# Patient Record
Sex: Female | Born: 1960 | Race: Black or African American | Hispanic: No | Smoking: Former smoker
Health system: Southern US, Community
[De-identification: ages and names within clinical notes are randomized; demographics above are authoritative.]

## PROBLEM LIST (undated history)

## (undated) ENCOUNTER — Emergency Department (HOSPITAL_BASED_OUTPATIENT_CLINIC_OR_DEPARTMENT_OTHER): Admission: EM

## (undated) ENCOUNTER — Inpatient Hospital Stay: Admission: EM | Payer: Self-pay | Source: Home / Self Care

## (undated) DIAGNOSIS — C419 Malignant neoplasm of bone and articular cartilage, unspecified: Secondary | ICD-10-CM

## (undated) DIAGNOSIS — J9859 Other diseases of mediastinum, not elsewhere classified: Secondary | ICD-10-CM

## (undated) DIAGNOSIS — K589 Irritable bowel syndrome without diarrhea: Secondary | ICD-10-CM

## (undated) DIAGNOSIS — R3915 Urgency of urination: Secondary | ICD-10-CM

## (undated) DIAGNOSIS — R002 Palpitations: Secondary | ICD-10-CM

## (undated) DIAGNOSIS — M2550 Pain in unspecified joint: Secondary | ICD-10-CM

## (undated) DIAGNOSIS — G459 Transient cerebral ischemic attack, unspecified: Secondary | ICD-10-CM

## (undated) DIAGNOSIS — I209 Angina pectoris, unspecified: Secondary | ICD-10-CM

## (undated) DIAGNOSIS — R079 Chest pain, unspecified: Secondary | ICD-10-CM

## (undated) DIAGNOSIS — G709 Myoneural disorder, unspecified: Secondary | ICD-10-CM

## (undated) DIAGNOSIS — E079 Disorder of thyroid, unspecified: Secondary | ICD-10-CM

## (undated) DIAGNOSIS — K219 Gastro-esophageal reflux disease without esophagitis: Secondary | ICD-10-CM

## (undated) DIAGNOSIS — J189 Pneumonia, unspecified organism: Secondary | ICD-10-CM

## (undated) DIAGNOSIS — I2699 Other pulmonary embolism without acute cor pulmonale: Secondary | ICD-10-CM

## (undated) DIAGNOSIS — C801 Malignant (primary) neoplasm, unspecified: Secondary | ICD-10-CM

## (undated) DIAGNOSIS — I10 Essential (primary) hypertension: Secondary | ICD-10-CM

## (undated) DIAGNOSIS — D689 Coagulation defect, unspecified: Secondary | ICD-10-CM

## (undated) DIAGNOSIS — M1990 Unspecified osteoarthritis, unspecified site: Secondary | ICD-10-CM

## (undated) DIAGNOSIS — E669 Obesity, unspecified: Secondary | ICD-10-CM

## (undated) DIAGNOSIS — R0602 Shortness of breath: Secondary | ICD-10-CM

## (undated) DIAGNOSIS — R350 Frequency of micturition: Secondary | ICD-10-CM

## (undated) DIAGNOSIS — M069 Rheumatoid arthritis, unspecified: Secondary | ICD-10-CM

## (undated) DIAGNOSIS — H409 Unspecified glaucoma: Secondary | ICD-10-CM

## (undated) DIAGNOSIS — K5900 Constipation, unspecified: Secondary | ICD-10-CM

## (undated) DIAGNOSIS — R600 Localized edema: Secondary | ICD-10-CM

## (undated) DIAGNOSIS — R0600 Dyspnea, unspecified: Secondary | ICD-10-CM

## (undated) DIAGNOSIS — T7840XA Allergy, unspecified, initial encounter: Secondary | ICD-10-CM

## (undated) DIAGNOSIS — F419 Anxiety disorder, unspecified: Secondary | ICD-10-CM

## (undated) DIAGNOSIS — M549 Dorsalgia, unspecified: Secondary | ICD-10-CM

## (undated) DIAGNOSIS — I2510 Atherosclerotic heart disease of native coronary artery without angina pectoris: Secondary | ICD-10-CM

## (undated) DIAGNOSIS — E049 Nontoxic goiter, unspecified: Secondary | ICD-10-CM

## (undated) DIAGNOSIS — E739 Lactose intolerance, unspecified: Secondary | ICD-10-CM

## (undated) DIAGNOSIS — E559 Vitamin D deficiency, unspecified: Secondary | ICD-10-CM

## (undated) DIAGNOSIS — F329 Major depressive disorder, single episode, unspecified: Secondary | ICD-10-CM

## (undated) DIAGNOSIS — I509 Heart failure, unspecified: Secondary | ICD-10-CM

## (undated) DIAGNOSIS — F32A Depression, unspecified: Secondary | ICD-10-CM

## (undated) DIAGNOSIS — I499 Cardiac arrhythmia, unspecified: Secondary | ICD-10-CM

## (undated) DIAGNOSIS — G4730 Sleep apnea, unspecified: Secondary | ICD-10-CM

## (undated) DIAGNOSIS — Z8711 Personal history of peptic ulcer disease: Secondary | ICD-10-CM

## (undated) DIAGNOSIS — E785 Hyperlipidemia, unspecified: Secondary | ICD-10-CM

## (undated) DIAGNOSIS — F1990 Other psychoactive substance use, unspecified, uncomplicated: Secondary | ICD-10-CM

## (undated) DIAGNOSIS — J811 Chronic pulmonary edema: Secondary | ICD-10-CM

## (undated) HISTORY — DX: Localized edema: R60.0

## (undated) HISTORY — DX: Transient cerebral ischemic attack, unspecified: G45.9

## (undated) HISTORY — DX: Shortness of breath: R06.02

## (undated) HISTORY — DX: Constipation, unspecified: K59.00

## (undated) HISTORY — DX: Allergy, unspecified, initial encounter: T78.40XA

## (undated) HISTORY — DX: Dorsalgia, unspecified: M54.9

## (undated) HISTORY — PX: TONSILLECTOMY: SUR1361

## (undated) HISTORY — DX: Chest pain, unspecified: R07.9

## (undated) HISTORY — DX: Palpitations: R00.2

## (undated) HISTORY — DX: Lactose intolerance, unspecified: E73.9

## (undated) HISTORY — DX: Personal history of peptic ulcer disease: Z87.11

## (undated) HISTORY — DX: Myoneural disorder, unspecified: G70.9

## (undated) HISTORY — PX: LEG SURGERY: SHX1003

## (undated) HISTORY — DX: Rheumatoid arthritis, unspecified: M06.9

## (undated) HISTORY — DX: Disorder of thyroid, unspecified: E07.9

## (undated) HISTORY — DX: Hyperlipidemia, unspecified: E78.5

## (undated) HISTORY — DX: Coagulation defect, unspecified: D68.9

## (undated) HISTORY — PX: APPENDECTOMY: SHX54

## (undated) HISTORY — DX: Vitamin D deficiency, unspecified: E55.9

## (undated) HISTORY — DX: Unspecified glaucoma: H40.9

## (undated) HISTORY — DX: Pain in unspecified joint: M25.50

## (undated) HISTORY — DX: Pneumonia, unspecified organism: J18.9

## (undated) HISTORY — DX: Sleep apnea, unspecified: G47.30

## (undated) HISTORY — DX: Irritable bowel syndrome, unspecified: K58.9

## (undated) HISTORY — DX: Other psychoactive substance use, unspecified, uncomplicated: F19.90

## (undated) HISTORY — PX: CARDIAC CATHETERIZATION: SHX172

---

## 1987-04-25 HISTORY — PX: TUBAL LIGATION: SHX77

## 1997-09-25 ENCOUNTER — Ambulatory Visit (HOSPITAL_COMMUNITY): Admission: RE | Admit: 1997-09-25 | Discharge: 1997-09-25 | Payer: Self-pay | Admitting: Family Medicine

## 1997-12-23 ENCOUNTER — Emergency Department (HOSPITAL_COMMUNITY): Admission: EM | Admit: 1997-12-23 | Discharge: 1997-12-23 | Payer: Self-pay | Admitting: Emergency Medicine

## 1998-04-15 ENCOUNTER — Encounter: Admission: RE | Admit: 1998-04-15 | Discharge: 1998-05-03 | Payer: Self-pay | Admitting: Internal Medicine

## 1998-04-28 ENCOUNTER — Ambulatory Visit (HOSPITAL_COMMUNITY): Admission: RE | Admit: 1998-04-28 | Discharge: 1998-04-28 | Payer: Self-pay | Admitting: Internal Medicine

## 1998-04-28 ENCOUNTER — Encounter: Payer: Self-pay | Admitting: Internal Medicine

## 1999-11-04 ENCOUNTER — Ambulatory Visit (HOSPITAL_COMMUNITY): Admission: RE | Admit: 1999-11-04 | Discharge: 1999-11-04 | Payer: Self-pay | Admitting: Internal Medicine

## 1999-11-04 ENCOUNTER — Encounter: Payer: Self-pay | Admitting: Internal Medicine

## 1999-12-25 ENCOUNTER — Emergency Department (HOSPITAL_COMMUNITY): Admission: EM | Admit: 1999-12-25 | Discharge: 1999-12-25 | Payer: Self-pay | Admitting: Emergency Medicine

## 1999-12-30 ENCOUNTER — Other Ambulatory Visit: Admission: RE | Admit: 1999-12-30 | Discharge: 1999-12-30 | Payer: Self-pay | Admitting: Family Medicine

## 2000-06-22 ENCOUNTER — Encounter: Payer: Self-pay | Admitting: Family Medicine

## 2000-06-22 ENCOUNTER — Ambulatory Visit (HOSPITAL_COMMUNITY): Admission: RE | Admit: 2000-06-22 | Discharge: 2000-06-22 | Payer: Self-pay | Admitting: Family Medicine

## 2000-06-22 ENCOUNTER — Ambulatory Visit (HOSPITAL_COMMUNITY): Admission: RE | Admit: 2000-06-22 | Discharge: 2000-06-22 | Payer: Self-pay | Admitting: *Deleted

## 2000-08-16 ENCOUNTER — Ambulatory Visit (HOSPITAL_COMMUNITY): Admission: RE | Admit: 2000-08-16 | Discharge: 2000-08-16 | Payer: Self-pay | Admitting: Family Medicine

## 2000-10-06 ENCOUNTER — Encounter: Payer: Self-pay | Admitting: Emergency Medicine

## 2000-10-06 ENCOUNTER — Inpatient Hospital Stay (HOSPITAL_COMMUNITY): Admission: EM | Admit: 2000-10-06 | Discharge: 2000-10-07 | Payer: Self-pay | Admitting: Emergency Medicine

## 2000-10-07 ENCOUNTER — Encounter: Payer: Self-pay | Admitting: Internal Medicine

## 2000-10-16 ENCOUNTER — Encounter: Admission: RE | Admit: 2000-10-16 | Discharge: 2000-10-16 | Payer: Self-pay | Admitting: Internal Medicine

## 2001-01-29 ENCOUNTER — Encounter: Payer: Self-pay | Admitting: Thoracic Surgery

## 2001-01-29 ENCOUNTER — Encounter: Admission: RE | Admit: 2001-01-29 | Discharge: 2001-01-29 | Payer: Self-pay | Admitting: Thoracic Surgery

## 2001-04-06 ENCOUNTER — Emergency Department (HOSPITAL_COMMUNITY): Admission: EM | Admit: 2001-04-06 | Discharge: 2001-04-07 | Payer: Self-pay | Admitting: Emergency Medicine

## 2001-04-06 ENCOUNTER — Encounter: Payer: Self-pay | Admitting: Emergency Medicine

## 2001-04-11 ENCOUNTER — Ambulatory Visit (HOSPITAL_COMMUNITY): Admission: RE | Admit: 2001-04-11 | Discharge: 2001-04-11 | Payer: Self-pay | Admitting: Family Medicine

## 2001-04-11 ENCOUNTER — Encounter: Payer: Self-pay | Admitting: Family Medicine

## 2001-07-23 ENCOUNTER — Ambulatory Visit (HOSPITAL_BASED_OUTPATIENT_CLINIC_OR_DEPARTMENT_OTHER): Admission: RE | Admit: 2001-07-23 | Discharge: 2001-07-23 | Payer: Self-pay | Admitting: Family Medicine

## 2001-07-31 ENCOUNTER — Encounter: Admission: RE | Admit: 2001-07-31 | Discharge: 2001-07-31 | Payer: Self-pay | Admitting: Thoracic Surgery

## 2001-07-31 ENCOUNTER — Encounter: Payer: Self-pay | Admitting: Thoracic Surgery

## 2001-09-25 ENCOUNTER — Encounter: Payer: Self-pay | Admitting: Obstetrics and Gynecology

## 2001-09-25 ENCOUNTER — Inpatient Hospital Stay (HOSPITAL_COMMUNITY): Admission: AD | Admit: 2001-09-25 | Discharge: 2001-09-25 | Payer: Self-pay | Admitting: Obstetrics and Gynecology

## 2001-10-01 ENCOUNTER — Encounter: Admission: RE | Admit: 2001-10-01 | Discharge: 2001-10-01 | Payer: Self-pay | Admitting: *Deleted

## 2001-11-18 ENCOUNTER — Inpatient Hospital Stay (HOSPITAL_COMMUNITY): Admission: RE | Admit: 2001-11-18 | Discharge: 2001-11-20 | Payer: Self-pay | Admitting: Obstetrics and Gynecology

## 2001-11-18 ENCOUNTER — Encounter (INDEPENDENT_AMBULATORY_CARE_PROVIDER_SITE_OTHER): Payer: Self-pay | Admitting: Specialist

## 2001-12-05 ENCOUNTER — Encounter: Admission: RE | Admit: 2001-12-05 | Discharge: 2001-12-05 | Payer: Self-pay | Admitting: Obstetrics and Gynecology

## 2001-12-24 ENCOUNTER — Encounter: Admission: RE | Admit: 2001-12-24 | Discharge: 2001-12-24 | Payer: Self-pay | Admitting: *Deleted

## 2002-01-29 ENCOUNTER — Encounter: Payer: Self-pay | Admitting: Thoracic Surgery

## 2002-01-29 ENCOUNTER — Encounter: Admission: RE | Admit: 2002-01-29 | Discharge: 2002-01-29 | Payer: Self-pay | Admitting: Thoracic Surgery

## 2002-04-27 ENCOUNTER — Emergency Department (HOSPITAL_COMMUNITY): Admission: EM | Admit: 2002-04-27 | Discharge: 2002-04-27 | Payer: Self-pay | Admitting: Emergency Medicine

## 2002-04-28 ENCOUNTER — Encounter: Payer: Self-pay | Admitting: Emergency Medicine

## 2002-06-05 ENCOUNTER — Encounter: Admission: RE | Admit: 2002-06-05 | Discharge: 2002-06-05 | Payer: Self-pay | Admitting: Obstetrics and Gynecology

## 2002-06-22 ENCOUNTER — Emergency Department (HOSPITAL_COMMUNITY): Admission: EM | Admit: 2002-06-22 | Discharge: 2002-06-22 | Payer: Self-pay | Admitting: Emergency Medicine

## 2002-07-22 ENCOUNTER — Encounter: Payer: Self-pay | Admitting: Family Medicine

## 2002-07-22 ENCOUNTER — Ambulatory Visit (HOSPITAL_COMMUNITY): Admission: RE | Admit: 2002-07-22 | Discharge: 2002-07-22 | Payer: Self-pay | Admitting: Family Medicine

## 2002-08-07 ENCOUNTER — Encounter: Admission: RE | Admit: 2002-08-07 | Discharge: 2002-08-07 | Payer: Self-pay | Admitting: Obstetrics and Gynecology

## 2002-08-12 ENCOUNTER — Encounter: Payer: Self-pay | Admitting: Thoracic Surgery

## 2002-08-12 ENCOUNTER — Encounter: Admission: RE | Admit: 2002-08-12 | Discharge: 2002-08-12 | Payer: Self-pay | Admitting: Thoracic Surgery

## 2002-12-25 ENCOUNTER — Emergency Department (HOSPITAL_COMMUNITY): Admission: EM | Admit: 2002-12-25 | Discharge: 2002-12-25 | Payer: Self-pay | Admitting: *Deleted

## 2003-02-13 ENCOUNTER — Encounter: Payer: Self-pay | Admitting: Internal Medicine

## 2003-02-13 ENCOUNTER — Ambulatory Visit (HOSPITAL_COMMUNITY): Admission: RE | Admit: 2003-02-13 | Discharge: 2003-02-13 | Payer: Self-pay | Admitting: Internal Medicine

## 2003-02-19 ENCOUNTER — Encounter: Admission: RE | Admit: 2003-02-19 | Discharge: 2003-02-19 | Payer: Self-pay | Admitting: Thoracic Surgery

## 2003-04-16 ENCOUNTER — Ambulatory Visit (HOSPITAL_COMMUNITY): Admission: RE | Admit: 2003-04-16 | Discharge: 2003-04-16 | Payer: Self-pay | Admitting: Nurse Practitioner

## 2003-04-25 HISTORY — PX: ABDOMINAL HYSTERECTOMY: SHX81

## 2003-05-25 ENCOUNTER — Ambulatory Visit (HOSPITAL_COMMUNITY): Admission: RE | Admit: 2003-05-25 | Discharge: 2003-05-25 | Payer: Self-pay | Admitting: Family Medicine

## 2003-07-15 ENCOUNTER — Emergency Department (HOSPITAL_COMMUNITY): Admission: EM | Admit: 2003-07-15 | Discharge: 2003-07-15 | Payer: Self-pay | Admitting: Emergency Medicine

## 2003-07-17 ENCOUNTER — Emergency Department (HOSPITAL_COMMUNITY): Admission: EM | Admit: 2003-07-17 | Discharge: 2003-07-17 | Payer: Self-pay | Admitting: Emergency Medicine

## 2003-07-23 ENCOUNTER — Emergency Department (HOSPITAL_COMMUNITY): Admission: EM | Admit: 2003-07-23 | Discharge: 2003-07-23 | Payer: Self-pay | Admitting: Family Medicine

## 2003-08-13 ENCOUNTER — Encounter: Admission: RE | Admit: 2003-08-13 | Discharge: 2003-10-09 | Payer: Self-pay | Admitting: Orthopedic Surgery

## 2003-08-20 ENCOUNTER — Encounter: Admission: RE | Admit: 2003-08-20 | Discharge: 2003-08-20 | Payer: Self-pay | Admitting: Thoracic Surgery

## 2003-08-25 ENCOUNTER — Encounter: Admission: RE | Admit: 2003-08-25 | Discharge: 2003-08-25 | Payer: Self-pay | Admitting: Orthopedic Surgery

## 2003-09-09 ENCOUNTER — Encounter: Admission: RE | Admit: 2003-09-09 | Discharge: 2003-09-09 | Payer: Self-pay | Admitting: Orthopedic Surgery

## 2003-09-14 ENCOUNTER — Ambulatory Visit (HOSPITAL_COMMUNITY): Admission: RE | Admit: 2003-09-14 | Discharge: 2003-09-14 | Payer: Self-pay | Admitting: Family Medicine

## 2003-10-09 ENCOUNTER — Encounter: Admission: RE | Admit: 2003-10-09 | Discharge: 2004-01-07 | Payer: Self-pay | Admitting: Orthopedic Surgery

## 2004-02-01 ENCOUNTER — Ambulatory Visit: Payer: Self-pay | Admitting: Family Medicine

## 2004-03-03 ENCOUNTER — Encounter: Admission: RE | Admit: 2004-03-03 | Discharge: 2004-03-03 | Payer: Self-pay | Admitting: Thoracic Surgery

## 2004-04-01 ENCOUNTER — Ambulatory Visit: Payer: Self-pay | Admitting: Family Medicine

## 2004-04-01 ENCOUNTER — Other Ambulatory Visit: Admission: RE | Admit: 2004-04-01 | Discharge: 2004-04-01 | Payer: Self-pay | Admitting: Family Medicine

## 2004-05-31 ENCOUNTER — Ambulatory Visit: Payer: Self-pay | Admitting: Family Medicine

## 2004-06-21 ENCOUNTER — Ambulatory Visit: Payer: Self-pay | Admitting: Family Medicine

## 2004-07-14 ENCOUNTER — Ambulatory Visit: Payer: Self-pay | Admitting: Obstetrics and Gynecology

## 2004-07-17 ENCOUNTER — Emergency Department (HOSPITAL_COMMUNITY): Admission: EM | Admit: 2004-07-17 | Discharge: 2004-07-17 | Payer: Self-pay | Admitting: Family Medicine

## 2004-08-10 ENCOUNTER — Ambulatory Visit: Payer: Self-pay | Admitting: Family Medicine

## 2004-08-12 ENCOUNTER — Ambulatory Visit: Payer: Self-pay | Admitting: Family Medicine

## 2004-08-15 ENCOUNTER — Ambulatory Visit (HOSPITAL_COMMUNITY): Admission: RE | Admit: 2004-08-15 | Discharge: 2004-08-15 | Payer: Self-pay | Admitting: Family Medicine

## 2004-08-15 ENCOUNTER — Ambulatory Visit: Payer: Self-pay | Admitting: Family Medicine

## 2004-08-25 ENCOUNTER — Ambulatory Visit: Payer: Self-pay | Admitting: Family Medicine

## 2004-08-31 ENCOUNTER — Emergency Department (HOSPITAL_COMMUNITY): Admission: EM | Admit: 2004-08-31 | Discharge: 2004-08-31 | Payer: Self-pay | Admitting: Family Medicine

## 2004-10-06 ENCOUNTER — Ambulatory Visit: Payer: Self-pay | Admitting: Family Medicine

## 2004-10-18 ENCOUNTER — Ambulatory Visit: Payer: Self-pay | Admitting: Family Medicine

## 2004-10-24 ENCOUNTER — Ambulatory Visit: Payer: Self-pay | Admitting: Family Medicine

## 2004-10-28 ENCOUNTER — Ambulatory Visit: Payer: Self-pay | Admitting: Family Medicine

## 2004-11-04 ENCOUNTER — Ambulatory Visit: Payer: Self-pay | Admitting: Internal Medicine

## 2004-12-16 ENCOUNTER — Emergency Department (HOSPITAL_COMMUNITY): Admission: EM | Admit: 2004-12-16 | Discharge: 2004-12-16 | Payer: Self-pay | Admitting: Emergency Medicine

## 2004-12-24 IMAGING — CT CT CHEST W/ CM
1 of 2 series · 14 of 29 positions shown, 18 images · IV contrast (omnipaque)
Comparison: none

CLINICAL DATA: Follow up of anterior mediastinal mass.  Con v14.8, 493.9. 
 CT CHEST W/CONTRAST
 Multidetector helical scans through the chest were performed after IV contrast media was given.  222cc of Omnipaque 300 were given as the contrast media to this patient with a history of asthma.  This scan is compared to the prior CT from [REDACTED] dated 08/12/02.  
 The rounded anterior superior mediastinal soft tissue mass is stable measuring 20 x 18mm.  No mediastinal or hilar adenopathy is seen.  No axillary adenopathy is noted.  The pulmonary arteries and thoracic aorta opacify normally.  There is suggestion of mild fatty infiltration of the liver.  On lung window images no lung parenchymal lesion is seen.  No effusion is noted. 
 IMPRESSION
 Stable anterior superior mediastinal soft tissue mass when compared to prior study.  No adenopathy.

[Series 2: — · axial · 0.62mm/px · z∈[-294,-44]mm · 14 of 59 slices shown, 18 images]
[im 5/59  mediastinal]
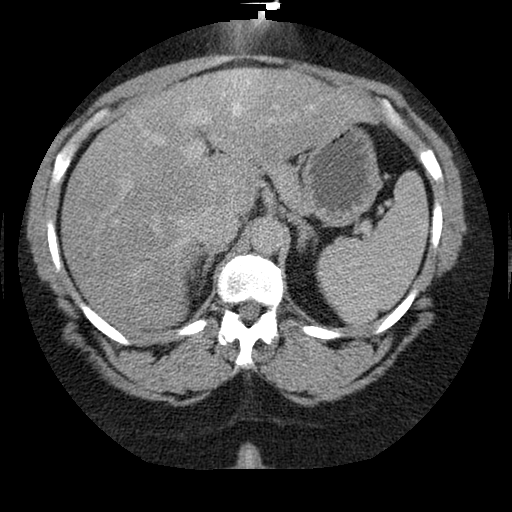
[im 5/59  lung]
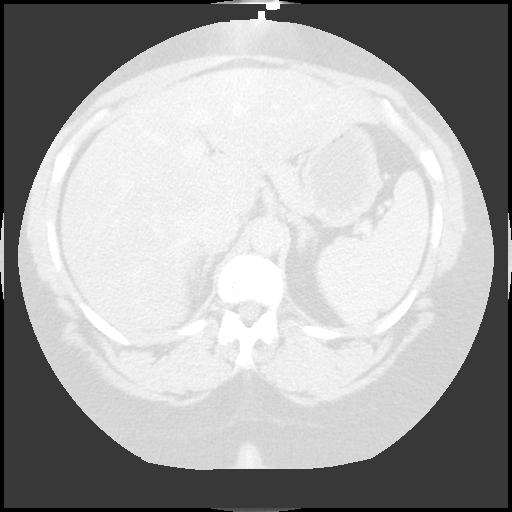
[im 9/59  lung]
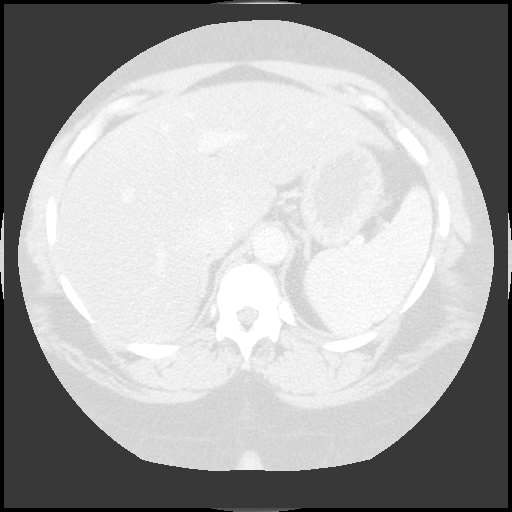
[im 13/59  lung]
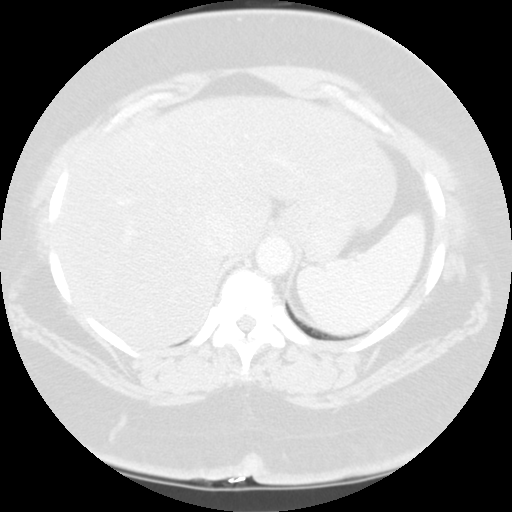
[im 17/59  lung]
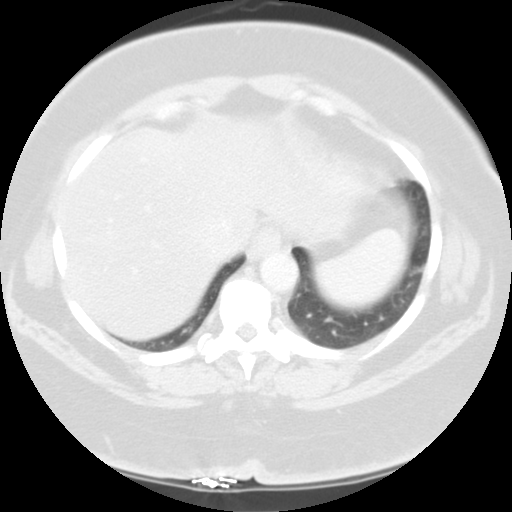
[im 21/59  mediastinal]
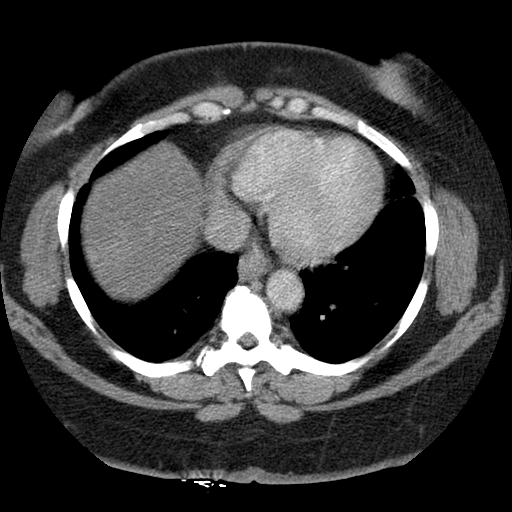
[im 21/59  lung]
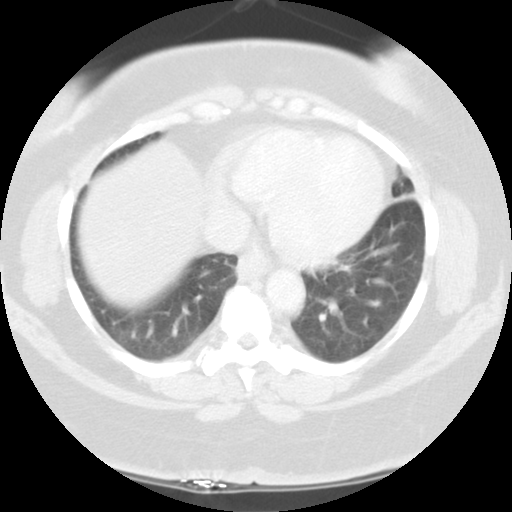
[im 25/59  lung]
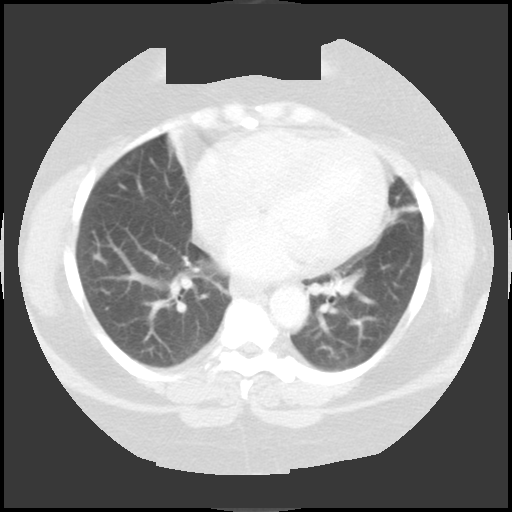
[im 29/59  lung]
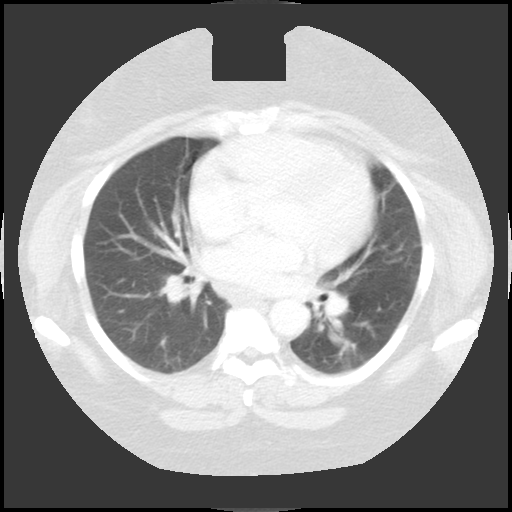
[im 30/59  lung]
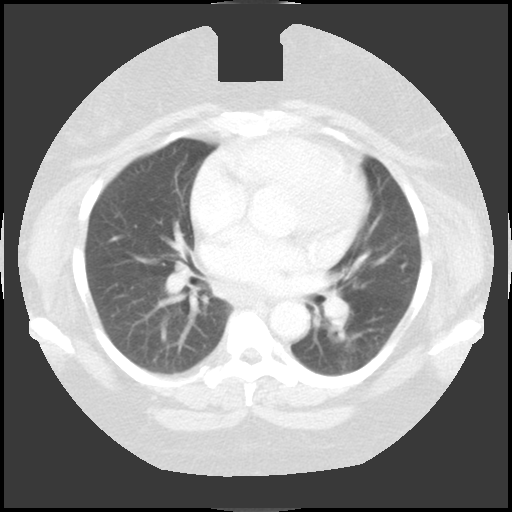
[im 34/59  mediastinal]
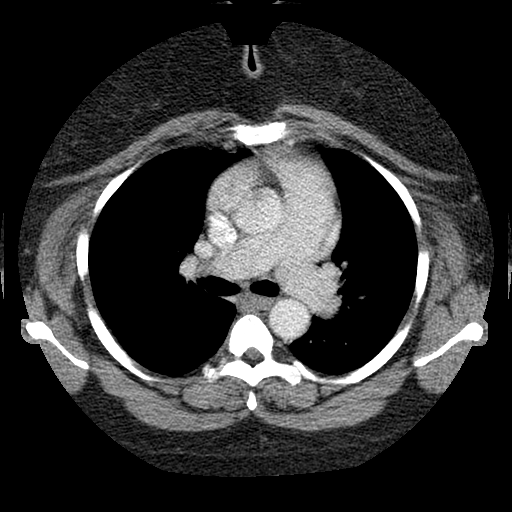
[im 34/59  lung]
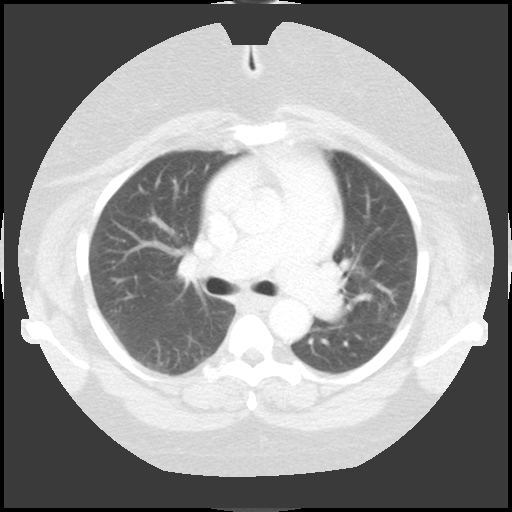
[im 38/59  lung]
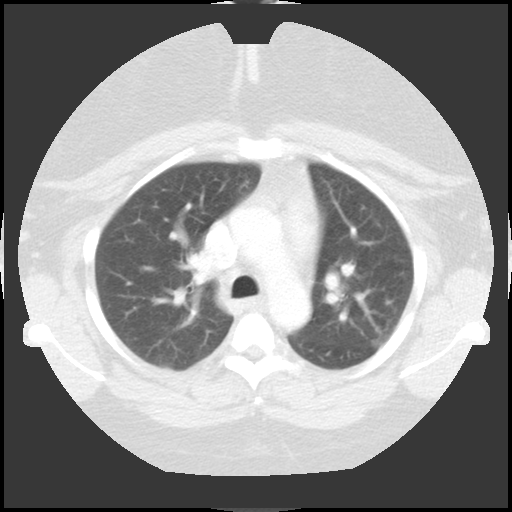
[im 42/59  lung]
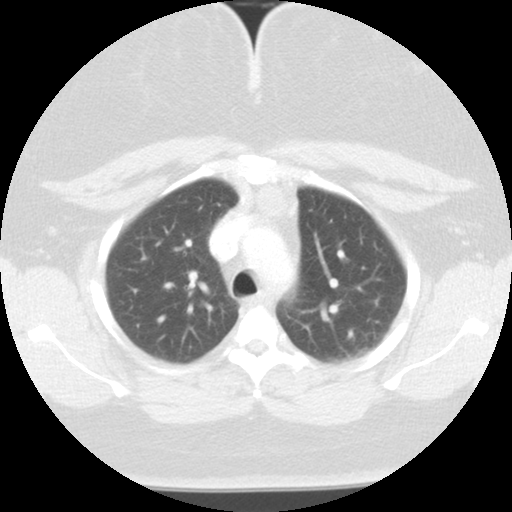
[im 46/59  lung]
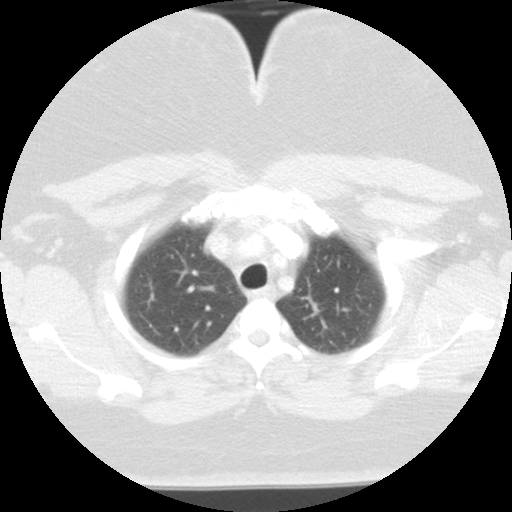
[im 50/59  mediastinal]
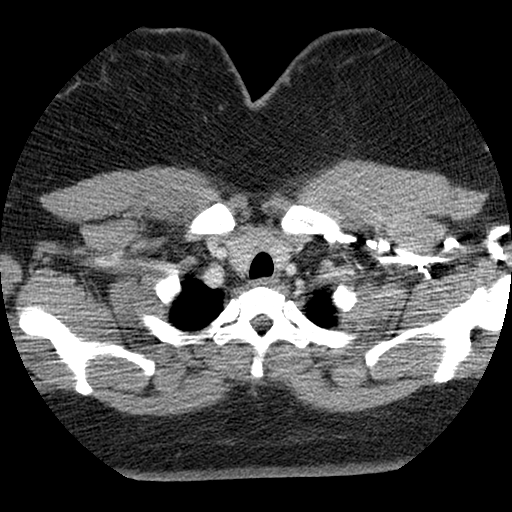
[im 50/59  lung]
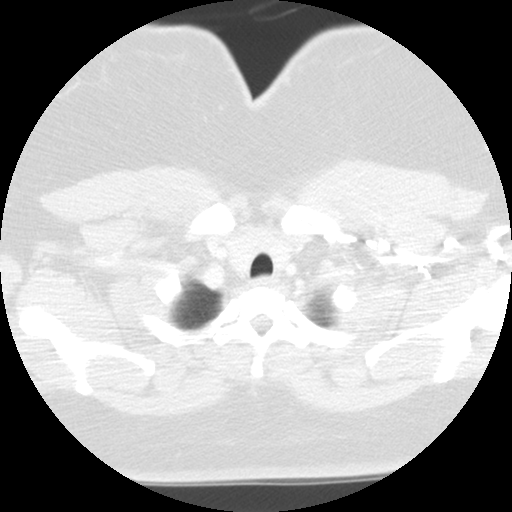
[im 54/59  lung]
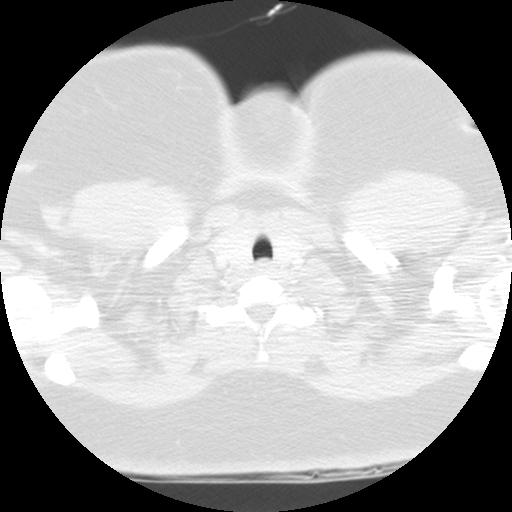

[14 of 29 positions shown; findings below may reference images not displayed]

## 2005-01-27 ENCOUNTER — Emergency Department (HOSPITAL_COMMUNITY): Admission: EM | Admit: 2005-01-27 | Discharge: 2005-01-27 | Payer: Self-pay | Admitting: Emergency Medicine

## 2005-02-12 ENCOUNTER — Emergency Department (HOSPITAL_COMMUNITY): Admission: EM | Admit: 2005-02-12 | Discharge: 2005-02-12 | Payer: Self-pay | Admitting: Emergency Medicine

## 2005-02-13 ENCOUNTER — Ambulatory Visit: Payer: Self-pay | Admitting: Family Medicine

## 2005-02-22 ENCOUNTER — Encounter: Admission: RE | Admit: 2005-02-22 | Discharge: 2005-02-22 | Payer: Self-pay | Admitting: Thoracic Surgery

## 2005-03-22 ENCOUNTER — Ambulatory Visit: Payer: Self-pay | Admitting: Family Medicine

## 2005-03-29 IMAGING — CR DG LUMBAR SPINE COMPLETE 4+V
5 series · 5 of 5 positions shown · non-contrast
Comparison: none

CLINICAL DATA: Low back pain with radicular symptoms.
 PLAIN FILM EXAMINATION OF THE LUMBAR SPINE INCLUDING BILATERAL OBLIQUE VIEWS (FOUR FILMS, FIVE VIEWS)  
 No comparisons.
 Normal alignment of the lumbar spine with slight L4-5 disc space narrowing.  Very mild levoscoliosis lower lumbar spine.  No pars defect.  Mild bilateral facet joint degenerative changes at L4-5 and L5-S1.  Question left renal calculus incompletely evaluated on present exam.
 IMPRESSION
 Mild levoscoliosis lower lumbar spine. 
 Slight disc space narrowing L4-5 level.
 Mild bilateral L4-5 and L5-S1 facet joint degenerative changes.
 Question left renal calculus incompletely evaluated on present exam.  It is possible this represents overlapping ribs. 
 SACROILIAC JOINTS
 Very mild sclerosis surrounds the sacroiliac joints, most notable along the ilium.  The joint spaces remain preserved without significant erosion.  
 Mild sclerosis surrounds sacroiliac joints which otherwise appear intact.

[view not recorded (1 of 5)]
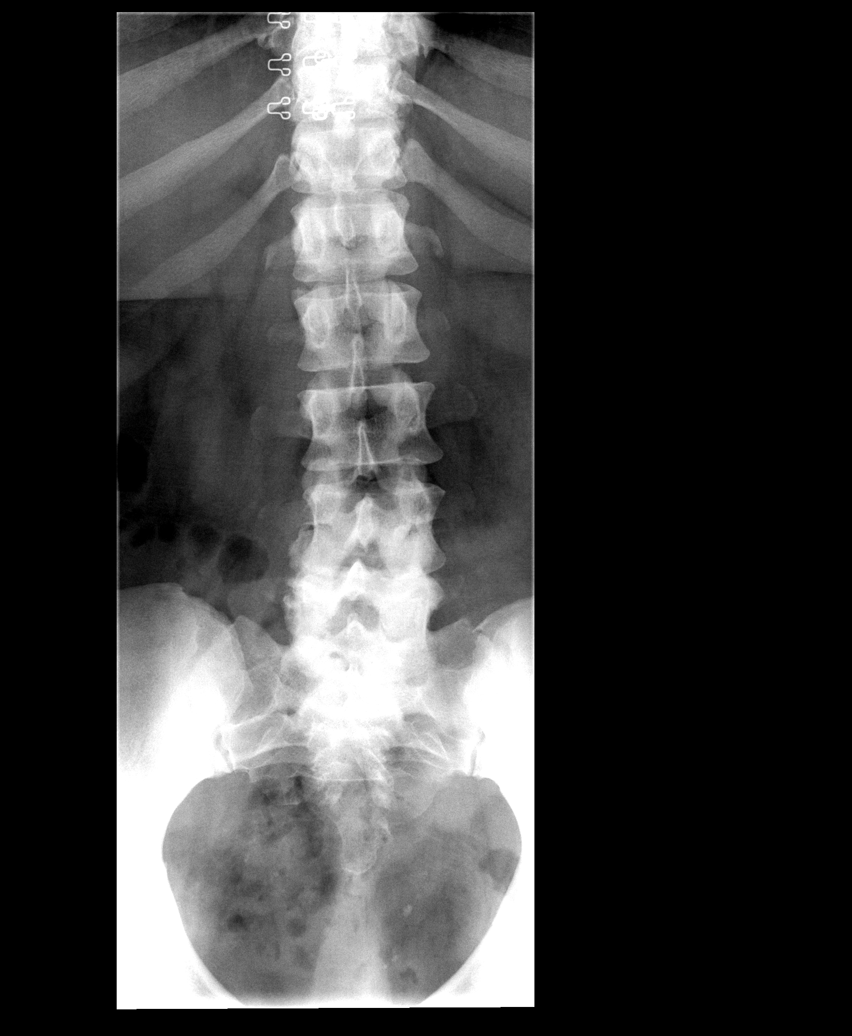

[view not recorded (2 of 5)]
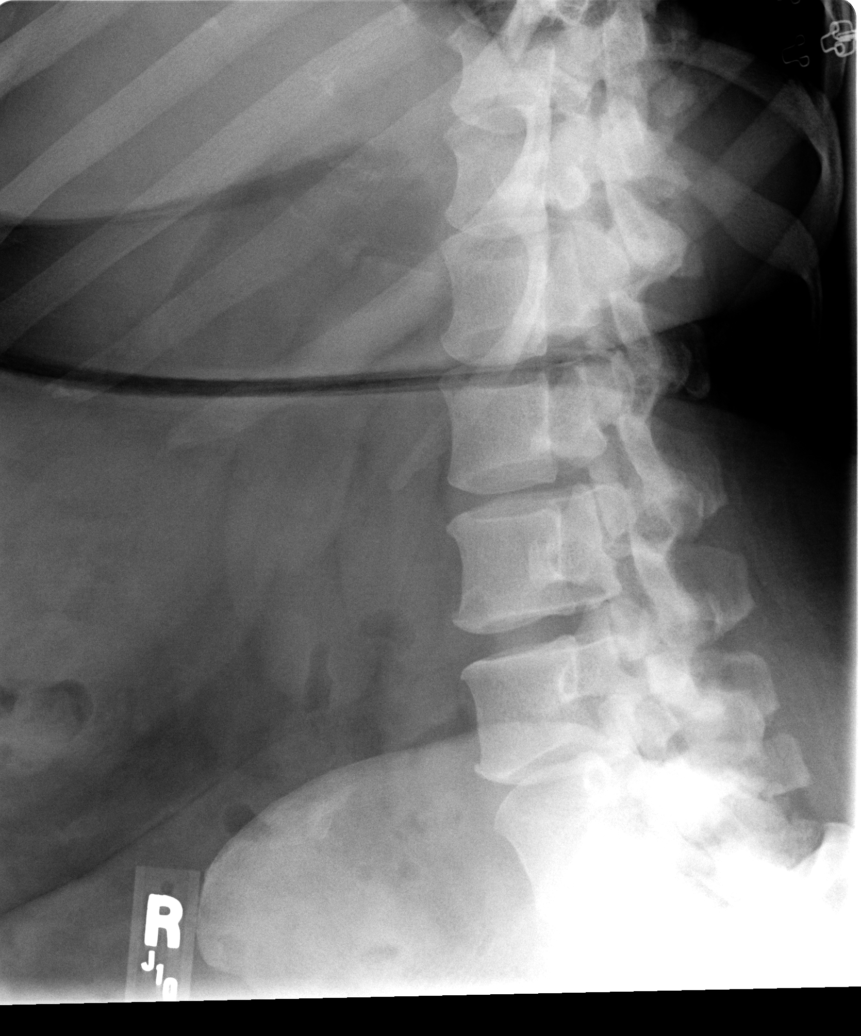

[view not recorded (3 of 5)]
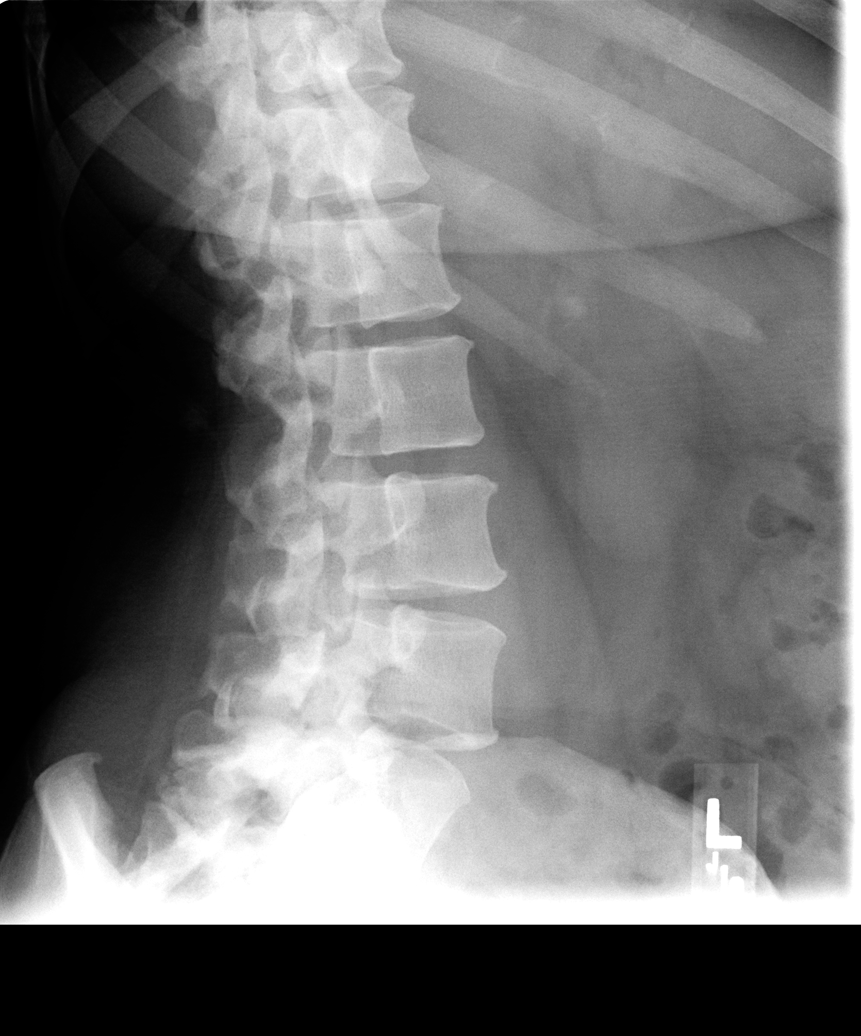

[view not recorded (4 of 5)]
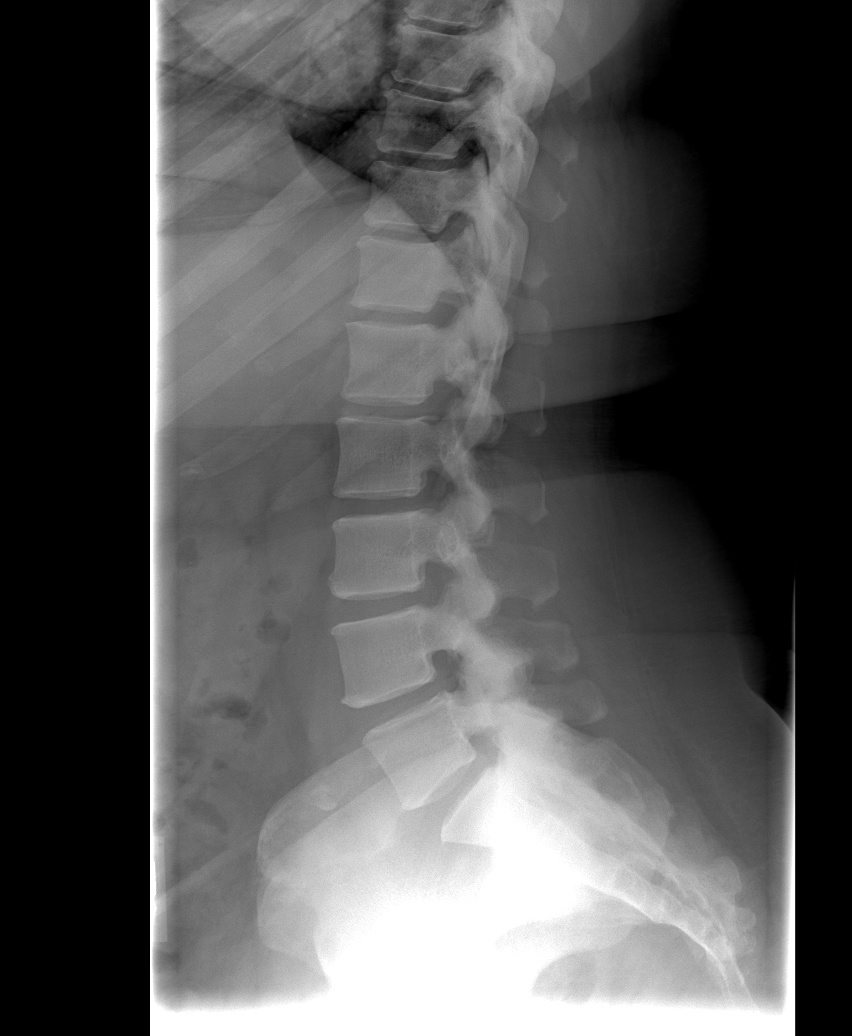

[view not recorded (5 of 5)]
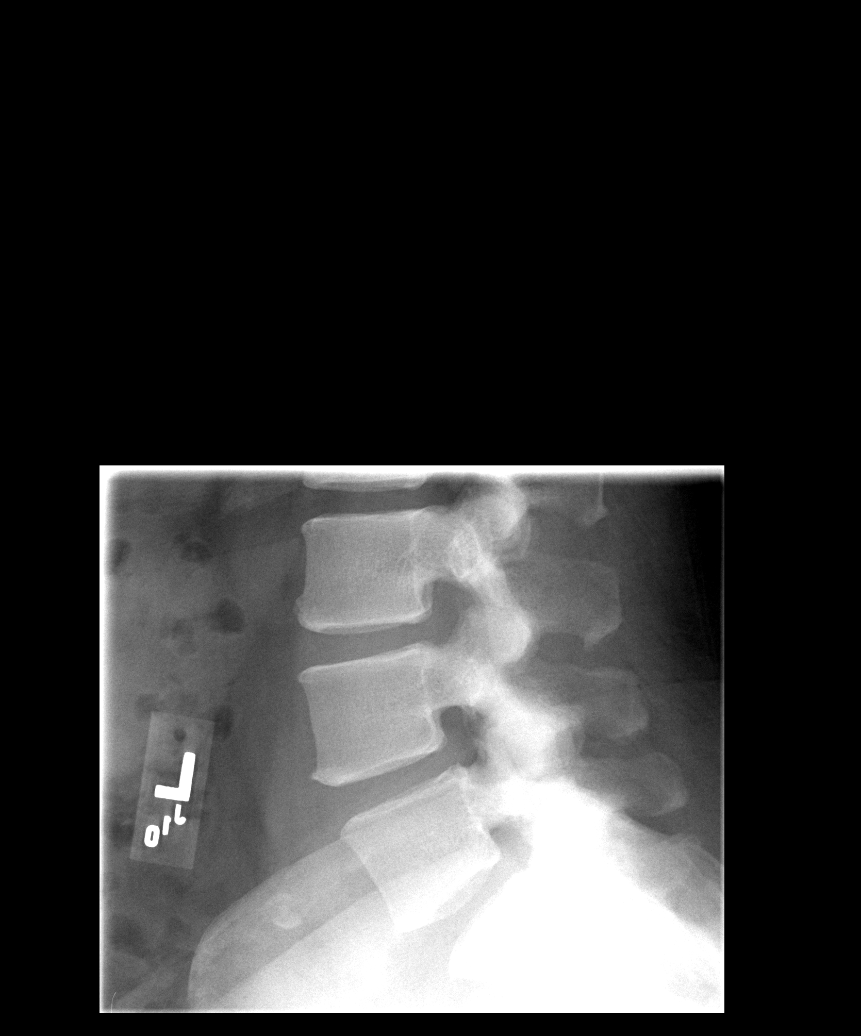

[5 of 5 positions shown; findings below may reference images not displayed]

## 2005-03-29 IMAGING — CR DG SI JOINTS 3+V
3 series · 3 of 3 positions shown · non-contrast
Comparison: none

CLINICAL DATA: Low back pain with radicular symptoms.
 PLAIN FILM EXAMINATION OF THE LUMBAR SPINE INCLUDING BILATERAL OBLIQUE VIEWS (FOUR FILMS, FIVE VIEWS)  
 No comparisons.
 Normal alignment of the lumbar spine with slight L4-5 disc space narrowing.  Very mild levoscoliosis lower lumbar spine.  No pars defect.  Mild bilateral facet joint degenerative changes at L4-5 and L5-S1.  Question left renal calculus incompletely evaluated on present exam.
 IMPRESSION
 Mild levoscoliosis lower lumbar spine. 
 Slight disc space narrowing L4-5 level.
 Mild bilateral L4-5 and L5-S1 facet joint degenerative changes.
 Question left renal calculus incompletely evaluated on present exam.  It is possible this represents overlapping ribs. 
 SACROILIAC JOINTS
 Very mild sclerosis surrounds the sacroiliac joints, most notable along the ilium.  The joint spaces remain preserved without significant erosion.  
 Mild sclerosis surrounds sacroiliac joints which otherwise appear intact.

[view not recorded (1 of 3)]
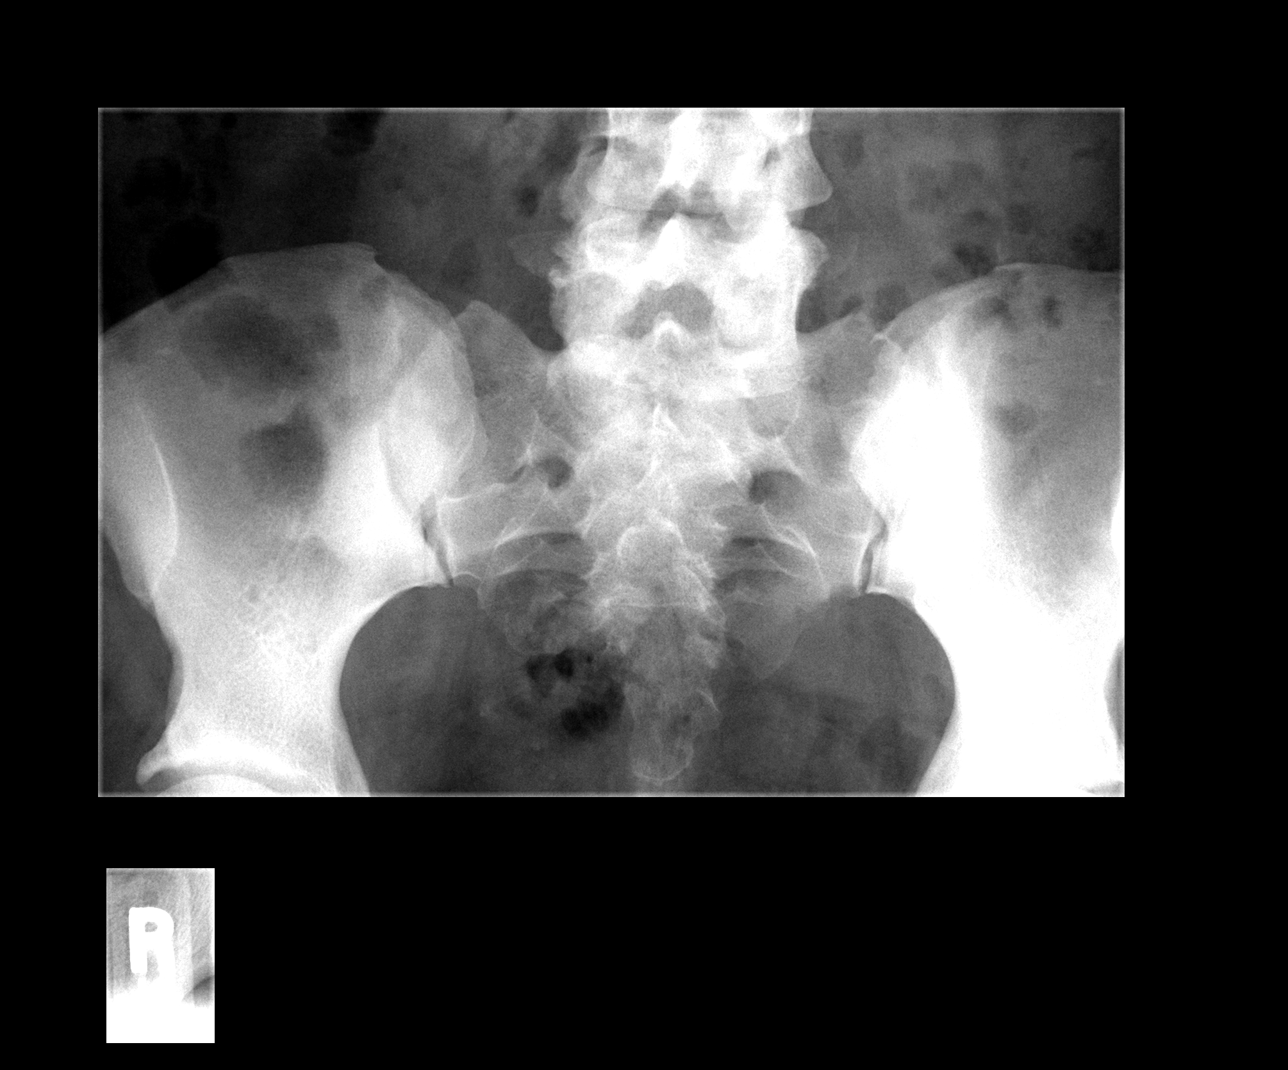

[view not recorded (2 of 3)]
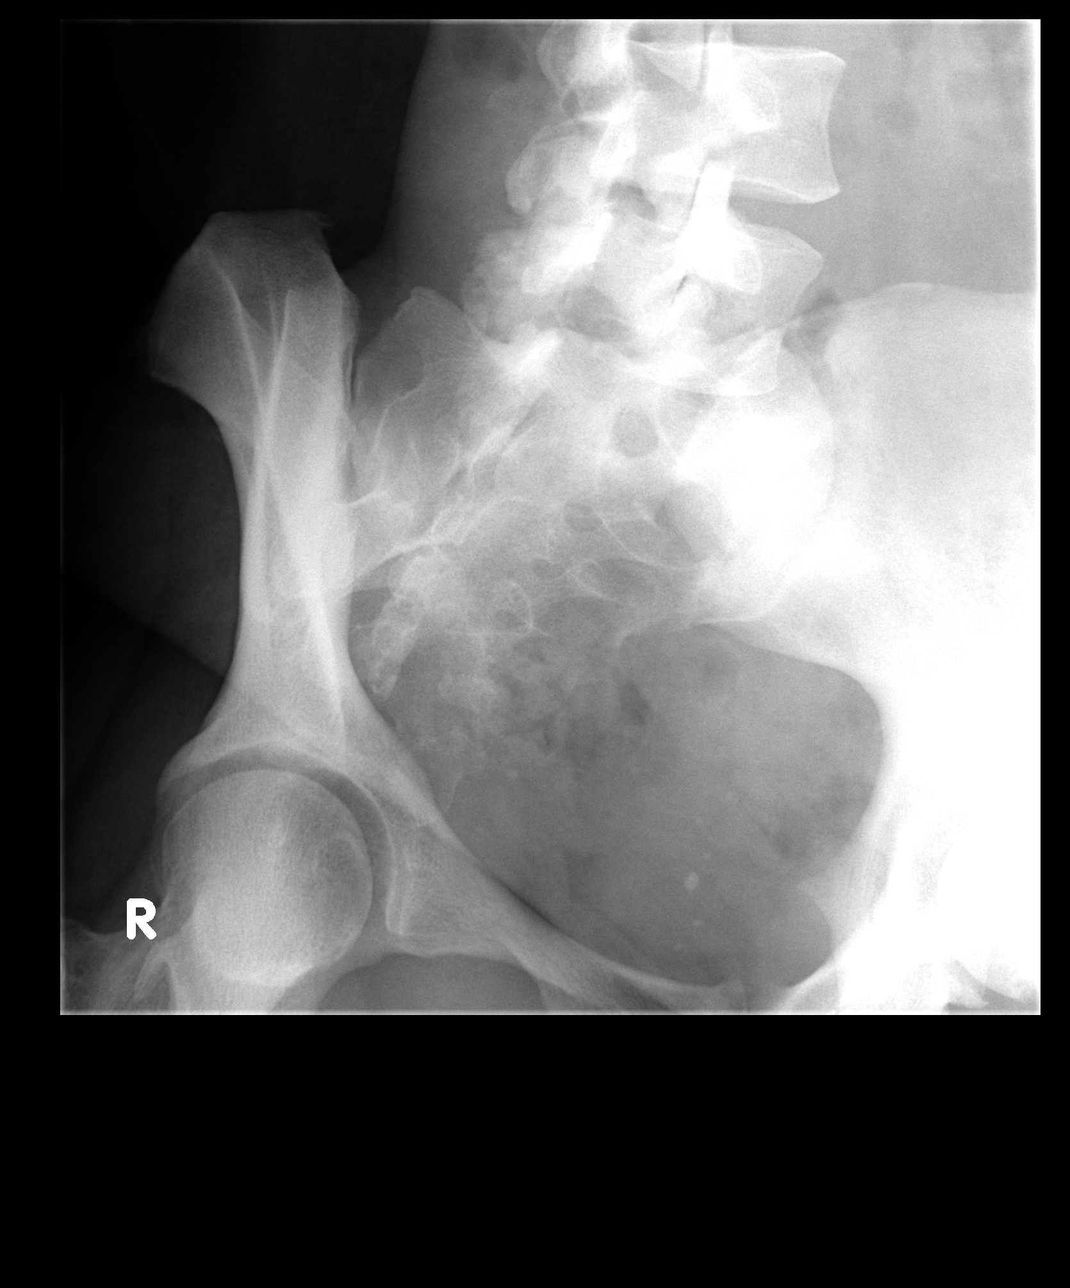

[view not recorded (3 of 3)]
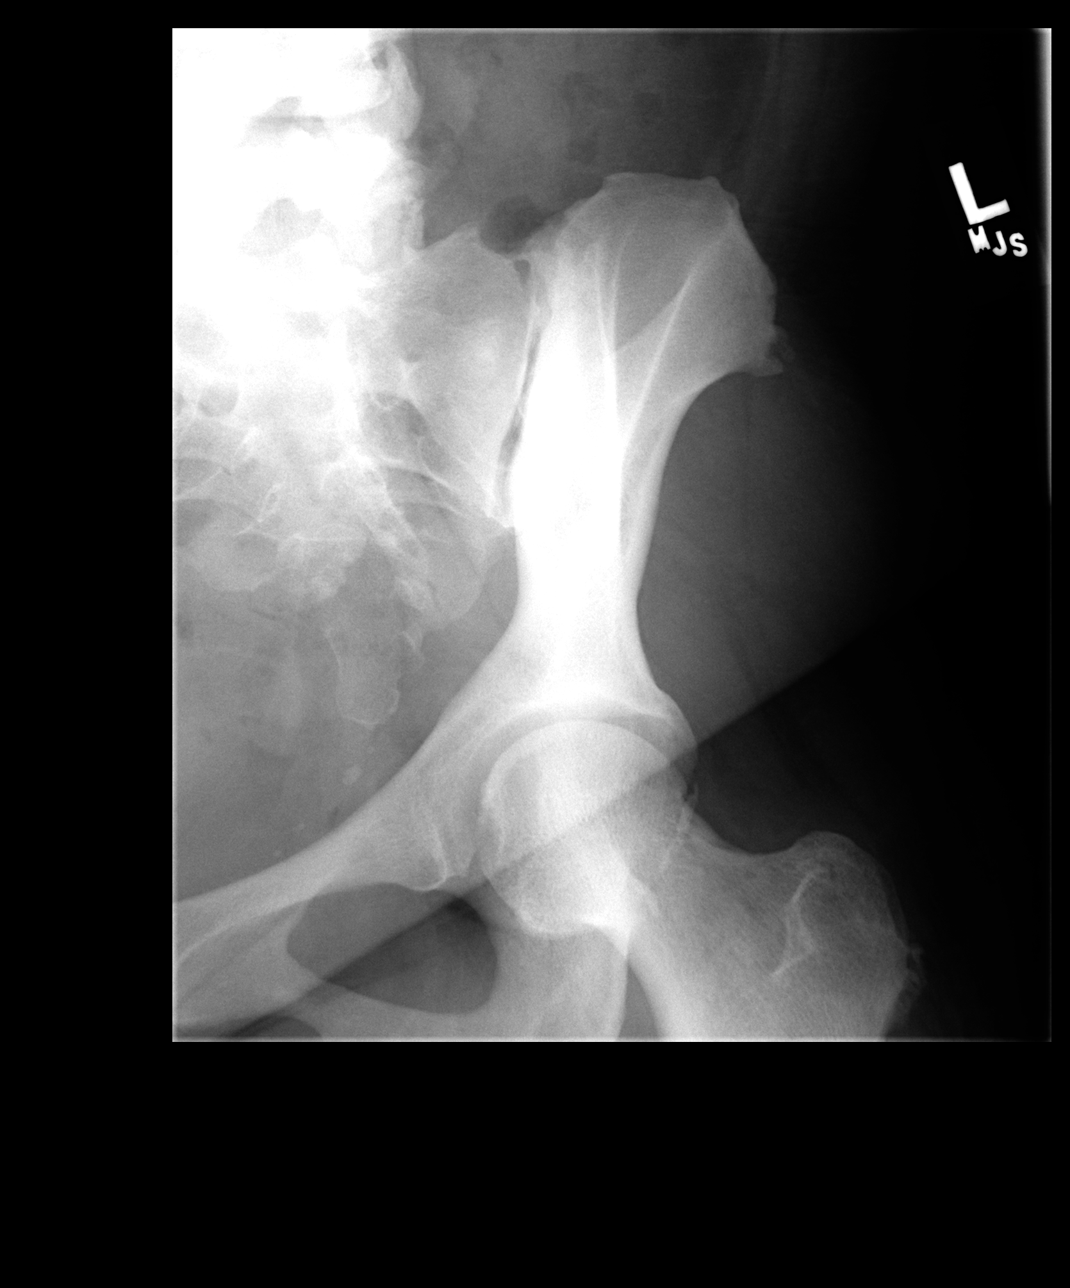

[3 of 3 positions shown; findings below may reference images not displayed]

## 2005-04-25 ENCOUNTER — Ambulatory Visit: Payer: Self-pay | Admitting: Family Medicine

## 2005-04-26 ENCOUNTER — Ambulatory Visit (HOSPITAL_COMMUNITY): Admission: RE | Admit: 2005-04-26 | Discharge: 2005-04-26 | Payer: Self-pay | Admitting: Family Medicine

## 2005-05-08 ENCOUNTER — Ambulatory Visit: Payer: Self-pay | Admitting: Family Medicine

## 2005-05-09 ENCOUNTER — Ambulatory Visit (HOSPITAL_COMMUNITY): Admission: RE | Admit: 2005-05-09 | Discharge: 2005-05-09 | Payer: Self-pay | Admitting: Internal Medicine

## 2005-05-19 IMAGING — MR MR LUMBAR SPINE W/O CM
4 of 7 series · 19 of 48 positions shown · non-contrast
Comparison: none

CLINICAL DATA: Back and right leg pain.  Heavily sedated.  Claustrophobic.  
 MR LUMBAR SPINE 
 Multiplanar T1- and T2-weighted images were obtained without contrast.  There is a mild scoliosis within the lumbar spine convexed left in the mid to lower lumbar region.  There is good disc height and hydration throughout.  Vertebral body alignment is satisfactory.  Marrow signal is homogenous throughout.  Conus medullaris is within normal limits.  Individual disc spaces are examined as follows:
 L1-2:  Mild facet arthropathy.  No stenosis or disc protrusion.
 L2-3:  Moderate facet arthropathy.  No stenosis or disc protrusion.
 L3-4:  Moderate facet arthropathy.  Mild asymmetric foraminal narrowing on the right but no L3 or L4 nerve root encroachment.
 L4-5:  Advanced facet arthropathy right worse than left.  Asymmetric foraminal narrowing on the right related to annular bulging and bony overgrowth.  No definite L4 or L5 nerve root encroachment however. 
 L5-S1:  Asymmetric facet arthropathy right worse than left.  No stenosis or disc protrusion.  No L5 or S1 nerve root encroachment. 
 IMPRESSION
 Advanced lower lumbar facet arthropathy worst at L4-5 and L5-S1.  
 Moderate scoliotic change with asymmetric foraminal narrowing and disc space narrowing worst at L3-4 and L4-5 on the right.
 No evidence for significant lateral recess encroachment, focal disc protrusion or foraminal narrowing which might significantly compress the exiting right-sided nerve roots.

[Series 3: T1 · sagittal · 4.0mm · 0.51mm/px · 4 of 12 slices shown]
[im 1/12]
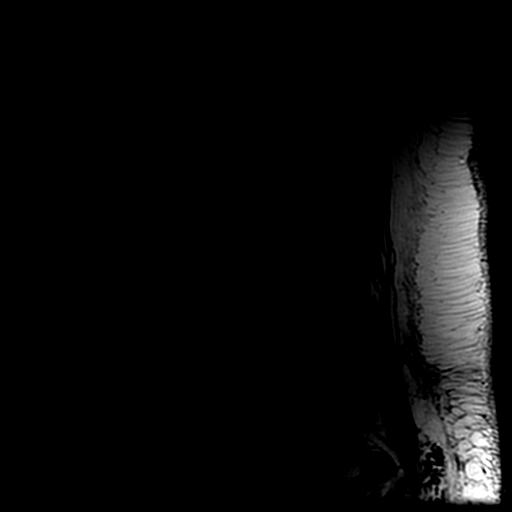
[im 3/12]
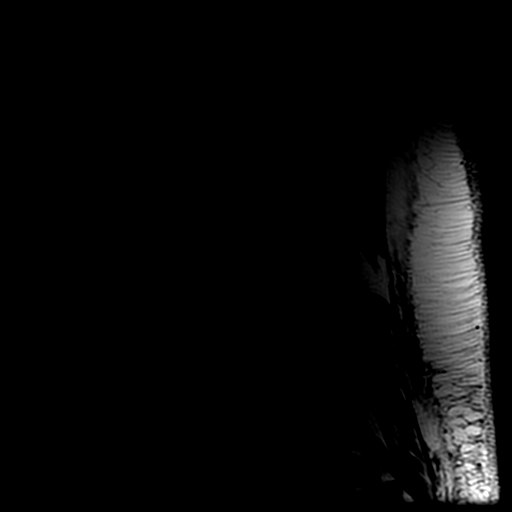
[im 6/12]
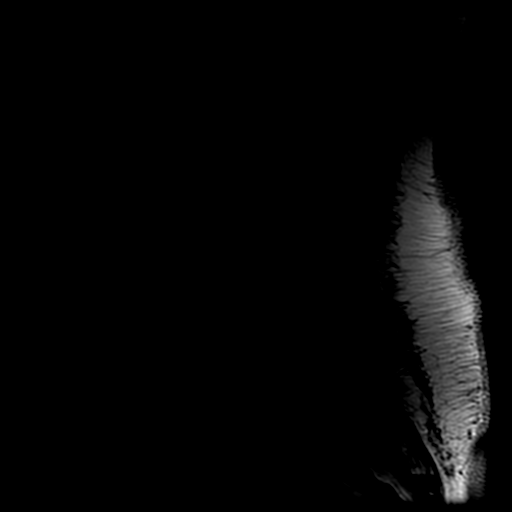
[im 12/12]
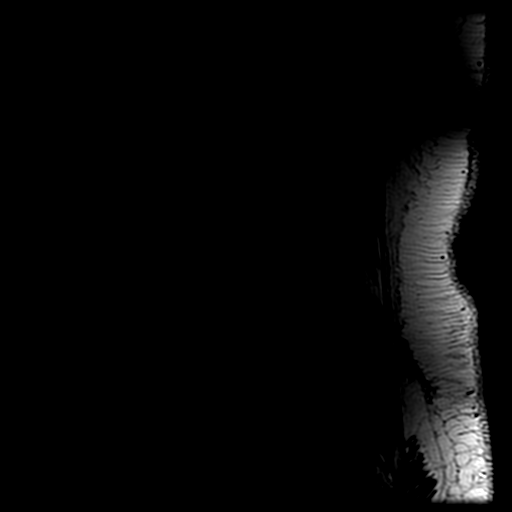

[Series 4: T2 · sagittal · 4.0mm · 0.51mm/px · 5 of 12 slices shown (1 of 2)]
[im 1/12]
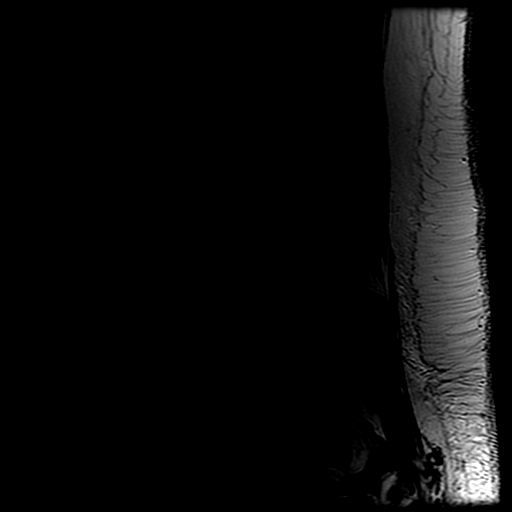
[im 3/12]
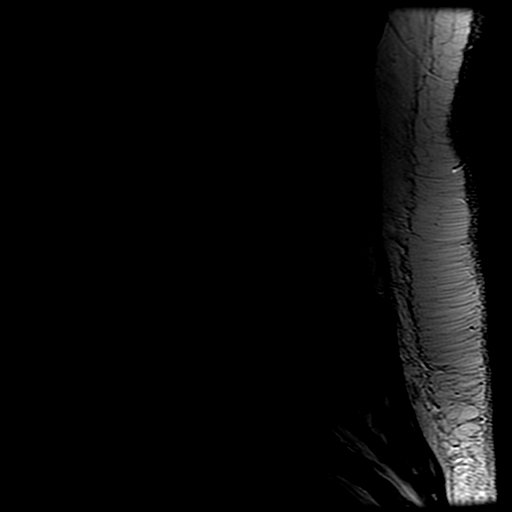
[im 6/12]
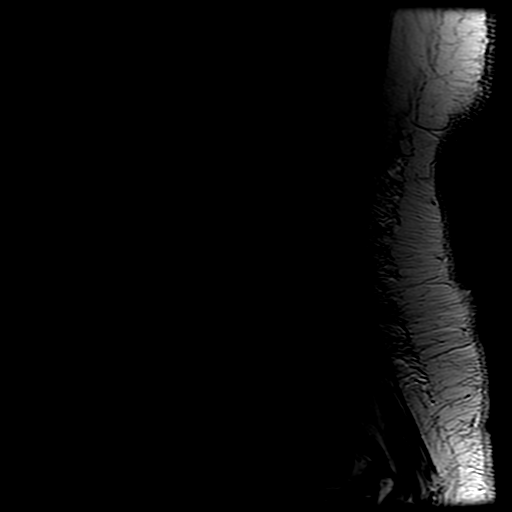
[im 9/12]
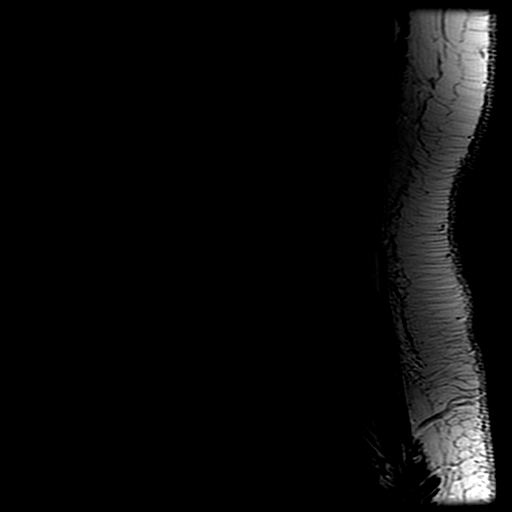
[im 12/12]
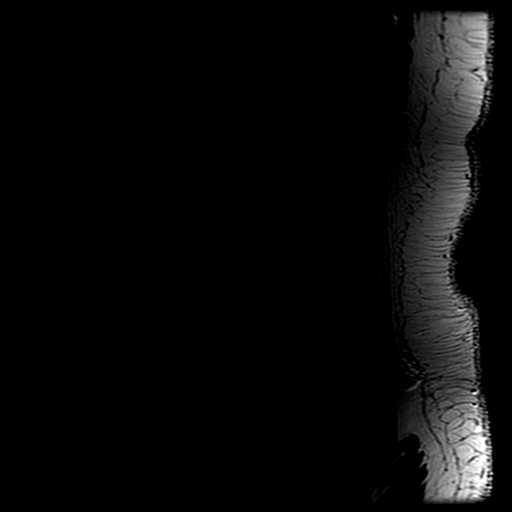

[Series 5: STIR · sagittal · 4.0mm · 0.51mm/px · 3 of 12 slices shown]
[im 1/12]
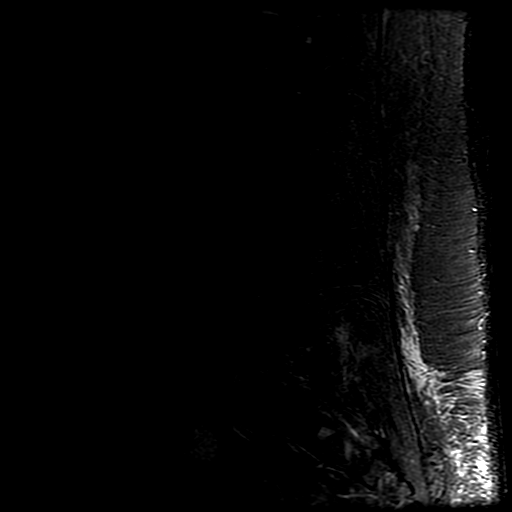
[im 6/12]
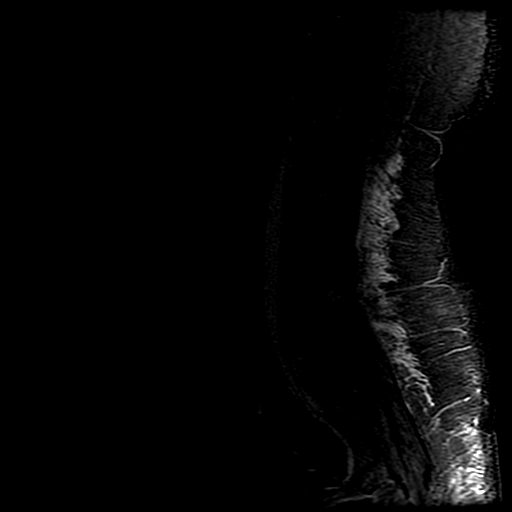
[im 12/12]
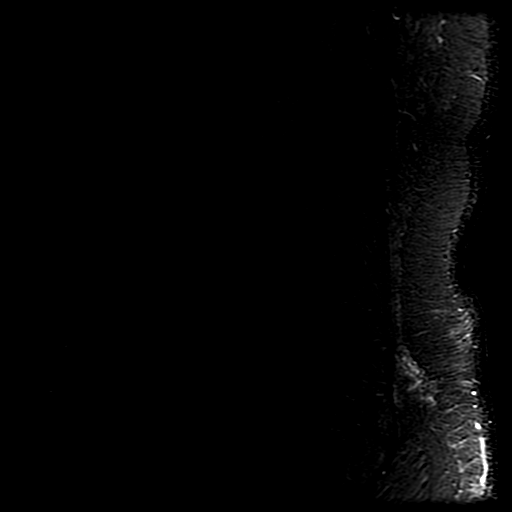

[Series 7: T2 · axial · 4.0mm · 0.39mm/px · z∈[-107,+119]mm · 7 of 16 slices shown (2 of 2)]
[im 1/16]
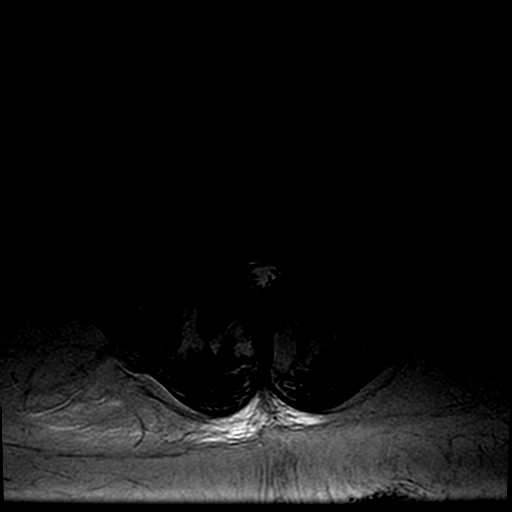
[im 3/16]
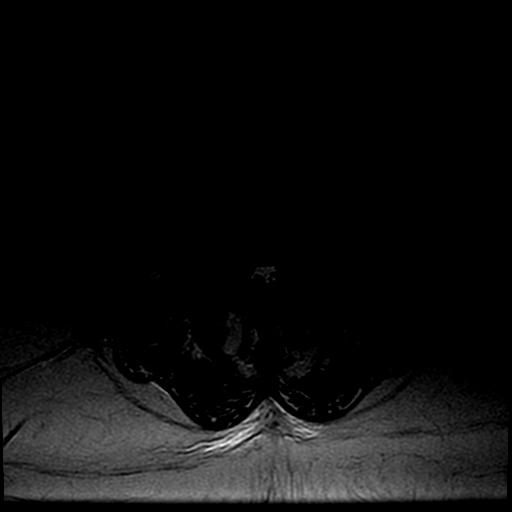
[im 6/16]
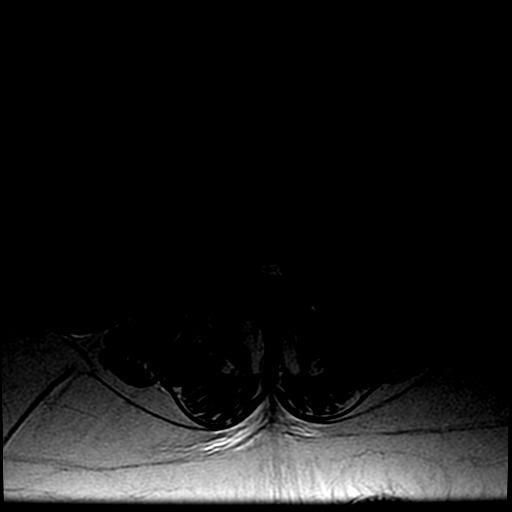
[im 8/16]
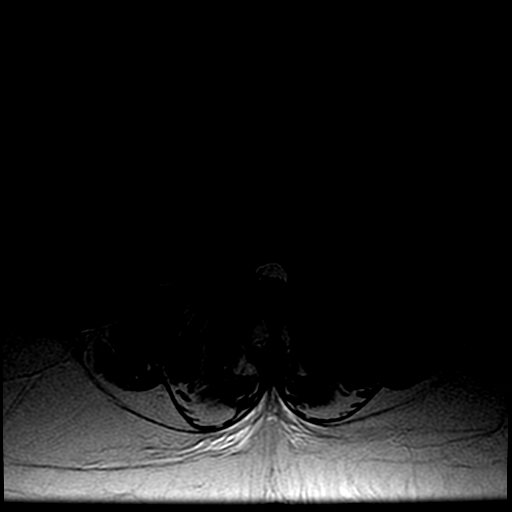
[im 11/16]
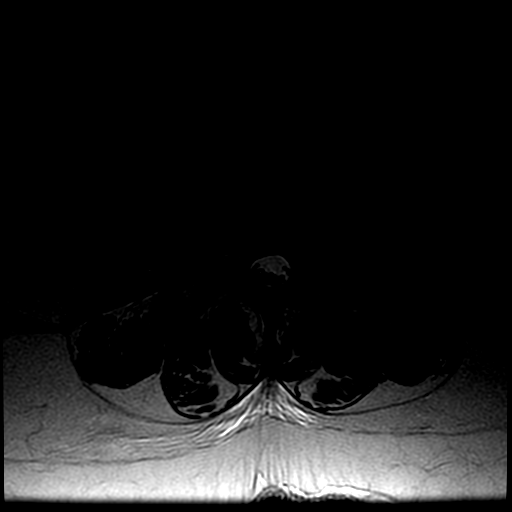
[im 13/16]
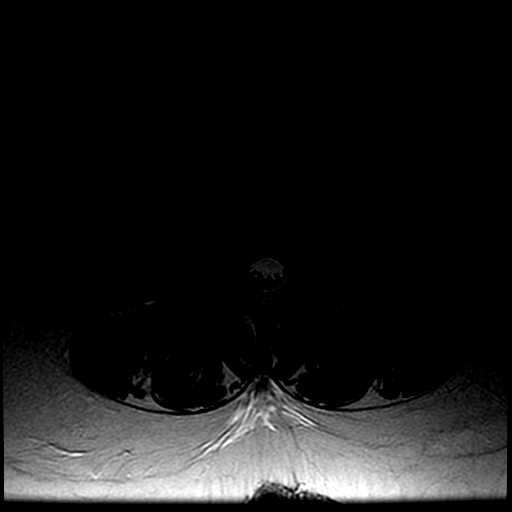
[im 16/16]
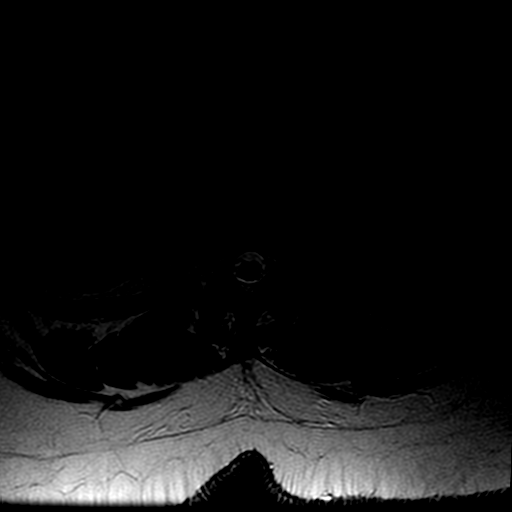

[19 of 48 positions shown; findings below may reference images not displayed]

## 2005-05-30 ENCOUNTER — Ambulatory Visit: Payer: Self-pay | Admitting: Family Medicine

## 2005-06-13 ENCOUNTER — Ambulatory Visit: Payer: Self-pay | Admitting: Family Medicine

## 2005-06-21 ENCOUNTER — Ambulatory Visit: Payer: Self-pay | Admitting: Family Medicine

## 2005-06-24 IMAGING — CT CT CHEST W/ CM
1 series · 16 of 32 positions shown, 20 images · IV contrast (75 ML OMNI 300)
Comparison: none

CLINICAL DATA: Follow up lung cancer.
 CHEST CT WITH CONTRAST ? 08/20/03 
 Multidetector spiral axial images were obtained through the thorax with IV injection of 75 cc of Omnipaque 300 and comparison made with previous [REDACTED] two view chest x-ray of 04/16/03 and [REDACTED] chest CT of 02/19/03.  Since that study, the well marginated ovoid anterior superior mediastinal mass again measures 2.5 cm long by 1.8 cm AP by 2.2 cm wide (image 18).  No progressive mediastinal, hilar, nor axillary mass/adenopathy is seen.  Lungs remain clear.  Heart size is stable.  Slight diffuse fatty infiltration of the liver is again noted with the upper abdominal organs otherwise unremarkable.  No osseous abnormality is seen.  The central Hounsfield units of the thymic focus measures 27 plus or minus 23 Hounsfield units.
 IMPRESSION 
 Since 02/19/03 chest CT, no interval change: 
 1.  Stable anterior superior mediastinal soft tissue favoring benign etiology such as thymic cyst/thymoma.
 2.  Otherwise negative.

[Series 2: — · axial · 0.70mm/px · z∈[-297,-2]mm · 16 of 65 slices shown, 20 images]
[im 3/65  mediastinal]
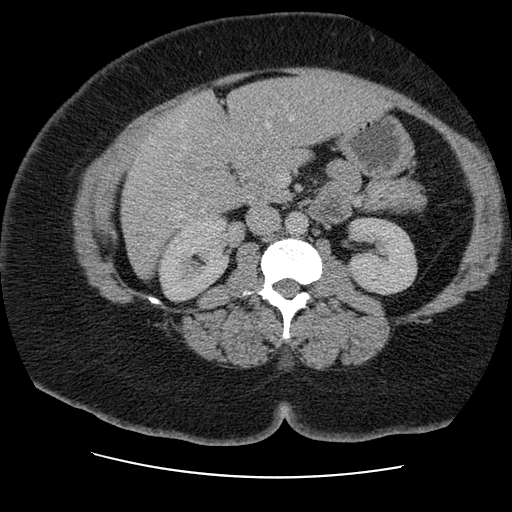
[im 3/65  lung]
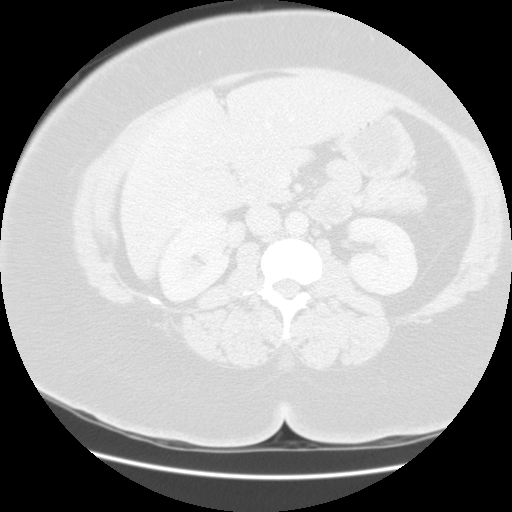
[im 8/65  lung]
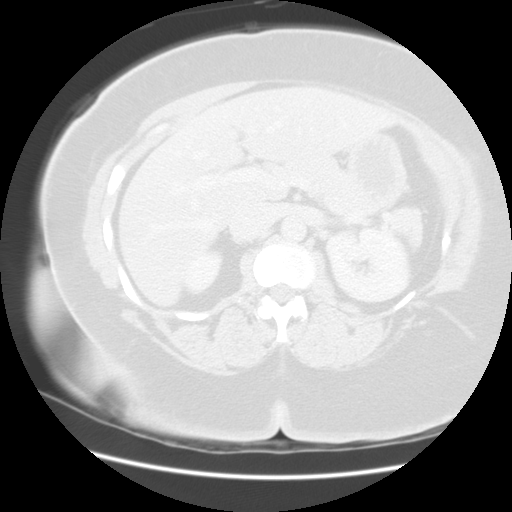
[im 12/65  lung]
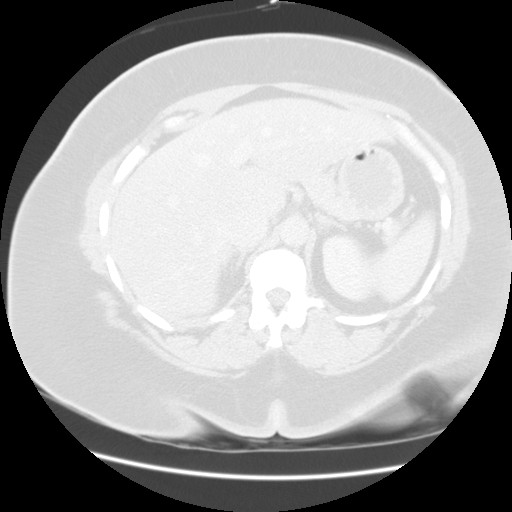
[im 15/65  lung]
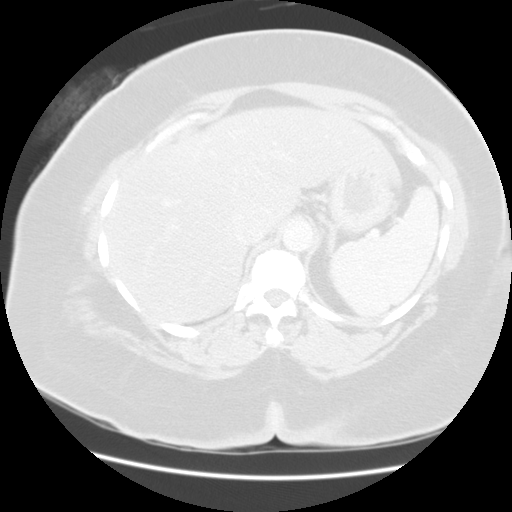
[im 19/65  mediastinal]
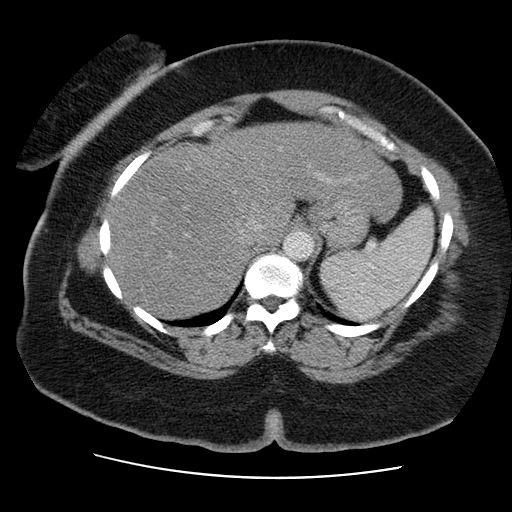
[im 19/65  lung]
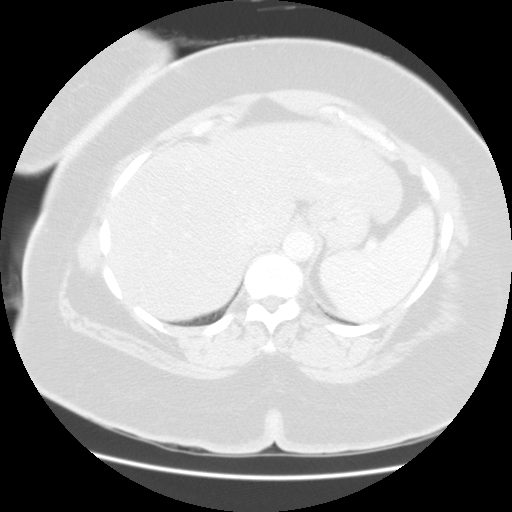
[im 24/65  lung]
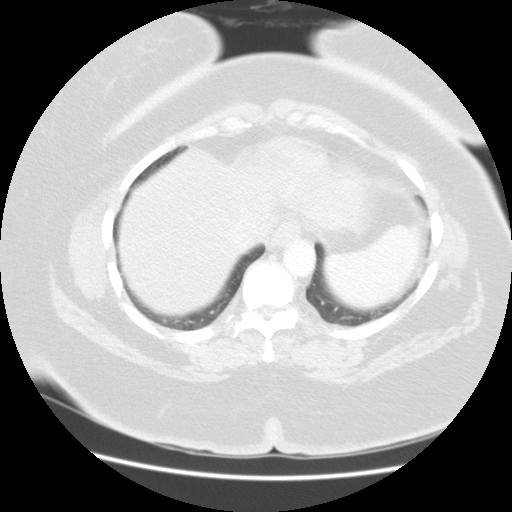
[im 27/65  lung]
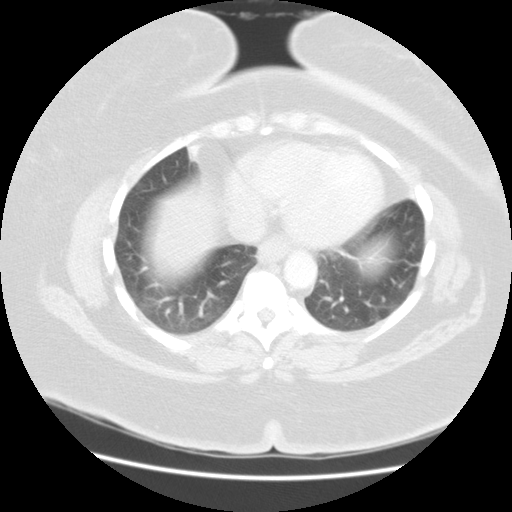
[im 31/65  lung]
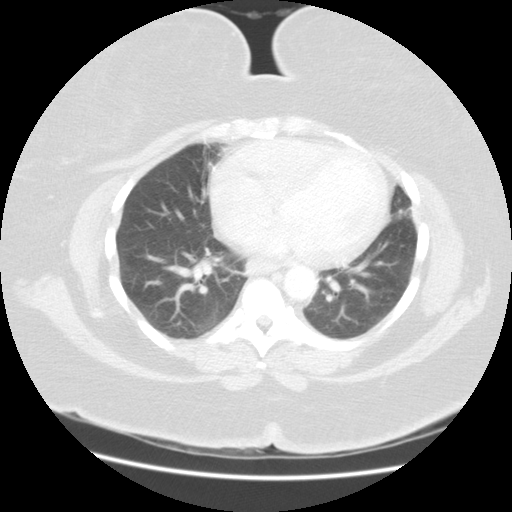
[im 34/65  mediastinal]
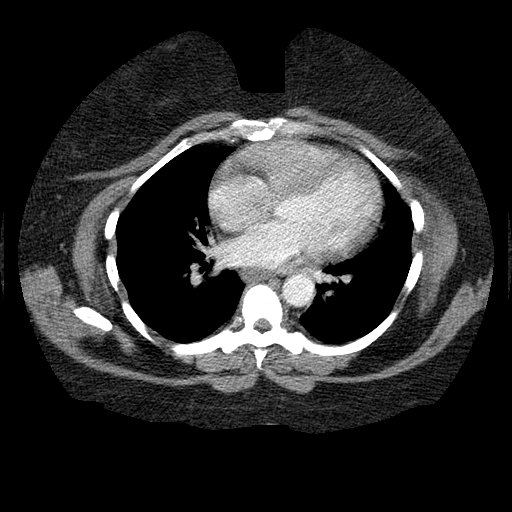
[im 34/65  lung]
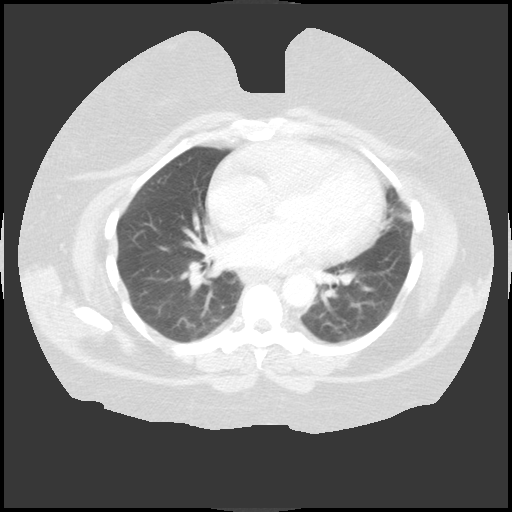
[im 38/65  lung]
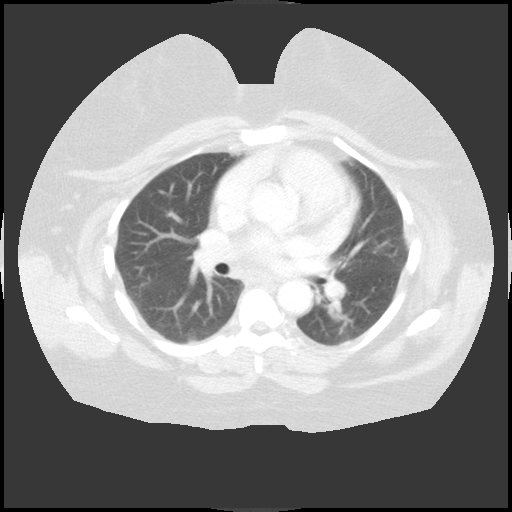
[im 41/65  lung]
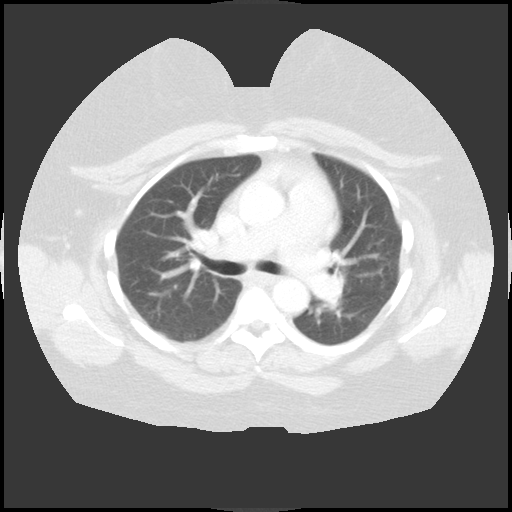
[im 46/65  lung]
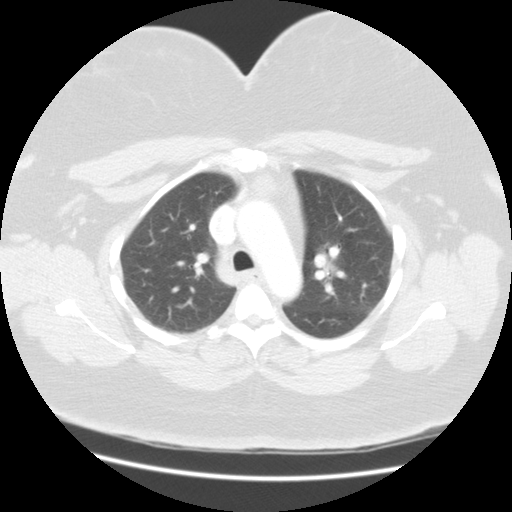
[im 50/65  mediastinal]
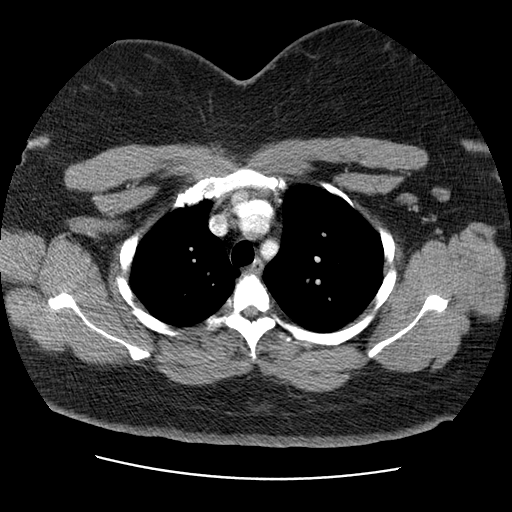
[im 50/65  lung]
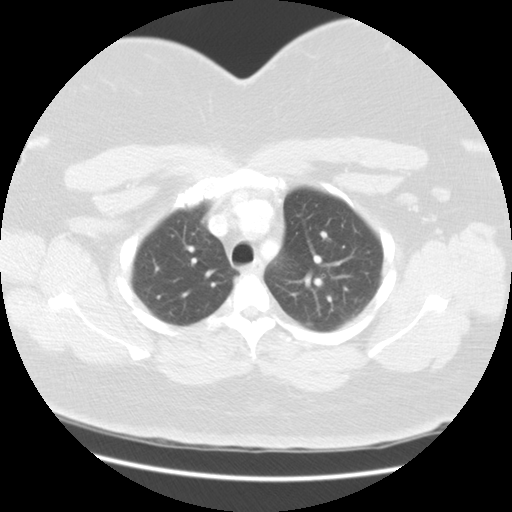
[im 53/65  lung]
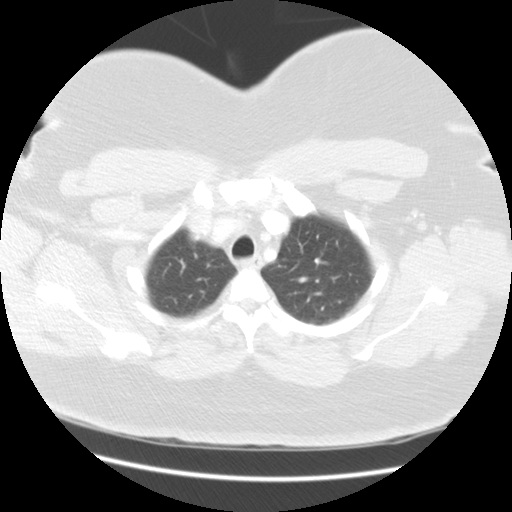
[im 57/65  lung]
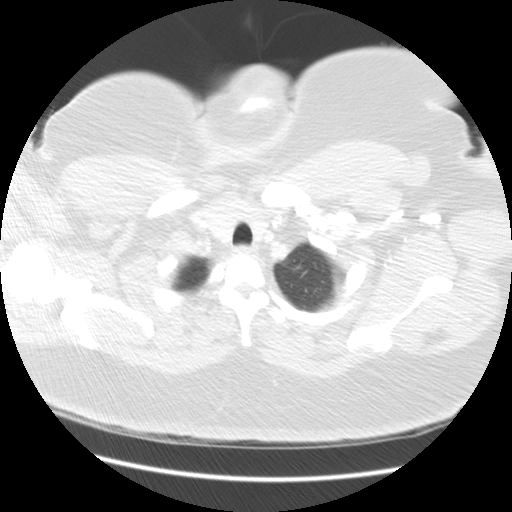
[im 62/65  lung]
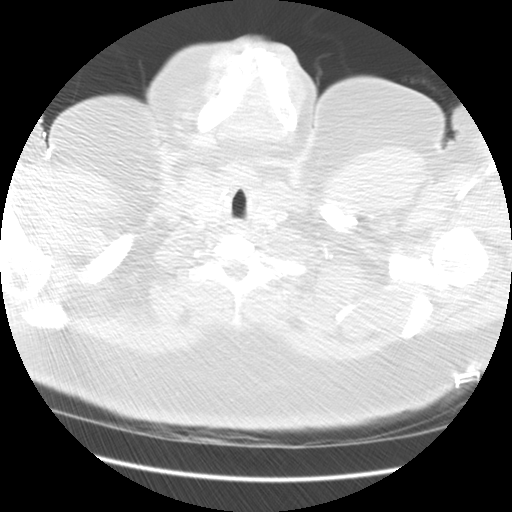

[16 of 32 positions shown; findings below may reference images not displayed]

## 2005-06-27 ENCOUNTER — Emergency Department (HOSPITAL_COMMUNITY): Admission: EM | Admit: 2005-06-27 | Discharge: 2005-06-27 | Payer: Self-pay | Admitting: Emergency Medicine

## 2005-06-28 ENCOUNTER — Emergency Department (HOSPITAL_COMMUNITY): Admission: EM | Admit: 2005-06-28 | Discharge: 2005-06-29 | Payer: Self-pay | Admitting: Emergency Medicine

## 2005-07-01 ENCOUNTER — Emergency Department (HOSPITAL_COMMUNITY): Admission: EM | Admit: 2005-07-01 | Discharge: 2005-07-01 | Payer: Self-pay | Admitting: Emergency Medicine

## 2005-07-09 ENCOUNTER — Encounter: Admission: RE | Admit: 2005-07-09 | Discharge: 2005-07-09 | Payer: Self-pay | Admitting: Orthopedic Surgery

## 2005-09-15 ENCOUNTER — Ambulatory Visit: Payer: Self-pay | Admitting: Family Medicine

## 2005-09-19 ENCOUNTER — Ambulatory Visit (HOSPITAL_COMMUNITY): Admission: RE | Admit: 2005-09-19 | Discharge: 2005-09-19 | Payer: Self-pay | Admitting: Family Medicine

## 2005-10-30 ENCOUNTER — Ambulatory Visit: Payer: Self-pay | Admitting: Family Medicine

## 2005-11-01 ENCOUNTER — Ambulatory Visit: Payer: Self-pay | Admitting: Family Medicine

## 2006-01-02 ENCOUNTER — Ambulatory Visit: Payer: Self-pay | Admitting: Family Medicine

## 2006-01-03 ENCOUNTER — Ambulatory Visit: Payer: Self-pay | Admitting: Family Medicine

## 2006-01-04 ENCOUNTER — Ambulatory Visit (HOSPITAL_COMMUNITY): Admission: RE | Admit: 2006-01-04 | Discharge: 2006-01-04 | Payer: Self-pay | Admitting: Family Medicine

## 2006-01-06 IMAGING — CT CT CHEST W/ CM
1 of 2 series · 13 of 29 positions shown, 17 images · IV contrast (omnipaque)
Comparison: none

CLINICAL DATA: Follow up lung carcinoma.
CT CHEST WITH CONTRAST:
Multidetector helical scans through the chest were performed after contrast media was given.  75 cc of Omnipaque 300 were given as the contrast media.  This exam is compared to the prior CT of the chest of 08/20/03.
There has been no change in the soft tissue mass within the anterior mediastinum, as previously described.  This mass measures 17 x 22 mm on image #20 compared to 18 x 22 mm at the same level on the prior scan.  No mediastinal or hilar adenopathy is seen.  The pulmonary arteries and thoracic aorta are grossly normal.  There is mild low attenuation throughout the liver consistent with fatty infiltration.  
On lung window images, no lung parenchymal lesion is seen.  No effusion is noted.  No bony abnormality is seen.

[Series 2: — · axial · 0.62mm/px · z∈[-251,+24]mm · 13 of 64 slices shown, 17 images]
[im 5/64  mediastinal]
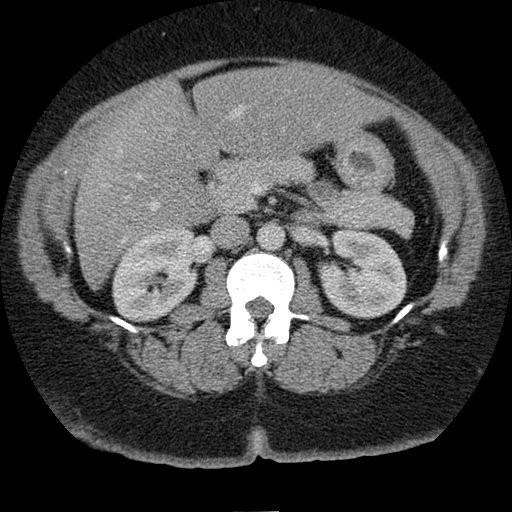
[im 5/64  lung]
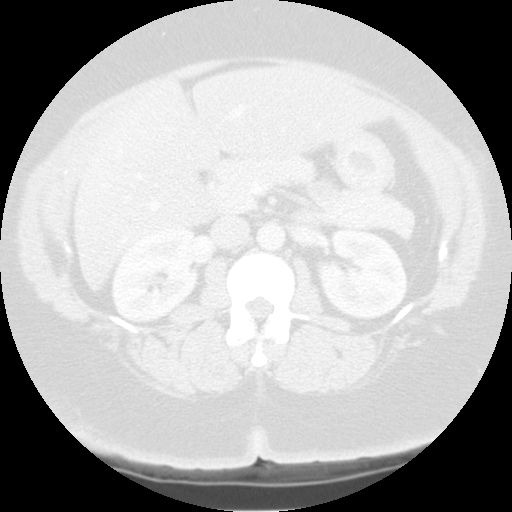
[im 10/64  lung]
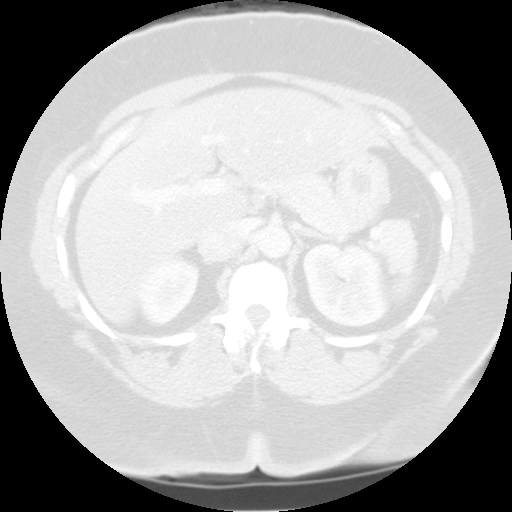
[im 14/64  lung]
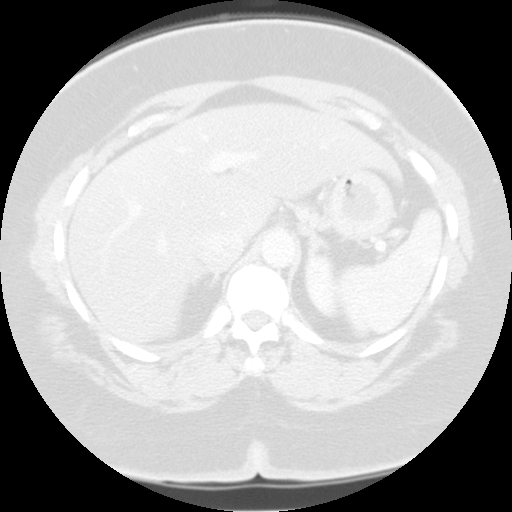
[im 19/64  lung]
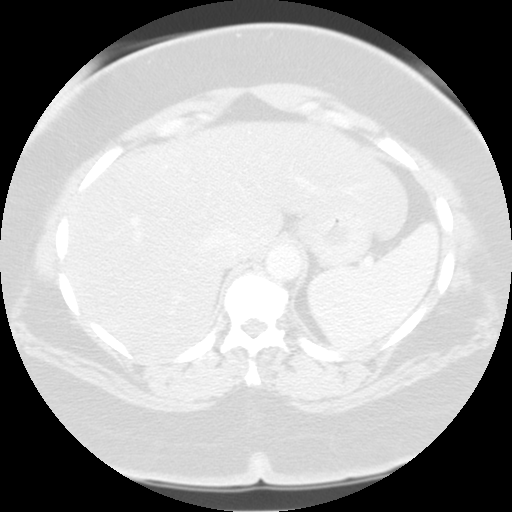
[im 23/64  mediastinal]
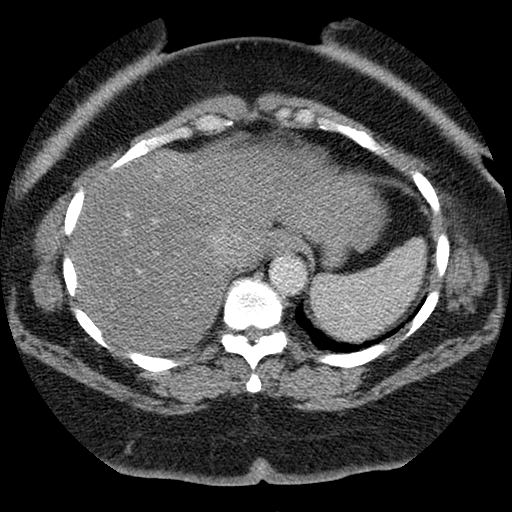
[im 23/64  lung]
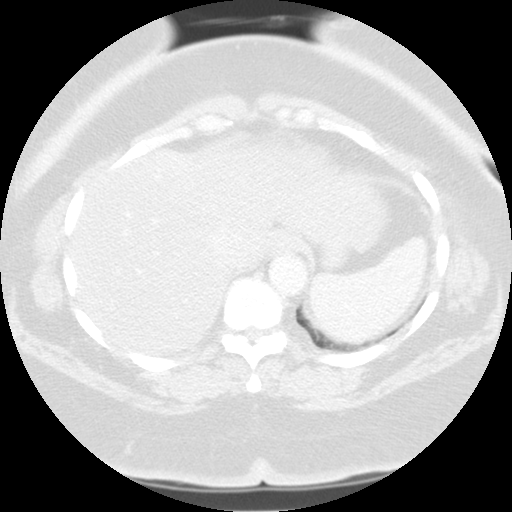
[im 28/64  lung]
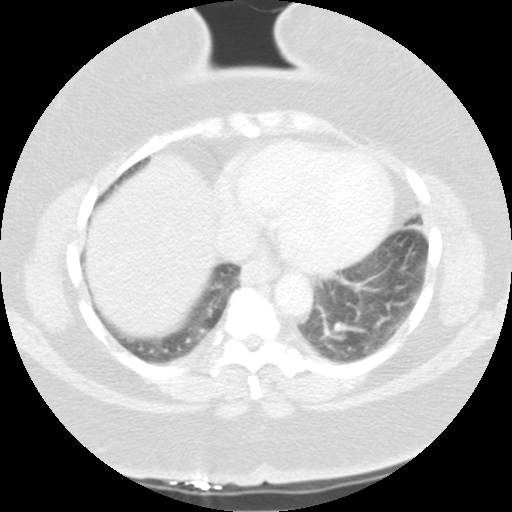
[im 32/64  lung]
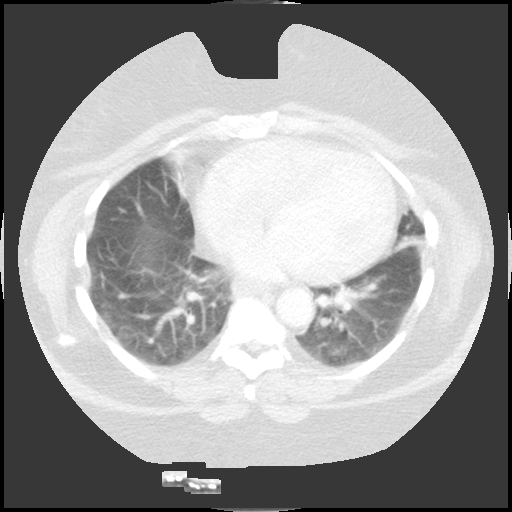
[im 37/64  lung]
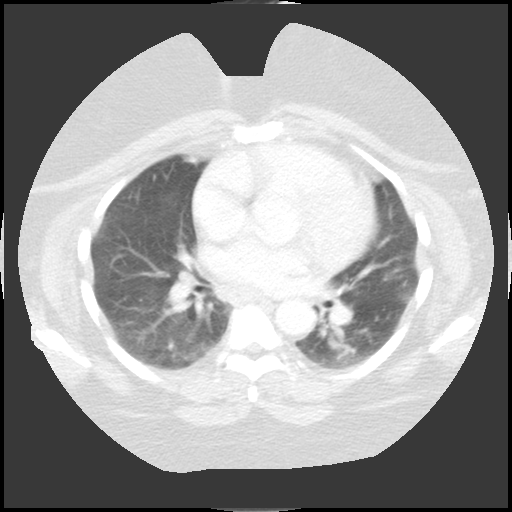
[im 41/64  mediastinal]
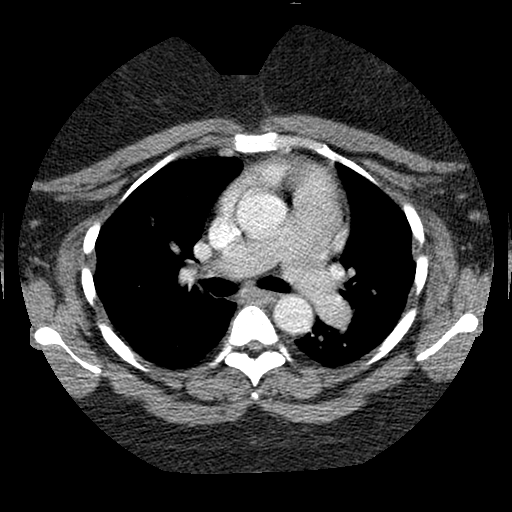
[im 41/64  lung]
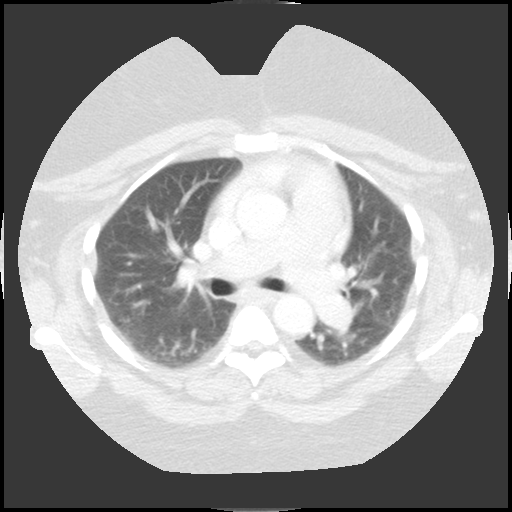
[im 46/64  lung]
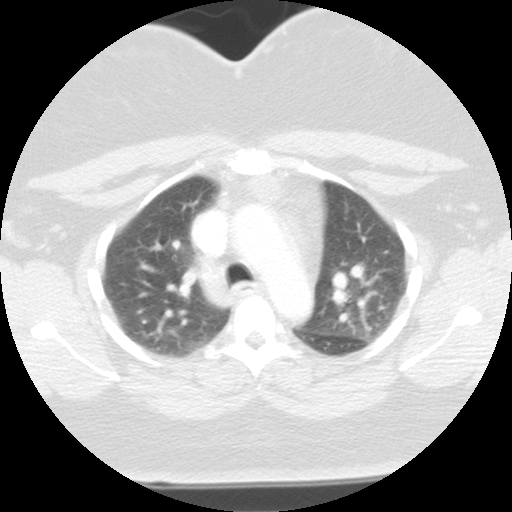
[im 50/64  lung]
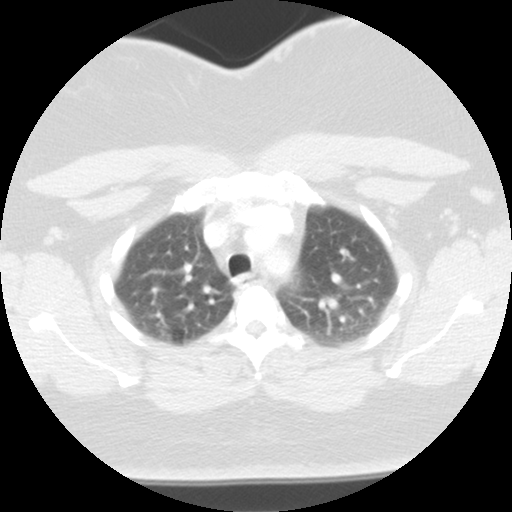
[im 55/64  lung]
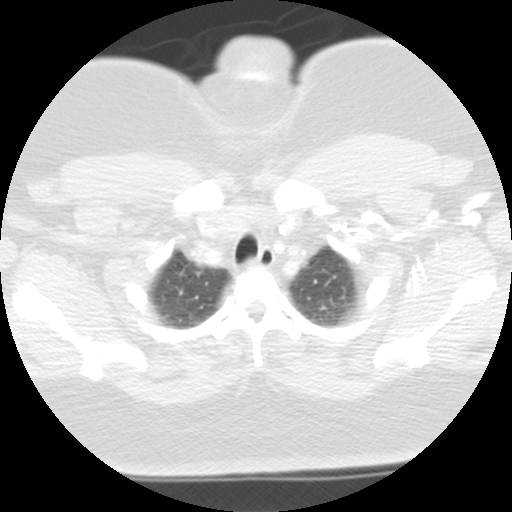
[im 59/64  mediastinal]
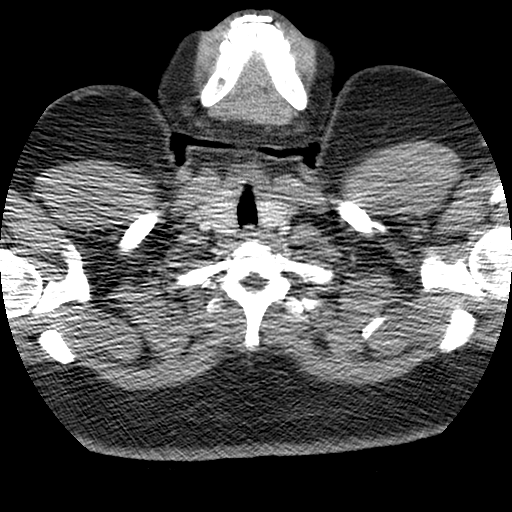
[im 59/64  lung]
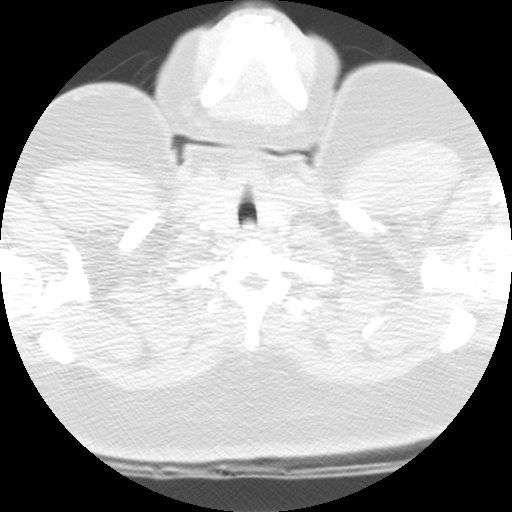

[13 of 29 positions shown; findings below may reference images not displayed]

IMPRESSION: Stable anterior mediastinal soft tissue mass when compared to CT of 08/20/03.  No mediastinal or hilar adenopathy is seen.

## 2006-03-08 ENCOUNTER — Ambulatory Visit: Payer: Self-pay | Admitting: Family Medicine

## 2006-03-09 ENCOUNTER — Ambulatory Visit: Payer: Self-pay | Admitting: *Deleted

## 2006-03-28 ENCOUNTER — Ambulatory Visit: Payer: Self-pay | Admitting: Family Medicine

## 2006-04-24 HISTORY — PX: OTHER SURGICAL HISTORY: SHX169

## 2006-05-03 ENCOUNTER — Ambulatory Visit: Payer: Self-pay | Admitting: Internal Medicine

## 2006-05-03 ENCOUNTER — Inpatient Hospital Stay (HOSPITAL_COMMUNITY): Admission: EM | Admit: 2006-05-03 | Discharge: 2006-05-07 | Payer: Self-pay | Admitting: Emergency Medicine

## 2006-05-04 ENCOUNTER — Encounter: Payer: Self-pay | Admitting: Internal Medicine

## 2006-05-04 ENCOUNTER — Encounter: Payer: Self-pay | Admitting: Vascular Surgery

## 2006-05-08 ENCOUNTER — Ambulatory Visit: Payer: Self-pay | Admitting: Family Medicine

## 2006-05-11 ENCOUNTER — Ambulatory Visit: Payer: Self-pay | Admitting: Family Medicine

## 2006-05-16 ENCOUNTER — Ambulatory Visit: Payer: Self-pay | Admitting: Family Medicine

## 2006-05-24 ENCOUNTER — Emergency Department (HOSPITAL_COMMUNITY): Admission: EM | Admit: 2006-05-24 | Discharge: 2006-05-24 | Payer: Self-pay | Admitting: Emergency Medicine

## 2006-05-25 ENCOUNTER — Emergency Department (HOSPITAL_COMMUNITY): Admission: EM | Admit: 2006-05-25 | Discharge: 2006-05-25 | Payer: Self-pay | Admitting: Emergency Medicine

## 2006-05-29 ENCOUNTER — Ambulatory Visit: Payer: Self-pay | Admitting: Family Medicine

## 2006-06-08 ENCOUNTER — Ambulatory Visit: Payer: Self-pay | Admitting: Family Medicine

## 2006-06-20 IMAGING — CR DG KNEE COMPLETE 4+V*R*
4 series · 4 of 4 positions shown · non-contrast
Comparison: None.

CLINICAL DATA: Right knee pain - no known injury. 
 RIGHT KNEE - FOUR VIEWS:

[view not recorded (1 of 4)]
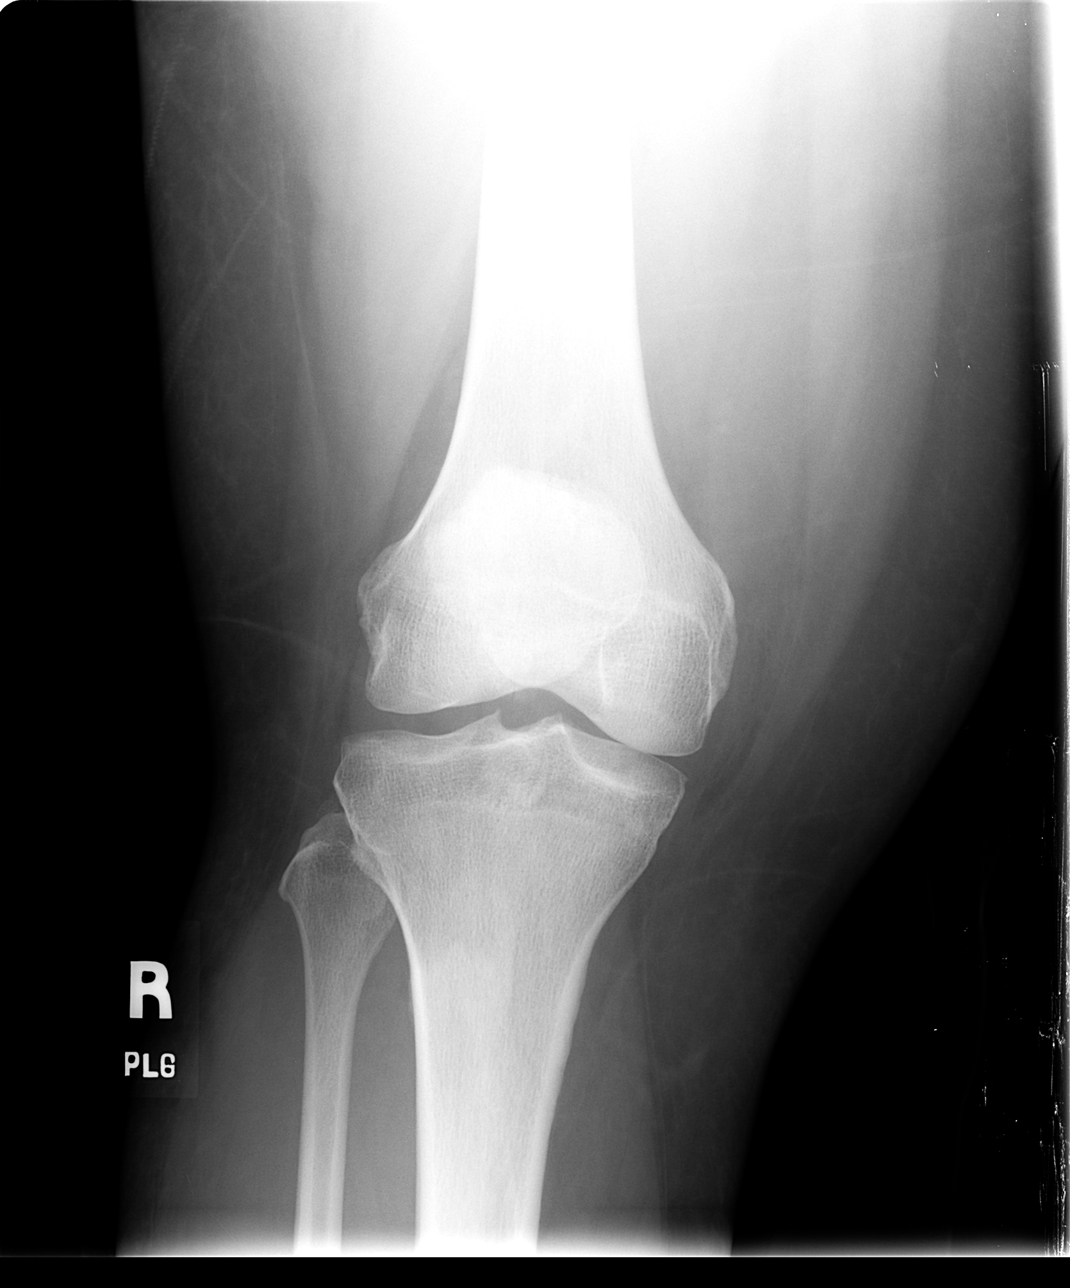

[view not recorded (2 of 4)]
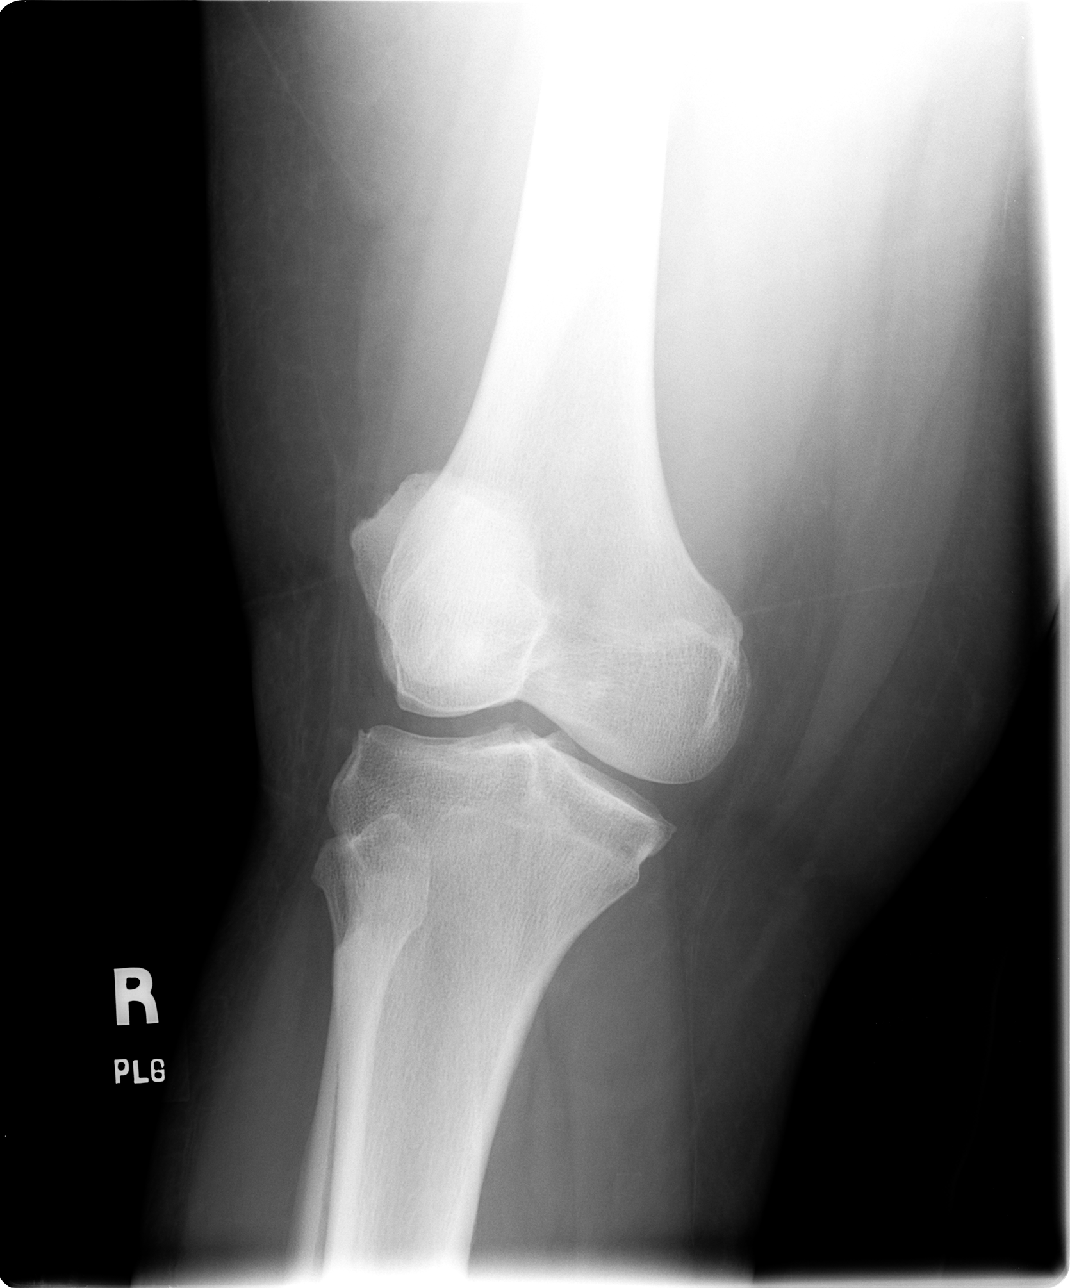

[view not recorded (3 of 4)]
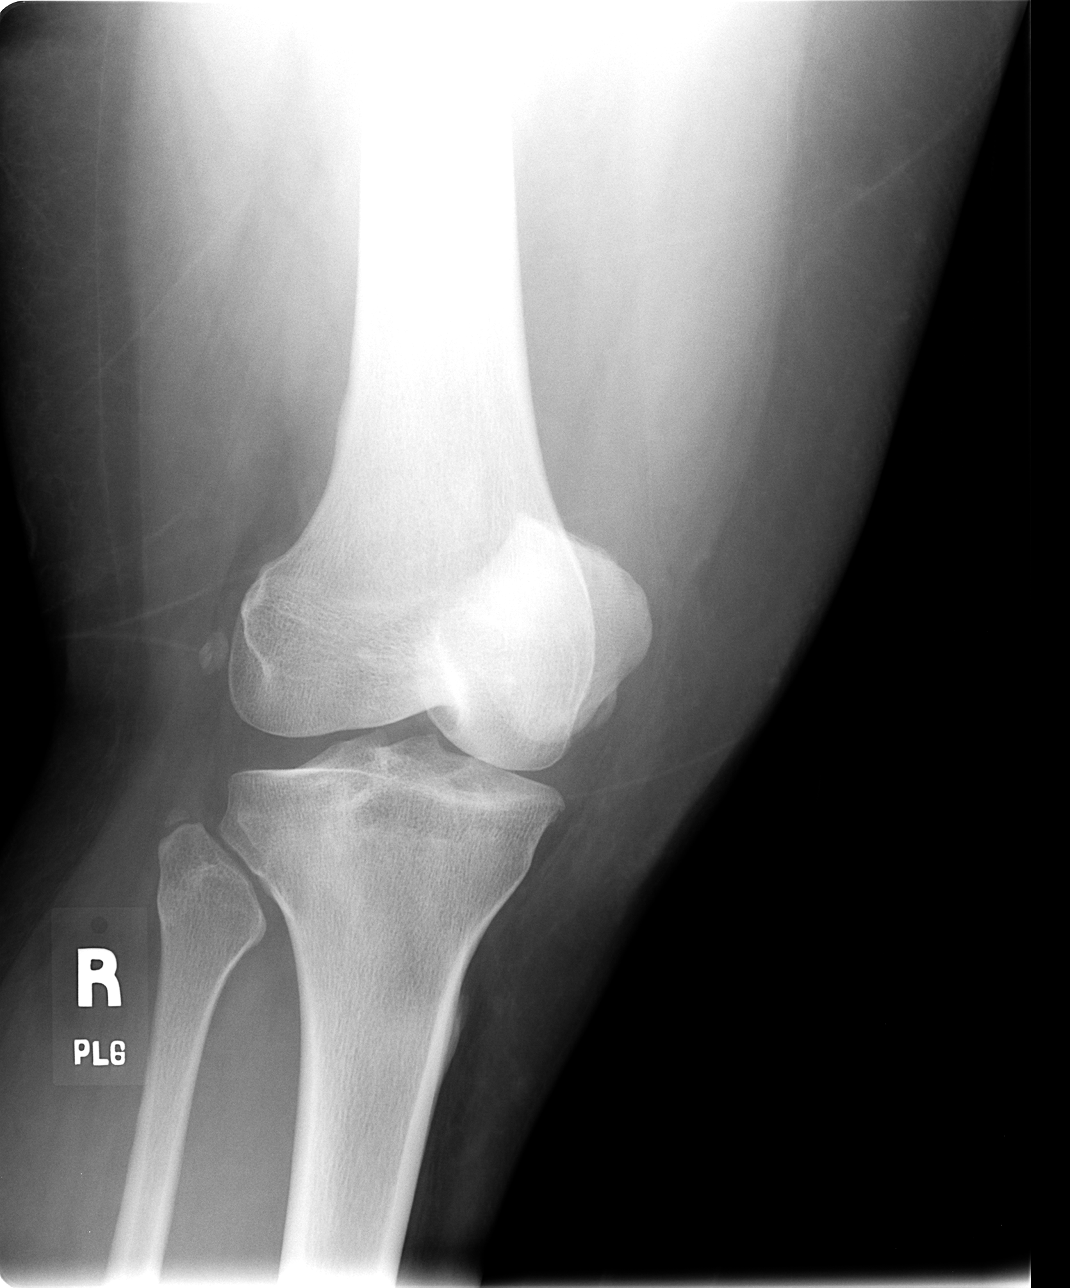

[view not recorded (4 of 4)]
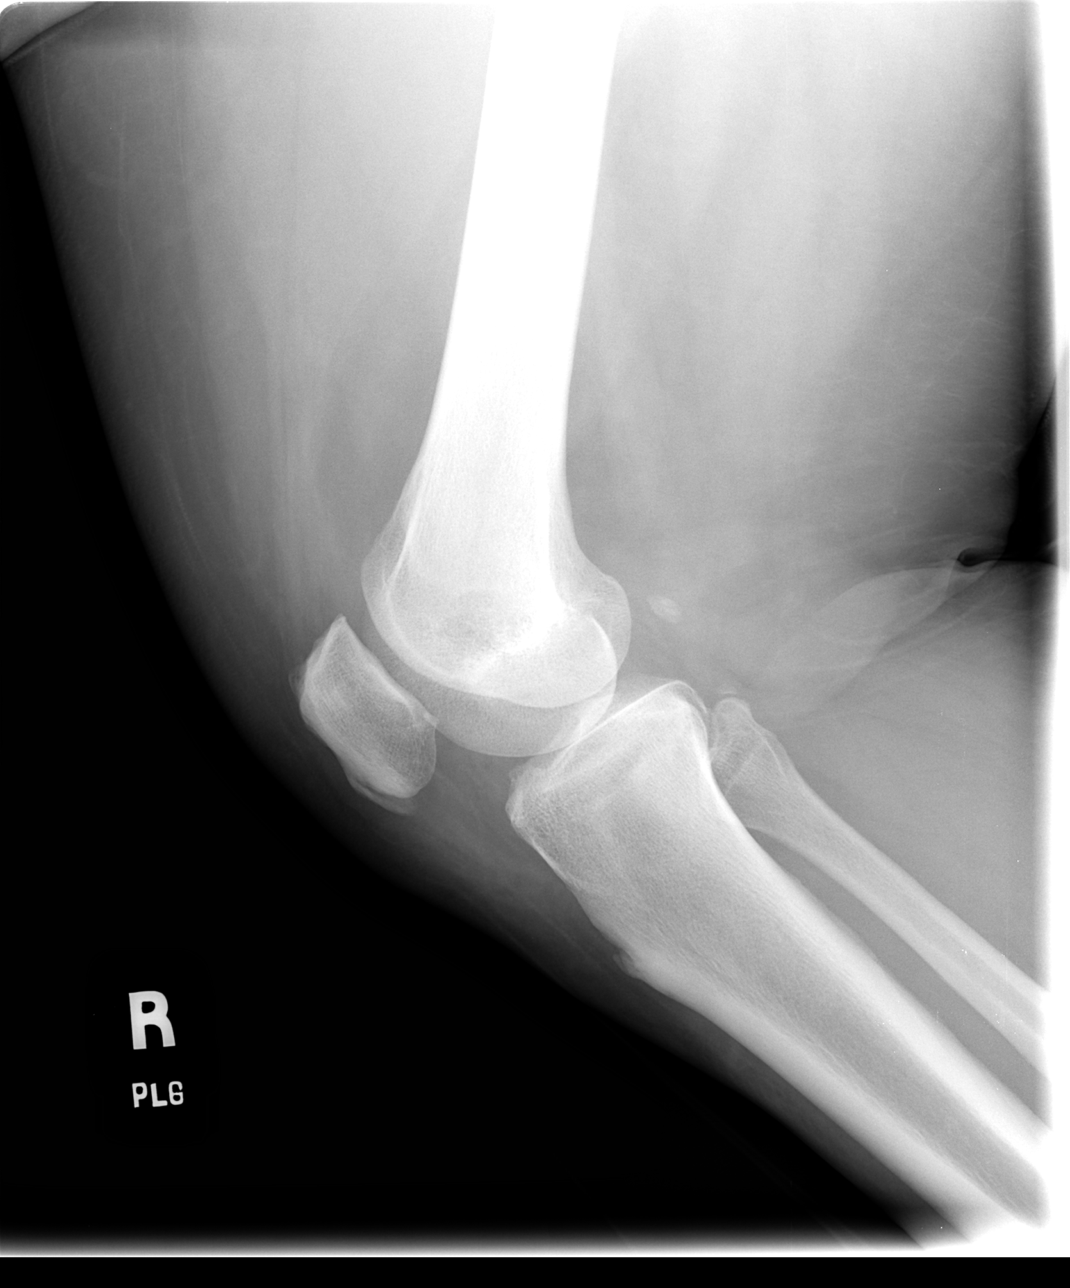

[4 of 4 positions shown; findings below may reference images not displayed]

FINDINGS: There is mild tricompartmental degenerative change.  No definite joint effusion or fracture.
IMPRESSION: Degenerative changes - negative acute.

## 2006-06-26 ENCOUNTER — Ambulatory Visit: Payer: Self-pay | Admitting: Family Medicine

## 2006-06-27 ENCOUNTER — Ambulatory Visit: Payer: Self-pay | Admitting: Thoracic Surgery

## 2006-07-06 IMAGING — CR DG CHEST 2V
2 series · 2 of 2 positions shown · non-contrast
Comparison: CT chest 03/03/04.

CLINICAL DATA: 43-year-old female ? chest pain.  Shortness of breath.   Asthma.  
 CHEST - 2 VIEWS:

[view not recorded (1 of 2)]
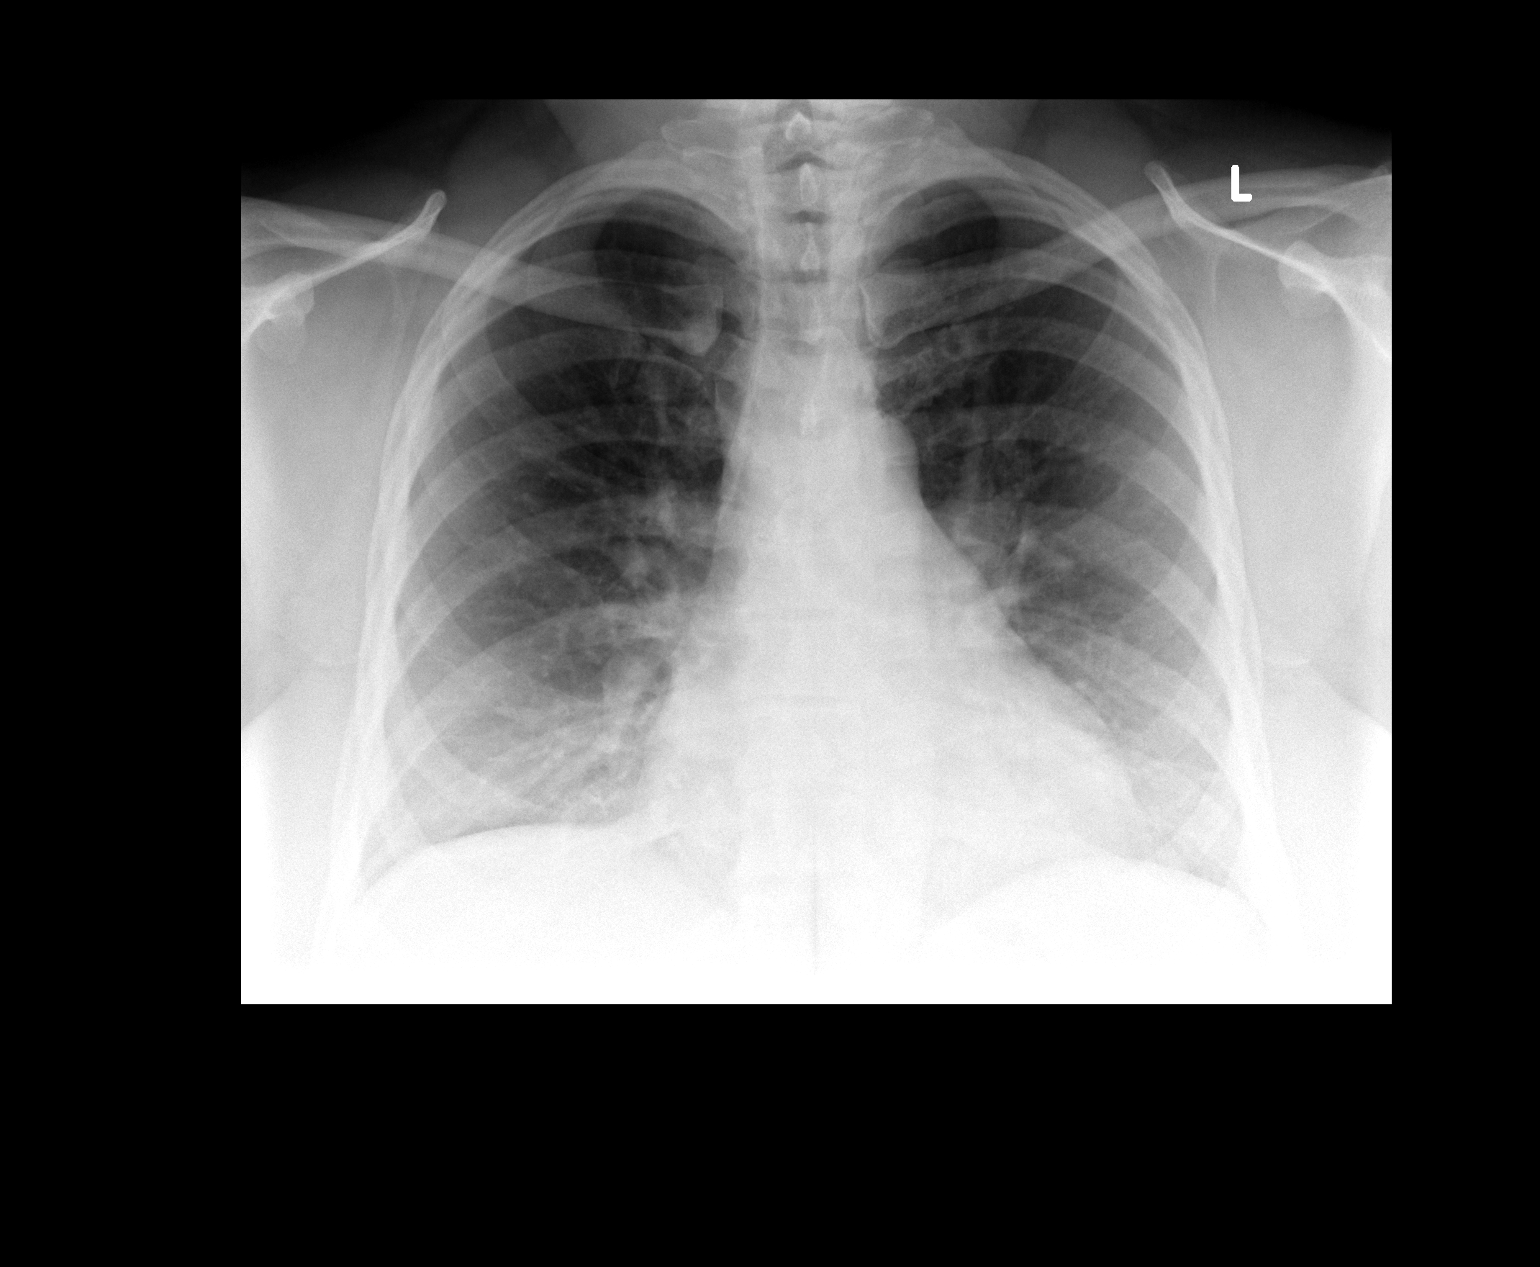

[view not recorded (2 of 2)]
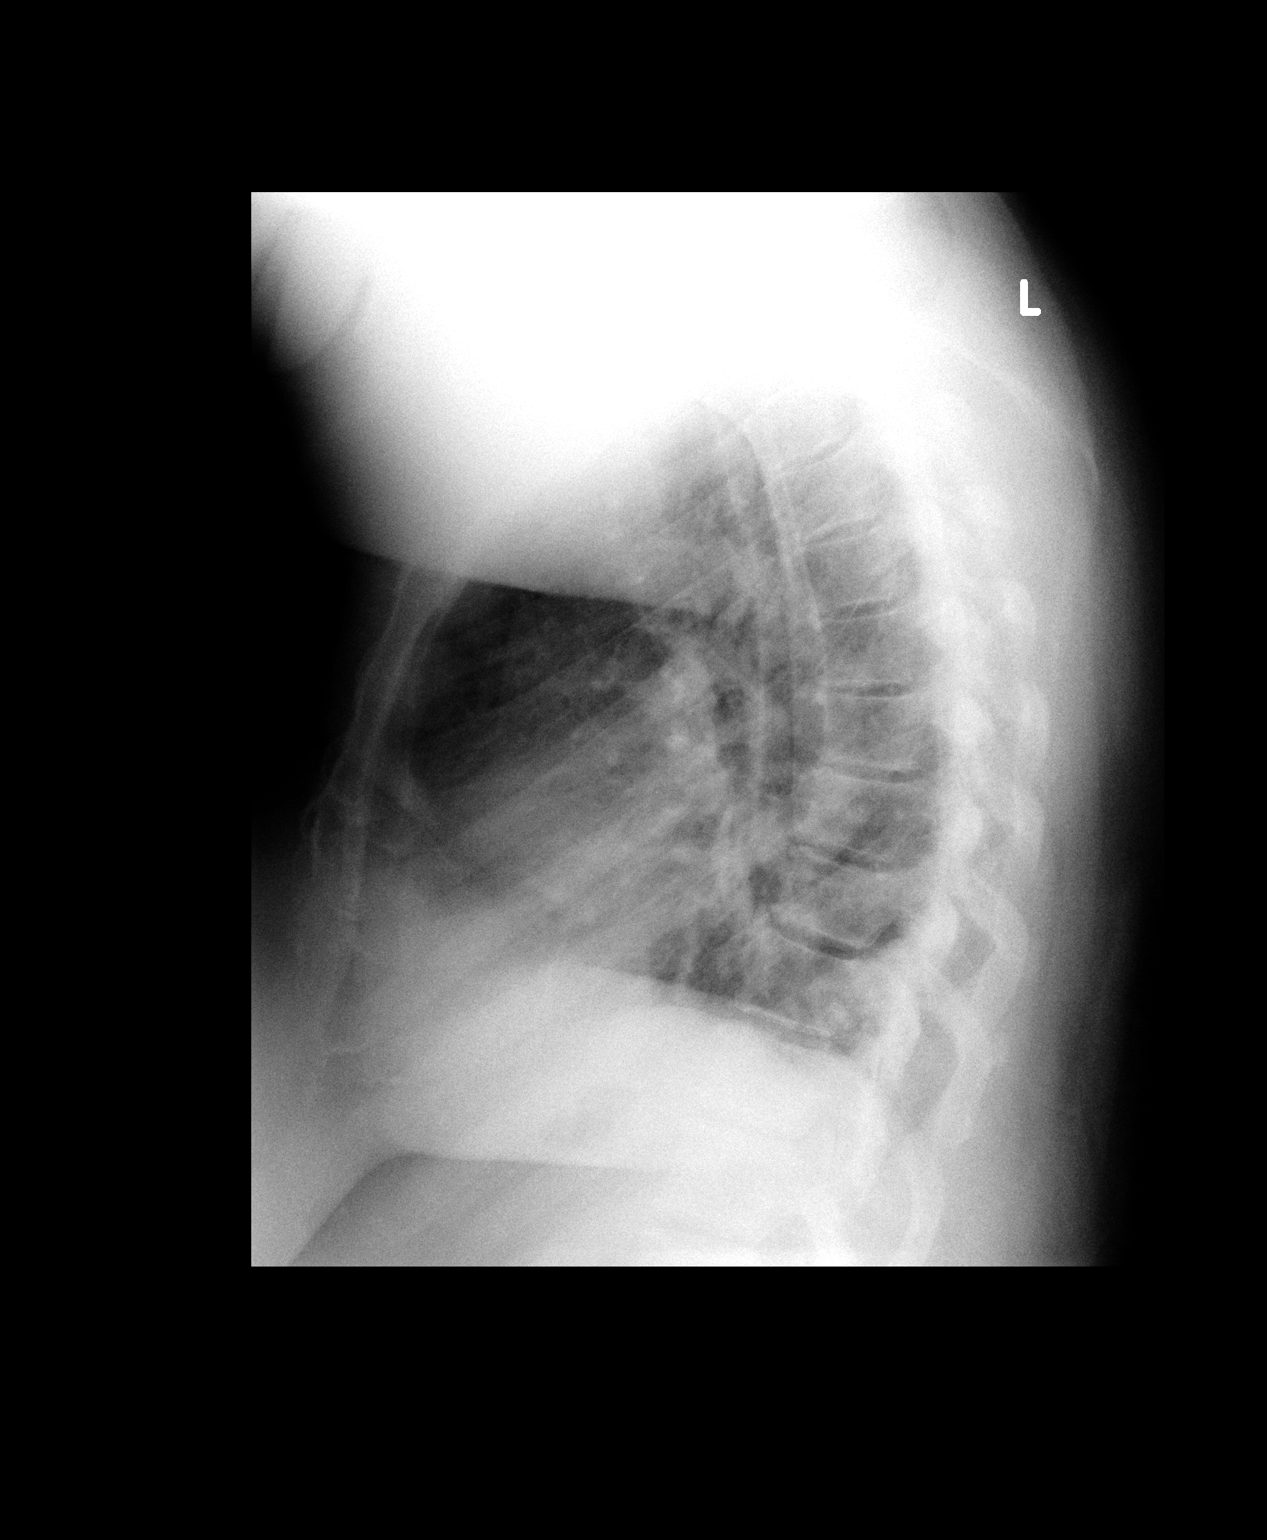

[2 of 2 positions shown; findings below may reference images not displayed]

FINDINGS: Mild cardiomegaly and vascular congestion.  Minimal basilar atelectasis.  No acute consolidation, pneumonia, effusion or pneumothorax.
IMPRESSION: Low volume exam with basilar atelectasis.

## 2006-07-13 ENCOUNTER — Emergency Department (HOSPITAL_COMMUNITY): Admission: EM | Admit: 2006-07-13 | Discharge: 2006-07-13 | Payer: Self-pay | Admitting: Emergency Medicine

## 2006-07-20 ENCOUNTER — Ambulatory Visit: Payer: Self-pay | Admitting: Thoracic Surgery

## 2006-07-20 ENCOUNTER — Ambulatory Visit: Payer: Self-pay | Admitting: Family Medicine

## 2006-08-06 ENCOUNTER — Ambulatory Visit: Payer: Self-pay | Admitting: Family Medicine

## 2006-08-20 ENCOUNTER — Emergency Department (HOSPITAL_COMMUNITY): Admission: EM | Admit: 2006-08-20 | Discharge: 2006-08-20 | Payer: Self-pay | Admitting: *Deleted

## 2006-08-23 ENCOUNTER — Ambulatory Visit: Payer: Self-pay | Admitting: Family Medicine

## 2006-09-03 ENCOUNTER — Ambulatory Visit: Payer: Self-pay | Admitting: Family Medicine

## 2006-09-12 ENCOUNTER — Ambulatory Visit: Payer: Self-pay | Admitting: Family Medicine

## 2006-09-13 DIAGNOSIS — K589 Irritable bowel syndrome without diarrhea: Secondary | ICD-10-CM | POA: Insufficient documentation

## 2006-09-13 DIAGNOSIS — Z86718 Personal history of other venous thrombosis and embolism: Secondary | ICD-10-CM | POA: Insufficient documentation

## 2006-09-13 DIAGNOSIS — G5600 Carpal tunnel syndrome, unspecified upper limb: Secondary | ICD-10-CM | POA: Insufficient documentation

## 2006-09-13 DIAGNOSIS — M5137 Other intervertebral disc degeneration, lumbosacral region: Secondary | ICD-10-CM | POA: Insufficient documentation

## 2006-09-13 DIAGNOSIS — F411 Generalized anxiety disorder: Secondary | ICD-10-CM | POA: Insufficient documentation

## 2006-09-13 DIAGNOSIS — I10 Essential (primary) hypertension: Secondary | ICD-10-CM | POA: Insufficient documentation

## 2006-09-13 DIAGNOSIS — R222 Localized swelling, mass and lump, trunk: Secondary | ICD-10-CM | POA: Insufficient documentation

## 2006-09-13 DIAGNOSIS — F329 Major depressive disorder, single episode, unspecified: Secondary | ICD-10-CM | POA: Insufficient documentation

## 2006-09-13 DIAGNOSIS — A530 Latent syphilis, unspecified as early or late: Secondary | ICD-10-CM | POA: Insufficient documentation

## 2006-09-19 ENCOUNTER — Ambulatory Visit: Payer: Self-pay | Admitting: Family Medicine

## 2006-10-14 ENCOUNTER — Inpatient Hospital Stay (HOSPITAL_COMMUNITY): Admission: EM | Admit: 2006-10-14 | Discharge: 2006-10-16 | Payer: Self-pay | Admitting: Emergency Medicine

## 2006-10-17 ENCOUNTER — Ambulatory Visit: Payer: Self-pay | Admitting: *Deleted

## 2006-10-25 ENCOUNTER — Ambulatory Visit: Payer: Self-pay | Admitting: Internal Medicine

## 2006-10-27 ENCOUNTER — Emergency Department (HOSPITAL_COMMUNITY): Admission: EM | Admit: 2006-10-27 | Discharge: 2006-10-27 | Payer: Self-pay | Admitting: Emergency Medicine

## 2006-10-30 ENCOUNTER — Ambulatory Visit: Payer: Self-pay | Admitting: Family Medicine

## 2006-11-01 ENCOUNTER — Ambulatory Visit (HOSPITAL_COMMUNITY): Admission: RE | Admit: 2006-11-01 | Discharge: 2006-11-01 | Payer: Self-pay | Admitting: Thoracic Surgery

## 2006-11-07 ENCOUNTER — Ambulatory Visit: Payer: Self-pay | Admitting: Thoracic Surgery

## 2006-11-08 ENCOUNTER — Ambulatory Visit: Payer: Self-pay | Admitting: Internal Medicine

## 2006-11-16 ENCOUNTER — Ambulatory Visit: Payer: Self-pay | Admitting: Family Medicine

## 2006-11-20 ENCOUNTER — Ambulatory Visit (HOSPITAL_COMMUNITY): Admission: RE | Admit: 2006-11-20 | Discharge: 2006-11-20 | Payer: Self-pay | Admitting: Family Medicine

## 2006-12-04 ENCOUNTER — Ambulatory Visit: Payer: Self-pay | Admitting: Family Medicine

## 2006-12-11 ENCOUNTER — Ambulatory Visit: Payer: Self-pay | Admitting: Internal Medicine

## 2006-12-18 IMAGING — CR DG CHEST 2V
2 series · 2 of 2 positions shown · non-contrast
Comparison: 08/31/2004

CLINICAL DATA: Chest pain, shortness of breath

CHEST - 2 VIEW:

[w chest pa]
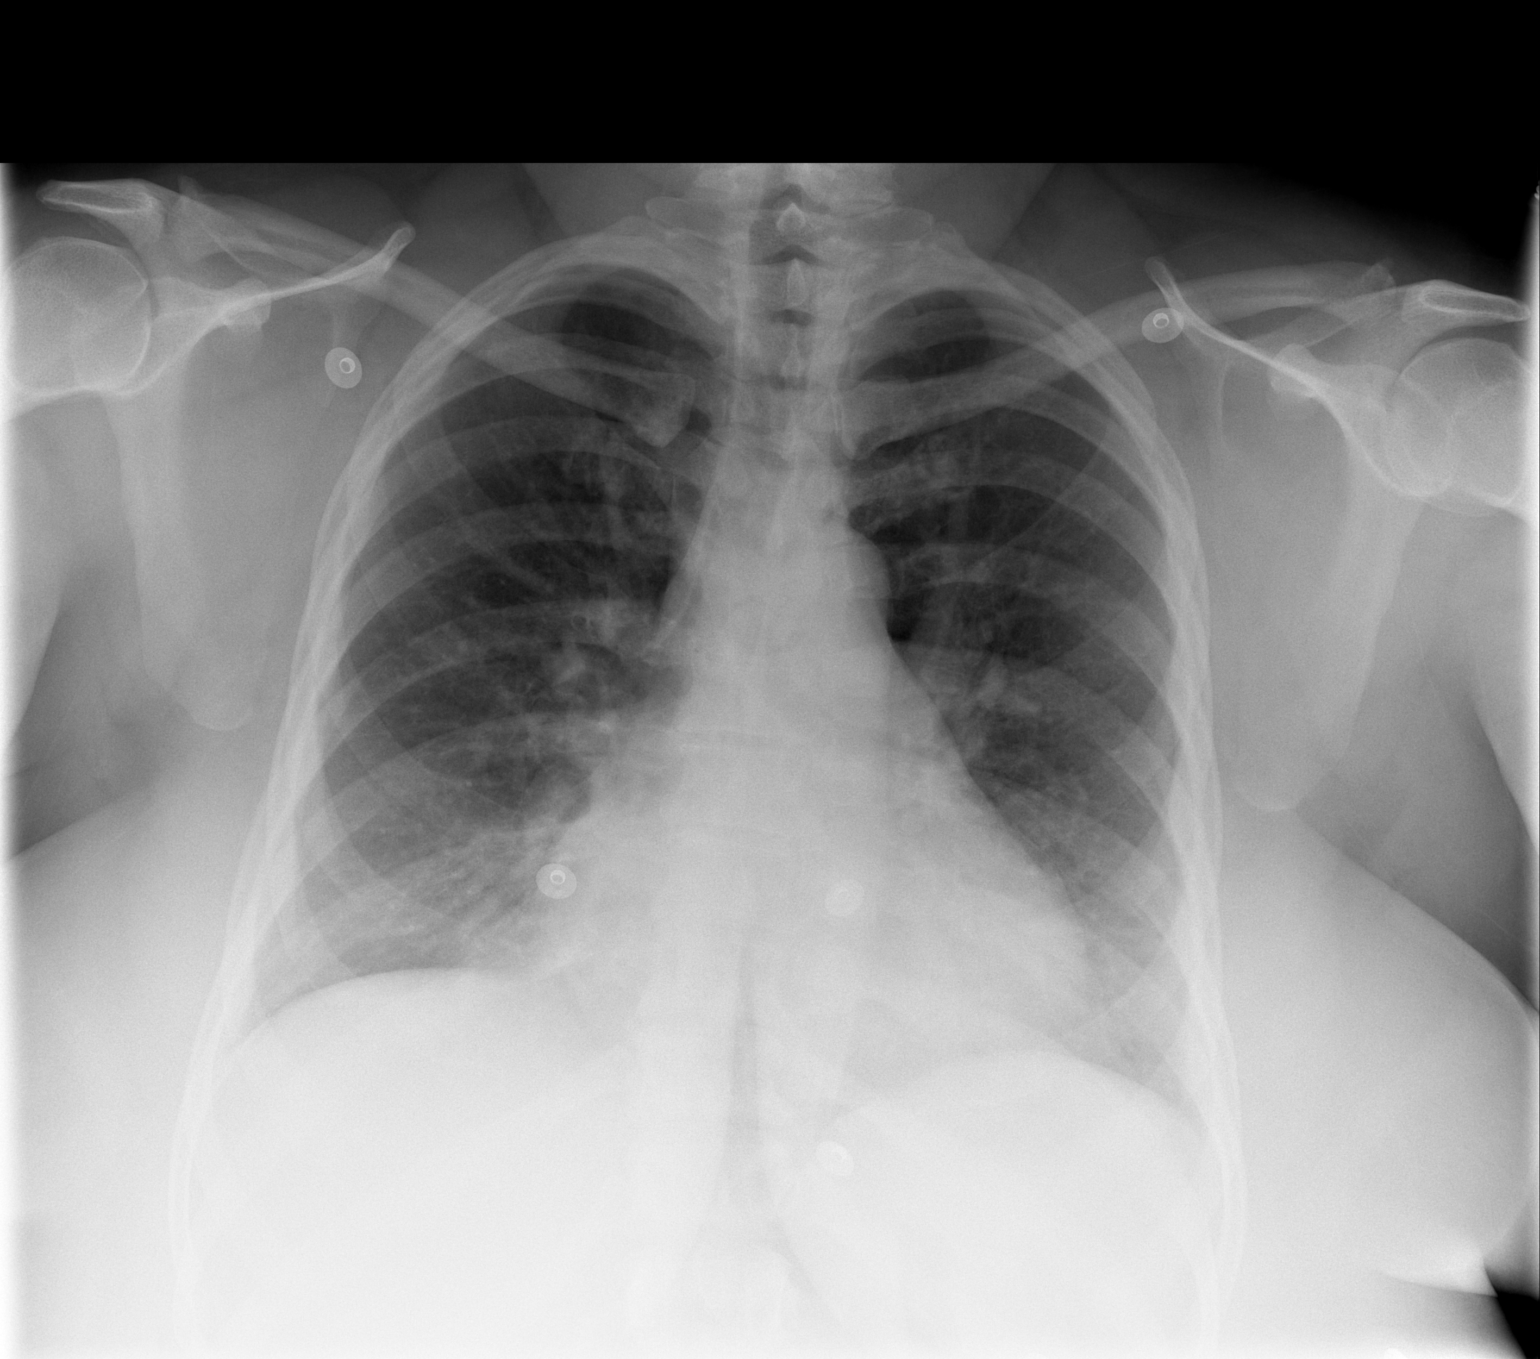

[w chest lat]
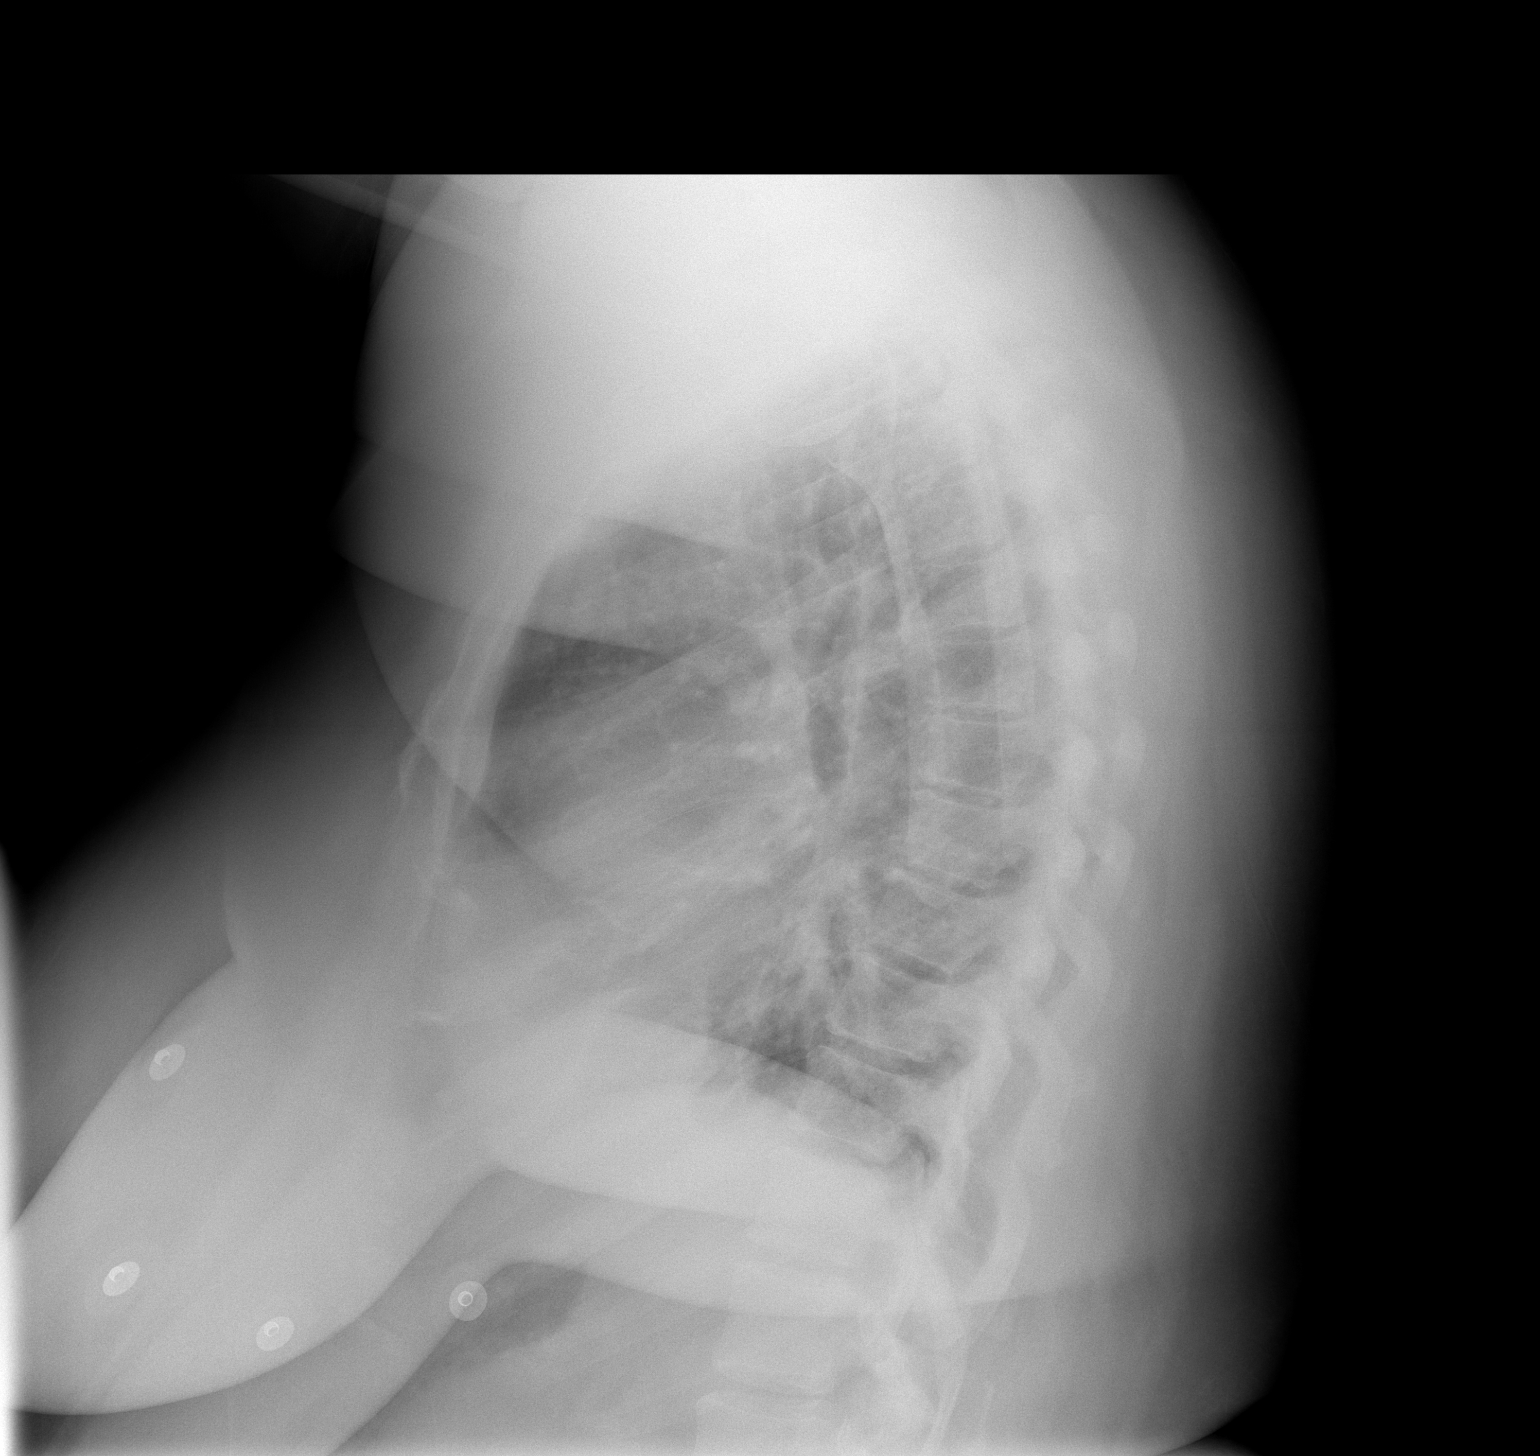

[2 of 2 positions shown; findings below may reference images not displayed]

FINDINGS: Mild peribronchial thickening. Right base atelectasis. Heart is
borderline in size. No effusions. No change since prior study.
IMPRESSION: Mild bronchitic changes. Borderline heart size. No change.

## 2006-12-28 IMAGING — CT CT CHEST W/ CM
1 series · 16 of 32 positions shown, 20 images · IV contrast (OMNIPAQUE 75CC)
Comparison: 02/12/05

CLINICAL DATA: Anterosuperior mediastinal mass.                                                                                                                       CT CHEST WITH CONTRAST:
TECHNIQUE: Multidetector CT imaging of the chest was performed following the standard protocol during bolus administration of intravenous contrast.
Contrast:  75 cc Omnipaque 300

[Series 2: — · axial · 0.70mm/px · z∈[-287,+3]mm · 16 of 63 slices shown, 20 images]
[im 3/63  mediastinal]
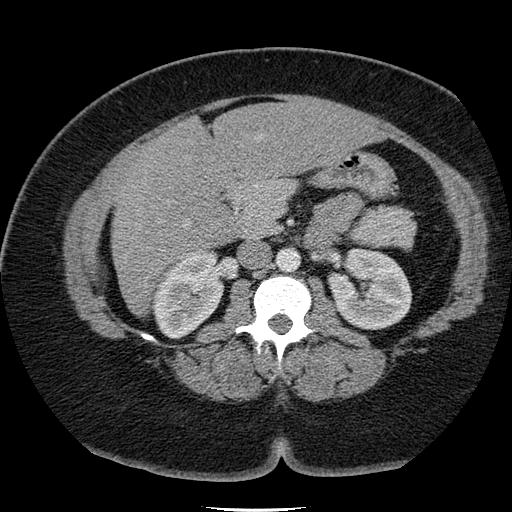
[im 3/63  lung]
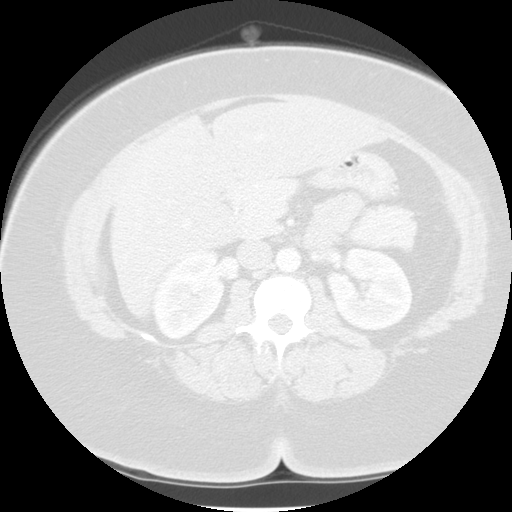
[im 7/63  lung]
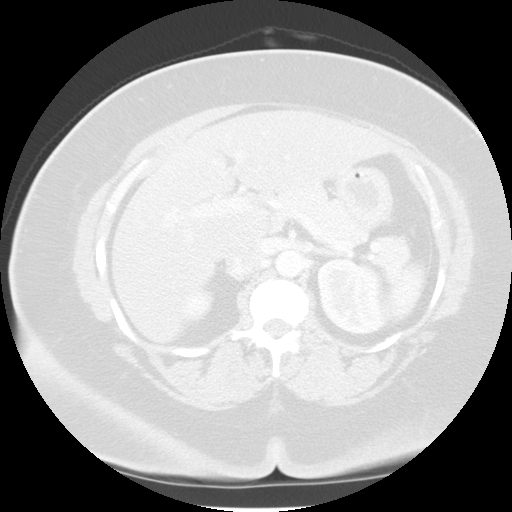
[im 12/63  lung]
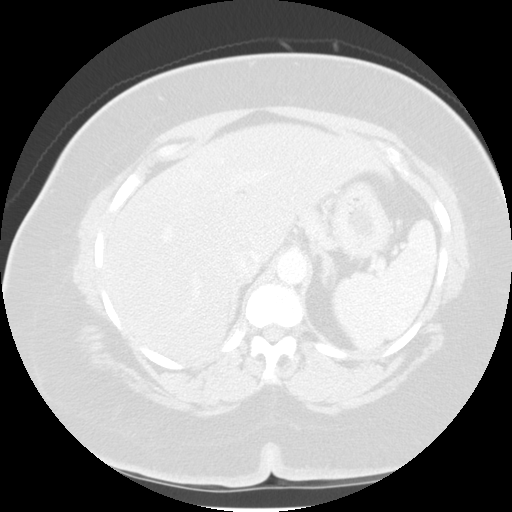
[im 14/63  lung]
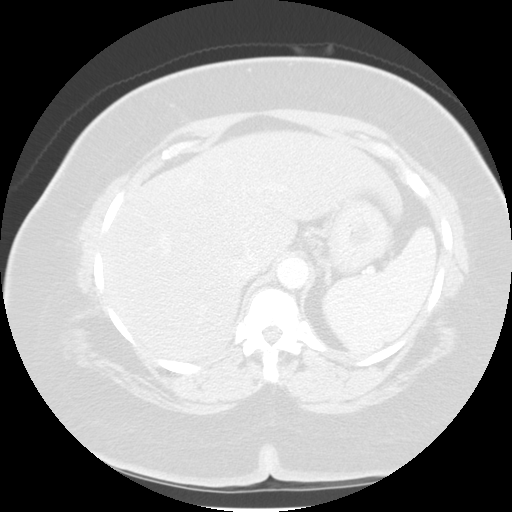
[im 19/63  mediastinal]
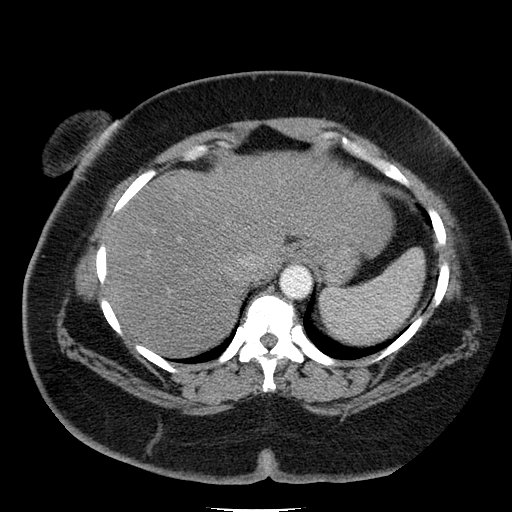
[im 19/63  lung]
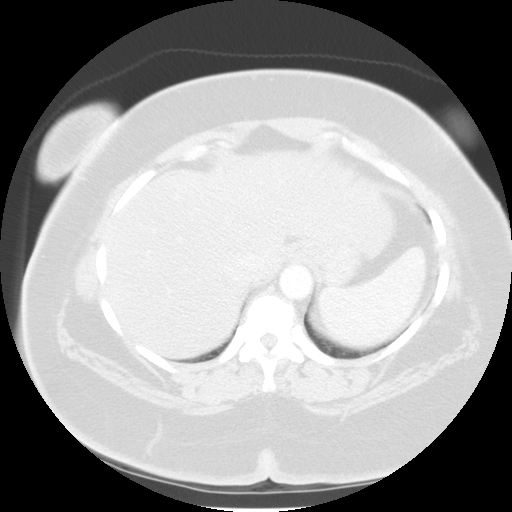
[im 23/63  lung]
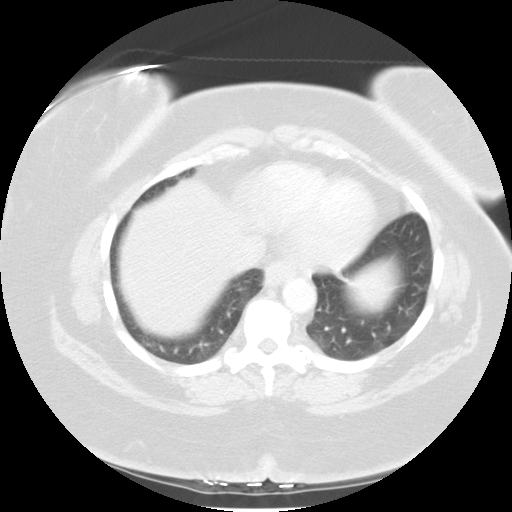
[im 26/63  lung]
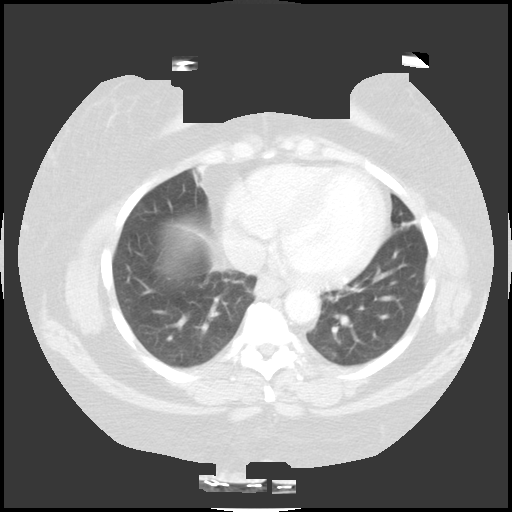
[im 30/63  lung]
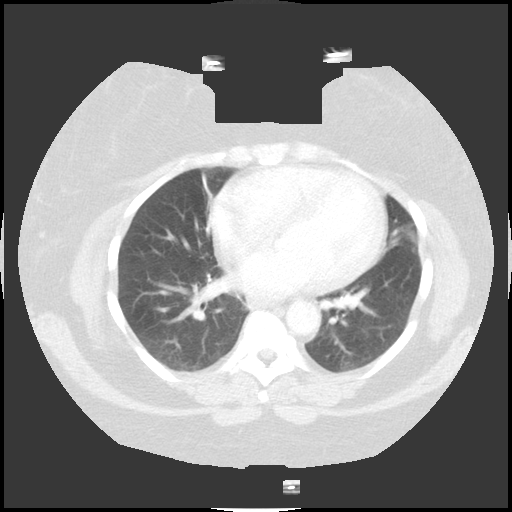
[im 33/63  mediastinal]
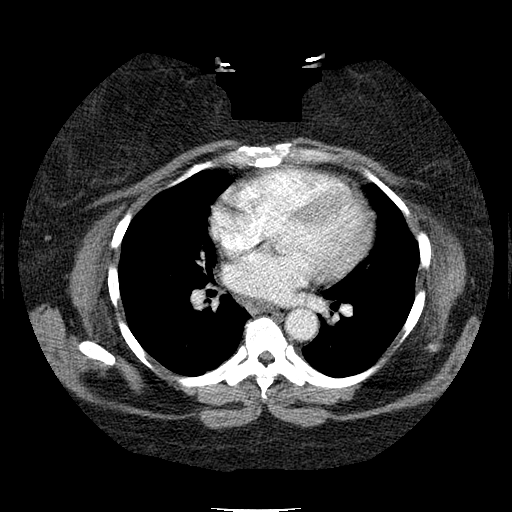
[im 33/63  lung]
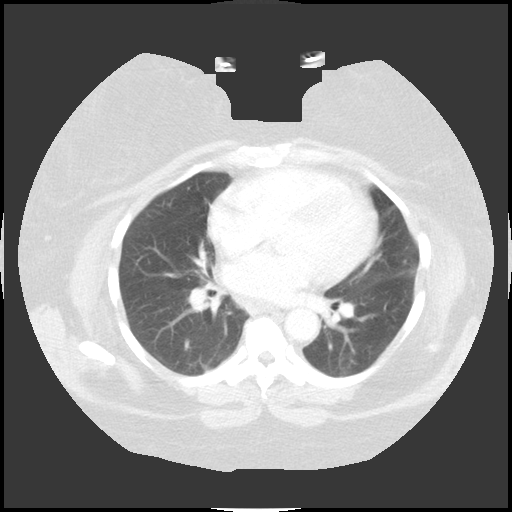
[im 37/63  lung]
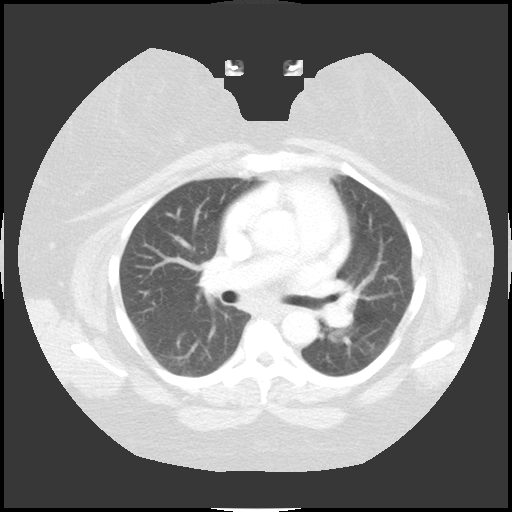
[im 40/63  lung]
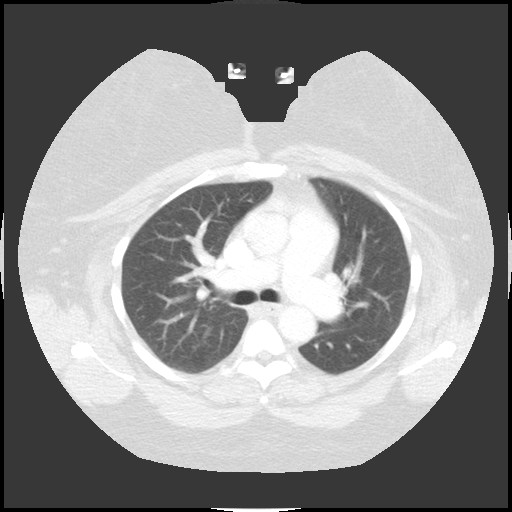
[im 44/63  lung]
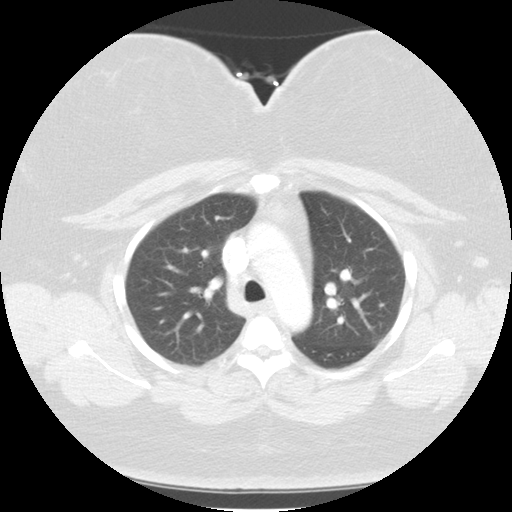
[im 49/63  mediastinal]
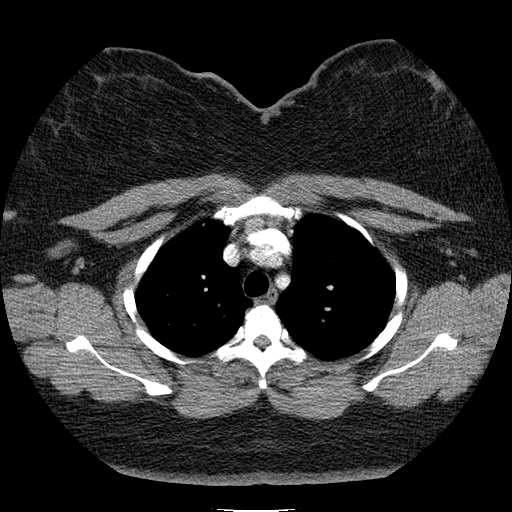
[im 49/63  lung]
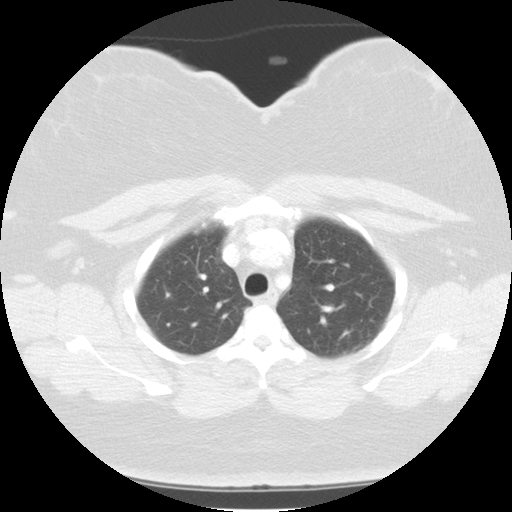
[im 51/63  lung]
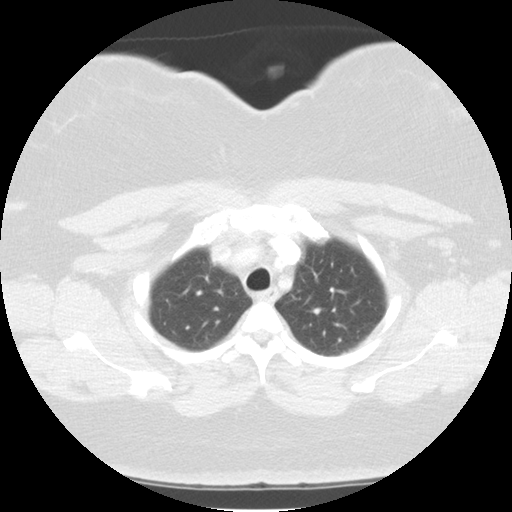
[im 56/63  lung]
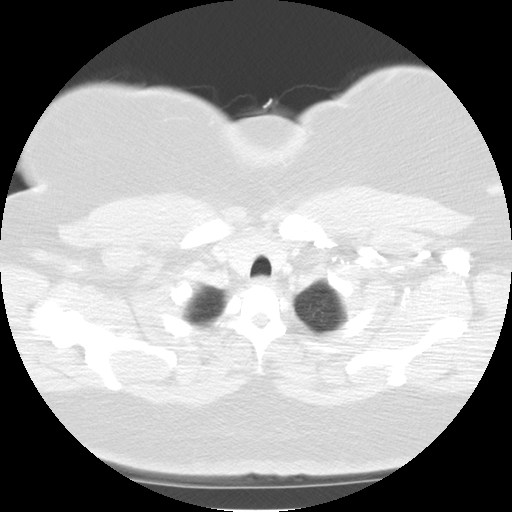
[im 60/63  lung]
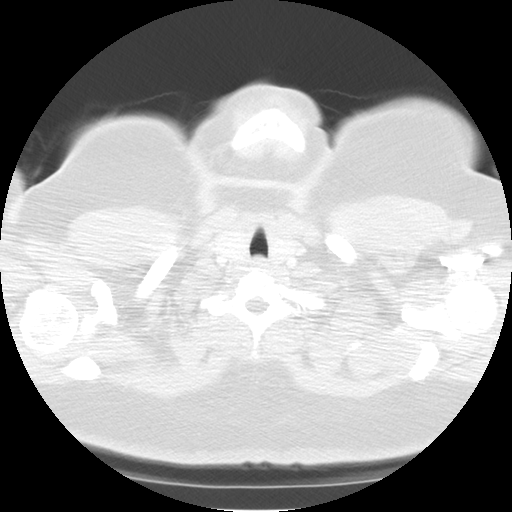

[16 of 32 positions shown; findings below may reference images not displayed]

FINDINGS: Again noted is a well-defined low density soft tissue nodule measuring 2.3 x 1.9 cm in size, essentially unchanged since multiple prior exams dating back to 02/19/03.  Overall heart size is within normal limits.  The lungs are clear.  There is no hilar or mediastinal adenopathy.  There are a few small benign appearing lymph nodes in both axillary regions.  The thyroid gland is normal.
IMPRESSION: Stable anterosuperior soft tissue mass since 02/19/03.  This mass was also reported on a prior study dated 10/07/00 with essentially the same measurements.

## 2007-01-07 ENCOUNTER — Emergency Department (HOSPITAL_COMMUNITY): Admission: EM | Admit: 2007-01-07 | Discharge: 2007-01-07 | Payer: Self-pay | Admitting: Emergency Medicine

## 2007-01-09 ENCOUNTER — Encounter (INDEPENDENT_AMBULATORY_CARE_PROVIDER_SITE_OTHER): Payer: Self-pay | Admitting: *Deleted

## 2007-01-16 ENCOUNTER — Encounter (INDEPENDENT_AMBULATORY_CARE_PROVIDER_SITE_OTHER): Payer: Self-pay | Admitting: Nurse Practitioner

## 2007-01-16 ENCOUNTER — Ambulatory Visit: Payer: Self-pay | Admitting: Family Medicine

## 2007-01-16 LAB — CONVERTED CEMR LAB
ALT: 45 units/L — ABNORMAL HIGH (ref 0–35)
AST: 25 units/L (ref 0–37)
Albumin: 4.2 g/dL (ref 3.5–5.2)
Alkaline Phosphatase: 57 units/L (ref 39–117)
BUN: 11 mg/dL (ref 6–23)
Basophils Absolute: 0.1 10*3/uL (ref 0.0–0.1)
Basophils Relative: 1 % (ref 0–1)
CO2: 27 meq/L (ref 19–32)
Calcium: 9.3 mg/dL (ref 8.4–10.5)
Chloride: 104 meq/L (ref 96–112)
Cholesterol: 156 mg/dL (ref 0–200)
Creatinine, Ser: 0.82 mg/dL (ref 0.40–1.20)
Eosinophils Absolute: 0.5 10*3/uL (ref 0.0–0.7)
Eosinophils Relative: 7 % — ABNORMAL HIGH (ref 0–5)
Glucose, Bld: 91 mg/dL (ref 70–99)
HCT: 41.7 % (ref 36.0–46.0)
HDL: 49 mg/dL (ref >39)
Hemoglobin: 14.1 g/dL (ref 12.0–15.0)
LDL Cholesterol: 57 mg/dL (ref 0–99)
Lymphocytes Relative: 44 % (ref 12–46)
Lymphs Abs: 3.5 10*3/uL — ABNORMAL HIGH (ref 0.7–3.3)
MCHC: 33.8 g/dL (ref 30.0–36.0)
MCV: 87.8 fL (ref 78.0–100.0)
Monocytes Absolute: 0.5 10*3/uL (ref 0.2–0.7)
Monocytes Relative: 6 % (ref 3–11)
Neutro Abs: 3.4 10*3/uL (ref 1.7–7.7)
Neutrophils Relative %: 42 % — ABNORMAL LOW (ref 43–77)
Platelets: 279 10*3/uL (ref 150–400)
Potassium: 3.7 meq/L (ref 3.5–5.3)
RBC: 4.75 M/uL (ref 3.87–5.11)
RDW: 12.8 % (ref 11.5–14.0)
Sodium: 141 meq/L (ref 135–145)
TSH: 0.449 microintl units/mL (ref 0.350–5.50)
Total Bilirubin: 0.5 mg/dL (ref 0.3–1.2)
Total CHOL/HDL Ratio: 3.2
Total Protein: 7.4 g/dL (ref 6.0–8.3)
Triglycerides: 251 mg/dL — ABNORMAL HIGH (ref <150)
VLDL: 50 mg/dL — ABNORMAL HIGH (ref 0–40)
WBC: 7.9 10*3/uL (ref 4.0–10.5)

## 2007-01-17 ENCOUNTER — Ambulatory Visit: Payer: Self-pay | Admitting: *Deleted

## 2007-03-11 ENCOUNTER — Ambulatory Visit: Payer: Self-pay | Admitting: Family Medicine

## 2007-03-14 IMAGING — MR MR [PERSON_NAME] LOW JT W/O CM*L*
4 of 5 series · 19 of 40 positions shown · IV contrast (agent unspecified)
Comparison: none

CLINICAL DATA: Left knee pain and swelling three to four months.  Joint effusion.  Limited range of motion. 
 MRI LEFT KNEE WITHOUT CONTRAST:
TECHNIQUE: Multiplanar, multisequence MR imaging of the knee was performed following the standard protocol.  No intravenous contrast was administered.

[Series 2: PD fat-sat · axial · 5.0mm · 0.33mm/px · z∈[-90,+17]mm · 8 of 19 slices shown (1 of 3)]
[im 1/19]
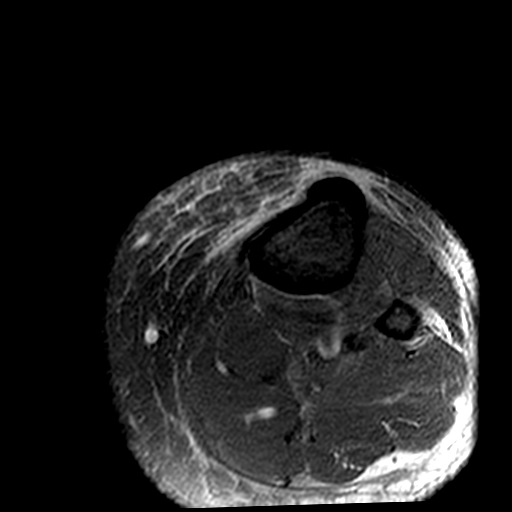
[im 3/19]
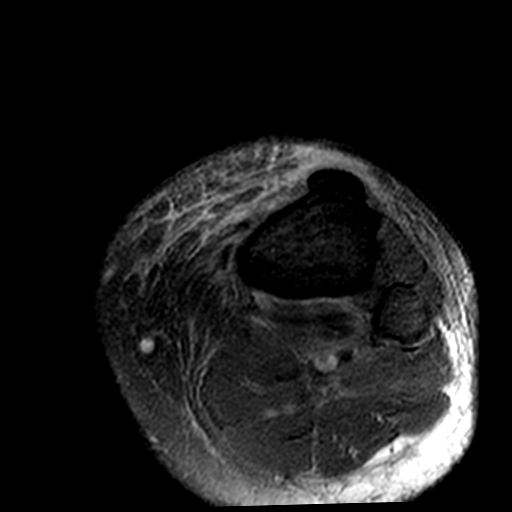
[im 6/19]
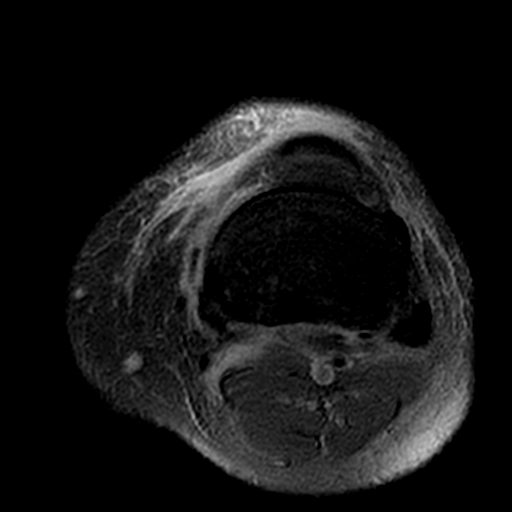
[im 8/19]
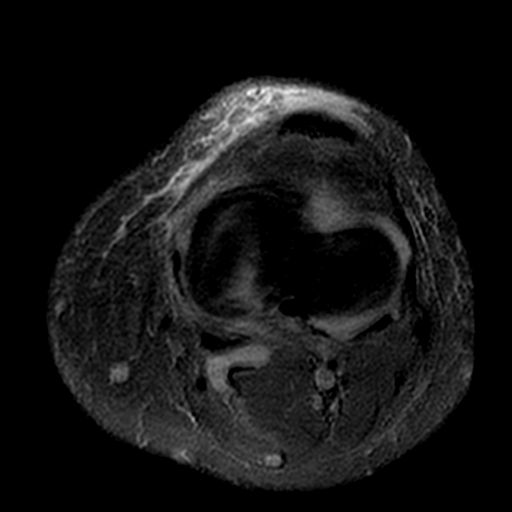
[im 11/19]
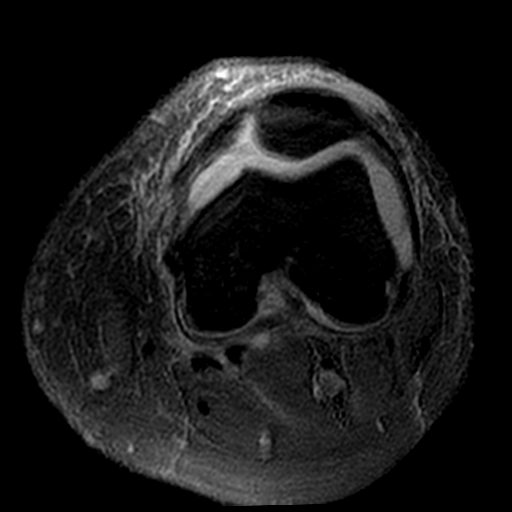
[im 13/19]
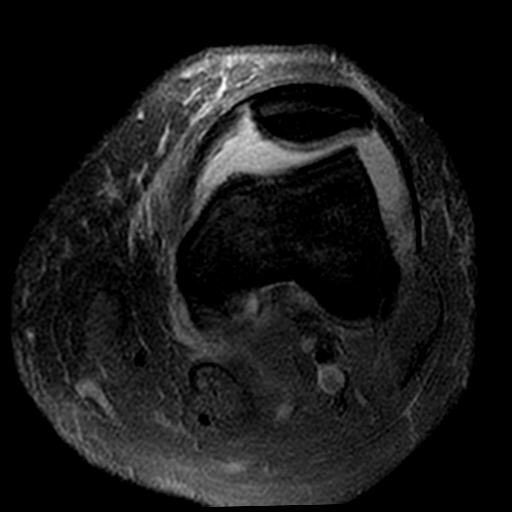
[im 16/19]
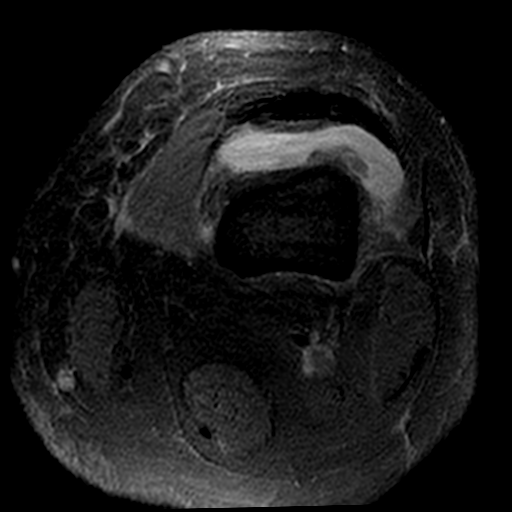
[im 19/19]
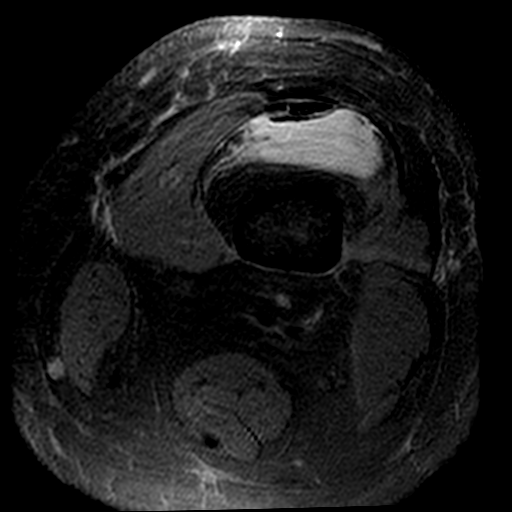

[Series 3: PD fat-sat · sagittal · 5.0mm · 0.33mm/px · 5 of 19 slices shown (2 of 3)]
[im 1/19]
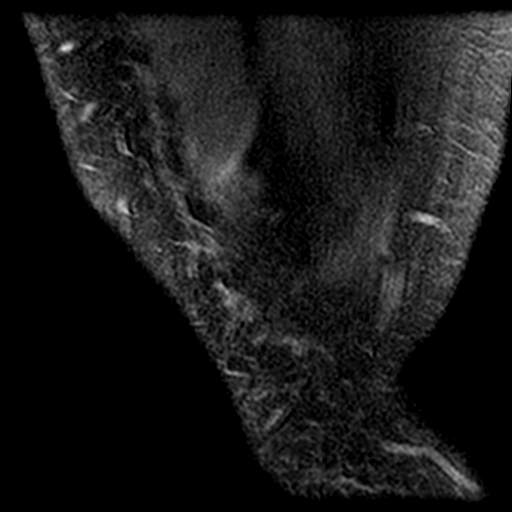
[im 3/19]
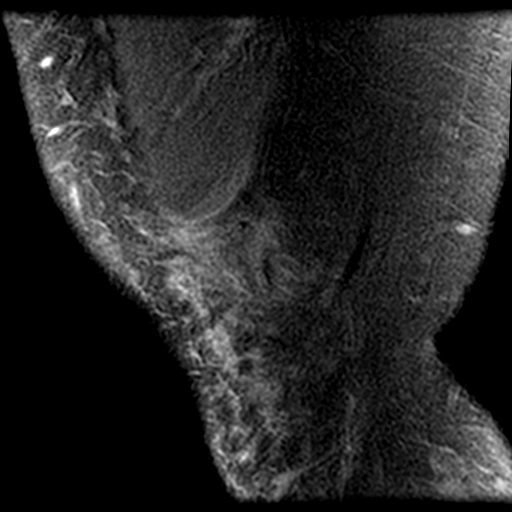
[im 6/19]
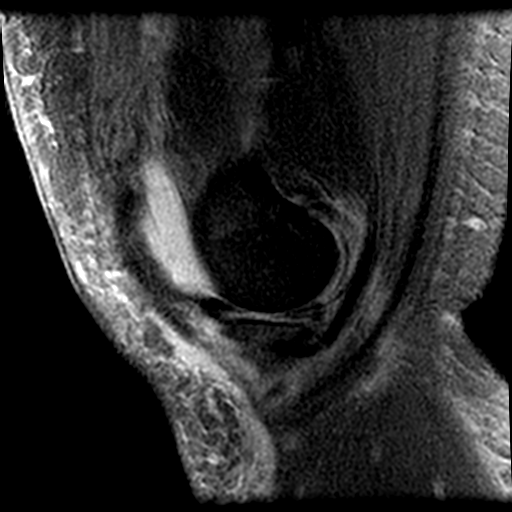
[im 11/19]
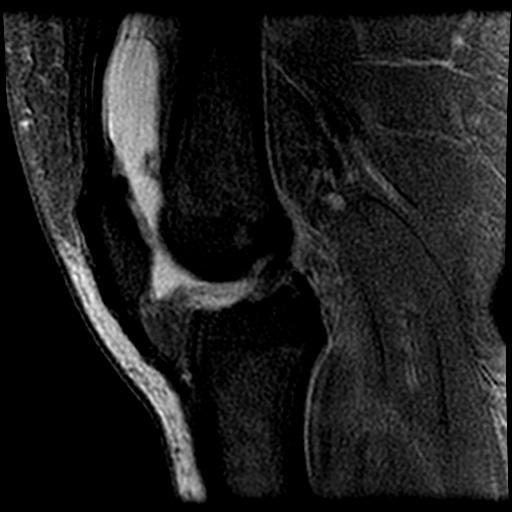
[im 16/19]
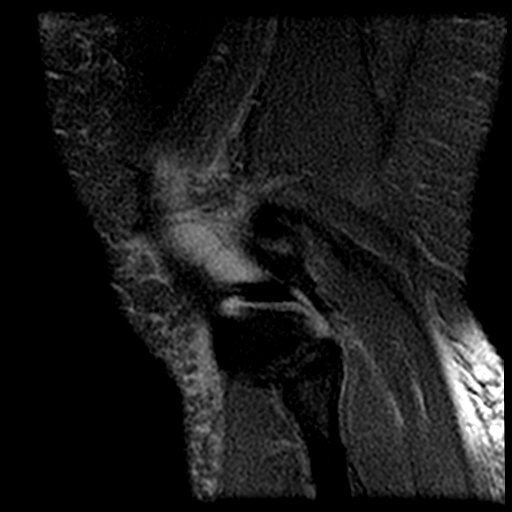

[Series 4: T1 · coronal · 4.0mm · 0.33mm/px · 3 of 22 slices shown]
[im 4/22]
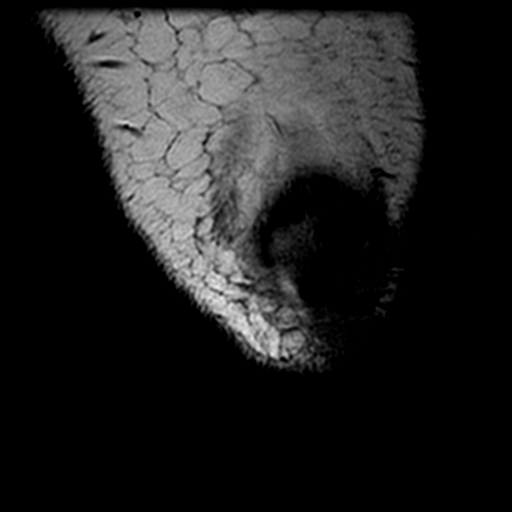
[im 13/22]
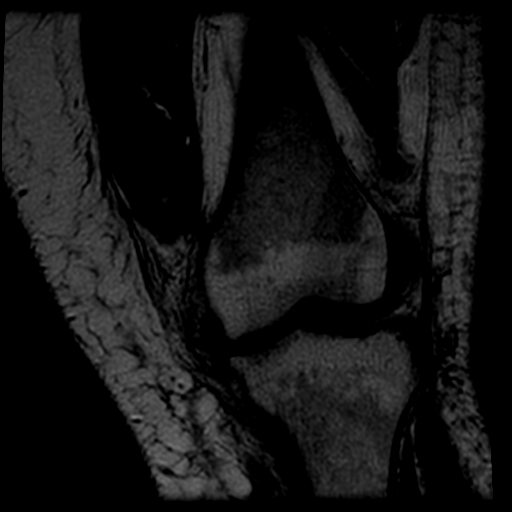
[im 19/22]
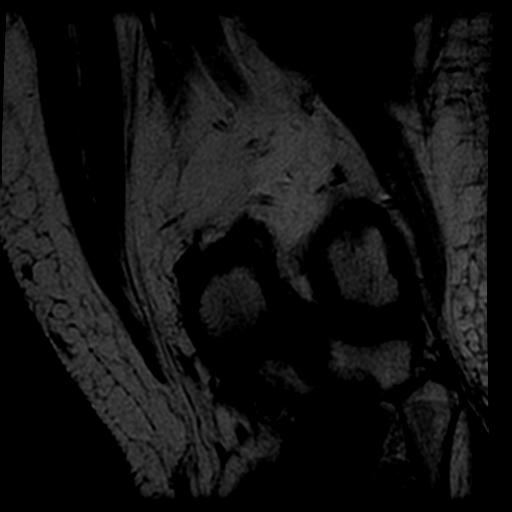

[Series 5: PD fat-sat · coronal · 4.0mm · 0.33mm/px · 3 of 22 slices shown (3 of 3)]
[im 4/22]
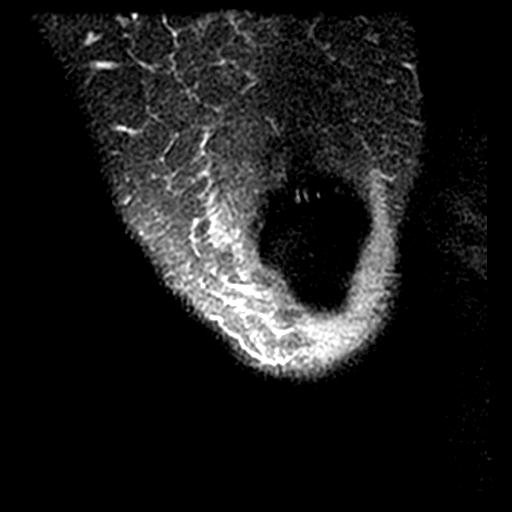
[im 13/22]
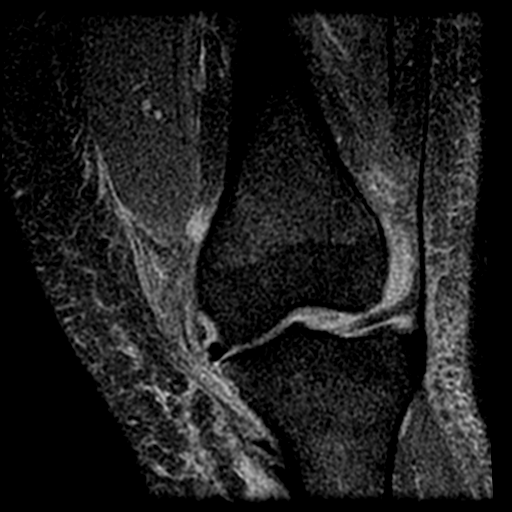
[im 19/22]
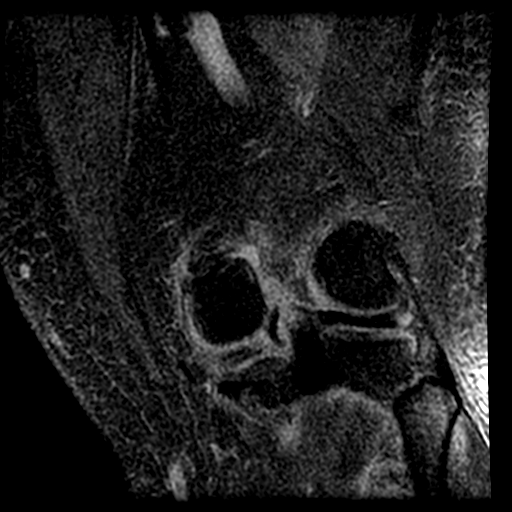

[19 of 40 positions shown; findings below may reference images not displayed]

FINDINGS: There is a radial tear at the root of the posterior horn of the medial meniscus with a displaced meniscal fragment lying adjacent to the posterior cruciate ligament adjacent to the site of the tear.  The lateral meniscus is intact.  Cruciate and collateral ligaments are intact.  There is a moderate joint effusion as well as moderate edema in the soft tissues anterior to the patellar tendon and anterior tibial tubercle.  There is some slight marginal spur formation on the medial femoral condyle and medial tibial plateau.  
 The study is somewhat degraded by motion artifact.
IMPRESSION: Complex tear of the posterior horn of the medial meniscus with a displaced meniscal fragment.  I suspect that there is a complete radial tear at the meniscal root in addition to the displaced meniscal fragment.

## 2007-03-15 ENCOUNTER — Ambulatory Visit (HOSPITAL_COMMUNITY): Admission: RE | Admit: 2007-03-15 | Discharge: 2007-03-15 | Payer: Self-pay | Admitting: Family Medicine

## 2007-04-04 ENCOUNTER — Ambulatory Visit: Payer: Self-pay | Admitting: Internal Medicine

## 2007-04-05 ENCOUNTER — Inpatient Hospital Stay (HOSPITAL_COMMUNITY): Admission: EM | Admit: 2007-04-05 | Discharge: 2007-04-09 | Payer: Self-pay | Admitting: Emergency Medicine

## 2007-04-05 ENCOUNTER — Encounter (INDEPENDENT_AMBULATORY_CARE_PROVIDER_SITE_OTHER): Payer: Self-pay | Admitting: Internal Medicine

## 2007-05-03 IMAGING — CR DG LUMBAR SPINE COMPLETE 4+V
5 series · 5 of 5 positions shown · non-contrast
Comparison: none

CLINICAL DATA: Low back pain

Lumbar spine five-view:
Comparison 05/25/2003. There is a very mild levoscoliosis of the lumbar spine
without evidence of underlying vertebral anomaly. Advanced facet degenerative
hypertrophy L4-L5 and L5-S1. Vertebral body and intervertebral disc heights well
maintained throughout.

[t l-spine a.p.]
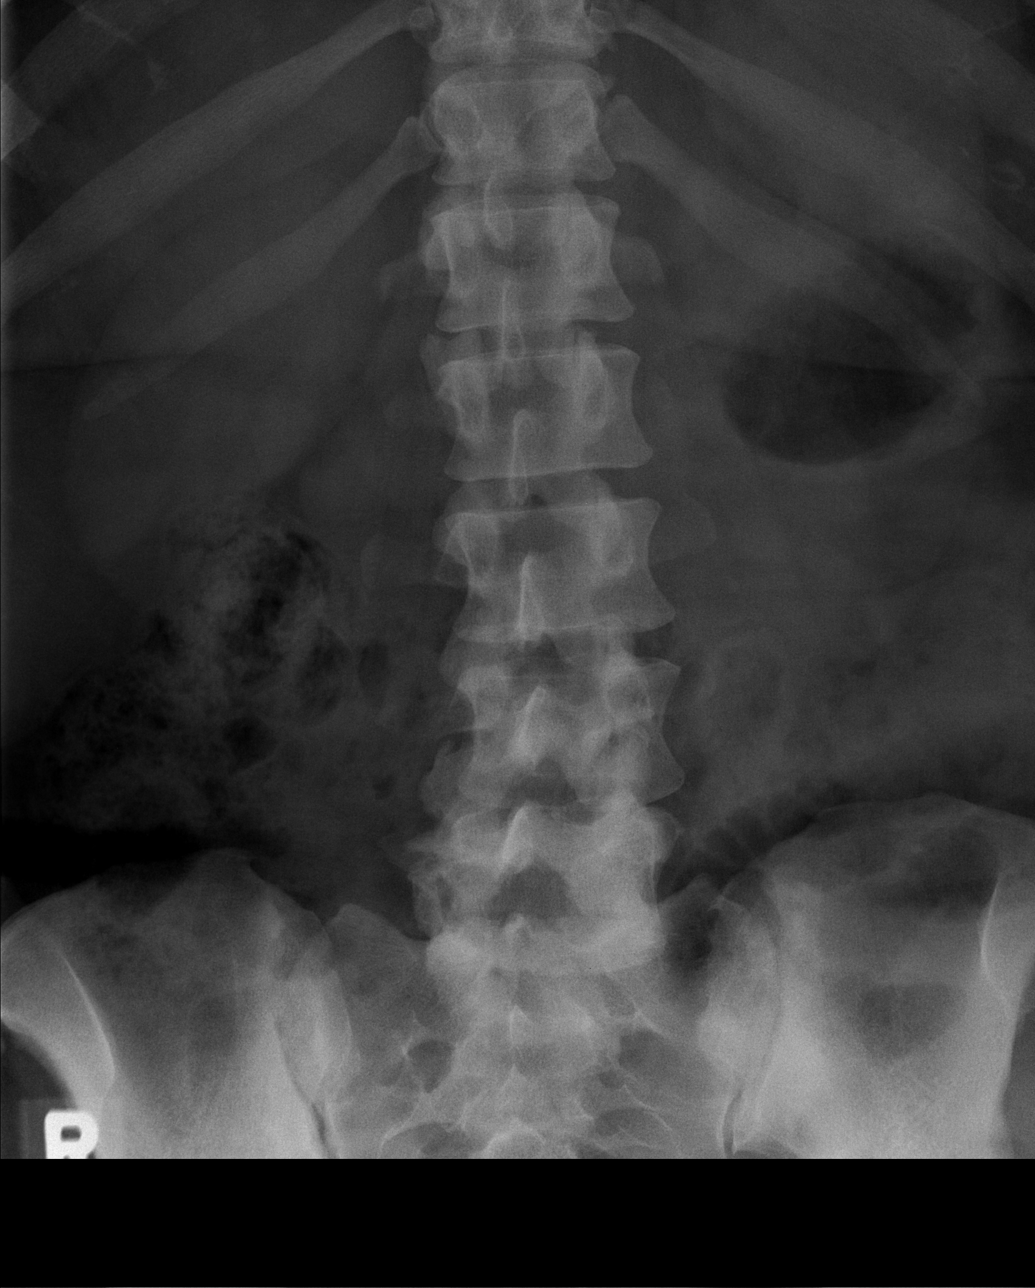

[t l-spine oblique exposure (1 of 2)]
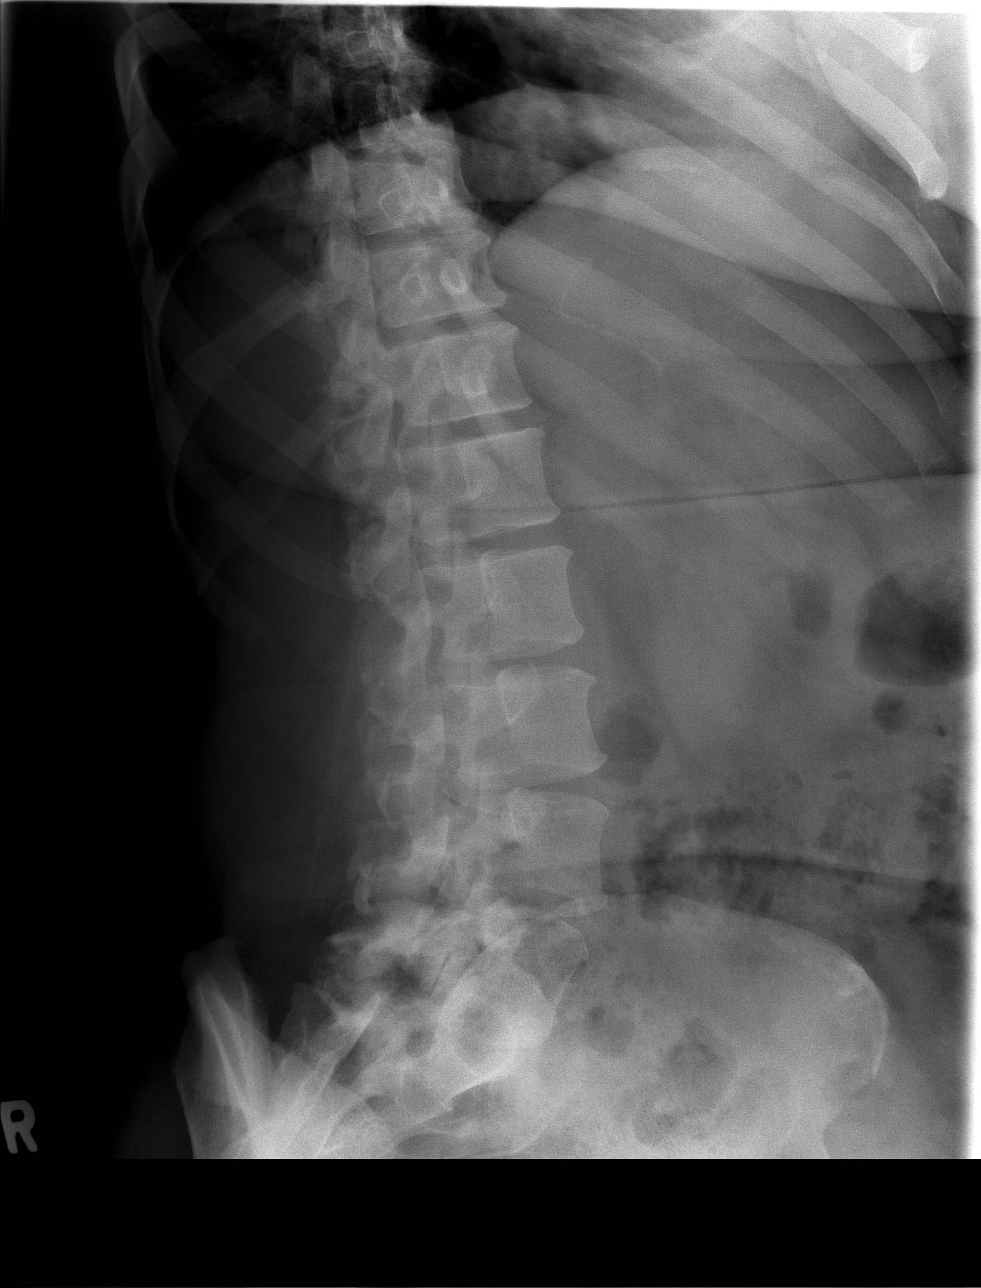

[t l-spine oblique exposure (2 of 2)]
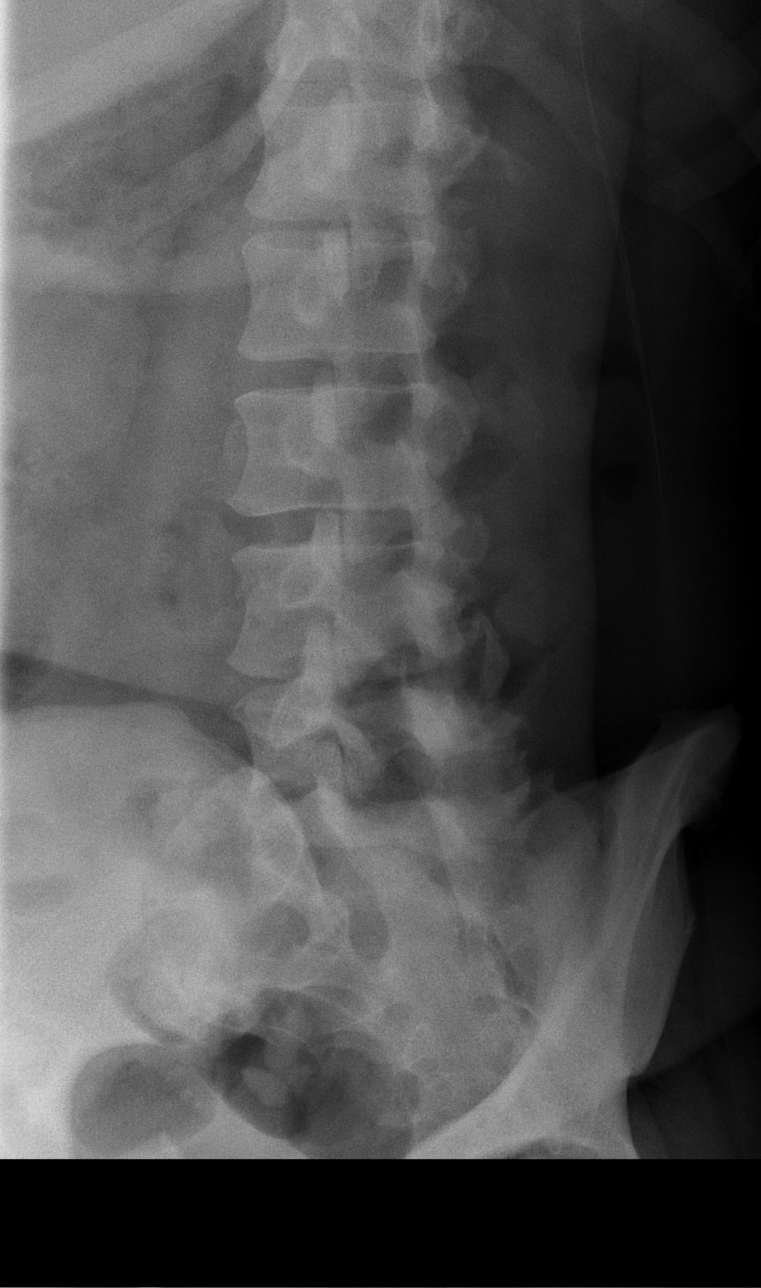

[t l-spine lat]
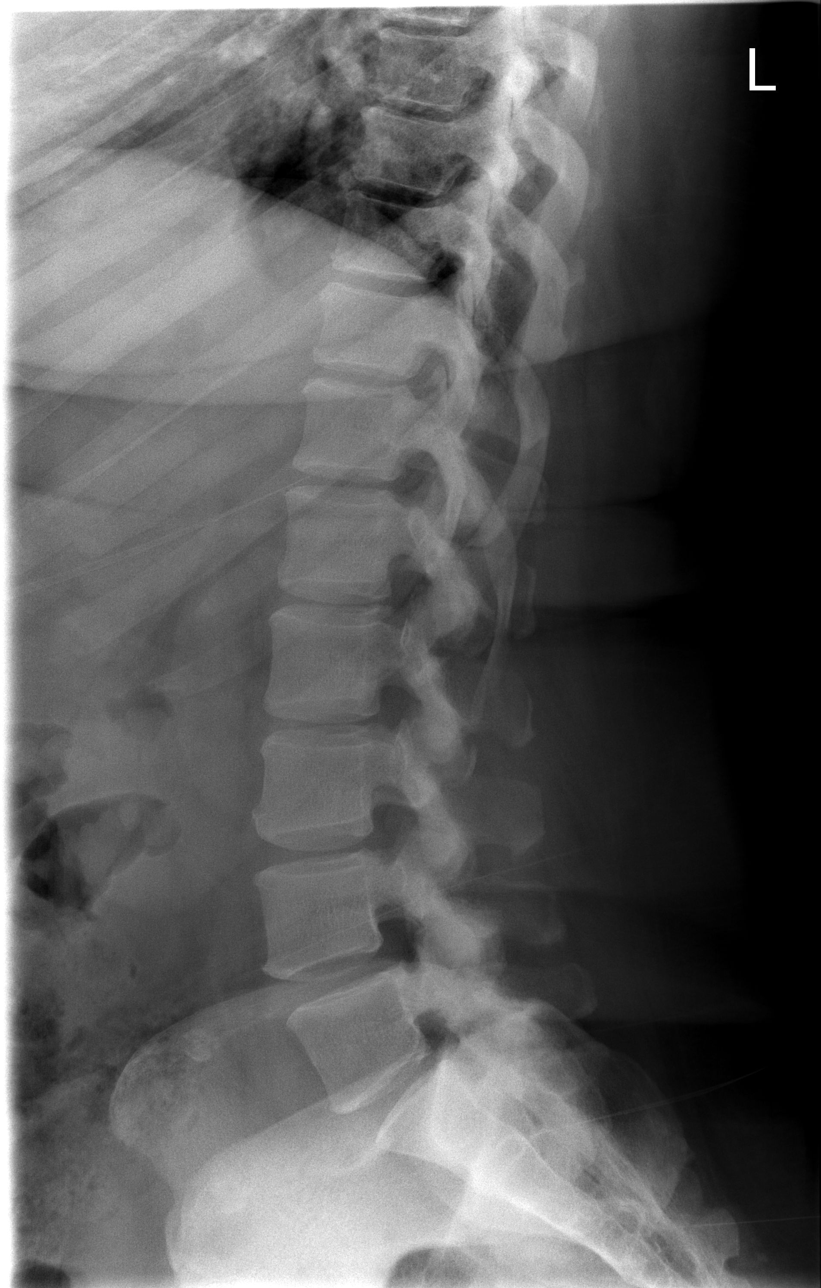

[t l-spine l5-s1 spot *]
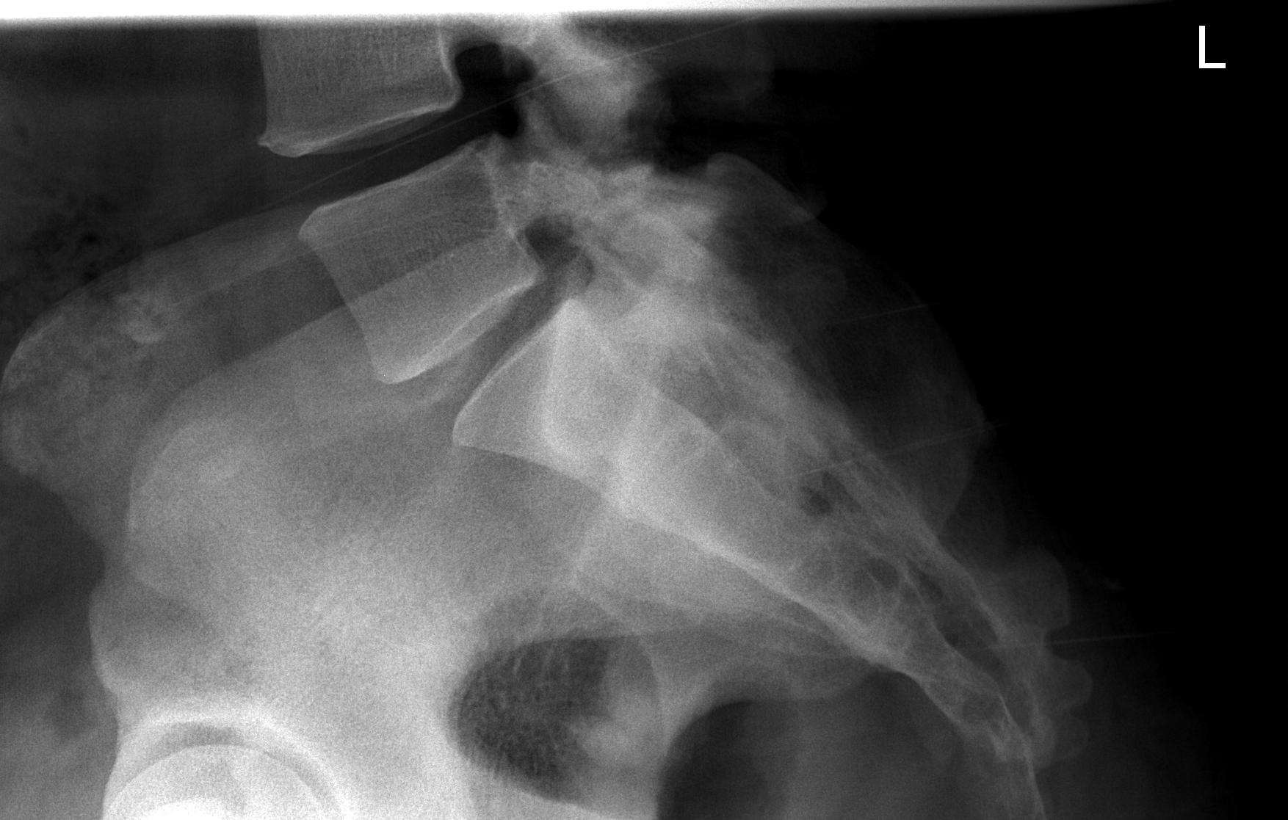

[5 of 5 positions shown; findings below may reference images not displayed]

IMPRESSION: 1. Negative for acute bone abnormality
2. Stable degenerative changes as above

## 2007-05-14 IMAGING — MR MR LUMBAR SPINE W/O CM
4 of 6 series · 15 of 48 positions shown · IV contrast (agent unspecified)
Comparison: none

CLINICAL DATA: Pain and spasm low back and left leg for two weeks.  Plain films unremarkable. 
MRI LUMBAR SPINE WITHOUT CONTRAST:
TECHNIQUE: Multiplanar and multiecho pulse sequences of the lumbar spine, to include the lower thoracic and upper sacral regions, were obtained according to standard protocol without IV contrast.

[Series 4: T2 · sagittal · 4.0mm · 0.44mm/px · 6 of 11 slices shown (1 of 2)]
[im 1/11]
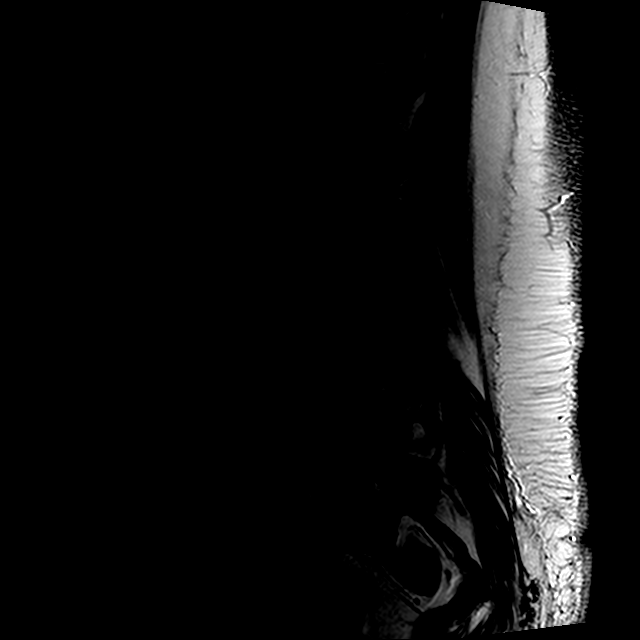
[im 3/11]
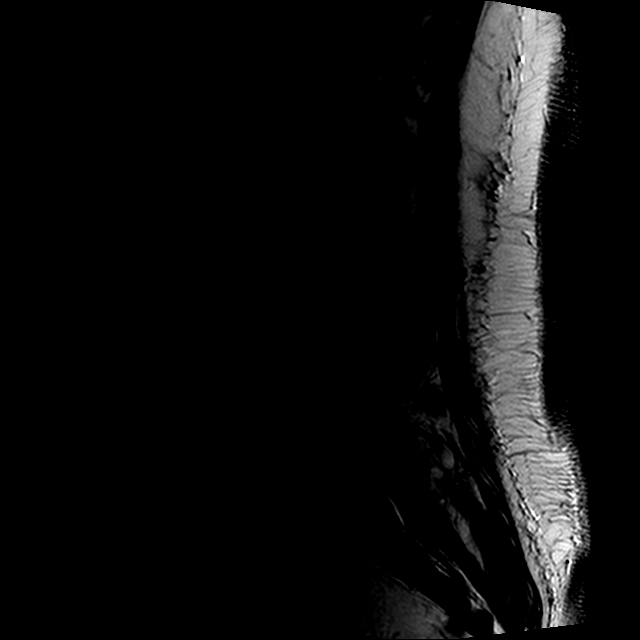
[im 5/11]
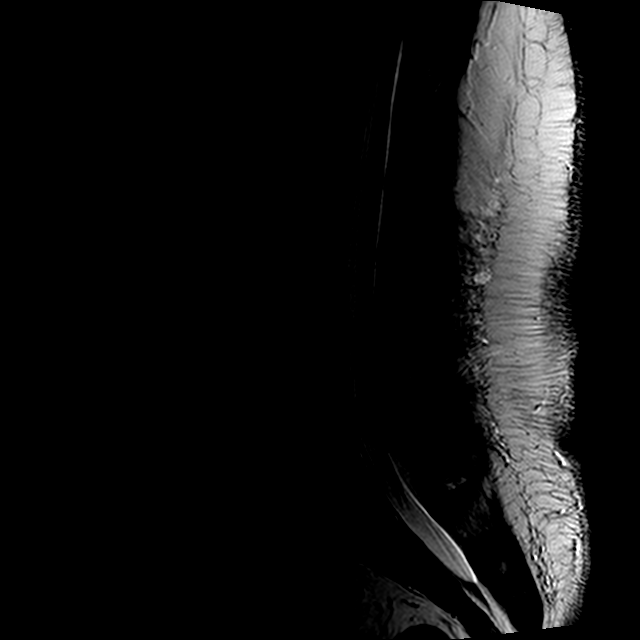
[im 7/11]
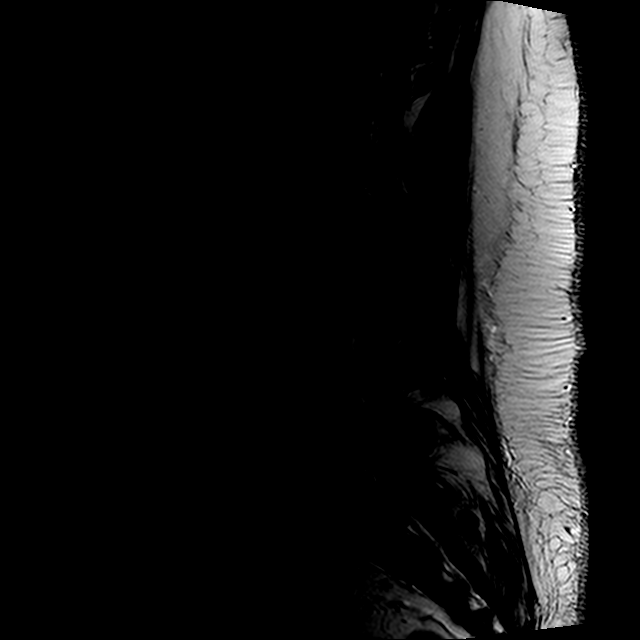
[im 9/11]
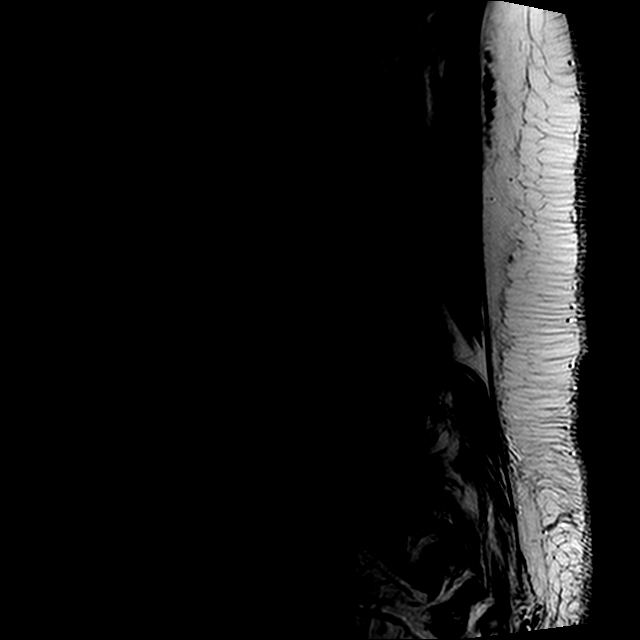
[im 11/11]
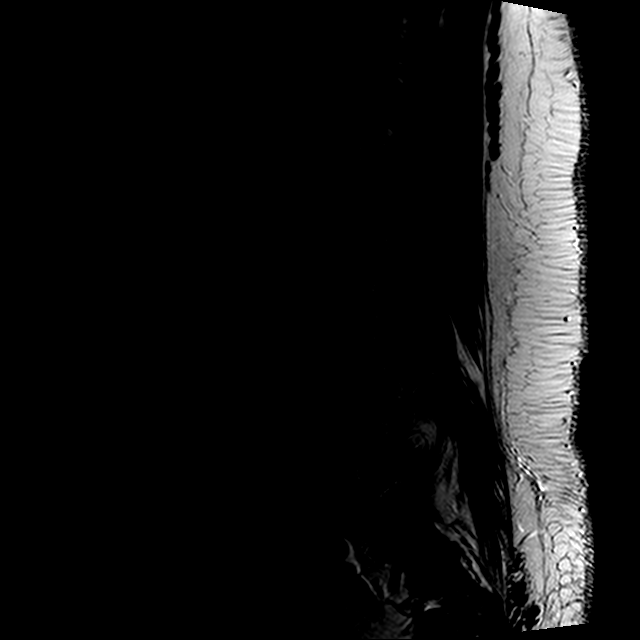

[Series 6: T1 · sagittal · 4.0mm · 0.44mm/px · 3 of 11 slices shown (1 of 2)]
[im 3/11]
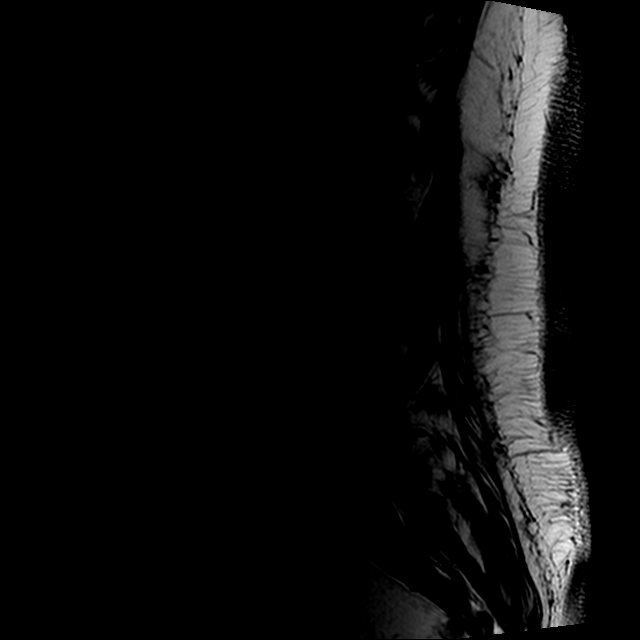
[im 7/11]
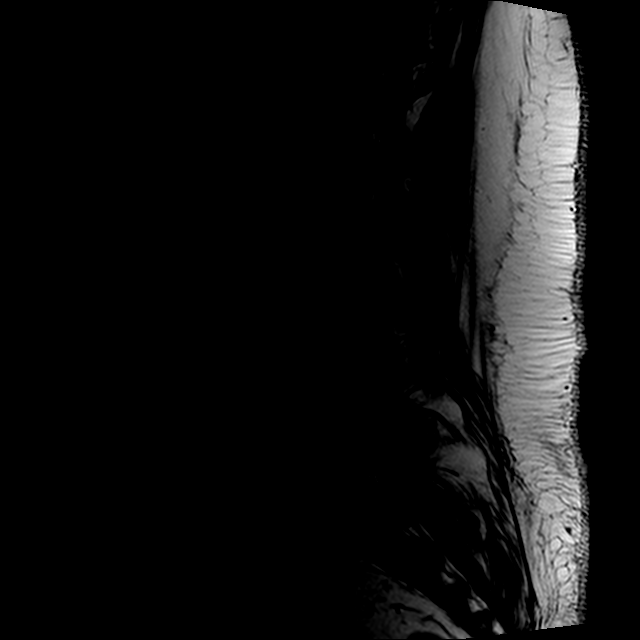
[im 11/11]
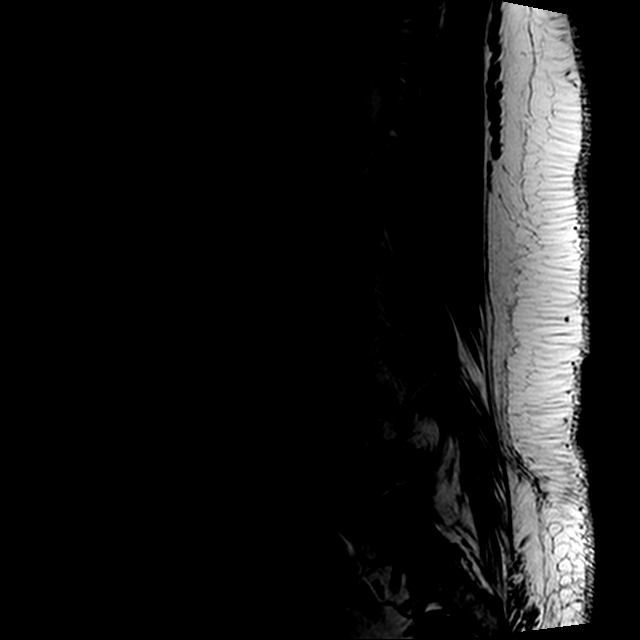

[Series 9: T1 · axial · 4.0mm · 0.43mm/px · z∈[-112,+63]mm · 3 of 23 slices shown (2 of 2)]
[im 5/23]
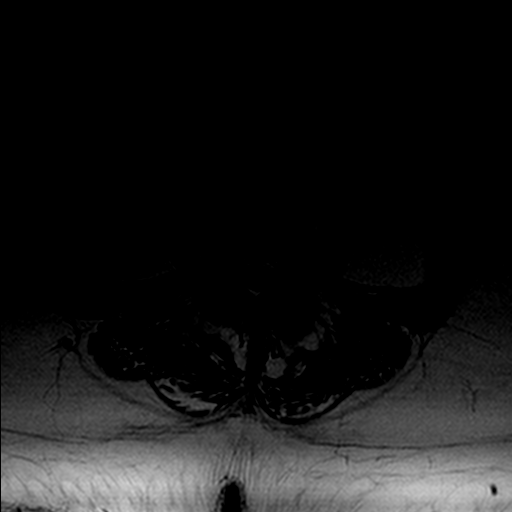
[im 13/23]
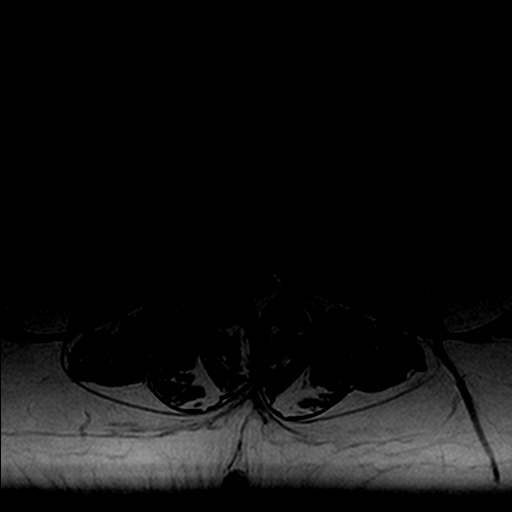
[im 21/23]
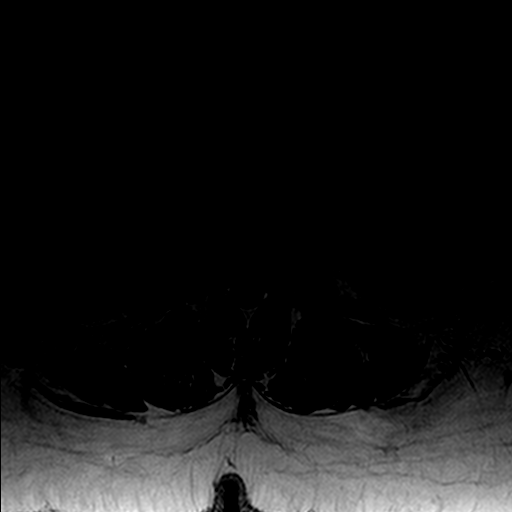

[Series 10: T2 · axial · 4.0mm · 0.43mm/px · z∈[-112,+63]mm · 3 of 23 slices shown (2 of 2)]
[im 5/23]
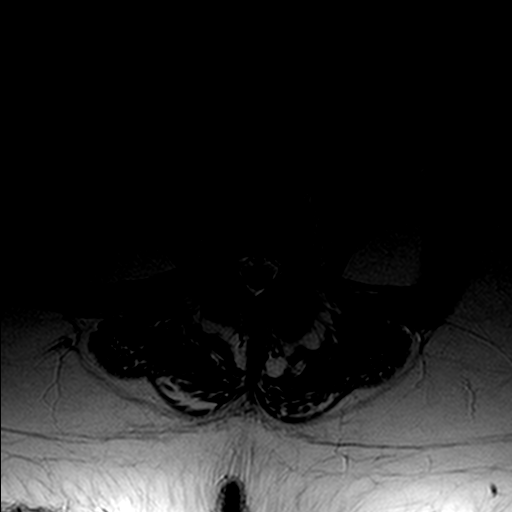
[im 13/23]
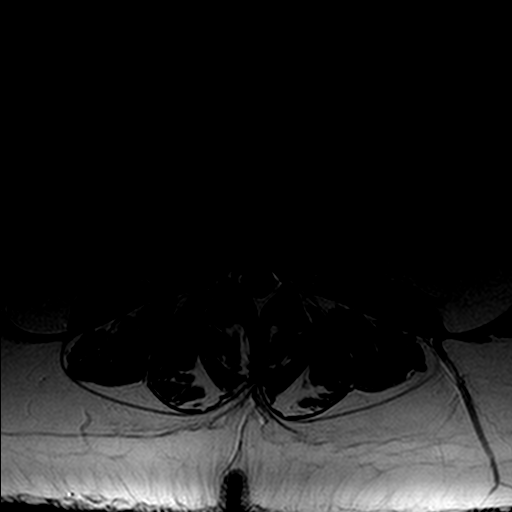
[im 21/23]
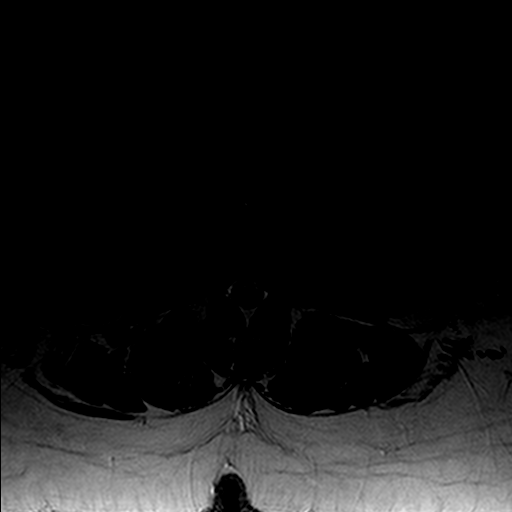

[15 of 48 positions shown; findings below may reference images not displayed]

FINDINGS: Normal alignment.  Marrow signal mildly heterogeneous but no focal areas of marrow replacement.  No pre- or paravertebral masses.  Very mild disc desiccation L3-4 and L4-5.  Conus normal. 
L1-2:  Normal interspace.
L2-3:  Normal interspace.
L3-4:  Moderate facet arthropathy.  Mild disc desiccation.  No stenosis or disc protrusion. 
L4-5:  Advanced facet arthropathy.  Mild annular bulging.  No stenosis or disc protrusion. 
L5-S1:  Advanced facet arthropathy.  No stenosis or disc protrusion.
The patient?s facet arthropathy appears worse at both L4-5 and L5-S1 on the left.  If there are signs and symptoms referable to facet disease, a trial of facet injections using steroid and Sensorcaine may be helpful as a diagnostic and potentially therapeutic maneuver.
IMPRESSION: 1.  Advanced lower lumbar facet arthropathy, worse on the left at L4-5 and L5-S1. 
2.  No significant disc protrusion, spinal stenosis, or nerve root encroachment.

## 2007-05-24 ENCOUNTER — Ambulatory Visit: Payer: Self-pay | Admitting: Internal Medicine

## 2007-06-04 ENCOUNTER — Ambulatory Visit (HOSPITAL_COMMUNITY): Admission: RE | Admit: 2007-06-04 | Discharge: 2007-06-04 | Payer: Self-pay | Admitting: Family Medicine

## 2007-07-25 IMAGING — MG MM DIGITAL SCREENING BILAT
5 series · 5 of 5 positions shown · non-contrast
Comparison: none

DG SCREEN MAMMOGRAM BILATERAL
Bilateral CC and MLO view(s) were taken.

SCREENING MAMMOGRAM:
There is a fibrofatty pattern.  No masses or malignant type calcifications are identified.  
Compared with prior studies.

[R CC]
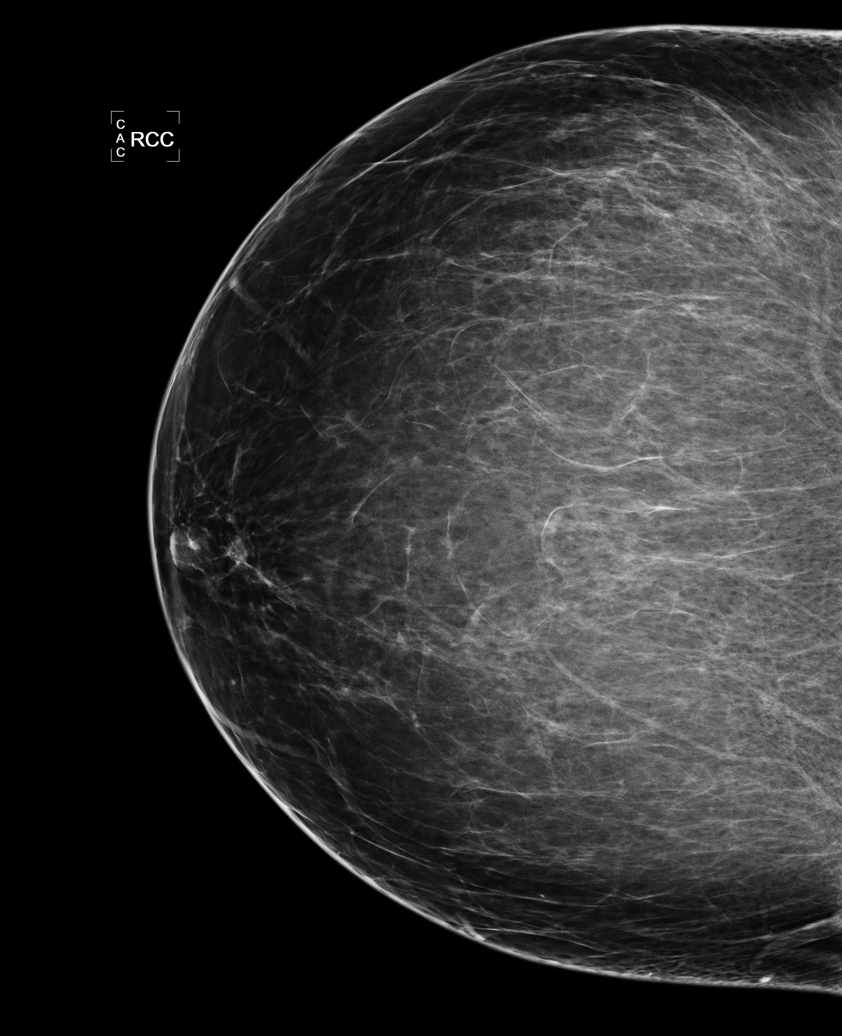

[R MLO]
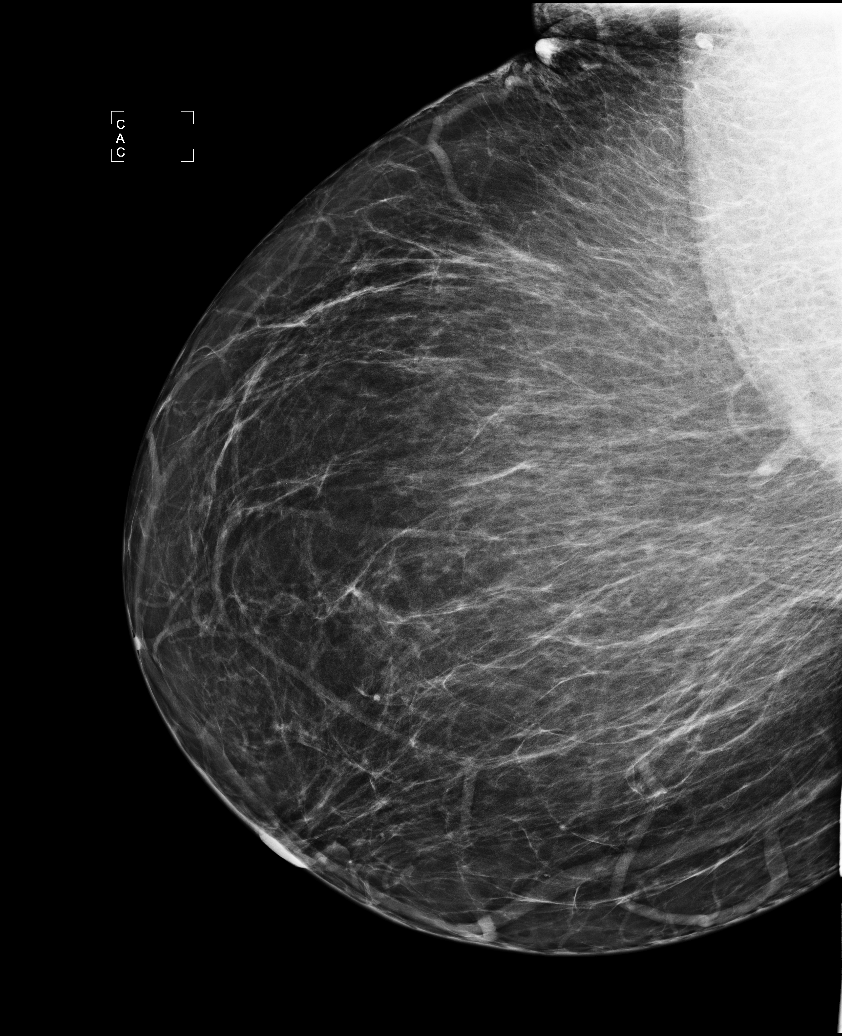

[L CC (1 of 2)]
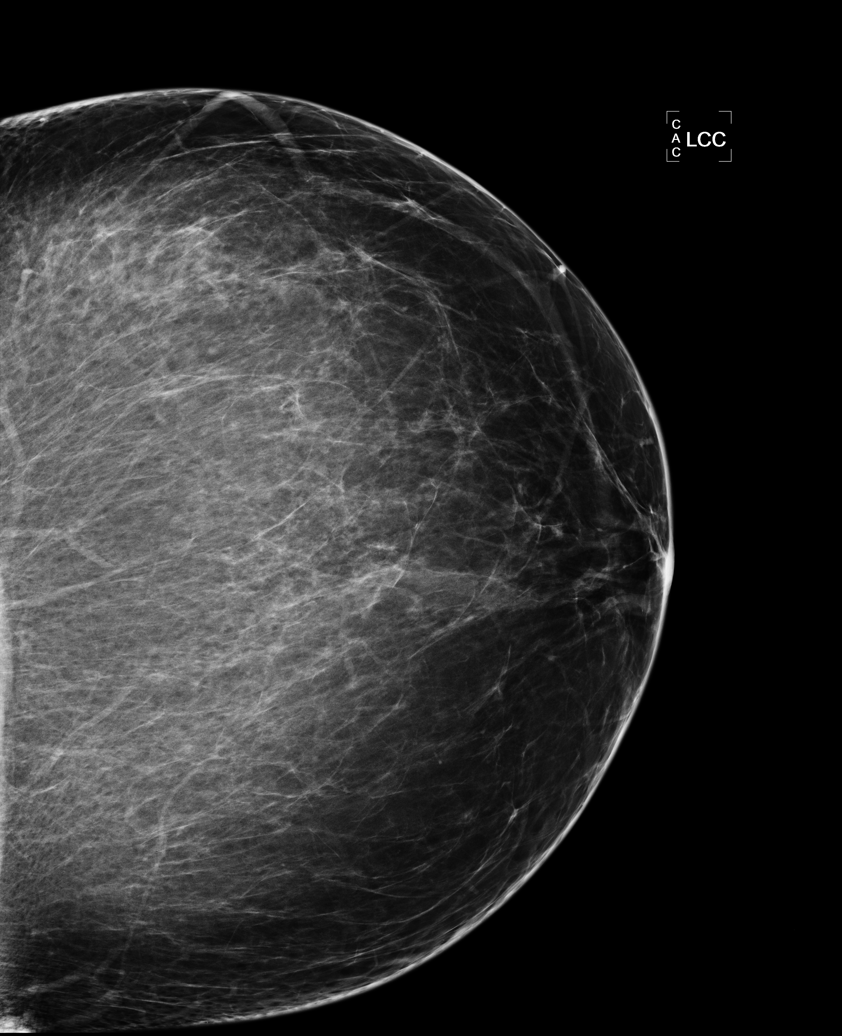

[L MLO]
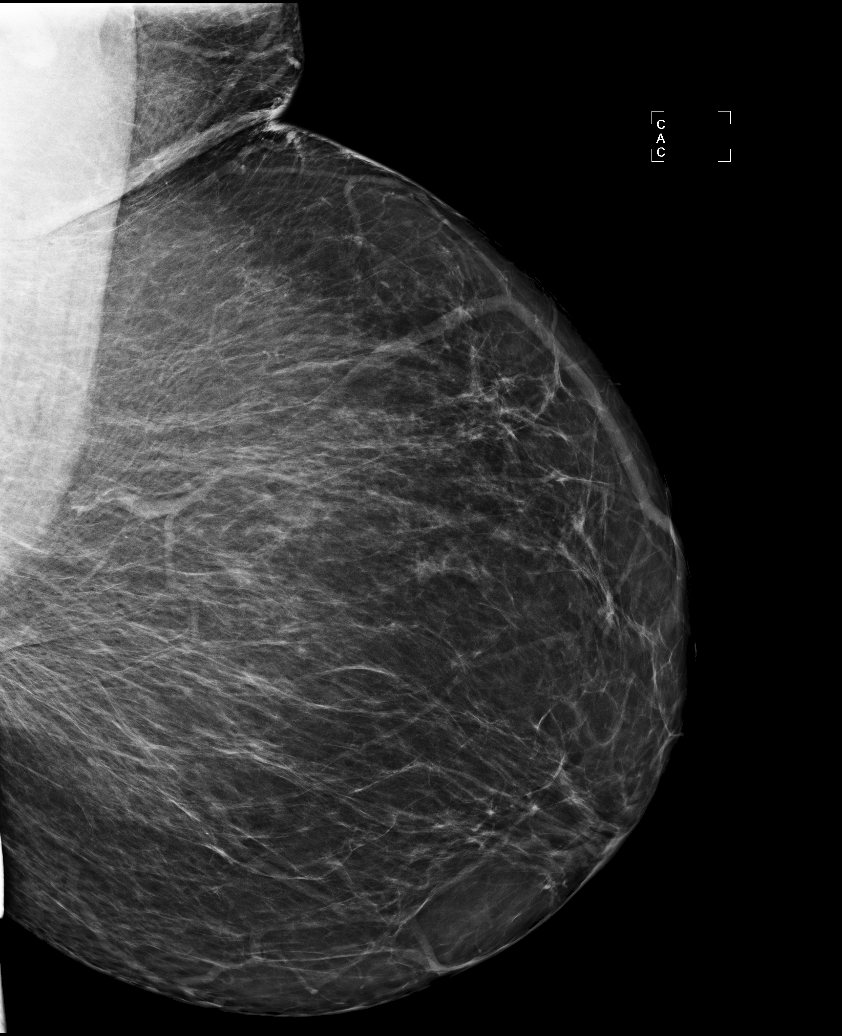

[L CC (2 of 2)]
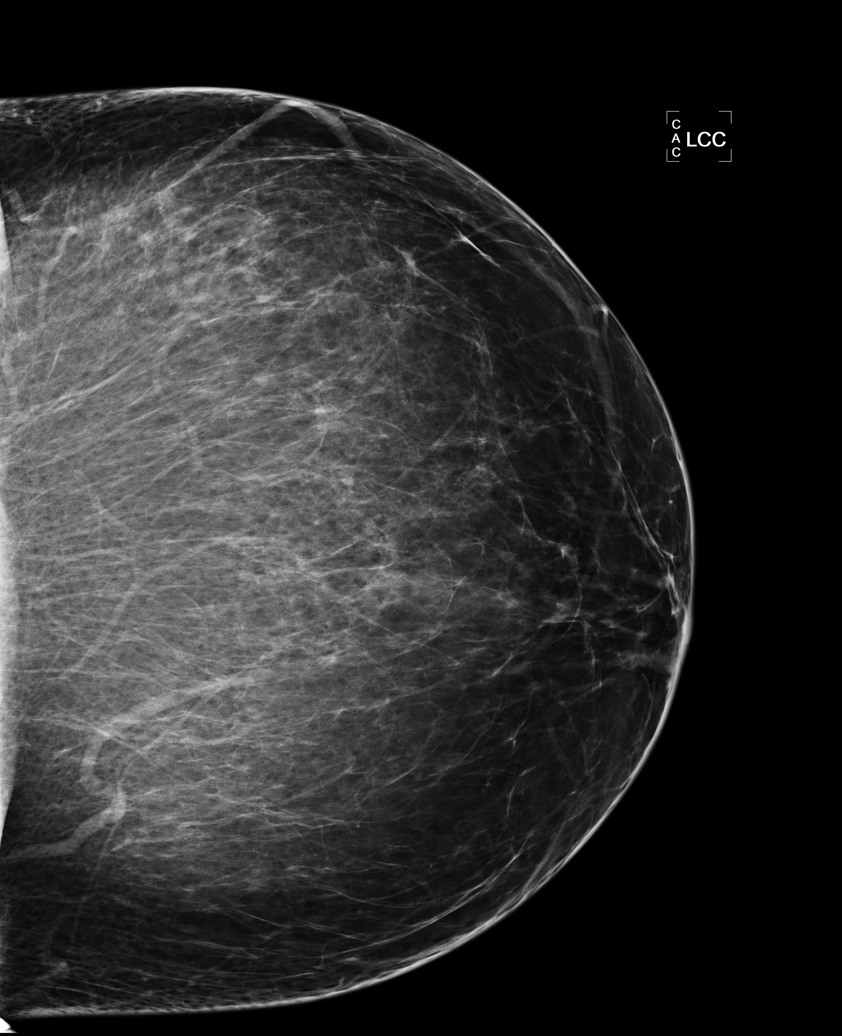

[5 of 5 positions shown; findings below may reference images not displayed]

IMPRESSION: No specific mammographic evidence of malignancy.  Next screening mammogram is recommended in one 
year.

ASSESSMENT: Negative - BI-RADS 1

Screening mammogram in 1 year.
ANALYZED BY COMPUTER AIDED DETECTION. , THIS PROCEDURE WAS A DIGITAL MAMMOGRAM.

## 2007-08-06 ENCOUNTER — Encounter (INDEPENDENT_AMBULATORY_CARE_PROVIDER_SITE_OTHER): Payer: Self-pay | Admitting: Family Medicine

## 2007-08-06 ENCOUNTER — Ambulatory Visit: Payer: Self-pay | Admitting: Internal Medicine

## 2007-08-06 LAB — CONVERTED CEMR LAB
Anti Nuclear Antibody(ANA): NEGATIVE
BUN: 12 mg/dL (ref 6–23)
Basophils Absolute: 0.1 10*3/uL (ref 0.0–0.1)
Basophils Relative: 1 % (ref 0–1)
CO2: 29 meq/L (ref 19–32)
Calcium: 9.8 mg/dL (ref 8.4–10.5)
Chloride: 100 meq/L (ref 96–112)
Creatinine, Ser: 0.9 mg/dL (ref 0.40–1.20)
Eosinophils Absolute: 0.6 10*3/uL (ref 0.0–0.7)
Eosinophils Relative: 9 % — ABNORMAL HIGH (ref 0–5)
Free T4: 1.32 ng/dL (ref 0.89–1.80)
Glucose, Bld: 106 mg/dL — ABNORMAL HIGH (ref 70–99)
HCT: 43.1 % (ref 36.0–46.0)
Hemoglobin: 14.3 g/dL (ref 12.0–15.0)
Lymphocytes Relative: 47 % — ABNORMAL HIGH (ref 12–46)
Lymphs Abs: 3.1 10*3/uL (ref 0.7–4.0)
MCHC: 33.2 g/dL (ref 30.0–36.0)
MCV: 89 fL (ref 78.0–100.0)
Magnesium: 1.9 mg/dL (ref 1.5–2.5)
Monocytes Absolute: 0.4 10*3/uL (ref 0.1–1.0)
Monocytes Relative: 6 % (ref 3–12)
Neutro Abs: 2.5 10*3/uL (ref 1.7–7.7)
Neutrophils Relative %: 37 % — ABNORMAL LOW (ref 43–77)
Platelets: 258 10*3/uL (ref 150–400)
Potassium: 3.5 meq/L (ref 3.5–5.3)
RBC: 4.84 M/uL (ref 3.87–5.11)
RDW: 13.1 % (ref 11.5–15.5)
Sodium: 143 meq/L (ref 135–145)
TSH: 0.681 microintl units/mL (ref 0.350–5.50)
WBC: 6.7 10*3/uL (ref 4.0–10.5)

## 2007-08-13 ENCOUNTER — Emergency Department (HOSPITAL_COMMUNITY): Admission: EM | Admit: 2007-08-13 | Discharge: 2007-08-13 | Payer: Self-pay | Admitting: Emergency Medicine

## 2007-08-20 ENCOUNTER — Emergency Department (HOSPITAL_COMMUNITY): Admission: EM | Admit: 2007-08-20 | Discharge: 2007-08-20 | Payer: Self-pay | Admitting: Emergency Medicine

## 2007-09-03 ENCOUNTER — Ambulatory Visit: Payer: Self-pay | Admitting: Family Medicine

## 2007-09-24 ENCOUNTER — Ambulatory Visit: Payer: Self-pay | Admitting: Internal Medicine

## 2007-09-24 ENCOUNTER — Encounter (INDEPENDENT_AMBULATORY_CARE_PROVIDER_SITE_OTHER): Payer: Self-pay | Admitting: Family Medicine

## 2007-09-24 LAB — CONVERTED CEMR LAB
BUN: 13 mg/dL (ref 6–23)
CO2: 26 meq/L (ref 19–32)
Calcium: 9.5 mg/dL (ref 8.4–10.5)
Chloride: 102 meq/L (ref 96–112)
Creatinine, Ser: 0.86 mg/dL (ref 0.40–1.20)
Glucose, Bld: 110 mg/dL — ABNORMAL HIGH (ref 70–99)
Potassium: 3.5 meq/L (ref 3.5–5.3)
Sodium: 140 meq/L (ref 135–145)
Vit D, 1,25-Dihydroxy: 14 — ABNORMAL LOW (ref 30–89)

## 2007-10-15 ENCOUNTER — Ambulatory Visit: Payer: Self-pay | Admitting: Internal Medicine

## 2007-10-16 ENCOUNTER — Emergency Department (HOSPITAL_COMMUNITY): Admission: EM | Admit: 2007-10-16 | Discharge: 2007-10-16 | Payer: Self-pay | Admitting: Emergency Medicine

## 2007-11-07 ENCOUNTER — Ambulatory Visit: Payer: Self-pay | Admitting: Internal Medicine

## 2007-11-09 IMAGING — CR DG SI JOINTS 3+V
3 series · 3 of 3 positions shown · non-contrast
Comparison: none

CLINICAL DATA: Pain.
 SACROILIAC JOINTS ? 3 VIEWS:

[view not recorded (1 of 3)]
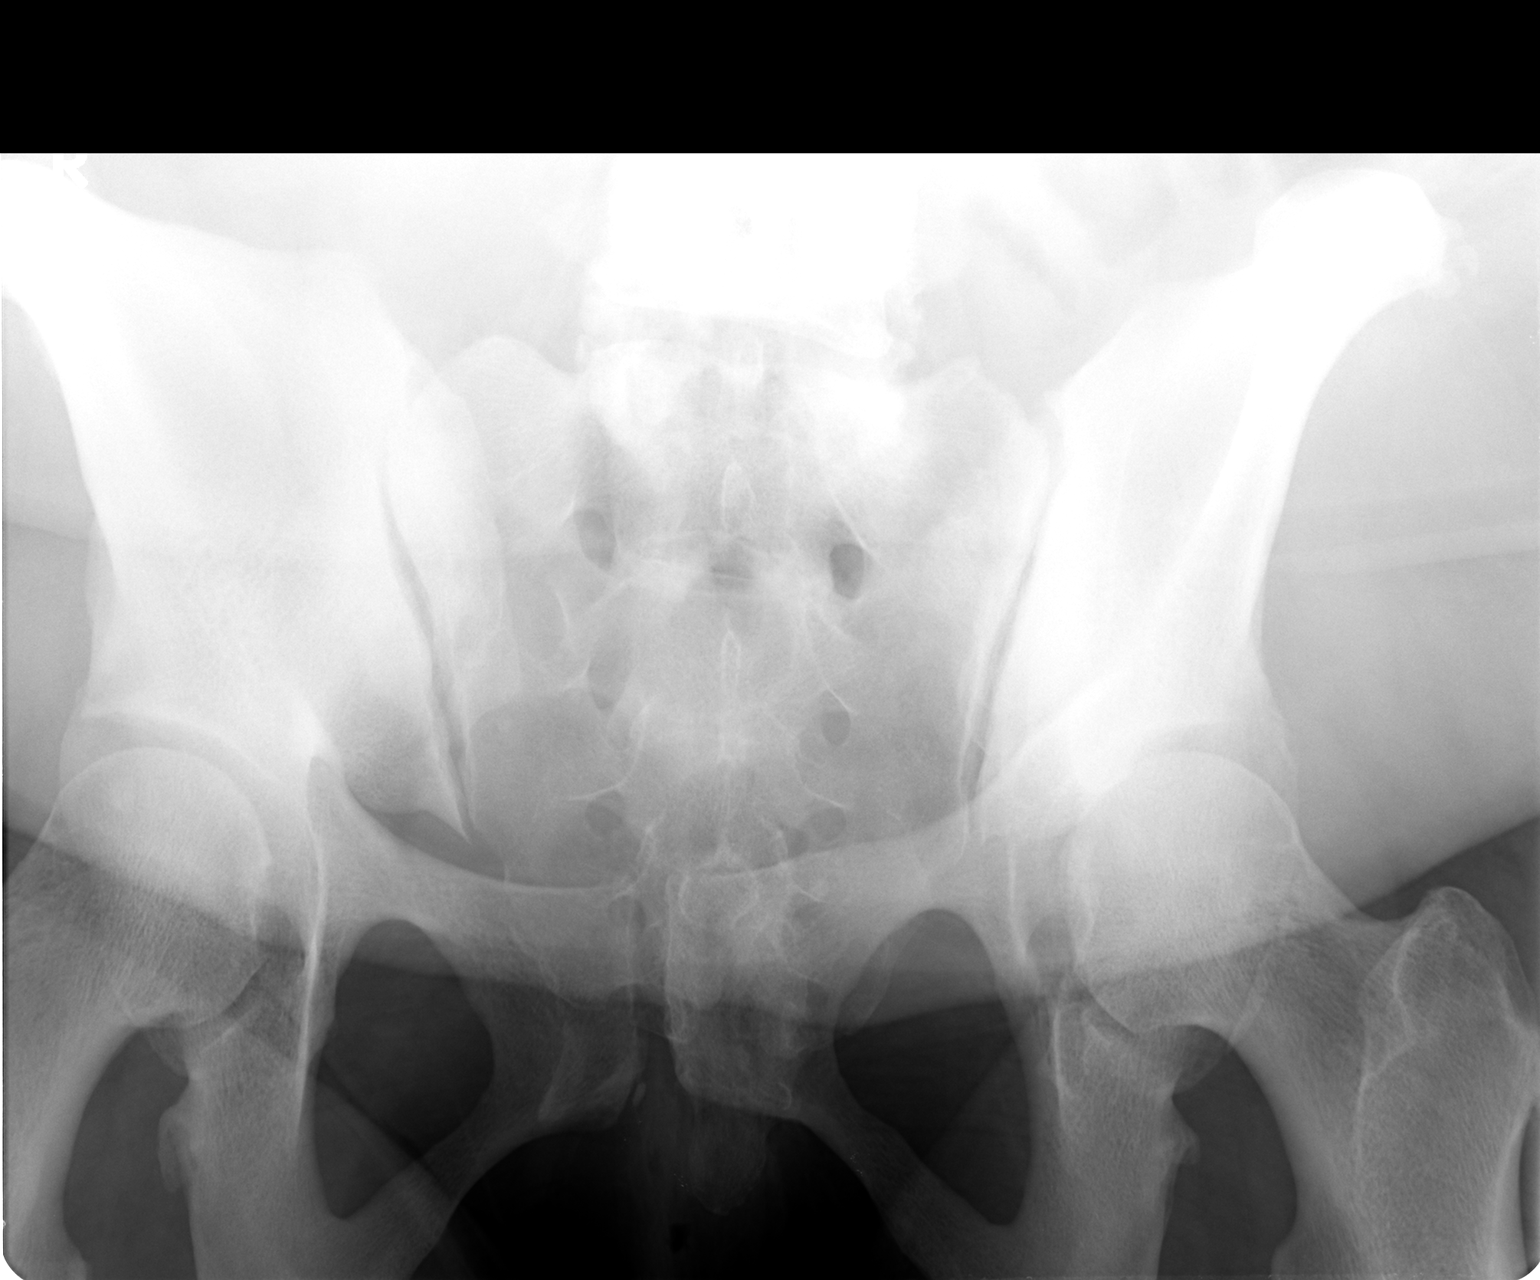

[view not recorded (2 of 3)]
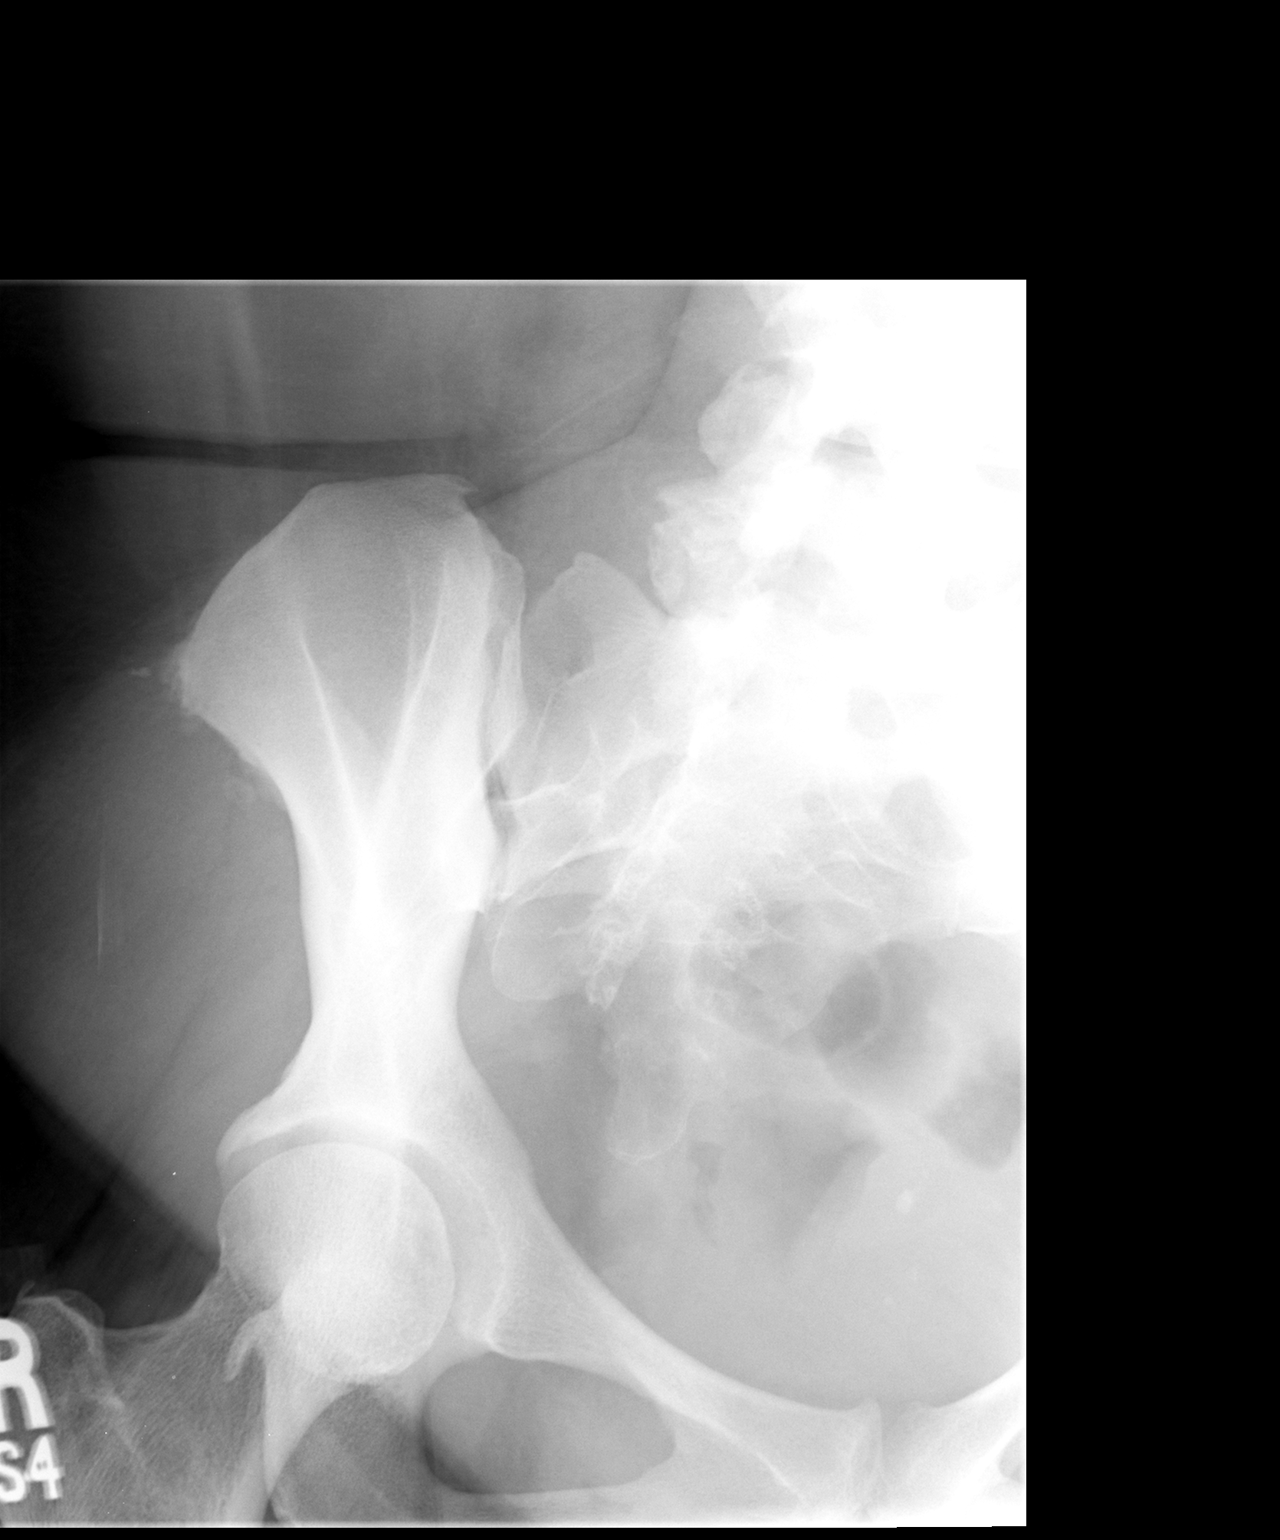

[view not recorded (3 of 3)]
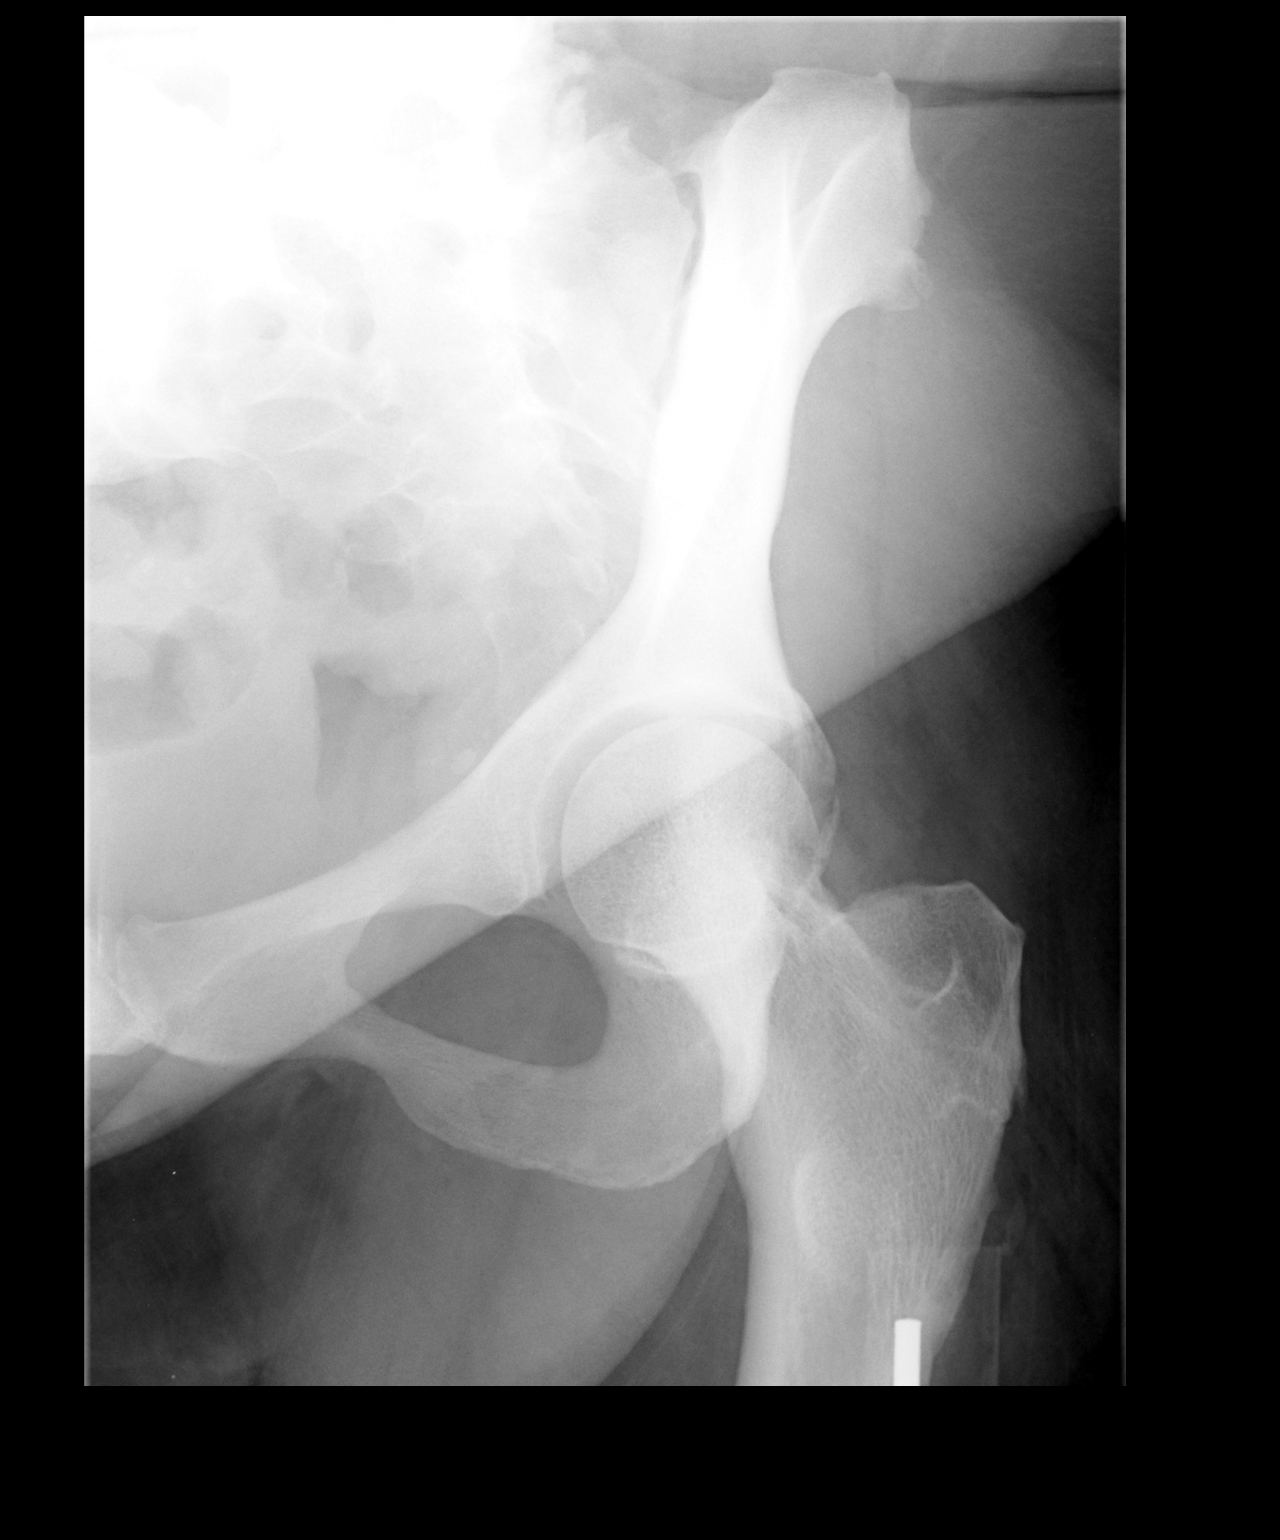

[3 of 3 positions shown; findings below may reference images not displayed]

FINDINGS: The sacroiliac joints appear normal bilaterally without evidence of inflammatory or degenerative disease. The pelvis is also unremarkable.
IMPRESSION: Negative plain films of the sacroiliac joints.

## 2007-11-09 IMAGING — CR DG LUMBAR SPINE COMPLETE 4+V
5 series · 5 of 5 positions shown · non-contrast
Comparison: 06/28/05.

CLINICAL DATA: Back pain.
 LUMBAR SPINE ? 4 VIEW:

[view not recorded (1 of 5)]
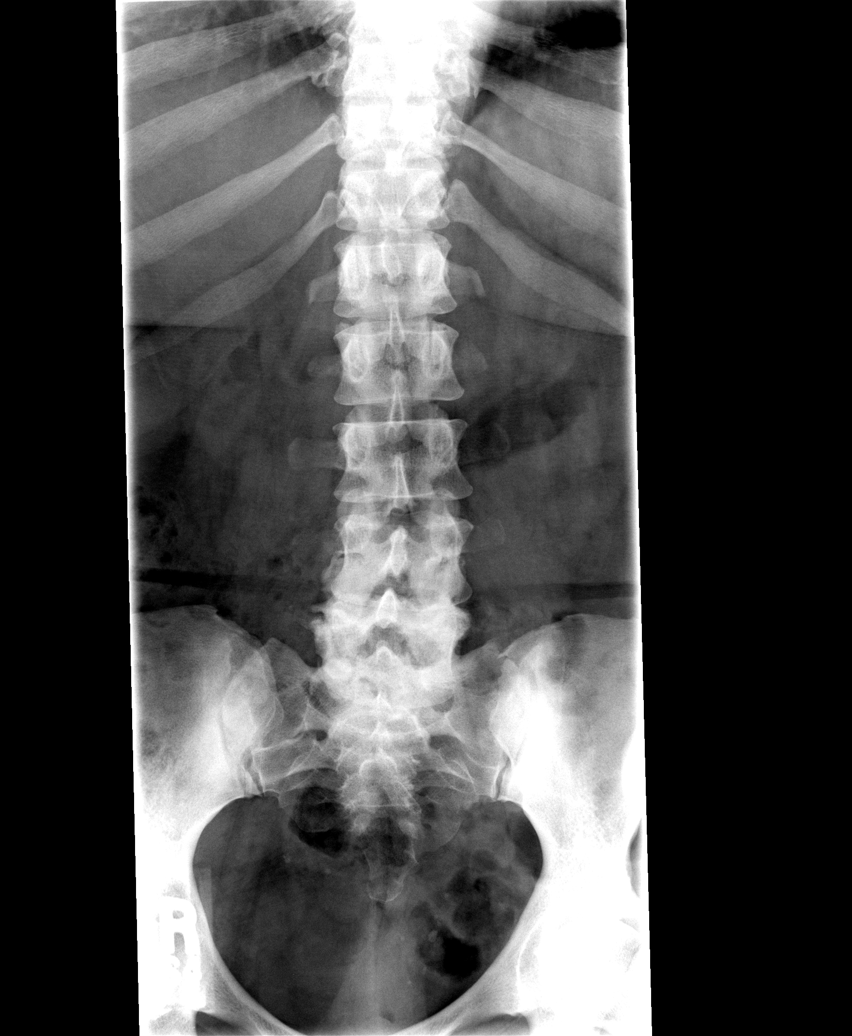

[view not recorded (2 of 5)]
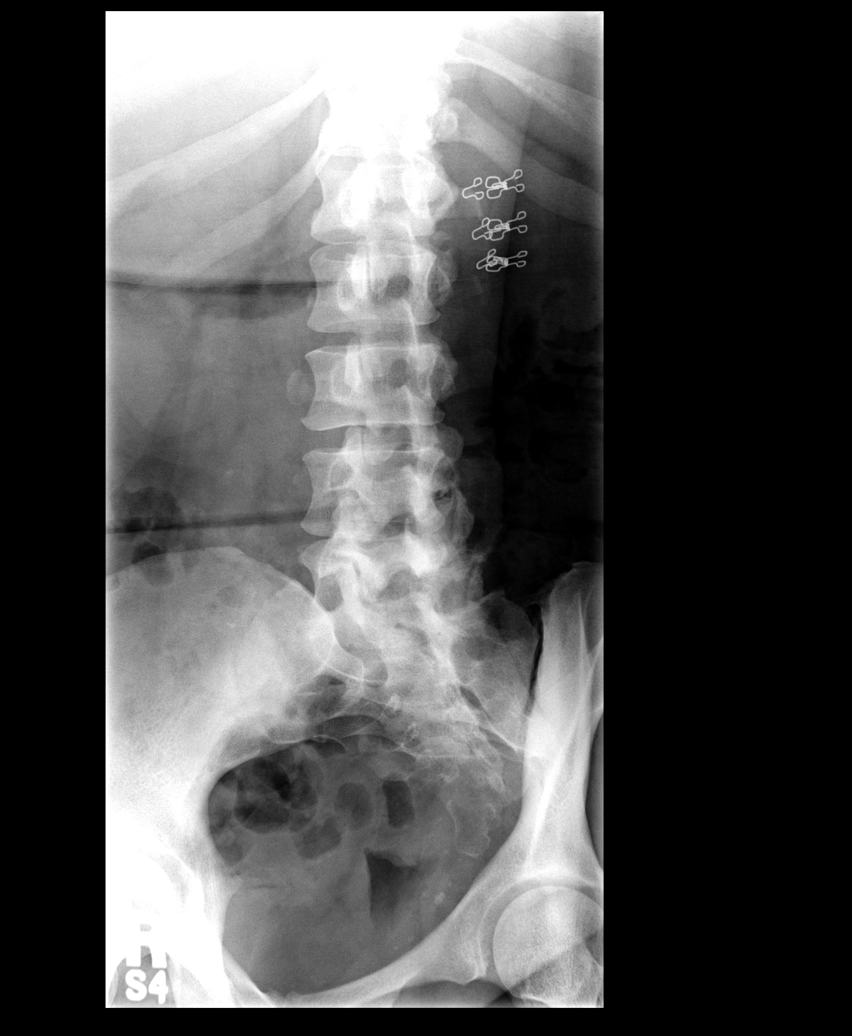

[view not recorded (3 of 5)]
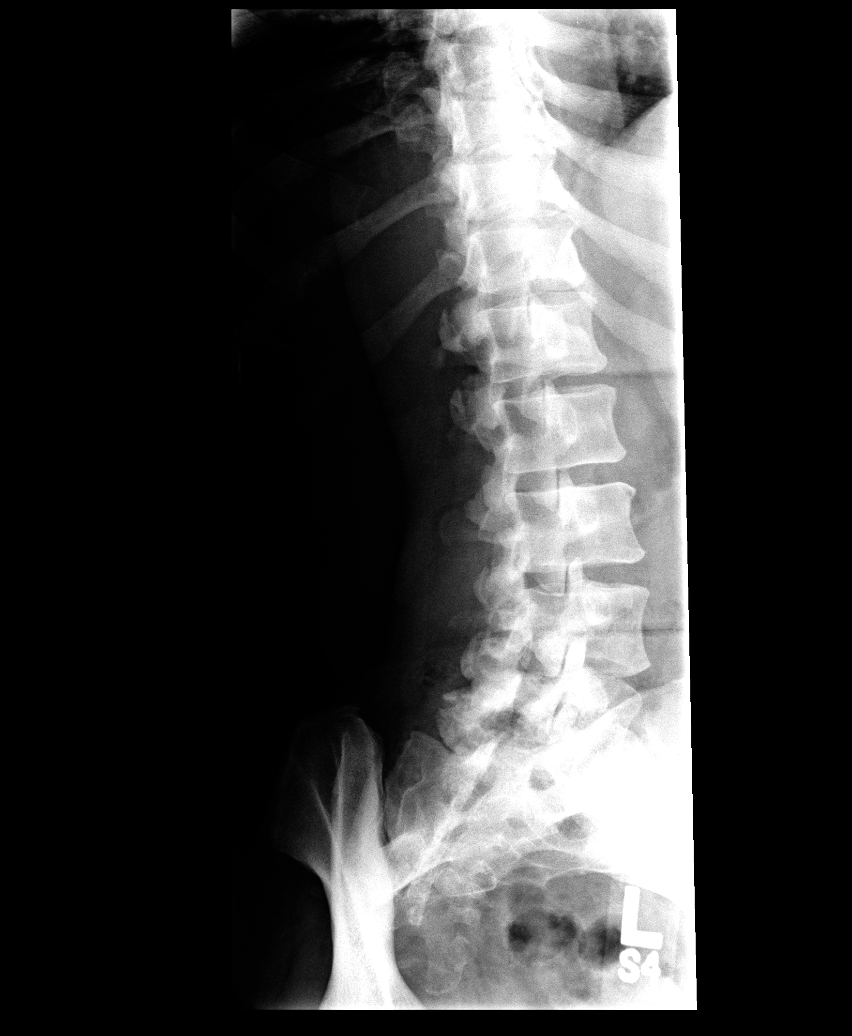

[view not recorded (4 of 5)]
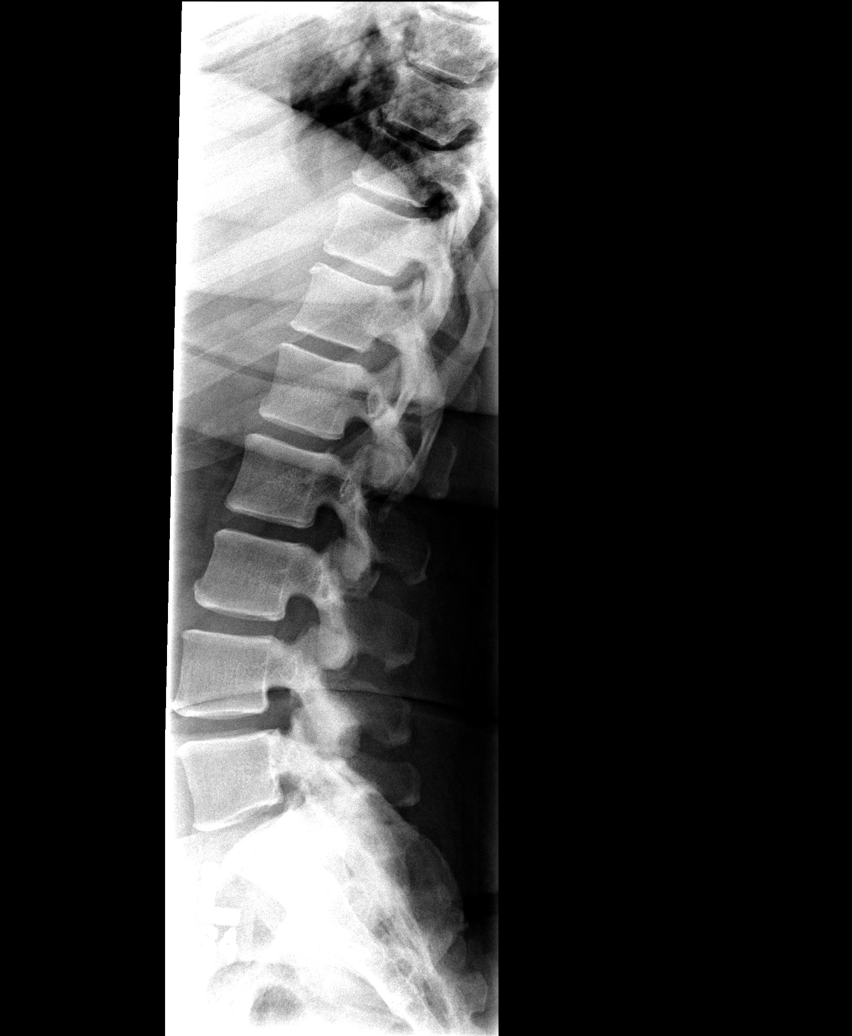

[view not recorded (5 of 5)]
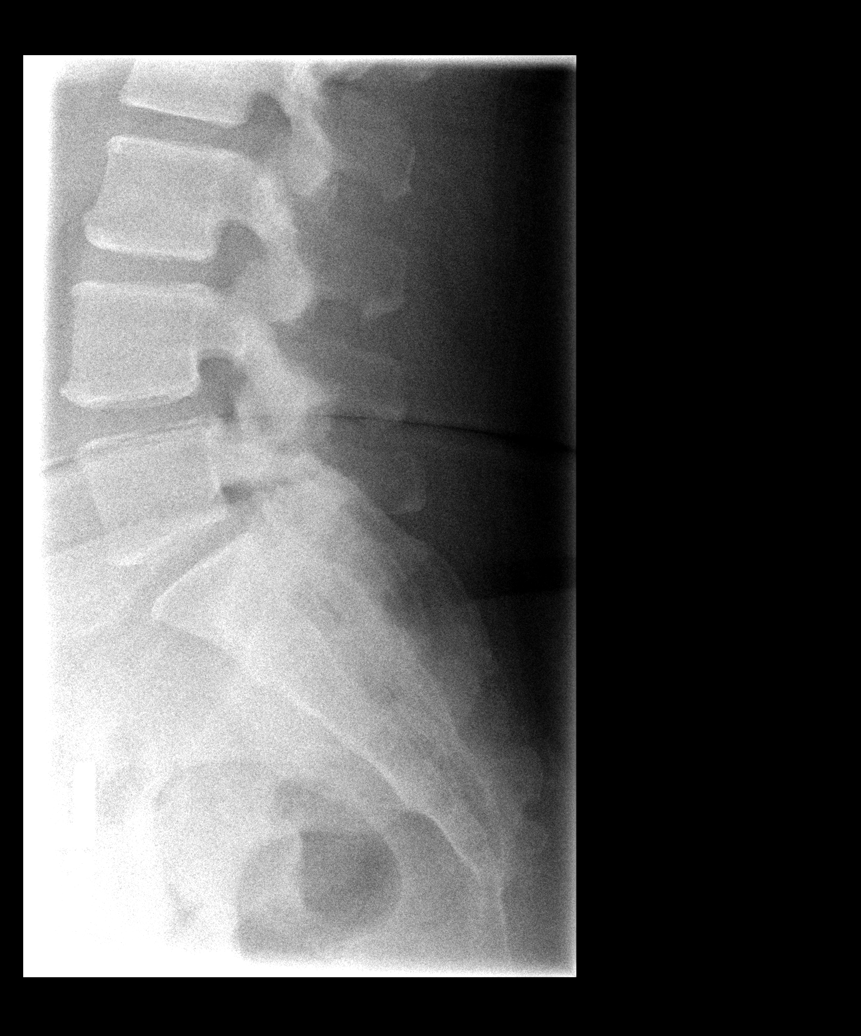

[5 of 5 positions shown; findings below may reference images not displayed]

FINDINGS: Vertebral body height and alignment are maintained.  Facet arthropathy is worst at L4-5 and L5-S1 again noted.  Disc space height appears maintained.
IMPRESSION: No interval change with facet degenerative disease at L4-5 and L5-S1 again seen.

## 2007-11-13 ENCOUNTER — Ambulatory Visit: Payer: Self-pay | Admitting: Thoracic Surgery

## 2007-11-13 ENCOUNTER — Ambulatory Visit (HOSPITAL_COMMUNITY): Admission: RE | Admit: 2007-11-13 | Discharge: 2007-11-13 | Payer: Self-pay | Admitting: Thoracic Surgery

## 2007-11-14 ENCOUNTER — Ambulatory Visit: Payer: Self-pay | Admitting: Internal Medicine

## 2007-12-04 ENCOUNTER — Encounter (INDEPENDENT_AMBULATORY_CARE_PROVIDER_SITE_OTHER): Payer: Self-pay | Admitting: Family Medicine

## 2007-12-04 ENCOUNTER — Ambulatory Visit: Payer: Self-pay | Admitting: Internal Medicine

## 2007-12-04 LAB — CONVERTED CEMR LAB: Vit D, 1,25-Dihydroxy: 26 — ABNORMAL LOW (ref 30–89)

## 2007-12-18 ENCOUNTER — Observation Stay (HOSPITAL_COMMUNITY): Admission: EM | Admit: 2007-12-18 | Discharge: 2007-12-18 | Payer: Self-pay | Admitting: *Deleted

## 2007-12-23 ENCOUNTER — Ambulatory Visit: Payer: Self-pay | Admitting: Internal Medicine

## 2008-01-03 ENCOUNTER — Ambulatory Visit: Payer: Self-pay | Admitting: Family Medicine

## 2008-01-30 ENCOUNTER — Ambulatory Visit: Payer: Self-pay | Admitting: Internal Medicine

## 2008-02-07 ENCOUNTER — Ambulatory Visit: Payer: Self-pay | Admitting: Internal Medicine

## 2008-03-06 IMAGING — CR DG CHEST 1V PORT
1 series · 1 of 1 positions shown · non-contrast
Comparison: 02/12/2005

CLINICAL DATA: Chest pain and shortness of breath.

[view not recorded]
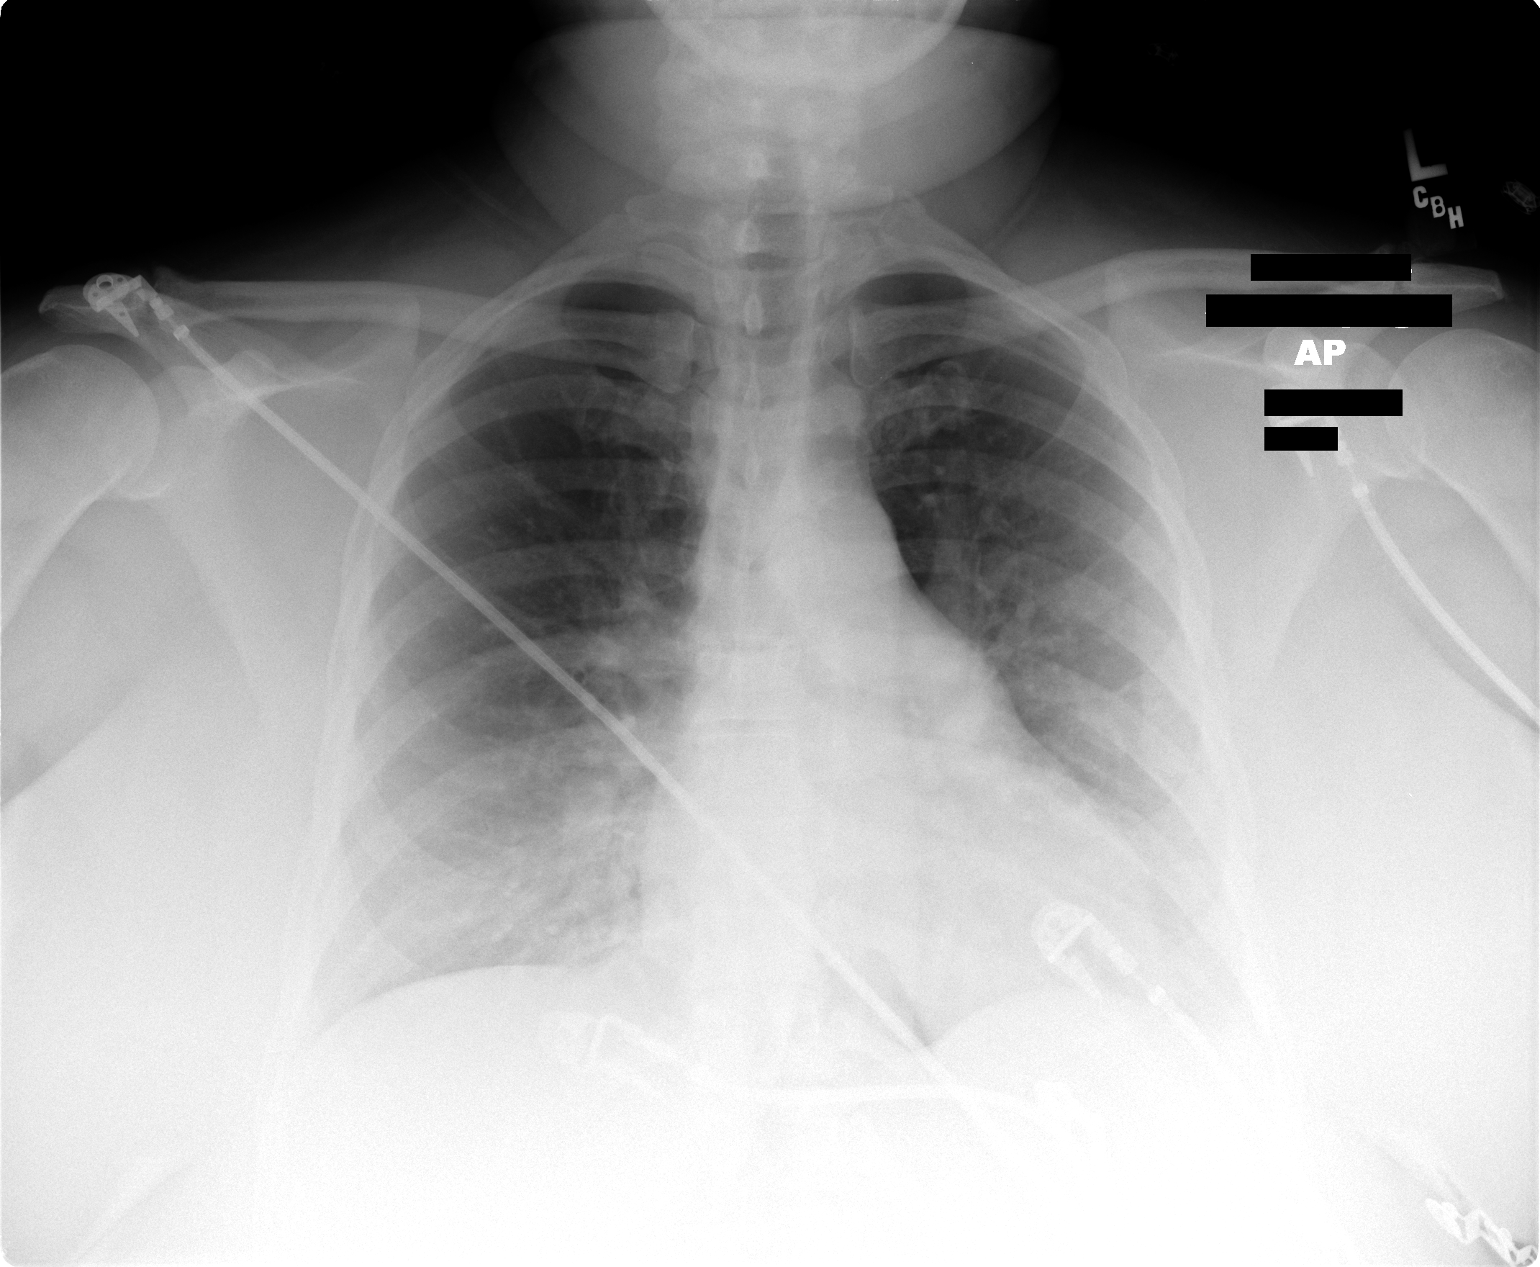

[1 of 1 positions shown; findings below may reference images not displayed]

PORTABLE CHEST - 1 VIEW:

Cardiopericardial silhouette is at upper limits of normal for size. There is
chronic interstitial coarsening. Hazy opacity of the lung bases compatible with
superimposed soft tissue. The anterior mediastinal mass seen on a previous CT
chest from 02/22/2005 is not apparent on this portable chest x-ray. Prominence of
the hila is unchanged. Telemetry leads overlie the chest.
IMPRESSION: Stable exam. There is chronic interstitial coarsening. No new or acute findings.

## 2008-03-07 IMAGING — CT CT ANGIO CHEST
3 of 4 series · 19 of 31 positions shown · IV contrast ([ID] OMNI 350)
Comparison: 02/22/2005

CLINICAL DATA: Chest pain and shortness of breath.
TECHNIQUE: Multidetector CT imaging of the chest was performed during bolus
injection of intravenous contrast.  Multiplanar CT angiographic image
reconstructions were generated to evaluate the vascular anatomy.

[Series 2: pe · axial · 0.70mm/px · z∈[-314,-106]mm · 10 of 214 slices shown]
[im 24/214  lung]
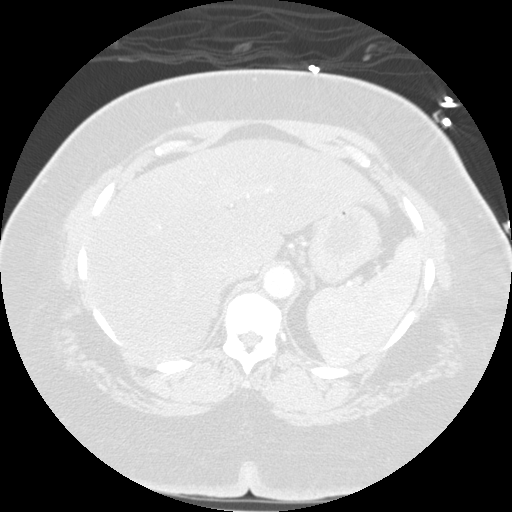
[im 48/214  mediastinal]
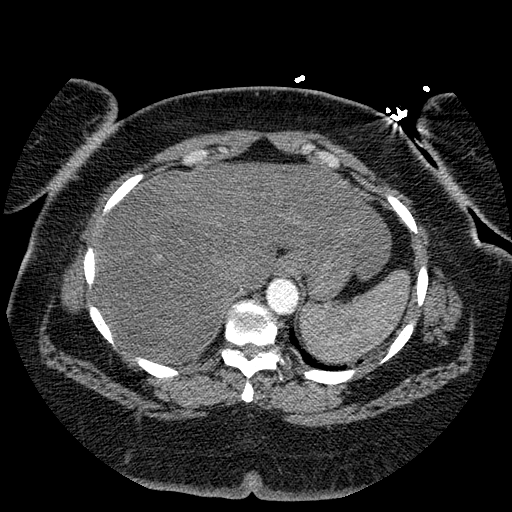
[im 72/214  lung]
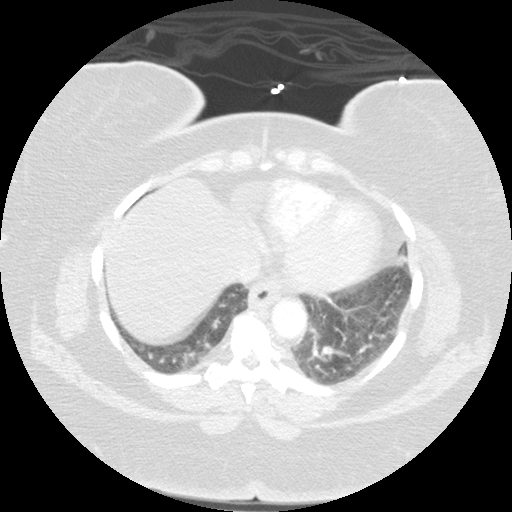
[im 95/214  mediastinal]
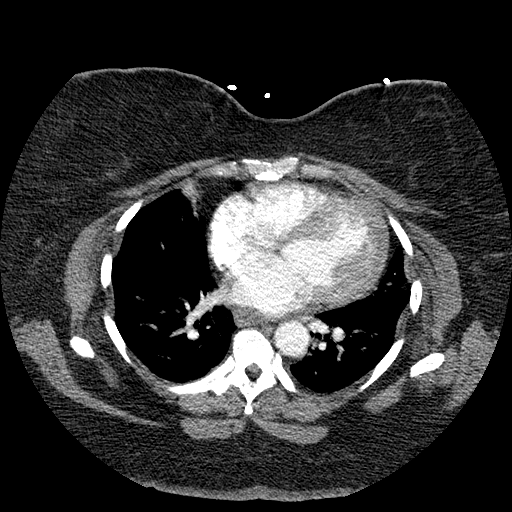
[im 107/214  lung]
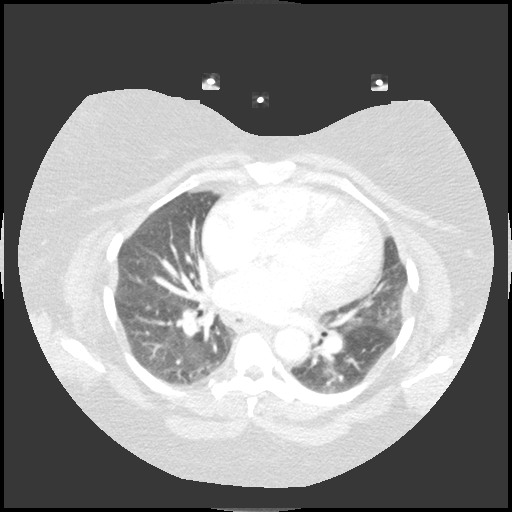
[im 118/214  mediastinal]
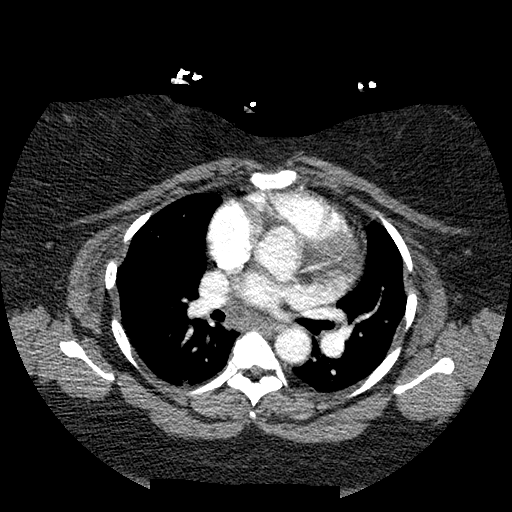
[im 119/214  lung]
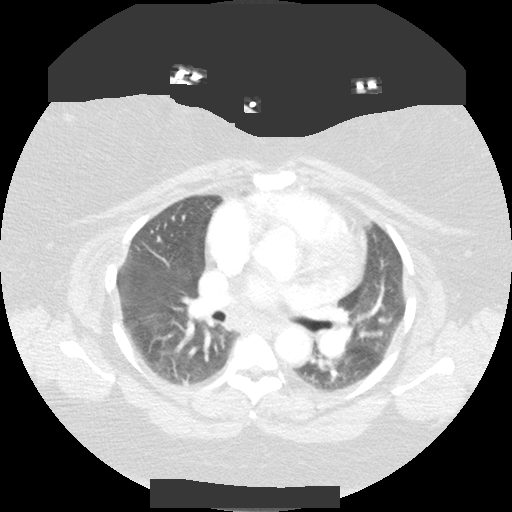
[im 143/214  mediastinal]
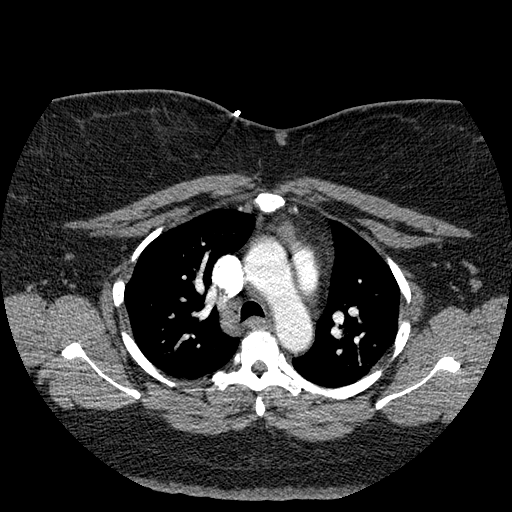
[im 166/214  lung]
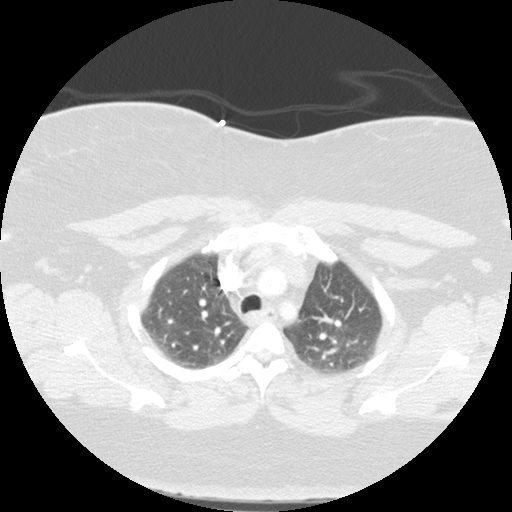
[im 190/214  mediastinal]
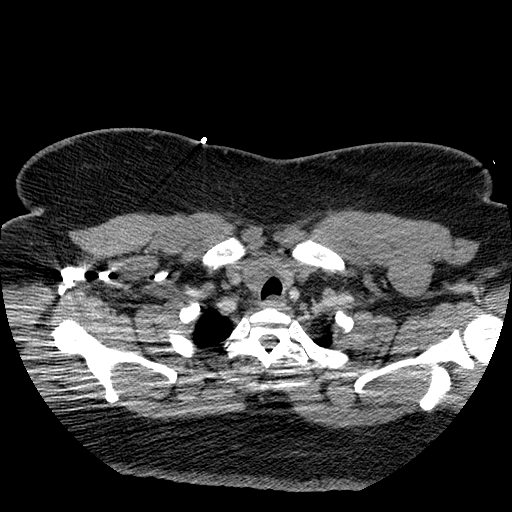

[Series 3: recon 2: pe · axial · 0.70mm/px · z∈[-276,-144]mm · 4 of 107 slices shown]
[im 27/107  lung]
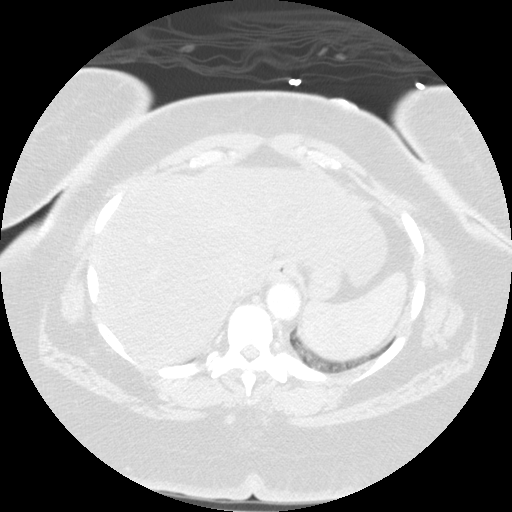
[im 54/107  lung]
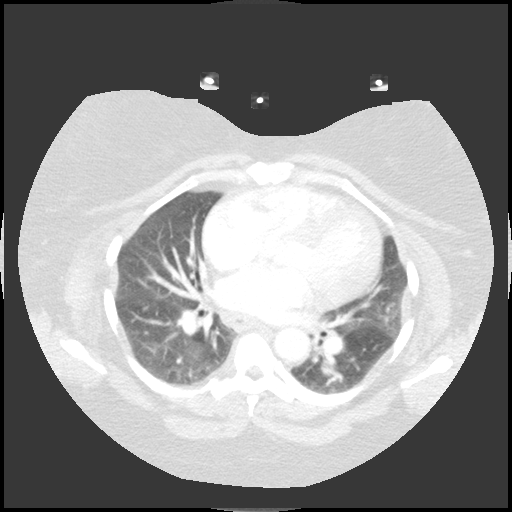
[im 59/107  lung]
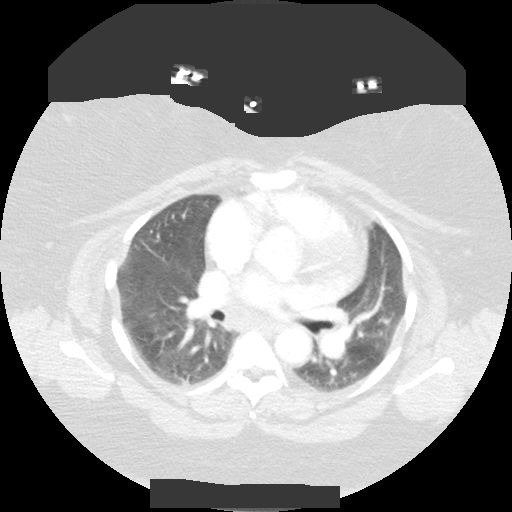
[im 80/107  lung]
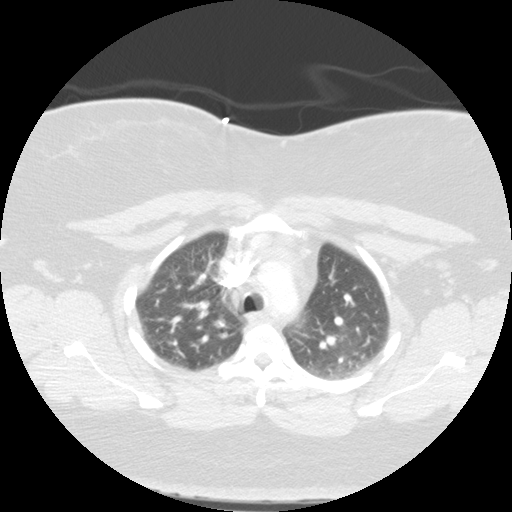

[Series 201: reformatted · sagittal · 0.70mm/px · 5 of 140 slices shown]
[im 24/140  lung]
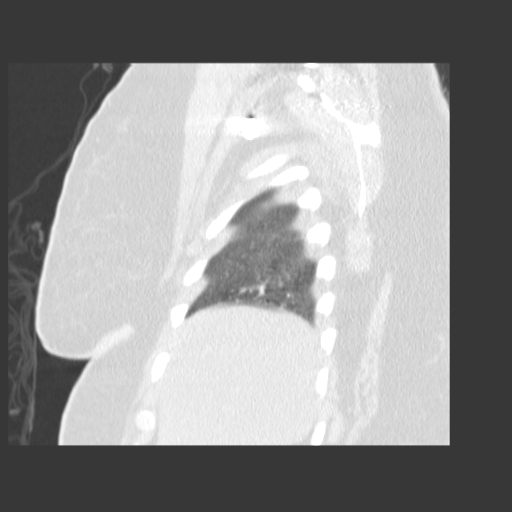
[im 47/140  lung]
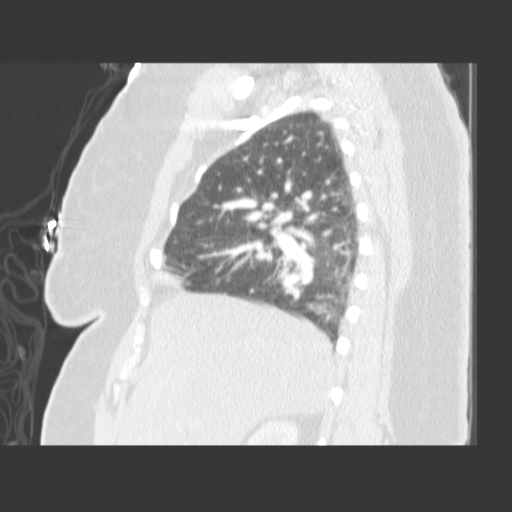
[im 70/140  lung]
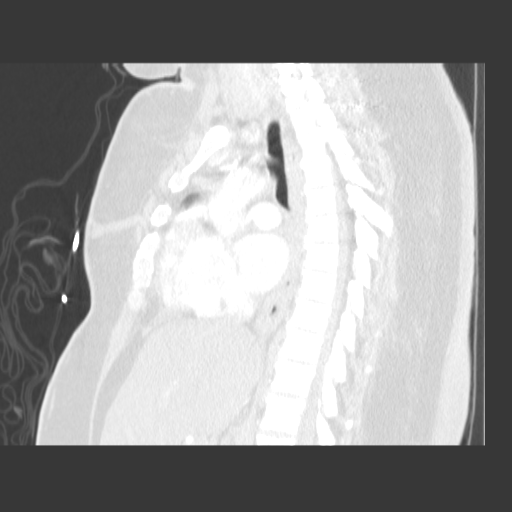
[im 93/140  lung]
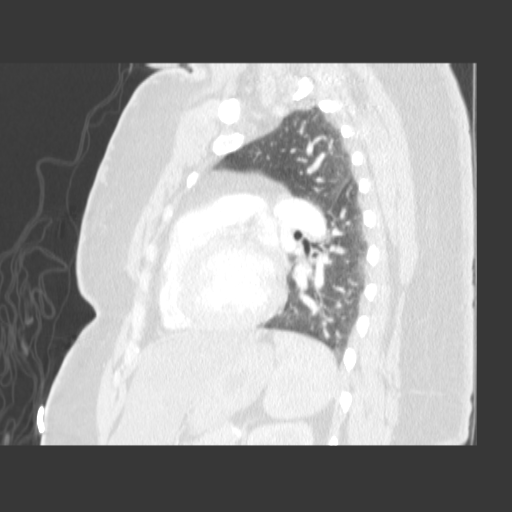
[im 116/140  lung]
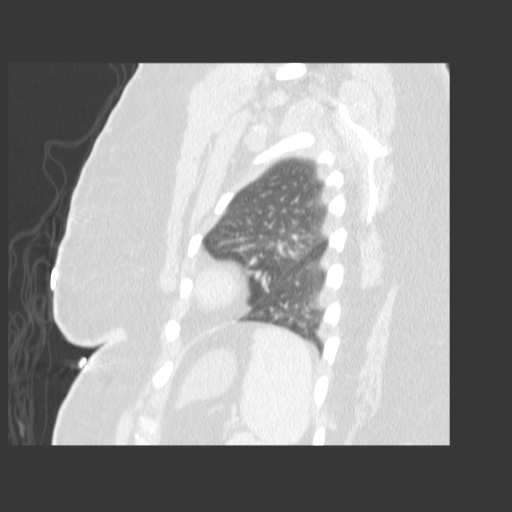

[19 of 31 positions shown; findings below may reference images not displayed]

Contrast:  100 cc Omnipaque 300

CT ANGIOGRAPHY OF CHEST:

Image quality is degraded by the patient's very large body habitus and her
inability to reproducibly hold her breath during image acquisition. This results
in vascular motion and crowding. There is no large central pulmonary embolus. No
evidence for embolic disease within the lobar pulmonary arteries. There is
motion artifact in the upper lobes and apparent tiny filling defects in
subsegmental pulmonary arteries to the right upper lobe (see images 57 and 59).

No evidence for axillary adenopathy. Anterior mediastinal mass is stable at
cm. Heart is enlarged. There is no pericardial or pleural effusion.

Evaluation of lung parenchyma is degraded by respiratory motion. There is no
dense airspace consolidation. compressive atelectasis is noted bilaterally.
IMPRESSION: No large central pulmonary embolus. Tiny filling defects are suggested in
subsegmental pulmonary arteries to the right upper lobe raising the question of
tiny pulmonary emboli,  but these may be artifactual, and are not a definite
finding.  Clinical correlation will be required.

Stable anterior mediastinal mass.

## 2008-03-12 ENCOUNTER — Ambulatory Visit: Payer: Self-pay | Admitting: Internal Medicine

## 2008-03-28 IMAGING — CR DG WRIST COMPLETE 3+V*L*
4 series · 4 of 4 positions shown · non-contrast
Comparison: None.

CLINICAL DATA: Chronic wrist and thumb pain.  No trauma.
 LEFT WRIST ? 4 VIEWS:

[x wrist pa left]
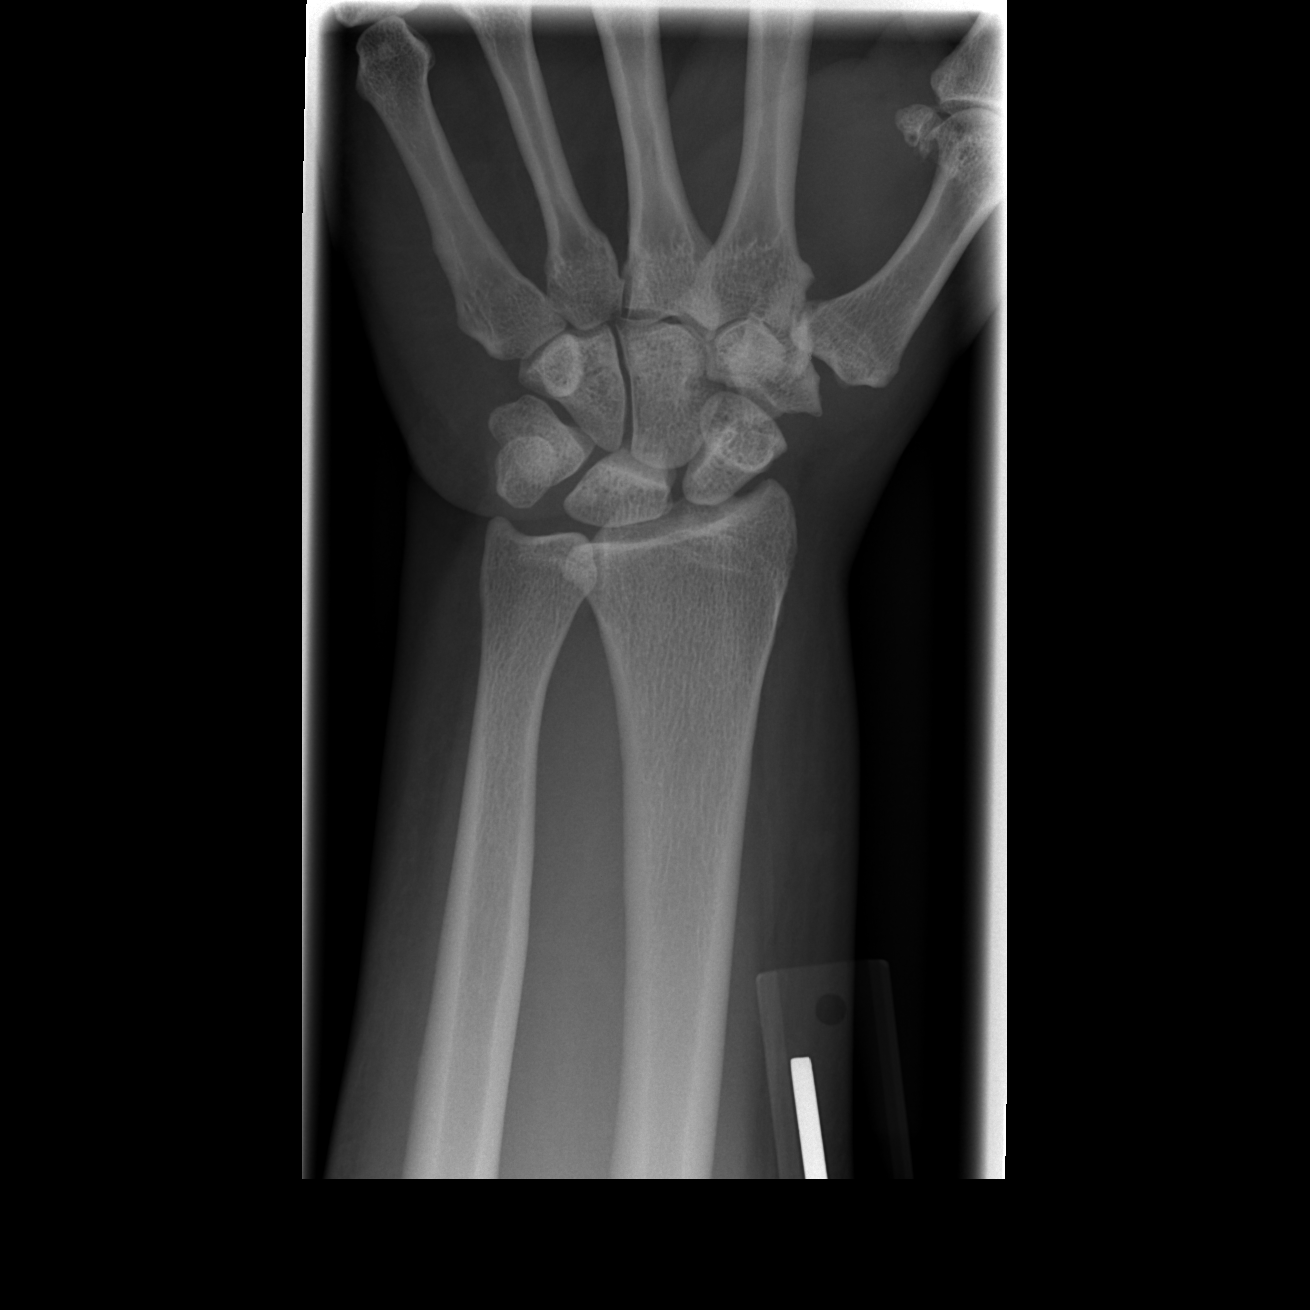

[x wrist obl left]
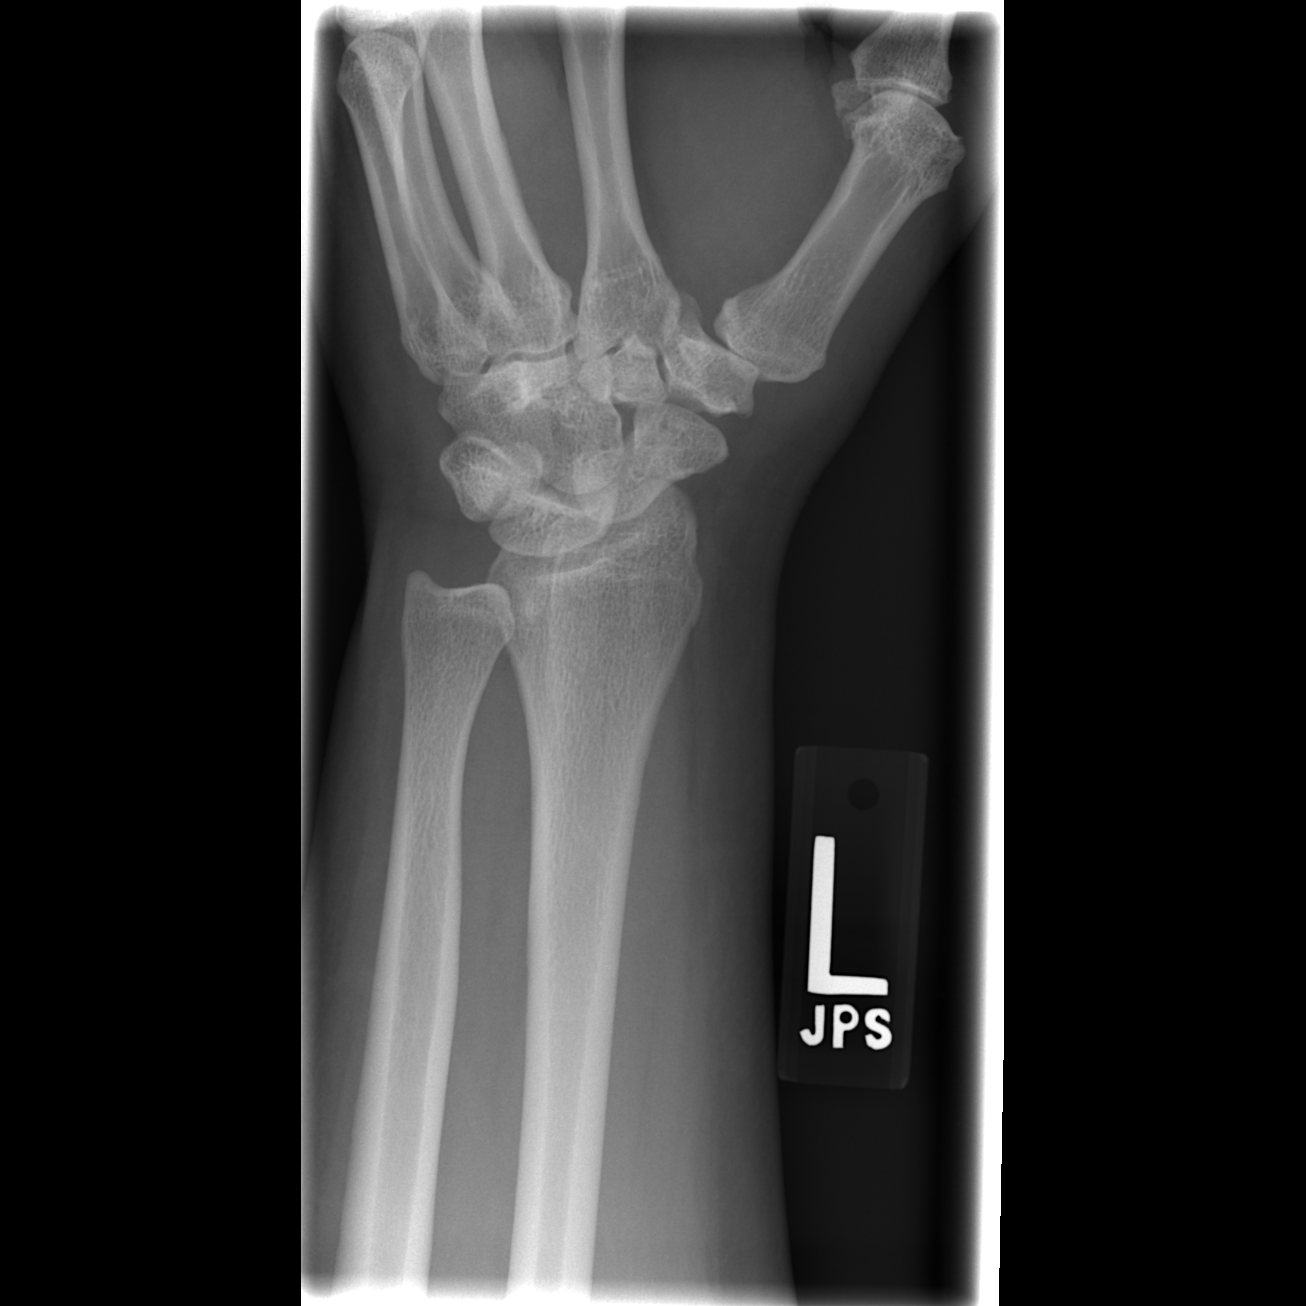

[x wrist lat left]
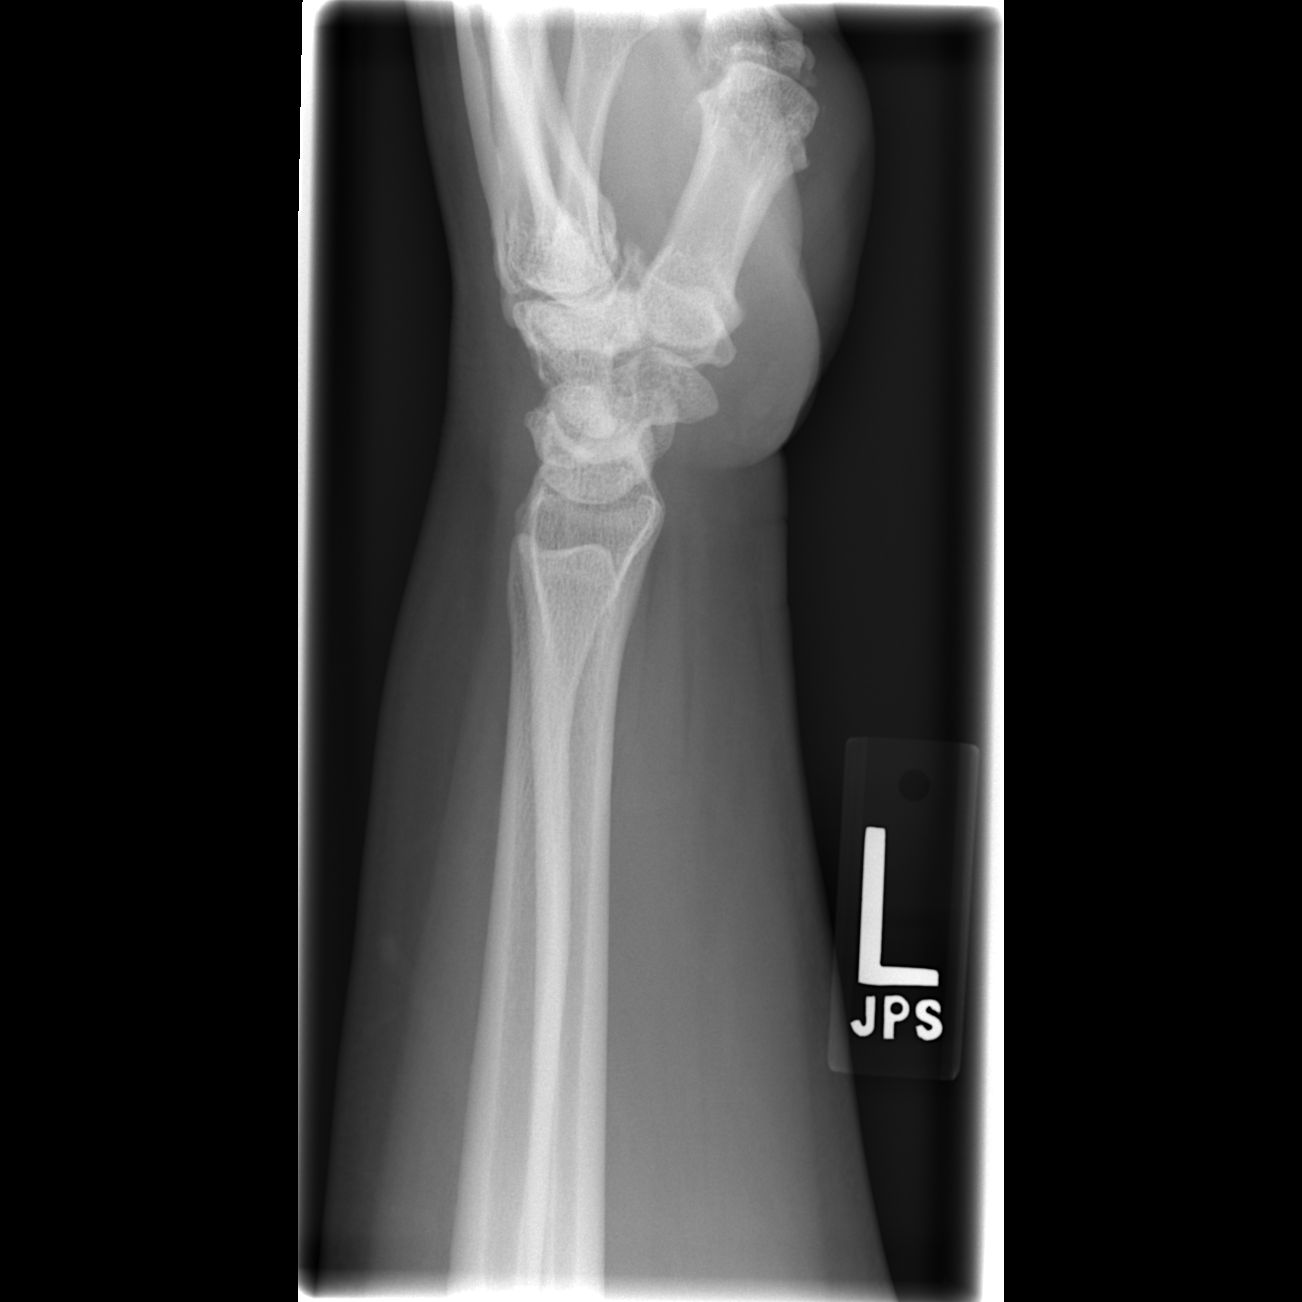

[x navicular]
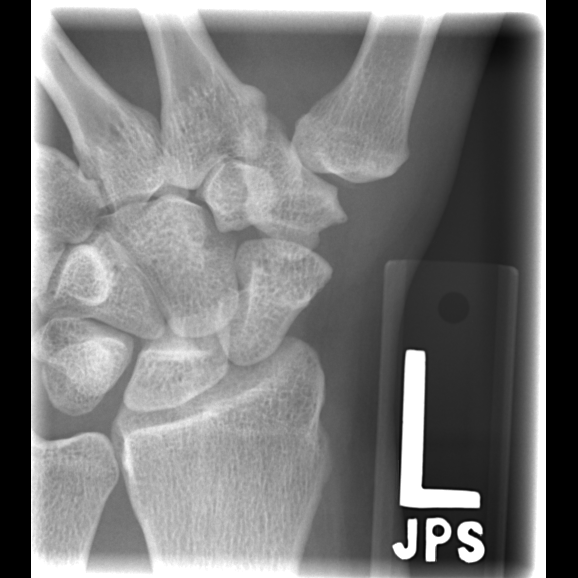

[4 of 4 positions shown; findings below may reference images not displayed]

FINDINGS: No fracture or dislocation.  No significant soft tissue swelling.
IMPRESSION: No acute finding.

## 2008-03-29 IMAGING — CT CT ANGIO CHEST
3 of 4 series · 19 of 36 positions shown · IV contrast (APPLIED)
Comparison: none

CLINICAL DATA: Shortness of breath, chest pain.
CT ANGIOGRAPHY OF CHEST:
TECHNIQUE: Multidetector CT imaging of the chest was performed during bolus injection of intravenous contrast.  Multiplanar CT angiographic image reconstructions were generated to evaluate the vascular anatomy.
Contrast:  100 cc Omnipaque 300.

[Series 8: pulm embolism 2.0 b31f st · axial · 0.54mm/px · z∈[-323,-101]mm · 13 of 131 slices shown]
[im 10/131  lung]
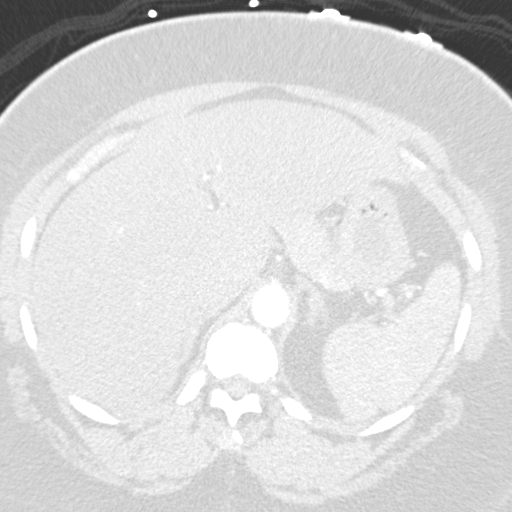
[im 19/131  mediastinal]
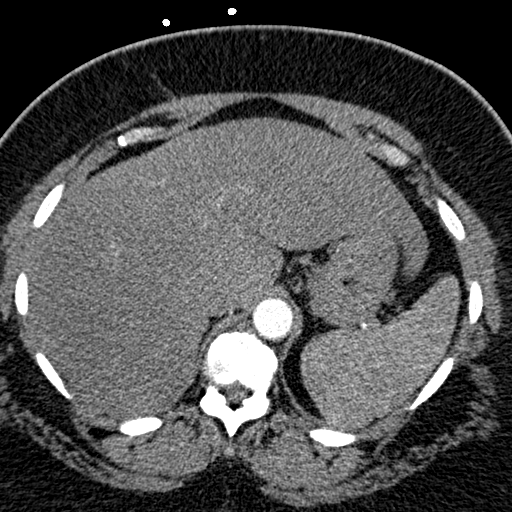
[im 28/131  lung]
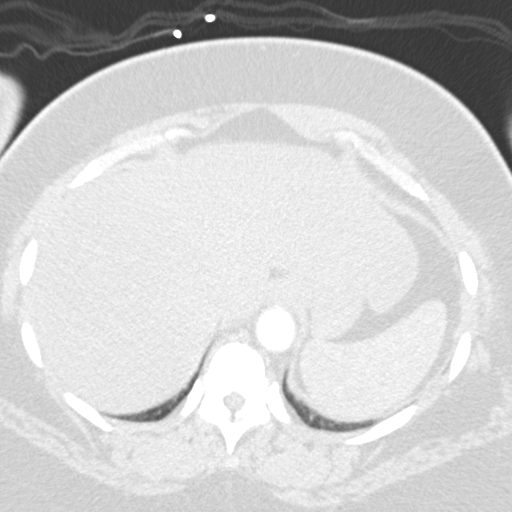
[im 38/131  mediastinal]
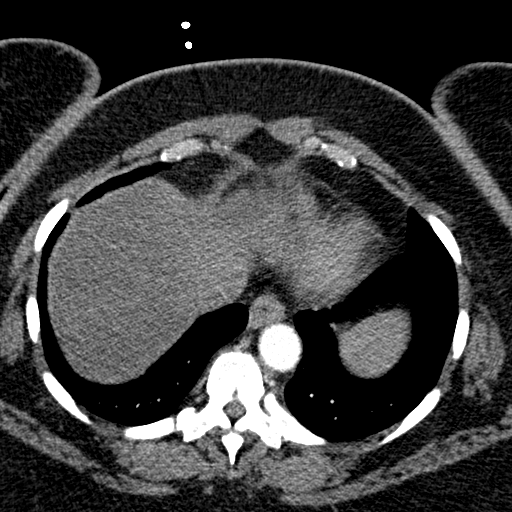
[im 47/131  lung]
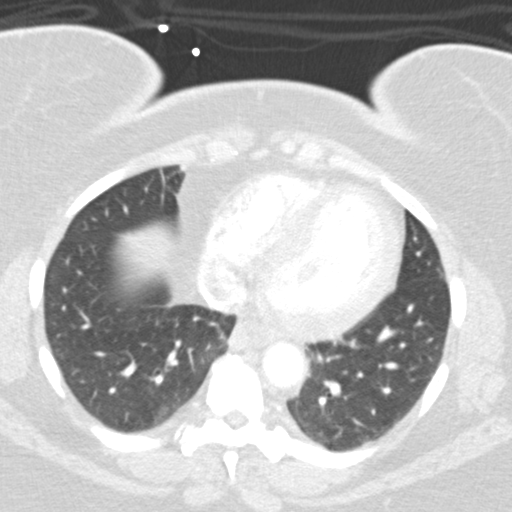
[im 56/131  mediastinal]
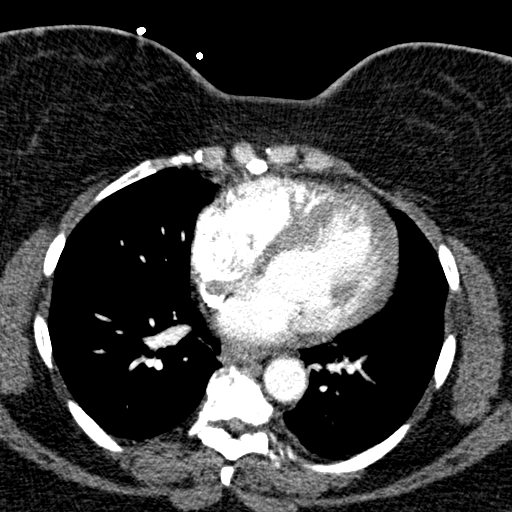
[im 66/131  lung]
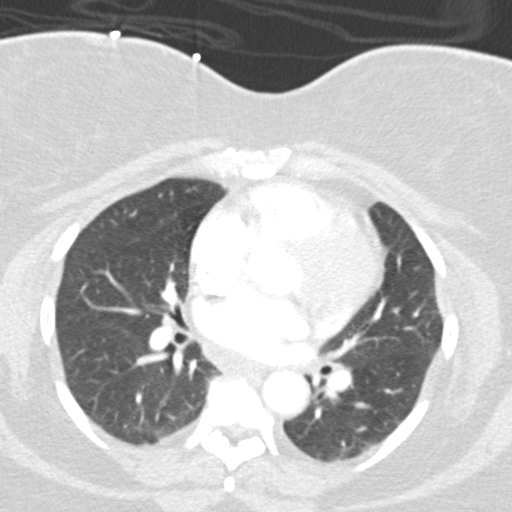
[im 75/131  mediastinal]
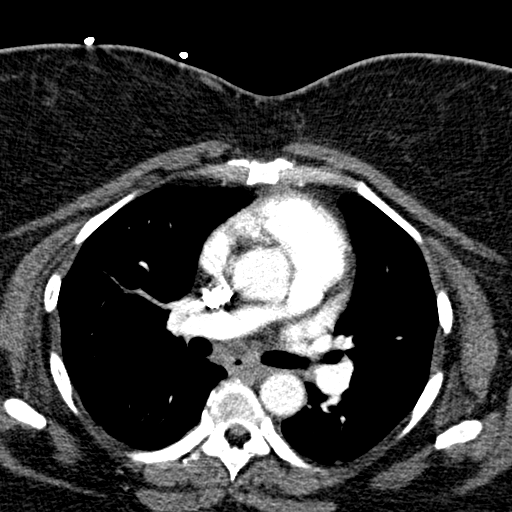
[im 84/131  lung]
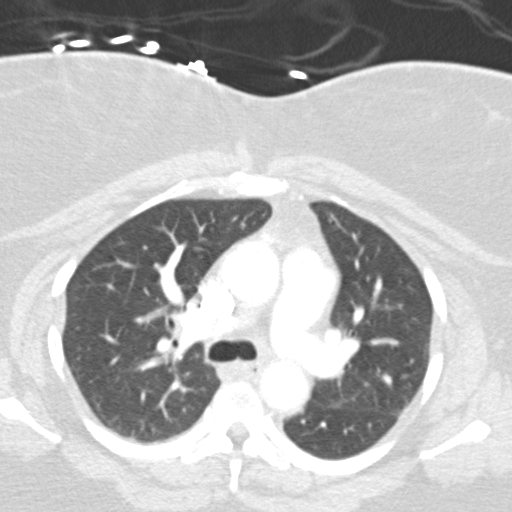
[im 93/131  mediastinal]
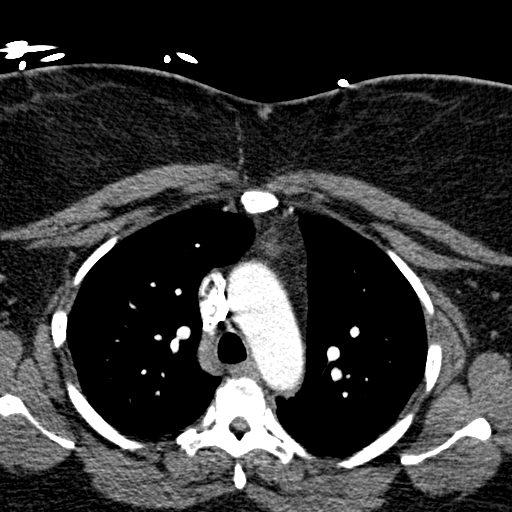
[im 103/131  lung]
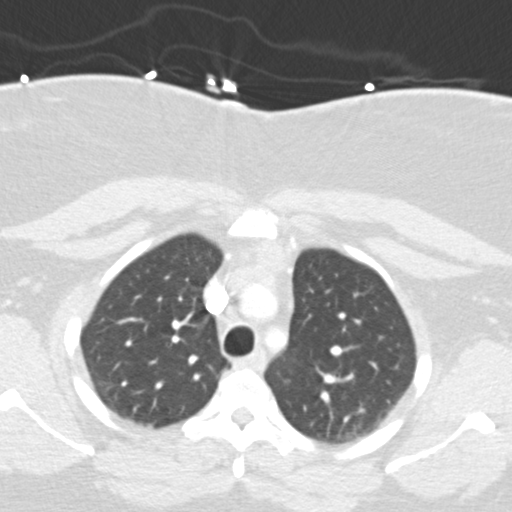
[im 112/131  mediastinal]
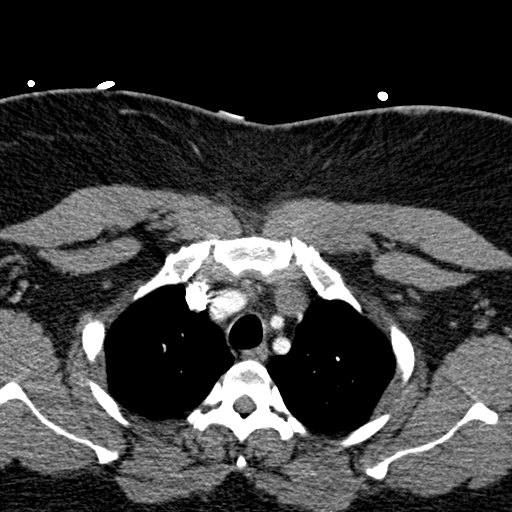
[im 121/131  lung]
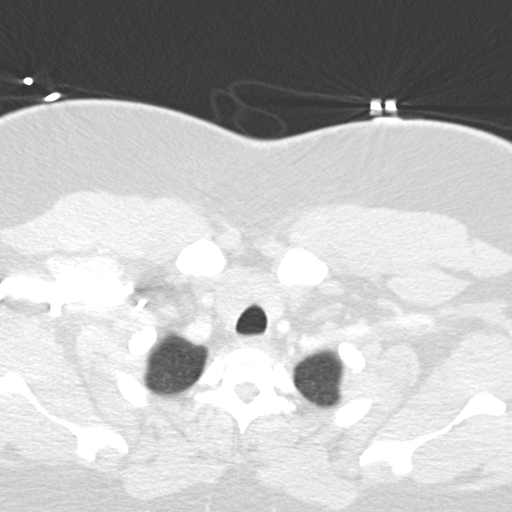

[Series 9: pulm embolism 2.0 b60f lung · axial · 0.54mm/px · z∈[-293,-235]mm · 3 of 116 slices shown]
[im 10/116  mediastinal]
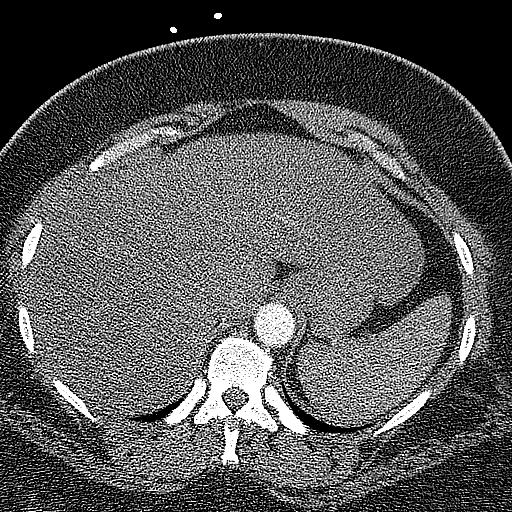
[im 29/116  mediastinal]
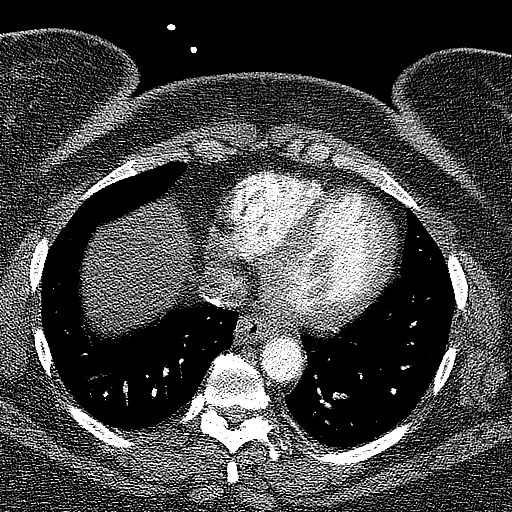
[im 39/116  mediastinal]
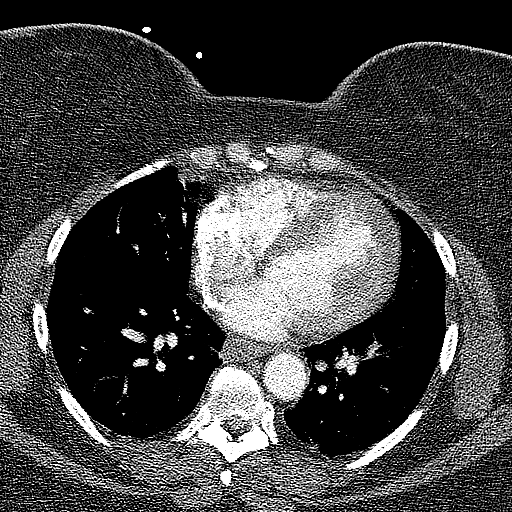

[Series 603: cor · coronal · 0.54mm/px · 3 of 107 slices shown]
[im 22/107  mediastinal]
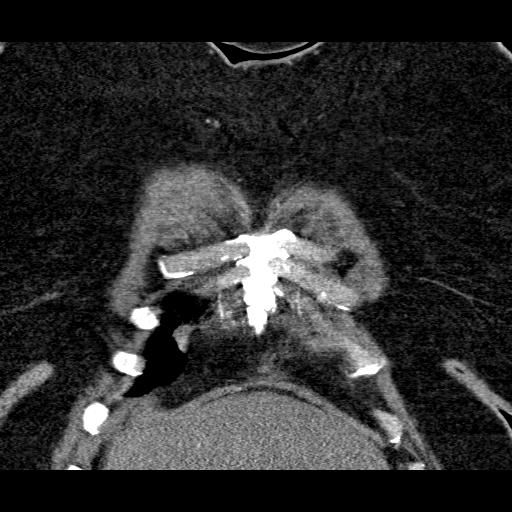
[im 43/107  mediastinal]
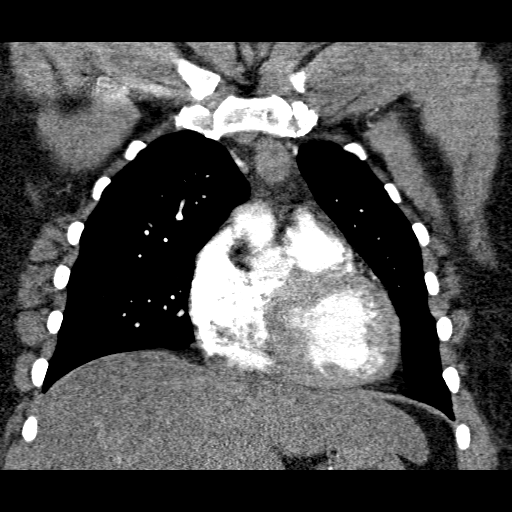
[im 64/107  mediastinal]
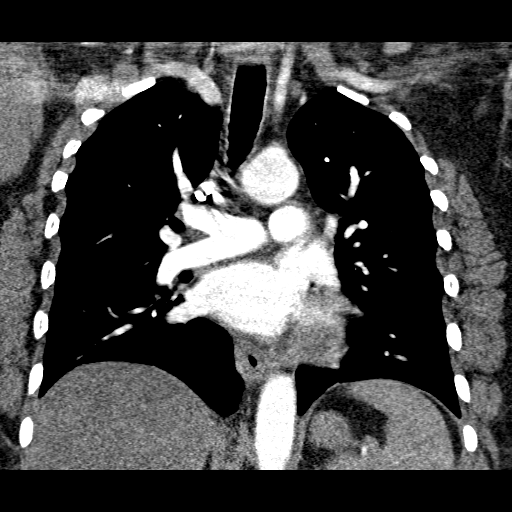

[19 of 36 positions shown; findings below may reference images not displayed]

FINDINGS: Negative for PE. Mild soft tissue fulness in the azygoesophageal recess may reflect mild adenopathy. The main pulmonary artery segment has a transverse dimension of 3.1 cm.  Thoracic aorta unremarkable.   Minimal atelectatic changes in the posterior aspects of the mid to lower lung zones and in the lingular segment plus the medial aspect of the right middle lobe. There is a soft tissue mass in the anterior-superior mediastinum, which measures approximately 2.4 x 2.2 x 2.9 cm.  The mass is noted just ventral to the ascending aorta. Major differential considerations are lymphoma, thymoma, and teratoma.
IMPRESSION: Negative for PE.  Prominent caliber of main and central pulmonary arteries raises the question of pulmonary arterial hypertension. Mild subcarinal/azygoesophageal recess adenopathy.  Mass in the superior aspect of the anterior mediastinum.  Major differential considerations are lymphoma, thymoma, and teratoma.

## 2008-03-29 IMAGING — CR DG CHEST 2V
2 series · 2 of 2 positions shown · non-contrast
Comparison: 05/02/06.

CLINICAL DATA: Short of breath.  History of pulmonary embolism.
 CHEST - 2 VIEW:

[view not recorded (1 of 2)]
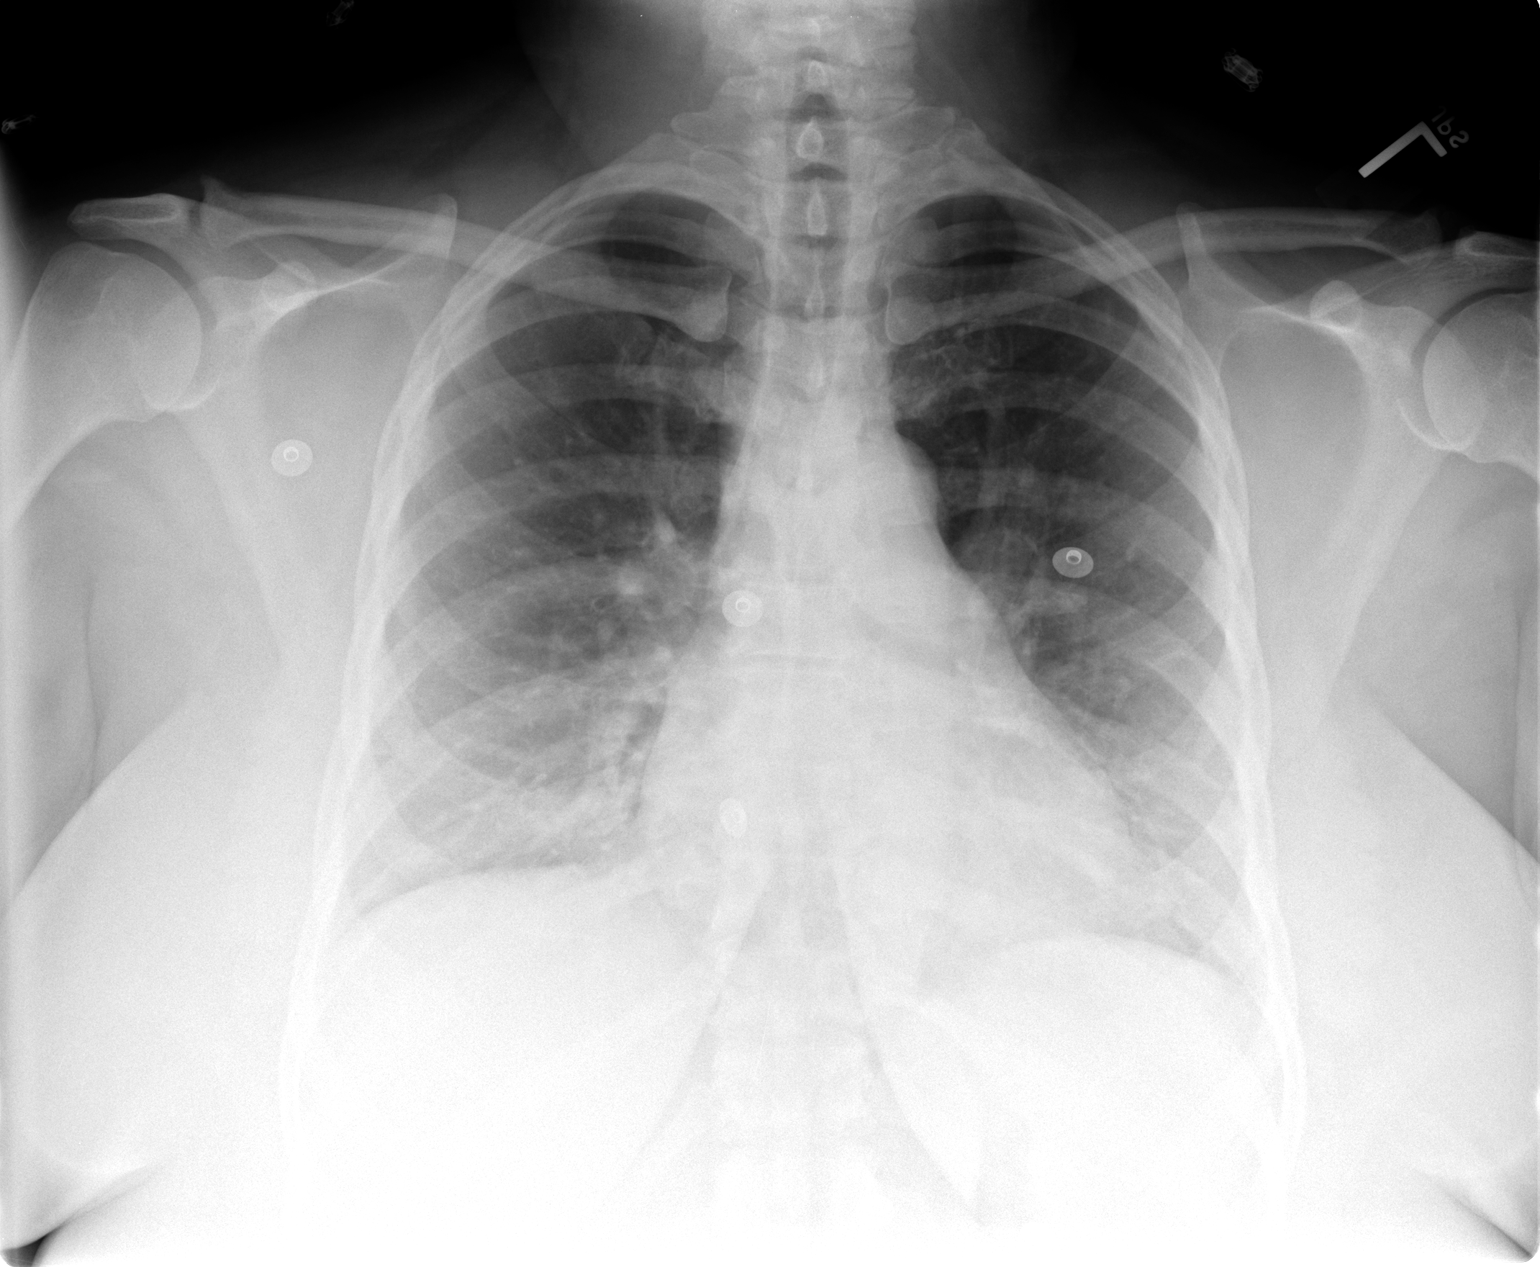

[view not recorded (2 of 2)]
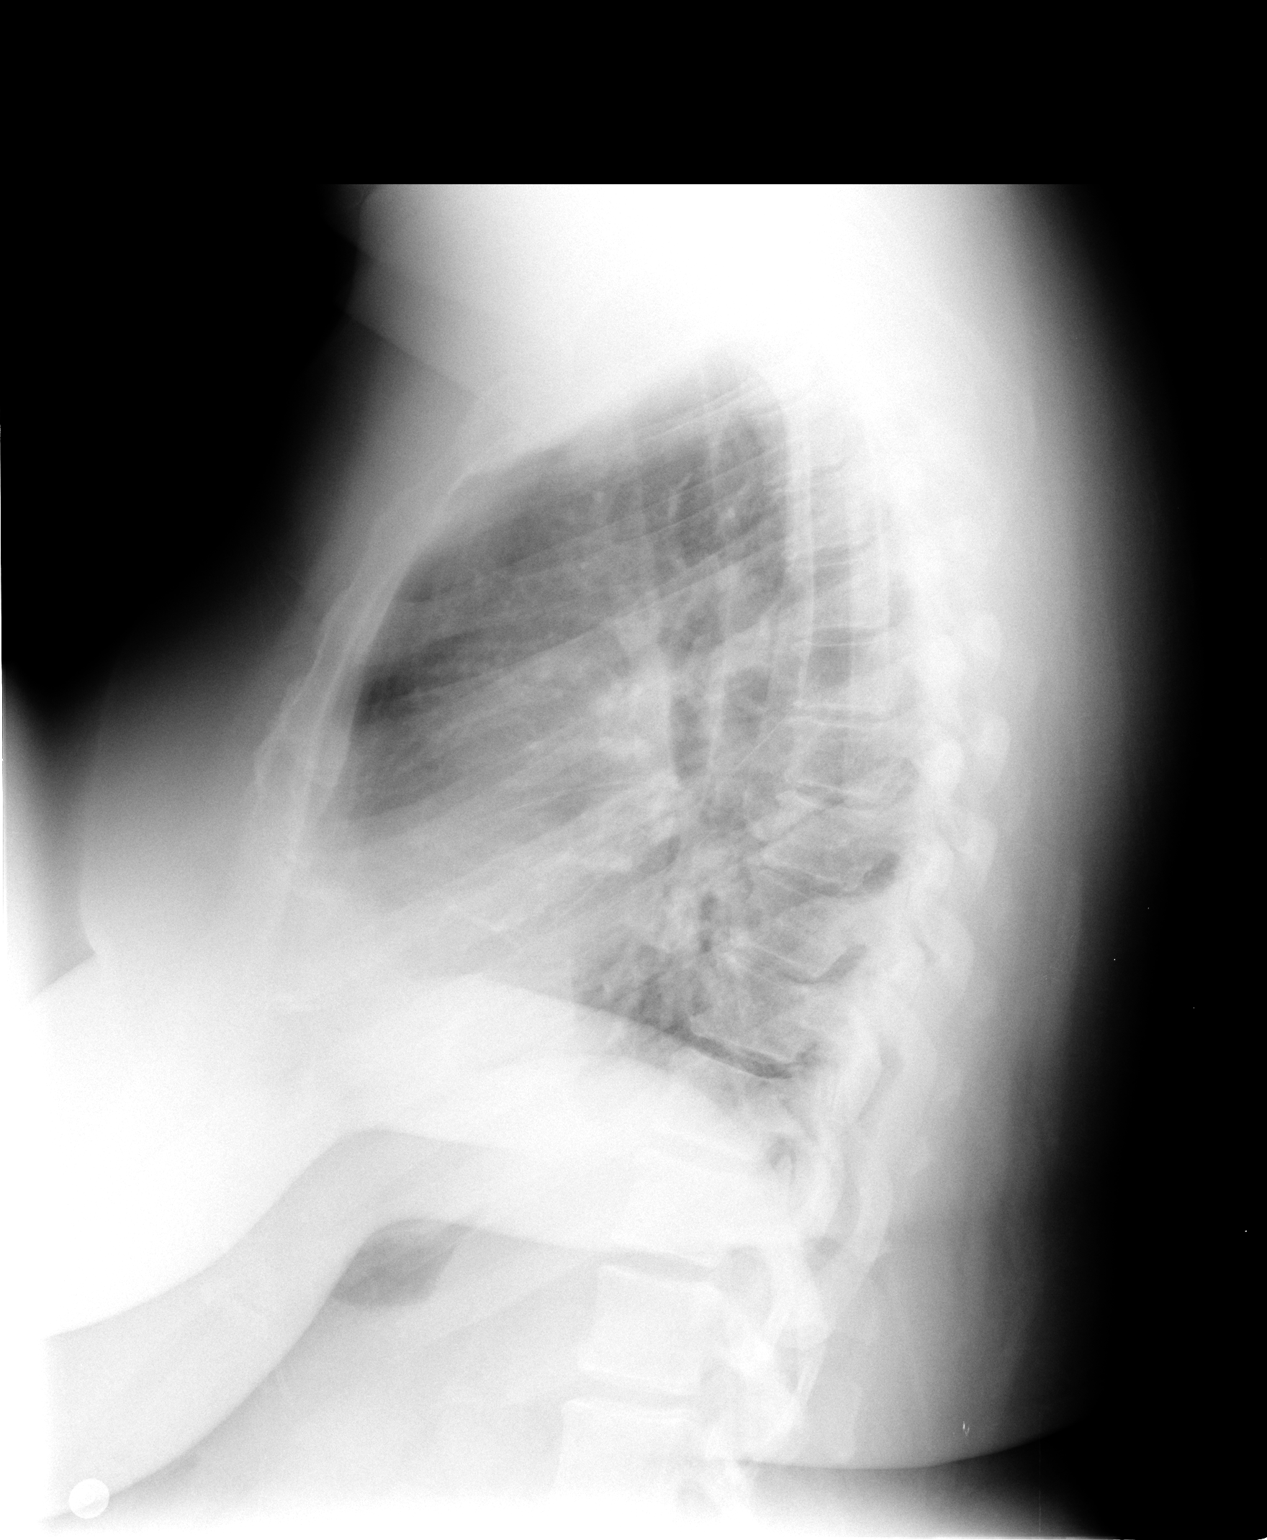

[2 of 2 positions shown; findings below may reference images not displayed]

FINDINGS: Heart is upper normal.  The bronchovascular markings are prominent, but unchanged from the prior study compatible with chronic lung disease.  No infiltrate, edema, or effusion.  Question pulmonary artery hypertension.
IMPRESSION: Chronic lung disease.  No acute abnormality and no interval change.

## 2008-05-17 IMAGING — CR DG CHEST 1V PORT
1 series · 1 of 1 positions shown · non-contrast
Comparison: 05/25/06.

CLINICAL DATA: Back pain and chest pain.
 PORTABLE CHEST - 1 VIEW - 07/13/06:

[view not recorded]
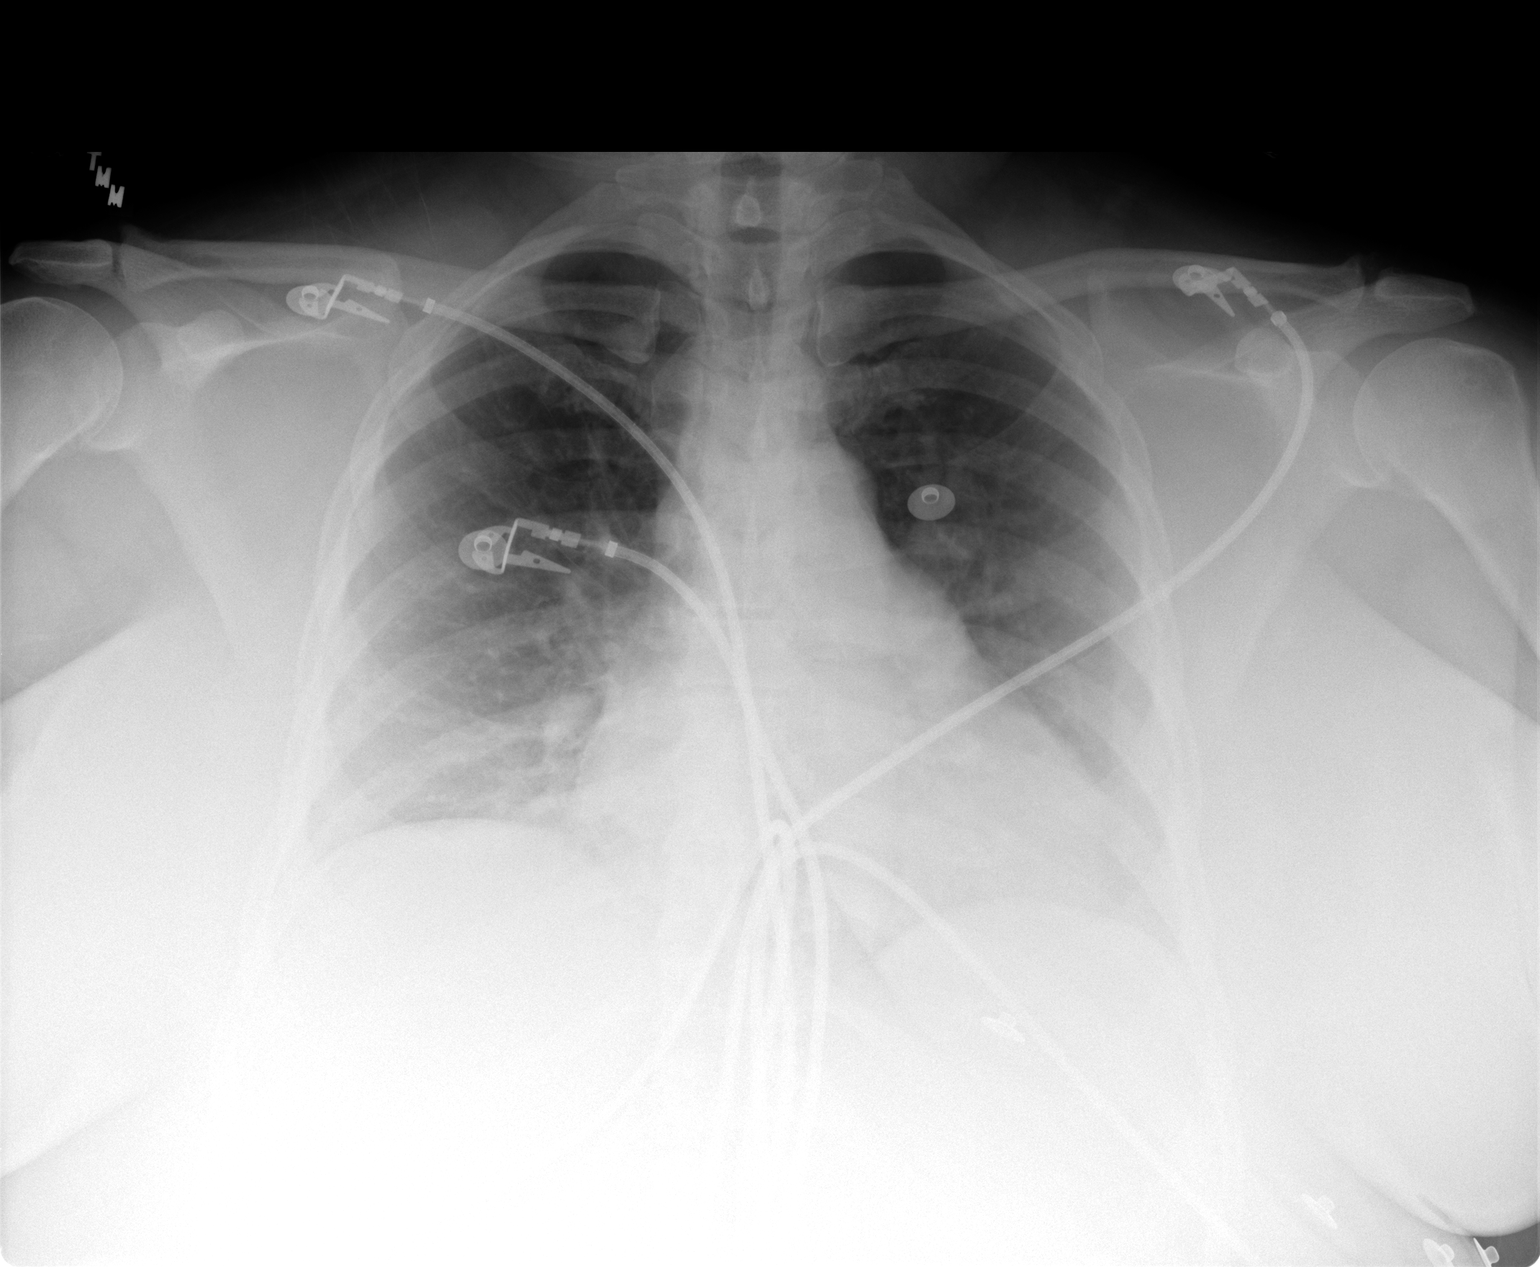

[1 of 1 positions shown; findings below may reference images not displayed]

FINDINGS: The heart size is mildly enlarged. There are no effusions or edema. There is mild bibasilar atelectasis.
IMPRESSION: 1.  Cardiac enlargement without failure.
 2.  Mild bibasilar atelectasis.

## 2008-05-28 ENCOUNTER — Ambulatory Visit: Payer: Self-pay | Admitting: Family Medicine

## 2008-06-24 IMAGING — CT CT ANGIO CHEST
2 of 4 series · 19 of 36 positions shown · IV contrast (APPLIED)
Comparison: 05/25/06.

CLINICAL DATA: 45-year-old, chest pain, back pain.
 CT ANGIOGRAPHY OF CHEST:
TECHNIQUE: Multidetector CT imaging of the chest was performed during bolus injection of intravenous contrast.  Multiplanar CT angiographic image reconstructions were generated to evaluate the vascular anatomy.
 Contrast:  100 cc Omnipaque 300

[Series 5: pulm embolism 2.0 b31f st · axial · 0.59mm/px · z∈[-342,-60]mm · 16 of 153 slices shown]
[im 6/153  lung]
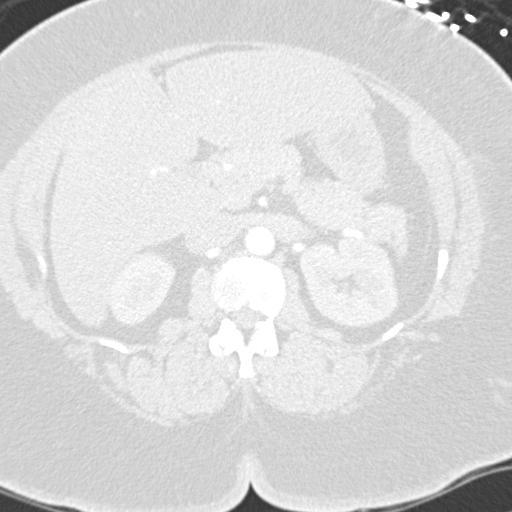
[im 18/153  mediastinal]
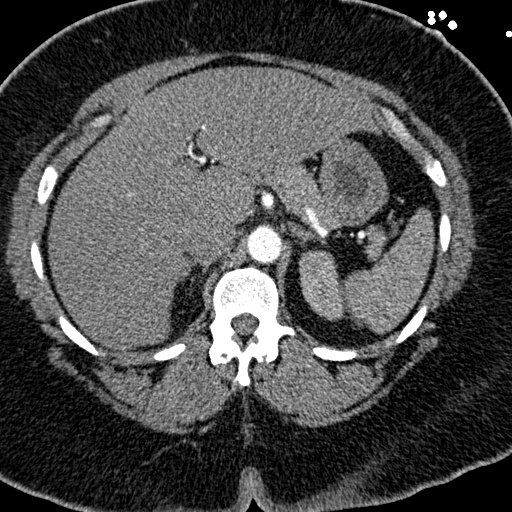
[im 24/153  lung]
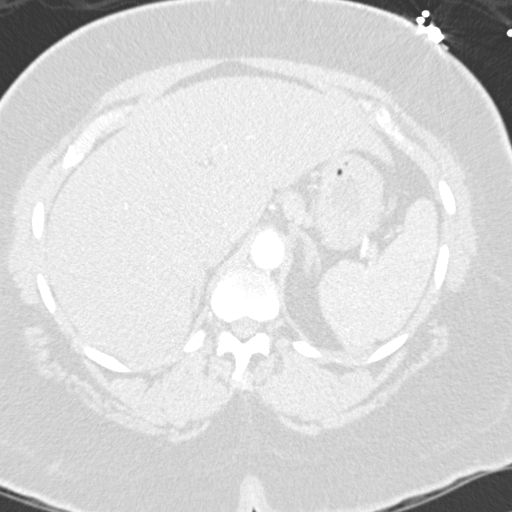
[im 36/153  mediastinal]
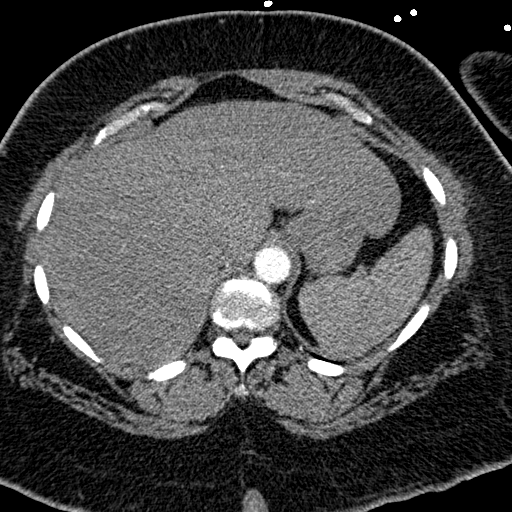
[im 47/153  lung]
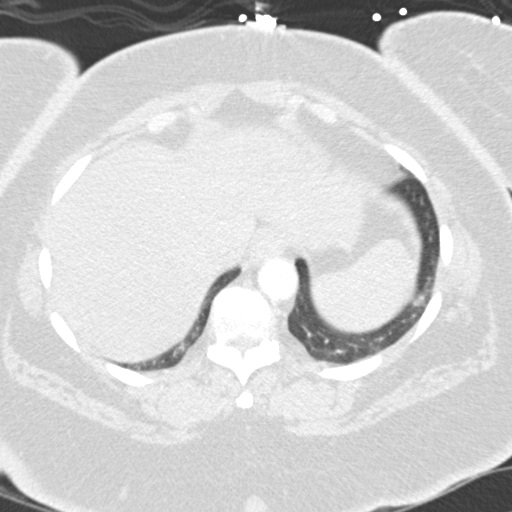
[im 53/153  mediastinal]
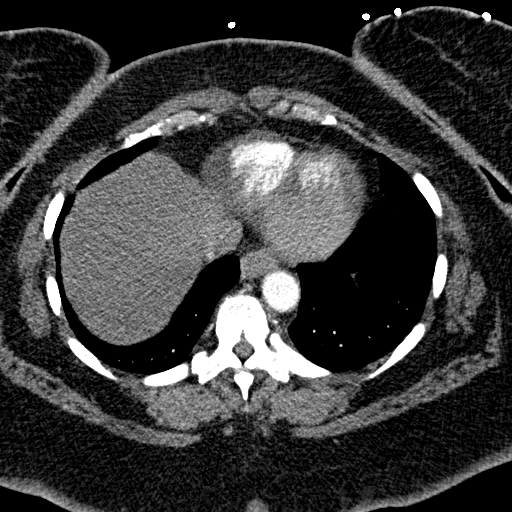
[im 65/153  lung]
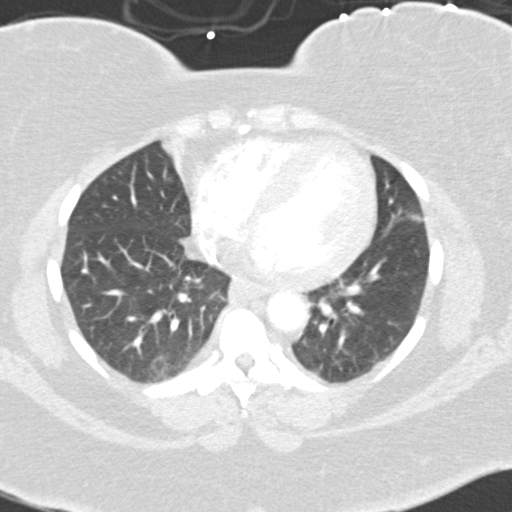
[im 71/153  mediastinal]
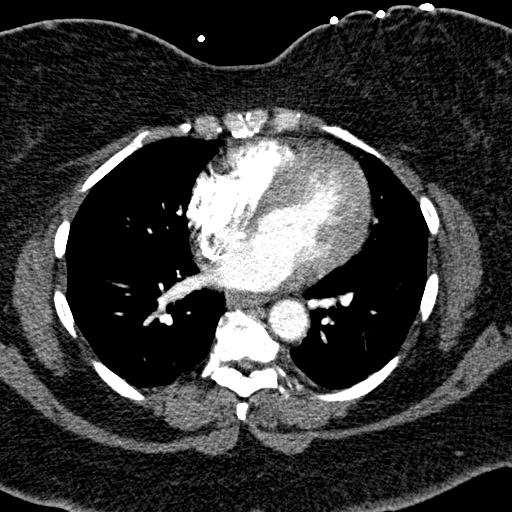
[im 82/153  lung]
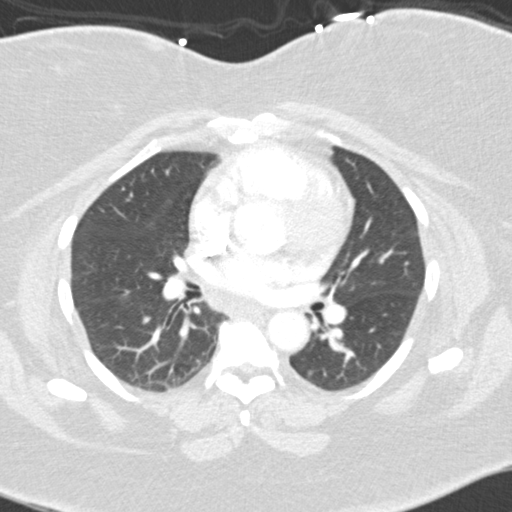
[im 88/153  mediastinal]
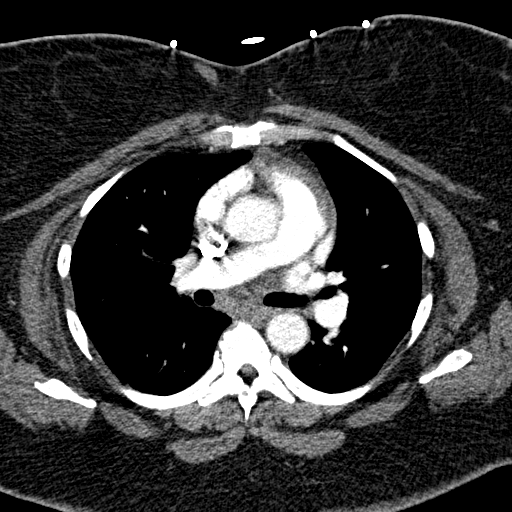
[im 100/153  lung]
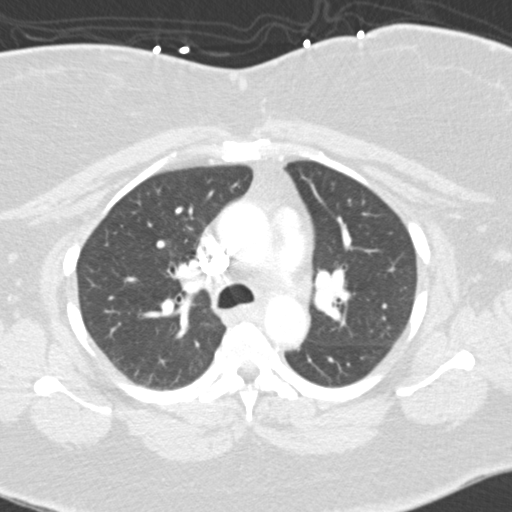
[im 106/153  mediastinal]
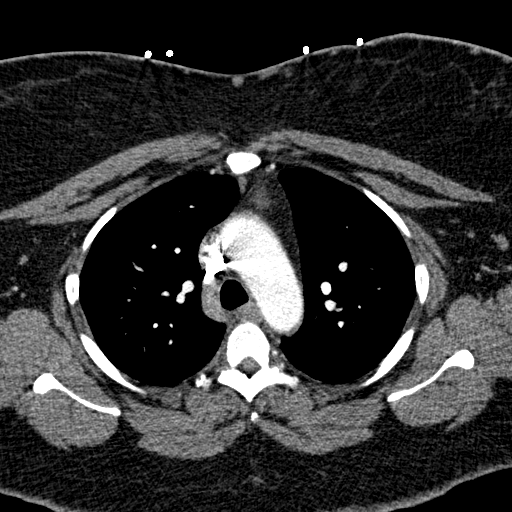
[im 117/153  lung]
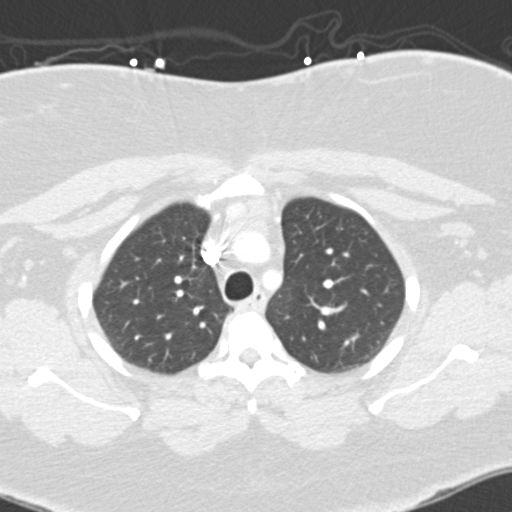
[im 129/153  mediastinal]
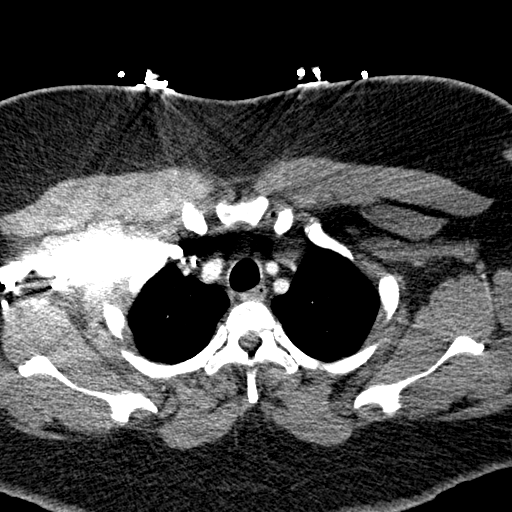
[im 135/153  lung]
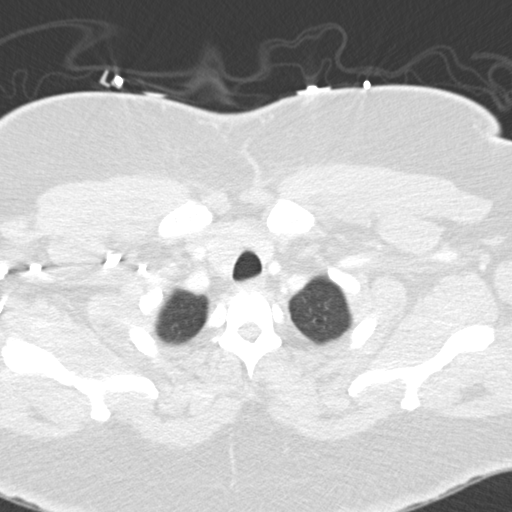
[im 147/153  mediastinal]
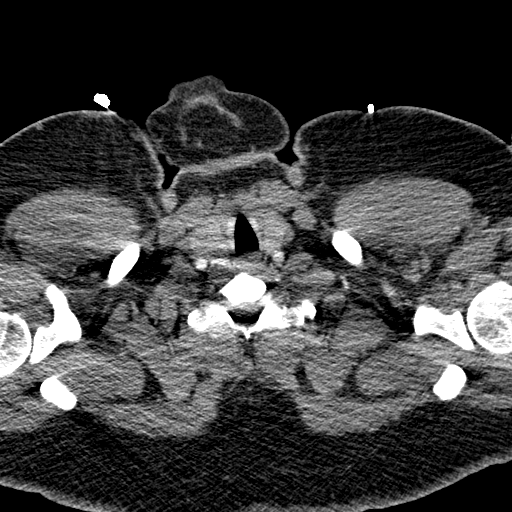

[Series 602: coronal · coronal · 0.60mm/px · 3 of 108 slices shown]
[im 22/108  mediastinal]
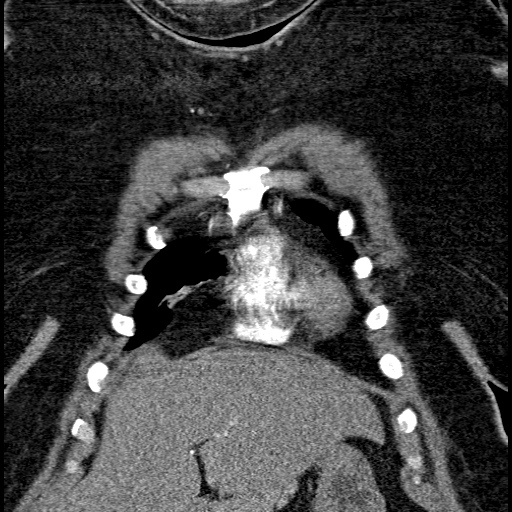
[im 43/108  mediastinal]
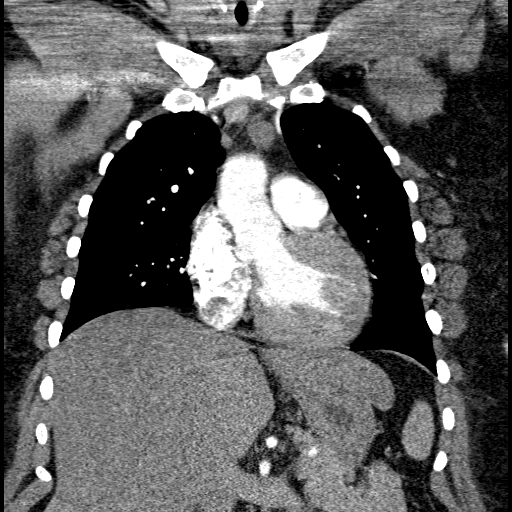
[im 65/108  mediastinal]
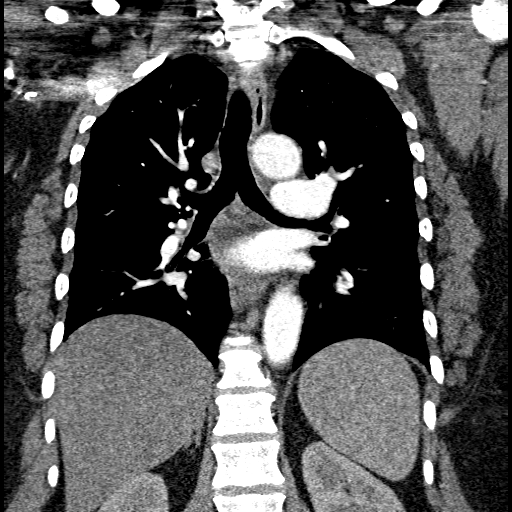

[19 of 36 positions shown; findings below may reference images not displayed]

FINDINGS: Chest wall, soft tissues, and bony structures are unremarkable and stable.  
 Heart size is within normal limits.  No pericardial effusion.  There is a stable rounded soft tissue mass in the anterior mediastinum.  No enlarged mediastinal or hilar lymph nodes. 
 The pulmonary arterial tree is fairly well opacified.  No filling defects are seen to suggest pulmonary emboli.  
 The esophagus is grossly normal.  The aorta is normal in caliber.  No dissection.  No significant pulmonary findings.  There are some streaky areas of dependent atelectasis.  No pleural effusions.  Tracheobronchial tree is grossly normal.
 The upper abdomen demonstrates no significant findings.
IMPRESSION: 1.  No CT evidence for pulmonary emboli. 
 2.  Normal thoracic aorta. 
 3.  Stable anterior mediastinal mass measuring 2.2 x 2.2 cm.  
 4.  Streaky areas of atelectasis but no acute pulmonary findings.  
 5.  Unremarkable upper abdomen.

## 2008-07-01 ENCOUNTER — Ambulatory Visit: Payer: Self-pay | Admitting: Family Medicine

## 2008-07-01 LAB — CONVERTED CEMR LAB
ALT: 26 units/L (ref 0–35)
AST: 19 units/L (ref 0–37)
Albumin: 4.4 g/dL (ref 3.5–5.2)
Alkaline Phosphatase: 63 units/L (ref 39–117)
BUN: 12 mg/dL (ref 6–23)
CO2: 25 meq/L (ref 19–32)
Calcium: 9.5 mg/dL (ref 8.4–10.5)
Chloride: 101 meq/L (ref 96–112)
Cholesterol: 138 mg/dL (ref 0–200)
Creatinine, Ser: 0.85 mg/dL (ref 0.40–1.20)
Glucose, Bld: 116 mg/dL — ABNORMAL HIGH (ref 70–99)
HDL: 55 mg/dL (ref >39)
LDL Cholesterol: 56 mg/dL (ref 0–99)
Potassium: 3.7 meq/L (ref 3.5–5.3)
Sodium: 140 meq/L (ref 135–145)
Total Bilirubin: 0.5 mg/dL (ref 0.3–1.2)
Total CHOL/HDL Ratio: 2.5
Total Protein: 7.5 g/dL (ref 6.0–8.3)
Triglycerides: 137 mg/dL (ref <150)
VLDL: 27 mg/dL (ref 0–40)

## 2008-07-09 ENCOUNTER — Ambulatory Visit: Payer: Self-pay | Admitting: Family Medicine

## 2008-07-16 ENCOUNTER — Emergency Department (HOSPITAL_COMMUNITY): Admission: EM | Admit: 2008-07-16 | Discharge: 2008-07-16 | Payer: Self-pay | Admitting: Emergency Medicine

## 2008-07-27 ENCOUNTER — Ambulatory Visit: Payer: Self-pay | Admitting: Internal Medicine

## 2008-08-18 IMAGING — CR DG CHEST 2V
2 series · 2 of 2 positions shown · non-contrast
Comparison: 07/13/06.

CLINICAL DATA: 45-year-old female with chest pain.
CHEST - 2 VIEW:

[w chest pa *]
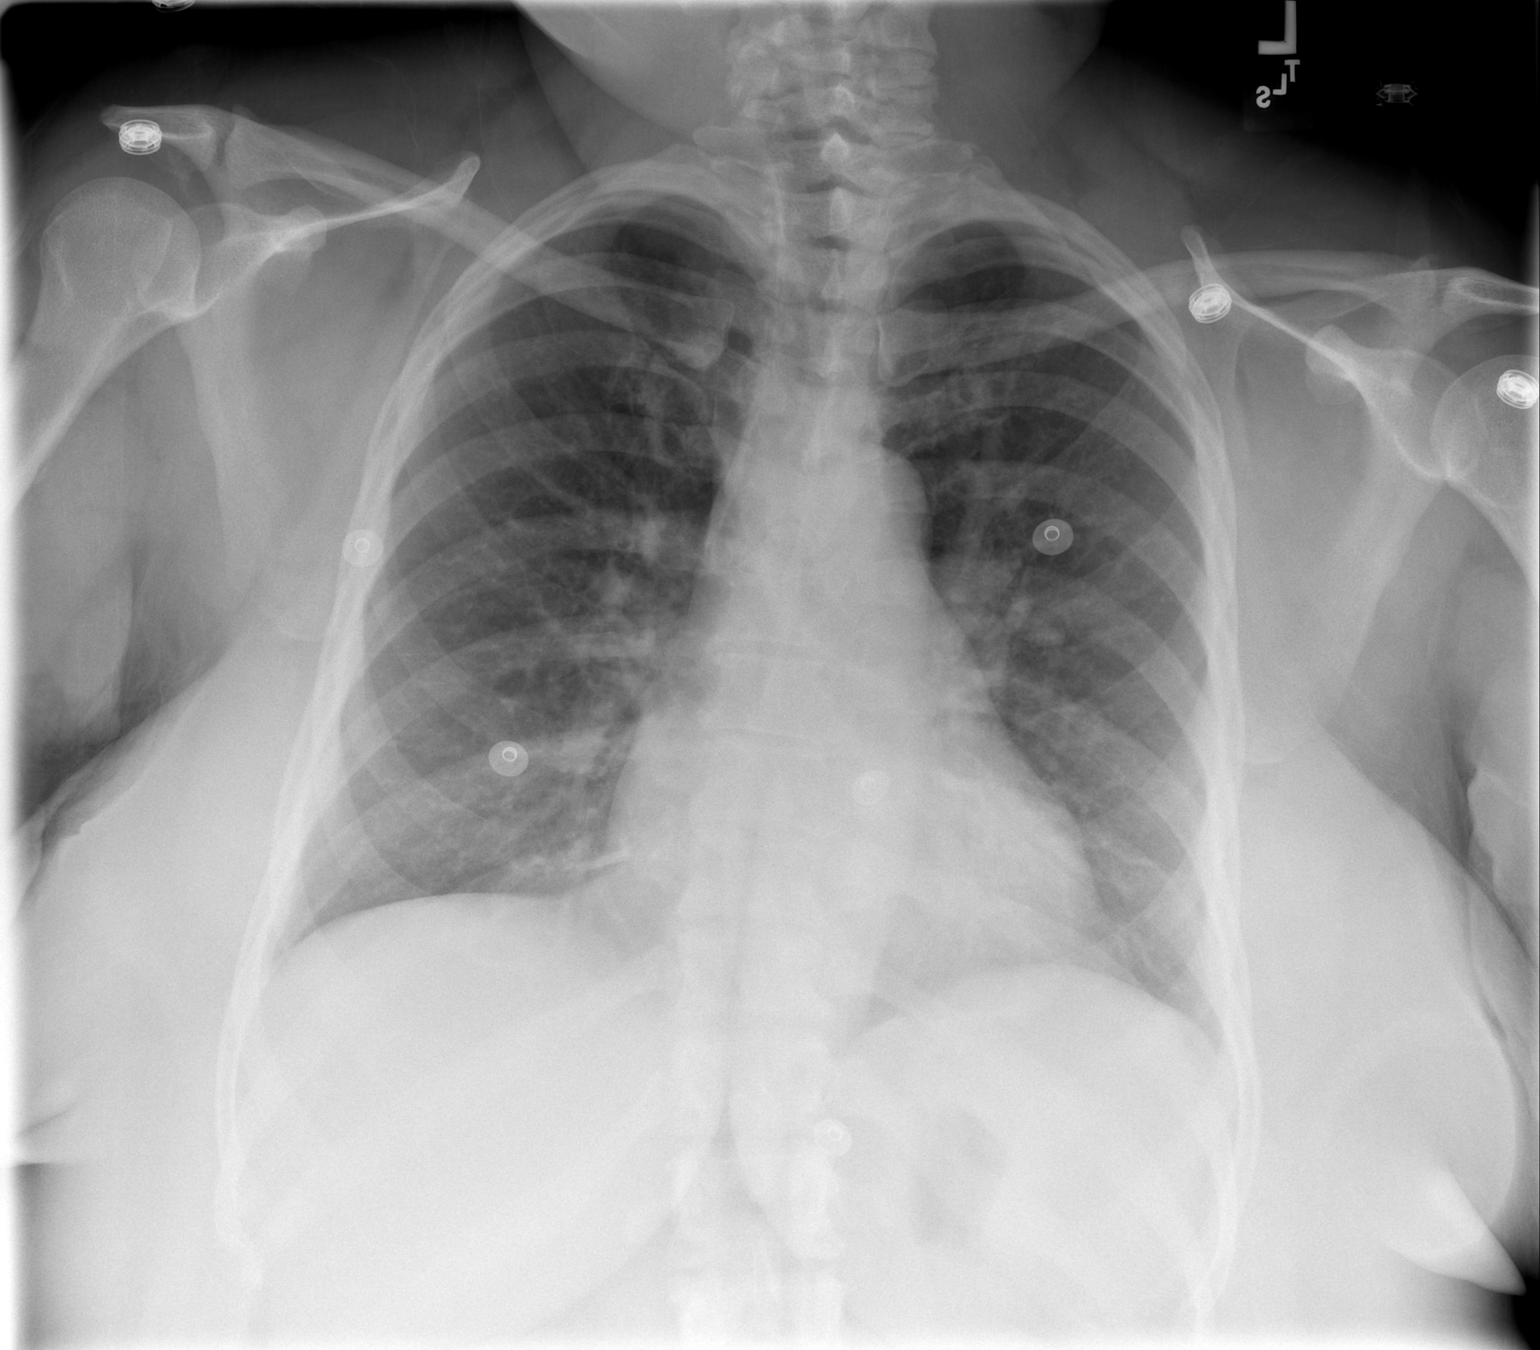

[w chest lat *]
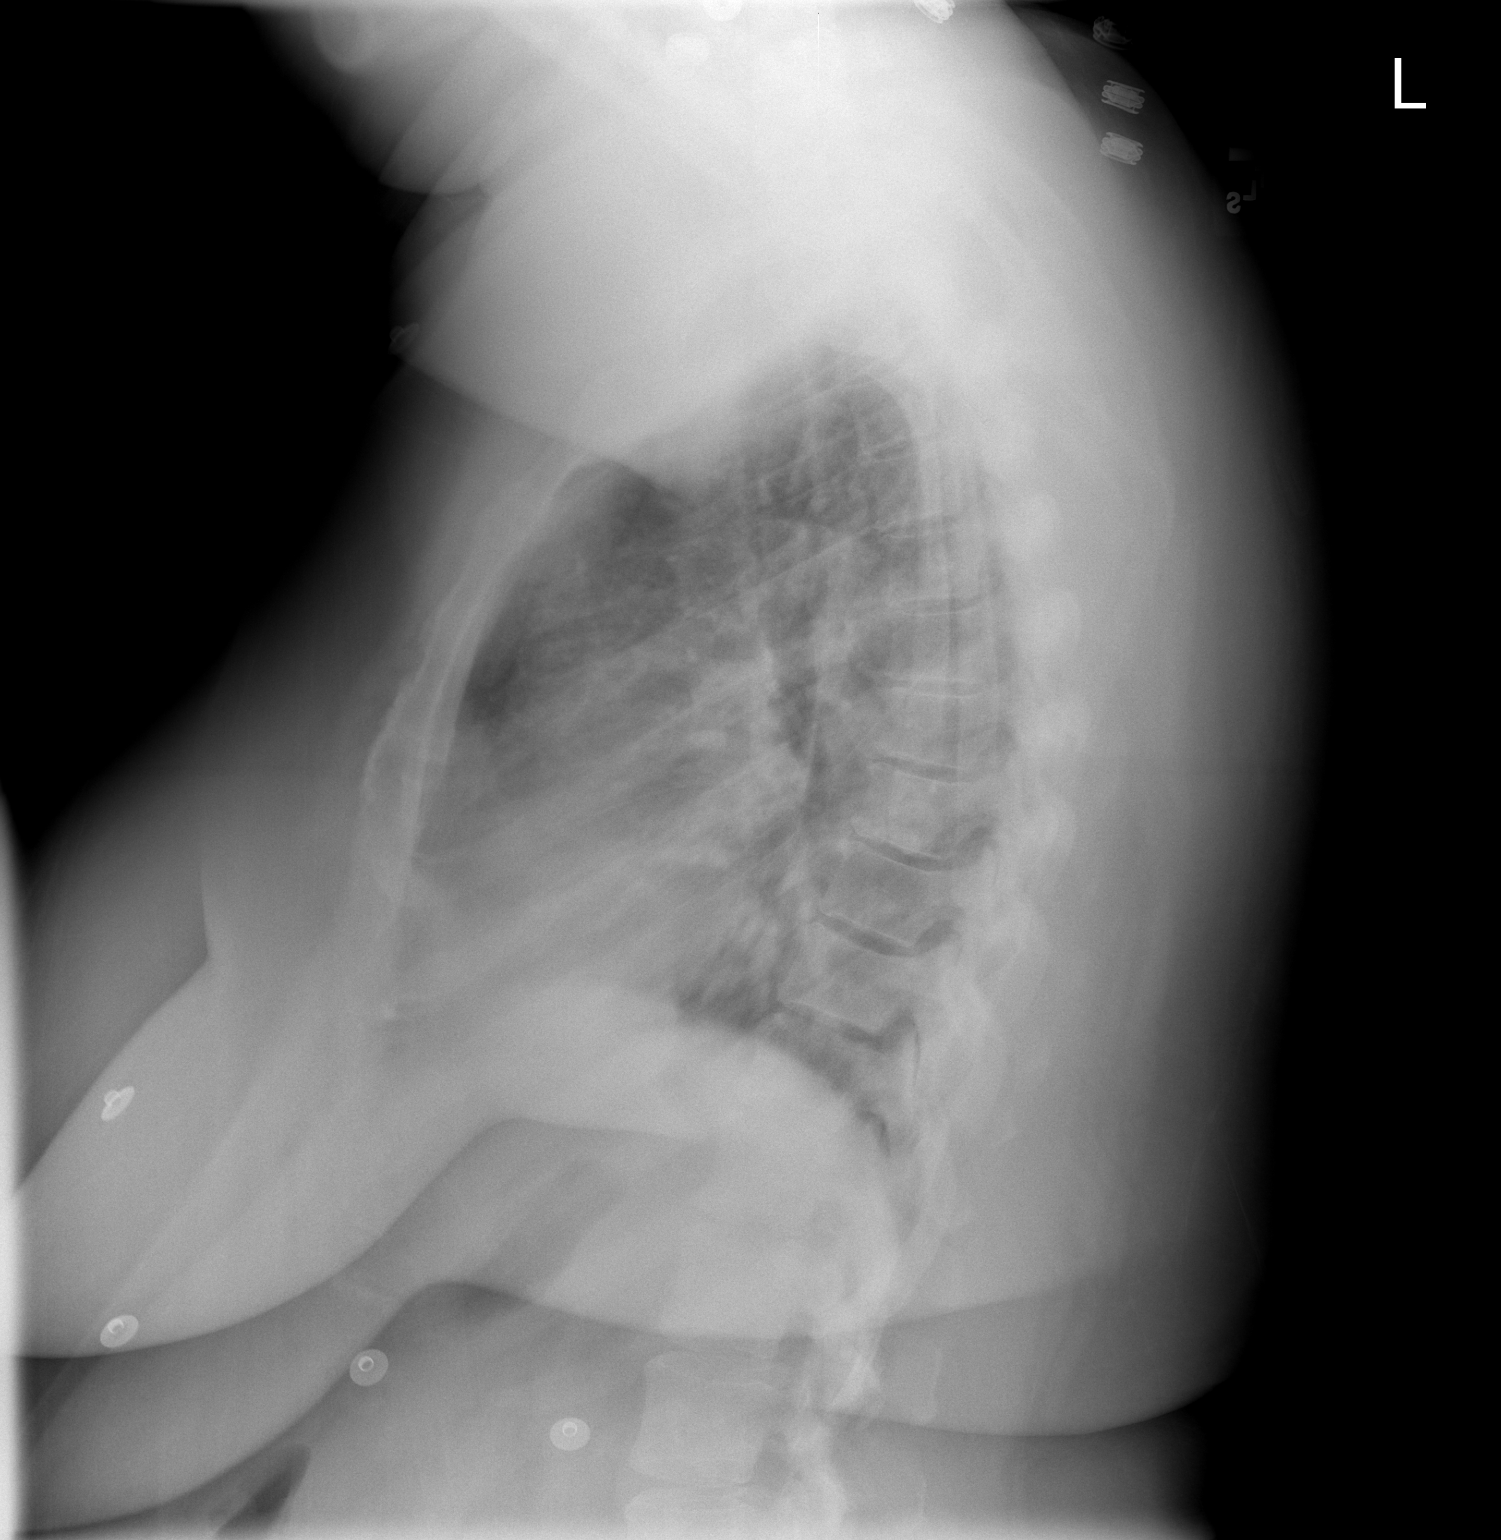

[2 of 2 positions shown; findings below may reference images not displayed]

FINDINGS: Better depth of inspiration on this exam.  The lateral view is obscured by respiratory motion artifact.  Multiple EKG leads overlie the chest.  No pneumothorax.  Cardiac size and mediastinal contour are within normal limits.  There is unchanged linear increased opacity at the right lung base which may reflect subsegmental atelectasis.  here is increased retrocardiac opacity with possible air bronchograms.  No pleural effusion.  No acute osseous abnormality.
IMPRESSION: New retrocardiac opacity suspicious for left lower lobe consolidation such as due to pneumonia.

## 2008-08-19 IMAGING — CT CT CHEST W/O CM
2 of 3 series · 16 of 36 positions shown, 20 images · IV contrast (agent unspecified)
Comparison: Chest CT study 08/20/06.

CLINICAL DATA: Evaluate chest mass.  
 CHEST CT WITHOUT CONTRAST:
TECHNIQUE: Multidetector CT imaging of the chest was performed following the standard protocol without IV contrast.

[Series 2: routine chest · axial · 0.77mm/px · z∈[-324,-69]mm · 13 of 59 slices shown, 17 images]
[im 5/59  mediastinal]
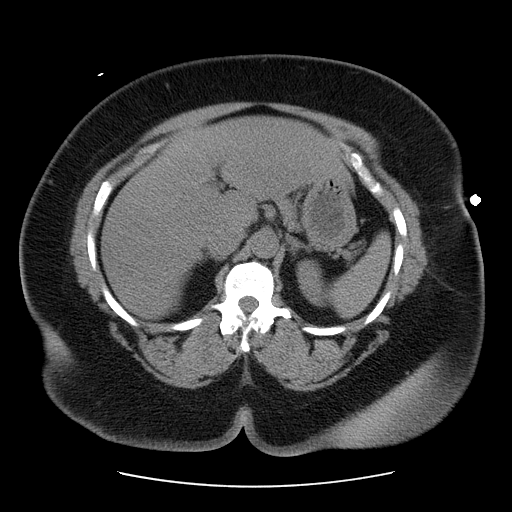
[im 5/59  lung]
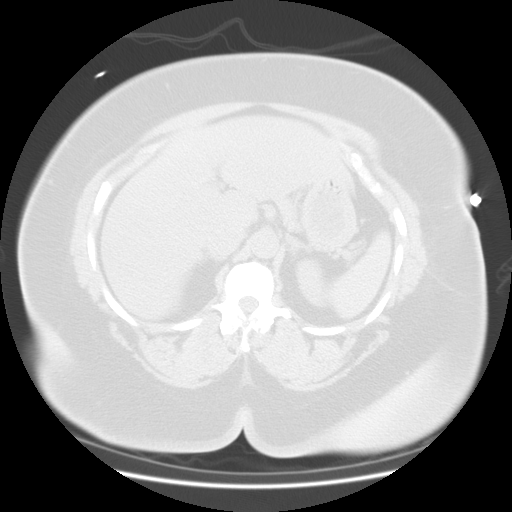
[im 9/59  lung]
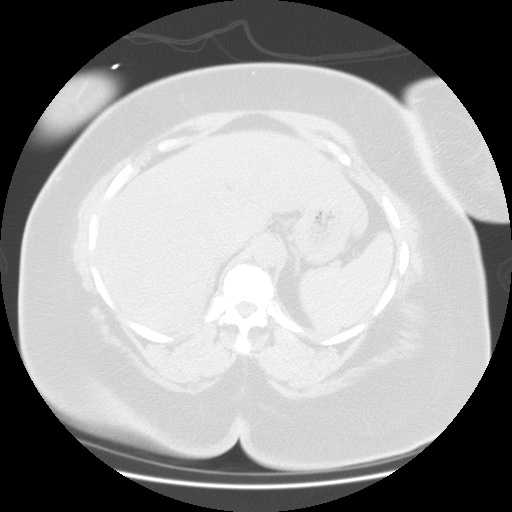
[im 13/59  lung]
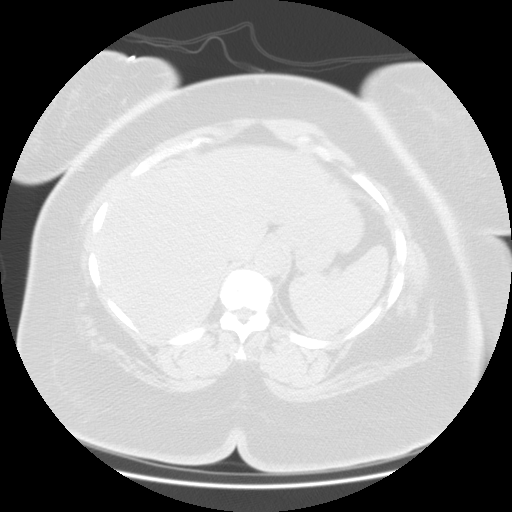
[im 18/59  lung]
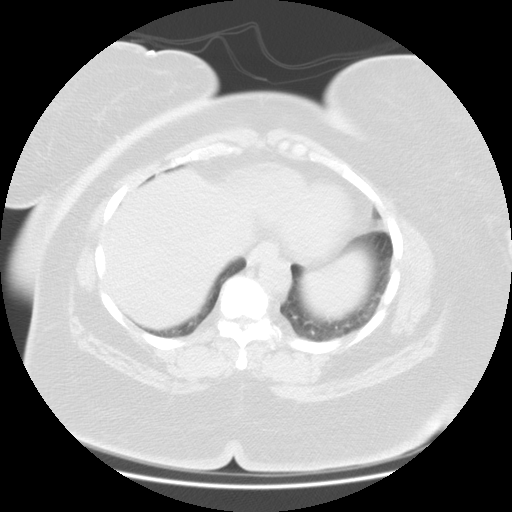
[im 22/59  mediastinal]
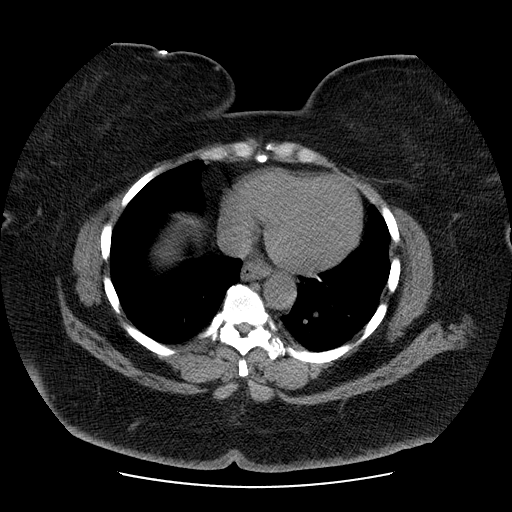
[im 22/59  lung]
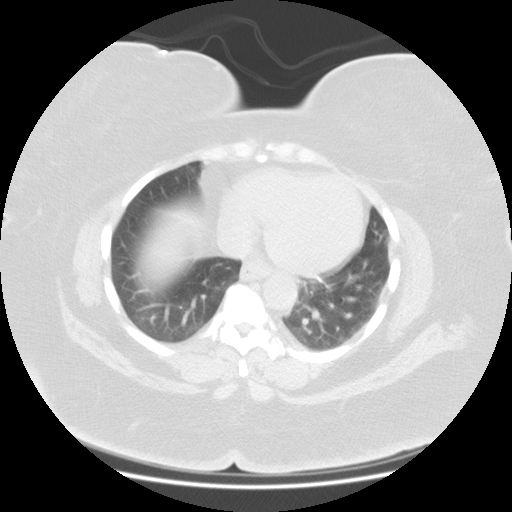
[im 26/59  lung]
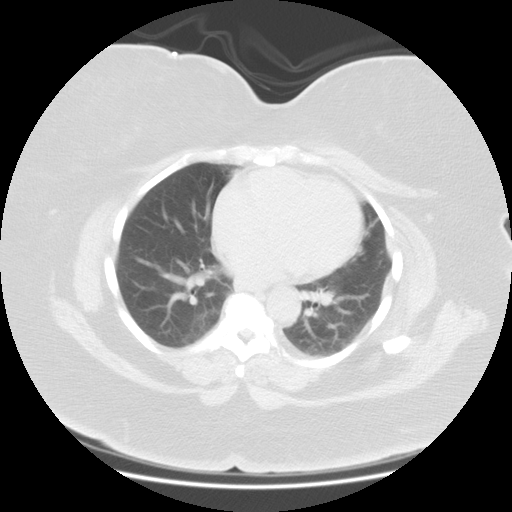
[im 31/59  lung]
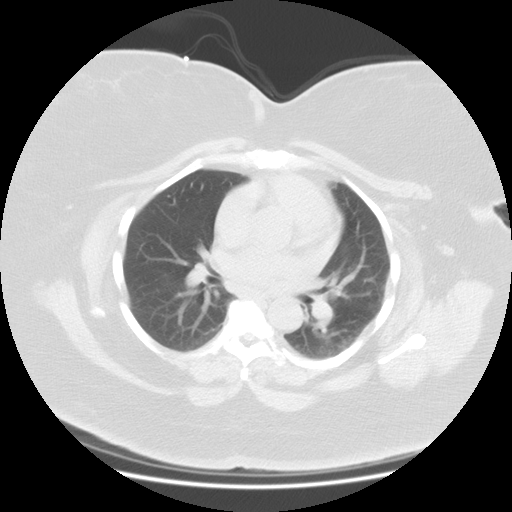
[im 35/59  lung]
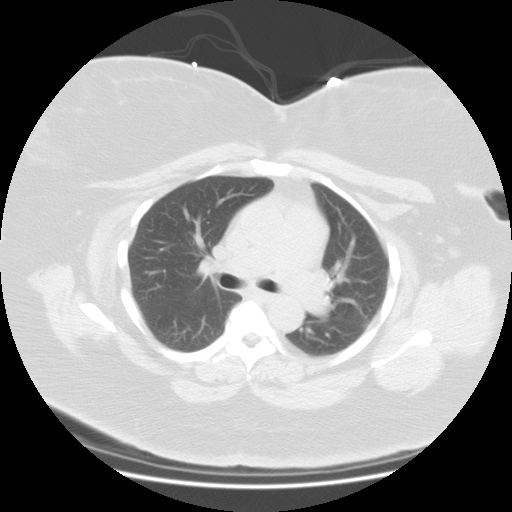
[im 39/59  mediastinal]
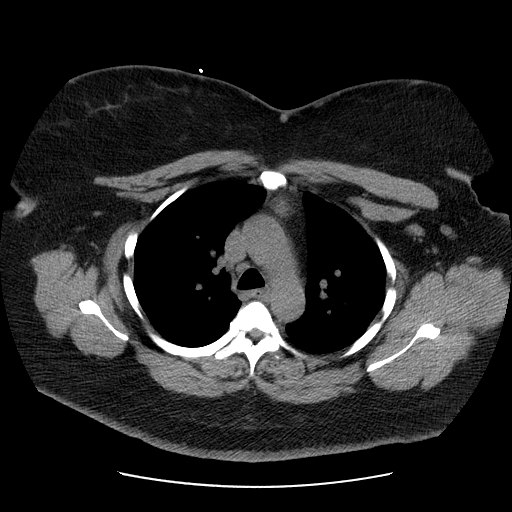
[im 39/59  lung]
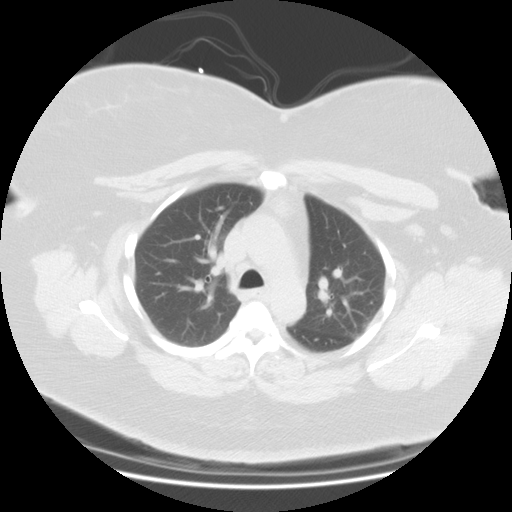
[im 43/59  lung]
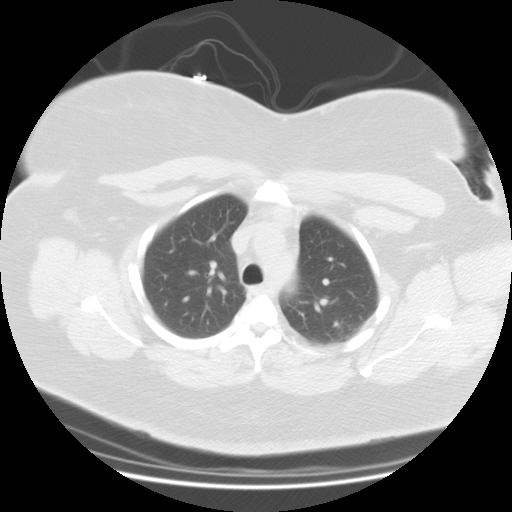
[im 48/59  lung]
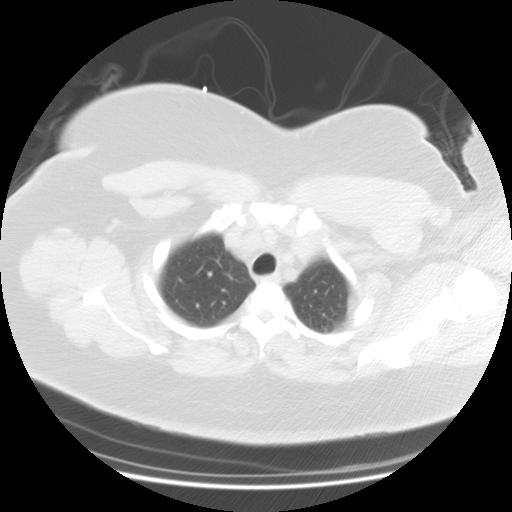
[im 52/59  lung]
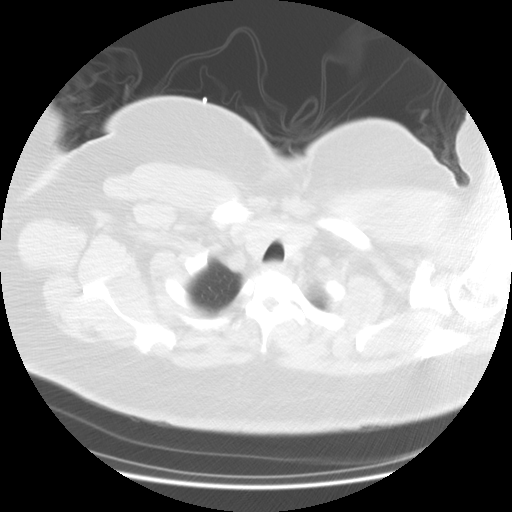
[im 56/59  mediastinal]
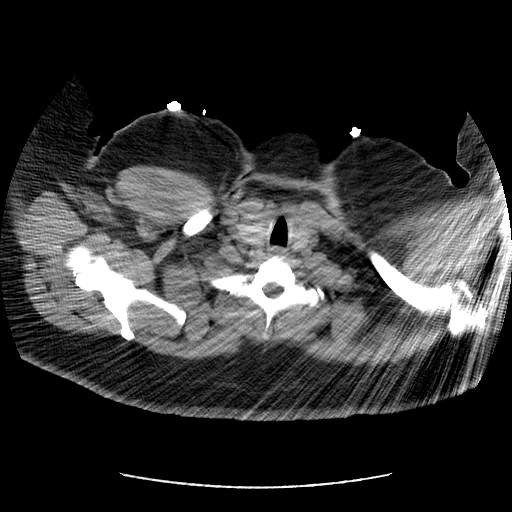
[im 56/59  lung]
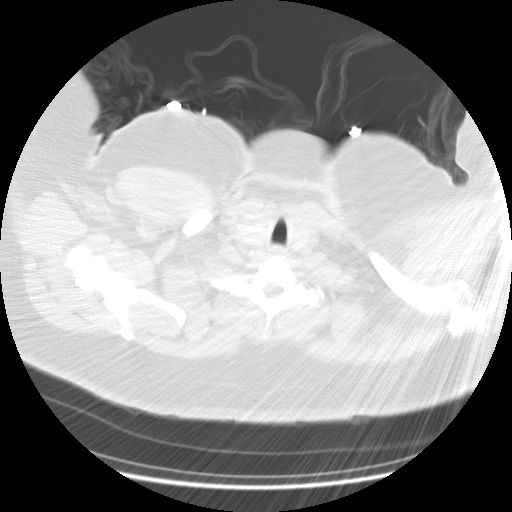

[Series 401: cor chest · coronal · 0.76mm/px · 3 of 138 slices shown]
[im 28/138  lung]
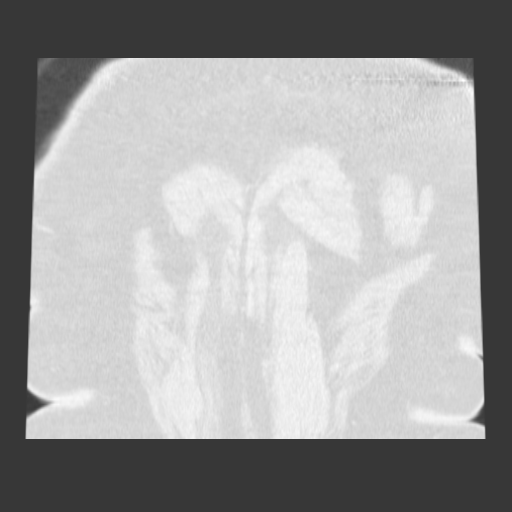
[im 55/138  lung]
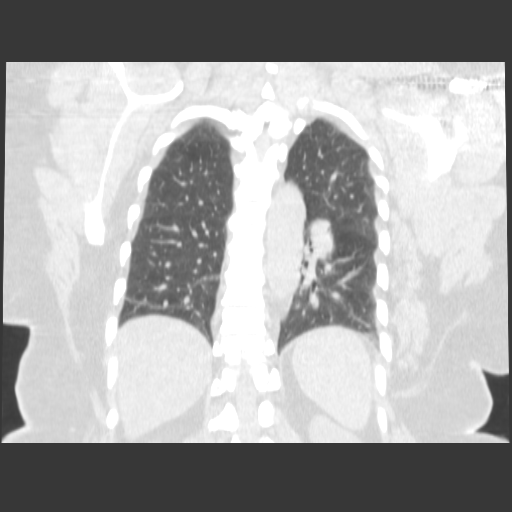
[im 83/138  lung]
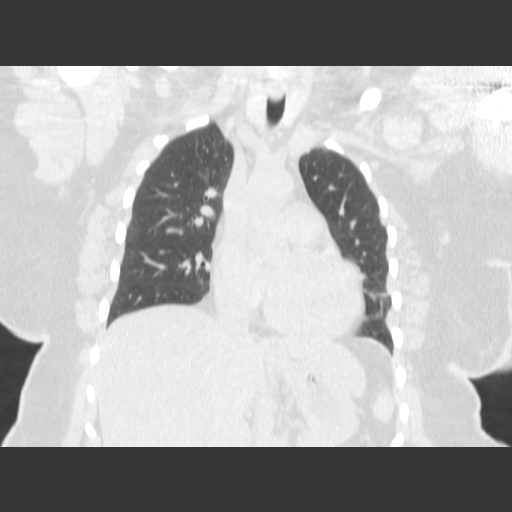

[16 of 36 positions shown; findings below may reference images not displayed]

FINDINGS: Anterior mediastinal mass re-demonstrated.  Previously, this measured 2.2 X 2.2 cm.  Presently, this measures 2.9 cm superior to inferior measured on the sagittal reformat images, 2.0 cm right to right, 2.3 cm anterior to posterior.  No abnormal adenopathy.  The lungs are clear.  There are no pulmonary infiltrates.  There is no mediastinal adenopathy.  The heart is normal.
IMPRESSION: 1.  Anterior mediastinal mass re-demonstrated.  No interval change.  
 2.  Negative for pneumonia or acute infiltrates.

## 2008-08-21 ENCOUNTER — Ambulatory Visit: Payer: Self-pay | Admitting: Family Medicine

## 2008-08-31 IMAGING — CR DG CHEST 2V
2 series · 2 of 2 positions shown · non-contrast
Comparison: Two-view chest x-ray 10/14/2006.

CLINICAL DATA: Chest pain. Shortness of breath. Hemoptysis.

CHEST - 2 VIEW  10/27/2006:

[w chest pa]
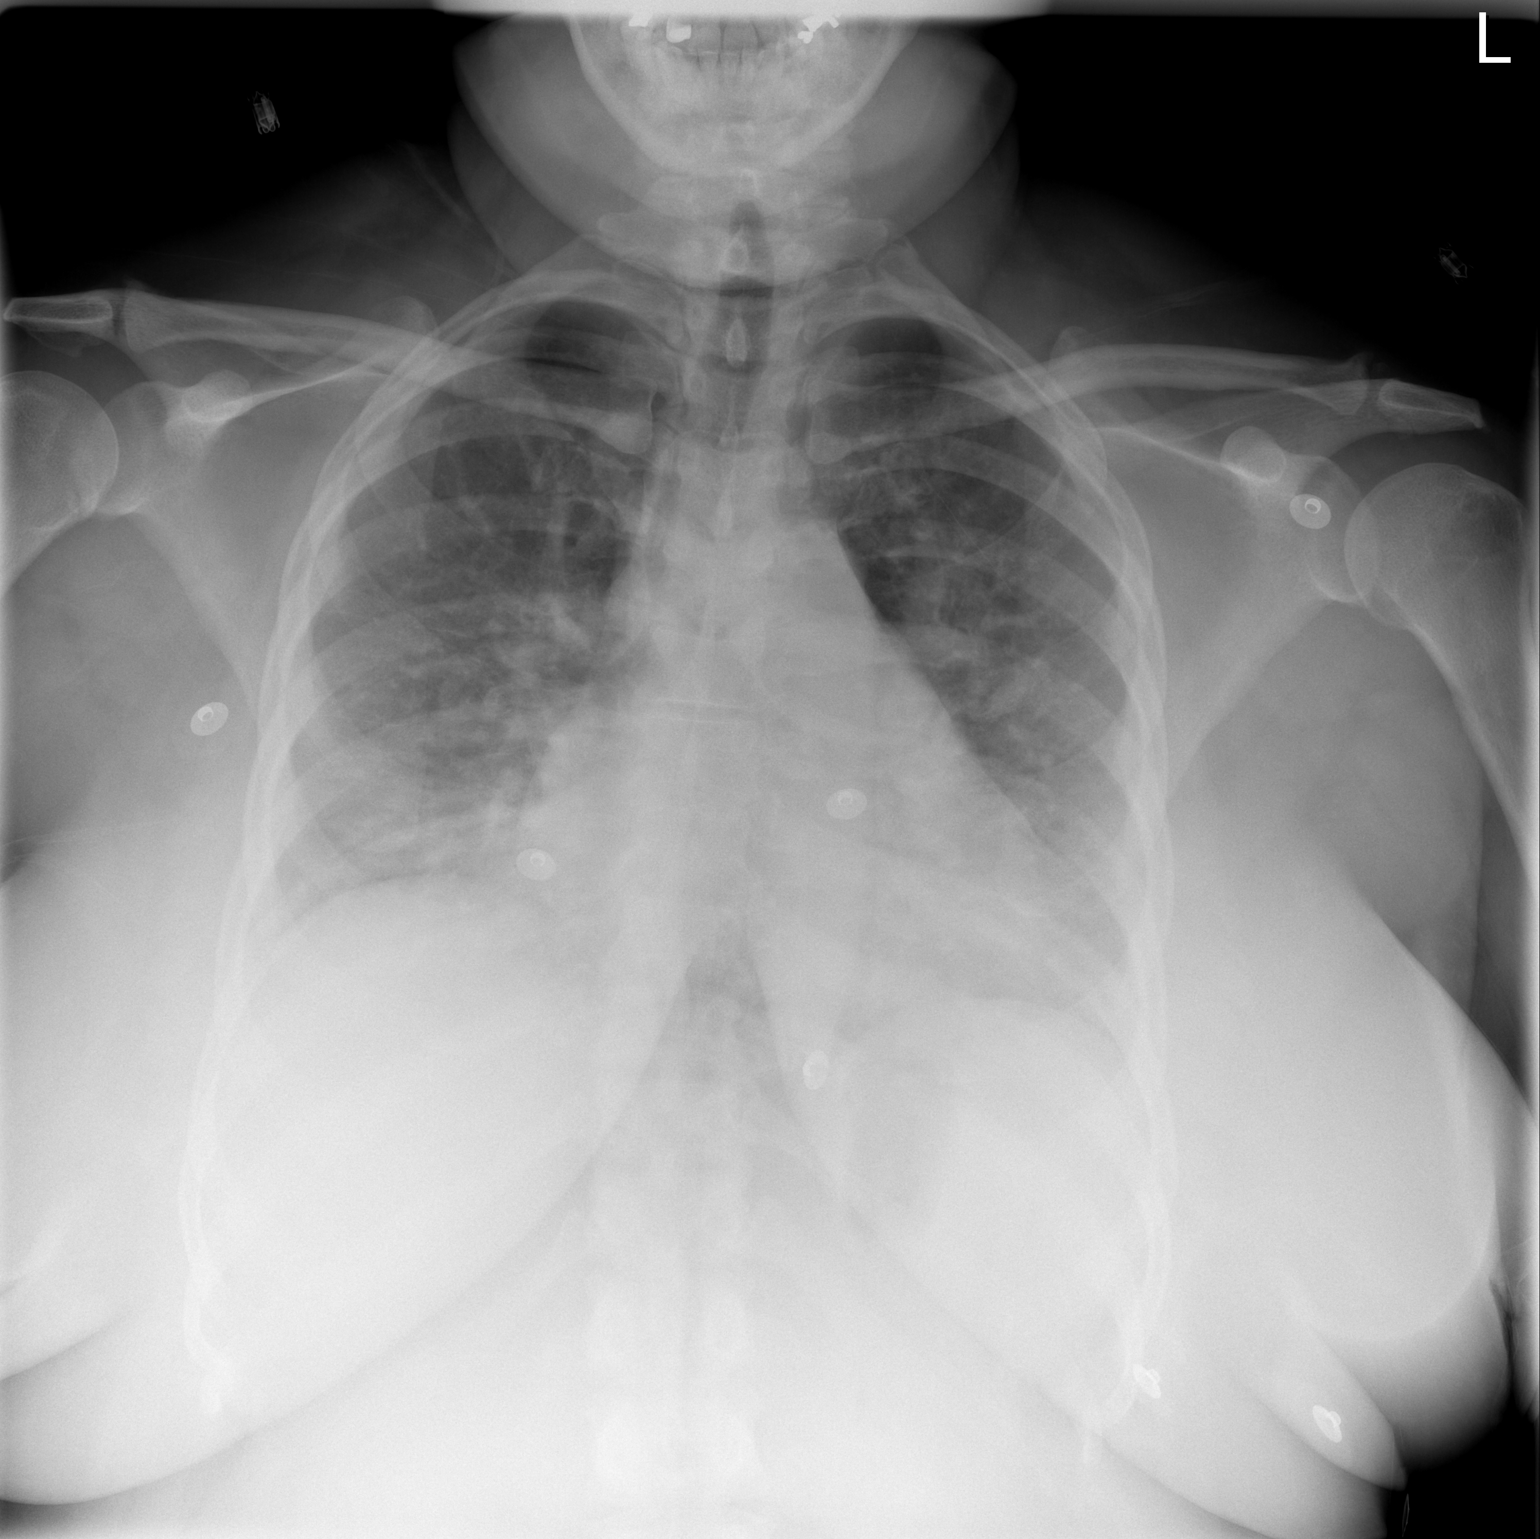

[w chest lat]
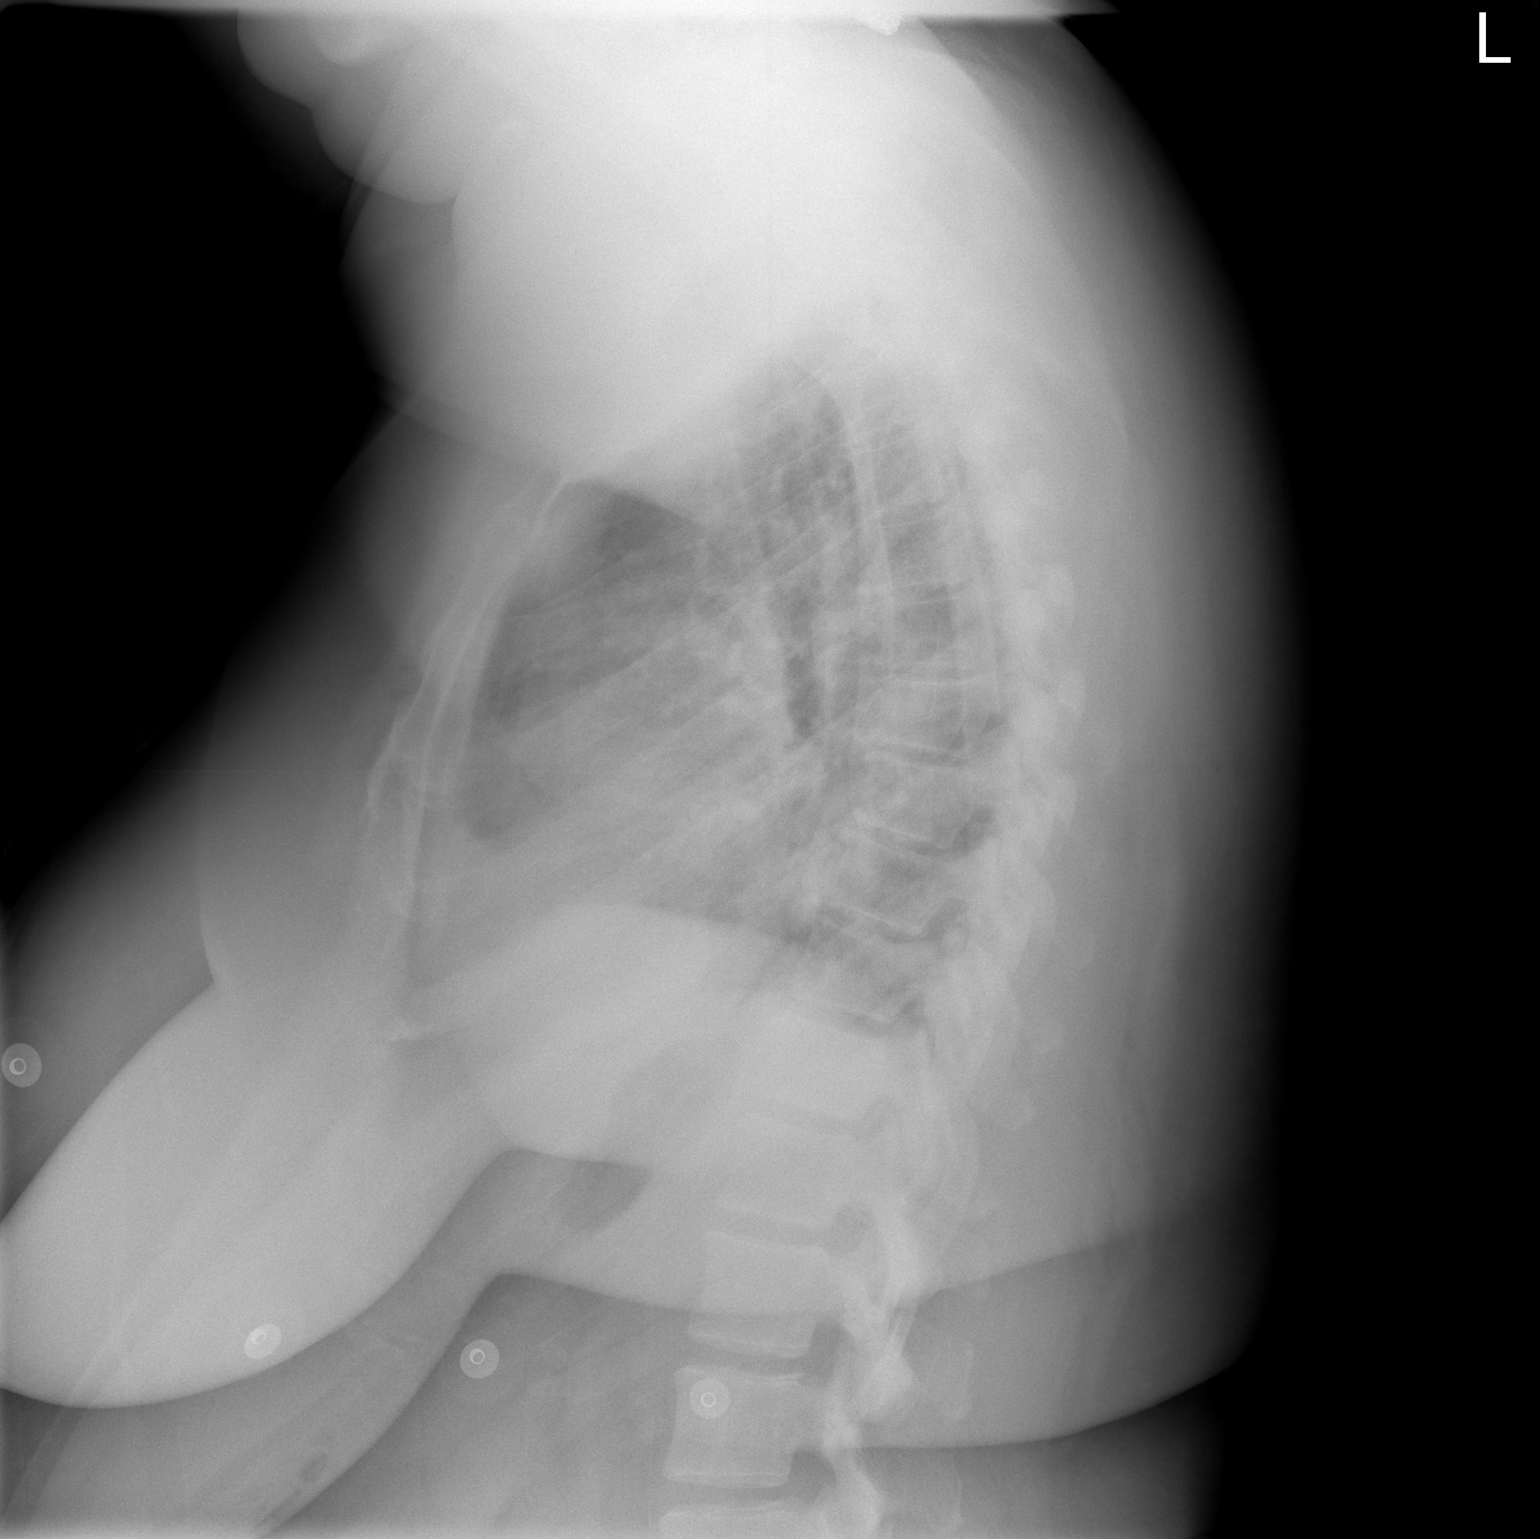

[2 of 2 positions shown; findings below may reference images not displayed]

FINDINGS: Inspiration markedly suboptimal due to body habitus with resulting
atelectasis in the lower lobes. Heart size upper normal and stable. Pulmonary
venous hypertension without overt edema. No pleural effusions. Mild degenerative
changes again noted throughout the thoracic spine.
IMPRESSION: Suboptimal inspiration accounting for atelectasis in the lower lobes. Pulmonary
venous hypertension without overt edema.

## 2008-09-05 IMAGING — CT CT CHEST W/O CM
3 series · 17 of 29 positions shown, 19 images · IV contrast (agent unspecified)
Comparison: Comparison is made with prior examination 10/15/06 and 02/19/03.

CLINICAL DATA: Follow-up anterior mediastinal mass. Hemoptysis for one week. 
CHEST CT WITHOUT CONTRAST:
TECHNIQUE: Multidetector CT imaging of the chest was performed following the standard protocol without IV contrast.

[Series 2: routine chest · axial · 0.70mm/px · z∈[-302,-102]mm · 7 of 56 slices shown, 9 images]
[im 8/56  mediastinal]
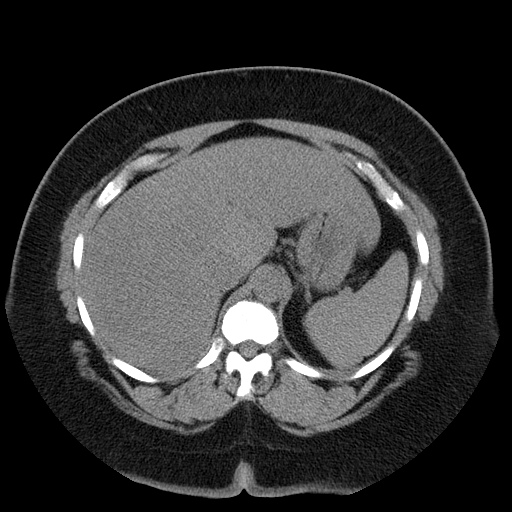
[im 8/56  lung]
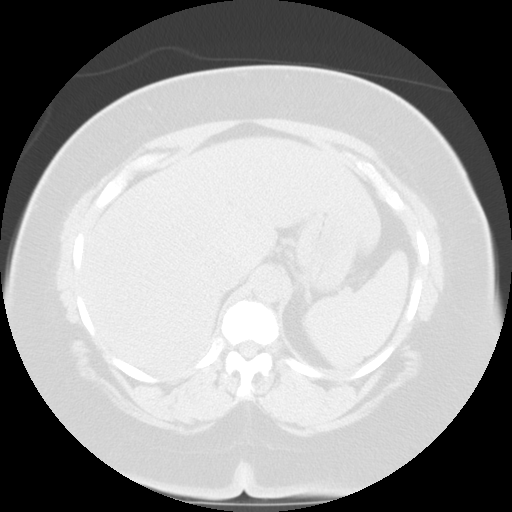
[im 16/56  lung]
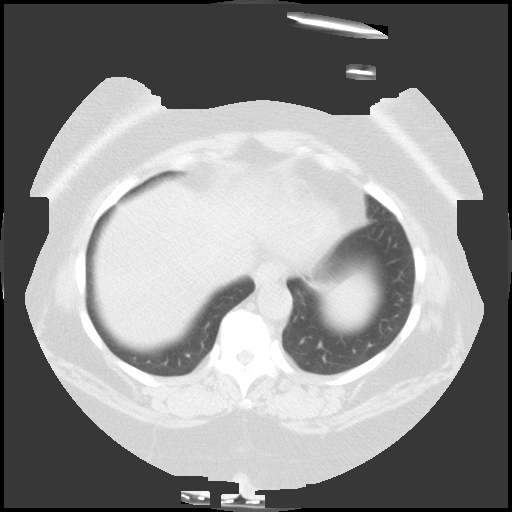
[im 24/56  lung]
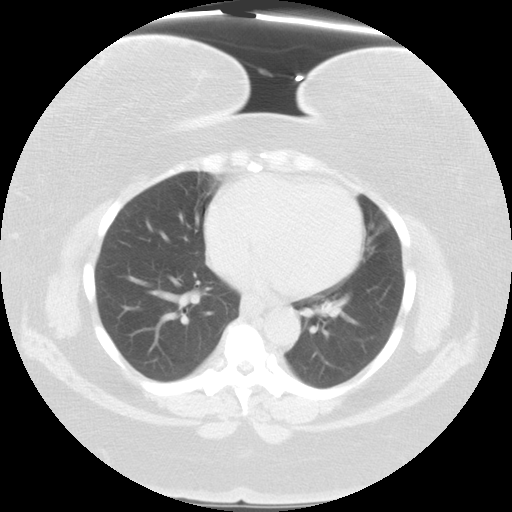
[im 31/56  lung]
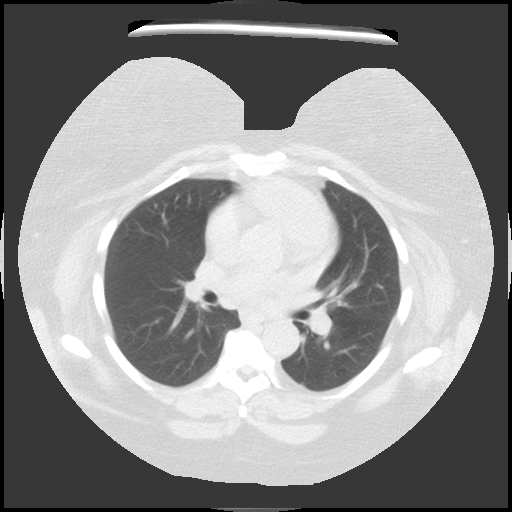
[im 32/56  mediastinal]
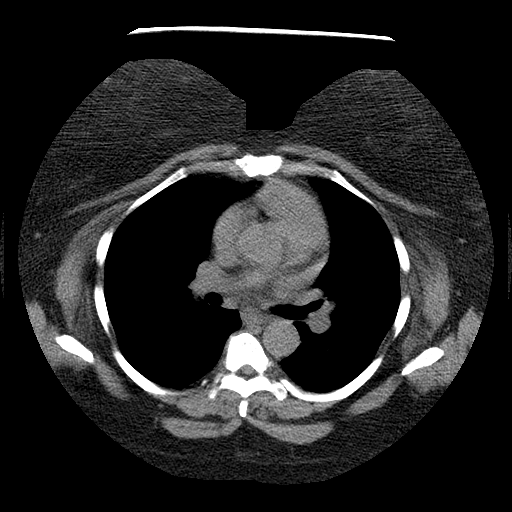
[im 32/56  lung]
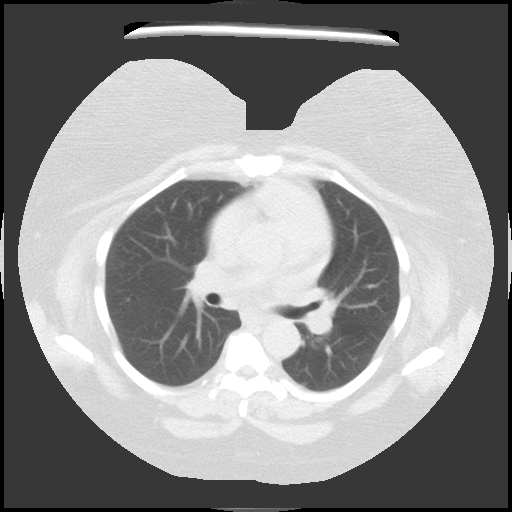
[im 40/56  lung]
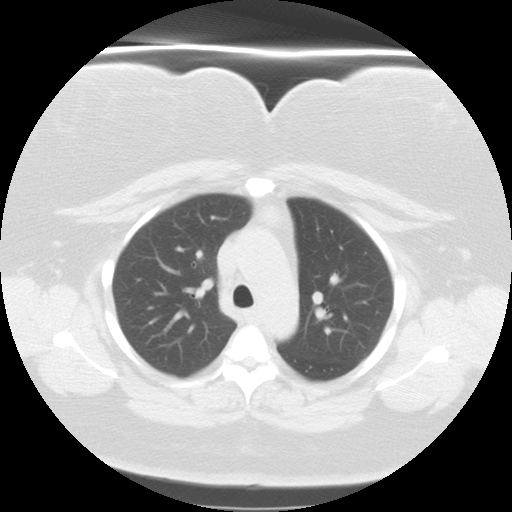
[im 48/56  lung]
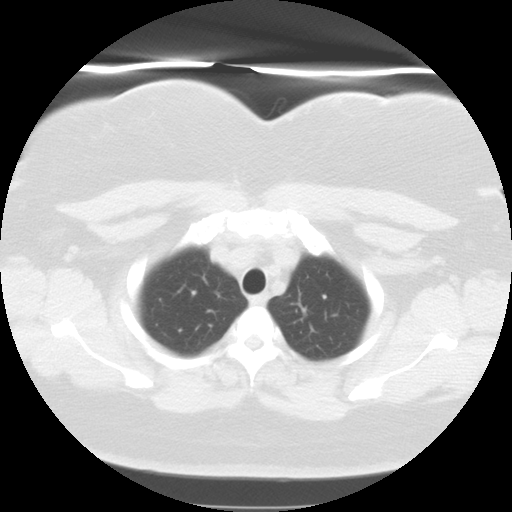

[Series 400: sag chest · sagittal · 0.70mm/px · 8 of 86 slices shown]
[im 9/86  lung]
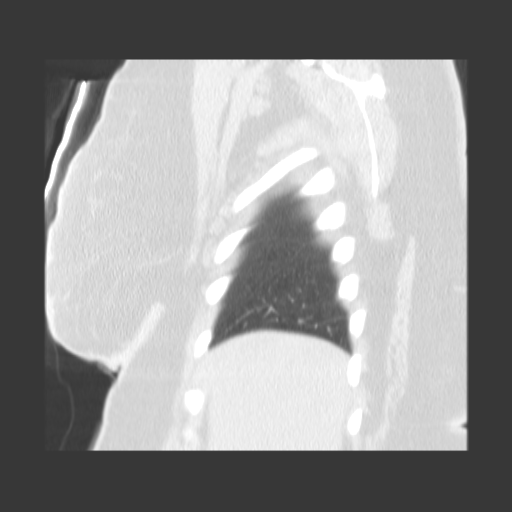
[im 18/86  lung]
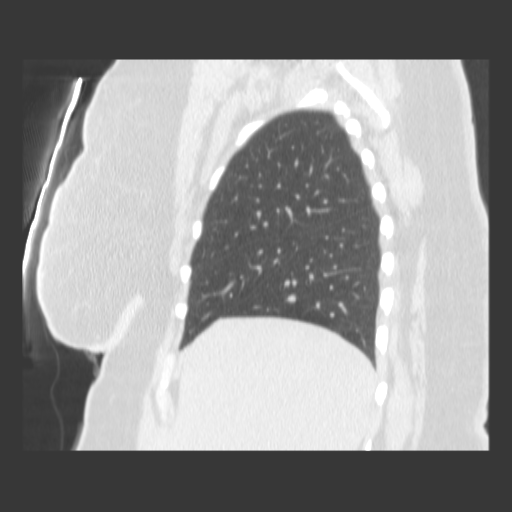
[im 26/86  lung]
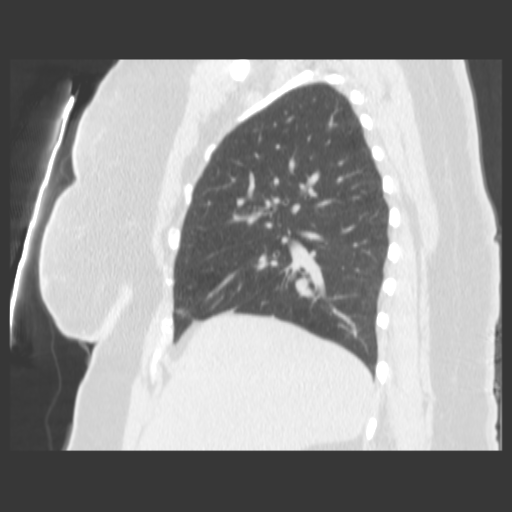
[im 35/86  lung]
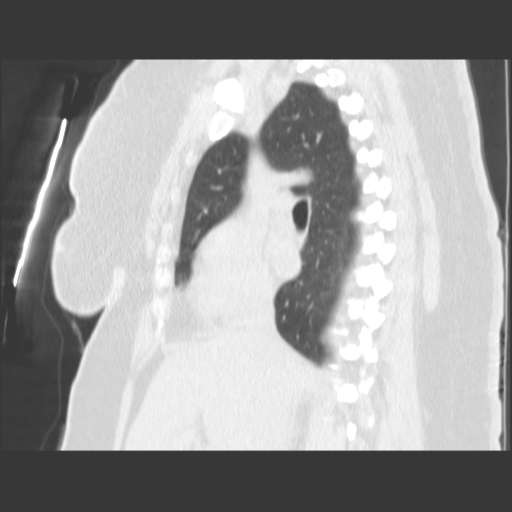
[im 52/86  lung]
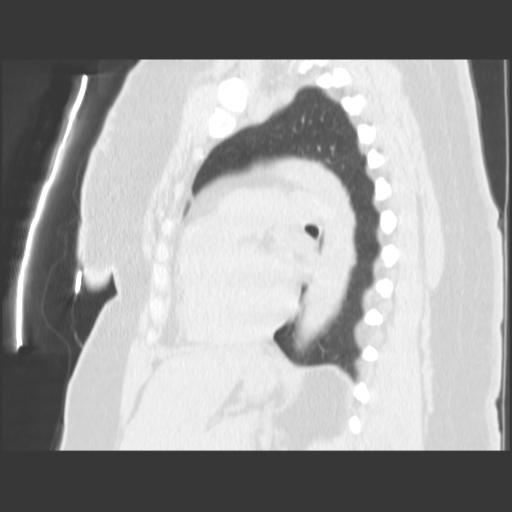
[im 60/86  lung]
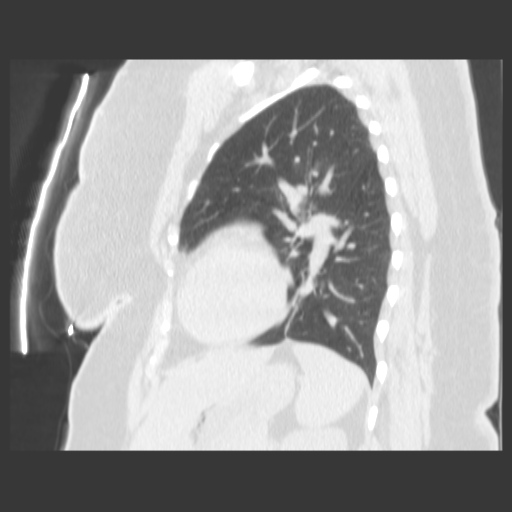
[im 69/86  lung]
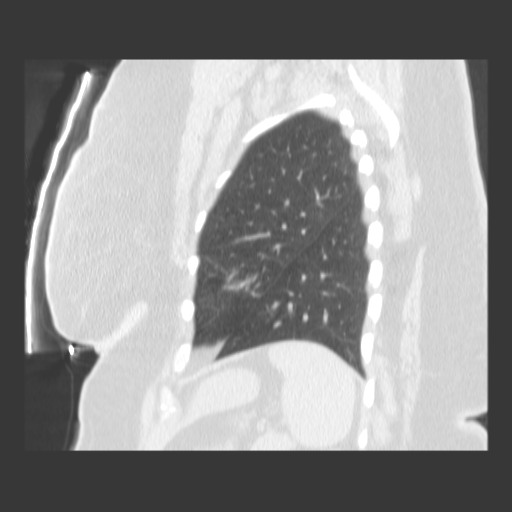
[im 77/86  lung]
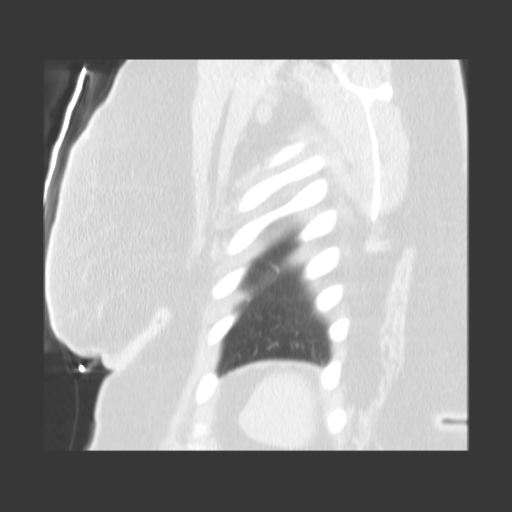

[Series 401: coro chest · coronal · 0.70mm/px · 2 of 69 slices shown]
[im 9/69  lung]
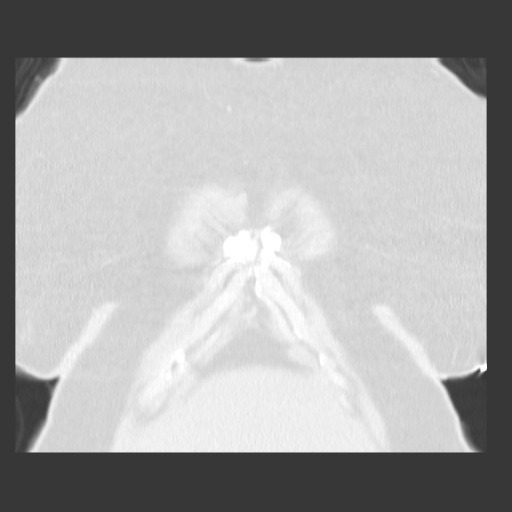
[im 18/69  lung]
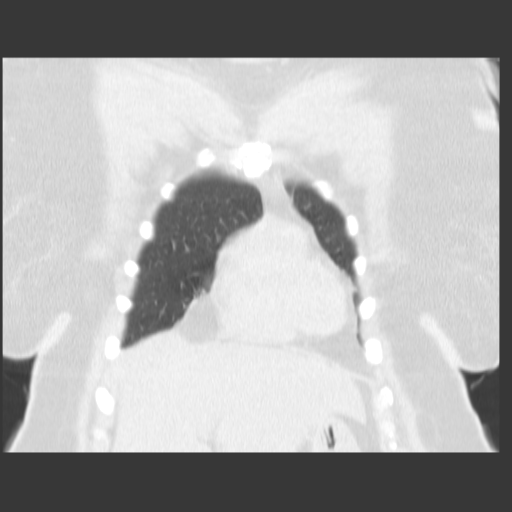

[17 of 29 positions shown; findings below may reference images not displayed]

FINDINGS: There is a stable well-circumscribed soft tissue nodule in the prevascular space measuring 2.3 x 1.9 cm transverse on image 15. This has an attenuation of 37 Hounsfield units. There is a second nodule more superiorly behind the manubrium on the right measuring up to 1.3 cm in diameter on image 11.  Neither of these appears changed from the prior studies. Mild asymmetric enlargement of the right lobe of the thyroid also appears stable. The mediastinum otherwise appears unremarkable as imaged in the unenhanced state. There is no pleural or pericardial effusion. 
The lungs are clear. No endobronchial lesions are identified. The imaged upper abdomen has a stable appearance.
IMPRESSION: 1.  No change in retrosternal anterior mediastinal soft tissue nodules dating back to 2554. The lack of change favors benign thymic lesions, possibly cysts.
2.  No acute findings.   No explanation for hemoptysis.

## 2008-09-20 ENCOUNTER — Emergency Department (HOSPITAL_COMMUNITY): Admission: EM | Admit: 2008-09-20 | Discharge: 2008-09-20 | Payer: Self-pay | Admitting: Emergency Medicine

## 2008-09-22 ENCOUNTER — Ambulatory Visit: Payer: Self-pay | Admitting: Family Medicine

## 2008-09-24 IMAGING — CR DG CERVICAL SPINE COMPLETE 4+V
6 series · 6 of 6 positions shown · non-contrast
Comparison: None.

CLINICAL DATA: Neck and shoulder pain/hoarseness.  
 CERVICAL SPINE ? 4 VIEW:

[w c-spine lat]
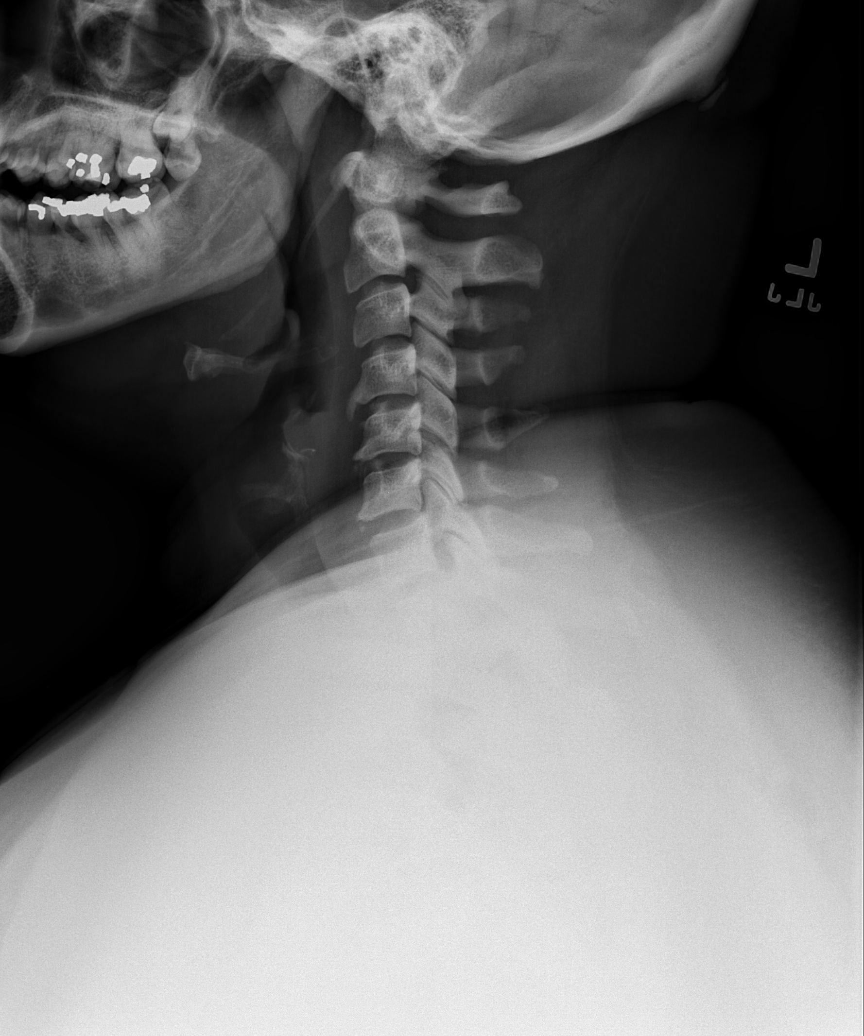

[w c-spine oblique (1 of 2)]
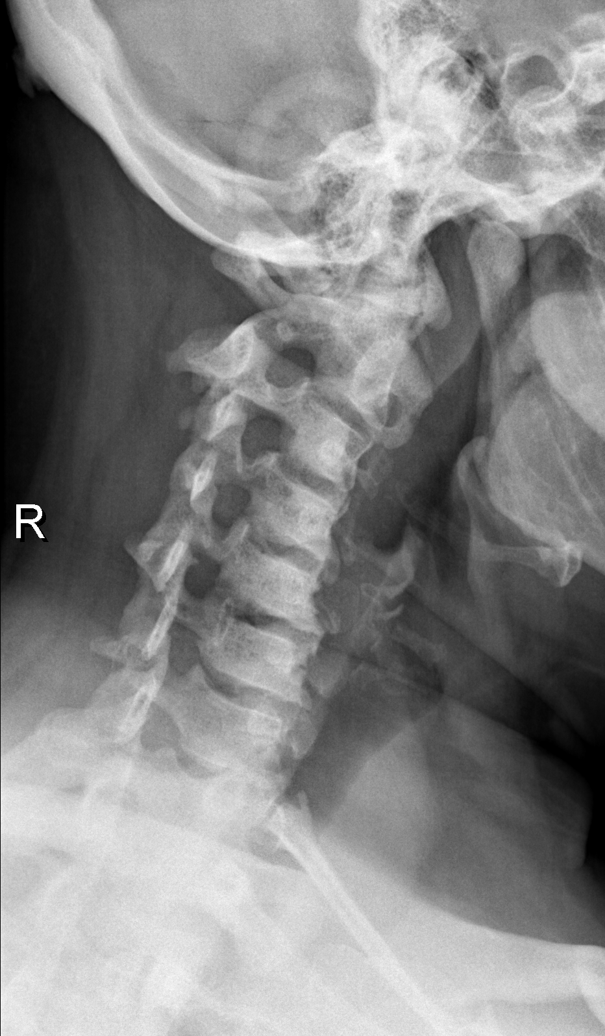

[w c-spine oblique (2 of 2)]
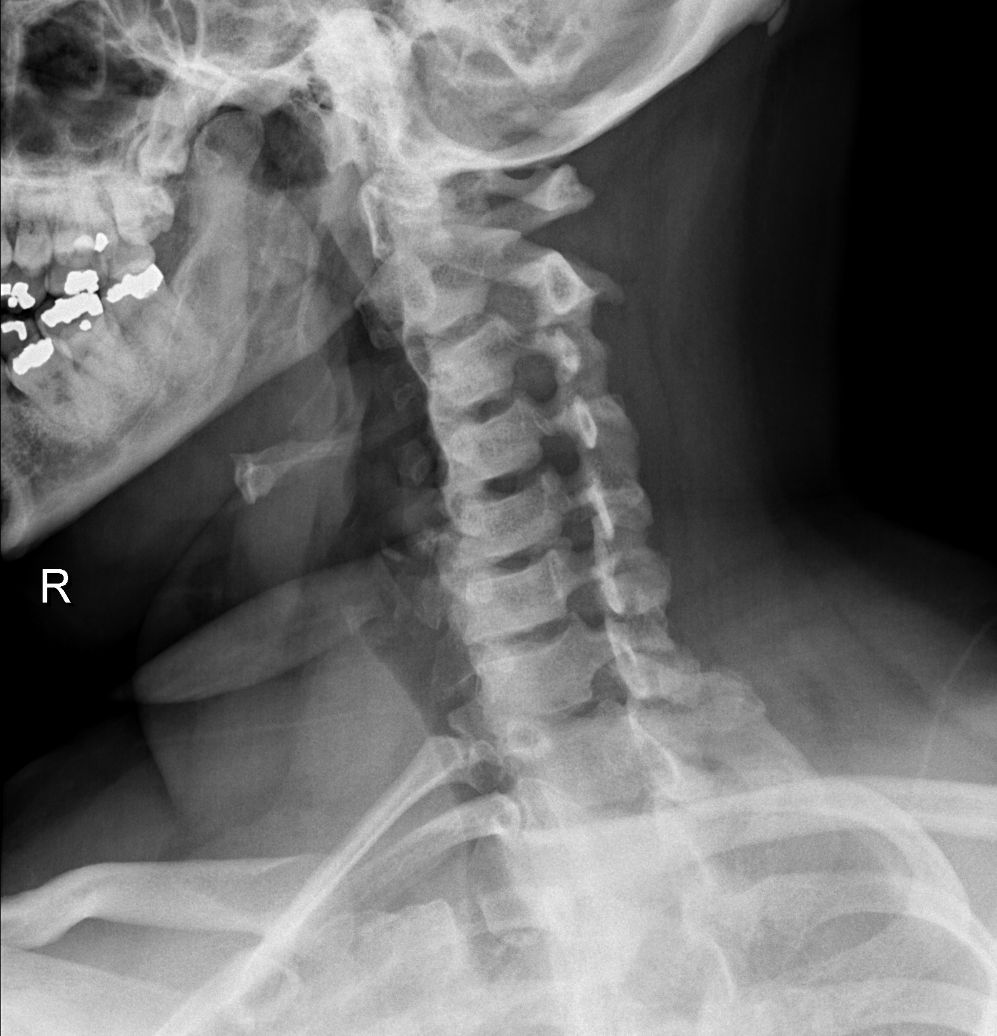

[w c-spine a.p.]
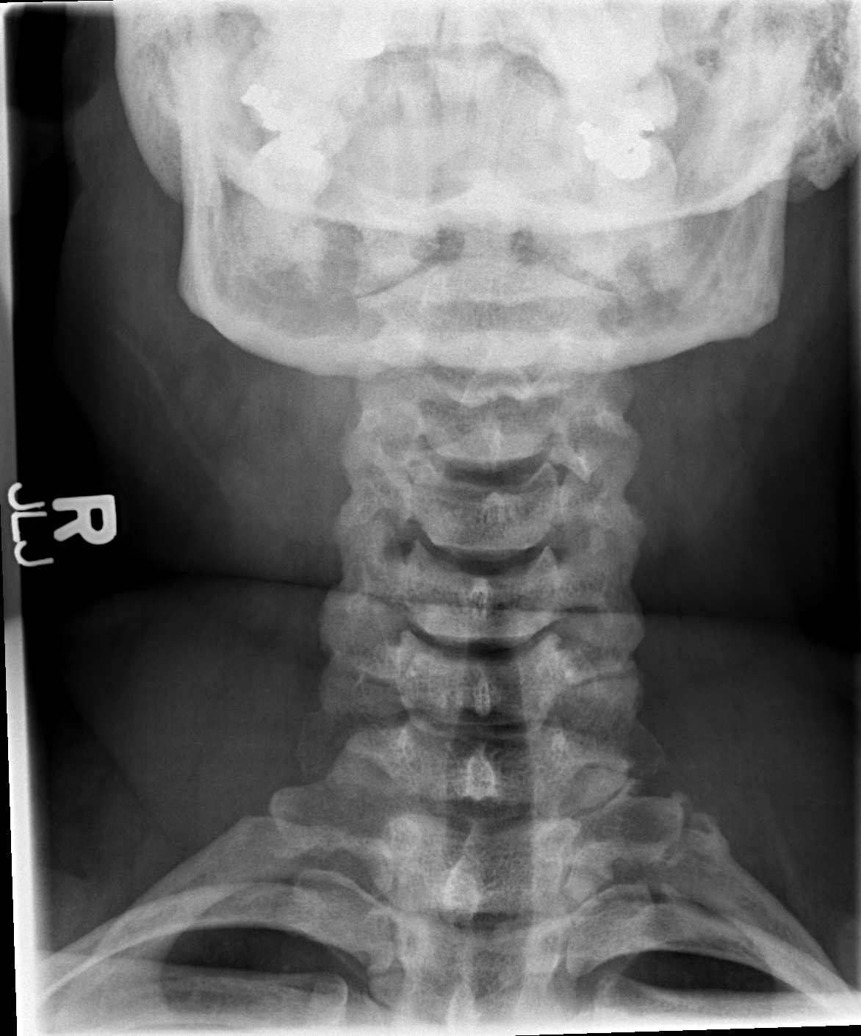

[w c-spine odontoid (1 of 2)]
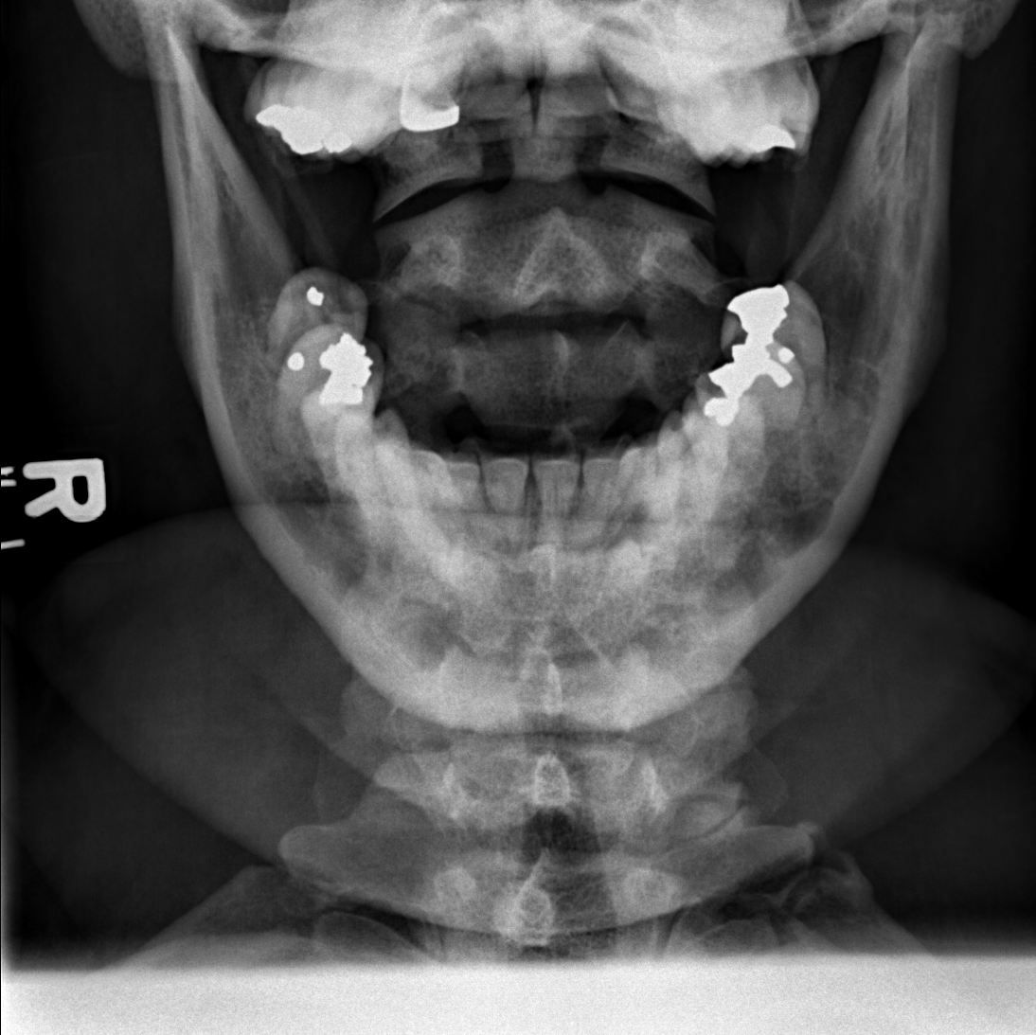

[w c-spine odontoid (2 of 2)]
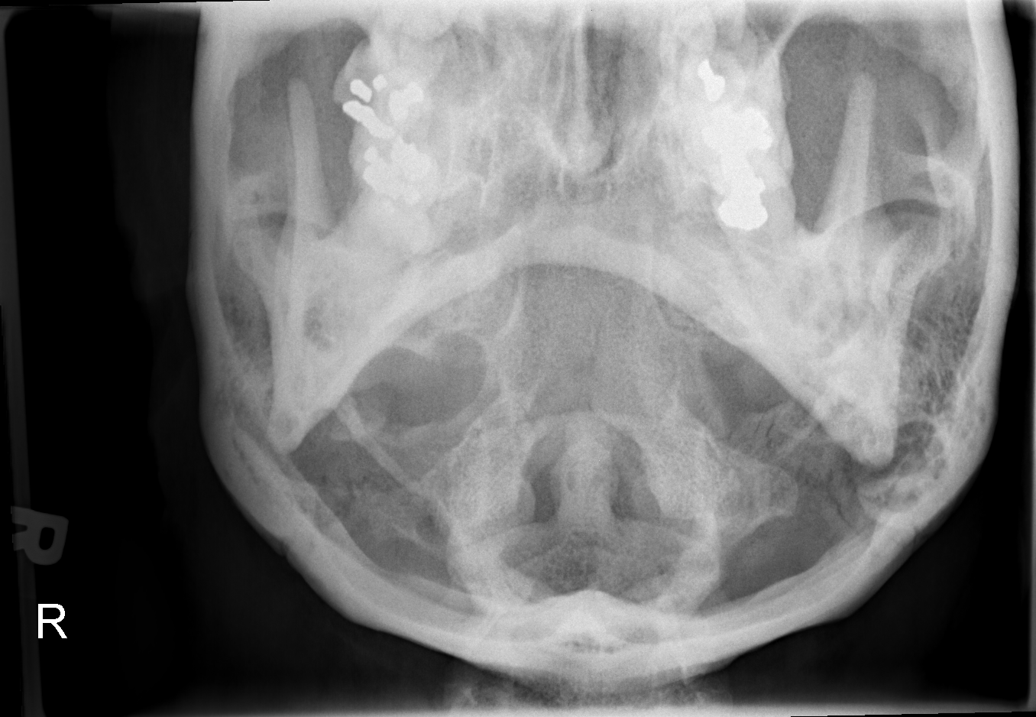

[6 of 6 positions shown; findings below may reference images not displayed]

FINDINGS: There is reversal of the normal cervical lordosis.  Disc height is well maintained.  No significant foraminal narrowing.  No fractures or subluxation.  Prevertebral soft tissues unremarkable.  There is calcification of the anterior longitudinal ligament at several levels.  There also may be a bone spur emanating off of C-4.
IMPRESSION: Reversal of the normal cervical lordosis, and mild degenerative changes ? no acute findings.

## 2008-10-16 ENCOUNTER — Ambulatory Visit: Payer: Self-pay | Admitting: Family Medicine

## 2008-10-16 LAB — CONVERTED CEMR LAB
BUN: 16 mg/dL (ref 6–23)
CO2: 28 meq/L (ref 19–32)
Calcium: 10.3 mg/dL (ref 8.4–10.5)
Chloride: 101 meq/L (ref 96–112)
Creatinine, Ser: 0.86 mg/dL (ref 0.40–1.20)
Glucose, Bld: 110 mg/dL — ABNORMAL HIGH (ref 70–99)
Potassium: 3.8 meq/L (ref 3.5–5.3)
Sodium: 142 meq/L (ref 135–145)
Vit D, 25-Hydroxy: 28 ng/mL — ABNORMAL LOW (ref 30–89)

## 2008-10-30 ENCOUNTER — Ambulatory Visit (HOSPITAL_COMMUNITY): Admission: RE | Admit: 2008-10-30 | Discharge: 2008-10-30 | Payer: Self-pay | Admitting: Family Medicine

## 2008-11-11 IMAGING — CR DG CHEST 2V
2 series · 2 of 2 positions shown · non-contrast
Comparison: 10/27/2006.

CLINICAL DATA: Ex-smoker with chest pain and shortness of breath.

CHEST - 2 VIEW

[w chest pa]
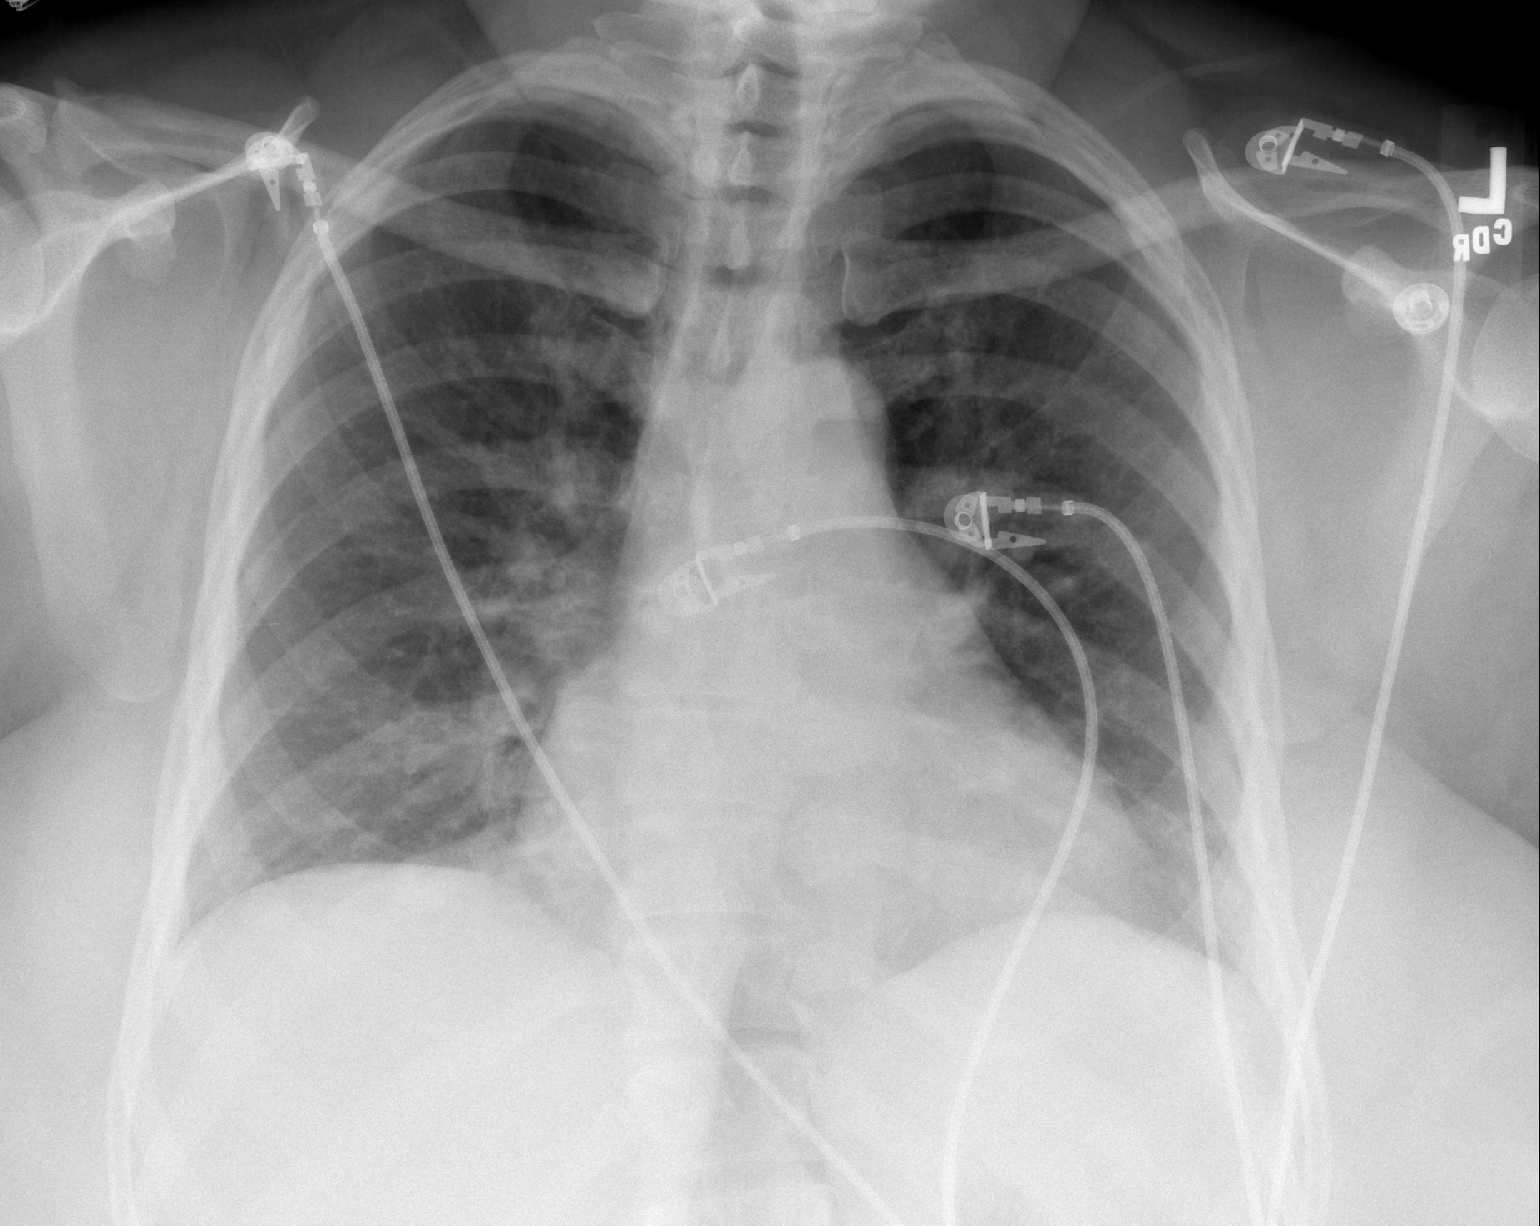

[w chest lat]
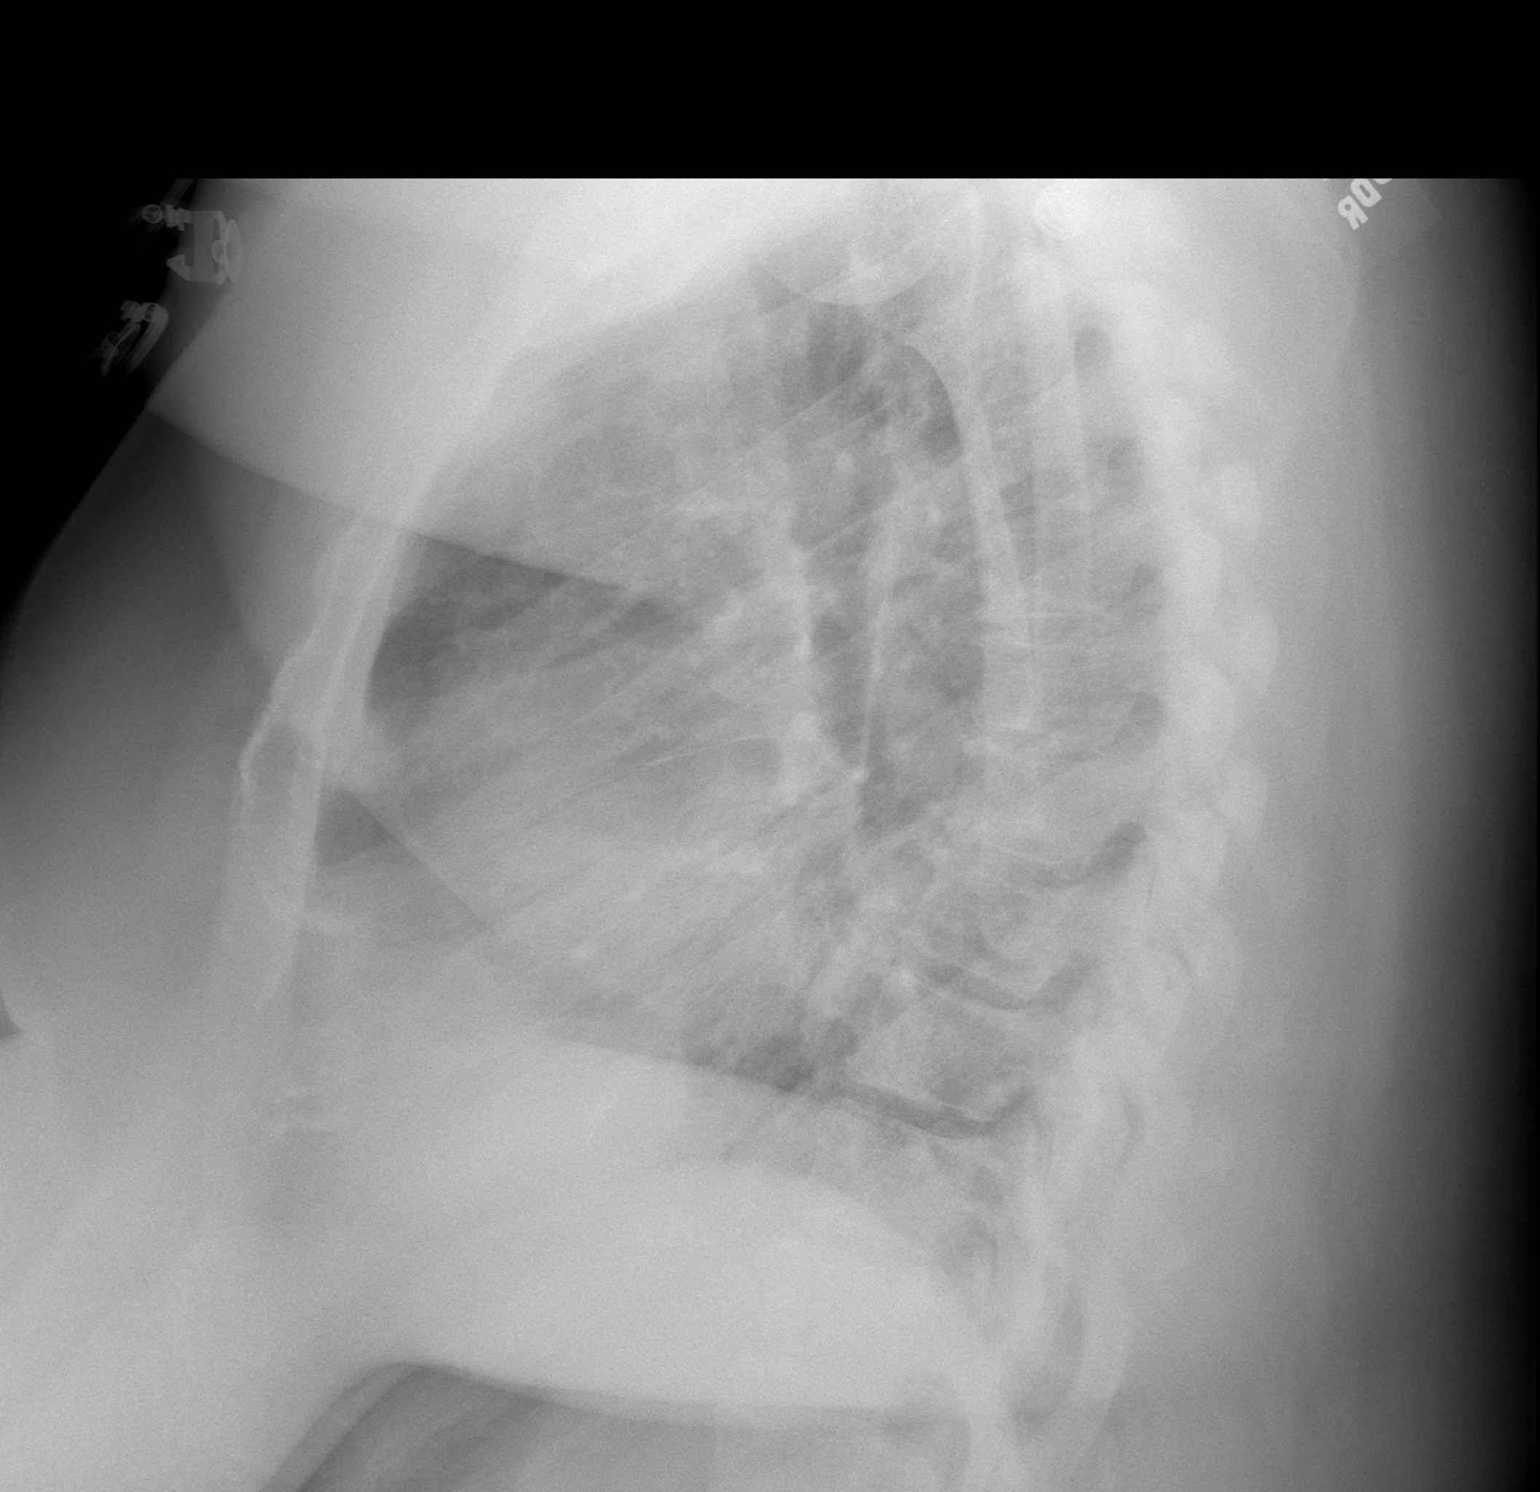

[2 of 2 positions shown; findings below may reference images not displayed]

FINDINGS: Stable mildly enlarged cardiac silhouette. Prominent pulmonary
vasculature, improved. Mild thoracic spine degenerative changes and mild
scoliosis.

IMPRESSION

1. Stable mild cardiomegaly.
2. Pulmonary vascular congestion, improved.

## 2008-11-11 IMAGING — CT CT ANGIO CHEST
2 of 6 series · 19 of 36 positions shown · IV contrast (APPLIED)
Comparison: Routine CT of the chest 11/01/06, and CT angio of the chest 05/03/06.

CLINICAL DATA: Chest pain with shortness of breath/history of pulmonary emboli in the past.   
CT ANGIOGRAPHY OF CHEST:
TECHNIQUE: Multidetector CT imaging of the chest was performed during bolus injection of intravenous contrast.  Multiplanar CT angiographic image reconstructions were generated to evaluate the vascular anatomy.
Contrast:  80 cc Omnipaque 300.

[Series 8: pulm embolism 1.0 b25f thins · axial · 0.60mm/px · z∈[+1159,+1351]mm · 17 of 217 slices shown]
[im 13/217  lung]
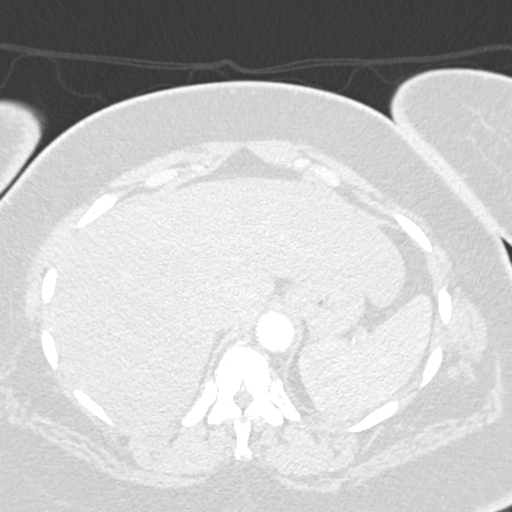
[im 25/217  mediastinal]
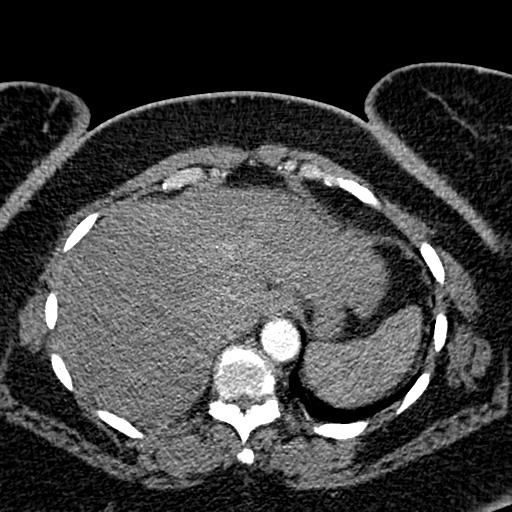
[im 37/217  lung]
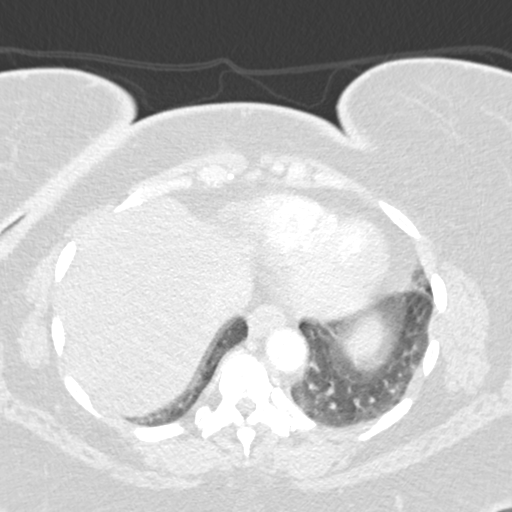
[im 49/217  mediastinal]
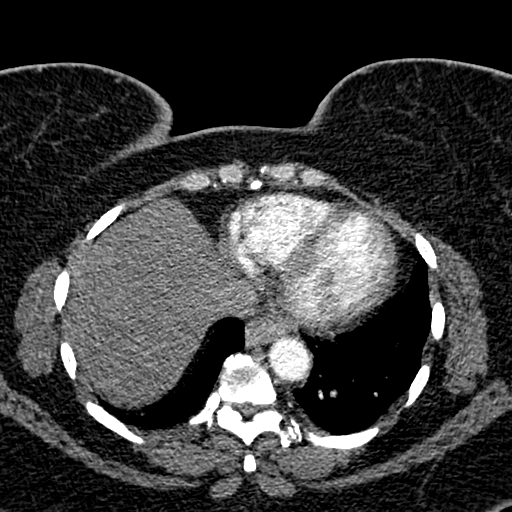
[im 61/217  lung]
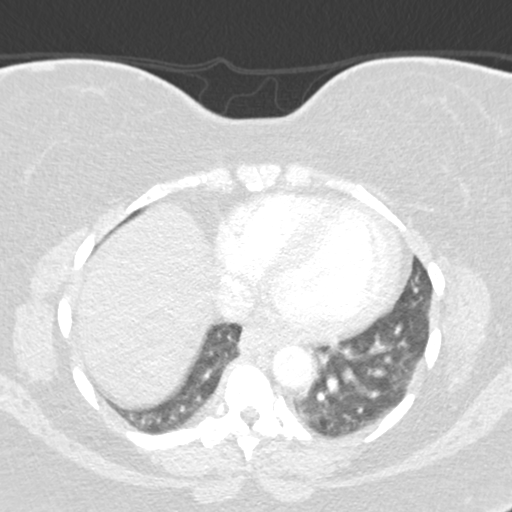
[im 73/217  mediastinal]
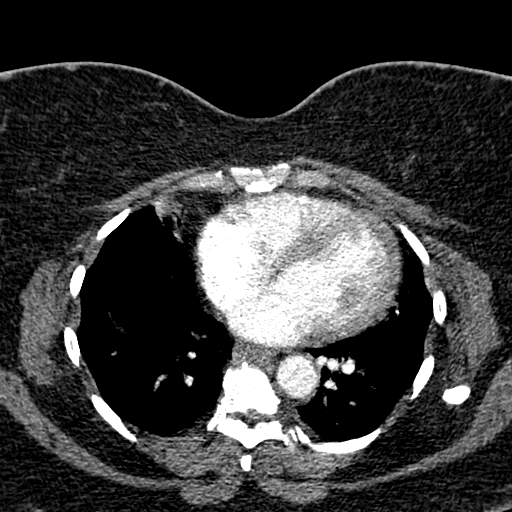
[im 85/217  lung]
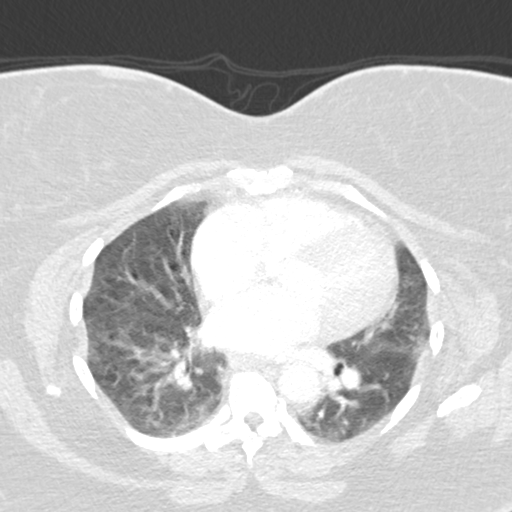
[im 97/217  mediastinal]
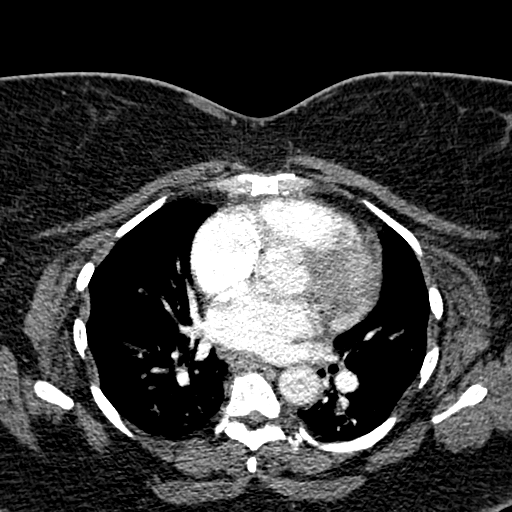
[im 109/217  lung]
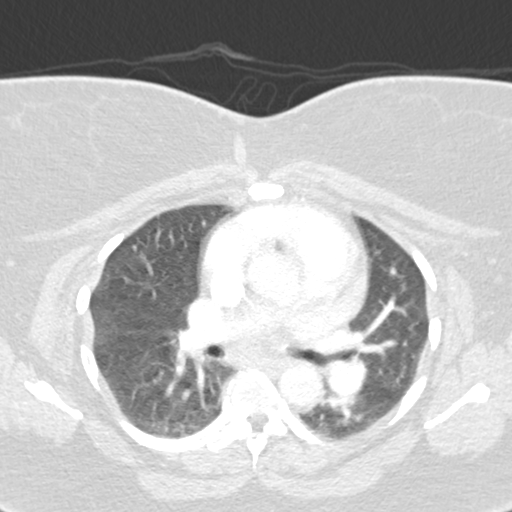
[im 121/217  mediastinal]
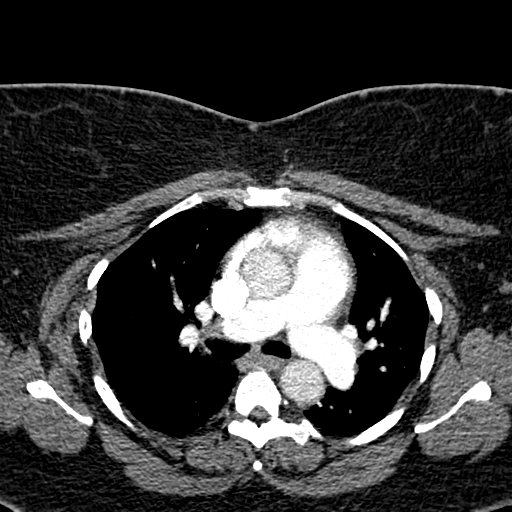
[im 133/217  lung]
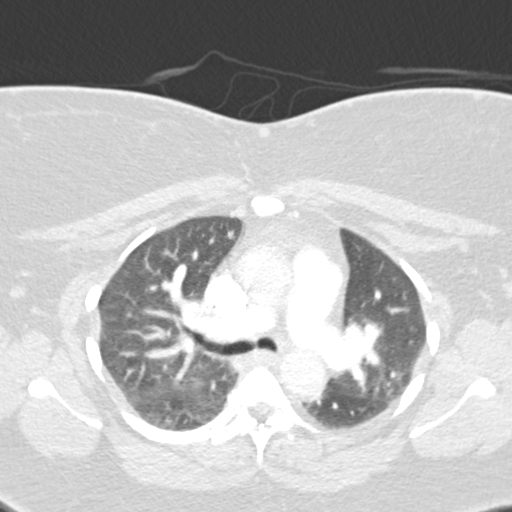
[im 145/217  mediastinal]
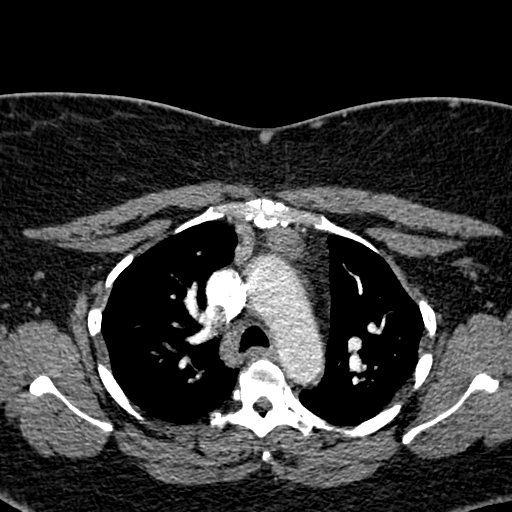
[im 157/217  lung]
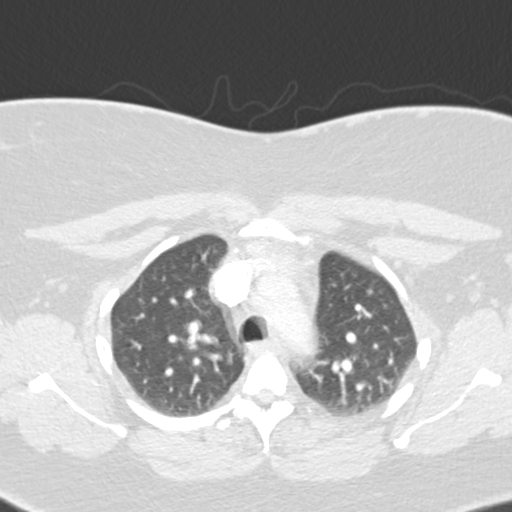
[im 169/217  mediastinal]
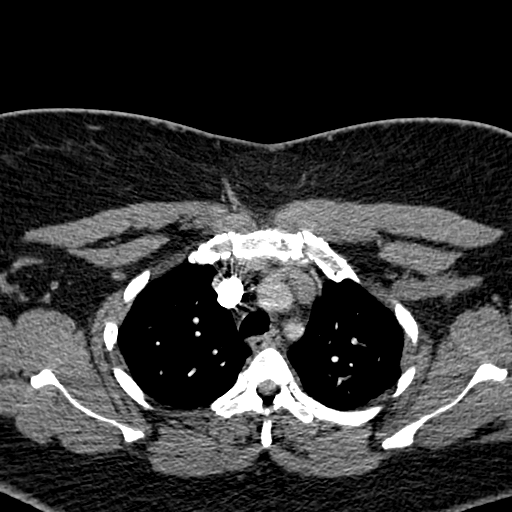
[im 181/217  lung]
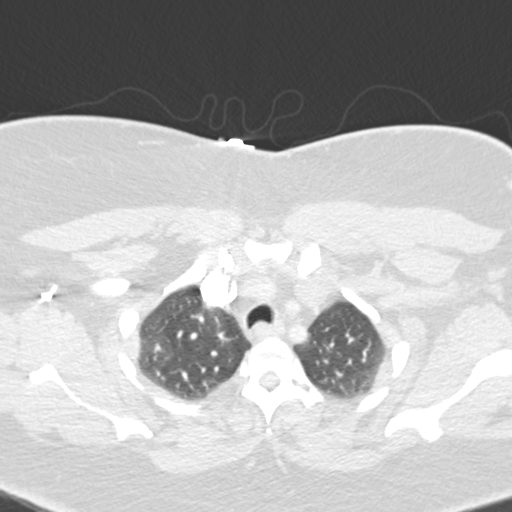
[im 193/217  mediastinal]
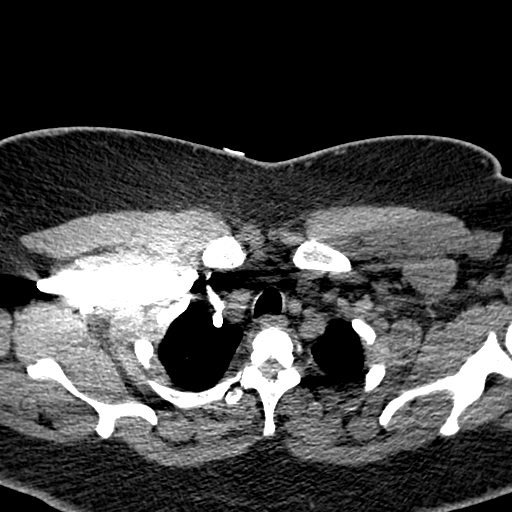
[im 205/217  lung]
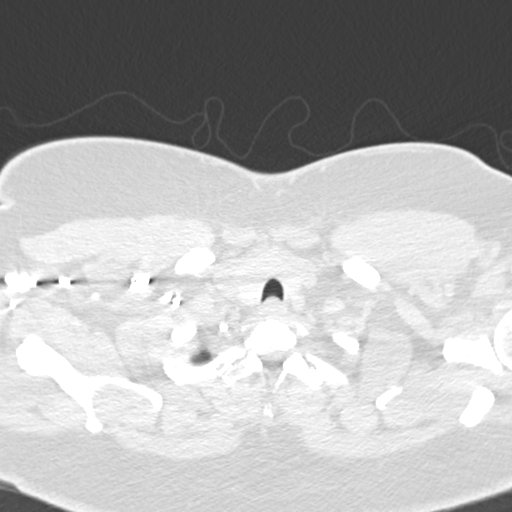

[Series 603: cor chest · coronal · 0.60mm/px · 2 of 119 slices shown]
[im 40/119  mediastinal]
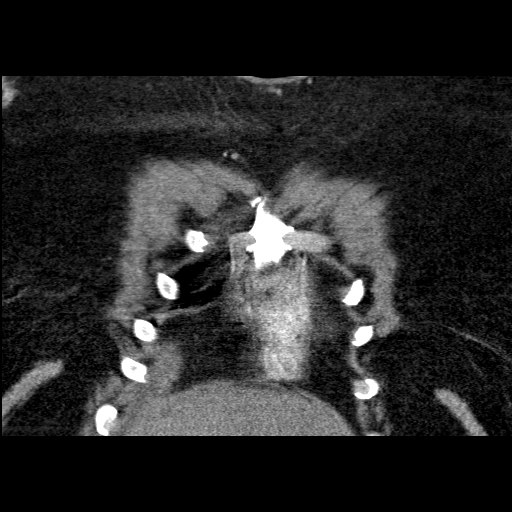
[im 79/119  mediastinal]
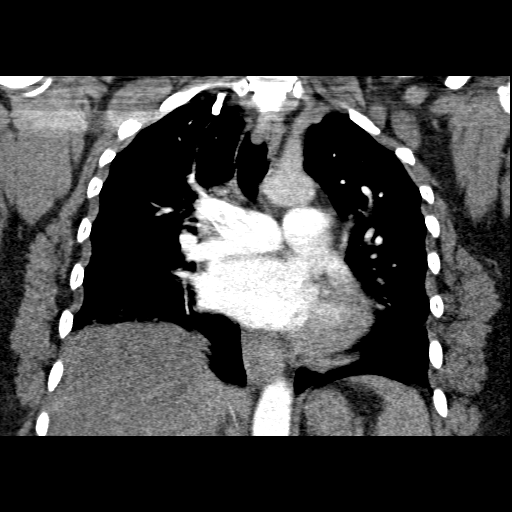

[19 of 36 positions shown; findings below may reference images not displayed]

FINDINGS: There are no definite acute pulmonary emboli.   There is some breathing motion artifact stored in the lower lobe vessels, especially on the right.  In addition, the patient is of large body habitus.  However, I see no findings that would suggest significant pulmonary emboli.  No active appearing lung infiltrates or masses.  Anterior mediastinal mass stable at about 23 x 17 mm.  There may be a second mediastinal nodule more superiorly behind the manubrium but it is not as well visualized today because of streak artifact emanating off the SVC.  Other adenopathy.  No chest wall lesions or masses.   No new findings.
IMPRESSION: 1.  The study is limited technically by breathing motion artifact and the patient?s body habitus.   However, there are no  pulmonary emboli.
2.  Lungs clear. 
3.  Stable anterior mediastinal mass. 
4.  No new findings.

## 2008-12-03 ENCOUNTER — Ambulatory Visit: Payer: Self-pay | Admitting: Family Medicine

## 2008-12-03 LAB — CONVERTED CEMR LAB: Microalb, Ur: 10.03 mg/dL — ABNORMAL HIGH (ref 0.00–1.89)

## 2009-01-11 ENCOUNTER — Ambulatory Visit: Payer: Self-pay | Admitting: Family Medicine

## 2009-01-11 ENCOUNTER — Ambulatory Visit (HOSPITAL_COMMUNITY): Admission: RE | Admit: 2009-01-11 | Discharge: 2009-01-11 | Payer: Self-pay | Admitting: Internal Medicine

## 2009-01-17 IMAGING — RF DG UGI W/ HIGH DENSITY W/KUB
15 of 24 series · 15 of 24 positions shown · IV contrast (agent unspecified)
Comparison: none

CLINICAL DATA: KUB UPPER GI WITH HIGH DENSITY:
KUB:
A preliminary film of the abdomen shows a nonspecific bowel gas pattern.  No opaque calculi are noted.  
UPPER GI DOUBLE CONTRAST:
Double contrast upper GI was performed.  The mucosa of the esophagus appears normal.  A single contrast study shows the swallowing mechanism to be normal.  The stomach is normal in contour and peristalsis.  The duodenal bulb fills well with no ulceration and the duodenal loop is in normal position.  Minimal reflux is noted with the water siphon maneuver.

[Series 1: t abdomen supine · 1 of 1 slices shown]
[im 1/1]
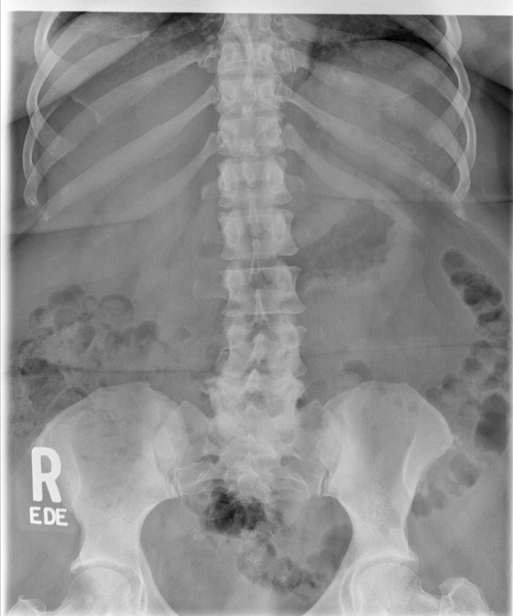

[Series 2: run · 1 of 1 slices shown (1 of 14)]
[im 1/1]
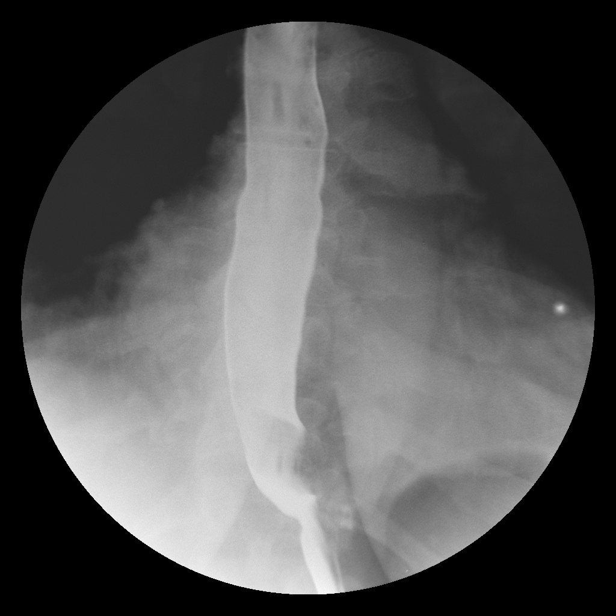

[Series 4: run · 1 of 1 slices shown (2 of 14)]
[im 1/1]
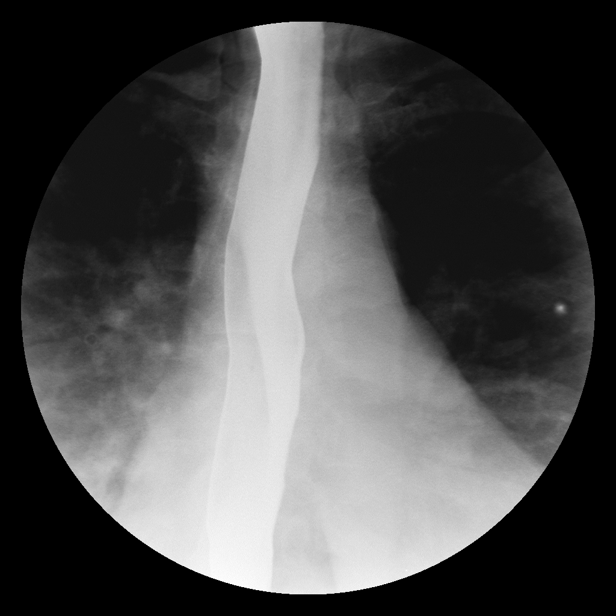

[Series 5: run · 1 of 1 slices shown (3 of 14)]
[im 1/1]
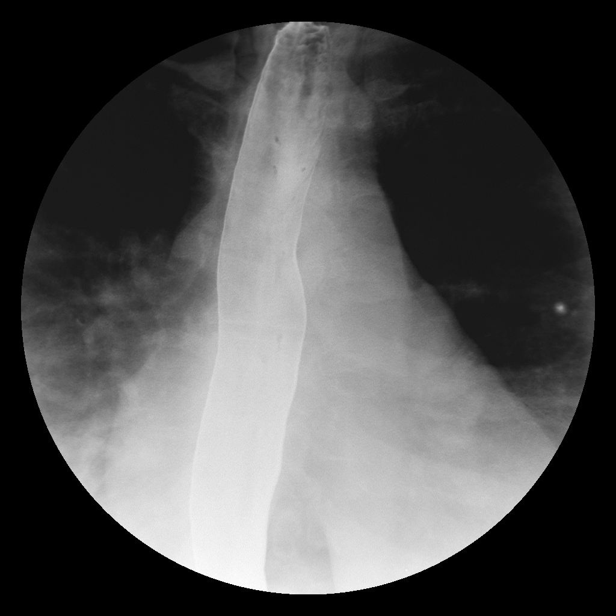

[Series 7: run · 1 of 1 slices shown (4 of 14)]
[im 1/1]
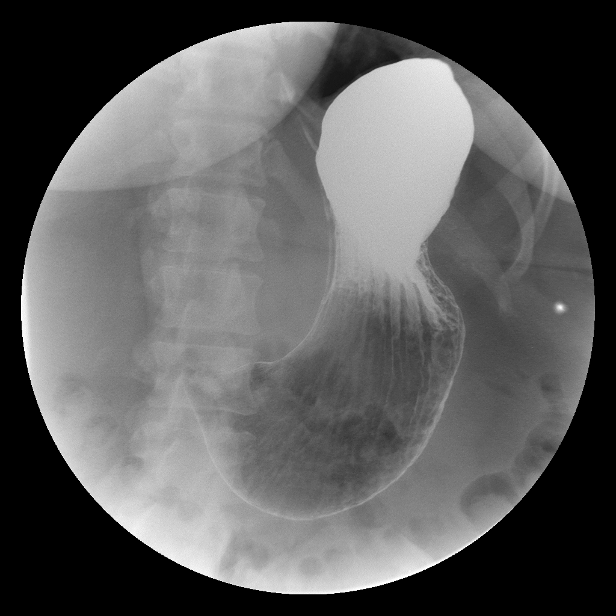

[Series 8: run · 1 of 1 slices shown (5 of 14)]
[im 1/1]
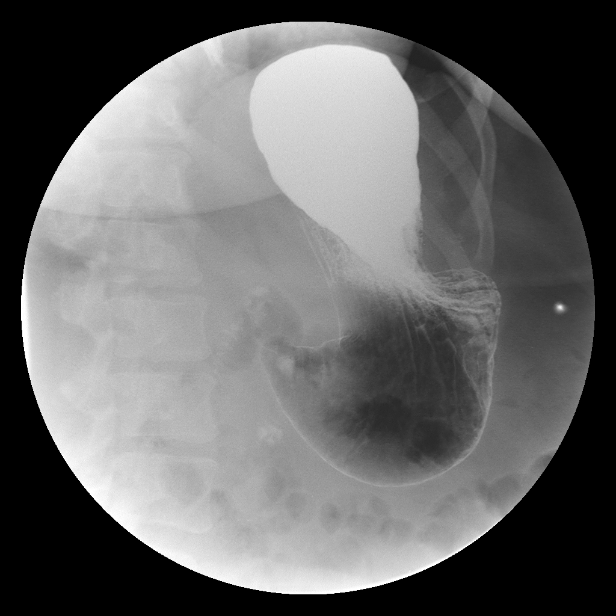

[Series 10: run · 1 of 1 slices shown (6 of 14)]
[im 1/1]
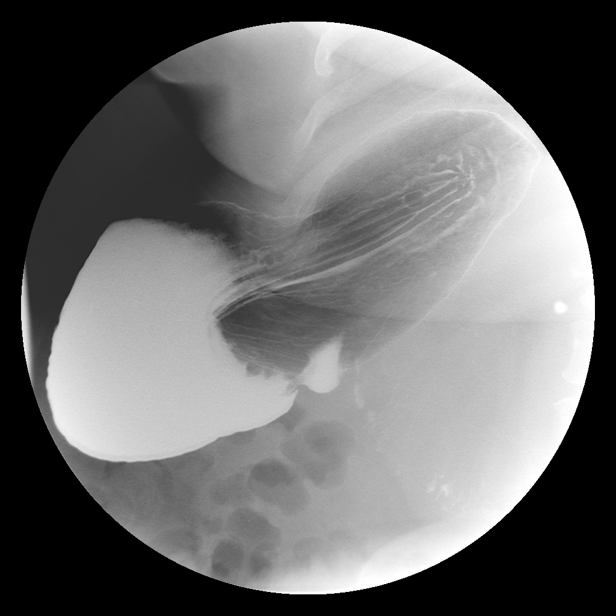

[Series 12: run · 1 of 1 slices shown (7 of 14)]
[im 1/1]
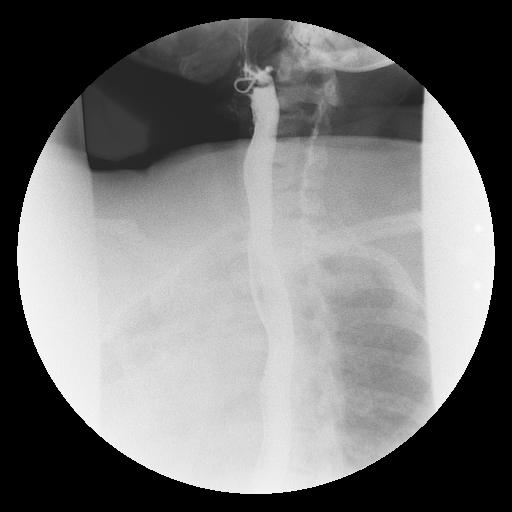

[Series 13: run · 1 of 1 slices shown (8 of 14)]
[im 1/1]
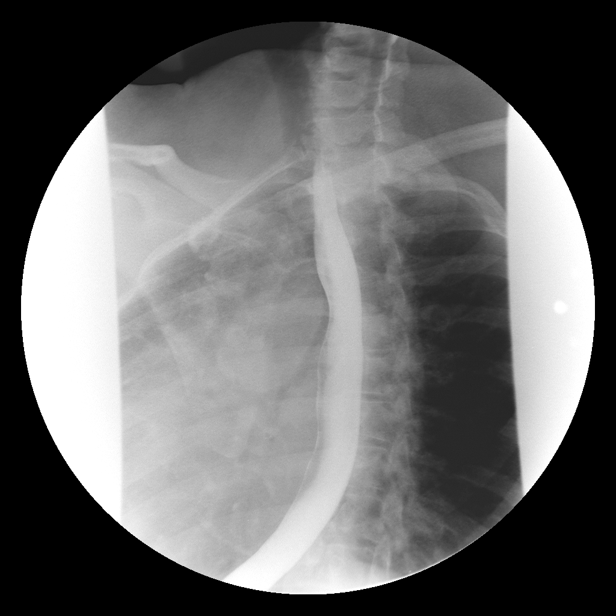

[Series 15: run · 1 of 1 slices shown (9 of 14)]
[im 1/1]
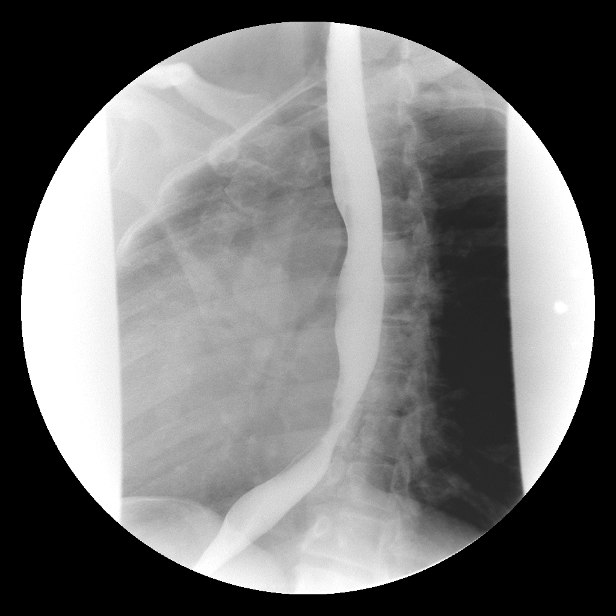

[Series 16: run · 1 of 1 slices shown (10 of 14)]
[im 1/1]
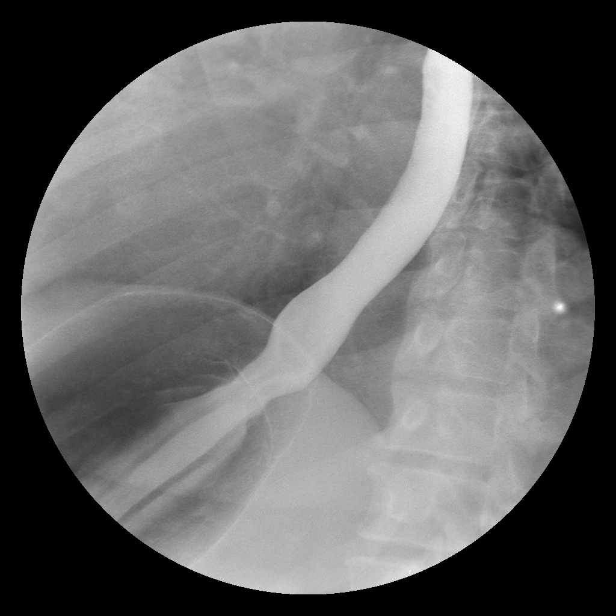

[Series 18: run · 1 of 1 slices shown (11 of 14)]
[im 1/1]
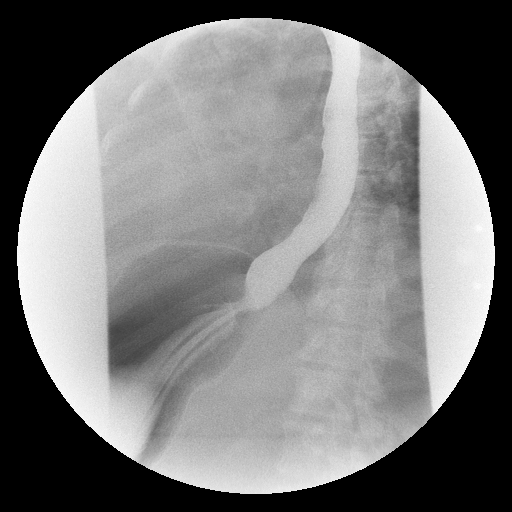

[Series 20: run · 1 of 1 slices shown (12 of 14)]
[im 1/1]
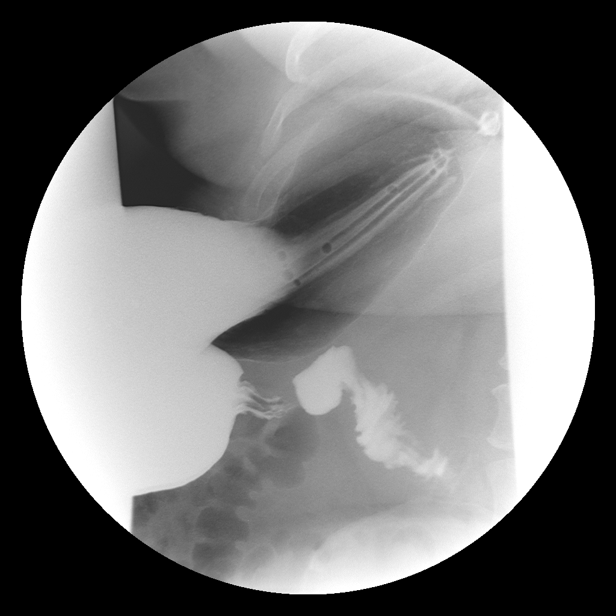

[Series 21: run · 1 of 1 slices shown (13 of 14)]
[im 1/1]
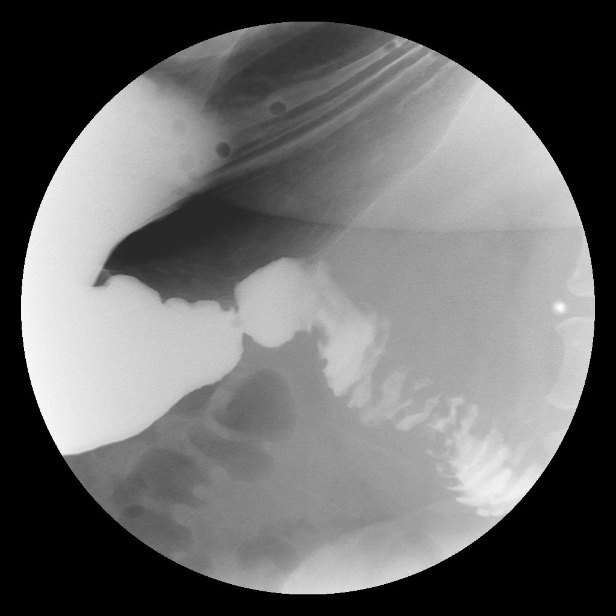

[Series 23: run · 1 of 1 slices shown (14 of 14)]
[im 1/1]
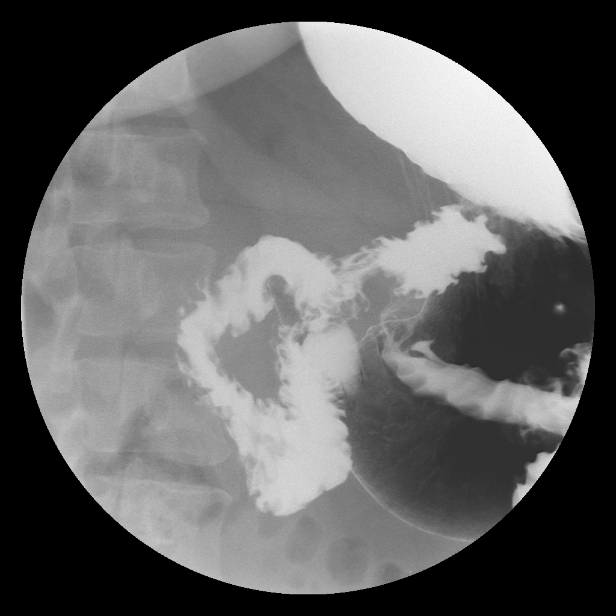

[15 of 24 positions shown; findings below may reference images not displayed]

IMPRESSION: Negative upper GI.  Only minimal reflux is noted with the water siphon maneuver.

## 2009-02-06 IMAGING — CR DG CHEST 1V PORT
1 series · 1 of 1 positions shown · non-contrast
Comparison: 01/07/07.

CLINICAL DATA: Chest pain, shortness of breath. 
 PORTABLE CHEST - 1 VIEW:

[AP]
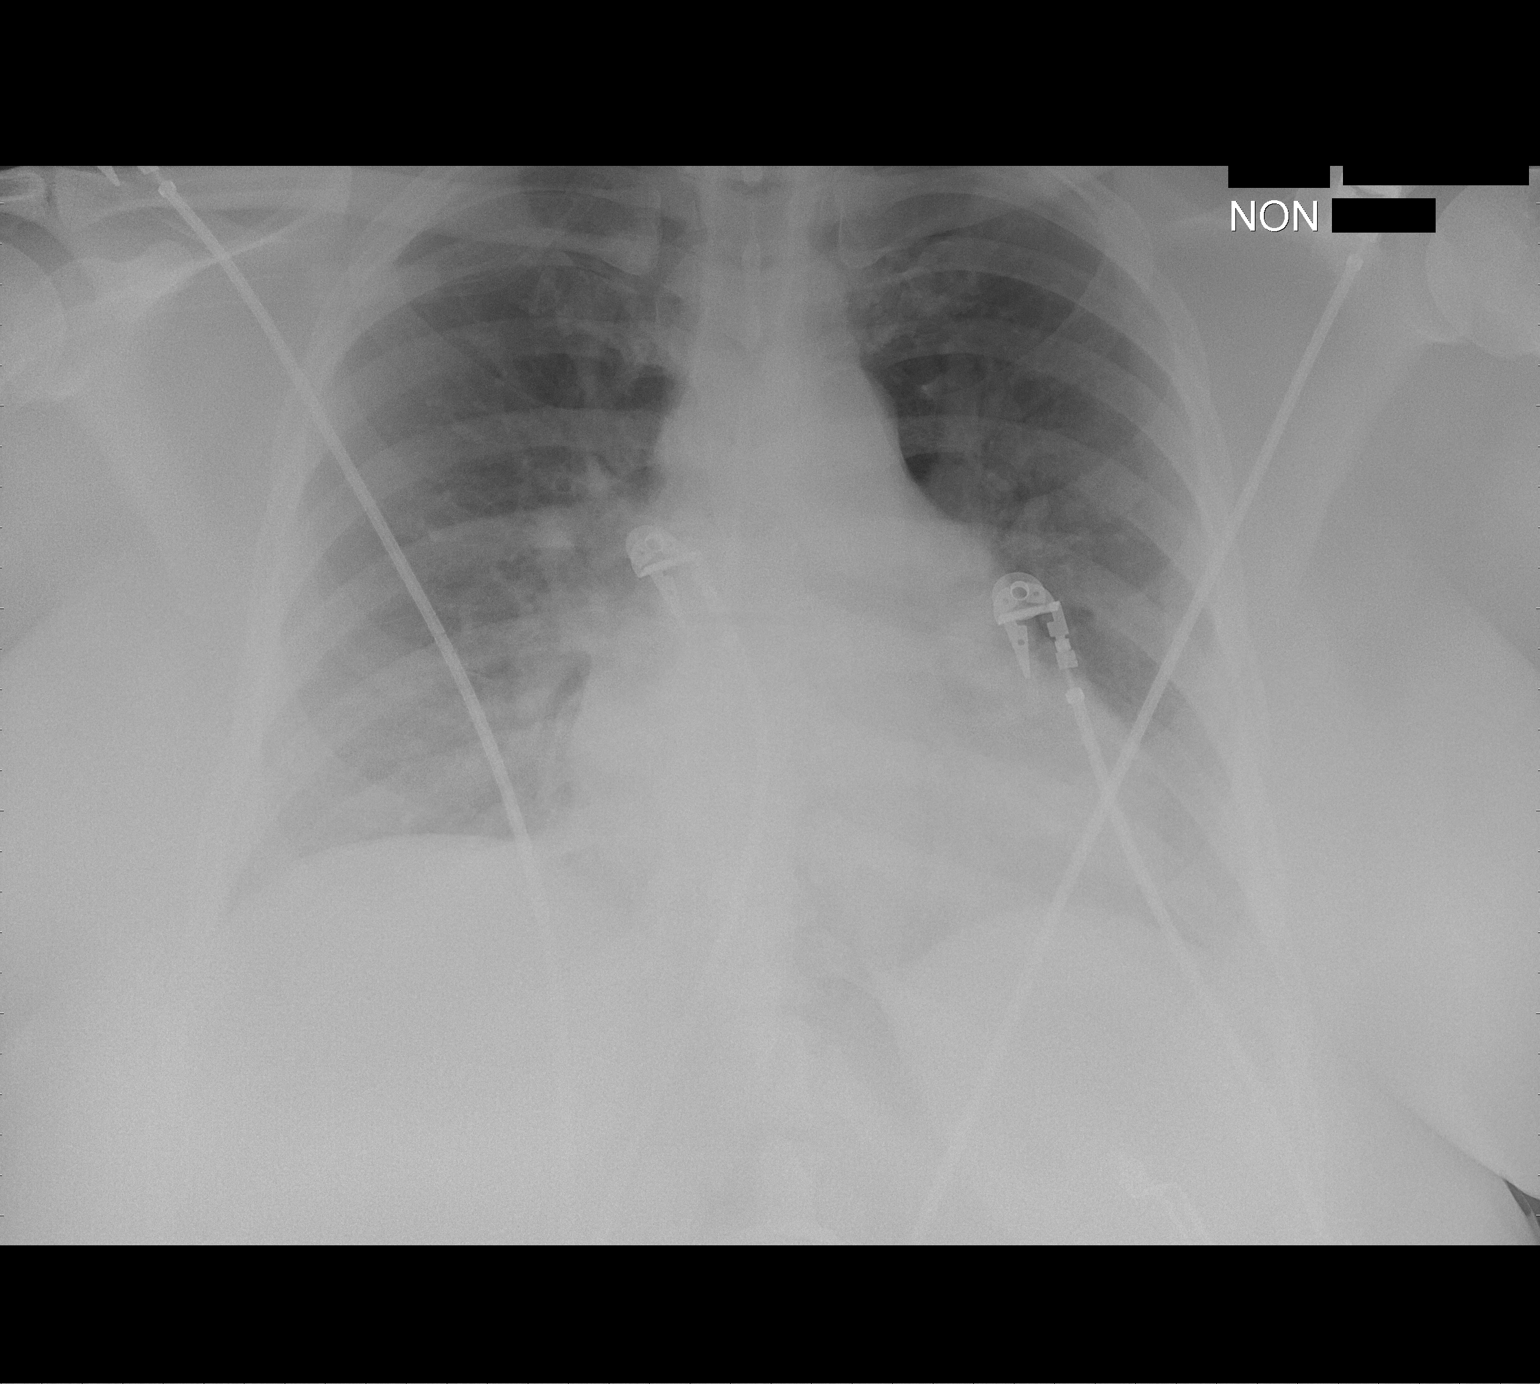

[1 of 1 positions shown; findings below may reference images not displayed]

FINDINGS: Lungs are clear.  Heart size is upper normal.  No effusion or focal bony abnormality.  The patient's known anterior mediastinal lesion is not visible on this study.
IMPRESSION: No acute disease.

## 2009-02-06 IMAGING — CT CT ANGIO CHEST
2 of 5 series · 19 of 36 positions shown · IV contrast (APPLIED)
Comparison: 01/07/07 and 11/01/06 chest CTs.

CLINICAL DATA: Chest pain and shortness of breath. History of pulmonary embolus.
CT ANGIOGRAPHY OF CHEST:
TECHNIQUE: Multidetector CT imaging of the chest was performed during bolus injection of intravenous contrast.  Multiplanar CT angiographic image reconstructions were generated to evaluate the vascular anatomy.
Contrast:  85 cc of Omnipaque 350.

[Series 8: pulm embolism 1.0 b25f thins · axial · 0.54mm/px · z∈[-236,-12]mm · 16 of 249 slices shown]
[im 13/249  lung]
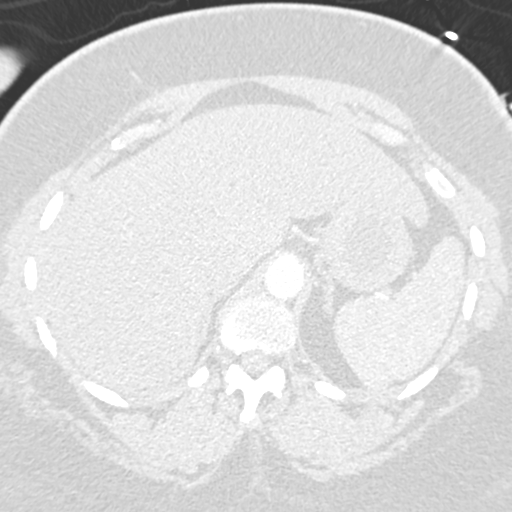
[im 25/249  mediastinal]
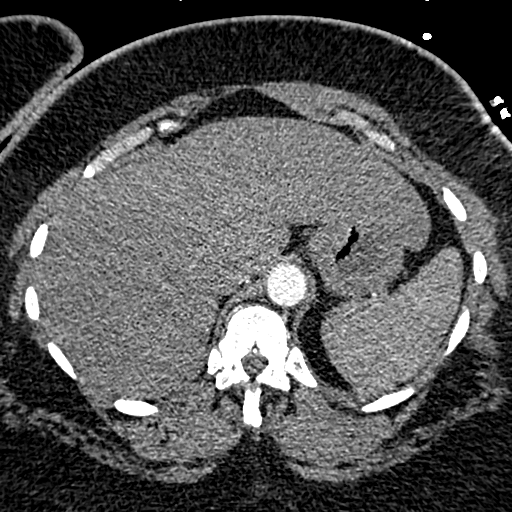
[im 38/249  lung]
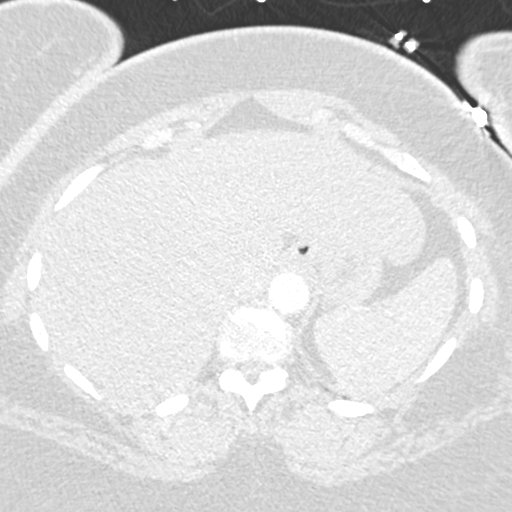
[im 63/249  mediastinal]
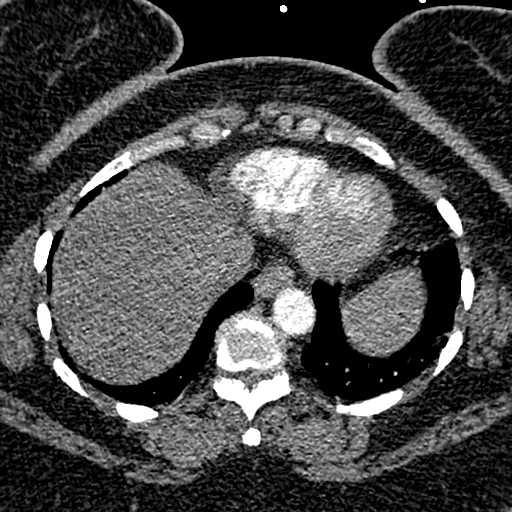
[im 75/249  lung]
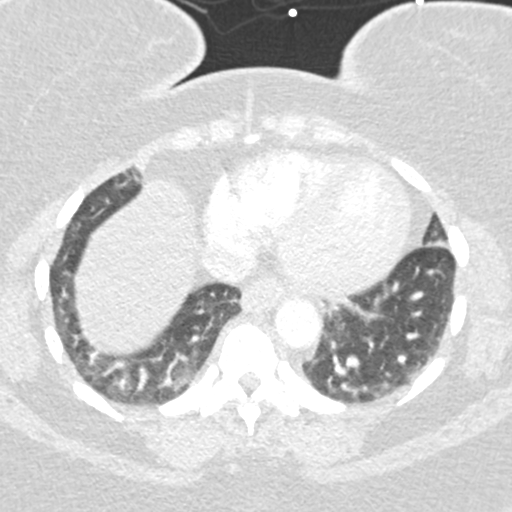
[im 87/249  mediastinal]
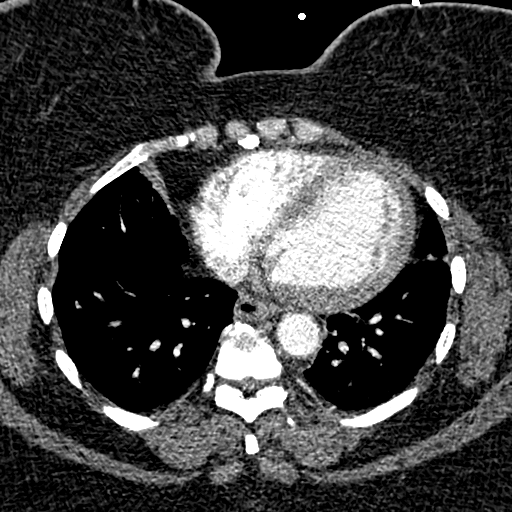
[im 100/249  lung]
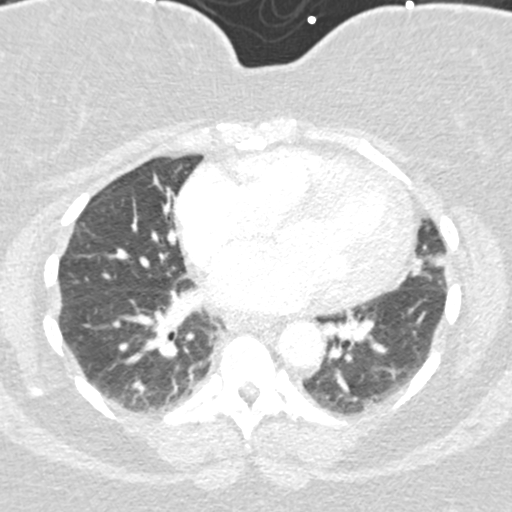
[im 112/249  mediastinal]
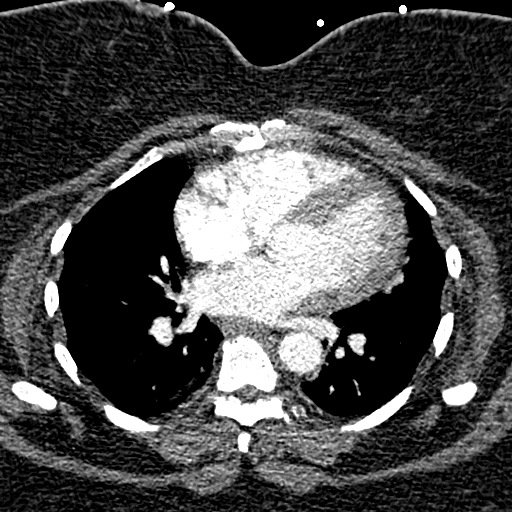
[im 137/249  lung]
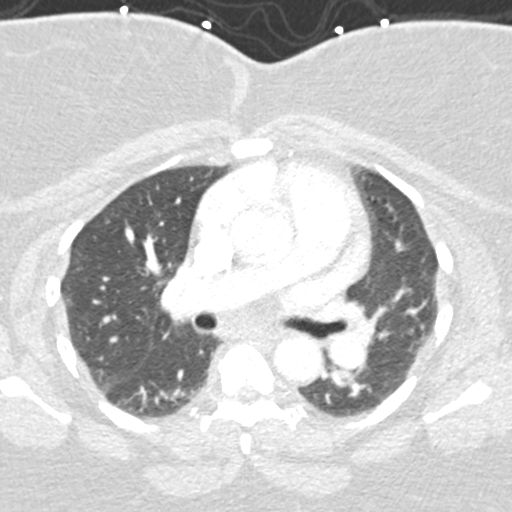
[im 149/249  mediastinal]
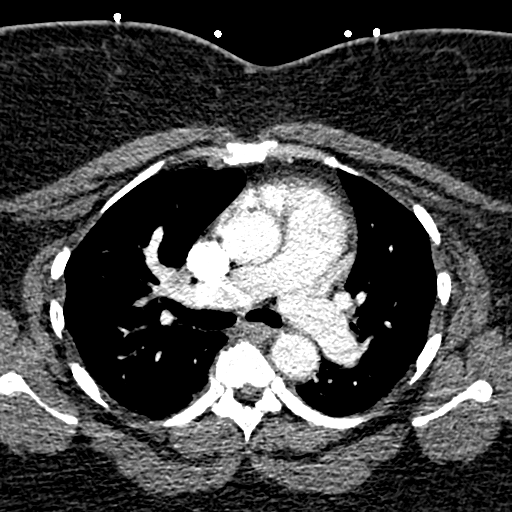
[im 162/249  lung]
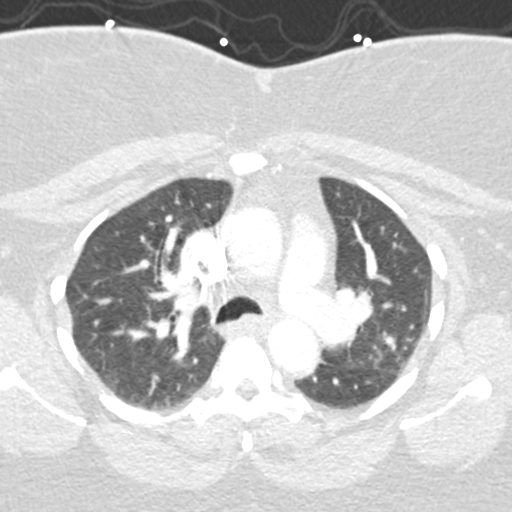
[im 174/249  mediastinal]
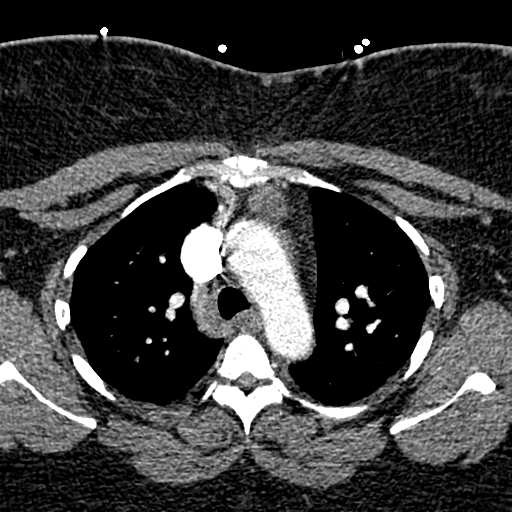
[im 187/249  lung]
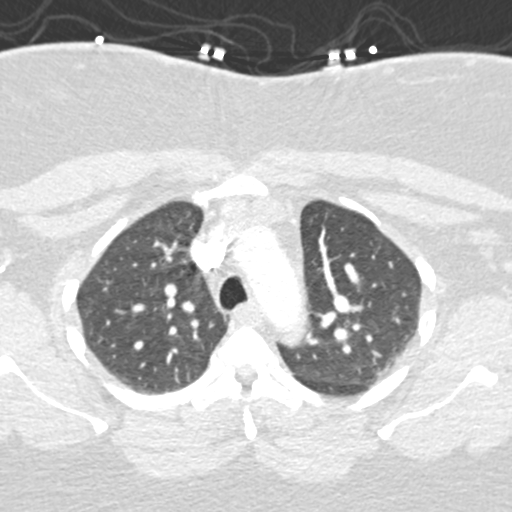
[im 211/249  mediastinal]
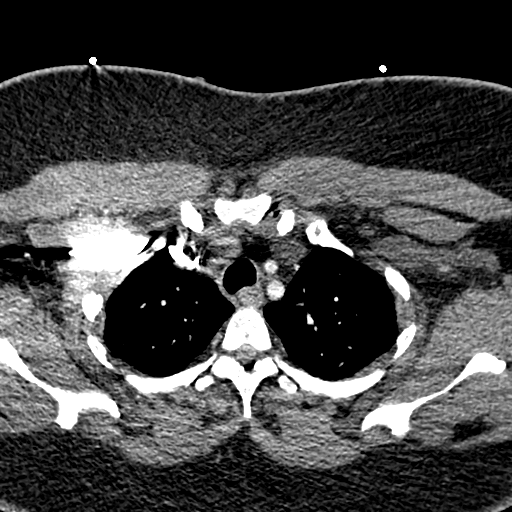
[im 224/249  lung]
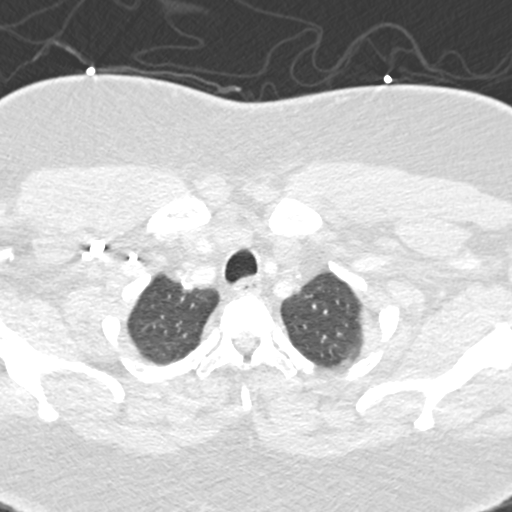
[im 236/249  mediastinal]
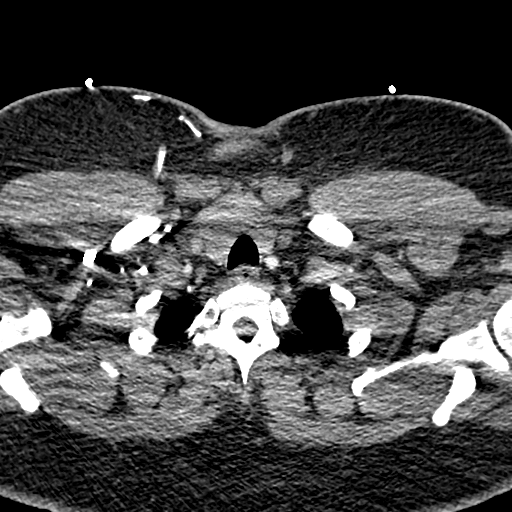

[Series 603: cor · coronal · 0.54mm/px · 3 of 113 slices shown]
[im 23/113  mediastinal]
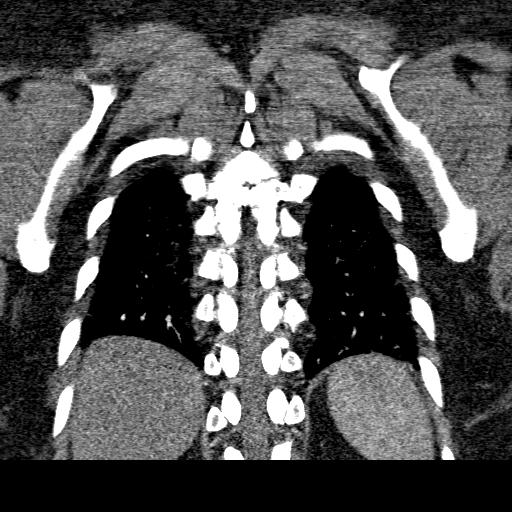
[im 45/113  mediastinal]
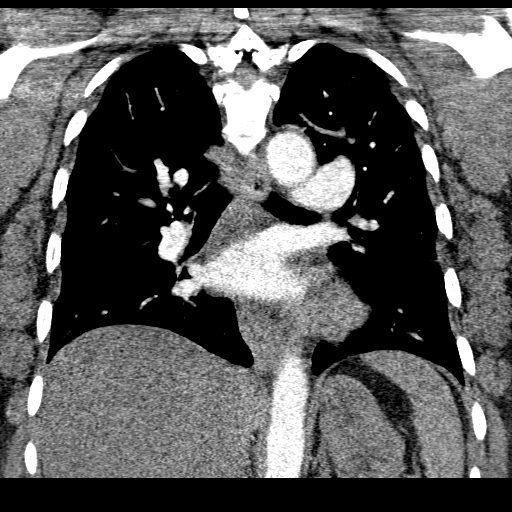
[im 68/113  mediastinal]
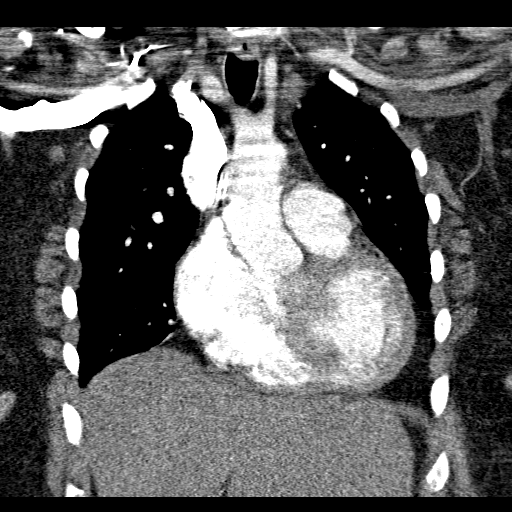

[19 of 36 positions shown; findings below may reference images not displayed]

FINDINGS: There is no CT evidence of pulmonary embolus. No pleural or pericardial effusion. Mediastinal mass is again seen and unchanged measuring approximately 2.6 x 1.8 cm.  No hilar or axillary lymphadenopathy is identified. The heart is enlarged. The lungs demonstrate some dependent atelectatic change, but are otherwise unremarkable. The airway appears normal. Incidentally upper abdomen shows fatty infiltration of the liver, but is otherwise unremarkable. No focal bony abnormality.
IMPRESSION: 1.  Negative for pulmonary embolus.
2.  Unchanged anterior mediastinal mass.

## 2009-03-05 ENCOUNTER — Encounter: Admission: RE | Admit: 2009-03-05 | Discharge: 2009-03-05 | Payer: Self-pay | Admitting: Internal Medicine

## 2009-03-21 ENCOUNTER — Emergency Department (HOSPITAL_COMMUNITY): Admission: EM | Admit: 2009-03-21 | Discharge: 2009-03-21 | Payer: Self-pay | Admitting: Emergency Medicine

## 2009-05-08 ENCOUNTER — Emergency Department (HOSPITAL_COMMUNITY): Admission: EM | Admit: 2009-05-08 | Discharge: 2009-05-08 | Payer: Self-pay | Admitting: Emergency Medicine

## 2009-05-11 ENCOUNTER — Ambulatory Visit: Payer: Self-pay | Admitting: Family Medicine

## 2009-05-11 LAB — CONVERTED CEMR LAB
ALT: 19 units/L (ref 0–35)
AST: 13 units/L (ref 0–37)
Albumin: 4.2 g/dL (ref 3.5–5.2)
Alkaline Phosphatase: 55 units/L (ref 39–117)
BUN: 11 mg/dL (ref 6–23)
CO2: 29 meq/L (ref 19–32)
Calcium: 9.1 mg/dL (ref 8.4–10.5)
Chloride: 100 meq/L (ref 96–112)
Cholesterol: 156 mg/dL (ref 0–200)
Creatinine, Ser: 0.84 mg/dL (ref 0.40–1.20)
Glucose, Bld: 142 mg/dL — ABNORMAL HIGH (ref 70–99)
HDL: 47 mg/dL (ref >39)
LDL Cholesterol: 64 mg/dL (ref 0–99)
Potassium: 4.2 meq/L (ref 3.5–5.3)
Sodium: 137 meq/L (ref 135–145)
Total Bilirubin: 0.4 mg/dL (ref 0.3–1.2)
Total CHOL/HDL Ratio: 3.3
Total Protein: 7.3 g/dL (ref 6.0–8.3)
Triglycerides: 224 mg/dL — ABNORMAL HIGH (ref <150)
VLDL: 45 mg/dL — ABNORMAL HIGH (ref 0–40)
Vit D, 25-Hydroxy: 29 ng/mL — ABNORMAL LOW (ref 30–89)

## 2009-05-26 ENCOUNTER — Ambulatory Visit: Payer: Self-pay | Admitting: Family Medicine

## 2009-05-26 LAB — CONVERTED CEMR LAB: Hgb A1c MFr Bld: 7 % — ABNORMAL HIGH (ref 4.6–6.1)

## 2009-05-27 ENCOUNTER — Ambulatory Visit: Payer: Self-pay | Admitting: Family Medicine

## 2009-06-17 IMAGING — CR DG CHEST 2V
2 series · 2 of 2 positions shown · non-contrast
Comparison: 04/04/2007

CLINICAL DATA: Chest and back pain with shortness of breath

CHEST - 2 VIEW

[w chest pa]
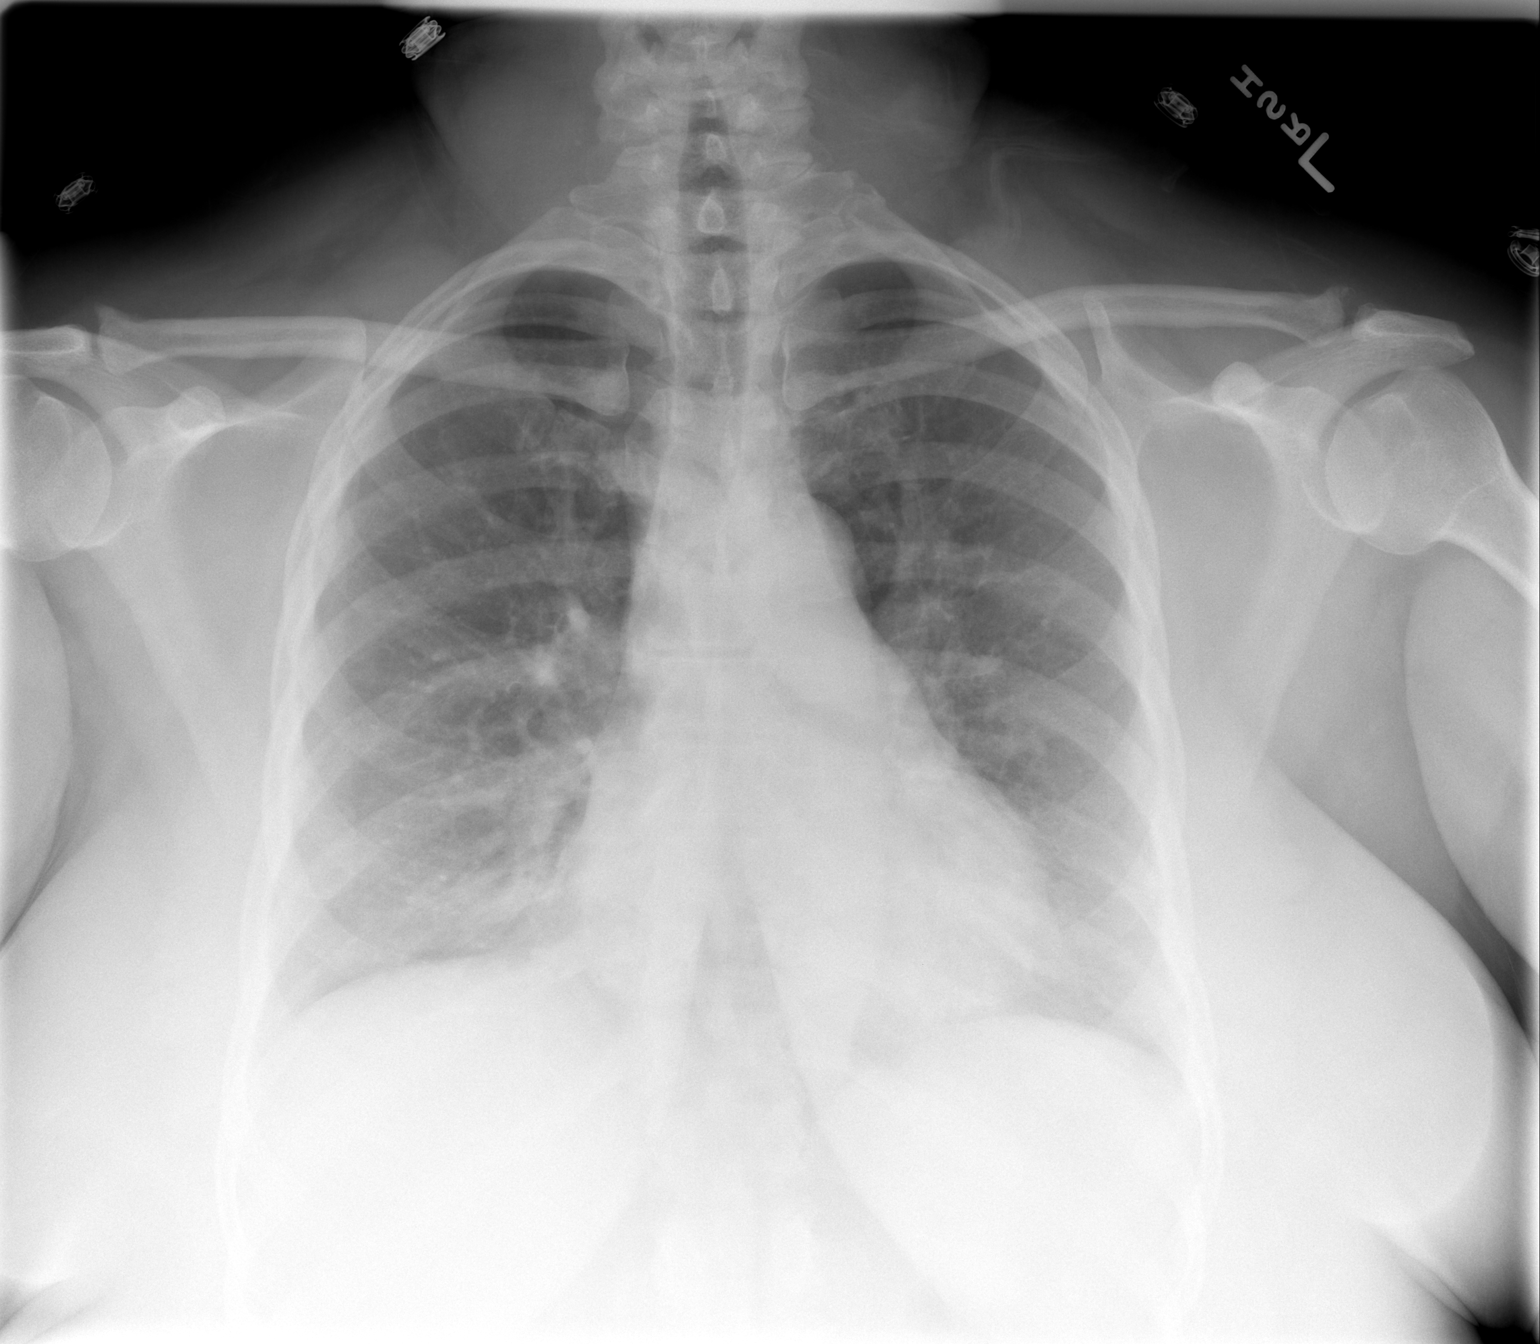

[w chest lat]
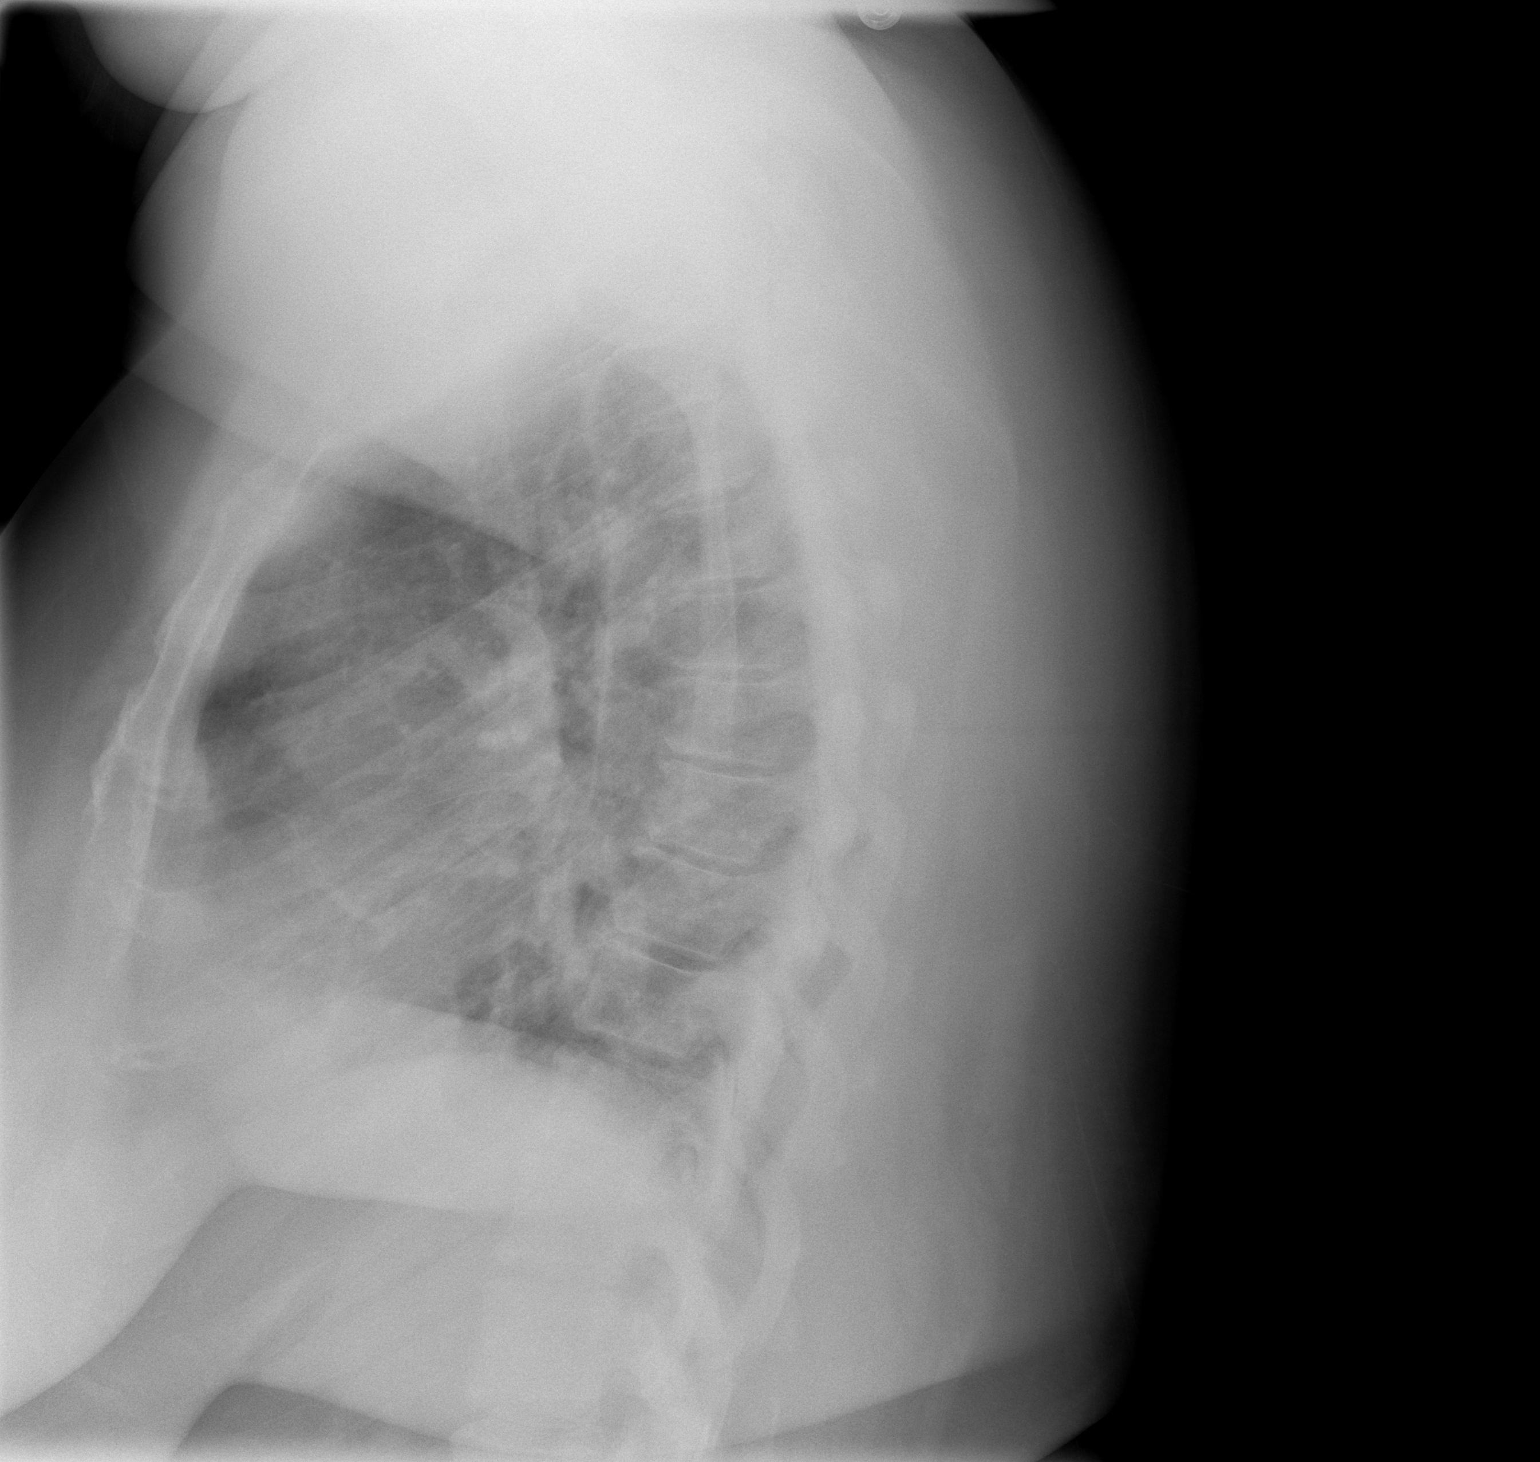

[2 of 2 positions shown; findings below may reference images not displayed]

FINDINGS: Heart size within normal limits.  Mild peribronchial
thickening without active airspace disease.  Osseous structures
intact.
IMPRESSION: 1.  Peribronchial thickening.
2.  No definite acute process.

## 2009-06-24 IMAGING — CR DG CHEST 2V
2 series · 2 of 2 positions shown · non-contrast
Comparison: 08/13/2007, chest CT and chest radiograph 04/04/2007.

CLINICAL DATA: Chest pain

CHEST - 2 VIEW

[view not recorded (1 of 2)]
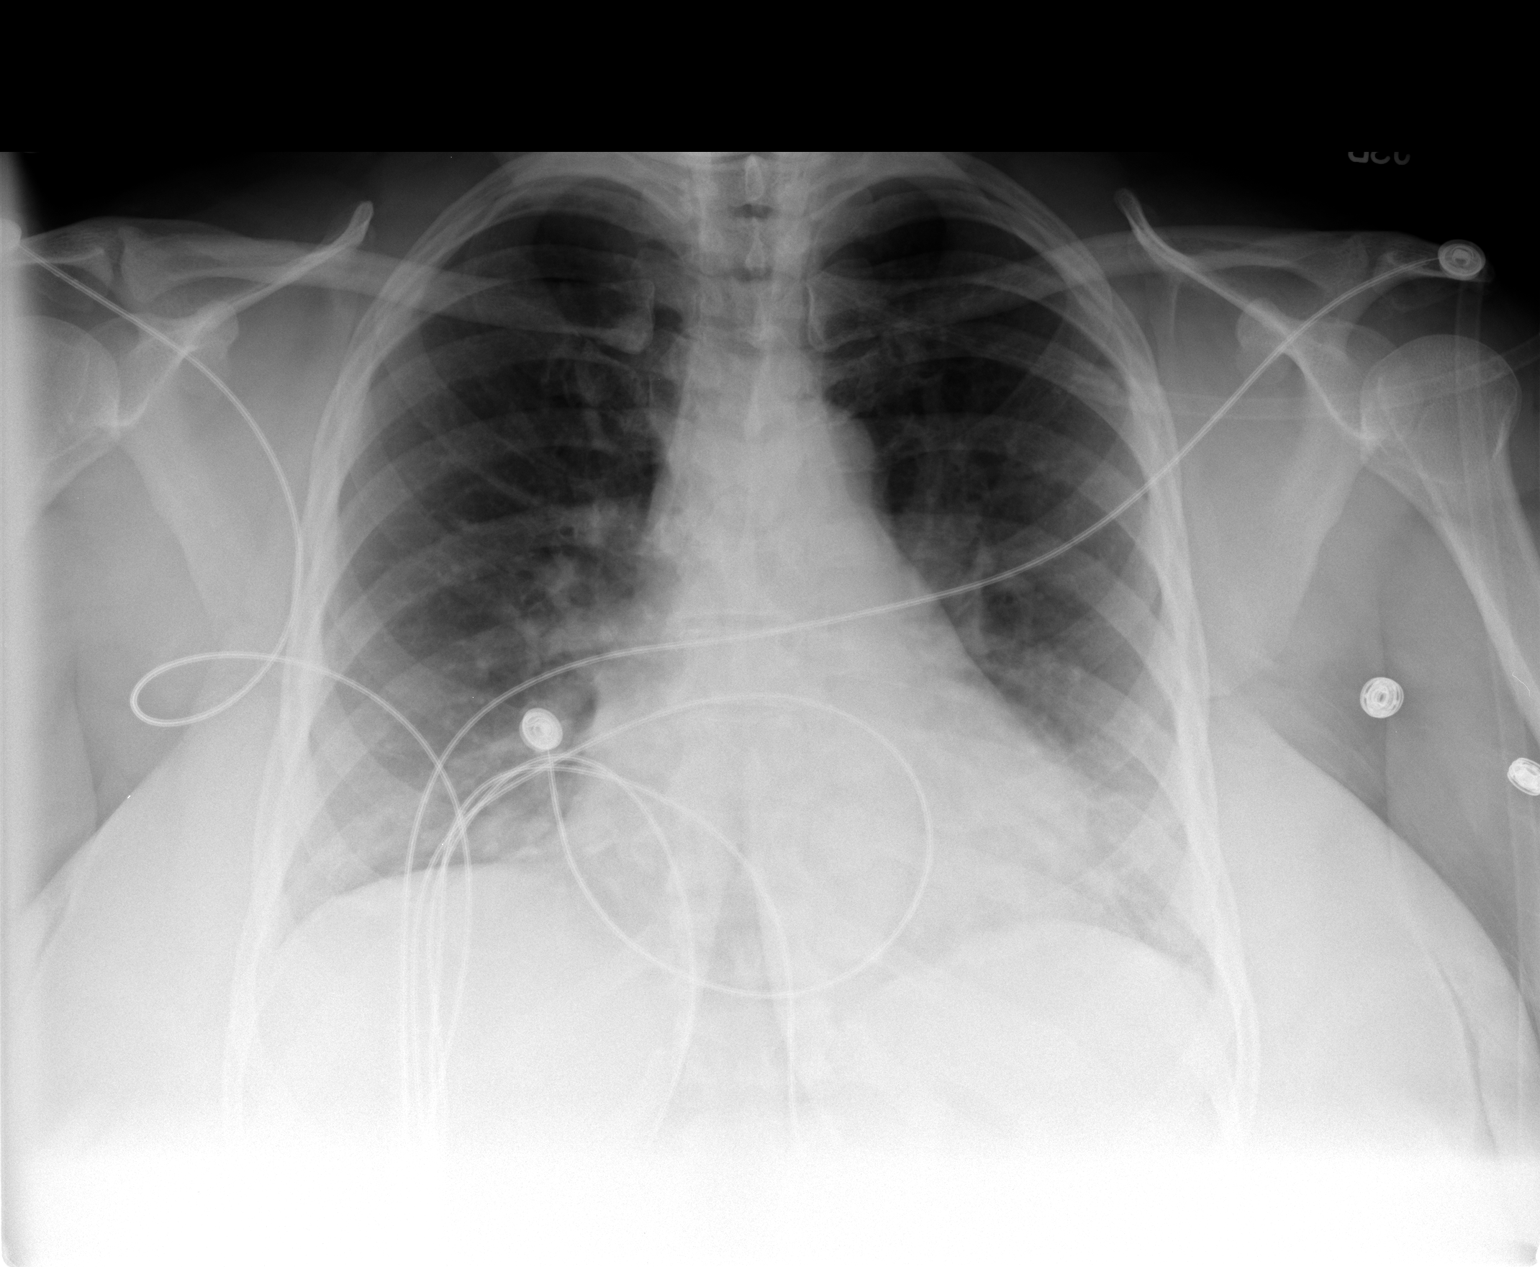

[view not recorded (2 of 2)]
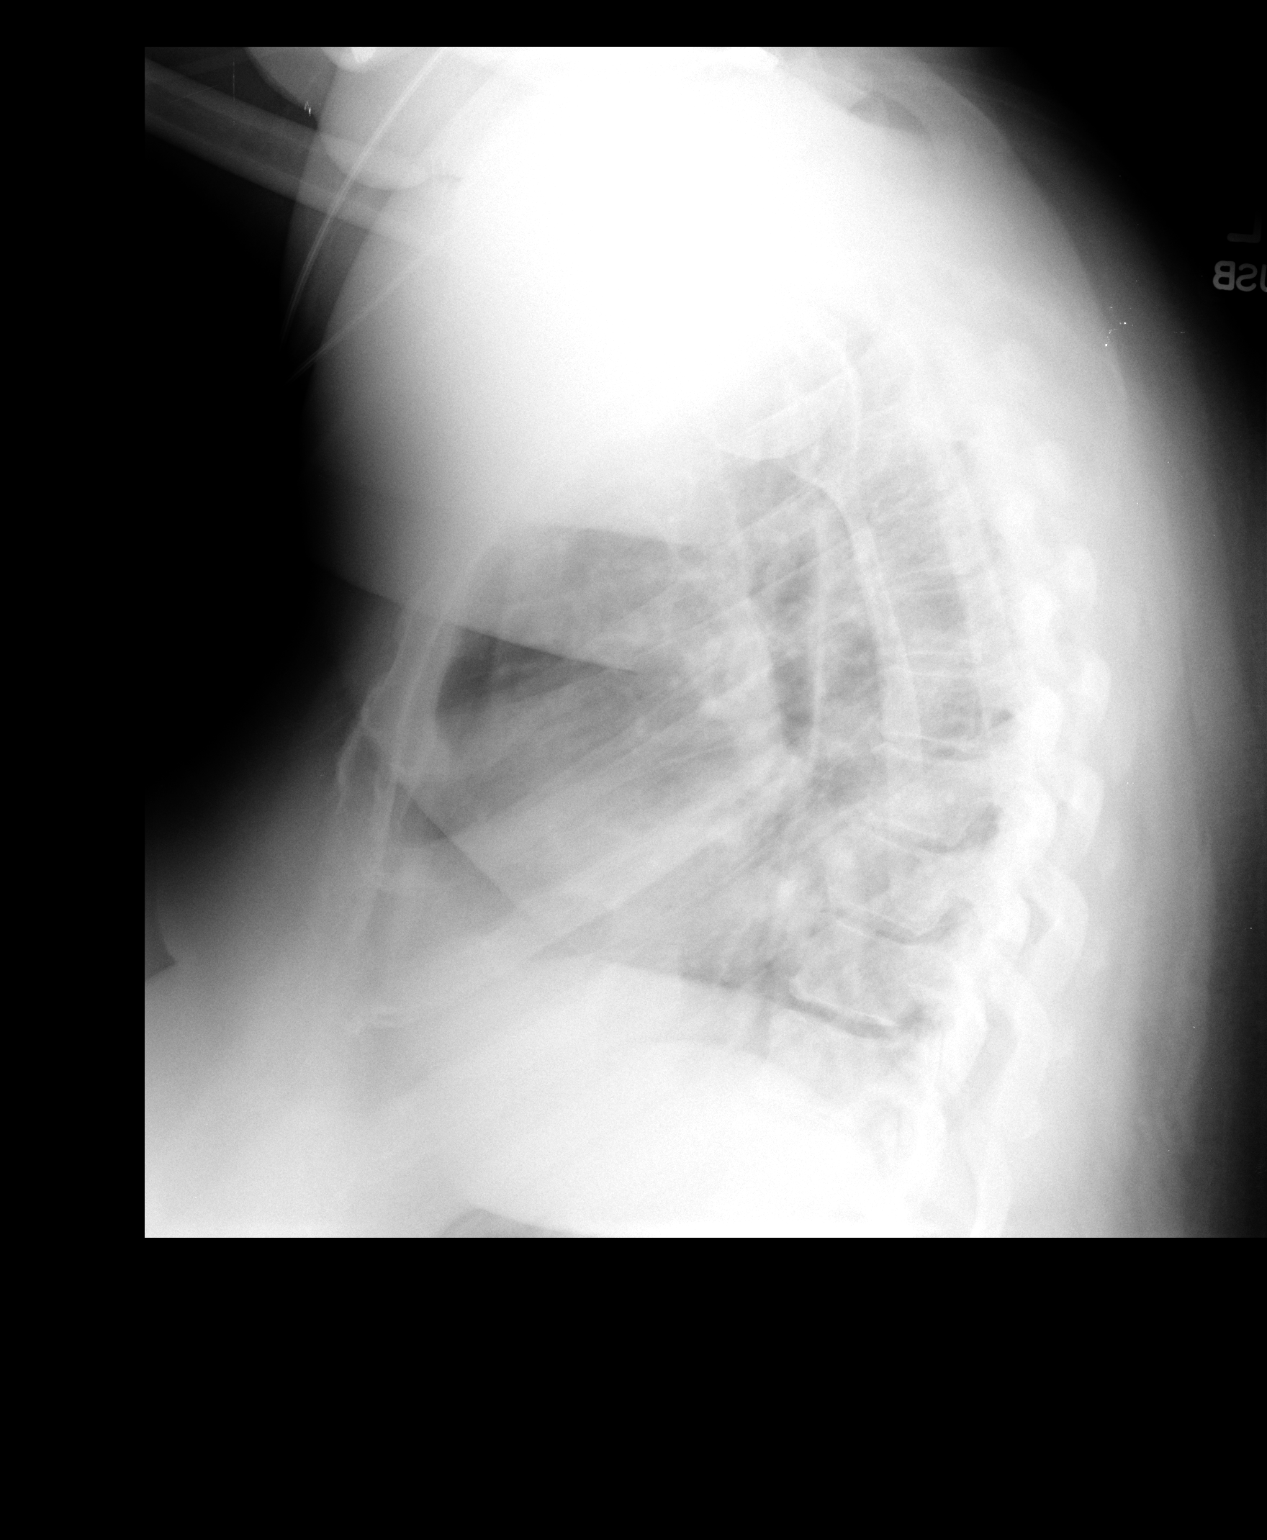

[2 of 2 positions shown; findings below may reference images not displayed]

FINDINGS: Exam is limited by patient's body habitus.
Cardiopericardial silhouette and mediastinal contours are within
normal limits, unchanged from prior exam.  Bibasilar atelectasis
and bilateral lower lobe peribronchial thickening is present,
unchanged from priors.
IMPRESSION: No active cardiopulmonary disease.

## 2009-07-20 ENCOUNTER — Ambulatory Visit (HOSPITAL_BASED_OUTPATIENT_CLINIC_OR_DEPARTMENT_OTHER): Admission: RE | Admit: 2009-07-20 | Discharge: 2009-07-20 | Payer: Self-pay | Admitting: Family Medicine

## 2009-07-24 ENCOUNTER — Ambulatory Visit: Payer: Self-pay | Admitting: Internal Medicine

## 2009-08-06 ENCOUNTER — Ambulatory Visit: Payer: Self-pay | Admitting: Family Medicine

## 2009-08-15 ENCOUNTER — Emergency Department (HOSPITAL_COMMUNITY): Admission: EM | Admit: 2009-08-15 | Discharge: 2009-08-15 | Payer: Self-pay | Admitting: Emergency Medicine

## 2009-08-20 IMAGING — CR DG CHEST 2V
2 series · 2 of 2 positions shown · non-contrast
Comparison: 08/20/2007

CLINICAL DATA: Chest pain

CHEST - 2 VIEW

[w chest pa]
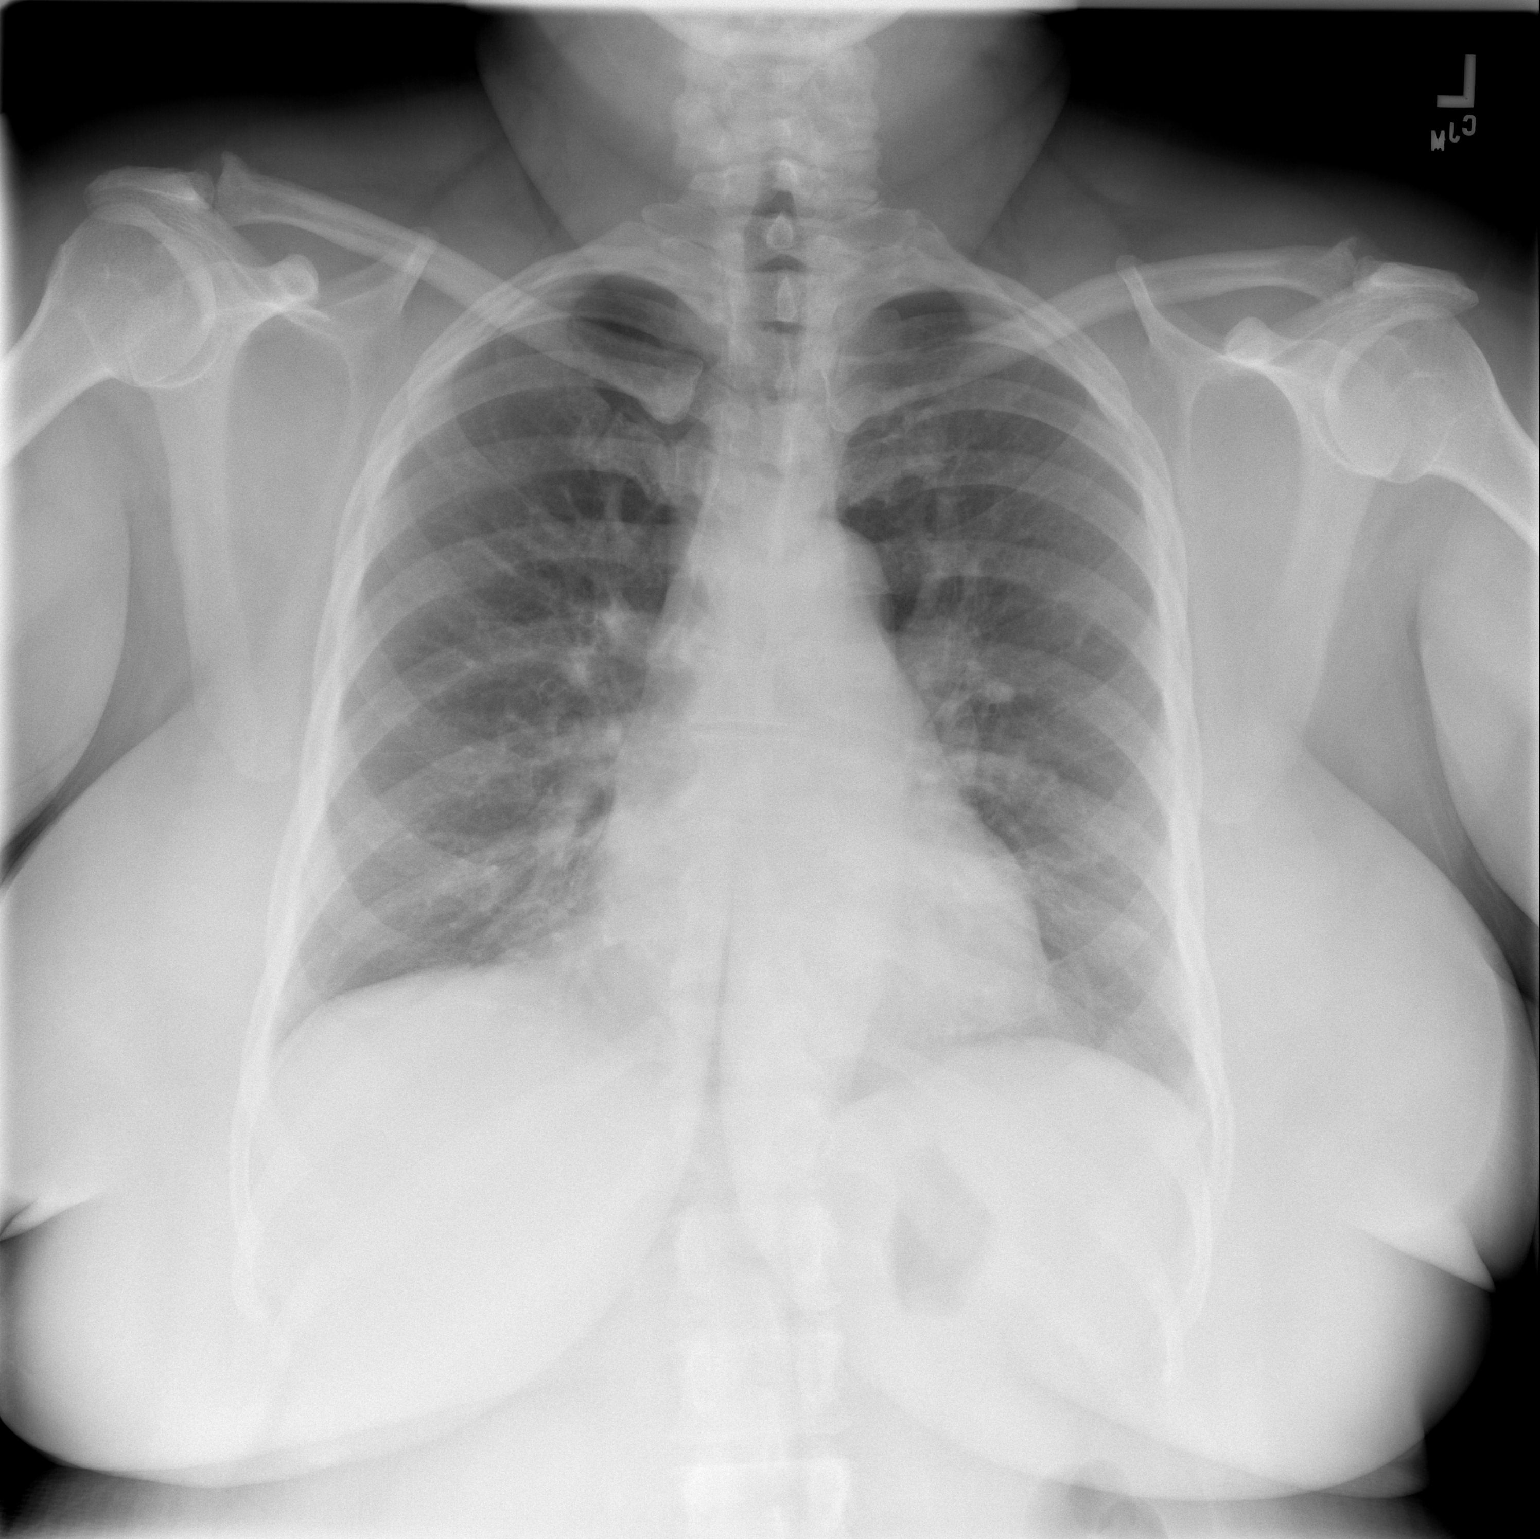

[w chest lat]
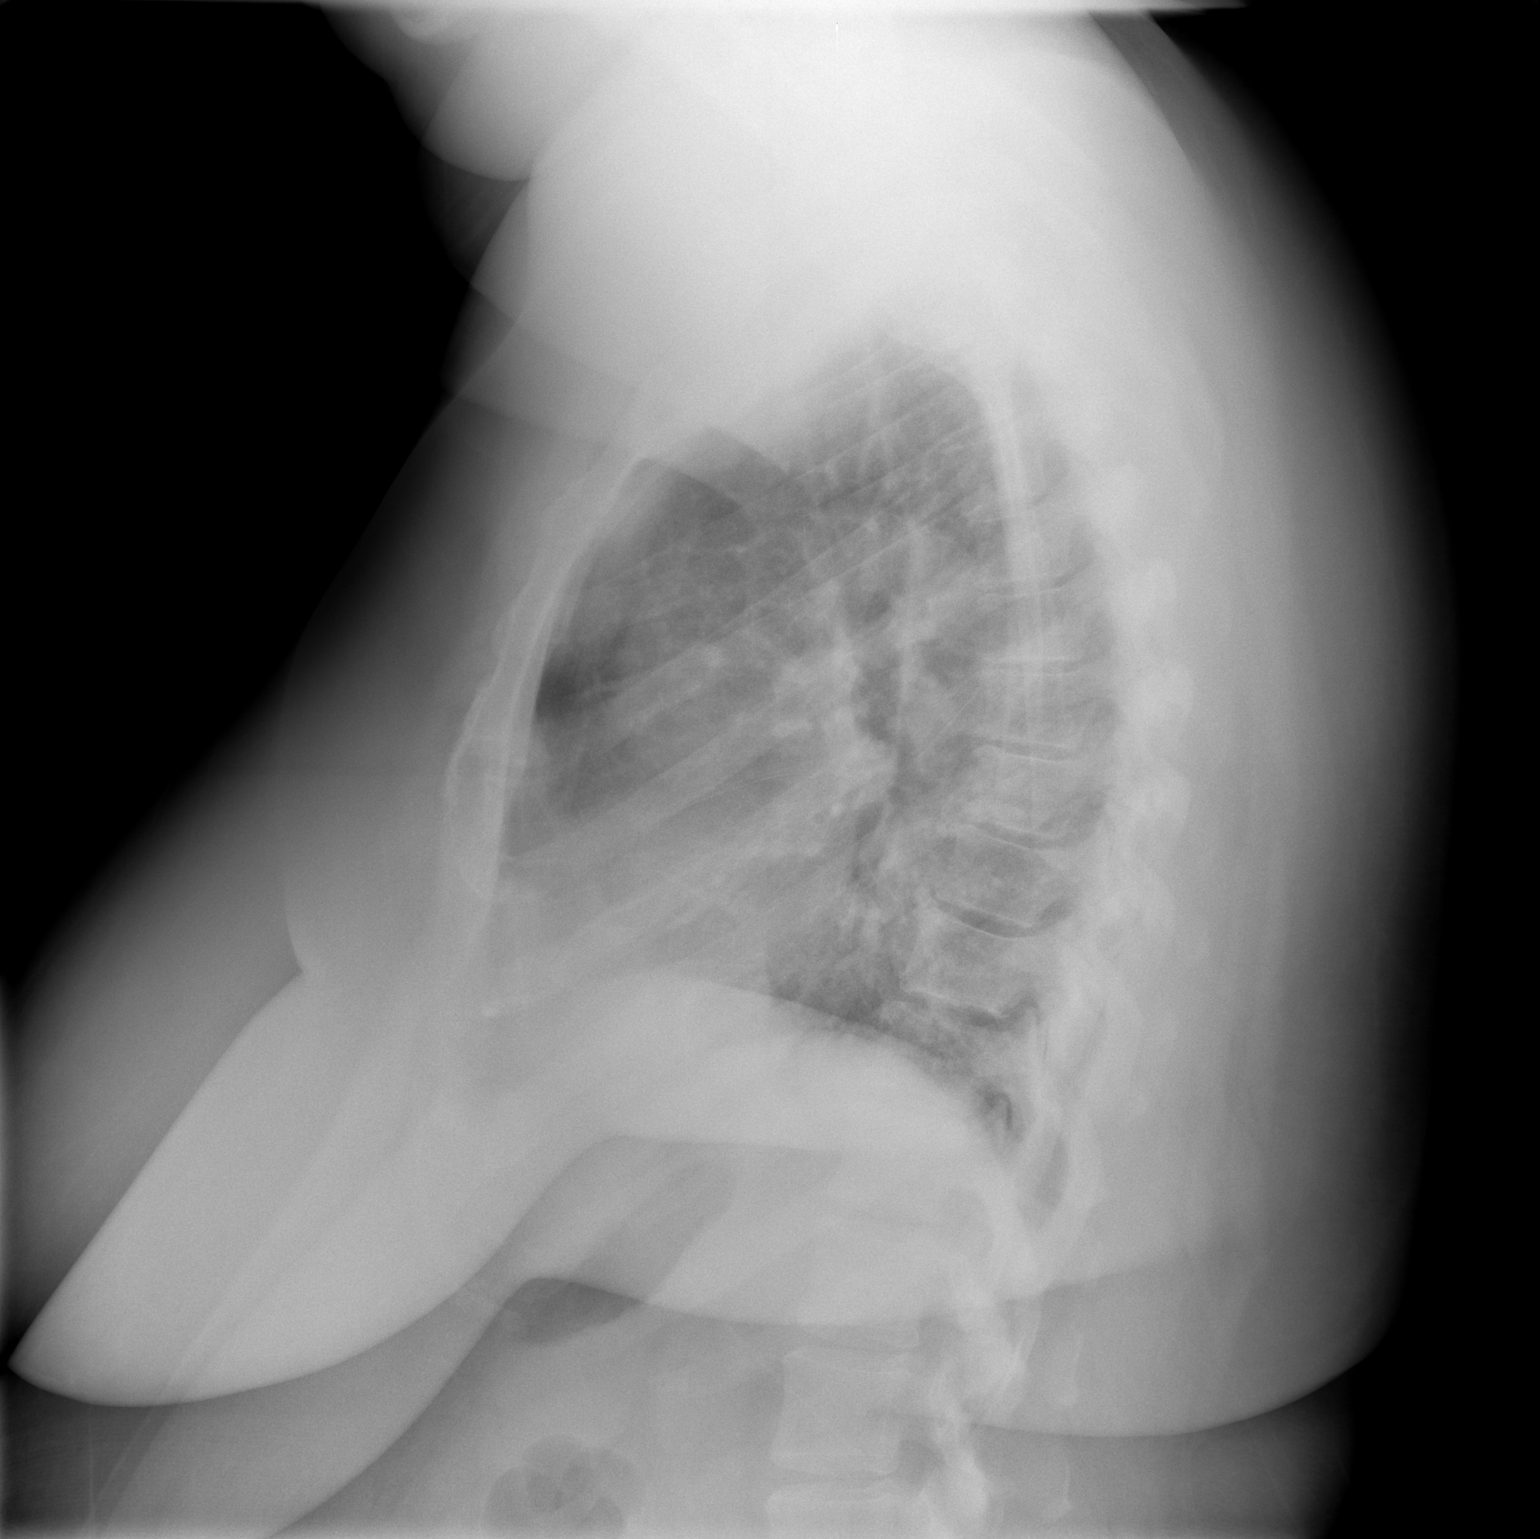

[2 of 2 positions shown; findings below may reference images not displayed]

FINDINGS: The heart size and mediastinal contours are within normal
limits.  Both lungs are clear.  The visualized skeletal structures
are unremarkable.
IMPRESSION: No active disease.

## 2009-09-17 ENCOUNTER — Ambulatory Visit: Payer: Self-pay | Admitting: Family Medicine

## 2009-10-07 ENCOUNTER — Ambulatory Visit: Payer: Self-pay | Admitting: Family Medicine

## 2009-10-14 ENCOUNTER — Ambulatory Visit: Payer: Self-pay | Admitting: Internal Medicine

## 2009-10-22 IMAGING — CT CT ANGIO CHEST
1 of 3 series · 19 of 32 positions shown · IV contrast (APPLIED)
Comparison: 11/19/2007 and 04/04/2007.

CLINICAL DATA: Chest pain.  Possible pulmonary embolus.

CT ANGIOGRAPHY CHEST
TECHNIQUE: Multidetector CT imaging of the chest using the
standard protocol during bolus administration of intravenous
contrast. Multiplanar reconstructed images obtained and reviewed to
evaluate the vascular anatomy.
Contrast: 100 ml of Gmnipaque-TLL injected at 4 ml per second.

[Series 10: pulm embolism 1.0 b25f thins · axial · 0.56mm/px · z∈[-248,-74]mm · 19 of 194 slices shown]
[im 10/194  lung]
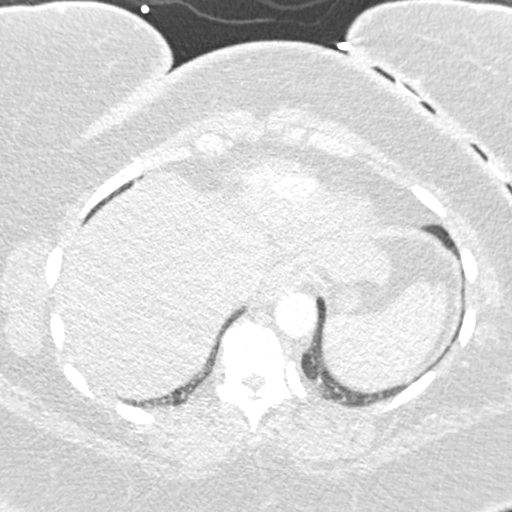
[im 20/194  mediastinal]
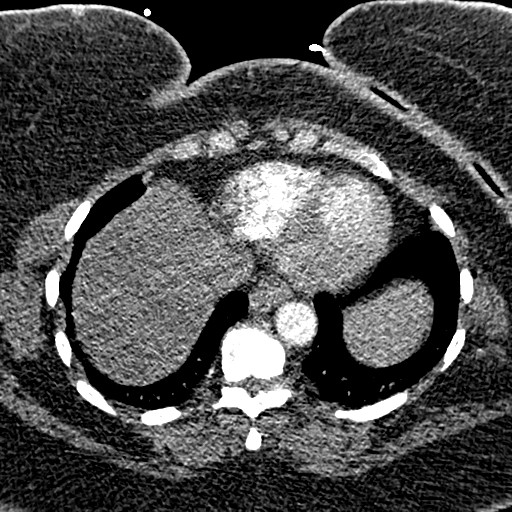
[im 29/194  lung]
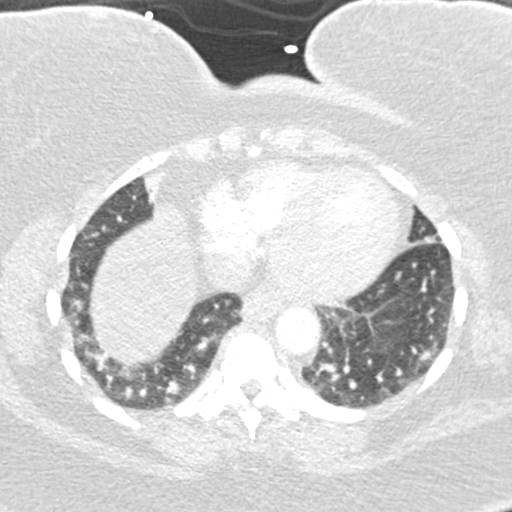
[im 49/194  mediastinal]
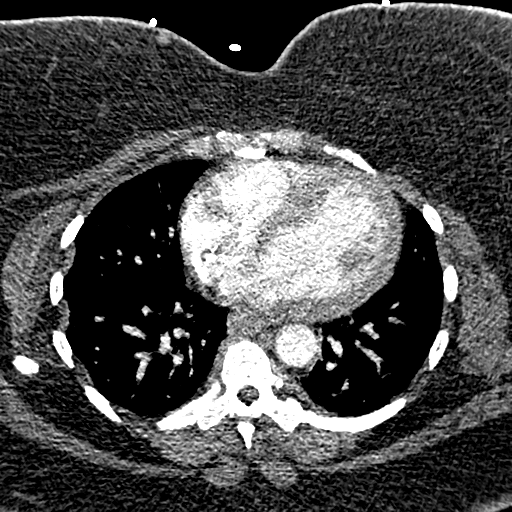
[im 58/194  lung]
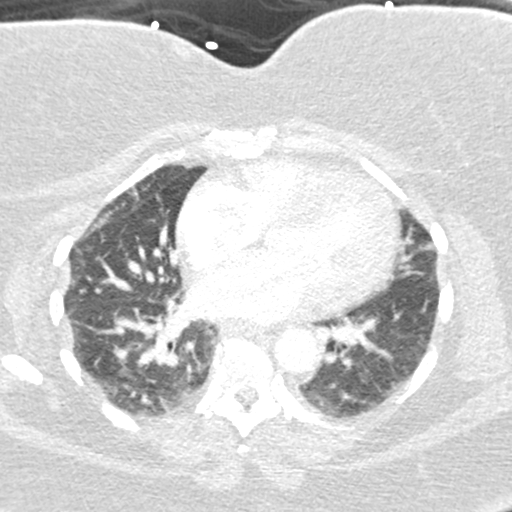
[im 65/194  mediastinal]
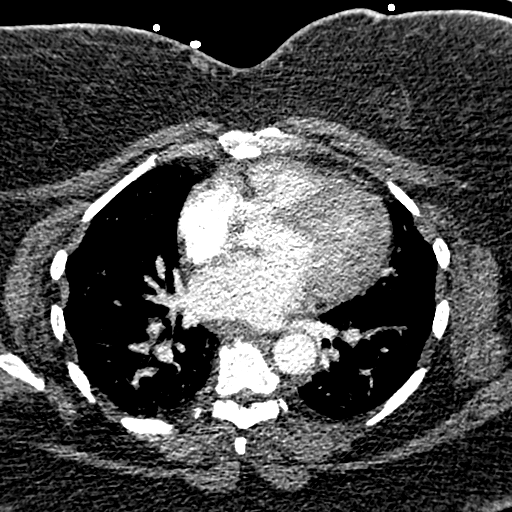
[im 68/194  lung]
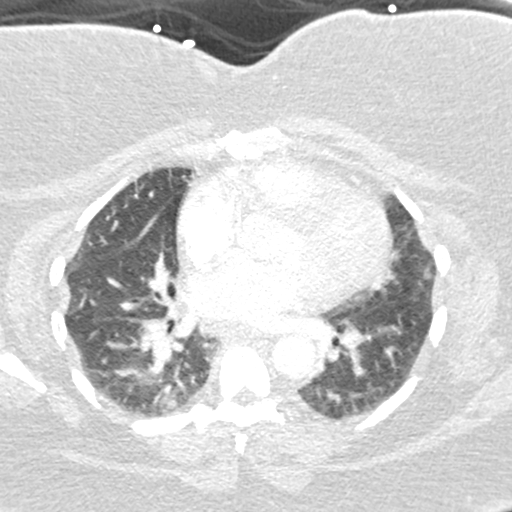
[im 78/194  mediastinal]
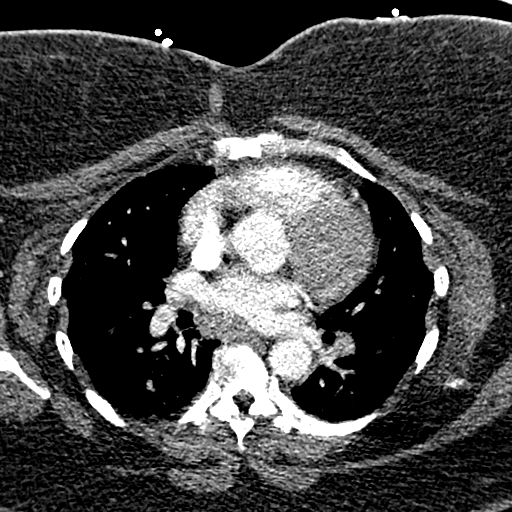
[im 87/194  lung]
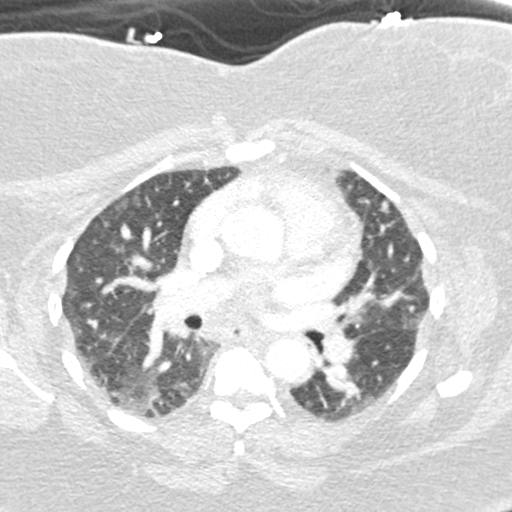
[im 97/194  mediastinal]
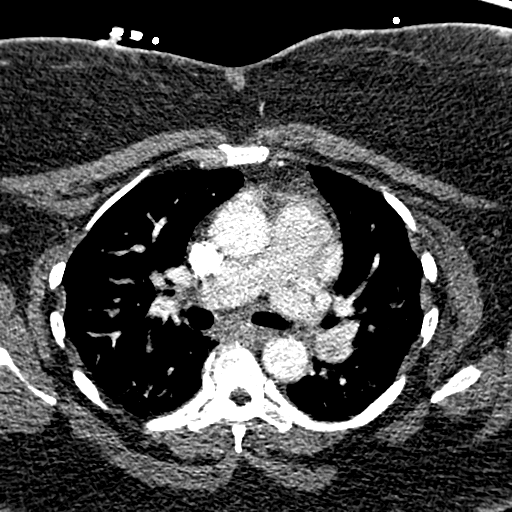
[im 107/194  lung]
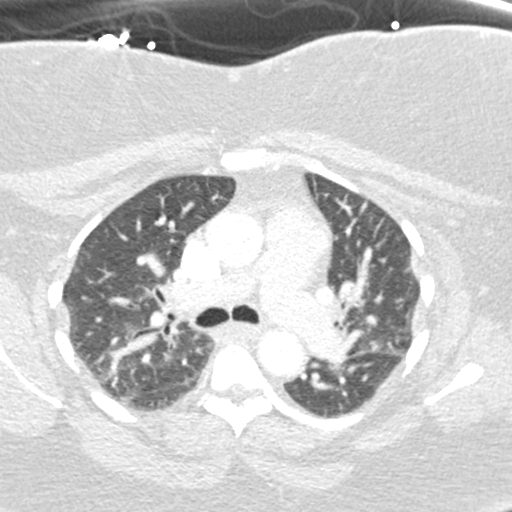
[im 116/194  mediastinal]
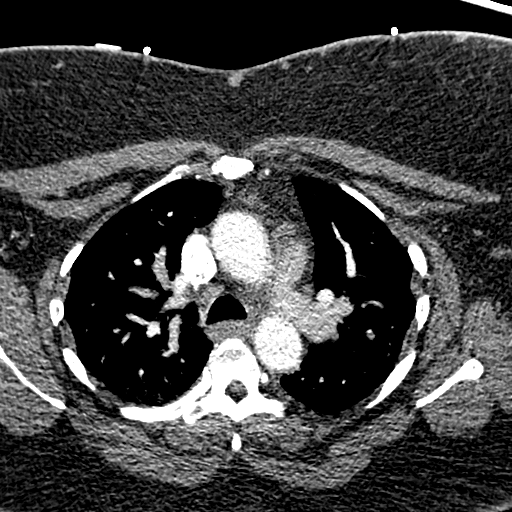
[im 126/194  lung]
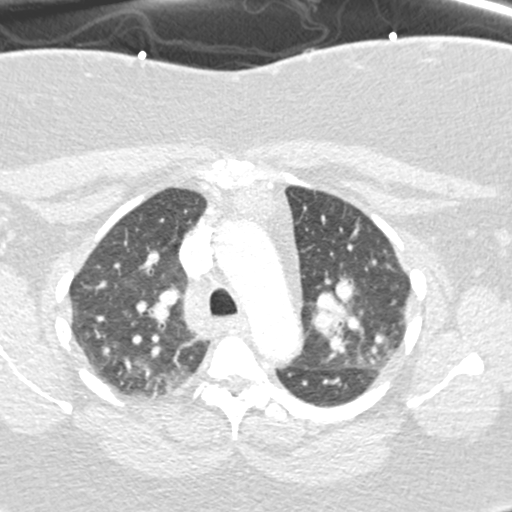
[im 129/194  mediastinal]
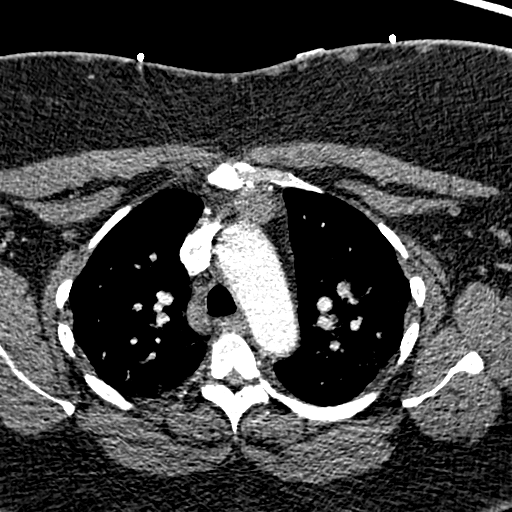
[im 136/194  lung]
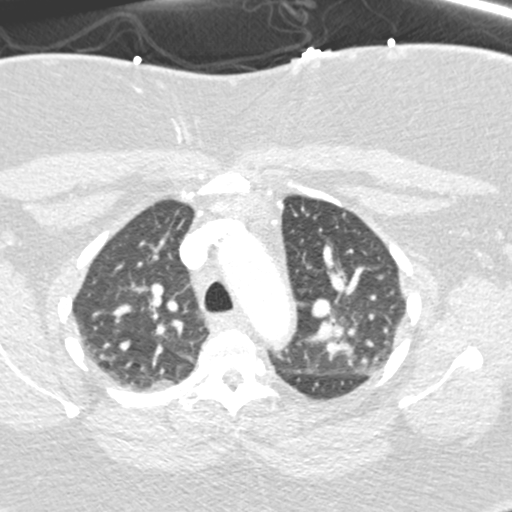
[im 145/194  mediastinal]
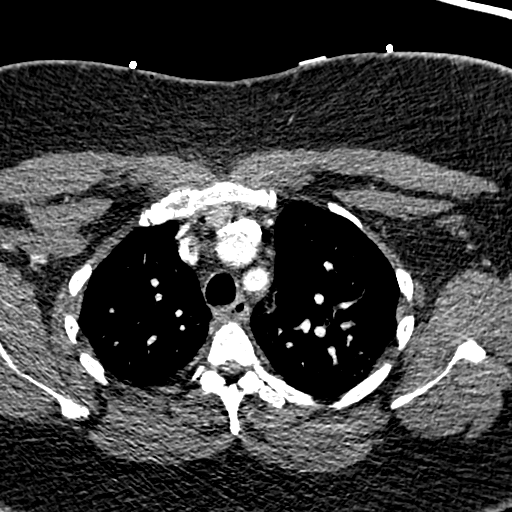
[im 165/194  lung]
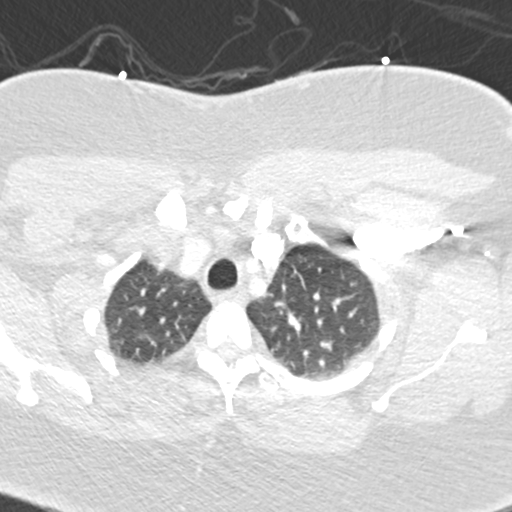
[im 174/194  mediastinal]
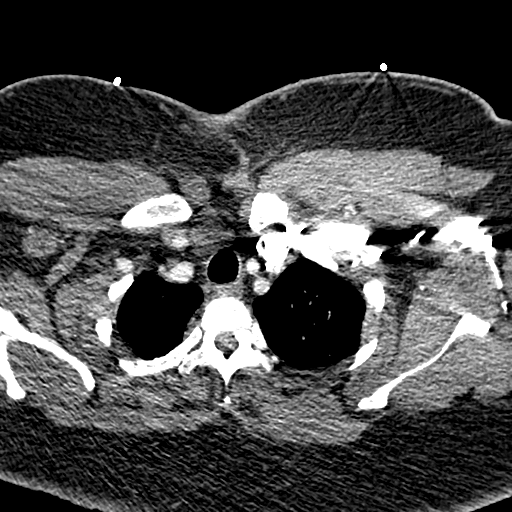
[im 184/194  lung]
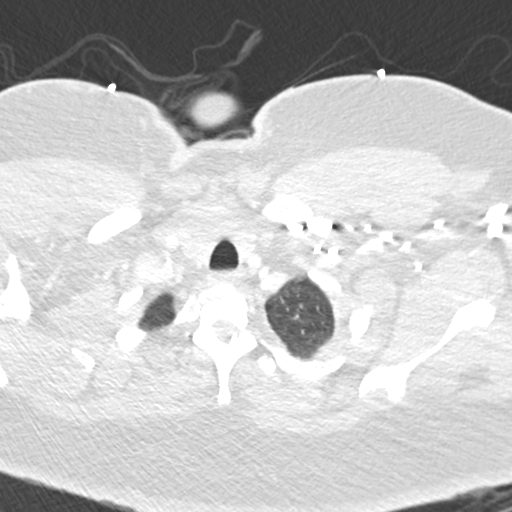

[19 of 32 positions shown; findings below may reference images not displayed]

FINDINGS: There is relatively faint enhancement of the pulmonary
arteries despite adequate bolus contrast administration.  However,
there is no CT scan evidence for pulmonary emboli.  Peripheral
pulmonary emboli cannot be excluded on the basis of this study.
There are no pathologically enlarged mediastinal or hilar lymph
nodes.  There is mild patchy perihilar air space disease present
likely due to mild perihilar edema.  The central airways are
patent.  Again seen are two anterior mediastinal masses.  The
larger  anterior mediastinal mass  is unchanged compared with prior
studies measuring 2.6 x 1.8 cm in size on today's study (image 23
series 9).  Also the smaller anterior mediastinal mass posterior to
the manubrium is unchanged measuring 1.4 x 1.0 cm in size on image
17 series 9.  There is no pericardial effusion.  Diffuse fatty
changes again noted within the liver.  No worrisome pulmonary
nodules.  There is stable cardiomegaly.
IMPRESSION: 1.  No CT scan evidence for pulmonary emboli.  There is somewhat
suboptimal enhancement of the pulmonary arterial system despite
good bolus contrast administration. This is likely due to the
patient's body habitus and cardiomegaly).
2.  Stable anterior mediastinal soft tissue masses.
3.  Mild perihilar air space disease - probable mild perihilar
edema.
4.  Stable cardiomegaly.

## 2009-10-22 IMAGING — CR DG CHEST 2V
2 series · 2 of 2 positions shown · non-contrast
Comparison: 10/16/2007 and chest CT dated 11/13/2007.

CLINICAL DATA: Mid chest pain and shortness of breath.
Hypertension.

CHEST - 2 VIEW

[w chest pa]
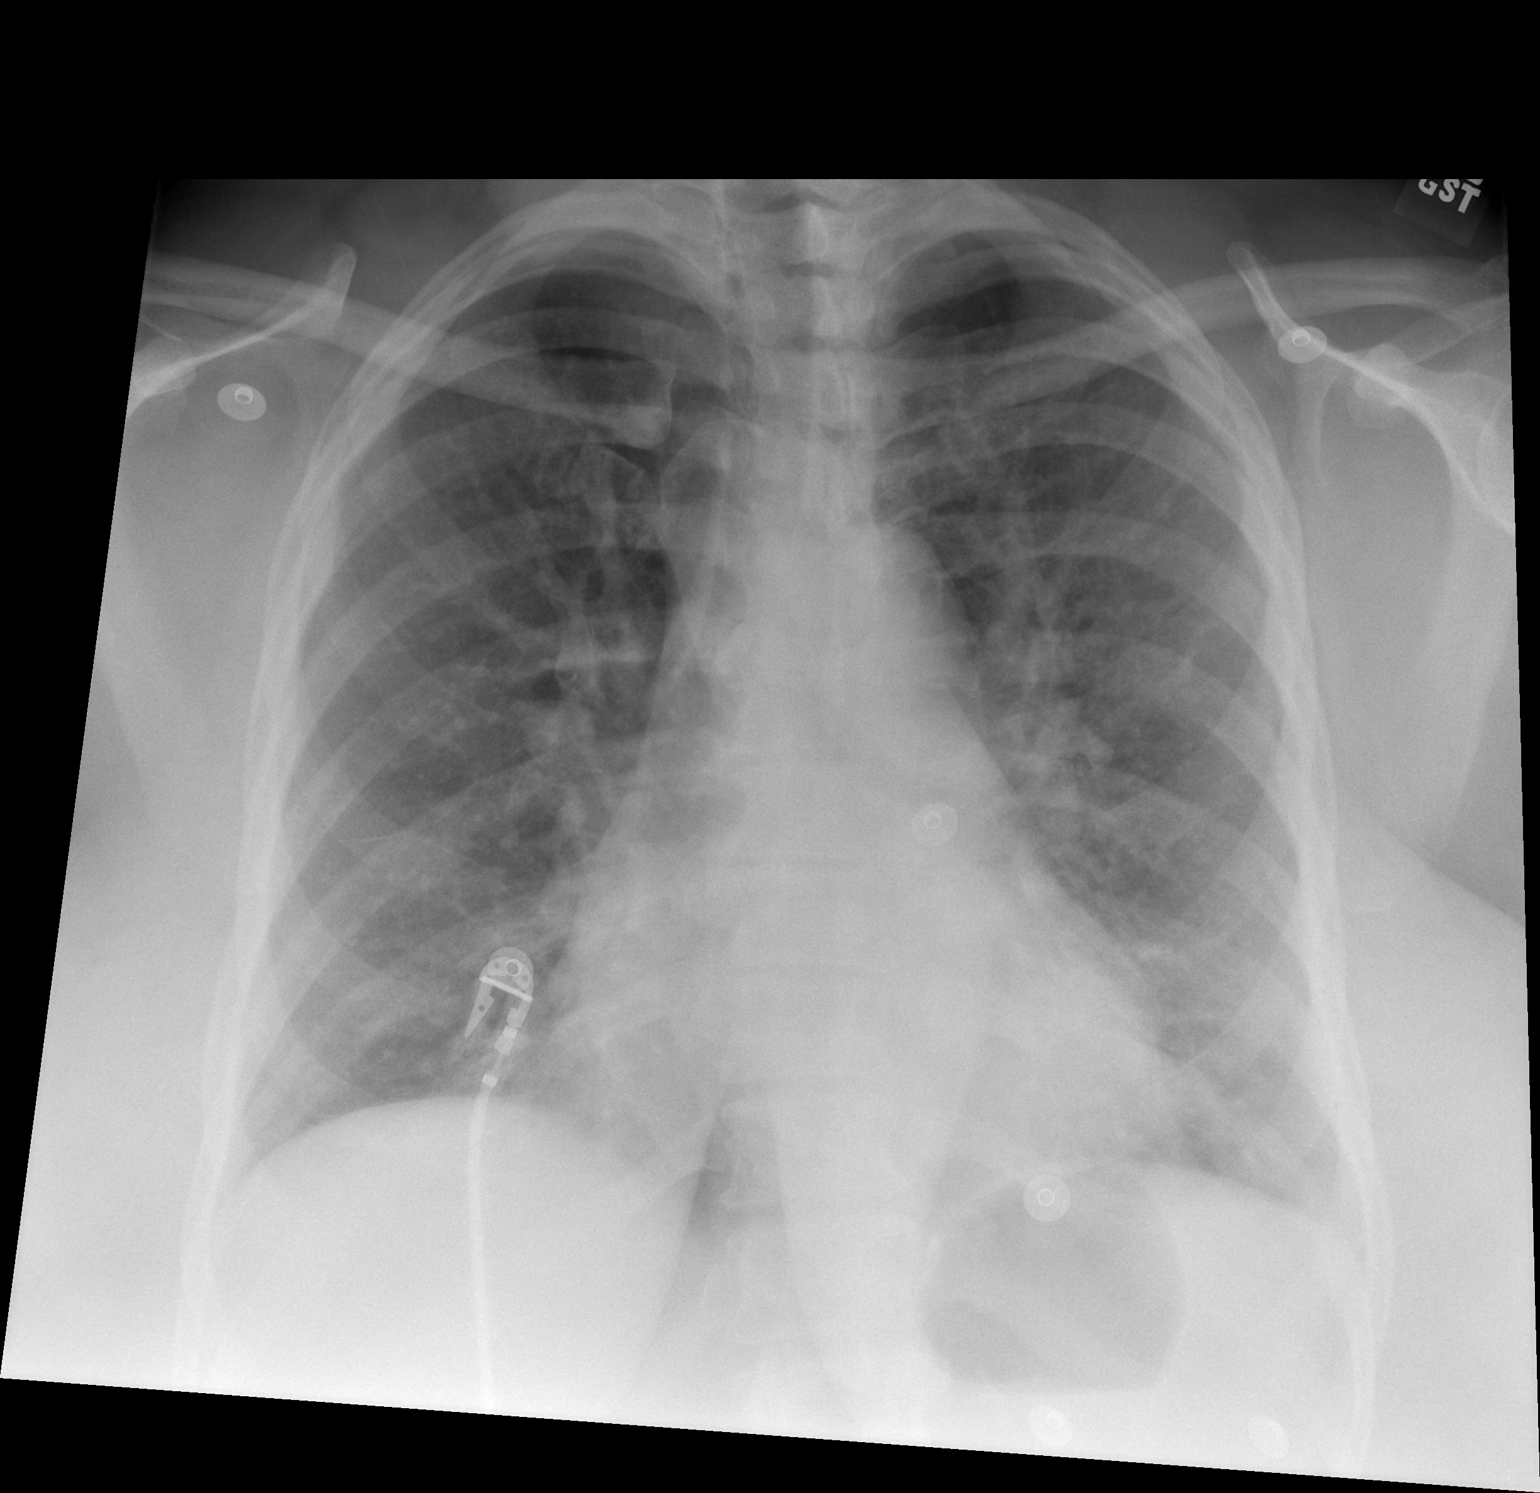

[w chest lat]
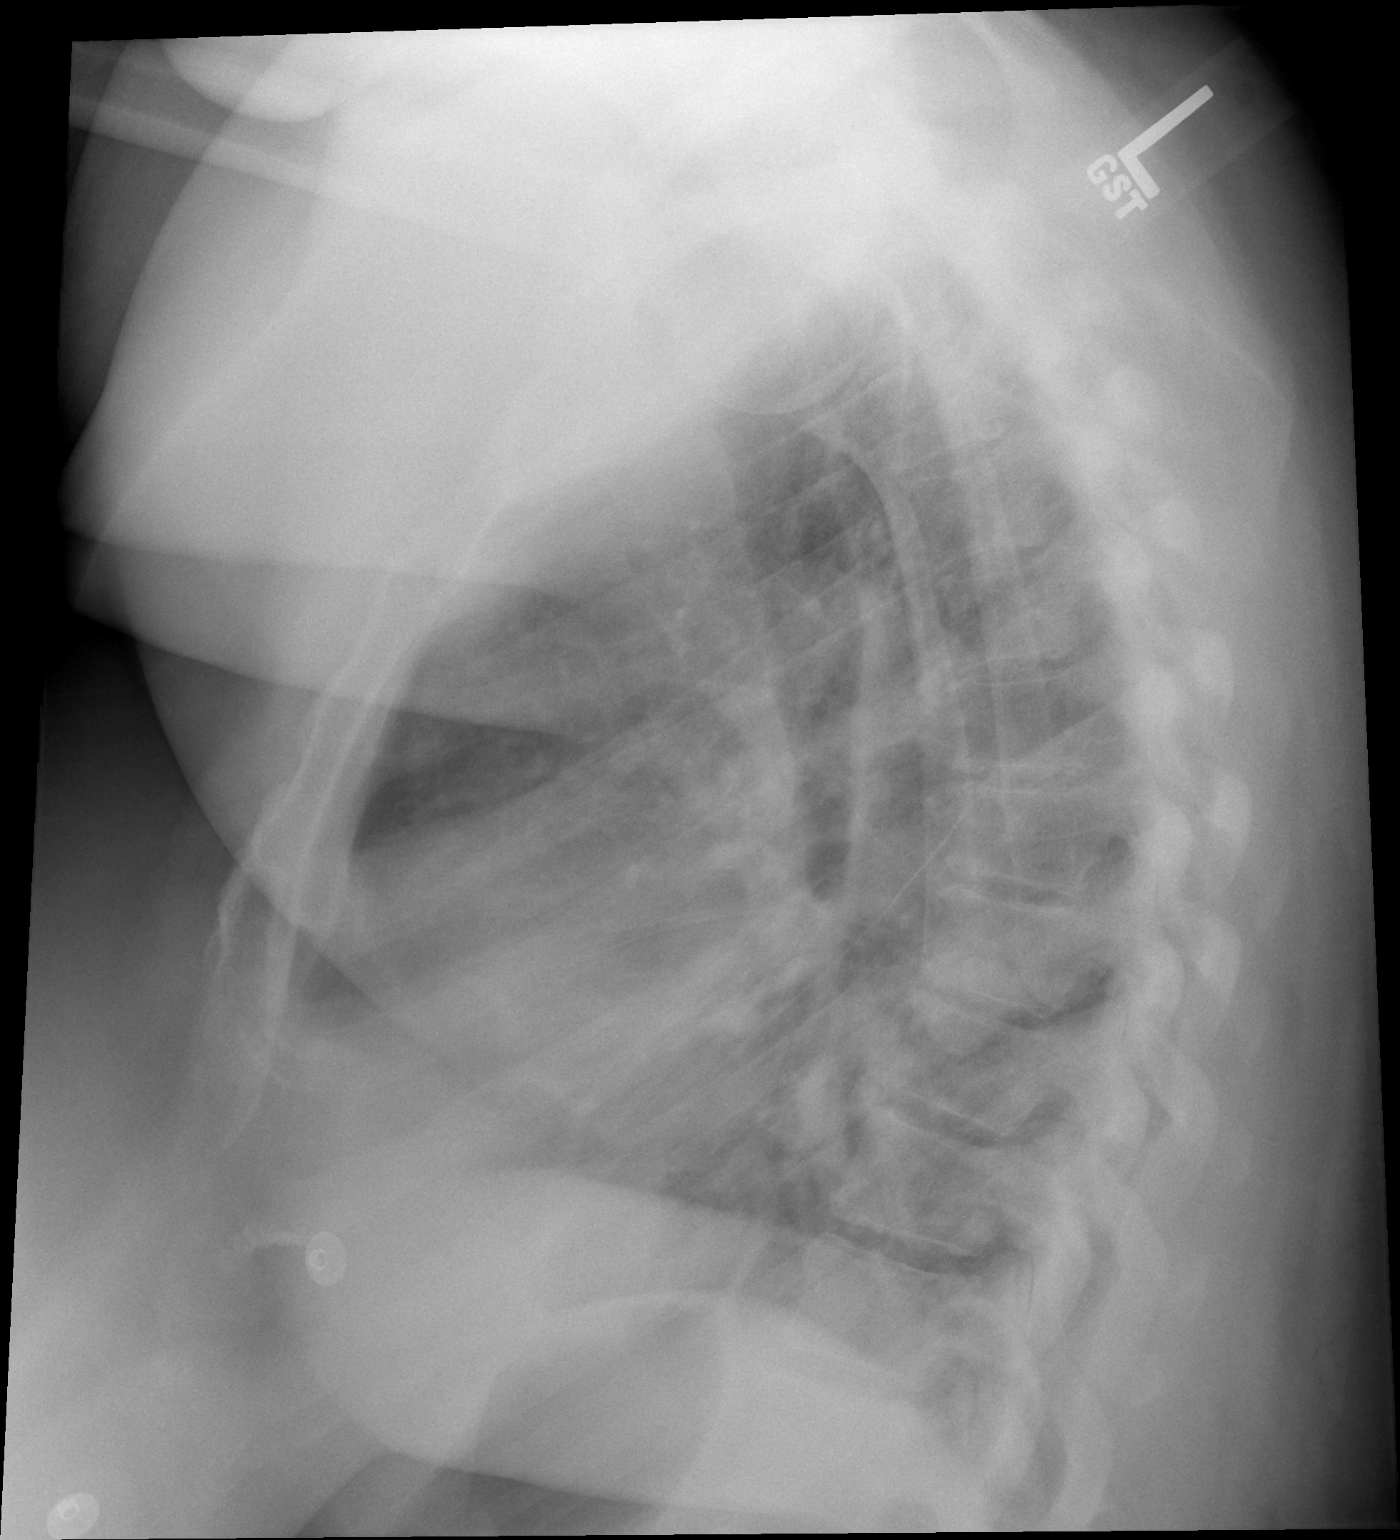

[2 of 2 positions shown; findings below may reference images not displayed]

FINDINGS: Mildly enlarged cardiac silhouette with an interval
increase in size.  Increased prominence of the pulmonary
vasculature.  There is also a suggestion of mild interstitial
prominence which was not previously present.  This is difficult to
assess due to breathing motion blurring.  Minimal scoliosis.
IMPRESSION: Interval mild cardiomegaly and mild changes of congestive heart
failure.

## 2009-11-06 ENCOUNTER — Emergency Department (HOSPITAL_COMMUNITY): Admission: EM | Admit: 2009-11-06 | Discharge: 2009-11-06 | Payer: Self-pay | Admitting: Emergency Medicine

## 2009-11-09 ENCOUNTER — Encounter: Payer: Self-pay | Admitting: Cardiovascular Disease

## 2009-12-17 ENCOUNTER — Ambulatory Visit: Payer: Self-pay | Admitting: Family Medicine

## 2009-12-17 ENCOUNTER — Encounter: Payer: Self-pay | Admitting: Cardiovascular Disease

## 2009-12-22 ENCOUNTER — Encounter: Payer: Self-pay | Admitting: Cardiovascular Disease

## 2009-12-23 ENCOUNTER — Encounter: Payer: Self-pay | Admitting: Cardiovascular Disease

## 2009-12-23 DIAGNOSIS — H409 Unspecified glaucoma: Secondary | ICD-10-CM | POA: Insufficient documentation

## 2009-12-23 DIAGNOSIS — R079 Chest pain, unspecified: Secondary | ICD-10-CM | POA: Insufficient documentation

## 2009-12-23 DIAGNOSIS — J449 Chronic obstructive pulmonary disease, unspecified: Secondary | ICD-10-CM | POA: Insufficient documentation

## 2009-12-23 DIAGNOSIS — M129 Arthropathy, unspecified: Secondary | ICD-10-CM | POA: Insufficient documentation

## 2009-12-23 DIAGNOSIS — J45909 Unspecified asthma, uncomplicated: Secondary | ICD-10-CM | POA: Insufficient documentation

## 2009-12-23 DIAGNOSIS — J4489 Other specified chronic obstructive pulmonary disease: Secondary | ICD-10-CM | POA: Insufficient documentation

## 2009-12-23 DIAGNOSIS — E785 Hyperlipidemia, unspecified: Secondary | ICD-10-CM | POA: Insufficient documentation

## 2010-01-23 ENCOUNTER — Emergency Department (HOSPITAL_COMMUNITY): Admission: EM | Admit: 2010-01-23 | Discharge: 2010-01-24 | Payer: Self-pay | Admitting: Emergency Medicine

## 2010-01-31 ENCOUNTER — Ambulatory Visit: Payer: Self-pay | Admitting: Internal Medicine

## 2010-02-14 ENCOUNTER — Emergency Department (HOSPITAL_COMMUNITY): Admission: EM | Admit: 2010-02-14 | Discharge: 2010-02-14 | Payer: Self-pay | Admitting: Emergency Medicine

## 2010-03-01 ENCOUNTER — Encounter (INDEPENDENT_AMBULATORY_CARE_PROVIDER_SITE_OTHER): Payer: Self-pay | Admitting: Family Medicine

## 2010-03-01 LAB — CONVERTED CEMR LAB: Microalb, Ur: 0.51 mg/dL (ref 0.00–1.89)

## 2010-03-15 ENCOUNTER — Ambulatory Visit: Payer: Self-pay | Admitting: Family Medicine

## 2010-05-14 ENCOUNTER — Encounter: Payer: Self-pay | Admitting: Thoracic Surgery

## 2010-05-15 ENCOUNTER — Encounter: Payer: Self-pay | Admitting: Thoracic Surgery

## 2010-05-21 IMAGING — CR DG CHEST 2V
2 series · 2 of 2 positions shown · non-contrast
Comparison: 12/18/2007

CLINICAL DATA: Cough, chest pain, fever, history blood clots

CHEST - 2 VIEW

[w chest pa]
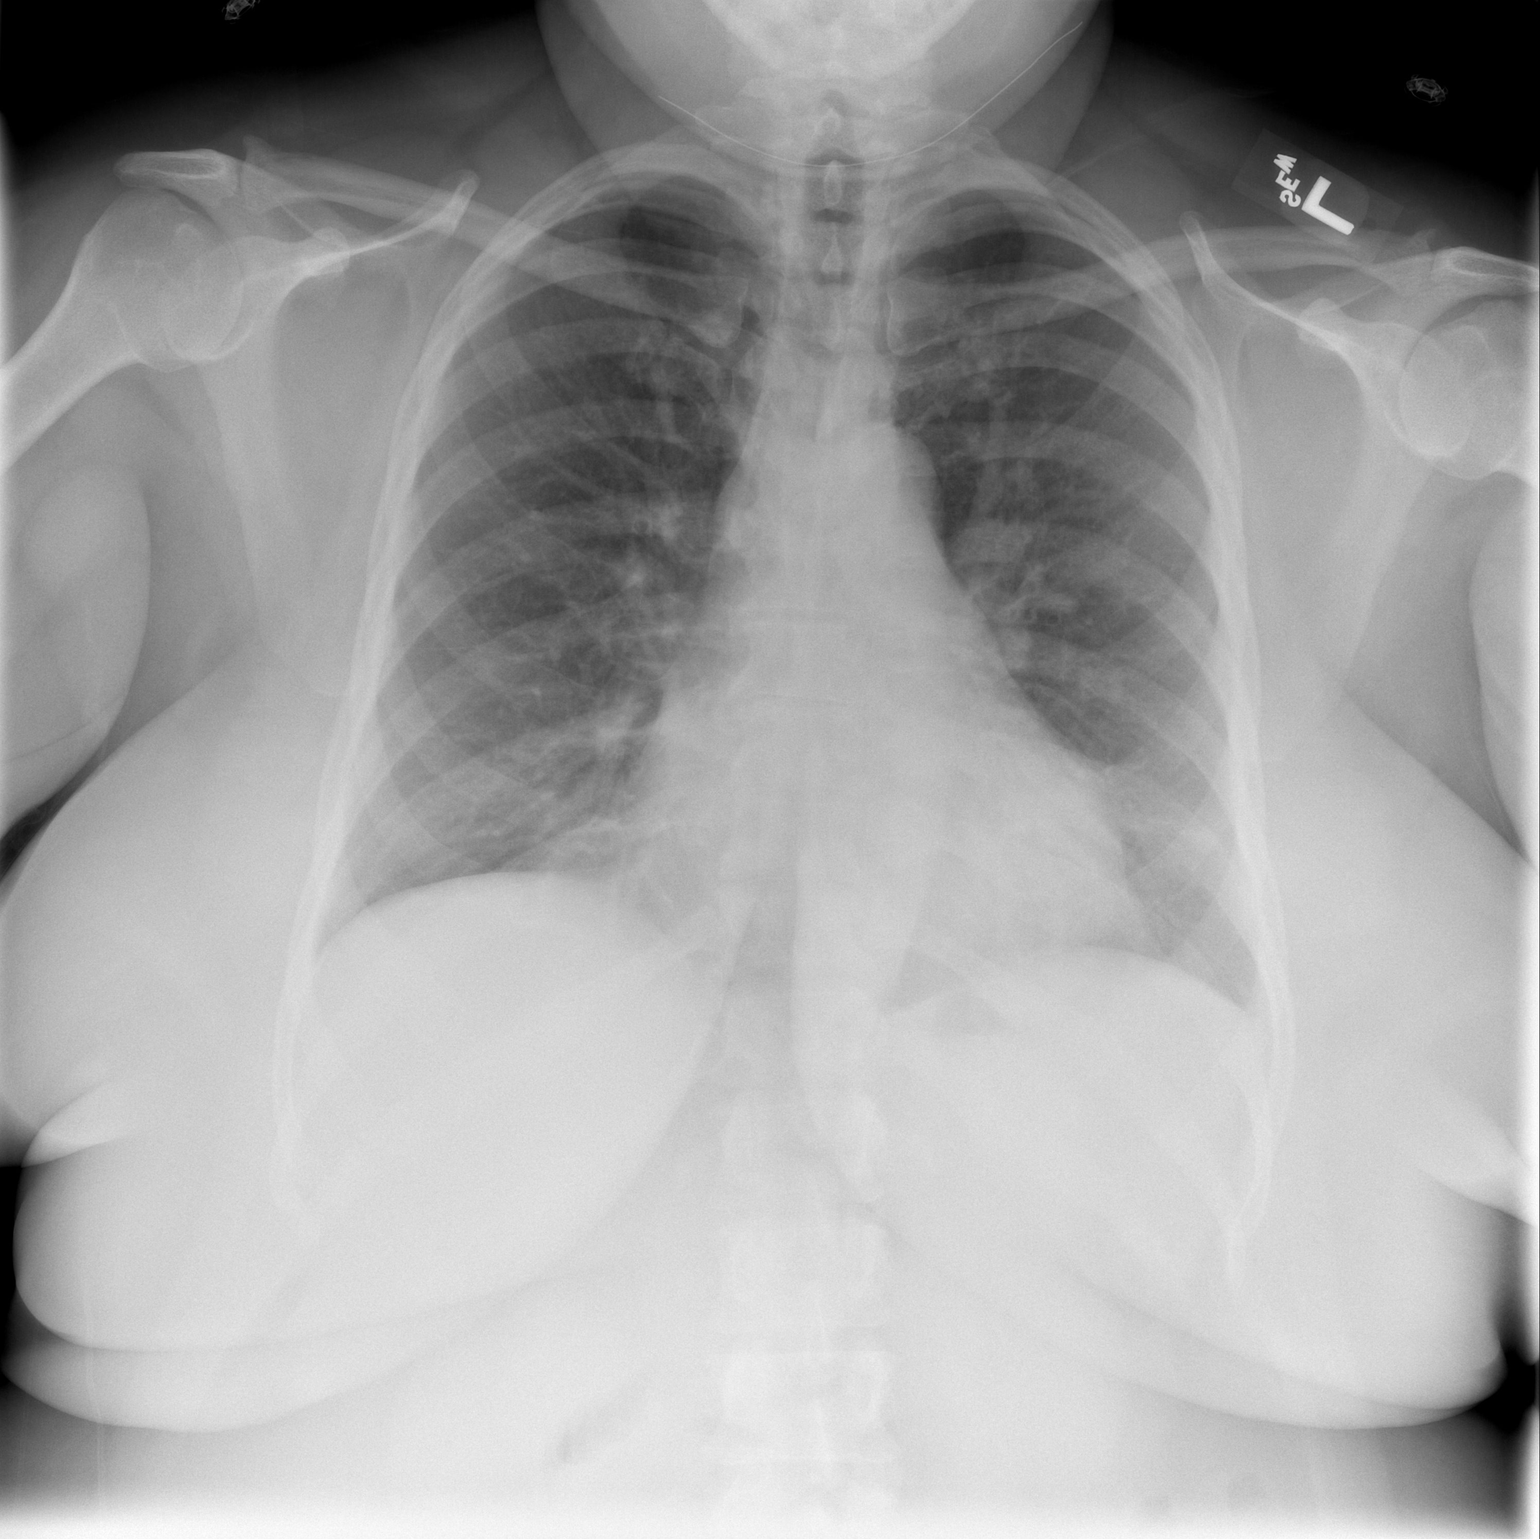

[w chest lat]
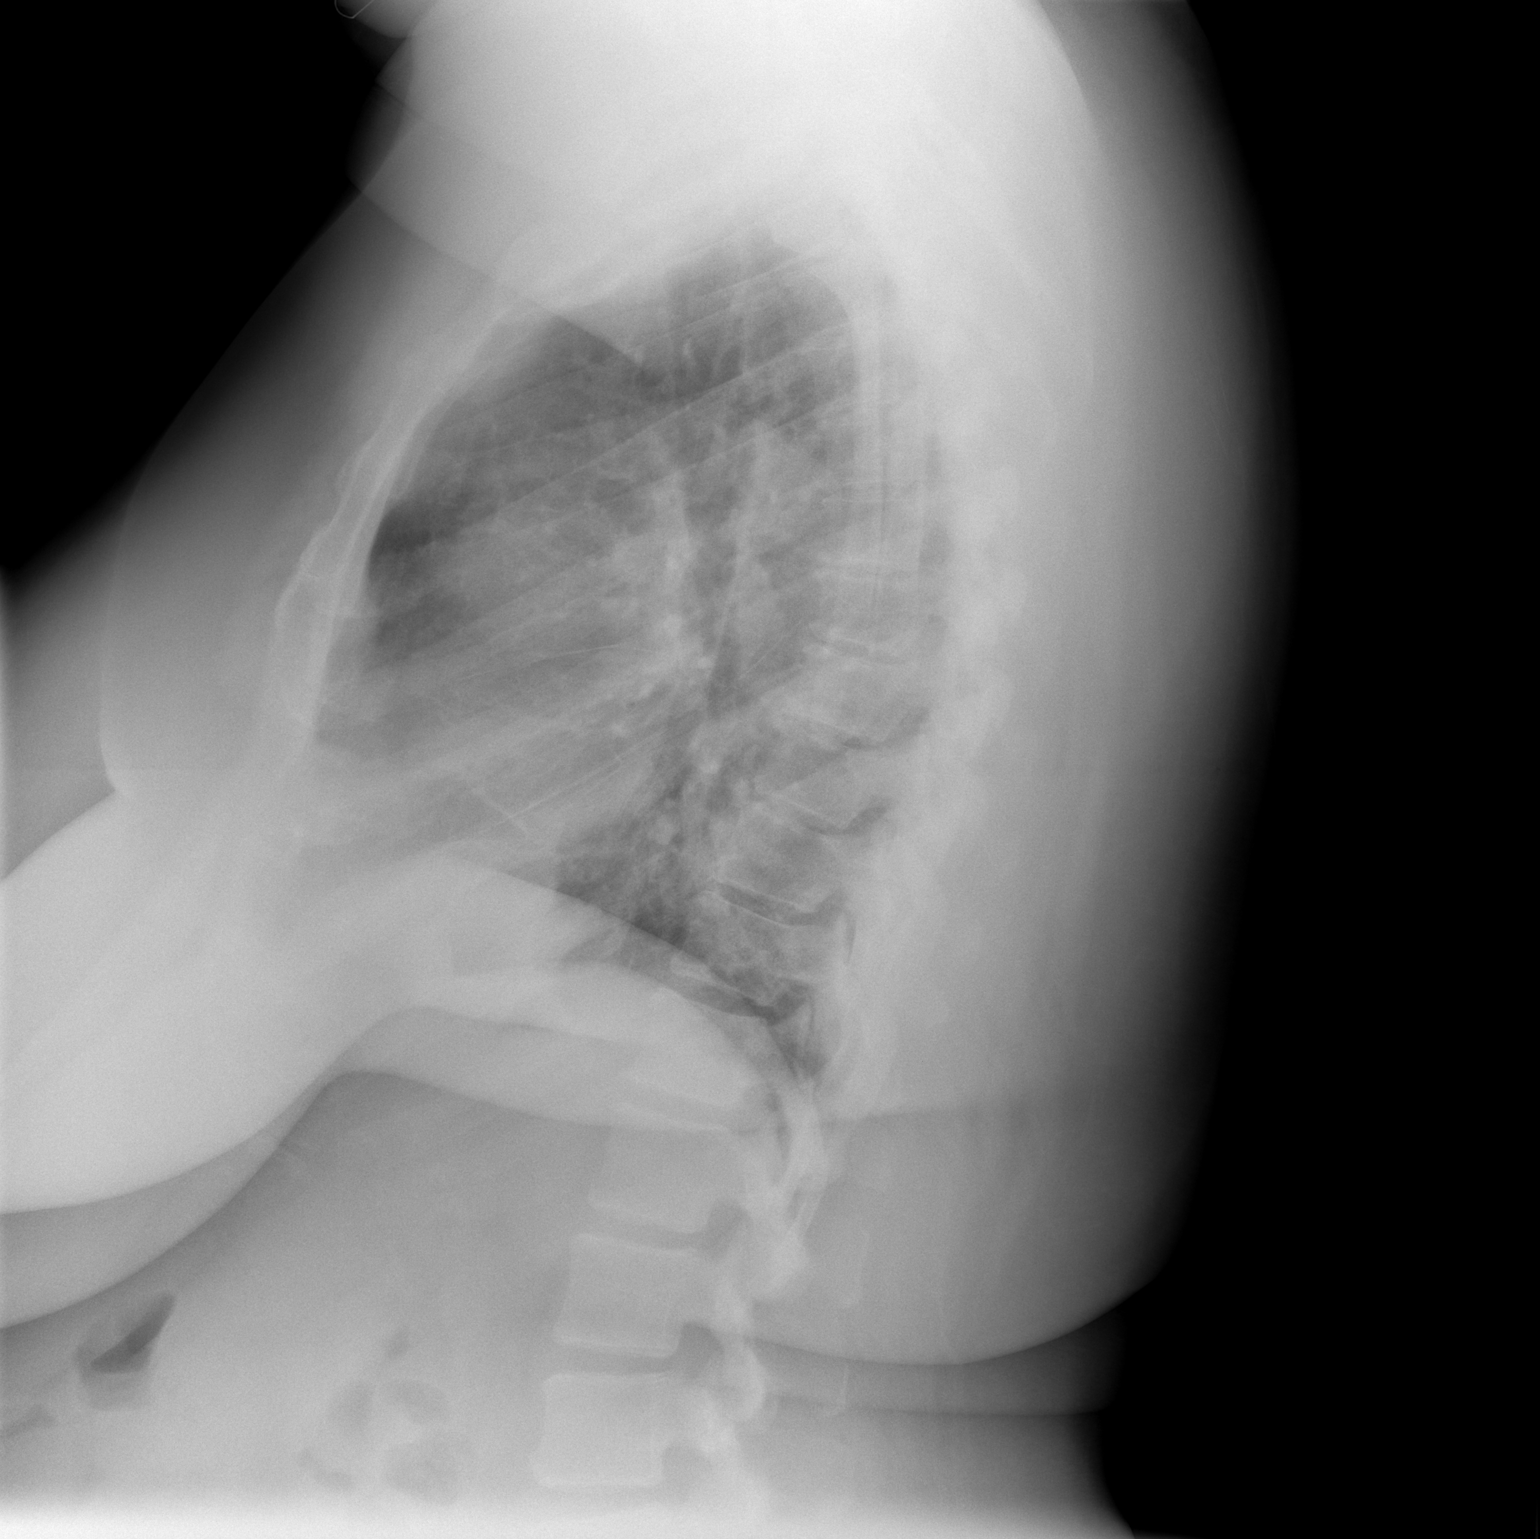

[2 of 2 positions shown; findings below may reference images not displayed]

FINDINGS: Mild cardiac enlargement.
Tortuous aorta.
Slight pulmonary vascular congestion.
Bronchitic changes with minimal bibasilar atelectasis or scarring.
No gross infiltrate or effusion.
Bones unremarkable.
IMPRESSION: Cardiomegaly.
Bronchitic changes with minimal bibasilar atelectasis versus
scarring.

## 2010-05-24 NOTE — Letter (Signed)
Summary: [facility]: Referral Form  [facility]: Referral Form   Imported By: Earl Many 12/23/2009 12:14:32  _____________________________________________________________________  External Attachment:    Type:   Image     Comment:   External Document

## 2010-05-24 NOTE — Progress Notes (Signed)
Summary: CV[facility]: [facility]  CV[facility]: [facility]   Imported By: Earl Many 12/23/2009 12:19:02  _____________________________________________________________________  External Attachment:    Type:   Image     Comment:   External Document

## 2010-05-24 NOTE — Letter (Signed)
Summary: Physician Documentation Sheet  Physician Documentation Sheet   Imported By: Earl Many 12/23/2009 12:05:50  _____________________________________________________________________  External Attachment:    Type:   Image     Comment:   External Document

## 2010-05-24 NOTE — Consult Note (Signed)
[facility]   Imported By: Earl Many 12/23/2009 12:17:06  _____________________________________________________________________  External Attachment:    Type:   Image     Comment:   External Document

## 2010-05-24 NOTE — Miscellaneous (Signed)
Summary: VIP  Patient: [name] Note: All result statuses are Final unless otherwise noted.  Tests: (1) VIP (Medications)   LLIMPORTMEDS              "Result Below..."       RESULT: ZITHROMAX TABS 250 MG*TAKE 2 TABLETS TODAY, THEN TAKE 1 TABLET  DAILY UNTIL GONE.*01/08/2007*Last Refill: Unknown*[id number]*******   LLIMPORTMEDS              "Result Below..."       RESULT: VENTOLIN HFA AERS 108 (90 Base) MCG/ACT*2 PUFFS EVERY 4-6 HOURS AS NEEDED*02/19/2007*Last Refill: [id number]*[id number]*******   LLIMPORTMEDS              "Result Below..."       RESULT: TRIXAICIN CREA 0.025 %*APPLY TO PAINFUL JOINTS AS DIRECTED*07/20/2006*Last Refill: 08/23/2006*[id number]*******   LLIMPORTMEDS              "Result Below..."       RESULT: TRIAMCINOLONE ACETONIDE CREA 0.1 %*APPLY TWICE DAILY TO AFFECTED  AREA*10/25/2006*Last Refill: Unknown*[id number]*******   LLIMPORTMEDS              "Result Below..."       RESULT: TRAMADOL HCL TABS 50 MG*TAKE ONE TABLET EVERY 6 HOURS AS NEEDED FOR  PAIN*11/16/2006*Last Refill: [id number]*******   LLIMPORTMEDS              "Result Below..."       RESULT: PROTONIX TBEC 40 MG*TAKE ONE (1) TABLET EACH DAY*02/19/2007*Last Refill: [id number]*******   LLIMPORTMEDS              "Result Below..."       RESULT: PREDNISONE TABS 5 MG*6 TABLETS ON DAY 1, 5 TABLETS ON DAY 2 4 TABS ON DAY 3,  3 TABS ON DAY 4, 2 TABLETS ON DAY 5, 1 TABLET ON DAY 6*01/16/2007*Last Refill: YQIHKVQ*[id number]*******   LLIMPORTMEDS              "Result Below..."       RESULT: PAXIL TABS 40 MG*TAKE ONE-HALF TABLET DAILY*11/16/2006*Last Refill: 01/08/2007*[id number]*******   LLIMPORTMEDS              "Result Below..."       RESULT: NASACORT AQ AERS 55 MCG/ACT*USE TWO SPRAYS IN EACH NOSTRIL EVERY NIGHT AT BEDTIME*06/26/2006*Last Refill: [id number]*[id number]*******   LLIMPORTMEDS              "Result Below..."       RESULT: NABUMETONE TABS 500 MG*TAKE ONE TABLET BY MOUTH TWICE DAILY AFTER MEALS FOR BACK/JOINT PAIN*06/26/2006*Last Refill:  12/11/2006*[id number]*******   LLIMPORTMEDS              "Result Below..."       RESULT: MUCINEX DM TB12 30-600 MG*TAKE ONE (1) TABLET BY MOUTH TWO (2) TIMES DAILY*01/16/2007*Last Refill: OACZYSA*[id number]*******   LLIMPORTMEDS              "Result Below..."       RESULT: LORATADINE TABS 10 MG*TAKE ONE (1) TABLET BY MOUTH EVERY DAY*10/25/2006*Last Refill: Unknown*[id number]*******   LLIMPORTMEDS              "Result Below..."       RESULT: LIPITOR TABS 20 MG*TAKE ONE (1) TABLET EACH DAY   GENE HAS ICP LIPITOR 20MG  0808*12/11/2006*Last Refill: 01/08/2007*[id number]*******   LLIMPORTMEDS              "Result Below..."       RESULT: ISOSORBIDE DINITRATE TABS 20 MG*TAKE ONE-HALF TABLET BY MOUTH TWICE DAILY*11/16/2006*Last Refill: 01/08/2007*[id number]*******   LLIMPORTMEDS              "  Result Below..."       RESULT: HYDROXYZINE HCL TABS 10 MG*TAKE ONE (1) TABLET THREE TIMES DAILY AS NEEDED FOR ITCHING*01/16/2007*Last Refill: ZOXWRUE*[id number]*******   LLIMPORTMEDS              "Result Below..."       RESULT: FLUOCINONIDE CREA 0.05 %*APPLY TO AFFECTED AREA ONCE A DAY. AFTER BATHING*12/11/2006*Last Refill: [id number]*******   LLIMPORTMEDS              "Result Below..."       RESULT: DIOVAN HCT TABS 160-12.5 MG*TAKE ONE (1) TABLET EACH DAY*11/16/2006*Last Refill: 01/08/2007*[id number]*******   LLIMPORTMEDS              "Result Below..."       RESULT: CYCLOBENZAPRINE HCL TABS 10 MG*TAKE ONE TABLET TWICE DAILY AS NEEDED FOR SPASMS AND PAIN  Generic for FLEXERIL 10MG  TAB*11/16/2006*Last Refill: AOZHYQM*[id number]*******   LLIMPORTMEDS              "Result Below..."       RESULT: BETAMETHASONE DIPROPIONATE CREA 0.05 %*APPLY TO AFFECTED AREAS THREE TIMES DAILY.*01/16/2007*Last Refill: [email or web address]*******   LLIMPORTMEDS              "Result Below..."       RESULT: AVELOX TABS 400 MG*TAKE 1 TABLET BY MOUTH EVERY DAY FOR 7 DAYS*01/16/2007*Last Refill: Unknown*[id number]*******   LLIMPORTALLS              "Result Below..."       RESULT:  PENICILLINS*[id number]**   LLIMPORTALLS              "Result Below..."       RESULT: SULFONAMIDES*[id number]**  Note: An exclamation mark (!) indicates a result that was not dispersed into the flowsheet. Document Creation Date: 02/21/2007 2:58 PM _______________________________________________________________________  (1) Order result status: Final Collection or observation date-time: 01/09/2007 Requested date-time: 01/09/2007 Receipt date-time:  Reported date-time: 01/09/2007 Referring Physician:   Ordering Physician:   Specimen Source:  Source: [place] Order Number:  Lab site:

## 2010-05-26 ENCOUNTER — Emergency Department (HOSPITAL_COMMUNITY): Payer: Medicare Other

## 2010-05-26 ENCOUNTER — Emergency Department (HOSPITAL_COMMUNITY)
Admission: EM | Admit: 2010-05-26 | Discharge: 2010-05-26 | Disposition: A | Discharge status: Home / Self Care | Payer: Medicare Other | Attending: Emergency Medicine | Admitting: Emergency Medicine

## 2010-05-26 DIAGNOSIS — Z79899 Other long term (current) drug therapy: Secondary | ICD-10-CM | POA: Insufficient documentation

## 2010-05-26 DIAGNOSIS — K219 Gastro-esophageal reflux disease without esophagitis: Secondary | ICD-10-CM | POA: Insufficient documentation

## 2010-05-26 DIAGNOSIS — F329 Major depressive disorder, single episode, unspecified: Secondary | ICD-10-CM | POA: Insufficient documentation

## 2010-05-26 DIAGNOSIS — J45909 Unspecified asthma, uncomplicated: Secondary | ICD-10-CM | POA: Insufficient documentation

## 2010-05-26 DIAGNOSIS — K089 Disorder of teeth and supporting structures, unspecified: Secondary | ICD-10-CM | POA: Insufficient documentation

## 2010-05-26 DIAGNOSIS — R42 Dizziness and giddiness: Secondary | ICD-10-CM | POA: Insufficient documentation

## 2010-05-26 DIAGNOSIS — R5381 Other malaise: Secondary | ICD-10-CM | POA: Insufficient documentation

## 2010-05-26 DIAGNOSIS — R079 Chest pain, unspecified: Secondary | ICD-10-CM | POA: Insufficient documentation

## 2010-05-26 DIAGNOSIS — F3289 Other specified depressive episodes: Secondary | ICD-10-CM | POA: Insufficient documentation

## 2010-05-26 DIAGNOSIS — Z86711 Personal history of pulmonary embolism: Secondary | ICD-10-CM | POA: Insufficient documentation

## 2010-05-26 DIAGNOSIS — R110 Nausea: Secondary | ICD-10-CM | POA: Insufficient documentation

## 2010-05-26 DIAGNOSIS — I10 Essential (primary) hypertension: Secondary | ICD-10-CM | POA: Insufficient documentation

## 2010-05-26 DIAGNOSIS — E7800 Pure hypercholesterolemia, unspecified: Secondary | ICD-10-CM | POA: Insufficient documentation

## 2010-05-26 DIAGNOSIS — E119 Type 2 diabetes mellitus without complications: Secondary | ICD-10-CM | POA: Insufficient documentation

## 2010-05-26 LAB — POCT CARDIAC MARKERS
CKMB, poc: 1.3 ng/mL (ref 1.0–8.0)
CKMB, poc: <1 ng/mL — ABNORMAL LOW (ref 1.0–8.0)
Myoglobin, poc: 66.8 ng/mL (ref 12–200)
Myoglobin, poc: 70.1 ng/mL (ref 12–200)
Troponin i, poc: <0.05 ng/mL (ref 0.00–0.09)
Troponin i, poc: <0.05 ng/mL (ref 0.00–0.09)

## 2010-05-26 LAB — URINALYSIS, ROUTINE W REFLEX MICROSCOPIC
Bilirubin Urine: NEGATIVE
Hgb urine dipstick: NEGATIVE
Ketones, ur: NEGATIVE mg/dL
Nitrite: NEGATIVE
Protein, ur: NEGATIVE mg/dL
Specific Gravity, Urine: 1.024 (ref 1.005–1.030)
Urine Glucose, Fasting: NEGATIVE mg/dL
Urobilinogen, UA: 0.2 mg/dL (ref 0.0–1.0)
pH: 5.5 (ref 5.0–8.0)

## 2010-05-26 LAB — CBC
HCT: 39.3 % (ref 36.0–46.0)
Hemoglobin: 13.3 g/dL (ref 12.0–15.0)
MCH: 28.9 pg (ref 26.0–34.0)
MCHC: 33.8 g/dL (ref 30.0–36.0)
MCV: 85.4 fL (ref 78.0–100.0)
Platelets: 254 10*3/uL (ref 150–400)
RBC: 4.6 MIL/uL (ref 3.87–5.11)
RDW: 13 % (ref 11.5–15.5)
WBC: 11.7 10*3/uL — ABNORMAL HIGH (ref 4.0–10.5)

## 2010-05-26 LAB — BASIC METABOLIC PANEL
BUN: 10 mg/dL (ref 6–23)
CO2: 27 mEq/L (ref 19–32)
Calcium: 9.2 mg/dL (ref 8.4–10.5)
Chloride: 100 mEq/L (ref 96–112)
Creatinine, Ser: 0.83 mg/dL (ref 0.4–1.2)
GFR calc Af Amer: >60 mL/min (ref >60)
GFR calc non Af Amer: >60 mL/min (ref >60)
Glucose, Bld: 153 mg/dL — ABNORMAL HIGH (ref 70–99)
Potassium: 4 mEq/L (ref 3.5–5.1)
Sodium: 136 mEq/L (ref 135–145)

## 2010-05-26 LAB — DIFFERENTIAL
Basophils Absolute: 0 10*3/uL (ref 0.0–0.1)
Basophils Relative: 0 % (ref 0–1)
Eosinophils Absolute: 0 10*3/uL (ref 0.0–0.7)
Eosinophils Relative: 0 % (ref 0–5)
Lymphocytes Relative: 14 % (ref 12–46)
Lymphs Abs: 1.6 10*3/uL (ref 0.7–4.0)
Monocytes Absolute: 0.5 10*3/uL (ref 0.1–1.0)
Monocytes Relative: 4 % (ref 3–12)
Neutro Abs: 9.6 10*3/uL — ABNORMAL HIGH (ref 1.7–7.7)
Neutrophils Relative %: 82 % — ABNORMAL HIGH (ref 43–77)

## 2010-05-26 LAB — GLUCOSE, CAPILLARY: Glucose-Capillary: 155 mg/dL — ABNORMAL HIGH (ref 70–99)

## 2010-05-27 ENCOUNTER — Emergency Department (HOSPITAL_COMMUNITY)
Admission: EM | Admit: 2010-05-27 | Discharge: 2010-05-28 | Disposition: A | Discharge status: Home / Self Care | Payer: Medicare Other | Attending: Emergency Medicine | Admitting: Emergency Medicine

## 2010-05-27 DIAGNOSIS — E119 Type 2 diabetes mellitus without complications: Secondary | ICD-10-CM | POA: Insufficient documentation

## 2010-05-27 DIAGNOSIS — R0789 Other chest pain: Secondary | ICD-10-CM | POA: Insufficient documentation

## 2010-05-28 ENCOUNTER — Encounter (HOSPITAL_COMMUNITY): Payer: Self-pay | Admitting: Radiology

## 2010-05-28 ENCOUNTER — Emergency Department (HOSPITAL_COMMUNITY): Payer: Medicare Other

## 2010-05-28 ENCOUNTER — Inpatient Hospital Stay (HOSPITAL_COMMUNITY)
Admission: EM | Admit: 2010-05-28 | Discharge: 2010-05-31 | DRG: 313 | Disposition: A | Discharge status: Home / Self Care | Payer: Medicare Other | Attending: Internal Medicine | Admitting: Internal Medicine

## 2010-05-28 DIAGNOSIS — Z7982 Long term (current) use of aspirin: Secondary | ICD-10-CM

## 2010-05-28 DIAGNOSIS — I10 Essential (primary) hypertension: Secondary | ICD-10-CM | POA: Diagnosis present

## 2010-05-28 DIAGNOSIS — R0789 Other chest pain: Principal | ICD-10-CM | POA: Diagnosis present

## 2010-05-28 DIAGNOSIS — E669 Obesity, unspecified: Secondary | ICD-10-CM | POA: Diagnosis present

## 2010-05-28 DIAGNOSIS — K047 Periapical abscess without sinus: Secondary | ICD-10-CM | POA: Diagnosis present

## 2010-05-28 DIAGNOSIS — E119 Type 2 diabetes mellitus without complications: Secondary | ICD-10-CM | POA: Diagnosis present

## 2010-05-28 DIAGNOSIS — E785 Hyperlipidemia, unspecified: Secondary | ICD-10-CM | POA: Diagnosis present

## 2010-05-28 DIAGNOSIS — G4733 Obstructive sleep apnea (adult) (pediatric): Secondary | ICD-10-CM | POA: Diagnosis present

## 2010-05-28 HISTORY — DX: Essential (primary) hypertension: I10

## 2010-05-28 LAB — POCT I-STAT, CHEM 8
BUN: 6 mg/dL (ref 6–23)
Calcium, Ion: 1.11 mmol/L — ABNORMAL LOW (ref 1.12–1.32)
Chloride: 99 mEq/L (ref 96–112)
Creatinine, Ser: 0.9 mg/dL (ref 0.4–1.2)
Glucose, Bld: 139 mg/dL — ABNORMAL HIGH (ref 70–99)
HCT: 39 % (ref 36.0–46.0)
Hemoglobin: 13.3 g/dL (ref 12.0–15.0)
Potassium: 3.7 mEq/L (ref 3.5–5.1)
Sodium: 137 mEq/L (ref 135–145)
TCO2: 29 mmol/L (ref 0–100)

## 2010-05-28 LAB — CBC
HCT: 39.1 % (ref 36.0–46.0)
Hemoglobin: 12.7 g/dL (ref 12.0–15.0)
MCH: 28.6 pg (ref 26.0–34.0)
MCHC: 32.5 g/dL (ref 30.0–36.0)
MCV: 88.1 fL (ref 78.0–100.0)
Platelets: 225 10*3/uL (ref 150–400)
RBC: 4.44 MIL/uL (ref 3.87–5.11)
RDW: 13.2 % (ref 11.5–15.5)
WBC: 11.9 10*3/uL — ABNORMAL HIGH (ref 4.0–10.5)

## 2010-05-28 LAB — DIFFERENTIAL
Basophils Absolute: 0 10*3/uL (ref 0.0–0.1)
Basophils Relative: 0 % (ref 0–1)
Eosinophils Absolute: 0 10*3/uL (ref 0.0–0.7)
Eosinophils Relative: 0 % (ref 0–5)
Lymphocytes Relative: 21 % (ref 12–46)
Lymphs Abs: 2.5 10*3/uL (ref 0.7–4.0)
Monocytes Absolute: 1 10*3/uL (ref 0.1–1.0)
Monocytes Relative: 9 % (ref 3–12)
Neutro Abs: 8.3 10*3/uL — ABNORMAL HIGH (ref 1.7–7.7)
Neutrophils Relative %: 70 % (ref 43–77)

## 2010-05-28 LAB — TROPONIN I: Troponin I: 0.09 ng/mL — ABNORMAL HIGH (ref 0.00–0.06)

## 2010-05-28 LAB — D-DIMER, QUANTITATIVE: D-Dimer, Quant: 0.33 ug/mL-FEU (ref 0.00–0.48)

## 2010-05-28 LAB — CK TOTAL AND CKMB (NOT AT ARMC)
CK, MB: 0.9 ng/mL (ref 0.3–4.0)
Relative Index: INVALID (ref 0.0–2.5)
Total CK: 74 U/L (ref 7–177)

## 2010-05-28 LAB — POCT CARDIAC MARKERS
CKMB, poc: <1 ng/mL — ABNORMAL LOW (ref 1.0–8.0)
Myoglobin, poc: 82.9 ng/mL (ref 12–200)
Troponin i, poc: <0.05 ng/mL (ref 0.00–0.09)

## 2010-05-28 LAB — GLUCOSE, CAPILLARY: Glucose-Capillary: 130 mg/dL — ABNORMAL HIGH (ref 70–99)

## 2010-05-28 MED ORDER — IOHEXOL 300 MG/ML  SOLN
80.0000 mL | Freq: Once | INTRAMUSCULAR | Status: AC | PRN
  Administered 2010-05-28: 80 mL via INTRAVENOUS

## 2010-05-29 DIAGNOSIS — R072 Precordial pain: Secondary | ICD-10-CM

## 2010-05-29 DIAGNOSIS — R079 Chest pain, unspecified: Secondary | ICD-10-CM

## 2010-05-29 LAB — COMPREHENSIVE METABOLIC PANEL
ALT: 24 U/L (ref 0–35)
AST: 18 U/L (ref 0–37)
Albumin: 3.2 g/dL — ABNORMAL LOW (ref 3.5–5.2)
Alkaline Phosphatase: 56 U/L (ref 39–117)
BUN: 3 mg/dL — ABNORMAL LOW (ref 6–23)
CO2: 31 mEq/L (ref 19–32)
Calcium: 8.9 mg/dL (ref 8.4–10.5)
Chloride: 96 mEq/L (ref 96–112)
Creatinine, Ser: 0.81 mg/dL (ref 0.4–1.2)
GFR calc Af Amer: >60 mL/min (ref >60)
GFR calc non Af Amer: >60 mL/min (ref >60)
Glucose, Bld: 130 mg/dL — ABNORMAL HIGH (ref 70–99)
Potassium: 3.6 mEq/L (ref 3.5–5.1)
Sodium: 137 mEq/L (ref 135–145)
Total Bilirubin: 0.8 mg/dL (ref 0.3–1.2)
Total Protein: 6.8 g/dL (ref 6.0–8.3)

## 2010-05-29 LAB — CBC
HCT: 38.2 % (ref 36.0–46.0)
Hemoglobin: 12.2 g/dL (ref 12.0–15.0)
MCH: 28 pg (ref 26.0–34.0)
MCHC: 31.9 g/dL (ref 30.0–36.0)
MCV: 87.8 fL (ref 78.0–100.0)
Platelets: 226 10*3/uL (ref 150–400)
RBC: 4.35 MIL/uL (ref 3.87–5.11)
RDW: 13.2 % (ref 11.5–15.5)
WBC: 10.3 10*3/uL (ref 4.0–10.5)

## 2010-05-29 LAB — GLUCOSE, CAPILLARY
Glucose-Capillary: 122 mg/dL — ABNORMAL HIGH (ref 70–99)
Glucose-Capillary: 130 mg/dL — ABNORMAL HIGH (ref 70–99)
Glucose-Capillary: 112 mg/dL — ABNORMAL HIGH (ref 70–99)

## 2010-05-29 LAB — CK TOTAL AND CKMB (NOT AT ARMC)
CK, MB: 0.9 ng/mL (ref 0.3–4.0)
CK, MB: 1.1 ng/mL (ref 0.3–4.0)
Relative Index: INVALID (ref 0.0–2.5)
Relative Index: INVALID (ref 0.0–2.5)
Total CK: 70 U/L (ref 7–177)
Total CK: 81 U/L (ref 7–177)

## 2010-05-29 LAB — TROPONIN I
Troponin I: 0.09 ng/mL — ABNORMAL HIGH (ref 0.00–0.06)
Troponin I: 0.09 ng/mL — ABNORMAL HIGH (ref 0.00–0.06)

## 2010-05-29 LAB — MAGNESIUM: Magnesium: 2.1 mg/dL (ref 1.5–2.5)

## 2010-05-30 ENCOUNTER — Inpatient Hospital Stay (HOSPITAL_COMMUNITY): Payer: Medicare Other

## 2010-05-30 LAB — GLUCOSE, CAPILLARY
Glucose-Capillary: 111 mg/dL — ABNORMAL HIGH (ref 70–99)
Glucose-Capillary: 119 mg/dL — ABNORMAL HIGH (ref 70–99)
Glucose-Capillary: 114 mg/dL — ABNORMAL HIGH (ref 70–99)
Glucose-Capillary: 147 mg/dL — ABNORMAL HIGH (ref 70–99)

## 2010-05-31 LAB — HEMOGLOBIN A1C
Hgb A1c MFr Bld: 6.6 % — ABNORMAL HIGH (ref <5.7)
Mean Plasma Glucose: 143 mg/dL — ABNORMAL HIGH (ref <117)

## 2010-05-31 LAB — COMPREHENSIVE METABOLIC PANEL
ALT: 65 U/L — ABNORMAL HIGH (ref 0–35)
AST: 45 U/L — ABNORMAL HIGH (ref 0–37)
Albumin: 3 g/dL — ABNORMAL LOW (ref 3.5–5.2)
Alkaline Phosphatase: 69 U/L (ref 39–117)
BUN: 6 mg/dL (ref 6–23)
CO2: 30 mEq/L (ref 19–32)
Calcium: 9.3 mg/dL (ref 8.4–10.5)
Chloride: 99 mEq/L (ref 96–112)
Creatinine, Ser: 0.86 mg/dL (ref 0.4–1.2)
GFR calc Af Amer: >60 mL/min (ref >60)
GFR calc non Af Amer: >60 mL/min (ref >60)
Glucose, Bld: 125 mg/dL — ABNORMAL HIGH (ref 70–99)
Potassium: 4.1 mEq/L (ref 3.5–5.1)
Sodium: 139 mEq/L (ref 135–145)
Total Bilirubin: 0.5 mg/dL (ref 0.3–1.2)
Total Protein: 6.5 g/dL (ref 6.0–8.3)

## 2010-05-31 LAB — CARDIAC PANEL(CRET KIN+CKTOT+MB+TROPI)
CK, MB: 1.1 ng/mL (ref 0.3–4.0)
Relative Index: INVALID (ref 0.0–2.5)
Total CK: 58 U/L (ref 7–177)
Troponin I: 0.07 ng/mL — ABNORMAL HIGH (ref 0.00–0.06)

## 2010-05-31 LAB — CBC
HCT: 37 % (ref 36.0–46.0)
Hemoglobin: 12 g/dL (ref 12.0–15.0)
MCH: 28 pg (ref 26.0–34.0)
MCHC: 32.4 g/dL (ref 30.0–36.0)
MCV: 86.2 fL (ref 78.0–100.0)
Platelets: 266 10*3/uL (ref 150–400)
RBC: 4.29 MIL/uL (ref 3.87–5.11)
RDW: 13 % (ref 11.5–15.5)
WBC: 6.9 10*3/uL (ref 4.0–10.5)

## 2010-05-31 LAB — GLUCOSE, CAPILLARY
Glucose-Capillary: 114 mg/dL — ABNORMAL HIGH (ref 70–99)
Glucose-Capillary: 100 mg/dL — ABNORMAL HIGH (ref 70–99)

## 2010-06-04 LAB — CULTURE, BLOOD (ROUTINE X 2)
Culture  Setup Time: 201202050047
Culture  Setup Time: 201202050047
Culture: NO GROWTH
Culture: NO GROWTH

## 2010-06-04 NOTE — Consult Note (Signed)
[name], Barrera NO.:  [id number]  MEDICAL RECORD NO.:  [id number]           PATIENT TYPE:  I  LOCATION:  2031                         FACILITY:  [facility]  PHYSICIAN:  Madolyn Frieze. Jens Som, MD, FACCDATE OF BIRTH:  1960-06-07  DATE OF CONSULTATION:  05/29/2010 DATE OF DISCHARGE:                                CONSULTATION   The patient is a 50 year old female with past medical history of diet- controlled diabetes mellitus, hypertension, obstructive sleep apnea who I am asked to evaluate for chest pain.  The patient did undergo cardiac catheterization in December 2008.  At that time, she was found to have no coronary artery disease and normal LV function.  She apparently has had intermittent chest pain for years.  She was treated in January 2008 with Coumadin for 6 months for a question of pulmonary embolus on CT. She did have her last CTA in October 2011, which showed no pulmonary embolus.  She was admitted on May 28, 2010, with complaints of left facial pain and swelling following tooth extraction as well as chest pain and shortness of breath.  Because of her chest pain, Cardiology is asked to further evaluate.  She again has had chest pain intermittently for years.  The pain is in the substernal area and under her left breast.  It does not radiate.  There is associated shortness of breath, nausea, and diaphoresis intermittently, but this is not 100% of the time.  It lasts approximately 20 minutes and resolves spontaneously. The pain can increase with inspiration, lying flat, and movements.  It can occur with either exertion or at rest.  Note, she did have tooth extraction 3 days prior to admission and developed swelling of her left face and also difficulty swallowing and some shortness of breath.  That was the predominant reason that she presented to the emergency room. Her medications at present include subcutaneous heparin, potassium 10 mEq p.o. daily,  amlodipine 10 mg daily, Imdur 20 mg p.o. daily, Lipitor 20 mg p.o. daily, aspirin 80 mg p.o. daily, and clindamycin.  She has allergies to PENICILLIN and SULFA.  SOCIAL HISTORY:  She has remote history of tobacco use but has not smoked in 20 years.  She does not consume alcohol.  Family history is positive for coronary artery disease in her father.  PAST MEDICAL HISTORY:  Significant for diabetes.  This is diet controlled.  She also has hypertension and hyperlipidemia.  She has a history of obstructive sleep apnea.  She also has a history of depression.  She has had prior tonsillectomy, exploratory laparotomy as well as hysterectomy.  She has had a previous question of pulmonary embolus in January 2008 that was treated with Coumadin.  She also has arthritis.  REVIEW OF SYSTEMS:  She denies any headaches.  She states she did have a fever on admission.  She had chills.  There is no productive cough or hemoptysis.  There has been dysphagia related to her facial swelling. There is no melena or hematochezia.  There is no dysuria or hematuria. There is no rash or seizure activity.  She has had some difficulty breathing related to  her facial swelling.  Remaining systems are negative.  PHYSICAL EXAMINATION:  VITAL SIGNS:  Today, blood pressure of 142/86, pulse of 86, temperature is 99. GENERAL:  She is well developed and obese.  She is in no acute distress. SKIN:  Warm and dry.  She does not appear to be depressed.  There is no peripheral clubbing. BACK:  Normal. HEENT:  Significant for marked edema and swelling as well as tenderness to palpation over the left facial area.  There is also tenderness in the submandibular area. NECK:  Supple with no bruits.  There is no jugular venous distention. No thyromegaly. CHEST:  Clear to auscultation.  Normal expansion. CARDIOVASCULAR:  Regular rhythm with a normal S1 and S2.  I cannot appreciate murmurs, rubs, or gallops. ABDOMEN:  Nontender and  nondistended.  Positive bowel sounds.  No hepatosplenomegaly.  No mass is appreciated.  There is no abdominal bruit.  She has 2+ femoral pulses bilaterally.  No bruits. EXTREMITIES:  No edema.  I can palpate no cords.  She has 2+ dorsalis pedis pulses bilaterally. NEUROLOGIC:  Grossly intact.  LABORATORY DATA:  Troponin I of 0.09, 0.09, and 0.09.  Her magnesium is 2.1.  BUN and creatinine are 3 and 0.81.  Liver functions are normal. White blood cell count is 10.3 with a hemoglobin count of 12.2, hematocrit 38.23, platelet count is 226.  A D-dimer is 0.33.  Chest x- ray shows borderline cardiomegaly and bibasilar subsegmental atelectasis.  CT of her face shows recent extraction of tooth #16. There is fluid in air/gas within the extractions extraction socket. There is inflammation of the adjacent muscles and nonspecific inflammation.  There is no drainable abscess.  Her electrocardiogram shows a sinus rhythm with no ST changes.  On admission, there were mild inferolateral T-wave changes.  DIAGNOSES: 1. Chest pain - the patient's symptoms are extremely atypical.  Also     note that her troponin is nondiagnostic as there is no clear     elevation.  Her D-dimer is normal at 0.33.  I do not recommend     further workup at this point.  I would treat her facial     inflammation/infection.  When she has improved, then we could     discharge, and she can have an outpatient Myoview for risk     stratification given her multiple risk factors. 2. Diet-controlled diabetes mellitus - continue diet and follow CBGs. 3. Hypertension - she will continue on her present blood pressure     medications. 4. Hyperlipidemia - she will continue statin. 5. Obstructive sleep apnea.     Madolyn Frieze Jens Som, MD, [facility]     BSC/MEDQ  D:  05/29/2010  T:  05/30/2010  Job:  [id number]  Electronically Signed by Olga Millers MD [facility] on 05/30/2010 08:11:12 AM

## 2010-06-05 NOTE — H&P (Signed)
[name], [name]           ACCOUNT NO.:  [id number]  MEDICAL RECORD NO.:  [id number]           PATIENT TYPE:  E  LOCATION:  [facility]                         FACILITY:  [facility]  PHYSICIAN:  Daanish Copes I Caly Pellum, MD      DATE OF BIRTH:  [birth year]  DATE OF ADMISSION:  05/28/2010 DATE OF DISCHARGE:                             HISTORY & PHYSICAL   PRIMARY CARE PHYSICIAN:  Maurice March, MD  CHIEF COMPLAINT:  Chest pain for 3 days and left facial pain and swelling with neck pain since Thursday.  HISTORY OF PRESENT ILLNESS:  This is a 50 year old female with history of diabetes mellitus on diet control, obesity, and hypertension.  She was recently diagnosed with tooth abscess status post 3 teeth extraction on her left side and 1 on her right side.  This teeth extraction was done on Thursday of this week.  Since then, the patient continued to complain of left facial side weakness associated with neck pain and swelling on her left cheek.  She presented twice to the emergency room complaining of left side chest pain and this is dental and facial pain. She was diagnosed as dental abscess and the patient was prescribed clindamycin p.o. in addition to oxycodone and Ultracet.  Since then, the patient's symptoms continued to get worse, the swelling on her face progressively worsening.  The patient unable to eat regular food.  Her chest pain started after the dental extraction.  Pain on her left retrosternal and radiate to her back associated with shortness of breath and pain progressively worse when taking a deep breath.  Also with any neck movement, the patient complained of chest pain.  The patient admitted there is no relieving factors.  The patient apparently has chest pain previously and has cardiac cath which was done on 2008. Apparently, it did show normal coronary artery.  No significant atherosclerotic coronary artery disease with normal left ventricular function.  Here, first set  of cardiac enzyme on the emergency room was negative.  She also have a cardiac marker done on May 26, 2010, which was also negative.  Currently, the patient complaining of very severe pain on her head, neck, and face in addition to chest pain.  The patient denies any sweating, denies any palpitation.  On the emergency room, the patient have a blood pressure of 161/86 and pulse rate 90, respiratory rate of 27, and 96% on room air.  PAST MEDICAL HISTORY: 1. Significant for diabetes mellitus, currently on diet control and     exercise.  She is not on any oral hypoglycemic or insulin. 2. Hypertension and hyperlipidemia. 3. Obstructive sleep apnea. 4. Depression.  PAST SURGICAL HISTORY:  Tonsillectomy.  MEDICATIONS:  Medications which will be reconciled by pharmacy.  The patient on, 1. Imdur 20 mg p.o. daily. 2. Aspirin 81 mg daily. 3. Lipitor 20 mg. 4. Oxycodone/aspirin. 5. Tramadol/acetaminophen. 6. Clindamycin p.o. 7. Norvasc 5 mg p.o. daily. 8. Mirtazapine 15 mg. 9. Potassium chloride 10 mEq p.o. daily.  ALLERGIES: 1. HYDROCODONE. 2. PENICILLIN. 3. SULFA.  FAMILY HISTORY:  History of heart attack in father around age 53.  SOCIAL HISTORY:  The  patient denies any smoking.  Denies any alcohol. Denies any illicit drug abuse and she lives with her daughter.  REVIEW OF SYSTEMS:  The patient denies any fever, complain of headache and blurring of vision.  Admitted her left facial swelling progressively getting worse and now up to her neck and her eyes.  There is some smelly phlegm coming from her mouth with distinct smelling and distinct taste. Denies any coughing.  Denies any palpitation, but complained of left chest pain which radiate to her back and some shortness of breath. GASTROINTESTINAL:  Denies any nausea, vomiting, or change in her bowel habit.  UROLOGY:  Denies any dysuria or burning micturation.  LOWER EXTREMITIES:  Denies any lower extremity edema.  PHYSICAL  EXAMINATION:  GENERAL:  The patient look in pain. VITAL SIGNS:  Temperature 97.8, blood pressure 161/86, pulse is 90, respiratory 20, and sats 96% on room air. HEENT:  As I mentioned, the patient in pain, but not in respiratory distress.  No shortness of breath.  She has some pre-orbital erythema at the left eye.  There is no nystagmus and pupils are equal and reactive to light and accommodation.  There is obvious left facial swelling and tried to examine the mouth, there is limitation on mouth opening.  I do not see any obvious discharge from the buccal cavity. NECK:  Tender to palpation, mainly on the left, but there is no evidence of cold.  No thyromegaly.  No masses.  No obvious abscess on the patient's neck. HEART:  S1, S2 with no added sound. LUNGS:  Normal vesicular breathing with equal air entry. ABDOMEN:  Soft and nontender.  Bowel sounds positive. EXTREMITIES:  Without lower extremity edema.  Peripheral pulses intact. Hoffmann sign was negative.  LABORATORY DATA:  Blood work did show cardiac markers x1 negative.  CBC: WBC 11.9, hemoglobin 12.7, hematocrit 39.1, and platelets 225. Chemistry; the patient's sodium 137, potassium 3.7, chloride 99, glucose 139, BUN 16, and creatinine 0.9.  IMAGING DATA:  EKG, sinus rhythm with some right axis deviation and there is some T-wave inversion on lead aVF and lead III and II, which is also on V6, which is same different from the EKG in October 2011. Unfortunately, we are not able to maintain the EKG done on the last 2 days.  ASSESSMENT/PLAN: 1. Left facial swelling status post 3 teeth extraction most probably     secondary to formation of abscess. 2. The patient admitted worsening of the patient's symptoms with oral     clindamycin.  I will proceed with CT maxillofacial and CT orbit.     We will place the patient on Unasyn.  We will also get set of blood     culture.  The patient did not seem septic currently. 3. Chest pain, which  look atypical to me, but the EKG did show     significant T-wave inversion on lead III and aVF and V6.  I will     repeat the patient's EKG.  We will place the patient on aspirin,     beta-blocker and we will cover the patient with morphine IV.  We     will also get the patient d-dimer.  As mentioned, the patient has     recent cardiac cath in 2008, which was normal coronaries.  We will     consider consulting Cardiology if repeat EKG did show the same     changes.  We will continue cyclic cardiac enzymes and admit the  patient to telemetry.  We will continue with Imdur, aspirin, and     beta-blocker. 4. Hypertension.  We will start the patient on Norvasc and the     patient's home medications. 5. Diabetes.  The patient is not on any oral hypoglycemics, we will     get hemoglobin A1c and place the patient on insulin sliding scale. 6. DVT and GI prophylaxis. 7. Further recommendations as the hospital course progress.     Chrystle Murillo Bosie Helper, MD     HIE/MEDQ  D:  05/28/2010  T:  05/28/2010  Job:  [id number]  cc:   Maurice March, M.D.  Electronically Signed by Ebony Cargo MD on 06/03/2010 06:56:47 PM

## 2010-06-14 ENCOUNTER — Inpatient Hospital Stay (INDEPENDENT_AMBULATORY_CARE_PROVIDER_SITE_OTHER)
Admission: RE | Admit: 2010-06-14 | Discharge: 2010-06-14 | Disposition: A | Discharge status: Home / Self Care | Payer: Medicare Other | Source: Ambulatory Visit | Attending: Family Medicine | Admitting: Family Medicine

## 2010-06-14 DIAGNOSIS — K1370 Unspecified lesions of oral mucosa: Secondary | ICD-10-CM

## 2010-06-14 DIAGNOSIS — R51 Headache: Secondary | ICD-10-CM

## 2010-07-06 LAB — POCT CARDIAC MARKERS
CKMB, poc: <1 ng/mL — ABNORMAL LOW (ref 1.0–8.0)
Myoglobin, poc: 38.1 ng/mL (ref 12–200)
Troponin i, poc: <0.05 ng/mL (ref 0.00–0.09)

## 2010-07-06 LAB — DIFFERENTIAL
Basophils Absolute: 0 10*3/uL (ref 0.0–0.1)
Basophils Absolute: 0 10*3/uL (ref 0.0–0.1)
Basophils Relative: 0 % (ref 0–1)
Basophils Relative: 1 % (ref 0–1)
Eosinophils Absolute: 0.2 10*3/uL (ref 0.0–0.7)
Eosinophils Absolute: 0.2 10*3/uL (ref 0.0–0.7)
Eosinophils Relative: 2 % (ref 0–5)
Eosinophils Relative: 3 % (ref 0–5)
Lymphocytes Relative: 42 % (ref 12–46)
Lymphocytes Relative: 35 % (ref 12–46)
Lymphs Abs: 3.6 10*3/uL (ref 0.7–4.0)
Lymphs Abs: 2 10*3/uL (ref 0.7–4.0)
Monocytes Absolute: 0.5 10*3/uL (ref 0.1–1.0)
Monocytes Absolute: 0.5 10*3/uL (ref 0.1–1.0)
Monocytes Relative: 5 % (ref 3–12)
Monocytes Relative: 9 % (ref 3–12)
Neutro Abs: 4.3 10*3/uL (ref 1.7–7.7)
Neutro Abs: 3.1 10*3/uL (ref 1.7–7.7)
Neutrophils Relative %: 50 % (ref 43–77)
Neutrophils Relative %: 53 % (ref 43–77)

## 2010-07-06 LAB — CBC
HCT: 40 % (ref 36.0–46.0)
HCT: 38.3 % (ref 36.0–46.0)
Hemoglobin: 13.1 g/dL (ref 12.0–15.0)
Hemoglobin: 12.7 g/dL (ref 12.0–15.0)
MCH: 28.9 pg (ref 26.0–34.0)
MCH: 29.2 pg (ref 26.0–34.0)
MCHC: 32.8 g/dL (ref 30.0–36.0)
MCHC: 33.2 g/dL (ref 30.0–36.0)
MCV: 88.1 fL (ref 78.0–100.0)
MCV: 88 fL (ref 78.0–100.0)
Platelets: 234 10*3/uL (ref 150–400)
Platelets: 211 10*3/uL (ref 150–400)
RBC: 4.54 MIL/uL (ref 3.87–5.11)
RBC: 4.35 MIL/uL (ref 3.87–5.11)
RDW: 13.4 % (ref 11.5–15.5)
RDW: 13 % (ref 11.5–15.5)
WBC: 8.6 10*3/uL (ref 4.0–10.5)
WBC: 5.9 10*3/uL (ref 4.0–10.5)

## 2010-07-06 LAB — BASIC METABOLIC PANEL
BUN: 5 mg/dL — ABNORMAL LOW (ref 6–23)
CO2: 28 mEq/L (ref 19–32)
Calcium: 9.2 mg/dL (ref 8.4–10.5)
Chloride: 105 mEq/L (ref 96–112)
Creatinine, Ser: 0.79 mg/dL (ref 0.4–1.2)
GFR calc Af Amer: >60 mL/min (ref >60)
GFR calc non Af Amer: >60 mL/min (ref >60)
Glucose, Bld: 106 mg/dL — ABNORMAL HIGH (ref 70–99)
Potassium: 3.3 mEq/L — ABNORMAL LOW (ref 3.5–5.1)
Sodium: 140 mEq/L (ref 135–145)

## 2010-07-09 LAB — GLUCOSE, CAPILLARY: Glucose-Capillary: 90 mg/dL (ref 70–99)

## 2010-07-09 LAB — BASIC METABOLIC PANEL
BUN: 6 mg/dL (ref 6–23)
CO2: 28 mEq/L (ref 19–32)
Calcium: 9.4 mg/dL (ref 8.4–10.5)
Chloride: 98 mEq/L (ref 96–112)
Creatinine, Ser: 0.74 mg/dL (ref 0.4–1.2)
GFR calc Af Amer: >60 mL/min (ref >60)
GFR calc non Af Amer: >60 mL/min (ref >60)
Glucose, Bld: 121 mg/dL — ABNORMAL HIGH (ref 70–99)
Potassium: 3.8 mEq/L (ref 3.5–5.1)
Sodium: 133 mEq/L — ABNORMAL LOW (ref 135–145)

## 2010-07-09 LAB — DIFFERENTIAL
Basophils Absolute: 0.1 10*3/uL (ref 0.0–0.1)
Basophils Absolute: 0.1 10*3/uL (ref 0.0–0.1)
Basophils Relative: 1 % (ref 0–1)
Basophils Relative: 1 % (ref 0–1)
Eosinophils Absolute: 0.2 10*3/uL (ref 0.0–0.7)
Eosinophils Absolute: 0.2 10*3/uL (ref 0.0–0.7)
Eosinophils Relative: 2 % (ref 0–5)
Eosinophils Relative: 2 % (ref 0–5)
Lymphocytes Relative: 43 % (ref 12–46)
Lymphocytes Relative: 38 % (ref 12–46)
Lymphs Abs: 3.6 10*3/uL (ref 0.7–4.0)
Lymphs Abs: 3.7 10*3/uL (ref 0.7–4.0)
Monocytes Absolute: 0.7 10*3/uL (ref 0.1–1.0)
Monocytes Absolute: 0.7 10*3/uL (ref 0.1–1.0)
Monocytes Relative: 8 % (ref 3–12)
Monocytes Relative: 8 % (ref 3–12)
Neutro Abs: 3.8 10*3/uL (ref 1.7–7.7)
Neutro Abs: 4.9 10*3/uL (ref 1.7–7.7)
Neutrophils Relative %: 45 % (ref 43–77)
Neutrophils Relative %: 51 % (ref 43–77)

## 2010-07-09 LAB — COMPREHENSIVE METABOLIC PANEL
ALT: 34 U/L (ref 0–35)
AST: 22 U/L (ref 0–37)
Albumin: 3.8 g/dL (ref 3.5–5.2)
Alkaline Phosphatase: 44 U/L (ref 39–117)
BUN: 6 mg/dL (ref 6–23)
CO2: 28 mEq/L (ref 19–32)
Calcium: 9.5 mg/dL (ref 8.4–10.5)
Chloride: 104 mEq/L (ref 96–112)
Creatinine, Ser: 0.87 mg/dL (ref 0.4–1.2)
GFR calc Af Amer: >60 mL/min (ref >60)
GFR calc non Af Amer: >60 mL/min (ref >60)
Glucose, Bld: 93 mg/dL (ref 70–99)
Potassium: 3.7 mEq/L (ref 3.5–5.1)
Sodium: 140 mEq/L (ref 135–145)
Total Bilirubin: 0.8 mg/dL (ref 0.3–1.2)
Total Protein: 7.3 g/dL (ref 6.0–8.3)

## 2010-07-09 LAB — POCT CARDIAC MARKERS
CKMB, poc: <1 ng/mL — ABNORMAL LOW (ref 1.0–8.0)
CKMB, poc: <1 ng/mL — ABNORMAL LOW (ref 1.0–8.0)
CKMB, poc: 1.1 ng/mL (ref 1.0–8.0)
Myoglobin, poc: 72.2 ng/mL (ref 12–200)
Myoglobin, poc: 83.8 ng/mL (ref 12–200)
Myoglobin, poc: 76.1 ng/mL (ref 12–200)
Troponin i, poc: <0.05 ng/mL (ref 0.00–0.09)
Troponin i, poc: <0.05 ng/mL (ref 0.00–0.09)
Troponin i, poc: <0.05 ng/mL (ref 0.00–0.09)

## 2010-07-09 LAB — CBC
HCT: 38.1 % (ref 36.0–46.0)
HCT: 41.6 % (ref 36.0–46.0)
Hemoglobin: 13 g/dL (ref 12.0–15.0)
Hemoglobin: 14.3 g/dL (ref 12.0–15.0)
MCH: 30.4 pg (ref 26.0–34.0)
MCHC: 34.1 g/dL (ref 30.0–36.0)
MCHC: 34.3 g/dL (ref 30.0–36.0)
MCV: 89.1 fL (ref 78.0–100.0)
MCV: 88.5 fL (ref 78.0–100.0)
Platelets: 208 10*3/uL (ref 150–400)
Platelets: 214 10*3/uL (ref 150–400)
RBC: 4.27 MIL/uL (ref 3.87–5.11)
RBC: 4.7 MIL/uL (ref 3.87–5.11)
RDW: 13.2 % (ref 11.5–15.5)
RDW: 13.4 % (ref 11.5–15.5)
WBC: 8.3 10*3/uL (ref 4.0–10.5)
WBC: 9.6 10*3/uL (ref 4.0–10.5)

## 2010-07-09 LAB — D-DIMER, QUANTITATIVE: D-Dimer, Quant: 0.76 ug/mL-FEU — ABNORMAL HIGH (ref 0.00–0.48)

## 2010-07-12 LAB — COMPREHENSIVE METABOLIC PANEL
ALT: 37 U/L — ABNORMAL HIGH (ref 0–35)
AST: 27 U/L (ref 0–37)
Albumin: 3.7 g/dL (ref 3.5–5.2)
Alkaline Phosphatase: 57 U/L (ref 39–117)
BUN: 10 mg/dL (ref 6–23)
CO2: 26 mEq/L (ref 19–32)
Calcium: 8.8 mg/dL (ref 8.4–10.5)
Chloride: 105 mEq/L (ref 96–112)
Creatinine, Ser: 0.73 mg/dL (ref 0.4–1.2)
GFR calc Af Amer: >60 mL/min (ref >60)
GFR calc non Af Amer: >60 mL/min (ref >60)
Glucose, Bld: 143 mg/dL — ABNORMAL HIGH (ref 70–99)
Potassium: 3.5 mEq/L (ref 3.5–5.1)
Sodium: 136 mEq/L (ref 135–145)
Total Bilirubin: 0.7 mg/dL (ref 0.3–1.2)
Total Protein: 7.2 g/dL (ref 6.0–8.3)

## 2010-07-12 LAB — URINALYSIS, ROUTINE W REFLEX MICROSCOPIC
Bilirubin Urine: NEGATIVE
Glucose, UA: NEGATIVE mg/dL
Hgb urine dipstick: NEGATIVE
Ketones, ur: NEGATIVE mg/dL
Nitrite: NEGATIVE
Protein, ur: NEGATIVE mg/dL
Specific Gravity, Urine: 1.031 — ABNORMAL HIGH (ref 1.005–1.030)
Urobilinogen, UA: 0.2 mg/dL (ref 0.0–1.0)
pH: 6 (ref 5.0–8.0)

## 2010-07-12 LAB — CBC
HCT: 38.5 % (ref 36.0–46.0)
Hemoglobin: 13.2 g/dL (ref 12.0–15.0)
MCHC: 34.3 g/dL (ref 30.0–36.0)
MCV: 88.3 fL (ref 78.0–100.0)
Platelets: 205 10*3/uL (ref 150–400)
RBC: 4.35 MIL/uL (ref 3.87–5.11)
RDW: 13.5 % (ref 11.5–15.5)
WBC: 7.6 10*3/uL (ref 4.0–10.5)

## 2010-07-12 LAB — POCT CARDIAC MARKERS
CKMB, poc: 1.1 ng/mL (ref 1.0–8.0)
Myoglobin, poc: 65.5 ng/mL (ref 12–200)
Troponin i, poc: <0.05 ng/mL (ref 0.00–0.09)

## 2010-07-12 LAB — D-DIMER, QUANTITATIVE (NOT AT ARMC): D-Dimer, Quant: 0.4 ug/mL-FEU (ref 0.00–0.48)

## 2010-07-12 LAB — DIFFERENTIAL
Basophils Absolute: 0 K/uL (ref 0.0–0.1)
Basophils Relative: 1 % (ref 0–1)
Eosinophils Absolute: 0.1 K/uL (ref 0.0–0.7)
Eosinophils Relative: 1 % (ref 0–5)
Lymphocytes Relative: 37 % (ref 12–46)
Lymphs Abs: 2.8 K/uL (ref 0.7–4.0)
Monocytes Absolute: 0.4 K/uL (ref 0.1–1.0)
Monocytes Relative: 6 % (ref 3–12)
Neutro Abs: 4.3 K/uL (ref 1.7–7.7)
Neutrophils Relative %: 56 % (ref 43–77)

## 2010-07-12 LAB — LIPASE, BLOOD: Lipase: 27 U/L (ref 11–59)

## 2010-07-26 IMAGING — CT CT ANGIO CHEST
1 of 3 series · 18 of 31 positions shown · IV contrast (agent unspecified)
Comparison: Chest radiographs today.  Chest CT 12/18/2007.

CLINICAL DATA: Pain, congestion and back pain.  Question pulmonary
embolism.

CT ANGIOGRAPHY CHEST WITH CONTRAST
TECHNIQUE: Multidetector CT imaging of the chest was performed
using the standard protocol during bolus administration of
intravenous contrast. Multiplanar CT image reconstructions
including MIPs were obtained to evaluate the vascular anatomy.
Contrast: 100 ml Cmnipaque-FKK intravenously.

[Series 3: pe · axial · 0.86mm/px · z∈[-256,-21]mm · 18 of 214 slices shown]
[im 13/214  lung]
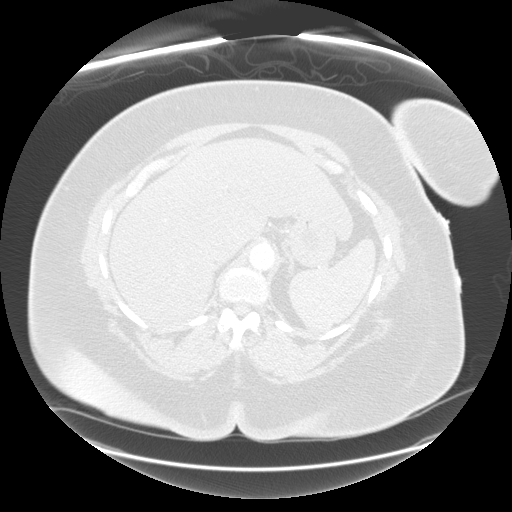
[im 26/214  mediastinal]
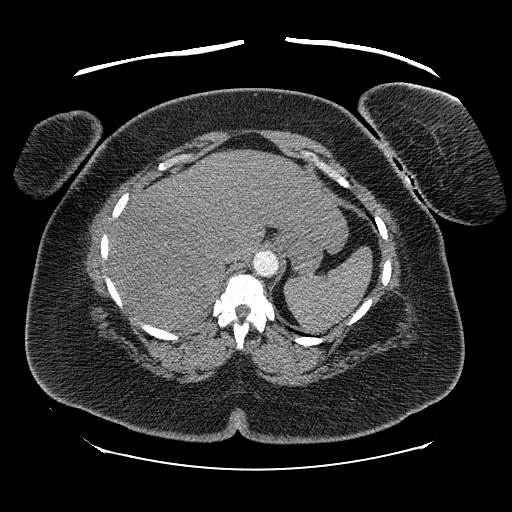
[im 38/214  lung]
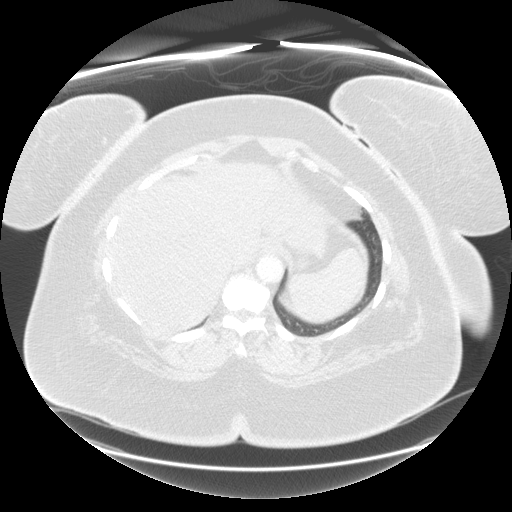
[im 51/214  mediastinal]
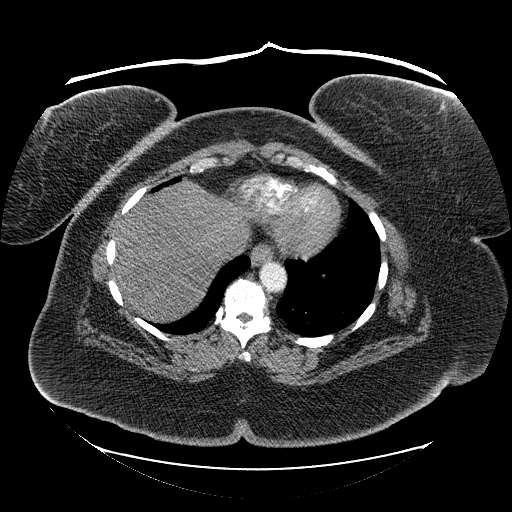
[im 63/214  lung]
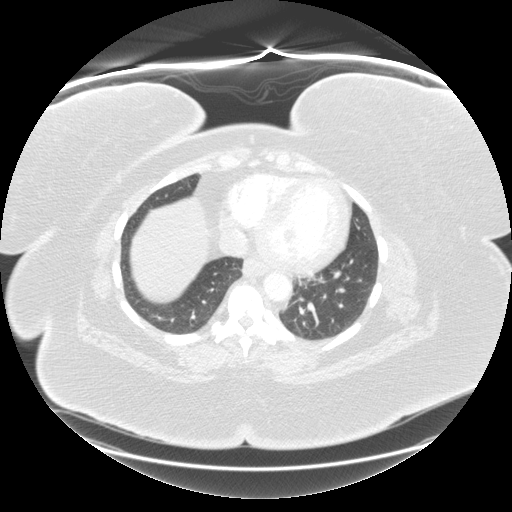
[im 76/214  mediastinal]
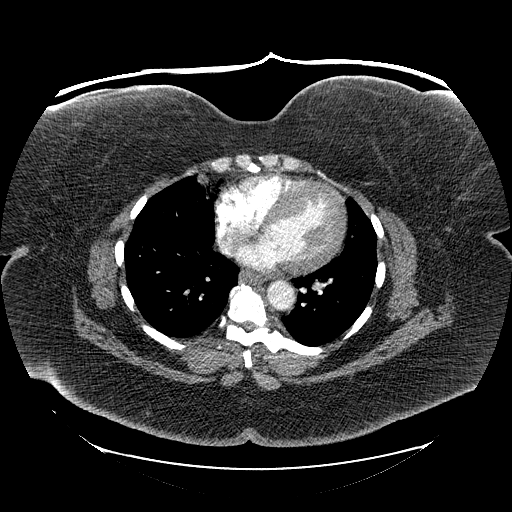
[im 88/214  lung]
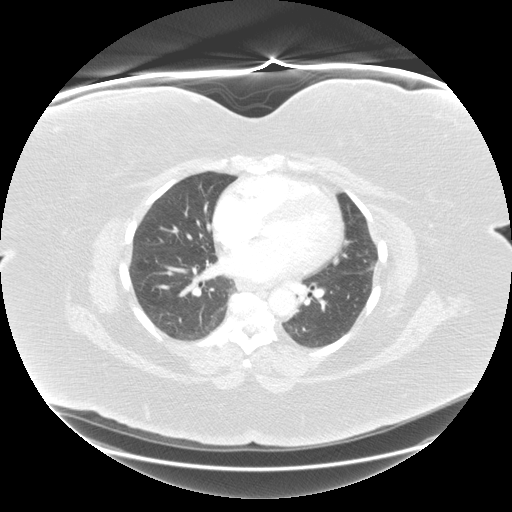
[im 100/214  mediastinal]
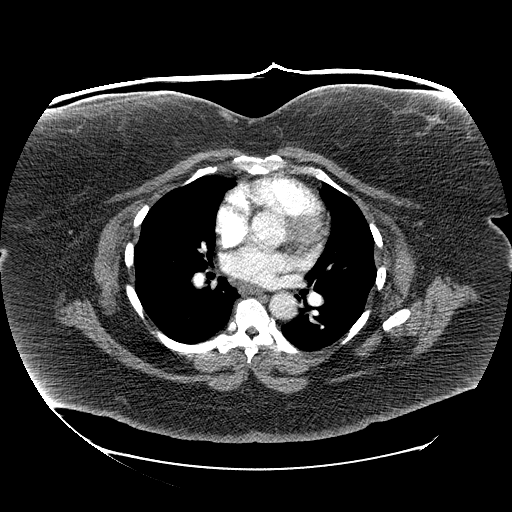
[im 101/214  lung]
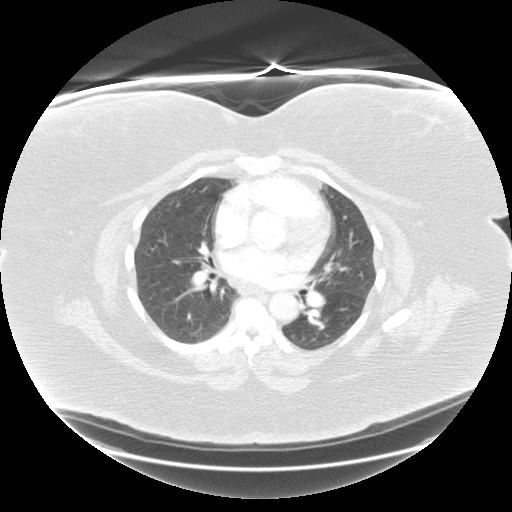
[im 107/214  mediastinal]
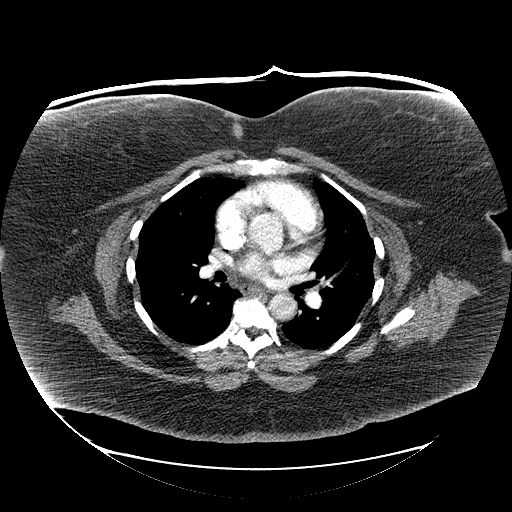
[im 113/214  lung]
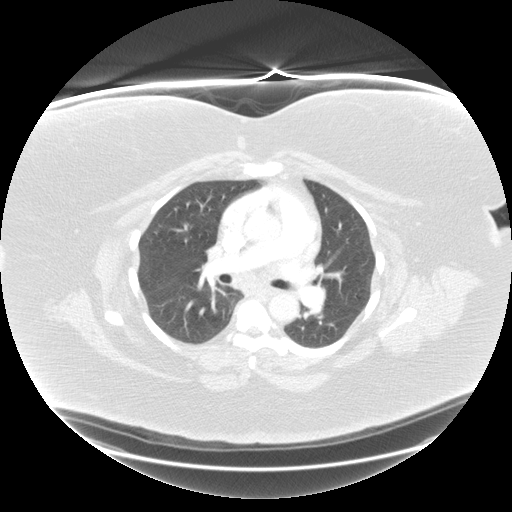
[im 126/214  mediastinal]
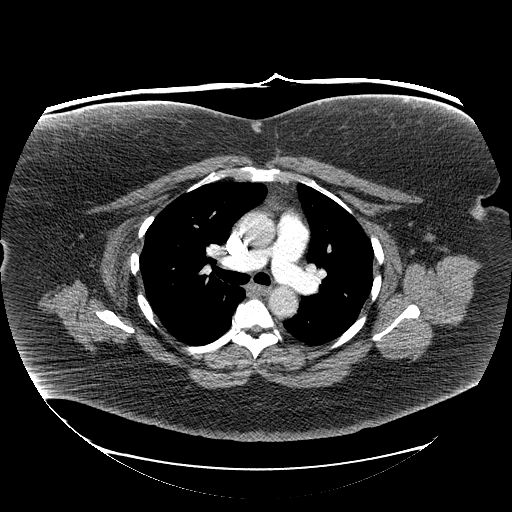
[im 138/214  lung]
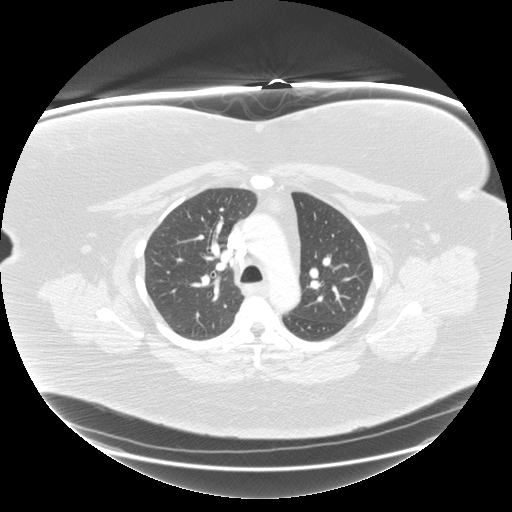
[im 151/214  mediastinal]
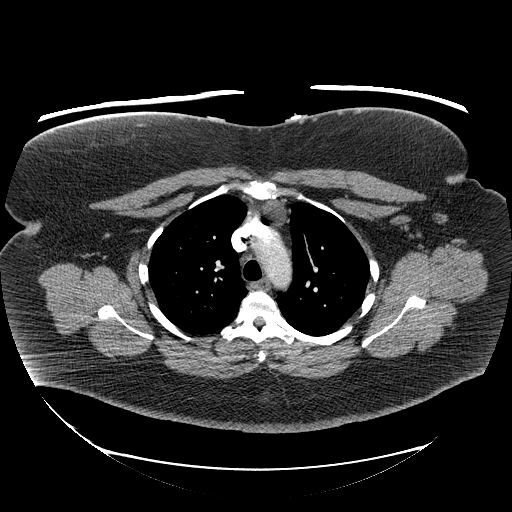
[im 163/214  lung]
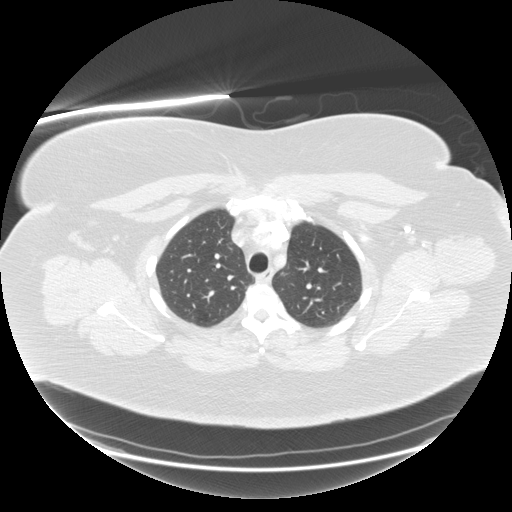
[im 176/214  mediastinal]
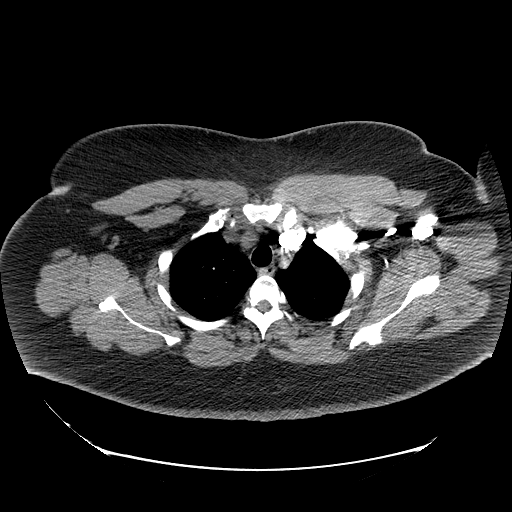
[im 188/214  lung]
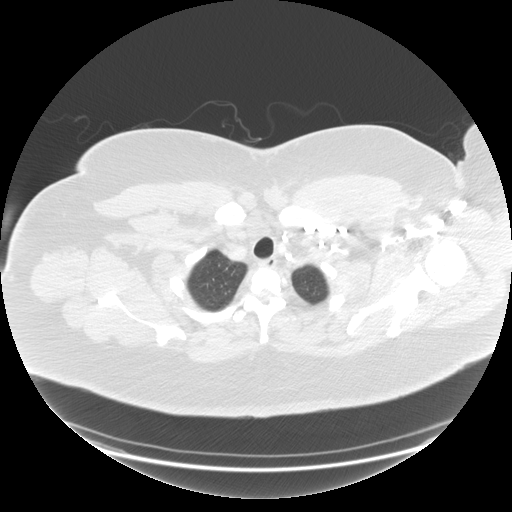
[im 201/214  mediastinal]
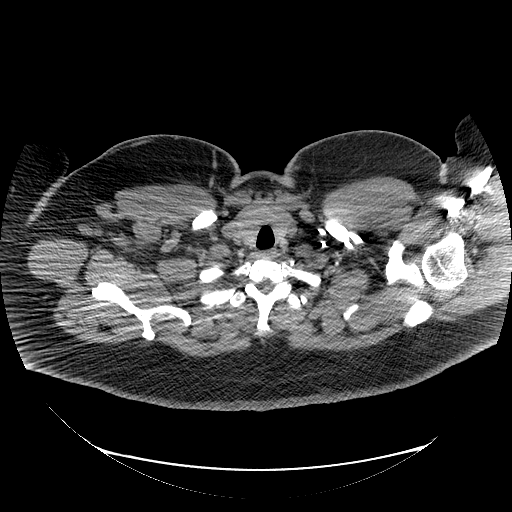

[18 of 31 positions shown; findings below may reference images not displayed]

FINDINGS: The pulmonary arteries are well opacified with contrast.
There is no evidence of acute pulmonary embolism.  The thoracic
aorta appears normal.  There is no pleural or pericardial effusion.

Two retrosternal nodules are again noted.  One on the right
measures 11 x 17 mm on image 28, and one more inferiorly on the
left measures 2.0 x 2.4 cm on image 36.  These both appear stable.
There is probable additional pre tracheal nodularity in the
suprasternal region measuring 12 x 19 mm on image 15, also
unchanged.  There are no enlarged mediastinal lymph nodes.

The lungs are clear aside from chronic right middle lobe
atelectasis, unchanged.  No acute findings are demonstrated in the
upper abdomen.  There is probable hepatic steatosis.

Review of the MIP images confirms the above findings.
IMPRESSION: 1.  No evidence of acute pulmonary embolism or other acute chest
process.
2.  Stable soft tissue nodules in the prevascular space.  These
have been present on multiple prior studies and are compatible with
a benign finding, probably benign thymic lesions.
3.  Stable chronic right middle lobe atelectasis.

## 2010-07-26 IMAGING — CR DG ABDOMEN 2V
2 series · 2 of 2 positions shown · non-contrast
Comparison: 03/15/2007.

CLINICAL DATA: Abdominal pain nausea vomiting and diarrhea.

ABDOMEN - 2 VIEW

[w abdomen upright *]
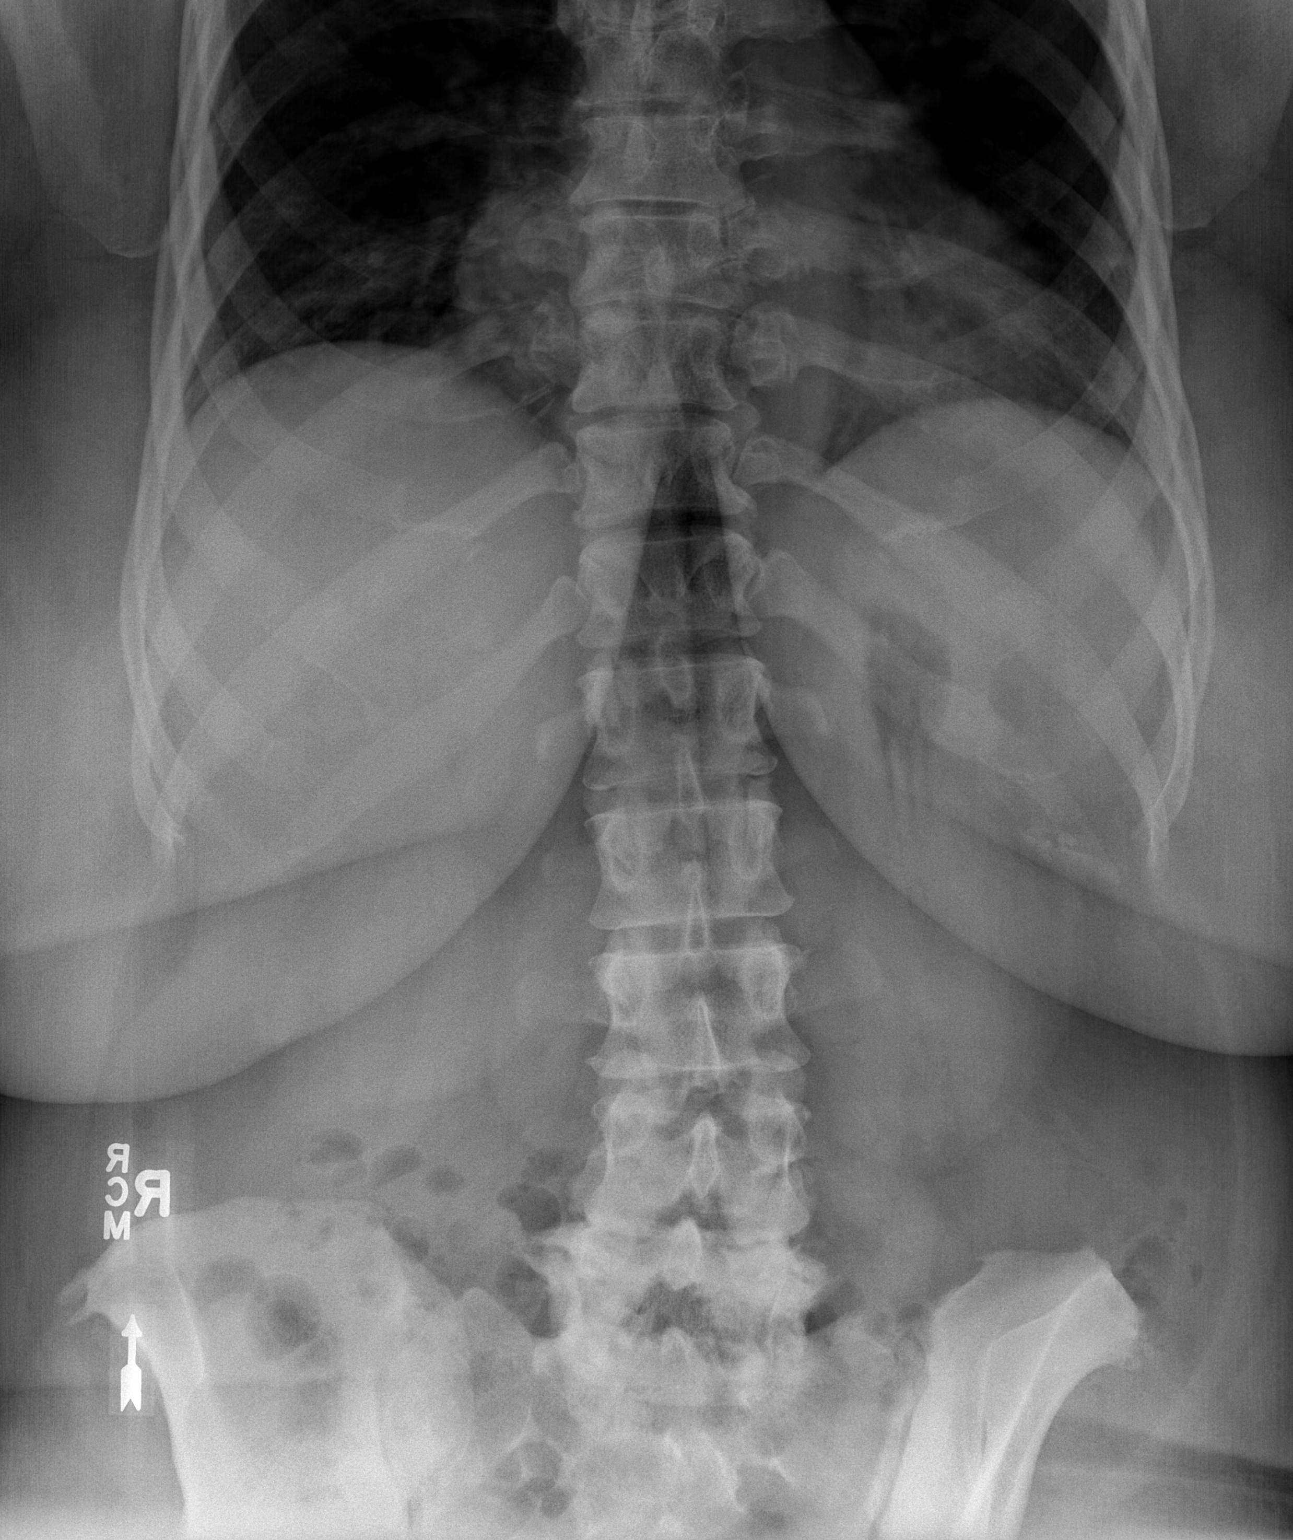

[t abdomen supine]
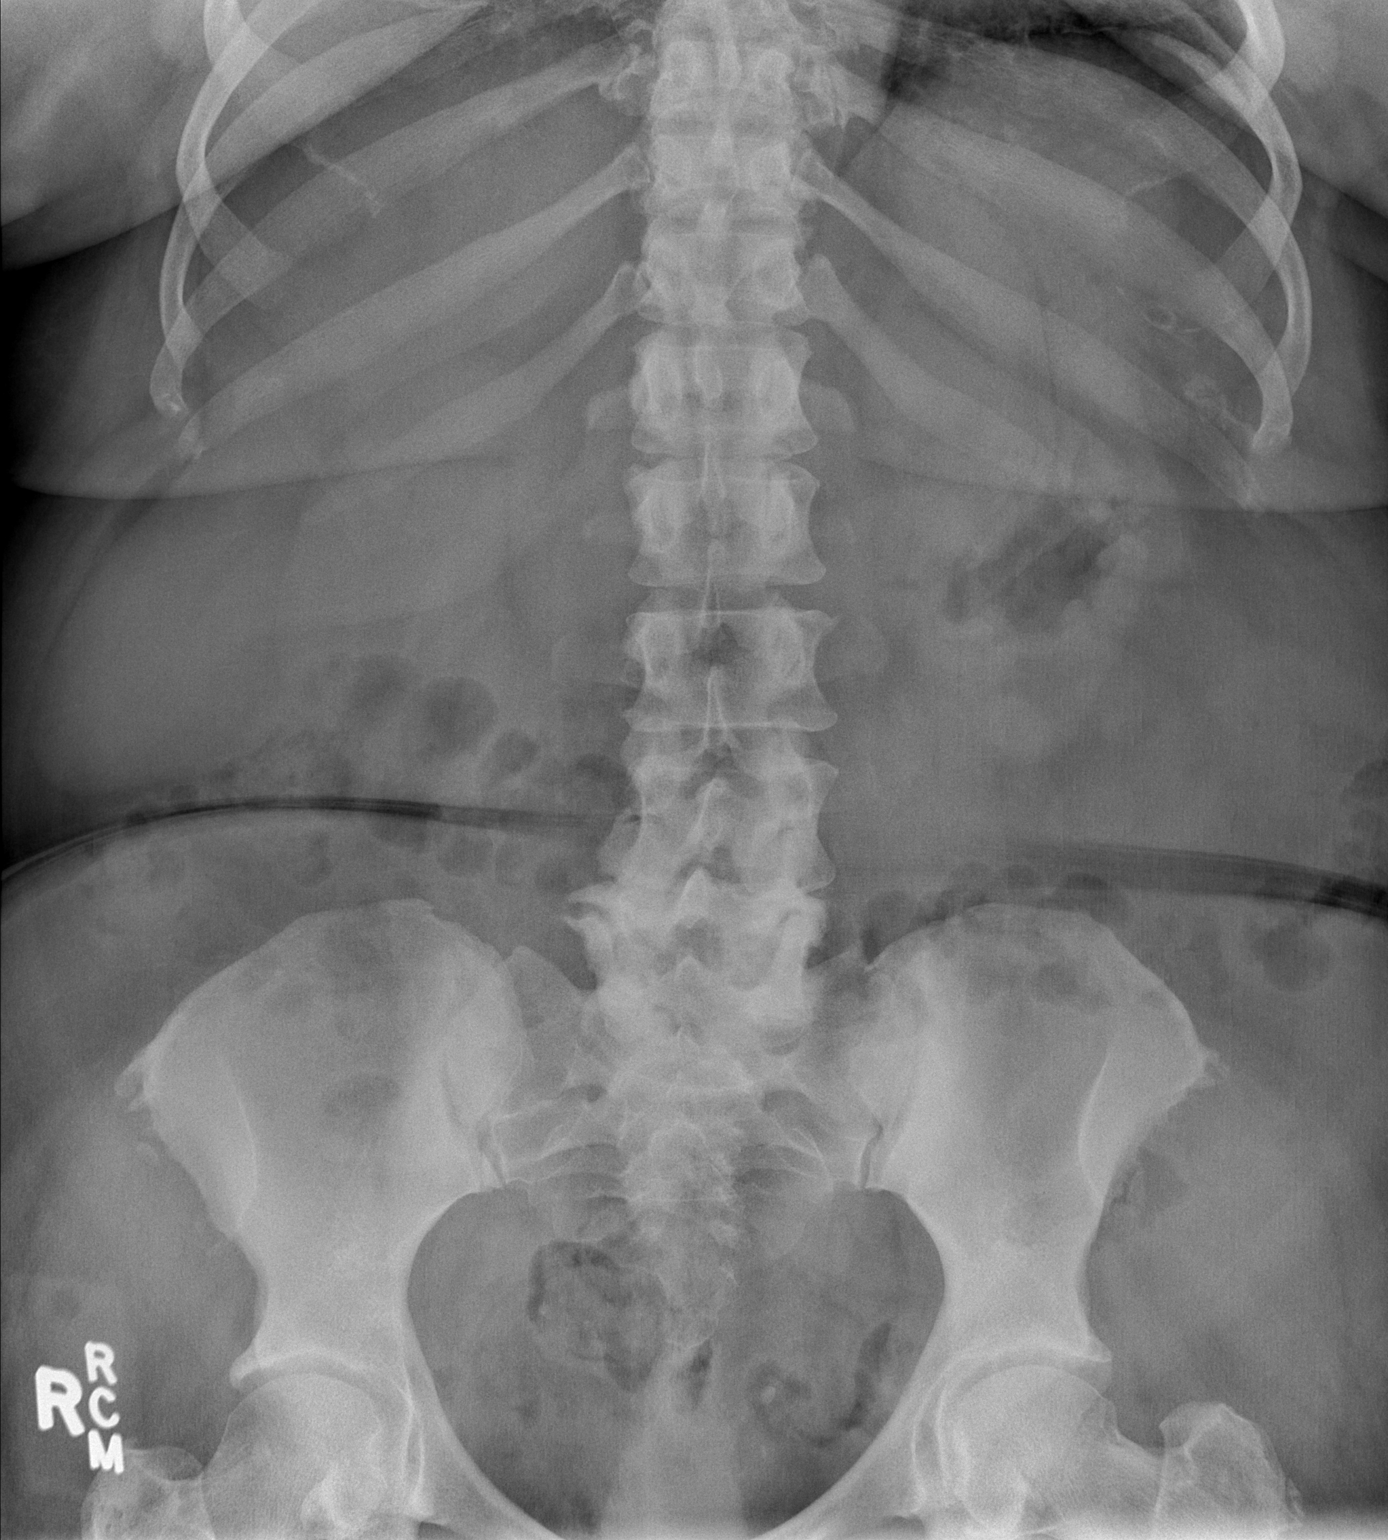

[2 of 2 positions shown; findings below may reference images not displayed]

FINDINGS: Normal bowel gas pattern.  There is no bowel obstruction
or free air.  No renal calculi are identified.  Lumbar disc
degeneration is present at L4-5.
IMPRESSION: No acute abnormality.

## 2010-07-26 IMAGING — CR DG CHEST 2V
2 series · 2 of 2 positions shown · non-contrast
Comparison: 07/16/2008.

CLINICAL DATA: Cough and chest pain.

CHEST - 2 VIEW

[w chest pa]
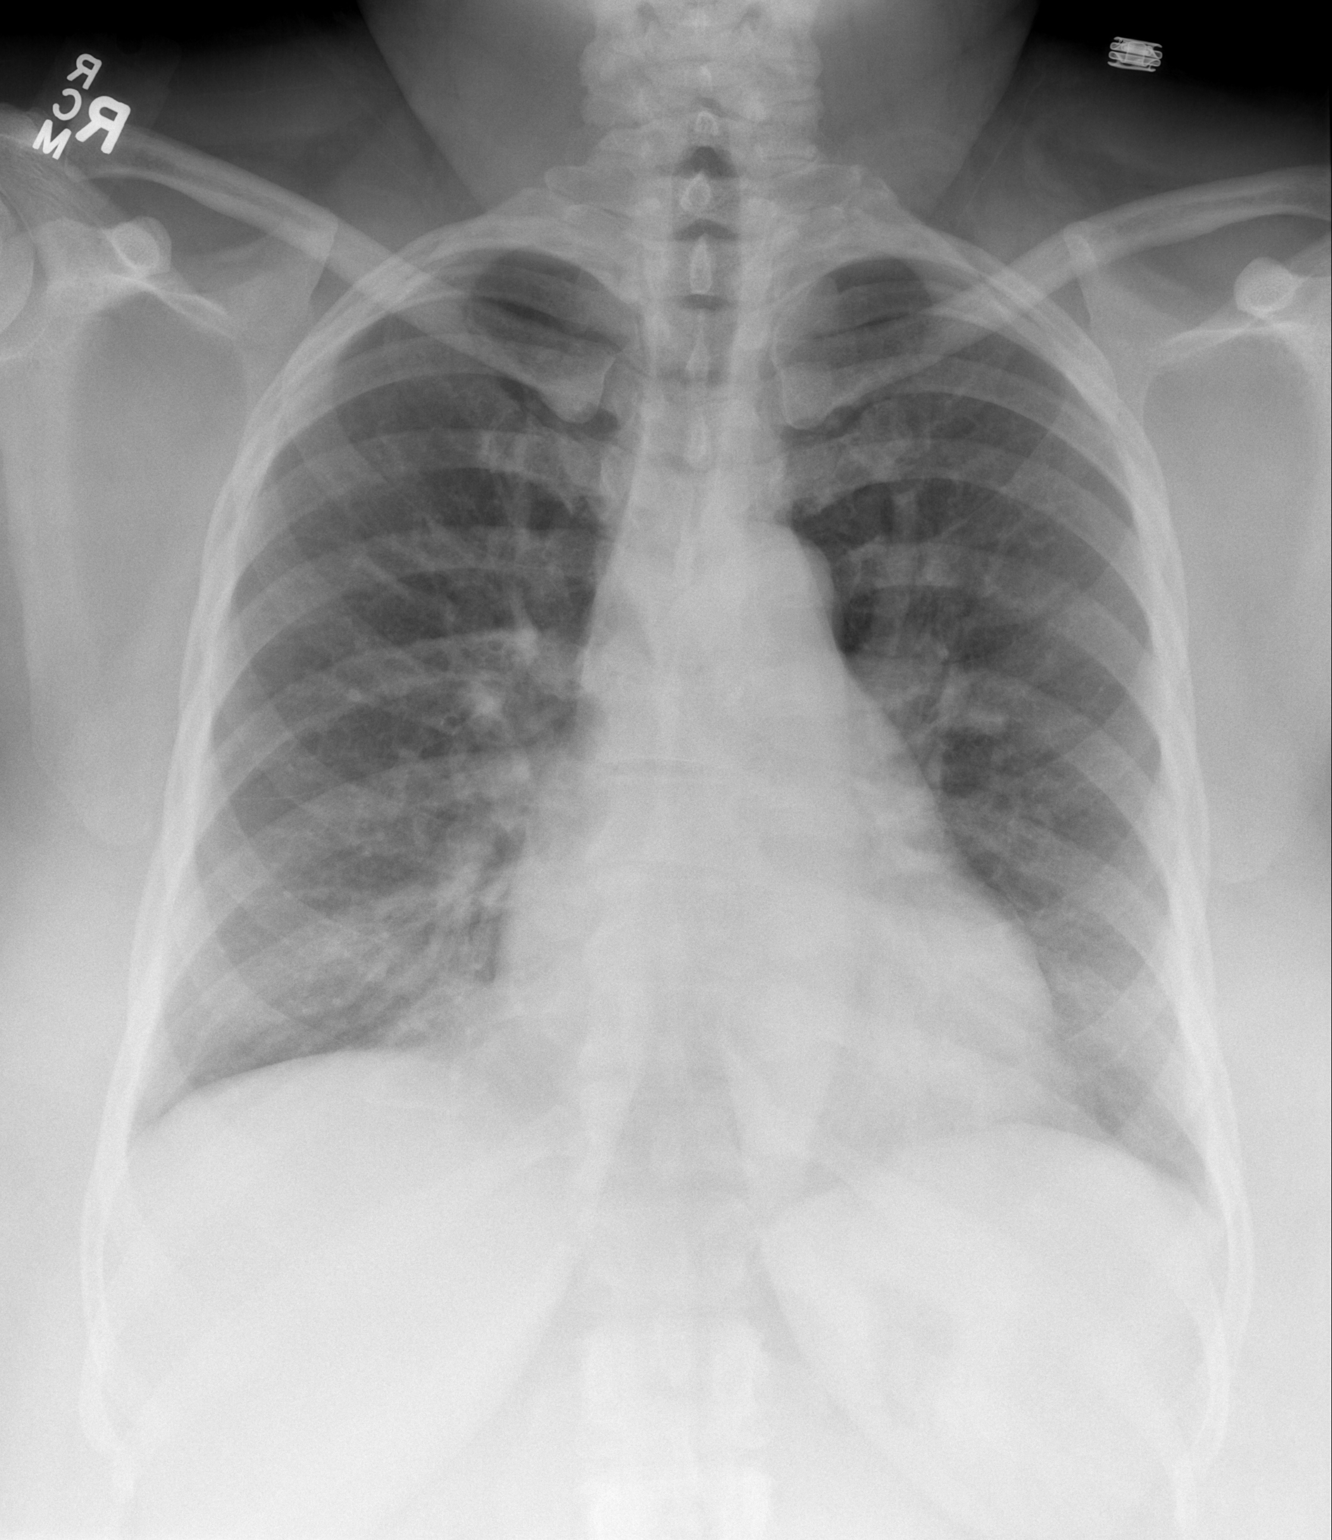

[w chest lat]
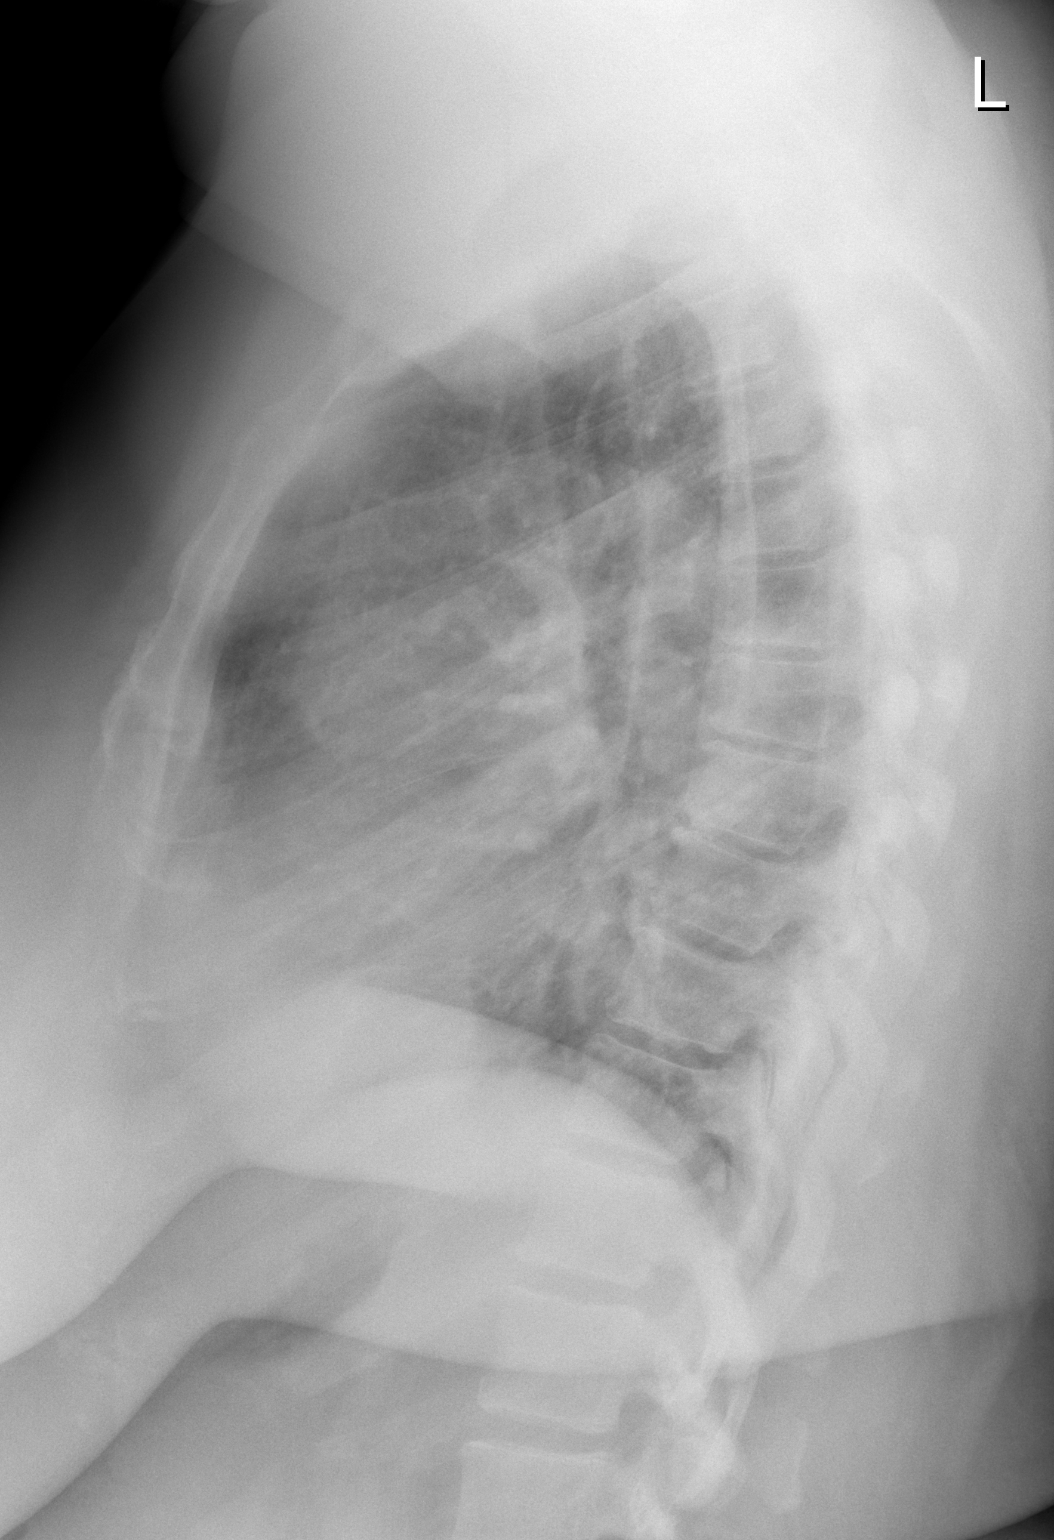

[2 of 2 positions shown; findings below may reference images not displayed]

FINDINGS: Mild patchy airspace density in both lung bases, similar
to prior studies.  This could be due to scarring or atelectasis.
Lung volume is normal and there is no pleural effusion.  Heart size
is upper normal.
IMPRESSION: Mild patchy bibasilar atelectasis or scarring is stable.  No new
findings.

## 2010-07-27 LAB — DIFFERENTIAL
Basophils Absolute: 0 10*3/uL (ref 0.0–0.1)
Basophils Relative: 1 % (ref 0–1)
Eosinophils Absolute: 0.2 10*3/uL (ref 0.0–0.7)
Eosinophils Relative: 3 % (ref 0–5)
Lymphocytes Relative: 33 % (ref 12–46)
Lymphs Abs: 2.1 10*3/uL (ref 0.7–4.0)
Monocytes Absolute: 0.4 10*3/uL (ref 0.1–1.0)
Monocytes Relative: 7 % (ref 3–12)
Neutro Abs: 3.5 10*3/uL (ref 1.7–7.7)
Neutrophils Relative %: 56 % (ref 43–77)

## 2010-07-27 LAB — CBC
HCT: 40.7 % (ref 36.0–46.0)
Hemoglobin: 14.1 g/dL (ref 12.0–15.0)
MCHC: 34.6 g/dL (ref 30.0–36.0)
MCV: 88.5 fL (ref 78.0–100.0)
Platelets: 233 10*3/uL (ref 150–400)
RBC: 4.6 MIL/uL (ref 3.87–5.11)
RDW: 13.4 % (ref 11.5–15.5)
WBC: 6.2 10*3/uL (ref 4.0–10.5)

## 2010-07-27 LAB — BASIC METABOLIC PANEL
BUN: 7 mg/dL (ref 6–23)
CO2: 32 mEq/L (ref 19–32)
Calcium: 9.5 mg/dL (ref 8.4–10.5)
Chloride: 100 mEq/L (ref 96–112)
Creatinine, Ser: 0.73 mg/dL (ref 0.4–1.2)
GFR calc Af Amer: >60 mL/min (ref >60)
GFR calc non Af Amer: >60 mL/min (ref >60)
Glucose, Bld: 142 mg/dL — ABNORMAL HIGH (ref 70–99)
Potassium: 3.6 mEq/L (ref 3.5–5.1)
Sodium: 138 mEq/L (ref 135–145)

## 2010-08-02 LAB — CBC
HCT: 39.7 % (ref 36.0–46.0)
Hemoglobin: 13.7 g/dL (ref 12.0–15.0)
MCHC: 34.5 g/dL (ref 30.0–36.0)
MCV: 87 fL (ref 78.0–100.0)
Platelets: 218 10*3/uL (ref 150–400)
RBC: 4.56 MIL/uL (ref 3.87–5.11)
RDW: 13.4 % (ref 11.5–15.5)
WBC: 7.3 10*3/uL (ref 4.0–10.5)

## 2010-08-02 LAB — COMPREHENSIVE METABOLIC PANEL
ALT: 23 U/L (ref 0–35)
AST: 22 U/L (ref 0–37)
Albumin: 3.7 g/dL (ref 3.5–5.2)
Alkaline Phosphatase: 50 U/L (ref 39–117)
BUN: 9 mg/dL (ref 6–23)
CO2: 27 mEq/L (ref 19–32)
Calcium: 9.1 mg/dL (ref 8.4–10.5)
Chloride: 103 mEq/L (ref 96–112)
Creatinine, Ser: 0.81 mg/dL (ref 0.4–1.2)
GFR calc Af Amer: >60 mL/min (ref >60)
GFR calc non Af Amer: >60 mL/min (ref >60)
Glucose, Bld: 92 mg/dL (ref 70–99)
Potassium: 3.6 mEq/L (ref 3.5–5.1)
Sodium: 138 mEq/L (ref 135–145)
Total Bilirubin: 0.6 mg/dL (ref 0.3–1.2)
Total Protein: 7.2 g/dL (ref 6.0–8.3)

## 2010-08-02 LAB — URINALYSIS, ROUTINE W REFLEX MICROSCOPIC
Bilirubin Urine: NEGATIVE
Glucose, UA: NEGATIVE mg/dL
Hgb urine dipstick: NEGATIVE
Ketones, ur: NEGATIVE mg/dL
Nitrite: NEGATIVE
Protein, ur: NEGATIVE mg/dL
Specific Gravity, Urine: 1.03 (ref 1.005–1.030)
Urobilinogen, UA: 0.2 mg/dL (ref 0.0–1.0)
pH: 6 (ref 5.0–8.0)

## 2010-08-02 LAB — POCT CARDIAC MARKERS
CKMB, poc: 1.5 ng/mL (ref 1.0–8.0)
Myoglobin, poc: 71.1 ng/mL (ref 12–200)
Troponin i, poc: 0.05 ng/mL (ref 0.00–0.09)

## 2010-08-02 LAB — DIFFERENTIAL
Basophils Absolute: 0.1 10*3/uL (ref 0.0–0.1)
Basophils Relative: 1 % (ref 0–1)
Eosinophils Absolute: 0.2 10*3/uL (ref 0.0–0.7)
Eosinophils Relative: 3 % (ref 0–5)
Lymphocytes Relative: 42 % (ref 12–46)
Lymphs Abs: 3 10*3/uL (ref 0.7–4.0)
Monocytes Absolute: 0.4 10*3/uL (ref 0.1–1.0)
Monocytes Relative: 6 % (ref 3–12)
Neutro Abs: 3.5 10*3/uL (ref 1.7–7.7)
Neutrophils Relative %: 49 % (ref 43–77)

## 2010-08-02 LAB — POCT PREGNANCY, URINE: Preg Test, Ur: NEGATIVE

## 2010-08-02 LAB — D-DIMER, QUANTITATIVE (NOT AT ARMC): D-Dimer, Quant: 0.75 ug/mL-FEU — ABNORMAL HIGH (ref 0.00–0.48)

## 2010-08-02 LAB — BRAIN NATRIURETIC PEPTIDE: Pro B Natriuretic peptide (BNP): 89 pg/mL (ref 0.0–100.0)

## 2010-08-02 LAB — LIPASE, BLOOD: Lipase: 28 U/L (ref 11–59)

## 2010-08-04 LAB — POCT CARDIAC MARKERS
CKMB, poc: 1.3 ng/mL (ref 1.0–8.0)
Myoglobin, poc: 79.4 ng/mL (ref 12–200)
Troponin i, poc: <0.05 ng/mL (ref 0.00–0.09)

## 2010-08-04 LAB — DIFFERENTIAL
Basophils Absolute: 0 10*3/uL (ref 0.0–0.1)
Basophils Relative: 0 % (ref 0–1)
Eosinophils Absolute: 0.2 10*3/uL (ref 0.0–0.7)
Eosinophils Relative: 3 % (ref 0–5)
Lymphocytes Relative: 34 % (ref 12–46)
Lymphs Abs: 2.6 10*3/uL (ref 0.7–4.0)
Monocytes Absolute: 0.4 10*3/uL (ref 0.1–1.0)
Monocytes Relative: 5 % (ref 3–12)
Neutro Abs: 4.4 10*3/uL (ref 1.7–7.7)
Neutrophils Relative %: 58 % (ref 43–77)

## 2010-08-04 LAB — CBC
HCT: 38.9 % (ref 36.0–46.0)
Hemoglobin: 13.4 g/dL (ref 12.0–15.0)
MCHC: 34.3 g/dL (ref 30.0–36.0)
MCV: 87.8 fL (ref 78.0–100.0)
Platelets: 226 10*3/uL (ref 150–400)
RBC: 4.43 MIL/uL (ref 3.87–5.11)
RDW: 13.6 % (ref 11.5–15.5)
WBC: 7.6 10*3/uL (ref 4.0–10.5)

## 2010-08-04 LAB — POCT I-STAT, CHEM 8
BUN: 11 mg/dL (ref 6–23)
Calcium, Ion: 1.09 mmol/L — ABNORMAL LOW (ref 1.12–1.32)
Chloride: 103 mEq/L (ref 96–112)
Creatinine, Ser: 1 mg/dL (ref 0.4–1.2)
Glucose, Bld: 172 mg/dL — ABNORMAL HIGH (ref 70–99)
HCT: 41 % (ref 36.0–46.0)
Hemoglobin: 13.9 g/dL (ref 12.0–15.0)
Potassium: 3.3 mEq/L — ABNORMAL LOW (ref 3.5–5.1)
Sodium: 139 mEq/L (ref 135–145)
TCO2: 27 mmol/L (ref 0–100)

## 2010-08-04 LAB — D-DIMER, QUANTITATIVE: D-Dimer, Quant: 0.37 ug/mL-FEU (ref 0.00–0.48)

## 2010-08-18 NOTE — Discharge Summary (Signed)
[name], [name]           ACCOUNT NO.:  [id number]  MEDICAL RECORD NO.:  [id number]           PATIENT TYPE:  I  LOCATION:  2031                         FACILITY:  [facility]  PHYSICIAN:  Jaidence Geisler I Chanya Chrisley, MD      DATE OF BIRTH:  Jan 14, 1961  DATE OF ADMISSION:  05/28/2010 DATE OF DISCHARGE:  05/31/2010                              DISCHARGE SUMMARY   PRIMARY CARE PHYSICIAN:  Maurice March, MD  DISCHARGE DIAGNOSES: 1. Atypical chest pain, felt to be secondary to the facial swelling     after tooth extraction. 2. Status post dental extraction with local inflammation of the     adjacent muscles of the masticator space and nonspecific     inflammation of the subcutaneous fatty tissue of the left face     without any drainable abscess. 3. Hypertension, which seemed uncontrolled. 4. Diabetes mellitus. 5. Obesity.  PROCEDURE: 1. Chest x-ray, bibasilar sub-segmental atelectasis or early     infiltrate. 2. CT maxillofacial, recent extraction of tooth #16.  Fluid and air     within the extraction socket.  Cortical breakthrough laterally.     Inflammation of the adjacent muscle of the masticator space and     nonspecific inflammation of subcutaneous tissue.  No identified     atelectasis. 3. A 2-D echo EF of 55% wall motion within normal.  There is no     regional wall abnormality.  Aortic valve mobility was not     restricted.  HISTORY OF PRESENT ILLNESS:  This is a 50 year old female with history of diabetes mellitus on diet control, obesity, and hypertension.  She was recently diagnosed with tooth abscess, status post 3 teeth extraction on her left side and one on her right side.  Since then, the patient continued to complain of left side facial weakness and swelling associated with neck pain and swelling on her left cheek.  She presented twice to the emergency room and was discharged on clindamycin p.o. in addition to oxycodone and Ultracet.  The patient's symptom  continued to get worse, the swelling on her face progressively worsening and the patient unable to eat regular food and then the patient started to have chest pain, retrosternal, radiating to her back and associated with shortness of breath and progressively worse when taking deep breath. The patient has a recent CT of the chest, which did not show any evidence of pulmonary embolism.  CT angio was done on October 2011.  The patient admitted to the hospital. 1. Status post 4 teeth extraction with associated local inflammation.     The patient started on clindamycin and oral hygiene.  On     presentation, the patient had white blood cells of 11.9.  Case     discussed with dental on call, recommended continue antibiotic     also.  Also discussed with oral surgeon on call and recommended     outpatient followup if the swelling progressively worsened, and CT     scan of the maxillary facial discussed with him.  As long as there     is no abscess, there is no need for any  further intervention.  The     patient's swelling gradually resolved. 2. Chest pain.  Cardiac enzyme was cycled and troponin was mildly     elevated at 0.09.  Cardiology consulted and felt there is no     further cardiac workup needed and he did not recommend further     workup at this point.  As per recommendation, treat facial     inflammation infection.  When she improves, then we will discharge     and she have an outpatient Myoview for risk stratification, given     her multiple risk factors.  A D-dimer was negative and as we     mentioned CT angio on October was negative. 3. Leukocytosis, completely resolved.  Mild LFT elevation, need to be     followed and addressed by her primary care physician as an     outpatient.  The patient is completely asymptomatic. 4. Hypertension, adjustment of her medication done during hospital     stay and further recommendation by her MD.  DISCHARGE MEDICATIONS: 1. Chlorhexidine every  6 hours. 2. Vicodin 1 tablet q.6 hours as needed. 3. Clindamycin 300 mg p.o. q.6 hours for 7 days. 4. Norvasc 10 mg p.o. daily. 5. Lisinopril/hydrochlorothiazide 20/25 mg p.o. daily. 6. Imdur 20 mg daily. 7. K-Dur 10 mEq p.o. twice daily. 8. Lipitor 20 mEq p.o. daily. 9. Mirtazapine 50 mg p.o. daily. 10.ProAir 1 puff q.4 hour as needed. 11.Sertraline 50 mg p.o. daily.  At this time, we felt the patient is stable for discharge.     Taher Vannote Bosie Helper, MD     HIE/MEDQ  D:  08/16/2010  T:  08/17/2010  Job:  [id number]  cc:   Maurice March, M.D.  Electronically Signed by Ebony Cargo MD on 08/18/2010 09:03:12 AM

## 2010-09-06 NOTE — H&P (Signed)
[name], [name]                 ACCOUNT NO.:  [id number]   MEDICAL RECORD NO.:  [id number]          PATIENT TYPE:  INP   LOCATION:  3733                         FACILITY:  [facility]   PHYSICIAN:  Della Goo, M.D. DATE OF BIRTH:  [birth year]   DATE OF ADMISSION:  10/14/2006  DATE OF DISCHARGE:                              HISTORY & PHYSICAL   PRIMARY CARE PHYSICIAN:  [facility], Dr. Audria Nine.   CHIEF COMPLAINT:  Chest pain.   HISTORY OF PRESENT ILLNESS:  This is a 50 year old female presenting to  the emergency department secondary to complaints of substernal chest  pain radiating into the back.  She reports having pain for approximately  1 week off and on; however, chest pain began again today and was  radiating into the right arm.  She denies having any shortness of  breath, does report having nausea and diarrhea associated with her  symptoms.  She rated the pain as being an 8/10.  She denies having any  fevers, chills.   PAST MEDICAL HISTORY:  1. Hypertension.  2. Previous pulmonary embolism.  3. Depression.  4. Arthritis.  5. Hyperlipidemia.   MEDICATIONS:  1. Diovan HCTZ 160/12.5 one p.o. daily.  2. Relafen 750 mg one p.o. b.i.d.  3. Flexeril 10 mg one p.o. t.i.d. p.r.n.  4. Paxil 20 mg one p.o. daily.  5. Lipitor 20 mg one p.o. daily.  (The patient reports that her Coumadin therapy was discontinued 3 days  ago.)   ALLERGIES:  1. PENICILLIN.  2. SULFA.   PAST SURGICAL HISTORY:  1. Total abdominal hysterectomy.  2. Left knee arthroscopy.  3. Tonsillectomy.  4. Exploratory laparotomy.   SOCIAL HISTORY:  The patient is currently a nonsmoker, nondrinker.  She  has a remote history of smoking.  No history of drug usage.   FAMILY HISTORY:  Father had coronary artery disease and a CVA.   REVIEW OF SYSTEMS:  Pertinents are mentioned above.   PHYSICAL EXAMINATION FINDINGS:  VITAL SIGNS:  Obese 50 year old female  in no acute distress currently.  VITAL  SIGNS:  Temperature 98.1, blood pressure 146/94, heart rate 86,  respirations 16, O2 saturations 94-97%.  HEENT:  Normocephalic, atraumatic.  Pupils equally round, reactive to  light.  Extraocular muscles are intact.  Funduscopic benign.  Oropharynx  is clear.  NECK:  Supple, full range of motion.  No thyromegaly, adenopathy or  jugular venous distension.  CARDIOVASCULAR:  Regular rate and rhythm.  No murmurs, gallops or rubs.  LUNGS:  Clear to auscultation bilaterally.  ABDOMEN:  Positive bowel sounds, soft, nontender, nondistended.  No  hepatosplenomegaly.  EXTREMITIES:  Without cyanosis, clubbing or edema.  NEUROLOGIC:  Alert and oriented x3.  There are no focal deficits.   LABORATORY STUDIES:  White blood cell count 7.1, hemoglobin 13.5,  hematocrit 40.0, platelets 251, neutrophils 50%, lymphocytes 39%.  Pro  time 12.7, INR 0.9.  Sodium 134, potassium 3.5, chloride 99, CO2 of 26,  BUN 9, creatinine 0.74, glucose 87.  Cardiac enzymes - total CK 140, CK-  MB 1.9, relative index 1.4, troponin 0.10.  Of note,  the patient has had  multiple CT angiograms of the chest, the last one being August 20, 2006,  results of which were negative.  However, in January, the CT angiogram  revealed a questionable pulmonary embolus versus artifact.  Also, the  patient had a previous nuclear medicine myocardial imaging study  revealing left ventricular ejection fraction of 64%, and this was  performed in January of 2008.   ASSESSMENT:  34. A 50 year old female being admitted with chest pain.  2. Acute gastroenteritis.  3. Hypertension.  4. Hyperlipidemia.  5. Obesity.  6. Arthritis.   PLAN:  The patient will be admitted to a telemetry area for cardiac  monitoring.  She will be placed on nitrites, supplemental oxygen and  aspirin therapy.  She will continue on her regular medications which  need to be verified.  DVT and GI prophylaxis have been ordered.      Della Goo, M.D.   Electronically Signed     HJ/MEDQ  D:  10/14/2006  T:  10/15/2006  Job:  [id number]

## 2010-09-06 NOTE — Consult Note (Signed)
NAMEJOLYN, [name] NO.:  [id number]   MEDICAL RECORD NO.:  [id number]          PATIENT TYPE:  INP   LOCATION:  2921                         FACILITY:  [facility]   PHYSICIAN:  Peter M. Swaziland, M.D.  DATE OF BIRTH:  [birth year]   DATE OF CONSULTATION:  04/05/2007  DATE OF DISCHARGE:                                 CONSULTATION   REASON FOR CONSULTATION:  Chest pain   HISTORY OF PRESENT ILLNESS:  The patient is a 50 year old African-  American female with a history of hypertension, hyperlipidemia, morbid  obesity, COPD, and asthma, and repeat hospital visit for chest pain with  a past treatment for possible PE seen on CT angiogram in January 2008.  The patient is here again with chest pain and shortness of breath.  Yesterday in the morning, she developed chest pain, shortness of breath  when walking to the bus stop.  This improved with rest, then later on in  the evening while sleeping in her chair she has awakened with a painful  tingling in her chest substernally.  Pain radiated around to the center  of her chest and through to her back.  She reports diaphoresis and  chills around this time.  Denies any nausea or vomiting.  The patient  typically has chest pain every few days.  It comes on with exertion but  pain seemed more intense yesterday in the evening which is why she  wanted to go into the hospital.  The pain she is feeling is very similar  to when she was diagnosed with the PE back in January but current CT  angiogram of her chest done yesterday was negative for PE.  The patient  has had 7 chest CTs since beginning of the year for chest pain,  shortness of breath.  A nuclear stress test in January 2008 showed no  ischemia.  She currently denies any trouble with reflux or pain  occurring after eating or any relationship with her chest pain to  eating.   PAST MEDICAL HISTORY:  1. Hypertension.  2. Obesity.  3. Repeated episodes of chest pain requiring  multiple evaluations.  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  5. Question of obstructive sleep apnea.  6. Treatment with Coumadin for PE found in January of 2008, treatment      lasted from January to April.  7. Arthritis.  8. Hyperlipidemia.  9. COPD/asthma.  10.Depression/anxiety.   PAST SURGICAL HISTORY:  Past surgical history includes hysterectomy,  appendectomy, tonsillectomy, bilateral tubal ligation.   ALLERGIES:  Include PENICILLIN, SULFA and OXYCONTIN.   HOSPITAL MEDICATIONS:  Include:  1. Albuterol and Atrovent neb treatments.  2. Aspirin 81 mg  3. Heparin drip.  4. Nitroglycerin drip.  5. HCTZ 12.5 mg.  6. Avapro 300 mg daily.  7. Protonix.  8. Paxil.  9. Zocor.   SOCIAL HISTORY:  The patient has a history of smoking half a pack per  day for 14 years.  This will give her a 7-pack-year history.  She  stopped  approximately 20 years ago.  Denies alcohol.  Denies drug use  currently.   FAMILY HISTORY:  Father died of an MI along with a CVA at age 47.  Mom  has hypertension.  There is also some cancer in the family.   REVIEW OF SYSTEMS:  Please see HPI.   PHYSICAL EXAMINATION:  VITALS:  Temperature 97.9, heart rate 70s to 80s,  blood pressure 120s-140s/60s-70s, respiratory rate 70 to 20, patient  sating 99% on room air.  GENERAL:  The patient is alert, in no acute distress.  She is hoarse  sounding.  CARDIOVASCULAR:  No JVD noted.  Heart regular rate and rhythm.  No  murmurs, rubs, or gallops.  S1, S2 present.  Pedal pulses are 2+, radial  pulses are +1 bilaterally.  PULMONARY:  Lungs are clear to auscultation bilaterally.  No wheezes or  rales or rhonchi.  Breath sounds were diminished bilaterally likely due  to size.  ABDOMEN:  Soft, nondistended, mildly tender in epigastric region.  EXTREMITIES:  Trace edema bilaterally in her lower extremities/   LABORATORY DATA:  Labs include cardiac enzymes that were cycled.  CK,  CK-  MB, and relative index were all normal.  However, troponin is still  evaluated at to 0.09 and are currently 0.08.  INR normal 1.1.  BNP  normal at 331.  CBC:  White blood cell count 7.3, hemoglobin 12.2,  platelets 228,000.  Sodium 141, potassium 2.5, chloride 105, bicarb 28,  BUN 7, creatinine 0.75, glucose 104.  Lipid panel, total cholesterol  105, triglycerides 69, HDL 36, LDL 55.  Studies include CT angiogram  showing no PE and an unchanged mediastinal mass.  Chest x-ray, no acute  findings.  Echo showed left ventricular ejection fraction of 70%, left  ventricular wall thickness mildly increased, mild mitral regurgitation.  EKG shows Q-wave in aVL, otherwise no acute changes.   ASSESSMENT AND PLAN:  The patient is a 50 year old African-American  female with hypertension, hyperlipidemia, COPD, and asthma with chest  pain concerning due to duration and multiple previous workups chest  pain.  1. Chest pain.  Due to the patient's history of hypertension,      hyperlipidemia, her smoking history, her family history for MI as      well as her slight elevation in troponins and her EKG change, we      recommend catheterization to completely rule out a cardiac etiology      for the chest pain.  Agree with a nitroglycerin drip and heparin      drip.  We will increase aspirin to 325 mg, plan for catheterization      on Monday.  This was explained to the patient today by Dr. [place].  2. Hypertension, well controlled.  Continue Avapro and HCTZ.  3. Hyperlipidemia.  Continue Zocor.  Cholesterol, and fasting lipid      panel excellent except for HDL.  We will      encourage exercise and diet.  4. Asthma/COPD.  This might also be an etiology of her chest pain.  We      will continue albuterol and Atrovent treatment.      Alanda Amass, M.D.  Electronically Signed     ______________________________  Peter M. [place], M.D.    JH/MEDQ  D:  04/05/2007  T:  04/07/2007  Job:  [id number]

## 2010-09-06 NOTE — H&P (Signed)
Barrera, [name]                 ACCOUNT NO.:  [id number]   MEDICAL RECORD NO.:  [id number]          PATIENT TYPE:  INP   LOCATION:  4741                         FACILITY:  [facility]   PHYSICIAN:  Eduard Clos, MDDATE OF BIRTH:  June 17, 1960   DATE OF ADMISSION:  12/18/2007  DATE OF DISCHARGE:                              HISTORY & PHYSICAL   CHIEF COMPLAINT:  Chest pain.   HISTORY OF PRESENT ILLNESS:  A 50 year old female with a history of  obstructive sleep apnea on CPAP, hypertension, hyperlipidemia, history  of thymic cyst, who presented to the ER complaining of chest pain.  The  patient has been having chest pain for the last 2 days, which is  retrosternal, radiating to her back and shoulders.  The patient stated  that it started off as shoulder pain, which, over the last 2 days, is  more retrosternal.  The patient's chest pain is pressure like, present  at rest, not related to exertion.  Denies any associated cough,  palpitations, dizziness, any nausea, diaphoresis, abdominal pain, fever,  chills, headache or any dysuria or diarrhea.  In the emergency room, the  patient initially was found to have a blood pressure of 207/107, which  presently is more controlled.   PAST MEDICAL HISTORY:  1. Hypertension.  2. Hyperlipidemia.  3. OSA.   PAST SURGICAL HISTORY:  Tonsillectomy.   MEDICATIONS PRIOR TO ADMISSION:  1. Cyclobenzaprine 10 mg p.o. daily as needed.  2. Diovan HCT 160/12.5 daily.  3. Paxil 20 mg p.o. daily.  4. Alphagan.  5. Lipitor 20 mg daily.  6. Aspirin 81 mg daily.  7. Hydroxyzine 10 mg p.o. daily as needed.  8. Imdur .   ALLERGIES:  1. HYDROCODONE.  2. PENICILLIN.  3. SULFA.   FAMILY HISTORY:  Noncontributory.   SOCIAL HISTORY:  The patient denies smoking cigarettes, drinking alcohol  or using illegal drugs.   REVIEW OF SYSTEMS:  As in the history of present illness; otherwise  insignificant.   PHYSICAL EXAMINATION:  GENERAL:  Not in acute  distress.  VITAL SIGNS:  Blood pressure is 149/70, pulse 84 per minute, temperature  98.1, respirations 18 per minute, O2 saturation 99%.  HEENT:  Anicteric, no pallor.  CHEST:  Bilateral air entry present.  No rhonchi or crepitation.  HEART:  Heart sounds 1 and 2 heard.  ABDOMEN:  Soft, nontender.  Bowel sounds heard.  No guarding or rigidity  seen.  CNS: alert awake and oirented x 3.   LABORATORY DATA:  CBC - WBC of 7.6, hemoglobin 12.8, hematocrit 38,  platelets 211, neutrophils 44%.  Basic metabolic panel - sodium of 141,  potassium 3, chloride 105, carbon dioxide 89, glucose 158, BUN 7,  creatinine 0.86, calcium 9.  CK-MB of 2.1, troponin I less than 0.05.   ELECTROCARDIOGRAM:  Normal sinus rhythm with no acute ST-T changes.   CHEST X-RAY:  cardiomegaly and mild changes of congestive heart failure.   ASSESSMENT:  1. Chest pain, rule out ACS.  2. Uncontrolled hypertension.  3. Hyperlipidemia.  4. OSA.   PLAN:  Will place the  patient on telemetry.  Will check cardiac markers.  Will get a CT angiogram of the chest to rule out any dissection.  Resume  home medication and place the patient on a cardiac healthy diet.  Further recommendations as the patient's condition allows.      Eduard Clos, MD  Electronically Signed     ANK/MEDQ  D:  12/18/2007  T:  12/18/2007  Job:  [id number]

## 2010-09-06 NOTE — Letter (Signed)
November 13, 2007   Maurice March, MD  [address] [place], [place] [address]   Re:  JO, BOOZE                 DOB:  January 25, 1961   Dear Dr. Audria Nine:   I saw the patient back for followup today of her mediastinal mass and  the CT scan showed no change.  We have been following this for almost 5  years with no change.  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  I will be happy to see her again, if she develops any  changes in her mediastinal mass or other pulmonary problems.  Her blood  pressure is 165/100, pulse 80, respirations 18, and sats were 95%.  Lungs were clear to auscultation and percussion.   Ines Bloomer, M.D.  Electronically Signed   DPB/MEDQ  D:  11/13/2007  T:  11/14/2007  Job:  [id number]

## 2010-09-06 NOTE — Discharge Summary (Signed)
[name], [name]                 ACCOUNT NO.:  [id number]   MEDICAL RECORD NO.:  [id number]          PATIENT TYPE:  OBV   LOCATION:  4741                         FACILITY:  [facility]   PHYSICIAN:  Renee Ramus, MD       DATE OF BIRTH:  29-Jan-1961   DATE OF ADMISSION:  12/17/2007  DATE OF DISCHARGE:  12/18/2007                               DISCHARGE SUMMARY   PRIMARY DIAGNOSIS:  Chest pain.   SECONDARY DIAGNOSES:  1. Hypertension.  2. Hyperlipidemia.  3. Depression.  4. Chronic back pain.  5. Morbid obesity.  6. Questionable obstructive sleep apnea.   HOSPITAL COURSE BY PROBLEM:  1. Chest pain.  The patient is a 50 year old female admitted secondary      to prolonged chest pain.  The patient has had these symptoms      previously.  She did have a cardiac cath in 2008 showing no      evidence of arteriosclerosis.  The patient currently has had      negative enzymes.  She has negative EKG for acute ischemia or      infarction and her chest pain is now resolved.  The patient thought      not to be suffering from a cardiac cause for her chest pain instead      the more likely diagnosis would be either costochondritis versus      gastroesophageal reflux disease.  The patient is now being      discharged with instructions to follow up with her primary care      physician within 1 week to further monitor her symptoms.  The      patient also underwent a nuclear perfusion imaging which showed      only mild impaired left ventricular systolic function more than      likely secondary to previous infarct.  The patient is well aware      that heart disease cannot be completely excluded but understands      the low likelihood of it being the etiology of her pain given her      extensive testing with negative results.  2. Hypertension.  It has been relatively well controlled while in      house and she will be discharged on a prehospital medications.  3. Hyperlipidemia.  Again, she will be  continued on statin therapy.  4. Depression, this is stable.  She will be continued on SSRI.  5. Chronic back pain.  She will be continued on her anti-      inflammatories, her muscle relaxers, as well as an addition of      Vicodin.  6. Obesity.  The patient has been counseled with respect to weight      loss.  7. Obstructive sleep apnea.  This is stable.   LABORATORIES OF NOTE:  1. Mild increase in blood glucose at 122.  2. Mild increase in creatinine kinase at 178 with negative troponin.  3. Negative drug screen.   STUDIES:  1. CT angiogram of the chest showing no evidence of pulmonary emboli  but evidence of cardiomegaly.  2. Plain view of the chest showing mild cardiomegaly and mild evidence      of congestive heart failure.   MEDICATIONS ON DISCHARGE:  1. Cyclobenzaprine 10 mg 1 p.o. daily p.r.n. back pain.  2. Diovan/hydrochlorothiazide 160/12.5 one p.o. daily.  3. Paxil 20 mg p.o. daily.  4. Alphagan eye drops as needed.  5. Lipitor 20 mg p.o. daily.  6. Aspirin 81 mg p.o. daily.  7. Hydroxyzine 10 mg p.o. daily p.r.n.  8. Imdur 30 mg p.o. daily.  9. Nabumetone 500 mg p.o. b.i.d. p.r.n. pain.  10.Vicodin 5/325 one to two p.o. q.4 h. p.r.n. pain.   There are no labs or studies pending at the time of discharge.  The  patient is in stable condition and anxious for discharge.   TIME SPENT:  35 minutes.      Renee Ramus, MD  Electronically Signed     JF/MEDQ  D:  12/18/2007  T:  12/19/2007  Job:  [id number]   cc:   Dala Dock

## 2010-09-06 NOTE — Discharge Summary (Signed)
[name], [name]                 ACCOUNT NO.:  [id number]   MEDICAL RECORD NO.:  [id number]          PATIENT TYPE:  INP   LOCATION:  2041                         FACILITY:  [facility]   PHYSICIAN:  Lucita Ferrara, MD         DATE OF BIRTH:  1960/07/28   DATE OF ADMISSION:  04/04/2007  DATE OF DISCHARGE:  04/09/2007                               DISCHARGE SUMMARY   PRIMARY CARE PHYSICIAN:  Maurice March, M.D. at [facility].   CARDIOLOGIST:  [facility], Dr. Edwyna Shell.   ADDITIONAL DIAGNOSES:  1. Recurrent chest pain suspicious of unstable angina.  2. Hypertension.  3. Hyperlipidemia.  4. Morbid obesity.  5. Hypokalemia.  6. Obstructive sleep apnea on CPAP.   DISCHARGE DIAGNOSES:  1. Chest pain, investigated via catheterization found to be negative      for ischemia.  2. Hypertension, chronic, controlled.  3. Dyslipidemia, chronic, controlled.  4. Morbid obesity.  5. Hypokalemia, resolved.  6. Obstructive sleep apnea, ongoing.   PROCEDURES:  April 04, 2007, the patient had a CT angiography which  was negative for pulmonary embolism.  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  Chest x-ray showed no acute disease on April 04, 2007.  The patient had a catheterization done on April 08, 2007,  which showed left dominant circulation, LAD within normal limits,  intermediate normal.  Left circumflex normal.  RCA okay.  LV function  with a normal ejection fraction 68%, no significant coronary artery  disease.  Normal left ventricular function was the impression.  Based on  Cardiology recommendations, there was no active ischemic disease.  Echocardiogram done April 05, 2007, showed overall left ventricular  systolic function was vigorous.  Left ventricular ejection fraction to  be about 70%.   BRIEF HISTORY OF PRESENT ILLNESS:  [name] is a 50 year old African  American obese female presented to [facility] on April 04, 2007, with complaints of  exertional shortness of breath and chest  pain underneath the left breast on exertion, and she also complained of  orthopnea and leg swelling.  Apparently, this was an ongoing problem  with her presenting to the emergency room and multiple recurrent  episodes.  On May 06, 2006, she had a stress test which showed mild  hypokinesis involving the anterior wall and mildly impaired left  ventricular function.  Given her multiple risk factors and a semi-  positive stress test, we decided to admit her for possible  catheterization and investigation for coronary artery disease.  Cardiac  enzymes were done cycling.  We ordered a repeat echocardiogram.   HOSPITAL COURSE:  #1 - PLEURITIC CHEST PAIN AND ANGINA.  The patient  underwent catheterization as above.  The patient's catheterization was  within normal limits.  The patient, initially, was started on IV  nitroglycerin and heparin drip.  Echocardiogram was ordered.  Risk  factor modification with Zocor and hypertension, controlled with her  preadmission medications were continued.  AS above, her catheterization  was within normal limits.   #2- DURING THE HOSPITAL COURSE, THE PATIENT DEVELOPED NUMBNESS  IN THE  RIGHT LATERAL THIGH THAT WAS FOCAL.  She did not develop any focal  neurological deficits, numbness, tingling or speech deficiency.  This  was thought to be secondary to pressure in the cutaneous nerve.  The  patient would like to follow up as outpatient in regards to this issue.   #3 - THE PATIENT ALSO DEVELOPED SOME BLOOD TINGED SPUTUM WHICH RESOLVED  AFTER STOPPING THE HEPARIN DRIP.  Note that given the patient had a  history of prior pulmonary emboli, a CT angio was ordered and was  negative for pulmonary embolism.   #4 - HYPERTENSION.  Well controlled.  The patient was continued on her  medications including Avapro 300 mg daily, hydrochlorothiazide 12.5 mg  p.o. daily.   #5 - HISTORY OF CHRONIC OBSTRUCTIVE PULMONARY DISEASE.   Stable during  this admission.  There was minimal wheezing on lung examination.  The  patient was continued on Albuterol/Atrovent nebulizer treatments.   DISCHARGE MEDICATIONS:  The patient is to resume preadmission  medications including:  1. Cyclobenzaprine 10 mg p.o. daily.  2. Diovan/Hydrochlorothiazide 12.5/160 one tablet p.o. daily.  3. Nabumetone 500 mg b.i.d. per preadmission dose as long as kidney      function is evaluated periodically by primary care physician.  4. Paxil 20 mg daily.  5. Lipitor 20 mg daily.  6. Allegra 180 mg p.o. daily.  7. Aspirin 81 mg p.o. daily.  8. Hydroxyzine 10 mg p.r.n.  9. Imdur 20 mg one and one half tablet p.o. b.i.d.  10.Tramadol 550 mg as needed .  11.Alphagan ophthalmic as needed.   CONDITION ON DISCHARGE:  On day of discharge, the patient is  hemodynamically stable.  Chest pain has resolved and thought to be chest  wall pain versus costochondritis.  The patient advised she can take non-  steroidal anti-inflammatory medication in lower doses combined with  Tylenol as long as kidney function was monitored, and also she was given  instructions on risk factors for gastric ulcerations if she uses a  higher dose.  The patient is hemodynamically stable upon discharge.  She  is able to ambulate without difficulty or shortness of breath, nausea,  falls.  She is able to tolerate a regular diet without any problems.      Lucita Ferrara, MD  Electronically Signed     RR/MEDQ  D:  04/09/2007  T:  04/09/2007  Job:  [id number]

## 2010-09-06 NOTE — Discharge Summary (Signed)
[name], [name]                 ACCOUNT NO.:  [id number]   MEDICAL RECORD NO.:  [id number]          PATIENT TYPE:  INP   LOCATION:  3733                         FACILITY:  [facility]   PHYSICIAN:  Lonia Blood, M.D.       DATE OF BIRTH:  1961-01-22   DATE OF ADMISSION:  10/14/2006  DATE OF DISCHARGE:  10/16/2006                               DISCHARGE SUMMARY   PRIMARY CARE PHYSICIAN:  [facility].   DISCHARGE DIAGNOSES:  1. Chest pain - felt to be noncardiac in nature.  2. Mediastinal mass.  Followup scheduled with Dr. Edwyna Shell for possible      removal.  3. Obesity.  4. Hypertension.  5. Chronic anemia.  6. Obstructive sleep apnea.   DISCHARGE MEDICATIONS:  1. Aspirin 81 mg daily.  2. Isosorbide dinitrate 10 mg twice a day.  3. Prilosec OTC 20 mg daily.  4. Tramadol 50 mg every 6 hours as needed for pain.  5. Diovan/HCTZ 160/12.5 mg daily.  6. Lipitor 20 mg daily.  7. Paxil 20 mg daily.  8. Tylenol 500 mg 3 times a day.   CONDITION ON DISCHARGE:  The patient was discharged in good condition.   PROCEDURES ON THIS ADMISSION:  [name] underwent a computed tomography  scan of the chest, findings of slight increase in the anterior  mediastinal mass.   CONSULTATIONS:  No consultations were obtained.   HISTORY AND PHYSICAL:  For admission history and physical, refer to  dictated H&P done by Dr. Della Goo.   HOSPITAL COURSE:  Chest pain.  [name] presented to the emergency room  with complaints of chest discomfort.  The patient was kept on telemetry  for 48 hours without any arrhythmias.  Her cardiac enzymes were  essentially within normal limits.  We felt that the patient's chest  discomfort was probably related to her anterior mediastinal mass that  has grown slightly.  The patient was provided with oral analgesics and  she was instructed to follow up with Dr. Edwyna Shell as previously scheduled.      Lonia Blood, M.D.  Electronically Signed     SL/MEDQ  D:   10/24/2006  T:  10/25/2006  Job:  [id number]   cc:   Kennith Gain, M.D.

## 2010-09-06 NOTE — Letter (Signed)
November 07, 2006   Maurice March, M.D.  [address]  [place], [place] [address]   Re:  [name], [name]                 DOB:  05/27/1960   Dear Dr. Audria Nine:   I saw this patient in the office today for followup as far as her  mediastinal mass, and there is no evidence of recurrence.  There is no  change in this.  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  I will check it again in 1 month.  She  gives a history of having some questionable hemoptysis for which she  went to the emergency room.  It sounds more like hematemesis than  hemoptysis to me.  She also complains of generalized chest pain.  She  will apparently be seeing you about this tomorrow.  She might benefit  from an upper endoscopy.  There is nothing on her CT scan chest that  would show that she has had any hemoptysis, and I again think this is  probably hematemesis.  Blood pressure is 150/99, pulse 96, respirations  20, sats 95.   Ines Bloomer, M.D.  Electronically Signed   DPB/MEDQ  D:  11/07/2006  T:  11/08/2006  Job:  [id number]

## 2010-09-06 NOTE — Cardiovascular Report (Signed)
Tara, Barrera NO.:  [id number]   MEDICAL RECORD NO.:  [id number]          PATIENT TYPE:  INP   LOCATION:  2041                         FACILITY:  [facility]   PHYSICIAN:  Peter M. Swazil[place], M.D.  DATE OF BIRTH:  08/05/1960   DATE OF PROCEDURE:  04/08/2007  DATE OF DISCHARGE:                            CARDIAC CATHETERIZATION   INDICATIONS FOR PROCEDURE:  The patient is a 50 year old black female  with history of hypertension, hyperlipidemia and family history of  coronary disease who presents with symptoms of refractory chest pain,  multiple other evaluations have been unremarkable.  She had a Cardiolite  study earlier this year which showed no evidence of ischemia.   ACCESS:  Via the right femoral artery using the standard Seldinger  technique.   EQUIPMENT:  The 6-French 4 cm right and left Judkins catheter, 6-French  pigtail catheter, 6-French arterial sheath.   MEDICATIONS:  Local anesthesia 1% Xylocaine, Versed 2 mg IV, fentanyl 50  mcg IV.   CONTRAST:  95 mL of Omnipaque.   HEMODYNAMIC DATA:  Aortic pressure was 143/88 with a mean of 113.  Left  ventricle pressure was 146 with EDP of 15 mmHg.   ANGIOGRAPHIC DATA:  The left coronary artery arises and distributes in a  dominant fashion.  The left main coronary is normal.   The left anterior descending artery and a moderate-sized diagonal branch  appear normal.   There is a large intermediate vessel which appears normal.   The left circumflex coronary extends through the AV groove into the  lateral wall and supplies three small posterolateral branches.  These  vessels are also normal.   The right coronary arises anteriorly.  It is a very small nondominant  vessel.  It tapers to termination fairly quickly after the takeoff of  the conus branch.   Left ventricular angiography performed in RAO view demonstrates normal  left ventricular size and contractility with normal systolic function.  Ejection  fraction is estimated at 60%.  There is no segmental wall  motion abnormality.   FINAL INTERPRETATION:  1. No significant atherosclerotic coronary artery disease.  2. Normal left ventricular function.   PLAN:  Based on these findings, I feel that her pain is noncardiac.  Would recommend conservative management.           ______________________________  Peter M. Swaziland, M.D.     PMJ/MEDQ  D:  04/08/2007  T:  04/09/2007  Job:  [id number]   cc:   Maurice March, M.D.  Marcellus Scott, MD

## 2010-09-06 NOTE — H&P (Signed)
NAMEALLORA, BAINS                 ACCOUNT NO.:  [id number]   MEDICAL RECORD NO.:  [id number]          PATIENT TYPE:  INP   LOCATION:  2921                         FACILITY:  [facility]   PHYSICIAN:  Marcellus Scott, MD     DATE OF BIRTH:  1960-09-14   DATE OF ADMISSION:  04/04/2007  DATE OF DISCHARGE:                              HISTORY & PHYSICAL   PRIMARY CARE PHYSICIAN:  Maurice March, M.D. at [facility].   CVTS:  Ines Bloomer, M.D.   CHIEF COMPLAINT:  Chest pain and dyspnea.   HISTORY OF PRESENT ILLNESS:  [name] is a pleasant 50 year old African  American female patient with extensive past medical history including  hypertension, morbid obesity, questionable obstructive sleep apnea,  previous pulmonary embolism who has completed anticoagulation,  dyslipidemia, COPD/asthma, who has had repeated visits to the ER with  history of chest pain.  She had a stress test done on May 06, 2006,  which had shown scar in the mid and apical segments of the anterior wall  as well as the anterior apex with mildly impaired left ventricular  function with mild hypokinesis involving the anterior wall.  In any  case, the patient says at baseline she has exertional dyspnea and  precordial and pain underneath the left breast.  This happens especially  when she is walking up hill, even small inclines.  She also gives  history of orthopnea, and leg swellings.  In any case, today she had a  busy day where she had to see her psychologist or psychiatrist and was  in job training.  Subsequently, at night, she was sitting on a chair and  was napping when sudden severe retrosternal and pain underneath the left  breast which was pressing/sharp in nature with radiation to the back  woke her up.  This was associated with worsening dyspnea.  She thought  that these symptoms were similar to the one she had when she had the  pulmonary embolism.  In any case, the symptoms did not subside  spontaneously, following which her brother brought her to the emergency  room.  The patient is status post 6 mg of morphine IV, and the pain has  subsided to an 8/10.  The patient indicates that when the pain initially  started, it was pleuritic in nature.  However, since being in the  emergency room, she says that sometimes it is worse with deep  inspiration.  However, at other times she still has persisting pain.  The patient denies any history of nausea, vomiting, dizziness or  diaphoresis.  There is no history of cough, fever or chills.   PAST MEDICAL HISTORY:  1. Repeated episodes of chest pain.  2. History of mediastinal mass suspicious for thymic cyst, that is      followed by CVTS.  3. Morbid obesity.  4. Hypertension.  5. Chronic anemia.  6. Question obstructive sleep apnea syndrome, but not on CPAP.  7. Previous pulmonary embolism in January 2008.  8. Arthritis.  9. Hyperlipidemia.  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 inhaler  30      minutes prior to exertion.   PAST SURGICAL HISTORY:  1. Left knee surgery.  2. Tubal ligation.  3. Hysterectomy.  4. Tonsillectomy.  5. Exploratory laparotomy.   PAST PSYCHIATRIC HISTORY:  1. Depression.  2. Anxiety.   ALLERGIES:  PENICILLIN, SULFA, OXYCONTIN WHICH GIVES HER NIGHTMARES.   CURRENT MEDICATIONS:  1. Cyclobenzaprine 10 mg p.o. b.i.d. p.r.n.  2. Diovan HCT 160/12.5 one p.o. daily.  3. Nabumetone 500 mg p.o. twice daily.  4. Paxil 20 mg p.o. daily.  5. Alphagan drops.  6. Lipitor 20 mg p.o. daily.  7. Aspirin 81 mg p.o. daily.  8. Hydroxyzine hydrochloride 10 mg p.o. t.i.d. p.r.n. for pruritus.  9. Imdur.  10.Isosorbide dinitrate 10 mg p.o. b.i.d.  11.Tramadol 50 mg p.o. q.6 h p.r.n.   FAMILY HISTORY:  Father died of an MI and history of CVA at age 66.   SOCIAL HISTORY:  The patient is divorced.  She is unemployed.  However,  goes for job training. She quit smoking 20 years ago.  There is no   history of alcohol abuse.  Substance abuse:  Marijuana 8 years ago.   REVIEW OF SYSTEMS:  A 14-system reviewed and apart from history of  present illness is noncontributory.   PHYSICAL EXAMINATION:  GENERAL:  [name] is a moderately built and  morbidly obese female in no obvious distress.  VITAL SIGNS:  Temperature 98, blood pressure 143/80, pulse 83,  respirations 18, saturating 98% on room air.  HEENT:  Normocephalic, atraumatic.  Pupils equal, round and reactive to  light and accommodation.  Oral cavity without oropharyngeal erythema or  thrush.  NECK:  Thick without JVD, carotid bruit, lymphadenopathy or goiter.  Supple.  LYMPHATICS:  No lymphadenopathy.  BREASTS: exam deferred.  RESPIRATORY:  Clear to auscultation bilaterally.  No chest wall  tenderness.  CARDIOVASCULAR:  First and second heart sounds are heard.  Soft, No  third and fourth heart sounds, no murmur.  ABDOMEN:  Obese.  Nontender.  No organomegaly or mass appreciated.  Bowel sounds are normally heard.  CENTRAL NERVOUS SYSTEM:  The patient is awake, alert, oriented x3 with  no focal neurological deficits.  EXTREMITIES:  Trace bipedal edema with no cyanosis or clubbing.  Peripheral pulses are symmetrically felt.  SKIN:  Without any rashes.   LABORATORY DATA:  Point of care cardiac markers are negative.  H&H on I-  stat is 13.6 and 40.  Electrolytes on I-stat is sodium 140, potassium  3.3, chloride 105, glucose 91, BUN 10, creatinine 0.9.  CT of the chest  has been reported as negative for pulmonary embolism.  No acute  findings.  Anterior and mediastinal mass is unchanged.  Stable compared  to prior exam.   Chest x-ray is no active cardiopulmonary disease.   EKG with normal sinus rhythm at 71 beats per minute, normal axis.  Some  intraventricular conduction defects.   Flattening of the T waves in the lateral leads.   ASSESSMENT/PLAN:  1. Chest pain suspicious for unstable angina.  Other differential       diagnoses includes musculoskeletal pain versus pleuritic chest pain      versus gastrointestinal/GERD.  The patient has had recurrent ER      visits with chest pain.  Considering that the patient has      significant risk factors of obesity, hyperlipidemia, obstructive      sleep apnea and strong family history of coronary artery disease,  consider further evaluation with cardiac catheterization.  Will      admit to step down.  Will cycle cardiac enzymes.  Will repeat an      echocardiogram.  Will place the patient on IV nitroglycerin and      unfractionated heparin drip.  Will continue the patient's statins      and ARBs. For cardiology consult.  2. Hypertension.  To continue Diovan HCT.  3. Dyslipidemia.  To check fasting lipids and continue Lipitor.  4. Morbid obesity.  Should be for diet and exercise and weight      reduction once her cardiac status has been addressed.  5. Hypokalemia.  To replete and follow up basic metabolic panel and      magnesium.  6. Obstructive sleep apnea: for CPAP by respiratory therapy.      Marcellus Scott, MD  Electronically Signed     AH/MEDQ  D:  04/05/2007  T:  04/05/2007  Job:  [id number]   cc:   Maurice March, M.D.  Ines Bloomer, M.D.

## 2010-09-09 NOTE — H&P (Signed)
NAMESHERRYN, POLLINO                 ACCOUNT NO.:  [id number]   MEDICAL RECORD NO.:  [id number]          PATIENT TYPE:  INP   LOCATION:  1828                         FACILITY:  [facility]   PHYSICIAN:  Mobolaji B. Bakare, M.D.DATE OF BIRTH:  1961/01/20   DATE OF ADMISSION:  05/02/2006  DATE OF DISCHARGE:                              HISTORY & PHYSICAL   PRIMARY CARE PHYSICIAN:  Unassigned; the patient attends [facility].   CHIEF COMPLAINT:  Chest pain, which started yesterday.   HISTORY OF THE PRESENTING COMPLAINT:  [name] is a 50 year old African  American female who was in her usual state of health until yesterday  morning while at work.  She developed retrosternal chest pain which was  radiating to her back and her shoulder.  This pain was sharp and  intermittent.  It was associated with shortness of breath.  The patient  left work and went home, but felt weak and tired.  She woke up from  sleep about 8 p.m. and started coughing.  She realized that she was  coughing up bloody sputum.  There is no associated fever.  She had  nausea earlier in the day and had 1 bowel movement, which was watery.  No accompanying abdominal pain.   The patient came to the emergency room and her O2 SATs were 98% on room  air.  She had a D-dimer which was elevated at 0.76; this was followed up  with a CT scan of the chest which reported tiny filling defects  suggested in the subsegmental pulmonary arteries to the right upper lobe  with then question of tiny pulmonary emboli.  Overall, this study was  degraded by the patient's very large body habitus and inability to  reproducibly hold her breath during the examination.   She does not smoke cigarettes.  There has not been any recent long-  distance travel.  The patient lives a sedentary life and she is obese.   PAST MEDICAL HISTORY:  1. Hypertension.  2. Depression.   PAST SURGICAL HISTORY:  1. Hysterectomy.  2. Surgery for torn cartilage of left  knee in June 2007.  3. Tonsillectomy.  4. Laparoscopic evaluation of pelvis.   CURRENT MEDICATIONS:  1. Cyclobenzaprine 10 mg t.i.d. p.r.n.  2. Diovan.  3. Hydrochlorothiazide 160/12.5 mg daily.  4. Nabumetone 500 mg b.i.d.  5. Paxil 20 mg daily.   ALLERGIES:  PENICILLIN causes swelling, and SULFONAMIDE.   FAMILY HISTORY:  Her daughter had a blood clot in her left forearm after  surgery some years back.   The patient's mom is alive; she has no health issues.  Her dad passed  away from complications of MI and stroke.   SOCIAL HISTORY:  She does not smoke cigarettes nor drink alcohol.  The  patient is unemployed, but she is in a job training setup.   REVIEW OF SYSTEMS:  She complained of numbness and tingling in her left  hand.  The patient is learning to use the keyboard.  There is no  orthopnea or PND.  No pedal swelling.  There is no abdominal pain.  She  had an episode of loose stool this morning accompanied with nausea.  No  dysuria, urgency or increased frequency of micturition.   PHYSICAL EXAMINATION:  INITIAL VITALS:  Temperature 97.9, blood pressure  124/73, pulse of 79, respiratory rate of 26 and O2 SATs of 98%.  GENERAL:  The patient is morbidly obese, not in respiratory distress.  HEENT:  Normocephalic, atraumatic head.  Mucous membranes moist.  NECK:  She has a short neck.  No discernable JVD.  No cervical  lymphadenopathy.  LUNGS:  Clear clinically to auscultation.  CV:  S1 and S2, regular.  No murmur, no gallop.  ABDOMEN:  Not distended, soft and nontender.  Bowel sounds present.  EXTREMITIES:  No pedal edema or calf tenderness.  Dorsalis pedis pulses  are palpable bilaterally.  CNS:  No focal neurologic deficit.   INITIAL LABORATORY DATA:  D-dimer 0.76.  Sodium 140, potassium 3.4,  chloride 102, bicarb 31, glucose 108, BUN 7, creatinine 0.79, calcium  9.0.  Hemoglobin 13.9, hematocrit 41, bicarb 29.8.   RADIOLOGIC FINDINGS:  Chest x-ray shows chronic  interstitial coarsening,  no acute findings.   CT scan of the chest as reported in the HPI.   ACCESSORY CLINICAL DATA:  EKG shows normal sinus rhythm with a heart  rate of 90 beats per minute, nonspecific ST change.   ASSESSMENT AND PLAN:  [name] is a 50 year old African American female  presenting with sudden onset of pleuritic chest pain, blood-tinged  sputum and shortness of breath.  She had an elevated D-dimer and a CT  angiogram of the chest was suspicious for tiny filling defects in  subsegmental arteries, right upper lobe.  The patient does have moderate  clinical probability for pulmonary embolism, although the CT scan  angiogram of the chest is not conclusive.  Overall, she does have  multiple risk factors including sedentary lifestyle and morbid obesity.  She has a family history of deep venous thrombosis in her daughter.  I  will anticoagulate her for likely pulmonary embolism.   ADMISSION DIAGNOSES:  1. Likely pulmonary embolism.  2. Morbid obesity.  3. Hypertension.  4. Hypokalemia.   PLAN:  Admit to telemetry floor, Lovenox 1 mg /kg subcu 12 hours,  Coumadin per pharmacy.  We will resume Diovan/hydrochlorothiazide  160/12.5 mg p.o. daily, Vicodin one to two p.o. q.4 h. p.r.n. and we  will replete potassium.      Mobolaji B. Corky Downs, M.D.  Electronically Signed     MBB/MEDQ  D:  05/03/2006  T:  05/03/2006  Job:  [id number]   cc:   Maurice March, M.D.

## 2010-09-09 NOTE — Discharge Summary (Signed)
   NAMESHONDREA, STEINERT                           ACCOUNT NO.:  [id number]   MEDICAL RECORD NO.:  [id number]                   PATIENT TYPE:  INP   LOCATION:  9105                                 [facility]ILITY:  [facility]   PHYSICIAN:  Phil D. Okey Dupre, M.D.                  DATE OF BIRTH:  30-Nov-1960   DATE OF ADMISSION:  11/18/2001  DATE OF DISCHARGE:  11/20/2001                                 DISCHARGE SUMMARY   HISTORY:  The patient is a 50 year old black female, who on the day of  admission underwent a total vaginal hysterectomy for pelvic pain, fibroids,  menorrhagia.   HOSPITAL COURSE:  She has had a remarkably uneventful postoperative course,  afebrile; started out with a hemoglobin of 13.1, hematocrit 38.2.  At  discharge her hemoglobin is stable at 11.4 and hematocrit of 33.0.  She is  passing gas, voiding well.  No significant genital bleeding.   DISCHARGE INSTRUCTIONS:  She has been given discharge instructions and is to  follow up activity and diet.  She will be seen in the Clinic in two weeks.  The pathology report has not yet returned at this point of discharge.   DISCHARGE DIAGNOSIS:  Status post total vaginal hysterectomy for fibroids,  symptomatic.                                               Phil D. Okey Dupre, M.D.    PDR/MEDQ  D:  11/20/2001  T:  11/25/2001  Job:  [id number]

## 2010-09-09 NOTE — Group Therapy Note (Signed)
NAMEKAMBRE, MESSNER NO.:  [id number]   MEDICAL RECORD NO.:  [id number]          PATIENT TYPE:  WOC   LOCATION:  [facility]                   FACILITY:  [facility]   PHYSICIAN:  Argentina Donovan, MD        DATE OF BIRTH:  1960-09-18   DATE OF SERVICE:  07/14/2004                                    CLINIC NOTE   REASON FOR VISIT:  This patient is a 50 year old black female gravida 4 para  3-0-1-3 who has had problems for 21 years since the birth of her last baby  with frequent vaginal infections, she feels from contamination from the  rectum as she not infrequently has very loose stools. On examination  because, she says, there was no repair after the birth of her last baby, the  patient has no significant perineum body, just a strip of tissue between the  vagina and anal introitus, although on digital exam the anal sphincter seems  to be fairly adequate. I talked to her about repair. We could, I think,  build up the perineal body but I am not sure this would exactly help her  symptoms at all, and it certainly would tighten up her vaginal introitus. I  think before we would undertake anything I would like her to see a general  surgeon and have him give his opinion of what reconstruction a proctologist  or a general surgeon might think about undertaking to correct this  situation.   IMPRESSION:  Complete lack of perineal body, thus secondary contamination  frequently from rectum to vagina.      PR/MEDQ  D:  07/14/2004  T:  07/14/2004  Job:  [id number]

## 2010-09-09 NOTE — Discharge Summary (Signed)
[name], [name]                 ACCOUNT NO.:  [id number]   MEDICAL RECORD NO.:  [id number]          PATIENT TYPE:  INP   LOCATION:  4729                         FACILITY:  [facility]   PHYSICIAN:  Hillery Aldo, M.D.   DATE OF BIRTH:  1961-01-14   DATE OF ADMISSION:  05/02/2006  DATE OF DISCHARGE:  05/07/2006                               DISCHARGE SUMMARY   PRIMARY CARE PHYSICIAN:  Dr. Audria Nine at [facility].   DISCHARGE DIAGNOSES:  1. Chest pain.  2. Pulmonary embolism.  3. Hypertension.  4. Morbid obesity.  5. Hypokalemia, resolved.  6. Depression.  7. Hyperlipidemia.  8. Anterior mediastinal mass.   DISCHARGE MEDICATIONS:  1. Lovenox 100 mg subcutaneously q.12 h.  2. Coumadin 12.5 mg daily or as directed by her primary care      physician.  3. Diovan/HCTZ 160/ 12.5 mg daily.  4. Paxil 20 mg daily.  5. Zocor 20 mg daily.   CONSULTATIONS:  Dr. Swaziland of cardiology.   BRIEF HISTORY OF PRESENT ILLNESS:  The patient is a morbidly obese 50-  year-old female who presented to the emergency department secondary to  retrosternal chest pain radiating to her back and shoulder.  It had  atypical features such as being sharp in nature and intermittent.  There  was some association with shortness of breath.  She presented to the  emergency department for evaluation when she discovered that she had  some hemoptysis.  For the full details of the HPI, please see the  dictated report done by Dr. Corky Downs.   PROCEDURES AND DIAGNOSTIC STUDIES:  1. Chest X-ray:  On May 02, 2006 showed stable exam with chronic      interstitial coarsening and no new or acute findings from previous      films.  2. CT angiogram on May 03, 2006 showed no large central pulmonary      embolus.  There were tiny filling defects suggested in the      subsegmental pulmonary arteries to the right upper lobe raising the      question of tiny pulmonary emboli, but, these could be artifactual      and are not  a definite finding.  Stable anterior mediastinal mass.  3. Adenosine Cardiolite on May 06, 2006 showed some ST and T-wave      depression inferolaterally on EKG.  The Cardiolite images were      reviewed by Dr. Swaziland.  There was a moderate fixed anterior defect      consistent with significant breast attenuation artifact.  No      ischemia was noted.  Ejection fraction was normal at 64%.      Recommendations were made by Dr. Swaziland for ongoing medical      treatment of pulmonary embolism and no further cardiac workup.  4. A 2-D echocardiogram on May 04, 2006 showed normal left      ventricular systolic function with an ejection fraction of 60-65%.      Left ventricular wall thickness was mildly increased.  There was      mild mitral valvular regurgitation  and mild to moderate pulmonic      regurgitation.   DISCHARGE LABORATORY DATA:  PT/INR was 21.9/1.8. White blood cell count  was 6.7, hemoglobin 14.3, hematocrit 41.5, platelets 255,000.  Sodium  was 136, potassium 4.1, chloride 101, bicarb 29, BUN 11, creatinine 0.8,  glucose 102.   HOSPITAL COURSE:  PROBLEM #1:  CHEST PAIN:  The patient was admitted with chest pain and a  diagnostic workup was undertaken which showed some evidence for  pulmonary embolism though this wasn't entirely convincing.  Cardiac  enzymes were cycled q.8h. x3 sets and she did have elevated troponin's  but no elevation of other cardiac markers.  She was put on therapeutic  dose anticoagulation with Lovenox and Coumadin.  Due to her elevated  troponin's, a cardiology consult was requested and kindly provided by  Dr. Swaziland.  A 2-D echocardiogram was obtained with findings as noted  above.  Because of her risk factors for coronary disease and her ongoing  chest pain in the setting of elevated troponin's, an adenosine  Cardiolite was done with findings as noted above.  At this point, her  chest pain is attributed to pulmonary embolism and no further  cardiac  workup has been recommended.  She should remain on full dose Lovenox  until her INR is therapeutic for 2 days at which time the Lovenox can be  discontinued.  She should have her PT/INR checked at [facility] with  adjustment of her Coumadin dose done by her primary care physician, Dr.  Audria Nine.   PROBLEM #2:  HYPERTENSION:  The patient's blood pressure remained well-  controlled during the course of her hospitalization.   PROBLEM #3:  HYPERLIPIDEMIA:  Because of her other risk factors and  chest pain, a fasting lipid panel was obtained with findings of a total  cholesterol equal to 225, triglycerides 213, HDL 41, and LDL 141.  Given  these elevations, she was started on Zocor at a dose of 20 mg daily.  She does have some very mild elevation of her transaminases and we  recommend following up with a check of her liver function studies again  in 6 weeks to ensure that treatment with Statin isn't aggravating this.   PROBLEM #4:  MILD TRANSAMINITIS:  Again, the patient's hepatic function  panel was checked and she was found to have an AST level of 58 and an  ALT level of 66.  This is less than two times the normal value.  She  likely has fatty infiltration of the liver, given her morbid obesity to  account for this finding, but further diagnostic workup can be  considered by her primary care physician as an outpatient.   PROBLEM #5:  MORBID OBESITY:  The patient was seen by the dietician in  consultation for weight loss recommendations.  The patient was given  handouts on weight loss tips and was recommended a 1600  kilocalorie  dietary plan.  She is encouraged to follow this at discharge for further  weight loss efforts.   PROBLEM #6:  HYPOKALEMIA:  The patient was appropriately repleted.   PROBLEM #7:  DEPRESSION:  The patient's depression remained stable on  her usual dose of Paxil.   DISPOSITION:  The patient is stable for discharge home.  She will be taught how to  self administer Lovenox prior to discharge.  She should  follow up with Dr. Audria Nine on May 08, 2006 for a PT/INR check and  adjustment of her Coumadin dose.  Hillery Aldo, M.D.  Electronically Signed     CR/MEDQ  D:  05/07/2006  T:  05/07/2006  Job:  [id number]   cc:   Maurice March, M.D.

## 2010-09-09 NOTE — Op Note (Signed)
NAMEFRADEL, BALDONADO                           ACCOUNT NO.:  [id number]   MEDICAL RECORD NO.:  [id number]                   PATIENT TYPE:  INP   LOCATION:  9105                                 [facility]ACILITY[facility]   PHYSICIAN:  Phil D. Okey Dupre, M.D.                  DATE OF BIRTH:  [birth year]   DATE OF PROCEDURE:  DATE OF DISCHARGE:  11/20/2001                                 OPERATIVE REPORT   PROCEDURE:  Total vaginal hysterectomy.   PREOPERATIVE DIAGNOSIS:  Symptomatic fibroids with pelvic pressure and heavy  bleeding.   POSTOPERATIVE DIAGNOSIS:  Symptomatic fibroids with pelvic pressure and  heavy bleeding.   SURGEON:  Javier Glazier. Okey Dupre, M.D.   ASSISTANTS:  Burnadette Peter, M.D. and Georgina Peer, M.D.   DESCRIPTION OF PROCEDURE:  Under satisfactory general anesthesia with the  patient in the dorsal lithotomy position, the perineum, vagina and abdomen  were prepped and draped in the usual sterile fashion manner.  Bimanual  pelvic examination revealed a uterus that could not be well-outlined because  of the habitus of the patient nor could the adnexa be palpated.  A weighted  speculum was placed in the posterior fourchette of the vagina filling the  introitus.  There was a first-degree cystocele and a second-degree  rectocele.  The interior/posterior lips of the cervix were grasped with  Lahey clamps and circumferential incision made around the entire  circumference of the cervix 1 cm from the distal end and using blunt  dissection, the tissue pushed away from the distal end by blunt dissection.  The cul-de-sac was then entered by sharp dissection and a figure-of-eight  suture for hemostasis was run from the vaginal mucosa to the perineum.  Then  serially, the pedicles were clamped, divided and ligated with 1 chromic  catgut suture ligature using Heaney clamps.  The first was uterosacrals, the  second cardinal ligaments and third, the packet containing the uterine  vessels.  At  this point, the peritoneal cavity was entered beneath the  bladder by sharp dissection.  Because of the fibroids that were now  discovered and that it enlarged the uterus to probably twice its normal size  with multiple intramural fibroids, several more lateral pedicles were  clamped, divided and ligated.  At this point, the fundus was brought out and  the fundus and the cervix was dissected away from the remaining mass of the  area that was still attached by pedicles.  At this point, the lateral  pedicles were then tied with free ties, clamped with Heaney clamps and the  tissue median excised, thus removing the remainder of the uterus.  The  lateral pedicles were ligated several times because of a slippage of the  first tie on both sides and the areas were observed and they were free of  bleeding at the end of this procedure.  The vaginal cuff  was then run with a  continuous running, locked 1 chromic and an atraumatic needle and the  uterosacral ligaments tied in the midline.  There was a bleeder in the area  at 8 o'clock on the cuff and a figure-of-eight suture was used to control  that.  All ties that were not tied at this point were relaxed in order to  see if there was any bleeding from inside of the peritoneal cavity and none  appeared.  The rest of the ties were cut short and the figure-of-eight  closed the vagina in the midline.  The vagina was then  tacked with Iodoform gauze two inches using five yards.  The Foley catheter  in the urinary bladder was draining clear urine.  The patient tolerated the  procedure well with a 400 cc blood loss and was transferred to the recovery  room in satisfactory condition.                                                 Phil D. Okey Dupre, M.D.    PDR/MEDQ  D:  11/18/2001  T:  11/22/2001  Job:  [id number]

## 2010-09-09 NOTE — Discharge Summary (Signed)
[facility]  Patient:    [name], [name]                        MRN: [id number] Adm. Date:  [id number] Disc. Date: [id number] Attending:  Phifer, Harriett Sine [place] Dictator:   Marisue Brooklyn, M.D. CC:         Marcos Eke. Hal Hope, M.D.   Discharge Summary  DATE OF BIRTH:  07/17/1960  HOME ADDRESS:  [address] [place], [place] [address]  HOME PHONE:  [phone]  DISCHARGE DIAGNOSES: 1. Chest wall pain, non-cardiac etiology. 2. Essential hypertension.  DISCHARGE MEDICATIONS: 1. Hydrochlorothiazide 25 mg a day. 2. Vioxx 25 mg one p.o. q.d.  FOLLOW-UP:  To be arranged with Marisue Brooklyn, M.D. in one week at the [facility]. She will be called and informed of the appointment. At the time of follow-up, her chest wall pain needs to be assessed if she still continues to have soreness upon palpation of her chest. A pulse oximetry is to be checked; and also, she needs an appointment with CVTS to follow up for her mediastinal, 2 cm soft tissue lesion. She needs to be informed about that, and an appointment with CVTS needs to be fixed at that time. Henceforth, she will follow up at [facility].  BRIEF HISTORY OF PRESENT ILLNESS:  She is a 50 year old, obese, African-American female with no prior cardiac history who presented with chest pain, presure-like, 10/10, radiating to the neck, back, and arm. Mostly precordial associated with shortness of breath. No orthopnea, PND. No pedal edema.  PAST MEDICAL HISTORY:  History of hypertension for 13 years, previous history of bronchitis and pneumonia, status post bilateral tubal ligation 13 years ago, status post exploratory laparotomy for abdominal pain in 1993 that was negative, history of gestational diabetes, and a questionable history of depression.  HOME MEDICATIONS:  Hydrochlorothiazide 25 mg a day.  SOCIAL HISTORY/HABITS:  History of tobacco abuse, half pack a day for  15 years. Quit 10 years ago. History of marijuana for 30 years. Quit three years ago. Moderate alcohol use. It is mostly social. She is a single mother to be remarried soon. She has three kids. Her next of kin is Mom. She has Medicaid. She works as a Medical sales representative.  FAMILY HISTORY:  Her mom is alive at 85, healthy. Dad is deceased at 50 of stroke and MI.  ALLERGIES:  PENICILLIN causes her to swell up.  PHYSICAL EXAMINATION:  VITAL SIGNS:  Temperature 98.3, heart rate 80, blood pressure 126/70, respiratory rate 17, oxygen saturation 98% on room air.  CARDIOVASCULAR:  Rate with a regular holosystolic murmur over the left sternal border. Is obese and with ______  breasts.  ADMISSION LABORATORY DATA:  White count 7.8, hemoglobin 12.4, hematocrit 35, MCV 84, platelets 254. Sodium 137, potassium 3.4, chloride 104, bicarb 29, BUN 13, creatinine 0.9, glucose 90, and calcium 9.3, AST 21, ALT 24, alkaline phosphatase 44, total bilirubin 0.5, total protein 6.7, albumin 3.5. TSH was normal. Lipid panel was pending at the time of discharge. Three consecutive CK-MBs and troponin Is were normal. TSH was 1.221.  EKG showed a normal sinus rhythm at 84 beats per minute with downsloping STs in 2, 3, aVF, V5, V6.  Chest x-ray was normal.  PROCEDURES:  She had a CT scan, spiral CT of the chest that was some to rule out pulmonary embolism. It was negative for PE. However, it showed a 2  cm soft tissue mass in the anterosuperior mediastinum. Differential considerations include adenopathy, thymoma, or less likely germ cell tumor. Mild lingular atelectasis versus scarring. This was done on October 07, 2000. The patient needs a follow-up on this, and she probably needs CVTS evaluation for this.  HOSPITAL COURSE:  #1 - CHEST PAIN:  This seemed likely to be chest wall pain. She was ruled out for an MI with negative cardiac enzymes, negative EKG changes after admission. She did have some cardiac risk  factors, such as obesity, family history of MI and stroke, hypertension, and tobacco abuse. Presently, did not appear to have any acute acute coronary syndrome. She did not feel any different with the use of antacids, too. Considering her age and body habitus, it was decided that this is non-cardiac chest wall pain. She is being discharged on aspirin and Vioxx for her chest wall pain. We also ruled it out for PE as she had slightly elevated D-dimer at 0.83.  #2 - ESSENTIAL HYPERTENSION:  It was stable. She was advised to continue hydrochlorothiazide at home dose. DD:  10/08/00 TD:  10/08/00 Job: [id number]

## 2010-09-09 NOTE — Consult Note (Signed)
NAMEBRYANNE, Barrera NO.:  [id number]   MEDICAL RECORD NO.:  [id number]          PATIENT TYPE:  INP   LOCATION:  4729                         FACILITY:  [facility]   PHYSICIAN:  Peter M. Swaziland, M.D.  DATE OF BIRTH:  23-Jun-1960   DATE OF CONSULTATION:  05/04/2006  DATE OF DISCHARGE:                                 CONSULTATION   HISTORY OF PRESENT ILLNESS:  [name] is a 50 year old black female with  history of hypertension and morbid obesity, who presents with a 2-week  history of worsening shortness of breath.  She also describes  intermittent symptoms of chest pain described as a retrosternal pressure  and pain beneath her left breast.  There is some associated nausea and  radiation to her back.  Her pain is worse with deep breath and may be  worse with cough.  She has no prior history of coronary disease.  On  admission, her D-dimer was 0.76.  CT angiogram of the chest showed was  small subsegmental emboli in the right upper lobe, although this was not  certain.  Lower extremity Dopplers were negative for DVT.  She does have  mildly elevated cardiac troponins, but normal CPK-MBs.   PAST MEDICAL HISTORY:  1. Hypertension.  2. Morbid obesity.  3. Depression.  4. Status post total abdominal hysterectomy.  5. Status post knee surgery.  6. Status post T&A.  7. History of anterior mediastinal mass, 2.4 cm, that has been stable      by CT since 2003.   ALLERGIES:  She is allergic to PENICILLIN and SULFONAMIDES.   MEDICATIONS:  1. Lovenox 100 mg subcu q.12 h.  2. Coumadin per pharmacy.  3. Potassium 40 mEq daily.  4. Avapro 300 mg per day.  5. Hydrochlorothiazide 12.5 mg per day.  6. Paxil 20 mg per day.  7. Flexeril p.r.n.   FAMILY HISTORY:  Her father died of myocardial infarction and stroke.  One daughter had a blood clot in her left arm.   SOCIAL HISTORY:  She denies tobacco or alcohol use.  She is unemployed.   PHYSICAL EXAMINATION:  GENERAL:  The  patient is an obese black female in  no distress.  VITAL SIGNS:  Blood pressure is 118/74.  Pulse is 81 and regular.  She  is afebrile.  SATs are 99% on room air.  HEENT:  She is normocephalic, atraumatic.  Pupils are equal, round and  reactive to light and accommodation.  Extraocular movements were full.  Oropharynx is clear.  NECK:  Supple without JVD, adenopathy or bruits.  LUNGS:  Clear.  CARDIAC:  Exam reveals regular rate and rhythm without gallop, murmur,  rub or click.  ABDOMEN:  Morbidly obese, soft and nontender.  EXTREMITIES:  She has no lower extremity edema.   LABORATORY DATA:  ECG shows normal sinus rhythm, nonspecific ST-T wave  changes; this is unchanged from October 2006.   Chest x-ray shows some coarse interstitial changes with no active  disease.   CT of the chest shows tiny filling defects in the subsegmental pulmonary  arteries of  right upper lobe that were suggestive, but not completely  diagnostic for pulmonary emboli.  She has a stable anterior mediastinal  mass.   White count is 6400, hemoglobin 13.2, hematocrit 38.2, platelets  243,000.  Pro time is 13.3; INR is 1.0.  Sodium is 138, potassium 3.2,  chloride 104, CO2 31, BUN of 7, creatinine 0.9, glucose of 104.  CK is  137 with 1.6 MB and 152 with 1.5 MB, then 164 with 1.6 MB.  Troponins  were 0.08, 0.10 and 0.08.  TSH was 0.47.   IMPRESSION:  1. Chest pain.  Her clinical history is suggestive of pulmonary      embolus.  However, given the lack of certainty with her CT scan, we      also need to consider possibility of coronary ischemia.  2. Hypertension.  3. Morbid obesity.  4. History of anterior mediastinal mass.  5. Depression.   PLAN:  We will follow up on her echocardiogram today.  I think, given  the uncertainty of her diagnosis, it would be reasonable to consider an  adenosine Cardiolite study to assess her coronary risks.  I expect this  study to be limited due to her obesity and I am not  sure whether the  history of her anterior mediastinal mass will create any artifact on her  images, but I think it will be valuable to rule out significant  ischemia.  We will try to schedule her for a 2-day adenosine Cardiolite  study within the next 2-3 days or prior to her discharge.   Thank you.           ______________________________  Peter M. Swaziland, M.D.     PMJ/MEDQ  D:  05/04/2006  T:  05/05/2006  Job:  [id number]   cc:   Hillery Aldo, M.D.  [facility]

## 2010-11-16 IMAGING — CR DG KNEE COMPLETE 4+V*L*
4 series · 4 of 4 positions shown · non-contrast
Comparison: MRI April 2005

CLINICAL DATA: Pain

LEFT KNEE - COMPLETE 4+ VIEW

[t knee ap left]
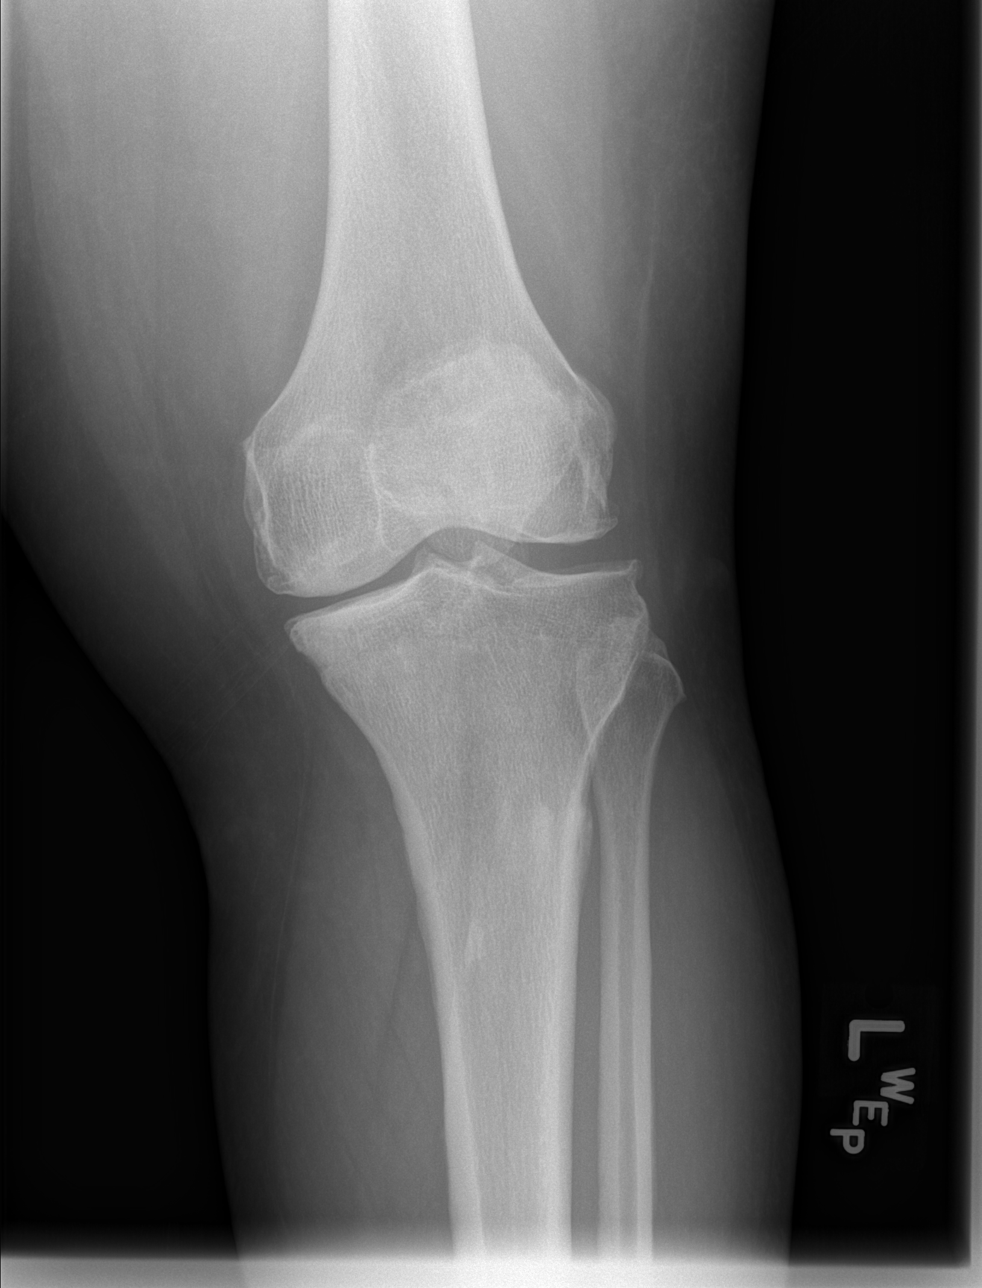

[t knee oblique left (1 of 2)]
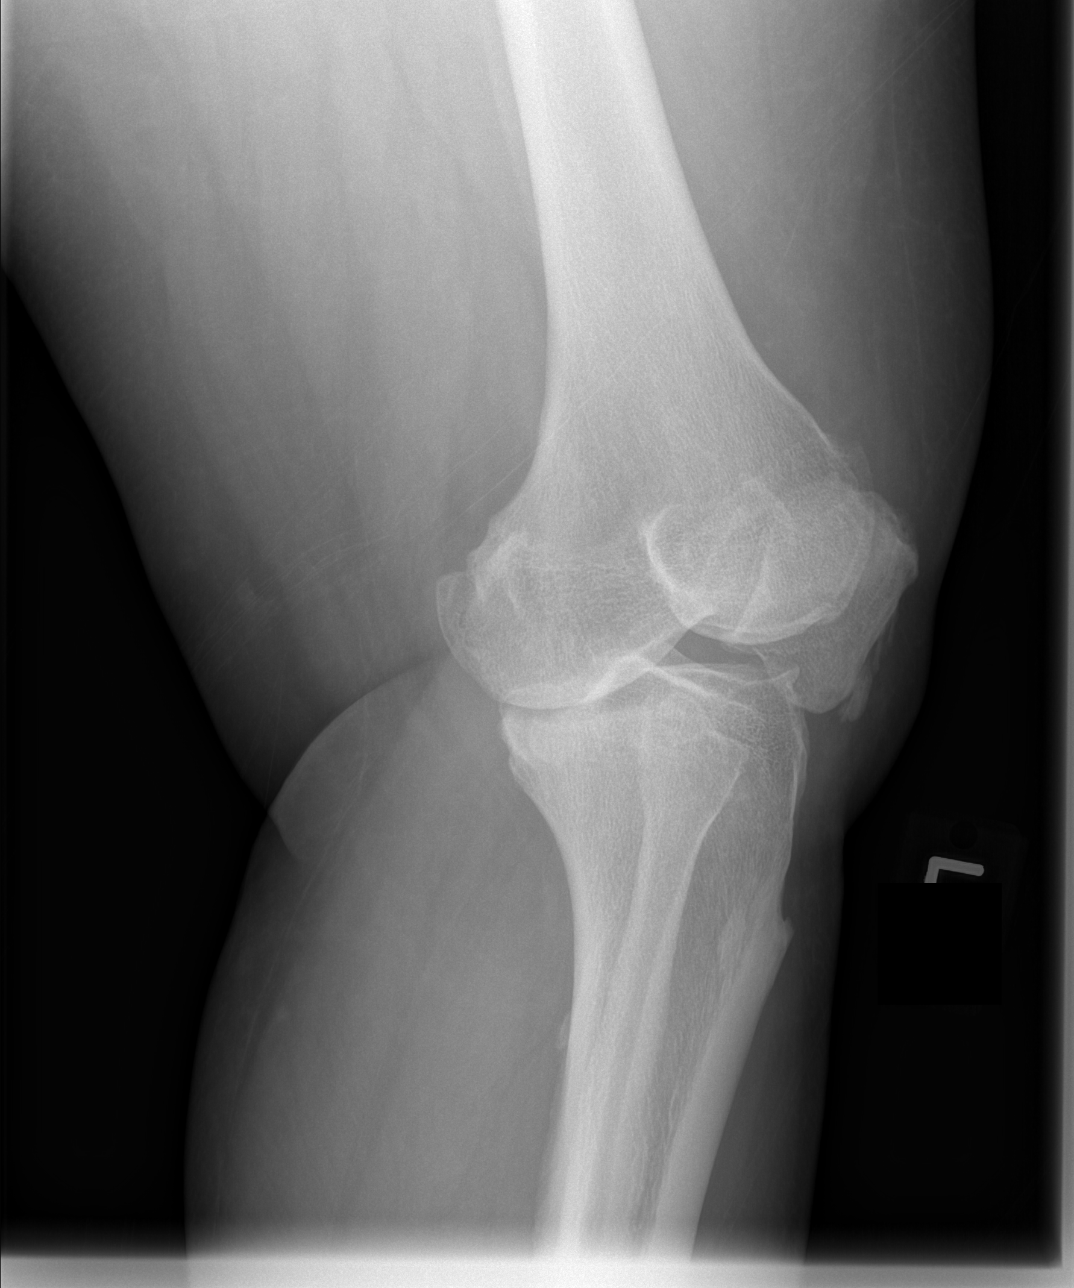

[t knee oblique left (2 of 2)]
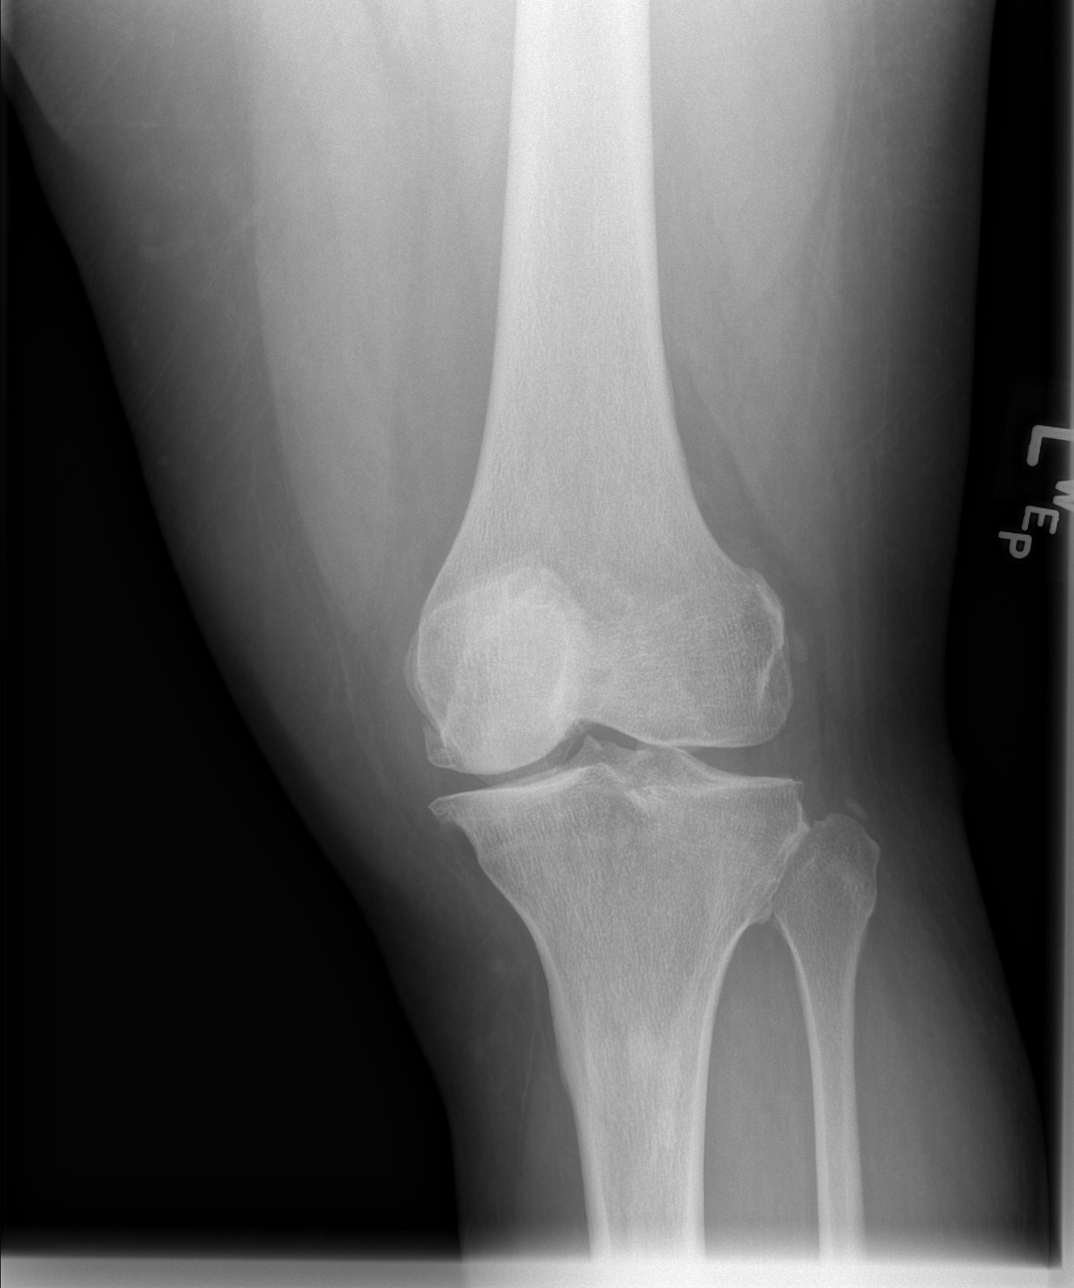

[t knee lat left]
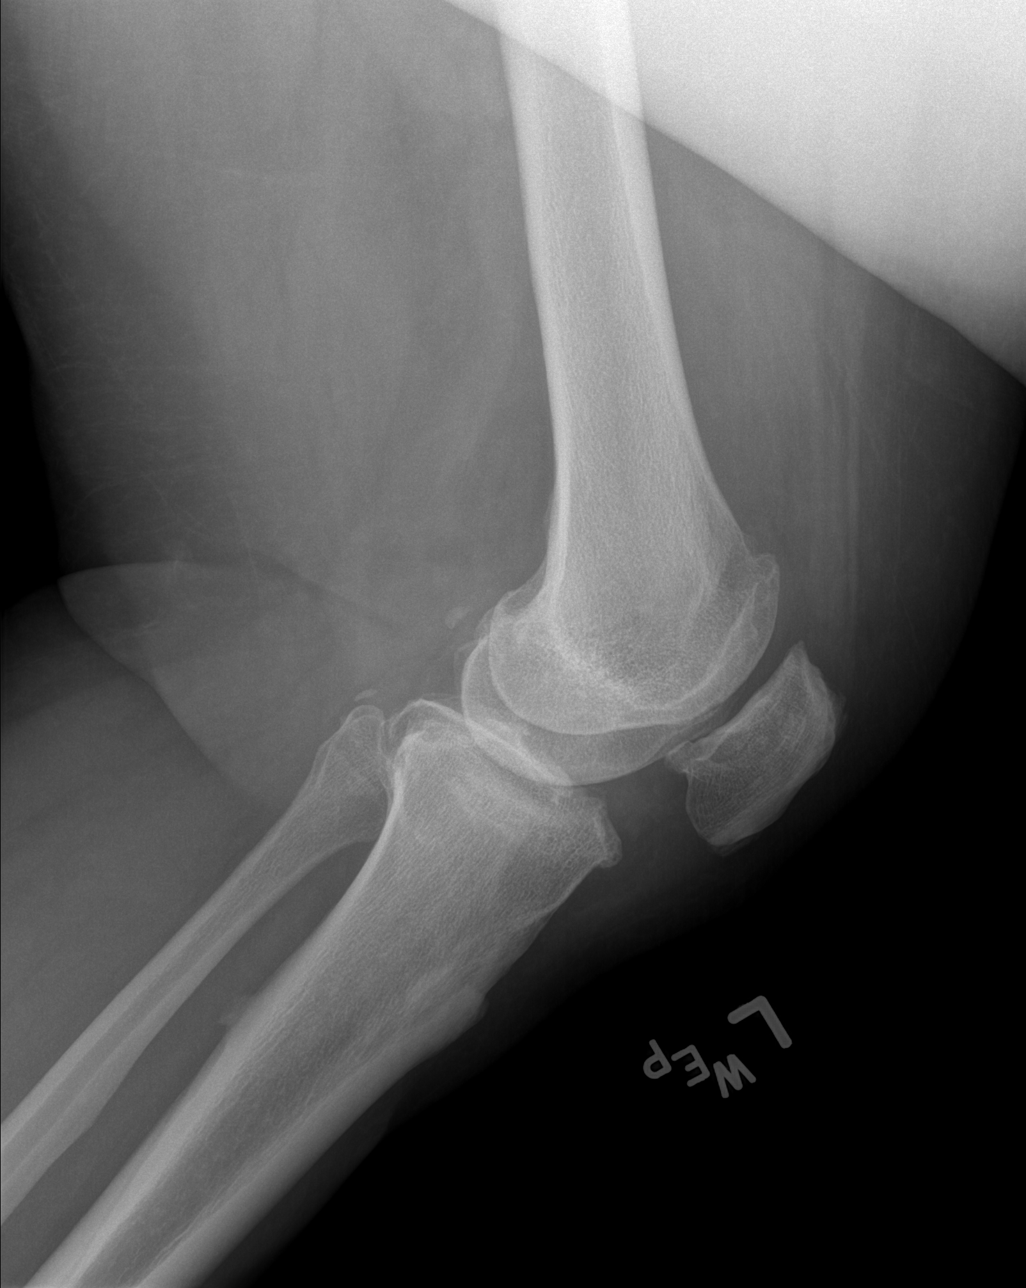

[4 of 4 positions shown; findings below may reference images not displayed]

FINDINGS: There is a small joint effusion.  There is
tricompartmental osteoarthritis with joint space narrowing and
marginal osteophytes.  This is most pronounced in the medial
weightbearing compartment patellofemoral joint.  No evidence of
fracture or other focal lesion.
IMPRESSION: Osteoarthritis and joint effusion.

## 2010-12-11 ENCOUNTER — Emergency Department (HOSPITAL_COMMUNITY)
Admission: EM | Admit: 2010-12-11 | Discharge: 2010-12-11 | Disposition: A | Discharge status: Home / Self Care | Payer: Medicare Other | Attending: Emergency Medicine | Admitting: Emergency Medicine

## 2010-12-11 DIAGNOSIS — E119 Type 2 diabetes mellitus without complications: Secondary | ICD-10-CM | POA: Insufficient documentation

## 2010-12-11 DIAGNOSIS — M549 Dorsalgia, unspecified: Secondary | ICD-10-CM | POA: Insufficient documentation

## 2010-12-11 DIAGNOSIS — M79609 Pain in unspecified limb: Secondary | ICD-10-CM | POA: Insufficient documentation

## 2010-12-11 DIAGNOSIS — I10 Essential (primary) hypertension: Secondary | ICD-10-CM | POA: Insufficient documentation

## 2010-12-11 DIAGNOSIS — R110 Nausea: Secondary | ICD-10-CM | POA: Insufficient documentation

## 2010-12-11 DIAGNOSIS — IMO0002 Reserved for concepts with insufficient information to code with codable children: Secondary | ICD-10-CM | POA: Insufficient documentation

## 2010-12-11 DIAGNOSIS — M129 Arthropathy, unspecified: Secondary | ICD-10-CM | POA: Insufficient documentation

## 2010-12-11 DIAGNOSIS — Z79899 Other long term (current) drug therapy: Secondary | ICD-10-CM | POA: Insufficient documentation

## 2011-01-13 ENCOUNTER — Emergency Department (HOSPITAL_COMMUNITY): Payer: Medicare Other

## 2011-01-13 ENCOUNTER — Emergency Department (HOSPITAL_COMMUNITY)
Admission: EM | Admit: 2011-01-13 | Discharge: 2011-01-14 | Disposition: A | Discharge status: Home / Self Care | Payer: Medicare Other | Attending: Emergency Medicine | Admitting: Emergency Medicine

## 2011-01-13 DIAGNOSIS — Z79899 Other long term (current) drug therapy: Secondary | ICD-10-CM | POA: Insufficient documentation

## 2011-01-13 DIAGNOSIS — I10 Essential (primary) hypertension: Secondary | ICD-10-CM | POA: Insufficient documentation

## 2011-01-13 DIAGNOSIS — R0602 Shortness of breath: Secondary | ICD-10-CM | POA: Insufficient documentation

## 2011-01-13 DIAGNOSIS — E119 Type 2 diabetes mellitus without complications: Secondary | ICD-10-CM | POA: Insufficient documentation

## 2011-01-13 DIAGNOSIS — M129 Arthropathy, unspecified: Secondary | ICD-10-CM | POA: Insufficient documentation

## 2011-01-13 DIAGNOSIS — M79609 Pain in unspecified limb: Secondary | ICD-10-CM | POA: Insufficient documentation

## 2011-01-13 DIAGNOSIS — E669 Obesity, unspecified: Secondary | ICD-10-CM | POA: Insufficient documentation

## 2011-01-13 DIAGNOSIS — R079 Chest pain, unspecified: Secondary | ICD-10-CM | POA: Insufficient documentation

## 2011-01-13 DIAGNOSIS — Z86711 Personal history of pulmonary embolism: Secondary | ICD-10-CM | POA: Insufficient documentation

## 2011-01-13 LAB — DIFFERENTIAL
Basophils Absolute: 0 10*3/uL (ref 0.0–0.1)
Basophils Relative: 0 % (ref 0–1)
Eosinophils Absolute: 0.3 10*3/uL (ref 0.0–0.7)
Eosinophils Relative: 3 % (ref 0–5)
Lymphocytes Relative: 40 % (ref 12–46)
Lymphs Abs: 4 10*3/uL (ref 0.7–4.0)
Monocytes Absolute: 0.7 10*3/uL (ref 0.1–1.0)
Monocytes Relative: 8 % (ref 3–12)
Neutro Abs: 4.8 10*3/uL (ref 1.7–7.7)
Neutrophils Relative %: 49 % (ref 43–77)

## 2011-01-13 LAB — POCT I-STAT, CHEM 8
BUN: 9 mg/dL (ref 6–23)
Calcium, Ion: 1.22 mmol/L (ref 1.12–1.32)
Chloride: 99 mEq/L (ref 96–112)
Creatinine, Ser: 0.7 mg/dL (ref 0.50–1.10)
Glucose, Bld: 143 mg/dL — ABNORMAL HIGH (ref 70–99)
HCT: 41 % (ref 36.0–46.0)
Hemoglobin: 13.9 g/dL (ref 12.0–15.0)
Potassium: 3.6 mEq/L (ref 3.5–5.1)
Sodium: 139 mEq/L (ref 135–145)
TCO2: 30 mmol/L (ref 0–100)

## 2011-01-13 LAB — CBC
HCT: 38.6 % (ref 36.0–46.0)
Hemoglobin: 13 g/dL (ref 12.0–15.0)
MCH: 29.1 pg (ref 26.0–34.0)
MCHC: 33.7 g/dL (ref 30.0–36.0)
MCV: 86.5 fL (ref 78.0–100.0)
Platelets: 251 10*3/uL (ref 150–400)
RBC: 4.46 MIL/uL (ref 3.87–5.11)
RDW: 13.3 % (ref 11.5–15.5)
WBC: 9.8 10*3/uL (ref 4.0–10.5)

## 2011-01-13 LAB — POCT I-STAT TROPONIN I: Troponin i, poc: 0.02 ng/mL (ref 0.00–0.08)

## 2011-01-14 LAB — COMPREHENSIVE METABOLIC PANEL
ALT: 37 U/L — ABNORMAL HIGH (ref 0–35)
AST: 23 U/L (ref 0–37)
Albumin: 3.6 g/dL (ref 3.5–5.2)
Alkaline Phosphatase: 66 U/L (ref 39–117)
BUN: 9 mg/dL (ref 6–23)
CO2: 31 mEq/L (ref 19–32)
Calcium: 9.7 mg/dL (ref 8.4–10.5)
Chloride: 100 mEq/L (ref 96–112)
Creatinine, Ser: 0.78 mg/dL (ref 0.50–1.10)
GFR calc Af Amer: >60 mL/min (ref >60)
GFR calc non Af Amer: >60 mL/min (ref >60)
Glucose, Bld: 142 mg/dL — ABNORMAL HIGH (ref 70–99)
Potassium: 3.4 mEq/L — ABNORMAL LOW (ref 3.5–5.1)
Sodium: 140 mEq/L (ref 135–145)
Total Bilirubin: 0.2 mg/dL — ABNORMAL LOW (ref 0.3–1.2)
Total Protein: 6.9 g/dL (ref 6.0–8.3)

## 2011-01-14 LAB — POCT I-STAT TROPONIN I: Troponin i, poc: 0.02 ng/mL (ref 0.00–0.08)

## 2011-01-14 LAB — D-DIMER, QUANTITATIVE (NOT AT ARMC): D-Dimer, Quant: 0.36 ug/mL-FEU (ref 0.00–0.48)

## 2011-01-14 MED ORDER — IOHEXOL 300 MG/ML  SOLN
100.0000 mL | Freq: Once | INTRAMUSCULAR | Status: AC | PRN
  Administered 2011-01-14: 100 mL via INTRAVENOUS

## 2011-01-17 LAB — CBC
HCT: 37.9
HCT: 39.8
Hemoglobin: 13.4
Hemoglobin: 13.9
MCHC: 34.8
MCHC: 35.2
MCV: 85.5
MCV: 86
Platelets: 241
Platelets: 287
RBC: 4.44
RBC: 4.63
RDW: 12.8
RDW: 13.2
WBC: 8.2
WBC: 8.7

## 2011-01-17 LAB — DIFFERENTIAL
Basophils Absolute: 0
Basophils Absolute: 0.1
Basophils Relative: 1
Basophils Relative: 1
Eosinophils Absolute: 0.6
Eosinophils Absolute: 0.7
Eosinophils Relative: 8 — ABNORMAL HIGH
Eosinophils Relative: 8 — ABNORMAL HIGH
Lymphocytes Relative: 40
Lymphocytes Relative: 42
Lymphs Abs: 3.3
Lymphs Abs: 3.7
Monocytes Absolute: 0.6
Monocytes Absolute: 0.6
Monocytes Relative: 7
Monocytes Relative: 7
Neutro Abs: 3.6
Neutro Abs: 3.7
Neutrophils Relative %: 42 — ABNORMAL LOW
Neutrophils Relative %: 45

## 2011-01-17 LAB — POCT I-STAT, CHEM 8
BUN: 7
BUN: 7
Calcium, Ion: 1.16
Calcium, Ion: 1.21
Chloride: 100
Chloride: 99
Creatinine, Ser: 0.9
Creatinine, Ser: 1.1
Glucose, Bld: 90
Glucose, Bld: 98
HCT: 41
HCT: 42
Hemoglobin: 13.9
Hemoglobin: 14.3
Potassium: 3.3 — ABNORMAL LOW
Potassium: 3.3 — ABNORMAL LOW
Sodium: 139
Sodium: 140
TCO2: 32
TCO2: 32

## 2011-01-17 LAB — D-DIMER, QUANTITATIVE: D-Dimer, Quant: 0.32

## 2011-01-17 LAB — POCT CARDIAC MARKERS
CKMB, poc: 1.2
CKMB, poc: 1.4
Myoglobin, poc: 61.9
Myoglobin, poc: 94.8
Operator id: 196461
Operator id: 234501
Troponin i, poc: <0.05
Troponin i, poc: <0.05

## 2011-01-17 LAB — D-DIMER, QUANTITATIVE (NOT AT ARMC): D-Dimer, Quant: 0.44

## 2011-01-19 LAB — URINALYSIS, ROUTINE W REFLEX MICROSCOPIC
Bilirubin Urine: NEGATIVE
Glucose, UA: NEGATIVE
Hgb urine dipstick: NEGATIVE
Ketones, ur: NEGATIVE
Nitrite: NEGATIVE
Protein, ur: NEGATIVE
Specific Gravity, Urine: 1.03
Urobilinogen, UA: 0.2
pH: 6

## 2011-01-19 LAB — DIFFERENTIAL
Basophils Absolute: 0.1
Basophils Relative: 1
Eosinophils Absolute: 0.4
Eosinophils Relative: 6 — ABNORMAL HIGH
Lymphocytes Relative: 46
Lymphs Abs: 3.1
Monocytes Absolute: 0.5
Monocytes Relative: 8
Neutro Abs: 2.7
Neutrophils Relative %: 41 — ABNORMAL LOW

## 2011-01-19 LAB — CBC
HCT: 39.3
Hemoglobin: 13.4
MCHC: 34.1
MCV: 86.3
Platelets: 259
RBC: 4.55
RDW: 13.7
WBC: 6.7

## 2011-01-19 LAB — D-DIMER, QUANTITATIVE (NOT AT ARMC): D-Dimer, Quant: 0.39

## 2011-01-19 LAB — COMPREHENSIVE METABOLIC PANEL
ALT: 34
AST: 26
Albumin: 3.7
Alkaline Phosphatase: 55
BUN: 9
CO2: 30
Calcium: 9.1
Chloride: 103
Creatinine, Ser: 0.9
GFR calc Af Amer: >60
GFR calc non Af Amer: >60
Glucose, Bld: 93
Potassium: 3 — ABNORMAL LOW
Sodium: 141
Total Bilirubin: 0.8
Total Protein: 6.9

## 2011-01-19 LAB — POCT CARDIAC MARKERS
CKMB, poc: 2
Myoglobin, poc: 94.3
Operator id: 234501
Troponin i, poc: <0.05

## 2011-01-19 LAB — LIPASE, BLOOD: Lipase: 27

## 2011-01-24 IMAGING — CT CT ANGIO CHEST
2 of 6 series · 19 of 36 positions shown · IV contrast (APPLIED)
Comparison: 09/20/2008

CLINICAL DATA: Short of breath.  Hemoptysis.  Cough and chest
pain.  Previous history pulmonary embolism.

CT ANGIOGRAPHY CHEST WITH CONTRAST
TECHNIQUE: Multidetector CT imaging of the chest was performed
using the standard protocol during bolus administration of
intravenous contrast. Multiplanar CT image reconstructions
including MIPs were obtained to evaluate the vascular anatomy.
Contrast: 100 ml Omnipaque 350

[Series 8: pulm embolism 1.0 b25f thins · axial · 0.56mm/px · z∈[-227,-31]mm · 18 of 218 slices shown]
[im 11/218  lung]
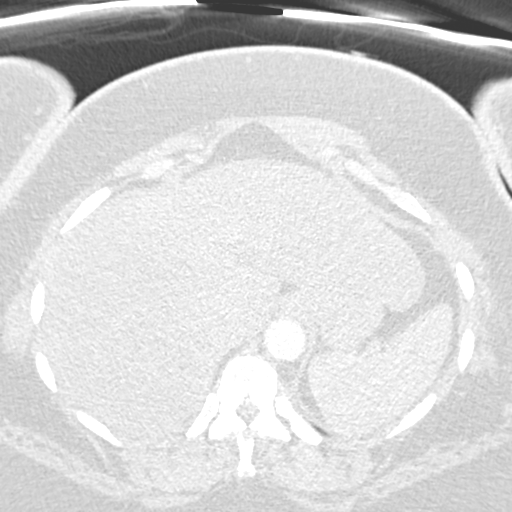
[im 22/218  mediastinal]
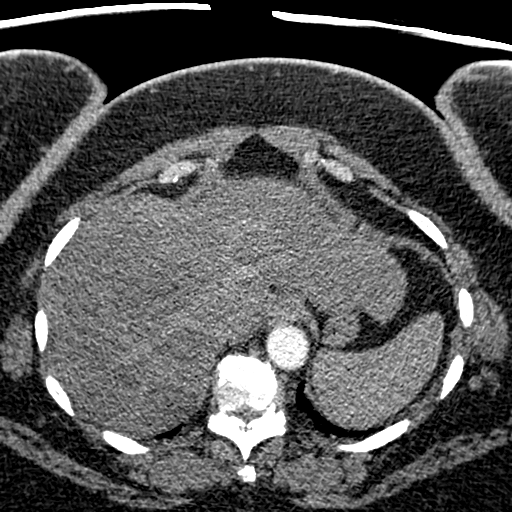
[im 33/218  lung]
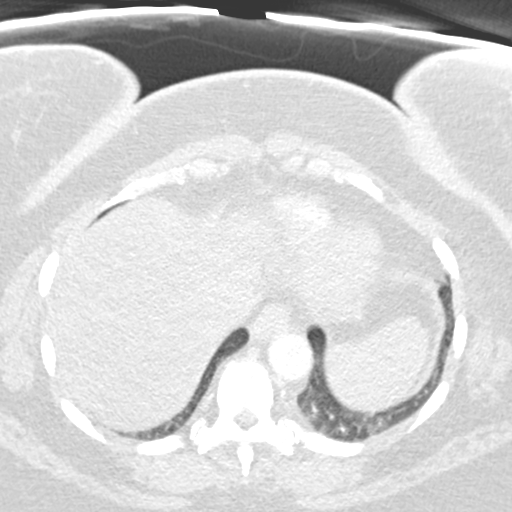
[im 44/218  mediastinal]
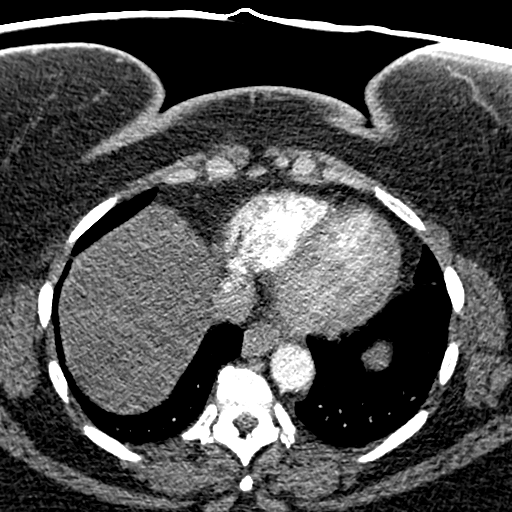
[im 55/218  lung]
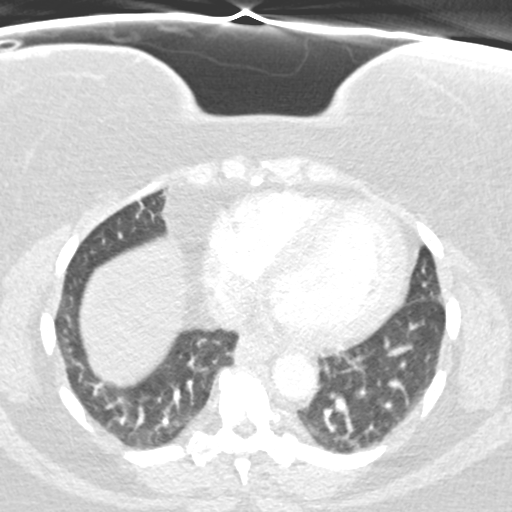
[im 66/218  mediastinal]
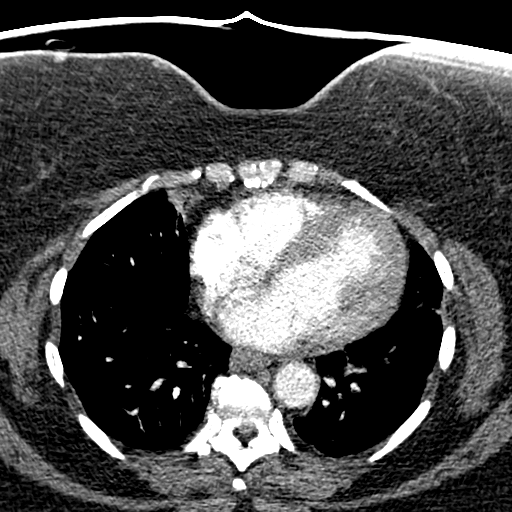
[im 76/218  lung]
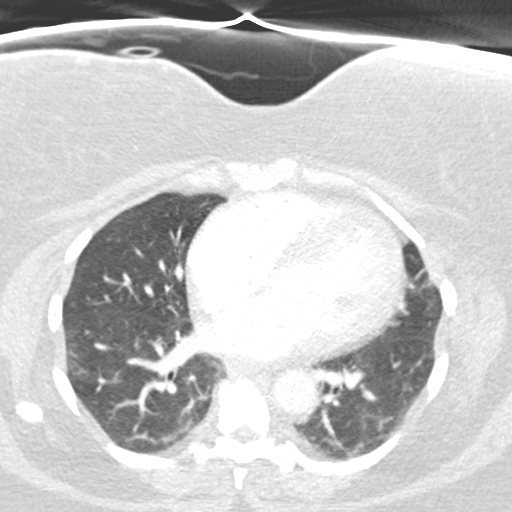
[im 87/218  mediastinal]
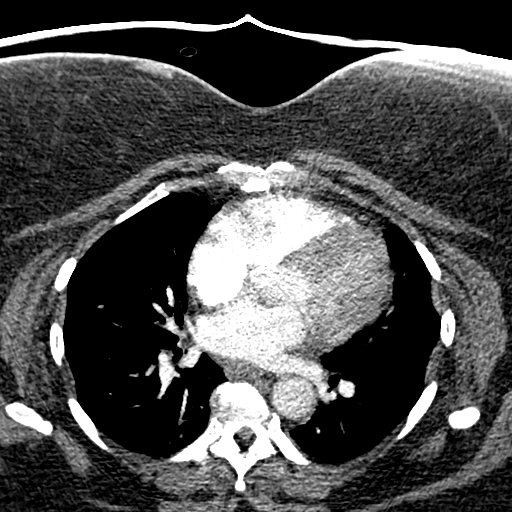
[im 98/218  lung]
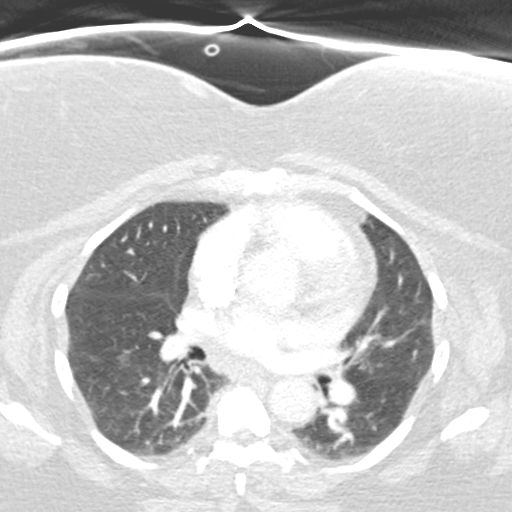
[im 120/218  mediastinal]
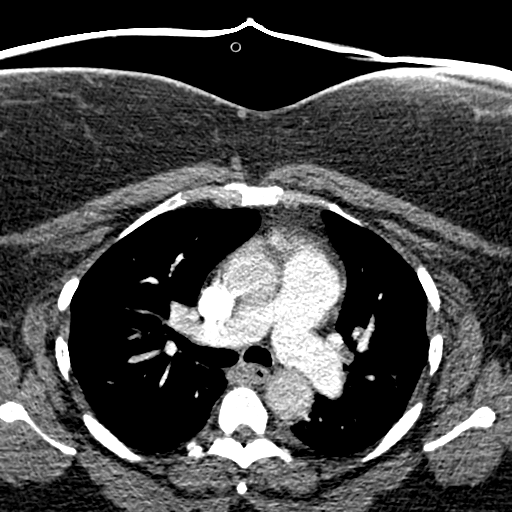
[im 131/218  lung]
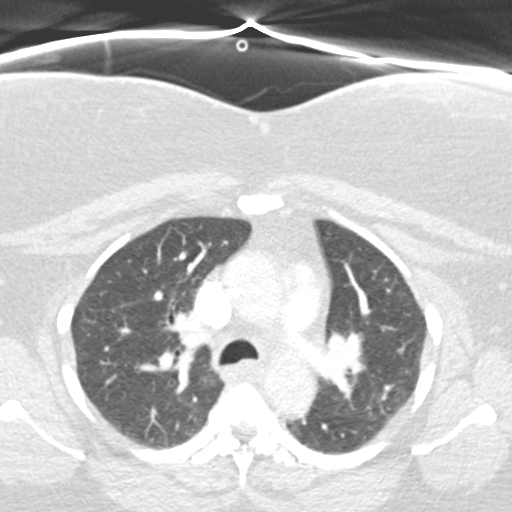
[im 142/218  mediastinal]
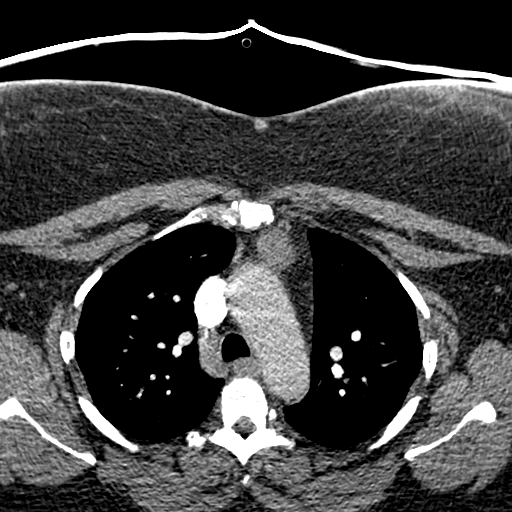
[im 152/218  lung]
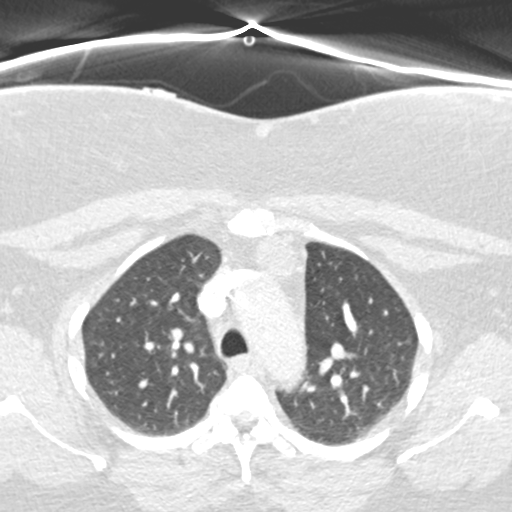
[im 163/218  mediastinal]
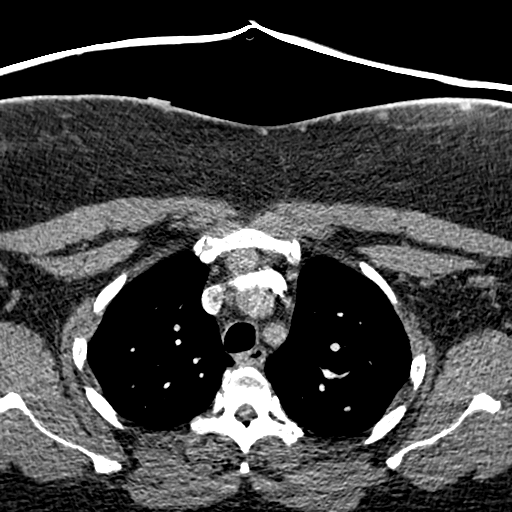
[im 174/218  lung]
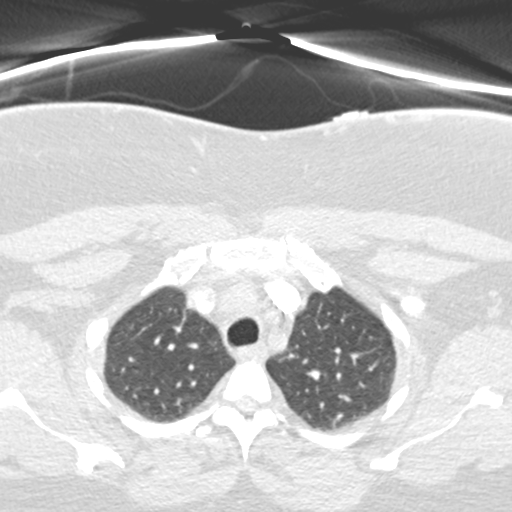
[im 185/218  mediastinal]
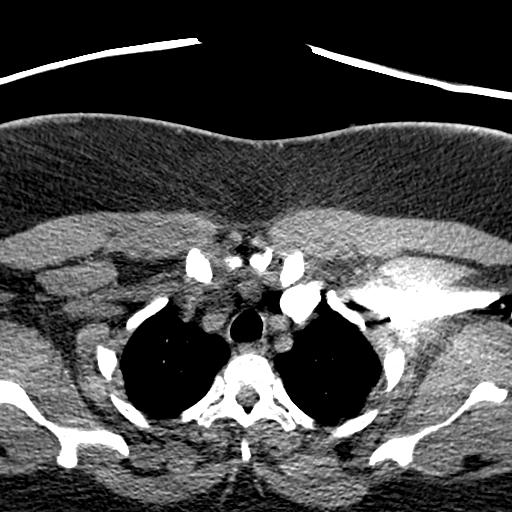
[im 196/218  lung]
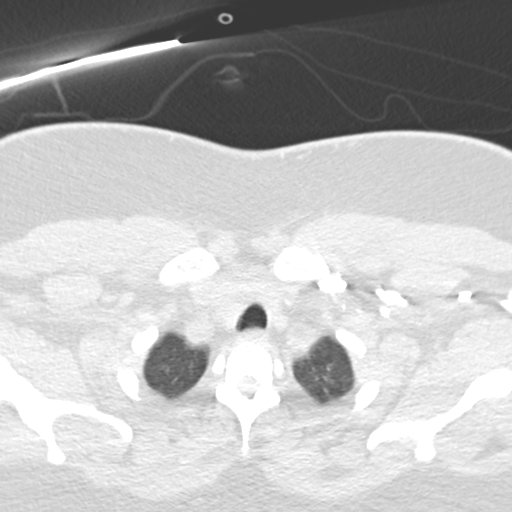
[im 207/218  mediastinal]
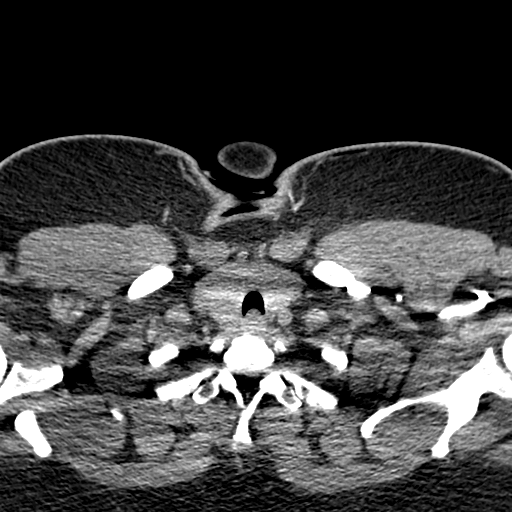

[Series 602: cor angio chest · coronal · 0.56mm/px · 1 of 86 slices shown]
[im 43/86  mediastinal]
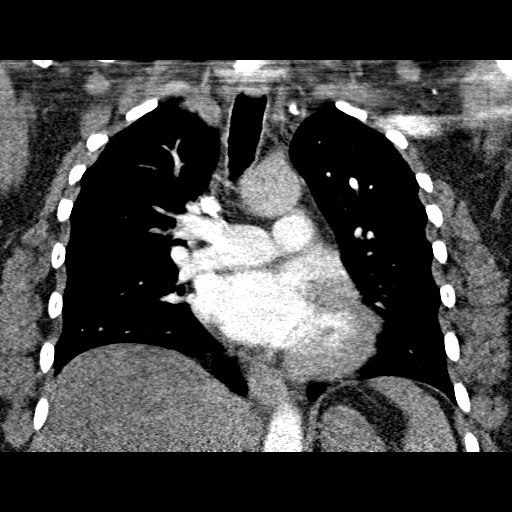

[19 of 36 positions shown; findings below may reference images not displayed]

FINDINGS: Satisfactory opacification of pulmonary arteries is
seen, and there is no evidence of acute pulmonary emboli.  No
evidence of thoracic aortic aneurysm.  Soft tissue nodules are
again seen within the anterior mediastinum in the prevascular space
measuring 1.6 cm to 0.4 cm, which are stable.  No other masses or
areas of adenopathy are seen within the thorax.

There is no evidence of pleural or pericardial effusion.  There is
no evidence of pulmonary infiltrate or mass.

Review of the MIP images confirms the above findings.
IMPRESSION: 1.  No evidence of pulmonary embolism or other acute findings.
2.  Stable soft tissue nodules in the prevascular anterior
mediastinum.

## 2011-01-24 IMAGING — CR DG CHEST 2V
2 series · 2 of 2 positions shown · non-contrast
Comparison: [DATE]

CLINICAL DATA: Chest pain, cough, congestion

CHEST - 2 VIEW

[w chest pa]
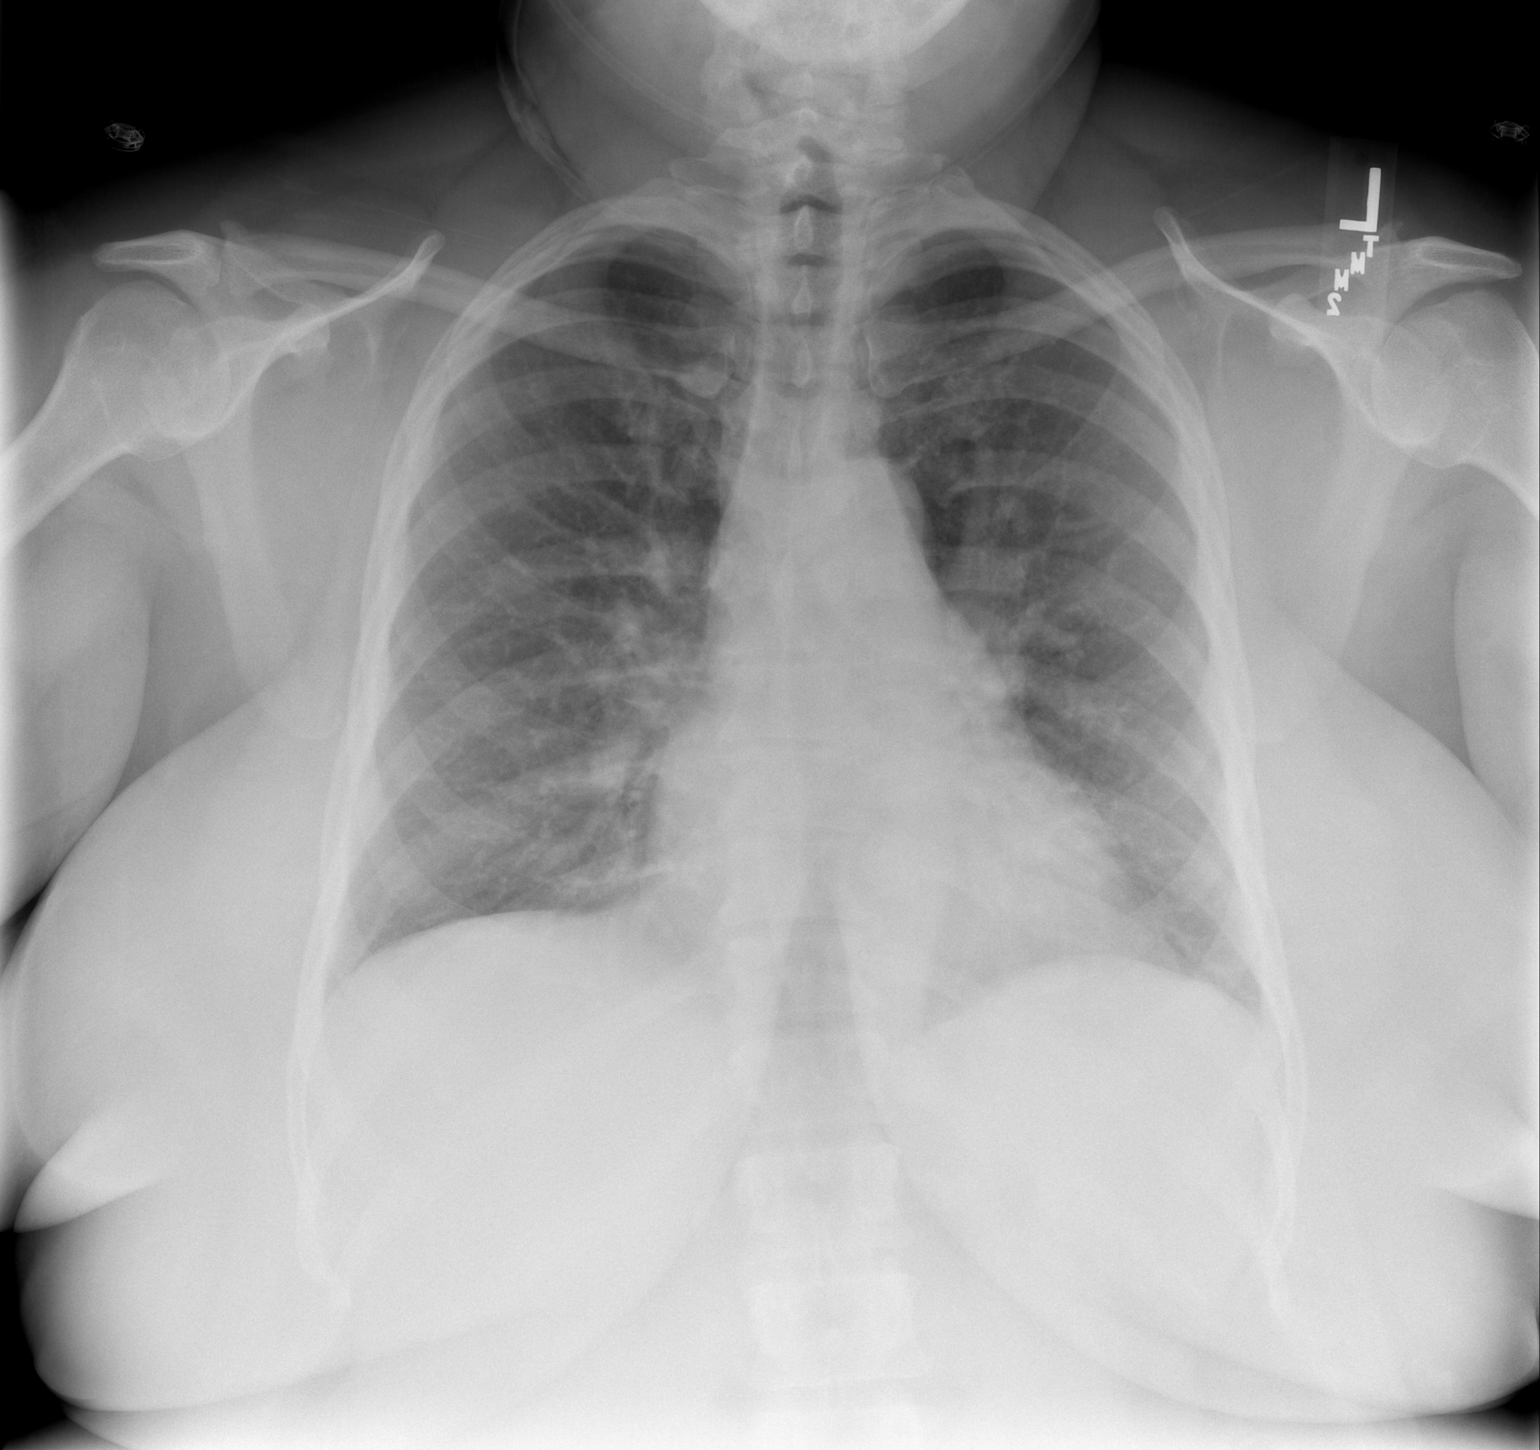

[w chest lat]
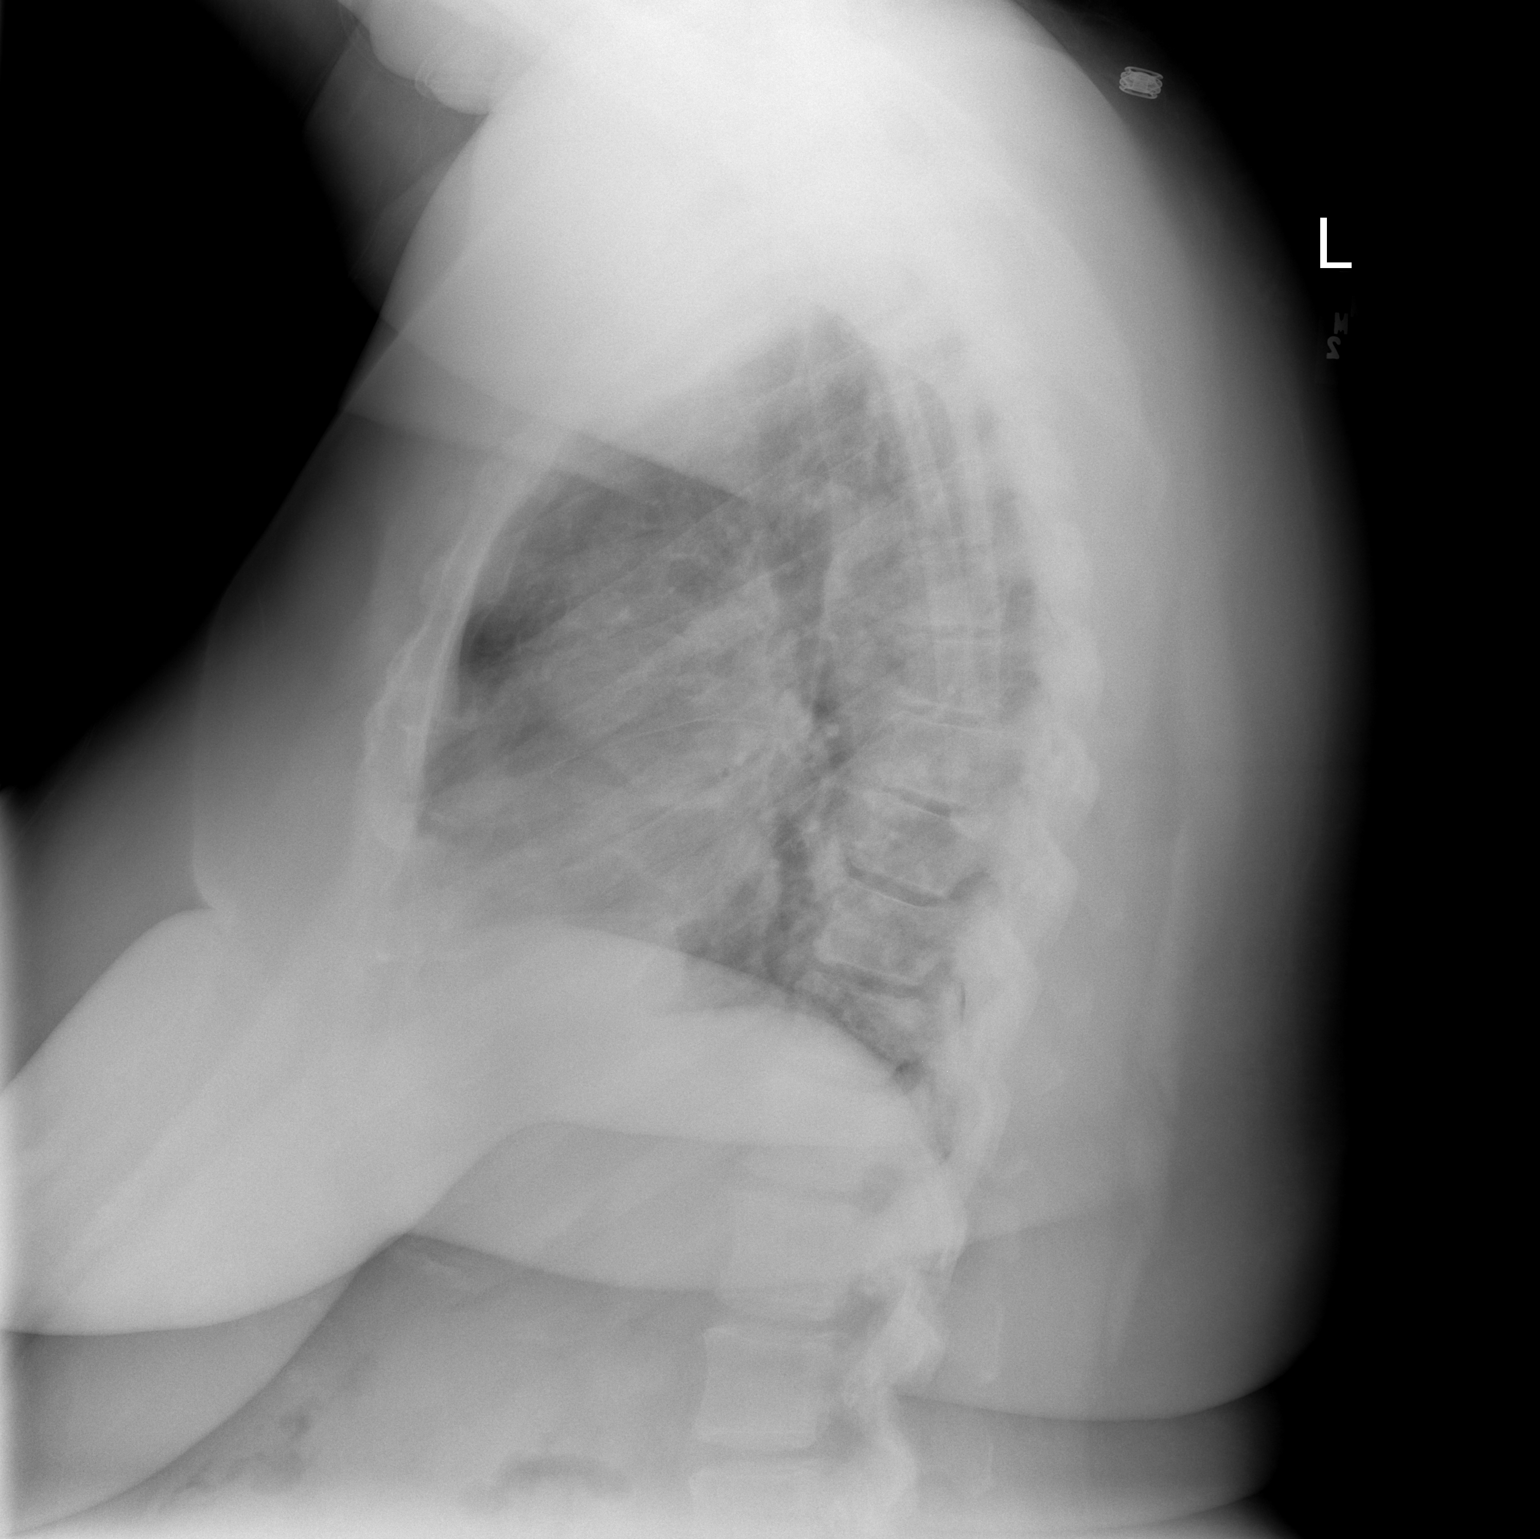

[2 of 2 positions shown; findings below may reference images not displayed]

FINDINGS: Limited exam because of body habitus.  Mild cardiac
enlargement and vascular congestion.  Chronic basilar and right
middle lobe atelectasis.  No new superimposed definite pneumonia,
edema, effusion or pneumothorax.  Overall stable exam.
IMPRESSION: Stable chest exam without acute interval change

## 2011-01-27 LAB — BASIC METABOLIC PANEL
BUN: 8
CO2: 27
Calcium: 8.9
Chloride: 102
Creatinine, Ser: 0.82
GFR calc Af Amer: >60
GFR calc non Af Amer: >60
Glucose, Bld: 104 — ABNORMAL HIGH
Potassium: 3.4 — ABNORMAL LOW
Sodium: 139

## 2011-01-27 LAB — COMPREHENSIVE METABOLIC PANEL
ALT: 52 — ABNORMAL HIGH
AST: 42 — ABNORMAL HIGH
Albumin: 3.2 — ABNORMAL LOW
Alkaline Phosphatase: 52
BUN: 7
CO2: 28
Calcium: 8.9
Chloride: 104
Creatinine, Ser: 0.82
GFR calc Af Amer: >60
GFR calc non Af Amer: >60
Glucose, Bld: 119 — ABNORMAL HIGH
Potassium: 3.9
Sodium: 140
Total Bilirubin: 0.5
Total Protein: 6.2

## 2011-01-27 LAB — CBC
HCT: 36.7
Hemoglobin: 12.4
MCHC: 33.9
MCV: 87.2
Platelets: 246
RBC: 4.21
RDW: 13.1
WBC: 7.2

## 2011-01-27 LAB — MAGNESIUM: Magnesium: 2

## 2011-01-27 LAB — HEPARIN LEVEL (UNFRACTIONATED): Heparin Unfractionated: 0.44

## 2011-01-27 LAB — PHOSPHORUS: Phosphorus: 3.7

## 2011-01-28 ENCOUNTER — Emergency Department (INDEPENDENT_AMBULATORY_CARE_PROVIDER_SITE_OTHER): Payer: Medicare Other

## 2011-01-28 ENCOUNTER — Emergency Department (HOSPITAL_BASED_OUTPATIENT_CLINIC_OR_DEPARTMENT_OTHER)
Admission: EM | Admit: 2011-01-28 | Discharge: 2011-01-28 | Disposition: A | Discharge status: Home / Self Care | Payer: Medicare Other | Attending: Emergency Medicine | Admitting: Emergency Medicine

## 2011-01-28 ENCOUNTER — Encounter (HOSPITAL_BASED_OUTPATIENT_CLINIC_OR_DEPARTMENT_OTHER): Payer: Self-pay | Admitting: *Deleted

## 2011-01-28 DIAGNOSIS — Z79899 Other long term (current) drug therapy: Secondary | ICD-10-CM | POA: Insufficient documentation

## 2011-01-28 DIAGNOSIS — F341 Dysthymic disorder: Secondary | ICD-10-CM | POA: Insufficient documentation

## 2011-01-28 DIAGNOSIS — R0602 Shortness of breath: Secondary | ICD-10-CM | POA: Insufficient documentation

## 2011-01-28 DIAGNOSIS — J45901 Unspecified asthma with (acute) exacerbation: Secondary | ICD-10-CM | POA: Insufficient documentation

## 2011-01-28 DIAGNOSIS — I10 Essential (primary) hypertension: Secondary | ICD-10-CM | POA: Insufficient documentation

## 2011-01-28 DIAGNOSIS — R079 Chest pain, unspecified: Secondary | ICD-10-CM

## 2011-01-28 DIAGNOSIS — E119 Type 2 diabetes mellitus without complications: Secondary | ICD-10-CM | POA: Insufficient documentation

## 2011-01-28 HISTORY — DX: Major depressive disorder, single episode, unspecified: F32.9

## 2011-01-28 HISTORY — DX: Anxiety disorder, unspecified: F41.9

## 2011-01-28 HISTORY — DX: Other pulmonary embolism without acute cor pulmonale: I26.99

## 2011-01-28 HISTORY — DX: Depression, unspecified: F32.A

## 2011-01-28 HISTORY — DX: Angina pectoris, unspecified: I20.9

## 2011-01-28 LAB — COMPREHENSIVE METABOLIC PANEL
ALT: 33 U/L (ref 0–35)
AST: 23 U/L (ref 0–37)
Albumin: 3.7 g/dL (ref 3.5–5.2)
Alkaline Phosphatase: 61 U/L (ref 39–117)
BUN: 11 mg/dL (ref 6–23)
CO2: 27 mEq/L (ref 19–32)
Calcium: 9.7 mg/dL (ref 8.4–10.5)
Chloride: 103 mEq/L (ref 96–112)
Creatinine, Ser: 0.7 mg/dL (ref 0.50–1.10)
GFR calc Af Amer: >90 mL/min (ref >90)
GFR calc non Af Amer: >90 mL/min (ref >90)
Glucose, Bld: 154 mg/dL — ABNORMAL HIGH (ref 70–99)
Potassium: 3 mEq/L — ABNORMAL LOW (ref 3.5–5.1)
Sodium: 139 mEq/L (ref 135–145)
Total Bilirubin: 0.4 mg/dL (ref 0.3–1.2)
Total Protein: 7.1 g/dL (ref 6.0–8.3)

## 2011-01-28 LAB — DIFFERENTIAL
Basophils Absolute: 0 10*3/uL (ref 0.0–0.1)
Basophils Relative: 0 % (ref 0–1)
Eosinophils Absolute: 0.2 10*3/uL (ref 0.0–0.7)
Eosinophils Relative: 2 % (ref 0–5)
Lymphocytes Relative: 27 % (ref 12–46)
Lymphs Abs: 2 10*3/uL (ref 0.7–4.0)
Monocytes Absolute: 0.4 10*3/uL (ref 0.1–1.0)
Monocytes Relative: 6 % (ref 3–12)
Neutro Abs: 4.7 10*3/uL (ref 1.7–7.7)
Neutrophils Relative %: 65 % (ref 43–77)

## 2011-01-28 LAB — CK TOTAL AND CKMB (NOT AT ARMC)
CK, MB: 3.4 ng/mL (ref 0.3–4.0)
Relative Index: 0.8 (ref 0.0–2.5)
Total CK: 426 U/L — ABNORMAL HIGH (ref 7–177)

## 2011-01-28 LAB — CBC
HCT: 35.5 % — ABNORMAL LOW (ref 36.0–46.0)
Hemoglobin: 12 g/dL (ref 12.0–15.0)
MCH: 29 pg (ref 26.0–34.0)
MCHC: 33.8 g/dL (ref 30.0–36.0)
MCV: 85.7 fL (ref 78.0–100.0)
Platelets: 225 10*3/uL (ref 150–400)
RBC: 4.14 MIL/uL (ref 3.87–5.11)
RDW: 13.4 % (ref 11.5–15.5)
WBC: 7.2 10*3/uL (ref 4.0–10.5)

## 2011-01-28 LAB — TROPONIN I: Troponin I: <0.3 ng/mL (ref <0.30)

## 2011-01-28 MED ORDER — PREDNISONE 10 MG PO TABS: 20.0000 mg | ORAL_TABLET | Freq: Two times a day (BID) | ORAL | Status: AC

## 2011-01-28 MED ORDER — ALBUTEROL SULFATE (5 MG/ML) 0.5% IN NEBU
2.5000 mg | INHALATION_SOLUTION | Freq: Once | RESPIRATORY_TRACT | Status: AC
  Administered 2011-01-28: 2.5 mg via RESPIRATORY_TRACT
  Filled 2011-01-28: qty 0.5

## 2011-01-28 MED ORDER — METHYLPREDNISOLONE SODIUM SUCC 125 MG IJ SOLR: 125.0000 mg | Freq: Once | INTRAMUSCULAR | Status: DC

## 2011-01-28 MED ORDER — METHYLPREDNISOLONE SODIUM SUCC 125 MG IJ SOLR
INTRAMUSCULAR | Status: AC
  Administered 2011-01-28: 18:00:00
  Filled 2011-01-28: qty 2

## 2011-01-28 MED ORDER — IPRATROPIUM BROMIDE 0.02 % IN SOLN
0.5000 mg | Freq: Once | RESPIRATORY_TRACT | Status: AC
  Administered 2011-01-28: 0.5 mg via RESPIRATORY_TRACT
  Filled 2011-01-28: qty 2.5

## 2011-01-28 NOTE — ED Notes (Signed)
EMS PIV removed at time of discharge

## 2011-01-28 NOTE — ED Notes (Signed)
MD at bedside. EDP Delo in to assess pt at time of arrival.

## 2011-01-28 NOTE — ED Provider Notes (Signed)
History     CSN: [id number] Arrival date & time: 01/28/2011  5:26 PM  Chief Complaint  Patient presents with  . Shortness of Breath    (Consider location/radiation/quality/duration/timing/severity/associated sxs/prior treatment) HPI Comments: Was at wedding, became suddenly short of breath, coughing.  Has history of asthma.  Did have some relief with neb treatment in ambulance.    Patient is a 50 y.o. female presenting with shortness of breath. The history is provided by the patient.  Shortness of Breath  The current episode started today. The problem occurs occasionally. The problem has been unchanged. The problem is moderate. The symptoms are relieved by nothing. The symptoms are aggravated by nothing. Associated symptoms include cough and shortness of breath. Pertinent negatives include no chest pain and no fever.    Past Medical History  Diagnosis Date  . Hypertension   . Diabetes mellitus   . Asthma   . Anginal pain   . Depression   . Anxiety   . Pulmonary embolism     Past Surgical History  Procedure Date  . Abdominal hysterectomy   . Leg surgery     No family history on file.  History  Substance Use Topics  . Smoking status: Former Games developer  . Smokeless tobacco: Not on file  . Alcohol Use: No    OB History    Grav Para Term Preterm Abortions TAB SAB Ect Mult Living                  Review of Systems  Constitutional: Negative for fever and chills.  Respiratory: Positive for cough and shortness of breath.   Cardiovascular: Negative for chest pain.  All other systems reviewed and are negative.    Allergies  Penicillins and Sulfonamide derivatives  Home Medications   Current Outpatient Rx  Name Route Sig Dispense Refill  . AMLODIPINE BESYLATE 10 MG PO TABS Oral Take 10 mg by mouth daily.      . ASPIRIN 81 MG PO TABS Oral Take 81 mg by mouth daily.      . ATORVASTATIN CALCIUM 20 MG PO TABS Oral Take 20 mg by mouth daily.      . CYCLOBENZAPRINE HCL  10 MG PO TABS Oral Take 10 mg by mouth daily.      [name] DIAZEPAM 5 MG PO TABS Oral Take 5 mg by mouth at bedtime as needed. For anxiety     . ISOSORBIDE DINITRATE 20 MG PO TABS Oral Take 10 mg by mouth 2 (two) times daily.      [name] LISINOPRIL-HYDROCHLOROTHIAZIDE 20-12.5 MG PO TABS Oral Take 1 tablet by mouth daily.      [name] METFORMIN HCL 500 MG PO TABS Oral Take 500 mg by mouth 2 (two) times daily.      . MULTI-VITAMIN/MINERALS PO TABS Oral Take 1 tablet by mouth daily.      [name] NABUMETONE 500 MG PO TABS Oral Take 500 mg by mouth 2 (two) times daily.      . SERTRALINE HCL 100 MG PO TABS Oral Take 100 mg by mouth daily.      . TRAMADOL HCL 50 MG PO TABS Oral Take 50 mg by mouth every 4 (four) hours as needed. For pain     . ALBUTEROL 90 MCG/ACT IN AERS Inhalation Inhale 2 puffs into the lungs 3 (three) times daily as needed. For shortness of breath       BP 129/66  Pulse 91  Temp(Src) 98 F (36.7 C) (Oral)  Resp 20  Ht 4\' 11"  (1.499 m)  Wt 230 lb (104.327 kg)  BMI 46.45 kg/m2  SpO2 100%  Physical Exam  Nursing note and vitals reviewed. Constitutional: She is oriented to person, place, and time. She appears well-developed and well-nourished. No distress.  HENT:  Head: Normocephalic and atraumatic.  Mouth/Throat: Oropharynx is clear and moist.  Neck: Normal range of motion.  Cardiovascular: Normal rate and regular rhythm.  Exam reveals no gallop and no friction rub.   No murmur heard. Pulmonary/Chest: Effort normal. She has wheezes. She has no rales.  Abdominal: Soft. She exhibits no distension. There is no tenderness.  Musculoskeletal: Normal range of motion.  Neurological: She is alert and oriented to person, place, and time.  Skin: Skin is warm and dry. She is not diaphoretic.  Psychiatric: She has a normal mood and affect.    ED Course  Procedures (including critical care time)  Labs Reviewed  CBC - Abnormal; Notable for the following:    HCT 35.5 (*)    All other components  within normal limits  COMPREHENSIVE METABOLIC PANEL - Abnormal; Notable for the following:    Potassium 3.0 (*)    Glucose, Bld 154 (*)    All other components within normal limits  CK TOTAL AND CKMB - Abnormal; Notable for the following:    Total CK 426 (*)    All other components within normal limits  DIFFERENTIAL  TROPONIN I   No results found.   No diagnosis found.    MDM  Sats okay,  Feeling better with nebs and steriods.  Will discharge to home, return prn.        Geoffery Lyons, MD 01/28/11 2005

## 2011-01-28 NOTE — ED Notes (Signed)
EMS transport from wedding reception- c/o SOB with wheezing- albuterol 5mg  and 125mg  solumedrol given pta by ems- piv left hand 20g started by ems- also c/o chest pain worse with inspiration

## 2011-01-28 NOTE — ED Notes (Signed)
Pharm tech at bedside 

## 2011-01-28 NOTE — ED Notes (Signed)
RT at bedside.

## 2011-01-30 LAB — CBC
HCT: 35.8 — ABNORMAL LOW
HCT: 36.8
HCT: 36.8
Hemoglobin: 12.1
Hemoglobin: 12.4
Hemoglobin: 12.8
MCHC: 33.8
MCHC: 33.9
MCHC: 34.8
MCV: 85.6
MCV: 87.4
MCV: 87.9
Platelets: 228
Platelets: 232
Platelets: 249
RBC: 4.09
RBC: 4.18
RBC: 4.3
RDW: 12.8
RDW: 13.3
RDW: 13.6
WBC: 6.7
WBC: 6.7
WBC: 7.3

## 2011-01-30 LAB — HEPARIN LEVEL (UNFRACTIONATED)
Heparin Unfractionated: 0.5
Heparin Unfractionated: 0.6
Heparin Unfractionated: 0.62

## 2011-01-30 LAB — COMPREHENSIVE METABOLIC PANEL
ALT: 34
ALT: 43 — ABNORMAL HIGH
ALT: 47 — ABNORMAL HIGH
AST: 19
AST: 25
AST: 26
Albumin: 3.3 — ABNORMAL LOW
Albumin: 3.3 — ABNORMAL LOW
Albumin: 3.4 — ABNORMAL LOW
Alkaline Phosphatase: 46
Alkaline Phosphatase: 48
Alkaline Phosphatase: 48
BUN: 3 — ABNORMAL LOW
BUN: 7
BUN: 7
CO2: 27
CO2: 28
CO2: 29
Calcium: 8.7
Calcium: 8.7
Calcium: 8.8
Chloride: 103
Chloride: 103
Chloride: 105
Creatinine, Ser: 0.75
Creatinine, Ser: 0.78
Creatinine, Ser: 0.82
GFR calc Af Amer: >60
GFR calc Af Amer: >60
GFR calc Af Amer: >60
GFR calc non Af Amer: >60
GFR calc non Af Amer: >60
GFR calc non Af Amer: >60
Glucose, Bld: 102 — ABNORMAL HIGH
Glucose, Bld: 103 — ABNORMAL HIGH
Glucose, Bld: 104 — ABNORMAL HIGH
Potassium: 3.2 — ABNORMAL LOW
Potassium: 3.5
Potassium: 3.6
Sodium: 138
Sodium: 141
Sodium: 141
Total Bilirubin: 0.5
Total Bilirubin: 0.8
Total Bilirubin: 0.8
Total Protein: 6.1
Total Protein: 6.3
Total Protein: 6.3

## 2011-01-30 LAB — I-STAT 8, (EC8 V) (CONVERTED LAB)
Acid-Base Excess: 2
BUN: 10
Bicarbonate: 28.8 — ABNORMAL HIGH
Chloride: 105
Glucose, Bld: 91
HCT: 40
Hemoglobin: 13.6
Operator id: 257131
Potassium: 3.3 — ABNORMAL LOW
Sodium: 140
TCO2: 30
pCO2, Ven: 53.3 — ABNORMAL HIGH
pH, Ven: 7.341 — ABNORMAL HIGH

## 2011-01-30 LAB — PROTIME-INR
INR: 1.1
Prothrombin Time: 14.5

## 2011-01-30 LAB — B-NATRIURETIC PEPTIDE (CONVERTED LAB)
Pro B Natriuretic peptide (BNP): 31
Pro B Natriuretic peptide (BNP): <32

## 2011-01-30 LAB — POCT CARDIAC MARKERS
CKMB, poc: 1.5
Myoglobin, poc: 70.9
Operator id: 257131
Troponin i, poc: <0.05

## 2011-01-30 LAB — CARDIAC PANEL(CRET KIN+CKTOT+MB+TROPI)
CK, MB: 1.9
CK, MB: 2
CK, MB: 2.1
CK, MB: 2.2
Relative Index: 1.3
Relative Index: 1.3
Relative Index: 1.4
Relative Index: 1.4
Total CK: 148
Total CK: 149
Total CK: 152
Total CK: 161
Troponin I: 0.08 — ABNORMAL HIGH
Troponin I: 0.09 — ABNORMAL HIGH
Troponin I: 0.09 — ABNORMAL HIGH
Troponin I: 0.1 — ABNORMAL HIGH

## 2011-01-30 LAB — POCT I-STAT CREATININE
Creatinine, Ser: 0.9
Operator id: 257131

## 2011-01-30 LAB — CK TOTAL AND CKMB (NOT AT ARMC)
CK, MB: 2.6
Relative Index: 1.4
Total CK: 191 — ABNORMAL HIGH

## 2011-01-30 LAB — LIPID PANEL
Cholesterol: 105
HDL: 36 — ABNORMAL LOW
LDL Cholesterol: 55
Total CHOL/HDL Ratio: 2.9
Triglycerides: 69
VLDL: 14

## 2011-01-30 LAB — TSH: TSH: 0.559

## 2011-01-30 LAB — TROPONIN I: Troponin I: 0.09 — ABNORMAL HIGH

## 2011-01-30 LAB — PHOSPHORUS
Phosphorus: 3.6
Phosphorus: 3.9

## 2011-01-30 LAB — MAGNESIUM
Magnesium: 1.9
Magnesium: 2

## 2011-02-02 LAB — CBC
HCT: 39.3
Hemoglobin: 13.6
MCHC: 34.5
MCV: 86
Platelets: 236
RBC: 4.57
RDW: 12.9
WBC: 6.8

## 2011-02-02 LAB — DIFFERENTIAL
Basophils Absolute: 0
Basophils Relative: 1
Eosinophils Absolute: 0.4
Eosinophils Relative: 6 — ABNORMAL HIGH
Lymphocytes Relative: 52 — ABNORMAL HIGH
Lymphs Abs: 3.6 — ABNORMAL HIGH
Monocytes Absolute: 0.4
Monocytes Relative: 6
Neutro Abs: 2.4
Neutrophils Relative %: 35 — ABNORMAL LOW

## 2011-02-02 LAB — D-DIMER, QUANTITATIVE: D-Dimer, Quant: 0.78 — ABNORMAL HIGH

## 2011-02-02 LAB — POCT CARDIAC MARKERS
CKMB, poc: 1.8
Myoglobin, poc: 59.6
Operator id: 294501
Troponin i, poc: <0.05

## 2011-02-07 LAB — COMPREHENSIVE METABOLIC PANEL
ALT: 32
AST: 22
Albumin: 3.5
Alkaline Phosphatase: 54
BUN: 8
CO2: 30
Calcium: 8.9
Chloride: 99
Creatinine, Ser: 0.74
GFR calc Af Amer: >60
GFR calc non Af Amer: >60
Glucose, Bld: 91
Potassium: 3.3 — ABNORMAL LOW
Sodium: 135
Total Bilirubin: 0.7
Total Protein: 7

## 2011-02-07 LAB — DIFFERENTIAL
Basophils Absolute: 0
Basophils Relative: 1
Eosinophils Absolute: 0.4
Eosinophils Relative: 6 — ABNORMAL HIGH
Lymphocytes Relative: 33
Lymphs Abs: 2.4
Monocytes Absolute: 0.4
Monocytes Relative: 6
Neutro Abs: 4
Neutrophils Relative %: 55

## 2011-02-07 LAB — CBC
HCT: 39.7
Hemoglobin: 13.3
MCHC: 33.5
MCV: 87.4
Platelets: 267
RBC: 4.54
RDW: 13
WBC: 7.3

## 2011-02-07 LAB — B-NATRIURETIC PEPTIDE (CONVERTED LAB): Pro B Natriuretic peptide (BNP): <30

## 2011-02-07 LAB — POCT CARDIAC MARKERS
CKMB, poc: 2.1
Myoglobin, poc: 97.6
Operator id: 291361
Troponin i, poc: <0.05

## 2011-02-07 LAB — D-DIMER, QUANTITATIVE (NOT AT ARMC): D-Dimer, Quant: 0.43

## 2011-02-08 LAB — CBC
HCT: 34.9 — ABNORMAL LOW
HCT: 40
Hemoglobin: 11.9 — ABNORMAL LOW
Hemoglobin: 13.5
MCHC: 33.7
MCHC: 34.2
MCV: 85.5
MCV: 87.3
Platelets: 235
Platelets: 251
RBC: 4.09
RBC: 4.59
RDW: 13.4
RDW: 13.8
WBC: 6.8
WBC: 7.1

## 2011-02-08 LAB — BASIC METABOLIC PANEL
BUN: 9
BUN: 9
CO2: 26
CO2: 28
Calcium: 8.9
Calcium: 9.2
Chloride: 103
Chloride: 99
Creatinine, Ser: 0.69
Creatinine, Ser: 0.74
GFR calc Af Amer: >60
GFR calc Af Amer: >60
GFR calc non Af Amer: >60
GFR calc non Af Amer: >60
Glucose, Bld: 87
Glucose, Bld: 92
Potassium: 3.4 — ABNORMAL LOW
Potassium: 3.5
Sodium: 134 — ABNORMAL LOW
Sodium: 139

## 2011-02-08 LAB — DIFFERENTIAL
Basophils Absolute: 0
Basophils Relative: 1
Eosinophils Absolute: 0.3
Eosinophils Relative: 5
Lymphocytes Relative: 39
Lymphs Abs: 2.8
Monocytes Absolute: 0.4
Monocytes Relative: 6
Neutro Abs: 3.6
Neutrophils Relative %: 50

## 2011-02-08 LAB — CK TOTAL AND CKMB (NOT AT ARMC)
CK, MB: 1.9
Relative Index: 1.4
Total CK: 140

## 2011-02-08 LAB — PROTIME-INR
INR: 0.9
Prothrombin Time: 12.7

## 2011-02-08 LAB — CARDIAC PANEL(CRET KIN+CKTOT+MB+TROPI)
CK, MB: 1.4
CK, MB: 1.4
CK, MB: 1.5
CK, MB: 1.6
CK, MB: 1.7
Relative Index: 1.2
Relative Index: 1.2
Relative Index: 1.2
Relative Index: 1.2
Relative Index: 1.3
Total CK: 110
Total CK: 115
Total CK: 126
Total CK: 131
Total CK: 139
Troponin I: 0.08 — ABNORMAL HIGH
Troponin I: 0.09 — ABNORMAL HIGH
Troponin I: 0.1 — ABNORMAL HIGH
Troponin I: 0.1 — ABNORMAL HIGH
Troponin I: 0.1 — ABNORMAL HIGH

## 2011-02-08 LAB — D-DIMER, QUANTITATIVE (NOT AT ARMC): D-Dimer, Quant: 0.57 — ABNORMAL HIGH

## 2011-02-08 LAB — TROPONIN I: Troponin I: 0.1 — ABNORMAL HIGH

## 2011-02-08 LAB — B-NATRIURETIC PEPTIDE (CONVERTED LAB): Pro B Natriuretic peptide (BNP): <30

## 2011-03-13 IMAGING — CR DG CHEST 2V
2 series · 2 of 2 positions shown · non-contrast
Comparison: 03/21/2009

CLINICAL DATA: Hemoptysis.

CHEST - 2 VIEW

[w chest pa]
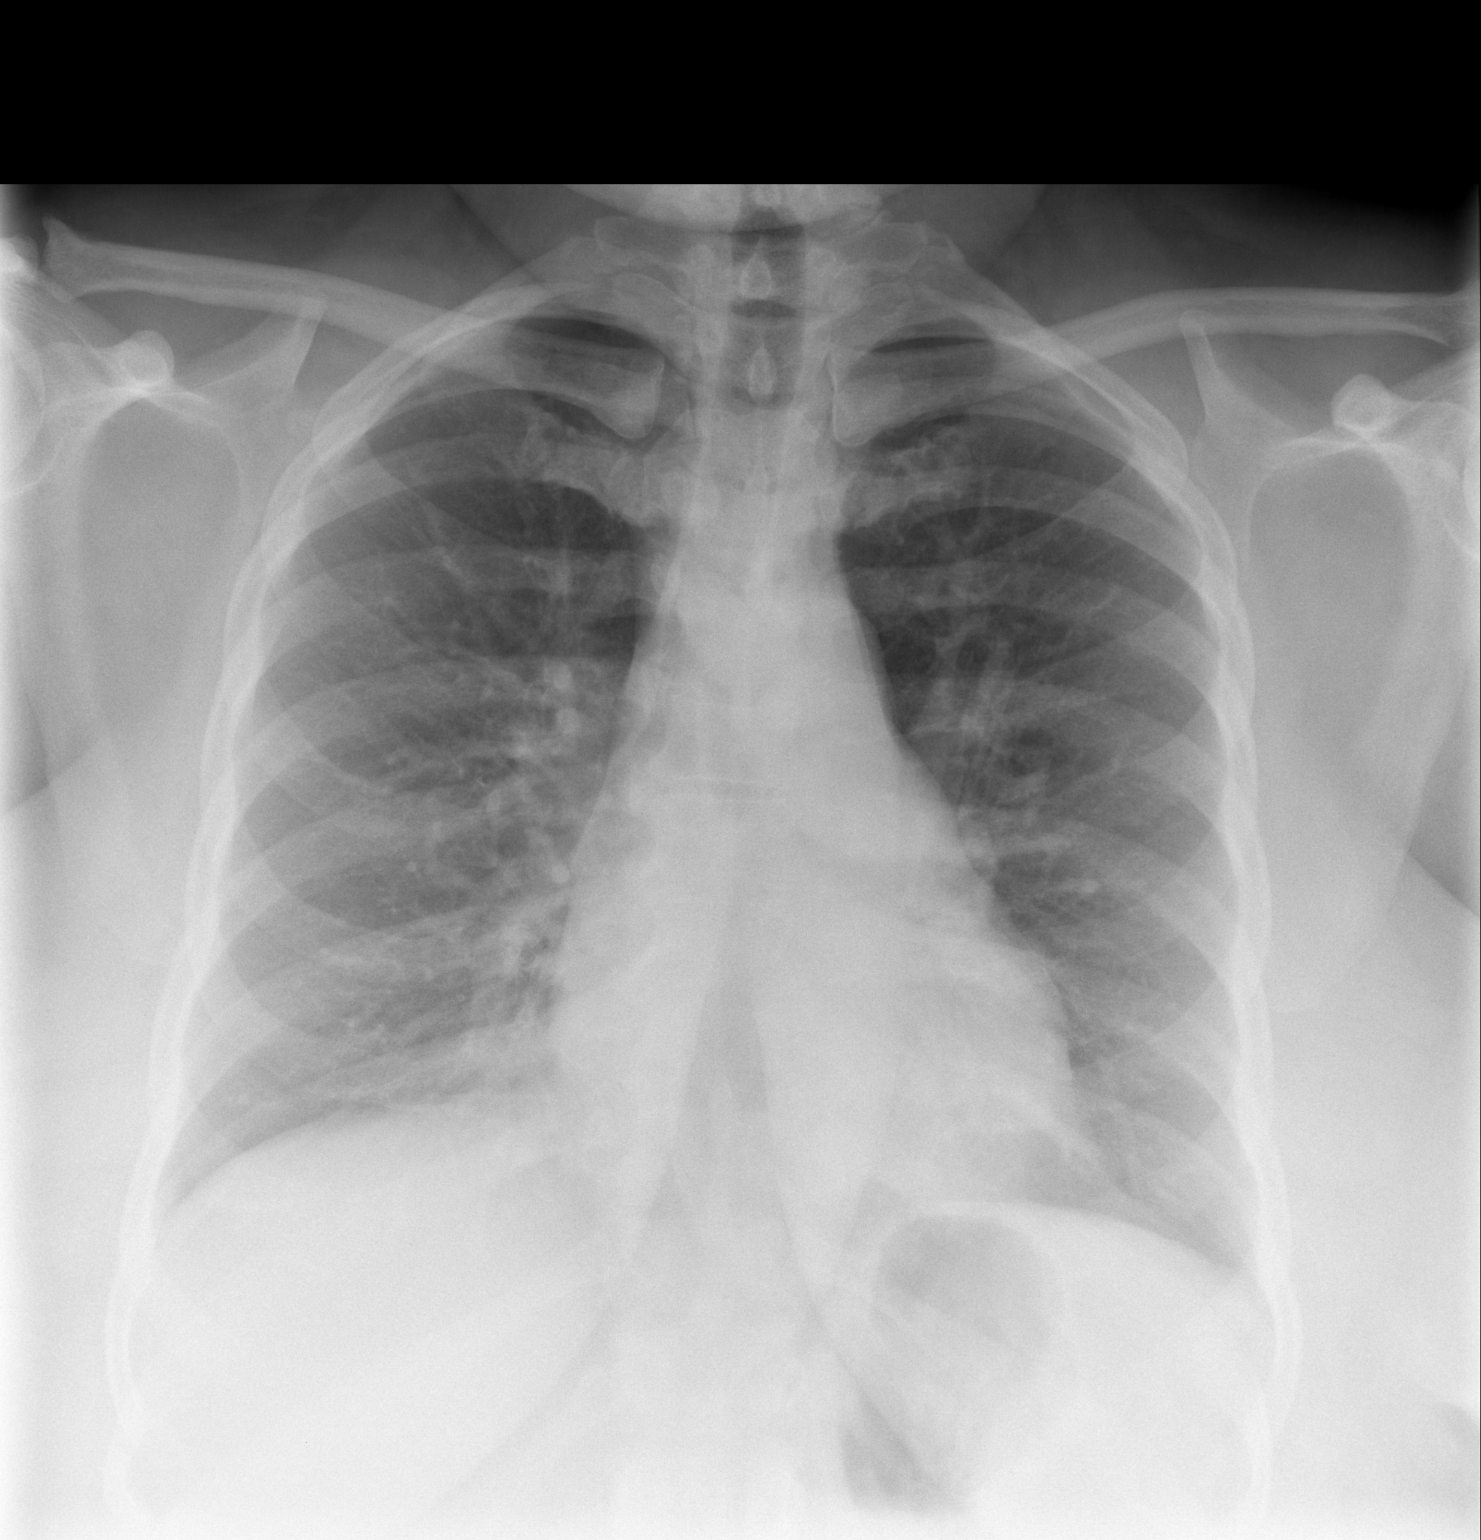

[w chest lat]
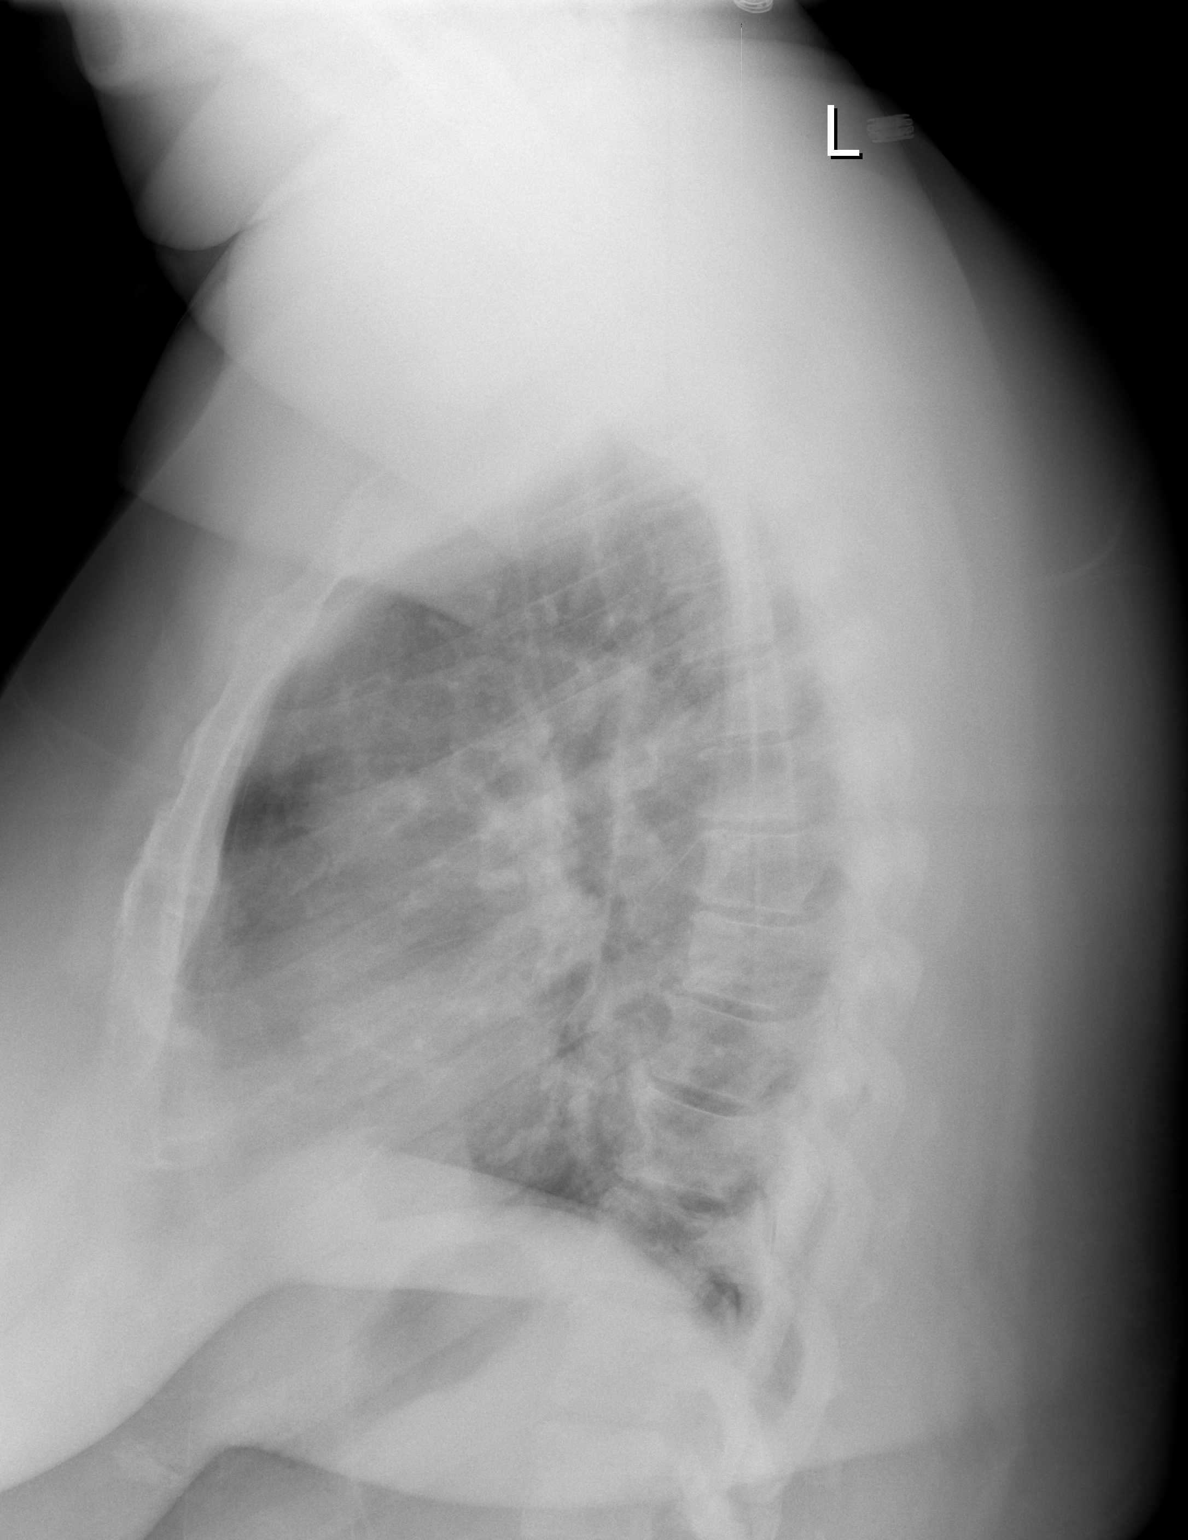

[2 of 2 positions shown; findings below may reference images not displayed]

FINDINGS: Heart size is enlarged.

There is no pleural effusion or pulmonary edema.

No airspace consolidation identified.

There is mild multilevel spondylosis within the thoracic spine.
IMPRESSION: 1.  No findings to explain hemoptysis.

## 2011-03-13 IMAGING — CT CT ANGIO CHEST
2 of 6 series · 19 of 36 positions shown · IV contrast (APPLIED)
Comparison: 03/21/2009

CLINICAL DATA: Evaluate for pulmonary embolus.  Chest pain.  Chest
pressure.  Shortness of breath.

CT ANGIOGRAPHY CHEST WITH CONTRAST
TECHNIQUE: Multidetector CT imaging of the chest was performed
using the standard protocol during bolus administration of
intravenous contrast.  Multiplanar CT image reconstructions
including MIPs were obtained to evaluate the vascular anatomy.
Contrast:  100 ml of omni 300

[Series 11: pulm embolism 1.0 b25f thins · axial · 0.65mm/px · z∈[-156,+78]mm · 18 of 262 slices shown]
[im 14/262  lung]
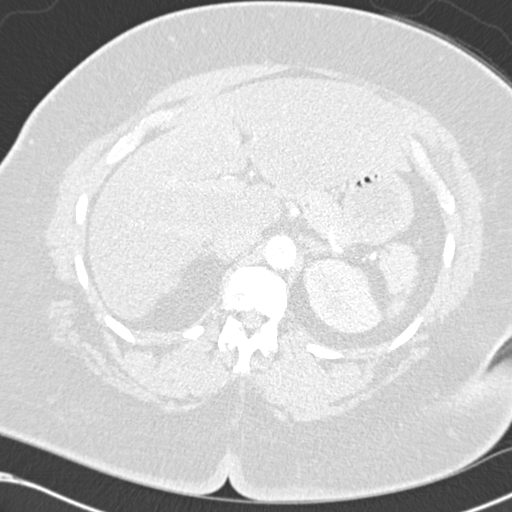
[im 27/262  mediastinal]
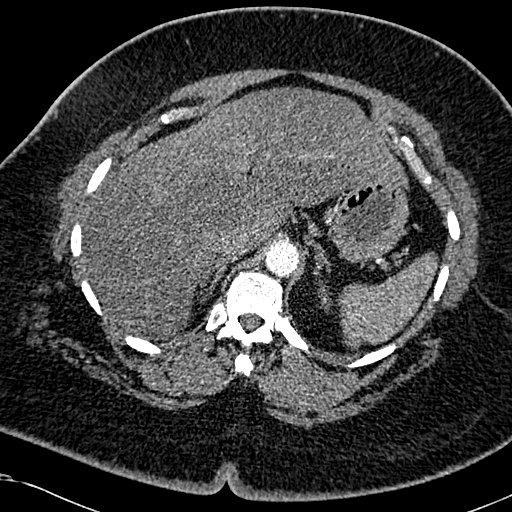
[im 40/262  lung]
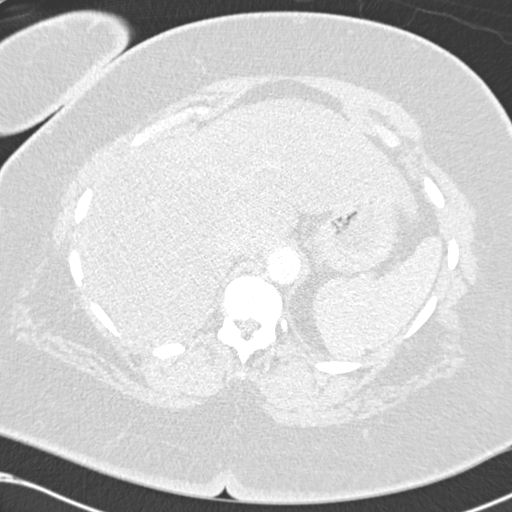
[im 53/262  mediastinal]
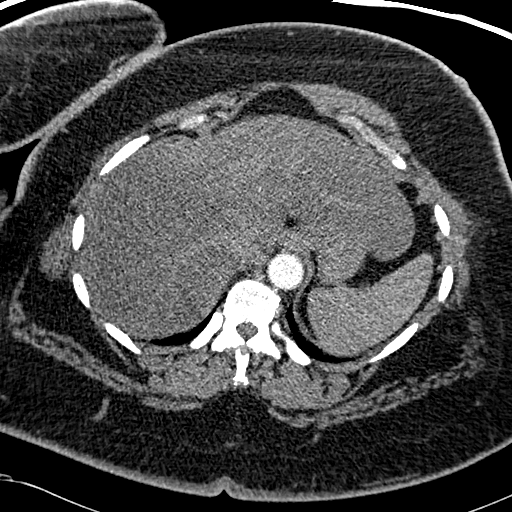
[im 66/262  lung]
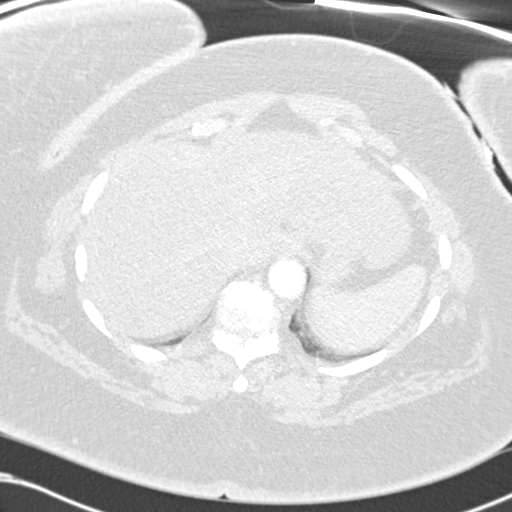
[im 79/262  mediastinal]
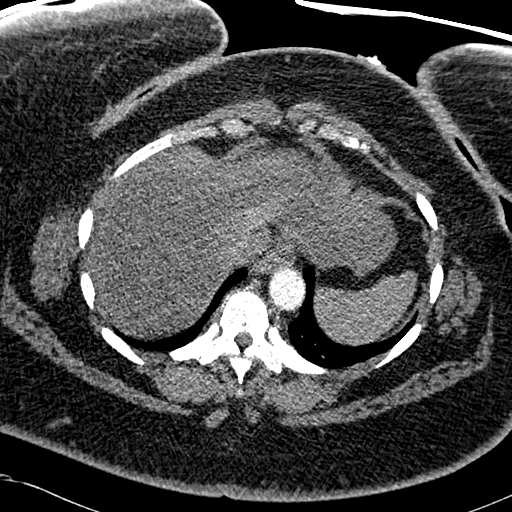
[im 92/262  lung]
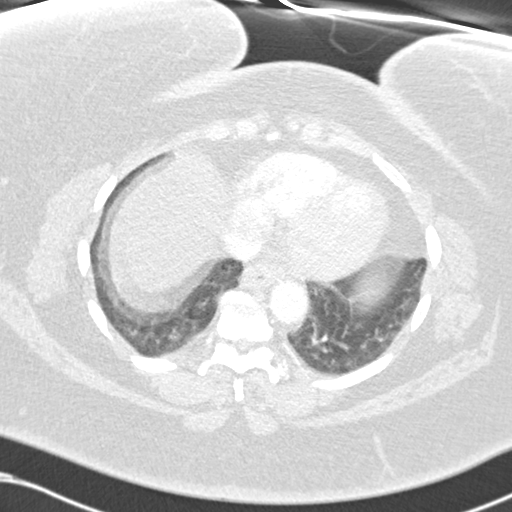
[im 105/262  mediastinal]
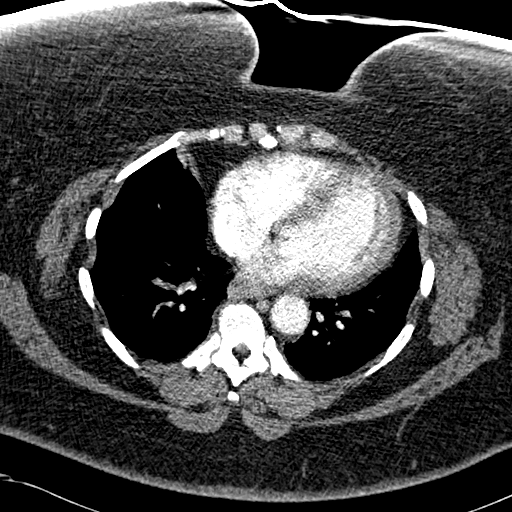
[im 118/262  lung]
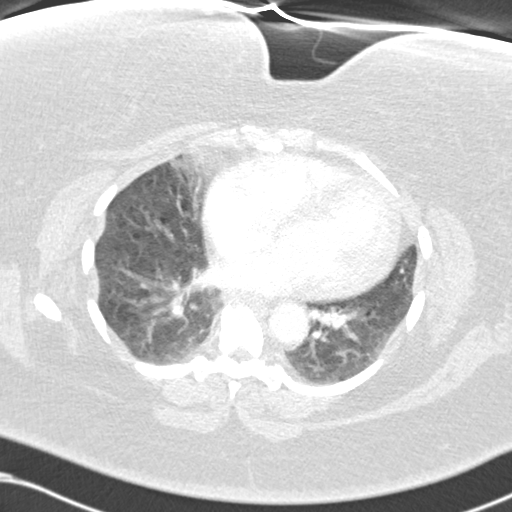
[im 144/262  mediastinal]
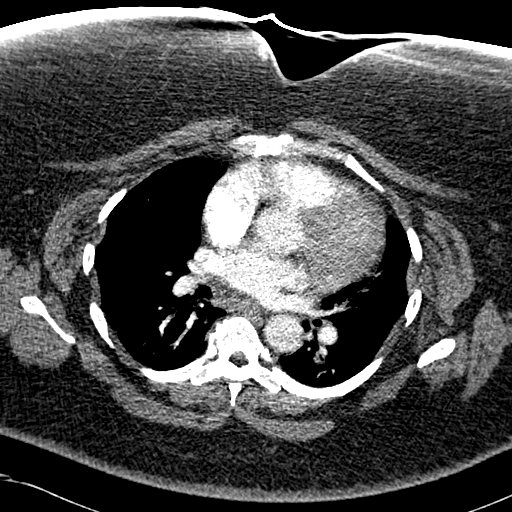
[im 157/262  lung]
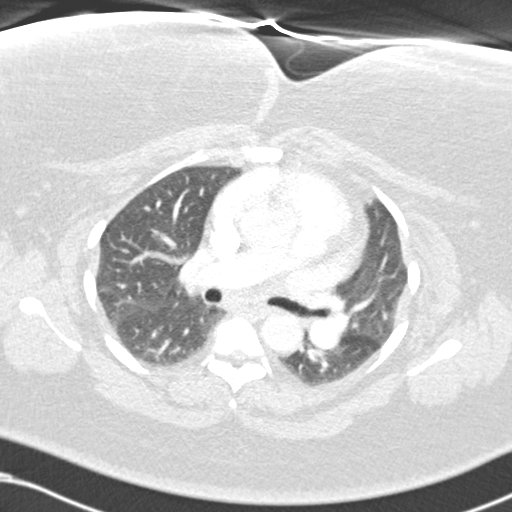
[im 170/262  mediastinal]
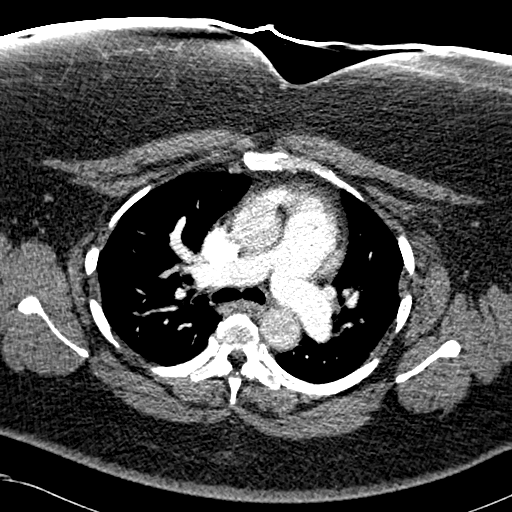
[im 183/262  lung]
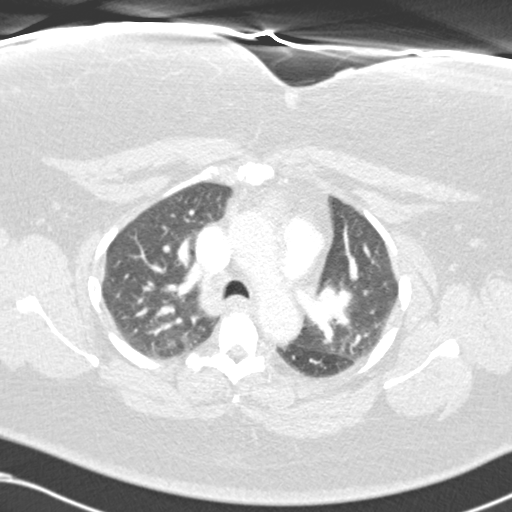
[im 196/262  mediastinal]
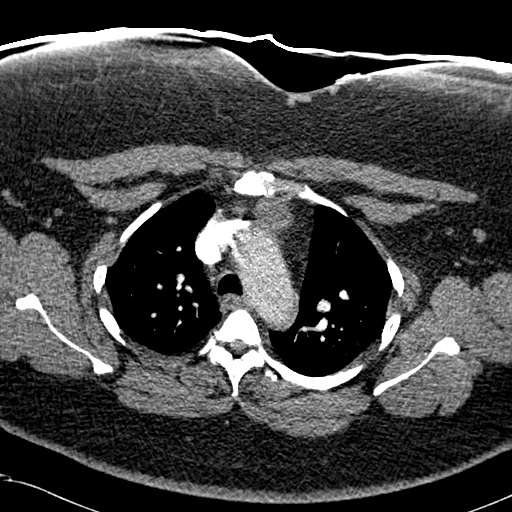
[im 209/262  lung]
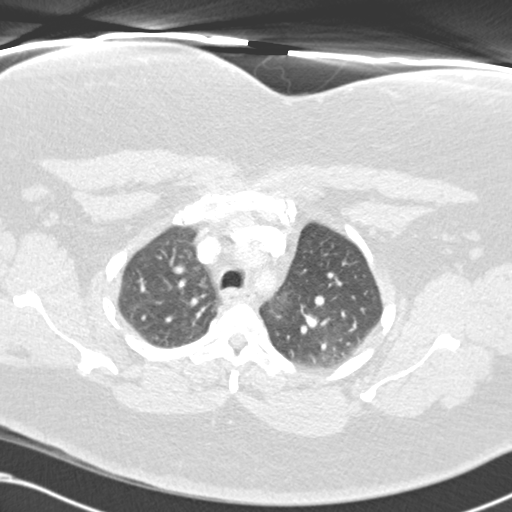
[im 222/262  mediastinal]
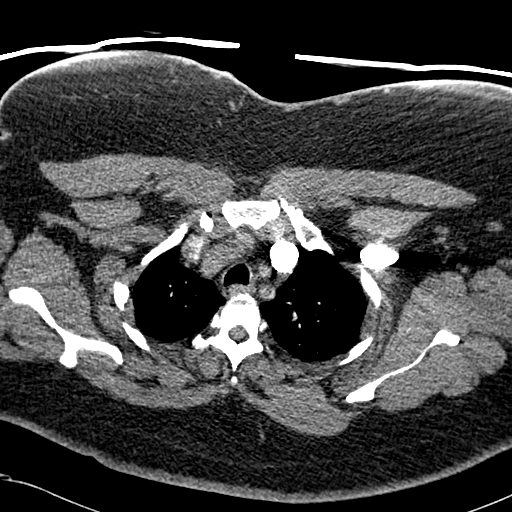
[im 235/262  lung]
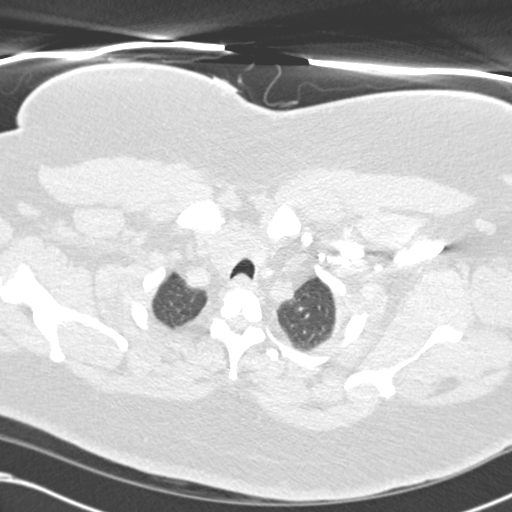
[im 248/262  mediastinal]
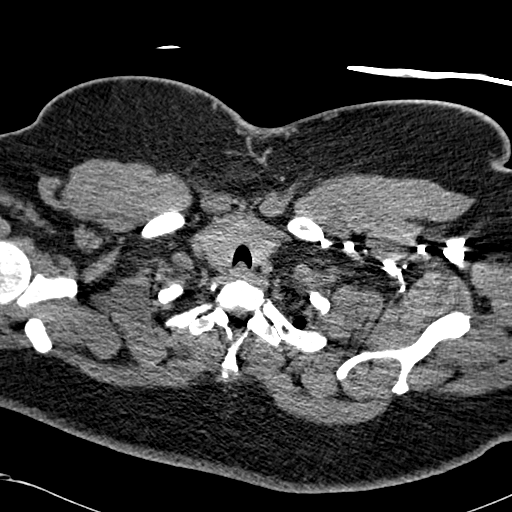

[Series 602: <mpr thick range> · coronal · 0.65mm/px · 1 of 111 slices shown]
[im 56/111  mediastinal]
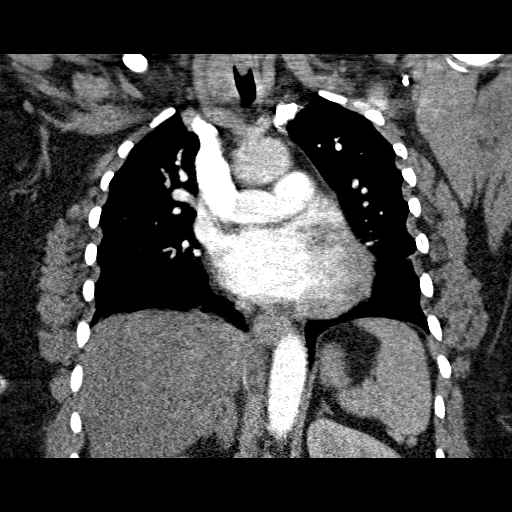

[19 of 36 positions shown; findings below may reference images not displayed]

FINDINGS: There are no enlarged axillary or supraclavicular lymph
nodes.

Soft tissue nodules within the anterior mediastinum are again
noted.  The largest measuring approximately 5.5 cm, image 23.  This
is unchanged from prior exam.

No hilar adenopathy.

No pericardial or pleural effusions.

Lower lobe pulmonary arteries are obscured by respiratory motion
artifact.

Within this limitation no abnormal filling defects are identified
to suggest acute pulmonary embolus.

Review of the visualized osseous structures shows thoracic
spondylosis.

Limited imaging through the upper abdomen shows fatty infiltration
of the liver.

Review of the MIP images confirms the above findings.
IMPRESSION: 1.  No evidence for acute pulmonary embolus.
2.  Lower lobe pulmonary arteries obscured by respiratory motion
artifact.
3.  Stable soft tissue nodules/masses in the prevascular anterior
mediastinum

## 2011-03-15 ENCOUNTER — Other Ambulatory Visit: Payer: Self-pay

## 2011-03-15 ENCOUNTER — Emergency Department (HOSPITAL_COMMUNITY): Payer: Medicare Other

## 2011-03-15 ENCOUNTER — Emergency Department (HOSPITAL_COMMUNITY)
Admission: EM | Admit: 2011-03-15 | Discharge: 2011-03-15 | Disposition: A | Discharge status: Home / Self Care | Payer: Medicare Other | Attending: Emergency Medicine | Admitting: Emergency Medicine

## 2011-03-15 ENCOUNTER — Encounter (HOSPITAL_COMMUNITY): Payer: Self-pay | Admitting: *Deleted

## 2011-03-15 DIAGNOSIS — R0602 Shortness of breath: Secondary | ICD-10-CM | POA: Insufficient documentation

## 2011-03-15 DIAGNOSIS — R05 Cough: Secondary | ICD-10-CM | POA: Insufficient documentation

## 2011-03-15 DIAGNOSIS — Z86711 Personal history of pulmonary embolism: Secondary | ICD-10-CM | POA: Insufficient documentation

## 2011-03-15 DIAGNOSIS — J3489 Other specified disorders of nose and nasal sinuses: Secondary | ICD-10-CM | POA: Insufficient documentation

## 2011-03-15 DIAGNOSIS — R110 Nausea: Secondary | ICD-10-CM | POA: Insufficient documentation

## 2011-03-15 DIAGNOSIS — M25519 Pain in unspecified shoulder: Secondary | ICD-10-CM | POA: Insufficient documentation

## 2011-03-15 DIAGNOSIS — R61 Generalized hyperhidrosis: Secondary | ICD-10-CM | POA: Insufficient documentation

## 2011-03-15 DIAGNOSIS — F341 Dysthymic disorder: Secondary | ICD-10-CM | POA: Insufficient documentation

## 2011-03-15 DIAGNOSIS — J45909 Unspecified asthma, uncomplicated: Secondary | ICD-10-CM | POA: Insufficient documentation

## 2011-03-15 DIAGNOSIS — Z79899 Other long term (current) drug therapy: Secondary | ICD-10-CM | POA: Insufficient documentation

## 2011-03-15 DIAGNOSIS — E119 Type 2 diabetes mellitus without complications: Secondary | ICD-10-CM | POA: Insufficient documentation

## 2011-03-15 DIAGNOSIS — Z9889 Other specified postprocedural states: Secondary | ICD-10-CM | POA: Insufficient documentation

## 2011-03-15 DIAGNOSIS — I10 Essential (primary) hypertension: Secondary | ICD-10-CM | POA: Insufficient documentation

## 2011-03-15 DIAGNOSIS — R059 Cough, unspecified: Secondary | ICD-10-CM | POA: Insufficient documentation

## 2011-03-15 DIAGNOSIS — R072 Precordial pain: Secondary | ICD-10-CM | POA: Insufficient documentation

## 2011-03-15 DIAGNOSIS — R51 Headache: Secondary | ICD-10-CM | POA: Insufficient documentation

## 2011-03-15 LAB — CBC
HCT: 40.9 % (ref 36.0–46.0)
Hemoglobin: 13.8 g/dL (ref 12.0–15.0)
MCH: 29.1 pg (ref 26.0–34.0)
MCHC: 33.7 g/dL (ref 30.0–36.0)
MCV: 86.1 fL (ref 78.0–100.0)
Platelets: 226 10*3/uL (ref 150–400)
RBC: 4.75 MIL/uL (ref 3.87–5.11)
RDW: 13 % (ref 11.5–15.5)
WBC: 6.3 10*3/uL (ref 4.0–10.5)

## 2011-03-15 LAB — DIFFERENTIAL
Basophils Absolute: 0 10*3/uL (ref 0.0–0.1)
Basophils Relative: 1 % (ref 0–1)
Eosinophils Absolute: 0.1 10*3/uL (ref 0.0–0.7)
Eosinophils Relative: 2 % (ref 0–5)
Lymphocytes Relative: 44 % (ref 12–46)
Lymphs Abs: 2.8 10*3/uL (ref 0.7–4.0)
Monocytes Absolute: 0.4 10*3/uL (ref 0.1–1.0)
Monocytes Relative: 7 % (ref 3–12)
Neutro Abs: 2.9 10*3/uL (ref 1.7–7.7)
Neutrophils Relative %: 46 % (ref 43–77)

## 2011-03-15 LAB — POCT I-STAT, CHEM 8
BUN: 9 mg/dL (ref 6–23)
Calcium, Ion: 1.22 mmol/L (ref 1.12–1.32)
Chloride: 99 mEq/L (ref 96–112)
Creatinine, Ser: 1.1 mg/dL (ref 0.50–1.10)
Glucose, Bld: 143 mg/dL — ABNORMAL HIGH (ref 70–99)
HCT: 42 % (ref 36.0–46.0)
Hemoglobin: 14.3 g/dL (ref 12.0–15.0)
Potassium: 3.3 mEq/L — ABNORMAL LOW (ref 3.5–5.1)
Sodium: 139 mEq/L (ref 135–145)
TCO2: 32 mmol/L (ref 0–100)

## 2011-03-15 LAB — URINALYSIS, ROUTINE W REFLEX MICROSCOPIC
Bilirubin Urine: NEGATIVE
Glucose, UA: NEGATIVE mg/dL
Hgb urine dipstick: NEGATIVE
Ketones, ur: NEGATIVE mg/dL
Leukocytes, UA: NEGATIVE
Nitrite: NEGATIVE
Protein, ur: NEGATIVE mg/dL
Specific Gravity, Urine: 1.013 (ref 1.005–1.030)
Urobilinogen, UA: 0.2 mg/dL (ref 0.0–1.0)
pH: 6 (ref 5.0–8.0)

## 2011-03-15 LAB — HEPATIC FUNCTION PANEL
ALT: 47 U/L — ABNORMAL HIGH (ref 0–35)
AST: 27 U/L (ref 0–37)
Albumin: 3.8 g/dL (ref 3.5–5.2)
Alkaline Phosphatase: 71 U/L (ref 39–117)
Bilirubin, Direct: <0.1 mg/dL (ref 0.0–0.3)
Total Bilirubin: 0.4 mg/dL (ref 0.3–1.2)
Total Protein: 7.6 g/dL (ref 6.0–8.3)

## 2011-03-15 LAB — POCT I-STAT TROPONIN I
Troponin i, poc: 0.05 ng/mL (ref 0.00–0.08)
Troponin i, poc: 0.04 ng/mL (ref 0.00–0.08)

## 2011-03-15 LAB — LIPASE, BLOOD: Lipase: 38 U/L (ref 11–59)

## 2011-03-15 LAB — PRO B NATRIURETIC PEPTIDE: Pro B Natriuretic peptide (BNP): 40.6 pg/mL (ref 0–125)

## 2011-03-15 MED ORDER — ASPIRIN 81 MG PO CHEW
CHEWABLE_TABLET | ORAL | Status: AC
  Filled 2011-03-15: qty 4

## 2011-03-15 MED ORDER — IOHEXOL 300 MG/ML  SOLN: 70.0000 mL | Freq: Once | INTRAMUSCULAR | Status: DC | PRN

## 2011-03-15 MED ORDER — ASPIRIN 325 MG PO TABS
325.0000 mg | ORAL_TABLET | Freq: Once | ORAL | Status: AC
  Administered 2011-03-15: 325 mg via ORAL
  Filled 2011-03-15: qty 1

## 2011-03-15 NOTE — ED Notes (Signed)
Patient reports onset of not feeling well last night.  Her sugar has been elevated, she has had nausea and headache.  Patient states today she is having chest pain and sob.  Patient has taken her meds today.  She ate breakfast at 0830.  Patient has hx of angina

## 2011-03-15 NOTE — ED Notes (Signed)
Patients urine sample has been collected and is available for lab if needed.

## 2011-03-15 NOTE — ED Provider Notes (Signed)
History     CSN: [id number] Arrival date & time: 03/15/2011 10:42 AM   First MD Initiated Contact with Patient 03/15/11 1110      Chief Complaint  Patient presents with  . Chest Pain  . Excessive Sweating  . Headache  . Nausea    (Consider location/radiation/quality/duration/timing/severity/associated sxs/prior treatment) HPI The patient is a 50 year old diabetic female who presents today complaining of having chest pain as well as not feeling well in general. Patient describes having pain that is substernal and radiating towards her left shoulder. She says this has been going on for 2 days. She denies a burning sensation with this. She says the pain is a 10 out of 10. She's had a history of stress tests that have been negative in the past. Last of these was performed 3-4 years ago. She endorses recent mild cough and congestion. Patient also has history of pulmonary embolus. She is no longer on Coumadin for this. She is uncertain whether her chest pain feels like her prior pulmonary embolus or not. There are no other associated or modifying factors. Past Medical History  Diagnosis Date  . Hypertension   . Diabetes mellitus   . Asthma   . Anginal pain   . Depression   . Anxiety   . Pulmonary embolism     Past Surgical History  Procedure Date  . Abdominal hysterectomy   . Leg surgery     History reviewed. No pertinent family history.  History  Substance Use Topics  . Smoking status: Former Games developer  . Smokeless tobacco: Not on file  . Alcohol Use: No    OB History    Grav Para Term Preterm Abortions TAB SAB Ect Mult Living                  Review of Systems  Constitutional: Negative.   HENT: Positive for congestion.   Eyes: Negative.   Respiratory: Negative.   Cardiovascular: Positive for chest pain.  Gastrointestinal: Negative.   Genitourinary: Negative.   Musculoskeletal: Negative.   Skin: Negative.   Neurological: Negative.   Hematological: Negative.     Psychiatric/Behavioral: Negative.   All other systems reviewed and are negative.    Allergies  Penicillins and Sulfonamide derivatives  Home Medications   Current Outpatient Rx  Name Route Sig Dispense Refill  . ALBUTEROL 90 MCG/ACT IN AERS Inhalation Inhale 2 puffs into the lungs 3 (three) times daily as needed. For shortness of breath     . AMLODIPINE BESYLATE 10 MG PO TABS Oral Take 10 mg by mouth daily.      . ASPIRIN 81 MG PO TABS Oral Take 81 mg by mouth daily.      . ATORVASTATIN CALCIUM 20 MG PO TABS Oral Take 20 mg by mouth daily.      . CYCLOBENZAPRINE HCL 10 MG PO TABS Oral Take 10 mg by mouth daily.      [name] DIAZEPAM 5 MG PO TABS Oral Take 5 mg by mouth at bedtime as needed. For anxiety     . ISOSORBIDE DINITRATE 20 MG PO TABS Oral Take 10 mg by mouth 2 (two) times daily.      [name] LISINOPRIL-HYDROCHLOROTHIAZIDE 20-12.5 MG PO TABS Oral Take 1 tablet by mouth daily.      [name] METFORMIN HCL 500 MG PO TABS Oral Take 500 mg by mouth 2 (two) times daily.      . MULTI-VITAMIN/MINERALS PO TABS Oral Take 1 tablet by mouth daily.      [name]  NABUMETONE 500 MG PO TABS Oral Take 500 mg by mouth 2 (two) times daily.     . SERTRALINE HCL 100 MG PO TABS Oral Take 100 mg by mouth daily.       BP 164/93  Pulse 90  Temp(Src) 98.1 F (36.7 C) (Oral)  Resp 26  Ht 4\' 11"  (1.499 m)  Wt 230 lb (104.327 kg)  BMI 46.45 kg/m2  SpO2 97%  Physical Exam  Nursing note and vitals reviewed. Constitutional: She is oriented to person, place, and time. She appears well-developed and well-nourished. No distress.  HENT:  Head: Normocephalic and atraumatic.  Eyes: Conjunctivae and EOM are normal. Pupils are equal, round, and reactive to light.  Neck: Normal range of motion.  Cardiovascular: Normal rate, regular rhythm, normal heart sounds and intact distal pulses.  Exam reveals no gallop and no friction rub.   No murmur heard. Pulmonary/Chest: Effort normal and breath sounds normal. No respiratory distress.  She has no wheezes. She has no rales.  Abdominal: Soft. Bowel sounds are normal. She exhibits no distension. There is no tenderness. There is no rebound and no guarding.  Musculoskeletal: Normal range of motion. She exhibits no edema and no tenderness.  Neurological: She is alert and oriented to person, place, and time. No cranial nerve deficit. She exhibits normal muscle tone. Coordination normal.  Skin: Skin is warm and dry. No rash noted.  Psychiatric: She has a normal mood and affect.    ED Course  Procedures (including critical care time)  Date: 03/15/2011  Rate: 86  Rhythm: normal sinus rhythm  QRS Axis: normal  Intervals: normal  ST/T Wave abnormalities: nonspecific ST/T changes  Conduction Disutrbances:none  Narrative Interpretation:   Old EKG Reviewed: unchanged   Labs Reviewed  HEPATIC FUNCTION PANEL - Abnormal; Notable for the following:    ALT 47 (*)    All other components within normal limits  POCT I-STAT, CHEM 8 - Abnormal; Notable for the following:    Potassium 3.3 (*)    Glucose, Bld 143 (*)    All other components within normal limits  CBC  DIFFERENTIAL  LIPASE, BLOOD  URINALYSIS, ROUTINE W REFLEX MICROSCOPIC  PRO B NATRIURETIC PEPTIDE  POCT I-STAT TROPONIN I  POCT I-STAT TROPONIN I   Dg Chest 2 View  03/15/2011  *RADIOLOGY REPORT*  Clinical Data: Chest pain and shortness of breath.  CHEST - 2 VIEW  Comparison: 01/28/2011  Findings: Two views of the chest demonstrate slightly prominent central vascular markings and prominent basilar lung markings. Heart size is within normal limits.  There is no focal airspace disease.  Bony structures appear intact.  IMPRESSION: Slightly prominent vascular markings, particularly at the lung bases.  There is no evidence for frank pulmonary edema or airspace disease.  Original Report Authenticated By: Richarda Overlie, M.D.   Ct Angio Chest W/cm &/or Wo Cm  03/15/2011  *RADIOLOGY REPORT*  Clinical Data:  Chest pain and evaluate  for pulmonary embolism.  CT ANGIOGRAPHY CHEST WITH CONTRAST  Technique:  Multidetector CT imaging of the chest was performed using the standard protocol during bolus administration of intravenous contrast.  Multiplanar CT image reconstructions including MIPs were obtained to evaluate the vascular anatomy.  Contrast:  70 ml Omnipaque-300  Comparison:  CT 01/14/2011  Findings:  No evidence for pulmonary embolism.  The thoracic aorta and great vessels are patent.  No evidence of pericardial or pleural fluid.  Low density in the liver could represent hepatic steatosis.  Again noted is a  round low density structure in the anterior mediastinum that roughly measures 2.5 x 2.6 cm.  Structure measures 2.6 cm in the craniocaudal dimension which is unchanged. This lesion appears to be stable.  There is an adjacent hyperdense nodule posterior to the sternum.  The structure measures 1.5 cm in the AP dimension. Again seen is a fullness of the thyroid tissue.  The trachea and mainstem bronchi are patent.  Lungs are clear bilaterally. No acute bony abnormality.  Review of the MIP images confirms the above findings.  IMPRESSION: No evidence for a pulmonary embolism.  No acute chest findings.  Minimal change in the anterior mediastinal nodules.  These nodules have been present since 2006 and minimally enlarged in the interim.  Probable hepatic steatosis.  Original Report Authenticated By: Richarda Overlie, M.D.     1. Chest pain       MDM   Patient was admitted and evaluated by myself. Given her history she was worked up for ACS as well as a pulmonary embolus. Patient had normal troponins x2 as well as an unremarkable CBC, renal panel, and chest x-ray. EKG was unchanged from previous. Patient also had a history of pulmonary embolus and a CT angiogram performed. This returned negative as well. Patient did intermittently complain of nausea and had a hepatic function panel as well as a lipase which were unremarkable. A urinalysis  was also completely bland as well. Patient did receive aspirin while awaiting completion of her workup. She remained hemodynamically stable in the emergency department. She felt better following testing. We considered admission to [facility] for a low-risk chest pain protocol. However, given the Thanksgiving holiday tomorrow stress testing would not be available. Patient will be able to followup through her primary care physician would help serve. I recommended that she contact them regarding stress testing in the future. Patient was comfortable with plan and was discharged home in good condition.     Cyndra Numbers, MD 03/16/11 [phone]

## 2011-03-20 ENCOUNTER — Emergency Department (HOSPITAL_COMMUNITY)
Admission: EM | Admit: 2011-03-20 | Discharge: 2011-03-21 | Disposition: A | Discharge status: Home / Self Care | Payer: Medicare Other | Attending: Emergency Medicine | Admitting: Emergency Medicine

## 2011-03-20 ENCOUNTER — Other Ambulatory Visit: Payer: Self-pay

## 2011-03-20 ENCOUNTER — Encounter (HOSPITAL_COMMUNITY): Payer: Self-pay | Admitting: *Deleted

## 2011-03-20 DIAGNOSIS — R51 Headache: Secondary | ICD-10-CM | POA: Insufficient documentation

## 2011-03-20 DIAGNOSIS — Z86718 Personal history of other venous thrombosis and embolism: Secondary | ICD-10-CM | POA: Insufficient documentation

## 2011-03-20 DIAGNOSIS — R6883 Chills (without fever): Secondary | ICD-10-CM | POA: Insufficient documentation

## 2011-03-20 DIAGNOSIS — R079 Chest pain, unspecified: Secondary | ICD-10-CM | POA: Insufficient documentation

## 2011-03-20 DIAGNOSIS — M940 Chondrocostal junction syndrome [Tietze]: Secondary | ICD-10-CM

## 2011-03-20 DIAGNOSIS — F341 Dysthymic disorder: Secondary | ICD-10-CM | POA: Insufficient documentation

## 2011-03-20 DIAGNOSIS — I10 Essential (primary) hypertension: Secondary | ICD-10-CM | POA: Insufficient documentation

## 2011-03-20 DIAGNOSIS — R05 Cough: Secondary | ICD-10-CM | POA: Insufficient documentation

## 2011-03-20 DIAGNOSIS — J3489 Other specified disorders of nose and nasal sinuses: Secondary | ICD-10-CM | POA: Insufficient documentation

## 2011-03-20 DIAGNOSIS — R059 Cough, unspecified: Secondary | ICD-10-CM | POA: Insufficient documentation

## 2011-03-20 DIAGNOSIS — Z79899 Other long term (current) drug therapy: Secondary | ICD-10-CM | POA: Insufficient documentation

## 2011-03-20 DIAGNOSIS — J45909 Unspecified asthma, uncomplicated: Secondary | ICD-10-CM | POA: Insufficient documentation

## 2011-03-20 DIAGNOSIS — J40 Bronchitis, not specified as acute or chronic: Secondary | ICD-10-CM

## 2011-03-20 DIAGNOSIS — Z7982 Long term (current) use of aspirin: Secondary | ICD-10-CM | POA: Insufficient documentation

## 2011-03-20 DIAGNOSIS — E119 Type 2 diabetes mellitus without complications: Secondary | ICD-10-CM | POA: Insufficient documentation

## 2011-03-20 LAB — GLUCOSE, CAPILLARY: Glucose-Capillary: 131 mg/dL — ABNORMAL HIGH (ref 70–99)

## 2011-03-20 MED ORDER — DIPHENHYDRAMINE HCL 50 MG/ML IJ SOLN
25.0000 mg | Freq: Once | INTRAMUSCULAR | Status: AC
  Administered 2011-03-21: 50 mg via INTRAVENOUS
  Filled 2011-03-20: qty 1

## 2011-03-20 MED ORDER — HYDROCOD POLST-CHLORPHEN POLST 10-8 MG/5ML PO LQCR
5.0000 mL | Freq: Once | ORAL | Status: AC
  Administered 2011-03-21: 5 mL via ORAL
  Filled 2011-03-20: qty 5

## 2011-03-20 MED ORDER — METOCLOPRAMIDE HCL 5 MG/ML IJ SOLN
10.0000 mg | Freq: Once | INTRAMUSCULAR | Status: AC
  Administered 2011-03-21: 10 mg via INTRAVENOUS
  Filled 2011-03-20: qty 2

## 2011-03-20 MED ORDER — KETOROLAC TROMETHAMINE 30 MG/ML IJ SOLN
30.0000 mg | Freq: Once | INTRAMUSCULAR | Status: AC
  Administered 2011-03-21: 30 mg via INTRAVENOUS
  Filled 2011-03-20: qty 1

## 2011-03-20 MED ORDER — SODIUM CHLORIDE 0.9 % IV BOLUS (SEPSIS)
1000.0000 mL | Freq: Once | INTRAVENOUS | Status: AC
  Administered 2011-03-21: 1000 mL via INTRAVENOUS

## 2011-03-20 NOTE — ED Notes (Signed)
She has had a cold and cough since last Wednesday and she was seen here then.  Since then she has developed a headache chest congestion and pain with a productive cough thick green and she has also had a temp.  She had a chest xray wednesday

## 2011-03-21 LAB — POCT I-STAT, CHEM 8
BUN: 10 mg/dL (ref 6–23)
Calcium, Ion: 1.18 mmol/L (ref 1.12–1.32)
Chloride: 101 mEq/L (ref 96–112)
Creatinine, Ser: 1.1 mg/dL (ref 0.50–1.10)
Glucose, Bld: 142 mg/dL — ABNORMAL HIGH (ref 70–99)
HCT: 41 % (ref 36.0–46.0)
Hemoglobin: 13.9 g/dL (ref 12.0–15.0)
Potassium: 3.7 mEq/L (ref 3.5–5.1)
Sodium: 142 mEq/L (ref 135–145)
TCO2: 29 mmol/L (ref 0–100)

## 2011-03-21 MED ORDER — AZITHROMYCIN 250 MG PO TABS: 250.0000 mg | ORAL_TABLET | Freq: Once | ORAL | Status: AC

## 2011-03-21 MED ORDER — HYDROCOD POLST-CHLORPHEN POLST 10-8 MG/5ML PO LQCR: 5.0000 mL | Freq: Two times a day (BID) | ORAL | Status: DC | PRN

## 2011-03-21 MED ORDER — IBUPROFEN 400 MG PO TABS: 400.0000 mg | ORAL_TABLET | Freq: Three times a day (TID) | ORAL | Status: AC | PRN

## 2011-03-21 MED ORDER — ALBUTEROL SULFATE HFA 108 (90 BASE) MCG/ACT IN AERS: 2.0000 | INHALATION_SPRAY | RESPIRATORY_TRACT | Status: DC | PRN

## 2011-03-21 NOTE — ED Notes (Signed)
See nurse's note for further.

## 2011-03-21 NOTE — ED Notes (Signed)
Pt report received and care assumed from Barrie Lyme, RN. Pt is a/o x 3, NAD.  Pt c/o migraine headache with "mild" photophobia and "mild" nausea.  Ordered medications pulled from pyxis and unopened packages handed to me by reporting nurse.

## 2011-03-21 NOTE — ED Notes (Signed)
Pt medicated with "migraine cocktail" per orders.  All were well tolerated.

## 2011-03-21 NOTE — ED Notes (Signed)
CBG CHECKED BY R Julisa Flippo 131

## 2011-03-21 NOTE — ED Notes (Signed)
Pt resting quietly with eyes closed, resp equal/nonlabored.  Will continue to monitor.

## 2011-03-21 NOTE — ED Notes (Signed)
Assisted pt from lying to sitting to standing position.  All position changes well tolerated and no orthostasis noted.  Pt c/o mild vertigo that quickly resolved.  She denies n/v and now rates her HA at 3/10. D/c Rx and instructions reviewed with pt who expresses verbal understanding.  Awaiting ride from her daughter who is enroute. Pt assisted to ED waiting area w/o incident.

## 2011-03-24 NOTE — ED Provider Notes (Signed)
History     CSN: [id number] Arrival date & time: 03/20/2011  3:50 PM   First MD Initiated Contact with Patient 03/20/11 2303      Chief Complaint  Patient presents with  . Headache    HPI: Patient is a 50 y.o. female presenting with cough.  Cough This is a new problem. The current episode started more than 2 days ago. The problem has been gradually worsening. There has been no fever. Associated symptoms include chills and rhinorrhea. Pertinent negatives include no ear pain, no shortness of breath and no wheezing. She has tried decongestants and cough syrup for the symptoms. She is not a smoker. Her past medical history is significant for asthma.  Reports to cough for several days. Seen on Wednesday for chest pain and cough. Chest x-ray EKG and labs at that time but without acute findings. Since Wednesday cough has persisted and patient now complains of bilateral rib area pain which is worse with coughing deep breathing and certain positions. She also complains of persistent headache and sinus congestion.  Past Medical History  Diagnosis Date  . Hypertension   . Diabetes mellitus   . Asthma   . Anginal pain   . Depression   . Anxiety   . Pulmonary embolism     Past Surgical History  Procedure Date  . Abdominal hysterectomy   . Leg surgery     History reviewed. No pertinent family history.  History  Substance Use Topics  . Smoking status: Former Games developer  . Smokeless tobacco: Not on file  . Alcohol Use: No    OB History    Grav Para Term Preterm Abortions TAB SAB Ect Mult Living                  Review of Systems  Constitutional: Positive for chills.  HENT: Positive for rhinorrhea. Negative for ear pain.   Eyes: Negative.   Respiratory: Positive for cough. Negative for shortness of breath and wheezing.   Cardiovascular: Negative.   Gastrointestinal: Negative.   Genitourinary: Negative.   Musculoskeletal: Negative.   Skin: Negative.   Neurological: Negative.     Hematological: Negative.   Psychiatric/Behavioral: Negative.     Allergies  Penicillins and Sulfonamide derivatives  Home Medications   Current Outpatient Rx  Name Route Sig Dispense Refill  . ALBUTEROL 90 MCG/ACT IN AERS Inhalation Inhale 2 puffs into the lungs 3 (three) times daily as needed. For shortness of breath     . AMLODIPINE BESYLATE 10 MG PO TABS Oral Take 10 mg by mouth daily.      . ASPIRIN 81 MG PO TABS Oral Take 81 mg by mouth daily.      . ATORVASTATIN CALCIUM 20 MG PO TABS Oral Take 20 mg by mouth daily.      . CYCLOBENZAPRINE HCL 10 MG PO TABS Oral Take 10 mg by mouth daily.      [name] DIAZEPAM 5 MG PO TABS Oral Take 5 mg by mouth at bedtime as needed. For anxiety     . ISOSORBIDE DINITRATE 20 MG PO TABS Oral Take 10 mg by mouth 2 (two) times daily.      [name] LISINOPRIL-HYDROCHLOROTHIAZIDE 20-12.5 MG PO TABS Oral Take 1 tablet by mouth daily.      [name] METFORMIN HCL 500 MG PO TABS Oral Take 500 mg by mouth 2 (two) times daily.      . MULTI-VITAMIN/MINERALS PO TABS Oral Take 1 tablet by mouth daily.      [name]  NABUMETONE 500 MG PO TABS Oral Take 500 mg by mouth 2 (two) times daily.     . SERTRALINE HCL 100 MG PO TABS Oral Take 100 mg by mouth daily.     . AZITHROMYCIN 250 MG PO TABS Oral Take 1 tablet (250 mg total) by mouth once. 6 tablet 0    Take 2 tabs PO on day i then 1 tab on days 2-5  . HYDROCOD POLST-CHLORPHEN POLST 10-8 MG/5ML PO LQCR Oral Take 5 mLs by mouth every 12 (twelve) hours as needed. 50 mL 0  . IBUPROFEN 400 MG PO TABS Oral Take 1 tablet (400 mg total) by mouth every 8 (eight) hours as needed for pain. 15 tablet 0    Take one 3 times a day for 3 days then as needed o ...    BP 166/84  Pulse 93  Temp(Src) 98.4 F (36.9 C) (Oral)  Resp 15  SpO2 100%  Physical Exam  Constitutional: She is oriented to person, place, and time. She appears well-developed and well-nourished.  HENT:  Head: Normocephalic and atraumatic.  Eyes: Conjunctivae are normal.  Neck:  Neck supple.  Cardiovascular: Normal rate and regular rhythm.   Pulmonary/Chest: Effort normal and breath sounds normal.    Abdominal: Soft. Bowel sounds are normal.    Musculoskeletal: Normal range of motion.  Neurological: She is alert and oriented to person, place, and time.  Skin: Skin is warm and dry.  Psychiatric: She has a normal mood and affect.    ED Course  Procedures Findings and impression discussed with patient. Will plan to discharge home with treatment for bronchitis and costochondritis. Patient is agreeable with plan.  Labs Reviewed  GLUCOSE, CAPILLARY - Abnormal; Notable for the following:    Glucose-Capillary 131 (*)    All other components within normal limits  POCT I-STAT, CHEM 8 - Abnormal; Notable for the following:    Glucose, Bld 142 (*)    All other components within normal limits  LAB REPORT - SCANNED   No results found.   1. Headache   2. Bronchitis   3. Costal chondritis       MDM  HPI, PE and clinical course c/w bronchitis.        Leanne Chang, NP 03/26/11 [phone]

## 2011-03-27 NOTE — ED Provider Notes (Signed)
Medical screening examination/treatment/procedure(s) were performed by non-physician practitioner and as supervising physician I was immediately available for consultation/collaboration.  Michaeljoseph Revolorio, MD 03/27/11 1558 

## 2011-03-30 ENCOUNTER — Other Ambulatory Visit (HOSPITAL_COMMUNITY): Payer: Self-pay | Admitting: Family Medicine

## 2011-03-30 DIAGNOSIS — Z1231 Encounter for screening mammogram for malignant neoplasm of breast: Secondary | ICD-10-CM

## 2011-04-06 ENCOUNTER — Other Ambulatory Visit (HOSPITAL_COMMUNITY)
Admission: RE | Admit: 2011-04-06 | Discharge: 2011-04-06 | Disposition: A | Discharge status: Home / Self Care | Payer: Medicare Other | Source: Ambulatory Visit | Attending: Internal Medicine | Admitting: Internal Medicine

## 2011-04-06 ENCOUNTER — Other Ambulatory Visit: Payer: Self-pay | Admitting: Family Medicine

## 2011-04-06 DIAGNOSIS — Z1159 Encounter for screening for other viral diseases: Secondary | ICD-10-CM | POA: Insufficient documentation

## 2011-04-06 DIAGNOSIS — Z124 Encounter for screening for malignant neoplasm of cervix: Secondary | ICD-10-CM | POA: Insufficient documentation

## 2011-04-06 DIAGNOSIS — Z113 Encounter for screening for infections with a predominantly sexual mode of transmission: Secondary | ICD-10-CM | POA: Insufficient documentation

## 2011-04-11 ENCOUNTER — Encounter: Payer: Self-pay | Admitting: Cardiovascular Disease

## 2011-04-11 ENCOUNTER — Ambulatory Visit (INDEPENDENT_AMBULATORY_CARE_PROVIDER_SITE_OTHER): Payer: Medicare Other | Admitting: Cardiovascular Disease

## 2011-04-11 ENCOUNTER — Encounter: Payer: Self-pay | Admitting: *Deleted

## 2011-04-11 DIAGNOSIS — J449 Chronic obstructive pulmonary disease, unspecified: Secondary | ICD-10-CM

## 2011-04-11 DIAGNOSIS — R079 Chest pain, unspecified: Secondary | ICD-10-CM

## 2011-04-11 DIAGNOSIS — E7800 Pure hypercholesterolemia, unspecified: Secondary | ICD-10-CM

## 2011-04-11 DIAGNOSIS — R0609 Other forms of dyspnea: Secondary | ICD-10-CM

## 2011-04-11 DIAGNOSIS — R0989 Other specified symptoms and signs involving the circulatory and respiratory systems: Secondary | ICD-10-CM

## 2011-04-11 DIAGNOSIS — R0600 Dyspnea, unspecified: Secondary | ICD-10-CM

## 2011-04-11 NOTE — Patient Instructions (Signed)
Your physician recommends that you schedule a follow-up appointment in:  AFTER TESTS DONE   SEE DR Jens Som Your physician recommends that you continue on your current medications as directed. Please refer to the Current Medication list given to you today. Your physician has requested that you have an echocardiogram. Echocardiography is a painless test that uses sound waves to create images of your heart. It provides your doctor with information about the size and shape of your heart and how well your heart's chambers and valves are working. This procedure takes approximately one hour. There are no restrictions for this procedure.  DX DYSPNEA Your physician has requested that you have en exercise stress myoview. For further information please visit [email or web address] Please follow instruction sheet, as given. DX CHEST PAIN

## 2011-04-11 NOTE — Progress Notes (Signed)
50 yo added on to my schedule for SSCP.  Previously seen by Dr [place] with normal heart cath in 2008.  Recently consulted on by Dr Jens Som 2/12 for SSCP.  Past medical history of diet- controlled diabetes mellitus but started on pills a cuple of months ago, hypertension, obstructive sleep apnea who  I am asked to evaluate for chest pain. The patient did undergo cardiac catheterization in December 2008. At that time, she was found to have  no coronary artery disease and normal LV function. She apparently has had intermittent chest pain for years. She was treated in January 2008  with Coumadin for 6 months for a question of pulmonary embolus on CT. She did have her last CTA in October 2011, which showed no pulmonary  Embolus.   Recent URI with worsening dyspnea.  Mild chronic LE edema.  Atypica sharp pains in back and shoulder.  Sedentary.  Quit smoking many years ago No fever or cough.  No hemoptysis.  NO change in LE edema.  Compliant with meds but not always diet  ROS: Denies fever, malais, weight loss, blurry vision, decreased visual acuity, cough, sputum, SOB, hemoptysis, pleuritic pain, palpitaitons, heartburn, abdominal pain, melena, lower extremity edema, claudication, or rash.  All other systems reviewed and negative   General: Affect appropriate Healthy:  appears stated age HEENT: normal Neck supple with no adenopathy JVP normal no bruits no thyromegaly Lungs clear with no wheezing and good diaphragmatic motion Heart:  S1/S2 no murmur,rub, gallop or click PMI normal Abdomen: benighn, BS positve, no tenderness, no AAA no bruit.  No HSM or HJR Distal pulses intact with no bruits No edema Neuro non-focal Skin warm and dry No muscular weakness  Medications Current Outpatient Prescriptions  Medication Sig Dispense Refill  . albuterol (PROVENTIL,VENTOLIN) 90 MCG/ACT inhaler Inhale 2 puffs into the lungs 3 (three) times daily as needed. For shortness of breath       . aspirin 81 MG  tablet Take 81 mg by mouth daily.        [name] atorvastatin (LIPITOR) 20 MG tablet Take 20 mg by mouth daily.        . cloNIDine (CATAPRES) 0.1 MG tablet Take 0.1 mg by mouth 2 (two) times daily.        . cyclobenzaprine (FLEXERIL) 10 MG tablet Take 10 mg by mouth daily.        . diazepam (VALIUM) 5 MG tablet Take 5 mg by mouth at bedtime.        [name] ibuprofen (ADVIL,MOTRIN) 400 MG tablet Take 400 mg by mouth every 6 (six) hours as needed.        . isosorbide dinitrate (ISORDIL) 20 MG tablet Take 10 mg by mouth 2 (two) times daily.        [name] lisinopril-hydrochlorothiazide (PRINZIDE,ZESTORETIC) 20-12.5 MG per tablet Take 1 tablet by mouth daily.        . metFORMIN (GLUCOPHAGE) 500 MG tablet Take 500 mg by mouth 2 (two) times daily.        . Multiple Vitamins-Minerals (MULTIVITAMIN WITH MINERALS) tablet Take 1 tablet by mouth daily.        . nabumetone (RELAFEN) 500 MG tablet Take 500 mg by mouth 2 (two) times daily.       [name] omeprazole (PRILOSEC) 20 MG capsule Take 20 mg by mouth daily.        . sertraline (ZOLOFT) 100 MG tablet Take 100 mg by mouth daily.       . traMADol Janean Sark)  50 MG tablet Take 50 mg by mouth every 6 (six) hours as needed. Maximum dose= 8 tablets per day         Allergies Penicillins and Sulfonamide derivatives  Family History: History reviewed. No pertinent family history.  Social History: History   Social History  . Marital Status: Legally Separated    Spouse Name: N/A    Number of Children: N/A  . Years of Education: N/A   Occupational History  . Not on file.   Social History Main Topics  . Smoking status: Former Games developer  . Smokeless tobacco: Not on file  . Alcohol Use: No  . Drug Use: No  . Sexually Active:    Other Topics Concern  . Not on file   Social History Narrative  . No narrative on file    Electrocardiogram:   03/20/11  NSR 88 normal  Assessment and Plan

## 2011-04-11 NOTE — Assessment & Plan Note (Signed)
Cholesterol is at goal.  Continue current dose of statin and diet Rx.  No myalgias or side effects.  F/U  LFT's in 6 months. Lab Results  Component Value Date   LDLCALC 64 05/11/2009

## 2011-04-11 NOTE — Assessment & Plan Note (Signed)
Atypical but worsening DM requiring Rx and cath was in 2008.  F/U myovue.  If both tests normal consider d-dimer  Will F/U with Dr Jens Som who just saw her in 2/12

## 2011-04-11 NOTE — Assessment & Plan Note (Signed)
Dyspnea more likely from this and body habitus with asthmatic component  Echo

## 2011-04-20 ENCOUNTER — Encounter: Payer: Self-pay | Admitting: Cardiovascular Disease

## 2011-04-22 ENCOUNTER — Inpatient Hospital Stay (HOSPITAL_COMMUNITY)
Admission: EM | Admit: 2011-04-22 | Discharge: 2011-04-23 | DRG: 206 | Disposition: A | Discharge status: Home / Self Care | Payer: Medicare Other | Attending: Cardiology | Admitting: Cardiology

## 2011-04-22 ENCOUNTER — Encounter (HOSPITAL_COMMUNITY): Payer: Self-pay | Admitting: Emergency Medicine

## 2011-04-22 ENCOUNTER — Other Ambulatory Visit: Payer: Self-pay

## 2011-04-22 ENCOUNTER — Emergency Department (HOSPITAL_COMMUNITY): Payer: Medicare Other

## 2011-04-22 DIAGNOSIS — M940 Chondrocostal junction syndrome [Tietze]: Principal | ICD-10-CM | POA: Diagnosis present

## 2011-04-22 DIAGNOSIS — Z87891 Personal history of nicotine dependence: Secondary | ICD-10-CM

## 2011-04-22 DIAGNOSIS — Z882 Allergy status to sulfonamides status: Secondary | ICD-10-CM

## 2011-04-22 DIAGNOSIS — I10 Essential (primary) hypertension: Secondary | ICD-10-CM | POA: Diagnosis present

## 2011-04-22 DIAGNOSIS — Z86711 Personal history of pulmonary embolism: Secondary | ICD-10-CM

## 2011-04-22 DIAGNOSIS — Z880 Allergy status to penicillin: Secondary | ICD-10-CM

## 2011-04-22 DIAGNOSIS — Z79899 Other long term (current) drug therapy: Secondary | ICD-10-CM

## 2011-04-22 DIAGNOSIS — Z23 Encounter for immunization: Secondary | ICD-10-CM

## 2011-04-22 DIAGNOSIS — F341 Dysthymic disorder: Secondary | ICD-10-CM | POA: Diagnosis present

## 2011-04-22 DIAGNOSIS — R0789 Other chest pain: Secondary | ICD-10-CM | POA: Diagnosis present

## 2011-04-22 DIAGNOSIS — R599 Enlarged lymph nodes, unspecified: Secondary | ICD-10-CM | POA: Diagnosis present

## 2011-04-22 DIAGNOSIS — R079 Chest pain, unspecified: Secondary | ICD-10-CM | POA: Diagnosis present

## 2011-04-22 DIAGNOSIS — J45909 Unspecified asthma, uncomplicated: Secondary | ICD-10-CM | POA: Diagnosis present

## 2011-04-22 DIAGNOSIS — E119 Type 2 diabetes mellitus without complications: Secondary | ICD-10-CM | POA: Diagnosis present

## 2011-04-22 DIAGNOSIS — Z7982 Long term (current) use of aspirin: Secondary | ICD-10-CM

## 2011-04-22 LAB — DIFFERENTIAL
Basophils Absolute: 0 10*3/uL (ref 0.0–0.1)
Basophils Relative: 0 % (ref 0–1)
Eosinophils Absolute: 0.2 10*3/uL (ref 0.0–0.7)
Eosinophils Relative: 2 % (ref 0–5)
Lymphocytes Relative: 43 % (ref 12–46)
Lymphs Abs: 3 10*3/uL (ref 0.7–4.0)
Monocytes Absolute: 0.5 10*3/uL (ref 0.1–1.0)
Monocytes Relative: 8 % (ref 3–12)
Neutro Abs: 3.3 10*3/uL (ref 1.7–7.7)
Neutrophils Relative %: 47 % (ref 43–77)

## 2011-04-22 LAB — CBC
HCT: 37.7 % (ref 36.0–46.0)
Hemoglobin: 12.5 g/dL (ref 12.0–15.0)
MCH: 28.7 pg (ref 26.0–34.0)
MCHC: 33.2 g/dL (ref 30.0–36.0)
MCV: 86.7 fL (ref 78.0–100.0)
Platelets: 192 10*3/uL (ref 150–400)
RBC: 4.35 MIL/uL (ref 3.87–5.11)
RDW: 13.3 % (ref 11.5–15.5)
WBC: 7 10*3/uL (ref 4.0–10.5)

## 2011-04-22 LAB — GLUCOSE, CAPILLARY: Glucose-Capillary: 101 mg/dL — ABNORMAL HIGH (ref 70–99)

## 2011-04-22 LAB — POCT I-STAT, CHEM 8
BUN: 7 mg/dL (ref 6–23)
Calcium, Ion: 1.24 mmol/L (ref 1.12–1.32)
Chloride: 103 mEq/L (ref 96–112)
Creatinine, Ser: 0.9 mg/dL (ref 0.50–1.10)
Glucose, Bld: 112 mg/dL — ABNORMAL HIGH (ref 70–99)
HCT: 39 % (ref 36.0–46.0)
Hemoglobin: 13.3 g/dL (ref 12.0–15.0)
Potassium: 3.5 mEq/L (ref 3.5–5.1)
Sodium: 144 mEq/L (ref 135–145)
TCO2: 28 mmol/L (ref 0–100)

## 2011-04-22 LAB — CARDIAC PANEL(CRET KIN+CKTOT+MB+TROPI)
CK, MB: 2.4 ng/mL (ref 0.3–4.0)
Relative Index: 2.3 (ref 0.0–2.5)
Total CK: 103 U/L (ref 7–177)
Troponin I: <0.3 ng/mL (ref <0.30)

## 2011-04-22 LAB — D-DIMER, QUANTITATIVE (NOT AT ARMC): D-Dimer, Quant: 0.63 ug/mL-FEU — ABNORMAL HIGH (ref 0.00–0.48)

## 2011-04-22 MED ORDER — ASPIRIN 81 MG PO CHEW
243.0000 mg | CHEWABLE_TABLET | Freq: Once | ORAL | Status: AC
  Administered 2011-04-22: 243 mg via ORAL
  Filled 2011-04-22: qty 3

## 2011-04-22 MED ORDER — MORPHINE SULFATE 4 MG/ML IJ SOLN
4.0000 mg | Freq: Once | INTRAMUSCULAR | Status: AC
  Administered 2011-04-22: 4 mg via INTRAVENOUS
  Filled 2011-04-22: qty 1

## 2011-04-22 MED ORDER — ONDANSETRON HCL 4 MG/2ML IJ SOLN
4.0000 mg | Freq: Once | INTRAMUSCULAR | Status: AC
  Administered 2011-04-22: 4 mg via INTRAVENOUS
  Filled 2011-04-22: qty 2

## 2011-04-22 MED ORDER — IOHEXOL 350 MG/ML SOLN
100.0000 mL | Freq: Once | INTRAVENOUS | Status: AC | PRN
  Administered 2011-04-22: 100 mL via INTRAVENOUS

## 2011-04-22 NOTE — ED Provider Notes (Signed)
History    this is a 50 year old female presenting to the ED with chief complaints of chest pain and shortness of breath. Patient states since yesterday she has been having intermittent bouts of shortness of breath.  This morning she  experiencing chest pain and pressure to her mid chest. Pain is radiating to the back. Increasing pain with movement and when taking deep breaths. She does admits to mild nonproductive cough, and headache. She is receiving nausea without vomiting or diarrhea. She states her chest pain is ongoing. She denies fever, productive cough, rash, recent trauma. She is scheduled to have a cardiac stress test in January. She has had prior pulmonary embolism, and states this pain feels similar. She denies taking birth control pill, recent travel, recent surgery. She has had similar symptoms for the past several months and has been to the ED 3 times for same complaint.  Patient states she has just got over the bronchitis.  CSN: [id number]  Arrival date & time 04/22/11  1402   First MD Initiated Contact with Patient 04/22/11 1534      Chief Complaint  Patient presents with  . Chest Pain    (Consider location/radiation/quality/duration/timing/severity/associated sxs/prior treatment) HPI  Past Medical History  Diagnosis Date  . Hypertension   . Diabetes mellitus   . Asthma   . Anginal pain   . Depression   . Anxiety   . Pulmonary embolism     Past Surgical History  Procedure Date  . Abdominal hysterectomy   . Leg surgery     History reviewed. No pertinent family history.  History  Substance Use Topics  . Smoking status: Former Games developer  . Smokeless tobacco: Not on file  . Alcohol Use: No    OB History    Grav Para Term Preterm Abortions TAB SAB Ect Mult Living                  Review of Systems  All other systems reviewed and are negative.    Allergies  Penicillins and Sulfonamide derivatives  Home Medications   Current Outpatient Rx  Name  Route Sig Dispense Refill  . ALBUTEROL 90 MCG/ACT IN AERS Inhalation Inhale 2 puffs into the lungs 3 (three) times daily as needed. For shortness of breath     . ASPIRIN 81 MG PO TABS Oral Take 81 mg by mouth daily.      . ATORVASTATIN CALCIUM 20 MG PO TABS Oral Take 20 mg by mouth daily.      [name] CLONIDINE HCL 0.1 MG PO TABS Oral Take 0.1 mg by mouth 2 (two) times daily.      . CYCLOBENZAPRINE HCL 10 MG PO TABS Oral Take 10 mg by mouth daily.      [name] DIAZEPAM 5 MG PO TABS Oral Take 5 mg by mouth at bedtime.      . IBUPROFEN 400 MG PO TABS Oral Take 400 mg by mouth every 6 (six) hours as needed. For pain    . ISOSORBIDE DINITRATE 20 MG PO TABS Oral Take 10 mg by mouth 2 (two) times daily.      [name] LISINOPRIL-HYDROCHLOROTHIAZIDE 20-12.5 MG PO TABS Oral Take 1 tablet by mouth daily.      [name] METFORMIN HCL 500 MG PO TABS Oral Take 1,000 mg by mouth 2 (two) times daily.     . MULTI-VITAMIN/MINERALS PO TABS Oral Take 1 tablet by mouth daily.      [name] NABUMETONE 500 MG PO TABS Oral Take 500  mg by mouth 2 (two) times daily.     [name] OMEPRAZOLE 20 MG PO CPDR Oral Take 20 mg by mouth daily.      . SERTRALINE HCL 100 MG PO TABS Oral Take 100 mg by mouth daily.     . TRAMADOL HCL 50 MG PO TABS Oral Take 50 mg by mouth every 6 (six) hours as needed. For pain, Maximum dose= 8 tablets per day      BP 126/69  Pulse 84  Temp(Src) 98.3 F (36.8 C) (Oral)  Resp 20  SpO2 97%  Physical Exam  Nursing note and vitals reviewed. Constitutional: She is oriented to person, place, and time. She appears well-developed and well-nourished.       Awake, alert, nontoxic appearance  HENT:  Head: Atraumatic.  Eyes: Right eye exhibits no discharge. Left eye exhibits no discharge.  Neck: Neck supple.  Cardiovascular: Normal rate and regular rhythm.   Pulmonary/Chest: Effort normal. No respiratory distress. She has no wheezes. She has no rales. She exhibits no tenderness.  Abdominal: Soft. There is no tenderness. There is no  rebound.  Musculoskeletal: She exhibits no tenderness.       Baseline ROM, no obvious new focal weakness  Neurological: She is alert and oriented to person, place, and time.       Mental status and motor strength appears baseline for patient and situation  Skin: No rash noted.  Psychiatric: She has a normal mood and affect.    ED Course  Procedures (including critical care time)     1. Chest pain      Date: 04/22/2011  Rate: 86  Rhythm: normal sinus rhythm  QRS Axis: normal  Intervals: normal  ST/T Wave abnormalities: normal  Conduction Disutrbances:none  Narrative Interpretation:   Old EKG Reviewed: unchanged    MDM  Patient with history of prior PE, and no recent cardiac stress test, presenting with chest pain and shortness of breath. Patient would like the require admission for further evaluation as her symptoms has been recurrent.  Will obtain CXR, ECG, basic labs.  I will check d-dimer to r/o PE.  5:28 PM  EKG is unremarkable. The cardiac enzymes is unremarkable. Basic labs are without any acute finding. However, her d-dimer is elevated. Therefore, a chest CT a will be obtained. Currently her chest x-ray shows central mild vascular congestion without convincing pulmonary edema and no evidence of focal infiltrates.   7:38 PM Chest CTA shows no evidence of a PE, or infection. I have consult with on-call cardiology, Dr. Nelle Don, who has agrees to see patient in the ED and admit for a stress test. Patient is currently in no acute distress, and her vital signs stable.  Medical screening examination/treatment/procedure(s) were conducted as a shared visit with non-physician practitioner(s) and myself.  I personally evaluated the patient during the encounter 50 year old woman with chest pain, several prior ED visits over the past year for this.  She has a prior history of pulmonary embolism.  Her workup has been negative, including CTA of chest showing no PE.  Her pain  continues, so [facility] asked to admit patient for a chest pain workup. Osvaldo Human, M.D.    Fayrene Helper, PA 04/22/11 2025  Carleene Cooper III, MD 04/23/11 [phone]

## 2011-04-22 NOTE — ED Notes (Signed)
C/o midsternal CP radiating thru to back with SOB, nausea & diaphoresis upon waking up this morning. Pain worse with deep breaths, mvmt & palpation. States getting over bronchitis. Reports less CP when laying down. Has a stress test scheduled next month. Denies cold, cough, fever presently.

## 2011-04-22 NOTE — ED Notes (Signed)
CBG CHECKED BY EMT R Sarie Stall-101

## 2011-04-22 NOTE — ED Notes (Signed)
Patient transported to X-ray 

## 2011-04-22 NOTE — ED Notes (Signed)
Patient is resting comfortably.Awaiting CT scan 

## 2011-04-22 NOTE — ED Notes (Signed)
Pt reports center chest pain and back pain that increases with deep breathing. Pt c/o shortness of breath, headache, non productive cough, light headed and jittery. Pt checked CBG at home 109.

## 2011-04-22 NOTE — ED Notes (Signed)
Returned from CT scan.

## 2011-04-23 ENCOUNTER — Inpatient Hospital Stay (HOSPITAL_COMMUNITY): Payer: Medicare Other

## 2011-04-23 ENCOUNTER — Encounter (HOSPITAL_COMMUNITY): Payer: Self-pay | Admitting: Cardiovascular Disease

## 2011-04-23 ENCOUNTER — Other Ambulatory Visit: Payer: Self-pay

## 2011-04-23 DIAGNOSIS — R079 Chest pain, unspecified: Secondary | ICD-10-CM

## 2011-04-23 LAB — BASIC METABOLIC PANEL
BUN: 10 mg/dL (ref 6–23)
CO2: 28 mEq/L (ref 19–32)
Calcium: 9.1 mg/dL (ref 8.4–10.5)
Chloride: 102 mEq/L (ref 96–112)
Creatinine, Ser: 0.87 mg/dL (ref 0.50–1.10)
GFR calc Af Amer: 88 mL/min — ABNORMAL LOW (ref >90)
GFR calc non Af Amer: 76 mL/min — ABNORMAL LOW (ref >90)
Glucose, Bld: 107 mg/dL — ABNORMAL HIGH (ref 70–99)
Potassium: 3.6 mEq/L (ref 3.5–5.1)
Sodium: 140 mEq/L (ref 135–145)

## 2011-04-23 LAB — LIPID PANEL
Cholesterol: 171 mg/dL (ref 0–200)
Cholesterol: 173 mg/dL (ref 0–200)
HDL: 40 mg/dL (ref >39)
HDL: 41 mg/dL (ref >39)
LDL Cholesterol: 102 mg/dL — ABNORMAL HIGH (ref 0–99)
LDL Cholesterol: 103 mg/dL — ABNORMAL HIGH (ref 0–99)
Total CHOL/HDL Ratio: 4.3 RATIO
Total CHOL/HDL Ratio: 4.2 RATIO
Triglycerides: 143 mg/dL (ref <150)
Triglycerides: 147 mg/dL (ref <150)
VLDL: 29 mg/dL (ref 0–40)
VLDL: 29 mg/dL (ref 0–40)

## 2011-04-23 LAB — CBC
HCT: 36.8 % (ref 36.0–46.0)
Hemoglobin: 12.3 g/dL (ref 12.0–15.0)
MCH: 29.4 pg (ref 26.0–34.0)
MCHC: 33.4 g/dL (ref 30.0–36.0)
MCV: 87.8 fL (ref 78.0–100.0)
Platelets: 193 10*3/uL (ref 150–400)
RBC: 4.19 MIL/uL (ref 3.87–5.11)
RDW: 13.6 % (ref 11.5–15.5)
WBC: 6 10*3/uL (ref 4.0–10.5)

## 2011-04-23 LAB — PROTIME-INR
INR: 0.91 (ref 0.00–1.49)
Prothrombin Time: 12.5 seconds (ref 11.6–15.2)

## 2011-04-23 LAB — GLUCOSE, CAPILLARY
Glucose-Capillary: 117 mg/dL — ABNORMAL HIGH (ref 70–99)
Glucose-Capillary: 110 mg/dL — ABNORMAL HIGH (ref 70–99)

## 2011-04-23 LAB — CARDIAC PANEL(CRET KIN+CKTOT+MB+TROPI)
CK, MB: 2.3 ng/mL (ref 0.3–4.0)
Relative Index: INVALID (ref 0.0–2.5)
Total CK: 75 U/L (ref 7–177)
Troponin I: <0.3 ng/mL (ref <0.30)

## 2011-04-23 MED ORDER — SIMVASTATIN 40 MG PO TABS
40.0000 mg | ORAL_TABLET | Freq: Every day | ORAL | Status: DC
  Filled 2011-04-23: qty 1

## 2011-04-23 MED ORDER — NITROGLYCERIN 0.4 MG SL SUBL
SUBLINGUAL_TABLET | SUBLINGUAL | Status: AC
  Administered 2011-04-23: 0.4 mg via SUBLINGUAL
  Filled 2011-04-23: qty 25

## 2011-04-23 MED ORDER — CLONIDINE HCL 0.1 MG PO TABS
0.1000 mg | ORAL_TABLET | Freq: Two times a day (BID) | ORAL | Status: DC
  Administered 2011-04-23: 0.1 mg via ORAL
  Filled 2011-04-23 (×2): qty 1

## 2011-04-23 MED ORDER — OXYCODONE-ACETAMINOPHEN 5-325 MG PO TABS
ORAL_TABLET | ORAL | Status: AC
  Administered 2011-04-23: 1 via ORAL
  Filled 2011-04-23: qty 1

## 2011-04-23 MED ORDER — ISOSORBIDE DINITRATE 10 MG PO TABS
10.0000 mg | ORAL_TABLET | Freq: Two times a day (BID) | ORAL | Status: DC
  Administered 2011-04-23: 10 mg via ORAL
  Filled 2011-04-23 (×3): qty 1

## 2011-04-23 MED ORDER — OXYCODONE-ACETAMINOPHEN 5-325 MG PO TABS
1.0000 | ORAL_TABLET | Freq: Once | ORAL | Status: AC
  Administered 2011-04-23: 1 via ORAL

## 2011-04-23 MED ORDER — ENOXAPARIN SODIUM 40 MG/0.4ML ~~LOC~~ SOLN
40.0000 mg | Freq: Every day | SUBCUTANEOUS | Status: DC
  Administered 2011-04-23: 40 mg via SUBCUTANEOUS
  Filled 2011-04-23: qty 0.4

## 2011-04-23 MED ORDER — IBUPROFEN 600 MG PO TABS
600.0000 mg | ORAL_TABLET | Freq: Three times a day (TID) | ORAL | Status: DC
  Administered 2011-04-23: 600 mg via ORAL
  Filled 2011-04-23 (×3): qty 1

## 2011-04-23 MED ORDER — SODIUM CHLORIDE 0.9 % IV SOLN: 250.0000 mL | INTRAVENOUS | Status: DC | PRN

## 2011-04-23 MED ORDER — SODIUM CHLORIDE 0.9 % IJ SOLN: 3.0000 mL | Freq: Two times a day (BID) | INTRAMUSCULAR | Status: DC

## 2011-04-23 MED ORDER — SODIUM CHLORIDE 0.9 % IJ SOLN: 3.0000 mL | INTRAMUSCULAR | Status: DC | PRN

## 2011-04-23 MED ORDER — NITROGLYCERIN 0.4 MG SL SUBL: 0.4000 mg | SUBLINGUAL_TABLET | SUBLINGUAL | Status: DC | PRN

## 2011-04-23 MED ORDER — METFORMIN HCL 500 MG PO TABS
1000.0000 mg | ORAL_TABLET | Freq: Two times a day (BID) | ORAL | Status: DC
  Administered 2011-04-23: 1000 mg via ORAL
  Filled 2011-04-23 (×3): qty 2

## 2011-04-23 MED ORDER — TECHNETIUM TC 99M TETROFOSMIN IV KIT
30.0000 | PACK | Freq: Once | INTRAVENOUS | Status: AC | PRN
Start: 2011-04-23 — End: 2011-04-23
  Administered 2011-04-23: 30 via INTRAVENOUS

## 2011-04-23 MED ORDER — ASPIRIN 81 MG PO TABS: 81.0000 mg | ORAL_TABLET | Freq: Every day | ORAL | Status: DC

## 2011-04-23 MED ORDER — NITROGLYCERIN 0.4 MG SL SUBL
SUBLINGUAL_TABLET | SUBLINGUAL | Status: AC
  Administered 2011-04-23: 0.4 mg via SUBLINGUAL
  Filled 2011-04-23: qty 50

## 2011-04-23 MED ORDER — TECHNETIUM TC 99M TETROFOSMIN IV KIT
10.0000 | PACK | Freq: Once | INTRAVENOUS | Status: AC | PRN
  Administered 2011-04-23: 10 via INTRAVENOUS

## 2011-04-23 MED ORDER — ASPIRIN EC 81 MG PO TBEC: 81.0000 mg | DELAYED_RELEASE_TABLET | Freq: Every day | ORAL | Status: DC

## 2011-04-23 MED ORDER — ONDANSETRON HCL 4 MG/2ML IJ SOLN: 4.0000 mg | Freq: Four times a day (QID) | INTRAMUSCULAR | Status: DC | PRN

## 2011-04-23 MED ORDER — ADULT MULTIVITAMIN W/MINERALS CH
1.0000 | ORAL_TABLET | Freq: Every day | ORAL | Status: DC
  Administered 2011-04-23: 1 via ORAL
  Filled 2011-04-23: qty 1

## 2011-04-23 MED ORDER — ACETAMINOPHEN 325 MG PO TABS: 650.0000 mg | ORAL_TABLET | ORAL | Status: DC | PRN

## 2011-04-23 MED ORDER — DIAZEPAM 5 MG PO TABS: 5.0000 mg | ORAL_TABLET | Freq: Every day | ORAL | Status: DC

## 2011-04-23 MED ORDER — CYCLOBENZAPRINE HCL 10 MG PO TABS
10.0000 mg | ORAL_TABLET | Freq: Every day | ORAL | Status: DC
  Administered 2011-04-23: 10 mg via ORAL
  Filled 2011-04-23: qty 1

## 2011-04-23 MED ORDER — ALBUTEROL SULFATE HFA 108 (90 BASE) MCG/ACT IN AERS: 2.0000 | INHALATION_SPRAY | Freq: Three times a day (TID) | RESPIRATORY_TRACT | Status: DC | PRN

## 2011-04-23 MED ORDER — ALBUTEROL 90 MCG/ACT IN AERS: 2.0000 | INHALATION_SPRAY | Freq: Three times a day (TID) | RESPIRATORY_TRACT | Status: DC | PRN

## 2011-04-23 MED ORDER — SERTRALINE HCL 100 MG PO TABS
100.0000 mg | ORAL_TABLET | Freq: Every day | ORAL | Status: DC
  Administered 2011-04-23: 100 mg via ORAL
  Filled 2011-04-23: qty 1

## 2011-04-23 MED ORDER — REGADENOSON 0.4 MG/5ML IV SOLN
0.4000 mg | Freq: Once | INTRAVENOUS | Status: AC
  Administered 2011-04-23: 0.4 mg via INTRAVENOUS
  Filled 2011-04-23: qty 5

## 2011-04-23 MED ORDER — PANTOPRAZOLE SODIUM 40 MG PO TBEC
40.0000 mg | DELAYED_RELEASE_TABLET | Freq: Every day | ORAL | Status: DC
  Administered 2011-04-23: 40 mg via ORAL
  Filled 2011-04-23: qty 1

## 2011-04-23 NOTE — Progress Notes (Signed)
Patient ID: [name], female   DOB: October 17, 1960, 50 y.o.   MRN: [id number]   Patient admitted by fellow this am.  Very atypical chest pain, possible costochondritis.  Cardiac enzymes negative and ECG unchanged.  She is heading down for a myoview.  If nonischemic, she can go home later today.   Marca Ancona 04/23/2011 10:16 AM

## 2011-04-23 NOTE — Progress Notes (Signed)
Patient in Nuclear Medicine for Lexiscan Myoview HR increased to 120 bpm with Lexiscan injection. With Lexiscan injection, ECG with </= 1 mm down sloping inf-lat ST depression. Patient felt CP, SOB and flushed. Tolerated well. Images pending. Tereso Newcomer, PA-C  12:11 PM 04/23/2011

## 2011-04-23 NOTE — Progress Notes (Signed)
Can hardly feel pain in chest now, thinks it might be gone. Her nurse on 3700 aware of pain and that percocet was given.

## 2011-04-23 NOTE — ED Notes (Signed)
NSR ON MONITOR. RATE 80

## 2011-04-23 NOTE — H&P (Signed)
[name] is an 50 y.o. female. Chief Complaint: Chest pain HPI: Patient is a 50 yr old with HTN, hyperlipidemia, Diabetes and a prior history of heavy smoking. She has been having atypical chest pain for a very long time and her history is significant for a negative cardiac cath in 2008. Her chest pain started this morning upon waking up, it is pressure-like, substernal and 7/10 in intensity. She states that the pain worsens with movement, cough, breathing, touch and activity. There have been no associated relieving factors. She has had multiple episodes of what she describes as "bronchitis" but she is currently not coughing and denies fever or chills. There is no shortness or breath, no lightheadedness, no syncope, no palpitations, no leg edema, orthopnea or PND. Her history is also significant for a PE in 2008 but CTA today was negative for same. She has had several ED visits for chest pain and states that she was being prepared for an out patient stress test.  Past Medical History  Diagnosis Date  . Hypertension   . Diabetes mellitus   . Asthma   . Anginal pain   . Depression   . Anxiety   . Pulmonary embolism     Past Surgical History  Procedure Date  . Abdominal hysterectomy   . Leg surgery   . Cardiac catheterization     Normal    History reviewed. No pertinent family history. Social History:  reports that she has quit smoking. Her smoking use included Cigarettes. She has a 2.5 pack-year smoking history. She does not have any smokeless tobacco history on file. She reports that she does not drink alcohol or use illicit drugs.  Allergies:  Allergies  Allergen Reactions  . Penicillins Swelling  . Sulfonamide Derivatives Rash    Medications Prior to Admission  Medication Dose Route Frequency Provider Last Rate Last Dose  . aspirin chewable tablet 243 mg  243 mg Oral Once Carleene Cooper III, MD   243 mg at 04/22/11 1619  . iohexol (OMNIPAQUE) 350 MG/ML injection 100  mL  100 mL Intravenous Once PRN Carleene Cooper III, MD   100 mL at 04/22/11 1818  . morphine 4 MG/ML injection 4 mg  4 mg Intravenous Once Fayrene Helper, PA   4 mg at 04/22/11 1620  . morphine 4 MG/ML injection 4 mg  4 mg Intravenous Once Fayrene Helper, PA   4 mg at 04/22/11 2027  . ondansetron (ZOFRAN) injection 4 mg  4 mg Intravenous Once Fayrene Helper, PA   4 mg at 04/22/11 1620   Medications Prior to Admission  Medication Sig Dispense Refill  . albuterol (PROVENTIL,VENTOLIN) 90 MCG/ACT inhaler Inhale 2 puffs into the lungs 3 (three) times daily as needed. For shortness of breath       . aspirin 81 MG tablet Take 81 mg by mouth daily.        [name] atorvastatin (LIPITOR) 20 MG tablet Take 20 mg by mouth daily.        . cloNIDine (CATAPRES) 0.1 MG tablet Take 0.1 mg by mouth 2 (two) times daily.        . cyclobenzaprine (FLEXERIL) 10 MG tablet Take 10 mg by mouth daily.        . diazepam (VALIUM) 5 MG tablet Take 5 mg by mouth at bedtime.        [name] ibuprofen (ADVIL,MOTRIN) 400 MG tablet Take 400 mg by mouth every 6 (six) hours as needed. For pain      .  isosorbide dinitrate (ISORDIL) 20 MG tablet Take 10 mg by mouth 2 (two) times daily.        [name] lisinopril-hydrochlorothiazide (PRINZIDE,ZESTORETIC) 20-12.5 MG per tablet Take 1 tablet by mouth daily.        . metFORMIN (GLUCOPHAGE) 500 MG tablet Take 1,000 mg by mouth 2 (two) times daily.       . Multiple Vitamins-Minerals (MULTIVITAMIN WITH MINERALS) tablet Take 1 tablet by mouth daily.        . nabumetone (RELAFEN) 500 MG tablet Take 500 mg by mouth 2 (two) times daily.       [name] omeprazole (PRILOSEC) 20 MG capsule Take 20 mg by mouth daily.        . sertraline (ZOLOFT) 100 MG tablet Take 100 mg by mouth daily.       . traMADol (ULTRAM) 50 MG tablet Take 50 mg by mouth every 6 (six) hours as needed. For pain, Maximum dose= 8 tablets per day        Results for orders placed during the hospital encounter of 04/22/11 (from the past 48 hour(s))  CARDIAC  PANEL(CRET KIN+CKTOT+MB+TROPI)     Status: Normal   Collection Time   04/22/11  3:56 PM      Component Value Range Comment   Total CK 103  7 - 177 (U/L)    CK, MB 2.4  0.3 - 4.0 (ng/mL)    Troponin I <0.30  <0.30 (ng/mL)    Relative Index 2.3  0.0 - 2.5    D-DIMER, QUANTITATIVE     Status: Abnormal   Collection Time   04/22/11  4:15 PM      Component Value Range Comment   D-Dimer, Quant 0.63 (*) 0.00 - 0.48 (ug/mL-FEU)   POCT I-STAT, CHEM 8     Status: Abnormal   Collection Time   04/22/11  4:19 PM      Component Value Range Comment   Sodium 144  135 - 145 (mEq/L)    Potassium 3.5  3.5 - 5.1 (mEq/L)    Chloride 103  96 - 112 (mEq/L)    BUN 7  6 - 23 (mg/dL)    Creatinine, Ser 9.14  0.50 - 1.10 (mg/dL)    Glucose, Bld 782 (*) 70 - 99 (mg/dL)    Calcium, Ion 9.56  1.12 - 1.32 (mmol/L)    TCO2 28  0 - 100 (mmol/L)    Hemoglobin 13.3  12.0 - 15.0 (g/dL)    HCT 21.3  08.6 - 57.8 (%)   CBC     Status: Normal   Collection Time   04/22/11  4:36 PM      Component Value Range Comment   WBC 7.0  4.0 - 10.5 (K/uL)    RBC 4.35  3.87 - 5.11 (MIL/uL)    Hemoglobin 12.5  12.0 - 15.0 (g/dL)    HCT 46.9  62.9 - 52.8 (%)    MCV 86.7  78.0 - 100.0 (fL)    MCH 28.7  26.0 - 34.0 (pg)    MCHC 33.2  30.0 - 36.0 (g/dL)    RDW 41.3  24.4 - 01.0 (%)    Platelets 192  150 - 400 (K/uL)   DIFFERENTIAL     Status: Normal   Collection Time   04/22/11  4:36 PM      Component Value Range Comment   Neutrophils Relative 47  43 - 77 (%)    Neutro Abs 3.3  1.7 - 7.7 (K/uL)  Lymphocytes Relative 43  12 - 46 (%)    Lymphs Abs 3.0  0.7 - 4.0 (K/uL)    Monocytes Relative 8  3 - 12 (%)    Monocytes Absolute 0.5  0.1 - 1.0 (K/uL)    Eosinophils Relative 2  0 - 5 (%)    Eosinophils Absolute 0.2  0.0 - 0.7 (K/uL)    Basophils Relative 0  0 - 1 (%)    Basophils Absolute 0.0  0.0 - 0.1 (K/uL)   GLUCOSE, CAPILLARY     Status: Abnormal   Collection Time   04/22/11  7:56 PM      Component Value Range Comment    Glucose-Capillary 101 (*) 70 - 99 (mg/dL)    Dg Chest 2 View  16/01/9603  *RADIOLOGY REPORT*  Clinical Data: Chest pain, shortness of breath  CHEST - 2 VIEW  Comparison: 03/15/2011  Findings: Cardiomediastinal silhouette is stable.  Central mild vascular congestion without convincing pulmonary edema.  Stable bilateral basilar crowding of vascular markings.  Stable mild degenerative changes thoracic spine.  No segmental infiltrate.  IMPRESSION:  Central mild vascular congestion without convincing pulmonary edema.  Stable bilateral basilar crowding of vascular markings. No focal infiltrate.  Original Report Authenticated By: Natasha Mead, M.D.   Ct Angio Chest W/cm &/or Wo Cm  04/22/2011  *RADIOLOGY REPORT*  Clinical Data:  Chest pain radiating through to the back. Shortness of breath.  Nausea.  CT ANGIOGRAPHY CHEST WITH CONTRAST  Technique:  Multidetector CT imaging of the chest was performed using the standard protocol during bolus administration of intravenous contrast.  Multiplanar CT image reconstructions including MIPs were obtained to evaluate the vascular anatomy.  Contrast: OMNIPAQUE IOHEXOL 350 MG/ML IV SOLN  Comparison:  Prior CT angiogram of the chest from last month; also chest radiograph from 04/22/2011  Findings:  Technical factors related to patient body habitus reduce diagnostic sensitivity and specificity.  No filling defect is identified in the pulmonary arterial tree to suggest pulmonary embolus.  Mild cardiomegaly noted.  A low density anterior mediastinal node measures 2.4 cm in short axis on image 22 of series 6 (formerly 2.6 cm).  Another anterior mediastinal node measures 1.4 cm in short axis (formerly 1.5 cm.).  Similar findings have been noted back through the CT chest from 2002.  Comparing back to the exam from 02/19/2003, the lesions have mildly increased in size in the intervening 8 years.  Diffuse hepatic steatosis noted.  Review of the MIP images confirms the above  findings.  IMPRESSION:  1.  No embolus is observed 2.  Stable anterior mediastinal nodes have been noted since 2002 and have increased slightly in size compared to 2004.  Differential diagnostic considerations include chronic adenopathy, germ cell tumor, or thymoma, but the minimal increase in size over 8 years argues against an aggressive process. 3.  Mild cardiomegaly. 4.  Diffuse hepatic steatosis.  Original Report Authenticated By: Dellia Cloud, M.D.    Review of Systems  Constitutional: Negative.   HENT: Negative.   Eyes: Negative.   Respiratory: Negative.   Gastrointestinal: Negative.   Genitourinary: Negative.   Musculoskeletal:       Chest pain worsens with touch, breathing and movement  Skin: Negative.   Neurological: Negative.   Endo/Heme/Allergies: Negative.   Psychiatric/Behavioral: Negative.     Blood pressure 158/95, pulse 78, temperature 98.3 F (36.8 C), temperature source Oral, resp. rate 20, SpO2 95.00%. Physical Exam  Constitutional: She is oriented to person, place, and time.  No distress.       Morbidly obese  HENT:  Head: Normocephalic.  Mouth/Throat: Oropharynx is clear and moist.  Eyes: Conjunctivae are normal. Pupils are equal, round, and reactive to light. No scleral icterus.  Neck: Normal range of motion. Neck supple. No JVD present.  Cardiovascular: Normal rate, regular rhythm, normal heart sounds and intact distal pulses.  Exam reveals no gallop and no friction rub.   No murmur heard. Respiratory: Effort normal and breath sounds normal. She has no wheezes. She has no rales. She exhibits tenderness.  GI: Soft. Bowel sounds are normal. There is no tenderness.  Genitourinary:       deferred  Musculoskeletal: Normal range of motion.       Chest tenderness elicited.  Lymphadenopathy:    She has no cervical adenopathy.  Neurological: She is alert and oriented to person, place, and time.  Skin: Skin is warm and dry. No rash noted. She is not  diaphoretic. No erythema.  Psychiatric: She has a normal mood and affect. Her behavior is normal.     Assessment/Plan Non cardiac chest pain most likely costochondritis Mediastinal mass ? Sarcoidosis ? Thymoma   Patient is a 50 yr old woman with risk factors for CAD who has been having recurrent atypical chest pain in the setting of known normal coronaries at least from 2008. The pain she describes today is consistent with costochondritis which is what I will be treating her for. However, she has presented in the past with atypical chest pain and though she had a normal cath 4 yrs ago, she does have risk factors for CAD. She was being prepared for an out patient stress test and I wondered if we could have that done as an inpatient now that she is here. If logistics do not permit this to be done, she can still follow up as an out patient for that stress test. I do not think she can walk to any great extent on a treadmill so a dobutamine stress test may be more appropriate. I will continue her home meds and treat her with Ibuprofen 600 mg po three times daily round the clock.   Grandville Silos 04/23/2011, 2:44 AM

## 2011-04-23 NOTE — Discharge Summary (Signed)
Discharge Summary   Patient ID: [name] MRN: [id number], DOB/AGE: 05/15/1960 50 y.o. Admit date: 04/22/2011 D/C date:     04/23/2011    Primary Care Physician:  Dala Dock Primary Cardiologist:  Dr. Charlton Haws  Primary Electrophysiologist:  None   Reason for Admission:  Chest pain  Primary Discharge Diagnoses:  1. Chest pain, likely musculoskeletal in etiology 1. Low risk myoview; consider coronary CT angio as outpatient if symptoms persist. 2. Mediastinal adenopathy -  Needs follow up with PCP  Secondary Discharge Diagnoses:   Past Medical History  Diagnosis Date  . Hypertension   . Diabetes mellitus   . Asthma   . Anginal pain   . Depression   . Anxiety   . Pulmonary embolism      Procedures Performed This Admission:  Lexiscan Myoview  Nm Myocar Multi W/spect W/wall Motion / Ef  04/23/2011    IMPRESSION: Subtle area of decreased uptake along the mid anterior wall on the stress images.  There is normal wall motion in the anterior wall. This finding could be related to breast attenuation on the stress images but cannot exclude a small area of reversibility.  No other areas are suspicious for pharmacologically induced ischemia.  Normal wall motion with ejection fraction of 65%.  Original Report Authenticated By: Richarda Overlie, M.D.    Hospital Course: [name] is a 50 y.o. female with a h/o DM, HTN, depression and prior pulmonary embolism who was admitted with atypical chest pain.  Cardiac markers negative.  DDimer elevated.  CTA of chest neg for pulmonary embolism.   There were some mediastinal lymph nodes noted with minimal change from 2004.  She will need follow up with her PCP to further evaluate this.  Inpatient myoview performed and results noted above.  She had a mild perfusion defect in the anterior wall with normal wall motion, suspicious for breast attenuation but mild ischemia could not be ruled out.  Discussed with Dr. Marca Ancona.  He felt this  was low risk and she could be discharge to home.  Her pain is atypical for ischemia.  Cardiac markers are negative.  She has follow up with Dr. Charlton Haws already and will keep that appointment.  Dr. Marca Ancona suggested she have a coronary CT angio if her symptoms persist.  Dr. Marca Ancona felt she was stable for discharge to home on her current medications.     Discharge Vitals: Blood pressure 170/96, pulse 84, temperature 97.7 F (36.5 C), temperature source Oral, resp. rate 22, height 4\' 11"  (1.499 m), weight 235 lb 11.2 oz (106.913 kg), SpO2 97.00%.  Labs: Lab Results  Component Value Date   WBC 6.0 04/23/2011   HGB 12.3 04/23/2011   HCT 36.8 04/23/2011   MCV 87.8 04/23/2011   PLT 193 04/23/2011     Lab 04/23/11 0830  NA 140  K 3.6  CL 102  CO2 28  BUN 10  CREATININE 0.87  CALCIUM 9.1  PROT --  BILITOT --  ALKPHOS --  ALT --  AST --  GLUCOSE 107*    Basename 04/23/11 0830 04/22/11 1556  CKTOTAL 75 103  CKMB 2.3 2.4  TROPONINI <0.30 <0.30   Lab Results  Component Value Date   CHOL 173 04/23/2011   CHOL 171 04/23/2011   HDL 41 04/23/2011   HDL 40 04/23/2011   LDLCALC 103* 04/23/2011   LDLCALC 102* 04/23/2011   TRIG 147 04/23/2011   TRIG 143 04/23/2011   Lab Results  Component Value Date   DDIMER 0.63* 04/22/2011   Lab Results  Component Value Date   TSH 0.681 08/06/2007    Diagnostic Studies/Procedures:  Dg Chest 2 View  04/22/2011  IMPRESSION:  Central mild vascular congestion without convincing pulmonary edema.  Stable bilateral basilar crowding of vascular markings. No focal infiltrate.  Original Report Authenticated By: Natasha Mead, M.D.   Ct Angio Chest W/cm &/or Wo Cm  04/22/2011   IMPRESSION:  1.  No embolus is observed 2.  Stable anterior mediastinal nodes have been noted since 2002 and have increased slightly in size compared to 2004.  Differential diagnostic considerations include chronic adenopathy, germ cell tumor, or thymoma, but the  minimal increase in size over 8 years argues against an aggressive process. 3.  Mild cardiomegaly. 4.  Diffuse hepatic steatosis.  Original Report Authenticated By: Dellia Cloud, M.D.     Discharge Medications   Current Discharge Medication List    START taking these medications   Details  nitroGLYCERIN (NITROSTAT) 0.4 MG SL tablet Place 1 tablet (0.4 mg total) under the tongue every 5 (five) minutes as needed for chest pain. Qty: 25 tablet, Refills: 11      CONTINUE these medications which have NOT CHANGED   Details  albuterol (PROVENTIL,VENTOLIN) 90 MCG/ACT inhaler Inhale 2 puffs into the lungs 3 (three) times daily as needed. For shortness of breath     aspirin 81 MG tablet Take 81 mg by mouth daily.      atorvastatin (LIPITOR) 20 MG tablet Take 20 mg by mouth daily.      cloNIDine (CATAPRES) 0.1 MG tablet Take 0.1 mg by mouth 2 (two) times daily.      cyclobenzaprine (FLEXERIL) 10 MG tablet Take 10 mg by mouth daily.      diazepam (VALIUM) 5 MG tablet Take 5 mg by mouth at bedtime.      ibuprofen (ADVIL,MOTRIN) 400 MG tablet Take 400 mg by mouth every 6 (six) hours as needed. For pain    isosorbide dinitrate (ISORDIL) 20 MG tablet Take 10 mg by mouth 2 (two) times daily.      lisinopril-hydrochlorothiazide (PRINZIDE,ZESTORETIC) 20-12.5 MG per tablet Take 1 tablet by mouth daily.      metFORMIN (GLUCOPHAGE) 500 MG tablet Take 1,000 mg by mouth 2 (two) times daily.     Multiple Vitamins-Minerals (MULTIVITAMIN WITH MINERALS) tablet Take 1 tablet by mouth daily.      omeprazole (PRILOSEC) 20 MG capsule Take 20 mg by mouth daily.      sertraline (ZOLOFT) 100 MG tablet Take 100 mg by mouth daily.     traMADol (ULTRAM) 50 MG tablet Take 50 mg by mouth every 6 (six) hours as needed. For pain, Maximum dose= 8 tablets per day      STOP taking these medications     nabumetone (RELAFEN) 500 MG tablet         Disposition   The patient will be discharged in stable  condition to home. Discharge Orders    Future Appointments: Provider: Department: Dept Phone: Center:   04/26/2011 8:15 AM [facility]  None   04/26/2011 10:30 AM Sherriann Nelma [facility]  None   05/05/2011 11:00 AM [facility] [phone]   05/09/2011 9:00 AM Wendall Stade, MD [facility] [phone] [id number]     Future Orders Please Complete By Expires   Diet - low sodium heart healthy  Diet Carb Modified      Increase activity slowly        Follow-up Information    Follow up with [facility], MD. Make an appointment in 2 weeks.   Contact information:   [address] [place] [address] [phone]       Follow up with Charlton Haws, MD. (keep scheduled appointment in January 2013)    Contact information:   [address], [address] [place] [address] [phone]            Duration of Discharge Encounter: Greater than 30 minutes including physician and PA time.  Signed, Tereso Newcomer, PA-C  3:28 PM 04/23/2011        [address] [place], [place]  [address] Phone: [phone] Fax:  [phone]

## 2011-04-23 NOTE — ED Notes (Signed)
PT DENIES ALL C.O'S CP AT PRESENT AFTER 3RD NTG.

## 2011-04-26 ENCOUNTER — Ambulatory Visit (HOSPITAL_COMMUNITY): Payer: PRIVATE HEALTH INSURANCE | Attending: Cardiology

## 2011-04-26 ENCOUNTER — Encounter (HOSPITAL_COMMUNITY): Payer: Medicare Other | Admitting: Radiology

## 2011-04-26 DIAGNOSIS — J449 Chronic obstructive pulmonary disease, unspecified: Secondary | ICD-10-CM | POA: Insufficient documentation

## 2011-04-26 DIAGNOSIS — R0989 Other specified symptoms and signs involving the circulatory and respiratory systems: Secondary | ICD-10-CM | POA: Insufficient documentation

## 2011-04-26 DIAGNOSIS — J4489 Other specified chronic obstructive pulmonary disease: Secondary | ICD-10-CM | POA: Insufficient documentation

## 2011-04-26 DIAGNOSIS — E785 Hyperlipidemia, unspecified: Secondary | ICD-10-CM | POA: Insufficient documentation

## 2011-04-26 DIAGNOSIS — R0609 Other forms of dyspnea: Secondary | ICD-10-CM | POA: Insufficient documentation

## 2011-04-26 DIAGNOSIS — I10 Essential (primary) hypertension: Secondary | ICD-10-CM | POA: Insufficient documentation

## 2011-04-26 DIAGNOSIS — R0600 Dyspnea, unspecified: Secondary | ICD-10-CM

## 2011-05-05 ENCOUNTER — Ambulatory Visit (HOSPITAL_COMMUNITY): Payer: Medicare Other

## 2011-05-09 ENCOUNTER — Encounter: Payer: Self-pay | Admitting: Cardiovascular Disease

## 2011-05-09 ENCOUNTER — Ambulatory Visit (INDEPENDENT_AMBULATORY_CARE_PROVIDER_SITE_OTHER): Payer: Medicare Other | Admitting: Cardiovascular Disease

## 2011-05-09 DIAGNOSIS — E7800 Pure hypercholesterolemia, unspecified: Secondary | ICD-10-CM

## 2011-05-09 DIAGNOSIS — R079 Chest pain, unspecified: Secondary | ICD-10-CM | POA: Insufficient documentation

## 2011-05-09 DIAGNOSIS — J449 Chronic obstructive pulmonary disease, unspecified: Secondary | ICD-10-CM

## 2011-05-09 NOTE — Patient Instructions (Signed)
Your physician recommends that you schedule a follow-up appointment in: AS NEEDED  Your physician recommends that you continue on your current medications as directed. Please refer to the Current Medication list given to you today.  

## 2011-05-09 NOTE — Progress Notes (Signed)
Patient ID: [name], female   DOB: Sep 21, 1960, 51 y.o.   MRN: [id number] 51 yo added on to my schedule for SSCP. Previously seen by Dr [place] with normal heart cath in 2008. Recently consulted on by Dr Jens Som 2/12 for SSCP. Past medical history of diet- controlled diabetes mellitus but started on pills a cuple of months ago, hypertension, obstructive sleep apnea who  I am asked to evaluate for chest pain. The patient did undergo cardiac catheterization in December 2008. At that time, she was found to have  no coronary artery disease and normal LV function. She apparently has had intermittent chest pain for years. She was treated in January 2008  with Coumadin for 6 months for a question of pulmonary embolus on CT. She did have her last CTA in October 2011, which showed no pulmonary  Embolus.  Recent URI with worsening dyspnea. Mild chronic LE edema. Atypica sharp pains in back and shoulder. Sedentary. Quit smoking many years ago  No fever or cough. No hemoptysis. NO change in LE edema. Compliant with meds but not always diet  Admitted to hospital during holidays.  Myovue done and normal  Discussed obesity and exercise program  Patient will try to do something at the [facility]  ROS: Denies fever, malais, weight loss, blurry vision, decreased visual acuity, cough, sputum, SOB, hemoptysis, pleuritic pain, palpitaitons, heartburn, abdominal pain, melena, lower extremity edema, claudication, or rash.  All other systems reviewed and negative  General: Affect appropriate Obese black female HEENT: normal Neck supple with no adenopathy JVP normal no bruits no thyromegaly Lungs clear with no wheezing and good diaphragmatic motion Heart:  S1/S2 no murmur,rub, gallop or click PMI normal Abdomen: benighn, BS positve, no tenderness, no AAA no bruit.  No HSM or HJR Distal pulses intact with no bruits No edema Neuro non-focal Skin warm and dry No muscular weakness   Current Outpatient  Prescriptions  Medication Sig Dispense Refill  . albuterol (PROVENTIL,VENTOLIN) 90 MCG/ACT inhaler Inhale 2 puffs into the lungs 3 (three) times daily as needed. For shortness of breath       . aspirin 81 MG tablet Take 81 mg by mouth daily.        [name] atorvastatin (LIPITOR) 20 MG tablet Take 20 mg by mouth daily.        . cetirizine (ZYRTEC) 10 MG tablet Take 10 mg by mouth daily.      . cloNIDine (CATAPRES) 0.1 MG tablet Take 0.1 mg by mouth 2 (two) times daily.        . cyclobenzaprine (FLEXERIL) 10 MG tablet Take 10 mg by mouth daily.        . diazepam (VALIUM) 5 MG tablet Take 5 mg by mouth at bedtime.        [name] ibuprofen (ADVIL,MOTRIN) 400 MG tablet Take 400 mg by mouth every 6 (six) hours as needed. For pain      . isosorbide dinitrate (ISORDIL) 20 MG tablet Take 10 mg by mouth 2 (two) times daily.        [name] lisinopril-hydrochlorothiazide (PRINZIDE,ZESTORETIC) 20-12.5 MG per tablet Take 1 tablet by mouth daily.        . metFORMIN (GLUCOPHAGE) 500 MG tablet Take 1,000 mg by mouth 2 (two) times daily.       . Multiple Vitamins-Minerals (MULTIVITAMIN WITH MINERALS) tablet Take 1 tablet by mouth daily.        . nitroGLYCERIN (NITROSTAT) 0.4 MG SL tablet Place 1 tablet (0.4 mg total) under the  tongue every 5 (five) minutes as needed for chest pain.  25 tablet  11  . omeprazole (PRILOSEC) 20 MG capsule Take 20 mg by mouth daily.        . potassium chloride (K-DUR,KLOR-CON) 10 MEQ tablet Take 10 mEq by mouth 2 (two) times daily.      . sertraline (ZOLOFT) 100 MG tablet Take 100 mg by mouth daily.       . traMADol (ULTRAM) 50 MG tablet Take 50 mg by mouth every 6 (six) hours as needed. For pain, Maximum dose= 8 tablets per day        Allergies  Penicillins and Sulfonamide derivatives  Electrocardiogram:  Assessment and Plan

## 2011-05-09 NOTE — Assessment & Plan Note (Signed)
SOB not related to cardiac disease.  Related to COPD and obesity  F/U [facility]

## 2011-05-09 NOTE — Assessment & Plan Note (Signed)
Cholesterol is at goal.  Continue current dose of statin and diet Rx.  No myalgias or side effects.  F/U  LFT's in 6 months. Lab Results  Component Value Date   LDLCALC 103* 04/23/2011   LDLCALC 102* 04/23/2011

## 2011-05-09 NOTE — Assessment & Plan Note (Signed)
Normal cath in 2008 by [place].  Normal myovue 12/12  Noncardiac.  No need for further cardiac F/U.  If she needs reevaluation this should be with Dr Jens Som !

## 2011-05-17 ENCOUNTER — Ambulatory Visit: Payer: Medicare Other | Admitting: Cardiology

## 2011-05-29 ENCOUNTER — Institutional Professional Consult (permissible substitution): Payer: Medicare Other | Admitting: Pulmonary Disease

## 2011-06-01 ENCOUNTER — Ambulatory Visit (HOSPITAL_COMMUNITY): Payer: Medicare Other

## 2011-06-02 ENCOUNTER — Ambulatory Visit (HOSPITAL_COMMUNITY)
Admission: RE | Admit: 2011-06-02 | Discharge: 2011-06-02 | Disposition: A | Discharge status: Home / Self Care | Payer: Medicare Other | Source: Ambulatory Visit | Attending: Family Medicine | Admitting: Family Medicine

## 2011-06-02 DIAGNOSIS — Z1231 Encounter for screening mammogram for malignant neoplasm of breast: Secondary | ICD-10-CM | POA: Insufficient documentation

## 2011-06-05 ENCOUNTER — Other Ambulatory Visit (HOSPITAL_COMMUNITY): Payer: Self-pay | Admitting: Family Medicine

## 2011-06-05 DIAGNOSIS — I10 Essential (primary) hypertension: Secondary | ICD-10-CM

## 2011-06-06 ENCOUNTER — Ambulatory Visit (HOSPITAL_COMMUNITY)
Admission: RE | Admit: 2011-06-06 | Discharge: 2011-06-06 | Disposition: A | Discharge status: Home / Self Care | Payer: PRIVATE HEALTH INSURANCE | Source: Ambulatory Visit | Attending: Family Medicine | Admitting: Family Medicine

## 2011-06-06 ENCOUNTER — Other Ambulatory Visit (HOSPITAL_COMMUNITY): Payer: Self-pay | Admitting: Family Medicine

## 2011-06-06 DIAGNOSIS — R079 Chest pain, unspecified: Secondary | ICD-10-CM | POA: Insufficient documentation

## 2011-06-06 DIAGNOSIS — I10 Essential (primary) hypertension: Secondary | ICD-10-CM

## 2011-06-06 MED ORDER — IOHEXOL 350 MG/ML SOLN
100.0000 mL | Freq: Once | INTRAVENOUS | Status: AC | PRN
  Administered 2011-06-06: 100 mL via INTRAVENOUS

## 2011-06-20 ENCOUNTER — Encounter: Payer: Self-pay | Admitting: Pulmonary Disease

## 2011-06-20 ENCOUNTER — Ambulatory Visit (INDEPENDENT_AMBULATORY_CARE_PROVIDER_SITE_OTHER): Payer: Medicare Other | Admitting: Pulmonary Disease

## 2011-06-20 DIAGNOSIS — R0609 Other forms of dyspnea: Secondary | ICD-10-CM

## 2011-06-20 DIAGNOSIS — R0989 Other specified symptoms and signs involving the circulatory and respiratory systems: Secondary | ICD-10-CM

## 2011-06-20 DIAGNOSIS — R0600 Dyspnea, unspecified: Secondary | ICD-10-CM

## 2011-06-20 DIAGNOSIS — J9859 Other diseases of mediastinum, not elsewhere classified: Secondary | ICD-10-CM

## 2011-06-20 DIAGNOSIS — D4989 Neoplasm of unspecified behavior of other specified sites: Secondary | ICD-10-CM | POA: Insufficient documentation

## 2011-06-20 DIAGNOSIS — R0602 Shortness of breath: Secondary | ICD-10-CM | POA: Insufficient documentation

## 2011-06-20 IMAGING — CR DG CHEST 2V
2 series · 2 of 2 positions shown · non-contrast
Comparison: 05/08/2009

CLINICAL DATA: shortness of breath and chest pain

CHEST - 2 VIEW

[w chest pa]
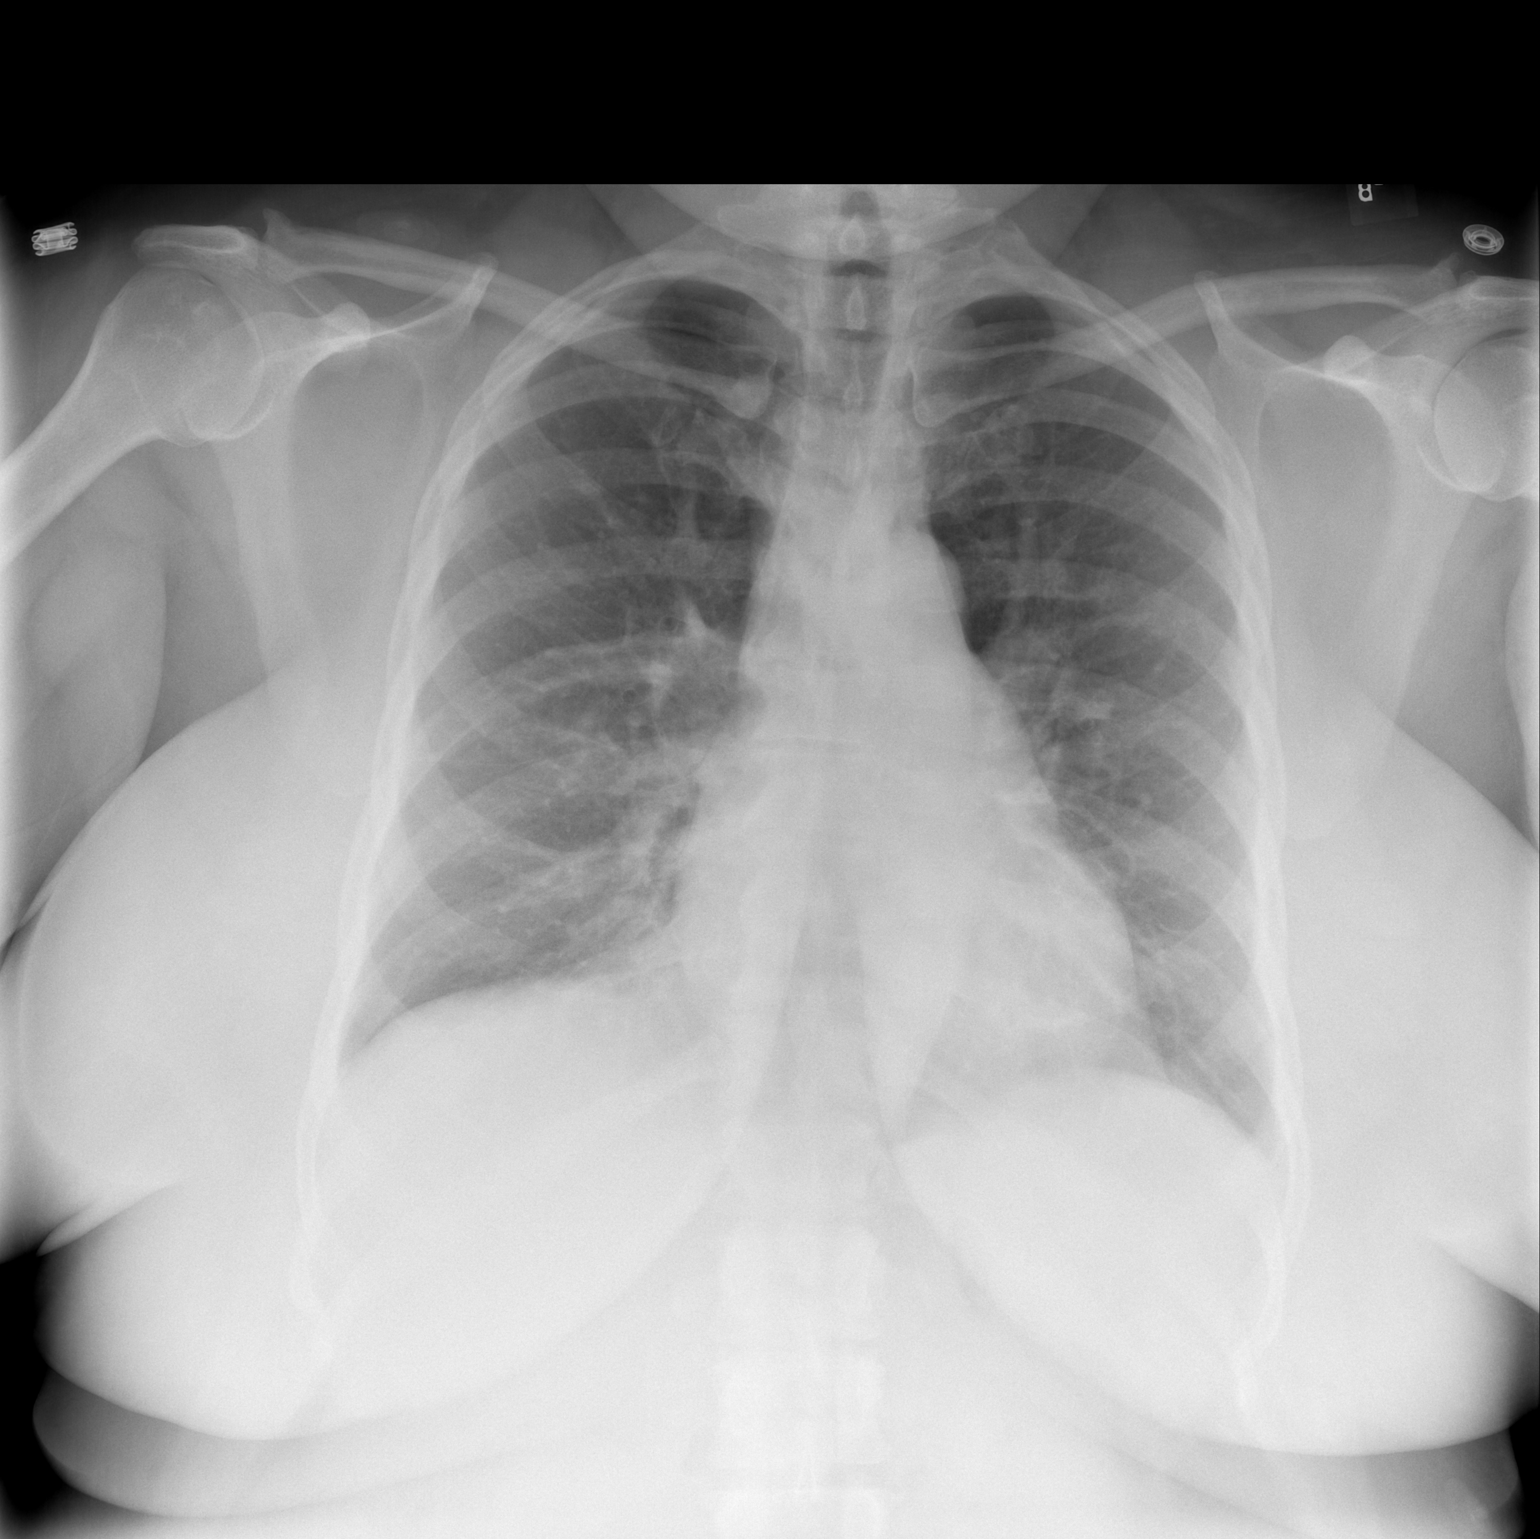

[w chest lat]
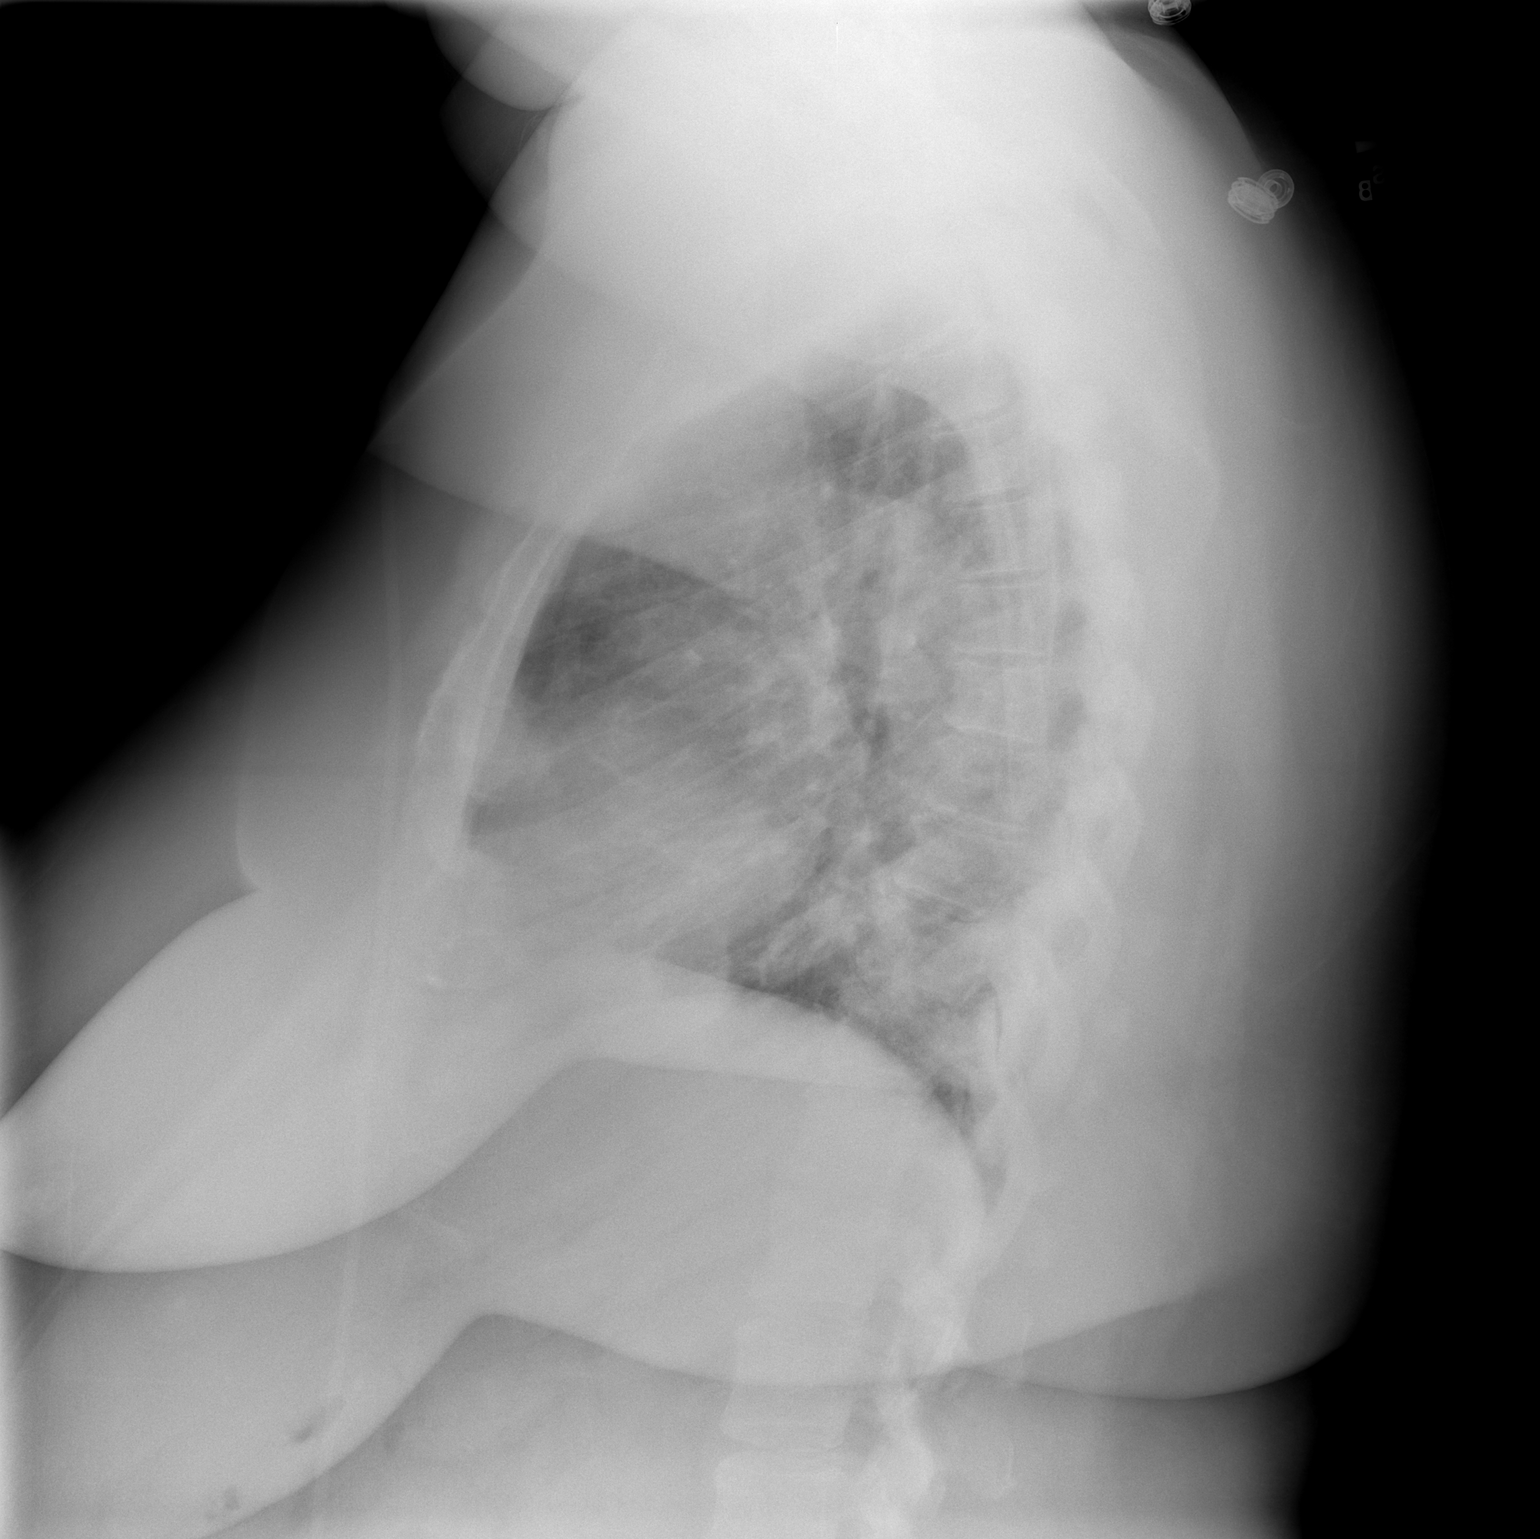

[2 of 2 positions shown; findings below may reference images not displayed]

FINDINGS: Heart size is normal.

No pleural effusion or pulmonary edema.

There is no airspace consolidation identified.

Mild thoracic spondylosis is noted.
IMPRESSION: 1.  No active cardiopulmonary disease.

## 2011-06-20 NOTE — Assessment & Plan Note (Signed)
The patient has anterior mediastinal nodules that have been present for many years, with very little change on CT chest.  She has no other mediastinal or hilar lymphadenopathy noted.  No further followup is required.

## 2011-06-20 NOTE — Progress Notes (Signed)
Addended by: Michel Bickers A on: 06/20/2011 03:09 PM   Modules accepted: Orders

## 2011-06-20 NOTE — Patient Instructions (Signed)
Will schedule for breathing tests, and see you back same day to review with you. Work on weight loss and conditioning.

## 2011-06-20 NOTE — Assessment & Plan Note (Signed)
The patient has significant dyspnea on exertion that I suspect is related to centripetal obesity and deconditioning.  She has had an echocardiogram that is suggestive of diastolic dysfunction, and this would not be unusual given her poorly controlled hypertension.  There is nothing by exam or on her chest CT that would suggest a cause for her shortness of breath.  She does have atelectasis in the bases on her CT that is most certainly related to her obesity.  She does have a history of pulmonary emboli, but nothing on her echo to suggest significant pulmonary hypertension.  I will schedule her for full PFTs, primarily to evaluate for airway disease.  She will followup on the same day for review.  I have encouraged her to work aggressively on weight loss and also deconditioning.

## 2011-06-20 NOTE — Progress Notes (Signed)
  Subjective:    Patient ID: [name], female    DOB: 1960-05-24, 51 y.o.   MRN: [id number]  HPI The patient is a 51 year old female who I've been asked to see for dyspnea.  The patient states that she has been short of breath off and on for years, and thinks over the last one year it may be getting worse.  She describes a 2 block dyspnea on exertion at a moderate pace on flat ground.  She will get winded bringing groceries in from the car, and also with housework.  The patient has no significant cough or chest congestion, but tells me that she has a history of "asthma".  She also has intermittent lower extremity edema.  Her weight has been up and down, but believes that it is neutral over the last few years.  The patient has had a recent echo in January that showed normal LV function, but did suggest diastolic dysfunction.  She had a normal right ventricle, and nothing to suggest significant pulmonary hypertension.  The patient also has a long history of an anterior mediastinal soft tissue density, but the most recent CT chest showed no significant change.  There were no other findings in the chest that may be related to shortness of breath.   Review of Systems  Constitutional: Positive for unexpected weight change. Negative for fever.  HENT: Negative.  Negative for ear pain, nosebleeds, congestion, sore throat, rhinorrhea, sneezing, trouble swallowing, dental problem, postnasal drip and sinus pressure.   Eyes: Negative.  Negative for redness and itching.  Respiratory: Positive for cough and shortness of breath. Negative for chest tightness and wheezing.   Cardiovascular: Positive for chest pain. Negative for palpitations and leg swelling.  Gastrointestinal: Negative.  Negative for nausea and vomiting.  Genitourinary: Negative.  Negative for dysuria.  Musculoskeletal: Positive for joint swelling and arthralgias.  Skin: Negative.  Negative for rash.  Neurological: Negative.  Negative for  headaches.  Hematological: Negative.  Does not bruise/bleed easily.  Psychiatric/Behavioral: Negative.  Negative for dysphoric mood. The patient is not nervous/anxious.        Objective:   Physical Exam Constitutional:  Obese female, no acute distress  HENT:  Nares patent without discharge  Oropharynx without exudate, palate and uvula are elongated.  Eyes:  Perrla, eomi, no scleral icterus  Neck:  No JVD, no TMG  Cardiovascular:  Normal rate, regular rhythm, no rubs or gallops.  No murmurs        Intact distal pulses  Pulmonary :  Decreased depth of inspiration, no stridor or respiratory distress   No rales, rhonchi, or wheezing  Abdominal:  Soft, nondistended, bowel sounds present.  No tenderness noted.   Musculoskeletal:  No significant lower extremity edema noted.  Lymph Nodes:  No cervical lymphadenopathy noted  Skin:  No cyanosis noted  Neurologic:  Alert, appropriate, moves all 4 extremities without obvious deficit.         Assessment & Plan:

## 2011-09-11 IMAGING — CT CT ANGIO CHEST
2 of 6 series · 19 of 36 positions shown · IV contrast (APPLIED)
Comparison: 05/08/2009 and earlier.

CLINICAL DATA: 49-year-old female with sternal pain radiating to
the back with shortness of breath, nausea.

CT ANGIOGRAPHY CHEST WITH CONTRAST
TECHNIQUE: Multidetector CT imaging of the chest was performed
using the standard protocol during bolus administration of
intravenous contrast.  Multiplanar CT image reconstructions
including MIPs were obtained to evaluate the vascular anatomy.
Contrast:  100 ml Omnipaque 350.

[Series 9: pulm embolism 1.0 b25f thins · axial · 0.63mm/px · z∈[+1150,+1356]mm · 18 of 229 slices shown]
[im 12/229  lung]
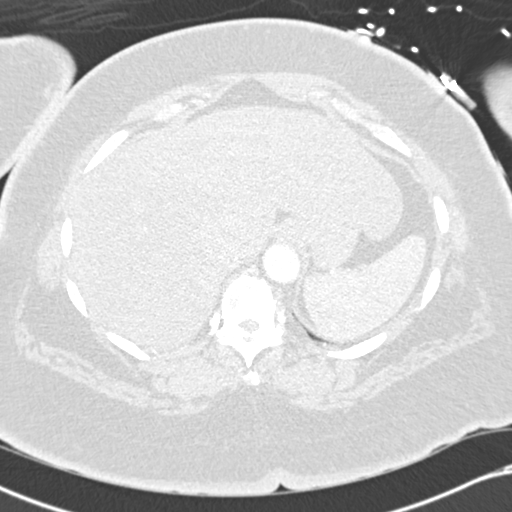
[im 23/229  mediastinal]
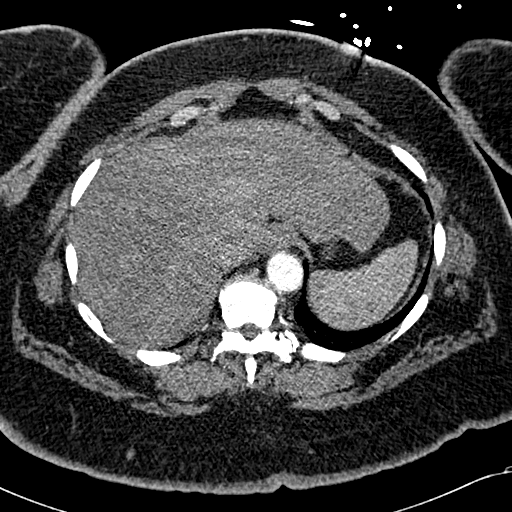
[im 35/229  lung]
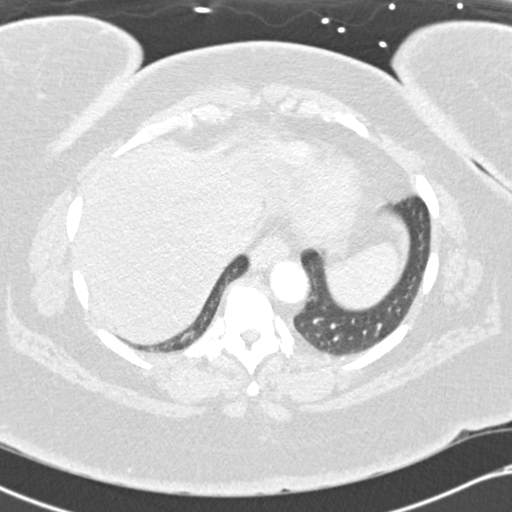
[im 46/229  mediastinal]
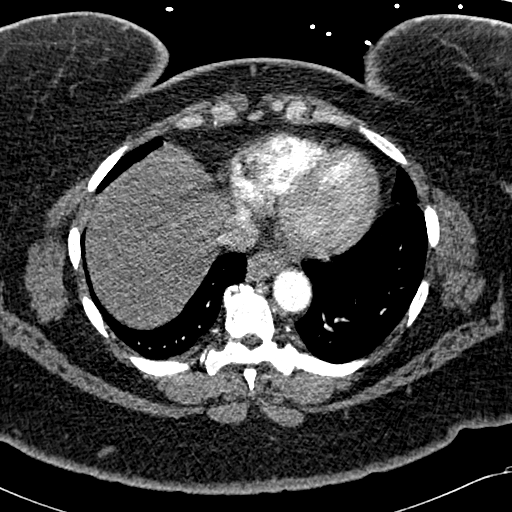
[im 58/229  lung]
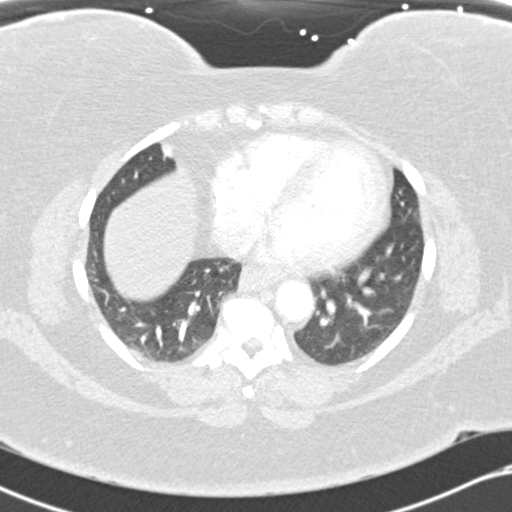
[im 69/229  mediastinal]
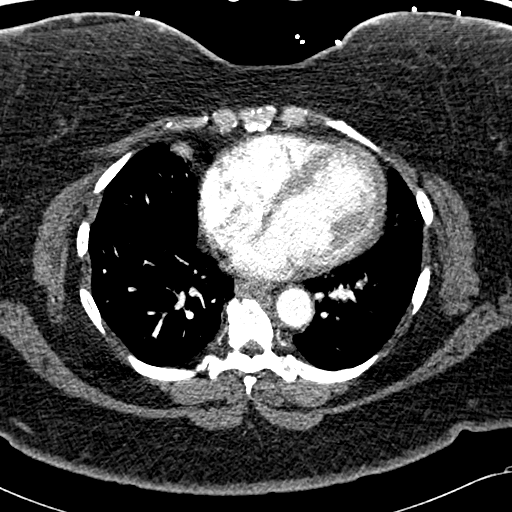
[im 80/229  lung]
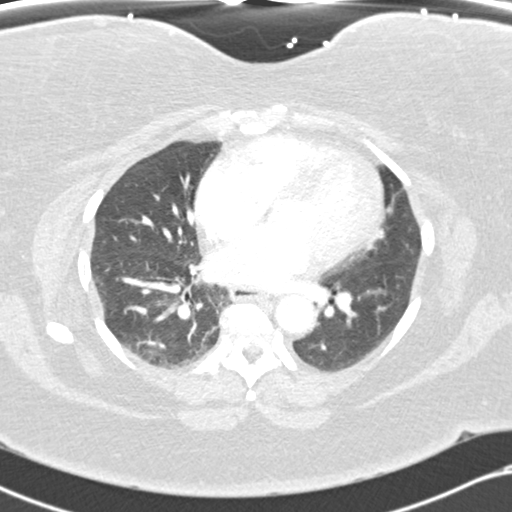
[im 92/229  mediastinal]
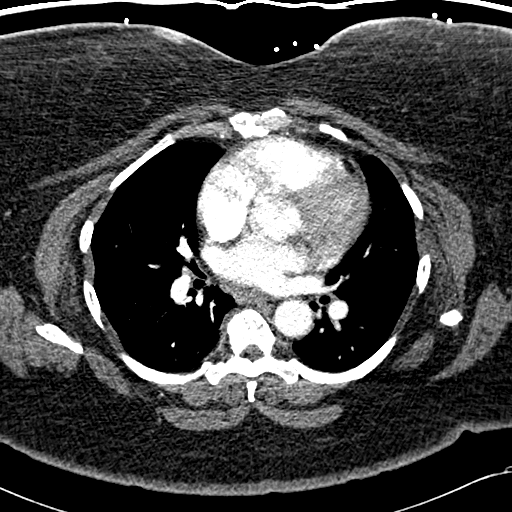
[im 103/229  lung]
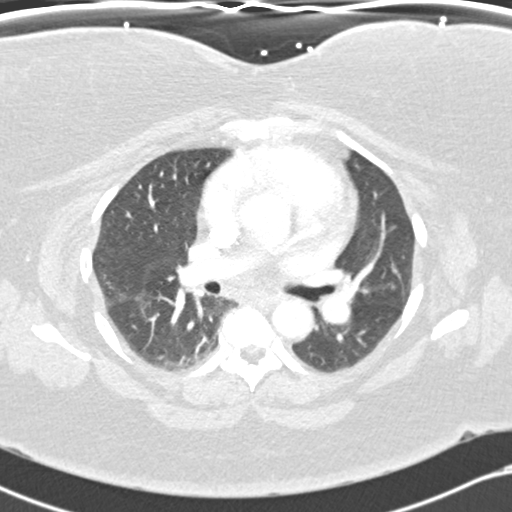
[im 126/229  mediastinal]
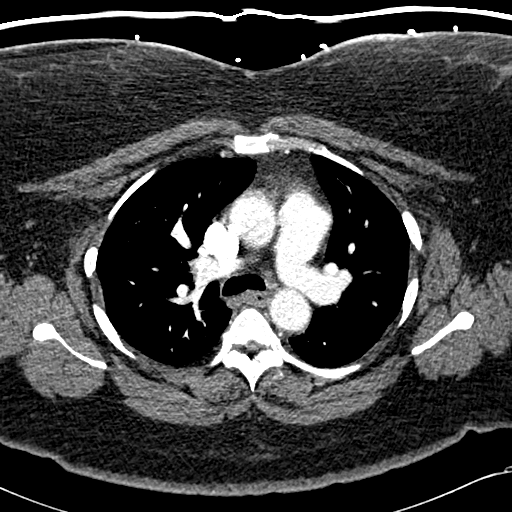
[im 137/229  lung]
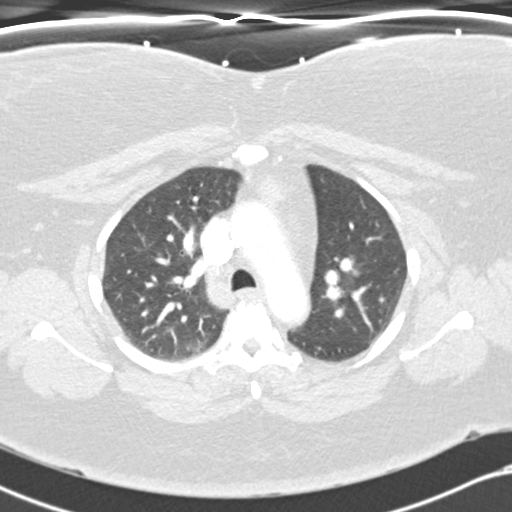
[im 149/229  mediastinal]
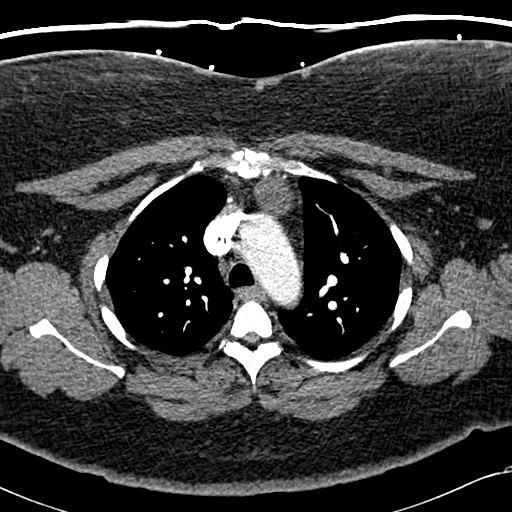
[im 160/229  lung]
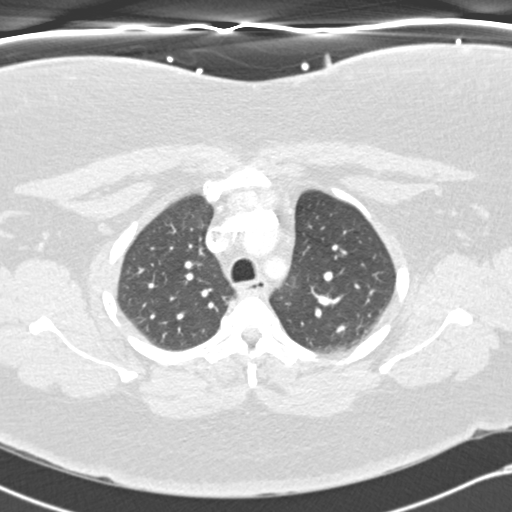
[im 172/229  mediastinal]
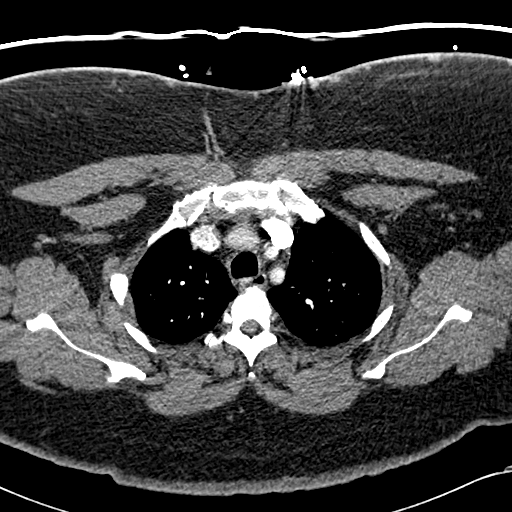
[im 183/229  lung]
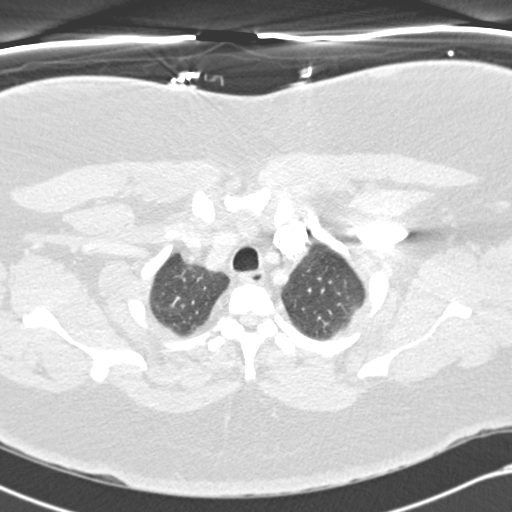
[im 194/229  mediastinal]
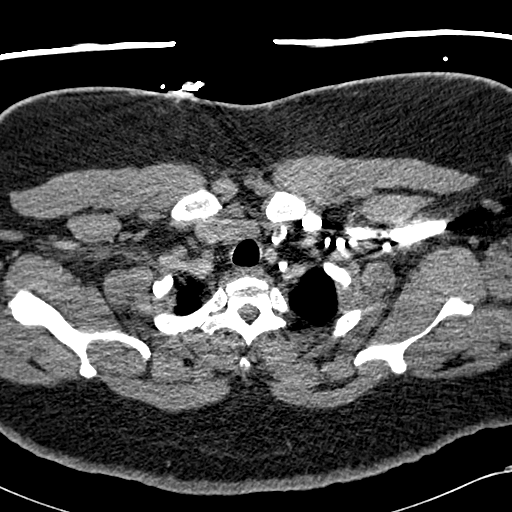
[im 206/229  lung]
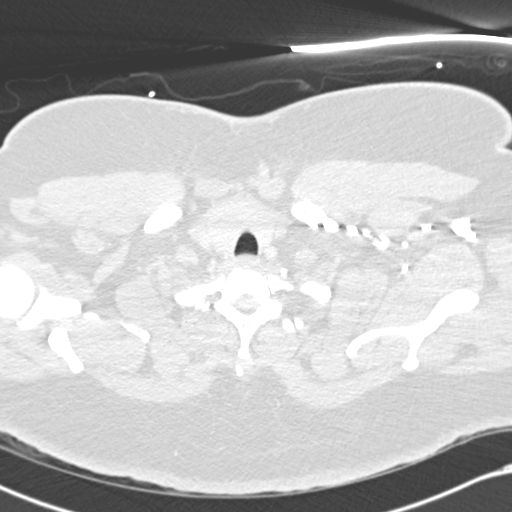
[im 217/229  mediastinal]
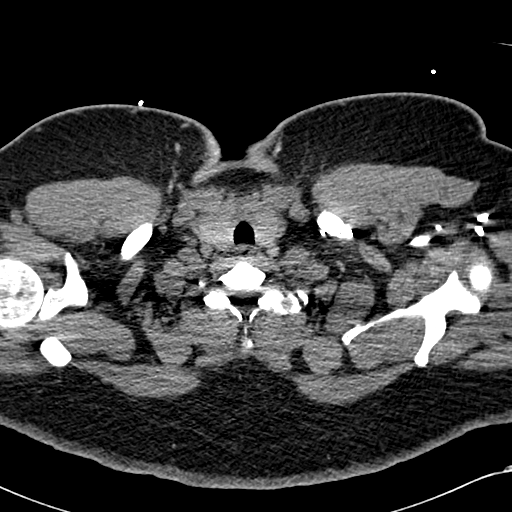

[Series 602: coronal · coronal · 0.63mm/px · 1 of 108 slices shown]
[im 54/108  mediastinal]
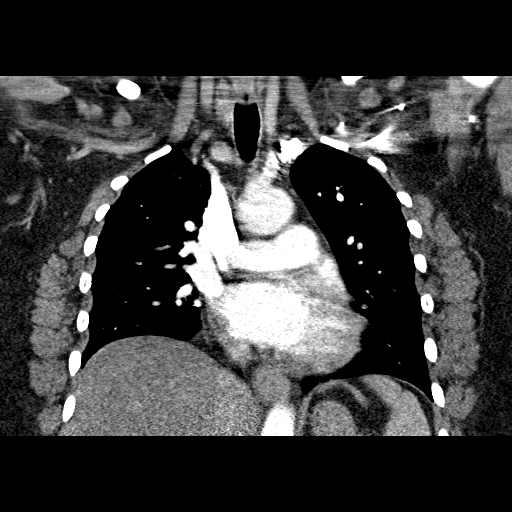

[19 of 36 positions shown; findings below may reference images not displayed]

FINDINGS: Adequate contrast bolus timing in the pulmonary arterial
tree.  Mild respiratory motion artifact at the lung bases, felt to
explain the appearance of the left anterior basal segment branches.
No focal filling defect identified in the pulmonary arterial tree
to suggest the presence of acute pulmonary embolism.

Mild atelectasis.  No pericardial or pleural effusion.  Major
airways are patent.  No acute airspace disease.

Normal visualized aorta.  Thyromegaly.  Chronic intermediate
density anterior mediastinal mass shows no significant change since
9779.  No mediastinal or hilar lymphadenopathy elsewhere.  Stable
visualized upper abdominal viscera.

No acute osseous abnormality identified.

Review of the MIP images confirms the above findings.
IMPRESSION: 1. No evidence of acute pulmonary embolus.
2.  No acute findings in the chest.  Chronic benign anterior
mediastinal lesion.

## 2011-09-11 IMAGING — CR DG CHEST 2V
2 series · 2 of 2 positions shown · non-contrast
Comparison: 08/15/2009.

CLINICAL DATA: Chest pain and bilateral arm and leg numbness.
Diabetes.  Controlled hypertension.

CHEST - 2 VIEW

[w chest pa]
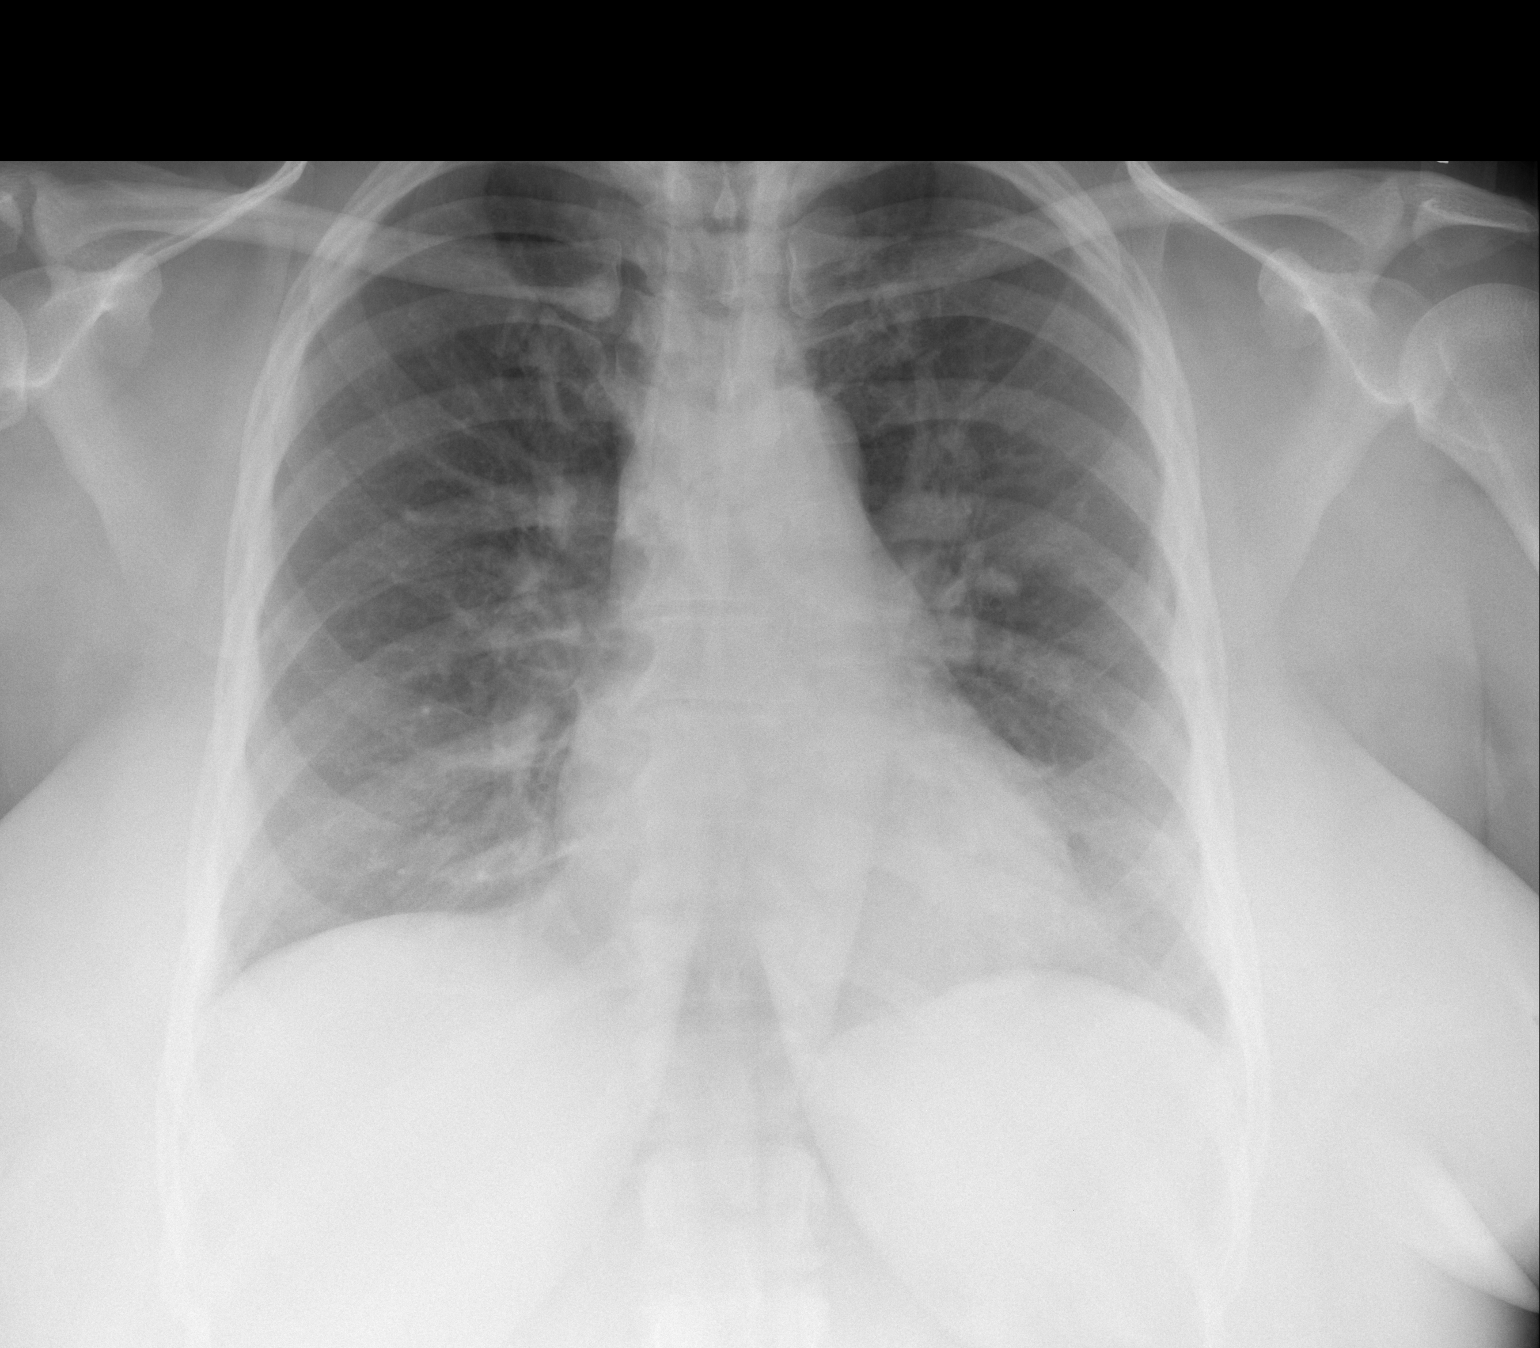

[w chest lat]
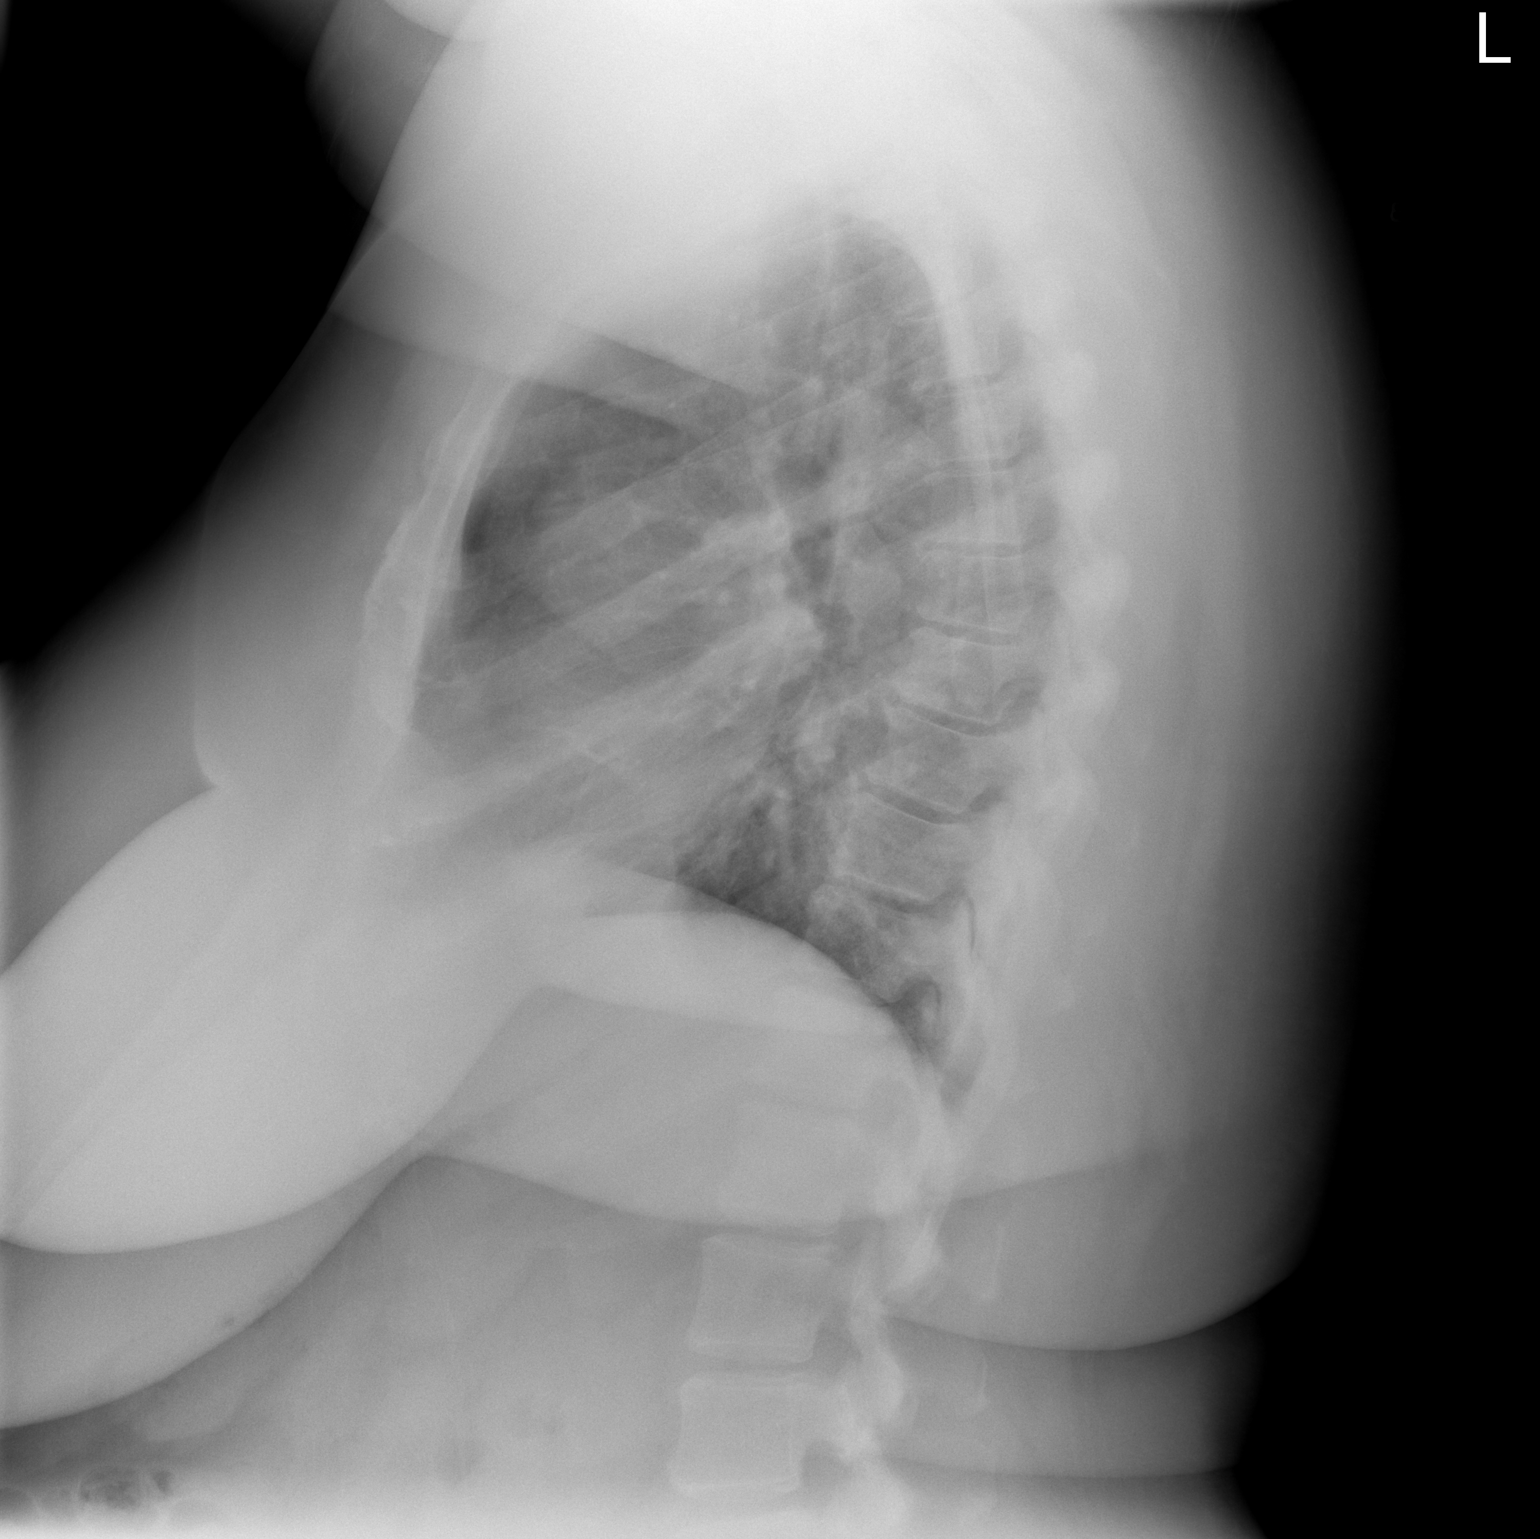

[2 of 2 positions shown; findings below may reference images not displayed]

FINDINGS: The heart remains normal in size.  Clear lungs.  Stable
mild diffuse peribronchial thickening.  Mild thoracic spine
degenerative changes.
IMPRESSION: Stable mild chronic bronchitic changes.  No acute abnormality.

## 2011-10-07 ENCOUNTER — Encounter (HOSPITAL_COMMUNITY): Payer: Self-pay | Admitting: *Deleted

## 2011-10-07 ENCOUNTER — Emergency Department (HOSPITAL_COMMUNITY)
Admission: EM | Admit: 2011-10-07 | Discharge: 2011-10-08 | Disposition: A | Discharge status: Home / Self Care | Payer: Medicare Other | Attending: Emergency Medicine | Admitting: Emergency Medicine

## 2011-10-07 DIAGNOSIS — I10 Essential (primary) hypertension: Secondary | ICD-10-CM | POA: Insufficient documentation

## 2011-10-07 DIAGNOSIS — E119 Type 2 diabetes mellitus without complications: Secondary | ICD-10-CM | POA: Insufficient documentation

## 2011-10-07 DIAGNOSIS — Z86711 Personal history of pulmonary embolism: Secondary | ICD-10-CM | POA: Insufficient documentation

## 2011-10-07 DIAGNOSIS — K529 Noninfective gastroenteritis and colitis, unspecified: Secondary | ICD-10-CM

## 2011-10-07 DIAGNOSIS — K5289 Other specified noninfective gastroenteritis and colitis: Secondary | ICD-10-CM | POA: Insufficient documentation

## 2011-10-07 DIAGNOSIS — Z87891 Personal history of nicotine dependence: Secondary | ICD-10-CM | POA: Insufficient documentation

## 2011-10-07 DIAGNOSIS — G4730 Sleep apnea, unspecified: Secondary | ICD-10-CM | POA: Insufficient documentation

## 2011-10-07 DIAGNOSIS — Z79899 Other long term (current) drug therapy: Secondary | ICD-10-CM | POA: Insufficient documentation

## 2011-10-07 LAB — CBC
HCT: 36.2 % (ref 36.0–46.0)
Hemoglobin: 12.2 g/dL (ref 12.0–15.0)
MCH: 29 pg (ref 26.0–34.0)
MCHC: 33.7 g/dL (ref 30.0–36.0)
MCV: 86.2 fL (ref 78.0–100.0)
Platelets: 250 10*3/uL (ref 150–400)
RBC: 4.2 MIL/uL (ref 3.87–5.11)
RDW: 13.4 % (ref 11.5–15.5)
WBC: 7.6 10*3/uL (ref 4.0–10.5)

## 2011-10-07 LAB — COMPREHENSIVE METABOLIC PANEL
ALT: 32 U/L (ref 0–35)
AST: 26 U/L (ref 0–37)
Albumin: 3.6 g/dL (ref 3.5–5.2)
Alkaline Phosphatase: 53 U/L (ref 39–117)
BUN: 11 mg/dL (ref 6–23)
CO2: 30 mEq/L (ref 19–32)
Calcium: 9.4 mg/dL (ref 8.4–10.5)
Chloride: 101 mEq/L (ref 96–112)
Creatinine, Ser: 0.98 mg/dL (ref 0.50–1.10)
GFR calc Af Amer: 76 mL/min — ABNORMAL LOW (ref >90)
GFR calc non Af Amer: 66 mL/min — ABNORMAL LOW (ref >90)
Glucose, Bld: 152 mg/dL — ABNORMAL HIGH (ref 70–99)
Potassium: 3.7 mEq/L (ref 3.5–5.1)
Sodium: 141 mEq/L (ref 135–145)
Total Bilirubin: 0.1 mg/dL — ABNORMAL LOW (ref 0.3–1.2)
Total Protein: 7 g/dL (ref 6.0–8.3)

## 2011-10-07 LAB — GLUCOSE, CAPILLARY: Glucose-Capillary: 112 mg/dL — ABNORMAL HIGH (ref 70–99)

## 2011-10-07 MED ORDER — ONDANSETRON 4 MG PO TBDP
8.0000 mg | ORAL_TABLET | Freq: Once | ORAL | Status: AC
  Administered 2011-10-08: 8 mg via ORAL
  Filled 2011-10-07: qty 2

## 2011-10-07 NOTE — ED Notes (Signed)
Patient with n/v/d for about three days and today her chest started hurting.  Patient also c/o headache and has been very sleepy.

## 2011-10-08 LAB — URINALYSIS, ROUTINE W REFLEX MICROSCOPIC
Bilirubin Urine: NEGATIVE
Glucose, UA: NEGATIVE mg/dL
Hgb urine dipstick: NEGATIVE
Ketones, ur: NEGATIVE mg/dL
Leukocytes, UA: NEGATIVE
Nitrite: NEGATIVE
Protein, ur: NEGATIVE mg/dL
Specific Gravity, Urine: 1.025 (ref 1.005–1.030)
Urobilinogen, UA: 0.2 mg/dL (ref 0.0–1.0)
pH: 5 (ref 5.0–8.0)

## 2011-10-08 MED ORDER — LOPERAMIDE HCL 2 MG PO CAPS
4.0000 mg | ORAL_CAPSULE | ORAL | Status: DC | PRN
  Administered 2011-10-08: 4 mg via ORAL
  Filled 2011-10-08: qty 2

## 2011-10-08 MED ORDER — LOPERAMIDE HCL 2 MG PO CAPS: 2.0000 mg | ORAL_CAPSULE | ORAL | Status: AC | PRN

## 2011-10-08 MED ORDER — SODIUM CHLORIDE 0.9 % IV BOLUS (SEPSIS)
1000.0000 mL | Freq: Once | INTRAVENOUS | Status: AC
  Administered 2011-10-08: 1000 mL via INTRAVENOUS

## 2011-10-08 NOTE — ED Provider Notes (Signed)
History     CSN: [id number]  Arrival date & time 10/07/11  2149   First MD Initiated Contact with Patient 10/07/11 2326      Chief Complaint  Patient presents with  . Diarrhea    (Consider location/radiation/quality/duration/timing/severity/associated sxs/prior treatment) HPI Comments: Patient is a 52 year old female with a history of hypertension, diabetes, asthma, and PE presents emergency department chief complaint of diarrhea.  Symptoms began about 3 days ago and patient reports having about 10 episodes a day.  Associated symptoms include emesis today x2, fatigue and headache.  Patient denies any abdominal pain, constipation, melena, hematochezia, hematemesis, urinary symptoms including frequency dysuria, syncope, chest pain, shortness of breath, fever, night sweats or chills.  Patient has no other complaints at this time.  Patient is a 51 y.o. female presenting with diarrhea. The history is provided by the patient.  Diarrhea The primary symptoms include diarrhea.    Past Medical History  Diagnosis Date  . Hypertension   . Diabetes mellitus   . Asthma   . Anginal pain   . Depression   . Anxiety   . Pulmonary embolism   . Sleep apnea     Past Surgical History  Procedure Date  . Abdominal hysterectomy 2005  . Leg surgery   . Cardiac catheterization     Normal  . Tubal ligation 1989  . Left knee surgery 2008    Family History  Problem Relation Age of Onset  . Emphysema Mother   . Asthma Brother   . Heart disease Paternal Grandfather   . Heart disease Father   . Arthritis Mother   . Colon cancer Maternal Grandfather     History  Substance Use Topics  . Smoking status: Former Smoker -- 0.5 packs/day for 15 years    Types: Cigarettes  . Smokeless tobacco: Not on file  . Alcohol Use: No    OB History    Grav Para Term Preterm Abortions TAB SAB Ect Mult Living                  Review of Systems  Gastrointestinal: Positive for diarrhea.  All other  systems reviewed and are negative.    Allergies  Penicillins and Sulfa antibiotics  Home Medications   Current Outpatient Rx  Name Route Sig Dispense Refill  . ALBUTEROL 90 MCG/ACT IN AERS Inhalation Inhale 2 puffs into the lungs 3 (three) times daily as needed. For shortness of breath     . ASPIRIN 81 MG PO TABS Oral Take 81 mg by mouth daily.      . ATORVASTATIN CALCIUM 20 MG PO TABS Oral Take 20 mg by mouth at bedtime.     [name] CETIRIZINE HCL 10 MG PO TABS Oral Take 10 mg by mouth daily.    [name] CLONIDINE HCL 0.1 MG PO TABS Oral Take 0.1 mg by mouth 3 (three) times daily.     . CYCLOBENZAPRINE HCL 10 MG PO TABS Oral Take 10 mg by mouth daily.      [name] DIAZEPAM 5 MG PO TABS Oral Take 5 mg by mouth at bedtime.      . ISOSORBIDE DINITRATE 20 MG PO TABS Oral Take 10 mg by mouth 2 (two) times daily.      [name] LISINOPRIL-HYDROCHLOROTHIAZIDE 20-12.5 MG PO TABS Oral Take 1 tablet by mouth daily.      [name] METFORMIN HCL 1000 MG PO TABS Oral Take 1,000 mg by mouth 2 (two) times daily with a meal.    .  MULTI-VITAMIN/MINERALS PO TABS Oral Take 1 tablet by mouth daily.      [name] NITROGLYCERIN 0.4 MG SL SUBL Sublingual Place 1 tablet (0.4 mg total) under the tongue every 5 (five) minutes as needed for chest pain. 25 tablet 11  . OMEPRAZOLE 20 MG PO CPDR Oral Take 20 mg by mouth daily.      [name] POTASSIUM CHLORIDE CRYS ER 10 MEQ PO TBCR Oral Take 10 mEq by mouth 2 (two) times daily.    . SERTRALINE HCL 100 MG PO TABS Oral Take 100 mg by mouth daily.     . TRAMADOL HCL 50 MG PO TABS Oral Take 50 mg by mouth every 6 (six) hours as needed. For pain, Maximum dose= 8 tablets per day      BP 126/66  Pulse 79  Temp 98.2 F (36.8 C) (Oral)  Resp 20  SpO2 96%  Physical Exam  Nursing note and vitals reviewed. Constitutional: She is oriented to person, place, and time. She appears well-developed and well-nourished. No distress.  HENT:  Head: Normocephalic and atraumatic.       Oropharynx clear and moist  Eyes:  Conjunctivae and EOM are normal.  Neck: Normal range of motion.  Cardiovascular:       Regular rate rhythm, no aberrancy on auscultation.  Intact distal pulses, no pitting edema bilaterally  Pulmonary/Chest: Effort normal.  Abdominal:       Soft obese abdomen with bowel sounds present.  Generalized tenderness to palpation without localization.  Musculoskeletal: Normal range of motion.  Neurological: She is alert and oriented to person, place, and time.  Skin: Skin is warm and dry. No rash noted. She is not diaphoretic.       Warm and dry nondiaphoretic skin.  Psychiatric: She has a normal mood and affect. Her behavior is normal.    ED Course  Procedures (including critical care time)  Labs Reviewed  COMPREHENSIVE METABOLIC PANEL - Abnormal; Notable for the following:    Glucose, Bld 152 (*)     Total Bilirubin 0.1 (*)     GFR calc non Af Amer 66 (*)     GFR calc Af Amer 76 (*)     All other components within normal limits  GLUCOSE, CAPILLARY - Abnormal; Notable for the following:    Glucose-Capillary 112 (*)     All other components within normal limits  CBC  URINALYSIS, ROUTINE W REFLEX MICROSCOPIC   No results found.   No diagnosis found.   Date: 10/08/2011  Rate: 76  Rhythm: normal sinus rhythm  QRS Axis: normal  Intervals: normal  ST/T Wave abnormalities: normal  Conduction Disutrbances: none  Narrative Interpretation:   Old EKG Reviewed: No significant changes noted     MDM  Gastroenteritis   Patient with symptoms consistent with viral gastroenteritis.  Vitals are stable, no fever or orthostatic hypotension.  No signs of dehydration, tolerating PO fluids > 6 oz.  Lungs are clear.  No focal abdominal pain, no concern for appendicitis, cholecystitis, pancreatitis, ruptured viscus, UTI, kidney stone.  Supportive therapy indicated with return if symptoms worsen.  Patient counseled.          Jaci Carrel, [place] 10/13/11 0222

## 2011-10-08 NOTE — ED Notes (Signed)
Patient ambulated independently to the bathroom

## 2011-10-08 NOTE — ED Notes (Signed)
Patient states she is still unable to produce a urine specimen.

## 2011-10-08 NOTE — ED Notes (Signed)
PT unable to provide urine at this time 

## 2011-10-08 NOTE — ED Notes (Signed)
Patient drank approximately 6 oz of ice water with no problem.

## 2011-10-08 NOTE — ED Notes (Signed)
51 year old female with a history of 3 days of watery diarrhea, one day of nausea and vomiting. The diarrhea is watery, nonbloody and associated with only minimal occasional cramping. She denies fevers, chills, cough, shortness of breath, swelling, rashes.  On physical exam the patient has a soft benign abdomen that is minimally tender on the left side, there is no right lower quadrant or right upper quadrant tenderness, no guarding, no masses. Lungs are clear, heart sounds are regular and clear without murmurs and she has normal capillary refill. There are no rashes on her skin and no edema of her lower extremities.  Assessment: The patient has a relative dehydration, she has been given oral fluids, IV fluids, check a urinalysis showing no signs of infection or dehydration as there are no ketones and a relative and normal specific gravity. The patient will be discharged home in stable condition.  Filed Vitals:   10/08/11 0230  BP: 131/76  Pulse: 78  Temp:   Resp:    Results for orders placed during the hospital encounter of 10/07/11  CBC      Component Value Range   WBC 7.6  4.0 - 10.5 K/uL   RBC 4.20  3.87 - 5.11 MIL/uL   Hemoglobin 12.2  12.0 - 15.0 g/dL   HCT 16.1  09.6 - 04.5 %   MCV 86.2  78.0 - 100.0 fL   MCH 29.0  26.0 - 34.0 pg   MCHC 33.7  30.0 - 36.0 g/dL   RDW 40.9  81.1 - 91.4 %   Platelets 250  150 - 400 K/uL  COMPREHENSIVE METABOLIC PANEL      Component Value Range   Sodium 141  135 - 145 mEq/L   Potassium 3.7  3.5 - 5.1 mEq/L   Chloride 101  96 - 112 mEq/L   CO2 30  19 - 32 mEq/L   Glucose, Bld 152 (*) 70 - 99 mg/dL   BUN 11  6 - 23 mg/dL   Creatinine, Ser 7.82  0.50 - 1.10 mg/dL   Calcium 9.4  8.4 - 95.6 mg/dL   Total Protein 7.0  6.0 - 8.3 g/dL   Albumin 3.6  3.5 - 5.2 g/dL   AST 26  0 - 37 U/L   ALT 32  0 - 35 U/L   Alkaline Phosphatase 53  39 - 117 U/L   Total Bilirubin 0.1 (*) 0.3 - 1.2 mg/dL   GFR calc non Af Amer 66 (*) >90 mL/min   GFR calc Af Amer 76  (*) >90 mL/min  GLUCOSE, CAPILLARY      Component Value Range   Glucose-Capillary 112 (*) 70 - 99 mg/dL  URINALYSIS, ROUTINE W REFLEX MICROSCOPIC      Component Value Range   Color, Urine YELLOW  YELLOW   APPearance CLEAR  CLEAR   Specific Gravity, Urine 1.025  1.005 - 1.030   pH 5.0  5.0 - 8.0   Glucose, UA NEGATIVE  NEGATIVE mg/dL   Hgb urine dipstick NEGATIVE  NEGATIVE   Bilirubin Urine NEGATIVE  NEGATIVE   Ketones, ur NEGATIVE  NEGATIVE mg/dL   Protein, ur NEGATIVE  NEGATIVE mg/dL   Urobilinogen, UA 0.2  0.0 - 1.0 mg/dL   Nitrite NEGATIVE  NEGATIVE   Leukocytes, UA NEGATIVE  NEGATIVE      Medical screening examination/treatment/procedure(s) were conducted as a shared visit with non-physician practitioner(s) and myself.  I personally evaluated the patient during the encounter    Vida Roller, MD  10/08/11 0332 

## 2011-10-08 NOTE — Discharge Instructions (Signed)
Follow up with your primary care doctor about your hospital visit. Continue to hydrate orally.Take all medications as prescribed.  Read the instructions below for reasons to return to the ER.   The 'BRAT' diet is suggested, then progress to diet as tolerated as symptoms allow. Call if bloody stools, persistent diarrhea, vomiting, fever or abdominal pain. Bananas.  Rice.  Applesauce.  Toast (and other simple starches such as crackers, potatoes, noodles).   SEEK IMMEDIATE MEDICAL ATTENTION IF:  You begin having localized abdominal pain that does not go away or becomes severe (The right side could  possibly be appendicitis. In an adult, the left lower portion of the abdomen could be colitis or diverticulitis)   A temperature above 101 develops  Repeated vomiting occurs (multiple uncontrollable episodes) or you are unable to keep fluids down  Blood is being passed in stools or vomit (bright red or black tarry stools).   Return also if you develop chest pain, difficulty breathing, dizziness or fainting, or become confused, poorly responsive, or inconsolable (young children).   RESOURCE GUIDE  Dental Problems  Patients with Medicaid: [facility] [phone]                                           [phone] [address] [employer] [address] Phone:  [phone]                                                  Phone:  [phone]  If unable to pay or uninsured, contact:  [facility] or [facility]. to become qualified for the adult dental clinic.  Chronic Pain Problems Contact [facility]  [phone] Patients need to be referred by their primary care doctor.  Insufficient Money for Medicine Contact [employer]:  call "211" or [facility] ([phone].  No Primary Care Doctor Call [facility]  [phone] Other agencies that provide inexpensive medical care    [facility]  [phone]    [facility]  Internal Medicine  [phone]    [facility]  [phone]    [facility]  [phone]    [facility]  [phone]    [facility]  [phone]  Psychological Services [facility] Behavioral [facility]  [phone] [facility] Services  [phone] [facility] Mental Health   [phone] (emergency services [phone])  Substance Abuse Resources [facility]  [phone] [facility] [phone] [facility] [place] [phone] Floydene Flock [phone] Residential & Outpatient Substance Abuse Program  [phone]  Abuse/Neglect [facility] Child Abuse Hotline [phone] [phone] [facility] Child Abuse Hotline 313-358-3108 (After Hours)  Emergency Shelter [facility] [phone]  Maternity Homes [facility] [place] [facility] [phone] [facility] [phone]  MRSA Hotline #:   857-314-5238    [facility] Resources  [facility]     [employer]                          [facility] Dept. [address]  [place]                       [address]      [address]  Blondell Reveal Phone:  [phone]                                   Phone:  [phone]                 Phone:  [phone]  [facility] Mental Health Phone:  [phone]  [facility] Child Abuse Hotline 531-655-3526 [phone] (After Hours)

## 2011-10-13 NOTE — ED Provider Notes (Signed)
Medical screening examination/treatment/procedure(s) were conducted as a shared visit with non-physician practitioner(s) and myself.  I personally evaluated the patient during the encounter  Please see my separate respective documentation pertaining to this patient encounter   Vida Roller, MD 10/13/11 [phone]

## 2011-11-20 ENCOUNTER — Encounter (HOSPITAL_COMMUNITY): Payer: Self-pay | Admitting: Emergency Medicine

## 2011-11-20 ENCOUNTER — Emergency Department (HOSPITAL_COMMUNITY): Payer: Medicare Other

## 2011-11-20 ENCOUNTER — Emergency Department (HOSPITAL_COMMUNITY)
Admission: EM | Admit: 2011-11-20 | Discharge: 2011-11-21 | Disposition: A | Discharge status: Home / Self Care | Payer: Medicare Other | Attending: Emergency Medicine | Admitting: Emergency Medicine

## 2011-11-20 DIAGNOSIS — J45909 Unspecified asthma, uncomplicated: Secondary | ICD-10-CM | POA: Insufficient documentation

## 2011-11-20 DIAGNOSIS — R079 Chest pain, unspecified: Secondary | ICD-10-CM | POA: Insufficient documentation

## 2011-11-20 DIAGNOSIS — E119 Type 2 diabetes mellitus without complications: Secondary | ICD-10-CM | POA: Insufficient documentation

## 2011-11-20 DIAGNOSIS — I10 Essential (primary) hypertension: Secondary | ICD-10-CM | POA: Insufficient documentation

## 2011-11-20 DIAGNOSIS — Z87891 Personal history of nicotine dependence: Secondary | ICD-10-CM | POA: Insufficient documentation

## 2011-11-20 DIAGNOSIS — F341 Dysthymic disorder: Secondary | ICD-10-CM | POA: Insufficient documentation

## 2011-11-20 DIAGNOSIS — Z7982 Long term (current) use of aspirin: Secondary | ICD-10-CM | POA: Insufficient documentation

## 2011-11-20 LAB — POCT I-STAT TROPONIN I: Troponin i, poc: 0.01 ng/mL (ref 0.00–0.08)

## 2011-11-20 MED ORDER — MORPHINE SULFATE 4 MG/ML IJ SOLN: 4.0000 mg | Freq: Once | INTRAMUSCULAR | Status: DC

## 2011-11-20 MED ORDER — HYDROMORPHONE HCL PF 1 MG/ML IJ SOLN
1.0000 mg | Freq: Once | INTRAMUSCULAR | Status: AC
  Administered 2011-11-20: 1 mg via INTRAMUSCULAR
  Filled 2011-11-20: qty 1

## 2011-11-20 MED ORDER — KETOROLAC TROMETHAMINE 30 MG/ML IJ SOLN: 30.0000 mg | Freq: Once | INTRAMUSCULAR | Status: DC

## 2011-11-20 MED ORDER — KETOROLAC TROMETHAMINE 60 MG/2ML IM SOLN
60.0000 mg | Freq: Once | INTRAMUSCULAR | Status: AC
  Administered 2011-11-20: 60 mg via INTRAMUSCULAR
  Filled 2011-11-20: qty 2

## 2011-11-20 NOTE — ED Provider Notes (Addendum)
I have seen and examined this patient with the resident.  I agree with the resident's note, assessment and plan except as indicated.     Patient with complaints of chest pain radiating to her back.  It has been going on since this morning.  She notes it worsens with certain movements in bed.  Exertion does not make a difference.  Patient denies cough or fevers.  This is similar to prior episodes the patient has had.  Notably on medical record review patient has had multiple CT scans to rule out PE which have all been negative, at a total of 27.  Patient is not noted to be hypoxic or tachycardic here.  The patient has no specific cardiac history.  Patient has a normal EKG.  She has a normal chest x-ray.  Will obtain a point-of-care troponin given the patient's risk factors although this is atypical for ACS.  We'll give the patient pain medicine at this time.  As patient has had multiple evaluations for similar symptoms in the past with negative CT scans I do not want to pursue further evaluation for pulmonary embolus at this time.  Nat Christen, MD 11/20/11 2247  I have seen the patient's EKG and agree with the resident read.  Nat Christen, MD 11/20/11 2250

## 2011-11-20 NOTE — ED Notes (Signed)
Cp sob since this am feeling weak and sweating

## 2011-11-20 NOTE — ED Provider Notes (Signed)
History     CSN: [id number]  Arrival date & time 11/20/11  1843   First MD Initiated Contact with Patient 11/20/11 2041      Chief Complaint  Patient presents with  . Chest Pain    (Consider location/radiation/quality/duration/timing/severity/associated sxs/prior treatment) Patient is a 51 y.o. female presenting with chest pain. The history is provided by the patient.  Chest Pain The chest pain began 12 - 24 hours ago. Duration of episode(s) is 16 hours. Chest pain occurs constantly. The chest pain is unchanged. The pain is associated with breathing and lifting. The severity of the pain is moderate. The quality of the pain is described as sharp and similar to previous episodes. The pain radiates to the upper back. Chest pain is worsened by certain positions and deep breathing. Pertinent negatives for primary symptoms include no fever, no fatigue, no shortness of breath, no cough, no wheezing, no palpitations, no abdominal pain, no nausea, no vomiting and no dizziness.  Pertinent negatives for associated symptoms include no diaphoresis and no numbness. She tried nothing for the symptoms.  Her past medical history is significant for hypertension and PE.  Pertinent negatives for past medical history include no seizures.  Procedure history is positive for cardiac catheterization.     Past Medical History  Diagnosis Date  . Hypertension   . Diabetes mellitus   . Asthma   . Anginal pain   . Depression   . Anxiety   . Pulmonary embolism   . Sleep apnea     Past Surgical History  Procedure Date  . Abdominal hysterectomy 2005  . Leg surgery   . Cardiac catheterization     Normal  . Tubal ligation 1989  . Left knee surgery 2008    Family History  Problem Relation Age of Onset  . Emphysema Mother   . Asthma Brother   . Heart disease Paternal Grandfather   . Heart disease Father   . Arthritis Mother   . Colon cancer Maternal Grandfather     History  Substance Use Topics    . Smoking status: Former Smoker -- 0.5 packs/day for 15 years    Types: Cigarettes  . Smokeless tobacco: Not on file  . Alcohol Use: No    OB History    Grav Para Term Preterm Abortions TAB SAB Ect Mult Living                  Review of Systems  Constitutional: Negative for fever, chills, diaphoresis and fatigue.  HENT: Negative for ear pain, congestion, sore throat, facial swelling, mouth sores, trouble swallowing, neck pain and neck stiffness.   Eyes: Negative.   Respiratory: Negative for apnea, cough, chest tightness, shortness of breath and wheezing.   Cardiovascular: Positive for chest pain. Negative for palpitations and leg swelling.  Gastrointestinal: Negative for nausea, vomiting, abdominal pain, diarrhea and abdominal distention.  Genitourinary: Negative for hematuria, flank pain, vaginal discharge, difficulty urinating and menstrual problem.  Musculoskeletal: Negative for back pain and gait problem.  Skin: Negative for rash and wound.  Neurological: Negative for dizziness, tremors, seizures, syncope, facial asymmetry, numbness and headaches.  Psychiatric/Behavioral: Negative.   All other systems reviewed and are negative.    Allergies  Penicillins and Sulfa antibiotics  Home Medications   Current Outpatient Rx  Name Route Sig Dispense Refill  . ALBUTEROL 90 MCG/ACT IN AERS Inhalation Inhale 2 puffs into the lungs 3 (three) times daily as needed. For shortness of breath     .  ASPIRIN 81 MG PO TABS Oral Take 81 mg by mouth daily.      . ATORVASTATIN CALCIUM 20 MG PO TABS Oral Take 20 mg by mouth at bedtime.     [name] CETIRIZINE HCL 10 MG PO TABS Oral Take 10 mg by mouth daily.    [name] CLONIDINE HCL 0.1 MG PO TABS Oral Take 0.1 mg by mouth 3 (three) times daily.     . CYCLOBENZAPRINE HCL 10 MG PO TABS Oral Take 10 mg by mouth daily.      [name] DIAZEPAM 5 MG PO TABS Oral Take 5 mg by mouth at bedtime.      [name] VICODIN PO Oral Take 1 tablet by mouth every 4 (four) hours as needed.  For pain    . IBUPROFEN PO Oral Take 1 tablet by mouth every 4 (four) hours as needed. For pain    . ISOSORBIDE DINITRATE 20 MG PO TABS Oral Take 20 mg by mouth 2 (two) times daily.     [name] LISINOPRIL-HYDROCHLOROTHIAZIDE 20-12.5 MG PO TABS Oral Take 1 tablet by mouth daily.      [name] METFORMIN HCL 1000 MG PO TABS Oral Take 1,000 mg by mouth 2 (two) times daily with a meal.    . MULTI-VITAMIN/MINERALS PO TABS Oral Take 1 tablet by mouth daily.      [name] NITROGLYCERIN 0.4 MG SL SUBL Sublingual Place 1 tablet (0.4 mg total) under the tongue every 5 (five) minutes as needed for chest pain. 25 tablet 11  . OMEPRAZOLE 20 MG PO CPDR Oral Take 20 mg by mouth daily.      [name] POTASSIUM CHLORIDE CRYS ER 10 MEQ PO TBCR Oral Take 10 mEq by mouth 2 (two) times daily.    . SERTRALINE HCL 100 MG PO TABS Oral Take 100 mg by mouth daily.       BP 136/63  Pulse 76  Temp 98.1 F (36.7 C)  Resp 23  SpO2 100%  Physical Exam  Nursing note and vitals reviewed. Constitutional: She is oriented to person, place, and time. She appears well-developed and well-nourished. No distress.  HENT:  Head: Normocephalic and atraumatic.  Right Ear: External ear normal.  Left Ear: External ear normal.  Nose: Nose normal.  Mouth/Throat: Oropharynx is clear and moist. No oropharyngeal exudate.  Eyes: Conjunctivae and EOM are normal. Pupils are equal, round, and reactive to light. Right eye exhibits no discharge. Left eye exhibits no discharge.  Neck: Normal range of motion. Neck supple. No JVD present. No tracheal deviation present. No thyromegaly present.  Cardiovascular: Normal rate, regular rhythm, normal heart sounds and intact distal pulses.  Exam reveals no gallop and no friction rub.   No murmur heard. Pulmonary/Chest: Effort normal and breath sounds normal. No respiratory distress. She has no wheezes. She has no rales. She exhibits tenderness (some pain with palpation of the right chest).  Abdominal: Soft. Bowel sounds are  normal. She exhibits no distension. There is no tenderness. There is no rebound and no guarding.  Musculoskeletal: Normal range of motion.  Lymphadenopathy:    She has no cervical adenopathy.  Neurological: She is alert and oriented to person, place, and time. No cranial nerve deficit. Coordination normal.  Skin: Skin is warm. No rash noted. She is not diaphoretic.  Psychiatric: She has a normal mood and affect. Her behavior is normal. Judgment and thought content normal.    ED Course  Procedures (including critical care time)  Labs Reviewed - No data to display  Dg Chest 2 View  11/20/2011  *RADIOLOGY REPORT*  Clinical Data: Chest pain.  CHEST - 2 VIEW  Comparison: April 22, 2011.  Findings: Cardiomediastinal silhouette appears normal.  No acute pulmonary disease is noted.  Bony thorax appears intact.  IMPRESSION: No acute cardiopulmonary abnormality seen.  Original Report Authenticated By: Venita Sheffield., M.D.     No diagnosis found.    MDM  51 year old female patient with remote history of pulmonary embolism no longer on anticoagulation presents with chest pain typical of previous presentations to the emergency department with chest pain. Patient has presented with similar chest pain multiple times in the past and has had positive and negative d-dimers. Every single time patient had a positive d-dimer she had a negative CT scan. Patient has had 27 negative CT scans of his chest. Patient says pain is somewhat all his previous episodes. Patient says it is mid to right sided radiates to her back worse with breathing and movement. Patient not hypoxic not tachycardic not hemoptic no signs of DVT and a similar exam to previous notes written. Patient with pain for the past 16 hours, negative troponin normal EKG and chest x-ray make ACS very doubtful as she had no coronary artery disease and a catheterization in the past. We'll treat pain and emergency department but patient not having  pneumonia pneumothorax ACS pericarditis. Clinical suspicion for pulmonary embolism is very low as she's had similar presentations in the past with negative CTs 27 time  Results for orders placed during the hospital encounter of 11/20/11  POCT I-STAT TROPONIN I      Component Value Range   Troponin i, poc 0.01  0.00 - 0.08 ng/mL   Comment 3            DG Chest 2 View (Final result)   Result time:11/20/11 2210    Final result by Rad Results In Interface (11/20/11 22:10:39)    Narrative:   *RADIOLOGY REPORT*  Clinical Data: Chest pain.  CHEST - 2 VIEW  Comparison: April 22, 2011.  Findings: Cardiomediastinal silhouette appears normal. No acute pulmonary disease is noted. Bony thorax appears intact.  IMPRESSION: No acute cardiopulmonary abnormality seen.  Original Report Authenticated By: Venita Sheffield., M.D.    Date: 11/20/2011  Rate: 86  Rhythm: normal sinus rhythm  QRS Axis: normal  Intervals: normal  ST/T Wave abnormalities: normal  Conduction Disutrbances: none  Narrative Interpretation:   Old EKG Reviewed: No significant changes noted   Case discussed with Dr. Roger Kill, MD 11/20/11 2348

## 2011-11-23 NOTE — ED Provider Notes (Signed)
I have seen and examined this patient with the resident.  I agree with the resident's note, assessment and plan except as indicated.     Cherelle Midkiff A Ronson Hagins, MD 11/23/11 0056 

## 2011-11-28 IMAGING — CT CT ANGIO CHEST
2 of 4 series · 19 of 36 positions shown · IV contrast (APPLIED)
Comparison: 11/06/2009 and earlier.

CLINICAL DATA: 49-year-old female with chest pain, back pain,
fever, sore throat.

CT ANGIOGRAPHY CHEST WITH CONTRAST
TECHNIQUE: Multidetector CT imaging of the chest was performed
using the standard protocol during bolus administration of
intravenous contrast.  Multiplanar CT image reconstructions
including MIPs were obtained to evaluate the vascular anatomy.
Contrast:  80 ml Omnipaque 350.

[Series 9: pulm embolism 1.0 b25f thins · axial · 0.63mm/px · z∈[-69,+153]mm · 16 of 248 slices shown]
[im 13/248  lung]
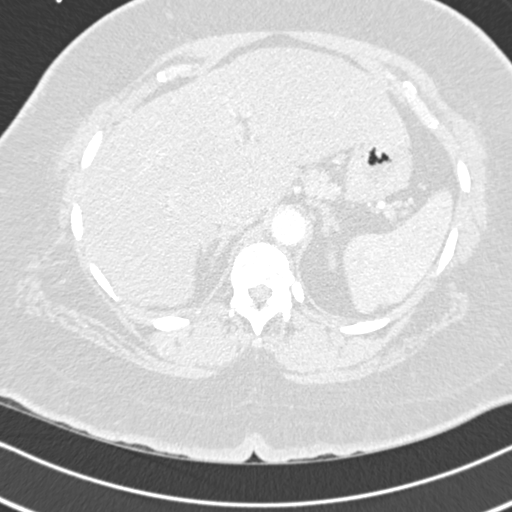
[im 25/248  mediastinal]
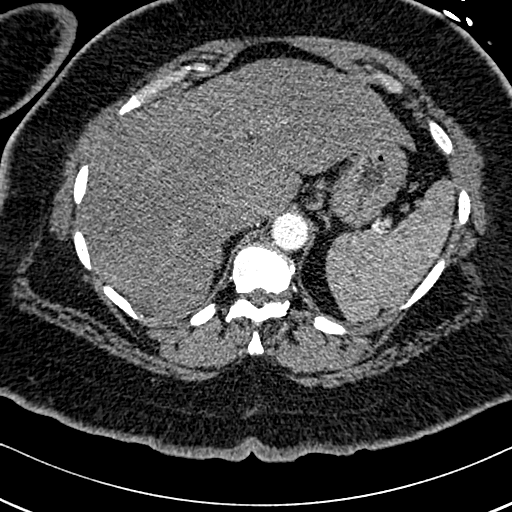
[im 38/248  lung]
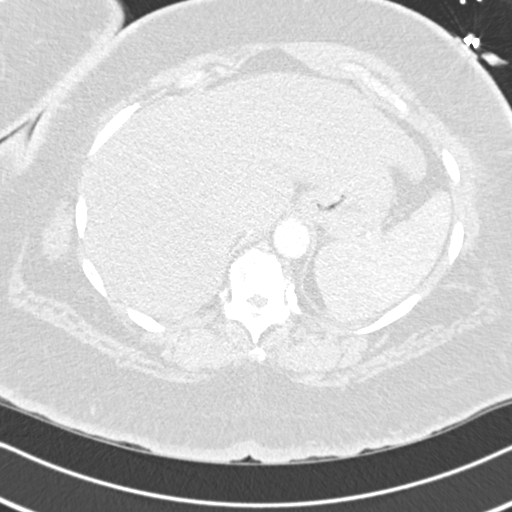
[im 62/248  mediastinal]
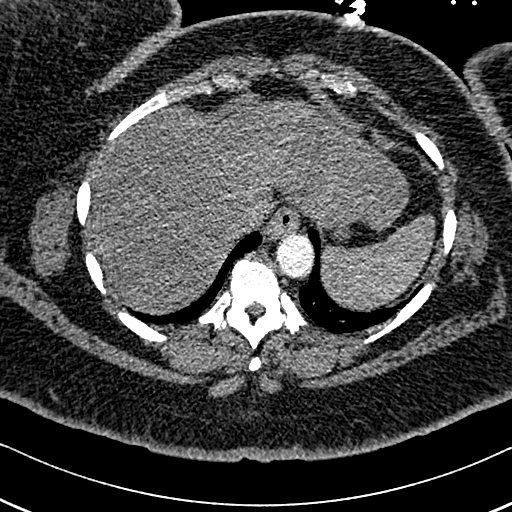
[im 75/248  lung]
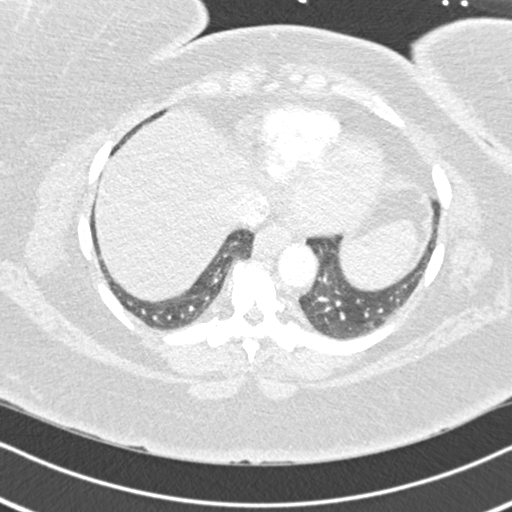
[im 87/248  mediastinal]
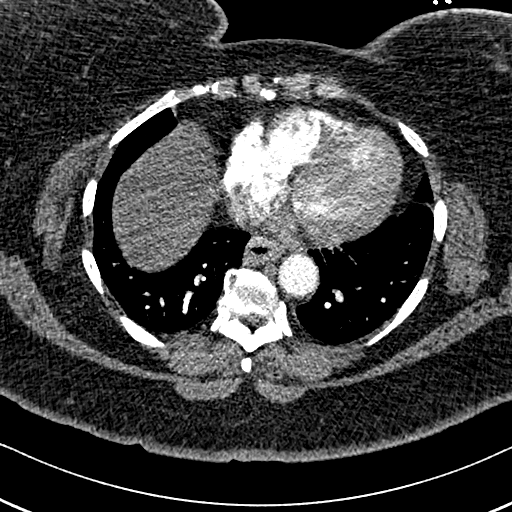
[im 99/248  lung]
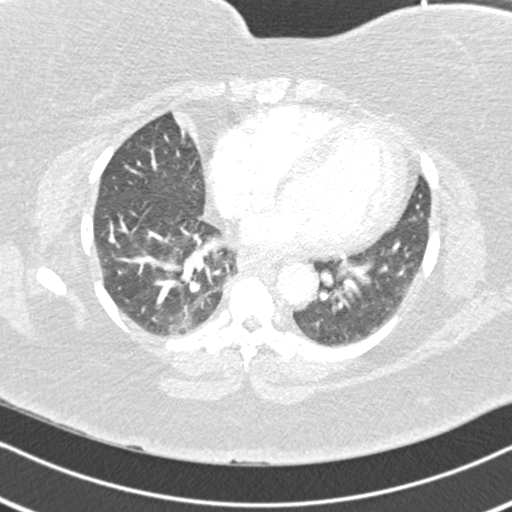
[im 112/248  mediastinal]
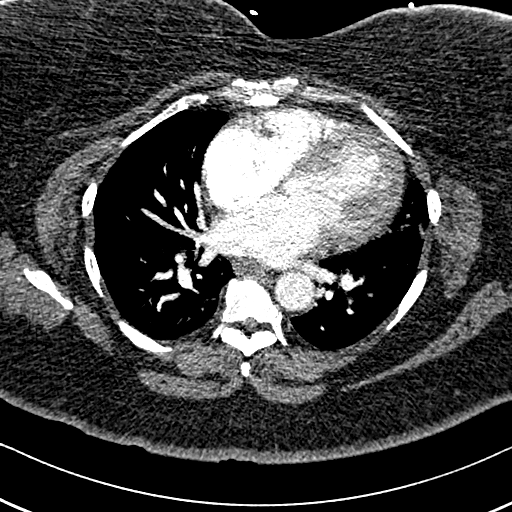
[im 136/248  lung]
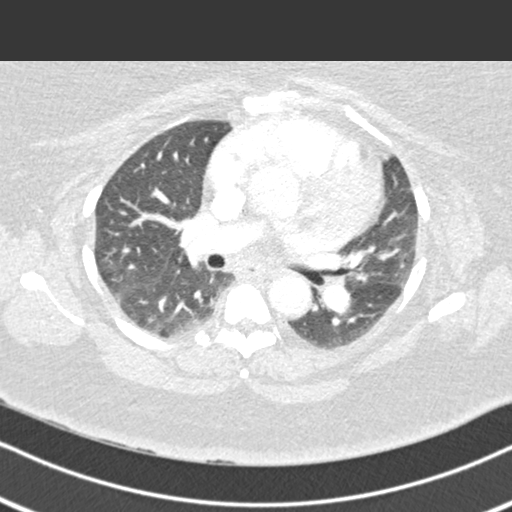
[im 149/248  mediastinal]
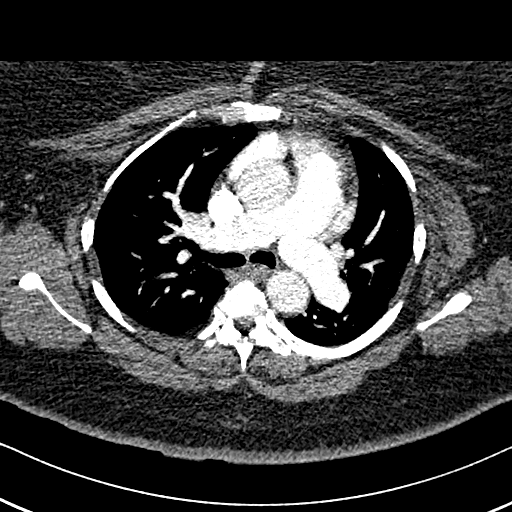
[im 161/248  lung]
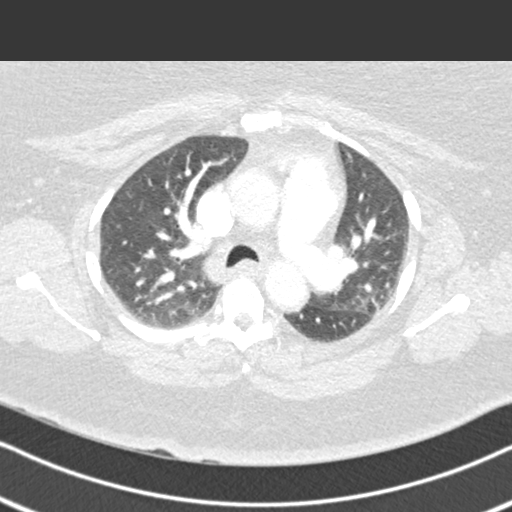
[im 173/248  mediastinal]
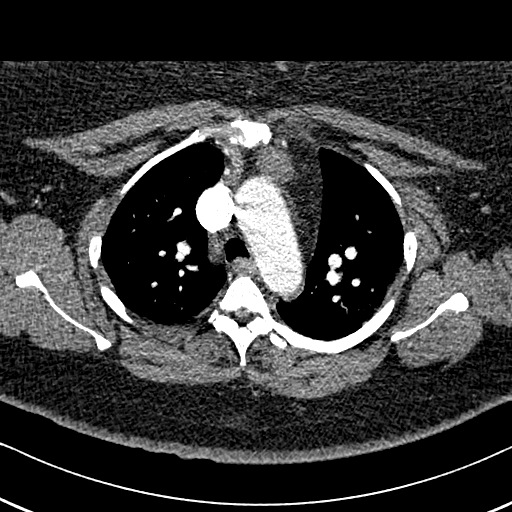
[im 186/248  lung]
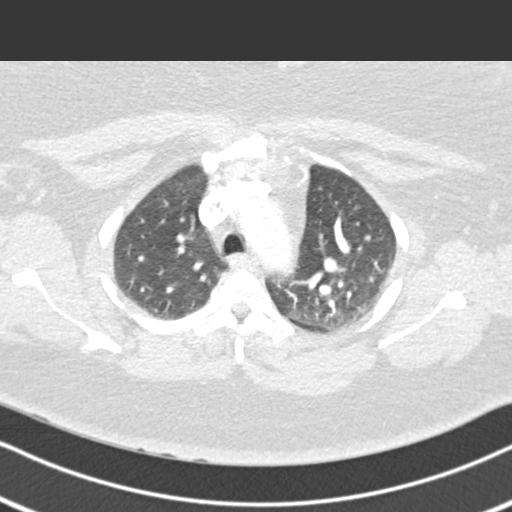
[im 210/248  mediastinal]
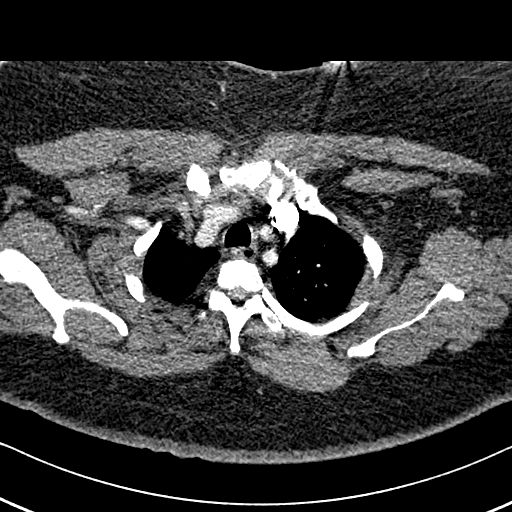
[im 223/248  lung]
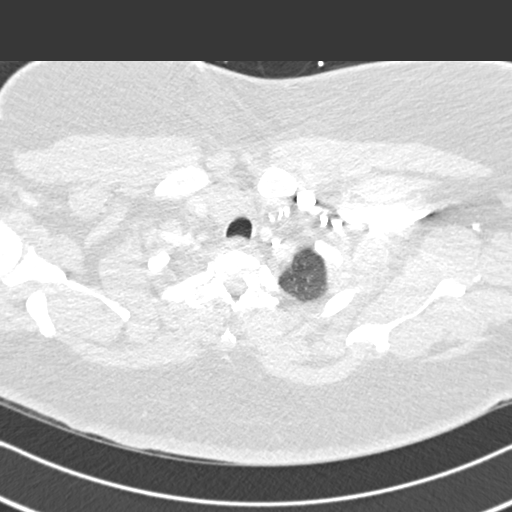
[im 235/248  mediastinal]
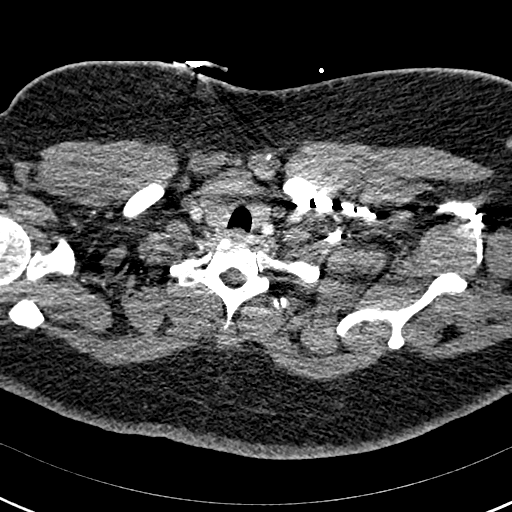

[Series 602: cor · coronal · 0.63mm/px · 3 of 94 slices shown]
[im 19/94  mediastinal]
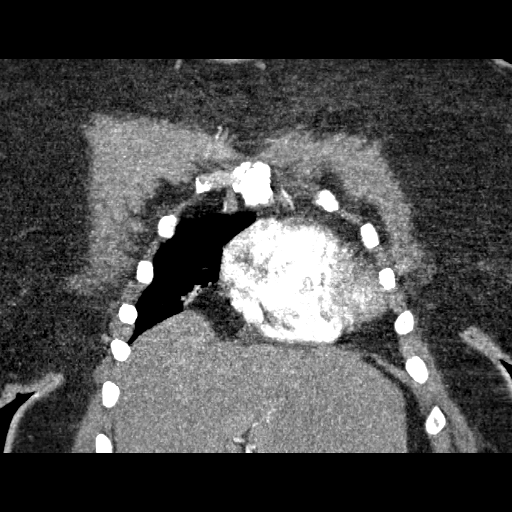
[im 38/94  mediastinal]
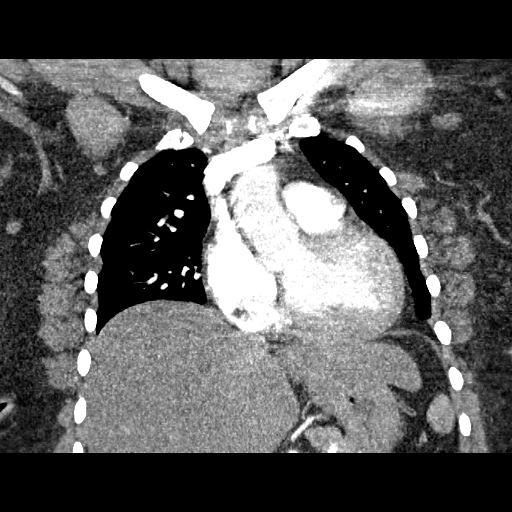
[im 56/94  mediastinal]
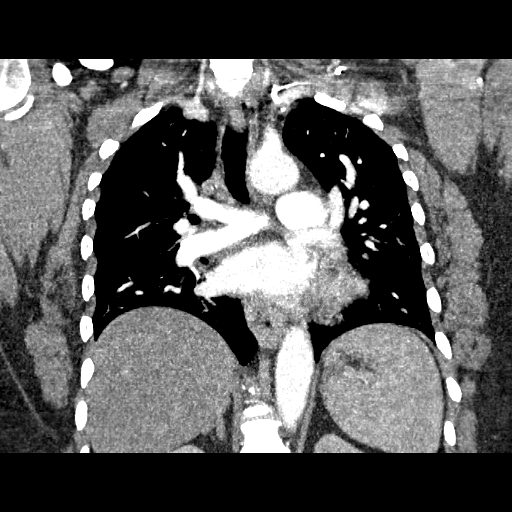

[19 of 36 positions shown; findings below may reference images not displayed]

FINDINGS: Good contrast bolus timing in the pulmonary arterial
tree.  Ectatic pulmonary arterial vasculature re-identified. No
focal filling defect identified in the pulmonary arterial tree to
suggest the presence of acute pulmonary embolism.

Cardiomegaly.  No pericardial or pleural effusion.  There is also
adequate opacification of the thoracic and upper abdominal aorta
which are stable and within normal limits.  Visualized upper
abdominal viscera are within normal limits.  Stable thyromegaly.
Stable low density chronic anterior mediastinal lesion.  No
lymphadenopathy.

Increased dependent atelectasis and diffuse ground-glass opacity.
Major airways are stable and patent.  Chronic mild right middle
lobe atelectasis.  No acute pulmonary opacity.

Stable visualized osseous structures.

Review of the MIP images confirms the above findings.
IMPRESSION: 1. No evidence of acute pulmonary embolus.
2.  Increased pulmonary atelectasis, otherwise no acute findings in
the chest.  Chronic findings include benign anterior mediastinal
lesion.

## 2011-11-28 IMAGING — CR DG CHEST 2V
2 series · 2 of 2 positions shown · non-contrast
Comparison: 11/06/2009 and earlier.

CLINICAL DATA: 49-year-old female with pain.

CHEST - 2 VIEW

[w chest pa]
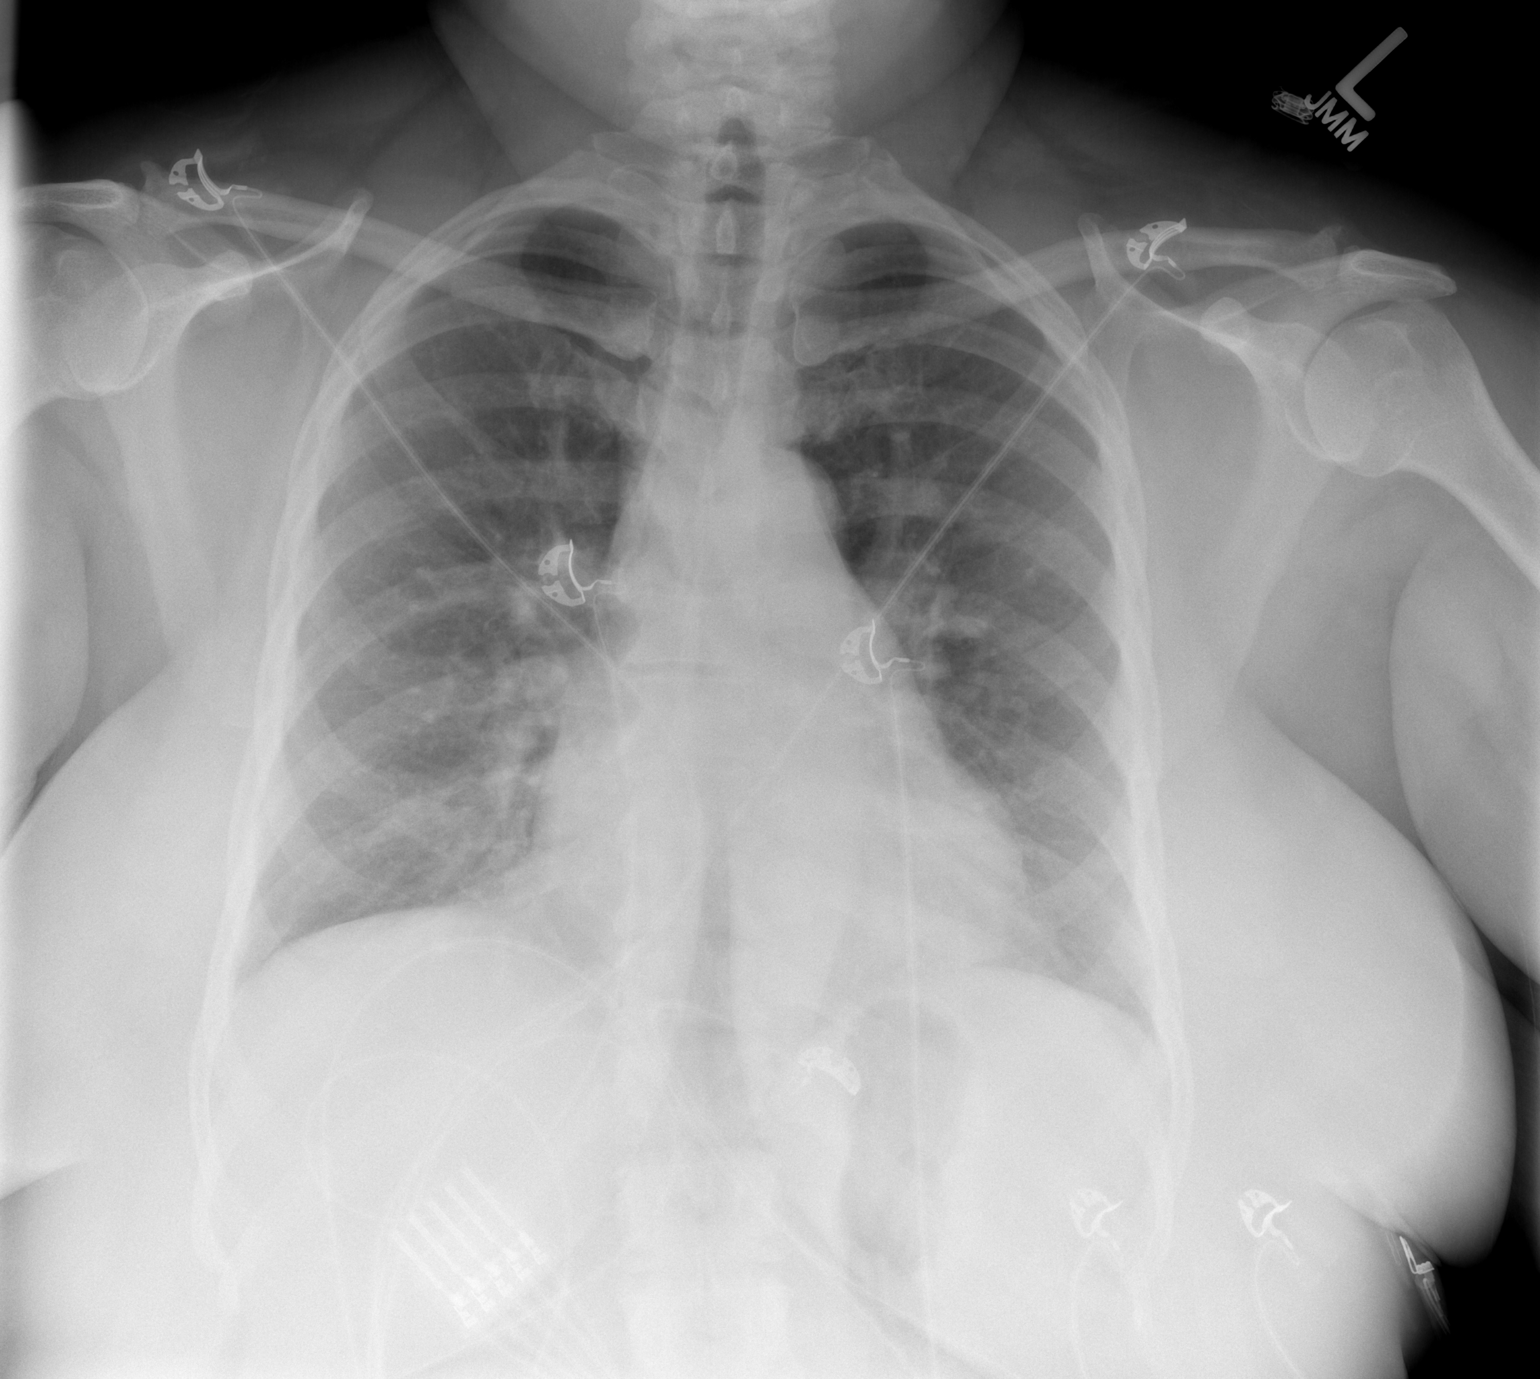

[w chest lat]
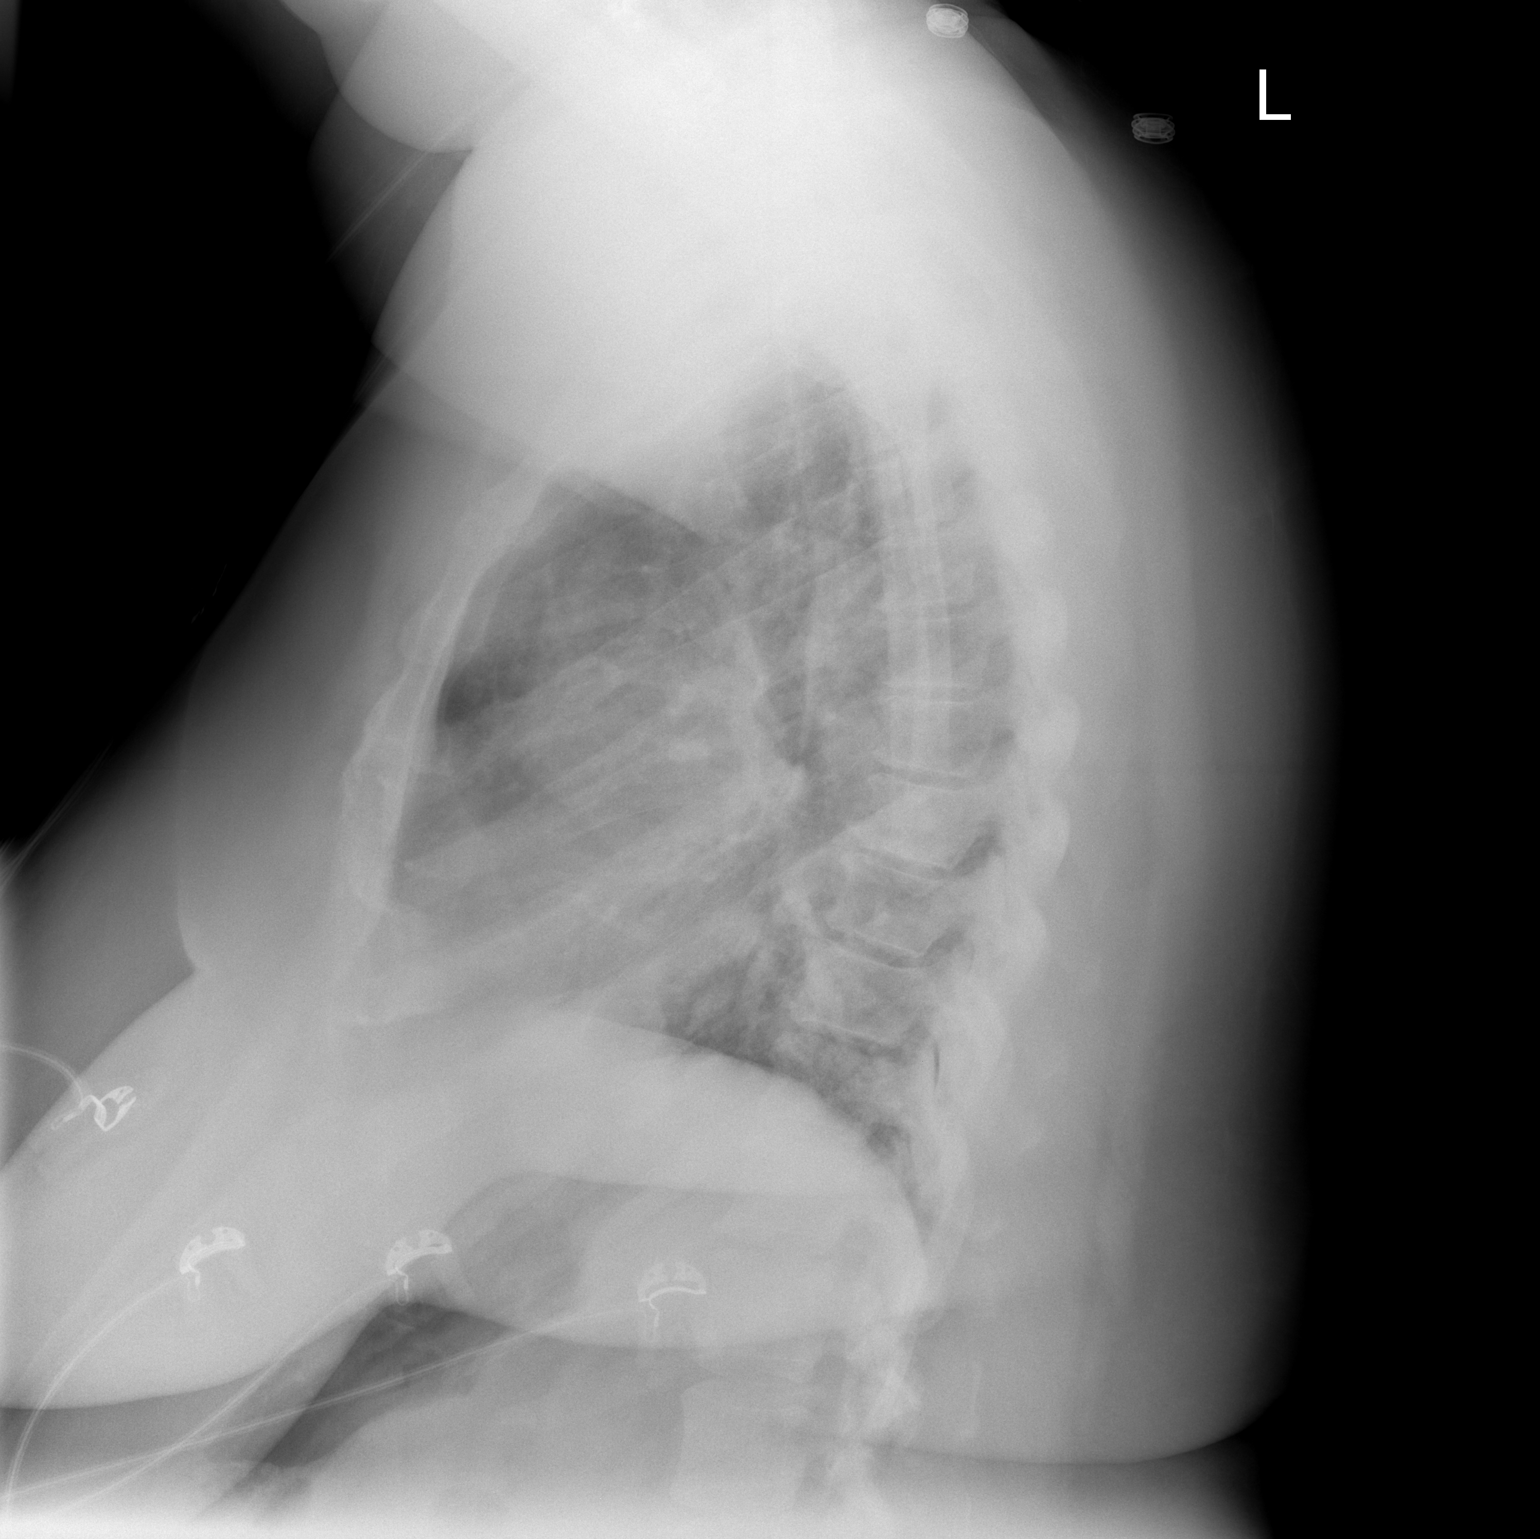

[2 of 2 positions shown; findings below may reference images not displayed]

FINDINGS: Stable lung volumes. Stable cardiomegaly and mediastinal
contours.  No pneumothorax, pleural effusion or consolidation.
Chronic streaky basilar predominant pulmonary opacity is not
significantly changed since 0447.  No acute airspace opacity.
Stable visualized osseous structures.
IMPRESSION: Chronic lung parenchymal changes and mild cardiomegaly. No acute
cardiopulmonary abnormality.

## 2011-12-02 ENCOUNTER — Emergency Department (HOSPITAL_COMMUNITY): Payer: Medicare Other

## 2011-12-02 ENCOUNTER — Emergency Department (HOSPITAL_COMMUNITY)
Admission: EM | Admit: 2011-12-02 | Discharge: 2011-12-02 | Disposition: A | Discharge status: Home / Self Care | Payer: Medicare Other | Attending: Emergency Medicine | Admitting: Emergency Medicine

## 2011-12-02 ENCOUNTER — Encounter (HOSPITAL_COMMUNITY): Payer: Self-pay | Admitting: *Deleted

## 2011-12-02 DIAGNOSIS — Z87891 Personal history of nicotine dependence: Secondary | ICD-10-CM | POA: Insufficient documentation

## 2011-12-02 DIAGNOSIS — I10 Essential (primary) hypertension: Secondary | ICD-10-CM | POA: Insufficient documentation

## 2011-12-02 DIAGNOSIS — Z86711 Personal history of pulmonary embolism: Secondary | ICD-10-CM | POA: Insufficient documentation

## 2011-12-02 DIAGNOSIS — E119 Type 2 diabetes mellitus without complications: Secondary | ICD-10-CM | POA: Insufficient documentation

## 2011-12-02 DIAGNOSIS — J45909 Unspecified asthma, uncomplicated: Secondary | ICD-10-CM | POA: Insufficient documentation

## 2011-12-02 DIAGNOSIS — R079 Chest pain, unspecified: Secondary | ICD-10-CM | POA: Insufficient documentation

## 2011-12-02 DIAGNOSIS — G4730 Sleep apnea, unspecified: Secondary | ICD-10-CM | POA: Insufficient documentation

## 2011-12-02 LAB — POCT I-STAT TROPONIN I: Troponin i, poc: 0.01 ng/mL (ref 0.00–0.08)

## 2011-12-02 LAB — POCT I-STAT, CHEM 8
BUN: 9 mg/dL (ref 6–23)
Calcium, Ion: 1.23 mmol/L (ref 1.12–1.23)
Chloride: 100 mEq/L (ref 96–112)
Creatinine, Ser: 0.9 mg/dL (ref 0.50–1.10)
Glucose, Bld: 111 mg/dL — ABNORMAL HIGH (ref 70–99)
HCT: 37 % (ref 36.0–46.0)
Hemoglobin: 12.6 g/dL (ref 12.0–15.0)
Potassium: 3.4 mEq/L — ABNORMAL LOW (ref 3.5–5.1)
Sodium: 141 mEq/L (ref 135–145)
TCO2: 28 mmol/L (ref 0–100)

## 2011-12-02 MED ORDER — HYDROCODONE-ACETAMINOPHEN 5-325 MG PO TABS: 1.0000 | ORAL_TABLET | ORAL | Status: AC | PRN

## 2011-12-02 NOTE — ED Notes (Signed)
Pt is here with chest pain that started yesterday that has been constant and reports SOB.  Pt sts it could be her asthma.  Pt reports exposure to mold.  Pt reports some wheezing.  Complains of pain in shoulder blades and back with deep breathing.  Pt is talking in complete sentences

## 2011-12-02 NOTE — ED Provider Notes (Signed)
Medical screening examination/treatment/procedure(s) were performed by non-physician practitioner and as supervising physician I was immediately available for consultation/collaboration.   Janellie Tennison B. Bernette Mayers, MD 12/02/11 1321

## 2011-12-02 NOTE — ED Provider Notes (Signed)
History     CSN: [id number]  Arrival date & time 12/02/11  1137   First MD Initiated Contact with Patient 12/02/11 1142      Chief Complaint  Patient presents with  . Chest Pain    (Consider location/radiation/quality/duration/timing/severity/associated sxs/prior treatment) Patient is a 51 y.o. female presenting with chest pain. The history is provided by the patient.  Chest Pain The chest pain began yesterday. Chest pain occurs constantly. Primary symptoms include shortness of breath, cough and wheezing. Pertinent negatives for primary symptoms include no fever, no abdominal pain, no nausea and no vomiting. Associated symptoms comments: Chest pain since yesterday that causes sharp pain that goes to the back and shortness of breath. No fever. She has had a mild, nonproductive cough. She has a history of asthma and states it feels like her previous attacks of asthma. She did not use her inhaler to see if this would relieve her symptoms. She is concerned because she feels there is mold in her house and wants to be evaluated for this..     Past Medical History  Diagnosis Date  . Hypertension   . Diabetes mellitus   . Asthma   . Anginal pain   . Depression   . Anxiety   . Pulmonary embolism   . Sleep apnea     Past Surgical History  Procedure Date  . Abdominal hysterectomy 2005  . Leg surgery   . Cardiac catheterization     Normal  . Tubal ligation 1989  . Left knee surgery 2008    Family History  Problem Relation Age of Onset  . Emphysema Mother   . Asthma Brother   . Heart disease Paternal Grandfather   . Heart disease Father   . Arthritis Mother   . Colon cancer Maternal Grandfather     History  Substance Use Topics  . Smoking status: Former Smoker -- 0.5 packs/day for 15 years    Types: Cigarettes  . Smokeless tobacco: Not on file  . Alcohol Use: No    OB History    Grav Para Term Preterm Abortions TAB SAB Ect Mult Living                  Review of  Systems  Constitutional: Negative for fever.  Respiratory: Positive for cough, shortness of breath and wheezing.   Cardiovascular: Positive for chest pain.  Gastrointestinal: Negative for nausea, vomiting and abdominal pain.  Skin: Negative for rash.    Allergies  Penicillins and Sulfa antibiotics  Home Medications   Current Outpatient Rx  Name Route Sig Dispense Refill  . ALBUTEROL 90 MCG/ACT IN AERS Inhalation Inhale 2 puffs into the lungs 3 (three) times daily as needed. For shortness of breath     . ASPIRIN 81 MG PO TABS Oral Take 81 mg by mouth daily.      . ATORVASTATIN CALCIUM 20 MG PO TABS Oral Take 20 mg by mouth at bedtime.     [name] CETIRIZINE HCL 10 MG PO TABS Oral Take 10 mg by mouth daily.    [name] CLONIDINE HCL 0.1 MG PO TABS Oral Take 0.1 mg by mouth 3 (three) times daily.     . CYCLOBENZAPRINE HCL 10 MG PO TABS Oral Take 10 mg by mouth daily.      [name] DIAZEPAM 5 MG PO TABS Oral Take 5 mg by mouth at bedtime.      [name] VICODIN PO Oral Take 1 tablet by mouth every 4 (four) hours as  needed. For pain    . IBUPROFEN PO Oral Take 1 tablet by mouth every 4 (four) hours as needed. For pain    . ISOSORBIDE DINITRATE 20 MG PO TABS Oral Take 20 mg by mouth 2 (two) times daily.     [name] LISINOPRIL-HYDROCHLOROTHIAZIDE 20-12.5 MG PO TABS Oral Take 1 tablet by mouth daily.      [name] METFORMIN HCL 1000 MG PO TABS Oral Take 1,000 mg by mouth 2 (two) times daily with a meal.    . MULTI-VITAMIN/MINERALS PO TABS Oral Take 1 tablet by mouth daily.      [name] NITROGLYCERIN 0.4 MG SL SUBL Sublingual Place 0.4 mg under the tongue every 5 (five) minutes as needed. For chest pain.    [name] OMEPRAZOLE 20 MG PO CPDR Oral Take 20 mg by mouth daily.      [name] POTASSIUM CHLORIDE CRYS ER 10 MEQ PO TBCR Oral Take 10 mEq by mouth 2 (two) times daily.    . SERTRALINE HCL 100 MG PO TABS Oral Take 100 mg by mouth daily.       BP 120/67  Pulse 77  Temp 98.1 F (36.7 C)  Resp 22  SpO2 98%  Physical Exam  Constitutional: She  is oriented to person, place, and time. She appears well-developed and well-nourished. No distress.  HENT:  Head: Normocephalic.  Neck: Normal range of motion.  Cardiovascular: Normal rate and regular rhythm.   No murmur heard. Pulmonary/Chest: Effort normal and breath sounds normal. She has no wheezes. She has no rales. She exhibits no tenderness.  Abdominal: Soft. Bowel sounds are normal. There is no tenderness. There is no rebound and no guarding.  Musculoskeletal: Normal range of motion. She exhibits no edema.  Neurological: She is alert and oriented to person, place, and time.  Skin: Skin is warm and dry. No rash noted.  Psychiatric: She has a normal mood and affect.    ED Course  Procedures (including critical care time)  Labs Reviewed  POCT I-STAT, CHEM 8 - Abnormal; Notable for the following:    Potassium 3.4 (*)     Glucose, Bld 111 (*)     All other components within normal limits  POCT I-STAT TROPONIN I   Dg Chest 2 View  12/02/2011  *RADIOLOGY REPORT*  Clinical Data: Chest pain, shortness of breath and cough.  CHEST - 2 VIEW  Comparison: Chest x-ray 11/20/2011.  Findings: Lung volumes are normal.  No consolidative airspace disease.  No pleural effusions.  No pneumothorax.  No pulmonary nodule or mass noted.  Pulmonary vasculature and the cardiomediastinal silhouette are within normal limits.  IMPRESSION: 1. No radiographic evidence of acute cardiopulmonary disease.  Original Report Authenticated By: Florencia Reasons, M.D.     No diagnosis found.    MDM  The patient has a reported history of PE. However, she appears comfortable here, is not tachycardic or hypoxic. She has been seen multiple times in the emergency department for the same symptoms. VSS. Do not suspect PE or CAD based on presentation, exam and labs. The patient reports being out of her vicodin that is given to her for "pain". Will discharge home.         Rodena Medin, PA-C 12/02/11 1319

## 2011-12-20 IMAGING — CR DG CHEST 2V
2 series · 2 of 2 positions shown · non-contrast
Comparison: [DATE].

CLINICAL DATA: Chest pain and shortness of breath.  Cough.

CHEST - 2 VIEW

[view not recorded (1 of 2)]
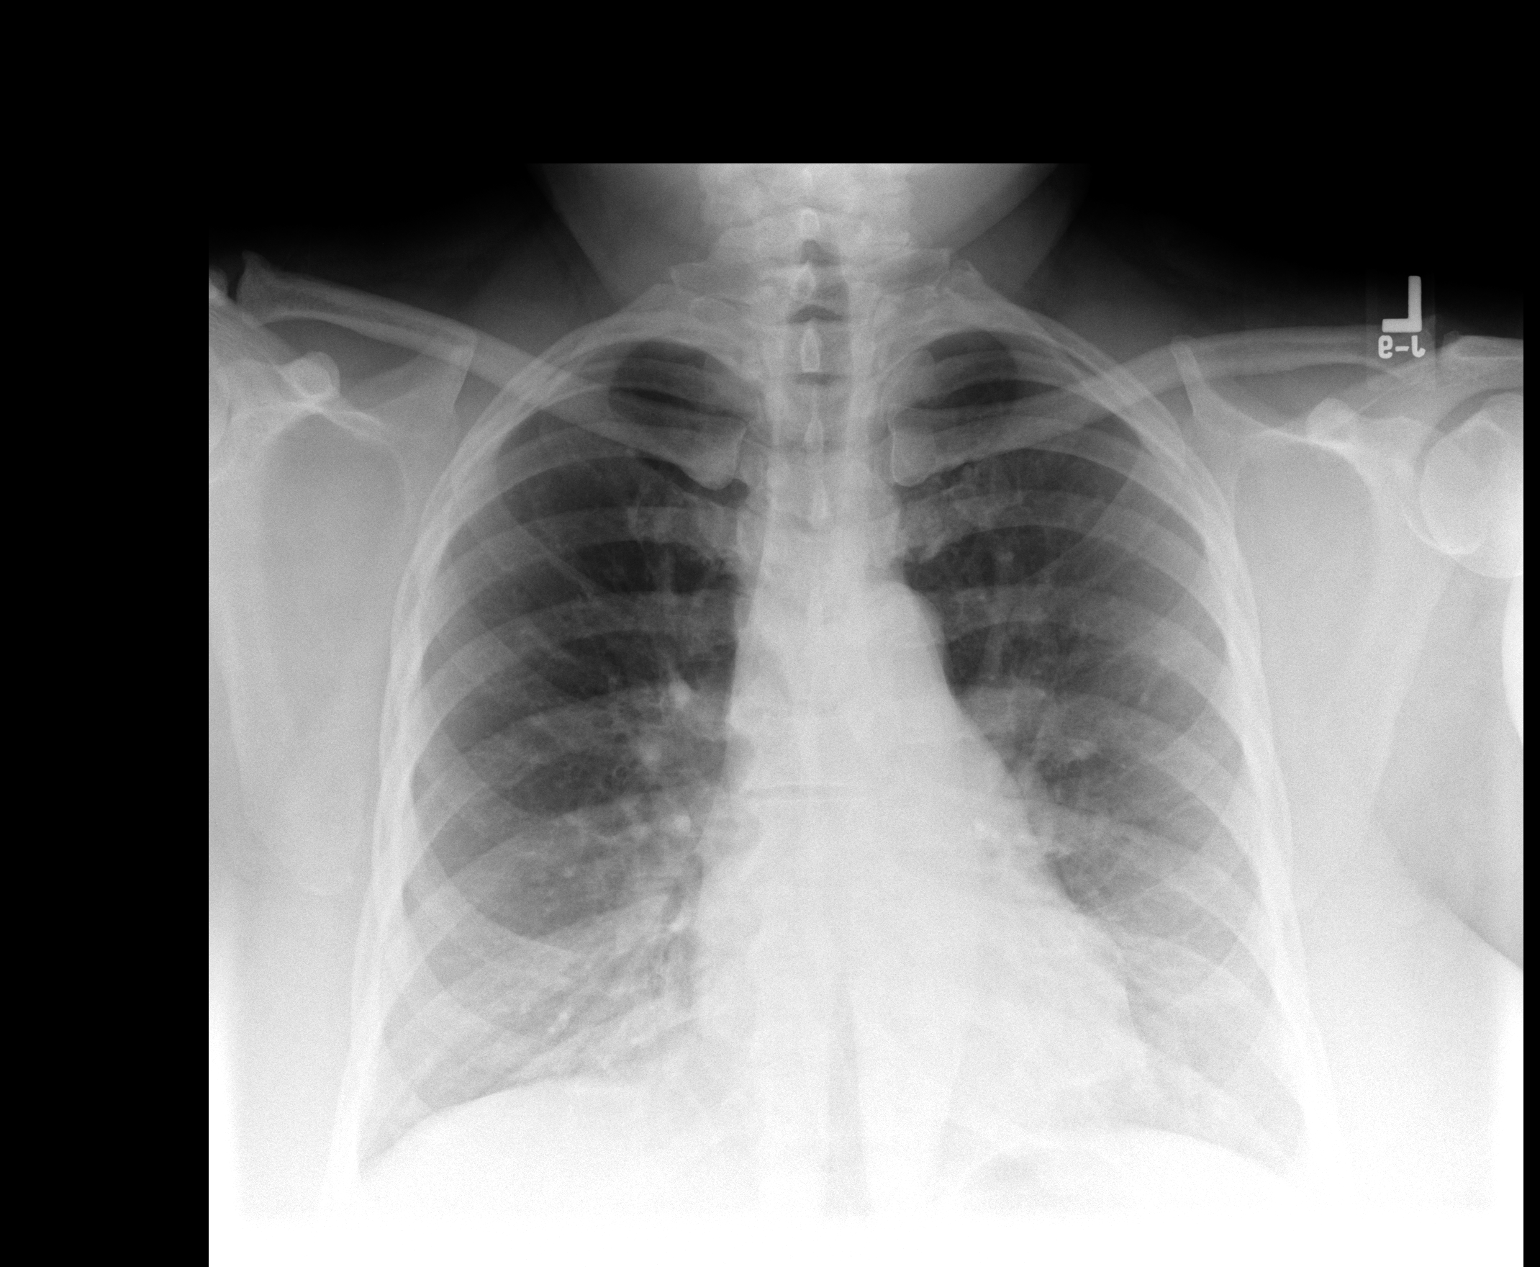

[view not recorded (2 of 2)]
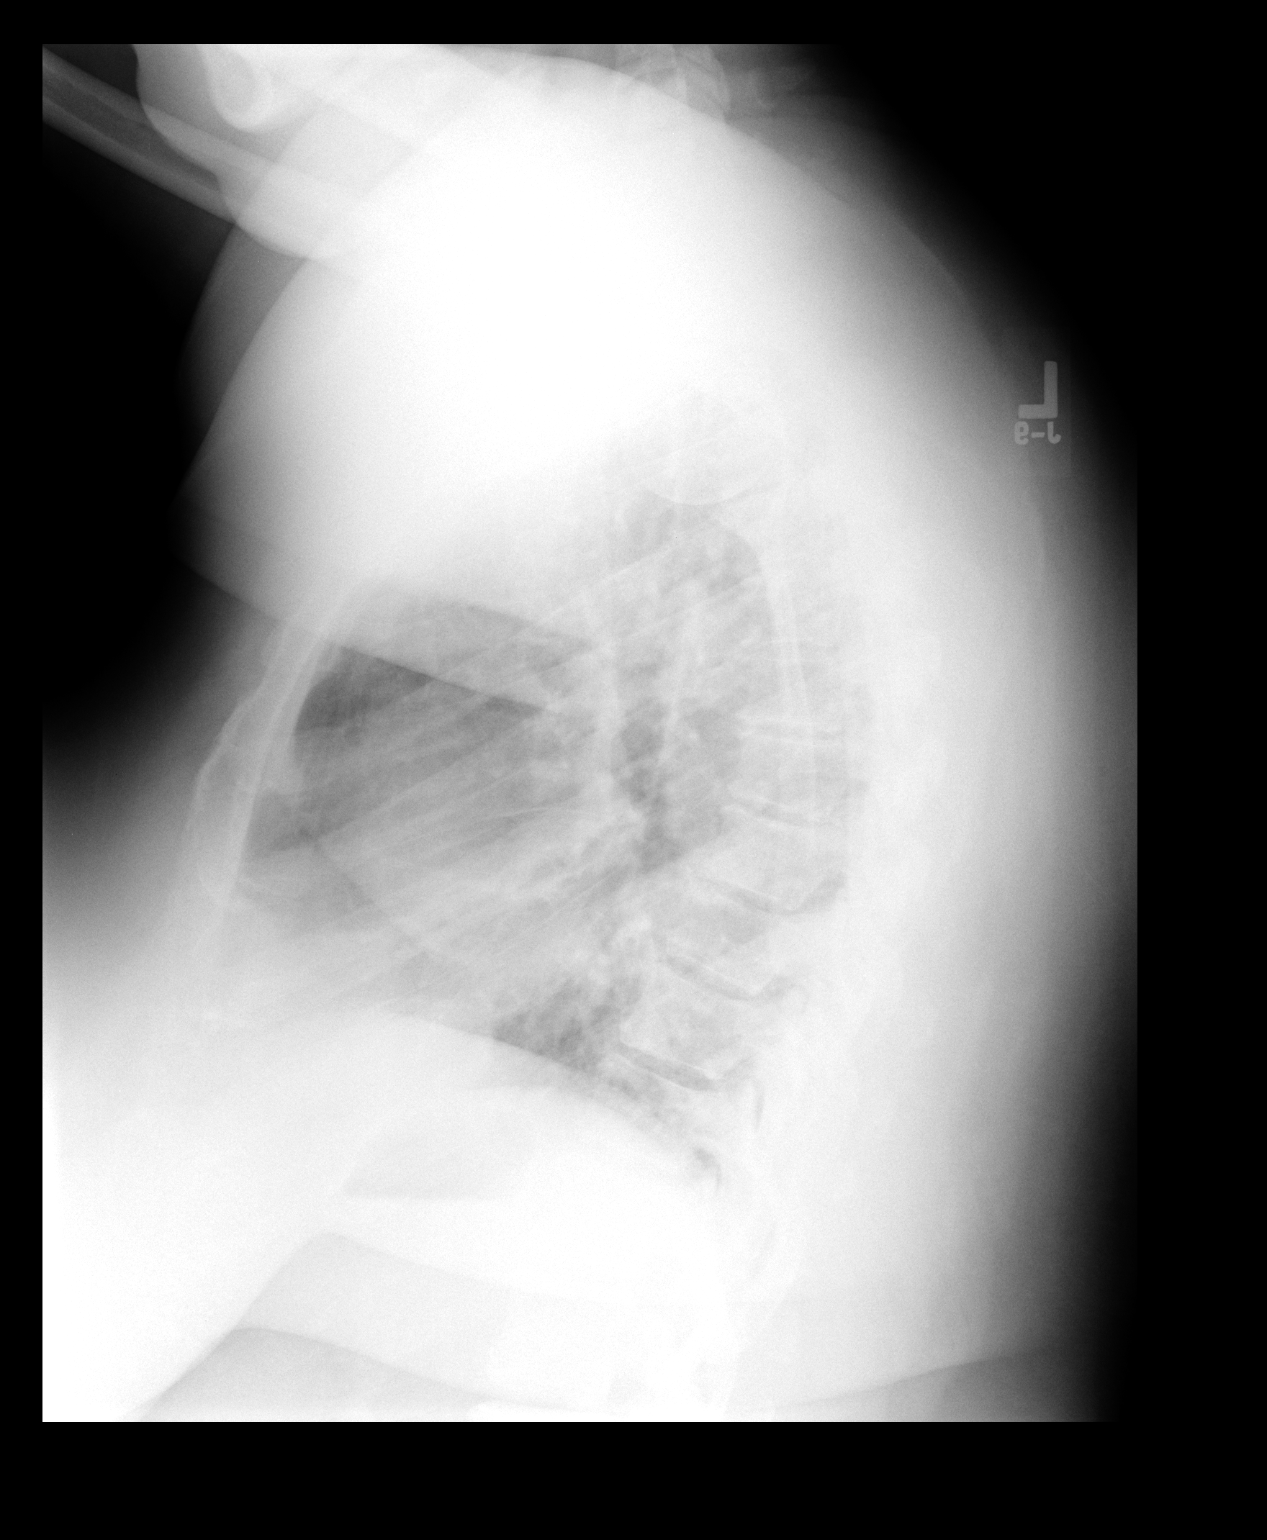

[2 of 2 positions shown; findings below may reference images not displayed]

FINDINGS: There are mildly accentuated bronchial markings
consistent change of bronchitis.  There may be a slight infiltrate
within the medial right lung base.  Left lung is clear.  The heart
is borderline in size.  There is no evidence for mediastinal or
hilar adenopathy.
IMPRESSION: Bronchitic changes.  Question subtle infiltrate within the medial
right lung base.  Otherwise, negative study.

## 2012-01-08 ENCOUNTER — Encounter (HOSPITAL_COMMUNITY): Payer: Self-pay | Admitting: Emergency Medicine

## 2012-01-08 ENCOUNTER — Inpatient Hospital Stay (HOSPITAL_COMMUNITY)
Admission: EM | Admit: 2012-01-08 | Discharge: 2012-01-11 | DRG: 292 | Disposition: A | Discharge status: Home / Self Care | Payer: Medicare Other | Attending: Internal Medicine | Admitting: Internal Medicine

## 2012-01-08 ENCOUNTER — Emergency Department (HOSPITAL_COMMUNITY): Payer: Medicare Other

## 2012-01-08 ENCOUNTER — Emergency Department (INDEPENDENT_AMBULATORY_CARE_PROVIDER_SITE_OTHER)
Admission: EM | Admit: 2012-01-08 | Discharge: 2012-01-08 | Disposition: A | Discharge status: Nursing Home (Includes ICF) | Payer: Medicare Other | Source: Home / Self Care | Attending: Family Medicine | Admitting: Family Medicine

## 2012-01-08 DIAGNOSIS — R072 Precordial pain: Secondary | ICD-10-CM | POA: Diagnosis present

## 2012-01-08 DIAGNOSIS — M129 Arthropathy, unspecified: Secondary | ICD-10-CM

## 2012-01-08 DIAGNOSIS — R0600 Dyspnea, unspecified: Secondary | ICD-10-CM

## 2012-01-08 DIAGNOSIS — Z79899 Other long term (current) drug therapy: Secondary | ICD-10-CM

## 2012-01-08 DIAGNOSIS — I5030 Unspecified diastolic (congestive) heart failure: Secondary | ICD-10-CM

## 2012-01-08 DIAGNOSIS — Z86711 Personal history of pulmonary embolism: Secondary | ICD-10-CM

## 2012-01-08 DIAGNOSIS — A530 Latent syphilis, unspecified as early or late: Secondary | ICD-10-CM

## 2012-01-08 DIAGNOSIS — R222 Localized swelling, mass and lump, trunk: Secondary | ICD-10-CM

## 2012-01-08 DIAGNOSIS — F3289 Other specified depressive episodes: Secondary | ICD-10-CM | POA: Diagnosis present

## 2012-01-08 DIAGNOSIS — E119 Type 2 diabetes mellitus without complications: Secondary | ICD-10-CM | POA: Diagnosis present

## 2012-01-08 DIAGNOSIS — Z6841 Body Mass Index (BMI) 40.0 and over, adult: Secondary | ICD-10-CM

## 2012-01-08 DIAGNOSIS — J45909 Unspecified asthma, uncomplicated: Secondary | ICD-10-CM | POA: Diagnosis present

## 2012-01-08 DIAGNOSIS — Z86718 Personal history of other venous thrombosis and embolism: Secondary | ICD-10-CM

## 2012-01-08 DIAGNOSIS — G5600 Carpal tunnel syndrome, unspecified upper limb: Secondary | ICD-10-CM

## 2012-01-08 DIAGNOSIS — Z7982 Long term (current) use of aspirin: Secondary | ICD-10-CM

## 2012-01-08 DIAGNOSIS — I509 Heart failure, unspecified: Secondary | ICD-10-CM | POA: Diagnosis present

## 2012-01-08 DIAGNOSIS — F411 Generalized anxiety disorder: Secondary | ICD-10-CM | POA: Diagnosis present

## 2012-01-08 DIAGNOSIS — I959 Hypotension, unspecified: Secondary | ICD-10-CM | POA: Diagnosis present

## 2012-01-08 DIAGNOSIS — R079 Chest pain, unspecified: Secondary | ICD-10-CM | POA: Diagnosis present

## 2012-01-08 DIAGNOSIS — Z87891 Personal history of nicotine dependence: Secondary | ICD-10-CM

## 2012-01-08 DIAGNOSIS — T465X5A Adverse effect of other antihypertensive drugs, initial encounter: Secondary | ICD-10-CM | POA: Diagnosis present

## 2012-01-08 DIAGNOSIS — I5033 Acute on chronic diastolic (congestive) heart failure: Principal | ICD-10-CM | POA: Diagnosis present

## 2012-01-08 DIAGNOSIS — I9589 Other hypotension: Secondary | ICD-10-CM | POA: Diagnosis present

## 2012-01-08 DIAGNOSIS — E785 Hyperlipidemia, unspecified: Secondary | ICD-10-CM | POA: Diagnosis present

## 2012-01-08 DIAGNOSIS — I10 Essential (primary) hypertension: Secondary | ICD-10-CM

## 2012-01-08 DIAGNOSIS — J9859 Other diseases of mediastinum, not elsewhere classified: Secondary | ICD-10-CM | POA: Diagnosis present

## 2012-01-08 DIAGNOSIS — R0609 Other forms of dyspnea: Secondary | ICD-10-CM

## 2012-01-08 DIAGNOSIS — M19019 Primary osteoarthritis, unspecified shoulder: Secondary | ICD-10-CM | POA: Diagnosis present

## 2012-01-08 DIAGNOSIS — Z23 Encounter for immunization: Secondary | ICD-10-CM

## 2012-01-08 DIAGNOSIS — Z880 Allergy status to penicillin: Secondary | ICD-10-CM

## 2012-01-08 DIAGNOSIS — H409 Unspecified glaucoma: Secondary | ICD-10-CM

## 2012-01-08 DIAGNOSIS — I5032 Chronic diastolic (congestive) heart failure: Secondary | ICD-10-CM | POA: Diagnosis present

## 2012-01-08 DIAGNOSIS — Z90710 Acquired absence of both cervix and uterus: Secondary | ICD-10-CM

## 2012-01-08 DIAGNOSIS — G4733 Obstructive sleep apnea (adult) (pediatric): Secondary | ICD-10-CM | POA: Diagnosis present

## 2012-01-08 DIAGNOSIS — F329 Major depressive disorder, single episode, unspecified: Secondary | ICD-10-CM | POA: Diagnosis present

## 2012-01-08 DIAGNOSIS — D150 Benign neoplasm of thymus: Secondary | ICD-10-CM | POA: Diagnosis present

## 2012-01-08 DIAGNOSIS — E7800 Pure hypercholesterolemia, unspecified: Secondary | ICD-10-CM | POA: Diagnosis present

## 2012-01-08 DIAGNOSIS — M5137 Other intervertebral disc degeneration, lumbosacral region: Secondary | ICD-10-CM

## 2012-01-08 DIAGNOSIS — E669 Obesity, unspecified: Secondary | ICD-10-CM | POA: Diagnosis present

## 2012-01-08 DIAGNOSIS — E049 Nontoxic goiter, unspecified: Secondary | ICD-10-CM | POA: Diagnosis present

## 2012-01-08 DIAGNOSIS — K589 Irritable bowel syndrome without diarrhea: Secondary | ICD-10-CM

## 2012-01-08 DIAGNOSIS — Z882 Allergy status to sulfonamides status: Secondary | ICD-10-CM

## 2012-01-08 HISTORY — DX: Obesity, unspecified: E66.9

## 2012-01-08 HISTORY — DX: Other diseases of mediastinum, not elsewhere classified: J98.59

## 2012-01-08 LAB — CBC
HCT: 35.3 % — ABNORMAL LOW (ref 36.0–46.0)
HCT: 36.2 % (ref 36.0–46.0)
Hemoglobin: 11.7 g/dL — ABNORMAL LOW (ref 12.0–15.0)
Hemoglobin: 11.8 g/dL — ABNORMAL LOW (ref 12.0–15.0)
MCH: 28.7 pg (ref 26.0–34.0)
MCH: 28.3 pg (ref 26.0–34.0)
MCHC: 33.1 g/dL (ref 30.0–36.0)
MCHC: 32.6 g/dL (ref 30.0–36.0)
MCV: 86.5 fL (ref 78.0–100.0)
MCV: 86.8 fL (ref 78.0–100.0)
Platelets: 229 10*3/uL (ref 150–400)
Platelets: 228 10*3/uL (ref 150–400)
RBC: 4.08 MIL/uL (ref 3.87–5.11)
RBC: 4.17 MIL/uL (ref 3.87–5.11)
RDW: 13.2 % (ref 11.5–15.5)
RDW: 13.3 % (ref 11.5–15.5)
WBC: 7 10*3/uL (ref 4.0–10.5)
WBC: 6.8 10*3/uL (ref 4.0–10.5)

## 2012-01-08 LAB — CREATININE, SERUM
Creatinine, Ser: 0.84 mg/dL (ref 0.50–1.10)
GFR calc Af Amer: >90 mL/min (ref >90)
GFR calc non Af Amer: 79 mL/min — ABNORMAL LOW (ref >90)

## 2012-01-08 LAB — CK TOTAL AND CKMB (NOT AT ARMC)
CK, MB: 2.2 ng/mL (ref 0.3–4.0)
Relative Index: 1.8 (ref 0.0–2.5)
Total CK: 120 U/L (ref 7–177)

## 2012-01-08 LAB — PROTIME-INR
INR: 0.95 (ref 0.00–1.49)
Prothrombin Time: 12.9 seconds (ref 11.6–15.2)

## 2012-01-08 LAB — MRSA PCR SCREENING: MRSA by PCR: NEGATIVE

## 2012-01-08 LAB — BASIC METABOLIC PANEL
BUN: 14 mg/dL (ref 6–23)
CO2: 27 mEq/L (ref 19–32)
Calcium: 9.6 mg/dL (ref 8.4–10.5)
Chloride: 103 mEq/L (ref 96–112)
Creatinine, Ser: 0.89 mg/dL (ref 0.50–1.10)
GFR calc Af Amer: 85 mL/min — ABNORMAL LOW (ref >90)
GFR calc non Af Amer: 73 mL/min — ABNORMAL LOW (ref >90)
Glucose, Bld: 121 mg/dL — ABNORMAL HIGH (ref 70–99)
Potassium: 3.4 mEq/L — ABNORMAL LOW (ref 3.5–5.1)
Sodium: 140 mEq/L (ref 135–145)

## 2012-01-08 LAB — LACTIC ACID, PLASMA: Lactic Acid, Venous: 1.8 mmol/L (ref 0.5–2.2)

## 2012-01-08 LAB — TROPONIN I
Troponin I: <0.3 ng/mL (ref <0.30)
Troponin I: <0.3 ng/mL (ref <0.30)

## 2012-01-08 LAB — GLUCOSE, CAPILLARY: Glucose-Capillary: 166 mg/dL — ABNORMAL HIGH (ref 70–99)

## 2012-01-08 LAB — PRO B NATRIURETIC PEPTIDE: Pro B Natriuretic peptide (BNP): 34.3 pg/mL (ref 0–125)

## 2012-01-08 MED ORDER — MORPHINE SULFATE 2 MG/ML IJ SOLN
2.0000 mg | INTRAMUSCULAR | Status: AC
  Administered 2012-01-08: 2 mg via INTRAVENOUS
  Filled 2012-01-08: qty 1

## 2012-01-08 MED ORDER — SODIUM CHLORIDE 0.9 % IV SOLN
INTRAVENOUS | Status: DC
  Administered 2012-01-08: 21:00:00 via INTRAVENOUS

## 2012-01-08 MED ORDER — INFLUENZA VIRUS VACC SPLIT PF IM SUSP
0.5000 mL | INTRAMUSCULAR | Status: AC
  Administered 2012-01-09: 0.5 mL via INTRAMUSCULAR
  Filled 2012-01-08: qty 0.5

## 2012-01-08 MED ORDER — SODIUM CHLORIDE 0.9 % IV SOLN
Freq: Once | INTRAVENOUS | Status: AC
  Administered 2012-01-08: 14:00:00 via INTRAVENOUS

## 2012-01-08 MED ORDER — ATORVASTATIN CALCIUM 20 MG PO TABS
20.0000 mg | ORAL_TABLET | Freq: Every day | ORAL | Status: DC
  Administered 2012-01-08 – 2012-01-10 (×3): 20 mg via ORAL
  Filled 2012-01-08 (×4): qty 1

## 2012-01-08 MED ORDER — PNEUMOCOCCAL VAC POLYVALENT 25 MCG/0.5ML IJ INJ
0.5000 mL | INJECTION | INTRAMUSCULAR | Status: AC
  Administered 2012-01-09: 0.5 mL via INTRAMUSCULAR
  Filled 2012-01-08: qty 0.5

## 2012-01-08 MED ORDER — SODIUM CHLORIDE 0.9 % IV SOLN: INTRAVENOUS | Status: DC

## 2012-01-08 MED ORDER — HYDROCODONE-ACETAMINOPHEN 5-325 MG PO TABS
1.0000 | ORAL_TABLET | Freq: Four times a day (QID) | ORAL | Status: DC | PRN
  Administered 2012-01-08 – 2012-01-11 (×7): 1 via ORAL
  Filled 2012-01-08 (×6): qty 1
  Filled 2012-01-08: qty 2

## 2012-01-08 MED ORDER — SERTRALINE HCL 100 MG PO TABS
100.0000 mg | ORAL_TABLET | Freq: Every day | ORAL | Status: DC
  Administered 2012-01-09 – 2012-01-11 (×3): 100 mg via ORAL
  Filled 2012-01-08 (×4): qty 1

## 2012-01-08 MED ORDER — ENOXAPARIN SODIUM 40 MG/0.4ML ~~LOC~~ SOLN
40.0000 mg | SUBCUTANEOUS | Status: DC
  Administered 2012-01-08 – 2012-01-10 (×3): 40 mg via SUBCUTANEOUS
  Filled 2012-01-08 (×4): qty 0.4

## 2012-01-08 MED ORDER — CLONIDINE HCL 0.1 MG PO TABS
0.1000 mg | ORAL_TABLET | Freq: Three times a day (TID) | ORAL | Status: DC | PRN
  Filled 2012-01-08: qty 1

## 2012-01-08 MED ORDER — ONDANSETRON 4 MG PO TBDP
4.0000 mg | ORAL_TABLET | Freq: Once | ORAL | Status: AC
  Administered 2012-01-08: 4 mg via ORAL

## 2012-01-08 MED ORDER — SODIUM CHLORIDE 0.9 % IV BOLUS (SEPSIS)
1000.0000 mL | Freq: Once | INTRAVENOUS | Status: AC
  Administered 2012-01-08: 1000 mL via INTRAVENOUS

## 2012-01-08 MED ORDER — ASPIRIN EC 81 MG PO TBEC
81.0000 mg | DELAYED_RELEASE_TABLET | Freq: Every day | ORAL | Status: DC
  Administered 2012-01-09 – 2012-01-11 (×3): 81 mg via ORAL
  Filled 2012-01-08 (×3): qty 1

## 2012-01-08 MED ORDER — IOHEXOL 350 MG/ML SOLN
100.0000 mL | Freq: Once | INTRAVENOUS | Status: AC | PRN
  Administered 2012-01-08: 100 mL via INTRAVENOUS

## 2012-01-08 MED ORDER — ASPIRIN 325 MG PO TABS
325.0000 mg | ORAL_TABLET | ORAL | Status: AC
  Administered 2012-01-08: 325 mg via ORAL
  Filled 2012-01-08: qty 4

## 2012-01-08 MED ORDER — ISOSORBIDE DINITRATE 20 MG PO TABS
20.0000 mg | ORAL_TABLET | Freq: Two times a day (BID) | ORAL | Status: DC
  Administered 2012-01-08 – 2012-01-11 (×6): 20 mg via ORAL
  Filled 2012-01-08 (×7): qty 1

## 2012-01-08 NOTE — ED Provider Notes (Signed)
History     CSN: [id number]  Arrival date & time 01/08/12  1414   None     Chief Complaint  Patient presents with  . Shortness of Breath    (Consider location/radiation/quality/duration/timing/severity/associated sxs/prior treatment) Patient is a 51 y.o. female presenting with shortness of breath and chest pain. The history is provided by the patient.  Shortness of Breath  The current episode started today. The onset was gradual. The problem occurs continuously. The problem has been unchanged. The problem is mild. Nothing relieves the symptoms. The symptoms are aggravated by activity. Associated symptoms include chest pain and shortness of breath. Pertinent negatives include no fever and no cough.  Chest Pain The chest pain began 3 - 5 hours ago. Chest pain occurs constantly. The chest pain is worsening. At its most intense, the pain is at 10/10. The pain is currently at 9/10. The severity of the pain is moderate. The quality of the pain is described as pressure-like. Radiates to: anterior neck. Primary symptoms include fatigue and shortness of breath. Pertinent negatives for primary symptoms include no fever, no cough, no abdominal pain, no nausea, no vomiting and no dizziness.  Associated symptoms include diaphoresis. She tried nitroglycerin and aspirin for the symptoms.     Past Medical History  Diagnosis Date  . Hypertension   . Diabetes mellitus   . Asthma   . Anginal pain   . Depression   . Anxiety   . Pulmonary embolism   . Sleep apnea     Past Surgical History  Procedure Date  . Abdominal hysterectomy 2005  . Leg surgery   . Cardiac catheterization     Normal  . Tubal ligation 1989  . Left knee surgery 2008    Family History  Problem Relation Age of Onset  . Emphysema Mother   . Asthma Brother   . Heart disease Paternal Grandfather   . Heart disease Father   . Arthritis Mother   . Colon cancer Maternal Grandfather     History  Substance Use Topics  .  Smoking status: Former Smoker -- 0.5 packs/day for 15 years    Types: Cigarettes  . Smokeless tobacco: Not on file  . Alcohol Use: No    OB History    Grav Para Term Preterm Abortions TAB SAB Ect Mult Living                  Review of Systems  Constitutional: Positive for diaphoresis and fatigue. Negative for fever.  HENT: Negative for congestion, drooling and neck pain.        Hemoptysis   Eyes: Negative for pain.  Respiratory: Positive for shortness of breath. Negative for cough.   Cardiovascular: Positive for chest pain.  Gastrointestinal: Negative for nausea, vomiting, abdominal pain and diarrhea.  Genitourinary: Negative for dysuria and hematuria.  Musculoskeletal: Negative for back pain and gait problem.  Skin: Negative for color change.  Neurological: Positive for light-headedness. Negative for dizziness and headaches.  Hematological: Negative for adenopathy.  Psychiatric/Behavioral: Negative for behavioral problems.  All other systems reviewed and are negative.    Allergies  Penicillins and Sulfa antibiotics  Home Medications   Current Outpatient Rx  Name Route Sig Dispense Refill  . ALBUTEROL 90 MCG/ACT IN AERS Inhalation Inhale 2 puffs into the lungs 3 (three) times daily as needed. For shortness of breath     . ASPIRIN 81 MG PO TABS Oral Take 81 mg by mouth daily.      [name]  ATORVASTATIN CALCIUM 20 MG PO TABS Oral Take 20 mg by mouth at bedtime.     [name] CLONIDINE HCL 0.1 MG PO TABS Oral Take 0.1 mg by mouth 3 (three) times daily.     . CYCLOBENZAPRINE HCL 10 MG PO TABS Oral Take 10 mg by mouth daily.      [name] DIAZEPAM 5 MG PO TABS Oral Take 5 mg by mouth at bedtime.      [name] HYDROCODONE-ACETAMINOPHEN 5-325 MG PO TABS Oral Take 1 tablet by mouth every 6 (six) hours as needed. For pain    . IBUPROFEN 200 MG PO TABS Oral Take 200 mg by mouth every 6 (six) hours as needed. For pain    . ISOSORBIDE DINITRATE 20 MG PO TABS Oral Take 20 mg by mouth 2 (two) times daily.     [name]  LISINOPRIL-HYDROCHLOROTHIAZIDE 20-12.5 MG PO TABS Oral Take 1 tablet by mouth daily.      [name] LORATADINE 10 MG PO TABS Oral Take 10 mg by mouth daily.    [name] METFORMIN HCL 1000 MG PO TABS Oral Take 1,000 mg by mouth 2 (two) times daily with a meal.    . MULTI-VITAMIN/MINERALS PO TABS Oral Take 1 tablet by mouth daily.      [name] NITROGLYCERIN 0.4 MG SL SUBL Sublingual Place 0.4 mg under the tongue every 5 (five) minutes as needed. For chest pain.    [name] OMEPRAZOLE 20 MG PO CPDR Oral Take 20 mg by mouth daily.      [name] POTASSIUM CHLORIDE CRYS ER 10 MEQ PO TBCR Oral Take 10 mEq by mouth 2 (two) times daily.    [name] PROBIOTIC DAILY PO Oral Take 1 capsule by mouth daily.    . SERTRALINE HCL 100 MG PO TABS Oral Take 100 mg by mouth daily.       BP 115/75  Pulse 60  Temp 98.2 F (36.8 C) (Oral)  Resp 14  SpO2 99%  Physical Exam  Nursing note and vitals reviewed. Constitutional: She is oriented to person, place, and time. She appears well-developed and well-nourished.  HENT:  Head: Normocephalic.  Mouth/Throat: Oropharynx is clear and moist. No oropharyngeal exudate.  Eyes: Conjunctivae normal and EOM are normal. Pupils are equal, round, and reactive to light.  Neck: Normal range of motion. Neck supple.  Cardiovascular: Normal rate, regular rhythm, normal heart sounds and intact distal pulses.  Exam reveals no gallop and no friction rub.   No murmur heard. Pulmonary/Chest: Effort normal and breath sounds normal. No respiratory distress. She has no wheezes.  Abdominal: Soft. Bowel sounds are normal. There is no tenderness. There is no rebound and no guarding.  Musculoskeletal: Normal range of motion. She exhibits no edema and no tenderness.  Neurological: She is alert and oriented to person, place, and time.  Skin: Skin is warm and dry.  Psychiatric: She has a normal mood and affect. Her behavior is normal.    ED Course  Procedures (including critical care time)  Labs Reviewed  CBC - Abnormal;  Notable for the following:    Hemoglobin 11.7 (*)     HCT 35.3 (*)     All other components within normal limits  BASIC METABOLIC PANEL  PRO B NATRIURETIC PEPTIDE  TROPONIN I  PROTIME-INR  LACTIC ACID, PLASMA   Dg Chest Port 1 View  01/08/2012  *RADIOLOGY REPORT*  Clinical Data: Shortness of breath, hypertension, diabetes, former smoker, asthma, COPD  PORTABLE CHEST - 1 VIEW  Comparison: Portable exam 1435  hours compared to 12/02/2011  Findings: Enlargement of cardiac silhouette. Mediastinal contours and pulmonary vascularity normal. Right basilar atelectasis. Remaining lungs grossly clear. No pleural effusion or pneumothorax. Numerous cardiac monitoring leads project over chest.  IMPRESSION: Enlargement of cardiac silhouette. Right basilar atelectasis.   Original Report Authenticated By: Lollie Marrow, M.D.      No diagnosis found.   Date: 01/08/2012  Rate: 77  Rhythm: normal sinus rhythm  QRS Axis: normal  Intervals: normal  ST/T Wave abnormalities: nonspecific T wave changes  Conduction Disutrbances:none  Narrative Interpretation: isolated slightly inverted t wave in V6, likely lead position  Old EKG Reviewed: changes noted    MDM  3:10 PM 51 y.o. female w hx of HTN, DM, Asthma, PE pw chest pain that began at noon today while walking. Pt notes pain as pressure that persisted. Also notes hemoptysis 5-6 days ago x 1. Pt mildly hypotensive here w/ BP 90's/50's, HR 70's, 96% on RA. Ddx includes PE, MI, PNA. Will get labs, IVF, pain control.    CT PE study negative. Pt has persistent hypotension w/ sys bp in 80-90's despite 2L IVF. Will admit to hospitalist.   Clinical Impression 1. Chest pain   2. Hypotension   3. DOE (dyspnea on exertion)   4. Mediastinal abnormality         Purvis Sheffield, MD 01/09/12 [phone]

## 2012-01-08 NOTE — ED Notes (Signed)
Dr Oletta Lamas aware of pt's BP

## 2012-01-08 NOTE — ED Notes (Signed)
Pt given turkey sandwich and juice.  

## 2012-01-08 NOTE — ED Notes (Signed)
[name] spoke to [place] in ed

## 2012-01-08 NOTE — ED Notes (Signed)
Harriett Sine notified [facility]

## 2012-01-08 NOTE — H&P (Signed)
PCP:   Norberto Sorenson, MD   Chief Complaint:  Chest pain  HPI: 51 y/o female who has been admitted multiple times in the past for recurrent chest pains, came to hospital after she developed chest pain while walking to church around 11 am.Patient says she felt dizzy and weak, with chest pain and shortness of breath. Patient admits to having nausea but denies vomiting. CT angio was done in the ER which is negative for pulmonary embolism. Patient is found to have anterior mediastinal mass, which has grown in size from 2008.  Allergies:   Allergies  Allergen Reactions  . Penicillins Swelling  . Sulfa Antibiotics Rash      Past Medical History  Diagnosis Date  . Hypertension   . Diabetes mellitus   . Asthma   . Anginal pain   . Depression   . Anxiety   . Pulmonary embolism   . Sleep apnea     Past Surgical History  Procedure Date  . Abdominal hysterectomy 2005  . Leg surgery   . Cardiac catheterization     Normal  . Tubal ligation 1989  . Left knee surgery 2008    Prior to Admission medications   Medication Sig Start Date End Date Taking? Authorizing Provider  albuterol (PROVENTIL,VENTOLIN) 90 MCG/ACT inhaler Inhale 2 puffs into the lungs 3 (three) times daily as needed. For shortness of breath    Yes Historical Provider, MD  aspirin 81 MG tablet Take 81 mg by mouth daily.     Yes Historical Provider, MD  atorvastatin (LIPITOR) 20 MG tablet Take 20 mg by mouth at bedtime.    Yes Historical Provider, MD  cloNIDine (CATAPRES) 0.1 MG tablet Take 0.1 mg by mouth 3 (three) times daily.    Yes Historical Provider, MD  cyclobenzaprine (FLEXERIL) 10 MG tablet Take 10 mg by mouth daily.     Yes Historical Provider, MD  diazepam (VALIUM) 5 MG tablet Take 5 mg by mouth at bedtime.     Yes Historical Provider, MD  HYDROcodone-acetaminophen (NORCO/VICODIN) 5-325 MG per tablet Take 1 tablet by mouth every 6 (six) hours as needed. For pain   Yes Historical Provider, MD  ibuprofen  (ADVIL,MOTRIN) 200 MG tablet Take 200 mg by mouth every 6 (six) hours as needed. For pain   Yes Historical Provider, MD  isosorbide dinitrate (ISORDIL) 20 MG tablet Take 20 mg by mouth 2 (two) times daily.    Yes Historical Provider, MD  lisinopril-hydrochlorothiazide (PRINZIDE,ZESTORETIC) 20-12.5 MG per tablet Take 1 tablet by mouth daily.     Yes Historical Provider, MD  loratadine (CLARITIN) 10 MG tablet Take 10 mg by mouth daily.   Yes Historical Provider, MD  metFORMIN (GLUCOPHAGE) 1000 MG tablet Take 1,000 mg by mouth 2 (two) times daily with a meal.   Yes Historical Provider, MD  Multiple Vitamins-Minerals (MULTIVITAMIN WITH MINERALS) tablet Take 1 tablet by mouth daily.     Yes Historical Provider, MD  nitroGLYCERIN (NITROSTAT) 0.4 MG SL tablet Place 0.4 mg under the tongue every 5 (five) minutes as needed. For chest pain.   Yes Historical Provider, MD  omeprazole (PRILOSEC) 20 MG capsule Take 20 mg by mouth daily.     Yes Historical Provider, MD  potassium chloride (K-DUR,KLOR-CON) 10 MEQ tablet Take 10 mEq by mouth 2 (two) times daily.   Yes Historical Provider, MD  Probiotic Product (PROBIOTIC DAILY PO) Take 1 capsule by mouth daily.   Yes Historical Provider, MD  sertraline (ZOLOFT) 100 MG tablet Take  100 mg by mouth daily.    Yes Historical Provider, MD    Social History:  reports that she has quit smoking. Her smoking use included Cigarettes. She has a 7.5 pack-year smoking history. She does not have any smokeless tobacco history on file. She reports that she does not drink alcohol or use illicit drugs.  Family History  Problem Relation Age of Onset  . Emphysema Mother   . Asthma Brother   . Heart disease Paternal Grandfather   . Heart disease Father   . Arthritis Mother   . Colon cancer Maternal Grandfather     Review of Systems:  HEENT: Denies headache, blurred vision, runny nose, sore throat,  Neck: History of goiter, mediastinal mass Chest : see hPI Heart : Denies h/o    coronary arterey disease GI: Denies  nausea, vomiting, diarrhea, constipation GU: Denies dysuria, urgency, frequency of urination, hematuria Neuro: Denies stroke, seizures, syncope Psych: positive h/o panic attacks   Physical Exam: Blood pressure 86/42, pulse 74, temperature 98.2 F (36.8 C), temperature source Oral, resp. rate 18, SpO2 98.00%. Constitutional:   Patient is a well-developed and well-nourished female  in no acute distress and cooperative with exam. Head: Normocephalic and atraumatic Mouth: Mucus membranes moist Eyes: PERRL, EOMI, conjunctivae normal Neck: Supple, No Thyromegaly Cardiovascular: RRR, S1 normal, S2 normal Pulmonary/Chest: CTAB, no wheezes, rales, or rhonchi Abdominal: Soft. Non-tender, non-distended, bowel sounds are normal, no masses, organomegaly, or guarding present.  Neurological: A&O x3, Strenght is normal and symmetric bilaterally, cranial nerve II-XII are grossly intact, no focal motor deficit, sensory intact to light touch bilaterally.  Extremities : No Cyanosis, Clubbing or Edema   Labs on Admission:  Results for orders placed during the hospital encounter of 01/08/12 (from the past 48 hour(s))  CBC     Status: Abnormal   Collection Time   01/08/12  2:35 PM      Component Value Range Comment   WBC 7.0  4.0 - 10.5 K/uL    RBC 4.08  3.87 - 5.11 MIL/uL    Hemoglobin 11.7 (*) 12.0 - 15.0 g/dL    HCT 09.8 (*) 11.9 - 46.0 %    MCV 86.5  78.0 - 100.0 fL    MCH 28.7  26.0 - 34.0 pg    MCHC 33.1  30.0 - 36.0 g/dL    RDW 14.7  82.9 - 56.2 %    Platelets 229  150 - 400 K/uL   BASIC METABOLIC PANEL     Status: Abnormal   Collection Time   01/08/12  2:35 PM      Component Value Range Comment   Sodium 140  135 - 145 mEq/L    Potassium 3.4 (*) 3.5 - 5.1 mEq/L    Chloride 103  96 - 112 mEq/L    CO2 27  19 - 32 mEq/L    Glucose, Bld 121 (*) 70 - 99 mg/dL    BUN 14  6 - 23 mg/dL    Creatinine, Ser 1.30  0.50 - 1.10 mg/dL    Calcium 9.6  8.4 - 86.5  mg/dL    GFR calc non Af Amer 73 (*) >90 mL/min    GFR calc Af Amer 85 (*) >90 mL/min   PRO B NATRIURETIC PEPTIDE     Status: Normal   Collection Time   01/08/12  2:35 PM      Component Value Range Comment   Pro B Natriuretic peptide (BNP) 34.3  0 - 125 pg/mL  TROPONIN I     Status: Normal   Collection Time   01/08/12  2:35 PM      Component Value Range Comment   Troponin I <0.30  <0.30 ng/mL   PROTIME-INR     Status: Normal   Collection Time   01/08/12  3:21 PM      Component Value Range Comment   Prothrombin Time 12.9  11.6 - 15.2 seconds    INR 0.95  0.00 - 1.49   LACTIC ACID, PLASMA     Status: Normal   Collection Time   01/08/12  3:21 PM      Component Value Range Comment   Lactic Acid, Venous 1.8  0.5 - 2.2 mmol/L     Radiological Exams on Admission: Ct Angio Chest Pe W/cm &/or Wo Cm  01/08/2012  *RADIOLOGY REPORT*  Clinical Data: Chest tightness.  Shortness of breath.  CT ANGIOGRAPHY CHEST  Technique:  Multidetector CT imaging of the chest using the standard protocol during bolus administration of intravenous contrast. Multiplanar reconstructed images including MIPs were obtained and reviewed to evaluate the vascular anatomy.  Contrast: OMNIPAQUE IOHEXOL 350 MG/ML SOLN  Comparison: One view chest 01/08/2012.  C t a chest 06/06/2011.  Findings: Pulmonary arterial opacification is satisfactory.  The study is mildly degraded by patient motion and body habitus.  No focal filling defects are evident to suggest pulmonary embolus.  The heart size is normal.  No significant pleural or pericardial effusion is present.  A prominent thyroid goiter is stable.  An anterior mediastinal mass is slowly growing over time when compared with studies from 2008. Mild diffuse ground-glass attenuation is compatible with edema or atelectasis.  There is dependent atelectasis bilaterally as well. No focal nodule mass or airspace consolidation is present otherwise.  The bone windows demonstrate mild  degenerative changes.  No focal lytic or blastic lesions are evident.  IMPRESSION:  1.  No evidence of pulmonary embolus. 2.  Low lung volumes with edema or atelectasis. 3.  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   Original Report Authenticated By: Jamesetta Orleans. MATTERN, M.D.    Dg Chest Port 1 View  01/08/2012  *RADIOLOGY REPORT*  Clinical Data: Shortness of breath, hypertension, diabetes, former smoker, asthma, COPD  PORTABLE CHEST - 1 VIEW  Comparison: Portable exam 1435 hours compared to 12/02/2011  Findings: Enlargement of cardiac silhouette. Mediastinal contours and pulmonary vascularity normal. Right basilar atelectasis. Remaining lungs grossly clear. No pleural effusion or pneumothorax. Numerous cardiac monitoring leads project over chest.  IMPRESSION: Enlargement of cardiac silhouette. Right basilar atelectasis.   Original Report Authenticated By: Lollie Marrow, M.D.     Assessment/Plan Active Problems:  Hypotension  Chest pain  Hypotension Probably secondary to multiple antihypertensive medications. She is on clonidine 0.1 mg po tid, lisinopril/HCTZ, isosorbide. Will hold lsinopril/hctz at this time and make catapres 0.1 mg po Q 8 hr prn.  Chest pain EKG shows nonspecific changes in the lateral leads, has seen cardiology in past.Will admit to step down unit, will follow three sets of cardiac enzymes.Will get cardiology consultation.  Mediastinal mass ? Thymoma, it has grown in size from 2008.  If chest pain is not cardiac then it may be causing symptoms,  Consider CT surgical eval in am.  DVT prophylaxis Lovenox   Time Spent on Admission: 75 min  Alisen Marsiglia S [facility] Pager: [phone] 01/08/2012, 7:11 PM

## 2012-01-08 NOTE — ED Notes (Signed)
[facility] given report 

## 2012-01-08 NOTE — ED Provider Notes (Signed)
History     CSN: [id number]  Arrival date & time 01/08/12  1306   First MD Initiated Contact with Patient 01/08/12 1327      Chief Complaint  Patient presents with  . Chest Pain    (Consider location/radiation/quality/duration/timing/severity/associated sxs/prior treatment) Patient is a 51 y.o. female presenting with chest pain. The history is provided by the patient.  Chest Pain The chest pain began 1 - 2 hours ago. The quality of the pain is described as pressure-like and tightness. The pain radiates to the left neck. Primary symptoms include fatigue, shortness of breath and nausea. Pertinent negatives for primary symptoms include no palpitations, no vomiting and no dizziness.  Associated symptoms include diaphoresis and near-syncope.  Her past medical history is significant for diabetes.     Past Medical History  Diagnosis Date  . Hypertension   . Diabetes mellitus   . Asthma   . Anginal pain   . Depression   . Anxiety   . Pulmonary embolism   . Sleep apnea     Past Surgical History  Procedure Date  . Abdominal hysterectomy 2005  . Leg surgery   . Cardiac catheterization     Normal  . Tubal ligation 1989  . Left knee surgery 2008    Family History  Problem Relation Age of Onset  . Emphysema Mother   . Asthma Brother   . Heart disease Paternal Grandfather   . Heart disease Father   . Arthritis Mother   . Colon cancer Maternal Grandfather     History  Substance Use Topics  . Smoking status: Former Smoker -- 0.5 packs/day for 15 years    Types: Cigarettes  . Smokeless tobacco: Not on file  . Alcohol Use: No    OB History    Grav Para Term Preterm Abortions TAB SAB Ect Mult Living                  Review of Systems  Constitutional: Positive for diaphoresis and fatigue.  Respiratory: Positive for chest tightness and shortness of breath.   Cardiovascular: Positive for chest pain and near-syncope. Negative for palpitations.  Gastrointestinal:  Positive for nausea. Negative for vomiting.  Neurological: Negative for dizziness.    Allergies  Penicillins and Sulfa antibiotics  Home Medications   Current Outpatient Rx  Name Route Sig Dispense Refill  . ALBUTEROL 90 MCG/ACT IN AERS Inhalation Inhale 2 puffs into the lungs 3 (three) times daily as needed. For shortness of breath     . ASPIRIN 81 MG PO TABS Oral Take 81 mg by mouth daily.      . ATORVASTATIN CALCIUM 20 MG PO TABS Oral Take 20 mg by mouth at bedtime.     [name] CETIRIZINE HCL 10 MG PO TABS Oral Take 10 mg by mouth daily.    [name] CLONIDINE HCL 0.1 MG PO TABS Oral Take 0.1 mg by mouth 3 (three) times daily.     . CYCLOBENZAPRINE HCL 10 MG PO TABS Oral Take 10 mg by mouth daily.      [name] DIAZEPAM 5 MG PO TABS Oral Take 5 mg by mouth at bedtime.      [name] VICODIN PO Oral Take 1 tablet by mouth every 4 (four) hours as needed. For pain    . IBUPROFEN PO Oral Take 1 tablet by mouth every 4 (four) hours as needed. For pain    . ISOSORBIDE DINITRATE 20 MG PO TABS Oral Take 20 mg by mouth 2 (two)  times daily.     [name] LISINOPRIL-HYDROCHLOROTHIAZIDE 20-12.5 MG PO TABS Oral Take 1 tablet by mouth daily.      [name] METFORMIN HCL 1000 MG PO TABS Oral Take 1,000 mg by mouth 2 (two) times daily with a meal.    . MULTI-VITAMIN/MINERALS PO TABS Oral Take 1 tablet by mouth daily.      [name] NITROGLYCERIN 0.4 MG SL SUBL Sublingual Place 0.4 mg under the tongue every 5 (five) minutes as needed. For chest pain.    [name] OMEPRAZOLE 20 MG PO CPDR Oral Take 20 mg by mouth daily.      [name] POTASSIUM CHLORIDE CRYS ER 10 MEQ PO TBCR Oral Take 10 mEq by mouth 2 (two) times daily.    . SERTRALINE HCL 100 MG PO TABS Oral Take 100 mg by mouth daily.       BP 81/55  Pulse 75  Temp 98.9 F (37.2 C) (Oral)  Resp 18  SpO2 98%  Physical Exam  Nursing note and vitals reviewed. Constitutional: She is oriented to person, place, and time. She appears well-developed and well-nourished.  HENT:  Mouth/Throat: Oropharynx is clear  and moist.  Eyes: Pupils are equal, round, and reactive to light.  Neck: Normal range of motion. Neck supple.  Cardiovascular: Normal rate, regular rhythm, normal heart sounds and intact distal pulses.   Pulmonary/Chest: Effort normal and breath sounds normal. She exhibits tenderness.  Abdominal: Soft. Bowel sounds are normal.  Musculoskeletal: She exhibits no edema.  Lymphadenopathy:    She has no cervical adenopathy.  Neurological: She is alert and oriented to person, place, and time.  Skin: Skin is warm and dry.    ED Course  Procedures (including critical care time)  Labs Reviewed  GLUCOSE, CAPILLARY - Abnormal; Notable for the following:    Glucose-Capillary 166 (*)     All other components within normal limits   No results found.   1. Chest pain       MDM          Linna Hoff, MD 01/08/12 1354

## 2012-01-08 NOTE — ED Notes (Signed)
[facility] arrived  

## 2012-01-08 NOTE — ED Notes (Signed)
Patient clammy to touch, c/o feeling like she is going to pass out.  Describes chest as tight and pressure.  Associated with sob and diaphoresis.  Patient reports feeling "tired" for a four days.  Today chest pressure started around 11:30  Morning.

## 2012-01-08 NOTE — ED Notes (Signed)
[facility] transfer via [facility], developed SOB, CP while walking, has cardiac hx, was hypotensive pta, SBP 93 per [facility], 20g RAC, NAD

## 2012-01-08 NOTE — ED Notes (Signed)
Took a Second EKG to get a better reading and used second EKG

## 2012-01-08 NOTE — ED Notes (Signed)
Called 2900 unit to give report. Receiving RN unable to take report at this time. 

## 2012-01-08 NOTE — Consult Note (Signed)
Patient ID: [name] MRN: [id number], DOB/AGE: 07/29/1960   Admit date: 01/08/2012   Primary Physician: Norberto Sorenson, MD Primary Cardiologist: B. Jens Som, MD  Pt. Profile:  51 y/o female with h/o chronic chest pain who presents with recurrence.  Problem List  Past Medical History  Diagnosis Date  . Hypertension   . Diabetes mellitus   . Asthma   . Anginal pain     a. NL cath in 2008;  b. Myoview 03/2011: dec uptake along mid anterior wall on stress imaging -> ? attenuation vs. ischemia, EF 65%;  c. Echo 04/2011: EF 55-60%, no RWMA, Gr 2 dd  . Depression   . Anxiety   . Pulmonary embolism     a. 2008 -> coumadin x 6 mos.  . Sleep apnea   . 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 Obesity     Past Surgical History  Procedure Date  . Abdominal hysterectomy 2005  . Leg surgery   . Cardiac catheterization     Normal  . Tubal ligation 1989  . Left knee surgery 2008     Allergies  Allergies  Allergen Reactions  . Penicillins Swelling  . Sulfa Antibiotics Rash    HPI  51 y/o female with a several yr h/o chest pain.  She had a nl cath in 2008 and a low-risk myoview in 03/2011.  She has both rest and exertional chest pain, occurring most days of the week, described as pressure, often associated with dyspnea, lasting hours at a time and resolving with sleep.  She also has chronic fatigue and daytime somnolence - spending much of her day sleeping.  Today, she was walking at a church with a friend and developed 10/10 sscp with dyspnea and diaphoresis.  Her friend drove her to UC where Ss eased some after SL ntg, but never fully went away.  She was then taken to the [facility] ED, where pain has persisted, despite her dozing frequently.  Currently pain is 6/10.  Trop is nl.  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  ECG shows nonspecific st/t changes.  We've been asked to eval.  Pain is worse with the action of sitting of forward or  turning.  She also has chest wall tenderness, though she says this is different from pain that caused her to come into ED.  Home Medications  Prior to Admission medications   Medication Sig Start Date End Date Taking? Authorizing Provider  albuterol (PROVENTIL,VENTOLIN) 90 MCG/ACT inhaler Inhale 2 puffs into the lungs 3 (three) times daily as needed. For shortness of breath    Yes Historical Provider, MD  aspirin 81 MG tablet Take 81 mg by mouth daily.     Yes Historical Provider, MD  atorvastatin (LIPITOR) 20 MG tablet Take 20 mg by mouth at bedtime.    Yes Historical Provider, MD  cloNIDine (CATAPRES) 0.1 MG tablet Take 0.1 mg by mouth 3 (three) times daily.    Yes Historical Provider, MD  cyclobenzaprine (FLEXERIL) 10 MG tablet Take 10 mg by mouth daily.     Yes Historical Provider, MD  diazepam (VALIUM) 5 MG tablet Take 5 mg by mouth at bedtime.     Yes Historical Provider, MD  HYDROcodone-acetaminophen (NORCO/VICODIN) 5-325 MG per tablet Take 1 tablet by mouth every 6 (six) hours as needed. For pain   Yes Historical Provider, MD  ibuprofen (ADVIL,MOTRIN) 200 MG tablet Take 200 mg by  mouth every 6 (six) hours as needed. For pain   Yes Historical Provider, MD  isosorbide dinitrate (ISORDIL) 20 MG tablet Take 20 mg by mouth 2 (two) times daily.    Yes Historical Provider, MD  lisinopril-hydrochlorothiazide (PRINZIDE,ZESTORETIC) 20-12.5 MG per tablet Take 1 tablet by mouth daily.     Yes Historical Provider, MD  loratadine (CLARITIN) 10 MG tablet Take 10 mg by mouth daily.   Yes Historical Provider, MD  metFORMIN (GLUCOPHAGE) 1000 MG tablet Take 1,000 mg by mouth 2 (two) times daily with a meal.   Yes Historical Provider, MD  Multiple Vitamins-Minerals (MULTIVITAMIN WITH MINERALS) tablet Take 1 tablet by mouth daily.     Yes Historical Provider, MD  nitroGLYCERIN (NITROSTAT) 0.4 MG SL tablet Place 0.4 mg under the tongue every 5 (five) minutes as needed. For chest pain.   Yes Historical Provider,  MD  omeprazole (PRILOSEC) 20 MG capsule Take 20 mg by mouth daily.     Yes Historical Provider, MD  potassium chloride (K-DUR,KLOR-CON) 10 MEQ tablet Take 10 mEq by mouth 2 (two) times daily.   Yes Historical Provider, MD  Probiotic Product (PROBIOTIC DAILY PO) Take 1 capsule by mouth daily.   Yes Historical Provider, MD  sertraline (ZOLOFT) 100 MG tablet Take 100 mg by mouth daily.    Yes Historical Provider, MD    Family History  Family History  Problem Relation Age of Onset  . Emphysema Mother   . Asthma Brother   . Heart disease Paternal Grandfather   . Heart disease Father     died @ 22's.  . Arthritis Mother   . Colon cancer Maternal Grandfather   . Heart failure Mother     alive @ 52    Social History  History   Social History  . Marital Status: Legally Separated    Spouse Name: N/A    Number of Children: N/A  . Years of Education: N/A   Occupational History  . UNEMPLOYED    Social History Main Topics  . Smoking status: Former Smoker -- 0.5 packs/day for 15 years    Types: Cigarettes  . Smokeless tobacco: Not on file  . Alcohol Use: No  . Drug Use: No  . Sexually Active: Yes    Birth Control/ Protection: None   Other Topics Concern  . Not on file   Social History Narrative   Lives in [place] by herself.  Disabled.     Review of Systems General:  No chills, fever, night sweats or weight changes.  Chronic fatigue and daytime somnolence. Cardiovascular:  +++ chest pain lasting hrs @ a time, most days of the week.  +++ dyspnea on exertion and orthopnea.  No edema, palpitations, paroxysmal nocturnal dyspnea. Dermatological: No rash, lesions/masses Respiratory: No cough. Urologic: No hematuria, dysuria Abdominal:   No nausea, vomiting, diarrhea, bright red blood per rectum, melena, or hematemesis Neurologic:  No visual changes, wkns, changes in mental status. All other systems reviewed and are otherwise negative except as noted above.  Physical Exam  Blood  pressure 97/62, pulse 77, temperature 98.2 F (36.8 C), temperature source Oral, resp. rate 18, SpO2 98.00%.  General: Pleasant, NAD Psych: Flat affect. Neuro: Alert and oriented X 3. Moves all extremities spontaneously. HEENT: Normal  Neck: Supple without bruits.  Obese, difficult to assess JVP. Lungs:  Resp regular and unlabored, CTA. Heart: RRR no s3, s4, or murmurs. Abdomen: Chest wall pain with palpation and position changes.  Soft, non-tender, non-distended, BS + x  4.  Extremities: No clubbing, cyanosis or edema. DP/PT/Radials 2+ and equal bilaterally.  Labs   Basename 01/08/12 1435  CKTOTAL --  CKMB --  TROPONINI <0.30   Lab Results  Component Value Date   WBC 7.0 01/08/2012   HGB 11.7* 01/08/2012   HCT 35.3* 01/08/2012   MCV 86.5 01/08/2012   PLT 229 01/08/2012    Lab 01/08/12 1435  NA 140  K 3.4*  CL 103  CO2 27  BUN 14  CREATININE 0.89  CALCIUM 9.6  PROT --  BILITOT --  ALKPHOS --  ALT --  AST --  GLUCOSE 121*    Radiology/Studies  Ct Angio Chest Pe W/cm &/or Wo Cm  01/08/2012  *RADIOLOGY REPORT*  Clinical Data: Chest tightness.  Shortness of breath.  CT ANGIOGRAPHY CHEST  Technique:  Multidetector CT imaging of the chest using the standard protocol during bolus administration of intravenous contrast. Multiplanar reconstructed images including MIPs were obtained and reviewed to evaluate the vascular anatomy.  Contrast: OMNIPAQUE IOHEXOL 350 MG/ML SOLN  Comparison: One view chest 01/08/2012.  C t a chest 06/06/2011.  Findings: Pulmonary arterial opacification is satisfactory.  The study is mildly degraded by patient motion and body habitus.  No focal filling defects are evident to suggest pulmonary embolus.  The heart size is normal.  No significant pleural or pericardial effusion is present.  A prominent thyroid goiter is stable.  An anterior mediastinal mass is slowly growing over time when compared with studies from 2008. Mild diffuse ground-glass  attenuation is compatible with edema or atelectasis.  There is dependent atelectasis bilaterally as well. No focal nodule mass or airspace consolidation is present otherwise.  The bone windows demonstrate mild degenerative changes.  No focal lytic or blastic lesions are evident.  IMPRESSION:  1.  No evidence of pulmonary embolus. 2.  Low lung volumes with edema or atelectasis. 3.  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   Original Report Authenticated By: Jamesetta Orleans. MATTERN, M.D.    Dg Chest Port 1 View  01/08/2012  *RADIOLOGY REPORT*  Clinical Data: Shortness of breath, hypertension, diabetes, former smoker, asthma, COPD  PORTABLE CHEST - 1 VIEW  Comparison: Portable exam 1435 hours compared to 12/02/2011  Findings: Enlargement of cardiac silhouette. Mediastinal contours and pulmonary vascularity normal. Right basilar atelectasis. Remaining lungs grossly clear. No pleural effusion or pneumothorax. Numerous cardiac monitoring leads project over chest.  IMPRESSION: Enlargement of cardiac silhouette. Right basilar atelectasis.   Original Report Authenticated By: Lollie Marrow, M.D.     ECG  RSR, 77, nonspec st/t changes inflat leads.  ASSESSMENT AND PLAN  1.  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  ECG is nonspecific.  Despite now 8 hrs of discomfort, troponin is nl.  She had a nl cath in 2008 and low-risk MV in 12/12. If CE remain negative, we would not plan to pursue additional cardiac evaluation.  2.  HTN:  BP low currently.  On multiple meds @ home.  Follow.  3.  DM:  Per IM.   Signed, Nicolasa Ducking, NP 01/08/2012, 7:59 PM  Attending Note:   The patient was seen and examined.  Agree with assessment and plan as noted above.  Pt has these episodes of chest pain several times a week.  They last for hours at a time and are relieved if she  stops and rests.  She has had a negative cardiac evaluation in December, 2012.  She had a normal cardiac cath in 2008.    Her ECG shows some nonspecific ST changes that could be due to LVH.  She is obese and quite likely has LVH.    Her initial cardiac enzymes are negative.  If her cardiac enzymes remain negative, I do not think she needs any further cardiac evaluation.   She has obstructive sleep apnea and wears her CPAP intermittantly.  I have advised her to wear her CPAP on a regular basis.  I've also advised her to lose weight.  Vesta Mixer, Montez Hageman., MD, [facility] 01/08/2012, 8:10 PM

## 2012-01-08 NOTE — ED Notes (Signed)
Pt place on heart monitor 

## 2012-01-09 DIAGNOSIS — I5033 Acute on chronic diastolic (congestive) heart failure: Secondary | ICD-10-CM | POA: Diagnosis present

## 2012-01-09 DIAGNOSIS — R222 Localized swelling, mass and lump, trunk: Secondary | ICD-10-CM

## 2012-01-09 DIAGNOSIS — I5030 Unspecified diastolic (congestive) heart failure: Secondary | ICD-10-CM

## 2012-01-09 DIAGNOSIS — I509 Heart failure, unspecified: Secondary | ICD-10-CM

## 2012-01-09 DIAGNOSIS — J9859 Other diseases of mediastinum, not elsewhere classified: Secondary | ICD-10-CM | POA: Diagnosis present

## 2012-01-09 DIAGNOSIS — R072 Precordial pain: Secondary | ICD-10-CM

## 2012-01-09 DIAGNOSIS — I5032 Chronic diastolic (congestive) heart failure: Secondary | ICD-10-CM | POA: Diagnosis present

## 2012-01-09 LAB — BASIC METABOLIC PANEL
BUN: 14 mg/dL (ref 6–23)
CO2: 30 mEq/L (ref 19–32)
Calcium: 9 mg/dL (ref 8.4–10.5)
Chloride: 105 mEq/L (ref 96–112)
Creatinine, Ser: 0.88 mg/dL (ref 0.50–1.10)
GFR calc Af Amer: 86 mL/min — ABNORMAL LOW (ref >90)
GFR calc non Af Amer: 74 mL/min — ABNORMAL LOW (ref >90)
Glucose, Bld: 140 mg/dL — ABNORMAL HIGH (ref 70–99)
Potassium: 3.2 mEq/L — ABNORMAL LOW (ref 3.5–5.1)
Sodium: 142 mEq/L (ref 135–145)

## 2012-01-09 LAB — CBC
HCT: 34.6 % — ABNORMAL LOW (ref 36.0–46.0)
Hemoglobin: 11.3 g/dL — ABNORMAL LOW (ref 12.0–15.0)
MCH: 28.5 pg (ref 26.0–34.0)
MCHC: 32.7 g/dL (ref 30.0–36.0)
MCV: 87.2 fL (ref 78.0–100.0)
Platelets: 211 10*3/uL (ref 150–400)
RBC: 3.97 MIL/uL (ref 3.87–5.11)
RDW: 13.2 % (ref 11.5–15.5)
WBC: 6.3 10*3/uL (ref 4.0–10.5)

## 2012-01-09 LAB — TROPONIN I
Troponin I: <0.3 ng/mL (ref <0.30)
Troponin I: <0.3 ng/mL (ref <0.30)

## 2012-01-09 LAB — TSH: TSH: 0.218 u[IU]/mL — ABNORMAL LOW (ref 0.350–4.500)

## 2012-01-09 LAB — GLUCOSE, CAPILLARY: Glucose-Capillary: 126 mg/dL — ABNORMAL HIGH (ref 70–99)

## 2012-01-09 MED ORDER — MORPHINE SULFATE 2 MG/ML IJ SOLN
2.0000 mg | INTRAMUSCULAR | Status: DC | PRN
  Administered 2012-01-09 (×2): 2 mg via INTRAVENOUS
  Filled 2012-01-09 (×2): qty 1

## 2012-01-09 MED ORDER — FUROSEMIDE 10 MG/ML IJ SOLN
40.0000 mg | Freq: Once | INTRAMUSCULAR | Status: AC
  Administered 2012-01-09: 40 mg via INTRAVENOUS
  Filled 2012-01-09: qty 4

## 2012-01-09 MED ORDER — CARVEDILOL PHOSPHATE ER 10 MG PO CP24
10.0000 mg | ORAL_CAPSULE | Freq: Every day | ORAL | Status: DC
  Administered 2012-01-09 – 2012-01-11 (×3): 10 mg via ORAL
  Filled 2012-01-09 (×3): qty 1

## 2012-01-09 MED ORDER — POTASSIUM CHLORIDE CRYS ER 20 MEQ PO TBCR
40.0000 meq | EXTENDED_RELEASE_TABLET | ORAL | Status: AC
  Administered 2012-01-09 (×2): 40 meq via ORAL
  Filled 2012-01-09 (×2): qty 2

## 2012-01-09 NOTE — Progress Notes (Signed)
Pt. Transferred out to 2025-report given to charge nurse Dinah,  Pt. Vitals stable. Transferred via wheelchair accompanied by nurse.

## 2012-01-09 NOTE — Progress Notes (Signed)
  Echocardiogram 2D Echocardiogram has been performed.  [name] 01/09/2012, 2:41 PM

## 2012-01-09 NOTE — Progress Notes (Signed)
Pt. Ambulated around the hallway with a walker,  with cont. pulse ox, sats. maintained to 95% room air, pt. tolerated well.

## 2012-01-09 NOTE — Progress Notes (Signed)
PROGRESS NOTE  Subjective:   Pt continues to have pain - now she states it's "in her back" .  Cardiac enzymes are negative  Objective:    Vital Signs:   Temp:  [97.8 F (36.6 C)-98.9 F (37.2 C)] 98.1 F (36.7 C) (09/17 0300) Pulse Rate:  [60-80] 80  (09/17 0700) Resp:  [13-20] 19  (09/17 0700) BP: (80-129)/(42-82) 129/82 mmHg (09/17 0700) SpO2:  [94 %-100 %] 98 % (09/17 0700) Weight:  [233 lb 4 oz (105.8 kg)] 233 lb 4 oz (105.8 kg) (09/16 2047)  Last BM Date: 01/08/12   24-hour weight change: Weight change:   Weight trends: Filed Weights   01/08/12 2047  Weight: 233 lb 4 oz (105.8 kg)    Intake/Output:  09/16 0701 - 09/17 0700 In: 858.8 [P.O.:120; I.V.:738.8] Out: 200 [Urine:200]     Physical Exam: BP 129/82  Pulse 80  Temp 98.1 F (36.7 C) (Oral)  Resp 19  Ht 5' (1.524 m)  Wt 233 lb 4 oz (105.8 kg)  BMI 45.55 kg/m2  SpO2 98%  General: Vital signs reviewed and noted. Sleepy this am. Morbidly obese  Head: Normocephalic, atraumatic.  Eyes: conjunctivae/corneas clear.  EOM's intact.   Throat: normal  Neck: Supple. Normal carotids. No JVD  Lungs:  Clear to auscultation  Heart: Regular rate,  With normal  S1 S2. No murmurs, gallops or rubs  Abdomen:  Soft, non-tender, non-distended with normoactive bowel sounds. No hepatomegaly. No rebound/guarding. No abdominal masses.  Extremities: Distal pedal pulses are 2+ .  No edema.    Neurologic: A&O X3, CN II - XII are grossly intact. Motor strength is 5/5 in the all 4 extremities.  Psych: Sleepy this am.    Labs: BMET:  Basename 01/09/12 0250 01/08/12 2128 01/08/12 1435  NA 142 -- 140  K 3.2* -- 3.4*  CL 105 -- 103  CO2 30 -- 27  GLUCOSE 140* -- 121*  BUN 14 -- 14  CREATININE 0.88 0.84 --  CALCIUM 9.0 -- 9.6  MG -- -- --  PHOS -- -- --    Liver function tests: No results found for this basename: AST:2,ALT:2,ALKPHOS:2,BILITOT:2,PROT:2,ALBUMIN:2 in the last 72 hours No results found for this  basename: LIPASE:2,AMYLASE:2 in the last 72 hours  CBC:  Basename 01/09/12 0250 01/08/12 2128  WBC 6.3 6.8  NEUTROABS -- --  HGB 11.3* 11.8*  HCT 34.6* 36.2  MCV 87.2 86.8  PLT 211 228    Cardiac Enzymes:  Basename 01/09/12 0250 01/08/12 2128 01/08/12 1435  CKTOTAL -- 120 --  CKMB -- 2.2 --  TROPONINI <0.30 <0.30 <0.30    Coagulation Studies:  Basename 01/08/12 1521  LABPROT 12.9  INR 0.95    Other:    Tele:  NSR  Medications:    Infusions:    . sodium chloride 75 mL/hr at 01/08/12 2109    Scheduled Medications:    . aspirin EC  81 mg Oral Daily  . aspirin  325 mg Oral STAT  . atorvastatin  20 mg Oral QHS  . enoxaparin (LOVENOX) injection  40 mg Subcutaneous Q24H  . influenza  inactive virus vaccine  0.5 mL Intramuscular Tomorrow-1000  . isosorbide dinitrate  20 mg Oral BID  .  morphine injection  2 mg Intravenous STAT  . pneumococcal 23 valent vaccine  0.5 mL Intramuscular Tomorrow-1000  . sertraline  100 mg Oral Daily  . sodium chloride  1,000 mL Intravenous Once  . DISCONTD: sodium chloride   Intravenous STAT  Assessment/ Plan:    Chest tightness:  Cardiac enzymes are negative.  Has had a normal cardiac cath in 2008.  She had a normal myoview in December, 2013.  No evidence that this is cardiac. Will sign off.  Further disposition per Internal Medicine.   Vesta Mixer, Montez Hageman., MD, [facility] 01/09/2012, 7:37 AM Office [phone] Pager [phone]

## 2012-01-09 NOTE — ED Provider Notes (Signed)
I reviewed and interpreted the EKG during the patient's evaluation in the ED and agree with the resident's interpretation. I saw and evaluated the patient, reviewed the resident's note and I agree with the findings and plan.  Pt with complaints of chest pain and dyspnea.  CT scan without signs of PE however pt has been hypotensive despite treatment.  Initial troponin normal.  ?subclinical infection, ?cardiac etiology.  Will admit for further evaluation and monitoring.  Celene Kras, MD 01/09/12 [phone]

## 2012-01-09 NOTE — Progress Notes (Signed)
[facility] PROGRESS NOTE  [name] AVW:[id number] DOB: 25-Dec-1960 DOA: 01/08/2012 PCP: Norberto Sorenson, MD  Assessment/Plan: Principal Problem:  *Chest pain/ Dyspnea on exertion Suspect diastolic dysfunction is resulting in excessive cardiac strain. Will add Lasix and a B blocker to her medications and follow for improvement in chest pain. I am hesitant to say the mediastinal mass is resulting in chest pain with exertion. It would be beneficial to check pulse ox on exertion in AM.   Active Problems:  Diastolic CHF- acute on chronic ECHO reveals grade 2 diastolic dysfunction and CT chest reveals pulmonary edema- will give 2 doses of Lasix and start a B blocker.    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    OBESITY NOS Has been having trouble with losing weight.    PULMONARY EMBOLISM, HX OF Treated for this - no recurrences  Disposition Plan: transfer to telemetry  Brief narrative: 51 y/o female who has been admitted multiple times in the past for recurrent chest pains, came to hospital after she developed chest pain while walking to church around 11 am.Patient says she felt dizzy and weak, with chest pain and shortness of breath. Patient admits to having nausea but denies vomiting. CT angio was done in the ER which is negative for pulmonary embolism. Patient is found to have anterior mediastinal mass, which has grown in size from 2008.  Consultants: Cardiology  Procedures:  none  Antibiotics:  none  HPI/Subjective: Patient has given me an extensive history- She admits to dyspnea on exertion which has been progressive over the past year. When she exerts herself, she develops chest tightness along with the dyspnea. Both symptoms resolve with resting. She also admits to orthopnea over the past few weeks. She often had swelling of his ankles.  Looking at  her records, she has had pulmonary evaluation for dyspnea on exertion and PFTs. She has been advised to lose weight and also has been placed on a CPAP with which she states she is compliant.    Objective: Filed Vitals:   01/09/12 1100 01/09/12 1200 01/09/12 1600 01/09/12 1639  BP: 152/94 152/97 129/75   Pulse: 70 87  73  Temp:  98 F (36.7 C)  97.3 F (36.3 C)  TempSrc:  Oral  Oral  Resp: 14 19 18    Height:      Weight:      SpO2: 100% 100%  97%    Intake/Output Summary (Last 24 hours) at 01/09/12 1835 Last data filed at 01/09/12 1800  Gross per 24 hour  Intake 1697.75 ml  Output   2000 ml  Net -302.25 ml    Exam:   General:  Alert, no acute distress  Cardiovascular: RRR, No murmurs  Respiratory: CTA b/l  Abdomen: soft, NT, ND BS+  Ext: mild pitting edema  Data Reviewed: Basic Metabolic Panel:  Lab 01/09/12 8295 01/08/12 2128 01/08/12 1435  NA 142 -- 140  K 3.2* -- 3.4*  CL 105 -- 103  CO2 30 -- 27  GLUCOSE 140* -- 121*  BUN 14 -- 14  CREATININE 0.88 0.84 0.89  CALCIUM 9.0 -- 9.6  MG -- -- --  PHOS -- -- --   Liver Function Tests: No results found for this basename: AST:5,ALT:5,ALKPHOS:5,BILITOT:5,PROT:5,ALBUMIN:5 in the last 168 hours No results found for this basename: LIPASE:5,AMYLASE:5 in the last 168 hours No results  found for this basename: AMMONIA:5 in the last 168 hours CBC:  Lab 01/09/12 0250 01/08/12 2128 01/08/12 1435  WBC 6.3 6.8 7.0  NEUTROABS -- -- --  HGB 11.3* 11.8* 11.7*  HCT 34.6* 36.2 35.3*  MCV 87.2 86.8 86.5  PLT 211 228 229   Cardiac Enzymes:  Lab 01/09/12 0815 01/09/12 0250 01/08/12 2128 01/08/12 1435  CKTOTAL -- -- 120 --  CKMB -- -- 2.2 --  CKMBINDEX -- -- -- --  TROPONINI <0.30 <0.30 <0.30 <0.30   BNP (last 3 results)  Basename 01/08/12 1435 03/15/11 1127  PROBNP 34.3 40.6   CBG:  Lab 01/08/12 1326  GLUCAP 166*    Recent Results (from the past 240 hour(s))  MRSA PCR SCREENING     Status: Normal    Collection Time   01/08/12  8:40 PM      Component Value Range Status Comment   MRSA by PCR NEGATIVE  NEGATIVE Final      Studies: Ct Angio Chest Pe W/cm &/or Wo Cm  01/08/2012  *RADIOLOGY REPORT*  Clinical Data: Chest tightness.  Shortness of breath.  CT ANGIOGRAPHY CHEST  Technique:  Multidetector CT imaging of the chest using the standard protocol during bolus administration of intravenous contrast. Multiplanar reconstructed images including MIPs were obtained and reviewed to evaluate the vascular anatomy.  Contrast: OMNIPAQUE IOHEXOL 350 MG/ML SOLN  Comparison: One view chest 01/08/2012.  C t a chest 06/06/2011.  Findings: Pulmonary arterial opacification is satisfactory.  The study is mildly degraded by patient motion and body habitus.  No focal filling defects are evident to suggest pulmonary embolus.  The heart size is normal.  No significant pleural or pericardial effusion is present.  A prominent thyroid goiter is stable.  An anterior mediastinal mass is slowly growing over time when compared with studies from 2008. Mild diffuse ground-glass attenuation is compatible with edema or atelectasis.  There is dependent atelectasis bilaterally as well. No focal nodule mass or airspace consolidation is present otherwise.  The bone windows demonstrate mild degenerative changes.  No focal lytic or blastic lesions are evident.  IMPRESSION:  1.  No evidence of pulmonary embolus. 2.  Low lung volumes with edema or atelectasis. 3.  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   Original Report Authenticated By: Jamesetta Orleans. MATTERN, M.D.    Dg Chest Port 1 View  01/08/2012  *RADIOLOGY REPORT*  Clinical Data: Shortness of breath, hypertension, diabetes, former smoker, asthma, COPD  PORTABLE CHEST - 1 VIEW  Comparison: Portable exam 1435 hours compared to 12/02/2011  Findings: Enlargement of cardiac silhouette. Mediastinal contours and pulmonary  vascularity normal. Right basilar atelectasis. Remaining lungs grossly clear. No pleural effusion or pneumothorax. Numerous cardiac monitoring leads project over chest.  IMPRESSION: Enlargement of cardiac silhouette. Right basilar atelectasis.   Original Report Authenticated By: Lollie Marrow, M.D.     Scheduled Meds:    . aspirin EC  81 mg Oral Daily  . atorvastatin  20 mg Oral QHS  . carvedilol  10 mg Oral Daily  . enoxaparin (LOVENOX) injection  40 mg Subcutaneous Q24H  . furosemide  40 mg Intravenous Once  . influenza  inactive virus vaccine  0.5 mL Intramuscular Tomorrow-1000  . isosorbide dinitrate  20 mg Oral BID  . pneumococcal 23 valent vaccine  0.5 mL Intramuscular Tomorrow-1000  . potassium chloride  40 mEq Oral Q4H  . sertraline  100 mg Oral Daily  . DISCONTD: sodium chloride  Intravenous STAT   Continuous Infusions:    . DISCONTD: sodium chloride 75 mL/hr at 01/08/12 2109    ________________________________________________________________________  Time spent:40 minutes    [facility]  [facility] Pager [phone] If 8PM-8AM, please contact night-coverage at [email or web address], password [facility] 01/09/2012, 6:35 PM  LOS: 1 day

## 2012-01-10 ENCOUNTER — Inpatient Hospital Stay (HOSPITAL_COMMUNITY): Payer: Medicare Other

## 2012-01-10 DIAGNOSIS — M129 Arthropathy, unspecified: Secondary | ICD-10-CM

## 2012-01-10 DIAGNOSIS — F329 Major depressive disorder, single episode, unspecified: Secondary | ICD-10-CM

## 2012-01-10 DIAGNOSIS — M5137 Other intervertebral disc degeneration, lumbosacral region: Secondary | ICD-10-CM

## 2012-01-10 DIAGNOSIS — Z86718 Personal history of other venous thrombosis and embolism: Secondary | ICD-10-CM

## 2012-01-10 DIAGNOSIS — E785 Hyperlipidemia, unspecified: Secondary | ICD-10-CM

## 2012-01-10 DIAGNOSIS — K589 Irritable bowel syndrome without diarrhea: Secondary | ICD-10-CM

## 2012-01-10 DIAGNOSIS — F411 Generalized anxiety disorder: Secondary | ICD-10-CM

## 2012-01-10 DIAGNOSIS — E669 Obesity, unspecified: Secondary | ICD-10-CM

## 2012-01-10 LAB — GLUCOSE, CAPILLARY
Glucose-Capillary: 141 mg/dL — ABNORMAL HIGH (ref 70–99)
Glucose-Capillary: 152 mg/dL — ABNORMAL HIGH (ref 70–99)

## 2012-01-10 LAB — BASIC METABOLIC PANEL
BUN: 10 mg/dL (ref 6–23)
CO2: 30 mEq/L (ref 19–32)
Calcium: 9.8 mg/dL (ref 8.4–10.5)
Chloride: 100 mEq/L (ref 96–112)
Creatinine, Ser: 0.75 mg/dL (ref 0.50–1.10)
GFR calc Af Amer: >90 mL/min (ref >90)
GFR calc non Af Amer: >90 mL/min (ref >90)
Glucose, Bld: 222 mg/dL — ABNORMAL HIGH (ref 70–99)
Potassium: 3.6 mEq/L (ref 3.5–5.1)
Sodium: 142 mEq/L (ref 135–145)

## 2012-01-10 LAB — HEMOGLOBIN A1C
Hgb A1c MFr Bld: 6.8 % — ABNORMAL HIGH (ref <5.7)
Mean Plasma Glucose: 148 mg/dL — ABNORMAL HIGH (ref <117)

## 2012-01-10 MED ORDER — INSULIN ASPART 100 UNIT/ML ~~LOC~~ SOLN
0.0000 [IU] | Freq: Three times a day (TID) | SUBCUTANEOUS | Status: DC
  Administered 2012-01-10 – 2012-01-11 (×2): 1 [IU] via SUBCUTANEOUS
  Administered 2012-01-11: 2 [IU] via SUBCUTANEOUS

## 2012-01-10 MED ORDER — POTASSIUM CHLORIDE CRYS ER 20 MEQ PO TBCR
20.0000 meq | EXTENDED_RELEASE_TABLET | Freq: Every day | ORAL | Status: DC
  Administered 2012-01-10 – 2012-01-11 (×2): 20 meq via ORAL
  Filled 2012-01-10 (×2): qty 1

## 2012-01-10 MED ORDER — FUROSEMIDE 40 MG PO TABS
40.0000 mg | ORAL_TABLET | Freq: Every day | ORAL | Status: DC
  Administered 2012-01-10 – 2012-01-11 (×2): 40 mg via ORAL
  Filled 2012-01-10 (×2): qty 1

## 2012-01-10 MED ORDER — INSULIN ASPART 100 UNIT/ML ~~LOC~~ SOLN: 0.0000 [IU] | Freq: Every day | SUBCUTANEOUS | Status: DC

## 2012-01-10 NOTE — Progress Notes (Signed)
Pt c/o numbness in bil thighs that has been ongoing since yesterday; will page MD to make aware; also pt is DM and no orders for CBG; will also make MD aware; will await callback.

## 2012-01-10 NOTE — Progress Notes (Signed)
New orders entered for diet change, CBG's; DM meds, and lab work; will cont. To monitor.

## 2012-01-10 NOTE — Progress Notes (Signed)
Pt back to bed from BR and complaining of SOB; O2 2L [place] applied for comfort per pt request; O2 sats 97% RA.

## 2012-01-10 NOTE — Progress Notes (Signed)
Pt encouraged to get up OOB and sit in chair; pt not wanting to get OOB at this time; will cont. To monitor.

## 2012-01-10 NOTE — Care Management Note (Unsigned)
    Page 1 of 2   01/11/2012     11:12:52 AM   CARE MANAGEMENT NOTE 01/11/2012  Patient:  CAMREY, CAIRNES   Account Number:  [id number]  Date Initiated:  01/09/2012  Documentation initiated by:  Junius Creamer  Subjective/Objective Assessment:   adm w hypotension     Action/Plan:   lives w fam, pcp dr Norberto Sorenson   Anticipated DC Date:  01/11/2012   Anticipated DC Plan:  HOME/SELF CARE      DC Planning Services  CM consult  PCP issues      [facility] Choice  HOME HEALTH   Choice offered to / List presented to:  C-1 Patient   DME arranged  Levan Hurst      DME agency  [facility]     [facility] arranged  HH-1 RN  HH-10 DISEASE MANAGEMENT      HH agency  [facility]   Status of service:  In process, will continue to follow Medicare Important Message given?   (If response is "NO", the following Medicare IM given date fields will be blank) Date Medicare IM given:   Date Additional Medicare IM given:    Discharge Disposition:    Per UR Regulation:  Reviewed for med. necessity/level of care/duration of stay  If discussed at Long Length of Stay Meetings, dates discussed:    Comments:  01/11/12  1057  Christopherjohn Schiele SIMMONS RN, BSN [phone] REFERRAL PLACED TO [facility] WITH [facility] FOR HHRN/ HHPT PER CHOICE; SOC DATE: WITHIN 24-48 HRS POST D/C;  REFERRAL PLACED TO [facility] WITH [facility] FOR RW;  NCM WILL FOLLOW.  01/10/12  1011  Dulcemaria Bula SIMMONS RN, BSN [phone] PROVIDED PCP RESOURCE LIST TO PT WITH INSTRUCTIONS; NCM WILL FOLLOW.    9/17 10a debbie dowell rn,bsn [phone]

## 2012-01-10 NOTE — Progress Notes (Signed)
Patient ID: [name]  female  ZOX:[id number]    DOB: 11-29-1960    DOA: 01/08/2012  PCP: Norberto Sorenson, MD  Subjective: Complaining of right shoulder pain, pressure-like left-sided chest pain. States shortness of breath is improving, no nausea, vomiting, abdominal pain.  Objective: Weight change: -0.566 kg (-1 lb 4 oz)  Intake/Output Summary (Last 24 hours) at 01/10/12 1223 Last data filed at 01/10/12 1046  Gross per 24 hour  Intake    764 ml  Output   2900 ml  Net  -2136 ml   Blood pressure 130/59, pulse 70, temperature 98.3 F (36.8 C), temperature source Oral, resp. rate 18, height 5' (1.524 m), weight 105.235 kg (232 lb), SpO2 97.00%.  Physical Exam: General: Alert and awake, oriented x3, not in any acute distress. HEENT: anicteric sclera, pupils reactive to light and accommodation, EOMI CVS: S1-S2 clear, no murmur rubs or gallops Chest: clear to auscultation bilaterally, no wheezing, rales or rhonchi Abdomen: soft nontender, nondistended, normal bowel sounds, no organomegaly Extremities: no cyanosis, clubbing or mild edema noted bilaterally Neuro: Cranial nerves II-XII intact, no focal neurological deficits  Lab Results: Basic Metabolic Panel:  Lab 01/10/12 8119 01/09/12 0250  NA 142 142  K 3.6 3.2*  CL 100 105  CO2 30 30  GLUCOSE 222* 140*  BUN 10 14  CREATININE 0.75 0.88  CALCIUM 9.8 9.0  MG -- --  PHOS -- --   CBC:  Lab 01/09/12 0250 01/08/12 2128  WBC 6.3 6.8  NEUTROABS -- --  HGB 11.3* 11.8*  HCT 34.6* 36.2  MCV 87.2 86.8  PLT 211 228   Cardiac Enzymes:  Lab 01/09/12 0815 01/09/12 0250 01/08/12 2128  CKTOTAL -- -- 120  CKMB -- -- 2.2  CKMBINDEX -- -- --  TROPONINI <0.30 <0.30 <0.30   BNP: No components found with this basename: POCBNP:2 CBG:  Lab 01/09/12 2202 01/08/12 1326  GLUCAP 126* 166*     Micro Results: Recent Results (from the past 240 hour(s))  MRSA PCR SCREENING     Status: Normal   Collection Time   01/08/12  8:40 PM      Component Value Range Status Comment   MRSA by PCR NEGATIVE  NEGATIVE Final     Studies/Results: Ct Angio Chest Pe W/cm &/or Wo Cm  01/08/2012  *RADIOLOGY REPORT*  Clinical Data: Chest tightness.  Shortness of breath.  CT ANGIOGRAPHY CHEST  Technique:  Multidetector CT imaging of the chest using the standard protocol during bolus administration of intravenous contrast. Multiplanar reconstructed images including MIPs were obtained and reviewed to evaluate the vascular anatomy.  Contrast: OMNIPAQUE IOHEXOL 350 MG/ML SOLN  Comparison: One view chest 01/08/2012.  C t a chest 06/06/2011.  Findings: Pulmonary arterial opacification is satisfactory.  The study is mildly degraded by patient motion and body habitus.  No focal filling defects are evident to suggest pulmonary embolus.  The heart size is normal.  No significant pleural or pericardial effusion is present.  A prominent thyroid goiter is stable.  An anterior mediastinal mass is slowly growing over time when compared with studies from 2008. Mild diffuse ground-glass attenuation is compatible with edema or atelectasis.  There is dependent atelectasis bilaterally as well. No focal nodule mass or airspace consolidation is present otherwise.  The bone windows demonstrate mild degenerative changes.  No focal lytic or blastic lesions are evident.  IMPRESSION:  1.  No evidence of pulmonary embolus. 2.  Low lung volumes with edema or atelectasis. 3.  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   Original Report Authenticated By: Jamesetta Orleans. MATTERN, M.D.    Dg Chest Port 1 View  01/08/2012  *RADIOLOGY REPORT*  Clinical Data: Shortness of breath, hypertension, diabetes, former smoker, asthma, COPD  PORTABLE CHEST - 1 VIEW  Comparison: Portable exam 1435 hours compared to 12/02/2011  Findings: Enlargement of cardiac silhouette. Mediastinal contours and pulmonary vascularity normal. Right basilar  atelectasis. Remaining lungs grossly clear. No pleural effusion or pneumothorax. Numerous cardiac monitoring leads project over chest.  IMPRESSION: Enlargement of cardiac silhouette. Right basilar atelectasis.   Original Report Authenticated By: Lollie Marrow, M.D.     Medications: Scheduled Meds:   . aspirin EC  81 mg Oral Daily  . atorvastatin  20 mg Oral QHS  . carvedilol  10 mg Oral Daily  . enoxaparin (LOVENOX) injection  40 mg Subcutaneous Q24H  . furosemide  40 mg Intravenous Once  . furosemide  40 mg Intravenous Once  . furosemide  40 mg Oral Daily  . isosorbide dinitrate  20 mg Oral BID  . potassium chloride  20 mEq Oral Daily  . potassium chloride  40 mEq Oral Q4H  . sertraline  100 mg Oral Daily   Continuous Infusions:    Assessment/Plan:   Principal problem *Chest pain/ Dyspnea on exertion  - Continue Lasix and a B blocker to her medications and follow for improvement in chest pain. 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 Cardiology signed off.  Right shoulder pain: On movement - Ordered right shoulder x-ray, if completely negative, patient may need MRI of the right shoulder to rule out rotator cuff tear  Diastolic CHF- acute on chronic  ECHO reveals grade 2 diastolic dysfunction and CT chest reveals pulmonary edema-  continue Lasix and B blocker.   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 Patient is in the process of having a new PCP and will followup with cardiothoracic surgery after that.  OBESITY NOS  Has been having trouble with losing weight.   PULMONARY EMBOLISM, HX OF  Treated for this - no recurrences   DVT Prophylaxis: lovenox  Code  Status:  Disposition:Hopefully tomorrow   LOS: 2 days   RAI,RIPUDEEP M.D. [facility] 01/10/2012, 12:23 PM Pager: [phone]  If 7PM-7AM, please contact night-coverage [email or web address] Password [email or web address]

## 2012-01-10 NOTE — Progress Notes (Signed)
PT Cancellation Note  Treatment cancelled today due to patient receiving procedure or test.  Pt leaving for x-ray.  PT to check back later today or tomorrow as time allows.    Rollene Rotunda [place], PT, DPT [phone]   01/10/2012, 11:02 AM

## 2012-01-11 DIAGNOSIS — E7800 Pure hypercholesterolemia, unspecified: Secondary | ICD-10-CM

## 2012-01-11 LAB — BASIC METABOLIC PANEL
BUN: 10 mg/dL (ref 6–23)
CO2: 32 mEq/L (ref 19–32)
Calcium: 9.4 mg/dL (ref 8.4–10.5)
Chloride: 101 mEq/L (ref 96–112)
Creatinine, Ser: 0.72 mg/dL (ref 0.50–1.10)
GFR calc Af Amer: >90 mL/min (ref >90)
GFR calc non Af Amer: >90 mL/min (ref >90)
Glucose, Bld: 129 mg/dL — ABNORMAL HIGH (ref 70–99)
Potassium: 3.7 mEq/L (ref 3.5–5.1)
Sodium: 139 mEq/L (ref 135–145)

## 2012-01-11 LAB — GLUCOSE, CAPILLARY
Glucose-Capillary: 127 mg/dL — ABNORMAL HIGH (ref 70–99)
Glucose-Capillary: 172 mg/dL — ABNORMAL HIGH (ref 70–99)

## 2012-01-11 MED ORDER — LISINOPRIL 10 MG PO TABS: 20.0000 mg | ORAL_TABLET | Freq: Every day | ORAL | Status: DC

## 2012-01-11 MED ORDER — LIDOCAINE 5 % EX PTCH: 1.0000 | MEDICATED_PATCH | CUTANEOUS | Status: DC

## 2012-01-11 MED ORDER — FUROSEMIDE 40 MG PO TABS: 40.0000 mg | ORAL_TABLET | Freq: Every day | ORAL | Status: DC

## 2012-01-11 MED ORDER — LIDOCAINE 5 % EX PTCH
1.0000 | MEDICATED_PATCH | CUTANEOUS | Status: DC
  Filled 2012-01-11: qty 1

## 2012-01-11 MED ORDER — HYDROCODONE-ACETAMINOPHEN 5-325 MG PO TABS: 1.0000 | ORAL_TABLET | Freq: Four times a day (QID) | ORAL | Status: DC | PRN

## 2012-01-11 MED ORDER — CARVEDILOL PHOSPHATE ER 10 MG PO CP24: 10.0000 mg | ORAL_CAPSULE | Freq: Every day | ORAL | Status: DC

## 2012-01-11 NOTE — Discharge Summary (Signed)
Physician Discharge Summary  Patient ID: [name] MRN: [id number] DOB/AGE: 1960/11/22 51 y.o.  Admit date: 01/08/2012 Discharge date: 01/11/2012  Primary Care Physician:  Norberto Sorenson, MD  Discharge Diagnoses:    .Hypotension/near syncope  . 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 For shortness of breath      aspirin 81 MG tablet   Take 81 mg by mouth daily.      atorvastatin 20 MG tablet   Commonly known as: LIPITOR   Take 20 mg by mouth at bedtime.      carvedilol 10 MG 24 hr capsule   Commonly known as: COREG CR   Take 1 capsule (10 mg total) by mouth daily.      cyclobenzaprine 10 MG tablet   Commonly known as: FLEXERIL   Take 10 mg by mouth daily.      diazepam 5 MG tablet   Commonly known as: VALIUM   Take 5 mg by mouth at bedtime.      furosemide 40 MG tablet   Commonly known as: LASIX   Take 1 tablet (40 mg total) by mouth daily.      HYDROcodone-acetaminophen 5-325 MG per tablet   Commonly known as: NORCO/VICODIN   Take 1 tablet by mouth every 6 (six) hours as needed. For pain      ibuprofen 200 MG tablet   Commonly known as: ADVIL,MOTRIN   Take 200 mg by mouth every 6 (six) hours as needed. For pain      isosorbide dinitrate 20 MG tablet   Commonly known as: ISORDIL   Take 20 mg by mouth 2  (two) times daily.      lidocaine 5 %   Commonly known as: LIDODERM   Place 1 patch onto the skin daily. Remove & Discard patch within 12 hours or as directed by MD      lisinopril 10 MG tablet   Commonly known as: PRINIVIL,ZESTRIL   Take 2 tablets (20 mg total) by mouth daily.      loratadine 10 MG tablet   Commonly known as: CLARITIN   Take 10 mg by mouth daily.      metFORMIN 1000 MG tablet   Commonly known as: GLUCOPHAGE   Take 1,000 mg by mouth 2 (two) times daily with a meal.      multivitamin with minerals tablet   Take 1 tablet by mouth daily.      nitroGLYCERIN 0.4 MG SL tablet   Commonly known as: NITROSTAT   Place 0.4 mg under the tongue every 5 (five) minutes as needed. For chest pain.      omeprazole 20 MG capsule   Commonly known as: PRILOSEC   Take 20 mg by mouth daily.  potassium chloride 10 MEQ tablet   Commonly known as: K-DUR,KLOR-CON   Take 10 mEq by mouth 2 (two) times daily.      PROBIOTIC DAILY PO   Take 1 capsule by mouth daily.      sertraline 100 MG tablet   Commonly known as: ZOLOFT   Take 100 mg by mouth daily.         Brief H and P: For complete details please refer to admission H and P, but in brief 51 y/o female who has been admitted multiple times in the past for recurrent chest pains, came to hospital after she developed chest pain while walking to church around 11 am.Patient says she felt dizzy and weak, with chest pain and shortness of breath. Patient admits to having nausea but denies vomiting. CT angio was done in the ER which is negative for pulmonary embolism. Patient is found to have anterior mediastinal mass, which has grown in size from 2008.   Hospital Course:    Acute on chronic anginal pain syndrome: The patient was admitted to the step down unit due to her recurrent midsternal chest pain, ECG was nonspecific and and chronic enzymes were essentially normal. Cardiology was consulted, review patient had a normal cardiac  cath in 2008 and a normal Myoview stress test in December 2012. 2-D echocardiogram was obtained which showed EF of 55-60% and normal wall motion with no regional wall motion abnormalities. Per cardiology no further cardiac evaluation is warranted and they do not feel that this was a cardiac chest pain. It was suspected that possibly due to her diastolic dysfunction is resulting in excessive cardiac strain, patient was placed on Lasix and beta blocker.  OBESITY NOS: Patient was counseled strongly to lose weight and diet control   Hypotension: Patient had hypotension with near syncope at the time of admission with systolic BP in 80's. Likely due to multiple antihypertensives at home. Clonidine, lisinopril HCTZ combo were discontinued. Patient did well on Coreg, lisinopril and lasix.   Acute on chronic Diastolic CHF: Symptoms significantly improved with Lasix and Coreg. Patient was strongly counseled for diet control, 2 g sodium diet and diabetes control.   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 Patient is in the process of having a new PCP and will followup with cardiothoracic surgery after that.  Right shoulder pain: X-ray was obtained which was consistent with degenerative arthritis.    Day of Discharge BP 147/84  Pulse 67  Temp 98.4 F (36.9 C) (Oral)  Resp 17  Ht 5' (1.524 m)  Wt 105.9 kg (233 lb 7.5 oz)  BMI 45.60 kg/m2  SpO2 99%  Physical Exam: General: Alert and awake oriented x3 not in any acute distress. HEENT: anicteric sclera, pupils reactive to light and accommodation CVS: S1-S2 clear no murmur rubs or gallops Chest: clear to auscultation bilaterally, no wheezing rales or rhonchi Abdomen: soft nontender, nondistended, normal bowel sounds, no organomegaly Extremities: no cyanosis, clubbing or edema noted bilaterally Neuro: Cranial nerves II-XII  intact, no focal neurological deficits   The results of significant diagnostics from this hospitalization (including imaging, microbiology, ancillary and laboratory) are listed below for reference.    LAB RESULTS: Basic Metabolic Panel:  Lab 01/11/12 3086 01/10/12 1105  NA 139 142  K 3.7 3.6  CL 101 100  CO2 32 30  GLUCOSE 129*  222*  BUN 10 10  CREATININE 0.72 0.75  CALCIUM 9.4 9.8  MG -- --  PHOS -- --   CBC:  Lab 01/09/12 0250 01/08/12 2128  WBC 6.3 6.8  NEUTROABS -- --  HGB 11.3* 11.8*  HCT 34.6* 36.2  MCV 87.2 --  PLT 211 228   Cardiac Enzymes:  Lab 01/09/12 0815 01/09/12 0250 01/08/12 2128  CKTOTAL -- -- 120  CKMB -- -- 2.2  CKMBINDEX -- -- --  TROPONINI <0.30 <0.30 --   CBG:  Lab 01/11/12 0636 01/10/12 2027  GLUCAP 127* 152*    Significant Diagnostic Studies:  Ct Angio Chest Pe W/cm &/or Wo Cm  01/08/2012   IMPRESSION:  1.  No evidence of pulmonary embolus. 2.  Low lung volumes with edema or atelectasis. 3.  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   Original Report Authenticated By: Jamesetta Orleans. MATTERN, M.D.    Dg Chest Port 1 View  01/08/2012  *RADIOLOGY REPORT*  Clinical Data: Shortness of breath, hypertension, diabetes, former smoker, asthma, COPD  PORTABLE CHEST - 1 VIEW  Comparison: Portable exam 1435 hours compared to 12/02/2011  Findings: Enlargement of cardiac silhouette. Mediastinal contours and pulmonary vascularity normal. Right basilar atelectasis. Remaining lungs grossly clear. No pleural effusion or pneumothorax. Numerous cardiac monitoring leads project over chest.  IMPRESSION: Enlargement of cardiac silhouette. Right basilar atelectasis.   Original Report Authenticated By: Lollie Marrow, M.D.      Disposition and Follow-up:     Discharge Orders    Future Orders Please Complete By Expires   Diet - low sodium heart healthy      Diet Carb Modified      Increase activity slowly       (HEART FAILURE PATIENTS) Call MD:  Anytime you have any of the following symptoms: 1) 3 pound weight gain in 24 hours or 5 pounds in 1 week 2) shortness of breath, with or without a dry hacking cough 3) swelling in the hands, feet or stomach 4) if you have to sleep on extra pillows at night in order to breathe.          DISPOSITION: Home  DIET: Low-sodium diet, carbo  ACTIVITY: As tolerated   DISCHARGE FOLLOW-UP Follow-up Information    Follow up with Norberto Sorenson, MD. Schedule an appointment as soon as possible for a visit in 10 days. (for hospital follow-up)    Contact information:   [address] [place] [address] [phone]          Time spent on Discharge:  Signed:   RAI,RIPUDEEP M.D. [facility] 01/11/2012, 10:11 AM Pager: [phone]     RAI,RIPUDEEP M.D. [facility] 01/11/2012, 10:11 AM  Pager: [phone]

## 2012-01-11 NOTE — Evaluation (Signed)
Physical Therapy Evaluation Patient Details Name: [name] MRN: [id number] DOB: [birth year] Today's Date: 01/11/2012 Time: 4098-1191 PT Time Calculation (min): 28 min  PT Assessment / Plan / Recommendation Clinical Impression  Pt is a 51 y/o female admitted with chest pain and DOE along with the below PT problem list.  Pt also with right shoulder pain with x-ray negative.  Pt would benefit from acute PT to maximize independence and facilitate d/c home with HHPT.    PT Assessment  Patient needs continued PT services    Follow Up Recommendations  Home health PT    Barriers to Discharge None      Equipment Recommendations  Rolling walker with 5" wheels    Recommendations for Other Services     Frequency Min 3X/week    Precautions / Restrictions Precautions Precautions: None Restrictions Weight Bearing Restrictions: No   Pertinent Vitals/Pain None      Mobility  Bed Mobility Bed Mobility: Supine to Sit Supine to Sit: 5: Supervision Details for Bed Mobility Assistance: Verbal cues for safe sequence. Transfers Transfers: Sit to Stand;Stand to Sit Sit to Stand: 5: Supervision;With upper extremity assist;From bed Stand to Sit: 5: Supervision;With upper extremity assist;To chair/3-in-1 Details for Transfer Assistance: Verbal cues for safest hand placement. Ambulation/Gait Ambulation/Gait Assistance: 4: Min assist Ambulation Distance (Feet): 80 Feet Assistive device: Rolling walker Ambulation/Gait Assistance Details: Slight assist due to initial imbalance with pt c/o slight lightheadedness (just waking up).  Pt progressed to min (guard).  Distance limited by DOE and fatigue. Gait Pattern: Step-through pattern;Decreased stride length Stairs: No Wheelchair Mobility Wheelchair Mobility: No    Exercises     PT Diagnosis: Difficulty walking;Generalized weakness  PT Problem List: Decreased strength;Decreased activity tolerance;Decreased balance;Decreased  mobility;Decreased knowledge of use of DME PT Treatment Interventions: DME instruction;Gait training;Stair training;Functional mobility training;Therapeutic activities;Balance training;Patient/family education   PT Goals Acute Rehab PT Goals PT Goal Formulation: With patient Time For Goal Achievement: 01/18/12 Potential to Achieve Goals: Good Pt will go Supine/Side to Sit: with modified independence PT Goal: Supine/Side to Sit - Progress: Goal set today Pt will go Sit to Supine/Side: with modified independence PT Goal: Sit to Supine/Side - Progress: Goal set today Pt will go Sit to Stand: with modified independence PT Goal: Sit to Stand - Progress: Goal set today Pt will go Stand to Sit: with modified independence PT Goal: Stand to Sit - Progress: Goal set today Pt will Ambulate: >150 feet;with modified independence;with least restrictive assistive device PT Goal: Ambulate - Progress: Goal set today Pt will Go Up / Down Stairs: 6-9 stairs;with supervision;with least restrictive assistive device PT Goal: Up/Down Stairs - Progress: Goal set today  Visit Information  Last PT Received On: 01/11/12 Assistance Needed: +1    Subjective Data  Subjective: "I just woke up." Patient Stated Goal: Go home.   Prior Functioning  Home Living Lives With: Family Available Help at Discharge: Family;Available PRN/intermittently Type of Home: House Home Access: Stairs to enter [employer] of Steps: 8 Entrance Stairs-Rails: Right Home Layout: One level Home Adaptive Equipment: None Prior Function Level of Independence: Independent Able to Take Stairs?: Yes Driving: Yes Vocation: Retired Musician: No difficulties    Cognition  Overall Cognitive Status: Appears within functional limits for tasks assessed/performed Arousal/Alertness: Awake/alert Orientation Level: Appears intact for tasks assessed Behavior During Session: [facility] for tasks performed      Extremity/Trunk Assessment Right Upper Extremity Assessment RUE ROM/Strength/Tone: Within functional levels RUE Sensation: WFL - Light Touch RUE Coordination:  WFL - gross motor Left Upper Extremity Assessment LUE ROM/Strength/Tone: Within functional levels LUE Sensation: WFL - Light Touch LUE Coordination: WFL - gross motor Right Lower Extremity Assessment RLE ROM/Strength/Tone: Within functional levels RLE Sensation: WFL - Light Touch RLE Coordination: WFL - gross motor Left Lower Extremity Assessment LLE ROM/Strength/Tone: Within functional levels LLE Sensation: WFL - Light Touch LLE Coordination: WFL - gross motor Trunk Assessment Trunk Assessment: Normal   Balance Balance Balance Assessed: No  End of Session PT - End of Session Equipment Utilized During Treatment: Gait belt Activity Tolerance: Patient tolerated treatment well Patient left: in chair;with call bell/phone within reach Nurse Communication: Mobility status  GP     Cephus Shelling 01/11/2012, 9:42 AM  01/11/2012 Cephus Shelling, PT, DPT [phone]

## 2012-01-24 ENCOUNTER — Emergency Department (HOSPITAL_COMMUNITY): Payer: Medicare Other

## 2012-01-24 ENCOUNTER — Emergency Department (HOSPITAL_COMMUNITY)
Admission: EM | Admit: 2012-01-24 | Discharge: 2012-01-24 | Disposition: A | Discharge status: Home / Self Care | Payer: Medicare Other | Attending: Emergency Medicine | Admitting: Emergency Medicine

## 2012-01-24 ENCOUNTER — Encounter (HOSPITAL_COMMUNITY): Payer: Self-pay | Admitting: *Deleted

## 2012-01-24 DIAGNOSIS — R079 Chest pain, unspecified: Secondary | ICD-10-CM | POA: Insufficient documentation

## 2012-01-24 DIAGNOSIS — R197 Diarrhea, unspecified: Secondary | ICD-10-CM | POA: Insufficient documentation

## 2012-01-24 DIAGNOSIS — E876 Hypokalemia: Secondary | ICD-10-CM | POA: Insufficient documentation

## 2012-01-24 DIAGNOSIS — E119 Type 2 diabetes mellitus without complications: Secondary | ICD-10-CM | POA: Insufficient documentation

## 2012-01-24 DIAGNOSIS — I10 Essential (primary) hypertension: Secondary | ICD-10-CM | POA: Insufficient documentation

## 2012-01-24 DIAGNOSIS — J45909 Unspecified asthma, uncomplicated: Secondary | ICD-10-CM | POA: Insufficient documentation

## 2012-01-24 DIAGNOSIS — Z7982 Long term (current) use of aspirin: Secondary | ICD-10-CM | POA: Insufficient documentation

## 2012-01-24 DIAGNOSIS — Z79899 Other long term (current) drug therapy: Secondary | ICD-10-CM | POA: Insufficient documentation

## 2012-01-24 DIAGNOSIS — R0602 Shortness of breath: Secondary | ICD-10-CM | POA: Insufficient documentation

## 2012-01-24 LAB — CBC WITH DIFFERENTIAL/PLATELET
Basophils Absolute: 0 10*3/uL (ref 0.0–0.1)
Basophils Relative: 0 % (ref 0–1)
Eosinophils Absolute: 0.2 10*3/uL (ref 0.0–0.7)
Eosinophils Relative: 2 % (ref 0–5)
HCT: 35.8 % — ABNORMAL LOW (ref 36.0–46.0)
Hemoglobin: 12.1 g/dL (ref 12.0–15.0)
Lymphocytes Relative: 45 % (ref 12–46)
Lymphs Abs: 4.1 10*3/uL — ABNORMAL HIGH (ref 0.7–4.0)
MCH: 28.9 pg (ref 26.0–34.0)
MCHC: 33.8 g/dL (ref 30.0–36.0)
MCV: 85.4 fL (ref 78.0–100.0)
Monocytes Absolute: 0.6 10*3/uL (ref 0.1–1.0)
Monocytes Relative: 6 % (ref 3–12)
Neutro Abs: 4.4 10*3/uL (ref 1.7–7.7)
Neutrophils Relative %: 47 % (ref 43–77)
Platelets: 263 10*3/uL (ref 150–400)
RBC: 4.19 MIL/uL (ref 3.87–5.11)
RDW: 13.2 % (ref 11.5–15.5)
WBC: 9.3 10*3/uL (ref 4.0–10.5)

## 2012-01-24 LAB — POCT I-STAT, CHEM 8
BUN: 18 mg/dL (ref 6–23)
Calcium, Ion: 1.21 mmol/L (ref 1.12–1.23)
Chloride: 102 mEq/L (ref 96–112)
Creatinine, Ser: 1.2 mg/dL — ABNORMAL HIGH (ref 0.50–1.10)
Glucose, Bld: 105 mg/dL — ABNORMAL HIGH (ref 70–99)
HCT: 37 % (ref 36.0–46.0)
Hemoglobin: 12.6 g/dL (ref 12.0–15.0)
Potassium: 2.9 mEq/L — ABNORMAL LOW (ref 3.5–5.1)
Sodium: 143 mEq/L (ref 135–145)
TCO2: 28 mmol/L (ref 0–100)

## 2012-01-24 LAB — POCT I-STAT TROPONIN I: Troponin i, poc: 0.01 ng/mL (ref 0.00–0.08)

## 2012-01-24 LAB — PRO B NATRIURETIC PEPTIDE: Pro B Natriuretic peptide (BNP): 29.8 pg/mL (ref 0–125)

## 2012-01-24 MED ORDER — POTASSIUM CHLORIDE CRYS ER 20 MEQ PO TBCR
40.0000 meq | EXTENDED_RELEASE_TABLET | Freq: Once | ORAL | Status: AC
  Administered 2012-01-24: 40 meq via ORAL
  Filled 2012-01-24: qty 2

## 2012-01-24 MED ORDER — ASPIRIN 81 MG PO CHEW
324.0000 mg | CHEWABLE_TABLET | Freq: Once | ORAL | Status: AC
  Administered 2012-01-24: 324 mg via ORAL
  Filled 2012-01-24: qty 4

## 2012-01-24 MED ORDER — POTASSIUM CHLORIDE CRYS ER 10 MEQ PO TBCR: 20.0000 meq | EXTENDED_RELEASE_TABLET | Freq: Two times a day (BID) | ORAL | Status: DC

## 2012-01-24 MED ORDER — HYDROCODONE-ACETAMINOPHEN 5-325 MG PO TABS
2.0000 | ORAL_TABLET | Freq: Once | ORAL | Status: AC
  Administered 2012-01-24: 2 via ORAL
  Filled 2012-01-24: qty 2

## 2012-01-24 NOTE — ED Notes (Addendum)
C/o chest pressure, also some nausea, some sob, chills & diarrhea (x6 today). Chest pressure onset around 1900.  H/o CHF. (denies: v, fever, cough congestion cold sx, swelling), LS CTA, speaking in clear complete sentences, cap refill <2sec, scant pedal edema possible, pitting +1. Significant cardiopulmonary hx with multiple recent visits for the same.

## 2012-01-24 NOTE — ED Provider Notes (Signed)
History     CSN: [id number]  Arrival date & time 01/24/12  0158   First MD Initiated Contact with Patient 01/24/12 0208      Chief Complaint  Patient presents with  . Chest Pain  . Dizziness  . Shortness of Breath  . Diarrhea  . Chills    (Consider location/radiation/quality/duration/timing/severity/associated sxs/prior treatment) HPI 51 year old female presents to emergency room complaining of chest pain starting at 7 PM tonight while fully closed. Pain is substernal worse with moving,, deep breaths. Patient reports shortness of breath is at her baseline. She has had nausea but no diaphoresis. Patient reports she has had 6 episodes of loose stools. There's been no blood or mucus in the stools. She has had no fevers. No unusual foods, no sick contacts, no travel. She has not taken anything for the diarrhea. She denies any bowel pain aside from cramping just prior to the diarrhea. Patient with recent admission for chest pain, told she had congestive heart failure she is pending a visit with a new PCP. She does not have a cardiologist.  Past Medical History  Diagnosis Date  . Hypertension   . Diabetes mellitus   . Asthma   . Anginal pain     a. NL cath in 2008;  b. Myoview 03/2011: dec uptake along mid anterior wall on stress imaging -> ? attenuation vs. ischemia, EF 65%;  c. Echo 04/2011: EF 55-60%, no RWMA, Gr 2 dd  . Depression   . Anxiety   . Pulmonary embolism     a. 2008 -> coumadin x 6 mos.  . Sleep apnea   . 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 Obesity     Past Surgical History  Procedure Date  . Abdominal hysterectomy 2005  . Leg surgery   . Cardiac catheterization     Normal  . Tubal ligation 1989  . Left knee surgery 2008    Family History  Problem Relation Age of Onset  . Emphysema Mother   . Asthma Brother   . Heart disease Paternal Grandfather   . Heart disease Father     died @ 87's.  . Arthritis Mother   . Colon cancer Maternal  Grandfather   . Heart failure Mother     alive @ 74    History  Substance Use Topics  . Smoking status: Former Smoker -- 0.5 packs/day for 15 years    Types: Cigarettes  . Smokeless tobacco: Not on file  . Alcohol Use: No    OB History    Grav Para Term Preterm Abortions TAB SAB Ect Mult Living                  Review of Systems  All other systems reviewed and are negative.    Allergies  Penicillins and Sulfa antibiotics  Home Medications   Current Outpatient Rx  Name Route Sig Dispense Refill  . ALBUTEROL 90 MCG/ACT IN AERS Inhalation Inhale 2 puffs into the lungs 3 (three) times daily as needed. For shortness of breath     . ASPIRIN 81 MG PO TABS Oral Take 81 mg by mouth daily.      . ATORVASTATIN CALCIUM 20 MG PO TABS Oral Take 20 mg by mouth at bedtime.     [name] CARVEDILOL PHOSPHATE ER 10 MG PO CP24 Oral Take 1 capsule (10 mg total) by mouth daily. 60 capsule 3  . CYCLOBENZAPRINE HCL 10 MG PO TABS  Oral Take 10 mg by mouth daily.      [name] DIAZEPAM 5 MG PO TABS Oral Take 5 mg by mouth at bedtime.      . FUROSEMIDE 40 MG PO TABS Oral Take 1 tablet (40 mg total) by mouth daily. 60 tablet 3  . HYDROCODONE-ACETAMINOPHEN 5-325 MG PO TABS Oral Take 1 tablet by mouth every 6 (six) hours as needed. For pain 30 tablet 0  . IBUPROFEN 200 MG PO TABS Oral Take 200 mg by mouth every 6 (six) hours as needed. For pain    . ISOSORBIDE DINITRATE 20 MG PO TABS Oral Take 20 mg by mouth 2 (two) times daily.     [name] LIDOCAINE 5 % EX PTCH Transdermal Place 1 patch onto the skin daily. Remove & Discard patch within 12 hours or as directed by MD 30 patch 1  . LISINOPRIL 10 MG PO TABS Oral Take 2 tablets (20 mg total) by mouth daily. 60 tablet 3  . LORATADINE 10 MG PO TABS Oral Take 10 mg by mouth daily.    [name] METFORMIN HCL 1000 MG PO TABS Oral Take 1,000 mg by mouth 2 (two) times daily with a meal.    . MULTI-VITAMIN/MINERALS PO TABS Oral Take 1 tablet by mouth daily.      [name] NITROGLYCERIN 0.4 MG SL  SUBL Sublingual Place 0.4 mg under the tongue every 5 (five) minutes as needed. For chest pain.    [name] OMEPRAZOLE 20 MG PO CPDR Oral Take 20 mg by mouth daily.      [name] PROBIOTIC DAILY PO Oral Take 1 capsule by mouth daily.    [name] POTASSIUM CHLORIDE CRYS ER 10 MEQ PO TBCR Oral Take 2 tablets (20 mEq total) by mouth 2 (two) times daily. 30 tablet 0  . SERTRALINE HCL 100 MG PO TABS Oral Take 100 mg by mouth daily.       BP 126/69  Pulse 81  Temp 98.4 F (36.9 C) (Oral)  Resp 20  SpO2 96%  Physical Exam  Nursing note and vitals reviewed. Constitutional: She is oriented to person, place, and time. She appears well-developed and well-nourished.  HENT:  Head: Normocephalic and atraumatic.  Nose: Nose normal.  Mouth/Throat: Oropharynx is clear and moist.  Eyes: Conjunctivae normal and EOM are normal. Pupils are equal, round, and reactive to light.  Neck: Normal range of motion. Neck supple. No JVD present. No tracheal deviation present. No thyromegaly present.  Cardiovascular: Normal rate, regular rhythm, normal heart sounds and intact distal pulses.  Exam reveals no gallop and no friction rub.   No murmur heard. Pulmonary/Chest: Effort normal and breath sounds normal. No stridor. No respiratory distress. She has no wheezes. She has no rales. She exhibits tenderness (Palpation of sternum reproduces the pain).  Abdominal: Soft. Bowel sounds are normal. She exhibits no distension and no mass. There is no tenderness. There is no rebound and no guarding.  Musculoskeletal: Normal range of motion. She exhibits edema (trace in bilateral lower extremities). She exhibits no tenderness.  Lymphadenopathy:    She has no cervical adenopathy.  Neurological: She is oriented to person, place, and time. She has normal reflexes. No cranial nerve deficit. She exhibits normal muscle tone. Coordination normal.  Skin: Skin is warm and dry. No rash noted. No erythema. No pallor.  Psychiatric: She has a normal mood and  affect. Her behavior is normal. Judgment and thought content normal.    ED Course  Procedures (including critical care time)  K 2.9. Troponin 0.01  Labs Reviewed  CBC WITH DIFFERENTIAL - Abnormal; Notable for the following:    HCT 35.8 (*)     Lymphs Abs 4.1 (*)     All other components within normal limits  PRO B NATRIURETIC PEPTIDE  LAB REPORT - SCANNED   Dg Chest 2 View  01/24/2012  *RADIOLOGY REPORT*  Clinical Data: Chest pain  CHEST - 2 VIEW  Comparison: 01/08/2012 CT  Findings: Heart size upper normal to mildly enlarged.  Central vascular congestion and mild interstitial prominence / central peribronchial thickening.  No pleural effusion or pneumothorax.  No acute osseous finding.  IMPRESSION: Heart size upper normal with central vascular congestion.  Mild peribronchial thickening may reflect edema or bronchitic change.   Original Report Authenticated By: Waneta Martins, M.D.     Date: 01/24/2012  Rate: 81  Rhythm: normal sinus rhythm  QRS Axis: normal  Intervals: normal  ST/T Wave abnormalities: nonspecific ST/T changes  Conduction Disutrbances:none  Narrative Interpretation:   Old EKG Reviewed: unchanged    1. Chest pain   2. Hypokalemia       MDM  51 yo female with chest pain.  Pt with history of same, recent admission and seen by cardiology at that time.  She has had constant pain since onset at 7 pm.  Pain worse with movement, palpation.  No ischemic changes on EKG.  Troponin negative.  Will have patient f/u with pcm.        Olivia Mackie, MD 01/24/12 [phone]

## 2012-03-30 IMAGING — CR DG CHEST 2V
2 series · 2 of 2 positions shown · non-contrast
Comparison: 02/14/2010

CLINICAL DATA: Chest pain, nausea

CHEST - 2 VIEW

[w chest pa]
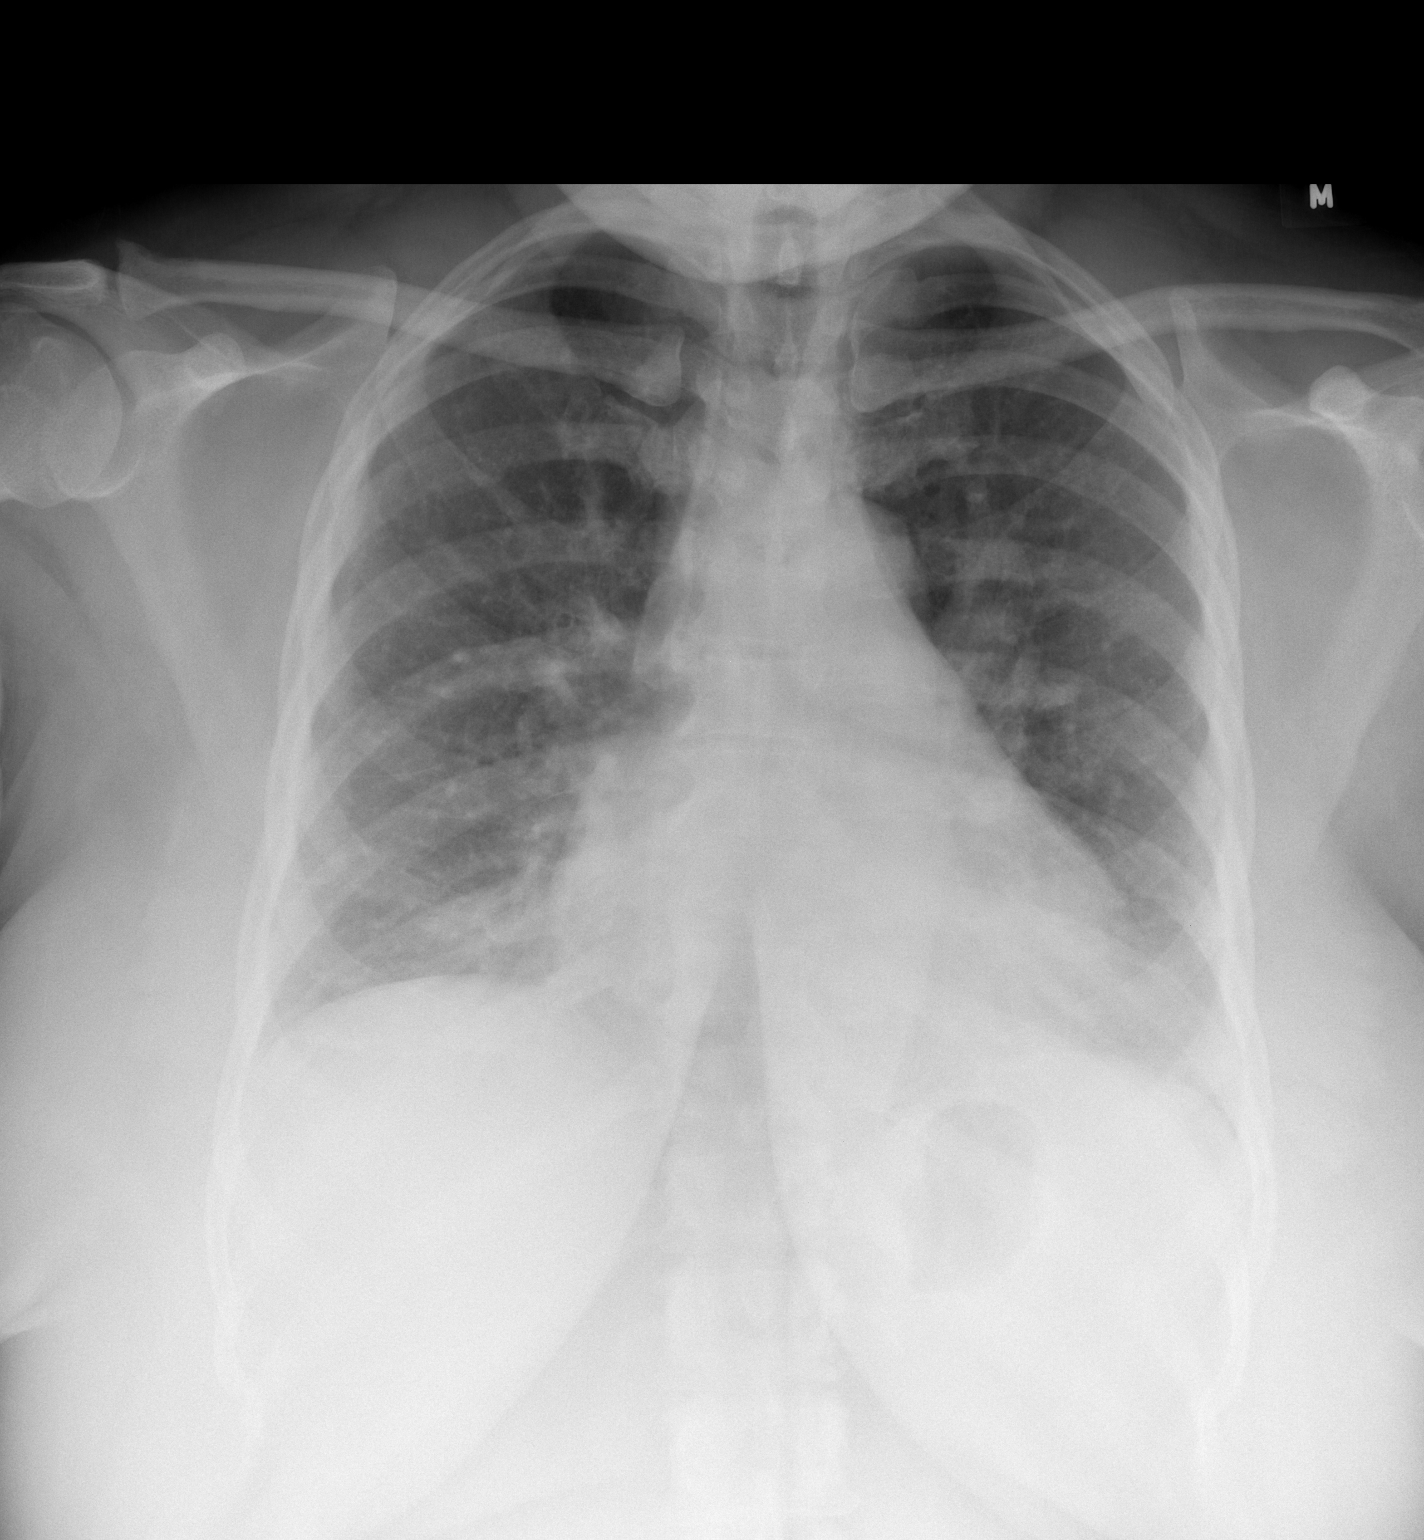

[w chest lat *]
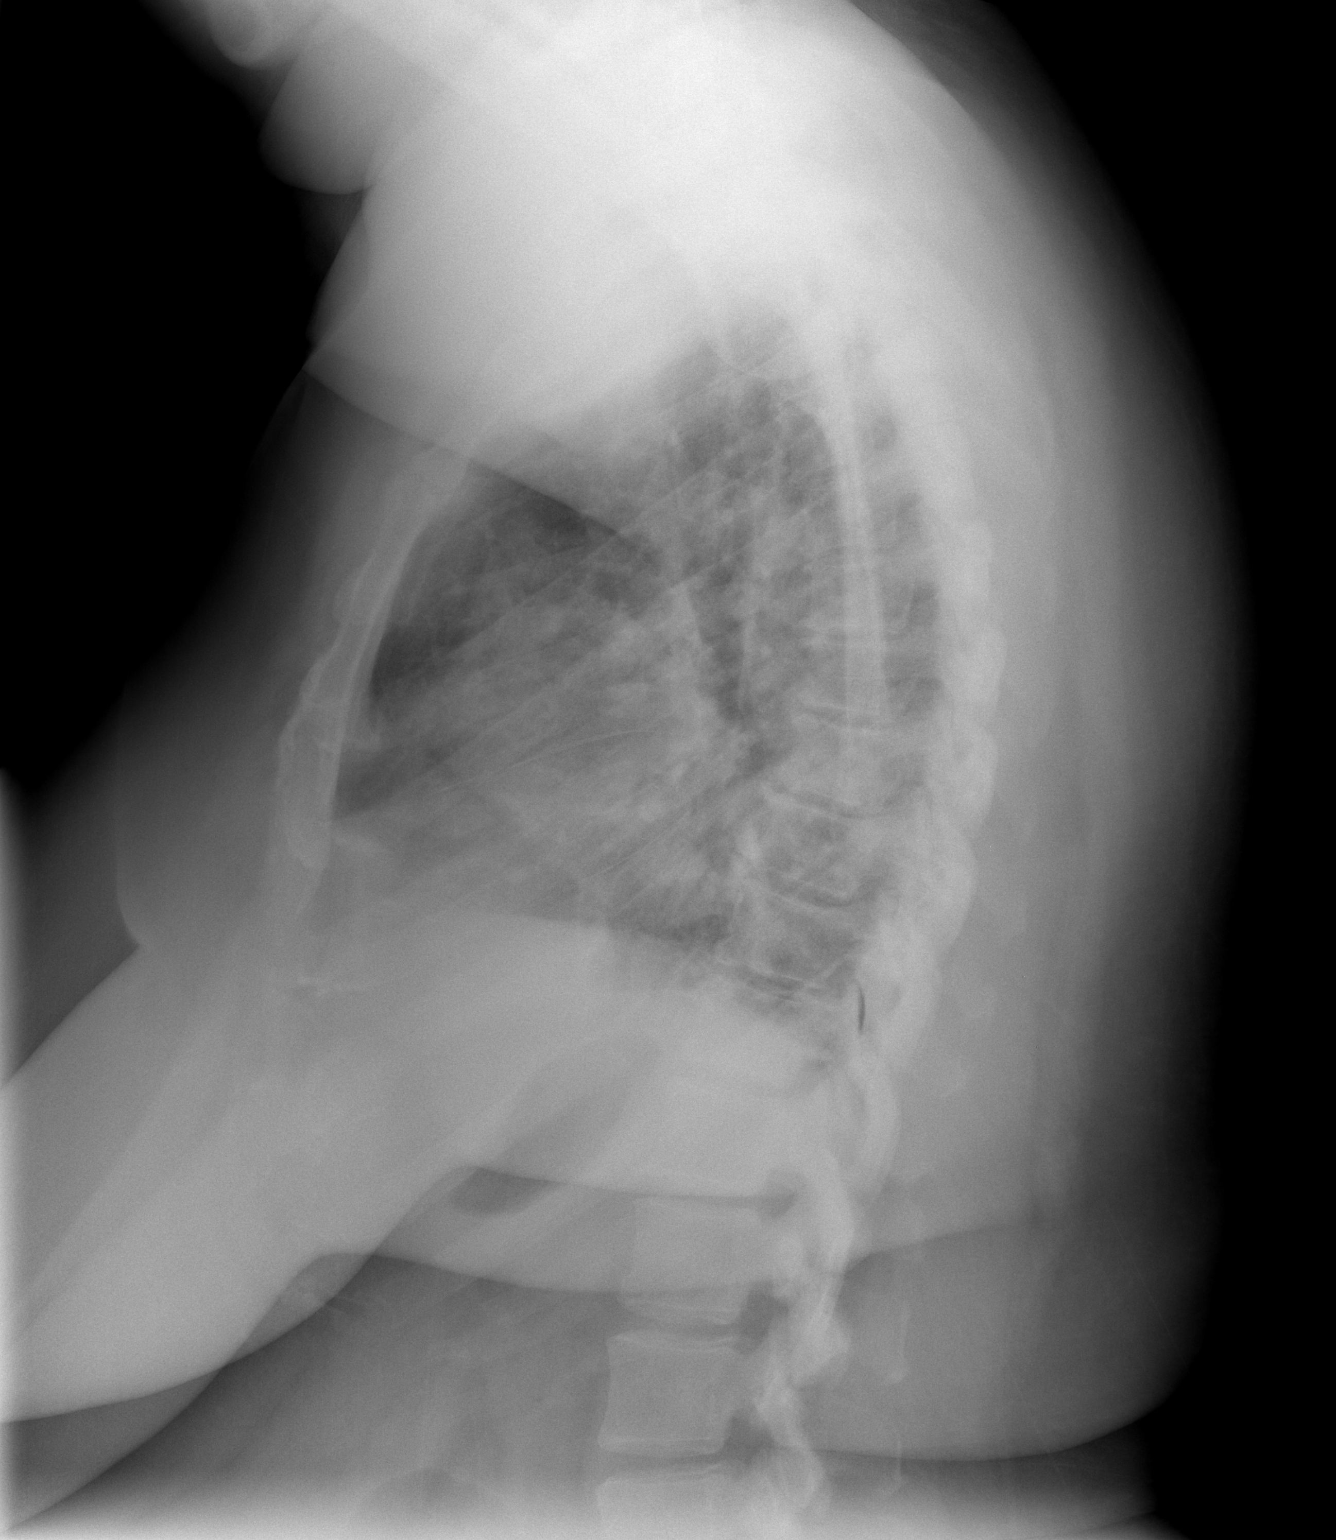

[2 of 2 positions shown; findings below may reference images not displayed]

FINDINGS: Patchy subsegmental atelectasis versus early infiltrates
in the lower lobes, obscuring portions of the diaphragmatic
leaflet.  Heart size upper limits normal.  No effusion.  Mild
central pulmonary vascular congestion.
IMPRESSION: 1.  Bibasilar subsegmental atelectasis or early infiltrates.
2.  Borderline cardiomegaly.

## 2012-04-01 IMAGING — CT CT MAXILLOFACIAL W/ CM
3 of 4 series · 16 of 47 positions shown, 19 images · IV contrast (agent unspecified)
Comparison: None

CLINICAL DATA: Left facial swelling following tooth extraction.
Fever.

CT MAXILLOFACIAL WITH CONTRAST
TECHNIQUE: Multidetector CT imaging of the maxillofacial
structures was performed with intravenous contrast. Multiplanar CT
image reconstructions were also generated.
Contrast: 80 ml [id number]

[Series 3: recon 2: supine facial bones · axial · 0.42mm/px · z∈[+31,+176]mm · 10 of 68 slices shown, 13 images]
[im 5/68  brain]
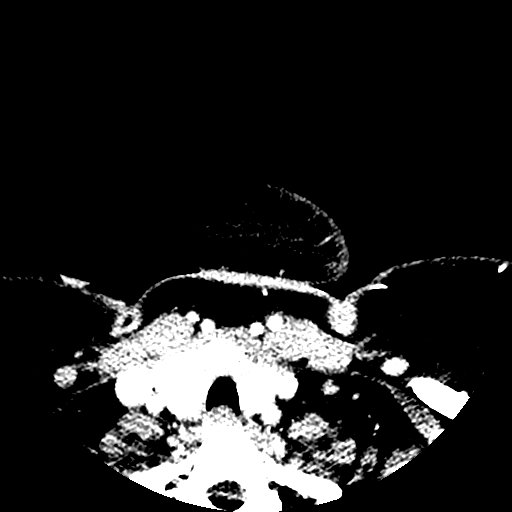
[im 5/68  bone]
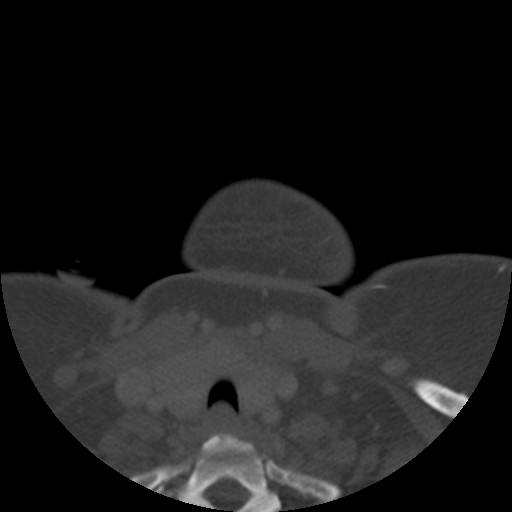
[im 12/68  bone]
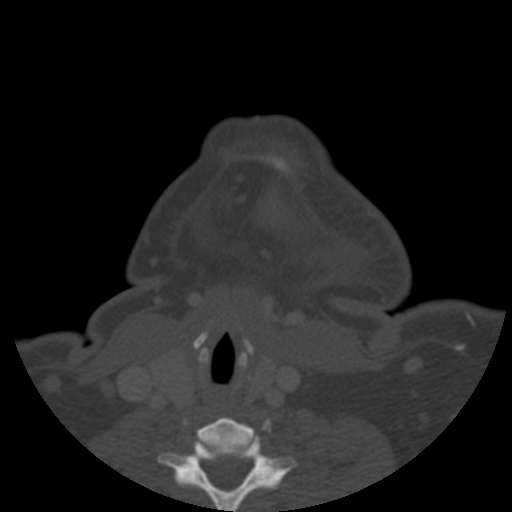
[im 19/68  bone]
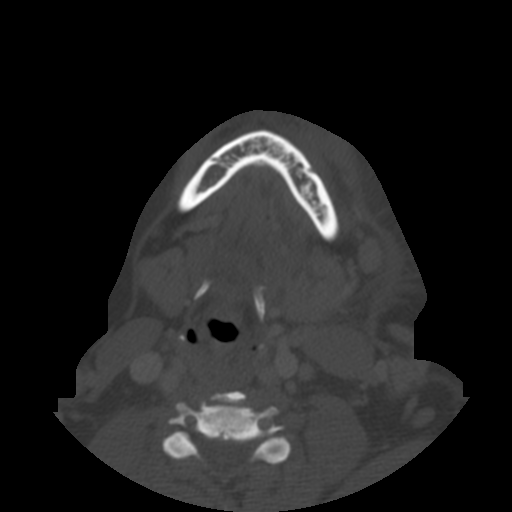
[im 24/68  bone]
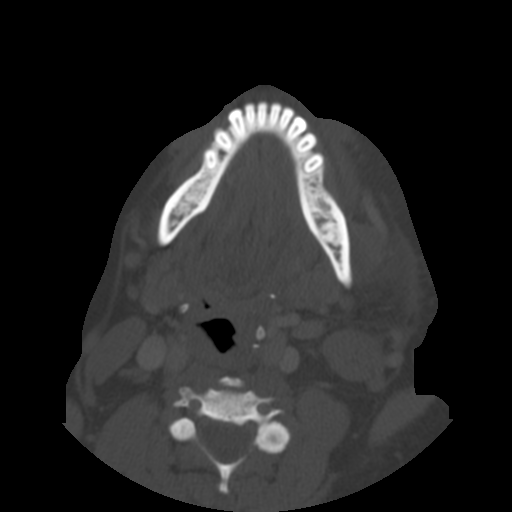
[im 31/68  brain]
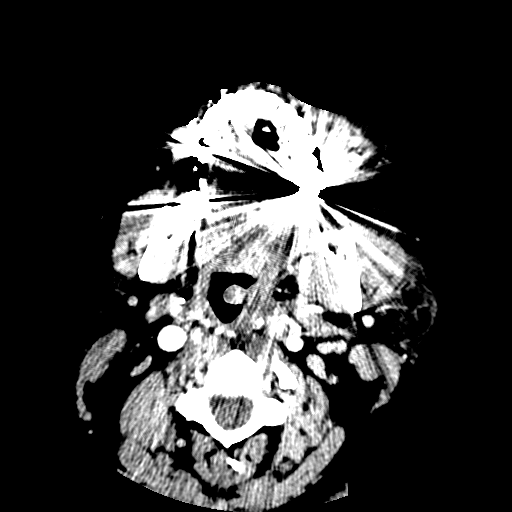
[im 31/68  bone]
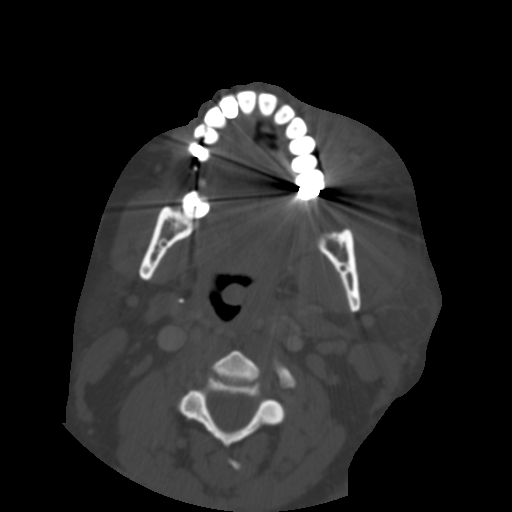
[im 37/68  bone]
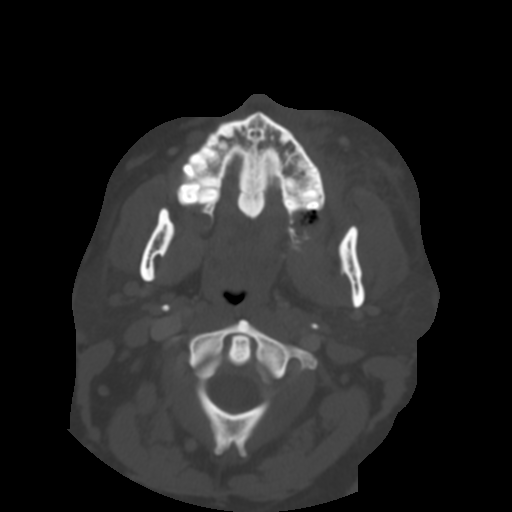
[im 44/68  bone]
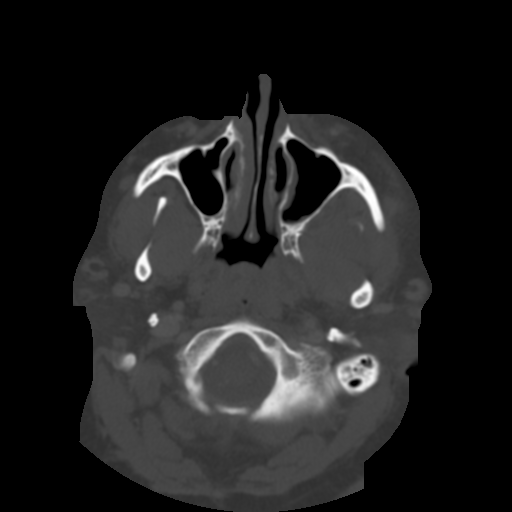
[im 51/68  bone]
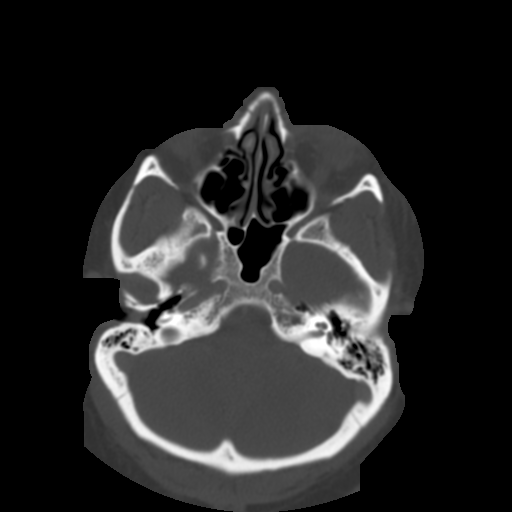
[im 56/68  brain]
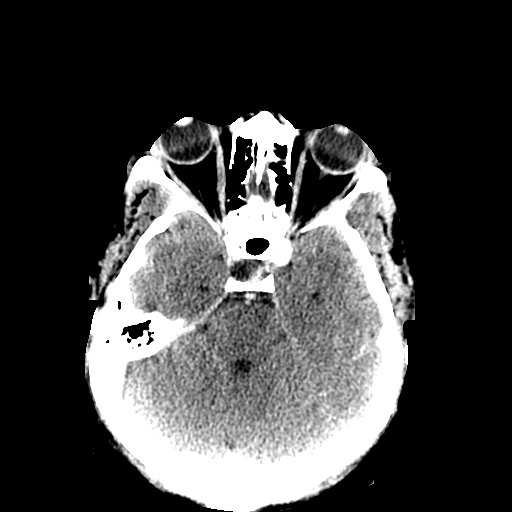
[im 56/68  bone]
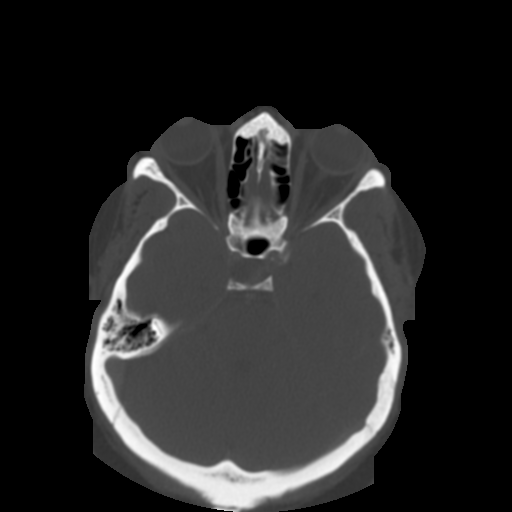
[im 63/68  bone]
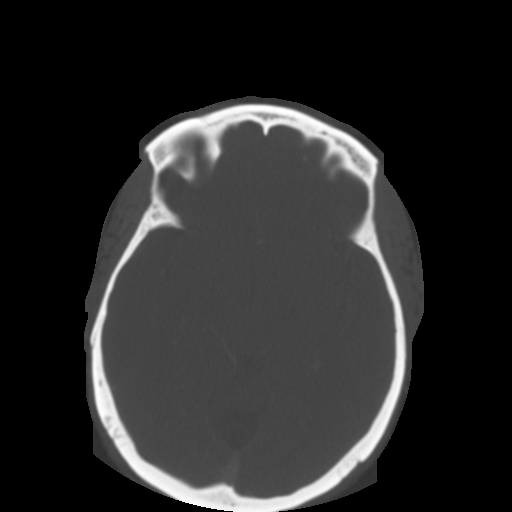

[Series 103: sag st · sagittal · 0.42mm/px · 3 of 88 slices shown]
[im 30/88  bone]
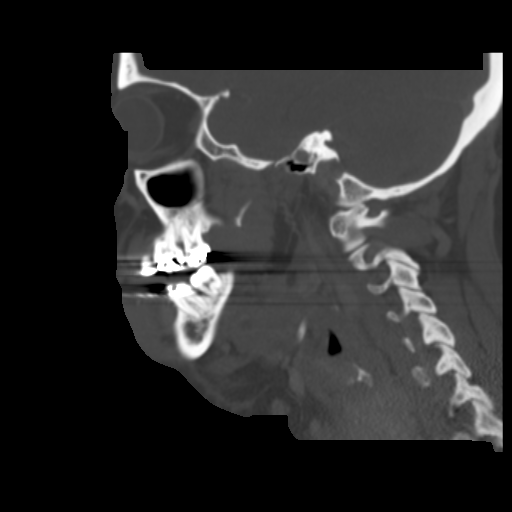
[im 44/88  bone]
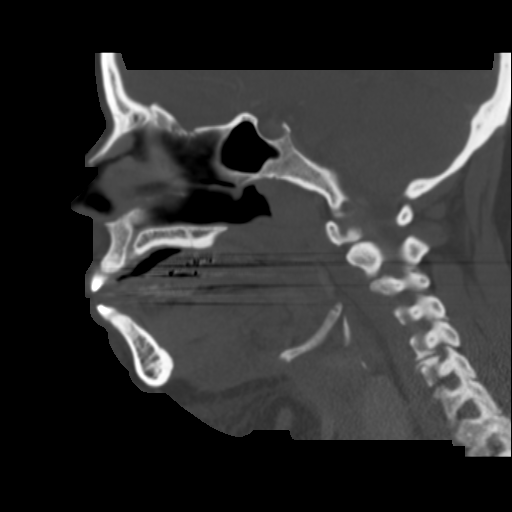
[im 59/88  bone]
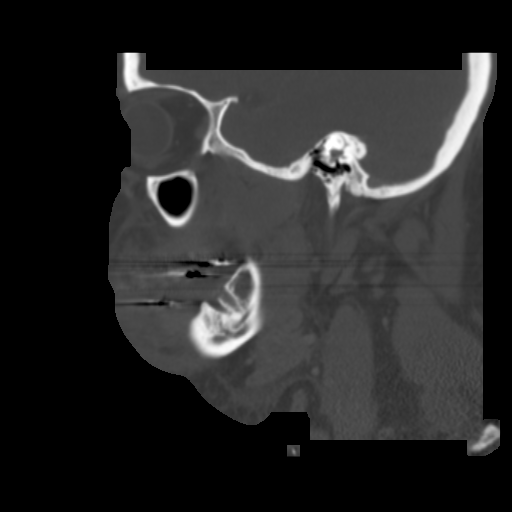

[Series 104: cor st · coronal · 0.42mm/px · 3 of 83 slices shown]
[im 21/83  bone]
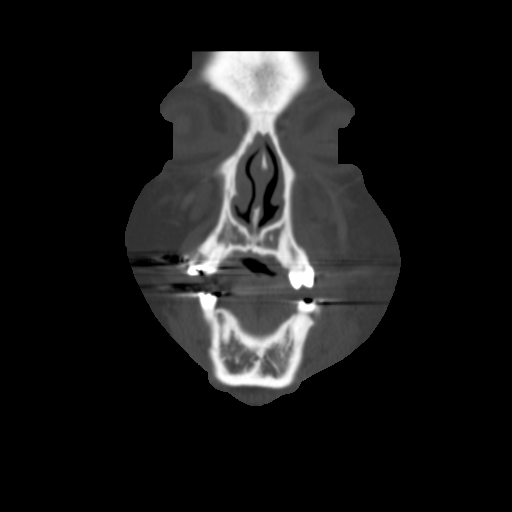
[im 42/83  bone]
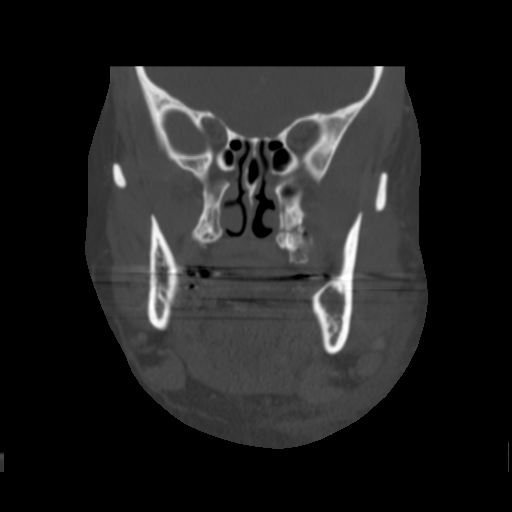
[im 62/83  bone]
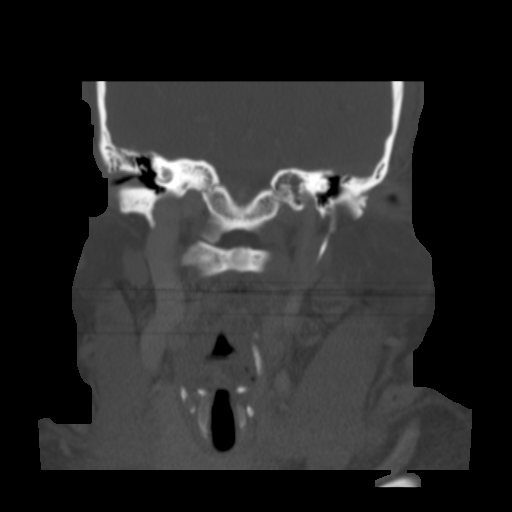

[16 of 47 positions shown; findings below may reference images not displayed]

FINDINGS: There is  indistinct and nonspecific soft tissue edema
within the subcutaneous fatty tissues of the left face.  No sign of
drainable superficial collection.  There is swelling and edema
within the muscles of the masticator space on the left.  Again,
there is no definable drainable collection.  No extension of
inflammation to the parapharyngeal space.  Parotid and
submandibular glands appear normal.  There are a few reactive lymph
nodes on the left, level I and level II, but no evidence of
suppurative nodes.  There has been dental extraction of tooth
number 16.  The extraction socket can be seen to contain some fluid
and gas, not unexpected.  There is cortical breakthrough laterally,
and this is quite likely the site of origin of the inflammatory
process.

Limited visualization of the intracranial contents does not show
any abnormality.  No inflammatory sinus disease.
IMPRESSION: Recent extraction of tooth number 16.  Fluid and air / gas within
the extraction socket.  Cortical breakthrough laterally.
Inflammation of the adjacent muscles of the masticator space and
nonspecific inflammation of the subcutaneous fatty tissues of the
left face.  I do not identify a drainable abscess in either area.
There are a few reactive regional lymph nodes without suppuration.

## 2012-04-03 IMAGING — CR DG CHEST 2V
2 series · 2 of 2 positions shown · non-contrast
Comparison: 05/26/2010

CLINICAL DATA: Shortness of breath.  Syncope.  Bibasilar
atelectasis.

CHEST - 2 VIEW

[w chest pa]
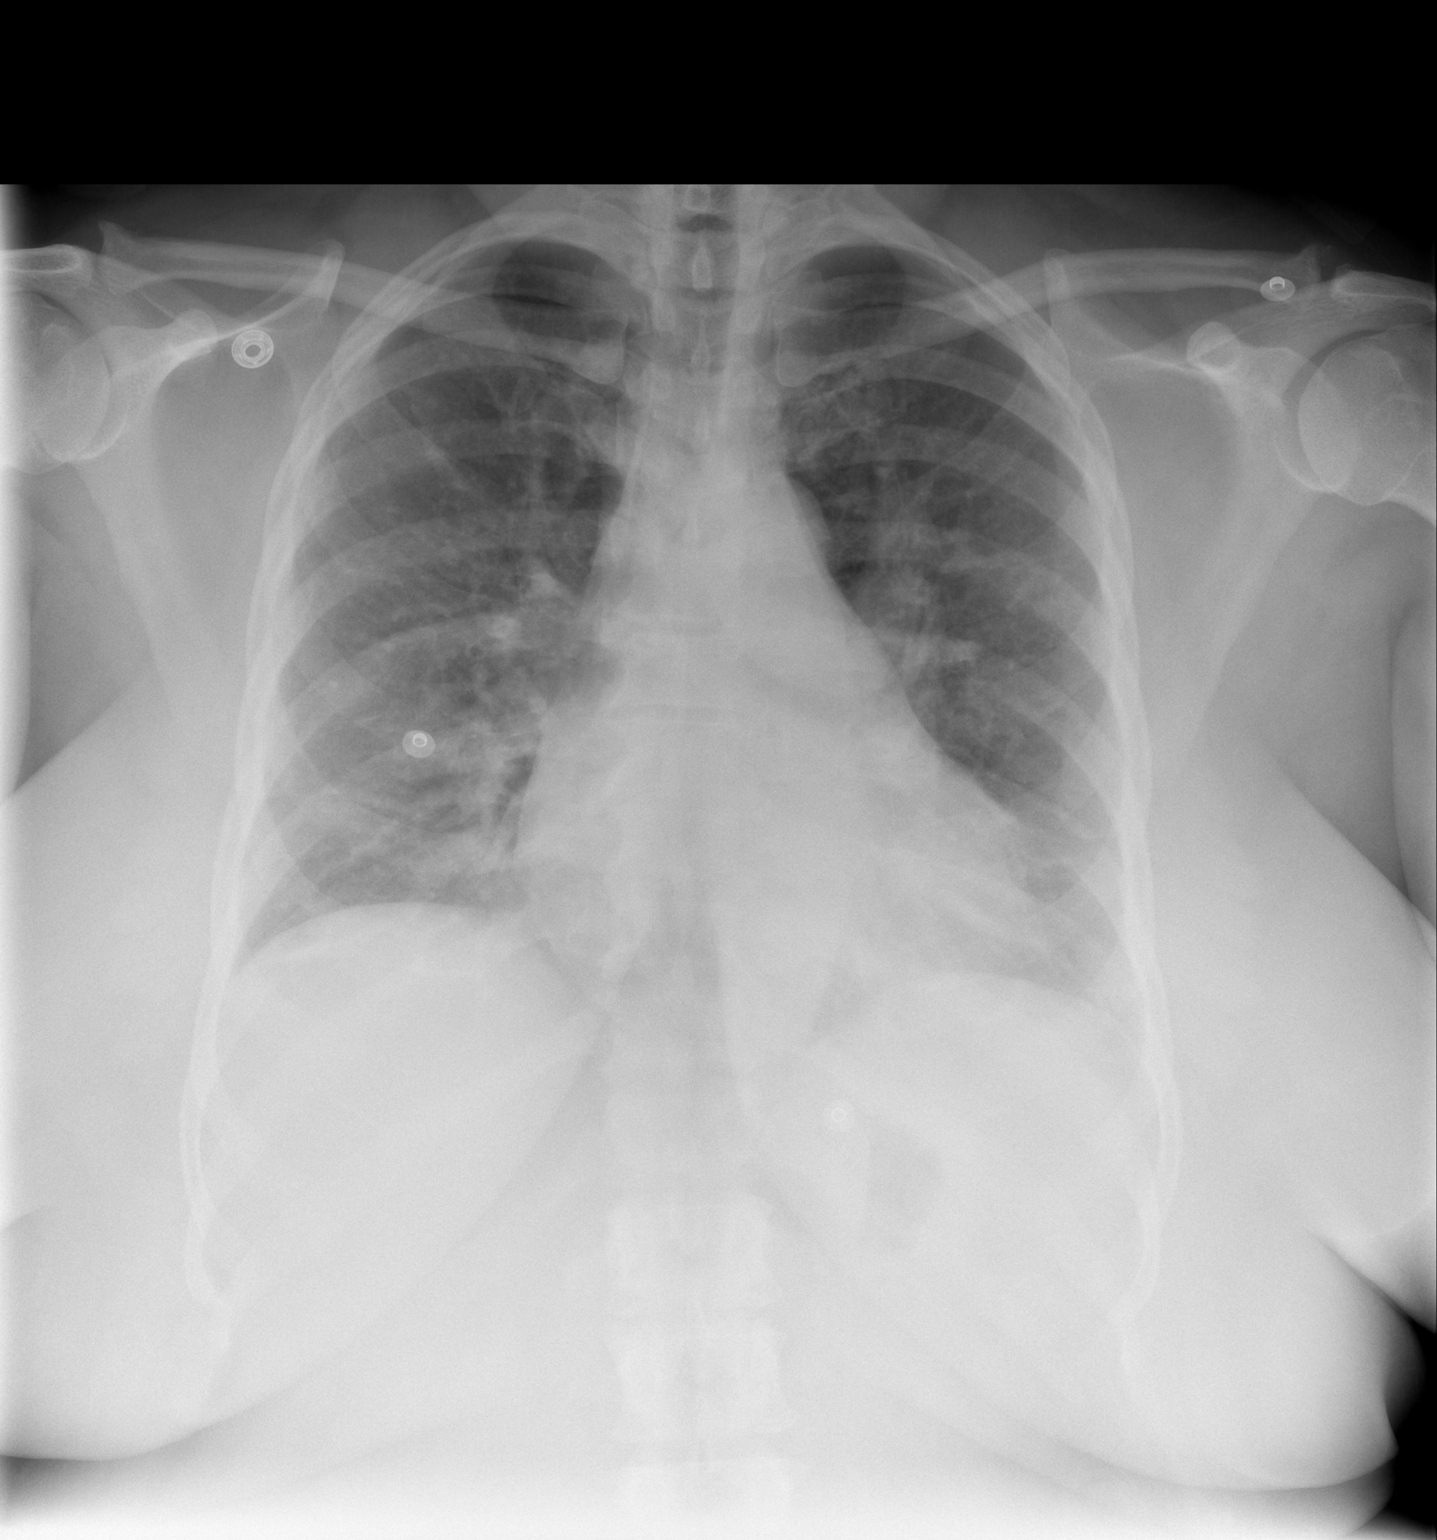

[w chest lat *]
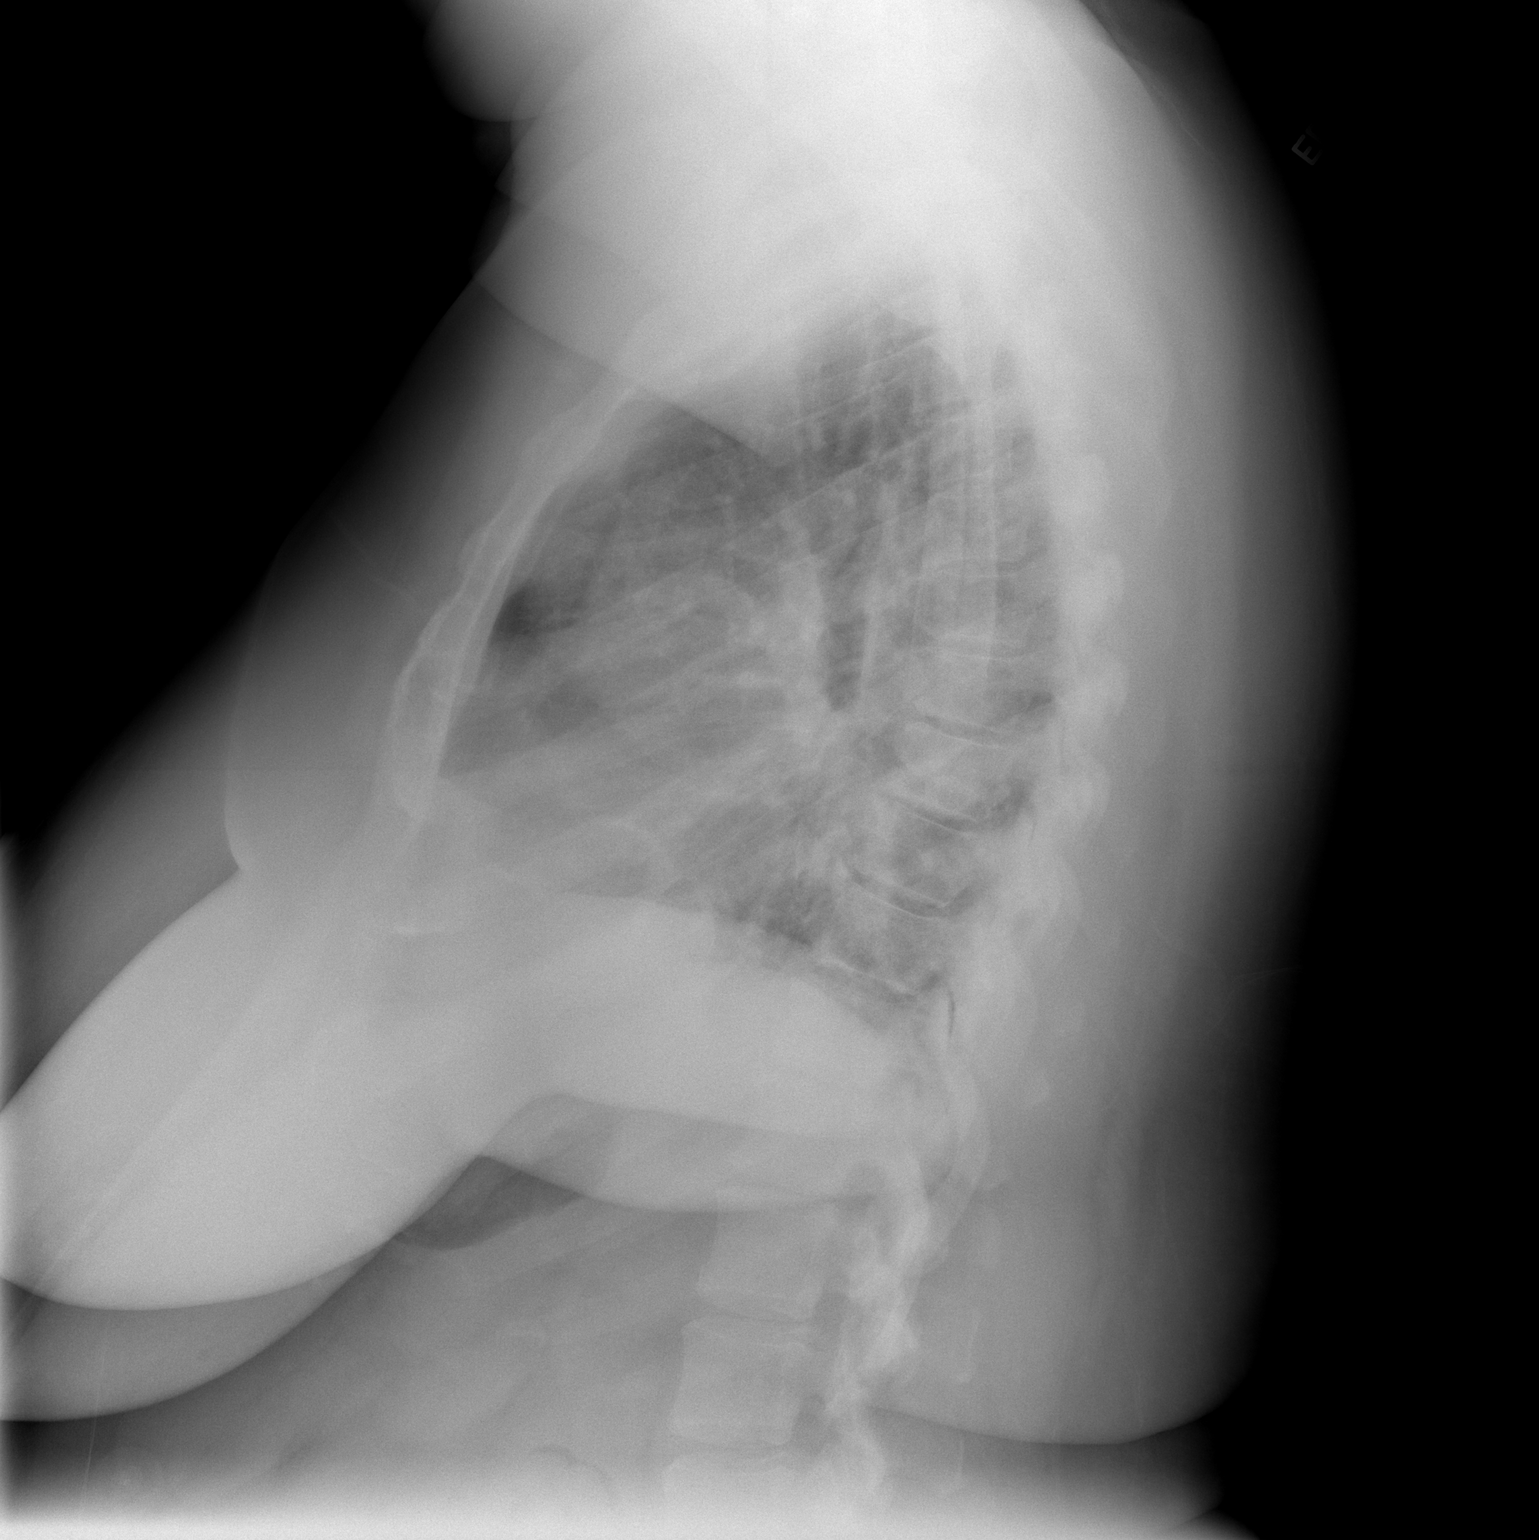

[2 of 2 positions shown; findings below may reference images not displayed]

FINDINGS: The heart size and vascularity are within normal limits.
Mild atelectasis at the bases has improved.  There is peribronchial
thickening.
IMPRESSION: Mild atelectasis, improved.

## 2012-04-08 ENCOUNTER — Emergency Department (HOSPITAL_COMMUNITY)
Admission: EM | Admit: 2012-04-08 | Discharge: 2012-04-08 | Discharge status: Against Medical Advice | Payer: Medicare Other | Attending: Emergency Medicine | Admitting: Emergency Medicine

## 2012-04-08 ENCOUNTER — Encounter (HOSPITAL_COMMUNITY): Payer: Self-pay | Admitting: *Deleted

## 2012-04-08 DIAGNOSIS — L909 Atrophic disorder of skin, unspecified: Secondary | ICD-10-CM | POA: Insufficient documentation

## 2012-04-08 HISTORY — DX: Heart failure, unspecified: I50.9

## 2012-04-08 NOTE — ED Notes (Signed)
Pt has an infected skin tag on right upper abdomen

## 2012-04-08 NOTE — ED Notes (Signed)
Registration informed nursing that pt left. "Will come back tomorrow morning".

## 2012-04-19 ENCOUNTER — Emergency Department (INDEPENDENT_AMBULATORY_CARE_PROVIDER_SITE_OTHER)
Admission: EM | Admit: 2012-04-19 | Discharge: 2012-04-19 | Disposition: A | Discharge status: Nursing Home (Includes ICF) | Payer: Medicare Other | Source: Home / Self Care

## 2012-04-19 ENCOUNTER — Emergency Department (HOSPITAL_COMMUNITY): Payer: Medicare Other

## 2012-04-19 ENCOUNTER — Encounter (HOSPITAL_COMMUNITY): Payer: Self-pay | Admitting: *Deleted

## 2012-04-19 ENCOUNTER — Emergency Department (HOSPITAL_COMMUNITY)
Admission: EM | Admit: 2012-04-19 | Discharge: 2012-04-20 | Disposition: A | Discharge status: Home / Self Care | Payer: Medicare Other | Attending: Emergency Medicine | Admitting: Emergency Medicine

## 2012-04-19 DIAGNOSIS — F329 Major depressive disorder, single episode, unspecified: Secondary | ICD-10-CM | POA: Insufficient documentation

## 2012-04-19 DIAGNOSIS — J40 Bronchitis, not specified as acute or chronic: Secondary | ICD-10-CM | POA: Insufficient documentation

## 2012-04-19 DIAGNOSIS — F411 Generalized anxiety disorder: Secondary | ICD-10-CM | POA: Insufficient documentation

## 2012-04-19 DIAGNOSIS — F3289 Other specified depressive episodes: Secondary | ICD-10-CM | POA: Insufficient documentation

## 2012-04-19 DIAGNOSIS — J45909 Unspecified asthma, uncomplicated: Secondary | ICD-10-CM | POA: Insufficient documentation

## 2012-04-19 DIAGNOSIS — R0600 Dyspnea, unspecified: Secondary | ICD-10-CM

## 2012-04-19 DIAGNOSIS — Z79899 Other long term (current) drug therapy: Secondary | ICD-10-CM | POA: Insufficient documentation

## 2012-04-19 DIAGNOSIS — R059 Cough, unspecified: Secondary | ICD-10-CM | POA: Insufficient documentation

## 2012-04-19 DIAGNOSIS — Z8669 Personal history of other diseases of the nervous system and sense organs: Secondary | ICD-10-CM | POA: Insufficient documentation

## 2012-04-19 DIAGNOSIS — R079 Chest pain, unspecified: Secondary | ICD-10-CM

## 2012-04-19 DIAGNOSIS — I10 Essential (primary) hypertension: Secondary | ICD-10-CM | POA: Insufficient documentation

## 2012-04-19 DIAGNOSIS — Z86711 Personal history of pulmonary embolism: Secondary | ICD-10-CM | POA: Insufficient documentation

## 2012-04-19 DIAGNOSIS — R0609 Other forms of dyspnea: Secondary | ICD-10-CM

## 2012-04-19 DIAGNOSIS — E669 Obesity, unspecified: Secondary | ICD-10-CM | POA: Insufficient documentation

## 2012-04-19 DIAGNOSIS — I509 Heart failure, unspecified: Secondary | ICD-10-CM | POA: Insufficient documentation

## 2012-04-19 DIAGNOSIS — R05 Cough: Secondary | ICD-10-CM | POA: Insufficient documentation

## 2012-04-19 DIAGNOSIS — R0989 Other specified symptoms and signs involving the circulatory and respiratory systems: Secondary | ICD-10-CM

## 2012-04-19 DIAGNOSIS — E119 Type 2 diabetes mellitus without complications: Secondary | ICD-10-CM | POA: Insufficient documentation

## 2012-04-19 DIAGNOSIS — R0789 Other chest pain: Secondary | ICD-10-CM | POA: Insufficient documentation

## 2012-04-19 DIAGNOSIS — Z87891 Personal history of nicotine dependence: Secondary | ICD-10-CM | POA: Insufficient documentation

## 2012-04-19 DIAGNOSIS — Z7982 Long term (current) use of aspirin: Secondary | ICD-10-CM | POA: Insufficient documentation

## 2012-04-19 LAB — COMPREHENSIVE METABOLIC PANEL
ALT: 25 U/L (ref 0–35)
AST: 18 U/L (ref 0–37)
Albumin: 3.6 g/dL (ref 3.5–5.2)
Alkaline Phosphatase: 54 U/L (ref 39–117)
BUN: 17 mg/dL (ref 6–23)
CO2: 30 mEq/L (ref 19–32)
Calcium: 9.8 mg/dL (ref 8.4–10.5)
Chloride: 101 mEq/L (ref 96–112)
Creatinine, Ser: 0.9 mg/dL (ref 0.50–1.10)
GFR calc Af Amer: 84 mL/min — ABNORMAL LOW (ref >90)
GFR calc non Af Amer: 72 mL/min — ABNORMAL LOW (ref >90)
Glucose, Bld: 101 mg/dL — ABNORMAL HIGH (ref 70–99)
Potassium: 3.5 mEq/L (ref 3.5–5.1)
Sodium: 142 mEq/L (ref 135–145)
Total Bilirubin: 0.2 mg/dL — ABNORMAL LOW (ref 0.3–1.2)
Total Protein: 7.1 g/dL (ref 6.0–8.3)

## 2012-04-19 LAB — CBC WITH DIFFERENTIAL/PLATELET
Basophils Absolute: 0 10*3/uL (ref 0.0–0.1)
Basophils Relative: 0 % (ref 0–1)
Eosinophils Absolute: 0.3 10*3/uL (ref 0.0–0.7)
Eosinophils Relative: 3 % (ref 0–5)
HCT: 38.3 % (ref 36.0–46.0)
Hemoglobin: 12.5 g/dL (ref 12.0–15.0)
Lymphocytes Relative: 51 % — ABNORMAL HIGH (ref 12–46)
Lymphs Abs: 3.9 10*3/uL (ref 0.7–4.0)
MCH: 28.4 pg (ref 26.0–34.0)
MCHC: 32.6 g/dL (ref 30.0–36.0)
MCV: 87 fL (ref 78.0–100.0)
Monocytes Absolute: 0.5 10*3/uL (ref 0.1–1.0)
Monocytes Relative: 6 % (ref 3–12)
Neutro Abs: 3 10*3/uL (ref 1.7–7.7)
Neutrophils Relative %: 39 % — ABNORMAL LOW (ref 43–77)
Platelets: 267 10*3/uL (ref 150–400)
RBC: 4.4 MIL/uL (ref 3.87–5.11)
RDW: 13.7 % (ref 11.5–15.5)
WBC: 7.6 10*3/uL (ref 4.0–10.5)

## 2012-04-19 LAB — D-DIMER, QUANTITATIVE (NOT AT ARMC): D-Dimer, Quant: 0.67 ug/mL-FEU — ABNORMAL HIGH (ref 0.00–0.48)

## 2012-04-19 LAB — POCT I-STAT TROPONIN I: Troponin i, poc: 0.01 ng/mL (ref 0.00–0.08)

## 2012-04-19 LAB — PRO B NATRIURETIC PEPTIDE: Pro B Natriuretic peptide (BNP): 21.5 pg/mL (ref 0–125)

## 2012-04-19 MED ORDER — SODIUM CHLORIDE 0.9 % IV SOLN
Freq: Once | INTRAVENOUS | Status: AC
  Administered 2012-04-19: 21:00:00 via INTRAVENOUS

## 2012-04-19 NOTE — ED Notes (Signed)
Lab at [facility] 

## 2012-04-19 NOTE — ED Notes (Signed)
To xray, no changes, calm, NAD.

## 2012-04-19 NOTE — ED Notes (Signed)
C/o congestion and chest pain in her back when breathing, and SOB onset yesterday.

## 2012-04-19 NOTE — ED Notes (Signed)
[facility] not available.  [facility] called to transport pt. To the ED.  Pt.'s daughter notified also.

## 2012-04-19 NOTE — ED Notes (Signed)
EDP into room 

## 2012-04-19 NOTE — ED Notes (Signed)
No changes, NAD, calm, resps e/u, speaking in clear complete setnences, ambulatory in room, steady gait, up to b/r via w/c.

## 2012-04-19 NOTE — ED Notes (Signed)
Old and new ECG given to Dr. Effie Shy

## 2012-04-19 NOTE — ED Notes (Signed)
EDP into room to discuss d/c plan.

## 2012-04-19 NOTE — ED Notes (Signed)
Pt here by EMS from [facility], IV established PTA and patent, here for CP with inspiration and sob, ongoing for last 3d, other family members with bronchitis, rhonchi thoughout noted, alert, NAD, calm, interactive, ambulatory with steady gait. Sent from [facility] for "concern for EKG changes & worsening CHF". NSR on monitor.

## 2012-04-19 NOTE — ED Notes (Signed)
Pt up to b/r via w/c, no changes, NAD.

## 2012-04-19 NOTE — ED Provider Notes (Signed)
[name] is a 51 y.o. female who presents to Urgent Care today for shortness of breath and chest pain.  Patient notes cough headache and as previously noted chest pain and shortness of breath. The symptoms initially started Tuesday however the chest pain began yesterday and has worsened.  She describes her chest pain is nonexertional however she notes that her shortness of breath does worse with exertion and laying flat on her back.  She has tried some over-the-counter medications for pain which have not improved.  She is worried that she may have bronchitis.  She denies any fevers or chills.  She denies any worsening lower extremity edema.    PMH reviewed. Significant for diastolic heart failure, history of PE, sleep apnea and former smoking status History  Substance Use Topics  . Smoking status: Former Smoker -- 0.5 packs/day for 15 years    Types: Cigarettes  . Smokeless tobacco: Not on file  . Alcohol Use: No   ROS as above Medications reviewed. No current facility-administered medications for this encounter.   Current Outpatient Prescriptions  Medication Sig Dispense Refill  . aspirin 81 MG tablet Take 81 mg by mouth daily.        [name] atorvastatin (LIPITOR) 20 MG tablet Take 20 mg by mouth at bedtime.       . carvedilol (COREG CR) 10 MG 24 hr capsule Take 1 capsule (10 mg total) by mouth daily.  60 capsule  3  . cyclobenzaprine (FLEXERIL) 10 MG tablet Take 10 mg by mouth daily.        . diazepam (VALIUM) 5 MG tablet Take 5 mg by mouth at bedtime.        . furosemide (LASIX) 40 MG tablet Take 1 tablet (40 mg total) by mouth daily.  60 tablet  3  . HYDROcodone-acetaminophen (NORCO/VICODIN) 5-325 MG per tablet Take 1 tablet by mouth every 6 (six) hours as needed. For pain  30 tablet  0  . isosorbide dinitrate (ISORDIL) 20 MG tablet Take 20 mg by mouth 2 (two) times daily.       [name] lidocaine (LIDODERM) 5 % Place 1 patch onto the skin daily. Remove & Discard patch within 12 hours or  as directed by MD  30 patch  1  . lisinopril (PRINIVIL) 10 MG tablet Take 2 tablets (20 mg total) by mouth daily.  60 tablet  3  . loratadine (CLARITIN) 10 MG tablet Take 10 mg by mouth daily.      . metFORMIN (GLUCOPHAGE) 1000 MG tablet Take 1,000 mg by mouth 2 (two) times daily with a meal.      . Multiple Vitamins-Minerals (MULTIVITAMIN WITH MINERALS) tablet Take 1 tablet by mouth daily.        [name] omeprazole (PRILOSEC) 20 MG capsule Take 20 mg by mouth daily.        . potassium chloride (K-DUR,KLOR-CON) 10 MEQ tablet Take 2 tablets (20 mEq total) by mouth 2 (two) times daily.  30 tablet  0  . Probiotic Product (PROBIOTIC DAILY PO) Take 1 capsule by mouth daily.      . sertraline (ZOLOFT) 100 MG tablet Take 100 mg by mouth daily.       [name] albuterol (PROVENTIL,VENTOLIN) 90 MCG/ACT inhaler Inhale 2 puffs into the lungs 3 (three) times daily as needed. For shortness of breath       . ibuprofen (ADVIL,MOTRIN) 200 MG tablet Take 200 mg by mouth every 6 (six) hours as needed. For pain      .  nitroGLYCERIN (NITROSTAT) 0.4 MG SL tablet Place 0.4 mg under the tongue every 5 (five) minutes as needed. For chest pain.        Exam:  BP 127/76  Pulse 86  Temp 97.9 F (36.6 C) (Oral)  Resp 28  SpO2 96% Gen: Well NAD HEENT: EOMI,  MMM.  Increased JVD Lungs: CTABL increased work of breathing Heart: RRR no MRG Abd: NABS, NT, ND Exts: Non edematous BL  LE, warm and well perfused.   No results found for this or any previous visit (from the past 24 hour(s)). No results found.  Twelve-lead EKG shows normal sinus rhythm at 79 beats per minute.  Normal P waves with normal PR interval.  QRS duration is 98 ms with bifid appearance in leads II, III, aVF and aVL.  She is T wave inversion in the lateral precordial leads.  She has no ST segment elevation or depression.  No Q waves.  Assessment and Plan: 51 y.o. female with diastolic heart failure with chest pain shortness of breath. Additionally patient has a  cough.  Impression: The patient may have bronchitis, However she has an abnormal EKG with change from baseline, orthopnea and worsening shortness of breath with chest pain.  Additionally she has a history significant for heart failure and for PE.  I feel that it is reasonable to transfer to the emergency room for cardiac evaluation and workup.   Patient expresses understanding and agreement.  Transfer to ER via [facility].     Rodolph Bong, MD 04/19/12 2033

## 2012-04-19 NOTE — ED Provider Notes (Signed)
Medical screening examination/treatment/procedure(s) were performed by a resident physician and as supervising physician I was immediately available for consultation/collaboration.  Leslee Home, M.D.   Reuben Likes, MD 04/19/12 [phone]

## 2012-04-19 NOTE — ED Provider Notes (Signed)
History     CSN: [id number]  Arrival date & time 04/19/12  2108   First MD Initiated Contact with Patient 04/19/12 2220      Chief Complaint  Patient presents with  . Shortness of Breath  . Chest Pain    (Consider location/radiation/quality/duration/timing/severity/associated sxs/prior treatment) HPI Comments: [name] is a 51 y.o. Female who complains of chest pain, and shortness of breath, for several days. He was seen at an urgent care today, and sent here. There are no known aggravating factors. She denies fever , or chills today. She has a nonproductive cough. She's using her usual medication, without relief. There are no known palliative factors.  Patient is a 51 y.o. female presenting with shortness of breath and chest pain. The history is provided by the patient.  Shortness of Breath  Associated symptoms include chest pain and shortness of breath.  Chest Pain Primary symptoms include shortness of breath.     Past Medical History  Diagnosis Date  . Hypertension   . Diabetes mellitus   . Asthma   . Anginal pain     a. NL cath in 2008;  b. Myoview 03/2011: dec uptake along mid anterior wall on stress imaging -> ? attenuation vs. ischemia, EF 65%;  c. Echo 04/2011: EF 55-60%, no RWMA, Gr 2 dd  . Depression   . Anxiety   . Pulmonary embolism     a. 2008 -> coumadin x 6 mos.  . Sleep apnea   . 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 Obesity   . CHF (congestive heart failure)     Past Surgical History  Procedure Date  . Abdominal hysterectomy 2005  . Leg surgery   . Cardiac catheterization     Normal  . Tubal ligation 1989  . Left knee surgery 2008    Family History  Problem Relation Age of Onset  . Emphysema Mother   . Asthma Brother   . Heart disease Paternal Grandfather   . Heart disease Father     died @ 65's.  . Arthritis Mother   . Colon cancer Maternal Grandfather   . Heart failure Mother     alive @ 70    History    Substance Use Topics  . Smoking status: Former Smoker -- 0.5 packs/day for 15 years    Types: Cigarettes  . Smokeless tobacco: Not on file  . Alcohol Use: No    OB History    Grav Para Term Preterm Abortions TAB SAB Ect Mult Living                  Review of Systems  Respiratory: Positive for shortness of breath.   Cardiovascular: Positive for chest pain.  All other systems reviewed and are negative.    Allergies  Penicillins and Sulfa antibiotics  Home Medications   Current Outpatient Rx  Name  Route  Sig  Dispense  Refill  . ALBUTEROL 90 MCG/ACT IN AERS   Inhalation   Inhale 2 puffs into the lungs 3 (three) times daily as needed. For shortness of breath          . ASPIRIN 81 MG PO TABS   Oral   Take 81 mg by mouth daily.           . ATORVASTATIN CALCIUM 20 MG PO TABS   Oral   Take 20 mg by mouth at bedtime.          [name]  CARVEDILOL PHOSPHATE ER 10 MG PO CP24   Oral   Take 1 capsule (10 mg total) by mouth daily.   60 capsule   3   . DIAZEPAM 5 MG PO TABS   Oral   Take 5 mg by mouth at bedtime.           . FUROSEMIDE 40 MG PO TABS   Oral   Take 40 mg by mouth daily.         [name] HYDROCODONE-ACETAMINOPHEN 5-325 MG PO TABS   Oral   Take 1 tablet by mouth every 6 (six) hours as needed. For pain   30 tablet   0   . IBUPROFEN 200 MG PO TABS   Oral   Take 200 mg by mouth every 6 (six) hours as needed. For pain         . ISOSORBIDE DINITRATE 20 MG PO TABS   Oral   Take 20 mg by mouth 2 (two) times daily.          [name] LIDOCAINE 5 % EX PTCH   Transdermal   Place 1 patch onto the skin daily. Remove & Discard patch within 12 hours or as directed by MD         . LISINOPRIL 40 MG PO TABS   Oral   Take 40 mg by mouth 2 (two) times daily.         [name] LISINOPRIL-HYDROCHLOROTHIAZIDE 20-12.5 MG PO TABS   Oral   Take 1 tablet by mouth daily.         [name] LORATADINE 10 MG PO TABS   Oral   Take 10 mg by mouth daily.         [name] METFORMIN HCL 1000  MG PO TABS   Oral   Take 1,000 mg by mouth 2 (two) times daily with a meal.         . MULTI-VITAMIN/MINERALS PO TABS   Oral   Take 1 tablet by mouth daily.           [name] NITROGLYCERIN 0.4 MG SL SUBL   Sublingual   Place 0.4 mg under the tongue every 5 (five) minutes as needed. For chest pain.         [name] OMEPRAZOLE 20 MG PO CPDR   Oral   Take 20 mg by mouth daily.           [name] POTASSIUM CHLORIDE CRYS ER 10 MEQ PO TBCR   Oral   Take 2 tablets (20 mEq total) by mouth 2 (two) times daily.   30 tablet   0   . PROBIOTIC DAILY PO   Oral   Take 1 capsule by mouth daily.         . SERTRALINE HCL 100 MG PO TABS   Oral   Take 100 mg by mouth daily.            BP 108/68  Pulse 72  Temp 98.5 F (36.9 C) (Oral)  Resp 18  SpO2 98%  Physical Exam  Nursing note and vitals reviewed. Constitutional: She is oriented to person, place, and time. She appears well-developed.       Obese, appears older than stated age.  HENT:  Head: Normocephalic and atraumatic.  Eyes: Conjunctivae normal and EOM are normal. Pupils are equal, round, and reactive to light.  Neck: Normal range of motion and phonation normal. Neck supple.  Cardiovascular: Normal rate, regular rhythm and intact distal pulses.   Pulmonary/Chest: Effort normal  and breath sounds normal. No respiratory distress. She has no wheezes. She exhibits no tenderness (diffuse anterior, mild).  Abdominal: Soft. She exhibits no distension. There is no tenderness. There is no guarding.  Musculoskeletal: Normal range of motion. She exhibits no edema and no tenderness.  Neurological: She is alert and oriented to person, place, and time. She has normal strength. She exhibits normal muscle tone.  Skin: Skin is warm and dry.  Psychiatric: She has a normal mood and affect. Her behavior is normal. Judgment and thought content normal.    ED Course  Procedures (including critical care time)       Date: 02/09/2012  Rate: 80   Rhythm: normal sinus rhythm  QRS Axis: normal  PR and QT Intervals: normal  ST/T Wave abnormalities: nonspecific T wave changes  PR and QRS Conduction Disutrbances:none  Narrative Interpretation:   Old EKG Reviewed: unchanged   Labs Reviewed  COMPREHENSIVE METABOLIC PANEL - Abnormal; Notable for the following:    Glucose, Bld 101 (*)     Total Bilirubin 0.2 (*)     GFR calc non Af Amer 72 (*)     GFR calc Af Amer 84 (*)     All other components within normal limits  CBC WITH DIFFERENTIAL - Abnormal; Notable for the following:    Neutrophils Relative 39 (*)     Lymphocytes Relative 51 (*)     All other components within normal limits  D-DIMER, QUANTITATIVE - Abnormal; Notable for the following:    D-Dimer, Quant 0.67 (*)     All other components within normal limits  PRO B NATRIURETIC PEPTIDE  POCT I-STAT TROPONIN I   Dg Chest 2 View  04/19/2012  *RADIOLOGY REPORT*  Clinical Data: Short of breath back pain, hypertension  CHEST - 2 VIEW  Comparison: CT Chest 01/12/2011, chest radiograph 01/08/2012  Findings: Normal cardiac silhouette.  There is coarsened central bronchovascular markings and interstitial thickening similar to prior.  No focal consolidation.  There is increased sclerosis posterior aspect vertebral bodies similar to CT of 2012.  IMPRESSION: . 1.  No acute cardiopulmonary findings.  2.  Chronic bronchitic thickening centrally and interstitial thickening.  3.  Chronic  increased sclerosis of the posterior elements of the thoracic bodies.   Original Report Authenticated By: Genevive Bi, M.D.      1. Bronchitis       MDM  Nonspecific chest pain, with shortness of breath. Patient's d-dimer is slightly elevated, but it is less than 1.0. The patient has had numerous CAT scans Chest, (26  In 6 years); without positive findings for PE. Additional CT scans, we'll continue to negatively impact her lifetime radiation exposure.  It is very unlikely that she has a PE, today.  Her vital signs, and oxygenation status are normal. Blood work does not indicate an acute infectious, or metabolic process. She does not have signs or symptoms of influenza. Chest x-ray may show bronchitis, but she is not wheezing.    Plan: Home Medications- usual; Home Treatments- rest; Recommended follow up- PCP prn   Flint Melter, MD 04/19/12 2352

## 2012-04-19 NOTE — ED Notes (Signed)
Called to assess pt. at registration. Pt. c/o ant. and post. chest pain  onset yesterday.  States she has had a dry cough since Tues. with fever. Pain in post. chest when she breathes.  C/o SOB and sweating. Pt. is HOH.  Pt. is in no distress. States she thinks she has bronchitis.  "Does not feel like her heart."  Hx. Heart failure. [name] M.

## 2012-04-27 ENCOUNTER — Emergency Department (HOSPITAL_COMMUNITY): Payer: Medicare Other

## 2012-04-27 ENCOUNTER — Encounter (HOSPITAL_COMMUNITY): Payer: Self-pay | Admitting: *Deleted

## 2012-04-27 ENCOUNTER — Emergency Department (HOSPITAL_COMMUNITY)
Admission: EM | Admit: 2012-04-27 | Discharge: 2012-04-27 | Disposition: A | Discharge status: Home / Self Care | Payer: Medicare Other | Attending: Emergency Medicine | Admitting: Emergency Medicine

## 2012-04-27 DIAGNOSIS — G4730 Sleep apnea, unspecified: Secondary | ICD-10-CM | POA: Insufficient documentation

## 2012-04-27 DIAGNOSIS — Z7982 Long term (current) use of aspirin: Secondary | ICD-10-CM | POA: Insufficient documentation

## 2012-04-27 DIAGNOSIS — J159 Unspecified bacterial pneumonia: Secondary | ICD-10-CM | POA: Insufficient documentation

## 2012-04-27 DIAGNOSIS — J45909 Unspecified asthma, uncomplicated: Secondary | ICD-10-CM | POA: Insufficient documentation

## 2012-04-27 DIAGNOSIS — E669 Obesity, unspecified: Secondary | ICD-10-CM | POA: Insufficient documentation

## 2012-04-27 DIAGNOSIS — J3489 Other specified disorders of nose and nasal sinuses: Secondary | ICD-10-CM | POA: Insufficient documentation

## 2012-04-27 DIAGNOSIS — R112 Nausea with vomiting, unspecified: Secondary | ICD-10-CM | POA: Insufficient documentation

## 2012-04-27 DIAGNOSIS — R059 Cough, unspecified: Secondary | ICD-10-CM | POA: Insufficient documentation

## 2012-04-27 DIAGNOSIS — R0789 Other chest pain: Secondary | ICD-10-CM | POA: Insufficient documentation

## 2012-04-27 DIAGNOSIS — Z87891 Personal history of nicotine dependence: Secondary | ICD-10-CM | POA: Insufficient documentation

## 2012-04-27 DIAGNOSIS — Z8701 Personal history of pneumonia (recurrent): Secondary | ICD-10-CM | POA: Insufficient documentation

## 2012-04-27 DIAGNOSIS — I509 Heart failure, unspecified: Secondary | ICD-10-CM | POA: Insufficient documentation

## 2012-04-27 DIAGNOSIS — R51 Headache: Secondary | ICD-10-CM | POA: Insufficient documentation

## 2012-04-27 DIAGNOSIS — I10 Essential (primary) hypertension: Secondary | ICD-10-CM | POA: Insufficient documentation

## 2012-04-27 DIAGNOSIS — J189 Pneumonia, unspecified organism: Secondary | ICD-10-CM

## 2012-04-27 DIAGNOSIS — E119 Type 2 diabetes mellitus without complications: Secondary | ICD-10-CM | POA: Insufficient documentation

## 2012-04-27 DIAGNOSIS — F3289 Other specified depressive episodes: Secondary | ICD-10-CM | POA: Insufficient documentation

## 2012-04-27 DIAGNOSIS — F411 Generalized anxiety disorder: Secondary | ICD-10-CM | POA: Insufficient documentation

## 2012-04-27 DIAGNOSIS — F329 Major depressive disorder, single episode, unspecified: Secondary | ICD-10-CM | POA: Insufficient documentation

## 2012-04-27 DIAGNOSIS — Z8679 Personal history of other diseases of the circulatory system: Secondary | ICD-10-CM | POA: Insufficient documentation

## 2012-04-27 DIAGNOSIS — R05 Cough: Secondary | ICD-10-CM | POA: Insufficient documentation

## 2012-04-27 DIAGNOSIS — Z79899 Other long term (current) drug therapy: Secondary | ICD-10-CM | POA: Insufficient documentation

## 2012-04-27 LAB — BASIC METABOLIC PANEL
BUN: 15 mg/dL (ref 6–23)
CO2: 27 mEq/L (ref 19–32)
Calcium: 9.3 mg/dL (ref 8.4–10.5)
Chloride: 104 mEq/L (ref 96–112)
Creatinine, Ser: 0.87 mg/dL (ref 0.50–1.10)
GFR calc Af Amer: 87 mL/min — ABNORMAL LOW (ref >90)
GFR calc non Af Amer: 75 mL/min — ABNORMAL LOW (ref >90)
Glucose, Bld: 135 mg/dL — ABNORMAL HIGH (ref 70–99)
Potassium: 3.7 mEq/L (ref 3.5–5.1)
Sodium: 141 mEq/L (ref 135–145)

## 2012-04-27 LAB — CBC WITH DIFFERENTIAL/PLATELET
Basophils Absolute: 0.1 10*3/uL (ref 0.0–0.1)
Basophils Relative: 1 % (ref 0–1)
Eosinophils Absolute: 0.3 10*3/uL (ref 0.0–0.7)
Eosinophils Relative: 5 % (ref 0–5)
HCT: 36.7 % (ref 36.0–46.0)
Hemoglobin: 11.7 g/dL — ABNORMAL LOW (ref 12.0–15.0)
Lymphocytes Relative: 51 % — ABNORMAL HIGH (ref 12–46)
Lymphs Abs: 2.8 10*3/uL (ref 0.7–4.0)
MCH: 27.7 pg (ref 26.0–34.0)
MCHC: 31.9 g/dL (ref 30.0–36.0)
MCV: 87 fL (ref 78.0–100.0)
Monocytes Absolute: 0.5 10*3/uL (ref 0.1–1.0)
Monocytes Relative: 10 % (ref 3–12)
Neutro Abs: 1.8 10*3/uL (ref 1.7–7.7)
Neutrophils Relative %: 33 % — ABNORMAL LOW (ref 43–77)
Platelets: 216 10*3/uL (ref 150–400)
RBC: 4.22 MIL/uL (ref 3.87–5.11)
RDW: 13.9 % (ref 11.5–15.5)
WBC: 5.6 10*3/uL (ref 4.0–10.5)

## 2012-04-27 MED ORDER — AEROCHAMBER Z-STAT PLUS/MEDIUM MISC
1.0000 | Freq: Once | Status: AC
  Administered 2012-04-27: 1

## 2012-04-27 MED ORDER — IPRATROPIUM BROMIDE 0.02 % IN SOLN
0.5000 mg | Freq: Once | RESPIRATORY_TRACT | Status: AC
  Administered 2012-04-27: 0.5 mg via RESPIRATORY_TRACT
  Filled 2012-04-27: qty 2.5

## 2012-04-27 MED ORDER — ALBUTEROL (5 MG/ML) CONTINUOUS INHALATION SOLN
INHALATION_SOLUTION | RESPIRATORY_TRACT | Status: AC
  Filled 2012-04-27: qty 40

## 2012-04-27 MED ORDER — DEXAMETHASONE SODIUM PHOSPHATE 10 MG/ML IJ SOLN
10.0000 mg | Freq: Once | INTRAMUSCULAR | Status: AC
  Administered 2012-04-27: 10 mg via INTRAVENOUS
  Filled 2012-04-27: qty 1

## 2012-04-27 MED ORDER — ALBUTEROL SULFATE (5 MG/ML) 0.5% IN NEBU
5.0000 mg | INHALATION_SOLUTION | Freq: Once | RESPIRATORY_TRACT | Status: AC
  Administered 2012-04-27: 5 mg via RESPIRATORY_TRACT

## 2012-04-27 MED ORDER — AEROCHAMBER PLUS W/MASK MISC
Status: AC
  Administered 2012-04-27: 1
  Filled 2012-04-27: qty 1

## 2012-04-27 MED ORDER — LEVOFLOXACIN 750 MG PO TABS
750.0000 mg | ORAL_TABLET | Freq: Once | ORAL | Status: AC
  Administered 2012-04-27: 750 mg via ORAL
  Filled 2012-04-27: qty 1

## 2012-04-27 MED ORDER — IOHEXOL 350 MG/ML SOLN
100.0000 mL | Freq: Once | INTRAVENOUS | Status: AC | PRN
  Administered 2012-04-27: 100 mL via INTRAVENOUS

## 2012-04-27 MED ORDER — PREDNISONE 20 MG PO TABS: 40.0000 mg | ORAL_TABLET | Freq: Every day | ORAL | Status: DC

## 2012-04-27 MED ORDER — ALBUTEROL SULFATE (5 MG/ML) 0.5% IN NEBU
5.0000 mg | INHALATION_SOLUTION | Freq: Once | RESPIRATORY_TRACT | Status: AC
  Administered 2012-04-27: 5 mg via RESPIRATORY_TRACT
  Filled 2012-04-27: qty 40

## 2012-04-27 MED ORDER — LEVOFLOXACIN 750 MG PO TABS: 750.0000 mg | ORAL_TABLET | Freq: Every day | ORAL | Status: DC

## 2012-04-27 NOTE — ED Notes (Signed)
Pt ambulated to restroom. 

## 2012-04-27 NOTE — ED Notes (Signed)
Patient dx with bronchitis on Monday.  She completed zpack yesterday.  Patient reports she continues to have sob and cough.  She did feel better on yesterday.  Patient is worse today with pink tinged mucous.  Patient denies fever.  Sat 98 percent on room air

## 2012-04-27 NOTE — ED Provider Notes (Signed)
History     CSN: [id number]  Arrival date & time 04/27/12  0845   First MD Initiated Contact with Patient 04/27/12 [phone]      Chief Complaint  Patient presents with  . Shortness of Breath    (Consider location/radiation/quality/duration/timing/severity/associated sxs/prior treatment) HPI Comments: Patient with history of pulmonary embolism, recent ED visits -- presents with shortness of breath, cough. She was seen initially in emergency department and had a normal workup, discharged with diagnosis of bronchitis. Patient followed up with her primary care physician the following day and was started on azithromycin, Mucinex, ibuprofen. Patient states she was feeling better yesterday but had worsening shortness of breath when she woke this morning. She notes this was associated with wheezing and she took her albuterol inhaler. Patient denies having fever. She has had productive cough. She denies lower extremity swelling or edema. She has not had any other extremity pain. No recent immobilizations or travel. Patient is not on any blood thinning medications. She has had rhinorrhea and nasal congestion. Patient states that she has had a headache and vomiting yesterday. Onset gradual. Course is waxing and waning. Nothing makes symptoms better or worse.  Patient is a 52 y.o. female presenting with shortness of breath. The history is provided by the patient and medical records.  Shortness of Breath  Associated symptoms include rhinorrhea, cough and shortness of breath. Pertinent negatives include no chest pain, no fever and no sore throat.    Past Medical History  Diagnosis Date  . Hypertension   . Diabetes mellitus   . Asthma   . Anginal pain     a. NL cath in 2008;  b. Myoview 03/2011: dec uptake along mid anterior wall on stress imaging -> ? attenuation vs. ischemia, EF 65%;  c. Echo 04/2011: EF 55-60%, no RWMA, Gr 2 dd  . Depression   . Anxiety   . Pulmonary embolism     a. 2008 -> coumadin x  6 mos.  . Sleep apnea   . 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 Obesity   . CHF (congestive heart failure)     Past Surgical History  Procedure Date  . Abdominal hysterectomy 2005  . Leg surgery   . Cardiac catheterization     Normal  . Tubal ligation 1989  . Left knee surgery 2008    Family History  Problem Relation Age of Onset  . Emphysema Mother   . Asthma Brother   . Heart disease Paternal Grandfather   . Heart disease Father     died @ 34's.  . Arthritis Mother   . Colon cancer Maternal Grandfather   . Heart failure Mother     alive @ 31    History  Substance Use Topics  . Smoking status: Former Smoker -- 0.5 packs/day for 15 years    Types: Cigarettes  . Smokeless tobacco: Not on file  . Alcohol Use: No    OB History    Grav Para Term Preterm Abortions TAB SAB Ect Mult Living                  Review of Systems  Constitutional: Negative for fever and chills.  HENT: Positive for congestion and rhinorrhea. Negative for sore throat.   Eyes: Negative for redness.  Respiratory: Positive for cough, chest tightness and shortness of breath.   Cardiovascular: Negative for chest pain and leg swelling.  Gastrointestinal: Positive for nausea and vomiting. Negative  for abdominal pain and diarrhea.  Genitourinary: Negative for dysuria.  Musculoskeletal: Negative for myalgias.  Skin: Negative for rash.  Neurological: Positive for headaches. Negative for syncope.    Allergies  Penicillins and Sulfa antibiotics  Home Medications   Current Outpatient Rx  Name  Route  Sig  Dispense  Refill  . ALBUTEROL 90 MCG/ACT IN AERS   Inhalation   Inhale 2 puffs into the lungs 3 (three) times daily as needed. For shortness of breath          . ASPIRIN 81 MG PO TABS   Oral   Take 81 mg by mouth daily.           . ATORVASTATIN CALCIUM 20 MG PO TABS   Oral   Take 20 mg by mouth at bedtime.          [name] CARVEDILOL PHOSPHATE ER 10 MG PO CP24    Oral   Take 1 capsule (10 mg total) by mouth daily.   60 capsule   3   . DIAZEPAM 5 MG PO TABS   Oral   Take 5 mg by mouth at bedtime.           . FUROSEMIDE 40 MG PO TABS   Oral   Take 40 mg by mouth daily.         [name] HYDROCODONE-ACETAMINOPHEN 5-325 MG PO TABS   Oral   Take 1 tablet by mouth every 6 (six) hours as needed. For pain   30 tablet   0   . IBUPROFEN 200 MG PO TABS   Oral   Take 200 mg by mouth every 6 (six) hours as needed. For pain         . ISOSORBIDE DINITRATE 20 MG PO TABS   Oral   Take 20 mg by mouth 2 (two) times daily.          [name] LIDOCAINE 5 % EX PTCH   Transdermal   Place 1 patch onto the skin daily. Remove & Discard patch within 12 hours or as directed by MD         . LISINOPRIL 40 MG PO TABS   Oral   Take 40 mg by mouth 2 (two) times daily.         [name] LISINOPRIL-HYDROCHLOROTHIAZIDE 20-12.5 MG PO TABS   Oral   Take 1 tablet by mouth daily.         [name] LORATADINE 10 MG PO TABS   Oral   Take 10 mg by mouth daily.         [name] METFORMIN HCL 1000 MG PO TABS   Oral   Take 1,000 mg by mouth 2 (two) times daily with a meal.         . MULTI-VITAMIN/MINERALS PO TABS   Oral   Take 1 tablet by mouth daily.           [name] NITROGLYCERIN 0.4 MG SL SUBL   Sublingual   Place 0.4 mg under the tongue every 5 (five) minutes as needed. For chest pain.         [name] OMEPRAZOLE 20 MG PO CPDR   Oral   Take 20 mg by mouth daily.           [name] POTASSIUM CHLORIDE CRYS ER 10 MEQ PO TBCR   Oral   Take 2 tablets (20 mEq total) by mouth 2 (two) times daily.   30 tablet   0   . PROBIOTIC DAILY PO  Oral   Take 1 capsule by mouth daily.         . SERTRALINE HCL 100 MG PO TABS   Oral   Take 100 mg by mouth daily.            BP 115/69  Pulse 76  Temp 97.8 F (36.6 C) (Oral)  Resp 22  SpO2 98%  Physical Exam  Nursing note and vitals reviewed. Constitutional: She appears well-developed and well-nourished.  HENT:  Head: Normocephalic  and atraumatic.  Right Ear: Tympanic membrane, external ear and ear canal normal.  Left Ear: Tympanic membrane, external ear and ear canal normal.  Nose: Mucosal edema and rhinorrhea present.  Mouth/Throat: Uvula is midline, oropharynx is clear and moist and mucous membranes are normal.  Eyes: Conjunctivae normal are normal. Pupils are equal, round, and reactive to light. Right eye exhibits no discharge. Left eye exhibits no discharge.  Neck: Normal range of motion. Neck supple. No JVD present.  Cardiovascular: Normal rate, regular rhythm, normal heart sounds and intact distal pulses.   No murmur heard. Pulmonary/Chest: Effort normal and breath sounds normal. No respiratory distress. She has no wheezes. She has no rales. She exhibits no tenderness.  Abdominal: Soft. Bowel sounds are normal. She exhibits no distension. There is no tenderness. There is no rebound and no guarding.  Musculoskeletal: She exhibits no edema and no tenderness.       No calf tenderness, no lower extremity edema or erythema, no palpable cords, 2+ dorsalis pedis and posterior tibialis pulses bilaterally.  Neurological: She is alert.  Skin: Skin is warm and dry.  Psychiatric: She has a normal mood and affect.    ED Course  Procedures (including critical care time)  Labs Reviewed  CBC WITH DIFFERENTIAL - Abnormal; Notable for the following:    Hemoglobin 11.7 (*)     Neutrophils Relative 33 (*)     Lymphocytes Relative 51 (*)     All other components within normal limits  BASIC METABOLIC PANEL - Abnormal; Notable for the following:    Glucose, Bld 135 (*)     GFR calc non Af Amer 75 (*)     GFR calc Af Amer 87 (*)     All other components within normal limits   Dg Chest 2 View  04/27/2012  *RADIOLOGY REPORT*  Clinical Data: Shortness of breath and cough  CHEST - 2 VIEW  Comparison: 04/19/2012  Findings: Heart size upper limits of normal with central vascular congestion but no overt edema.  No focal pulmonary  opacity.  No pleural effusion.  No acute osseous abnormality.  IMPRESSION: Borderline cardiomegaly with central vascular congestion but no focal acute finding.   Original Report Authenticated By: Christiana Pellant, M.D.    Ct Angio Chest Pe W/cm &/or Wo Cm  04/27/2012  *RADIOLOGY REPORT*  Clinical Data: Short of breath.  Right-sided chest pain.  CT ANGIOGRAPHY CHEST  Technique:  Multidetector CT imaging of the chest using the standard protocol during bolus administration of intravenous contrast. Multiplanar reconstructed images including MIPs were obtained and reviewed to evaluate the vascular anatomy.  Contrast: OMNIPAQUE IOHEXOL 350 MG/ML SOLN  Comparison: 01/08/2012  Findings: Satisfactory opacification of the pulmonary arteries noted, and there is no evidence of pulmonary emboli.  No evidence of thoracic aortic aneurysm or dissection.  No evidence of pleural or pericardial effusion.  Ill-defined airspace disease is seen in the left lower lobe which is new since previous study, and suspicious for pneumonia.  No suspicious  pulmonary nodules or masses are identified.  There is no evidence of central endobronchial obstruction.  No hilar masses or lymphadenopathy identified.  A bilobed anterior mediastinal mass or lymphadenopathy remains stable compared to previous exams dating back to 01/07/2007, consistent with benign etiology.  IMPRESSION:  1.  No evidence of pulmonary embolism. 2.  Mild left lower lobe airspace disease, suspicious for pneumonia. 3.  Stable anterior mediastinal mass or lymphadenopathy dating back to 2008, consistent with benign etiology.   Original Report Authenticated By: Myles Rosenthal, M.D.      1. CAP (community acquired pneumonia)   2. Cough     9:27 AM Patient seen and examined. Work-up initiated. Medications ordered.   Vital signs reviewed and are as follows: Filed Vitals:   04/27/12 0918  BP: 115/69  Pulse: 76  Temp: 97.8 F (36.6 C)  Resp: 22   Discussed extensively  with Dr. Rhunette Croft. Patient does not clinically appear to have a PE. She appears clinically to have a viral illness. There are no findings of DVT. She had elevated d-dimer and now has hemoptysis that seems to be due to frequent coughing. Would like to avoid additional radiation dose given extensive h/o CT scans.   Will repeat labs. Will repeat CXR. Will ambulate with pulse ox. Vitals here are normal, patient is not tachycardic or hypoxic. Again, clinically she appears as a viral illness and not PE. Will hold off on CT unless patient desats with ambulation.    Date: 04/27/2012  Rate: 68  Rhythm: normal sinus rhythm  QRS Axis: normal  Intervals: normal  ST/T Wave abnormalities: nonspecific T wave changes  Conduction Disutrbances:none  Narrative Interpretation: no tachycardia, no evidence of right heart strain  Old EKG Reviewed: unchanged from 04/19/2012  11:35 AM Patient ambulated with sat from 97% to 93%. No focal findings on CXR to explain R sided chest pain. She states she felt some shortness of breath with walking. Patient states she had similar symptoms with previous blood clots as far as pick sputum and onset of cough. I discussed with the patient the risks and benefits of both CT angio and avoidance of further radiation. Patient states that she would rather be sure that she does not have a blood clot. CT angio ordered.   CT angio was neg. CT shows possible PNA. Patient has coughing spell but is improved after albuterol.   I spoke with Dr. Evlyn Kanner, on call for Dr. Link Snuffer. Reccs: PCP f/u in 2 days. Start levaquin. Give steroids.   Patient informed and she is in agreement.  Patient urged to return with worsening shortness of breath, worsening chest pain or work of breathing. Urged return if persistent vomiting or fever. Urged return if she has any other concerns. Patient verbalizes understanding and agrees with the plan.    MDM  Patient ruled out for PE. She likely has left lower lobe  pneumonia. I touch based with her primary care physician who will follow up with her closely in 2 days. Additional antibiotics started as well as steroids for shortness of breath, wheezing her PCP reccs. Patient has albuterol at home. She was provided with a spacer as well. She appears well at time of discharge, no dyspnea or respiratory distress.   Do not feel this is a congestive heart failure exacerbation given CT findings and clinical exam.        Renne Crigler, PA 04/27/12 1533

## 2012-04-27 NOTE — ED Notes (Signed)
Pt taken to xray 

## 2012-04-28 NOTE — ED Provider Notes (Signed)
Medical screening examination/treatment/procedure(s) were conducted as a shared visit with non-physician practitioner(s) and myself.  I personally evaluated the patient during the encounter  Derwood Kaplan, MD 04/28/12 1205

## 2012-04-30 ENCOUNTER — Encounter (HOSPITAL_BASED_OUTPATIENT_CLINIC_OR_DEPARTMENT_OTHER): Payer: Medicare Other

## 2012-05-28 ENCOUNTER — Encounter (HOSPITAL_BASED_OUTPATIENT_CLINIC_OR_DEPARTMENT_OTHER): Payer: Medicare Other

## 2012-05-29 ENCOUNTER — Other Ambulatory Visit (HOSPITAL_COMMUNITY): Payer: Self-pay | Admitting: Internal Medicine

## 2012-05-29 DIAGNOSIS — Z1231 Encounter for screening mammogram for malignant neoplasm of breast: Secondary | ICD-10-CM

## 2012-06-03 ENCOUNTER — Ambulatory Visit (HOSPITAL_COMMUNITY)
Admission: RE | Admit: 2012-06-03 | Discharge: 2012-06-03 | Disposition: A | Discharge status: Home / Self Care | Payer: Medicare Other | Source: Ambulatory Visit | Attending: Internal Medicine | Admitting: Internal Medicine

## 2012-06-03 ENCOUNTER — Other Ambulatory Visit (HOSPITAL_COMMUNITY): Payer: Self-pay | Admitting: Internal Medicine

## 2012-06-03 ENCOUNTER — Emergency Department (HOSPITAL_COMMUNITY): Payer: Medicare Other | Admitting: Anesthesiology

## 2012-06-03 ENCOUNTER — Other Ambulatory Visit: Payer: Self-pay

## 2012-06-03 ENCOUNTER — Emergency Department (HOSPITAL_COMMUNITY): Payer: Medicare Other

## 2012-06-03 ENCOUNTER — Inpatient Hospital Stay (HOSPITAL_COMMUNITY)
Admission: EM | Admit: 2012-06-03 | Discharge: 2012-06-05 | DRG: 339 | Disposition: A | Discharge status: Home / Self Care | Payer: Medicare Other | Attending: Surgery | Admitting: Surgery

## 2012-06-03 ENCOUNTER — Encounter (HOSPITAL_COMMUNITY): Payer: Self-pay | Admitting: Anesthesiology

## 2012-06-03 ENCOUNTER — Encounter (HOSPITAL_COMMUNITY): Payer: Self-pay | Admitting: *Deleted

## 2012-06-03 ENCOUNTER — Encounter (HOSPITAL_COMMUNITY): Admission: EM | Disposition: A | Discharge status: Home / Self Care | Payer: Self-pay | Source: Home / Self Care

## 2012-06-03 DIAGNOSIS — I5032 Chronic diastolic (congestive) heart failure: Secondary | ICD-10-CM | POA: Diagnosis present

## 2012-06-03 DIAGNOSIS — F3289 Other specified depressive episodes: Secondary | ICD-10-CM | POA: Diagnosis present

## 2012-06-03 DIAGNOSIS — F411 Generalized anxiety disorder: Secondary | ICD-10-CM | POA: Diagnosis present

## 2012-06-03 DIAGNOSIS — R630 Anorexia: Secondary | ICD-10-CM | POA: Diagnosis present

## 2012-06-03 DIAGNOSIS — K3580 Unspecified acute appendicitis: Secondary | ICD-10-CM

## 2012-06-03 DIAGNOSIS — K35209 Acute appendicitis with generalized peritonitis, without abscess, unspecified as to perforation: Principal | ICD-10-CM | POA: Diagnosis present

## 2012-06-03 DIAGNOSIS — R197 Diarrhea, unspecified: Secondary | ICD-10-CM

## 2012-06-03 DIAGNOSIS — J45909 Unspecified asthma, uncomplicated: Secondary | ICD-10-CM | POA: Diagnosis present

## 2012-06-03 DIAGNOSIS — Z87891 Personal history of nicotine dependence: Secondary | ICD-10-CM

## 2012-06-03 DIAGNOSIS — I509 Heart failure, unspecified: Secondary | ICD-10-CM | POA: Diagnosis present

## 2012-06-03 DIAGNOSIS — E669 Obesity, unspecified: Secondary | ICD-10-CM | POA: Diagnosis present

## 2012-06-03 DIAGNOSIS — Z86711 Personal history of pulmonary embolism: Secondary | ICD-10-CM

## 2012-06-03 DIAGNOSIS — K352 Acute appendicitis with generalized peritonitis, without abscess: Principal | ICD-10-CM | POA: Diagnosis present

## 2012-06-03 DIAGNOSIS — I10 Essential (primary) hypertension: Secondary | ICD-10-CM | POA: Diagnosis present

## 2012-06-03 DIAGNOSIS — F329 Major depressive disorder, single episode, unspecified: Secondary | ICD-10-CM | POA: Diagnosis present

## 2012-06-03 HISTORY — PX: LAPAROSCOPIC APPENDECTOMY: SHX408

## 2012-06-03 LAB — TYPE AND SCREEN
ABO/RH(D): A POS
Antibody Screen: NEGATIVE

## 2012-06-03 LAB — CBC WITH DIFFERENTIAL/PLATELET
Basophils Absolute: 0 10*3/uL (ref 0.0–0.1)
Basophils Relative: 0 % (ref 0–1)
Eosinophils Absolute: 0.2 10*3/uL (ref 0.0–0.7)
Eosinophils Relative: 2 % (ref 0–5)
HCT: 38.4 % (ref 36.0–46.0)
Hemoglobin: 12.8 g/dL (ref 12.0–15.0)
Lymphocytes Relative: 39 % (ref 12–46)
Lymphs Abs: 3.4 10*3/uL (ref 0.7–4.0)
MCH: 29 pg (ref 26.0–34.0)
MCHC: 33.3 g/dL (ref 30.0–36.0)
MCV: 87.1 fL (ref 78.0–100.0)
Monocytes Absolute: 0.8 10*3/uL (ref 0.1–1.0)
Monocytes Relative: 9 % (ref 3–12)
Neutro Abs: 4.3 10*3/uL (ref 1.7–7.7)
Neutrophils Relative %: 49 % (ref 43–77)
Platelets: 277 10*3/uL (ref 150–400)
RBC: 4.41 MIL/uL (ref 3.87–5.11)
RDW: 13.8 % (ref 11.5–15.5)
WBC: 8.7 10*3/uL (ref 4.0–10.5)

## 2012-06-03 LAB — BASIC METABOLIC PANEL
BUN: 8 mg/dL (ref 6–23)
CO2: 28 mEq/L (ref 19–32)
Calcium: 9.7 mg/dL (ref 8.4–10.5)
Chloride: 99 mEq/L (ref 96–112)
Creatinine, Ser: 0.76 mg/dL (ref 0.50–1.10)
GFR calc Af Amer: >90 mL/min (ref >90)
GFR calc non Af Amer: >90 mL/min (ref >90)
Glucose, Bld: 98 mg/dL (ref 70–99)
Potassium: 3.9 mEq/L (ref 3.5–5.1)
Sodium: 138 mEq/L (ref 135–145)

## 2012-06-03 LAB — APTT: aPTT: 32 seconds (ref 24–37)

## 2012-06-03 LAB — PROTIME-INR
INR: 1 (ref 0.00–1.49)
Prothrombin Time: 13.1 seconds (ref 11.6–15.2)

## 2012-06-03 LAB — ABO/RH: ABO/RH(D): A POS

## 2012-06-03 SURGERY — APPENDECTOMY, LAPAROSCOPIC
Anesthesia: General | Site: Abdomen | Wound class: Clean Contaminated

## 2012-06-03 MED ORDER — LACTATED RINGERS IV SOLN
INTRAVENOUS | Status: DC | PRN
  Administered 2012-06-03 – 2012-06-04 (×2): via INTRAVENOUS

## 2012-06-03 MED ORDER — MIDAZOLAM HCL 5 MG/5ML IJ SOLN
INTRAMUSCULAR | Status: DC | PRN
  Administered 2012-06-03: 2 mg via INTRAVENOUS

## 2012-06-03 MED ORDER — PHENYLEPHRINE HCL 10 MG/ML IJ SOLN
INTRAMUSCULAR | Status: DC | PRN
  Administered 2012-06-03: 40 ug via INTRAVENOUS
  Administered 2012-06-04: 80 ug via INTRAVENOUS
  Administered 2012-06-04 (×2): 120 ug via INTRAVENOUS

## 2012-06-03 MED ORDER — SODIUM CHLORIDE 0.9 % IV SOLN
1.0000 g | INTRAVENOUS | Status: DC
  Administered 2012-06-03 – 2012-06-04 (×2): 1 g via INTRAVENOUS
  Filled 2012-06-03 (×3): qty 1

## 2012-06-03 MED ORDER — IOHEXOL 300 MG/ML  SOLN
100.0000 mL | Freq: Once | INTRAMUSCULAR | Status: AC | PRN
  Administered 2012-06-03: 100 mL via INTRAVENOUS

## 2012-06-03 MED ORDER — MORPHINE SULFATE 4 MG/ML IJ SOLN
4.0000 mg | Freq: Once | INTRAMUSCULAR | Status: AC
  Administered 2012-06-03: 4 mg via INTRAVENOUS
  Filled 2012-06-03: qty 1

## 2012-06-03 MED ORDER — FENTANYL CITRATE 0.05 MG/ML IJ SOLN
INTRAMUSCULAR | Status: DC | PRN
  Administered 2012-06-03: 100 ug via INTRAVENOUS

## 2012-06-03 MED ORDER — ROCURONIUM BROMIDE 100 MG/10ML IV SOLN
INTRAVENOUS | Status: DC | PRN
  Administered 2012-06-03: 30 mg via INTRAVENOUS
  Administered 2012-06-04: 10 mg via INTRAVENOUS

## 2012-06-03 SURGICAL SUPPLY — 43 items
APPLIER CLIP ROT 10 11.4 M/L (STAPLE) ×2
BLADE SURG ROTATE 9660 (MISCELLANEOUS) ×2 IMPLANT
CANISTER SUCTION 2500CC (MISCELLANEOUS) ×2 IMPLANT
CHLORAPREP W/TINT 26ML (MISCELLANEOUS) ×2 IMPLANT
CLIP APPLIE ROT 10 11.4 M/L (STAPLE) ×1 IMPLANT
CLOTH BEACON ORANGE TIMEOUT ST (SAFETY) ×2 IMPLANT
COVER SURGICAL LIGHT HANDLE (MISCELLANEOUS) ×2 IMPLANT
CUTTER FLEX LINEAR 45M (STAPLE) IMPLANT
DERMABOND ADVANCED (GAUZE/BANDAGES/DRESSINGS) ×1
DERMABOND ADVANCED .7 DNX12 (GAUZE/BANDAGES/DRESSINGS) ×1 IMPLANT
DRAPE UTILITY 15X26 W/TAPE STR (DRAPE) ×4 IMPLANT
DRAPE WARM FLUID 44X44 (DRAPE) ×2 IMPLANT
ELECT REM PT RETURN 9FT ADLT (ELECTROSURGICAL) ×2
ELECTRODE REM PT RTRN 9FT ADLT (ELECTROSURGICAL) ×1 IMPLANT
ENDOLOOP SUT PDS II  0 18 (SUTURE)
ENDOLOOP SUT PDS II 0 18 (SUTURE) IMPLANT
GLOVE BIO SURGEON STRL SZ 6 (GLOVE) ×2 IMPLANT
GLOVE BIO SURGEON STRL SZ8 (GLOVE) ×4 IMPLANT
GLOVE BIOGEL M STRL SZ7.5 (GLOVE) ×4 IMPLANT
GLOVE BIOGEL PI IND STRL 6.5 (GLOVE) ×1 IMPLANT
GLOVE BIOGEL PI IND STRL 8 (GLOVE) ×2 IMPLANT
GLOVE BIOGEL PI INDICATOR 6.5 (GLOVE) ×1
GLOVE BIOGEL PI INDICATOR 8 (GLOVE) ×2
GOWN PREVENTION PLUS XXLARGE (GOWN DISPOSABLE) ×6 IMPLANT
GOWN STRL NON-REIN LRG LVL3 (GOWN DISPOSABLE) IMPLANT
KIT BASIN OR (CUSTOM PROCEDURE TRAY) ×2 IMPLANT
KIT ROOM TURNOVER OR (KITS) ×2 IMPLANT
NS IRRIG 1000ML POUR BTL (IV SOLUTION) ×2 IMPLANT
PAD ARMBOARD 7.5X6 YLW CONV (MISCELLANEOUS) ×4 IMPLANT
POUCH SPECIMEN RETRIEVAL 10MM (ENDOMECHANICALS) ×2 IMPLANT
RELOAD STAPLE TA45 3.5 REG BLU (ENDOMECHANICALS) ×2 IMPLANT
SCALPEL HARMONIC ACE (MISCELLANEOUS) ×2 IMPLANT
SET IRRIG TUBING LAPAROSCOPIC (IRRIGATION / IRRIGATOR) ×2 IMPLANT
SLEEVE ENDOPATH XCEL 5M (ENDOMECHANICALS) ×2 IMPLANT
SPECIMEN JAR SMALL (MISCELLANEOUS) ×2 IMPLANT
SUT MNCRL AB 4-0 PS2 18 (SUTURE) ×2 IMPLANT
TOWEL OR 17X24 6PK STRL BLUE (TOWEL DISPOSABLE) ×2 IMPLANT
TOWEL OR 17X26 10 PK STRL BLUE (TOWEL DISPOSABLE) ×2 IMPLANT
TRAY FOLEY CATH 14FR (SET/KITS/TRAYS/PACK) ×2 IMPLANT
TRAY LAPAROSCOPIC (CUSTOM PROCEDURE TRAY) ×2 IMPLANT
TROCAR XCEL 12X100 BLDLESS (ENDOMECHANICALS) ×2 IMPLANT
TROCAR XCEL NON-BLD 5MMX100MML (ENDOMECHANICALS) ×2 IMPLANT
WATER STERILE IRR 1000ML POUR (IV SOLUTION) IMPLANT

## 2012-06-03 NOTE — Anesthesia Preprocedure Evaluation (Addendum)
Anesthesia Evaluation  Patient identified by MRN, date of birth, ID band Patient awake    Reviewed: Allergy & Precautions, H&P , NPO status , Patient's Chart, lab work & pertinent test results, reviewed documented beta blocker date and time   Airway Mallampati: II  Neck ROM: Full    Dental  (+) Teeth Intact and Dental Advisory Given   Pulmonary shortness of breath, asthma , sleep apnea ,          Cardiovascular hypertension, Pt. on medications and Pt. on home beta blockers + angina + DOE     Neuro/Psych PSYCHIATRIC DISORDERS Anxiety Depression    GI/Hepatic   Endo/Other  diabetes, Oral Hypoglycemic AgentsMorbid obesity  Renal/GU      Musculoskeletal   Abdominal (+) + obese,   Peds  Hematology   Anesthesia Other Findings   Reproductive/Obstetrics                         Anesthesia Physical Anesthesia Plan  ASA: III and emergent  Anesthesia Plan: General   Post-op Pain Management:    Induction: Intravenous, Rapid sequence and Cricoid pressure planned  Airway Management Planned: Oral ETT  Additional Equipment:   Intra-op Plan:   Post-operative Plan: Extubation in OR  Informed Consent: I have reviewed the patients History and Physical, chart, labs and discussed the procedure including the risks, benefits and alternatives for the proposed anesthesia with the patient or authorized representative who has indicated his/her understanding and acceptance.   Dental advisory given  Plan Discussed with: CRNA and Surgeon  Anesthesia Plan Comments: (See surgeon's H&P)       Anesthesia Quick Evaluation

## 2012-06-03 NOTE — ED Notes (Signed)
Pt coming from CT for outpatient abdominal scan. Radiologist found acute appendicitis. Pt transferred to ED for further evaluation.

## 2012-06-03 NOTE — H&P (Signed)
[name] is an 52 y.o. female.   Chief Complaint: Appendicitis HPI:  Patient is a 52 year old female who presents with around 1-1/2 days of crampy diffuse abdominal pain, anorexia, and diarrhea. Last night this began to localize to the right side of the abdomen. Over the course of the last 24 hours, the pain has gotten more and more severe. She has not eaten a full meal since Saturday. She complains of nausea but no vomiting. She denies fevers and chills.  She describes the abdominal cramping pain as similar to when she had a baby. She has had 2 laparoscopic surgeries for "exploration". She has also had a hysterectomy. She describes the pain as 9/10. She cannot get comfortable. She did not try anything at home to try to make it better. The pain medication that she got in the ED has helped somewhat.  Past Medical History  Diagnosis Date  . Hypertension   . Diabetes mellitus   . Asthma   . Anginal pain     a. NL cath in 2008;  b. Myoview 03/2011: dec uptake along mid anterior wall on stress imaging -> ? attenuation vs. ischemia, EF 65%;  c. Echo 04/2011: EF 55-60%, no RWMA, Gr 2 dd  . Depression   . Anxiety   . Pulmonary embolism     a. 2008 -> coumadin x 6 mos.  . Sleep apnea   . 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 Obesity   . CHF (congestive heart failure)     Past Surgical History  Procedure Laterality Date  . Abdominal hysterectomy  2005  . Leg surgery    . Cardiac catheterization      Normal  . Tubal ligation  1989  . Left knee surgery  2008    Family History  Problem Relation Age of Onset  . Emphysema Mother   . Asthma Brother   . Heart disease Paternal Grandfather   . Heart disease Father     died @ 72's.  . Arthritis Mother   . Colon cancer Maternal Grandfather   . Heart failure Mother     alive @ 32   Social History:  reports that she has quit smoking. Her smoking use included Cigarettes. She has a 7.5 pack-year smoking history. She  does not have any smokeless tobacco history on file. She reports that she does not drink alcohol or use illicit drugs.  Allergies:  Allergies  Allergen Reactions  . Penicillins Swelling  . Sulfa Antibiotics Rash     (Not in a hospital admission)  Results for orders placed during the hospital encounter of 06/03/12 (from the past 48 hour(s))  TYPE AND SCREEN     Status: None   Collection Time    06/03/12  9:45 PM      Result Value Range   ABO/RH(D) A POS     Antibody Screen NEG     Sample Expiration 06/06/2012    ABO/RH     Status: None   Collection Time    06/03/12  9:45 PM      Result Value Range   ABO/RH(D) A POS    CBC WITH DIFFERENTIAL     Status: None   Collection Time    06/03/12  9:46 PM      Result Value Range   WBC 8.7  4.0 - 10.5 K/uL   RBC 4.41  3.87 - 5.11 MIL/uL   Hemoglobin 12.8  12.0 -  15.0 g/dL   HCT 16.1  09.6 - 04.5 %   MCV 87.1  78.0 - 100.0 fL   MCH 29.0  26.0 - 34.0 pg   MCHC 33.3  30.0 - 36.0 g/dL   RDW 40.9  81.1 - 91.4 %   Platelets 277  150 - 400 K/uL   Neutrophils Relative 49  43 - 77 %   Neutro Abs 4.3  1.7 - 7.7 K/uL   Lymphocytes Relative 39  12 - 46 %   Lymphs Abs 3.4  0.7 - 4.0 K/uL   Monocytes Relative 9  3 - 12 %   Monocytes Absolute 0.8  0.1 - 1.0 K/uL   Eosinophils Relative 2  0 - 5 %   Eosinophils Absolute 0.2  0.0 - 0.7 K/uL   Basophils Relative 0  0 - 1 %   Basophils Absolute 0.0  0.0 - 0.1 K/uL  BASIC METABOLIC PANEL     Status: None   Collection Time    06/03/12  9:46 PM      Result Value Range   Sodium 138  135 - 145 mEq/L   Potassium 3.9  3.5 - 5.1 mEq/L   Chloride 99  96 - 112 mEq/L   CO2 28  19 - 32 mEq/L   Glucose, Bld 98  70 - 99 mg/dL   BUN 8  6 - 23 mg/dL   Creatinine, Ser 7.82  0.50 - 1.10 mg/dL   Calcium 9.7  8.4 - 95.6 mg/dL   GFR calc non Af Amer >90  >90 mL/min   GFR calc Af Amer >90  >90 mL/min   Comment:            The eGFR has been calculated     using the CKD EPI equation.     This calculation has  not been     validated in all clinical     situations.     eGFR's persistently     <90 mL/min signify     possible Chronic Kidney Disease.  APTT     Status: None   Collection Time    06/03/12  9:46 PM      Result Value Range   aPTT 32  24 - 37 seconds  PROTIME-INR     Status: None   Collection Time    06/03/12  9:46 PM      Result Value Range   Prothrombin Time 13.1  11.6 - 15.2 seconds   INR 1.00  0.00 - 1.49   Ct Abdomen Pelvis W Contrast  06/03/2012  **ADDENDUM** CREATED: 06/03/2012 21:25:57  The original report was by Dr. Dr. Loraine Leriche showed grade.  The following addendum is by Dr. Gaylyn Rong:  Emergency findings were called to Dr. Lorain Childes by the CT technologist at approximately 9:00 p.m. on 06/03/2012, and the patient was sent to the emergency department.  **END ADDENDUM** SIGNED BY: Soyla Murphy. Ova Freshwater, M.D.   06/03/2012  *RADIOLOGY REPORT*  Clinical Data: Abdominal pain.  Right-sided cramping.  CT ABDOMEN AND PELVIS WITH CONTRAST  Technique:  Multidetector CT imaging of the abdomen and pelvis was performed following the standard protocol during bolus administration of intravenous contrast.  Contrast: OMNIPAQUE IOHEXOL 300 MG/ML  SOLN  Comparison: 09/20/2008  Findings: Lung bases are clear.  No pleural or pericardial fluid. The liver shows fatty change without focal lesions or biliary ductal dilatation.  No calcified gallstones.  The spleen is normal. The pancreas is normal.  The adrenal glands  are normal.  The kidneys are normal.  No cyst, mass, stone or hydronephrosis.  The aorta and IVC are normal.  There is acute appendicitis with a thickened appendix and periappendiceal stranding but no rupture or abscess.  The appendix is largely retrocecal.  There has been previous hysterectomy.  No pelvic mass.  Ordinary degenerative changes effect the lower lumbar spine.  IMPRESSION: Acute appendicitis without rupture or abscess.   Original Report Authenticated By: Paulina Fusi, M.D.     Dg Chest Port 1 View  06/03/2012  *RADIOLOGY REPORT*  Clinical Data: Chest and abdominal pain.  PORTABLE CHEST - 1 VIEW  Comparison: 04/27/2012  Findings: Cardiac and mediastinal contours appear normal.  The lungs appear clear.  No pleural effusion is identified.  Thoracic spondylosis noted.  IMPRESSION: 1.  No acute thoracic findings are apparent.  2.  Thoracic spondylosis.   Original Report Authenticated By: Gaylyn Rong, M.D.     Review of Systems  Respiratory:       Recent pneumonia  Gastrointestinal: Positive for abdominal pain and diarrhea.  All other systems reviewed and are negative.    Blood pressure 116/60, pulse 81, resp. rate 18, SpO2 95.00%. Physical Exam  Constitutional: She is oriented to person, place, and time. She appears well-developed and well-nourished. No distress.  HENT:  Head: Normocephalic and atraumatic.  Mouth/Throat: No oropharyngeal exudate.  Eyes: Conjunctivae are normal. Pupils are equal, round, and reactive to light. Right eye exhibits no discharge. Left eye exhibits no discharge. No scleral icterus.  Neck: Normal range of motion. Neck supple. No tracheal deviation present. No thyromegaly present.  Cardiovascular: Normal rate, regular rhythm, normal heart sounds and intact distal pulses.  Exam reveals no gallop and no friction rub.   No murmur heard. Respiratory: Effort normal. No respiratory distress. She has no wheezes. She has no rales. She exhibits no tenderness.  GI: Soft. Bowel sounds are normal. She exhibits no distension and no mass. There is tenderness (RLQ and right central abdominal). There is guarding (voluntary guarding). There is no rebound.  Lymphadenopathy:    She has no cervical adenopathy.  Neurological: She is alert and oriented to person, place, and time. Coordination normal.  Skin: Skin is warm and dry. No rash noted. She is not diaphoretic. No erythema. No pallor.  Psychiatric: She has a normal mood and affect. Her behavior is  normal. Judgment and thought content normal.     Assessment/Plan Acute appendicitis Plan laparoscopic appendectomy Discussed surgery with pt as well as risks.    Appendectomy was described to the patient.  The incisions and surgical technique were explained.  The patient was advised that some of the hair on the abdomen would be clipped, and that a foley catheter would be placed.  I advised the patient of the risks of surgery including, but not limited to, bleeding, infection, damage to other structures, risk of an open operation, risk of abscess, and risk of blood clot.  The recovery was also described to the patient.  She was advised that she will have lifting restrictions for 2 weeks.  The patient was advised that she may have adhesions that may preclude laparoscopic procedure. And she was informed that her risk of cardiac complications was higher than most due to her recent admission for heart failure.    She understands and wishes to proceed.      Jerilyn Gillaspie 06/03/2012, 10:42 PM

## 2012-06-03 NOTE — Preoperative (Signed)
Beta Blockers   Reason not to administer Beta Blockers:Not Applicable. Coreg 06/03/12 am

## 2012-06-03 NOTE — ED Notes (Signed)
Last time pt ate or drank was approximately 1230 pm 06/03/12

## 2012-06-03 NOTE — ED Provider Notes (Signed)
History     CSN: [id number]  Arrival date & time 06/03/12  2125   First MD Initiated Contact with Patient 06/03/12 2127      No chief complaint on file.   (Consider location/radiation/quality/duration/timing/severity/associated sxs/prior treatment) HPI Comments: 52 year old female with a history of diabetes, hypertension, congestive heart failure who presents with complaint of 24 hours of right lower quadrant pain. This was gradual in onset, gradually worsening, persistent and worse with palpation. She has associated nausea and diarrhea. She was seen earlier in the day by family doctor who ordered a CAT scan. The CAT scan has been read as acute appendicitis, the patient was sent to the emergency department for further evaluation and surgical consultation.  The history is provided by the patient.    Past Medical History  Diagnosis Date  . Hypertension   . Diabetes mellitus   . Asthma   . Anginal pain     a. NL cath in 2008;  b. Myoview 03/2011: dec uptake along mid anterior wall on stress imaging -> ? attenuation vs. ischemia, EF 65%;  c. Echo 04/2011: EF 55-60%, no RWMA, Gr 2 dd  . Depression   . Anxiety   . Pulmonary embolism     a. 2008 -> coumadin x 6 mos.  . Sleep apnea   . 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 Obesity   . CHF (congestive heart failure)     Past Surgical History  Procedure Laterality Date  . Abdominal hysterectomy  2005  . Leg surgery    . Cardiac catheterization      Normal  . Tubal ligation  1989  . Left knee surgery  2008    Family History  Problem Relation Age of Onset  . Emphysema Mother   . Asthma Brother   . Heart disease Paternal Grandfather   . Heart disease Father     died @ 58's.  . Arthritis Mother   . Colon cancer Maternal Grandfather   . Heart failure Mother     alive @ 57    History  Substance Use Topics  . Smoking status: Former Smoker -- 0.50 packs/day for 15 years    Types: Cigarettes  . Smokeless  tobacco: Not on file  . Alcohol Use: No    OB History   Grav Para Term Preterm Abortions TAB SAB Ect Mult Living                  Review of Systems  All other systems reviewed and are negative.    Allergies  Penicillins and Sulfa antibiotics  Home Medications   Current Outpatient Rx  Name  Route  Sig  Dispense  Refill  . albuterol (PROVENTIL,VENTOLIN) 90 MCG/ACT inhaler   Inhalation   Inhale 2 puffs into the lungs 3 (three) times daily as needed. For shortness of breath          . aspirin 81 MG tablet   Oral   Take 81 mg by mouth daily.           [name] atorvastatin (LIPITOR) 20 MG tablet   Oral   Take 20 mg by mouth at bedtime.          . carvedilol (COREG CR) 10 MG 24 hr capsule   Oral   Take 1 capsule (10 mg total) by mouth daily.   60 capsule   3   . diazepam (VALIUM) 5 MG tablet   Oral  Take 5 mg by mouth as needed. For sleep         . furosemide (LASIX) 40 MG tablet   Oral   Take 40 mg by mouth daily.         [name] HYDROcodone-acetaminophen (NORCO/VICODIN) 5-325 MG per tablet   Oral   Take 1 tablet by mouth every 6 (six) hours as needed. For pain   30 tablet   0   . ibuprofen (ADVIL,MOTRIN) 200 MG tablet   Oral   Take 200 mg by mouth every 6 (six) hours as needed. For pain         . isosorbide dinitrate (ISORDIL) 20 MG tablet   Oral   Take 20 mg by mouth 2 (two) times daily.          [name] levofloxacin (LEVAQUIN) 750 MG tablet   Oral   Take 1 tablet (750 mg total) by mouth daily.   4 tablet   0     Start taking 04/28/2012   . lidocaine (LIDODERM) 5 %   Transdermal   Place 1 patch onto the skin daily. Remove & Discard patch within 12 hours or as directed by MD         . lisinopril (PRINIVIL,ZESTRIL) 20 MG tablet   Oral   Take 20 mg by mouth 2 (two) times daily.         [name] loratadine (CLARITIN) 10 MG tablet   Oral   Take 10 mg by mouth daily.         . metFORMIN (GLUCOPHAGE) 1000 MG tablet   Oral   Take 1,000 mg by mouth 2  (two) times daily with a meal.         . Multiple Vitamins-Minerals (MULTIVITAMIN WITH MINERALS) tablet   Oral   Take 1 tablet by mouth daily.           . nitroGLYCERIN (NITROSTAT) 0.4 MG SL tablet   Sublingual   Place 0.4 mg under the tongue every 5 (five) minutes as needed. For chest pain.         [name] omeprazole (PRILOSEC) 20 MG capsule   Oral   Take 20 mg by mouth daily.           . potassium chloride (K-DUR,KLOR-CON) 10 MEQ tablet   Oral   Take 2 tablets (20 mEq total) by mouth 2 (two) times daily.   30 tablet   0   . predniSONE (DELTASONE) 20 MG tablet   Oral   Take 2 tablets (40 mg total) by mouth daily.   8 tablet   0   . sertraline (ZOLOFT) 100 MG tablet   Oral   Take 100 mg by mouth daily.            There were no vitals taken for this visit.  Physical Exam  Nursing note and vitals reviewed. Constitutional: She appears well-developed and well-nourished. No distress.  HENT:  Head: Normocephalic and atraumatic.  Mouth/Throat: Oropharynx is clear and moist. No oropharyngeal exudate.  Eyes: Conjunctivae and EOM are normal. Pupils are equal, round, and reactive to light. Right eye exhibits no discharge. Left eye exhibits no discharge. No scleral icterus.  Neck: Normal range of motion. Neck supple. No JVD present. No thyromegaly present.  Cardiovascular: Normal rate, regular rhythm, normal heart sounds and intact distal pulses.  Exam reveals no gallop and no friction rub.   No murmur heard. Pulmonary/Chest: Effort normal and breath sounds normal. No respiratory distress. She has no  wheezes. She has no rales.  Abdominal: Soft. Bowel sounds are normal. She exhibits no distension and no mass. There is no tenderness ( Focal tenderness to the right lower cautery and at McBurney's point, no Rovsing sign, no peritoneal signs, no other tenderness).  Musculoskeletal: Normal range of motion. She exhibits no edema and no tenderness.  Lymphadenopathy:    She has no  cervical adenopathy.  Neurological: She is alert. Coordination normal.  Skin: Skin is warm and dry. No rash noted. No erythema.  Psychiatric: She has a normal mood and affect. Her behavior is normal.    ED Course  Procedures (including critical care time)  Labs Reviewed  CBC WITH DIFFERENTIAL  BASIC METABOLIC PANEL  APTT  PROTIME-INR  TYPE AND SCREEN   Ct Abdomen Pelvis W Contrast  06/03/2012  **ADDENDUM** CREATED: 06/03/2012 21:25:57  The original report was by Dr. Dr. Loraine Leriche showed grade.  The following addendum is by Dr. Gaylyn Rong:  Emergency findings were called to Dr. Lorain Childes by the CT technologist at approximately 9:00 p.m. on 06/03/2012, and the patient was sent to the emergency department.  **END ADDENDUM** SIGNED BY: Soyla Murphy. Ova Freshwater, M.D.   06/03/2012  *RADIOLOGY REPORT*  Clinical Data: Abdominal pain.  Right-sided cramping.  CT ABDOMEN AND PELVIS WITH CONTRAST  Technique:  Multidetector CT imaging of the abdomen and pelvis was performed following the standard protocol during bolus administration of intravenous contrast.  Contrast: OMNIPAQUE IOHEXOL 300 MG/ML  SOLN  Comparison: 09/20/2008  Findings: Lung bases are clear.  No pleural or pericardial fluid. The liver shows fatty change without focal lesions or biliary ductal dilatation.  No calcified gallstones.  The spleen is normal. The pancreas is normal.  The adrenal glands are normal.  The kidneys are normal.  No cyst, mass, stone or hydronephrosis.  The aorta and IVC are normal.  There is acute appendicitis with a thickened appendix and periappendiceal stranding but no rupture or abscess.  The appendix is largely retrocecal.  There has been previous hysterectomy.  No pelvic mass.  Ordinary degenerative changes effect the lower lumbar spine.  IMPRESSION: Acute appendicitis without rupture or abscess.   Original Report Authenticated By: Paulina Fusi, M.D.      1. Acute appendicitis       MDM  The patient has an  exam consistent with acute appendicitis. I have reviewed the CT scan findings which showed acute appendicitis without rupture or abscess. She is n.p.o. for the last 9 hours, we will establish IV access, pain medication, preop labs and EKG and consultation with general surgery.  And discussed with Dr. Donell Beers, she will admit the patient for acute appendicitis.  ED ECG REPORT  I personally interpreted this EKG   Date: 06/03/2012   Rate: 82  Rhythm: normal sinus rhythm  QRS Axis: normal  Intervals: normal  ST/T Wave abnormalities: normal  Conduction Disutrbances:none  Narrative Interpretation:   Old EKG Reviewed: Compared with 04/27/2012, no significant change     Vida Roller, MD 06/03/12 2258

## 2012-06-04 ENCOUNTER — Inpatient Hospital Stay (HOSPITAL_COMMUNITY): Admission: RE | Admit: 2012-06-04 | Payer: Medicare Other | Source: Ambulatory Visit

## 2012-06-04 ENCOUNTER — Encounter (HOSPITAL_COMMUNITY): Payer: Self-pay | Admitting: *Deleted

## 2012-06-04 ENCOUNTER — Observation Stay (HOSPITAL_COMMUNITY): Payer: Medicare Other

## 2012-06-04 DIAGNOSIS — K352 Acute appendicitis with generalized peritonitis, without abscess: Secondary | ICD-10-CM | POA: Insufficient documentation

## 2012-06-04 DIAGNOSIS — Z9049 Acquired absence of other specified parts of digestive tract: Secondary | ICD-10-CM | POA: Insufficient documentation

## 2012-06-04 LAB — CBC
HCT: 35.8 % — ABNORMAL LOW (ref 36.0–46.0)
Hemoglobin: 11.7 g/dL — ABNORMAL LOW (ref 12.0–15.0)
MCH: 28.6 pg (ref 26.0–34.0)
MCHC: 32.7 g/dL (ref 30.0–36.0)
MCV: 87.5 fL (ref 78.0–100.0)
Platelets: 236 10*3/uL (ref 150–400)
RBC: 4.09 MIL/uL (ref 3.87–5.11)
RDW: 13.6 % (ref 11.5–15.5)
WBC: 9.5 10*3/uL (ref 4.0–10.5)

## 2012-06-04 LAB — BASIC METABOLIC PANEL
BUN: 9 mg/dL (ref 6–23)
CO2: 31 mEq/L (ref 19–32)
Calcium: 8.8 mg/dL (ref 8.4–10.5)
Chloride: 102 mEq/L (ref 96–112)
Creatinine, Ser: 0.85 mg/dL (ref 0.50–1.10)
GFR calc Af Amer: 90 mL/min — ABNORMAL LOW (ref >90)
GFR calc non Af Amer: 77 mL/min — ABNORMAL LOW (ref >90)
Glucose, Bld: 123 mg/dL — ABNORMAL HIGH (ref 70–99)
Potassium: 3.4 mEq/L — ABNORMAL LOW (ref 3.5–5.1)
Sodium: 141 mEq/L (ref 135–145)

## 2012-06-04 LAB — TROPONIN I
Troponin I: <0.3 ng/mL (ref <0.30)
Troponin I: <0.3 ng/mL (ref <0.30)
Troponin I: <0.3 ng/mL (ref <0.30)

## 2012-06-04 LAB — PRO B NATRIURETIC PEPTIDE: Pro B Natriuretic peptide (BNP): 40.7 pg/mL (ref 0–125)

## 2012-06-04 LAB — GLUCOSE, CAPILLARY
Glucose-Capillary: 108 mg/dL — ABNORMAL HIGH (ref 70–99)
Glucose-Capillary: 134 mg/dL — ABNORMAL HIGH (ref 70–99)

## 2012-06-04 LAB — MRSA PCR SCREENING: MRSA by PCR: NEGATIVE

## 2012-06-04 MED ORDER — KETOROLAC TROMETHAMINE 30 MG/ML IJ SOLN
INTRAMUSCULAR | Status: AC
  Administered 2012-06-04: 15 mg via INTRAVENOUS
  Filled 2012-06-04: qty 1

## 2012-06-04 MED ORDER — ACETAMINOPHEN 325 MG PO TABS: 650.0000 mg | ORAL_TABLET | ORAL | Status: DC | PRN

## 2012-06-04 MED ORDER — GLYCOPYRROLATE 0.2 MG/ML IJ SOLN
INTRAMUSCULAR | Status: DC | PRN
  Administered 2012-06-04: .8 mg via INTRAVENOUS

## 2012-06-04 MED ORDER — OXYCODONE HCL 5 MG/5ML PO SOLN: 5.0000 mg | Freq: Once | ORAL | Status: DC | PRN

## 2012-06-04 MED ORDER — ALBUTEROL 90 MCG/ACT IN AERS: 2.0000 | INHALATION_SPRAY | Freq: Three times a day (TID) | RESPIRATORY_TRACT | Status: DC | PRN

## 2012-06-04 MED ORDER — KETOROLAC TROMETHAMINE 15 MG/ML IJ SOLN
15.0000 mg | Freq: Four times a day (QID) | INTRAMUSCULAR | Status: AC
  Administered 2012-06-04: 15 mg via INTRAVENOUS
  Filled 2012-06-04: qty 1

## 2012-06-04 MED ORDER — NEOSTIGMINE METHYLSULFATE 1 MG/ML IJ SOLN
INTRAMUSCULAR | Status: DC | PRN
  Administered 2012-06-04: 5 mg via INTRAVENOUS

## 2012-06-04 MED ORDER — LACTATED RINGERS IV SOLN
INTRAVENOUS | Status: DC
  Administered 2012-06-04 – 2012-06-05 (×3): via INTRAVENOUS

## 2012-06-04 MED ORDER — POTASSIUM CHLORIDE CRYS ER 20 MEQ PO TBCR
20.0000 meq | EXTENDED_RELEASE_TABLET | Freq: Two times a day (BID) | ORAL | Status: DC
  Administered 2012-06-04 – 2012-06-05 (×2): 20 meq via ORAL
  Filled 2012-06-04 (×6): qty 1

## 2012-06-04 MED ORDER — HYDROCODONE-ACETAMINOPHEN 5-325 MG PO TABS
1.0000 | ORAL_TABLET | ORAL | Status: DC | PRN
  Administered 2012-06-04 – 2012-06-05 (×2): 1 via ORAL
  Filled 2012-06-04: qty 1
  Filled 2012-06-04: qty 2
  Filled 2012-06-04: qty 1

## 2012-06-04 MED ORDER — PROPOFOL 10 MG/ML IV BOLUS
INTRAVENOUS | Status: DC | PRN
  Administered 2012-06-03: 200 mg via INTRAVENOUS

## 2012-06-04 MED ORDER — ONDANSETRON HCL 4 MG/2ML IJ SOLN
INTRAMUSCULAR | Status: DC | PRN
  Administered 2012-06-04: 4 mg via INTRAVENOUS

## 2012-06-04 MED ORDER — LIDOCAINE 5 % EX PTCH
1.0000 | MEDICATED_PATCH | CUTANEOUS | Status: DC
  Filled 2012-06-04 (×3): qty 1

## 2012-06-04 MED ORDER — METOCLOPRAMIDE HCL 5 MG/ML IJ SOLN: 10.0000 mg | Freq: Once | INTRAMUSCULAR | Status: DC | PRN

## 2012-06-04 MED ORDER — LIDOCAINE HCL (CARDIAC) 20 MG/ML IV SOLN
INTRAVENOUS | Status: DC | PRN
  Administered 2012-06-03: 100 mg via INTRAVENOUS

## 2012-06-04 MED ORDER — SODIUM CHLORIDE 0.9 % IR SOLN
Status: DC | PRN
  Administered 2012-06-04: 1000 mL

## 2012-06-04 MED ORDER — DIPHENHYDRAMINE HCL 25 MG PO CAPS
25.0000 mg | ORAL_CAPSULE | Freq: Four times a day (QID) | ORAL | Status: DC | PRN
  Filled 2012-06-04: qty 1

## 2012-06-04 MED ORDER — ONDANSETRON HCL 4 MG/2ML IJ SOLN: 4.0000 mg | Freq: Four times a day (QID) | INTRAMUSCULAR | Status: DC | PRN

## 2012-06-04 MED ORDER — LISINOPRIL 20 MG PO TABS
20.0000 mg | ORAL_TABLET | Freq: Two times a day (BID) | ORAL | Status: DC
  Administered 2012-06-04 – 2012-06-05 (×2): 20 mg via ORAL
  Filled 2012-06-04 (×5): qty 1

## 2012-06-04 MED ORDER — SUCCINYLCHOLINE CHLORIDE 20 MG/ML IJ SOLN
INTRAMUSCULAR | Status: DC | PRN
  Administered 2012-06-03: 100 mg via INTRAVENOUS

## 2012-06-04 MED ORDER — NITROGLYCERIN 0.4 MG SL SUBL
0.4000 mg | SUBLINGUAL_TABLET | SUBLINGUAL | Status: DC | PRN
  Administered 2012-06-04: 0.4 mg via SUBLINGUAL
  Filled 2012-06-04: qty 25

## 2012-06-04 MED ORDER — OXYCODONE HCL 5 MG PO TABS: 5.0000 mg | ORAL_TABLET | Freq: Once | ORAL | Status: DC | PRN

## 2012-06-04 MED ORDER — ATORVASTATIN CALCIUM 40 MG PO TABS
40.0000 mg | ORAL_TABLET | Freq: Every day | ORAL | Status: DC
Start: 2012-06-04 — End: 2012-06-05
  Administered 2012-06-04: 40 mg via ORAL
  Filled 2012-06-04 (×3): qty 1

## 2012-06-04 MED ORDER — KETOROLAC TROMETHAMINE 15 MG/ML IJ SOLN
15.0000 mg | Freq: Four times a day (QID) | INTRAMUSCULAR | Status: DC | PRN
  Filled 2012-06-04: qty 1

## 2012-06-04 MED ORDER — ASPIRIN 325 MG PO TABS
325.0000 mg | ORAL_TABLET | Freq: Every day | ORAL | Status: DC
  Administered 2012-06-04 – 2012-06-05 (×2): 325 mg via ORAL
  Filled 2012-06-04 (×2): qty 1

## 2012-06-04 MED ORDER — FUROSEMIDE 40 MG PO TABS
40.0000 mg | ORAL_TABLET | Freq: Every day | ORAL | Status: DC
  Administered 2012-06-05: 40 mg via ORAL
  Filled 2012-06-04 (×2): qty 1

## 2012-06-04 MED ORDER — FUROSEMIDE 10 MG/ML IJ SOLN
40.0000 mg | Freq: Once | INTRAMUSCULAR | Status: AC
  Administered 2012-06-04: 40 mg via INTRAVENOUS
  Filled 2012-06-04: qty 4

## 2012-06-04 MED ORDER — ISOSORBIDE DINITRATE 20 MG PO TABS
20.0000 mg | ORAL_TABLET | Freq: Two times a day (BID) | ORAL | Status: DC
  Administered 2012-06-04 – 2012-06-05 (×2): 20 mg via ORAL
  Filled 2012-06-04 (×6): qty 1

## 2012-06-04 MED ORDER — SERTRALINE HCL 100 MG PO TABS
100.0000 mg | ORAL_TABLET | Freq: Every day | ORAL | Status: DC
  Administered 2012-06-05: 100 mg via ORAL
  Filled 2012-06-04 (×2): qty 1

## 2012-06-04 MED ORDER — ALBUTEROL SULFATE HFA 108 (90 BASE) MCG/ACT IN AERS: 2.0000 | INHALATION_SPRAY | Freq: Three times a day (TID) | RESPIRATORY_TRACT | Status: DC | PRN

## 2012-06-04 MED ORDER — LORATADINE 10 MG PO TABS
10.0000 mg | ORAL_TABLET | Freq: Every day | ORAL | Status: DC
  Administered 2012-06-05: 10 mg via ORAL
  Filled 2012-06-04 (×2): qty 1

## 2012-06-04 MED ORDER — HYDROMORPHONE HCL PF 1 MG/ML IJ SOLN
INTRAMUSCULAR | Status: AC
  Filled 2012-06-04: qty 1

## 2012-06-04 MED ORDER — LIDOCAINE HCL 1 % IJ SOLN
INTRAMUSCULAR | Status: DC | PRN
  Administered 2012-06-04: 01:00:00 via INTRAMUSCULAR

## 2012-06-04 MED ORDER — CARVEDILOL PHOSPHATE ER 10 MG PO CP24
10.0000 mg | ORAL_CAPSULE | Freq: Every day | ORAL | Status: DC
  Administered 2012-06-05: 10 mg via ORAL
  Filled 2012-06-04 (×2): qty 1

## 2012-06-04 MED ORDER — FLUTICASONE PROPIONATE HFA 44 MCG/ACT IN AERO
1.0000 | INHALATION_SPRAY | Freq: Two times a day (BID) | RESPIRATORY_TRACT | Status: DC
  Administered 2012-06-04 – 2012-06-05 (×3): 1 via RESPIRATORY_TRACT
  Filled 2012-06-04: qty 10.6

## 2012-06-04 MED ORDER — PANTOPRAZOLE SODIUM 40 MG PO TBEC
40.0000 mg | DELAYED_RELEASE_TABLET | Freq: Every day | ORAL | Status: DC
  Administered 2012-06-05: 40 mg via ORAL
  Filled 2012-06-04: qty 1

## 2012-06-04 MED ORDER — MORPHINE SULFATE 2 MG/ML IJ SOLN
1.0000 mg | INTRAMUSCULAR | Status: DC | PRN
  Administered 2012-06-04 (×3): 2 mg via INTRAVENOUS
  Filled 2012-06-04 (×3): qty 1

## 2012-06-04 MED ORDER — ONDANSETRON HCL 4 MG PO TABS: 4.0000 mg | ORAL_TABLET | Freq: Four times a day (QID) | ORAL | Status: DC | PRN

## 2012-06-04 MED ORDER — ENOXAPARIN SODIUM 30 MG/0.3ML ~~LOC~~ SOLN
40.0000 mg | SUBCUTANEOUS | Status: DC
  Administered 2012-06-04 – 2012-06-05 (×2): 40 mg via SUBCUTANEOUS
  Filled 2012-06-04 (×3): qty 0.4

## 2012-06-04 MED ORDER — HYDROMORPHONE HCL PF 1 MG/ML IJ SOLN
0.2500 mg | INTRAMUSCULAR | Status: DC | PRN
Start: 2012-06-04 — End: 2012-06-04
  Administered 2012-06-04 (×2): 0.25 mg via INTRAVENOUS
  Administered 2012-06-04: 0.5 mg via INTRAVENOUS

## 2012-06-04 NOTE — Progress Notes (Addendum)
Pt arrived to unit in bed with c-pap in use pt began to cough with blood clots. Vital signs in epic o2 stats would drop to upper 70's when oral care is being done to cleanse mouth RT was notified and MD on call was made aware. Will continue to monitor. Ilean Skill LPN   MD notified by RRT request that pt was on c-pap requiring oxygen at 10l O2 stats 94% New orders received

## 2012-06-04 NOTE — Progress Notes (Signed)
Spoke with Pt daughter Karel Jarvis that she is being transferred to rm # [id number]. Ilean Skill LPN

## 2012-06-04 NOTE — Progress Notes (Signed)
Pt. Coughing of small of blood-tinged mucous. MD made aware. Will continue to monitor for now.

## 2012-06-04 NOTE — Progress Notes (Signed)
Intermittent Progress Note:  S: I was called by RN at 09:42 to let me know that the patient was having chest pain. The patient also stated that she has pain like this at home she uses a steroid inhaler for relief and does not take nitroglycerin.   Upon my arrival to the room the patient has recently received nitroglycerin and fluticasone. She notes that her pain is a 10/10 located in the center of her chest. It is not associated with shortness of breath, nausea, vomiting or diaphoresis. When asked about her chest pain at home, she says it occurs frequently, but has improved over the last month since she started a steroid inhaler for "pneumonia." She denies a history of MI and has been to [facility] cardiology before. When asked if she get short of breath at home, she says yes with just walking around the house and was told that is is the result of her CHF. When asked about her history of blood clot in the lungs, she says it was due to "mold" in her house that caused her to "cough up blood and it turned into a blood clot."  O BP 108/49  Pulse 75  Temp(Src) 98.6 F (37 C) (Axillary)  Resp 17  Ht 5' (1.524 m)  Wt 233 lb 4 oz (105.8 kg)  BMI 45.55 kg/m2  SpO2 97% Gen: obese, middle aged BF, non distressed actually resting with her eyes closed when I enter the room CV: distant heart sounds due to obesity, RRR, no murmurs  Pulm: normal work of breathing, nasal cannula in place with sats in mid 90's, Superior lobes clear, lower lobes with decreased breath sounds and mild crackles Extremity: noteoworth muscle atrophy of calves and anterior tibialis, no edema or tendnerness of calves    <ECG>   Date: 06/04/2012  Rate: 72  Rhythm: normal sinus rhythm  QRS Axis: normal  Intervals: normal  ST/T Wave abnormalities: normal  Conduction Disutrbances:none  Narrative Interpretation: no evidence of ischemia   Old EKG Reviewed: unchanged from 06/03/12  CXR: atelectasis and mild pulm vasc congestion  A/P:  52 year old F with PMH of HTN, HLD, DM and questionable asthma and questionable CHF who is post op day 1 after laparoscopic appendectomy who is complaining of chest pain.   On evaluation, her reported 10/10 chest pain is very much out of proportion to the exam. She notes dyspnea and chest pain at home, which has been well documented in her records from numerous visits to the ED as well as cardiology and pulmonology within the past year, that has all be non-revealing. From a cardiac perspective she had a normal cath in 2008 with a non-revealing stress myoview in the end of 2012. Her most recent echo is 2013 showed no systolic dysfunction or pulm HTN and did not comment on diastolic dysfunction. Her dyspnea was attributed to central obesity and deconditioning according to the pulmonologist in 2013. Additionally, the patient has a history of reported PE, but she has also had 28 chest CT's since 2002, increasing th risk of having one false positive. Currently though, she is at higher risk for MI or PE in the post operative setting. PE is unlikely since she has a stable O2 requirement, no dyspnea, and no tachyacardia.   - Obtain troponin I x 3 - Give Lasix IV 40 mg IV x 1 - allow PO if chest pain resolves  Si Raider. Clinton Sawyer, MD, [facility] 06/04/2012, 10:53 AM Family Medicine Resident, PGY-2 [phone] pager

## 2012-06-04 NOTE — Progress Notes (Signed)
Not sure how this works in [employer] since I NIKE the above note.  The patient had appendicitis (not cholecystitis - note above).  Agree with above.  Ovidio Kin, MD, [facility] Pager: [phone] Office phone:  [phone]

## 2012-06-04 NOTE — Progress Notes (Addendum)
1 Day Post-Op  Subjective: O/N - Patient with increasing O2 requirement and coughing up blood clots, so patient placed on CPAP (has OSA) and moved to step down  Patient wearing BiPap when I walk in the room, she is fatigued by easily aroused; Her Bipap is removed by RT while I am in the room and she is placed on nasal cannula  Denies chest pain, shortness of breath, nausea, vomiting and abdominal pain Notes mild pain in roof of mouth BM x 1 yesterday evening   Objective: Vital signs in last 24 hours: Temp:  [97.5 F (36.4 C)-98.6 F (37 C)] 98.6 F (37 C) (02/11 0458) Pulse Rate:  [75-88] 76 (02/11 0725) Resp:  [14-22] 22 (02/11 0725) BP: (93-127)/(60-86) 95/63 mmHg (02/11 0725) SpO2:  [93 %-100 %] 98 % (02/11 0725) Weight:  [233 lb 4 oz (105.8 kg)] 233 lb 4 oz (105.8 kg) (02/11 0505) Last BM Date: 06/04/12  Intake/Output from previous day: 02/10 0701 - 02/11 0700 In: 1300 [I.V.:1300] Out: 150 [Urine:150] Intake/Output this shift:    PE: Gen: obese, middle-age AAF, NAD, wearing BiPap CV: RRR, no murmurs Pulm: normal WOB Abd: obese, mildly distended, mildly tender, no guarding or rigidity, hypoactive bowel sounds; 3 small incisions closed without evidence of dehiscence Extremities: LE with edema of tenderness bilaterally   Lab Results:   Recent Labs  06/03/12 2146 06/04/12 0600  WBC 8.7 9.5  HGB 12.8 11.7*  HCT 38.4 35.8*  PLT 277 236   BMET  Recent Labs  06/03/12 2146 06/04/12 0600  NA 138 141  K 3.9 3.4*  CL 99 102  CO2 28 31  GLUCOSE 98 123*  BUN 8 9  CREATININE 0.76 0.85  CALCIUM 9.7 8.8   PT/INR  Recent Labs  06/03/12 2146  LABPROT 13.1  INR 1.00   CMP     Component Value Date/Time   NA 141 06/04/2012 0600   K 3.4* 06/04/2012 0600   CL 102 06/04/2012 0600   CO2 31 06/04/2012 0600   GLUCOSE 123* 06/04/2012 0600   BUN 9 06/04/2012 0600   CREATININE 0.85 06/04/2012 0600   CALCIUM 8.8 06/04/2012 0600   PROT 7.1 04/19/2012 2121   ALBUMIN 3.6  04/19/2012 2121   AST 18 04/19/2012 2121   ALT 25 04/19/2012 2121   ALKPHOS 54 04/19/2012 2121   BILITOT 0.2* 04/19/2012 2121   GFRNONAA 77* 06/04/2012 0600   GFRAA 90* 06/04/2012 0600   Lipase     Component Value Date/Time   LIPASE 38 03/15/2011 1125       Studies/Results: Ct Abdomen Pelvis W Contrast  06/03/2012  **ADDENDUM** CREATED: 06/03/2012 21:25:57  The original report was by Dr. Dr. Loraine Leriche showed grade.  The following addendum is by Dr. Gaylyn Rong:  Emergency findings were called to Dr. Lorain Childes by the CT technologist at approximately 9:00 p.m. on 06/03/2012, and the patient was sent to the emergency department.  **END ADDENDUM** SIGNED BY: Soyla Murphy. Ova Freshwater, M.D.   06/03/2012  *RADIOLOGY REPORT*  Clinical Data: Abdominal pain.  Right-sided cramping.  CT ABDOMEN AND PELVIS WITH CONTRAST  Technique:  Multidetector CT imaging of the abdomen and pelvis was performed following the standard protocol during bolus administration of intravenous contrast.  Contrast: OMNIPAQUE IOHEXOL 300 MG/ML  SOLN  Comparison: 09/20/2008  Findings: Lung bases are clear.  No pleural or pericardial fluid. The liver shows fatty change without focal lesions or biliary ductal dilatation.  No calcified gallstones.  The spleen is normal. The  pancreas is normal.  The adrenal glands are normal.  The kidneys are normal.  No cyst, mass, stone or hydronephrosis.  The aorta and IVC are normal.  There is acute appendicitis with a thickened appendix and periappendiceal stranding but no rupture or abscess.  The appendix is largely retrocecal.  There has been previous hysterectomy.  No pelvic mass.  Ordinary degenerative changes effect the lower lumbar spine.  IMPRESSION: Acute appendicitis without rupture or abscess.   Original Report Authenticated By: Paulina Fusi, M.D.    Dg Chest Port 1 View  06/03/2012  *RADIOLOGY REPORT*  Clinical Data: Chest and abdominal pain.  PORTABLE CHEST - 1 VIEW  Comparison:  04/27/2012  Findings: Cardiac and mediastinal contours appear normal.  The lungs appear clear.  No pleural effusion is identified.  Thoracic spondylosis noted.  IMPRESSION: 1.  No acute thoracic findings are apparent.  2.  Thoracic spondylosis.   Original Report Authenticated By: Gaylyn Rong, M.D.     Anti-infectives: Anti-infectives   Start     Dose/Rate Route Frequency Ordered Stop   06/03/12 2315  ertapenem (INVANZ) 1 g in sodium chloride 0.9 % 50 mL IVPB     1 g 100 mL/hr over 30 Minutes Intravenous Every 24 hours 06/03/12 2307         Assessment/Plan 52 year old F who underwent Laparoscopic Appendectomy on 2/10 for acute appendicitis with peritonitis.  Lap Appendectomy - POD1   - Doing well from GI perspective with BM x 1, no nausea or vomiting - Cont Invanz q 24 - Toradol for pain  Resp - Patient with respiratory distress last night likely combination of obstructive sleep apnea and atelecatasis from surgery, transitioned of BiPap while I am was in the room and started using incentive spirometer; Low index of suspicion for PE or CHF exacerbation - Cont IS and O2 via nasal cannula - Hold NPO until stable off bipap > 3 hours  HTN - BP currently well controlled - resume home meds this PM  HLD - Cont home meds when able to tolerated  Dispo - Currently in stepdown, consider transfer out when stable from resp standpoint   Si Raider. Clinton Sawyer, MD, MBA 06/04/2012, 8:32 AM Family Medicine Resident, PGY-2 [phone] pager     LOS: 1 day    [name], [name] 06/04/2012, 8:03 AM Pager: [phone]

## 2012-06-04 NOTE — Transfer of Care (Signed)
Immediate Anesthesia Transfer of Care Note  Patient: [name]  Procedure(s) Performed: Procedure(s): APPENDECTOMY LAPAROSCOPIC (N/A)  Patient Location: PACU  Anesthesia Type:General  Level of Consciousness: awake and alert   Airway & Oxygen Therapy: Patient Spontanous Breathing and non-rebreather face mask  Post-op Assessment: Report given to PACU RN and Post -op Vital signs reviewed and stable  Post vital signs: Reviewed and stable  Complications: No apparent anesthesia complications

## 2012-06-04 NOTE — Progress Notes (Signed)
Patient taken off BIPAP and placed on 4LNC. MD at bedside, sat patient straight up in bed and encouraged to cough. IS taught by RT, and placed at bedside. RT will continue to monitor. BIPAP still on standby at bedside

## 2012-06-04 NOTE — Op Note (Signed)
Appendectomy, Lap, Procedure Note  Indications: The patient presented with a history of right-sided abdominal pain. A CT revealed findings consistent with acute appendicitis.  Pre-operative Diagnosis: Acute appendicitis with generalized peritonitis  Post-operative Diagnosis: Same  Surgeon: Almond Lint   Assistants: n/a  Anesthesia: General endotracheal anesthesia and Local anesthesia   ASA Class: 3  Procedure Details  The patient was seen again in the Holding Room. The risks, benefits, complications, treatment options, and expected outcomes were discussed with the patient and/or family. The possibilities of perforation of viscus, bleeding, recurrent infection, the need for additional procedures, failure to diagnose a condition, and creating a complication requiring transfusion or operation were discussed. There was concurrence with the proposed plan and informed consent was obtained. The site of surgery was properly noted. The patient was taken to Operating Room, identified as [name] and the procedure verified as Appendectomy. A Time Out was held and the above information confirmed.  The patient was placed in the supine position and general anesthesia was induced, along with placement of orogastric tube, Venodyne boots, and a Foley catheter. The abdomen was prepped and draped in a sterile fashion. Local anesthetic was infiltrated in the infraumbilical region.  A 1.5 cm curvilinear transverse incision was made just below the umbilicus.  The Kelly clamp was used to spread the subcutaneous tissues.  The fascia was elevated with 2 Kocher clamps and incised with the #11 blade.  A Tresa Endo was used to confirm entrance into the peritoneal cavity.  A pursestring suture was placed around the fascial incision.  The Hasson trocar was inserted into the abdomen and held in place with the tails of the suture.  The pneumoperitoneum was then established to steady pressure of 15 mmHg.     Additional 5  mm cannulas then placed in the left lower quadrant of the abdomen and the suprapubic region under direct visualization.  A careful evaluation of the entire abdomen was carried out. The patient was placed in Trendelenburg and rotated to the left.  The small intestines were retracted in the cephalad and left lateral direction away from the pelvis and right lower quadrant. The patient was found to have an enlarged and inflamed appendix that was extending into the pelvis. There was no evidence of perforation.  The appendix was carefully dissected. The appendix was was skeletonized with the harmonic scalpel.   The appendix was divided at its base using an endo-GIA stapler. Minimal appendiceal stump was left in place. The appendix was removed from the abdomen with an Endocatch bag through the left subcostal port.  There was no evidence of bleeding, leakage, or complication after division of the appendix. Irrigation was also performed and irrigate suctioned from the abdomen as well.  The 5 mm trocars were removed.  The pneumoperitoneum was evacuated from the abdomen.    The trocar site skin wounds were closed with 4-0 Monocryl and dressed with Dermabond.  Instrument, sponge, and needle counts were correct at the conclusion of the case.   Findings: The appendix was found to be inflamed. There were not signs of necrosis.  There was not perforation. There was not abscess formation.  Estimated Blood Loss:  Minimal         Drains: none         Specimens: appendix to pathology         Complications:  None; patient tolerated the procedure well.         Disposition: PACU - hemodynamically stable.  Condition: stable

## 2012-06-04 NOTE — Progress Notes (Signed)
Pt. Complaining of chest pain. Pt. States that at home she using her " inhaler with the steriods" and that helps with chest pain. MD called made aware. States to give inhaler, nitro tablet, 2mg  morphine, chest x-ray, EKG and troponin. Will continue to monitor.

## 2012-06-04 NOTE — Anesthesia Postprocedure Evaluation (Signed)
Anesthesia Post Note  Patient: [name]  Procedure(s) Performed: Procedure(s) (LRB): APPENDECTOMY LAPAROSCOPIC (N/A)  Anesthesia type: general  Patient location: PACU  Post pain: Pain level controlled  Post assessment: Patient's Cardiovascular Status Stable  Last Vitals:  Filed Vitals:   06/04/12 0243  BP: 103/66  Pulse: 80  Temp:   Resp: 17    Post vital signs: Reviewed and stable  Level of consciousness: sedated  Complications: No apparent anesthesia complications

## 2012-06-04 NOTE — Progress Notes (Signed)
UR completed 

## 2012-06-04 NOTE — Progress Notes (Signed)
Called by RN on 6N to evaluate a patient on CPAP coughing up blood. When I arrived the patient was on a full face cpap per her home use although at home she requires no O2 and is currently requiring 10L of O2 to maintain SPO2 of 94%. The patient was placed on CPAP in the PACU due to high O2 requirements and was transported to 6N by the PACU RN. The patient is awake and alert and continues with SPO2 issues. MD called and BIPAP requested along with an order for stepdown. Patient will be transported to 3100 on BIPAP. BBS diminished HR 96 RR 26 SPO2 on BIPAP 98%

## 2012-06-05 LAB — BASIC METABOLIC PANEL
BUN: 7 mg/dL (ref 6–23)
CO2: 31 mEq/L (ref 19–32)
Calcium: 8.5 mg/dL (ref 8.4–10.5)
Chloride: 101 mEq/L (ref 96–112)
Creatinine, Ser: 0.83 mg/dL (ref 0.50–1.10)
GFR calc Af Amer: >90 mL/min (ref >90)
GFR calc non Af Amer: 80 mL/min — ABNORMAL LOW (ref >90)
Glucose, Bld: 170 mg/dL — ABNORMAL HIGH (ref 70–99)
Potassium: 3.6 mEq/L (ref 3.5–5.1)
Sodium: 137 mEq/L (ref 135–145)

## 2012-06-05 LAB — CBC
HCT: 34.9 % — ABNORMAL LOW (ref 36.0–46.0)
Hemoglobin: 11.2 g/dL — ABNORMAL LOW (ref 12.0–15.0)
MCH: 28.4 pg (ref 26.0–34.0)
MCHC: 32.1 g/dL (ref 30.0–36.0)
MCV: 88.4 fL (ref 78.0–100.0)
Platelets: 225 10*3/uL (ref 150–400)
RBC: 3.95 MIL/uL (ref 3.87–5.11)
RDW: 13.5 % (ref 11.5–15.5)
WBC: 7.6 10*3/uL (ref 4.0–10.5)

## 2012-06-05 MED ORDER — TRAMADOL HCL 50 MG PO TABS: 50.0000 mg | ORAL_TABLET | Freq: Four times a day (QID) | ORAL | Status: DC | PRN

## 2012-06-05 MED ORDER — METRONIDAZOLE 500 MG PO TABS: 500.0000 mg | ORAL_TABLET | Freq: Two times a day (BID) | ORAL | Status: DC

## 2012-06-05 MED ORDER — MENTHOL 3 MG MT LOZG
1.0000 | LOZENGE | OROMUCOSAL | Status: DC | PRN
  Filled 2012-06-05: qty 9

## 2012-06-05 MED ORDER — CIPROFLOXACIN HCL 500 MG PO TABS: 500.0000 mg | ORAL_TABLET | Freq: Two times a day (BID) | ORAL | Status: DC

## 2012-06-05 MED ORDER — PHENOL 1.4 % MT LIQD
1.0000 | OROMUCOSAL | Status: DC | PRN
  Filled 2012-06-05: qty 177

## 2012-06-05 NOTE — Progress Notes (Signed)
2 Days Post-Op  Subjective: Pt feeling better today.  Notes minimal and improved SOB, no chest pain, no N/V, and minimal abdominal pain.  Pt ambulating to chair.  Pt using IS and at about 1000.  +flatus and BM, normal urination.  Tolerating reg diet well.  Objective: Vital signs in last 24 hours: Temp:  [99 F (37.2 C)-100.3 F (37.9 C)] 100.3 F (37.9 C) (02/12 0400) Pulse Rate:  [75-97] 86 (02/12 1000) Resp:  [17-28] 21 (02/12 1000) BP: (91-132)/(52-88) 98/62 mmHg (02/12 1000) SpO2:  [94 %-100 %] 98 % (02/12 1000) Last BM Date: 06/04/12  Intake/Output from previous day: 02/11 0701 - 02/12 0700 In: 2173.3 [I.V.:2073.3; IV Piggyback:100] Out: 955 [Urine:955] Intake/Output this shift: Total I/O In: 300 [I.V.:300] Out: -   PE: Gen:  Alert, NAD, pleasant Card:  RRR, no M/G/R heard Pulm:  CTA, no W/R/R  Abd: Soft, NT/ND, +BS, no HSM, incisions C/D/I Ext:  No erythema, edema, or tenderness  Lab Results:   Recent Labs  06/04/12 0600 06/05/12 0425  WBC 9.5 7.6  HGB 11.7* 11.2*  HCT 35.8* 34.9*  PLT 236 225   BMET  Recent Labs  06/04/12 0600 06/05/12 0425  NA 141 137  K 3.4* 3.6  CL 102 101  CO2 31 31  GLUCOSE 123* 170*  BUN 9 7  CREATININE 0.85 0.83  CALCIUM 8.8 8.5   PT/INR  Recent Labs  06/03/12 2146  LABPROT 13.1  INR 1.00   CMP     Component Value Date/Time   NA 137 06/05/2012 0425   K 3.6 06/05/2012 0425   CL 101 06/05/2012 0425   CO2 31 06/05/2012 0425   GLUCOSE 170* 06/05/2012 0425   BUN 7 06/05/2012 0425   CREATININE 0.83 06/05/2012 0425   CALCIUM 8.5 06/05/2012 0425   PROT 7.1 04/19/2012 2121   ALBUMIN 3.6 04/19/2012 2121   AST 18 04/19/2012 2121   ALT 25 04/19/2012 2121   ALKPHOS 54 04/19/2012 2121   BILITOT 0.2* 04/19/2012 2121   GFRNONAA 80* 06/05/2012 0425   GFRAA >90 06/05/2012 0425   Lipase     Component Value Date/Time   LIPASE 38 03/15/2011 1125       Studies/Results: Ct Abdomen Pelvis W Contrast  06/03/2012   **ADDENDUM** CREATED: 06/03/2012 21:25:57  The original report was by Dr. Dr. Loraine Leriche showed grade.  The following addendum is by Dr. Gaylyn Rong:  Emergency findings were called to Dr. Lorain Childes by the CT technologist at approximately 9:00 p.m. on 06/03/2012, and the patient was sent to the emergency department.  **END ADDENDUM** SIGNED BY: Soyla Murphy. Ova Freshwater, M.D.   06/03/2012  *RADIOLOGY REPORT*  Clinical Data: Abdominal pain.  Right-sided cramping.  CT ABDOMEN AND PELVIS WITH CONTRAST  Technique:  Multidetector CT imaging of the abdomen and pelvis was performed following the standard protocol during bolus administration of intravenous contrast.  Contrast: OMNIPAQUE IOHEXOL 300 MG/ML  SOLN  Comparison: 09/20/2008  Findings: Lung bases are clear.  No pleural or pericardial fluid. The liver shows fatty change without focal lesions or biliary ductal dilatation.  No calcified gallstones.  The spleen is normal. The pancreas is normal.  The adrenal glands are normal.  The kidneys are normal.  No cyst, mass, stone or hydronephrosis.  The aorta and IVC are normal.  There is acute appendicitis with a thickened appendix and periappendiceal stranding but no rupture or abscess.  The appendix is largely retrocecal.  There has been previous hysterectomy.  No pelvic  mass.  Ordinary degenerative changes effect the lower lumbar spine.  IMPRESSION: Acute appendicitis without rupture or abscess.   Original Report Authenticated By: Paulina Fusi, M.D.    Dg Chest Port 1 View  06/04/2012  *RADIOLOGY REPORT*  Clinical Data: Chest pain and dyspnea  PORTABLE CHEST - 1 VIEW  Comparison: Chest radiograph 02/10/2014and CT chest 04/27/2012  Findings: Cardiac leads project over the chest. There is mild cardiomegaly.  Pulmonary vascular congestion is present with bilateral airspace disease, most prominent in the perihilar regions and lung bases.  Aeration of the lungs has decreased since yesterday's chest radiograph.  Trachea is  midline.  No visible pleural effusion.  IMPRESSION: Pulmonary vascular congestion with mild bilateral airspace disease. Findings are most suggestive of congestive heart failure.  Suggest clinical correlation.  Infection or aspiration cannot be excluded by the imaging appearance.   Original Report Authenticated By: Britta Mccreedy, M.D.    Dg Chest Port 1 View  06/03/2012  *RADIOLOGY REPORT*  Clinical Data: Chest and abdominal pain.  PORTABLE CHEST - 1 VIEW  Comparison: 04/27/2012  Findings: Cardiac and mediastinal contours appear normal.  The lungs appear clear.  No pleural effusion is identified.  Thoracic spondylosis noted.  IMPRESSION: 1.  No acute thoracic findings are apparent.  2.  Thoracic spondylosis.   Original Report Authenticated By: Gaylyn Rong, M.D.     Anti-infectives: Anti-infectives   Start     Dose/Rate Route Frequency Ordered Stop   06/03/12 2315  ertapenem (INVANZ) 1 g in sodium chloride 0.9 % 50 mL IVPB     1 g 100 mL/hr over 30 Minutes Intravenous Every 24 hours 06/03/12 2307         Assessment/Plan 52 year old F with PMH of HTN, HLD, DM and questionable asthma and questionable CHF who is post op day 1 after laparoscopic appendectomy who is complaining of chest pain.   Lap Appendectomy - POD1 for acute appendicitis with peritonitis - Doing well from GI perspective with BM x 1, no nausea or vomiting  - Cont Invanz q 24, will need to go home on oral ABX  - Toradol for pain  Resp/chest pain - Resolving, relying on [place] O2 much less today, Troponin's negative, cardiac monitoring has been normal, pt has had >20 CT Chest studies since 2000 for chronic chest pain HTN - BP currently well controlled on home meds HLD - Cont home meds when able to tolerated  Dispo - Reg diet, currently in stepdown in 3100, being transferred to a stepdown bed, will likely be discharged today   LOS: 2 days   DORT, [facility] 06/05/2012, 10:41 AM Pager: [phone]   Agree with above.  Ovidio Kin, MD, [facility] Surgery Pager: [phone] Office phone:  [phone]

## 2012-06-05 NOTE — Discharge Summary (Signed)
Physician Discharge Summary  Patient ID: THI KLICH MRN: [id number] DOB/AGE: Jul 06, 1960 52 y.o.  Admit date: 06/03/2012 Discharge date: 06/05/2012  Admitting Diagnosis: Acute appendicitis Abdominal pain Nausea Diarrhea Anorexia  Discharge Diagnosis Patient Active Problem List   Diagnosis Date Noted  . Diastolic CHF 01/09/2012  . Mediastinal mass 01/09/2012  . Hypotension 01/08/2012  . Chest pain 01/08/2012  . DOE (dyspnea on exertion) 06/20/2011  . Mediastinal abnormality 06/20/2011  . Chest pain 05/09/2011  . HYPERCHOLESTEROLEMIA 12/23/2009  . GLAUCOMA 12/23/2009  . ARTHRITIS 12/23/2009  . CHEST PAIN 12/23/2009    Class: Acute  . 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 HYPERLIPIDEMIA NEC/NOS 09/13/2006  . OBESITY NOS 09/13/2006  . ANXIETY STATE NOS 09/13/2006  . DISORDER, DEPRESSIVE NEC 09/13/2006  . CARPAL TUNNEL SYNDROME, MILD 09/13/2006  . Unspecified Essential Hypertension 09/13/2006  . IBS 09/13/2006  . DEGENERATION, LUMBAR/LUMBOSACRAL DISC 09/13/2006  . SYMPTOM, SWELLING/MASS/LUMP IN CHEST 09/13/2006  . PULMONARY EMBOLISM, HX OF 09/13/2006    Consultants None  Imaging: Ct Abdomen Pelvis W Contrast  06/03/2012  **ADDENDUM** CREATED: 06/03/2012 21:25:57  The original report was by Dr. Dr. Loraine Leriche showed grade.  The following addendum is by Dr. Gaylyn Rong:  Emergency findings were called to Dr. Lorain Childes by the CT technologist at approximately 9:00 p.m. on 06/03/2012, and the patient was sent to the emergency department.  **END ADDENDUM** SIGNED BY: Soyla Murphy. Ova Freshwater, M.D.   06/03/2012  *RADIOLOGY REPORT*  Clinical Data: Abdominal pain.  Right-sided cramping.  CT ABDOMEN AND PELVIS WITH CONTRAST  Technique:  Multidetector CT imaging of the abdomen and pelvis was performed following the standard protocol during bolus administration of intravenous contrast.  Contrast: OMNIPAQUE IOHEXOL 300 MG/ML  SOLN  Comparison: 09/20/2008  Findings: Lung bases are  clear.  No pleural or pericardial fluid. The liver shows fatty change without focal lesions or biliary ductal dilatation.  No calcified gallstones.  The spleen is normal. The pancreas is normal.  The adrenal glands are normal.  The kidneys are normal.  No cyst, mass, stone or hydronephrosis.  The aorta and IVC are normal.  There is acute appendicitis with a thickened appendix and periappendiceal stranding but no rupture or abscess.  The appendix is largely retrocecal.  There has been previous hysterectomy.  No pelvic mass.  Ordinary degenerative changes effect the lower lumbar spine.  IMPRESSION: Acute appendicitis without rupture or abscess.   Original Report Authenticated By: Paulina Fusi, M.D.    Dg Chest Port 1 View  06/04/2012  *RADIOLOGY REPORT*  Clinical Data: Chest pain and dyspnea  PORTABLE CHEST - 1 VIEW  Comparison: Chest radiograph 02/10/2014and CT chest 04/27/2012  Findings: Cardiac leads project over the chest. There is mild cardiomegaly.  Pulmonary vascular congestion is present with bilateral airspace disease, most prominent in the perihilar regions and lung bases.  Aeration of the lungs has decreased since yesterday's chest radiograph.  Trachea is midline.  No visible pleural effusion.  IMPRESSION: Pulmonary vascular congestion with mild bilateral airspace disease. Findings are most suggestive of congestive heart failure.  Suggest clinical correlation.  Infection or aspiration cannot be excluded by the imaging appearance.   Original Report Authenticated By: Britta Mccreedy, M.D.    Dg Chest Port 1 View  06/03/2012  *RADIOLOGY REPORT*  Clinical Data: Chest and abdominal pain.  PORTABLE CHEST - 1 VIEW  Comparison: 04/27/2012  Findings: Cardiac and mediastinal contours appear normal.  The lungs appear clear.  No pleural effusion is identified.  Thoracic spondylosis noted.  IMPRESSION: 1.  No acute thoracic findings are apparent.  2.  Thoracic spondylosis.   Original Report Authenticated By: Gaylyn Rong, M.D.     Procedures Laparoscopic Appendectomy  Hospital Course:  52 year old female who presents with around 1-1/2 days of crampy diffuse abdominal pain, anorexia, and diarrhea.  Started on 06/02/12 this began to localize to the right side of the abdomen. Over the course of the last 24 hours, the pain worsened and became more severe. She had anorexia since Saturday. She complains of nausea but no vomiting. She denied fevers and chills. She described the abdominal cramping pain as similar to when she had a baby. She has had 2 laparoscopic surgeries for "exploration". She has also had a hysterectomy. She described the pain as 9/10.   Workup showed acute cholecystitis with peritonitis.  Patient was admitted and underwent procedure listed above.  Tolerated procedure well and was transferred to the floor.  On POD #1 she experienced some SOB/chest pain.  Her O2 requirements went up and she was started on CPAP given her history of OSA.  She was transferred to step down for monitoring.  She was transitioned to [facility] and did well.  Her EKG and labs were normal including 3 troponins.  She has a h/o of chronic chest pain and SOB and has been followed by [facility] cardiology and pulmonology.  She has had 28 chest CT's since 2002.  The pain and SOB has since resolved.  Diet was advanced as tolerated.  On POD #2, the patient was voiding well, tolerating diet, ambulating well, pain well controlled, vital signs stable, incisions c/d/i and felt stable for discharge home.  Patient will follow up in our office in 2-3 weeks and knows to call with questions or concerns.  The patient is to follow up with her cardiologist and pulmonologist for post hospital follow ups as needed.     Medication List    STOP taking these medications       HYDROcodone-acetaminophen 5-325 MG per tablet  Commonly known as:  NORCO/VICODIN      TAKE these medications       albuterol 90 MCG/ACT inhaler  Commonly known as:   PROVENTIL,VENTOLIN  Inhale 2 puffs into the lungs 3 (three) times daily as needed. For shortness of breath     aspirin 81 MG tablet  Take 81 mg by mouth daily.     atorvastatin 20 MG tablet  Commonly known as:  LIPITOR  Take 40 mg by mouth at bedtime.     beclomethasone 80 MCG/ACT inhaler  Commonly known as:  QVAR  Inhale 1 puff into the lungs 2 (two) times daily.     carvedilol 10 MG 24 hr capsule  Commonly known as:  COREG CR  Take 1 capsule (10 mg total) by mouth daily.     diazepam 5 MG tablet  Commonly known as:  VALIUM  Take 5 mg by mouth at bedtime as needed. For sleep     furosemide 40 MG tablet  Commonly known as:  LASIX  Take 40 mg by mouth daily.     ibuprofen 200 MG tablet  Commonly known as:  ADVIL,MOTRIN  Take 200 mg by mouth every 6 (six) hours as needed. For pain     isosorbide dinitrate 20 MG tablet  Commonly known as:  ISORDIL  Take 20 mg by mouth 2 (two) times daily.     lidocaine 5 %  Commonly known as:  LIDODERM  Place 1 patch onto  the skin daily. Remove & Discard patch within 12 hours or as directed by MD     lisinopril 20 MG tablet  Commonly known as:  PRINIVIL,ZESTRIL  Take 20 mg by mouth 2 (two) times daily.     loratadine 10 MG tablet  Commonly known as:  CLARITIN  Take 10 mg by mouth daily.     metFORMIN 1000 MG tablet  Commonly known as:  GLUCOPHAGE  Take 1,000 mg by mouth 2 (two) times daily with a meal.     multivitamin with minerals tablet  Take 1 tablet by mouth daily.     nitroGLYCERIN 0.4 MG SL tablet  Commonly known as:  NITROSTAT  Place 0.4 mg under the tongue every 5 (five) minutes as needed. For chest pain.     omeprazole 20 MG capsule  Commonly known as:  PRILOSEC  Take 20 mg by mouth daily.     potassium chloride 10 MEQ tablet  Commonly known as:  K-DUR,KLOR-CON  Take 2 tablets (20 mEq total) by mouth 2 (two) times daily.     sertraline 100 MG tablet  Commonly known as:  ZOLOFT  Take 100 mg by mouth daily.      traMADol 50 MG tablet  Commonly known as:  ULTRAM  Take 1-2 tablets (50-100 mg total) by mouth every 6 (six) hours as needed for pain.             Follow-up Information   Follow up with [facility] On 06/25/2012. (APPT AT 11:45AM, ARRIVE AT 11:15AM FOR CHECK IN)    Contact information:   [address]   [place] [address] [phone]       Signed: Candiss Norse [facility] [phone]  06/05/2012, 12:13 PM  Agree with above.  Not sure she will get out because of the snow.  Ovidio Kin, MD, [facility] Pager: [phone] Office phone:  [phone]

## 2012-06-06 ENCOUNTER — Encounter (INDEPENDENT_AMBULATORY_CARE_PROVIDER_SITE_OTHER): Payer: Self-pay | Admitting: General Surgery

## 2012-06-06 NOTE — Progress Notes (Signed)
Patient ID: [name], female   DOB: [birth year], 52 y.o.   MRN: [id number] [name] from [employer] called and stated that [name] (discharged yesterday after having an appendectomy on 2/10) was unable to get her antibiotic prescription filled due to the weather limiting a delivery prescription service she uses.  She apparently is working on other means to fill the prescription.

## 2012-06-07 ENCOUNTER — Telehealth (INDEPENDENT_AMBULATORY_CARE_PROVIDER_SITE_OTHER): Payer: Self-pay | Admitting: *Deleted

## 2012-06-07 NOTE — Telephone Encounter (Signed)
[name], CM with [employer] called to state that patient has been unable to fill Cipro and Flagyl prescription due to the weather.  [facility] is willing to home deliver medications so the prescriptions were called into their voicemail at this time.  Cipro 500mg  1tab twice daily for 7 days #14 no refill and Flaygl 500mg  1tab twice daily for 7 days #14 no refill

## 2012-06-10 LAB — GLUCOSE, CAPILLARY: Glucose-Capillary: 109 mg/dL — ABNORMAL HIGH (ref 70–99)

## 2012-06-21 ENCOUNTER — Ambulatory Visit (HOSPITAL_COMMUNITY): Payer: Medicare Other

## 2012-06-24 ENCOUNTER — Ambulatory Visit (HOSPITAL_COMMUNITY): Payer: Medicare Other

## 2012-06-25 ENCOUNTER — Encounter (INDEPENDENT_AMBULATORY_CARE_PROVIDER_SITE_OTHER): Payer: Self-pay

## 2012-06-25 ENCOUNTER — Ambulatory Visit (INDEPENDENT_AMBULATORY_CARE_PROVIDER_SITE_OTHER): Payer: Medicare Other | Admitting: General Surgery

## 2012-06-25 VITALS — BP 124/86 | HR 88 | Temp 98.2°F | Resp 18 | Ht 60.0 in | Wt 231.2 lb

## 2012-06-25 DIAGNOSIS — Z9889 Other specified postprocedural states: Secondary | ICD-10-CM

## 2012-06-25 DIAGNOSIS — K352 Acute appendicitis with generalized peritonitis, without abscess: Secondary | ICD-10-CM

## 2012-06-25 DIAGNOSIS — Z9049 Acquired absence of other specified parts of digestive tract: Secondary | ICD-10-CM

## 2012-06-25 NOTE — Progress Notes (Signed)
  Subjective: [name] is a 52 y.o. female who had a laparoscopic appendectomy with on 06/04/12 by Dr. Donell Beers.  The pathology report confirmed acute appendicitis and periappendicitis, no tumor noted.  The patient is tolerating their diet well and is having no severe pain.  Bowel function is good.  No problems with the wounds.  Pt is returning to normal activity.   Objective: Vital signs in last 24 hours: Reviewed   PE: General:  Alert, NAD, pleasant Abd: soft, NT/ND, +bs, incisions appear well-healed with no sign of infection or bleeding.     Assessment/Plan  1.  S/P Laparoscopic Appendectomy: doing well, may resume regular activity without restrictions, Pt will follow up with [name] [facility] and knows to call with questions or concerns.  No swimming pool for 2 more weeks or lifting >40lbs, but she can return to light - moderate exercise without restrictions.    Aris Georgia, PA-C 06/25/2012

## 2012-06-25 NOTE — Patient Instructions (Signed)
No lifting > 40lbs or hot tub/swimming pool for 2-3 weeks.  Return to normal activity and normal diet.

## 2012-06-25 NOTE — Addendum Note (Signed)
Addended by: Senaida Ores on: 06/25/2012 12:21 PM   Modules accepted: Level of Service

## 2012-07-03 ENCOUNTER — Ambulatory Visit (HOSPITAL_COMMUNITY): Payer: Medicare Other

## 2012-07-09 ENCOUNTER — Ambulatory Visit (HOSPITAL_COMMUNITY)
Admission: RE | Admit: 2012-07-09 | Discharge: 2012-07-09 | Disposition: A | Discharge status: Home / Self Care | Payer: Medicare Other | Source: Ambulatory Visit | Attending: Internal Medicine | Admitting: Internal Medicine

## 2012-07-09 DIAGNOSIS — Z1231 Encounter for screening mammogram for malignant neoplasm of breast: Secondary | ICD-10-CM

## 2012-07-21 ENCOUNTER — Emergency Department (HOSPITAL_COMMUNITY)
Admission: EM | Admit: 2012-07-21 | Discharge: 2012-07-21 | Disposition: A | Discharge status: Home / Self Care | Payer: Medicare Other | Attending: Emergency Medicine | Admitting: Emergency Medicine

## 2012-07-21 ENCOUNTER — Emergency Department (HOSPITAL_COMMUNITY): Payer: Medicare Other

## 2012-07-21 ENCOUNTER — Emergency Department (INDEPENDENT_AMBULATORY_CARE_PROVIDER_SITE_OTHER)
Admission: EM | Admit: 2012-07-21 | Discharge: 2012-07-21 | Disposition: A | Discharge status: Nursing Home (Includes ICF) | Payer: Medicare Other | Source: Home / Self Care

## 2012-07-21 ENCOUNTER — Encounter (HOSPITAL_COMMUNITY): Payer: Self-pay | Admitting: *Deleted

## 2012-07-21 ENCOUNTER — Encounter (HOSPITAL_COMMUNITY): Payer: Self-pay | Admitting: Neurology

## 2012-07-21 DIAGNOSIS — R5381 Other malaise: Secondary | ICD-10-CM | POA: Insufficient documentation

## 2012-07-21 DIAGNOSIS — J441 Chronic obstructive pulmonary disease with (acute) exacerbation: Secondary | ICD-10-CM | POA: Insufficient documentation

## 2012-07-21 DIAGNOSIS — R0609 Other forms of dyspnea: Secondary | ICD-10-CM

## 2012-07-21 DIAGNOSIS — E1159 Type 2 diabetes mellitus with other circulatory complications: Secondary | ICD-10-CM

## 2012-07-21 DIAGNOSIS — F411 Generalized anxiety disorder: Secondary | ICD-10-CM | POA: Insufficient documentation

## 2012-07-21 DIAGNOSIS — E119 Type 2 diabetes mellitus without complications: Secondary | ICD-10-CM | POA: Insufficient documentation

## 2012-07-21 DIAGNOSIS — I509 Heart failure, unspecified: Secondary | ICD-10-CM | POA: Insufficient documentation

## 2012-07-21 DIAGNOSIS — R0600 Dyspnea, unspecified: Secondary | ICD-10-CM

## 2012-07-21 DIAGNOSIS — I10 Essential (primary) hypertension: Secondary | ICD-10-CM | POA: Insufficient documentation

## 2012-07-21 DIAGNOSIS — Z87891 Personal history of nicotine dependence: Secondary | ICD-10-CM | POA: Insufficient documentation

## 2012-07-21 DIAGNOSIS — R109 Unspecified abdominal pain: Secondary | ICD-10-CM | POA: Insufficient documentation

## 2012-07-21 DIAGNOSIS — Z8709 Personal history of other diseases of the respiratory system: Secondary | ICD-10-CM | POA: Insufficient documentation

## 2012-07-21 DIAGNOSIS — IMO0002 Reserved for concepts with insufficient information to code with codable children: Secondary | ICD-10-CM | POA: Insufficient documentation

## 2012-07-21 DIAGNOSIS — R0989 Other specified symptoms and signs involving the circulatory and respiratory systems: Secondary | ICD-10-CM

## 2012-07-21 DIAGNOSIS — R0789 Other chest pain: Secondary | ICD-10-CM | POA: Insufficient documentation

## 2012-07-21 DIAGNOSIS — F3289 Other specified depressive episodes: Secondary | ICD-10-CM | POA: Insufficient documentation

## 2012-07-21 DIAGNOSIS — R110 Nausea: Secondary | ICD-10-CM | POA: Insufficient documentation

## 2012-07-21 DIAGNOSIS — R222 Localized swelling, mass and lump, trunk: Secondary | ICD-10-CM | POA: Insufficient documentation

## 2012-07-21 DIAGNOSIS — R197 Diarrhea, unspecified: Secondary | ICD-10-CM

## 2012-07-21 DIAGNOSIS — Z9861 Coronary angioplasty status: Secondary | ICD-10-CM | POA: Insufficient documentation

## 2012-07-21 DIAGNOSIS — Z79899 Other long term (current) drug therapy: Secondary | ICD-10-CM | POA: Insufficient documentation

## 2012-07-21 DIAGNOSIS — R42 Dizziness and giddiness: Secondary | ICD-10-CM | POA: Insufficient documentation

## 2012-07-21 DIAGNOSIS — R079 Chest pain, unspecified: Secondary | ICD-10-CM

## 2012-07-21 DIAGNOSIS — Z7982 Long term (current) use of aspirin: Secondary | ICD-10-CM | POA: Insufficient documentation

## 2012-07-21 DIAGNOSIS — Z86711 Personal history of pulmonary embolism: Secondary | ICD-10-CM | POA: Insufficient documentation

## 2012-07-21 DIAGNOSIS — Z8679 Personal history of other diseases of the circulatory system: Secondary | ICD-10-CM | POA: Insufficient documentation

## 2012-07-21 DIAGNOSIS — F329 Major depressive disorder, single episode, unspecified: Secondary | ICD-10-CM | POA: Insufficient documentation

## 2012-07-21 DIAGNOSIS — R5383 Other fatigue: Secondary | ICD-10-CM | POA: Insufficient documentation

## 2012-07-21 DIAGNOSIS — E1169 Type 2 diabetes mellitus with other specified complication: Secondary | ICD-10-CM

## 2012-07-21 DIAGNOSIS — J209 Acute bronchitis, unspecified: Secondary | ICD-10-CM

## 2012-07-21 HISTORY — DX: Chronic pulmonary edema: J81.1

## 2012-07-21 LAB — URINALYSIS, ROUTINE W REFLEX MICROSCOPIC
Bilirubin Urine: NEGATIVE
Glucose, UA: NEGATIVE mg/dL
Hgb urine dipstick: NEGATIVE
Ketones, ur: 15 mg/dL — AB
Leukocytes, UA: NEGATIVE
Nitrite: NEGATIVE
Protein, ur: NEGATIVE mg/dL
Specific Gravity, Urine: 1.026 (ref 1.005–1.030)
Urobilinogen, UA: 0.2 mg/dL (ref 0.0–1.0)
pH: 5.5 (ref 5.0–8.0)

## 2012-07-21 LAB — CBC WITH DIFFERENTIAL/PLATELET
Basophils Absolute: 0 10*3/uL (ref 0.0–0.1)
Basophils Relative: 1 % (ref 0–1)
Eosinophils Absolute: 0.9 10*3/uL — ABNORMAL HIGH (ref 0.0–0.7)
Eosinophils Relative: 13 % — ABNORMAL HIGH (ref 0–5)
HCT: 39.1 % (ref 36.0–46.0)
Hemoglobin: 12.6 g/dL (ref 12.0–15.0)
Lymphocytes Relative: 42 % (ref 12–46)
Lymphs Abs: 2.9 10*3/uL (ref 0.7–4.0)
MCH: 27.8 pg (ref 26.0–34.0)
MCHC: 32.2 g/dL (ref 30.0–36.0)
MCV: 86.3 fL (ref 78.0–100.0)
Monocytes Absolute: 0.4 10*3/uL (ref 0.1–1.0)
Monocytes Relative: 6 % (ref 3–12)
Neutro Abs: 2.6 10*3/uL (ref 1.7–7.7)
Neutrophils Relative %: 38 % — ABNORMAL LOW (ref 43–77)
Platelets: 236 10*3/uL (ref 150–400)
RBC: 4.53 MIL/uL (ref 3.87–5.11)
RDW: 13.6 % (ref 11.5–15.5)
WBC: 6.8 10*3/uL (ref 4.0–10.5)

## 2012-07-21 LAB — PRO B NATRIURETIC PEPTIDE: Pro B Natriuretic peptide (BNP): 53 pg/mL (ref 0–125)

## 2012-07-21 LAB — GLUCOSE, CAPILLARY: Glucose-Capillary: 83 mg/dL (ref 70–99)

## 2012-07-21 LAB — COMPREHENSIVE METABOLIC PANEL
ALT: 30 U/L (ref 0–35)
AST: 19 U/L (ref 0–37)
Albumin: 3.7 g/dL (ref 3.5–5.2)
Alkaline Phosphatase: 53 U/L (ref 39–117)
BUN: 9 mg/dL (ref 6–23)
CO2: 28 mEq/L (ref 19–32)
Calcium: 9.5 mg/dL (ref 8.4–10.5)
Chloride: 103 mEq/L (ref 96–112)
Creatinine, Ser: 0.72 mg/dL (ref 0.50–1.10)
GFR calc Af Amer: >90 mL/min (ref >90)
GFR calc non Af Amer: >90 mL/min (ref >90)
Glucose, Bld: 93 mg/dL (ref 70–99)
Potassium: 3.4 mEq/L — ABNORMAL LOW (ref 3.5–5.1)
Sodium: 141 mEq/L (ref 135–145)
Total Bilirubin: 0.3 mg/dL (ref 0.3–1.2)
Total Protein: 6.9 g/dL (ref 6.0–8.3)

## 2012-07-21 LAB — TROPONIN I: Troponin I: <0.3 ng/mL (ref <0.30)

## 2012-07-21 LAB — D-DIMER, QUANTITATIVE: D-Dimer, Quant: 0.52 ug/mL-FEU — ABNORMAL HIGH (ref 0.00–0.48)

## 2012-07-21 MED ORDER — PREDNISONE 20 MG PO TABS: 40.0000 mg | ORAL_TABLET | Freq: Every day | ORAL | Status: DC

## 2012-07-21 MED ORDER — ALBUTEROL SULFATE (5 MG/ML) 0.5% IN NEBU
2.5000 mg | INHALATION_SOLUTION | Freq: Once | RESPIRATORY_TRACT | Status: AC
  Administered 2012-07-21: 2.5 mg via RESPIRATORY_TRACT
  Filled 2012-07-21: qty 0.5

## 2012-07-21 MED ORDER — IBUPROFEN 800 MG PO TABS
800.0000 mg | ORAL_TABLET | Freq: Once | ORAL | Status: AC
  Administered 2012-07-21: 800 mg via ORAL
  Filled 2012-07-21: qty 1

## 2012-07-21 MED ORDER — AZITHROMYCIN 250 MG PO TABS: 250.0000 mg | ORAL_TABLET | Freq: Every day | ORAL | Status: DC

## 2012-07-21 MED ORDER — SODIUM CHLORIDE 0.9 % IV SOLN
Freq: Once | INTRAVENOUS | Status: AC
  Administered 2012-07-21: 14:00:00 via INTRAVENOUS

## 2012-07-21 MED ORDER — PREDNISONE 20 MG PO TABS
40.0000 mg | ORAL_TABLET | Freq: Once | ORAL | Status: AC
  Administered 2012-07-21: 40 mg via ORAL
  Filled 2012-07-21: qty 2

## 2012-07-21 MED ORDER — IPRATROPIUM BROMIDE 0.02 % IN SOLN
0.5000 mg | Freq: Once | RESPIRATORY_TRACT | Status: AC
  Administered 2012-07-21: 0.5 mg via RESPIRATORY_TRACT
  Filled 2012-07-21: qty 2.5

## 2012-07-21 MED ORDER — IOHEXOL 350 MG/ML SOLN
100.0000 mL | Freq: Once | INTRAVENOUS | Status: AC | PRN
  Administered 2012-07-21: 100 mL via INTRAVENOUS

## 2012-07-21 NOTE — ED Provider Notes (Signed)
3:07 PM  Date: 07/21/2012  Rate: 75  Rhythm: normal sinus rhythm  QRS Axis: normal  Intervals: normal QRS:  Poor R wave progression in precordial leads suggests possible old anterior myocardial infarction.  ST/T Wave abnormalities: nonspecific T wave changes  Conduction Disutrbances:none  Narrative Interpretation: Abnormal EKG  Old EKG Reviewed: unchanged    Carleene Cooper III, MD 07/21/12 [phone]

## 2012-07-21 NOTE — ED Notes (Signed)
C/O "feeling sick" over past 2 days.  C/O diarrhea since this morning without n/v.  C/O SOB and pressure in mid-chest through to back; states feels like when she has had CHF recently.  C/O HA x2 days.  C/O bilat hand and feet numbness in the morning intermittently.

## 2012-07-21 NOTE — ED Notes (Addendum)
Per [facility]-Pt comes from [facility] c/o CP x 3 days. At current 10/10 substernal radiating to back. Pt in NAD. BP 127/78, Sr 75, RR 18.

## 2012-07-21 NOTE — ED Provider Notes (Signed)
History     CSN: [id number]  Arrival date & time 07/21/12  1306   None     Chief Complaint  Patient presents with  . Shortness of Breath  . Back Pain    (Consider location/radiation/quality/duration/timing/severity/associated sxs/prior treatment) HPI Comments: 52 year old morbidly obese female presents with a two-day history of pressure in the anterior chest radiating to the back and abdomen. She is complaining of bloating in the abdomen. This morning she had diarrhea states she is unable to lie down and breathe. She has a history of CHF, known CAD,  hypertension, dyslipidemia and morbid obesity. Currently her condition is stable however she will be transferred.   Past Medical History  Diagnosis Date  . Hypertension   . Diabetes mellitus   . Asthma   . Anginal pain     a. NL cath in 2008;  b. Myoview 03/2011: dec uptake along mid anterior wall on stress imaging -> ? attenuation vs. ischemia, EF 65%;  c. Echo 04/2011: EF 55-60%, no RWMA, Gr 2 dd  . Depression   . Anxiety   . Pulmonary embolism     a. 2008 -> coumadin x 6 mos.  . Sleep apnea   . 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 Obesity   . CHF (congestive heart failure)   . CHF (congestive heart failure)   . Pulmonary edema     Past Surgical History  Procedure Laterality Date  . Abdominal hysterectomy  2005  . Leg surgery    . Tubal ligation  1989  . Left knee surgery  2008  . Laparoscopic appendectomy N/A 06/03/2012    Procedure: APPENDECTOMY LAPAROSCOPIC;  Surgeon: Almond Lint, MD;  Location: [facility] OR;  Service: General;  Laterality: N/A;  . Cardiac catheterization      Normal    Family History  Problem Relation Age of Onset  . Emphysema Mother   . Arthritis Mother   . Heart failure Mother     alive @ 2  . Asthma Brother   . Heart disease Paternal Grandfather   . Heart disease Father     died @ 44's.  . Stroke Father   . Colon cancer Maternal Grandfather     History  Substance  Use Topics  . Smoking status: Former Smoker -- 0.50 packs/day for 15 years    Types: Cigarettes  . Smokeless tobacco: Not on file  . Alcohol Use: No    OB History   Grav Para Term Preterm Abortions TAB SAB Ect Mult Living                  Review of Systems  Constitutional: Positive for activity change and fatigue. Negative for fever.  Respiratory: Positive for chest tightness and shortness of breath.   Cardiovascular: Positive for chest pain. Negative for leg swelling.  Gastrointestinal: Positive for nausea and abdominal pain. Negative for vomiting.  Neurological: Positive for headaches.    Allergies  Penicillins and Sulfa antibiotics  Home Medications   Current Outpatient Rx  Name  Route  Sig  Dispense  Refill  . albuterol (PROVENTIL,VENTOLIN) 90 MCG/ACT inhaler   Inhalation   Inhale 2 puffs into the lungs 3 (three) times daily as needed. For shortness of breath          . aspirin 81 MG tablet   Oral   Take 81 mg by mouth daily.           [name] atorvastatin (  LIPITOR) 20 MG tablet   Oral   Take 40 mg by mouth at bedtime.          . beclomethasone (QVAR) 80 MCG/ACT inhaler   Inhalation   Inhale 1 puff into the lungs 2 (two) times daily.         . carvedilol (COREG CR) 10 MG 24 hr capsule   Oral   Take 1 capsule (10 mg total) by mouth daily.   60 capsule   3   . diazepam (VALIUM) 5 MG tablet   Oral   Take 5 mg by mouth at bedtime as needed. For sleep         . furosemide (LASIX) 40 MG tablet   Oral   Take 40 mg by mouth daily.         [name] ibuprofen (ADVIL,MOTRIN) 200 MG tablet   Oral   Take 200 mg by mouth every 6 (six) hours as needed. For pain         . isosorbide dinitrate (ISORDIL) 20 MG tablet   Oral   Take 20 mg by mouth 2 (two) times daily.          [name] lidocaine (LIDODERM) 5 %   Transdermal   Place 1 patch onto the skin daily. Remove & Discard patch within 12 hours or as directed by MD         . lisinopril (PRINIVIL,ZESTRIL) 20 MG  tablet   Oral   Take 20 mg by mouth 2 (two) times daily.         [name] loratadine (CLARITIN) 10 MG tablet   Oral   Take 10 mg by mouth daily.         . metFORMIN (GLUCOPHAGE) 1000 MG tablet   Oral   Take 1,000 mg by mouth 2 (two) times daily with a meal.         . Multiple Vitamins-Minerals (MULTIVITAMIN WITH MINERALS) tablet   Oral   Take 1 tablet by mouth daily.           . nitroGLYCERIN (NITROSTAT) 0.4 MG SL tablet   Sublingual   Place 0.4 mg under the tongue every 5 (five) minutes as needed. For chest pain.         [name] omeprazole (PRILOSEC) 20 MG capsule   Oral   Take 20 mg by mouth daily.           . potassium chloride (K-DUR,KLOR-CON) 10 MEQ tablet   Oral   Take 2 tablets (20 mEq total) by mouth 2 (two) times daily.   30 tablet   0   . sertraline (ZOLOFT) 100 MG tablet   Oral   Take 100 mg by mouth daily.          . traMADol (ULTRAM) 50 MG tablet   Oral   Take 1-2 tablets (50-100 mg total) by mouth every 6 (six) hours as needed for pain.   40 tablet   0   . UNKNOWN TO PATIENT      Med for back pain           BP 149/97  Pulse 80  Temp(Src) 98.2 F (36.8 C) (Oral)  Resp 32  SpO2 95%  Physical Exam  Nursing note and vitals reviewed. Constitutional: She is oriented to person, place, and time. No distress.  Morbidly obese female sitting upright on the bed with mild tachycardia at me a. She is able to talk in complete sentences but with some increased effort respirations.  Neck: Normal range of motion. Neck supple.  Cardiovascular: Normal rate and regular rhythm.   Pulmonary/Chest: She has no wheezes. She has no rales.  Increased effort. Poor inspiratory volume.  Abdominal: Soft. There is no tenderness.  Musculoskeletal: She exhibits no edema and no tenderness.  Neurological: She is alert and oriented to person, place, and time. She exhibits normal muscle tone.  Skin: Skin is warm and dry.  Psychiatric: She has a normal mood and affect.     ED Course  Procedures (including critical care time)  Labs Reviewed - No data to display No results found. EKG: NSR  No ectopy. No St-T changes from  06/03/12  1. Dyspnea   2. Chest pain   3. CHF (congestive heart failure)   4. Diabetes mellitus       MDM  52 year old patient with known coronary artery disease and CHF had presented to the urgent care for chest pain, shortness of breath, orthopnea and bloating in the abdomen. Because of her risk factors and current complaints she is being transferred to the emergency department.        Hayden Rasmussen, NP 07/21/12 1347  Hayden Rasmussen, NP 07/21/12 1358  Hayden Rasmussen, NP 07/21/12 [phone]

## 2012-07-21 NOTE — ED Provider Notes (Signed)
3:45 PM  Date: 07/21/2012  Rate:75  Rhythm: normal sinus rhythm  QRS Axis: normal  Intervals: normal QRS:  Poor R wave progression in precordial leads suggests old anterior myocardial infarction.  ST/T Wave abnormalities: nonspecific T wave changes  Conduction Disutrbances:none  Narrative Interpretation: Abnormal EKG  Old EKG Reviewed: unchanged    Carleene Cooper III, MD 07/21/12 [phone]

## 2012-07-21 NOTE — Progress Notes (Signed)
51 yo with morbid obesity, recent appendectomy, prior Hx CHF, has had shortness of breath and chest pain for 2 days.  Se[facility] at [facility] and sent to [facility] ED for evaluation.  Exam shows her to be morbidly obese, ? Rales at bases, heart sounds normal, abdomen large, no peripheral edema.  Lab workup ordered.  Old charts reviewed.

## 2012-07-21 NOTE — ED Provider Notes (Signed)
History     CSN: [id number]  Arrival date & time 07/21/12  1439   First MD Initiated Contact with Patient 07/21/12 1501      Chief Complaint  Patient presents with  . Chest Pain    (Consider location/radiation/quality/duration/timing/severity/associated sxs/prior treatment) HPI Comments: Patient is a 52 year old female with extensive PMH including CHF, coronary artery disease, hypertension, dyslipidemia, morbid obesity, and pulmonary embolism in 2008 who presents for increasing shortness of breath and orthopnea that has been gradually worsening over the past 2 days. Patient states that her symptoms are worse when supine as well as with exertion and better when she is sitting upright and resting. Patient states her symptoms have been constant since onset. Patient admits to an associated substernal ache that she states is constant and radiates to her epigastric region as well as through to her back. She states the pain in her back, however is more of a sharp sensation. Patient had onset of nausea as well as a generalized abdominal aching discomfort yesterday. She also believes she has been more bloated in her abdomen and usual and states she has had episodes of dizziness when going from a seated to standing position. Patient states all the symptoms feel similar to when she was diagnosed with congestive heart failure in September 2013. Patient denies fever, visual disturbances, neck pain and stiffness, vomiting, melena, hematochezia, and swelling in her lower extremities. Patient's currently takes 40 mg first my daily for her CHF. She is followed by [facility] cardiology, though she does not recall the doctor's name.  Patient is a 52 y.o. female presenting with chest pain.  Chest Pain Associated symptoms: abdominal pain, dizziness, fatigue, nausea and shortness of breath   Associated symptoms: no cough, no fever, not vomiting and no weakness     Past Medical History  Diagnosis Date  . Hypertension    . Diabetes mellitus   . Asthma   . Anginal pain     a. NL cath in 2008;  b. Myoview 03/2011: dec uptake along mid anterior wall on stress imaging -> ? attenuation vs. ischemia, EF 65%;  c. Echo 04/2011: EF 55-60%, no RWMA, Gr 2 dd  . Depression   . Anxiety   . Pulmonary embolism     a. 2008 -> coumadin x 6 mos.  . Sleep apnea   . 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 Obesity   . CHF (congestive heart failure)   . CHF (congestive heart failure)   . Pulmonary edema     Past Surgical History  Procedure Laterality Date  . Abdominal hysterectomy  2005  . Leg surgery    . Tubal ligation  1989  . Left knee surgery  2008  . Laparoscopic appendectomy N/A 06/03/2012    Procedure: APPENDECTOMY LAPAROSCOPIC;  Surgeon: Almond Lint, MD;  Location: [facility] OR;  Service: General;  Laterality: N/A;  . Cardiac catheterization      Normal    Family History  Problem Relation Age of Onset  . Emphysema Mother   . Arthritis Mother   . Heart failure Mother     alive @ 25  . Asthma Brother   . Heart disease Paternal Grandfather   . Heart disease Father     died @ 8's.  . Stroke Father   . Colon cancer Maternal Grandfather     History  Substance Use Topics  . Smoking status: Former Smoker -- 0.50 packs/day for 15 years  Types: Cigarettes  . Smokeless tobacco: Not on file  . Alcohol Use: No    OB History   Grav Para Term Preterm Abortions TAB SAB Ect Mult Living                  Review of Systems  Constitutional: Positive for fatigue. Negative for fever and chills.  HENT: Negative for neck pain and neck stiffness.   Eyes: Negative for pain and visual disturbance.  Respiratory: Positive for chest tightness and shortness of breath. Negative for cough and wheezing.   Cardiovascular: Positive for chest pain. Negative for leg swelling.  Gastrointestinal: Positive for nausea and abdominal pain. Negative for vomiting and blood in stool.  Genitourinary: Negative for  dysuria and hematuria.  Skin: Negative for pallor.  Neurological: Positive for dizziness and light-headedness. Negative for syncope and weakness.  Psychiatric/Behavioral: Negative for confusion.  All other systems reviewed and are negative.    Allergies  Hydrocodone; Penicillins; and Sulfa antibiotics  Home Medications   Current Outpatient Rx  Name  Route  Sig  Dispense  Refill  . albuterol (PROVENTIL,VENTOLIN) 90 MCG/ACT inhaler   Inhalation   Inhale 2 puffs into the lungs 3 (three) times daily as needed. For shortness of breath          . aspirin 81 MG tablet   Oral   Take 81 mg by mouth daily.           [name] atorvastatin (LIPITOR) 40 MG tablet   Oral   Take 40 mg by mouth daily.         . beclomethasone (QVAR) 80 MCG/ACT inhaler   Inhalation   Inhale 1 puff into the lungs 2 (two) times daily.         . carvedilol (COREG CR) 10 MG 24 hr capsule   Oral   Take 1 capsule (10 mg total) by mouth daily.   60 capsule   3   . diazepam (VALIUM) 5 MG tablet   Oral   Take 5 mg by mouth at bedtime as needed. For sleep         . furosemide (LASIX) 40 MG tablet   Oral   Take 40 mg by mouth daily.         [name] ibuprofen (ADVIL,MOTRIN) 200 MG tablet   Oral   Take 200 mg by mouth every 6 (six) hours as needed. For pain         . isosorbide dinitrate (ISORDIL) 20 MG tablet   Oral   Take 20 mg by mouth 2 (two) times daily.          [name] lidocaine (LIDODERM) 5 %   Transdermal   Place 1 patch onto the skin daily as needed (for pain). Remove & Discard patch within 12 hours or as directed by MD         . lisinopril (PRINIVIL,ZESTRIL) 20 MG tablet   Oral   Take 20 mg by mouth 2 (two) times daily.         [name] loratadine (CLARITIN) 10 MG tablet   Oral   Take 10 mg by mouth daily.         . metFORMIN (GLUCOPHAGE) 1000 MG tablet   Oral   Take 1,000 mg by mouth 2 (two) times daily with a meal.         . Multiple Vitamins-Minerals (MULTIVITAMIN WITH MINERALS)  tablet   Oral   Take 1 tablet by mouth daily.           [name]  nitroGLYCERIN (NITROSTAT) 0.4 MG SL tablet   Sublingual   Place 0.4 mg under the tongue every 5 (five) minutes as needed. For chest pain.         [name] omeprazole (PRILOSEC) 20 MG capsule   Oral   Take 20 mg by mouth daily.           . potassium chloride (K-DUR,KLOR-CON) 10 MEQ tablet   Oral   Take 2 tablets (20 mEq total) by mouth 2 (two) times daily.   30 tablet   0   . sertraline (ZOLOFT) 100 MG tablet   Oral   Take 100 mg by mouth daily.          . traMADol (ULTRAM) 50 MG tablet   Oral   Take 1-2 tablets (50-100 mg total) by mouth every 6 (six) hours as needed for pain.   40 tablet   0   . UNKNOWN TO PATIENT      Med for back pain         . azithromycin (ZITHROMAX) 250 MG tablet   Oral   Take 1 tablet (250 mg total) by mouth daily. Take first 2 tablets together, then 1 every day until finished.   6 tablet   0   . predniSONE (DELTASONE) 20 MG tablet   Oral   Take 2 tablets (40 mg total) by mouth daily.   10 tablet   0     BP 164/84  Pulse 80  Temp(Src) 98.5 F (36.9 C) (Oral)  Resp 19  SpO2 97%  Physical Exam  Nursing note and vitals reviewed. Constitutional: She is oriented to person, place, and time. No distress.  Morbidly obese, comfortable and nontoxic appearing in NAD. Patient on 2L [place] in ED, denies being on oxygen at home  HENT:  Head: Normocephalic and atraumatic.  Mouth/Throat: Oropharynx is clear and moist. No oropharyngeal exudate.  Eyes: Conjunctivae are normal. Pupils are equal, round, and reactive to light. No scleral icterus.  Neck: Normal range of motion. Neck supple.  Cardiovascular: Normal rate, regular rhythm, normal heart sounds and intact distal pulses.   Distal radial, dorsalis pedis, and posterior tibial pulses 2+ bilaterally  Pulmonary/Chest: Breath sounds normal. No respiratory distress. She has no wheezes. She has no rales.  Poor inspiratory effort  Abdominal:  Soft. Bowel sounds are normal. She exhibits no mass. There is no rebound and no guarding.  Musculoskeletal: Normal range of motion. She exhibits no edema.  Lymphadenopathy:    She has no cervical adenopathy.  Neurological: She is alert and oriented to person, place, and time.  DTRs normal and symmetric  Skin: Skin is warm and dry. No rash noted. She is not diaphoretic. No erythema.  Psychiatric: She has a normal mood and affect. Her behavior is normal.    ED Course  Procedures (including critical care time)  Labs Reviewed  CBC WITH DIFFERENTIAL - Abnormal; Notable for the following:    Neutrophils Relative 38 (*)    Eosinophils Relative 13 (*)    Eosinophils Absolute 0.9 (*)    All other components within normal limits  COMPREHENSIVE METABOLIC PANEL - Abnormal; Notable for the following:    Potassium 3.4 (*)    All other components within normal limits  URINALYSIS, ROUTINE W REFLEX MICROSCOPIC - Abnormal; Notable for the following:    Ketones, ur 15 (*)    All other components within normal limits  D-DIMER, QUANTITATIVE - Abnormal; Notable for the following:    D-Dimer, Quant 0.52 (*)  All other components within normal limits  PRO B NATRIURETIC PEPTIDE  TROPONIN I  GLUCOSE, CAPILLARY   Dg Chest 2 View  07/21/2012  *RADIOLOGY REPORT*  Clinical Data: Shortness of breath.  Ex-smoker.  CHEST - 2 VIEW  Comparison: 06/04/2012.  Findings: Normal sized heart.  Clear lungs.  Mild diffuse peribronchial thickening.  Lower thoracic spine degenerative changes and mild scoliosis.  IMPRESSION: Mild chronic bronchitic changes.  No acute abnormality.   Original Report Authenticated By: Beckie Salts, M.D.    Ct Angio Chest Pe W/cm &/or Wo Cm  07/21/2012  *RADIOLOGY REPORT*  Clinical Data: Diarrhea with shortness of breath and chest pressure.  History of congestive heart failure.  History of pulmonary embolism.  Question recurrent pulmonary embolism.  CT ANGIOGRAPHY CHEST  Technique:   Multidetector CT imaging of the chest using the standard protocol during bolus administration of intravenous contrast. Multiplanar reconstructed images including MIPs were obtained and reviewed to evaluate the vascular anatomy.  Contrast: OMNIPAQUE IOHEXOL 350 MG/ML SOLN  Comparison: Chest CTA 04/27/2012.  Findings: The pulmonary arteries are well opacified with contrast. There is no evidence of acute pulmonary embolism.  There is stable central enlargement of the pulmonary arteries.  Anterior mediastinal masses measuring 2.2 x 1.7 cm on image 20 and 2.6 x 2.2 cm on image 25 are stable.  There are no enlarged mediastinal or hilar lymph nodes.  There is a small hiatal hernia.  There is no significant pleural or pericardial effusion.  The left lower lobe air space disease demonstrated on the most recent examination has resolved.  There is mild dependent atelectasis at both lung bases and within the right middle lobe and lingula which appears stable.  The visualized upper abdomen is notable for hepatomegaly and low density consistent with steatosis.  No acute osseous findings are identified.  IMPRESSION:  1.  No evidence of acute pulmonary embolism or other acute chest process. 2.  Interval resolution of left lower lobe air space disease. 3.  Stable anterior mediastinal masses from 2008, consistent with a benign etiology.   Original Report Authenticated By: Carey Bullocks, M.D.     Date: 07/21/2012  Rate: 75  Rhythm: normal sinus rhythm  QRS Axis: normal  Intervals: normal  ST/T Wave abnormalities: nonspecific T wave changes  Conduction Disutrbances:none  Narrative Interpretation: NSR with nonspecific T wave changes, unchanged from prior; no STEMI  Old EKG Reviewed: unchanged I have personally reviewed and interpreted this EKG   1. Bronchitis, acute     MDM  Patient with extensive PMH and gradually worsening SOB x 2 days. Symptoms worse when supine and patient with positive CHF history. Will  work up with CXR, basic labs, CBG, troponin, BNP, and UA. EKG unchanged from prior.  Patient's work up significant, only, for mild hypokalemia of 3.4; mild chronic bronchitic changes observed on CXR. Patient still c/o SOB. Will further evaluate with D-dimer given PE history, though Well's PE score low at 1.5. Albuterol and atrovent breathing treatment ordered.   Patient endorses symptom improvement with nebulizer tx. CT angio ordered as D-dimer elevated. Will continue to monitor. Patient well and nontoxic appearing and in no acute respiratory distress; hemodynamically stable.  Patient's CT angio negative for PE. Patient states symptoms have greatly improved with ED management; satting 96% on room air and hemodynamically stable. Will d/c with PCP follow up as well as azithromycin and prednisone for symptom improvement. Indications for ED return discussed. Patient states comfort and understanding with this d/c plan with  no unaddressed concerns. Patient seen also by Dr. Ignacia Palma who is in agreement with this w/u and management plan.  Filed Vitals:   07/21/12 1812 07/21/12 1830 07/21/12 2015 07/21/12 2025  BP: 164/84 146/96  146/74  Pulse: 80 77 78 79  Temp:      TempSrc:      Resp: 19 18 23 19   SpO2: 97% 96% 93% 94%         Antony Madura, PA-C 07/23/12 1925

## 2012-07-21 NOTE — ED Notes (Signed)
Patient transported to CT 

## 2012-07-21 NOTE — ED Notes (Signed)
[facility] notified of transport to ED.  Truck en route.

## 2012-07-21 NOTE — ED Notes (Signed)
Report called to [facility], ED Charge RN.  Report given to [facility] Tasia Catchings).

## 2012-07-23 NOTE — ED Provider Notes (Signed)
Medical screening examination/treatment/procedure(s) were performed by resident physician or non-physician practitioner and as supervising physician I was immediately available for consultation/collaboration.   KINDL,JAMES DOUGLAS MD.   James D Kindl, MD 07/23/12 2054 

## 2012-07-24 NOTE — ED Provider Notes (Signed)
Medical screening examination/treatment/procedure(s) were conducted as a shared visit with non-physician practitioner(s) and myself.  I personally evaluated the patient during the encounter 52 yo with morbid obesity, recent appendectomy, prior Hx CHF, has had shortness of breath and chest pain for 2 days. Seen at [facility] and sent to [facility] ED for evaluation. Exam shows her to be morbidly obese, ? Rales at bases, heart sounds normal, abdomen large, no peripheral edema. Lab workup ordered. Old charts reviewed.       Carleene Cooper III, MD 07/24/12 2113

## 2012-08-02 ENCOUNTER — Encounter (INDEPENDENT_AMBULATORY_CARE_PROVIDER_SITE_OTHER): Payer: Medicare Other | Admitting: Pulmonary Disease

## 2012-08-02 ENCOUNTER — Ambulatory Visit: Payer: Medicare Other | Admitting: Pulmonary Disease

## 2012-08-02 ENCOUNTER — Encounter: Payer: Self-pay | Admitting: Pulmonary Disease

## 2012-08-02 ENCOUNTER — Ambulatory Visit (INDEPENDENT_AMBULATORY_CARE_PROVIDER_SITE_OTHER): Payer: Medicare Other | Admitting: Pulmonary Disease

## 2012-08-02 VITALS — BP 132/80 | HR 94 | Temp 98.7°F | Ht 60.0 in | Wt 226.0 lb

## 2012-08-02 DIAGNOSIS — R0609 Other forms of dyspnea: Secondary | ICD-10-CM

## 2012-08-02 DIAGNOSIS — D869 Sarcoidosis, unspecified: Secondary | ICD-10-CM

## 2012-08-02 DIAGNOSIS — R0600 Dyspnea, unspecified: Secondary | ICD-10-CM

## 2012-08-02 LAB — PULMONARY FUNCTION TEST

## 2012-08-02 NOTE — Patient Instructions (Addendum)
I can see no pulmonary issue causing your shortness of breath. Would work aggressively on weight loss and conditioning. No followup with me required, and will send a note to your primary physician.

## 2012-08-02 NOTE — Progress Notes (Signed)
  Subjective:    Patient ID: [name], female    DOB: [birth year], 52 y.o.   MRN: [id number]  HPI The patient comes in today for followup of her dyspnea on exertion.  I last saw her approximately one year ago, and asked her to schedule PFTs within 2-3 weeks with an office visit the same day for followup.  I have not seen her since, and she comes in today where she has had her pulmonary function studies.  She continues to have dyspnea on exertion, and feels it may be a little worse than last year.  Her PFTs today show no obstruction, moderate restriction, and a moderate decrease in diffusion capacity that corrects with alveolar volume adjustment.  She has had a recent CT chest shows no parenchymal lung disease or other acute abnormality.   Review of Systems  Constitutional: Negative for fever and unexpected weight change.  HENT: Negative for ear pain, nosebleeds, congestion, sore throat, rhinorrhea, sneezing, trouble swallowing, dental problem, postnasal drip and sinus pressure.   Eyes: Negative for redness and itching.  Respiratory: Positive for cough, shortness of breath and wheezing. Negative for chest tightness.   Cardiovascular: Negative for palpitations and leg swelling.  Gastrointestinal: Negative for nausea and vomiting.  Genitourinary: Negative for dysuria.  Musculoskeletal: Negative for joint swelling.  Skin: Negative for rash.  Neurological: Negative for headaches.  Hematological: Does not bruise/bleed easily.  Psychiatric/Behavioral: Negative for dysphoric mood. The patient is not nervous/anxious.        Objective:   Physical Exam Morbidly obese female in no acute distress Nose without purulence or discharge noted Oropharynx clear Neck without lymphadenopathy or thyromegaly Chest with fairly clear breath sounds, no wheezing  cardiac exam with regular rate and rhythm Lower extremities with mild edema, no cyanosis alert and oriented, moves all 4 extremities.      Assessment & Plan:

## 2012-08-02 NOTE — Assessment & Plan Note (Signed)
The patient has had PFTs today to workup her dyspnea on exertion, and she was found to have no obstruction, moderate restrictions related to her centripetal obesity, and a decrease in her diffusion capacity that does correct with alveolar volume adjustment.  She has had a recent CT chest that showed no pulmonary emboli, no parenchymal lung disease, and only minimal basilar atelectasis related to her small lung volumes.  She did have a tiny anterior mediastinal density that is unchanged over the years, and there is nothing by history to suggest myasthenia gravis.  She has had an echocardiogram the end of last year that showed no pulmonary hypertension, making chronic thromboembolic disease very unlikely.  I really think her primary issue is centripetal obesity with restrictive physiology.  I have asked her to work aggressively on weight loss as well as conditioning.  I have also reviewed the various other causes of shortness of breath, including cardiac disease and also miscellaneous factor such as anemia and hypothyroidism.  She does not require pulmonary f/u at this time.

## 2012-10-28 ENCOUNTER — Emergency Department (HOSPITAL_COMMUNITY): Payer: PRIVATE HEALTH INSURANCE

## 2012-10-28 ENCOUNTER — Encounter (HOSPITAL_COMMUNITY): Payer: Self-pay | Admitting: *Deleted

## 2012-10-28 ENCOUNTER — Inpatient Hospital Stay (HOSPITAL_COMMUNITY)
Admission: EM | Admit: 2012-10-28 | Discharge: 2012-10-30 | DRG: 292 | Disposition: A | Discharge status: Home / Self Care | Payer: PRIVATE HEALTH INSURANCE | Attending: Internal Medicine | Admitting: Internal Medicine

## 2012-10-28 DIAGNOSIS — G4733 Obstructive sleep apnea (adult) (pediatric): Secondary | ICD-10-CM | POA: Diagnosis present

## 2012-10-28 DIAGNOSIS — E876 Hypokalemia: Secondary | ICD-10-CM | POA: Diagnosis present

## 2012-10-28 DIAGNOSIS — I5032 Chronic diastolic (congestive) heart failure: Secondary | ICD-10-CM | POA: Diagnosis present

## 2012-10-28 DIAGNOSIS — Z880 Allergy status to penicillin: Secondary | ICD-10-CM

## 2012-10-28 DIAGNOSIS — E662 Morbid (severe) obesity with alveolar hypoventilation: Secondary | ICD-10-CM | POA: Diagnosis present

## 2012-10-28 DIAGNOSIS — E669 Obesity, unspecified: Secondary | ICD-10-CM

## 2012-10-28 DIAGNOSIS — Z87891 Personal history of nicotine dependence: Secondary | ICD-10-CM

## 2012-10-28 DIAGNOSIS — IMO0001 Reserved for inherently not codable concepts without codable children: Secondary | ICD-10-CM | POA: Diagnosis present

## 2012-10-28 DIAGNOSIS — J9859 Other diseases of mediastinum, not elsewhere classified: Secondary | ICD-10-CM | POA: Diagnosis present

## 2012-10-28 DIAGNOSIS — D150 Benign neoplasm of thymus: Secondary | ICD-10-CM | POA: Diagnosis present

## 2012-10-28 DIAGNOSIS — Z6841 Body Mass Index (BMI) 40.0 and over, adult: Secondary | ICD-10-CM

## 2012-10-28 DIAGNOSIS — Z85830 Personal history of malignant neoplasm of bone: Secondary | ICD-10-CM

## 2012-10-28 DIAGNOSIS — F329 Major depressive disorder, single episode, unspecified: Secondary | ICD-10-CM | POA: Diagnosis present

## 2012-10-28 DIAGNOSIS — Z86711 Personal history of pulmonary embolism: Secondary | ICD-10-CM

## 2012-10-28 DIAGNOSIS — I5030 Unspecified diastolic (congestive) heart failure: Secondary | ICD-10-CM | POA: Diagnosis present

## 2012-10-28 DIAGNOSIS — I10 Essential (primary) hypertension: Secondary | ICD-10-CM | POA: Diagnosis present

## 2012-10-28 DIAGNOSIS — Z882 Allergy status to sulfonamides status: Secondary | ICD-10-CM

## 2012-10-28 DIAGNOSIS — E785 Hyperlipidemia, unspecified: Secondary | ICD-10-CM | POA: Diagnosis present

## 2012-10-28 DIAGNOSIS — J45909 Unspecified asthma, uncomplicated: Secondary | ICD-10-CM | POA: Diagnosis present

## 2012-10-28 DIAGNOSIS — R079 Chest pain, unspecified: Secondary | ICD-10-CM | POA: Diagnosis present

## 2012-10-28 DIAGNOSIS — Z885 Allergy status to narcotic agent status: Secondary | ICD-10-CM

## 2012-10-28 DIAGNOSIS — F411 Generalized anxiety disorder: Secondary | ICD-10-CM | POA: Diagnosis present

## 2012-10-28 DIAGNOSIS — F3289 Other specified depressive episodes: Secondary | ICD-10-CM | POA: Diagnosis present

## 2012-10-28 DIAGNOSIS — R0602 Shortness of breath: Secondary | ICD-10-CM | POA: Diagnosis present

## 2012-10-28 DIAGNOSIS — Z79899 Other long term (current) drug therapy: Secondary | ICD-10-CM

## 2012-10-28 DIAGNOSIS — I5033 Acute on chronic diastolic (congestive) heart failure: Principal | ICD-10-CM | POA: Diagnosis present

## 2012-10-28 DIAGNOSIS — M25519 Pain in unspecified shoulder: Secondary | ICD-10-CM | POA: Diagnosis present

## 2012-10-28 DIAGNOSIS — I509 Heart failure, unspecified: Secondary | ICD-10-CM | POA: Diagnosis present

## 2012-10-28 HISTORY — DX: Malignant (primary) neoplasm, unspecified: C80.1

## 2012-10-28 HISTORY — DX: Malignant neoplasm of bone and articular cartilage, unspecified: C41.9

## 2012-10-28 LAB — POCT I-STAT 3, ART BLOOD GAS (G3+)
Acid-Base Excess: 4 mmol/L — ABNORMAL HIGH (ref 0.0–2.0)
Bicarbonate: 30 mEq/L — ABNORMAL HIGH (ref 20.0–24.0)
O2 Saturation: 98 %
Patient temperature: 98
TCO2: 31 mmol/L (ref 0–100)
pCO2 arterial: 49.9 mmHg — ABNORMAL HIGH (ref 35.0–45.0)
pH, Arterial: 7.385 (ref 7.350–7.450)
pO2, Arterial: 99 mmHg (ref 80.0–100.0)

## 2012-10-28 LAB — BASIC METABOLIC PANEL
BUN: 13 mg/dL (ref 6–23)
CO2: 30 mEq/L (ref 19–32)
Calcium: 9 mg/dL (ref 8.4–10.5)
Chloride: 103 mEq/L (ref 96–112)
Creatinine, Ser: 0.98 mg/dL (ref 0.50–1.10)
GFR calc Af Amer: 75 mL/min — ABNORMAL LOW (ref >90)
GFR calc non Af Amer: 65 mL/min — ABNORMAL LOW (ref >90)
Glucose, Bld: 93 mg/dL (ref 70–99)
Potassium: 3.6 mEq/L (ref 3.5–5.1)
Sodium: 142 mEq/L (ref 135–145)

## 2012-10-28 LAB — CBC
HCT: 39 % (ref 36.0–46.0)
Hemoglobin: 13 g/dL (ref 12.0–15.0)
MCH: 28.4 pg (ref 26.0–34.0)
MCHC: 33.3 g/dL (ref 30.0–36.0)
MCV: 85.2 fL (ref 78.0–100.0)
Platelets: 244 10*3/uL (ref 150–400)
RBC: 4.58 MIL/uL (ref 3.87–5.11)
RDW: 13.7 % (ref 11.5–15.5)
WBC: 7.2 10*3/uL (ref 4.0–10.5)

## 2012-10-28 LAB — POCT I-STAT TROPONIN I: Troponin i, poc: 0.01 ng/mL (ref 0.00–0.08)

## 2012-10-28 LAB — GLUCOSE, CAPILLARY: Glucose-Capillary: 106 mg/dL — ABNORMAL HIGH (ref 70–99)

## 2012-10-28 LAB — PRO B NATRIURETIC PEPTIDE: Pro B Natriuretic peptide (BNP): 104.1 pg/mL (ref 0–125)

## 2012-10-28 MED ORDER — ALBUTEROL SULFATE (5 MG/ML) 0.5% IN NEBU: 5.0000 mg | INHALATION_SOLUTION | Freq: Once | RESPIRATORY_TRACT | Status: DC

## 2012-10-28 MED ORDER — ALBUTEROL SULFATE HFA 108 (90 BASE) MCG/ACT IN AERS
2.0000 | INHALATION_SPRAY | RESPIRATORY_TRACT | Status: DC | PRN
  Administered 2012-10-29: 2 via RESPIRATORY_TRACT
  Filled 2012-10-28: qty 6.7

## 2012-10-28 MED ORDER — FLUTICASONE PROPIONATE HFA 44 MCG/ACT IN AERO
1.0000 | INHALATION_SPRAY | Freq: Two times a day (BID) | RESPIRATORY_TRACT | Status: DC
  Administered 2012-10-29 – 2012-10-30 (×2): 1 via RESPIRATORY_TRACT
  Filled 2012-10-28 (×2): qty 10.6

## 2012-10-28 MED ORDER — ACETAMINOPHEN 650 MG RE SUPP: 650.0000 mg | Freq: Four times a day (QID) | RECTAL | Status: DC | PRN

## 2012-10-28 MED ORDER — POTASSIUM CHLORIDE CRYS ER 20 MEQ PO TBCR
20.0000 meq | EXTENDED_RELEASE_TABLET | Freq: Two times a day (BID) | ORAL | Status: DC
  Administered 2012-10-28 – 2012-10-29 (×3): 20 meq via ORAL
  Filled 2012-10-28 (×4): qty 1
  Filled 2012-10-28: qty 2
  Filled 2012-10-28: qty 1

## 2012-10-28 MED ORDER — PANTOPRAZOLE SODIUM 40 MG PO TBEC
40.0000 mg | DELAYED_RELEASE_TABLET | Freq: Every day | ORAL | Status: DC
  Administered 2012-10-29 – 2012-10-30 (×2): 40 mg via ORAL
  Filled 2012-10-28 (×2): qty 1

## 2012-10-28 MED ORDER — ACETAMINOPHEN 325 MG PO TABS: 650.0000 mg | ORAL_TABLET | Freq: Four times a day (QID) | ORAL | Status: DC | PRN

## 2012-10-28 MED ORDER — ASPIRIN 81 MG PO CHEW
81.0000 mg | CHEWABLE_TABLET | Freq: Every day | ORAL | Status: DC
  Administered 2012-10-28 – 2012-10-29 (×2): 81 mg via ORAL
  Filled 2012-10-28 (×2): qty 1

## 2012-10-28 MED ORDER — FUROSEMIDE 10 MG/ML IJ SOLN
40.0000 mg | Freq: Two times a day (BID) | INTRAMUSCULAR | Status: DC
  Administered 2012-10-29: 40 mg via INTRAVENOUS
  Filled 2012-10-28 (×3): qty 4

## 2012-10-28 MED ORDER — CARVEDILOL PHOSPHATE ER 10 MG PO CP24
10.0000 mg | ORAL_CAPSULE | Freq: Every day | ORAL | Status: DC
  Administered 2012-10-29 – 2012-10-30 (×2): 10 mg via ORAL
  Filled 2012-10-28 (×2): qty 1

## 2012-10-28 MED ORDER — IPRATROPIUM BROMIDE 0.02 % IN SOLN: 0.5000 mg | Freq: Once | RESPIRATORY_TRACT | Status: DC

## 2012-10-28 MED ORDER — SODIUM CHLORIDE 0.9 % IJ SOLN
3.0000 mL | Freq: Two times a day (BID) | INTRAMUSCULAR | Status: DC
  Administered 2012-10-28 – 2012-10-29 (×3): 3 mL via INTRAVENOUS

## 2012-10-28 MED ORDER — ONDANSETRON HCL 4 MG PO TABS: 4.0000 mg | ORAL_TABLET | Freq: Four times a day (QID) | ORAL | Status: DC | PRN

## 2012-10-28 MED ORDER — LORATADINE 10 MG PO TABS
10.0000 mg | ORAL_TABLET | Freq: Every day | ORAL | Status: DC
  Administered 2012-10-29 – 2012-10-30 (×2): 10 mg via ORAL
  Filled 2012-10-28 (×2): qty 1

## 2012-10-28 MED ORDER — LORAZEPAM 0.5 MG PO TABS: 0.5000 mg | ORAL_TABLET | Freq: Three times a day (TID) | ORAL | Status: DC | PRN

## 2012-10-28 MED ORDER — SERTRALINE HCL 100 MG PO TABS
100.0000 mg | ORAL_TABLET | Freq: Every day | ORAL | Status: DC
  Administered 2012-10-29 – 2012-10-30 (×2): 100 mg via ORAL
  Filled 2012-10-28 (×2): qty 1

## 2012-10-28 MED ORDER — ONDANSETRON HCL 4 MG/2ML IJ SOLN: 4.0000 mg | Freq: Four times a day (QID) | INTRAMUSCULAR | Status: DC | PRN

## 2012-10-28 MED ORDER — NITROGLYCERIN 0.4 MG SL SUBL: 0.4000 mg | SUBLINGUAL_TABLET | SUBLINGUAL | Status: DC | PRN

## 2012-10-28 MED ORDER — SODIUM CHLORIDE 0.9 % IV SOLN: 250.0000 mL | INTRAVENOUS | Status: DC | PRN

## 2012-10-28 MED ORDER — INSULIN ASPART 100 UNIT/ML ~~LOC~~ SOLN: 0.0000 [IU] | Freq: Every day | SUBCUTANEOUS | Status: DC

## 2012-10-28 MED ORDER — SODIUM CHLORIDE 0.9 % IJ SOLN: 3.0000 mL | INTRAMUSCULAR | Status: DC | PRN

## 2012-10-28 MED ORDER — ATORVASTATIN CALCIUM 40 MG PO TABS
40.0000 mg | ORAL_TABLET | Freq: Every day | ORAL | Status: DC
  Administered 2012-10-28 – 2012-10-29 (×2): 40 mg via ORAL
  Filled 2012-10-28 (×3): qty 1

## 2012-10-28 MED ORDER — IOHEXOL 350 MG/ML SOLN
100.0000 mL | Freq: Once | INTRAVENOUS | Status: AC | PRN
  Administered 2012-10-28: 100 mL via INTRAVENOUS

## 2012-10-28 MED ORDER — ALBUTEROL SULFATE (5 MG/ML) 0.5% IN NEBU: 2.5000 mg | INHALATION_SOLUTION | RESPIRATORY_TRACT | Status: DC | PRN

## 2012-10-28 MED ORDER — MORPHINE SULFATE 4 MG/ML IJ SOLN
4.0000 mg | Freq: Once | INTRAMUSCULAR | Status: AC
  Administered 2012-10-28: 4 mg via INTRAVENOUS
  Filled 2012-10-28: qty 1

## 2012-10-28 MED ORDER — INSULIN ASPART 100 UNIT/ML ~~LOC~~ SOLN
0.0000 [IU] | Freq: Three times a day (TID) | SUBCUTANEOUS | Status: DC
  Administered 2012-10-29: 2 [IU] via SUBCUTANEOUS

## 2012-10-28 MED ORDER — ALBUTEROL 90 MCG/ACT IN AERS
2.0000 | INHALATION_SPRAY | RESPIRATORY_TRACT | Status: DC | PRN
Start: 2012-10-28 — End: 2012-10-28

## 2012-10-28 MED ORDER — LISINOPRIL 20 MG PO TABS
20.0000 mg | ORAL_TABLET | Freq: Every day | ORAL | Status: DC
  Administered 2012-10-29 – 2012-10-30 (×2): 20 mg via ORAL
  Filled 2012-10-28 (×2): qty 1

## 2012-10-28 MED ORDER — ENOXAPARIN SODIUM 40 MG/0.4ML ~~LOC~~ SOLN
40.0000 mg | SUBCUTANEOUS | Status: DC
  Administered 2012-10-28 – 2012-10-29 (×2): 40 mg via SUBCUTANEOUS
  Filled 2012-10-28 (×3): qty 0.4

## 2012-10-28 MED ORDER — ISOSORBIDE DINITRATE 20 MG PO TABS
20.0000 mg | ORAL_TABLET | Freq: Two times a day (BID) | ORAL | Status: DC
  Administered 2012-10-28 – 2012-10-30 (×4): 20 mg via ORAL
  Filled 2012-10-28 (×5): qty 1

## 2012-10-28 MED ORDER — FUROSEMIDE 10 MG/ML IJ SOLN
40.0000 mg | Freq: Once | INTRAMUSCULAR | Status: AC
  Administered 2012-10-28: 40 mg via INTRAVENOUS
  Filled 2012-10-28: qty 4

## 2012-10-28 MED ORDER — MORPHINE SULFATE 2 MG/ML IJ SOLN
1.0000 mg | INTRAMUSCULAR | Status: DC | PRN
  Administered 2012-10-28 – 2012-10-30 (×6): 1 mg via INTRAVENOUS
  Filled 2012-10-28 (×7): qty 1

## 2012-10-28 NOTE — ED Notes (Addendum)
Pt in c/o chest pain, shortness of breath and headache since Thursday night, pt states she thinks she is "full of fluid", pt states shortness of breath is worse with exertion, decreased energy, states pain radiates into her back. Pt states she has noted increased swelling in her ankles, also swelling in her hands over the last few days.

## 2012-10-28 NOTE — ED Notes (Signed)
Pt taken to radiology via transporter, Alphonzo [place]

## 2012-10-28 NOTE — ED Provider Notes (Signed)
History    CSN: [id number] Arrival date & time 10/28/12  1703  First MD Initiated Contact with Patient 10/28/12 1756     Chief Complaint  Patient presents with  . Chest Pain   (Consider location/radiation/quality/duration/timing/severity/associated sxs/prior Treatment) HPI Comments: 52 year old obese female with a past medical history of hypertension, diabetes, CHF, PE and asthma presents to the emergency department complaining of increasing shortness of breath, chest pain and headache beginning 5 days ago on Thursday night. Patient states she feels "full of fluid". Shortness of breath is worse on exertion, however present all the time. States she weighs herself daily, was down 10 pounds about one week ago, and now her weight is only down 1 pound. Admits to increased water intake and lack of use of CPAP machine at night. Her ankles tend to swell towards the end of the day, feels as if her hands are swollen. Admits to decreased energy. Chest pain is located in the Center of her chest radiating to both sides and her back, described as sharp and heavy rated 10 out of 10. She has not tried any alleviating factors for her pain. Last saw her cardiologist back in February.denies nausea, vomiting, abdominal pain, fever, chills or cough.  Patient is a 52 y.o. female presenting with chest pain. The history is provided by the patient.  Chest Pain Associated symptoms: fatigue, headache, shortness of breath and weakness   Associated symptoms: no abdominal pain, no back pain, no cough, no fever, no nausea and not vomiting    Past Medical History  Diagnosis Date  . Hypertension   . Diabetes mellitus   . Asthma   . Anginal pain     a. NL cath in 2008;  b. Myoview 03/2011: dec uptake along mid anterior wall on stress imaging -> ? attenuation vs. ischemia, EF 65%;  c. Echo 04/2011: EF 55-60%, no RWMA, Gr 2 dd  . Depression   . Anxiety   . Pulmonary embolism     a. 2008 -> coumadin x 6 mos.  . Sleep apnea    . 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 Obesity   . CHF (congestive heart failure)   . CHF (congestive heart failure)   . Pulmonary edema    Past Surgical History  Procedure Laterality Date  . Abdominal hysterectomy  2005  . Leg surgery    . Tubal ligation  1989  . Left knee surgery  2008  . Laparoscopic appendectomy N/A 06/03/2012    Procedure: APPENDECTOMY LAPAROSCOPIC;  Surgeon: Almond Lint, MD;  Location: [facility] OR;  Service: General;  Laterality: N/A;  . Cardiac catheterization      Normal   Family History  Problem Relation Age of Onset  . Emphysema Mother   . Arthritis Mother   . Heart failure Mother     alive @ 15  . Asthma Brother   . Heart disease Paternal Grandfather   . Heart disease Father     died @ 32's.  . Stroke Father   . Colon cancer Maternal Grandfather    History  Substance Use Topics  . Smoking status: Former Smoker -- 0.50 packs/day for 15 years    Types: Cigarettes  . Smokeless tobacco: Not on file  . Alcohol Use: No   OB History   Grav Para Term Preterm Abortions TAB SAB Ect Mult Living  Review of Systems  Constitutional: Positive for fatigue and unexpected weight change. Negative for fever and chills.  Respiratory: Positive for shortness of breath. Negative for cough.   Cardiovascular: Positive for chest pain and leg swelling.  Gastrointestinal: Negative for nausea, vomiting and abdominal pain.  Musculoskeletal: Negative for back pain.  Neurological: Positive for weakness and headaches.  All other systems reviewed and are negative.    Allergies  Hydrocodone; Penicillins; and Sulfa antibiotics  Home Medications   Current Outpatient Rx  Name  Route  Sig  Dispense  Refill  . albuterol (PROVENTIL,VENTOLIN) 90 MCG/ACT inhaler   Inhalation   Inhale 2 puffs into the lungs every 4 (four) hours as needed for shortness of breath.          [name] aspirin 81 MG tablet   Oral   Take 81 mg by mouth daily.            [name] atorvastatin (LIPITOR) 40 MG tablet   Oral   Take 40 mg by mouth at bedtime.          . beclomethasone (QVAR) 80 MCG/ACT inhaler   Inhalation   Inhale 1 puff into the lungs 2 (two) times daily.         . carvedilol (COREG CR) 10 MG 24 hr capsule   Oral   Take 1 capsule (10 mg total) by mouth daily.   60 capsule   3   . furosemide (LASIX) 40 MG tablet   Oral   Take 40 mg by mouth daily.         [name] ibuprofen (ADVIL,MOTRIN) 200 MG tablet   Oral   Take 200 mg by mouth every 6 (six) hours as needed. For pain         . isosorbide dinitrate (ISORDIL) 20 MG tablet   Oral   Take 20 mg by mouth 2 (two) times daily.          [name] lisinopril (PRINIVIL,ZESTRIL) 20 MG tablet   Oral   Take 20 mg by mouth daily.          [name] loratadine (CLARITIN) 10 MG tablet   Oral   Take 10 mg by mouth daily.         . metFORMIN (GLUCOPHAGE) 1000 MG tablet   Oral   Take 1,000 mg by mouth 2 (two) times daily with a meal.         . omeprazole (PRILOSEC) 20 MG capsule   Oral   Take 20 mg by mouth daily.           . potassium chloride (K-DUR,KLOR-CON) 10 MEQ tablet   Oral   Take 2 tablets (20 mEq total) by mouth 2 (two) times daily.   30 tablet   0   . sertraline (ZOLOFT) 100 MG tablet   Oral   Take 100 mg by mouth daily.          . nitroGLYCERIN (NITROSTAT) 0.4 MG SL tablet   Sublingual   Place 0.4 mg under the tongue every 5 (five) minutes as needed. For chest pain.          BP 154/93  Pulse 76  Temp(Src) 98 F (36.7 C) (Oral)  Resp 26  SpO2 94% Physical Exam  Nursing note and vitals reviewed. Constitutional: She is oriented to person, place, and time. She appears well-developed. No distress.  Obese  HENT:  Head: Normocephalic and atraumatic.  Mouth/Throat: Oropharynx is clear and moist.  Eyes: Conjunctivae and EOM are  normal. Pupils are equal, round, and reactive to light. No scleral icterus.  Neck: Normal range of motion. Neck supple. No tracheal  deviation present.  Cardiovascular: Normal rate, regular rhythm, normal heart sounds and intact distal pulses.   Pulmonary/Chest: Tachypnea noted. No respiratory distress. She has no wheezes. She has no rhonchi. She has no rales.  Poor air movement.  Abdominal: Soft. Bowel sounds are normal. She exhibits no distension. There is no tenderness.  Musculoskeletal: Normal range of motion. She exhibits no edema.  Neurological: She is alert and oriented to person, place, and time. She has normal strength. No sensory deficit.  Skin: Skin is warm and dry. She is not diaphoretic.  Psychiatric: She has a normal mood and affect. Her behavior is normal.    ED Course  Procedures (including critical care time) Labs Reviewed  BASIC METABOLIC PANEL - Abnormal; Notable for the following:    GFR calc non Af Amer 65 (*)    GFR calc Af Amer 75 (*)    All other components within normal limits  CBC  PRO B NATRIURETIC PEPTIDE  POCT I-STAT TROPONIN I    Date: 10/28/2012  Rate: 81  Rhythm: normal sinus rhythm  QRS Axis: normal  Intervals: normal  ST/T Wave abnormalities: normal  Conduction Disutrbances:none  Narrative Interpretation: no stemi, normal EKG  Old EKG Reviewed: unchanged  Dg Chest 2 View  10/28/2012   Chest pain.  *RADIOLOGY REPORT*  Clinical Data:  Chest pain  CHEST - 2 VIEW  Comparison: 07/21/2012  Findings: Cardiomegaly with vascular congestion and bilateral interstitial and alveolar opacities, likely mild edema.  Recommend clinical correlation to exclude infection.  No visible effusions. Mild hyperinflation of the lungs.  No acute bony abnormality.  IMPRESSION: Suspect mild edema/CHF.  Recommend clinical correlation to exclude infection.   Original Report Authenticated By: Charlett Nose, M.D.   Ct Angio Chest Pe W/cm &/or Wo Cm  10/28/2012   *RADIOLOGY REPORT*  Clinical Data: Chest pain, shortness of breath.  CT ANGIOGRAPHY CHEST  Technique:  Multidetector CT imaging of the chest using the  standard protocol during bolus administration of intravenous contrast. Multiplanar reconstructed images including MIPs were obtained and reviewed to evaluate the vascular anatomy.  Contrast: OMNIPAQUE IOHEXOL 350 MG/ML SOLN  Comparison: 07/21/2012  Findings: No filling defects in the pulmonary arteries to suggest pulmonary emboli.  Heart is borderline in size.  Aorta is normal caliber.  No evidence of dissection.  Anterior mediastinal masses are again noted superiorly.  These are unchanged.  The largest measures 2.6 x 2.1 cm on image 30.  No other adenopathy.  Small scattered axillary lymph nodes, not pathologically enlarged.  No pleural effusions.  Low lung volumes.  No confluent airspace opacities.  Imaging into the upper abdomen shows no acute findings.  No acute bony abnormality.  IMPRESSION: No evidence of pulmonary embolus.  Stable anterior mediastinal masses dating back to 2008.  Findings compatible with benign process.  No acute findings.   Original Report Authenticated By: Charlett Nose, M.D.   1. CHF exacerbation     MDM  Patient with sob, chest pain. Hx of CHF and PE. Poor air movement on lung examination, patient with large body habitus. Labs obtained in triage prior to patient being seen which are unremarkable. Troponin negative. BNP 104.1. Awaiting chest x-ray. Will also obtain CT angiogram to rule out pulmonary embolism as the patient has a history of PE, no longer on anticoagulation, over age 30 and O2 sat 94%. 8:15  PM CT negative for PE. CXR showing pulmonary edema/CHF. O2 sat 97% on RA. Patient will be admitted for CHF exacerbation. Case discussed with Dr. Jeraldine Loots who agrees with plan of care. I spoke with Dr. Timothy Lasso with [facility] who will admit patient.   Trevor Mace, PA-C 10/28/12 2043

## 2012-10-28 NOTE — H&P (Signed)
[name] is an 52 y.o. female.   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  She was treated in January 2008 with Coumadin for 6 months for a question of pulmonary embolus on CT. Last 2D ECHO was EF 55-60% c mild LVH and known prior grade 2 diastolic Dysfunction.  She reports recent 10 Pound weight gain.  Excessive thirst and Polydipsia.  Smothering at night.  DOE, Orthopnea and B atypical CPs and back pains and shoulder pains and tightness in B feet.  Sxs got worse @ Thursday.   + Cough.  She shows no current edema but c/o dependent edema.    She did well with laparoscopic appendectomy  on 06/04/12 by Dr. Donell Beers.  She was admitted 12/2011 for Chest pains and Hyotension/Syncope: Cardiology was consulted, review patient had a normal cardiac cath in 2008 and a normal Myoview stress test in December 2012. 2-D echocardiogram was obtained which showed EF of 55-60% and normal wall motion with no regional wall motion abnormalities. Per cardiology no further cardiac evaluation is warranted and they do not feel that this was a cardiac chest pain. It was suspected that possibly due to her diastolic dysfunction is resulting in excessive cardiac strain, patient was placed on Lasix and beta blocker.   She was given IV Lasix in ED.  EKG and Trop I were (-).  BNP was @ 100.  CXR and CTPA were (-) for PE or PNA.  Interstitial Edema was seen.  I was called for inpt Admission The last time she saw her cardiologist was 12/2011.   Currently living with mother as she has no other place to be.   Past Medical History:  Past Medical History  Diagnosis Date  . Hypertension   . Diabetes mellitus   . Asthma   . Anginal pain     a. NL  cath in 2008;  b. Myoview 03/2011: dec uptake along mid anterior wall on stress imaging -> ? attenuation vs. ischemia, EF 65%;  c. Echo 04/2011: EF 55-60%, no RWMA, Gr 2 dd  . Depression   . Anxiety   . Pulmonary embolism     a. 2008 -> coumadin x 6 mos.  . Sleep apnea   . 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 Obesity   . CHF (congestive heart failure)   . CHF (congestive heart failure)   . Pulmonary edema   . Cancer   . Bone cancer     Past Surgical History  Procedure Laterality Date  . Abdominal hysterectomy  2005  . Leg surgery    . Tubal ligation  1989  . Left knee surgery  2008  . Laparoscopic appendectomy N/A 06/03/2012    Procedure: APPENDECTOMY LAPAROSCOPIC;  Surgeon: Almond Lint, MD;  Location: [facility] OR;  Service: General;  Laterality: N/A;  . Cardiac catheterization      Normal    laparoscopic appendectomy with on 06/04/12 by Dr. Donell Beers.   Allergies:   Allergies  Allergen Reactions  . Hydrocodone Other (See Comments)    Causes Hallucinations  . Penicillins Swelling  . Sulfa Antibiotics Itching and Rash  Medications: Prior to Admission medications   Medication Sig Start Date End Date Taking? Authorizing Provider  albuterol (PROVENTIL,VENTOLIN) 90 MCG/ACT inhaler Inhale 2 puffs into the lungs every 4 (four) hours as needed for shortness of breath.    Yes Historical Provider, MD  aspirin 81 MG tablet Take 81 mg by mouth daily.     Yes Historical Provider, MD  atorvastatin (LIPITOR) 40 MG tablet Take 40 mg by mouth at bedtime.    Yes Historical Provider, MD  beclomethasone (QVAR) 80 MCG/ACT inhaler Inhale 1 puff into the lungs 2 (two) times daily.   Yes Historical Provider, MD  carvedilol (COREG CR) 10 MG 24 hr capsule Take 1 capsule (10 mg total) by mouth daily. 01/11/12  Yes Ripudeep Jenna Luo, MD  furosemide (LASIX) 40 MG tablet Take 40 mg by mouth daily.   Yes Ripudeep Jenna Luo, MD  ibuprofen (ADVIL,MOTRIN) 200 MG tablet Take 200 mg by mouth every  6 (six) hours as needed. For pain   Yes Historical Provider, MD  isosorbide dinitrate (ISORDIL) 20 MG tablet Take 20 mg by mouth 2 (two) times daily.    Yes Historical Provider, MD  lisinopril (PRINIVIL,ZESTRIL) 20 MG tablet Take 20 mg by mouth daily.    Yes Historical Provider, MD  loratadine (CLARITIN) 10 MG tablet Take 10 mg by mouth daily.   Yes Historical Provider, MD  metFORMIN (GLUCOPHAGE) 1000 MG tablet Take 1,000 mg by mouth 2 (two) times daily with a meal.   Yes Historical Provider, MD  omeprazole (PRILOSEC) 20 MG capsule Take 20 mg by mouth daily.     Yes Historical Provider, MD  potassium chloride (K-DUR,KLOR-CON) 10 MEQ tablet Take 2 tablets (20 mEq total) by mouth 2 (two) times daily. 01/24/12  Yes Olivia Mackie, MD  sertraline (ZOLOFT) 100 MG tablet Take 100 mg by mouth daily.    Yes Historical Provider, MD  nitroGLYCERIN (NITROSTAT) 0.4 MG SL tablet Place 0.4 mg under the tongue every 5 (five) minutes as needed. For chest pain.    Historical Provider, MD      (Not in a hospital admission)   Social History:  reports that she has quit smoking. Her smoking use included Cigarettes. She has a 7.5 pack-year smoking history. She does not have any smokeless tobacco history on file. She reports that she does not drink alcohol or use illicit drugs.  Family History: Family History  Problem Relation Age of Onset  . Emphysema Mother   . Arthritis Mother   . Heart failure Mother     alive @ 32  . Asthma Brother   . Heart disease Paternal Grandfather   . Heart disease Father     died @ 67's.  . Stroke Father   . Colon cancer Maternal Grandfather     Review of Systems:  Review of Systems - See HPI. Full ROS obtained Polyuria, Polydipsia. No Bowel issues. Generalized weakness.  Physical Exam:  Blood pressure 164/85, pulse 69, temperature 98 F (36.7 C), temperature source Oral, resp. rate 15, SpO2 99.00%. Filed Vitals:   10/28/12 1815 10/28/12 2015 10/28/12 2030 10/28/12 2100   BP: 155/75 150/71 146/83 164/85  Pulse: 74 75 75 69  Temp:      TempSrc:      Resp: 21 16 18 15   SpO2: 97% 98% 98% 99%   General appearance: Alert and orinted Head: Normocephalic, without obvious abnormality, atraumatic Eyes: conjunctivae/corneas clear. PERRL, EOM's intact.  Nose: Nares normal. Septum midline. Mucosa normal. No drainage  or sinus tenderness. Throat: lips, mucosa, and tongue normal; teeth and gums normal Neck: Thick neck.  Changes c/w ancanthosis nigricans mild JVD.  No adenopathy, no carotid bruit,  thyroid not enlarged, symmetric, no tenderness/mass/nodules Resp: Distant Cardio: Reg GI: Obese, soft, non-tender; bowel sounds normal; no masses,  no organomegaly Extremities: extremities normal, atraumatic, no cyanosis or edema Pulses: 2+ and symmetric Lymph nodes:  no cervical lymphadenopathy Neurologic: Alert and oriented X 3, normal strength and tone. Normal symmetric reflexes.     Labs on Admission:   Recent Labs  10/28/12 1714  NA 142  K 3.6  CL 103  CO2 30  GLUCOSE 93  BUN 13  CREATININE 0.98  CALCIUM 9.0   No results found for this basename: AST, ALT, ALKPHOS, BILITOT, PROT, ALBUMIN,  in the last 72 hours No results found for this basename: LIPASE, AMYLASE,  in the last 72 hours  Recent Labs  10/28/12 1714  WBC 7.2  HGB 13.0  HCT 39.0  MCV 85.2  PLT 244   No results found for this basename: CKTOTAL, CKMB, CKMBINDEX, TROPONINI,  in the last 72 hours Lab Results  Component Value Date   INR 1.00 06/03/2012   INR 0.95 01/08/2012   INR 0.91 04/23/2011     LAB RESULT POCT:  Results for orders placed during the hospital encounter of 10/28/12  CBC      Result Value Range   WBC 7.2  4.0 - 10.5 K/uL   RBC 4.58  3.87 - 5.11 MIL/uL   Hemoglobin 13.0  12.0 - 15.0 g/dL   HCT 96.0  45.4 - 09.8 %   MCV 85.2  78.0 - 100.0 fL   MCH 28.4  26.0 - 34.0 pg   MCHC 33.3  30.0 - 36.0 g/dL   RDW 11.9  14.7 - 82.9 %   Platelets 244  150 - 400 K/uL   BASIC METABOLIC PANEL      Result Value Range   Sodium 142  135 - 145 mEq/L   Potassium 3.6  3.5 - 5.1 mEq/L   Chloride 103  96 - 112 mEq/L   CO2 30  19 - 32 mEq/L   Glucose, Bld 93  70 - 99 mg/dL   BUN 13  6 - 23 mg/dL   Creatinine, Ser 5.62  0.50 - 1.10 mg/dL   Calcium 9.0  8.4 - 13.0 mg/dL   GFR calc non Af Amer 65 (*) >90 mL/min   GFR calc Af Amer 75 (*) >90 mL/min  PRO B NATRIURETIC PEPTIDE      Result Value Range   Pro B Natriuretic peptide (BNP) 104.1  0 - 125 pg/mL  POCT I-STAT TROPONIN I      Result Value Range   Troponin i, poc 0.01  0.00 - 0.08 ng/mL   Comment 3               Radiological Exams on Admission: Dg Chest 2 View  10/28/2012   Chest pain.  *RADIOLOGY REPORT*  Clinical Data:  Chest pain  CHEST - 2 VIEW  Comparison: 07/21/2012  Findings: Cardiomegaly with vascular congestion and bilateral interstitial and alveolar opacities, likely mild edema.  Recommend clinical correlation to exclude infection.  No visible effusions. Mild hyperinflation of the lungs.  No acute bony abnormality.  IMPRESSION: Suspect mild edema/CHF.  Recommend clinical correlation to exclude infection.   Original Report Authenticated By: Charlett Nose, M.D.   Ct Angio Chest Pe W/cm &/or Wo Cm  10/28/2012   *  RADIOLOGY REPORT*  Clinical Data: Chest pain, shortness of breath.  CT ANGIOGRAPHY CHEST  Technique:  Multidetector CT imaging of the chest using the standard protocol during bolus administration of intravenous contrast. Multiplanar reconstructed images including MIPs were obtained and reviewed to evaluate the vascular anatomy.  Contrast: OMNIPAQUE IOHEXOL 350 MG/ML SOLN  Comparison: 07/21/2012  Findings: No filling defects in the pulmonary arteries to suggest pulmonary emboli.  Heart is borderline in size.  Aorta is normal caliber.  No evidence of dissection.  Anterior mediastinal masses are again noted superiorly.  These are unchanged.  The largest measures 2.6 x 2.1 cm on image 30.  No other  adenopathy.  Small scattered axillary lymph nodes, not pathologically enlarged.  No pleural effusions.  Low lung volumes.  No confluent airspace opacities.  Imaging into the upper abdomen shows no acute findings.  No acute bony abnormality.  IMPRESSION: No evidence of pulmonary embolus.  Stable anterior mediastinal masses dating back to 2008.  Findings compatible with benign process.  No acute findings.   Original Report Authenticated By: Charlett Nose, M.D.      Orders placed during the hospital encounter of 10/28/12  . ED EKG  . ED EKG  . EKG 12-LEAD  . EKG 12-LEAD     Assessment/Plan Active Problems:   OBESITY NOS   DISORDER, DEPRESSIVE NEC   Unspecified essential hypertension   CHEST PAIN   DOE (dyspnea on exertion)   Diastolic CHF   Mediastinal mass  Acute on chronic chest pain syndrome and what appears to be Diastolic Dysfunction/DOE: No CTPA or CXR findings x mild int edema.  Trop I (-).  admit to step down.  Attempt control of her CP with NTG and MSO4 - Hydrocodone caused hallucinations in past - will monitor.  R/Out MI. ECG is nonspecific and NSR.  No Ischemia.  Cardiology will be consulted.  She has had a normal cardiac cath in 2008 and a normal Myoview stress test in December 2012. Last 2-D echocardiogram was obtained which showed EF of 55-60%, LVH, Grade 2 Diastolic Dysfunction, and normal wall motion with no regional wall motion abnormalities. Consider cardiac strain.  Treat c O2, Aggressive Diuresis, BB, ASA, ACEi, NTG, and CPAP.  OBESITY NOS: Patient was counseled strongly to lose weight and diet control.  Will ask Nutrition to see pt.  Pt desperately needs education @ CHF.   Hypertension: Attempt BP control.  Acute on chronic Diastolic ZOX:WRUEAVW c Lasix.  Needs diet control, 2 g sodium diet and diabetes control.   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  Todays CTPA (-) for PE but Stable anterior mediastinal masses dating back to 2008.  Findings  compatible with benign process.Patient was being followed as out pt by CTS but has not had f/u in awhile.  Since it is stable she probably does not need too.  Generalized aches and pains.  Will get CK and ESR.  Asthma - she is not wheezing.  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  She was encouraged to lose weight.  She is likely to be developing Obesity Hypoventilation Syndrome.  Will check Rm Air ABG.  Remains on inhalers  DM -ISS.  Hold Metformain after IV Contrast  Depression/Anxiety- Zoloft.  Ativan added  DVT Proph.  Hold NSAIDs - they are not needed  H/O latent Syphillis - PCP to comment  TSH/ESR/CK/A1c All pending  Shalicia Craghead M 10/28/2012, 9:26  PM

## 2012-10-28 NOTE — ED Notes (Signed)
Robyn A, PA at bedside

## 2012-10-29 ENCOUNTER — Other Ambulatory Visit: Payer: Self-pay

## 2012-10-29 ENCOUNTER — Encounter (HOSPITAL_COMMUNITY): Payer: Self-pay | Admitting: *Deleted

## 2012-10-29 DIAGNOSIS — R0602 Shortness of breath: Secondary | ICD-10-CM

## 2012-10-29 DIAGNOSIS — R079 Chest pain, unspecified: Secondary | ICD-10-CM

## 2012-10-29 DIAGNOSIS — E669 Obesity, unspecified: Secondary | ICD-10-CM

## 2012-10-29 LAB — MRSA PCR SCREENING: MRSA by PCR: NEGATIVE

## 2012-10-29 LAB — TROPONIN I
Troponin I: <0.3 ng/mL (ref <0.30)
Troponin I: <0.3 ng/mL (ref <0.30)
Troponin I: <0.3 ng/mL (ref <0.30)

## 2012-10-29 LAB — COMPREHENSIVE METABOLIC PANEL
ALT: 26 U/L (ref 0–35)
AST: 15 U/L (ref 0–37)
Albumin: 3.3 g/dL — ABNORMAL LOW (ref 3.5–5.2)
Alkaline Phosphatase: 53 U/L (ref 39–117)
BUN: 12 mg/dL (ref 6–23)
CO2: 31 mEq/L (ref 19–32)
Calcium: 8.9 mg/dL (ref 8.4–10.5)
Chloride: 103 mEq/L (ref 96–112)
Creatinine, Ser: 0.84 mg/dL (ref 0.50–1.10)
GFR calc Af Amer: >90 mL/min (ref >90)
GFR calc non Af Amer: 78 mL/min — ABNORMAL LOW (ref >90)
Glucose, Bld: 104 mg/dL — ABNORMAL HIGH (ref 70–99)
Potassium: 3.4 mEq/L — ABNORMAL LOW (ref 3.5–5.1)
Sodium: 141 mEq/L (ref 135–145)
Total Bilirubin: 0.2 mg/dL — ABNORMAL LOW (ref 0.3–1.2)
Total Protein: 6.6 g/dL (ref 6.0–8.3)

## 2012-10-29 LAB — CBC
HCT: 38 % (ref 36.0–46.0)
Hemoglobin: 12.2 g/dL (ref 12.0–15.0)
MCH: 27.7 pg (ref 26.0–34.0)
MCHC: 32.1 g/dL (ref 30.0–36.0)
MCV: 86.4 fL (ref 78.0–100.0)
Platelets: 241 10*3/uL (ref 150–400)
RBC: 4.4 MIL/uL (ref 3.87–5.11)
RDW: 13.9 % (ref 11.5–15.5)
WBC: 6 10*3/uL (ref 4.0–10.5)

## 2012-10-29 LAB — GLUCOSE, CAPILLARY
Glucose-Capillary: 111 mg/dL — ABNORMAL HIGH (ref 70–99)
Glucose-Capillary: 107 mg/dL — ABNORMAL HIGH (ref 70–99)
Glucose-Capillary: 114 mg/dL — ABNORMAL HIGH (ref 70–99)
Glucose-Capillary: 123 mg/dL — ABNORMAL HIGH (ref 70–99)
Glucose-Capillary: 126 mg/dL — ABNORMAL HIGH (ref 70–99)

## 2012-10-29 LAB — CK TOTAL AND CKMB (NOT AT ARMC)
CK, MB: 2.2 ng/mL (ref 0.3–4.0)
Relative Index: 1.9 (ref 0.0–2.5)
Total CK: 118 U/L (ref 7–177)

## 2012-10-29 LAB — HEMOGLOBIN A1C
Hgb A1c MFr Bld: 6.2 % — ABNORMAL HIGH (ref <5.7)
Mean Plasma Glucose: 131 mg/dL — ABNORMAL HIGH (ref <117)

## 2012-10-29 LAB — SEDIMENTATION RATE: Sed Rate: 13 mm/hr (ref 0–22)

## 2012-10-29 LAB — TSH: TSH: 0.167 u[IU]/mL — ABNORMAL LOW (ref 0.350–4.500)

## 2012-10-29 MED ORDER — LIVING WELL WITH DIABETES BOOK
Freq: Once | Status: AC
  Administered 2012-10-29: 11:00:00
  Filled 2012-10-29: qty 1

## 2012-10-29 MED ORDER — FUROSEMIDE 10 MG/ML IJ SOLN: 40.0000 mg | Freq: Two times a day (BID) | INTRAMUSCULAR | Status: DC

## 2012-10-29 MED ORDER — FUROSEMIDE 40 MG PO TABS
40.0000 mg | ORAL_TABLET | Freq: Every day | ORAL | Status: DC
  Administered 2012-10-29 – 2012-10-30 (×2): 40 mg via ORAL
  Filled 2012-10-29 (×2): qty 1

## 2012-10-29 NOTE — Progress Notes (Signed)
UR Completed Giani Betzold Graves-Bigelow, RN,BSN [phone]  

## 2012-10-29 NOTE — Consult Note (Signed)
CARDIOLOGY CONSULT NOTE    Patient ID: [name] MRN: [id number] DOB/AGE: Sep 28, 1960 52 y.o.  Admit date: 10/28/2012  Primary Physician   Alysia Penna, MD Primary Cardiologist   Dr. [place] Reason for Consultation   Chest pain, dyspnea on exertion  HPI: 67 F with known CHF, HTN, Obesity, OSA on CPAP but has not been wearing it in at least 10 + days, moderate restrictive lung Dz related to her centripetal obesity - Seeing Dr Shelle Iron, Previously seen by Dr Swaziland with normal heart cath in 2008 c Nml LV Fx. She was treated in January 2008 with Coumadin for 6 months for a question of pulmonary embolus on CT. Last 2D ECHO was EF 55-60% c mild LVH and known prior grade 2 diastolic Dysfunction.  She presented to the hospital on this admission for complaints of chest pain and pressure and accompanying mild diaphoresis. She describes an associated pain in the back that is sharp in nature. She also complains of DOE that has become worse recently. She reports only being able to go up a few steps without becoming SOB. She also reports a recent weight gain as well as 3 pillow orthopnea and some mild LE swelling. No recent history of fever, nausea, vomiting, dizziness, or lightheadedness.  Past Medical History  Diagnosis Date  . Hypertension   . Diabetes mellitus   . Asthma   . Anginal pain     a. NL cath in 2008;  b. Myoview 03/2011: dec uptake along mid anterior wall on stress imaging -> ? attenuation vs. ischemia, EF 65%;  c. Echo 04/2011: EF 55-60%, no RWMA, Gr 2 dd  . Depression   . Anxiety   . Pulmonary embolism     a. 2008 -> coumadin x 6 mos.  . Sleep apnea   . 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 Obesity   . CHF (congestive heart failure)   . CHF (congestive heart failure)   . Pulmonary edema   . Cancer   . Bone cancer      Past Surgical History  Procedure Laterality Date  . Abdominal hysterectomy  2005  . Leg surgery    . Tubal ligation  1989  .  Left knee surgery  2008  . Laparoscopic appendectomy N/A 06/03/2012    Procedure: APPENDECTOMY LAPAROSCOPIC;  Surgeon: Almond Lint, MD;  Location: [facility] OR;  Service: General;  Laterality: N/A;  . Cardiac catheterization      Normal    Allergies  Allergen Reactions  . Hydrocodone Other (See Comments)    Causes Hallucinations  . Penicillins Swelling  . Sulfa Antibiotics Itching and Rash    I have reviewed the patient's current medications . aspirin  81 mg Oral Daily  . atorvastatin  40 mg Oral QHS  . carvedilol  10 mg Oral Daily  . enoxaparin (LOVENOX) injection  40 mg Subcutaneous Q24H  . fluticasone  1 puff Inhalation BID  . furosemide  40 mg Intravenous BID  . insulin aspart  0-15 Units Subcutaneous TID WC  . insulin aspart  0-5 Units Subcutaneous QHS  . isosorbide dinitrate  20 mg Oral BID  . lisinopril  20 mg Oral Daily  . loratadine  10 mg Oral Daily  . pantoprazole  40 mg Oral Daily  . potassium chloride  20 mEq Oral BID  . sertraline  100 mg Oral Daily  . sodium chloride  3 mL Intravenous Q12H  sodium chloride, acetaminophen, acetaminophen, albuterol, albuterol, LORazepam, morphine injection, nitroGLYCERIN, ondansetron (ZOFRAN) IV, ondansetron, sodium chloride  Prior to Admission medications   Medication Sig Start Date End Date Taking? Authorizing Provider  albuterol (PROVENTIL,VENTOLIN) 90 MCG/ACT inhaler Inhale 2 puffs into the lungs every 4 (four) hours as needed for shortness of breath.    Yes Historical Provider, MD  aspirin 81 MG tablet Take 81 mg by mouth daily.     Yes Historical Provider, MD  atorvastatin (LIPITOR) 40 MG tablet Take 40 mg by mouth at bedtime.    Yes Historical Provider, MD  beclomethasone (QVAR) 80 MCG/ACT inhaler Inhale 1 puff into the lungs 2 (two) times daily.   Yes Historical Provider, MD  carvedilol (COREG CR) 10 MG 24 hr capsule Take 1 capsule (10 mg total) by mouth daily. 01/11/12  Yes Ripudeep Jenna Luo, MD  furosemide (LASIX) 40 MG tablet  Take 40 mg by mouth daily.   Yes Ripudeep Jenna Luo, MD  ibuprofen (ADVIL,MOTRIN) 200 MG tablet Take 200 mg by mouth every 6 (six) hours as needed. For pain   Yes Historical Provider, MD  isosorbide dinitrate (ISORDIL) 20 MG tablet Take 20 mg by mouth 2 (two) times daily.    Yes Historical Provider, MD  lisinopril (PRINIVIL,ZESTRIL) 20 MG tablet Take 20 mg by mouth daily.    Yes Historical Provider, MD  loratadine (CLARITIN) 10 MG tablet Take 10 mg by mouth daily.   Yes Historical Provider, MD  metFORMIN (GLUCOPHAGE) 1000 MG tablet Take 1,000 mg by mouth 2 (two) times daily with a meal.   Yes Historical Provider, MD  omeprazole (PRILOSEC) 20 MG capsule Take 20 mg by mouth daily.     Yes Historical Provider, MD  potassium chloride (K-DUR,KLOR-CON) 10 MEQ tablet Take 2 tablets (20 mEq total) by mouth 2 (two) times daily. 01/24/12  Yes Olivia Mackie, MD  sertraline (ZOLOFT) 100 MG tablet Take 100 mg by mouth daily.    Yes Historical Provider, MD  nitroGLYCERIN (NITROSTAT) 0.4 MG SL tablet Place 0.4 mg under the tongue every 5 (five) minutes as needed. For chest pain.    Historical Provider, MD     History   Social History  . Marital Status: Legally Separated    Spouse Name: N/A    Number of Children: N/A  . Years of Education: N/A   Occupational History  . UNEMPLOYED    Social History Main Topics  . Smoking status: Former Smoker -- 0.50 packs/day for 15 years    Types: Cigarettes  . Smokeless tobacco: Not on file  . Alcohol Use: No  . Drug Use: No  . Sexually Active: Not on file   Other Topics Concern  . Not on file   Social History Narrative   Lives in [place] by herself.  Disabled.    Family Status  Relation Status Death Age  . Mother Alive   . Father Deceased    Family History  Problem Relation Age of Onset  . Emphysema Mother   . Arthritis Mother   . Heart failure Mother     alive @ 34  . Asthma Brother   . Heart disease Paternal Grandfather   . Heart disease Father     died  @ 63's.  . Stroke Father   . Colon cancer Maternal Grandfather      ROS:  Full 14 point review of systems complete and found to be negative unless listed above.  Physical Exam: Blood pressure 153/89, pulse 70,  temperature 97.8 F (36.6 C), temperature source Oral, resp. rate 15, height 5' (1.524 m), weight 223 lb 1.7 oz (101.2 kg), SpO2 99.00%.  General: Well developed, well nourished, female in no acute distress Head: Eyes PERRLA, No xanthomas.  Normocephalic and atraumatic, oropharynx without edema or exudate. Dentition:  Lungs: Clear to aascultation. No rales or wheezes. Heart: HRRR S1 S2, no rub/gallop, Heart irregular rate and rhythm with S1, S2  murmur. pulses are 2+ extrem.   Neck: No carotid bruits. No lymphadenopathy.  JVD. Abdomen: Bowel sounds present, abdomen soft and non-tender without masses or hernias noted. Msk:  No spine or cva tenderness. No weakness, no joint deformities or effusions. Extremities: No clubbing or cyanosis.  edema.  Neuro: Alert and oriented X 3. No focal deficits noted. Psych:  Good affect, responds appropriately Skin: No rashes or lesions noted.  Labs:   Lab Results  Component Value Date   WBC 6.0 10/29/2012   HGB 12.2 10/29/2012   HCT 38.0 10/29/2012   MCV 86.4 10/29/2012   PLT 241 10/29/2012   No results found for this basename: INR,  in the last 72 hours  Recent Labs Lab 10/29/12 0438  NA 141  K 3.4*  CL 103  CO2 31  BUN 12  CREATININE 0.84  CALCIUM 8.9  PROT 6.6  BILITOT 0.2*  ALKPHOS 53  ALT 26  AST 15  GLUCOSE 104*   Magnesium  Date Value Range Status  05/29/2010 2.1  1.5 - 2.5 mg/dL Final    Recent Labs  30/86/57 0144 10/29/12 0438  CKTOTAL 118  --   CKMB 2.2  --   TROPONINI <0.30 <0.30    Recent Labs  10/28/12 1734  TROPIPOC 0.01   Pro B Natriuretic peptide (BNP)  Date/Time Value Range Status  10/28/2012  5:14 PM 104.1  0 - 125 pg/mL Final  07/21/2012  4:08 PM 53.0  0 - 125 pg/mL Final   Lab Results  Component  Value Date   CHOL 173 04/23/2011   CHOL 171 04/23/2011   HDL 41 04/23/2011   HDL 40 04/23/2011   LDLCALC 103* 04/23/2011   LDLCALC 102* 04/23/2011   TRIG 147 04/23/2011   TRIG 143 04/23/2011   Lab Results  Component Value Date   DDIMER 0.52* 07/21/2012   Lipase  Date/Time Value Range Status  03/15/2011 11:25 AM 38  11 - 59 U/L Final   TSH  Date/Time Value Range Status  01/09/2012  8:15 AM 0.218* 0.350 - 4.500 uIU/mL Final   No results found for this basename: VitaminB12, Folate, Ferritin, TIBC, Iron, reticctpct    Echo:   ECG:  NSR  Radiology:  Dg Chest 2 View  10/28/2012   Chest pain.  *RADIOLOGY REPORT*  Clinical Data:  Chest pain  CHEST - 2 VIEW  Comparison: 07/21/2012  Findings: Cardiomegaly with vascular congestion and bilateral interstitial and alveolar opacities, likely mild edema.  Recommend clinical correlation to exclude infection.  No visible effusions. Mild hyperinflation of the lungs.  No acute bony abnormality.  IMPRESSION: Suspect mild edema/CHF.  Recommend clinical correlation to exclude infection.   Original Report Authenticated By: Charlett Nose, M.D.   Ct Angio Chest Pe W/cm &/or Wo Cm  10/28/2012   *RADIOLOGY REPORT*  Clinical Data: Chest pain, shortness of breath.  CT ANGIOGRAPHY CHEST  Technique:  Multidetector CT imaging of the chest using the standard protocol during bolus administration of intravenous contrast. Multiplanar reconstructed images including MIPs were obtained and reviewed to evaluate the  vascular anatomy.  Contrast: OMNIPAQUE IOHEXOL 350 MG/ML SOLN  Comparison: 07/21/2012  Findings: No filling defects in the pulmonary arteries to suggest pulmonary emboli.  Heart is borderline in size.  Aorta is normal caliber.  No evidence of dissection.  Anterior mediastinal masses are again noted superiorly.  These are unchanged.  The largest measures 2.6 x 2.1 cm on image 30.  No other adenopathy.  Small scattered axillary lymph nodes, not pathologically  enlarged.  No pleural effusions.  Low lung volumes.  No confluent airspace opacities.  Imaging into the upper abdomen shows no acute findings.  No acute bony abnormality.  IMPRESSION: No evidence of pulmonary embolus.  Stable anterior mediastinal masses dating back to 2008.  Findings compatible with benign process.  No acute findings.   Original Report Authenticated By: Charlett Nose, M.D.    ASSESSMENT AND PLAN:     52 y/o F w/ known history of CHF, obesity, HTN, and OSA on CPAP, presented w/ dyspea on exertion and atypical chest pain. After seeing the patient, it is not suspected that her complaints are cardiac related. However, it is recommended that she get a 2 day Myoview as an out-patient to rule out any cardiac causes of her chest pain. It was also recommended that she follow up with her PCP with regards to her pain and difficulty breathing if they continue. A diet and exercise regimen was discussed as her weight is likely the primary cause for her diastolic dysfunction at this time.   Signed: Lars Masson, MD 10/29/2012 9:03 AM  Attending Note:   The patient was seen and examined.  Agree with assessment and plan as noted above.  Changes made to the above note as needed.  Pt has had CP / tightness for years.  She had a normal cardiac cath in 2008.  She has diastolic dysfunction by echo.  She does some water aerobics but otherwise does not do much regular exercise.    Exam is as above and is unremarkable.  Imp:  Her CP / tightness is very atypical and is not likely to be due to acute coronary syndrome.  She has moderate pulmonary disease (reduced  diffusion capacity).    Her Troponin is negative.   Will be OK to DC her to home .  Our office will call her and arrange for her to have a 2 day stress myoview.  She can follow up with Dr. [place] if needed.     Vesta Mixer, Montez Hageman., MD, [facility] 10/29/2012, 11:03 AM

## 2012-10-29 NOTE — Progress Notes (Signed)
Pt declined to ambulate in hallway this evening.  States " probably later."   Amanda Pea, Charity fundraiser.

## 2012-10-29 NOTE — Evaluation (Signed)
Physical Therapy Evaluation Patient Details Name: [name] MRN: [id number] DOB: August 12, 1960 Today's Date: 10/29/2012 Time: 4098-1191 PT Time Calculation (min): 27 min  PT Assessment / Plan / Recommendation History of Present Illness  Pt admitted with DOE and chest pain. H/o HF  Clinical Impression  Pt functioning near baseline. Pt with decreased activity tolerance/endurance. Pt to benefit from use of RW initially until for energy conservation. PT to see patient 10/30/12 to further assess patient but anticipate pt to be safe for d/c home with mother when medically stable. Pt mobility limited by cardiopulmonary deficits however is functioning at supervision level currently.    PT Assessment  Patient needs continued PT services    Follow Up Recommendations  No PT follow up;Supervision - Intermittent    Does the patient have the potential to tolerate intense rehabilitation      Barriers to Discharge        Equipment Recommendations  None recommended by PT    Recommendations for Other Services     Frequency Min 3X/week    Precautions / Restrictions Precautions Precautions: Fall Restrictions Weight Bearing Restrictions: No   Pertinent Vitals/Pain Reported episodes of CP, RN notified. Pt reports "the doctors can't explain it."      Mobility  Bed Mobility Bed Mobility: Supine to Sit Supine to Sit: 6: Modified independent (Device/Increase time);With rails;HOB elevated Details for Bed Mobility Assistance: increased time Transfers Transfers: Sit to Stand;Stand to Sit Sit to Stand: 5: Supervision;With upper extremity assist;From bed Stand to Sit: 5: Supervision;With upper extremity assist;To chair/3-in-1 Details for Transfer Assistance: safe technique Ambulation/Gait Ambulation/Gait Assistance: 4: Min guard Ambulation Distance (Feet): 100 Feet Assistive device: Rolling walker;None Ambulation/Gait Assistance Details: initially amb with no AD however pt reaching for  things to hold onto and reported light-headedness. Pt with increased stability with RW. no episodse of LOB Gait Pattern: Step-through pattern;Decreased stride length Gait velocity: wfl Stairs: No    Exercises     PT Diagnosis: Generalized weakness  PT Problem List: Decreased activity tolerance;Decreased balance;Decreased mobility PT Treatment Interventions: DME instruction;Gait training;Stair training;Functional mobility training;Therapeutic activities     PT Goals(Current goals can be found in the care plan section) Acute Rehab PT Goals Patient Stated Goal: home PT Goal Formulation: With patient Time For Goal Achievement: 11/05/12 Potential to Achieve Goals: Good  Visit Information  Last PT Received On: 10/29/12 Assistance Needed: +1 History of Present Illness: Pt admitted with DOE and chest pain.       Prior Functioning  Home Living Family/patient expects to be discharged to:: Private residence Living Arrangements: Parent Available Help at Discharge: Family;Available 24 hours/day Type of Home: Apartment Home Access: Level entry Home Layout: One level Home Equipment: Walker - 2 wheels Prior Function Level of Independence: Independent Comments: pt reports "It doesn't take much for me to get tired. I'm tired after sweeping the floor." Communication Communication: No difficulties Dominant Hand: Right    Cognition  Cognition Arousal/Alertness: Awake/alert Behavior During Therapy: WFL for tasks assessed/performed Overall Cognitive Status: Within Functional Limits for tasks assessed    Extremity/Trunk Assessment Upper Extremity Assessment Upper Extremity Assessment: Overall WFL for tasks assessed Lower Extremity Assessment Lower Extremity Assessment: Overall WFL for tasks assessed Cervical / Trunk Assessment Cervical / Trunk Assessment: Normal   Balance    End of Session PT - End of Session Equipment Utilized During Treatment: Gait belt Activity Tolerance:   (limited by cardiopulmoary deficits) Patient left: in chair;with call bell/phone within reach Nurse Communication: Mobility status (amb with  pt 2-3x/day)  GP     Marcene Brawn 10/29/2012, 12:11 PM  Lewis Shock, PT, DPT Pager #: [phone] Office #: [phone]

## 2012-10-29 NOTE — Progress Notes (Signed)
Report called to Nellie, receiving RN on [id number].  Pt transferred to [id number] via wheelchair with all belongings.  Roselie Awkward, RN

## 2012-10-29 NOTE — Progress Notes (Signed)
Inpatient Diabetes Program Recommendations  AACE/ADA: New Consensus Statement on Inpatient Glycemic Control (2013)  Target Ranges:  Prepandial:   less than 140 mg/dL      Peak postprandial:   less than 180 mg/dL (1-2 hours)      Critically ill patients:  140 - 180 mg/dL   Consult for control of glucose Glucose levels are well controlled presently. Please order HgbA1C to assess current control.  Last A1C done on 01/10/2012 with result of 6.8% Will add carbohydrate modified to low sodium diet orders. If pt needs education, bedside RN's can assist with pt watching system ed'l network videos, exit care notes, and I will order ADA booklet and dietician consult. However, although pt had symptoms of polydipsia and frequent urination, glucose levels do not reflect at this time. Pt can also attend OP education at [facility] per physician order. Thank you, Lenor Coffin, RN, CNS, Diabetes Coordinator [phone])

## 2012-10-29 NOTE — Progress Notes (Signed)
Pt refused SCD's on.  Instructed her to call later to have it on at bed time as pt. Had suggested.  Amanda Pea, Charity fundraiser.

## 2012-10-29 NOTE — Plan of Care (Signed)
Problem: Food- and Nutrition-Related Knowledge Deficit (NB-1.1) Goal: Nutrition education Formal process to instruct or train a patient/client in a skill or to impart knowledge to help patients/clients voluntarily manage or modify food choices and eating behavior to maintain or improve health. Outcome: Completed/Met Date Met:  10/29/12 Nutrition Education Note  RD consulted for nutrition education regarding HF. Pt states that she was unaware of being on a fluid restriction. Reviewed her current diet order and fluid restriction of 1800 ml daily. Also reviewed weight loss tips and discussed overall healthy eating (decreasing sugar sweetened beverages, portion control, healthy cooking methods, etc.)  RD provided "Low Sodium Nutrition Therapy" handout from the Academy of Nutrition and Dietetics. Reviewed patient's dietary recall. Provided examples on ways to decrease sodium intake in diet. Discouraged intake of processed foods and use of salt shaker. Encouraged fresh fruits and vegetables as well as whole grain sources of carbohydrates to maximize fiber intake.   RD discussed why it is important for patient to adhere to diet recommendations, and emphasized the role of fluids, foods to avoid, and importance of weighing self daily. Teach back method used.  Expect fair compliance.  Body mass index is 43.49 kg/(m^2). Pt meets criteria for Obese Class I based on current BMI.  Current diet order is Low Sodium. Labs and medications reviewed. No further nutrition interventions warranted at this time. RD contact information provided. If additional nutrition issues arise, please re-consult RD.   Jarold Motto MS, RD, LDN Pager: [phone] After-hours pager: [phone]

## 2012-10-29 NOTE — Progress Notes (Addendum)
Physician Daily Progress Note  Subjective: Continues to have substernal CP w/ some back pain.  Continues to complain of being short winded.   Objective: Vital signs in last 24 hours: Temp:  [97.9 F (36.6 C)-98.1 F (36.7 C)] 97.9 F (36.6 C) (07/08 0400) Pulse Rate:  [67-76] 73 (07/08 0410) Resp:  [10-26] 15 (07/08 0410) BP: (132-164)/(66-97) 132/66 mmHg (07/08 0410) SpO2:  [94 %-99 %] 98 % (07/08 0410) Weight:  [223 lb 1.7 oz (101.2 kg)] 223 lb 1.7 oz (101.2 kg) (07/07 2240) Weight change:     CBG (last 3)   Recent Labs  10/28/12 2235  GLUCAP 106*    Intake/Output from previous day:  Intake/Output Summary (Last 24 hours) at 10/29/12 0734 Last data filed at 10/28/12 2300  Gross per 24 hour  Intake    480 ml  Output    375 ml  Net    105 ml   07/07 0701 - 07/08 0700 In: 480 [P.O.:480] Out: 375 [Urine:375]  Physical Exam General appearance: obese AAF in NAD  Eyes: no scleral icterus Throat: oropharynx moist without erythema Resp: CTAB, no wheezes or rales  Cardio: RRR, no MRG  GI: soft, non-tender; bowel sounds normal; no masses,  no organomegaly Extremities: no clubbing, cyanosis or edema   Lab Results:  Recent Labs  10/28/12 1714 10/29/12 0438  NA 142 141  K 3.6 3.4*  CL 103 103  CO2 30 31  GLUCOSE 93 104*  BUN 13 12  CREATININE 0.98 0.84  CALCIUM 9.0 8.9     Recent Labs  10/29/12 0438  AST 15  ALT 26  ALKPHOS 53  BILITOT 0.2*  PROT 6.6  ALBUMIN 3.3*     Recent Labs  10/28/12 1714 10/29/12 0438  WBC 7.2 6.0  HGB 13.0 12.2  HCT 39.0 38.0  MCV 85.2 86.4  PLT 244 241    Lab Results  Component Value Date   INR 1.00 06/03/2012   INR 0.95 01/08/2012   INR 0.91 04/23/2011     Recent Labs  10/28/12 0144 10/29/12 0438  CKTOTAL 118  --   CKMB 2.2  --   TROPONINI <0.30 <0.30    No results found for this basename: TSH, T4TOTAL, FREET3, T3FREE, THYROIDAB,  in the last 72 hours  No results found for this basename:  VITAMINB12, FOLATE, FERRITIN, TIBC, IRON, RETICCTPCT,  in the last 72 hours  Micro Results: Recent Results (from the past 240 hour(s))  MRSA PCR SCREENING     Status: None   Collection Time    10/28/12 11:28 PM      Result Value Range Status   MRSA by PCR NEGATIVE  NEGATIVE Final   Comment:            The GeneXpert MRSA Assay (FDA     approved for NASAL specimens     only), is one component of a     comprehensive MRSA colonization     surveillance program. It is not     intended to diagnose MRSA     infection nor to guide or     monitor treatment for     MRSA infections.    Studies/Results: Dg Chest 2 View  10/28/2012   Chest pain.  *RADIOLOGY REPORT*  Clinical Data:  Chest pain  CHEST - 2 VIEW  Comparison: 07/21/2012  Findings: Cardiomegaly with vascular congestion and bilateral interstitial and alveolar opacities, likely mild edema.  Recommend clinical correlation to exclude infection.  No visible effusions.  Mild hyperinflation of the lungs.  No acute bony abnormality.  IMPRESSION: Suspect mild edema/CHF.  Recommend clinical correlation to exclude infection.   Original Report Authenticated By: Charlett Nose, M.D.   Ct Angio Chest Pe W/cm &/or Wo Cm  10/28/2012   *RADIOLOGY REPORT*  Clinical Data: Chest pain, shortness of breath.  CT ANGIOGRAPHY CHEST  Technique:  Multidetector CT imaging of the chest using the standard protocol during bolus administration of intravenous contrast. Multiplanar reconstructed images including MIPs were obtained and reviewed to evaluate the vascular anatomy.  Contrast: OMNIPAQUE IOHEXOL 350 MG/ML SOLN  Comparison: 07/21/2012  Findings: No filling defects in the pulmonary arteries to suggest pulmonary emboli.  Heart is borderline in size.  Aorta is normal caliber.  No evidence of dissection.  Anterior mediastinal masses are again noted superiorly.  These are unchanged.  The largest measures 2.6 x 2.1 cm on image 30.  No other adenopathy.  Small scattered  axillary lymph nodes, not pathologically enlarged.  No pleural effusions.  Low lung volumes.  No confluent airspace opacities.  Imaging into the upper abdomen shows no acute findings.  No acute bony abnormality.  IMPRESSION: No evidence of pulmonary embolus.  Stable anterior mediastinal masses dating back to 2008.  Findings compatible with benign process.  No acute findings.   Original Report Authenticated By: Charlett Nose, M.D.     Medications: Scheduled: . aspirin  81 mg Oral Daily  . atorvastatin  40 mg Oral QHS  . carvedilol  10 mg Oral Daily  . enoxaparin (LOVENOX) injection  40 mg Subcutaneous Q24H  . fluticasone  1 puff Inhalation BID  . furosemide  40 mg Intravenous BID  . insulin aspart  0-15 Units Subcutaneous TID WC  . insulin aspart  0-5 Units Subcutaneous QHS  . isosorbide dinitrate  20 mg Oral BID  . lisinopril  20 mg Oral Daily  . loratadine  10 mg Oral Daily  . pantoprazole  40 mg Oral Daily  . potassium chloride  20 mEq Oral BID  . sertraline  100 mg Oral Daily  . 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 She has nitro and morphine prn for pain control. Her last TTE was 1 year prior w/ stress myoview in 2008, all showing no signs of cardiac impairment. This admission, her CTA ruled out dissection and PE. She presented 1 year ago w/ these same symptoms, and it was determined to be unlikely cardiac in nature. Given these symptoms have persisted and she is now again hospitalized for these symptoms, I will repeat TTE and consult cardiology for further recommendations. She will remain on home BB, ASA, nitrate   #Acute on chronic CHF, diastolic - while she does have a BNP mildly elevated at approx 100 and CXR w/ mild edema, she remains at baseline weight per my chart review (224lbs) and her renal function remains stable. Will diurese w/ IV lasix x 2 today, and then resume on home lasix 40mg   PO daily tomorrow. She claims compliance w/ home meds. We will place consult to have patient educated on CHF diet and weight loss   #OSA - resume home CPAP qhs.  #Obesity hypoventilation syndrome - consult placed to counsel patient on weight loss . Normal PFTs in recent past, ruling out COPD. Has been evaluated by pulmonary. ABG showed daytime hypercapnia and her BMI is > 30. Checking TSH to  ensure no hypothyroidism. Ensure she continues to use CPAP and avoid excessive supplemental O2. May need to consider bariatric surgery in the future to facilitate weight loss. #HTN - resume home meds  #Reactive airway disease - resume home meds  #HLD - resume home meds  #DM - was controlled on home meds w/ A1C 6.1. Given contrast load in hospital, currently on sliding scale. Consult placed to diabetic educator.    DVT Prophylaxis - PPI, lovenox, SCDs  Dispo - currently living w/ mom in between finding a home. Consult placed to social work for assistance   LOS: 1 day   Alvis Pulcini 10/29/2012, 7:34 AM

## 2012-10-29 NOTE — ED Provider Notes (Signed)
  This was a shared visit with a mid-level provided (NP or PA).  Throughout the patient's course I was available for consultation/collaboration.  I saw the ECG (if appropriate), relevant labs and studies - I agree with the interpretation.  On my exam the patient was in no distress.  However, she continued to be dyspneic.  She was admitted for further evaluation and management.      Gerhard Munch, MD 10/29/12 [phone]

## 2012-10-29 NOTE — Care Management Note (Unsigned)
    Page 1 of 1   10/29/2012     2:17:22 PM   CARE MANAGEMENT NOTE 10/29/2012  Patient:  [name], [name]   Account Number:  [id number]  Date Initiated:  10/29/2012  Documentation initiated by:  GRAVES-BIGELOW,Tasheba Henson  Subjective/Objective Assessment:   Pt admitted for cp- enzymes negative. Placed on IV lasix. Plan for home once medically stable.     Action/Plan:   CM will conitnue ot monitor for disposition needs.   Anticipated DC Date:  10/30/2012   Anticipated DC Plan:  HOME/SELF CARE      DC Planning Services  CM consult      Choice offered to / List presented to:             Status of service:  In process, will continue to follow Medicare Important Message given?   (If response is "NO", the following Medicare IM given date fields will be blank) Date Medicare IM given:   Date Additional Medicare IM given:    Discharge Disposition:    Per UR Regulation:  Reviewed for med. necessity/level of care/duration of stay  If discussed at Long Length of Stay Meetings, dates discussed:    Comments:

## 2012-10-30 DIAGNOSIS — I509 Heart failure, unspecified: Secondary | ICD-10-CM

## 2012-10-30 LAB — GLUCOSE, CAPILLARY: Glucose-Capillary: 107 mg/dL — ABNORMAL HIGH (ref 70–99)

## 2012-10-30 MED ORDER — POTASSIUM CHLORIDE CRYS ER 20 MEQ PO TBCR
20.0000 meq | EXTENDED_RELEASE_TABLET | Freq: Once | ORAL | Status: AC
  Administered 2012-10-30: 20 meq via ORAL

## 2012-10-30 MED ORDER — TRAMADOL HCL 50 MG PO TABS
50.0000 mg | ORAL_TABLET | Freq: Four times a day (QID) | ORAL | Status: DC | PRN
Start: 2012-10-30 — End: 2013-04-18

## 2012-10-30 NOTE — Progress Notes (Signed)
Physical Therapy Treatment Patient Details Name: [name] MRN: [id number] DOB: May 23, 1960 Today's Date: 10/30/2012 Time: [id number] PT Time Calculation (min): 20 min  PT Assessment / Plan / Recommendation  PT Comments   Pt making progress toward PT goals. Pt is limited by fatigue and c/o mild dizziness, but is able to ambulate with supervision. Pt was educated on importance of pacing and to take rest breaks as needed. Pt is planning to d/c home today where she will be staying with her mother and has a RW.  Follow Up Recommendations  No PT follow up;Supervision - Intermittent     Does the patient have the potential to tolerate intense rehabilitation     Barriers to Discharge        Equipment Recommendations  None recommended by PT    Recommendations for Other Services    Frequency Min 3X/week   Progress towards PT Goals Progress towards PT goals: Progressing toward goals  Plan      Precautions / Restrictions Precautions Precautions: Fall Restrictions Weight Bearing Restrictions: No   Pertinent Vitals/Pain No c/o pain reported today.    Mobility  Bed Mobility Bed Mobility: Supine to Sit;Sitting - Scoot to Edge of Bed Supine to Sit: 6: Modified independent (Device/Increase time) Sitting - Scoot to Edge of Bed: 6: Modified independent (Device/Increase time) Details for Bed Mobility Assistance: increased time Transfers Transfers: Sit to Stand;Stand to Sit Sit to Stand: 6: Modified independent (Device/Increase time) Stand to Sit: 6: Modified independent (Device/Increase time) Details for Transfer Assistance: increase time and effort Ambulation/Gait Ambulation/Gait Assistance: 5: Supervision Ambulation Distance (Feet): 250 Feet Assistive device: Rolling walker Ambulation/Gait Assistance Details: patient steady with RW. Needed standing rest breaks X 4 due to fatigue and c/o of mild dizziness. Pt reported using a RW at home on occasion and feels safer with it. Gait  Pattern: Step-through pattern;Decreased stride length Gait velocity: wfl Stairs: No    Exercises         PT Goals (current goals can now be found in the care plan section) Acute Rehab PT Goals Time For Goal Achievement: 11/05/12 Potential to Achieve Goals: Good  Visit Information  Last PT Received On: 10/30/12 Assistance Needed: +1 History of Present Illness: Pt admitted with DOE and chest pain.    Subjective Data      Cognition  Cognition Arousal/Alertness: Awake/alert Behavior During Therapy: WFL for tasks assessed/performed Overall Cognitive Status: Within Functional Limits for tasks assessed    Balance     End of Session PT - End of Session Equipment Utilized During Treatment: Gait belt Activity Tolerance: Patient tolerated treatment well Patient left: in chair;with call bell/phone within reach Nurse Communication: Mobility status   GP     Jolyn Nap, SPTA 10/30/2012, 10:36 AM

## 2012-10-30 NOTE — Discharge Summary (Signed)
Physician Discharge Summary    DANAISHA CELLI  MR#: [id number]  DOB:1961/03/14  Date of Admission: 10/28/2012 Date of Discharge: 10/30/2012  Attending Physician:Makyiah Lie  Patient's YQM:VHQIONGE, Lorin Picket, MD  Consults:Treatment Team:  Dory [facility], MD  Discharge Diagnoses: Active Problems:   OBESITY NOS   DISORDER, DEPRESSIVE NEC   Unspecified essential hypertension   CHEST PAIN   DOE (dyspnea on exertion)   Diastolic CHF   Mediastinal mass   Type II or unspecified type diabetes mellitus without mention of complication, uncontrolled   Discharge Medications:   Medication List    ASK your doctor about these medications       albuterol 90 MCG/ACT inhaler  Commonly known as:  PROVENTIL,VENTOLIN  Inhale 2 puffs into the lungs every 4 (four) hours as needed for shortness of breath.     aspirin 81 MG tablet  Take 81 mg by mouth daily.     atorvastatin 40 MG tablet  Commonly known as:  LIPITOR  Take 40 mg by mouth at bedtime.     beclomethasone 80 MCG/ACT inhaler  Commonly known as:  QVAR  Inhale 1 puff into the lungs 2 (two) times daily.     carvedilol 10 MG 24 hr capsule  Commonly known as:  COREG CR  Take 1 capsule (10 mg total) by mouth daily.     furosemide 40 MG tablet  Commonly known as:  LASIX  Take 40 mg by mouth daily.     ibuprofen 200 MG tablet  Commonly known as:  ADVIL,MOTRIN  Take 200 mg by mouth every 6 (six) hours as needed. For pain     isosorbide dinitrate 20 MG tablet  Commonly known as:  ISORDIL  Take 20 mg by mouth 2 (two) times daily.     lisinopril 20 MG tablet  Commonly known as:  PRINIVIL,ZESTRIL  Take 20 mg by mouth daily.     loratadine 10 MG tablet  Commonly known as:  CLARITIN  Take 10 mg by mouth daily.     metFORMIN 1000 MG tablet  Commonly known as:  GLUCOPHAGE  Take 1,000 mg by mouth 2 (two) times daily with a meal.     nitroGLYCERIN 0.4 MG SL tablet  Commonly known as:  NITROSTAT  Place 0.4 mg  under the tongue every 5 (five) minutes as needed. For chest pain.     omeprazole 20 MG capsule  Commonly known as:  PRILOSEC  Take 20 mg by mouth daily.     potassium chloride 10 MEQ tablet  Commonly known as:  K-DUR,KLOR-CON  Take 2 tablets (20 mEq total) by mouth 2 (two) times daily.     sertraline 100 MG tablet  Commonly known as:  ZOLOFT  Take 100 mg by mouth daily.        Hospital Procedures: Dg Chest 2 View  10/28/2012   Chest pain.  *RADIOLOGY REPORT*  Clinical Data:  Chest pain  CHEST - 2 VIEW  Comparison: 07/21/2012  Findings: Cardiomegaly with vascular congestion and bilateral interstitial and alveolar opacities, likely mild edema.  Recommend clinical correlation to exclude infection.  No visible effusions. Mild hyperinflation of the lungs.  No acute bony abnormality.  IMPRESSION: Suspect mild edema/CHF.  Recommend clinical correlation to exclude infection.   Original Report Authenticated By: Charlett Nose, M.D.   Ct Angio Chest Pe W/cm &/or Wo Cm  10/28/2012   *RADIOLOGY REPORT*  Clinical Data: Chest pain, shortness of breath.  CT ANGIOGRAPHY CHEST  Technique:  Multidetector CT  imaging of the chest using the standard protocol during bolus administration of intravenous contrast. Multiplanar reconstructed images including MIPs were obtained and reviewed to evaluate the vascular anatomy.  Contrast: OMNIPAQUE IOHEXOL 350 MG/ML SOLN  Comparison: 07/21/2012  Findings: No filling defects in the pulmonary arteries to suggest pulmonary emboli.  Heart is borderline in size.  Aorta is normal caliber.  No evidence of dissection.  Anterior mediastinal masses are again noted superiorly.  These are unchanged.  The largest measures 2.6 x 2.1 cm on image 30.  No other adenopathy.  Small scattered axillary lymph nodes, not pathologically enlarged.  No pleural effusions.  Low lung volumes.  No confluent airspace opacities.  Imaging into the upper abdomen shows no acute findings.  No acute bony  abnormality.  IMPRESSION: No evidence of pulmonary embolus.  Stable anterior mediastinal masses dating back to 2008.  Findings compatible with benign process.  No acute findings.   Original Report Authenticated By: Charlett Nose, M.D.    History of Present Illness: Pt was admitted for chest, shoulder, R arm pain and sensation of leg edema   Hospital Course:  Obesity Hypoventilation syndrome - ABG verified compensated hypercarbia w/ CO2 of 49.9 and pH of 7.385. She has BMI > 30. She was educated by nutritionist on importance of weight loss. This is likely contributing to her diastolic heart failure and dyspnea on exertion. She has seen pulmonology and been given this diagnosis in the past. She had PFTs in the past 12 months to verify she does not have COPD.  Chronic Diastolic CHF - she underwent lasix 40mg  IV x 2 doses and was noted to be 500cc net negative prior to discharge. Her weight was noted to be the same as weight at prior office visit (222lbs). She had no LE edema on exam. She will be resumed on home lasix 40mg  PO QD and f/u next week w/ my office.   Chest pain - CTA showed no PE and no dissection of aorta. Trop neg x 3 and EKG w/o changes indicative of ischemia. [facility] was consulted who feel this pain is unlikely to be cardiac, but will order outpatient stress myoview to complete her w/u. This has been scheduled for next week.   Hypokalemia - noted on admission labs. She will have extra dose of Kdur given prior to discharge and will have f/u BMP in the next 1-2 weeks. No changes were made to here diuretics at discharge   R arm/shoulder pain - added ultram and removed NSAIDs to her pain regimen. She needs to set up f/u visit w/ her orthopedist in the next 1-2 weeks for further eval. This condition is chronic and she has been aware of need for f/u w/ ortho for the past month.   Day of Discharge Exam BP 149/85  Pulse 74  Temp(Src) 98.1 F (36.7 C) (Oral)  Resp 18  Ht 5' (1.524  m)  Wt 224 lb 6.9 oz (101.8 kg)  BMI 43.83 kg/m2  SpO2 95%  Physical Exam: General appearance: AAF in NAD  Eyes: no scleral icterus Throat: oropharynx moist without erythema Resp: CTAB, no wheezes or rales  Cardio: RRR, no MRG, 2+ DP pulses B/L  GI: soft, non-tender; bowel sounds normal; no masses,  no organomegaly Extremities: no clubbing, cyanosis or edema  Discharge Labs:  Recent Labs  10/28/12 1714 10/29/12 0438  NA 142 141  K 3.6 3.4*  CL 103 103  CO2 30 31  GLUCOSE 93 104*  BUN 13  12  CREATININE 0.98 0.84  CALCIUM 9.0 8.9    Recent Labs  10/29/12 0438  AST 15  ALT 26  ALKPHOS 53  BILITOT 0.2*  PROT 6.6  ALBUMIN 3.3*    Recent Labs  10/28/12 1714 10/29/12 0438  WBC 7.2 6.0  HGB 13.0 12.2  HCT 39.0 38.0  MCV 85.2 86.4  PLT 244 241   Lab Results  Component Value Date   INR 1.00 06/03/2012   INR 0.95 01/08/2012   INR 0.91 04/23/2011    Recent Labs  10/28/12 0144 10/29/12 0438 10/29/12 1040  CKTOTAL 118  --   --   CKMB 2.2  --   --   TROPONINI <0.30 <0.30 <0.30    Recent Labs  10/28/12 0144  TSH 0.167*   No results found for this basename: VITAMINB12, FOLATE, FERRITIN, TIBC, IRON, RETICCTPCT,  in the last 72 hours  Discharge instructions:      Future Appointments Provider Department Dept Phone   11/06/2012 8:15 AM [facility] Nuclear [facility]) [facility] SITE 3 NUCLEAR MED [phone]     01-Home or Self Care Follow-up Information   Follow up with [facility] On 11/06/2012. (At 8:15 AM for stress test. )    Contact information:   [address] [place] [phone]       Disposition: stable   Follow-up Appts: Follow-up with Dr. Link Snuffer at [facility] , you will be called with an appointment for next week.   You are scheduled for a stress test at [facility] on 7/16. Call their office with any questions. HOLD your coreg and isosorbide dinitrate dosing on 7/15  and 7/16. All other meds can be taken w/ small sip of water morning of test. No food after midnight on evening of 7/15. OK to drink clear liquids and broth up to 4 hours prior to exam.   Condition on Discharge: stable   Tests Needing Follow-up: BMP at general office visit next week   Time spent in discharge (includes decision making & examination of pt): 45 minutes    Signed: Stanislaus Kaltenbach 10/30/2012, 7:28 AM

## 2012-10-30 NOTE — Progress Notes (Signed)
[name], [name] [phone] 10/30/2012

## 2012-10-30 NOTE — Progress Notes (Signed)
Pt being dc to home, instructions, follow up appointments and medications reviewed, pt verbalized understanding, pt left via wheelchair, pt stable

## 2012-11-06 ENCOUNTER — Encounter (HOSPITAL_COMMUNITY): Payer: Medicare Other

## 2012-11-11 ENCOUNTER — Ambulatory Visit (HOSPITAL_COMMUNITY): Payer: PRIVATE HEALTH INSURANCE | Attending: Cardiovascular Disease | Admitting: Radiology

## 2012-11-11 VITALS — BP 116/75 | Ht 59.0 in | Wt 225.0 lb

## 2012-11-11 DIAGNOSIS — Z87891 Personal history of nicotine dependence: Secondary | ICD-10-CM | POA: Insufficient documentation

## 2012-11-11 DIAGNOSIS — R0609 Other forms of dyspnea: Secondary | ICD-10-CM | POA: Insufficient documentation

## 2012-11-11 DIAGNOSIS — J449 Chronic obstructive pulmonary disease, unspecified: Secondary | ICD-10-CM | POA: Insufficient documentation

## 2012-11-11 DIAGNOSIS — I10 Essential (primary) hypertension: Secondary | ICD-10-CM | POA: Insufficient documentation

## 2012-11-11 DIAGNOSIS — E119 Type 2 diabetes mellitus without complications: Secondary | ICD-10-CM | POA: Insufficient documentation

## 2012-11-11 DIAGNOSIS — R0989 Other specified symptoms and signs involving the circulatory and respiratory systems: Secondary | ICD-10-CM | POA: Insufficient documentation

## 2012-11-11 DIAGNOSIS — R42 Dizziness and giddiness: Secondary | ICD-10-CM | POA: Insufficient documentation

## 2012-11-11 DIAGNOSIS — R0602 Shortness of breath: Secondary | ICD-10-CM

## 2012-11-11 DIAGNOSIS — J4489 Other specified chronic obstructive pulmonary disease: Secondary | ICD-10-CM | POA: Insufficient documentation

## 2012-11-11 DIAGNOSIS — R002 Palpitations: Secondary | ICD-10-CM | POA: Insufficient documentation

## 2012-11-11 DIAGNOSIS — R079 Chest pain, unspecified: Secondary | ICD-10-CM | POA: Insufficient documentation

## 2012-11-11 MED ORDER — TECHNETIUM TC 99M SESTAMIBI GENERIC - CARDIOLITE
33.0000 | Freq: Once | INTRAVENOUS | Status: AC | PRN
  Administered 2012-11-11: 33 via INTRAVENOUS

## 2012-11-11 MED ORDER — REGADENOSON 0.4 MG/5ML IV SOLN
0.4000 mg | Freq: Once | INTRAVENOUS | Status: AC
  Administered 2012-11-11: 0.4 mg via INTRAVENOUS

## 2012-11-11 NOTE — Progress Notes (Signed)
[facility] SITE 3 NUCLEAR MED [address] [place], [place] [address] [phone]    Cardiology Nuclear Med Study  [name] is a 52 y.o. female     MRN : [id number]     DOB: [birth year]  Procedure Date: 11/11/2012  Nuclear Med Background Indication for Stress Test:  Evaluation for Ischemia History:  Asthma, COPD and '08 Heart Cath N[facility], 12/12 MPS: EF: 65% Decrease uptake along mid anterior wall on stress images  Ischemia vs. attenuation 9/13 ECHO: EF: 55-60% mild LVH 7/14 (-) CT (-) PE  Diastolic CHF H/O PE  Cardiac Risk Factors: History of Smoking, Hypertension, Lipids and NIDDM  Symptoms:  Chest Pain, Dizziness, DOE, Fatigue, Light-Headedness, Palpitations and SOB   Nuclear Pre-Procedure Caffeine/Decaff Intake:  None NPO After: 10:00pm   Lungs:  clear O2 Sat: 95% on room air. IV 0.9% NS with Angio Cath:  22g  IV Site: R Antecubital x 1 ,tolerated well IV Started by:  Irean Hong, RN  Chest Size (in):  44 Cup Size: D  Height: 4\' 11"  (1.499 m)  Weight:  225 lb (102.059 kg)  BMI:  Body mass index is 45.42 kg/(m^2). Tech Comments: Held  Coreg x 22 hrs ago    Nuclear Med Study 1 or 2 day study: 1 day  Stress Test Type:  Treadmill/Lexiscan  Reading MD: Olga Millers, MD  Order Authorizing Provider:  Kristeen Miss MD  Resting Radionuclide: Technetium 98m Sestamibi  Resting Radionuclide Dose: 33.0 mCi  11/14/12  Stress Radionuclide:  Technetium 42m Sestamibi  Stress Radionuclide Dose: 33.0 mCi  11/11/12          Stress Protocol Rest HR: 66 Stress HR: 106  Rest BP: 116/75 Stress BP: 119/64  Exercise Time (min): n/a METS: n/a   Predicted Max HR: 167 bpm % Max HR: 63.47 bpm Rate Pressure Product: 78469   Dose of Adenosine (mg):  n/a Dose of Lexiscan: 0.4 mg  Dose of Atropine (mg): n/a Dose of Dobutamine: n/a mcg/kg/min (at max HR)  Stress Test Technologist: Milana Na, EMT-P  Nuclear Technologist:  Domenic Polite, CNMT     Rest Procedure:   Myocardial perfusion imaging was performed at rest 45 minutes following the intravenous administration of Technetium 91m Sestamibi. Rest ECG: NSR [id number]  Stress Procedure:  The patient received IV Lexiscan 0.4 mg over 15-seconds with concurrent low level exercise and then Technetium 36m Sestamibi was injected at 30-seconds while the patient continued walking one more minute.  This patient had chest pressure sob, lt. Headed, and a headache, with the Lexiscan injection.  Quantitative spect images were obtained after a 45-minute delay. Stress ECG: No significant change from baseline ECG  QPS Raw Data Images:  There is a breast shadow that accounts for the anterior attenuation. Stress Images:  There is mild apical thinning with normal uptake in other regions. Rest Images:  Comparison with the stress images reveals no significant change. Subtraction (SDS):  Abnormal in apex Transient Ischemic Dilatation (Normal <1.22):  n/a Lung/Heart Ratio (Normal <0.45):  0.44  Quantitative Gated Spect Images QGS EDV:  95 ml QGS ESV:  32 ml  Impression Exercise Capacity:  Lexiscan with no exercise. BP Response:  Normal blood pressure response. Clinical Symptoms:  There is dyspnea. ECG Impression:  No significant ST segment change suggestive of ischemia. Comparison with Prior Nuclear Study: No images to compare  Overall Impression:  Low risk stress nuclear study Significant breast shadow on RAW images. Small area of apical ischemia may  be realted to shiifting breast artifact.  LV Ejection Fraction: 66%.  LV Wall Motion:  NL LV Function; NL Wall Motion  Charlton Haws

## 2012-11-14 ENCOUNTER — Ambulatory Visit (HOSPITAL_COMMUNITY): Payer: Medicare Other | Attending: Cardiovascular Disease

## 2012-11-14 DIAGNOSIS — R0989 Other specified symptoms and signs involving the circulatory and respiratory systems: Secondary | ICD-10-CM

## 2012-11-14 MED ORDER — TECHNETIUM TC 99M SESTAMIBI GENERIC - CARDIOLITE
33.0000 | Freq: Once | INTRAVENOUS | Status: AC | PRN
  Administered 2012-11-14: 33 via INTRAVENOUS

## 2012-11-25 DIAGNOSIS — I2510 Atherosclerotic heart disease of native coronary artery without angina pectoris: Secondary | ICD-10-CM | POA: Insufficient documentation

## 2012-12-02 IMAGING — CR DG CHEST 1V PORT
1 series · 1 of 1 positions shown · non-contrast
Comparison: 05/30/2010

CLINICAL DATA: Shortness of breath, chest pain.

PORTABLE CHEST - 1 VIEW

[view not recorded]
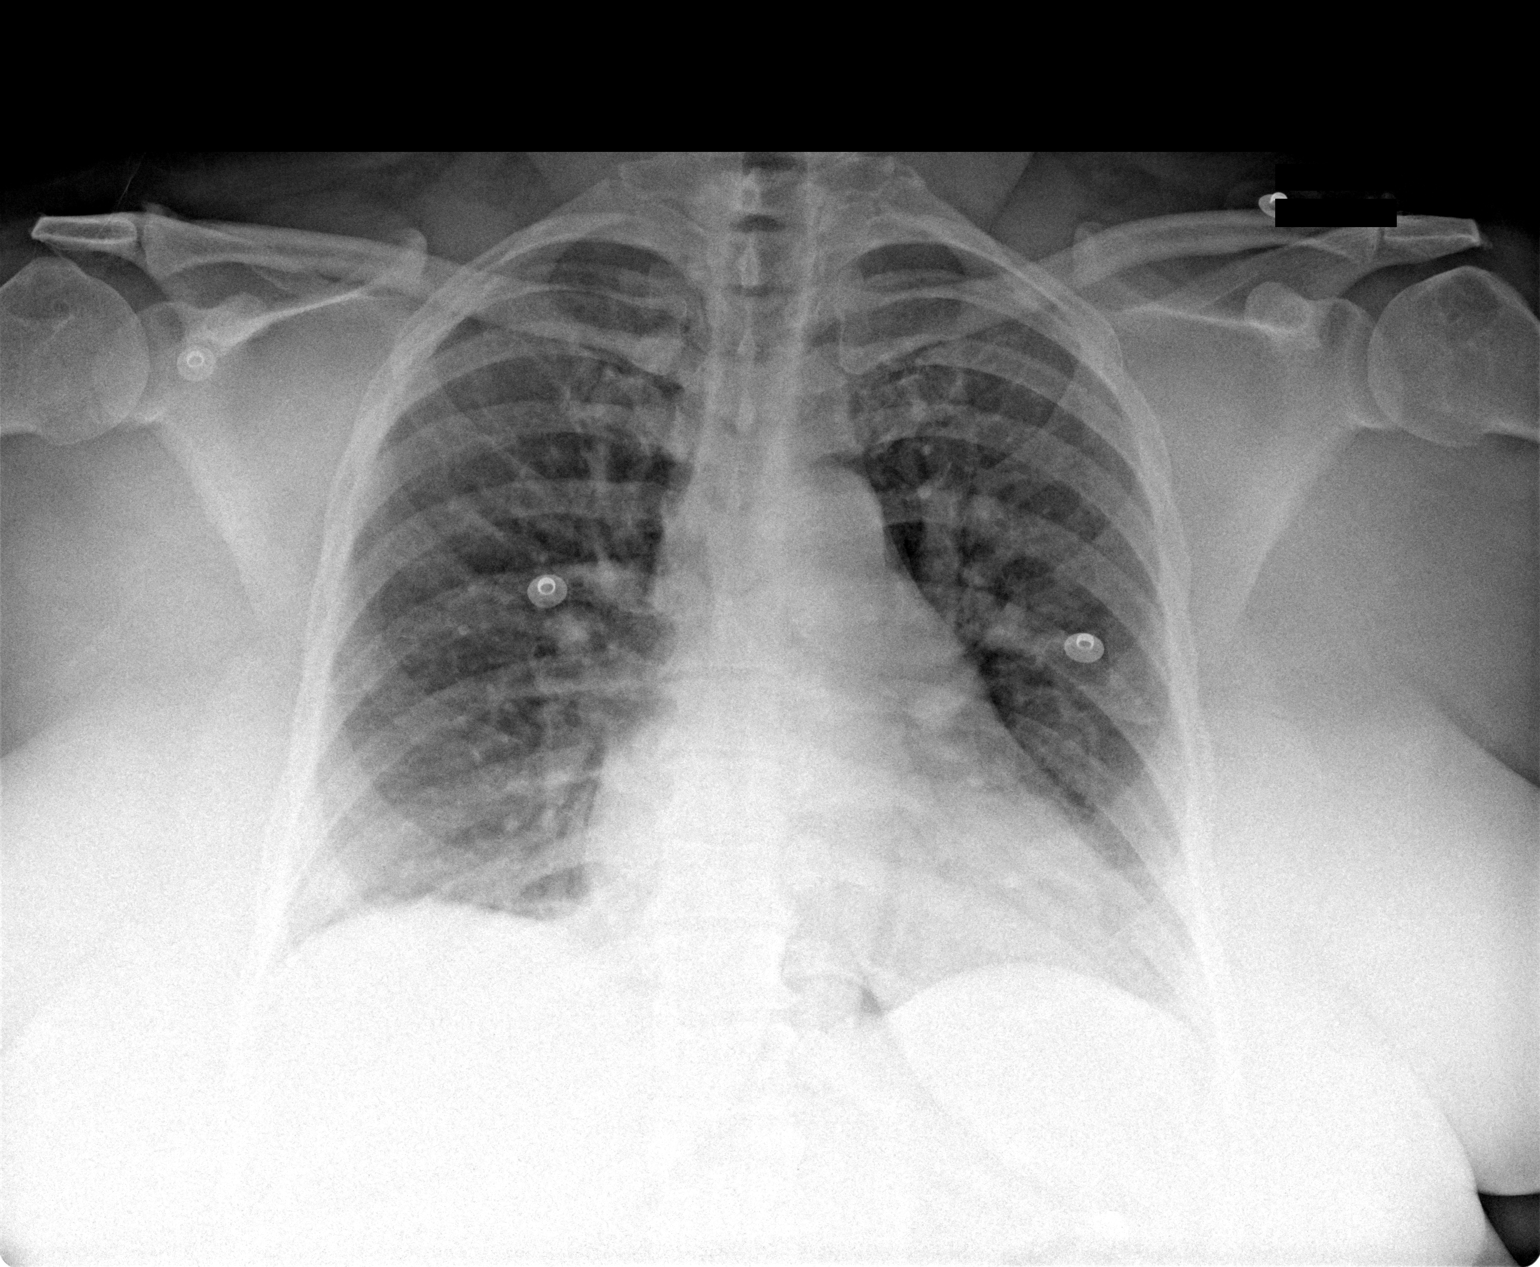

[1 of 1 positions shown; findings below may reference images not displayed]

FINDINGS: Heart is borderline in size.  Mild diffuse interstitial
prominence, similar to prior study.  No confluent opacities or
effusions.
IMPRESSION: Stable borderline heart size and diffuse interstitial prominence,
possibly chronic interstitial lung disease.

## 2013-01-02 ENCOUNTER — Other Ambulatory Visit: Payer: Self-pay | Admitting: Orthopedic Surgery

## 2013-01-02 DIAGNOSIS — R531 Weakness: Secondary | ICD-10-CM

## 2013-01-02 DIAGNOSIS — M25519 Pain in unspecified shoulder: Secondary | ICD-10-CM

## 2013-01-02 DIAGNOSIS — R202 Paresthesia of skin: Secondary | ICD-10-CM

## 2013-01-02 DIAGNOSIS — M542 Cervicalgia: Secondary | ICD-10-CM

## 2013-01-09 ENCOUNTER — Ambulatory Visit
Admission: RE | Admit: 2013-01-09 | Discharge: 2013-01-09 | Disposition: A | Discharge status: Home / Self Care | Payer: Medicare Other | Source: Ambulatory Visit | Attending: Orthopedic Surgery | Admitting: Orthopedic Surgery

## 2013-01-09 DIAGNOSIS — R531 Weakness: Secondary | ICD-10-CM

## 2013-01-09 DIAGNOSIS — M542 Cervicalgia: Secondary | ICD-10-CM

## 2013-01-09 DIAGNOSIS — R202 Paresthesia of skin: Secondary | ICD-10-CM

## 2013-01-09 DIAGNOSIS — M25519 Pain in unspecified shoulder: Secondary | ICD-10-CM

## 2013-01-17 ENCOUNTER — Other Ambulatory Visit: Payer: Self-pay | Admitting: Internal Medicine

## 2013-01-17 DIAGNOSIS — D352 Benign neoplasm of pituitary gland: Secondary | ICD-10-CM

## 2013-01-17 IMAGING — CR DG CHEST 2V
2 series · 2 of 2 positions shown · non-contrast
Comparison: 01/28/2011

CLINICAL DATA: Chest pain and shortness of breath.

CHEST - 2 VIEW

[w chest pa]
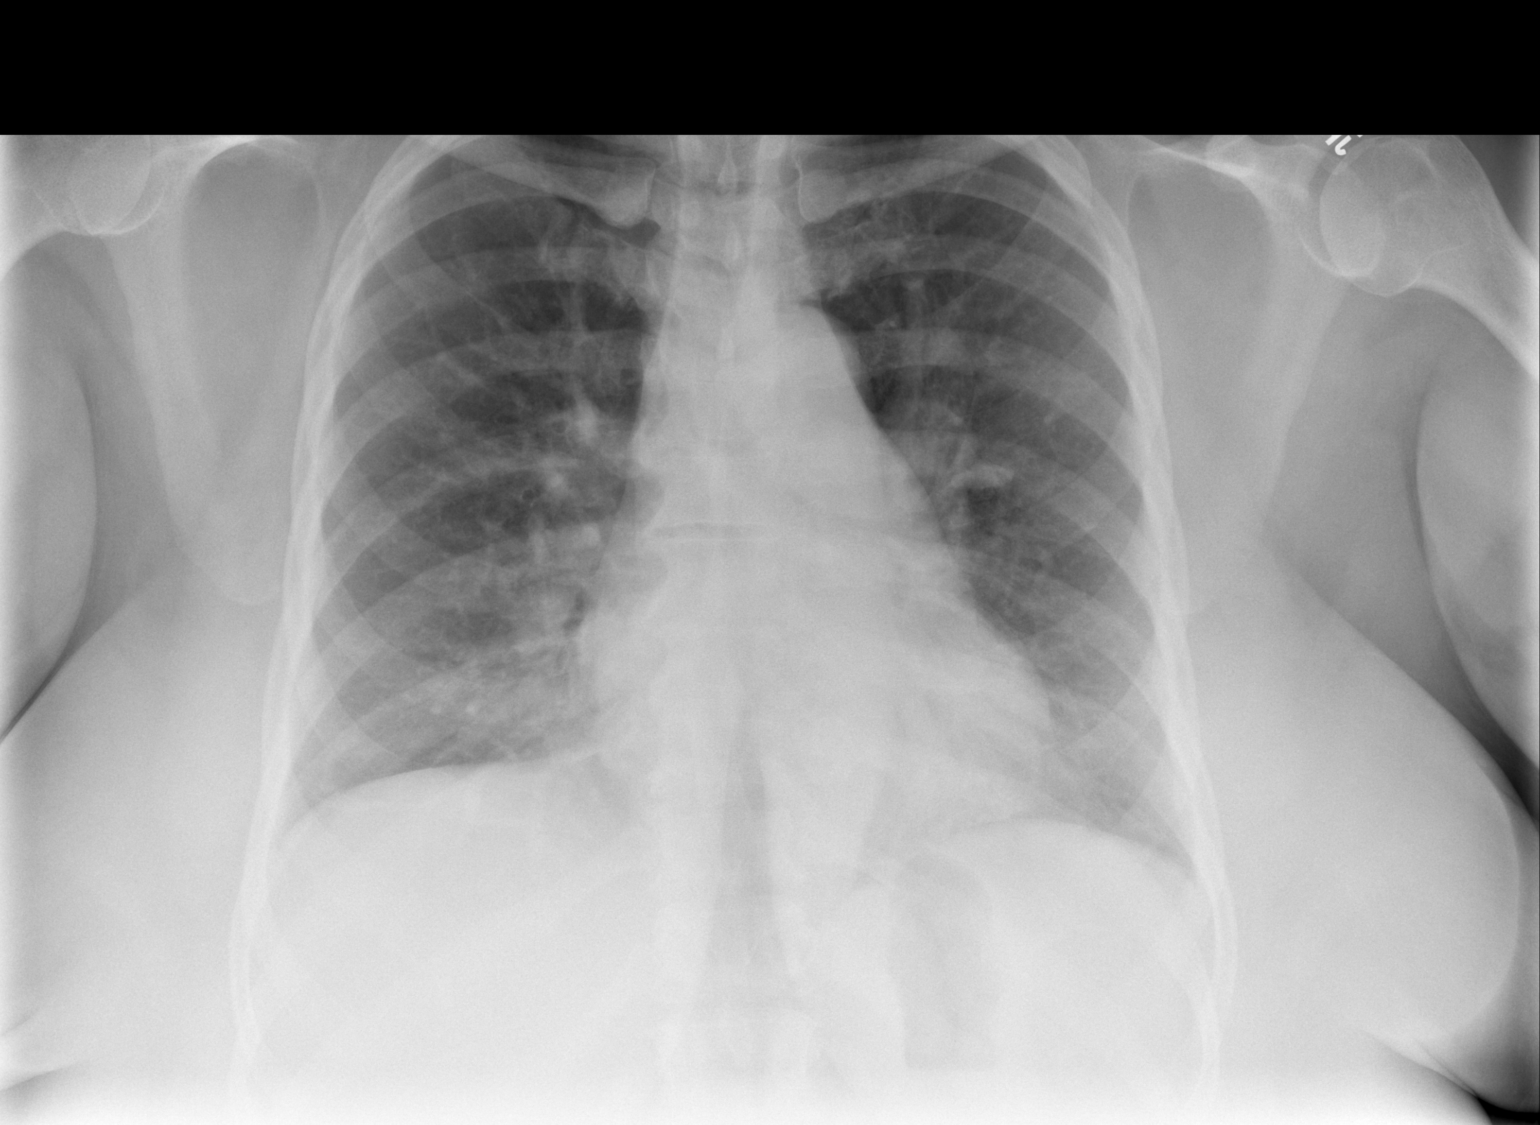

[w chest lat]
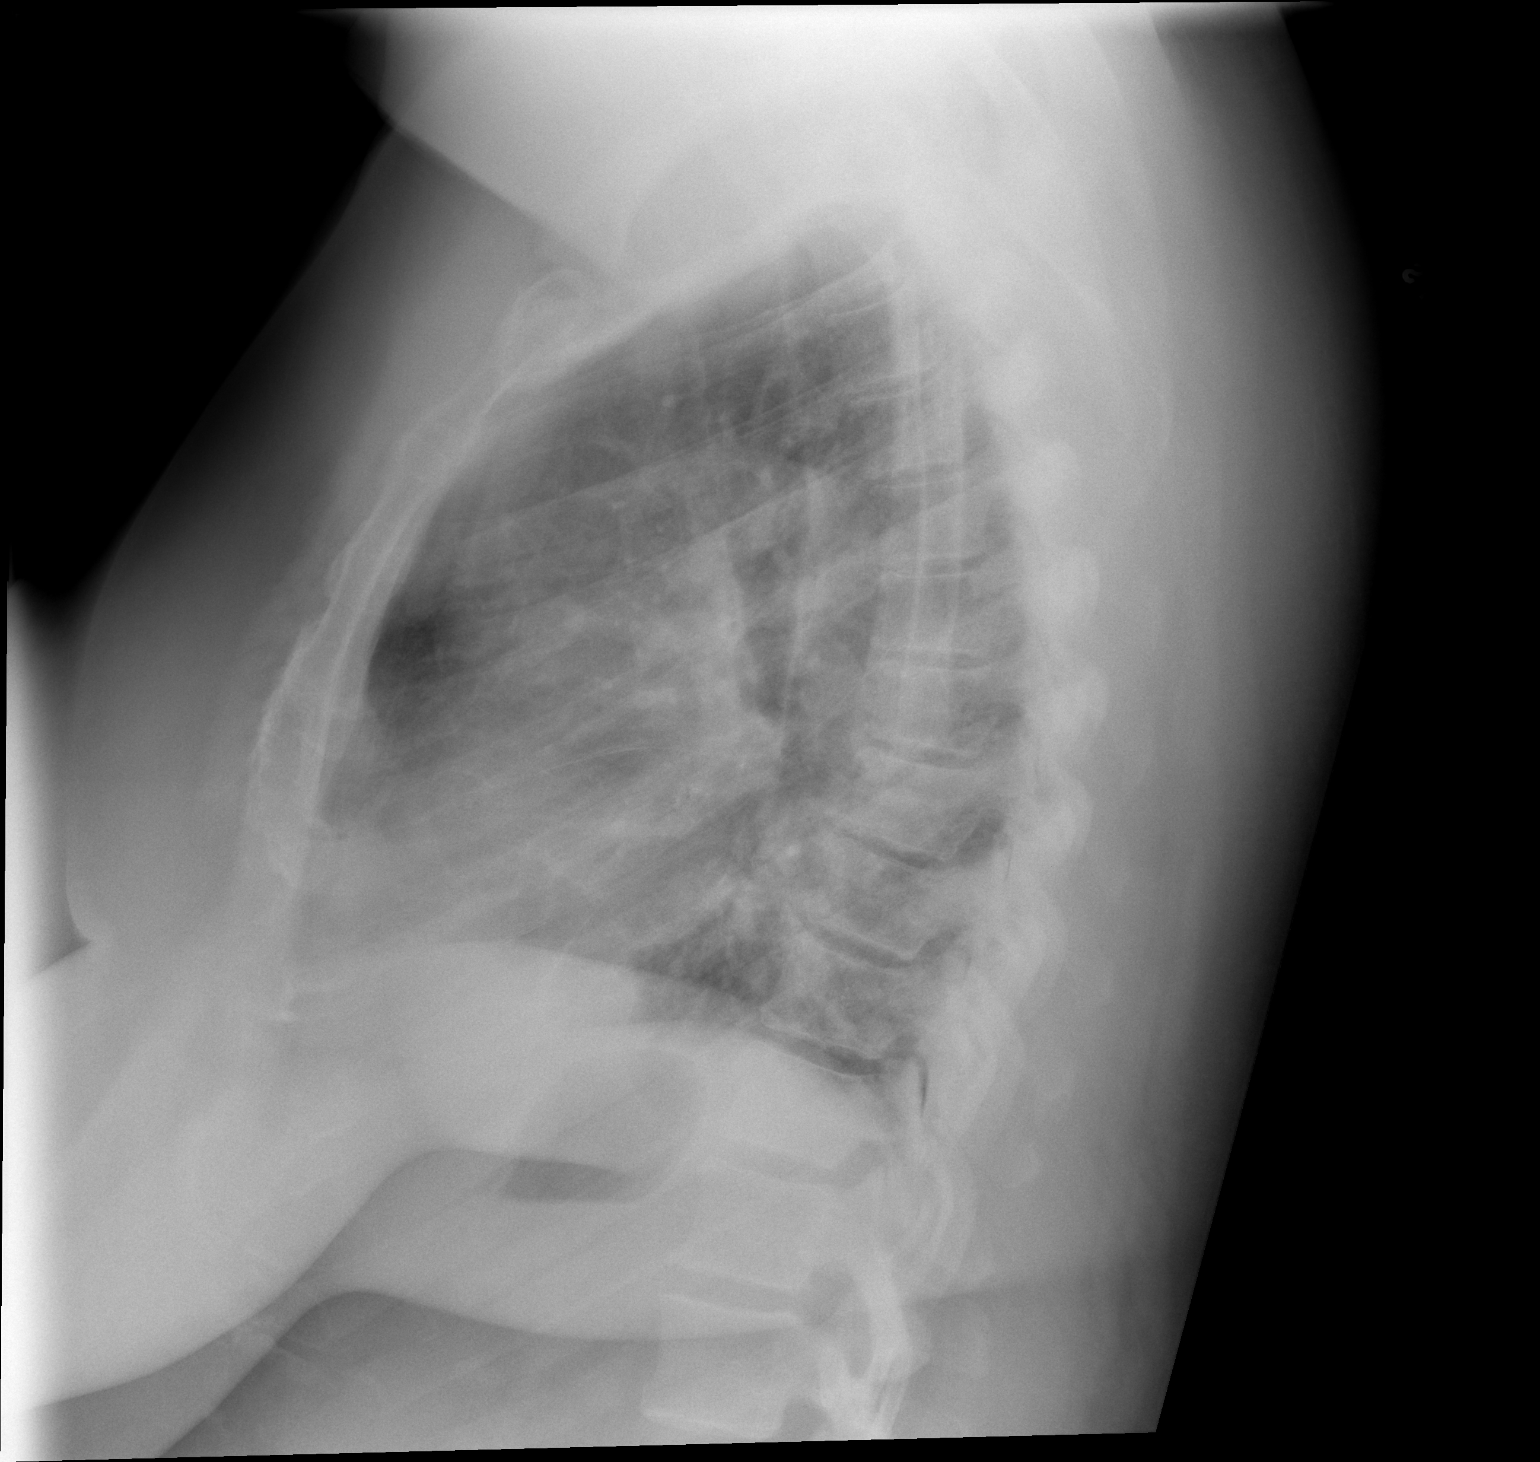

[2 of 2 positions shown; findings below may reference images not displayed]

FINDINGS: Two views of the chest demonstrate slightly prominent
central vascular markings and prominent basilar lung markings.
Heart size is within normal limits.  There is no focal airspace
disease.  Bony structures appear intact.
IMPRESSION: Slightly prominent vascular markings, particularly at
the lung bases.  There is no evidence for frank pulmonary edema or
airspace disease.

## 2013-01-17 IMAGING — CT CT ANGIO CHEST
2 of 8 series · 13 of 36 positions shown · IV contrast (agent unspecified)
Comparison: CT 01/14/2011

CLINICAL DATA: Chest pain and evaluate for pulmonary embolism.

CT ANGIOGRAPHY CHEST WITH CONTRAST
TECHNIQUE: Multidetector CT imaging of the chest was performed
using the standard protocol during bolus administration of
intravenous contrast.  Multiplanar CT image reconstructions
including MIPs were obtained to evaluate the vascular anatomy.
Contrast:  70 ml Hmnipaque-4RR

[Series 5: pe thins · axial · 0.68mm/px · z∈[-234,-41]mm · 12 of 229 slices shown]
[im 18/229  lung]
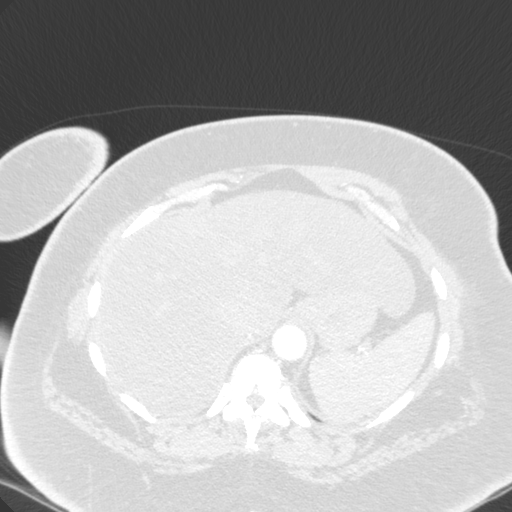
[im 36/229  mediastinal]
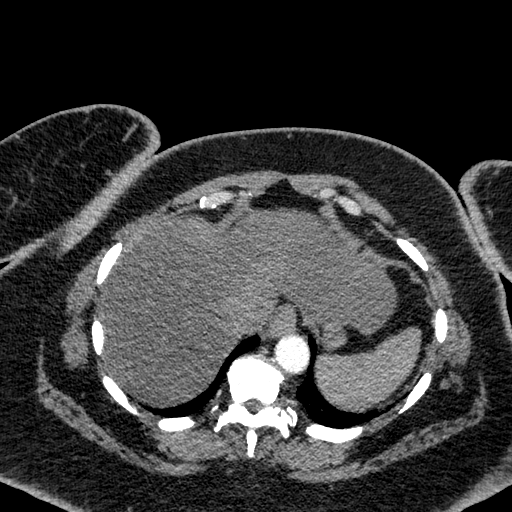
[im 53/229  lung]
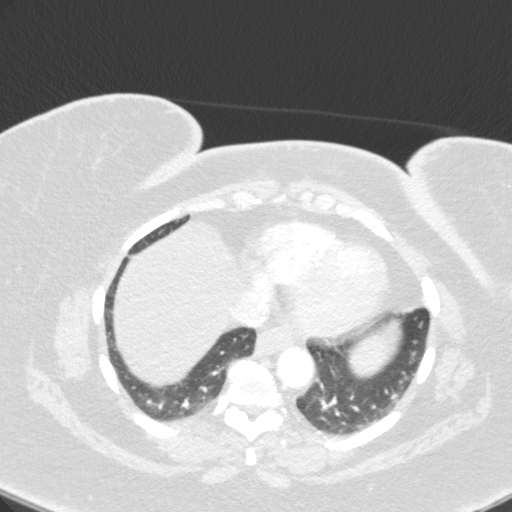
[im 71/229  mediastinal]
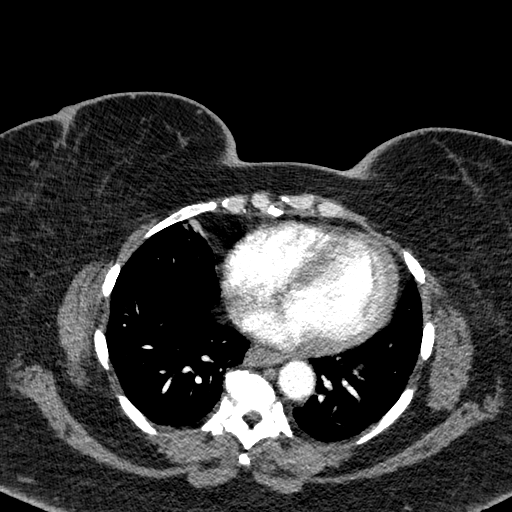
[im 88/229  lung]
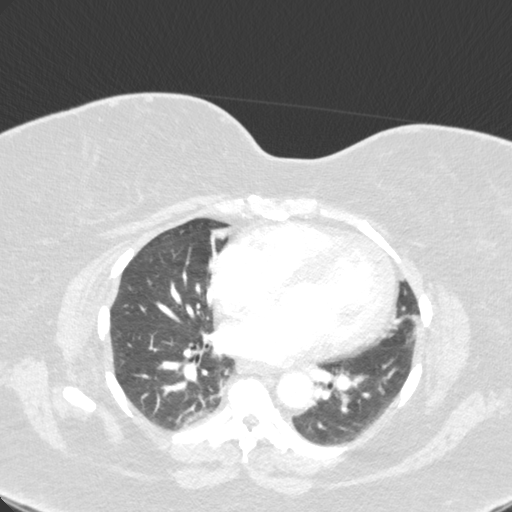
[im 106/229  mediastinal]
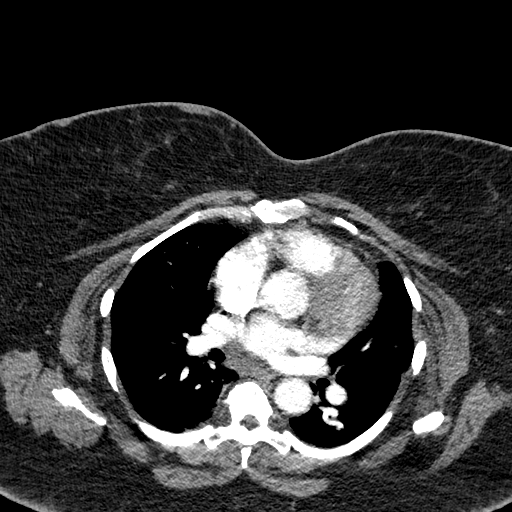
[im 123/229  lung]
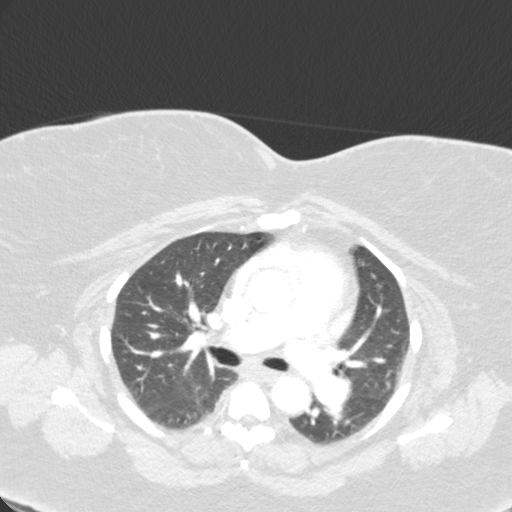
[im 141/229  mediastinal]
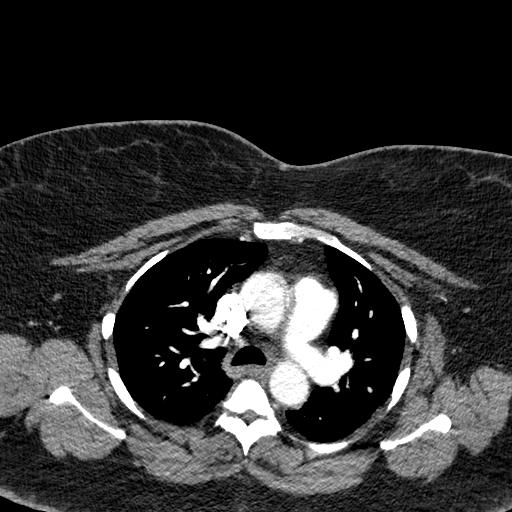
[im 158/229  lung]
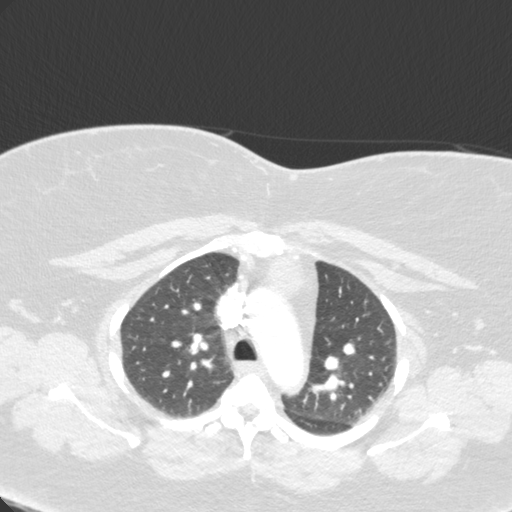
[im 176/229  mediastinal]
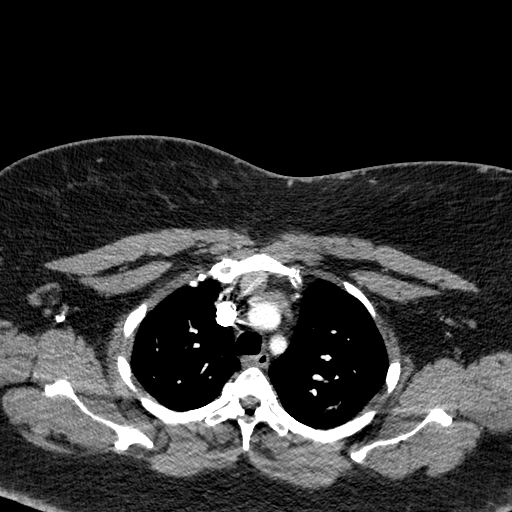
[im 193/229  lung]
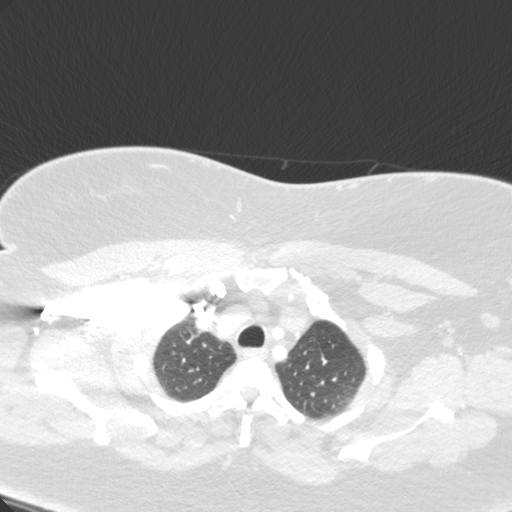
[im 211/229  mediastinal]
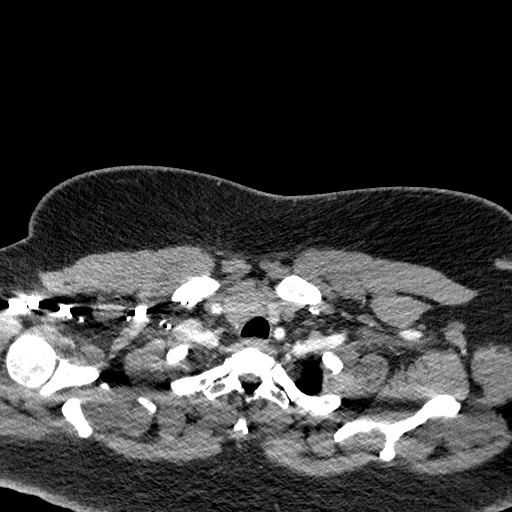

[mpr coronals · coronal · 0.68mm/px · 1 of 95 slices shown]
[im 48/95  mediastinal]
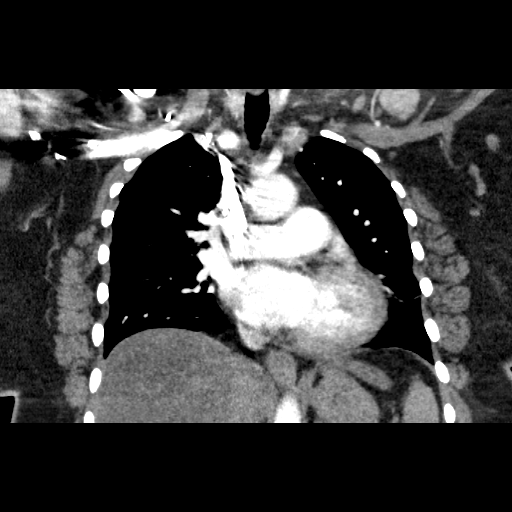

[13 of 36 positions shown; findings below may reference images not displayed]

FINDINGS: No evidence for pulmonary embolism.  The thoracic aorta
and great vessels are patent.  No evidence of pericardial or
pleural fluid.  Low density in the liver could represent hepatic
steatosis.  Again noted is a round low density structure in the
anterior mediastinum that roughly measures 2.5 x 2.6 cm.  Structure
measures 2.6 cm in the craniocaudal dimension which is unchanged.
This lesion appears to be stable.  There is an adjacent hyperdense
nodule posterior to the sternum.  The structure measures 1.5 cm in
the AP dimension. Again seen is a fullness of the thyroid tissue.

The trachea and mainstem bronchi are patent.  Lungs are clear
bilaterally. No acute bony abnormality.

Review of the MIP images confirms the above findings.
IMPRESSION: No evidence for a pulmonary embolism.

No acute chest findings.

Minimal change in the anterior mediastinal nodules.  These nodules
have been present since 0119 and minimally enlarged in the interim.

Probable hepatic steatosis.

## 2013-01-26 ENCOUNTER — Ambulatory Visit
Admission: RE | Admit: 2013-01-26 | Discharge: 2013-01-26 | Disposition: A | Discharge status: Home / Self Care | Payer: Medicare Other | Source: Ambulatory Visit | Attending: Internal Medicine | Admitting: Internal Medicine

## 2013-01-26 DIAGNOSIS — D352 Benign neoplasm of pituitary gland: Secondary | ICD-10-CM

## 2013-01-26 MED ORDER — GADOBENATE DIMEGLUMINE 529 MG/ML IV SOLN
10.0000 mL | Freq: Once | INTRAVENOUS | Status: AC | PRN
  Administered 2013-01-26: 10 mL via INTRAVENOUS

## 2013-02-24 IMAGING — CT CT ANGIO CHEST
2 of 6 series · 19 of 46 positions shown · IV contrast (APPLIED)
Comparison: Prior CT angiogram of the chest from last month; also
chest radiograph from 04/22/2011

CLINICAL DATA: Chest pain radiating through to the back.
Shortness of breath.  Nausea.

CT ANGIOGRAPHY CHEST WITH CONTRAST
TECHNIQUE: Multidetector CT imaging of the chest was performed
using the standard protocol during bolus administration of
intravenous contrast.  Multiplanar CT image reconstructions
including MIPs were obtained to evaluate the vascular anatomy.
Contrast: 100mL OMNIPAQUE IOHEXOL 350 MG/ML IV SOLN

[Series 8: pulm embolism 1.0 b25f thin · axial · 0.70mm/px · z∈[-286,-14]mm · 16 of 301 slices shown]
[im 14/301  lung]
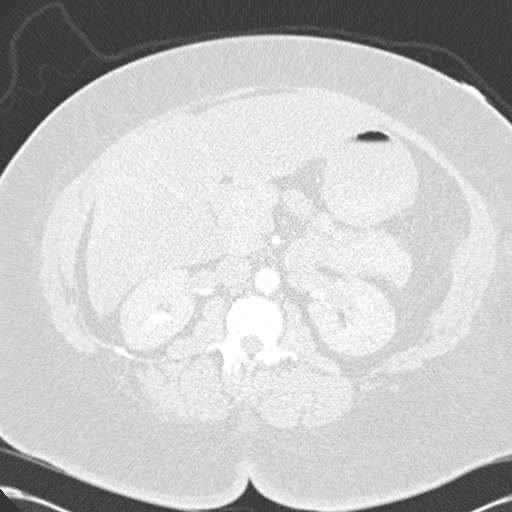
[im 40/301  soft-tissue]
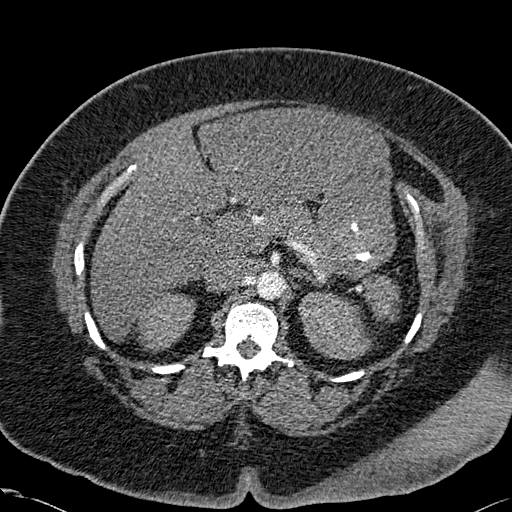
[im 53/301  lung]
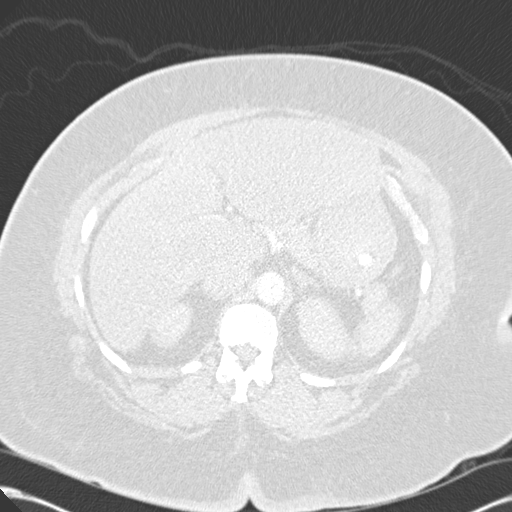
[im 66/301  soft-tissue]
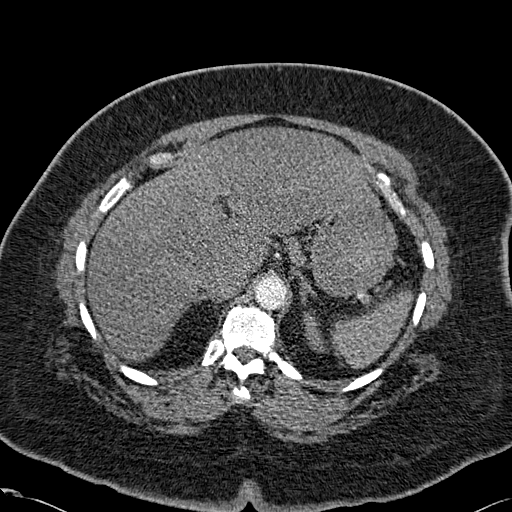
[im 92/301  lung]
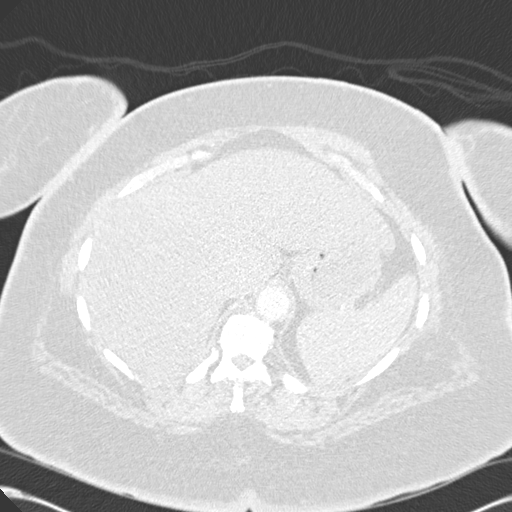
[im 105/301  soft-tissue]
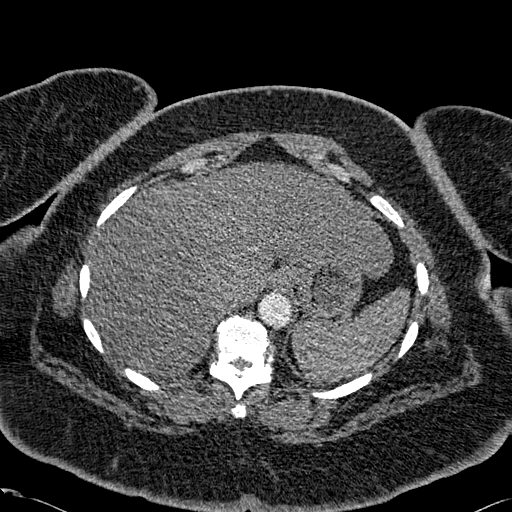
[im 118/301  lung]
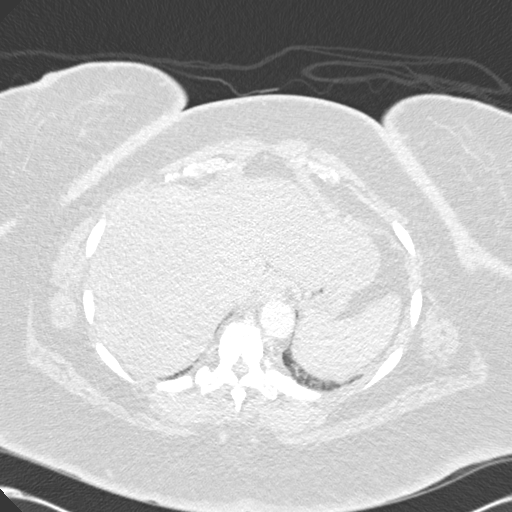
[im 144/301  soft-tissue]
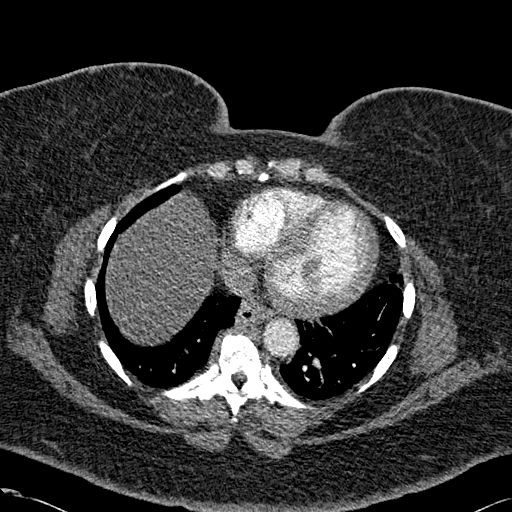
[im 157/301  lung]
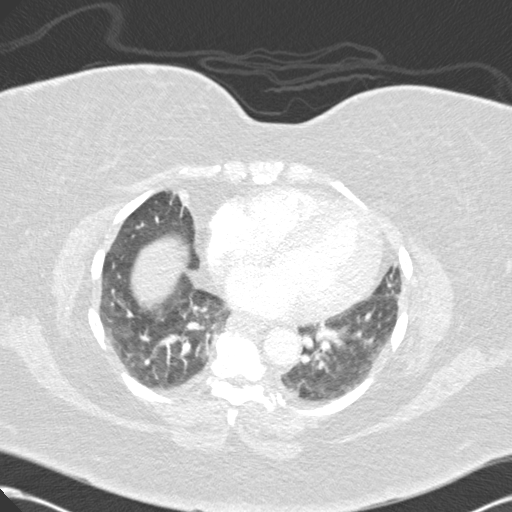
[im 183/301  soft-tissue]
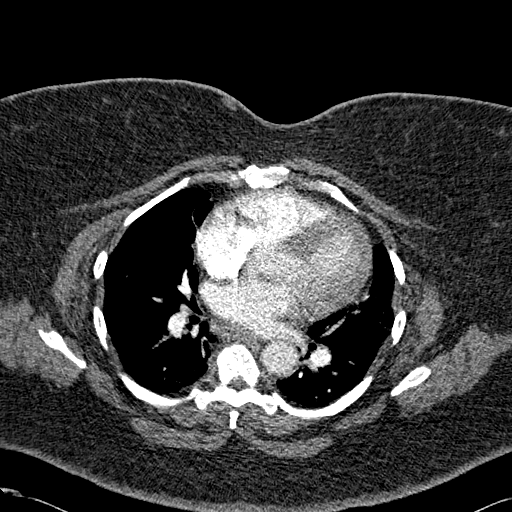
[im 196/301  lung]
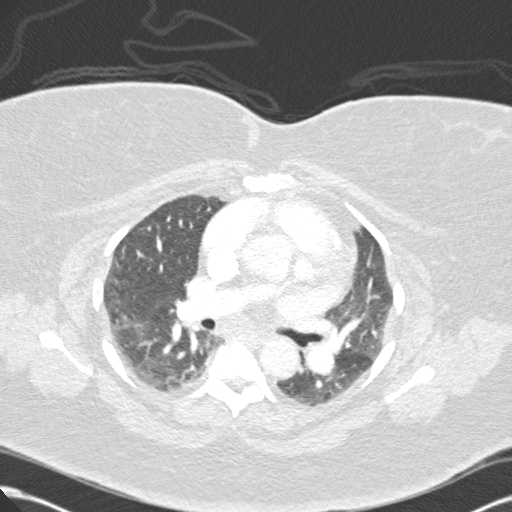
[im 209/301  soft-tissue]
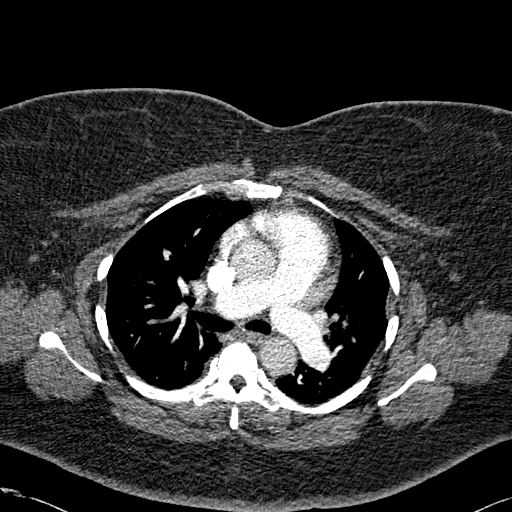
[im 235/301  lung]
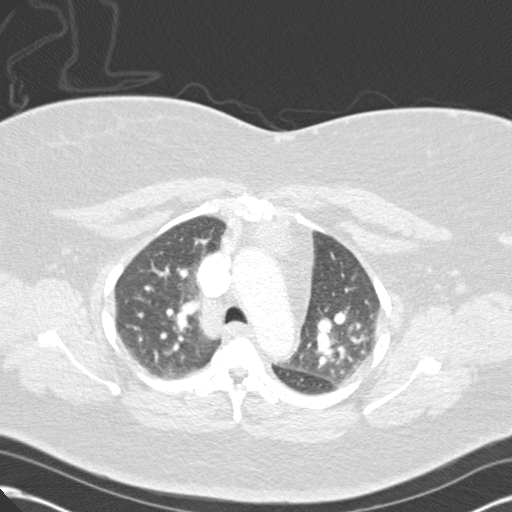
[im 248/301  soft-tissue]
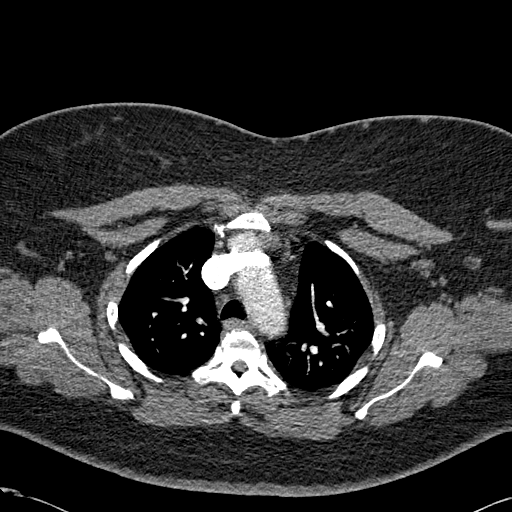
[im 261/301  lung]
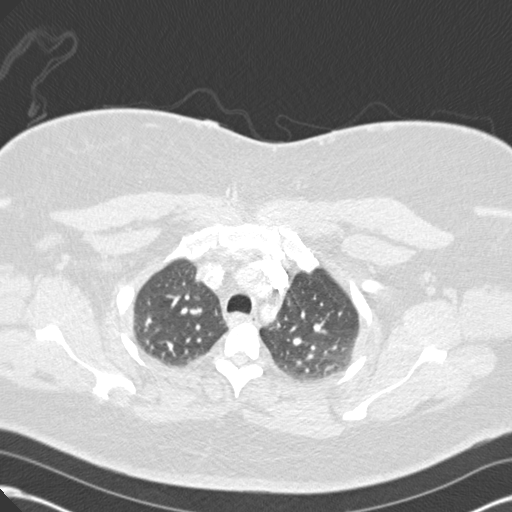
[im 287/301  soft-tissue]
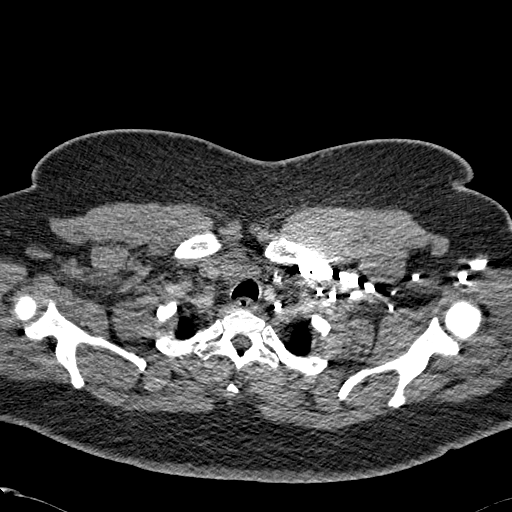

[Series 602: <mpr thick cor · coronal · 0.70mm/px · 3 of 108 slices shown]
[im 27/108  soft-tissue]
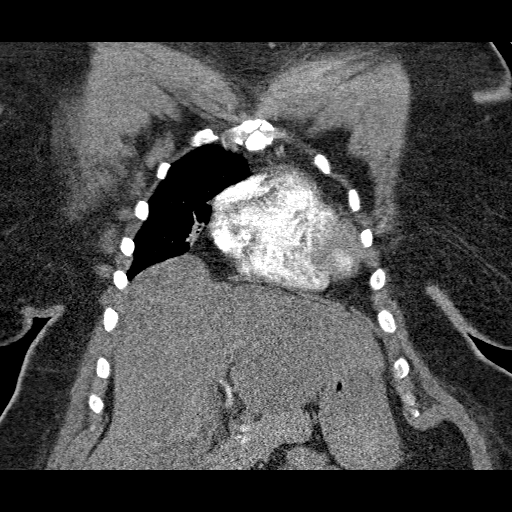
[im 54/108  soft-tissue]
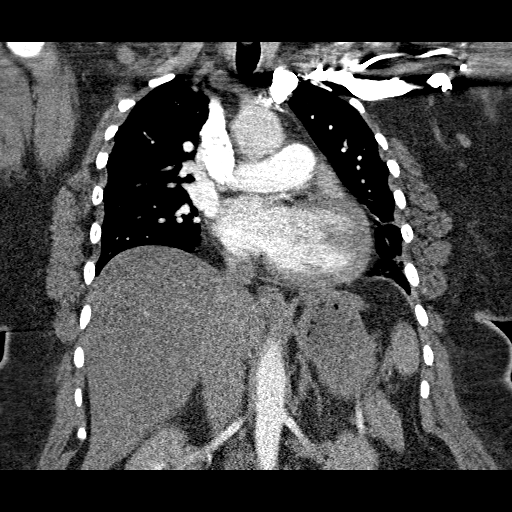
[im 81/108  soft-tissue]
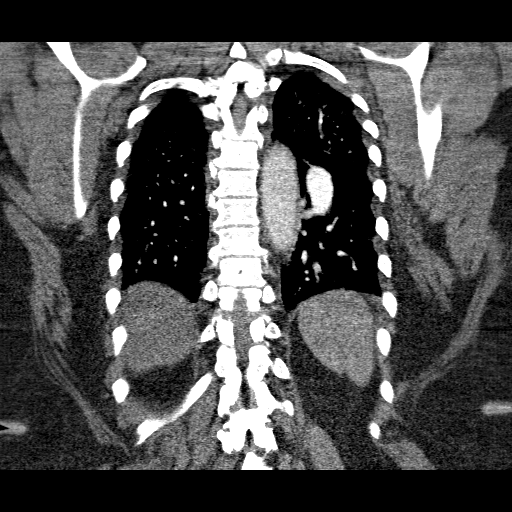

[19 of 46 positions shown; findings below may reference images not displayed]

FINDINGS: Technical factors related to patient body habitus reduce
diagnostic sensitivity and specificity.

No filling defect is identified in the pulmonary arterial tree to
suggest pulmonary embolus.

Mild cardiomegaly noted.  A low density anterior mediastinal node
measures 2.4 cm in short axis on image 22 of series 6 (formerly
cm).  Another anterior mediastinal node measures 1.4 cm in short
axis (formerly 1.5 cm.).  Similar findings have been noted back
through the CT chest from 7117.  Comparing back to the exam from
02/19/2003, the lesions have mildly increased in size in the
intervening 8 years.

Diffuse hepatic steatosis noted.

Review of the MIP images confirms the above findings.
IMPRESSION: 1.  No embolus is observed
2.  Stable anterior mediastinal nodes have been noted since 7117
and have increased slightly in size compared to 6555.  Differential
diagnostic considerations include chronic adenopathy, germ cell
tumor, or thymoma, but the minimal increase in size over 8 years
argues against an aggressive process.
3.  Mild cardiomegaly.
4.  Diffuse hepatic steatosis.

## 2013-02-24 IMAGING — CR DG CHEST 2V
2 series · 2 of 2 positions shown · non-contrast
Comparison: 03/15/2011

CLINICAL DATA: Chest pain, shortness of breath

CHEST - 2 VIEW

[w chest pa]
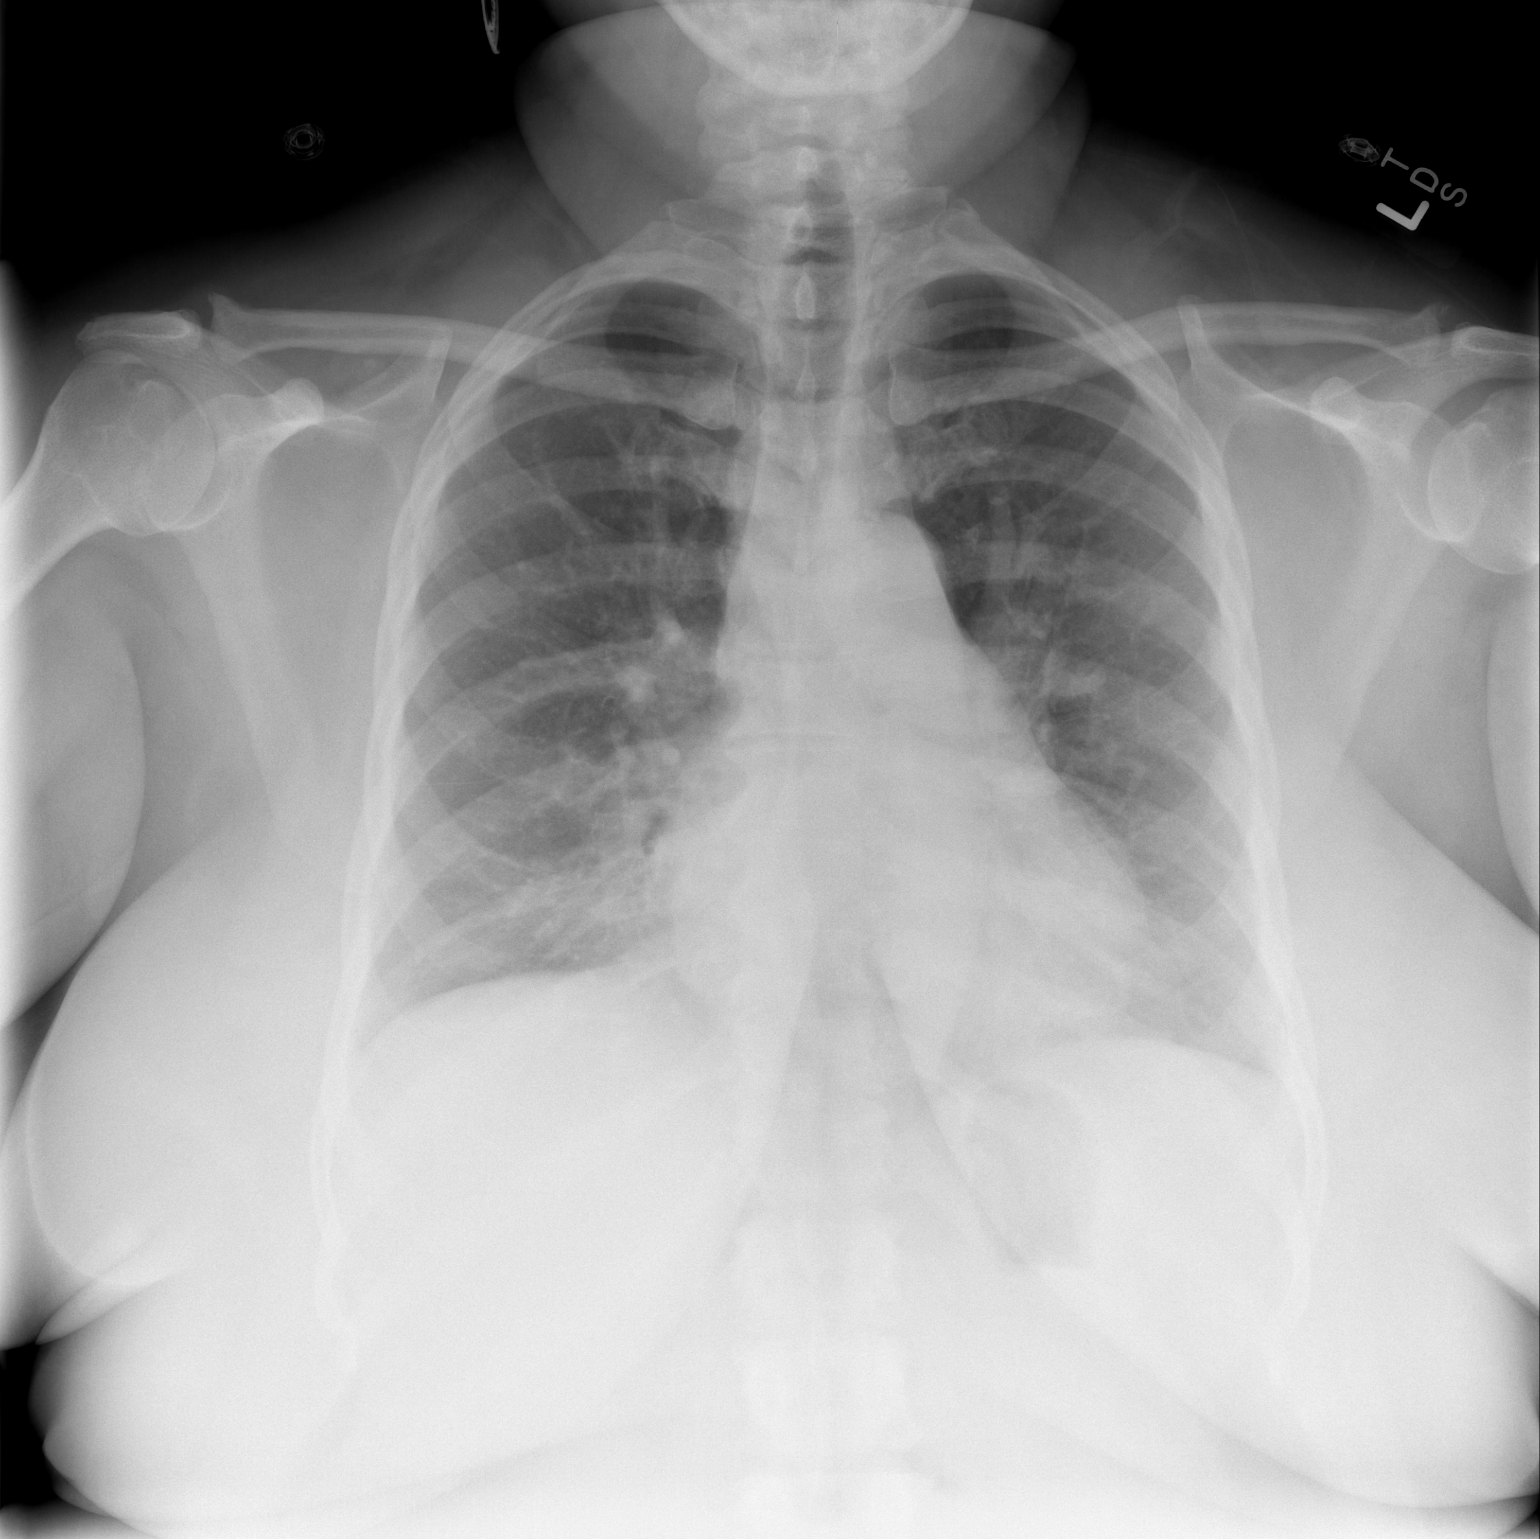

[w chest lat]
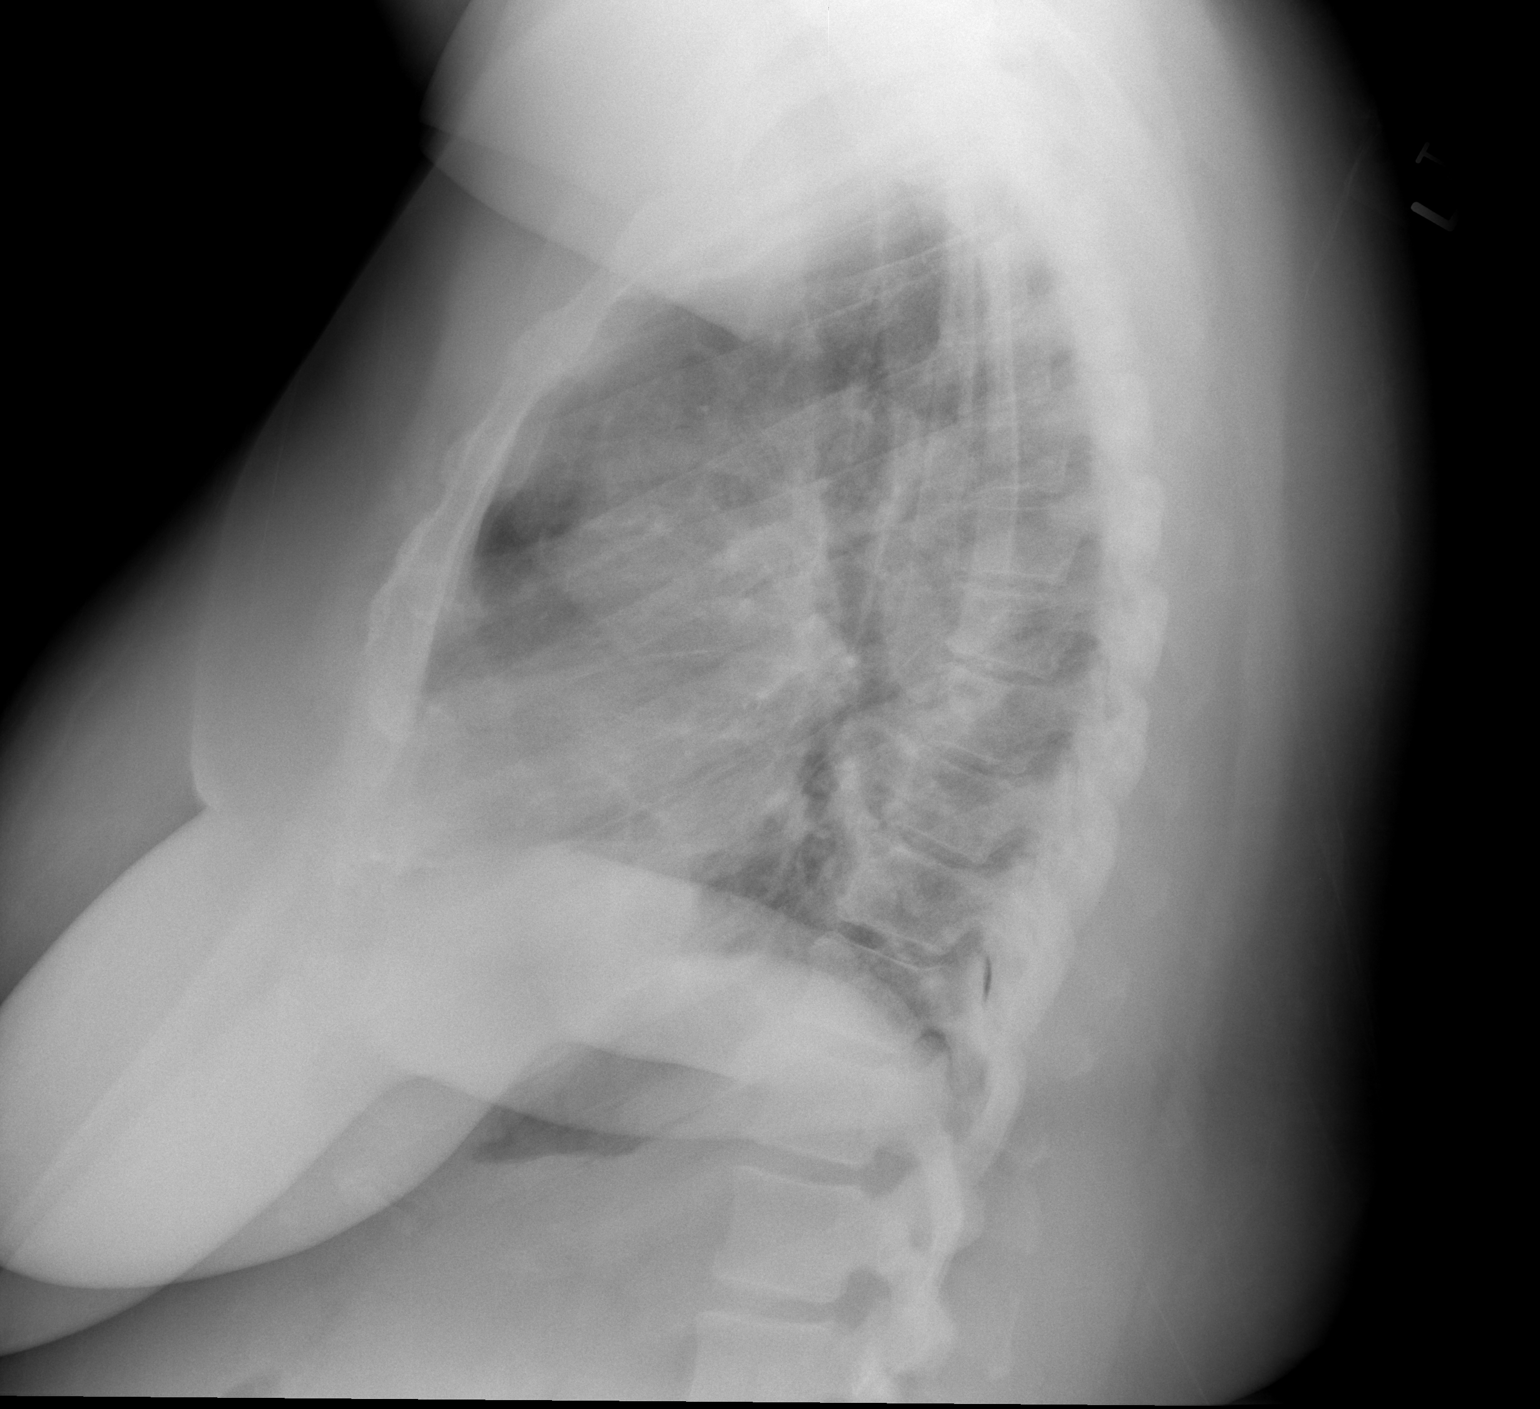

[2 of 2 positions shown; findings below may reference images not displayed]

FINDINGS: Cardiomediastinal silhouette is stable.  Central mild
vascular congestion without convincing pulmonary edema.  Stable
bilateral basilar crowding of vascular markings.  Stable mild
degenerative changes thoracic spine.  No segmental infiltrate.
IMPRESSION: Central mild vascular congestion without convincing pulmonary
edema.  Stable bilateral basilar crowding of vascular markings. No
focal infiltrate.

## 2013-03-14 ENCOUNTER — Other Ambulatory Visit: Payer: Self-pay | Admitting: Internal Medicine

## 2013-03-14 ENCOUNTER — Ambulatory Visit
Admission: RE | Admit: 2013-03-14 | Discharge: 2013-03-14 | Disposition: A | Discharge status: Home / Self Care | Payer: PRIVATE HEALTH INSURANCE | Source: Ambulatory Visit | Attending: Internal Medicine | Admitting: Internal Medicine

## 2013-03-14 DIAGNOSIS — M79672 Pain in left foot: Secondary | ICD-10-CM

## 2013-04-10 IMAGING — CT CT ANGIO CHEST
2 of 6 series · 18 of 46 positions shown · IV contrast (APPLIED)
Comparison: Chest CTA -  04/22/2011; 03/15/2011; 01/14/2011;
08/20/2003

CLINICAL DATA: Pressure, now with chest pain, radiating to back,
evaluate for aortic dissection.

CT ANGIOGRAPHY CHEST WITH AND WITHOUT CONTRAST
TECHNIQUE: Multidetector CT imaging of the chest using the
standard protocol during bolus administration of intravenous
contrast. Multiplanar reconstructed images including MIPs were
obtained and reviewed to evaluate the vascular anatomy.
Contrast: 100mL OMNIPAQUE IOHEXOL 350 MG/ML IV SOLN

[Series 6: dissection 2.0 st · axial · 0.70mm/px · z∈[-332,-68]mm · 15 of 146 slices shown]
[im 7/146  lung]
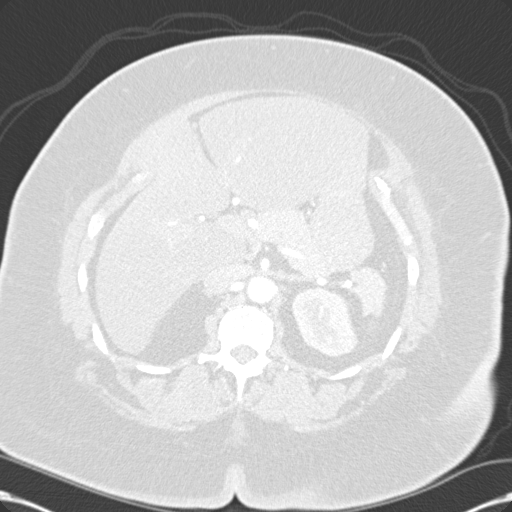
[im 20/146  soft-tissue]
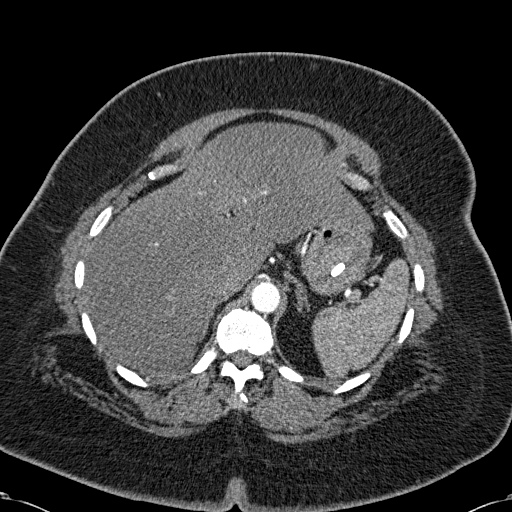
[im 27/146  lung]
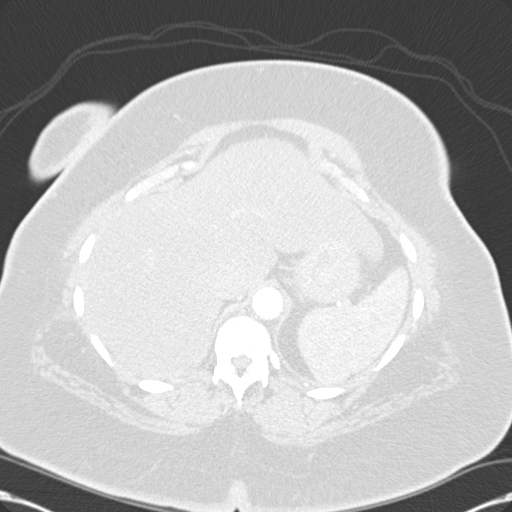
[im 33/146  soft-tissue]
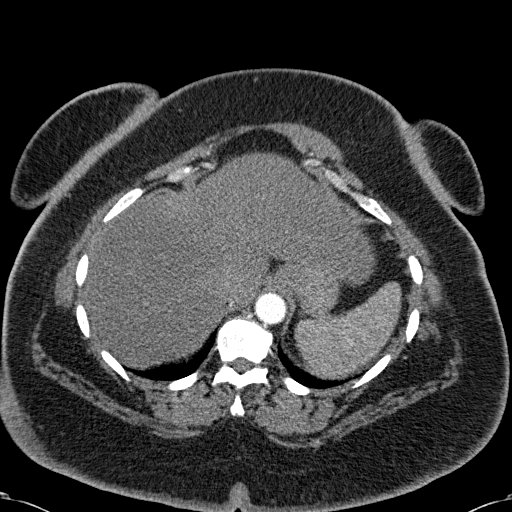
[im 47/146  lung]
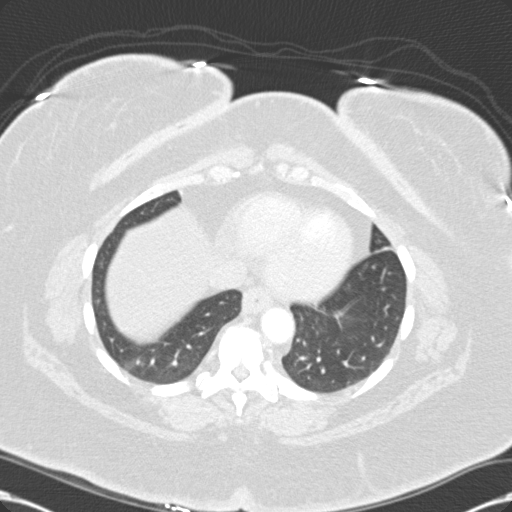
[im 53/146  soft-tissue]
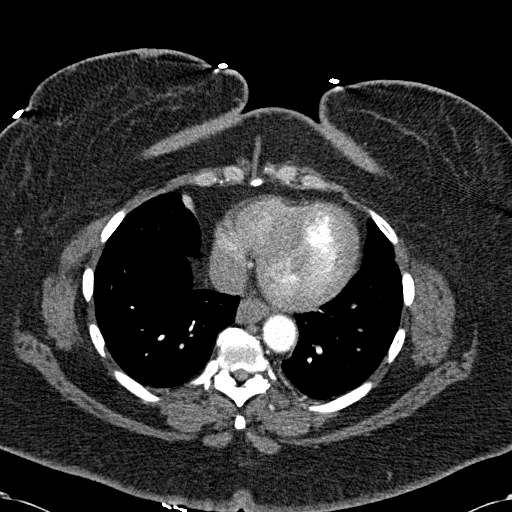
[im 66/146  lung]
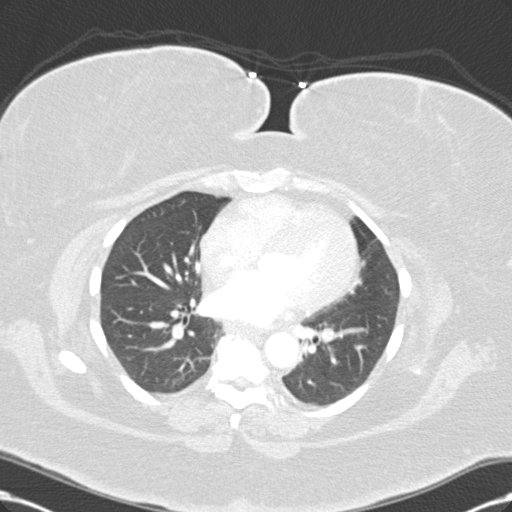
[im 73/146  soft-tissue]
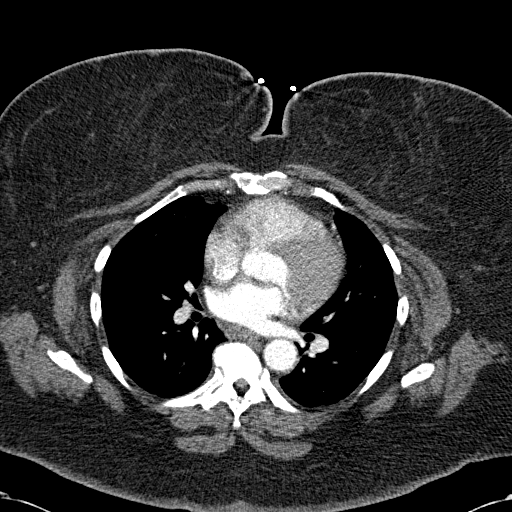
[im 80/146  lung]
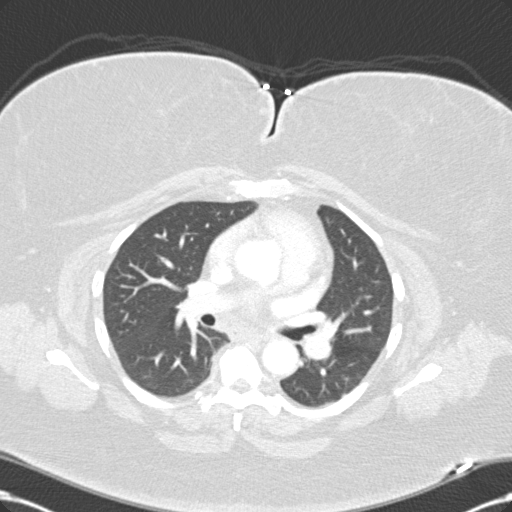
[im 93/146  soft-tissue]
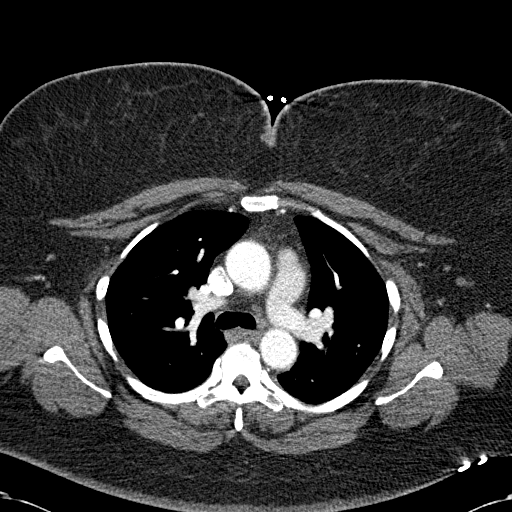
[im 99/146  lung]
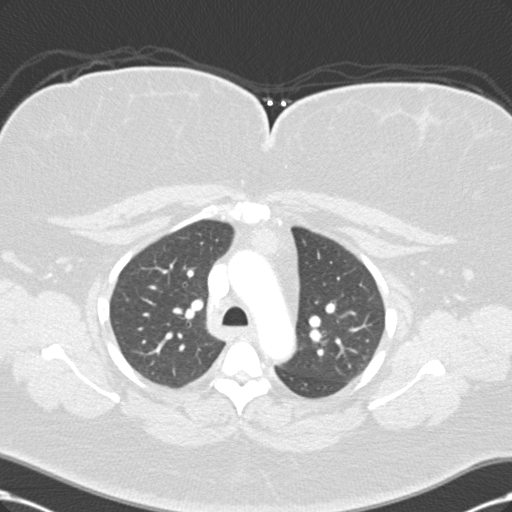
[im 113/146  soft-tissue]
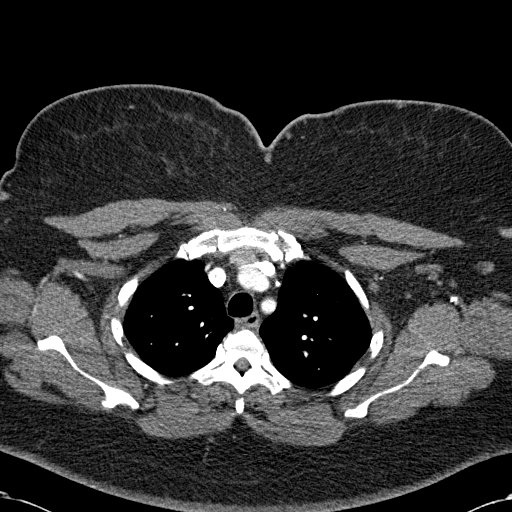
[im 119/146  lung]
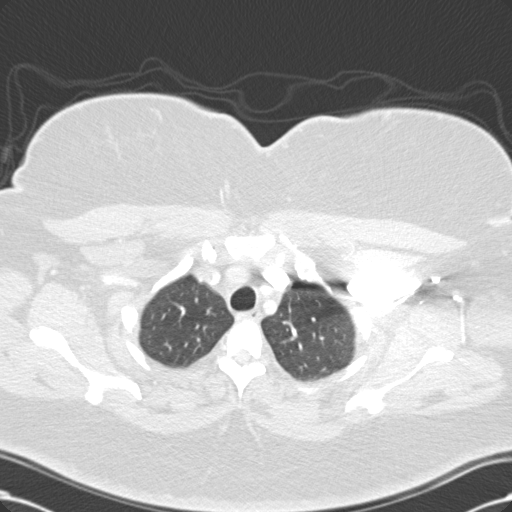
[im 126/146  soft-tissue]
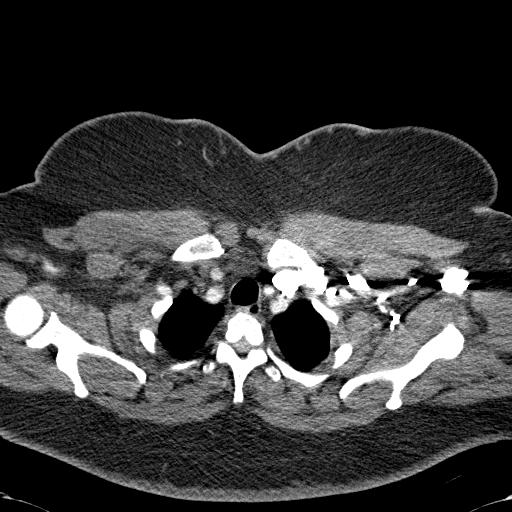
[im 139/146  lung]
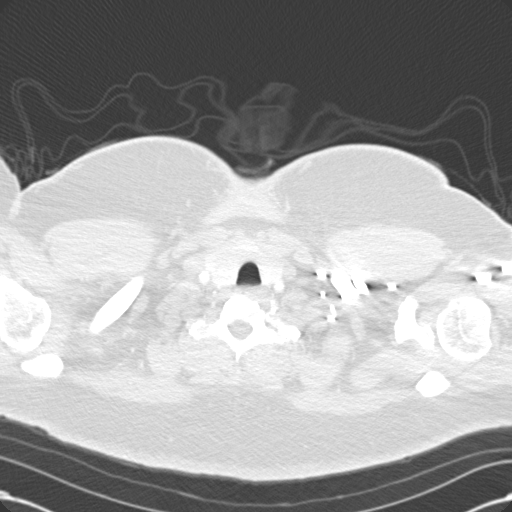

[Series 9: coronals · coronal · 0.99mm/px · 3 of 74 slices shown]
[im 19/74  soft-tissue]
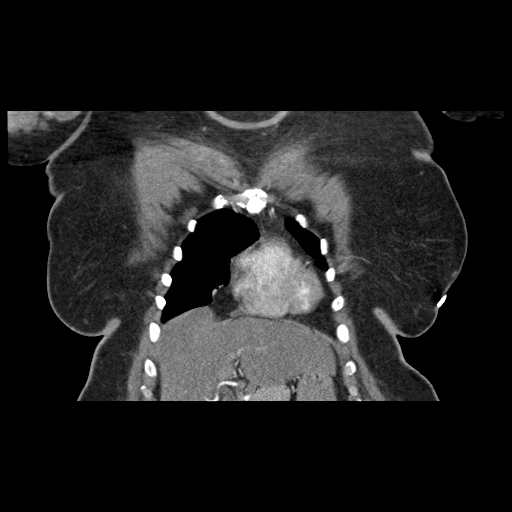
[im 37/74  soft-tissue]
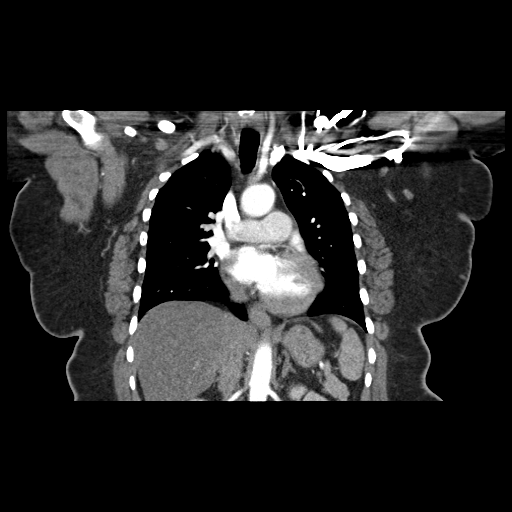
[im 55/74  soft-tissue]
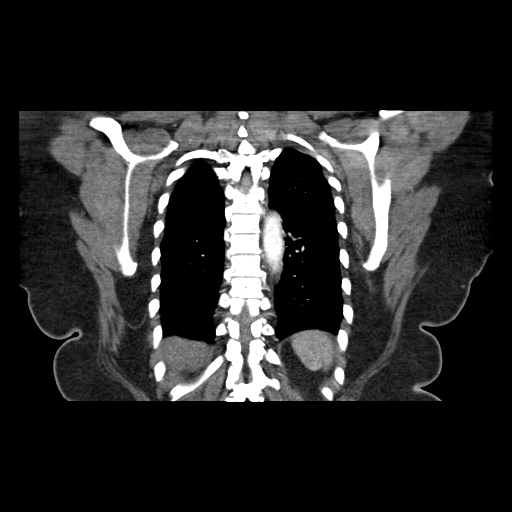

[18 of 46 positions shown; findings below may reference images not displayed]

FINDINGS: The thoracic aorta is of normal caliber.  There is no evidence of
thoracic aortic dissection. No periaortic stranding.  Evaluation of
the noncontrast images are negative for intramural hematoma.
Bovine configuration of the aortic arch.  The visualized portions
of the cervical vasculature are patent.  Although this examination
was not tailored for evaluation of the pulmonary arteries, there is
no discrete filling defect within the main, right or left pulmonary
arteries.  Normal heart size.  No pericardial effusion.

Nonvascular findings:

Redemonstrated anterior mediastinal nodules. Dominant, more
inferiorly located nodule measures approximately 2.2 x 2.2 cm,
grossly unchanged compared to recent examinations, however
minimally increased in size compared to remote examination from
08/20/2003 at which time it measured approximately 1.7 x 2.1 cm.
Smaller more cranially located anterior mediastinal nodule is also
unchanged compared to recent examinations measuring approximate
x 2.1 cm also appears minimally increased compared to remote
examination from 8335, at which time it measured approximately
x 1.5 cm. No new mediastinal nodules.  There is no additional
mediastinal, hilar or axillary lymphadenopathy.

There is minimal dependent ground-glass opacities favored to
represent atelectasis.  Linear heterogeneous opacities within the
right middle lobe and lingula favored to represent subsegmental
atelectasis.  No focal airspace opacities.  No pleural effusion or
pneumothorax.  The central airways are patent.   There is unchanged
mild asymmetric enlargement of the right lobe of the thyroid a
comparison to the left without discrete nodule formation.

Evaluation of the abdominal organs through the visualization of the
upper abdomen demonstrates a pleural fragment within the gastric
fundus.

No acute or aggressive osseous lesions.  Minimal thoracic spine
degenerative change.
IMPRESSION: 1. No acute findings within the chest.  Specifically, no evidence
of thoracic aortic dissection.
2. Anterior mediastinal nodules are unchanged compared to recent
prior examinations, and given very minimal increase in size since
remote examination performed 08/20/2003 are favored to be benign in
etiology.

3. For the purposes of lifetime radiation exposure in this 50 year
old female, it should be noted that this patient has had 26 chest
CTs since the most remote examination performed 10/07/2000.

## 2013-04-18 ENCOUNTER — Encounter (HOSPITAL_COMMUNITY): Payer: Self-pay | Admitting: Emergency Medicine

## 2013-04-18 ENCOUNTER — Emergency Department (HOSPITAL_COMMUNITY): Payer: Medicare Other

## 2013-04-18 DIAGNOSIS — F329 Major depressive disorder, single episode, unspecified: Secondary | ICD-10-CM | POA: Insufficient documentation

## 2013-04-18 DIAGNOSIS — E119 Type 2 diabetes mellitus without complications: Secondary | ICD-10-CM | POA: Insufficient documentation

## 2013-04-18 DIAGNOSIS — Z7982 Long term (current) use of aspirin: Secondary | ICD-10-CM | POA: Insufficient documentation

## 2013-04-18 DIAGNOSIS — Z79899 Other long term (current) drug therapy: Secondary | ICD-10-CM | POA: Insufficient documentation

## 2013-04-18 DIAGNOSIS — Z86711 Personal history of pulmonary embolism: Secondary | ICD-10-CM | POA: Insufficient documentation

## 2013-04-18 DIAGNOSIS — I10 Essential (primary) hypertension: Secondary | ICD-10-CM | POA: Insufficient documentation

## 2013-04-18 DIAGNOSIS — R002 Palpitations: Secondary | ICD-10-CM | POA: Insufficient documentation

## 2013-04-18 DIAGNOSIS — Z87891 Personal history of nicotine dependence: Secondary | ICD-10-CM | POA: Insufficient documentation

## 2013-04-18 DIAGNOSIS — F3289 Other specified depressive episodes: Secondary | ICD-10-CM | POA: Insufficient documentation

## 2013-04-18 DIAGNOSIS — R0789 Other chest pain: Secondary | ICD-10-CM | POA: Insufficient documentation

## 2013-04-18 DIAGNOSIS — E669 Obesity, unspecified: Secondary | ICD-10-CM | POA: Insufficient documentation

## 2013-04-18 DIAGNOSIS — Z85830 Personal history of malignant neoplasm of bone: Secondary | ICD-10-CM | POA: Insufficient documentation

## 2013-04-18 DIAGNOSIS — J45901 Unspecified asthma with (acute) exacerbation: Secondary | ICD-10-CM | POA: Insufficient documentation

## 2013-04-18 DIAGNOSIS — F411 Generalized anxiety disorder: Secondary | ICD-10-CM | POA: Insufficient documentation

## 2013-04-18 DIAGNOSIS — Z880 Allergy status to penicillin: Secondary | ICD-10-CM | POA: Insufficient documentation

## 2013-04-18 DIAGNOSIS — I209 Angina pectoris, unspecified: Secondary | ICD-10-CM | POA: Insufficient documentation

## 2013-04-18 DIAGNOSIS — I509 Heart failure, unspecified: Secondary | ICD-10-CM | POA: Insufficient documentation

## 2013-04-18 DIAGNOSIS — IMO0002 Reserved for concepts with insufficient information to code with codable children: Secondary | ICD-10-CM | POA: Insufficient documentation

## 2013-04-18 LAB — BASIC METABOLIC PANEL
BUN: 10 mg/dL (ref 6–23)
CO2: 28 mEq/L (ref 19–32)
Calcium: 9.4 mg/dL (ref 8.4–10.5)
Chloride: 103 mEq/L (ref 96–112)
Creatinine, Ser: 0.73 mg/dL (ref 0.50–1.10)
GFR calc Af Amer: >90 mL/min (ref >90)
GFR calc non Af Amer: >90 mL/min (ref >90)
Glucose, Bld: 95 mg/dL (ref 70–99)
Potassium: 3.5 mEq/L (ref 3.5–5.1)
Sodium: 143 mEq/L (ref 135–145)

## 2013-04-18 LAB — POCT I-STAT TROPONIN I: Troponin i, poc: 0.01 ng/mL (ref 0.00–0.08)

## 2013-04-18 LAB — CBC
HCT: 40.1 % (ref 36.0–46.0)
Hemoglobin: 12.8 g/dL (ref 12.0–15.0)
MCH: 27.7 pg (ref 26.0–34.0)
MCHC: 31.9 g/dL (ref 30.0–36.0)
MCV: 86.8 fL (ref 78.0–100.0)
Platelets: 256 10*3/uL (ref 150–400)
RBC: 4.62 MIL/uL (ref 3.87–5.11)
RDW: 13.8 % (ref 11.5–15.5)
WBC: 7.2 10*3/uL (ref 4.0–10.5)

## 2013-04-18 LAB — PRO B NATRIURETIC PEPTIDE: Pro B Natriuretic peptide (BNP): 34.9 pg/mL (ref 0–125)

## 2013-04-18 NOTE — ED Notes (Signed)
Per pt chest pain and SOB that started a week ago/ denies cough, fever. sts some chills. sts SOB upon exertion

## 2013-04-19 ENCOUNTER — Emergency Department (HOSPITAL_COMMUNITY)
Admission: EM | Admit: 2013-04-19 | Discharge: 2013-04-19 | Disposition: A | Discharge status: Home / Self Care | Payer: Medicare Other | Attending: Emergency Medicine | Admitting: Emergency Medicine

## 2013-04-19 DIAGNOSIS — R079 Chest pain, unspecified: Secondary | ICD-10-CM

## 2013-04-19 MED ORDER — ASPIRIN 81 MG PO CHEW
324.0000 mg | CHEWABLE_TABLET | Freq: Once | ORAL | Status: AC
  Administered 2013-04-19: 324 mg via ORAL
  Filled 2013-04-19: qty 4

## 2013-04-19 MED ORDER — NITROGLYCERIN 0.4 MG SL SUBL: 0.4000 mg | SUBLINGUAL_TABLET | SUBLINGUAL | Status: DC | PRN

## 2013-04-19 MED ORDER — TRAMADOL HCL 50 MG PO TABS
50.0000 mg | ORAL_TABLET | Freq: Once | ORAL | Status: AC
  Administered 2013-04-19: 50 mg via ORAL
  Filled 2013-04-19: qty 1

## 2013-04-19 NOTE — ED Provider Notes (Signed)
CSN: [id number]     Arrival date & time 04/18/13  1754 History   First MD Initiated Contact with Patient 04/19/13 0153     Chief Complaint  Patient presents with  . Chest Pain  . Shortness of Breath   HPI  History provided by the patient. Patient is a 52 year old female with history of hypertension, diabetes, hyperlipidemia, CHF and previous PE presents with complaints of intermittent chest pain, heart palpitations and shortness of breath. Patient states for one week or more she has noticed very brief episodes of heart flutter described as rapid heartbeat lasting a few seconds to minutes. This may happen at any time throughout the day. She reports having this on most days. It is not related to any specific activity. She also reports having some increasing episodes of left chest pain and discomfort. Chest pain is not related to her palpitations and may come separately. It seems worse with laying flat and with activity. Pain does radiate to the left upper extremity. Pain may last several minutes. She also reports occasional episodes of shortness of breath they're worse with exertion. Denies having any recent cough. No hemoptysis. No fever or congestion. She denies any increased swelling in extremities. She has been taking her normal medications as prescribed. No other aggravating or alleviating factors. No other associated symptoms.     Past Medical History  Diagnosis Date  . Hypertension   . Diabetes mellitus   . Asthma   . Anginal pain     a. NL cath in 2008;  b. Myoview 03/2011: dec uptake along mid anterior wall on stress imaging -> ? attenuation vs. ischemia, EF 65%;  c. Echo 04/2011: EF 55-60%, no RWMA, Gr 2 dd  . Depression   . Anxiety   . Pulmonary embolism     a. 2008 -> coumadin x 6 mos.  . Sleep apnea   . 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 Obesity   . CHF (congestive heart failure)   . CHF (congestive heart failure)   . Pulmonary edema   . Cancer   .  Bone cancer    Past Surgical History  Procedure Laterality Date  . Abdominal hysterectomy  2005  . Leg surgery    . Tubal ligation  1989  . Left knee surgery  2008  . Laparoscopic appendectomy N/A 06/03/2012    Procedure: APPENDECTOMY LAPAROSCOPIC;  Surgeon: Almond Lint, MD;  Location: [facility] OR;  Service: General;  Laterality: N/A;  . Cardiac catheterization      Normal   Family History  Problem Relation Age of Onset  . Emphysema Mother   . Arthritis Mother   . Heart failure Mother     alive @ 12  . Asthma Brother   . Heart disease Paternal Grandfather   . Heart disease Father     died @ 51's.  . Stroke Father   . Colon cancer Maternal Grandfather    History  Substance Use Topics  . Smoking status: Former Smoker -- 0.50 packs/day for 15 years    Types: Cigarettes  . Smokeless tobacco: Not on file  . Alcohol Use: No   OB History   Grav Para Term Preterm Abortions TAB SAB Ect Mult Living                 Review of Systems  Constitutional: Negative for fever, chills, diaphoresis and fatigue.  Respiratory: Positive for shortness of breath. Negative for cough and  wheezing.   Cardiovascular: Positive for chest pain and palpitations. Negative for leg swelling.  Gastrointestinal: Negative for nausea, vomiting, diarrhea and constipation.  All other systems reviewed and are negative.    Allergies  Hydrocodone; Penicillins; and Sulfa antibiotics  Home Medications   Current Outpatient Rx  Name  Route  Sig  Dispense  Refill  . albuterol (PROVENTIL,VENTOLIN) 90 MCG/ACT inhaler   Inhalation   Inhale 2 puffs into the lungs every 4 (four) hours as needed for shortness of breath.          [name] aspirin 81 MG tablet   Oral   Take 81 mg by mouth daily.           [name] atorvastatin (LIPITOR) 40 MG tablet   Oral   Take 40 mg by mouth at bedtime.          . beclomethasone (QVAR) 80 MCG/ACT inhaler   Inhalation   Inhale 1 puff into the lungs 2 (two) times daily.         .  carvedilol (COREG CR) 10 MG 24 hr capsule   Oral   Take 1 capsule (10 mg total) by mouth daily.   60 capsule   3   . furosemide (LASIX) 40 MG tablet   Oral   Take 40 mg by mouth daily.         [name] HYDROcodone-acetaminophen (NORCO/VICODIN) 5-325 MG per tablet   Oral   Take 1 tablet by mouth every 6 (six) hours as needed for moderate pain.         . isosorbide dinitrate (ISORDIL) 20 MG tablet   Oral   Take 20 mg by mouth 2 (two) times daily.          [name] lisinopril (PRINIVIL,ZESTRIL) 20 MG tablet   Oral   Take 20 mg by mouth daily.          [name] loratadine (CLARITIN) 10 MG tablet   Oral   Take 10 mg by mouth daily.         . metFORMIN (GLUCOPHAGE) 1000 MG tablet   Oral   Take 1,000 mg by mouth 2 (two) times daily with a meal.         . omeprazole (PRILOSEC) 20 MG capsule   Oral   Take 20 mg by mouth daily.           . potassium chloride (K-DUR,KLOR-CON) 10 MEQ tablet   Oral   Take 2 tablets (20 mEq total) by mouth 2 (two) times daily.   30 tablet   0   . sertraline (ZOLOFT) 100 MG tablet   Oral   Take 100 mg by mouth daily.          . nitroGLYCERIN (NITROSTAT) 0.4 MG SL tablet   Sublingual   Place 0.4 mg under the tongue every 5 (five) minutes as needed. For chest pain.          BP 145/78  Pulse 74  Temp(Src) 98 F (36.7 C) (Oral)  Resp 20  SpO2 96% Physical Exam  Nursing note and vitals reviewed. Constitutional: She is oriented to person, place, and time. She appears well-developed and well-nourished. No distress.  HENT:  Head: Normocephalic and atraumatic.  Cardiovascular: Normal rate and regular rhythm.   Pulmonary/Chest: Effort normal and breath sounds normal. No respiratory distress. She has no wheezes. She has no rales. She exhibits tenderness.  Abdominal: Soft. There is no tenderness. There is no rebound and no guarding.  Musculoskeletal: Normal  range of motion. She exhibits no edema.  Neurological: She is alert and oriented to person,  place, and time.  Skin: Skin is warm and dry. No rash noted.  Psychiatric: She has a normal mood and affect. Her behavior is normal.    ED Course  Procedures   DIAGNOSTIC STUDIES: Oxygen Saturation is 98% on room air.    COORDINATION OF CARE:  Nursing notes reviewed. Vital signs reviewed. Initial pt interview and examination performed.   2:03 AM-patient seen and evaluated. Patient appears well no acute distress. Continues to have some chest discomfort with some tenderness to palpation. Discussed work up plan with pt at bedside, which includes lab testing, EKG, chest x-ray. Pt agrees with plan.  2:20AM Pt discussed with Attending Physician.  Pt has a HEART score of 3 and low risk.  She has atypical symptoms for ACS and PE. At this time pt felt able to return home and follow up with PCP on Monday.  Strict return precautions given.   Results for orders placed during the hospital encounter of 04/19/13  CBC      Result Value Range   WBC 7.2  4.0 - 10.5 K/uL   RBC 4.62  3.87 - 5.11 MIL/uL   Hemoglobin 12.8  12.0 - 15.0 g/dL   HCT 36.6  44.0 - 34.7 %   MCV 86.8  78.0 - 100.0 fL   MCH 27.7  26.0 - 34.0 pg   MCHC 31.9  30.0 - 36.0 g/dL   RDW 42.5  95.6 - 38.7 %   Platelets 256  150 - 400 K/uL  BASIC METABOLIC PANEL      Result Value Range   Sodium 143  135 - 145 mEq/L   Potassium 3.5  3.5 - 5.1 mEq/L   Chloride 103  96 - 112 mEq/L   CO2 28  19 - 32 mEq/L   Glucose, Bld 95  70 - 99 mg/dL   BUN 10  6 - 23 mg/dL   Creatinine, Ser 5.64  0.50 - 1.10 mg/dL   Calcium 9.4  8.4 - 33.2 mg/dL   GFR calc non Af Amer >90  >90 mL/min   GFR calc Af Amer >90  >90 mL/min  PRO B NATRIURETIC PEPTIDE      Result Value Range   Pro B Natriuretic peptide (BNP) 34.9  0 - 125 pg/mL  POCT I-STAT TROPONIN I      Result Value Range   Troponin i, poc 0.01  0.00 - 0.08 ng/mL   Comment 3              Imaging Review Dg Chest 2 View  04/18/2013   CLINICAL DATA:  Intermittent chest pain and difficulty  breathing.  EXAM: CHEST  2 VIEW  COMPARISON:  10/28/2012  FINDINGS: Cardiac silhouette is mildly enlarged. Mediastinum is normal in contour. No hilar masses.  The lungs are clear.  No pleural effusion or pneumothorax.  The bony thorax is intact.  IMPRESSION: No acute cardiopulmonary disease.   Electronically Signed   By: Amie Portland M.D.   On: 04/18/2013 20:32    EKG Interpretation    Date/Time:  Friday April 18 2013 18:23:50 EST Ventricular Rate:  72 PR Interval:  182 QRS Duration: 98 QT Interval:  408 QTC Calculation: 446 R Axis:   83 Text Interpretation:  Normal sinus rhythm Lateral infarct , age undetermined Abnormal ECG unchanged Confirmed by MILLER  MD, BRIAN ([id number]) on 04/19/2013 2:26:44 AM  MDM   1. Chest pain        Angus Seller, PA-C 04/19/13 [phone]

## 2013-04-19 NOTE — ED Provider Notes (Signed)
Medical screening examination/treatment/procedure(s) were performed by non-physician practitioner and as supervising physician I was immediately available for consultation/collaboration.    Vida Roller, MD 04/19/13 [phone]

## 2013-04-19 NOTE — ED Notes (Signed)
Pt ambulating independently w/ steady gait on d/c in no acute distress, A&Ox4. D/c instructions reviewed w/ pt - pt denies any further questions or concerns at present.  

## 2013-05-03 ENCOUNTER — Emergency Department (HOSPITAL_COMMUNITY)
Admission: EM | Admit: 2013-05-03 | Discharge: 2013-05-03 | Disposition: A | Discharge status: Home / Self Care | Payer: PRIVATE HEALTH INSURANCE | Attending: Emergency Medicine | Admitting: Emergency Medicine

## 2013-05-03 ENCOUNTER — Encounter (HOSPITAL_COMMUNITY): Payer: Self-pay | Admitting: Emergency Medicine

## 2013-05-03 ENCOUNTER — Emergency Department (HOSPITAL_COMMUNITY): Payer: PRIVATE HEALTH INSURANCE

## 2013-05-03 DIAGNOSIS — F411 Generalized anxiety disorder: Secondary | ICD-10-CM | POA: Insufficient documentation

## 2013-05-03 DIAGNOSIS — J45901 Unspecified asthma with (acute) exacerbation: Secondary | ICD-10-CM | POA: Insufficient documentation

## 2013-05-03 DIAGNOSIS — E119 Type 2 diabetes mellitus without complications: Secondary | ICD-10-CM | POA: Insufficient documentation

## 2013-05-03 DIAGNOSIS — Z86711 Personal history of pulmonary embolism: Secondary | ICD-10-CM | POA: Insufficient documentation

## 2013-05-03 DIAGNOSIS — IMO0002 Reserved for concepts with insufficient information to code with codable children: Secondary | ICD-10-CM | POA: Insufficient documentation

## 2013-05-03 DIAGNOSIS — F329 Major depressive disorder, single episode, unspecified: Secondary | ICD-10-CM | POA: Insufficient documentation

## 2013-05-03 DIAGNOSIS — Z85830 Personal history of malignant neoplasm of bone: Secondary | ICD-10-CM | POA: Insufficient documentation

## 2013-05-03 DIAGNOSIS — R5383 Other fatigue: Secondary | ICD-10-CM

## 2013-05-03 DIAGNOSIS — R5381 Other malaise: Secondary | ICD-10-CM | POA: Insufficient documentation

## 2013-05-03 DIAGNOSIS — R609 Edema, unspecified: Secondary | ICD-10-CM | POA: Insufficient documentation

## 2013-05-03 DIAGNOSIS — F3289 Other specified depressive episodes: Secondary | ICD-10-CM | POA: Insufficient documentation

## 2013-05-03 DIAGNOSIS — Z79899 Other long term (current) drug therapy: Secondary | ICD-10-CM | POA: Insufficient documentation

## 2013-05-03 DIAGNOSIS — Z87891 Personal history of nicotine dependence: Secondary | ICD-10-CM | POA: Insufficient documentation

## 2013-05-03 DIAGNOSIS — I509 Heart failure, unspecified: Secondary | ICD-10-CM | POA: Insufficient documentation

## 2013-05-03 DIAGNOSIS — R0602 Shortness of breath: Secondary | ICD-10-CM

## 2013-05-03 DIAGNOSIS — R002 Palpitations: Secondary | ICD-10-CM | POA: Insufficient documentation

## 2013-05-03 DIAGNOSIS — Z7982 Long term (current) use of aspirin: Secondary | ICD-10-CM | POA: Insufficient documentation

## 2013-05-03 DIAGNOSIS — Z95818 Presence of other cardiac implants and grafts: Secondary | ICD-10-CM | POA: Insufficient documentation

## 2013-05-03 DIAGNOSIS — Z880 Allergy status to penicillin: Secondary | ICD-10-CM | POA: Insufficient documentation

## 2013-05-03 DIAGNOSIS — I10 Essential (primary) hypertension: Secondary | ICD-10-CM | POA: Insufficient documentation

## 2013-05-03 DIAGNOSIS — E669 Obesity, unspecified: Secondary | ICD-10-CM | POA: Insufficient documentation

## 2013-05-03 LAB — CBC WITH DIFFERENTIAL/PLATELET
Basophils Absolute: 0 10*3/uL (ref 0.0–0.1)
Basophils Relative: 0 % (ref 0–1)
Eosinophils Absolute: 1 10*3/uL — ABNORMAL HIGH (ref 0.0–0.7)
Eosinophils Relative: 14 % — ABNORMAL HIGH (ref 0–5)
HCT: 39.5 % (ref 36.0–46.0)
Hemoglobin: 13.1 g/dL (ref 12.0–15.0)
Lymphocytes Relative: 40 % (ref 12–46)
Lymphs Abs: 2.7 10*3/uL (ref 0.7–4.0)
MCH: 28.8 pg (ref 26.0–34.0)
MCHC: 33.2 g/dL (ref 30.0–36.0)
MCV: 86.8 fL (ref 78.0–100.0)
Monocytes Absolute: 0.4 10*3/uL (ref 0.1–1.0)
Monocytes Relative: 6 % (ref 3–12)
Neutro Abs: 2.7 10*3/uL (ref 1.7–7.7)
Neutrophils Relative %: 39 % — ABNORMAL LOW (ref 43–77)
Platelets: 238 10*3/uL (ref 150–400)
RBC: 4.55 MIL/uL (ref 3.87–5.11)
RDW: 13.5 % (ref 11.5–15.5)
WBC: 6.8 10*3/uL (ref 4.0–10.5)

## 2013-05-03 LAB — COMPREHENSIVE METABOLIC PANEL
ALT: 27 U/L (ref 0–35)
AST: 16 U/L (ref 0–37)
Albumin: 3.7 g/dL (ref 3.5–5.2)
Alkaline Phosphatase: 65 U/L (ref 39–117)
BUN: 11 mg/dL (ref 6–23)
CO2: 30 mEq/L (ref 19–32)
Calcium: 9.5 mg/dL (ref 8.4–10.5)
Chloride: 103 mEq/L (ref 96–112)
Creatinine, Ser: 0.78 mg/dL (ref 0.50–1.10)
GFR calc Af Amer: >90 mL/min (ref >90)
GFR calc non Af Amer: >90 mL/min (ref >90)
Glucose, Bld: 88 mg/dL (ref 70–99)
Potassium: 3.6 mEq/L — ABNORMAL LOW (ref 3.7–5.3)
Sodium: 144 mEq/L (ref 137–147)
Total Bilirubin: 0.3 mg/dL (ref 0.3–1.2)
Total Protein: 7.2 g/dL (ref 6.0–8.3)

## 2013-05-03 LAB — URINALYSIS, ROUTINE W REFLEX MICROSCOPIC
Bilirubin Urine: NEGATIVE
Glucose, UA: NEGATIVE mg/dL
Hgb urine dipstick: NEGATIVE
Ketones, ur: NEGATIVE mg/dL
Leukocytes, UA: NEGATIVE
Nitrite: NEGATIVE
Protein, ur: NEGATIVE mg/dL
Specific Gravity, Urine: 1.029 (ref 1.005–1.030)
Urobilinogen, UA: 0.2 mg/dL (ref 0.0–1.0)
pH: 6 (ref 5.0–8.0)

## 2013-05-03 LAB — TROPONIN I
Troponin I: <0.3 ng/mL (ref <0.30)
Troponin I: <0.3 ng/mL (ref <0.30)

## 2013-05-03 LAB — PRO B NATRIURETIC PEPTIDE: Pro B Natriuretic peptide (BNP): 26.3 pg/mL (ref 0–125)

## 2013-05-03 MED ORDER — ACETAMINOPHEN 500 MG PO TABS
1000.0000 mg | ORAL_TABLET | Freq: Once | ORAL | Status: AC
  Administered 2013-05-03: 1000 mg via ORAL
  Filled 2013-05-03: qty 2

## 2013-05-03 NOTE — ED Notes (Signed)
Ambulated patient in hallway. Pt. Oxygen saturation stayed higher than 93% while ambulating.

## 2013-05-03 NOTE — ED Provider Notes (Signed)
CSN: [id number]     Arrival date & time 05/03/13  1414 History   First MD Initiated Contact with Patient 05/03/13 1503     Chief Complaint  Patient presents with  . Chest Pain   (Consider location/radiation/quality/duration/timing/severity/associated sxs/prior Treatment) Patient is a 53 y.o. female presenting with chest pain.  Chest Pain Pain location:  L chest Pain quality: sharp and stabbing   Pain radiates to:  Mid back Pain severity:  Moderate Onset quality:  Gradual Duration:  3 days Timing:  Constant Progression:  Worsening Chronicity:  Recurrent Context: breathing, lifting and movement   Context comment:  Reported history of PE. Relieved by:  Nothing Worsened by:  Movement, deep breathing and certain positions Ineffective treatments: home meds. Associated symptoms: fatigue, lower extremity edema (better now), palpitations (feeling of heart racing) and shortness of breath   Associated symptoms: no abdominal pain, no cough, no fever, no nausea and not vomiting   Associated symptoms comment:  Chills, sweats   Past Medical History  Diagnosis Date  . Hypertension   . Diabetes mellitus   . Asthma   . Anginal pain     a. NL cath in 2008;  b. Myoview 03/2011: dec uptake along mid anterior wall on stress imaging -> ? attenuation vs. ischemia, EF 65%;  c. Echo 04/2011: EF 55-60%, no RWMA, Gr 2 dd  . Depression   . Anxiety   . Pulmonary embolism     a. 2008 -> coumadin x 6 mos.  . Sleep apnea   . 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 Obesity   . CHF (congestive heart failure)   . CHF (congestive heart failure)   . Pulmonary edema   . Cancer   . Bone cancer    Past Surgical History  Procedure Laterality Date  . Abdominal hysterectomy  2005  . Leg surgery    . Tubal ligation  1989  . Left knee surgery  2008  . Laparoscopic appendectomy N/A 06/03/2012    Procedure: APPENDECTOMY LAPAROSCOPIC;  Surgeon: Stark Klein, MD;  Location: [facility] OR;  Service:  General;  Laterality: N/A;  . Cardiac catheterization      Normal   Family History  Problem Relation Age of Onset  . Emphysema Mother   . Arthritis Mother   . Heart failure Mother     alive @ 71  . Asthma Brother   . Heart disease Paternal Grandfather   . Heart disease Father     died @ 21's.  . Stroke Father   . Colon cancer Maternal Grandfather    History  Substance Use Topics  . Smoking status: Former Smoker -- 0.50 packs/day for 15 years    Types: Cigarettes  . Smokeless tobacco: Not on file  . Alcohol Use: No   OB History   Grav Para Term Preterm Abortions TAB SAB Ect Mult Living                 Review of Systems  Constitutional: Positive for fatigue. Negative for fever.  HENT: Negative for congestion.   Respiratory: Positive for shortness of breath. Negative for cough.   Cardiovascular: Positive for chest pain and palpitations (feeling of heart racing).  Gastrointestinal: Negative for nausea, vomiting, abdominal pain and diarrhea.  All other systems reviewed and are negative.    Allergies  Hydrocodone; Penicillins; and Sulfa antibiotics  Home Medications   Current Outpatient Rx  Name  Route  Sig  Dispense  Refill  . albuterol (PROVENTIL,VENTOLIN) 90 MCG/ACT inhaler   Inhalation   Inhale 2 puffs into the lungs every 4 (four) hours as needed for shortness of breath.          [name] aspirin 81 MG tablet   Oral   Take 81 mg by mouth daily.           [name] atorvastatin (LIPITOR) 40 MG tablet   Oral   Take 40 mg by mouth at bedtime.          . beclomethasone (QVAR) 80 MCG/ACT inhaler   Inhalation   Inhale 1 puff into the lungs 2 (two) times daily.         . carvedilol (COREG CR) 10 MG 24 hr capsule   Oral   Take 1 capsule (10 mg total) by mouth daily.   60 capsule   3   . furosemide (LASIX) 40 MG tablet   Oral   Take 40 mg by mouth daily.         [name] HYDROcodone-acetaminophen (NORCO/VICODIN) 5-325 MG per tablet   Oral   Take 1 tablet by mouth  every 6 (six) hours as needed for moderate pain.         . isosorbide dinitrate (ISORDIL) 20 MG tablet   Oral   Take 20 mg by mouth 2 (two) times daily.          [name] lisinopril (PRINIVIL,ZESTRIL) 20 MG tablet   Oral   Take 20 mg by mouth daily.          [name] loratadine (CLARITIN) 10 MG tablet   Oral   Take 10 mg by mouth daily.         . metFORMIN (GLUCOPHAGE) 1000 MG tablet   Oral   Take 1,000 mg by mouth 2 (two) times daily with a meal.         . nitroGLYCERIN (NITROSTAT) 0.4 MG SL tablet   Sublingual   Place 0.4 mg under the tongue every 5 (five) minutes as needed. For chest pain.         [name] omeprazole (PRILOSEC) 20 MG capsule   Oral   Take 20 mg by mouth daily.           . potassium chloride (K-DUR,KLOR-CON) 10 MEQ tablet   Oral   Take 2 tablets (20 mEq total) by mouth 2 (two) times daily.   30 tablet   0   . sertraline (ZOLOFT) 100 MG tablet   Oral   Take 100 mg by mouth daily.           BP 138/69  Pulse 74  Temp(Src) 98.4 F (36.9 C) (Oral)  Ht 4\' 11"  (1.499 m)  Wt 221 lb 2 oz (100.302 kg)  BMI 44.64 kg/m2  SpO2 97% Physical Exam  Nursing note and vitals reviewed. Constitutional: She is oriented to person, place, and time. She appears well-developed and well-nourished. No distress.  HENT:  Head: Normocephalic and atraumatic.  Mouth/Throat: Oropharynx is clear and moist.  Eyes: Conjunctivae are normal. Pupils are equal, round, and reactive to light. No scleral icterus.  Neck: Neck supple.  Cardiovascular: Normal rate, regular rhythm, normal heart sounds and intact distal pulses.   No murmur heard. Pulmonary/Chest: Effort normal and breath sounds normal. No accessory muscle usage or stridor. Not tachypneic. No respiratory distress. She has no decreased breath sounds. She has no wheezes. She has no rhonchi. She has no rales.  Abdominal: Soft. Bowel sounds are normal. She exhibits  no distension. There is no tenderness. There is no rebound and no  guarding.  Musculoskeletal: Normal range of motion. She exhibits no edema.  Neurological: She is alert and oriented to person, place, and time.  Skin: Skin is warm and dry. No rash noted.  Psychiatric: She has a normal mood and affect. Her behavior is normal.    ED Course  Procedures (including critical care time) Labs Review Labs Reviewed  CBC WITH DIFFERENTIAL - Abnormal; Notable for the following:    Neutrophils Relative % 39 (*)    Eosinophils Relative 14 (*)    Eosinophils Absolute 1.0 (*)    All other components within normal limits  COMPREHENSIVE METABOLIC PANEL - Abnormal; Notable for the following:    Potassium 3.6 (*)    All other components within normal limits  URINALYSIS, ROUTINE W REFLEX MICROSCOPIC  TROPONIN I  PRO B NATRIURETIC PEPTIDE  TROPONIN I   Imaging Review Dg Chest 2 View  05/03/2013   CLINICAL DATA:  Chest pain  EXAM: CHEST  2 VIEW  COMPARISON:  04/18/2013  FINDINGS: Cardiomediastinal silhouette is stable. No acute infiltrate or pleural effusion. No pulmonary edema. Degenerative changes thoracic spine again noted.  IMPRESSION: No active cardiopulmonary disease.   Electronically Signed   By: Lahoma Crocker M.D.   On: 05/03/2013 16:25  All radiology studies independently viewed by me.     EKG Interpretation    Date/Time:  Saturday May 03 2013 14:24:38 EST Ventricular Rate:  69 PR Interval:  182 QRS Duration: 98 QT Interval:  390 QTC Calculation: 417 R Axis:   97 Text Interpretation:  Normal sinus rhythm Rightward axis Borderline ECG No significant change was found Confirmed by [facility]  MD, TREY ([id number]) on 05/03/2013 3:11:08 PM            MDM   1. Shortness of breath   2. Fatigue    53 yo female with CC of fatigue, headache, sweats and chills, and progressive chest pain and shortness of breath over past 3 days.  She reports that her chest pain and shortness of breath are consistent with multiple other similar ED visits.  Her chest is tender to  palpation and she has pain with movement.  I suspect her pain is primarily msk in nature.  Her run down feeling is possibly secondary to flu or other viral syndrome.  She reports aches, sweats, chills, etc.  Will check CXR and bloodwork.    I have low suspicion for ACS or acute CHF exacerbation based on history and physical.  I also have a low suspicion for PE.  She was apparently diagnosed with a PE in 2008 and was treated with coumadin for 6 months.  On review of her chart, her CT scan from that time questioned a PE, but did not show definitive evidence.  Since January 2008, she has had an additional 18 CT PE studies which have all been negative for PE.  She has had 12 other chest CTs.  I do not think that obtaining an additional CT scan today would be beneficial or in this patient's best interest.    Her labworkup is reassuring.  She was able to ambulate without hypoxia or increased WOB.  I suspect her symptoms are most likely secondary to a viral syndrome.  She is stable for continued outpatient and support care.    Houston Siren, MD 05/03/13 [phone]

## 2013-05-03 NOTE — Discharge Instructions (Signed)
Fatigue °Fatigue is a feeling of tiredness, lack of energy, lack of motivation, or feeling tired all the time. Having enough rest, good nutrition, and reducing stress will normally reduce fatigue. Consult your caregiver if it persists. The nature of your fatigue will help your caregiver to find out its cause. The treatment is based on the cause.  °CAUSES  °There are many causes for fatigue. Most of the time, fatigue can be traced to one or more of your habits or routines. Most causes fit into one or more of three general areas. They are: °Lifestyle problems °· Sleep disturbances. °· Overwork. °· Physical exertion. °· Unhealthy habits. °· Poor eating habits or eating disorders. °· Alcohol and/or drug use . °· Lack of proper nutrition (malnutrition). °Psychological problems °· Stress and/or anxiety problems. °· Depression. °· Grief. °· Boredom. °Medical Problems or Conditions °· Anemia. °· Pregnancy. °· Thyroid gland problems. °· Recovery from major surgery. °· Continuous pain. °· Emphysema or asthma that is not well controlled °· Allergic conditions. °· Diabetes. °· Infections (such as mononucleosis). °· Obesity. °· Sleep disorders, such as sleep apnea. °· Heart failure or other heart-related problems. °· Cancer. °· Kidney disease. °· Liver disease. °· Effects of certain medicines such as antihistamines, cough and cold remedies, prescription pain medicines, heart and blood pressure medicines, drugs used for treatment of cancer, and some antidepressants. °SYMPTOMS  °The symptoms of fatigue include:  °· Lack of energy. °· Lack of drive (motivation). °· Drowsiness. °· Feeling of indifference to the surroundings. °DIAGNOSIS  °The details of how you feel help guide your caregiver in finding out what is causing the fatigue. You will be asked about your present and past health condition. It is important to review all medicines that you take, including prescription and non-prescription items. A thorough exam will be done.  You will be questioned about your feelings, habits, and normal lifestyle. Your caregiver may suggest blood tests, urine tests, or other tests to look for common medical causes of fatigue.  °TREATMENT  °Fatigue is treated by correcting the underlying cause. For example, if you have continuous pain or depression, treating these causes will improve how you feel. Similarly, adjusting the dose of certain medicines will help in reducing fatigue.  °HOME CARE INSTRUCTIONS  °· Try to get the required amount of good sleep every night. °· Eat a healthy and nutritious diet, and drink enough water throughout the day. °· Practice ways of relaxing (including yoga or meditation). °· Exercise regularly. °· Make plans to change situations that cause stress. Act on those plans so that stresses decrease over time. Keep your work and personal routine reasonable. °· Avoid street drugs and minimize use of alcohol. °· Start taking a daily multivitamin after consulting your caregiver. °SEEK MEDICAL CARE IF:  °· You have persistent tiredness, which cannot be accounted for. °· You have fever. °· You have unintentional weight loss. °· You have headaches. °· You have disturbed sleep throughout the night. °· You are feeling sad. °· You have constipation. °· You have dry skin. °· You have gained weight. °· You are taking any new or different medicines that you suspect are causing fatigue. °· You are unable to sleep at night. °· You develop any unusual swelling of your legs or other parts of your body. °SEEK IMMEDIATE MEDICAL CARE IF:  °· You are feeling confused. °· Your vision is blurred. °· You feel faint or pass out. °· You develop severe headache. °· You develop severe abdominal, pelvic, or   back pain.  You develop chest pain, shortness of breath, or an irregular or fast heartbeat.  You are unable to pass a normal amount of urine.  You develop abnormal bleeding such as bleeding from the rectum or you vomit blood.  You have thoughts  about harming yourself or committing suicide.  You are worried that you might harm someone else. MAKE SURE YOU:   Understand these instructions.  Will watch your condition.  Will get help right away if you are not doing well or get worse. Document Released: 02/05/2007 Document Revised: 07/03/2011 Document Reviewed: 02/05/2007 [facility] Patient Information 2014 [place].  Shortness of Breath Shortness of breath means you have trouble breathing. Shortness of breath may indicate that you have a medical problem. You should seek immediate medical care for shortness of breath. CAUSES   Not enough oxygen in the air (as with high altitudes or a smoke-filled room).  Short-term (acute) lung disease, including:  Infections, such as pneumonia.  Fluid in the lungs, such as heart failure.  A blood clot in the lungs (pulmonary embolism).  Long-term (chronic) lung diseases.  Heart disease (heart attack, angina, heart failure, and others).  Low red blood cells (anemia).  Poor physical fitness. This can cause shortness of breath when you exercise.  Chest or back injuries or stiffness.  Being overweight.  Smoking.  Anxiety. This can make you feel like you are not getting enough air. DIAGNOSIS  Serious medical problems can usually be found during your physical exam. Tests may also be done to determine why you are having shortness of breath. Tests may include:  Chest X-rays.  Lung function tests.  Blood tests.  Electrocardiography.  Exercise testing.  Echocardiography.  Imaging scans. Your caregiver may not be able to find a cause for your shortness of breath after your exam. In this case, it is important to have a follow-up exam with your caregiver as directed.  TREATMENT  Treatment for shortness of breath depends on the cause of your symptoms and can vary greatly. HOME CARE INSTRUCTIONS   Do not smoke. Smoking is a common cause of shortness of breath. If you smoke,  ask for help to quit.  Avoid being around chemicals or things that may bother your breathing, such as paint fumes and dust.  Rest as needed. Slowly resume your usual activities.  If medicines were prescribed, take them as directed for the full length of time directed. This includes oxygen and any inhaled medicines.  Keep all follow-up appointments as directed by your caregiver. SEEK MEDICAL CARE IF:   Your condition does not improve in the time expected.  You have a hard time doing your normal activities even with rest.  You have any side effects or problems with the medicines prescribed.  You develop any new symptoms. SEEK IMMEDIATE MEDICAL CARE IF:   Your shortness of breath gets worse.  You feel lightheaded, faint, or develop a cough not controlled with medicines.  You start coughing up blood.  You have pain with breathing.  You have chest pain or pain in your arms, shoulders, or abdomen.  You have a fever.  You are unable to walk up stairs or exercise the way you normally do. MAKE SURE YOU:  Understand these instructions.  Will watch your condition.  Will get help right away if you are not doing well or get worse. Document Released: 01/03/2001 Document Revised: 10/10/2011 Document Reviewed: 06/26/2011 [facility] Patient Information 2014 [place].

## 2013-05-03 NOTE — ED Notes (Signed)
Pt with hx of CHF and COPD c/o L sided CP intermittent.  Pt describes as tightening and sharp at times.  Pt states her CP is associated with SOB, diaphoresis, and back pain (especially with deep inspiration).

## 2013-05-03 NOTE — ED Notes (Signed)
Pt. Stated, for the last 2 days Ive had some chest pain and like my heart was racing.  I have also felt a little lightheaded.  I had a little cough.

## 2013-06-24 ENCOUNTER — Emergency Department (HOSPITAL_COMMUNITY)
Admission: EM | Admit: 2013-06-24 | Discharge: 2013-06-25 | Disposition: A | Discharge status: Home / Self Care | Payer: No Typology Code available for payment source | Attending: Emergency Medicine | Admitting: Emergency Medicine

## 2013-06-24 ENCOUNTER — Encounter (HOSPITAL_COMMUNITY): Payer: Self-pay | Admitting: Emergency Medicine

## 2013-06-24 DIAGNOSIS — E669 Obesity, unspecified: Secondary | ICD-10-CM | POA: Insufficient documentation

## 2013-06-24 DIAGNOSIS — S20219A Contusion of unspecified front wall of thorax, initial encounter: Secondary | ICD-10-CM | POA: Diagnosis not present

## 2013-06-24 DIAGNOSIS — I509 Heart failure, unspecified: Secondary | ICD-10-CM | POA: Insufficient documentation

## 2013-06-24 DIAGNOSIS — Z86711 Personal history of pulmonary embolism: Secondary | ICD-10-CM | POA: Diagnosis not present

## 2013-06-24 DIAGNOSIS — J45909 Unspecified asthma, uncomplicated: Secondary | ICD-10-CM | POA: Insufficient documentation

## 2013-06-24 DIAGNOSIS — Z79899 Other long term (current) drug therapy: Secondary | ICD-10-CM | POA: Insufficient documentation

## 2013-06-24 DIAGNOSIS — IMO0002 Reserved for concepts with insufficient information to code with codable children: Secondary | ICD-10-CM | POA: Diagnosis not present

## 2013-06-24 DIAGNOSIS — Z87891 Personal history of nicotine dependence: Secondary | ICD-10-CM | POA: Diagnosis not present

## 2013-06-24 DIAGNOSIS — S139XXA Sprain of joints and ligaments of unspecified parts of neck, initial encounter: Secondary | ICD-10-CM | POA: Insufficient documentation

## 2013-06-24 DIAGNOSIS — Z880 Allergy status to penicillin: Secondary | ICD-10-CM | POA: Insufficient documentation

## 2013-06-24 DIAGNOSIS — Z7982 Long term (current) use of aspirin: Secondary | ICD-10-CM | POA: Insufficient documentation

## 2013-06-24 DIAGNOSIS — S40029A Contusion of unspecified upper arm, initial encounter: Secondary | ICD-10-CM | POA: Diagnosis not present

## 2013-06-24 DIAGNOSIS — S0990XA Unspecified injury of head, initial encounter: Secondary | ICD-10-CM

## 2013-06-24 DIAGNOSIS — F411 Generalized anxiety disorder: Secondary | ICD-10-CM | POA: Insufficient documentation

## 2013-06-24 DIAGNOSIS — Y92410 Unspecified street and highway as the place of occurrence of the external cause: Secondary | ICD-10-CM | POA: Insufficient documentation

## 2013-06-24 DIAGNOSIS — F329 Major depressive disorder, single episode, unspecified: Secondary | ICD-10-CM | POA: Diagnosis not present

## 2013-06-24 DIAGNOSIS — S301XXA Contusion of abdominal wall, initial encounter: Secondary | ICD-10-CM | POA: Insufficient documentation

## 2013-06-24 DIAGNOSIS — S060X1A Concussion with loss of consciousness of 30 minutes or less, initial encounter: Secondary | ICD-10-CM | POA: Insufficient documentation

## 2013-06-24 DIAGNOSIS — F3289 Other specified depressive episodes: Secondary | ICD-10-CM | POA: Diagnosis not present

## 2013-06-24 DIAGNOSIS — S161XXA Strain of muscle, fascia and tendon at neck level, initial encounter: Secondary | ICD-10-CM

## 2013-06-24 DIAGNOSIS — Y9389 Activity, other specified: Secondary | ICD-10-CM | POA: Insufficient documentation

## 2013-06-24 DIAGNOSIS — E119 Type 2 diabetes mellitus without complications: Secondary | ICD-10-CM | POA: Insufficient documentation

## 2013-06-24 DIAGNOSIS — Z85830 Personal history of malignant neoplasm of bone: Secondary | ICD-10-CM | POA: Diagnosis not present

## 2013-06-24 DIAGNOSIS — I10 Essential (primary) hypertension: Secondary | ICD-10-CM | POA: Diagnosis not present

## 2013-06-24 DIAGNOSIS — S40022A Contusion of left upper arm, initial encounter: Secondary | ICD-10-CM

## 2013-06-24 MED ORDER — MORPHINE SULFATE 4 MG/ML IJ SOLN
4.0000 mg | Freq: Once | INTRAMUSCULAR | Status: AC
Start: 2013-06-24 — End: 2013-06-24
  Administered 2013-06-24: 4 mg via INTRAVENOUS
  Filled 2013-06-24: qty 1
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
## 2013-06-25 ENCOUNTER — Emergency Department (HOSPITAL_COMMUNITY): Payer: No Typology Code available for payment source

## 2013-06-25 DIAGNOSIS — S060X1A Concussion with loss of consciousness of 30 minutes or less, initial encounter: Secondary | ICD-10-CM | POA: Diagnosis not present

## 2013-06-25 LAB — I-STAT CHEM 8, ED
BUN: 14 mg/dL (ref 6–23)
Calcium, Ion: 1.25 mmol/L — ABNORMAL HIGH (ref 1.12–1.23)
Chloride: 103 mEq/L (ref 96–112)
Creatinine, Ser: 1.2 mg/dL — ABNORMAL HIGH (ref 0.50–1.10)
Glucose, Bld: 142 mg/dL — ABNORMAL HIGH (ref 70–99)
HCT: 42 % (ref 36.0–46.0)
Hemoglobin: 14.3 g/dL (ref 12.0–15.0)
Potassium: 4 mEq/L (ref 3.7–5.3)
Sodium: 145 mEq/L (ref 137–147)
TCO2: 33 mmol/L (ref 0–100)

## 2013-06-25 LAB — I-STAT TROPONIN, ED: Troponin i, poc: 0 ng/mL (ref 0.00–0.08)

## 2013-06-25 MED ORDER — MORPHINE SULFATE 4 MG/ML IJ SOLN
4.0000 mg | Freq: Once | INTRAMUSCULAR | Status: AC
  Administered 2013-06-25: 4 mg via INTRAVENOUS
  Filled 2013-06-25: qty 1

## 2013-06-25 MED ORDER — IOHEXOL 300 MG/ML  SOLN
100.0000 mL | Freq: Once | INTRAMUSCULAR | Status: AC | PRN
  Administered 2013-06-25: 100 mL via INTRAVENOUS

## 2013-06-25 MED ORDER — OXYCODONE-ACETAMINOPHEN 5-325 MG PO TABS: 2.0000 | ORAL_TABLET | ORAL | Status: DC | PRN
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
## 2013-06-27 ENCOUNTER — Emergency Department (HOSPITAL_COMMUNITY)
Admission: EM | Admit: 2013-06-27 | Discharge: 2013-06-27 | Disposition: A | Discharge status: Home / Self Care | Payer: PRIVATE HEALTH INSURANCE | Attending: Emergency Medicine | Admitting: Emergency Medicine

## 2013-06-27 ENCOUNTER — Emergency Department (HOSPITAL_COMMUNITY): Payer: PRIVATE HEALTH INSURANCE

## 2013-06-27 ENCOUNTER — Encounter (HOSPITAL_COMMUNITY): Payer: Self-pay | Admitting: Emergency Medicine

## 2013-06-27 DIAGNOSIS — J45909 Unspecified asthma, uncomplicated: Secondary | ICD-10-CM | POA: Insufficient documentation

## 2013-06-27 DIAGNOSIS — Z85830 Personal history of malignant neoplasm of bone: Secondary | ICD-10-CM | POA: Insufficient documentation

## 2013-06-27 DIAGNOSIS — M545 Low back pain, unspecified: Secondary | ICD-10-CM | POA: Insufficient documentation

## 2013-06-27 DIAGNOSIS — I10 Essential (primary) hypertension: Secondary | ICD-10-CM | POA: Insufficient documentation

## 2013-06-27 DIAGNOSIS — F3289 Other specified depressive episodes: Secondary | ICD-10-CM | POA: Insufficient documentation

## 2013-06-27 DIAGNOSIS — E119 Type 2 diabetes mellitus without complications: Secondary | ICD-10-CM | POA: Insufficient documentation

## 2013-06-27 DIAGNOSIS — Z880 Allergy status to penicillin: Secondary | ICD-10-CM | POA: Insufficient documentation

## 2013-06-27 DIAGNOSIS — I209 Angina pectoris, unspecified: Secondary | ICD-10-CM | POA: Insufficient documentation

## 2013-06-27 DIAGNOSIS — M25579 Pain in unspecified ankle and joints of unspecified foot: Secondary | ICD-10-CM | POA: Insufficient documentation

## 2013-06-27 DIAGNOSIS — I509 Heart failure, unspecified: Secondary | ICD-10-CM | POA: Insufficient documentation

## 2013-06-27 DIAGNOSIS — Z86711 Personal history of pulmonary embolism: Secondary | ICD-10-CM | POA: Insufficient documentation

## 2013-06-27 DIAGNOSIS — M25519 Pain in unspecified shoulder: Secondary | ICD-10-CM | POA: Insufficient documentation

## 2013-06-27 DIAGNOSIS — Z9889 Other specified postprocedural states: Secondary | ICD-10-CM | POA: Insufficient documentation

## 2013-06-27 DIAGNOSIS — IMO0002 Reserved for concepts with insufficient information to code with codable children: Secondary | ICD-10-CM | POA: Insufficient documentation

## 2013-06-27 DIAGNOSIS — G8911 Acute pain due to trauma: Secondary | ICD-10-CM | POA: Insufficient documentation

## 2013-06-27 DIAGNOSIS — Z79899 Other long term (current) drug therapy: Secondary | ICD-10-CM | POA: Insufficient documentation

## 2013-06-27 DIAGNOSIS — Z87891 Personal history of nicotine dependence: Secondary | ICD-10-CM | POA: Insufficient documentation

## 2013-06-27 DIAGNOSIS — F411 Generalized anxiety disorder: Secondary | ICD-10-CM | POA: Insufficient documentation

## 2013-06-27 DIAGNOSIS — F329 Major depressive disorder, single episode, unspecified: Secondary | ICD-10-CM | POA: Insufficient documentation

## 2013-06-27 DIAGNOSIS — M25549 Pain in joints of unspecified hand: Secondary | ICD-10-CM | POA: Insufficient documentation

## 2013-06-27 DIAGNOSIS — Z7982 Long term (current) use of aspirin: Secondary | ICD-10-CM | POA: Insufficient documentation

## 2013-06-27 MED ORDER — OXYCODONE-ACETAMINOPHEN 5-325 MG PO TABS
1.0000 | ORAL_TABLET | Freq: Once | ORAL | Status: AC
  Administered 2013-06-27: 1 via ORAL
  Filled 2013-06-27: qty 1
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
## 2013-06-27 NOTE — Progress Notes (Signed)
Orthopedic Tech Progress Note Patient Details:  [name] 07-15-1960 [id number]  Ortho Devices Type of Ortho Device: Soft collar Ortho Device/Splint Interventions: Application   Andrian Sabala T 06/27/2013, 8:13 PM
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
## 2013-09-10 ENCOUNTER — Encounter: Payer: PRIVATE HEALTH INSURANCE | Attending: Internal Medicine | Admitting: Dietician

## 2013-09-10 ENCOUNTER — Encounter: Payer: Self-pay | Admitting: Dietician

## 2013-09-10 VITALS — Ht 60.0 in | Wt 226.0 lb

## 2013-09-10 DIAGNOSIS — F329 Major depressive disorder, single episode, unspecified: Secondary | ICD-10-CM | POA: Insufficient documentation

## 2013-09-10 DIAGNOSIS — IMO0001 Reserved for inherently not codable concepts without codable children: Secondary | ICD-10-CM | POA: Insufficient documentation

## 2013-09-10 DIAGNOSIS — F3289 Other specified depressive episodes: Secondary | ICD-10-CM | POA: Insufficient documentation

## 2013-09-10 DIAGNOSIS — E1165 Type 2 diabetes mellitus with hyperglycemia: Secondary | ICD-10-CM

## 2013-09-10 DIAGNOSIS — Z713 Dietary counseling and surveillance: Secondary | ICD-10-CM | POA: Insufficient documentation

## 2013-09-10 DIAGNOSIS — I509 Heart failure, unspecified: Secondary | ICD-10-CM | POA: Insufficient documentation

## 2013-09-10 DIAGNOSIS — E669 Obesity, unspecified: Secondary | ICD-10-CM | POA: Insufficient documentation

## 2013-09-10 NOTE — Patient Instructions (Addendum)
-   Decrease juice, soda and sweet tea intake - 1-3 servings (8oz) in a week - Eat 3 meals a day when possible, snacks when hungry in between meals- have protein at each meal - Continue with decreased sweets intake - Resume water walking when able - patient goal of 5 days a week for 30 min. - Limit sodium intake to around 2000 mg or less - Eat when hungry, stop when satisfied but not full - Use Mrs. Dash instead of Morton's for seasoning without sodium
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
## 2013-09-24 IMAGING — CR DG CHEST 2V
2 series · 2 of 2 positions shown · non-contrast
Comparison: April 22, 2011.

CLINICAL DATA: Chest pain.

CHEST - 2 VIEW

[w chest pa]
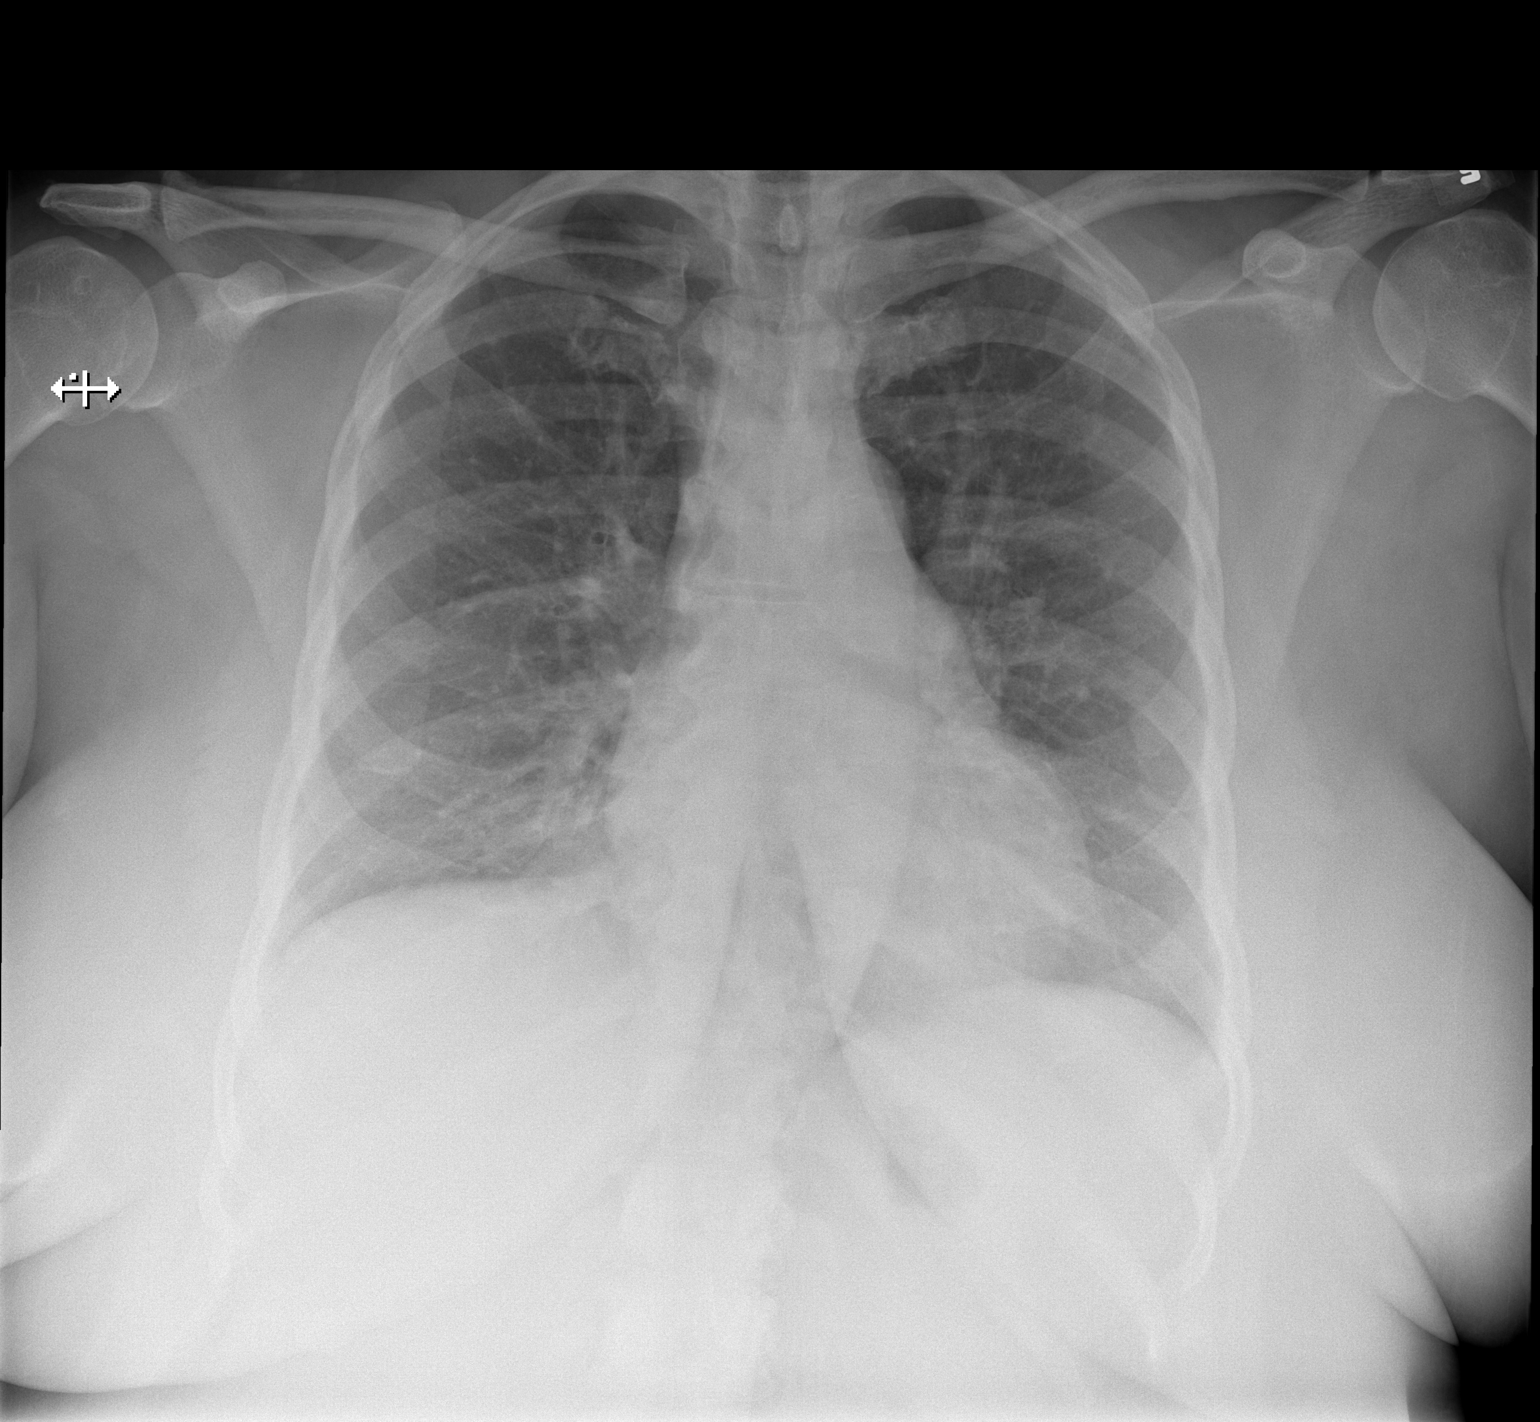

[w chest lat]
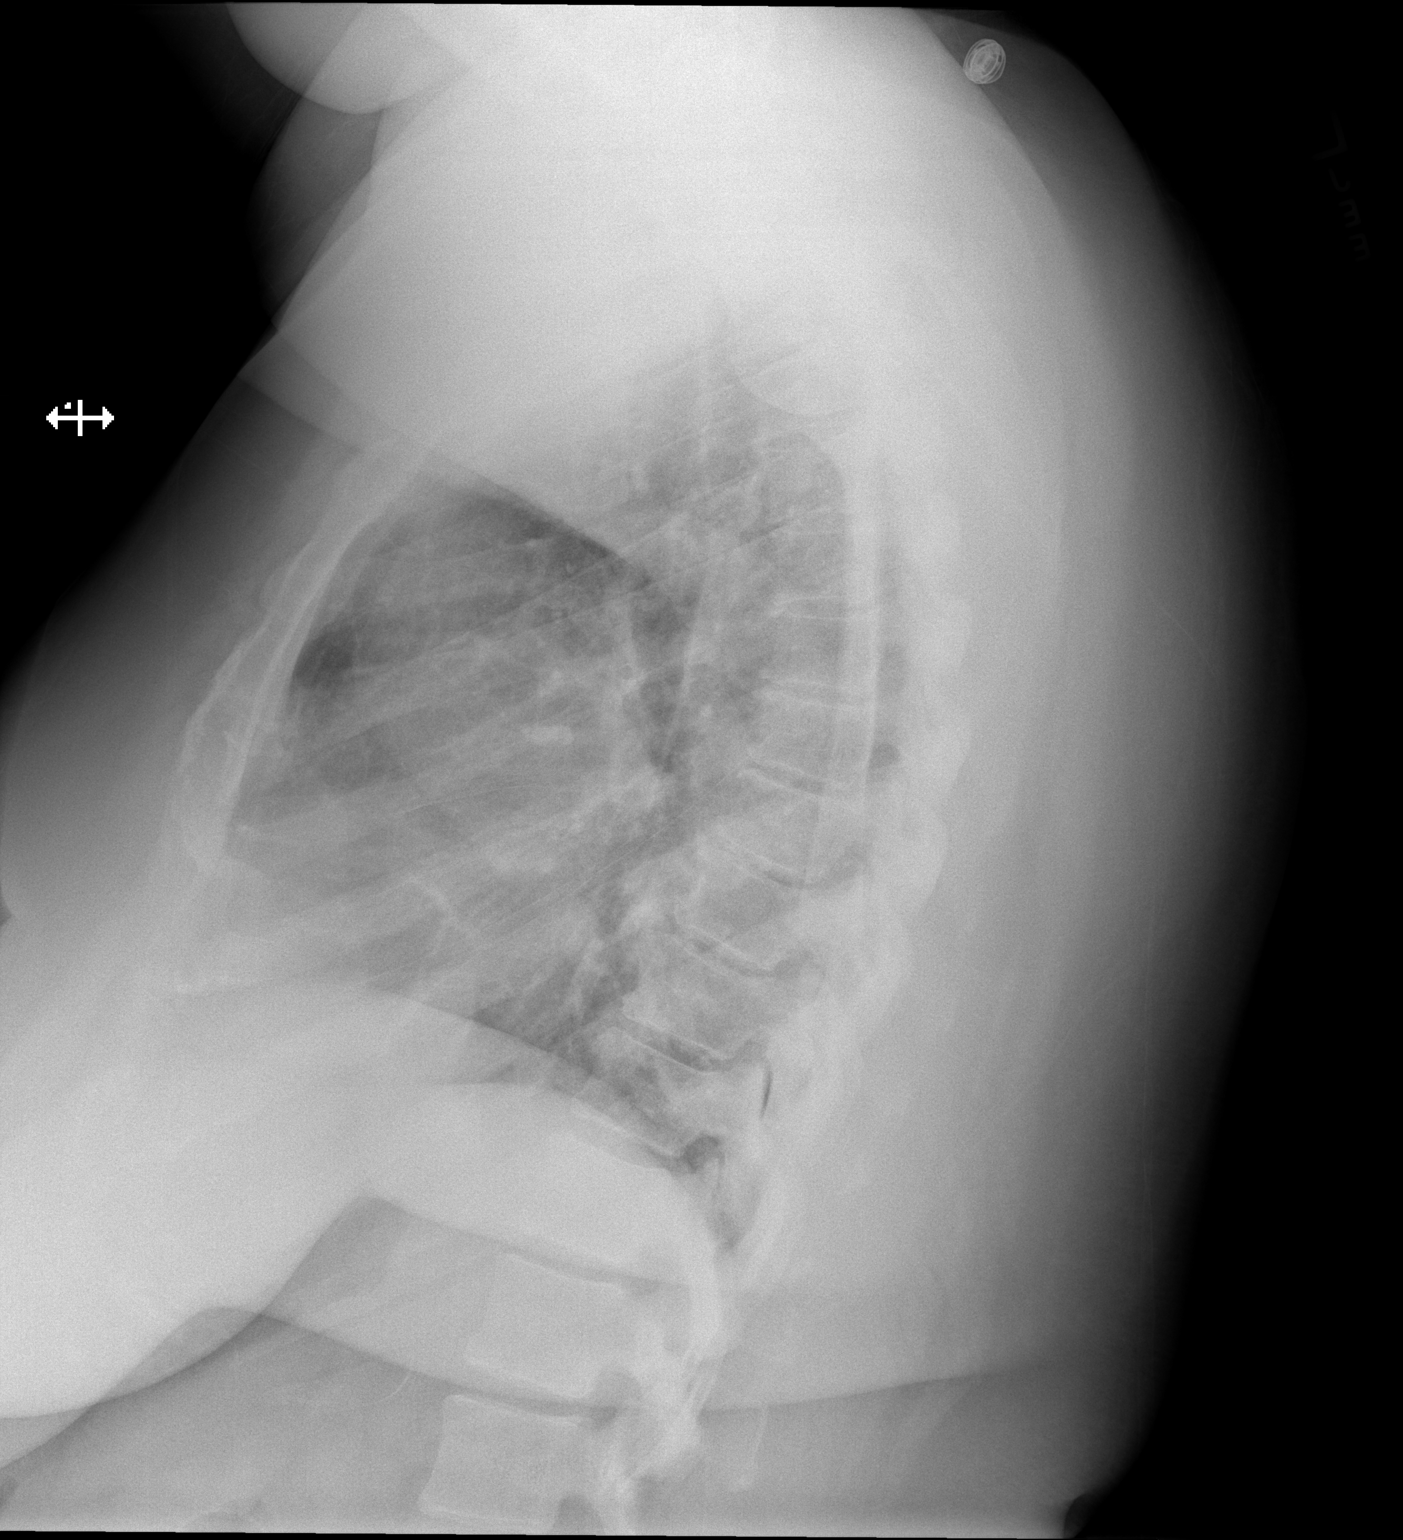

[2 of 2 positions shown; findings below may reference images not displayed]

FINDINGS: Cardiomediastinal silhouette appears normal.  No acute
pulmonary disease is noted.  Bony thorax appears intact.
IMPRESSION: No acute cardiopulmonary abnormality seen.

## 2013-09-29 ENCOUNTER — Encounter: Payer: Self-pay | Admitting: Cardiovascular Disease

## 2013-09-29 ENCOUNTER — Ambulatory Visit (INDEPENDENT_AMBULATORY_CARE_PROVIDER_SITE_OTHER): Payer: PRIVATE HEALTH INSURANCE | Admitting: Cardiovascular Disease

## 2013-09-29 VITALS — BP 130/84 | HR 84 | Ht 62.0 in | Wt 223.6 lb

## 2013-09-29 DIAGNOSIS — I10 Essential (primary) hypertension: Secondary | ICD-10-CM

## 2013-09-29 DIAGNOSIS — E1165 Type 2 diabetes mellitus with hyperglycemia: Secondary | ICD-10-CM

## 2013-09-29 DIAGNOSIS — I5030 Unspecified diastolic (congestive) heart failure: Secondary | ICD-10-CM

## 2013-09-29 DIAGNOSIS — R079 Chest pain, unspecified: Secondary | ICD-10-CM

## 2013-09-29 DIAGNOSIS — E7800 Pure hypercholesterolemia, unspecified: Secondary | ICD-10-CM

## 2013-09-29 DIAGNOSIS — IMO0001 Reserved for inherently not codable concepts without codable children: Secondary | ICD-10-CM

## 2013-09-29 DIAGNOSIS — I509 Heart failure, unspecified: Secondary | ICD-10-CM

## 2013-09-29 NOTE — Assessment & Plan Note (Signed)
Well controlled.  Continue current medications and low sodium Dash type diet.    

## 2013-09-29 NOTE — Assessment & Plan Note (Signed)
Euvolemic no recurrent hospitalization continue current meds

## 2013-09-29 NOTE — Assessment & Plan Note (Signed)
Discussed low carb diet.  Target hemoglobin A1c is 6.5 or less.  Continue current medications.  

## 2013-09-29 NOTE — Assessment & Plan Note (Signed)
Cholesterol is at goal.  Continue current dose of statin and diet Rx.  No myalgias or side effects.  F/U  LFT's in 6 months. Lab Results  Component Value Date   LDLCALC 103* 04/23/2011   LDLCALC 102* 04/23/2011  REcent labs with Dr Stana Bunting medical

## 2013-09-29 NOTE — Progress Notes (Signed)
Patient ID: [name], female   DOB: January 01, 1961, 53 y.o.   MRN: [id number] 53 yo  Previously seen by Dr Martinique with normal heart cath in 2008.  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 Past medical history of diet- controlled diabetes mellitus but started on pills a cuple of months ago, hypertension, obstructive sleep apnea who  I am asked to evaluate for chest pain. The patient did undergo cardiac catheterization in December 2008. At that time, she was found to have  no coronary artery disease and normal LV function. She apparently has had intermittent chest pain for years. She was treated in January 2008  with Coumadin for 6 months for a question of pulmonary embolus on CT. She did have her last CTA in October 2011, which showed no pulmonary  Embolus.  Recent URI with worsening dyspnea. Mild chronic LE edema. Atypica sharp pains in back and shoulder. Sedentary. Quit smoking many years ago  No fever or cough. No hemoptysis. NO change in LE edema. Compliant with meds but not always diet  Admitted to hospital during holidays. Myovue done and normal   She has lost 15 lbs since last visit  Going to the [employer] a bit and walking her [place]ie     ROS: Denies fever, malais, weight loss, blurry vision, decreased visual acuity, cough, sputum, SOB, hemoptysis, pleuritic pain, palpitaitons, heartburn, abdominal pain, melena, lower extremity edema, claudication, or rash.  All other systems reviewed and negative  General: Affect appropriate Obese black female  HEENT: normal Neck supple with no adenopathy JVP normal no bruits no thyromegaly Lungs clear with no wheezing and good diaphragmatic motion Heart:  S1/S2 no murmur, no rub, gallop or click PMI normal Abdomen: benighn, BS positve, no tenderness, no AAA no bruit.  No HSM or HJR Distal pulses intact with no bruits No edema Neuro non-focal Skin warm and dry No muscular weakness   Current Outpatient Prescriptions  Medication Sig Dispense  Refill  . ACCU-CHEK FASTCLIX LANCETS MISC As directed      . ACCU-CHEK SMARTVIEW test strip As directed      . acetaminophen-codeine (TYLENOL #3) 300-30 MG per tablet Take 1 tablet by mouth every 4 (four) hours as needed for moderate pain.      [name] albuterol (PROVENTIL,VENTOLIN) 90 MCG/ACT inhaler Inhale 2 puffs into the lungs every 4 (four) hours as needed for shortness of breath.       [name] aspirin EC 81 MG tablet Take 81 mg by mouth daily.      [name] atorvastatin (LIPITOR) 40 MG tablet Take 40 mg by mouth at bedtime.       . beclomethasone (QVAR) 80 MCG/ACT inhaler Inhale 1 puff into the lungs 2 (two) times daily.      . carvedilol (COREG CR) 10 MG 24 hr capsule Take 10 mg by mouth daily.      . Exenatide ER (BYDUREON) 2 MG PEN Inject 2 mg into the skin.      . furosemide (LASIX) 40 MG tablet Take 40 mg by mouth daily.      [name] gabapentin (NEURONTIN) 100 MG capsule Take 100 mg by mouth 3 (three) times daily.      . isosorbide dinitrate (ISORDIL) 20 MG tablet Take 20 mg by mouth 2 (two) times daily.       [name] KLOR-CON M10 10 MEQ tablet 2 (two) times daily.      [name] lisinopril (PRINIVIL,ZESTRIL) 20 MG tablet Take 20 mg by mouth daily.       [name]  loperamide (IMODIUM) 2 MG capsule Take 2 mg by mouth as needed for diarrhea or loose stools.      [name] loratadine (CLARITIN) 10 MG tablet Take 10 mg by mouth daily.      . metFORMIN (GLUCOPHAGE) 1000 MG tablet Take 1,000 mg by mouth 2 (two) times daily with a meal.      . Multiple Vitamins-Minerals (MULTIVITAMIN PO) Take 1 tablet by mouth daily.      . nitroGLYCERIN (NITROSTAT) 0.4 MG SL tablet Place 0.4 mg under the tongue every 5 (five) minutes as needed. For chest pain.      [name] omeprazole (PRILOSEC) 20 MG capsule Take 20 mg by mouth daily.        [name] oxyCODONE-acetaminophen (PERCOCET) 5-325 MG per tablet Take 2 tablets by mouth every 4 (four) hours as needed.  20 tablet  0  . potassium chloride (K-DUR) 10 MEQ tablet Take 20 mEq by mouth 2 (two) times daily.      .  sertraline (ZOLOFT) 100 MG tablet Take 100 mg by mouth daily.       [name] triamcinolone cream (KENALOG) 0.1 % Apply 1 application topically 2 (two) times daily.       No current facility-administered medications for this visit.    Allergies  Hydrocodone; Penicillins; and Sulfa antibiotics  Electrocardiogram:  NSR normal ECG   Assessment and Plan

## 2013-09-29 NOTE — Assessment & Plan Note (Signed)
Non recurrent previously negative stress test and w/u  Observe

## 2013-09-29 NOTE — Patient Instructions (Signed)
Your physician wants you to follow-up in: YEAR WITH DR NISHAN  You will receive a reminder letter in the mail two months in advance. If you don't receive a letter, please call our office to schedule the follow-up appointment.  Your physician recommends that you continue on your current medications as directed. Please refer to the Current Medication list given to you today. 

## 2013-10-06 IMAGING — CR DG CHEST 2V
2 series · 2 of 2 positions shown · non-contrast
Comparison: Chest x-ray 11/20/2011.

CLINICAL DATA: Chest pain, shortness of breath and cough.

CHEST - 2 VIEW

[w chest pa]
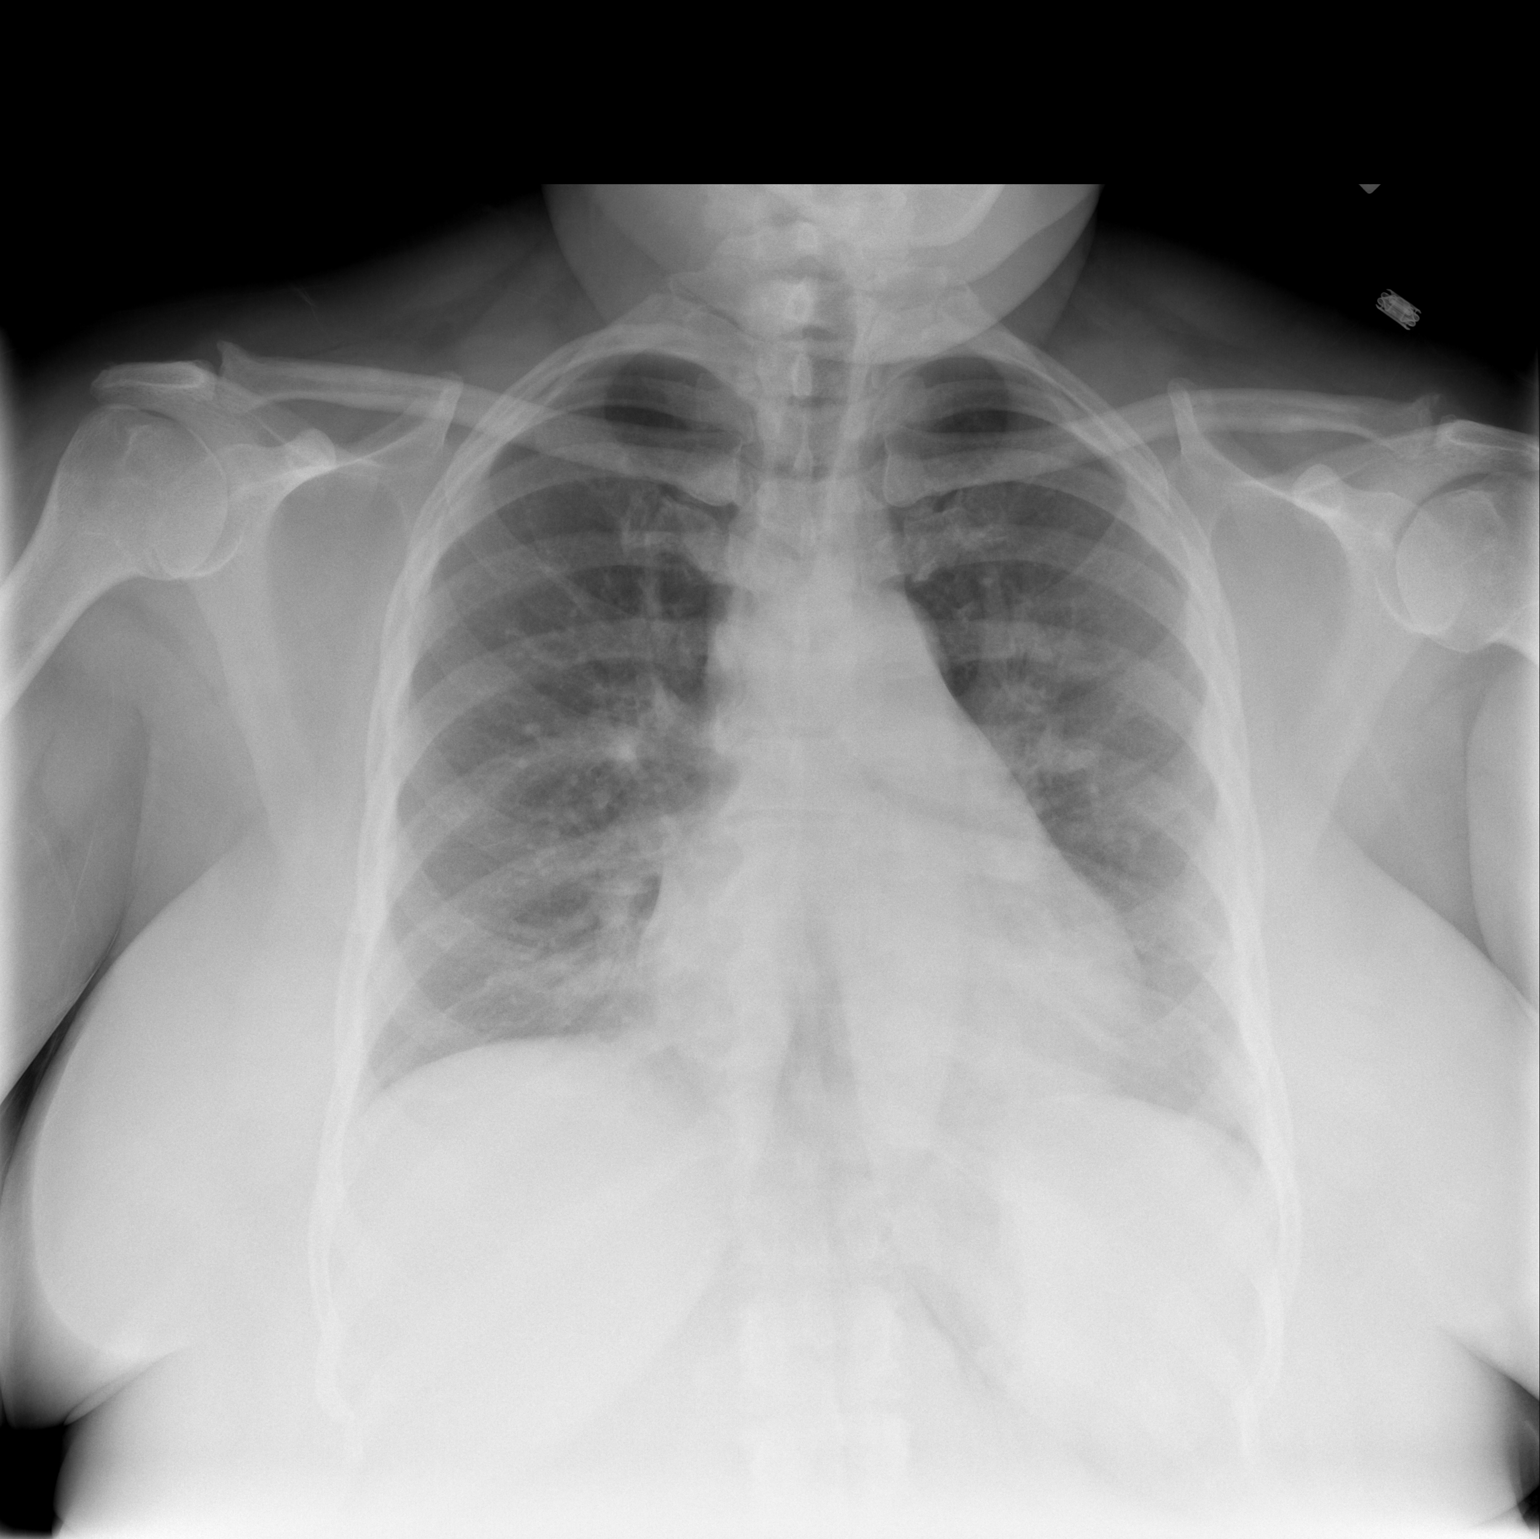

[w chest lat]
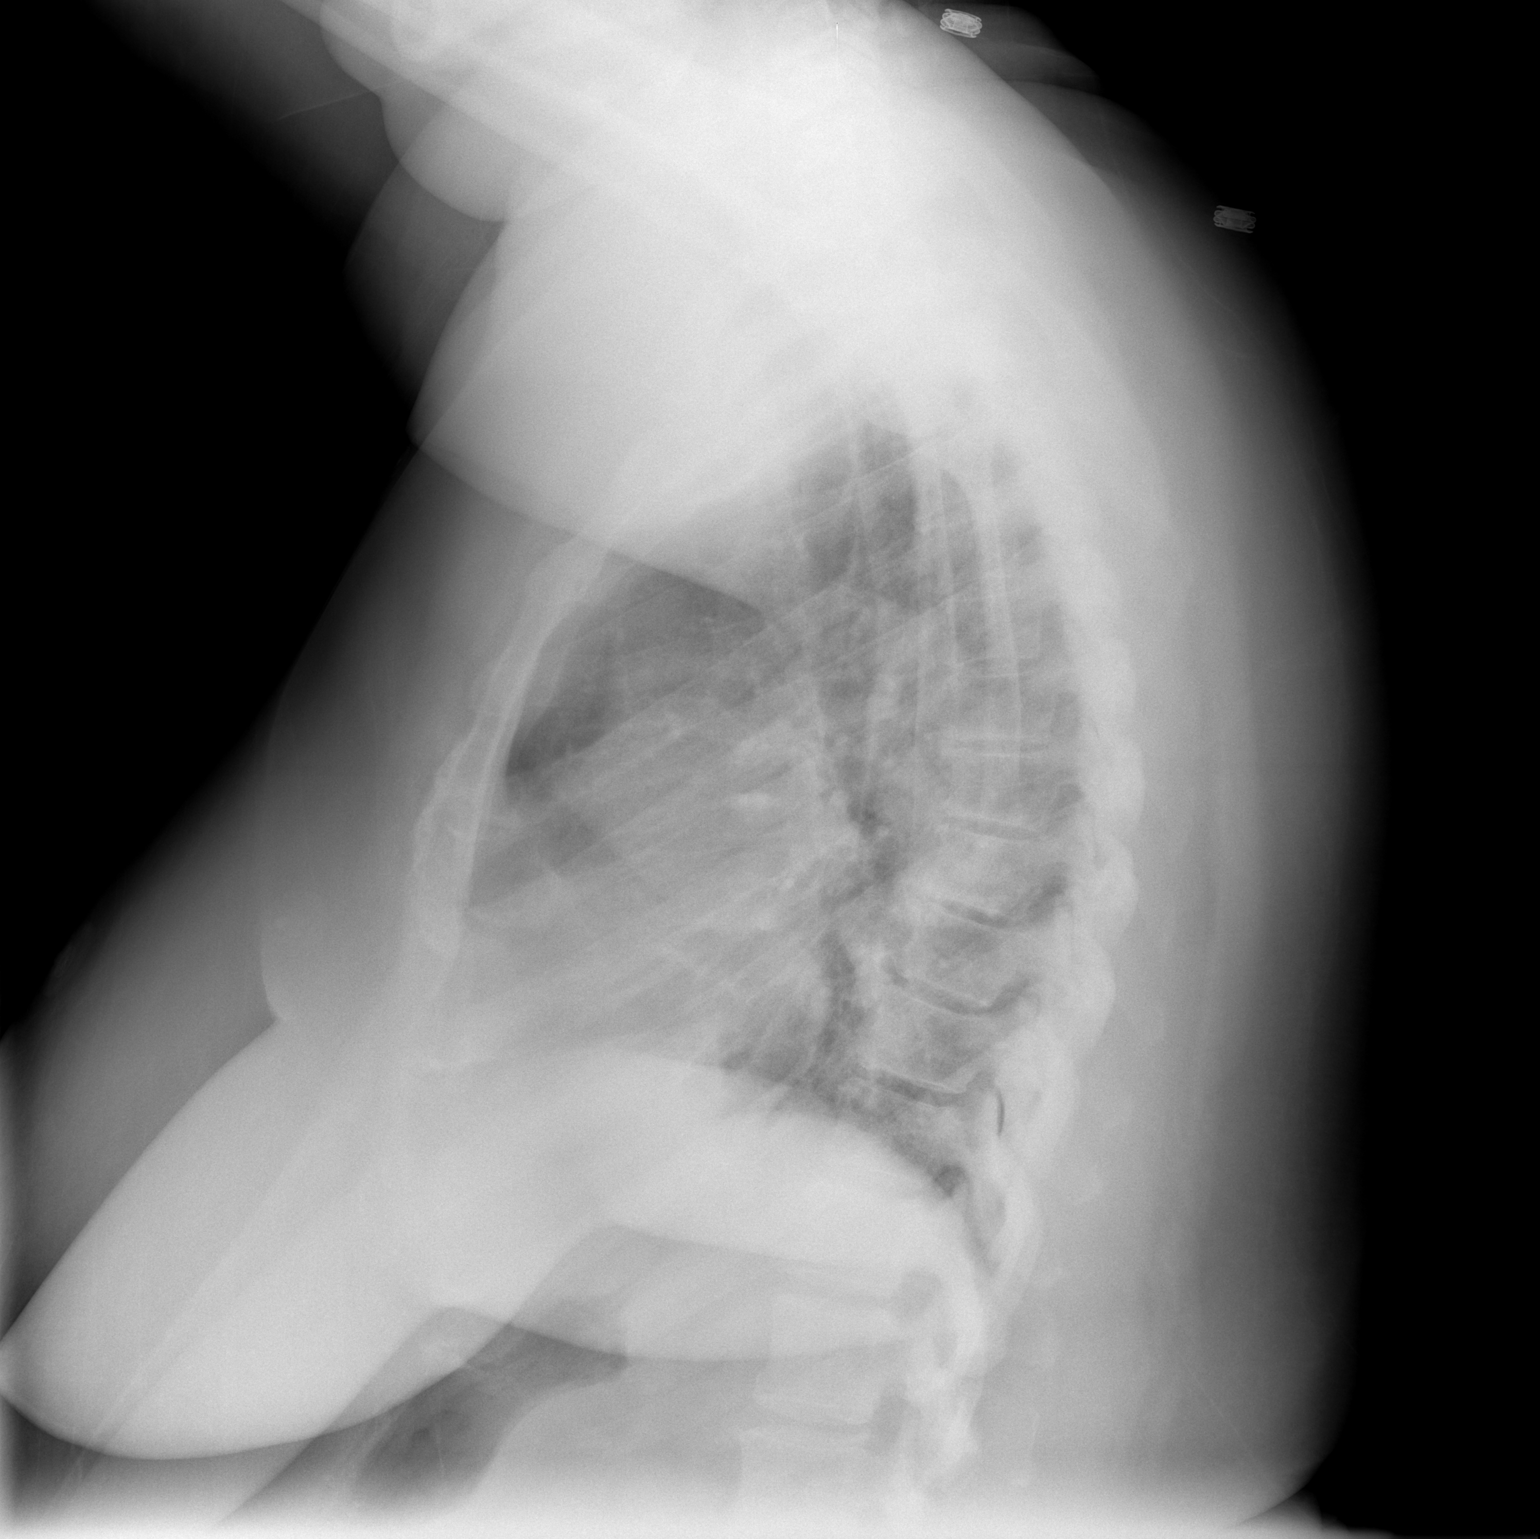

[2 of 2 positions shown; findings below may reference images not displayed]

FINDINGS: Lung volumes are normal.  No consolidative airspace
disease.  No pleural effusions.  No pneumothorax.  No pulmonary
nodule or mass noted.  Pulmonary vasculature and the
cardiomediastinal silhouette are within normal limits.
IMPRESSION: 1. No radiographic evidence of acute cardiopulmonary disease.

## 2013-10-27 ENCOUNTER — Other Ambulatory Visit (HOSPITAL_COMMUNITY): Payer: Self-pay | Admitting: Internal Medicine

## 2013-10-27 DIAGNOSIS — Z1231 Encounter for screening mammogram for malignant neoplasm of breast: Secondary | ICD-10-CM

## 2013-10-29 ENCOUNTER — Ambulatory Visit: Payer: PRIVATE HEALTH INSURANCE | Admitting: Dietician

## 2013-10-30 ENCOUNTER — Ambulatory Visit (HOSPITAL_COMMUNITY)
Admission: RE | Admit: 2013-10-30 | Discharge: 2013-10-30 | Disposition: A | Discharge status: Home / Self Care | Payer: PRIVATE HEALTH INSURANCE | Source: Ambulatory Visit | Attending: Internal Medicine | Admitting: Internal Medicine

## 2013-10-30 DIAGNOSIS — Z1231 Encounter for screening mammogram for malignant neoplasm of breast: Secondary | ICD-10-CM | POA: Insufficient documentation

## 2013-11-12 ENCOUNTER — Ambulatory Visit: Payer: PRIVATE HEALTH INSURANCE | Admitting: Dietician

## 2013-11-12 IMAGING — CT CT ANGIO CHEST
2 of 6 series · 19 of 46 positions shown · IV contrast (APPLIED)
Comparison: One view chest 01/08/2012.  C t a chest 06/06/2011.

CLINICAL DATA: Chest tightness.  Shortness of breath.

CT ANGIOGRAPHY CHEST
TECHNIQUE: Multidetector CT imaging of the chest using the
standard protocol during bolus administration of intravenous
contrast. Multiplanar reconstructed images including MIPs were
obtained and reviewed to evaluate the vascular anatomy.
Contrast: 100mL OMNIPAQUE IOHEXOL 350 MG/ML SOLN

[Series 7: pulm embolism 1.0 b25f thin · axial · 0.77mm/px · z∈[+1118,+1348]mm · 16 of 254 slices shown]
[im 12/254  lung]
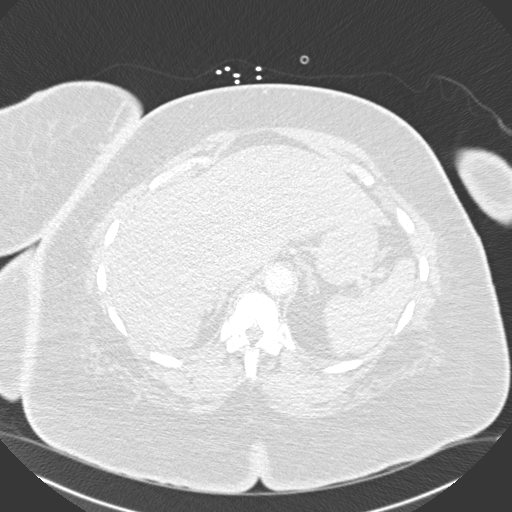
[im 34/254  soft-tissue]
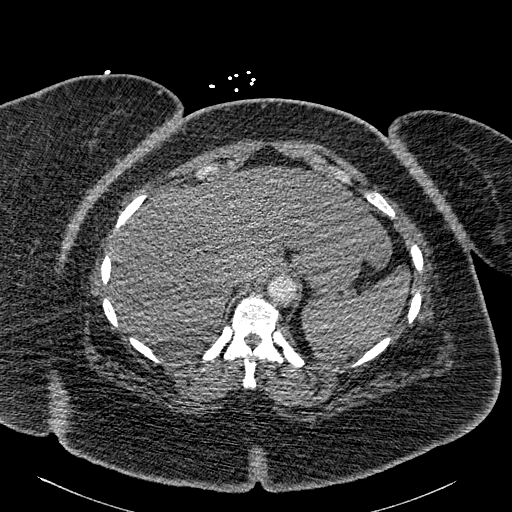
[im 45/254  lung]
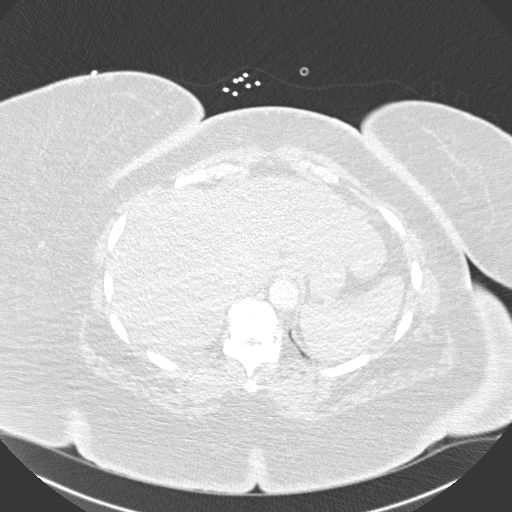
[im 56/254  soft-tissue]
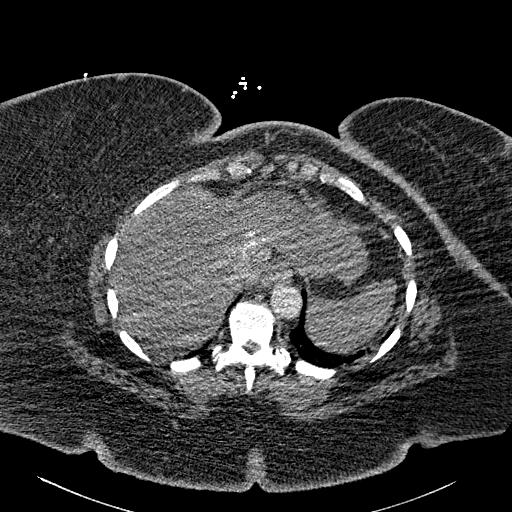
[im 78/254  lung]
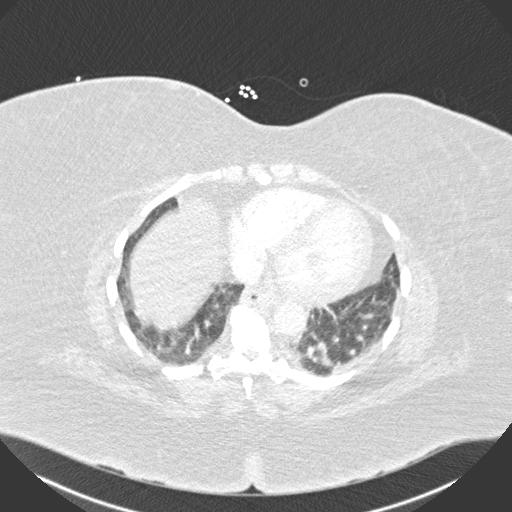
[im 89/254  soft-tissue]
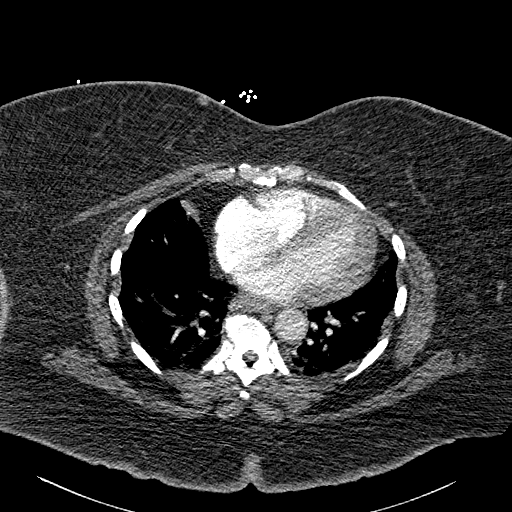
[im 100/254  lung]
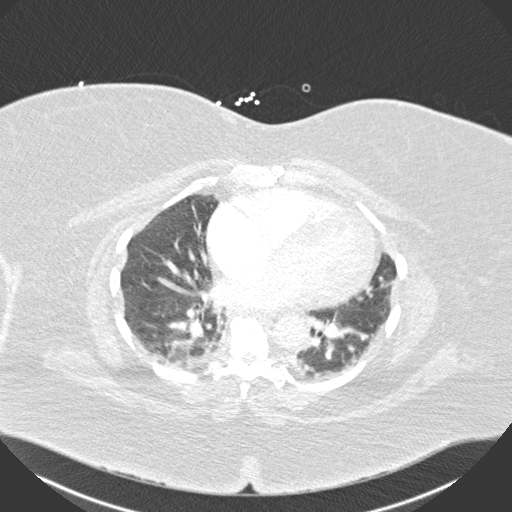
[im 122/254  soft-tissue]
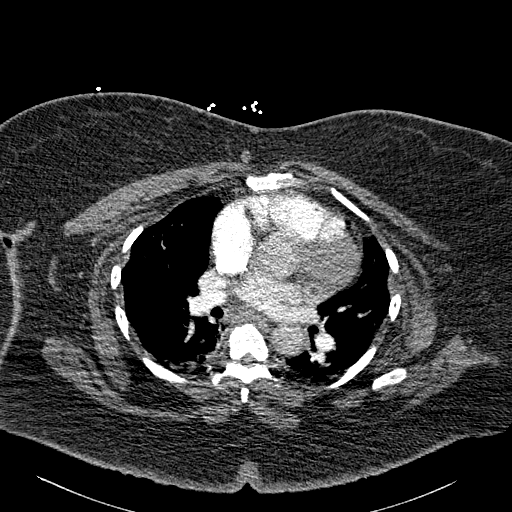
[im 133/254  lung]
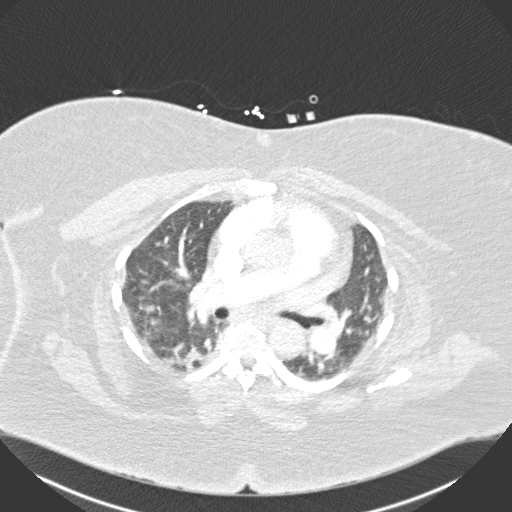
[im 155/254  soft-tissue]
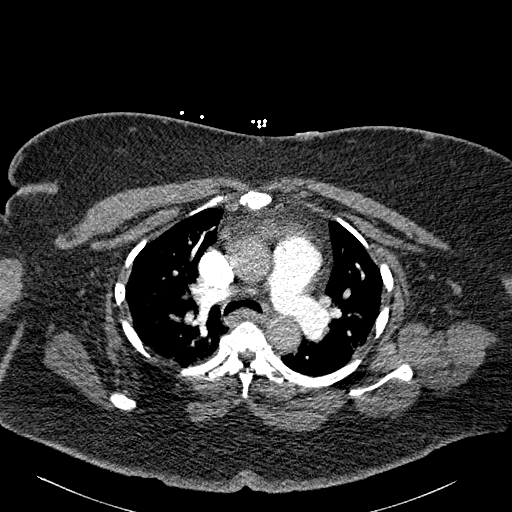
[im 166/254  lung]
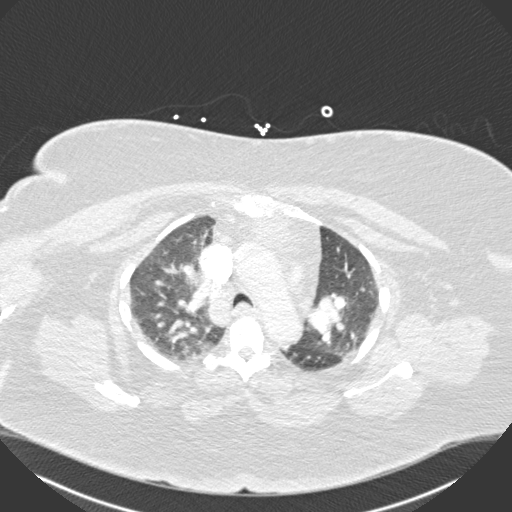
[im 177/254  soft-tissue]
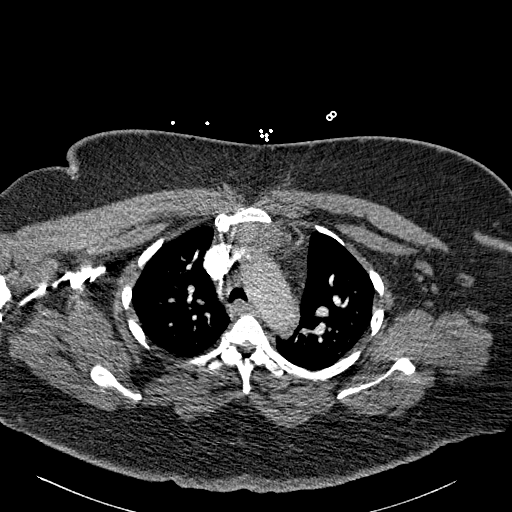
[im 199/254  lung]
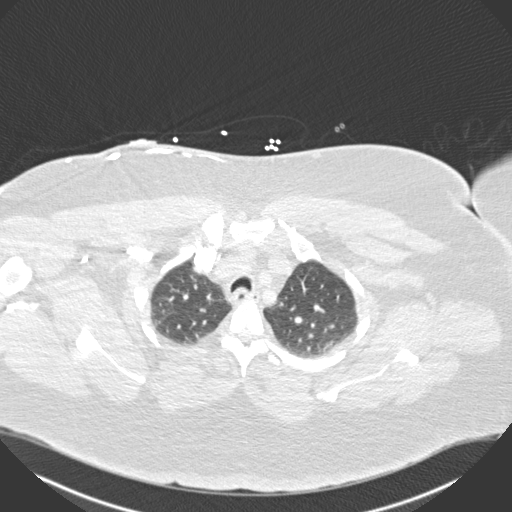
[im 210/254  soft-tissue]
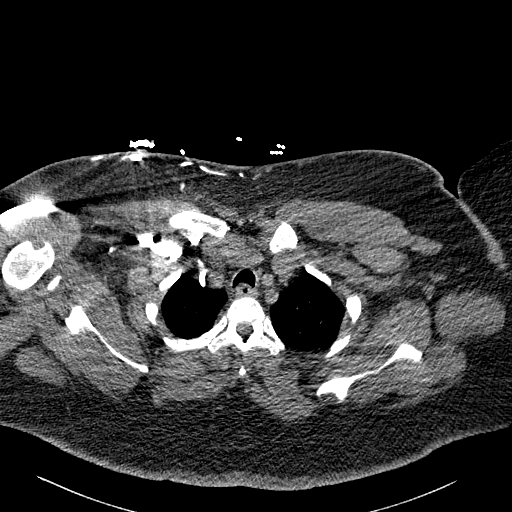
[im 221/254  lung]
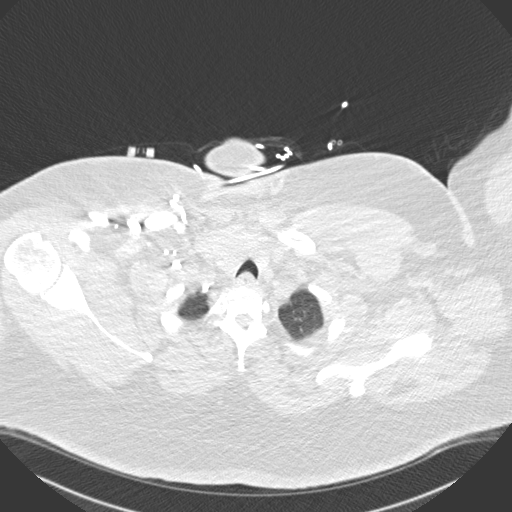
[im 243/254  soft-tissue]
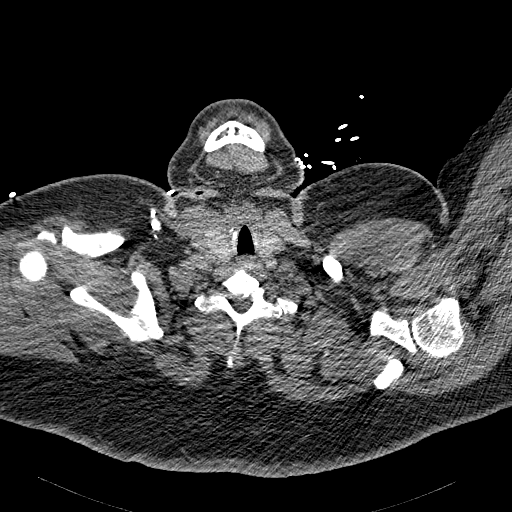

[Series 604: coronal · coronal · 0.77mm/px · 3 of 86 slices shown]
[im 22/86  soft-tissue]
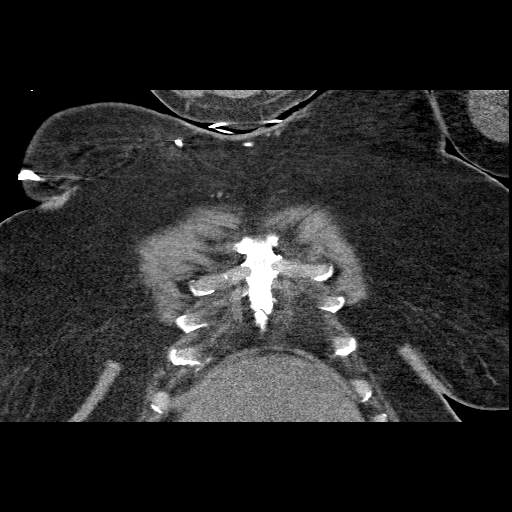
[im 43/86  soft-tissue]
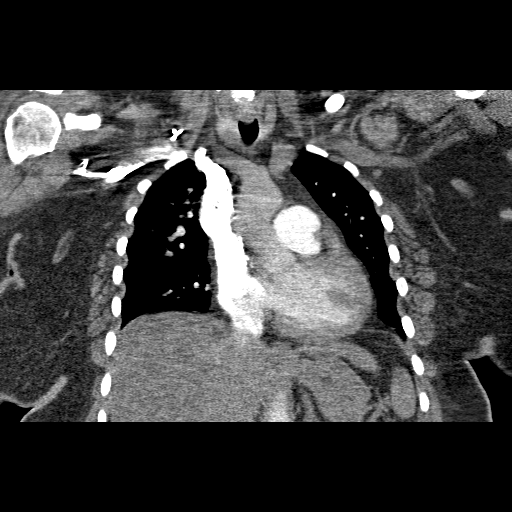
[im 64/86  soft-tissue]
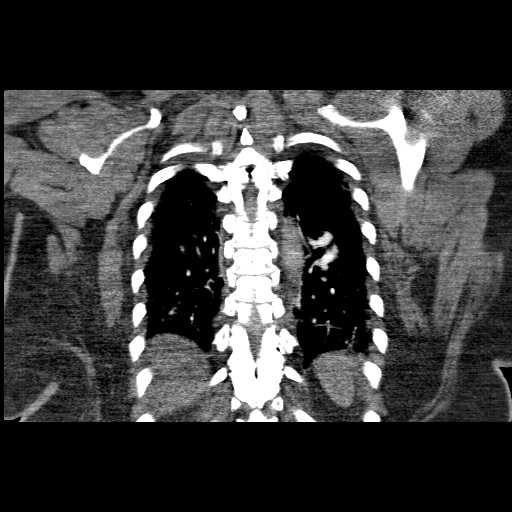

[19 of 46 positions shown; findings below may reference images not displayed]

FINDINGS: Pulmonary arterial opacification is satisfactory.  The
study is mildly degraded by patient motion and body habitus.  No
focal filling defects are evident to suggest pulmonary embolus.

The heart size is normal.  No significant pleural or pericardial
effusion is present.

A prominent thyroid goiter is stable.  An anterior mediastinal mass
is slowly growing over time when compared with studies from 6338.
Mild diffuse ground-glass attenuation is compatible with edema or
atelectasis.  There is dependent atelectasis bilaterally as well.
No focal nodule mass or airspace consolidation is present
otherwise.

The bone windows demonstrate mild degenerative changes.  No focal
lytic or blastic lesions are evident.
IMPRESSION: 1.  No evidence of pulmonary embolus.
2.  Low lung volumes with edema or atelectasis.
3.  Slight interval increase in anterior mediastinal nodules, most
likely representing benign thymoma.

## 2013-11-12 IMAGING — CR DG CHEST 1V PORT
1 series · 1 of 1 positions shown · non-contrast
Comparison: Portable exam 7886 hours compared to 12/02/2011

CLINICAL DATA: Shortness of breath, hypertension, diabetes, former
smoker, asthma, COPD

PORTABLE CHEST - 1 VIEW

[AP]
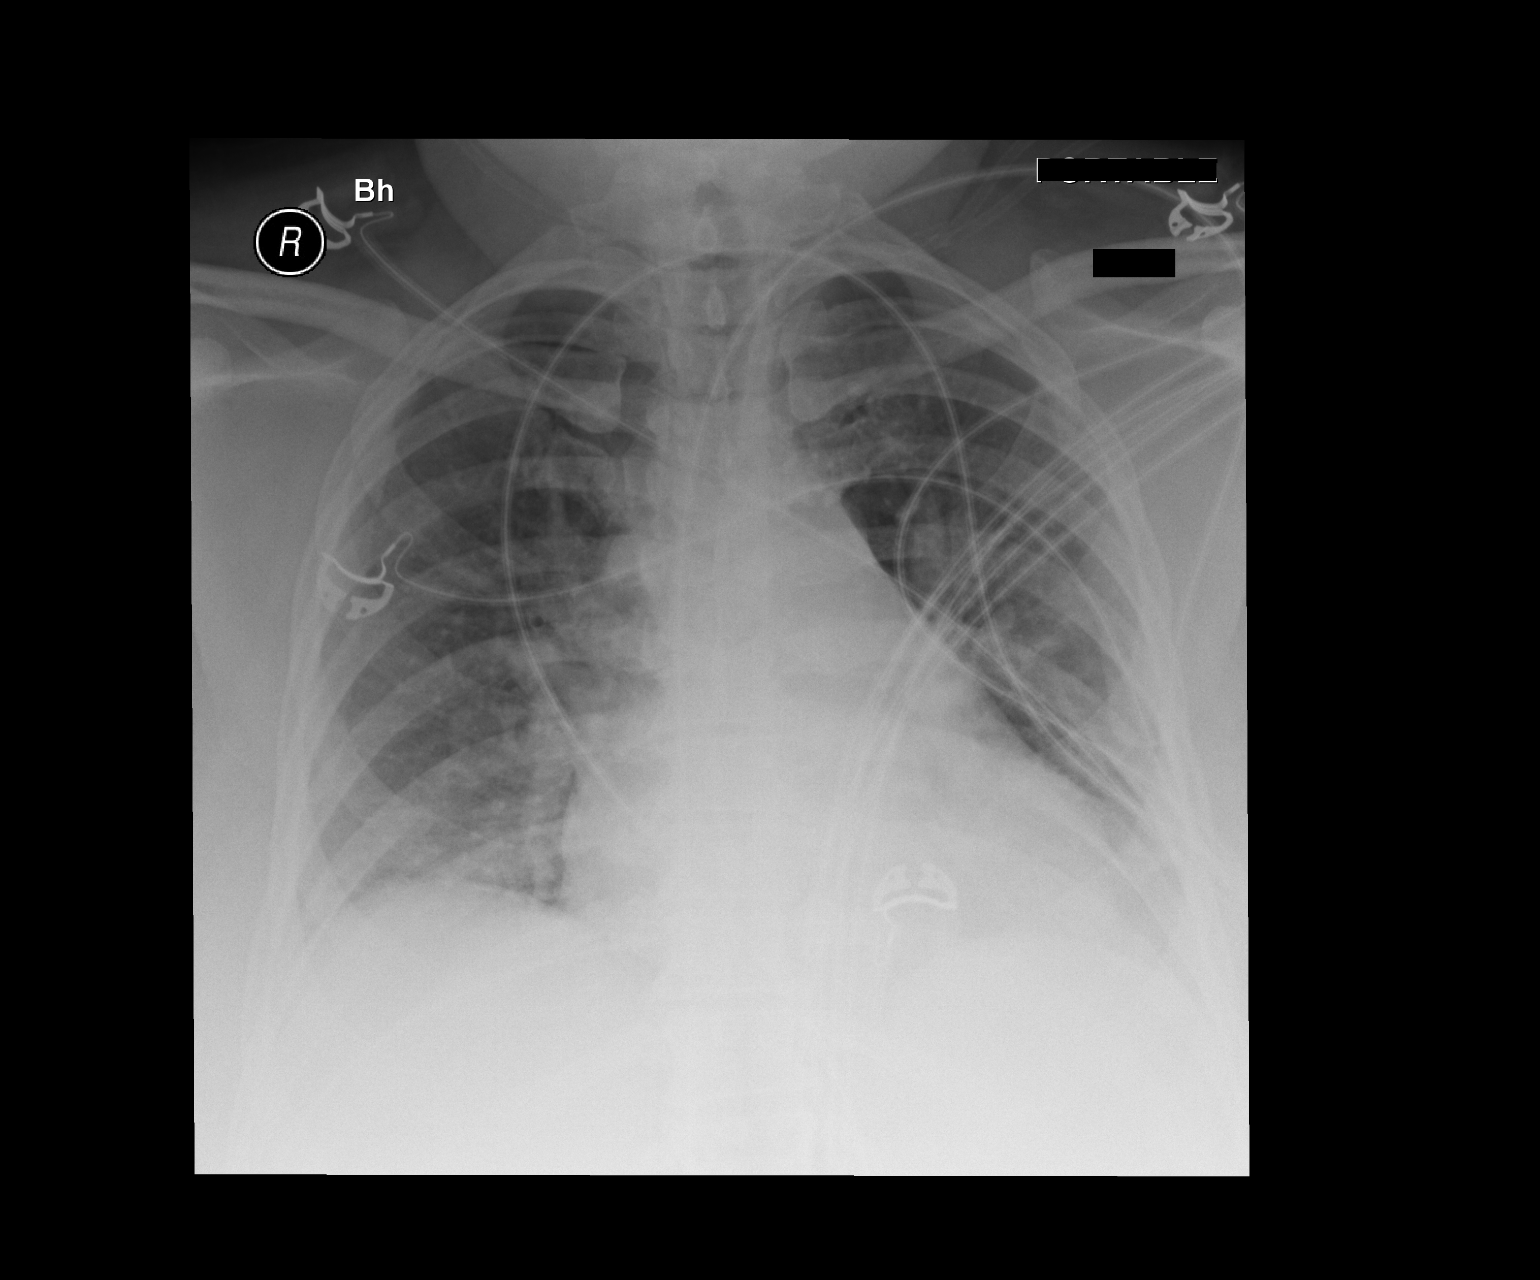

[1 of 1 positions shown; findings below may reference images not displayed]

FINDINGS: Enlargement of cardiac silhouette.
Mediastinal contours and pulmonary vascularity normal.
Right basilar atelectasis.
Remaining lungs grossly clear.
No pleural effusion or pneumothorax.
Numerous cardiac monitoring leads project over chest.
IMPRESSION: Enlargement of cardiac silhouette.
Right basilar atelectasis.

## 2013-11-14 IMAGING — CR DG SHOULDER 2+V*R*
3 series · 3 of 3 positions shown · non-contrast
Comparison: None.

CLINICAL DATA: No injury.  Anterior right shoulder pain.

RIGHT SHOULDER - 2+ VIEW

[w shoulder external right]
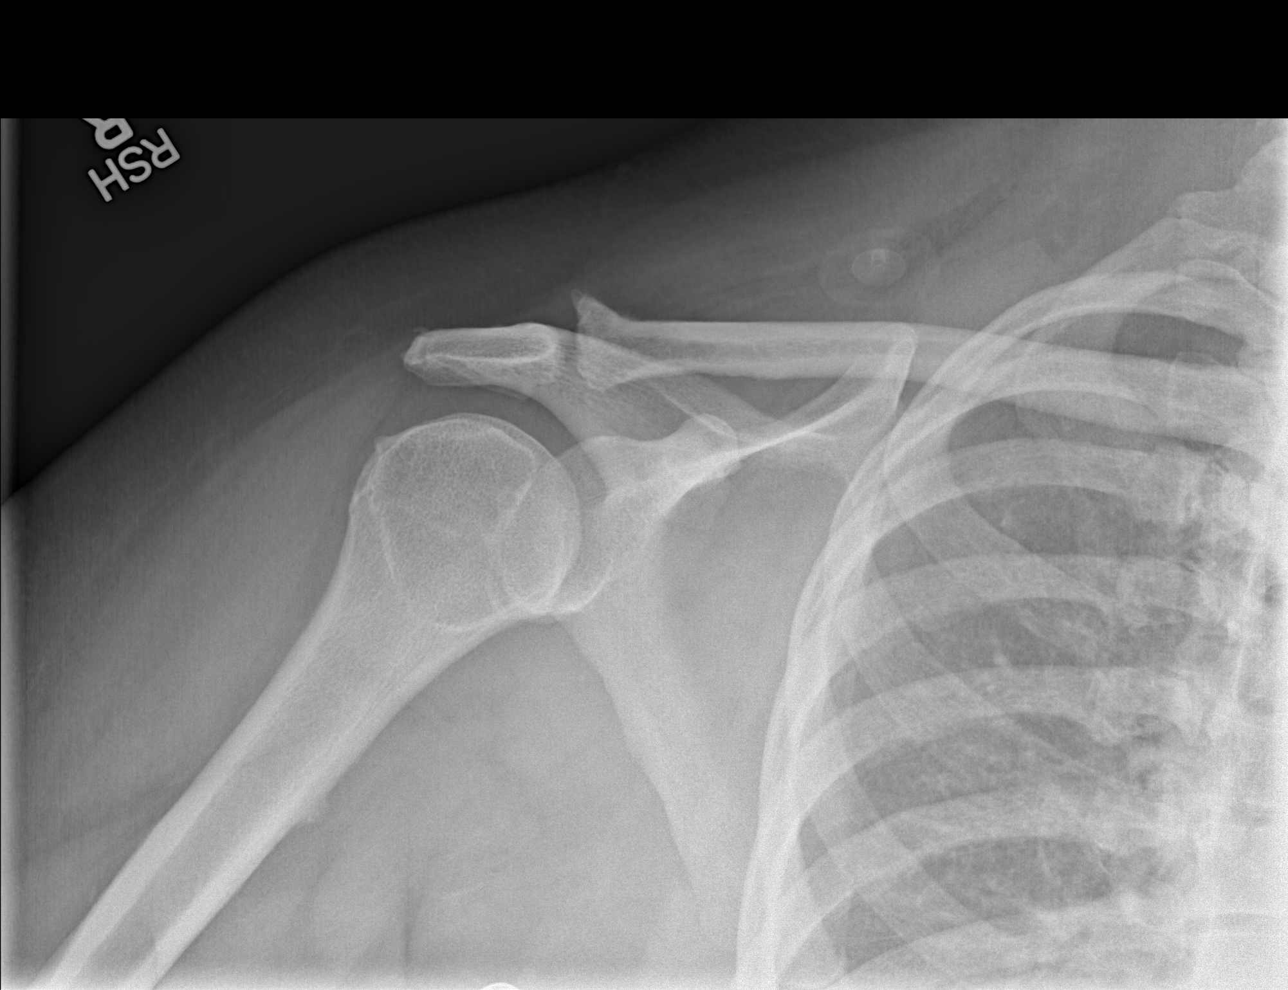

[w shoulder internal right]
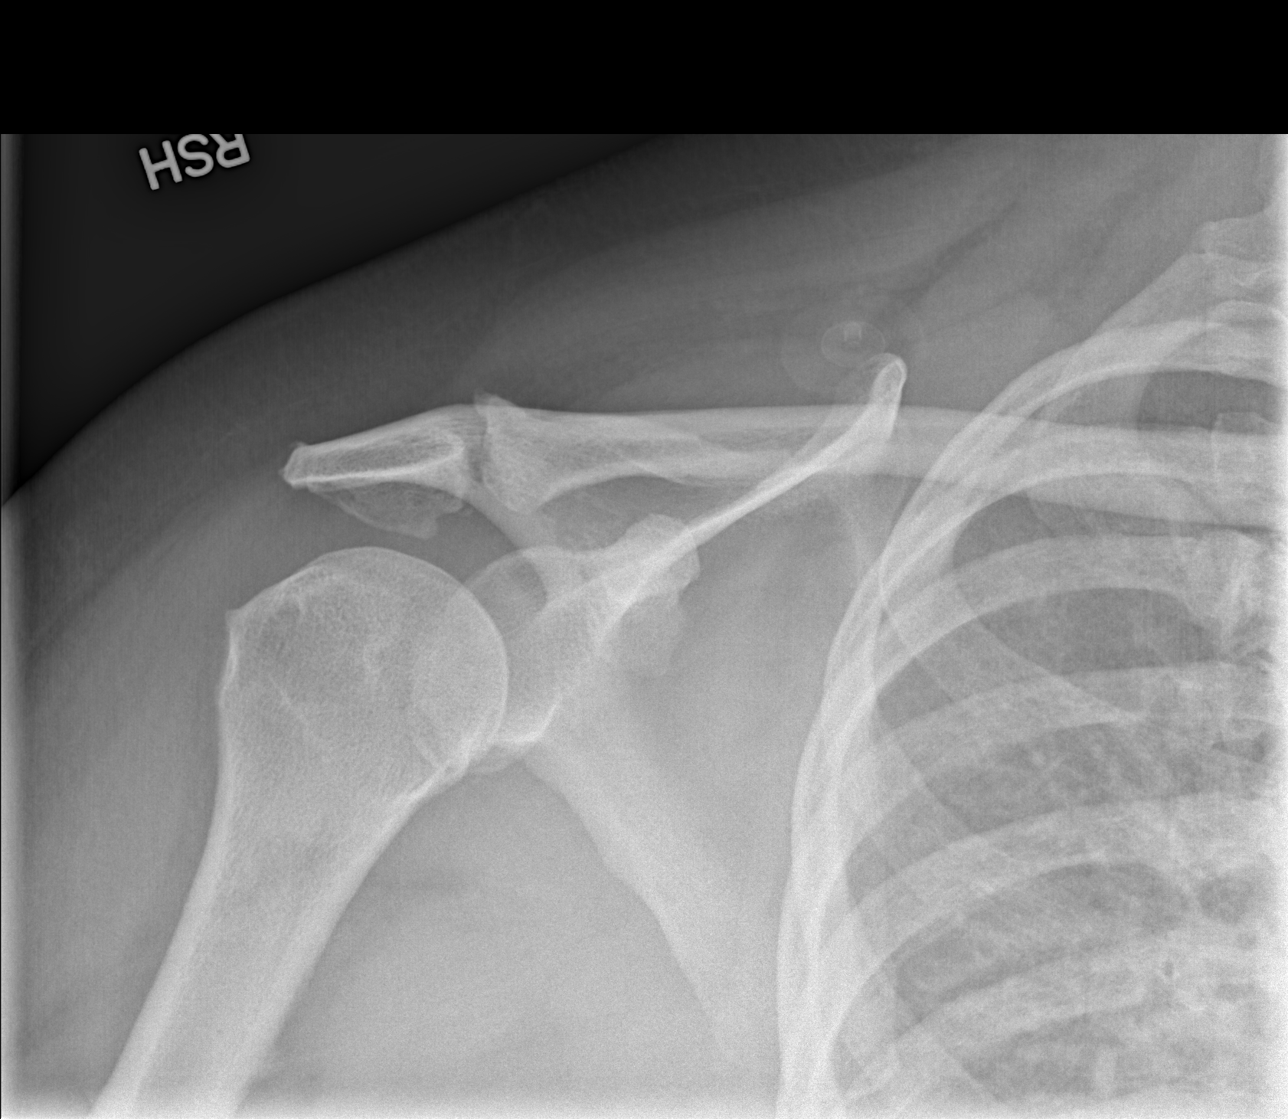

[w shoulder y-view right]
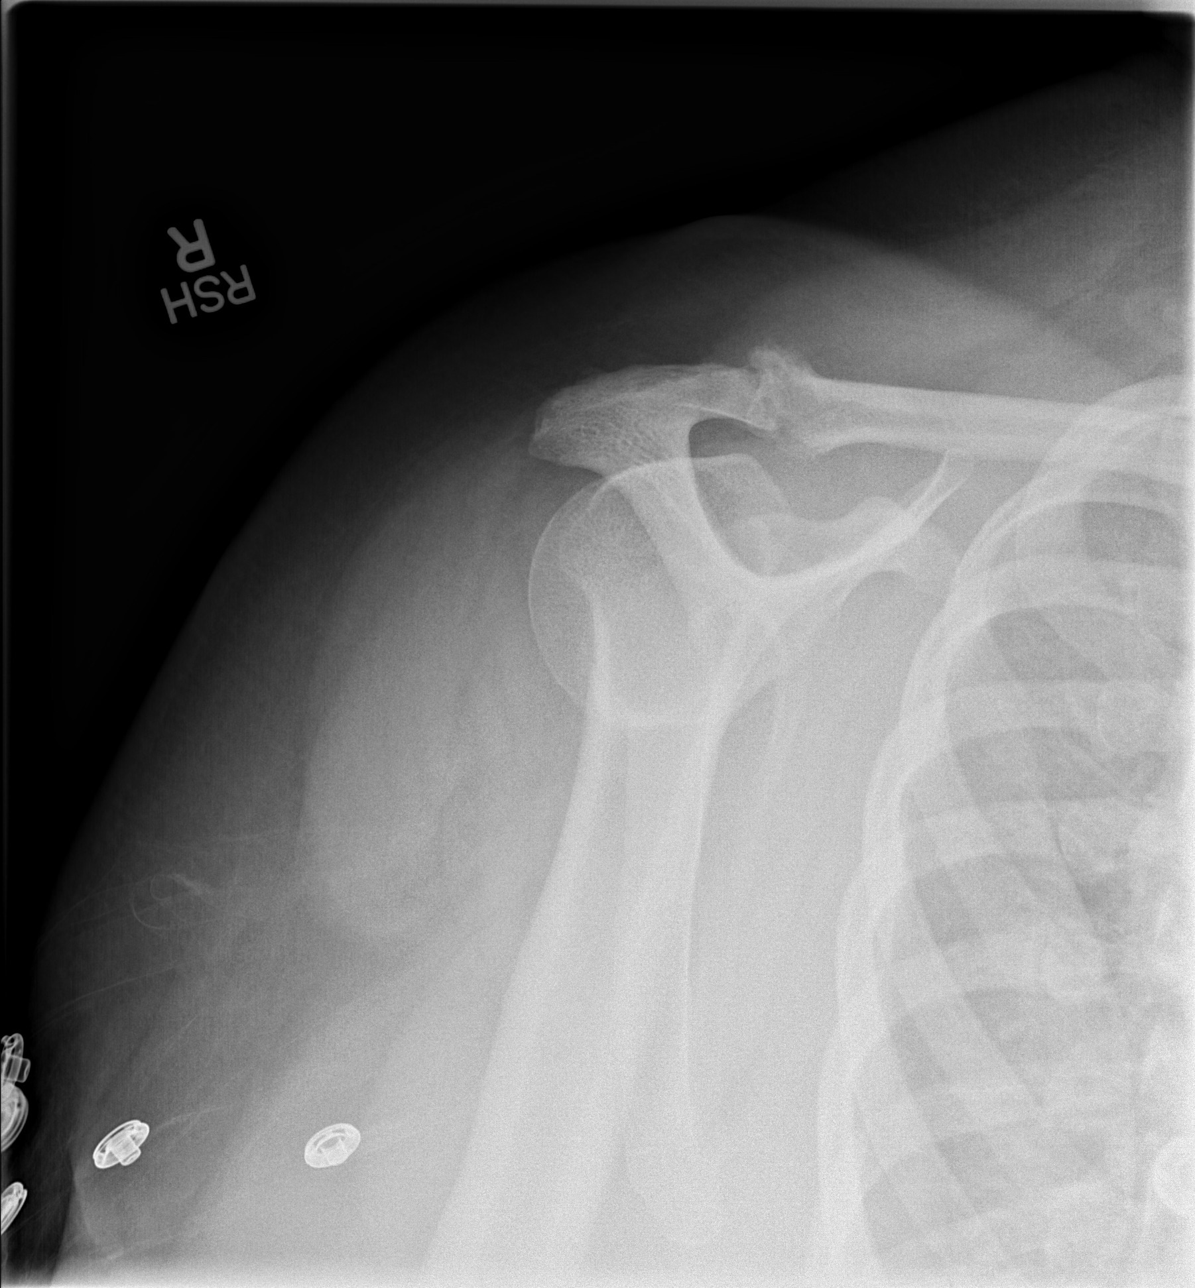

[3 of 3 positions shown; findings below may reference images not displayed]

FINDINGS: Prominent acromioclavicular joint degenerative changes
with bony overgrowth most notable involving the distal clavicle.

Minimal spurring lateral aspect humeral head suggestive of result
of degenerative changes.

Visualized lungs clear.
IMPRESSION: Prominent acromioclavicular joint degenerative changes with bony
overgrowth most notable involving the distal clavicle.

Minimal spurring lateral aspect humeral head suggestive of result
of degenerative changes.

## 2013-11-28 IMAGING — CR DG CHEST 2V
2 series · 2 of 2 positions shown · non-contrast
Comparison: 01/08/2012 CT

CLINICAL DATA: Chest pain

CHEST - 2 VIEW

[w chest pa]
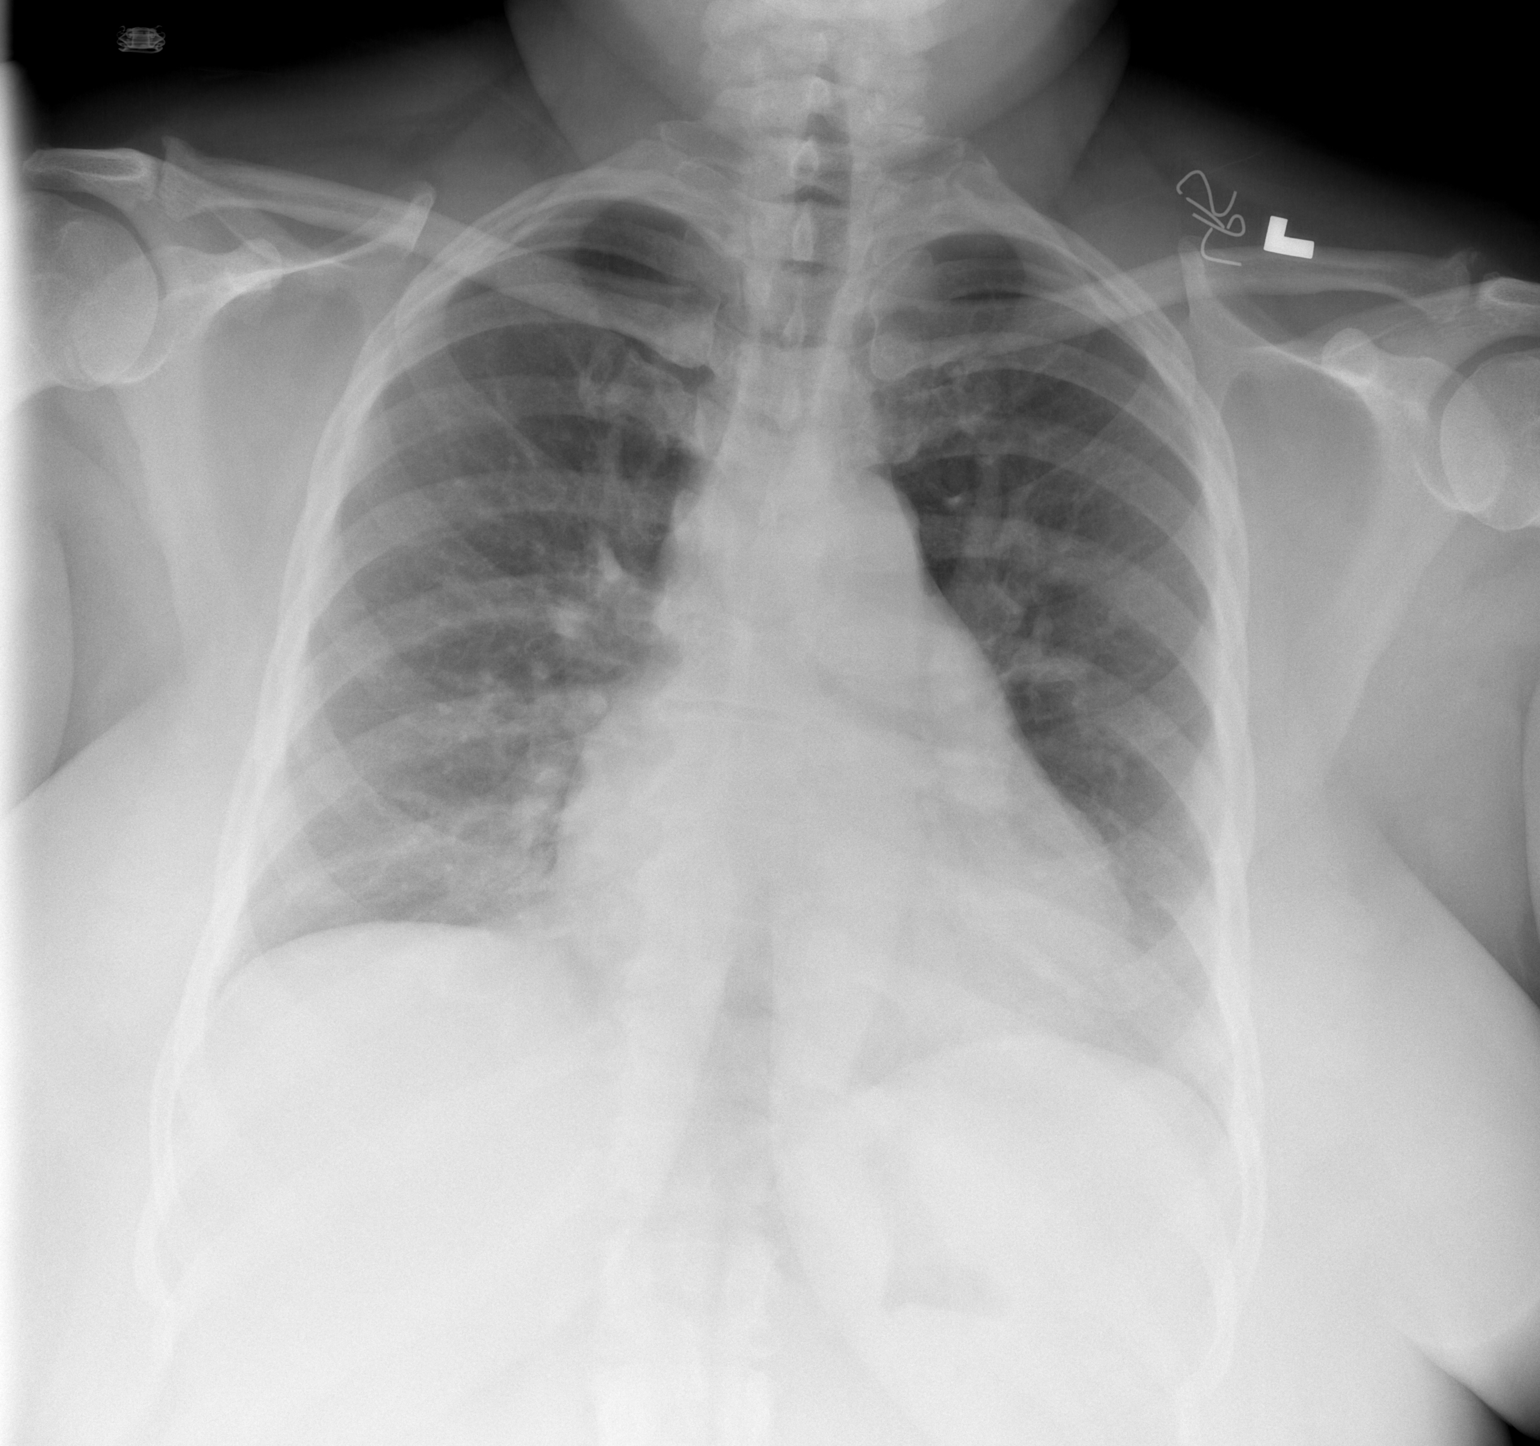

[w chest lat]
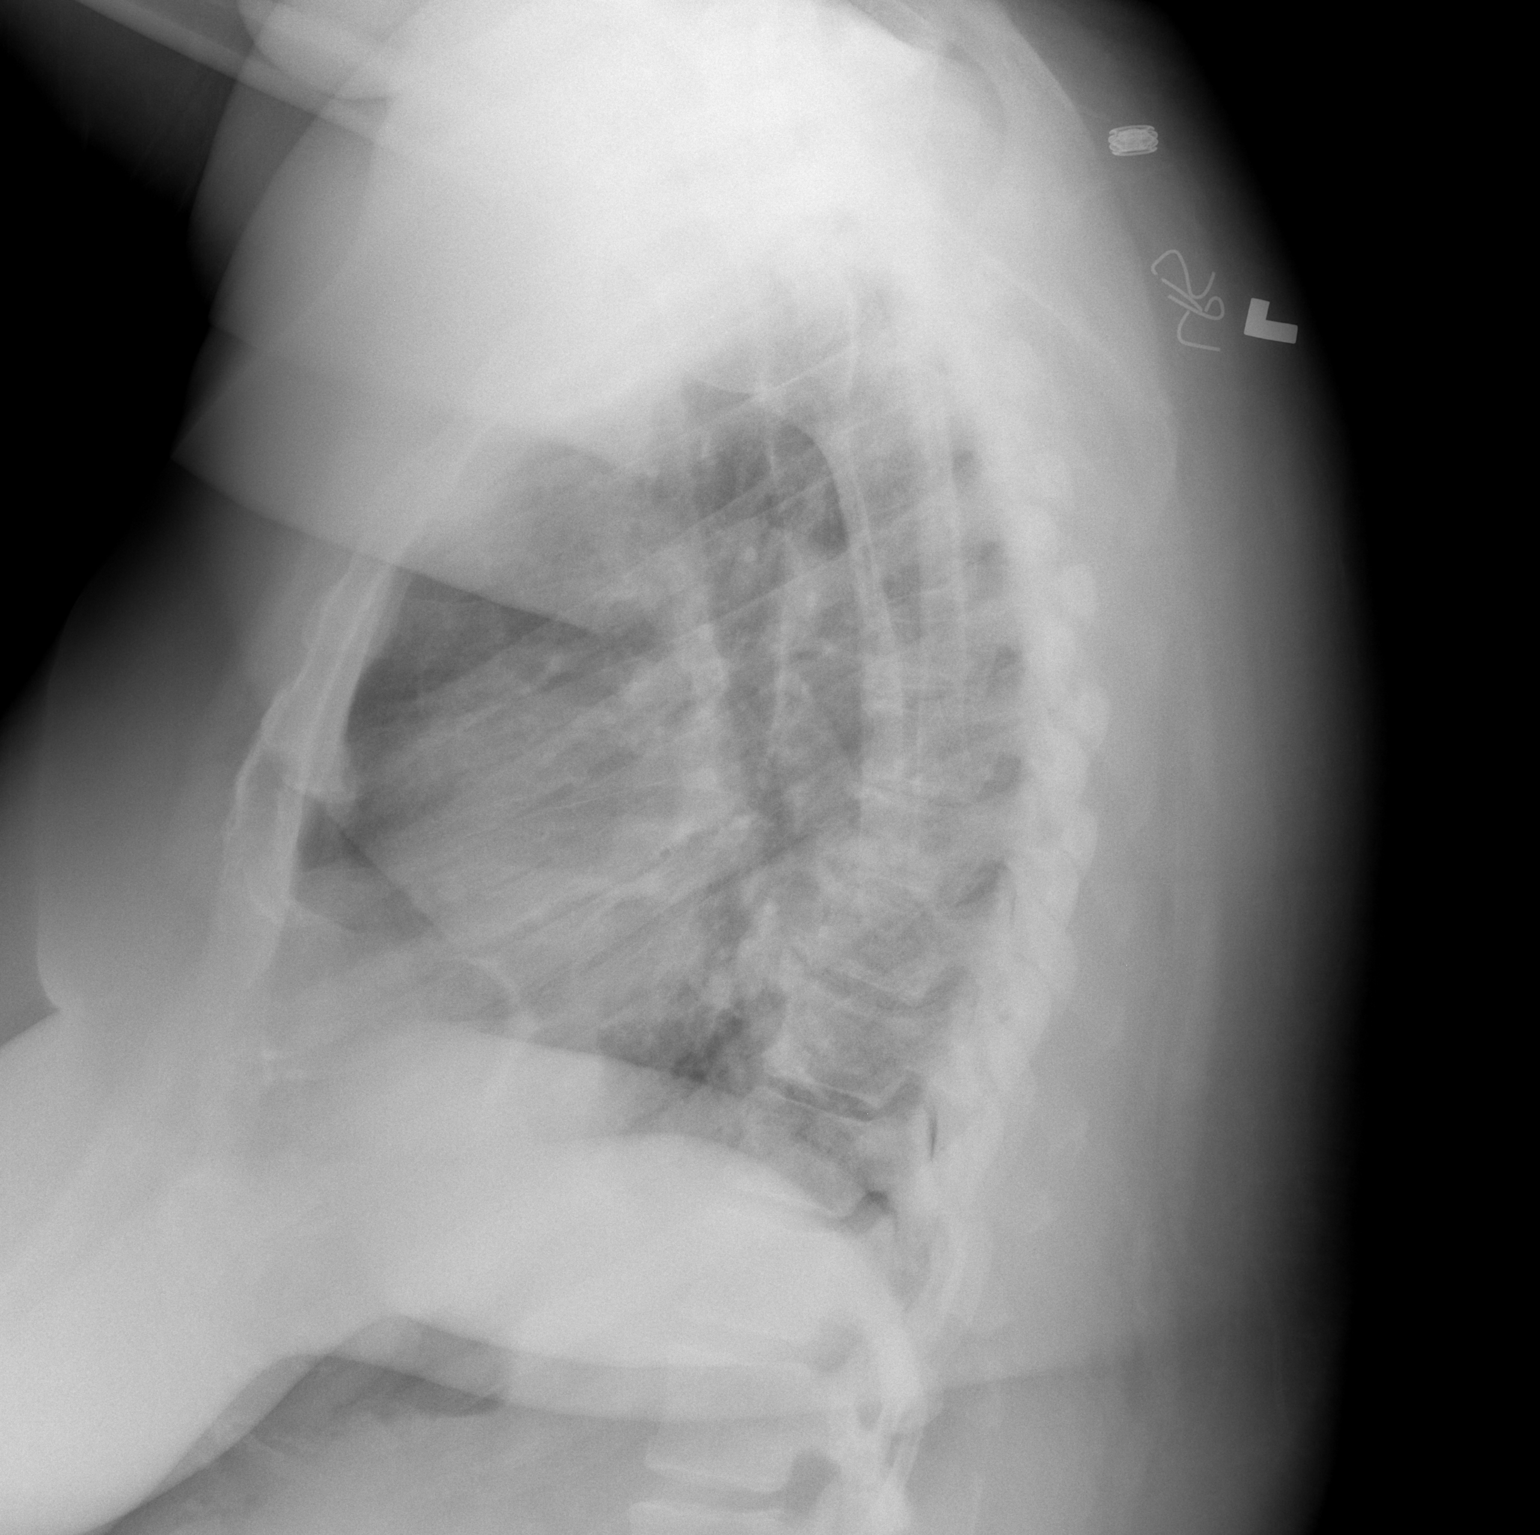

[2 of 2 positions shown; findings below may reference images not displayed]

FINDINGS: Heart size upper normal to mildly enlarged.  Central
vascular congestion and mild interstitial prominence / central
peribronchial thickening.  No pleural effusion or pneumothorax.  No
acute osseous finding.
IMPRESSION: Heart size upper normal with central vascular congestion.  Mild
peribronchial thickening may reflect edema or bronchitic change.

## 2013-12-01 ENCOUNTER — Encounter (HOSPITAL_COMMUNITY): Payer: Self-pay | Admitting: Emergency Medicine

## 2013-12-01 ENCOUNTER — Emergency Department (HOSPITAL_COMMUNITY): Payer: PRIVATE HEALTH INSURANCE

## 2013-12-01 ENCOUNTER — Emergency Department (HOSPITAL_COMMUNITY)
Admission: EM | Admit: 2013-12-01 | Discharge: 2013-12-01 | Disposition: A | Discharge status: Home / Self Care | Payer: PRIVATE HEALTH INSURANCE | Attending: Emergency Medicine | Admitting: Emergency Medicine

## 2013-12-01 DIAGNOSIS — Z9889 Other specified postprocedural states: Secondary | ICD-10-CM | POA: Insufficient documentation

## 2013-12-01 DIAGNOSIS — Z8589 Personal history of malignant neoplasm of other organs and systems: Secondary | ICD-10-CM | POA: Diagnosis not present

## 2013-12-01 DIAGNOSIS — J45909 Unspecified asthma, uncomplicated: Secondary | ICD-10-CM | POA: Insufficient documentation

## 2013-12-01 DIAGNOSIS — F411 Generalized anxiety disorder: Secondary | ICD-10-CM | POA: Diagnosis not present

## 2013-12-01 DIAGNOSIS — E119 Type 2 diabetes mellitus without complications: Secondary | ICD-10-CM | POA: Insufficient documentation

## 2013-12-01 DIAGNOSIS — I509 Heart failure, unspecified: Secondary | ICD-10-CM | POA: Insufficient documentation

## 2013-12-01 DIAGNOSIS — F3289 Other specified depressive episodes: Secondary | ICD-10-CM | POA: Insufficient documentation

## 2013-12-01 DIAGNOSIS — Z87891 Personal history of nicotine dependence: Secondary | ICD-10-CM | POA: Insufficient documentation

## 2013-12-01 DIAGNOSIS — Z880 Allergy status to penicillin: Secondary | ICD-10-CM | POA: Diagnosis not present

## 2013-12-01 DIAGNOSIS — E669 Obesity, unspecified: Secondary | ICD-10-CM | POA: Diagnosis not present

## 2013-12-01 DIAGNOSIS — Z791 Long term (current) use of non-steroidal anti-inflammatories (NSAID): Secondary | ICD-10-CM | POA: Insufficient documentation

## 2013-12-01 DIAGNOSIS — R079 Chest pain, unspecified: Secondary | ICD-10-CM | POA: Diagnosis present

## 2013-12-01 DIAGNOSIS — F329 Major depressive disorder, single episode, unspecified: Secondary | ICD-10-CM | POA: Insufficient documentation

## 2013-12-01 DIAGNOSIS — Z7982 Long term (current) use of aspirin: Secondary | ICD-10-CM | POA: Diagnosis not present

## 2013-12-01 DIAGNOSIS — Z85830 Personal history of malignant neoplasm of bone: Secondary | ICD-10-CM | POA: Diagnosis not present

## 2013-12-01 DIAGNOSIS — Z79899 Other long term (current) drug therapy: Secondary | ICD-10-CM | POA: Insufficient documentation

## 2013-12-01 DIAGNOSIS — R0789 Other chest pain: Secondary | ICD-10-CM | POA: Insufficient documentation

## 2013-12-01 DIAGNOSIS — Z86711 Personal history of pulmonary embolism: Secondary | ICD-10-CM | POA: Diagnosis not present

## 2013-12-01 DIAGNOSIS — IMO0002 Reserved for concepts with insufficient information to code with codable children: Secondary | ICD-10-CM | POA: Diagnosis not present

## 2013-12-01 DIAGNOSIS — Z90710 Acquired absence of both cervix and uterus: Secondary | ICD-10-CM | POA: Insufficient documentation

## 2013-12-01 DIAGNOSIS — I10 Essential (primary) hypertension: Secondary | ICD-10-CM | POA: Insufficient documentation

## 2013-12-01 LAB — CBC
HCT: 37.7 % (ref 36.0–46.0)
Hemoglobin: 12.4 g/dL (ref 12.0–15.0)
MCH: 28.7 pg (ref 26.0–34.0)
MCHC: 32.9 g/dL (ref 30.0–36.0)
MCV: 87.3 fL (ref 78.0–100.0)
Platelets: 242 10*3/uL (ref 150–400)
RBC: 4.32 MIL/uL (ref 3.87–5.11)
RDW: 13.8 % (ref 11.5–15.5)
WBC: 5.7 10*3/uL (ref 4.0–10.5)

## 2013-12-01 LAB — URINALYSIS, ROUTINE W REFLEX MICROSCOPIC
Bilirubin Urine: NEGATIVE
Glucose, UA: NEGATIVE mg/dL
Hgb urine dipstick: NEGATIVE
Ketones, ur: NEGATIVE mg/dL
Leukocytes, UA: NEGATIVE
Nitrite: NEGATIVE
Protein, ur: NEGATIVE mg/dL
Specific Gravity, Urine: 1.021 (ref 1.005–1.030)
Urobilinogen, UA: 1 mg/dL (ref 0.0–1.0)
pH: 6 (ref 5.0–8.0)

## 2013-12-01 LAB — COMPREHENSIVE METABOLIC PANEL
ALT: 31 U/L (ref 0–35)
AST: 20 U/L (ref 0–37)
Albumin: 3.9 g/dL (ref 3.5–5.2)
Alkaline Phosphatase: 57 U/L (ref 39–117)
Anion gap: 14 (ref 5–15)
BUN: 9 mg/dL (ref 6–23)
CO2: 28 mEq/L (ref 19–32)
Calcium: 9.4 mg/dL (ref 8.4–10.5)
Chloride: 103 mEq/L (ref 96–112)
Creatinine, Ser: 0.81 mg/dL (ref 0.50–1.10)
GFR calc Af Amer: >90 mL/min (ref >90)
GFR calc non Af Amer: 81 mL/min — ABNORMAL LOW (ref >90)
Glucose, Bld: 161 mg/dL — ABNORMAL HIGH (ref 70–99)
Potassium: 3.4 mEq/L — ABNORMAL LOW (ref 3.7–5.3)
Sodium: 145 mEq/L (ref 137–147)
Total Bilirubin: 0.4 mg/dL (ref 0.3–1.2)
Total Protein: 7.1 g/dL (ref 6.0–8.3)

## 2013-12-01 LAB — PRO B NATRIURETIC PEPTIDE: Pro B Natriuretic peptide (BNP): 71.8 pg/mL (ref 0–125)

## 2013-12-01 LAB — I-STAT TROPONIN, ED: Troponin i, poc: 0 ng/mL (ref 0.00–0.08)

## 2013-12-01 LAB — D-DIMER, QUANTITATIVE (NOT AT ARMC): D-Dimer, Quant: 0.65 ug/mL-FEU — ABNORMAL HIGH (ref 0.00–0.48)

## 2013-12-01 MED ORDER — OXYCODONE-ACETAMINOPHEN 5-325 MG PO TABS
1.0000 | ORAL_TABLET | Freq: Four times a day (QID) | ORAL | Status: DC | PRN
Start: 2013-12-01 — End: 2014-03-18

## 2013-12-01 MED ORDER — ACETAMINOPHEN 325 MG PO TABS
650.0000 mg | ORAL_TABLET | Freq: Once | ORAL | Status: AC
  Administered 2013-12-01: 650 mg via ORAL
  Filled 2013-12-01: qty 2

## 2013-12-01 MED ORDER — HYDROMORPHONE HCL PF 1 MG/ML IJ SOLN
1.0000 mg | Freq: Once | INTRAMUSCULAR | Status: AC
  Administered 2013-12-01: 1 mg via INTRAVENOUS
  Filled 2013-12-01: qty 1

## 2013-12-01 MED ORDER — ONDANSETRON HCL 4 MG/2ML IJ SOLN
4.0000 mg | Freq: Once | INTRAMUSCULAR | Status: AC
  Administered 2013-12-01: 4 mg via INTRAVENOUS
  Filled 2013-12-01: qty 2

## 2013-12-01 MED ORDER — IOHEXOL 350 MG/ML SOLN
80.0000 mL | Freq: Once | INTRAVENOUS | Status: AC | PRN
  Administered 2013-12-01: 80 mL via INTRAVENOUS

## 2013-12-01 NOTE — ED Notes (Signed)
Mid sternal cp and under breaST PAIN and back pain since Thursday some nausea and sob has hx of chf

## 2013-12-01 NOTE — Discharge Instructions (Signed)
Please read and follow all provided instructions.  Your diagnoses today include:  1. Other chest pain   2. Essential hypertension     Tests performed today include:  An EKG of your heart  A chest x-ray  CT scan of your chest - does not show a blood clot  Cardiac enzymes - a blood test for heart muscle damage  Blood counts and electrolytes  Vital signs. See below for your results today.   Medications prescribed:   Percocet (oxycodone/acetaminophen) - narcotic pain medication  DO NOT drive or perform any activities that require you to be awake and alert because this medicine can make you drowsy. BE VERY CAREFUL not to take multiple medicines containing Tylenol (also called acetaminophen). Doing so can lead to an overdose which can damage your liver and cause liver failure and possibly death.  Take any prescribed medications only as directed.  Follow-up instructions: Please follow-up with your primary care provider as soon as you can for further evaluation of your symptoms.   Return instructions:  SEEK IMMEDIATE MEDICAL ATTENTION IF:  You have severe chest pain, especially if the pain is crushing or pressure-like and spreads to the arms, back, neck, or jaw, or if you have sweating, nausea (feeling sick to your stomach), or shortness of breath. THIS IS AN EMERGENCY. Don't wait to see if the pain will go away. Get medical help at once. Call 911 or [phone] (operator). DO NOT drive yourself to the hospital.   Your chest pain gets worse and does not go away with rest.   You have an attack of chest pain lasting longer than usual, despite rest and treatment with the medications your caregiver has prescribed.   You wake from sleep with chest pain or shortness of breath.  You feel dizzy or faint.  You have chest pain not typical of your usual pain for which you originally saw your caregiver.   You have any other emergent concerns regarding your health.  Additional Information: Chest  pain comes from many different causes. Your caregiver has diagnosed you as having chest pain that is not specific for one problem, but does not require admission.  You are at low risk for an acute heart condition or other serious illness.   Your vital signs today were: BP 165/98   Pulse 82   Temp(Src) 98.6 F (37 C)   Resp 17   Wt 224 lb (101.606 kg)   SpO2 94% If your blood pressure (BP) was elevated above 135/85 this visit, please have this repeated by your doctor within one month. --------------

## 2013-12-01 NOTE — ED Notes (Signed)
PT's SpO2 dropped to 85%. Pt placed on 2L [place] and SpO2 now 94%. Pt drowsy but alert and oriented.  Ashura RN aware.

## 2013-12-01 NOTE — ED Notes (Signed)
Attempted IV x2, unsuccessful, IV team paged 

## 2013-12-01 NOTE — ED Notes (Signed)
VSS. NAD noted. Pt given discharge instructions and prescriptions reviewed. All questions answered. Pt discharged home by wheelchair.

## 2013-12-01 NOTE — ED Notes (Signed)
Notified CT of IV placement.

## 2013-12-01 NOTE — ED Provider Notes (Signed)
CSN: [id number]     Arrival date & time 12/01/13  1321 History   First MD Initiated Contact with Patient 12/01/13 1400     Chief Complaint  Patient presents with  . Chest Pain     (Consider location/radiation/quality/duration/timing/severity/associated sxs/prior Treatment) HPI Comments: History of diastolic heart failure, recurrent chest pain, normal cardiac catheterization in 2008, low risk nuclear stress test in 10/2012, previous PE in 2008 on coumadin for 6 months -- presents with complaint of substernal chest pain for the past 2 days. Until 2 days ago pain was intermittent for 24 hours preceding. Patient has had same pain in the past. She states she feels short of breath with the pain at times. She denies fever or cough. No palpitations, diaphoresis, vomiting. Yesterday the left side of her body her was diffusely painful but she denies any trouble walking or weakness on one side. Patient denies signs of stroke including: facial droop, slurred speech, aphasia, weakness/numbness in extremities, imbalance/trouble walking. She follows with Dr. Johnsie Cancel of cardiology for CP and pulmonary for restrictive lung dx. She is on daily Lasix and baby aspirin. The onset of this condition was acute. The course is constant. Aggravating factors: none. Alleviating factors: none.     The history is provided by the patient and medical records.    Past Medical History  Diagnosis Date  . Hypertension   . Diabetes mellitus   . Asthma   . Anginal pain     a. NL cath in 2008;  b. Myoview 03/2011: dec uptake along mid anterior wall on stress imaging -> ? attenuation vs. ischemia, EF 65%;  c. Echo 04/2011: EF 55-60%, no RWMA, Gr 2 dd  . Depression   . Anxiety   . Pulmonary embolism     a. 2008 -> coumadin x 6 mos.  . Sleep apnea   . 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 Obesity   . CHF (congestive heart failure)   . CHF (congestive heart failure)   . Pulmonary edema   . Cancer   . Bone  cancer    Past Surgical History  Procedure Laterality Date  . Abdominal hysterectomy  2005  . Leg surgery    . Tubal ligation  1989  . Left knee surgery  2008  . Laparoscopic appendectomy N/A 06/03/2012    Procedure: APPENDECTOMY LAPAROSCOPIC;  Surgeon: Stark Klein, MD;  Location: [facility] OR;  Service: General;  Laterality: N/A;  . Cardiac catheterization      Normal   Family History  Problem Relation Age of Onset  . Emphysema Mother   . Arthritis Mother   . Heart failure Mother     alive @ 67  . Asthma Brother   . Heart disease Paternal Grandfather   . Heart disease Father     died @ 24's.  . Stroke Father   . Colon cancer Maternal Grandfather    History  Substance Use Topics  . Smoking status: Former Smoker -- 0.50 packs/day for 15 years    Types: Cigarettes  . Smokeless tobacco: Not on file  . Alcohol Use: No   OB History   Grav Para Term Preterm Abortions TAB SAB Ect Mult Living                 Review of Systems  Constitutional: Negative for fever and diaphoresis.  Eyes: Negative for redness.  Respiratory: Positive for shortness of breath. Negative for cough.   Cardiovascular: Positive  for chest pain. Negative for palpitations and leg swelling.  Gastrointestinal: Negative for nausea, vomiting and abdominal pain.  Genitourinary: Negative for dysuria.  Musculoskeletal: Negative for back pain and neck pain.  Skin: Negative for rash.  Neurological: Negative for syncope and light-headedness.      Allergies  Hydrocodone; Penicillins; and Sulfa antibiotics  Home Medications   Prior to Admission medications   Medication Sig Start Date End Date Taking? Authorizing Provider  ACCU-CHEK FASTCLIX LANCETS MISC As directed 09/05/13   Historical Provider, MD  ACCU-CHEK SMARTVIEW test strip As directed 09/05/13   Historical Provider, MD  acetaminophen-codeine (TYLENOL #3) 300-30 MG per tablet Take 1 tablet by mouth every 4 (four) hours as needed for moderate pain.     Historical Provider, MD  albuterol (PROVENTIL,VENTOLIN) 90 MCG/ACT inhaler Inhale 2 puffs into the lungs every 4 (four) hours as needed for shortness of breath.     Historical Provider, MD  aspirin EC 81 MG tablet Take 81 mg by mouth daily.    Historical Provider, MD  atorvastatin (LIPITOR) 40 MG tablet Take 40 mg by mouth at bedtime.     Historical Provider, MD  beclomethasone (QVAR) 80 MCG/ACT inhaler Inhale 1 puff into the lungs 2 (two) times daily.    Historical Provider, MD  carvedilol (COREG CR) 10 MG 24 hr capsule Take 10 mg by mouth daily.    Historical Provider, MD  Exenatide ER (BYDUREON) 2 MG PEN Inject 2 mg into the skin.    Historical Provider, MD  furosemide (LASIX) 40 MG tablet Take 40 mg by mouth daily.    Ripudeep Krystal Eaton, MD  gabapentin (NEURONTIN) 100 MG capsule Take 100 mg by mouth 3 (three) times daily.    Historical Provider, MD  isosorbide dinitrate (ISORDIL) 20 MG tablet Take 20 mg by mouth 2 (two) times daily.     Historical Provider, MD  KLOR-CON M10 10 MEQ tablet 2 (two) times daily. 09/23/13   Historical Provider, MD  lisinopril (PRINIVIL,ZESTRIL) 20 MG tablet Take 20 mg by mouth daily.     Historical Provider, MD  loperamide (IMODIUM) 2 MG capsule Take 2 mg by mouth as needed for diarrhea or loose stools.    Historical Provider, MD  loratadine (CLARITIN) 10 MG tablet Take 10 mg by mouth daily.    Historical Provider, MD  metFORMIN (GLUCOPHAGE) 1000 MG tablet Take 1,000 mg by mouth 2 (two) times daily with a meal.    Historical Provider, MD  Multiple Vitamins-Minerals (MULTIVITAMIN PO) Take 1 tablet by mouth daily.    Historical Provider, MD  nitroGLYCERIN (NITROSTAT) 0.4 MG SL tablet Place 0.4 mg under the tongue every 5 (five) minutes as needed. For chest pain.    Historical Provider, MD  omeprazole (PRILOSEC) 20 MG capsule Take 20 mg by mouth daily.      Historical Provider, MD  oxyCODONE-acetaminophen (PERCOCET) 5-325 MG per tablet Take 2 tablets by mouth every 4 (four)  hours as needed. 06/25/13   Veryl Speak, MD  potassium chloride (K-DUR) 10 MEQ tablet Take 20 mEq by mouth 2 (two) times daily.    Historical Provider, MD  sertraline (ZOLOFT) 100 MG tablet Take 100 mg by mouth daily.     Historical Provider, MD  triamcinolone cream (KENALOG) 0.1 % Apply 1 application topically 2 (two) times daily.    Historical Provider, MD   BP 120/64  Pulse 87  Temp(Src) 98.6 F (37 C)  Resp 16  Wt 224 lb (101.606 kg)  SpO2 96%  Physical Exam  Nursing note and vitals reviewed. Constitutional: She appears well-developed and well-nourished.  HENT:  Head: Normocephalic and atraumatic.  Mouth/Throat: Mucous membranes are normal. Mucous membranes are not dry.  Eyes: Conjunctivae are normal.  Neck: Trachea normal and normal range of motion. Neck supple. Normal carotid pulses and no JVD present. No muscular tenderness present. Carotid bruit is not present. No tracheal deviation present.  Cardiovascular: Normal rate, regular rhythm, S1 normal, S2 normal, normal heart sounds and intact distal pulses.  Exam reveals no decreased pulses.   No murmur heard. Pulmonary/Chest: Effort normal. No respiratory distress. She has no wheezes. She exhibits no tenderness.  Abdominal: Soft. Normal aorta and bowel sounds are normal. There is no tenderness. There is no rebound and no guarding.  Musculoskeletal: Normal range of motion.  Neurological: She is alert.  Skin: Skin is warm and dry. She is not diaphoretic. No cyanosis. No pallor.  Psychiatric: She has a normal mood and affect.    ED Course  Procedures (including critical care time) Labs Review Labs Reviewed  COMPREHENSIVE METABOLIC PANEL - Abnormal; Notable for the following:    Potassium 3.4 (*)    Glucose, Bld 161 (*)    GFR calc non Af Amer 81 (*)    All other components within normal limits  D-DIMER, QUANTITATIVE - Abnormal; Notable for the following:    D-Dimer, Quant 0.65 (*)    All other components within normal  limits  CBC  URINALYSIS, ROUTINE W REFLEX MICROSCOPIC  PRO B NATRIURETIC PEPTIDE  I-STAT TROPOININ, ED    Imaging Review Dg Chest 2 View  12/01/2013   CLINICAL DATA:  Chest pain and difficulty breathing  EXAM: CHEST  2 VIEW  COMPARISON:  Chest radiograph May 03, 2013 and chest CT June 25, 2013  FINDINGS: There is no edema or consolidation. The heart size and pulmonary vascularity are normal. No adenopathy. No pneumothorax. There is degenerative change in the thoracic spine.  IMPRESSION: No edema or consolidation.   Electronically Signed   By: Lowella Grip M.D.   On: 12/01/2013 13:59     EKG Interpretation None      2:25 PM Patient seen and examined. Work-up reviewed. Added d-dimer and BNP given history.  Patient history discussed with Dr. Thurnell Garbe and EKG reviewed.   Vital signs reviewed and are as follows: BP 120/64  Pulse 87  Temp(Src) 98.6 F (37 C)  Resp 16  Wt 224 lb (101.606 kg)  SpO2 96%  D-dimer elevated. CTA ordered.   CTA negative, pt informed. Symptoms are controlled. Patient is frustrated because she does not know what is causing her pain. I reassured her that this is not likely cardiac and I explained why. We discussed CTA results. Will d/c to home with pain medications and PCP follow-up. She is in agreement.   Patient counseled on use of narcotic pain medications. Counseled not to combine these medications with others containing tylenol. Urged not to drink alcohol, drive, or perform any other activities that requires focus while taking these medications. The patient verbalizes understanding and agrees with the plan.  Patient was counseled to return with severe chest pain, especially if the pain is crushing or pressure-like and spreads to the arms, back, neck, or jaw, or if they have sweating, nausea, or shortness of breath with the pain. They were encouraged to call 911 with these symptoms.   They were also told to return if their chest pain gets worse and  does not go away with  rest, they have an attack of chest pain lasting longer than usual despite rest and treatment with the medications their caregiver has prescribed, if they wake from sleep with chest pain or shortness of breath, if they feel dizzy or faint, if they have chest pain not typical of their usual pain, or if they have any other emergent concerns regarding their health.  The patient verbalized understanding and agreed.    MDM   Final diagnoses:  Other chest pain  Essential hypertension   Patient with history of chest pains, PE. She had normal cath in 2008 and normal nuclear stress test 1 year ago. Feel patient is low risk for ACS given history (poor story for ACS/MI, pain constant for >48 hrs now), negative troponin(s), normal/unchanged EKG. Given h/o PE, dimer ordered, slightly elevated, imaging neg for PE or other pulmonary process. It does not appear her heart failure is decompensated. BNP WNL at baseline and no objective signs of fluid overload. At this point she will monitor symptoms, return with worsening/changing symptoms, follow-up with PCP for further eval and I feel this is appropriate.      Carlisle Cater, PA-C 12/01/13 2032

## 2013-12-04 NOTE — ED Provider Notes (Signed)
Medical screening examination/treatment/procedure(s) were performed by non-physician practitioner and as supervising physician I was immediately available for consultation/collaboration.   EKG Interpretation   Date/Time:  Monday December 01 2013 13:26:06 EDT Ventricular Rate:  87 PR Interval:  182 QRS Duration: 100 QT Interval:  376 QTC Calculation: 452 R Axis:   76 Text Interpretation:  Normal sinus rhythm Nonspecific T wave abnormality  Baseline wander Abnormal ECG When compared with ECG of 06/24/2013 No  significant change was found Confirmed by [facility]  MD, Elody Kleinsasser ([id number])  on 12/01/2013 5:02:53 PM        Francine Graven, DO 12/04/13 1532

## 2013-12-17 ENCOUNTER — Encounter: Payer: PRIVATE HEALTH INSURANCE | Attending: Internal Medicine | Admitting: Dietician

## 2013-12-17 VITALS — Ht 60.0 in | Wt 223.5 lb

## 2013-12-17 DIAGNOSIS — IMO0001 Reserved for inherently not codable concepts without codable children: Secondary | ICD-10-CM | POA: Diagnosis present

## 2013-12-17 DIAGNOSIS — I10 Essential (primary) hypertension: Secondary | ICD-10-CM | POA: Diagnosis not present

## 2013-12-17 DIAGNOSIS — Z713 Dietary counseling and surveillance: Secondary | ICD-10-CM | POA: Insufficient documentation

## 2013-12-17 DIAGNOSIS — E1165 Type 2 diabetes mellitus with hyperglycemia: Principal | ICD-10-CM

## 2013-12-17 NOTE — Patient Instructions (Signed)
Plan:  -Continue to read labels to monitor sodium intake -Daily sodium consumption about 2000 mg. -Continue to limit sweet tea, soda and juice intake to about 3, 8 ounce servings (1 measuring cups worth) a week -Aim for 2-3 Carb Choices per meal (30-45 grams) +/- 1 either way  -Aim for 0-15 grams Carbs per snack if hungry -Consider including protein with meals and snacks -Consider reading food labels for Total Carbohydrate and Fat Grams of foods -Continue to exercise 3 days a week, consider increasing to 5 days a week.

## 2013-12-17 NOTE — Progress Notes (Signed)
  Medical Nutrition Therapy:  Appt start time: 1210 end time:  1242.  Assessment:  Primary concerns today: Diabetes and patient is concerned about blood pressure. Patient has lost 3 lbs since last encounter in May. No longer on bydureon, due to GI upset. On new medication now (doesn't know what it is) and reports good blood sugars. Patient reports that she has decreased soda and sweet tea intake (previously multiple servings daily) and says that she has decreased juice intake, however, 24 hour recall conflicted with that. Per dietary recall, patient has decreased intake of foods high in fat and salt, she is seasoning food with garlic powder and Mrs. Dash, but patient is eating foods like frozen chicken wings regularly.  BS: 110 this morning  MEDICATIONS: See chart  DIETARY INTAKE:  24-hr recall:  B ( AM): post cereal - small portion  Snk ( AM) : fruit or crackers  L (PM): chicken salad and grapes with juice Snk (PM): grapes and belvita bites D (PM): chicken salad, water, baked porkchop, broccoli, instant potatoes Snk (PM): popsicle - 1/2 Beverages: occasional soda, sweet tea, water and juice  Eat out: one time a week  Recent physical activity: [employer] 3 times a week (has car now) - water walking and water aerobics on own.  Estimated energy needs: 1600 calories 180 g carbohydrates 120 g protein 44 g fat  Progress Towards Goal(s):  In progress.   Nutritional Diagnosis:  NB-1.1 Food and nutrition-related knowledge deficit As related to misinformation regarding sugar and salt intake.  As evidenced by patient report.    Intervention:  Nutrition education and counseling.  Plan:  -Continue to read labels to monitor sodium intake -Daily sodium consumption about 2000 mg. -Continue to limit sweet tea, soda and juice intake to about 3, 8 ounce servings (1 measuring cups worth) a week -Aim for 2-3 Carb Choices per meal (30-45 grams) +/- 1 either way  -Aim for 0-15 grams Carbs per snack if  hungry -Consider including protein with meals and snacks -Consider reading food labels for Total Carbohydrate and Fat Grams of foods -Continue to exercise 3 days a week, consider increasing to 5 days a week.   Monitoring/Evaluation:  Dietary intake, exercise, carbohydrate counting and salt intake, and body weight prn.

## 2014-02-03 ENCOUNTER — Other Ambulatory Visit: Payer: Self-pay | Admitting: Neurosurgery

## 2014-02-03 DIAGNOSIS — E236 Other disorders of pituitary gland: Secondary | ICD-10-CM

## 2014-02-20 ENCOUNTER — Ambulatory Visit
Admission: RE | Admit: 2014-02-20 | Discharge: 2014-02-20 | Disposition: A | Discharge status: Home / Self Care | Payer: PRIVATE HEALTH INSURANCE | Source: Ambulatory Visit | Attending: Neurosurgery | Admitting: Neurosurgery

## 2014-02-20 DIAGNOSIS — E236 Other disorders of pituitary gland: Secondary | ICD-10-CM

## 2014-02-20 MED ORDER — GADOBENATE DIMEGLUMINE 529 MG/ML IV SOLN: 10.0000 mL | Freq: Once | INTRAVENOUS | Status: AC | PRN

## 2014-02-22 IMAGING — CR DG CHEST 2V
2 series · 2 of 2 positions shown · non-contrast
Comparison: CT Chest 01/12/2011, chest radiograph 01/08/2012

CLINICAL DATA: Short of breath back pain, hypertension

CHEST - 2 VIEW

[w chest pa *]
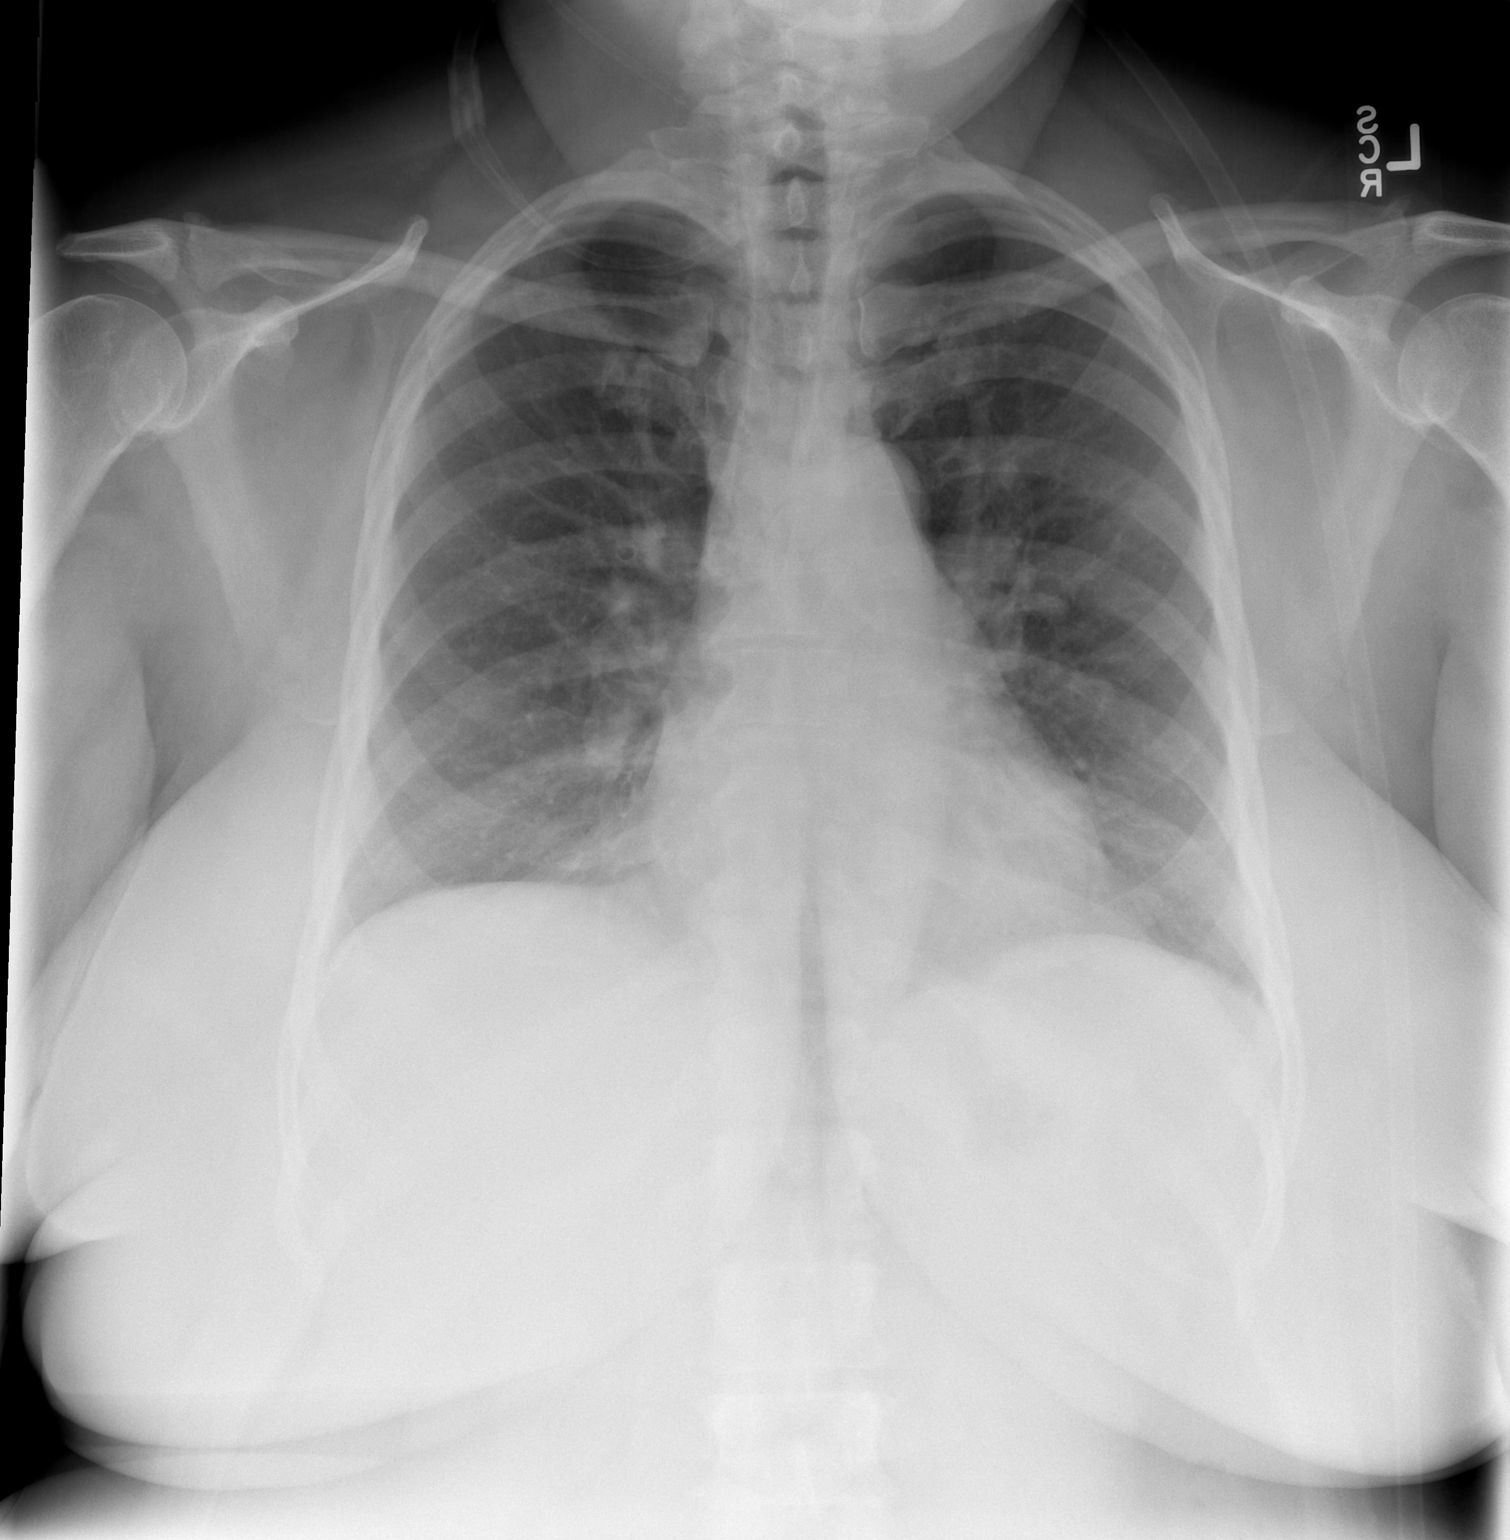

[w chest lat *]
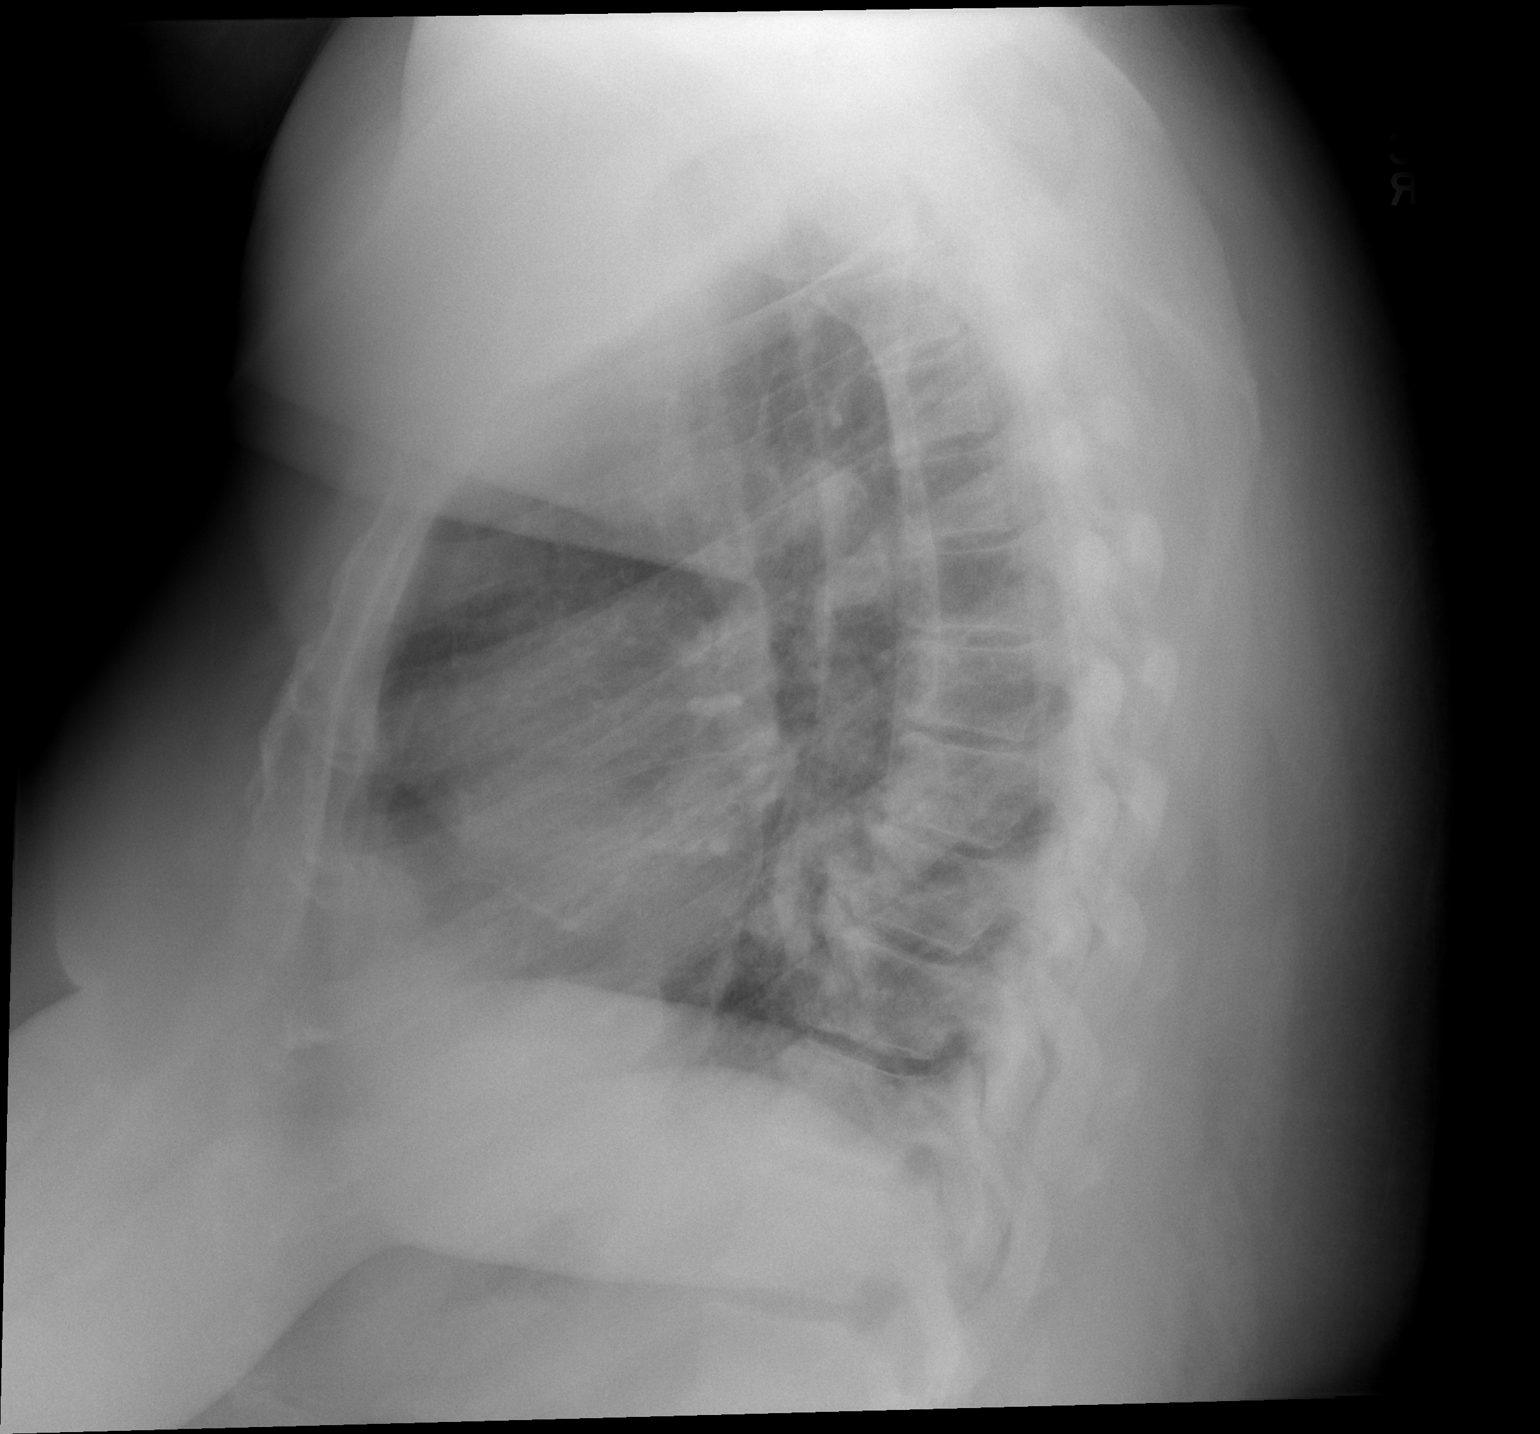

[2 of 2 positions shown; findings below may reference images not displayed]

FINDINGS: Normal cardiac silhouette.  There is coarsened central
bronchovascular markings and interstitial thickening similar to
prior.  No focal consolidation.  There is increased sclerosis
posterior aspect vertebral bodies similar to CT of 4884.
IMPRESSION: . 1.  No acute cardiopulmonary findings.

2.  Chronic bronchitic thickening centrally and interstitial
thickening.

3.  Chronic  increased sclerosis of the posterior elements of the
thoracic bodies.

## 2014-02-24 ENCOUNTER — Emergency Department (HOSPITAL_COMMUNITY): Payer: PRIVATE HEALTH INSURANCE

## 2014-02-24 ENCOUNTER — Emergency Department (HOSPITAL_COMMUNITY)
Admission: EM | Admit: 2014-02-24 | Discharge: 2014-02-24 | Disposition: A | Discharge status: Home / Self Care | Payer: PRIVATE HEALTH INSURANCE | Attending: Emergency Medicine | Admitting: Emergency Medicine

## 2014-02-24 ENCOUNTER — Encounter (HOSPITAL_COMMUNITY): Payer: Self-pay | Admitting: *Deleted

## 2014-02-24 DIAGNOSIS — I509 Heart failure, unspecified: Secondary | ICD-10-CM | POA: Insufficient documentation

## 2014-02-24 DIAGNOSIS — Z9889 Other specified postprocedural states: Secondary | ICD-10-CM | POA: Insufficient documentation

## 2014-02-24 DIAGNOSIS — Z859 Personal history of malignant neoplasm, unspecified: Secondary | ICD-10-CM | POA: Insufficient documentation

## 2014-02-24 DIAGNOSIS — Z8669 Personal history of other diseases of the nervous system and sense organs: Secondary | ICD-10-CM | POA: Diagnosis not present

## 2014-02-24 DIAGNOSIS — E669 Obesity, unspecified: Secondary | ICD-10-CM | POA: Diagnosis not present

## 2014-02-24 DIAGNOSIS — Z79899 Other long term (current) drug therapy: Secondary | ICD-10-CM | POA: Diagnosis not present

## 2014-02-24 DIAGNOSIS — F419 Anxiety disorder, unspecified: Secondary | ICD-10-CM | POA: Insufficient documentation

## 2014-02-24 DIAGNOSIS — F329 Major depressive disorder, single episode, unspecified: Secondary | ICD-10-CM | POA: Diagnosis not present

## 2014-02-24 DIAGNOSIS — M549 Dorsalgia, unspecified: Secondary | ICD-10-CM | POA: Insufficient documentation

## 2014-02-24 DIAGNOSIS — Z7982 Long term (current) use of aspirin: Secondary | ICD-10-CM | POA: Insufficient documentation

## 2014-02-24 DIAGNOSIS — Z8673 Personal history of transient ischemic attack (TIA), and cerebral infarction without residual deficits: Secondary | ICD-10-CM | POA: Insufficient documentation

## 2014-02-24 DIAGNOSIS — Z87891 Personal history of nicotine dependence: Secondary | ICD-10-CM | POA: Insufficient documentation

## 2014-02-24 DIAGNOSIS — R079 Chest pain, unspecified: Secondary | ICD-10-CM | POA: Diagnosis not present

## 2014-02-24 DIAGNOSIS — E119 Type 2 diabetes mellitus without complications: Secondary | ICD-10-CM | POA: Insufficient documentation

## 2014-02-24 DIAGNOSIS — R110 Nausea: Secondary | ICD-10-CM | POA: Diagnosis not present

## 2014-02-24 DIAGNOSIS — Z7951 Long term (current) use of inhaled steroids: Secondary | ICD-10-CM | POA: Diagnosis not present

## 2014-02-24 DIAGNOSIS — R0989 Other specified symptoms and signs involving the circulatory and respiratory systems: Secondary | ICD-10-CM | POA: Diagnosis not present

## 2014-02-24 DIAGNOSIS — Z85830 Personal history of malignant neoplasm of bone: Secondary | ICD-10-CM | POA: Diagnosis not present

## 2014-02-24 DIAGNOSIS — Z880 Allergy status to penicillin: Secondary | ICD-10-CM | POA: Insufficient documentation

## 2014-02-24 DIAGNOSIS — I10 Essential (primary) hypertension: Secondary | ICD-10-CM | POA: Diagnosis not present

## 2014-02-24 DIAGNOSIS — R05 Cough: Secondary | ICD-10-CM | POA: Diagnosis not present

## 2014-02-24 LAB — CBC
HCT: 37.5 % (ref 36.0–46.0)
Hemoglobin: 12.2 g/dL (ref 12.0–15.0)
MCH: 28 pg (ref 26.0–34.0)
MCHC: 32.5 g/dL (ref 30.0–36.0)
MCV: 86.2 fL (ref 78.0–100.0)
Platelets: 210 10*3/uL (ref 150–400)
RBC: 4.35 MIL/uL (ref 3.87–5.11)
RDW: 13.5 % (ref 11.5–15.5)
WBC: 5.9 10*3/uL (ref 4.0–10.5)

## 2014-02-24 LAB — COMPREHENSIVE METABOLIC PANEL
ALT: 35 U/L (ref 0–35)
AST: 22 U/L (ref 0–37)
Albumin: 3.2 g/dL — ABNORMAL LOW (ref 3.5–5.2)
Alkaline Phosphatase: 53 U/L (ref 39–117)
Anion gap: 11 (ref 5–15)
BUN: 11 mg/dL (ref 6–23)
CO2: 28 mEq/L (ref 19–32)
Calcium: 9 mg/dL (ref 8.4–10.5)
Chloride: 104 mEq/L (ref 96–112)
Creatinine, Ser: 0.77 mg/dL (ref 0.50–1.10)
GFR calc Af Amer: >90 mL/min (ref >90)
GFR calc non Af Amer: >90 mL/min (ref >90)
Glucose, Bld: 127 mg/dL — ABNORMAL HIGH (ref 70–99)
Potassium: 3.7 mEq/L (ref 3.7–5.3)
Sodium: 143 mEq/L (ref 137–147)
Total Bilirubin: 0.2 mg/dL — ABNORMAL LOW (ref 0.3–1.2)
Total Protein: 6.5 g/dL (ref 6.0–8.3)

## 2014-02-24 LAB — I-STAT TROPONIN, ED
Troponin i, poc: 0.02 ng/mL (ref 0.00–0.08)
Troponin i, poc: 0.02 ng/mL (ref 0.00–0.08)

## 2014-02-24 LAB — PRO B NATRIURETIC PEPTIDE: Pro B Natriuretic peptide (BNP): 36.4 pg/mL (ref 0–125)

## 2014-02-24 LAB — LIPASE, BLOOD: Lipase: 55 U/L (ref 11–59)

## 2014-02-24 MED ORDER — ONDANSETRON HCL 4 MG/2ML IJ SOLN
4.0000 mg | Freq: Once | INTRAMUSCULAR | Status: AC
  Administered 2014-02-24: 4 mg via INTRAVENOUS
  Filled 2014-02-24: qty 2

## 2014-02-24 MED ORDER — IOHEXOL 350 MG/ML SOLN
100.0000 mL | Freq: Once | INTRAVENOUS | Status: AC | PRN
  Administered 2014-02-24: 100 mL via INTRAVENOUS

## 2014-02-24 MED ORDER — MORPHINE SULFATE 4 MG/ML IJ SOLN
4.0000 mg | Freq: Once | INTRAMUSCULAR | Status: AC
  Administered 2014-02-24: 4 mg via INTRAVENOUS
  Filled 2014-02-24: qty 1

## 2014-02-24 NOTE — ED Notes (Signed)
Indigestion that started yesterday; ~ 1130 this am, it turned into lt. Cp, radiating to back; ems gave 324 mg asa, x 2 ntro, and 4 mg of morphine; piv rt. Fa. 20 ga.

## 2014-02-24 NOTE — ED Provider Notes (Signed)
CSN: [id number]     Arrival date & time 02/24/14  1201 History   First MD Initiated Contact with Patient 02/24/14 1204     Chief Complaint  Patient presents with  . Chest Pain    Patient is a 53 y.o. female presenting with chest pain. The history is provided by the patient.  Chest Pain Pain location:  R chest Pain quality: aching, sharp and shooting   Pain radiates to:  Upper back Pain radiates to the back: yes   Pain severity:  Severe Onset quality:  Gradual Duration:  1 day Timing:  Intermittent Progression:  Waxing and waning Chronicity:  Recurrent Context: breathing, movement (pain worse with moving her right arm and her torso), raising an arm and at rest   Relieved by:  None tried Worsened by:  Nothing tried Ineffective treatments:  None tried Associated symptoms: back pain, cough and nausea   Associated symptoms: no abdominal pain, no altered mental status and no fever   Cough:    Severity:  Moderate   Chronicity:  Recurrent Nausea:    Severity:  Moderate   Onset quality:  Sudden   Duration:  1 day   Timing:  Constant   Progression:  Unchanged Risk factors: diabetes mellitus, hypertension, obesity and prior DVT/PE     Past Medical History  Diagnosis Date  . Hypertension   . Diabetes mellitus   . Asthma   . Anginal pain     a. NL cath in 2008;  b. Myoview 03/2011: dec uptake along mid anterior wall on stress imaging -> ? attenuation vs. ischemia, EF 65%;  c. Echo 04/2011: EF 55-60%, no RWMA, Gr 2 dd  . Depression   . Anxiety   . Pulmonary embolism     a. 2008 -> coumadin x 6 mos.  . Sleep apnea   . 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 Obesity   . CHF (congestive heart failure)   . CHF (congestive heart failure)   . Pulmonary edema   . Cancer   . Bone cancer    Past Surgical History  Procedure Laterality Date  . Abdominal hysterectomy  2005  . Leg surgery    . Tubal ligation  1989  . Left knee surgery  2008  . Laparoscopic  appendectomy N/A 06/03/2012    Procedure: APPENDECTOMY LAPAROSCOPIC;  Surgeon: Stark Klein, MD;  Location: [facility];  Service: General;  Laterality: N/A;  . Cardiac catheterization      Normal   Family History  Problem Relation Age of Onset  . Emphysema Mother   . Arthritis Mother   . Heart failure Mother     alive @ 109  . Asthma Brother   . Heart disease Paternal Grandfather   . Heart disease Father     died @ 96's.  . Stroke Father   . Colon cancer Maternal Grandfather    History  Substance Use Topics  . Smoking status: Former Smoker -- 0.50 packs/day for 15 years    Types: Cigarettes  . Smokeless tobacco: Not on file  . Alcohol Use: No   OB History    No data available     Review of Systems  Constitutional: Negative for fever.  Respiratory: Positive for cough.   Cardiovascular: Positive for chest pain.  Gastrointestinal: Positive for nausea. Negative for abdominal pain.  Musculoskeletal: Positive for back pain.  All other systems reviewed and are negative.   Allergies  Hydrocodone;  Penicillins; and Sulfa antibiotics  Home Medications   Prior to Admission medications   Medication Sig Start Date End Date Taking? Authorizing Provider  ACCU-CHEK FASTCLIX LANCETS MISC As directed 09/05/13   Historical Provider, MD  ACCU-CHEK SMARTVIEW test strip As directed 09/05/13   Historical Provider, MD  acetaminophen-codeine (TYLENOL #3) 300-30 MG per tablet Take 1 tablet by mouth every 4 (four) hours as needed for moderate pain.    Historical Provider, MD  albuterol (PROVENTIL,VENTOLIN) 90 MCG/ACT inhaler Inhale 2 puffs into the lungs every 4 (four) hours as needed for shortness of breath.     Historical Provider, MD  aspirin EC 81 MG tablet Take 81 mg by mouth daily.    Historical Provider, MD  atorvastatin (LIPITOR) 40 MG tablet Take 40 mg by mouth at bedtime.     Historical Provider, MD  beclomethasone (QVAR) 80 MCG/ACT inhaler Inhale 1 puff into the lungs 2 (two) times daily.     Historical Provider, MD  carvedilol (COREG CR) 10 MG 24 hr capsule Take 10 mg by mouth daily.    Historical Provider, MD  Exenatide ER (BYDUREON) 2 MG PEN Inject 2 mg into the skin once a week. On Thursdays    Historical Provider, MD  furosemide (LASIX) 40 MG tablet Take 40 mg by mouth daily.    Ripudeep Krystal Eaton, MD  gabapentin (NEURONTIN) 100 MG capsule Take 100 mg by mouth 3 (three) times daily.    Historical Provider, MD  isosorbide dinitrate (ISORDIL) 20 MG tablet Take 20 mg by mouth 2 (two) times daily.     Historical Provider, MD  KLOR-CON M10 10 MEQ tablet Take 10 mEq by mouth 2 (two) times daily.  09/23/13   Historical Provider, MD  lisinopril (PRINIVIL,ZESTRIL) 20 MG tablet Take 20 mg by mouth daily.     Historical Provider, MD  loperamide (IMODIUM) 2 MG capsule Take 2 mg by mouth as needed for diarrhea or loose stools.    Historical Provider, MD  loratadine (CLARITIN) 10 MG tablet Take 10 mg by mouth daily.    Historical Provider, MD  metFORMIN (GLUCOPHAGE) 1000 MG tablet Take 1,000 mg by mouth 2 (two) times daily with a meal.    Historical Provider, MD  Multiple Vitamins-Minerals (MULTIVITAMIN PO) Take 1 tablet by mouth daily.    Historical Provider, MD  nitroGLYCERIN (NITROSTAT) 0.4 MG SL tablet Place 0.4 mg under the tongue every 5 (five) minutes as needed. For chest pain.    Historical Provider, MD  omeprazole (PRILOSEC) 20 MG capsule Take 20 mg by mouth daily.      Historical Provider, MD  oxyCODONE-acetaminophen (PERCOCET/ROXICET) 5-325 MG per tablet Take 1-2 tablets by mouth every 6 (six) hours as needed for severe pain. 12/01/13   Carlisle Cater, PA-C  sertraline (ZOLOFT) 100 MG tablet Take 100 mg by mouth daily.     Historical Provider, MD   BP 146/82 mmHg  Pulse 76  Temp(Src) 97.7 F (36.5 C) (Oral)  Resp 15  SpO2 96%   Physical Exam  Constitutional: She is oriented to person, place, and time. She appears well-developed and well-nourished. She is cooperative. No distress.   HENT:  Head: Normocephalic and atraumatic.  Right Ear: External ear normal.  Left Ear: External ear normal.  Nose: Nose normal.  Neck: Normal range of motion and phonation normal.  Cardiovascular: Normal rate and regular rhythm.   Pulses:      Dorsalis pedis pulses are 2+ on the right side, and 2+ on the  left side.  Trace peripheral edema bilaterally  Pulmonary/Chest: Effort normal. No respiratory distress. She has no decreased breath sounds. She has no wheezes. She has rales (fine crackles in bases). She exhibits tenderness (right side).  Abdominal: Soft. She exhibits no distension. There is no tenderness. There is no rebound and no guarding.  Neurological: She is alert and oriented to person, place, and time.  Skin: Skin is warm and dry. No rash noted. She is not diaphoretic.  Psychiatric: She has a normal mood and affect.    ED Course  Procedures  Labs Review  Results for orders placed or performed during the hospital encounter of 02/24/14  BNP (order ONLY if patient complains of dyspnea/SOB AND you have documented it for THIS visit)  Result Value Ref Range   Pro B Natriuretic peptide (BNP) 36.4 0 - 125 pg/mL  Comprehensive metabolic panel  Result Value Ref Range   Sodium 143 137 - 147 mEq/L   Potassium 3.7 3.7 - 5.3 mEq/L   Chloride 104 96 - 112 mEq/L   CO2 28 19 - 32 mEq/L   Glucose, Bld 127 (H) 70 - 99 mg/dL   BUN 11 6 - 23 mg/dL   Creatinine, Ser 0.77 0.50 - 1.10 mg/dL   Calcium 9.0 8.4 - 10.5 mg/dL   Total Protein 6.5 6.0 - 8.3 g/dL   Albumin 3.2 (L) 3.5 - 5.2 g/dL   AST 22 0 - 37 U/L   ALT 35 0 - 35 U/L   Alkaline Phosphatase 53 39 - 117 U/L   Total Bilirubin 0.2 (L) 0.3 - 1.2 mg/dL   GFR calc non Af Amer >90 >90 mL/min   GFR calc Af Amer >90 >90 mL/min   Anion gap 11 5 - 15  CBC  Result Value Ref Range   WBC 5.9 4.0 - 10.5 K/uL   RBC 4.35 3.87 - 5.11 MIL/uL   Hemoglobin 12.2 12.0 - 15.0 g/dL   HCT 37.5 36.0 - 46.0 %   MCV 86.2 78.0 - 100.0 fL   MCH 28.0  26.0 - 34.0 pg   MCHC 32.5 30.0 - 36.0 g/dL   RDW 13.5 11.5 - 15.5 %   Platelets 210 150 - 400 K/uL  Lipase, blood  Result Value Ref Range   Lipase 55 11 - 59 U/L  I-stat troponin, ED (not at [facility])  Result Value Ref Range   Troponin i, poc 0.02 0.00 - 0.08 ng/mL   Comment 3          I-stat troponin, ED  Result Value Ref Range   Troponin i, poc 0.02 0.00 - 0.08 ng/mL   Comment 3            Imaging Review Dg Chest 2 View  02/24/2014   CLINICAL DATA:  Chest pain.  Shortness of breath.  EXAM: CHEST  2 VIEW  COMPARISON:  12/01/2013  FINDINGS: No cardiomegaly. Chronic small aortic tortuosity. Fullness of the hila which is consistent with pulmonary artery enlargement on preceding chest CT.  Symmetric indistinct densities at the medial bases which is likely technical - appearance is similar to previous. No pulmonary edema, effusion, or pneumothorax.  IMPRESSION: Stable exam.  No edema or definitive pneumonia.   Electronically Signed   By: Jorje Guild M.D.   On: 02/24/2014 13:25   Ct Angio Chest Pe W/cm &/or Wo Cm  02/24/2014   CLINICAL DATA:  Chest pain. History of CHF, diabetes and pulmonary embolism. Evaluate for PE.  EXAM: CT  ANGIOGRAPHY CHEST WITH CONTRAST  TECHNIQUE: Multidetector CT imaging of the chest was performed using the standard protocol during bolus administration of intravenous contrast. Multiplanar CT image reconstructions and MIPs were obtained to evaluate the vascular anatomy.  CONTRAST:  139mL OMNIPAQUE IOHEXOL 350 MG/ML SOLN  COMPARISON:  Chest CT - 12/01/2013; 06/25/2013 ; chest radiograph -02/24/2014 ; 12/01/2013  FINDINGS: Vascular Findings:  There is adequate opacification of the pulmonary arterial system with the main pulmonary artery measuring 273 Hounsfield units. There are no discrete filling defects within the pulmonary arterial tree to the level of the segmental pulmonary arteries within the right lung and to the level of the subsegmental pulmonary arteries within in  the left lung. Evaluation of the distal vascular tree is degraded secondary to a combination of patient respiratory artifact and quantum mottle artifact from patient's body habitus. Normal caliber pf the main pulmonary artery.  Borderline cardiomegaly.  No pericardial effusion.  Normal caliber of the thoracic aorta. No definite thoracic aortic dissection on this nongated examination. Conventional configuration of the aortic arch. The branch vessels of the aortic arch are widely patent throughout their imaged course.  Review of the MIP images confirms the above findings.   ----------------------------------------------------------------------------------  Nonvascular Findings:  Evaluation of the pulmonary parenchyma is markedly degraded due to patient respiratory artifact. Evaluation is further degraded secondary to quantum mottle artifact from patient body habitus.  There is minimal subsegmental atelectasis within in the right middle lobe in caudal segment of the lingula. There is minimal dependent ground-glass atelectasis within the dependent portion of the bilateral lower lobes, right greater than left. No discrete focal airspace opacities. No discrete air bronchograms. No pleural effusion or pneumothorax. The central pulmonary airways appear widely patent.  No discrete pulmonary nodules given limitations of the examination. No mediastinal, hilar or axillary lymphadenopathy.  Early arterial phase evaluation of the upper abdomen demonstrates diffuse decreased attenuation of the hepatic parenchyma on this postcontrast examination suggestive of hepatic steatosis.  No acute or aggressive osseus abnormalities. 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 Regional soft tissues appear normal. There is asymmetric enlargement of the right lobe of the thyroid which appears heterogeneous in appearance.  IMPRESSION: 1. Degraded examination without definite acute cardiopulmonary disease. Specifically, no evidence  of pulmonary embolism to the level of the segmental pulmonary arteries within the right lung and to the level of the subsegmental pulmonary arteries within the left lung. 2. Borderline cardiomegaly. 3. Hepatic steatosis. 4. Asymmetric enlargement of the right lobe of the thyroid which appears heterogeneous in appearance. Further evaluation with dedicated nonemergent thyroid ultrasound could be performed as clinically indicated.   Electronically Signed   By: Sandi Mariscal M.D.   On: 02/24/2014 15:21     EKG Interpretation   Date/Time:  Tuesday February 24 2014 12:15:44 EST Ventricular Rate:  76 PR Interval:  180 QRS Duration: 97 QT Interval:  395 QTC Calculation: 444 R Axis:   39 Text Interpretation:  Sinus rhythm Anterior infarct, old Sinus rhythm  Non-specific intra-ventricular conduction delay Abnormal ekg Confirmed by  Carmin Muskrat  MD [phone]) on 02/24/2014 12:37:11 PM      EKG #2 (my interpretation) 02/24/14 16:36 NSR, Rate 77, PR 187, QTc 429, Appears unchanged from earlier EKG   MDM   Final diagnoses:  Chest pain    53 y.o. female presenting with right sided atypical chest pain. Worse with movement and palpation. Feels slightly short of breath. Feels similar to PE pain she had in 2008.  Exam as above.   Will obtain CTA of the chest, CXR, labs. Differential includes: PE, CHF, Pneumonia, ACS, Musculoskeletal pain  Delta troponin negative. Repeat EKG unchanged from prior. CXR unchanged from prior. Labs unremarkable. No evidence of CHF exacerbation - BNP not elevated. CT PE study showed no evidence of PE.   No concern for pneumonia, pneumothorax, CHF exacerbation, PE. Atypical for ACS and troponins negative x 2. Doubt ACS.   On re-evaluation the patient is sleeping, appears comfortable, normal respiratory effort. Awakens easily to voice. Reports feeling "better" and only notices the pain when I palpate her chest or when she moves. Lungs clear bilaterally - no wheezing.    Given her very atypical pain (worse with palpation and movement) negative troponin x 2 and unchanged EKGs, will have her follow up as an outpatient. She states that she can call her PCP, Dr. Ardeth Perfect, tomorrow for follow up. I discussed very strict return precautions - these were given in writing as well. She was discharged home.   This case managed in conjunction with my attending, Dr. Vanita Panda.      Berenice Primas, MD 02/24/14 [phone]

## 2014-02-24 NOTE — ED Notes (Signed)
Per pt request her daughter Charlena Cross [phone] was called & a voicemail was left letting her daughter know she is here, provided hospital contact number,

## 2014-02-24 NOTE — Discharge Instructions (Signed)

## 2014-02-24 NOTE — ED Notes (Signed)
Pt undressed, in gown, on monitor, continuous pulse oximetry and blood pressure cuff 

## 2014-02-24 NOTE — ED Notes (Signed)
Fonnie Mu daughter wishes to be informed when mother is discharged. Cell: [phone] work: [phone]

## 2014-02-26 ENCOUNTER — Ambulatory Visit: Payer: PRIVATE HEALTH INSURANCE | Attending: Physical Medicine & Rehabilitation

## 2014-02-26 DIAGNOSIS — Z5189 Encounter for other specified aftercare: Secondary | ICD-10-CM | POA: Insufficient documentation

## 2014-02-26 DIAGNOSIS — R41841 Cognitive communication deficit: Secondary | ICD-10-CM
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
## 2014-03-02 IMAGING — CR DG CHEST 2V
2 series · 2 of 2 positions shown · non-contrast
Comparison: 04/19/2012

CLINICAL DATA: Shortness of breath and cough

CHEST - 2 VIEW

[w chest pa]
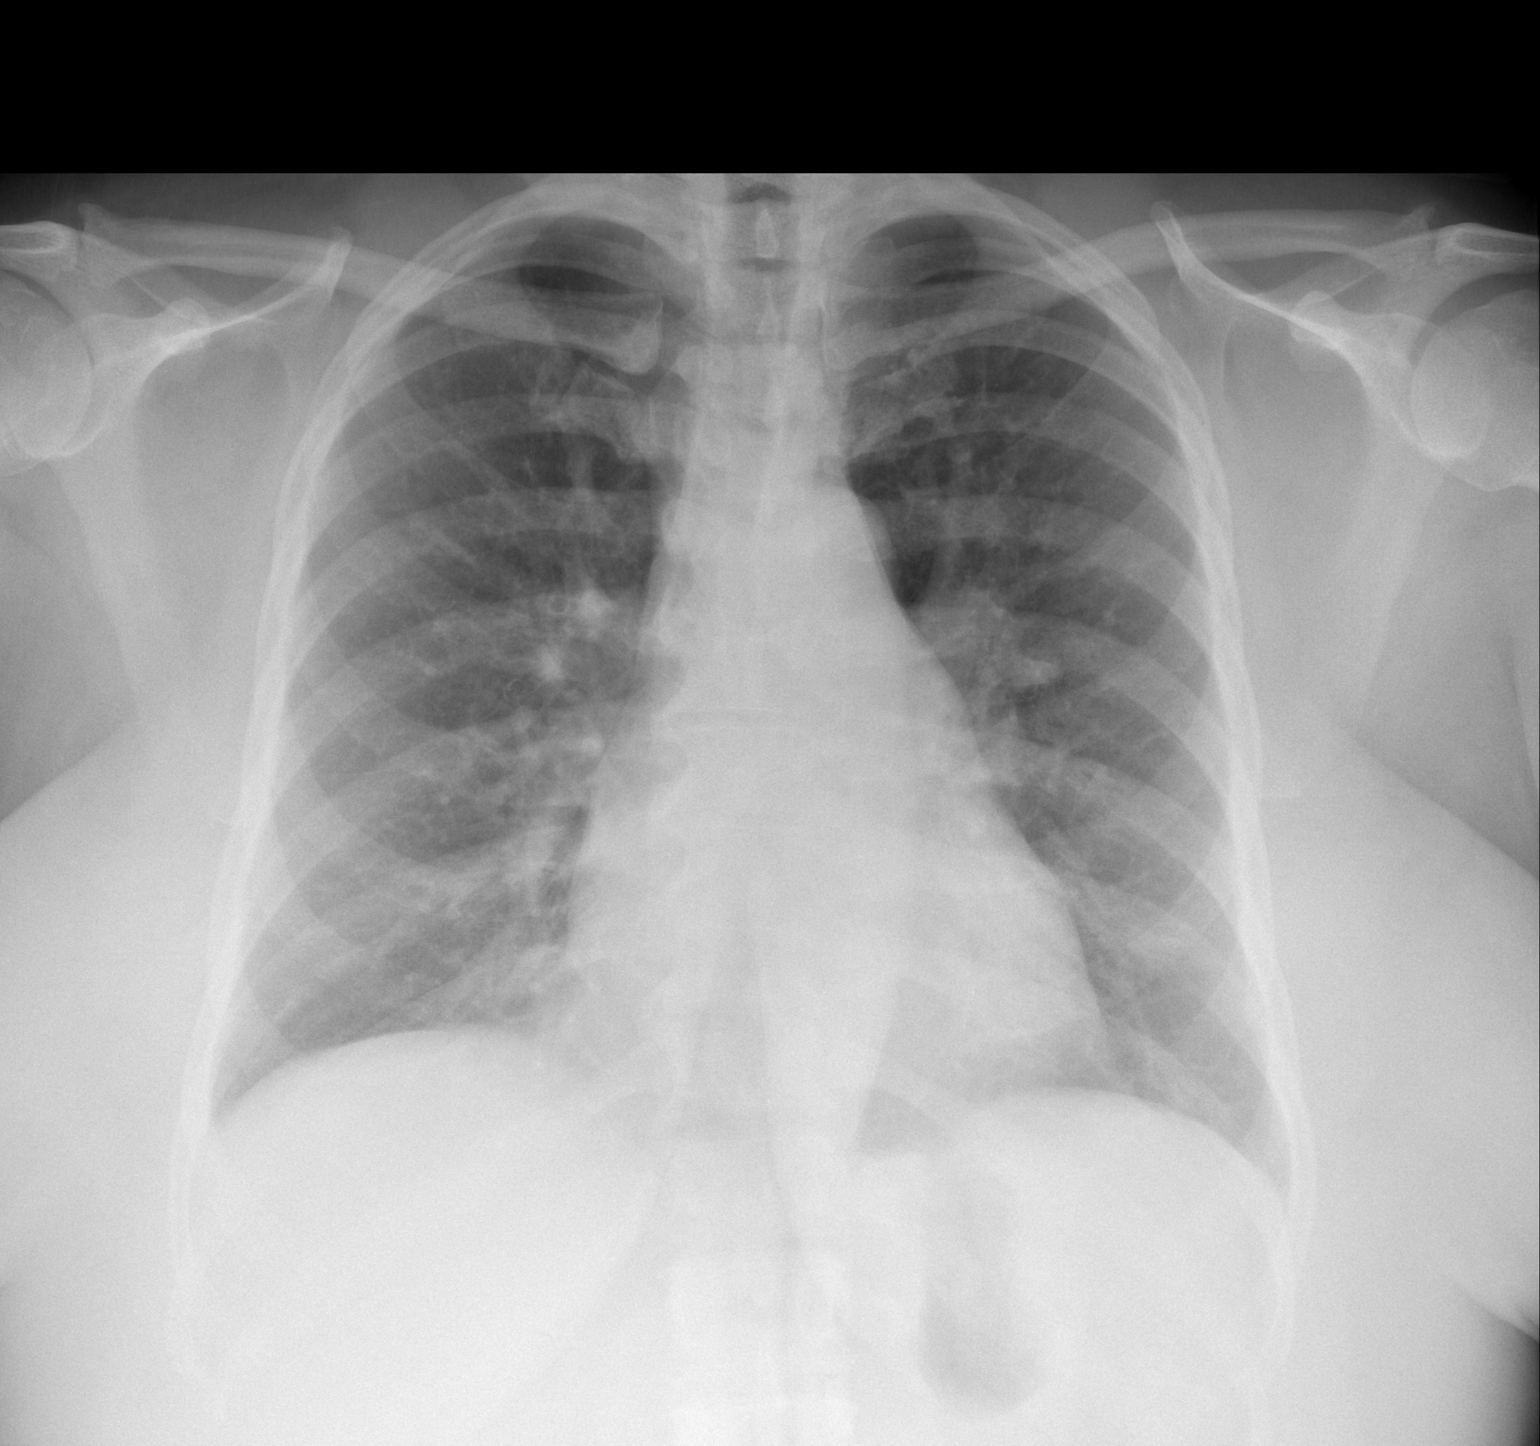

[w chest lat]
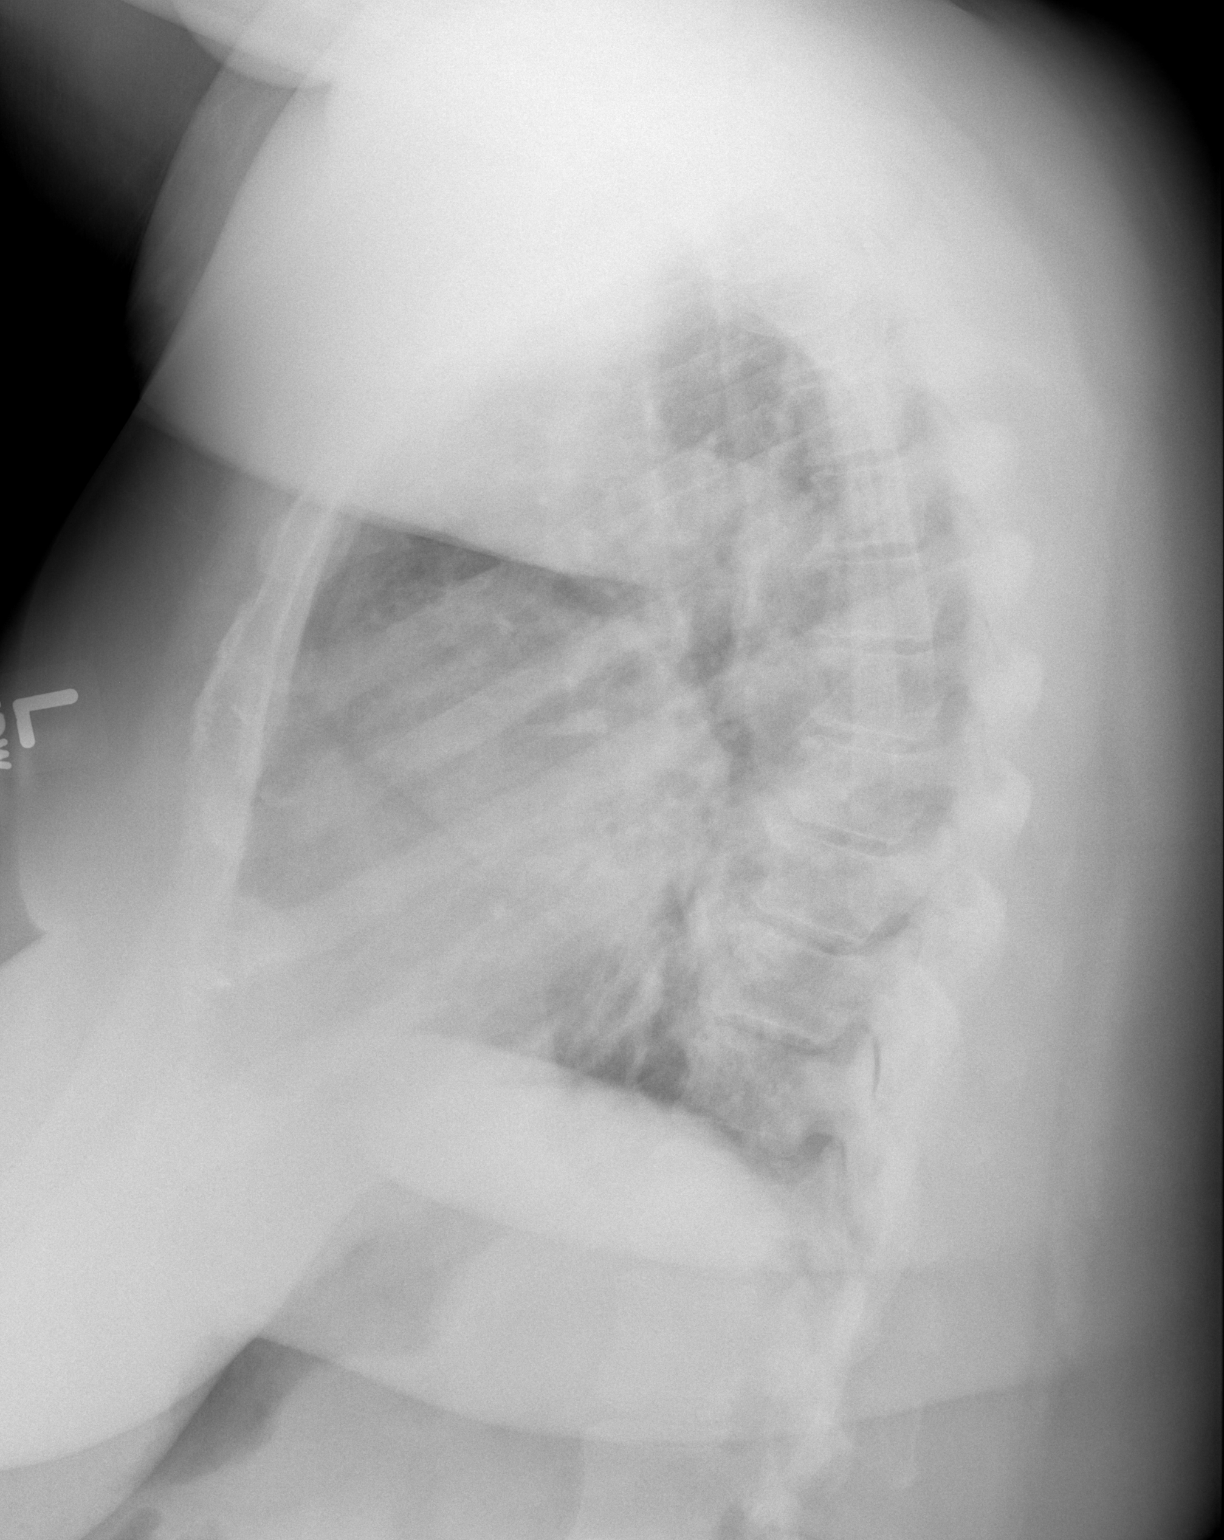

[2 of 2 positions shown; findings below may reference images not displayed]

FINDINGS: Heart size upper limits of normal with central vascular
congestion but no overt edema.  No focal pulmonary opacity.  No
pleural effusion.  No acute osseous abnormality.
IMPRESSION: Borderline cardiomegaly with central vascular congestion but no
focal acute finding.

## 2014-03-06 ENCOUNTER — Other Ambulatory Visit: Payer: Self-pay | Admitting: Neurosurgery

## 2014-03-06 DIAGNOSIS — E236 Other disorders of pituitary gland: Secondary | ICD-10-CM

## 2014-03-09 ENCOUNTER — Ambulatory Visit: Payer: PRIVATE HEALTH INSURANCE

## 2014-03-09 DIAGNOSIS — R41841 Cognitive communication deficit: Secondary | ICD-10-CM

## 2014-03-09 DIAGNOSIS — Z5189 Encounter for other specified aftercare: Secondary | ICD-10-CM | POA: Diagnosis not present

## 2014-03-09 NOTE — Patient Instructions (Signed)
4 ways to help remember:  -W write it down -A  associate -R  repeat it -M  mental picture

## 2014-03-09 NOTE — Therapy (Signed)
Speech Language Pathology Treatment  Patient Details  Name: [name] MRN: [id number] Date of Birth: [birth year]  Encounter Date: 03/09/2014      End of Session - 03/09/14 1101    Visit Number 2   Number of Visits 16   SLP Start Time 1018   SLP Time Calculation (min) 1102   SLP Time Calculation (min) 44 min      Past Medical History  Diagnosis Date  . Hypertension   . Diabetes mellitus   . Asthma   . Anginal pain     a. NL cath in 2008;  b. Myoview 03/2011: dec uptake along mid anterior wall on stress imaging -> ? attenuation vs. ischemia, EF 65%;  c. Echo 04/2011: EF 55-60%, no RWMA, Gr 2 dd  . Depression   . Anxiety   . Pulmonary embolism     a. 2008 -> coumadin x 6 mos.  . Sleep apnea   . 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 Obesity   . CHF (congestive heart failure)   . CHF (congestive heart failure)   . Pulmonary edema   . Cancer   . Bone cancer     Past Surgical History  Procedure Laterality Date  . Abdominal hysterectomy  2005  . Leg surgery    . Tubal ligation  1989  . Left knee surgery  2008  . Laparoscopic appendectomy N/A 06/03/2012    Procedure: APPENDECTOMY LAPAROSCOPIC;  Surgeon: Stark Klein, MD;  Location: [facility];  Service: General;  Laterality: N/A;  . Cardiac catheterization      Normal    There were no vitals taken for this visit.  Visit Diagnosis: Cognitive communication deficit   S: Pt drove herself here.       ADULT SLP TREATMENT - 03/09/14 1042    General Information   Behavior/Cognition Alert;Cooperative;Distractible   Treatment Provided   Treatment provided Cognitive-Linquistic   Cognitive-Linquistic Treatment   Treatment focused on Cognition   Skilled Treatment Montreal Cognitive Assessment (MoCA) completed today with score of 22/30. Given pt's "s" statement about driving herself, SLP recommended pt contact her MD in order to obtain permission to operate motor vehicle. Introduced pt to [employer], and provided examples of each. Pt using mini-notebook as a planner to write down to-do list, appointments, phone numbers. Discussed how to best remember med administration (alarms). Pt recalled 2/4 with extra time; 4/4 with min A . "I thought about writing it down but I thought you might think I was cheating," pt stated.    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 Skilled ST remains necessary to maximize skills for community and possible employment (voc rehab assisting pt with this).   Speech Therapy Frequency 2x / week   Duration --  8 weeks   Treatment/Interventions Environmental controls;Internal/external aids;Compensatory strategies;SLP instruction and feedback;Cognitive reorganization;Patient/family education   Potential to Achieve Goals Good  Potential Considerations Ability to learn/carryover information    homework provided by SLP    Problem List Patient Active Problem List   Diagnosis Date Noted  . Type II or unspecified type diabetes mellitus without mention of complication, uncontrolled 10/29/2012  . S/P laparoscopic appendectomy 06/04/2012  . Acute appendicitis with generalized peritonitis 06/04/2012  . Diastolic CHF 85/46/2703  . Mediastinal mass 01/09/2012  . Hypotension 01/08/2012  . Chest pain 01/08/2012  . DOE (dyspnea on exertion) 06/20/2011  . Mediastinal abnormality 06/20/2011  . Chest pain 05/09/2011  . HYPERCHOLESTEROLEMIA 12/23/2009  .  GLAUCOMA 12/23/2009  . ARTHRITIS 12/23/2009  . CHEST PAIN 12/23/2009    Class: Acute  . 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 HYPERLIPIDEMIA NEC/NOS 09/13/2006  . OBESITY NOS 09/13/2006  . ANXIETY STATE NOS 09/13/2006  . DISORDER, DEPRESSIVE NEC 09/13/2006  . CARPAL TUNNEL SYNDROME, MILD 09/13/2006  . Unspecified essential hypertension 09/13/2006  . IBS 09/13/2006  . DEGENERATION, LUMBAR/LUMBOSACRAL DISC 09/13/2006  . SYMPTOM, SWELLING/MASS/LUMP IN CHEST 09/13/2006  . PULMONARY EMBOLISM, HX OF 09/13/2006                                               [facility], SLP 03/09/2014, 11:03 AM

## 2014-03-12 ENCOUNTER — Ambulatory Visit: Payer: PRIVATE HEALTH INSURANCE

## 2014-03-12 DIAGNOSIS — Z5189 Encounter for other specified aftercare: Secondary | ICD-10-CM | POA: Diagnosis not present

## 2014-03-12 DIAGNOSIS — R41841 Cognitive communication deficit: Secondary | ICD-10-CM

## 2014-03-12 NOTE — Therapy (Signed)
Speech Language Pathology Treatment  Patient Details  Name: [name] MRN: [id number] Date of Birth: 30-Dec-1960  Encounter Date: 03/12/2014      End of Session - 03/12/14 1146    Visit Number 3   Number of Visits 16   Date for SLP Re-Evaluation 04/28/14   SLP Start Time 1108   SLP Time Calculation (min) 1147   SLP Time Calculation (min) 39 min      Past Medical History  Diagnosis Date  . Hypertension   . Diabetes mellitus   . Asthma   . Anginal pain     a. NL cath in 2008;  b. Myoview 03/2011: dec uptake along mid anterior wall on stress imaging -> ? attenuation vs. ischemia, EF 65%;  c. Echo 04/2011: EF 55-60%, no RWMA, Gr 2 dd  . Depression   . Anxiety   . Pulmonary embolism     a. 2008 -> coumadin x 6 mos.  . Sleep apnea   . 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 Obesity   . CHF (congestive heart failure)   . CHF (congestive heart failure)   . Pulmonary edema   . Cancer   . Bone cancer     Past Surgical History  Procedure Laterality Date  . Abdominal hysterectomy  2005  . Leg surgery    . Tubal ligation  1989  . Left knee surgery  2008  . Laparoscopic appendectomy N/A 06/03/2012    Procedure: APPENDECTOMY LAPAROSCOPIC;  Surgeon: Stark Klein, MD;  Location: [facility];  Service: General;  Laterality: N/A;  . Cardiac catheterization      Normal    There were no vitals taken for this visit.  Visit Diagnosis: Cognitive communication deficit    S: "It (writing) works pretty well during [place] studies."      ADULT SLP TREATMENT - 03/12/14 1122    Pain Assessment   Pain Assessment 0-10   Pain Score 7    Pain Location back   Pain Descriptors / Indicators Nagging   Pain Intervention(s) Monitored during session  meds/massage alleviate, moving aggravates   Cognitive-Linquistic Treatment   Treatment focused on Cognition   Skilled Treatment Working memory with compensations completed with occasional min A (repeats), in a task that  SLP provided 5 words and pt had to generate sentence with the words. SLP read pt a 4 sentence paragraph and pt answered questions with compensations with usual min-mod A needed. SLP assessed pt's use of these compensations in real life situations as her performance was lacking in structured therapy tasks. 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 Pt currently doing SLP suggestions of abbreviating book name and writing chapter and verse. Pt recalled random items with usual min A when she did not write adequate cues. With adequate cues pt recalled with rare min A.    Assessment / Recommendations / Plan   Plan Continue with current plan of care   Progression Toward Goals   Progression toward goals Progressing toward goals                Plan - 03/12/14 1146    Clinical Impression Statement pt presents with cognitive communication defict involving memory and attention. Skilled ST remains necessary to maximize skills for community and possible employment (voc rehab assisting pt with this). Pt needs to perform executive function and organization tasks.   Treatment/Interventions Environmental controls;Internal/external aids;Compensatory strategies;SLP instruction and feedback;Cognitive reorganization;Patient/family  education   Potential Considerations Ability to learn/carryover information   Consulted and Agree with Plan of Care Patient        Problem List Patient Active Problem List   Diagnosis Date Noted  . Type II or unspecified type diabetes mellitus without mention of complication, uncontrolled 10/29/2012  . S/P laparoscopic appendectomy 06/04/2012  . Acute appendicitis with generalized peritonitis 06/04/2012  . Diastolic CHF 29/51/8841  . Mediastinal mass 01/09/2012  . Hypotension 01/08/2012  . Chest pain 01/08/2012  . DOE (dyspnea on exertion) 06/20/2011  . Mediastinal abnormality 06/20/2011  . Chest pain 05/09/2011  . HYPERCHOLESTEROLEMIA  12/23/2009  . GLAUCOMA 12/23/2009  . ARTHRITIS 12/23/2009  . CHEST PAIN 12/23/2009    Class: Acute  . 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 HYPERLIPIDEMIA NEC/NOS 09/13/2006  . OBESITY NOS 09/13/2006  . ANXIETY STATE NOS 09/13/2006  . DISORDER, DEPRESSIVE NEC 09/13/2006  . CARPAL TUNNEL SYNDROME, MILD 09/13/2006  . Unspecified essential hypertension 09/13/2006  . IBS 09/13/2006  . DEGENERATION, LUMBAR/LUMBOSACRAL DISC 09/13/2006  . SYMPTOM, SWELLING/MASS/LUMP IN CHEST 09/13/2006  . PULMONARY EMBOLISM, HX OF 09/13/2006                                               [facility], SLP 03/12/2014, 11:49 AM

## 2014-03-16 ENCOUNTER — Ambulatory Visit
Admission: RE | Admit: 2014-03-16 | Discharge: 2014-03-16 | Disposition: A | Discharge status: Home / Self Care | Payer: PRIVATE HEALTH INSURANCE | Source: Ambulatory Visit | Attending: Neurosurgery | Admitting: Neurosurgery

## 2014-03-16 ENCOUNTER — Ambulatory Visit: Payer: PRIVATE HEALTH INSURANCE

## 2014-03-16 DIAGNOSIS — E236 Other disorders of pituitary gland: Secondary | ICD-10-CM

## 2014-03-16 DIAGNOSIS — R41841 Cognitive communication deficit: Secondary | ICD-10-CM

## 2014-03-16 DIAGNOSIS — Z5189 Encounter for other specified aftercare: Secondary | ICD-10-CM | POA: Diagnosis not present

## 2014-03-16 MED ORDER — GADOBENATE DIMEGLUMINE 529 MG/ML IV SOLN
10.0000 mL | Freq: Once | INTRAVENOUS | Status: AC | PRN
  Administered 2014-03-16: 10 mL via INTRAVENOUS

## 2014-03-16 NOTE — Therapy (Signed)
Speech Language Pathology Treatment  Patient Details  Name: [name] MRN: [id number] Date of Birth: 1960-08-08  Encounter Date: 03/16/2014      End of Session - 03/16/14 0856    Visit Number 4   Number of Visits 16   SLP Start Time 0850   SLP Time Calculation (min) 0935   SLP Time Calculation (min) 45 min      Past Medical History  Diagnosis Date  . Hypertension   . Diabetes mellitus   . Asthma   . Anginal pain     a. NL cath in 2008;  b. Myoview 03/2011: dec uptake along mid anterior wall on stress imaging -> ? attenuation vs. ischemia, EF 65%;  c. Echo 04/2011: EF 55-60%, no RWMA, Gr 2 dd  . Depression   . Anxiety   . Pulmonary embolism     a. 2008 -> coumadin x 6 mos.  . Sleep apnea   . 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 Obesity   . CHF (congestive heart failure)   . CHF (congestive heart failure)   . Pulmonary edema   . Cancer   . Bone cancer     Past Surgical History  Procedure Laterality Date  . Abdominal hysterectomy  2005  . Leg surgery    . Tubal ligation  1989  . Left knee surgery  2008  . Laparoscopic appendectomy N/A 06/03/2012    Procedure: APPENDECTOMY LAPAROSCOPIC;  Surgeon: Stark Klein, MD;  Location: [facility];  Service: General;  Laterality: N/A;  . Cardiac catheterization      Normal    There were no vitals taken for this visit.  Visit Diagnosis: Cognitive communication deficit  S: Pt thought her appointment today was 8:30 and arrived 10 minutes late for it. "I have an appointment on Wednesday next door at 8:30 so I got mixed up."   O: Pt worked through [employer] task today, for 25 minutes, with extra time and rare min A back to task due to "getting off track with those noises out there." Pt req'd cues to double check her answers. Task completed with 100% success. Pt knew when her next appointment was due to thinking that was her appointment. Homework provided and SLP encouraged pt to write "complete  with TV at low volume" so she would remember to do this.  Pt returned her homework from previous session to SLP. She did not complete one section and was asked to do so by next visit. Pt recalled with SLP cue that she said she was going to stop by her MDs office to obtain clearance to operate motor vehicle.          Plan - 03/16/14 0856    Clinical Impression Statement pt presents with cognitive communication defict involving memory and attention. Skilled ST remains necessary to maximize skills for community and possible employment (voc rehab assisting pt with this). Pt needs to perform executive function and organization tasks.   Treatment/Interventions Environmental controls;Internal/external aids;Compensatory strategies;SLP instruction and feedback;Cognitive reorganization;Patient/family education   Potential Considerations Ability to learn/carryover information   Consulted and Agree with Plan of Care Patient        Problem List Patient Active Problem List   Diagnosis Date Noted  . Type II or unspecified type diabetes mellitus without mention of complication, uncontrolled 10/29/2012  . S/P laparoscopic appendectomy 06/04/2012  . Acute appendicitis with generalized peritonitis 06/04/2012  . Diastolic CHF 36/64/4034  .  Mediastinal mass 01/09/2012  . Hypotension 01/08/2012  . Chest pain 01/08/2012  . DOE (dyspnea on exertion) 06/20/2011  . Mediastinal abnormality 06/20/2011  . Chest pain 05/09/2011  . HYPERCHOLESTEROLEMIA 12/23/2009  . GLAUCOMA 12/23/2009  . ARTHRITIS 12/23/2009  . CHEST PAIN 12/23/2009    Class: Acute  . 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 HYPERLIPIDEMIA NEC/NOS 09/13/2006  . OBESITY NOS 09/13/2006  . ANXIETY STATE NOS 09/13/2006  . DISORDER, DEPRESSIVE NEC 09/13/2006  . CARPAL TUNNEL SYNDROME, MILD 09/13/2006  . Unspecified essential hypertension 09/13/2006  . IBS 09/13/2006  . DEGENERATION, LUMBAR/LUMBOSACRAL DISC 09/13/2006  . SYMPTOM,  SWELLING/MASS/LUMP IN CHEST 09/13/2006  . PULMONARY EMBOLISM, HX OF 09/13/2006                [name], MS, [place] Phone: [phone] Fax: [phone]  03/16/2014, 9:38 AM

## 2014-03-17 ENCOUNTER — Telehealth: Payer: Self-pay

## 2014-03-17 NOTE — Telephone Encounter (Signed)
Pt cancelled her appt on 03-18-14 due to work schedule.

## 2014-03-18 ENCOUNTER — Encounter: Payer: Self-pay | Admitting: Neurology

## 2014-03-18 ENCOUNTER — Ambulatory Visit (INDEPENDENT_AMBULATORY_CARE_PROVIDER_SITE_OTHER): Payer: Medicare Other | Admitting: Neurology

## 2014-03-18 VITALS — BP 116/76 | HR 76 | Resp 12 | Ht 59.75 in | Wt 222.0 lb

## 2014-03-18 DIAGNOSIS — Z9989 Dependence on other enabling machines and devices: Secondary | ICD-10-CM

## 2014-03-18 DIAGNOSIS — E662 Morbid (severe) obesity with alveolar hypoventilation: Secondary | ICD-10-CM

## 2014-03-18 DIAGNOSIS — I5030 Unspecified diastolic (congestive) heart failure: Secondary | ICD-10-CM

## 2014-03-18 DIAGNOSIS — G4733 Obstructive sleep apnea (adult) (pediatric): Secondary | ICD-10-CM

## 2014-03-18 NOTE — Addendum Note (Signed)
Addended byOliver Hum on: 03/18/2014 10:09 AM   Modules accepted: Medications

## 2014-03-18 NOTE — Progress Notes (Signed)
SLEEP MEDICINE CLINIC   Provider:  Larey Seat, M D  Referring Provider: Velna Hatchet, MD Primary Care Physician:  Velna Hatchet, MD  Chief Complaint  Patient presents with  . NP  OSA Cpap ([place])    Rm 10, alone    HPI:  [name] is a 53 y.o. female  ,seen here as a referral from Dr. Ardeth Perfect for a sleep medicine consultation,  [name], a right-handed 53 year old African-American female is seen here today upon referral by Dr. Ardeth Perfect.  She has been on CPAP for many years,  After being diagnosed in 2011 at [facility] long sleep lab,  but had no medical follow-up regarding her OSA treatment for at least 3 years, according to her. The patient is established with a local pulmonologist and was diagnosed with obstructive sleep apnea as well as obesity hypoventilation syndrome.  She underwent arterial blood gas testing on 10-28-12 and PFTs pulmonary function tests on 08-02-12.  There is a history of congestive heart failure /diastolic failure,  diabetes mellitus II, hypertension and hyperlipidemia.   The patient reports she feels extremely fatigued , and sleeps whenever not stimulated or physically active. First she thought it was due to depression , but she has a lot of physical factors contributing.  She goes to bed at 10.30 PM and has trouble falling asleep, she sleeps through for 3 hours before her first bathroom break,  usually has 2 nocturias.  Her CPAP helps her to sleep sounder, it has not been adjusted. [facility] is her DME. She feels sometimes the CPAP is smothering her and often she took the mask off during sleep and didn't realize it until AM.  She rises at 7 AM to go ready for work.  She has a very dry mouth in AM.  She has breakfast at home before driving to work. Caffee in AM, 12 ounces. She works from 11 to 14.oo hours . At work, she does not have to fight sleepiness as she is active and stimulated, but as soon as she leaves work and sits in her car  she feels very drowsy and has an irresistible urge to sleep.  At home,  she often takes inadvertently naps. She often falls asleep in front of the TV. She may get between 8 and 12 hours of sleep in total in a 24-hour period.  She was told during a hospital based test last month that her oxygen level is low.  I was able to obtain to compliance reports one for 30 and 1 for 90 days today the 90 day download shows that the machine was only used on 26 days and only 6 of those days over 4 hours duration. The set pressure is 17 cm with an EPR level 3 cm water the residual AHI is 2.3 the patient is a very high air leak and all residual apneas are obstructive in nature. I will discuss with her to use a smaller nasal mask and perhaps use a nasal spray to make sure that the airflow through the nose is unrestricted. [name] also has retrograde via and I believe that a full face mask can worsen this condition. I will ask her to return to the sleep lab for an overnight titration and to also see if additional oxygen may be needed at night to treat her fatigue.    SOCIAL ; The patient is divorced, had her first child at age 35 and was married at 52 - 59 years of age.  She is recently part time gainfully employed at [employer], he has a GED- degree and attended college for 2 years without degree, she doesn't smoke, doesn't drink alcohol and has a moderate intake of caffeine , one cup of coffee and up to 2 glasses of iced tea. Rarely a soda. 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 She lived in [place] for several years as her husband was stationed in [place], [place].    Review of Systems: Out of a complete 14 system review, the patient complains of only the following symptoms, and all other reviewed systems are negative. She has suffered from recurrent skin infections related to diabetes. She also has left knee arthritis and right shoulder arthritis and degenerative osteoarthritis. Her  last hospitalization was in 2014 for appendicectomy. The patient's review of systems is positive for shortness of breath, wheezing, recurrent snoring, swelling in her legs, skin itching allergies, aching muscles arthritic joint pain and swelling. Depression and anxiety, headaches numbness and restless legs.  Epworth score 14 , Fatigue severity score 44  ,  Geriatric depression score 4.   History   Social History  . Marital Status: Legally Separated    Spouse Name: N/A    Number of Children: N/A  . Years of Education: N/A   Occupational History  . UNEMPLOYED    Social History Main Topics  . Smoking status: Former Smoker -- 0.50 packs/day for 15 years    Types: Cigarettes    Quit date: 04/24/1985  . Smokeless tobacco: Not on file  . Alcohol Use: No  . Drug Use: No  . Sexual Activity: Not on file   Other Topics Concern  . Not on file   Social History Narrative   Lives in [place] by herself.  Disabled.  Caffeine 1 cup avg daily.  3 kids, 6 grandkids.      Family History  Problem Relation Age of Onset  . Emphysema Mother   . Arthritis Mother   . Heart failure Mother     alive @ 38  . Asthma Brother   . Heart disease Paternal Grandfather   . Heart disease Father     died @ 98's.  . Stroke Father   . Colon cancer Maternal Grandfather     Past Medical History  Diagnosis Date  . Hypertension   . Diabetes mellitus   . Asthma   . Anginal pain     a. NL cath in 2008;  b. Myoview 03/2011: dec uptake along mid anterior wall on stress imaging -> ? attenuation vs. ischemia, EF 65%;  c. Echo 04/2011: EF 55-60%, no RWMA, Gr 2 dd  . Depression   . Anxiety   . Pulmonary embolism     a. 2008 -> coumadin x 6 mos.  . Sleep apnea   . 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 Obesity   . CHF (congestive heart failure)   . CHF (congestive heart failure)   . Pulmonary edema   . Cancer   . Bone cancer     Past Surgical History  Procedure Laterality Date  .  Abdominal hysterectomy  2005  . Leg surgery    . Tubal ligation  1989  . Left knee surgery  2008  . Laparoscopic appendectomy N/A 06/03/2012    Procedure: APPENDECTOMY LAPAROSCOPIC;  Surgeon: Stark Klein, MD;  Location: [facility];  Service: General;  Laterality: N/A;  . Cardiac catheterization  Normal    Current Outpatient Prescriptions  Medication Sig Dispense Refill  . ACCU-CHEK FASTCLIX LANCETS MISC As directed    . ACCU-CHEK SMARTVIEW test strip As directed    . albuterol (PROVENTIL,VENTOLIN) 90 MCG/ACT inhaler Inhale 2 puffs into the lungs every 4 (four) hours as needed for shortness of breath.     [name] aspirin EC 81 MG tablet Take 81 mg by mouth daily.    [name] atorvastatin (LIPITOR) 40 MG tablet Take 40 mg by mouth at bedtime.     . beclomethasone (QVAR) 80 MCG/ACT inhaler Inhale 2 puffs into the lungs 2 (two) times daily.     [name] BENICAR HCT 40-12.5 MG per tablet 1 tablet daily.    . carvedilol (COREG CR) 10 MG 24 hr capsule Take 10 mg by mouth daily.    . Exenatide ER (BYDUREON) 2 MG PEN Inject 2 mg into the skin once a week. On Thursdays    . furosemide (LASIX) 40 MG tablet Take 40 mg by mouth daily.    . isosorbide dinitrate (ISORDIL) 20 MG tablet Take 20 mg by mouth 2 (two) times daily.     [name] KLOR-CON M10 10 MEQ tablet Take 10 mEq by mouth 2 (two) times daily.     [name] loratadine (CLARITIN) 10 MG tablet Take 10 mg by mouth daily.    . metFORMIN (GLUCOPHAGE) 1000 MG tablet Take 1,000 mg by mouth 2 (two) times daily with a meal.    . Multiple Vitamins-Minerals (MULTIVITAMIN PO) Take 1 tablet by mouth daily.    . nitroGLYCERIN (NITROSTAT) 0.4 MG SL tablet Place 0.4 mg under the tongue every 5 (five) minutes as needed. For chest pain.    [name] omeprazole (PRILOSEC) 20 MG capsule Take 20 mg by mouth daily.      . Saxagliptin-Metformin (KOMBIGLYZE XR) 2.08-998 MG TB24 Take 1 tablet by mouth daily.    . sertraline (ZOLOFT) 100 MG tablet Take 100 mg by mouth daily.     [name] tiZANidine (ZANAFLEX) 2 MG  tablet Take 1 tablet by mouth.  0  . acetaminophen-codeine (TYLENOL #3) 300-30 MG per tablet Take 1 tablet by mouth every 4 (four) hours as needed for moderate pain.    [name] gabapentin (NEURONTIN) 100 MG capsule Take 100 mg by mouth 2 (two) times daily.     [name] gabapentin (NEURONTIN) 300 MG capsule Take 300 mg by mouth 2 (two) times daily.  2  . lisinopril (PRINIVIL,ZESTRIL) 20 MG tablet Take 20 mg by mouth daily.     [name] loperamide (IMODIUM) 2 MG capsule Take 2 mg by mouth as needed for diarrhea or loose stools.    [name] oxyCODONE-acetaminophen (PERCOCET/ROXICET) 5-325 MG per tablet Take 1-2 tablets by mouth every 6 (six) hours as needed for severe pain. (Patient not taking: Reported on 03/18/2014) 10 tablet 0  . PROAIR HFA 108 (90 BASE) MCG/ACT inhaler 2 sprays.  5   No current facility-administered medications for this visit.    Allergies as of 03/18/2014 - Review Complete 03/18/2014  Allergen Reaction Noted  . Hydrocodone Other (See Comments) 07/21/2012  . Penicillins Swelling   . Sulfa antibiotics Diarrhea, Itching, and Rash 10/07/2011    Vitals: BP 116/76 mmHg  Pulse 76  Resp 12  Ht 4' 11.75" (1.518 m)  Wt 222 lb (100.699 kg)  BMI 43.70 kg/m2 Last Weight:  Wt Readings from Last 1 Encounters:  03/18/14 222 lb (100.699 kg)       Last Height:   Ht Readings from  Last 1 Encounters:  03/18/14 4' 11.75" (1.518 m)    Physical exam:  General: The patient is awake, alert and appears not in acute distress. The patient is obese.  Head: Normocephalic, atraumatic. Neck is supple. Mallampati 4,  neck circumference: 17. Nasal airflow unrestricted , TMJ is  Not evident. Retrognathia is seen !.  Cardiovascular:  Regular rate and rhythm , without  murmurs or carotid bruit, and without distended neck veins. Respiratory: Lungs are clear to auscultation. Skin:  Without evidence of edema, or rash Trunk: BMI is elevated and patient  has normal posture.  Neurologic exam : The patient is awake and  alert, oriented to place and time.   Memory subjective  described as intact.  There is a normal attention span & concentration ability.  Speech is fluent without dysarthria, dysphonia or aphasia. Mood and affect are depressed . Cranial nerves: Pupils are equal and briskly reactive to light. Funduscopic exam without  evidence of pallor or edema. Extraocular movements  in vertical and horizontal planes intact , but there is coarse saccades noted with left to right gaze, and not a smooth pursuit . Visual fields by finger perimetry are intact. Hearing to finger rub intact.  Facial sensation intact to fine touch. Facial motor strength is symmetric and tongue and uvula move midline.  Motor exam:  Normal tone muscle bulk and symmetric strength in all extremities.  Sensory:  Fine touch, pinprick and vibration were tested in all extremities. Proprioception is normal. Coordination: Rapid alternating movements in the fingers/hands is normal. Finger-to-nose maneuver normal without evidence of ataxia, dysmetria or tremor.  Gait and station: Patient walks without assistive device and is able unassisted to climb up to the exam table. Strength within normal limits.  Stance is stable and normal.Tandem gait is unfragmented. Romberg testing is negative.  Deep tendon reflexes: in the  upper and lower extremities are symmetric and intact. Babinski maneuver is downgoing.   Assessment:  After physical and neurologic examination, review of laboratory studies, imaging, neurophysiology testing and pre-existing records, assessment is  1) morbid obesity and CHF, former smoker with reported hypoxemia. OSA basically untreated  Due to non compliance. I advised the patient to have a SPLIT study and get the new settings applied to her current machine.  I will change her to a nasal mask or nasal pillow  , as not to worsen the retrognathia.     The patient was advised of the nature of the diagnosed sleep disorder , the  treatment options and risks for general a health and wellness arising from not treating the condition. Visit duration was 60  minutes.   Plan:  Treatment plan and additional workup :  1 ) SPLIT at AHI 15 and score at 4 5, CO2 measures needed. I will order this study for January.  2) I change the mask / interface now and ask Jonni Sanger at [facility] to give her an auto setting form 12 to 18 cm water, with 3 cm water EPR.  3) BMI- low carb diet discussed. This will help her diabetes.       Asencion Partridge Soffia Doshier MD  03/18/2014

## 2014-03-18 NOTE — Patient Instructions (Signed)
Sleep Apnea  Sleep apnea is a sleep disorder characterized by abnormal pauses in breathing while you sleep. When your breathing pauses, the level of oxygen in your blood decreases. This causes you to move out of deep sleep and into light sleep. As a result, your quality of sleep is poor, and the system that carries your blood throughout your body (cardiovascular system) experiences stress. If sleep apnea remains untreated, the following conditions can develop:  High blood pressure (hypertension).  Coronary artery disease.  Inability to achieve or maintain an erection (impotence).  Impairment of your thought process (cognitive dysfunction). There are three types of sleep apnea: 1. Obstructive sleep apnea--Pauses in breathing during sleep because of a blocked airway. 2. Central sleep apnea--Pauses in breathing during sleep because the area of the brain that controls your breathing does not send the correct signals to the muscles that control breathing. 3. Mixed sleep apnea--A combination of both obstructive and central sleep apnea. RISK FACTORS The following risk factors can increase your risk of developing sleep apnea:  Being overweight.  Smoking.  Having narrow passages in your nose and throat.  Being of older age.  Being female.  Alcohol use.  Sedative and tranquilizer use.  Ethnicity. Among individuals younger than 35 years, African Americans are at increased risk of sleep apnea. SYMPTOMS   Difficulty staying asleep.  Daytime sleepiness and fatigue.  Loss of energy.  Irritability.  Loud, heavy snoring.  Morning headaches.  Trouble concentrating.  Forgetfulness.  Decreased interest in sex. DIAGNOSIS  In order to diagnose sleep apnea, your caregiver will perform a physical examination. Your caregiver may suggest that you take a home sleep test. Your caregiver may also recommend that you spend the night in a sleep lab. In the sleep lab, several monitors record  information about your heart, lungs, and brain while you sleep. Your leg and arm movements and blood oxygen level are also recorded. TREATMENT The following actions may help to resolve mild sleep apnea:  Sleeping on your side.   Using a decongestant if you have nasal congestion.   Avoiding the use of depressants, including alcohol, sedatives, and narcotics.   Losing weight and modifying your diet if you are overweight. There also are devices and treatments to help open your airway:  Oral appliances. These are custom-made mouthpieces that shift your lower jaw forward and slightly open your bite. This opens your airway.  Devices that create positive airway pressure. This positive pressure "splints" your airway open to help you breathe better during sleep. The following devices create positive airway pressure:  Continuous positive airway pressure (CPAP) device. The CPAP device creates a continuous level of air pressure with an air pump. The air is delivered to your airway through a mask while you sleep. This continuous pressure keeps your airway open.  Nasal expiratory positive airway pressure (EPAP) device. The EPAP device creates positive air pressure as you exhale. The device consists of single-use valves, which are inserted into each nostril and held in place by adhesive. The valves create very little resistance when you inhale but create much more resistance when you exhale. That increased resistance creates the positive airway pressure. This positive pressure while you exhale keeps your airway open, making it easier to breath when you inhale again.  Bilevel positive airway pressure (BPAP) device. The BPAP device is used mainly in patients with central sleep apnea. This device is similar to the CPAP device because it also uses an air pump to deliver continuous air pressure   through a mask. However, with the BPAP machine, the pressure is set at two different levels. The pressure when you  exhale is lower than the pressure when you inhale.  Surgery. Typically, surgery is only done if you cannot comply with less invasive treatments or if the less invasive treatments do not improve your condition. Surgery involves removing excess tissue in your airway to create a wider passage way. Document Released: 03/31/2002 Document Revised: 08/05/2012 Document Reviewed: 08/17/2011 ExitCare Patient Information 2015 [employer]. This information is not intended to replace advice given to you by your health care provider. Make sure you discuss any questions you have with your health care provider.  

## 2014-03-26 ENCOUNTER — Ambulatory Visit: Payer: PRIVATE HEALTH INSURANCE | Attending: Physical Medicine & Rehabilitation

## 2014-03-26 DIAGNOSIS — Z5189 Encounter for other specified aftercare: Secondary | ICD-10-CM | POA: Insufficient documentation

## 2014-03-26 DIAGNOSIS — R41841 Cognitive communication deficit: Secondary | ICD-10-CM | POA: Insufficient documentation
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
## 2014-03-27 ENCOUNTER — Ambulatory Visit: Payer: PRIVATE HEALTH INSURANCE

## 2014-03-27 DIAGNOSIS — Z5189 Encounter for other specified aftercare: Secondary | ICD-10-CM | POA: Diagnosis not present

## 2014-03-27 DIAGNOSIS — R41841 Cognitive communication deficit: Secondary | ICD-10-CM

## 2014-03-27 NOTE — Patient Instructions (Signed)
  Please complete the assigned speech therapy homework and return it to your next session.  

## 2014-03-27 NOTE — Therapy (Signed)
[facility] [address] [place], [place], [address] Phone: [phone]   Fax:  [phone]  Speech Language Pathology Treatment  Patient Details  Name: [name] MRN: [id number] Date of Birth: 05-13-1960  Encounter Date: 03/27/2014      End of Session - 03/27/14 1427    Visit Number 6   Number of Visits 16   Date for SLP Re-Evaluation 04/28/14   SLP Start Time 68   SLP Stop Time  1451   SLP Time Calculation (min) 39 min      Past Medical History  Diagnosis Date  . Hypertension   . Diabetes mellitus   . Asthma   . Anginal pain     a. NL cath in 2008;  b. Myoview 03/2011: dec uptake along mid anterior wall on stress imaging -> ? attenuation vs. ischemia, EF 65%;  c. Echo 04/2011: EF 55-60%, no RWMA, Gr 2 dd  . Depression   . Anxiety   . Pulmonary embolism     a. 2008 -> coumadin x 6 mos.  . Sleep apnea   . 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 Obesity   . CHF (congestive heart failure)   . CHF (congestive heart failure)   . Pulmonary edema   . Cancer   . Bone cancer     Past Surgical History  Procedure Laterality Date  . Abdominal hysterectomy  2005  . Leg surgery    . Tubal ligation  1989  . Left knee surgery  2008  . Laparoscopic appendectomy N/A 06/03/2012    Procedure: APPENDECTOMY LAPAROSCOPIC;  Surgeon: Stark Klein, MD;  Location: [facility];  Service: General;  Laterality: N/A;  . Cardiac catheterization      Normal    There were no vitals taken for this visit.  Visit Diagnosis: Cognitive communication deficit      Subjective Assessment - 03/27/14 1428    Symptoms "Sorry I had another appointment" (pt 12 minutes late)   Currently in Pain? Yes   Pain Score 6    Pain Location Back   Pain Orientation Upper   Pain Descriptors / Indicators Nagging   Pain Type Acute pain   Pain Onset Yesterday   Pain Frequency Intermittent   Aggravating Factors  same position for a long  time   Pain Relieving Factors meds            ADULT SLP TREATMENT - 03/27/14 1431    General Information   Behavior/Cognition Alert;Cooperative;Pleasant mood   Treatment Provided   Treatment provided Cognitive-Linquistic   Cognitive-Linquistic Treatment   Treatment focused on Cognition   Skilled Treatment Pt alternated attention with two written tasks with good success with accuracy on math task as 80%. Tracked when to switch (every 3 minutes) with 90% success. Homework with deductive reasoning tasks and more detailed tasks were completed with 60% success.   Assessment / Recommendations / Plan   Plan Continue with current plan of care  once per week for financial reasons   Progression Toward Goals   Progression toward goals Progressing toward goals            SLP Short Term Goals - 03/26/14 1546    SLP SHORT TERM GOAL #1   Title Pt to use external memory aid for memory compensation twice during a therapy session   Status Achieved   SLP SHORT TERM GOAL #2   Title Pt to complete working memory  tasks, with compensations, at 90% success   Status Achieved   SLP SHORT TERM GOAL #3   Title demo selective attention in simple task for 10 minutes in mod noisy environment with rare cues back to task   Status Achieved          SLP Long Term Goals - 03/27/14 1501    SLP LONG TERM GOAL #1   Title pt will report memory compensations derived during ST sessions as helpful    Status Achieved   SLP LONG TERM GOAL #2   Title pt will complete working memory tasks with compensations at 95% success   Status Achieved   SLP LONG TERM GOAL #3   Title pt will demo alternating attention tasks with 80% success each task with rare min A back to task   Status On-going   SLP LONG TERM GOAL #4   Title pt will demo divided attention skills during therapy tasks with 90% each task    Time 4   Period Weeks   Status New          Plan - 03/27/14 1500    Clinical Impression Statement See  last session for update on goals. SLP unsure if performance on detailed tasks/deductive reasoning homework is due to deficits or is pt's baseline.   Speech Therapy Frequency 1x /week   Duration 4 weeks   Treatment/Interventions Environmental controls;Internal/external aids;Compensatory strategies;SLP instruction and feedback;Cognitive reorganization;Patient/family education   Potential to Achieve Goals Good             Problem List Patient Active Problem List   Diagnosis Date Noted  . Type II or unspecified type diabetes mellitus without mention of complication, uncontrolled 10/29/2012  . S/P laparoscopic appendectomy 06/04/2012  . Acute appendicitis with generalized peritonitis 06/04/2012  . Diastolic CHF 46/80/3212  . Mediastinal mass 01/09/2012  . Hypotension 01/08/2012  . Chest pain 01/08/2012  . DOE (dyspnea on exertion) 06/20/2011  . Mediastinal abnormality 06/20/2011  . Chest pain 05/09/2011  . HYPERCHOLESTEROLEMIA 12/23/2009  . GLAUCOMA 12/23/2009  . ARTHRITIS 12/23/2009  . CHEST PAIN 12/23/2009    Class: Acute  . 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 HYPERLIPIDEMIA NEC/NOS 09/13/2006  . OBESITY NOS 09/13/2006  . ANXIETY STATE NOS 09/13/2006  . DISORDER, DEPRESSIVE NEC 09/13/2006  . CARPAL TUNNEL SYNDROME, MILD 09/13/2006  . Unspecified essential hypertension 09/13/2006  . IBS 09/13/2006  . DEGENERATION, LUMBAR/LUMBOSACRAL DISC 09/13/2006  . SYMPTOM, SWELLING/MASS/LUMP IN CHEST 09/13/2006  . PULMONARY EMBOLISM, HX OF 09/13/2006    [name], MS, [place]  03/27/2014 3:20 PM  Phone: [phone] Fax: [phone]

## 2014-03-31 ENCOUNTER — Encounter: Payer: PRIVATE HEALTH INSURANCE | Admitting: Speech Pathology

## 2014-04-02 ENCOUNTER — Ambulatory Visit: Payer: PRIVATE HEALTH INSURANCE

## 2014-04-02 DIAGNOSIS — R41841 Cognitive communication deficit: Secondary | ICD-10-CM

## 2014-04-02 DIAGNOSIS — Z5189 Encounter for other specified aftercare: Secondary | ICD-10-CM | POA: Diagnosis not present

## 2014-04-02 NOTE — Patient Instructions (Signed)
  Please complete the assigned speech therapy homework and return it to your next session.  
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
## 2014-04-08 IMAGING — CT CT ABD-PELV W/ CM
2 of 5 series · 14 of 32 positions shown, 19 images · IV contrast (omnipaque)
Comparison: 09/20/2008

***ADDENDUM*** CREATED: 06/03/2012 [DATE]

The original report was by Dr. Dr. Ole Richard showed grade.  The
following addendum is by Dr. Jeca Maca Ferizovic:
Emergency findings were called to Dr. Veddeng by the CT
technologist at approximately [DATE] p.m. on 06/03/2012, and the
patient was sent to the emergency department.
CLINICAL DATA: Abdominal pain.  Right-sided cramping.
CT ABDOMEN AND PELVIS WITH CONTRAST
TECHNIQUE: Multidetector CT imaging of the abdomen and pelvis was
performed following the standard protocol during bolus
administration of intravenous contrast.
Contrast: 100mL OMNIPAQUE IOHEXOL 300 MG/ML  SOLN

[Series 2: routine abdomen · axial · 0.70mm/px · z∈[-368,-68]mm · 6 of 85 slices shown, 11 images]
[im 13/85  soft-tissue]
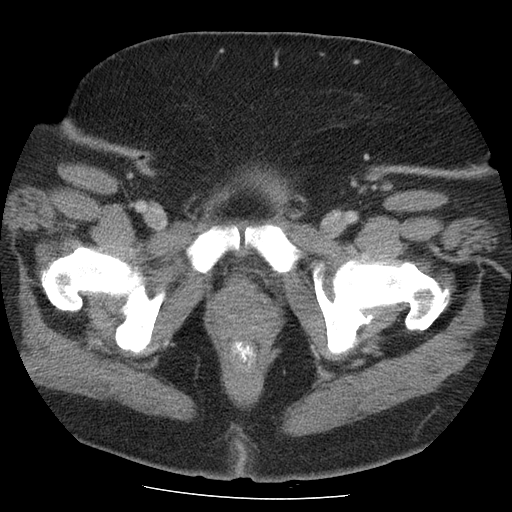
[im 13/85  bone]
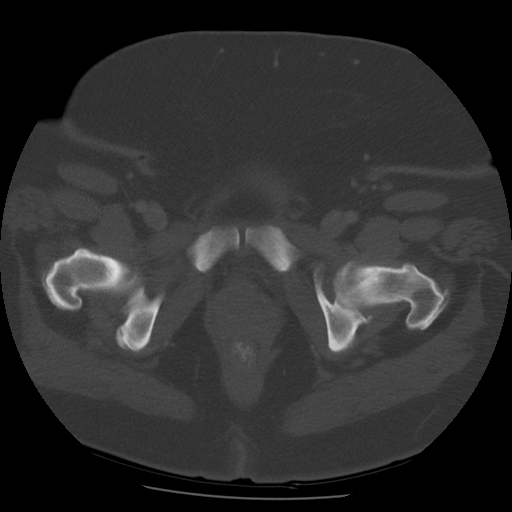
[im 25/85  soft-tissue]
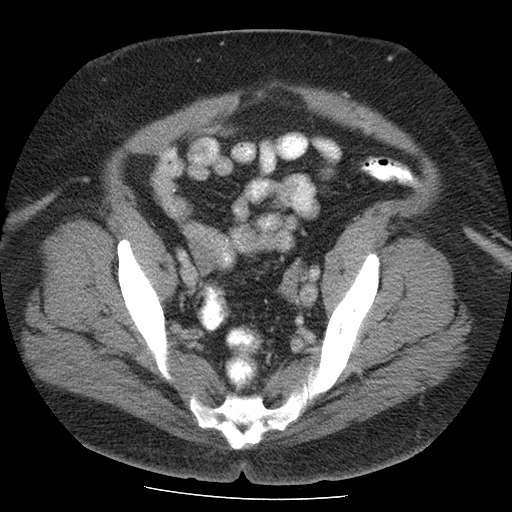
[im 37/85  soft-tissue]
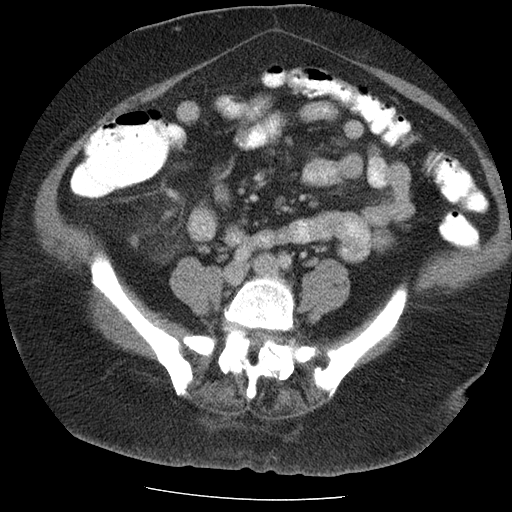
[im 37/85  lung]
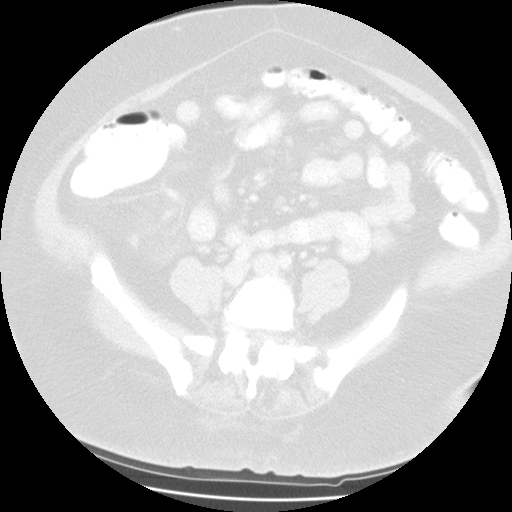
[im 49/85  soft-tissue]
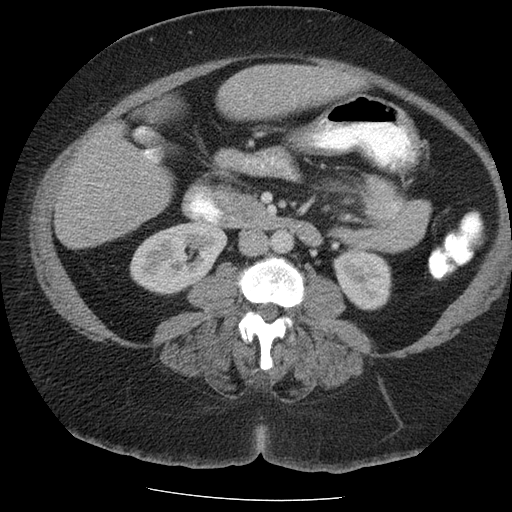
[im 49/85  lung]
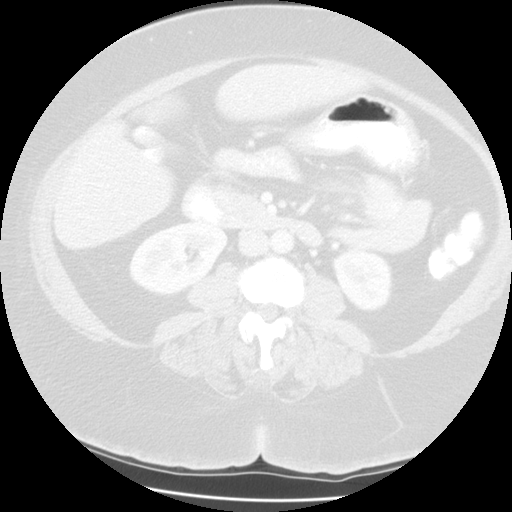
[im 61/85  soft-tissue]
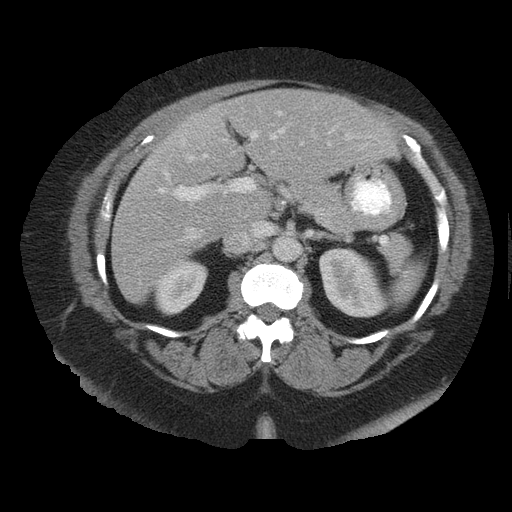
[im 61/85  lung]
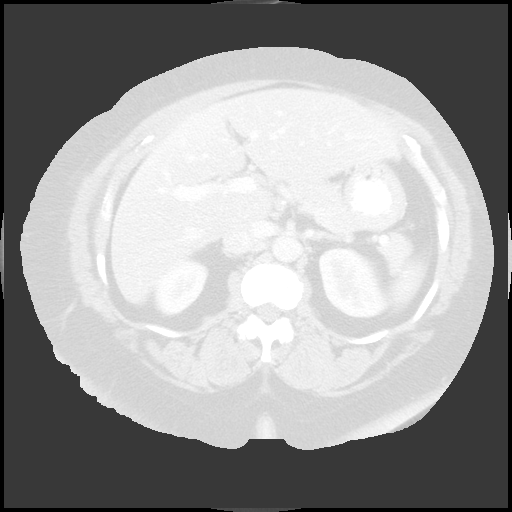
[im 73/85  soft-tissue]
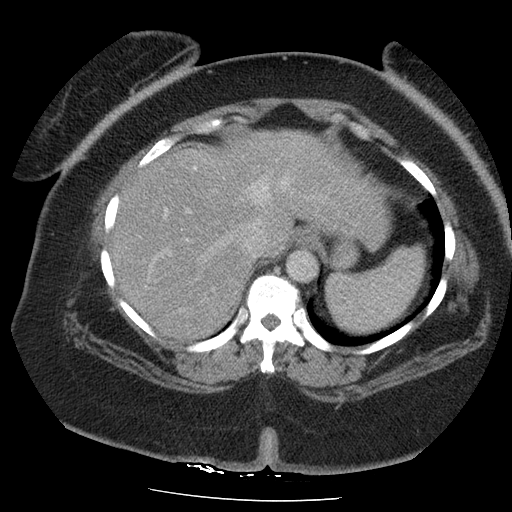
[im 73/85  lung]
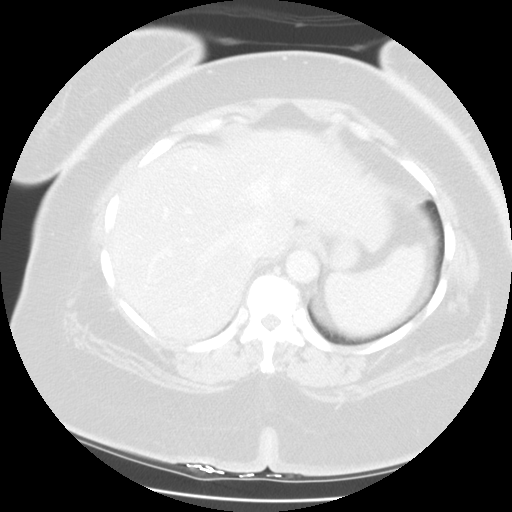

[Series 401: sag · sagittal · 0.84mm/px · 8 of 120 slices shown]
[im 14/120  soft-tissue]
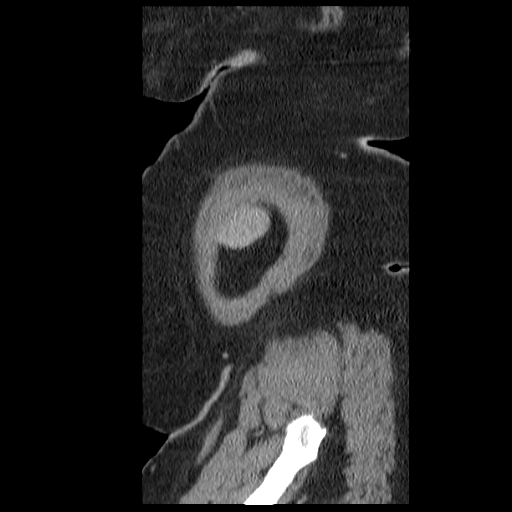
[im 27/120  soft-tissue]
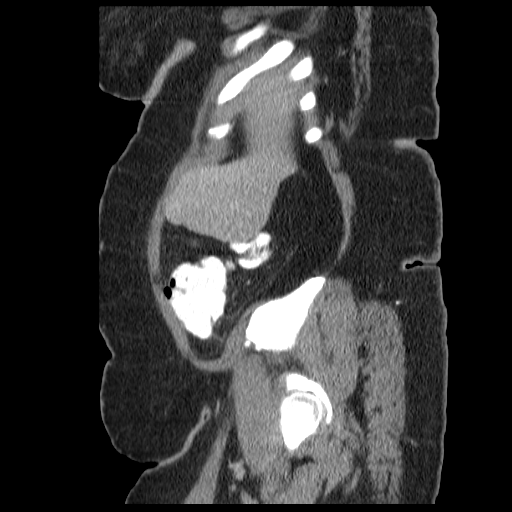
[im 40/120  soft-tissue]
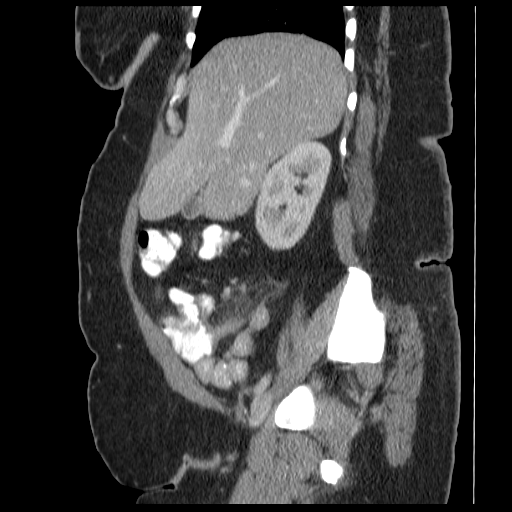
[im 53/120  soft-tissue]
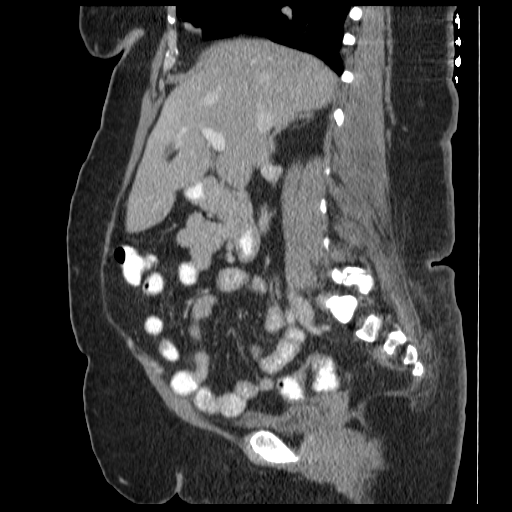
[im 67/120  soft-tissue]
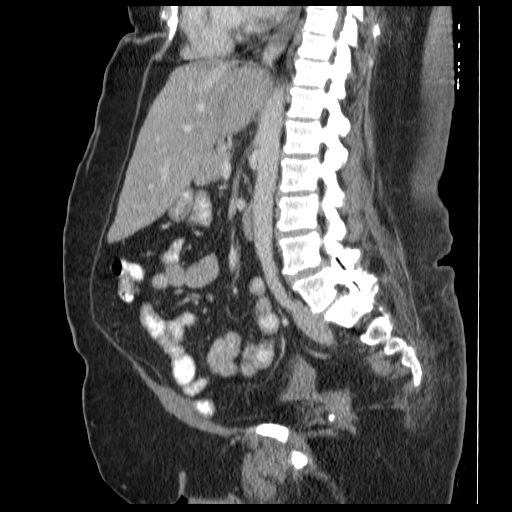
[im 80/120  soft-tissue]
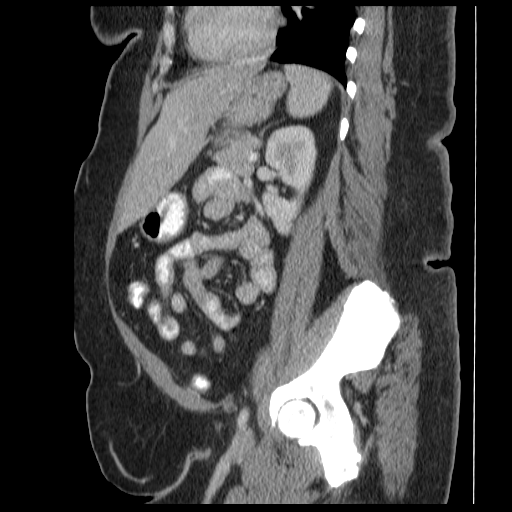
[im 93/120  soft-tissue]
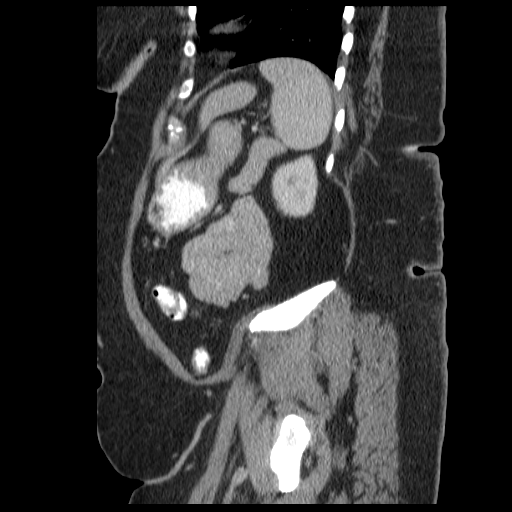
[im 106/120  soft-tissue]
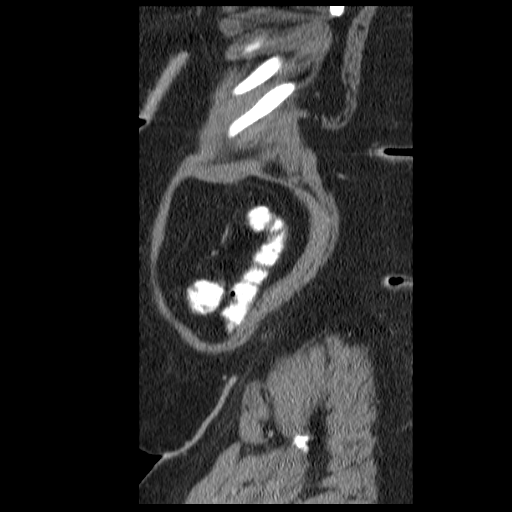

[14 of 32 positions shown; findings below may reference images not displayed]

FINDINGS: Lung bases are clear.  No pleural or pericardial fluid.
The liver shows fatty change without focal lesions or biliary
ductal dilatation.  No calcified gallstones.  The spleen is normal.
The pancreas is normal.  The adrenal glands are normal.  The
kidneys are normal.  No cyst, mass, stone or hydronephrosis.  The
aorta and IVC are normal.  There is acute appendicitis with a
thickened appendix and periappendiceal stranding but no rupture or
abscess.  The appendix is largely retrocecal.  There has been
previous hysterectomy.  No pelvic mass.  Ordinary degenerative
changes effect the lower lumbar spine.
IMPRESSION: Acute appendicitis without rupture or abscess.

## 2014-04-08 IMAGING — CR DG CHEST 1V PORT
1 series · 1 of 1 positions shown · non-contrast
Comparison: 04/27/2012

CLINICAL DATA: Chest and abdominal pain.

PORTABLE CHEST - 1 VIEW

[AP]
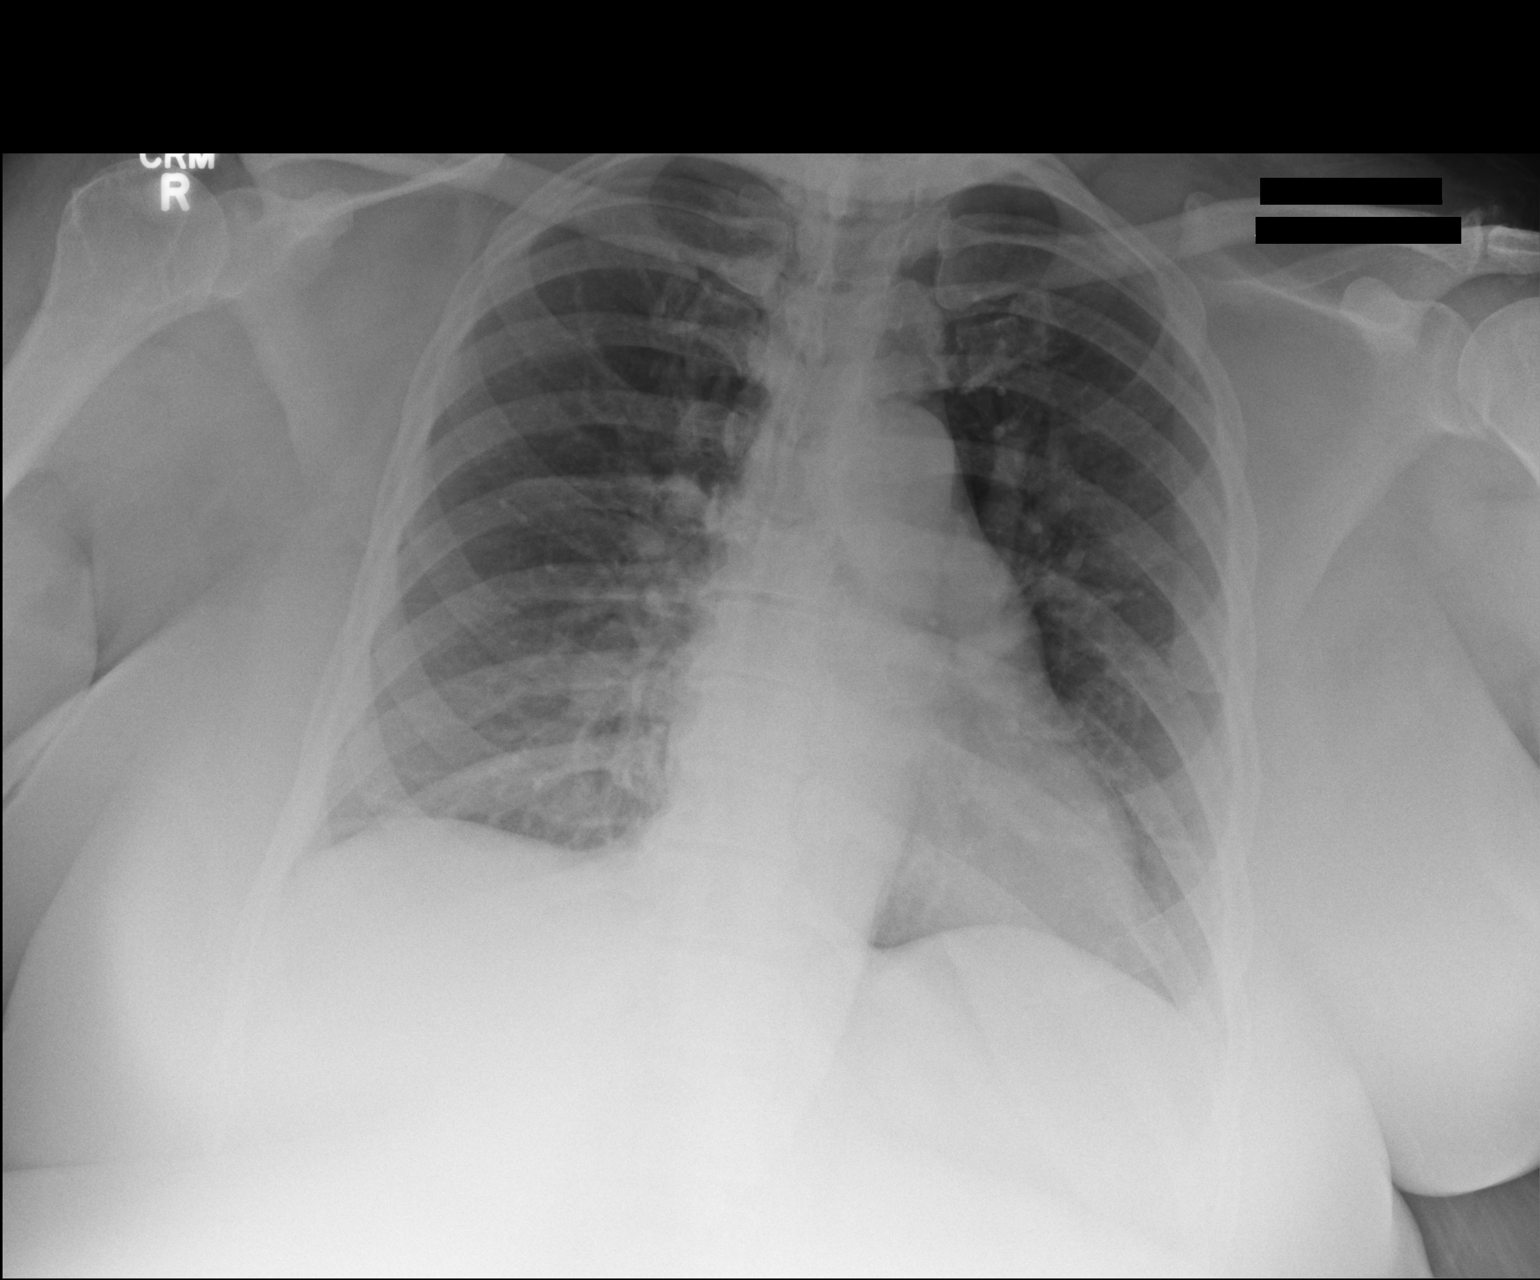

[1 of 1 positions shown; findings below may reference images not displayed]

FINDINGS: Cardiac and mediastinal contours appear normal.

The lungs appear clear.

No pleural effusion is identified.  Thoracic spondylosis noted.
IMPRESSION: 1.  No acute thoracic findings are apparent.

2.  Thoracic spondylosis.

## 2014-04-09 ENCOUNTER — Ambulatory Visit: Payer: PRIVATE HEALTH INSURANCE

## 2014-04-09 DIAGNOSIS — R41841 Cognitive communication deficit: Secondary | ICD-10-CM

## 2014-04-09 DIAGNOSIS — Z5189 Encounter for other specified aftercare: Secondary | ICD-10-CM | POA: Diagnosis not present

## 2014-04-09 IMAGING — CR DG CHEST 1V PORT
1 series · 1 of 1 positions shown · non-contrast
Comparison: Chest radiograph 06/03/2012and CT chest 04/27/2012

CLINICAL DATA: Chest pain and dyspnea

PORTABLE CHEST - 1 VIEW

[AP]
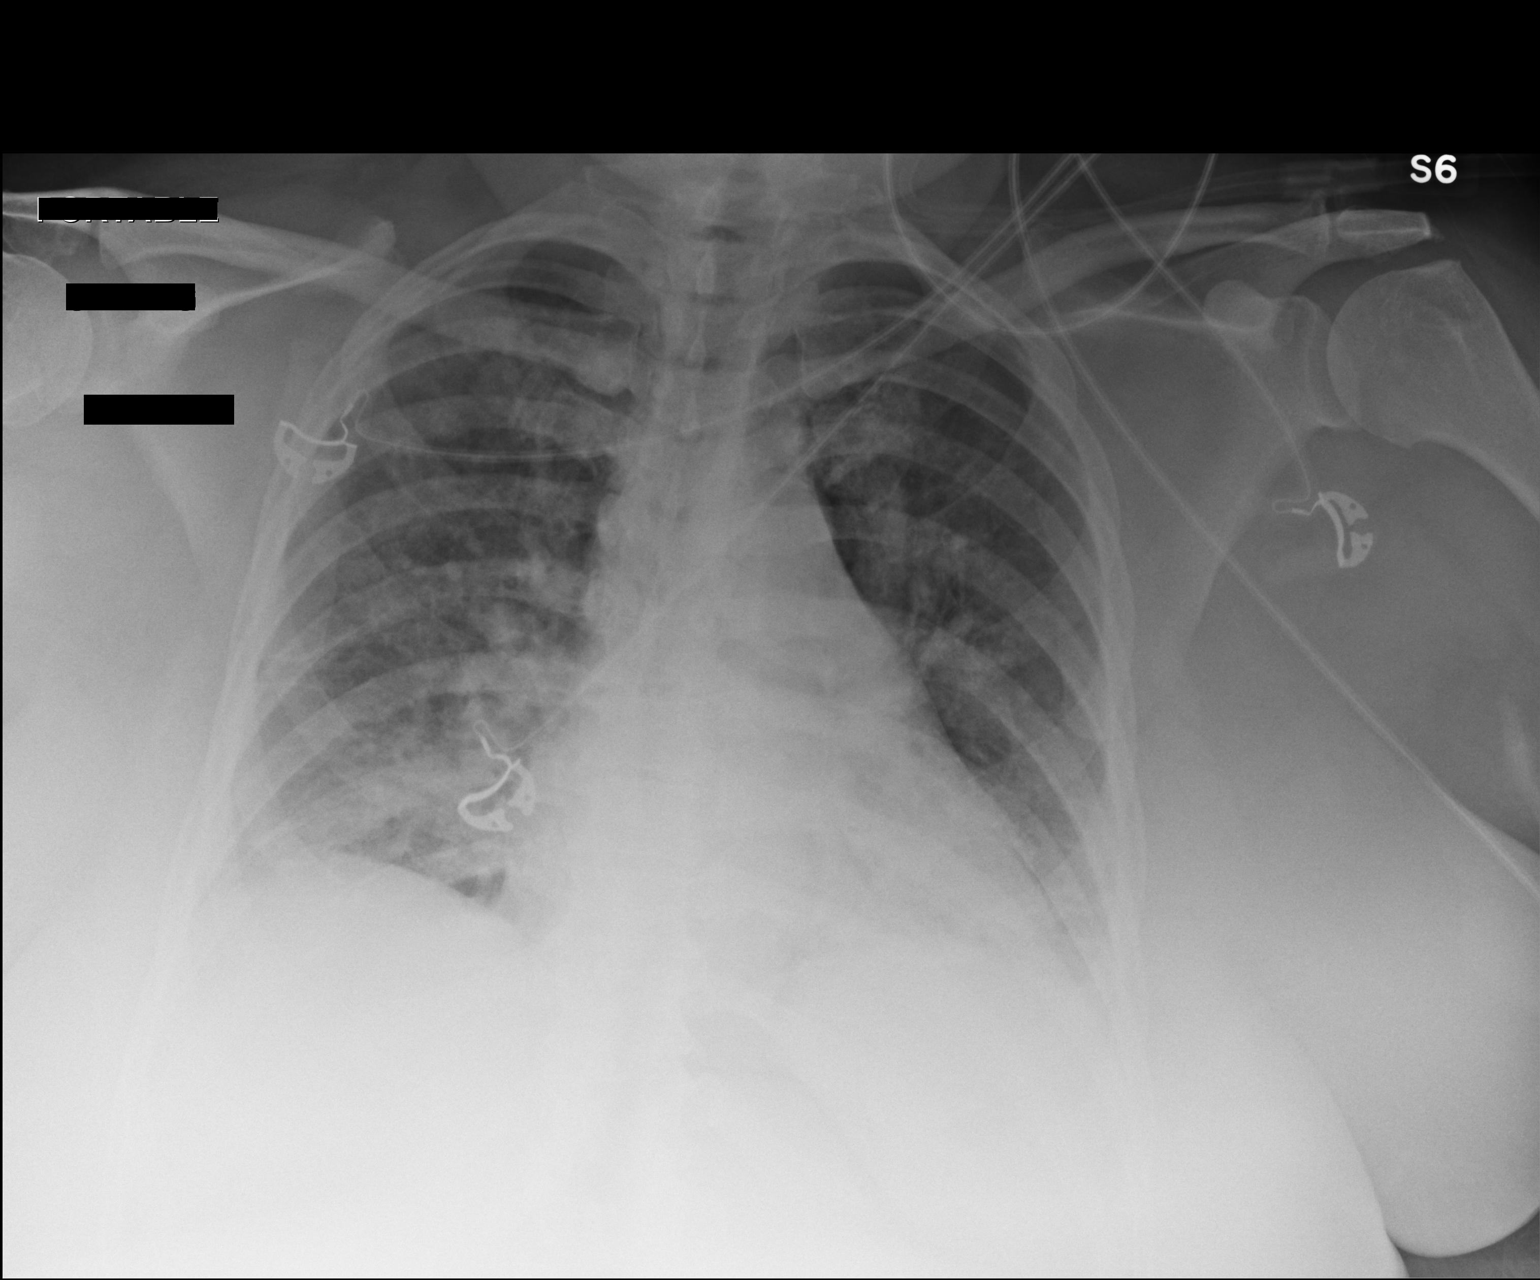

[1 of 1 positions shown; findings below may reference images not displayed]

FINDINGS: Cardiac leads project over the chest. There is mild
cardiomegaly.  Pulmonary vascular congestion is present with
bilateral airspace disease, most prominent in the perihilar regions
and lung bases.  Aeration of the lungs has decreased since
yesterday's chest radiograph.  Trachea is midline.  No visible
pleural effusion.
IMPRESSION: Pulmonary vascular congestion with mild bilateral airspace disease.
Findings are most suggestive of congestive heart failure.  Suggest
clinical correlation.  Infection or aspiration cannot be excluded
by the imaging appearance.

## 2014-04-09 NOTE — Patient Instructions (Signed)
  Practice doing two things at one time at home - writing while listening, talking while folding clothes,   Next session is our last session, then you will be on your own to do activities every day!
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
## 2014-04-14 ENCOUNTER — Ambulatory Visit: Payer: PRIVATE HEALTH INSURANCE

## 2014-04-14 DIAGNOSIS — Z5189 Encounter for other specified aftercare: Secondary | ICD-10-CM | POA: Diagnosis not present

## 2014-04-14 DIAGNOSIS — R41841 Cognitive communication deficit: Secondary | ICD-10-CM

## 2014-04-14 NOTE — Therapy (Signed)
[facility] [address] [place], [place], [address] Phone: [phone]   Fax:  [phone]  Speech Language Pathology Treatment  Patient Details  Name: [name] MRN: [id number] Date of Birth: 12-07-1960  Encounter Date: 04/14/2014      End of Session - 04/14/14 1235    Visit Number 9   Number of Visits 16   Date for SLP Re-Evaluation 04/28/14   SLP Start Time 1152   SLP Stop Time  16   SLP Time Calculation (min) 38 min      Past Medical History  Diagnosis Date  . Hypertension   . Diabetes mellitus   . Asthma   . Anginal pain     a. NL cath in 2008;  b. Myoview 03/2011: dec uptake along mid anterior wall on stress imaging -> ? attenuation vs. ischemia, EF 65%;  c. Echo 04/2011: EF 55-60%, no RWMA, Gr 2 dd  . Depression   . Anxiety   . Pulmonary embolism     a. 2008 -> coumadin x 6 mos.  . Sleep apnea   . 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 Obesity   . CHF (congestive heart failure)   . CHF (congestive heart failure)   . Pulmonary edema   . Cancer   . Bone cancer     Past Surgical History  Procedure Laterality Date  . Abdominal hysterectomy  2005  . Leg surgery    . Tubal ligation  1989  . Left knee surgery  2008  . Laparoscopic appendectomy N/A 06/03/2012    Procedure: APPENDECTOMY LAPAROSCOPIC;  Surgeon: Stark Klein, MD;  Location: [facility];  Service: General;  Laterality: N/A;  . Cardiac catheterization      Normal    There were no vitals taken for this visit.  Visit Diagnosis: Cognitive communication deficit      Subjective Assessment - 04/14/14 1157    Symptoms Pt reports attention differences persisting.              ADULT SLP TREATMENT - 04/14/14 1207    General Information   Behavior/Cognition Alert;Cooperative;Pleasant mood   Treatment Provided   Treatment provided Cognitive-Linquistic   Cognitive-Linquistic Treatment   Treatment focused  on Cognition   Skilled Treatment Pt was educated about how to change her behavior, given her persistent attention difficulty, to modify demands on higher level attention. Pt independent with telling SLP these modifications and generated 2 on her own.    Assessment / Recommendations / Plan   Plan Discharge SLP treatment due to (comment)  financial reasons   Progression Toward Goals   Progression toward goals --  pt met 1/2 LTGs          SLP Education - 04/14/14 1224    Education provided Yes   Education Details modifications to home tasks given pt's higher level attention defiicts   Person(s) Educated Patient   Methods Explanation   Comprehension Verbalized understanding          SLP Short Term Goals - 03/26/14 1546    SLP SHORT TERM GOAL #1   Title Pt to use external memory aid for memory compensation twice during a therapy session   Status Achieved   SLP SHORT TERM GOAL #2   Title Pt to complete working memory tasks, with compensations, at 90% success   Status Achieved   SLP SHORT TERM GOAL #3   Title demo selective attention  in simple task for 10 minutes in mod noisy environment with rare cues back to task   Status Achieved          SLP Long Term Goals - 04/14/14 1242    SLP LONG TERM GOAL #3   Title pt will demo alternating attention tasks with 80% success each task with rare min A back to task   Time 3   Period Weeks   Status Achieved   SLP LONG TERM GOAL #4   Title pt will demo divided attention skills during therapy tasks with 90% each task    Time 3   Period Weeks   Status Not Met          Plan - 04/14/14 1241    Clinical Impression Statement See d/c summary   Treatment/Interventions Environmental controls;Internal/external aids;Compensatory strategies;SLP instruction and feedback;Cognitive reorganization;Patient/family education   SLP Home Exercise Plan SLP provided divided attention tasks for pt to complete at home.   Consulted and Agree with Plan  of Care Patient        Problem List Patient Active Problem List   Diagnosis Date Noted  . Type II or unspecified type diabetes mellitus without mention of complication, uncontrolled 10/29/2012  . S/P laparoscopic appendectomy 06/04/2012  . Acute appendicitis with generalized peritonitis 06/04/2012  . Diastolic CHF 60/63/0160  . Mediastinal mass 01/09/2012  . Hypotension 01/08/2012  . Chest pain 01/08/2012  . DOE (dyspnea on exertion) 06/20/2011  . Mediastinal abnormality 06/20/2011  . Chest pain 05/09/2011  . HYPERCHOLESTEROLEMIA 12/23/2009  . GLAUCOMA 12/23/2009  . ARTHRITIS 12/23/2009  . CHEST PAIN 12/23/2009    Class: Acute  . 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 HYPERLIPIDEMIA NEC/NOS 09/13/2006  . OBESITY NOS 09/13/2006  . ANXIETY STATE NOS 09/13/2006  . DISORDER, DEPRESSIVE NEC 09/13/2006  . CARPAL TUNNEL SYNDROME, MILD 09/13/2006  . Unspecified essential hypertension 09/13/2006  . IBS 09/13/2006  . DEGENERATION, LUMBAR/LUMBOSACRAL DISC 09/13/2006  . SYMPTOM, SWELLING/MASS/LUMP IN CHEST 09/13/2006  . PULMONARY EMBOLISM, HX OF 09/13/2006     SPEECH THERAPY DISCHARGE SUMMARY  Visits from Start of Care: 9  Current functional level related to goals / functional outcomes: Pt met all but one LTG, for divided attention. Success with these tasks was good- excellent in less attention-demanding tasks such as alphabetizing last names and an auditory task, however was less successful in tasks demanding more attention such as deductive reasoning puzzles. Pt met all STGs.   Remaining deficits: Higher level attention deficits. SLP educated pt on how to modify daily tasks to maximize her attention skills.    Education / Equipment: See above.  Plan: Patient agrees to discharge.  Patient goals were partially met. Patient is being discharged due to financial reasons.  ?????       [facility] 04/14/2014, 12:45 PM  [facility] [address] [place], [place], [address] Phone: [phone]   Fax:  [phone]

## 2014-04-14 NOTE — Patient Instructions (Signed)
Divided Attention ideas - any can be done together  Playing a card game either by yourself or with someone  Playing a board game with someone  Writing down commercials on TV or the radio  Writing down news stories on TV or radio  Working outside with yard work, gardening, Social research officer, government.  [employer]  Recite state names and capitals  Complete long division or 2 or 3 digit multiplication problems  Listen to a Psychologist, sport and exercise (i.e., YouTube), to online news ([place], CNN, etc.), or to a family member, and give pertinent information from the speech, news story, or conversation after it is over  Empty the dishwasher  Complete paper/pencil puzzles (sudoku, crosswords, word search)  Complete puzzles online (www.lumosity.com -computer/tablet based, or www.constanttherapy.com - iPad based)  Therapy worksheets/exercises  Put together a puzzle  State coins needed to make a certain change amount  Have someone read a portion of a novel to you and you give a summary    Start at a level that you are challenged by, but also where you can see some success

## 2014-04-16 ENCOUNTER — Ambulatory Visit: Payer: PRIVATE HEALTH INSURANCE

## 2014-04-21 ENCOUNTER — Encounter: Payer: PRIVATE HEALTH INSURANCE | Admitting: Speech Pathology

## 2014-04-27 ENCOUNTER — Emergency Department (HOSPITAL_COMMUNITY): Payer: No Typology Code available for payment source

## 2014-04-27 ENCOUNTER — Emergency Department (HOSPITAL_COMMUNITY)
Admission: EM | Admit: 2014-04-27 | Discharge: 2014-04-27 | Disposition: A | Discharge status: Home / Self Care | Payer: No Typology Code available for payment source | Attending: Emergency Medicine | Admitting: Emergency Medicine

## 2014-04-27 ENCOUNTER — Encounter (HOSPITAL_COMMUNITY): Payer: Self-pay | Admitting: Cardiology

## 2014-04-27 DIAGNOSIS — S299XXA Unspecified injury of thorax, initial encounter: Secondary | ICD-10-CM | POA: Diagnosis not present

## 2014-04-27 DIAGNOSIS — Y92410 Unspecified street and highway as the place of occurrence of the external cause: Secondary | ICD-10-CM | POA: Insufficient documentation

## 2014-04-27 DIAGNOSIS — Y9389 Activity, other specified: Secondary | ICD-10-CM | POA: Insufficient documentation

## 2014-04-27 DIAGNOSIS — S199XXA Unspecified injury of neck, initial encounter: Secondary | ICD-10-CM | POA: Diagnosis present

## 2014-04-27 DIAGNOSIS — Z85830 Personal history of malignant neoplasm of bone: Secondary | ICD-10-CM | POA: Diagnosis not present

## 2014-04-27 DIAGNOSIS — Z79899 Other long term (current) drug therapy: Secondary | ICD-10-CM | POA: Insufficient documentation

## 2014-04-27 DIAGNOSIS — E780 Pure hypercholesterolemia: Secondary | ICD-10-CM | POA: Insufficient documentation

## 2014-04-27 DIAGNOSIS — S161XXA Strain of muscle, fascia and tendon at neck level, initial encounter: Secondary | ICD-10-CM | POA: Diagnosis not present

## 2014-04-27 DIAGNOSIS — Z9889 Other specified postprocedural states: Secondary | ICD-10-CM | POA: Diagnosis not present

## 2014-04-27 DIAGNOSIS — S3992XA Unspecified injury of lower back, initial encounter: Secondary | ICD-10-CM | POA: Diagnosis not present

## 2014-04-27 DIAGNOSIS — Z87891 Personal history of nicotine dependence: Secondary | ICD-10-CM | POA: Insufficient documentation

## 2014-04-27 DIAGNOSIS — F419 Anxiety disorder, unspecified: Secondary | ICD-10-CM | POA: Insufficient documentation

## 2014-04-27 DIAGNOSIS — Z7982 Long term (current) use of aspirin: Secondary | ICD-10-CM | POA: Insufficient documentation

## 2014-04-27 DIAGNOSIS — E119 Type 2 diabetes mellitus without complications: Secondary | ICD-10-CM | POA: Insufficient documentation

## 2014-04-27 DIAGNOSIS — I509 Heart failure, unspecified: Secondary | ICD-10-CM | POA: Insufficient documentation

## 2014-04-27 DIAGNOSIS — I10 Essential (primary) hypertension: Secondary | ICD-10-CM | POA: Diagnosis not present

## 2014-04-27 DIAGNOSIS — Z8669 Personal history of other diseases of the nervous system and sense organs: Secondary | ICD-10-CM | POA: Diagnosis not present

## 2014-04-27 DIAGNOSIS — Y998 Other external cause status: Secondary | ICD-10-CM | POA: Insufficient documentation

## 2014-04-27 DIAGNOSIS — M5441 Lumbago with sciatica, right side: Secondary | ICD-10-CM

## 2014-04-27 DIAGNOSIS — R079 Chest pain, unspecified: Secondary | ICD-10-CM

## 2014-04-27 DIAGNOSIS — E669 Obesity, unspecified: Secondary | ICD-10-CM | POA: Insufficient documentation

## 2014-04-27 DIAGNOSIS — Z7951 Long term (current) use of inhaled steroids: Secondary | ICD-10-CM | POA: Diagnosis not present

## 2014-04-27 DIAGNOSIS — J45909 Unspecified asthma, uncomplicated: Secondary | ICD-10-CM | POA: Insufficient documentation

## 2014-04-27 DIAGNOSIS — Z880 Allergy status to penicillin: Secondary | ICD-10-CM | POA: Insufficient documentation

## 2014-04-27 DIAGNOSIS — Z86711 Personal history of pulmonary embolism: Secondary | ICD-10-CM | POA: Insufficient documentation

## 2014-04-27 DIAGNOSIS — F329 Major depressive disorder, single episode, unspecified: Secondary | ICD-10-CM | POA: Diagnosis not present

## 2014-04-27 LAB — URINALYSIS, ROUTINE W REFLEX MICROSCOPIC
Bilirubin Urine: NEGATIVE
Glucose, UA: NEGATIVE mg/dL
Hgb urine dipstick: NEGATIVE
Ketones, ur: NEGATIVE mg/dL
Leukocytes, UA: NEGATIVE
Nitrite: NEGATIVE
Protein, ur: NEGATIVE mg/dL
Specific Gravity, Urine: 1.014 (ref 1.005–1.030)
Urobilinogen, UA: 0.2 mg/dL (ref 0.0–1.0)
pH: 5.5 (ref 5.0–8.0)

## 2014-04-27 MED ORDER — OXYCODONE-ACETAMINOPHEN 5-325 MG PO TABS: 1.0000 | ORAL_TABLET | ORAL | Status: DC | PRN

## 2014-04-27 MED ORDER — OXYCODONE-ACETAMINOPHEN 5-325 MG PO TABS
2.0000 | ORAL_TABLET | Freq: Once | ORAL | Status: AC
  Administered 2014-04-27: 2 via ORAL
  Filled 2014-04-27: qty 2

## 2014-04-27 MED ORDER — CYCLOBENZAPRINE HCL 10 MG PO TABS: 10.0000 mg | ORAL_TABLET | Freq: Two times a day (BID) | ORAL | Status: DC | PRN
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
## 2014-04-27 NOTE — ED Notes (Signed)
MD at bedside. 

## 2014-04-27 NOTE — ED Notes (Signed)
On assessment pt reports Abd pain on palpation .neck pain on palpation that extends down the full length of spine. Pt also reports Chest pain on palpation.

## 2014-04-28 ENCOUNTER — Ambulatory Visit (INDEPENDENT_AMBULATORY_CARE_PROVIDER_SITE_OTHER): Payer: Medicare Other | Admitting: Neurology

## 2014-04-28 DIAGNOSIS — G4733 Obstructive sleep apnea (adult) (pediatric): Secondary | ICD-10-CM

## 2014-04-28 DIAGNOSIS — I5030 Unspecified diastolic (congestive) heart failure: Secondary | ICD-10-CM

## 2014-04-28 DIAGNOSIS — E662 Morbid (severe) obesity with alveolar hypoventilation: Secondary | ICD-10-CM

## 2014-04-28 DIAGNOSIS — Z9989 Dependence on other enabling machines and devices: Secondary | ICD-10-CM

## 2014-04-28 NOTE — Sleep Study (Signed)
Please see the scanned sleep study interpretation located in the Procedure tab within the Chart Review section. 

## 2014-05-02 ENCOUNTER — Encounter: Payer: Self-pay | Admitting: Neurology

## 2014-05-04 ENCOUNTER — Encounter: Payer: Self-pay | Admitting: Neurology

## 2014-05-12 ENCOUNTER — Telehealth: Payer: Self-pay | Admitting: Neurology

## 2014-05-12 DIAGNOSIS — E662 Morbid (severe) obesity with alveolar hypoventilation: Secondary | ICD-10-CM

## 2014-05-12 DIAGNOSIS — R0902 Hypoxemia: Secondary | ICD-10-CM

## 2014-05-12 DIAGNOSIS — G4733 Obstructive sleep apnea (adult) (pediatric): Secondary | ICD-10-CM

## 2014-05-14 ENCOUNTER — Encounter: Payer: Self-pay | Admitting: *Deleted

## 2014-05-18 ENCOUNTER — Other Ambulatory Visit: Payer: Self-pay | Admitting: Internal Medicine

## 2014-05-18 DIAGNOSIS — E041 Nontoxic single thyroid nodule: Secondary | ICD-10-CM

## 2014-05-24 ENCOUNTER — Observation Stay (HOSPITAL_COMMUNITY): Payer: Medicare Other

## 2014-05-24 ENCOUNTER — Observation Stay (HOSPITAL_COMMUNITY)
Admission: EM | Admit: 2014-05-24 | Discharge: 2014-05-27 | Disposition: A | Discharge status: Home / Self Care | Payer: Medicare Other | Attending: Internal Medicine | Admitting: Internal Medicine

## 2014-05-24 ENCOUNTER — Encounter (HOSPITAL_COMMUNITY): Payer: Self-pay | Admitting: *Deleted

## 2014-05-24 ENCOUNTER — Emergency Department (HOSPITAL_COMMUNITY): Payer: Medicare Other

## 2014-05-24 DIAGNOSIS — R200 Anesthesia of skin: Secondary | ICD-10-CM | POA: Diagnosis not present

## 2014-05-24 DIAGNOSIS — E669 Obesity, unspecified: Secondary | ICD-10-CM | POA: Diagnosis not present

## 2014-05-24 DIAGNOSIS — E119 Type 2 diabetes mellitus without complications: Secondary | ICD-10-CM | POA: Insufficient documentation

## 2014-05-24 DIAGNOSIS — R197 Diarrhea, unspecified: Secondary | ICD-10-CM | POA: Diagnosis not present

## 2014-05-24 DIAGNOSIS — R072 Precordial pain: Secondary | ICD-10-CM

## 2014-05-24 DIAGNOSIS — E876 Hypokalemia: Secondary | ICD-10-CM | POA: Insufficient documentation

## 2014-05-24 DIAGNOSIS — I10 Essential (primary) hypertension: Secondary | ICD-10-CM | POA: Diagnosis not present

## 2014-05-24 DIAGNOSIS — Z880 Allergy status to penicillin: Secondary | ICD-10-CM | POA: Insufficient documentation

## 2014-05-24 DIAGNOSIS — G4733 Obstructive sleep apnea (adult) (pediatric): Secondary | ICD-10-CM | POA: Diagnosis not present

## 2014-05-24 DIAGNOSIS — Z6841 Body Mass Index (BMI) 40.0 and over, adult: Secondary | ICD-10-CM | POA: Insufficient documentation

## 2014-05-24 DIAGNOSIS — R202 Paresthesia of skin: Secondary | ICD-10-CM | POA: Diagnosis not present

## 2014-05-24 DIAGNOSIS — F419 Anxiety disorder, unspecified: Secondary | ICD-10-CM | POA: Insufficient documentation

## 2014-05-24 DIAGNOSIS — R079 Chest pain, unspecified: Principal | ICD-10-CM | POA: Diagnosis present

## 2014-05-24 DIAGNOSIS — E785 Hyperlipidemia, unspecified: Secondary | ICD-10-CM | POA: Diagnosis present

## 2014-05-24 DIAGNOSIS — Z9989 Dependence on other enabling machines and devices: Secondary | ICD-10-CM

## 2014-05-24 DIAGNOSIS — I5030 Unspecified diastolic (congestive) heart failure: Secondary | ICD-10-CM | POA: Diagnosis present

## 2014-05-24 DIAGNOSIS — E780 Pure hypercholesterolemia: Secondary | ICD-10-CM | POA: Diagnosis not present

## 2014-05-24 DIAGNOSIS — I200 Unstable angina: Secondary | ICD-10-CM

## 2014-05-24 DIAGNOSIS — R0602 Shortness of breath: Secondary | ICD-10-CM | POA: Diagnosis present

## 2014-05-24 DIAGNOSIS — I5032 Chronic diastolic (congestive) heart failure: Secondary | ICD-10-CM | POA: Diagnosis present

## 2014-05-24 DIAGNOSIS — J45909 Unspecified asthma, uncomplicated: Secondary | ICD-10-CM | POA: Insufficient documentation

## 2014-05-24 DIAGNOSIS — Z86711 Personal history of pulmonary embolism: Secondary | ICD-10-CM | POA: Insufficient documentation

## 2014-05-24 DIAGNOSIS — K760 Fatty (change of) liver, not elsewhere classified: Secondary | ICD-10-CM | POA: Diagnosis not present

## 2014-05-24 DIAGNOSIS — I5033 Acute on chronic diastolic (congestive) heart failure: Secondary | ICD-10-CM | POA: Diagnosis present

## 2014-05-24 DIAGNOSIS — M5030 Other cervical disc degeneration, unspecified cervical region: Secondary | ICD-10-CM | POA: Insufficient documentation

## 2014-05-24 LAB — CLOSTRIDIUM DIFFICILE BY PCR: Toxigenic C. Difficile by PCR: NEGATIVE

## 2014-05-24 LAB — CBG MONITORING, ED: Glucose-Capillary: 115 mg/dL — ABNORMAL HIGH (ref 70–99)

## 2014-05-24 LAB — BASIC METABOLIC PANEL
Anion gap: 9 (ref 5–15)
BUN: 9 mg/dL (ref 6–23)
CO2: 28 mmol/L (ref 19–32)
Calcium: 9.7 mg/dL (ref 8.4–10.5)
Chloride: 104 mmol/L (ref 96–112)
Creatinine, Ser: 0.84 mg/dL (ref 0.50–1.10)
GFR calc Af Amer: 90 mL/min — ABNORMAL LOW (ref >90)
GFR calc non Af Amer: 77 mL/min — ABNORMAL LOW (ref >90)
Glucose, Bld: 119 mg/dL — ABNORMAL HIGH (ref 70–99)
Potassium: 3.3 mmol/L — ABNORMAL LOW (ref 3.5–5.1)
Sodium: 141 mmol/L (ref 135–145)

## 2014-05-24 LAB — TROPONIN I
Troponin I: 0.03 ng/mL (ref <0.031)
Troponin I: <0.03 ng/mL (ref <0.031)
Troponin I: 0.03 ng/mL
Troponin I: <0.03 ng/mL
Troponin I: <0.03 ng/mL (ref <0.031)

## 2014-05-24 LAB — CBC
HCT: 39.9 % (ref 36.0–46.0)
HCT: 39.6 % (ref 36.0–46.0)
Hemoglobin: 12.7 g/dL (ref 12.0–15.0)
Hemoglobin: 12.6 g/dL (ref 12.0–15.0)
MCH: 27.5 pg (ref 26.0–34.0)
MCH: 27.8 pg (ref 26.0–34.0)
MCHC: 31.8 g/dL (ref 30.0–36.0)
MCHC: 31.8 g/dL (ref 30.0–36.0)
MCV: 86.4 fL (ref 78.0–100.0)
MCV: 87.2 fL (ref 78.0–100.0)
Platelets: 242 10*3/uL (ref 150–400)
Platelets: 211 10*3/uL (ref 150–400)
RBC: 4.62 MIL/uL (ref 3.87–5.11)
RBC: 4.54 MIL/uL (ref 3.87–5.11)
RDW: 13.5 % (ref 11.5–15.5)
RDW: 13.6 % (ref 11.5–15.5)
WBC: 6.3 10*3/uL (ref 4.0–10.5)
WBC: 4.9 10*3/uL (ref 4.0–10.5)

## 2014-05-24 LAB — MRSA PCR SCREENING: MRSA by PCR: NEGATIVE

## 2014-05-24 LAB — CREATININE, SERUM
Creatinine, Ser: 0.76 mg/dL (ref 0.50–1.10)
GFR calc Af Amer: >90 mL/min (ref >90)
GFR calc non Af Amer: >90 mL/min (ref >90)

## 2014-05-24 LAB — GLUCOSE, CAPILLARY
Glucose-Capillary: 87 mg/dL (ref 70–99)
Glucose-Capillary: 84 mg/dL (ref 70–99)

## 2014-05-24 LAB — D-DIMER, QUANTITATIVE: D-Dimer, Quant: 0.99 ug/mL-FEU — ABNORMAL HIGH (ref 0.00–0.48)

## 2014-05-24 LAB — BRAIN NATRIURETIC PEPTIDE: B Natriuretic Peptide: 25.2 pg/mL (ref 0.0–100.0)

## 2014-05-24 MED ORDER — FUROSEMIDE 40 MG PO TABS
40.0000 mg | ORAL_TABLET | Freq: Every day | ORAL | Status: DC
  Administered 2014-05-24 – 2014-05-27 (×4): 40 mg via ORAL
  Filled 2014-05-24 (×5): qty 1

## 2014-05-24 MED ORDER — MORPHINE SULFATE 4 MG/ML IJ SOLN
4.0000 mg | Freq: Once | INTRAMUSCULAR | Status: AC
  Administered 2014-05-24: 4 mg via INTRAVENOUS
  Filled 2014-05-24: qty 1

## 2014-05-24 MED ORDER — SERTRALINE HCL 100 MG PO TABS
100.0000 mg | ORAL_TABLET | Freq: Every day | ORAL | Status: DC
  Administered 2014-05-24 – 2014-05-27 (×4): 100 mg via ORAL
  Filled 2014-05-24 (×4): qty 1

## 2014-05-24 MED ORDER — OXYCODONE-ACETAMINOPHEN 5-325 MG PO TABS
1.0000 | ORAL_TABLET | ORAL | Status: DC | PRN
  Administered 2014-05-24 – 2014-05-27 (×8): 1 via ORAL
  Filled 2014-05-24 (×8): qty 1

## 2014-05-24 MED ORDER — ASPIRIN EC 81 MG PO TBEC: 81.0000 mg | DELAYED_RELEASE_TABLET | Freq: Every day | ORAL | Status: DC

## 2014-05-24 MED ORDER — ATORVASTATIN CALCIUM 40 MG PO TABS
40.0000 mg | ORAL_TABLET | Freq: Every day | ORAL | Status: DC
  Administered 2014-05-24 – 2014-05-26 (×3): 40 mg via ORAL
  Filled 2014-05-24 (×4): qty 1

## 2014-05-24 MED ORDER — PANTOPRAZOLE SODIUM 40 MG PO TBEC
40.0000 mg | DELAYED_RELEASE_TABLET | Freq: Every day | ORAL | Status: DC
  Administered 2014-05-24 – 2014-05-27 (×4): 40 mg via ORAL
  Filled 2014-05-24 (×4): qty 1

## 2014-05-24 MED ORDER — NITROGLYCERIN IN D5W 200-5 MCG/ML-% IV SOLN
5.0000 ug/min | INTRAVENOUS | Status: DC
  Administered 2014-05-24: 5 ug/min via INTRAVENOUS
  Filled 2014-05-24: qty 250

## 2014-05-24 MED ORDER — NITROGLYCERIN 0.4 MG SL SUBL
0.4000 mg | SUBLINGUAL_TABLET | SUBLINGUAL | Status: DC | PRN
  Administered 2014-05-24: 0.4 mg via SUBLINGUAL
  Filled 2014-05-24: qty 1

## 2014-05-24 MED ORDER — ENOXAPARIN SODIUM 40 MG/0.4ML ~~LOC~~ SOLN
40.0000 mg | SUBCUTANEOUS | Status: DC
  Administered 2014-05-24 – 2014-05-27 (×4): 40 mg via SUBCUTANEOUS
  Filled 2014-05-24 (×4): qty 0.4

## 2014-05-24 MED ORDER — POTASSIUM CHLORIDE CRYS ER 20 MEQ PO TBCR
40.0000 meq | EXTENDED_RELEASE_TABLET | Freq: Once | ORAL | Status: AC
  Administered 2014-05-24: 40 meq via ORAL
  Filled 2014-05-24: qty 2

## 2014-05-24 MED ORDER — ALBUTEROL SULFATE (2.5 MG/3ML) 0.083% IN NEBU: 3.0000 mL | INHALATION_SOLUTION | RESPIRATORY_TRACT | Status: DC | PRN

## 2014-05-24 MED ORDER — GABAPENTIN 300 MG PO CAPS
300.0000 mg | ORAL_CAPSULE | Freq: Two times a day (BID) | ORAL | Status: DC
  Administered 2014-05-24 – 2014-05-25 (×4): 300 mg via ORAL
  Filled 2014-05-24 (×6): qty 1

## 2014-05-24 MED ORDER — MORPHINE SULFATE 2 MG/ML IJ SOLN
2.0000 mg | INTRAMUSCULAR | Status: DC | PRN
  Administered 2014-05-24 (×2): 2 mg via INTRAVENOUS
  Filled 2014-05-24 (×2): qty 1

## 2014-05-24 MED ORDER — HEPARIN BOLUS VIA INFUSION
4000.0000 [IU] | Freq: Once | INTRAVENOUS | Status: AC
  Administered 2014-05-24: 4000 [IU] via INTRAVENOUS
  Filled 2014-05-24: qty 4000

## 2014-05-24 MED ORDER — GI COCKTAIL ~~LOC~~
30.0000 mL | Freq: Four times a day (QID) | ORAL | Status: DC | PRN
  Filled 2014-05-24: qty 30

## 2014-05-24 MED ORDER — INSULIN ASPART 100 UNIT/ML ~~LOC~~ SOLN: 0.0000 [IU] | Freq: Every day | SUBCUTANEOUS | Status: DC

## 2014-05-24 MED ORDER — INSULIN ASPART 100 UNIT/ML ~~LOC~~ SOLN
0.0000 [IU] | Freq: Three times a day (TID) | SUBCUTANEOUS | Status: DC
  Administered 2014-05-25 – 2014-05-27 (×3): 1 [IU] via SUBCUTANEOUS

## 2014-05-24 MED ORDER — ASPIRIN EC 325 MG PO TBEC
325.0000 mg | DELAYED_RELEASE_TABLET | Freq: Every day | ORAL | Status: DC
  Administered 2014-05-24 – 2014-05-27 (×4): 325 mg via ORAL
  Filled 2014-05-24 (×4): qty 1

## 2014-05-24 MED ORDER — CARVEDILOL PHOSPHATE ER 10 MG PO CP24
10.0000 mg | ORAL_CAPSULE | Freq: Every day | ORAL | Status: DC
  Administered 2014-05-24 – 2014-05-27 (×4): 10 mg via ORAL
  Filled 2014-05-24 (×4): qty 1

## 2014-05-24 MED ORDER — HEPARIN (PORCINE) IN NACL 100-0.45 UNIT/ML-% IJ SOLN
1100.0000 [IU]/h | INTRAMUSCULAR | Status: DC
  Administered 2014-05-24: 1100 [IU]/h via INTRAVENOUS
  Filled 2014-05-24: qty 250

## 2014-05-24 MED ORDER — CYCLOBENZAPRINE HCL 10 MG PO TABS: 10.0000 mg | ORAL_TABLET | Freq: Two times a day (BID) | ORAL | Status: DC | PRN

## 2014-05-24 MED ORDER — POTASSIUM CHLORIDE CRYS ER 10 MEQ PO TBCR
10.0000 meq | EXTENDED_RELEASE_TABLET | Freq: Two times a day (BID) | ORAL | Status: DC
  Administered 2014-05-24 – 2014-05-25 (×3): 10 meq via ORAL
  Filled 2014-05-24 (×3): qty 1

## 2014-05-24 MED ORDER — TIZANIDINE HCL 2 MG PO TABS
2.0000 mg | ORAL_TABLET | Freq: Every day | ORAL | Status: DC
  Administered 2014-05-24 – 2014-05-26 (×3): 2 mg via ORAL
  Filled 2014-05-24 (×4): qty 1

## 2014-05-24 MED ORDER — LORATADINE 10 MG PO TABS
10.0000 mg | ORAL_TABLET | Freq: Every day | ORAL | Status: DC
  Administered 2014-05-24 – 2014-05-27 (×4): 10 mg via ORAL
  Filled 2014-05-24 (×4): qty 1

## 2014-05-24 MED ORDER — LISINOPRIL 20 MG PO TABS
20.0000 mg | ORAL_TABLET | Freq: Every day | ORAL | Status: DC
  Administered 2014-05-24 – 2014-05-27 (×4): 20 mg via ORAL
  Filled 2014-05-24 (×4): qty 1

## 2014-05-24 MED ORDER — ACETAMINOPHEN 325 MG PO TABS: 650.0000 mg | ORAL_TABLET | ORAL | Status: DC | PRN

## 2014-05-24 MED ORDER — FLUTICASONE PROPIONATE HFA 44 MCG/ACT IN AERO
2.0000 | INHALATION_SPRAY | Freq: Two times a day (BID) | RESPIRATORY_TRACT | Status: DC
  Administered 2014-05-24 – 2014-05-27 (×4): 2 via RESPIRATORY_TRACT
  Filled 2014-05-24: qty 10.6

## 2014-05-24 MED ORDER — ONDANSETRON HCL 4 MG/2ML IJ SOLN
4.0000 mg | Freq: Four times a day (QID) | INTRAMUSCULAR | Status: DC | PRN
  Administered 2014-05-24: 4 mg via INTRAVENOUS
  Filled 2014-05-24: qty 2

## 2014-05-24 MED ORDER — IOHEXOL 350 MG/ML SOLN
100.0000 mL | Freq: Once | INTRAVENOUS | Status: AC | PRN
  Administered 2014-05-24: 100 mL via INTRAVENOUS

## 2014-05-24 MED ORDER — ISOSORBIDE DINITRATE 20 MG PO TABS
20.0000 mg | ORAL_TABLET | Freq: Two times a day (BID) | ORAL | Status: DC
  Administered 2014-05-24 – 2014-05-27 (×7): 20 mg via ORAL
  Filled 2014-05-24 (×8): qty 1

## 2014-05-24 MED ORDER — NITROGLYCERIN 0.4 MG SL SUBL
0.4000 mg | SUBLINGUAL_TABLET | SUBLINGUAL | Status: DC | PRN
  Administered 2014-05-24 (×2): 0.4 mg via SUBLINGUAL
  Filled 2014-05-24: qty 1

## 2014-05-24 MED ORDER — ASPIRIN 81 MG PO CHEW
324.0000 mg | CHEWABLE_TABLET | Freq: Once | ORAL | Status: AC
  Administered 2014-05-24: 324 mg via ORAL
  Filled 2014-05-24: qty 4

## 2014-05-24 NOTE — Progress Notes (Signed)
VASCULAR LAB PRELIMINARY  PRELIMINARY  PRELIMINARY  PRELIMINARY  Carotid Dopplers completed.    Preliminary report:  1-39% ICA stenosis.  Vertebral artery flow is antegrade.   Dontasia Miranda, RVT 05/24/2014, 3:59 PM

## 2014-05-24 NOTE — Progress Notes (Signed)
ANTICOAGULATION CONSULT NOTE - Initial Consult  Pharmacy Consult for heparin Indication: chest pain/ACS  Allergies  Allergen Reactions  . Hydrocodone Other (See Comments)    Causes Hallucinations  . Penicillins Swelling  . Sulfa Antibiotics Diarrhea, Itching and Rash    Patient Measurements: Height: 4' 11.75" (151.8 cm) Weight: 222 lb 0.1 oz (100.7 kg) IBW/kg (Calculated) : 44.93 Heparin Dosing Weight: 75kg  Vital Signs: Temp: 98.3 F (36.8 C) (01/31 0236) Temp Source: Oral (01/31 0236) BP: 118/63 mmHg (01/31 0600) Pulse Rate: 85 (01/31 0600)  Labs:  Recent Labs  05/24/14 0237  HGB 12.7  HCT 39.9  PLT 242  CREATININE 0.84  TROPONINI 0.03    Estimated Creatinine Clearance: 81.2 mL/min (by C-G formula based on Cr of 0.84).   Medical History: Past Medical History  Diagnosis Date  . Hypertension   . Diabetes mellitus   . Asthma   . Anginal pain     a. NL cath in 2008;  b. Myoview 03/2011: dec uptake along mid anterior wall on stress imaging -> ? attenuation vs. ischemia, EF 65%;  c. Echo 04/2011: EF 55-60%, no RWMA, Gr 2 dd  . Depression   . Anxiety   . Pulmonary embolism     a. 2008 -> coumadin x 6 mos.  . Sleep apnea   . 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 Obesity   . CHF (congestive heart failure)   . CHF (congestive heart failure)   . Pulmonary edema   . Cancer   . Bone cancer     Assessment: 54yo female c/o central CP radiating to shoulder and LUE progressing to LUE numbness, had some N/V/SOB, has h/o blood clots though not currently on anticoagulants, initial troponin negative, to begin heparin.  Goal of Therapy:  Heparin level 0.3-0.7 units/ml Monitor platelets by anticoagulation protocol: Yes   Plan:  Will give heparin bolus of 4000 units followed by gtt at 1100 units/hr and monitor heparin levels and CBC.  Wynona Neat, PharmD, BCPS  05/24/2014,6:23 AM

## 2014-05-24 NOTE — ED Notes (Signed)
Admitting team at bedside.

## 2014-05-24 NOTE — ED Notes (Signed)
Pt O2 saturation at 89% on room air while sleeping, pt placed on 2L O2 nasal cannula, she reports that she uses a CPAP at home. 98% at this time.
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
## 2014-05-24 NOTE — ED Notes (Signed)
Attempted report 

## 2014-05-24 NOTE — ED Notes (Signed)
The pt arm pain is worse with movement

## 2014-05-24 NOTE — ED Notes (Signed)
Cardiology at bedside.

## 2014-05-24 NOTE — ED Notes (Signed)
Fonnie Mu - Daughter - [phone]

## 2014-05-24 NOTE — ED Notes (Signed)
The pt is c/o lt upper chest pain and lt arm pain since 1900  With some sob. Hx of chf

## 2014-05-24 NOTE — ED Notes (Signed)
Pt reports central cp, then it went to her left shoulder and radiated down left arm. Pt states she started having some numbness to left arm. Pt reports she was walking in the grocery store when sx started. Pt rates pain 10/10, she thought it was indigestion but it didn't go away and she was unable to sleep. Pt had some nausea and vomiting with pain earlier and sob. Pt states she has hx of blood clots.

## 2014-05-24 NOTE — ED Provider Notes (Signed)
CSN: [id number]     Arrival date & time 05/24/14  0228 History   This chart was scribed for Varney Biles, MD by Chester Holstein, ED Scribe. This patient was seen in room B18C/B18C and the patient's care was started at 3:45 AM.    Chief Complaint  Patient presents with  . Chest Pain     Patient is a 54 y.o. female presenting with chest pain. The history is provided by the patient. No language interpreter was used.  Chest Pain Associated symptoms: headache, nausea, shortness of breath and vomiting    HPI Comments: [name] is a 54 y.o. female with PMHx of PE , HTN, CHF, DM, sleep apnea, and anxiety who presents to the Emergency Department complaining of constant 10/10 chest pain with onset around 7 PM. Pt notes pain started with left shoulder, radiating to chest,  left arm and back with numbness in left hand. Pt notes she was in a store at the time of onset. Pt notes she thought pain was related to indigestion, but states pain worsened later in evening and she was unable to sleep.  Pt notes movement. Pt has no cough. She thinks deep breath makes her pain worse. When she was diagnosed with PE, pt has dib, hemoptysis.    Past Medical History  Diagnosis Date  . Hypertension   . Diabetes mellitus   . Asthma   . Anginal pain     a. NL cath in 2008;  b. Myoview 03/2011: dec uptake along mid anterior wall on stress imaging -> ? attenuation vs. ischemia, EF 65%;  c. Echo 04/2011: EF 55-60%, no RWMA, Gr 2 dd  . Depression   . Anxiety   . Pulmonary embolism     a. 2008 -> coumadin x 6 mos.  . Sleep apnea   . 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 Obesity   . CHF (congestive heart failure)   . CHF (congestive heart failure)   . Pulmonary edema   . Cancer   . Bone cancer    Past Surgical History  Procedure Laterality Date  . Abdominal hysterectomy  2005  . Leg surgery    . Tubal ligation  1989  . Left knee surgery  2008  . Laparoscopic appendectomy N/A  06/03/2012    Procedure: APPENDECTOMY LAPAROSCOPIC;  Surgeon: Stark Klein, MD;  Location: [facility];  Service: General;  Laterality: N/A;  . Cardiac catheterization      Normal   Family History  Problem Relation Age of Onset  . Emphysema Mother   . Arthritis Mother   . Heart failure Mother     alive @ 78  . Asthma Brother   . Heart disease Paternal Grandfather   . Heart disease Father     died @ 45's.  . Stroke Father   . Colon cancer Maternal Grandfather    History  Substance Use Topics  . Smoking status: Former Smoker -- 0.50 packs/day for 15 years    Types: Cigarettes    Quit date: 04/24/1985  . Smokeless tobacco: Not on file  . Alcohol Use: No   OB History    No data available     Review of Systems  Respiratory: Positive for shortness of breath.   Cardiovascular: Positive for chest pain.  Gastrointestinal: Positive for nausea and vomiting.  Musculoskeletal: Positive for myalgias.  Neurological: Positive for headaches.  All other systems reviewed and are negative.  Allergies  Hydrocodone; Penicillins; and Sulfa antibiotics  Home Medications   Prior to Admission medications   Medication Sig Start Date End Date Taking? Authorizing Provider  albuterol (PROVENTIL,VENTOLIN) 90 MCG/ACT inhaler Inhale 2 puffs into the lungs every 4 (four) hours as needed for shortness of breath.    Yes Historical Provider, MD  aspirin EC 81 MG tablet Take 81 mg by mouth daily.   Yes Historical Provider, MD  atorvastatin (LIPITOR) 40 MG tablet Take 40 mg by mouth at bedtime.    Yes Historical Provider, MD  beclomethasone (QVAR) 80 MCG/ACT inhaler Inhale 2 puffs into the lungs 2 (two) times daily.    Yes Historical Provider, MD  BENICAR HCT 40-12.5 MG per tablet Take 1 tablet by mouth daily.  03/11/14  Yes Historical Provider, MD  carvedilol (COREG CR) 10 MG 24 hr capsule Take 10 mg by mouth daily.   Yes Historical Provider, MD  cyclobenzaprine (FLEXERIL) 10 MG tablet Take 1 tablet (10  mg total) by mouth 2 (two) times daily as needed for muscle spasms. 04/27/14  Yes Gareth Morgan, MD  furosemide (LASIX) 40 MG tablet Take 40 mg by mouth daily.   Yes Ripudeep Krystal Eaton, MD  gabapentin (NEURONTIN) 300 MG capsule Take 300 mg by mouth 2 (two) times daily. 01/28/14  Yes Historical Provider, MD  isosorbide dinitrate (ISORDIL) 20 MG tablet Take 20 mg by mouth 2 (two) times daily.    Yes Historical Provider, MD  KLOR-CON M10 10 MEQ tablet Take 10 mEq by mouth 2 (two) times daily.  09/23/13  Yes Historical Provider, MD  lisinopril (PRINIVIL,ZESTRIL) 20 MG tablet Take 20 mg by mouth daily.    Yes Historical Provider, MD  loperamide (IMODIUM) 2 MG capsule Take 2 mg by mouth as needed for diarrhea or loose stools.   Yes Historical Provider, MD  loratadine (CLARITIN) 10 MG tablet Take 10 mg by mouth daily.   Yes Historical Provider, MD  Multiple Vitamins-Minerals (MULTIVITAMIN PO) Take 1 tablet by mouth daily.   Yes Historical Provider, MD  omeprazole (PRILOSEC) 20 MG capsule Take 20 mg by mouth daily.     Yes Historical Provider, MD  oxyCODONE-acetaminophen (PERCOCET/ROXICET) 5-325 MG per tablet Take 1 tablet by mouth every 4 (four) hours as needed for moderate pain or severe pain. 04/27/14  Yes Gareth Morgan, MD  Saxagliptin-Metformin (KOMBIGLYZE XR) 2.08-998 MG TB24 Take 1 tablet by mouth daily.   Yes Historical Provider, MD  sertraline (ZOLOFT) 100 MG tablet Take 100 mg by mouth daily.    Yes Historical Provider, MD  tiZANidine (ZANAFLEX) 2 MG tablet Take 2 mg by mouth at bedtime.  02/05/14  Yes Historical Provider, MD  nitroGLYCERIN (NITROSTAT) 0.4 MG SL tablet Place 0.4 mg under the tongue every 5 (five) minutes as needed. For chest pain.    Historical Provider, MD   BP 118/63 mmHg  Pulse 85  Temp(Src) 98.3 F (36.8 C) (Oral)  Resp 21  Ht 4' 11.75" (1.518 m)  Wt 222 lb 0.1 oz (100.7 kg)  BMI 43.70 kg/m2  SpO2 93% Physical Exam  Constitutional: She appears well-developed and  well-nourished. No distress.  HENT:  Head: Normocephalic and atraumatic.  Eyes: Conjunctivae are normal. Right eye exhibits no discharge. Left eye exhibits no discharge.  Neck: Neck supple. No JVD present.  Cardiovascular: Normal rate and regular rhythm.  Exam reveals no gallop and no friction rub.   Murmur (systolic) heard. Pulses:      Radial pulses are 2+ on the  right side, and 2+ on the left side.  Pulmonary/Chest: Effort normal and breath sounds normal. No respiratory distress.  Lungs clear to auscultation bilaterally  Abdominal: Soft. She exhibits no distension. There is no tenderness.  Musculoskeletal: She exhibits no edema or tenderness.  No leg swelling, no pitting edema, no calf tenderness  Neurological: She is alert.  Skin: Skin is warm and dry.  Psychiatric: She has a normal mood and affect. Her behavior is normal. Thought content normal.  Nursing note and vitals reviewed.   ED Course  Procedures (including critical care time) DIAGNOSTIC STUDIES: Oxygen Saturation is 95% on room air, normal by my interpretation.    COORDINATION OF CARE: 3:50 AM Discussed treatment plan with patient at beside, the patient agrees with the plan and has no further questions at this time.   Labs Review Labs Reviewed  BASIC METABOLIC PANEL - Abnormal; Notable for the following:    Potassium 3.3 (*)    Glucose, Bld 119 (*)    GFR calc non Af Amer 77 (*)    GFR calc Af Amer 90 (*)    All other components within normal limits  CBC  BRAIN NATRIURETIC PEPTIDE  TROPONIN I  TROPONIN I    Imaging Review Dg Chest 2 View  05/24/2014   CLINICAL DATA:  Chest pain radiating to the left arm with left arm numbness. Shortness of breath and cough.  EXAM: CHEST  2 VIEW  COMPARISON:  04/27/2014  FINDINGS: Normal heart size and pulmonary vascularity. Increased opacities over the lung base is probably due to soft tissue attenuation. No focal airspace disease or consolidation. No blunting of costophrenic  angles. No pneumothorax. Mediastinal contours appear intact. Degenerative changes in the spine.  IMPRESSION: No active cardiopulmonary disease.   Electronically Signed   By: Lucienne Capers M.D.   On: 05/24/2014 03:16     EKG Interpretation   Date/Time:  Sunday May 24 2014 02:32:37 EST Ventricular Rate:  92 PR Interval:  172 QRS Duration: 100 QT Interval:  372 QTC Calculation: 460 R Axis:   68 Text Interpretation:  Normal sinus rhythm Normal ECG Nonspecific ST and T  wave abnormality No significant change since last tracing Confirmed by  Kathrynn Humble, MD, Thelma Comp [phone]) on 05/24/2014 3:35:50 AM      MDM   Final diagnoses:  Unstable angina    I personally performed the services described in this documentation, which was scribed in my presence. The recorded information has been reviewed and is accurate.  Pt comes in with cc of chest pain. Chest pain has some typical features, L sided, radiating to the shoulder and to the arm, but also has some atypical features - worse with inspiration and with palpation. Pain started whilst walking, and there is no muscular straining factor.  Clinically, no concerns for PE. ACS and muscular pain are higher on the ddx. However, knowing that we have a diabetic, woman patient, and that ACS can present as muscular pain - we consulted Cards. Cards recommends admission and serial trops. Medicine to admit.  CRITICAL CARE Performed by: Varney Biles   Total critical care time: 55 min  Critical care time was exclusive of separately billable procedures and treating other patients.  Critical care was necessary to treat or prevent imminent or life-threatening deterioration.  Critical care was time spent personally by me on the following activities: development of treatment plan with patient and/or surrogate as well as nursing, discussions with consultants, evaluation of patient's response to treatment, examination of  patient, obtaining history from  patient or surrogate, ordering and performing treatments and interventions, ordering and review of laboratory studies, ordering and review of radiographic studies, pulse oximetry and re-evaluation of patient's condition.     Varney Biles, MD 05/24/14 [phone]

## 2014-05-24 NOTE — H&P (Signed)
[facility] History and Physical                                                                                    Patient Demographics  [name], is a 54 y.o. female  MRN: [id number]   DOB - Dec 12, 1960  Admit Date - 05/24/2014  Outpatient Primary MD for the patient is Tara Hatchet, MD   With History of -  Past Medical History  Diagnosis Date  . Hypertension   . Diabetes mellitus   . Asthma   . Anginal pain     a. NL cath in 2008;  b. Myoview 03/2011: dec uptake along mid anterior wall on stress imaging -> ? attenuation vs. ischemia, EF 65%;  c. Echo 04/2011: EF 55-60%, no RWMA, Gr 2 dd  . Depression   . Anxiety   . Pulmonary embolism     a. 2008 -> coumadin x 6 mos.  . Sleep apnea   . 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 Obesity   . CHF (congestive heart failure)   . CHF (congestive heart failure)   . Pulmonary edema   . Cancer   . Bone cancer       Past Surgical History  Procedure Laterality Date  . Abdominal hysterectomy  2005  . Leg surgery    . Tubal ligation  1989  . Left knee surgery  2008  . Laparoscopic appendectomy N/A 06/03/2012    Procedure: APPENDECTOMY LAPAROSCOPIC;  Surgeon: Stark Klein, MD;  Location: [place];  Service: General;  Laterality: N/A;  . Cardiac catheterization      Normal    in for   Chief Complaint  Patient presents with  . Chest Pain     HPI  [name]  is a 54 y.o. female, with a pmh of CHF (EF 55% in 2013), PE, DM, OSA, Obesity, drove herself to the ER this morning after developing chest pain at rest last night at approx 7 pm.  The chest pain is left sided, radiates to the shoulder, feels like a heavy ache, at first was unrelenting, now is intermittent.  She vomited yellow fluid 3x last night after the CP started.  She denies previous hx of these symptoms.  She states it is worse with movement, exertion, palpation and deep inspiration.  [name] has numerous other complaints  including diarrhea, and right sided back pain.  In the ER her troponin and BNP are negative.  Cardiology has seen her and recommends admission for  ACS rule out.  Review of Systems    In addition to the HPI above,   No Headache, No changes with Vision or hearing, No problems swallowing food or Liquids, No Blood in stool or Urine, No dysuria, No new skin rashes or bruises, No new joints pains-aches,  No polyuria, polydypsia or polyphagia,   A full 10 point Review of Systems was done, except as stated above, all other Review of Systems were negative.   Social History History  Substance Use Topics  . Smoking status: Former Smoker -- 0.50 packs/day for 15 years    Types: Cigarettes  Quit date: 04/24/1985  . Smokeless tobacco: Not on file  . Alcohol Use: No  Lives at home alone with her dog.  Independent with ADLs.  Not working.  Denies alcohol and rec. Drugs.   Family History Family History  Problem Relation Age of Onset  . Emphysema Mother   . Arthritis Mother   . Heart failure Mother     alive @ 58  . Asthma Brother   . Heart disease Paternal Grandfather   . Heart disease Father     died @ 25's.  . Stroke Father   . Colon cancer Maternal Grandfather   father died with CVA & MI, mother is alive but had a CVA.   Prior to Admission medications   Medication Sig Start Date End Date Taking? Authorizing Provider  albuterol (PROVENTIL,VENTOLIN) 90 MCG/ACT inhaler Inhale 2 puffs into the lungs every 4 (four) hours as needed for shortness of breath.    Yes Historical Provider, MD  aspirin EC 81 MG tablet Take 81 mg by mouth daily.   Yes Historical Provider, MD  atorvastatin (LIPITOR) 40 MG tablet Take 40 mg by mouth at bedtime.    Yes Historical Provider, MD  beclomethasone (QVAR) 80 MCG/ACT inhaler Inhale 2 puffs into the lungs 2 (two) times daily.    Yes Historical Provider, MD  BENICAR HCT 40-12.5 MG per tablet Take 1 tablet by mouth daily.  03/11/14  Yes Historical  Provider, MD  carvedilol (COREG CR) 10 MG 24 hr capsule Take 10 mg by mouth daily.   Yes Historical Provider, MD  cyclobenzaprine (FLEXERIL) 10 MG tablet Take 1 tablet (10 mg total) by mouth 2 (two) times daily as needed for muscle spasms. 04/27/14  Yes Gareth Morgan, MD  furosemide (LASIX) 40 MG tablet Take 40 mg by mouth daily.   Yes Ripudeep Krystal Eaton, MD  gabapentin (NEURONTIN) 300 MG capsule Take 300 mg by mouth 2 (two) times daily. 01/28/14  Yes Historical Provider, MD  isosorbide dinitrate (ISORDIL) 20 MG tablet Take 20 mg by mouth 2 (two) times daily.    Yes Historical Provider, MD  KLOR-CON M10 10 MEQ tablet Take 10 mEq by mouth 2 (two) times daily.  09/23/13  Yes Historical Provider, MD  lisinopril (PRINIVIL,ZESTRIL) 20 MG tablet Take 20 mg by mouth daily.    Yes Historical Provider, MD  loperamide (IMODIUM) 2 MG capsule Take 2 mg by mouth as needed for diarrhea or loose stools.   Yes Historical Provider, MD  loratadine (CLARITIN) 10 MG tablet Take 10 mg by mouth daily.   Yes Historical Provider, MD  Multiple Vitamins-Minerals (MULTIVITAMIN PO) Take 1 tablet by mouth daily.   Yes Historical Provider, MD  omeprazole (PRILOSEC) 20 MG capsule Take 20 mg by mouth daily.     Yes Historical Provider, MD  oxyCODONE-acetaminophen (PERCOCET/ROXICET) 5-325 MG per tablet Take 1 tablet by mouth every 4 (four) hours as needed for moderate pain or severe pain. 04/27/14  Yes Gareth Morgan, MD  Saxagliptin-Metformin (KOMBIGLYZE XR) 2.08-998 MG TB24 Take 1 tablet by mouth daily.   Yes Historical Provider, MD  sertraline (ZOLOFT) 100 MG tablet Take 100 mg by mouth daily.    Yes Historical Provider, MD  tiZANidine (ZANAFLEX) 2 MG tablet Take 2 mg by mouth at bedtime.  02/05/14  Yes Historical Provider, MD  nitroGLYCERIN (NITROSTAT) 0.4 MG SL tablet Place 0.4 mg under the tongue every 5 (five) minutes as needed. For chest pain.    Historical Provider, MD  Allergies  Allergen Reactions  . Hydrocodone Other (See  Comments)    Causes Hallucinations  . Penicillins Swelling  . Sulfa Antibiotics Diarrhea, Itching and Rash    Physical Exam  Vitals  Blood pressure 107/53, pulse 87, temperature 98.3 F (36.8 C), temperature source Oral, resp. rate 19, height 4' 11.75" (1.518 m), weight 100.7 kg (222 lb 0.1 oz), SpO2 95 %.   General:  Obese, female, in some discomfort, shifting around in bed.  Psych:  Normal affect and insight, Not Suicidal or Homicidal, Awake Alert, Oriented X 3.  Neuro:  Decreased sensation in the LLE, strength is symmetric, CN 2-12 are intact.  ENT:  Ears and Eyes appear Normal, Conjunctivae clear, PER. Moist Oral Mucosa.  Neck:  Supple Neck, No JVD, No cervical lymphadenopathy appreciated  Respiratory:  Symmetrical Chest wall movement, Good air movement bilaterally, CTAB.  Cardiac:  RRR, No Gallops, Rubs or Murmurs, No Parasternal Heave.  Abdomen:  Positive Bowel Sounds, Abdomen Soft, Non tender, No organomegaly appreciated  Skin:  No Cyanosis, Normal Skin Turgor, No Skin Rash or Bruise.  Extremities:  Good muscle tone,  joints appear normal , no effusions   Data Review  CBC  Recent Labs Lab 05/24/14 0237  WBC 6.3  HGB 12.7  HCT 39.9  PLT 242  MCV 86.4  MCH 27.5  MCHC 31.8  RDW 13.5   ------------------------------------------------------------------------------------------------------------------  Chemistries   Recent Labs Lab 05/24/14 0237  NA 141  K 3.3*  CL 104  CO2 28  GLUCOSE 119*  BUN 9  CREATININE 0.84  CALCIUM 9.7    Cardiac Enzymes  Recent Labs Lab 05/24/14 0237 05/24/14 0642  TROPONINI 0.03 <0.03    ----------------------------------------------------------------------------------------------------------------  Imaging results:   Dg Chest 2 View  05/24/2014   CLINICAL DATA:  Chest pain radiating to the left arm with left arm numbness. Shortness of breath and cough.  EXAM: CHEST  2 VIEW  COMPARISON:  04/27/2014  FINDINGS:  Normal heart size and pulmonary vascularity. Increased opacities over the lung base is probably due to soft tissue attenuation. No focal airspace disease or consolidation. No blunting of costophrenic angles. No pneumothorax. Mediastinal contours appear intact. Degenerative changes in the spine.  IMPRESSION: No active cardiopulmonary disease.   Electronically Signed   By: Lucienne Capers M.D.   On: 05/24/2014 03:16     My personal review of EKG: NSR.     Assessment & Plan  Principal Problem:   Chest pain at rest Active Problems:   HYPERCHOLESTEROLEMIA   DOE (dyspnea on exertion)   Diastolic CHF   OSA on CPAP   Chest Pain Patient has both typical and atypical symptoms.  DOE, left sided radiates to shoulder, vomiting. Will admit to cardiac tele and cycle troponins. Check DDimer (Hx of PE in 2008), update 2D echo. Cardiology is on board.  Aspirin 325 mg, SL nitro, Oxygen PRN, GI Cocktail PRN, Protonix daily. She was mildly hypotensive on admission.  Will hold HCTZ.  Is on both ARB and ACE.  Will hold ARB (as it is a combination pill w/ HCTZ)  Left sided numbness Given DM, and family history will check CT head, MR brain.  2D echo ordered for CP.  Will add Carotid dopplers. PT eval.  Diarrhea Check cdiff, if negative can start imodium.  Stool cultures ordered as well.  Contact precautions.  D-CHF Patient appears euvolemic.  BNP is normal.  Will continue BB.  Updating 2D echo.  Heart healthy diet.  DM Holding oral  medications.  Placing on SSI -S in house.   Patient reports CBGs have been controlled on oral medications.  OSA CPAP at night.   DVT Prophylaxis:  lovenox  AM Labs Ordered, also please review Full Orders  Family Communication:   None at bedside. Patient is alert and orientated.   Code Status:  full  Likely DC to  home  Condition:  Guarded.  Time spent in minutes : 60   York, Bobby Rumpf PA-C on 05/24/2014 at 8:58 AM  Between 7am to 7pm - Pager -  [phone]  After 7pm go to [email or web address] - password [email or web address]  And look for the night coverage person covering me after hours  [facility]  Attending Patient was seen, examined,treatment plan was discussed with the  Advance Practice Provider.  I have directly reviewed the clinical findings, lab, imaging studies and management of this patient in detail. I have made the necessary changes to the above noted documentation, and agree with the documentation, as recorded by the Advance Practice Provider.   Nena Alexander MD [facility].

## 2014-05-25 DIAGNOSIS — R079 Chest pain, unspecified: Secondary | ICD-10-CM | POA: Diagnosis not present

## 2014-05-25 LAB — GLUCOSE, CAPILLARY
Glucose-Capillary: 130 mg/dL — ABNORMAL HIGH (ref 70–99)
Glucose-Capillary: 108 mg/dL — ABNORMAL HIGH (ref 70–99)
Glucose-Capillary: 122 mg/dL — ABNORMAL HIGH (ref 70–99)
Glucose-Capillary: 100 mg/dL — ABNORMAL HIGH (ref 70–99)

## 2014-05-25 MED ORDER — POTASSIUM CHLORIDE CRYS ER 20 MEQ PO TBCR
20.0000 meq | EXTENDED_RELEASE_TABLET | Freq: Two times a day (BID) | ORAL | Status: DC
  Administered 2014-05-25 – 2014-05-27 (×4): 20 meq via ORAL
  Filled 2014-05-25 (×5): qty 1

## 2014-05-25 MED ORDER — POTASSIUM CHLORIDE CRYS ER 20 MEQ PO TBCR
40.0000 meq | EXTENDED_RELEASE_TABLET | Freq: Once | ORAL | Status: AC
  Administered 2014-05-25: 40 meq via ORAL
  Filled 2014-05-25: qty 2

## 2014-05-25 NOTE — Progress Notes (Signed)
Patient ID: [name], female   DOB: 03-Jan-1961, 54 y.o.   MRN: [id number]    Subjective:  Numbness in left arm Positional chest pain  Objective:  Filed Vitals:   05/25/14 0342 05/25/14 0400 05/25/14 0700 05/25/14 0744  BP: 103/55 117/64 143/80   Pulse: 70   75  Temp: 98.3 F (36.8 C)   98.5 F (36.9 C)  TempSrc: Oral   Oral  Resp: 14 13  14   Height:      Weight:      SpO2: 100% 100% 100%     Intake/Output from previous day:  Intake/Output Summary (Last 24 hours) at 05/25/14 0746 Last data filed at 05/24/14 1800  Gross per 24 hour  Intake      0 ml  Output   1275 ml  Net  -1275 ml    Physical Exam: Affect appropriate Obese black female  HEENT: normal Neck supple with no adenopathy JVP normal no bruits no thyromegaly Lungs clear with no wheezing and good diaphragmatic motion Heart:  S1/S2 no murmur, no rub, gallop or click PMI normal Abdomen: benighn, BS positve, no tenderness, no AAA no bruit.  No HSM or HJR Distal pulses intact with no bruits No edema Neuro non-focal Skin warm and dry No muscular weakness   Lab Results: Basic Metabolic Panel:  Recent Labs  05/24/14 0237 05/24/14 1420  NA 141  --   K 3.3*  --   CL 104  --   CO2 28  --   GLUCOSE 119*  --   BUN 9  --   CREATININE 0.84 0.76  CALCIUM 9.7  --    CBC:  Recent Labs  05/24/14 0237 05/24/14 1420  WBC 6.3 4.9  HGB 12.7 12.6  HCT 39.9 39.6  MCV 86.4 87.2  PLT 242 211   Cardiac Enzymes:  Recent Labs  05/24/14 1420 05/24/14 1600 05/24/14 1850  TROPONINI 0.03 <0.03 <0.03   BNP: Invalid input(s): POCBNP D-Dimer:  Recent Labs  05/24/14 0842  DDIMER 0.99*    Imaging: Dg Chest 2 View  05/24/2014   CLINICAL DATA:  Chest pain radiating to the left arm with left arm numbness. Shortness of breath and cough.  EXAM: CHEST  2 VIEW  COMPARISON:  04/27/2014  FINDINGS: Normal heart size and pulmonary vascularity. Increased opacities over the lung base is probably due to  soft tissue attenuation. No focal airspace disease or consolidation. No blunting of costophrenic angles. No pneumothorax. Mediastinal contours appear intact. Degenerative changes in the spine.  IMPRESSION: No active cardiopulmonary disease.   Electronically Signed   By: Lucienne Capers M.D.   On: 05/24/2014 03:16   Ct Head Wo Contrast  05/24/2014   CLINICAL DATA:  Left arm numbness.  EXAM: CT HEAD WITHOUT CONTRAST  TECHNIQUE: Contiguous axial images were obtained from the base of the skull through the vertex without intravenous contrast.  COMPARISON:  CT scan of June 25, 2013.  FINDINGS: Bony calvarium appears intact. No mass effect or midline shift is noted. Ventricular size is within normal limits. There is no evidence of mass lesion, hemorrhage or acute infarction.  IMPRESSION: Normal head CT.   Electronically Signed   By: Sabino Dick M.D.   On: 05/24/2014 10:25   Ct Angio Chest Pe W/cm &/or Wo Cm  05/24/2014   CLINICAL DATA:  Chest pain radiating into the left arm.  EXAM: CT ANGIOGRAPHY CHEST WITH CONTRAST  TECHNIQUE: Multidetector CT imaging of the chest was performed using the  standard protocol during bolus administration of intravenous contrast. Multiplanar CT image reconstructions and MIPs were obtained to evaluate the vascular anatomy.  CONTRAST:  100 mL OMNIPAQUE IOHEXOL 350 MG/ML SOLN  COMPARISON:  CT chest 02/24/2014.  PA and lateral chest 04/27/2014.  FINDINGS: The study is mildly degraded by respiratory motion. No pulmonary embolus is identified. There is cardiomegaly. The thyroid gland is enlarged and heterogeneous but unchanged in appearance. Cardiomegaly is again seen. No pleural or pericardial effusion. No axillary, hilar or mediastinal lymphadenopathy. The lungs show only mild dependent atelectasis. Visualized upper abdomen demonstrates fatty infiltration of the liver. No focal bony abnormality is identified.  Review of the MIP images confirms the above findings.  IMPRESSION: Negative for  pulmonary embolus.  Cardiomegaly.  Fatty infiltration liver.  Thyromegaly, unchanged.   Electronically Signed   By: Inge Rise M.D.   On: 05/24/2014 13:06   Mr Brain Wo Contrast  05/25/2014   CLINICAL DATA:  Acute onset chest pain beginning last night, LEFT chest pain radiating to the shoulder. Vomiting. Numbness and tingling in LEFT arm. History of hypertension, diabetes, CHF, cancer. History of Rathke's cleft cyst.  EXAM: MRI HEAD WITHOUT CONTRAST  TECHNIQUE: Multiplanar, multiecho pulse sequences of the brain and surrounding structures were obtained without intravenous contrast.  COMPARISON:  CT of the head May 24, 2014 at 935 hr, MRI of the pituitary March 16, 2014  FINDINGS: No reduced diffusion to suggest acute ischemia. No susceptibility artifact to suggest hemorrhage.  Ventricles and sulci are normal for age. Scattered subcentimeter supratentorial white matter FLAIR T2 hyperintensities, unchanged though, better seen on today's 3 tesla examination. No midline shift, mass effect.  No abnormal extra-axial fluid collections. Normal major intracranial vascular flow voids are seen. 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 No paranasal sinus air-fluid levels. Mastoid air cells are well aerated. Ocular globes and orbital contents are unremarkable though not tailored for evaluation. No cerebellar tonsillar ectopia.  IMPRESSION: No acute intracranial process, specifically no acute ischemia.  Mild white matter changes are nonspecific, can be seen with chronic small vessel ischemic disease, stable.  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   Electronically Signed   By: Elon Alas   On: 05/25/2014 01:36    Cardiac Studies:  ECG:  1/31  SR normal ECG   Telemetry:  NSR no arrhythmia   Echo:  pending  Medications:   . aspirin EC  325 mg Oral Daily  . atorvastatin  40 mg Oral QHS  . carvedilol  10 mg Oral Daily    . enoxaparin (LOVENOX) injection  40 mg Subcutaneous Q24H  . fluticasone  2 puff Inhalation BID  . furosemide  40 mg Oral Daily  . gabapentin  300 mg Oral BID  . insulin aspart  0-5 Units Subcutaneous QHS  . insulin aspart  0-9 Units Subcutaneous TID WC  . isosorbide dinitrate  20 mg Oral BID  . lisinopril  20 mg Oral Daily  . loratadine  10 mg Oral Daily  . pantoprazole  40 mg Oral Daily  . potassium chloride  10 mEq Oral BID  . sertraline  100 mg Oral Daily  . tiZANidine  2 mg Oral QHS       Assessment/Plan:  Chest Pain:  Atypical r/o normal ECG  Cath 2008 normal myovue 2014 normal  No further cardiac w/u indicated   CT negative for PE Does not need to be in CCU Chol;  On statin HTN:  On ACE DM:  SS insulin  Further plans per [facility] 05/25/2014, 7:46 AM

## 2014-05-25 NOTE — Progress Notes (Signed)
UR completed 

## 2014-05-25 NOTE — Evaluation (Signed)
Physical Therapy Evaluation Patient Details Name: [name] MRN: [id number] DOB: Nov 12, 1960 Today's Date: 05/25/2014   History of Present Illness  Pt adm with chest pain and left arm numbness. Cardiac source ruled out. MRI negative. PMH - heart failure, DM, HTN, depression/anxiety, obesity  Clinical Impression  Pt admitted with above diagnosis. Pt currently with functional limitations due to the deficits listed below (see PT Problem List).  Pt will benefit from skilled PT to increase their independence and safety with mobility to allow discharge to the venue listed below.  Pt c/o rt leg numbness but functionally did not seem to impact her mobility. Pt self limited with amb distance due to fatigue. Should be able to return to previous living situation.     Follow Up Recommendations Home health PT;Supervision - Intermittent    Equipment Recommendations  None recommended by PT    Recommendations for Other Services       Precautions / Restrictions Precautions Precautions: Fall      Mobility  Bed Mobility Overal bed mobility: Modified Independent Bed Mobility: Supine to Sit     Supine to sit: Supervision;HOB elevated     General bed mobility comments: Incr time and effort  Transfers Overall transfer level: Needs assistance Equipment used: Rolling walker (2 wheeled);None Transfers: Sit to/from [employer] to Stand: Min guard Stand pivot transfers: Min guard       General transfer comment: Incr time and effort but no physical assist needed.  Ambulation/Gait Ambulation/Gait assistance: Supervision Ambulation Distance (Feet): 25 Feet Assistive device: Rolling walker (2 wheeled) Gait Pattern/deviations: Step-through pattern;Decreased step length - right;Decreased step length - left;Shuffle Gait velocity: decr Gait velocity interpretation: Below normal speed for age/gender General Gait Details: Pt slow gait and fatigued quickly but no loss of  balance.  Stairs            Wheelchair Mobility    Modified Rankin (Stroke Patients Only)       Balance Overall balance assessment: Needs assistance Sitting-balance support: No upper extremity supported;Feet supported Sitting balance-Leahy Scale: Normal     Standing balance support: No upper extremity supported;During functional activity Standing balance-Leahy Scale: Fair                               Pertinent Vitals/Pain Pain Assessment: No/denies pain    Home Living Family/patient expects to be discharged to:: Private residence Living Arrangements: Alone Available Help at Discharge: Family Type of Home: Apartment Home Access: Level entry     Home Layout: One level Home Equipment: Environmental consultant - 2 wheels      Prior Function Level of Independence: Independent         Comments: sedentary     Hand Dominance   Dominant Hand: Right    Extremity/Trunk Assessment   Upper Extremity Assessment: Generalized weakness           Lower Extremity Assessment: Generalized weakness;RLE deficits/detail RLE Deficits / Details: Pt reports numbness in rt leg.       Communication   Communication: No difficulties  Cognition Arousal/Alertness: Awake/alert Behavior During Therapy: WFL for tasks assessed/performed Overall Cognitive Status: Within Functional Limits for tasks assessed                      General Comments      Exercises        Assessment/Plan    PT Assessment Patient needs continued PT services  PT Diagnosis Difficulty walking;Generalized weakness   PT Problem List Decreased strength;Decreased activity tolerance;Decreased balance;Decreased mobility;Obesity  PT Treatment Interventions DME instruction;Gait training;Balance training;Functional mobility training;Patient/family education;Therapeutic activities;Therapeutic exercise   PT Goals (Current goals can be found in the Care Plan section) Acute Rehab PT Goals Patient  Stated Goal: return home PT Goal Formulation: With patient Time For Goal Achievement: 06/01/14 Potential to Achieve Goals: Good    Frequency Min 3X/week   Barriers to discharge        Co-evaluation               End of Session   Activity Tolerance: Patient limited by fatigue Patient left: in chair;with call bell/phone within reach Nurse Communication: Mobility status         Time: 1142-1207 PT Time Calculation (min) (ACUTE ONLY): 25 min   Charges:   PT Evaluation $Initial PT Evaluation Tier I: 1 Procedure PT Treatments $Gait Training: 8-22 mins   PT G Codes:        [name] 2014/06/20, 12:59 PM  [facility] PT [phone]

## 2014-05-25 NOTE — Progress Notes (Signed)
[facility] TEAM 1 - Stepdown/ICU TEAM Progress Note  [name] DDU:[id number] DOB: 03/09/1961 DOA: 05/24/2014 PCP: Velna Hatchet, MD  Admit HPI / Brief Narrative: 54 yo F with a hx of CHF (EF 55% in 2013), PE, DM, OSA, and Obesity who drove herself to the ER after developing chest pain at rest. The chest pain was left sided, radiated to the shoulder, felt like a heavy ache, at first was unrelenting then became intermittent. She vomited yellow fluid 3x after the CP started. She denied previous hx of these symptoms. She stated it was worse with movement, exertion, palpation and deep inspiration. In the ER her troponin and BNP were negative.   HPI/Subjective: Pt reports that her chest pain has resolved, as has her associated L arm/hand numbness.  Now, however, she reports the onset of R arm and leg numbness as of this morning.  She denies focal weakness, HA, acute visual change, or change in speech.    Assessment/Plan:  Chest pain CTangio of chest negative for PE - per Cardiology "normal ECG, Cath 2008 normal, myovue 2014 normal> no further cardiac w/u indicated"   L sided numbness  > R sided numbness  MRI notes no evidence of CVA or other acute findings - check C37 / folic acid - migratory nature makes CNS source very unlikely   Diarrhea  C diff negative - reports has resolved   Hypokalemia  Replace and follow   Chronic diastolic CHF TTE pending for re-evaluation  HTN Currently well controlled   DM CBG currently well controlled  Depression w/ anxiety  Hx of PE 2008 CTangio chest negative for acute PE   Obesity - Body mass index is 43.7 kg/(m^2).   Fatty infiltrate of liver Noted on CTangio of chest - weight loss needed - discussed w/ pt - will need to be followed in outpt setting   OSA  Nightly CPAP - only tolerated for 2 hrs last night   Code Status: FULL Family Communication: no family present at time of exam Disposition Plan: transfer to med bed -  PT/OT evals   Consultants: [facility]   Procedures: 1/31 - carotid dopplers - 1-39% ICA stenosis B TTE - pending   Antibiotics: none  DVT prophylaxis: lovenox  Objective: Blood pressure 140/77, pulse 75, temperature 98.5 F (36.9 C), temperature source Oral, resp. rate 15, height 4' 11.75" (1.518 m), weight 100.7 kg (222 lb 0.1 oz), SpO2 91 %.  Intake/Output Summary (Last 24 hours) at 05/25/14 1023 Last data filed at 05/25/14 0900  Gross per 24 hour  Intake      0 ml  Output   1575 ml  Net  -1575 ml   Exam: General: No acute respiratory distress Lungs: Clear to auscultation bilaterally without wheezes or crackles Cardiovascular: Regular rate and rhythm without murmur gallop or rub normal S1 and S2 Abdomen: Nontender, nondistended, soft, bowel sounds positive, no rebound, no ascites, no appreciable mass Extremities: No significant cyanosis, clubbing, or edema bilateral lower extremities Neuro:  Alert and oriented x4 - CN II-XII intact B - 5/5 strength B U&LE - intact fine motor control B hands   Data Reviewed: Basic Metabolic Panel:  Recent Labs Lab 05/24/14 0237 05/24/14 1420  NA 141  --   K 3.3*  --   CL 104  --   CO2 28  --   GLUCOSE 119*  --   BUN 9  --   CREATININE 0.84 0.76  CALCIUM 9.7  --  Liver Function Tests: No results for input(s): AST, ALT, ALKPHOS, BILITOT, PROT, ALBUMIN in the last 168 hours. No results for input(s): LIPASE, AMYLASE in the last 168 hours. No results for input(s): AMMONIA in the last 168 hours.  CBC:  Recent Labs Lab 05/24/14 0237 05/24/14 1420  WBC 6.3 4.9  HGB 12.7 12.6  HCT 39.9 39.6  MCV 86.4 87.2  PLT 242 211    Cardiac Enzymes:  Recent Labs Lab 05/24/14 0237 05/24/14 0642 05/24/14 1420 05/24/14 1600 05/24/14 1850  TROPONINI 0.03 <0.03 0.03 <0.03 <0.03   BNP (last 3 results)  Recent Labs  12/01/13 1439 02/24/14 1231  PROBNP 71.8 36.4    CBG:  Recent Labs Lab 05/24/14 1220  05/24/14 1625 05/24/14 2144 05/25/14 0747  GLUCAP 115* 87 84 130*    Recent Results (from the past 240 hour(s))  Clostridium Difficile by PCR     Status: None   Collection Time: 05/24/14  9:27 AM  Result Value Ref Range Status   C difficile by pcr NEGATIVE NEGATIVE Final  MRSA PCR Screening     Status: None   Collection Time: 05/24/14 12:55 PM  Result Value Ref Range Status   MRSA by PCR NEGATIVE NEGATIVE Final    Comment:        The GeneXpert MRSA Assay (FDA approved for NASAL specimens only), is one component of a comprehensive MRSA colonization surveillance program. It is not intended to diagnose MRSA infection nor to guide or monitor treatment for MRSA infections.      Studies:  Recent x-ray studies have been reviewed in detail by the Attending Physician  Scheduled Meds:  Scheduled Meds: . aspirin EC  325 mg Oral Daily  . atorvastatin  40 mg Oral QHS  . carvedilol  10 mg Oral Daily  . enoxaparin (LOVENOX) injection  40 mg Subcutaneous Q24H  . fluticasone  2 puff Inhalation BID  . furosemide  40 mg Oral Daily  . gabapentin  300 mg Oral BID  . insulin aspart  0-5 Units Subcutaneous QHS  . insulin aspart  0-9 Units Subcutaneous TID WC  . isosorbide dinitrate  20 mg Oral BID  . lisinopril  20 mg Oral Daily  . loratadine  10 mg Oral Daily  . pantoprazole  40 mg Oral Daily  . potassium chloride  10 mEq Oral BID  . sertraline  100 mg Oral Daily  . tiZANidine  2 mg Oral QHS    Time spent on care of this patient: 35 mins   MCCLUNG,JEFFREY T , MD   [facility] Office  [phone] Pager - Text Page per Shea Evans as per below:  On-Call/Text Page:      [email or web address]      password [email or web address]  If 7PM-7AM, please contact night-coverage [email or web address] Password [email or web address] 05/25/2014, 10:23 AM   LOS: 1 day

## 2014-05-25 NOTE — Progress Notes (Signed)
Patient wore C-Pap for 2 hours tonight.  Patient started pulling at mask and trying to take it from her face. C-Pap removed and 2L/[place] reapplied with sats in 95% range. Will monitor closely.

## 2014-05-26 ENCOUNTER — Observation Stay (HOSPITAL_COMMUNITY): Payer: Medicare Other

## 2014-05-26 DIAGNOSIS — R202 Paresthesia of skin: Secondary | ICD-10-CM

## 2014-05-26 DIAGNOSIS — R072 Precordial pain: Secondary | ICD-10-CM

## 2014-05-26 DIAGNOSIS — R079 Chest pain, unspecified: Secondary | ICD-10-CM | POA: Diagnosis not present

## 2014-05-26 DIAGNOSIS — R200 Anesthesia of skin: Secondary | ICD-10-CM | POA: Insufficient documentation

## 2014-05-26 LAB — COMPREHENSIVE METABOLIC PANEL
ALT: 32 U/L (ref 0–35)
AST: 19 U/L (ref 0–37)
Albumin: 3.3 g/dL — ABNORMAL LOW (ref 3.5–5.2)
Alkaline Phosphatase: 50 U/L (ref 39–117)
Anion gap: 6 (ref 5–15)
BUN: 10 mg/dL (ref 6–23)
CO2: 29 mmol/L (ref 19–32)
Calcium: 8.7 mg/dL (ref 8.4–10.5)
Chloride: 105 mmol/L (ref 96–112)
Creatinine, Ser: 0.81 mg/dL (ref 0.50–1.10)
GFR calc Af Amer: >90 mL/min (ref >90)
GFR calc non Af Amer: 81 mL/min — ABNORMAL LOW (ref >90)
Glucose, Bld: 92 mg/dL (ref 70–99)
Potassium: 4 mmol/L (ref 3.5–5.1)
Sodium: 140 mmol/L (ref 135–145)
Total Bilirubin: 0.5 mg/dL (ref 0.3–1.2)
Total Protein: 6.1 g/dL (ref 6.0–8.3)

## 2014-05-26 LAB — GLUCOSE, CAPILLARY
Glucose-Capillary: 99 mg/dL (ref 70–99)
Glucose-Capillary: 92 mg/dL (ref 70–99)
Glucose-Capillary: 106 mg/dL — ABNORMAL HIGH (ref 70–99)
Glucose-Capillary: 144 mg/dL — ABNORMAL HIGH (ref 70–99)

## 2014-05-26 LAB — FOLATE: Folate: 18.8 ng/mL

## 2014-05-26 LAB — VITAMIN B12: Vitamin B-12: 432 pg/mL (ref 211–911)

## 2014-05-26 IMAGING — CT CT ANGIO CHEST
1 of 4 series · 19 of 31 positions shown · IV contrast (APPLIED)
Comparison: Chest CTA 04/27/2012.

CLINICAL DATA: Diarrhea with shortness of breath and chest
pressure.  History of congestive heart failure.  History of
pulmonary embolism.  Question recurrent pulmonary embolism.

CT ANGIOGRAPHY CHEST
TECHNIQUE: Multidetector CT imaging of the chest using the
standard protocol during bolus administration of intravenous
contrast. Multiplanar reconstructed images including MIPs were
obtained and reviewed to evaluate the vascular anatomy.
Contrast: 100mL OMNIPAQUE IOHEXOL 350 MG/ML SOLN

[Series 6: pulm embolism 1.0 b25f st · axial · 0.54mm/px · z∈[-260,-22]mm · 19 of 266 slices shown]
[im 14/266  lung]
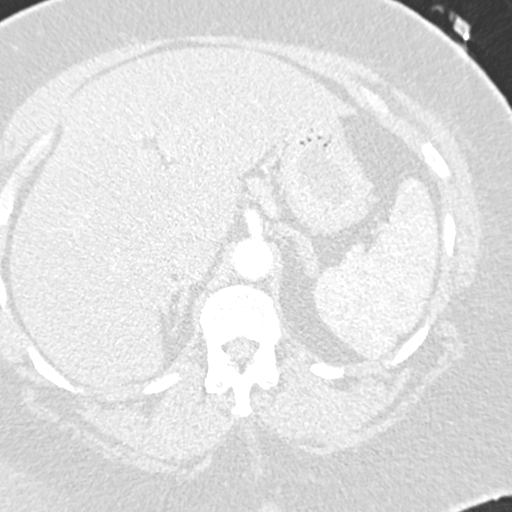
[im 28/266  mediastinal]
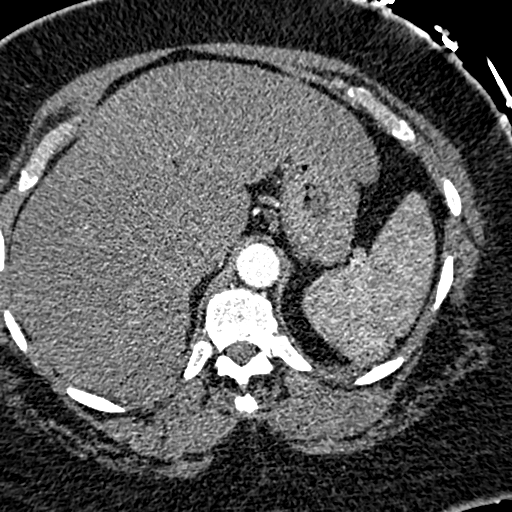
[im 42/266  lung]
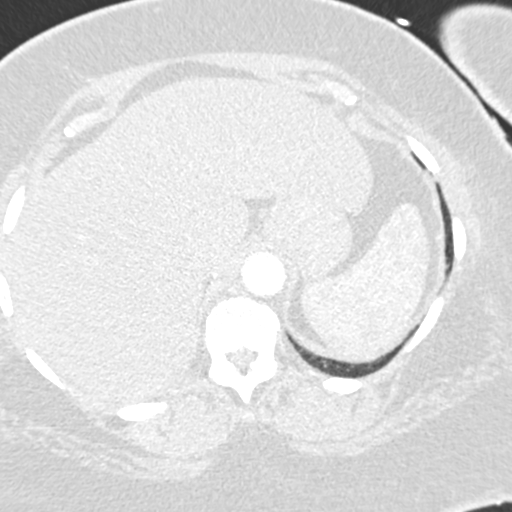
[im 56/266  mediastinal]
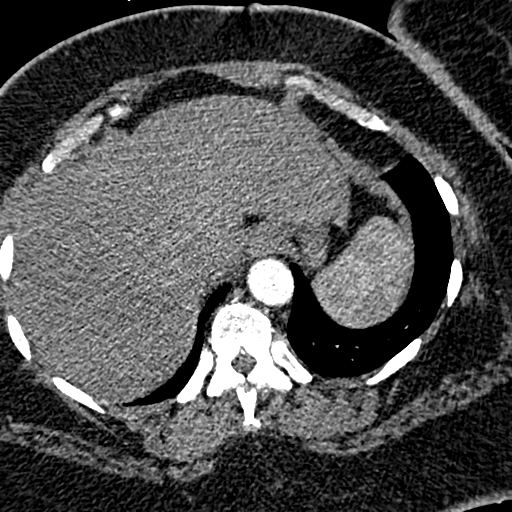
[im 70/266  lung]
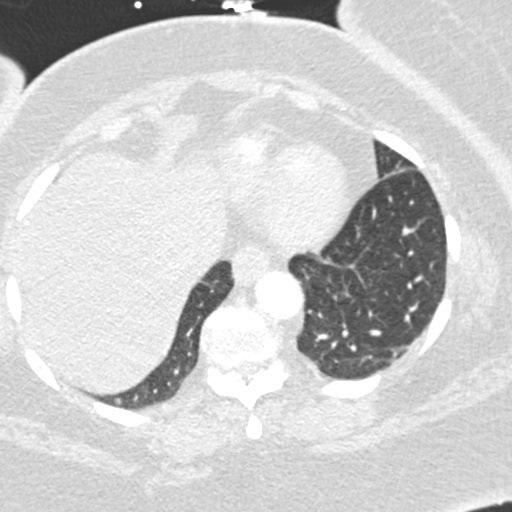
[im 84/266  mediastinal]
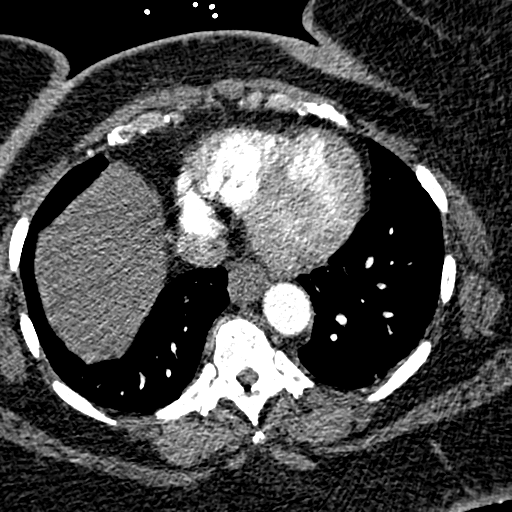
[im 98/266  lung]
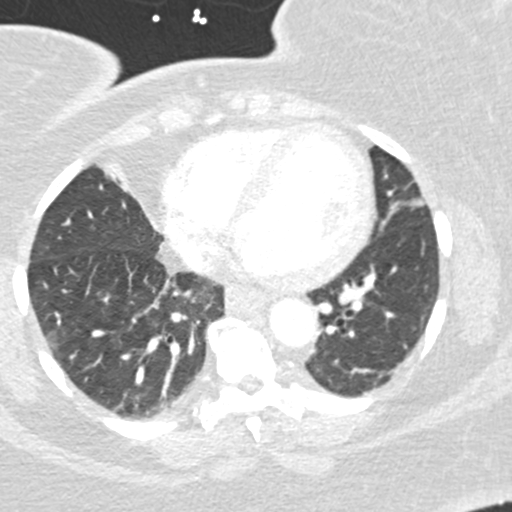
[im 112/266  mediastinal]
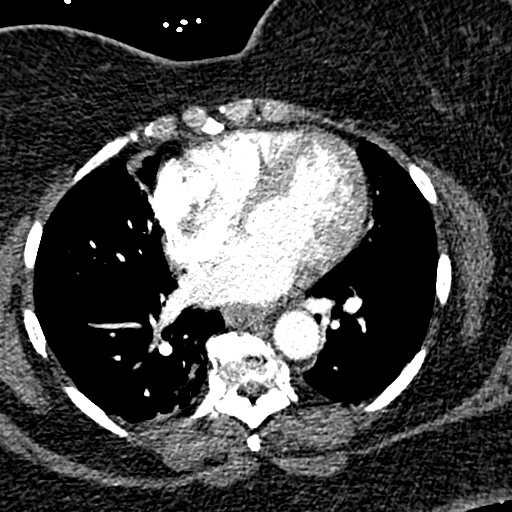
[im 126/266  lung]
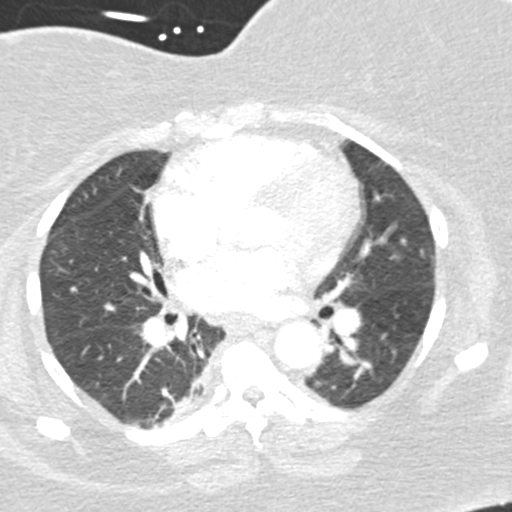
[im 133/266  mediastinal]
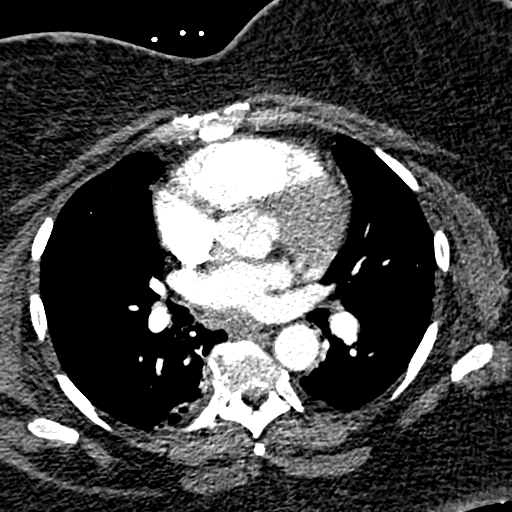
[im 140/266  lung]
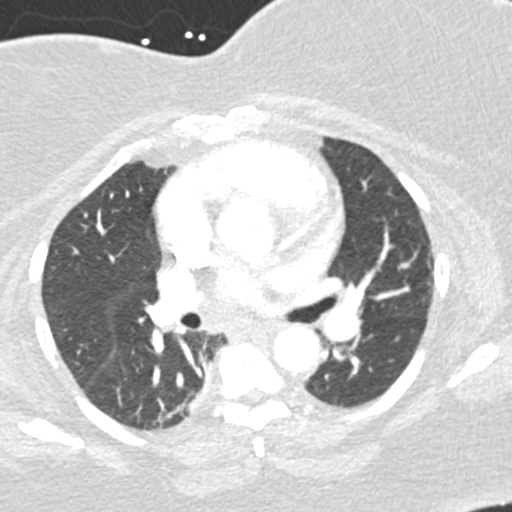
[im 154/266  mediastinal]
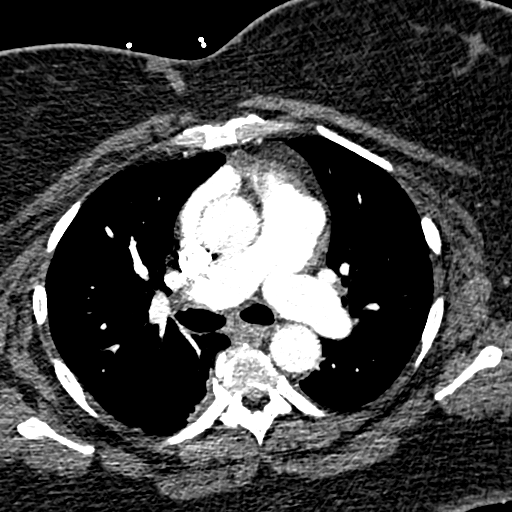
[im 168/266  lung]
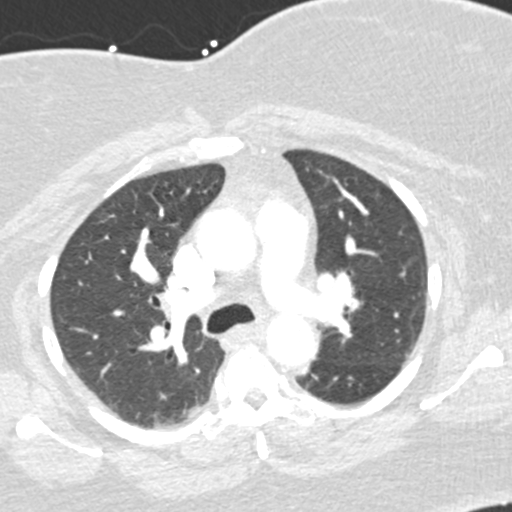
[im 182/266  mediastinal]
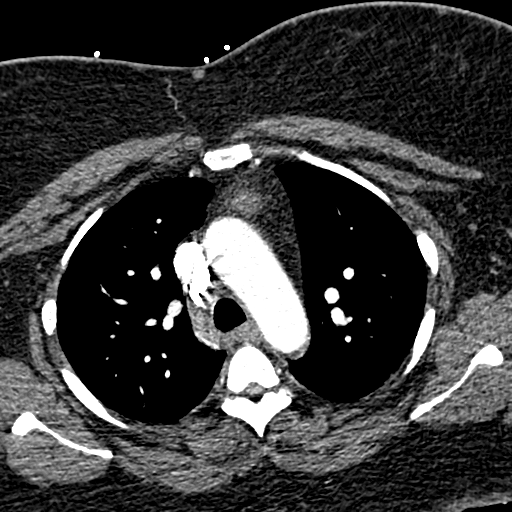
[im 196/266  lung]
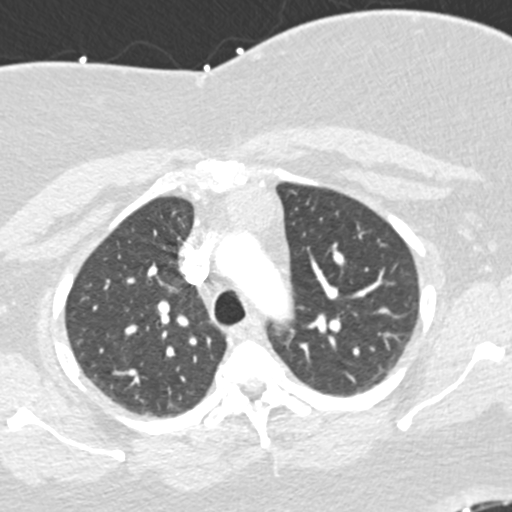
[im 210/266  mediastinal]
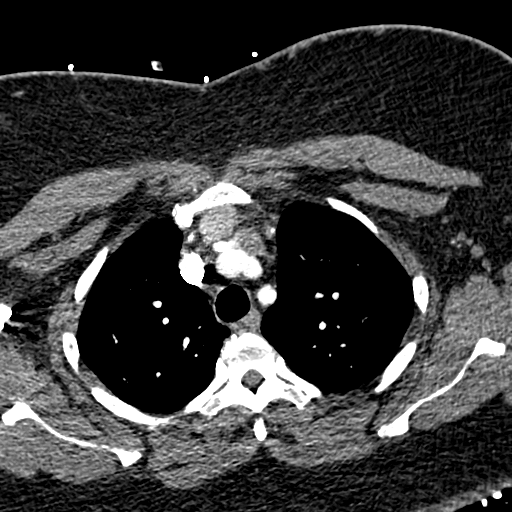
[im 224/266  lung]
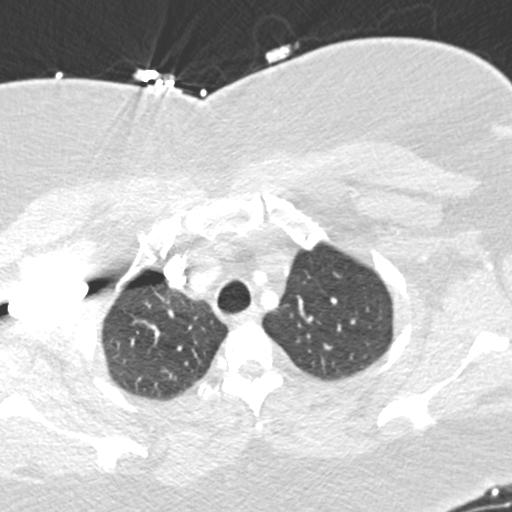
[im 238/266  mediastinal]
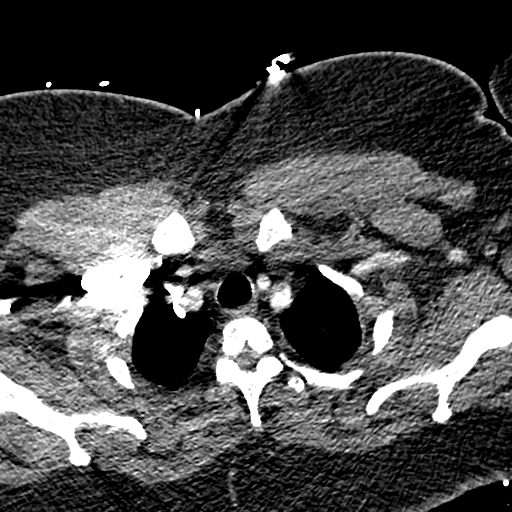
[im 252/266  lung]
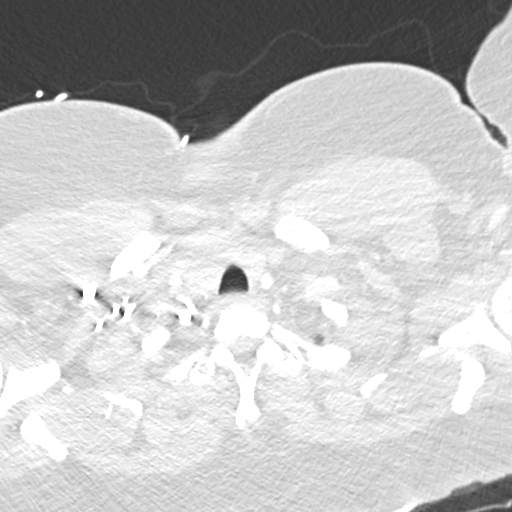

[19 of 31 positions shown; findings below may reference images not displayed]

FINDINGS: The pulmonary arteries are well opacified with contrast.
There is no evidence of acute pulmonary embolism.  There is stable
central enlargement of the pulmonary arteries.

Anterior mediastinal masses measuring 2.2 x 1.7 cm on image 20 and
2.6 x 2.2 cm on image 25 are stable.  There are no enlarged
mediastinal or hilar lymph nodes.  There is a small hiatal hernia.

There is no significant pleural or pericardial effusion.  The left
lower lobe air space disease demonstrated on the most recent
examination has resolved.  There is mild dependent atelectasis at
both lung bases and within the right middle lobe and lingula which
appears stable.

The visualized upper abdomen is notable for hepatomegaly and low
density consistent with steatosis.  No acute osseous findings are
identified.
IMPRESSION: 1.  No evidence of acute pulmonary embolism or other acute chest
process.
2.  Interval resolution of left lower lobe air space disease.
3.  Stable anterior mediastinal masses from 3227, consistent with a
benign etiology.

## 2014-05-26 IMAGING — CR DG CHEST 2V
2 series · 2 of 2 positions shown · non-contrast
Comparison: 06/04/2012.

CLINICAL DATA: Shortness of breath.  Ex-smoker.

CHEST - 2 VIEW

[w chest pa]
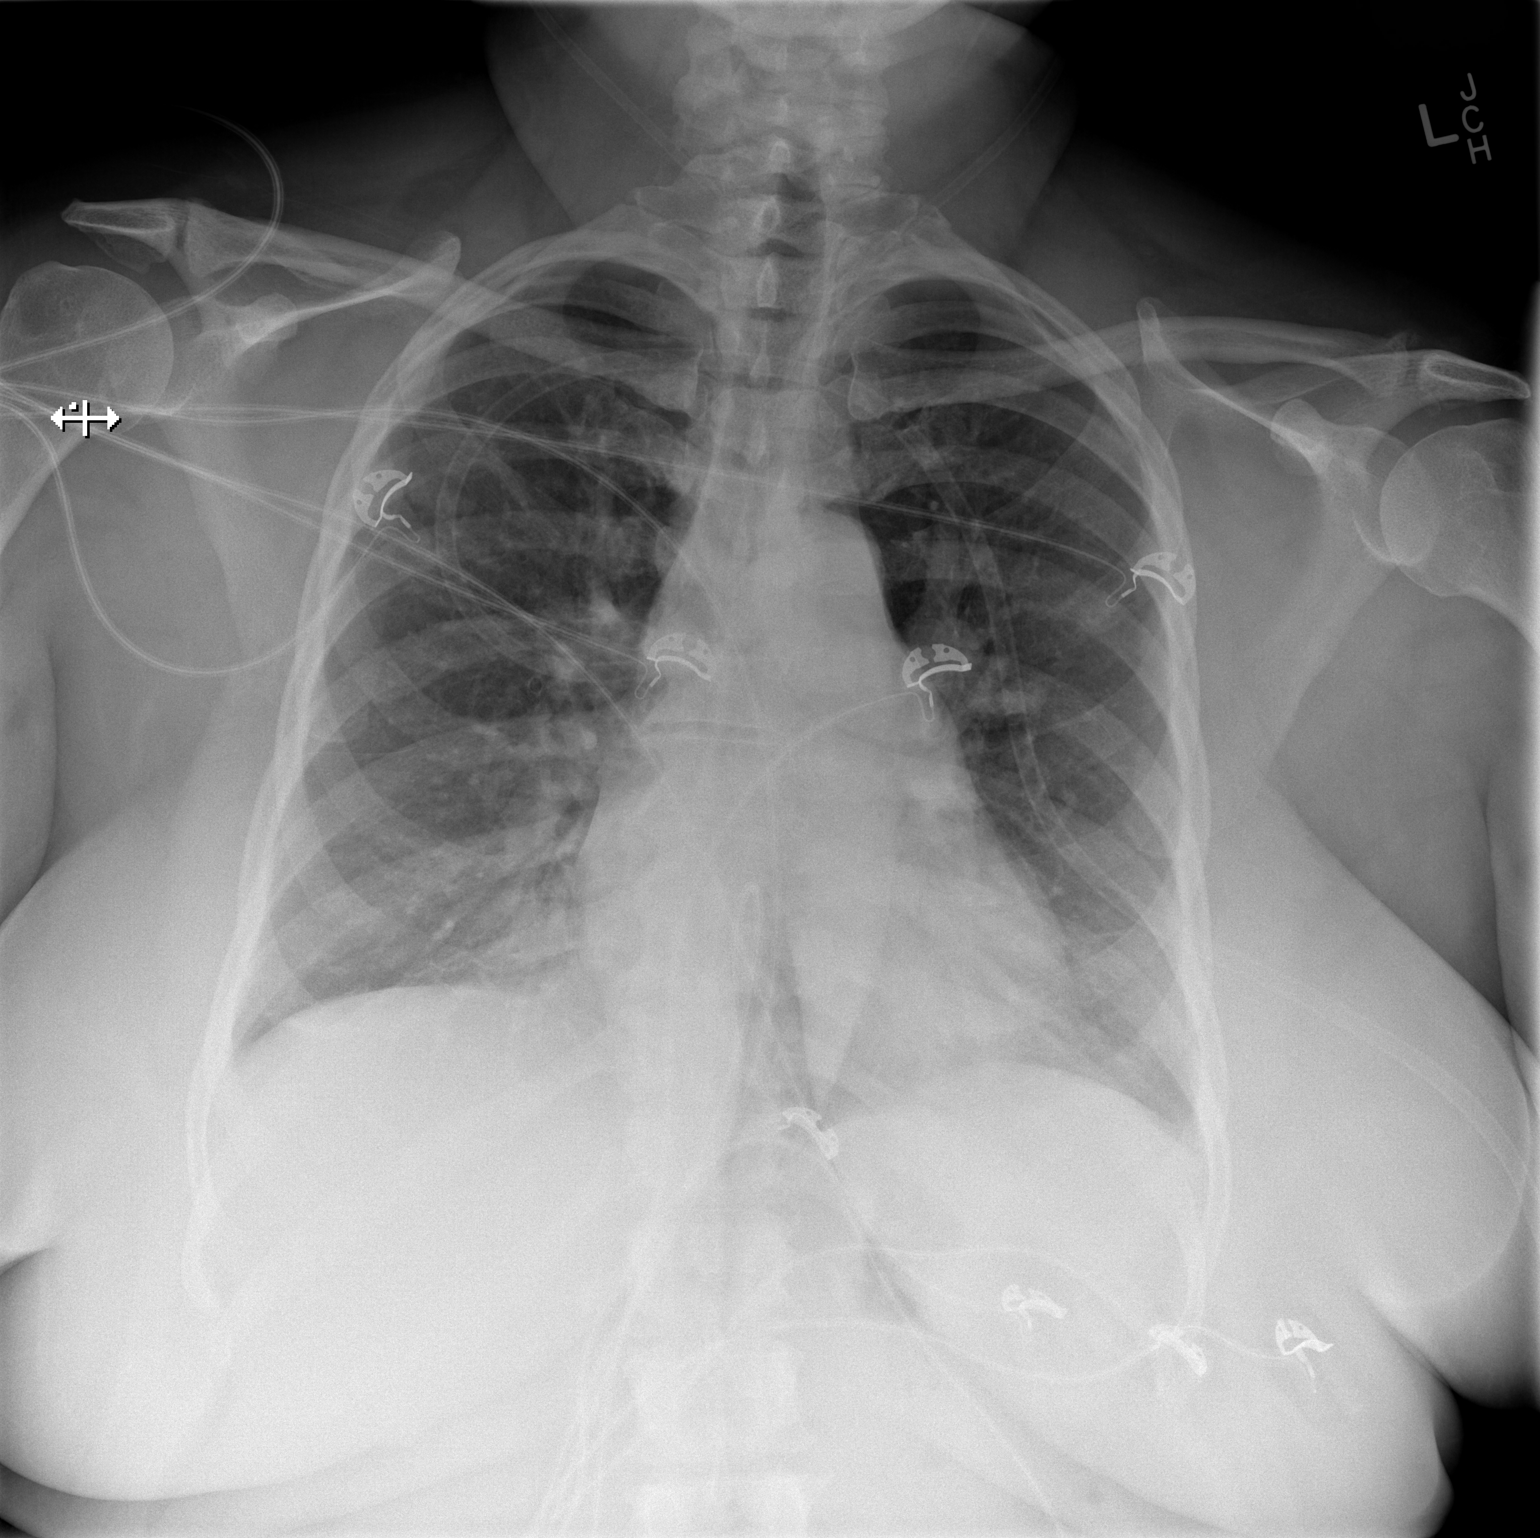

[w chest lat]
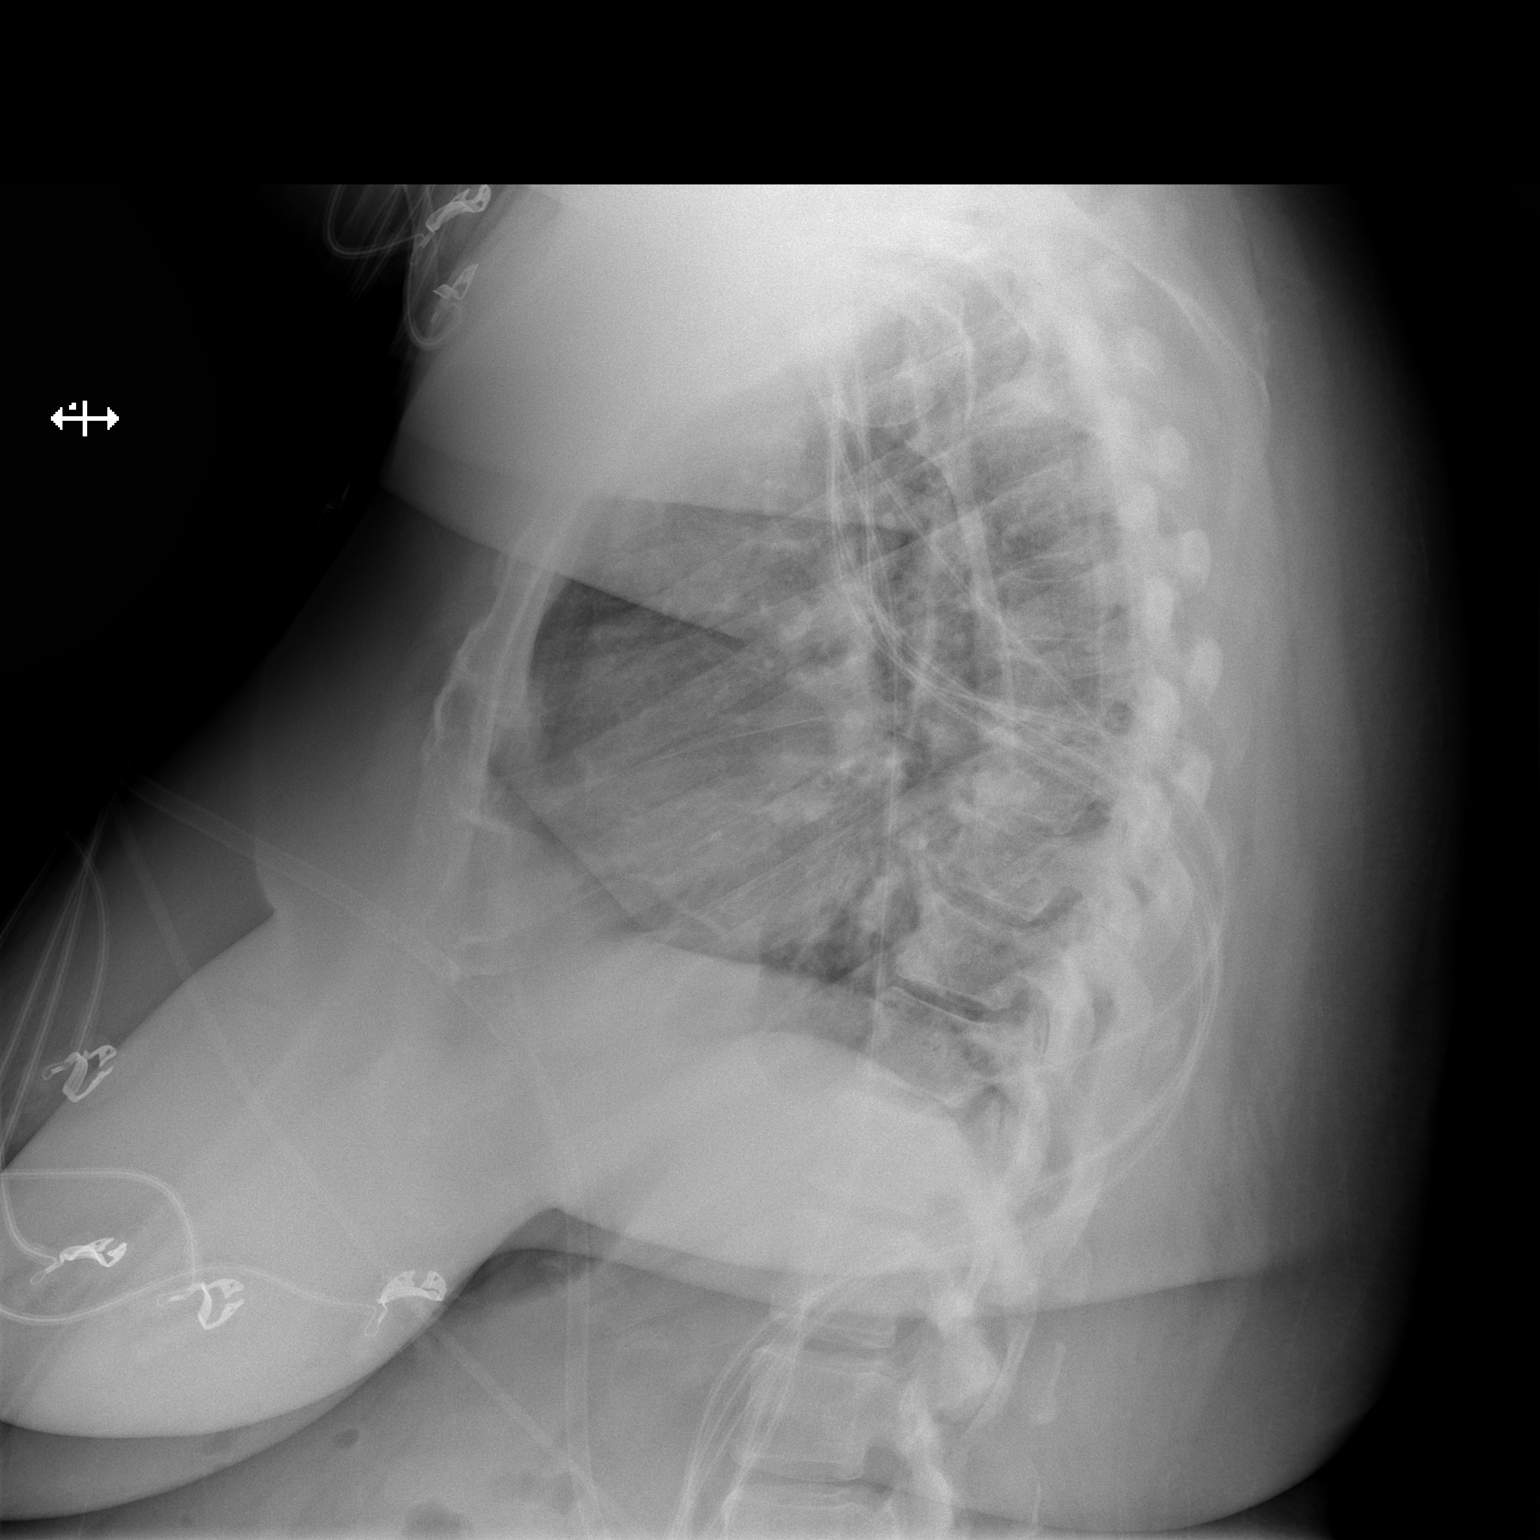

[2 of 2 positions shown; findings below may reference images not displayed]

FINDINGS: Normal sized heart.  Clear lungs.  Mild diffuse
peribronchial thickening.  Lower thoracic spine degenerative
changes and mild scoliosis.
IMPRESSION: Mild chronic bronchitic changes.  No acute abnormality.

## 2014-05-26 MED ORDER — GABAPENTIN 300 MG PO CAPS
600.0000 mg | ORAL_CAPSULE | Freq: Three times a day (TID) | ORAL | Status: DC
  Administered 2014-05-26 – 2014-05-27 (×3): 600 mg via ORAL
  Filled 2014-05-26 (×5): qty 2

## 2014-05-26 MED ORDER — GABAPENTIN 300 MG PO CAPS: 300.0000 mg | ORAL_CAPSULE | Freq: Three times a day (TID) | ORAL | Status: DC

## 2014-05-26 NOTE — Progress Notes (Signed)
Transfer:   Arrival Method:  Wheel chair Mental Orientation: A&Ox4 Telemetry: n/a Assessment: Completed Skin: intact IV: nsl Pain: leg pain 8 out of 10 Tubes:B n/a Safety Measures: Safety Fall Prevention Plan has been given, discussed and signed Admission: Completed [address] [place] Orientation: Patient has been orientated to the room, unit and staff.  Family: daughter at bedside  Orders have been reviewed and implemented. Will continue to monitor the patient. Call light has been placed within reach and bed alarm has been activated.   Dudley Major BSN, Consulting civil engineer number: [phone]

## 2014-05-26 NOTE — Progress Notes (Signed)
UR completed 

## 2014-05-26 NOTE — Progress Notes (Signed)
[facility] PROGRESS NOTE  [name] JIR:[id number] DOB: June 29, 1960 DOA: 05/24/2014 PCP: Velna Hatchet, MD  Assessment/Plan: Chest pain CTangio of chest negative for PE - per Cardiology "normal ECG, Cath 2008 normal, myovue 2014 normal> no further cardiac w/u indicated"   L sided numbness > R sided numbness > bilateral thigh and feet MRI notes no evidence of CVA or other acute findings - B51 / folic acid WNL - migratory nature makes CNS source very unlikely. Will increase home dose neurontin and consult with neurology for further eval and rxcs.  Diarrhea  C diff negative - resolved   Hypokalemia  Repleted.  Chronic diastolic CHF TTE pending for re-evaluation  HTN Currently well controlled   DM CBG currently well controlled  Depression w/ anxiety  Hx of PE 2008 CTangio chest negative for acute PE   Obesity - Body mass index is 43.7 kg/(m^2).   Fatty infiltrate of liver Noted on CTangio of chest - weight loss needed - discussed w/ pt - will need to be followed in outpt setting   OSA  Nightly CPAP - only tolerated for 2 hrs last night   Code Status: Full Family Communication: updated patient, no family present. Disposition Plan: Remain in patient.   Consultants: [facility]   Procedures: 1/31 - carotid dopplers - 1-39% ICA stenosis B TTE - pending MRI head 05/24/14  CT angio chest 05/24/14 CT head 05/24/14  Antibiotics:  None  HPI/Subjective: Patient c/o tingling and numbness in feet and upper thighs.  Objective: Filed Vitals:   05/26/14 1000  BP: 127/76  Pulse: 64  Temp: 98.6 F (37 C)  Resp: 18    Intake/Output Summary (Last 24 hours) at 05/26/14 1128 Last data filed at 05/26/14 0900  Gross per 24 hour  Intake    640 ml  Output    400 ml  Net    240 ml   Filed Weights   05/24/14 0614  Weight: 100.7 kg (222 lb 0.1 oz)    Exam:   General:  NAD  Cardiovascular: RRR  Respiratory: CTAB  Abdomen:  Soft/NT/ND/+BS  Musculoskeletal:No c/c/e  Data Reviewed: Basic Metabolic Panel:  Recent Labs Lab 05/24/14 0237 05/24/14 1420 05/26/14 0610  NA 141  --  140  K 3.3*  --  4.0  CL 104  --  105  CO2 28  --  29  GLUCOSE 119*  --  92  BUN 9  --  10  CREATININE 0.84 0.76 0.81  CALCIUM 9.7  --  8.7   Liver Function Tests:  Recent Labs Lab 05/26/14 0610  AST 19  ALT 32  ALKPHOS 50  BILITOT 0.5  PROT 6.1  ALBUMIN 3.3*   No results for input(s): LIPASE, AMYLASE in the last 168 hours. No results for input(s): AMMONIA in the last 168 hours. CBC:  Recent Labs Lab 05/24/14 0237 05/24/14 1420  WBC 6.3 4.9  HGB 12.7 12.6  HCT 39.9 39.6  MCV 86.4 87.2  PLT 242 211   Cardiac Enzymes:  Recent Labs Lab 05/24/14 0237 05/24/14 0642 05/24/14 1420 05/24/14 1600 05/24/14 1850  TROPONINI 0.03 <0.03 0.03 <0.03 <0.03   BNP (last 3 results)  Recent Labs  05/24/14 0238  BNP 25.2    ProBNP (last 3 results)  Recent Labs  12/01/13 1439 02/24/14 1231  PROBNP 71.8 36.4    CBG:  Recent Labs Lab 05/25/14 0747 05/25/14 1232 05/25/14 1644 05/25/14 2059 05/26/14 0820  GLUCAP 130* 108* 122* 100* 99  Recent Results (from the past 240 hour(s))  Clostridium Difficile by PCR     Status: None   Collection Time: 05/24/14  9:27 AM  Result Value Ref Range Status   C difficile by pcr NEGATIVE NEGATIVE Final  MRSA PCR Screening     Status: None   Collection Time: 05/24/14 12:55 PM  Result Value Ref Range Status   MRSA by PCR NEGATIVE NEGATIVE Final    Comment:        The GeneXpert MRSA Assay (FDA approved for NASAL specimens only), is one component of a comprehensive MRSA colonization surveillance program. It is not intended to diagnose MRSA infection nor to guide or monitor treatment for MRSA infections.      Studies: Ct Angio Chest Pe W/cm &/or Wo Cm  05/24/2014   CLINICAL DATA:  Chest pain radiating into the left arm.  EXAM: CT ANGIOGRAPHY CHEST WITH  CONTRAST  TECHNIQUE: Multidetector CT imaging of the chest was performed using the standard protocol during bolus administration of intravenous contrast. Multiplanar CT image reconstructions and MIPs were obtained to evaluate the vascular anatomy.  CONTRAST:  100 mL OMNIPAQUE IOHEXOL 350 MG/ML SOLN  COMPARISON:  CT chest 02/24/2014.  PA and lateral chest 04/27/2014.  FINDINGS: The study is mildly degraded by respiratory motion. No pulmonary embolus is identified. There is cardiomegaly. The thyroid gland is enlarged and heterogeneous but unchanged in appearance. Cardiomegaly is again seen. No pleural or pericardial effusion. No axillary, hilar or mediastinal lymphadenopathy. The lungs show only mild dependent atelectasis. Visualized upper abdomen demonstrates fatty infiltration of the liver. No focal bony abnormality is identified.  Review of the MIP images confirms the above findings.  IMPRESSION: Negative for pulmonary embolus.  Cardiomegaly.  Fatty infiltration liver.  Thyromegaly, unchanged.   Electronically Signed   By: Inge Rise M.D.   On: 05/24/2014 13:06   Mr Brain Wo Contrast  05/25/2014   CLINICAL DATA:  Acute onset chest pain beginning last night, LEFT chest pain radiating to the shoulder. Vomiting. Numbness and tingling in LEFT arm. History of hypertension, diabetes, CHF, cancer. History of Rathke's cleft cyst.  EXAM: MRI HEAD WITHOUT CONTRAST  TECHNIQUE: Multiplanar, multiecho pulse sequences of the brain and surrounding structures were obtained without intravenous contrast.  COMPARISON:  CT of the head May 24, 2014 at 935 hr, MRI of the pituitary March 16, 2014  FINDINGS: No reduced diffusion to suggest acute ischemia. No susceptibility artifact to suggest hemorrhage.  Ventricles and sulci are normal for age. Scattered subcentimeter supratentorial white matter FLAIR T2 hyperintensities, unchanged though, better seen on today's 3 tesla examination. No midline shift, mass effect.  No  abnormal extra-axial fluid collections. Normal major intracranial vascular flow voids are seen. 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 No paranasal sinus air-fluid levels. Mastoid air cells are well aerated. Ocular globes and orbital contents are unremarkable though not tailored for evaluation. No cerebellar tonsillar ectopia.  IMPRESSION: No acute intracranial process, specifically no acute ischemia.  Mild white matter changes are nonspecific, can be seen with chronic small vessel ischemic disease, stable.  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   Electronically Signed   By: Elon Alas   On: 05/25/2014 01:36    Scheduled Meds: . aspirin EC  325 mg Oral Daily  . atorvastatin  40 mg Oral QHS  . carvedilol  10 mg Oral Daily  . enoxaparin (LOVENOX) injection  40 mg Subcutaneous Q24H  . fluticasone  2  puff Inhalation BID  . furosemide  40 mg Oral Daily  . gabapentin  300 mg Oral BID  . insulin aspart  0-5 Units Subcutaneous QHS  . insulin aspart  0-9 Units Subcutaneous TID WC  . isosorbide dinitrate  20 mg Oral BID  . lisinopril  20 mg Oral Daily  . loratadine  10 mg Oral Daily  . pantoprazole  40 mg Oral Daily  . potassium chloride  20 mEq Oral BID  . sertraline  100 mg Oral Daily  . tiZANidine  2 mg Oral QHS   Continuous Infusions:   Principal Problem:   Chest pain at rest Active Problems:   HYPERCHOLESTEROLEMIA   DOE (dyspnea on exertion)   Chest pain   Diastolic CHF   OSA on CPAP    Time spent: 40 mins    Lifescape MD [facility] Pager [phone]. If 7PM-7AM, please contact night-coverage at [email or web address], password [facility] 05/26/2014, 11:28 AM  LOS: 2 days

## 2014-05-26 NOTE — Progress Notes (Signed)
Echocardiogram 2D Echocardiogram has been performed.  [name] 05/26/2014, 10:51 AM

## 2014-05-26 NOTE — Progress Notes (Signed)
RT filled CPAP with water and placed pt on for the night. Pt tolerating well and instructed to call RT if she had any issues.

## 2014-05-26 NOTE — Progress Notes (Signed)
Please refer to Dr. Johnsie Cancel note yesterday, no further cardiac workup. Pending echo today, cardiology will sign off unless echo result is significantly abnormal. Please call with any question.   Hilbert Corrigan PA Pager: [phone]

## 2014-05-26 NOTE — Consult Note (Signed)
NEURO HOSPITALIST CONSULT NOTE    Reason for Consult: paresthesias arms and legs  HPI:                                                                                                                                          [name] is an 54 y.o. female with a past medical history significant for HTN, DM, chronic CHF, PE, OSA, obesity, depression, and anxiety, admitted to [facility] 1/31 due to chest pain and left sided numbness. Cardiac investigations were unrevealing as well as MRI brain. Patient reports having " numbness of my toes and tip of my fingers in both hands before, but now is a constant numb-icy sensation going to my arms and legs". She tells me that when she came in the numbness was located in her left arm and leg but yesterday morning moved the the right leg and arm also. Stated that she can not feel her feet and hands but denies associated pain, weakness, vertigo, double vision, difficulty swallowing, slurred speech, language or vision impairment. No neck or lower back pain. Bladder and bowel control is intact. Denies recent fever, rash, or infection. On gabapentin.   Past Medical History  Diagnosis Date  . Hypertension   . Diabetes mellitus   . Asthma   . Anginal pain     a. NL cath in 2008;  b. Myoview 03/2011: dec uptake along mid anterior wall on stress imaging -> ? attenuation vs. ischemia, EF 65%;  c. Echo 04/2011: EF 55-60%, no RWMA, Gr 2 dd  . Depression   . Anxiety   . Pulmonary embolism     a. 2008 -> coumadin x 6 mos.  . Sleep apnea   . 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 Obesity   . CHF (congestive heart failure)   . CHF (congestive heart failure)   . Pulmonary edema   . Cancer   . Bone cancer     Past Surgical History  Procedure Laterality Date  . Abdominal hysterectomy  2005  . Leg surgery    . Tubal ligation  1989  . Left knee surgery  2008  . Laparoscopic appendectomy N/A 06/03/2012    Procedure:  APPENDECTOMY LAPAROSCOPIC;  Surgeon: Stark Klein, MD;  Location: [facility] OR;  Service: General;  Laterality: N/A;  . Cardiac catheterization      Normal    Family History  Problem Relation Age of Onset  . Emphysema Mother   . Arthritis Mother   . Heart failure Mother     alive @ 28  . Asthma Brother   . Heart disease Paternal Grandfather   . Heart disease Father     died @ 64's.  . Stroke Father   .  Colon cancer Maternal Grandfather     Family History: no brain tumors, epilepsy, or brain aneurysms.  Social History:  reports that she quit smoking about 29 years ago. Her smoking use included Cigarettes. She has a 7.5 pack-year smoking history. She does not have any smokeless tobacco history on file. She reports that she does not drink alcohol or use illicit drugs.  Allergies  Allergen Reactions  . Hydrocodone Other (See Comments)    Causes Hallucinations  . Penicillins Swelling  . Sulfa Antibiotics Diarrhea, Itching and Rash    MEDICATIONS:                                                                                                                     Scheduled: . aspirin EC  325 mg Oral Daily  . atorvastatin  40 mg Oral QHS  . carvedilol  10 mg Oral Daily  . enoxaparin (LOVENOX) injection  40 mg Subcutaneous Q24H  . fluticasone  2 puff Inhalation BID  . furosemide  40 mg Oral Daily  . gabapentin  600 mg Oral TID  . insulin aspart  0-5 Units Subcutaneous QHS  . insulin aspart  0-9 Units Subcutaneous TID WC  . isosorbide dinitrate  20 mg Oral BID  . lisinopril  20 mg Oral Daily  . loratadine  10 mg Oral Daily  . pantoprazole  40 mg Oral Daily  . potassium chloride  20 mEq Oral BID  . sertraline  100 mg Oral Daily  . tiZANidine  2 mg Oral QHS     ROS:                                                                                                                                       History obtained from the patient and chart review  General ROS: negative for -  chills, fatigue, fever, night sweats, or weight loss Psychological ROS: negative for - behavioral disorder, hallucinations, memory difficulties, mood swings or suicidal ideation Ophthalmic ROS: negative for - blurry vision, double vision, eye pain or loss of vision ENT ROS: negative for - epistaxis, nasal discharge, oral lesions, sore throat, tinnitus or vertigo Allergy and Immunology ROS: negative for - hives or itchy/watery eyes Hematological and Lymphatic ROS: negative for - bleeding problems, bruising or swollen lymph nodes Endocrine ROS: negative for - galactorrhea, hair pattern changes, polydipsia/polyuria or temperature intolerance Respiratory ROS: negative for - cough, hemoptysis,  shortness of breath or wheezing Cardiovascular ROS: negative for - dyspnea on exertion, edema or irregular heartbeat Gastrointestinal ROS: negative for - abdominal pain, diarrhea, hematemesis, nausea/vomiting or stool incontinence Genito-Urinary ROS: negative for - dysuria, hematuria, incontinence or urinary frequency/urgency Musculoskeletal ROS: negative for - joint swelling or muscular weakness Neurological ROS: as noted in HPI Dermatological ROS: negative for rash and skin lesion changes   Physical exam: pleasant female in no apparent distress. Blood pressure 127/76, pulse 64, temperature 98.6 F (37 C), temperature source Oral, resp. rate 18, height 4' 11.75" (1.518 m), weight 100.7 kg (222 lb 0.1 oz), SpO2 98 %. Head: normocephalic. Neck: supple, no bruits, no JVD. Cardiac: no murmurs. Lungs: clear. Abdomen: soft, no tender, no mass. Extremities: no edema. Skin: no rash Neurologic Examination:                                                                                                      General: Mental Status: Alert, oriented, thought content appropriate.  Speech fluent without evidence of aphasia.  Able to follow 3 step commands without difficulty. Cranial Nerves: II: Discs flat  bilaterally; Visual fields grossly normal, pupils equal, round, reactive to light and accommodation III,IV, VI: ptosis not present, extra-ocular motions intact bilaterally V,VII: smile symmetric, facial light touch sensation normal bilaterally VIII: hearing normal bilaterally IX,X: gag reflex present XI: bilateral shoulder shrug XII: midline tongue extension without atrophy or fasciculations  Motor: Right : Upper extremity   5/5    Left:     Upper extremity   5/5  Lower extremity   5/5     Lower extremity   5/5 Tone and bulk:normal tone throughout; no atrophy noted Sensory: Pinprick and light touch diminished arms and legs. Deep Tendon Reflexes:  Right: Upper Extremity   Left: Upper extremity   biceps (C-5 to C-6) 2/4   biceps (C-5 to C-6) 2/4 tricep (C7) 2/4    triceps (C7) 2/4 Brachioradialis (C6) 2/4  Brachioradialis (C6) 2/4  Lower Extremity Lower Extremity  quadriceps (L-2 to L-4) 2/4   quadriceps (L-2 to L-4) 2/4 Achilles (S1) 2/4   Achilles (S1) 2/4 Plantars: Right: downgoing   Left: downgoing Cerebellar: normal finger-to-nose,  normal heel-to-shin test Gait:  No tested    Lab Results  Component Value Date/Time   CHOL 173 04/23/2011 08:30 AM   CHOL 171 04/23/2011 08:30 AM    Results for orders placed or performed during the hospital encounter of 05/24/14 (from the past 48 hour(s))  Troponin I-serum (0, 3, 6 hours)     Status: None   Collection Time: 05/24/14  4:00 PM  Result Value Ref Range   Troponin I <0.03 <0.031 ng/mL    Comment:        NO INDICATION OF MYOCARDIAL INJURY.   Glucose, capillary     Status: None   Collection Time: 05/24/14  4:25 PM  Result Value Ref Range   Glucose-Capillary 87 70 - 99 mg/dL  Troponin I-serum (0, 3, 6 hours)     Status: None   Collection Time: 05/24/14  6:50 PM  Result Value Ref Range   Troponin I <0.03 <0.031 ng/mL    Comment:        NO INDICATION OF MYOCARDIAL INJURY.   Glucose, capillary     Status: None    Collection Time: 05/24/14  9:44 PM  Result Value Ref Range   Glucose-Capillary 84 70 - 99 mg/dL   Comment 1 Capillary Sample   Glucose, capillary     Status: Abnormal   Collection Time: 05/25/14  7:47 AM  Result Value Ref Range   Glucose-Capillary 130 (H) 70 - 99 mg/dL  Glucose, capillary     Status: Abnormal   Collection Time: 05/25/14 12:32 PM  Result Value Ref Range   Glucose-Capillary 108 (H) 70 - 99 mg/dL  Vitamin B12     Status: None   Collection Time: 05/25/14  2:18 PM  Result Value Ref Range   Vitamin B-12 432 211 - 911 pg/mL    Comment: Performed at [facility]  Folate     Status: None   Collection Time: 05/25/14  2:18 PM  Result Value Ref Range   Folate 18.8 ng/mL    Comment: (NOTE) Reference Ranges        Deficient:       0.4 - 3.3 ng/mL        Indeterminate:   3.4 - 5.4 ng/mL        Normal:              > 5.4 ng/mL Performed at [facility]   Glucose, capillary     Status: Abnormal   Collection Time: 05/25/14  4:44 PM  Result Value Ref Range   Glucose-Capillary 122 (H) 70 - 99 mg/dL  Glucose, capillary     Status: Abnormal   Collection Time: 05/25/14  8:59 PM  Result Value Ref Range   Glucose-Capillary 100 (H) 70 - 99 mg/dL  Comprehensive metabolic panel     Status: Abnormal   Collection Time: 05/26/14  6:10 AM  Result Value Ref Range   Sodium 140 135 - 145 mmol/L   Potassium 4.0 3.5 - 5.1 mmol/L   Chloride 105 96 - 112 mmol/L   CO2 29 19 - 32 mmol/L   Glucose, Bld 92 70 - 99 mg/dL   BUN 10 6 - 23 mg/dL   Creatinine, Ser 0.81 0.50 - 1.10 mg/dL   Calcium 8.7 8.4 - 10.5 mg/dL   Total Protein 6.1 6.0 - 8.3 g/dL   Albumin 3.3 (L) 3.5 - 5.2 g/dL   AST 19 0 - 37 U/L   ALT 32 0 - 35 U/L   Alkaline Phosphatase 50 39 - 117 U/L   Total Bilirubin 0.5 0.3 - 1.2 mg/dL   GFR calc non Af Amer 81 (L) >90 mL/min   GFR calc Af Amer >90 >90 mL/min    Comment: (NOTE) The eGFR has been calculated using the CKD EPI equation. This calculation has not  been validated in all clinical situations. eGFR's persistently <90 mL/min signify possible Chronic Kidney Disease.    Anion gap 6 5 - 15  Glucose, capillary     Status: None   Collection Time: 05/26/14  8:20 AM  Result Value Ref Range   Glucose-Capillary 99 70 - 99 mg/dL  Glucose, capillary     Status: None   Collection Time: 05/26/14 12:12 PM  Result Value Ref Range   Glucose-Capillary 92 70 - 99 mg/dL    Mr Brain Wo Contrast  05/25/2014   CLINICAL  DATA:  Acute onset chest pain beginning last night, LEFT chest pain radiating to the shoulder. Vomiting. Numbness and tingling in LEFT arm. History of hypertension, diabetes, CHF, cancer. History of Rathke's cleft cyst.  EXAM: MRI HEAD WITHOUT CONTRAST  TECHNIQUE: Multiplanar, multiecho pulse sequences of the brain and surrounding structures were obtained without intravenous contrast.  COMPARISON:  CT of the head May 24, 2014 at 935 hr, MRI of the pituitary March 16, 2014  FINDINGS: No reduced diffusion to suggest acute ischemia. No susceptibility artifact to suggest hemorrhage.  Ventricles and sulci are normal for age. Scattered subcentimeter supratentorial white matter FLAIR T2 hyperintensities, unchanged though, better seen on today's 3 tesla examination. No midline shift, mass effect.  No abnormal extra-axial fluid collections. Normal major intracranial vascular flow voids are seen. 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 No paranasal sinus air-fluid levels. Mastoid air cells are well aerated. Ocular globes and orbital contents are unremarkable though not tailored for evaluation. No cerebellar tonsillar ectopia.  IMPRESSION: No acute intracranial process, specifically no acute ischemia.  Mild white matter changes are nonspecific, can be seen with chronic small vessel ischemic disease, stable.  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   Electronically Signed   By:  Elon Alas   On: 05/25/2014 01:36   Assessment/Plan: 54 y/o with paresthesias arms and legs that initially developed left arm-leg and yesterday moved to the right arm-leg. Of note, she reports prior numbness of toes and fingertips. Neuro-exam is not particularly impressive, and her subjective sensory deficits at times are not quite consistent. MRI brain unremarkable. The overall pattern of patient's sensory complains is rather perplexing, DTR's are present and for the most part her neuro-exam is non localizing. I would recommend MRI cervical spine but doubt intrinsic cord disease. If MRI negative, no further inpatient neurological testing recommended and will benefit of NCS/EMG as outpatient. Will follow up  Dorian Pod, MD 05/26/2014, 2:51 PM  [facility]

## 2014-05-27 ENCOUNTER — Inpatient Hospital Stay: Admission: RE | Admit: 2014-05-27 | Payer: Medicaid Other | Source: Ambulatory Visit

## 2014-05-27 DIAGNOSIS — R079 Chest pain, unspecified: Secondary | ICD-10-CM | POA: Diagnosis not present

## 2014-05-27 LAB — CBC
HCT: 43 % (ref 36.0–46.0)
Hemoglobin: 13.4 g/dL (ref 12.0–15.0)
MCH: 28 pg (ref 26.0–34.0)
MCHC: 31.2 g/dL (ref 30.0–36.0)
MCV: 89.8 fL (ref 78.0–100.0)
Platelets: 254 10*3/uL (ref 150–400)
RBC: 4.79 MIL/uL (ref 3.87–5.11)
RDW: 13.4 % (ref 11.5–15.5)
WBC: 4.7 10*3/uL (ref 4.0–10.5)

## 2014-05-27 LAB — BASIC METABOLIC PANEL
Anion gap: 5 (ref 5–15)
BUN: 10 mg/dL (ref 6–23)
CO2: 32 mmol/L (ref 19–32)
Calcium: 9.3 mg/dL (ref 8.4–10.5)
Chloride: 104 mmol/L (ref 96–112)
Creatinine, Ser: 0.81 mg/dL (ref 0.50–1.10)
GFR calc Af Amer: >90 mL/min (ref >90)
GFR calc non Af Amer: 81 mL/min — ABNORMAL LOW (ref >90)
Glucose, Bld: 109 mg/dL — ABNORMAL HIGH (ref 70–99)
Potassium: 4.1 mmol/L (ref 3.5–5.1)
Sodium: 141 mmol/L (ref 135–145)

## 2014-05-27 LAB — GLUCOSE, CAPILLARY
Glucose-Capillary: 100 mg/dL — ABNORMAL HIGH (ref 70–99)
Glucose-Capillary: 130 mg/dL — ABNORMAL HIGH (ref 70–99)
Glucose-Capillary: 119 mg/dL — ABNORMAL HIGH (ref 70–99)

## 2014-05-27 MED ORDER — ASPIRIN 325 MG PO TBEC: 325.0000 mg | DELAYED_RELEASE_TABLET | Freq: Every day | ORAL | Status: DC

## 2014-05-27 MED ORDER — GABAPENTIN 300 MG PO CAPS: 600.0000 mg | ORAL_CAPSULE | Freq: Three times a day (TID) | ORAL | Status: DC

## 2014-05-27 MED ORDER — PANTOPRAZOLE SODIUM 40 MG PO TBEC: 40.0000 mg | DELAYED_RELEASE_TABLET | Freq: Every day | ORAL | Status: DC

## 2014-05-27 NOTE — Care Management Note (Signed)
CARE MANAGEMENT NOTE 05/27/2014  Patient:  [name], [name]   Account Number:  [id number]  Date Initiated:  05/27/2014  Documentation initiated by:  Abdishakur Gottschall  Subjective/Objective Assessment:   CM following for progression and d/c planning.     Action/Plan:   Met with pt IM given no DME needs, not orders at this time.   Anticipated DC Date:  05/27/2014   Anticipated DC Plan:  HOME/SELF CARE         Choice offered to / List presented to:             Status of service:  Completed, signed off Medicare Important Message given?  YES (If response is "NO", the following Medicare IM given date fields will be blank) Date Medicare IM given:  05/27/2014 Medicare IM given by:  Haddon Fyfe Date Additional Medicare IM given:   Additional Medicare IM given by:    Discharge Disposition:  HOME/SELF CARE  Per UR Regulation:    If discussed at Long Length of Stay Meetings, dates discussed:    Comments:

## 2014-05-27 NOTE — Progress Notes (Signed)
Physical Therapy Treatment Patient Details Name: [name] MRN: [id number] DOB: 12/26/1960 Today's Date: 05/27/2014    History of Present Illness Pt adm with chest pain and left arm numbness. Cardiac source ruled out. MRI negative. PMH - heart failure, DM, HTN, depression/anxiety, obesity    PT Comments    Pt progressing towards physical therapy goals. Pt did report that as she walked, her pain lessened in her feet, however began to feel pain in her shoulders. Pt overall states that she feels comfortable returning home today, and has a walker that is in storage she needs her family to retrieve for her. Recommending HHPT to follow at home. Will continue to follow.   Follow Up Recommendations  Home health PT;Supervision for mobility/OOB     Equipment Recommendations  None recommended by PT    Recommendations for Other Services       Precautions / Restrictions Precautions Precautions: Fall Restrictions Weight Bearing Restrictions: No    Mobility  Bed Mobility Overal bed mobility: Modified Independent Bed Mobility: Supine to Sit           General bed mobility comments: Incr time and effort; no physical assist required.   Transfers Overall transfer level: Needs assistance Equipment used: Rolling walker (2 wheeled) Transfers: Sit to/from Stand Sit to Stand: Supervision         General transfer comment: Incr time and effort but no physical assist needed.  Ambulation/Gait Ambulation/Gait assistance: Min guard Ambulation Distance (Feet): 80 Feet Assistive device: Rolling walker (2 wheeled) Gait Pattern/deviations: Step-through pattern;Decreased stride length;Narrow base of support;Trunk flexed Gait velocity: decr Gait velocity interpretation: Below normal speed for age/gender General Gait Details: Pt slow gait and fatigued quickly but no loss of balance. It was during gait training that pt began to complain of pain in her shoulders - possibly due to leaning on  the walker.   Stairs            Wheelchair Mobility    Modified Rankin (Stroke Patients Only)       Balance Overall balance assessment: Needs assistance Sitting-balance support: Feet supported;No upper extremity supported Sitting balance-Leahy Scale: Normal     Standing balance support: No upper extremity supported;During functional activity Standing balance-Leahy Scale: Fair                      Cognition Arousal/Alertness: Awake/alert Behavior During Therapy: WFL for tasks assessed/performed Overall Cognitive Status: Within Functional Limits for tasks assessed                      Exercises      General Comments        Pertinent Vitals/Pain Pain Assessment: Faces Faces Pain Scale: Hurts little more Pain Location: Pt reports numbness/pain in bilaterally feet, lateral legs from ankle to hip, and bilateral shoulders. Pain Descriptors / Indicators: Burning Pain Intervention(s): Limited activity within patient's tolerance;Monitored during session;Heat applied    Home Living                      Prior Function            PT Goals (current goals can now be found in the care plan section) Acute Rehab PT Goals Patient Stated Goal: return home PT Goal Formulation: With patient Time For Goal Achievement: 06/01/14 Potential to Achieve Goals: Good Progress towards PT goals: Progressing toward goals    Frequency  Min 3X/week    PT Plan Current plan  remains appropriate    Co-evaluation             End of Session Equipment Utilized During Treatment: Gait belt Activity Tolerance: Patient limited by fatigue;Patient limited by pain Patient left: in chair;with call bell/phone within reach     Time: 0174-9449 PT Time Calculation (min) (ACUTE ONLY): 28 min  Charges:  $Gait Training: 8-22 mins $Therapeutic Activity: 8-22 mins                    G Codes:      Rolinda Roan 2014-06-18, 12:14 PM  Rolinda Roan, PT, DPT Acute  Rehabilitation Services Pager: [phone]

## 2014-05-27 NOTE — Progress Notes (Signed)
NEURO HOSPITALIST PROGRESS NOTE   SUBJECTIVE:                                                                                                                        Stated that she is feeling better today and can feel her feet. MRI cervical spine showed multilevel degenerative disc disease without significant spinal canal stenosis. Multifactorial degenerative changes with resultant moderate tosevere foraminal stenosis on the left at C4-5 and C5-6, and on the right at C6-7. Overall, these changes are similar relative to most recent MRI from 01/09/2013. No cord involvement.  OBJECTIVE:                                                                                                                           Vital signs in last 24 hours: Temp:  [98 F (36.7 C)-98.6 F (37 C)] 98 F (36.7 C) (02/03 0550) Pulse Rate:  [64-75] 67 (02/03 0550) Resp:  [17-18] 17 (02/03 0550) BP: (118-144)/(63-76) 118/63 mmHg (02/03 0550) SpO2:  [96 %-99 %] 99 % (02/03 0550) Weight:  [102.3 kg (225 lb 8.5 oz)] 102.3 kg (225 lb 8.5 oz) (02/02 2207)  Intake/Output from previous day: 02/02 0701 - 02/03 0700 In: 680 [P.O.:680] Out: 900 [Urine:900] Intake/Output this shift:   Nutritional status: Diet Heart  Past Medical History  Diagnosis Date  . Hypertension   . Diabetes mellitus   . Asthma   . Anginal pain     a. NL cath in 2008;  b. Myoview 03/2011: dec uptake along mid anterior wall on stress imaging -> ? attenuation vs. ischemia, EF 65%;  c. Echo 04/2011: EF 55-60%, no RWMA, Gr 2 dd  . Depression   . Anxiety   . Pulmonary embolism     a. 2008 -> coumadin x 6 mos.  . Sleep apnea   . 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 Obesity   . CHF (congestive heart failure)   . CHF (congestive heart failure)   . Pulmonary edema   . Cancer   . Bone cancer     Physical exam: pleasant female in no apparent distress. Head: normocephalic. Neck: supple,  no bruits, no JVD. Cardiac: no  murmurs. Lungs: clear. Abdomen: soft, no tender, no mass. Extremities: no edema. Skin: no rash   Neurologic Exam:  General: Mental Status: Alert, oriented, thought content appropriate. Speech fluent without evidence of aphasia. Able to follow 3 step commands without difficulty. Cranial Nerves: II: Discs flat bilaterally; Visual fields grossly normal, pupils equal, round, reactive to light and accommodation III,IV, VI: ptosis not present, extra-ocular motions intact bilaterally V,VII: smile symmetric, facial light touch sensation normal bilaterally VIII: hearing normal bilaterally IX,X: gag reflex present XI: bilateral shoulder shrug XII: midline tongue extension without atrophy or fasciculations  Motor: Right :Upper extremity 5/5Left: Upper extremity 5/5 Lower extremity 5/5Lower extremity 5/5 Tone and bulk:normal tone throughout; no atrophy noted Sensory: Pinprick and light touch diminished arms and legs. Deep Tendon Reflexes:  Right: Upper Extremity Left: Upper extremity   biceps (C-5 to C-6) 2/4 biceps (C-5 to C-6) 2/4 tricep (C7) 2/4triceps (C7) 2/4 Brachioradialis (C6) 2/4Brachioradialis (C6) 2/4  Lower Extremity Lower Extremity  quadriceps (L-2 to L-4) 2/4 quadriceps (L-2 to L-4) 2/4 Achilles (S1) 2/4Achilles (S1) 2/4 Plantars: Right: downgoingLeft: downgoing Cerebellar: normal finger-to-nose, normal heel-to-shin test Gait:  No tested  Lab Results: Lab Results  Component Value Date/Time   CHOL 173 04/23/2011 08:30 AM   CHOL 171 04/23/2011 08:30 AM   Lipid Panel No results for input(s): CHOL, TRIG, HDL, CHOLHDL, VLDL, LDLCALC in the  last 72 hours.  Studies/Results: Mr Cervical Spine Wo Contrast  05/26/2014   CLINICAL DATA:  Initial evaluation for upper and lower extremity paresthesias.  EXAM: MRI CERVICAL SPINE WITHOUT CONTRAST  TECHNIQUE: Multiplanar, multisequence MR imaging of the cervical spine was performed. No intravenous contrast was administered.  COMPARISON:  None.  FINDINGS: 12 mm cystic lesion noted within the pituitary gland, better evaluated on recent MRI of the brain. Visualized brain and posterior fossa are are otherwise unremarkable. Craniocervical junction widely patent.  There is reversal of the normal cervical lordosis with apex at C4-5. Trace anterolisthesis of C4 on C5 present. Overall, alignment is stable from previous MRI from 2014. Vertebral body heights are maintained. Signal intensity within the vertebral body bone marrow is normal. No focal osseous lesion.  Paraspinous and prevertebral soft tissues are within normal limits. Normal intravascular flow voids present within the vertebral arteries bilaterally.  Signal intensity within the cervical spinal cord is normal.  C2-3: Mild bilateral uncovertebral spurring present without significant stenosis.  C3-4: Mild bilateral uncovertebral spurring without significant stenosis.  C4-5: Left eccentric circumferential degenerative disc osteophyte complex with left-sided uncovertebral spurring and mild left facet hypertrophy. There is resultant moderate to severe left foraminal narrowing. Mild effacement of the left ventral thecal sac present without significant canal stenosis. Mild right foraminal narrowing present as well.  C5-6: Left eccentric degenerative disc osteophyte with uncovertebral hypertrophy and facet arthrosis. There is fairly severe left sided foraminal narrowing. Mild to moderate right foraminal stenosis present as well. No central canal stenosis.  C6-7: Mild diffuse degenerative disc osteophyte complex, a centric to the right, with right-sided uncovertebral  spurring and hypertrophy. There is moderate to severe right foraminal stenosis with mild left foraminal narrowing. No significant central canal stenosis.  C7-T1: Mild left-sided facet hypertrophy present. No significant canal or foraminal stenosis.  Visualized upper thoracic spine within normal limits.  IMPRESSION: 1. Multilevel degenerative disc disease without significant spinal canal stenosis. 2. Multifactorial degenerative changes with resultant moderate to severe foraminal stenosis on the left at C4-5 and C5-6, and on the right at C6-7. Overall, these changes are similar relative  to most recent MRI from 01/09/2013. 3. Stable cystic pituitary lesion, better evaluated on recent brain MRI.   Electronically Signed   By: Jeannine Boga M.D.   On: 05/26/2014 21:48    MEDICATIONS                                                                                                                        Scheduled: . aspirin EC  325 mg Oral Daily  . atorvastatin  40 mg Oral QHS  . carvedilol  10 mg Oral Daily  . enoxaparin (LOVENOX) injection  40 mg Subcutaneous Q24H  . fluticasone  2 puff Inhalation BID  . furosemide  40 mg Oral Daily  . gabapentin  600 mg Oral TID  . insulin aspart  0-5 Units Subcutaneous QHS  . insulin aspart  0-9 Units Subcutaneous TID WC  . isosorbide dinitrate  20 mg Oral BID  . lisinopril  20 mg Oral Daily  . loratadine  10 mg Oral Daily  . pantoprazole  40 mg Oral Daily  . potassium chloride  20 mEq Oral BID  . sertraline  100 mg Oral Daily  . tiZANidine  2 mg Oral QHS    ASSESSMENT/PLAN:                                                                                                            54 y/o with paresthesias arms and legs that initially developed left arm-leg and yesterday moved to the right arm-leg. Of note, she reports prior numbness of toes and fingertips. Neuro-exam is not particularly impressive, and her subjective sensory deficits at times are not quite  consistent. MRI brain and cervical spine without intracranial or cord abnormalities. Serologies are unremarkable. Perplexing pattern and evolution of paresthesias. No further inpatient neurological testing required. She said that she is improving. Will suggest NCS/EMG as outpatient if her paresthesias persist.  Dorian Pod, MD [facility] [phone]  05/27/2014, 7:51 AM
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
## 2014-05-28 NOTE — Care Management Note (Signed)
CARE MANAGEMENT NOTE 05/28/2014  Patient:  [name], [name]   Account Number:  [id number]  Date Initiated:  05/27/2014  Documentation initiated by:  Damarrion Mimbs  Subjective/Objective Assessment:   CM following for progression and d/c planning.     Action/Plan:   Met with pt IM given no DME needs, not orders at this time.  05/28/2014 Noted late orders for this pt, spoke with pt this am, she selected [facility] for [facility].   Anticipated DC Date:  05/27/2014   Anticipated DC Plan:  [place]         Choice offered to / List presented to:          [facility] arranged  [place].   Status of service:  Completed, signed off Medicare Important Message given?  YES (If response is "NO", the following Medicare IM given date fields will be blank) Date Medicare IM given:  05/27/2014 Medicare IM given by:  Kiven Vangilder Date Additional Medicare IM given:   Additional Medicare IM given by:    Discharge Disposition:  HOME/SELF CARE  Per UR Regulation:    If discussed at Long Length of Stay Meetings, dates discussed:    Comments:

## 2014-05-28 NOTE — Care Management Note (Signed)
Late Entry:  CARE MANAGEMENT NOTE 05/28/2014  Patient:  [name], [name]   Account Number:  [id number]  Date Initiated:  05/27/2014  Documentation initiated by:  Landree Fernholz  Subjective/Objective Assessment:   CM following for progression and d/c planning.     Action/Plan:   Met with pt IM given no DME needs, not orders at this time.  05/28/2014 Noted late orders for this pt, spoke with pt this am, she selected [facility] for [facility].   Anticipated DC Date:  05/27/2014   Anticipated DC Plan:  [place]         Choice offered to / List presented to:          [facility] arranged  [place].   Status of service:  Completed, signed off Medicare Important Message given?  YES (If response is "NO", the following Medicare IM given date fields will be blank) Date Medicare IM given:  05/27/2014 Medicare IM given by:  Elysse Polidore Date Additional Medicare IM given:   Additional Medicare IM given by:    Discharge Disposition:  HOME/SELF CARE  Per UR Regulation:    If discussed at Long Length of Stay Meetings, dates discussed:    Comments:

## 2014-05-29 NOTE — Progress Notes (Signed)
PT Late G code entry    05/25/14 1302  PT G-Codes **NOT FOR INPATIENT CLASS**  Functional Assessment Tool Used clinical judgement  Functional Limitation Mobility: Walking and moving around  Mobility: Walking and Moving Around Current Status [phone]) CI  Mobility: Walking and Moving Around Goal Status [phone]) [place]

## 2014-06-16 ENCOUNTER — Ambulatory Visit
Admission: RE | Admit: 2014-06-16 | Discharge: 2014-06-16 | Disposition: A | Discharge status: Home / Self Care | Payer: Medicare Other | Source: Ambulatory Visit | Attending: Internal Medicine | Admitting: Internal Medicine

## 2014-06-16 ENCOUNTER — Other Ambulatory Visit (HOSPITAL_COMMUNITY)
Admission: RE | Admit: 2014-06-16 | Discharge: 2014-06-16 | Disposition: A | Discharge status: Home / Self Care | Payer: Medicare Other | Source: Ambulatory Visit | Attending: Interventional Radiology | Admitting: Interventional Radiology

## 2014-06-16 DIAGNOSIS — E041 Nontoxic single thyroid nodule: Secondary | ICD-10-CM | POA: Diagnosis present

## 2014-06-18 ENCOUNTER — Ambulatory Visit (INDEPENDENT_AMBULATORY_CARE_PROVIDER_SITE_OTHER): Payer: Medicare Other | Admitting: Neurology

## 2014-06-18 ENCOUNTER — Encounter: Payer: Self-pay | Admitting: Neurology

## 2014-06-18 VITALS — BP 136/82 | HR 70 | Resp 14 | Ht 59.0 in | Wt 226.0 lb

## 2014-06-18 DIAGNOSIS — R202 Paresthesia of skin: Secondary | ICD-10-CM | POA: Diagnosis not present

## 2014-06-18 DIAGNOSIS — R5383 Other fatigue: Secondary | ICD-10-CM | POA: Diagnosis not present

## 2014-06-18 DIAGNOSIS — R5381 Other malaise: Secondary | ICD-10-CM

## 2014-06-18 NOTE — Patient Instructions (Signed)
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
## 2014-06-19 LAB — SPECIMEN STATUS REPORT

## 2014-06-19 LAB — NMO IGG AUTOANTIBODIES: NMO-IgG: <1.5 U/mL (ref 0.0–3.0)

## 2014-06-22 LAB — IFE AND PE, SERUM
Albumin SerPl Elph-Mcnc: 3.7 g/dL (ref 3.2–5.6)
Albumin/Glob SerPl: 1.3 (ref 0.7–2.0)
Alpha 1: 0.2 g/dL (ref 0.1–0.4)
Alpha2 Glob SerPl Elph-Mcnc: 0.7 g/dL (ref 0.4–1.2)
B-Globulin SerPl Elph-Mcnc: 1.2 g/dL (ref 0.6–1.3)
Gamma Glob SerPl Elph-Mcnc: 1 g/dL (ref 0.5–1.6)
Globulin, Total: 3 g/dL (ref 2.0–4.5)
IgA/Immunoglobulin A, Serum: 290 mg/dL (ref 91–414)
IgG (Immunoglobin G), Serum: 906 mg/dL (ref 700–1600)
IgM (Immunoglobulin M), Srm: 126 mg/dL (ref 40–230)
Total Protein: 6.7 g/dL (ref 6.0–8.5)

## 2014-06-25 ENCOUNTER — Other Ambulatory Visit: Payer: Self-pay | Admitting: Neurology

## 2014-06-25 ENCOUNTER — Telehealth: Payer: Self-pay | Admitting: Neurology

## 2014-06-25 DIAGNOSIS — G4733 Obstructive sleep apnea (adult) (pediatric): Secondary | ICD-10-CM

## 2014-06-25 LAB — MULTIPLE MYELOMA PANEL, SERUM
Albumin SerPl Elph-Mcnc: 3.7 g/dL (ref 3.2–5.6)
Albumin/Glob SerPl: 1.3 (ref 0.7–2.0)
Alpha 1: 0.2 g/dL (ref 0.1–0.4)
Alpha2 Glob SerPl Elph-Mcnc: 0.7 g/dL (ref 0.4–1.2)
B-Globulin SerPl Elph-Mcnc: 1.2 g/dL (ref 0.6–1.3)
Gamma Glob SerPl Elph-Mcnc: 1 g/dL (ref 0.5–1.6)
Globulin, Total: 3 g/dL (ref 2.0–4.5)
IgA/Immunoglobulin A, Serum: 290 mg/dL (ref 91–414)
IgG (Immunoglobin G), Serum: 906 mg/dL (ref 700–1600)
IgM (Immunoglobulin M), Srm: 126 mg/dL (ref 40–230)
Total Protein: 6.7 g/dL (ref 6.0–8.5)

## 2014-06-25 LAB — RHEUMATOID FACTOR: Rhuematoid fact SerPl-aCnc: 11 IU/mL (ref 0.0–13.9)

## 2014-06-25 LAB — ANGIOTENSIN CONVERTING ENZYME: Angio Convert Enzyme: <14 U/L — ABNORMAL LOW (ref 14–82)

## 2014-06-25 LAB — TSH: TSH: 0.345 u[IU]/mL — ABNORMAL LOW (ref 0.450–4.500)

## 2014-06-25 LAB — RPR: RPR Ser Ql: NONREACTIVE

## 2014-06-25 LAB — NEUROMYELITIS OPTICA AUTOAB, IGG: NMO-IgG: <1.5 U/mL (ref 0.0–3.0)

## 2014-06-25 LAB — B. BURGDORFI ANTIBODIES: Lyme IgG/IgM Ab: <0.91 {ISR} (ref 0.00–0.90)

## 2014-06-25 LAB — COPPER, SERUM: Copper: 121 ug/dL (ref 72–166)

## 2014-06-25 LAB — ANA W/REFLEX: Anti Nuclear Antibody(ANA): NEGATIVE

## 2014-06-25 LAB — SEDIMENTATION RATE: Sed Rate: 20 mm/hr (ref 0–40)

## 2014-06-25 LAB — HIV ANTIBODY (ROUTINE TESTING W REFLEX): HIV Screen 4th Generation wRfx: NONREACTIVE

## 2014-06-25 NOTE — Telephone Encounter (Signed)
I called the patient. The blood work was unremarkable with exception that the TSH was slightly low low, but it has been low for least 2 years. No other significant abnormalities seen. The patient will follow-up for EMG and nerve conduction study evaluation.

## 2014-06-30 ENCOUNTER — Ambulatory Visit (INDEPENDENT_AMBULATORY_CARE_PROVIDER_SITE_OTHER): Payer: Medicare Other | Admitting: Neurology

## 2014-06-30 ENCOUNTER — Encounter: Payer: Self-pay | Admitting: Neurology

## 2014-06-30 ENCOUNTER — Ambulatory Visit (INDEPENDENT_AMBULATORY_CARE_PROVIDER_SITE_OTHER): Payer: Self-pay | Admitting: Neurology

## 2014-06-30 DIAGNOSIS — R5381 Other malaise: Secondary | ICD-10-CM

## 2014-06-30 DIAGNOSIS — R200 Anesthesia of skin: Secondary | ICD-10-CM

## 2014-06-30 DIAGNOSIS — R202 Paresthesia of skin: Secondary | ICD-10-CM | POA: Diagnosis not present

## 2014-06-30 DIAGNOSIS — R5383 Other fatigue: Secondary | ICD-10-CM

## 2014-06-30 NOTE — Procedures (Signed)
     HISTORY:  [name] is a 54 year old patient with a history of sensory alteration on all 4 extremities, including the left face. The patient has the sensation of water running down the legs. She is being evaluated for a possible neuropathy as an etiology of her current symptoms.  NERVE CONDUCTION STUDIES:  Nerve conduction studies were performed on both upper extremities. The distal motor latencies and motor amplitudes for the median and ulnar nerves were within normal limits. The F wave latencies and nerve conduction velocities for these nerves were also normal. The sensory latencies for the median, radial, and ulnar nerves were normal.  Nerve conduction studies were performed on both lower extremities. The distal motor latencies and motor amplitudes for the peroneal and posterior tibial nerves were within normal limits. The nerve conduction velocities for these nerves were also normal. The F wave latencies were normal for the peroneal and posterior tibial nerves bilaterally. The sensory latencies for the peroneal nerves were within normal limits.   EMG STUDIES:  EMG study was performed on the left lower extremity:  The tibialis anterior muscle reveals 2 to 4K motor units with full recruitment. No fibrillations or positive waves were seen. The peroneus tertius muscle reveals 2 to 4K motor units with full recruitment. No fibrillations or positive waves were seen. The medial gastrocnemius muscle reveals 1 to 3K motor units with full recruitment. No fibrillations or positive waves were seen. The vastus lateralis muscle reveals 2 to 4K motor units with full recruitment. No fibrillations or positive waves were seen. The iliopsoas muscle reveals 2 to 4K motor units with full recruitment. No fibrillations or positive waves were seen. The biceps femoris muscle (long head) reveals 2 to 4K motor units with full recruitment. No fibrillations or positive waves were seen. The lumbosacral  paraspinal muscles were tested at 3 levels, and revealed no abnormalities of insertional activity at all 3 levels tested. There was good relaxation.   IMPRESSION:  Nerve conduction studies done on all 4 extremities were within normal limits. No evidence of a peripheral neuropathy was seen. EMG evaluation of the left lower extremity was unremarkable, without evidence of an overlying lumbosacral radiculopathy.  Jill Alexanders MD 06/30/2014 2:03 PM  [facility] [address] [place], [place] [address]  Phone [phone] Fax [phone]

## 2014-06-30 NOTE — Progress Notes (Signed)
Please refer to EMG and NCV procedure note. 

## 2014-06-30 NOTE — Progress Notes (Signed)
[name] is a 54 year old patient with a history of sensory alteration affecting the left greater than right side affecting the arms and the legs. Patient has had persistent symptoms of the legs. She returns for EMG and nerve conduction studies today.  Nerve conduction studies on all 4 extremities were normal. EMG of the left leg was normal.  The patient has had normal blood work, has had relatively unremarkable studies of the brain and cervical spine. The etiology of the sensory alterations is not clear. The patient will be followed conservatively over time, she is to contact our office if there is evolution of her symptoms.

## 2014-07-01 ENCOUNTER — Other Ambulatory Visit: Payer: Self-pay | Admitting: Internal Medicine

## 2014-09-02 IMAGING — CT CT ANGIO CHEST
2 of 10 series · 19 of 46 positions shown · IV contrast (APPLIED)
Comparison: 07/21/2012

CLINICAL DATA: Chest pain, shortness of breath.

CT ANGIOGRAPHY CHEST
TECHNIQUE: Multidetector CT imaging of the chest using the
standard protocol during bolus administration of intravenous
contrast. Multiplanar reconstructed images including MIPs were
obtained and reviewed to evaluate the vascular anatomy.
Contrast: 100mL OMNIPAQUE IOHEXOL 350 MG/ML SOLN

[Series 6: thins · axial · 0.67mm/px · z∈[+1117,+1319]mm · 16 of 446 slices shown]
[im 21/446  lung]
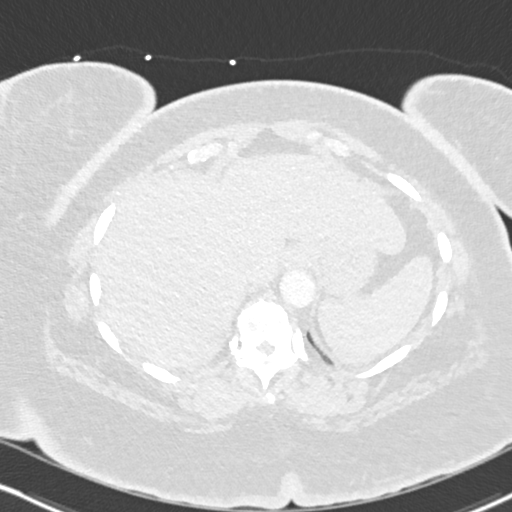
[im 41/446  soft-tissue]
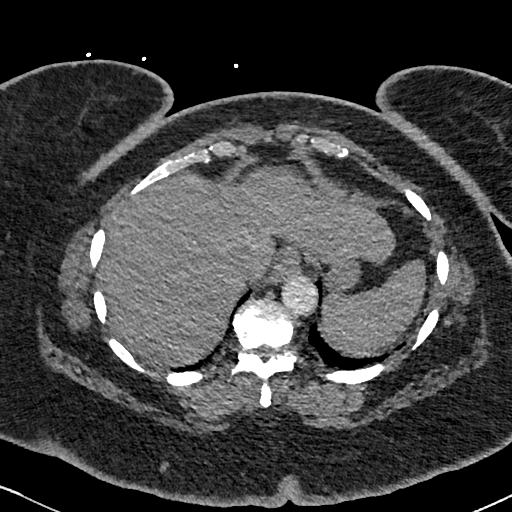
[im 81/446  lung]
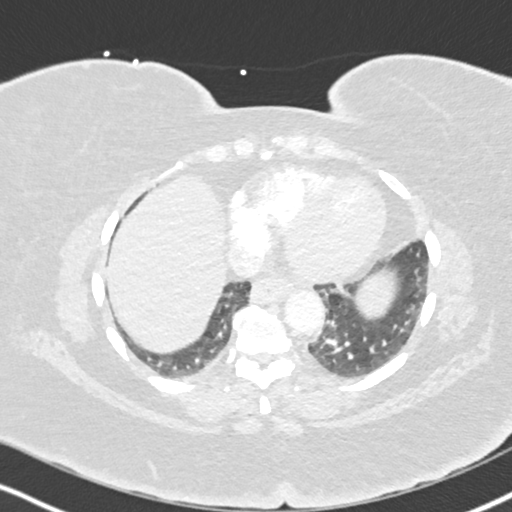
[im 102/446  soft-tissue]
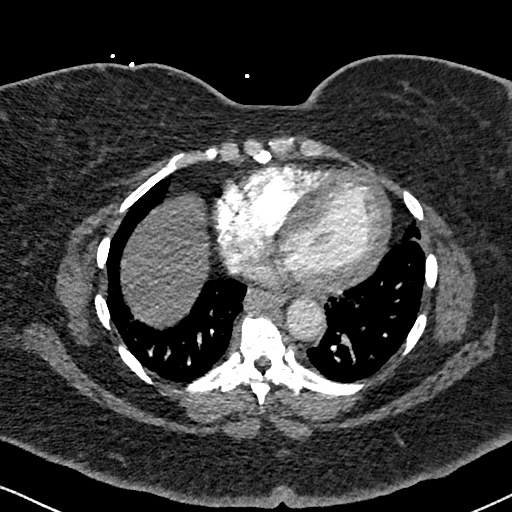
[im 122/446  lung]
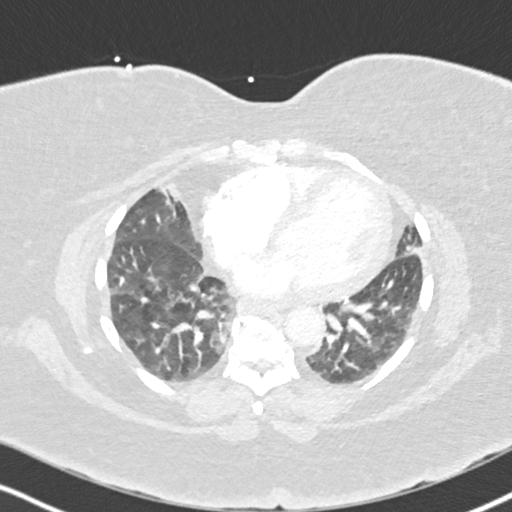
[im 162/446  soft-tissue]
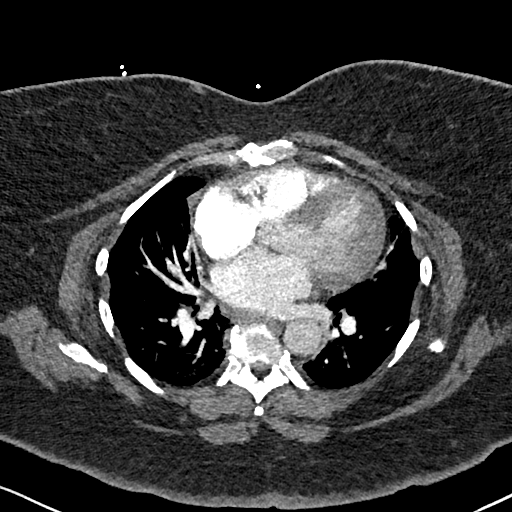
[im 183/446  lung]
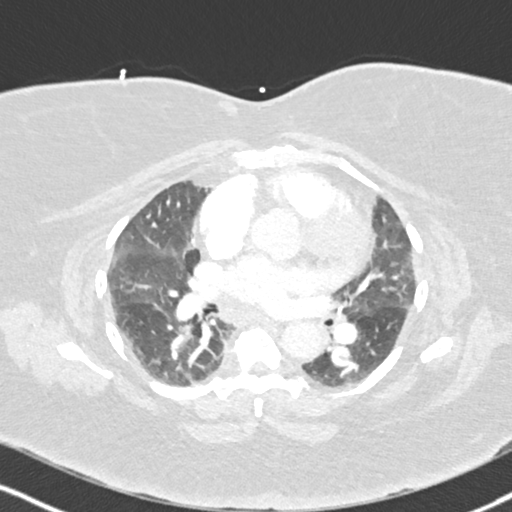
[im 203/446  soft-tissue]
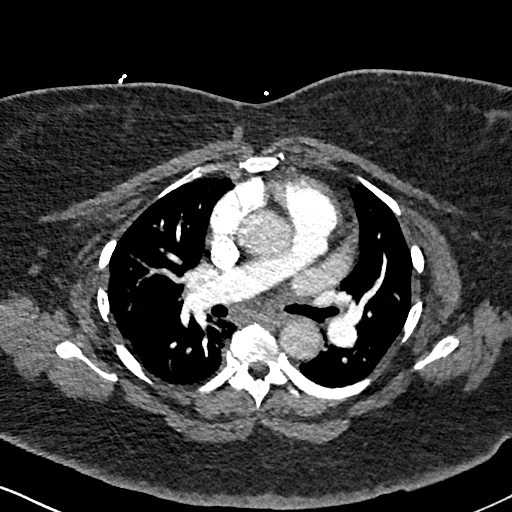
[im 243/446  lung]
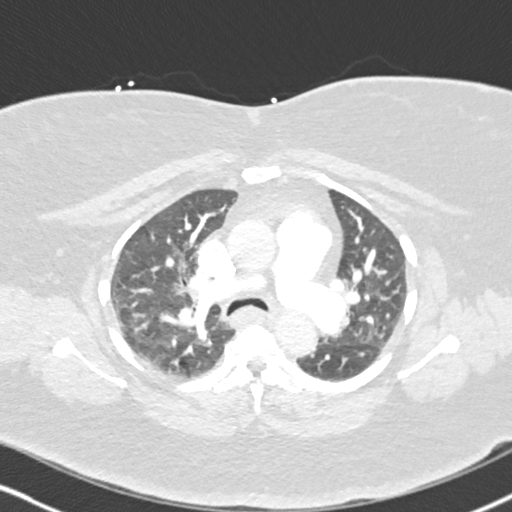
[im 263/446  soft-tissue]
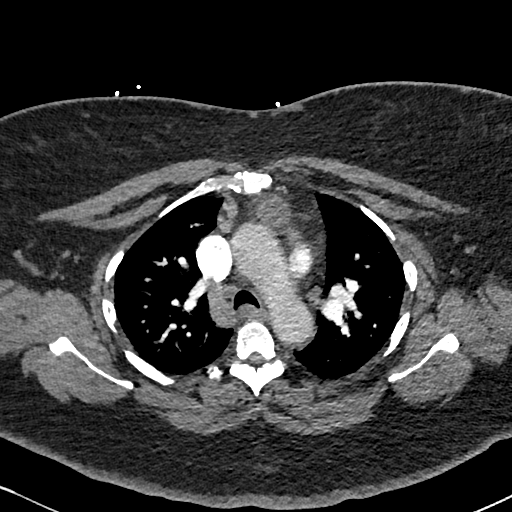
[im 284/446  lung]
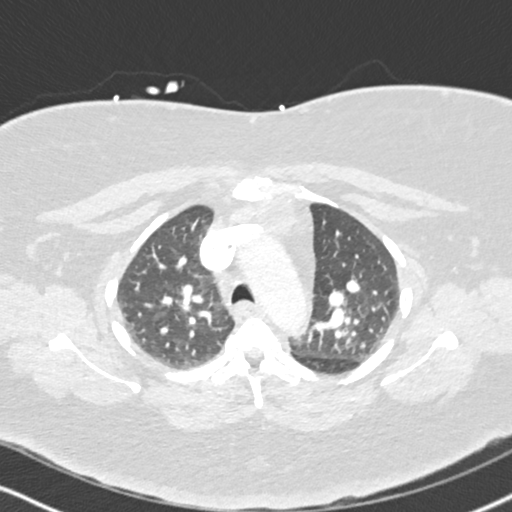
[im 324/446  soft-tissue]
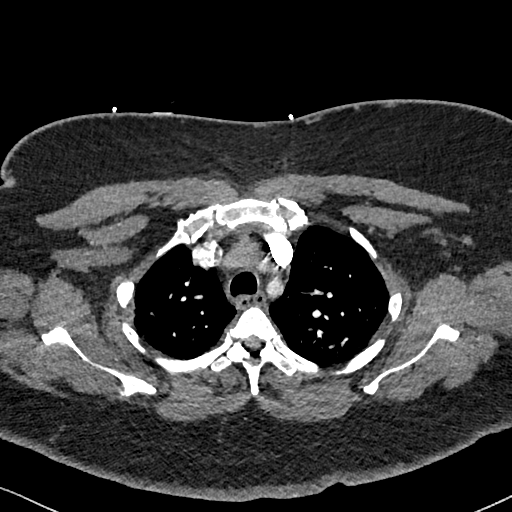
[im 344/446  lung]
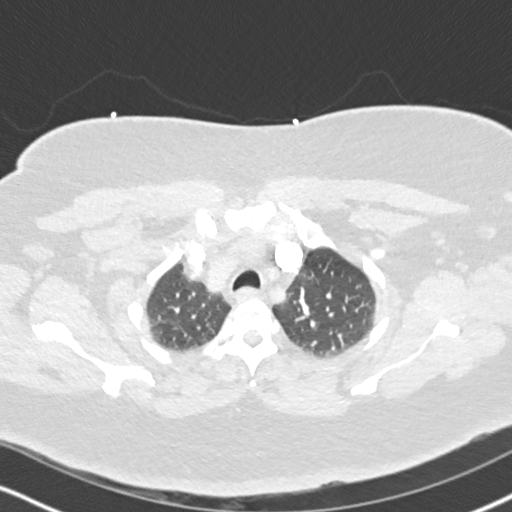
[im 365/446  soft-tissue]
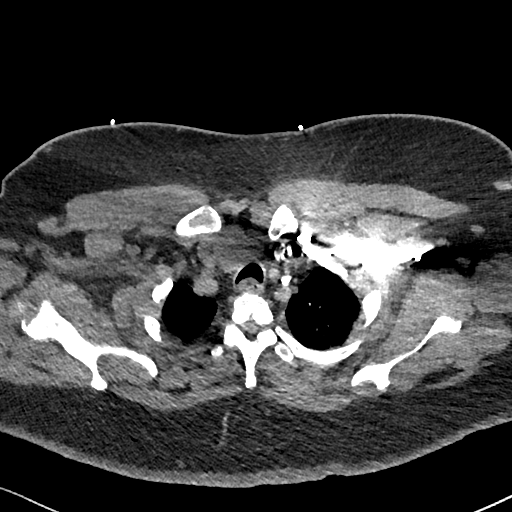
[im 405/446  lung]
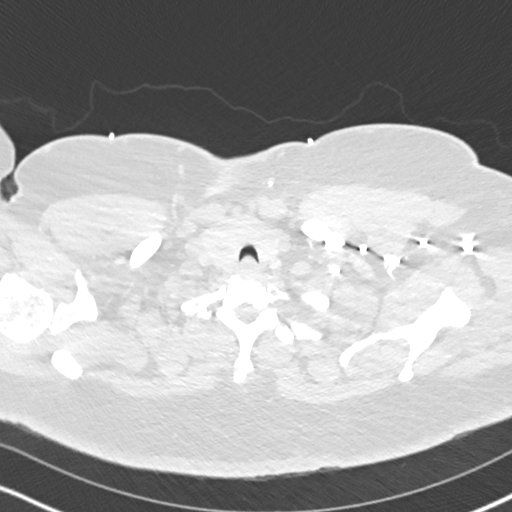
[im 425/446  soft-tissue]
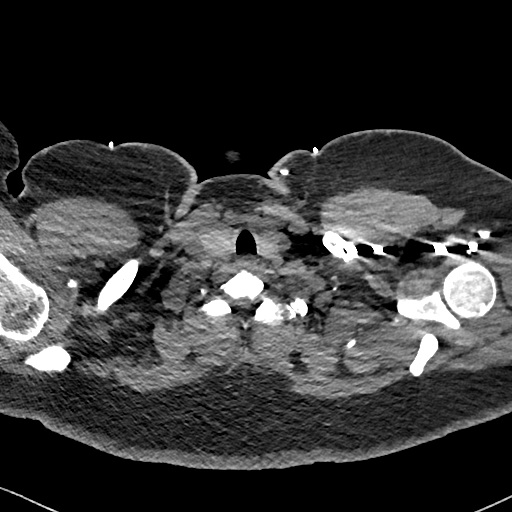

[Series 8: coronal mpr · coronal · 0.59mm/px · 3 of 99 slices shown]
[im 25/99  soft-tissue]
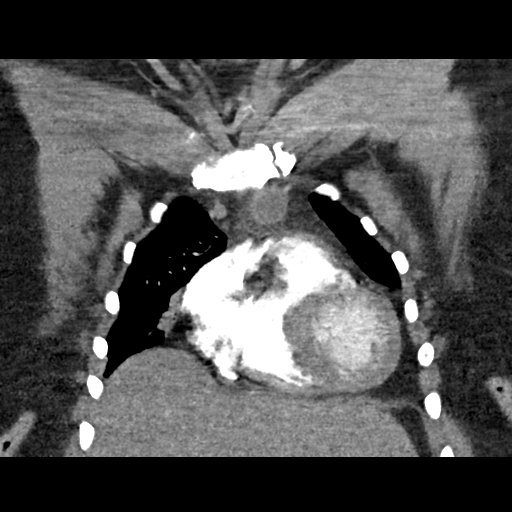
[im 50/99  soft-tissue]
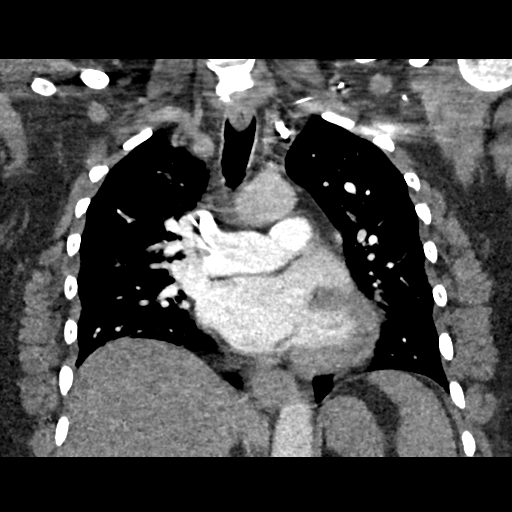
[im 74/99  soft-tissue]
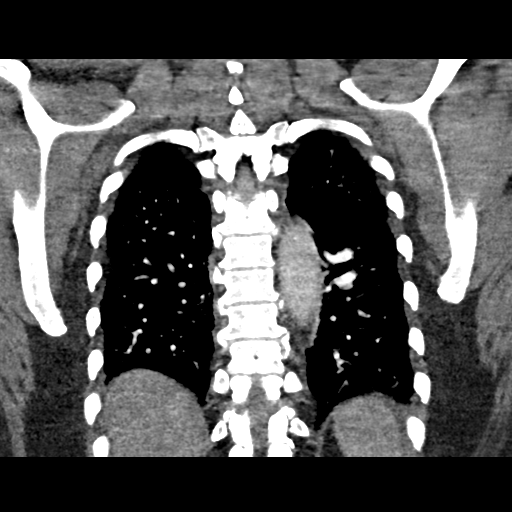

[19 of 46 positions shown; findings below may reference images not displayed]

FINDINGS: No filling defects in the pulmonary arteries to suggest
pulmonary emboli.  Heart is borderline in size.  Aorta is normal
caliber.  No evidence of dissection.  Anterior mediastinal masses
are again noted superiorly.  These are unchanged.  The largest
measures 2.6 x 2.1 cm on image 30.  No other adenopathy.  Small
scattered axillary lymph nodes, not pathologically enlarged.

No pleural effusions.  Low lung volumes.  No confluent airspace
opacities.

Imaging into the upper abdomen shows no acute findings.

No acute bony abnormality.
IMPRESSION: No evidence of pulmonary embolus.

Stable anterior mediastinal masses dating back to 1442.  Findings
compatible with benign process.

No acute findings.

## 2014-09-20 NOTE — Progress Notes (Signed)
Patient ID: [name], female   DOB: [birth year], 54 y.o.   MRN: [id number] 53 y.o.  Previously seen by Dr Martinique with normal heart cath in 2008.  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 Past medical history of diet- controlled diabetes mellitus but started on pills a cuple of months ago, hypertension, obstructive sleep apnea who  I am asked to evaluate for chest pain. The patient did undergo cardiac catheterization in December 2008. At that time, she was found to have  no coronary artery disease and normal LV function. She apparently has had intermittent chest pain for years. She was treated in January 2008  with Coumadin for 6 months for a question of pulmonary embolus on CT. She did have her last CTA in October 2011, which showed no pulmonary  Embolus.   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  All other systems reviewed and negative  General: Affect appropriate Obese black female  HEENT: normal Neck supple with no adenopathy JVP normal no bruits no thyromegaly Lungs clear with no wheezing and good diaphragmatic motion Heart:  S1/S2 no murmur, no rub, gallop or click PMI normal Abdomen: benighn, BS positve, no tenderness, no AAA no bruit.  No HSM or HJR Distal pulses intact with no bruits No edema Neuro non-focal Skin warm and dry No muscular weakness   Current Outpatient Prescriptions  Medication Sig Dispense Refill  . albuterol (PROVENTIL,VENTOLIN) 90 MCG/ACT inhaler Inhale 2  puffs into the lungs every 4 (four) hours as needed for shortness of breath.     [name] aspirin EC 325 MG EC tablet Take 1 tablet (325 mg total) by mouth daily. 30 tablet 0  . atorvastatin (LIPITOR) 40 MG tablet Take 40 mg by mouth at bedtime.     . beclomethasone (QVAR) 80 MCG/ACT inhaler Inhale 2 puffs into the lungs 2 (two) times daily.     . carvedilol (COREG CR) 10 MG 24 hr capsule Take 10 mg by mouth daily.    . cyclobenzaprine (FLEXERIL) 10 MG tablet Take 1 tablet (10 mg total) by mouth 2 (two) times daily as needed for muscle spasms. 15 tablet 0  . furosemide (LASIX) 40 MG tablet Take 40 mg by mouth daily.    [name] gabapentin (NEURONTIN) 300 MG capsule Take 2 capsules (600 mg total) by mouth 3 (three) times daily. 180 capsule 2  . isosorbide dinitrate (ISORDIL) 20 MG tablet Take 20 mg by mouth 2 (two) times daily.     [name] KLOR-CON M10 10 MEQ tablet Take 10 mEq by mouth 2 (two) times daily.     [name] lisinopril (PRINIVIL,ZESTRIL) 20 MG tablet Take 20 mg by mouth daily.     [name] loratadine (CLARITIN) 10 MG tablet Take 10 mg by mouth daily.    . Multiple Vitamins-Minerals (MULTIVITAMIN PO) Take 1 tablet by mouth daily.    . nitroGLYCERIN (NITROSTAT) 0.4 MG SL tablet Place 0.4 mg under the tongue every 5 (five) minutes as needed. For chest pain.    [name] omeprazole (PRILOSEC) 20 MG capsule  Take 20 mg by mouth daily.      [name] oxyCODONE-acetaminophen (PERCOCET/ROXICET) 5-325 MG per tablet Take 1 tablet by mouth every 4 (four) hours as needed for moderate pain or severe pain. 6 tablet 0  . pantoprazole (PROTONIX) 40 MG tablet Take 1 tablet (40 mg total) by mouth daily. 30 tablet 0  . Saxagliptin-Metformin (KOMBIGLYZE XR) 2.08-998 MG TB24 Take 1 tablet by mouth daily.    . sertraline (ZOLOFT) 100 MG tablet Take 100 mg by mouth daily.     [name] tiZANidine (ZANAFLEX) 2 MG tablet Take 2 mg by mouth at bedtime.   0   No current facility-administered medications for this visit.    Allergies  Hydrocodone; Penicillins; and  Sulfa antibiotics  Electrocardiogram:  05/25/14  NSR normal ECG   Assessment and Plan Chest Pain:  Resolved previosly normal cath and myovue osbserv Abnormal Echo:  ? Atrial septal aneurysm vs myxoma f/u echo with contrast if needed DM:  Discussed low carb diet.  Target hemoglobin A1c is 6.5 or less.  Continue current medications. HTN:  Well controlled.  Continue current medications and low sodium Dash type diet.   Chol:   Cholesterol is at goal.  Continue current dose of statin and diet Rx.  No myalgias or side effects.  F/U  LFT's in 6 months. Lab Results  Component Value Date   LDLCALC 103* 04/23/2011   [place] 102* 04/23/2011  Labs with primary             F/U with me in a year if echo ok

## 2014-09-23 ENCOUNTER — Ambulatory Visit (INDEPENDENT_AMBULATORY_CARE_PROVIDER_SITE_OTHER): Payer: Medicare Other | Admitting: Cardiovascular Disease

## 2014-09-23 ENCOUNTER — Encounter: Payer: Self-pay | Admitting: Cardiovascular Disease

## 2014-09-23 VITALS — BP 112/74 | HR 78 | Ht 60.0 in | Wt 224.4 lb

## 2014-09-23 DIAGNOSIS — D151 Benign neoplasm of heart: Secondary | ICD-10-CM

## 2014-09-23 NOTE — Patient Instructions (Signed)
Medication Instructions:  NO CHANGES  Labwork: NONE  Testing/Procedures: Your physician has requested that you have an echocardiogram. Echocardiography is a painless test that uses sound waves to create images of your heart. It provides your doctor with information about the size and shape of your heart and how well your heart's chambers and valves are working. This procedure takes approximately one hour. There are no restrictions for this procedure. WITH  CONTRAST   Follow-Up: Your physician wants you to follow-up in: [place] [name] will receive a reminder letter in the mail two months in advance. If you don't receive a letter, please call our office to schedule the follow-up appointment.  Any Other Special Instructions Will Be Listed Below (If Applicable).

## 2014-09-27 ENCOUNTER — Observation Stay (HOSPITAL_COMMUNITY)
Admission: EM | Admit: 2014-09-27 | Discharge: 2014-09-30 | Disposition: A | Discharge status: Home / Self Care | Payer: Medicare Other | Attending: Internal Medicine | Admitting: Internal Medicine

## 2014-09-27 ENCOUNTER — Encounter (HOSPITAL_COMMUNITY): Payer: Self-pay | Admitting: *Deleted

## 2014-09-27 ENCOUNTER — Emergency Department (HOSPITAL_COMMUNITY): Payer: Medicare Other

## 2014-09-27 DIAGNOSIS — R1030 Lower abdominal pain, unspecified: Secondary | ICD-10-CM | POA: Insufficient documentation

## 2014-09-27 DIAGNOSIS — R079 Chest pain, unspecified: Principal | ICD-10-CM | POA: Diagnosis present

## 2014-09-27 DIAGNOSIS — G4733 Obstructive sleep apnea (adult) (pediatric): Secondary | ICD-10-CM | POA: Diagnosis present

## 2014-09-27 DIAGNOSIS — Z7951 Long term (current) use of inhaled steroids: Secondary | ICD-10-CM | POA: Insufficient documentation

## 2014-09-27 DIAGNOSIS — R112 Nausea with vomiting, unspecified: Secondary | ICD-10-CM | POA: Insufficient documentation

## 2014-09-27 DIAGNOSIS — C189 Malignant neoplasm of colon, unspecified: Secondary | ICD-10-CM | POA: Diagnosis not present

## 2014-09-27 DIAGNOSIS — F419 Anxiety disorder, unspecified: Secondary | ICD-10-CM | POA: Diagnosis not present

## 2014-09-27 DIAGNOSIS — E119 Type 2 diabetes mellitus without complications: Secondary | ICD-10-CM | POA: Diagnosis not present

## 2014-09-27 DIAGNOSIS — R1013 Epigastric pain: Secondary | ICD-10-CM

## 2014-09-27 DIAGNOSIS — I252 Old myocardial infarction: Secondary | ICD-10-CM | POA: Diagnosis not present

## 2014-09-27 DIAGNOSIS — Z7982 Long term (current) use of aspirin: Secondary | ICD-10-CM | POA: Diagnosis not present

## 2014-09-27 DIAGNOSIS — Z79899 Other long term (current) drug therapy: Secondary | ICD-10-CM | POA: Insufficient documentation

## 2014-09-27 DIAGNOSIS — E876 Hypokalemia: Secondary | ICD-10-CM | POA: Diagnosis not present

## 2014-09-27 DIAGNOSIS — E669 Obesity, unspecified: Secondary | ICD-10-CM

## 2014-09-27 DIAGNOSIS — Z9989 Dependence on other enabling machines and devices: Secondary | ICD-10-CM

## 2014-09-27 DIAGNOSIS — A530 Latent syphilis, unspecified as early or late: Secondary | ICD-10-CM | POA: Diagnosis present

## 2014-09-27 DIAGNOSIS — C7951 Secondary malignant neoplasm of bone: Secondary | ICD-10-CM | POA: Diagnosis not present

## 2014-09-27 DIAGNOSIS — Z79891 Long term (current) use of opiate analgesic: Secondary | ICD-10-CM | POA: Insufficient documentation

## 2014-09-27 DIAGNOSIS — F329 Major depressive disorder, single episode, unspecified: Secondary | ICD-10-CM | POA: Diagnosis not present

## 2014-09-27 DIAGNOSIS — Z87891 Personal history of nicotine dependence: Secondary | ICD-10-CM | POA: Insufficient documentation

## 2014-09-27 DIAGNOSIS — E66812 Obesity, class 2: Secondary | ICD-10-CM | POA: Diagnosis present

## 2014-09-27 DIAGNOSIS — Z6838 Body mass index (BMI) 38.0-38.9, adult: Secondary | ICD-10-CM | POA: Diagnosis present

## 2014-09-27 DIAGNOSIS — I10 Essential (primary) hypertension: Secondary | ICD-10-CM | POA: Diagnosis not present

## 2014-09-27 DIAGNOSIS — E785 Hyperlipidemia, unspecified: Secondary | ICD-10-CM | POA: Diagnosis not present

## 2014-09-27 DIAGNOSIS — Z86711 Personal history of pulmonary embolism: Secondary | ICD-10-CM | POA: Diagnosis not present

## 2014-09-27 DIAGNOSIS — R9430 Abnormal result of cardiovascular function study, unspecified: Secondary | ICD-10-CM | POA: Diagnosis not present

## 2014-09-27 DIAGNOSIS — J45909 Unspecified asthma, uncomplicated: Secondary | ICD-10-CM | POA: Insufficient documentation

## 2014-09-27 DIAGNOSIS — Z6841 Body Mass Index (BMI) 40.0 and over, adult: Secondary | ICD-10-CM | POA: Diagnosis not present

## 2014-09-27 DIAGNOSIS — E1169 Type 2 diabetes mellitus with other specified complication: Secondary | ICD-10-CM

## 2014-09-27 DIAGNOSIS — I509 Heart failure, unspecified: Secondary | ICD-10-CM | POA: Diagnosis not present

## 2014-09-27 DIAGNOSIS — R0602 Shortness of breath: Secondary | ICD-10-CM | POA: Insufficient documentation

## 2014-09-27 DIAGNOSIS — N179 Acute kidney failure, unspecified: Secondary | ICD-10-CM | POA: Diagnosis not present

## 2014-09-27 DIAGNOSIS — R5383 Other fatigue: Secondary | ICD-10-CM

## 2014-09-27 DIAGNOSIS — R52 Pain, unspecified: Secondary | ICD-10-CM

## 2014-09-27 LAB — COMPREHENSIVE METABOLIC PANEL
ALT: 26 U/L (ref 14–54)
AST: 21 U/L (ref 15–41)
Albumin: 3.6 g/dL (ref 3.5–5.0)
Alkaline Phosphatase: 56 U/L (ref 38–126)
Anion gap: 13 (ref 5–15)
BUN: 14 mg/dL (ref 6–20)
CO2: 26 mmol/L (ref 22–32)
Calcium: 9.5 mg/dL (ref 8.9–10.3)
Chloride: 103 mmol/L (ref 101–111)
Creatinine, Ser: 1.11 mg/dL — ABNORMAL HIGH (ref 0.44–1.00)
GFR calc Af Amer: >60 mL/min (ref >60)
GFR calc non Af Amer: 55 mL/min — ABNORMAL LOW (ref >60)
Glucose, Bld: 150 mg/dL — ABNORMAL HIGH (ref 65–99)
Potassium: 3.2 mmol/L — ABNORMAL LOW (ref 3.5–5.1)
Sodium: 142 mmol/L (ref 135–145)
Total Bilirubin: 0.2 mg/dL — ABNORMAL LOW (ref 0.3–1.2)
Total Protein: 6.9 g/dL (ref 6.5–8.1)

## 2014-09-27 LAB — CBC WITH DIFFERENTIAL/PLATELET
Basophils Absolute: 0 10*3/uL (ref 0.0–0.1)
Basophils Relative: 0 % (ref 0–1)
Eosinophils Absolute: 0.2 10*3/uL (ref 0.0–0.7)
Eosinophils Relative: 3 % (ref 0–5)
HCT: 39.3 % (ref 36.0–46.0)
Hemoglobin: 12.9 g/dL (ref 12.0–15.0)
Lymphocytes Relative: 39 % (ref 12–46)
Lymphs Abs: 3 10*3/uL (ref 0.7–4.0)
MCH: 28.3 pg (ref 26.0–34.0)
MCHC: 32.8 g/dL (ref 30.0–36.0)
MCV: 86.2 fL (ref 78.0–100.0)
Monocytes Absolute: 0.5 10*3/uL (ref 0.1–1.0)
Monocytes Relative: 6 % (ref 3–12)
Neutro Abs: 3.9 10*3/uL (ref 1.7–7.7)
Neutrophils Relative %: 52 % (ref 43–77)
Platelets: 272 10*3/uL (ref 150–400)
RBC: 4.56 MIL/uL (ref 3.87–5.11)
RDW: 13.9 % (ref 11.5–15.5)
WBC: 7.5 10*3/uL (ref 4.0–10.5)

## 2014-09-27 LAB — LIPASE, BLOOD: Lipase: 50 U/L (ref 22–51)

## 2014-09-27 LAB — URINALYSIS, ROUTINE W REFLEX MICROSCOPIC
Bilirubin Urine: NEGATIVE
Glucose, UA: NEGATIVE mg/dL
Hgb urine dipstick: NEGATIVE
Ketones, ur: NEGATIVE mg/dL
Leukocytes, UA: NEGATIVE
Nitrite: NEGATIVE
Protein, ur: NEGATIVE mg/dL
Specific Gravity, Urine: 1.017 (ref 1.005–1.030)
Urobilinogen, UA: 0.2 mg/dL (ref 0.0–1.0)
pH: 5 (ref 5.0–8.0)

## 2014-09-27 LAB — I-STAT TROPONIN, ED: Troponin i, poc: 0.01 ng/mL (ref 0.00–0.08)

## 2014-09-27 LAB — BRAIN NATRIURETIC PEPTIDE: B Natriuretic Peptide: 16.4 pg/mL (ref 0.0–100.0)

## 2014-09-27 MED ORDER — ONDANSETRON HCL 4 MG/2ML IJ SOLN
4.0000 mg | Freq: Once | INTRAMUSCULAR | Status: AC
  Administered 2014-09-28: 4 mg via INTRAVENOUS
  Filled 2014-09-27: qty 2

## 2014-09-27 MED ORDER — SODIUM CHLORIDE 0.9 % IV BOLUS (SEPSIS)
1000.0000 mL | Freq: Once | INTRAVENOUS | Status: AC
  Administered 2014-09-28: 1000 mL via INTRAVENOUS

## 2014-09-27 MED ORDER — POTASSIUM CHLORIDE CRYS ER 20 MEQ PO TBCR
40.0000 meq | EXTENDED_RELEASE_TABLET | Freq: Once | ORAL | Status: AC
  Administered 2014-09-28: 40 meq via ORAL
  Filled 2014-09-27: qty 2

## 2014-09-27 MED ORDER — NITROGLYCERIN 2 % TD OINT
1.0000 [in_us] | TOPICAL_OINTMENT | Freq: Once | TRANSDERMAL | Status: AC
  Administered 2014-09-27: 1 [in_us] via TOPICAL
  Filled 2014-09-27: qty 1

## 2014-09-27 MED ORDER — ASPIRIN 81 MG PO CHEW
324.0000 mg | CHEWABLE_TABLET | Freq: Once | ORAL | Status: AC
  Administered 2014-09-27: 324 mg via ORAL
  Filled 2014-09-27: qty 4

## 2014-09-27 NOTE — ED Provider Notes (Signed)
CSN: [id number]     Arrival date & time 09/27/14  1840 History   First MD Initiated Contact with Patient 09/27/14 2201     Chief Complaint  Patient presents with  . Generalized Body Aches  . Nausea  . Abdominal Pain     (Consider location/radiation/quality/duration/timing/severity/associated sxs/prior Treatment) Patient is a 54 y.o. female presenting with abdominal pain. The history is provided by the patient and medical records. No language interpreter was used.  Abdominal Pain Associated symptoms: chest pain, fatigue, nausea, shortness of breath and vomiting   Associated symptoms: no constipation, no cough, no diarrhea, no dysuria, no fever and no hematuria      HSER BELANGER is a 54 y.o. female  with a hx of HTN, depression, asthma, anxiety, CHF, NIDDM,  presents to the Emergency Department complaining of gradual, persistent, progressively worsening generalized body aches, chest pain, SOB, nausea, fatigue, lightheadedness, diaphoresis onset 2 days ago. Associated symptoms include 2 episodes of NBNB emesis today.  Pt reports developing lower abdominal pain today which she describes as pressure which is now improved.  No diarrheaNothing makes it better and nothing makes it worse.  Pt denies fever, chills, headache, neck pain, dizziness, syncope, dysuria.  Pt reports that sometimes after she takes the [facility] as an entire tablet in her stool.   Pt also reports left leg swelling beginning yesterday and she reports this can be normal for her.  It is not more swollen than usual and has actually gone down some this evening.      Past Medical History  Diagnosis Date  . Hypertension   . Diabetes mellitus   . Asthma   . Anginal pain     a. NL cath in 2008;  b. Myoview 03/2011: dec uptake along mid anterior wall on stress imaging -> ? attenuation vs. ischemia, EF 65%;  c. Echo 04/2011: EF 55-60%, no RWMA, Gr 2 dd  . Depression   . Anxiety   . Pulmonary embolism     a. 2008 -> coumadin x  6 mos.  . Sleep apnea   . 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 Obesity   . CHF (congestive heart failure)   . CHF (congestive heart failure)   . Pulmonary edema   . Cancer   . Bone cancer    Past Surgical History  Procedure Laterality Date  . Abdominal hysterectomy  2005  . Leg surgery    . Tubal ligation  1989  . Left knee surgery  2008  . Laparoscopic appendectomy N/A 06/03/2012    Procedure: APPENDECTOMY LAPAROSCOPIC;  Surgeon: Stark Klein, MD;  Location: [facility] OR;  Service: General;  Laterality: N/A;  . Cardiac catheterization      Normal   Family History  Problem Relation Age of Onset  . Emphysema Mother   . Arthritis Mother   . Heart failure Mother     alive @ 43  . Asthma Brother   . Heart disease Paternal Grandfather   . Heart disease Father     died @ 3's.  . Stroke Father   . Colon cancer Maternal Grandfather   . Diabetes Sister    History  Substance Use Topics  . Smoking status: Former Smoker -- 0.50 packs/day for 15 years    Types: Cigarettes    Quit date: 04/24/1985  . Smokeless tobacco: Never Used  . Alcohol Use: No   OB History    No data available  Review of Systems  Constitutional: Positive for diaphoresis and fatigue. Negative for fever, appetite change and unexpected weight change.  HENT: Negative for mouth sores.   Eyes: Negative for visual disturbance.  Respiratory: Positive for shortness of breath. Negative for cough, chest tightness and wheezing.   Cardiovascular: Positive for chest pain and leg swelling (left).  Gastrointestinal: Positive for nausea, vomiting and abdominal pain. Negative for diarrhea and constipation.  Endocrine: Negative for polydipsia, polyphagia and polyuria.  Genitourinary: Negative for dysuria, urgency, frequency and hematuria.  Musculoskeletal: Positive for back pain ( low) and arthralgias. Negative for neck stiffness.  Skin: Negative for rash.  Allergic/Immunologic: Negative for  immunocompromised state.  Neurological: Positive for light-headedness. Negative for syncope and headaches.  Hematological: Does not bruise/bleed easily.  Psychiatric/Behavioral: Negative for sleep disturbance. The patient is not nervous/anxious.       Allergies  Hydrocodone; Penicillins; and Sulfa antibiotics  Home Medications   Prior to Admission medications   Medication Sig Start Date End Date Taking? Authorizing Provider  albuterol (PROVENTIL,VENTOLIN) 90 MCG/ACT inhaler Inhale 2 puffs into the lungs every 4 (four) hours as needed for shortness of breath.     Historical Provider, MD  aspirin 81 MG tablet Take 81 mg by mouth daily.    Historical Provider, MD  atorvastatin (LIPITOR) 40 MG tablet Take 40 mg by mouth at bedtime.     Historical Provider, MD  beclomethasone (QVAR) 80 MCG/ACT inhaler Inhale 2 puffs into the lungs 2 (two) times daily.     Historical Provider, MD  carvedilol (COREG CR) 10 MG 24 hr capsule Take 10 mg by mouth daily.    Historical Provider, MD  furosemide (LASIX) 40 MG tablet Take 40 mg by mouth daily.    Ripudeep Krystal Eaton, MD  gabapentin (NEURONTIN) 300 MG capsule Take 2 capsules (600 mg total) by mouth 3 (three) times daily. 05/27/14   Reyne Dumas, MD  isosorbide dinitrate (ISORDIL) 20 MG tablet Take 20 mg by mouth 2 (two) times daily.     Historical Provider, MD  KLOR-CON M10 10 MEQ tablet Take 10 mEq by mouth 2 (two) times daily.  09/23/13   Historical Provider, MD  lisinopril (PRINIVIL,ZESTRIL) 20 MG tablet Take 20 mg by mouth daily.     Historical Provider, MD  loratadine (CLARITIN) 10 MG tablet Take 10 mg by mouth daily.    Historical Provider, MD  Multiple Vitamins-Minerals (MULTIVITAMIN PO) Take 1 tablet by mouth daily.    Historical Provider, MD  nitroGLYCERIN (NITROSTAT) 0.4 MG SL tablet Place 0.4 mg under the tongue every 5 (five) minutes as needed. For chest pain.    Historical Provider, MD  oxyCODONE-acetaminophen (PERCOCET/ROXICET) 5-325 MG per tablet  Take 1 tablet by mouth every 4 (four) hours as needed for moderate pain or severe pain. 04/27/14   Gareth Morgan, MD  pantoprazole (PROTONIX) 40 MG tablet Take 1 tablet (40 mg total) by mouth daily. 05/27/14   Reyne Dumas, MD  Saxagliptin-Metformin (KOMBIGLYZE XR) 2.08-998 MG TB24 Take 1 tablet by mouth daily.    Historical Provider, MD  sertraline (ZOLOFT) 100 MG tablet Take 100 mg by mouth daily.     Historical Provider, MD   BP 122/59 mmHg  Pulse 80  Temp(Src) 97.9 F (36.6 C) (Oral)  Resp 21  SpO2 100% Physical Exam  Constitutional: She appears well-developed and well-nourished. No distress.  Awake, alert, nontoxic appearance  HENT:  Head: Normocephalic and atraumatic.  Mouth/Throat: Oropharynx is clear and moist. No oropharyngeal exudate.  Eyes:  Conjunctivae are normal. No scleral icterus.  Neck: Normal range of motion. Neck supple.  Cardiovascular: Normal rate, regular rhythm, normal heart sounds and intact distal pulses.   No murmur heard. Pulmonary/Chest: Effort normal and breath sounds normal. No respiratory distress. She has no wheezes.  Equal chest expansion Clear and equal breath sounds  Abdominal: Soft. Bowel sounds are normal. She exhibits no mass. There is no tenderness. There is no rebound and no guarding.  Musculoskeletal: Normal range of motion. She exhibits edema.  Mild edema of the left leg; nonpitting  Neurological: She is alert.  Speech is clear and goal oriented Moves extremities without ataxia  Skin: Skin is warm and dry. She is not diaphoretic.  Psychiatric: She has a normal mood and affect.  Nursing note and vitals reviewed.   ED Course  Procedures (including critical care time) Labs Review Labs Reviewed  COMPREHENSIVE METABOLIC PANEL - Abnormal; Notable for the following:    Potassium 3.2 (*)    Glucose, Bld 150 (*)    Creatinine, Ser 1.11 (*)    Total Bilirubin 0.2 (*)    GFR calc non Af Amer 55 (*)    All other components within normal limits   CBC WITH DIFFERENTIAL/PLATELET  LIPASE, BLOOD  URINALYSIS, ROUTINE W REFLEX MICROSCOPIC (NOT AT [facility])  BRAIN NATRIURETIC PEPTIDE  I-STAT TROPOININ, ED    Imaging Review Dg Chest 2 View  09/27/2014   CLINICAL DATA:  Body aches.  EXAM: CHEST  2 VIEW  COMPARISON:  04/27/2014.  FINDINGS: The heart size and mediastinal contours are within normal limits. Both lungs are clear. The visualized skeletal structures are unremarkable.  IMPRESSION: No active cardiopulmonary disease.  Stable appearance from priors.   Electronically Signed   By: Rolla Flatten M.D.   On: 09/27/2014 23:14     EKG Interpretation   Date/Time:  Sunday September 27 2014 22:12:27 EDT Ventricular Rate:  74 PR Interval:  200 QRS Duration: 117 QT Interval:  410 QTC Calculation: 455 R Axis:   80 Text Interpretation:  Sinus rhythm Nonspecific intraventricular conduction  delay Borderline repolarization abnormality nonspecific changes since  previous  Confirmed by YAO  MD, DAVID ([id number]) on 09/27/2014 10:17:19 PM      MDM   Final diagnoses:  Body aches  Chest pain  Non-intractable vomiting with nausea, vomiting of unspecified type  SOB (shortness of breath)  Other fatigue   HIBA GARRY presents with 2 days of chest pain, shortness of breath, body aches, diaphoresis with nausea and vomiting today.  Patient also reports just generally feeling ill. Mild hypokalemia on lab work. Slightly elevated serum creatinine of 1.1.  Chest x-ray without consolidation. EKG with inverted T waves and ST depression and new from previous.  Patient will need admission for a chest pain rule out.    12:33 AM Patient discussed with Dr. Alcario Drought including EKG changes. She will be admitted to telemetry for chest pain rule out. Her pain is improving after nitro and ASA.    BP 122/53 mmHg  Pulse 75  Temp(Src) 97.9 F (36.6 C) (Oral)  Resp 21  SpO2 95%   The patient was discussed with and seen by Dr. Darl Householder who agrees with the treatment  plan.   Jarrett Soho Santita Hunsberger, PA-C 09/28/14 0038  Wandra Arthurs, MD 09/30/14 [phone]

## 2014-09-27 NOTE — ED Notes (Signed)
Pt states that she has generalized body aches, headaches, fatigue, and nausea since Friday.

## 2014-09-28 ENCOUNTER — Encounter (HOSPITAL_COMMUNITY): Payer: Medicare Other

## 2014-09-28 ENCOUNTER — Ambulatory Visit (HOSPITAL_COMMUNITY): Payer: Medicare Other

## 2014-09-28 ENCOUNTER — Observation Stay (HOSPITAL_COMMUNITY): Payer: Medicare Other

## 2014-09-28 DIAGNOSIS — N179 Acute kidney failure, unspecified: Secondary | ICD-10-CM | POA: Diagnosis not present

## 2014-09-28 DIAGNOSIS — I10 Essential (primary) hypertension: Secondary | ICD-10-CM | POA: Diagnosis not present

## 2014-09-28 DIAGNOSIS — R9430 Abnormal result of cardiovascular function study, unspecified: Secondary | ICD-10-CM | POA: Diagnosis not present

## 2014-09-28 DIAGNOSIS — R112 Nausea with vomiting, unspecified: Secondary | ICD-10-CM | POA: Diagnosis not present

## 2014-09-28 DIAGNOSIS — E876 Hypokalemia: Secondary | ICD-10-CM | POA: Diagnosis not present

## 2014-09-28 DIAGNOSIS — R079 Chest pain, unspecified: Secondary | ICD-10-CM | POA: Diagnosis present

## 2014-09-28 DIAGNOSIS — E119 Type 2 diabetes mellitus without complications: Secondary | ICD-10-CM | POA: Diagnosis not present

## 2014-09-28 LAB — TROPONIN I
Troponin I: 0.03 ng/mL (ref <0.031)
Troponin I: <0.03 ng/mL (ref <0.031)
Troponin I: <0.03 ng/mL (ref <0.031)

## 2014-09-28 LAB — BASIC METABOLIC PANEL
Anion gap: 11 (ref 5–15)
BUN: 12 mg/dL (ref 6–20)
CO2: 26 mmol/L (ref 22–32)
Calcium: 8.8 mg/dL — ABNORMAL LOW (ref 8.9–10.3)
Chloride: 103 mmol/L (ref 101–111)
Creatinine, Ser: 0.92 mg/dL (ref 0.44–1.00)
GFR calc Af Amer: >60 mL/min (ref >60)
GFR calc non Af Amer: >60 mL/min (ref >60)
Glucose, Bld: 174 mg/dL — ABNORMAL HIGH (ref 65–99)
Potassium: 3.5 mmol/L (ref 3.5–5.1)
Sodium: 140 mmol/L (ref 135–145)

## 2014-09-28 LAB — GLUCOSE, CAPILLARY
Glucose-Capillary: 111 mg/dL — ABNORMAL HIGH (ref 65–99)
Glucose-Capillary: 104 mg/dL — ABNORMAL HIGH (ref 65–99)
Glucose-Capillary: 180 mg/dL — ABNORMAL HIGH (ref 65–99)
Glucose-Capillary: 161 mg/dL — ABNORMAL HIGH (ref 65–99)

## 2014-09-28 LAB — NM MYOCAR MULTI W/SPECT W/WALL MOTION / EF
LV dias vol: 87 mL
LV sys vol: 34 mL
Nuc Stress EF: 61 %
RATE: 0.38
SDS: 4
SRS: 9
SSS: 13
TID: 1.16

## 2014-09-28 LAB — MRSA PCR SCREENING: MRSA by PCR: NEGATIVE

## 2014-09-28 MED ORDER — CARVEDILOL PHOSPHATE ER 10 MG PO CP24
10.0000 mg | ORAL_CAPSULE | Freq: Every day | ORAL | Status: DC
  Administered 2014-09-28 – 2014-09-30 (×3): 10 mg via ORAL
  Filled 2014-09-28 (×4): qty 1

## 2014-09-28 MED ORDER — SODIUM CHLORIDE 0.9 % IJ SOLN: 3.0000 mL | INTRAMUSCULAR | Status: DC | PRN

## 2014-09-28 MED ORDER — SODIUM CHLORIDE 0.9 % IV SOLN: 250.0000 mL | INTRAVENOUS | Status: DC | PRN

## 2014-09-28 MED ORDER — SODIUM CHLORIDE 0.9 % IJ SOLN
3.0000 mL | Freq: Two times a day (BID) | INTRAMUSCULAR | Status: DC
  Administered 2014-09-28 – 2014-09-29 (×2): 3 mL via INTRAVENOUS

## 2014-09-28 MED ORDER — ACETAMINOPHEN 325 MG PO TABS
650.0000 mg | ORAL_TABLET | ORAL | Status: DC | PRN
  Administered 2014-09-29 – 2014-09-30 (×2): 650 mg via ORAL
  Filled 2014-09-28 (×2): qty 2

## 2014-09-28 MED ORDER — SODIUM CHLORIDE 0.9 % IV SOLN
INTRAVENOUS | Status: DC
Start: 2014-09-29 — End: 2014-09-29
  Administered 2014-09-29: 05:00:00 via INTRAVENOUS

## 2014-09-28 MED ORDER — LORATADINE 10 MG PO TABS
10.0000 mg | ORAL_TABLET | Freq: Every day | ORAL | Status: DC
  Administered 2014-09-28 – 2014-09-30 (×3): 10 mg via ORAL
  Filled 2014-09-28 (×3): qty 1

## 2014-09-28 MED ORDER — TECHNETIUM TC 99M SESTAMIBI GENERIC - CARDIOLITE
10.0000 | Freq: Once | INTRAVENOUS | Status: AC | PRN
  Administered 2014-09-28: 10 via INTRAVENOUS

## 2014-09-28 MED ORDER — ASPIRIN 81 MG PO CHEW
81.0000 mg | CHEWABLE_TABLET | Freq: Every day | ORAL | Status: DC
  Administered 2014-09-28 – 2014-09-30 (×2): 81 mg via ORAL
  Filled 2014-09-28 (×2): qty 1

## 2014-09-28 MED ORDER — LISINOPRIL 10 MG PO TABS
10.0000 mg | ORAL_TABLET | Freq: Every day | ORAL | Status: DC
  Administered 2014-09-28 – 2014-09-30 (×3): 10 mg via ORAL
  Filled 2014-09-28 (×3): qty 1

## 2014-09-28 MED ORDER — PANTOPRAZOLE SODIUM 40 MG PO TBEC
40.0000 mg | DELAYED_RELEASE_TABLET | Freq: Every day | ORAL | Status: DC
  Administered 2014-09-28 – 2014-09-30 (×3): 40 mg via ORAL
  Filled 2014-09-28 (×3): qty 1

## 2014-09-28 MED ORDER — TECHNETIUM TC 99M SESTAMIBI - CARDIOLITE
30.0000 | Freq: Once | INTRAVENOUS | Status: AC | PRN
  Administered 2014-09-28: 30 via INTRAVENOUS

## 2014-09-28 MED ORDER — POTASSIUM CHLORIDE CRYS ER 10 MEQ PO TBCR
10.0000 meq | EXTENDED_RELEASE_TABLET | Freq: Two times a day (BID) | ORAL | Status: DC
  Administered 2014-09-28 – 2014-09-30 (×6): 10 meq via ORAL
  Filled 2014-09-28 (×6): qty 1

## 2014-09-28 MED ORDER — ATORVASTATIN CALCIUM 40 MG PO TABS
40.0000 mg | ORAL_TABLET | Freq: Every day | ORAL | Status: DC
  Administered 2014-09-28 – 2014-09-29 (×2): 40 mg via ORAL
  Filled 2014-09-28 (×2): qty 1

## 2014-09-28 MED ORDER — ASPIRIN 81 MG PO CHEW
81.0000 mg | CHEWABLE_TABLET | ORAL | Status: AC
  Administered 2014-09-29: 81 mg via ORAL
  Filled 2014-09-28: qty 1

## 2014-09-28 MED ORDER — MORPHINE SULFATE 2 MG/ML IJ SOLN
2.0000 mg | INTRAMUSCULAR | Status: DC | PRN
  Administered 2014-09-28 – 2014-09-29 (×4): 2 mg via INTRAVENOUS
  Filled 2014-09-28 (×4): qty 1

## 2014-09-28 MED ORDER — HEPARIN SODIUM (PORCINE) 5000 UNIT/ML IJ SOLN
5000.0000 [IU] | Freq: Three times a day (TID) | INTRAMUSCULAR | Status: DC
  Administered 2014-09-28 – 2014-09-29 (×4): 5000 [IU] via SUBCUTANEOUS
  Filled 2014-09-28 (×5): qty 1

## 2014-09-28 MED ORDER — FUROSEMIDE 40 MG PO TABS
40.0000 mg | ORAL_TABLET | Freq: Every day | ORAL | Status: DC
  Administered 2014-09-28: 40 mg via ORAL
  Filled 2014-09-28: qty 1

## 2014-09-28 MED ORDER — ISOSORBIDE DINITRATE 10 MG PO TABS
20.0000 mg | ORAL_TABLET | Freq: Two times a day (BID) | ORAL | Status: DC
  Administered 2014-09-28 – 2014-09-30 (×5): 20 mg via ORAL
  Filled 2014-09-28 (×5): qty 2

## 2014-09-28 MED ORDER — ALBUTEROL SULFATE (2.5 MG/3ML) 0.083% IN NEBU: 3.0000 mL | INHALATION_SOLUTION | RESPIRATORY_TRACT | Status: DC | PRN

## 2014-09-28 MED ORDER — MORPHINE SULFATE 4 MG/ML IJ SOLN
4.0000 mg | Freq: Once | INTRAMUSCULAR | Status: AC
  Administered 2014-09-28: 4 mg via INTRAVENOUS
  Filled 2014-09-28: qty 1

## 2014-09-28 MED ORDER — SERTRALINE HCL 100 MG PO TABS
100.0000 mg | ORAL_TABLET | Freq: Every day | ORAL | Status: DC
  Administered 2014-09-28 – 2014-09-30 (×3): 100 mg via ORAL
  Filled 2014-09-28 (×4): qty 1

## 2014-09-28 MED ORDER — REGADENOSON 0.4 MG/5ML IV SOLN
INTRAVENOUS | Status: AC
  Administered 2014-09-28: 0.4 mg
  Filled 2014-09-28: qty 5

## 2014-09-28 MED ORDER — ONDANSETRON HCL 4 MG/2ML IJ SOLN: 4.0000 mg | Freq: Four times a day (QID) | INTRAMUSCULAR | Status: DC | PRN

## 2014-09-28 MED ORDER — GABAPENTIN 300 MG PO CAPS
600.0000 mg | ORAL_CAPSULE | Freq: Three times a day (TID) | ORAL | Status: DC
Start: 2014-09-28 — End: 2014-09-30
  Administered 2014-09-28 – 2014-09-30 (×6): 600 mg via ORAL
  Filled 2014-09-28 (×6): qty 2

## 2014-09-28 NOTE — Progress Notes (Signed)
1 day Lexiscan stress test completed without complication. Pending final result by [facility] Radiology reader.  Hilbert Corrigan PA Pager: [phone]

## 2014-09-28 NOTE — Progress Notes (Signed)
Myoview abnormal, result below:   Findings consistent with prior myocardial infarction with peri-infarct ischemia.  The left ventricular ejection fraction is normal (55-65%).  Defect 1: There is a medium defect of mild severity present in the mid inferolateral, mid anterolateral, apical lateral and apex location.  Suspected interval infarct in the distal lateral wall with suspected peri-infarct ischemia, especially in apex and anterolateral margin.   Per record, patient had normal coronaries in 2008.  After discussing with MD, i have discussed with patient regarding abnormal myoview.  Risk and benefit of procedure explained to the patient who display clear understanding and agree to proceed.  Discussed with patient possible procedural risk include bleeding, vascular injury, renal injury, arrythmia, MI, stroke and loss of limb or life.  Today's schedule full, given patient food. NPO past midnight for cath tomorrow.  Hilbert Corrigan PA Pager: [phone]

## 2014-09-28 NOTE — Progress Notes (Signed)
  Echocardiogram 2D Echocardiogram has been performed.  [name] 09/28/2014, 10:24 AM

## 2014-09-28 NOTE — Progress Notes (Signed)
PT Cancellation Note  Patient Details Name: [name] MRN: [id number] DOB: [birth year]   Cancelled Treatment:    Reason Eval/Treat Not Completed: Other (comment) (abnormalities found on the stress test, will await CATH 6/7)  09/28/2014  [name], PT [phone]  (pager)   [name], Tessie Fass 09/28/2014, 5:50 PM

## 2014-09-28 NOTE — H&P (Addendum)
[facility] History and Physical  [name] MMH:[id number] DOB: 27-Nov-1960 DOA: 09/27/2014  Referring physician: EDP PCP: Velna Hatchet, MD   Chief Complaint: Chest pain   HPI: [name] is a 54 y.o. female with h/o HTN, DM2, patient presents to ED with c/o chest pain, body aches, SOB, nausea, fatigue.  Symptoms onset 2 days ago.  2 episodes of NBNB emesis today.  Also developed lower abdominal pain "pressure" which is improved.  Nothing makes symptoms worse.  NTG given in ED made symptoms better.  Review of Systems: Systems reviewed.  As above, otherwise negative  Past Medical History  Diagnosis Date  . Hypertension   . Diabetes mellitus   . Asthma   . Anginal pain     a. NL cath in 2008;  b. Myoview 03/2011: dec uptake along mid anterior wall on stress imaging -> ? attenuation vs. ischemia, EF 65%;  c. Echo 04/2011: EF 55-60%, no RWMA, Gr 2 dd  . Depression   . Anxiety   . Pulmonary embolism     a. 2008 -> coumadin x 6 mos.  . Sleep apnea   . 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 Obesity   . CHF (congestive heart failure)   . CHF (congestive heart failure)   . Pulmonary edema   . Cancer   . Bone cancer    Past Surgical History  Procedure Laterality Date  . Abdominal hysterectomy  2005  . Leg surgery    . Tubal ligation  1989  . Left knee surgery  2008  . Laparoscopic appendectomy N/A 06/03/2012    Procedure: APPENDECTOMY LAPAROSCOPIC;  Surgeon: Stark Klein, MD;  Location: [facility];  Service: General;  Laterality: N/A;  . Cardiac catheterization      Normal   Social History:  reports that she quit smoking about 29 years ago. Her smoking use included Cigarettes. She has a 7.5 pack-year smoking history. She has never used smokeless tobacco. She reports that she does not drink alcohol or use illicit drugs.  Allergies  Allergen Reactions  . Hydrocodone Other (See Comments)    Causes Hallucinations  . Penicillins Swelling  .  Sulfa Antibiotics Diarrhea, Itching and Rash    Family History  Problem Relation Age of Onset  . Emphysema Mother   . Arthritis Mother   . Heart failure Mother     alive @ 53  . Asthma Brother   . Heart disease Paternal Grandfather   . Heart disease Father     died @ 64's.  . Stroke Father   . Colon cancer Maternal Grandfather   . Diabetes Sister      Prior to Admission medications   Medication Sig Start Date End Date Taking? Authorizing Provider  albuterol (PROVENTIL,VENTOLIN) 90 MCG/ACT inhaler Inhale 2 puffs into the lungs every 4 (four) hours as needed for shortness of breath.    Yes Historical Provider, MD  aspirin 81 MG tablet Take 81 mg by mouth daily.   Yes Historical Provider, MD  atorvastatin (LIPITOR) 40 MG tablet Take 40 mg by mouth at bedtime.    Yes Historical Provider, MD  carvedilol (COREG CR) 10 MG 24 hr capsule Take 10 mg by mouth daily.   Yes Historical Provider, MD  furosemide (LASIX) 40 MG tablet Take 40 mg by mouth daily.   Yes Ripudeep Krystal Eaton, MD  gabapentin (NEURONTIN) 300 MG capsule Take 2 capsules (600 mg total) by mouth 3 (  three) times daily. 05/27/14  Yes Reyne Dumas, MD  isosorbide dinitrate (ISORDIL) 20 MG tablet Take 20 mg by mouth 2 (two) times daily.    Yes Historical Provider, MD  KLOR-CON M10 10 MEQ tablet Take 10 mEq by mouth 2 (two) times daily.  09/23/13  Yes Historical Provider, MD  lisinopril (PRINIVIL,ZESTRIL) 10 MG tablet Take 1 tablet by mouth daily. 09/21/14  Yes Historical Provider, MD  loratadine (CLARITIN) 10 MG tablet Take 10 mg by mouth daily.   Yes Historical Provider, MD  Multiple Vitamins-Minerals (MULTIVITAMIN PO) Take 1 tablet by mouth daily.   Yes Historical Provider, MD  nitroGLYCERIN (NITROSTAT) 0.4 MG SL tablet Place 0.4 mg under the tongue every 5 (five) minutes as needed. For chest pain.   Yes Historical Provider, MD  pantoprazole (PROTONIX) 40 MG tablet Take 1 tablet (40 mg total) by mouth daily. 05/27/14  Yes Reyne Dumas, MD   Saxagliptin-Metformin (KOMBIGLYZE XR) 2.08-998 MG TB24 Take 1 tablet by mouth 2 (two) times daily.    Yes Historical Provider, MD  sertraline (ZOLOFT) 100 MG tablet Take 100 mg by mouth daily.    Yes Historical Provider, MD  oxyCODONE-acetaminophen (PERCOCET/ROXICET) 5-325 MG per tablet Take 1 tablet by mouth every 4 (four) hours as needed for moderate pain or severe pain. Patient not taking: Reported on 09/28/2014 04/27/14   Gareth Morgan, MD   Physical Exam: Filed Vitals:   09/28/14 0000  BP: 122/53  Pulse: 75  Temp:   Resp: 21    BP 122/53 mmHg  Pulse 75  Temp(Src) 97.9 F (36.6 C) (Oral)  Resp 21  SpO2 95%  General Appearance:    Alert, oriented, no distress, appears stated age  Head:    Normocephalic, atraumatic  Eyes:    PERRL, EOMI, sclera non-icteric        Nose:   Nares without drainage or epistaxis. Mucosa, turbinates normal  Throat:   Moist mucous membranes. Oropharynx without erythema or exudate.  Neck:   Supple. No carotid bruits.  No thyromegaly.  No lymphadenopathy.   Back:     No CVA tenderness, no spinal tenderness  Lungs:     Clear to auscultation bilaterally, without wheezes, rhonchi or rales  Chest wall:    No tenderness to palpitation  Heart:    Regular rate and rhythm without murmurs, gallops, rubs  Abdomen:     Soft, non-tender, nondistended, normal bowel sounds, no organomegaly  Genitalia:    deferred  Rectal:    deferred  Extremities:   No clubbing, cyanosis or edema.  Pulses:   2+ and symmetric all extremities  Skin:   Skin color, texture, turgor normal, no rashes or lesions  Lymph nodes:   Cervical, supraclavicular, and axillary nodes normal  Neurologic:   CNII-XII intact. Normal strength, sensation and reflexes      throughout    Labs on Admission:  Basic Metabolic Panel:  Recent Labs Lab 09/27/14 1930  NA 142  K 3.2*  CL 103  CO2 26  GLUCOSE 150*  BUN 14  CREATININE 1.11*  CALCIUM 9.5   Liver Function Tests:  Recent Labs Lab  09/27/14 1930  AST 21  ALT 26  ALKPHOS 56  BILITOT 0.2*  PROT 6.9  ALBUMIN 3.6    Recent Labs Lab 09/27/14 1930  LIPASE 50   No results for input(s): AMMONIA in the last 168 hours. CBC:  Recent Labs Lab 09/27/14 1930  WBC 7.5  NEUTROABS 3.9  HGB 12.9  HCT 39.3  MCV 86.2  PLT 272   Cardiac Enzymes: No results for input(s): CKTOTAL, CKMB, CKMBINDEX, TROPONINI in the last 168 hours.  BNP (last 3 results)  Recent Labs  12/01/13 1439 02/24/14 1231  PROBNP 71.8 36.4   CBG: No results for input(s): GLUCAP in the last 168 hours.  Radiological Exams on Admission: Dg Chest 2 View  09/27/2014   CLINICAL DATA:  Body aches.  EXAM: CHEST  2 VIEW  COMPARISON:  04/27/2014.  FINDINGS: The heart size and mediastinal contours are within normal limits. Both lungs are clear. The visualized skeletal structures are unremarkable.  IMPRESSION: No active cardiopulmonary disease.  Stable appearance from priors.   Electronically Signed   By: Rolla Flatten M.D.   On: 09/27/2014 23:14    EKG: Independently reviewed. New T wave inversions and ST segment mild depressions in lateral leads.  These are new when compared to Feb of this year.  Assessment/Plan Principal Problem:   Chest pain with moderate risk for cardiac etiology Active Problems:   DM2 (diabetes mellitus, type 2)   HTN (hypertension)   1. Chest pain - HEART score is 5 1. Chest pain obs pathway 2. Tele monitor 3. Serial trops 4. NPO for possible stress test in AM 5. Continue NTG paste and morphine PRN 2. DM2 - Hold home metformin while NPO 3. HTN - continue home meds    Code Status: Full Code  Family Communication: No family in room Disposition Plan: Admit to obs   Time spent: 50 min  GARDNER, JARED M. [facility] Pager [phone]  If 7AM-7PM, please contact the day team taking care of the patient [email or web address] Password [email or web address] 09/28/2014, 1:05 AM

## 2014-09-28 NOTE — Consult Note (Signed)
CARDIOLOGY CONSULT NOTE  Patient ID: [name], MRN: [id number], DOB/AGE: 07-08-1960 54 y.o. Admit date: 09/27/2014 Date of Consult: 09/28/2014  Primary Physician: Velna Hatchet, MD Primary Cardiologist: [name], MD  Referring Physician: Jennette Kettle, DO  Chief Complaint: CHEST PAIN  Reason for Consultation: CHEST PAIN/ABNORMAL EKG  HPI: 54 year old woman with obesity, hypertension, and type 2 diabetes. She presents with approximately 48-72 hours of waxing and waning chest discomfort. She describes a tall intake over the left chest, but also in the upper abdomen. She has had nausea and vomiting. Denies fevers or chills. There is a pleuritic component to her chest pain. There was some transient relief with nitroglycerin. The patient also complains of multiple body aches, shortness of breath, and fatigue. She has had similar symptoms in the past. She's had multiple chest pain evaluations with last nuclear stress test in 2014. Cardiac catheterization in 2008 showed normal coronary arteries. An echocardiogram several months ago showed normal LV systolic function with diastolic dysfunction and question of atrial septal aneurysm versus myxoma.  Medical History:  Past Medical History  Diagnosis Date  . Hypertension   . Diabetes mellitus   . Asthma   . Anginal pain     a. NL cath in 2008;  b. Myoview 03/2011: dec uptake along mid anterior wall on stress imaging -> ? attenuation vs. ischemia, EF 65%;  c. Echo 04/2011: EF 55-60%, no RWMA, Gr 2 dd  . Depression   . Anxiety   . Pulmonary embolism     a. 2008 -> coumadin x 6 mos.  . Sleep apnea   . 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 Obesity   . CHF (congestive heart failure)   . CHF (congestive heart failure)   . Pulmonary edema   . Cancer   . Bone cancer       Surgical History:  Past Surgical History  Procedure Laterality Date  . Abdominal hysterectomy  2005  . Leg surgery    . Tubal ligation   1989  . Left knee surgery  2008  . Laparoscopic appendectomy N/A 06/03/2012    Procedure: APPENDECTOMY LAPAROSCOPIC;  Surgeon: Stark Klein, MD;  Location: [facility];  Service: General;  Laterality: N/A;  . Cardiac catheterization      Normal     Home Meds: Prior to Admission medications   Medication Sig Start Date End Date Taking? Authorizing Provider  albuterol (PROVENTIL,VENTOLIN) 90 MCG/ACT inhaler Inhale 2 puffs into the lungs every 4 (four) hours as needed for shortness of breath.    Yes Historical Provider, MD  aspirin 81 MG tablet Take 81 mg by mouth daily.   Yes Historical Provider, MD  atorvastatin (LIPITOR) 40 MG tablet Take 40 mg by mouth at bedtime.    Yes Historical Provider, MD  carvedilol (COREG CR) 10 MG 24 hr capsule Take 10 mg by mouth daily.   Yes Historical Provider, MD  furosemide (LASIX) 40 MG tablet Take 40 mg by mouth daily.   Yes Ripudeep Krystal Eaton, MD  gabapentin (NEURONTIN) 300 MG capsule Take 2 capsules (600 mg total) by mouth 3 (three) times daily. 05/27/14  Yes Reyne Dumas, MD  isosorbide dinitrate (ISORDIL) 20 MG tablet Take 20 mg by mouth 2 (two) times daily.    Yes Historical Provider, MD  KLOR-CON M10 10 MEQ tablet Take 10 mEq by mouth 2 (two) times daily.  09/23/13  Yes Historical Provider, MD  lisinopril (PRINIVIL,ZESTRIL) 10  MG tablet Take 1 tablet by mouth daily. 09/21/14  Yes Historical Provider, MD  loratadine (CLARITIN) 10 MG tablet Take 10 mg by mouth daily.   Yes Historical Provider, MD  Multiple Vitamins-Minerals (MULTIVITAMIN PO) Take 1 tablet by mouth daily.   Yes Historical Provider, MD  nitroGLYCERIN (NITROSTAT) 0.4 MG SL tablet Place 0.4 mg under the tongue every 5 (five) minutes as needed. For chest pain.   Yes Historical Provider, MD  pantoprazole (PROTONIX) 40 MG tablet Take 1 tablet (40 mg total) by mouth daily. 05/27/14  Yes Reyne Dumas, MD  Saxagliptin-Metformin (KOMBIGLYZE XR) 2.08-998 MG TB24 Take 1 tablet by mouth 2 (two) times daily.    Yes  Historical Provider, MD  sertraline (ZOLOFT) 100 MG tablet Take 100 mg by mouth daily.    Yes Historical Provider, MD    Inpatient Medications:  . aspirin  81 mg Oral Daily  . atorvastatin  40 mg Oral QHS  . carvedilol  10 mg Oral Daily  . furosemide  40 mg Oral Daily  . gabapentin  600 mg Oral TID  . heparin  5,000 Units Subcutaneous 3 times per day  . isosorbide dinitrate  20 mg Oral BID  . lisinopril  10 mg Oral Daily  . loratadine  10 mg Oral Daily  . pantoprazole  40 mg Oral Daily  . potassium chloride  10 mEq Oral BID  . sertraline  100 mg Oral Daily      Allergies:  Allergies  Allergen Reactions  . Hydrocodone Other (See Comments)    Causes Hallucinations  . Penicillins Swelling  . Sulfa Antibiotics Diarrhea, Itching and Rash    History   Social History  . Marital Status: Legally Separated    Spouse Name: N/A  . Number of Children: N/A  . Years of Education: N/A   Occupational History  . UNEMPLOYED    Social History Main Topics  . Smoking status: Former Smoker -- 0.50 packs/day for 15 years    Types: Cigarettes    Quit date: 04/24/1985  . Smokeless tobacco: Never Used  . Alcohol Use: No  . Drug Use: No  . Sexual Activity: Not on file   Other Topics Concern  . Not on file   Social History Narrative   Lives in [place] by herself.  Disabled.  Caffeine 1 cup avg daily.  3 kids, 6 grandkids.       Family History  Problem Relation Age of Onset  . Emphysema Mother   . Arthritis Mother   . Heart failure Mother     alive @ 60  . Asthma Brother   . Heart disease Paternal Grandfather   . Heart disease Father     died @ 59's.  . Stroke Father   . Colon cancer Maternal Grandfather   . Diabetes Sister      Review of Systems: General: negative for chills, fever, night sweats or weight changes. Positive for fatigue ENT: negative for rhinorrhea or epistaxis Cardiovascular: See history of present illness Dermatological: negative for rash Respiratory:  negative for cough or wheezing. Positive for shortness of breath GI: Positive for nausea and abdominal pain GU: no hematuria, urgency, or frequency Neurologic: negative for visual changes, syncope, headache, or dizziness Heme: no easy bruising or bleeding Endo: negative for excessive thirst, thyroid disorder, or flushing Musculoskeletal: Positive for leg pain  All other systems reviewed and are otherwise negative except as noted above.  Physical Exam: Blood pressure 130/65, pulse 78, temperature 98.3 F (36.8  C), temperature source Oral, resp. rate 18, height 4\' 11"  (1.499 m), weight 226 lb (102.513 kg), SpO2 95 %. Pt is alert and oriented, WD, WN, pleasant obese woman in no distress. HEENT: normal Neck: JVP normal. Carotid upstrokes normal without bruits. No thyromegaly. Lungs: equal expansion, clear bilaterally CV: Apex is discrete and nondisplaced, RRR without murmur or gallop Abd: soft, NT, +BS, no bruit, no hepatosplenomegaly Back: no CVA tenderness Ext: no C/C/E        DP/PT pulses intact and = Skin: warm and dry without rash Neuro: CNII-XII intact             Strength intact = bilaterally    Labs:  Recent Labs  09/28/14 0123 09/28/14 0500  TROPONINI 0.03 <0.03   Lab Results  Component Value Date   WBC 7.5 09/27/2014   HGB 12.9 09/27/2014   HCT 39.3 09/27/2014   MCV 86.2 09/27/2014   PLT 272 09/27/2014    Recent Labs Lab 09/27/14 1930  NA 142  K 3.2*  CL 103  CO2 26  BUN 14  CREATININE 1.11*  CALCIUM 9.5  PROT 6.9  BILITOT 0.2*  ALKPHOS 56  ALT 26  AST 21  GLUCOSE 150*   Lab Results  Component Value Date   CHOL 173 04/23/2011   CHOL 171 04/23/2011   HDL 41 04/23/2011   HDL 40 04/23/2011   LDLCALC 103* 04/23/2011   LDLCALC 102* 04/23/2011   TRIG 147 04/23/2011   TRIG 143 04/23/2011   Lab Results  Component Value Date   DDIMER 0.99* 05/24/2014    Radiology/Studies:  Dg Chest 2 View  09/27/2014   CLINICAL DATA:  Body aches.  EXAM: CHEST   2 VIEW  COMPARISON:  04/27/2014.  FINDINGS: The heart size and mediastinal contours are within normal limits. Both lungs are clear. The visualized skeletal structures are unremarkable.  IMPRESSION: No active cardiopulmonary disease.  Stable appearance from priors.   Electronically Signed   By: Rolla Flatten M.D.   On: 09/27/2014 23:14    EKG: Normal sinus rhythm with nonspecific IVCD and ST/T wave changes consider lateral ischemia  Cardiac Studies: 2D Echo 05/26/2014: Study Conclusions  - Left ventricle: The cavity size was normal. Wall thickness was normal. Systolic function was normal. The estimated ejection fraction was in the range of 60% to 65%. Wall motion was normal; there were no regional wall motion abnormalities. Doppler parameters are consistent with abnormal left ventricular relaxation (grade 1 diastolic dysfunction). The E/e&' ratio is >15, suggesting elevated LV filling pressure. - Mitral valve: Mildly thickened leaflets . There was mild regurgitation. - Left atrium: The atrium was normal in size. - Atrial septum: Bows from left to right, suggesting high LA pressure. There is a possible pendunculated mass (measuring about 7 mm round) attached to the interatrial septum. it is seen going in and out of the echo plane in the left atrium, best in the subcostal views. This may represent myxoma or aneurysmal interatrial septum being seen in and out of plane. No PFO is noted. Consider TEE for further evaluation. - Pulmonic valve: Poorly visualized. There was mild regurgitation. - Inferior vena cava: The vessel was dilated. The respirophasic diameter changes were blunted (< 50%), consistent with elevated central venous pressure.  Impressions:  - Compared to the prior echo in 2013, there is now diasolic dysfunction with elevated LV filling pressure. There is a question of atrial myxoma versus atrial septal aneurysm seen in subcostal views,  Mobile mass measuring <1  cm. Consider TEE to evaluate further.  CT Angio (chest) 05/24/2014: IMPRESSION: Negative for pulmonary embolus.  Cardiomegaly.  Fatty infiltration liver.  Thyromegaly, unchanged.  ASSESSMENT AND PLAN:  1. Chest pain with typical and atypical features, moderate risk of acute coronary syndrome: The patient's history was reviewed. She's had frequent evaluations for chest pain. Cardiac catheterization in 2008 showed normal coronary arteries. She now has nonspecific ST segment changes from previous tracings. I have recommended a pharmacologic Myoview scan to rule out significant ischemia. Last stress test was approximately 2 years ago. She had a CT angiogram of the chest a few months ago demonstrating no evidence of pulmonary embolus. Her initial troponin values are negative.  2. 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 Consider further imaging studies if strong suspicion of myxoma. The finding of atrial septal aneurysm would not require any further evaluation or treatment.  3. Hypertension: The patient is continued on home medications. She needs to work on weight loss and lifestyle modification.  4. Type 2 diabetes: Metformin on hold while the patient is nothing by mouth.  Disposition: Will arrange nuclear stress test and echocardiogram. As long as these studies indicate low cardiac risk, the patient can be discharged home with outpatient follow-up.  SignedSherren Mocha MD, [facility] 09/28/2014, 7:10 AM

## 2014-09-28 NOTE — Progress Notes (Signed)
PROGRESS NOTE    DANDRIA GRIEGO IOE:[id number] DOB: 1960/05/20 DOA: 09/27/2014 PCP: Velna Hatchet, MD  HPI/Brief narrative 54 year old female patient with history of DM 2, HTN, obesity, anxiety/depression, PE 2008-completed Coumadin 6 months, presented to [facility] ED on 09/27/14 with 2-3 days history of lower midsternal chest pain without radiation. Some relief with sublingual NTG. Had some nausea and vomiting yesterday without abdominal pain or diarrhea. Cardiac cath 2008: Normal coronaries. Last nuclear stress test 2014: Negative. Cardiology was consulted. Stress test abnormal. Cardiac cath planned 09/29/14   Assessment/Plan:  Chest pain - With typical and atypical features. - EKG without acute changes and troponin 3 negative - Cardiac 2008 showed normal coronary arteries. Nuclear stress test 2014 negative. - Cardiology was consulted and patient underwent nuclear stress test 6/6 which was abnormal-results as below - Cardiac cath planned for 09/29/14 - Patient has had multiple CTA's chest over the years and the last one in January 2016 was negative for PE - If cath is negative then may consider RUQ ultrasound to rule out hepatobiliary etiology. Of note LFTs and lipase normal. No RUQ tenderness.  Essential hypertension - Controlled - Continue carvedilol, Imdur & Lisinopril  Type II DM - Controlled - Metformin held - NovoLog SSI. 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    DVT prophylaxis: Subcutaneous heparin Code Status: Full Family Communication: None at bedside Disposition Plan: [place] when medically stable   Consultants:  Cardiology  Procedures:  None  Antibiotics:  None   Subjective: Complains of intermittent lower substernal chest discomfort without radiation.  Denies pleuritic symptoms. Nausea and vomiting resolved. Denies abdominal pain or dyspnea.  Objective: Filed Vitals:   09/28/14 1223 09/28/14 1224 09/28/14 1226 09/28/14 1406  BP: 113/74 108/70 107/68 135/90  Pulse: 88 88 86 71  Temp:      TempSrc:      Resp:    19  Height:      Weight:      SpO2:    97%   No intake or output data in the 24 hours ending 09/28/14 1501 Filed Weights   09/28/14 0332  Weight: 102.513 kg (226 lb)     Exam:  General exam: Moderately built and overweight middle-aged female lying comfortably supine in bed Respiratory system: Clear. No increased work of breathing. No reproducible chest wall tenderness. Cardiovascular system: S1 & S2 heard, RRR. No JVD, murmurs, gallops, clicks or pedal edema. Telemetry: Sinus rhythm. Gastrointestinal system: Abdomen is nondistended, soft and nontender. Normal bowel sounds heard. Central nervous system: Alert and oriented. No focal neurological deficits. Extremities: Symmetric 5 x 5 power.   Data Reviewed: Basic Metabolic Panel:  Recent Labs Lab 09/27/14 1930  NA 142  K 3.2*  CL 103  CO2 26  GLUCOSE 150*  BUN 14  CREATININE 1.11*  CALCIUM 9.5   Liver Function Tests:  Recent Labs Lab 09/27/14 1930  AST 21  ALT 26  ALKPHOS 56  BILITOT 0.2*  PROT 6.9  ALBUMIN 3.6    Recent Labs Lab 09/27/14 1930  LIPASE 50   No results for input(s): AMMONIA in the last 168 hours. CBC:  Recent Labs Lab 09/27/14 1930  WBC 7.5  NEUTROABS 3.9  HGB 12.9  HCT 39.3  MCV 86.2  PLT 272   Cardiac Enzymes:  Recent Labs Lab 09/28/14 0123 09/28/14 0500  09/28/14 0712  TROPONINI 0.03 <0.03 <0.03   BNP (last 3 results)  Recent Labs  12/01/13 1439 02/24/14 1231  PROBNP 71.8 36.4   CBG:  Recent Labs Lab 09/28/14 0345 09/28/14 0726  GLUCAP 111* 104*    Recent Results (from the past 240 hour(s))  MRSA PCR Screening     Status: None   Collection Time: 09/28/14  4:02 AM  Result Value Ref Range  Status   MRSA by PCR NEGATIVE NEGATIVE Final    Comment:        The GeneXpert MRSA Assay (FDA approved for NASAL specimens only), is one component of a comprehensive MRSA colonization surveillance program. It is not intended to diagnose MRSA infection nor to guide or monitor treatment for MRSA infections.           Studies: Dg Chest 2 View  09/27/2014   CLINICAL DATA:  Body aches.  EXAM: CHEST  2 VIEW  COMPARISON:  04/27/2014.  FINDINGS: The heart size and mediastinal contours are within normal limits. Both lungs are clear. The visualized skeletal structures are unremarkable.  IMPRESSION: No active cardiopulmonary disease.  Stable appearance from priors.   Electronically Signed   By: Rolla Flatten M.D.   On: 09/27/2014 23:14   Nm Myocar Multi W/spect W/wall Motion / Ef  09/28/2014    Findings consistent with prior myocardial infarction with peri-infarct  ischemia.  The left ventricular ejection fraction is normal (55-65%).  Defect 1: There is a medium defect of mild severity present in the mid  inferolateral, mid anterolateral, apical lateral and apex location.  Suspected interval infarct in the distal lateral wall with suspected  peri-infarct ischemia, especially in apex and anterolateral margin.        Scheduled Meds: . aspirin  81 mg Oral Daily  . atorvastatin  40 mg Oral QHS  . carvedilol  10 mg Oral Daily  . furosemide  40 mg Oral Daily  . gabapentin  600 mg Oral TID  . heparin  5,000 Units Subcutaneous 3 times per day  . isosorbide dinitrate  20 mg Oral BID  . lisinopril  10 mg Oral Daily  . loratadine  10 mg Oral Daily  . pantoprazole  40 mg Oral Daily  . potassium chloride  10 mEq Oral BID  . sertraline  100 mg Oral Daily   Continuous Infusions:   Principal Problem:   Chest pain with moderate risk for cardiac etiology Active Problems:   DM2 (diabetes mellitus, type 2)   HTN (hypertension)    Time spent: 30 minutes.    Vernell Leep, MD, FACP,  FHM. [facility] Pager [phone]  If 7PM-7AM, please contact night-coverage [email or web address] Password [email or web address] 09/28/2014, 3:01 PM

## 2014-09-29 ENCOUNTER — Encounter (HOSPITAL_COMMUNITY): Payer: Self-pay

## 2014-09-29 ENCOUNTER — Observation Stay (HOSPITAL_COMMUNITY): Payer: Medicare Other

## 2014-09-29 DIAGNOSIS — R9430 Abnormal result of cardiovascular function study, unspecified: Secondary | ICD-10-CM | POA: Diagnosis not present

## 2014-09-29 DIAGNOSIS — R112 Nausea with vomiting, unspecified: Secondary | ICD-10-CM | POA: Diagnosis not present

## 2014-09-29 DIAGNOSIS — R079 Chest pain, unspecified: Secondary | ICD-10-CM | POA: Diagnosis not present

## 2014-09-29 LAB — GLUCOSE, CAPILLARY
Glucose-Capillary: 125 mg/dL — ABNORMAL HIGH (ref 65–99)
Glucose-Capillary: 91 mg/dL (ref 65–99)
Glucose-Capillary: 93 mg/dL (ref 65–99)
Glucose-Capillary: 184 mg/dL — ABNORMAL HIGH (ref 65–99)

## 2014-09-29 LAB — PROTIME-INR
INR: 1.09 (ref 0.00–1.49)
Prothrombin Time: 14.3 seconds (ref 11.6–15.2)

## 2014-09-29 MED ORDER — SODIUM CHLORIDE 0.9 % IV SOLN
INTRAVENOUS | Status: DC
  Administered 2014-09-30: 06:00:00 via INTRAVENOUS

## 2014-09-29 NOTE — Evaluation (Addendum)
Physical Therapy Evaluation Patient Details Name: [name] MRN: [id number] DOB: [birth year] Today's Date: 09/29/2014   History of Present Illness  Pt admitted with chest pain, troponins negative awaiting cardiac cath. PMH: heart failure, DM, HTN, obesity  Clinical Impression  Pt pleasant with baseline rather sedentary lifestyle. Pt reports having HHPT a few months ago with education for HEP, walking program and diet. Pt states she currently performs HEP 2x/wk at most and only walks to the mailbox but at times will walk a lap at the [facility] or move around in the pool. Pt states she gets tired and SOB as her limitation for mobility. Discussed healty diet and HEp with pt as well as lifestyle modifications to progress function. Pt with limited strength and activity tolerance who will benefit from acute therapy to maximize mobility, function and gait.     Follow Up Recommendations Home health PT    Equipment Recommendations  None recommended by PT    Recommendations for Other Services       Precautions / Restrictions Precautions Precautions: Fall      Mobility  Bed Mobility Overal bed mobility: Modified Independent                Transfers Overall transfer level: Modified independent                  Ambulation/Gait Ambulation/Gait assistance: Supervision Ambulation Distance (Feet): 60 Feet Assistive device: Rolling walker (2 wheeled) Gait Pattern/deviations: Step-through pattern;Decreased stride length   Gait velocity interpretation: Below normal speed for age/gender General Gait Details: cues for posture, distance limited by fatigue, no CP, HR 64  Stairs            Wheelchair Mobility    Modified Rankin (Stroke Patients Only)       Balance Overall balance assessment: Needs assistance   Sitting balance-Leahy Scale: Good       Standing balance-Leahy Scale: Fair                               Pertinent Vitals/Pain Pain  Assessment: No/denies pain  HR 64 1/4 dyspnea with gait    Home Living Family/patient expects to be discharged to:: Private residence Living Arrangements: Alone Available Help at Discharge: Family Type of Home: Apartment Home Access: Level entry     Home Layout: One level Home Equipment: Environmental consultant - 2 wheels      Prior Function Level of Independence: Independent with assistive device(s)         Comments: walks with RW, feels unsteady, rather sedentary life walking to mailbox as max activity     Hand Dominance        Extremity/Trunk Assessment   Upper Extremity Assessment: Generalized weakness           Lower Extremity Assessment: Generalized weakness      Cervical / Trunk Assessment: Normal  Communication   Communication: No difficulties  Cognition Arousal/Alertness: Awake/alert Behavior During Therapy: WFL for tasks assessed/performed Overall Cognitive Status: Within Functional Limits for tasks assessed                      General Comments      Exercises        Assessment/Plan    PT Assessment Patient needs continued PT services  PT Diagnosis Generalized weakness   PT Problem List Decreased strength;Decreased activity tolerance;Decreased balance;Decreased mobility;Decreased knowledge of use of DME;Obesity  PT Treatment Interventions  Gait training;DME instruction;Functional mobility training;Therapeutic activities;Therapeutic exercise;Patient/family education;Balance training   PT Goals (Current goals can be found in the Care Plan section) Acute Rehab PT Goals Patient Stated Goal: be able to get back to the [facility] to move in the water PT Goal Formulation: With patient Time For Goal Achievement: 10/13/14 Potential to Achieve Goals: Fair    Frequency Min 3X/week   Barriers to discharge Decreased caregiver support      Co-evaluation               End of Session Equipment Utilized During Treatment: Gait belt Activity Tolerance:  Patient tolerated treatment well Patient left: in chair;with call bell/phone within reach Nurse Communication: Mobility status    Functional Assessment Tool Used: clinical judgement Functional Limitation: Mobility: Walking and moving around Mobility: Walking and Moving Around Current Status [phone]): At least 1 percent but less than 20 percent impaired, limited or restricted Mobility: Walking and Moving Around Goal Status [phone]): At least 1 percent but less than 20 percent impaired, limited or restricted    Time: 1300-1326 PT Time Calculation (min) (ACUTE ONLY): 26 min   Charges:   PT Evaluation $Initial PT Evaluation Tier I: 1 Procedure     PT G Codes:   PT G-Codes **NOT FOR INPATIENT CLASS** Functional Assessment Tool Used: clinical judgement Functional Limitation: Mobility: Walking and moving around Mobility: Walking and Moving Around Current Status ([id number]): At least 1 percent but less than 20 percent impaired, limited or restricted Mobility: Walking and Moving Around Goal Status [phone]): At least 1 percent but less than 20 percent impaired, limited or restricted    Melford Aase 09/29/2014, 2:05 PM Elwyn Reach, [place]

## 2014-09-29 NOTE — Progress Notes (Signed)
Paged Baltazar Najjar NP for CPAP order as patient says she wears at night at home. Awaiting orders

## 2014-09-29 NOTE — Progress Notes (Signed)
PROGRESS NOTE    [name] YCX:[id number] DOB: July 31, 1960 DOA: 09/27/2014 PCP: Velna Hatchet, MD  HPI/Brief narrative 54 year old female patient with history of DM 2, HTN, obesity, anxiety/depression, PE 2008-completed Coumadin 6 months, presented to [facility] ED on 09/27/14 with 2-3 days history of lower midsternal chest pain without radiation. Some relief with sublingual NTG. Had some nausea and vomiting yesterday without abdominal pain or diarrhea. Cardiac cath 2008: Normal coronaries. Last nuclear stress test 2014: Negative. Cardiology was consulted. Stress test abnormal. Cardiac cath planned 09/29/14   Assessment/Plan:  Chest pain - With typical and atypical features. - EKG without acute changes and troponin 3 negative - Cardiac 2008 showed normal coronary arteries. Nuclear stress test 2014 negative. - Cardiology was consulted and patient underwent nuclear stress test 6/6 which was abnormal-results as below - Cardiac cath planned for 09/29/14 - Patient has had multiple CTA's chest over the years and the last one in January 2016 was negative for PE - Patient complained of an episode of nausea and vomiting overnight. RUQ ultrasound without acute findings. Of note LFTs and lipase normal. No RUQ tenderness. - Patient had brief chest pain overnight. Resolved. - Cardiology following.  Essential hypertension - Controlled - Continue carvedilol, Imdur & Lisinopril  Type II DM - Controlled - Metformin held - NovoLog SSI.   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 2-D echo: The atrial septum appears aneurysmal and no clear mass identified. As per cardiology follow-up 6/6, atrial septal aneurysm would not require any further evaluation or treatment.    DVT prophylaxis: Subcutaneous heparin Code  Status: Full Family Communication: None at bedside Disposition Plan: [place] when medically stable   Consultants:  Cardiology  Procedures:  2 D Echo 09/28/14: Study Conclusions  - Left ventricle: The cavity size was normal. Wall thickness was normal. Systolic function was normal. The estimated ejection fraction was in the range of 55% to 60%. Wall motion was normal; there were no regional wall motion abnormalities. Doppler parameters are consistent with abnormal left ventricular relaxation (grade 1 diastolic dysfunction). The E/e&' ratio is between 8-15, suggesting indeterminate LV filling pressure. - Atrial septum: The atrial septum appears aneurysmal - no evidence for PFO. No clear mass is identified. - Tricuspid valve: There was trivial regurgitation. Envelope was inadequate to estimate RVSP. - Systemic veins: The IVC measures <2.1 cm, but does not collapse more than 50%, suggesting an elevated RA pressure of 8 mmHg.  Impressions:  - Compared to the prior echo in 05/2014, there are few changes. There is again mobility to the IAS - suggestive of aneurysm. The flickering mobile density seen in the LA in the subcostal views previously is not noted in this study.  Antibiotics:  None   Subjective: Complained of an episode of nausea, vomiting and chest pain overnight-resolved. No chest pain when seen this morning.  Objective: Filed Vitals:   09/29/14 0349 09/29/14 0737 09/29/14 1301 09/29/14 1318  BP: 110/67  142/81   Pulse:   68 64  Temp: 98.1 F (36.7 C) 98.5 F (36.9 C)    TempSrc: Oral Oral    Resp: 14     Height:      Weight: 102.059 kg (225 lb)     SpO2:        Intake/Output Summary (Last 24 hours) at 09/29/14 1614 Last data filed at 09/29/14  1300  Gross per 24 hour  Intake    240 ml  Output    725 ml  Net   -485 ml   Filed Weights   09/28/14 0332 09/29/14 0349  Weight: 102.513 kg (226 lb) 102.059 kg (225 lb)      Exam:  General exam: Moderately built and overweight middle-aged female lying comfortably supine in bed Respiratory system: Clear. No increased work of breathing. No reproducible chest wall tenderness. Cardiovascular system: S1 & S2 heard, RRR. No JVD, murmurs, gallops, clicks or pedal edema. Telemetry: Sinus rhythm with occasional PVCs. Gastrointestinal system: Abdomen is nondistended, soft and nontender. Normal bowel sounds heard. Central nervous system: Alert and oriented. No focal neurological deficits. Extremities: Symmetric 5 x 5 power.   Data Reviewed: Basic Metabolic Panel:  Recent Labs Lab 09/27/14 1930 09/28/14 1646  NA 142 140  K 3.2* 3.5  CL 103 103  CO2 26 26  GLUCOSE 150* 174*  BUN 14 12  CREATININE 1.11* 0.92  CALCIUM 9.5 8.8*   Liver Function Tests:  Recent Labs Lab 09/27/14 1930  AST 21  ALT 26  ALKPHOS 56  BILITOT 0.2*  PROT 6.9  ALBUMIN 3.6    Recent Labs Lab 09/27/14 1930  LIPASE 50   No results for input(s): AMMONIA in the last 168 hours. CBC:  Recent Labs Lab 09/27/14 1930  WBC 7.5  NEUTROABS 3.9  HGB 12.9  HCT 39.3  MCV 86.2  PLT 272   Cardiac Enzymes:  Recent Labs Lab 09/28/14 0123 09/28/14 0500 09/28/14 0712  TROPONINI 0.03 <0.03 <0.03   BNP (last 3 results)  Recent Labs  12/01/13 1439 02/24/14 1231  PROBNP 71.8 36.4   CBG:  Recent Labs Lab 09/28/14 0726 09/28/14 1646 09/28/14 2102 09/29/14 0724 09/29/14 1427  GLUCAP 104* 180* 161* 125* 91    Recent Results (from the past 240 hour(s))  MRSA PCR Screening     Status: None   Collection Time: 09/28/14  4:02 AM  Result Value Ref Range Status   MRSA by PCR NEGATIVE NEGATIVE Final    Comment:        The GeneXpert MRSA Assay (FDA approved for NASAL specimens only), is one component of a comprehensive MRSA colonization surveillance program. It is not intended to diagnose MRSA infection nor to guide or monitor treatment for MRSA infections.            Studies: Dg Chest 2 View  09/27/2014   CLINICAL DATA:  Body aches.  EXAM: CHEST  2 VIEW  COMPARISON:  04/27/2014.  FINDINGS: The heart size and mediastinal contours are within normal limits. Both lungs are clear. The visualized skeletal structures are unremarkable.  IMPRESSION: No active cardiopulmonary disease.  Stable appearance from priors.   Electronically Signed   By: Rolla Flatten M.D.   On: 09/27/2014 23:14   Nm Myocar Multi W/spect W/wall Motion / Ef  09/28/2014    Findings consistent with prior myocardial infarction with peri-infarct  ischemia.  The left ventricular ejection fraction is normal (55-65%).  Defect 1: There is a medium defect of mild severity present in the mid  inferolateral, mid anterolateral, apical lateral and apex location.  Suspected interval infarct in the distal lateral wall with suspected  peri-infarct ischemia, especially in apex and anterolateral margin.   US Abdomen Limited Ruq  09/29/2014   CLINICAL DATA:  Epigastric pain  EXAM: US ABDOMEN LIMITED - RIGHT UPPER QUADRANT  COMPARISON:  None.  FINDINGS: Gallbladder:  No stone, general  wall thickening, or focal tenderness. There is mild focal wasting of the gallbladder lumen which could reflect focal adenomyomatosis as there is subtle ring down artifact.  Common bile duct:  Diameter: 5 mm  Liver:  Probable mild steatosis as there is geographic hypo echogenicity around the gallbladder fossa. No focal mass is seen. Antegrade flow in the imaged portal venous system.  IMPRESSION: No explanation for acute abdominal pain.   Electronically Signed   By: Monte Fantasia M.D.   On: 09/29/2014 12:19        Scheduled Meds: . aspirin  81 mg Oral Daily  . atorvastatin  40 mg Oral QHS  . carvedilol  10 mg Oral Daily  . furosemide  40 mg Oral Daily  . gabapentin  600 mg Oral TID  . heparin  5,000 Units Subcutaneous 3 times per day  . isosorbide dinitrate  20 mg Oral BID  . lisinopril  10 mg Oral Daily  .  loratadine  10 mg Oral Daily  . pantoprazole  40 mg Oral Daily  . potassium chloride  10 mEq Oral BID  . sertraline  100 mg Oral Daily  . sodium chloride  3 mL Intravenous Q12H   Continuous Infusions: . sodium chloride 100 mL/hr at 09/29/14 0500    Principal Problem:   Chest pain with moderate risk for cardiac etiology Active Problems:   Latent syphilis   Dyslipidemia   Morbid obesity-BMI 45   DM2 (diabetes mellitus, type 2)   OSA on CPAP   HTN (hypertension)   Hypokalemia   Abnormal cardiac function test    Time spent: 30 minutes.    Vernell Leep, MD, FACP, FHM. [facility] Pager [phone]  If 7PM-7AM, please contact night-coverage [email or web address] Password [email or web address] 09/29/2014, 4:14 PM

## 2014-09-29 NOTE — Progress Notes (Addendum)
Patient Profile: 54 year old woman with obesity, hypertension, and type 2 diabetes presenting with 2-3 days of waxing and waning chest discomfort.  Subjective: Had one episode of chest pain overnight which resolved with PRN morphine. Not currently having pain, SOB, diaphoresis, or N/V. Anticipating cath today.   Objective: Vital signs in last 24 hours: Temp:  [98.1 F (36.7 C)-98.6 F (37 C)] 98.5 F (36.9 C) (06/07 0737) Pulse Rate:  [68-90] 77 (06/06 2349) Resp:  [14-21] 14 (06/07 0349) BP: (107-135)/(67-90) 110/67 mmHg (06/07 0349) SpO2:  [96 %-97 %] 96 % (06/06 2349) Weight:  [102.059 kg (225 lb)] 102.059 kg (225 lb) (06/07 0349) Last BM Date: 09/27/14  Intake/Output from previous day: 06/06 0701 - 06/07 0700 In: 240 [P.O.:240] Out: 525 [Urine:525] Intake/Output this shift:    Medications Current Facility-Administered Medications  Medication Dose Route Frequency Provider Last Rate Last Dose  . 0.9 %  sodium chloride infusion  250 mL Intravenous PRN Almyra Deforest, PA      . 0.9 %  sodium chloride infusion   Intravenous Continuous Almyra Deforest, PA 100 mL/hr at 09/29/14 0500    . acetaminophen (TYLENOL) tablet 650 mg  650 mg Oral Q4H PRN Etta Quill, DO      . albuterol (PROVENTIL) (2.5 MG/3ML) 0.083% nebulizer solution 3 mL  3 mL Inhalation Q4H PRN Etta Quill, DO      . aspirin chewable tablet 81 mg  81 mg Oral Daily Etta Quill, DO   81 mg at 09/28/14 0935  . atorvastatin (LIPITOR) tablet 40 mg  40 mg Oral QHS Etta Quill, DO   40 mg at 09/28/14 2239  . carvedilol (COREG CR) 24 hr capsule 10 mg  10 mg Oral Daily Etta Quill, DO   10 mg at 09/28/14 1430  . furosemide (LASIX) tablet 40 mg  40 mg Oral Daily Etta Quill, DO   40 mg at 09/28/14 0935  . gabapentin (NEURONTIN) capsule 600 mg  600 mg Oral TID Etta Quill, DO   600 mg at 09/28/14 2239  . heparin injection 5,000 Units  5,000 Units Subcutaneous 3 times per day Etta Quill, DO   5,000 Units  at 09/29/14 0500  . isosorbide dinitrate (ISORDIL) tablet 20 mg  20 mg Oral BID Etta Quill, DO   20 mg at 09/28/14 2239  . lisinopril (PRINIVIL,ZESTRIL) tablet 10 mg  10 mg Oral Daily Etta Quill, DO   10 mg at 09/28/14 0935  . loratadine (CLARITIN) tablet 10 mg  10 mg Oral Daily Etta Quill, DO   10 mg at 09/28/14 0935  . morphine 2 MG/ML injection 2 mg  2 mg Intravenous Q4H PRN Etta Quill, DO   2 mg at 09/29/14 0517  . ondansetron (ZOFRAN) injection 4 mg  4 mg Intravenous Q6H PRN Etta Quill, DO      . pantoprazole (PROTONIX) EC tablet 40 mg  40 mg Oral Daily Etta Quill, DO   40 mg at 09/28/14 0935  . potassium chloride SA (K-DUR,KLOR-CON) CR tablet 10 mEq  10 mEq Oral BID Etta Quill, DO   10 mEq at 09/28/14 2239  . sertraline (ZOLOFT) tablet 100 mg  100 mg Oral Daily Etta Quill, DO   100 mg at 09/28/14 0935  . sodium chloride 0.9 % injection 3 mL  3 mL Intravenous Q12H Almyra Deforest, PA   3 mL at 09/28/14 2239  . sodium  chloride 0.9 % injection 3 mL  3 mL Intravenous PRN Almyra Deforest, PA        PE: General appearance: alert, cooperative, no distress and morbidly obese Neck: no adenopathy, no carotid bruit, supple, symmetrical, trachea midline, thyroid not enlarged, symmetric, no tenderness/mass/nodules and wide, obese neck Lungs: diminished breath sounds LLL and RLL Heart: regular rate and rhythm, S1, S2 normal, no murmur, click, rub or gallop Abdomen: soft, non-tender; bowel sounds normal; no masses,  no organomegaly Extremities: extremities normal, atraumatic, no cyanosis or edema Pulses: 2+ and symmetric Skin: Skin color, texture, turgor normal. No rashes or lesions Neurologic: Grossly normal  Lab Results:   Recent Labs  09/27/14 1930  WBC 7.5  HGB 12.9  HCT 39.3  PLT 272   BMET  Recent Labs  09/27/14 1930 09/28/14 1646  NA 142 140  K 3.2* 3.5  CL 103 103  CO2 26 26  GLUCOSE 150* 174*  BUN 14 12  CREATININE 1.11* 0.92  CALCIUM 9.5 8.8*     PT/INR  Recent Labs  09/29/14 0541  LABPROT 14.3  INR 1.09   Telemetry: NSR, rate 72  Studies/Results: 09/28/14 Nuclear medicine perfusion study: findings consistent with prior MI with peri-infarct ischemia. There is a medium defect of mild severity present in the mid inferolateral, mid anterolateral, apical lateral and apex location. This is new from prior study in 2014. Normal LVEF 55-65%.   Assessment/Plan Principal Problem:   Chest pain with moderate risk for cardiac etiology Active Problems:   DM2 (diabetes mellitus, type 2)   HTN (hypertension)   Hypokalemia  On the board for cath today. Had one episode of atypical CP overnight, will obtain ECG this morning to evaluate for changes. Troponins negative x3. Receiving IVFs for pre-cath hydration; volume status looks good this morning. Renal function normal.    She requires aggressive lifestyle modifications for obesity and diabetes. BSG management per primary team. BP well controlled. She also has a hx of OSA and would benefit from CPAP here in the hospital.    Kerin Ransom, PA-C 09/29/2014 8:35 AM  Not clear to me that her pain is angina but persistant  ECG ok troponin negative  For cath latter today  [name]

## 2014-09-30 ENCOUNTER — Other Ambulatory Visit: Payer: Self-pay

## 2014-09-30 ENCOUNTER — Encounter (HOSPITAL_COMMUNITY): Payer: Self-pay | Admitting: Cardiovascular Disease

## 2014-09-30 ENCOUNTER — Encounter (HOSPITAL_COMMUNITY)
Admission: EM | Disposition: A | Discharge status: Home / Self Care | Payer: Medicare Other | Source: Home / Self Care | Attending: Emergency Medicine

## 2014-09-30 DIAGNOSIS — R079 Chest pain, unspecified: Secondary | ICD-10-CM | POA: Diagnosis not present

## 2014-09-30 DIAGNOSIS — I10 Essential (primary) hypertension: Secondary | ICD-10-CM | POA: Diagnosis not present

## 2014-09-30 DIAGNOSIS — R0602 Shortness of breath: Secondary | ICD-10-CM | POA: Diagnosis not present

## 2014-09-30 DIAGNOSIS — R1030 Lower abdominal pain, unspecified: Secondary | ICD-10-CM | POA: Diagnosis not present

## 2014-09-30 HISTORY — PX: CARDIAC CATHETERIZATION: SHX172

## 2014-09-30 LAB — GLUCOSE, CAPILLARY
Glucose-Capillary: 167 mg/dL — ABNORMAL HIGH (ref 65–99)
Glucose-Capillary: 108 mg/dL — ABNORMAL HIGH (ref 65–99)
Glucose-Capillary: 144 mg/dL — ABNORMAL HIGH (ref 65–99)

## 2014-09-30 SURGERY — LEFT HEART CATH AND CORONARY ANGIOGRAPHY
Anesthesia: LOCAL

## 2014-09-30 MED ORDER — HEPARIN SODIUM (PORCINE) 1000 UNIT/ML IJ SOLN
INTRAMUSCULAR | Status: DC | PRN
  Administered 2014-09-30: 5000 [IU] via INTRAVENOUS

## 2014-09-30 MED ORDER — VERAPAMIL HCL 2.5 MG/ML IV SOLN
INTRAVENOUS | Status: DC | PRN
  Administered 2014-09-30: 10:00:00 via INTRA_ARTERIAL

## 2014-09-30 MED ORDER — SODIUM CHLORIDE 0.9 % IJ SOLN: 3.0000 mL | INTRAMUSCULAR | Status: DC | PRN

## 2014-09-30 MED ORDER — HEPARIN (PORCINE) IN NACL 2-0.9 UNIT/ML-% IJ SOLN
INTRAMUSCULAR | Status: AC
  Filled 2014-09-30: qty 1500

## 2014-09-30 MED ORDER — FENTANYL CITRATE (PF) 100 MCG/2ML IJ SOLN
INTRAMUSCULAR | Status: AC
  Filled 2014-09-30: qty 2

## 2014-09-30 MED ORDER — VERAPAMIL HCL 2.5 MG/ML IV SOLN
INTRAVENOUS | Status: AC
  Filled 2014-09-30: qty 2

## 2014-09-30 MED ORDER — MIDAZOLAM HCL 2 MG/2ML IJ SOLN
INTRAMUSCULAR | Status: DC | PRN
  Administered 2014-09-30: 2 mg via INTRAVENOUS

## 2014-09-30 MED ORDER — MIDAZOLAM HCL 2 MG/2ML IJ SOLN
INTRAMUSCULAR | Status: AC
  Filled 2014-09-30: qty 2

## 2014-09-30 MED ORDER — SODIUM CHLORIDE 0.9 % IJ SOLN
3.0000 mL | Freq: Two times a day (BID) | INTRAMUSCULAR | Status: DC
Start: 2014-09-30 — End: 2014-09-30

## 2014-09-30 MED ORDER — DIPHENHYDRAMINE HCL 25 MG PO CAPS
25.0000 mg | ORAL_CAPSULE | Freq: Once | ORAL | Status: AC
  Administered 2014-09-30: 25 mg via ORAL
  Filled 2014-09-30: qty 1

## 2014-09-30 MED ORDER — HEPARIN SODIUM (PORCINE) 1000 UNIT/ML IJ SOLN
INTRAMUSCULAR | Status: AC
  Filled 2014-09-30: qty 1

## 2014-09-30 MED ORDER — LIDOCAINE HCL (PF) 1 % IJ SOLN
INTRAMUSCULAR | Status: DC | PRN
  Administered 2014-09-30: 5 mL via INTRADERMAL

## 2014-09-30 MED ORDER — NITROGLYCERIN 1 MG/10 ML FOR IR/CATH LAB
INTRA_ARTERIAL | Status: AC
  Filled 2014-09-30: qty 10

## 2014-09-30 MED ORDER — IOHEXOL 350 MG/ML SOLN
INTRAVENOUS | Status: DC | PRN
  Administered 2014-09-30: 45 mL via INTRA_ARTERIAL

## 2014-09-30 MED ORDER — SODIUM CHLORIDE 0.9 % IV SOLN
INTRAVENOUS | Status: AC
Start: 2014-09-30 — End: 2014-09-30

## 2014-09-30 MED ORDER — LIDOCAINE HCL (PF) 1 % IJ SOLN
INTRAMUSCULAR | Status: AC
  Filled 2014-09-30: qty 30

## 2014-09-30 MED ORDER — SODIUM CHLORIDE 0.9 % IV SOLN
250.0000 mL | INTRAVENOUS | Status: DC | PRN
Start: 2014-09-30 — End: 2014-09-30

## 2014-09-30 MED ORDER — FENTANYL CITRATE (PF) 100 MCG/2ML IJ SOLN
INTRAMUSCULAR | Status: DC | PRN
  Administered 2014-09-30: 25 ug via INTRAVENOUS

## 2014-09-30 SURGICAL SUPPLY — 16 items
CATH INFINITI 5 FR JL3.5 (CATHETERS) ×2 IMPLANT
CATH INFINITI 5FR ANG PIGTAIL (CATHETERS) ×2 IMPLANT
CATH INFINITI 5FR MULTPACK ANG (CATHETERS) IMPLANT
CATH INFINITI JR4 5F (CATHETERS) ×2 IMPLANT
DEVICE RAD COMP TR BAND LRG (VASCULAR PRODUCTS) ×2 IMPLANT
ELECT DEFIB PAD ADLT CADENCE (PAD) ×2 IMPLANT
GLIDESHEATH SLEND SS 6F .021 (SHEATH) ×2 IMPLANT
KIT HEART LEFT (KITS) ×2 IMPLANT
PACK CARDIAC CATHETERIZATION (CUSTOM PROCEDURE TRAY) ×2 IMPLANT
SHEATH PINNACLE 5F 10CM (SHEATH) IMPLANT
SYR MEDRAD MARK V 150ML (SYRINGE) ×2 IMPLANT
TRANSDUCER W/STOPCOCK (MISCELLANEOUS) ×2 IMPLANT
TUBING CIL FLEX 10 FLL-RA (TUBING) ×2 IMPLANT
WIRE EMERALD 3MM-J .035X150CM (WIRE) IMPLANT
WIRE HI TORQ VERSACORE-J 145CM (WIRE) ×2 IMPLANT
WIRE SAFE-T 1.5MM-J .035X260CM (WIRE) ×2 IMPLANT

## 2014-09-30 NOTE — Care Management Note (Signed)
Case Management Note  Patient Details  Name: BRANDIN DILDAY MRN: [id number] Date of Birth: [birth year]  Subjective/Objective:   Pt admitted for chest pain. Plan for d/c home today.                  Action/Plan: Pt refusing [facility] RN & OT services. Pt is agreeable to PT services. CM did offer choice and pt chose [facility]. Referral made with [facility] and SOC to begin within 24-48 hrs post d/c. No further needs from CM at this time.    Expected Discharge Date:                  Expected Discharge Plan:  [place]  In-House Referral:     Discharge planning Services  CM Consult  Post Acute Care Choice:    Choice offered to:  Patient  DME Arranged:    DME Agency:     HH Arranged:  PT [place]:  [facility]  Status of Service:     Medicare Important Message Given:  No Date Medicare IM Given:    Medicare IM give by:    Date Additional Medicare IM Given:    Additional Medicare Important Message give by:     If discussed at [place] of Stay Meetings, dates discussed:    Additional Comments:  Bethena Roys, RN 09/30/2014, 1:25 PM

## 2014-09-30 NOTE — Interval H&P Note (Signed)
History and Physical Interval Note:  09/30/2014 10:20 AM  Edwyna Ready  has presented today for surgery, with the diagnosis of abnormal stress test  The various methods of treatment have been discussed with the patient and family. After consideration of risks, benefits and other options for treatment, the patient has consented to  Procedure(s): Left Heart Cath and Coronary Angiography (N/A) as a surgical intervention .  The patient's history has been reviewed, patient examined, no change in status, stable for surgery.  I have reviewed the patient's chart and labs.  Questions were answered to the patient's satisfaction.    Cath Lab Visit (complete for each Cath Lab visit)  Clinical Evaluation Leading to the Procedure:   ACS: No.  Non-ACS:    Anginal Classification: CCS III  Anti-ischemic medical therapy: Minimal Therapy (1 class of medications)  Non-Invasive Test Results: Intermediate-risk stress test findings: cardiac mortality 1-3%/year  Prior CABG: No previous CABG       Sherren Mocha

## 2014-09-30 NOTE — H&P (View-Only) (Signed)
Patient Profile: 54 year old woman with obesity, hypertension, and type 2 diabetes presenting with 2-3 days of waxing and waning chest discomfort.  Subjective: Had one episode of chest pain overnight which resolved with PRN morphine. Not currently having pain, SOB, diaphoresis, or N/V. Anticipating cath today.   Objective: Vital signs in last 24 hours: Temp:  [98.1 F (36.7 C)-98.6 F (37 C)] 98.5 F (36.9 C) (06/07 0737) Pulse Rate:  [68-90] 77 (06/06 2349) Resp:  [14-21] 14 (06/07 0349) BP: (107-135)/(67-90) 110/67 mmHg (06/07 0349) SpO2:  [96 %-97 %] 96 % (06/06 2349) Weight:  [102.059 kg (225 lb)] 102.059 kg (225 lb) (06/07 0349) Last BM Date: 09/27/14  Intake/Output from previous day: 06/06 0701 - 06/07 0700 In: 240 [P.O.:240] Out: 525 [Urine:525] Intake/Output this shift:    Medications Current Facility-Administered Medications  Medication Dose Route Frequency Provider Last Rate Last Dose  . 0.9 %  sodium chloride infusion  250 mL Intravenous PRN Almyra Deforest, PA      . 0.9 %  sodium chloride infusion   Intravenous Continuous Almyra Deforest, PA 100 mL/hr at 09/29/14 0500    . acetaminophen (TYLENOL) tablet 650 mg  650 mg Oral Q4H PRN Etta Quill, DO      . albuterol (PROVENTIL) (2.5 MG/3ML) 0.083% nebulizer solution 3 mL  3 mL Inhalation Q4H PRN Etta Quill, DO      . aspirin chewable tablet 81 mg  81 mg Oral Daily Etta Quill, DO   81 mg at 09/28/14 0935  . atorvastatin (LIPITOR) tablet 40 mg  40 mg Oral QHS Etta Quill, DO   40 mg at 09/28/14 2239  . carvedilol (COREG CR) 24 hr capsule 10 mg  10 mg Oral Daily Etta Quill, DO   10 mg at 09/28/14 1430  . furosemide (LASIX) tablet 40 mg  40 mg Oral Daily Etta Quill, DO   40 mg at 09/28/14 0935  . gabapentin (NEURONTIN) capsule 600 mg  600 mg Oral TID Etta Quill, DO   600 mg at 09/28/14 2239  . heparin injection 5,000 Units  5,000 Units Subcutaneous 3 times per day Etta Quill, DO   5,000 Units  at 09/29/14 0500  . isosorbide dinitrate (ISORDIL) tablet 20 mg  20 mg Oral BID Etta Quill, DO   20 mg at 09/28/14 2239  . lisinopril (PRINIVIL,ZESTRIL) tablet 10 mg  10 mg Oral Daily Etta Quill, DO   10 mg at 09/28/14 0935  . loratadine (CLARITIN) tablet 10 mg  10 mg Oral Daily Etta Quill, DO   10 mg at 09/28/14 0935  . morphine 2 MG/ML injection 2 mg  2 mg Intravenous Q4H PRN Etta Quill, DO   2 mg at 09/29/14 0517  . ondansetron (ZOFRAN) injection 4 mg  4 mg Intravenous Q6H PRN Etta Quill, DO      . pantoprazole (PROTONIX) EC tablet 40 mg  40 mg Oral Daily Etta Quill, DO   40 mg at 09/28/14 0935  . potassium chloride SA (K-DUR,KLOR-CON) CR tablet 10 mEq  10 mEq Oral BID Etta Quill, DO   10 mEq at 09/28/14 2239  . sertraline (ZOLOFT) tablet 100 mg  100 mg Oral Daily Etta Quill, DO   100 mg at 09/28/14 0935  . sodium chloride 0.9 % injection 3 mL  3 mL Intravenous Q12H Almyra Deforest, PA   3 mL at 09/28/14 2239  . sodium  chloride 0.9 % injection 3 mL  3 mL Intravenous PRN Almyra Deforest, PA        PE: General appearance: alert, cooperative, no distress and morbidly obese Neck: no adenopathy, no carotid bruit, supple, symmetrical, trachea midline, thyroid not enlarged, symmetric, no tenderness/mass/nodules and wide, obese neck Lungs: diminished breath sounds LLL and RLL Heart: regular rate and rhythm, S1, S2 normal, no murmur, click, rub or gallop Abdomen: soft, non-tender; bowel sounds normal; no masses,  no organomegaly Extremities: extremities normal, atraumatic, no cyanosis or edema Pulses: 2+ and symmetric Skin: Skin color, texture, turgor normal. No rashes or lesions Neurologic: Grossly normal  Lab Results:   Recent Labs  09/27/14 1930  WBC 7.5  HGB 12.9  HCT 39.3  PLT 272   BMET  Recent Labs  09/27/14 1930 09/28/14 1646  NA 142 140  K 3.2* 3.5  CL 103 103  CO2 26 26  GLUCOSE 150* 174*  BUN 14 12  CREATININE 1.11* 0.92  CALCIUM 9.5 8.8*     PT/INR  Recent Labs  09/29/14 0541  LABPROT 14.3  INR 1.09   Telemetry: NSR, rate 72  Studies/Results: 09/28/14 Nuclear medicine perfusion study: findings consistent with prior MI with peri-infarct ischemia. There is a medium defect of mild severity present in the mid inferolateral, mid anterolateral, apical lateral and apex location. This is new from prior study in 2014. Normal LVEF 55-65%.   Assessment/Plan Principal Problem:   Chest pain with moderate risk for cardiac etiology Active Problems:   DM2 (diabetes mellitus, type 2)   HTN (hypertension)   Hypokalemia  On the board for cath today. Had one episode of atypical CP overnight, will obtain ECG this morning to evaluate for changes. Troponins negative x3. Receiving IVFs for pre-cath hydration; volume status looks good this morning. Renal function normal.    She requires aggressive lifestyle modifications for obesity and diabetes. BSG management per primary team. BP well controlled. She also has a hx of OSA and would benefit from CPAP here in the hospital.    Kerin Ransom, PA-C 09/29/2014 8:35 AM  Not clear to me that her pain is angina but persistant  ECG ok troponin negative  For cath latter today  [name]

## 2014-09-30 NOTE — Discharge Instructions (Signed)
Radial Site Care °Refer to this sheet in the next few weeks. These instructions provide you with information on caring for yourself after your procedure. Your caregiver may also give you more specific instructions. Your treatment has been planned according to current medical practices, but problems sometimes occur. Call your caregiver if you have any problems or questions after your procedure. °HOME CARE INSTRUCTIONS °· You may shower the day after the procedure. Remove the bandage (dressing) and gently wash the site with plain soap and water. Gently pat the site dry. °· Do not apply powder or lotion to the site. °· Do not submerge the affected site in water for 3 to 5 days. °· Inspect the site at least twice daily. °· Do not flex or bend the affected arm for 24 hours. °· No lifting over 5 pounds (2.3 kg) for 5 days after your procedure. °· Do not drive home if you are discharged the same day of the procedure. Have someone else drive you. °· You may drive 24 hours after the procedure unless otherwise instructed by your caregiver. °· Do not operate machinery or power tools for 24 hours. °· A responsible adult should be with you for the first 24 hours after you arrive home. °What to expect: °· Any bruising will usually fade within 1 to 2 weeks. °· Blood that collects in the tissue (hematoma) may be painful to the touch. It should usually decrease in size and tenderness within 1 to 2 weeks. °SEEK IMMEDIATE MEDICAL CARE IF: °· You have unusual pain at the radial site. °· You have redness, warmth, swelling, or pain at the radial site. °· You have drainage (other than a small amount of blood on the dressing). °· You have chills. °· You have a fever or persistent symptoms for more than 72 hours. °· You have a fever and your symptoms suddenly get worse. °· Your arm becomes pale, cool, tingly, or numb. °· You have heavy bleeding from the site. Hold pressure on the site. °Document Released: 05/13/2010 Document Revised:  07/03/2011 Document Reviewed: 05/13/2010 °ExitCare® Patient Information ©2015 [employer]. This information is not intended to replace advice given to you by your health care provider. Make sure you discuss any questions you have with your health care provider. ° °

## 2014-09-30 NOTE — Discharge Summary (Addendum)
Physician Discharge Summary  [name] MRN: [id number] DOB/AGE: 1961/04/19 54 y.o.  PCP: Velna Hatchet, MD   Admit date: 09/27/2014 Discharge date: 09/30/2014  Discharge Diagnoses:     Principal Problem:   Chest pain with moderate risk for cardiac etiology Active Problems:   Latent syphilis   Dyslipidemia   Morbid obesity-BMI 45   DM2 (diabetes mellitus, type 2)   OSA on CPAP   HTN (hypertension)   Hypokalemia   Abnormal cardiac function test   Nausea with vomiting   Chest pain   Follow-up recommendations Follow-up with PCP in 3-5 days PCP to arrange for outpatient sleep study    Medication List    TAKE these medications        albuterol 90 MCG/ACT inhaler  Commonly known as:  PROVENTIL,VENTOLIN  Inhale 2 puffs into the lungs every 4 (four) hours as needed for shortness of breath.     aspirin 81 MG tablet  Take 81 mg by mouth daily.     atorvastatin 40 MG tablet  Commonly known as:  LIPITOR  Take 40 mg by mouth at bedtime.     carvedilol 10 MG 24 hr capsule  Commonly known as:  COREG CR  Take 10 mg by mouth daily.     furosemide 40 MG tablet  Commonly known as:  LASIX  Take 40 mg by mouth daily.     gabapentin 300 MG capsule  Commonly known as:  NEURONTIN  Take 2 capsules (600 mg total) by mouth 3 (three) times daily.     isosorbide dinitrate 20 MG tablet  Commonly known as:  ISORDIL  Take 20 mg by mouth 2 (two) times daily.     KLOR-CON M10 10 MEQ tablet  Generic drug:  potassium chloride  Take 10 mEq by mouth 2 (two) times daily.     KOMBIGLYZE XR 2.08-998 MG Tb24  Generic drug:  Saxagliptin-Metformin  Take 1 tablet by mouth 2 (two) times daily.     lisinopril 10 MG tablet  Commonly known as:  PRINIVIL,ZESTRIL  Take 1 tablet by mouth daily.     loratadine 10 MG tablet  Commonly known as:  CLARITIN  Take 10 mg by mouth daily.     MULTIVITAMIN PO  Take 1 tablet by mouth daily.     nitroGLYCERIN 0.4 MG SL tablet  Commonly  known as:  NITROSTAT  Place 0.4 mg under the tongue every 5 (five) minutes as needed. For chest pain.     pantoprazole 40 MG tablet  Commonly known as:  PROTONIX  Take 1 tablet (40 mg total) by mouth daily.     sertraline 100 MG tablet  Commonly known as:  ZOLOFT  Take 100 mg by mouth daily.        Discharge Condition: *   Disposition: 01-Home or Self Care   Consults:  Cardiology Left heart cath showed   FINAL CONCLUSIONS:  ANGIOGRAPHICALLY NORMAL CORONARY ARTERIES  NORMAL LV FUNCTION  ELEVATED LVEDP      Significant Diagnostic Studies: Dg Chest 2 View  09/27/2014   CLINICAL DATA:  Body aches.  EXAM: CHEST  2 VIEW  COMPARISON:  04/27/2014.  FINDINGS: The heart size and mediastinal contours are within normal limits. Both lungs are clear. The visualized skeletal structures are unremarkable.  IMPRESSION: No active cardiopulmonary disease.  Stable appearance from priors.   Electronically Signed   By: Rolla Flatten M.D.   On: 09/27/2014 23:14   Nm Myocar Multi W/spect  W/wall Motion / Ef  09/28/2014    Findings consistent with prior myocardial infarction with peri-infarct  ischemia.  The left ventricular ejection fraction is normal (55-65%).  Defect 1: There is a medium defect of mild severity present in the mid  inferolateral, mid anterolateral, apical lateral and apex location.  Suspected interval infarct in the distal lateral wall with suspected  peri-infarct ischemia, especially in apex and anterolateral margin.   US Abdomen Limited Ruq  09/29/2014   CLINICAL DATA:  Epigastric pain  EXAM: US ABDOMEN LIMITED - RIGHT UPPER QUADRANT  COMPARISON:  None.  FINDINGS: Gallbladder:  No stone, general wall thickening, or focal tenderness. There is mild focal wasting of the gallbladder lumen which could reflect focal adenomyomatosis as there is subtle ring down artifact.  Common bile duct:  Diameter: 5 mm  Liver:  Probable mild steatosis as there is geographic hypo echogenicity around  the gallbladder fossa. No focal mass is seen. Antegrade flow in the imaged portal venous system.  IMPRESSION: No explanation for acute abdominal pain.   Electronically Signed   By: Monte Fantasia M.D.   On: 09/29/2014 12:19      Microbiology: Recent Results (from the past 240 hour(s))  MRSA PCR Screening     Status: None   Collection Time: 09/28/14  4:02 AM  Result Value Ref Range Status   MRSA by PCR NEGATIVE NEGATIVE Final    Comment:        The GeneXpert MRSA Assay (FDA approved for NASAL specimens only), is one component of a comprehensive MRSA colonization surveillance program. It is not intended to diagnose MRSA infection nor to guide or monitor treatment for MRSA infections.      Labs: Results for orders placed or performed during the hospital encounter of 09/27/14 (from the past 48 hour(s))  Basic metabolic panel     Status: Abnormal   Collection Time: 09/28/14  4:46 PM  Result Value Ref Range   Sodium 140 135 - 145 mmol/L   Potassium 3.5 3.5 - 5.1 mmol/L   Chloride 103 101 - 111 mmol/L   CO2 26 22 - 32 mmol/L   Glucose, Bld 174 (H) 65 - 99 mg/dL   BUN 12 6 - 20 mg/dL   Creatinine, Ser 0.92 0.44 - 1.00 mg/dL   Calcium 8.8 (L) 8.9 - 10.3 mg/dL   GFR calc non Af Amer >60 >60 mL/min   GFR calc Af Amer >60 >60 mL/min    Comment: (NOTE) The eGFR has been calculated using the CKD EPI equation. This calculation has not been validated in all clinical situations. eGFR's persistently <60 mL/min signify possible Chronic Kidney Disease.    Anion gap 11 5 - 15  Glucose, capillary     Status: Abnormal   Collection Time: 09/28/14  4:46 PM  Result Value Ref Range   Glucose-Capillary 180 (H) 65 - 99 mg/dL  Glucose, capillary     Status: Abnormal   Collection Time: 09/28/14  9:02 PM  Result Value Ref Range   Glucose-Capillary 161 (H) 65 - 99 mg/dL  Protime-INR     Status: None   Collection Time: 09/29/14  5:41 AM  Result Value Ref Range   Prothrombin Time 14.3 11.6 -  15.2 seconds   INR 1.09 0.00 - 1.49  Glucose, capillary     Status: Abnormal   Collection Time: 09/29/14  7:24 AM  Result Value Ref Range   Glucose-Capillary 125 (H) 65 - 99 mg/dL  Glucose, capillary  Status: None   Collection Time: 09/29/14  2:27 PM  Result Value Ref Range   Glucose-Capillary 91 65 - 99 mg/dL  Glucose, capillary     Status: None   Collection Time: 09/29/14  4:11 PM  Result Value Ref Range   Glucose-Capillary 93 65 - 99 mg/dL  Glucose, capillary     Status: Abnormal   Collection Time: 09/29/14  9:00 PM  Result Value Ref Range   Glucose-Capillary 184 (H) 65 - 99 mg/dL  Glucose, capillary     Status: Abnormal   Collection Time: 09/30/14  7:33 AM  Result Value Ref Range   Glucose-Capillary 167 (H) 65 - 99 mg/dL  Glucose, capillary     Status: Abnormal   Collection Time: 09/30/14 11:25 AM  Result Value Ref Range   Glucose-Capillary 108 (H) 65 - 99 mg/dL     HPI :54 year old woman with obesity, hypertension, and type 2 diabetes. She presents with approximately 48-72 hours of waxing and waning chest discomfort. She describes a tall intake over the left chest, but also in the upper abdomen. She has had nausea and vomiting. Denies fevers or chills. There is a pleuritic component to her chest pain. There was some transient relief with nitroglycerin. The patient also complains of multiple body aches, shortness of breath, and fatigue. She has had similar symptoms in the past. She's had multiple chest pain evaluations with last nuclear stress test in 2014. Cardiac catheterization in 2008 showed normal coronary arteries. An echocardiogram several months ago showed normal LV systolic function with diastolic dysfunction and question of atrial septal aneurysm versus myxoma  HOSPITAL COURSE:  Chest pain - EKG without acute changes and troponin 3 negative - Cardiac 2008 showed normal coronary arteries. Nuclear stress test 2014 negative. - Cardiology was consulted and patient  underwent nuclear stress test 6/6 which was abnormal-results as below - Cardiac cath showed angiographically normal coronary arteries with normal LV function and elevated LVEDP Cardiology recommends aggressive medical management and lifestyle modifications - Patient has had multiple CTA's chest over the years and the last one in January 2016 was negative for PE - Patient complained of an episode of nausea and vomiting overnight. RUQ ultrasound without acute findings. Of note LFTs and lipase normal. No RUQ tenderness.  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 2-D echo: The atrial septum appears aneurysmal and no clear mass identified. As per cardiology follow-up 6/6, atrial septal aneurysm would not require any further evaluation or treatment.    Discharge Exam:    Blood pressure 140/82, pulse 63, temperature 97.7 F (36.5 C), temperature source Oral, resp. rate 11, height 4' 11" (1.499 m), weight 103.148 kg (227 lb 6.4 oz), SpO2 100 %. General exam: Moderately built and overweight middle-aged female lying comfortably supine in bed Respiratory system: Clear. No increased work of breathing. No reproducible chest wall tenderness. Cardiovascular system: S1 & S2 heard, RRR. No JVD, murmurs, gallops, clicks or pedal edema. Telemetry: Sinus rhythm with occasional PVCs. Gastrointestinal system: Abdomen is nondistended, soft and nontender. Normal bowel sounds heard. Central nervous system: Alert and oriented. No focal neurological deficits. Extremities: Symmetric 5  x 5  power.         Discharge Instructions    Diet - low sodium heart healthy    Complete by:  As directed      Increase activity slowly    Complete by:  As directed            Follow-up Information    Schedule an appointment as soon as possible for a visit with Velna Hatchet, MD.   Specialty:  Internal Medicine   Contact information:   [address] [place] [address] [phone]       Follow up with Velna Hatchet, MD.   Specialty:  Internal Medicine   Contact information:   [address] [place] [address] [phone]       Follow up with Velna Hatchet, MD.   Specialty:  Internal Medicine   Contact information:   [address] [place] [address] [phone]       Follow up with Velna Hatchet, MD.   Specialty:  Internal Medicine   Contact information:   [address] [place] [address] [phone]       Follow up with Velna Hatchet, MD. Schedule an appointment as soon as possible for a visit in 3 days.   Specialty:  Internal Medicine   Contact information:   [place] [address] [phone]       Signed: Reyne Dumas 09/30/2014, 12:07 PM

## 2014-10-01 ENCOUNTER — Other Ambulatory Visit (HOSPITAL_COMMUNITY): Payer: Medicare Other

## 2014-10-01 MED FILL — Heparin Sodium (Porcine) 2 Unit/ML in Sodium Chloride 0.9%: INTRAMUSCULAR | Qty: 1500 | Status: AC

## 2014-10-22 ENCOUNTER — Other Ambulatory Visit: Payer: Self-pay | Admitting: Internal Medicine

## 2014-11-05 ENCOUNTER — Ambulatory Visit (INDEPENDENT_AMBULATORY_CARE_PROVIDER_SITE_OTHER): Payer: Medicare Other | Admitting: Podiatry

## 2014-11-05 ENCOUNTER — Encounter: Payer: Self-pay | Admitting: Podiatry

## 2014-11-05 VITALS — BP 127/69 | HR 75 | Resp 18

## 2014-11-05 DIAGNOSIS — L03031 Cellulitis of right toe: Secondary | ICD-10-CM | POA: Diagnosis not present

## 2014-11-05 DIAGNOSIS — L600 Ingrowing nail: Secondary | ICD-10-CM

## 2014-11-05 DIAGNOSIS — E119 Type 2 diabetes mellitus without complications: Secondary | ICD-10-CM | POA: Diagnosis not present

## 2014-11-05 MED ORDER — CEPHALEXIN 500 MG PO CAPS: 500.0000 mg | ORAL_CAPSULE | Freq: Two times a day (BID) | ORAL | Status: DC

## 2014-11-05 NOTE — Progress Notes (Signed)
   Subjective:    Patient ID: [name], female    DOB: 11-27-1960, 54 y.o.   MRN: [id number]  HPI I HAVE SOME INGROWNS ON BOTH OF MY BIG TOES AND THEY HAVE DRAINED SOME AND THEY ARE SORE AND TENDER, THE NAILS COME BACK AND DR Prudence Davidson DID THE RIGHT AND THE LEFT WAS DONE BY ANOTHER DOCTOR This patient returns saying she has deve She desires evaluation and treatment.loped nails growing out of her big toes at the site of previous nail surgery.  She says there is bail growing from under the cuticle in right big toe which causes deep pain.  She says she has similar problem on inside border left big toe but spicule is present.   Review of Systems  Constitutional: Positive for fatigue.  HENT: Positive for sinus pressure.   Respiratory: Positive for wheezing.   Cardiovascular:       CALF PAIN WITH WALKING  Gastrointestinal: Positive for constipation.  Endocrine: Positive for heat intolerance.  Musculoskeletal: Positive for back pain.       JOINT PAIN  Neurological: Positive for light-headedness and numbness.  Psychiatric/Behavioral: The patient is nervous/anxious.   All other systems reviewed and are negative.      Objective:   Physical Exam Objective: Review of past medical history, medications, social history and allergies were performed.  Vascular: Dorsalis pedis and posterior tibial pulses were palpable B/L, capillary refill was  WNL B/L, temperature gradient was WNL B/L   Skin:  No signs of symptoms  ulcers on both feet  Nails: redness along the medial border right big toe in absence of spicule.  Nail spicule noted medial border left big toe.  Sensory: Thornell Mule monifilament WNL   Orthopedic: Orthopedic evaluation demonstrates all joints distal t ankle have full ROM without crepitus, muscle power WNL B/L        Assessment & Plan:

## 2014-11-14 IMAGING — MR MR CERVICAL SPINE W/O CM
4 of 5 series · 26 of 48 positions shown · non-contrast
Comparison: Face CT 05/28/2010.

CLINICAL DATA: 52-year-old female with neck and shoulder pain right
greater than left. Right arm pain.

EXAM:
MRI CERVICAL SPINE WITHOUT CONTRAST
TECHNIQUE: Multiplanar, multisequence MR imaging was performed. No intravenous
contrast was administered.

[Series 3: T2 · sagittal · 3.3mm · 0.37mm/px · 8 of 12 slices shown (1 of 2)]
[im 1/12]
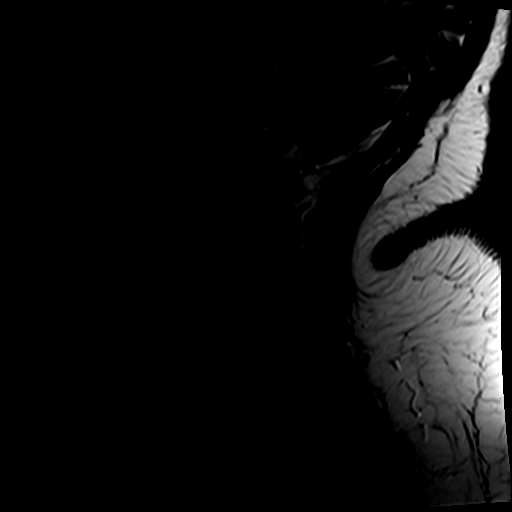
[im 2/12]
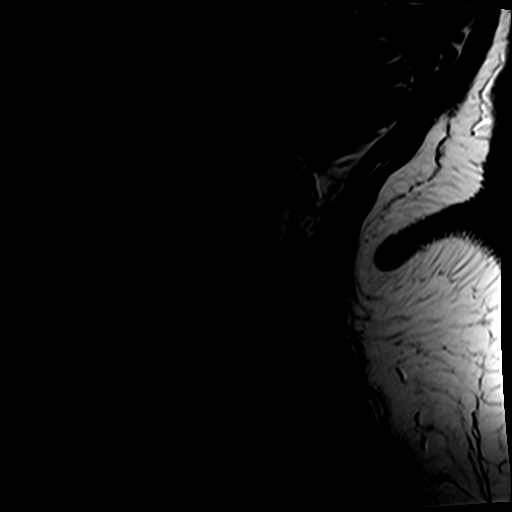
[im 4/12]
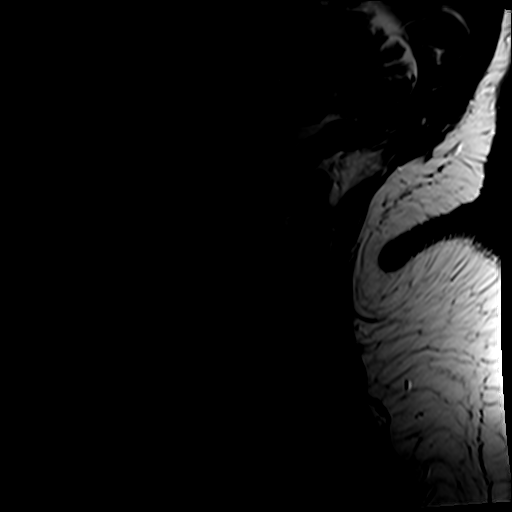
[im 5/12]
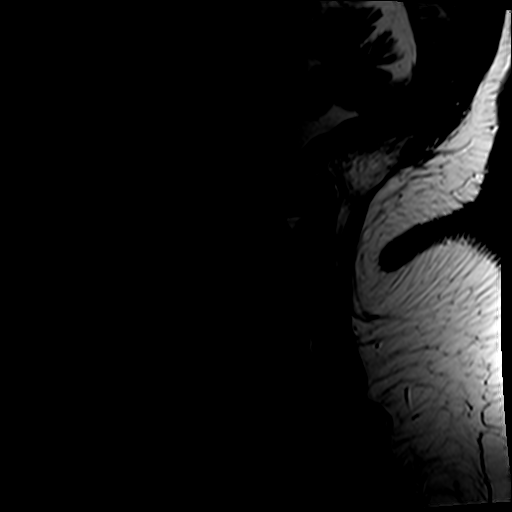
[im 7/12]
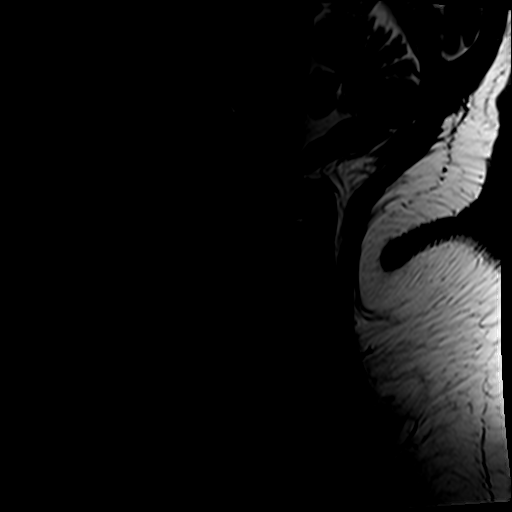
[im 8/12]
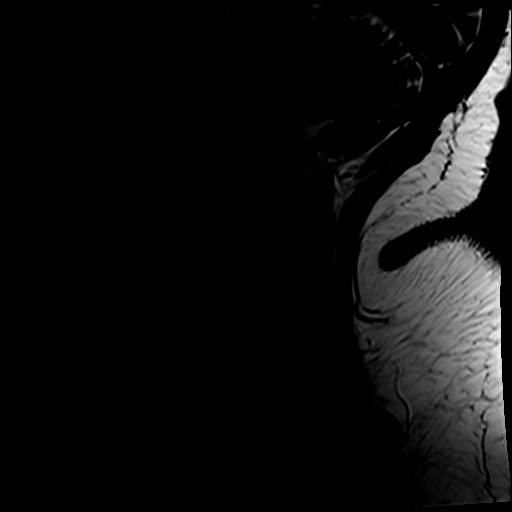
[im 10/12]
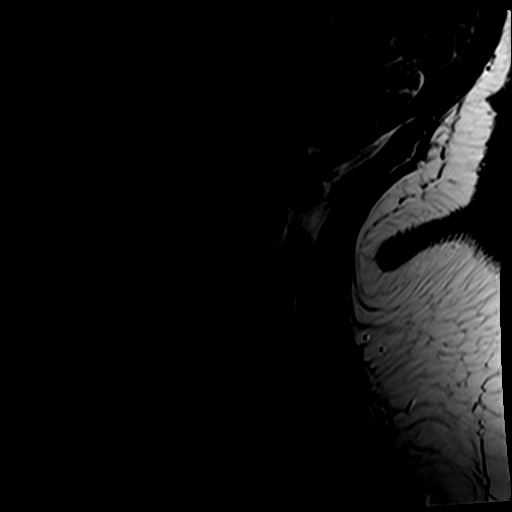
[im 12/12]
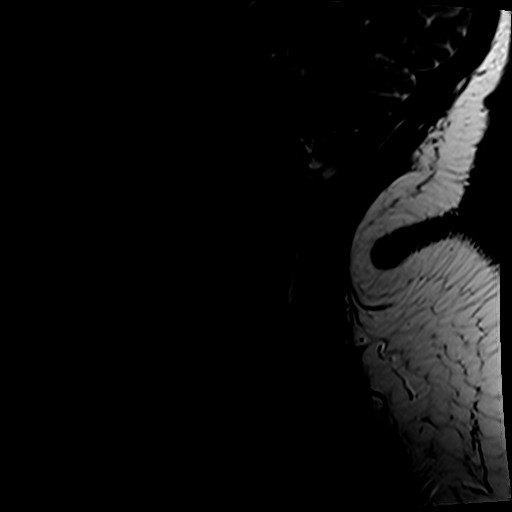

[Series 4: T1 · sagittal · 3.3mm · 0.37mm/px · 6 of 12 slices shown]
[im 1/12]
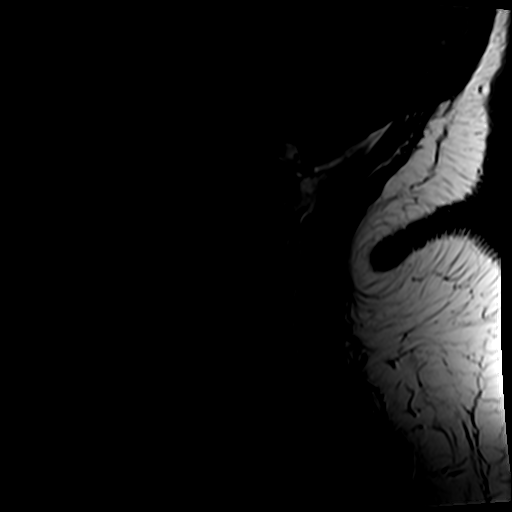
[im 2/12]
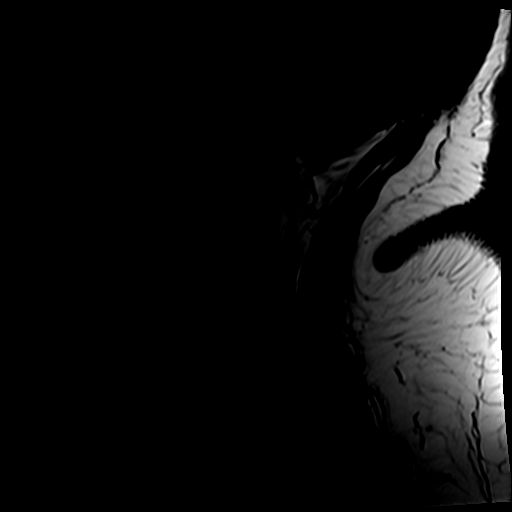
[im 4/12]
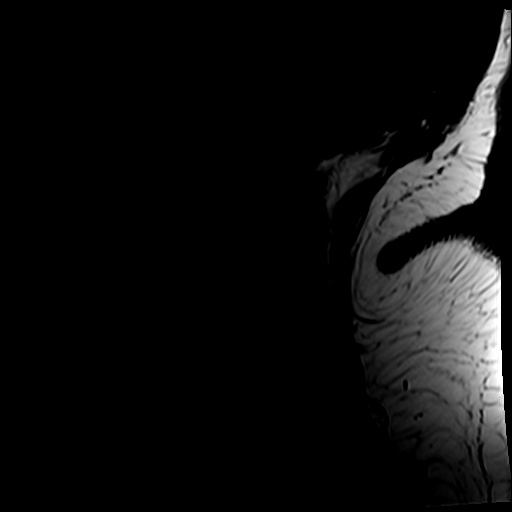
[im 6/12]
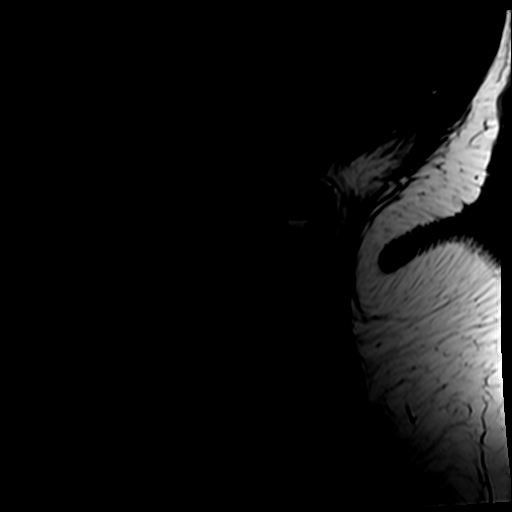
[im 8/12]
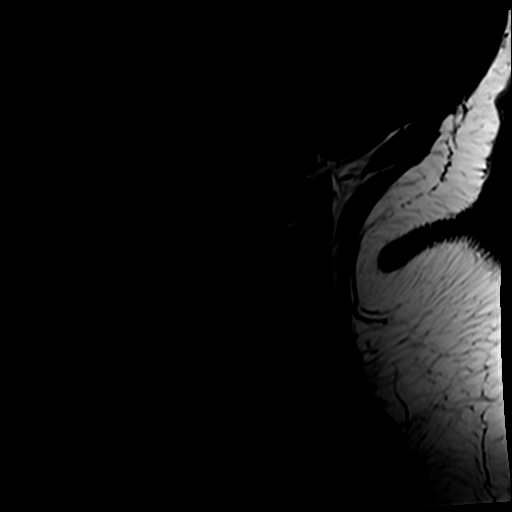
[im 10/12]
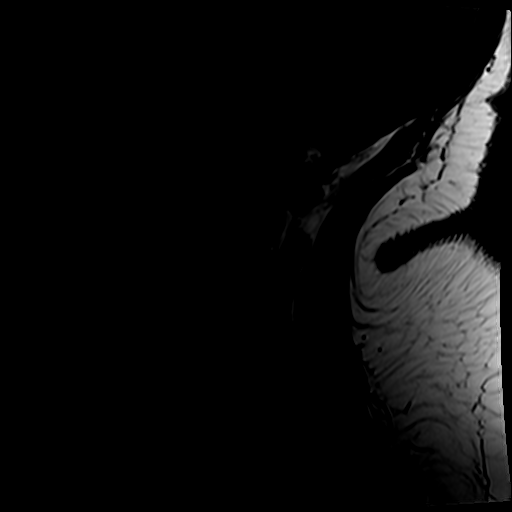

[Series 5: STIR · sagittal · 3.3mm · 0.49mm/px · 3 of 12 slices shown]
[im 2/12]
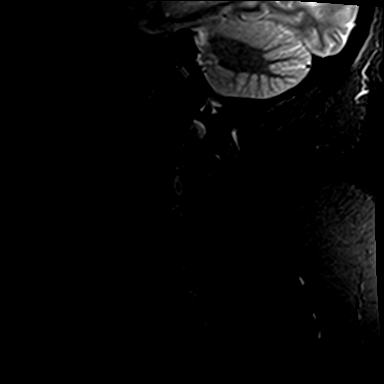
[im 6/12]
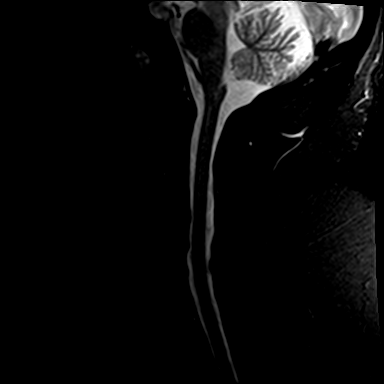
[im 10/12]
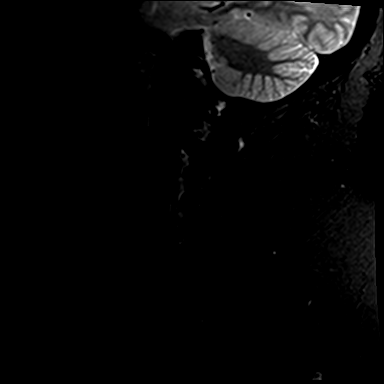

[Series 7: T2 · axial · 3.0mm · 0.74mm/px · z∈[-97,-23]mm · 9 of 22 slices shown (2 of 2)]
[im 1/22]
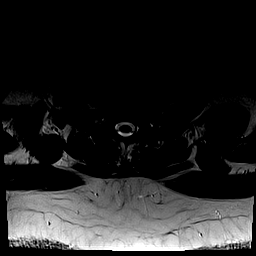
[im 4/22]
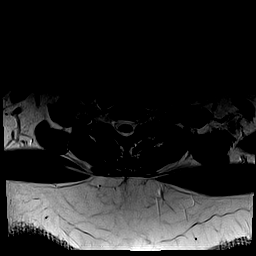
[im 8/22]
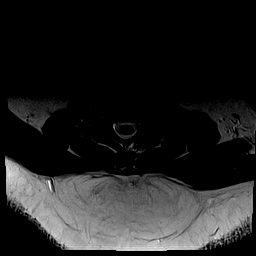
[im 9/22]
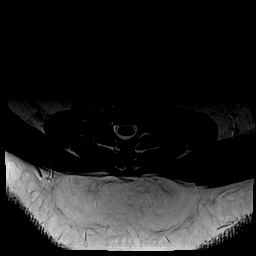
[im 11/22]
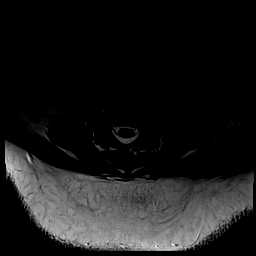
[im 13/22]
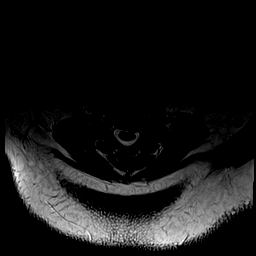
[im 15/22]
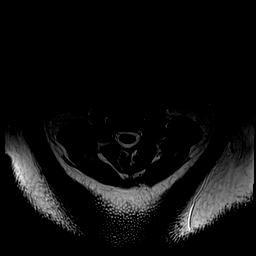
[im 18/22]
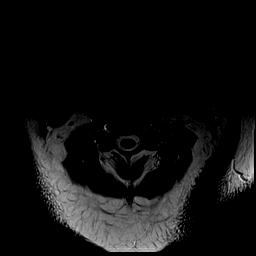
[im 22/22]
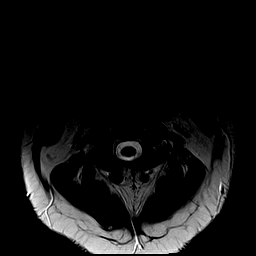

[26 of 48 positions shown; findings below may reference images not displayed]

FINDINGS: Large body habitus. Chronic straightening/ mild reversal of cervical
lordosis. No marrow edema or evidence of acute osseous abnormality.

Chronic partially empty sella configuration verses intra sellar cyst
(series 3, image 5). Cervicomedullary junction is within normal
limits. Spinal cord signal is within normal limits at all visualized
levels.

Visualized paraspinal soft tissues are within normal limits.

Capacious cervical spinal canal.

C2-C3: Negative.

C3-C4: Negative.

C4-C5: Left eccentric circumferential disc osteophyte complex and
left uncovertebral hypertrophy. Mild left facet hypertrophy. No
spinal stenosis. Moderate to severe left C5 foraminal stenosis.
There is mild right foraminal stenosis as well.

C5-C6: Left eccentric circumferential disc osteophyte complex. Left
facet and uncovertebral hypertrophy. Severe left C6 foraminal
stenosis.

C6-C7: Circumferential disc osteophyte complex eccentric to the
right. Right uncovertebral hypertrophy. Moderate to severe right C7
foraminal stenosis.

C7-T1: Mild left facet hypertrophy. No stenosis.

Negative visualized upper thoracic levels except for mild facet
hypertrophy.
IMPRESSION: 1. Multilevel cervical disc and endplate degeneration, but no
cervical spinal stenosis.

2. There Is multifactorial moderate or severe neural foraminal
stenosis at the left C5 , left C6, and right C7 nerve levels.

3. Partially empty sella configuration verses cystic pituitary
lesion. If there is an endocrinopathy in this patient, consider
followup pituitary protocol brain MRI without and with contrast to
further evaluate.

## 2014-12-01 IMAGING — MR MR HEAD WO/W CM
9 of 19 series · 21 of 48 positions shown · IV contrast (multihance)
Comparison: MRI of the cervical spine January 09, 2013.

CLINICAL DATA: Benign neoplasm of pituitary gland.

MRI HEAD WITHOUT AND WITH CONTRAST
TECHNIQUE: Multiplanar, multiecho pulse sequences of the brain and
surrounding structures were obtained according to standard protocol
without and with intravenous contrast
Contrast: 10mL MULTIHANCE GADOBENATE DIMEGLUMINE 529 MG/ML IV SOLN

[Series 2: t1_se_sag · sagittal · 5.0mm · 0.45mm/px · 1 of 19 slices shown]
[im 1/19]
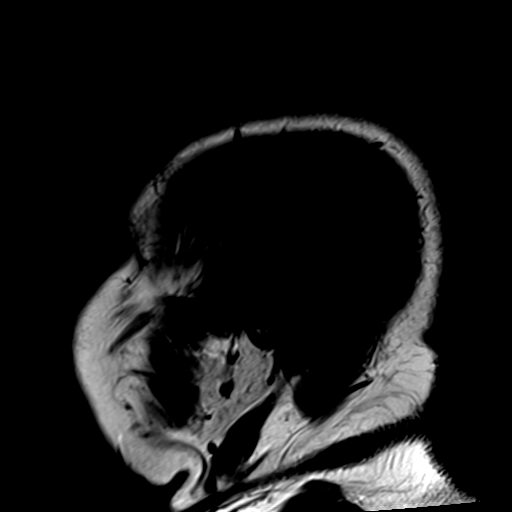

[Series 5: T2 · axial · 5.0mm · 0.45mm/px · z∈[-77,+52]mm · 3 of 21 slices shown]
[im 1/21]
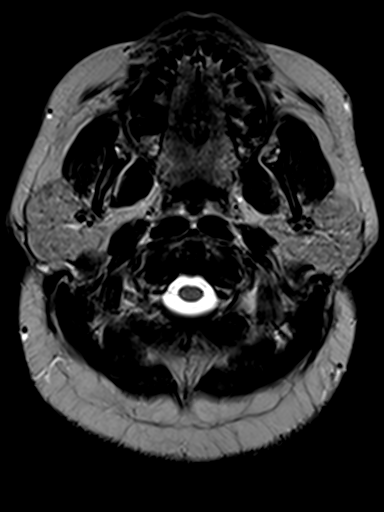
[im 11/21]
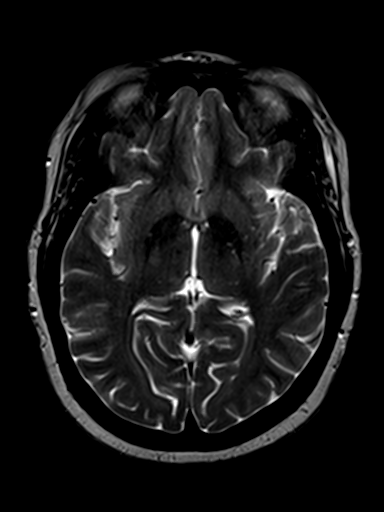
[im 21/21]
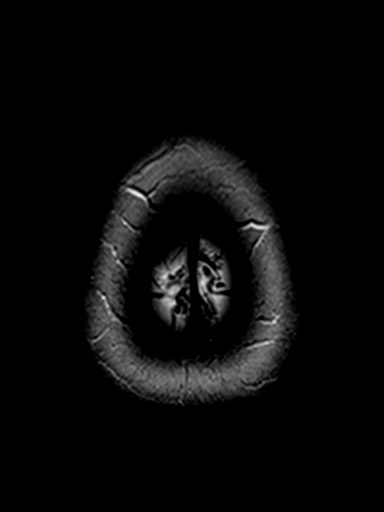

[Series 6: FLAIR · axial · 5.0mm · 0.45mm/px · z∈[-78,+51]mm · 3 of 21 slices shown]
[im 1/21]
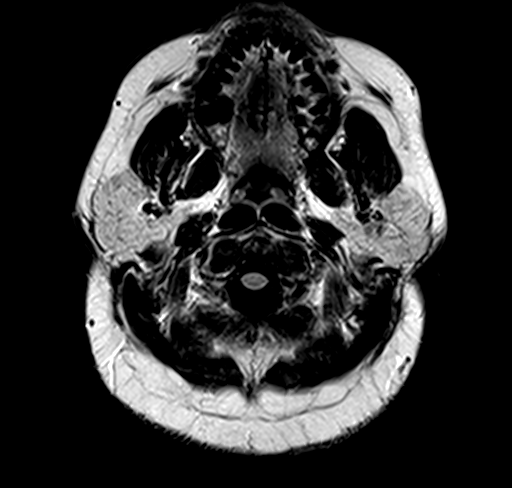
[im 11/21]
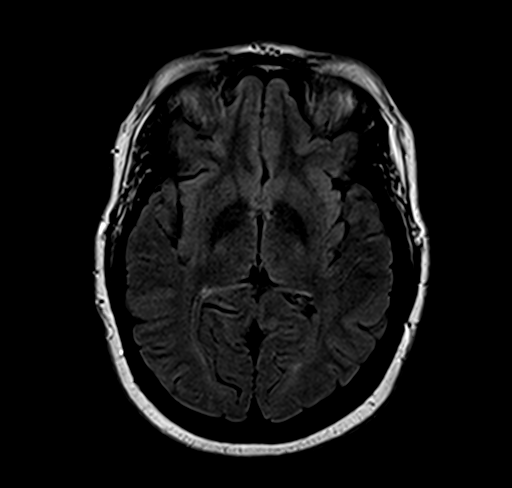
[im 21/21]
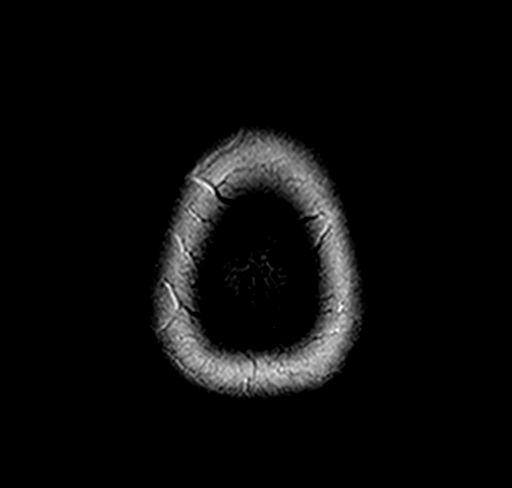

[Series 7: GRE · axial · 5.0mm · 0.45mm/px · z∈[-77,+52]mm · 3 of 21 slices shown]
[im 1/21]
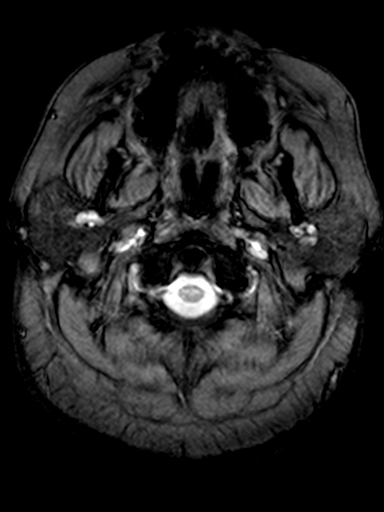
[im 11/21]
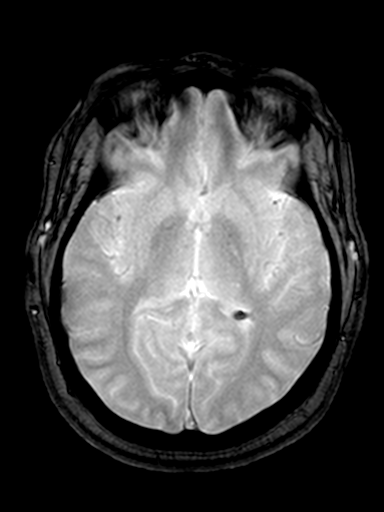
[im 21/21]
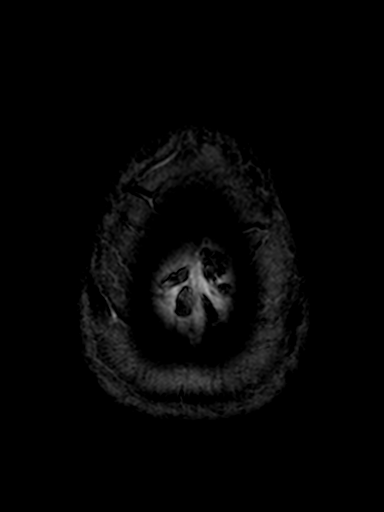

[Series 8: T1 · sagittal · 3.0mm · 0.35mm/px · 2 of 11 slices shown (1 of 2)]
[im 1/11]
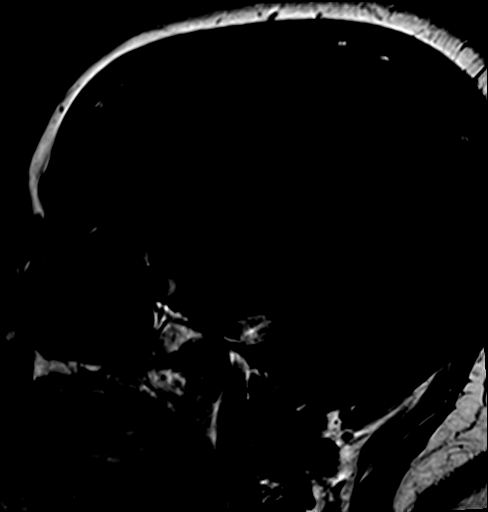
[im 11/11]
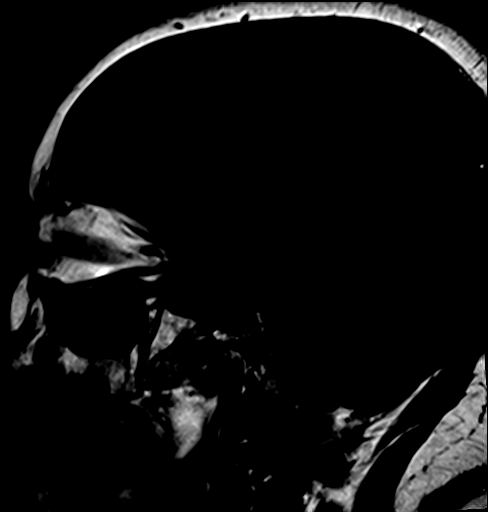

[Series 9: T1 · coronal · 3.0mm · 0.35mm/px · 2 of 11 slices shown (2 of 2)]
[im 1/11]
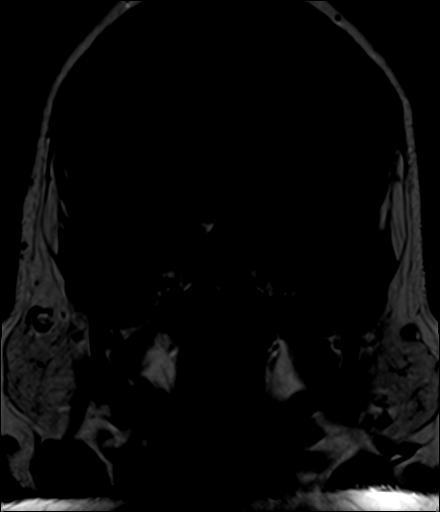
[im 11/11]
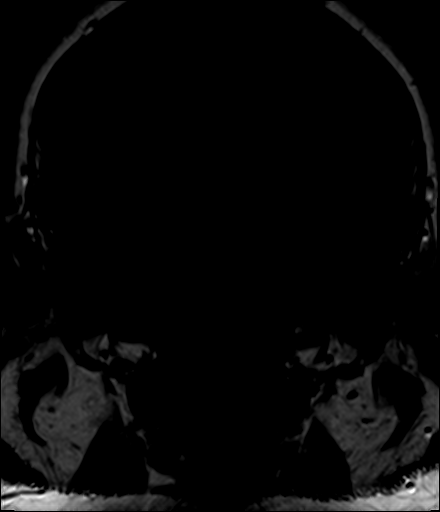

[Series 17: T1 post-contrast · coronal · 3.0mm · 0.35mm/px · 2 of 11 slices shown (1 of 3)]
[im 1/11]
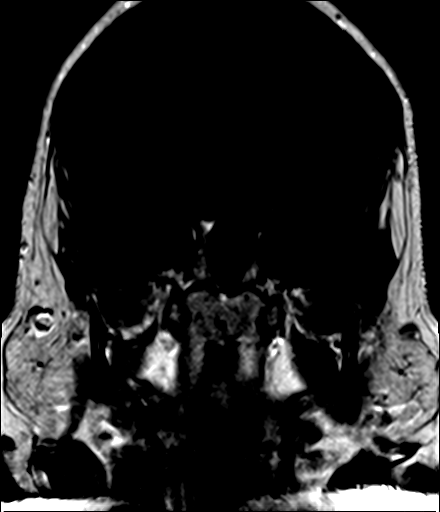
[im 11/11]
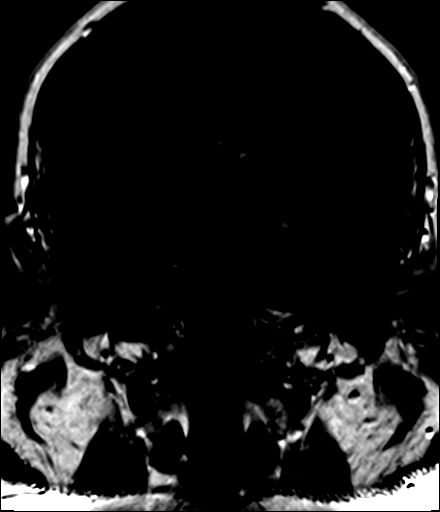

[Series 18: T1 post-contrast · sagittal · 3.0mm · 0.35mm/px · 2 of 11 slices shown (2 of 3)]
[im 1/11]
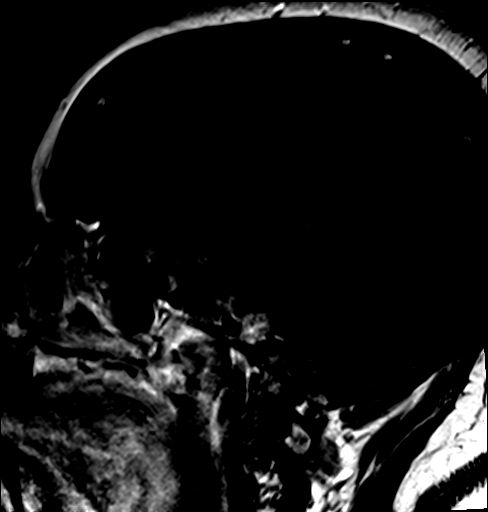
[im 11/11]
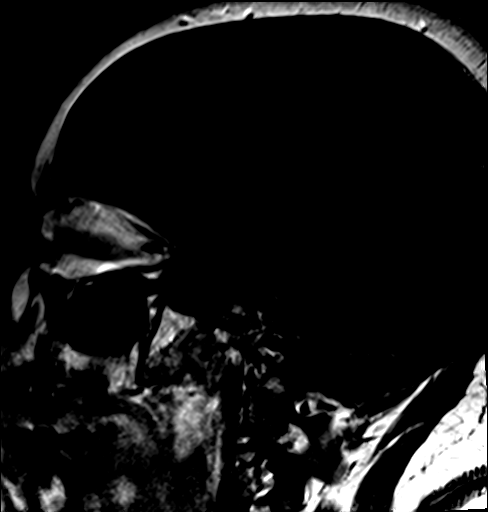

[Series 20: T1 post-contrast · coronal · 5.0mm · 0.43mm/px · 3 of 22 slices shown (3 of 3)]
[im 1/22]
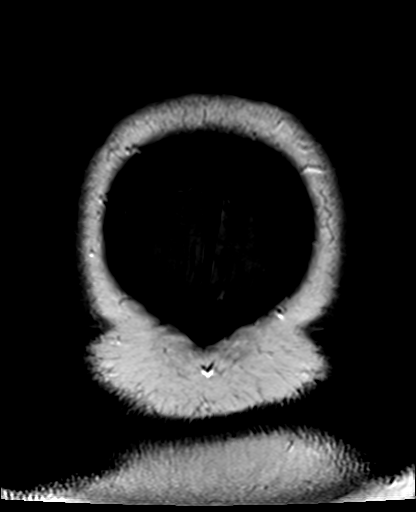
[im 11/22]
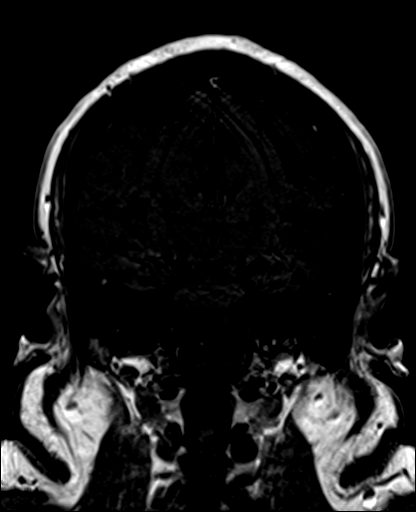
[im 22/22]
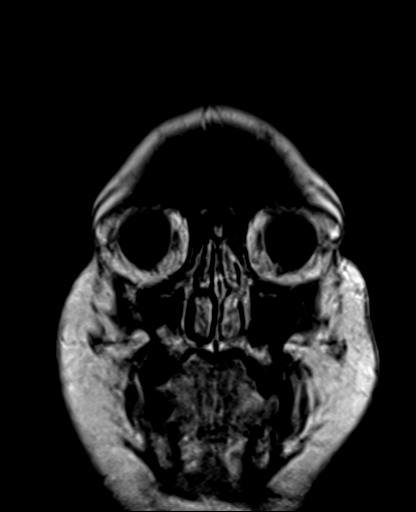

[21 of 48 positions shown; findings below may reference images not displayed]

FINDINGS: The pituitary gland is prominent extending
supracerebellar cistern by low T1, bright T2 signal lesion . The
lesion is hypoenhancing, and does not enhance centrally with
delayed images though, there is a rim of normal enhancing pituitary
tissue.  The right sellar cystic lesion measures 11 x 7 x 10 mm (AP
by transverse by cc) and mildly bows the right cavernous sinus
without definite invasion.  The sella is not frankly expanded.  The
pituitary infundibulum is displaced to the left, mildly
foreshortened.  There is no definite mass effect on the overlying
optic chiasm.

The ventricles and sulci are normal.  No abnormal parenchymal
signal, mass lesions, mass effect nor abnormal parenchymal
enhancement.  No reduced diffusion to suggest acute ischemia.  No
susceptibility artifact to suggest hemorrhage.  A few punctate
supratentorial white matter FLAIR T2 hyperintensities are less than
expected for age in a nonspecific suggest sequelae of chronic small
vessel ischemic disease.

No abnormal extra-axial fluid collections, leptomeningeal
enhancement or extra-axial masses.  Normal major intracranial
vascular flow voids seen at the skull base.

Ocular globes and orbital contents are unremarkable but not
tailored for evaluation.  No paranasal sinus air-fluid levels.  At
least mild temporomandibular osteoarthrosis.  Craniocervical
junction maintained.  No suspicious calvarial bone marrow signal.
IMPRESSION: 7 x 11 x 10 mm right sellar cystic lesion with imaging
characteristics most consistent with a Rathke's cleft cyst, no
suspicious features.

Otherwise unremarkable MRI of the brain with without contrast  for
age.

## 2015-01-17 IMAGING — CR DG ANKLE COMPLETE 3+V*L*
3 series · 3 of 3 positions shown · non-contrast
Comparison: None.

CLINICAL DATA: Left foot and ankle pain.  No known injury.

EXAM:
LEFT ANKLE COMPLETE - 3+ VIEW

[view not recorded (1 of 3)]
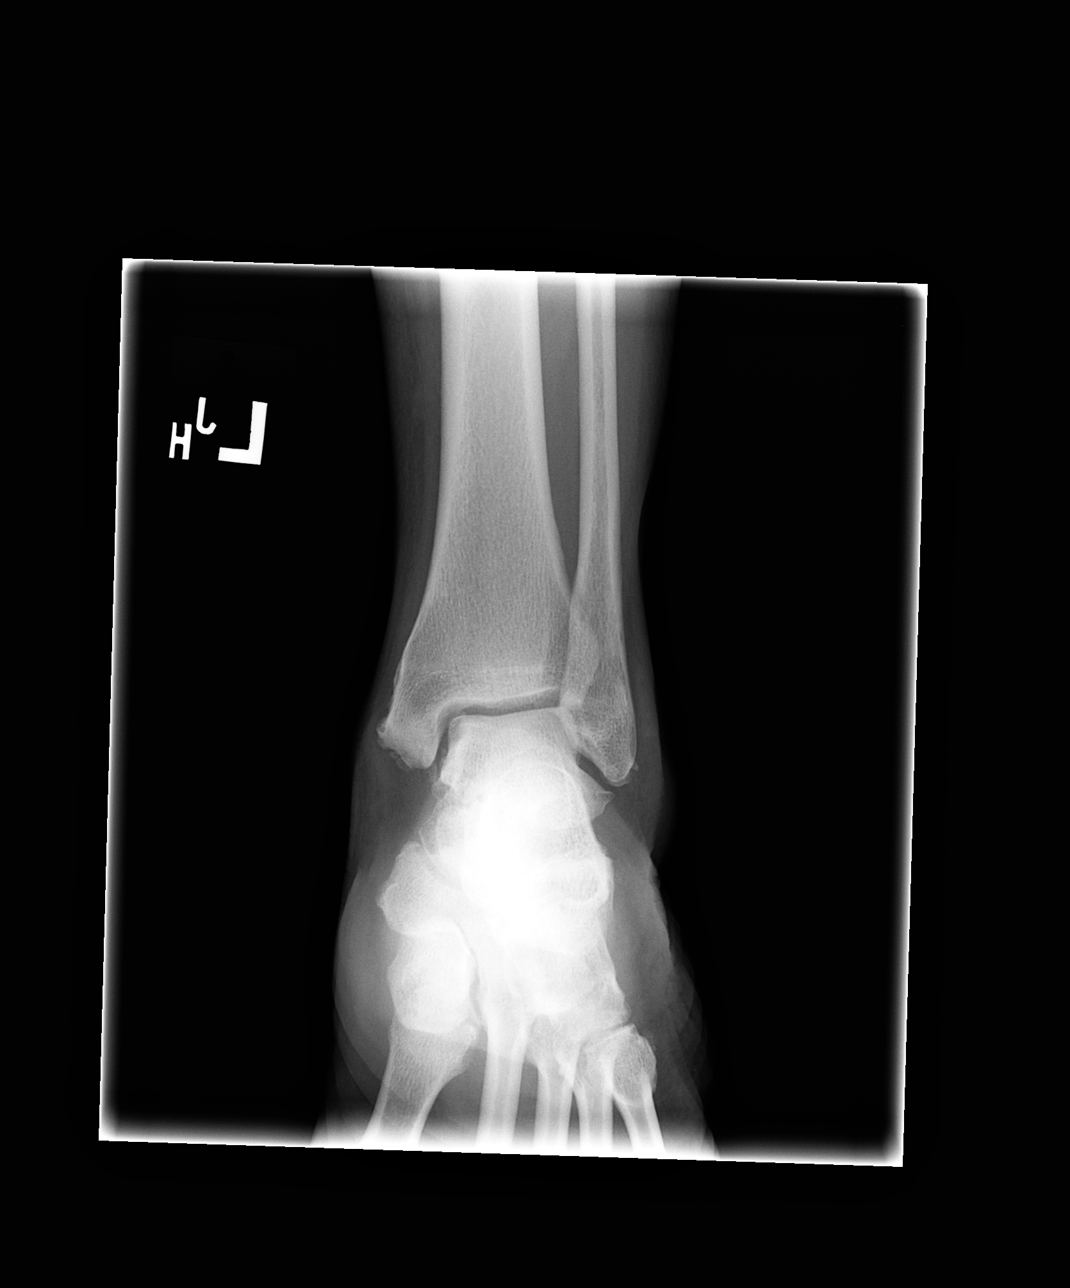

[view not recorded (2 of 3)]
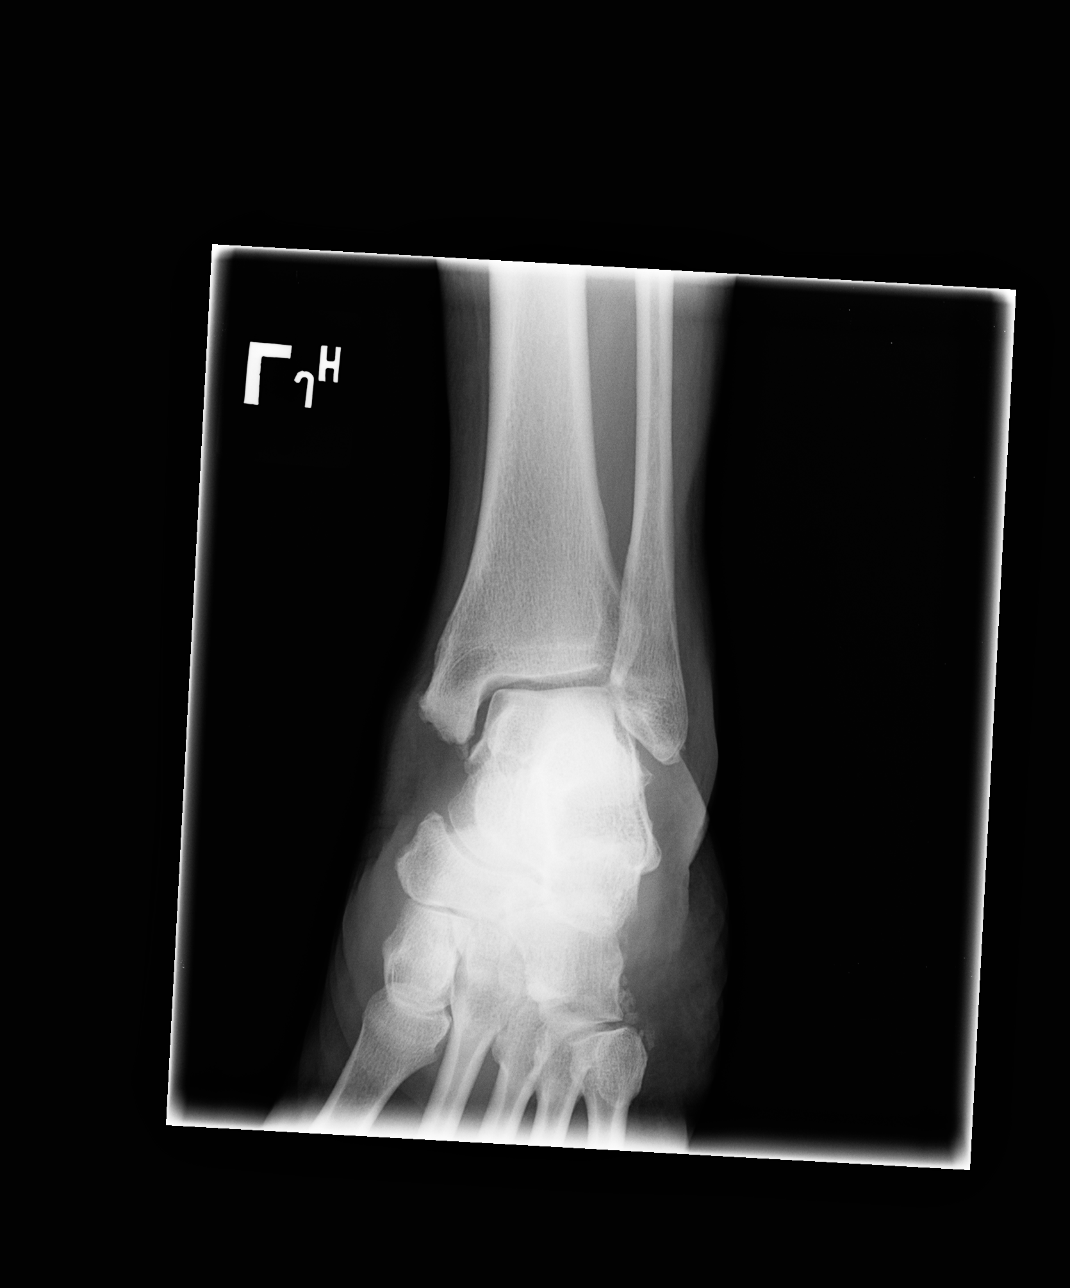

[view not recorded (3 of 3)]
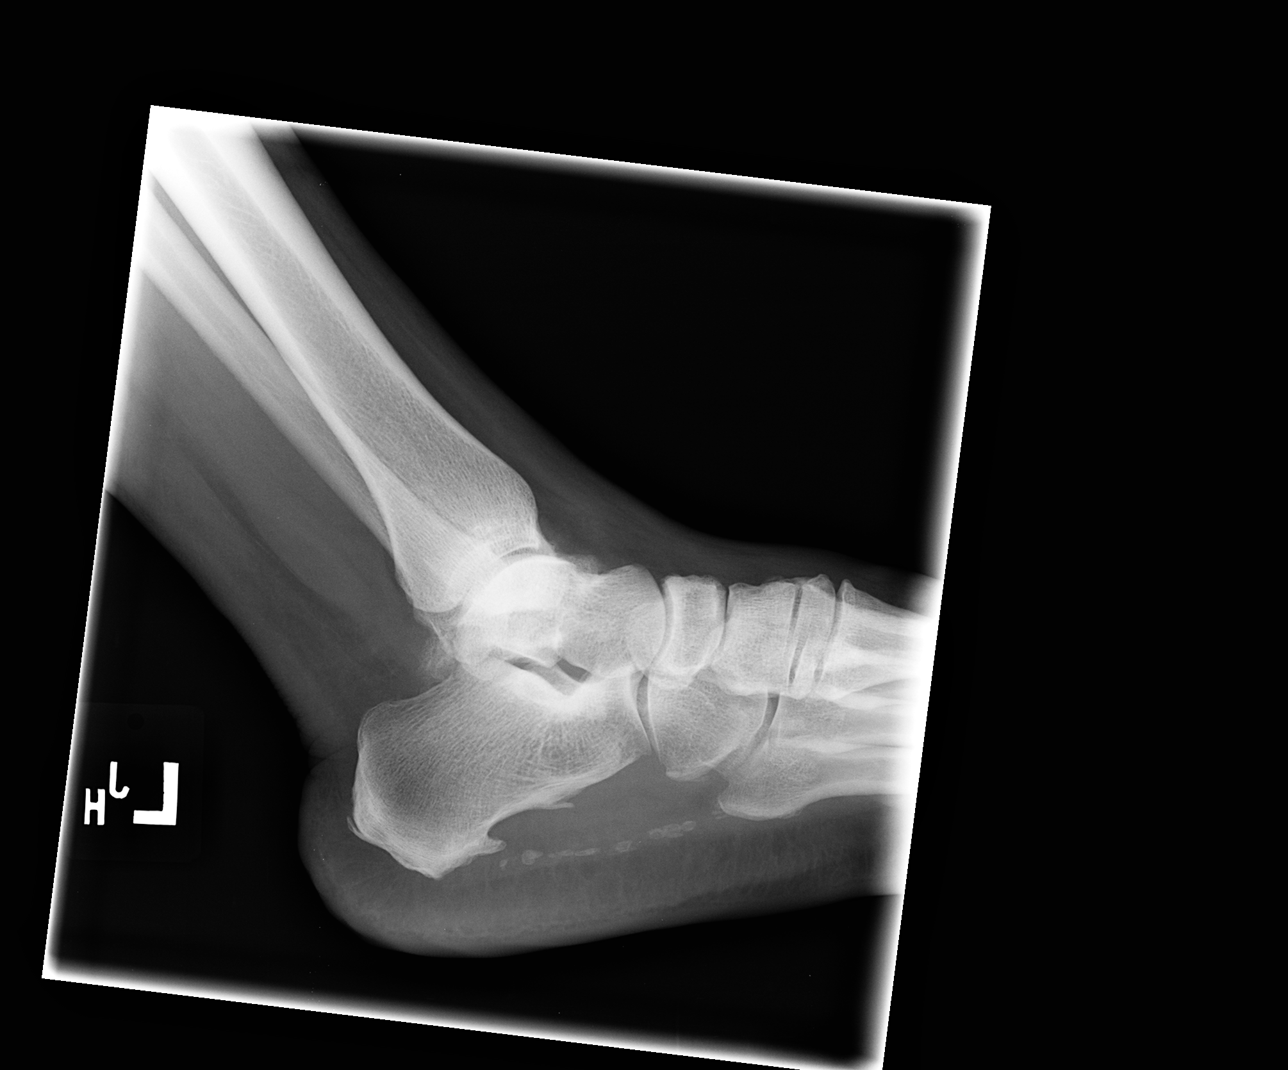

[3 of 3 positions shown; findings below may reference images not displayed]

FINDINGS: Multiple small spurs and area of hyperostosis arising from the
distal medial and lateral malleoli. There is also a lateral talar
spur at the level of the distal lateral malleolus. Small, corticated
ossicles or old bone fragments between the medial malleolus and
talus. There are also inferior and posterior calcaneal spurs and
hyperostosis of the mid calcaneus, inferiorly. Also noted are
extensive plantar fascia calcifications/ossification.
IMPRESSION: Degenerative changes and possible changes of DISH involving the
ankle and proximal foot, as described above, as well as changes of
chronic plantar fasciitis.

## 2015-01-17 IMAGING — CR DG TIBIA/FIBULA 2V*L*
2 series · 2 of 2 positions shown · non-contrast
Comparison: Mild hyperostosis of the proximal tibia anteriorly,
left knee dated 01/11/2013 and left ankle obtained today.

CLINICAL DATA: Left lower leg pain.

EXAM:
LEFT TIBIA AND FIBULA - 2 VIEW

[view not recorded (1 of 2)]
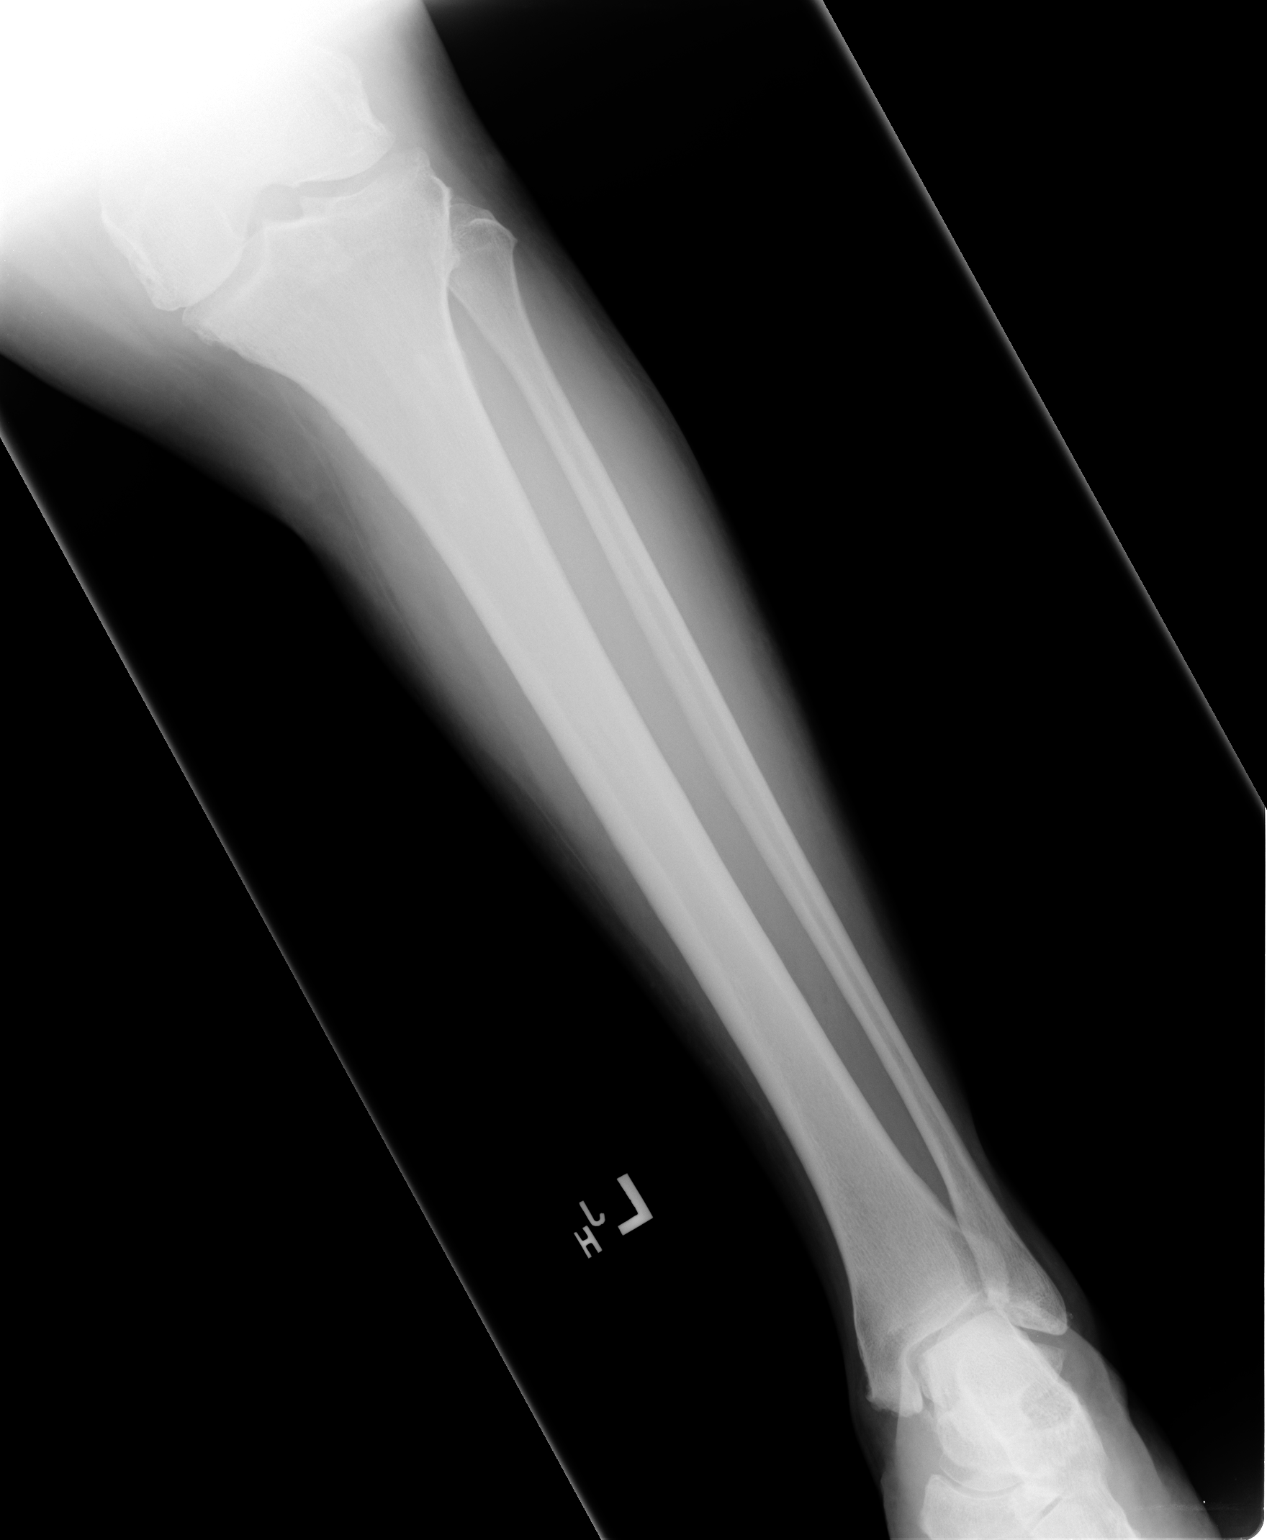

[view not recorded (2 of 2)]
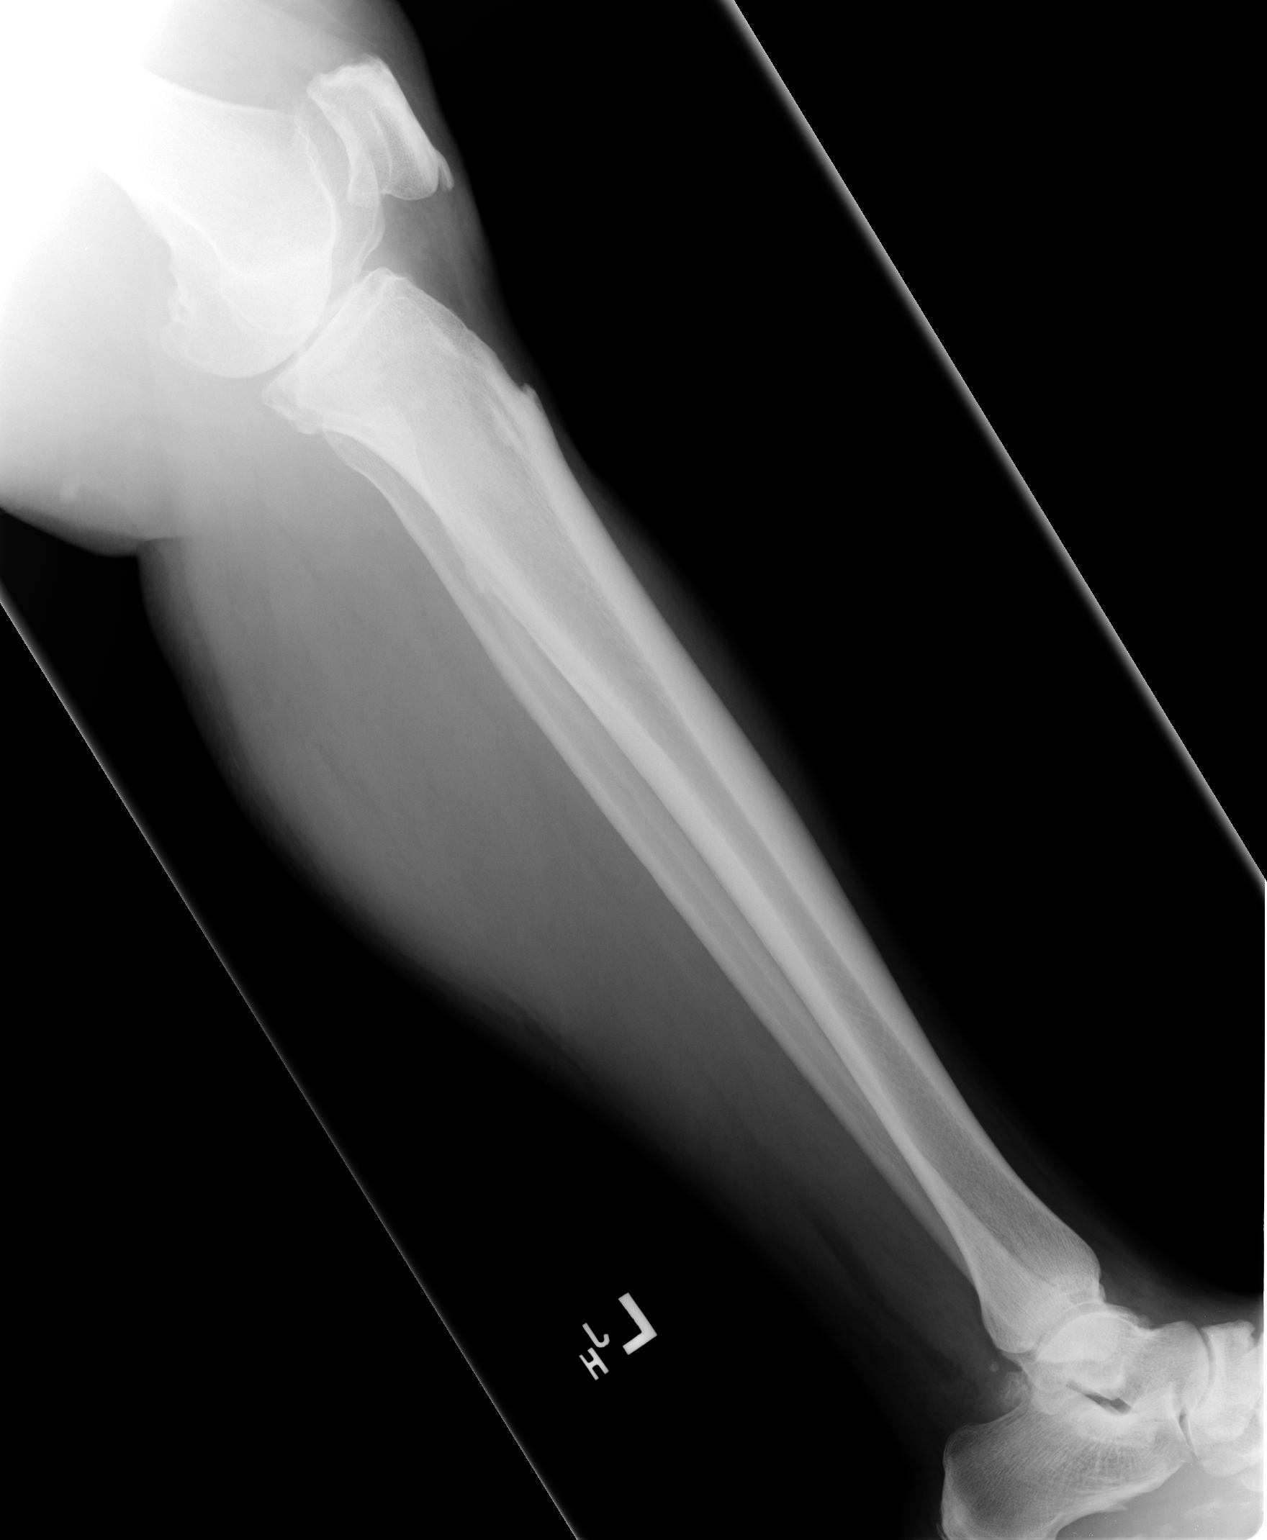

[2 of 2 positions shown; findings below may reference images not displayed]

FINDINGS: Previously noted degenerative changes of the left knee. There is
anterior patellar hyperostosis inferiorly as well as proximal tibial
hyperostosis involving the anterior, posterior, medial and lateral
portions of the metaphysis and metadiaphyseal region. Previously
noted hyperostosis involving the left ankle.
IMPRESSION: Degenerative changes of the knee as well as changes suggesting DISH
involving the proximal tibia, ankle and anterior patella.

## 2015-01-17 IMAGING — CR DG FOOT COMPLETE 3+V*L*
3 series · 3 of 3 positions shown · non-contrast
Comparison: None.

CLINICAL DATA: Left foot pain.

EXAM:
LEFT FOOT - COMPLETE 3+ VIEW

[view not recorded (1 of 3)]
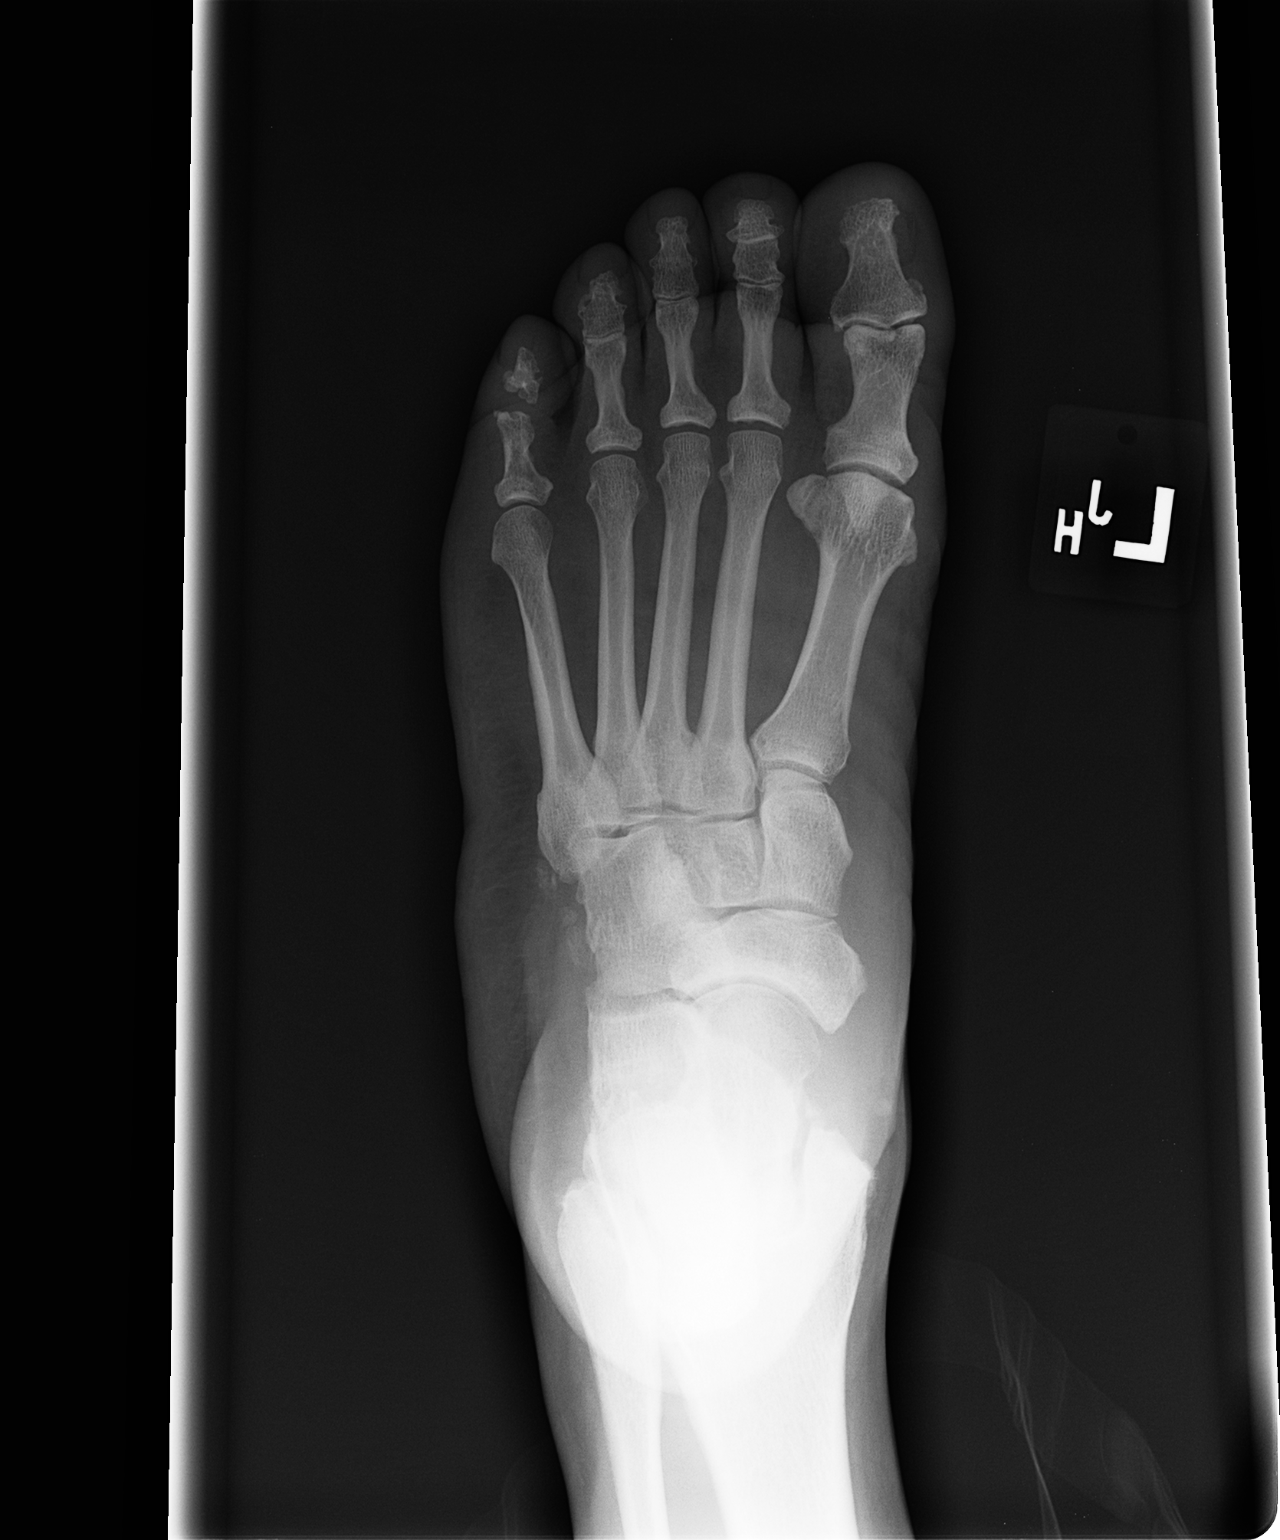

[view not recorded (2 of 3)]
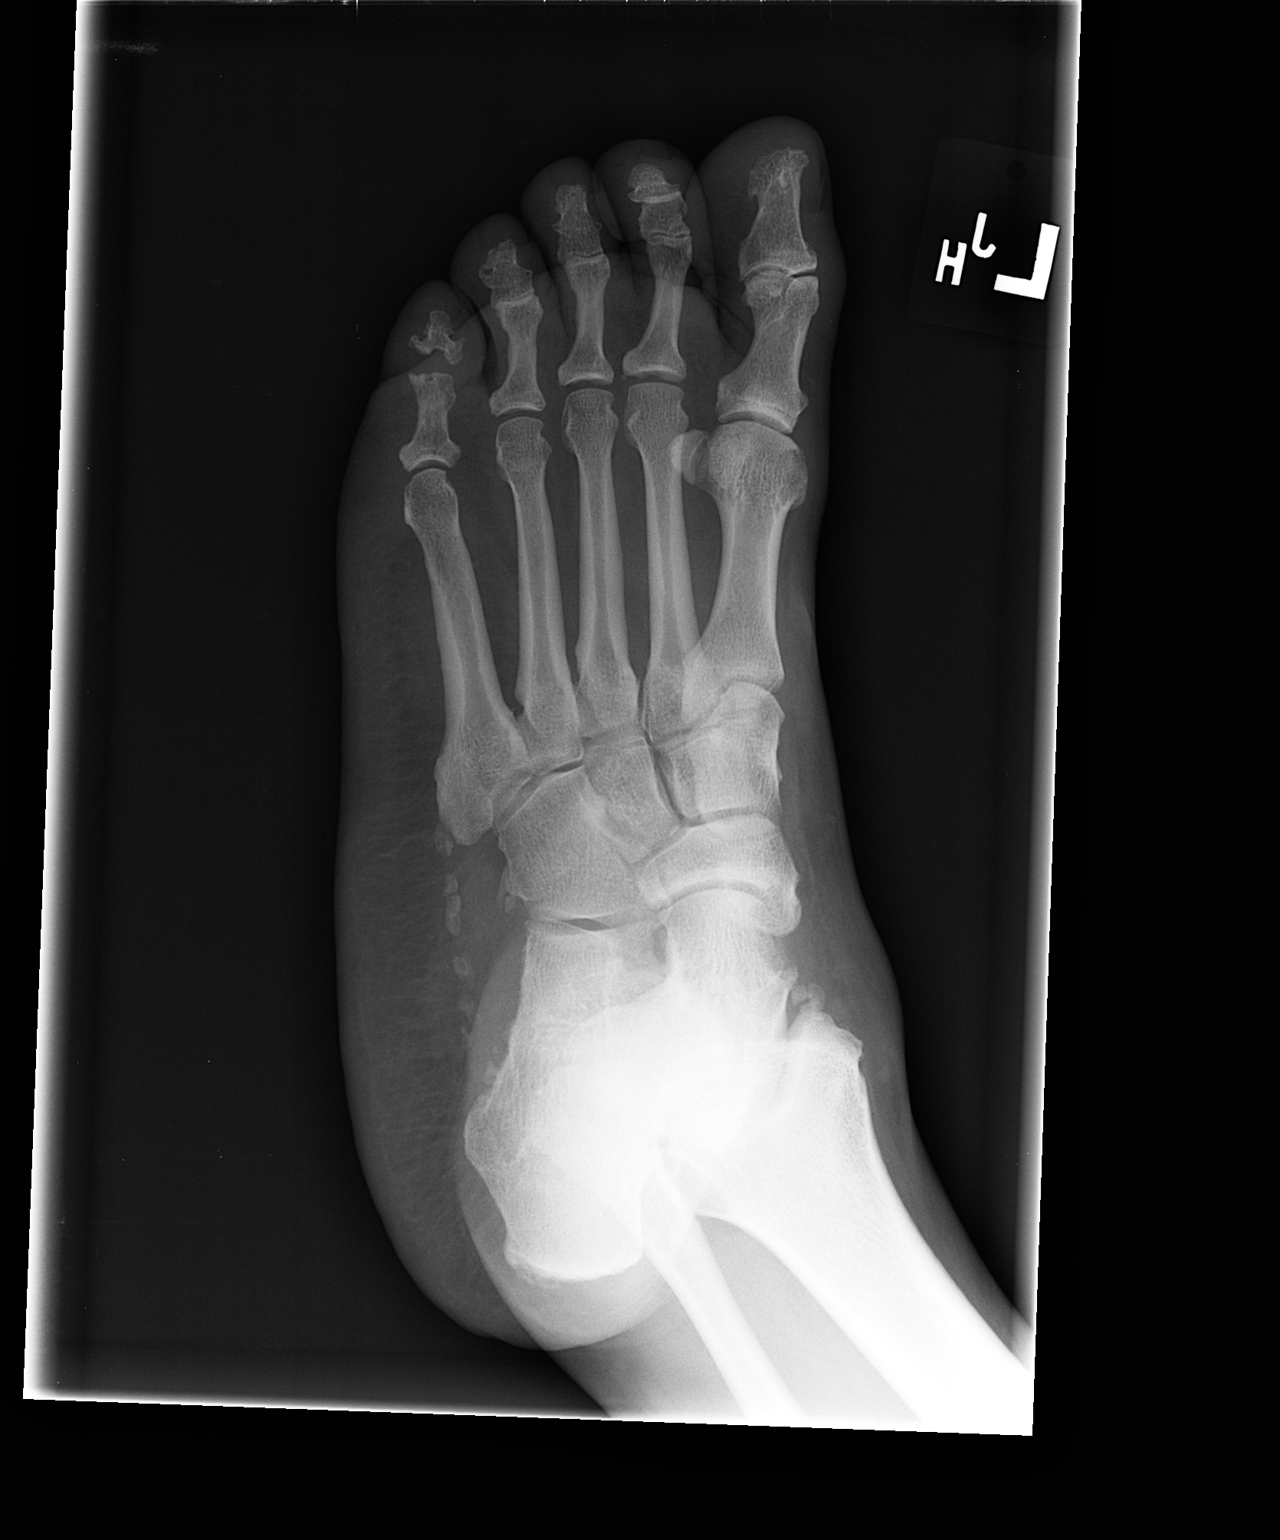

[view not recorded (3 of 3)]
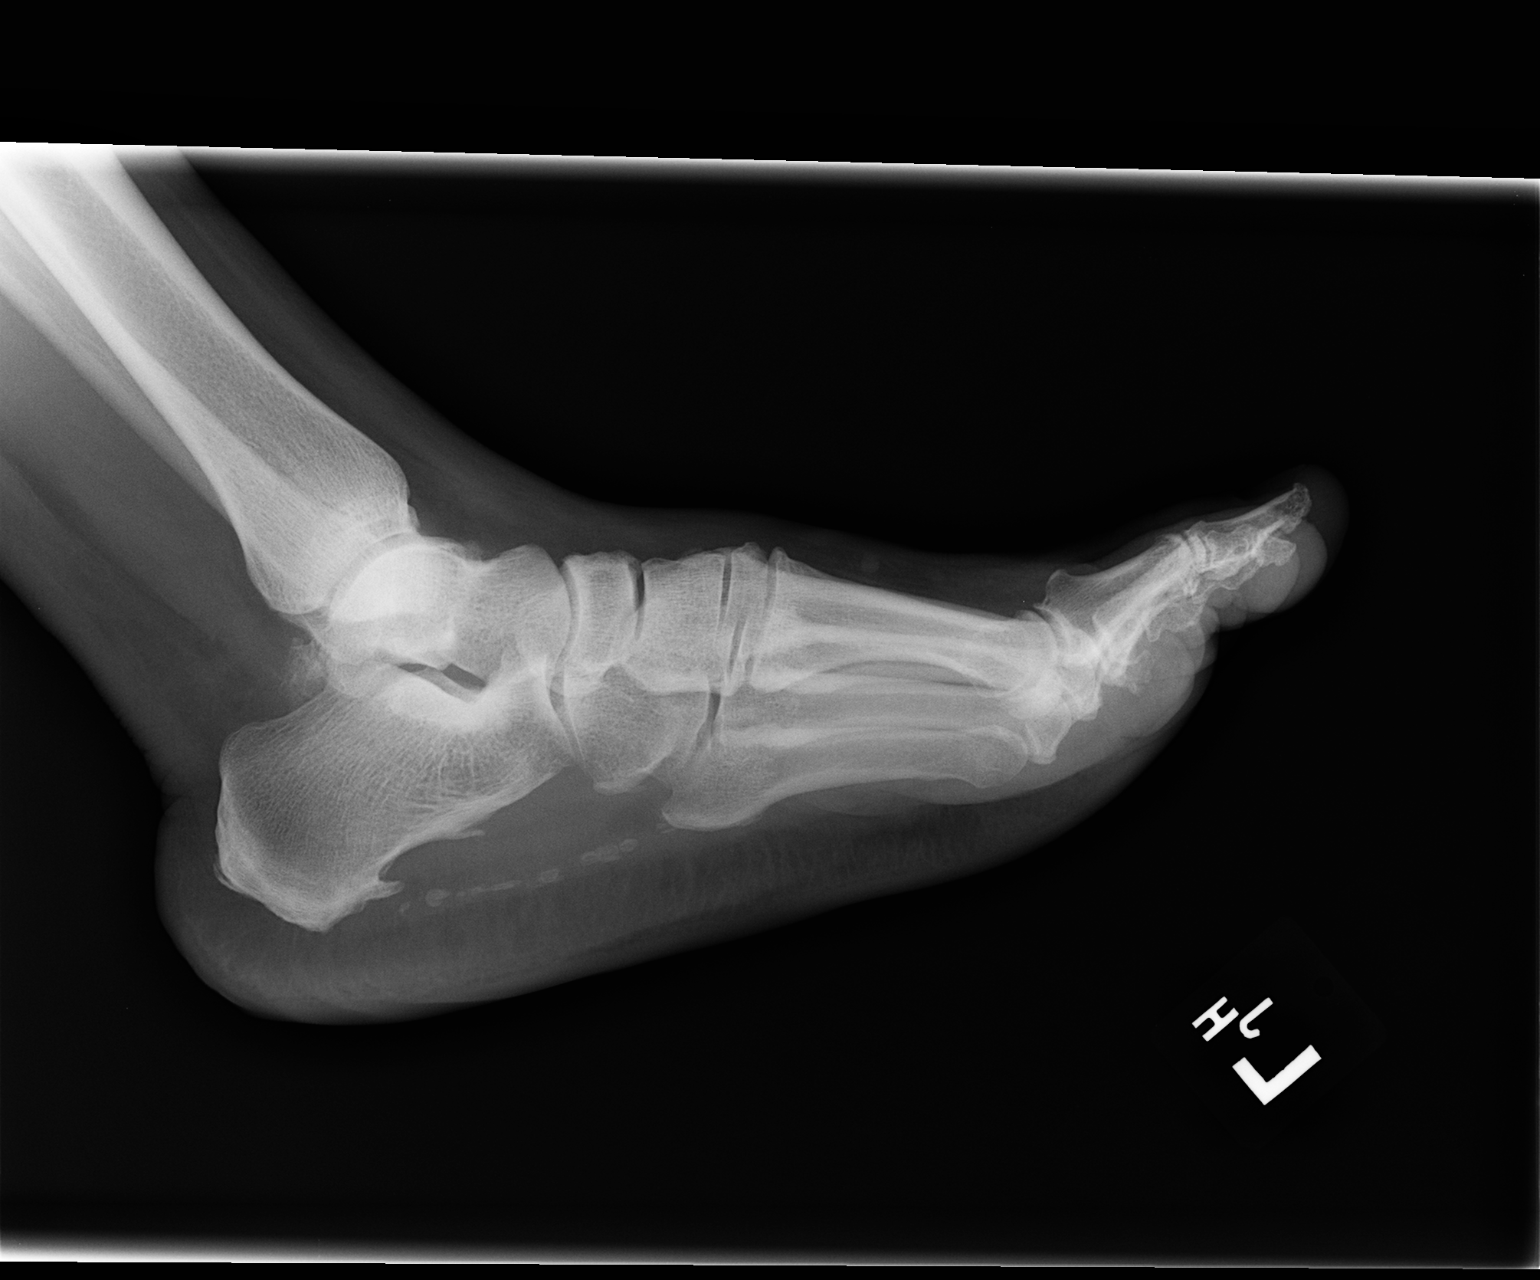

[3 of 3 positions shown; findings below may reference images not displayed]

FINDINGS: No fracture or dislocation is noted. Mild spurring of posterior
calcaneus is noted. Rounded calcifications are seen in the plantar
soft tissues which may represent some form of chronic fasciitis.
Joint spaces appear to be intact. The middle phalanx of the 5th toe
is not present most consistent with congenital absence.
IMPRESSION: No acute abnormality seen in the left foot.

## 2015-02-21 IMAGING — CR DG CHEST 2V
2 series · 2 of 2 positions shown · non-contrast
Comparison: 10/28/2012

CLINICAL DATA: Intermittent chest pain and difficulty breathing.

EXAM:
CHEST  2 VIEW

[w chest pa]
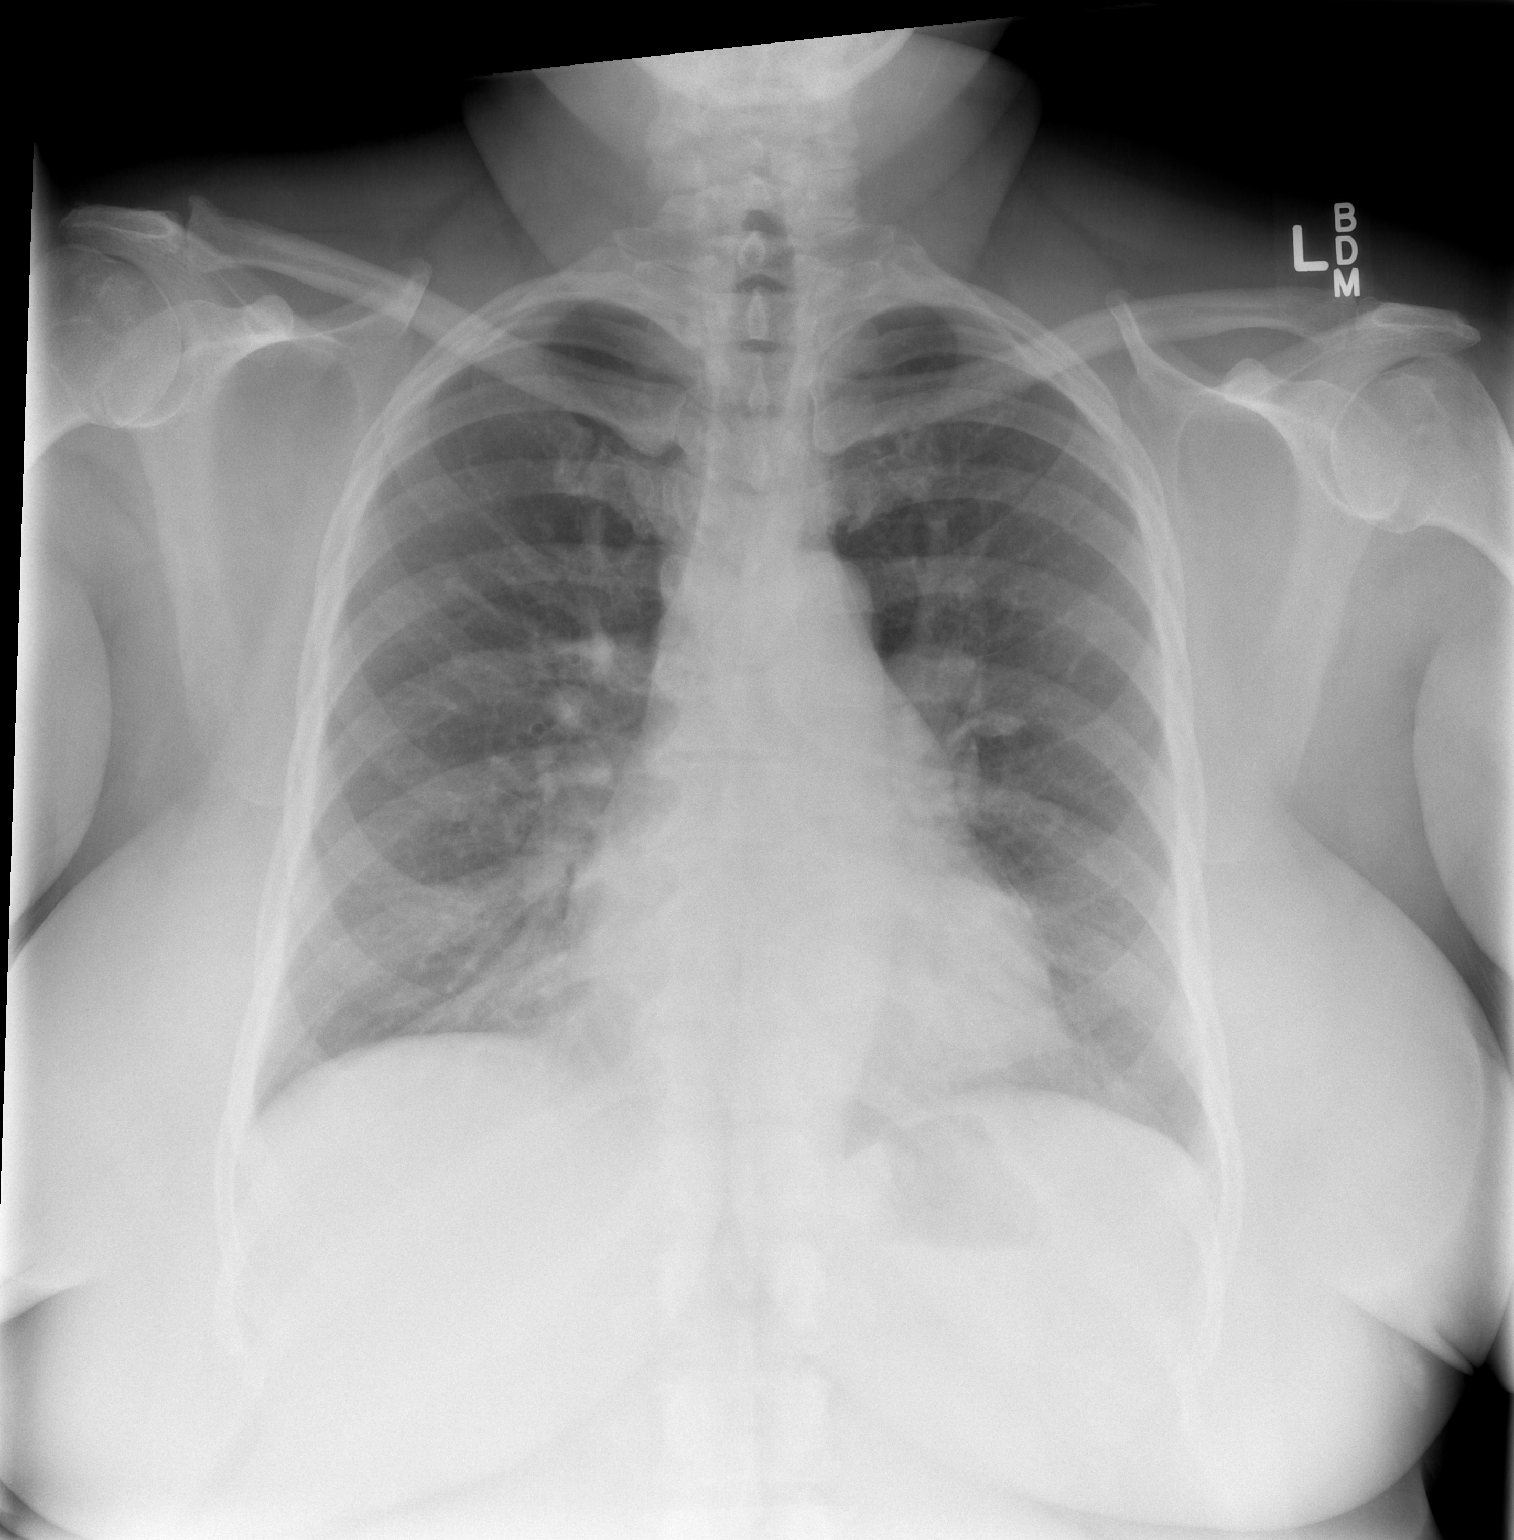

[w chest lat *]
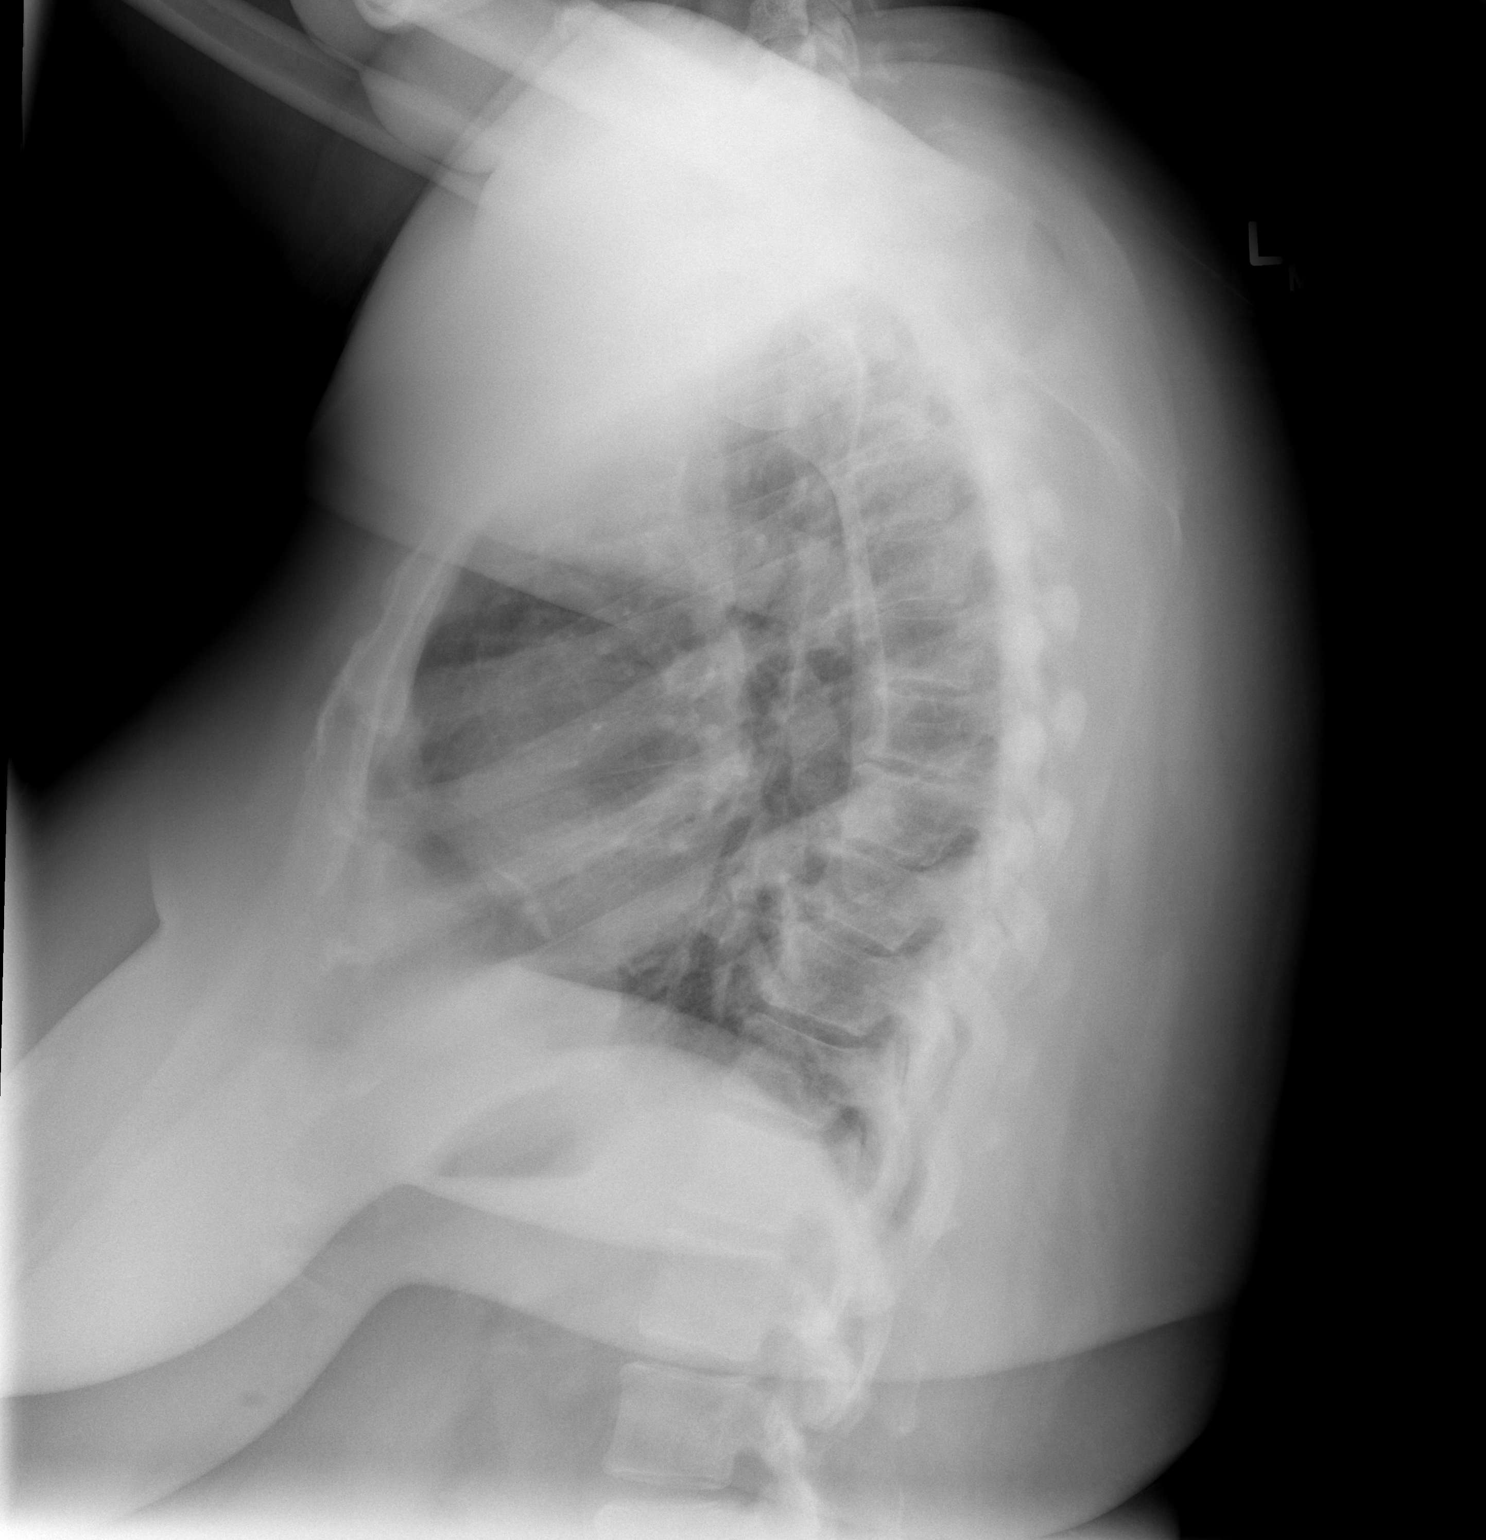

[2 of 2 positions shown; findings below may reference images not displayed]

FINDINGS: Cardiac silhouette is mildly enlarged. Mediastinum is normal in
contour. No hilar masses.

The lungs are clear.  No pleural effusion or pneumothorax.

The bony thorax is intact.
IMPRESSION: No acute cardiopulmonary disease.

## 2015-02-26 ENCOUNTER — Encounter (HOSPITAL_COMMUNITY): Payer: Self-pay | Admitting: Vascular Surgery

## 2015-02-26 ENCOUNTER — Emergency Department (HOSPITAL_COMMUNITY)
Admission: EM | Admit: 2015-02-26 | Discharge: 2015-02-27 | Disposition: A | Discharge status: Home / Self Care | Payer: Medicare Other | Attending: Physician Assistant | Admitting: Physician Assistant

## 2015-02-26 ENCOUNTER — Emergency Department (HOSPITAL_COMMUNITY): Payer: Medicare Other

## 2015-02-26 DIAGNOSIS — I209 Angina pectoris, unspecified: Secondary | ICD-10-CM | POA: Diagnosis not present

## 2015-02-26 DIAGNOSIS — F329 Major depressive disorder, single episode, unspecified: Secondary | ICD-10-CM | POA: Diagnosis not present

## 2015-02-26 DIAGNOSIS — E669 Obesity, unspecified: Secondary | ICD-10-CM | POA: Insufficient documentation

## 2015-02-26 DIAGNOSIS — J45909 Unspecified asthma, uncomplicated: Secondary | ICD-10-CM | POA: Diagnosis not present

## 2015-02-26 DIAGNOSIS — Z87891 Personal history of nicotine dependence: Secondary | ICD-10-CM | POA: Insufficient documentation

## 2015-02-26 DIAGNOSIS — R200 Anesthesia of skin: Secondary | ICD-10-CM | POA: Insufficient documentation

## 2015-02-26 DIAGNOSIS — Z86711 Personal history of pulmonary embolism: Secondary | ICD-10-CM | POA: Diagnosis not present

## 2015-02-26 DIAGNOSIS — Z8669 Personal history of other diseases of the nervous system and sense organs: Secondary | ICD-10-CM | POA: Diagnosis not present

## 2015-02-26 DIAGNOSIS — I509 Heart failure, unspecified: Secondary | ICD-10-CM | POA: Insufficient documentation

## 2015-02-26 DIAGNOSIS — Z85830 Personal history of malignant neoplasm of bone: Secondary | ICD-10-CM | POA: Insufficient documentation

## 2015-02-26 DIAGNOSIS — Z9889 Other specified postprocedural states: Secondary | ICD-10-CM | POA: Diagnosis not present

## 2015-02-26 DIAGNOSIS — F419 Anxiety disorder, unspecified: Secondary | ICD-10-CM | POA: Insufficient documentation

## 2015-02-26 DIAGNOSIS — R531 Weakness: Secondary | ICD-10-CM | POA: Diagnosis not present

## 2015-02-26 DIAGNOSIS — E119 Type 2 diabetes mellitus without complications: Secondary | ICD-10-CM | POA: Insufficient documentation

## 2015-02-26 DIAGNOSIS — R202 Paresthesia of skin: Secondary | ICD-10-CM | POA: Diagnosis not present

## 2015-02-26 DIAGNOSIS — Z880 Allergy status to penicillin: Secondary | ICD-10-CM | POA: Insufficient documentation

## 2015-02-26 DIAGNOSIS — R51 Headache: Secondary | ICD-10-CM | POA: Diagnosis present

## 2015-02-26 DIAGNOSIS — M6289 Other specified disorders of muscle: Secondary | ICD-10-CM | POA: Diagnosis not present

## 2015-02-26 DIAGNOSIS — Z7982 Long term (current) use of aspirin: Secondary | ICD-10-CM | POA: Diagnosis not present

## 2015-02-26 DIAGNOSIS — I10 Essential (primary) hypertension: Secondary | ICD-10-CM | POA: Insufficient documentation

## 2015-02-26 DIAGNOSIS — Z79899 Other long term (current) drug therapy: Secondary | ICD-10-CM | POA: Diagnosis not present

## 2015-02-26 LAB — DIFFERENTIAL
Basophils Absolute: 0 10*3/uL (ref 0.0–0.1)
Basophils Relative: 0 %
Eosinophils Absolute: 0.2 10*3/uL (ref 0.0–0.7)
Eosinophils Relative: 3 %
Lymphocytes Relative: 50 %
Lymphs Abs: 2.7 10*3/uL (ref 0.7–4.0)
Monocytes Absolute: 0.4 10*3/uL (ref 0.1–1.0)
Monocytes Relative: 8 %
Neutro Abs: 2.1 10*3/uL (ref 1.7–7.7)
Neutrophils Relative %: 39 %

## 2015-02-26 LAB — CBC
HCT: 39.4 % (ref 36.0–46.0)
Hemoglobin: 12.7 g/dL (ref 12.0–15.0)
MCH: 28.1 pg (ref 26.0–34.0)
MCHC: 32.2 g/dL (ref 30.0–36.0)
MCV: 87.2 fL (ref 78.0–100.0)
Platelets: 246 10*3/uL (ref 150–400)
RBC: 4.52 MIL/uL (ref 3.87–5.11)
RDW: 13.9 % (ref 11.5–15.5)
WBC: 5.3 10*3/uL (ref 4.0–10.5)

## 2015-02-26 LAB — PROTIME-INR
INR: 1.03 (ref 0.00–1.49)
Prothrombin Time: 13.7 seconds (ref 11.6–15.2)

## 2015-02-26 LAB — COMPREHENSIVE METABOLIC PANEL
ALT: 28 U/L (ref 14–54)
AST: 22 U/L (ref 15–41)
Albumin: 3.6 g/dL (ref 3.5–5.0)
Alkaline Phosphatase: 59 U/L (ref 38–126)
Anion gap: 11 (ref 5–15)
BUN: 12 mg/dL (ref 6–20)
CO2: 29 mmol/L (ref 22–32)
Calcium: 9.8 mg/dL (ref 8.9–10.3)
Chloride: 105 mmol/L (ref 101–111)
Creatinine, Ser: 0.91 mg/dL (ref 0.44–1.00)
GFR calc Af Amer: >60 mL/min (ref >60)
GFR calc non Af Amer: >60 mL/min (ref >60)
Glucose, Bld: 121 mg/dL — ABNORMAL HIGH (ref 65–99)
Potassium: 3.5 mmol/L (ref 3.5–5.1)
Sodium: 145 mmol/L (ref 135–145)
Total Bilirubin: 0.4 mg/dL (ref 0.3–1.2)
Total Protein: 6.7 g/dL (ref 6.5–8.1)

## 2015-02-26 LAB — APTT: aPTT: 26 seconds (ref 24–37)

## 2015-02-26 LAB — I-STAT TROPONIN, ED: Troponin i, poc: 0.01 ng/mL (ref 0.00–0.08)

## 2015-02-26 NOTE — ED Notes (Signed)
Pt reports to the ED for eval of jaw pain, posterior headache, and right sided weakness x several days. Pt also reports loss of balance since x 2-3 days as well. She has some neck stiffness as well. Also reports bilateral shoulder pain and some chest pressure that began today. Reports some associated diaphoresis, nausea, and lightheadedness. Grips equal. Some right arm drift present. Smile symmetric. Pt A&Ox4, resp e/u, and skin warm and dry.

## 2015-02-26 NOTE — ED Provider Notes (Addendum)
CSN: [id number]     Arrival date & time 02/26/15  2018 History   First MD Initiated Contact with Patient 02/26/15 2235     Chief Complaint  Patient presents with  . Headache     (Consider location/radiation/quality/duration/timing/severity/associated sxs/prior Treatment) HPI   Patient's a 54 year old female with hypertension diabetes sleep apnea obesity CHF presenting today with 2-3 days of numbness and weakness on her right hand side. Patient states that her right hand feels weak. She's also got numbness in the right arm and right leg. 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 She's been evaluated for this previously and there is nothing to be done per physicians per patietn.  Patient's noted no visual changes. Patient has mild headache stated on chief complaint but does not complain of headache to this provider.  Patient's last known normal was over 48 hours ago.     Past Medical History  Diagnosis Date  . Hypertension   . Diabetes mellitus   . Asthma   . Anginal pain ([place])     a. NL cath in 2008;  b. Myoview 03/2011: dec uptake along mid anterior wall on stress imaging -> ? attenuation vs. ischemia, EF 65%;  c. Echo 04/2011: EF 55-60%, no RWMA, Gr 2 dd  . Depression   . Anxiety   . Pulmonary embolism ([place])     a. 2008 -> coumadin x 6 mos.  . Sleep apnea   . 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 Obesity   . CHF (congestive heart failure) ([place])   . CHF (congestive heart failure) ([place])   . Pulmonary edema   . Cancer ([place])   . Bone cancer [facility])    Past Surgical History  Procedure Laterality Date  . Abdominal hysterectomy  2005  . Leg surgery    . Tubal ligation  1989  . Left knee surgery  2008  . Laparoscopic appendectomy N/A 06/03/2012    Procedure: APPENDECTOMY LAPAROSCOPIC;  Surgeon: Stark Klein, MD;  Location: [place];  Service: General;  Laterality: N/A;  . Cardiac catheterization      Normal  . Cardiac  catheterization N/A 09/30/2014    Procedure: Left Heart Cath and Coronary Angiography;  Surgeon: Sherren Mocha, MD;  Location: [place] [facility];  Service: Cardiovascular;  Laterality: N/A;   Family History  Problem Relation Age of Onset  . Emphysema Mother   . Arthritis Mother   . Heart failure Mother     alive @ 34  . Asthma Brother   . Heart disease Paternal Grandfather   . Heart disease Father     died @ 37's.  . Stroke Father   . Colon cancer Maternal Grandfather   . Diabetes Sister    Social History  Substance Use Topics  . Smoking status: Former Smoker -- 0.50 packs/day for 15 years    Types: Cigarettes    Quit date: 04/24/1985  . Smokeless tobacco: Never Used  . Alcohol Use: No   OB History    No data available     Review of Systems  Constitutional: Negative for chills and activity change.  HENT: Negative for congestion.   Respiratory: Negative for shortness of breath.   Cardiovascular: Negative for chest pain.  Gastrointestinal: Negative for abdominal pain.  Genitourinary: Negative for difficulty urinating.  Musculoskeletal: Negative for arthralgias.  Neurological: Positive for weakness and numbness. Negative for dizziness.  Psychiatric/Behavioral: Negative for behavioral  problems.      Allergies  Hydrocodone; Penicillins; and Sulfa antibiotics  Home Medications   Prior to Admission medications   Medication Sig Start Date End Date Taking? Authorizing Provider  albuterol (PROVENTIL,VENTOLIN) 90 MCG/ACT inhaler Inhale 2 puffs into the lungs every 4 (four) hours as needed for shortness of breath.    Yes Historical Provider, MD  aspirin 81 MG tablet Take 81 mg by mouth daily.   Yes Historical Provider, MD  atorvastatin (LIPITOR) 40 MG tablet Take 40 mg by mouth at bedtime.    Yes Historical Provider, MD  carvedilol (COREG CR) 10 MG 24 hr capsule Take 10 mg by mouth daily.   Yes Historical Provider, MD  furosemide (LASIX) 40 MG tablet Take 40 mg by mouth  daily.   Yes Ripudeep Krystal Eaton, MD  gabapentin (NEURONTIN) 300 MG capsule Take 2 capsules (600 mg total) by mouth 3 (three) times daily. 05/27/14  Yes Reyne Dumas, MD  isosorbide dinitrate (ISORDIL) 20 MG tablet Take 20 mg by mouth 2 (two) times daily.    Yes Historical Provider, MD  KLOR-CON M10 10 MEQ tablet Take 10 mEq by mouth 2 (two) times daily.  09/23/13  Yes Historical Provider, MD  lisinopril (PRINIVIL,ZESTRIL) 10 MG tablet Take 1 tablet by mouth daily. 09/21/14  Yes Historical Provider, MD  loratadine (CLARITIN) 10 MG tablet Take 10 mg by mouth daily.   Yes Historical Provider, MD  Multiple Vitamins-Minerals (MULTIVITAMIN PO) Take 1 tablet by mouth daily.   Yes Historical Provider, MD  pantoprazole (PROTONIX) 40 MG tablet Take 1 tablet (40 mg total) by mouth daily. 05/27/14  Yes Reyne Dumas, MD  Saxagliptin-Metformin (KOMBIGLYZE XR) 2.08-998 MG TB24 Take 1 tablet by mouth 2 (two) times daily.    Yes Historical Provider, MD  sertraline (ZOLOFT) 100 MG tablet Take 100 mg by mouth daily.    Yes Historical Provider, MD  ACCU-CHEK FASTCLIX LANCETS [place] USE TWICE DAILY TO CHECK SUGAR 08/27/14   Historical Provider, MD  ACCU-CHEK SMARTVIEW test strip USE TO [place] 08/26/14   Historical Provider, MD  cephALEXin (KEFLEX) 500 MG capsule Take 1 capsule (500 mg total) by mouth 2 (two) times daily. Patient not taking: Reported on 02/26/2015 11/05/14   Gardiner Barefoot, DPM  nitroGLYCERIN (NITROSTAT) 0.4 MG SL tablet Place 0.4 mg under the tongue every 5 (five) minutes as needed. For chest pain.    Historical Provider, MD   BP 131/65 mmHg  Pulse 80  Temp(Src) 98.3 F (36.8 C)  Resp 21  SpO2 95% Physical Exam  Constitutional: She is oriented to person, place, and time. She appears well-developed and well-nourished.  Obese 54 year old African-American female  HENT:  Head: Normocephalic and atraumatic.  Eyes: Conjunctivae are normal. Right eye exhibits no discharge.  Neck: Neck supple.   Cardiovascular: Normal rate, regular rhythm and normal heart sounds.   No murmur heard. Pulmonary/Chest: Effort normal and breath sounds normal. She has no wheezes. She has no rales.  Abdominal: Soft. She exhibits no distension. There is no tenderness.  Musculoskeletal: Normal range of motion. She exhibits no edema.  Neurological: She is oriented to person, place, and time. No cranial nerve deficit.  Extracted movements intact. Pupils intact. Patient has right hand weakness. Right arm and leg are reportedly numb. She does have sensation to light touch.  Able to left arm and leg to resistance.   Negative pronator drift.  Skin: Skin is warm and dry. No rash noted. She is not diaphoretic.  Nursing note and vitals reviewed.   ED Course  Procedures (including critical care time) Labs Review Labs Reviewed  COMPREHENSIVE METABOLIC PANEL - Abnormal; Notable for the following:    Glucose, Bld 121 (*)    All other components within normal limits  PROTIME-INR  APTT  CBC  DIFFERENTIAL  I-STAT TROPOININ, ED    Imaging Review Ct Head Wo Contrast  02/26/2015  CLINICAL DATA:  Facial numbness on the right and extremity weakness. Symptoms for 3 days. EXAM: CT HEAD WITHOUT CONTRAST TECHNIQUE: Contiguous axial images were obtained from the base of the skull through the vertex without intravenous contrast. COMPARISON:  Head CT and brain MRI, 05/24/2014 FINDINGS: The ventricles are normal in size and configuration. There are no parenchymal masses or mass effect. There is no evidence of an infarct. There are no extra-axial masses or abnormal fluid collections. There is no intracranial hemorrhage. Sinuses and mastoid air cells are clear.  No skull lesion. IMPRESSION: Normal unenhanced CT scan of the brain. Electronically Signed   By: Lajean Manes M.D.   On: 02/26/2015 23:13   I have personally reviewed and evaluated these images and lab results as part of my medical decision-making.   EKG  Interpretation   Date/Time:  Friday February 26 2015 20:40:33 EDT Ventricular Rate:  75 PR Interval:  190 QRS Duration: 106 QT Interval:  394 QTC Calculation: 439 R Axis:   67 Text Interpretation:  Normal sinus rhythm Incomplete left bundle branch  block Borderline ECG no acute ischemia No significant change since last  tracing Confirmed by Gerald Leitz ([id number]) on 02/26/2015 11:19:36 PM      MDM   Final diagnoses:  None    Patient's a 54 year old female with hypertension diabetes sleep apnea obesity CHF presenting today with 2-3 days of numbness and weakness on her right hand side. Patient does have demonstrable right hand weakness compared to left. We will get CT scan right now. Will likely need to admit and have neurology workup including MRI, Dopplers. Will need admission for stroke.  In reviewing her notes, she has seen neurology for left-sided weakness and numbness and had normal EMG studies in the past.   Arieon Scalzo Julio Alm, MD 02/26/15 2320   12:26 AM Neurology saw patient. They don't believe that this represents stroke. They would like to get an MRI. If negative discharge home. Will sign out patient with instructions to discharge if MRI negative.  Franchon Ketterman Julio Alm, MD 02/27/15 [phone]

## 2015-02-27 ENCOUNTER — Emergency Department (HOSPITAL_COMMUNITY): Payer: Medicare Other

## 2015-02-27 DIAGNOSIS — R202 Paresthesia of skin: Secondary | ICD-10-CM | POA: Diagnosis not present

## 2015-02-27 DIAGNOSIS — R200 Anesthesia of skin: Secondary | ICD-10-CM | POA: Diagnosis not present

## 2015-02-27 DIAGNOSIS — M6289 Other specified disorders of muscle: Secondary | ICD-10-CM

## 2015-02-27 NOTE — ED Notes (Signed)
Pt. Left with all belongings. Discharge instructions were reviewed and all questions were answered.  

## 2015-02-27 NOTE — Discharge Instructions (Signed)
We do not believe you had a stroke today. Your MRI was negative. Neurology saw you and feels that you are safe to follow-up as an outpatient. Please return with any concerning symptoms.

## 2015-02-27 NOTE — Consult Note (Signed)
Stroke Consult Consulting Physician: Dr Thomasene Lot ED  Chief Complaint:  right sided weakness and numbness  HPI: [name] is an 54 y.o. female hx of HTN, DM presenting with 2-3 days of right sided weakness and numbness. Describes decreased sensation on right face, arm and leg. Also notes that her right hand feels weak. Symptom onset > 2 days ago, decided to come to ED as it was not improving. In review of old records, she was admitted to [facility] in [facility]/2014 for left sided symptoms with unremarkable workup at that time.   CT head imaging reviewed and overall unremarkable.   Date last known well: 02/25/2015 Time last known well: unclear  tPA Given: No: outside tPA window Modified Rankin: Rankin Score=0  Past Medical History  Diagnosis Date  . Hypertension   . Diabetes mellitus   . Asthma   . Anginal pain ([place])     a. NL cath in 2008;  b. Myoview 03/2011: dec uptake along mid anterior wall on stress imaging -> ? attenuation vs. ischemia, EF 65%;  c. Echo 04/2011: EF 55-60%, no RWMA, Gr 2 dd  . Depression   . Anxiety   . Pulmonary embolism ([place])     a. 2008 -> coumadin x 6 mos.  . Sleep apnea   . 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 Obesity   . CHF (congestive heart failure) ([place])   . CHF (congestive heart failure) ([place])   . Pulmonary edema   . Cancer ([place])   . Bone cancer [facility])     Past Surgical History  Procedure Laterality Date  . Abdominal hysterectomy  2005  . Leg surgery    . Tubal ligation  1989  . Left knee surgery  2008  . Laparoscopic appendectomy N/A 06/03/2012    Procedure: APPENDECTOMY LAPAROSCOPIC;  Surgeon: Stark Klein, MD;  Location: [place];  Service: General;  Laterality: N/A;  . Cardiac catheterization      Normal  . Cardiac catheterization N/A 09/30/2014    Procedure: Left Heart Cath and Coronary Angiography;  Surgeon: Sherren Mocha, MD;  Location: [place] [facility];  Service: Cardiovascular;  Laterality: N/A;    Family History   Problem Relation Age of Onset  . Emphysema Mother   . Arthritis Mother   . Heart failure Mother     alive @ 35  . Asthma Brother   . Heart disease Paternal Grandfather   . Heart disease Father     died @ 3's.  . Stroke Father   . Colon cancer Maternal Grandfather   . Diabetes Sister    Social History:  reports that she quit smoking about 29 years ago. Her smoking use included Cigarettes. She has a 7.5 pack-year smoking history. She has never used smokeless tobacco. She reports that she does not drink alcohol or use illicit drugs.  Allergies:  Allergies  Allergen Reactions  . Hydrocodone Other (See Comments)    Causes Hallucinations  . Penicillins Swelling  . Sulfa Antibiotics Diarrhea, Itching and Rash     (Not in a hospital admission)  ROS: Out of a complete 14 system review, the patient complains of only the following symptoms, and all other reviewed systems are negative. +paresthesias   Physical Examination: Filed Vitals:   02/26/15 2250  BP: 131/65  Pulse: 80  Temp:   Resp: 21   Physical Exam  Constitutional: He appears well-developed and well-nourished.  Psych: Affect appropriate to situation Eyes:  No scleral injection HENT: No OP obstrucion Head: Normocephalic.  Cardiovascular: Normal rate and regular rhythm.  Respiratory: Effort normal and breath sounds normal.  GI: Soft. Bowel sounds are normal. No distension. There is no tenderness.  Skin: WDI  Neurologic Examination: Mental Status: Alert, oriented, thought content appropriate.  Speech fluent without evidence of aphasia. No dysarthria.  Able to follow 3 step commands without difficulty. Cranial Nerves: II: funduscopic exam wnl bilaterally, visual fields grossly normal, pupils equal, round, reactive to light and accommodation III,IV, VI: ptosis not present, extra-ocular motions intact bilaterally V,VII: smile symmetric, subjective decreased LT on the right side VIII: hearing normal  bilaterally IX,X: gag reflex present XI: trapezius strength/neck flexion strength normal bilaterally XII: tongue strength normal  Motor: Right : Upper extremity    Left:     Upper extremity 5/5 deltoid       5/5 deltoid 5/5 biceps      5/5 biceps  5/5 triceps      5/5 triceps 5-/5 hand grip (question full effort)   5/5 hand grip  Lower extremity     Lower extremity 5/5 hip flexor      5/5 hip flexor 5/5 quadricep      5/5 quadriceps  5/5 hamstrings     5/5 hamstrings 5/5 plantar flexion       5/5 plantar flexion 5/5 plantar extension     5/5 plantar extension Tone and bulk:normal tone throughout; no atrophy noted Sensory: decreased LT and PP on right side Deep Tendon Reflexes: 2+ and symmetric throughout Plantars: Right: downgoing   Left: downgoing Cerebellar: normal finger-to-nose, and normal heel-to-shin test Gait: deferred  Laboratory Studies:   Basic Metabolic Panel:  Recent Labs Lab 02/26/15 2103  NA 145  K 3.5  CL 105  CO2 29  GLUCOSE 121*  BUN 12  CREATININE 0.91  CALCIUM 9.8    Liver Function Tests:  Recent Labs Lab 02/26/15 2103  AST 22  ALT 28  ALKPHOS 59  BILITOT 0.4  PROT 6.7  ALBUMIN 3.6   No results for input(s): LIPASE, AMYLASE in the last 168 hours. No results for input(s): AMMONIA in the last 168 hours.  CBC:  Recent Labs Lab 02/26/15 2103  WBC 5.3  NEUTROABS 2.1  HGB 12.7  HCT 39.4  MCV 87.2  PLT 246    Cardiac Enzymes: No results for input(s): CKTOTAL, CKMB, CKMBINDEX, TROPONINI in the last 168 hours.  BNP: Invalid input(s): POCBNP  CBG: No results for input(s): GLUCAP in the last 168 hours.  Microbiology: Results for orders placed or performed during the hospital encounter of 09/27/14  MRSA PCR Screening     Status: None   Collection Time: 09/28/14  4:02 AM  Result Value Ref Range Status   MRSA by PCR NEGATIVE NEGATIVE Final    Comment:        The GeneXpert MRSA Assay (FDA approved for NASAL specimens only), is  one component of a comprehensive MRSA colonization surveillance program. It is not intended to diagnose MRSA infection nor to guide or monitor treatment for MRSA infections.     Coagulation Studies:  Recent Labs  02/26/15 2103  LABPROT 13.7  INR 1.03    Urinalysis: No results for input(s): COLORURINE, LABSPEC, PHURINE, GLUCOSEU, HGBUR, BILIRUBINUR, KETONESUR, PROTEINUR, UROBILINOGEN, NITRITE, LEUKOCYTESUR in the last 168 hours.  Invalid input(s): APPERANCEUR  Lipid Panel:     Component Value Date/Time   CHOL 173 04/23/2011 0830   CHOL 171 04/23/2011 0830   TRIG 147  04/23/2011 0830   TRIG 143 04/23/2011 0830   HDL 41 04/23/2011 0830   HDL 40 04/23/2011 0830   CHOLHDL 4.2 04/23/2011 0830   CHOLHDL 4.3 04/23/2011 0830   VLDL 29 04/23/2011 0830   VLDL 29 04/23/2011 0830   LDLCALC 103* 04/23/2011 0830   LDLCALC 102* 04/23/2011 0830    HgbA1C:  Lab Results  Component Value Date   HGBA1C 6.2* 10/28/2012    Urine Drug Screen:  No results found for: LABOPIA, COCAINSCRNUR, LABBENZ, AMPHETMU, THCU, LABBARB  Alcohol Level: No results for input(s): ETH in the last 168 hours.  Other results:  Imaging: Ct Head Wo Contrast  02/26/2015  CLINICAL DATA:  Facial numbness on the right and extremity weakness. Symptoms for 3 days. EXAM: CT HEAD WITHOUT CONTRAST TECHNIQUE: Contiguous axial images were obtained from the base of the skull through the vertex without intravenous contrast. COMPARISON:  Head CT and brain MRI, 05/24/2014 FINDINGS: The ventricles are normal in size and configuration. There are no parenchymal masses or mass effect. There is no evidence of an infarct. There are no extra-axial masses or abnormal fluid collections. There is no intracranial hemorrhage. Sinuses and mastoid air cells are clear.  No skull lesion. IMPRESSION: Normal unenhanced CT scan of the brain. Electronically Signed   By: Lajean Manes M.D.   On: 02/26/2015 23:13    Assessment: 54 y.o. female hx  of HTN, DM presenting with 2-3 day history of right sided sensory deficits and hand weakness. Exam pertinent for subjective right sided sensory deficits and minimal right hand weakness. With risk factors cannot rule out CVA.   -MRI brain. If positive would complete stroke workup -increase ASA to 325mg  daily -if MRI negative for stroke then no further inpatient neurological workup. Can follow up with outpatient neurologist.    Jim Like, DO [facility] [phone]  If 7pm- 7am, please page neurology on call as listed in [place]. 02/27/2015, 12:07 AM

## 2015-02-27 NOTE — ED Notes (Signed)
Patient transported to MRI 

## 2015-03-08 IMAGING — CR DG CHEST 2V
2 series · 2 of 2 positions shown · non-contrast
Comparison: 04/18/2013

CLINICAL DATA: Chest pain

EXAM:
CHEST  2 VIEW

[w chest pa]
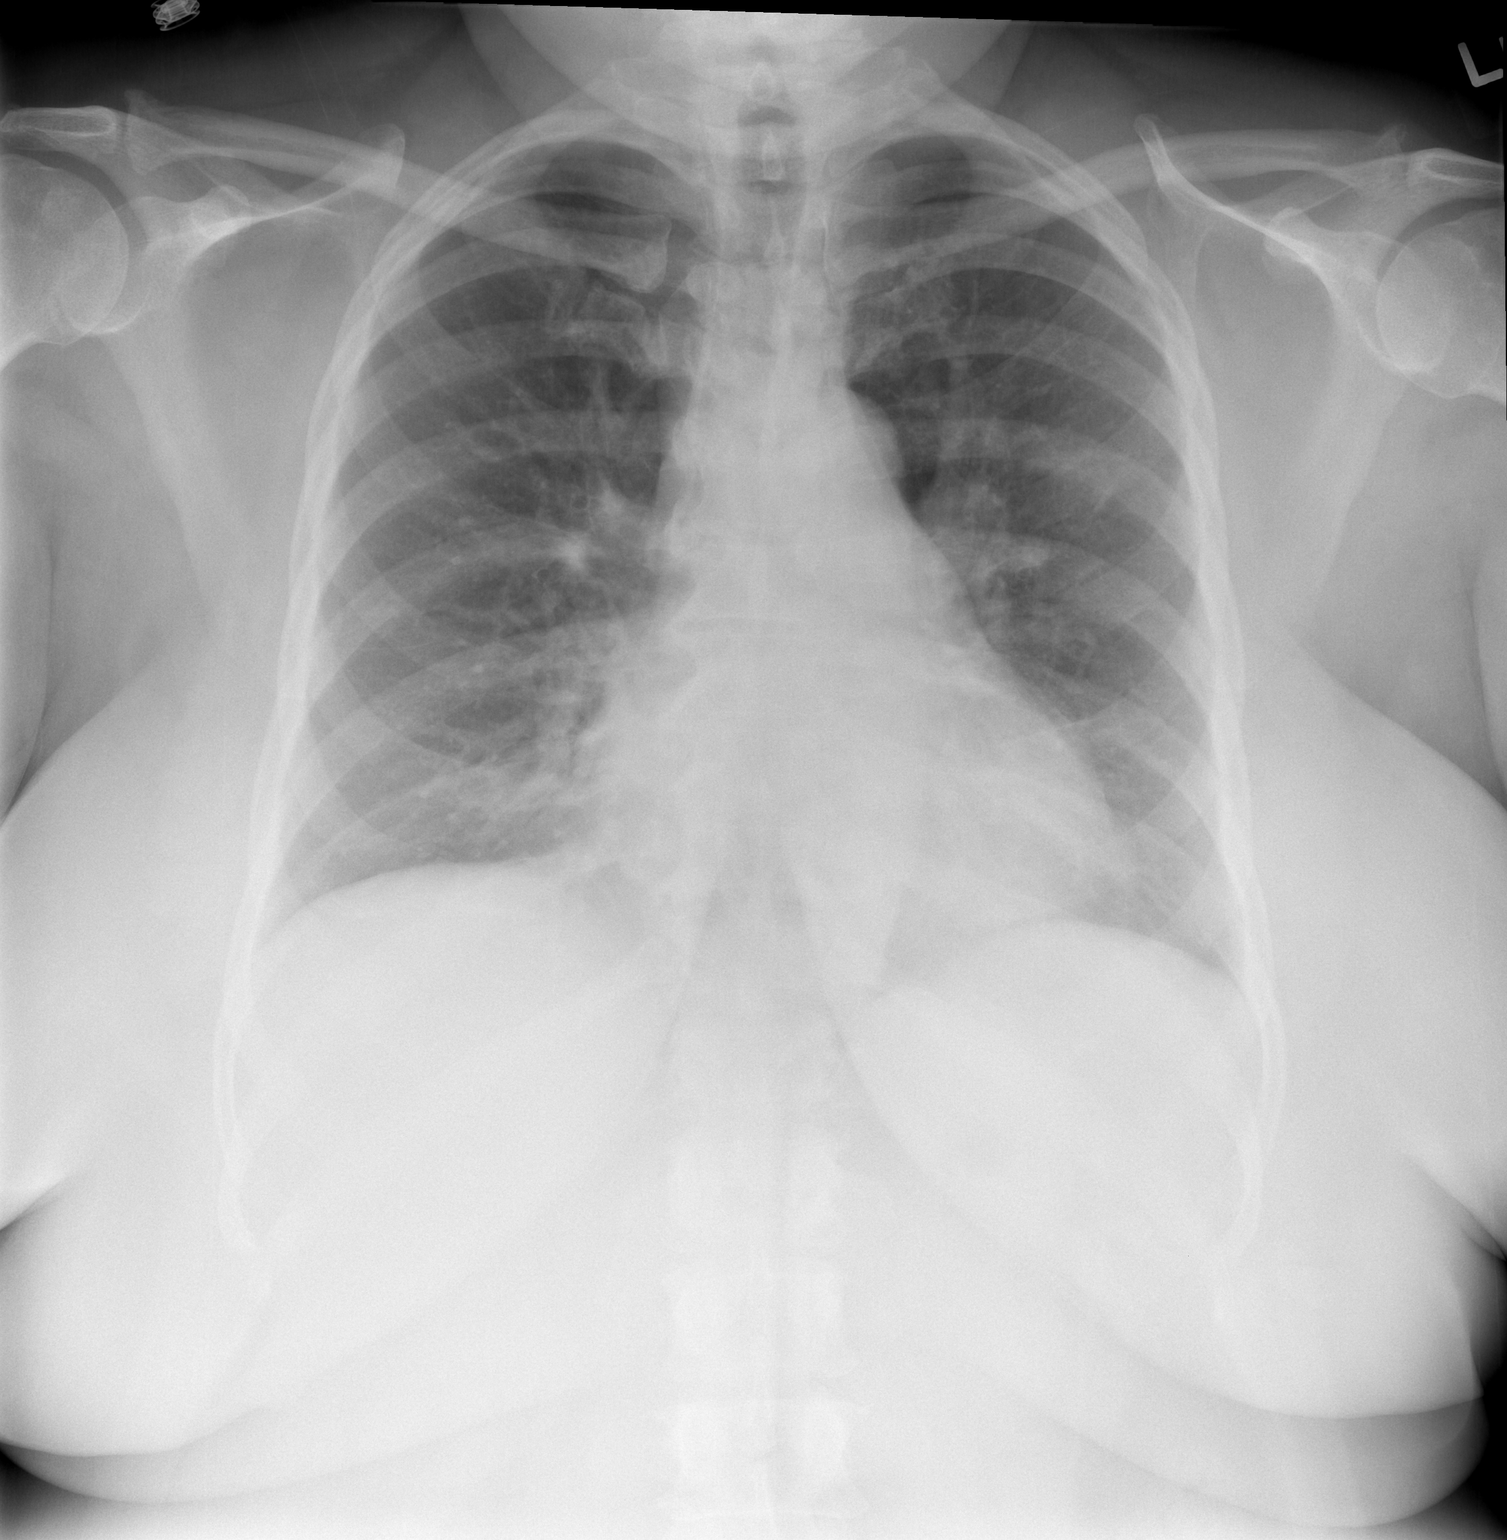

[w chest lat]
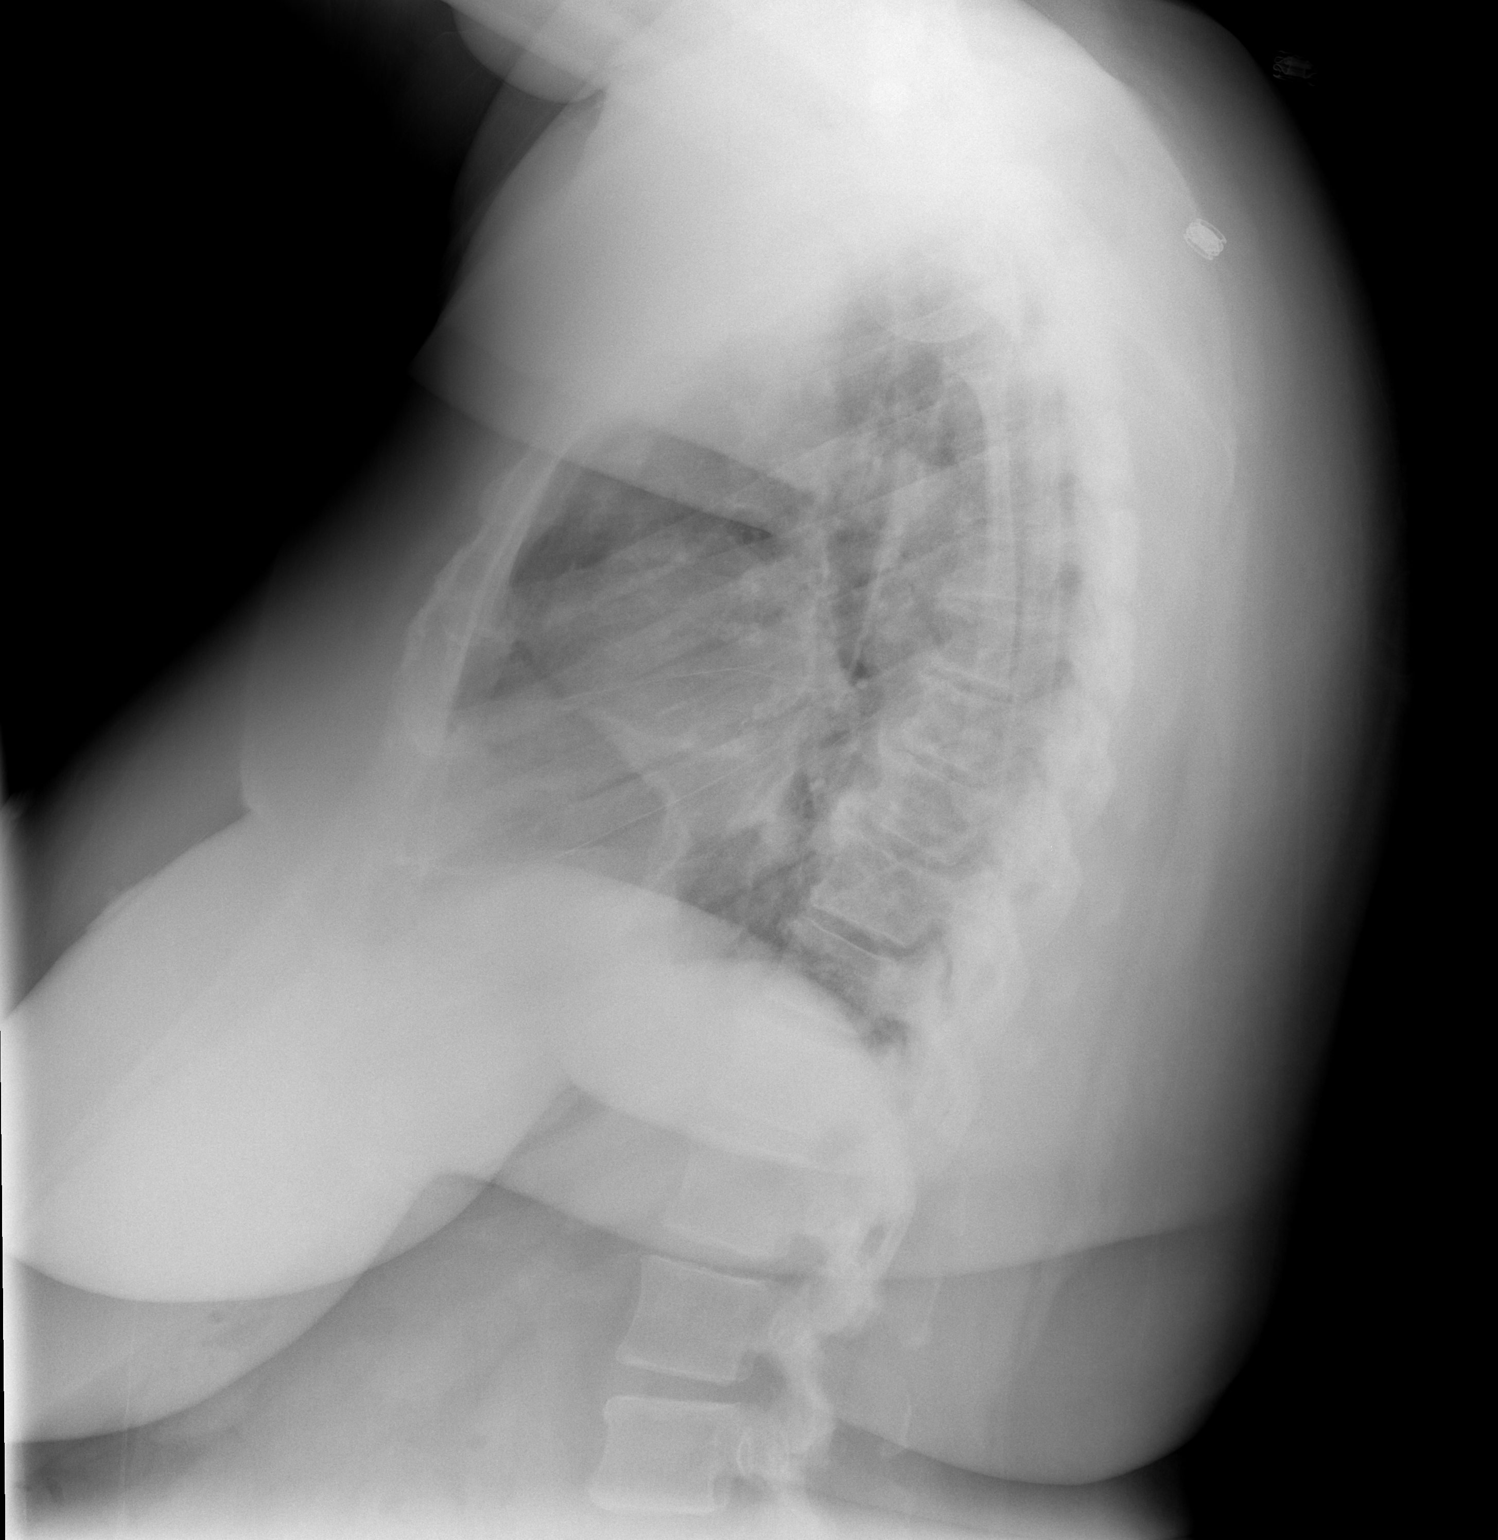

[2 of 2 positions shown; findings below may reference images not displayed]

FINDINGS: Cardiomediastinal silhouette is stable. No acute infiltrate or
pleural effusion. No pulmonary edema. Degenerative changes thoracic
spine again noted.
IMPRESSION: No active cardiopulmonary disease.

## 2015-04-05 ENCOUNTER — Emergency Department (HOSPITAL_COMMUNITY): Payer: Medicare Other

## 2015-04-05 ENCOUNTER — Emergency Department (HOSPITAL_COMMUNITY)
Admission: EM | Admit: 2015-04-05 | Discharge: 2015-04-05 | Disposition: A | Discharge status: Home / Self Care | Payer: Medicare Other | Attending: Emergency Medicine | Admitting: Emergency Medicine

## 2015-04-05 ENCOUNTER — Encounter (HOSPITAL_COMMUNITY): Payer: Self-pay | Admitting: Family Medicine

## 2015-04-05 DIAGNOSIS — E669 Obesity, unspecified: Secondary | ICD-10-CM | POA: Insufficient documentation

## 2015-04-05 DIAGNOSIS — M791 Myalgia, unspecified site: Secondary | ICD-10-CM

## 2015-04-05 DIAGNOSIS — Z8669 Personal history of other diseases of the nervous system and sense organs: Secondary | ICD-10-CM | POA: Insufficient documentation

## 2015-04-05 DIAGNOSIS — I10 Essential (primary) hypertension: Secondary | ICD-10-CM | POA: Diagnosis not present

## 2015-04-05 DIAGNOSIS — J45901 Unspecified asthma with (acute) exacerbation: Secondary | ICD-10-CM | POA: Insufficient documentation

## 2015-04-05 DIAGNOSIS — Z7982 Long term (current) use of aspirin: Secondary | ICD-10-CM | POA: Diagnosis not present

## 2015-04-05 DIAGNOSIS — Z880 Allergy status to penicillin: Secondary | ICD-10-CM | POA: Insufficient documentation

## 2015-04-05 DIAGNOSIS — Z79899 Other long term (current) drug therapy: Secondary | ICD-10-CM | POA: Insufficient documentation

## 2015-04-05 DIAGNOSIS — Z86711 Personal history of pulmonary embolism: Secondary | ICD-10-CM | POA: Insufficient documentation

## 2015-04-05 DIAGNOSIS — F329 Major depressive disorder, single episode, unspecified: Secondary | ICD-10-CM | POA: Diagnosis not present

## 2015-04-05 DIAGNOSIS — Z87891 Personal history of nicotine dependence: Secondary | ICD-10-CM | POA: Diagnosis not present

## 2015-04-05 DIAGNOSIS — Z85830 Personal history of malignant neoplasm of bone: Secondary | ICD-10-CM | POA: Diagnosis not present

## 2015-04-05 DIAGNOSIS — E119 Type 2 diabetes mellitus without complications: Secondary | ICD-10-CM | POA: Diagnosis not present

## 2015-04-05 DIAGNOSIS — R0602 Shortness of breath: Secondary | ICD-10-CM | POA: Diagnosis present

## 2015-04-05 DIAGNOSIS — F419 Anxiety disorder, unspecified: Secondary | ICD-10-CM | POA: Diagnosis not present

## 2015-04-05 DIAGNOSIS — I509 Heart failure, unspecified: Secondary | ICD-10-CM | POA: Insufficient documentation

## 2015-04-05 DIAGNOSIS — J209 Acute bronchitis, unspecified: Secondary | ICD-10-CM

## 2015-04-05 LAB — CBC
HCT: 40.9 % (ref 36.0–46.0)
Hemoglobin: 13.1 g/dL (ref 12.0–15.0)
MCH: 28.2 pg (ref 26.0–34.0)
MCHC: 32 g/dL (ref 30.0–36.0)
MCV: 88 fL (ref 78.0–100.0)
Platelets: 209 10*3/uL (ref 150–400)
RBC: 4.65 MIL/uL (ref 3.87–5.11)
RDW: 14 % (ref 11.5–15.5)
WBC: 3.6 10*3/uL — ABNORMAL LOW (ref 4.0–10.5)

## 2015-04-05 LAB — BASIC METABOLIC PANEL
Anion gap: 8 (ref 5–15)
BUN: 8 mg/dL (ref 6–20)
CO2: 26 mmol/L (ref 22–32)
Calcium: 9.3 mg/dL (ref 8.9–10.3)
Chloride: 106 mmol/L (ref 101–111)
Creatinine, Ser: 0.86 mg/dL (ref 0.44–1.00)
GFR calc Af Amer: >60 mL/min (ref >60)
GFR calc non Af Amer: >60 mL/min (ref >60)
Glucose, Bld: 93 mg/dL (ref 65–99)
Potassium: 3.7 mmol/L (ref 3.5–5.1)
Sodium: 140 mmol/L (ref 135–145)

## 2015-04-05 LAB — I-STAT TROPONIN, ED: Troponin i, poc: 0 ng/mL (ref 0.00–0.08)

## 2015-04-05 LAB — BRAIN NATRIURETIC PEPTIDE: B Natriuretic Peptide: 53.7 pg/mL (ref 0.0–100.0)

## 2015-04-05 MED ORDER — ACETAMINOPHEN 325 MG PO TABS
650.0000 mg | ORAL_TABLET | Freq: Once | ORAL | Status: AC
  Administered 2015-04-05: 650 mg via ORAL
  Filled 2015-04-05: qty 2

## 2015-04-05 MED ORDER — AEROCHAMBER PLUS FLO-VU LARGE MISC
1.0000 | Freq: Once | Status: DC
  Filled 2015-04-05: qty 1

## 2015-04-05 MED ORDER — ALBUTEROL SULFATE (2.5 MG/3ML) 0.083% IN NEBU
2.5000 mg | INHALATION_SOLUTION | Freq: Once | RESPIRATORY_TRACT | Status: AC
  Administered 2015-04-05: 2.5 mg via RESPIRATORY_TRACT
  Filled 2015-04-05: qty 3

## 2015-04-05 MED ORDER — AEROCHAMBER PLUS W/MASK MISC
1.0000 | Freq: Once | Status: AC
  Administered 2015-04-05: 1
  Filled 2015-04-05: qty 1

## 2015-04-05 MED ORDER — ALBUTEROL SULFATE HFA 108 (90 BASE) MCG/ACT IN AERS
2.0000 | INHALATION_SPRAY | RESPIRATORY_TRACT | Status: DC | PRN
  Administered 2015-04-05: 2 via RESPIRATORY_TRACT
  Filled 2015-04-05: qty 6.7

## 2015-04-05 NOTE — ED Notes (Signed)
MD at bedside. 

## 2015-04-05 NOTE — ED Provider Notes (Signed)
CSN: [id number]     Arrival date & time 04/05/15  1556 History   First MD Initiated Contact with Patient 04/05/15 2006     Chief Complaint  Patient presents with  . Shortness of Breath     (Consider location/radiation/quality/duration/timing/severity/associated sxs/prior Treatment) HPI Comments: This is a 54 year old African-American female reports that for the past 4 days.  She is coughing, mild nausea, occasional loose stool.  Past 3 days.  She's had chest discomfort is worse with palpation, movement or coughing.  She has not taken any medication for her symptoms.  She denies fever or chills. She does have significant history for CHF, hypertension, diabetes, asthma, angina, anxiety, depression, pulmonary emboli, last taken Coumadin in 2008.  Patient is a 54 y.o. female presenting with shortness of breath. The history is provided by the patient.  Shortness of Breath Severity:  Mild Onset quality:  Gradual Duration:  4 days Timing:  Intermittent Progression:  Unchanged Chronicity:  New Context comment:  Cough Relieved by:  Nothing Worsened by:  Activity Ineffective treatments:  None tried Associated symptoms: chest pain and cough   Associated symptoms: no fever, no headaches, no rash, no vomiting and no wheezing   Chest pain:    Quality:  Aching   Severity:  Mild   Onset quality:  Gradual   Timing:  Intermittent   Chronicity:  New Cough:    Cough characteristics:  Non-productive   Severity:  Mild   Onset quality:  Gradual   Duration:  3 days   Timing:  Intermittent   Progression:  Unchanged   Chronicity:  New   Past Medical History  Diagnosis Date  . Hypertension   . Diabetes mellitus   . Asthma   . Anginal pain ([place])     a. NL cath in 2008;  b. Myoview 03/2011: dec uptake along mid anterior wall on stress imaging -> ? attenuation vs. ischemia, EF 65%;  c. Echo 04/2011: EF 55-60%, no RWMA, Gr 2 dd  . Depression   . Anxiety   . Pulmonary embolism ([place])     a.  2008 -> coumadin x 6 mos.  . Sleep apnea   . 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 Obesity   . CHF (congestive heart failure) ([place])   . CHF (congestive heart failure) ([place])   . Pulmonary edema   . Cancer ([place])   . Bone cancer [facility])    Past Surgical History  Procedure Laterality Date  . Abdominal hysterectomy  2005  . Leg surgery    . Tubal ligation  1989  . Left knee surgery  2008  . Laparoscopic appendectomy N/A 06/03/2012    Procedure: APPENDECTOMY LAPAROSCOPIC;  Surgeon: Stark Klein, MD;  Location: [facility];  Service: General;  Laterality: N/A;  . Cardiac catheterization      Normal  . Cardiac catheterization N/A 09/30/2014    Procedure: Left Heart Cath and Coronary Angiography;  Surgeon: Sherren Mocha, MD;  Location: [place] [facility];  Service: Cardiovascular;  Laterality: N/A;   Family History  Problem Relation Age of Onset  . Emphysema Mother   . Arthritis Mother   . Heart failure Mother     alive @ 85  . Asthma Brother   . Heart disease Paternal Grandfather   . Heart disease Father     died @ 3's.  . Stroke Father   . Colon cancer Maternal Grandfather   . Diabetes Sister  Social History  Substance Use Topics  . Smoking status: Former Smoker -- 0.50 packs/day for 15 years    Types: Cigarettes    Quit date: 04/24/1985  . Smokeless tobacco: Never Used  . Alcohol Use: No   OB History    No data available     Review of Systems  Constitutional: Negative for fever.  Respiratory: Positive for cough and shortness of breath. Negative for wheezing.   Cardiovascular: Positive for chest pain. Negative for palpitations and leg swelling.  Gastrointestinal: Positive for nausea and diarrhea. Negative for vomiting.  Musculoskeletal: Positive for myalgias.  Skin: Negative for rash.  Neurological: Negative for dizziness and headaches.  All other systems reviewed and are negative.     Allergies  Hydrocodone; Penicillins; and Sulfa  antibiotics  Home Medications   Prior to Admission medications   Medication Sig Start Date End Date Taking? Authorizing Provider  albuterol (PROVENTIL,VENTOLIN) 90 MCG/ACT inhaler Inhale 2 puffs into the lungs every 4 (four) hours as needed for shortness of breath.    Yes Historical Provider, MD  aspirin 81 MG tablet Take 81 mg by mouth daily.   Yes Historical Provider, MD  atorvastatin (LIPITOR) 40 MG tablet Take 40 mg by mouth at bedtime.    Yes Historical Provider, MD  carvedilol (COREG CR) 10 MG 24 hr capsule Take 10 mg by mouth daily.   Yes Historical Provider, MD  furosemide (LASIX) 40 MG tablet Take 40 mg by mouth daily.   Yes Ripudeep Krystal Eaton, MD  gabapentin (NEURONTIN) 300 MG capsule Take 2 capsules (600 mg total) by mouth 3 (three) times daily. 05/27/14  Yes Reyne Dumas, MD  isosorbide dinitrate (ISORDIL) 20 MG tablet Take 20 mg by mouth 2 (two) times daily.    Yes Historical Provider, MD  KLOR-CON M10 10 MEQ tablet Take 10 mEq by mouth 2 (two) times daily.  09/23/13  Yes Historical Provider, MD  lisinopril (PRINIVIL,ZESTRIL) 10 MG tablet Take 1 tablet by mouth daily. 09/21/14  Yes Historical Provider, MD  loratadine (CLARITIN) 10 MG tablet Take 10 mg by mouth daily.   Yes Historical Provider, MD  Multiple Vitamins-Minerals (MULTIVITAMIN PO) Take 1 tablet by mouth daily.   Yes Historical Provider, MD  nitroGLYCERIN (NITROSTAT) 0.4 MG SL tablet Place 0.4 mg under the tongue every 5 (five) minutes as needed. For chest pain.   Yes Historical Provider, MD  pantoprazole (PROTONIX) 40 MG tablet Take 1 tablet (40 mg total) by mouth daily. 05/27/14  Yes Reyne Dumas, MD  Saxagliptin-Metformin (KOMBIGLYZE XR) 2.08-998 MG TB24 Take 1 tablet by mouth 2 (two) times daily.    Yes Historical Provider, MD  sertraline (ZOLOFT) 100 MG tablet Take 100 mg by mouth daily.    Yes Historical Provider, MD  ACCU-CHEK FASTCLIX LANCETS [place] USE TWICE DAILY TO CHECK SUGAR 08/27/14   Historical Provider, MD  ACCU-CHEK  SMARTVIEW test strip USE TO [place] 08/26/14   Historical Provider, MD  cephALEXin (KEFLEX) 500 MG capsule Take 1 capsule (500 mg total) by mouth 2 (two) times daily. Patient not taking: Reported on 02/26/2015 11/05/14   Gardiner Barefoot, DPM   BP 148/66 mmHg  Pulse 71  Temp(Src) 98.1 F (36.7 C)  Resp 19  SpO2 96% Physical Exam  Constitutional: She is oriented to person, place, and time. She appears well-developed and well-nourished.  HENT:  Head: Normocephalic.  Mouth/Throat: Oropharynx is clear and moist.  Eyes: Pupils are equal, round, and reactive to light.  Neck: Normal  range of motion.  Cardiovascular: Normal rate and regular rhythm.   Pulmonary/Chest: She has no wheezes. She exhibits tenderness.  Abdominal: Soft. She exhibits no distension. There is no tenderness.  Musculoskeletal: She exhibits no edema or tenderness.  Neurological: She is alert and oriented to person, place, and time.  Skin: Skin is warm and dry. No rash noted.  Nursing note and vitals reviewed.   ED Course  Procedures (including critical care time) Labs Review Labs Reviewed  CBC - Abnormal; Notable for the following:    WBC 3.6 (*)    All other components within normal limits  BASIC METABOLIC PANEL  BRAIN NATRIURETIC PEPTIDE  I-STAT TROPOININ, ED    Imaging Review Dg Chest 2 View  04/05/2015  CLINICAL DATA:  54 year old female with history of stroke gross of breath, chest pain, nausea and diarrhea. Chills and cough. EXAM: CHEST  2 VIEW COMPARISON:  Chest x-ray 09/27/2014. FINDINGS: Mild diffuse peribronchial cuffing. Lung volumes are normal. No consolidative airspace disease. No pleural effusions. No pneumothorax. No pulmonary nodule or mass noted. Pulmonary vasculature is normal. Heart size is borderline enlarged. Upper mediastinal contours are within normal limits. IMPRESSION: 1. Mild diffuse peribronchial cuffing, suggesting acute bronchitis. 2. Borderline cardiomegaly. Electronically  Signed   By: Vinnie Langton M.D.   On: 04/05/2015 18:25   I have personally reviewed and evaluated these images and lab results as part of my medical decision-making.   EKG Interpretation   Date/Time:  Monday April 05 2015 16:34:32 EST Ventricular Rate:  70 PR Interval:  166 QRS Duration: 102 QT Interval:  398 QTC Calculation: 429 R Axis:   96 Text Interpretation:  Normal sinus rhythm Lateral infarct , age  undetermined No significant change since last tracing Confirmed by  Maryan Rued  MD, Loree Fee ([id number]) on 04/05/2015 9:32:06 PM     Patient reexamined after albuterol treatment, states he feels much better.  She was questioned about her albuterol inhaler at home.  States he has helped.  She'll be provided with inhaler, as well as a spacer.  She will be taught its proper use and discharge home MDM   Final diagnoses:  Acute bronchitis, unspecified organism  Myalgia         Junius Creamer, NP 04/05/15 2144  Blanchie Dessert, MD 04/07/15 2128

## 2015-04-05 NOTE — Discharge Instructions (Signed)
Acute Bronchitis Bronchitis is when the airways that extend from the windpipe into the lungs get red, puffy, and painful (inflamed). Bronchitis often causes thick spit (mucus) to develop. This leads to a cough. A cough is the most common symptom of bronchitis. In acute bronchitis, the condition usually begins suddenly and goes away over time (usually in 2 weeks). Smoking, allergies, and asthma can make bronchitis worse. Repeated episodes of bronchitis may cause more lung problems. HOME CARE  Rest.  Drink enough fluids to keep your pee (urine) clear or pale yellow (unless you need to limit fluids as told by your doctor).  Only take over-the-counter or prescription medicines as told by your doctor.  Avoid smoking and secondhand smoke. These can make bronchitis worse. If you are a smoker, think about using nicotine gum or skin patches. Quitting smoking will help your lungs heal faster.  Reduce the chance of getting bronchitis again by:  Washing your hands often.  Avoiding people with cold symptoms.  Trying not to touch your hands to your mouth, nose, or eyes.  Follow up with your doctor as told. GET HELP IF: Your symptoms do not improve after 1 week of treatment. Symptoms include:  Cough.  Fever.  Coughing up thick spit.  Body aches.  Chest congestion.  Chills.  Shortness of breath.  Sore throat. GET HELP RIGHT AWAY IF:   You have an increased fever.  You have chills.  You have severe shortness of breath.  You have bloody thick spit (sputum).  You throw up (vomit) often.  You lose too much body fluid (dehydration).  You have a severe headache.  You faint. MAKE SURE YOU:   Understand these instructions.  Will watch your condition.  Will get help right away if you are not doing well or get worse.   This information is not intended to replace advice given to you by your health care provider. Make sure you discuss any questions you have with your health care  provider.   Document Released: 09/27/2007 Document Revised: 12/11/2012 Document Reviewed: 10/01/2012 Elsevier Interactive Patient Education 2016 [employer]. Use your inhaler on a regular basis for the next several days every 4-6 hours while awake, then as needed

## 2015-04-05 NOTE — ED Notes (Signed)
Pt here for SOB, chest pain, nausea and diarrhea. sts chills and cough.

## 2015-04-05 NOTE — ED Notes (Signed)
Reviewed d/c instructions with pt, who voiced understanding. Pt departed in wheelchair, assisted by NT Noah, and in NAD.
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
## 2015-05-28 ENCOUNTER — Other Ambulatory Visit: Payer: Self-pay

## 2015-05-28 DIAGNOSIS — Z1231 Encounter for screening mammogram for malignant neoplasm of breast: Secondary | ICD-10-CM

## 2015-06-04 ENCOUNTER — Ambulatory Visit
Admission: RE | Admit: 2015-06-04 | Discharge: 2015-06-04 | Disposition: A | Discharge status: Home / Self Care | Payer: Medicare Other | Source: Ambulatory Visit

## 2015-06-04 DIAGNOSIS — Z1231 Encounter for screening mammogram for malignant neoplasm of breast: Secondary | ICD-10-CM
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
## 2015-11-18 ENCOUNTER — Telehealth: Payer: Self-pay

## 2015-11-18 ENCOUNTER — Encounter: Payer: Self-pay | Admitting: Neurology

## 2015-11-18 ENCOUNTER — Ambulatory Visit (INDEPENDENT_AMBULATORY_CARE_PROVIDER_SITE_OTHER): Payer: Medicare Other | Admitting: Neurology

## 2015-11-18 VITALS — BP 130/82 | HR 88 | Resp 20 | Ht 60.0 in | Wt 220.0 lb

## 2015-11-18 DIAGNOSIS — G4710 Hypersomnia, unspecified: Secondary | ICD-10-CM

## 2015-11-18 DIAGNOSIS — G4733 Obstructive sleep apnea (adult) (pediatric): Secondary | ICD-10-CM | POA: Diagnosis not present

## 2015-11-18 DIAGNOSIS — Z9989 Dependence on other enabling machines and devices: Principal | ICD-10-CM

## 2015-11-18 DIAGNOSIS — R5383 Other fatigue: Secondary | ICD-10-CM | POA: Diagnosis not present

## 2015-11-18 DIAGNOSIS — G4730 Sleep apnea, unspecified: Secondary | ICD-10-CM

## 2015-11-18 NOTE — Telephone Encounter (Signed)
I spoke to pt and advised her of this information. She knows that I will call her in a month to get a download. Pt verbalized understanding.

## 2015-11-18 NOTE — Telephone Encounter (Signed)
Please see documentation from [facility] and let me know what you want to do.  2 week auto titration and then set to a single setting pressure OR a new auto machine with auto settings

## 2015-11-18 NOTE — Progress Notes (Signed)
SLEEP MEDICINE CLINIC   Provider:  Larey Seat, M D  Referring Provider: Velna Hatchet, MD Primary Care Physician:  Velna Hatchet, MD  Chief Complaint  Patient presents with  . Follow-up    needs "something different" for her cpap    HPI:  [name] is a 55 y.o. female  ,seen here as a referral from Dr. Ardeth Perfect for a sleep medicine consultation,  [name], a right-handed 55 year old African-American female is seen here today upon referral by Dr. Ardeth Perfect.  She has been on CPAP for many years,  After being diagnosed in 2011 at [facility] long sleep lab,  but had no medical follow-up regarding her OSA treatment for at least 3 years, according to her. The patient is established with a local pulmonologist and was diagnosed with obstructive sleep apnea as well as obesity hypoventilation syndrome.  She underwent arterial blood gas testing on 10-28-12 and PFTs pulmonary function tests on 08-02-12.  There is a history of congestive heart failure /diastolic failure,  diabetes mellitus II, hypertension and hyperlipidemia.   The patient reports she feels extremely fatigued , and sleeps whenever not stimulated or physically active. First she thought it was due to depression , but she has a lot of physical factors contributing.  She goes to bed at 10.30 PM and has trouble falling asleep, she sleeps through for 3 hours before her first bathroom break,  usually has 2 nocturias.  Her CPAP helps her to sleep sounder, it has not been adjusted. [facility] is her DME. She feels sometimes the CPAP is smothering her and often she took the mask off during sleep and didn't realize it until AM.  She rises at 7 AM to go ready for work.  She has a very dry mouth in AM.  She has breakfast at home before driving to work. Caffee in AM, 12 ounces. She works from 11 to 14.oo hours . At work, she does not have to fight sleepiness as she is active and stimulated, but as soon as she leaves work and sits  in her car she feels very drowsy and has an irresistible urge to sleep.  At home,  she often takes inadvertently naps. She often falls asleep in front of the TV. She may get between 8 and 12 hours of sleep in total in a 24-hour period.  She was told during a hospital based test last month that her oxygen level is low.  I was able to obtain to compliance reports one for 30 and 1 for 90 days today the 90 day download shows that the machine was only used on 26 days and only 6 of those days over 4 hours duration. The set pressure is 17 cm with an EPR level 3 cm water the residual AHI is 2.3 the patient is a very high air leak and all residual apneas are obstructive in nature. I will discuss with her to use a smaller nasal mask and perhaps use a nasal spray to make sure that the airflow through the nose is unrestricted. [name] also has retrograde via and I believe that a full face mask can worsen this condition. I will ask her to return to the sleep lab for an overnight titration and to also see if additional oxygen may be needed at night to treat her fatigue.  Interval history from 11/18/2015.  As described in her last visit with me the patient's compliance to CPAP has been poor. My request to allow her  a re-titration observed in the lab has been denied and the patient had her last sleep study in form of a PSG with all titration on 04/28/2014. At the time her AHI was only 7.1 but she had signs of CO2 retention to 52.2 torr and prolonged hypoxemia. She remains non compliant, sleeps often before she "can get it on ". " I still feel sleepy when using it" She spoke to [place] at [facility] , who suggested a different interface.    SOCIAL ; The patient is divorced, had her first child at age 30 and was married at 78 - 26 years of age. She is recently part time gainfully employed at [employer], he has a GED- degree and attended college for 2 years without degree, she doesn't smoke, doesn't drink alcohol and has a  moderate intake of caffeine , one cup of coffee and up to 2 glasses of iced tea. Rarely a soda. 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 She lived in [place] for several years as her husband was stationed in [place], [place].    Review of Systems: Out of a complete 14 system review, the patient complains of only the following symptoms, and all other reviewed systems are negative. She has suffered from recurrent skin infections related to diabetes. She also has left knee arthritis and right shoulder arthritis and degenerative osteoarthritis. Her last hospitalization was in 2014 for appendicectomy. The patient's review of systems is positive for shortness of breath, wheezing, recurrent snoring, swelling in her legs, skin itching allergies, aching muscles arthritic joint pain and swelling. Depression and anxiety, headaches numbness and restless legs.  Epworth score 14 , Fatigue severity score 44  ,  Geriatric depression score 4.   Social History   Social History  . Marital status: Legally Separated    Spouse name: N/A  . Number of children: N/A  . Years of education: N/A   Occupational History  . UNEMPLOYED Disabled   Social History Main Topics  . Smoking status: Former Smoker    Packs/day: 0.50    Years: 15.00    Types: Cigarettes    Quit date: 04/24/1985  . Smokeless tobacco: Never Used  . Alcohol use No  . Drug use: No  . Sexual activity: No   Other Topics Concern  . Not on file   Social History Narrative   Lives in [place] by herself.  Disabled.  Caffeine 1 cup avg daily.  3 kids, 6 grandkids.      Family History  Problem Relation Age of Onset  . Emphysema Mother   . Arthritis Mother   . Heart failure Mother     alive @ 55  . Asthma Brother   . Heart disease Paternal Grandfather   . Heart disease Father     died @ 87's.  . Stroke Father   . Colon cancer Maternal Grandfather   . Diabetes Sister     Past Medical History:  Diagnosis Date  .  Anginal pain ([place])    a. NL cath in 2008;  b. Myoview 03/2011: dec uptake along mid anterior wall on stress imaging -> ? attenuation vs. ischemia, EF 65%;  c. Echo 04/2011: EF 55-60%, no RWMA, Gr 2 dd  . Anxiety   . Asthma   . Bone cancer ([place])   . Cancer ([place])   . CHF (congestive heart failure) ([place])   . CHF (congestive heart failure) ([place])   . Depression   .  Diabetes mellitus   . Hypertension   . 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 Obesity   . Pulmonary edema   . Pulmonary embolism ([place])    a. 2008 -> coumadin x 6 mos.  . Sleep apnea     Past Surgical History:  Procedure Laterality Date  . ABDOMINAL HYSTERECTOMY  2005  . CARDIAC CATHETERIZATION     Normal  . CARDIAC CATHETERIZATION N/A 09/30/2014   Procedure: Left Heart Cath and Coronary Angiography;  Surgeon: Sherren Mocha, MD;  Location: [facility];  Service: Cardiovascular;  Laterality: N/A;  . LAPAROSCOPIC APPENDECTOMY N/A 06/03/2012   Procedure: APPENDECTOMY LAPAROSCOPIC;  Surgeon: Stark Klein, MD;  Location: [facility];  Service: General;  Laterality: N/A;  . Left knee surgery  2008  . LEG SURGERY    . TUBAL LIGATION  1989    Current Outpatient Prescriptions  Medication Sig Dispense Refill  . ACCU-CHEK FASTCLIX LANCETS MISC USE TWICE DAILY TO CHECK SUGAR  6  . ACCU-CHEK SMARTVIEW test strip USE TO CHECK SUGAR TWICE DAILY  5  . albuterol (PROVENTIL,VENTOLIN) 90 MCG/ACT inhaler Inhale 2 puffs into the lungs every 4 (four) hours as needed for shortness of breath.     [name] aspirin 81 MG tablet Take 81 mg by mouth daily.    [name] atorvastatin (LIPITOR) 40 MG tablet Take 40 mg by mouth at bedtime.     . carvedilol (COREG CR) 10 MG 24 hr capsule Take 10 mg by mouth daily.    . cephALEXin (KEFLEX) 500 MG capsule Take 1 capsule (500 mg total) by mouth 2 (two) times daily. 15 capsule 0  . furosemide (LASIX) 40 MG tablet Take 40 mg by mouth daily.    [name] gabapentin (NEURONTIN) 300 MG capsule Take 2 capsules (600  mg total) by mouth 3 (three) times daily. 180 capsule 2  . isosorbide dinitrate (ISORDIL) 20 MG tablet Take 20 mg by mouth 2 (two) times daily.     [name] KLOR-CON M10 10 MEQ tablet Take 10 mEq by mouth 2 (two) times daily.     [name] KOMBIGLYZE XR 5-500 MG TB24     . lisinopril (PRINIVIL,ZESTRIL) 10 MG tablet Take 1 tablet by mouth daily.    [name] loratadine (CLARITIN) 10 MG tablet Take 10 mg by mouth daily.    . Multiple Vitamins-Minerals (MULTIVITAMIN PO) Take 1 tablet by mouth daily.    . nitroGLYCERIN (NITROSTAT) 0.4 MG SL tablet Place 0.4 mg under the tongue every 5 (five) minutes as needed. For chest pain.    . pantoprazole (PROTONIX) 40 MG tablet Take 1 tablet (40 mg total) by mouth daily. 30 tablet 0  . Saxagliptin-Metformin (KOMBIGLYZE XR) 2.08-998 MG TB24 Take 1 tablet by mouth 2 (two) times daily.     . sertraline (ZOLOFT) 100 MG tablet Take 100 mg by mouth daily.      No current facility-administered medications for this visit.     Allergies as of 11/18/2015 - Review Complete 11/18/2015  Allergen Reaction Noted  . Hydrocodone Other (See Comments) 07/21/2012  . Penicillins Swelling   . Sulfa antibiotics Diarrhea, Itching, and Rash 10/07/2011    Vitals: BP 130/82   Pulse 88   Resp 20   Ht 5' (1.524 m)   Wt 220 lb (99.8 kg)   BMI 42.97 kg/m  Last Weight:  Wt Readings from Last 1 Encounters:  11/18/15 220 lb (99.8 kg)       Last  Height:   Ht Readings from Last 1 Encounters:  11/18/15 5' (1.524 m)    Physical exam:  General: The patient is awake, alert and appears not in acute distress. The patient is obese.  Head: Normocephalic, atraumatic. Neck is supple. Mallampati 4,  neck circumference: 17. Nasal airflow unrestricted , TMJ is  Not evident. Retrognathia is seen !.  Cardiovascular:  Regular rate and rhythm , without  murmurs or carotid bruit, and without distended neck veins. Respiratory: Lungs are clear to auscultation. Skin:  Without evidence of edema, or rash Trunk: BMI  is elevated and patient  has normal posture.  Neurologic exam : The patient is awake and alert, oriented to place and time.   Memory subjective  described as intact.  There is a normal attention span & concentration ability.  Speech is fluent without dysarthria, dysphonia or aphasia. Mood and affect are depressed . Cranial nerves: Pupils are equal and briskly reactive to light. Funduscopic exam without  evidence of pallor or edema. Extraocular movements  in vertical and horizontal planes intact , but there is coarse saccades noted with left to right gaze, and not a smooth pursuit . Visual fields by finger perimetry are intact. Hearing to finger rub intact.  Facial sensation intact to fine touch. Facial motor strength is symmetric and tongue and uvula move midline.  Motor exam:  Normal tone muscle bulk and symmetric strength in all extremities.  Sensory:  Fine touch, pinprick and vibration were tested in all extremities. Proprioception is normal. Coordination: Rapid alternating movements in the fingers/hands is normal. Finger-to-nose maneuver normal without evidence of ataxia, dysmetria or tremor.  Gait and station: Patient walks without assistive device and is able unassisted to climb up to the exam table. Strength within normal limits.  Stance is stable and normal.Tandem gait is unfragmented. Romberg testing is negative.  Deep tendon reflexes: in the  upper and lower extremities are symmetric and intact. Babinski maneuver is downgoing.   Assessment:  After physical and neurologic examination, review of laboratory studies, imaging, neurophysiology testing and pre-existing records, assessment is  1) morbid obesity and CHF, former smoker with reported hypoxemia. OSA basically untreated  Due to non compliance. I advised the patient to have a SPLIT study and get the new settings applied to her current machine.  I had changed  her to a nasal pillow  , as not to worsen the retrognathia.       The patient was advised of the nature of the diagnosed sleep disorder , the treatment options and risks for general a health and wellness arising from not treating the condition. Visit duration was 25 minutes.   Plan:  Treatment plan and additional workup : 1) confirmed OSA on CPAP, compliance here is poor. 50 % , average user time 3 hours and 20 minutes. 16 cm EPR of 3.  The patient remains excessively daytime sleepy at an Epworth score of 13 points and the fatigue score 47 points. She appears depressed. She is treated on medication.  ZOLOFT.  2) I changed the mask / interface now and ask Jonni Sanger at [facility] to give her an auto- setting from 12 to 18 cm water, with 3 cm water EPR. This certainly my interest in her last visit with me, but the patient's CPAP was set to 16 cm water pressure. She has a significant number of air leaks and an amount that is likely contributing to her feeling not as restored. When she uses CPAP however her AHI is 1.1  at a setting of 16 cm water. CPAP since 2011.   3) BMI- low carb diet discussed. This will help her diabetes, and hypertension. She is walking her dog has more physical activity this way, she also swims at the [facility].      Asencion Partridge Shruti Arrey MD  11/18/2015

## 2015-11-18 NOTE — Telephone Encounter (Signed)
Please inform [name] that she has to use her machine the next 30 days each night and each night 6-8 hours. After I have that download I will offer her a new AUTO  Machine 5-15 cm water . If she can't prove compliance she will not be longer an active patient at [facility].

## 2015-11-18 NOTE — Telephone Encounter (Signed)
-----   Message from Patsy Baltimore sent at 11/18/2015  3:20 PM EDT ----- Madaline Brilliant sorry I was wrong the repair rule is no longer being followed by insurances if the unit is over 56 years old and her's is. Therefore she would have the below options as her unit will not allow auto setting.    -We can do 2 week auto titration and send download to Dr. Brett Fairy to provide [name] with a set pressure to set her current machine at  OR -She can get a new unit and if doctor writes for an auto pressure setting, she will automatically get an auto machine.  Yes we can provide her a new interface if we can get a compliant download so that her insurance will cover the supplies.   Let me know how you'd like to proceed. Thanks.    ----- Message ----- From: Lester Stevenson, RN Sent: 11/18/2015   2:10 PM To: Patsy Baltimore  Hi Angie,  Yes, Dr. Brett Fairy wants her to have an auto cpap. Is that machine capable of an auto function? And can y'all fit her for a new interface?   Thanks! Let me know if you need something from [name]. ----- Message ----- From: Patsy [place] Sent: 11/18/2015   1:56 PM To: Leeroy Bock, Matthias Bogus, RN  [employer], Yes this is our pt for PAP therapy. She has a Resmed S9 unit which is still being repaired by the manufacturer therefore insurance will require she go through the repair process first.  Let me know if you'd like [name] to contact pt to get that started. Thanks.   Angie   ----- Message ----- From: Lester Mentone, RN Sent: 11/18/2015   9:14 AM To: Patsy Baltimore  Is this a pt of yours? If so, we need to know if she can have a new cpap and needs a new interface.

## 2015-12-14 ENCOUNTER — Encounter (HOSPITAL_COMMUNITY): Payer: Self-pay | Admitting: Emergency Medicine

## 2015-12-14 ENCOUNTER — Emergency Department (HOSPITAL_COMMUNITY)
Admission: EM | Admit: 2015-12-14 | Discharge: 2015-12-14 | Disposition: A | Discharge status: Home / Self Care | Payer: Medicare Other | Attending: Emergency Medicine | Admitting: Emergency Medicine

## 2015-12-14 ENCOUNTER — Emergency Department (HOSPITAL_COMMUNITY): Payer: Medicare Other

## 2015-12-14 DIAGNOSIS — Z87891 Personal history of nicotine dependence: Secondary | ICD-10-CM | POA: Diagnosis not present

## 2015-12-14 DIAGNOSIS — I509 Heart failure, unspecified: Secondary | ICD-10-CM | POA: Insufficient documentation

## 2015-12-14 DIAGNOSIS — J069 Acute upper respiratory infection, unspecified: Secondary | ICD-10-CM | POA: Diagnosis not present

## 2015-12-14 DIAGNOSIS — J45909 Unspecified asthma, uncomplicated: Secondary | ICD-10-CM | POA: Diagnosis not present

## 2015-12-14 DIAGNOSIS — R51 Headache: Secondary | ICD-10-CM | POA: Diagnosis not present

## 2015-12-14 DIAGNOSIS — Z85830 Personal history of malignant neoplasm of bone: Secondary | ICD-10-CM | POA: Diagnosis not present

## 2015-12-14 DIAGNOSIS — R079 Chest pain, unspecified: Secondary | ICD-10-CM | POA: Diagnosis present

## 2015-12-14 DIAGNOSIS — Z7984 Long term (current) use of oral hypoglycemic drugs: Secondary | ICD-10-CM | POA: Insufficient documentation

## 2015-12-14 DIAGNOSIS — I110 Hypertensive heart disease with heart failure: Secondary | ICD-10-CM | POA: Insufficient documentation

## 2015-12-14 DIAGNOSIS — R0789 Other chest pain: Secondary | ICD-10-CM | POA: Insufficient documentation

## 2015-12-14 DIAGNOSIS — E119 Type 2 diabetes mellitus without complications: Secondary | ICD-10-CM | POA: Insufficient documentation

## 2015-12-14 DIAGNOSIS — R519 Headache, unspecified: Secondary | ICD-10-CM

## 2015-12-14 DIAGNOSIS — Z7982 Long term (current) use of aspirin: Secondary | ICD-10-CM | POA: Insufficient documentation

## 2015-12-14 LAB — URINALYSIS, ROUTINE W REFLEX MICROSCOPIC
Bilirubin Urine: NEGATIVE
Glucose, UA: NEGATIVE mg/dL
Hgb urine dipstick: NEGATIVE
Ketones, ur: NEGATIVE mg/dL
Leukocytes, UA: NEGATIVE
Nitrite: NEGATIVE
Protein, ur: NEGATIVE mg/dL
Specific Gravity, Urine: 1.021 (ref 1.005–1.030)
pH: 6 (ref 5.0–8.0)

## 2015-12-14 LAB — BASIC METABOLIC PANEL
Anion gap: 7 (ref 5–15)
BUN: 13 mg/dL (ref 6–20)
CO2: 27 mmol/L (ref 22–32)
Calcium: 9.7 mg/dL (ref 8.9–10.3)
Chloride: 105 mmol/L (ref 101–111)
Creatinine, Ser: 0.9 mg/dL (ref 0.44–1.00)
GFR calc Af Amer: >60 mL/min (ref >60)
GFR calc non Af Amer: >60 mL/min (ref >60)
Glucose, Bld: 121 mg/dL — ABNORMAL HIGH (ref 65–99)
Potassium: 3.6 mmol/L (ref 3.5–5.1)
Sodium: 139 mmol/L (ref 135–145)

## 2015-12-14 LAB — CBC
HCT: 38.5 % (ref 36.0–46.0)
Hemoglobin: 12 g/dL (ref 12.0–15.0)
MCH: 27.7 pg (ref 26.0–34.0)
MCHC: 31.2 g/dL (ref 30.0–36.0)
MCV: 88.9 fL (ref 78.0–100.0)
Platelets: 223 10*3/uL (ref 150–400)
RBC: 4.33 MIL/uL (ref 3.87–5.11)
RDW: 13.8 % (ref 11.5–15.5)
WBC: 7 10*3/uL (ref 4.0–10.5)

## 2015-12-14 LAB — I-STAT TROPONIN, ED: Troponin i, poc: 0.02 ng/mL (ref 0.00–0.08)

## 2015-12-14 MED ORDER — METOCLOPRAMIDE HCL 5 MG/ML IJ SOLN
10.0000 mg | Freq: Once | INTRAMUSCULAR | Status: AC
  Administered 2015-12-14: 10 mg via INTRAVENOUS
  Filled 2015-12-14: qty 2

## 2015-12-14 MED ORDER — KETOROLAC TROMETHAMINE 30 MG/ML IJ SOLN
30.0000 mg | Freq: Once | INTRAMUSCULAR | Status: AC
Start: 2015-12-14 — End: 2015-12-14
  Administered 2015-12-14: 30 mg via INTRAVENOUS
  Filled 2015-12-14: qty 1

## 2015-12-14 MED ORDER — DIPHENHYDRAMINE HCL 50 MG/ML IJ SOLN
25.0000 mg | Freq: Once | INTRAMUSCULAR | Status: AC
  Administered 2015-12-14: 25 mg via INTRAVENOUS
  Filled 2015-12-14: qty 1

## 2015-12-14 MED ORDER — SODIUM CHLORIDE 0.9 % IV BOLUS (SEPSIS)
1000.0000 mL | Freq: Once | INTRAVENOUS | Status: AC
  Administered 2015-12-14: 1000 mL via INTRAVENOUS

## 2015-12-14 NOTE — ED Triage Notes (Signed)
Patient reports central chest pain radiating to upper shoulders , SOB , low back pain , chills and headache onset this week .

## 2015-12-14 NOTE — Discharge Instructions (Signed)
Your labs and urine today were normal. Your chest x-ray showed possible bronchitis. This is likely caused by a viral illness and you do not need to be on antibiotics as it is not a bacterial infection. You may alternate between Tylenol 1000 mg every 6 hours as needed for fever and pain and ibuprofen 800 mg every 8 hours as needed for fever and pain. Please follow-up with your primary care physician if symptoms continue or worsen.

## 2015-12-14 NOTE — ED Provider Notes (Signed)
TIME SEEN: 5:15 AM  CHIEF COMPLAINT: Multiple complaints  HPI: Pt is a 55 y.o. female with history of hypertension, diabetes, CHF who presents to the emergency department with multiple complaints. Patient states that she started feeling poorly yesterday morning. States she had shortness of breath, chills, headache, cough with clear sputum production. Reports she has had body aches and around 8 PM I sent to the having constant sharp diffuse chest pain is worse with lying flat. Has had nausea but no vomiting. Has had diarrhea. No abdominal pain. No dysuria or hematuria. Has had similar headaches in the past. Did not take any medications prior to arrival. No numbness or focal weakness. No history of head injury. No sick contacts or recent travel outside of the country. No lower extremity swelling or pain.  ROS: See HPI Constitutional: no fever  Eyes: no drainage  ENT: no runny nose   Cardiovascular:  chest pain  Resp:  SOB  GI: no vomiting GU: no dysuria Integumentary: no rash  Allergy: no hives  Musculoskeletal: no leg swelling  Neurological: no slurred speech ROS otherwise negative  PAST MEDICAL HISTORY/PAST SURGICAL HISTORY:  Past Medical History:  Diagnosis Date  . Anginal pain ([place])    a. NL cath in 2008;  b. Myoview 03/2011: dec uptake along mid anterior wall on stress imaging -> ? attenuation vs. ischemia, EF 65%;  c. Echo 04/2011: EF 55-60%, no RWMA, Gr 2 dd  . Anxiety   . Asthma   . Bone cancer ([place])   . Cancer ([place])   . CHF (congestive heart failure) ([place])   . CHF (congestive heart failure) ([place])   . Depression   . Diabetes mellitus   . Hypertension   . 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 Obesity   . Pulmonary edema   . Pulmonary embolism ([place])    a. 2008 -> coumadin x 6 mos.  . Sleep apnea     MEDICATIONS:  Prior to Admission medications   Medication Sig Start Date End Date Taking? Authorizing Provider  ACCU-CHEK FASTCLIX LANCETS [place] USE TWICE  DAILY TO CHECK SUGAR 08/27/14   Historical Provider, MD  [place] test strip USE TO [place] 08/26/14   Historical Provider, MD  albuterol (PROVENTIL,VENTOLIN) 90 MCG/ACT inhaler Inhale 2 puffs into the lungs every 4 (four) hours as needed for shortness of breath.     Historical Provider, MD  aspirin 81 MG tablet Take 81 mg by mouth daily.    Historical Provider, MD  atorvastatin (LIPITOR) 40 MG tablet Take 40 mg by mouth at bedtime.     Historical Provider, MD  carvedilol (COREG CR) 10 MG 24 hr capsule Take 10 mg by mouth daily.    Historical Provider, MD  cephALEXin (KEFLEX) 500 MG capsule Take 1 capsule (500 mg total) by mouth 2 (two) times daily. 11/05/14   Gardiner Barefoot, DPM  furosemide (LASIX) 40 MG tablet Take 40 mg by mouth daily.    Ripudeep Krystal Eaton, MD  gabapentin (NEURONTIN) 300 MG capsule Take 2 capsules (600 mg total) by mouth 3 (three) times daily. 05/27/14   Reyne Dumas, MD  isosorbide dinitrate (ISORDIL) 20 MG tablet Take 20 mg by mouth 2 (two) times daily.     Historical Provider, MD  KLOR-CON M10 10 MEQ tablet Take 10 mEq by mouth 2 (two) times daily.  09/23/13   Historical Provider, MD  KOMBIGLYZE XR 5-500 MG TB24  10/11/15  Historical Provider, MD  lisinopril (PRINIVIL,ZESTRIL) 10 MG tablet Take 1 tablet by mouth daily. 09/21/14   Historical Provider, MD  loratadine (CLARITIN) 10 MG tablet Take 10 mg by mouth daily.    Historical Provider, MD  Multiple Vitamins-Minerals (MULTIVITAMIN PO) Take 1 tablet by mouth daily.    Historical Provider, MD  nitroGLYCERIN (NITROSTAT) 0.4 MG SL tablet Place 0.4 mg under the tongue every 5 (five) minutes as needed. For chest pain.    Historical Provider, MD  pantoprazole (PROTONIX) 40 MG tablet Take 1 tablet (40 mg total) by mouth daily. 05/27/14   Reyne Dumas, MD  Saxagliptin-Metformin (KOMBIGLYZE XR) 2.08-998 MG TB24 Take 1 tablet by mouth 2 (two) times daily.     Historical Provider, MD  sertraline (ZOLOFT) 100 MG tablet Take  100 mg by mouth daily.     Historical Provider, MD    ALLERGIES:  Allergies  Allergen Reactions  . Hydrocodone Other (See Comments)    Causes Hallucinations  . Penicillins Swelling    Has patient had a PCN reaction causing immediate rash, facial/tongue/throat swelling, SOB or lightheadedness with hypotension: YES Has patient had a PCN reaction causing severe rash involving mucus membranes or skin necrosis: NO Has patient had a PCN reaction that required hospitalization NO Has patient had a PCN reaction occurring within the last 10 years: NO If all of the above answers are "NO", then may proceed with Cephalosporin use.  . Sulfa Antibiotics Diarrhea, Itching and Rash    SOCIAL HISTORY:  Social History  Substance Use Topics  . Smoking status: Former Smoker    Packs/day: 0.50    Years: 15.00    Types: Cigarettes    Quit date: 04/24/1985  . Smokeless tobacco: Never Used  . Alcohol use No    FAMILY HISTORY: Family History  Problem Relation Age of Onset  . Emphysema Mother   . Arthritis Mother   . Heart failure Mother     alive @ 33  . Asthma Brother   . Heart disease Father     died @ 80's.  . Stroke Father   . Diabetes Sister   . Heart disease Paternal Grandfather   . Colon cancer Maternal Grandfather     EXAM: BP 140/89 (BP Location: Left Arm)   Pulse 68   Temp 98.3 F (36.8 C) (Oral)   Resp 16   Ht 4\' 11"  (1.499 m)   Wt 225 lb (102.1 kg)   SpO2 99%   BMI 45.44 kg/m  CONSTITUTIONAL: Alert and oriented and responds appropriately to questions. Well-appearing; well-nourished, Afebrile and nontoxic HEAD: Normocephalic EYES: Conjunctivae clear, PERRL ENT: normal nose; no rhinorrhea; moist mucous membranes NECK: Supple, no meningismus, no LAD, No nuchal rigidity  CARD: RRR; S1 and S2 appreciated; no murmurs, no clicks, no rubs, no gallops RESP: Normal chest excursion without splinting or tachypnea; breath sounds clear and equal bilaterally; no wheezes, no rhonchi, no  rales, no hypoxia or respiratory distress, speaking full sentences ABD/GI: Normal bowel sounds; non-distended; soft, non-tender, no rebound, no guarding, no peritoneal signs BACK:  The back appears normal and is non-tender to palpation, there is no CVA tenderness EXT: Normal ROM in all joints; non-tender to palpation; no edema; normal capillary refill; no cyanosis, no calf tenderness or swelling; 2+ radial and DP pulses bilaterally  SKIN: Normal color for age and race; warm; no rash NEURO: Moves all extremities equally, sensation to light touch intact diffusely, cranial nerves II through XII intact, Normal gait PSYCH: The patient's  mood and manner are appropriate. Grooming and personal hygiene are appropriate.  MEDICAL DECISION MAKING: Patient here with multiple different complaints. Her chest pain seems very atypical. No fever, meningismus or neurologic deficits on exam. Doubt intracranial hemorrhage, infectious etiology for her headache. I suspect that this is a viral illness that she is having myalgias, subjective fever and chills, dry cough. We'll obtain labs, chest x-ray, urinalysis. We'll treat her headache with Toradol, IV fluids, Reglan and Benadryl and reassess. Does report she has had similar headaches in the past.  ED PROGRESS: Patient's labs are unremarkable. Troponin is negative and this is almost 12 hours after the onset of symptoms of chest pain which she reports is been constant since 5 PM yesterday. Her report of chest pain is very atypical. I doubt this is ACS, PE or dissection. She does not appear volume overloaded. EKG shows no new ischemic change, arrhythmia, interval abnormality. Given pain is been constant for over 12 hours I do not feel she needs serial troponins. Chest pain and body aches have improved and have resolved with Toradol.  Chest x-ray looks like she probably has bronchitis but no infiltrate that will need antibiotics. Again suspect a viral illness.   Urine shows no  sign of infection.   Otherwise her labs are unremarkable including normal electrolytes, hemoglobin.   Her headache has completely resolved.  She reports feeling much better.  Again I suspect viral illness. Chest pain is very atypical. No neurologic deficits. I do not feel she needs antibiotics at this time.   I feel patient is safe to be discharged home. Discussed return precautions. She is comfortable with this plan. States she has a PCP for follow-up.    At this time, I do not feel there is any life-threatening condition present. I have reviewed and discussed all results (EKG, imaging, lab, urine as appropriate), exam findings with patient/family. I have reviewed nursing notes and appropriate previous records.  I feel the patient is safe to be discharged home without further emergent workup and can continue workup as an outpatient. Discussed usual and customary return precautions. Patient/family verbalize understanding and are comfortable with this plan.  Outpatient follow-up has been provided. All questions have been answered.      EKG Interpretation  Date/Time:  Tuesday December 14 2015 03:48:46 EDT Ventricular Rate:  72 PR Interval:  186 QRS Duration: 102 QT Interval:  406 QTC Calculation: 444 R Axis:   57 Text Interpretation:  Normal sinus rhythm Normal ECG No significant change since last tracing Confirmed by Deniyah Dillavou,  DO, Rebecca Cairns [phone]) on 12/14/2015 6:44:16 AM          [place], DO 12/14/15 0725

## 2015-12-20 NOTE — Telephone Encounter (Signed)
I called pt to see how she was doing on cpap. Pt says "good". She will bring me her cpap machine to download tomorrow to check compliance to see if Dr. Brett Fairy will order her a new auto cpap. Pt verbalized understanding.

## 2015-12-22 NOTE — Telephone Encounter (Signed)
Received pt's therapy report. Pt has used cpap greater than four hours for 28/30 days (93%). AHI of 0.8.  Dr. Brett Fairy reviewed report and said that pt may receive a new machine, set at 16 cm H2O.  I have reached out to [facility] to find out if she is eligible for a new machine.

## 2015-12-23 ENCOUNTER — Telehealth: Payer: Self-pay

## 2015-12-23 NOTE — Telephone Encounter (Signed)
[facility] will not issue pt a new cpap until she has another visit with Dr. Brett Fairy or the NP discussing her compliant use of her cpap. The download of her cpap that shows compliance is not enough.  I called to explain this to the pt and offer an appt with Jinny Blossom, NP so we can get her a new cpap. No answer, left a message asking her to call me back.

## 2015-12-23 NOTE — Telephone Encounter (Signed)
-----   Message from [name] sent at 12/22/2015 10:12 AM EDT ----- [name],  Yes ma'am this pt is eligible for a new unit at this time.  We would need a order with pressure settings, recent OV note discussing the pap therapy usage and benefit and a copy of her original diagnostic sleep study.   Thanks. Angie  ----- Message ----- From: Tara Coamo, RN Sent: 12/21/2015   5:00 PM To: [name]  Is this pt eligible for a new cpap?

## 2015-12-23 NOTE — Telephone Encounter (Signed)
I spoke to pt. She is agreeable to coming in to see Jinny Blossom, NP next week. Appt made.  Once Megan, NP documents CPAP compliance, and orders a new cpap set at 16 cm H2O, [facility] will issue the new cpap.  Pt verbalized understanding.

## 2015-12-29 ENCOUNTER — Encounter: Payer: Self-pay | Admitting: Adult Health

## 2015-12-29 ENCOUNTER — Ambulatory Visit (INDEPENDENT_AMBULATORY_CARE_PROVIDER_SITE_OTHER): Payer: Medicare Other | Admitting: Adult Health

## 2015-12-29 VITALS — BP 115/69 | HR 78 | Ht 60.0 in | Wt 219.0 lb

## 2015-12-29 DIAGNOSIS — Z5181 Encounter for therapeutic drug level monitoring: Secondary | ICD-10-CM | POA: Diagnosis not present

## 2015-12-29 DIAGNOSIS — G4733 Obstructive sleep apnea (adult) (pediatric): Secondary | ICD-10-CM | POA: Diagnosis not present

## 2015-12-29 DIAGNOSIS — Z9989 Dependence on other enabling machines and devices: Principal | ICD-10-CM

## 2015-12-29 NOTE — Progress Notes (Addendum)
PATIENT: [name] DOB: [birth year]  REASON FOR VISIT: follow up- OSA on cpap HISTORY FROM: patient  HISTORY OF PRESENT ILLNESS: [name] is a 55 year old female with a history of obstructive sleep apnea on CPAP. She returns today for a compliance download. She is currently using her machine 30 out of 30 days for compliance of 100%. She primarily uses her machine each night greater than 4 hours. Her residual AHI is 0.8 cm water with EPR of 3. The patient does not have a significant leak. Patient's Epworth sleepiness score is 12 and fatigue severity score is 35. She is currently using a full face mask. She is asking about potentially trying the dream wear mask. She is unsure if she is a mouth breather. The patient also states that when she is using machine she cannot hear her phone ringing. She states that while she has the mask on and she is reading a book she hears the phone fine. Unsure of her ears or "stopped up." She returns today for an evaluation.  HISTORY 11/18/15:[name] is a 55 y.o. female  ,seen here as a referral from Dr. Ardeth Perfect for a sleep medicine consultation,  [name], a right-handed 55 year old African-American female is seen here today upon referral by Dr. Ardeth Perfect.  She has been on CPAP for many years,  After being diagnosed in 2011 at [facility] long sleep lab,  but had no medical follow-up regarding her OSA treatment for at least 3 years, according to her. The patient is established with a local pulmonologist and was diagnosed with obstructive sleep apnea as well as obesity hypoventilation syndrome.  She underwent arterial blood gas testing on 10-28-12 and PFTs pulmonary function tests on 08-02-12.  There is a history of congestive heart failure /diastolic failure,  diabetes mellitus II, hypertension and hyperlipidemia.   The patient reports she feels extremely fatigued , and sleeps whenever not stimulated or physically active. First she thought it  was due to depression , but she has a lot of physical factors contributing.  She goes to bed at 10.30 PM and has trouble falling asleep, she sleeps through for 3 hours before her first bathroom break,  usually has 2 nocturias.  Her CPAP helps her to sleep sounder, it has not been adjusted. [facility] is her DME. She feels sometimes the CPAP is smothering her and often she took the mask off during sleep and didn't realize it until AM.  She rises at 7 AM to go ready for work.  She has a very dry mouth in AM.  She has breakfast at home before driving to work. Caffee in AM, 12 ounces. She works from 11 to 14.oo hours . At work, she does not have to fight sleepiness as she is active and stimulated, but as soon as she leaves work and sits in her car she feels very drowsy and has an irresistible urge to sleep.  At home,  she often takes inadvertently naps. She often falls asleep in front of the TV. She may get between 8 and 12 hours of sleep in total in a 24-hour period.  She was told during a hospital based test last month that her oxygen level is low.  I was able to obtain to compliance reports one for 30 and 1 for 90 days today the 90 day download shows that the machine was only used on 26 days and only 6 of those days over 4 hours duration. The set pressure is  17 cm with an EPR level 3 cm water the residual AHI is 2.3 the patient is a very high air leak and all residual apneas are obstructive in nature. I will discuss with her to use a smaller nasal mask and perhaps use a nasal spray to make sure that the airflow through the nose is unrestricted. [name] also has retrograde via and I believe that a full face mask can worsen this condition. I will ask her to return to the sleep lab for an overnight titration and to also see if additional oxygen may be needed at night to treat her fatigue.  Interval history from 11/18/2015.  As described in her last visit with me the patient's compliance to CPAP  has been poor. My request to allow her a re-titration observed in the lab has been denied and the patient had her last sleep study in form of a PSG with all titration on 04/28/2014. At the time her AHI was only 7.1 but she had signs of CO2 retention to 52.2 torr and prolonged hypoxemia. She remains non compliant, sleeps often before she "can get it on ". " I still feel sleepy when using it" She spoke to [place] at [facility] , who suggested a different interface.    SOCIAL ; The patient is divorced, had her first child at age 35 and was married at 60 - 32 years of age. She is recently part time gainfully employed at [employer], he has a GED- degree and attended college for 2 years without degree, she doesn't smoke, doesn't drink alcohol and has a moderate intake of caffeine , one cup of coffee and up to 2 glasses of iced tea. Rarely a soda. 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 She lived in [place] for several years as her husband was stationed in [place], [place].     REVIEW OF SYSTEMS: Out of a complete 14 system review of symptoms, the patient complains only of the following symptoms, and all other reviewed systems are negative.  ALLERGIES: Allergies  Allergen Reactions  . Hydrocodone Other (See Comments)    Causes Hallucinations  . Penicillins Swelling    Has patient had a PCN reaction causing immediate rash, facial/tongue/throat swelling, SOB or lightheadedness with hypotension: YES Has patient had a PCN reaction causing severe rash involving mucus membranes or skin necrosis: NO Has patient had a PCN reaction that required hospitalization NO Has patient had a PCN reaction occurring within the last 10 years: NO If all of the above answers are "NO", then may proceed with Cephalosporin use.  . Sulfa Antibiotics Diarrhea, Itching and Rash    HOME MEDICATIONS: Outpatient Medications Prior to Visit  Medication Sig Dispense Refill  . albuterol (PROVENTIL,VENTOLIN) 90  MCG/ACT inhaler Inhale 2 puffs into the lungs every 4 (four) hours as needed for shortness of breath.     [name] aspirin 325 MG tablet Take 325 mg by mouth daily.    [name] atorvastatin (LIPITOR) 40 MG tablet Take 40 mg by mouth at bedtime.     . carvedilol (COREG CR) 10 MG 24 hr capsule Take 10 mg by mouth daily.    . furosemide (LASIX) 40 MG tablet Take 40 mg by mouth daily.    [name] gabapentin (NEURONTIN) 300 MG capsule Take 2 capsules (600 mg total) by mouth 3 (three) times daily. 180 capsule 2  . isosorbide dinitrate (ISORDIL) 20 MG tablet Take 20 mg by mouth 2 (two) times daily.     [name]  KLOR-CON M10 10 MEQ tablet Take 10 mEq by mouth 2 (two) times daily.     [name] KOMBIGLYZE XR 5-500 MG TB24 Take 1 tablet by mouth daily.     [name] lisinopril (PRINIVIL,ZESTRIL) 10 MG tablet Take 1 tablet by mouth daily.    [name] loratadine (CLARITIN) 10 MG tablet Take 10 mg by mouth daily.    . nitroGLYCERIN (NITROSTAT) 0.4 MG SL tablet Place 0.4 mg under the tongue every 5 (five) minutes as needed. For chest pain.    . pantoprazole (PROTONIX) 40 MG tablet Take 1 tablet (40 mg total) by mouth daily. 30 tablet 0  . sertraline (ZOLOFT) 100 MG tablet Take 100 mg by mouth daily.      No facility-administered medications prior to visit.     PAST MEDICAL HISTORY: Past Medical History:  Diagnosis Date  . Anginal pain ([place])    a. NL cath in 2008;  b. Myoview 03/2011: dec uptake along mid anterior wall on stress imaging -> ? attenuation vs. ischemia, EF 65%;  c. Echo 04/2011: EF 55-60%, no RWMA, Gr 2 dd  . Anxiety   . Asthma   . Bone cancer ([place])   . Cancer ([place])   . CHF (congestive heart failure) ([place])   . CHF (congestive heart failure) ([place])   . Depression   . Diabetes mellitus   . Hypertension   . 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 Obesity   . Pulmonary edema   . Pulmonary embolism ([place])    a. 2008 -> coumadin x 6 mos.  . Sleep apnea     PAST SURGICAL HISTORY: Past Surgical History:  Procedure  Laterality Date  . ABDOMINAL HYSTERECTOMY  2005  . CARDIAC CATHETERIZATION     Normal  . CARDIAC CATHETERIZATION N/A 09/30/2014   Procedure: Left Heart Cath and Coronary Angiography;  Surgeon: Sherren Mocha, MD;  Location: [facility];  Service: Cardiovascular;  Laterality: N/A;  . LAPAROSCOPIC APPENDECTOMY N/A 06/03/2012   Procedure: APPENDECTOMY LAPAROSCOPIC;  Surgeon: Stark Klein, MD;  Location: [facility];  Service: General;  Laterality: N/A;  . Left knee surgery  2008  . LEG SURGERY    . TUBAL LIGATION  1989    FAMILY HISTORY: Family History  Problem Relation Age of Onset  . Emphysema Mother   . Arthritis Mother   . Heart failure Mother     alive @ 8  . Asthma Brother   . Heart disease Father     died @ 74's.  . Stroke Father   . Diabetes Sister   . Heart disease Paternal Grandfather   . Colon cancer Maternal Grandfather     SOCIAL HISTORY: Social History   Social History  . Marital status: Legally Separated    Spouse name: N/A  . Number of children: N/A  . Years of education: N/A   Occupational History  . UNEMPLOYED Disabled   Social History Main Topics  . Smoking status: Former Smoker    Packs/day: 0.50    Years: 15.00    Types: Cigarettes    Quit date: 04/24/1985  . Smokeless tobacco: Never Used  . Alcohol use No  . Drug use: No  . Sexual activity: No   Other Topics Concern  . Not on file   Social History Narrative   Lives in [place] by herself.  Disabled.  Caffeine 1 cup avg daily.  3 kids, 6 grandkids.  PHYSICAL EXAM  Vitals:   12/29/15 1518  BP: 115/69  Pulse: 78  Weight: 219 lb (99.3 kg)  Height: 5' (1.524 m)   Body mass index is 42.77 kg/m.  Generalized: Well developed, in no acute distress   Neurological examination  Mentation: Alert oriented to time, place, history taking. Follows all commands speech and language fluent Cranial nerve II-XII: Pupils were equal round reactive to light. Extraocular movements were full, visual  field were full on confrontational test. Facial sensation and strength were normal. Uvula tongue midline. Head turning and shoulder shrug  were normal and symmetric. Motor: The motor testing reveals 5 over 5 strength of all 4 extremities. Good symmetric motor tone is noted throughout.  Sensory: Sensory testing is intact to soft touch on all 4 extremities. No evidence of extinction is noted.  Coordination: Cerebellar testing reveals good finger-nose-finger and heel-to-shin bilaterally.  Gait and station: Gait is normal. Tandem gait is normal. Romberg is negative. No drift is seen.  Reflexes: Deep tendon reflexes are symmetric and normal bilaterally.   DIAGNOSTIC DATA (LABS, IMAGING, TESTING) - I reviewed patient records, labs, notes, testing and imaging myself where available.  Lab Results  Component Value Date   WBC 7.0 12/14/2015   HGB 12.0 12/14/2015   HCT 38.5 12/14/2015   MCV 88.9 12/14/2015   PLT 223 12/14/2015      Component Value Date/Time   NA 139 12/14/2015 0405   K 3.6 12/14/2015 0405   CL 105 12/14/2015 0405   CO2 27 12/14/2015 0405   GLUCOSE 121 (H) 12/14/2015 0405   BUN 13 12/14/2015 0405   CREATININE 0.90 12/14/2015 0405   CALCIUM 9.7 12/14/2015 0405   PROT 6.7 02/26/2015 2103   PROT 6.7 06/18/2014 0930   PROT 6.7 06/18/2014 0930   ALBUMIN 3.6 02/26/2015 2103   AST 22 02/26/2015 2103   ALT 28 02/26/2015 2103   ALKPHOS 59 02/26/2015 2103   BILITOT 0.4 02/26/2015 2103   GFRNONAA >60 12/14/2015 0405   GFRAA >60 12/14/2015 0405     Lab Results  Component Value Date   [id number] 05/25/2014   Lab Results  Component Value Date   TSH 0.345 (L) 06/18/2014      ASSESSMENT AND PLAN 56 y.o. year old female  has a past medical history of Anginal pain ([place]); Anxiety; Asthma; Bone cancer ([place]); Cancer [facility]); CHF (congestive heart failure) ([place]); CHF (congestive heart failure) ([place]); Depression; Diabetes mellitus; Hypertension; Mediastinal mass; Obesity;  Pulmonary edema; Pulmonary embolism ([place]); and Sleep apnea. here with:  1. Obstructive sleep apnea on CPAP  The patient had excellent compliance. She is encouraged to continue using the CPAP nightly. I explained to the patient that switching to a dreamwear mask may not be of benefit if she is a mouth breather. For now she will continue to stay on the full face mask- she will follow-up in 6 months with Dr. Mechele Claude, MSN, NP-C 12/29/2015, 4:19 PM [facility] [address] [place], [place] [address] [phone]  I reviewed the above note and documentation by the Nurse Practitioner and agree with the history, physical exam, assessment and plan as outlined above. I was immediately available for face-to-face consultation. Star Age, MD, PhD [facility])

## 2015-12-29 NOTE — Patient Instructions (Signed)
Continue using cpap nightly Mask refitting today Order sent for new machine If your symptoms worsen or you develop new symptoms please let us know.

## 2015-12-30 IMAGING — CR DG CHEST 2V
2 series · 2 of 2 positions shown · non-contrast
Comparison: 12/01/2013

CLINICAL DATA: Chest pain.  Shortness of breath.

EXAM:
CHEST  2 VIEW

[w chest pa]
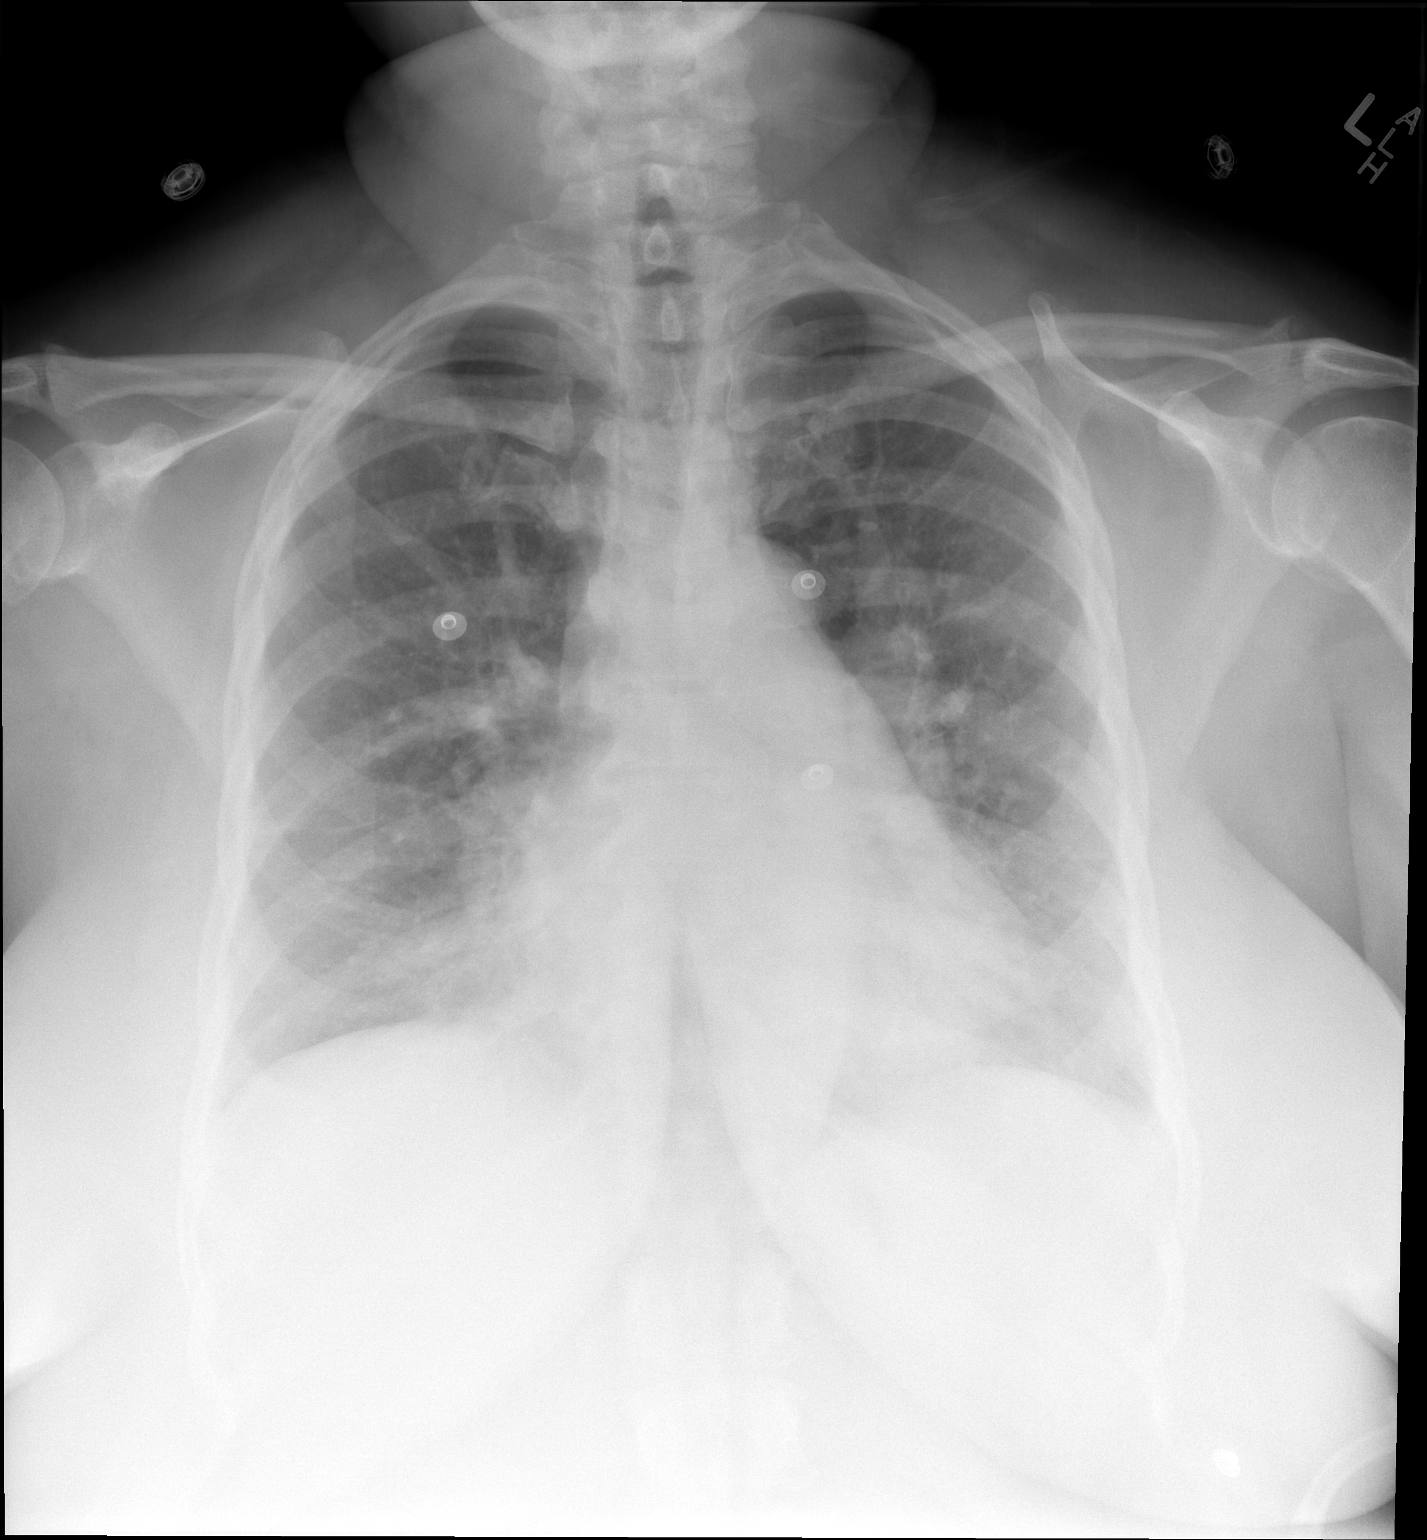

[w chest lat]
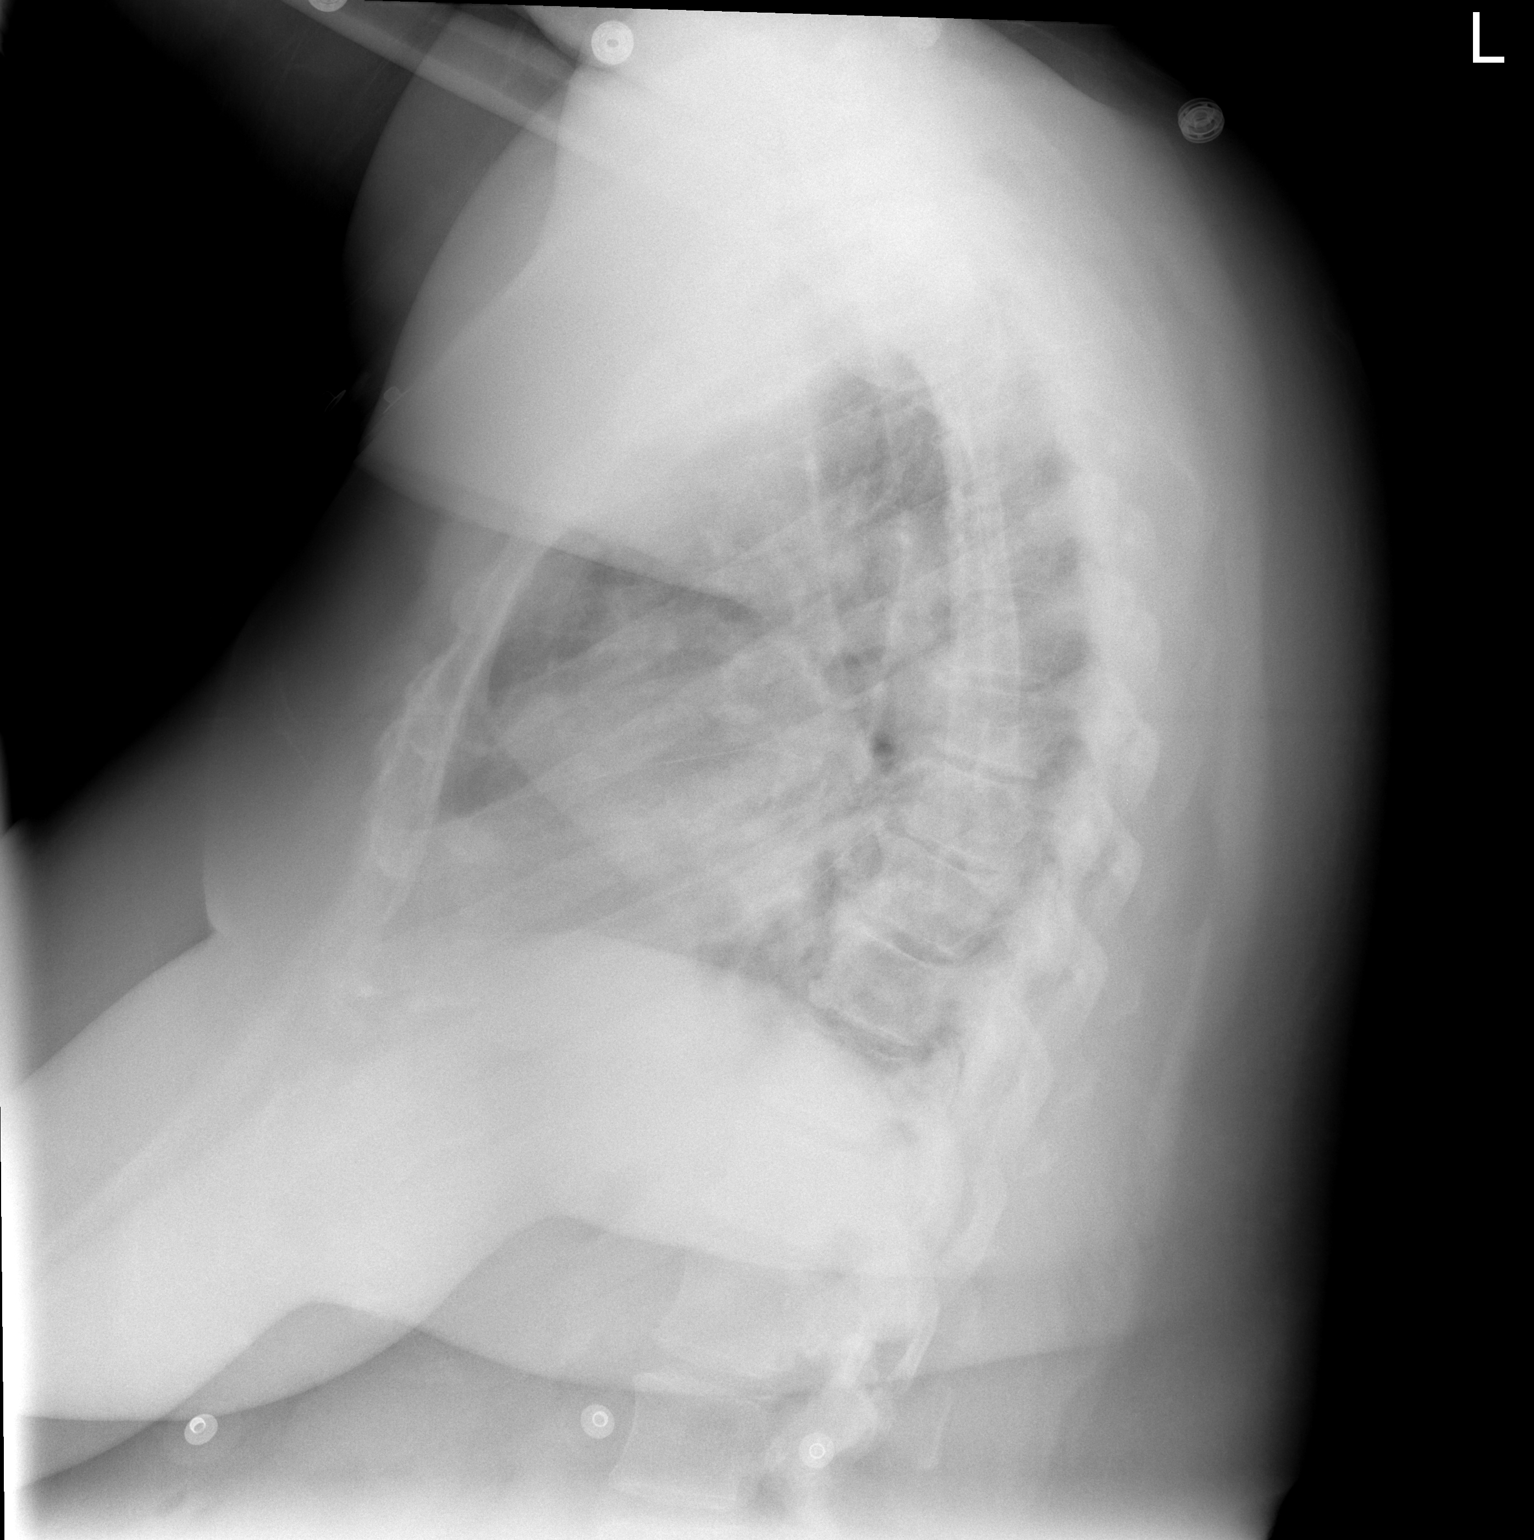

[2 of 2 positions shown; findings below may reference images not displayed]

FINDINGS: No cardiomegaly. Chronic small aortic tortuosity. Fullness of the
hila which is consistent with pulmonary artery enlargement on
preceding chest CT.

Symmetric indistinct densities at the medial bases which is likely
technical - appearance is similar to previous. No pulmonary edema,
effusion, or pneumothorax.
IMPRESSION: Stable exam.  No edema or definitive pneumonia.

## 2016-01-04 ENCOUNTER — Telehealth: Payer: Self-pay | Admitting: *Deleted

## 2016-01-04 NOTE — Telephone Encounter (Signed)
-----   Message from Patsy [place] sent at 12/30/2015  8:36 AM EDT ----- Thank you Katharine Look, order has been sent.  Angie ----- Message ----- From: Brandon Melnick, RN Sent: 12/29/2015   5:12 PM To: Patsy [place]  New cpap machine orders are in EPIC.  Thanks
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
## 2016-02-03 ENCOUNTER — Encounter: Payer: Self-pay | Admitting: Neurology

## 2016-02-08 ENCOUNTER — Telehealth: Payer: Self-pay | Admitting: Adult Health

## 2016-02-08 NOTE — Telephone Encounter (Signed)
The patient's CPAP download shows that the patient uses her machine nightly. She uses her machine greater than 4 hours 24/30 days for compliance of 80%. On average she uses her machine 5 hours and 57 minutes. Her residual AHI is 0.5 on a pressure of 16cm of water with EPR of 3. She does have a leak in the 95th percentile at 47.2 L/m. Please call the patient and ensure that she is changing out her equipment every 3 months. We may need to set up time for her to come in to have her mask refitted with Robin.

## 2016-02-09 NOTE — Telephone Encounter (Signed)
I spoke to pt.  I advised her that she is being compliant with her cpap but the concern is the leak she is having. She says that she does change her equipment every 3 months, and she just received new supplies about one month ago. I offered her a mask fit with Shirlean Mylar, but she declined at this time. She says that [facility] called her and told her that she needed an appt with [name] but it is unclear why. I advised her that I would reach out to [facility] regarding this and call her back.

## 2016-02-10 NOTE — Telephone Encounter (Signed)
I called pt again to discuss. No answer, left a message asking her to call me back. 

## 2016-02-10 NOTE — Telephone Encounter (Signed)
Received this notice from [facility]:  "Pt has [employer] insurance policy [facility] and Medicare) require a compliance follow up appt within 90 days of set up (doesn't matter if it is the initial set up or replacement machine).   Pt received her replacement pap machine on 01/03/16; therefore she would need a compliance follow up F2F office visit between 10/11-12/11/17 to send to [facility] for them to continue to pay the monthly rental for the replacement unit.   I certainly understand that insurance rules and guidelines can be crazy! This was the phone call the pt received as we were giving her this information so that she could schedule the follow up appt. "   I called pt to get her scheduled with either Jinny Blossom, NP or Dr. Brett Fairy during that time frame. No answer, left a message asking her to call me back.

## 2016-02-14 NOTE — Telephone Encounter (Signed)
I called pt again. No answer, left a message asking her to call me back. °

## 2016-02-15 NOTE — Telephone Encounter (Signed)
Pt returned Kristen's call °

## 2016-02-16 NOTE — Telephone Encounter (Signed)
Noted, thank you

## 2016-02-16 NOTE — Telephone Encounter (Signed)
I called [name] back. No answer, left a message asking her to call me back.  If [name] calls back, please advise her that [facility] and her insurance are requiring her to be seen again in the office for a cpap compliance visit. She needs to be seen either by Jinny Blossom, NP or Dr. Brett Fairy between 02/02/2016 and 04/03/2016. [name] needs to make that appt.

## 2016-02-16 NOTE — Telephone Encounter (Signed)
CPAP compliance appointment made with [facility] on 02-22-16 for the patient and she will bring her CPAP machine.

## 2016-02-20 ENCOUNTER — Encounter: Payer: Self-pay | Admitting: Neurology

## 2016-02-22 ENCOUNTER — Ambulatory Visit (INDEPENDENT_AMBULATORY_CARE_PROVIDER_SITE_OTHER): Payer: Medicare Other | Admitting: Adult Health

## 2016-02-22 ENCOUNTER — Encounter: Payer: Self-pay | Admitting: Adult Health

## 2016-02-22 VITALS — BP 143/73 | HR 80 | Resp 20 | Ht 60.0 in | Wt 227.0 lb

## 2016-02-22 DIAGNOSIS — G4733 Obstructive sleep apnea (adult) (pediatric): Secondary | ICD-10-CM | POA: Diagnosis not present

## 2016-02-22 DIAGNOSIS — Z9989 Dependence on other enabling machines and devices: Secondary | ICD-10-CM

## 2016-02-22 NOTE — Progress Notes (Signed)
PATIENT: [name] DOB: Jul 20, 1960  REASON FOR VISIT: follow up- obstructive sleep apnea HISTORY FROM: patient  HISTORY OF PRESENT ILLNESS: [name] is a 55 year old female with a history of obstructive sleep apnea on CPAP. She returns today for a compliance download to fulfill requirements by her DME/insurance company. Her download indicates that she uses her machine 30 out of 30 days for compliance of 100%. She uses her machine greater than 4 hours 23 out of 30 days for compliance of 77%. On average she uses her machine 5 hours and 47 minutes. Her residual AHI is 0.5 on 16 cm water with EPR 3. She does have a significant leak in the 95th percentile at 41.1 L/m. She states that she wears the nasal mask. She does notice that it has been leaking. She does try to change out her supplies regularly. She returns today for an evaluation.  Update 12/29/15 (MM): [name] is a 55 year old female with a history of obstructive sleep apnea on CPAP. She returns today for a compliance download. She is currently using her machine 30 out of 30 days for compliance of 100%. She primarily uses her machine each night greater than 4 hours. Her residual AHI is 0.8 cm water with EPR of 3. The patient does not have a significant leak. Patient's Epworth sleepiness score is 12 and fatigue severity score is 35. She is currently using a full face mask. She is asking about potentially trying the dream wear mask. She is unsure if she is a mouth breather. The patient also states that when she is using machine she cannot hear her phone ringing. She states that while she has the mask on and she is reading a book she hears the phone fine. Unsure of her ears or "stopped up." She returns today for an evaluation.  HISTORY 11/18/15 Per Dr. Edwena Felty Notes :Thatiana Okino Williamsonis a 54 y.o.female,seen here as a referral from Dr. Elby Beck a sleep medicine consultation,  [name], a right-handed  55 year old African-American female is seen here today upon referral by Dr. Ardeth Perfect. She has been on CPAP for many years, After being diagnosed in 2011 at [facility] long sleep lab, but had no medical follow-up regarding her OSA treatment for at least 3 years, according to her. The patient is established with a local pulmonologist and was diagnosed with obstructive sleep apnea as well as obesity hypoventilation syndrome.  She underwent arterial blood gas testing on 10-28-12 and PFTs pulmonary function tests on 08-02-12.  There is a history of congestive heart failure /diastolic failure, diabetes mellitus II, hypertension and hyperlipidemia.   The patient reports she feels extremely fatigued , and sleeps whenever not stimulated or physically active. First she thought it was due to depression , but she has a lot of physical factors contributing. She goes to bed at 10.30 PM and has trouble falling asleep, she sleeps through for 3 hours before her first bathroom break, usually has 2 nocturias. Her CPAP helps her to sleep sounder, it has not been adjusted. [facility] is her DME. She feels sometimes the CPAP is smothering her and often she took the mask off during sleep and didn't realize it until AM.  She rises at 7 AM to go ready for work. She has a very dry mouth in AM. She has breakfast at home before driving to work. Caffee in AM, 12 ounces. She works from 11 to 14.oo hours . At work, she does not have to fight sleepiness as  she is active and stimulated, but as soon as she leaves work and sits in her car she feels very drowsy and has an irresistible urge to sleep.  At home, she often takes inadvertently naps. She often falls asleep in front of the TV. She may get between 8 and 12 hours of sleep in total in a 24-hour period.  She was told during a hospital based test last month that her oxygen level is low.  REVIEW OF SYSTEMS: Out of a complete 14 system review of symptoms, the patient complains  only of the following symptoms, and all other reviewed systems are negative.  Incontinence of bladder, joint pain, joint swelling, unexpected weight change ALLERGIES: Allergies  Allergen Reactions  . Hydrocodone Other (See Comments)    Causes Hallucinations  . Penicillins Swelling      . Sulfa Antibiotics Diarrhea, Itching and Rash    HOME MEDICATIONS: Outpatient Medications Prior to Visit  Medication Sig Dispense Refill  . albuterol (PROVENTIL,VENTOLIN) 90 MCG/ACT inhaler Inhale 2 puffs into the lungs every 4 (four) hours as needed for shortness of breath.     [name] aspirin 325 MG tablet Take 325 mg by mouth daily.    [name] atorvastatin (LIPITOR) 40 MG tablet Take 40 mg by mouth at bedtime.     . carvedilol (COREG CR) 10 MG 24 hr capsule Take 10 mg by mouth daily.    . cyclobenzaprine (FLEXERIL) 5 MG tablet Take 5 mg by mouth 2 (two) times daily.     . furosemide (LASIX) 40 MG tablet Take 40 mg by mouth daily.    [name] gabapentin (NEURONTIN) 300 MG capsule Take 2 capsules (600 mg total) by mouth 3 (three) times daily. 180 capsule 2  . isosorbide dinitrate (ISORDIL) 20 MG tablet Take 20 mg by mouth 2 (two) times daily.     [name] KLOR-CON M10 10 MEQ tablet Take 10 mEq by mouth 2 (two) times daily.     [name] KOMBIGLYZE XR 5-500 MG TB24 Take 1 tablet by mouth daily.     [name] lisinopril (PRINIVIL,ZESTRIL) 10 MG tablet Take 1 tablet by mouth daily.    [name] loratadine (CLARITIN) 10 MG tablet Take 10 mg by mouth daily.    . nitroGLYCERIN (NITROSTAT) 0.4 MG SL tablet Place 0.4 mg under the tongue every 5 (five) minutes as needed. For chest pain.    . pantoprazole (PROTONIX) 40 MG tablet Take 1 tablet (40 mg total) by mouth daily. 30 tablet 0  . sertraline (ZOLOFT) 100 MG tablet Take 100 mg by mouth daily.      No facility-administered medications prior to visit.     PAST MEDICAL HISTORY: Past Medical History:  Diagnosis Date  . Anginal pain ([place])    a. NL cath in 2008;  b. Myoview 03/2011: dec uptake along mid  anterior wall on stress imaging -> ? attenuation vs. ischemia, EF 65%;  c. Echo 04/2011: EF 55-60%, no RWMA, Gr 2 dd  . Anxiety   . Asthma   . Bone cancer ([place])   . Cancer ([place])   . CHF (congestive heart failure) ([place])   . CHF (congestive heart failure) ([place])   . Depression   . Diabetes mellitus   . Hypertension   . 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 Obesity   . Pulmonary edema   . Pulmonary embolism ([place])    a. 2008 -> coumadin x 6 mos.  . Sleep apnea  PAST SURGICAL HISTORY: Past Surgical History:  Procedure Laterality Date  . ABDOMINAL HYSTERECTOMY  2005  . CARDIAC CATHETERIZATION     Normal  . CARDIAC CATHETERIZATION N/A 09/30/2014   Procedure: Left Heart Cath and Coronary Angiography;  Surgeon: Sherren Mocha, MD;  Location: [facility];  Service: Cardiovascular;  Laterality: N/A;  . LAPAROSCOPIC APPENDECTOMY N/A 06/03/2012   Procedure: APPENDECTOMY LAPAROSCOPIC;  Surgeon: Stark Klein, MD;  Location: [facility];  Service: General;  Laterality: N/A;  . Left knee surgery  2008  . LEG SURGERY    . TUBAL LIGATION  1989    FAMILY HISTORY: Family History  Problem Relation Age of Onset  . Emphysema Mother   . Arthritis Mother   . Heart failure Mother     alive @ 46  . Asthma Brother   . Heart disease Father     died @ 52's.  . Stroke Father   . Diabetes Sister   . Heart disease Paternal Grandfather   . Colon cancer Maternal Grandfather     SOCIAL HISTORY: Social History   Social History  . Marital status: Legally Separated    Spouse name: N/A  . Number of children: N/A  . Years of education: N/A   Occupational History  . UNEMPLOYED Disabled   Social History Main Topics  . Smoking status: Former Smoker    Packs/day: 0.50    Years: 15.00    Types: Cigarettes    Quit date: 04/24/1985  . Smokeless tobacco: Never Used  . Alcohol use No  . Drug use: No  . Sexual activity: No   Other Topics Concern  . Not on file   Social History  Narrative   Lives in [place] by herself.  Disabled.  Caffeine 1 cup avg daily.  3 kids, 6 grandkids.        PHYSICAL EXAM  Vitals:   02/22/16 1015  BP: (!) 143/73  Pulse: 80  Resp: 20  Weight: 227 lb (103 kg)  Height: 5' (1.524 m)   Body mass index is 44.33 kg/m.  Generalized: Well developed, in no acute distress  Neck: Circumference 17 inches, Mallampati 3+  Neurological examination  Mentation: Alert oriented to time, place, history taking. Follows all commands speech and language fluent Cranial nerve II-XII: Pupils were equal round reactive to light. Extraocular movements were full, visual field were full on confrontational test. Facial sensation and strength were normal. Uvula tongue midline. Head turning and shoulder shrug  were normal and symmetric. Motor: The motor testing reveals 5 over 5 strength of all 4 extremities. Good symmetric motor tone is noted throughout.  Sensory: Sensory testing is intact to soft touch on all 4 extremities. No evidence of extinction is noted.  Coordination: Cerebellar testing reveals good finger-nose-finger and heel-to-shin bilaterally.  Gait and station: Gait is normal.  Reflexes: Deep tendon reflexes are symmetric and normal bilaterally.   DIAGNOSTIC DATA (LABS, IMAGING, TESTING) - I reviewed patient records, labs, notes, testing and imaging myself where available.  Lab Results  Component Value Date   WBC 7.0 12/14/2015   HGB 12.0 12/14/2015   HCT 38.5 12/14/2015   MCV 88.9 12/14/2015   PLT 223 12/14/2015      Component Value Date/Time   NA 139 12/14/2015 0405   K 3.6 12/14/2015 0405   CL 105 12/14/2015 0405   CO2 27 12/14/2015 0405   GLUCOSE 121 (H) 12/14/2015 0405   BUN 13 12/14/2015 0405   CREATININE 0.90 12/14/2015 0405  CALCIUM 9.7 12/14/2015 0405   PROT 6.7 02/26/2015 2103   PROT 6.7 06/18/2014 0930   PROT 6.7 06/18/2014 0930   ALBUMIN 3.6 02/26/2015 2103   AST 22 02/26/2015 2103   ALT 28 02/26/2015 2103   ALKPHOS 59  02/26/2015 2103   BILITOT 0.4 02/26/2015 2103   GFRNONAA >60 12/14/2015 0405   GFRAA >60 12/14/2015 0405        ASSESSMENT AND PLAN 55 y.o. year old female  has a past medical history of Anginal pain ([place]); Anxiety; Asthma; Bone cancer ([place]); Cancer [facility]); CHF (congestive heart failure) ([place]); CHF (congestive heart failure) ([place]); Depression; Diabetes mellitus; Hypertension; Mediastinal mass; Obesity; Pulmonary edema; Pulmonary embolism ([place]); and Sleep apnea. here with:  1. OSA on CPAP  Patient is encouraged to continue using CPAP nightly. She should use her machine greater than 4 hours each night. She is encouraged also to use her machine when she naps during the day. She will stop by the sleep lab today to have her mask refitted due to high leak. Patient advised that if her symptoms worsen or she develops any new symptoms she should let us know. She will keep her appointment in July with Dr. Mechele Claude, MSN, NP-C 02/22/2016, 10:13 AM [facility] [address], [place], [place] [address] [phone]

## 2016-02-22 NOTE — Progress Notes (Signed)
I agree with the assessment and plan as directed by NP .The patient is known to me .   Annell Canty, MD  

## 2016-02-22 NOTE — Patient Instructions (Signed)
Continue using CPAP nightly  Try to use >4 hours each night Use during naps as well Mask refitted If your symptoms worsen or you develop new symptoms please let us know.

## 2016-02-25 ENCOUNTER — Encounter (INDEPENDENT_AMBULATORY_CARE_PROVIDER_SITE_OTHER): Payer: Self-pay | Admitting: Orthopedic Surgery

## 2016-02-25 ENCOUNTER — Ambulatory Visit (INDEPENDENT_AMBULATORY_CARE_PROVIDER_SITE_OTHER): Payer: Medicare Other | Admitting: Orthopedic Surgery

## 2016-02-25 VITALS — BP 175/103 | HR 72 | Ht 60.0 in | Wt 222.0 lb

## 2016-02-25 DIAGNOSIS — M1712 Unilateral primary osteoarthritis, left knee: Secondary | ICD-10-CM

## 2016-02-25 DIAGNOSIS — M65311 Trigger thumb, right thumb: Secondary | ICD-10-CM

## 2016-02-25 MED ORDER — BUPIVACAINE HCL 0.25 % IJ SOLN
4.0000 mL | INTRAMUSCULAR | Status: AC | PRN
  Administered 2016-02-25: 4 mL via INTRA_ARTICULAR

## 2016-02-25 MED ORDER — METHYLPREDNISOLONE ACETATE 40 MG/ML IJ SUSP
40.0000 mg | INTRAMUSCULAR | Status: AC | PRN
  Administered 2016-02-25: 40 mg via INTRA_ARTICULAR

## 2016-02-25 MED ORDER — LIDOCAINE HCL 1 % IJ SOLN
1.0000 mL | INTRAMUSCULAR | Status: AC | PRN
Start: 2016-02-25 — End: 2016-02-25
  Administered 2016-02-25: 1 mL

## 2016-02-25 MED ORDER — LIDOCAINE HCL 1 % IJ SOLN
5.0000 mL | INTRAMUSCULAR | Status: AC | PRN
  Administered 2016-02-25: 5 mL

## 2016-02-25 MED ORDER — BUPIVACAINE HCL 0.25 % IJ SOLN
0.3300 mL | INTRAMUSCULAR | Status: AC | PRN
  Administered 2016-02-25: .33 mL

## 2016-02-25 MED ORDER — METHYLPREDNISOLONE ACETATE 40 MG/ML IJ SUSP
13.3300 mg | INTRAMUSCULAR | Status: AC | PRN
  Administered 2016-02-25: 13.33 mg

## 2016-02-25 NOTE — Progress Notes (Signed)
Office Visit Note   Patient: [name]           Date of Birth: 19-Feb-1961           MRN: [id number] Visit Date: 02/25/2016 Requested by: Velna Hatchet, MD [place], [place] [id number] PCP: Velna Hatchet, MD  Subjective: Chief Complaint  Patient presents with  . Right Hand - Pain  . Left Knee - Pain    HPI [name] is a 55 year old right-hand-dominant female with right hand pain and left knee pain.  She reports that the thumb started triggering about 2 months ago.  Reports stiffness in the fingers and digits as well.  She thinks she has rheumatoid arthritis with there is a family history.  She also shows left knee pain and she has known history of arthritis in the knee.  States that the hand and thumb particularly hurt her daily and that the thumb constantly triggers and become stuck.  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  She had Synvisc recently in the left knee in May.  She's been taking over-the-counter medications without much relief for either issue              Review of Systems all systems reviewed and negative is a relate to the chief complaints and there are no fevers or chills   Assessment & Plan: Visit Diagnoses:  1. Trigger thumb, right thumb   2. Primary osteoarthritis of left knee     Plan: Impression is right trigger thumb 2 months duration but with significant triggering on a constant daily basis.  I told her that ultrasound-guided injection may offer the chance of some relief but with that type of constant triggering of it is likely she will go to surgery for this problem.  Nonetheless she does want to try an injection today which is done under ultrasound guidance.  We did get it right between the tendon and the A1 pulley.  In regards to the left knee that's another problem entirely.  This is a significant problem which can be addressed in the future with knee replacement when she is ready for it.  For now would aspirate and inject the  knee today.  She tolerated both injections well and will see her back as needed  Follow-Up Instructions: Return if symptoms worsen or fail to improve.   Orders:  Orders Placed This Encounter  Procedures  . Large Joint Injection/Arthrocentesis  . Hand/Upper Extremity Injection/Arthrocentesis   No orders of the defined types were placed in this encounter.     Procedures: Large Joint Inj Date/Time: 02/25/2016 4:18 PM Performed by: Meredith Pel Authorized by: Meredith Pel   Consent Given by:  Patient Site marked: the procedure site was marked   Timeout: prior to procedure the correct patient, procedure, and site was verified   Indications:  Pain, joint swelling and diagnostic evaluation Location:  Knee Site:  L knee Prep: patient was prepped and draped in usual sterile fashion   Needle Size:  18 G Needle Length:  1.5 inches Approach:  Superolateral Ultrasound Guidance: No   Fluoroscopic Guidance: No   Arthrogram: No Medications:  5 mL lidocaine 1 %; 4 mL bupivacaine 0.25 %; 40 mg methylPREDNISolone acetate 40 MG/ML Aspiration Attempted: Yes   Aspirate amount (mL):  10 Aspirate:  Serous Patient tolerance:  Patient tolerated the procedure well with no immediate complications Hand/UE Inj Date/Time: 02/25/2016 4:19 PM Performed by: Meredith Pel Authorized by: Alphonzo Severance  Janequa Kipnis   Consent Given by:  Patient Site marked: the procedure site was marked   Timeout: prior to procedure the correct patient, procedure, and site was verified   Indications:  Therapeutic Condition: trigger finger   Location:  Thumb Site:  R thumb A1 Prep: patient was prepped and draped in usual sterile fashion   Needle Size:  25 G Approach:  Volar Ultrasound Guidance: Yes   Medications:  1 mL lidocaine 1 %; 0.33 mL bupivacaine 0.25 %; 13.33 mg methylPREDNISolone acetate 40 MG/ML Patient tolerance:  Patient tolerated the procedure well with no immediate  complications      Clinical Data: No additional findings.  Objective: Vital Signs: BP (!) 175/103   Pulse 72   Ht 5' (1.524 m)   Wt 222 lb (100.7 kg)   BMI 43.36 kg/m   Physical Exam  Constitutional: She appears well-developed.  HENT:  Head: Normocephalic.  Eyes: EOM are normal.  Neck: Normal range of motion.  Cardiovascular: Normal rate.   Pulmonary/Chest: Effort normal.  Neurological: She is alert.  Skin: Skin is warm.  Psychiatric: She has a normal mood and affect.    Ortho Exam examination of both hands demonstrates fairly significant Oster arthritis affecting the PIP and DIP joints.  She doesn't really have any ulnar drift of the MCP joints.  She does have restricted range of motion of both hands in terms of composite flexion of the fingers.  On the right thumb she does have tenderness over the A1 pulley and essentially triggering every time I flexion exceeds about 40.  Left thumb does not trigger.  Wrist range of motion is full and's intact with palpable radial pulses bilaterally.  Left knee demonstrates trace effusion intact since mechanism medial lateral joint line tenderness patella femoral crepitus is present, push limbs are stable pedal pulses palpable since mechanism is intact no groin pain interlocks rotation of the leg medial lateral joint line tenderness is present  Specialty Comments:  No specialty comments available.  Imaging: No results found.   PMFS History: Patient Active Problem List   Diagnosis Date Noted  . Hypersomnia with sleep apnea 11/18/2015  . Lethargy 11/18/2015  . Chest pain 09/30/2014  . Abnormal cardiac function test 09/29/2014  . Nausea with vomiting   . Chest pain with moderate risk for cardiac etiology 09/28/2014  . HTN (hypertension) 09/28/2014  . Hypokalemia   . Paresthesia 06/18/2014  . Numbness and tingling of left arm and leg   . Unstable angina ([facility]) 05/24/2014  . OSA on CPAP 05/24/2014  . DM2 (diabetes mellitus, type  2) ([facility]) 10/29/2012  . S/P laparoscopic appendectomy 06/04/2012  . Acute appendicitis with generalized peritonitis 06/04/2012  . Diastolic CHF ([facility]) [id number]  . Mediastinal mass 01/09/2012  . Hypotension 01/08/2012  . DOE (dyspnea on exertion) 06/20/2011  . Mediastinal abnormality 06/20/2011  . Dyslipidemia 12/23/2009  . GLAUCOMA 12/23/2009  . ARTHRITIS 12/23/2009  . 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 Morbid obesity-BMI 45 09/13/2006  . ANXIETY STATE NOS 09/13/2006  . DISORDER, DEPRESSIVE NEC 09/13/2006  . CARPAL TUNNEL SYNDROME, MILD 09/13/2006  . Unspecified essential hypertension 09/13/2006  . IBS 09/13/2006  . DEGENERATION, LUMBAR/LUMBOSACRAL DISC 09/13/2006  . SYMPTOM, SWELLING/MASS/LUMP IN CHEST 09/13/2006  . PULMONARY EMBOLISM, HX OF 09/13/2006   Past Medical History:  Diagnosis Date  . Anginal pain ([facility])    a. NL cath in 2008;  b. Myoview 03/2011: dec uptake along mid anterior wall on stress imaging -> ? attenuation vs. ischemia, EF 65%;  c. Echo 04/2011: EF 55-60%, no RWMA, Gr 2 dd  . Anxiety   . Asthma   . Bone cancer ([facility])   . Cancer ([facility])   . CHF (congestive heart failure) ([facility])   . CHF (congestive heart failure) ([facility])   . Depression   . Diabetes mellitus   . Hypertension   . 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 Obesity   . Pulmonary edema   . Pulmonary embolism ([facility])    a. 2008 -> coumadin x 6 mos.  . Sleep apnea     Family History  Problem Relation Age of Onset  . Emphysema Mother   . Arthritis Mother   . Heart failure Mother     alive @ 76  . Asthma Brother   . Heart disease Father     died @ 3's.  . Stroke Father   . Diabetes Sister   . Heart disease Paternal Grandfather   . Colon cancer Maternal Grandfather     Past Surgical History:  Procedure Laterality Date  . ABDOMINAL HYSTERECTOMY  2005  . CARDIAC CATHETERIZATION     Normal  . CARDIAC CATHETERIZATION N/A 09/30/2014   Procedure: Left Heart Cath and Coronary Angiography;   Surgeon: Sherren Mocha, MD;  Location: [facility];  Service: Cardiovascular;  Laterality: N/A;  . LAPAROSCOPIC APPENDECTOMY N/A 06/03/2012   Procedure: APPENDECTOMY LAPAROSCOPIC;  Surgeon: Stark Klein, MD;  Location: [facility];  Service: General;  Laterality: N/A;  . Left knee surgery  2008  . LEG SURGERY    . TUBAL LIGATION  1989   Social History   Occupational History  . UNEMPLOYED Disabled   Social History Main Topics  . Smoking status: Former Smoker    Packs/day: 0.50    Years: 15.00    Types: Cigarettes    Quit date: 04/24/1985  . Smokeless tobacco: Never Used  . Alcohol use No  . Drug use: No  . Sexual activity: No
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
## 2016-03-13 ENCOUNTER — Other Ambulatory Visit: Payer: Self-pay | Admitting: Internal Medicine

## 2016-03-13 DIAGNOSIS — Z1231 Encounter for screening mammogram for malignant neoplasm of breast: Secondary | ICD-10-CM

## 2016-03-20 NOTE — Telephone Encounter (Signed)
No additional notes found  

## 2016-03-28 IMAGING — MR MR HEAD W/O CM
9 of 11 series · 34 of 48 positions shown · non-contrast
Comparison: CT of the head May 24, 2014 at 935 hr, MRI of the
pituitary March 16, 2014

CLINICAL DATA: Acute onset chest pain beginning last night, LEFT
chest pain radiating to the shoulder. Vomiting. Numbness and
tingling in LEFT arm. History of hypertension, diabetes, CHF,
cancer. History of Rathke's cleft cyst.

EXAM:
MRI HEAD WITHOUT CONTRAST
TECHNIQUE: Multiplanar, multiecho pulse sequences of the brain and surrounding
structures were obtained without intravenous contrast.

[Series 2: FLAIR · sagittal · 5.0mm · 0.47mm/px · 2 of 22 slices shown (1 of 2)]
[im 1/22]
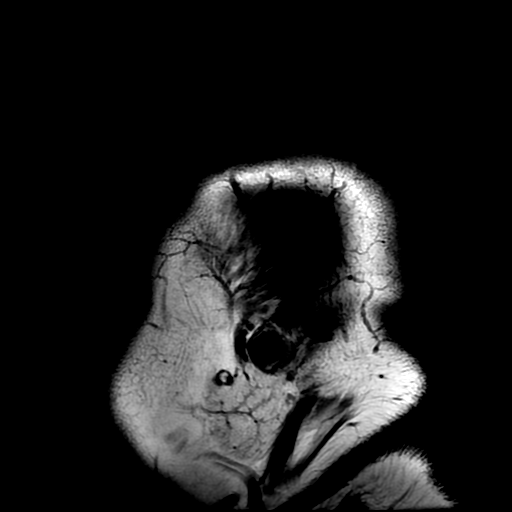
[im 22/22]
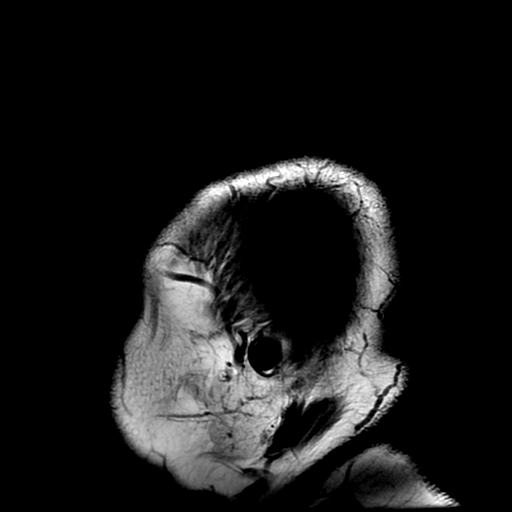

[Series 4: DWI · axial · 3.0mm · 0.94mm/px · z∈[-68,+78]mm · 9 of 100 slices shown (1 of 4)]
[im 1/100]
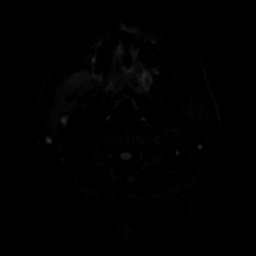
[im 13/100]
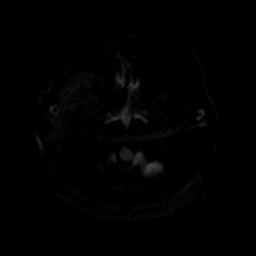
[im 25/100]
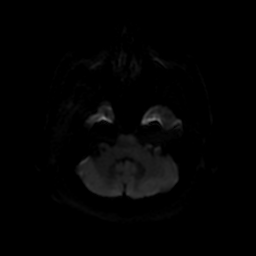
[im 38/100]
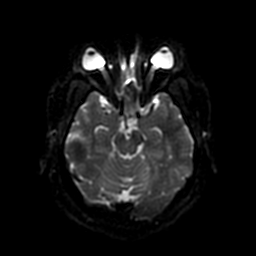
[im 50/100]
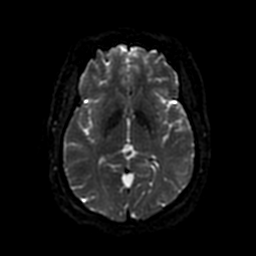
[im 62/100]
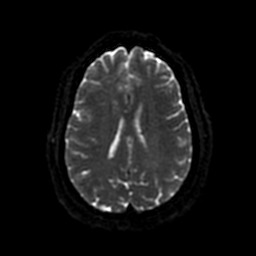
[im 75/100]
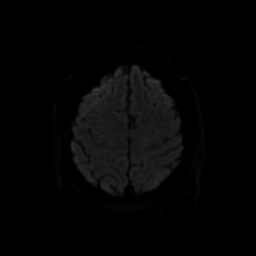
[im 87/100]
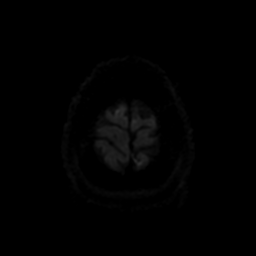
[im 100/100]
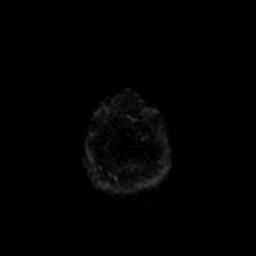

[Series 5: DWI · coronal · 5.0mm · 0.94mm/px · 6 of 66 slices shown (2 of 4)]
[im 1/66]
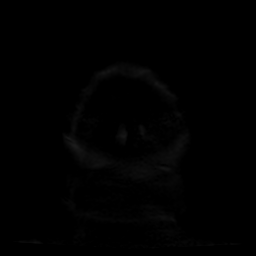
[im 14/66]
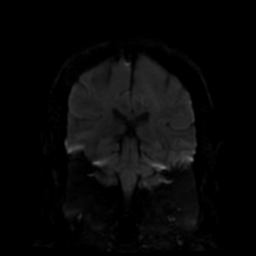
[im 27/66]
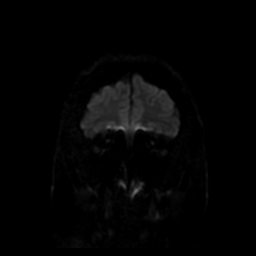
[im 40/66]
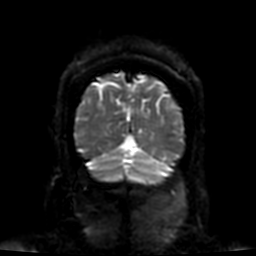
[im 53/66]
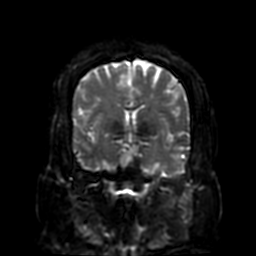
[im 66/66]
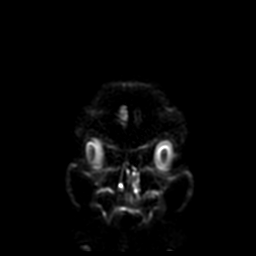

[Series 6: T2 · axial · 5.0mm · 0.47mm/px · z∈[-67,+77]mm · 2 of 25 slices shown (1 of 2)]
[im 1/25]
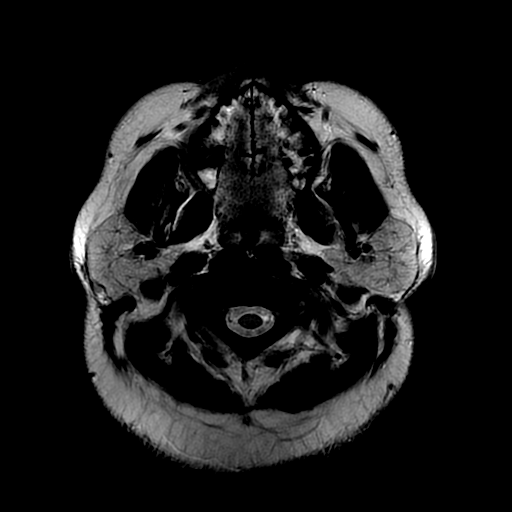
[im 25/25]
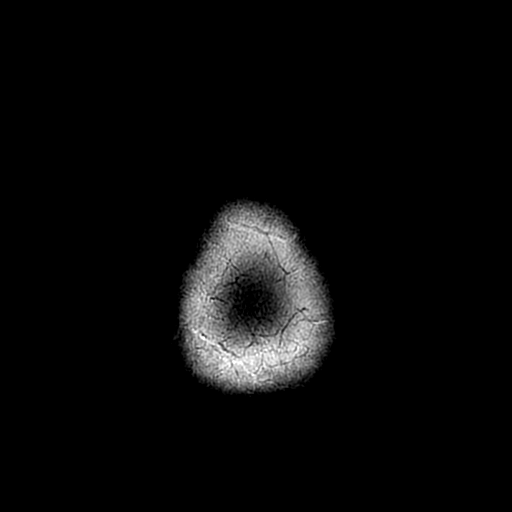

[Series 7: FLAIR · axial · 5.0mm · 0.47mm/px · z∈[-67,+77]mm · 2 of 25 slices shown (2 of 2)]
[im 1/25]
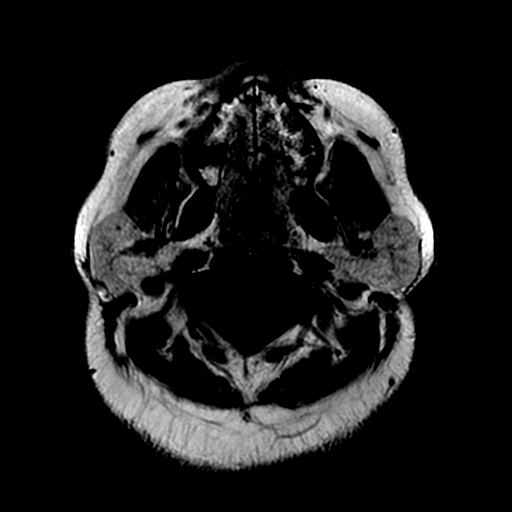
[im 25/25]
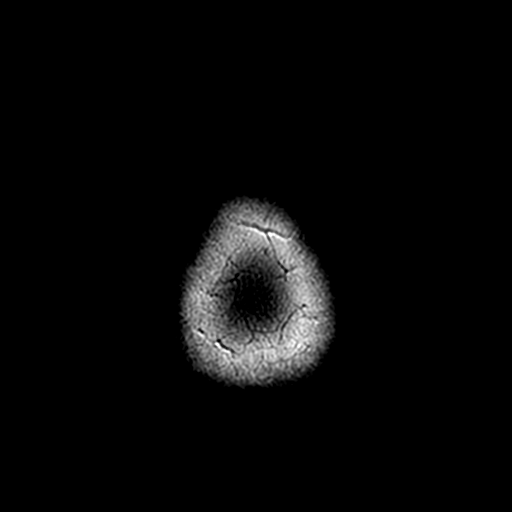

[Series 8: (person_name) · axial · 3.0mm · 0.47mm/px · z∈[-68,-13]mm · 4 of 100 slices shown]
[im 1/100]
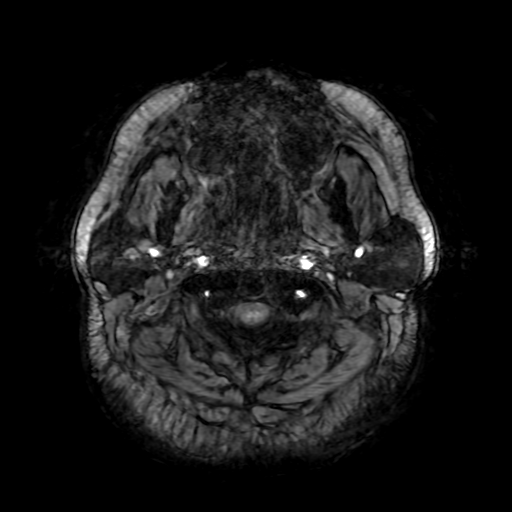
[im 13/100]
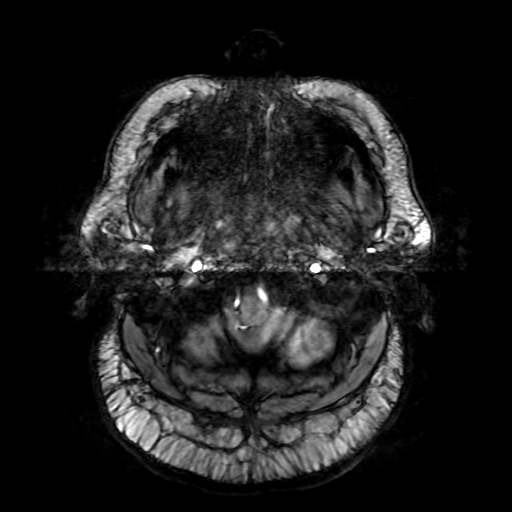
[im 25/100]
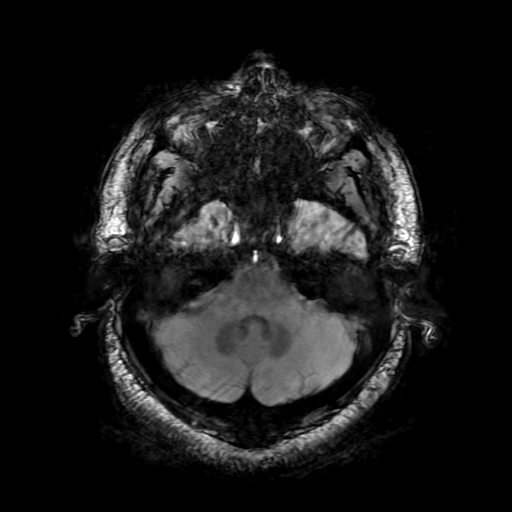
[im 38/100]
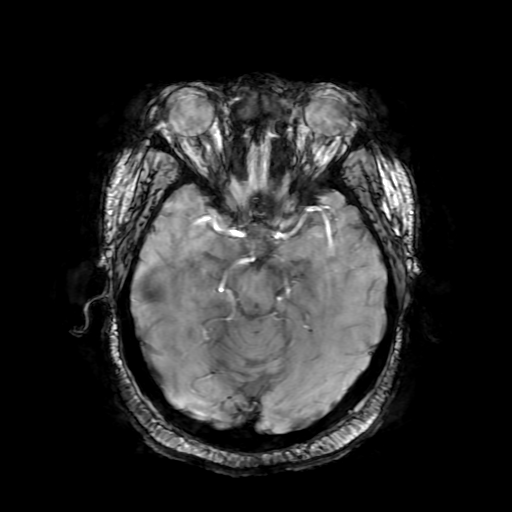

[Series 11: T2 · coronal · 5.0mm · 0.47mm/px · 2 of 28 slices shown (2 of 2)]
[im 1/28]
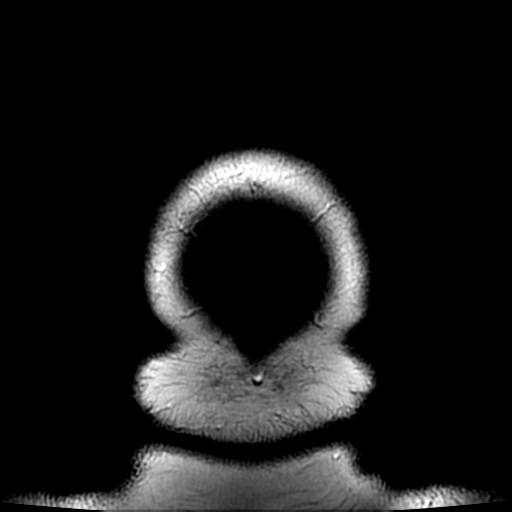
[im 28/28]
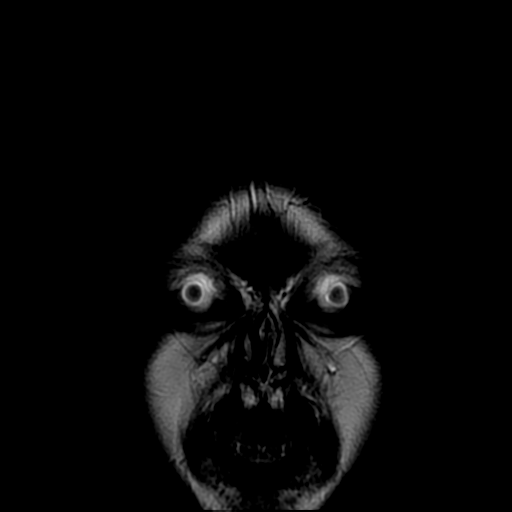

[Series 400: DWI · axial · 3.0mm · 0.94mm/px · z∈[-68,+78]mm · 4 of 48 slices shown (3 of 4)]
[im 1/48]
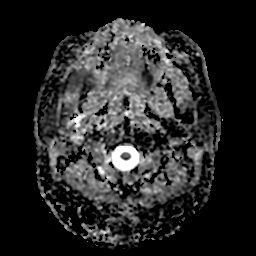
[im 16/48]
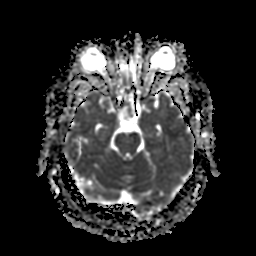
[im 32/48]
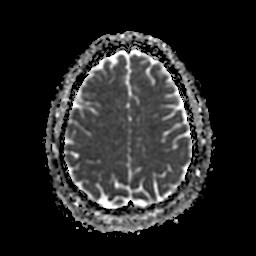
[im 48/48]
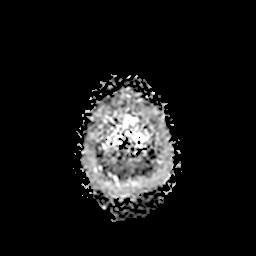

[Series 500: DWI · coronal · 5.0mm · 0.94mm/px · 3 of 33 slices shown (4 of 4)]
[im 1/33]
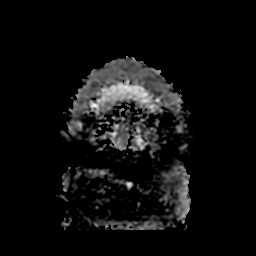
[im 17/33]
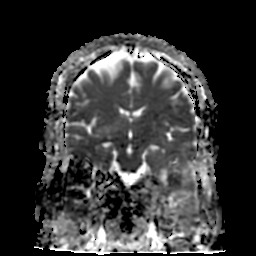
[im 33/33]
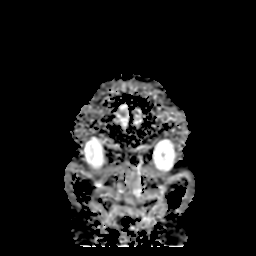

[34 of 48 positions shown; findings below may reference images not displayed]

FINDINGS: No reduced diffusion to suggest acute ischemia. No susceptibility
artifact to suggest hemorrhage.

Ventricles and sulci are normal for age. Scattered subcentimeter
supratentorial white matter FLAIR T2 hyperintensities, unchanged
though, better seen on today's 3 tesla examination. No midline
shift, mass effect.

No abnormal extra-axial fluid collections. Normal major intracranial
vascular flow voids are seen. RIGHT sella/suprasellar cystic lesion
corresponding to known Rathke's cleft cyst. No paranasal sinus
air-fluid levels. Mastoid air cells are well aerated. Ocular globes
and orbital contents are unremarkable though not tailored for
evaluation. No cerebellar tonsillar ectopia.
IMPRESSION: No acute intracranial process, specifically no acute ischemia.

Mild white matter changes are nonspecific, can be seen with chronic
small vessel ischemic disease, stable.

Cystic sellar/ suprasellar lesion corresponding to known Rathke's
cleft cyst.

  By: Frederic Rodriguez Hernandez

## 2016-03-28 IMAGING — CT CT HEAD W/O CM
2 series · 16 of 30 positions shown, 20 images · non-contrast
Comparison: CT scan of June 25, 2013.

CLINICAL DATA: Left arm numbness.

EXAM:
CT HEAD WITHOUT CONTRAST
TECHNIQUE: Contiguous axial images were obtained from the base of the skull
through the vertex without intravenous contrast.

[Series 201: head w/o, idose (1) · axial · non-contrast · 0.49mm/px · z∈[+50,+180]mm · 13 of 32 slices shown, 17 images]
[im 3/32  brain]
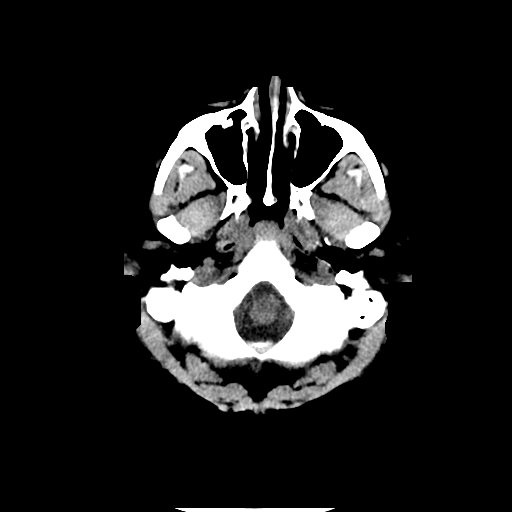
[im 3/32  bone]
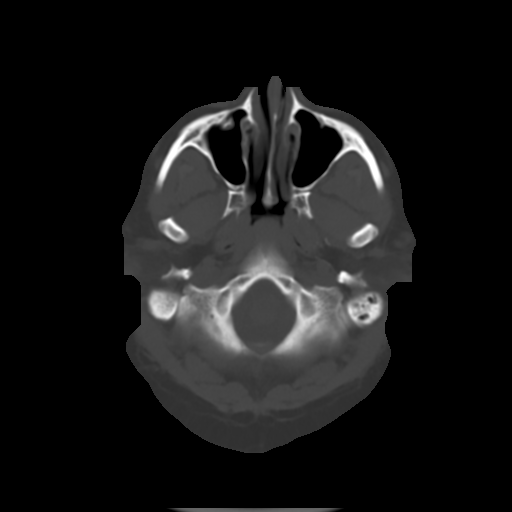
[im 5/32  brain]
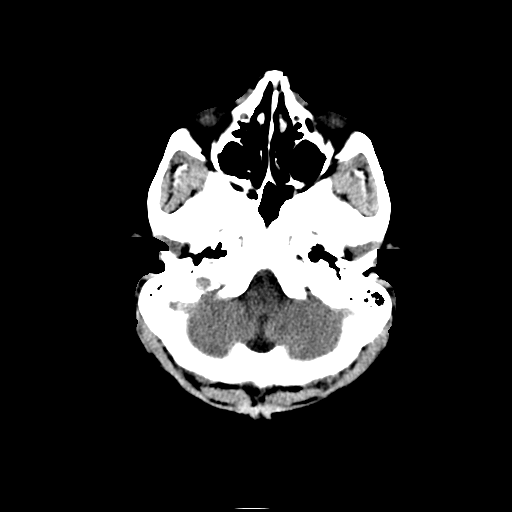
[im 7/32  brain]
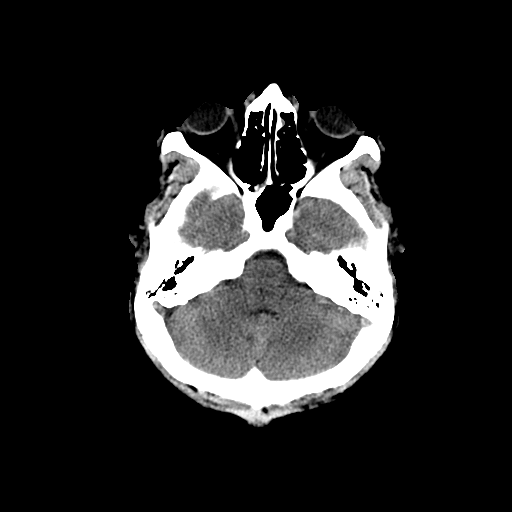
[im 9/32  brain]
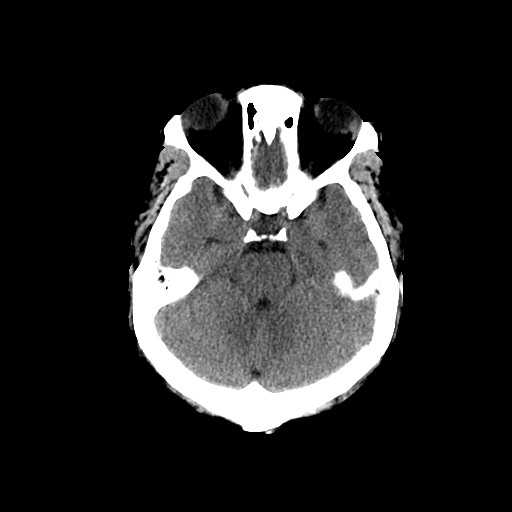
[im 12/32  brain]
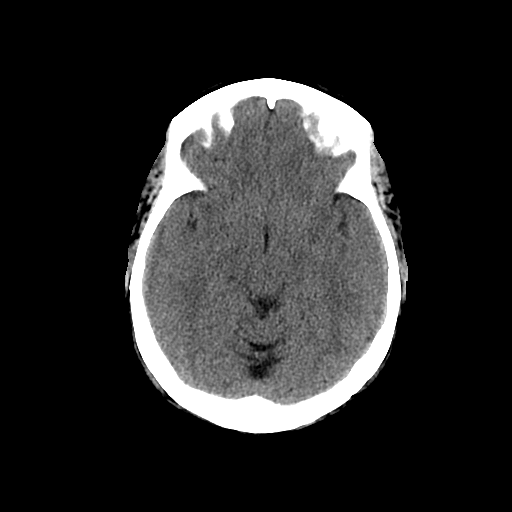
[im 12/32  bone]
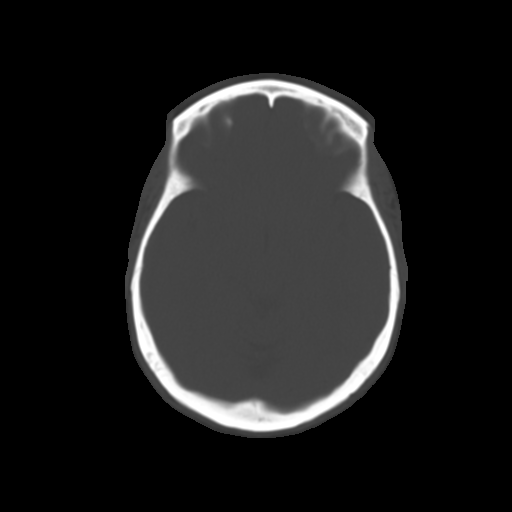
[im 14/32  brain]
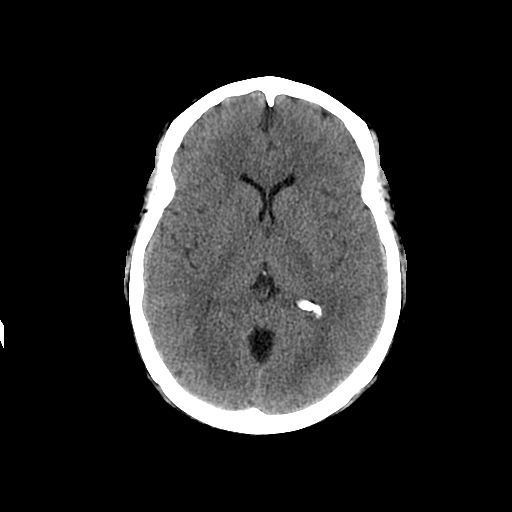
[im 16/32  brain]
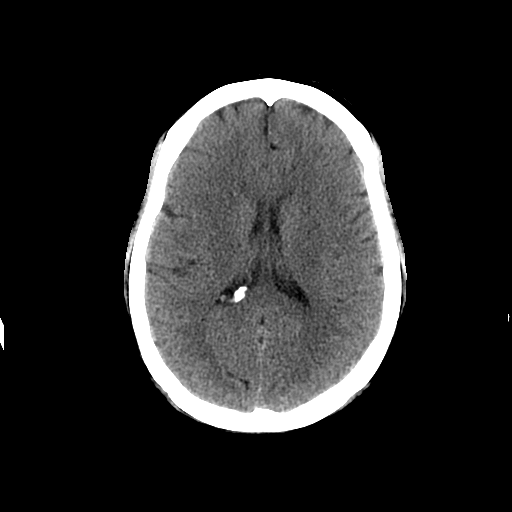
[im 18/32  brain]
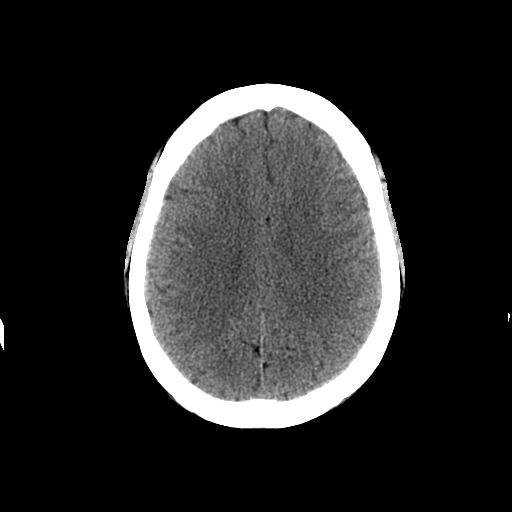
[im 20/32  brain]
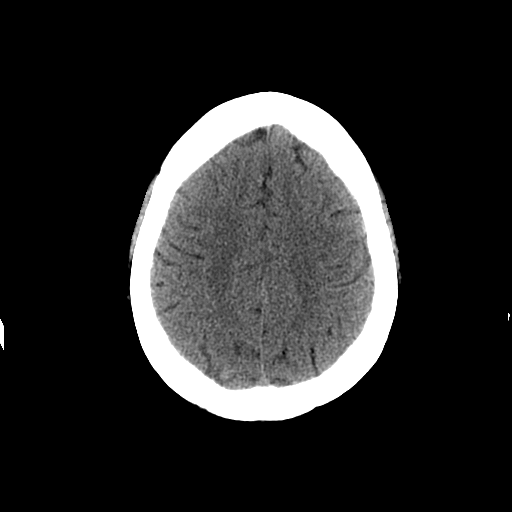
[im 20/32  bone]
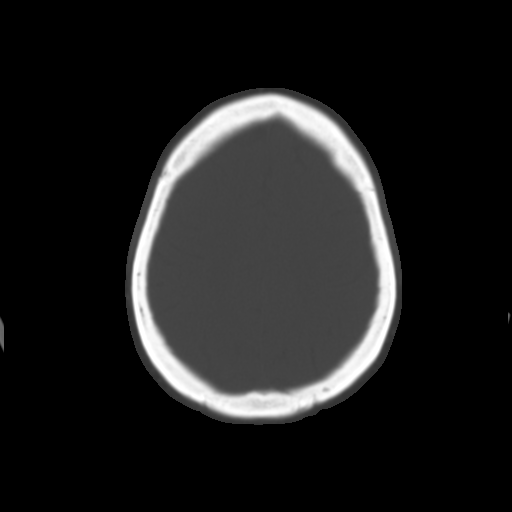
[im 23/32  brain]
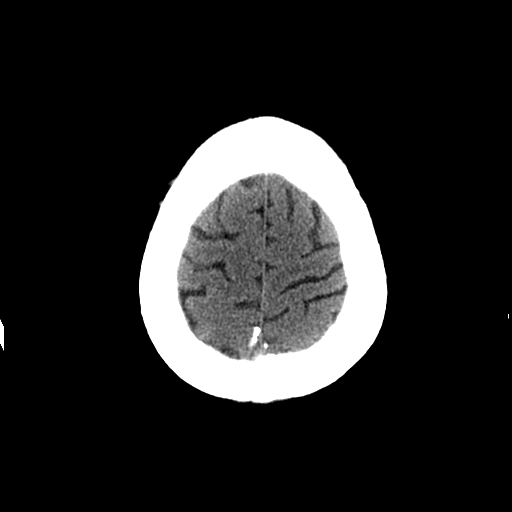
[im 25/32  brain]
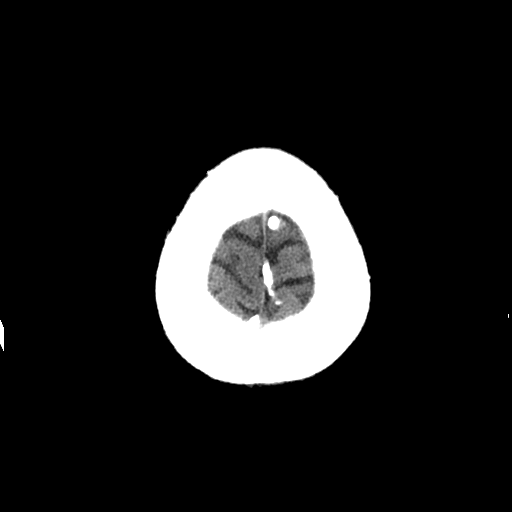
[im 27/32  brain]
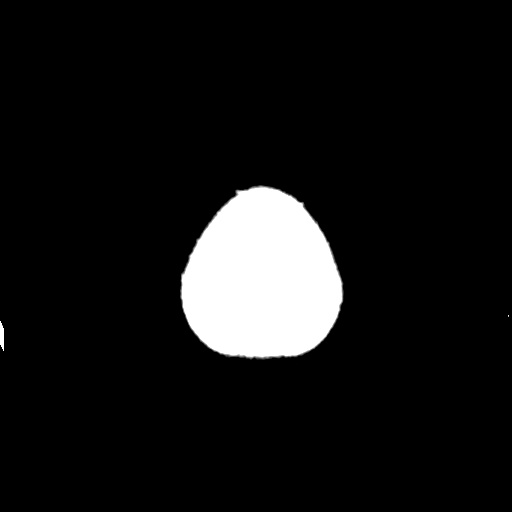
[im 29/32  brain]
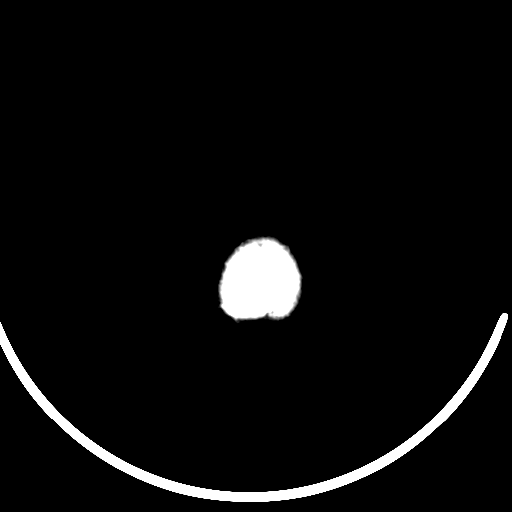
[im 29/32  bone]
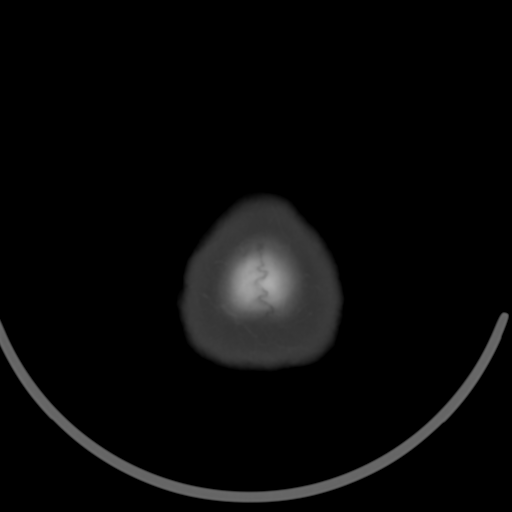

[Series 202: head w/o bone, idose (1) · axial · non-contrast · 0.49mm/px · z∈[+50,+95]mm · 3 of 32 slices shown]
[im 3/32  bone]
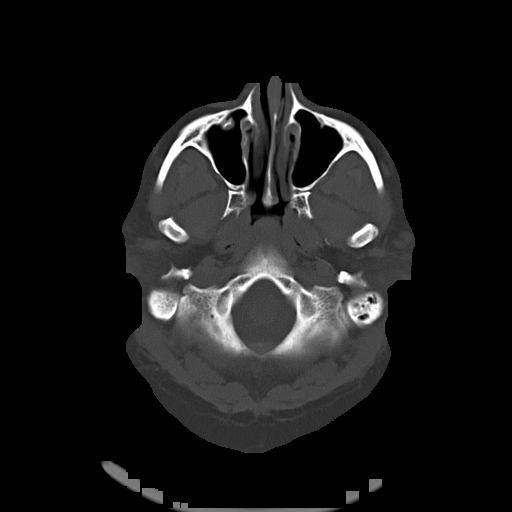
[im 7/32  bone]
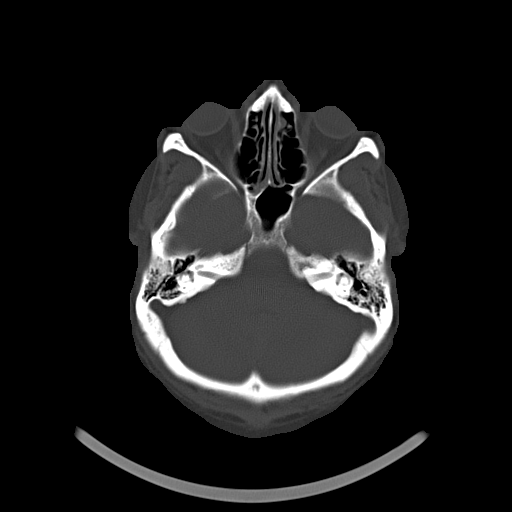
[im 12/32  bone]
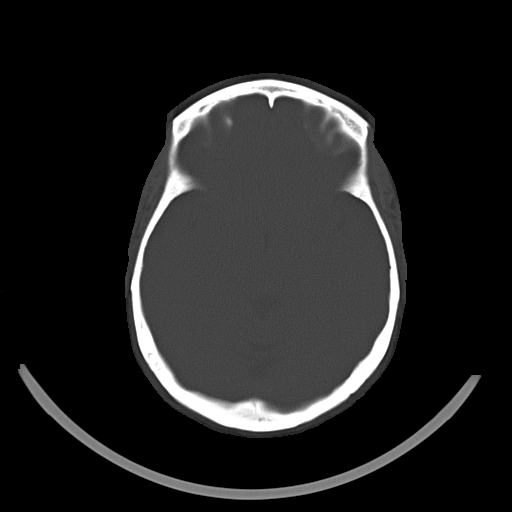

[16 of 30 positions shown; findings below may reference images not displayed]

FINDINGS: Bony calvarium appears intact. No mass effect or midline shift is
noted. Ventricular size is within normal limits. There is no
evidence of mass lesion, hemorrhage or acute infarction.
IMPRESSION: Normal head CT.

## 2016-03-28 IMAGING — CT CT ANGIO CHEST
2 of 8 series · 18 of 46 positions shown · IV contrast (Omni 300)
Comparison: CT chest 02/24/2014.  PA and lateral chest 04/27/2014.

CLINICAL DATA: Chest pain radiating into the left arm.

EXAM:
CT ANGIOGRAPHY CHEST WITH CONTRAST
TECHNIQUE: Multidetector CT imaging of the chest was performed using the
standard protocol during bolus administration of intravenous
contrast. Multiplanar CT image reconstructions and MIPs were
obtained to evaluate the vascular anatomy.
CONTRAST:  100 mL OMNIPAQUE IOHEXOL 350 MG/ML SOLN

[Series 5: thins · axial · 0.60mm/px · z∈[+1328,+1539]mm · 15 of 233 slices shown]
[im 11/233  lung]
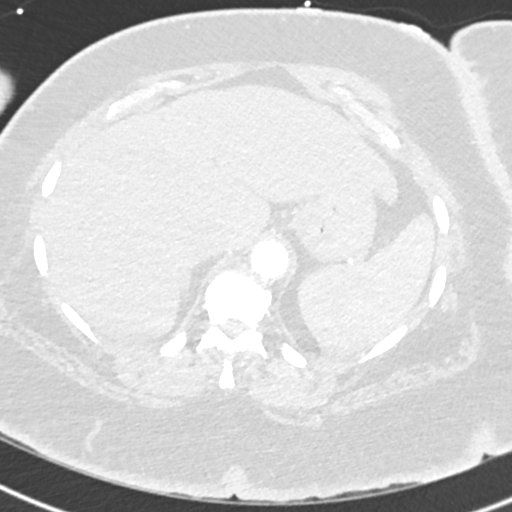
[im 32/233  soft-tissue]
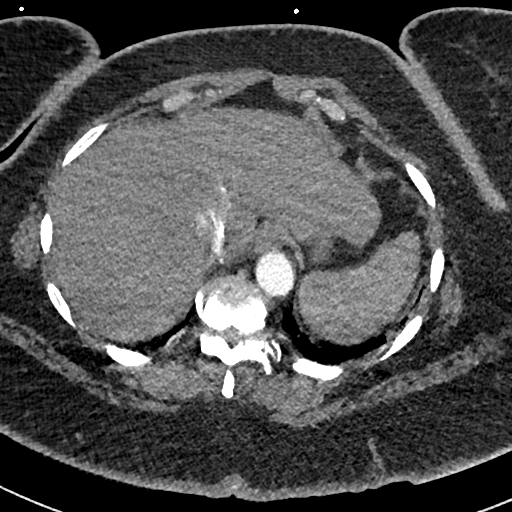
[im 43/233  lung]
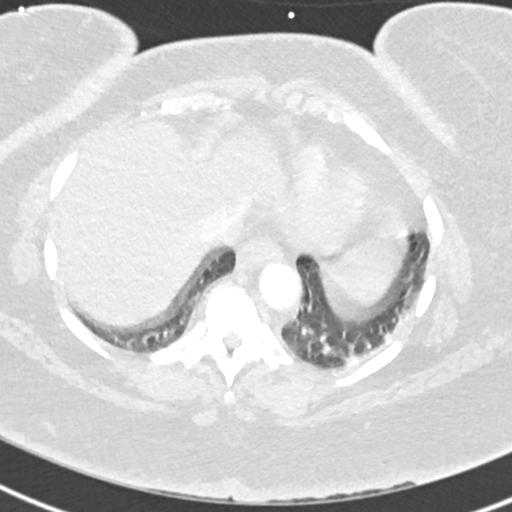
[im 53/233  soft-tissue]
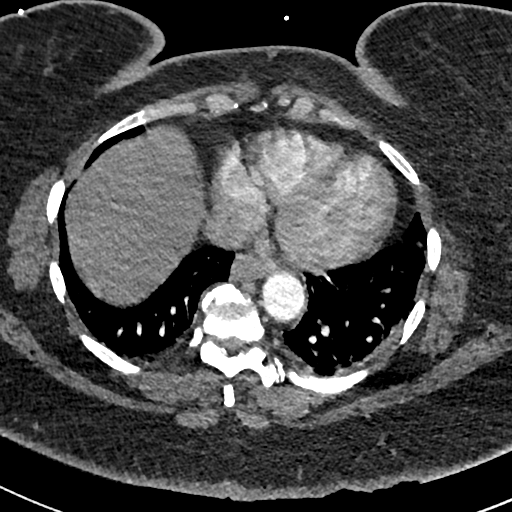
[im 74/233  lung]
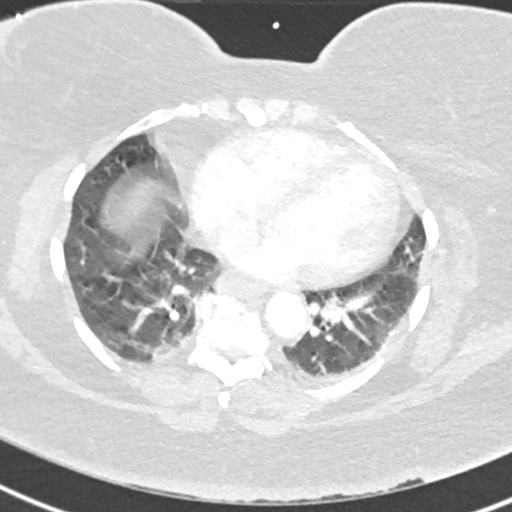
[im 85/233  soft-tissue]
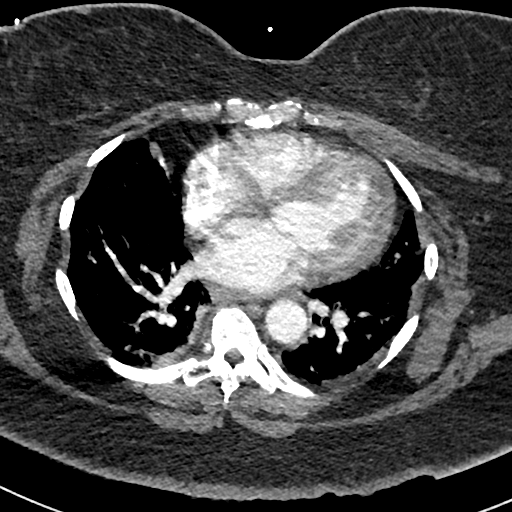
[im 106/233  lung]
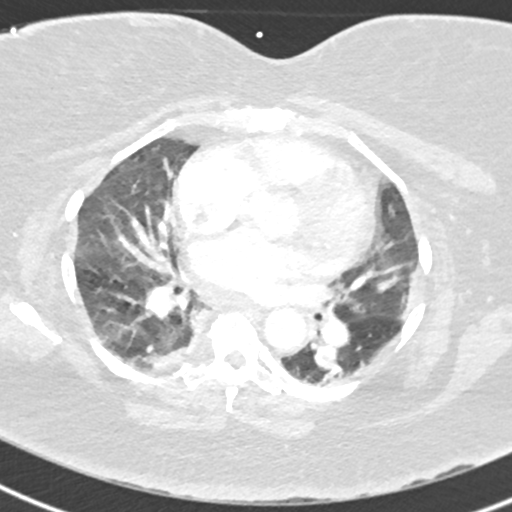
[im 117/233  soft-tissue]
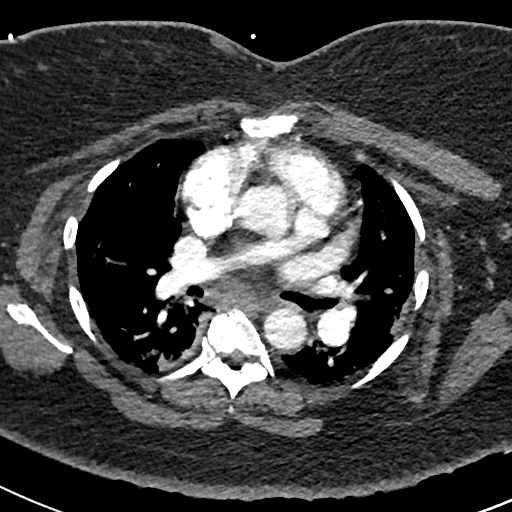
[im 127/233  lung]
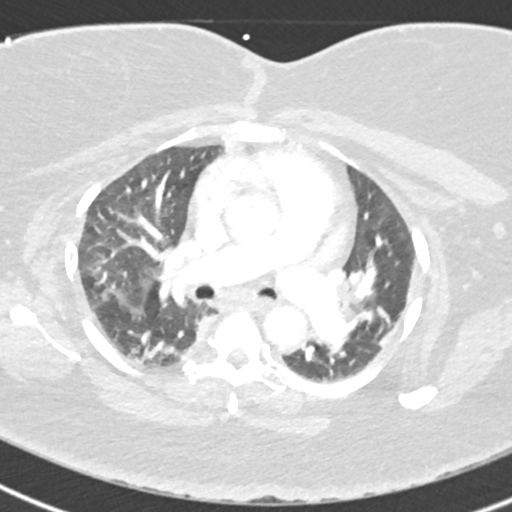
[im 148/233  soft-tissue]
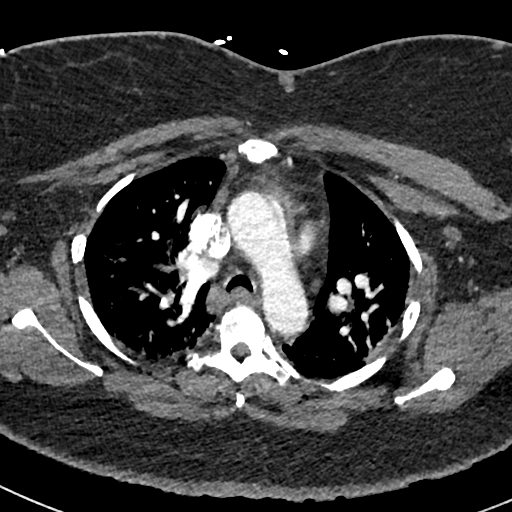
[im 159/233  lung]
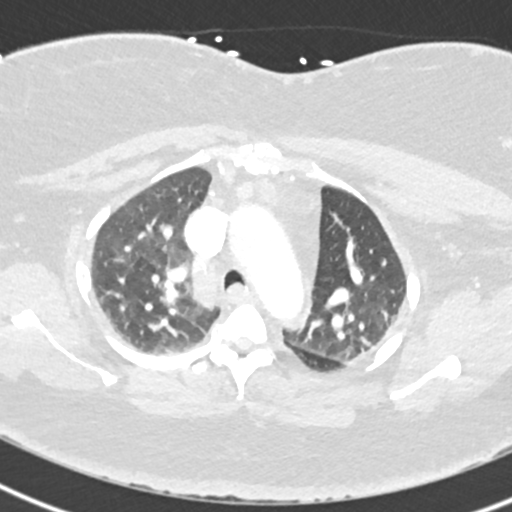
[im 180/233  soft-tissue]
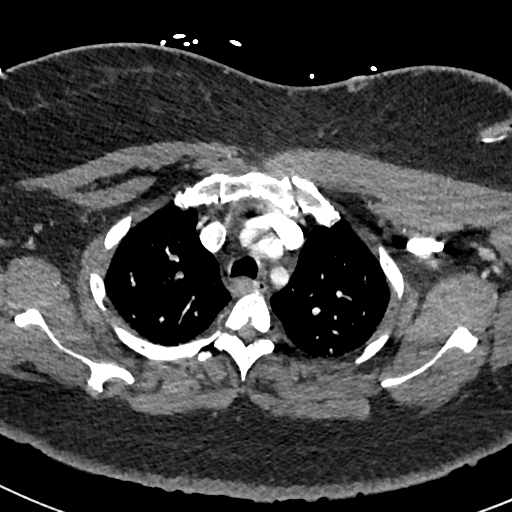
[im 190/233  lung]
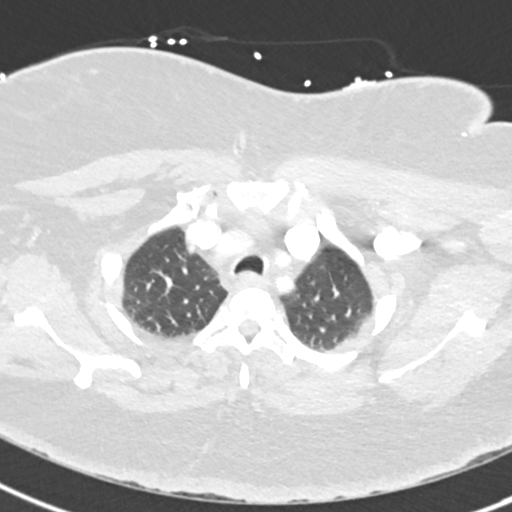
[im 201/233  soft-tissue]
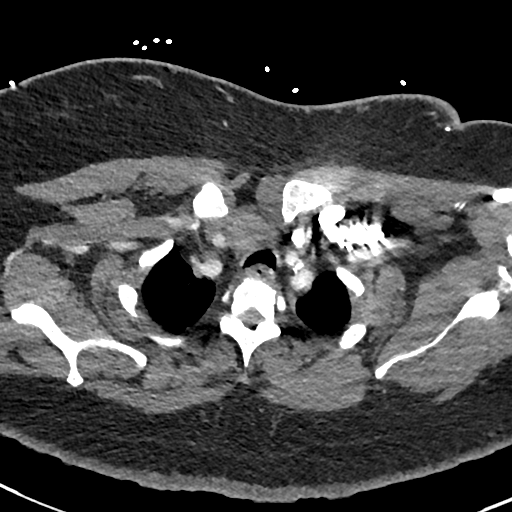
[im 222/233  lung]
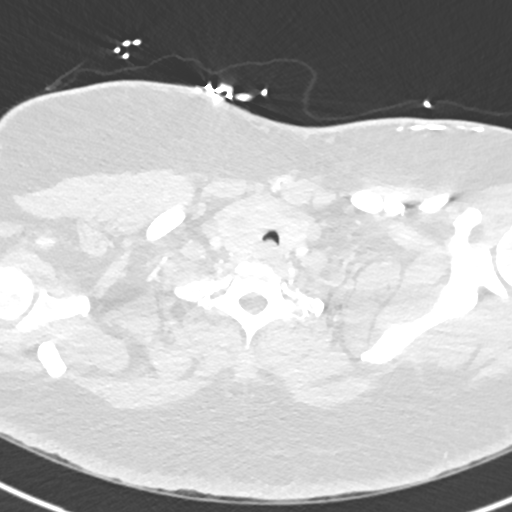

[Series 7: coronal mpr · coronal · 0.49mm/px · 3 of 124 slices shown]
[im 31/124  soft-tissue]
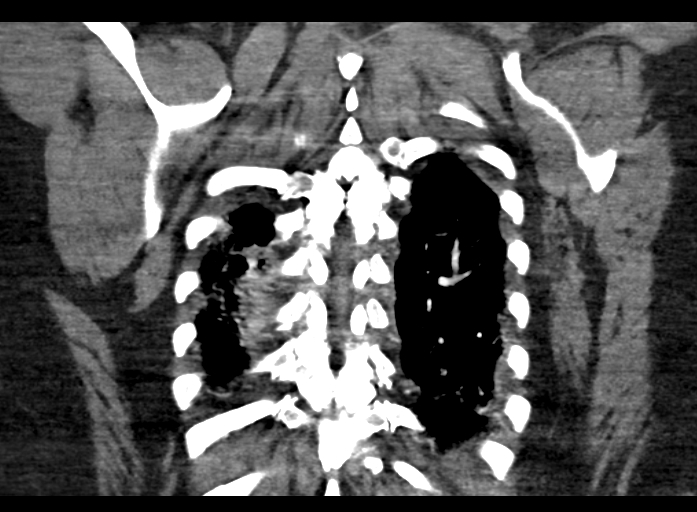
[im 62/124  soft-tissue]
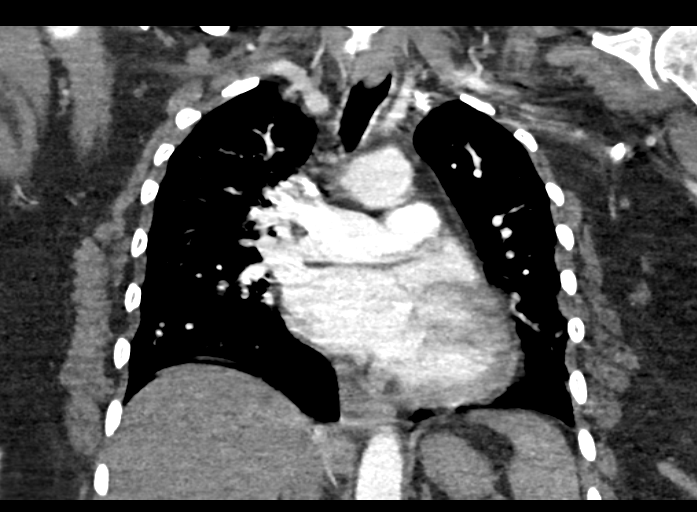
[im 93/124  soft-tissue]
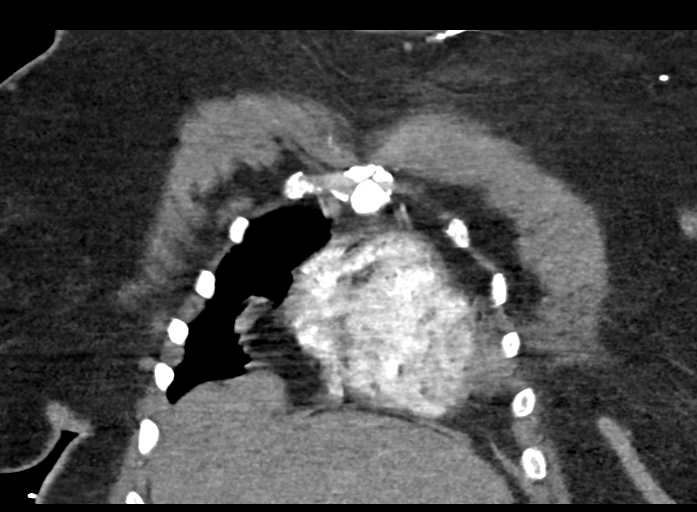

[18 of 46 positions shown; findings below may reference images not displayed]

FINDINGS: The study is mildly degraded by respiratory motion. No pulmonary
embolus is identified. There is cardiomegaly. The thyroid gland is
enlarged and heterogeneous but unchanged in appearance. Cardiomegaly
is again seen. No pleural or pericardial effusion. No axillary,
hilar or mediastinal lymphadenopathy. The lungs show only mild
dependent atelectasis. Visualized upper abdomen demonstrates fatty
infiltration of the liver. No focal bony abnormality is identified.

Review of the MIP images confirms the above findings.
IMPRESSION: Negative for pulmonary embolus.

Cardiomegaly.

Fatty infiltration liver.

Thyromegaly, unchanged.

## 2016-03-28 IMAGING — DX DG CHEST 2V
2 series · 2 of 2 positions shown · non-contrast
Comparison: 04/27/2014

CLINICAL DATA: Chest pain radiating to the left arm with left arm
numbness. Shortness of breath and cough.

EXAM:
CHEST  2 VIEW

[chest pa]
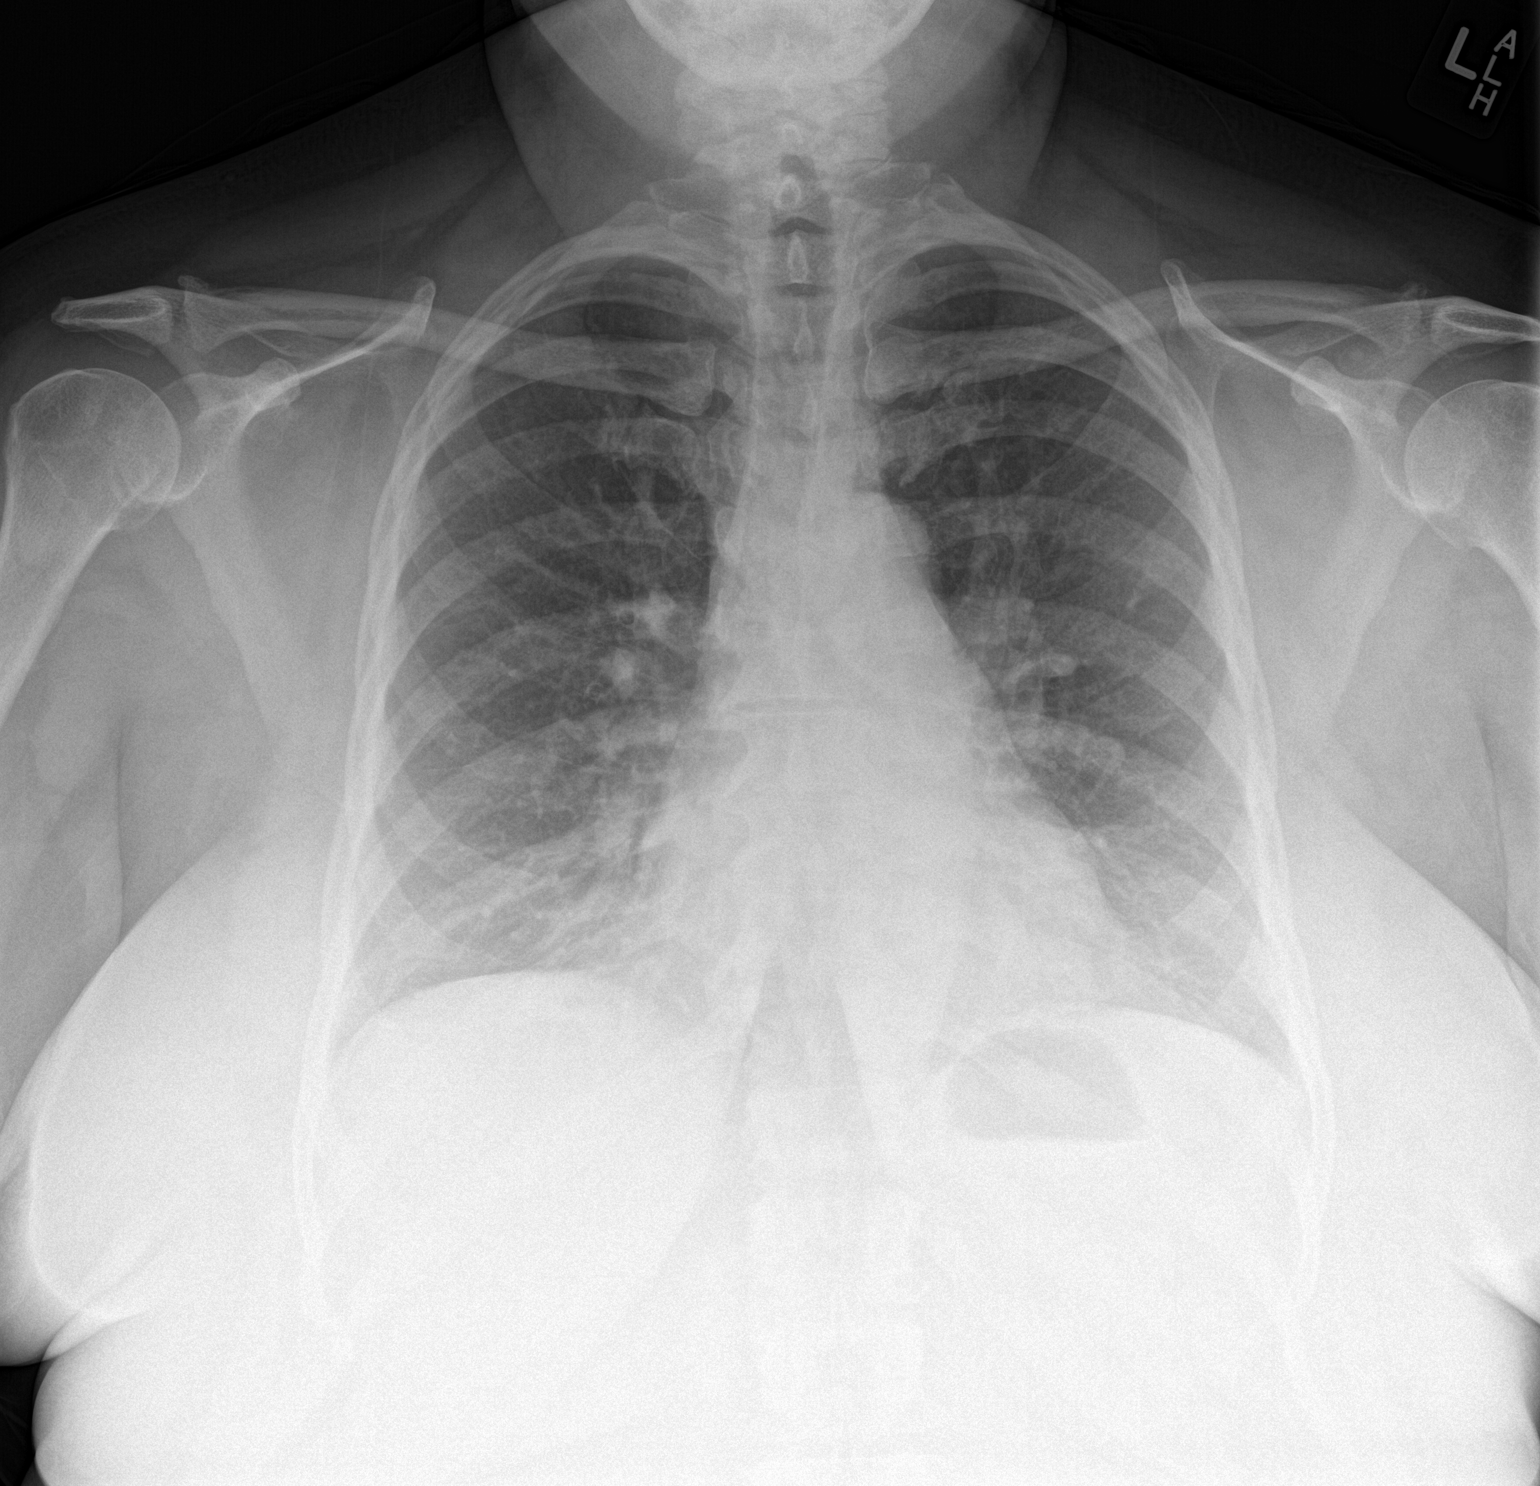

[chest lat]
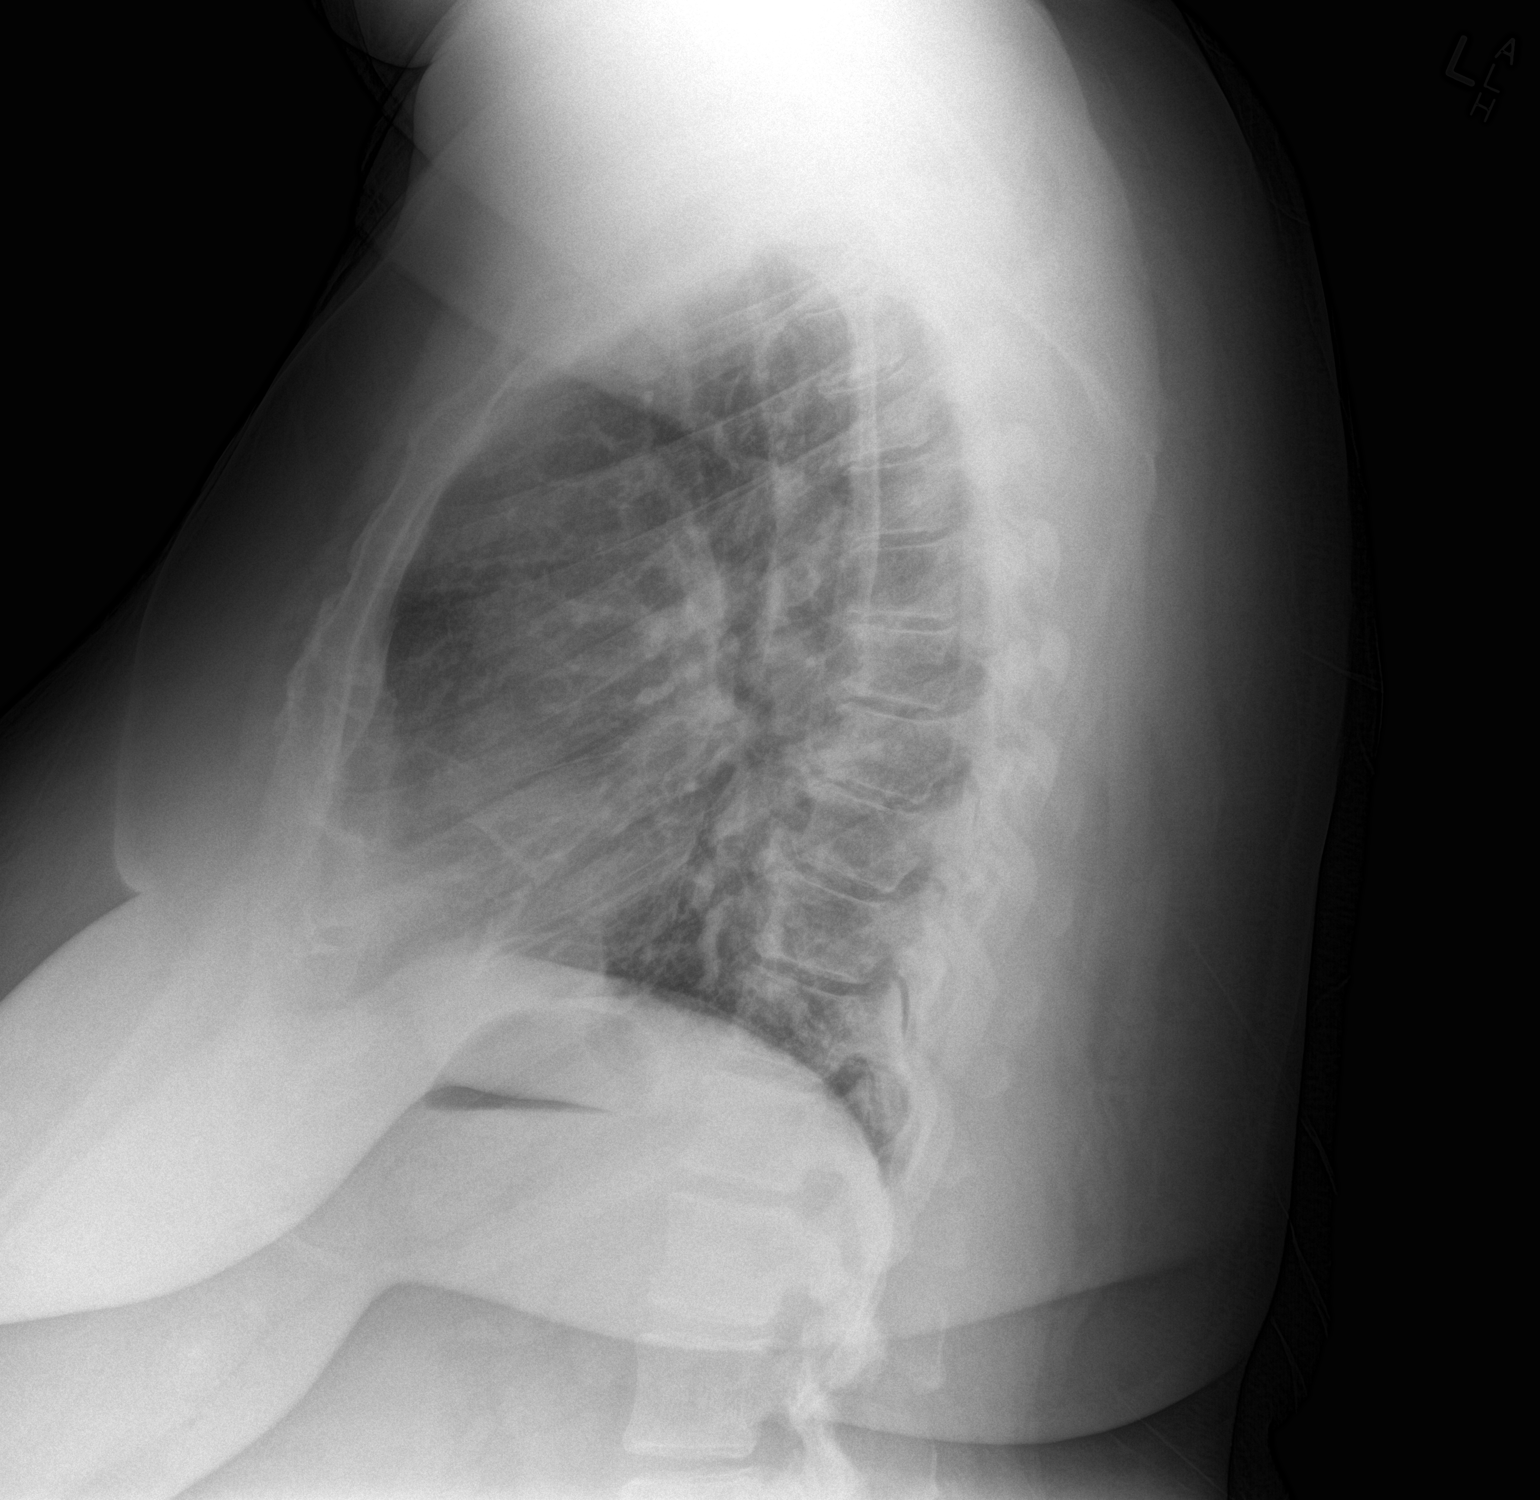

[2 of 2 positions shown; findings below may reference images not displayed]

FINDINGS: Normal heart size and pulmonary vascularity. Increased opacities
over the lung base is probably due to soft tissue attenuation. No
focal airspace disease or consolidation. No blunting of costophrenic
angles. No pneumothorax. Mediastinal contours appear intact.
Degenerative changes in the spine.
IMPRESSION: No active cardiopulmonary disease.

## 2016-03-30 IMAGING — MR MR CERVICAL SPINE W/O CM
4 of 5 series · 18 of 48 positions shown · non-contrast
Comparison: None.

CLINICAL DATA: Initial evaluation for upper and lower extremity
paresthesias.

EXAM:
MRI CERVICAL SPINE WITHOUT CONTRAST
TECHNIQUE: Multiplanar, multisequence MR imaging of the cervical spine was
performed. No intravenous contrast was administered.

[Series 2: T2 · sagittal · 3.0mm · 0.39mm/px · 7 of 14 slices shown (1 of 2)]
[im 1/14]
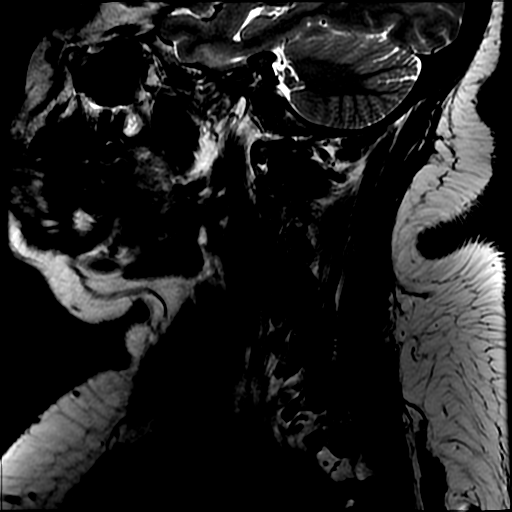
[im 3/14]
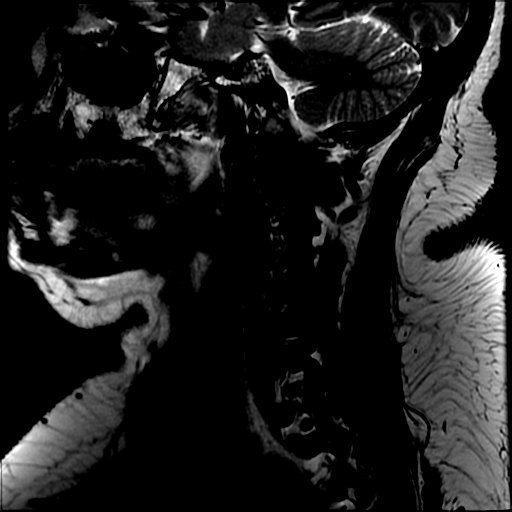
[im 5/14]
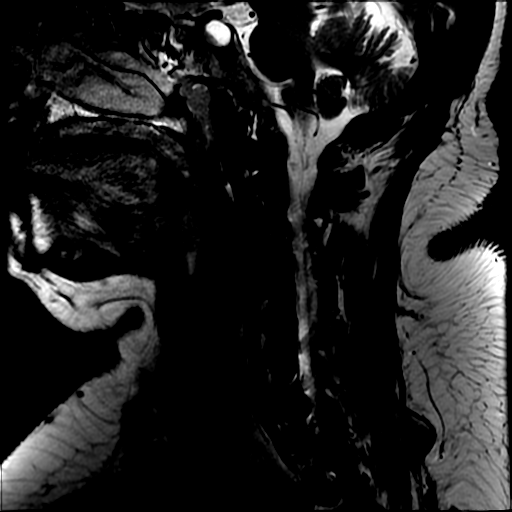
[im 7/14]
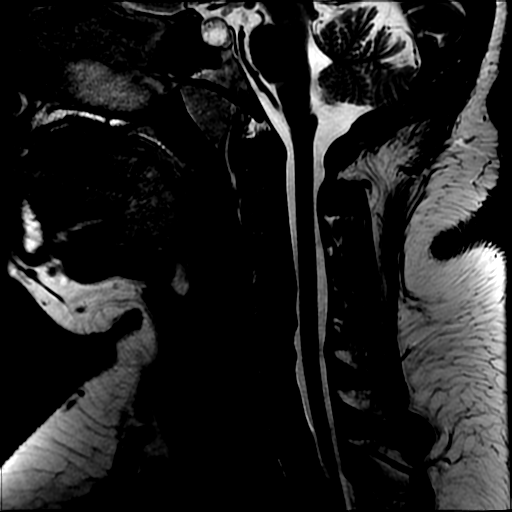
[im 9/14]
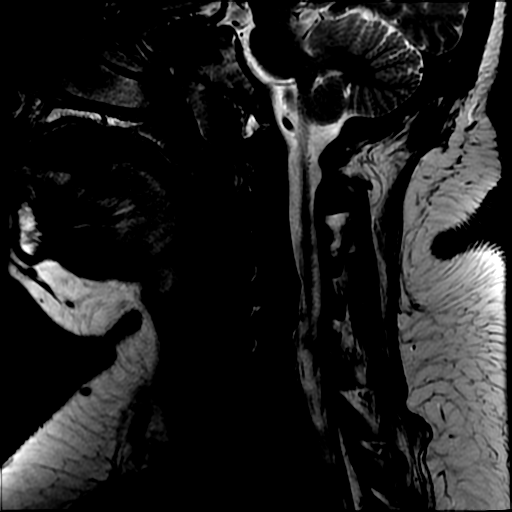
[im 11/14]
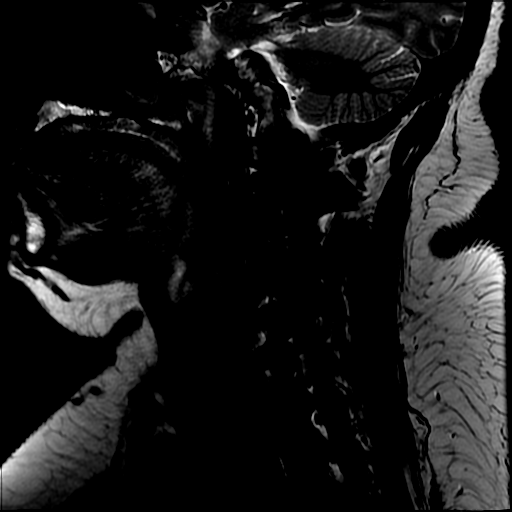
[im 14/14]
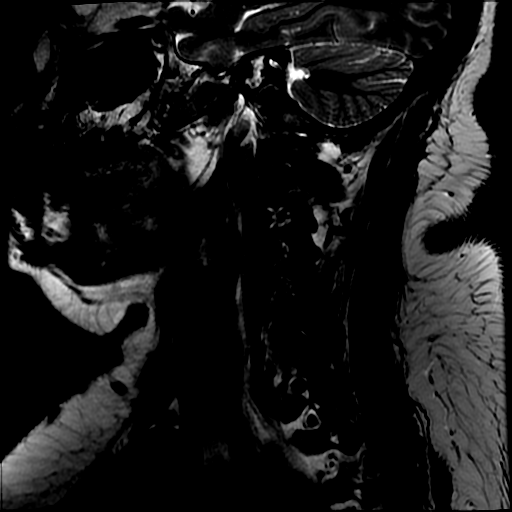

[Series 3: T1 · sagittal · 3.0mm · 0.39mm/px · 3 of 14 slices shown]
[im 3/14]
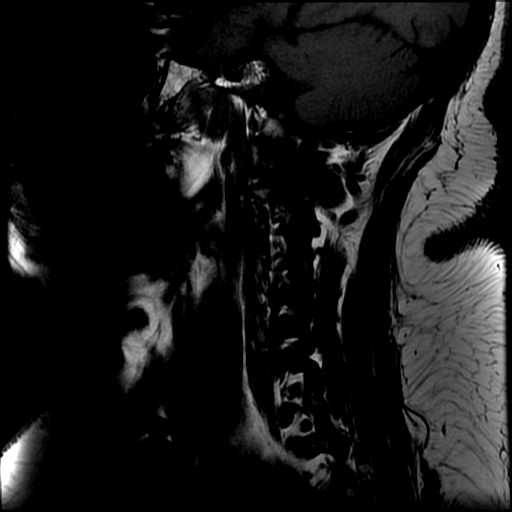
[im 7/14]
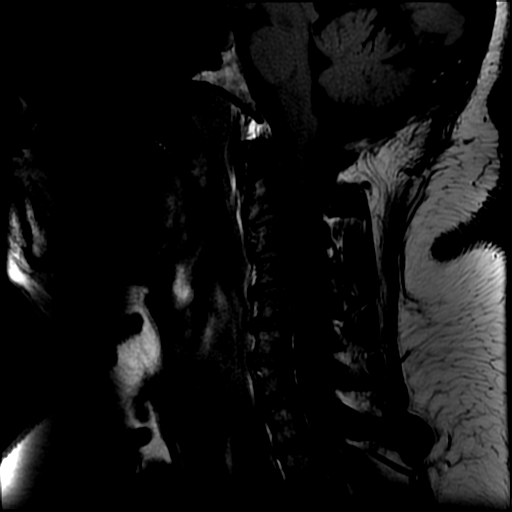
[im 11/14]
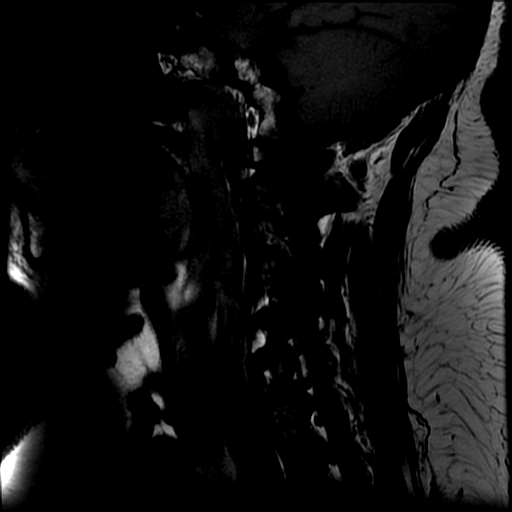

[Series 4: STIR · sagittal · 3.0mm · 0.39mm/px · 3 of 14 slices shown]
[im 3/14]
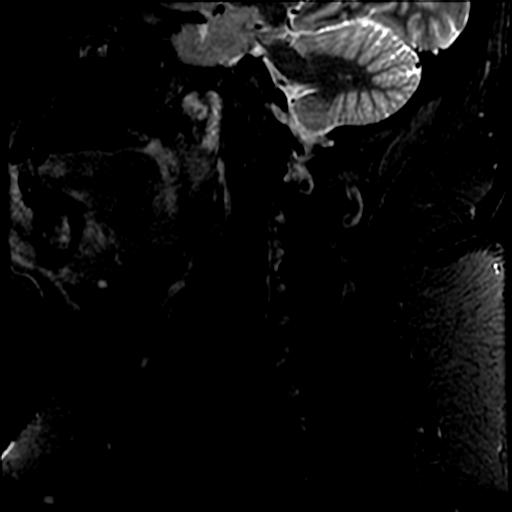
[im 8/14]
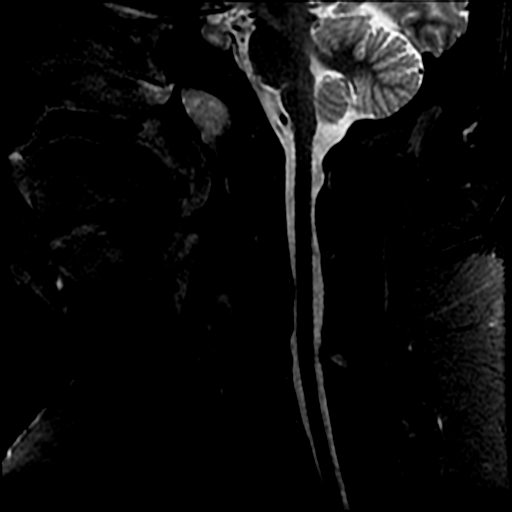
[im 14/14]
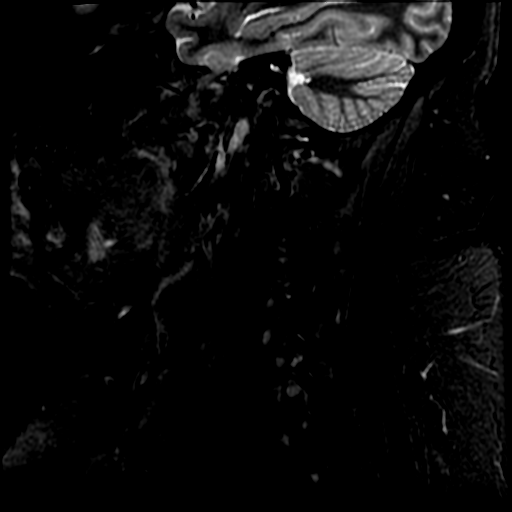

[Series 6: T2 · axial · 3.0mm · 0.35mm/px · z∈[-160,-78]mm · 5 of 31 slices shown (2 of 2)]
[im 1/31]
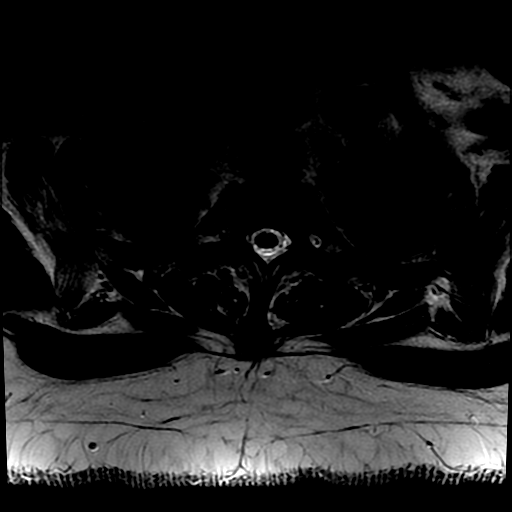
[im 5/31]
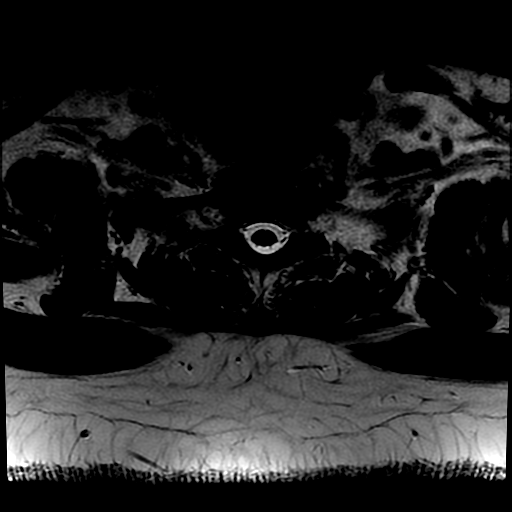
[im 10/31]
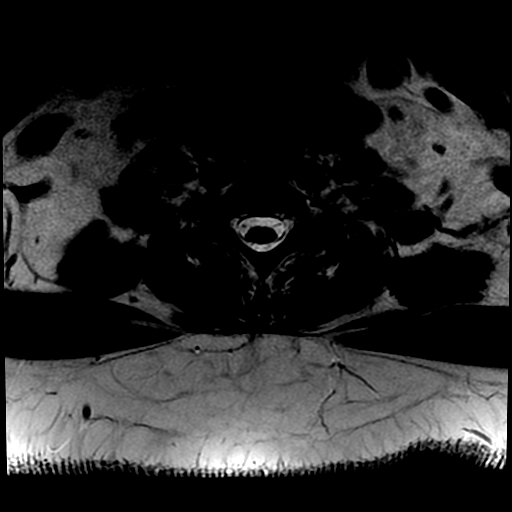
[im 17/31]
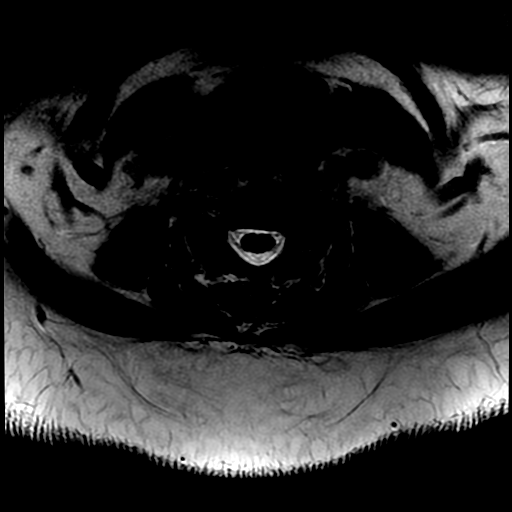
[im 26/31]
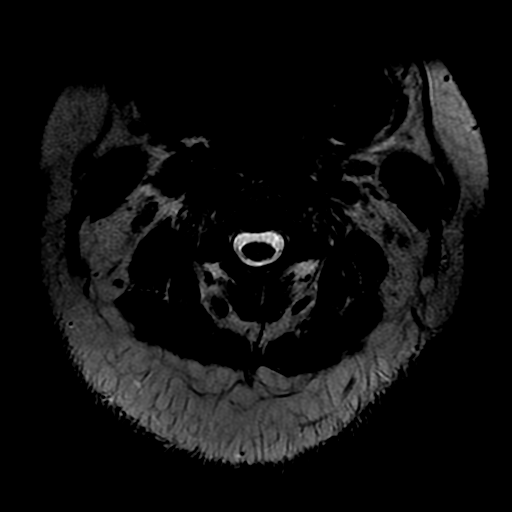

[18 of 48 positions shown; findings below may reference images not displayed]

FINDINGS: 12 mm cystic lesion noted within the pituitary gland, better
evaluated on recent MRI of the brain. Visualized brain and posterior
fossa are are otherwise unremarkable. Craniocervical junction widely
patent.

There is reversal of the normal cervical lordosis with apex at C4-5.
Trace anterolisthesis of C4 on C5 present. Overall, alignment is
stable from previous MRI from 3056. Vertebral body heights are
maintained. Signal intensity within the vertebral body bone marrow
is normal. No focal osseous lesion.

Paraspinous and prevertebral soft tissues are within normal limits.
Normal intravascular flow voids present within the vertebral
arteries bilaterally.

Signal intensity within the cervical spinal cord is normal.

C2-3: Mild bilateral uncovertebral spurring present without
significant stenosis.

C3-4: Mild bilateral uncovertebral spurring without significant
stenosis.

C4-5: Left eccentric circumferential degenerative disc osteophyte
complex with left-sided uncovertebral spurring and mild left facet
hypertrophy. There is resultant moderate to severe left foraminal
narrowing. Mild effacement of the left ventral thecal sac present
without significant canal stenosis. Mild right foraminal narrowing
present as well.

C5-6: Left eccentric degenerative disc osteophyte with uncovertebral
hypertrophy and facet arthrosis. There is fairly severe left sided
foraminal narrowing. Mild to moderate right foraminal stenosis
present as well. No central canal stenosis.

C6-7: Mild diffuse degenerative disc osteophyte complex, a centric
to the right, with right-sided uncovertebral spurring and
hypertrophy. There is moderate to severe right foraminal stenosis
with mild left foraminal narrowing. No significant central canal
stenosis.

C7-T1: Mild left-sided facet hypertrophy present. No significant
canal or foraminal stenosis.

Visualized upper thoracic spine within normal limits.
IMPRESSION: 1. Multilevel degenerative disc disease without significant spinal
canal stenosis.
2. Multifactorial degenerative changes with resultant moderate to
severe foraminal stenosis on the left at C4-5 and C5-6, and on the
right at C6-7. Overall, these changes are similar relative to most
recent MRI from 01/09/2013.
[DATE]. Stable cystic pituitary lesion, better evaluated on recent brain
MRI.

## 2016-04-20 IMAGING — US US THYROID BIOPSY
1 series · 13 of 15 positions shown · non-contrast
Comparison: none

CLINICAL DATA: 53-year-old female with a history of right-sided
thyroid nodule

[Series 1: us thyroid biopsy · 0.09mm/px · 15 acquisitions, 13 frames shown]
[im 1/15]
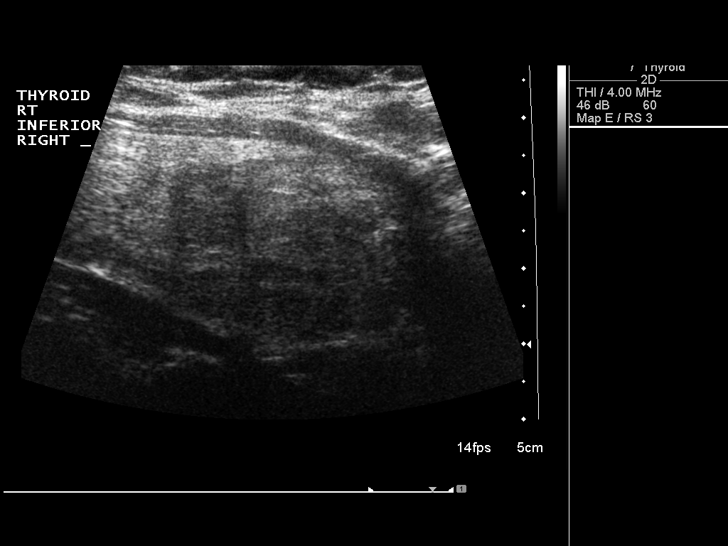
[im 2/15]
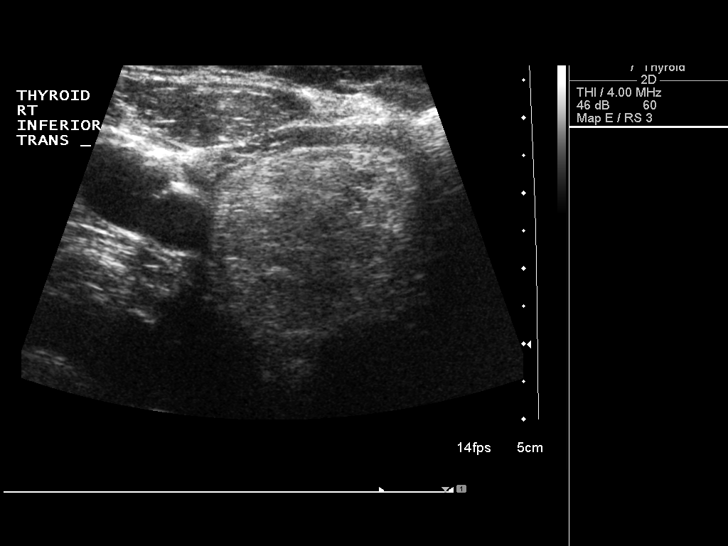
[im 3/15]
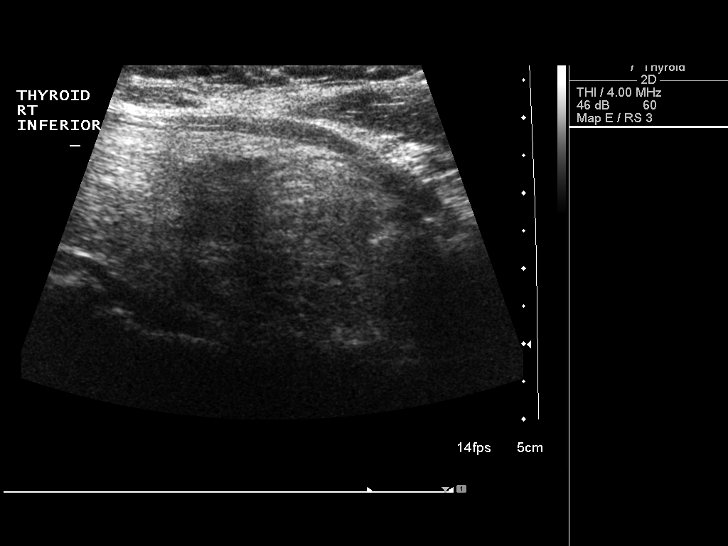
[im 5/15]
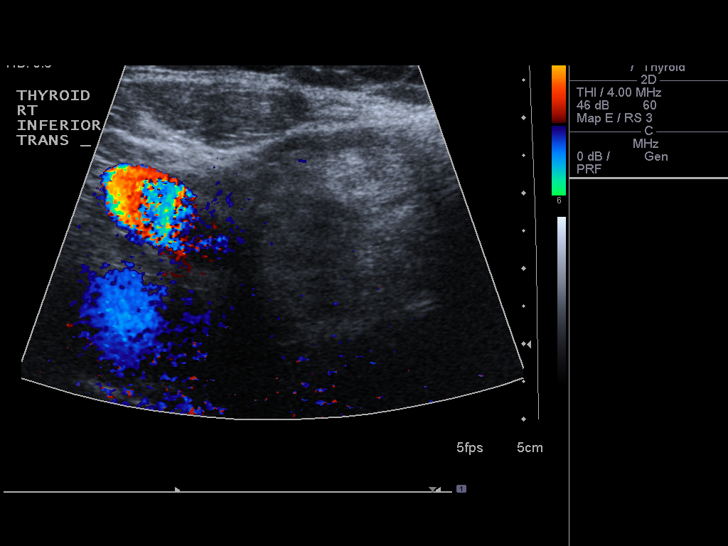
[im 6/15]
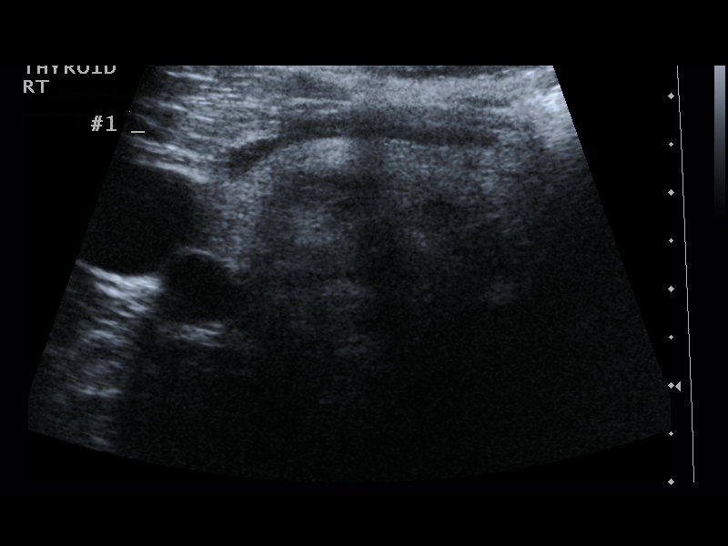
[im 7/15]
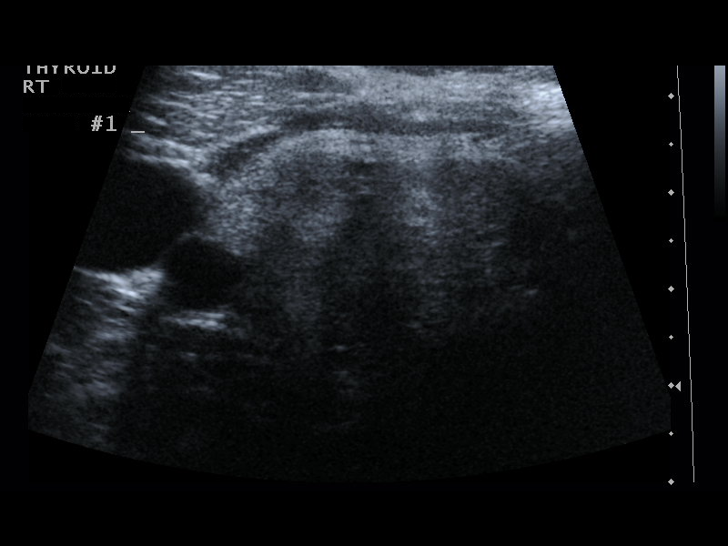
[im 8/15]
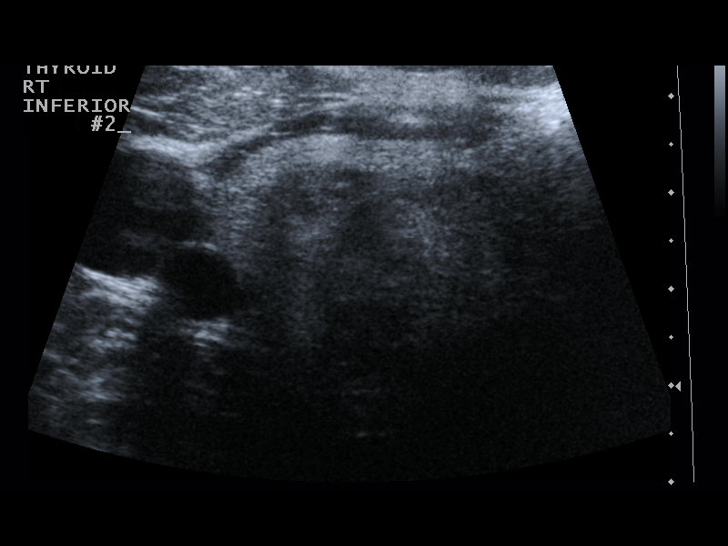
[im 9/15]
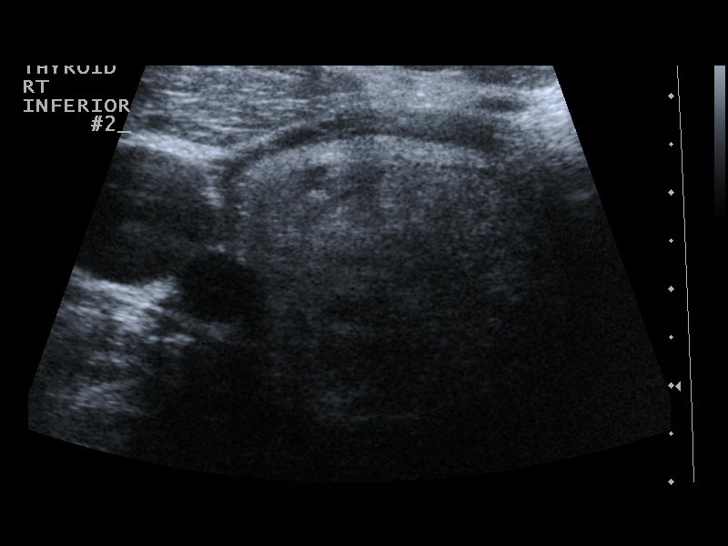
[im 10/15]
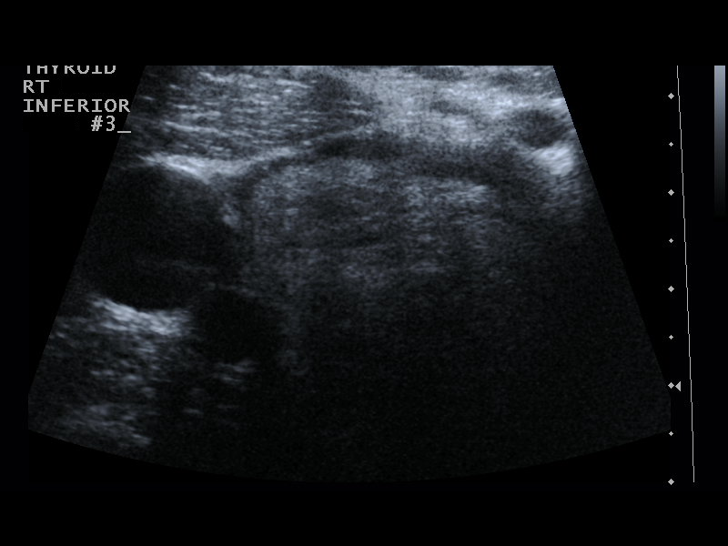
[im 11/15]
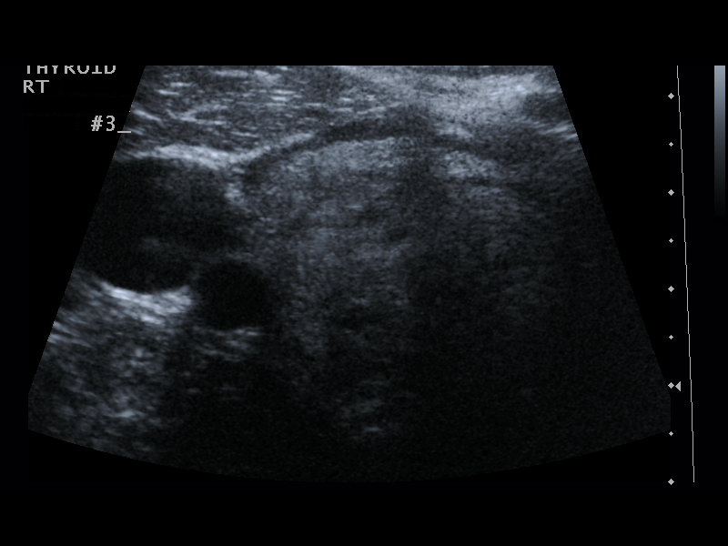
[im 13/15]
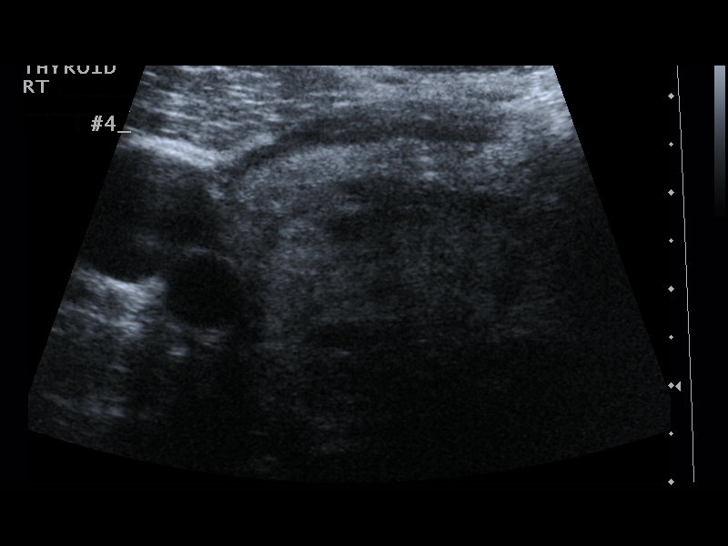
[im 14/15]
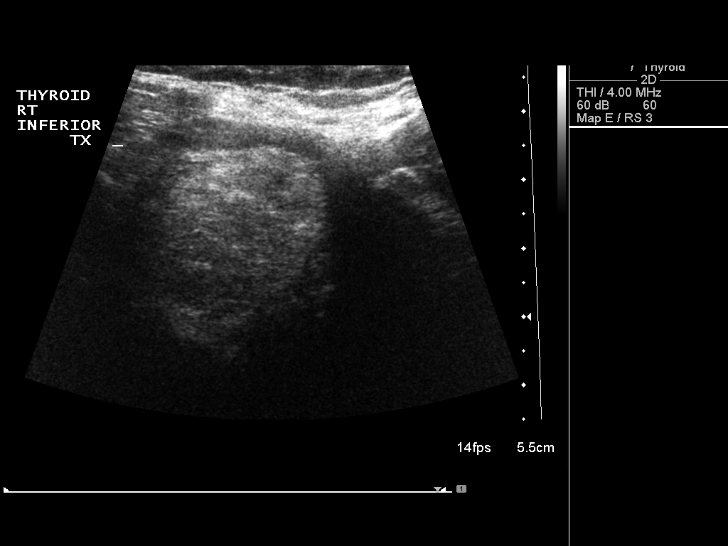
[im 15/15]
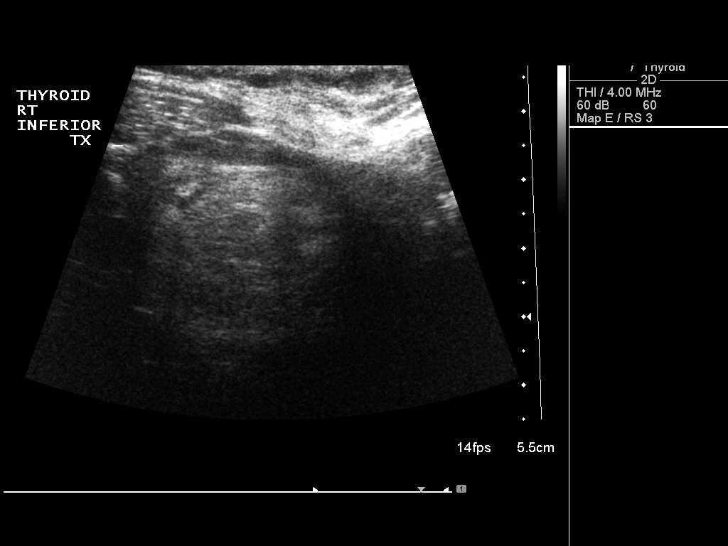

[13 of 15 positions shown; findings below may reference images not displayed]

EXAM:
ULTRASOUND GUIDED FINE NEEDLE BIOPSY OF RIGHT THYROID NODULE

MEDICATIONS:
Zero mg IV Versed; 0 mcg IV Fentanyl

Total Moderate Sedation Time: 0

PROCEDURE:
The procedure, risks, benefits, and alternatives were explained to
the patient. Questions regarding the procedure were encouraged and
answered. The patient understands and consents to the procedure.

Ultrasound survey was performed with images stored and sent to PACs.

The right neck was prepped with Betadine in a sterile fashion, and a
sterile drape was applied covering the operative field. A sterile
gown and sterile gloves were used for the procedure. Local
anesthesia was provided with 1% Lidocaine.

Ultrasound guidance was used to infiltrate the region with 1%
lidocaine for local anesthesia. Four separate 25 gauge fine needle
biopsy were then acquired of the right thyroid nodule using
ultrasound guidance. Images were stored.

Slide preparation was performed.

Final image was stored after biopsy.

Patient tolerated the procedure well and remained hemodynamically
stable throughout.

No complications were encountered and no significant blood loss was
encounter

COMPLICATIONS:
None.
FINDINGS: Ultrasound survey demonstrates right-sided thyroid nodule, similar
to a prior study completed at outside institution.

Images during the case demonstrate needle tip within the nodule on
each needle pass.

Final image demonstrates no complicating features.
IMPRESSION: Status post ultrasound-guided biopsy of right-sided thyroid nodule.
Tissue specimen sent to pathology for complete histopathologic
analysis.

## 2016-05-21 ENCOUNTER — Observation Stay (HOSPITAL_COMMUNITY): Payer: Medicare Other

## 2016-05-21 ENCOUNTER — Inpatient Hospital Stay (HOSPITAL_COMMUNITY)
Admission: EM | Admit: 2016-05-21 | Discharge: 2016-05-28 | DRG: 193 | Disposition: A | Discharge status: Home / Self Care | Payer: Medicare Other | Attending: Internal Medicine | Admitting: Internal Medicine

## 2016-05-21 ENCOUNTER — Encounter (HOSPITAL_COMMUNITY): Payer: Self-pay | Admitting: Emergency Medicine

## 2016-05-21 ENCOUNTER — Emergency Department (HOSPITAL_COMMUNITY): Payer: Medicare Other

## 2016-05-21 DIAGNOSIS — Z882 Allergy status to sulfonamides status: Secondary | ICD-10-CM

## 2016-05-21 DIAGNOSIS — E1165 Type 2 diabetes mellitus with hyperglycemia: Secondary | ICD-10-CM | POA: Diagnosis present

## 2016-05-21 DIAGNOSIS — Z880 Allergy status to penicillin: Secondary | ICD-10-CM

## 2016-05-21 DIAGNOSIS — E119 Type 2 diabetes mellitus without complications: Secondary | ICD-10-CM

## 2016-05-21 DIAGNOSIS — Z86711 Personal history of pulmonary embolism: Secondary | ICD-10-CM

## 2016-05-21 DIAGNOSIS — Z79899 Other long term (current) drug therapy: Secondary | ICD-10-CM

## 2016-05-21 DIAGNOSIS — R0602 Shortness of breath: Secondary | ICD-10-CM

## 2016-05-21 DIAGNOSIS — G4730 Sleep apnea, unspecified: Secondary | ICD-10-CM | POA: Diagnosis present

## 2016-05-21 DIAGNOSIS — E669 Obesity, unspecified: Secondary | ICD-10-CM

## 2016-05-21 DIAGNOSIS — E1169 Type 2 diabetes mellitus with other specified complication: Secondary | ICD-10-CM

## 2016-05-21 DIAGNOSIS — E785 Hyperlipidemia, unspecified: Secondary | ICD-10-CM | POA: Diagnosis present

## 2016-05-21 DIAGNOSIS — I10 Essential (primary) hypertension: Secondary | ICD-10-CM | POA: Diagnosis present

## 2016-05-21 DIAGNOSIS — T380X5A Adverse effect of glucocorticoids and synthetic analogues, initial encounter: Secondary | ICD-10-CM | POA: Diagnosis present

## 2016-05-21 DIAGNOSIS — Z794 Long term (current) use of insulin: Secondary | ICD-10-CM

## 2016-05-21 DIAGNOSIS — Z833 Family history of diabetes mellitus: Secondary | ICD-10-CM

## 2016-05-21 DIAGNOSIS — Z885 Allergy status to narcotic agent status: Secondary | ICD-10-CM

## 2016-05-21 DIAGNOSIS — J189 Pneumonia, unspecified organism: Secondary | ICD-10-CM | POA: Diagnosis not present

## 2016-05-21 DIAGNOSIS — I5032 Chronic diastolic (congestive) heart failure: Secondary | ICD-10-CM | POA: Diagnosis present

## 2016-05-21 DIAGNOSIS — Z85830 Personal history of malignant neoplasm of bone: Secondary | ICD-10-CM

## 2016-05-21 DIAGNOSIS — R079 Chest pain, unspecified: Secondary | ICD-10-CM | POA: Diagnosis not present

## 2016-05-21 DIAGNOSIS — J181 Lobar pneumonia, unspecified organism: Secondary | ICD-10-CM | POA: Diagnosis not present

## 2016-05-21 DIAGNOSIS — Z7982 Long term (current) use of aspirin: Secondary | ICD-10-CM

## 2016-05-21 DIAGNOSIS — I5033 Acute on chronic diastolic (congestive) heart failure: Secondary | ICD-10-CM | POA: Diagnosis present

## 2016-05-21 DIAGNOSIS — J9601 Acute respiratory failure with hypoxia: Secondary | ICD-10-CM | POA: Diagnosis present

## 2016-05-21 DIAGNOSIS — I5030 Unspecified diastolic (congestive) heart failure: Secondary | ICD-10-CM | POA: Diagnosis present

## 2016-05-21 DIAGNOSIS — E041 Nontoxic single thyroid nodule: Secondary | ICD-10-CM | POA: Diagnosis present

## 2016-05-21 DIAGNOSIS — Z87891 Personal history of nicotine dependence: Secondary | ICD-10-CM

## 2016-05-21 DIAGNOSIS — R110 Nausea: Secondary | ICD-10-CM | POA: Diagnosis present

## 2016-05-21 DIAGNOSIS — F419 Anxiety disorder, unspecified: Secondary | ICD-10-CM | POA: Diagnosis present

## 2016-05-21 DIAGNOSIS — I248 Other forms of acute ischemic heart disease: Secondary | ICD-10-CM | POA: Diagnosis present

## 2016-05-21 DIAGNOSIS — E1140 Type 2 diabetes mellitus with diabetic neuropathy, unspecified: Secondary | ICD-10-CM | POA: Diagnosis present

## 2016-05-21 DIAGNOSIS — F329 Major depressive disorder, single episode, unspecified: Secondary | ICD-10-CM | POA: Diagnosis present

## 2016-05-21 DIAGNOSIS — Z6841 Body Mass Index (BMI) 40.0 and over, adult: Secondary | ICD-10-CM

## 2016-05-21 DIAGNOSIS — I110 Hypertensive heart disease with heart failure: Secondary | ICD-10-CM | POA: Diagnosis present

## 2016-05-21 LAB — BASIC METABOLIC PANEL
Anion gap: 8 (ref 5–15)
BUN: 8 mg/dL (ref 6–20)
CO2: 27 mmol/L (ref 22–32)
Calcium: 8.8 mg/dL — ABNORMAL LOW (ref 8.9–10.3)
Chloride: 107 mmol/L (ref 101–111)
Creatinine, Ser: 0.9 mg/dL (ref 0.44–1.00)
GFR calc Af Amer: >60 mL/min (ref >60)
GFR calc non Af Amer: >60 mL/min (ref >60)
Glucose, Bld: 148 mg/dL — ABNORMAL HIGH (ref 65–99)
Potassium: 3.6 mmol/L (ref 3.5–5.1)
Sodium: 142 mmol/L (ref 135–145)

## 2016-05-21 LAB — MAGNESIUM: Magnesium: 1.9 mg/dL (ref 1.7–2.4)

## 2016-05-21 LAB — CBC WITH DIFFERENTIAL/PLATELET
Band Neutrophils: 6 %
Basophils Absolute: 0.1 10*3/uL (ref 0.0–0.1)
Basophils Relative: 1 %
Blasts: 0 %
Eosinophils Absolute: 1.9 10*3/uL — ABNORMAL HIGH (ref 0.0–0.7)
Eosinophils Relative: 31 %
HCT: 37 % (ref 36.0–46.0)
Hemoglobin: 11.9 g/dL — ABNORMAL LOW (ref 12.0–15.0)
Lymphocytes Relative: 33 %
Lymphs Abs: 2 10*3/uL (ref 0.7–4.0)
MCH: 27.9 pg (ref 26.0–34.0)
MCHC: 32.2 g/dL (ref 30.0–36.0)
MCV: 86.7 fL (ref 78.0–100.0)
Metamyelocytes Relative: 0 %
Monocytes Absolute: 0.2 10*3/uL (ref 0.1–1.0)
Monocytes Relative: 4 %
Myelocytes: 0 %
Neutro Abs: 1.7 10*3/uL (ref 1.7–7.7)
Neutrophils Relative %: 22 %
Other: 3 %
Platelets: 185 10*3/uL (ref 150–400)
Promyelocytes Absolute: 0 %
RBC: 4.27 MIL/uL (ref 3.87–5.11)
RDW: 14 % (ref 11.5–15.5)
WBC: 6 10*3/uL (ref 4.0–10.5)
nRBC: 0 /100 WBC

## 2016-05-21 LAB — GLUCOSE, CAPILLARY
Glucose-Capillary: 240 mg/dL — ABNORMAL HIGH (ref 65–99)
Glucose-Capillary: 238 mg/dL — ABNORMAL HIGH (ref 65–99)

## 2016-05-21 LAB — TROPONIN I
Troponin I: 0.05 ng/mL (ref <0.03)
Troponin I: 0.05 ng/mL (ref <0.03)
Troponin I: 0.05 ng/mL (ref <0.03)

## 2016-05-21 LAB — CBC
HCT: 35.7 % — ABNORMAL LOW (ref 36.0–46.0)
Hemoglobin: 11.3 g/dL — ABNORMAL LOW (ref 12.0–15.0)
MCH: 27.6 pg (ref 26.0–34.0)
MCHC: 31.7 g/dL (ref 30.0–36.0)
MCV: 87.3 fL (ref 78.0–100.0)
Platelets: 185 10*3/uL (ref 150–400)
RBC: 4.09 MIL/uL (ref 3.87–5.11)
RDW: 14 % (ref 11.5–15.5)
WBC: 5.5 10*3/uL (ref 4.0–10.5)

## 2016-05-21 LAB — INFLUENZA PANEL BY PCR (TYPE A & B)
Influenza A By PCR: NEGATIVE
Influenza B By PCR: NEGATIVE

## 2016-05-21 LAB — CBG MONITORING, ED: Glucose-Capillary: 138 mg/dL — ABNORMAL HIGH (ref 65–99)

## 2016-05-21 LAB — CREATININE, SERUM
Creatinine, Ser: 0.84 mg/dL (ref 0.44–1.00)
GFR calc Af Amer: >60 mL/min (ref >60)
GFR calc non Af Amer: >60 mL/min (ref >60)

## 2016-05-21 MED ORDER — IOPAMIDOL (ISOVUE-370) INJECTION 76%
INTRAVENOUS | Status: AC
  Administered 2016-05-21: 100 mL
  Filled 2016-05-21: qty 100

## 2016-05-21 MED ORDER — ONDANSETRON HCL 4 MG PO TABS
4.0000 mg | ORAL_TABLET | Freq: Four times a day (QID) | ORAL | Status: DC | PRN
  Administered 2016-05-22: 4 mg via ORAL
  Filled 2016-05-21: qty 1

## 2016-05-21 MED ORDER — ASPIRIN 325 MG PO TABS
325.0000 mg | ORAL_TABLET | Freq: Every day | ORAL | Status: DC
  Administered 2016-05-21 – 2016-05-28 (×8): 325 mg via ORAL
  Filled 2016-05-21 (×8): qty 1

## 2016-05-21 MED ORDER — TRAZODONE HCL 50 MG PO TABS
25.0000 mg | ORAL_TABLET | Freq: Every evening | ORAL | Status: DC | PRN
  Administered 2016-05-21 – 2016-05-24 (×4): 25 mg via ORAL
  Filled 2016-05-21 (×4): qty 1

## 2016-05-21 MED ORDER — ENOXAPARIN SODIUM 40 MG/0.4ML ~~LOC~~ SOLN
40.0000 mg | SUBCUTANEOUS | Status: DC
  Administered 2016-05-22 – 2016-05-28 (×7): 40 mg via SUBCUTANEOUS
  Filled 2016-05-21 (×7): qty 0.4

## 2016-05-21 MED ORDER — ACETAMINOPHEN 650 MG RE SUPP: 650.0000 mg | Freq: Four times a day (QID) | RECTAL | Status: DC | PRN

## 2016-05-21 MED ORDER — ACETAMINOPHEN 325 MG PO TABS
650.0000 mg | ORAL_TABLET | Freq: Four times a day (QID) | ORAL | Status: DC | PRN
  Administered 2016-05-21 – 2016-05-26 (×6): 650 mg via ORAL
  Filled 2016-05-21 (×6): qty 2

## 2016-05-21 MED ORDER — IPRATROPIUM-ALBUTEROL 0.5-2.5 (3) MG/3ML IN SOLN
3.0000 mL | Freq: Once | RESPIRATORY_TRACT | Status: AC
  Administered 2016-05-21: 3 mL via RESPIRATORY_TRACT
  Filled 2016-05-21: qty 3

## 2016-05-21 MED ORDER — POLYETHYLENE GLYCOL 3350 17 G PO PACK: 17.0000 g | PACK | Freq: Every day | ORAL | Status: DC | PRN

## 2016-05-21 MED ORDER — INSULIN ASPART 100 UNIT/ML ~~LOC~~ SOLN
0.0000 [IU] | Freq: Three times a day (TID) | SUBCUTANEOUS | Status: DC
  Administered 2016-05-21: 3 [IU] via SUBCUTANEOUS
  Administered 2016-05-21: 1 [IU] via SUBCUTANEOUS
  Administered 2016-05-22 (×2): 3 [IU] via SUBCUTANEOUS
  Administered 2016-05-22: 5 [IU] via SUBCUTANEOUS
  Administered 2016-05-23: 7 [IU] via SUBCUTANEOUS
  Administered 2016-05-23: 3 [IU] via SUBCUTANEOUS
  Administered 2016-05-23 – 2016-05-24 (×3): 5 [IU] via SUBCUTANEOUS
  Filled 2016-05-21: qty 1

## 2016-05-21 MED ORDER — NITROGLYCERIN 0.4 MG SL SUBL: 0.4000 mg | SUBLINGUAL_TABLET | SUBLINGUAL | Status: DC | PRN

## 2016-05-21 MED ORDER — GABAPENTIN 300 MG PO CAPS
600.0000 mg | ORAL_CAPSULE | Freq: Three times a day (TID) | ORAL | Status: DC
  Administered 2016-05-21 – 2016-05-28 (×23): 600 mg via ORAL
  Filled 2016-05-21 (×23): qty 2

## 2016-05-21 MED ORDER — METHYLPREDNISOLONE SODIUM SUCC 125 MG IJ SOLR
60.0000 mg | Freq: Two times a day (BID) | INTRAMUSCULAR | Status: DC
  Administered 2016-05-21 – 2016-05-22 (×4): 60 mg via INTRAVENOUS
  Filled 2016-05-21 (×4): qty 2

## 2016-05-21 MED ORDER — IPRATROPIUM-ALBUTEROL 0.5-2.5 (3) MG/3ML IN SOLN
3.0000 mL | Freq: Four times a day (QID) | RESPIRATORY_TRACT | Status: DC
  Administered 2016-05-22 – 2016-05-24 (×8): 3 mL via RESPIRATORY_TRACT
  Filled 2016-05-21 (×8): qty 3

## 2016-05-21 MED ORDER — ONDANSETRON HCL 4 MG/2ML IJ SOLN
4.0000 mg | Freq: Four times a day (QID) | INTRAMUSCULAR | Status: DC | PRN
  Administered 2016-05-22: 4 mg via INTRAVENOUS
  Filled 2016-05-21: qty 2

## 2016-05-21 MED ORDER — SERTRALINE HCL 100 MG PO TABS
100.0000 mg | ORAL_TABLET | Freq: Every day | ORAL | Status: DC
Start: 2016-05-21 — End: 2016-05-28
  Administered 2016-05-21 – 2016-05-28 (×8): 100 mg via ORAL
  Filled 2016-05-21 (×8): qty 1

## 2016-05-21 MED ORDER — CYCLOBENZAPRINE HCL 5 MG PO TABS
5.0000 mg | ORAL_TABLET | Freq: Two times a day (BID) | ORAL | Status: DC
  Administered 2016-05-21 – 2016-05-28 (×14): 5 mg via ORAL
  Filled 2016-05-21 (×14): qty 1

## 2016-05-21 MED ORDER — IPRATROPIUM-ALBUTEROL 0.5-2.5 (3) MG/3ML IN SOLN
3.0000 mL | RESPIRATORY_TRACT | Status: DC | PRN
  Administered 2016-05-22 (×2): 3 mL via RESPIRATORY_TRACT
  Filled 2016-05-21 (×2): qty 3

## 2016-05-21 MED ORDER — IPRATROPIUM-ALBUTEROL 0.5-2.5 (3) MG/3ML IN SOLN
3.0000 mL | RESPIRATORY_TRACT | Status: DC
Start: 2016-05-21 — End: 2016-05-21
  Administered 2016-05-21 (×3): 3 mL via RESPIRATORY_TRACT
  Filled 2016-05-21 (×3): qty 3

## 2016-05-21 MED ORDER — PANTOPRAZOLE SODIUM 40 MG PO TBEC
40.0000 mg | DELAYED_RELEASE_TABLET | Freq: Every day | ORAL | Status: DC
  Administered 2016-05-21 – 2016-05-28 (×8): 40 mg via ORAL
  Filled 2016-05-21 (×8): qty 1

## 2016-05-21 MED ORDER — FUROSEMIDE 40 MG PO TABS
40.0000 mg | ORAL_TABLET | Freq: Every day | ORAL | Status: DC
  Administered 2016-05-21 – 2016-05-28 (×8): 40 mg via ORAL
  Filled 2016-05-21: qty 1
  Filled 2016-05-21: qty 2
  Filled 2016-05-21 (×6): qty 1

## 2016-05-21 MED ORDER — ATORVASTATIN CALCIUM 40 MG PO TABS
40.0000 mg | ORAL_TABLET | Freq: Every day | ORAL | Status: DC
  Administered 2016-05-21 – 2016-05-27 (×7): 40 mg via ORAL
  Filled 2016-05-21 (×8): qty 1

## 2016-05-21 MED ORDER — LEVOFLOXACIN IN D5W 750 MG/150ML IV SOLN
750.0000 mg | INTRAVENOUS | Status: DC
  Administered 2016-05-22 – 2016-05-25 (×4): 750 mg via INTRAVENOUS
  Filled 2016-05-21 (×4): qty 150

## 2016-05-21 MED ORDER — ISOSORBIDE DINITRATE 10 MG PO TABS
20.0000 mg | ORAL_TABLET | Freq: Two times a day (BID) | ORAL | Status: DC
  Administered 2016-05-21 – 2016-05-28 (×15): 20 mg via ORAL
  Filled 2016-05-21: qty 1
  Filled 2016-05-21 (×13): qty 2
  Filled 2016-05-21: qty 1
  Filled 2016-05-21: qty 2

## 2016-05-21 MED ORDER — LISINOPRIL 20 MG PO TABS
20.0000 mg | ORAL_TABLET | Freq: Every day | ORAL | Status: DC
  Administered 2016-05-21 – 2016-05-28 (×8): 20 mg via ORAL
  Filled 2016-05-21 (×8): qty 1

## 2016-05-21 MED ORDER — CARVEDILOL PHOSPHATE ER 10 MG PO CP24
10.0000 mg | ORAL_CAPSULE | Freq: Every day | ORAL | Status: DC
  Administered 2016-05-21 – 2016-05-28 (×8): 10 mg via ORAL
  Filled 2016-05-21 (×8): qty 1

## 2016-05-21 MED ORDER — LEVOFLOXACIN IN D5W 750 MG/150ML IV SOLN
750.0000 mg | Freq: Once | INTRAVENOUS | Status: AC
  Administered 2016-05-21: 750 mg via INTRAVENOUS
  Filled 2016-05-21: qty 150

## 2016-05-21 MED ORDER — SODIUM CHLORIDE 0.9% FLUSH
3.0000 mL | Freq: Two times a day (BID) | INTRAVENOUS | Status: DC
  Administered 2016-05-21 – 2016-05-28 (×14): 3 mL via INTRAVENOUS

## 2016-05-21 MED ORDER — MAGNESIUM SULFATE 2 GM/50ML IV SOLN
2.0000 g | Freq: Once | INTRAVENOUS | Status: AC
  Administered 2016-05-21: 2 g via INTRAVENOUS
  Filled 2016-05-21: qty 50

## 2016-05-21 NOTE — ED Notes (Addendum)
CBG 138

## 2016-05-21 NOTE — ED Provider Notes (Signed)
[facility] DEPT Provider Note   CSN: [id number] Arrival date & time: 05/21/16  [id number]     History   Chief Complaint Chief Complaint  Patient presents with  . Shortness of Breath  . Chest Pain  . Cough    HPI [name] is a 56 y.o. female.  HPI  Patient has had sore throat cough shortness of breath and chest pain. 2-3 weeks ago diagnosed with strep on a positive strep test. Had been given antibiotics. States she was given a penicillin but I don't low-dose because she is allergic to penicillins. States she is feeling little better but the last few days she's had more shortness of breath and chest pain. Still coughing. Slight sore throat but states is improved. Some swelling in her leg and has history of CHF. No fevers. Slight dull headache. States her shoulders both her too. States that she aches all over.   Past Medical History:  Diagnosis Date  . Anginal pain ([place])    a. NL cath in 2008;  b. Myoview 03/2011: dec uptake along mid anterior wall on stress imaging -> ? attenuation vs. ischemia, EF 65%;  c. Echo 04/2011: EF 55-60%, no RWMA, Gr 2 dd  . Anxiety   . Asthma   . Bone cancer ([place])   . Cancer ([place])   . CHF (congestive heart failure) ([place])   . CHF (congestive heart failure) ([place])   . Depression   . Diabetes mellitus   . Hypertension   . 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 Obesity   . Pulmonary edema   . Pulmonary embolism ([place])    a. 2008 -> coumadin x 6 mos.  . Sleep apnea     Patient Active Problem List   Diagnosis Date Noted  . Hypersomnia with sleep apnea 11/18/2015  . Lethargy 11/18/2015  . Chest pain 09/30/2014  . Abnormal cardiac function test 09/29/2014  . Nausea with vomiting   . Chest pain with moderate risk for cardiac etiology 09/28/2014  . HTN (hypertension) 09/28/2014  . Hypokalemia   . Paresthesia 06/18/2014  . Numbness and tingling of left arm and leg   . Unstable angina ([place]) 05/24/2014  . OSA on CPAP  05/24/2014  . DM2 (diabetes mellitus, type 2) ([place]) 10/29/2012  . S/P laparoscopic appendectomy 06/04/2012  . Acute appendicitis with generalized peritonitis 06/04/2012  . Diastolic CHF ([place]) [id number]  . Mediastinal mass 01/09/2012  . Hypotension 01/08/2012  . DOE (dyspnea on exertion) 06/20/2011  . Mediastinal abnormality 06/20/2011  . Dyslipidemia 12/23/2009  . GLAUCOMA 12/23/2009  . ARTHRITIS 12/23/2009  . 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 Morbid obesity-BMI 45 09/13/2006  . ANXIETY STATE NOS 09/13/2006  . DISORDER, DEPRESSIVE NEC 09/13/2006  . CARPAL TUNNEL SYNDROME, MILD 09/13/2006  . Unspecified essential hypertension 09/13/2006  . IBS 09/13/2006  . DEGENERATION, LUMBAR/LUMBOSACRAL DISC 09/13/2006  . SYMPTOM, SWELLING/MASS/LUMP IN CHEST 09/13/2006  . PULMONARY EMBOLISM, HX OF 09/13/2006    Past Surgical History:  Procedure Laterality Date  . ABDOMINAL HYSTERECTOMY  2005  . CARDIAC CATHETERIZATION     Normal  . CARDIAC CATHETERIZATION N/A 09/30/2014   Procedure: Left Heart Cath and Coronary Angiography;  Surgeon: Sherren Mocha, MD;  Location: [facility];  Service: Cardiovascular;  Laterality: N/A;  . LAPAROSCOPIC APPENDECTOMY N/A 06/03/2012   Procedure: APPENDECTOMY LAPAROSCOPIC;  Surgeon: Stark Klein, MD;  Location: [facility];  Service: General;  Laterality: N/A;  . Left knee surgery  2008  .  LEG SURGERY    . TUBAL LIGATION  1989    OB History    No data available       Home Medications    Prior to Admission medications   Medication Sig Start Date End Date Taking? Authorizing Provider  albuterol (PROVENTIL,VENTOLIN) 90 MCG/ACT inhaler Inhale 2 puffs into the lungs every 4 (four) hours as needed for shortness of breath.    Yes Historical Provider, MD  aspirin 325 MG tablet Take 325 mg by mouth daily.   Yes Historical Provider, MD  atorvastatin (LIPITOR) 40 MG tablet Take 40 mg by mouth at bedtime.    Yes Historical Provider, MD  carvedilol (COREG CR) 10 MG 24  hr capsule Take 10 mg by mouth daily.   Yes Historical Provider, MD  cyclobenzaprine (FLEXERIL) 5 MG tablet Take 5 mg by mouth 2 (two) times daily.  12/16/15  Yes Historical Provider, MD  furosemide (LASIX) 40 MG tablet Take 40 mg by mouth daily.   Yes Ripudeep Krystal Eaton, MD  gabapentin (NEURONTIN) 300 MG capsule Take 2 capsules (600 mg total) by mouth 3 (three) times daily. 05/27/14  Yes Reyne Dumas, MD  isosorbide dinitrate (ISORDIL) 20 MG tablet Take 20 mg by mouth 2 (two) times daily.    Yes Historical Provider, MD  KLOR-CON M10 10 MEQ tablet Take 10 mEq by mouth 2 (two) times daily.  09/23/13  Yes Historical Provider, MD  KOMBIGLYZE XR 5-500 MG TB24 Take 1 tablet by mouth daily.  10/11/15  Yes Historical Provider, MD  lisinopril (PRINIVIL,ZESTRIL) 10 MG tablet Take 20 tablets by mouth daily.  09/21/14  Yes Historical Provider, MD  loratadine (CLARITIN) 10 MG tablet Take 10 mg by mouth daily.   Yes Historical Provider, MD  nitroGLYCERIN (NITROSTAT) 0.4 MG SL tablet Place 0.4 mg under the tongue every 5 (five) minutes as needed. For chest pain.   Yes Historical Provider, MD  pantoprazole (PROTONIX) 40 MG tablet Take 1 tablet (40 mg total) by mouth daily. 05/27/14  Yes Reyne Dumas, MD  sertraline (ZOLOFT) 100 MG tablet Take 100 mg by mouth daily.    Yes Historical Provider, MD    Family History Family History  Problem Relation Age of Onset  . Emphysema Mother   . Arthritis Mother   . Heart failure Mother     alive @ 30  . Asthma Brother   . Heart disease Father     died @ 64's.  . Stroke Father   . Diabetes Sister   . Heart disease Paternal Grandfather   . Colon cancer Maternal Grandfather     Social History Social History  Substance Use Topics  . Smoking status: Former Smoker    Packs/day: 0.50    Years: 15.00    Types: Cigarettes    Quit date: 04/24/1985  . Smokeless tobacco: Never Used  . Alcohol use No     Allergies   Hydrocodone; Penicillins; and Sulfa antibiotics   Review of  Systems Review of Systems  Constitutional: Positive for appetite change and fatigue. Negative for fever.  HENT: Negative for congestion.   Respiratory: Positive for cough and shortness of breath.   Cardiovascular: Positive for chest pain.  Gastrointestinal: Negative for abdominal pain.  Genitourinary: Negative for flank pain.  Musculoskeletal: Positive for myalgias.  Skin: Negative for wound.  Neurological: Negative for seizures.  Hematological: Negative for adenopathy.  Psychiatric/Behavioral: Negative for confusion.     Physical Exam Updated Vital Signs BP 133/73   Pulse 82  Temp 98.4 F (36.9 C) (Oral)   Resp 17   Ht 4\' 11"  (1.499 m)   SpO2 100%   Physical Exam  Constitutional: She appears well-developed.  HENT:  Head: Atraumatic.  Mouth/Throat: No oropharyngeal exudate.  Eyes: Pupils are equal, round, and reactive to light.  Neck: Neck supple. No JVD present.  Cardiovascular: Normal rate.   Pulmonary/Chest: Effort normal. No respiratory distress. She has no wheezes.  Abdominal: Soft. There is no tenderness.  Musculoskeletal: She exhibits edema.  Bilateral lower extremity pitting edema.  Neurological: She is alert.  Skin: Skin is warm. Capillary refill takes less than 2 seconds.  Psychiatric: She has a normal mood and affect.     ED Treatments / Results  Labs (all labs ordered are listed, but only abnormal results are displayed) Labs Reviewed  CBC WITH DIFFERENTIAL/PLATELET - Abnormal; Notable for the following:       Result Value   Hemoglobin 11.9 (*)    All other components within normal limits  TROPONIN I - Abnormal; Notable for the following:    Troponin I 0.05 (*)    All other components within normal limits  BASIC METABOLIC PANEL - Abnormal; Notable for the following:    Glucose, Bld 148 (*)    Calcium 8.8 (*)    All other components within normal limits    EKG  EKG Interpretation  Date/Time:  Sunday May 21 2016 07:13:23 EST Ventricular  Rate:  90 PR Interval:  172 QRS Duration: 104 QT Interval:  378 QTC Calculation: 462 R Axis:   84 Text Interpretation:  Normal sinus rhythm Septal infarct , age undetermined Abnormal ECG simmilar to some old tracings Confirmed by Alvino Chapel  MD, Diesel Lina [phone]) on 05/21/2016 7:41:57 AM       Radiology Dg Chest 2 View  Result Date: 05/21/2016 CLINICAL DATA:  Cough EXAM: CHEST  2 VIEW COMPARISON:  12/14/2015 FINDINGS: Interval development of patchy airspace disease right upper lobe, probable pneumonia. Negative for heart failure or effusion. Peribronchial thickening may reflect chronic lung disease. Heart size upper normal. IMPRESSION: Patchy right upper lobe airspace disease, possible pneumonia. Electronically Signed   By: Franchot Gallo M.D.   On: 05/21/2016 08:17    Procedures Procedures (including critical care time)  Medications Ordered in ED Medications  levofloxacin (LEVAQUIN) IVPB 750 mg (not administered)  ipratropium-albuterol (DUONEB) 0.5-2.5 (3) MG/3ML nebulizer solution 3 mL (3 mLs Nebulization Given 05/21/16 0912)     Initial Impression / Assessment and Plan / ED Course  I have reviewed the triage vital signs and the nursing notes.  Pertinent labs & imaging results that were available during my care of the patient were reviewed by me and considered in my medical decision making (see chart for details).      chest pain cough and feeling bad. Some shortness of breath. X-ray shows possible pneumonia. Had been treated on a medicine for the pneumonia but unknown what it was. Reported penicillin allergy. X-ray shows likely pneumonia. Troponin also minimally elevated at 0.05. EKG reassuring. Will admit  Final Clinical Impressions(s) / ED Diagnoses   Final diagnoses:  Community acquired pneumonia, unspecified laterality    New Prescriptions New Prescriptions   No medications on file     Davonna Belling, MD 05/21/16 [phone]

## 2016-05-21 NOTE — H&P (Signed)
History and Physical    [name] [id number] DOB: 1960-11-16 DOA: 05/21/2016  PCP: Velna Hatchet, MD  Patient coming from: home  Chief Complaint: chest pain, severe progressive SOB, cough with bloody tinged sputum  HPI: [name] is a 56 y.o. female with medical history significant of hypertension, diabetes mellitus, hyperlipidemia, grade 1 diastolic dysfunction presented to the emergency room with complaints of progressive shortness of breath, cough with bloody tinged sputum, chest pain to the back and both shoulders and abdominal pain.. Patient reported that approximately 2-3 weeks ago she was diagnosed with strep throat by positive red strep test. She had been given antibiotic the name of which she does not remember, but believes it was a lower dose penicillin because she is allergic to penicillin because she is allergic to penicillins.  Overall she was and feeling a little better but the last few days she's had more shortness of breath, which was progressively worsening, in addition she developed chest pain with pleuritic component,. She is still coughing and now the sputum has bloody tinge. Patient also c/o feeling lijke she was going to pass out on few occasions. She denied fevers, complained of  headache. Overall she feels like she had no one by a truck and stated stated that she aches all over.  ED Course: The emergency department she was afebrile, sinus are stable, with count normal, troponin was 0.05, EKG without acute abnormalities, normal sinus rhythm Chest x-ray revealed right upper lobe infiltrate Patient underwent echocardiogram and coronary angiography in June 2016. Echocardiography revealed EF of 55-60% and mild grade 1 diastolic dysfunction, coronary angiography revealed clean coronafries  Review of Systems: As per HPI otherwise 10 point review of systems negative.   Ambulatory Status: Independent  Past Medical History:  Diagnosis Date  . Anginal  pain ([place])    a. NL cath in 2008;  b. Myoview 03/2011: dec uptake along mid anterior wall on stress imaging -> ? attenuation vs. ischemia, EF 65%;  c. Echo 04/2011: EF 55-60%, no RWMA, Gr 2 dd  . Anxiety   . Asthma   . Bone cancer ([place])   . Cancer ([place])   . CHF (congestive heart failure) ([place])   . CHF (congestive heart failure) ([place])   . Depression   . Diabetes mellitus   . Hypertension   . 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 Obesity   . Pulmonary edema   . Pulmonary embolism ([place])    a. 2008 -> coumadin x 6 mos.  . Sleep apnea     Past Surgical History:  Procedure Laterality Date  . ABDOMINAL HYSTERECTOMY  2005  . CARDIAC CATHETERIZATION     Normal  . CARDIAC CATHETERIZATION N/A 09/30/2014   Procedure: Left Heart Cath and Coronary Angiography;  Surgeon: Sherren Mocha, MD;  Location: [facility];  Service: Cardiovascular;  Laterality: N/A;  . LAPAROSCOPIC APPENDECTOMY N/A 06/03/2012   Procedure: APPENDECTOMY LAPAROSCOPIC;  Surgeon: Stark Klein, MD;  Location: [facility];  Service: General;  Laterality: N/A;  . Left knee surgery  2008  . LEG SURGERY    . TUBAL LIGATION  1989    Social History   Social History  . Marital status: Legally Separated    Spouse name: N/A  . Number of children: N/A  . Years of education: N/A   Occupational History  . UNEMPLOYED Disabled   Social History Main Topics  . Smoking status: Former Smoker  Packs/day: 0.50    Years: 15.00    Types: Cigarettes    Quit date: 04/24/1985  . Smokeless tobacco: Never Used  . Alcohol use No  . Drug use: No  . Sexual activity: No   Other Topics Concern  . Not on file   Social History Narrative   Lives in [place] by herself.  Disabled.  Caffeine 1 cup avg daily.  3 kids, 6 grandkids.      Allergies  Allergen Reactions  . Hydrocodone Other (See Comments)    Causes Hallucinations  . Penicillins Swelling    Has patient had a PCN reaction causing immediate rash,  facial/tongue/throat swelling, SOB or lightheadedness with hypotension: YES Has patient had a PCN reaction causing severe rash involving mucus membranes or skin necrosis: NO Has patient had a PCN reaction that required hospitalization NO Has patient had a PCN reaction occurring within the last 10 years: NO If all of the above answers are "NO", then may proceed with Cephalosporin use.  . Sulfa Antibiotics Diarrhea, Itching and Rash    Family History  Problem Relation Age of Onset  . Emphysema Mother   . Arthritis Mother   . Heart failure Mother     alive @ 57  . Asthma Brother   . Heart disease Father     died @ 73's.  . Stroke Father   . Diabetes Sister   . Heart disease Paternal Grandfather   . Colon cancer Maternal Grandfather     Prior to Admission medications   Medication Sig Start Date End Date Taking? Authorizing Provider  albuterol (PROVENTIL,VENTOLIN) 90 MCG/ACT inhaler Inhale 2 puffs into the lungs every 4 (four) hours as needed for shortness of breath.    Yes Historical Provider, MD  aspirin 325 MG tablet Take 325 mg by mouth daily.   Yes Historical Provider, MD  atorvastatin (LIPITOR) 40 MG tablet Take 40 mg by mouth at bedtime.    Yes Historical Provider, MD  carvedilol (COREG CR) 10 MG 24 hr capsule Take 10 mg by mouth daily.   Yes Historical Provider, MD  cyclobenzaprine (FLEXERIL) 5 MG tablet Take 5 mg by mouth 2 (two) times daily.  12/16/15  Yes Historical Provider, MD  furosemide (LASIX) 40 MG tablet Take 40 mg by mouth daily.   Yes Ripudeep Krystal Eaton, MD  gabapentin (NEURONTIN) 300 MG capsule Take 2 capsules (600 mg total) by mouth 3 (three) times daily. 05/27/14  Yes Reyne Dumas, MD  isosorbide dinitrate (ISORDIL) 20 MG tablet Take 20 mg by mouth 2 (two) times daily.    Yes Historical Provider, MD  KLOR-CON M10 10 MEQ tablet Take 10 mEq by mouth 2 (two) times daily.  09/23/13  Yes Historical Provider, MD  KOMBIGLYZE XR 5-500 MG TB24 Take 1 tablet by mouth daily.  10/11/15   Yes Historical Provider, MD  lisinopril (PRINIVIL,ZESTRIL) 10 MG tablet Take 20 tablets by mouth daily.  09/21/14  Yes Historical Provider, MD  loratadine (CLARITIN) 10 MG tablet Take 10 mg by mouth daily.   Yes Historical Provider, MD  nitroGLYCERIN (NITROSTAT) 0.4 MG SL tablet Place 0.4 mg under the tongue every 5 (five) minutes as needed. For chest pain.   Yes Historical Provider, MD  pantoprazole (PROTONIX) 40 MG tablet Take 1 tablet (40 mg total) by mouth daily. 05/27/14  Yes Reyne Dumas, MD  sertraline (ZOLOFT) 100 MG tablet Take 100 mg by mouth daily.    Yes Historical Provider, MD    Physical Exam: Vitals:  05/21/16 0710 05/21/16 0712 05/21/16 0915  BP:  155/74 133/73  Pulse:  92 82  Resp:  19 17  Temp:  98.4 F (36.9 C)   TempSrc:  Oral   SpO2:  97% 100%  Height: 4\' 11"  (1.499 m)       General: AppeIn moderate respiratory distress due to severe shortness of breath and wheezing PERRLA, EOMI, normal lids, iris ENT:  grossly normal hearing, lips & tongue, mucous membranes moist and intact Neck: no lymphoadenopathy, masses or thyromegaly Cardiovascular: RRR, no m/r/g. No JVD, carotid bruits. No LE edema.  Respiratory: Diffuse scattered expiratory wheezes bilaterally,  discrete right-sided cracles. Normal respiratory effort. No accessory muscle use observed Abdomen: soft, tender to palpation in the upper half diffusely, non-distended, no organomegaly or masses appreciated. BS present in all quadrants Skin: no rash, ulcers or induration seen on limited exam Musculoskeletal: grossly normal tone BUE/BLE, good ROM, no bony abnormality or joint deformities observed Psychiatric: grossly normal mood and affect, speech fluent and appropriate, alert and oriented x3 Neurologic: CN II-XII grossly intact, moves all extremities in coordinated fashion, sensation intact  Labs on Admission: I have personally reviewed following labs and imaging studies  CBC, BMP  GFR: CrCl cannot be  calculated (Unknown ideal weight.).   Creatinine Clearance: CrCl cannot be calculated (Unknown ideal weight.).     Radiological Exams on Admission: Dg Chest 2 View  Result Date: 05/21/2016 CLINICAL DATA:  Cough EXAM: CHEST  2 VIEW COMPARISON:  12/14/2015 FINDINGS: Interval development of patchy airspace disease right upper lobe, probable pneumonia. Negative for heart failure or effusion. Peribronchial thickening may reflect chronic lung disease. Heart size upper normal. IMPRESSION: Patchy right upper lobe airspace disease, possible pneumonia. Electronically Signed   By: Franchot Gallo M.D.   On: 05/21/2016 08:17    EKG: Independently reviewed - normal sinus rhythm no acute abnormalities observed  Assessment/Plan Active Problems:   CAP (community acquired pneumonia)   CAP Continue IV Levaquin, nebulizers scheduled and when necessary, guaifenesin Urine Legionella antigen PCR and strep pneumonia antige PCR are pending  Influenza A PCR pending Continue to monitor her fevers and white blood cells count  Chest pain with mildly elevated troponin  Continue cycle cardiac enzymes, check BNP  Given progressive shortness of breath, bloody tinged sputum and feeling of near-syncopal on a few occasions, will proceed with seed with CT to rule out PE    Hypertension - currently stable Continue home medication and adjust the doses if needed depending on the BP readings  DM type II - most recent HgbA1C is 6.2% on 2014; recheck Hold oral anti-glycemic while hospitalized Continue diabetic diet, monitor FSBS QACHS, add sliding scale insulin  Hyperlipidemia Continue statin therapy  DVT prophylaxis: Lovenox  Code Status: Full  Family Communication: none Disposition Plan: Telemetry Consults called: none Admission status: observation    York Grice, PA-C Pager: [phone] [facility]  If 7PM-7AM, please contact night-coverage [email or web address] Password [facility]  05/21/2016, 9:56 AM

## 2016-05-21 NOTE — ED Notes (Signed)
Daughter took pt to restroom via wheelchair

## 2016-05-21 NOTE — ED Triage Notes (Signed)
Pt. Stated, I had strep throat, cough for 2 weeks, now I have  SOB , chest pain and still have that cough.

## 2016-05-21 NOTE — ED Notes (Signed)
Patient transported to CT 

## 2016-05-22 ENCOUNTER — Observation Stay (HOSPITAL_BASED_OUTPATIENT_CLINIC_OR_DEPARTMENT_OTHER): Payer: Medicare Other

## 2016-05-22 DIAGNOSIS — R079 Chest pain, unspecified: Secondary | ICD-10-CM

## 2016-05-22 DIAGNOSIS — R0781 Pleurodynia: Secondary | ICD-10-CM | POA: Diagnosis not present

## 2016-05-22 DIAGNOSIS — E119 Type 2 diabetes mellitus without complications: Secondary | ICD-10-CM | POA: Diagnosis not present

## 2016-05-22 DIAGNOSIS — E041 Nontoxic single thyroid nodule: Secondary | ICD-10-CM | POA: Diagnosis present

## 2016-05-22 DIAGNOSIS — E785 Hyperlipidemia, unspecified: Secondary | ICD-10-CM | POA: Diagnosis present

## 2016-05-22 DIAGNOSIS — T380X5A Adverse effect of glucocorticoids and synthetic analogues, initial encounter: Secondary | ICD-10-CM | POA: Diagnosis present

## 2016-05-22 DIAGNOSIS — R748 Abnormal levels of other serum enzymes: Secondary | ICD-10-CM | POA: Diagnosis not present

## 2016-05-22 DIAGNOSIS — F3289 Other specified depressive episodes: Secondary | ICD-10-CM | POA: Diagnosis not present

## 2016-05-22 DIAGNOSIS — F329 Major depressive disorder, single episode, unspecified: Secondary | ICD-10-CM | POA: Diagnosis present

## 2016-05-22 DIAGNOSIS — Z79899 Other long term (current) drug therapy: Secondary | ICD-10-CM | POA: Diagnosis not present

## 2016-05-22 DIAGNOSIS — F419 Anxiety disorder, unspecified: Secondary | ICD-10-CM | POA: Diagnosis present

## 2016-05-22 DIAGNOSIS — Z85830 Personal history of malignant neoplasm of bone: Secondary | ICD-10-CM | POA: Diagnosis not present

## 2016-05-22 DIAGNOSIS — G4730 Sleep apnea, unspecified: Secondary | ICD-10-CM | POA: Diagnosis present

## 2016-05-22 DIAGNOSIS — E1140 Type 2 diabetes mellitus with diabetic neuropathy, unspecified: Secondary | ICD-10-CM | POA: Diagnosis present

## 2016-05-22 DIAGNOSIS — R0602 Shortness of breath: Secondary | ICD-10-CM | POA: Diagnosis present

## 2016-05-22 DIAGNOSIS — Z87891 Personal history of nicotine dependence: Secondary | ICD-10-CM | POA: Diagnosis not present

## 2016-05-22 DIAGNOSIS — Z7982 Long term (current) use of aspirin: Secondary | ICD-10-CM | POA: Diagnosis not present

## 2016-05-22 DIAGNOSIS — I248 Other forms of acute ischemic heart disease: Secondary | ICD-10-CM | POA: Diagnosis present

## 2016-05-22 DIAGNOSIS — Z6841 Body Mass Index (BMI) 40.0 and over, adult: Secondary | ICD-10-CM | POA: Diagnosis not present

## 2016-05-22 DIAGNOSIS — J9601 Acute respiratory failure with hypoxia: Secondary | ICD-10-CM | POA: Diagnosis present

## 2016-05-22 DIAGNOSIS — R062 Wheezing: Secondary | ICD-10-CM | POA: Diagnosis not present

## 2016-05-22 DIAGNOSIS — I10 Essential (primary) hypertension: Secondary | ICD-10-CM | POA: Diagnosis not present

## 2016-05-22 DIAGNOSIS — J181 Lobar pneumonia, unspecified organism: Secondary | ICD-10-CM | POA: Diagnosis present

## 2016-05-22 DIAGNOSIS — Z880 Allergy status to penicillin: Secondary | ICD-10-CM | POA: Diagnosis not present

## 2016-05-22 DIAGNOSIS — J189 Pneumonia, unspecified organism: Secondary | ICD-10-CM | POA: Diagnosis present

## 2016-05-22 DIAGNOSIS — I5030 Unspecified diastolic (congestive) heart failure: Secondary | ICD-10-CM | POA: Diagnosis not present

## 2016-05-22 DIAGNOSIS — E1169 Type 2 diabetes mellitus with other specified complication: Secondary | ICD-10-CM | POA: Diagnosis not present

## 2016-05-22 DIAGNOSIS — Z794 Long term (current) use of insulin: Secondary | ICD-10-CM | POA: Diagnosis not present

## 2016-05-22 DIAGNOSIS — Z833 Family history of diabetes mellitus: Secondary | ICD-10-CM | POA: Diagnosis not present

## 2016-05-22 DIAGNOSIS — Z86711 Personal history of pulmonary embolism: Secondary | ICD-10-CM | POA: Diagnosis not present

## 2016-05-22 DIAGNOSIS — R110 Nausea: Secondary | ICD-10-CM | POA: Diagnosis present

## 2016-05-22 DIAGNOSIS — I5032 Chronic diastolic (congestive) heart failure: Secondary | ICD-10-CM | POA: Diagnosis present

## 2016-05-22 DIAGNOSIS — I110 Hypertensive heart disease with heart failure: Secondary | ICD-10-CM | POA: Diagnosis present

## 2016-05-22 DIAGNOSIS — E1165 Type 2 diabetes mellitus with hyperglycemia: Secondary | ICD-10-CM

## 2016-05-22 LAB — ECHOCARDIOGRAM COMPLETE: Height: 60 in

## 2016-05-22 LAB — HEPATIC FUNCTION PANEL
ALT: 32 U/L (ref 14–54)
AST: 28 U/L (ref 15–41)
Albumin: 3.3 g/dL — ABNORMAL LOW (ref 3.5–5.0)
Alkaline Phosphatase: 62 U/L (ref 38–126)
Bilirubin, Direct: 0.1 mg/dL (ref 0.1–0.5)
Indirect Bilirubin: 0.6 mg/dL (ref 0.3–0.9)
Total Bilirubin: 0.7 mg/dL (ref 0.3–1.2)
Total Protein: 6.4 g/dL — ABNORMAL LOW (ref 6.5–8.1)

## 2016-05-22 LAB — BASIC METABOLIC PANEL
Anion gap: 8 (ref 5–15)
BUN: 10 mg/dL (ref 6–20)
CO2: 26 mmol/L (ref 22–32)
Calcium: 8.9 mg/dL (ref 8.9–10.3)
Chloride: 105 mmol/L (ref 101–111)
Creatinine, Ser: 0.98 mg/dL (ref 0.44–1.00)
GFR calc Af Amer: >60 mL/min (ref >60)
GFR calc non Af Amer: >60 mL/min (ref >60)
Glucose, Bld: 236 mg/dL — ABNORMAL HIGH (ref 65–99)
Potassium: 3.4 mmol/L — ABNORMAL LOW (ref 3.5–5.1)
Sodium: 139 mmol/L (ref 135–145)

## 2016-05-22 LAB — GLUCOSE, CAPILLARY
Glucose-Capillary: 227 mg/dL — ABNORMAL HIGH (ref 65–99)
Glucose-Capillary: 274 mg/dL — ABNORMAL HIGH (ref 65–99)
Glucose-Capillary: 270 mg/dL — ABNORMAL HIGH (ref 65–99)

## 2016-05-22 LAB — CBC
HCT: 37.2 % (ref 36.0–46.0)
Hemoglobin: 12 g/dL (ref 12.0–15.0)
MCH: 28 pg (ref 26.0–34.0)
MCHC: 32.3 g/dL (ref 30.0–36.0)
MCV: 86.7 fL (ref 78.0–100.0)
Platelets: 209 10*3/uL (ref 150–400)
RBC: 4.29 MIL/uL (ref 3.87–5.11)
RDW: 14 % (ref 11.5–15.5)
WBC: 4.6 10*3/uL (ref 4.0–10.5)

## 2016-05-22 LAB — TROPONIN I: Troponin I: 0.05 ng/mL (ref <0.03)

## 2016-05-22 LAB — LIPASE, BLOOD: Lipase: 27 U/L (ref 11–51)

## 2016-05-22 LAB — HIV ANTIBODY (ROUTINE TESTING W REFLEX): HIV Screen 4th Generation wRfx: NONREACTIVE

## 2016-05-22 LAB — HEMOGLOBIN A1C
Hgb A1c MFr Bld: 6.6 % — ABNORMAL HIGH (ref 4.8–5.6)
Mean Plasma Glucose: 143 mg/dL

## 2016-05-22 MED ORDER — GUAIFENESIN ER 600 MG PO TB12
1200.0000 mg | ORAL_TABLET | Freq: Two times a day (BID) | ORAL | Status: DC
  Administered 2016-05-22 – 2016-05-28 (×13): 1200 mg via ORAL
  Filled 2016-05-22 (×13): qty 2

## 2016-05-22 MED ORDER — FLUTICASONE PROPIONATE 50 MCG/ACT NA SUSP
1.0000 | Freq: Every day | NASAL | Status: DC
  Administered 2016-05-22 – 2016-05-28 (×7): 1 via NASAL
  Filled 2016-05-22: qty 16

## 2016-05-22 MED ORDER — INSULIN GLARGINE 100 UNIT/ML ~~LOC~~ SOLN
8.0000 [IU] | Freq: Every day | SUBCUTANEOUS | Status: DC
  Administered 2016-05-22: 8 [IU] via SUBCUTANEOUS
  Filled 2016-05-22 (×2): qty 0.08

## 2016-05-22 MED ORDER — POTASSIUM CHLORIDE CRYS ER 20 MEQ PO TBCR
40.0000 meq | EXTENDED_RELEASE_TABLET | Freq: Once | ORAL | Status: AC
  Administered 2016-05-22: 40 meq via ORAL
  Filled 2016-05-22: qty 2

## 2016-05-22 NOTE — Progress Notes (Addendum)
[facility] PROGRESS NOTE  KRISTIE ZESIGER [id number] DOB: 1960-12-05 DOA: 05/21/2016  PCP: Velna Hatchet, MD  Brief History/Interval Summary: 56 year old African-American female with a past medical history of hypertension, diabetes, hyperlipidemia, diastolic dysfunction who is morbidly obese, presented with shortness of breath, cough, right-sided chest pain, myalgias. She was found to have an infiltrate in the right lung. She was hospitalized for further management.  Reason for Visit: Community acquired pneumonia with pleuritic chest pain  Consultants: None  Procedures: Echocardiogram is pending  Antibiotics: Levaquin  Subjective/Interval History: Patient continues to have a cough with some yellowish expectoration which is blood-tinged. She also mentions pain in the right side of her chest with coughing. Episodes of dry heaves.  ROS: Denies any abdominal pain  Objective:  Vital Signs  Vitals:   05/21/16 2139 05/22/16 0100 05/22/16 0452 05/22/16 0924  BP:   (!) 156/72 110/60  Pulse:  84 84 95  Resp:  20 18 18   Temp:   98.3 F (36.8 C) 97.9 F (36.6 C)  TempSrc:   Oral Oral  SpO2: 96% 96% 94% 94%  Height:        Intake/Output Summary (Last 24 hours) at 05/22/16 1234 Last data filed at 05/22/16 0930  Gross per 24 hour  Intake              820 ml  Output                0 ml  Net              820 ml   There were no vitals filed for this visit.  General appearance: alert, cooperative, appears stated age and no distress Resp: Crackles and rhonchi heard in the right upper chest. Few wheezes also appreciated. Normal effort. No use of accessory muscles. Cardio: regular rate and rhythm, S1, S2 normal, no murmur, click, rub or gallop GI: Abdomen is mildly tender diffusely. No rebound, rigidity or guarding. No masses or organomegaly. Bowel sounds present. Extremities: extremities normal, atraumatic, no cyanosis or edema Neurologic: Awake and alert. Oriented  3. No focal neurological deficits.  Lab Results:  Data Reviewed: I have personally reviewed following labs and imaging studies  CBC:  Recent Labs Lab 05/21/16 0800 05/21/16 1020 05/22/16 0358  WBC 6.0 5.5 4.6  NEUTROABS 1.7  --   --   HGB 11.9* 11.3* 12.0  HCT 37.0 35.7* 37.2  MCV 86.7 87.3 86.7  PLT 185 185 [id number]    Basic Metabolic Panel:  Recent Labs Lab 05/21/16 0800 05/21/16 1020 05/22/16 0358  NA 142  --  139  K 3.6  --  3.4*  CL 107  --  105  CO2 27  --  26  GLUCOSE 148*  --  236*  BUN 8  --  10  CREATININE 0.90 0.84 0.98  CALCIUM 8.8*  --  8.9  MG  --  1.9  --     GFR: CrCl cannot be calculated (Unknown ideal weight.).  Liver Function Tests:  Recent Labs Lab 05/22/16 0913  AST 28  ALT 32  ALKPHOS 62  BILITOT 0.7  PROT 6.4*  ALBUMIN 3.3*     Recent Labs Lab 05/22/16 0913  LIPASE 27   Cardiac Enzymes:  Recent Labs Lab 05/21/16 0800 05/21/16 1020 05/21/16 1756 05/21/16 2331  TROPONINI 0.05* 0.05* 0.05* 0.05*    HbA1C:  Recent Labs  05/21/16 1132  HGBA1C 6.6*    CBG:  Recent Labs Lab 05/21/16 1211 05/21/16  1717 05/21/16 2123 05/22/16 0745 05/22/16 1145  GLUCAP 138* 240* 238* 227* 274*    Radiology Studies: Dg Chest 2 View  Result Date: 05/21/2016 CLINICAL DATA:  Cough EXAM: CHEST  2 VIEW COMPARISON:  12/14/2015 FINDINGS: Interval development of patchy airspace disease right upper lobe, probable pneumonia. Negative for heart failure or effusion. Peribronchial thickening may reflect chronic lung disease. Heart size upper normal. IMPRESSION: Patchy right upper lobe airspace disease, possible pneumonia. Electronically Signed   By: Franchot Gallo M.D.   On: 05/21/2016 08:17   Ct Angio Chest Pe W Or Wo Contrast  Result Date: 05/21/2016 CLINICAL DATA:  Cough, chest pain, shortness of Breath EXAM: CT ANGIOGRAPHY CHEST WITH CONTRAST TECHNIQUE: Multidetector CT imaging of the chest was performed using the standard protocol  during bolus administration of intravenous contrast. Multiplanar CT image reconstructions and MIPs were obtained to evaluate the vascular anatomy. CONTRAST:  80 cc Isovue 370 IV COMPARISON:  05/24/2014 FINDINGS: Cardiovascular: No filling defects in the pulmonary arteries to suggest pulmonary emboli. Insert Heart Mediastinum/Nodes: No mediastinal, hilar, or axillary adenopathy. Thyroid is enlarged with probable 2.8 cm nodule in the right thyroid lobe. Findings similar to prior study. Lungs/Pleura: Airspace disease in the right upper lobe compatible with pneumonia. Lungs otherwise clear. No effusions. Upper Abdomen: Imaging into the upper abdomen shows no acute findings. Musculoskeletal: Chest wall soft tissues are unremarkable. No acute bony abnormality. Review of the MIP images confirms the above findings. IMPRESSION: Right upper lobe consolidation compatible with pneumonia. No evidence of pulmonary embolus. Enlarged thyroid with probable right thyroid lobe nodule, suggesting multinodular goiter, not significantly changed since prior CT. This could be further evaluated with elective thyroid ultrasound. Electronically Signed   By: Rolm Baptise M.D.   On: 05/21/2016 12:00     Medications:  Scheduled: . aspirin  325 mg Oral Daily  . atorvastatin  40 mg Oral QHS  . carvedilol  10 mg Oral Daily  . cyclobenzaprine  5 mg Oral BID  . enoxaparin (LOVENOX) injection  40 mg Subcutaneous Q24H  . fluticasone  1 spray Each Nare Daily  . furosemide  40 mg Oral Daily  . gabapentin  600 mg Oral TID  . guaiFENesin  1,200 mg Oral BID  . insulin aspart  0-9 Units Subcutaneous TID WC  . insulin glargine  8 Units Subcutaneous Daily  . ipratropium-albuterol  3 mL Nebulization QID  . isosorbide dinitrate  20 mg Oral BID  . levofloxacin (LEVAQUIN) IV  750 mg Intravenous Q24H  . lisinopril  20 mg Oral Daily  . methylPREDNISolone (SOLU-MEDROL) injection  60 mg Intravenous Q12H  . pantoprazole  40 mg Oral Daily  .  sertraline  100 mg Oral Daily  . sodium chloride flush  3 mL Intravenous Q12H   Continuous:  [id number] **OR** acetaminophen, ipratropium-albuterol, nitroGLYCERIN, ondansetron **OR** ondansetron (ZOFRAN) IV, polyethylene glycol, traZODone  Assessment/Plan:  Active Problems:   Community acquired pneumonia    Community-acquired pneumonia CT scan did not show any pulmonary embolism. Right sided infiltrate was noted. Continue with IV Levaquin. Influenza PCR negative. Symptomatic treatment. She does have rhonchi and wheezing. Continue nebulizer treatments and steroids.   Pleuritic Chest pain with mildly elevated troponin  Patient has right-sided chest pain. She did have a mildly elevated troponin. CT scan does not show any PE. Chest pain is most likely due to pneumonia. Mildly elevated troponin could be suggestive of demand ischemia. Patient had a cardiac catheterization in 2016, which did not show any coronary artery  disease. Unlikely this is cardiac in etiology. We will, however, proceed with an echocardiogram. EKG showed nonspecific ST and T wave changes. Some of these seen previously as well. Will repeat.  Nausea and dry heaves Possibly due to coughing episodes. LFTs are normal. Lipase is normal. Continue to monitor for now.  Essential Hypertension Currently stable. Continue home medication and adjust the doses if needed depending on the BP readings.  DM type II  Most recent HgbA1C is 6.2% on 2014. Repeated and is pending. Holding her oral agents. Sliding scale coverage. Lantus. Elevated CBG likely due to steroids.  Hyperlipidemia Continue statin therapy  DVT Prophylaxis: Lovenox    Code Status: Full code  Family Communication: Discussed with the patient  Disposition Plan: Management as outlined above. Mobilize.    LOS: 0 days   [facility] Pager [phone] 05/22/2016, 12:34 PM  If 7PM-7AM, please contact night-coverage at [email or web address],  password [facility]

## 2016-05-22 NOTE — Progress Notes (Signed)
  Echocardiogram 2D Echocardiogram has been performed.  JIMMA, ROZELLE 05/22/2016, 1:54 PM

## 2016-05-22 NOTE — Care Management Obs Status (Signed)
[facility] NOTIFICATION   Patient Details  Name: [name] MRN: [id number] Date of Birth: February 13, 1961   Medicare Observation Status Notification Given:  Yes    Ellias Mcelreath, Rory Percy, RN 05/22/2016, 4:46 PM

## 2016-05-22 NOTE — Progress Notes (Signed)
Pt states she does not want to wear her CPAP tonight because she woke up coughing and threw up last night.

## 2016-05-23 DIAGNOSIS — R062 Wheezing: Secondary | ICD-10-CM

## 2016-05-23 DIAGNOSIS — E041 Nontoxic single thyroid nodule: Secondary | ICD-10-CM

## 2016-05-23 LAB — COMPREHENSIVE METABOLIC PANEL
ALT: 26 U/L (ref 14–54)
AST: 22 U/L (ref 15–41)
Albumin: 3.2 g/dL — ABNORMAL LOW (ref 3.5–5.0)
Alkaline Phosphatase: 56 U/L (ref 38–126)
Anion gap: 5 (ref 5–15)
BUN: 15 mg/dL (ref 6–20)
CO2: 28 mmol/L (ref 22–32)
Calcium: 8.9 mg/dL (ref 8.9–10.3)
Chloride: 106 mmol/L (ref 101–111)
Creatinine, Ser: 0.96 mg/dL (ref 0.44–1.00)
GFR calc Af Amer: >60 mL/min (ref >60)
GFR calc non Af Amer: >60 mL/min (ref >60)
Glucose, Bld: 311 mg/dL — ABNORMAL HIGH (ref 65–99)
Potassium: 4.4 mmol/L (ref 3.5–5.1)
Sodium: 139 mmol/L (ref 135–145)
Total Bilirubin: 0.6 mg/dL (ref 0.3–1.2)
Total Protein: 6.1 g/dL — ABNORMAL LOW (ref 6.5–8.1)

## 2016-05-23 LAB — CBC
HCT: 37.5 % (ref 36.0–46.0)
Hemoglobin: 11.7 g/dL — ABNORMAL LOW (ref 12.0–15.0)
MCH: 27.5 pg (ref 26.0–34.0)
MCHC: 31.2 g/dL (ref 30.0–36.0)
MCV: 88.2 fL (ref 78.0–100.0)
Platelets: 223 10*3/uL (ref 150–400)
RBC: 4.25 MIL/uL (ref 3.87–5.11)
RDW: 14.3 % (ref 11.5–15.5)
WBC: 9.8 10*3/uL (ref 4.0–10.5)

## 2016-05-23 LAB — GLUCOSE, CAPILLARY
Glucose-Capillary: 265 mg/dL — ABNORMAL HIGH (ref 65–99)
Glucose-Capillary: 242 mg/dL — ABNORMAL HIGH (ref 65–99)
Glucose-Capillary: 314 mg/dL — ABNORMAL HIGH (ref 65–99)

## 2016-05-23 MED ORDER — METHYLPREDNISOLONE SODIUM SUCC 125 MG IJ SOLR
60.0000 mg | Freq: Four times a day (QID) | INTRAMUSCULAR | Status: DC
  Administered 2016-05-23 – 2016-05-25 (×8): 60 mg via INTRAVENOUS
  Filled 2016-05-23 (×8): qty 2

## 2016-05-23 MED ORDER — BENZONATATE 100 MG PO CAPS
200.0000 mg | ORAL_CAPSULE | Freq: Three times a day (TID) | ORAL | Status: DC | PRN
  Administered 2016-05-23 – 2016-05-27 (×3): 200 mg via ORAL
  Filled 2016-05-23 (×3): qty 2

## 2016-05-23 MED ORDER — BUDESONIDE 0.25 MG/2ML IN SUSP
0.2500 mg | Freq: Two times a day (BID) | RESPIRATORY_TRACT | Status: DC
Start: 2016-05-23 — End: 2016-05-28
  Administered 2016-05-23 – 2016-05-28 (×10): 0.25 mg via RESPIRATORY_TRACT
  Filled 2016-05-23 (×11): qty 2

## 2016-05-23 MED ORDER — GUAIFENESIN-DM 100-10 MG/5ML PO SYRP
5.0000 mL | ORAL_SOLUTION | ORAL | Status: DC | PRN
  Administered 2016-05-26 – 2016-05-28 (×5): 5 mL via ORAL
  Filled 2016-05-23 (×5): qty 5

## 2016-05-23 MED ORDER — ACETYLCYSTEINE 20 % IN SOLN
3.0000 mL | Freq: Three times a day (TID) | RESPIRATORY_TRACT | Status: AC
  Administered 2016-05-23 (×2): 3 mL via RESPIRATORY_TRACT
  Filled 2016-05-23 (×3): qty 4

## 2016-05-23 MED ORDER — INSULIN GLARGINE 100 UNIT/ML ~~LOC~~ SOLN
15.0000 [IU] | Freq: Every day | SUBCUTANEOUS | Status: DC
  Administered 2016-05-23 – 2016-05-26 (×4): 15 [IU] via SUBCUTANEOUS
  Filled 2016-05-23 (×4): qty 0.15

## 2016-05-23 NOTE — Progress Notes (Signed)
Inpatient Diabetes Program Recommendations  AACE/ADA: New Consensus Statement on Inpatient Glycemic Control (2015)  Target Ranges:  Prepandial:   less than 140 mg/dL      Peak postprandial:   less than 180 mg/dL (1-2 hours)      Critically ill patients:  140 - 180 mg/dL   Results for [name], [name] (MRN [id number]) as of 05/23/2016 12:59  Ref. Range 05/22/2016 07:45 05/22/2016 11:45 05/22/2016 16:10  Glucose-Capillary Latest Ref Range: 65 - 99 mg/dL 227 (H) 274 (H) 270 (H)   Results for [name], [name] (MRN [id number]) as of 05/23/2016 12:59  Ref. Range 05/23/2016 07:42 05/23/2016 12:39  Glucose-Capillary Latest Ref Range: 65 - 99 mg/dL 265 (H) 242 (H)    Home DM Meds: Kombiglyze 5/500 mg daily  Current Insulin Orders: Lantus 15 daily        Novolog Sensitive Correction Scale/ SSI (0-9 units) TID AC      MD- Note Lantus dose increased to 15 units daily today.  Patient getting Solumedrol 60 mg Q6 hours.  MD- Please also consider starting Novolog Meal Coverage while patient getting IV steroids:  Novolog 4 units TID with meals (hold if pt eats <50% of meal)     --Will follow patient during hospitalization--  Wyn Quaker RN, MSN, CDE Diabetes Coordinator Inpatient Glycemic Control Team Team Pager: [phone] (8a-5p)

## 2016-05-23 NOTE — Progress Notes (Signed)
[facility] PROGRESS NOTE  [name] [id number] DOB: Dec 23, 1960 DOA: 05/21/2016  PCP: Velna Hatchet, MD  Brief History/Interval Summary: 56 year old African-American female with a past medical history of hypertension, diabetes, hyperlipidemia, diastolic dysfunction who is morbidly obese, presented with shortness of breath, cough, right-sided chest pain, myalgias. She was found to have an infiltrate in the right lung. She was hospitalized for further management.  Reason for Visit: Community acquired pneumonia with pleuritic chest pain  Consultants: None  Procedures:  Echocardiogram Study Conclusions  - Left ventricle: The cavity size was normal. Wall thickness was   normal. Systolic function was normal. The estimated ejection   fraction was in the range of 55% to 60%. Although no diagnostic   regional wall motion abnormality was identified, this possibility   cannot be completely excluded on the basis of this study.   Features are consistent with a pseudonormal left ventricular   filling pattern, with concomitant abnormal relaxation and   increased filling pressure (grade 2 diastolic dysfunction). - Aortic valve: There was no stenosis. - Mitral valve: There was no significant regurgitation. - Right ventricle: The cavity size was normal. Systolic function   was normal. - Pulmonary arteries: No complete TR doppler jet so unable to   estimate PA systolic pressure. - Systemic veins: IVC not visualized.  Impressions:  - Normal LV size with EF 55-60%. Moderate diastolic dysfunction.   Normal RV size and systolic function. No significant valvular   abnormalities.  Antibiotics: Levaquin  Subjective/Interval History: Patient states that she continues to have wheezing. Continues to have a cough with only minimal expectoration. Continues to have some pain in the right side of her chest. Episodes of dry heaves.  ROS: Denies any abdominal  pain  Objective:  Vital Signs  Vitals:   05/22/16 2021 05/22/16 2223 05/23/16 0631 05/23/16 0918  BP:  (!) 127/92 (!) 149/73 (!) 108/49  Pulse:  88 81 94  Resp:  (!) 23 (!) 22 20  Temp:  98 F (36.7 C) 98 F (36.7 C) 98.2 F (36.8 C)  TempSrc:    Oral  SpO2: 98% 90% 92% 95%  Weight:  106 kg (233 lb 11 oz)    Height:        Intake/Output Summary (Last 24 hours) at 05/23/16 0959 Last data filed at 05/23/16 [id number]  Gross per 24 hour  Intake              490 ml  Output              925 ml  Net             -435 ml   Filed Weights   05/22/16 2223  Weight: 106 kg (233 lb 11 oz)    General appearance: alert, cooperative, appears stated age and no distress Resp: Not much change in exam compared to yesterday. Continues to have wheezing/rhonchi and crackles in the right upper chest. Wheezing also heard in the left chest today. Normal effort, though. No use of accessory muscles. Cardio: regular rate and rhythm, S1, S2 normal, no murmur, click, rub or gallop GI: Abdomen is mildly tender diffusely. No rebound, rigidity or guarding. No masses or organomegaly. Bowel sounds present. Extremities: extremities normal, atraumatic, no cyanosis or edema Neurologic: Awake and alert. Oriented 3. No focal neurological deficits.  Lab Results:  Data Reviewed: I have personally reviewed following labs and imaging studies  CBC:  Recent Labs Lab 05/21/16 0800 05/21/16 1020 05/22/16 0358 05/23/16 [id number]  WBC 6.0 5.5 4.6 9.8  NEUTROABS 1.7  --   --   --   HGB 11.9* 11.3* 12.0 11.7*  HCT 37.0 35.7* 37.2 37.5  MCV 86.7 87.3 86.7 88.2  PLT 185 185 209 [id number]    Basic Metabolic Panel:  Recent Labs Lab 05/21/16 0800 05/21/16 1020 05/22/16 0358 05/23/16 0629  NA 142  --  139 139  K 3.6  --  3.4* 4.4  CL 107  --  105 106  CO2 27  --  26 28  GLUCOSE 148*  --  236* 311*  BUN 8  --  10 15  CREATININE 0.90 0.84 0.98 0.96  CALCIUM 8.8*  --  8.9 8.9  MG  --  1.9  --   --      GFR: Estimated Creatinine Clearance: 72 mL/min (by C-G formula based on SCr of 0.96 mg/dL).  Liver Function Tests:  Recent Labs Lab 05/22/16 0913 05/23/16 0629  AST 28 22  ALT 32 26  ALKPHOS 62 56  BILITOT 0.7 0.6  PROT 6.4* 6.1*  ALBUMIN 3.3* 3.2*     Recent Labs Lab 05/22/16 0913  LIPASE 27   Cardiac Enzymes:  Recent Labs Lab 05/21/16 0800 05/21/16 1020 05/21/16 1756 05/21/16 2331  TROPONINI 0.05* 0.05* 0.05* 0.05*    HbA1C:  Recent Labs  05/21/16 1132  HGBA1C 6.6*    CBG:  Recent Labs Lab 05/21/16 2123 05/22/16 0745 05/22/16 1145 05/22/16 1610 05/23/16 0742  GLUCAP 238* 227* 274* 270* 265*    Radiology Studies: Ct Angio Chest Pe W Or Wo Contrast  Result Date: 05/21/2016 CLINICAL DATA:  Cough, chest pain, shortness of Breath EXAM: CT ANGIOGRAPHY CHEST WITH CONTRAST TECHNIQUE: Multidetector CT imaging of the chest was performed using the standard protocol during bolus administration of intravenous contrast. Multiplanar CT image reconstructions and MIPs were obtained to evaluate the vascular anatomy. CONTRAST:  80 cc Isovue 370 IV COMPARISON:  05/24/2014 FINDINGS: Cardiovascular: No filling defects in the pulmonary arteries to suggest pulmonary emboli. Insert Heart Mediastinum/Nodes: No mediastinal, hilar, or axillary adenopathy. Thyroid is enlarged with probable 2.8 cm nodule in the right thyroid lobe. Findings similar to prior study. Lungs/Pleura: Airspace disease in the right upper lobe compatible with pneumonia. Lungs otherwise clear. No effusions. Upper Abdomen: Imaging into the upper abdomen shows no acute findings. Musculoskeletal: Chest wall soft tissues are unremarkable. No acute bony abnormality. Review of the MIP images confirms the above findings. IMPRESSION: Right upper lobe consolidation compatible with pneumonia. No evidence of pulmonary embolus. Enlarged thyroid with probable right thyroid lobe nodule, suggesting multinodular goiter, not  significantly changed since prior CT. This could be further evaluated with elective thyroid ultrasound. Electronically Signed   By: Rolm Baptise M.D.   On: 05/21/2016 12:00     Medications:  Scheduled: . acetylcysteine  3 mL Nebulization TID  . aspirin  325 mg Oral Daily  . atorvastatin  40 mg Oral QHS  . budesonide (PULMICORT) nebulizer solution  0.25 mg Nebulization BID  . carvedilol  10 mg Oral Daily  . cyclobenzaprine  5 mg Oral BID  . enoxaparin (LOVENOX) injection  40 mg Subcutaneous Q24H  . fluticasone  1 spray Each Nare Daily  . furosemide  40 mg Oral Daily  . gabapentin  600 mg Oral TID  . guaiFENesin  1,200 mg Oral BID  . insulin aspart  0-9 Units Subcutaneous TID WC  . insulin glargine  15 Units Subcutaneous Daily  . ipratropium-albuterol  3 mL  Nebulization QID  . isosorbide dinitrate  20 mg Oral BID  . levofloxacin (LEVAQUIN) IV  750 mg Intravenous Q24H  . lisinopril  20 mg Oral Daily  . methylPREDNISolone (SOLU-MEDROL) injection  60 mg Intravenous Q6H  . pantoprazole  40 mg Oral Daily  . sertraline  100 mg Oral Daily  . sodium chloride flush  3 mL Intravenous Q12H   Continuous:  [id number] **OR** acetaminophen, ipratropium-albuterol, nitroGLYCERIN, ondansetron **OR** ondansetron (ZOFRAN) IV, polyethylene glycol, traZODone  Assessment/Plan:  Active Problems:   Community acquired pneumonia   CAP (community acquired pneumonia)    Community-acquired pneumonia CT scan did not show any pulmonary embolism. Right sided infiltrate was noted. Continue with IV Levaquin. Influenza PCR negative. Symptomatic treatment. She does have rhonchi and wheezing. Continue nebulizer treatments and steroids. Will increase steroids due to persistent wheezing. Add Mucomyst for 3 doses. Continue Mucinex.  Pleuritic Chest pain with mildly elevated troponin  Patient has right-sided chest pain. She did have a mildly elevated troponin. CT scan does not show any PE. Chest pain is most  likely due to pneumonia. Mildly elevated troponin could be suggestive of demand ischemia. Patient had a cardiac catheterization in 2016, which did not show any coronary artery disease. Unlikely this is cardiac in etiology. Echocardiogram was done. Normal systolic function was noted. Grade 2 diastolic dysfunction was noted. Her pain is pleuritic. EKG showed nonspecific ST and T wave changes. Some of these seen previously as well. Repeat EKG shows similar findings. Do not anticipate any further testing hospital at this time.  Nausea and dry heaves Possibly due to coughing episodes. LFTs are normal. Lipase is normal. Continue to monitor for now.  Essential Hypertension Currently stable. Continue home medication and adjust the doses if needed depending on the BP readings.  DM type II  HbA1c 6.6. CBGs, not well controlled. Increase dose of Lantus. Continue sliding scale coverage. Hyperglycemia is due to steroids.   Hyperlipidemia Continue statin therapy  Enlarged thyroid with right Thyroid nodule This was incidentally noted on the CT scan. We will check TSH. Will need further evaluation as outpatient.   DVT Prophylaxis: Lovenox    Code Status: Full code  Family Communication: Discussed with the patient  Disposition Plan: Management as outlined above. Mobilize as tolerated.    LOS: 1 day   [facility] Pager [phone] 05/23/2016, 9:59 AM  If 7PM-7AM, please contact night-coverage at [email or web address], password [facility]

## 2016-05-23 NOTE — Progress Notes (Signed)
CPAP machine checked for patient.  Patient able to place herself on.  Told patient to have RT called for any question/issue w/CPAP.

## 2016-05-24 DIAGNOSIS — F3289 Other specified depressive episodes: Secondary | ICD-10-CM

## 2016-05-24 DIAGNOSIS — E785 Hyperlipidemia, unspecified: Secondary | ICD-10-CM

## 2016-05-24 DIAGNOSIS — E1169 Type 2 diabetes mellitus with other specified complication: Secondary | ICD-10-CM

## 2016-05-24 LAB — GLUCOSE, CAPILLARY
Glucose-Capillary: 252 mg/dL — ABNORMAL HIGH (ref 65–99)
Glucose-Capillary: 295 mg/dL — ABNORMAL HIGH (ref 65–99)
Glucose-Capillary: 388 mg/dL — ABNORMAL HIGH (ref 65–99)
Glucose-Capillary: 332 mg/dL — ABNORMAL HIGH (ref 65–99)

## 2016-05-24 LAB — TSH: TSH: 0.112 u[IU]/mL — ABNORMAL LOW (ref 0.350–4.500)

## 2016-05-24 MED ORDER — INSULIN ASPART 100 UNIT/ML ~~LOC~~ SOLN
4.0000 [IU] | Freq: Three times a day (TID) | SUBCUTANEOUS | Status: DC
  Administered 2016-05-24 – 2016-05-28 (×13): 4 [IU] via SUBCUTANEOUS

## 2016-05-24 MED ORDER — INSULIN ASPART 100 UNIT/ML ~~LOC~~ SOLN
0.0000 [IU] | Freq: Three times a day (TID) | SUBCUTANEOUS | Status: DC
  Administered 2016-05-24: 15 [IU] via SUBCUTANEOUS
  Administered 2016-05-25 (×3): 11 [IU] via SUBCUTANEOUS
  Administered 2016-05-26 (×2): 3 [IU] via SUBCUTANEOUS
  Administered 2016-05-26: 5 [IU] via SUBCUTANEOUS
  Administered 2016-05-27: 3 [IU] via SUBCUTANEOUS
  Administered 2016-05-27: 8 [IU] via SUBCUTANEOUS
  Administered 2016-05-27 – 2016-05-28 (×4): 3 [IU] via SUBCUTANEOUS

## 2016-05-24 MED ORDER — INSULIN ASPART 100 UNIT/ML ~~LOC~~ SOLN
0.0000 [IU] | Freq: Every day | SUBCUTANEOUS | Status: DC
  Administered 2016-05-24: 4 [IU] via SUBCUTANEOUS
  Administered 2016-05-25: 3 [IU] via SUBCUTANEOUS

## 2016-05-24 MED ORDER — IPRATROPIUM-ALBUTEROL 0.5-2.5 (3) MG/3ML IN SOLN
3.0000 mL | Freq: Three times a day (TID) | RESPIRATORY_TRACT | Status: DC
  Administered 2016-05-24 – 2016-05-27 (×9): 3 mL via RESPIRATORY_TRACT
  Filled 2016-05-24 (×9): qty 3

## 2016-05-24 NOTE — Progress Notes (Signed)
Results for DINAMARIE, BELROSE (MRN [id number]) as of 05/24/2016 12:03  Ref. Range 05/23/2016 07:42 05/23/2016 12:39 05/23/2016 16:38 05/24/2016 08:26 05/24/2016 11:27  Glucose-Capillary Latest Ref Range: 65 - 99 mg/dL 265 (H) 242 (H) 314 (H) 252 (H) 295 (H)  Noted that blood sugars continue to be elevated.  Recommend increasing Novolog correction scale to MODERATE TID & HS.  If post prandial blood sugars continue to be elevated, recommend adding Novolog 4 units TID with meals if eating at least 50% of meals. Will continue to monitor blood sugars while in the hospital. Harvel Ricks RN BSN CDE

## 2016-05-24 NOTE — Progress Notes (Signed)
Patient ID: [name], female   DOB: 1960/09/26, 56 y.o.   MRN: [id number]  PROGRESS NOTE    [name]  [id number] DOB: 1961/01/21 DOA: 05/21/2016  PCP: Velna Hatchet, MD   Brief Narrative:  56 year old African-American female with a past medical history of hypertension, diabetes, hyperlipidemia, diastolic dysfunction, morbid obesity who presented to [facility] with worsening shortness of breath, right-sided chest pain, cough productive of clear sputum. She was found to have infiltrates in right lung concerning for pneumonia. She was started on empiric antibiotics while awaiting culture results.   Assessment & Plan:   Active Problems: Acute respiratory failure with hypoxia / lobar pneumonia, unspecified organism - Chest x-ray admission showed patchy right upper lobe airspace disease concerning for pneumonia - CT Angio chest showed right upper lobe consolidation compatible with pneumonia, no evidence of pulmonary embolism - Blood cultures so far negative - Continue current antibiotic, Levaquin - Continue Pulmicort nebulizer twice daily, DuoNeb nebulizer 3 times daily - Continue Solu-Medrol 60 mg IV every 6 hours - Continue oxygen support via nasal cannula to keep oxygen saturation above [id number]  Chronic diastolic CHF - Compensated - Continue Lasix 40 mg daily  Controlled type 2 diabetes mellitus without long-term insulin use - A1c on this admission 6.6 indicating good glycemic control - She is on IV steroids so currently on sliding scale insulin in addition to Lantus 15 units daily and 4 units 3 times daily  Diabetic neuropathy - Continue gabapentin  Dyslipidemia associated with type 2 diabetes mellitus - Continue Lipitor 40 mg at bedtime  Essential hypertension - Continue carvedilol 10 mg daily, isosorbide dinitrate 20 mg twice daily, lisinopril 20 mg daily  Depression - Continue Zoloft 100 mg daily   DVT prophylaxis: Lovenox subcutaneous Code Status:  full code  Family Communication: No family at the bedside Disposition Plan: Home likely by 05/26/2016 if respiratory status stable   Consultants:   Diabetic coordinator  Procedures:   None  Antimicrobials:   Levaquin 05/22/2016 -->   Subjective: Still wheezing this morning, coughing.  Objective: Vitals:   05/23/16 2007 05/23/16 2101 05/24/16 0523 05/24/16 1022  BP:  (!) 148/75 (!) 155/83 131/65  Pulse: 86 82 73 79  Resp: 18 17 18 17   Temp:  97.7 F (36.5 C) 97.8 F (36.6 C) 98.3 F (36.8 C)  TempSrc:    Oral  SpO2:  95% 91% 92%  Weight:   102 kg (224 lb 13.9 oz)   Height:        Intake/Output Summary (Last 24 hours) at 05/24/16 1243 Last data filed at 05/24/16 1205  Gross per 24 hour  Intake             1090 ml  Output             1900 ml  Net             -810 ml   Filed Weights   05/22/16 2223 05/24/16 0523  Weight: 106 kg (233 lb 11 oz) 102 kg (224 lb 13.9 oz)    Examination:  General exam: Appears calm and comfortable  Respiratory system: Wheezing in upper lung lobes, no rhonchi Cardiovascular system: S1 & S2 heard, RRR. Gastrointestinal system: Abdomen is nondistended, soft and nontender. No organomegaly or masses felt. Normal bowel sounds heard. Central nervous system: Alert and oriented. No focal neurological deficits. Extremities: Symmetric 5 x 5 power. Skin: No rashes, lesions or ulcers Psychiatry: Judgement and insight appear normal. Mood & affect appropriate.  Data Reviewed: I have personally reviewed following labs and imaging studies  CBC:  Recent Labs Lab 05/21/16 0800 05/21/16 1020 05/22/16 0358 05/23/16 0629  WBC 6.0 5.5 4.6 9.8  NEUTROABS 1.7  --   --   --   HGB 11.9* 11.3* 12.0 11.7*  HCT 37.0 35.7* 37.2 37.5  MCV 86.7 87.3 86.7 88.2  PLT 185 185 209 [id number]   Basic Metabolic Panel:  Recent Labs Lab 05/21/16 0800 05/21/16 1020 05/22/16 0358 05/23/16 0629  NA 142  --  139 139  K 3.6  --  3.4* 4.4  CL 107  --  105 106    CO2 27  --  26 28  GLUCOSE 148*  --  236* 311*  BUN 8  --  10 15  CREATININE 0.90 0.84 0.98 0.96  CALCIUM 8.8*  --  8.9 8.9  MG  --  1.9  --   --    GFR: Estimated Creatinine Clearance: 70.3 mL/min (by C-G formula based on SCr of 0.96 mg/dL). Liver Function Tests:  Recent Labs Lab 05/22/16 0913 05/23/16 0629  AST 28 22  ALT 32 26  ALKPHOS 62 56  BILITOT 0.7 0.6  PROT 6.4* 6.1*  ALBUMIN 3.3* 3.2*    Recent Labs Lab 05/22/16 0913  LIPASE 27   No results for input(s): AMMONIA in the last 168 hours. Coagulation Profile: No results for input(s): INR, PROTIME in the last 168 hours. Cardiac Enzymes:  Recent Labs Lab 05/21/16 0800 05/21/16 1020 05/21/16 1756 05/21/16 2331  TROPONINI 0.05* 0.05* 0.05* 0.05*   BNP (last 3 results) No results for input(s): PROBNP in the last 8760 hours. HbA1C: No results for input(s): HGBA1C in the last 72 hours. CBG:  Recent Labs Lab 05/23/16 0742 05/23/16 1239 05/23/16 1638 05/24/16 0826 05/24/16 1127  GLUCAP 265* 242* 314* 252* 295*   Lipid Profile: No results for input(s): CHOL, HDL, LDLCALC, TRIG, CHOLHDL, LDLDIRECT in the last 72 hours. Thyroid Function Tests:  Recent Labs  05/24/16 0629  TSH 0.112*   Anemia Panel: No results for input(s): VITAMINB12, FOLATE, FERRITIN, TIBC, IRON, RETICCTPCT in the last 72 hours. Urine analysis:    Component Value Date/Time   COLORURINE YELLOW 12/14/2015 0627   APPEARANCEUR CLEAR 12/14/2015 0627   LABSPEC 1.021 12/14/2015 0627   PHURINE 6.0 12/14/2015 0627   GLUCOSEU NEGATIVE 12/14/2015 0627   HGBUR NEGATIVE 12/14/2015 0627   BILIRUBINUR NEGATIVE 12/14/2015 0627   KETONESUR NEGATIVE 12/14/2015 0627   PROTEINUR NEGATIVE 12/14/2015 0627   UROBILINOGEN 0.2 09/27/2014 1920   NITRITE NEGATIVE 12/14/2015 0627   LEUKOCYTESUR NEGATIVE 12/14/2015 0627   Sepsis Labs: @LABRCNTIP (procalcitonin:4,lacticidven:4)   Recent Results (from the past 240 hour(s))  Culture, blood  (routine x 2) Call MD if unable to obtain prior to antibiotics being given     Status: None (Preliminary result)   Collection Time: 05/21/16 10:35 AM  Result Value Ref Range Status   Specimen Description BLOOD LEFT FOREARM  Final   Special Requests IN PEDIATRIC BOTTLE [place]  Final   Culture NO GROWTH 2 DAYS  Final   Report Status PENDING  Incomplete  Culture, blood (routine x 2) Call MD if unable to obtain prior to antibiotics being given     Status: None (Preliminary result)   Collection Time: 05/21/16 10:39 AM  Result Value Ref Range Status   Specimen Description LEFT ANTECUBITAL  Final   Special Requests BOTTLES DRAWN AEROBIC AND ANAEROBIC  [place]  Final   Culture NO GROWTH 2  DAYS  Final   Report Status PENDING  Incomplete      Radiology Studies: Dg Chest 2 View Result Date: 05/21/2016 Patchy right upper lobe airspace disease, possible pneumonia.   Ct Angio Chest Pe W Or Wo Contrast Result Date: 05/21/2016 Right upper lobe consolidation compatible with pneumonia. No evidence of pulmonary embolus. Enlarged thyroid with probable right thyroid lobe nodule, suggesting multinodular goiter, not significantly changed since prior CT. This could be further evaluated with elective thyroid ultrasound. Electronically Signed   By: Rolm Baptise M.D.   On: 05/21/2016 12:00     Scheduled Meds: . aspirin  325 mg Oral Daily  . atorvastatin  40 mg Oral QHS  . budesonide (PULMICORT) nebulizer solution  0.25 mg Nebulization BID  . carvedilol  10 mg Oral Daily  . cyclobenzaprine  5 mg Oral BID  . enoxaparin (LOVENOX) injection  40 mg Subcutaneous Q24H  . fluticasone  1 spray Each Nare Daily  . furosemide  40 mg Oral Daily  . gabapentin  600 mg Oral TID  . guaiFENesin  1,200 mg Oral BID  . insulin aspart  0-15 Units Subcutaneous TID WC  . insulin aspart  0-5 Units Subcutaneous QHS  . insulin aspart  4 Units Subcutaneous TID WC  . insulin glargine  15 Units Subcutaneous Daily  . ipratropium-albuterol   3 mL Nebulization TID  . isosorbide dinitrate  20 mg Oral BID  . levofloxacin (LEVAQUIN) IV  750 mg Intravenous Q24H  . lisinopril  20 mg Oral Daily  . methylPREDNISolone (SOLU-MEDROL) injection  60 mg Intravenous Q6H  . pantoprazole  40 mg Oral Daily  . sertraline  100 mg Oral Daily  . sodium chloride flush  3 mL Intravenous Q12H   Continuous Infusions:   LOS: 2 days    Time spent: 25 minutes  Greater than 50% of the time spent on counseling and coordinating the care.   Leisa Lenz, MD [facility] Pager [phone]  If 7PM-7AM, please contact night-coverage [email or web address] Password [facility] 05/24/2016, 12:43 PM

## 2016-05-25 DIAGNOSIS — J189 Pneumonia, unspecified organism: Secondary | ICD-10-CM

## 2016-05-25 HISTORY — DX: Pneumonia, unspecified organism: J18.9

## 2016-05-25 LAB — GLUCOSE, CAPILLARY
Glucose-Capillary: 302 mg/dL — ABNORMAL HIGH (ref 65–99)
Glucose-Capillary: 345 mg/dL — ABNORMAL HIGH (ref 65–99)
Glucose-Capillary: 348 mg/dL — ABNORMAL HIGH (ref 65–99)
Glucose-Capillary: 289 mg/dL — ABNORMAL HIGH (ref 65–99)

## 2016-05-25 LAB — BASIC METABOLIC PANEL
Anion gap: 7 (ref 5–15)
BUN: 22 mg/dL — ABNORMAL HIGH (ref 6–20)
CO2: 29 mmol/L (ref 22–32)
Calcium: 9 mg/dL (ref 8.9–10.3)
Chloride: 103 mmol/L (ref 101–111)
Creatinine, Ser: 0.92 mg/dL (ref 0.44–1.00)
GFR calc Af Amer: >60 mL/min (ref >60)
GFR calc non Af Amer: >60 mL/min (ref >60)
Glucose, Bld: 339 mg/dL — ABNORMAL HIGH (ref 65–99)
Potassium: 4 mmol/L (ref 3.5–5.1)
Sodium: 139 mmol/L (ref 135–145)

## 2016-05-25 LAB — CBC
HCT: 37.1 % (ref 36.0–46.0)
Hemoglobin: 11.9 g/dL — ABNORMAL LOW (ref 12.0–15.0)
MCH: 27.7 pg (ref 26.0–34.0)
MCHC: 32.1 g/dL (ref 30.0–36.0)
MCV: 86.5 fL (ref 78.0–100.0)
Platelets: 214 10*3/uL (ref 150–400)
RBC: 4.29 MIL/uL (ref 3.87–5.11)
RDW: 14.3 % (ref 11.5–15.5)
WBC: 10.6 10*3/uL — ABNORMAL HIGH (ref 4.0–10.5)

## 2016-05-25 MED ORDER — LEVOFLOXACIN 750 MG PO TABS
750.0000 mg | ORAL_TABLET | Freq: Every day | ORAL | Status: DC
  Administered 2016-05-26 – 2016-05-28 (×3): 750 mg via ORAL
  Filled 2016-05-25 (×3): qty 1

## 2016-05-25 MED ORDER — HYDRALAZINE HCL 25 MG PO TABS
25.0000 mg | ORAL_TABLET | Freq: Three times a day (TID) | ORAL | Status: DC
  Administered 2016-05-25 – 2016-05-28 (×11): 25 mg via ORAL
  Filled 2016-05-25 (×11): qty 1

## 2016-05-25 NOTE — Procedures (Signed)
Placed patient on CPAP for the night.  Patient is tolerating well at this time. 

## 2016-05-25 NOTE — Progress Notes (Signed)
Patient ID: [name], female   DOB: Mar 05, 1961, 56 y.o.   MRN: [id number]  PROGRESS NOTE    [name]  [id number] DOB: 22-Jan-1961 DOA: 05/21/2016  PCP: Velna Hatchet, MD   Brief Narrative:  56 year old African-American female with a past medical history of hypertension, diabetes, hyperlipidemia, diastolic dysfunction, morbid obesity who presented to [facility] with worsening shortness of breath, right-sided chest pain, cough productive of clear sputum. She was found to have infiltrates in right lung concerning for pneumonia. She was started on empiric antibiotics while awaiting culture results.   Assessment & Plan:   Active Problems: Acute respiratory failure with hypoxia / lobar pneumonia, unspecified organism - Chest x-ray admission showed patchy right upper lobe airspace disease concerning for pneumonia - CT Angio chest showed right upper lobe consolidation compatible with pneumonia, no evidence of pulmonary embolism - Blood cultures so far negative - Continue Levaquin - Continue Pulmicort nebulizer twice daily, DuoNeb nebulizer 3 times daily - Stop steroids today  - Continue oxygen support via nasal cannula to keep oxygen saturation above [id number]  Chronic diastolic CHF - Compensated - Continue Lasix 40 mg daily  Controlled type 2 diabetes mellitus without long-term insulin use - A1c on this admission 6.6 indicating good glycemic control - Continue current insulin regimen   Diabetic neuropathy - Continue gabapentin  Dyslipidemia associated with type 2 diabetes mellitus - Continue Lipitor 40 mg at bedtime  Essential hypertension - Continue carvedilol 10 mg daily, isosorbide dinitrate 20 mg twice daily, lisinopril 20 mg daily - Added hydralazine for better BP control. SBP in 170's this am  Depression - Continue Zoloft 100 mg daily  Low TSH - Has had TSH 0.167 about 3 years ago but she says she is not aware of this - TSH on this admission 0.112 -  Will set up endo appt for her on discharge    DVT prophylaxis: Lovenox subcutaneous Code Status: full code  Family Communication: No family at the bedside Disposition Plan: Home likely by 05/26/2016 if respiratory status stable   Consultants:   Diabetic coordinator  Procedures:   None  Antimicrobials:   Levaquin 05/22/2016 -->   Subjective: Feels tired.   Objective: Vitals:   05/24/16 2143 05/25/16 0500 05/25/16 0823 05/25/16 0950  BP: (!) 154/72 (!) 158/70 (!) 170/91   Pulse: 76 79 74 92  Resp: 18 16 16 18   Temp: 98.1 F (36.7 C) 98.6 F (37 C) 98.9 F (37.2 C)   TempSrc: Oral Oral Oral   SpO2: 95% 96% 96% 94%  Weight: 102.2 kg (225 lb 4.8 oz)     Height:        Intake/Output Summary (Last 24 hours) at 05/25/16 1212 Last data filed at 05/25/16 0920  Gross per 24 hour  Intake             1380 ml  Output             3175 ml  Net            -1795 ml   Filed Weights   05/22/16 2223 05/24/16 0523 05/24/16 2143  Weight: 106 kg (233 lb 11 oz) 102 kg (224 lb 13.9 oz) 102.2 kg (225 lb 4.8 oz)    Examination:  General exam: Appears calm and comfortable, no distress  Respiratory system: no wheezing but she is not taking deep breaths  Cardiovascular system: S1 & S2 heard, Rate controlled Gastrointestinal system: (+) BS, non tender  Central nervous system: No focal  neurological deficits. Extremities: No tenderness, palpable pulses  Skin: Skin is warm and dry  Psychiatry: Mood & affect appropriate.   Data Reviewed: I have personally reviewed following labs and imaging studies  CBC:  Recent Labs Lab 05/21/16 0800 05/21/16 1020 05/22/16 0358 05/23/16 0629 05/25/16 0553  WBC 6.0 5.5 4.6 9.8 10.6*  NEUTROABS 1.7  --   --   --   --   HGB 11.9* 11.3* 12.0 11.7* 11.9*  HCT 37.0 35.7* 37.2 37.5 37.1  MCV 86.7 87.3 86.7 88.2 86.5  PLT 185 185 209 223 Q000111Q   Basic Metabolic Panel:  Recent Labs Lab 05/21/16 0800 05/21/16 1020 05/22/16 0358 05/23/16 0629  05/25/16 0553  NA 142  --  139 139 139  K 3.6  --  3.4* 4.4 4.0  CL 107  --  105 106 103  CO2 27  --  26 28 29   GLUCOSE 148*  --  236* 311* 339*  BUN 8  --  10 15 22*  CREATININE 0.90 0.84 0.98 0.96 0.92  CALCIUM 8.8*  --  8.9 8.9 9.0  MG  --  1.9  --   --   --    GFR: Estimated Creatinine Clearance: 73.5 mL/min (by C-G formula based on SCr of 0.92 mg/dL). Liver Function Tests:  Recent Labs Lab 05/22/16 0913 05/23/16 0629  AST 28 22  ALT 32 26  ALKPHOS 62 56  BILITOT 0.7 0.6  PROT 6.4* 6.1*  ALBUMIN 3.3* 3.2*    Recent Labs Lab 05/22/16 0913  LIPASE 27   No results for input(s): AMMONIA in the last 168 hours. Coagulation Profile: No results for input(s): INR, PROTIME in the last 168 hours. Cardiac Enzymes:  Recent Labs Lab 05/21/16 0800 05/21/16 1020 05/21/16 1756 05/21/16 2331  TROPONINI 0.05* 0.05* 0.05* 0.05*   BNP (last 3 results) No results for input(s): PROBNP in the last 8760 hours. HbA1C: No results for input(s): HGBA1C in the last 72 hours. CBG:  Recent Labs Lab 05/24/16 0826 05/24/16 1127 05/24/16 1624 05/24/16 2108 05/25/16 0737  GLUCAP 252* 295* 388* 332* 302*   Lipid Profile: No results for input(s): CHOL, HDL, LDLCALC, TRIG, CHOLHDL, LDLDIRECT in the last 72 hours. Thyroid Function Tests:  Recent Labs  05/24/16 0629  TSH 0.112*   Anemia Panel: No results for input(s): VITAMINB12, FOLATE, FERRITIN, TIBC, IRON, RETICCTPCT in the last 72 hours. Urine analysis:    Component Value Date/Time   COLORURINE YELLOW 12/14/2015 0627   APPEARANCEUR CLEAR 12/14/2015 0627   LABSPEC 1.021 12/14/2015 0627   PHURINE 6.0 12/14/2015 0627   GLUCOSEU NEGATIVE 12/14/2015 0627   HGBUR NEGATIVE 12/14/2015 0627   BILIRUBINUR NEGATIVE 12/14/2015 0627   KETONESUR NEGATIVE 12/14/2015 0627   PROTEINUR NEGATIVE 12/14/2015 0627   UROBILINOGEN 0.2 09/27/2014 1920   NITRITE NEGATIVE 12/14/2015 0627   LEUKOCYTESUR NEGATIVE 12/14/2015 0627   Sepsis  Labs: @LABRCNTIP (procalcitonin:4,lacticidven:4)   Recent Results (from the past 240 hour(s))  Culture, blood (routine x 2) Call MD if unable to obtain prior to antibiotics being given     Status: None (Preliminary result)   Collection Time: 05/21/16 10:35 AM  Result Value Ref Range Status   Specimen Description BLOOD LEFT FOREARM  Final   Special Requests IN PEDIATRIC BOTTLE 1[place]  Final   Culture NO GROWTH 3 DAYS  Final   Report Status PENDING  Incomplete  Culture, blood (routine x 2) Call MD if unable to obtain prior to antibiotics being given  Status: None (Preliminary result)   Collection Time: 05/21/16 10:39 AM  Result Value Ref Range Status   Specimen Description LEFT ANTECUBITAL  Final   Special Requests BOTTLES DRAWN AEROBIC AND ANAEROBIC  [place]  Final   Culture NO GROWTH 3 DAYS  Final   Report Status PENDING  Incomplete      Radiology Studies: Dg Chest 2 View Result Date: 05/21/2016 Patchy right upper lobe airspace disease, possible pneumonia.   Ct Angio Chest Pe W Or Wo Contrast Result Date: 05/21/2016 Right upper lobe consolidation compatible with pneumonia. No evidence of pulmonary embolus. Enlarged thyroid with probable right thyroid lobe nodule, suggesting multinodular goiter, not significantly changed since prior CT. This could be further evaluated with elective thyroid ultrasound. Electronically Signed   By: Rolm Baptise M.D.   On: 05/21/2016 12:00     Scheduled Meds: . aspirin  325 mg Oral Daily  . atorvastatin  40 mg Oral QHS  . budesonide (PULMICORT) nebulizer solution  0.25 mg Nebulization BID  . carvedilol  10 mg Oral Daily  . cyclobenzaprine  5 mg Oral BID  . enoxaparin (LOVENOX) injection  40 mg Subcutaneous Q24H  . fluticasone  1 spray Each Nare Daily  . furosemide  40 mg Oral Daily  . gabapentin  600 mg Oral TID  . guaiFENesin  1,200 mg Oral BID  . hydrALAZINE  25 mg Oral Q8H  . insulin aspart  0-15 Units Subcutaneous TID WC  . insulin aspart  0-5  Units Subcutaneous QHS  . insulin aspart  4 Units Subcutaneous TID WC  . insulin glargine  15 Units Subcutaneous Daily  . ipratropium-albuterol  3 mL Nebulization TID  . isosorbide dinitrate  20 mg Oral BID  . levofloxacin (LEVAQUIN) IV  750 mg Intravenous Q24H  . lisinopril  20 mg Oral Daily  . pantoprazole  40 mg Oral Daily  . sertraline  100 mg Oral Daily  . sodium chloride flush  3 mL Intravenous Q12H   Continuous Infusions:   LOS: 3 days    Time spent: 15 minutes  Greater than 50% of the time spent on counseling and coordinating the care.   Leisa Lenz, MD [facility] Pager [phone]  If 7PM-7AM, please contact night-coverage [email or web address] Password [email or web address] 05/25/2016, 12:12 PM

## 2016-05-25 NOTE — Progress Notes (Addendum)
Results for CALII, MCMANIGLE (MRN [id number]) as of 05/25/2016 14:44  Ref. Range 05/24/2016 08:26 05/24/2016 11:27 05/24/2016 16:24 05/24/2016 21:08 05/25/2016 07:37  Glucose-Capillary Latest Ref Range: 65 - 99 mg/dL 252 (H) 295 (H) 388 (H) 332 (H) 302 (H)  Noted that blood sugars continue to be greater than 300 mg/dl.  Recommend increasing Lantus to 20 units daily, increase Novolog correction scale to RESISTANT TID & HS, and increase Novolog meal coverage to 6 units TID if eats at least 50% of meals. Harvel Ricks RN BSN CDE

## 2016-05-26 LAB — GLUCOSE, CAPILLARY
Glucose-Capillary: 338 mg/dL — ABNORMAL HIGH (ref 65–99)
Glucose-Capillary: 200 mg/dL — ABNORMAL HIGH (ref 65–99)
Glucose-Capillary: 226 mg/dL — ABNORMAL HIGH (ref 65–99)
Glucose-Capillary: 176 mg/dL — ABNORMAL HIGH (ref 65–99)
Glucose-Capillary: 186 mg/dL — ABNORMAL HIGH (ref 65–99)

## 2016-05-26 LAB — CULTURE, BLOOD (ROUTINE X 2)
Culture: NO GROWTH
Culture: NO GROWTH

## 2016-05-26 LAB — BASIC METABOLIC PANEL
Anion gap: 8 (ref 5–15)
BUN: 18 mg/dL (ref 6–20)
CO2: 27 mmol/L (ref 22–32)
Calcium: 8.5 mg/dL — ABNORMAL LOW (ref 8.9–10.3)
Chloride: 102 mmol/L (ref 101–111)
Creatinine, Ser: 0.95 mg/dL (ref 0.44–1.00)
GFR calc Af Amer: >60 mL/min (ref >60)
GFR calc non Af Amer: >60 mL/min (ref >60)
Glucose, Bld: 202 mg/dL — ABNORMAL HIGH (ref 65–99)
Potassium: 3.6 mmol/L (ref 3.5–5.1)
Sodium: 137 mmol/L (ref 135–145)

## 2016-05-26 LAB — CBC
HCT: 38 % (ref 36.0–46.0)
Hemoglobin: 12.2 g/dL (ref 12.0–15.0)
MCH: 27.7 pg (ref 26.0–34.0)
MCHC: 32.1 g/dL (ref 30.0–36.0)
MCV: 86.2 fL (ref 78.0–100.0)
Platelets: 191 10*3/uL (ref 150–400)
RBC: 4.41 MIL/uL (ref 3.87–5.11)
RDW: 13.9 % (ref 11.5–15.5)
WBC: 9.9 10*3/uL (ref 4.0–10.5)

## 2016-05-26 MED ORDER — INSULIN GLARGINE 100 UNIT/ML ~~LOC~~ SOLN
20.0000 [IU] | Freq: Every day | SUBCUTANEOUS | Status: DC
  Administered 2016-05-27 – 2016-05-28 (×2): 20 [IU] via SUBCUTANEOUS
  Filled 2016-05-26 (×2): qty 0.2

## 2016-05-26 NOTE — Progress Notes (Signed)
Patient refused CPAP for the night  

## 2016-05-26 NOTE — Care Management Important Message (Signed)
Important Message  Patient Details  Name: [name] MRN: [id number] Date of Birth: 01-01-1961   Medicare Important Message Given:  Yes    Eloise Picone 05/26/2016, 11:56 AM

## 2016-05-26 NOTE — Progress Notes (Signed)
Patient ID: [name], female   DOB: March 07, 1961, 56 y.o.   MRN: [id number]  PROGRESS NOTE    [name]  [id number] DOB: 27-Jun-1960 DOA: 05/21/2016  PCP: Velna Hatchet, MD   Brief Narrative:  56 year old African-American female with a past medical history of hypertension, diabetes, hyperlipidemia, diastolic dysfunction, morbid obesity who presented to [facility] with worsening shortness of breath, right-sided chest pain, cough productive of clear sputum. She was found to have infiltrates in right lung concerning for pneumonia. She was started on empiric antibiotics while awaiting culture results.   Assessment & Plan:   Active Problems: Acute respiratory failure with hypoxia / lobar pneumonia, unspecified organism - On admission, chest x-ray showed patchy right upper lobe airspace disease concerning for pneumonia - CT Angio chest showed right upper lobe consolidation compatible with pneumonia, no evidence of pulmonary embolism - Continue Levaquin for now  - Blood cultures so far negative - Continue Pulmicort nebulizer twice daily, DuoNeb nebulizer 3 times daily - Steroids stopped 2/2 - Continue oxygen support via nasal cannula to keep oxygen saturation above [id number]  Chronic diastolic CHF - Compensated - Continue Lasix 40 mg daily  Controlled type 2 diabetes mellitus without long-term insulin use - A1c on this admission 6.6 indicating good glycemic control - Increased lantus to 20 units a day and continue novolog 4 units tidac and SSI  Diabetic neuropathy - Will continue gabapentin  Dyslipidemia associated with type 2 diabetes mellitus - Will continue Lipitor 40 mg at bedtime  Essential hypertension - Continue carvedilol 10 mg daily, isosorbide dinitrate 20 mg twice daily, lisinopril 20 mg daily - Added hydralazine for better BP control. SBP in 170's this am  Depression - Continue Zoloft 100 mg daily  Low TSH - Has had TSH 0.167 about 3 years ago but she  says she is not aware of this - TSH on this admission 0.112 - Will set up endo appt for her on discharge    DVT prophylaxis: Lovenox subcutaneous Code Status: full code  Family Communication: No family at the bedside Disposition Plan: 2/3 if resp status stable    Consultants:   Diabetic coordinator  Procedures:   None  Antimicrobials:   Levaquin 05/22/2016 -->   Subjective: Feels tired.   Objective: Vitals:   05/25/16 2312 05/26/16 0447 05/26/16 0851 05/26/16 1016  BP:  (!) 116/58  (!) 156/90  Pulse: 80 77 80 90  Resp: 16 14 16 16   Temp:  98 F (36.7 C)  98.4 F (36.9 C)  TempSrc:  Oral  Oral  SpO2: 95% 96% 95% 96%  Weight:      Height:        Intake/Output Summary (Last 24 hours) at 05/26/16 1034 Last data filed at 05/26/16 1029  Gross per 24 hour  Intake              843 ml  Output             1450 ml  Net             -607 ml   Filed Weights   05/24/16 0523 05/24/16 2143 05/25/16 2051  Weight: 102 kg (224 lb 13.9 oz) 102.2 kg (225 lb 4.8 oz) 102.6 kg (226 lb 1.6 oz)    Examination:  General exam: Appears calm and comfortable, no distress  Respiratory system: no wheezing but she is not taking deep breaths  Cardiovascular system: S1 & S2 heard, Rate controlled Gastrointestinal system: (+) BS, non tender  Central nervous system: No focal neurological deficits. Extremities: No tenderness, palpable pulses  Skin: Skin is warm and dry  Psychiatry: Mood & affect appropriate.   Data Reviewed: I have personally reviewed following labs and imaging studies  CBC:  Recent Labs Lab 05/21/16 0800 05/21/16 1020 05/22/16 0358 05/23/16 0629 05/25/16 0553 05/26/16 0709  WBC 6.0 5.5 4.6 9.8 10.6* 9.9  NEUTROABS 1.7  --   --   --   --   --   HGB 11.9* 11.3* 12.0 11.7* 11.9* 12.2  HCT 37.0 35.7* 37.2 37.5 37.1 38.0  MCV 86.7 87.3 86.7 88.2 86.5 86.2  PLT 185 185 209 223 214 [id number]   Basic Metabolic Panel:  Recent Labs Lab 05/21/16 0800 05/21/16 1020  05/22/16 0358 05/23/16 0629 05/25/16 0553 05/26/16 0709  NA 142  --  139 139 139 137  K 3.6  --  3.4* 4.4 4.0 3.6  CL 107  --  105 106 103 102  CO2 27  --  26 28 29 27   GLUCOSE 148*  --  236* 311* 339* 202*  BUN 8  --  10 15 22* 18  CREATININE 0.90 0.84 0.98 0.96 0.92 0.95  CALCIUM 8.8*  --  8.9 8.9 9.0 8.5*  MG  --  1.9  --   --   --   --    GFR: Estimated Creatinine Clearance: 71.3 mL/min (by C-G formula based on SCr of 0.95 mg/dL). Liver Function Tests:  Recent Labs Lab 05/22/16 0913 05/23/16 0629  AST 28 22  ALT 32 26  ALKPHOS 62 56  BILITOT 0.7 0.6  PROT 6.4* 6.1*  ALBUMIN 3.3* 3.2*    Recent Labs Lab 05/22/16 0913  LIPASE 27   No results for input(s): AMMONIA in the last 168 hours. Coagulation Profile: No results for input(s): INR, PROTIME in the last 168 hours. Cardiac Enzymes:  Recent Labs Lab 05/21/16 0800 05/21/16 1020 05/21/16 1756 05/21/16 2331  TROPONINI 0.05* 0.05* 0.05* 0.05*   BNP (last 3 results) No results for input(s): PROBNP in the last 8760 hours. HbA1C: No results for input(s): HGBA1C in the last 72 hours. CBG:  Recent Labs Lab 05/25/16 1140 05/25/16 1656 05/25/16 1659 05/25/16 2204 05/26/16 0815  GLUCAP 338* 345* 348* 289* 200*   Lipid Profile: No results for input(s): CHOL, HDL, LDLCALC, TRIG, CHOLHDL, LDLDIRECT in the last 72 hours. Thyroid Function Tests:  Recent Labs  05/24/16 0629  TSH 0.112*   Anemia Panel: No results for input(s): VITAMINB12, FOLATE, FERRITIN, TIBC, IRON, RETICCTPCT in the last 72 hours. Urine analysis:    Component Value Date/Time   COLORURINE YELLOW 12/14/2015 0627   APPEARANCEUR CLEAR 12/14/2015 0627   LABSPEC 1.021 12/14/2015 0627   PHURINE 6.0 12/14/2015 0627   GLUCOSEU NEGATIVE 12/14/2015 0627   HGBUR NEGATIVE 12/14/2015 0627   BILIRUBINUR NEGATIVE 12/14/2015 0627   KETONESUR NEGATIVE 12/14/2015 0627   PROTEINUR NEGATIVE 12/14/2015 0627   UROBILINOGEN 0.2 09/27/2014 1920    NITRITE NEGATIVE 12/14/2015 0627   LEUKOCYTESUR NEGATIVE 12/14/2015 0627   Sepsis Labs: @LABRCNTIP (procalcitonin:4,lacticidven:4)   Recent Results (from the past 240 hour(s))  Culture, blood (routine x 2) Call MD if unable to obtain prior to antibiotics being given     Status: None (Preliminary result)   Collection Time: 05/21/16 10:35 AM  Result Value Ref Range Status   Specimen Description BLOOD LEFT FOREARM  Final   Special Requests IN PEDIATRIC BOTTLE [place]  Final   Culture NO GROWTH 4 DAYS  Final  Report Status PENDING  Incomplete  Culture, blood (routine x 2) Call MD if unable to obtain prior to antibiotics being given     Status: None (Preliminary result)   Collection Time: 05/21/16 10:39 AM  Result Value Ref Range Status   Specimen Description LEFT ANTECUBITAL  Final   Special Requests BOTTLES DRAWN AEROBIC AND ANAEROBIC  [place]  Final   Culture NO GROWTH 4 DAYS  Final   Report Status PENDING  Incomplete      Radiology Studies: Dg Chest 2 View Result Date: 05/21/2016 Patchy right upper lobe airspace disease, possible pneumonia.   Ct Angio Chest Pe W Or Wo Contrast Result Date: 05/21/2016 Right upper lobe consolidation compatible with pneumonia. No evidence of pulmonary embolus. Enlarged thyroid with probable right thyroid lobe nodule, suggesting multinodular goiter, not significantly changed since prior CT. This could be further evaluated with elective thyroid ultrasound. Electronically Signed   By: Rolm Baptise M.D.   On: 05/21/2016 12:00     Scheduled Meds: . aspirin  325 mg Oral Daily  . atorvastatin  40 mg Oral QHS  . budesonide (PULMICORT) nebulizer solution  0.25 mg Nebulization BID  . carvedilol  10 mg Oral Daily  . cyclobenzaprine  5 mg Oral BID  . enoxaparin (LOVENOX) injection  40 mg Subcutaneous Q24H  . fluticasone  1 spray Each Nare Daily  . furosemide  40 mg Oral Daily  . gabapentin  600 mg Oral TID  . guaiFENesin  1,200 mg Oral BID  . hydrALAZINE  25 mg  Oral Q8H  . insulin aspart  0-15 Units Subcutaneous TID WC  . insulin aspart  0-5 Units Subcutaneous QHS  . insulin aspart  4 Units Subcutaneous TID WC  . [START ON 05/27/2016] insulin glargine  20 Units Subcutaneous Daily  . ipratropium-albuterol  3 mL Nebulization TID  . isosorbide dinitrate  20 mg Oral BID  . levofloxacin  750 mg Oral Daily  . lisinopril  20 mg Oral Daily  . pantoprazole  40 mg Oral Daily  . sertraline  100 mg Oral Daily  . sodium chloride flush  3 mL Intravenous Q12H   Continuous Infusions:   LOS: 4 days    Time spent: 15 minutes  Greater than 50% of the time spent on counseling and coordinating the care.   Leisa Lenz, MD [facility] Pager [phone]  If 7PM-7AM, please contact night-coverage [email or web address] Password [facility] 05/26/2016, 10:34 AM

## 2016-05-27 LAB — GLUCOSE, CAPILLARY
Glucose-Capillary: 172 mg/dL — ABNORMAL HIGH (ref 65–99)
Glucose-Capillary: 256 mg/dL — ABNORMAL HIGH (ref 65–99)
Glucose-Capillary: 167 mg/dL — ABNORMAL HIGH (ref 65–99)
Glucose-Capillary: 157 mg/dL — ABNORMAL HIGH (ref 65–99)

## 2016-05-27 NOTE — Progress Notes (Signed)
Patient refused CPAP for the night  

## 2016-05-27 NOTE — Progress Notes (Signed)
Patient ID: [name], female   DOB: April 12, 1961, 56 y.o.   MRN: [id number]  PROGRESS NOTE    [name]  [id number] DOB: 1960-06-28 DOA: 05/21/2016  PCP: Velna Hatchet, MD   Brief Narrative:  56 year old African-American female with a past medical history of hypertension, diabetes, hyperlipidemia, diastolic dysfunction, morbid obesity who presented to [facility] with worsening shortness of breath, right-sided chest pain, cough productive of clear sputum. She was found to have infiltrates in right lung concerning for pneumonia. She was started on empiric antibiotics while awaiting culture results.   Assessment & Plan:   Active Problems: Acute respiratory failure with hypoxia / lobar pneumonia, unspecified organism - On admission, chest x-ray showed patchy right upper lobe airspace disease concerning for pneumonia - CT Angio chest showed right upper lobe consolidation compatible with pneumonia, no evidence of pulmonary embolism - Continue Levaquin  - Blood cultures so far negative - Continue Pulmicort nebulizer twice daily, DuoNeb nebulizer every 2 hours PRN shortness of breath or wheezing  - Steroids stopped 2/2 - Continue anti-tussives   Chronic diastolic CHF - Compensated - Continue Lasix 40 mg daily  Controlled type 2 diabetes mellitus without long-term insulin use - A1c on this admission 6.6 indicating good glycemic control - Continue lantus to 20 units a day and continue novolog 4 units tidac and SSI  Diabetic neuropathy - On gabapentin  Dyslipidemia associated with type 2 diabetes mellitus - On Lipitor 40 mg at bedtime  Essential hypertension - Continue carvedilol 10 mg daily, isosorbide dinitrate 20 mg twice daily, lisinopril 20 mg daily, hydralazine 25 mg PO TID  Depression - Continue Zoloft 100 mg daily  Low TSH - Has had TSH 0.167 about 3 years ago but she says she is not aware of this - TSH on this admission 0.112 - Will set up endo appt  for her on discharge    DVT prophylaxis: Lovenox subcutaneous Code Status: full code  Family Communication: No family at the bedside Disposition Plan: 2/4 if cough improves    Consultants:   Diabetic coordinator  Procedures:   None  Antimicrobials:   Levaquin 05/22/2016 -->   Subjective: Coughing a lot.   Objective: Vitals:   05/26/16 2100 05/27/16 0520 05/27/16 0902 05/27/16 0931  BP: 140/70 137/70 118/85   Pulse: 71 74 83   Resp: 16 16 17    Temp: 98.4 F (36.9 C) 97.6 F (36.4 C) 98 F (36.7 C)   TempSrc: Oral Oral Oral   SpO2: 95% 96% 98% 96%  Weight: 103.1 kg (227 lb 4.8 oz)     Height:        Intake/Output Summary (Last 24 hours) at 05/27/16 1358 Last data filed at 05/27/16 1004  Gross per 24 hour  Intake              360 ml  Output             1000 ml  Net             -640 ml   Filed Weights   05/24/16 2143 05/25/16 2051 05/26/16 2100  Weight: 102.2 kg (225 lb 4.8 oz) 102.6 kg (226 lb 1.6 oz) 103.1 kg (227 lb 4.8 oz)    Examination:  General exam: Coughing a lot, no distress   Respiratory system: cant take deep breaths due to cough flare  Cardiovascular system: S1 & S2 heard, Rate controlled Gastrointestinal system: (+) BS, non tender, obese, non distended  Central nervous system: Nonfocal  Extremities: No tenderness, palpable pulses bilaterally  Skin: No lesions, no ulcers  Psychiatry: Normal mood and behavior   Data Reviewed: I have personally reviewed following labs and imaging studies  CBC:  Recent Labs Lab 05/21/16 0800 05/21/16 1020 05/22/16 0358 05/23/16 0629 05/25/16 0553 05/26/16 0709  WBC 6.0 5.5 4.6 9.8 10.6* 9.9  NEUTROABS 1.7  --   --   --   --   --   HGB 11.9* 11.3* 12.0 11.7* 11.9* 12.2  HCT 37.0 35.7* 37.2 37.5 37.1 38.0  MCV 86.7 87.3 86.7 88.2 86.5 86.2  PLT 185 185 209 223 214 [id number]   Basic Metabolic Panel:  Recent Labs Lab 05/21/16 0800 05/21/16 1020 05/22/16 0358 05/23/16 0629 05/25/16 0553  05/26/16 0709  NA 142  --  139 139 139 137  K 3.6  --  3.4* 4.4 4.0 3.6  CL 107  --  105 106 103 102  CO2 27  --  26 28 29 27   GLUCOSE 148*  --  236* 311* 339* 202*  BUN 8  --  10 15 22* 18  CREATININE 0.90 0.84 0.98 0.96 0.92 0.95  CALCIUM 8.8*  --  8.9 8.9 9.0 8.5*  MG  --  1.9  --   --   --   --    GFR: Estimated Creatinine Clearance: 71.5 mL/min (by C-G formula based on SCr of 0.95 mg/dL). Liver Function Tests:  Recent Labs Lab 05/22/16 0913 05/23/16 0629  AST 28 22  ALT 32 26  ALKPHOS 62 56  BILITOT 0.7 0.6  PROT 6.4* 6.1*  ALBUMIN 3.3* 3.2*    Recent Labs Lab 05/22/16 0913  LIPASE 27   No results for input(s): AMMONIA in the last 168 hours. Coagulation Profile: No results for input(s): INR, PROTIME in the last 168 hours. Cardiac Enzymes:  Recent Labs Lab 05/21/16 0800 05/21/16 1020 05/21/16 1756 05/21/16 2331  TROPONINI 0.05* 0.05* 0.05* 0.05*   BNP (last 3 results) No results for input(s): PROBNP in the last 8760 hours. HbA1C: No results for input(s): HGBA1C in the last 72 hours. CBG:  Recent Labs Lab 05/26/16 1141 05/26/16 1634 05/26/16 2047 05/27/16 0735 05/27/16 1127  GLUCAP 226* 176* 186* 172* 256*   Lipid Profile: No results for input(s): CHOL, HDL, LDLCALC, TRIG, CHOLHDL, LDLDIRECT in the last 72 hours. Thyroid Function Tests: No results for input(s): TSH, T4TOTAL, FREET4, T3FREE, THYROIDAB in the last 72 hours. Anemia Panel: No results for input(s): VITAMINB12, FOLATE, FERRITIN, TIBC, IRON, RETICCTPCT in the last 72 hours. Urine analysis:    Component Value Date/Time   COLORURINE YELLOW 12/14/2015 0627   APPEARANCEUR CLEAR 12/14/2015 0627   LABSPEC 1.021 12/14/2015 0627   PHURINE 6.0 12/14/2015 0627   GLUCOSEU NEGATIVE 12/14/2015 0627   HGBUR NEGATIVE 12/14/2015 0627   BILIRUBINUR NEGATIVE 12/14/2015 0627   KETONESUR NEGATIVE 12/14/2015 0627   PROTEINUR NEGATIVE 12/14/2015 0627   UROBILINOGEN 0.2 09/27/2014 1920   NITRITE  NEGATIVE 12/14/2015 0627   LEUKOCYTESUR NEGATIVE 12/14/2015 0627   Sepsis Labs: @LABRCNTIP (procalcitonin:4,lacticidven:4)   Recent Results (from the past 240 hour(s))  Culture, blood (routine x 2) Call MD if unable to obtain prior to antibiotics being given     Status: None   Collection Time: 05/21/16 10:35 AM  Result Value Ref Range Status   Specimen Description BLOOD LEFT FOREARM  Final   Special Requests IN PEDIATRIC BOTTLE [place]  Final   Culture NO GROWTH 5 DAYS  Final   Report Status 05/26/2016 FINAL  Final  Culture, blood (routine x 2) Call MD if unable to obtain prior to antibiotics being given     Status: None   Collection Time: 05/21/16 10:39 AM  Result Value Ref Range Status   Specimen Description LEFT ANTECUBITAL  Final   Special Requests BOTTLES DRAWN AEROBIC AND ANAEROBIC  [place]  Final   Culture NO GROWTH 5 DAYS  Final   Report Status 05/26/2016 FINAL  Final      Radiology Studies: Dg Chest 2 View Result Date: 05/21/2016 Patchy right upper lobe airspace disease, possible pneumonia.   Ct Angio Chest Pe W Or Wo Contrast Result Date: 05/21/2016 Right upper lobe consolidation compatible with pneumonia. No evidence of pulmonary embolus. Enlarged thyroid with probable right thyroid lobe nodule, suggesting multinodular goiter, not significantly changed since prior CT. This could be further evaluated with elective thyroid ultrasound. Electronically Signed   By: Rolm Baptise M.D.   On: 05/21/2016 12:00     Scheduled Meds: . aspirin  325 mg Oral Daily  . atorvastatin  40 mg Oral QHS  . budesonide (PULMICORT) nebulizer solution  0.25 mg Nebulization BID  . carvedilol  10 mg Oral Daily  . cyclobenzaprine  5 mg Oral BID  . enoxaparin (LOVENOX) injection  40 mg Subcutaneous Q24H  . fluticasone  1 spray Each Nare Daily  . furosemide  40 mg Oral Daily  . gabapentin  600 mg Oral TID  . guaiFENesin  1,200 mg Oral BID  . hydrALAZINE  25 mg Oral Q8H  . insulin aspart  0-15 Units  Subcutaneous TID WC  . insulin aspart  0-5 Units Subcutaneous QHS  . insulin aspart  4 Units Subcutaneous TID WC  . insulin glargine  20 Units Subcutaneous Daily  . isosorbide dinitrate  20 mg Oral BID  . levofloxacin  750 mg Oral Daily  . lisinopril  20 mg Oral Daily  . pantoprazole  40 mg Oral Daily  . sertraline  100 mg Oral Daily  . sodium chloride flush  3 mL Intravenous Q12H   Continuous Infusions:   LOS: 5 days    Time spent: 15 minutes  Greater than 50% of the time spent on counseling and coordinating the care.   Leisa Lenz, MD [facility] Pager [phone]  If 7PM-7AM, please contact night-coverage [email or web address] Password [email or web address] 05/27/2016, 1:58 PM

## 2016-05-28 DIAGNOSIS — J9601 Acute respiratory failure with hypoxia: Secondary | ICD-10-CM

## 2016-05-28 DIAGNOSIS — I10 Essential (primary) hypertension: Secondary | ICD-10-CM

## 2016-05-28 DIAGNOSIS — J189 Pneumonia, unspecified organism: Secondary | ICD-10-CM

## 2016-05-28 DIAGNOSIS — E119 Type 2 diabetes mellitus without complications: Secondary | ICD-10-CM

## 2016-05-28 DIAGNOSIS — I5030 Unspecified diastolic (congestive) heart failure: Secondary | ICD-10-CM

## 2016-05-28 LAB — GLUCOSE, CAPILLARY
Glucose-Capillary: 168 mg/dL — ABNORMAL HIGH (ref 65–99)
Glucose-Capillary: 185 mg/dL — ABNORMAL HIGH (ref 65–99)
Glucose-Capillary: 168 mg/dL — ABNORMAL HIGH (ref 65–99)

## 2016-05-28 MED ORDER — GUAIFENESIN ER 600 MG PO TB12: 1200.0000 mg | ORAL_TABLET | Freq: Two times a day (BID) | ORAL | 0 refills | Status: AC

## 2016-05-28 MED ORDER — LORATADINE 10 MG PO TABS
10.0000 mg | ORAL_TABLET | Freq: Every day | ORAL | Status: DC
  Administered 2016-05-28: 10 mg via ORAL
  Filled 2016-05-28: qty 1

## 2016-05-28 MED ORDER — LEVOFLOXACIN 750 MG PO TABS: 750.0000 mg | ORAL_TABLET | Freq: Every day | ORAL | 0 refills | Status: AC

## 2016-05-28 MED ORDER — BUDESONIDE-FORMOTEROL FUMARATE 160-4.5 MCG/ACT IN AERO: 2.0000 | INHALATION_SPRAY | Freq: Two times a day (BID) | RESPIRATORY_TRACT | 3 refills | Status: DC

## 2016-05-28 MED ORDER — IPRATROPIUM-ALBUTEROL 0.5-2.5 (3) MG/3ML IN SOLN: 3.0000 mL | Freq: Three times a day (TID) | RESPIRATORY_TRACT | 3 refills | Status: DC

## 2016-05-28 MED ORDER — FLUTICASONE PROPIONATE 50 MCG/ACT NA SUSP: 1.0000 | Freq: Every day | NASAL | 0 refills | Status: DC

## 2016-05-28 MED ORDER — LORATADINE 10 MG PO TABS
10.0000 mg | ORAL_TABLET | Freq: Every day | ORAL | 3 refills | Status: DC
Start: 2016-05-28 — End: 2017-11-20

## 2016-05-28 MED ORDER — GUAIFENESIN-DM 100-10 MG/5ML PO SYRP: 5.0000 mL | ORAL_SOLUTION | ORAL | 0 refills | Status: DC | PRN

## 2016-05-28 MED ORDER — MENTHOL 3 MG MT LOZG
1.0000 | LOZENGE | OROMUCOSAL | Status: DC | PRN
  Administered 2016-05-28: 3 mg via ORAL
  Filled 2016-05-28: qty 9

## 2016-05-28 MED ORDER — BENZONATATE 200 MG PO CAPS: 200.0000 mg | ORAL_CAPSULE | Freq: Three times a day (TID) | ORAL | 0 refills | Status: DC | PRN

## 2016-05-28 MED ORDER — HYDRALAZINE HCL 25 MG PO TABS: 25.0000 mg | ORAL_TABLET | Freq: Three times a day (TID) | ORAL | 0 refills | Status: DC

## 2016-05-28 NOTE — Discharge Summary (Signed)
Physician Discharge Summary  [name] [id number] DOB: 1961/02/07 DOA: 05/21/2016  PCP: Velna Hatchet, MD  Admit date: 05/21/2016 Discharge date: 05/28/2016  Time spent: 65 minutes  Recommendations for Outpatient Follow-up:  1. Follow-up with Velna Hatchet, MD in 1-2 weeks. On follow-up patient's blood pressure needs to be reassessed as hydralazine was added to patient's blood pressure regimen for better blood pressure control. Patient's pneumonia need to be followed up upon. Patient will likely need repeat thyroid function studies done in about 4 weeks as patient was noted to have an abnormal TSH during this hospitalization.   Discharge Diagnoses:  Principal Problem:   Acute respiratory failure with hypoxia ([place]) Active Problems:   CAP (community acquired pneumonia)   Dyslipidemia   Morbid obesity-BMI 45   Diastolic CHF ([place])   DM2 (diabetes mellitus, type 2) ([place])   HTN (hypertension)   Community acquired pneumonia   Discharge Condition: Stable and improved.  Diet recommendation: Carb modified diet.  Filed Weights   05/25/16 2051 05/26/16 2100 05/27/16 2043  Weight: 102.6 kg (226 lb 1.6 oz) 103.1 kg (227 lb 4.8 oz) 103.1 kg (227 lb 3.2 oz)    History of present illness:  Per Dr Golden Circle is a 56 y.o. female with medical history significant of hypertension, diabetes mellitus, hyperlipidemia, grade 1 diastolic dysfunction presented to the emergency room with complaints of progressive shortness of breath, cough with bloody tinged sputum, chest pain to the back and both shoulders and abdominal pain.. Patient reported that approximately 2-3 weeks ago she was diagnosed with strep throat by positive red strep test. She had been given antibiotic the name of which she does not remember, but believes it was a lower dose penicillin because she is allergic to penicillin because she is allergic to penicillins.  Overall she was and feeling a little better  but the last few days she's had more shortness of breath, which had progressively worsening, in addition she developed chest pain with pleuritic component,. She was still coughing and now the sputum was bloody tinge. Patient also c/o feeling lijke she was going to pass out on few occasions. She denied fevers, complained of  headache. Overall she feels like she had been run down by a truck and stated stated that she ached all over.  ED Course: The emergency department she was afebrile, sinus are stable, with count normal, troponin was 0.05, EKG without acute abnormalities, normal sinus rhythm Chest x-ray revealed right upper lobe infiltrate Patient underwent echocardiogram and coronary angiography in June 2016. Echocardiography revealed EF of 55-60% and mild grade 1 diastolic dysfunction, coronary angiography revealed clean coronafries  Hospital Course:  Acute respiratory failure with hypoxia / lobar pneumonia, unspecified organism - On admission, chest x-ray showed patchy right upper lobe airspace disease concerning for pneumonia - CT Angio chest showed right upper lobe consolidation compatible with pneumonia, no evidence of pulmonary embolism - Patient was placed on IV Levaquin, nebulizer treatments, Mucinex. Blood cultures obtained were negative to date. Pulmicort was added to patient's regimen and patient was initially on steroids which was subsequently stopped as her wheezing improved. Patient was also maintained on antitussives. - Patient improved clinically and was subsequently transitioned to oral Levaquin. - Patient be discharged home on 4 more days of oral Levaquin to complete a ten-day course of antibiotic treatment. Outpatient follow-up.  Chronic diastolic CHF - Compensated - Continued on home regimen of Lasix 40 mg daily  Controlled type 2 diabetes mellitus without long-term insulin use -  A1c on this admission 6.6 indicating good glycemic control - Patient was maintained on Lantus  20 units daily as well as meal coverage insulin NovoLog 4 units 3 times daily as well as sliding scale insulin. Patient's blood sugars were controlled during the hospitalization. Patient with discharge home back on her oral hypoglycemic agents and will need outpatient follow-up with PCP.   Diabetic neuropathy - On gabapentin  Dyslipidemia associated with type 2 diabetes mellitus - Patient was maintained on home regimen Lipitor.   Essential hypertension - Patient was initially maintained on home regimen of carvedilol 10 mg daily, isosorbide dinitrate 20 mg twice daily, lisinopril 20 mg daily. Hydralazine 25 mg PO TID was added to blood pressure regimen with better blood pressure control. Outpatient follow-up.  Depression - Continued on home regimen of Zoloft 100 mg daily  Low TSH - Has had TSH 0.167 about 3 years ago but she says she is not aware of this - TSH on this admission 0.112 - Outpatient follow-up with PCP and will need repeat thyroid function studies done.    Procedures:  2-D echo 05/22/2016--EF 55-60%. Moderate diastolic dysfunction. No significant valvular abnormalities. Normal right ventricular size and systolic function.  CT angiogram chest 05/21/2016  Chest x-ray 05/21/2016    Consultations:  None  Discharge Exam: Vitals:   05/28/16 0500 05/28/16 0858  BP: (!) 124/51 125/71  Pulse: 82 87  Resp: 17 18  Temp: 97.8 F (36.6 C) 97.8 F (36.6 C)    General: NAD Cardiovascular: RRR Respiratory: CTAB  Discharge Instructions   Discharge Instructions    Diet Carb Modified    Complete by:  As directed    Increase activity slowly    Complete by:  As directed      Current Discharge Medication List    START taking these medications   Details  benzonatate (TESSALON) 200 MG capsule Take 1 capsule (200 mg total) by mouth 3 (three) times daily as needed for cough (cough unrelieved by robitussin). Qty: 20 capsule, Refills: 0    budesonide-formoterol  (SYMBICORT) 160-4.5 MCG/ACT inhaler Inhale 2 puffs into the lungs 2 (two) times daily. Qty: 1 Inhaler, Refills: 3    fluticasone (FLONASE) 50 MCG/ACT nasal spray Place 1 spray into both nostrils daily. Qty: 16 g, Refills: 0    guaiFENesin (MUCINEX) 600 MG 12 hr tablet Take 2 tablets (1,200 mg total) by mouth 2 (two) times daily. Take for 4 days then stop. Qty: 16 tablet, Refills: 0    guaiFENesin-dextromethorphan (ROBITUSSIN DM) 100-10 MG/5ML syrup Take 5 mLs by mouth every 4 (four) hours as needed for cough. Qty: 118 mL, Refills: 0    hydrALAZINE (APRESOLINE) 25 MG tablet Take 1 tablet (25 mg total) by mouth every 8 (eight) hours. Qty: 90 tablet, Refills: 0    ipratropium-albuterol (DUONEB) 0.5-2.5 (3) MG/3ML SOLN Take 3 mLs by nebulization 3 (three) times daily. Use 3 times daily x 4 days, then every 4-6 hours as needed. Qty: 360 mL, Refills: 3    levofloxacin (LEVAQUIN) 750 MG tablet Take 1 tablet (750 mg total) by mouth daily. Take for 4 days then stop. Qty: 4 tablet, Refills: 0      CONTINUE these medications which have CHANGED   Details  loratadine (CLARITIN) 10 MG tablet Take 1 tablet (10 mg total) by mouth daily. Qty: 30 tablet, Refills: 3      CONTINUE these medications which have NOT CHANGED   Details  albuterol (PROVENTIL,VENTOLIN) 90 MCG/ACT inhaler Inhale 2 puffs into  the lungs every 4 (four) hours as needed for shortness of breath.     aspirin 325 MG tablet Take 325 mg by mouth daily.    atorvastatin (LIPITOR) 40 MG tablet Take 40 mg by mouth at bedtime.     carvedilol (COREG CR) 10 MG 24 hr capsule Take 10 mg by mouth daily.    cyclobenzaprine (FLEXERIL) 5 MG tablet Take 5 mg by mouth 2 (two) times daily.     furosemide (LASIX) 40 MG tablet Take 40 mg by mouth daily.    gabapentin (NEURONTIN) 300 MG capsule Take 2 capsules (600 mg total) by mouth 3 (three) times daily. Qty: 180 capsule, Refills: 2    isosorbide dinitrate (ISORDIL) 20 MG tablet Take 20 mg by  mouth 2 (two) times daily.     KLOR-CON M10 10 MEQ tablet Take 10 mEq by mouth 2 (two) times daily.     KOMBIGLYZE XR 5-500 MG TB24 Take 1 tablet by mouth daily.     lisinopril (PRINIVIL,ZESTRIL) 10 MG tablet Take 2 tablets by mouth daily.     nitroGLYCERIN (NITROSTAT) 0.4 MG SL tablet Place 0.4 mg under the tongue every 5 (five) minutes as needed. For chest pain.    pantoprazole (PROTONIX) 40 MG tablet Take 1 tablet (40 mg total) by mouth daily. Qty: 30 tablet, Refills: 0    sertraline (ZOLOFT) 100 MG tablet Take 100 mg by mouth daily.        Allergies  Allergen Reactions  . Hydrocodone Other (See Comments)    Causes Hallucinations  . Penicillins Swelling    Has patient had a PCN reaction causing immediate rash, facial/tongue/throat swelling, SOB or lightheadedness with hypotension: YES Has patient had a PCN reaction causing severe rash involving mucus membranes or skin necrosis: NO Has patient had a PCN reaction that required hospitalization NO Has patient had a PCN reaction occurring within the last 10 years: NO If all of the above answers are "NO", then may proceed with Cephalosporin use.  . Sulfa Antibiotics Diarrhea, Itching and Rash   Follow-up Information    HOLWERDA, SCOTT, MD. Schedule an appointment as soon as possible for a visit in 1 week(s).   Specialty:  Internal Medicine Why:  f/u in 1-2 weeks. Contact information: [address] [place]  [address] [phone]            The results of significant diagnostics from this hospitalization (including imaging, microbiology, ancillary and laboratory) are listed below for reference.    Significant Diagnostic Studies: Dg Chest 2 View  Result Date: 05/21/2016 CLINICAL DATA:  Cough EXAM: CHEST  2 VIEW COMPARISON:  12/14/2015 FINDINGS: Interval development of patchy airspace disease right upper lobe, probable pneumonia. Negative for heart failure or effusion. Peribronchial thickening may reflect chronic lung  disease. Heart size upper normal. IMPRESSION: Patchy right upper lobe airspace disease, possible pneumonia. Electronically Signed   By: Franchot Gallo M.D.   On: 05/21/2016 08:17   Ct Angio Chest Pe W Or Wo Contrast  Result Date: 05/21/2016 CLINICAL DATA:  Cough, chest pain, shortness of Breath EXAM: CT ANGIOGRAPHY CHEST WITH CONTRAST TECHNIQUE: Multidetector CT imaging of the chest was performed using the standard protocol during bolus administration of intravenous contrast. Multiplanar CT image reconstructions and MIPs were obtained to evaluate the vascular anatomy. CONTRAST:  80 cc Isovue 370 IV COMPARISON:  05/24/2014 FINDINGS: Cardiovascular: No filling defects in the pulmonary arteries to suggest pulmonary emboli. Insert Heart Mediastinum/Nodes: No mediastinal, hilar, or axillary adenopathy. Thyroid is enlarged  with probable 2.8 cm nodule in the right thyroid lobe. Findings similar to prior study. Lungs/Pleura: Airspace disease in the right upper lobe compatible with pneumonia. Lungs otherwise clear. No effusions. Upper Abdomen: Imaging into the upper abdomen shows no acute findings. Musculoskeletal: Chest wall soft tissues are unremarkable. No acute bony abnormality. Review of the MIP images confirms the above findings. IMPRESSION: Right upper lobe consolidation compatible with pneumonia. No evidence of pulmonary embolus. Enlarged thyroid with probable right thyroid lobe nodule, suggesting multinodular goiter, not significantly changed since prior CT. This could be further evaluated with elective thyroid ultrasound. Electronically Signed   By: Rolm Baptise M.D.   On: 05/21/2016 12:00    Microbiology: Recent Results (from the past 240 hour(s))  Culture, blood (routine x 2) Call MD if unable to obtain prior to antibiotics being given     Status: None   Collection Time: 05/21/16 10:35 AM  Result Value Ref Range Status   Specimen Description BLOOD LEFT FOREARM  Final   Special Requests IN PEDIATRIC  BOTTLE 1CC  Final   Culture NO GROWTH 5 DAYS  Final   Report Status 05/26/2016 FINAL  Final  Culture, blood (routine x 2) Call MD if unable to obtain prior to antibiotics being given     Status: None   Collection Time: 05/21/16 10:39 AM  Result Value Ref Range Status   Specimen Description LEFT ANTECUBITAL  Final   Special Requests BOTTLES DRAWN AEROBIC AND ANAEROBIC  [place]  Final   Culture NO GROWTH 5 DAYS  Final   Report Status 05/26/2016 FINAL  Final     Labs: Basic Metabolic Panel:  Recent Labs Lab 05/22/16 0358 05/23/16 0629 05/25/16 0553 05/26/16 0709  NA 139 139 139 137  K 3.4* 4.4 4.0 3.6  CL 105 106 103 102  CO2 26 28 29 27   GLUCOSE 236* 311* 339* 202*  BUN 10 15 22* 18  CREATININE 0.98 0.96 0.92 0.95  CALCIUM 8.9 8.9 9.0 8.5*   Liver Function Tests:  Recent Labs Lab 05/22/16 0913 05/23/16 0629  AST 28 22  ALT 32 26  ALKPHOS 62 56  BILITOT 0.7 0.6  PROT 6.4* 6.1*  ALBUMIN 3.3* 3.2*    Recent Labs Lab 05/22/16 0913  LIPASE 27   No results for input(s): AMMONIA in the last 168 hours. CBC:  Recent Labs Lab 05/22/16 0358 05/23/16 0629 05/25/16 0553 05/26/16 0709  WBC 4.6 9.8 10.6* 9.9  HGB 12.0 11.7* 11.9* 12.2  HCT 37.2 37.5 37.1 38.0  MCV 86.7 88.2 86.5 86.2  PLT 209 223 214 191   Cardiac Enzymes:  Recent Labs Lab 05/21/16 1756 05/21/16 2331  TROPONINI 0.05* 0.05*   BNP: BNP (last 3 results) No results for input(s): BNP in the last 8760 hours.  ProBNP (last 3 results) No results for input(s): PROBNP in the last 8760 hours.  CBG:  Recent Labs Lab 05/27/16 1127 05/27/16 1628 05/27/16 2040 05/28/16 0745 05/28/16 1142  GLUCAP 256* 167* 157* 168* 185*       Signed:  Natonya Finstad MD.  [facility] 05/28/2016, 4:20 PM

## 2016-05-28 NOTE — Progress Notes (Signed)
Reviewed discharge instructions and medications with patient. All questions answered. Printed prescriptions given to patient. This RN left message with case management about home nebulizer; informed patient that case manager would contact home health on Monday and would follow up with her. Patient verbalizes understanding. Assessment is as charted. Patient is leaving in stable condition via wheelchair.

## 2016-06-05 ENCOUNTER — Ambulatory Visit: Payer: Medicare Other

## 2016-06-29 ENCOUNTER — Ambulatory Visit (INDEPENDENT_AMBULATORY_CARE_PROVIDER_SITE_OTHER): Payer: Medicare Other | Admitting: Orthopedic Surgery

## 2016-06-29 ENCOUNTER — Encounter (INDEPENDENT_AMBULATORY_CARE_PROVIDER_SITE_OTHER): Payer: Self-pay | Admitting: Orthopedic Surgery

## 2016-06-29 ENCOUNTER — Ambulatory Visit (INDEPENDENT_AMBULATORY_CARE_PROVIDER_SITE_OTHER): Payer: Medicare Other

## 2016-06-29 DIAGNOSIS — M1711 Unilateral primary osteoarthritis, right knee: Secondary | ICD-10-CM | POA: Diagnosis not present

## 2016-06-29 DIAGNOSIS — M542 Cervicalgia: Secondary | ICD-10-CM | POA: Diagnosis not present

## 2016-06-29 DIAGNOSIS — M1712 Unilateral primary osteoarthritis, left knee: Secondary | ICD-10-CM | POA: Diagnosis not present

## 2016-06-29 DIAGNOSIS — M79601 Pain in right arm: Secondary | ICD-10-CM | POA: Insufficient documentation

## 2016-06-30 ENCOUNTER — Other Ambulatory Visit (INDEPENDENT_AMBULATORY_CARE_PROVIDER_SITE_OTHER): Payer: Self-pay | Admitting: Orthopedic Surgery

## 2016-06-30 DIAGNOSIS — M1712 Unilateral primary osteoarthritis, left knee: Secondary | ICD-10-CM

## 2016-07-02 NOTE — Progress Notes (Signed)
Office Visit Note   Patient: [name]           Date of Birth: 13-Aug-1960           MRN: [id number] Visit Date: 06/29/2016 Requested by: Velna Hatchet, MD [place], [place] [address] PCP: Velna Hatchet, MD  Subjective: Chief Complaint  Patient presents with  . Left Knee - Pain  . Neck - Pain  . Right Arm - Pain    HPI: [name] is a 56 year old patient with right shoulder and arm pain which radiates all the way down her arm.:  When she gets any relief is to hold her arm up overhead.  She says her neck is okay she reports some stiffness with range of motion.  Patient also describes pretty significant in and incapacitating left knee pain..  Feels like it is bone on bone.  It catches and sticks and pops a lot.  She has a history of prior left knee arthroscopy.  She describes nighttime pain and diminished walking endurance.  She states her right thumb is doing well following ultrasound-guided injection 02/25/2016.  She had an aspiration and injection of the left knee at that same time which helped but now the pain is recurred.  She is seen in the dentist.  She works as a Company secretary and her knee pain is hindering her in this regard.  The patient did have a cervical spine MRI in 2016 which does show bulging disc at the C3-4 level.  She has no stairs in her house but does live alone but has good social support network.  No family history or personal history of deep vein thrombosis or pulmonary embolism              ROS: All systems reviewed are negative as they relate to the chief complaint within the history of present illness.  Patient denies  fevers or chills.   Assessment & Plan: Visit Diagnoses:  1. Right arm pain   2. Cervicalgia   3. Primary osteoarthritis of left knee     Plan: Impression is right shoulder radiculopathy involving the arm and neck.  I will send her to Dr. Ernestina Patches for epidural steroid injection for that problem.  She takes gabapentin for that  problem.  In regards to the left knee she has end-stage arthritis with failure of conservative management.  I think knee replacement is going be her only predictable option for pain relief and increased quality of life.  However she is on the heavy side for that for seizure and she is on the young side for that procedure.  The increased risk associated with increased body mass index are discussed this along with the longevity of the prosthesis.  Reasonable likelihood in her lifetime that it would have to be revised.  Plan at this time is for left total knee replacement.  Patient understands the risks and benefits including but not limited to infection nerve vessel damage knee stiffness incomplete pain relief as well as the extensive nature of the rehabilitation which will be required.  All questions are answered.  Follow-Up Instructions: No Follow-up on file.   Orders:  Orders Placed This Encounter  Procedures  . XR KNEE 3 VIEW LEFT  . Ambulatory referral to Physical Medicine Rehab   No orders of the defined types were placed in this encounter.     Procedures: No procedures performed   Clinical Data: No additional findings.  Objective: Vital Signs: There were no vitals taken  for this visit.  Physical Exam:   Constitutional: Patient appears well-developed HEENT:  Head: Normocephalic Eyes:EOM are normal Neck: Normal range of motion Cardiovascular: Normal rate Pulmonary/chest: Effort normal Neurologic: Patient is alert Skin: Skin is warm Psychiatric: Patient has normal mood and affect    Ortho Exam: Orthopedic exam demonstrates full active and passive range of motion of both arms.  She has reasonable range of motion of the neck but with some reproduction of symptoms with rotation to the right.  Patient has 5+ out of 5 grip EPL FPL interosseous wrist flexion-extension biceps triceps and deltoid strength with palpable radial pulse.  No other masses lymph adenopathy or skin changes  noted in the shoulder girdle or neck region.  Examination of the left knee demonstrates about a 5 flexion contracture extensor mechanism is intact collateral crucial ligaments are stable pedal pulses palpable medial joint line tenderness is present.  There is no groin pain with internal/external rotation of the leg in the skin is intact.  Specialty Comments:  No specialty comments available.  Imaging: No results found.   PMFS History: Patient Active Problem List   Diagnosis Date Noted  . Right arm pain 06/29/2016  . Cervicalgia 06/29/2016  . Primary osteoarthritis of left knee 06/29/2016  . Acute respiratory failure with hypoxia ([place]) 05/28/2016  . CAP (community acquired pneumonia) 05/22/2016  . Community acquired pneumonia 05/21/2016  . Hypersomnia with sleep apnea 11/18/2015  . Lethargy 11/18/2015  . Chest pain 09/30/2014  . Abnormal cardiac function test 09/29/2014  . Nausea with vomiting   . Chest pain with moderate risk for cardiac etiology 09/28/2014  . HTN (hypertension) 09/28/2014  . Hypokalemia   . Paresthesia 06/18/2014  . Numbness and tingling of left arm and leg   . Unstable angina ([place]) 05/24/2014  . OSA on CPAP 05/24/2014  . DM2 (diabetes mellitus, type 2) ([place]) 10/29/2012  . S/P laparoscopic appendectomy 06/04/2012  . Acute appendicitis with generalized peritonitis 06/04/2012  . Diastolic CHF ([place]) 37/01/6268  . Mediastinal mass 01/09/2012  . Hypotension 01/08/2012  . SOB (shortness of breath) 06/20/2011  . Mediastinal abnormality 06/20/2011  . Dyslipidemia 12/23/2009  . GLAUCOMA 12/23/2009  . ARTHRITIS 12/23/2009  . 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 Morbid obesity-BMI 45 09/13/2006  . ANXIETY STATE NOS 09/13/2006  . DISORDER, DEPRESSIVE NEC 09/13/2006  . CARPAL TUNNEL SYNDROME, MILD 09/13/2006  . Unspecified essential hypertension 09/13/2006  . IBS 09/13/2006  . DEGENERATION, LUMBAR/LUMBOSACRAL DISC 09/13/2006  . SYMPTOM, SWELLING/MASS/LUMP IN CHEST  09/13/2006  . PULMONARY EMBOLISM, HX OF 09/13/2006   Past Medical History:  Diagnosis Date  . Anginal pain ([place])    a. NL cath in 2008;  b. Myoview 03/2011: dec uptake along mid anterior wall on stress imaging -> ? attenuation vs. ischemia, EF 65%;  c. Echo 04/2011: EF 55-60%, no RWMA, Gr 2 dd  . Anxiety   . Asthma   . Bone cancer ([place])   . Cancer ([place])   . CHF (congestive heart failure) ([place])   . CHF (congestive heart failure) ([place])   . Depression   . Diabetes mellitus   . Hypertension   . 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 Obesity   . Pulmonary edema   . Pulmonary embolism ([place])    a. 2008 -> coumadin x 6 mos.  . Sleep apnea     Family History  Problem Relation Age of Onset  . Emphysema Mother   . Arthritis Mother   .  Heart failure Mother     alive @ 26  . Asthma Brother   . Heart disease Father     died @ 39's.  . Stroke Father   . Diabetes Sister   . Heart disease Paternal Grandfather   . Colon cancer Maternal Grandfather     Past Surgical History:  Procedure Laterality Date  . ABDOMINAL HYSTERECTOMY  2005  . CARDIAC CATHETERIZATION     Normal  . CARDIAC CATHETERIZATION N/A 09/30/2014   Procedure: Left Heart Cath and Coronary Angiography;  Surgeon: Sherren Mocha, MD;  Location: [facility];  Service: Cardiovascular;  Laterality: N/A;  . LAPAROSCOPIC APPENDECTOMY N/A 06/03/2012   Procedure: APPENDECTOMY LAPAROSCOPIC;  Surgeon: Stark Klein, MD;  Location: [facility];  Service: General;  Laterality: N/A;  . Left knee surgery  2008  . LEG SURGERY    . TUBAL LIGATION  1989   Social History   Occupational History  . UNEMPLOYED Disabled   Social History Main Topics  . Smoking status: Former Smoker    Packs/day: 0.50    Years: 15.00    Types: Cigarettes    Quit date: 04/24/1985  . Smokeless tobacco: Never Used  . Alcohol use No  . Drug use: No  . Sexual activity: No

## 2016-07-10 ENCOUNTER — Institutional Professional Consult (permissible substitution) (INDEPENDENT_AMBULATORY_CARE_PROVIDER_SITE_OTHER): Payer: Medicare Other | Admitting: Physical Medicine and Rehabilitation

## 2016-07-19 ENCOUNTER — Telehealth (INDEPENDENT_AMBULATORY_CARE_PROVIDER_SITE_OTHER): Payer: Self-pay | Admitting: Orthopedic Surgery

## 2016-07-19 NOTE — Telephone Encounter (Signed)
Returned call to patient left message on voicemail concerning appointment with Dr Marlou Sa. [phone]

## 2016-07-20 ENCOUNTER — Ambulatory Visit
Admission: RE | Admit: 2016-07-20 | Discharge: 2016-07-20 | Disposition: A | Discharge status: Home / Self Care | Payer: Medicare Other | Source: Ambulatory Visit | Attending: Internal Medicine | Admitting: Internal Medicine

## 2016-07-20 ENCOUNTER — Other Ambulatory Visit: Payer: Self-pay | Admitting: Internal Medicine

## 2016-07-20 DIAGNOSIS — Z1231 Encounter for screening mammogram for malignant neoplasm of breast: Secondary | ICD-10-CM

## 2016-07-25 ENCOUNTER — Encounter (INDEPENDENT_AMBULATORY_CARE_PROVIDER_SITE_OTHER): Payer: Self-pay | Admitting: Physical Medicine and Rehabilitation

## 2016-07-25 ENCOUNTER — Ambulatory Visit (INDEPENDENT_AMBULATORY_CARE_PROVIDER_SITE_OTHER): Payer: Medicare Other | Admitting: Physical Medicine and Rehabilitation

## 2016-07-25 VITALS — BP 138/90 | HR 85

## 2016-07-25 DIAGNOSIS — M5412 Radiculopathy, cervical region: Secondary | ICD-10-CM

## 2016-07-25 DIAGNOSIS — M5010 Cervical disc disorder with radiculopathy, unspecified cervical region: Secondary | ICD-10-CM | POA: Diagnosis not present

## 2016-07-25 DIAGNOSIS — F411 Generalized anxiety disorder: Secondary | ICD-10-CM | POA: Diagnosis not present

## 2016-07-25 MED ORDER — HYDROXYZINE PAMOATE 50 MG PO CAPS: ORAL_CAPSULE | ORAL | 1 refills | Status: DC

## 2016-07-25 NOTE — Progress Notes (Signed)
[name] - 56 y.o. female MRN [id number]  Date of birth: 12-Aug-1960  Office Visit Note: Visit Date: 07/25/2016 PCP: Velna Hatchet, MD Referred by: Velna Hatchet, MD  Subjective: Chief Complaint  Patient presents with  . Neck - Pain   HPI: [name] is a 56 year old female that I last saw on 2014. At the time she had MRI findings of multilevel uncovertebral joint and facet arthropathy giving her foraminal stenosis more on the left at C4-5 and C5-6  but more on the right at C6-7. At the time she was followed by Dr. Louanne Skye. We ended up completing a cervical epidural injection with good relief of her symptoms. 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 She did not follow up with Dr. Louanne Skye after that. She has been seeing Dr. Marlou Sa recently. She's been having again right-sided posterior neck pain that radiates down the posterior lateral arm and that they dorsal forearm and into the radial side of the hand. She does have some tingling in the right hand and this seems to be more of a C6 type distribution. She reports that she does get some worsening lying down or sitting for long periods but not specifically worse at night. She gets some relief holding her arm above her shoulder height. She denies any left-sided complaints. She does not feel that she's had specific weakness although she has felt weakness at times in the arm. She is right-hand dominant. She's had physical therapy in the past. She's had medication management with anti-inflammatories and muscle relaxers. She rates her pain is quite severe at this point. She has had an updated MRI in 2016 since I have seen her. This MRI is reviewed below. It does not show much in the way of any difference from 2014. Her case is complicated by history of multiple medical problems including diabetes and heart disease which is  quite significant. She's had a history of cancer. She also has a history of depression and anxiety.    Review of Systems  Constitutional: Negative for chills, fever, malaise/fatigue and weight loss.  HENT: Negative for hearing loss and sinus pain.   Eyes: Negative for blurred vision, double vision and photophobia.  Respiratory: Negative for cough and shortness of breath.   Cardiovascular: Negative for chest pain, palpitations and leg swelling.  Gastrointestinal: Negative for abdominal pain, nausea and vomiting.  Genitourinary: Negative for flank pain.  Musculoskeletal: Positive for neck pain. Negative for myalgias.  Skin: Negative for itching and rash.  Neurological: Positive for tingling. Negative for tremors, focal weakness and weakness.  Endo/Heme/Allergies: Negative.   Psychiatric/Behavioral: Negative for depression.  All other systems reviewed and are negative.  Otherwise per HPI.  Assessment & Plan: Visit Diagnoses:  1. Cervical radiculopathy   2. Cervical disc disorder with radiculopathy   3. Pre-operative anxiety     Plan: Findings:  Chronic history with recent three-month history of progressive right radicular arm pain and neck pain. It seems to be more of a C6 or C7 distribution. This does not seem like carpal tunnel syndrome although she had electrodiagnostic study several years ago showing mild median neuropathy of the wrist. She got relief in the past with C7-T1 interlaminar epidural injection with fluoroscopic guidance and I think we should repeat this diagnostically and hopefully therapeutically. She does want to do this. We will provide hydroxyzine as preprocedure sedation for her  as she is somewhat anxious. Depending on the relief she gets we would regroup with physical therapy and follow up with Dr. Marlou Sa. She does report high worsening leg pain and knee pain and she is following up with him.    Meds & Orders:  Meds ordered this encounter  Medications  . hydrOXYzine  (VISTARIL) 50 MG capsule    Sig: Take 1 PO by mouth 2 hours prior to procedure, may repeat if needed.    Dispense:  20 capsule    Refill:  1   No orders of the defined types were placed in this encounter.   Follow-up: Return for Right C7-T1 interlaminar epidural steroid injection.   Procedures: No procedures performed  No notes on file   Clinical History: Cervical spine MR 05/30/1014 There is reversal of the normal cervical lordosis with apex at C4-5. Trace anterolisthesis of C4 on C5 present. Overall, alignment is stable from previous MRI from 2014. Vertebral body heights are maintained. Signal intensity within the vertebral body bone marrow is normal. No focal osseous lesion  C4-5: Left eccentric circumferential degenerative disc osteophyte complex with left-sided uncovertebral spurring and mild left facet hypertrophy. There is resultant moderate to severe left foraminal narrowing. Mild effacement of the left ventral thecal sac present without significant canal stenosis. Mild right foraminal narrowing present as well.  C5-6: Left eccentric degenerative disc osteophyte with uncovertebral hypertrophy and facet arthrosis. There is fairly severe left sided foraminal narrowing. Mild to moderate right foraminal stenosis present as well. No central canal stenosis.  C6-7: Mild diffuse degenerative disc osteophyte complex, a centric to the right, with right-sided uncovertebral spurring and hypertrophy. There is moderate to severe right foraminal stenosis with mild left foraminal narrowing. No significant central canal stenosis.  C7-T1: Mild left-sided facet hypertrophy present. No significant canal or foraminal stenosis.  Overall very similar findings to 2014  She reports that she quit smoking about 31 years ago. Her smoking use included Cigarettes. She has a 7.50 pack-year smoking history. She has never used smokeless tobacco.   Recent Labs  05/21/16 1132  HGBA1C 6.6*     Objective:  VS:  HT:    WT:   BMI:     BP:138/90  HR:85bpm  TEMP: ( )  RESP:  Physical Exam  Constitutional: She is oriented to person, place, and time. She appears well-developed and well-nourished.  Eyes: Conjunctivae and EOM are normal. Pupils are equal, round, and reactive to light.  Neck: Neck supple. No tracheal deviation present.  Cardiovascular: Normal rate and intact distal pulses.   Pulmonary/Chest: Effort normal.  Musculoskeletal:  Cervical range of motion is limited in ranges with rotation and extension. She has an equivocal Spurling's test to the right. She has no focal triggerpoints at least reproduce any of her pain pattern. She has no shoulder impingement sign. She has good upper body strength bilaterally and symmetric. She has subjective decreased sensation to light touch in a C6 dermatome. She has a negative Tinel's and negative Phalen's test bilaterally. She has a negative Hoffmann's.  Lymphadenopathy:    She has no cervical adenopathy.  Neurological: She is alert and oriented to person, place, and time. She exhibits normal muscle tone. Coordination normal.  Skin: Skin is warm and dry. No rash noted. No erythema.  Psychiatric: She has a normal mood and affect. Her behavior is normal.  Nursing note and vitals reviewed.   Ortho Exam Imaging: No results found.  Past Medical/Family/Surgical/Social History: Medications & Allergies reviewed per EMR Patient  Active Problem List   Diagnosis Date Noted  . Cervical radiculopathy 07/26/2016  . Cervical disc disorder with radiculopathy 07/26/2016  . Right arm pain 06/29/2016  . Cervicalgia 06/29/2016  . Primary osteoarthritis of left knee 06/29/2016  . Acute respiratory failure with hypoxia ([place]) 05/28/2016  . CAP (community acquired pneumonia) 05/22/2016  . Community acquired pneumonia 05/21/2016  . Hypersomnia with sleep apnea 11/18/2015  . Lethargy 11/18/2015  . Chest pain 09/30/2014  . Abnormal cardiac  function test 09/29/2014  . Nausea with vomiting   . Chest pain with moderate risk for cardiac etiology 09/28/2014  . HTN (hypertension) 09/28/2014  . Hypokalemia   . Paresthesia 06/18/2014  . Numbness and tingling of left arm and leg   . Unstable angina ([place]) 05/24/2014  . OSA on CPAP 05/24/2014  . DM2 (diabetes mellitus, type 2) ([place]) 10/29/2012  . S/P laparoscopic appendectomy 06/04/2012  . Acute appendicitis with generalized peritonitis 06/04/2012  . Diastolic CHF ([place]) 19/50/9326  . Mediastinal mass 01/09/2012  . Hypotension 01/08/2012  . SOB (shortness of breath) 06/20/2011  . Mediastinal abnormality 06/20/2011  . Dyslipidemia 12/23/2009  . GLAUCOMA 12/23/2009  . ARTHRITIS 12/23/2009  . 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 Morbid obesity-BMI 45 09/13/2006  . ANXIETY STATE NOS 09/13/2006  . DISORDER, DEPRESSIVE NEC 09/13/2006  . CARPAL TUNNEL SYNDROME, MILD 09/13/2006  . Unspecified essential hypertension 09/13/2006  . IBS 09/13/2006  . DEGENERATION, LUMBAR/LUMBOSACRAL DISC 09/13/2006  . SYMPTOM, SWELLING/MASS/LUMP IN CHEST 09/13/2006  . PULMONARY EMBOLISM, HX OF 09/13/2006   Past Medical History:  Diagnosis Date  . Anginal pain ([place])    a. NL cath in 2008;  b. Myoview 03/2011: dec uptake along mid anterior wall on stress imaging -> ? attenuation vs. ischemia, EF 65%;  c. Echo 04/2011: EF 55-60%, no RWMA, Gr 2 dd  . Anxiety   . Asthma   . Bone cancer ([place])   . Cancer ([place])   . CHF (congestive heart failure) ([place])   . CHF (congestive heart failure) ([place])   . Depression   . Diabetes mellitus   . Hypertension   . 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 Obesity   . Pulmonary edema   . Pulmonary embolism ([place])    a. 2008 -> coumadin x 6 mos.  . Sleep apnea    Family History  Problem Relation Age of Onset  . Emphysema Mother   . Arthritis Mother   . Heart failure Mother     alive @ 48  . Asthma Brother   . Heart disease Father     died @ 63's.  .  Stroke Father   . Diabetes Sister   . Heart disease Paternal Grandfather   . Colon cancer Maternal Grandfather   . Breast cancer Maternal Aunt    Past Surgical History:  Procedure Laterality Date  . ABDOMINAL HYSTERECTOMY  2005  . CARDIAC CATHETERIZATION     Normal  . CARDIAC CATHETERIZATION N/A 09/30/2014   Procedure: Left Heart Cath and Coronary Angiography;  Surgeon: Sherren Mocha, MD;  Location: [facility];  Service: Cardiovascular;  Laterality: N/A;  . LAPAROSCOPIC APPENDECTOMY N/A 06/03/2012   Procedure: APPENDECTOMY LAPAROSCOPIC;  Surgeon: Stark Klein, MD;  Location: [facility];  Service: General;  Laterality: N/A;  . Left knee surgery  2008  . LEG SURGERY    . TUBAL LIGATION  1989   Social History   Occupational History  . UNEMPLOYED Disabled   Social  History Main Topics  . Smoking status: Former Smoker    Packs/day: 0.50    Years: 15.00    Types: Cigarettes    Quit date: 04/24/1985  . Smokeless tobacco: Never Used  . Alcohol use No  . Drug use: No  . Sexual activity: No

## 2016-07-26 ENCOUNTER — Encounter (INDEPENDENT_AMBULATORY_CARE_PROVIDER_SITE_OTHER): Payer: Self-pay | Admitting: Physical Medicine and Rehabilitation

## 2016-07-26 DIAGNOSIS — M5010 Cervical disc disorder with radiculopathy, unspecified cervical region: Secondary | ICD-10-CM | POA: Insufficient documentation

## 2016-07-26 DIAGNOSIS — M5412 Radiculopathy, cervical region: Secondary | ICD-10-CM | POA: Insufficient documentation

## 2016-08-01 IMAGING — CR DG CHEST 2V
2 series · 2 of 2 positions shown · non-contrast
Comparison: 04/27/2014.

CLINICAL DATA: Body aches.

EXAM:
CHEST  2 VIEW

[chest pa]
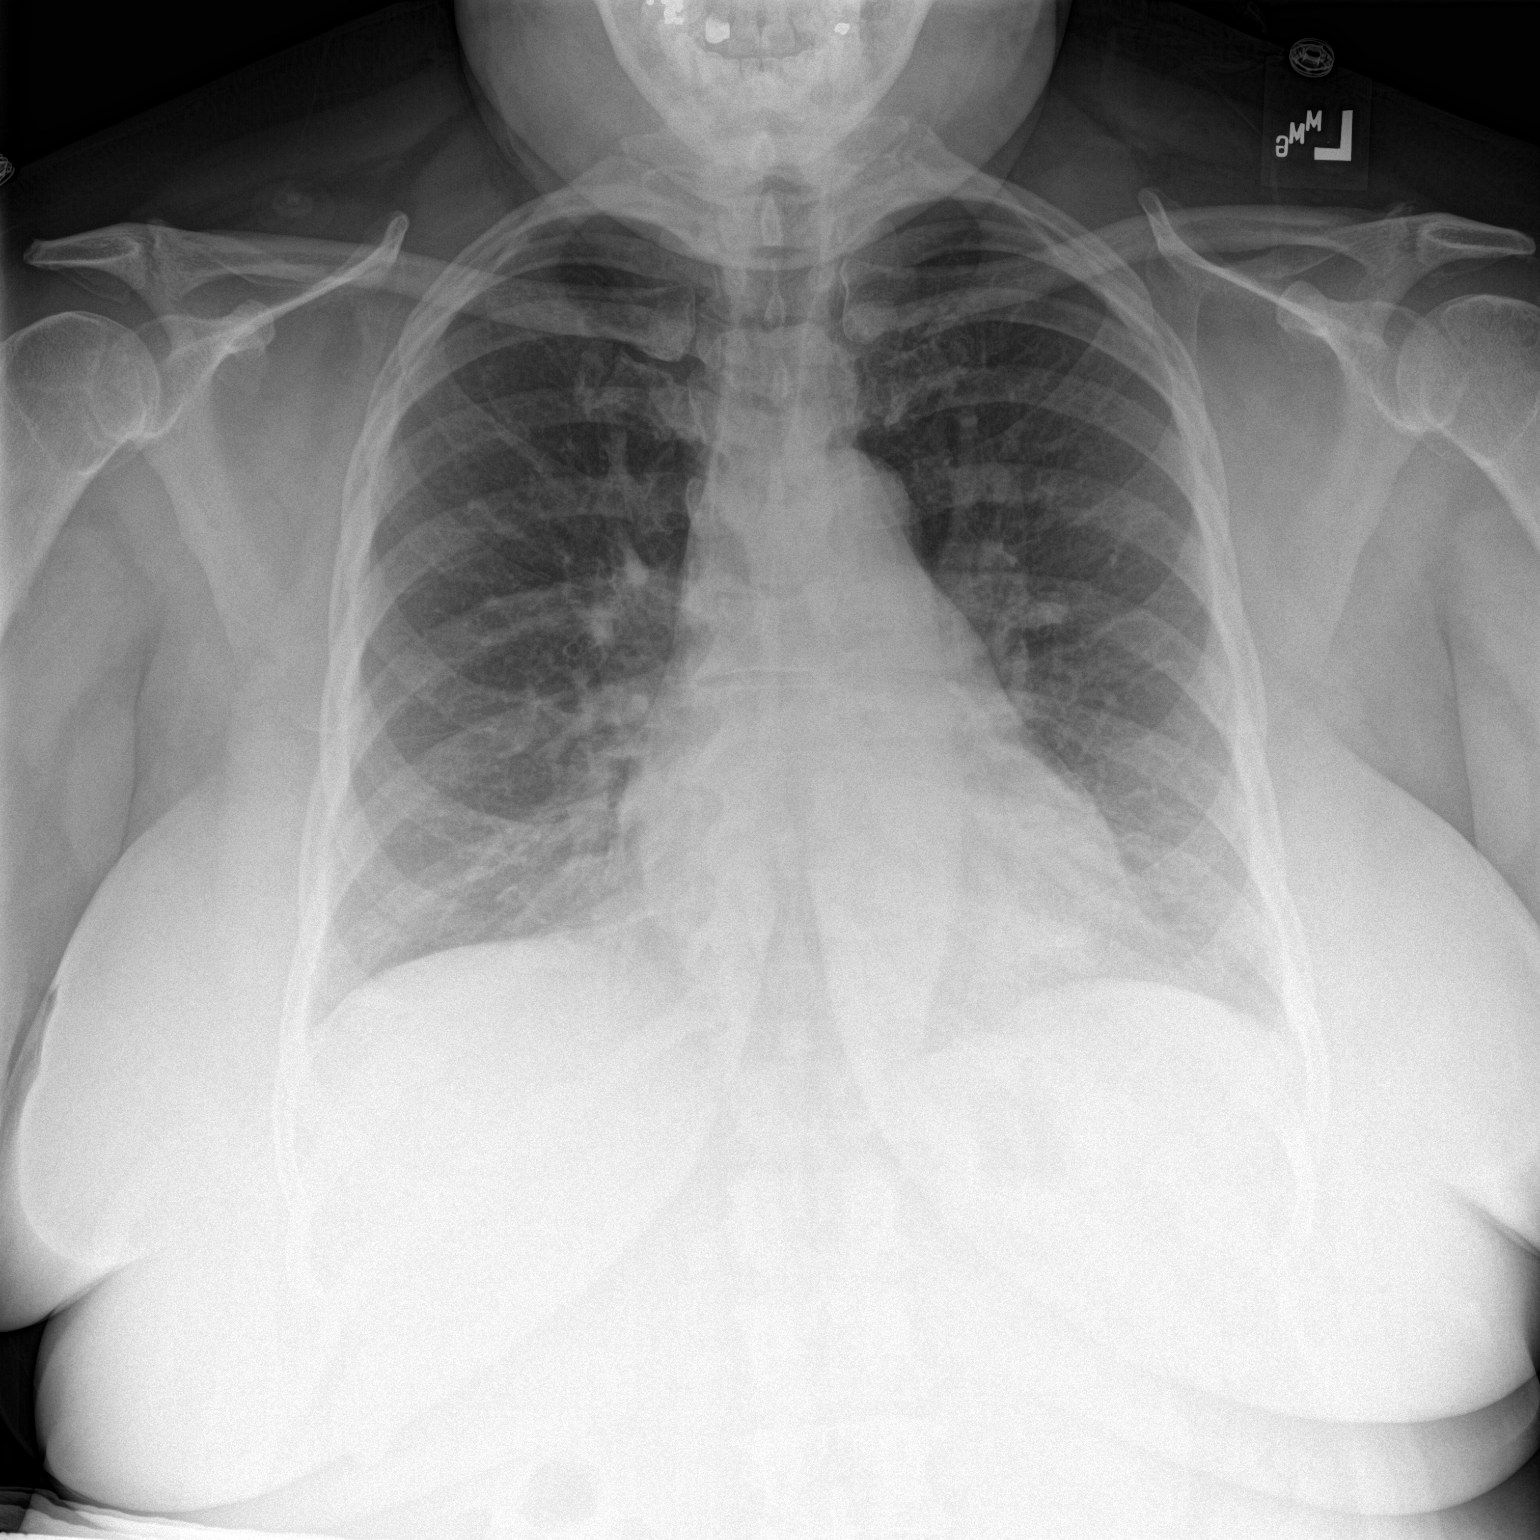

[chest lat]
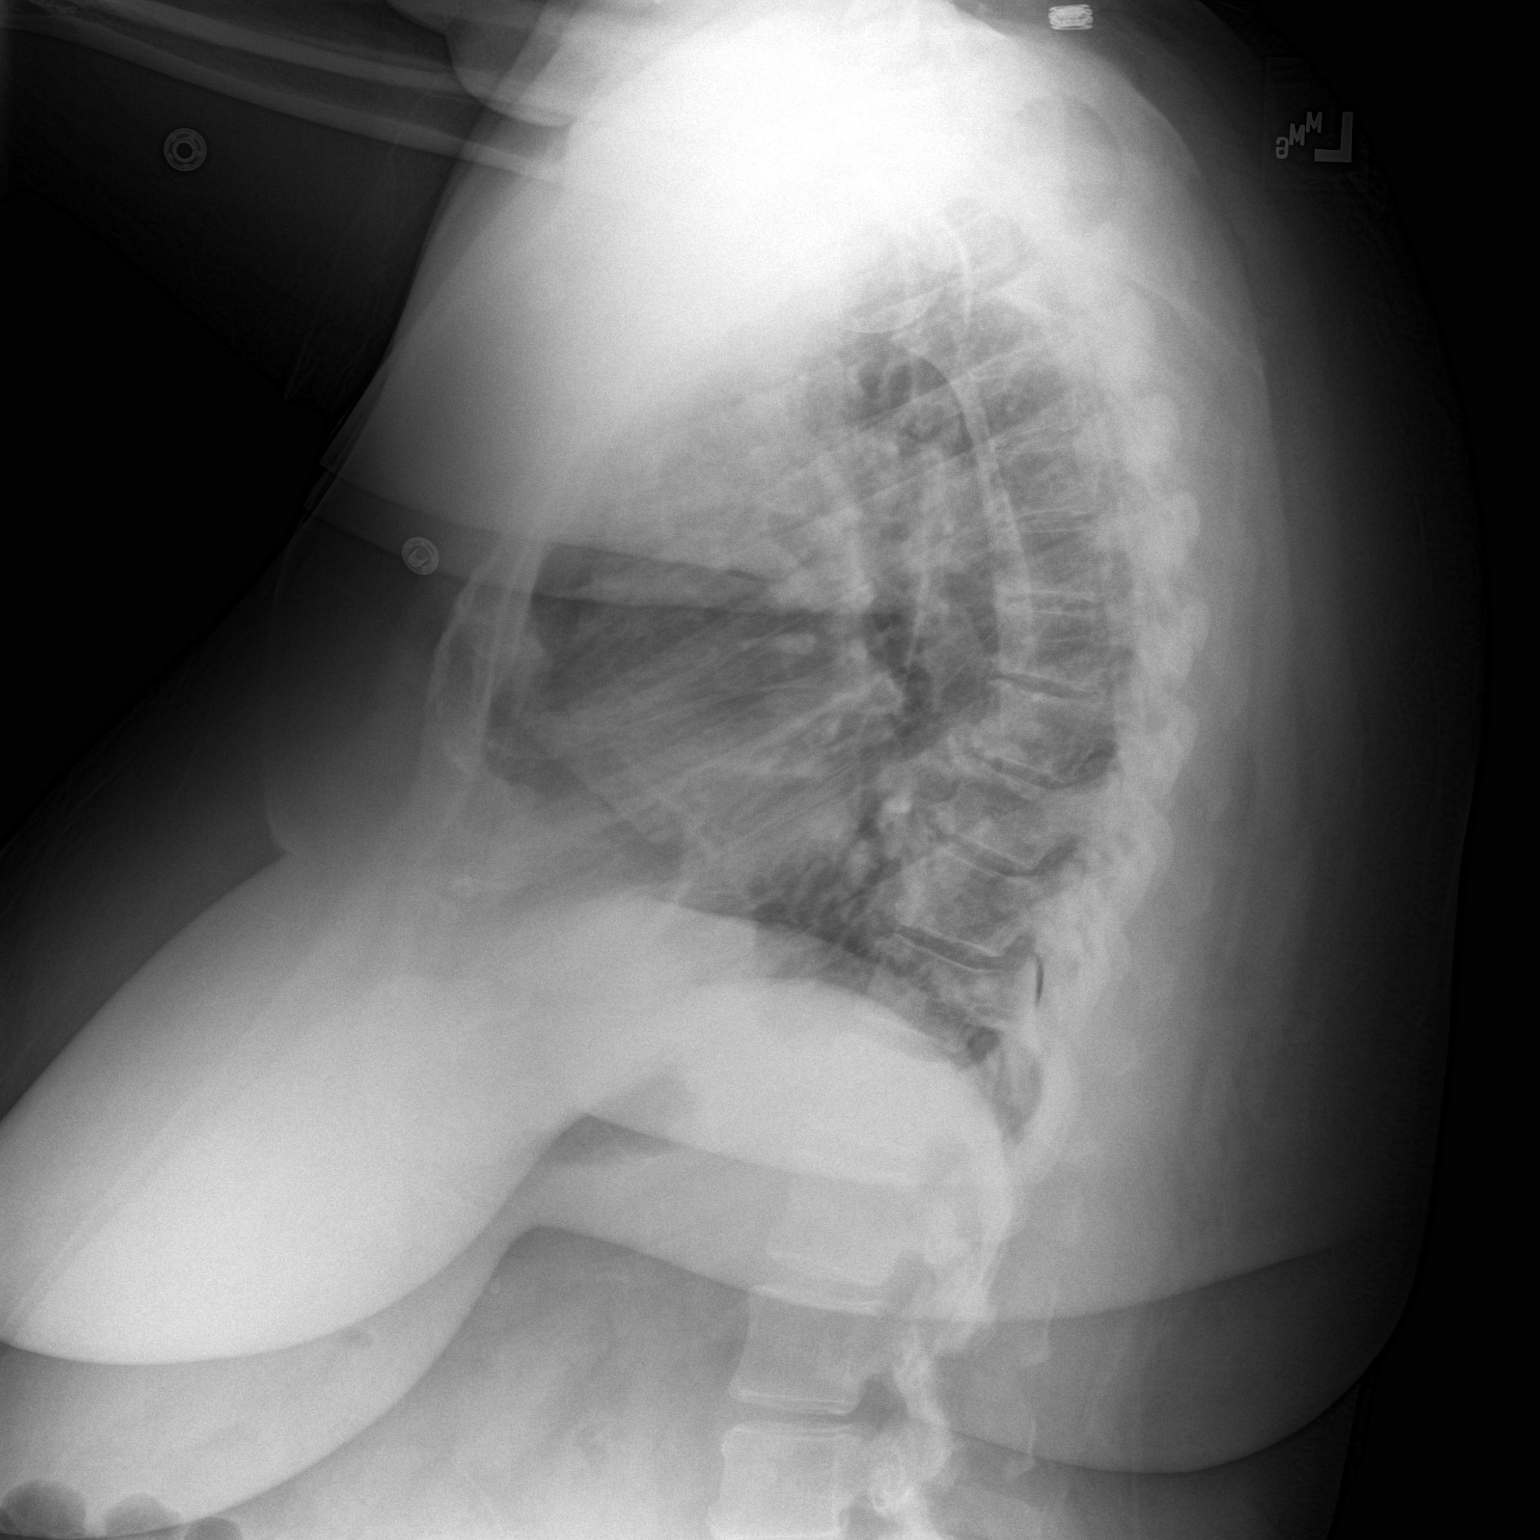

[2 of 2 positions shown; findings below may reference images not displayed]

FINDINGS: The heart size and mediastinal contours are within normal limits.
Both lungs are clear. The visualized skeletal structures are
unremarkable.
IMPRESSION: No active cardiopulmonary disease.  Stable appearance from priors.

## 2016-08-02 ENCOUNTER — Telehealth (INDEPENDENT_AMBULATORY_CARE_PROVIDER_SITE_OTHER): Payer: Self-pay | Admitting: Orthopedic Surgery

## 2016-08-02 ENCOUNTER — Ambulatory Visit (INDEPENDENT_AMBULATORY_CARE_PROVIDER_SITE_OTHER): Payer: Medicare Other | Admitting: Physical Medicine and Rehabilitation

## 2016-08-02 ENCOUNTER — Ambulatory Visit (INDEPENDENT_AMBULATORY_CARE_PROVIDER_SITE_OTHER): Payer: Medicare Other

## 2016-08-02 ENCOUNTER — Encounter (INDEPENDENT_AMBULATORY_CARE_PROVIDER_SITE_OTHER): Payer: Self-pay | Admitting: Physical Medicine and Rehabilitation

## 2016-08-02 VITALS — BP 157/90 | HR 89 | Temp 98.2°F

## 2016-08-02 DIAGNOSIS — M5412 Radiculopathy, cervical region: Secondary | ICD-10-CM

## 2016-08-02 MED ORDER — METHYLPREDNISOLONE ACETATE 80 MG/ML IJ SUSP
80.0000 mg | Freq: Once | INTRAMUSCULAR | Status: AC
  Administered 2016-08-02: 80 mg

## 2016-08-02 MED ORDER — LIDOCAINE HCL (PF) 1 % IJ SOLN
0.3300 mL | Freq: Once | INTRAMUSCULAR | Status: AC
  Administered 2016-08-02: 0.3 mL

## 2016-08-02 NOTE — Patient Instructions (Signed)

## 2016-08-02 NOTE — Progress Notes (Signed)
[name] - 56 y.o. female MRN [id number]  Date of birth: 1961-03-11  Office Visit Note: Visit Date: 08/02/2016 PCP: Velna Hatchet, MD Referred by: Velna Hatchet, MD  Subjective: Chief Complaint  Patient presents with  . Neck - Pain   HPI: [name] is a 56 year old female who is here today for planned right C7-T1 interlaminar injection. Starting to have numbness and tingling in right hand. Please see our prior evaluation and management note for further details and justification.    ROS Otherwise per HPI.  Assessment & Plan: Visit Diagnoses:  1. Cervical radiculopathy     Plan: No additional findings.   Meds & Orders:  Meds ordered this encounter  Medications  . lidocaine (PF) (XYLOCAINE) 1 % injection 0.3 mL  . methylPREDNISolone acetate (DEPO-MEDROL) injection 80 mg    Orders Placed This Encounter  Procedures  . XR C-ARM NO REPORT  . Epidural Steroid injection    Follow-up: Return if symptoms worsen or fail to improve.   Procedures: No procedures performed  Cervical Epidural Steroid Injection - Interlaminar Approach with Fluoroscopic Guidance  Patient: [name]      Date of Birth: [birth year] MRN: [id number] PCP: Velna Hatchet, MD      Visit Date: 08/02/2016   Universal Protocol:    Date/Time: 04/14/187:43 AM  Consent Given By: the patient  Position: PRONE  Additional Comments: Vital signs were monitored before and after the procedure. Patient was prepped and draped in the usual sterile fashion. The correct patient, procedure, and site was verified.   Injection Procedure Details:  Procedure Site One Meds Administered:  Meds ordered this encounter  Medications  . lidocaine (PF) (XYLOCAINE) 1 % injection 0.3 mL  . methylPREDNISolone acetate (DEPO-MEDROL) injection 80 mg     Laterality: Right  Location/Site:  C7-T1  Needle size: 20 G  Needle type: Touhy  Needle Placement: Paramedian epidural  space  Findings:  -Contrast Used: 1 mL iohexol 180 mg iodine/mL   -Comments: Excellent flow of contrast into the epidural space.  Procedure Details: Using a paramedian approach from the side mentioned above, the region overlying the inferior lamina was localized under fluoroscopic visualization and the soft tissues overlying this structure were infiltrated with 4 ml. of 1% Lidocaine without Epinephrine. A # 20 gauge, Tuohy needle was inserted into the epidural space using a paramedian approach.  The epidural space was localized using loss of resistance along with lateral and contralateral oblique bi-planar fluoroscopic views.  After negative aspirate for air, blood, and CSF, a 2 ml. volume of Isovue-250 was injected into the epidural space and the flow of contrast was observed. Radiographs were obtained for documentation purposes.   The injectate was administered into the level noted above.  Additional Comments:  The patient tolerated the procedure well Dressing: Band-Aid    Post-procedure details: Patient was observed during the procedure. Post-procedure instructions were reviewed.  Patient left the clinic in stable condition.     Clinical History: Cervical spine MR 05/30/1014 There is reversal of the normal cervical lordosis with apex at C4-5. Trace anterolisthesis of C4 on C5 present. Overall, alignment is stable from previous MRI from 2014. Vertebral body heights are maintained. Signal intensity within the vertebral body bone marrow is normal. No focal osseous lesion  C4-5: Left eccentric circumferential degenerative disc osteophyte complex with left-sided uncovertebral spurring and mild left facet hypertrophy. There is resultant moderate to severe left foraminal narrowing. Mild effacement of the left ventral thecal sac present without significant  canal stenosis. Mild right foraminal narrowing present as well.  C5-6: Left eccentric degenerative disc osteophyte with  uncovertebral hypertrophy and facet arthrosis. There is fairly severe left sided foraminal narrowing. Mild to moderate right foraminal stenosis present as well. No central canal stenosis.  C6-7: Mild diffuse degenerative disc osteophyte complex, a centric to the right, with right-sided uncovertebral spurring and hypertrophy. There is moderate to severe right foraminal stenosis with mild left foraminal narrowing. No significant central canal stenosis.  C7-T1: Mild left-sided facet hypertrophy present. No significant canal or foraminal stenosis.  Overall very similar findings to 2014  She reports that she quit smoking about 31 years ago. Her smoking use included Cigarettes. She has a 7.50 pack-year smoking history. She has never used smokeless tobacco.   Recent Labs  05/21/16 1132  HGBA1C 6.6*    Objective:  VS:  HT:    WT:   BMI:     BP:(!) 157/90  HR:89bpm  TEMP:98.2 F (36.8 C)( )  RESP:96 % Physical Exam  Musculoskeletal:  Cervical range of motion is limited in ranges of rotation. She has good distal strength both hands with good strength with abduction and wrist extension long finger flexion.    Ortho Exam Imaging: No results found.  Past Medical/Family/Surgical/Social History: Medications & Allergies reviewed per EMR Patient Active Problem List   Diagnosis Date Noted  . Cervical radiculopathy 07/26/2016  . Cervical disc disorder with radiculopathy 07/26/2016  . Right arm pain 06/29/2016  . Cervicalgia 06/29/2016  . Primary osteoarthritis of left knee 06/29/2016  . Acute respiratory failure with hypoxia ([place]) 05/28/2016  . CAP (community acquired pneumonia) 05/22/2016  . Community acquired pneumonia 05/21/2016  . Hypersomnia with sleep apnea 11/18/2015  . Lethargy 11/18/2015  . Chest pain 09/30/2014  . Abnormal cardiac function test 09/29/2014  . Nausea with vomiting   . Chest pain with moderate risk for cardiac etiology 09/28/2014  . HTN (hypertension)  09/28/2014  . Hypokalemia   . Paresthesia 06/18/2014  . Numbness and tingling of left arm and leg   . Unstable angina ([place]) 05/24/2014  . OSA on CPAP 05/24/2014  . DM2 (diabetes mellitus, type 2) ([place]) 10/29/2012  . S/P laparoscopic appendectomy 06/04/2012  . Acute appendicitis with generalized peritonitis 06/04/2012  . Diastolic CHF ([place]) 59/74/1638  . Mediastinal mass 01/09/2012  . Hypotension 01/08/2012  . SOB (shortness of breath) 06/20/2011  . Mediastinal abnormality 06/20/2011  . Dyslipidemia 12/23/2009  . GLAUCOMA 12/23/2009  . ARTHRITIS 12/23/2009  . 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 Morbid obesity-BMI 45 09/13/2006  . ANXIETY STATE NOS 09/13/2006  . DISORDER, DEPRESSIVE NEC 09/13/2006  . CARPAL TUNNEL SYNDROME, MILD 09/13/2006  . Unspecified essential hypertension 09/13/2006  . IBS 09/13/2006  . DEGENERATION, LUMBAR/LUMBOSACRAL DISC 09/13/2006  . SYMPTOM, SWELLING/MASS/LUMP IN CHEST 09/13/2006  . PULMONARY EMBOLISM, HX OF 09/13/2006   Past Medical History:  Diagnosis Date  . Anginal pain ([place])    a. NL cath in 2008;  b. Myoview 03/2011: dec uptake along mid anterior wall on stress imaging -> ? attenuation vs. ischemia, EF 65%;  c. Echo 04/2011: EF 55-60%, no RWMA, Gr 2 dd  . Anxiety   . Asthma   . Bone cancer ([place])   . Cancer ([place])   . CHF (congestive heart failure) ([place])   . CHF (congestive heart failure) ([place])   . Depression   . Diabetes mellitus   . Hypertension   . 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  Obesity   . Pulmonary edema   . Pulmonary embolism ([place])    a. 2008 -> coumadin x 6 mos.  . Sleep apnea    Family History  Problem Relation Age of Onset  . Emphysema Mother   . Arthritis Mother   . Heart failure Mother     alive @ 23  . Asthma Brother   . Heart disease Father     died @ 40's.  . Stroke Father   . Diabetes Sister   . Heart disease Paternal Grandfather   . Colon cancer Maternal Grandfather   . Breast cancer Maternal  Aunt    Past Surgical History:  Procedure Laterality Date  . ABDOMINAL HYSTERECTOMY  2005  . CARDIAC CATHETERIZATION     Normal  . CARDIAC CATHETERIZATION N/A 09/30/2014   Procedure: Left Heart Cath and Coronary Angiography;  Surgeon: Sherren Mocha, MD;  Location: [facility];  Service: Cardiovascular;  Laterality: N/A;  . LAPAROSCOPIC APPENDECTOMY N/A 06/03/2012   Procedure: APPENDECTOMY LAPAROSCOPIC;  Surgeon: Stark Klein, MD;  Location: [facility];  Service: General;  Laterality: N/A;  . Left knee surgery  2008  . LEG SURGERY    . TUBAL LIGATION  1989   Social History   Occupational History  . UNEMPLOYED Disabled   Social History Main Topics  . Smoking status: Former Smoker    Packs/day: 0.50    Years: 15.00    Types: Cigarettes    Quit date: 04/24/1985  . Smokeless tobacco: Never Used  . Alcohol use No  . Drug use: No  . Sexual activity: No

## 2016-08-02 NOTE — Telephone Encounter (Signed)
Patient is here in the office now seeing Dr Ernestina Patches for injection but is requesting a note that shows projected out of work dates for the surgery in May with Dr. Marlou Sa (L knee).  Please let patient know if this can be done while she is here.  Her appt is @ 3pm, but she has not gone back yet.

## 2016-08-02 NOTE — Telephone Encounter (Signed)
Discussed with Dr Marlou Sa. Note written for patient

## 2016-08-05 NOTE — Procedures (Signed)
Cervical Epidural Steroid Injection - Interlaminar Approach with Fluoroscopic Guidance  Patient: [name]      Date of Birth: 09-03-1960 MRN: [id number] PCP: Velna Hatchet, MD      Visit Date: 08/02/2016   Universal Protocol:    Date/Time: 04/14/187:43 AM  Consent Given By: the patient  Position: PRONE  Additional Comments: Vital signs were monitored before and after the procedure. Patient was prepped and draped in the usual sterile fashion. The correct patient, procedure, and site was verified.   Injection Procedure Details:  Procedure Site One Meds Administered:  Meds ordered this encounter  Medications  . lidocaine (PF) (XYLOCAINE) 1 % injection 0.3 mL  . methylPREDNISolone acetate (DEPO-MEDROL) injection 80 mg     Laterality: Right  Location/Site:  C7-T1  Needle size: 20 G  Needle type: Touhy  Needle Placement: Paramedian epidural space  Findings:  -Contrast Used: 1 mL iohexol 180 mg iodine/mL   -Comments: Excellent flow of contrast into the epidural space.  Procedure Details: Using a paramedian approach from the side mentioned above, the region overlying the inferior lamina was localized under fluoroscopic visualization and the soft tissues overlying this structure were infiltrated with 4 ml. of 1% Lidocaine without Epinephrine. A # 20 gauge, Tuohy needle was inserted into the epidural space using a paramedian approach.  The epidural space was localized using loss of resistance along with lateral and contralateral oblique bi-planar fluoroscopic views.  After negative aspirate for air, blood, and CSF, a 2 ml. volume of Isovue-250 was injected into the epidural space and the flow of contrast was observed. Radiographs were obtained for documentation purposes.   The injectate was administered into the level noted above.  Additional Comments:  The patient tolerated the procedure well Dressing: Band-Aid    Post-procedure details: Patient was  observed during the procedure. Post-procedure instructions were reviewed.  Patient left the clinic in stable condition.

## 2016-08-07 ENCOUNTER — Other Ambulatory Visit (INDEPENDENT_AMBULATORY_CARE_PROVIDER_SITE_OTHER): Payer: Self-pay | Admitting: Orthopedic Surgery

## 2016-08-10 NOTE — Pre-Procedure Instructions (Signed)
[name]  08/10/2016      [id number] [address], [place] [address] Phone: [phone] Fax: [phone]    Your procedure is scheduled on 08-22-2016. Tuesday   Report to [facility] Admitting at 5:30 AM   Call this number if you have problems the morning of surgery:  [phone]   Remember:  Do not eat food or drink liquids after midnight.   Take these medicines the morning of surgery with A SIP OF WATER Albuterol inhaler,symbicort  Inhaler,carvedilol(Coreg),cyclobenzaprine(Flexeril),gabapentin(Neurontin),hydralazine(Apresoline),Duoneb nebulizer,Klor-Con,Pantoprazole(Protonix),  STOP ASPIRIN,ANTIINFLAMATORIES (IBUPROFEN,ALEVE,MOTRIN,ADVIL,GOODY'S POWDERS),HERBAL SUPPLEMENTS,FISH OIL,AND VITAMINS 5-7 DAYS PRIOR TO SURGERY      How to Manage Your Diabetes Before and After Surgery  Why is it important to control my blood sugar before and after surgery? . Improving blood sugar levels before and after surgery helps healing and can limit problems. . A way of improving blood sugar control is eating a healthy diet by: o  Eating less sugar and carbohydrates o  Increasing activity/exercise o  Talking with your doctor about reaching your blood sugar goals . High blood sugars (greater than 180 mg/dL) can raise your risk of infections and slow your recovery, so you will need to focus on controlling your diabetes during the weeks before surgery. . Make sure that the doctor who takes care of your diabetes knows about your planned surgery including the date and location.  How do I manage my blood sugar before surgery? . Check your blood sugar at least 4 times a day, starting 2 days before surgery, to make sure that the level is not too high or low. o Check your blood sugar the morning of your surgery when you wake up and every 2 hours until you get to the Short Stay unit. . If your blood sugar is less than 70  mg/dL, you will need to treat for low blood sugar: o Do not take insulin. o Treat a low blood sugar (less than 70 mg/dL) with  cup of clear juice (cranberry or apple), 4 glucose tablets, OR glucose gel. o Recheck blood sugar in 15 minutes after treatment (to make sure it is greater than 70 mg/dL). If your blood sugar is not greater than 70 mg/dL on recheck, call [phone] for further instructions. . Report your blood sugar to the short stay nurse when you get to Short Stay.  . If you are admitted to the hospital after surgery: o Your blood sugar will be checked by the staff and you will probably be given insulin after surgery (instead of oral diabetes medicines) to make sure you have good blood sugar levels. o The goal for blood sugar control after surgery is 80-180 mg/dL.              WHAT DO I DO ABOUT MY DIABETES MEDICATION?   [name] Do not take oral diabetes medicines (pills) the morning of surgery.   :   Reviewed and Endorsed by [facility] Patient Education Committee, August 2015     Do not wear jewelry, make-up or nail polish.  Do not wear lotions, powders, or perfumes, or deoderant.  Do not shave 48 hours prior to surgery.  Men may shave face and neck.  Do not bring valuables to the hospital.  [facility] is not responsible for any belongings or valuables.  Contacts, dentures or bridgework may not be worn into surgery.  Leave your suitcase in the car.  After surgery it  may be brought to your room.  For patients admitted to the hospital, discharge time will be determined by your treatment team.  Patients discharged the day of surgery will not be allowed to drive home.      Please read over the following fact sheets that you were given. MRSA Information and Surgical Site Infection Prevention

## 2016-08-11 ENCOUNTER — Ambulatory Visit (HOSPITAL_COMMUNITY)
Admission: RE | Admit: 2016-08-11 | Discharge: 2016-08-11 | Disposition: A | Discharge status: Home / Self Care | Payer: Medicare Other | Source: Ambulatory Visit | Attending: Orthopedic Surgery | Admitting: Orthopedic Surgery

## 2016-08-11 ENCOUNTER — Encounter (HOSPITAL_COMMUNITY)
Admission: RE | Admit: 2016-08-11 | Discharge: 2016-08-11 | Disposition: A | Discharge status: Home / Self Care | Payer: Medicare Other | Source: Ambulatory Visit | Attending: Orthopedic Surgery | Admitting: Orthopedic Surgery

## 2016-08-11 ENCOUNTER — Encounter (HOSPITAL_COMMUNITY): Payer: Self-pay

## 2016-08-11 DIAGNOSIS — Z419 Encounter for procedure for purposes other than remedying health state, unspecified: Secondary | ICD-10-CM

## 2016-08-11 DIAGNOSIS — I110 Hypertensive heart disease with heart failure: Secondary | ICD-10-CM | POA: Insufficient documentation

## 2016-08-11 DIAGNOSIS — K219 Gastro-esophageal reflux disease without esophagitis: Secondary | ICD-10-CM | POA: Diagnosis not present

## 2016-08-11 DIAGNOSIS — I509 Heart failure, unspecified: Secondary | ICD-10-CM | POA: Insufficient documentation

## 2016-08-11 DIAGNOSIS — J45909 Unspecified asthma, uncomplicated: Secondary | ICD-10-CM | POA: Diagnosis not present

## 2016-08-11 DIAGNOSIS — Z01812 Encounter for preprocedural laboratory examination: Secondary | ICD-10-CM | POA: Insufficient documentation

## 2016-08-11 DIAGNOSIS — M1712 Unilateral primary osteoarthritis, left knee: Secondary | ICD-10-CM | POA: Diagnosis not present

## 2016-08-11 DIAGNOSIS — Z01818 Encounter for other preprocedural examination: Secondary | ICD-10-CM | POA: Insufficient documentation

## 2016-08-11 DIAGNOSIS — Z85830 Personal history of malignant neoplasm of bone: Secondary | ICD-10-CM | POA: Diagnosis not present

## 2016-08-11 DIAGNOSIS — G4733 Obstructive sleep apnea (adult) (pediatric): Secondary | ICD-10-CM | POA: Diagnosis not present

## 2016-08-11 DIAGNOSIS — Z7982 Long term (current) use of aspirin: Secondary | ICD-10-CM | POA: Diagnosis not present

## 2016-08-11 DIAGNOSIS — Z79899 Other long term (current) drug therapy: Secondary | ICD-10-CM | POA: Insufficient documentation

## 2016-08-11 DIAGNOSIS — E119 Type 2 diabetes mellitus without complications: Secondary | ICD-10-CM | POA: Diagnosis not present

## 2016-08-11 DIAGNOSIS — Z87891 Personal history of nicotine dependence: Secondary | ICD-10-CM | POA: Insufficient documentation

## 2016-08-11 HISTORY — DX: Dyspnea, unspecified: R06.00

## 2016-08-11 HISTORY — DX: Frequency of micturition: R35.0

## 2016-08-11 HISTORY — DX: Unspecified osteoarthritis, unspecified site: M19.90

## 2016-08-11 HISTORY — DX: Gastro-esophageal reflux disease without esophagitis: K21.9

## 2016-08-11 HISTORY — DX: Urgency of urination: R39.15

## 2016-08-11 LAB — BASIC METABOLIC PANEL
Anion gap: 8 (ref 5–15)
BUN: 10 mg/dL (ref 6–20)
CO2: 29 mmol/L (ref 22–32)
Calcium: 9.6 mg/dL (ref 8.9–10.3)
Chloride: 105 mmol/L (ref 101–111)
Creatinine, Ser: 0.81 mg/dL (ref 0.44–1.00)
GFR calc Af Amer: >60 mL/min (ref >60)
GFR calc non Af Amer: >60 mL/min (ref >60)
Glucose, Bld: 132 mg/dL — ABNORMAL HIGH (ref 65–99)
Potassium: 3.8 mmol/L (ref 3.5–5.1)
Sodium: 142 mmol/L (ref 135–145)

## 2016-08-11 LAB — CBC
HCT: 43.4 % (ref 36.0–46.0)
Hemoglobin: 13.8 g/dL (ref 12.0–15.0)
MCH: 27.9 pg (ref 26.0–34.0)
MCHC: 31.8 g/dL (ref 30.0–36.0)
MCV: 87.7 fL (ref 78.0–100.0)
Platelets: 234 10*3/uL (ref 150–400)
RBC: 4.95 MIL/uL (ref 3.87–5.11)
RDW: 13.4 % (ref 11.5–15.5)
WBC: 7.3 10*3/uL (ref 4.0–10.5)

## 2016-08-11 LAB — URINALYSIS, ROUTINE W REFLEX MICROSCOPIC
Bilirubin Urine: NEGATIVE
Glucose, UA: NEGATIVE mg/dL
Hgb urine dipstick: NEGATIVE
Ketones, ur: NEGATIVE mg/dL
Leukocytes, UA: NEGATIVE
Nitrite: NEGATIVE
Protein, ur: NEGATIVE mg/dL
Specific Gravity, Urine: 1.021 (ref 1.005–1.030)
pH: 7 (ref 5.0–8.0)

## 2016-08-11 LAB — SURGICAL PCR SCREEN
MRSA, PCR: NEGATIVE
Staphylococcus aureus: NEGATIVE

## 2016-08-11 LAB — GLUCOSE, CAPILLARY: Glucose-Capillary: 177 mg/dL — ABNORMAL HIGH (ref 65–99)

## 2016-08-11 NOTE — Progress Notes (Signed)
PCP  Dr, Velna Hatchet  Cardiologist Dr. [name]  Has not seen in over a year  Denies any recent chest pain or discomfort

## 2016-08-12 LAB — URINE CULTURE

## 2016-08-12 LAB — HEMOGLOBIN A1C
Hgb A1c MFr Bld: 7.4 % — ABNORMAL HIGH (ref 4.8–5.6)
Mean Plasma Glucose: 166 mg/dL

## 2016-08-15 NOTE — Progress Notes (Addendum)
Anesthesia Chart Review:  Pt is a 56 year old female scheduled for L total knee arthroplasty on 08/22/2016 with Meredith Pel, MD.   - PCP is Velna Hatchet, MD.   PMH includes:  CHF, HTN, PE (2008), DM, OSA, asthma, bone cancer, GERD.  Former smoker. BMI 46. S/p appendectomy 06/03/12.   - Pt hospitalized 1/28-05/28/16 for acute respiratory failure with hypoxia, CAP.  Pt complained of chest pain, troponin cycled x4, all of which were slightly elevated but stable at 0.05.   Medications include: Albuterol, ASA 325 mg, Lipitor, Symbicort, carvedilol, Lasix, hydralazine, DuoNeb, Isordil, potassium, kombiglyze, lisinopril, protonix.   Preoperative labs reviewed.  HbA1c 7.4, glucose 132   CXR 08/11/16: No active cardiopulmonary disease. Degenerative changes thoracic spine..  EKG 05/22/16: NSR. Rightward axis. ST & T wave abnormality, consider inferolateral ischemia. Prolonged QT  Echo 05/22/16: - Left ventricle: The cavity size was normal. Wall thickness was normal. Systolic function was normal. The estimated ejection fraction was in the range of 55% to 60%. Although no diagnostic regional wall motion abnormality was identified, this possibility cannot be completely excluded on the basis of this study. Features are consistent with a pseudonormal left ventricular filling pattern, with concomitant abnormal relaxation and increased filling pressure (grade 2 diastolic dysfunction). - Aortic valve: There was no stenosis. - Mitral valve: There was no significant regurgitation. - Right ventricle: The cavity size was normal. Systolic function was normal. - Pulmonary arteries: No complete TR doppler jet so unable to estimate PA systolic pressure. - Systemic veins: IVC not visualized. - Impressions: Normal LV size with EF 55-60%. Moderate diastolic dysfunction. Normal RV size and systolic function. No significant valvular abnormalities.   Cardiac cath 09/30/14:   ANGIOGRAPHICALLY NORMAL CORONARY  ARTERIES  NORMAL LV FUNCTION  ELEVATED LVEDP   Benna Arno, FNP-BC [facility]/Anesthesiology Phone: [phone] 08/15/2016 4:57 PM  Addendum: Above reviewed with anesthesiologist Dr. Linna Caprice. T wave changes more prominent on EKG from 05/22/16 during admission for CAP; however, with normal coronaries by cath in 2016. EF normal by echo. Will repeat EKG on the day of surgery. If no acute cardiopulmonary issues then it is anticipated that she can proceed as planned.  George Hugh [facility]/Anesthesiology Phone [phone] 08/16/2016 9:46 AM

## 2016-08-21 NOTE — H&P (Signed)
TOTAL KNEE ADMISSION H&P  Patient is being admitted for left total knee arthroplasty.  Subjective:  Chief Complaint:left knee pain.  HPI: [name], 56 y.o. female, has a history of pain and functional disability in the left knee due to arthritis and has failed non-surgical conservative treatments for greater than 12 weeks to includeNSAID's and/or analgesics, corticosteriod injections, use of assistive devices and activity modification.  Onset of symptoms was gradual, starting 5 years ago with gradually worsening course since that time. The patient noted prior procedures on the knee to include  arthroscopy and menisectomy on the left knee(s).  Patient currently rates pain in the left knee(s) at 9 out of 10 with activity. Patient has night pain, worsening of pain with activity and weight bearing, pain that interferes with activities of daily living, pain with passive range of motion, crepitus and joint swelling.  Patient has evidence of subchondral sclerosis and joint space narrowing by imaging studies. 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 There is no active infection.  Patient Active Problem List   Diagnosis Date Noted  . Cervical radiculopathy 07/26/2016  . Cervical disc disorder with radiculopathy 07/26/2016  . Right arm pain 06/29/2016  . Cervicalgia 06/29/2016  . Primary osteoarthritis of left knee 06/29/2016  . Acute respiratory failure with hypoxia ([place]) 05/28/2016  . CAP (community acquired pneumonia) 05/22/2016  . Community acquired pneumonia 05/21/2016  . Hypersomnia with sleep apnea 11/18/2015  . Lethargy 11/18/2015  . Chest pain 09/30/2014  . Abnormal cardiac function test 09/29/2014  . Nausea with vomiting   . Chest pain with moderate risk for cardiac etiology 09/28/2014  . HTN (hypertension) 09/28/2014  . Hypokalemia   . Paresthesia 06/18/2014  . Numbness and tingling of left arm and leg   .  Unstable angina ([place]) 05/24/2014  . OSA on CPAP 05/24/2014  . DM2 (diabetes mellitus, type 2) ([place]) 10/29/2012  . S/P laparoscopic appendectomy 06/04/2012  . Acute appendicitis with generalized peritonitis 06/04/2012  . Diastolic CHF ([place]) 83/15/1761  . Mediastinal mass 01/09/2012  . Hypotension 01/08/2012  . SOB (shortness of breath) 06/20/2011  . Mediastinal abnormality 06/20/2011  . Dyslipidemia 12/23/2009  . GLAUCOMA 12/23/2009  . ARTHRITIS 12/23/2009  . 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 Morbid obesity-BMI 45 09/13/2006  . ANXIETY STATE NOS 09/13/2006  . DISORDER, DEPRESSIVE NEC 09/13/2006  . CARPAL TUNNEL SYNDROME, MILD 09/13/2006  . Unspecified essential hypertension 09/13/2006  . IBS 09/13/2006  . DEGENERATION, LUMBAR/LUMBOSACRAL DISC 09/13/2006  . SYMPTOM, SWELLING/MASS/LUMP IN CHEST 09/13/2006  . PULMONARY EMBOLISM, HX OF 09/13/2006   Past Medical History:  Diagnosis Date  . Anginal pain ([place])    a. NL cath in 2008;  b. Myoview 03/2011: dec uptake along mid anterior wall on stress imaging -> ? attenuation vs. ischemia, EF 65%;  c. Echo 04/2011: EF 55-60%, no RWMA, Gr 2 dd  . Anxiety   . Arthritis   . Asthma   . Bone cancer ([place])   . Cancer ([place])   . CHF (congestive heart failure) ([place])   . CHF (congestive heart failure) ([place])   . Depression   . Diabetes mellitus   . Dyspnea    with exertion  . Frequent urination   . GERD (gastroesophageal reflux disease)   . Headache   . Hypertension   . 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 Obesity   . Pulmonary edema   .  Pulmonary embolism ([place])    a. 2008 -> coumadin x 6 mos.  . Sleep apnea    does not use CPAP  . Urinary urgency     Past Surgical History:  Procedure Laterality Date  . ABDOMINAL HYSTERECTOMY  2005  . CARDIAC CATHETERIZATION     Normal  . CARDIAC CATHETERIZATION N/A 09/30/2014   Procedure: Left Heart Cath and Coronary Angiography;  Surgeon: Sherren Mocha, MD;  Location: [facility];  Service: Cardiovascular;  Laterality: N/A;  . LAPAROSCOPIC APPENDECTOMY N/A 06/03/2012   Procedure: APPENDECTOMY LAPAROSCOPIC;  Surgeon: Stark Klein, MD;  Location: [facility];  Service: General;  Laterality: N/A;  . Left knee surgery  2008  . LEG SURGERY    . TONSILLECTOMY    . TUBAL LIGATION  1989    No prescriptions prior to admission.   Allergies  Allergen Reactions  . Penicillins Swelling    Has patient had a PCN reaction causing immediate rash, facial/tongue/throat swelling, SOB or lightheadedness with hypotension: YES Has patient had a PCN reaction causing severe rash involving mucus membranes or skin necrosis: NO Has patient had a PCN reaction that required hospitalization NO Has patient had a PCN reaction occurring within the last 10 years: NO If all of the above answers are "NO", then may proceed with Cephalosporin use.  . Sulfa Antibiotics Diarrhea, Itching and Rash    Social History  Substance Use Topics  . Smoking status: Former Smoker    Packs/day: 0.50    Years: 15.00    Types: Cigarettes    Quit date: 04/24/1985  . Smokeless tobacco: Never Used  . Alcohol use No    Family History  Problem Relation Age of Onset  . Emphysema Mother   . Arthritis Mother   . Heart failure Mother     alive @ 31  . Asthma Brother   . Heart disease Father     died @ 34's.  . Stroke Father   . Diabetes Sister   . Heart disease Paternal Grandfather   . Colon cancer Maternal Grandfather   . Breast cancer Maternal Aunt      Review of Systems  Musculoskeletal: Positive for joint pain.  All other systems reviewed and are negative.   Objective:  Physical Exam  Constitutional: She appears well-developed.  HENT:  Head: Normocephalic.  Eyes: Pupils are equal, round, and reactive to light.  Neck: Normal range of motion.  Cardiovascular: Normal rate.   Respiratory: Effort normal.  Skin: Skin is warm.  Psychiatric: She has a normal mood and affect.    Vital signs in last 24  hours:    Labs:   Estimated body mass index is 45.67 kg/m as calculated from the following:   Height as of 08/11/16: 4\' 11"  (1.499 m).   Weight as of 08/11/16: 226 lb 2 oz (102.6 kg).   Imaging Review Plain radiographs demonstrate severe degenerative joint disease of the left knee(s). The overall alignment ismild varus. The bone quality appears to be good for age and reported activity level.  Assessment/Plan:  End stage arthritis, left knee   The patient history, physical examination, clinical judgment of the provider and imaging studies are consistent with end stage degenerative joint disease of the left knee(s) and total knee arthroplasty is deemed medically necessary. The treatment options including medical management, injection therapy arthroscopy and arthroplasty were discussed at length. The risks and benefits of total knee arthroplasty were presented and reviewed. The risks due to aseptic loosening, infection,  stiffness, patella tracking problems, thromboembolic complications and other imponderables were discussed. The patient acknowledged the explanation, agreed to proceed with the plan and consent was signed. Patient is being admitted for inpatient treatment for surgery, pain control, PT, OT, prophylactic antibiotics, VTE prophylaxis, progressive ambulation and ADL's and discharge planning. The patient is planning to be discharged home with home health services

## 2016-08-22 ENCOUNTER — Telehealth (INDEPENDENT_AMBULATORY_CARE_PROVIDER_SITE_OTHER): Payer: Self-pay | Admitting: Orthopedic Surgery

## 2016-08-22 ENCOUNTER — Inpatient Hospital Stay (HOSPITAL_COMMUNITY)
Admission: RE | Admit: 2016-08-22 | Discharge: 2016-08-25 | DRG: 470 | Disposition: A | Discharge status: Skilled Nursing Facility | Payer: Medicare Other | Source: Ambulatory Visit | Attending: Orthopedic Surgery | Admitting: Orthopedic Surgery

## 2016-08-22 ENCOUNTER — Inpatient Hospital Stay (HOSPITAL_COMMUNITY): Payer: Medicare Other | Admitting: Vascular Surgery

## 2016-08-22 ENCOUNTER — Encounter (HOSPITAL_COMMUNITY): Payer: Self-pay | Admitting: *Deleted

## 2016-08-22 ENCOUNTER — Ambulatory Visit: Payer: Medicare Other | Admitting: Neurology

## 2016-08-22 ENCOUNTER — Encounter (HOSPITAL_COMMUNITY)
Admission: RE | Disposition: A | Discharge status: Skilled Nursing Facility | Payer: Self-pay | Source: Ambulatory Visit | Attending: Orthopedic Surgery

## 2016-08-22 ENCOUNTER — Inpatient Hospital Stay (HOSPITAL_COMMUNITY): Payer: Medicare Other | Admitting: Emergency Medicine

## 2016-08-22 DIAGNOSIS — I447 Left bundle-branch block, unspecified: Secondary | ICD-10-CM | POA: Diagnosis present

## 2016-08-22 DIAGNOSIS — M79609 Pain in unspecified limb: Secondary | ICD-10-CM | POA: Diagnosis not present

## 2016-08-22 DIAGNOSIS — Z86711 Personal history of pulmonary embolism: Secondary | ICD-10-CM | POA: Diagnosis not present

## 2016-08-22 DIAGNOSIS — E785 Hyperlipidemia, unspecified: Secondary | ICD-10-CM | POA: Diagnosis present

## 2016-08-22 DIAGNOSIS — F411 Generalized anxiety disorder: Secondary | ICD-10-CM | POA: Diagnosis present

## 2016-08-22 DIAGNOSIS — Z79899 Other long term (current) drug therapy: Secondary | ICD-10-CM

## 2016-08-22 DIAGNOSIS — Z6841 Body Mass Index (BMI) 40.0 and over, adult: Secondary | ICD-10-CM | POA: Diagnosis not present

## 2016-08-22 DIAGNOSIS — Z7984 Long term (current) use of oral hypoglycemic drugs: Secondary | ICD-10-CM | POA: Diagnosis not present

## 2016-08-22 DIAGNOSIS — Z86718 Personal history of other venous thrombosis and embolism: Secondary | ICD-10-CM | POA: Diagnosis not present

## 2016-08-22 DIAGNOSIS — Z85830 Personal history of malignant neoplasm of bone: Secondary | ICD-10-CM | POA: Diagnosis not present

## 2016-08-22 DIAGNOSIS — M1710 Unilateral primary osteoarthritis, unspecified knee: Secondary | ICD-10-CM | POA: Diagnosis present

## 2016-08-22 DIAGNOSIS — I110 Hypertensive heart disease with heart failure: Secondary | ICD-10-CM | POA: Diagnosis present

## 2016-08-22 DIAGNOSIS — K219 Gastro-esophageal reflux disease without esophagitis: Secondary | ICD-10-CM | POA: Diagnosis present

## 2016-08-22 DIAGNOSIS — M5010 Cervical disc disorder with radiculopathy, unspecified cervical region: Secondary | ICD-10-CM | POA: Diagnosis present

## 2016-08-22 DIAGNOSIS — G4733 Obstructive sleep apnea (adult) (pediatric): Secondary | ICD-10-CM | POA: Diagnosis present

## 2016-08-22 DIAGNOSIS — M1712 Unilateral primary osteoarthritis, left knee: Secondary | ICD-10-CM | POA: Diagnosis not present

## 2016-08-22 DIAGNOSIS — I5032 Chronic diastolic (congestive) heart failure: Secondary | ICD-10-CM | POA: Diagnosis present

## 2016-08-22 DIAGNOSIS — E119 Type 2 diabetes mellitus without complications: Secondary | ICD-10-CM | POA: Diagnosis present

## 2016-08-22 DIAGNOSIS — Z87891 Personal history of nicotine dependence: Secondary | ICD-10-CM

## 2016-08-22 DIAGNOSIS — H409 Unspecified glaucoma: Secondary | ICD-10-CM | POA: Diagnosis present

## 2016-08-22 DIAGNOSIS — J45909 Unspecified asthma, uncomplicated: Secondary | ICD-10-CM | POA: Diagnosis present

## 2016-08-22 DIAGNOSIS — M7989 Other specified soft tissue disorders: Secondary | ICD-10-CM | POA: Diagnosis not present

## 2016-08-22 HISTORY — PX: TOTAL KNEE ARTHROPLASTY: SHX125

## 2016-08-22 LAB — GLUCOSE, CAPILLARY
Glucose-Capillary: 161 mg/dL — ABNORMAL HIGH (ref 65–99)
Glucose-Capillary: 128 mg/dL — ABNORMAL HIGH (ref 65–99)
Glucose-Capillary: 119 mg/dL — ABNORMAL HIGH (ref 65–99)

## 2016-08-22 SURGERY — ARTHROPLASTY, KNEE, TOTAL
Anesthesia: Monitor Anesthesia Care | Site: Knee | Laterality: Left

## 2016-08-22 MED ORDER — BUPIVACAINE IN DEXTROSE 0.75-8.25 % IT SOLN
INTRATHECAL | Status: DC | PRN
  Administered 2016-08-22: 1.8 mL via INTRATHECAL

## 2016-08-22 MED ORDER — PROPOFOL 10 MG/ML IV BOLUS
INTRAVENOUS | Status: AC
  Filled 2016-08-22: qty 20

## 2016-08-22 MED ORDER — ACETAMINOPHEN 650 MG RE SUPP: 650.0000 mg | Freq: Four times a day (QID) | RECTAL | Status: DC | PRN

## 2016-08-22 MED ORDER — PROPOFOL 500 MG/50ML IV EMUL
INTRAVENOUS | Status: DC | PRN
  Administered 2016-08-22: 50 ug/kg/min via INTRAVENOUS

## 2016-08-22 MED ORDER — PANTOPRAZOLE SODIUM 40 MG PO TBEC
40.0000 mg | DELAYED_RELEASE_TABLET | Freq: Every day | ORAL | Status: DC
  Administered 2016-08-23 – 2016-08-25 (×3): 40 mg via ORAL
  Filled 2016-08-22 (×3): qty 1

## 2016-08-22 MED ORDER — GABAPENTIN 300 MG PO CAPS
600.0000 mg | ORAL_CAPSULE | Freq: Three times a day (TID) | ORAL | Status: DC
  Administered 2016-08-22 – 2016-08-25 (×9): 600 mg via ORAL
  Filled 2016-08-22 (×8): qty 2

## 2016-08-22 MED ORDER — LINAGLIPTIN 5 MG PO TABS
5.0000 mg | ORAL_TABLET | Freq: Every day | ORAL | Status: DC
  Administered 2016-08-23 – 2016-08-25 (×3): 5 mg via ORAL
  Filled 2016-08-22 (×3): qty 1

## 2016-08-22 MED ORDER — BUPIVACAINE HCL (PF) 0.5 % IJ SOLN
INTRAMUSCULAR | Status: DC | PRN
  Administered 2016-08-22: 30 mL

## 2016-08-22 MED ORDER — BUPIVACAINE HCL (PF) 0.5 % IJ SOLN
INTRAMUSCULAR | Status: AC
  Filled 2016-08-22: qty 30

## 2016-08-22 MED ORDER — CHLORHEXIDINE GLUCONATE 4 % EX LIQD: 60.0000 mL | Freq: Once | CUTANEOUS | Status: DC

## 2016-08-22 MED ORDER — OXYCODONE HCL 5 MG PO TABS
5.0000 mg | ORAL_TABLET | ORAL | Status: DC | PRN
  Administered 2016-08-22 (×3): 10 mg via ORAL
  Administered 2016-08-23: 5 mg via ORAL
  Administered 2016-08-23 (×3): 10 mg via ORAL
  Administered 2016-08-23: 5 mg via ORAL
  Administered 2016-08-24 – 2016-08-25 (×5): 10 mg via ORAL
  Filled 2016-08-22 (×12): qty 2

## 2016-08-22 MED ORDER — CLONIDINE HCL (ANALGESIA) 100 MCG/ML EP SOLN
150.0000 ug | Freq: Once | EPIDURAL | Status: AC
  Administered 2016-08-22: 1 mL via INTRA_ARTICULAR
  Filled 2016-08-22: qty 1.5

## 2016-08-22 MED ORDER — NITROGLYCERIN 0.4 MG SL SUBL: 0.4000 mg | SUBLINGUAL_TABLET | SUBLINGUAL | Status: DC | PRN

## 2016-08-22 MED ORDER — SERTRALINE HCL 100 MG PO TABS
100.0000 mg | ORAL_TABLET | Freq: Every day | ORAL | Status: DC
  Administered 2016-08-23 – 2016-08-25 (×3): 100 mg via ORAL
  Filled 2016-08-22 (×4): qty 1

## 2016-08-22 MED ORDER — VANCOMYCIN HCL IN DEXTROSE 1-5 GM/200ML-% IV SOLN
1000.0000 mg | Freq: Two times a day (BID) | INTRAVENOUS | Status: AC
  Administered 2016-08-22: 1000 mg via INTRAVENOUS
  Filled 2016-08-22: qty 200

## 2016-08-22 MED ORDER — MENTHOL 3 MG MT LOZG: 1.0000 | LOZENGE | OROMUCOSAL | Status: DC | PRN

## 2016-08-22 MED ORDER — BUPIVACAINE LIPOSOME 1.3 % IJ SUSP
20.0000 mL | INTRAMUSCULAR | Status: AC
  Administered 2016-08-22: 20 mL
  Filled 2016-08-22: qty 20

## 2016-08-22 MED ORDER — SAXAGLIPTIN-METFORMIN ER 5-500 MG PO TB24: 1.0000 | ORAL_TABLET | ORAL | Status: DC

## 2016-08-22 MED ORDER — BENZONATATE 100 MG PO CAPS: 200.0000 mg | ORAL_CAPSULE | Freq: Three times a day (TID) | ORAL | Status: DC | PRN

## 2016-08-22 MED ORDER — DIPHENHYDRAMINE HCL 25 MG PO CAPS
25.0000 mg | ORAL_CAPSULE | Freq: Four times a day (QID) | ORAL | Status: DC | PRN
  Administered 2016-08-25: 25 mg via ORAL
  Filled 2016-08-22: qty 1

## 2016-08-22 MED ORDER — POTASSIUM CHLORIDE IN NACL 20-0.9 MEQ/L-% IV SOLN
INTRAVENOUS | Status: AC
  Administered 2016-08-22 – 2016-08-23 (×2): via INTRAVENOUS
  Filled 2016-08-22 (×2): qty 1000

## 2016-08-22 MED ORDER — CARVEDILOL PHOSPHATE ER 10 MG PO CP24
10.0000 mg | ORAL_CAPSULE | Freq: Every day | ORAL | Status: DC
  Administered 2016-08-23 – 2016-08-25 (×3): 10 mg via ORAL
  Filled 2016-08-22 (×3): qty 1

## 2016-08-22 MED ORDER — MIDAZOLAM HCL 5 MG/5ML IJ SOLN
INTRAMUSCULAR | Status: DC | PRN
  Administered 2016-08-22: 1 mg via INTRAVENOUS

## 2016-08-22 MED ORDER — METFORMIN HCL ER 500 MG PO TB24
500.0000 mg | ORAL_TABLET | Freq: Every day | ORAL | Status: DC
  Administered 2016-08-23 – 2016-08-25 (×3): 500 mg via ORAL
  Filled 2016-08-22 (×3): qty 1

## 2016-08-22 MED ORDER — MORPHINE SULFATE (PF) 4 MG/ML IV SOLN
INTRAVENOUS | Status: AC
  Filled 2016-08-22: qty 1

## 2016-08-22 MED ORDER — ALBUTEROL SULFATE (2.5 MG/3ML) 0.083% IN NEBU: 3.0000 mL | INHALATION_SOLUTION | RESPIRATORY_TRACT | Status: DC | PRN

## 2016-08-22 MED ORDER — ONDANSETRON HCL 4 MG/2ML IJ SOLN
4.0000 mg | Freq: Four times a day (QID) | INTRAMUSCULAR | Status: DC | PRN
  Filled 2016-08-22: qty 2

## 2016-08-22 MED ORDER — MORPHINE SULFATE (PF) 4 MG/ML IV SOLN
INTRAVENOUS | Status: DC | PRN
  Administered 2016-08-22: 8 mg

## 2016-08-22 MED ORDER — PHENOL 1.4 % MT LIQD: 1.0000 | OROMUCOSAL | Status: DC | PRN

## 2016-08-22 MED ORDER — CLINDAMYCIN PHOSPHATE 900 MG/50ML IV SOLN
900.0000 mg | INTRAVENOUS | Status: AC
  Administered 2016-08-22: 900 mg via INTRAVENOUS
  Filled 2016-08-22: qty 50

## 2016-08-22 MED ORDER — METOCLOPRAMIDE HCL 5 MG PO TABS: 5.0000 mg | ORAL_TABLET | Freq: Three times a day (TID) | ORAL | Status: DC | PRN

## 2016-08-22 MED ORDER — ATORVASTATIN CALCIUM 40 MG PO TABS
40.0000 mg | ORAL_TABLET | Freq: Every day | ORAL | Status: DC
  Administered 2016-08-22 – 2016-08-24 (×3): 40 mg via ORAL
  Filled 2016-08-22 (×3): qty 1

## 2016-08-22 MED ORDER — ACETAMINOPHEN 10 MG/ML IV SOLN
INTRAVENOUS | Status: AC
  Filled 2016-08-22: qty 100

## 2016-08-22 MED ORDER — PHENYLEPHRINE HCL 10 MG/ML IJ SOLN
INTRAVENOUS | Status: DC | PRN
  Administered 2016-08-22: 25 ug/min via INTRAVENOUS

## 2016-08-22 MED ORDER — POTASSIUM CHLORIDE CRYS ER 10 MEQ PO TBCR
10.0000 meq | EXTENDED_RELEASE_TABLET | Freq: Two times a day (BID) | ORAL | Status: DC
  Administered 2016-08-22 – 2016-08-25 (×6): 10 meq via ORAL
  Filled 2016-08-22 (×6): qty 1

## 2016-08-22 MED ORDER — ONDANSETRON HCL 4 MG PO TABS: 4.0000 mg | ORAL_TABLET | Freq: Four times a day (QID) | ORAL | Status: DC | PRN

## 2016-08-22 MED ORDER — CYCLOBENZAPRINE HCL 10 MG PO TABS
ORAL_TABLET | ORAL | Status: AC
  Filled 2016-08-22: qty 1

## 2016-08-22 MED ORDER — IPRATROPIUM-ALBUTEROL 0.5-2.5 (3) MG/3ML IN SOLN: 3.0000 mL | RESPIRATORY_TRACT | Status: DC | PRN

## 2016-08-22 MED ORDER — CYCLOBENZAPRINE HCL 5 MG PO TABS
5.0000 mg | ORAL_TABLET | Freq: Two times a day (BID) | ORAL | Status: DC
  Administered 2016-08-22 – 2016-08-25 (×7): 5 mg via ORAL
  Filled 2016-08-22 (×6): qty 1

## 2016-08-22 MED ORDER — LACTATED RINGERS IV SOLN
INTRAVENOUS | Status: DC
  Administered 2016-08-22 (×2): via INTRAVENOUS

## 2016-08-22 MED ORDER — MIDAZOLAM HCL 2 MG/2ML IJ SOLN
INTRAMUSCULAR | Status: AC
  Filled 2016-08-22: qty 2

## 2016-08-22 MED ORDER — HYDROMORPHONE HCL 1 MG/ML IJ SOLN
0.2500 mg | INTRAMUSCULAR | Status: DC | PRN
  Administered 2016-08-22: 0.5 mg via INTRAVENOUS

## 2016-08-22 MED ORDER — MORPHINE SULFATE (PF) 4 MG/ML IV SOLN
4.0000 mg | INTRAVENOUS | Status: DC | PRN
  Administered 2016-08-22 – 2016-08-24 (×8): 4 mg via INTRAVENOUS
  Filled 2016-08-22 (×8): qty 1

## 2016-08-22 MED ORDER — ACETAMINOPHEN 325 MG PO TABS
650.0000 mg | ORAL_TABLET | Freq: Four times a day (QID) | ORAL | Status: DC | PRN
  Administered 2016-08-24: 650 mg via ORAL
  Filled 2016-08-22: qty 2

## 2016-08-22 MED ORDER — 0.9 % SODIUM CHLORIDE (POUR BTL) OPTIME
TOPICAL | Status: DC | PRN
  Administered 2016-08-22: 1000 mL

## 2016-08-22 MED ORDER — OXYCODONE HCL 5 MG PO TABS
ORAL_TABLET | ORAL | Status: AC
Start: 2016-08-22 — End: 2016-08-22
  Filled 2016-08-22: qty 2

## 2016-08-22 MED ORDER — LISINOPRIL 20 MG PO TABS
20.0000 mg | ORAL_TABLET | Freq: Every day | ORAL | Status: DC
  Administered 2016-08-23 – 2016-08-25 (×3): 20 mg via ORAL
  Filled 2016-08-22 (×3): qty 1

## 2016-08-22 MED ORDER — SALINE FLUSH 0.9 % IV SOLN
INTRAVENOUS | Status: DC | PRN
  Administered 2016-08-22: 40 mL

## 2016-08-22 MED ORDER — FUROSEMIDE 40 MG PO TABS
40.0000 mg | ORAL_TABLET | Freq: Every day | ORAL | Status: DC
  Administered 2016-08-23 – 2016-08-25 (×3): 40 mg via ORAL
  Filled 2016-08-22 (×3): qty 1

## 2016-08-22 MED ORDER — METOCLOPRAMIDE HCL 5 MG/ML IJ SOLN: 5.0000 mg | Freq: Three times a day (TID) | INTRAMUSCULAR | Status: DC | PRN

## 2016-08-22 MED ORDER — MOMETASONE FURO-FORMOTEROL FUM 200-5 MCG/ACT IN AERO
2.0000 | INHALATION_SPRAY | Freq: Two times a day (BID) | RESPIRATORY_TRACT | Status: DC
  Administered 2016-08-22 – 2016-08-25 (×6): 2 via RESPIRATORY_TRACT
  Filled 2016-08-22: qty 8.8

## 2016-08-22 MED ORDER — OXYCODONE HCL 5 MG PO TABS
5.0000 mg | ORAL_TABLET | Freq: Once | ORAL | Status: DC | PRN
Start: 2016-08-22 — End: 2016-08-22

## 2016-08-22 MED ORDER — RIVAROXABAN 10 MG PO TABS
10.0000 mg | ORAL_TABLET | Freq: Every day | ORAL | Status: DC
  Administered 2016-08-23 – 2016-08-25 (×3): 10 mg via ORAL
  Filled 2016-08-22 (×3): qty 1

## 2016-08-22 MED ORDER — ONDANSETRON HCL 4 MG/2ML IJ SOLN
INTRAMUSCULAR | Status: AC
  Filled 2016-08-22: qty 2

## 2016-08-22 MED ORDER — OXYCODONE HCL 5 MG/5ML PO SOLN: 5.0000 mg | Freq: Once | ORAL | Status: DC | PRN

## 2016-08-22 MED ORDER — SODIUM CHLORIDE 0.9 % IR SOLN
Status: DC | PRN
  Administered 2016-08-22: 3000 mL

## 2016-08-22 MED ORDER — HYDROMORPHONE HCL 1 MG/ML IJ SOLN
INTRAMUSCULAR | Status: AC
Start: 2016-08-22 — End: 2016-08-22
  Filled 2016-08-22: qty 0.5

## 2016-08-22 MED ORDER — ISOSORBIDE DINITRATE 20 MG PO TABS
20.0000 mg | ORAL_TABLET | Freq: Two times a day (BID) | ORAL | Status: DC
  Administered 2016-08-22 – 2016-08-25 (×5): 20 mg via ORAL
  Filled 2016-08-22 (×6): qty 1

## 2016-08-22 MED ORDER — SODIUM CHLORIDE 0.9 % IV SOLN
2000.0000 mg | INTRAVENOUS | Status: AC
  Administered 2016-08-22: 2000 mg via TOPICAL
  Filled 2016-08-22: qty 20

## 2016-08-22 MED ORDER — FENTANYL CITRATE (PF) 250 MCG/5ML IJ SOLN
INTRAMUSCULAR | Status: AC
  Filled 2016-08-22: qty 5

## 2016-08-22 MED ORDER — FENTANYL CITRATE (PF) 100 MCG/2ML IJ SOLN
INTRAMUSCULAR | Status: DC | PRN
  Administered 2016-08-22: 50 ug via INTRAVENOUS

## 2016-08-22 MED ORDER — ACETAMINOPHEN 10 MG/ML IV SOLN
INTRAVENOUS | Status: DC | PRN
  Administered 2016-08-22: 1000 mg via INTRAVENOUS

## 2016-08-22 MED ORDER — ONDANSETRON HCL 4 MG/2ML IJ SOLN
INTRAMUSCULAR | Status: DC | PRN
  Administered 2016-08-22: 4 mg via INTRAVENOUS

## 2016-08-22 MED ORDER — BUPIVACAINE-EPINEPHRINE (PF) 0.5% -1:200000 IJ SOLN
INTRAMUSCULAR | Status: AC
  Filled 2016-08-22: qty 30

## 2016-08-22 MED FILL — Sodium Chloride Flush IV Soln 0.9%: INTRAVENOUS | Qty: 40 | Status: AC

## 2016-08-22 SURGICAL SUPPLY — 74 items
BANDAGE ACE 4X5 VEL STRL LF (GAUZE/BANDAGES/DRESSINGS) ×2 IMPLANT
BANDAGE ACE 6X5 VEL STRL LF (GAUZE/BANDAGES/DRESSINGS) ×2 IMPLANT
BANDAGE ESMARK 6X9 LF (GAUZE/BANDAGES/DRESSINGS) ×1 IMPLANT
BLADE SAG 18X100X1.27 (BLADE) ×2 IMPLANT
BLADE SAW SGTL 13.0X1.19X90.0M (BLADE) ×2 IMPLANT
BNDG COHESIVE 6X5 TAN STRL LF (GAUZE/BANDAGES/DRESSINGS) ×2 IMPLANT
BNDG ELASTIC 6X15 VLCR STRL LF (GAUZE/BANDAGES/DRESSINGS) ×6 IMPLANT
BNDG ESMARK 6X9 LF (GAUZE/BANDAGES/DRESSINGS) ×2
BOWL SMART MIX CTS (DISPOSABLE) IMPLANT
CAPT KNEE TRIATH TK-4 ×2 IMPLANT
CLSR STERI-STRIP ANTIMIC 1/2X4 (GAUZE/BANDAGES/DRESSINGS) ×2 IMPLANT
COVER SURGICAL LIGHT HANDLE (MISCELLANEOUS) ×2 IMPLANT
CUFF TOURNIQUET SINGLE 34IN LL (TOURNIQUET CUFF) ×2 IMPLANT
CUFF TOURNIQUET SINGLE 44IN (TOURNIQUET CUFF) IMPLANT
DECANTER SPIKE VIAL GLASS SM (MISCELLANEOUS) ×2 IMPLANT
DRAPE INCISE IOBAN 66X45 STRL (DRAPES) ×2 IMPLANT
DRAPE ORTHO SPLIT 77X108 STRL (DRAPES) ×3
DRAPE SURG ORHT 6 SPLT 77X108 (DRAPES) ×3 IMPLANT
DRAPE U-SHAPE 47X51 STRL (DRAPES) ×2 IMPLANT
DRSG AQUACEL AG ADV 3.5X10 (GAUZE/BANDAGES/DRESSINGS) ×2 IMPLANT
DRSG AQUACEL AG ADV 3.5X14 (GAUZE/BANDAGES/DRESSINGS) IMPLANT
DRSG PAD ABDOMINAL 8X10 ST (GAUZE/BANDAGES/DRESSINGS) ×2 IMPLANT
DURAPREP 26ML APPLICATOR (WOUND CARE) ×4 IMPLANT
ELECT CAUTERY BLADE 6.4 (BLADE) ×2 IMPLANT
ELECT REM PT RETURN 9FT ADLT (ELECTROSURGICAL) ×2
ELECTRODE REM PT RTRN 9FT ADLT (ELECTROSURGICAL) ×1 IMPLANT
GAUZE SPONGE 4X4 12PLY STRL (GAUZE/BANDAGES/DRESSINGS) ×2 IMPLANT
GLOVE BIOGEL PI IND STRL 7.5 (GLOVE) ×1 IMPLANT
GLOVE BIOGEL PI IND STRL 8 (GLOVE) ×1 IMPLANT
GLOVE BIOGEL PI INDICATOR 7.5 (GLOVE) ×1
GLOVE BIOGEL PI INDICATOR 8 (GLOVE) ×1
GLOVE ECLIPSE 7.0 STRL STRAW (GLOVE) IMPLANT
GLOVE SURG ORTHO 8.0 STRL STRW (GLOVE) IMPLANT
GLOVE SURG SS PI 7.0 STRL IVOR (GLOVE) ×2 IMPLANT
GLOVE SURG SS PI 8.0 STRL IVOR (GLOVE) ×2 IMPLANT
GOWN STRL REUS W/ TWL LRG LVL3 (GOWN DISPOSABLE) ×3 IMPLANT
GOWN STRL REUS W/TWL LRG LVL3 (GOWN DISPOSABLE) ×3
HANDPIECE INTERPULSE COAX TIP (DISPOSABLE) ×1
HOOD PEEL AWAY FLYTE STAYCOOL (MISCELLANEOUS) ×6 IMPLANT
IMMOBILIZER KNEE 20 (SOFTGOODS) IMPLANT
IMMOBILIZER KNEE 22 UNIV (SOFTGOODS) ×2 IMPLANT
IMMOBILIZER KNEE 24 THIGH 36 (MISCELLANEOUS) IMPLANT
IMMOBILIZER KNEE 24 UNIV (MISCELLANEOUS)
KIT BASIN OR (CUSTOM PROCEDURE TRAY) ×2 IMPLANT
KIT ROOM TURNOVER OR (KITS) ×2 IMPLANT
MANIFOLD NEPTUNE II (INSTRUMENTS) ×2 IMPLANT
NDL SAFETY ECLIPSE 18X1.5 (NEEDLE) ×1 IMPLANT
NEEDLE HYPO 18GX1.5 SHARP (NEEDLE) ×1
NEEDLE HYPO 22GX1.5 SAFETY (NEEDLE) ×2 IMPLANT
NEEDLE SPNL 18GX3.5 QUINCKE PK (NEEDLE) ×2 IMPLANT
NS IRRIG 1000ML POUR BTL (IV SOLUTION) ×4 IMPLANT
PACK TOTAL JOINT (CUSTOM PROCEDURE TRAY) ×2 IMPLANT
PAD ARMBOARD 7.5X6 YLW CONV (MISCELLANEOUS) ×4 IMPLANT
PAD CAST 4YDX4 CTTN HI CHSV (CAST SUPPLIES) ×1 IMPLANT
PADDING CAST COTTON 4X4 STRL (CAST SUPPLIES) ×1
PADDING CAST COTTON 6X4 STRL (CAST SUPPLIES) ×2 IMPLANT
SET HNDPC FAN SPRY TIP SCT (DISPOSABLE) ×1 IMPLANT
SPONGE LAP 18X18 X RAY DECT (DISPOSABLE) ×2 IMPLANT
STRIP CLOSURE SKIN 1/2X4 (GAUZE/BANDAGES/DRESSINGS) ×4 IMPLANT
SUCTION FRAZIER HANDLE 10FR (MISCELLANEOUS) ×1
SUCTION TUBE FRAZIER 10FR DISP (MISCELLANEOUS) ×1 IMPLANT
SUT MNCRL AB 3-0 PS2 18 (SUTURE) ×2 IMPLANT
SUT VIC AB 0 CT1 27 (SUTURE) ×3
SUT VIC AB 0 CT1 27XBRD ANBCTR (SUTURE) ×3 IMPLANT
SUT VIC AB 1 CT1 27 (SUTURE) ×5
SUT VIC AB 1 CT1 27XBRD ANBCTR (SUTURE) ×5 IMPLANT
SUT VIC AB 2-0 CT1 27 (SUTURE) ×4
SUT VIC AB 2-0 CT1 TAPERPNT 27 (SUTURE) ×4 IMPLANT
SYR 30ML LL (SYRINGE) ×6 IMPLANT
SYR TB 1ML LUER SLIP (SYRINGE) ×2 IMPLANT
TOWEL OR 17X24 6PK STRL BLUE (TOWEL DISPOSABLE) ×4 IMPLANT
TOWEL OR 17X26 10 PK STRL BLUE (TOWEL DISPOSABLE) ×4 IMPLANT
TRAY CATH 16FR W/PLASTIC CATH (SET/KITS/TRAYS/PACK) IMPLANT
WATER STERILE IRR 1000ML POUR (IV SOLUTION) IMPLANT

## 2016-08-22 NOTE — Op Note (Signed)
NAME:  [name], [name]                ACCOUNT NO.:  MEDICAL RECORD NO.:  [id number]  LOCATION:                                 FACILITY:  PHYSICIAN:  Anderson Malta, M.D.    DATE OF BIRTH:  [birth year]  DATE OF PROCEDURE: DATE OF DISCHARGE:                              OPERATIVE REPORT   PREOPERATIVE DIAGNOSIS:  Left knee arthritis.  POSTOPERATIVE DIAGNOSIS:  Left knee arthritis.  PROCEDURE:  Left total knee replacement using Stryker triathlon press- fit 2 femur, 3 tibia, 13 mm polyethylene insert, 32 mm 3-peg press-fit patella, posterior cruciate retaining.  SURGEON:  Anderson Malta, M.D.  ASSISTANT:  Laure Kidney, RNFA.  INDICATIONS:  [name] is a 56 year old patient with end-stage left knee arthritis, who presents for operative management after explanation of risks and benefits.  PROCEDURE IN DETAIL:  The patient was brought to the operating room, where a spinal anesthetic was induced.  Preoperative antibiotics were administered.  Time-out was called.  Left leg was prescrubbed with alcohol and Betadine, allowed to air dry, prepped with DuraPrep solution and draped in sterile manner.  Ioban used to cover the operative field. Time-out was called.  Left leg elevated, exsanguinated with Esmarch wrap, was inflated to 300 mmHg.  Total tourniquet time 92 minutes. Anterior approach to the knee was made.  Skin and subcu tissue were sharply divided.  Median parapatellar approach was made and marked with a #1 Vicryl suture.  Significant arthritis present in all 3 compartments.  Fat pad partially excised.  Lateral patellofemoral ligament released.  Soft tissue anterior distal femur removed.  Soft tissue sleeve elevated medially off the preoperatively varus knee.  The patient also had about a 7-degree flexion contracture preoperatively. Following exposure, ACL was released.  PCL was preserved.  The initial tibial cut was made perpendicular to the mechanical axis taking predominantly  more lateral tibial plateau and the medial tibial plateau. Because the patient's bone quality was good, re-cut was made taking 2 more millimeters in order to minimize bony contact with sclerotic medial bone.  Cut was then made on the femur, 5 degrees valgus, 8 mm.  Spacer was placed.  Size 11 gave full extension and good stability to varus and valgus stress.  At this time, chamfer cuts were made on the femur.  The tibia was keel punched.  The patella was then cut from 24 down to 13 and a trial patellar button was placed.  With trial components in position, the knee achieved full extension and full flexion with no lift-off, and excellent patellar tracking using no thumbs technique.  Trial components removed.  Press-fit completed on the tibia.  The knee joint was then thoroughly irrigated with 3 liters of irrigating solution.  Exparel then placed within the soft tissue and capsule.  Tranexamic acid sponge was then allowed to sit for 3 minutes within the knee.  This was removed. True components were then placed into position.  Excellent press-fit was achieved.  Thorough irrigation was then performed.  Tourniquet was released and bleeding points encountered, controlled using electrocautery.  Incision was then closed over bolster using #1 Vicryl suture, 0 Vicryl suture, 2-0 Vicryl suture, and a running  3-0 Monocryl with Steri- Strips and an Aquacel dressing, followed by Ace wrap and knee immobilizer.  The patient tolerated the procedure well without immediate complications.  Transferred to the recovery room in stable condition.     Anderson Malta, M.D.     GSD/MEDQ  D:  08/22/2016  T:  08/22/2016  Job:  [id number]

## 2016-08-22 NOTE — Transfer of Care (Signed)
Immediate Anesthesia Transfer of Care Note  Patient: [name]  Procedure(s) Performed: Procedure(s): TOTAL KNEE ARTHROPLASTY (Left)  Patient Location: PACU  Anesthesia Type:MAC and Spinal  Level of Consciousness: awake, alert  and oriented  Airway & Oxygen Therapy: Patient Spontanous Breathing and Patient connected to face mask oxygen  Post-op Assessment: Report given to RN and Post -op Vital signs reviewed and stable  Post vital signs: Reviewed and stable  Last Vitals:  Vitals:   08/22/16 0613  Pulse: 85  Resp: 20  Temp: 36.6 C    Last Pain:  Vitals:   08/22/16 0613  TempSrc: Oral      Patients Stated Pain Goal: 3 (34/19/62 2297)  Complications: No apparent anesthesia complications

## 2016-08-22 NOTE — Progress Notes (Signed)
Pt does have remote history of pe/dvt over 10 years ago. Will not use iv txa but will use topical txa and start xarelto tonight. Check u/s prior to dc.

## 2016-08-22 NOTE — Anesthesia Preprocedure Evaluation (Deleted)
Anesthesia Evaluation  Patient identified by MRN, date of birth, ID band Patient awake    Reviewed: Allergy & Precautions, NPO status , Patient's Chart, lab work & pertinent test results  History of Anesthesia Complications Negative for: history of anesthetic complications  Airway Mallampati: II  TM Distance: >3 FB Neck ROM: Full    Dental  (+) Teeth Intact,    Pulmonary shortness of breath, asthma , sleep apnea , former smoker,    breath sounds clear to auscultation       Cardiovascular hypertension, +CHF   Rhythm:Regular     Neuro/Psych  Headaches, Anxiety Depression  Neuromuscular disease    GI/Hepatic GERD  Medicated and Controlled,  Endo/Other  diabetes, Type 2, Oral Hypoglycemic AgentsMorbid obesity  Renal/GU      Musculoskeletal  (+) Arthritis ,   Abdominal   Peds  Hematology   Anesthesia Other Findings   Reproductive/Obstetrics                             Anesthesia Physical Anesthesia Plan  ASA: III  Anesthesia Plan: MAC, Regional and Spinal   Post-op Pain Management:  Regional for Post-op pain   Induction:   Airway Management Planned: Natural Airway, Nasal Cannula and Simple Face Mask  Additional Equipment: None  Intra-op Plan:   Post-operative Plan:   Informed Consent: I have reviewed the patients History and Physical, chart, labs and discussed the procedure including the risks, benefits and alternatives for the proposed anesthesia with the patient or authorized representative who has indicated his/her understanding and acceptance.   Dental advisory given  Plan Discussed with: CRNA and Surgeon  Anesthesia Plan Comments:         Anesthesia Quick Evaluation

## 2016-08-22 NOTE — Telephone Encounter (Signed)
Nurse from [facility] requested an order for Benadryl for pt as she is itching from the pain meds please.  [place]

## 2016-08-22 NOTE — Discharge Instructions (Signed)
Information on my medicine - XARELTO (Rivaroxaban)   Why was Xarelto prescribed for you? Xarelto was prescribed for you to reduce the risk of blood clots forming after orthopedic surgery. The medical term for these abnormal blood clots is venous thromboembolism (VTE).  What do you need to know about xarelto ? Take your Xarelto ONCE DAILY at the same time every day. You may take it either with or without food.  If you have difficulty swallowing the tablet whole, you may crush it and mix in applesauce just prior to taking your dose.  Take Xarelto exactly as prescribed by your doctor and DO NOT stop taking Xarelto without talking to the doctor who prescribed the medication.  Stopping without other VTE prevention medication to take the place of Xarelto may increase your risk of developing a clot.  After discharge, you should have regular check-up appointments with your healthcare provider that is prescribing your Xarelto.    What do you do if you miss a dose? If you miss a dose, take it as soon as you remember on the same day then continue your regularly scheduled once daily regimen the next day. Do not take two doses of Xarelto on the same day.   Important Safety Information A possible side effect of Xarelto is bleeding. You should call your healthcare provider right away if you experience any of the following: ? Bleeding from an injury or your nose that does not stop. ? Unusual colored urine (red or dark brown) or unusual colored stools (red or black). ? Unusual bruising for unknown reasons. ? A serious fall or if you hit your head (even if there is no bleeding).  Some medicines may interact with Xarelto and might increase your risk of bleeding while on Xarelto. To help avoid this, consult your healthcare provider or pharmacist prior to using any new prescription or non-prescription medications, including herbals, vitamins, non-steroidal anti-inflammatory drugs (NSAIDs) and  supplements.  This website has more information on Xarelto: www.xarelto.com.   

## 2016-08-22 NOTE — Anesthesia Procedure Notes (Signed)
Spinal  Patient location during procedure: OR Start time: 08/22/2016 7:39 AM End time: 08/22/2016 7:45 AM Staffing Anesthesiologist: Oleta Mouse Preanesthetic Checklist Completed: patient identified, surgical consent, pre-op evaluation, timeout performed, IV checked, risks and benefits discussed and monitors and equipment checked Spinal Block Patient position: sitting Prep: ChloraPrep and site prepped and draped Patient monitoring: heart rate, cardiac monitor, continuous pulse ox and blood pressure Approach: midline Location: L3-4 Injection technique: single-shot Needle Needle type: Pencan  Needle gauge: 24 G Needle length: 10 cm Assessment Sensory level: T6

## 2016-08-22 NOTE — Brief Op Note (Signed)
08/22/2016  10:41 AM  PATIENT:  Edwyna Ready  56 y.o. female  PRE-OPERATIVE DIAGNOSIS:  LEFT KNEE OSTEOARTHRITIS  POST-OPERATIVE DIAGNOSIS:  LEFT KNEE OSTEOARTHRITIS  PROCEDURE:  Procedure(s): TOTAL KNEE ARTHROPLASTY  SURGEON:  Surgeon(s): Meredith Pel, MD  ASSISTANT: Laure Kidney RNFA  ANESTHESIA:   spinal  EBL: 100 ml    Total I/O In: 1000 [I.V.:1000] Out: 75 [Blood:75]  BLOOD ADMINISTERED: none  DRAINS: none   LOCAL MEDICATIONS USED:  Marcaine mso4 clonidine exparel  SPECIMEN:  No Specimen  COUNTS:  YES  TOURNIQUET:   Total Tourniquet Time Documented: Thigh (Left) - 92 minutes Total: Thigh (Left) - 92 minutes   DICTATION: .Other Dictation: Dictation Number [id number]  PLAN OF CARE: Admit to inpatient   PATIENT DISPOSITION:  PACU - hemodynamically stable

## 2016-08-22 NOTE — Progress Notes (Signed)
Pt. Refused cpap for tonight. 

## 2016-08-22 NOTE — Progress Notes (Signed)
Orthopedic Tech Progress Note Patient Details:  [name] Oct 29, 1960 [id number]  CPM Left Knee CPM Left Knee: On Left Knee Flexion (Degrees): 40 Left Knee Extension (Degrees): 10 Additional Comments: foot roll   [place] Pink 08/22/2016, 11:19 AM

## 2016-08-22 NOTE — Anesthesia Procedure Notes (Signed)
Procedure Name: MAC Date/Time: 08/22/2016 7:36 AM Performed by: Candis Shine Pre-anesthesia Checklist: Patient identified, Emergency Drugs available, Suction available, Patient being monitored and Timeout performed Patient Re-evaluated:Patient Re-evaluated prior to inductionOxygen Delivery Method: Simple face mask Dental Injury: Teeth and Oropharynx as per pre-operative assessment

## 2016-08-22 NOTE — Evaluation (Signed)
Physical Therapy Evaluation Patient Details Name: [name] MRN: [id number] DOB: [birth year] Today's Date: 08/22/2016   History of Present Illness  Pt is a 56 yo female with Left knee pain secondary to end stage L knee OA, s/p L TKA 45/1/18. PMH significant for HTN, DM, CHF, DoE, CA and hx of PE.   Clinical Impression  Pt is s/p TKA resulting in the deficits listed below (see PT Problem List). Pt is minA for bed mobility, modA for transfers and minA for ambulation of 10 feet with RW. Pt will benefit from skilled PT to increase their independence and safety with mobility to allow discharge to the venue listed below.      Follow Up Recommendations SNF;Supervision/Assistance - 24 hour    Equipment Recommendations   (TBD at next venue)    Recommendations for Other Services OT consult     Precautions / Restrictions Precautions Precautions: Knee Required Braces or Orthoses: Knee Immobilizer - Left Knee Immobilizer - Left: On when out of bed or walking Restrictions Weight Bearing Restrictions: Yes LLE Weight Bearing: Weight bearing as tolerated      Mobility  Bed Mobility Overal bed mobility: Needs Assistance Bed Mobility: Supine to Sit     Supine to sit: Min assist     General bed mobility comments: minA required to bring hip EoB and manage LE to floor  Transfers Overall transfer level: Needs assistance Equipment used: Rolling walker (2 wheeled) Transfers: Stand Pivot Transfers;Sit to/from Stand Sit to Stand: Min assist Stand pivot transfers: Mod assist       General transfer comment: modA for stand pivot transfer to [facility], minA for power up and steadying at RW  Ambulation/Gait Ambulation/Gait assistance: Min assist Ambulation Distance (Feet): 10 Feet Assistive device: Rolling walker (2 wheeled) Gait Pattern/deviations: Step-to pattern;Decreased step length - right;Decreased stance time - left;Decreased dorsiflexion - left;Shuffle;Antalgic;Trunk flexed Gait  velocity: slowed Gait velocity interpretation: Below normal speed for age/gender General Gait Details: pt with decreased step length and slowed cadence, no LoB, no buckling, vc for upright posture and UE support to advance R LE       Balance Overall balance assessment: Needs assistance Sitting-balance support: No upper extremity supported Sitting balance-Leahy Scale: Fair Sitting balance - Comments: able to sit on BSC without UE support   Standing balance support: Bilateral upper extremity supported Standing balance-Leahy Scale: Fair Standing balance comment: Pt requires UE on RW for balance                             Pertinent Vitals/Pain Pain Assessment: 0-10 Pain Score: 7  Pain Location: L knee Pain Descriptors / Indicators: Operative site guarding;Guarding;Throbbing Pain Intervention(s): Monitored during session  VSS Pt on 1 L O2 via nasal cannula at entry, performed session on RA with SaO2 >90% O2 throughout. 1 L O2 via nasal cannula replaced after session.    Home Living Family/patient expects to be discharged to:: Private residence Living Arrangements: Alone Available Help at Discharge: Available PRN/intermittently;Family Type of Home: Apartment Home Access: Level entry     Home Layout: One level Home Equipment: Transport planner      Prior Function Level of Independence: Independent         Comments: requires motorized scooter in grocery scooter, drives, independent with ADLs, iADLs        Extremity/Trunk Assessment   Upper Extremity Assessment Upper Extremity Assessment: Defer to OT evaluation    Lower Extremity Assessment Lower  Extremity Assessment: LLE deficits/detail LLE Deficits / Details: L TKA pain limiting LLE ROM, strength grossly 3/5 LLE: Unable to fully assess due to pain;Unable to fully assess due to immobilization    Cervical / Trunk Assessment Cervical / Trunk Assessment: Normal  Communication   Communication: No  difficulties  Cognition Arousal/Alertness: Lethargic;Suspect due to medications Behavior During Therapy: [facility] for tasks assessed/performed Overall Cognitive Status: Within Functional Limits for tasks assessed                                 General Comments: pt woke up as session progressed      General Comments General comments (skin integrity, edema, etc.): Pt's daughter in room and helped with history     Exercises Total Joint Exercises Ankle Circles/Pumps: AROM;Both;20 reps;Seated Quad Sets: AROM;Left;10 reps;Seated Heel Slides: AROM;10 reps;Left   Assessment/Plan    PT Assessment Patient needs continued PT services  PT Problem List Decreased strength;Decreased range of motion;Decreased activity tolerance;Decreased balance;Decreased mobility;Decreased coordination;Decreased knowledge of use of DME;Decreased safety awareness;Obesity;Pain       PT Treatment Interventions DME instruction;Gait training;Stair training;Functional mobility training;Therapeutic activities;Therapeutic exercise;Balance training;Patient/family education    PT Goals (Current goals can be found in the Care Plan section)  Acute Rehab PT Goals Patient Stated Goal: go home soon PT Goal Formulation: With patient Time For Goal Achievement: 08/29/16 Potential to Achieve Goals: Good    Frequency 7X/week    AM-PAC PT "6 Clicks" Daily Activity  Outcome Measure Difficulty turning over in bed (including adjusting bedclothes, sheets and blankets)?: Total Difficulty moving from lying on back to sitting on the side of the bed? : Total Difficulty sitting down on and standing up from a chair with arms (e.g., wheelchair, bedside commode, etc,.)?: Total Help needed moving to and from a bed to chair (including a wheelchair)?: A Little Help needed walking in hospital room?: A Lot Help needed climbing 3-5 steps with a railing? : A Lot 6 Click Score: 10    End of Session Equipment Utilized During  Treatment: Gait belt Activity Tolerance: Patient limited by pain Patient left: in chair;with call bell/phone within reach;with family/visitor present Nurse Communication: Mobility status;Weight bearing status;Patient requests pain meds PT Visit Diagnosis: Unsteadiness on feet (R26.81);Other abnormalities of gait and mobility (R26.89);Muscle weakness (generalized) (M62.81);Pain Pain - Right/Left: Left Pain - part of body: Knee    Time: 1510-1549 PT Time Calculation (min) (ACUTE ONLY): 39 min   Charges:   PT Evaluation $PT Eval Low Complexity: 1 Procedure PT Treatments $Therapeutic Exercise: 8-22 mins $Therapeutic Activity: 8-22 mins   PT G Codes:        [name] B. Migdalia Dk PT, DPT Acute Rehabilitation  [phone] Pager [phone]    [place] 08/22/2016, 4:04 PM

## 2016-08-22 NOTE — Telephone Encounter (Signed)
Abigail Butts called and advised ok to give 25mg  oral tablet of Benadryl. FYI.

## 2016-08-22 NOTE — Anesthesia Preprocedure Evaluation (Addendum)
Anesthesia Evaluation  Patient identified by MRN, date of birth, ID band Patient awake    Reviewed: Allergy & Precautions, NPO status , Patient's Chart, lab work & pertinent test results  History of Anesthesia Complications Negative for: history of anesthetic complications  Airway Mallampati: II  TM Distance: >3 FB Neck ROM: Full    Dental  (+) Teeth Intact, Missing,    Pulmonary shortness of breath, asthma , sleep apnea and Continuous Positive Airway Pressure Ventilation , neg recent URI, former smoker,    breath sounds clear to auscultation- rhonchi       Cardiovascular hypertension, Pt. on medications + angina +CHF  (-) CAD  Rhythm:Regular     Neuro/Psych  Headaches, PSYCHIATRIC DISORDERS Anxiety Depression  Neuromuscular disease    GI/Hepatic Neg liver ROS, GERD  Medicated and Controlled,  Endo/Other  diabetes, Type 2, Oral Hypoglycemic AgentsMorbid obesity  Renal/GU negative Renal ROS     Musculoskeletal  (+) Arthritis ,   Abdominal   Peds  Hematology   Anesthesia Other Findings   Reproductive/Obstetrics                            Anesthesia Physical Anesthesia Plan  ASA: III  Anesthesia Plan: Spinal and MAC   Post-op Pain Management:    Induction:   Airway Management Planned: Natural Airway, Nasal Cannula and Simple Face Mask  Additional Equipment: None  Intra-op Plan:   Post-operative Plan:   Informed Consent: I have reviewed the patients History and Physical, chart, labs and discussed the procedure including the risks, benefits and alternatives for the proposed anesthesia with the patient or authorized representative who has indicated his/her understanding and acceptance.   Dental advisory given  Plan Discussed with: CRNA and Surgeon  Anesthesia Plan Comments:        Anesthesia Quick Evaluation

## 2016-08-22 NOTE — Progress Notes (Signed)
Patient has complaints of itching, will notify on call MD/PA.  Awaiting orders.  Will continue to monitor.

## 2016-08-22 NOTE — Interval H&P Note (Signed)
History and Physical Interval Note:  08/22/2016 7:19 AM  [name]  has presented today for surgery, with the diagnosis of LEFT KNEE OSTEOARTHRITIS  The various methods of treatment have been discussed with the patient and family. After consideration of risks, benefits and other options for treatment, the patient has consented to  Procedure(s): TOTAL KNEE ARTHROPLASTY (Left) as a surgical intervention .  The patient's history has been reviewed, patient examined, no change in status, stable for surgery.  I have reviewed the patient's chart and labs.  Questions were answered to the patient's satisfaction.     Anderson Malta

## 2016-08-22 NOTE — Progress Notes (Signed)
ANTICOAGULATION CONSULT NOTE - Initial Consult  Pharmacy Consult for Xarelto Indication: Ortho prophylaxis s/p TKA  Allergies  Allergen Reactions  . Penicillins Swelling    Has patient had a PCN reaction causing immediate rash, facial/tongue/throat swelling, SOB or lightheadedness with hypotension: YES Has patient had a PCN reaction causing severe rash involving mucus membranes or skin necrosis: NO Has patient had a PCN reaction that required hospitalization NO Has patient had a PCN reaction occurring within the last 10 years: NO If all of the above answers are "NO", then may proceed with Cephalosporin use.  . Latex Rash  . Sulfa Antibiotics Diarrhea, Itching and Rash    Assessment: 56 yo F presents on 4/30 with knee pain. Has hx of PE 10 years ago and was treated for 6 months on Coumadin. Now s/p TKA, to start Xarelto. CBC stable. If considering infefinite anticoag due to PE would recommend 20mg  daily, but since it was only one time event 10 years ago sticking with regular 10mg  ortho prophylaxis dose for a couple weeks will be ok.  Goal of Therapy:  Monitor platelets by anticoagulation protocol: Yes   Plan:  Start Xarelto 10mg  PO daily Monitor CBC, s/s of bleed  Elenor Quinones, PharmD, BCPS Clinical Pharmacist Pager [phone] 08/22/2016 1:17 PM

## 2016-08-23 ENCOUNTER — Encounter (HOSPITAL_COMMUNITY): Payer: Medicare Other

## 2016-08-23 ENCOUNTER — Encounter (HOSPITAL_COMMUNITY): Payer: Self-pay | Admitting: Orthopedic Surgery

## 2016-08-23 LAB — BASIC METABOLIC PANEL
Anion gap: 6 (ref 5–15)
BUN: 6 mg/dL (ref 6–20)
CO2: 28 mmol/L (ref 22–32)
Calcium: 8.4 mg/dL — ABNORMAL LOW (ref 8.9–10.3)
Chloride: 104 mmol/L (ref 101–111)
Creatinine, Ser: 0.76 mg/dL (ref 0.44–1.00)
GFR calc Af Amer: >60 mL/min (ref >60)
GFR calc non Af Amer: >60 mL/min (ref >60)
Glucose, Bld: 148 mg/dL — ABNORMAL HIGH (ref 65–99)
Potassium: 3.6 mmol/L (ref 3.5–5.1)
Sodium: 138 mmol/L (ref 135–145)

## 2016-08-23 LAB — CBC
HCT: 35.2 % — ABNORMAL LOW (ref 36.0–46.0)
Hemoglobin: 11.1 g/dL — ABNORMAL LOW (ref 12.0–15.0)
MCH: 27.6 pg (ref 26.0–34.0)
MCHC: 31.5 g/dL (ref 30.0–36.0)
MCV: 87.6 fL (ref 78.0–100.0)
Platelets: 198 10*3/uL (ref 150–400)
RBC: 4.02 MIL/uL (ref 3.87–5.11)
RDW: 13.3 % (ref 11.5–15.5)
WBC: 9.2 10*3/uL (ref 4.0–10.5)

## 2016-08-23 LAB — GLUCOSE, CAPILLARY
Glucose-Capillary: 160 mg/dL — ABNORMAL HIGH (ref 65–99)
Glucose-Capillary: 153 mg/dL — ABNORMAL HIGH (ref 65–99)

## 2016-08-23 MED ORDER — WHITE PETROLATUM GEL
Status: AC
  Filled 2016-08-23: qty 1

## 2016-08-23 NOTE — Progress Notes (Signed)
Physical Therapy Treatment Patient Details Name: [name] MRN: [id number] DOB: March 12, 1961 Today's Date: 08/23/2016    History of Present Illness Pt is a 56 yo female with Left knee pain secondary to end stage L knee OA, s/p L TKA 45/1/18. PMH significant for HTN, DM, CHF, DoE, CA and hx of PE.     PT Comments    Pt limited by pain and c/o L LE feeling heavy.  MD has ordered doppler, but did receive Xarelto earlier this AM, and therefore clear to see from department protocol standpoint.  Pt moves very slowly with all transitions and shuffeled gait pattern.  Con't to recommend SNF.   Follow Up Recommendations  SNF;Supervision/Assistance - 24 hour     Equipment Recommendations  None recommended by PT    Recommendations for Other Services       Precautions / Restrictions Precautions Precautions: Knee Required Braces or Orthoses: Knee Immobilizer - Left Knee Immobilizer - Left: On when out of bed or walking Restrictions Weight Bearing Restrictions: Yes LLE Weight Bearing: Weight bearing as tolerated    Mobility  Bed Mobility Overal bed mobility: Needs Assistance Bed Mobility: Sit to Supine       Sit to supine: Mod assist   General bed mobility comments: MOD A for B LE to get up onto bed with pt hollering out.  Transfers Overall transfer level: Needs assistance Equipment used: Rolling walker (2 wheeled) Transfers: Sit to/from [facility] Sit to Stand: Min assist Stand pivot transfers: Min assist       General transfer comment: MIN A to stand from recliner and BSC and recliner > BSC transfer. VERY slow transition with movements.  A for L LE positioning with transfer.  Unable to get KI on due to positioning of her in chair and unable to get it high enough with her pain level and position  Ambulation/Gait Ambulation/Gait assistance: Min assist Ambulation Distance (Feet): 5 Feet Assistive device: Rolling walker (2 wheeled) Gait  Pattern/deviations: Step-to pattern;Decreased step length - right;Decreased step length - left;Antalgic;Trunk flexed;Shuffle Gait velocity: VERY SLOW Gait velocity interpretation: Below normal speed for age/gender General Gait Details: Pt with extremely slow gait from BSC > bed with shuffeled gait pattern. o2 87% on RA and donned o2 and increased to 95%.   Stairs            Wheelchair Mobility    Modified Rankin (Stroke Patients Only)       Balance Overall balance assessment: Needs assistance Sitting-balance support: No upper extremity supported Sitting balance-Leahy Scale: Fair     Standing balance support: Bilateral upper extremity supported Standing balance-Leahy Scale: Poor Standing balance comment: Pt requires UE for support                            Cognition Arousal/Alertness: Awake/alert Behavior During Therapy: WFL for tasks assessed/performed Overall Cognitive Status: Within Functional Limits for tasks assessed                                 General Comments: Pt became very fatigued at end of session      Exercises Total Joint Exercises Ankle Circles/Pumps: AROM;Both;20 reps    General Comments        Pertinent Vitals/Pain Pain Assessment: 0-10 Pain Score: 9  Pain Location: L knee Pain Descriptors / Indicators: Operative site guarding;Heaviness;Tightness Pain Intervention(s): Limited activity within patient's  tolerance;Monitored during session;Repositioned;Ice applied    Home Living                      Prior Function            PT Goals (current goals can now be found in the care plan section) Acute Rehab PT Goals PT Goal Formulation: With patient Time For Goal Achievement: 08/29/16 Potential to Achieve Goals: Good Progress towards PT goals: Progressing toward goals    Frequency    7X/week      PT Plan Current plan remains appropriate    Co-evaluation              AM-PAC PT "6 Clicks"  Daily Activity  Outcome Measure  Difficulty turning over in bed (including adjusting bedclothes, sheets and blankets)?: Total Difficulty moving from lying on back to sitting on the side of the bed? : Total Difficulty sitting down on and standing up from a chair with arms (e.g., wheelchair, bedside commode, etc,.)?: Total Help needed moving to and from a bed to chair (including a wheelchair)?: A Little Help needed walking in hospital room?: A Little Help needed climbing 3-5 steps with a railing? : A Lot 6 Click Score: 11    End of Session Equipment Utilized During Treatment: Gait belt Activity Tolerance: Patient limited by pain Patient left: in bed;with call bell/phone within reach Nurse Communication: Mobility status PT Visit Diagnosis: Unsteadiness on feet (R26.81);Other abnormalities of gait and mobility (R26.89);Muscle weakness (generalized) (M62.81);Pain Pain - Right/Left: Left Pain - part of body: Knee     Time: 2979-8921 PT Time Calculation (min) (ACUTE ONLY): 50 min  Charges:  $Gait Training: 8-22 mins $Therapeutic Activity: 23-37 mins                    G Codes:       Idonna Heeren L. Tamala Julian, [place] Pager [phone] 08/23/2016    [place] 08/23/2016, 1:08 PM

## 2016-08-23 NOTE — Progress Notes (Signed)
Placed patient on CPAP for the night via auto-mode with minimum pressure set at 4cm and maximum pressure set at 20cm. Oxyegen set at 2lpm

## 2016-08-23 NOTE — Evaluation (Signed)
Occupational Therapy Evaluation Patient Details Name: [name] MRN: [id number] DOB: 09/07/1960 Today's Date: 08/23/2016    History of Present Illness Pt is a 56 yo female with Left knee pain secondary to end stage L knee OA, s/p L TKA 45/1/18. PMH significant for HTN, DM, CHF, DoE, CA and hx of PE.    Clinical Impression   PTA, pt was independent with ADL and IADL and did utilize motorized scooter in grocery store. She currently requires max assist for LB ADL and min assist for toilet transfers. She was limited this session by lethargy and post-operative pain. Pt demonstrates significant functional decline from PLOF and is a good candidate for short-term SNF placement for continued rehabilitation. OT will continue to follow while admitted to improve independence with ADL and functional mobility.    Follow Up Recommendations  SNF;Supervision/Assistance - 24 hour    Equipment Recommendations  Other (comment) (TBD at next venue of care)    Recommendations for Other Services       Precautions / Restrictions Precautions Precautions: Knee Precaution Comments: Educated pt concerning knee precautions during ADL. Required Braces or Orthoses: Knee Immobilizer - Left Knee Immobilizer - Left: On when out of bed or walking Restrictions Weight Bearing Restrictions: Yes LLE Weight Bearing: Weight bearing as tolerated      Mobility Bed Mobility Overal bed mobility: Needs Assistance Bed Mobility: Sit to Supine     Supine to sit: Mod assist     General bed mobility comments: Mod assist to manage B LE out of bed.  Transfers Overall transfer level: Needs assistance Equipment used: Rolling walker (2 wheeled) Transfers: Sit to/from [facility] Sit to Stand: Min assist Stand pivot transfers: Min assist       General transfer comment: Min assist for lifting and steadying.    Balance Overall balance assessment: Needs assistance Sitting-balance support: No  upper extremity supported Sitting balance-Leahy Scale: Fair     Standing balance support: Bilateral upper extremity supported;During functional activity Standing balance-Leahy Scale: Poor Standing balance comment: Reliant on support from RW                           ADL either performed or assessed with clinical judgement   ADL Overall ADL's : Needs assistance/impaired Eating/Feeding: Set up;Sitting   Grooming: Set up;Sitting   Upper Body Bathing: Supervision/ safety;Sitting Upper Body Bathing Details (indicate cue type and reason): at EOB Lower Body Bathing: Maximal assistance;Sit to/from stand   Upper Body Dressing : Supervision/safety;Sitting Upper Body Dressing Details (indicate cue type and reason): at EOB Lower Body Dressing: Maximal assistance;Sit to/from stand   Toilet Transfer: Minimal assistance;Ambulation;BSC;RW Toilet Transfer Details (indicate cue type and reason): Taking 5 steps Toileting- Clothing Manipulation and Hygiene: Minimal assistance;Sit to/from stand       Functional mobility during ADLs: Minimal assistance;Rolling walker General ADL Comments: Pt limited by pain and moving slowly this session.     Vision Patient Visual Report: No change from baseline Vision Assessment?: No apparent visual deficits     Perception     Praxis      Pertinent Vitals/Pain Pain Assessment: Faces Faces Pain Scale: Hurts even more Pain Location: L knee Pain Descriptors / Indicators: Operative site guarding;Heaviness;Tightness Pain Intervention(s): Limited activity within patient's tolerance;Monitored during session;Repositioned;Ice applied     Hand Dominance Right   Extremity/Trunk Assessment Upper Extremity Assessment Upper Extremity Assessment: Overall WFL for tasks assessed   Lower Extremity Assessment Lower Extremity Assessment:  LLE deficits/detail LLE Deficits / Details: Decreased strength and ROM as expected post-operatively. LLE: Unable to  fully assess due to pain;Unable to fully assess due to immobilization       Communication Communication Communication: No difficulties   Cognition Arousal/Alertness: Awake/alert Behavior During Therapy: WFL for tasks assessed/performed Overall Cognitive Status: Within Functional Limits for tasks assessed                                 General Comments: Pt became very fatigued at end of session   General Comments       Exercises     Shoulder Instructions      Home Living Family/patient expects to be discharged to:: Private residence Living Arrangements: Alone Available Help at Discharge: Available PRN/intermittently;Family Type of Home: Apartment Home Access: Level entry     Home Layout: One level     Bathroom Shower/Tub: Tub/shower unit;Curtain   Biochemist, clinical: Handicapped height Bathroom Accessibility: Yes   Home Equipment: Transport planner          Prior Functioning/Environment Level of Independence: Independent        Comments: requires motorized scooter in grocery scooter, drives, independent with ADLs, iADLs        OT Problem List: Decreased strength;Decreased range of motion;Decreased activity tolerance;Impaired balance (sitting and/or standing);Decreased safety awareness;Decreased knowledge of use of DME or AE;Decreased knowledge of precautions;Pain      OT Treatment/Interventions: Self-care/ADL training;Therapeutic exercise;Energy conservation;DME and/or AE instruction;Therapeutic activities;Patient/family education;Balance training    OT Goals(Current goals can be found in the care plan section) Acute Rehab OT Goals Patient Stated Goal: go home soon OT Goal Formulation: With patient Time For Goal Achievement: 09/06/16 Potential to Achieve Goals: Good ADL Goals Pt Will Perform Grooming: with supervision;standing Pt Will Perform Lower Body Bathing: with min guard assist;sit to/from stand Pt Will Perform Lower Body Dressing: with  min guard assist;sit to/from stand Pt Will Transfer to Toilet: with min guard assist;ambulating;bedside commode (BSC over toilet) Pt Will Perform Toileting - Clothing Manipulation and hygiene: with min guard assist;sit to/from stand  OT Frequency: Min 1X/week   Barriers to D/C:            Co-evaluation              AM-PAC PT "6 Clicks" Daily Activity     Outcome Measure Help from another person eating meals?: None Help from another person taking care of personal grooming?: None Help from another person toileting, which includes using toliet, bedpan, or urinal?: A Little Help from another person bathing (including washing, rinsing, drying)?: A Lot Help from another person to put on and taking off regular upper body clothing?: A Little Help from another person to put on and taking off regular lower body clothing?: A Lot 6 Click Score: 18   End of Session Equipment Utilized During Treatment: Rolling walker  Activity Tolerance: Patient tolerated treatment well Patient left: in chair;with call bell/phone within reach  OT Visit Diagnosis: Other abnormalities of gait and mobility (R26.89);Pain Pain - Right/Left: Left Pain - part of body: Knee                Time: 9485-4627 OT Time Calculation (min): 20 min Charges:  OT General Charges $OT Visit: 1 Procedure OT Evaluation $OT Eval Moderate Complexity: 1 Procedure G-Codes:     Norman Herrlich, MS OTR/L  Pager: [place] A Matalie Romberger 08/23/2016, 5:40 PM

## 2016-08-23 NOTE — Progress Notes (Signed)
Subjective: Pt stable - pain ok   Objective: Vital signs in last 24 hours: Temp:  [97.3 F (36.3 C)-100 F (37.8 C)] 100 F (37.8 C) (05/02 0500) Pulse Rate:  [73-109] 109 (05/02 0500) Resp:  [12-18] 18 (05/02 0500) BP: (99-151)/(53-96) 151/87 (05/02 0500) SpO2:  [96 %-100 %] 96 % (05/02 0500)  Intake/Output from previous day: 05/01 0701 - 05/02 0700 In: 1360 [P.O.:360; I.V.:1000] Out: 575 [Urine:500; Blood:75] Intake/Output this shift: No intake/output data recorded.  Exam:  Dorsiflexion/Plantar flexion intact  Labs:  Recent Labs  08/23/16 0546  HGB 11.1*    Recent Labs  08/23/16 0546  WBC 9.2  RBC 4.02  HCT 35.2*  PLT 198    Recent Labs  08/23/16 0546  NA 138  K 3.6  CL 104  CO2 28  BUN 6  CREATININE 0.76  GLUCOSE 148*  CALCIUM 8.4*   No results for input(s): LABPT, INR in the last 72 hours.  Assessment/Plan: Plan pt and cpm today - remove ace wrap today - [name] today to r/o dvt   G Alphonzo Severance 08/23/2016, 8:07 AM

## 2016-08-23 NOTE — Progress Notes (Signed)
Orthopedic Tech Progress Note Patient Details:  [name] [birth year] [id number]  Ortho Devices Ortho Device/Splint Location: on cpm at 1755   Braulio Bosch 08/23/2016, 5:55 PM

## 2016-08-23 NOTE — Anesthesia Postprocedure Evaluation (Addendum)
Anesthesia Post Note  Patient: [name]  Procedure(s) Performed: Procedure(s) (LRB): TOTAL KNEE ARTHROPLASTY (Left)  Patient location during evaluation: PACU Anesthesia Type: MAC Level of consciousness: awake and alert Pain management: pain level controlled Vital Signs Assessment: post-procedure vital signs reviewed and stable Respiratory status: spontaneous breathing, nonlabored ventilation, respiratory function stable and patient connected to nasal cannula oxygen Cardiovascular status: stable and blood pressure returned to baseline Postop Assessment: no signs of nausea or vomiting and spinal receding Anesthetic complications: no       Last Vitals:  Vitals:   08/23/16 0500 08/23/16 1228  BP: (!) 151/87 (!) 120/51  Pulse: (!) 109 (!) 107  Resp: 18 18  Temp: 37.8 C 37.2 C    Last Pain:  Vitals:   08/23/16 1228  TempSrc: Oral  PainSc:                  Adamae Ricklefs

## 2016-08-24 ENCOUNTER — Inpatient Hospital Stay (HOSPITAL_COMMUNITY): Payer: Medicare Other

## 2016-08-24 DIAGNOSIS — M7989 Other specified soft tissue disorders: Secondary | ICD-10-CM

## 2016-08-24 DIAGNOSIS — M79609 Pain in unspecified limb: Secondary | ICD-10-CM

## 2016-08-24 NOTE — Progress Notes (Signed)
Subjective: Pt stable - ambulating in room - [name] ordered yesterday but not done   Objective: Vital signs in last 24 hours: Temp:  [98.7 F (37.1 C)-99.9 F (37.7 C)] 99.8 F (37.7 C) (05/03 0451) Pulse Rate:  [75-107] 104 (05/03 0451) Resp:  [18] 18 (05/03 0451) BP: (111-165)/(51-64) 111/51 (05/03 0451) SpO2:  [92 %-96 %] 96 % (05/03 0735)  Intake/Output from previous day: 05/02 0701 - 05/03 0700 In: 460 [P.O.:460] Out: -  Intake/Output this shift: No intake/output data recorded.  Exam:  Neurovascular intact Sensation intact distally Intact pulses distally  Labs:  Recent Labs  08/23/16 0546  HGB 11.1*    Recent Labs  08/23/16 0546  WBC 9.2  RBC 4.02  HCT 35.2*  PLT 198    Recent Labs  08/23/16 0546  NA 138  K 3.6  CL 104  CO2 28  BUN 6  CREATININE 0.76  GLUCOSE 148*  CALCIUM 8.4*   No results for input(s): LABPT, INR in the last 72 hours.  Assessment/Plan: Need [name] today to r/o dvt - sw consult for placement tomorrow   G Alphonzo Severance 08/24/2016, 8:01 AM

## 2016-08-24 NOTE — Progress Notes (Signed)
Placed patient on CPAP for the night via auto-mode with oxygen set at 2lpm  

## 2016-08-24 NOTE — Clinical Social Work Note (Signed)
Clinical Social Work Assessment  Patient Details  Name: [name] MRN: [id number] Date of Birth: [birth year]  Date of referral:  08/24/16               Reason for consult:  Facility Placement                Permission sought to share information with:    Permission granted to share information::  Yes, Verbal Permission Granted  Name::        Agency::  SNF  Relationship::     Contact Information:     Housing/Transportation Living arrangements for the past 2 months:  Single Family Home Source of Information:  Patient Patient Interpreter Needed:  None Criminal Activity/Legal Involvement Pertinent to Current Situation/Hospitalization:  No - Comment as needed Significant Relationships:  Adult Children Lives with:  Self Do you feel safe going back to the place where you live?  No Need for family participation in patient care:  No (Coment)  Care giving concerns:  Patient resided alone prior to hospitalization. She indicated that one daughter lives in area and another daughter is out of the state. She is not safe to return home at this time as she does not have the support at home to care for her.  Social Worker assessment / plan:  CSW met with patient this morning to discuss DC plan and options.  CSW explained role of social worker in the DC process. CSW explained SNF placement and options. Patient indicated that she was interested in [facility]. CSW received permission to send offers to [place] and surrounding areas as well. Patient has an ultrasound pending and DC may be pending based on medical workup.  FL2 completed. Offers sent.   Employment status:  Unemployed Nurse, adult PT Recommendations:  [place] / Referral to community resources:  [place]  Patient/Family's Response to care:  Patient is appreciative of CSW support with DC to SNF. Patient reports no issues at this time.  Patient/Family's  Understanding of and Emotional Response to Diagnosis, Current Treatment, and Prognosis:  Patient has good understanding of the diagnosis, current treatment and prognosis and is aware of the medical workup still needed prior to DC.  Patient is hopeful that SNF placement will hep her with mobility and impairment issues.  Emotional Assessment Appearance:  Appears stated age Attitude/Demeanor/Rapport:   (Coperative) Affect (typically observed):  Accepting, Appropriate Orientation:  Oriented to Self, Oriented to Place, Oriented to  Time, Oriented to Situation Alcohol / Substance use:  Not Applicable Psych involvement (Current and /or in the community):  No (Comment)  Discharge Needs  Concerns to be addressed:  Care Coordination Readmission within the last 30 days:  No Current discharge risk:  Physical Impairment, Dependent with Mobility Barriers to Discharge:  No Barriers Identified   Normajean Baxter, LCSW 08/24/2016, 10:10 AM

## 2016-08-24 NOTE — Progress Notes (Signed)
Preliminary results by tech - Venous Duplex Lower Ext. Completed. Negative for deep and superficial vein thrombosis in both legs.  Tanyiah Laurich, BS, RDMS, RVT  

## 2016-08-24 NOTE — NC FL2 (Signed)
[place] LEVEL OF CARE SCREENING TOOL     IDENTIFICATION  Patient Name: [name] Birthdate: Sep 02, 1960 Sex: female Admission Date (Current Location): 08/22/2016  [facility] and [place] Number:  Herbalist and Address:  [facility] [place], [place] [address], [place], [place] [address]      Provider Number: [id number]  Attending Physician Name and Address:  Meredith Pel, MD  Relative Name and Phone Number:       Current Level of Care: Hospital Recommended Level of Care: [place] Prior Approval Number:    Date Approved/Denied: 08/24/16 PASRR Number: [id number]  Discharge Plan: SNF    Current Diagnoses: Patient Active Problem List   Diagnosis Date Noted  . Arthritis of knee 08/22/2016  . Cervical radiculopathy 07/26/2016  . Cervical disc disorder with radiculopathy 07/26/2016  . Right arm pain 06/29/2016  . Cervicalgia 06/29/2016  . Primary osteoarthritis of left knee 06/29/2016  . Acute respiratory failure with hypoxia ([place]) 05/28/2016  . CAP (community acquired pneumonia) 05/22/2016  . Community acquired pneumonia 05/21/2016  . Hypersomnia with sleep apnea 11/18/2015  . Lethargy 11/18/2015  . Chest pain 09/30/2014  . Abnormal cardiac function test 09/29/2014  . Nausea with vomiting   . Chest pain with moderate risk for cardiac etiology 09/28/2014  . HTN (hypertension) 09/28/2014  . Hypokalemia   . Paresthesia 06/18/2014  . Numbness and tingling of left arm and leg   . Unstable angina ([place]) 05/24/2014  . OSA on CPAP 05/24/2014  . DM2 (diabetes mellitus, type 2) ([place]) 10/29/2012  . S/P laparoscopic appendectomy 06/04/2012  . Acute appendicitis with generalized peritonitis 06/04/2012  . Diastolic CHF ([place]) 56/38/7564  . Mediastinal mass 01/09/2012  . Hypotension 01/08/2012  . SOB (shortness of breath) 06/20/2011  . Mediastinal abnormality 06/20/2011  . Dyslipidemia 12/23/2009  . GLAUCOMA  12/23/2009  . ARTHRITIS 12/23/2009  . 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 Morbid obesity-BMI 45 09/13/2006  . ANXIETY STATE NOS 09/13/2006  . DISORDER, DEPRESSIVE NEC 09/13/2006  . CARPAL TUNNEL SYNDROME, MILD 09/13/2006  . Unspecified essential hypertension 09/13/2006  . IBS 09/13/2006  . DEGENERATION, LUMBAR/LUMBOSACRAL DISC 09/13/2006  . SYMPTOM, SWELLING/MASS/LUMP IN CHEST 09/13/2006  . PULMONARY EMBOLISM, HX OF 09/13/2006    Orientation RESPIRATION BLADDER Height & Weight     Self, Time, Situation, Place  O2 (Nasal Cannula 2 L) Continent Weight:   Height:     BEHAVIORAL SYMPTOMS/MOOD NEUROLOGICAL BOWEL NUTRITION STATUS      Continent Diet (See DC Summary)  AMBULATORY STATUS COMMUNICATION OF NEEDS Skin   Limited Assist Verbally Surgical wounds (Incision left total knee incision, compression wrap)                       Personal Care Assistance Level of Assistance  Bathing, Feeding, Dressing Bathing Assistance: Limited assistance Feeding assistance: Independent Dressing Assistance: Limited assistance     Functional Limitations Info  Sight, Hearing, Speech Sight Info: Adequate Hearing Info: Adequate Speech Info: Adequate    SPECIAL CARE FACTORS FREQUENCY  PT (By licensed PT), OT (By licensed OT)     PT Frequency: 5xweek OT Frequency: 5xweek            Contractures      Additional Factors Info  Code Status, Allergies, Psychotropic Code Status Info: Full Allergies Info: PENICILLINS, LATEX, SULFA ANTIBIOTICS  Psychotropic Info: Zoloft         Current Medications (08/24/2016):  This is the current hospital  active medication list Current Facility-Administered Medications  Medication Dose Route Frequency Provider Last Rate Last Dose  . acetaminophen (TYLENOL) tablet 650 mg  650 mg Oral Q6H PRN Meredith Pel, MD   650 mg at 08/24/16 0534   Or  . acetaminophen (TYLENOL) suppository 650 mg  650 mg Rectal Q6H PRN Meredith Pel, MD      . albuterol  (PROVENTIL) (2.5 MG/3ML) 0.083% nebulizer solution 3 mL  3 mL Inhalation Q4H PRN Meredith Pel, MD      . atorvastatin (LIPITOR) tablet 40 mg  40 mg Oral QHS Meredith Pel, MD   40 mg at 08/23/16 2056  . benzonatate (TESSALON) capsule 200 mg  200 mg Oral TID PRN Meredith Pel, MD      . carvedilol (COREG CR) 24 hr capsule 10 mg  10 mg Oral Daily Meredith Pel, MD   10 mg at 08/24/16 0910  . cyclobenzaprine (FLEXERIL) tablet 5 mg  5 mg Oral BID Meredith Pel, MD   5 mg at 08/24/16 0910  . diphenhydrAMINE (BENADRYL) capsule 25 mg  25 mg Oral Q6H PRN Meredith Pel, MD      . furosemide (LASIX) tablet 40 mg  40 mg Oral Daily Meredith Pel, MD   40 mg at 08/24/16 0911  . gabapentin (NEURONTIN) capsule 600 mg  600 mg Oral TID Meredith Pel, MD   600 mg at 08/24/16 0910  . ipratropium-albuterol (DUONEB) 0.5-2.5 (3) MG/3ML nebulizer solution 3 mL  3 mL Nebulization Q4H PRN Meredith Pel, MD      . isosorbide dinitrate (ISORDIL) tablet 20 mg  20 mg Oral BID Meredith Pel, MD   20 mg at 08/24/16 0911  . linagliptin (TRADJENTA) tablet 5 mg  5 mg Oral Daily Meredith Pel, MD   5 mg at 08/24/16 0910   And  . metFORMIN (GLUCOPHAGE-XR) 24 hr tablet 500 mg  500 mg Oral Q breakfast Meredith Pel, MD   500 mg at 08/24/16 0910  . lisinopril (PRINIVIL,ZESTRIL) tablet 20 mg  20 mg Oral Daily Meredith Pel, MD   20 mg at 08/24/16 0911  . menthol-cetylpyridinium (CEPACOL) lozenge 3 mg  1 lozenge Oral PRN Meredith Pel, MD       Or  . phenol (CHLORASEPTIC) mouth spray 1 spray  1 spray Mouth/Throat PRN Meredith Pel, MD      . metoCLOPramide (REGLAN) tablet 5-10 mg  5-10 mg Oral Q8H PRN Meredith Pel, MD       Or  . metoCLOPramide (REGLAN) injection 5-10 mg  5-10 mg Intravenous Q8H PRN Meredith Pel, MD      . mometasone-formoterol [facility]) 200-5 MCG/ACT inhaler 2 puff  2 puff Inhalation BID Meredith Pel, MD   2 puff at 08/24/16  0734  . morphine 4 MG/ML injection 4 mg  4 mg Intravenous Q4H PRN Meredith Pel, MD   4 mg at 08/24/16 1013  . nitroGLYCERIN (NITROSTAT) SL tablet 0.4 mg  0.4 mg Sublingual Q5 min PRN Meredith Pel, MD      . ondansetron [facility]) tablet 4 mg  4 mg Oral Q6H PRN Meredith Pel, MD       Or  . ondansetron [facility]) injection 4 mg  4 mg Intravenous Q6H PRN Meredith Pel, MD      . oxyCODONE (Oxy IR/ROXICODONE) immediate release tablet 5-10 mg  5-10 mg Oral Q3H PRN [place]  Marlou Sa, MD   5 mg at 08/23/16 2137  . pantoprazole (PROTONIX) EC tablet 40 mg  40 mg Oral Daily Meredith Pel, MD   40 mg at 08/24/16 0911  . potassium chloride (K-DUR,KLOR-CON) CR tablet 10 mEq  10 mEq Oral BID Meredith Pel, MD   10 mEq at 08/24/16 0911  . rivaroxaban (XARELTO) tablet 10 mg  10 mg Oral Q breakfast Meredith Pel, MD   10 mg at 08/24/16 0911  . sertraline (ZOLOFT) tablet 100 mg  100 mg Oral Daily Meredith Pel, MD   100 mg at 08/24/16 0911     Discharge Medications: Please see discharge summary for a list of discharge medications.  Relevant Imaging Results:  Relevant Lab Results:   Additional Information [id number]  Normajean Baxter, LCSW

## 2016-08-24 NOTE — Progress Notes (Signed)
Physical Therapy Treatment Patient Details Name: [name] MRN: [id number] DOB: 06-Aug-1960 Today's Date: 08/24/2016    History of Present Illness Pt is a 56 yo female with Left knee pain secondary to end stage L knee OA, s/p L TKA 45/1/18. PMH significant for HTN, DM, CHF, DoE, CA and hx of PE.     PT Comments    Pt moving much better than yesterday, but still slow with ambulation.  She was able to ambulate 8' with RW and min to min/guard A.  Con't to recommend SNF.   Follow Up Recommendations  SNF;Supervision/Assistance - 24 hour     Equipment Recommendations  None recommended by PT    Recommendations for Other Services       Precautions / Restrictions Precautions Precautions: Knee Precaution Comments: Educated on use of KI Required Braces or Orthoses: Knee Immobilizer - Left Knee Immobilizer - Left: On when out of bed or walking Restrictions Weight Bearing Restrictions: Yes LLE Weight Bearing: Weight bearing as tolerated    Mobility  Bed Mobility Overal bed mobility: Needs Assistance Bed Mobility: Supine to Sit     Supine to sit: Min assist     General bed mobility comments: MIN A for L LE and use of bed pad to A getting hips turned.  Cues for use of UE  Transfers Overall transfer level: Needs assistance Equipment used: Rolling walker (2 wheeled) Transfers: Sit to/from Stand Sit to Stand: Min assist         General transfer comment: MIN A with slow transition and cueing for hand placement  Ambulation/Gait Ambulation/Gait assistance: Min assist;Min guard Ambulation Distance (Feet): 35 Feet Assistive device: Rolling walker (2 wheeled) Gait Pattern/deviations: Step-to pattern;Decreased step length - right;Decreased step length - left;Antalgic;Shuffle Gait velocity: decreased Gait velocity interpretation: Below normal speed for age/gender General Gait Details: Pt with shuffled gait pattern at times.  She required many standing rest breaks during  gait due to UE fatigue.  KI intact   Stairs            Wheelchair Mobility    Modified Rankin (Stroke Patients Only)       Balance Overall balance assessment: Needs assistance Sitting-balance support: No upper extremity supported Sitting balance-Leahy Scale: Fair     Standing balance support: Bilateral upper extremity supported Standing balance-Leahy Scale: Poor Standing balance comment: Reliant on support from RW                            Cognition Arousal/Alertness: Awake/alert Behavior During Therapy: WFL for tasks assessed/performed Overall Cognitive Status: Within Functional Limits for tasks assessed                                        Exercises Total Joint Exercises Ankle Circles/Pumps: AROM;Both;20 reps Quad Sets: AROM;Left;10 reps;Supine Heel Slides: AROM;AAROM;Left;10 reps;Supine Hip ABduction/ADduction: AROM;AAROM;Left;10 reps;Supine    General Comments General comments (skin integrity, edema, etc.): KI donned in supine after therex      Pertinent Vitals/Pain Pain Assessment: 0-10 Pain Score: 8  Pain Location: L knee Pain Descriptors / Indicators: Grimacing;Guarding;Operative site guarding Pain Intervention(s): Limited activity within patient's tolerance;Monitored during session;Premedicated before session;Repositioned;Ice applied    Home Living                      Prior Function  PT Goals (current goals can now be found in the care plan section) Acute Rehab PT Goals Patient Stated Goal: rehab PT Goal Formulation: With patient Time For Goal Achievement: 08/29/16 Potential to Achieve Goals: Good Progress towards PT goals: Progressing toward goals    Frequency    7X/week      PT Plan Current plan remains appropriate    Co-evaluation              AM-PAC PT "6 Clicks" Daily Activity  Outcome Measure  Difficulty turning over in bed (including adjusting bedclothes, sheets and  blankets)?: Total Difficulty moving from lying on back to sitting on the side of the bed? : Total Difficulty sitting down on and standing up from a chair with arms (e.g., wheelchair, bedside commode, etc,.)?: Total Help needed moving to and from a bed to chair (including a wheelchair)?: A Little Help needed walking in hospital room?: A Little Help needed climbing 3-5 steps with a railing? : A Lot 6 Click Score: 11    End of Session Equipment Utilized During Treatment: Gait belt Activity Tolerance: Patient tolerated treatment well;Patient limited by pain Patient left: in chair;with call bell/phone within reach   PT Visit Diagnosis: Unsteadiness on feet (R26.81);Other abnormalities of gait and mobility (R26.89);Muscle weakness (generalized) (M62.81);Pain Pain - Right/Left: Left Pain - part of body: Knee     Time: 3976-7341 PT Time Calculation (min) (ACUTE ONLY): 39 min  Charges:  $Gait Training: 8-22 mins $Therapeutic Exercise: 8-22 mins $Therapeutic Activity: 8-22 mins                    G Codes:       [name], [place] Pager [phone] 08/24/2016    [name] 08/24/2016, 1:11 PM

## 2016-08-25 MED ORDER — RIVAROXABAN 10 MG PO TABS: 10.0000 mg | ORAL_TABLET | Freq: Every day | ORAL | 0 refills | Status: DC

## 2016-08-25 MED ORDER — OXYCODONE HCL 5 MG PO TABS: 5.0000 mg | ORAL_TABLET | ORAL | 0 refills | Status: DC | PRN

## 2016-08-25 NOTE — Progress Notes (Signed)
Subjective: Pt stable - pain ok - [name] neg for dvt   Objective: Vital signs in last 24 hours: Temp:  [99 F (37.2 C)-99.3 F (37.4 C)] 99 F (37.2 C) (05/04 0443) Pulse Rate:  [105-118] 107 (05/04 0443) Resp:  [16-18] 16 (05/04 0443) BP: (99-127)/(45-70) 127/59 (05/04 0443) SpO2:  [90 %-96 %] 90 % (05/04 0443)  Intake/Output from previous day: 05/03 0701 - 05/04 0700 In: 840 [P.O.:840] Out: -  Intake/Output this shift: No intake/output data recorded.  Exam:  Intact pulses distally  Labs:  Recent Labs  08/23/16 0546  HGB 11.1*    Recent Labs  08/23/16 0546  WBC 9.2  RBC 4.02  HCT 35.2*  PLT 198    Recent Labs  08/23/16 0546  NA 138  K 3.6  CL 104  CO2 28  BUN 6  CREATININE 0.76  GLUCOSE 148*  CALCIUM 8.4*   No results for input(s): LABPT, INR in the last 72 hours.  Assessment/Plan: Plan dc to snf today   G Alphonzo Severance 08/25/2016, 8:08 AM

## 2016-08-25 NOTE — Plan of Care (Signed)
Problem: Pain Management: Goal: Pain level will decrease with appropriate interventions Outcome: Progressing Pain being managed with oral pain medication. Vital signs are stable. Patient does not appear to be in distress. Resting well.

## 2016-08-25 NOTE — Care Management Important Message (Signed)
Important Message  Patient Details  Name: [name] MRN: [id number] Date of Birth: [birth year]   Medicare Important Message Given:  Yes    Brooklen Runquist Montine Circle 08/25/2016, 11:33 AM

## 2016-08-25 NOTE — Clinical Social Work Placement (Signed)
   CLINICAL SOCIAL WORK PLACEMENT  NOTE  Date:  08/25/2016  Patient Details  Name: [name] MRN: [id number] Date of Birth: 07/23/1960  Clinical Social Work is seeking post-discharge placement for this patient at the [place] level of care (*CSW will initial, date and re-position this form in  chart as items are completed):  Yes   Patient/family provided with [place] Work Department's list of facilities offering this level of care within the geographic area requested by the patient (or if unable, by the patient's family).  Yes   Patient/family informed of their freedom to choose among providers that offer the needed level of care, that participate in Medicare, Medicaid or managed care program needed by the patient, have an available bed and are willing to accept the patient.  Yes   Patient/family informed of [place]'s ownership interest in [facility] and [facility], as well as of the fact that they are under no obligation to receive care at these facilities.  PASRR submitted to EDS on 08/24/16     PASRR number received on 08/24/16     Existing PASRR number confirmed on       FL2 transmitted to all facilities in geographic area requested by pt/family on 08/24/16     FL2 transmitted to all facilities within larger geographic area on       Patient informed that his/her managed care company has contracts with or will negotiate with certain facilities, including the following:        Yes   Patient/family informed of bed offers received.  Patient chooses bed at [facility]     Physician recommends and patient chooses bed at      Patient to be transferred to [facility] on 08/25/16.  Patient to be transferred to facility by Ambulance     Patient family notified on 08/25/16 of transfer.  Name of family member notified:  Patient to notify family     PHYSICIAN       Additional Comment:    Barbette Or,  [place]

## 2016-08-25 NOTE — Clinical Social Work Note (Signed)
Clinical Social Worker facilitated patient discharge including contacting patient family and facility to confirm patient discharge plans.  Clinical information faxed to facility and family agreeable with plan.  CSW arranged ambulance transport via [facility] to [facility].  RN to call report prior to discharge.  Clinical Social Worker will sign off for now as social work intervention is no longer needed. Please consult us again if new need arises.  Jesse Angelli Baruch, LCSW [phone] 

## 2016-08-25 NOTE — Progress Notes (Signed)
Physical Therapy Treatment Patient Details Name: [name] MRN: [id number] DOB: [birth year] Today's Date: 08/25/2016    History of Present Illness Pt is a 56 yo female with Left knee pain secondary to end stage L knee OA, s/p L TKA 45/1/18. PMH significant for HTN, DM, CHF, DoE, CA and hx of PE.     PT Comments    Pt is making good progress towards her goals. Pt is minA for bed mobility, transfers with RW and ambulation of 50 feet with RW. Pt with minor skin irritation with KI and educated that [place] only needs to be donned for walking. Pt requires skilled PT to progress ambulation and to improve LE strength and ROM and endurance to safely mobilize in her discharge environment.   Follow Up Recommendations  SNF;Supervision/Assistance - 24 hour     Equipment Recommendations  None recommended by PT       Precautions / Restrictions Precautions Precautions: Knee Precaution Comments: Educated on use of KI Required Braces or Orthoses: Knee Immobilizer - Left Knee Immobilizer - Left: On when out of bed or walking Restrictions Weight Bearing Restrictions: Yes LLE Weight Bearing: Weight bearing as tolerated    Mobility  Bed Mobility Overal bed mobility: Needs Assistance Bed Mobility: Supine to Sit     Supine to sit: Min assist     General bed mobility comments: minA for LE management to floor and bed pad scoot of hips to EoB  Transfers Overall transfer level: Needs assistance Equipment used: Rolling walker (2 wheeled) Transfers: Sit to/from Stand Sit to Stand: Min assist         General transfer comment: minA for power up, very slow in transitioning, vc for not stoppin in middle of stand pivot trasfer to [facility]  Ambulation/Gait Ambulation/Gait assistance: Min assist;Min guard Ambulation Distance (Feet): 50 Feet Assistive device: Rolling walker (2 wheeled) Gait Pattern/deviations: Step-to pattern;Decreased step length - right;Decreased step length -  left;Antalgic;Shuffle;Trunk flexed Gait velocity: decreased Gait velocity interpretation: Below normal speed for age/gender General Gait Details: Shuffling gait pattern, trouble advancing L LE due to KI, c/o of pain in UE vc to increase dorsiflexion to help in L LE advancement       Balance Overall balance assessment: Needs assistance Sitting-balance support: No upper extremity supported Sitting balance-Leahy Scale: Good Sitting balance - Comments: able to sit EoB and reach behind her for phone   Standing balance support: Bilateral upper extremity supported Standing balance-Leahy Scale: Fair Standing balance comment: able to perform pericare with single UE support                            Cognition Arousal/Alertness: Awake/alert Behavior During Therapy: WFL for tasks assessed/performed Overall Cognitive Status: Within Functional Limits for tasks assessed                                        Exercises Total Joint Exercises Ankle Circles/Pumps: AROM;Both;20 reps Quad Sets: AROM;Left;10 reps;Supine Heel Slides: AROM;AAROM;Left;10 reps;Supine    General Comments General comments (skin integrity, edema, etc.): Pt with KI on in bed, pt with c/o of itching, KI removed and skin reddened in area of irritation, Nursing notified and benadryl given. Pt left in recliner with KI off for air circulation and decreased skin irritation.       Pertinent Vitals/Pain Pain Assessment: 0-10 Pain Score: 8  Pain  Location: L knee Pain Descriptors / Indicators: Grimacing;Guarding;Operative site guarding Pain Intervention(s): Premedicated before session;Monitored during session;Repositioned  VSS           PT Goals (current goals can now be found in the care plan section) Acute Rehab PT Goals Patient Stated Goal: rehab PT Goal Formulation: With patient Time For Goal Achievement: 08/29/16 Potential to Achieve Goals: Good    Frequency    7X/week      PT  Plan Current plan remains appropriate       AM-PAC PT "6 Clicks" Daily Activity  Outcome Measure  Difficulty turning over in bed (including adjusting bedclothes, sheets and blankets)?: Total Difficulty moving from lying on back to sitting on the side of the bed? : Total Difficulty sitting down on and standing up from a chair with arms (e.g., wheelchair, bedside commode, etc,.)?: Total Help needed moving to and from a bed to chair (including a wheelchair)?: A Little Help needed walking in hospital room?: A Little Help needed climbing 3-5 steps with a railing? : A Lot 6 Click Score: 11    End of Session Equipment Utilized During Treatment: Gait belt Activity Tolerance: Patient tolerated treatment well;Patient limited by pain Patient left: in chair;with call bell/phone within reach   PT Visit Diagnosis: Unsteadiness on feet (R26.81);Other abnormalities of gait and mobility (R26.89);Muscle weakness (generalized) (M62.81);Pain Pain - Right/Left: Left Pain - part of body: Knee     Time: 0347-4259 PT Time Calculation (min) (ACUTE ONLY): 57 min  Charges:  $Gait Training: 23-37 mins $Therapeutic Exercise: 8-22 mins $Therapeutic Activity: 8-22 mins                    G Codes:       [name] B. Migdalia Dk PT, DPT Acute Rehabilitation  [phone] Pager [phone]   [place] 08/25/2016, 12:08 PM

## 2016-08-25 NOTE — Plan of Care (Signed)
Problem: Safety: Goal: Ability to remain free from injury will improve Outcome: Progressing Patient has remained free of falls during this admission. Call bell within reach. Patient is alert and oriented. Clean and clear environment maintained. Toileting offered every 2 hours. Patient uses walker with OOB. Bed alarm is on. 3/4 siderails in place. Nonskid footwear being utilized. Patient verbalized understanding of safety instruction. Bed in low and locked position.

## 2016-08-25 NOTE — Discharge Summary (Signed)
Physician Discharge Summary  Patient ID: [name] MRN: [id number] DOB/AGE: 57-22-1962 57 y.o.  Admit date: 08/22/2016 Discharge date: 08/25/2016  Admission Diagnoses:  Active Problems:   Arthritis of knee   Discharge Diagnoses:  Same  Surgeries: Procedure(s): TOTAL KNEE ARTHROPLASTY on 08/22/2016   Consultants:   Discharged Condition: Stable  Hospital Course: [name] is an 56 y.o. female who was admitted 08/22/2016 with a chief complaint of knee pain, and found to have a diagnosis of knee arthritis.  They were brought to the operating room on 08/22/2016 and underwent the above named procedures.  Did well with PT and CPM. U/S negative for dvt prior to dc. On xarelto for dvt prevention for 2 more weeks. To snf in good condition  Antibiotics given:  Anti-infectives    Start     Dose/Rate Route Frequency Ordered Stop   08/22/16 1800  vancomycin (VANCOCIN) IVPB 1000 mg/200 mL premix     1,000 mg 200 mL/hr over 60 Minutes Intravenous Every 12 hours 08/22/16 1231 08/22/16 1900   08/22/16 0640  clindamycin (CLEOCIN) IVPB 900 mg     900 mg 100 mL/hr over 30 Minutes Intravenous On call to O.R. 08/22/16 6073 08/22/16 0815    .  Recent vital signs:  Vitals:   08/24/16 2319 08/25/16 0443  BP:  (!) 127/59  Pulse: (!) 105 (!) 107  Resp: 18 16  Temp:  99 F (37.2 C)    Recent laboratory studies:  Results for orders placed or performed during the hospital encounter of 08/22/16  Glucose, capillary  Result Value Ref Range   Glucose-Capillary 161 (H) 65 - 99 mg/dL  Glucose, capillary  Result Value Ref Range   Glucose-Capillary 128 (H) 65 - 99 mg/dL  Glucose, capillary  Result Value Ref Range   Glucose-Capillary 119 (H) 65 - 99 mg/dL  CBC  Result Value Ref Range   WBC 9.2 4.0 - 10.5 K/uL   RBC 4.02 3.87 - 5.11 MIL/uL   Hemoglobin 11.1 (L) 12.0 - 15.0 g/dL   HCT 35.2 (L) 36.0 - 46.0 %   MCV 87.6 78.0 - 100.0 fL   MCH 27.6 26.0 - 34.0 pg   MCHC 31.5 30.0 - 36.0  g/dL   RDW 13.3 11.5 - 15.5 %   Platelets 198 150 - 400 K/uL  Basic metabolic panel  Result Value Ref Range   Sodium 138 135 - 145 mmol/L   Potassium 3.6 3.5 - 5.1 mmol/L   Chloride 104 101 - 111 mmol/L   CO2 28 22 - 32 mmol/L   Glucose, Bld 148 (H) 65 - 99 mg/dL   BUN 6 6 - 20 mg/dL   Creatinine, Ser 0.76 0.44 - 1.00 mg/dL   Calcium 8.4 (L) 8.9 - 10.3 mg/dL   GFR calc non Af Amer >60 >60 mL/min   GFR calc Af Amer >60 >60 mL/min   Anion gap 6 5 - 15  Glucose, capillary  Result Value Ref Range   Glucose-Capillary 160 (H) 65 - 99 mg/dL  Glucose, capillary  Result Value Ref Range   Glucose-Capillary 153 (H) 65 - 99 mg/dL    Discharge Medications:   Allergies as of 08/25/2016      Reactions   Penicillins Swelling   Has patient had a PCN reaction causing immediate rash, facial/tongue/throat swelling, SOB or lightheadedness with hypotension: YES Has patient had a PCN reaction causing severe rash involving mucus membranes or skin necrosis: NO Has patient had a PCN reaction that  required hospitalization NO Has patient had a PCN reaction occurring within the last 10 years: NO If all of the above answers are "NO", then may proceed with Cephalosporin use.   Latex Rash   Sulfa Antibiotics Diarrhea, Itching, Rash      Medication List    STOP taking these medications   aspirin 325 MG tablet   hydrALAZINE 25 MG tablet Commonly known as:  APRESOLINE   hydrOXYzine 50 MG capsule Commonly known as:  VISTARIL     TAKE these medications   albuterol 90 MCG/ACT inhaler Commonly known as:  PROVENTIL,VENTOLIN Inhale 2 puffs into the lungs every 4 (four) hours as needed for shortness of breath.   atorvastatin 40 MG tablet Commonly known as:  LIPITOR Take 40 mg by mouth at bedtime.   benzonatate 200 MG capsule Commonly known as:  TESSALON Take 1 capsule (200 mg total) by mouth 3 (three) times daily as needed for cough (cough unrelieved by robitussin).   budesonide-formoterol 160-4.5  MCG/ACT inhaler Commonly known as:  SYMBICORT Inhale 2 puffs into the lungs 2 (two) times daily.   carvedilol 10 MG 24 hr capsule Commonly known as:  COREG CR Take 10 mg by mouth daily.   cyclobenzaprine 5 MG tablet Commonly known as:  FLEXERIL Take 5 mg by mouth 2 (two) times daily.   furosemide 40 MG tablet Commonly known as:  LASIX Take 40 mg by mouth daily.   gabapentin 300 MG capsule Commonly known as:  NEURONTIN Take 2 capsules (600 mg total) by mouth 3 (three) times daily.   ipratropium-albuterol 0.5-2.5 (3) MG/3ML Soln Commonly known as:  DUONEB Take 3 mLs by nebulization 3 (three) times daily. Use 3 times daily x 4 days, then every 4-6 hours as needed.   isosorbide dinitrate 20 MG tablet Commonly known as:  ISORDIL Take 20 mg by mouth 2 (two) times daily.   KLOR-CON M10 10 MEQ tablet Generic drug:  potassium chloride Take 10 mEq by mouth 2 (two) times daily.   KOMBIGLYZE XR 5-500 MG Tb24 Generic drug:  Saxagliptin-Metformin Take 1 tablet by mouth every morning.   lisinopril 10 MG tablet Commonly known as:  PRINIVIL,ZESTRIL Take 2 tablets by mouth daily.   loratadine 10 MG tablet Commonly known as:  CLARITIN Take 1 tablet (10 mg total) by mouth daily. What changed:  when to take this  reasons to take this   nitroGLYCERIN 0.4 MG SL tablet Commonly known as:  NITROSTAT Place 0.4 mg under the tongue every 5 (five) minutes as needed. For chest pain.   oxyCODONE 5 MG immediate release tablet Commonly known as:  Oxy IR/ROXICODONE Take 1-2 tablets (5-10 mg total) by mouth every 3 (three) hours as needed for breakthrough pain.   pantoprazole 40 MG tablet Commonly known as:  PROTONIX Take 1 tablet (40 mg total) by mouth daily.   rivaroxaban 10 MG Tabs tablet Commonly known as:  XARELTO Take 1 tablet (10 mg total) by mouth daily with breakfast.   sertraline 100 MG tablet Commonly known as:  ZOLOFT Take 100 mg by mouth daily.       Diagnostic  Studies: Dg Chest 2 View  Result Date: 08/11/2016 CLINICAL DATA:  Preop for left knee replacement EXAM: CHEST  2 VIEW COMPARISON:  05/21/2016 FINDINGS: Cardiomediastinal silhouette is stable. No infiltrate or pleural effusion. No pulmonary edema. Mild degenerative changes mid thoracic spine. Moderate degenerative changes lower thoracic spine. Again noted prominent fat pad in right cardiophrenic angle. IMPRESSION: No active cardiopulmonary  disease. Degenerative changes thoracic spine. Electronically Signed   By: Lahoma Crocker M.D.   On: 08/11/2016 16:05   Xr C-arm No Report  Result Date: 08/02/2016 Please see Notes or Procedures tab for imaging impression.   Disposition: 01-Home or Self Care  Discharge Instructions    Call MD / Call 911    Complete by:  As directed    If you experience chest pain or shortness of breath, CALL 911 and be transported to the hospital emergency room.  If you develope a fever above 101 F, pus (white drainage) or increased drainage or redness at the wound, or calf pain, call your surgeon's office.   Constipation Prevention    Complete by:  As directed    Drink plenty of fluids.  Prune juice may be helpful.  You may use a stool softener, such as Colace (over the counter) 100 mg twice a day.  Use MiraLax (over the counter) for constipation as needed.   Diet - low sodium heart healthy    Complete by:  As directed    Discharge instructions    Complete by:  As directed    WEight bearing as tolerated with walker Ok to shower - dressing waterproof Remove dressing next Friday and replace with ace wrap cpm 5 hours per day to increase flexion   Increase activity slowly as tolerated    Complete by:  As directed       Contact information for after-discharge care    Destination    [facility] .   Specialty:  Skilled Nursing Facility Contact information: [place] [phone]               Signed: Anderson Malta 08/25/2016, 8:13 AM

## 2016-08-29 ENCOUNTER — Encounter: Payer: Self-pay | Admitting: Internal Medicine

## 2016-08-29 ENCOUNTER — Non-Acute Institutional Stay (SKILLED_NURSING_FACILITY): Payer: Medicare Other | Admitting: Internal Medicine

## 2016-08-29 DIAGNOSIS — M5412 Radiculopathy, cervical region: Secondary | ICD-10-CM | POA: Diagnosis not present

## 2016-08-29 DIAGNOSIS — F329 Major depressive disorder, single episode, unspecified: Secondary | ICD-10-CM

## 2016-08-29 DIAGNOSIS — K219 Gastro-esophageal reflux disease without esophagitis: Secondary | ICD-10-CM | POA: Diagnosis not present

## 2016-08-29 DIAGNOSIS — I5032 Chronic diastolic (congestive) heart failure: Secondary | ICD-10-CM

## 2016-08-29 DIAGNOSIS — R2681 Unsteadiness on feet: Secondary | ICD-10-CM | POA: Diagnosis not present

## 2016-08-29 DIAGNOSIS — E669 Obesity, unspecified: Secondary | ICD-10-CM

## 2016-08-29 DIAGNOSIS — M1712 Unilateral primary osteoarthritis, left knee: Secondary | ICD-10-CM

## 2016-08-29 DIAGNOSIS — F32A Depression, unspecified: Secondary | ICD-10-CM

## 2016-08-29 DIAGNOSIS — J4520 Mild intermittent asthma, uncomplicated: Secondary | ICD-10-CM | POA: Diagnosis not present

## 2016-08-29 DIAGNOSIS — E1169 Type 2 diabetes mellitus with other specified complication: Secondary | ICD-10-CM

## 2016-08-29 DIAGNOSIS — I10 Essential (primary) hypertension: Secondary | ICD-10-CM | POA: Diagnosis not present

## 2016-08-29 DIAGNOSIS — D500 Iron deficiency anemia secondary to blood loss (chronic): Secondary | ICD-10-CM | POA: Diagnosis not present

## 2016-08-29 NOTE — Progress Notes (Signed)
LOCATION: [facility]  PCP: Velna Hatchet, MD   Code Status: Full Code  Goals of care: Advanced Directive information Advanced Directives 08/22/2016  Does Patient Have a Medical Advance Directive? No  Would patient like information on creating a medical advance directive? -  Pre-existing out of facility DNR order (yellow form or pink MOST form) -       Extended Emergency Contact Information Primary Emergency Contact: White,Ebony Address: [place], [place] [address] [place] [employer] Phone: [phone] Relation: Daughter Secondary Emergency Contact: Neal,April  [place] Phone: [phone] Relation: Daughter   Allergies  Allergen Reactions  . Penicillins Swelling    Has patient had a PCN reaction causing immediate rash, facial/tongue/throat swelling, SOB or lightheadedness with hypotension: YES Has patient had a PCN reaction causing severe rash involving mucus membranes or skin necrosis: NO Has patient had a PCN reaction that required hospitalization NO Has patient had a PCN reaction occurring within the last 10 years: NO If all of the above answers are "NO", then may proceed with Cephalosporin use.  . Latex Rash  . Sulfa Antibiotics Diarrhea, Itching and Rash    Chief Complaint  Patient presents with  . New Admit To SNF    New Admission Visit      HPI:  Patient is a 56 y.o. female seen today for short term rehabilitation post hospital admission from 08/22/16-08/25/16 with left knee OA. She underwent left total knee arthroplasty. She is seen in her room today.   Review of Systems:  Constitutional: Negative for fever, chills, diaphoresis.  HENT: Negative for headache, congestion, nasal discharge, sore throat, difficulty swallowing.   Eyes: Negative for eye pain, blurred vision, double vision and discharge.  Respiratory: Negative for wheezing. Positive for dry cough and shortness of breath with exertion.     Cardiovascular: Negative for chest pain, palpitations. Positive for leg swelling.  Gastrointestinal: Negative for heartburn, nausea, vomiting, abdominal pain, loss of appetite, melena, diarrhea and constipation. Last bowel movement was this morning. Genitourinary: Negative for dysuria and flank pain.  Musculoskeletal: Negative for back pain, fall. pain medication has been helpful.  Skin: Negative for itching, rash.  Neurological: Positive for occasional dizziness. Psychiatric/Behavioral: Negative for depression.   Past Medical History:  Diagnosis Date  . Anginal pain ([place])    a. NL cath in 2008;  b. Myoview 03/2011: dec uptake along mid anterior wall on stress imaging -> ? attenuation vs. ischemia, EF 65%;  c. Echo 04/2011: EF 55-60%, no RWMA, Gr 2 dd  . Anxiety   . Arthritis   . Asthma   . Bone cancer ([place])   . Cancer ([place])   . CHF (congestive heart failure) ([place])   . CHF (congestive heart failure) ([place])   . Depression   . Diabetes mellitus   . Dyspnea    with exertion  . Frequent urination   . GERD (gastroesophageal reflux disease)   . Headache   . Hypertension   . 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 Obesity   . Pulmonary edema   . Pulmonary embolism ([place])    a. 2008 -> coumadin x 6 mos.  . Sleep apnea    does not use CPAP  . Urinary urgency    Past Surgical History:  Procedure Laterality Date  . ABDOMINAL HYSTERECTOMY  2005  . CARDIAC CATHETERIZATION  Normal  . CARDIAC CATHETERIZATION N/A 09/30/2014   Procedure: Left Heart Cath and Coronary Angiography;  Surgeon: Sherren Mocha, MD;  Location: [facility];  Service: Cardiovascular;  Laterality: N/A;  . LAPAROSCOPIC APPENDECTOMY N/A 06/03/2012   Procedure: APPENDECTOMY LAPAROSCOPIC;  Surgeon: Stark Klein, MD;  Location: [facility];  Service: General;  Laterality: N/A;  . Left knee surgery  2008  . LEG SURGERY    . TONSILLECTOMY    . TOTAL KNEE ARTHROPLASTY Left 08/22/2016   Procedure: TOTAL  KNEE ARTHROPLASTY;  Surgeon: Meredith Pel, MD;  Location: [facility];  Service: Orthopedics;  Laterality: Left;  . TUBAL LIGATION  1989   Social History:   reports that she quit smoking about 31 years ago. Her smoking use included Cigarettes. She has a 7.50 pack-year smoking history. She has never used smokeless tobacco. She reports that she does not drink alcohol or use drugs.  Family History  Problem Relation Age of Onset  . Emphysema Mother   . Arthritis Mother   . Heart failure Mother     alive @ 28  . Asthma Brother   . Heart disease Father     died @ 35's.  . Stroke Father   . Diabetes Sister   . Heart disease Paternal Grandfather   . Colon cancer Maternal Grandfather   . Breast cancer Maternal Aunt     Medications: Allergies as of 08/29/2016      Reactions   Penicillins Swelling   Has patient had a PCN reaction causing immediate rash, facial/tongue/throat swelling, SOB or lightheadedness with hypotension: YES Has patient had a PCN reaction causing severe rash involving mucus membranes or skin necrosis: NO Has patient had a PCN reaction that required hospitalization NO Has patient had a PCN reaction occurring within the last 10 years: NO If all of the above answers are "NO", then may proceed with Cephalosporin use.   Latex Rash   Sulfa Antibiotics Diarrhea, Itching, Rash      Medication List       Accurate as of 08/29/16  2:13 PM. Always use your most recent med list.          albuterol 90 MCG/ACT inhaler Commonly known as:  PROVENTIL,VENTOLIN Inhale 2 puffs into the lungs every 4 (four) hours as needed for shortness of breath.   atorvastatin 40 MG tablet Commonly known as:  LIPITOR Take 40 mg by mouth at bedtime.   benzonatate 200 MG capsule Commonly known as:  TESSALON Take 1 capsule (200 mg total) by mouth 3 (three) times daily as needed for cough (cough unrelieved by robitussin).   budesonide-formoterol 160-4.5 MCG/ACT inhaler Commonly known as:   SYMBICORT Inhale 2 puffs into the lungs 2 (two) times daily.   carvedilol 10 MG 24 hr capsule Commonly known as:  COREG CR Take 10 mg by mouth daily.   cyclobenzaprine 5 MG tablet Commonly known as:  FLEXERIL Take 5 mg by mouth 2 (two) times daily.   furosemide 40 MG tablet Commonly known as:  LASIX Take 40 mg by mouth daily.   gabapentin 300 MG capsule Commonly known as:  NEURONTIN Take 2 capsules (600 mg total) by mouth 3 (three) times daily.   ipratropium-albuterol 0.5-2.5 (3) MG/3ML Soln Commonly known as:  DUONEB Take 3 mLs by nebulization 3 (three) times daily. Use 3 times daily x 4 days, then every 4-6 hours as needed.   isosorbide dinitrate 20 MG tablet Commonly known as:  ISORDIL Take 20 mg by mouth  2 (two) times daily.   KLOR-CON M10 10 MEQ tablet Generic drug:  potassium chloride Take 10 mEq by mouth 2 (two) times daily.   KOMBIGLYZE XR 5-500 MG Tb24 Generic drug:  Saxagliptin-Metformin Take 1 tablet by mouth every morning.   lisinopril 10 MG tablet Commonly known as:  PRINIVIL,ZESTRIL Take 2 tablets by mouth daily.   loratadine 10 MG tablet Commonly known as:  CLARITIN Take 1 tablet (10 mg total) by mouth daily.   nitroGLYCERIN 0.4 MG SL tablet Commonly known as:  NITROSTAT Place 0.4 mg under the tongue every 5 (five) minutes as needed. For chest pain.   oxyCODONE 5 MG immediate release tablet Commonly known as:  Oxy IR/ROXICODONE Take 1-2 tablets (5-10 mg total) by mouth every 3 (three) hours as needed for breakthrough pain.   pantoprazole 40 MG tablet Commonly known as:  PROTONIX Take 1 tablet (40 mg total) by mouth daily.   rivaroxaban 10 MG Tabs tablet Commonly known as:  XARELTO Take 1 tablet (10 mg total) by mouth daily with breakfast.   sertraline 100 MG tablet Commonly known as:  ZOLOFT Take 100 mg by mouth daily.       Immunizations: Immunization History  Administered Date(s) Administered  . Influenza Split 01/09/2012  .  Pneumococcal Polysaccharide-23 01/09/2012     Physical Exam:  Vitals:   08/29/16 1404  BP: 110/60  Pulse: 78  Resp: 18  Temp: 97.6 F (36.4 C)  TempSrc: Oral  SpO2: 99%  Weight: 232 lb 3.2 oz (105.3 kg)  Height: 4\' 11"  (1.499 m)   Body mass index is 46.9 kg/m.  General- adult female, morbidly obese, in no acute distress Head- normocephalic, atraumatic Nose- no maxillary or frontal sinus tenderness, no nasal discharge Throat- moist mucus membrane Eyes- PERRLA, EOMI, no pallor, no icterus, no discharge, normal conjunctiva, normal sclera Neck- no cervical lymphadenopathy Cardiovascular- normal s1,s2, no murmur Respiratory- bilateral clear to auscultation, no wheeze, no rhonchi, no crackles, no use of accessory muscles Abdomen- bowel sounds present, soft, non tender, no guarding or rigidity Musculoskeletal- able to move all 4 extremities, limited left knee range of motion, trace left leg edema Neurological- alert and oriented to person, place and time Skin- warm and dry, left knee surgical incision with aquacel dressing Psychiatry- normal mood and affect    Labs reviewed: Basic Metabolic Panel:  Recent Labs  05/21/16 1020  05/26/16 0709 08/11/16 1408 08/23/16 0546  NA  --   < > 137 142 138  K  --   < > 3.6 3.8 3.6  CL  --   < > 102 105 104  CO2  --   < > 27 29 28   GLUCOSE  --   < > 202* 132* 148*  BUN  --   < > 18 10 6   CREATININE 0.84  < > 0.95 0.81 0.76  CALCIUM  --   < > 8.5* 9.6 8.4*  MG 1.9  --   --   --   --   < > = values in this interval not displayed. Liver Function Tests:  Recent Labs  05/22/16 0913 05/23/16 0629  AST 28 22  ALT 32 26  ALKPHOS 62 56  BILITOT 0.7 0.6  PROT 6.4* 6.1*  ALBUMIN 3.3* 3.2*    Recent Labs  05/22/16 0913  LIPASE 27   No results for input(s): AMMONIA in the last 8760 hours. CBC:  Recent Labs  05/21/16 0800  05/26/16 0709 08/11/16 1408 08/23/16 0546  WBC 6.0  < > 9.9 7.3 9.2  NEUTROABS 1.7  --   --   --    --   HGB 11.9*  < > 12.2 13.8 11.1*  HCT 37.0  < > 38.0 43.4 35.2*  MCV 86.7  < > 86.2 87.7 87.6  PLT 185  < > 191 234 198  < > = values in this interval not displayed. Cardiac Enzymes:  Recent Labs  05/21/16 1020 05/21/16 1756 05/21/16 2331  TROPONINI 0.05* 0.05* 0.05*   BNP: Invalid input(s): POCBNP CBG:  Recent Labs  08/22/16 1625 08/22/16 2131 08/23/16 0711  GLUCAP 119* 160* 153*    Radiological Exams: Dg Chest 2 View  Result Date: 08/11/2016 CLINICAL DATA:  Preop for left knee replacement EXAM: CHEST  2 VIEW COMPARISON:  05/21/2016 FINDINGS: Cardiomediastinal silhouette is stable. No infiltrate or pleural effusion. No pulmonary edema. Mild degenerative changes mid thoracic spine. Moderate degenerative changes lower thoracic spine. Again noted prominent fat pad in right cardiophrenic angle. IMPRESSION: No active cardiopulmonary disease. Degenerative changes thoracic spine. Electronically Signed   By: Lahoma Crocker M.D.   On: 08/11/2016 16:05   Xr C-arm No Report  Result Date: 08/02/2016 Please see Notes or Procedures tab for imaging impression.   Assessment/Plan  Unsteady gait Will have patient work with PT/OT as tolerated to regain strength and restore function.  Fall precautions are in place.  Left knee OA s/p left total knee arthroplasty. Will need follow up with orthopedic. Will have patient work with PT/OT as tolerated to regain strength and restore function.  Fall precautions are in place. Add ted hose to help with leg edema. Continue cyclobenzaprine 5 mg bid for muscle spasm. Continue roxicodone 5 mg 1-2 tab q3h prn pain. Get PMR consult. Continue xarelto 10 mg daily x 2 weeks for dvt prophylaxis.   Blood loss anemia Post op, monitor cbc  Hypertension Monitor BP, continue isosorbide 20 mg bid, lisinopril 20 mg daily and coreg 10 mg daily.   Chronic diastolic chf Monitor clinically, continue coreg, isosorbide, lisinopril and lasix. Monitor BMP  Type 2 DM  in obese Lab Results  Component Value Date   HGBA1C 7.4 (H) 08/11/2016   Monitor cbg. Continue saxagliptin/metformin and monitor cbg.   gerd Continue protonix 40 mg daily  Chronic depression Stable mood, on zoloft  Asthma Monitor her breathing, continue proventil and duoneb  Cervical radiculopathy Continue neurontin 600 mg tid and monitor   Goals of care: short term rehabilitation   Labs/tests ordered:  Family/ staff Communication: reviewed care plan with patient and nursing supervisor    Blanchie Serve, MD Internal Medicine [place], [place] [id number] Cell Phone (Monday-Friday 8 am - 5 pm): [phone] On Call: [phone] and follow prompts after 5 pm and on weekends Office Phone: [phone] Office Fax: [phone]

## 2016-08-30 ENCOUNTER — Non-Acute Institutional Stay (SKILLED_NURSING_FACILITY): Payer: Medicare Other | Admitting: Adult Health

## 2016-08-30 ENCOUNTER — Encounter: Payer: Self-pay | Admitting: Adult Health

## 2016-08-30 DIAGNOSIS — R2681 Unsteadiness on feet: Secondary | ICD-10-CM

## 2016-08-30 DIAGNOSIS — F329 Major depressive disorder, single episode, unspecified: Secondary | ICD-10-CM | POA: Diagnosis not present

## 2016-08-30 DIAGNOSIS — M5412 Radiculopathy, cervical region: Secondary | ICD-10-CM

## 2016-08-30 DIAGNOSIS — E1169 Type 2 diabetes mellitus with other specified complication: Secondary | ICD-10-CM

## 2016-08-30 DIAGNOSIS — E785 Hyperlipidemia, unspecified: Secondary | ICD-10-CM | POA: Diagnosis not present

## 2016-08-30 DIAGNOSIS — I10 Essential (primary) hypertension: Secondary | ICD-10-CM

## 2016-08-30 DIAGNOSIS — M1712 Unilateral primary osteoarthritis, left knee: Secondary | ICD-10-CM

## 2016-08-30 DIAGNOSIS — J4520 Mild intermittent asthma, uncomplicated: Secondary | ICD-10-CM

## 2016-08-30 DIAGNOSIS — K219 Gastro-esophageal reflux disease without esophagitis: Secondary | ICD-10-CM | POA: Diagnosis not present

## 2016-08-30 DIAGNOSIS — E876 Hypokalemia: Secondary | ICD-10-CM

## 2016-08-30 DIAGNOSIS — E669 Obesity, unspecified: Secondary | ICD-10-CM

## 2016-08-30 DIAGNOSIS — D500 Iron deficiency anemia secondary to blood loss (chronic): Secondary | ICD-10-CM | POA: Diagnosis not present

## 2016-08-30 DIAGNOSIS — I5032 Chronic diastolic (congestive) heart failure: Secondary | ICD-10-CM

## 2016-08-30 DIAGNOSIS — F32A Depression, unspecified: Secondary | ICD-10-CM

## 2016-08-30 NOTE — Progress Notes (Signed)
DATE:  08/30/2016   MRN:  [id number]  BIRTHDAY: 1961-03-21  Facility:  Nursing Home Location:  [facility] Room Number: 301-B  LEVEL OF CARE:  SNF (31)  Contact Information    Name Relation Home Work [place] Daughter   [phone]   Neal,April Daughter [phone]         Code Status History    Date Active Date Inactive Code Status Order ID Comments User Context   08/22/2016 12:31 PM 08/25/2016  5:35 PM Full Code [id number]  Meredith Pel, MD Inpatient   05/21/2016 10:19 AM 05/28/2016 10:43 PM Full Code [id number]  Brenton Grills, PA-C ED   09/30/2014 11:07 AM 09/30/2014  8:11 PM Full Code [id number]  Sherren Mocha, MD Inpatient   09/28/2014  1:04 AM 09/30/2014 11:07 AM Full Code [id number]  Etta Quill, DO ED   05/24/2014 12:54 PM 05/27/2014  8:59 PM Full Code [id number]  Melton Alar, PA-C Inpatient   10/28/2012 10:39 PM 10/30/2012  2:47 PM Full Code [id number]  Precious Reel, MD Inpatient   04/23/2011  5:27 AM 04/23/2011  7:46 PM Full Code [id number]  Ferd Glassing, RN ED       Chief Complaint  Patient presents with  . Discharge Note    HISTORY OF PRESENT ILLNESS:  This is a 16-YO female seen for a discharge visit.  She will transfer to [facility] on 08/30/2016 per her request.  She has been admitted to [facility] on 10/15/16 from [facility] admission date 08/22/16 thru 08/25/16 for arthritis of left knee for which she had left total knee arthroplasty on 08/22/16. She has PMH anxiety, DM, CHF, pulmonary embolism and sleep apnea does not use CPAP.    PAST MEDICAL HISTORY:  Past Medical History:  Diagnosis Date  . Anginal pain ([place])    a. NL cath in 2008;  b. Myoview 03/2011: dec uptake along mid anterior wall on stress imaging -> ? attenuation vs. ischemia, EF 65%;  c. Echo 04/2011: EF 55-60%, no RWMA, Gr 2 dd  . Anxiety   . Arthritis   . Asthma   . Bone cancer ([place])   .  Cancer ([place])   . CHF (congestive heart failure) ([place])   . CHF (congestive heart failure) ([place])   . Depression   . Diabetes mellitus   . Dyspnea    with exertion  . Frequent urination   . GERD (gastroesophageal reflux disease)   . Headache   . Hypertension   . 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 Obesity   . Pulmonary edema   . Pulmonary embolism ([place])    a. 2008 -> coumadin x 6 mos.  . Sleep apnea    does not use CPAP  . Urinary urgency      CURRENT MEDICATIONS: Reviewed  Patient's Medications  New Prescriptions   No medications on file  Previous Medications   ALBUTEROL (PROVENTIL,VENTOLIN) 90 MCG/ACT INHALER    Inhale 2 puffs into the lungs every 4 (four) hours as needed for shortness of breath.    ATORVASTATIN (LIPITOR) 40 MG TABLET    Take 40 mg by mouth at bedtime.    BENZONATATE (TESSALON) 200 MG CAPSULE    Take 1 capsule (200 mg total) by mouth 3 (three) times daily as needed for cough (cough unrelieved by robitussin).   BUDESONIDE-FORMOTEROL (SYMBICORT) 160-4.5  MCG/ACT INHALER    Inhale 2 puffs into the lungs 2 (two) times daily.   CARVEDILOL (COREG CR) 10 MG 24 HR CAPSULE    Take 10 mg by mouth daily.   CYCLOBENZAPRINE (FLEXERIL) 5 MG TABLET    Take 5 mg by mouth 2 (two) times daily.    FUROSEMIDE (LASIX) 40 MG TABLET    Take 40 mg by mouth daily.   GABAPENTIN (NEURONTIN) 300 MG CAPSULE    Take 2 capsules (600 mg total) by mouth 3 (three) times daily.   IPRATROPIUM-ALBUTEROL (DUONEB) 0.5-2.5 (3) MG/3ML SOLN    Take 3 mLs by nebulization every 6 (six) hours as needed.   ISOSORBIDE DINITRATE (ISORDIL) 20 MG TABLET    Take 20 mg by mouth 2 (two) times daily.    KLOR-CON M10 10 MEQ TABLET    Take 10 mEq by mouth 2 (two) times daily.    KOMBIGLYZE XR 5-500 MG TB24    Take 1 tablet by mouth every morning.    LISINOPRIL (PRINIVIL,ZESTRIL) 10 MG TABLET    Take 2 tablets by mouth daily. Take 2 tabs to = 20 mg qd   LORATADINE (CLARITIN) 10 MG TABLET    Take 1  tablet (10 mg total) by mouth daily.   NITROGLYCERIN (NITROSTAT) 0.4 MG SL TABLET    Place 0.4 mg under the tongue every 5 (five) minutes as needed. For chest pain.   OXYCODONE (OXY IR/ROXICODONE) 5 MG IMMEDIATE RELEASE TABLET    Take 1-2 tablets (5-10 mg total) by mouth every 3 (three) hours as needed for breakthrough pain.   PANTOPRAZOLE (PROTONIX) 40 MG TABLET    Take 1 tablet (40 mg total) by mouth daily.   RIVAROXABAN (XARELTO) 10 MG TABS TABLET    Take 1 tablet (10 mg total) by mouth daily with breakfast.   SERTRALINE (ZOLOFT) 100 MG TABLET    Take 100 mg by mouth daily.   Modified Medications   No medications on file  Discontinued Medications   IPRATROPIUM-ALBUTEROL (DUONEB) 0.5-2.5 (3) MG/3ML SOLN    Take 3 mLs by nebulization 3 (three) times daily. Use 3 times daily x 4 days, then every 4-6 hours as needed.     Allergies  Allergen Reactions  . Penicillins Swelling    Has patient had a PCN reaction causing immediate rash, facial/tongue/throat swelling, SOB or lightheadedness with hypotension: YES Has patient had a PCN reaction causing severe rash involving mucus membranes or skin necrosis: NO Has patient had a PCN reaction that required hospitalization NO Has patient had a PCN reaction occurring within the last 10 years: NO If all of the above answers are "NO", then may proceed with Cephalosporin use.  . Latex Rash  . Sulfa Antibiotics Diarrhea, Itching and Rash     REVIEW OF SYSTEMS:  GENERAL: no change in appetite, no fatigue, no weight changes, no fever, chills or weakness EYES: Denies change in vision, dry eyes, eye pain, itching or discharge EARS: Denies change in hearing, ringing in ears, or earache NOSE: Denies nasal congestion or epistaxis MOUTH and THROAT: Denies oral discomfort, gingival pain or bleeding, pain from teeth or hoarseness   RESPIRATORY: no cough, SOB, DOE, wheezing, hemoptysis CARDIAC: no chest pain, edema or palpitations GI: no abdominal pain,  diarrhea, constipation, heart burn, nausea or vomiting GU: Denies dysuria, frequency, hematuria, incontinence, or discharge PSYCHIATRIC: Denies feeling of depression or anxiety. No report of hallucinations, insomnia, paranoia, or agitation   PHYSICAL EXAMINATION  GENERAL APPEARANCE: Well nourished. In  no acute distress. Morbidly obese SKIN:  Left knee surgical incision is covered with aquacel dressing, dry and no erythema HEAD: Normal in size and contour. No evidence of trauma EYES: Lids open and close normally. No blepharitis, entropion or ectropion. PERRL. Conjunctivae are clear and sclerae are white. Lenses are without opacity EARS: Pinnae are normal. Patient hears normal voice tunes of the examiner MOUTH and THROAT: Lips are without lesions. Oral mucosa is moist and without lesions. Tongue is normal in shape, size, and color and without lesions NECK: supple, trachea midline, no neck masses, no thyroid tenderness, no thyromegaly LYMPHATICS: no LAN in the neck, no supraclavicular LAN RESPIRATORY: breathing is even & unlabored, BS CTAB CARDIAC: RRR, no murmur,no extra heart sounds, no edema GI: abdomen soft, normal BS, no masses, no tenderness, no hepatomegaly, no splenomegaly EXTREMITIES:  Able to move X 4 extremities PSYCHIATRIC: Alert and oriented X 3. Affect and behavior are appropriate   LABS/RADIOLOGY: Labs reviewed:  08/30/16   WBC 9.2 hemoglobin 9.5 hematocr it 30.2 MCV 90.1 platelet 350 sodium 143 K4.1 BUN 11 creatinine 0.80 calcium 9.0 GFR >98 Basic Metabolic Panel:  Recent Labs  05/21/16 1020  05/26/16 0709 08/11/16 1408 08/23/16 0546  NA  --   < > 137 142 138  K  --   < > 3.6 3.8 3.6  CL  --   < > 102 105 104  CO2  --   < > 27 29 28   GLUCOSE  --   < > 202* 132* 148*  BUN  --   < > 18 10 6   CREATININE 0.84  < > 0.95 0.81 0.76  CALCIUM  --   < > 8.5* 9.6 8.4*  MG 1.9  --   --   --   --   < > = values in this interval not displayed. Liver Function Tests:  Recent  Labs  05/22/16 0913 05/23/16 0629  AST 28 22  ALT 32 26  ALKPHOS 62 56  BILITOT 0.7 0.6  PROT 6.4* 6.1*  ALBUMIN 3.3* 3.2*    Recent Labs  05/22/16 0913  LIPASE 27   CBC:  Recent Labs  05/21/16 0800  05/26/16 0709 08/11/16 1408 08/23/16 0546  WBC 6.0  < > 9.9 7.3 9.2  NEUTROABS 1.7  --   --   --   --   HGB 11.9*  < > 12.2 13.8 11.1*  HCT 37.0  < > 38.0 43.4 35.2*  MCV 86.7  < > 86.2 87.7 87.6  PLT 185  < > 191 234 198  < > = values in this interval not displayed. Cardiac Enzymes:  Recent Labs  05/21/16 1020 05/21/16 1756 05/21/16 2331  TROPONINI 0.05* 0.05* 0.05*   CBG:  Recent Labs  08/22/16 1625 08/22/16 2131 08/23/16 0711  GLUCAP 119* 160* 153*      Dg Chest 2 View  Result Date: 08/11/2016 CLINICAL DATA:  Preop for left knee replacement EXAM: CHEST  2 VIEW COMPARISON:  05/21/2016 FINDINGS: Cardiomediastinal silhouette is stable. No infiltrate or pleural effusion. No pulmonary edema. Mild degenerative changes mid thoracic spine. Moderate degenerative changes lower thoracic spine. Again noted prominent fat pad in right cardiophrenic angle. IMPRESSION: No active cardiopulmonary disease. Degenerative changes thoracic spine. Electronically Signed   By: Lahoma Crocker M.D.   On: 08/11/2016 16:05   Xr C-arm No Report  Result Date: 08/02/2016 Please see Notes or Procedures tab for imaging impression.   ASSESSMENT/PLAN:  Unsteady gait - will continue  rehabilitation with PT and OT @ [facility], for therapeutic strengthening exercises; fall precautions  Left knee osteoarthritis - S/P left total knee arthroplasty on 08/22/16, will continue rehabilitation with PT and OT @ [facility], for therapeutic strengthening exercises; continue cyclobenzaprine 5 mg 1 tab by mouth twice a day for muscle spasm; oxycodone 5 mg 1-2 tabs by mouth every 3 hours when necessary for pain; Xarelto 10 mg 1 tab by mouth daily 2 weeks for DVT  prophylaxis; WBAT; follow-up with orthopedics in 2 weeks  Hypertension - continue Coreg CR 10 mg by mouth daily and lisinopril 10 mg 2 tabs = 20 mg by mouth daily  Chronic diastolic CHF - continue Coreg CR 10 mg 1 tab by mouth daily, a 640 mg 1 tab by mouth daily, lisinopril 20 mg by mouth daily and isosorbide DN 20 mg 1 tab by mouth twice a day  Hypokalemia - K 4.1 , continue K[place]-Con 10 MDQ on tab by mouth twice a day  Diabetes mellitus, type II - continue Kombiglyze XR 5-500 mg 1 tab by mouth every morning, CBG twice a day Lab Results  Component Value Date   HGBA1C 7.4 (H) 08/11/2016   GERD - continue Protonix 40 mg 1 tab by mouth daily  Hyperlipidemia - continue atorvastatin 40 mg 1 tab by mouth daily at bedtime  Chronic depression - continue Zoloft 100 mg 1 tab by mouth daily  Cervical radiculopathy - continue Neurontin 300 mg 2 capsules = 600 mg by mouth TID  Asthma - no SOB nor wheezing; continue Proventil HFA 90 g inhaler inhale 2 puffs in 2 lungs every 4 hours when necessary and DuoNeb nebulization every 6 hours when necessary  Anemia, acute blood loss -  hgb (taken today 08/30/16) 9.5; stable     I have filled out patient's discharge paperwork and written prescriptions.  Patient will continue rehabilitation with PT and OT @ [facility].  DME provided:  None  Total discharge time: Less than 30 minutes  Discharge time involved coordination of the discharge process with social worker, nursing staff and therapy department. Jaymes Graff C. [place] - NP    [employer] [phone]

## 2016-08-31 ENCOUNTER — Telehealth (INDEPENDENT_AMBULATORY_CARE_PROVIDER_SITE_OTHER): Payer: Self-pay

## 2016-08-31 ENCOUNTER — Non-Acute Institutional Stay (SKILLED_NURSING_FACILITY): Payer: Medicare Other | Admitting: Internal Medicine

## 2016-08-31 ENCOUNTER — Encounter: Payer: Self-pay | Admitting: Internal Medicine

## 2016-08-31 DIAGNOSIS — E669 Obesity, unspecified: Secondary | ICD-10-CM | POA: Diagnosis not present

## 2016-08-31 DIAGNOSIS — E1169 Type 2 diabetes mellitus with other specified complication: Secondary | ICD-10-CM

## 2016-08-31 DIAGNOSIS — R2681 Unsteadiness on feet: Secondary | ICD-10-CM

## 2016-08-31 DIAGNOSIS — I10 Essential (primary) hypertension: Secondary | ICD-10-CM | POA: Diagnosis not present

## 2016-08-31 DIAGNOSIS — I5032 Chronic diastolic (congestive) heart failure: Secondary | ICD-10-CM | POA: Diagnosis not present

## 2016-08-31 DIAGNOSIS — D500 Iron deficiency anemia secondary to blood loss (chronic): Secondary | ICD-10-CM | POA: Diagnosis not present

## 2016-08-31 DIAGNOSIS — M1712 Unilateral primary osteoarthritis, left knee: Secondary | ICD-10-CM | POA: Diagnosis not present

## 2016-08-31 NOTE — Telephone Encounter (Signed)
[name] p.t. Called wanting to know how patient would need to continue immobilizer. I s/w Dr Marlou Sa and he advised when patient could do 15 SLR. This information was provided back to P.T.

## 2016-08-31 NOTE — Progress Notes (Signed)
LOCATION: [facility]  PCP: Velna Hatchet, MD   Code Status: Full Code  Goals of care: Advanced Directive information Advanced Directives 08/22/2016  Does Patient Have a Medical Advance Directive? No  Would patient like information on creating a medical advance directive? -  Pre-existing out of facility DNR order (yellow form or pink MOST form) -       Extended Emergency Contact Information Primary Emergency Contact: White,Ebony Address: [place], [place] [address] [place] Phone: [phone] Relation: Daughter Secondary Emergency Contact: Neal,April  [place] Phone: [phone] Relation: Daughter   Allergies  Allergen Reactions  . Penicillins Swelling    Has patient had a PCN reaction causing immediate rash, facial/tongue/throat swelling, SOB or lightheadedness with hypotension: YES Has patient had a PCN reaction causing severe rash involving mucus membranes or skin necrosis: NO Has patient had a PCN reaction that required hospitalization NO Has patient had a PCN reaction occurring within the last 10 years: NO If all of the above answers are "NO", then may proceed with Cephalosporin use.  . Latex Rash  . Sulfa Antibiotics Diarrhea, Itching and Rash    Chief Complaint  Patient presents with  . New Admit To SNF    New Admission Visit      HPI:  Patient is a 56 y.o. female seen today for short term rehabilitation post hospital admission from 08/22/16-08/25/16 with left knee OA s/p left total knee arthroplasty. She underwent STR at another facility for 5 days and is now here to undergo rehabilitation. She is seen in her room today.    Review of Systems:  Constitutional: Negative for fever, chills.  HENT: Negative for headache, congestion, nasal discharge, sore throat, difficulty swallowing.   Eyes: Negative for blurred vision, double vision and discharge.  Respiratory: Negative for wheezing, cough  and shortness of breath.   Cardiovascular: Negative for chest pain, palpitations. Positive for leg swelling.  Gastrointestinal: Negative for heartburn, nausea, vomiting, abdominal pain, loss of appetite, melena. Last bowel movement was this morning. Genitourinary: Negative for dysuria.  Musculoskeletal: Negative for back pain, fall. pain medication has been helpful.  Skin: Negative for itching, rash.  Psychiatric/Behavioral: Negative for depression.   Past Medical History:  Diagnosis Date  . Anginal pain ([place])    a. NL cath in 2008;  b. Myoview 03/2011: dec uptake along mid anterior wall on stress imaging -> ? attenuation vs. ischemia, EF 65%;  c. Echo 04/2011: EF 55-60%, no RWMA, Gr 2 dd  . Anxiety   . Arthritis   . Asthma   . Bone cancer ([place])   . Cancer ([place])   . CHF (congestive heart failure) ([place])   . CHF (congestive heart failure) ([place])   . Depression   . Diabetes mellitus   . Dyspnea    with exertion  . Frequent urination   . GERD (gastroesophageal reflux disease)   . Headache   . Hypertension   . 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 Obesity   . Pulmonary edema   . Pulmonary embolism ([place])    a. 2008 -> coumadin x 6 mos.  . Sleep apnea    does not use CPAP  . Urinary urgency    Past Surgical History:  Procedure Laterality Date  . ABDOMINAL HYSTERECTOMY  2005  . CARDIAC CATHETERIZATION     Normal  .  CARDIAC CATHETERIZATION N/A 09/30/2014   Procedure: Left Heart Cath and Coronary Angiography;  Surgeon: Sherren Mocha, MD;  Location: [facility];  Service: Cardiovascular;  Laterality: N/A;  . LAPAROSCOPIC APPENDECTOMY N/A 06/03/2012   Procedure: APPENDECTOMY LAPAROSCOPIC;  Surgeon: Stark Klein, MD;  Location: [facility];  Service: General;  Laterality: N/A;  . Left knee surgery  2008  . LEG SURGERY    . TONSILLECTOMY    . TOTAL KNEE ARTHROPLASTY Left 08/22/2016   Procedure: TOTAL KNEE ARTHROPLASTY;  Surgeon: Meredith Pel, MD;  Location: [facility];  Service: Orthopedics;  Laterality: Left;  . TUBAL LIGATION  1989   Social History:   reports that she quit smoking about 31 years ago. Her smoking use included Cigarettes. She has a 7.50 pack-year smoking history. She has never used smokeless tobacco. She reports that she does not drink alcohol or use drugs.  Family History  Problem Relation Age of Onset  . Emphysema Mother   . Arthritis Mother   . Heart failure Mother        alive @ 26  . Asthma Brother   . Heart disease Father        died @ 60's.  . Stroke Father   . Diabetes Sister   . Heart disease Paternal Grandfather   . Colon cancer Maternal Grandfather   . Breast cancer Maternal Aunt     Medications: Allergies as of 08/31/2016      Reactions   Penicillins Swelling   Has patient had a PCN reaction causing immediate rash, facial/tongue/throat swelling, SOB or lightheadedness with hypotension: YES Has patient had a PCN reaction causing severe rash involving mucus membranes or skin necrosis: NO Has patient had a PCN reaction that required hospitalization NO Has patient had a PCN reaction occurring within the last 10 years: NO If all of the above answers are "NO", then may proceed with Cephalosporin use.   Latex Rash   Sulfa Antibiotics Diarrhea, Itching, Rash      Medication List       Accurate as of 08/31/16  2:20 PM. Always use your most recent med list.          albuterol 108 (90 Base) MCG/ACT inhaler Commonly known as:  PROVENTIL HFA;VENTOLIN HFA Inhale 2 puffs into the lungs every 4 (four) hours as needed for wheezing or shortness of breath.   atorvastatin 40 MG tablet Commonly known as:  LIPITOR Take 40 mg by mouth at bedtime.   benzonatate 200 MG capsule Commonly known as:  TESSALON Take 1 capsule (200 mg total) by mouth 3 (three) times daily as needed for cough (cough unrelieved by robitussin).   budesonide-formoterol 160-4.5 MCG/ACT inhaler Commonly known as:  SYMBICORT Inhale 2 puffs into the  lungs 2 (two) times daily.   carvedilol 10 MG 24 hr capsule Commonly known as:  COREG CR Take 10 mg by mouth daily.   cyclobenzaprine 5 MG tablet Commonly known as:  FLEXERIL Take 5 mg by mouth 2 (two) times daily.   furosemide 40 MG tablet Commonly known as:  LASIX Take 40 mg by mouth daily.   gabapentin 300 MG capsule Commonly known as:  NEURONTIN Take 2 capsules (600 mg total) by mouth 3 (three) times daily.   ipratropium-albuterol 0.5-2.5 (3) MG/3ML Soln Commonly known as:  DUONEB Take 3 mLs by nebulization 3 (three) times daily. Use 3 times daily for 4 days, then 4-6 hours as needed. Stop date 09/04/16   isosorbide dinitrate 20 MG  tablet Commonly known as:  ISORDIL Take 20 mg by mouth 2 (two) times daily.   KLOR-CON M10 10 MEQ tablet Generic drug:  potassium chloride Take 10 mEq by mouth 2 (two) times daily.   KOMBIGLYZE XR 5-500 MG Tb24 Generic drug:  Saxagliptin-Metformin Take 1 tablet by mouth every morning.   lisinopril 10 MG tablet Commonly known as:  PRINIVIL,ZESTRIL Take 2 tablets by mouth daily. Take 2 tabs to = 20 mg qd   loratadine 10 MG tablet Commonly known as:  CLARITIN Take 1 tablet (10 mg total) by mouth daily.   nitroGLYCERIN 0.4 MG SL tablet Commonly known as:  NITROSTAT Place 0.4 mg under the tongue every 5 (five) minutes as needed. For chest pain.   oxyCODONE 5 MG immediate release tablet Commonly known as:  Oxy IR/ROXICODONE Take 1-2 tablets (5-10 mg total) by mouth every 3 (three) hours as needed for breakthrough pain.   pantoprazole 40 MG tablet Commonly known as:  PROTONIX Take 1 tablet (40 mg total) by mouth daily.   rivaroxaban 10 MG Tabs tablet Commonly known as:  XARELTO Take 1 tablet (10 mg total) by mouth daily with breakfast.   sertraline 100 MG tablet Commonly known as:  ZOLOFT Take 100 mg by mouth daily.       Immunizations: Immunization History  Administered Date(s) Administered  . Influenza Split 01/09/2012  .  PPD Test 08/31/2016  . Pneumococcal Polysaccharide-23 01/09/2012     Physical Exam:  Vitals:   08/31/16 1405  BP: 127/62  Pulse: 90  Resp: 18  Temp: 99 F (37.2 C)  TempSrc: Oral  SpO2: 98%  Weight: 232 lb 3.2 oz (105.3 kg)  Height: 4\' 11"  (1.499 m)   Body mass index is 46.9 kg/m.  General- adult female, morbidly obese, in no acute distress Head- normocephalic, atraumatic Nose- no maxillary or frontal sinus tenderness, no nasal discharge Throat- moist mucus membrane Eyes- PERRLA, EOMI, no pallor, no icterus, no discharge, normal conjunctiva, normal sclera Neck- no cervical lymphadenopathy Cardiovascular- normal s1,s2, no murmur Respiratory- bilateral clear to auscultation, no wheeze, no rhonchi, no crackles, no use of accessory muscles Abdomen- bowel sounds present, soft, non tender, no guarding or rigidity Musculoskeletal- able to move all 4 extremities, limited left knee range of motion, trace left leg edema Neurological- alert and oriented to person, place and time Skin- warm and dry, left knee surgical incision with aquacel dressing Psychiatry- normal mood and affect    Labs reviewed: Basic Metabolic Panel:  Recent Labs  05/21/16 1020  05/26/16 0709 08/11/16 1408 08/23/16 0546  NA  --   < > 137 142 138  K  --   < > 3.6 3.8 3.6  CL  --   < > 102 105 104  CO2  --   < > 27 29 28   GLUCOSE  --   < > 202* 132* 148*  BUN  --   < > 18 10 6   CREATININE 0.84  < > 0.95 0.81 0.76  CALCIUM  --   < > 8.5* 9.6 8.4*  MG 1.9  --   --   --   --   < > = values in this interval not displayed. Liver Function Tests:  Recent Labs  05/22/16 0913 05/23/16 0629  AST 28 22  ALT 32 26  ALKPHOS 62 56  BILITOT 0.7 0.6  PROT 6.4* 6.1*  ALBUMIN 3.3* 3.2*    Recent Labs  05/22/16 0913  LIPASE 27   No  results for input(s): AMMONIA in the last 8760 hours. CBC:  Recent Labs  05/21/16 0800  05/26/16 0709 08/11/16 1408 08/23/16 0546  WBC 6.0  < > 9.9 7.3 9.2    NEUTROABS 1.7  --   --   --   --   HGB 11.9*  < > 12.2 13.8 11.1*  HCT 37.0  < > 38.0 43.4 35.2*  MCV 86.7  < > 86.2 87.7 87.6  PLT 185  < > 191 234 198  < > = values in this interval not displayed. Cardiac Enzymes:  Recent Labs  05/21/16 1020 05/21/16 1756 05/21/16 2331  TROPONINI 0.05* 0.05* 0.05*   BNP: Invalid input(s): POCBNP CBG:  Recent Labs  08/22/16 1625 08/22/16 2131 08/23/16 0711  GLUCAP 119* 160* 153*    Radiological Exams: Dg Chest 2 View  Result Date: 08/11/2016 CLINICAL DATA:  Preop for left knee replacement EXAM: CHEST  2 VIEW COMPARISON:  05/21/2016 FINDINGS: Cardiomediastinal silhouette is stable. No infiltrate or pleural effusion. No pulmonary edema. Mild degenerative changes mid thoracic spine. Moderate degenerative changes lower thoracic spine. Again noted prominent fat pad in right cardiophrenic angle. IMPRESSION: No active cardiopulmonary disease. Degenerative changes thoracic spine. Electronically Signed   By: Lahoma Crocker M.D.   On: 08/11/2016 16:05   Xr C-arm No Report  Result Date: 08/02/2016 Please see Notes or Procedures tab for imaging impression.   Assessment/Plan  Unsteady gait Will have patient work with PT/OT as tolerated to regain strength and restore function.  Fall precautions are in place.  Left knee OA s/p left total knee arthroplasty. Will need follow up with orthopedic. Will have patient work with PT/OT as tolerated to regain strength and restore function.  Fall precautions are in place. Continue cyclobenzaprine 5 mg bid for muscle spasm. Continue oxicodone 5 mg 1-2 tab q3h prn pain. Get PMR consult. Continue xarelto 10 mg daily x 2 weeks for dvt prophylaxis. Add ted hose to help with leg edema.   Blood loss anemia Post op, monitor cbc  Hypertension Monitor BP, currently on lisinopril 20 mg daily and coreg 10 mg daily. Elevated BP of 150/90 at present. Patient has not received her coreg (not arrived from pharmacy). Of note,  patient was receiving isosorbide dinitrate 20 mg bid at other facility. She is not receiving this medication here for unclear reason. Start her on isosorbide and monitor BP bid.   Chronic diastolic chf Monitor clinically, continue coreg, isosorbide, lisinopril and lasix. Monitor BMP  Type 2 DM in obese Lab Results  Component Value Date   HGBA1C 7.4 (H) 08/11/2016   Monitor cbg. Continue saxagliptin/metformin and monitor cbg.     Goals of care: short term rehabilitation   Labs/tests ordered: cbc, cmp 09/04/16  Family/ staff Communication: reviewed care plan with patient and nursing supervisor    Blanchie Serve, MD Internal Medicine [place], [place] [address] Cell Phone (Monday-Friday 8 am - 5 pm): [phone] On Call: [phone] and follow prompts after 5 pm and on weekends Office Phone: [phone] Office Fax: [phone]

## 2016-09-04 ENCOUNTER — Ambulatory Visit (INDEPENDENT_AMBULATORY_CARE_PROVIDER_SITE_OTHER): Payer: Medicare Other | Admitting: Orthopedic Surgery

## 2016-09-04 ENCOUNTER — Ambulatory Visit (INDEPENDENT_AMBULATORY_CARE_PROVIDER_SITE_OTHER): Payer: Medicare Other

## 2016-09-04 ENCOUNTER — Encounter (INDEPENDENT_AMBULATORY_CARE_PROVIDER_SITE_OTHER): Payer: Self-pay | Admitting: Orthopedic Surgery

## 2016-09-04 DIAGNOSIS — Z96652 Presence of left artificial knee joint: Secondary | ICD-10-CM

## 2016-09-06 LAB — BASIC METABOLIC PANEL
BUN: 10 mg/dL (ref 4–21)
Creatinine: 0.8 mg/dL (ref 0.5–1.1)
Glucose: 145 mg/dL
Potassium: 3.9 mmol/L (ref 3.4–5.3)
Sodium: 144 mmol/L (ref 137–147)

## 2016-09-06 LAB — HEPATIC FUNCTION PANEL
ALT: 19 U/L (ref 7–35)
AST: 15 U/L (ref 13–35)
Alkaline Phosphatase: 69 U/L (ref 25–125)
Bilirubin, Total: 0.4 mg/dL

## 2016-09-06 LAB — CBC AND DIFFERENTIAL
HCT: 32 % — AB (ref 36–46)
Hemoglobin: 10 g/dL — AB (ref 12.0–16.0)
Platelets: 344 10*3/uL (ref 150–399)
WBC: 6.1 10^3/mL

## 2016-09-07 ENCOUNTER — Encounter: Payer: Self-pay | Admitting: *Deleted

## 2016-09-07 NOTE — Progress Notes (Signed)
Post-Op Visit Note   Patient: [name]           Date of Birth: 1960-09-01           MRN: [id number] Visit Date: 09/04/2016 PCP: Velna Hatchet, MD   Assessment & Plan:  Chief Complaint:  Chief Complaint  Patient presents with  . Left Knee - Follow-up, Routine Post Op   Visit Diagnoses:  1. Presence of left artificial knee joint     Plan: [name] is a 56 year old patient is now 2 weeks out from left total knee replacement.  She is doing physical therapy daily at the facility.  On exam the incision looks good.  She has good extension but is flexing only to about 50.  Plan at this time is to have her work more diligently on achieving full flexion.  Two-week return for clinical recheck.  Radiographs look good in terms of component alignment and position  Follow-Up Instructions: No Follow-up on file.   Orders:  Orders Placed This Encounter  Procedures  . XR Knee 1-2 Views Left   No orders of the defined types were placed in this encounter.   Imaging: No results found.  PMFS History: Patient Active Problem List   Diagnosis Date Noted  . Arthritis of knee 08/22/2016  . Cervical radiculopathy 07/26/2016  . Cervical disc disorder with radiculopathy 07/26/2016  . Right arm pain 06/29/2016  . Cervicalgia 06/29/2016  . Primary osteoarthritis of left knee 06/29/2016  . Acute respiratory failure with hypoxia ([place]) 05/28/2016  . CAP (community acquired pneumonia) 05/22/2016  . Community acquired pneumonia 05/21/2016  . Hypersomnia with sleep apnea 11/18/2015  . Lethargy 11/18/2015  . Chest pain 09/30/2014  . Abnormal cardiac function test 09/29/2014  . Nausea with vomiting   . Chest pain with moderate risk for cardiac etiology 09/28/2014  . HTN (hypertension) 09/28/2014  . Hypokalemia   . Paresthesia 06/18/2014  . Numbness and tingling of left arm and leg   . Unstable angina ([place]) 05/24/2014  . OSA on CPAP 05/24/2014  . Diabetes mellitus type 2 in obese  ([place]) 10/29/2012  . S/P laparoscopic appendectomy 06/04/2012  . Acute appendicitis with generalized peritonitis 06/04/2012  . Diastolic CHF ([place]) 90/24/0973  . Mediastinal mass 01/09/2012  . Hypotension 01/08/2012  . SOB (shortness of breath) 06/20/2011  . Mediastinal abnormality 06/20/2011  . Dyslipidemia 12/23/2009  . GLAUCOMA 12/23/2009  . ARTHRITIS 12/23/2009  . 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 Morbid obesity-BMI 45 09/13/2006  . ANXIETY STATE NOS 09/13/2006  . DISORDER, DEPRESSIVE NEC 09/13/2006  . CARPAL TUNNEL SYNDROME, MILD 09/13/2006  . Unspecified essential hypertension 09/13/2006  . IBS 09/13/2006  . DEGENERATION, LUMBAR/LUMBOSACRAL DISC 09/13/2006  . SYMPTOM, SWELLING/MASS/LUMP IN CHEST 09/13/2006  . PULMONARY EMBOLISM, HX OF 09/13/2006   Past Medical History:  Diagnosis Date  . Anginal pain ([place])    a. NL cath in 2008;  b. Myoview 03/2011: dec uptake along mid anterior wall on stress imaging -> ? attenuation vs. ischemia, EF 65%;  c. Echo 04/2011: EF 55-60%, no RWMA, Gr 2 dd  . Anxiety   . Arthritis   . Asthma   . Bone cancer ([place])   . Cancer ([place])   . CHF (congestive heart failure) ([place])   . CHF (congestive heart failure) ([place])   . Depression   . Diabetes mellitus   . Dyspnea    with exertion  . Frequent urination   . GERD (gastroesophageal reflux disease)   . Headache   .  Hypertension   . 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 Obesity   . Pulmonary edema   . Pulmonary embolism ([place])    a. 2008 -> coumadin x 6 mos.  . Sleep apnea    does not use CPAP  . Urinary urgency     Family History  Problem Relation Age of Onset  . Emphysema Mother   . Arthritis Mother   . Heart failure Mother        alive @ 3  . Asthma Brother   . Heart disease Father        died @ 73's.  . Stroke Father   . Diabetes Sister   . Heart disease Paternal Grandfather   . Colon cancer Maternal Grandfather   . Breast cancer Maternal Aunt     Past Surgical  History:  Procedure Laterality Date  . ABDOMINAL HYSTERECTOMY  2005  . CARDIAC CATHETERIZATION     Normal  . CARDIAC CATHETERIZATION N/A 09/30/2014   Procedure: Left Heart Cath and Coronary Angiography;  Surgeon: Sherren Mocha, MD;  Location: [facility];  Service: Cardiovascular;  Laterality: N/A;  . LAPAROSCOPIC APPENDECTOMY N/A 06/03/2012   Procedure: APPENDECTOMY LAPAROSCOPIC;  Surgeon: Stark Klein, MD;  Location: [facility];  Service: General;  Laterality: N/A;  . Left knee surgery  2008  . LEG SURGERY    . TONSILLECTOMY    . TOTAL KNEE ARTHROPLASTY Left 08/22/2016   Procedure: TOTAL KNEE ARTHROPLASTY;  Surgeon: Meredith Pel, MD;  Location: [facility];  Service: Orthopedics;  Laterality: Left;  . TUBAL LIGATION  1989   Social History   Occupational History  . UNEMPLOYED Disabled   Social History Main Topics  . Smoking status: Former Smoker    Packs/day: 0.50    Years: 15.00    Types: Cigarettes    Quit date: 04/24/1985  . Smokeless tobacco: Never Used  . Alcohol use No  . Drug use: No  . Sexual activity: No

## 2016-09-11 LAB — CBC AND DIFFERENTIAL
HCT: 33 % — AB (ref 36–46)
Hemoglobin: 10.3 g/dL — AB (ref 12.0–16.0)
Platelets: 326 10*3/uL (ref 150–399)
WBC: 5.9 10^3/mL

## 2016-09-15 ENCOUNTER — Encounter: Payer: Self-pay | Admitting: Family

## 2016-09-15 ENCOUNTER — Non-Acute Institutional Stay (SKILLED_NURSING_FACILITY): Payer: Medicare Other | Admitting: Family

## 2016-09-15 DIAGNOSIS — Z9989 Dependence on other enabling machines and devices: Secondary | ICD-10-CM | POA: Diagnosis not present

## 2016-09-15 DIAGNOSIS — E669 Obesity, unspecified: Secondary | ICD-10-CM | POA: Diagnosis not present

## 2016-09-15 DIAGNOSIS — I5032 Chronic diastolic (congestive) heart failure: Secondary | ICD-10-CM | POA: Diagnosis not present

## 2016-09-15 DIAGNOSIS — G4733 Obstructive sleep apnea (adult) (pediatric): Secondary | ICD-10-CM | POA: Diagnosis not present

## 2016-09-15 DIAGNOSIS — R2681 Unsteadiness on feet: Secondary | ICD-10-CM

## 2016-09-15 DIAGNOSIS — J449 Chronic obstructive pulmonary disease, unspecified: Secondary | ICD-10-CM | POA: Diagnosis not present

## 2016-09-15 DIAGNOSIS — E1169 Type 2 diabetes mellitus with other specified complication: Secondary | ICD-10-CM

## 2016-09-15 DIAGNOSIS — F32A Depression, unspecified: Secondary | ICD-10-CM

## 2016-09-15 DIAGNOSIS — E785 Hyperlipidemia, unspecified: Secondary | ICD-10-CM | POA: Diagnosis not present

## 2016-09-15 DIAGNOSIS — I10 Essential (primary) hypertension: Secondary | ICD-10-CM | POA: Diagnosis not present

## 2016-09-15 DIAGNOSIS — F329 Major depressive disorder, single episode, unspecified: Secondary | ICD-10-CM

## 2016-09-17 NOTE — Progress Notes (Signed)
Location:  [place] Room Number: [phone] Place of Service:  SNF (31)  Provider: Marlowe Sax FNP-C   PCP: Velna Hatchet, MD Patient Care Team: Velna Hatchet, MD as PCP - General (Internal Medicine)  Extended Emergency Contact Information Primary Emergency Contact: White,Ebony Address: [address] [place], [place] [address] [place] Phone: [phone] Mobile Phone: [phone] Relation: Daughter Secondary Emergency Contact: Neal,April  [place] Phone: [phone] Relation: Daughter  Code Status: Full code  Goals of care:  Advanced Directive information Advanced Directives 09/15/2016  Does Patient Have a Medical Advance Directive? No  Would patient like information on creating a medical advance directive? -  Pre-existing out of facility DNR order (yellow form or pink MOST form) -     Allergies  Allergen Reactions  . Penicillins Swelling    Has patient had a PCN reaction causing immediate rash, facial/tongue/throat swelling, SOB or lightheadedness with hypotension: YES Has patient had a PCN reaction causing severe rash involving mucus membranes or skin necrosis: NO Has patient had a PCN reaction that required hospitalization NO Has patient had a PCN reaction occurring within the last 10 years: NO If all of the above answers are "NO", then may proceed with Cephalosporin use.  . Latex Rash  . Sulfa Antibiotics Diarrhea, Itching and Rash    Chief Complaint  Patient presents with  . Discharge Note    Discharge from [facility]    HPI:  56 y.o. female seen today at [facility] for discharge home.She was here for short term rehabilitation for post hospital admission from 08/22/2016-08/25/2016 with left knee OA post left total knee arthroplasty. She underwent STR at another facility for 5 days then was transferred to [facility] per her request. She  has a medical history of HTN, CHF, Asthma, Type 2 DM, OA, depression,anxiety among other conditions.She is seen in her room today.She denies any acute issues this visit.She states left knee pain under control with current pain regimen.She continues to exercise on CPM machine.she has had unremarkable stay here in rehab.She was seen by [facility] 09/04/2016 and has a follow up appointment in 2 weeks. WBAT.she has a completed Xarelto 09/08/2016 fr DVT prophylaxis.   She has worked well with PT/OT now stable for discharge home.She will be discharged home with Home health PT/OT to continue with ROM, Exercise, Gait stability and muscle strengthening. She will require  DME FWW to allow her to maintain current level of independence with ADL's.she will also need a shower chair to enable her to safely and independently perform her showers due to unsteady gait. Home health services will be arranged by facility social worker prior to discharge.she will discharge with her medication from the facility. Prescription medication will be written x 1 month then patient to follow up with PCP in 1-2 weeks.Facility staff report no new concerns.   Past Medical History:  Diagnosis Date  . Anginal pain ([place])    a. NL cath in 2008;  b. Myoview 03/2011: dec uptake along mid anterior wall on stress imaging -> ? attenuation vs. ischemia, EF 65%;  c. Echo 04/2011: EF 55-60%, no RWMA, Gr 2 dd  . Anxiety   . Arthritis   . Asthma   . Bone cancer ([place])   . Cancer ([place])   . CHF (congestive heart failure) ([place])   . CHF (congestive heart failure) (  [place])   . Depression   . Diabetes mellitus   . Dyspnea    with exertion  . Frequent urination   . GERD (gastroesophageal reflux disease)   . Headache   . Hypertension   . 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 Obesity   . Pulmonary edema   . Pulmonary embolism ([place])    a. 2008 -> coumadin x 6 mos.  . Sleep apnea    does not use CPAP  . Urinary urgency      Past Surgical History:  Procedure Laterality Date  . ABDOMINAL HYSTERECTOMY  2005  . CARDIAC CATHETERIZATION     Normal  . CARDIAC CATHETERIZATION N/A 09/30/2014   Procedure: Left Heart Cath and Coronary Angiography;  Surgeon: Sherren Mocha, MD;  Location: [place] CV LAB;  Service: Cardiovascular;  Laterality: N/A;  . LAPAROSCOPIC APPENDECTOMY N/A 06/03/2012   Procedure: APPENDECTOMY LAPAROSCOPIC;  Surgeon: Stark Klein, MD;  Location: [facility];  Service: General;  Laterality: N/A;  . Left knee surgery  2008  . LEG SURGERY    . TONSILLECTOMY    . TOTAL KNEE ARTHROPLASTY Left 08/22/2016   Procedure: TOTAL KNEE ARTHROPLASTY;  Surgeon: Meredith Pel, MD;  Location: [place];  Service: Orthopedics;  Laterality: Left;  . TUBAL LIGATION  1989      reports that she quit smoking about 31 years ago. Her smoking use included Cigarettes. She has a 7.50 pack-year smoking history. She has never used smokeless tobacco. She reports that she does not drink alcohol or use drugs. Social History   Social History  . Marital status: Legally Separated    Spouse name: N/A  . Number of children: N/A  . Years of education: N/A   Occupational History  . UNEMPLOYED Disabled   Social History Main Topics  . Smoking status: Former Smoker    Packs/day: 0.50    Years: 15.00    Types: Cigarettes    Quit date: 04/24/1985  . Smokeless tobacco: Never Used  . Alcohol use No  . Drug use: No  . Sexual activity: No   Other Topics Concern  . Not on file   Social History Narrative   Lives in [place] by herself.  Disabled.  Caffeine 1 cup avg daily.  3 kids, 6 grandkids.      Allergies  Allergen Reactions  . Penicillins Swelling    Has patient had a PCN reaction causing immediate rash, facial/tongue/throat swelling, SOB or lightheadedness with hypotension: YES Has patient had a PCN reaction causing severe rash involving mucus membranes or skin necrosis: NO Has patient had a PCN reaction that required  hospitalization NO Has patient had a PCN reaction occurring within the last 10 years: NO If all of the above answers are "NO", then may proceed with Cephalosporin use.  . Latex Rash  . Sulfa Antibiotics Diarrhea, Itching and Rash    Pertinent  Health Maintenance Due  Topic Date Due  . FOOT EXAM  10/15/1969  . OPHTHALMOLOGY EXAM  10/15/1969  . COLONOSCOPY  10/15/2009  . PAP SMEAR  04/05/2014  . INFLUENZA VACCINE  11/22/2016  . HEMOGLOBIN A1C  02/10/2017  . MAMMOGRAM  07/21/2018    Medications: Allergies as of 09/15/2016      Reactions   Penicillins Swelling   Has patient had a PCN reaction causing immediate rash, facial/tongue/throat swelling, SOB or lightheadedness with hypotension: YES Has patient had a PCN reaction causing severe rash involving mucus membranes  or skin necrosis: NO Has patient had a PCN reaction that required hospitalization NO Has patient had a PCN reaction occurring within the last 10 years: NO If all of the above answers are "NO", then may proceed with Cephalosporin use.   Latex Rash   Sulfa Antibiotics Diarrhea, Itching, Rash      Medication List       Accurate as of 09/15/16 11:59 PM. Always use your most recent med list.          albuterol 108 (90 Base) MCG/ACT inhaler Commonly known as:  PROVENTIL HFA;VENTOLIN HFA Inhale 2 puffs into the lungs every 4 (four) hours as needed for wheezing or shortness of breath.   atorvastatin 40 MG tablet Commonly known as:  LIPITOR Take 40 mg by mouth at bedtime.   benzonatate 200 MG capsule Commonly known as:  TESSALON Take 1 capsule (200 mg total) by mouth 3 (three) times daily as needed for cough (cough unrelieved by robitussin).   budesonide-formoterol 160-4.5 MCG/ACT inhaler Commonly known as:  SYMBICORT Inhale 2 puffs into the lungs 2 (two) times daily.   carvedilol 10 MG 24 hr capsule Commonly known as:  COREG CR Take 10 mg by mouth daily.   cyclobenzaprine 5 MG tablet Commonly known as:   FLEXERIL Take 5 mg by mouth 2 (two) times daily.   furosemide 40 MG tablet Commonly known as:  LASIX Take 40 mg by mouth daily.   gabapentin 300 MG capsule Commonly known as:  NEURONTIN Take 2 capsules (600 mg total) by mouth 3 (three) times daily.   ipratropium-albuterol 0.5-2.5 (3) MG/3ML Soln Commonly known as:  DUONEB Take 3 mLs by nebulization 3 (three) times daily. Use 3 times daily for 4 days, then 4-6 hours as needed. Stop date 09/04/16   isosorbide dinitrate 20 MG tablet Commonly known as:  ISORDIL Take 20 mg by mouth 2 (two) times daily.   KLOR-CON M10 10 MEQ tablet Generic drug:  potassium chloride Take 10 mEq by mouth 2 (two) times daily.   KOMBIGLYZE XR 5-500 MG Tb24 Generic drug:  Saxagliptin-Metformin Take 1 tablet by mouth every morning.   lisinopril 10 MG tablet Commonly known as:  PRINIVIL,ZESTRIL Take 2 tablets by mouth daily. Take 2 tabs to = 20 mg qd   loratadine 10 MG tablet Commonly known as:  CLARITIN Take 1 tablet (10 mg total) by mouth daily.   nitroGLYCERIN 0.4 MG SL tablet Commonly known as:  NITROSTAT Place 0.4 mg under the tongue every 5 (five) minutes as needed. For chest pain.   oxyCODONE 5 MG immediate release tablet Commonly known as:  Oxy IR/ROXICODONE Take 1-2 tablets (5-10 mg total) by mouth every 3 (three) hours as needed for breakthrough pain.   pantoprazole 40 MG tablet Commonly known as:  PROTONIX Take 1 tablet (40 mg total) by mouth daily.   sertraline 100 MG tablet Commonly known as:  ZOLOFT Take 100 mg by mouth daily.       Review of Systems  Constitutional: Negative for activity change, appetite change, chills, fatigue and fever.  HENT: Negative for congestion, rhinorrhea, sinus pain, sinus pressure, sneezing and sore throat.   Eyes: Negative.   Respiratory: Negative for cough, chest tightness, shortness of breath and wheezing.   Cardiovascular: Positive for leg swelling. Negative for chest pain and palpitations.    Gastrointestinal: Negative for abdominal distention, abdominal pain, constipation, diarrhea, nausea and vomiting.  Endocrine: Negative.   Genitourinary: Negative for dysuria, flank pain, frequency and urgency.  Musculoskeletal: Positive for gait problem.       Left knee pain under control with current regimen   Skin: Negative for color change, pallor and rash.  Neurological: Negative for dizziness, seizures, syncope, light-headedness and headaches.  Hematological: Does not bruise/bleed easily.  Psychiatric/Behavioral: Negative for agitation, confusion, hallucinations and sleep disturbance. The patient is not nervous/anxious.     Vitals:   09/15/16 1007  BP: 130/83  Pulse: 88  Resp: 18  Temp: 98.2 F (36.8 C)  SpO2: 93%  Weight: 232 lb (105.2 kg)  Height: 4\' 11"  (1.499 m)   Body mass index is 46.86 kg/m. Physical Exam  Constitutional: She is oriented to person, place, and time. She appears well-developed and well-nourished. No distress.  HENT:  Head: Normocephalic.  Mouth/Throat: Oropharynx is clear and moist. No oropharyngeal exudate.  Eyes: Conjunctivae and EOM are normal. Pupils are equal, round, and reactive to light. Right eye exhibits no discharge. Left eye exhibits no discharge. No scleral icterus.  Neck: Normal range of motion. No JVD present. No thyromegaly present.  Cardiovascular: Normal rate, regular rhythm, normal heart sounds and intact distal pulses.  Exam reveals no gallop and no friction rub.   No murmur heard. Pulmonary/Chest: Effort normal and breath sounds normal. No respiratory distress. She has no wheezes. She has no rales.  Abdominal: Soft. Bowel sounds are normal. She exhibits no distension. There is no tenderness. There is no rebound and no guarding.  Genitourinary:  Genitourinary Comments: Continent   Musculoskeletal: She exhibits no tenderness or deformity.  Unsteady gait. Left lower leg trace edema noted though much improvement per patient.    Lymphadenopathy:    She has no cervical adenopathy.  Neurological: She is oriented to person, place, and time.  Skin: Skin is warm and dry. No rash noted. No erythema. No pallor.  Left knee surgical incision with steri strips dry, clean and intact.   Psychiatric: She has a normal mood and affect.    Labs reviewed: Basic Metabolic Panel:  Recent Labs  05/21/16 1020  05/26/16 0709 08/11/16 1408 08/23/16 0546 09/06/16  NA  --   < > 137 142 138 144  K  --   < > 3.6 3.8 3.6 3.9  CL  --   < > 102 105 104  --   CO2  --   < > 27 29 28   --   GLUCOSE  --   < > 202* 132* 148*  --   BUN  --   < > 18 10 6 10   CREATININE 0.84  < > 0.95 0.81 0.76 0.8  CALCIUM  --   < > 8.5* 9.6 8.4*  --   MG 1.9  --   --   --   --   --   < > = values in this interval not displayed. Liver Function Tests:  Recent Labs  05/22/16 0913 05/23/16 0629 09/06/16  AST 28 22 15   ALT 32 26 19  ALKPHOS 62 56 69  BILITOT 0.7 0.6  --   PROT 6.4* 6.1*  --   ALBUMIN 3.3* 3.2*  --     Recent Labs  05/22/16 0913  LIPASE 27   CBC:  Recent Labs  05/21/16 0800  05/26/16 0709 08/11/16 1408 08/23/16 0546 09/06/16 09/11/16  WBC 6.0  < > 9.9 7.3 9.2 6.1 5.9  NEUTROABS 1.7  --   --   --   --   --   --   HGB 11.9*  < >  12.2 13.8 11.1* 10.0* 10.3*  HCT 37.0  < > 38.0 43.4 35.2* 32* 33*  MCV 86.7  < > 86.2 87.7 87.6  --   --   PLT 185  < > 191 234 198 344 326  < > = values in this interval not displayed. Cardiac Enzymes:  Recent Labs  05/21/16 1020 05/21/16 1756 05/21/16 2331  TROPONINI 0.05* 0.05* 0.05*    Recent Labs  08/22/16 1625 08/22/16 2131 08/23/16 0711  GLUCAP 119* 160* 153*   Assessment/Plan:   1. Unsteady gait Has worked well with PT/ OT. Will discharge home PT/OT to continue with ROM, Exercise, Gait stability and muscle strengthening. She will require  DME FWW to allow her to maintain current level of independence with ADL's.she will also need a shower chair to enable her to safely  and independently perform her showers due to unsteady gait. Fall and safety precautions.  2. Essential hypertension B/p stable.Continue on Lisinopril, isosorbide, coreg and furosemide. BMP in 1-2 weeks with PCP.   3. Chronic diastolic congestive heart failure Weight stable. Exam findings negative for cough, wheezing, shortness of breath or rales. Continue on  Lisinopril, isosorbide, coreg and furosemide.continue on fluid restrictions 1.5 L/day. Continue to monitor weight.   4. OSA on CPAP Has own C-Pap. Continue to follow up with Pulmonary as needed.   5. Chronic obstructive pulmonary disease, unspecified COPD type  Breathing is stable.Continue on Symbicort, Albuterol, Duoneb,claritin and Tessalon capsule. Continue to monitor.   6. Diabetes mellitus type 2 in obese  Continue on Kombiglyze XR. On Atorvastatin and ACE inhitor.Consider adding ASA for prophylaxis.annual eye and foot exam will be deferred to PCP was here for short term rehab. Continue to monitor Hgb A1C.    7. Dyslipidemia Continue on heart healthy diet and atorvastatin. Monitor lipid panel periodically.   8. Depression Stable. Continue on sertraline 100 mg daily. Monitor for mood changes.   Patient is being discharged with the following home health services:   -PT/OT for ROM, exercise, gait stability and muscle strengthening  Patient is being discharged with the following durable medical equipment:    - FWW  to allow her to maintain current level of independence.  -  shower chair to enable her to safely and independently perform her showers due to unsteady gait.  Patient has been advised to f/u with their PCP in 1-2 weeks to for a transitions of care visit.Social services at their facility was responsible for arranging this appointment.  Pt was provided with adequate prescriptions of noncontrolled medications to reach the scheduled appointment.For controlled substances, a limited supply was provided as appropriate for the  individual patient. If the pt normally receives these medications from a pain clinic or has a contract with another physician, these medications should be received from that clinic or physician only).    Future labs/tests needed:  CBC, BMP in 1-2 weeks PCP

## 2016-09-20 ENCOUNTER — Encounter (INDEPENDENT_AMBULATORY_CARE_PROVIDER_SITE_OTHER): Payer: Self-pay | Admitting: Orthopedic Surgery

## 2016-09-20 ENCOUNTER — Ambulatory Visit (INDEPENDENT_AMBULATORY_CARE_PROVIDER_SITE_OTHER): Payer: Medicare Other | Admitting: Orthopedic Surgery

## 2016-09-20 DIAGNOSIS — Z96652 Presence of left artificial knee joint: Secondary | ICD-10-CM | POA: Insufficient documentation

## 2016-09-20 NOTE — Progress Notes (Signed)
Post-Op Visit Note   Patient: [name]           Date of Birth: Sep 18, 1960           MRN: [id number] Visit Date: 09/20/2016 PCP: Velna Hatchet, MD   Assessment & Plan:  Chief Complaint:  Chief Complaint  Patient presents with  . Left Knee - Routine Post Op   Visit Diagnoses:  1. Presence of left artificial knee joint     Plan: [name] is a 56 year old patient who is now a month out left total knee replacement on exam she is lacking about 20 of flexion to get to 90.  Taking Flexeril and oxycodone.  Plan is for her to work diligently on achieving 90 of flexion by her next office visit which would be in 2 weeks.  May need to consider manipulation under anesthesia if she can achieve that range of motion.  We'll see her back in 6 weeks for recheck on that problem.  Follow-Up Instructions: Return in about 2 weeks (around 10/04/2016).   Orders:  No orders of the defined types were placed in this encounter.  No orders of the defined types were placed in this encounter.   Imaging: No results found.  PMFS History: Patient Active Problem List   Diagnosis Date Noted  . Presence of left artificial knee joint 09/20/2016  . Arthritis of knee 08/22/2016  . Cervical radiculopathy 07/26/2016  . Cervical disc disorder with radiculopathy 07/26/2016  . Right arm pain 06/29/2016  . Cervicalgia 06/29/2016  . Primary osteoarthritis of left knee 06/29/2016  . Hypersomnia with sleep apnea 11/18/2015  . Lethargy 11/18/2015  . Chest pain 09/30/2014  . Abnormal cardiac function test 09/29/2014  . Chest pain with moderate risk for cardiac etiology 09/28/2014  . HTN (hypertension) 09/28/2014  . Hypokalemia   . Paresthesia 06/18/2014  . Numbness and tingling of left arm and leg   . Unstable angina ([place]) 05/24/2014  . OSA on CPAP 05/24/2014  . Diabetes mellitus type 2 in obese ([place]) 10/29/2012  . S/P laparoscopic appendectomy 06/04/2012  . Diastolic CHF ([place])  09/32/6712  . Mediastinal mass 01/09/2012  . SOB (shortness of breath) 06/20/2011  . Mediastinal abnormality 06/20/2011  . Dyslipidemia 12/23/2009  . GLAUCOMA 12/23/2009  . ARTHRITIS 12/23/2009  . 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 Morbid obesity-BMI 45 09/13/2006  . ANXIETY STATE NOS 09/13/2006  . DISORDER, DEPRESSIVE NEC 09/13/2006  . CARPAL TUNNEL SYNDROME, MILD 09/13/2006  . Unspecified essential hypertension 09/13/2006  . IBS 09/13/2006  . DEGENERATION, LUMBAR/LUMBOSACRAL DISC 09/13/2006  . SYMPTOM, SWELLING/MASS/LUMP IN CHEST 09/13/2006  . PULMONARY EMBOLISM, HX OF 09/13/2006   Past Medical History:  Diagnosis Date  . Anginal pain ([place])    a. NL cath in 2008;  b. Myoview 03/2011: dec uptake along mid anterior wall on stress imaging -> ? attenuation vs. ischemia, EF 65%;  c. Echo 04/2011: EF 55-60%, no RWMA, Gr 2 dd  . Anxiety   . Arthritis   . Asthma   . Bone cancer ([place])   . Cancer ([place])   . CHF (congestive heart failure) ([place])   . CHF (congestive heart failure) ([place])   . Depression   . Diabetes mellitus   . Dyspnea    with exertion  . Frequent urination   . GERD (gastroesophageal reflux disease)   . Headache   . Hypertension   . 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 Obesity   .  Pulmonary edema   . Pulmonary embolism ([place])    a. 2008 -> coumadin x 6 mos.  . Sleep apnea    does not use CPAP  . Urinary urgency     Family History  Problem Relation Age of Onset  . Emphysema Mother   . Arthritis Mother   . Heart failure Mother        alive @ 19  . Asthma Brother   . Heart disease Father        died @ 60's.  . Stroke Father   . Diabetes Sister   . Heart disease Paternal Grandfather   . Colon cancer Maternal Grandfather   . Breast cancer Maternal Aunt     Past Surgical History:  Procedure Laterality Date  . ABDOMINAL HYSTERECTOMY  2005  . CARDIAC CATHETERIZATION     Normal  . CARDIAC CATHETERIZATION N/A 09/30/2014   Procedure: Left Heart  Cath and Coronary Angiography;  Surgeon: Sherren Mocha, MD;  Location: [place] CV LAB;  Service: Cardiovascular;  Laterality: N/A;  . LAPAROSCOPIC APPENDECTOMY N/A 06/03/2012   Procedure: APPENDECTOMY LAPAROSCOPIC;  Surgeon: Stark Klein, MD;  Location: [facility];  Service: General;  Laterality: N/A;  . Left knee surgery  2008  . LEG SURGERY    . TONSILLECTOMY    . TOTAL KNEE ARTHROPLASTY Left 08/22/2016   Procedure: TOTAL KNEE ARTHROPLASTY;  Surgeon: Meredith Pel, MD;  Location: [place] [place];  Service: Orthopedics;  Laterality: Left;  . TUBAL LIGATION  1989   Social History   Occupational History  . UNEMPLOYED Disabled   Social History Main Topics  . Smoking status: Former Smoker    Packs/day: 0.50    Years: 15.00    Types: Cigarettes    Quit date: 04/24/1985  . Smokeless tobacco: Never Used  . Alcohol use No  . Drug use: No  . Sexual activity: No

## 2016-09-25 ENCOUNTER — Encounter (HOSPITAL_COMMUNITY): Payer: Self-pay | Admitting: Orthopedic Surgery

## 2016-09-25 NOTE — Addendum Note (Signed)
Addendum  created 09/25/16 1438 by Oleta Mouse, MD   Sign clinical note

## 2016-09-26 ENCOUNTER — Telehealth (INDEPENDENT_AMBULATORY_CARE_PROVIDER_SITE_OTHER): Payer: Self-pay

## 2016-09-26 NOTE — Telephone Encounter (Signed)
Patient would like to know when can she drive again?  CB# is [phone].

## 2016-09-26 NOTE — Telephone Encounter (Signed)
Please advise 

## 2016-09-27 NOTE — Telephone Encounter (Signed)
IC no answer. LM.

## 2016-09-27 NOTE — Telephone Encounter (Signed)
Her call - needs good rom and quad control and no pain meds pls clal htx

## 2016-09-30 ENCOUNTER — Other Ambulatory Visit: Payer: Self-pay

## 2016-09-30 ENCOUNTER — Emergency Department (HOSPITAL_COMMUNITY): Payer: Medicare Other

## 2016-09-30 ENCOUNTER — Encounter (HOSPITAL_COMMUNITY): Payer: Self-pay

## 2016-09-30 ENCOUNTER — Emergency Department (HOSPITAL_COMMUNITY)
Admission: EM | Admit: 2016-09-30 | Discharge: 2016-09-30 | Disposition: A | Discharge status: Home / Self Care | Payer: Medicare Other | Attending: Emergency Medicine | Admitting: Emergency Medicine

## 2016-09-30 ENCOUNTER — Emergency Department (HOSPITAL_BASED_OUTPATIENT_CLINIC_OR_DEPARTMENT_OTHER)
Admit: 2016-09-30 | Discharge: 2016-09-30 | Disposition: A | Discharge status: Home / Self Care | Payer: Medicare Other | Attending: Emergency Medicine | Admitting: Emergency Medicine

## 2016-09-30 DIAGNOSIS — Z91040 Latex allergy status: Secondary | ICD-10-CM | POA: Diagnosis not present

## 2016-09-30 DIAGNOSIS — M79605 Pain in left leg: Secondary | ICD-10-CM

## 2016-09-30 DIAGNOSIS — J45909 Unspecified asthma, uncomplicated: Secondary | ICD-10-CM | POA: Diagnosis not present

## 2016-09-30 DIAGNOSIS — Z79899 Other long term (current) drug therapy: Secondary | ICD-10-CM | POA: Diagnosis not present

## 2016-09-30 DIAGNOSIS — Z87891 Personal history of nicotine dependence: Secondary | ICD-10-CM | POA: Diagnosis not present

## 2016-09-30 DIAGNOSIS — M79662 Pain in left lower leg: Secondary | ICD-10-CM | POA: Insufficient documentation

## 2016-09-30 DIAGNOSIS — R0602 Shortness of breath: Secondary | ICD-10-CM | POA: Diagnosis not present

## 2016-09-30 DIAGNOSIS — E119 Type 2 diabetes mellitus without complications: Secondary | ICD-10-CM | POA: Diagnosis not present

## 2016-09-30 DIAGNOSIS — Z7984 Long term (current) use of oral hypoglycemic drugs: Secondary | ICD-10-CM | POA: Insufficient documentation

## 2016-09-30 DIAGNOSIS — Z96652 Presence of left artificial knee joint: Secondary | ICD-10-CM | POA: Diagnosis not present

## 2016-09-30 DIAGNOSIS — I509 Heart failure, unspecified: Secondary | ICD-10-CM | POA: Diagnosis not present

## 2016-09-30 DIAGNOSIS — M7989 Other specified soft tissue disorders: Secondary | ICD-10-CM | POA: Diagnosis not present

## 2016-09-30 DIAGNOSIS — I110 Hypertensive heart disease with heart failure: Secondary | ICD-10-CM | POA: Insufficient documentation

## 2016-09-30 DIAGNOSIS — Z85830 Personal history of malignant neoplasm of bone: Secondary | ICD-10-CM | POA: Insufficient documentation

## 2016-09-30 LAB — BASIC METABOLIC PANEL
Anion gap: 8 (ref 5–15)
BUN: 12 mg/dL (ref 6–20)
CO2: 29 mmol/L (ref 22–32)
Calcium: 9.2 mg/dL (ref 8.9–10.3)
Chloride: 103 mmol/L (ref 101–111)
Creatinine, Ser: 0.94 mg/dL (ref 0.44–1.00)
GFR calc Af Amer: >60 mL/min (ref >60)
GFR calc non Af Amer: >60 mL/min (ref >60)
Glucose, Bld: 203 mg/dL — ABNORMAL HIGH (ref 65–99)
Potassium: 3.4 mmol/L — ABNORMAL LOW (ref 3.5–5.1)
Sodium: 140 mmol/L (ref 135–145)

## 2016-09-30 LAB — TROPONIN I
Troponin I: 0.13 ng/mL (ref <0.03)
Troponin I: 0.12 ng/mL (ref <0.03)

## 2016-09-30 LAB — CBC
HCT: 35.4 % — ABNORMAL LOW (ref 36.0–46.0)
Hemoglobin: 11.3 g/dL — ABNORMAL LOW (ref 12.0–15.0)
MCH: 27.7 pg (ref 26.0–34.0)
MCHC: 31.9 g/dL (ref 30.0–36.0)
MCV: 86.8 fL (ref 78.0–100.0)
Platelets: 212 10*3/uL (ref 150–400)
RBC: 4.08 MIL/uL (ref 3.87–5.11)
RDW: 13.4 % (ref 11.5–15.5)
WBC: 6 10*3/uL (ref 4.0–10.5)

## 2016-09-30 MED ORDER — MORPHINE SULFATE (PF) 2 MG/ML IV SOLN
4.0000 mg | Freq: Once | INTRAVENOUS | Status: AC
Start: 2016-09-30 — End: 2016-09-30
  Administered 2016-09-30: 2 mg via INTRAVENOUS
  Filled 2016-09-30: qty 2

## 2016-09-30 MED ORDER — IOPAMIDOL (ISOVUE-370) INJECTION 76%
INTRAVENOUS | Status: AC
  Administered 2016-09-30: 15:00:00
  Administered 2016-09-30: 100 mL via INTRAVENOUS
  Filled 2016-09-30: qty 100

## 2016-09-30 NOTE — ED Triage Notes (Signed)
She c/o shortness of breath and left leg aching and swelling. She mentions having total left knee replacement this May.

## 2016-09-30 NOTE — ED Notes (Signed)
Venous doppler being done at bedside

## 2016-09-30 NOTE — ED Notes (Signed)
Vascular at bedside

## 2016-09-30 NOTE — ED Provider Notes (Addendum)
[facility] DEPT Provider Note   CSN: [id number] Arrival date & time: 09/30/16  1226     History   Chief Complaint Chief Complaint  Patient presents with  . Leg Pain    HPI [name] is a 56 y.o. female.  HPI Patient presents emergency department complains of left lower leg pain and swelling as well as the shortness of breath today.  She does have a history of pulmonary embolism.  She is no longer on anticoagulation.  She underwent left knee surgery approximately one month ago.  No fevers or chills.  No increasing pain in the left knee joint itself.  No productive cough.  No fevers or chills.  Denies abdominal pain.  No back pain.  No urinary complaints.  No weakness of her arms or legs   Past Medical History:  Diagnosis Date  . Anginal pain ([place])    a. NL cath in 2008;  b. Myoview 03/2011: dec uptake along mid anterior wall on stress imaging -> ? attenuation vs. ischemia, EF 65%;  c. Echo 04/2011: EF 55-60%, no RWMA, Gr 2 dd  . Anxiety   . Arthritis   . Asthma   . Bone cancer ([place])   . Cancer ([place])   . CHF (congestive heart failure) ([place])   . CHF (congestive heart failure) ([place])   . Depression   . Diabetes mellitus   . Dyspnea    with exertion  . Frequent urination   . GERD (gastroesophageal reflux disease)   . Headache   . Hypertension   . 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 Obesity   . Pulmonary edema   . Pulmonary embolism ([place])    a. 2008 -> coumadin x 6 mos.  . Sleep apnea    does not use CPAP  . Urinary urgency     Patient Active Problem List   Diagnosis Date Noted  . Presence of left artificial knee joint 09/20/2016  . Arthritis of knee 08/22/2016  . Cervical radiculopathy 07/26/2016  . Cervical disc disorder with radiculopathy 07/26/2016  . Right arm pain 06/29/2016  . Cervicalgia 06/29/2016  . Primary osteoarthritis of left knee 06/29/2016  . Hypersomnia with sleep apnea 11/18/2015  . Lethargy 11/18/2015  . Chest  pain 09/30/2014  . Abnormal cardiac function test 09/29/2014  . Chest pain with moderate risk for cardiac etiology 09/28/2014  . HTN (hypertension) 09/28/2014  . Hypokalemia   . Paresthesia 06/18/2014  . Numbness and tingling of left arm and leg   . Unstable angina ([place]) 05/24/2014  . OSA on CPAP 05/24/2014  . Diabetes mellitus type 2 in obese ([place]) 10/29/2012  . S/P laparoscopic appendectomy 06/04/2012  . Diastolic CHF ([place]) 02/07/5101  . Mediastinal mass 01/09/2012  . SOB (shortness of breath) 06/20/2011  . Mediastinal abnormality 06/20/2011  . Dyslipidemia 12/23/2009  . GLAUCOMA 12/23/2009  . ARTHRITIS 12/23/2009  . 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 Morbid obesity-BMI 45 09/13/2006  . ANXIETY STATE NOS 09/13/2006  . DISORDER, DEPRESSIVE NEC 09/13/2006  . CARPAL TUNNEL SYNDROME, MILD 09/13/2006  . Unspecified essential hypertension 09/13/2006  . IBS 09/13/2006  . DEGENERATION, LUMBAR/LUMBOSACRAL DISC 09/13/2006  . SYMPTOM, SWELLING/MASS/LUMP IN CHEST 09/13/2006  . PULMONARY EMBOLISM, HX OF 09/13/2006    Past Surgical History:  Procedure Laterality Date  . ABDOMINAL HYSTERECTOMY  2005  . CARDIAC CATHETERIZATION     Normal  . CARDIAC CATHETERIZATION N/A 09/30/2014   Procedure: Left Heart Cath and Coronary Angiography;  Surgeon: Sherren Mocha, MD;  Location: [facility];  Service: Cardiovascular;  Laterality: N/A;  . LAPAROSCOPIC APPENDECTOMY N/A 06/03/2012   Procedure: APPENDECTOMY LAPAROSCOPIC;  Surgeon: Stark Klein, MD;  Location: [facility];  Service: General;  Laterality: N/A;  . Left knee surgery  2008  . LEG SURGERY    . TONSILLECTOMY    . TOTAL KNEE ARTHROPLASTY Left 08/22/2016   Procedure: TOTAL KNEE ARTHROPLASTY;  Surgeon: Meredith Pel, MD;  Location: [facility];  Service: Orthopedics;  Laterality: Left;  . TUBAL LIGATION  1989    OB History    No data available       Home Medications    Prior to Admission medications   Medication Sig Start Date End Date  Taking? Authorizing Provider  albuterol (PROVENTIL HFA;VENTOLIN HFA) 108 (90 Base) MCG/ACT inhaler Inhale 2 puffs into the lungs every 4 (four) hours as needed for wheezing or shortness of breath.   Yes [provider]  atorvastatin (LIPITOR) 40 MG tablet Take 40 mg by mouth at bedtime.    Yes [provider]  budesonide-formoterol (SYMBICORT) 160-4.5 MCG/ACT inhaler Inhale 2 puffs into the lungs 2 (two) times daily. 05/28/16  Yes Eugenie Filler, MD  carvedilol (COREG CR) 10 MG 24 hr capsule Take 10 mg by mouth daily.   Yes [provider]  cyclobenzaprine (FLEXERIL) 5 MG tablet Take 5 mg by mouth 2 (two) times daily.  12/16/15  Yes [provider]  furosemide (LASIX) 40 MG tablet Take 40 mg by mouth daily.   Yes Rai, Ripudeep K, MD  gabapentin (NEURONTIN) 300 MG capsule Take 2 capsules (600 mg total) by mouth 3 (three) times daily. 05/27/14  Yes Reyne Dumas, MD  ipratropium-albuterol (DUONEB) 0.5-2.5 (3) MG/3ML SOLN Take 3 mLs by nebulization 3 (three) times daily. Use 3 times daily for 4 days, then 4-6 hours as needed. Stop date 09/04/16   Yes [provider]  isosorbide dinitrate (ISORDIL) 20 MG tablet Take 20 mg by mouth 2 (two) times daily.    Yes [provider]  KLOR-CON M10 10 MEQ tablet Take 10 mEq by mouth 2 (two) times daily.  09/23/13  Yes [provider]  KOMBIGLYZE XR 5-500 MG TB24 Take 1 tablet by mouth every morning.  10/11/15  Yes [provider]  lisinopril (PRINIVIL,ZESTRIL) 10 MG tablet Take 10 mg by mouth 2 (two) times daily. Take 2 tabs to = 20 mg qd 09/21/14  Yes [provider]  loratadine (CLARITIN) 10 MG tablet Take 1 tablet (10 mg total) by mouth daily. 05/28/16  Yes Eugenie Filler, MD  nitroGLYCERIN (NITROSTAT) 0.4 MG SL tablet Place 0.4 mg under the tongue every 5 (five) minutes as needed. For chest pain.   Yes [provider]  oxyCODONE (OXY IR/ROXICODONE) 5 MG immediate release tablet  Take 1-2 tablets (5-10 mg total) by mouth every 3 (three) hours as needed for breakthrough pain. 08/25/16  Yes Meredith Pel, MD  pantoprazole (PROTONIX) 40 MG tablet Take 1 tablet (40 mg total) by mouth daily. 05/27/14  Yes Reyne Dumas, MD  sertraline (ZOLOFT) 100 MG tablet Take 100 mg by mouth daily.    Yes [provider]  benzonatate (TESSALON) 200 MG capsule Take 1 capsule (200 mg total) by mouth 3 (three) times daily as needed for cough (cough unrelieved by robitussin). Patient not taking: Reported on 09/30/2016 05/28/16   Eugenie Filler, MD    Family History Family History  Problem Relation  Age of Onset  . Emphysema Mother   . Arthritis Mother   . Heart failure Mother        alive @ 36  . Asthma Brother   . Heart disease Father        died @ 39's.  . Stroke Father   . Diabetes Sister   . Heart disease Paternal Grandfather   . Colon cancer Maternal Grandfather   . Breast cancer Maternal Aunt     Social History Social History  Substance Use Topics  . Smoking status: Former Smoker    Packs/day: 0.50    Years: 15.00    Types: Cigarettes    Quit date: 04/24/1985  . Smokeless tobacco: Never Used  . Alcohol use No     Allergies   Penicillins; Latex; and Sulfa antibiotics   Review of Systems Review of Systems  All other systems reviewed and are negative.    Physical Exam Updated Vital Signs BP 131/75 (BP Location: Right Arm)   Pulse 80   Temp 98.3 F (36.8 C) (Oral)   Resp 18   SpO2 98%   Physical Exam  Constitutional: She is oriented to person, place, and time. She appears well-developed and well-nourished. No distress.  HENT:  Head: Normocephalic and atraumatic.  Eyes: EOM are normal.  Neck: Normal range of motion.  Cardiovascular: Normal rate, regular rhythm and normal heart sounds.   Pulmonary/Chest: Effort normal and breath sounds normal.  Abdominal: Soft. She exhibits no distension. There is no tenderness.  Musculoskeletal:    Well-healed surgical scar on the left knee.  No surrounding signs of infection.  No obvious warmth or large effusion from the left knee.  Normal pulses in left foot.  Full range of motion of left knee, left hip, left ankle.  Mild swelling of left lower extremity as compared to the right  Neurological: She is alert and oriented to person, place, and time.  Skin: Skin is warm and dry.  Psychiatric: She has a normal mood and affect. Judgment normal.  Nursing note and vitals reviewed.    ED Treatments / Results  Labs (all labs ordered are listed, but only abnormal results are displayed) Labs Reviewed  CBC - Abnormal; Notable for the following:       Result Value   Hemoglobin 11.3 (*)    HCT 35.4 (*)    All other components within normal limits  BASIC METABOLIC PANEL - Abnormal; Notable for the following:    Potassium 3.4 (*)    Glucose, Bld 203 (*)    All other components within normal limits  TROPONIN I - Abnormal; Notable for the following:    Troponin I 0.13 (*)    All other components within normal limits  TROPONIN I    EKG  ECG interpretation  Date: 09/30/2016  Rate: 78  Rhythm: normal sinus rhythm  QRS Axis: normal  Intervals: normal  ST/T Wave abnormalities: nonspecific ST and T wave changes  Conduction Disutrbances: none  Narrative Interpretation:   Old EKG Reviewed: No significant changes noted       Radiology Dg Chest 2 View  Result Date: 09/30/2016 CLINICAL DATA:  Short of breath EXAM: CHEST  2 VIEW COMPARISON:  08/11/2016 FINDINGS: Upper normal heart size. Bibasilar atelectasis. Upper lungs clear. No pneumothorax. No pleural effusion. IMPRESSION: Bibasilar atelectasis. Electronically Signed   By: Marybelle Killings M.D.   On: 09/30/2016 13:39   Ct Angio Chest Pe W Or Wo Contrast  Result Date: 09/30/2016 CLINICAL DATA:  Short of breath EXAM: CT ANGIOGRAPHY CHEST WITH CONTRAST TECHNIQUE: Multidetector CT imaging of the chest was performed using the standard  protocol during bolus administration of intravenous contrast. Multiplanar CT image reconstructions and MIPs were obtained to evaluate the vascular anatomy. CONTRAST:  100 cc Isovue 370 COMPARISON:  05/21/2016 FINDINGS: Cardiovascular: There are no filling defects in the pulmonary arterial tree to suggest acute pulmonary thromboembolism. There is no obvious evidence of aortic dissection or aneurysm. Great vessels are grossly patent. Mediastinum/Nodes: There is a 2.5 x 1.3 cm soft tissue mass in the anterior mediastinum. See image 23 of series 4. This is not significantly changed dating back to 2014. A smaller similar tiny soft tissue mass is seen just inferior and to the left of the larger mass measuring 10 mm. This was much larger on the prior study from 2014. There is no new abnormal adenopathy. The right lobe of the thyroid gland remains enlarged. Lungs/Pleura: No pneumothorax.  No pleural effusion Lungs are under aerated with dependent atelectasis. Tiny 3 mm nodule at the lateral basal segment of the left lower lobe on image 68. Upper Abdomen: Left lobe of the liver remains prominent. This is chronic. Musculoskeletal: No vertebral compression deformity. Review of the MIP images confirms the above findings. IMPRESSION: No evidence of acute pulmonary thromboembolism Adenopathy in the anterior mediastinum is improved compared with 2014 supporting benign etiology. Right lobe of the thyroid gland remains enlarged. Electronically Signed   By: Marybelle Killings M.D.   On: 09/30/2016 14:54    Procedures Procedures (including critical care time)  Medications Ordered in ED Medications  morphine 2 MG/ML injection 4 mg (2 mg Intravenous Given 09/30/16 1317)  iopamidol (ISOVUE-370) 76 % injection (  Contrast Given 09/30/16 1510)     Initial Impression / Assessment and Plan / ED Course  I have reviewed the triage vital signs and the nursing notes.  Pertinent labs & imaging results that were available during my care of  the patient were reviewed by me and considered in my medical decision making (see chart for details).     Venous duplex of left lower extremity negative for DVT.  CT scan of the chest negative for PE or other acute abnormality.  Patient feels better this time.  With elevation here in emergency department she seems to have some improvement in the swelling of the left lower extremity.  No signs of infection around the left knee.    Pt with nonspecific troponin of 0.13. Has been elevated before in the past. Renal function normal. No CP now, reports now transient CP earlier. Heart cath in 2016 demonstrates normal coronary arteries. Repeat troponin at this time pending. ecg without changes. If troponin the same I do not think she needs admission to hospital  Care to Dr Ralene Bathe for follow up  Final Clinical Impressions(s) / ED Diagnoses   Final diagnoses:  SOB (shortness of breath)  Left leg pain    New Prescriptions New Prescriptions   No medications on file     Jola Schmidt, MD 09/30/16 [place]    Jola Schmidt, MD 09/30/16 1630

## 2016-09-30 NOTE — Progress Notes (Signed)
*  PRELIMINARY RESULTS* Vascular Ultrasound Left lower extremity venous duplex has been completed.  Preliminary findings: No evidence of deep vein thrombosis or baker's cyst in the left lower extremity.   [name] 09/30/2016, 2:37 PM

## 2016-10-04 ENCOUNTER — Encounter (INDEPENDENT_AMBULATORY_CARE_PROVIDER_SITE_OTHER): Payer: Self-pay | Admitting: Orthopedic Surgery

## 2016-10-04 ENCOUNTER — Telehealth (INDEPENDENT_AMBULATORY_CARE_PROVIDER_SITE_OTHER): Payer: Self-pay | Admitting: Orthopedic Surgery

## 2016-10-04 ENCOUNTER — Ambulatory Visit (INDEPENDENT_AMBULATORY_CARE_PROVIDER_SITE_OTHER): Payer: Medicare Other | Admitting: Orthopedic Surgery

## 2016-10-04 DIAGNOSIS — Z96659 Presence of unspecified artificial knee joint: Secondary | ICD-10-CM | POA: Insufficient documentation

## 2016-10-04 DIAGNOSIS — Z96652 Presence of left artificial knee joint: Secondary | ICD-10-CM

## 2016-10-04 NOTE — Telephone Encounter (Signed)
Patient called asking about her compression hose, said the pharmacy needed more information. CB # [phone]

## 2016-10-04 NOTE — Telephone Encounter (Signed)
Tried calling patient back to discuss what she was needing. No answer. LMVM for her that I was returning call.

## 2016-10-07 NOTE — Progress Notes (Signed)
Post-Op Visit Note   Patient: [name]           Date of Birth: February 20, 1961           MRN: [id number] Visit Date: 10/04/2016 PCP: Velna Hatchet, MD   Assessment & Plan:  Chief Complaint:  Chief Complaint  Patient presents with  . Left Knee - Routine Post Op   Visit Diagnoses:  1. Status post total left knee replacement     Plan: Valentine is a patient who is now 6 weeks out left total knee replacement.  She's doing well.  In physical therapy 3 times a week.  A relating with cane.  Oxycodone refilled.  Range of motion is past 90.  Extensor mechanism is intact.  Plan is to continue therapy.  6 week return for clinical recheck.  Oxycodone refilled.  Follow-Up Instructions: Return in about 6 weeks (around 11/15/2016).   Orders:  No orders of the defined types were placed in this encounter.  No orders of the defined types were placed in this encounter.   Imaging: No results found.  PMFS History: Patient Active Problem List   Diagnosis Date Noted  . 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 Presence of left artificial knee joint 09/20/2016  . Arthritis of knee 08/22/2016  . Cervical radiculopathy 07/26/2016  . Cervical disc disorder with radiculopathy 07/26/2016  . Right arm pain 06/29/2016  . Cervicalgia 06/29/2016  . Primary osteoarthritis of left knee 06/29/2016  . Hypersomnia with sleep apnea 11/18/2015  . Lethargy 11/18/2015  . Chest pain 09/30/2014  . Abnormal cardiac function test 09/29/2014  . Chest pain with moderate risk for cardiac etiology 09/28/2014  . HTN (hypertension) 09/28/2014  . Hypokalemia   . Paresthesia 06/18/2014  . Numbness and tingling of left arm and leg   . Unstable angina ([place]) 05/24/2014  . OSA on CPAP 05/24/2014  . Diabetes mellitus type 2 in obese ([place]) 10/29/2012  . S/P laparoscopic appendectomy 06/04/2012  . Diastolic CHF ([place]) 54/65/0354  . Mediastinal mass 01/09/2012  . SOB (shortness of breath) 06/20/2011  .  Mediastinal abnormality 06/20/2011  . Dyslipidemia 12/23/2009  . GLAUCOMA 12/23/2009  . ARTHRITIS 12/23/2009  . 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 Morbid obesity-BMI 45 09/13/2006  . ANXIETY STATE NOS 09/13/2006  . DISORDER, DEPRESSIVE NEC 09/13/2006  . CARPAL TUNNEL SYNDROME, MILD 09/13/2006  . Unspecified essential hypertension 09/13/2006  . IBS 09/13/2006  . DEGENERATION, LUMBAR/LUMBOSACRAL DISC 09/13/2006  . SYMPTOM, SWELLING/MASS/LUMP IN CHEST 09/13/2006  . PULMONARY EMBOLISM, HX OF 09/13/2006   Past Medical History:  Diagnosis Date  . Anginal pain ([place])    a. NL cath in 2008;  b. Myoview 03/2011: dec uptake along mid anterior wall on stress imaging -> ? attenuation vs. ischemia, EF 65%;  c. Echo 04/2011: EF 55-60%, no RWMA, Gr 2 dd  . Anxiety   . Arthritis   . Asthma   . Bone cancer ([place])   . Cancer ([place])   . CHF (congestive heart failure) ([place])   . CHF (congestive heart failure) ([place])   . Depression   . Diabetes mellitus   . Dyspnea    with exertion  . Frequent urination   . GERD (gastroesophageal reflux disease)   . Headache   . Hypertension   . 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 Obesity   . Pulmonary edema   . Pulmonary embolism ([place])    a. 2008 -> coumadin x 6  mos.  . Sleep apnea    does not use CPAP  . Urinary urgency     Family History  Problem Relation Age of Onset  . Emphysema Mother   . Arthritis Mother   . Heart failure Mother        alive @ 74  . Asthma Brother   . Heart disease Father        died @ 73's.  . Stroke Father   . Diabetes Sister   . Heart disease Paternal Grandfather   . Colon cancer Maternal Grandfather   . Breast cancer Maternal Aunt     Past Surgical History:  Procedure Laterality Date  . ABDOMINAL HYSTERECTOMY  2005  . CARDIAC CATHETERIZATION     Normal  . CARDIAC CATHETERIZATION N/A 09/30/2014   Procedure: Left Heart Cath and Coronary Angiography;  Surgeon: Sherren Mocha, MD;  Location: [facility];  Service: Cardiovascular;  Laterality: N/A;  . LAPAROSCOPIC APPENDECTOMY N/A 06/03/2012   Procedure: APPENDECTOMY LAPAROSCOPIC;  Surgeon: Stark Klein, MD;  Location: [facility];  Service: General;  Laterality: N/A;  . Left knee surgery  2008  . LEG SURGERY    . TONSILLECTOMY    . TOTAL KNEE ARTHROPLASTY Left 08/22/2016   Procedure: TOTAL KNEE ARTHROPLASTY;  Surgeon: Meredith Pel, MD;  Location: [facility];  Service: Orthopedics;  Laterality: Left;  . TUBAL LIGATION  1989   Social History   Occupational History  . UNEMPLOYED Disabled   Social History Main Topics  . Smoking status: Former Smoker    Packs/day: 0.50    Years: 15.00    Types: Cigarettes    Quit date: 04/24/1985  . Smokeless tobacco: Never Used  . Alcohol use No  . Drug use: No  . Sexual activity: No

## 2016-10-09 NOTE — Progress Notes (Deleted)
Cardiology Office Note    Date:  10/09/2016   ID:  [name], DOB 09/10/1960, MRN [id number]  PCP:  Velna Hatchet, MD  Cardiologist:  Dr. Johnsie Cancel   CC: follow up   History of Present Illness:  [name] is a 56 y.o. female with a history of pbesity, DMT2, OSA, HTN, HLD, chronic diastolic CHF and possible PE (2008) Rx'd w/coumadin who presents to clinic for follow up.   She had a cath in 2008 that was normal. She was treated with coumadin for 6 months in 2008 for a question of pulmonary embolus on CT. CTA in 02/2010 showed no pulmonary embolus.   She had an myoview in 2014 that was normal. 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   She was seen in the office by Dr. Johnsie Cancel in 09/2014 for evaluation of chest pain. He wrote "she apparently has had intermittent chest pain for years." He did not feel further testing was indicated at that time. She was admitted shortly after that appointment for recurrent CP. She ruled out for MI but nuclear stress test was abnormal. Cath during this admission showed normal coronary arteries with elevated LVEDP. ECHO during that admission showed normal EF with mobility to the IAS - suggestive of aneurysm. The flickering mobile density seen in the LA in the subcostal views previously was not noted in this study.  2D ECHO 1/18/showed EF 55-60%, G2DD and no mention of atrial septal aneurysm or myxoma.   She was admitted in 05/2016 for acute respiratory failure 2/2 PNA. Readmitted in 08/2016 for TKA.   Today she presents to clinic for follow up.   Past Medical History:  Diagnosis Date  . Anginal pain ([place])    a. NL cath in 2008;  b. Myoview 03/2011: dec uptake along mid anterior wall on stress imaging -> ? attenuation vs. ischemia, EF 65%;  c. Echo 04/2011: EF 55-60%, no RWMA, Gr 2 dd  . Anxiety   . Arthritis   . Asthma   . Bone cancer ([place])   . Cancer ([place])   . CHF (congestive heart failure)  ([place])   . CHF (congestive heart failure) ([place])   . Depression   . Diabetes mellitus   . Dyspnea    with exertion  . Frequent urination   . GERD (gastroesophageal reflux disease)   . Headache   . Hypertension   . 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 Obesity   . Pulmonary edema   . Pulmonary embolism ([place])    a. 2008 -> coumadin x 6 mos.  . Sleep apnea    does not use CPAP  . Urinary urgency     Past Surgical History:  Procedure Laterality Date  . ABDOMINAL HYSTERECTOMY  2005  . CARDIAC CATHETERIZATION     Normal  . CARDIAC CATHETERIZATION N/A 09/30/2014   Procedure: Left Heart Cath and Coronary Angiography;  Surgeon: Sherren Mocha, MD;  Location: [facility];  Service: Cardiovascular;  Laterality: N/A;  . LAPAROSCOPIC APPENDECTOMY N/A 06/03/2012   Procedure: APPENDECTOMY LAPAROSCOPIC;  Surgeon: Stark Klein, MD;  Location: [facility];  Service: General;  Laterality: N/A;  . Left knee surgery  2008  . LEG SURGERY    . TONSILLECTOMY    . TOTAL KNEE ARTHROPLASTY Left 08/22/2016   Procedure: TOTAL KNEE ARTHROPLASTY;  Surgeon: Meredith Pel, MD;  Location: [facility];  Service: Orthopedics;  Laterality:  Left;  . TUBAL LIGATION  1989    Current Medications: Outpatient Medications Prior to Visit  Medication Sig Dispense Refill  . albuterol (PROVENTIL HFA;VENTOLIN HFA) 108 (90 Base) MCG/ACT inhaler Inhale 2 puffs into the lungs every 4 (four) hours as needed for wheezing or shortness of breath.    [name] atorvastatin (LIPITOR) 40 MG tablet Take 40 mg by mouth at bedtime.     . benzonatate (TESSALON) 200 MG capsule Take 1 capsule (200 mg total) by mouth 3 (three) times daily as needed for cough (cough unrelieved by robitussin). (Patient not taking: Reported on 09/30/2016) 20 capsule 0  . budesonide-formoterol (SYMBICORT) 160-4.5 MCG/ACT inhaler Inhale 2 puffs into the lungs 2 (two) times daily. 1 Inhaler 3  . carvedilol (COREG CR) 10 MG 24 hr capsule Take 10 mg by mouth  daily.    . cyclobenzaprine (FLEXERIL) 5 MG tablet Take 5 mg by mouth 2 (two) times daily.     . furosemide (LASIX) 40 MG tablet Take 40 mg by mouth daily.    [name] gabapentin (NEURONTIN) 300 MG capsule Take 2 capsules (600 mg total) by mouth 3 (three) times daily. 180 capsule 2  . ipratropium-albuterol (DUONEB) 0.5-2.5 (3) MG/3ML SOLN Take 3 mLs by nebulization 3 (three) times daily. Use 3 times daily for 4 days, then 4-6 hours as needed. Stop date 09/04/16    . isosorbide dinitrate (ISORDIL) 20 MG tablet Take 20 mg by mouth 2 (two) times daily.     [name] KLOR-CON M10 10 MEQ tablet Take 10 mEq by mouth 2 (two) times daily.     [name] KOMBIGLYZE XR 5-500 MG TB24 Take 1 tablet by mouth every morning.     [name] lisinopril (PRINIVIL,ZESTRIL) 10 MG tablet Take 10 mg by mouth 2 (two) times daily. Take 2 tabs to = 20 mg qd    . loratadine (CLARITIN) 10 MG tablet Take 1 tablet (10 mg total) by mouth daily. 30 tablet 3  . nitroGLYCERIN (NITROSTAT) 0.4 MG SL tablet Place 0.4 mg under the tongue every 5 (five) minutes as needed. For chest pain.    [name] oxyCODONE (OXY IR/ROXICODONE) 5 MG immediate release tablet Take 1-2 tablets (5-10 mg total) by mouth every 3 (three) hours as needed for breakthrough pain. 60 tablet 0  . pantoprazole (PROTONIX) 40 MG tablet Take 1 tablet (40 mg total) by mouth daily. 30 tablet 0  . sertraline (ZOLOFT) 100 MG tablet Take 100 mg by mouth daily.      No facility-administered medications prior to visit.      Allergies:   Penicillins; Latex; and Sulfa antibiotics   Social History   Social History  . Marital status: Legally Separated    Spouse name: N/A  . Number of children: N/A  . Years of education: N/A   Occupational History  . UNEMPLOYED Disabled   Social History Main Topics  . Smoking status: Former Smoker    Packs/day: 0.50    Years: 15.00    Types: Cigarettes    Quit date: 04/24/1985  . Smokeless tobacco: Never Used  . Alcohol use No  . Drug use: No  . Sexual activity: No     Other Topics Concern  . Not on file   Social History Narrative   Lives in [place] by herself.  Disabled.  Caffeine 1 cup avg daily.  3 kids, 6 grandkids.       Family History:  The patient's ***family history includes Arthritis in her mother; Asthma in her brother;  Breast cancer in her maternal aunt; Colon cancer in her maternal grandfather; Diabetes in her sister; Emphysema in her mother; Heart disease in her father and paternal grandfather; Heart failure in her mother; Stroke in her father.      *** ROS/PE    Wt Readings from Last 3 Encounters:  09/15/16 232 lb (105.2 kg)  08/31/16 232 lb 3.2 oz (105.3 kg)  08/30/16 232 lb 3.2 oz (105.3 kg)      Studies/Labs Reviewed:   EKG:  EKG is*** ordered today.  The ekg ordered today demonstrates ***  Recent Labs: 05/21/2016: Magnesium 1.9 05/24/2016: TSH 0.112 09/06/2016: ALT 19 09/30/2016: BUN 12; Creatinine, Ser 0.94; Hemoglobin 11.3; Platelets 212; Potassium 3.4; Sodium 140   Lipid Panel    Component Value Date/Time   CHOL 173 04/23/2011 0830   CHOL 171 04/23/2011 0830   TRIG 147 04/23/2011 0830   TRIG 143 04/23/2011 0830   HDL 41 04/23/2011 0830   HDL 40 04/23/2011 0830   CHOLHDL 4.2 04/23/2011 0830   CHOLHDL 4.3 04/23/2011 0830   VLDL 29 04/23/2011 0830   VLDL 29 04/23/2011 0830   LDLCALC 103 (H) 04/23/2011 0830   LDLCALC 102 (H) 04/23/2011 0830    Additional studies/ records that were reviewed today include:  2D Echo 05/26/2014: Study Conclusions - Left ventricle: The cavity size was normal. Wall thickness was normal. Systolic function was normal. The estimated ejection fraction was in the range of 60% to 65%. Wall motion was normal; there were no regional wall motion abnormalities. Doppler parameters are consistent with abnormal left ventricular relaxation (grade 1 diastolic dysfunction). The E/e&' ratio is >15, suggesting elevated LV filling pressure. - Mitral valve: Mildly thickened leaflets . There was  mild regurgitation. - Left atrium: The atrium was normal in size. - Atrial septum: Bows from left to right, suggesting high LA pressure. There is a possible pendunculated mass (measuring about 7 mm round) attached to the interatrial septum. it is seen going in and out of the echo plane in the left atrium, best in the subcostal views. This may represent myxoma or aneurysmal interatrial septum being seen in and out of plane. No PFO is noted. Consider TEE for further evaluation. - Pulmonic valve: Poorly visualized. There was mild regurgitation. - Inferior vena cava: The vessel was dilated. The respirophasic diameter changes were blunted (< 50%), consistent with elevated central venous pressure. Impressions: - Compared to the prior echo in 2013, there is now diasolic dysfunction with elevated LV filling pressure. There is a question of atrial myxoma versus atrial septal aneurysm seen in subcostal views, Mobile mass measuring <1 cm. Consider TEE to evaluate further.  Myvoew 09/28/14  Findings consistent with prior myocardial infarction with peri-infarct ischemia.  The left ventricular ejection fraction is normal (55-65%).  Defect 1: There is a medium defect of mild severity present in the mid inferolateral, mid anterolateral, apical lateral and apex location.  Suspected interval infarct in the distal lateral wall with suspected peri-infarct ischemia, especially in apex and anterolateral margin.   Cath 09/30/14 Conclusion  FINAL CONCLUSIONS:  ANGIOGRAPHICALLY NORMAL CORONARY ARTERIES  NORMAL LV FUNCTION  ELEVATED LVEDP  SUSPECT NONCARDIAC CHEST PAIN. NEEDS AGGRESSIVE MEDICAL MANAGEMENT AND LIFESTYLE MODIFICATION   2D ECHO: 09/28/2014 LV EF: 55% -   60% Study Conclusions - Left ventricle: The cavity size was normal. Wall thickness was   normal. Systolic function was normal. The estimated ejection   fraction was in the range of 55% to 60%. Wall motion  was  normal;   there were no regional wall motion abnormalities. Doppler   parameters are consistent with abnormal left ventricular   relaxation (grade 1 diastolic dysfunction). The E/e&' ratio is   between 8-15, suggesting indeterminate LV filling pressure. - Atrial septum: The atrial septum appears aneurysmal - no evidence   for PFO. No clear mass is identified. - Tricuspid valve: There was trivial regurgitation. Envelope was   inadequate to estimate RVSP. - Systemic veins: The IVC measures <2.1 cm, but does not collapse   more than 50%, suggesting an elevated RA pressure of 8 mmHg. Impressions: - Compared to the prior echo in 05/2014, there are few changes.   There is again mobility to the IAS - suggestive of aneurysm. The   flickering mobile density seen in the LA in the subcostal views   previously is not noted in this study.    2D ECHO: 05/22/2016 LV EF: 55% -   60% Study Conclusions - Left ventricle: The cavity size was normal. Wall thickness was   normal. Systolic function was normal. The estimated ejection   fraction was in the range of 55% to 60%. Although no diagnostic   regional wall motion abnormality was identified, this possibility   cannot be completely excluded on the basis of this study.   Features are consistent with a pseudonormal left ventricular   filling pattern, with concomitant abnormal relaxation and   increased filling pressure (grade 2 diastolic dysfunction). - Aortic valve: There was no stenosis. - Mitral valve: There was no significant regurgitation. - Right ventricle: The cavity size was normal. Systolic function   was normal. - Pulmonary arteries: No complete TR doppler jet so unable to   estimate PA systolic pressure. - Systemic veins: IVC not visualized. Impressions: - Normal LV size with EF 55-60%. Moderate diastolic dysfunction.   Normal RV size and systolic function. No significant valvular   abnormalities.   ASSESSMENT & PLAN:   Chronic  diastolic CHF:  HTN:  HLD:  Chest pain:  ? Atrial septal aneurysm vs Mxyoma: not mentioned on most recent echo  DMT2:  Obesity:  Medication Adjustments/Labs and Tests Ordered: Current medicines are reviewed at length with the patient today.  Concerns regarding medicines are outlined above.  Medication changes, Labs and Tests ordered today are listed in the Patient Instructions below. There are no Patient Instructions on file for this visit.   Signed, Angelena Form, PA-C  10/09/2016 12:16 PM    [facility] [address], [place], [place]  [address] Phone: [phone]; Fax: [phone]

## 2016-10-10 ENCOUNTER — Ambulatory Visit: Payer: Medicare Other | Admitting: Physician Assistant

## 2016-10-10 NOTE — Progress Notes (Addendum)
Cardiology Office Note    Date:  10/12/2016   ID:  [name], DOB Mar 06, 1961, MRN [id number]  PCP:  Velna Hatchet, MD  Cardiologist:  Dr. Johnsie Cancel   CC: follow up   History of Present Illness:  [name] is a 56 y.o. female with a history of obesity, DMT2, OSA on CPAP, HTN, HLD, chronic diastolic CHF and possible PE (2008) Rx'd w/coumadin who presents to clinic for follow up.   She had a cath in 2008 that was normal. She was treated with coumadin for 6 months in 2008 for a question of pulmonary embolus on CT. CTA in 02/2010 showed no pulmonary embolus.   She had an myoview in 2014 that was normal. 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   She was seen in the office by Dr. Johnsie Cancel in 09/2014 for evaluation of chest pain. He wrote "she apparently has had intermittent chest pain for years." He did not feel further testing was indicated at that time. She was admitted shortly after that appointment for recurrent CP. She ruled out for MI but nuclear stress test was abnormal. Cath during this admission showed normal coronary arteries with elevated LVEDP. ECHO during that admission showed normal EF with mobility to the IAS - suggestive of aneurysm. The flickering mobile density seen in the LA in the subcostal viewspreviously was not noted in this study.  2D ECHO 05/22/2016 showed EF 55-60%, G2DD and no mention of atrial septal aneurysm or myxoma.   She was admitted in 05/2016 for acute respiratory failure 2/2 PNA,  Troponin was mildly elevated but flat 0.05--> 0.05--> 0.05.   Readmitted in 08/2016 for elective TKA. She had some swelling after surgery and some leg pain. She went to the ER on 09/30/16 where LE dopplers were negative for DVT but troponin was noted to be 0.13--> 0.12. CTA was negative for PE. She was told to follow up with cardiology.   Today she presents to clinic for follow up. She has been doing pretty well. She  has had some continued foot swelling after her knee replacement. She does not have any chest pain but does get some SOB. She does feel like she sometimes retains fluid. She sleeps with CPAP. Sleeps on an incline chronically. No PND. No dizziness or syncope. No blood in stool or urine.    Past Medical History:  Diagnosis Date  . Anginal pain ([place])    a. NL cath in 2008;  b. Myoview 03/2011: dec uptake along mid anterior wall on stress imaging -> ? attenuation vs. ischemia, EF 65%;  c. Echo 04/2011: EF 55-60%, no RWMA, Gr 2 dd  . Anxiety   . Arthritis   . Asthma   . Bone cancer ([place])   . Cancer ([place])   . CHF (congestive heart failure) ([place])   . CHF (congestive heart failure) ([place])   . Depression   . Diabetes mellitus   . Dyspnea    with exertion  . Frequent urination   . GERD (gastroesophageal reflux disease)   . Headache   . Hypertension   . 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 Obesity   . Pulmonary edema   . Pulmonary embolism ([place])    a. 2008 -> coumadin x 6 mos.  . Sleep apnea    does not use CPAP  . Urinary urgency     Past Surgical History:  Procedure Laterality Date  .  ABDOMINAL HYSTERECTOMY  2005  . CARDIAC CATHETERIZATION     Normal  . CARDIAC CATHETERIZATION N/A 09/30/2014   Procedure: Left Heart Cath and Coronary Angiography;  Surgeon: Sherren Mocha, MD;  Location: [facility];  Service: Cardiovascular;  Laterality: N/A;  . LAPAROSCOPIC APPENDECTOMY N/A 06/03/2012   Procedure: APPENDECTOMY LAPAROSCOPIC;  Surgeon: Stark Klein, MD;  Location: [facility];  Service: General;  Laterality: N/A;  . Left knee surgery  2008  . LEG SURGERY    . TONSILLECTOMY    . TOTAL KNEE ARTHROPLASTY Left 08/22/2016   Procedure: TOTAL KNEE ARTHROPLASTY;  Surgeon: Meredith Pel, MD;  Location: [facility];  Service: Orthopedics;  Laterality: Left;  . TUBAL LIGATION  1989    Current Medications: Outpatient Medications Prior to Visit  Medication Sig Dispense Refill   . albuterol (PROVENTIL HFA;VENTOLIN HFA) 108 (90 Base) MCG/ACT inhaler Inhale 2 puffs into the lungs every 4 (four) hours as needed for wheezing or shortness of breath.    [name] atorvastatin (LIPITOR) 40 MG tablet Take 40 mg by mouth at bedtime.     . budesonide-formoterol (SYMBICORT) 160-4.5 MCG/ACT inhaler Inhale 2 puffs into the lungs 2 (two) times daily. 1 Inhaler 3  . carvedilol (COREG CR) 10 MG 24 hr capsule Take 10 mg by mouth daily.    . cyclobenzaprine (FLEXERIL) 5 MG tablet Take 5 mg by mouth 2 (two) times daily.     . furosemide (LASIX) 40 MG tablet Take 40 mg by mouth daily.    [name] gabapentin (NEURONTIN) 300 MG capsule Take 2 capsules (600 mg total) by mouth 3 (three) times daily. 180 capsule 2  . ipratropium-albuterol (DUONEB) 0.5-2.5 (3) MG/3ML SOLN Take 3 mLs by nebulization 3 (three) times daily. Use 3 times daily for 4 days, then 4-6 hours as needed. Stop date 09/04/16    . isosorbide dinitrate (ISORDIL) 20 MG tablet Take 20 mg by mouth 2 (two) times daily.     [name] KLOR-CON M10 10 MEQ tablet Take 10 mEq by mouth 2 (two) times daily.     [name] KOMBIGLYZE XR 5-500 MG TB24 Take 1 tablet by mouth every morning.     [name] lisinopril (PRINIVIL,ZESTRIL) 10 MG tablet Take 10 mg by mouth 2 (two) times daily. Take 2 tabs to = 20 mg qd    . loratadine (CLARITIN) 10 MG tablet Take 1 tablet (10 mg total) by mouth daily. 30 tablet 3  . nitroGLYCERIN (NITROSTAT) 0.4 MG SL tablet Place 0.4 mg under the tongue every 5 (five) minutes as needed. For chest pain.    [name] oxyCODONE (OXY IR/ROXICODONE) 5 MG immediate release tablet Take 1-2 tablets (5-10 mg total) by mouth every 3 (three) hours as needed for breakthrough pain. 60 tablet 0  . pantoprazole (PROTONIX) 40 MG tablet Take 1 tablet (40 mg total) by mouth daily. 30 tablet 0  . sertraline (ZOLOFT) 100 MG tablet Take 100 mg by mouth daily.     . benzonatate (TESSALON) 200 MG capsule Take 1 capsule (200 mg total) by mouth 3 (three) times daily as needed for cough  (cough unrelieved by robitussin). (Patient not taking: Reported on 09/30/2016) 20 capsule 0   No facility-administered medications prior to visit.      Allergies:   Penicillins; Latex; and Sulfa antibiotics   Social History   Social History  . Marital status: Legally Separated    Spouse name: N/A  . Number of children: N/A  . Years of education: N/A   Occupational  History  . UNEMPLOYED Disabled   Social History Main Topics  . Smoking status: Former Smoker    Packs/day: 0.50    Years: 15.00    Types: Cigarettes    Quit date: 04/24/1985  . Smokeless tobacco: Never Used  . Alcohol use No  . Drug use: No  . Sexual activity: No   Other Topics Concern  . None   Social History Narrative   Lives in [place] by herself.  Disabled.  Caffeine 1 cup avg daily.  3 kids, 6 grandkids.       Family History:  The patient's family history includes Arthritis in her mother; Asthma in her brother; Breast cancer in her maternal aunt; Colon cancer in her maternal grandfather; Diabetes in her sister; Emphysema in her mother; Heart disease in her father and paternal grandfather; Heart failure in her mother; Stroke in her father and mother.      ROS:   Please see the history of present illness.    ROS All other systems reviewed and are negative.   PHYSICAL EXAM:   VS:  BP 122/78   Pulse 86   Ht 4\' 11"  (1.499 m)   Wt 231 lb 12.8 oz (105.1 kg)   BMI 46.82 kg/m    GEN: Well nourished, well developed, in no acute distress, obsee HEENT: normal  Neck: no JVD (but difficult to assess given obesity), carotid bruits, or masses Cardiac: RRR; no murmurs, rubs, or gallops,no edema  Respiratory:  clear to auscultation bilaterally, normal work of breathing GI: soft, nontender, nondistended, + BS MS: no deformity or atrophy  Skin: warm and dry, no rash Neuro:  Alert and Oriented x 3, Strength and sensation are intact Psych: euthymic mood, full affect    Wt Readings from Last 3 Encounters:  10/12/16  231 lb 12.8 oz (105.1 kg)  09/15/16 232 lb (105.2 kg)  08/31/16 232 lb 3.2 oz (105.3 kg)      Studies/Labs Reviewed:   EKG:  EKG is NOT ordered today.    Recent Labs: 05/21/2016: Magnesium 1.9 05/24/2016: TSH 0.112 09/06/2016: ALT 19 09/30/2016: BUN 12; Creatinine, Ser 0.94; Hemoglobin 11.3; Platelets 212; Potassium 3.4; Sodium 140   Lipid Panel    Component Value Date/Time   CHOL 173 04/23/2011 0830   CHOL 171 04/23/2011 0830   TRIG 147 04/23/2011 0830   TRIG 143 04/23/2011 0830   HDL 41 04/23/2011 0830   HDL 40 04/23/2011 0830   CHOLHDL 4.2 04/23/2011 0830   CHOLHDL 4.3 04/23/2011 0830   VLDL 29 04/23/2011 0830   VLDL 29 04/23/2011 0830   LDLCALC 103 (H) 04/23/2011 0830   LDLCALC 102 (H) 04/23/2011 0830    Additional studies/ records that were reviewed today include:  2D Echo 05/26/2014: Study Conclusions - Left ventricle: The cavity size was normal. Wall thickness was normal. Systolic function was normal. The estimated ejection fraction was in the range of 60% to 65%. Wall motion was normal; there were no regional wall motion abnormalities. Doppler parameters are consistent with abnormal left ventricular relaxation (grade 1 diastolic dysfunction). The E/e&' ratio is >15, suggesting elevated LV filling pressure. - Mitral valve: Mildly thickened leaflets . There was mild regurgitation. - Left atrium: The atrium was normal in size. - Atrial septum: Bows from left to right, suggesting high LA pressure. There is a possible pendunculated mass (measuring about 7 mm round) attached to the interatrial septum. it is seen going in and out of the echo plane in the left atrium, best  in the subcostal views. This may represent myxoma or aneurysmal interatrial septum being seen in and out of plane. No PFO is noted. Consider TEE for further evaluation. - Pulmonic valve: Poorly visualized. There was mild regurgitation. - Inferior vena cava: The vessel was  dilated. The respirophasic diameter changes were blunted (<50%), consistent with elevated central venous pressure. Impressions: - Compared to the prior echo in 2013, there is now diasolic dysfunction with elevated LV filling pressure. There is a question of atrial myxoma versus atrial septal aneurysm seen in subcostal views, Mobile mass measuring <1 cm. Consider TEE to evaluate further.  Myvoew 09/28/14  Findings consistent with prior myocardial infarction with peri-infarct ischemia.  The left ventricular ejection fraction is normal (55-65%).  Defect 1: There is a medium defect of mild severity present in the mid inferolateral, mid anterolateral, apical lateral and apex location.  Suspected interval infarct in the distal lateral wall with suspected peri-infarct ischemia, especially in apex and anterolateral margin.   Cath 09/30/14 Conclusion  FINAL CONCLUSIONS:  ANGIOGRAPHICALLY NORMAL CORONARY ARTERIES  NORMAL LV FUNCTION  ELEVATED LVEDP  SUSPECT NONCARDIAC CHEST PAIN. NEEDS AGGRESSIVE MEDICAL MANAGEMENT AND LIFESTYLE MODIFICATION   2D ECHO: 09/28/2014 LV EF: 55% - 60% Study Conclusions - Left ventricle: The cavity size was normal. Wall thickness was normal. Systolic function was normal. The estimated ejection fraction was in the range of 55% to 60%. Wall motion was normal; there were no regional wall motion abnormalities. Doppler parameters are consistent with abnormal left ventricular relaxation (grade 1 diastolic dysfunction). The E/e&' ratio is between 8-15, suggesting indeterminate LV filling pressure. - Atrial septum: The atrial septum appears aneurysmal - no evidence for PFO. No clear mass is identified. - Tricuspid valve: There was trivial regurgitation. Envelope was inadequate to estimate RVSP. - Systemic veins: The IVC measures <2.1 cm, but does not collapse more than 50%, suggesting an elevated RA pressure of 8  mmHg. Impressions: - Compared to the prior echo in 05/2014, there are few changes. There is again mobility to the IAS - suggestive of aneurysm. The flickering mobile density seen in the LA in the subcostal views previously is not noted in this study.    2D ECHO: 05/22/2016 LV EF: 55% - 60% Study Conclusions - Left ventricle: The cavity size was normal. Wall thickness was normal. Systolic function was normal. The estimated ejection fraction was in the range of 55% to 60%. Although no diagnostic regional wall motion abnormality was identified, this possibility cannot be completely excluded on the basis of this study. Features are consistent with a pseudonormal left ventricular filling pattern, with concomitant abnormal relaxation and increased filling pressure (grade 2 diastolic dysfunction). - Aortic valve: There was no stenosis. - Mitral valve: There was no significant regurgitation. - Right ventricle: The cavity size was normal. Systolic function was normal. - Pulmonary arteries: No complete TR doppler jet so unable to estimate PA systolic pressure. - Systemic veins: IVC not visualized. Impressions: - Normal LV size with EF 55-60%. Moderate diastolic dysfunction. Normal RV size and systolic function. No significant valvular abnormalities.    ASSESSMENT & PLAN:   Elevated troponin: troponin noted to be ~0.12 in the ER when being evaluated for acute leg pain. Told to follow up with cardiologist. Not sure why troponin was drawn. Doubt ACS as she has had no chest pain and had two caths in the past with normal cors (most recent in 2016). She does have a history of questionable of PE that was treated with coumadin. She  had a CT angio 09/30/16 that was negative for PE. Discussed case with Dr. Johnsie Cancel. Plan will be to recheck a troponin and CK-MB. If troponin remains elevated on repeat blood draw, will discuss with Dr. Johnsie Cancel for next step.  Chronic  diastolic CHF: she does not appear overtly volume overloaded on exam, but difficult to tell given obese body habitus. Will check a BMET and BNP to rule out occult CHF. Continue Lasix 40mg  daily.   HTN: BP well controlled today  HLD: continue statin   Chest pain: no longer having chest pain  ? Atrial septal aneurysm vs Mxyoma: not mentioned on most recent echo  DMT2: continue follow up with PCP  Morbid Obesity: Body mass index is 46.82 kg/m. Counseled on the importance of diet and exercise.    Medication Adjustments/Labs and Tests Ordered: Current medicines are reviewed at length with the patient today.  Concerns regarding medicines are outlined above.  Medication changes, Labs and Tests ordered today are listed in the Patient Instructions below. There are no Patient Instructions on file for this visit.   Mable Fill, PA-C  10/12/2016 11:25 AM    [facility] [address], [place], [place]  [address] Phone: [phone]; Fax: [phone]

## 2016-10-12 ENCOUNTER — Encounter: Payer: Self-pay | Admitting: Physician Assistant

## 2016-10-12 ENCOUNTER — Ambulatory Visit (INDEPENDENT_AMBULATORY_CARE_PROVIDER_SITE_OTHER): Payer: Medicare Other | Admitting: Physician Assistant

## 2016-10-12 VITALS — BP 122/78 | HR 86 | Ht 59.0 in | Wt 231.8 lb

## 2016-10-12 DIAGNOSIS — I5032 Chronic diastolic (congestive) heart failure: Secondary | ICD-10-CM

## 2016-10-12 DIAGNOSIS — R079 Chest pain, unspecified: Secondary | ICD-10-CM

## 2016-10-12 DIAGNOSIS — R7989 Other specified abnormal findings of blood chemistry: Principal | ICD-10-CM

## 2016-10-12 DIAGNOSIS — E785 Hyperlipidemia, unspecified: Secondary | ICD-10-CM

## 2016-10-12 DIAGNOSIS — I10 Essential (primary) hypertension: Secondary | ICD-10-CM

## 2016-10-12 DIAGNOSIS — R748 Abnormal levels of other serum enzymes: Secondary | ICD-10-CM | POA: Diagnosis not present

## 2016-10-12 DIAGNOSIS — E118 Type 2 diabetes mellitus with unspecified complications: Secondary | ICD-10-CM

## 2016-10-12 DIAGNOSIS — R778 Other specified abnormalities of plasma proteins: Secondary | ICD-10-CM

## 2016-10-12 LAB — BASIC METABOLIC PANEL
BUN/Creatinine Ratio: 8 — ABNORMAL LOW (ref 9–23)
BUN: 7 mg/dL (ref 6–24)
CO2: 24 mmol/L (ref 20–29)
Calcium: 9.2 mg/dL (ref 8.7–10.2)
Chloride: 103 mmol/L (ref 96–106)
Creatinine, Ser: 0.85 mg/dL (ref 0.57–1.00)
GFR calc Af Amer: 89 mL/min/{1.73_m2} (ref >59)
GFR calc non Af Amer: 77 mL/min/{1.73_m2} (ref >59)
Glucose: 188 mg/dL — ABNORMAL HIGH (ref 65–99)
Potassium: 4 mmol/L (ref 3.5–5.2)
Sodium: 143 mmol/L (ref 134–144)

## 2016-10-12 LAB — CREATININE KINASE MB: CK-MB Index: 1.9 ng/mL (ref 0.0–5.3)

## 2016-10-12 LAB — TROPONIN I: Troponin I: 0.11 ng/mL (ref 0.00–0.04)

## 2016-10-12 LAB — PRO B NATRIURETIC PEPTIDE: NT-Pro BNP: 144 pg/mL (ref 0–287)

## 2016-10-12 NOTE — Patient Instructions (Addendum)
Medication Instructions:  Your physician recommends that you continue on your current medications as directed. Please refer to the Current Medication list given to you today.   Labwork: TODAY:  TROPONIN, PRO BNP, BMET, & CKMB  Testing/Procedures: None ordered  Follow-Up: Your physician recommends that you schedule a follow-up appointment in: [place] DR. Johnsie Cancel   Any Other Special Instructions Will Be Listed Below (If Applicable).     If you need a refill on your cardiac medications before your next appointment, please call your pharmacy.

## 2016-10-18 ENCOUNTER — Telehealth (INDEPENDENT_AMBULATORY_CARE_PROVIDER_SITE_OTHER): Payer: Self-pay

## 2016-10-18 NOTE — Telephone Encounter (Signed)
Ok to rf pain meds?

## 2016-10-18 NOTE — Telephone Encounter (Signed)
Ok for 1 rf pls clAL THX

## 2016-10-18 NOTE — Telephone Encounter (Signed)
Patient called asking about her compression hose?  Would like a Rx refill on Hydrocodone?  CB# is [phone]  Please advise.  Thank you

## 2016-10-19 MED ORDER — HYDROCODONE-ACETAMINOPHEN 5-325 MG PO TABS: 1.0000 | ORAL_TABLET | Freq: Three times a day (TID) | ORAL | 0 refills | Status: DC | PRN

## 2016-10-19 NOTE — Telephone Encounter (Signed)
IC LM advising could pick up rx at front desk

## 2016-10-19 NOTE — Addendum Note (Signed)
Addended byLaurann Montana on: 10/19/2016 08:13 AM   Modules accepted: Orders

## 2016-10-20 ENCOUNTER — Telehealth (INDEPENDENT_AMBULATORY_CARE_PROVIDER_SITE_OTHER): Payer: Self-pay | Admitting: Orthopedic Surgery

## 2016-10-20 NOTE — Telephone Encounter (Signed)
Written. Advised patient may pick up at front desk.

## 2016-10-20 NOTE — Telephone Encounter (Signed)
Requesting new rx for thigh high stocking 30/20.

## 2016-10-24 IMAGING — US US ABDOMEN LIMITED
1 series · 14 of 25 positions shown · non-contrast
Comparison: None.

CLINICAL DATA: Epigastric pain

EXAM:
US ABDOMEN LIMITED - RIGHT UPPER QUADRANT

[Series 1: us abdomen limited · 0.21mm/px · 14 of 51 slices shown]
[im 1/51]
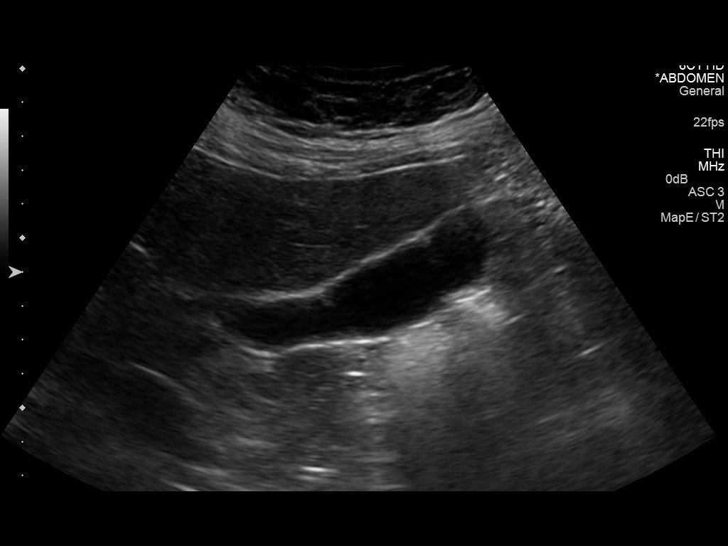
[im 5/51]
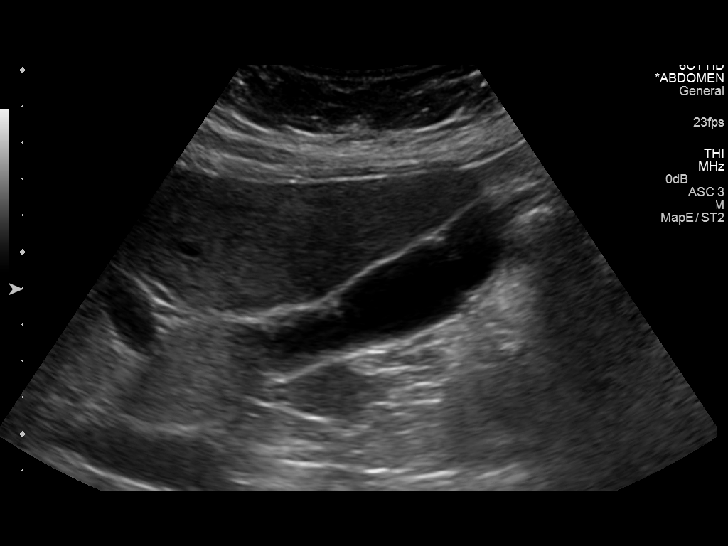
[im 9/51]
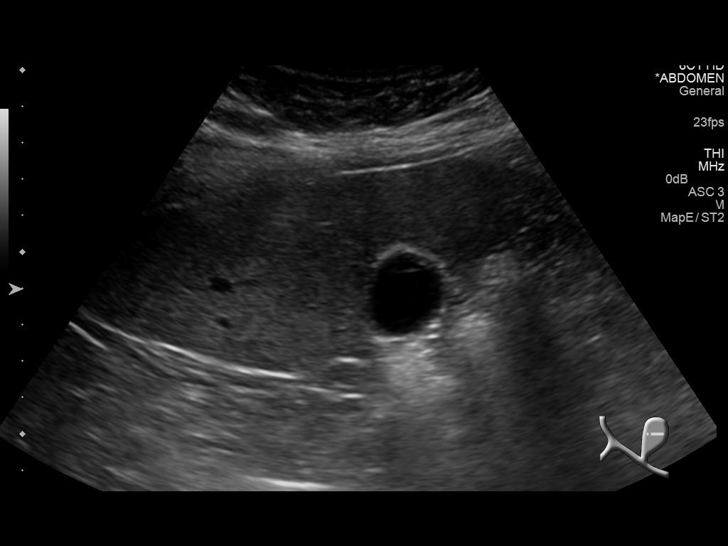
[im 13/51]
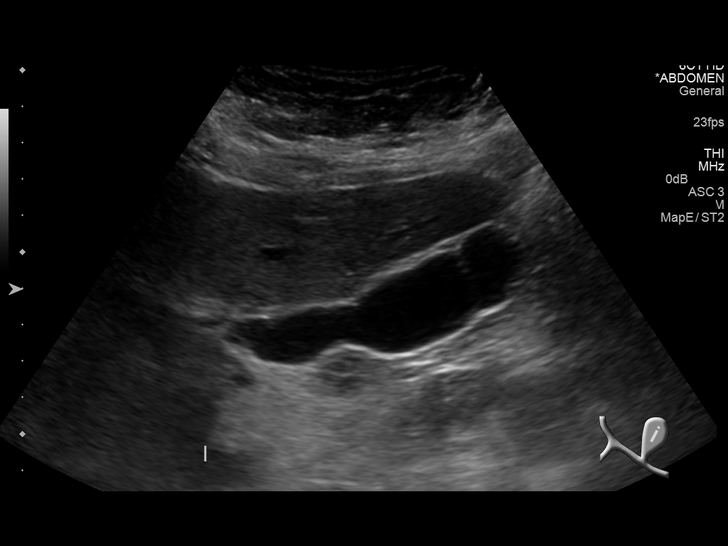
[im 17/51]
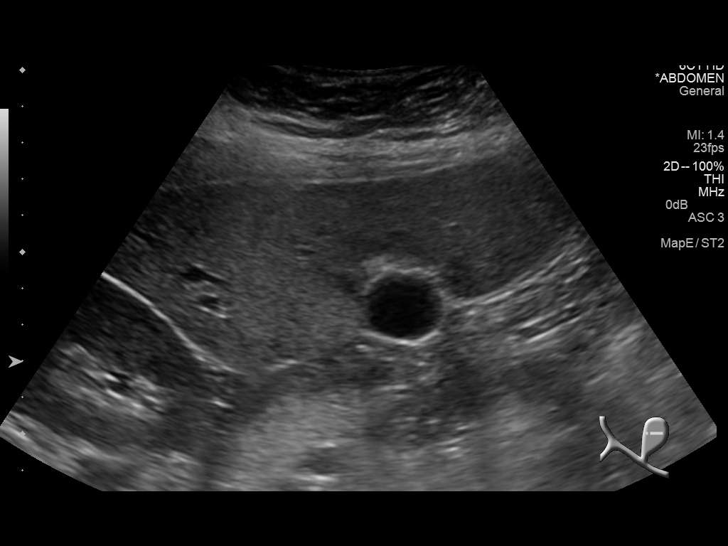
[im 19/51]
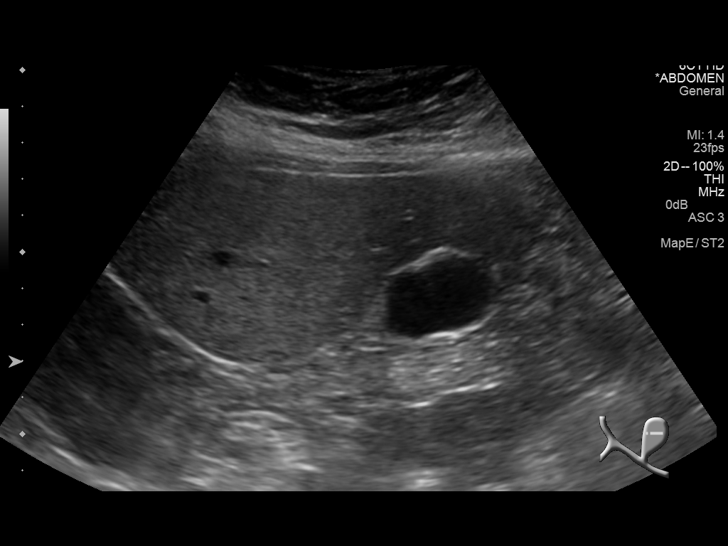
[im 23/51]
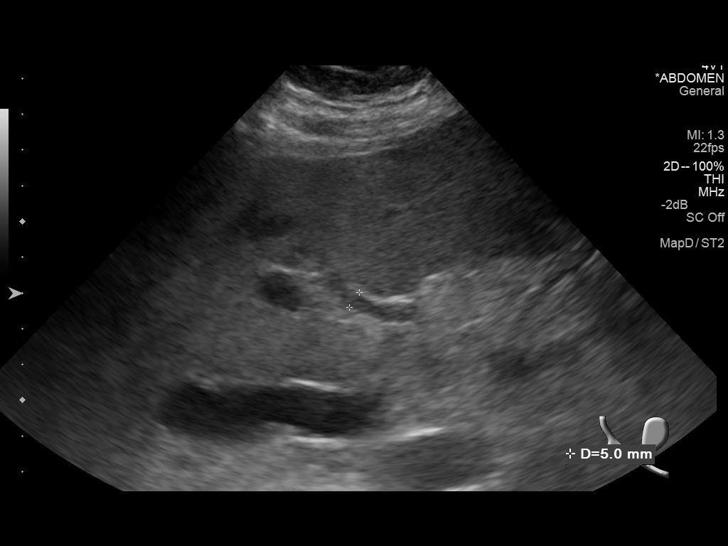
[im 28/51]
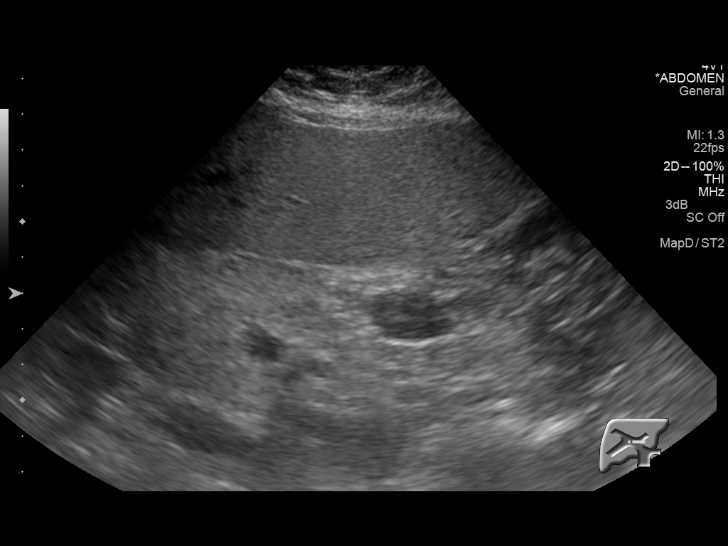
[im 32/51]
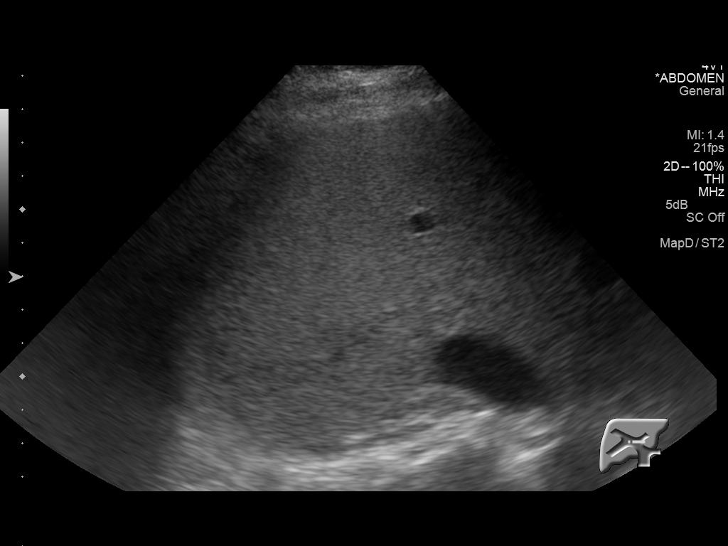
[im 34/51]
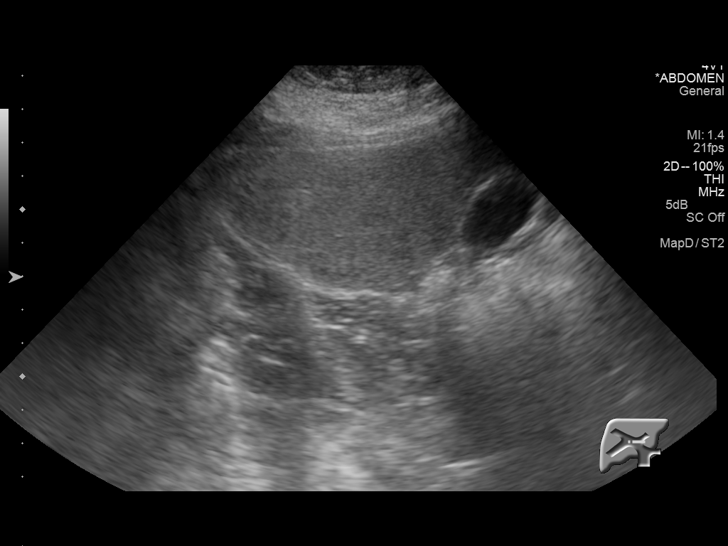
[im 38/51]
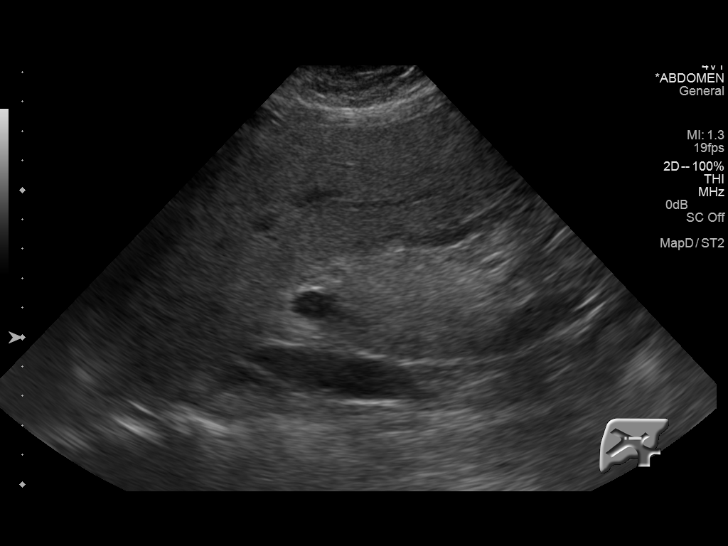
[im 42/51]
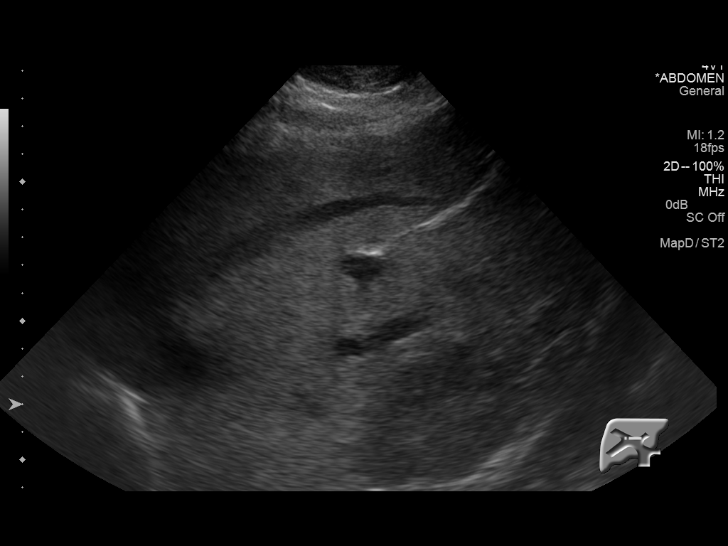
[im 46/51]
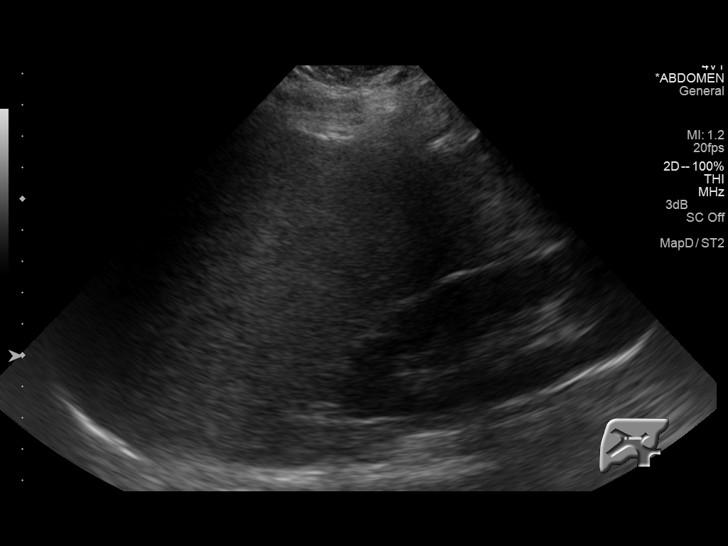
[im 51/51]
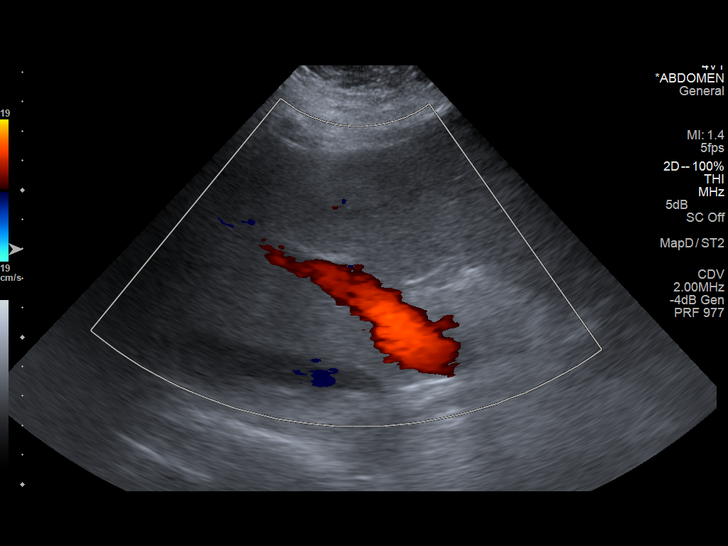

[14 of 25 positions shown; findings below may reference images not displayed]

FINDINGS: Gallbladder:

No stone, general wall thickening, or focal tenderness. There is
mild focal wasting of the gallbladder lumen which could reflect
focal adenomyomatosis as there is subtle ring down artifact.

Common bile duct:

Diameter: 5 mm

Liver:

Probable mild steatosis as there is geographic hypo echogenicity
around the gallbladder fossa. No focal mass is seen. Antegrade flow
in the imaged portal venous system.
IMPRESSION: No explanation for acute abdominal pain.

## 2016-11-17 ENCOUNTER — Ambulatory Visit (INDEPENDENT_AMBULATORY_CARE_PROVIDER_SITE_OTHER): Payer: Medicare Other | Admitting: Orthopedic Surgery

## 2016-11-17 DIAGNOSIS — Z96652 Presence of left artificial knee joint: Secondary | ICD-10-CM

## 2016-11-17 MED ORDER — HYDROCODONE-ACETAMINOPHEN 5-325 MG PO TABS: 1.0000 | ORAL_TABLET | Freq: Two times a day (BID) | ORAL | 0 refills | Status: DC | PRN

## 2016-11-17 NOTE — Progress Notes (Signed)
Post-Op Visit Note   Patient: [name]           Date of Birth: [birth year]           MRN: [id number] Visit Date: 11/17/2016 PCP: Velna Hatchet, MD   Assessment & Plan:  Chief Complaint:  Chief Complaint  Patient presents with  . Left Knee - Routine Post Op   Visit Diagnoses: No diagnosis found.  Plan: [name] to have months out left total knee replacement.  Therapy note is reviewed.  She has range of motion 0-105 passively.  She is able to do straight leg raises.  Using a cane occasionally for granulation.  Plan is to refill Norco continue therapy 1 time a week for the next 4-6 weeks.  I'll see her back in 6 weeks for final check.  Follow-Up Instructions: Return in about 6 weeks (around 12/29/2016).   Orders:  No orders of the defined types were placed in this encounter.  No orders of the defined types were placed in this encounter.   Imaging: No results found.  PMFS History: Patient Active Problem List   Diagnosis Date Noted  . 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 Presence of left artificial knee joint 09/20/2016  . Arthritis of knee 08/22/2016  . Cervical radiculopathy 07/26/2016  . Cervical disc disorder with radiculopathy 07/26/2016  . Right arm pain 06/29/2016  . Cervicalgia 06/29/2016  . Primary osteoarthritis of left knee 06/29/2016  . Hypersomnia with sleep apnea 11/18/2015  . Lethargy 11/18/2015  . Chest pain 09/30/2014  . Abnormal cardiac function test 09/29/2014  . Chest pain with moderate risk for cardiac etiology 09/28/2014  . HTN (hypertension) 09/28/2014  . Hypokalemia   . Paresthesia 06/18/2014  . Numbness and tingling of left arm and leg   . Unstable angina ([place]) 05/24/2014  . OSA on CPAP 05/24/2014  . Diabetes mellitus type 2 in obese ([place]) 10/29/2012  . S/P laparoscopic appendectomy 06/04/2012  . Diastolic CHF ([place]) 24/26/8341  . Mediastinal mass 01/09/2012  . SOB (shortness of breath) 06/20/2011  . Mediastinal  abnormality 06/20/2011  . Dyslipidemia 12/23/2009  . GLAUCOMA 12/23/2009  . ARTHRITIS 12/23/2009  . 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 Morbid obesity-BMI 45 09/13/2006  . ANXIETY STATE NOS 09/13/2006  . DISORDER, DEPRESSIVE NEC 09/13/2006  . CARPAL TUNNEL SYNDROME, MILD 09/13/2006  . Unspecified essential hypertension 09/13/2006  . IBS 09/13/2006  . DEGENERATION, LUMBAR/LUMBOSACRAL DISC 09/13/2006  . SYMPTOM, SWELLING/MASS/LUMP IN CHEST 09/13/2006  . PULMONARY EMBOLISM, HX OF 09/13/2006   Past Medical History:  Diagnosis Date  . Anginal pain ([place])    a. NL cath in 2008;  b. Myoview 03/2011: dec uptake along mid anterior wall on stress imaging -> ? attenuation vs. ischemia, EF 65%;  c. Echo 04/2011: EF 55-60%, no RWMA, Gr 2 dd  . Anxiety   . Arthritis   . Asthma   . Bone cancer ([place])   . Cancer ([place])   . CHF (congestive heart failure) ([place])   . CHF (congestive heart failure) ([place])   . Depression   . Diabetes mellitus   . Dyspnea    with exertion  . Frequent urination   . GERD (gastroesophageal reflux disease)   . Headache   . Hypertension   . 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 Obesity   . Pulmonary edema   . Pulmonary embolism ([place])    a. 2008 -> coumadin x 6 mos.  [name]  Sleep apnea    does not use CPAP  . Urinary urgency     Family History  Problem Relation Age of Onset  . Emphysema Mother   . Arthritis Mother   . Heart failure Mother        alive @ 69  . Stroke Mother   . Asthma Brother   . Heart disease Father        died @ 16's.  . Stroke Father   . Diabetes Sister   . Heart disease Paternal Grandfather   . Colon cancer Maternal Grandfather   . Breast cancer Maternal Aunt     Past Surgical History:  Procedure Laterality Date  . ABDOMINAL HYSTERECTOMY  2005  . CARDIAC CATHETERIZATION     Normal  . CARDIAC CATHETERIZATION N/A 09/30/2014   Procedure: Left Heart Cath and Coronary Angiography;  Surgeon: Sherren Mocha, MD;  Location: [facility];  Service: Cardiovascular;  Laterality: N/A;  . LAPAROSCOPIC APPENDECTOMY N/A 06/03/2012   Procedure: APPENDECTOMY LAPAROSCOPIC;  Surgeon: Stark Klein, MD;  Location: [facility];  Service: General;  Laterality: N/A;  . Left knee surgery  2008  . LEG SURGERY    . TONSILLECTOMY    . TOTAL KNEE ARTHROPLASTY Left 08/22/2016   Procedure: TOTAL KNEE ARTHROPLASTY;  Surgeon: Meredith Pel, MD;  Location: [facility];  Service: Orthopedics;  Laterality: Left;  . TUBAL LIGATION  1989   Social History   Occupational History  . UNEMPLOYED Disabled   Social History Main Topics  . Smoking status: Former Smoker    Packs/day: 0.50    Years: 15.00    Types: Cigarettes    Quit date: 04/24/1985  . Smokeless tobacco: Never Used  . Alcohol use No  . Drug use: No  . Sexual activity: No

## 2016-11-20 ENCOUNTER — Ambulatory Visit: Payer: Medicare Other | Admitting: Neurology

## 2016-12-29 ENCOUNTER — Encounter (INDEPENDENT_AMBULATORY_CARE_PROVIDER_SITE_OTHER): Payer: Self-pay | Admitting: Orthopedic Surgery

## 2016-12-29 ENCOUNTER — Ambulatory Visit (INDEPENDENT_AMBULATORY_CARE_PROVIDER_SITE_OTHER): Payer: Medicare Other | Admitting: Orthopedic Surgery

## 2016-12-29 DIAGNOSIS — Z96652 Presence of left artificial knee joint: Secondary | ICD-10-CM | POA: Diagnosis not present

## 2016-12-29 NOTE — Progress Notes (Signed)
Office Visit Note   Patient: [name]           Date of Birth: 07/12/1960           MRN: [id number] Visit Date: 12/29/2016 Requested by: Velna Hatchet, MD [address] [place], [place] [address] PCP: Velna Hatchet, MD  Subjective: Chief Complaint  Patient presents with  . Left Knee - Follow-up    HPI: [name] is a patient who is now 4 months out left total knee replacement.  She's been doing well.  Walking well.  She has some ingrown toenails mostly on the right-hand side.  I talked with her extensively about the importance of making sure testing care of especially if it comes up on the left-hand side.  She has finished physical therapy and is doing a home exercise program.              ROS: All systems reviewed are negative as they relate to the chief complaint within the history of present illness.  Patient denies  fevers or chills.   Assessment & Plan: Visit Diagnoses: No diagnosis found.  Plan: Impression is well-functioning left total knee replacement.  Plan is MRI prophylaxis before any procedure dental are otherwise for at least the first year postop.  I do want her to make sure that the toenails especially on the left-hand side are taking care of.  I will see her back as needed.  Follow-Up Instructions: Return if symptoms worsen or fail to improve.   Orders:  No orders of the defined types were placed in this encounter.  No orders of the defined types were placed in this encounter.     Procedures: No procedures performed   Clinical Data: No additional findings.  Objective: Vital Signs: There were no vitals taken for this visit.  Physical Exam:   Constitutional: Patient appears well-developed HEENT:  Head: Normocephalic Eyes:EOM are normal Neck: Normal range of motion Cardiovascular: Normal rate Pulmonary/chest: Effort normal Neurologic: Patient is alert Skin: Skin is warm Psychiatric: Patient has normal mood and affect    Ortho Exam:  Orthopedic exam demonstrates excellent range of motion of the left total knee from 0-115 of flexion.  There is no effusion.  Pedal pulses are intact.  None of her toenails are ingrown on the left-hand side.  No groin pain with internal/external rotation of the leg.  Specialty Comments:  No specialty comments available.  Imaging: No results found.   PMFS History: Patient Active Problem List   Diagnosis Date Noted  . 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 Presence of left artificial knee joint 09/20/2016  . Arthritis of knee 08/22/2016  . Cervical radiculopathy 07/26/2016  . Cervical disc disorder with radiculopathy 07/26/2016  . Right arm pain 06/29/2016  . Cervicalgia 06/29/2016  . Primary osteoarthritis of left knee 06/29/2016  . Hypersomnia with sleep apnea 11/18/2015  . Lethargy 11/18/2015  . Chest pain 09/30/2014  . Abnormal cardiac function test 09/29/2014  . Chest pain with moderate risk for cardiac etiology 09/28/2014  . HTN (hypertension) 09/28/2014  . Hypokalemia   . Paresthesia 06/18/2014  . Numbness and tingling of left arm and leg   . Unstable angina ([place]) 05/24/2014  . OSA on CPAP 05/24/2014  . Diabetes mellitus type 2 in obese ([place]) 10/29/2012  . S/P laparoscopic appendectomy 06/04/2012  . Diastolic CHF ([place]) 76/16/0737  . Mediastinal mass 01/09/2012  . SOB (shortness of breath) 06/20/2011  . Mediastinal abnormality 06/20/2011  . Dyslipidemia 12/23/2009  .  GLAUCOMA 12/23/2009  . ARTHRITIS 12/23/2009  . 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 Morbid obesity-BMI 45 09/13/2006  . ANXIETY STATE NOS 09/13/2006  . DISORDER, DEPRESSIVE NEC 09/13/2006  . CARPAL TUNNEL SYNDROME, MILD 09/13/2006  . Unspecified essential hypertension 09/13/2006  . IBS 09/13/2006  . DEGENERATION, LUMBAR/LUMBOSACRAL DISC 09/13/2006  . SYMPTOM, SWELLING/MASS/LUMP IN CHEST 09/13/2006  . PULMONARY EMBOLISM, HX OF 09/13/2006   Past Medical History:  Diagnosis Date  . Anginal pain ([place])     a. NL cath in 2008;  b. Myoview 03/2011: dec uptake along mid anterior wall on stress imaging -> ? attenuation vs. ischemia, EF 65%;  c. Echo 04/2011: EF 55-60%, no RWMA, Gr 2 dd  . Anxiety   . Arthritis   . Asthma   . Bone cancer ([place])   . Cancer ([place])   . CHF (congestive heart failure) ([place])   . CHF (congestive heart failure) ([place])   . Depression   . Diabetes mellitus   . Dyspnea    with exertion  . Frequent urination   . GERD (gastroesophageal reflux disease)   . Headache   . Hypertension   . 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 Obesity   . Pulmonary edema   . Pulmonary embolism ([place])    a. 2008 -> coumadin x 6 mos.  . Sleep apnea    does not use CPAP  . Urinary urgency     Family History  Problem Relation Age of Onset  . Emphysema Mother   . Arthritis Mother   . Heart failure Mother        alive @ 46  . Stroke Mother   . Asthma Brother   . Heart disease Father        died @ 54's.  . Stroke Father   . Diabetes Sister   . Heart disease Paternal Grandfather   . Colon cancer Maternal Grandfather   . Breast cancer Maternal Aunt     Past Surgical History:  Procedure Laterality Date  . ABDOMINAL HYSTERECTOMY  2005  . CARDIAC CATHETERIZATION     Normal  . CARDIAC CATHETERIZATION N/A 09/30/2014   Procedure: Left Heart Cath and Coronary Angiography;  Surgeon: Sherren Mocha, MD;  Location: [facility];  Service: Cardiovascular;  Laterality: N/A;  . LAPAROSCOPIC APPENDECTOMY N/A 06/03/2012   Procedure: APPENDECTOMY LAPAROSCOPIC;  Surgeon: Stark Klein, MD;  Location: [facility];  Service: General;  Laterality: N/A;  . Left knee surgery  2008  . LEG SURGERY    . TONSILLECTOMY    . TOTAL KNEE ARTHROPLASTY Left 08/22/2016   Procedure: TOTAL KNEE ARTHROPLASTY;  Surgeon: Meredith Pel, MD;  Location: [facility];  Service: Orthopedics;  Laterality: Left;  . TUBAL LIGATION  1989   Social History   Occupational History  . UNEMPLOYED Disabled   Social  History Main Topics  . Smoking status: Former Smoker    Packs/day: 0.50    Years: 15.00    Types: Cigarettes    Quit date: 04/24/1985  . Smokeless tobacco: Never Used  . Alcohol use No  . Drug use: No  . Sexual activity: No

## 2016-12-31 IMAGING — CT CT HEAD W/O CM
2 series · 16 of 30 positions shown, 20 images · non-contrast
Comparison: Head CT and brain MRI, 05/24/2014

CLINICAL DATA: Facial numbness on the right and extremity weakness.
Symptoms for 3 days.

EXAM:
CT HEAD WITHOUT CONTRAST
TECHNIQUE: Contiguous axial images were obtained from the base of the skull
through the vertex without intravenous contrast.

[Series 201: head w/o, idose (1) · axial · non-contrast · 0.44mm/px · z∈[+706,+826]mm · 13 of 30 slices shown, 17 images]
[im 3/30  brain]
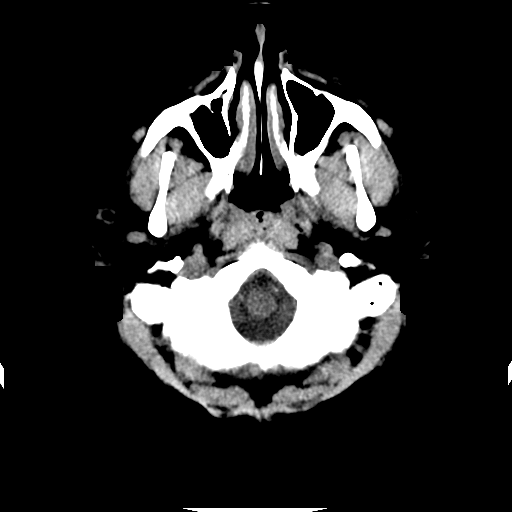
[im 3/30  bone]
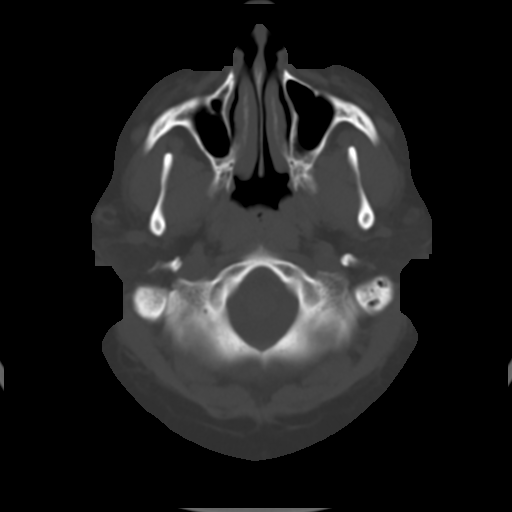
[im 5/30  brain]
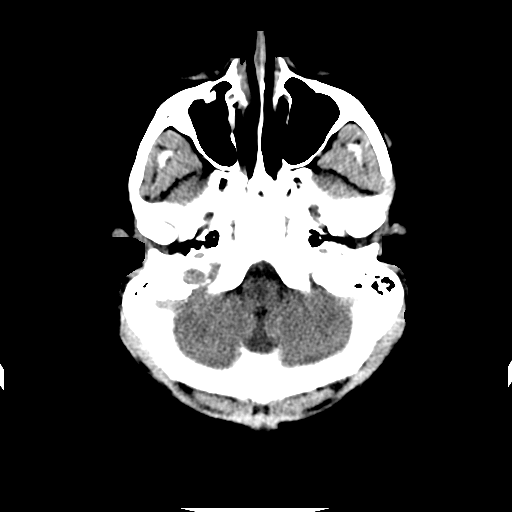
[im 7/30  brain]
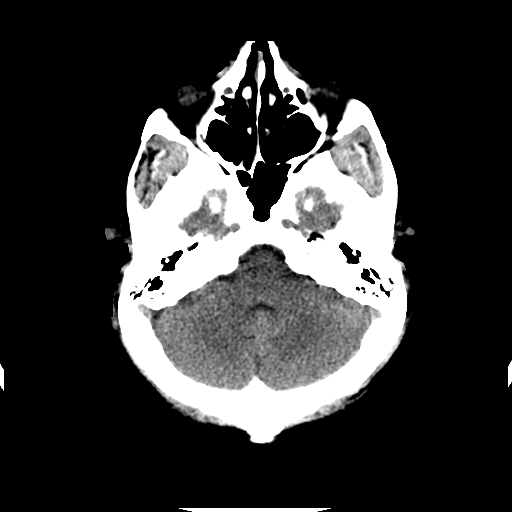
[im 9/30  brain]
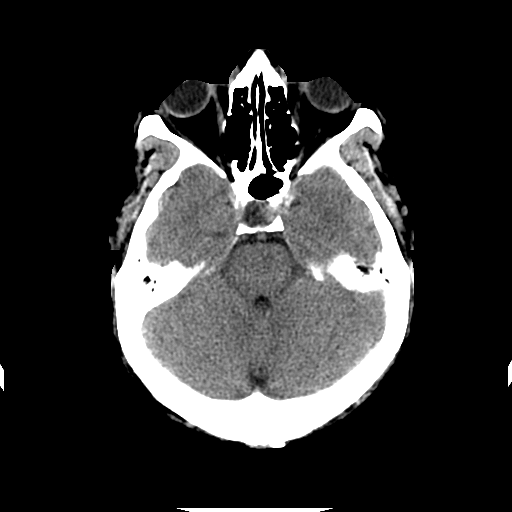
[im 11/30  brain]
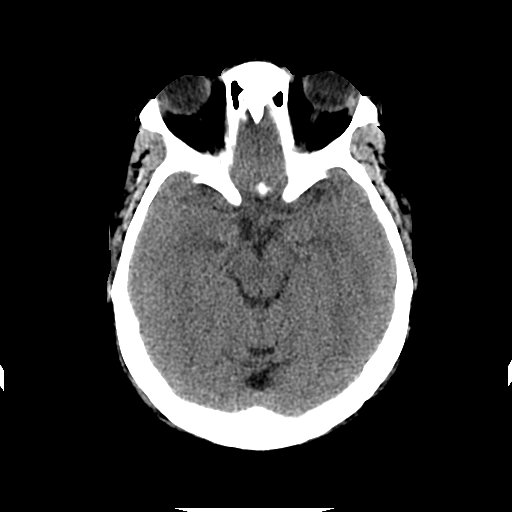
[im 11/30  bone]
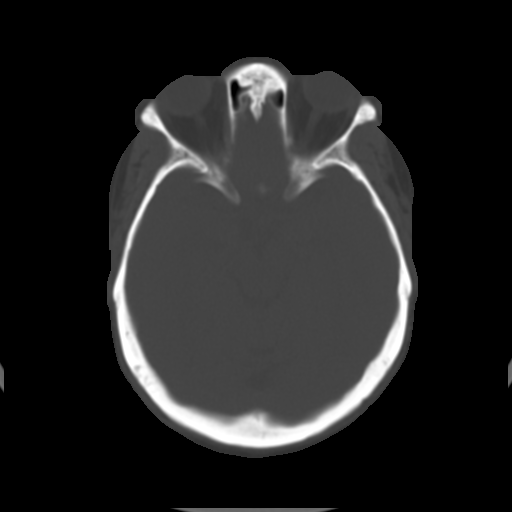
[im 13/30  brain]
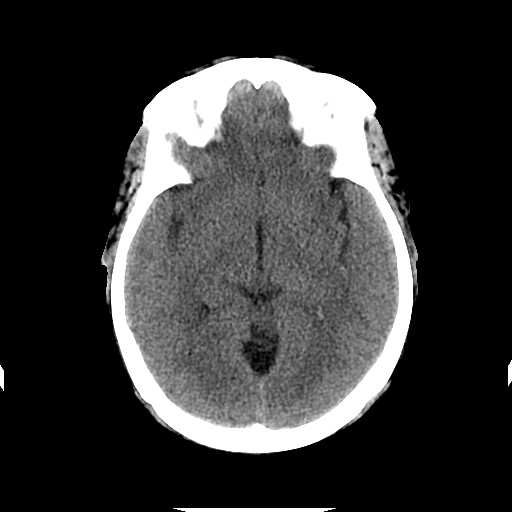
[im 15/30  brain]
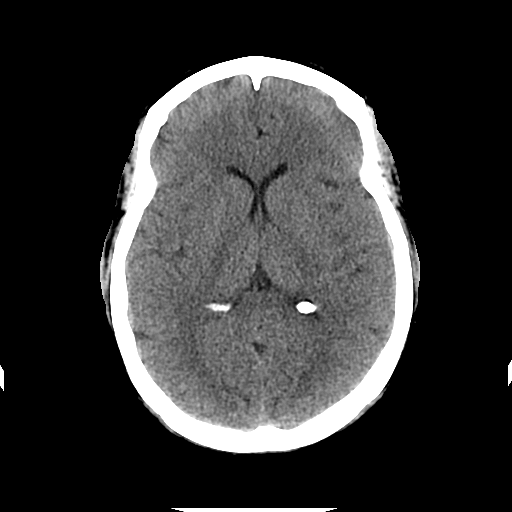
[im 17/30  brain]
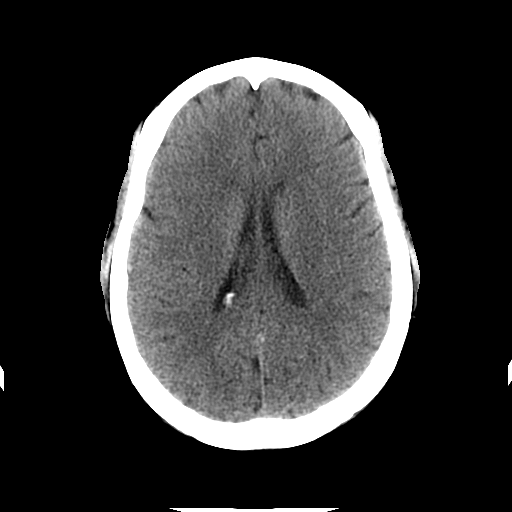
[im 19/30  brain]
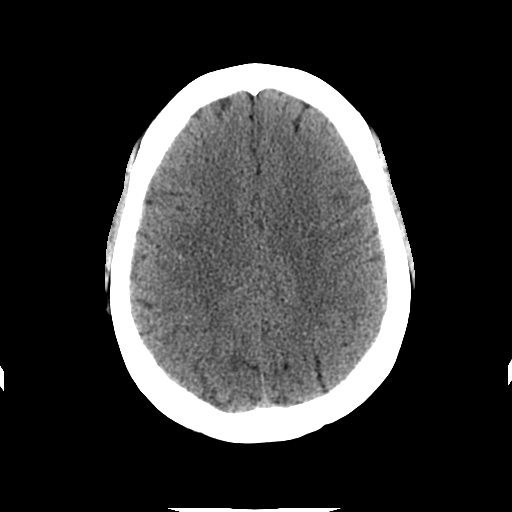
[im 19/30  bone]
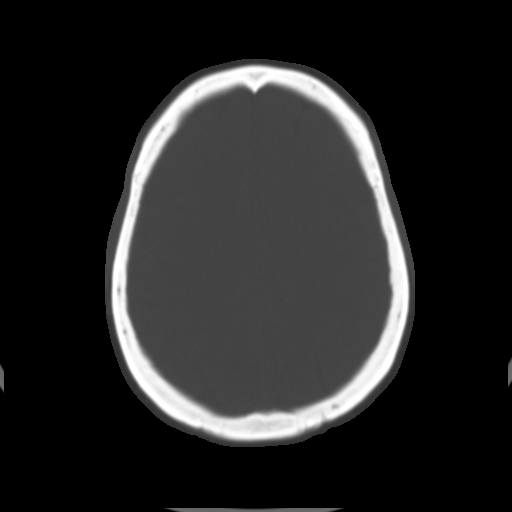
[im 21/30  brain]
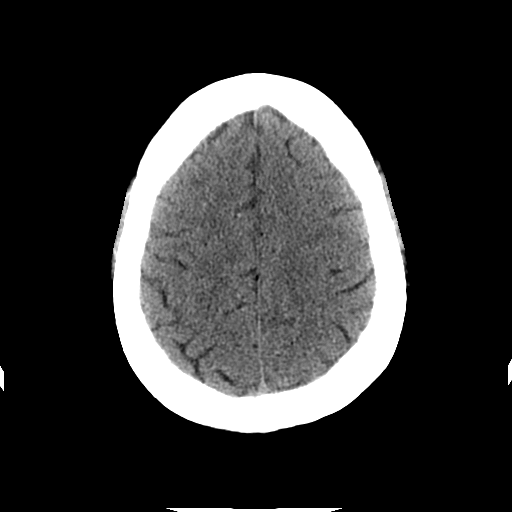
[im 23/30  brain]
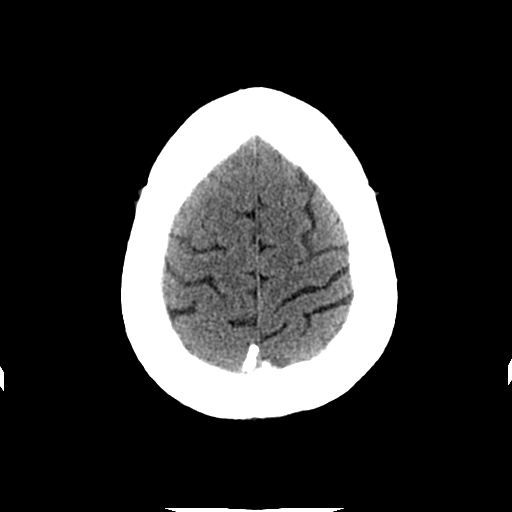
[im 25/30  brain]
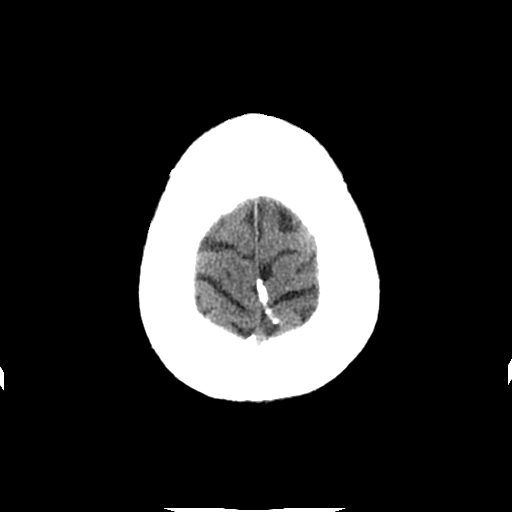
[im 27/30  brain]
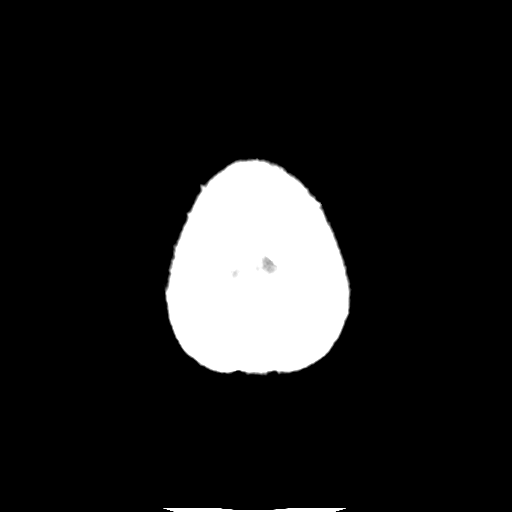
[im 27/30  bone]
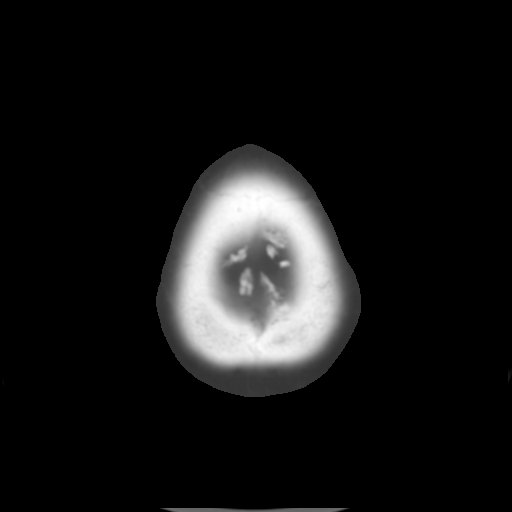

[Series 202: head w/o bone, idose (1) · axial · non-contrast · 0.44mm/px · z∈[+706,+746]mm · 3 of 30 slices shown]
[im 3/30  bone]
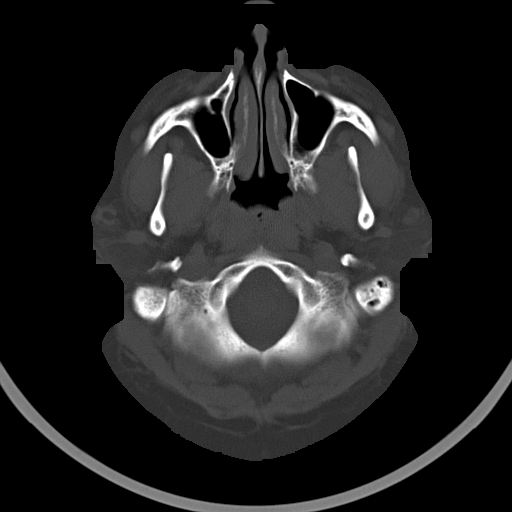
[im 7/30  bone]
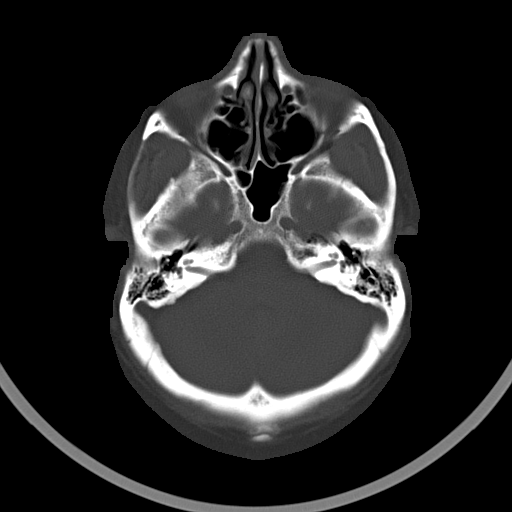
[im 11/30  bone]
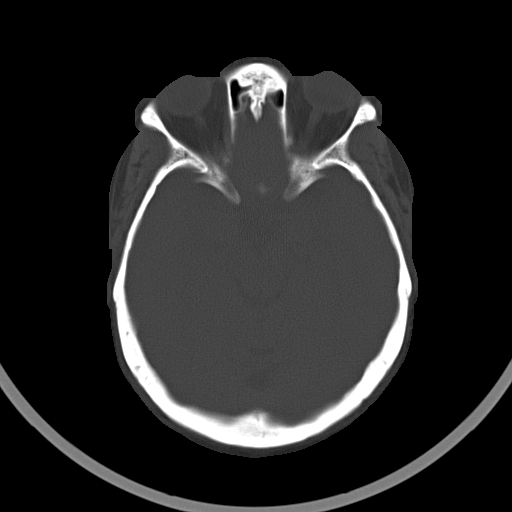

[16 of 30 positions shown; findings below may reference images not displayed]

FINDINGS: The ventricles are normal in size and configuration. There are no
parenchymal masses or mass effect. There is no evidence of an
infarct. There are no extra-axial masses or abnormal fluid
collections.

There is no intracranial hemorrhage.

Sinuses and mastoid air cells are clear.  No skull lesion.
IMPRESSION: Normal unenhanced CT scan of the brain.

## 2017-01-01 IMAGING — MR MR HEAD W/O CM
9 of 11 series · 33 of 48 positions shown · non-contrast
Comparison: CT head February 26, 2015 and MRI of the [facility]

CLINICAL DATA: 2-3 days of RIGHT-sided weakness and numbness,
decreased RIGHT facial and extremity sensation. Assess for stroke.
History of hypertension, diabetes, cancer.

EXAM:
MRI HEAD WITHOUT CONTRAST
TECHNIQUE: Multiplanar, multiecho pulse sequences of the brain and surrounding
structures were obtained without intravenous contrast.

[Series 2: FLAIR · sagittal · 5.0mm · 0.47mm/px · 3 of 25 slices shown (1 of 2)]
[im 1/25]
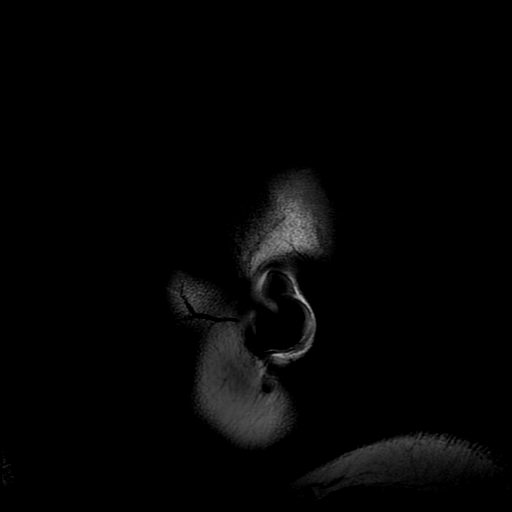
[im 13/25]
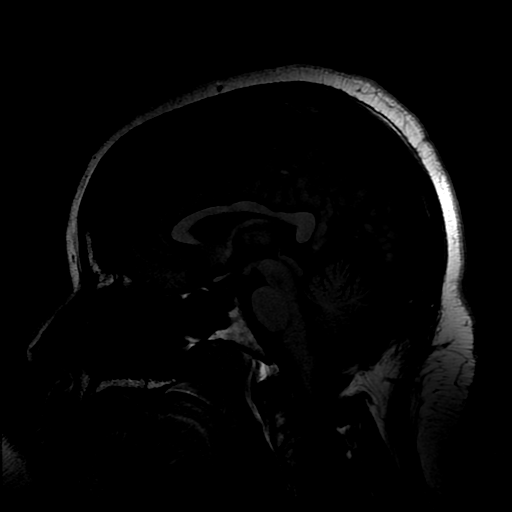
[im 25/25]
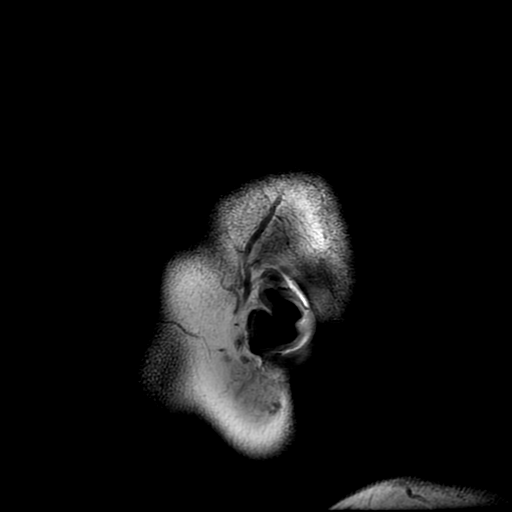

[Series 4: DWI · axial · 3.6mm · 0.94mm/px · z∈[-97,+46]mm · 8 of 82 slices shown (1 of 4)]
[im 1/82]
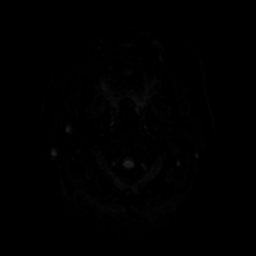
[im 12/82]
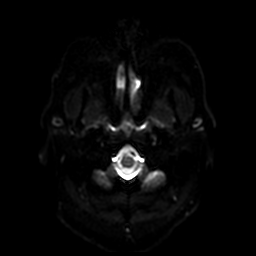
[im 24/82]
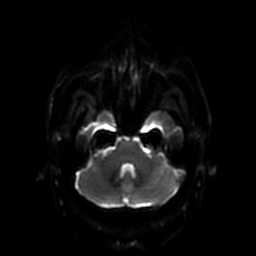
[im 35/82]
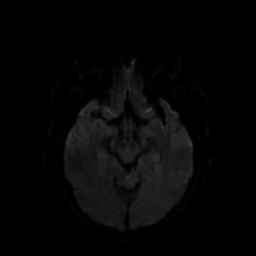
[im 47/82]
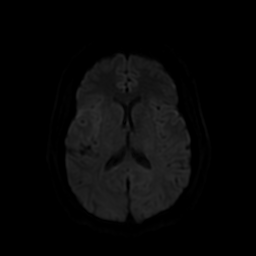
[im 58/82]
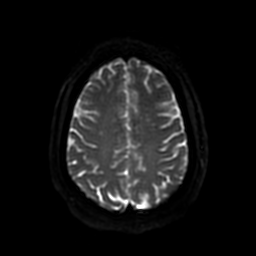
[im 70/82]
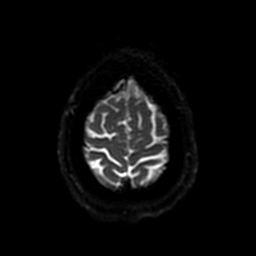
[im 82/82]
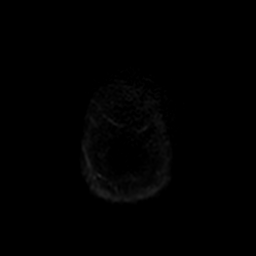

[Series 5: T2 · axial · 5.0mm · 0.47mm/px · z∈[-97,+46]mm · 2 of 25 slices shown (1 of 2)]
[im 1/25]
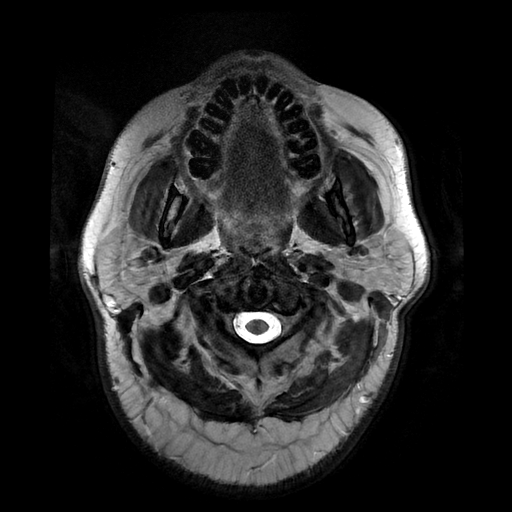
[im 25/25]
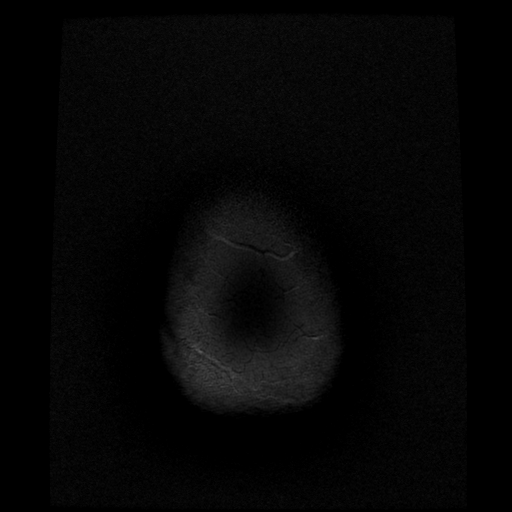

[Series 6: FLAIR · axial · 5.0mm · 0.47mm/px · z∈[-97,+46]mm · 2 of 25 slices shown (2 of 2)]
[im 1/25]
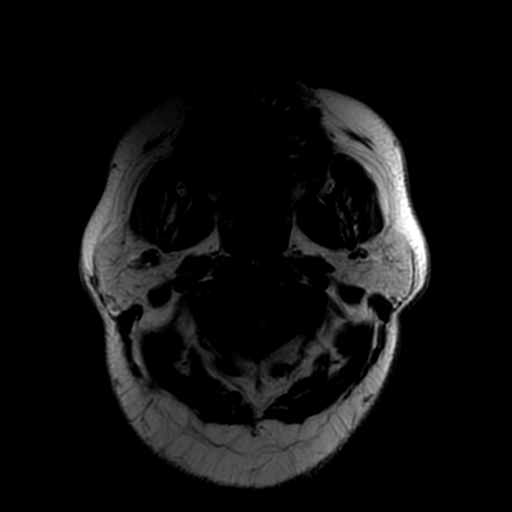
[im 25/25]
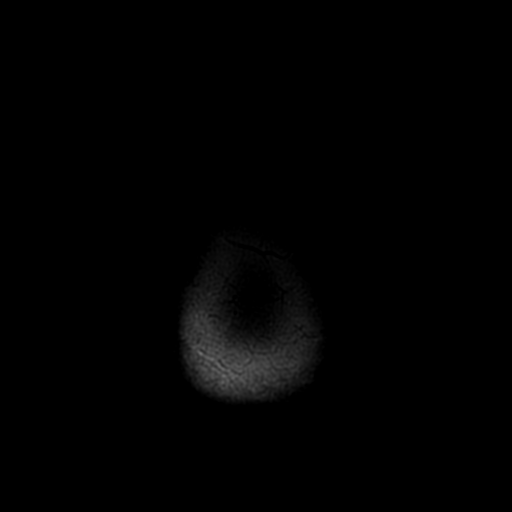

[Series 7: DWI · coronal · 5.0mm · 0.94mm/px · 6 of 66 slices shown (2 of 4)]
[im 1/66]
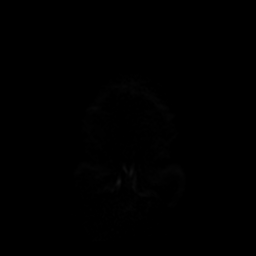
[im 14/66]
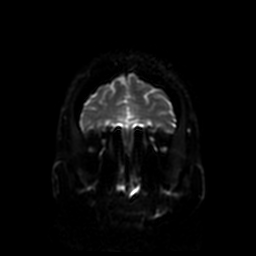
[im 27/66]
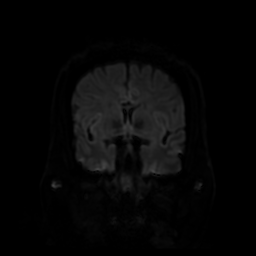
[im 40/66]
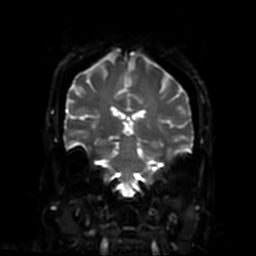
[im 53/66]
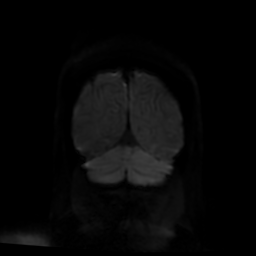
[im 66/66]
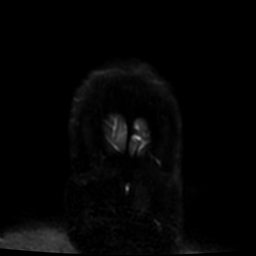

[Series 8: (person_name) · axial · 3.0mm · 0.47mm/px · z∈[-99,-82]mm · 2 of 100 slices shown]
[im 1/100]
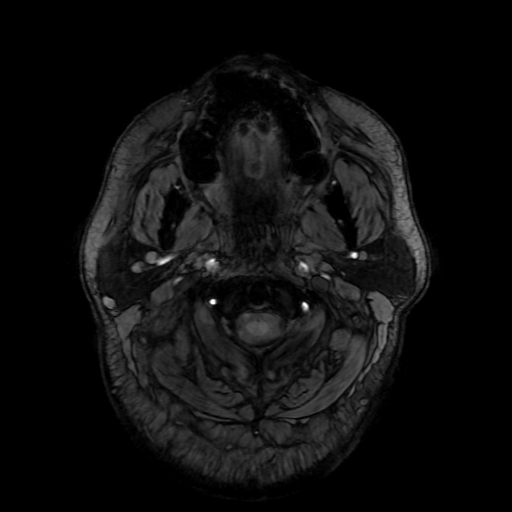
[im 12/100]
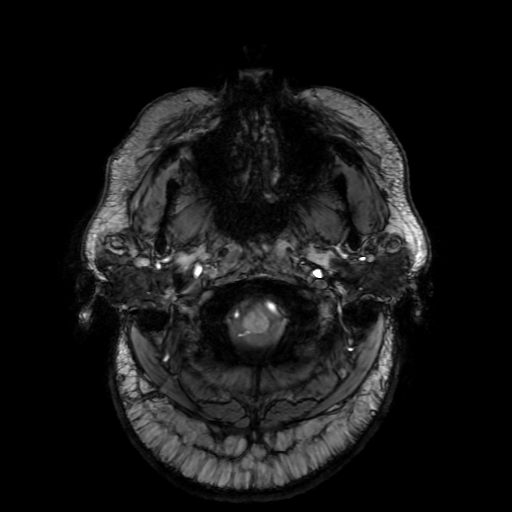

[Series 10: T2 · coronal · 5.0mm · 0.39mm/px · 3 of 28 slices shown (2 of 2)]
[im 1/28]
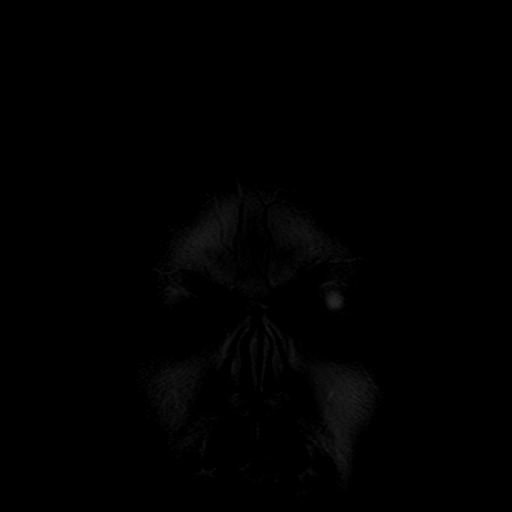
[im 14/28]
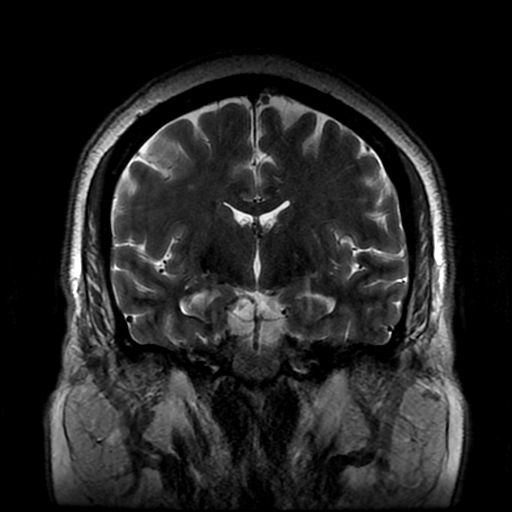
[im 28/28]
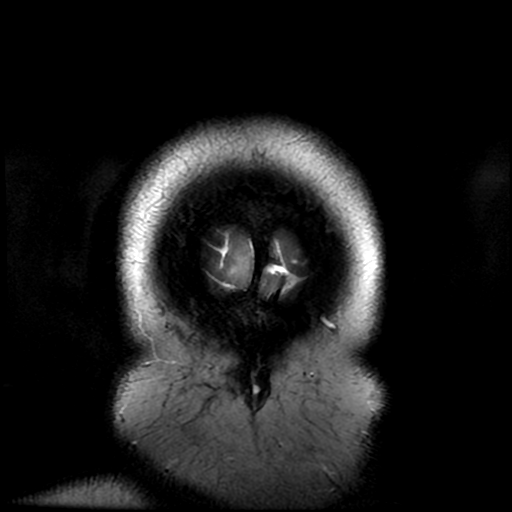

[Series 400: DWI · axial · 3.6mm · 0.94mm/px · z∈[-97,+46]mm · 4 of 40 slices shown (3 of 4)]
[im 1/40]
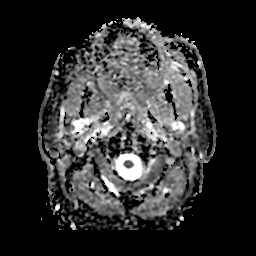
[im 14/40]
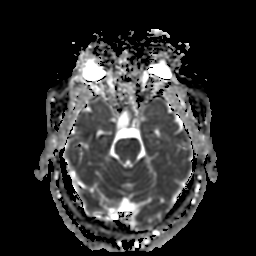
[im 27/40]
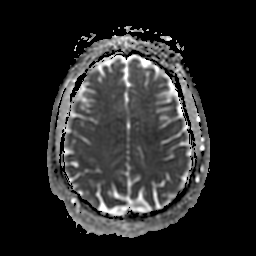
[im 40/40]
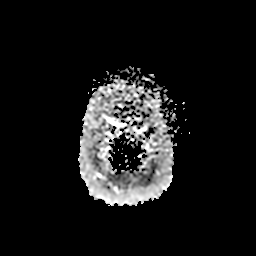

[Series 700: DWI · coronal · 5.0mm · 0.94mm/px · 3 of 33 slices shown (4 of 4)]
[im 1/33]
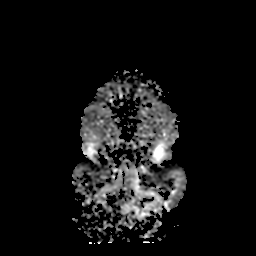
[im 17/33]
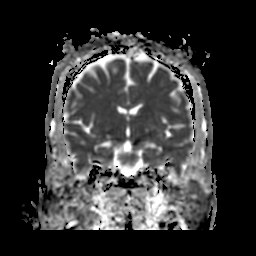
[im 33/33]
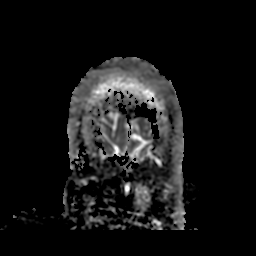

[33 of 48 positions shown; findings below may reference images not displayed]

FINDINGS: The ventricles and sulci are normal for patient's age. No abnormal
parenchymal signal, mass lesions, mass effect. No reduced diffusion
to suggest acute ischemia. No susceptibility artifact to suggest
hemorrhage. A few scattered subcentimeter supratentorial white
matter T2 hyperintensities are similar.

No abnormal extra-axial fluid collections. No extra-axial masses
though, contrast enhanced sequences would be more sensitive. Normal
major intracranial vascular flow voids seen at the skull base.

Ocular globes and orbital contents are unremarkable though not
tailored for evaluation. 10 mm T2 bright cystic RIGHT sellar/
suprasellar mass corresponding to known Rathke's cleft cyst .
Visualized paranasal sinuses and mastoid air cells are well-aerated.
No suspicious calvarial bone marrow signal. Craniocervical junction
maintained.
IMPRESSION: No acute intracranial process.

Mild stable white matter changes likely represent chronic small
vessel ischemic disease.

Stable 10 mm sellar/suprasellar cyst compatible with history of
Rathke's cleft cyst.

## 2017-02-07 IMAGING — CR DG CHEST 2V
2 series · 2 of 2 positions shown · non-contrast
Comparison: Chest x-ray 09/27/2014.

CLINICAL DATA: 54-year-old female with history of stroke gross of
breath, chest pain, nausea and diarrhea. Chills and cough.

EXAM:
CHEST  2 VIEW

[chest pa]
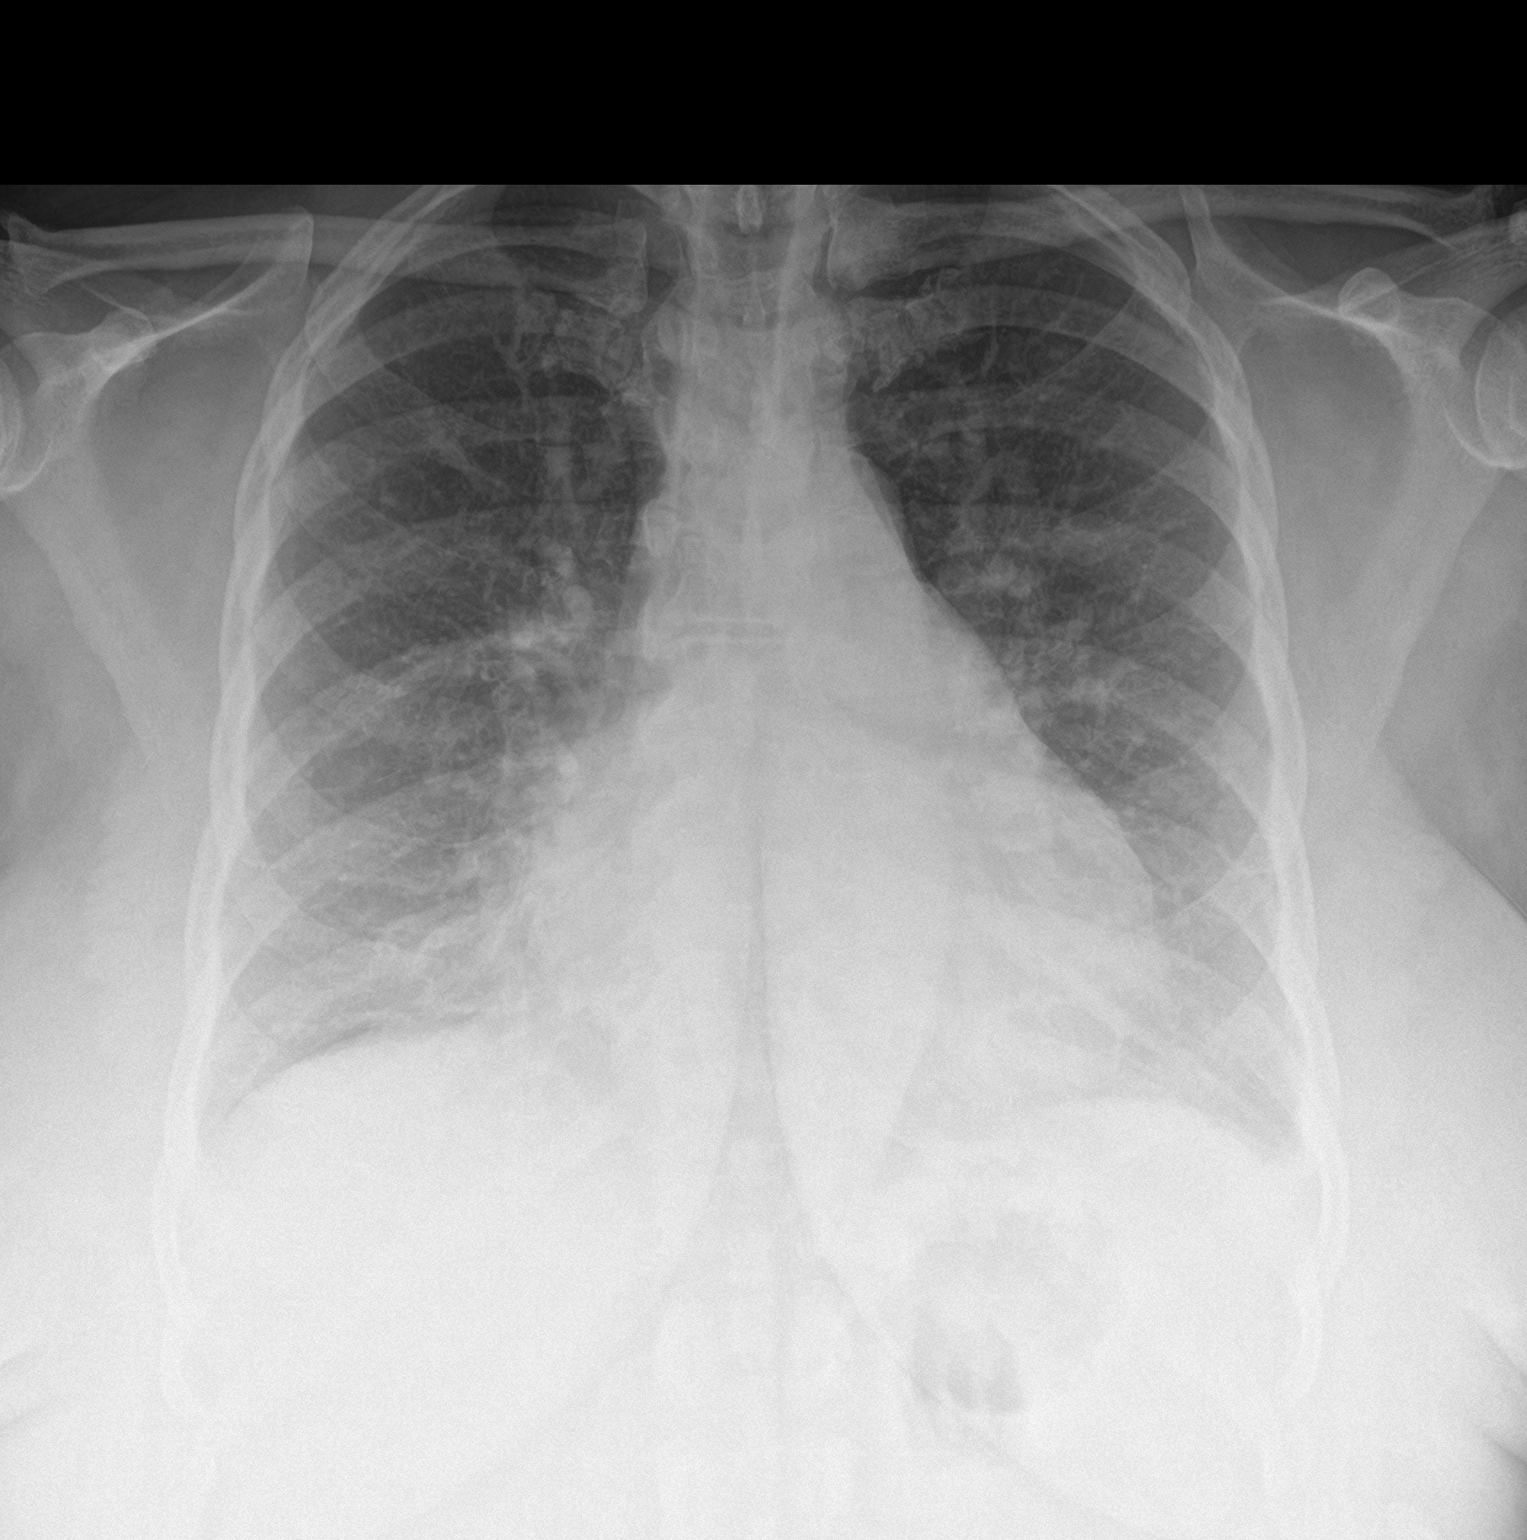

[chest lat]
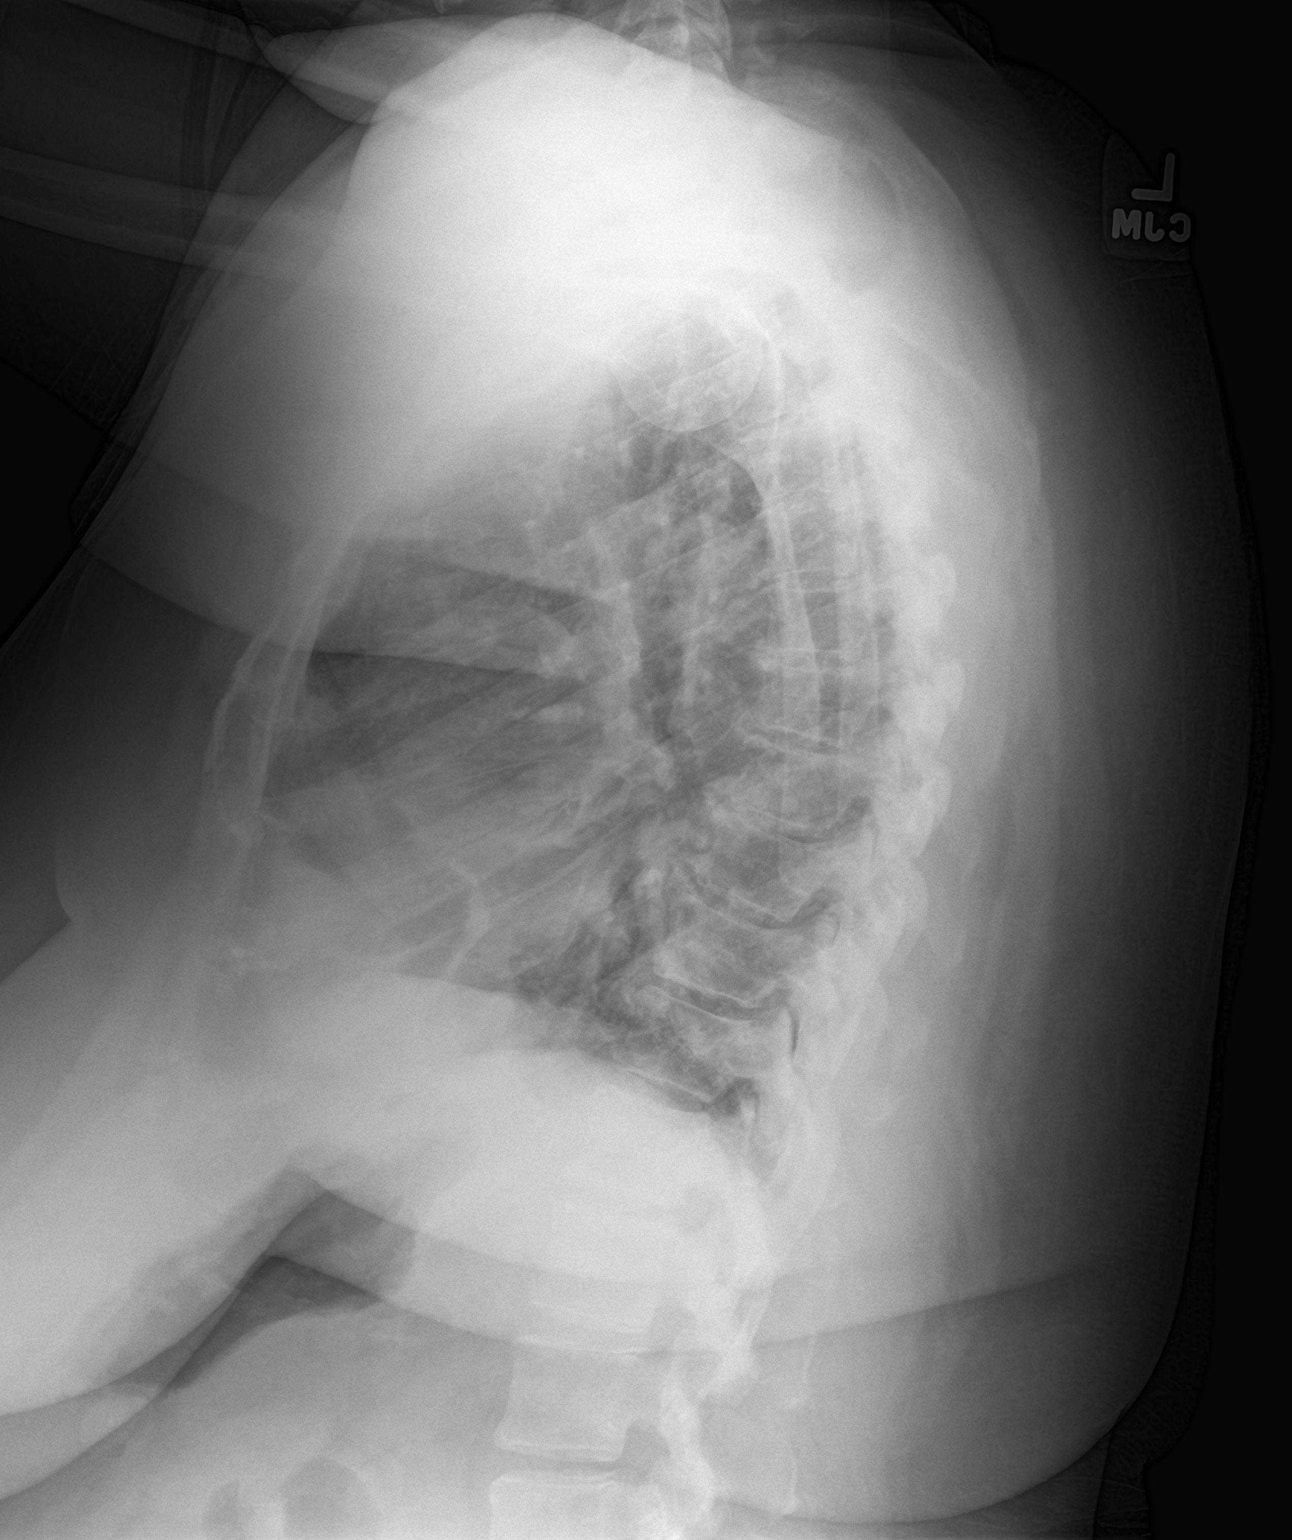

[2 of 2 positions shown; findings below may reference images not displayed]

FINDINGS: Mild diffuse peribronchial cuffing. Lung volumes are normal. No
consolidative airspace disease. No pleural effusions. No
pneumothorax. No pulmonary nodule or mass noted. Pulmonary
vasculature is normal. Heart size is borderline enlarged. Upper
mediastinal contours are within normal limits.
IMPRESSION: 1. Mild diffuse peribronchial cuffing, suggesting acute bronchitis.
2. Borderline cardiomegaly.

## 2017-03-27 ENCOUNTER — Encounter (HOSPITAL_COMMUNITY): Payer: Self-pay | Admitting: Emergency Medicine

## 2017-03-27 ENCOUNTER — Other Ambulatory Visit: Payer: Self-pay

## 2017-03-27 ENCOUNTER — Emergency Department (HOSPITAL_COMMUNITY)
Admission: EM | Admit: 2017-03-27 | Discharge: 2017-03-28 | Disposition: A | Discharge status: Home / Self Care | Payer: Medicare Other | Attending: Emergency Medicine | Admitting: Emergency Medicine

## 2017-03-27 ENCOUNTER — Emergency Department (HOSPITAL_COMMUNITY): Payer: Medicare Other

## 2017-03-27 DIAGNOSIS — R079 Chest pain, unspecified: Secondary | ICD-10-CM | POA: Diagnosis present

## 2017-03-27 DIAGNOSIS — Z5321 Procedure and treatment not carried out due to patient leaving prior to being seen by health care provider: Secondary | ICD-10-CM | POA: Insufficient documentation

## 2017-03-27 LAB — BASIC METABOLIC PANEL
Anion gap: 8 (ref 5–15)
BUN: 9 mg/dL (ref 6–20)
CO2: 29 mmol/L (ref 22–32)
Calcium: 9.4 mg/dL (ref 8.9–10.3)
Chloride: 105 mmol/L (ref 101–111)
Creatinine, Ser: 0.91 mg/dL (ref 0.44–1.00)
GFR calc Af Amer: >60 mL/min (ref >60)
GFR calc non Af Amer: >60 mL/min (ref >60)
Glucose, Bld: 106 mg/dL — ABNORMAL HIGH (ref 65–99)
Potassium: 3.5 mmol/L (ref 3.5–5.1)
Sodium: 142 mmol/L (ref 135–145)

## 2017-03-27 LAB — CBC
HCT: 39.6 % (ref 36.0–46.0)
Hemoglobin: 12.7 g/dL (ref 12.0–15.0)
MCH: 27.7 pg (ref 26.0–34.0)
MCHC: 32.1 g/dL (ref 30.0–36.0)
MCV: 86.5 fL (ref 78.0–100.0)
Platelets: 227 10*3/uL (ref 150–400)
RBC: 4.58 MIL/uL (ref 3.87–5.11)
RDW: 14.2 % (ref 11.5–15.5)
WBC: 7 10*3/uL (ref 4.0–10.5)

## 2017-03-27 LAB — I-STAT BETA HCG BLOOD, ED (MC, WL, AP ONLY): I-stat hCG, quantitative: <5 m[IU]/mL (ref <5)

## 2017-03-27 LAB — I-STAT TROPONIN, ED: Troponin i, poc: 0.03 ng/mL (ref 0.00–0.08)

## 2017-03-27 NOTE — ED Notes (Signed)
Pt decided to leave 

## 2017-03-27 NOTE — ED Notes (Signed)
Patient transported to X-ray 

## 2017-03-27 NOTE — ED Triage Notes (Signed)
Pt having some right side cp with arm pain since last Thursday, no nausea or vomiting.

## 2017-06-10 ENCOUNTER — Encounter (HOSPITAL_COMMUNITY): Payer: Self-pay | Admitting: Emergency Medicine

## 2017-06-10 ENCOUNTER — Other Ambulatory Visit: Payer: Self-pay

## 2017-06-10 ENCOUNTER — Emergency Department (HOSPITAL_COMMUNITY): Payer: Medicare Other

## 2017-06-10 DIAGNOSIS — J45909 Unspecified asthma, uncomplicated: Secondary | ICD-10-CM | POA: Diagnosis not present

## 2017-06-10 DIAGNOSIS — Z96652 Presence of left artificial knee joint: Secondary | ICD-10-CM | POA: Diagnosis not present

## 2017-06-10 DIAGNOSIS — Z7982 Long term (current) use of aspirin: Secondary | ICD-10-CM | POA: Insufficient documentation

## 2017-06-10 DIAGNOSIS — I5030 Unspecified diastolic (congestive) heart failure: Secondary | ICD-10-CM | POA: Diagnosis not present

## 2017-06-10 DIAGNOSIS — Z87891 Personal history of nicotine dependence: Secondary | ICD-10-CM | POA: Insufficient documentation

## 2017-06-10 DIAGNOSIS — E119 Type 2 diabetes mellitus without complications: Secondary | ICD-10-CM | POA: Insufficient documentation

## 2017-06-10 DIAGNOSIS — Z79899 Other long term (current) drug therapy: Secondary | ICD-10-CM | POA: Insufficient documentation

## 2017-06-10 DIAGNOSIS — R42 Dizziness and giddiness: Secondary | ICD-10-CM | POA: Insufficient documentation

## 2017-06-10 DIAGNOSIS — R51 Headache: Secondary | ICD-10-CM | POA: Diagnosis present

## 2017-06-10 DIAGNOSIS — Z91040 Latex allergy status: Secondary | ICD-10-CM | POA: Insufficient documentation

## 2017-06-10 DIAGNOSIS — Z85830 Personal history of malignant neoplasm of bone: Secondary | ICD-10-CM | POA: Diagnosis not present

## 2017-06-10 DIAGNOSIS — I110 Hypertensive heart disease with heart failure: Secondary | ICD-10-CM | POA: Insufficient documentation

## 2017-06-10 DIAGNOSIS — Z7984 Long term (current) use of oral hypoglycemic drugs: Secondary | ICD-10-CM | POA: Insufficient documentation

## 2017-06-10 LAB — CBC
HCT: 41.7 % (ref 36.0–46.0)
Hemoglobin: 13.2 g/dL (ref 12.0–15.0)
MCH: 27.6 pg (ref 26.0–34.0)
MCHC: 31.7 g/dL (ref 30.0–36.0)
MCV: 87.2 fL (ref 78.0–100.0)
Platelets: 251 10*3/uL (ref 150–400)
RBC: 4.78 MIL/uL (ref 3.87–5.11)
RDW: 14.7 % (ref 11.5–15.5)
WBC: 7.7 10*3/uL (ref 4.0–10.5)

## 2017-06-10 LAB — BASIC METABOLIC PANEL
Anion gap: 10 (ref 5–15)
BUN: 14 mg/dL (ref 6–20)
CO2: 24 mmol/L (ref 22–32)
Calcium: 9.7 mg/dL (ref 8.9–10.3)
Chloride: 104 mmol/L (ref 101–111)
Creatinine, Ser: 0.93 mg/dL (ref 0.44–1.00)
GFR calc Af Amer: >60 mL/min (ref >60)
GFR calc non Af Amer: >60 mL/min (ref >60)
Glucose, Bld: 96 mg/dL (ref 65–99)
Potassium: 3.7 mmol/L (ref 3.5–5.1)
Sodium: 138 mmol/L (ref 135–145)

## 2017-06-10 LAB — I-STAT TROPONIN, ED: Troponin i, poc: 0.03 ng/mL (ref 0.00–0.08)

## 2017-06-10 LAB — I-STAT BETA HCG BLOOD, ED (MC, WL, AP ONLY): I-stat hCG, quantitative: <5 m[IU]/mL (ref <5)

## 2017-06-10 NOTE — ED Triage Notes (Signed)
Pt c/o HA x 1 week intermittent, pt states PCP added additional HTN medication Friday but pain continues to worsen with HA in the back of her head. Pt states he does have known cyst, unkn last head CT date. Pt reports worsening fatigue last few days. Pt reports central chest discomfort through to back onset yesterday, +nausea.  Pt states she had episode of extreme HTN last Sunday at church when vision became "wobbly" since that time headache has increased.  Lucianne Lei neg

## 2017-06-11 ENCOUNTER — Emergency Department (HOSPITAL_COMMUNITY): Payer: Medicare Other

## 2017-06-11 ENCOUNTER — Emergency Department (HOSPITAL_COMMUNITY)
Admission: EM | Admit: 2017-06-11 | Discharge: 2017-06-11 | Disposition: A | Discharge status: Home / Self Care | Payer: Medicare Other | Attending: Emergency Medicine | Admitting: Emergency Medicine

## 2017-06-11 DIAGNOSIS — I110 Hypertensive heart disease with heart failure: Secondary | ICD-10-CM | POA: Diagnosis not present

## 2017-06-11 DIAGNOSIS — R519 Headache, unspecified: Secondary | ICD-10-CM

## 2017-06-11 DIAGNOSIS — I10 Essential (primary) hypertension: Secondary | ICD-10-CM

## 2017-06-11 DIAGNOSIS — R51 Headache: Secondary | ICD-10-CM

## 2017-06-11 LAB — I-STAT TROPONIN, ED: Troponin i, poc: 0.01 ng/mL (ref 0.00–0.08)

## 2017-06-11 MED ORDER — LORAZEPAM 0.5 MG PO TABS: 0.5000 mg | ORAL_TABLET | Freq: Once | ORAL | Status: DC

## 2017-06-11 MED ORDER — KETOROLAC TROMETHAMINE 30 MG/ML IJ SOLN
30.0000 mg | Freq: Once | INTRAMUSCULAR | Status: AC
  Administered 2017-06-11: 30 mg via INTRAVENOUS
  Filled 2017-06-11: qty 1

## 2017-06-11 MED ORDER — SODIUM CHLORIDE 0.9 % IV BOLUS (SEPSIS)
1000.0000 mL | Freq: Once | INTRAVENOUS | Status: AC
  Administered 2017-06-11: 1000 mL via INTRAVENOUS

## 2017-06-11 MED ORDER — CARVEDILOL PHOSPHATE ER 10 MG PO CP24
10.0000 mg | ORAL_CAPSULE | Freq: Every day | ORAL | Status: DC
  Filled 2017-06-11: qty 1

## 2017-06-11 MED ORDER — AMLODIPINE BESYLATE 5 MG PO TABS
5.0000 mg | ORAL_TABLET | Freq: Once | ORAL | Status: AC
  Administered 2017-06-11: 5 mg via ORAL
  Filled 2017-06-11: qty 1

## 2017-06-11 MED ORDER — LORAZEPAM 2 MG/ML IJ SOLN
0.5000 mg | Freq: Once | INTRAMUSCULAR | Status: AC
Start: 2017-06-11 — End: 2017-06-11
  Administered 2017-06-11: 0.5 mg via INTRAMUSCULAR
  Filled 2017-06-11: qty 1

## 2017-06-11 NOTE — ED Notes (Signed)
Pt arrives back from MRI.  

## 2017-06-11 NOTE — ED Notes (Signed)
Pt getting dressed waiting on a ride.

## 2017-06-11 NOTE — ED Provider Notes (Signed)
[facility]ERGENCY DEPARTMENT Provider Note   CSN: [id number] Arrival date & time: 06/10/17  2206     History   Chief Complaint Chief Complaint  Patient presents with  . Headache    HPI [name] is a 57 y.o. female with history of hypertension, DM, arthritis, CHF, CHF presents today for evaluation of gradual onset, intermittent headaches for the past week.  She states that her symptoms began last Saturday, 9 days ago.  She notes that she has been experiencing at least 6 episodes of headaches, sometimes frontal, sometimes occipital that are throbbing in nature and do not radiate.  No aggravating or alleviating factors noted.  No photophobia or phonophobia noted.  She notes blurry vision in the bilateral eyes constantly for the past week.  Endorses subjective numbness and "heaviness "of the left upper and lower extremities which has been constant for the last 3 days.  She notes generalized fatigue.  She states that last Sunday 8 days ago when she was in church she experienced acute onset of lightheadedness.  EMS was called and she was found to be hypertensive with blood pressure 210/109.  She states her baseline blood pressure is usually around 130/90 but for the past week she has been hypertensive with average readings 170/100.  She states that on Friday 3 days ago she called her primary care physician for follow-up but was told that they were full.  She states that they started her on amlodipine 5 mg daily but states that has not improved her blood pressure.   She states that yesterday at around 10 AM she awoke with right-sided sharp stabbing chest pain which radiates to the back.  She states this pain is intermittent, nonexertional, no aggravating or alleviating factors noted.  She has not tried anything for her headaches or her chest pain.  She denies any worsening shortness of chronic DOE.  No leg swelling or palpitations.  She denies fevers or chills.  The  history is provided by the patient.    Past Medical History:  Diagnosis Date  . Anginal pain ([place])    a. NL cath in 2008;  b. Myoview 03/2011: dec uptake along mid anterior wall on stress imaging -> ? attenuation vs. ischemia, EF 65%;  c. Echo 04/2011: EF 55-60%, no RWMA, Gr 2 dd  . Anxiety   . Arthritis   . Asthma   . Bone cancer ([place])   . Cancer ([place])   . CHF (congestive heart failure) ([place])   . CHF (congestive heart failure) ([place])   . Depression   . Diabetes mellitus   . Dyspnea    with exertion  . Frequent urination   . GERD (gastroesophageal reflux disease)   . Headache   . Hypertension   . 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 Obesity   . Pulmonary edema   . Pulmonary embolism ([place])    a. 2008 -> coumadin x 6 mos.  . Sleep apnea    does not use CPAP  . Urinary urgency     Patient Active Problem List   Diagnosis Date Noted  . 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 Presence of left artificial knee joint 09/20/2016  . Arthritis of knee 08/22/2016  . Cervical radiculopathy 07/26/2016  . Cervical disc disorder with radiculopathy 07/26/2016  . Right arm pain 06/29/2016  . Cervicalgia 06/29/2016  . Primary osteoarthritis of left knee 06/29/2016  . Hypersomnia with sleep  apnea 11/18/2015  . Lethargy 11/18/2015  . Chest pain 09/30/2014  . Abnormal cardiac function test 09/29/2014  . Chest pain with moderate risk for cardiac etiology 09/28/2014  . HTN (hypertension) 09/28/2014  . Hypokalemia   . Paresthesia 06/18/2014  . Numbness and tingling of left arm and leg   . Unstable angina ([place]) 05/24/2014  . OSA on CPAP 05/24/2014  . Diabetes mellitus type 2 in obese ([place]) 10/29/2012  . S/P laparoscopic appendectomy 06/04/2012  . Diastolic CHF ([place]) 28/31/5176  . Mediastinal mass 01/09/2012  . SOB (shortness of breath) 06/20/2011  . Mediastinal abnormality 06/20/2011  . Dyslipidemia 12/23/2009  . GLAUCOMA 12/23/2009  . ARTHRITIS 12/23/2009  .  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 Morbid obesity-BMI 45 09/13/2006  . ANXIETY STATE NOS 09/13/2006  . DISORDER, DEPRESSIVE NEC 09/13/2006  . CARPAL TUNNEL SYNDROME, MILD 09/13/2006  . Unspecified essential hypertension 09/13/2006  . IBS 09/13/2006  . DEGENERATION, LUMBAR/LUMBOSACRAL DISC 09/13/2006  . SYMPTOM, SWELLING/MASS/LUMP IN CHEST 09/13/2006  . PULMONARY EMBOLISM, HX OF 09/13/2006    Past Surgical History:  Procedure Laterality Date  . ABDOMINAL HYSTERECTOMY  2005  . CARDIAC CATHETERIZATION     Normal  . CARDIAC CATHETERIZATION N/A 09/30/2014   Procedure: Left Heart Cath and Coronary Angiography;  Surgeon: Sherren Mocha, MD;  Location: [facility];  Service: Cardiovascular;  Laterality: N/A;  . LAPAROSCOPIC APPENDECTOMY N/A 06/03/2012   Procedure: APPENDECTOMY LAPAROSCOPIC;  Surgeon: Stark Klein, MD;  Location: [facility];  Service: General;  Laterality: N/A;  . Left knee surgery  2008  . LEG SURGERY    . TONSILLECTOMY    . TOTAL KNEE ARTHROPLASTY Left 08/22/2016   Procedure: TOTAL KNEE ARTHROPLASTY;  Surgeon: Meredith Pel, MD;  Location: [facility];  Service: Orthopedics;  Laterality: Left;  . TUBAL LIGATION  1989    OB History    No data available       Home Medications    Prior to Admission medications   Medication Sig Start Date End Date Taking? Authorizing Provider  albuterol (PROVENTIL HFA;VENTOLIN HFA) 108 (90 Base) MCG/ACT inhaler Inhale 2 puffs into the lungs every 4 (four) hours as needed for wheezing or shortness of breath.   Yes [provider]  amLODipine (NORVASC) 5 MG tablet Take 5 mg by mouth daily. 06/08/17  Yes [provider]  aspirin EC 81 MG tablet Take 81 mg by mouth daily.   Yes [provider]  atorvastatin (LIPITOR) 40 MG tablet Take 40 mg by mouth at bedtime.    Yes [provider]  budesonide-formoterol (SYMBICORT) 160-4.5 MCG/ACT inhaler Inhale 2 puffs into the lungs 2 (two) times daily. 05/28/16  Yes  Eugenie Filler, MD  carvedilol (COREG CR) 10 MG 24 hr capsule Take 10 mg by mouth daily.   Yes [provider]  cyclobenzaprine (FLEXERIL) 5 MG tablet Take 5 mg by mouth 2 (two) times daily.  12/16/15  Yes [provider]  furosemide (LASIX) 40 MG tablet Take 40 mg by mouth daily.   Yes Rai, Ripudeep K, MD  gabapentin (NEURONTIN) 300 MG capsule Take 2 capsules (600 mg total) by mouth 3 (three) times daily. 05/27/14  Yes Reyne Dumas, MD  glipiZIDE (GLUCOTROL) 5 MG tablet Take 5 mg by mouth 2 (two) times daily. 06/04/17  Yes [provider]  HYDROcodone-acetaminophen (NORCO/VICODIN) 5-325 MG tablet Take 1 tablet by mouth every 8 (eight) hours as needed for moderate pain. 10/19/16  Yes Meredith Pel, MD  ipratropium-albuterol (  DUONEB) 0.5-2.5 (3) MG/3ML SOLN Take 3 mLs by nebulization 3 (three) times daily. Use 3 times daily for 4 days, then 4-6 hours as needed. Stop date 09/04/16   Yes [provider]  isosorbide dinitrate (ISORDIL) 20 MG tablet Take 20 mg by mouth 2 (two) times daily.    Yes [provider]  KLOR-CON M10 10 MEQ tablet Take 10 mEq by mouth 2 (two) times daily.  09/23/13  Yes [provider]  KOMBIGLYZE XR 5-500 MG TB24 Take 1 tablet by mouth every morning.  10/11/15  Yes [provider]  lisinopril (PRINIVIL,ZESTRIL) 20 MG tablet Take 20 mg by mouth daily. Take 2 tabs to = 20 mg qd 09/21/14  Yes [provider]  loratadine (CLARITIN) 10 MG tablet Take 1 tablet (10 mg total) by mouth daily. Patient taking differently: Take 10 mg by mouth daily as needed.  05/28/16  Yes Eugenie Filler, MD  meloxicam (MOBIC) 7.5 MG tablet Take 7.5 mg by mouth daily. 05/17/17  Yes [provider]  Multiple Vitamin (MULTIVITAMIN) capsule Take 1 capsule by mouth daily.   Yes [provider]  MYRBETRIQ 25 MG TB24 tablet Take 25 mg by mouth daily. 05/23/17  Yes [provider]  nitroGLYCERIN (NITROSTAT) 0.4 MG  SL tablet Place 0.4 mg under the tongue every 5 (five) minutes as needed. For chest pain.   Yes [provider]  omeprazole (PRILOSEC) 20 MG capsule Take 20 mg by mouth 2 (two) times daily. 10/05/16  Yes [provider]  oxyCODONE (OXY IR/ROXICODONE) 5 MG immediate release tablet Take 1-2 tablets (5-10 mg total) by mouth every 3 (three) hours as needed for breakthrough pain. 08/25/16  Yes Meredith Pel, MD  pantoprazole (PROTONIX) 40 MG tablet Take 1 tablet (40 mg total) by mouth daily. 05/27/14  Yes Reyne Dumas, MD  sertraline (ZOLOFT) 100 MG tablet Take 100 mg by mouth daily.    Yes [provider]  HYDROcodone-acetaminophen (NORCO/VICODIN) 5-325 MG tablet Take 1 tablet by mouth every 12 (twelve) hours as needed for moderate pain. Patient not taking: Reported on 06/11/2017 11/17/16   Meredith Pel, MD    Family History Family History  Problem Relation Age of Onset  . Emphysema Mother   . Arthritis Mother   . Heart failure Mother        alive @ 32  . Stroke Mother   . Asthma Brother   . Heart disease Father        died @ 29's.  . Stroke Father   . Diabetes Sister   . Heart disease Paternal Grandfather   . Colon cancer Maternal Grandfather   . Breast cancer Maternal Aunt     Social History Social History   Tobacco Use  . Smoking status: Former Smoker    Packs/day: 0.50    Years: 15.00    Pack years: 7.50    Types: Cigarettes    Last attempt to quit: 04/24/1985    Years since quitting: 32.1  . Smokeless tobacco: Never Used  Substance Use Topics  . Alcohol use: No    Alcohol/week: 0.0 oz  . Drug use: No     Allergies   Penicillins; Latex; and Sulfa antibiotics   Review of Systems Review of Systems  Constitutional: Positive for fatigue. Negative for chills and fever.  Eyes: Positive for visual disturbance. Negative for photophobia.  Cardiovascular: Positive for chest pain. Negative for palpitations and leg swelling.    Gastrointestinal: Positive for nausea.  Negative for abdominal distention and vomiting.  Neurological: Positive for weakness, light-headedness, numbness and headaches.  All other systems reviewed and are negative.    Physical Exam Updated Vital Signs BP (!) 154/81   Pulse 82   Temp 98.2 F (36.8 C) (Oral)   Resp 18   Ht 4\' 11"  (1.499 m)   Wt 102.5 kg (226 lb)   SpO2 95%   BMI 45.65 kg/m   Physical Exam  Constitutional: She is oriented to person, place, and time. She appears well-developed and well-nourished. No distress.  HENT:  Head: Normocephalic and atraumatic.  Eyes: Conjunctivae and EOM are normal. Pupils are equal, round, and reactive to light. Right eye exhibits no discharge. Left eye exhibits no discharge.  Neck: Normal range of motion. Neck supple. No JVD present. No tracheal deviation present.  No midline spine TTP, no paraspinal muscle tenderness, no deformity, crepitus, or step-off noted   Cardiovascular: Normal rate, regular rhythm and intact distal pulses.  2+ radial and DP/PT pulses bl, negative Homan's bl, no LE edema  Pulmonary/Chest: Effort normal.  Mild crackles of the left lung base and mildly diminished breath sounds of the right lung base.  Equal rise and fall of chest, no increased work of breathing.  She has bilateral anterior parasternal chest wall tenderness to palpation with no deformity, crepitus, or paradoxical wall motion.  She has no midline thoracic spine tenderness but she does have bilateral posterior chest wall pain on palpation  Abdominal: She exhibits no distension.  Musculoskeletal: She exhibits no edema.  No midline spine TTP, no paraspinal muscle tenderness, no deformity, crepitus, or step-off noted. See neuro section for further comments on extremity strength  Neurological: She is alert and oriented to person, place, and time. Gait normal. GCS eye subscore is 4. GCS verbal subscore is 5. GCS motor subscore is 6.  Mental Status:  Alert,  thought content appropriate, able to give a coherent history. Speech fluent without evidence of aphasia. Able to follow 2 step commands without difficulty.  Cranial Nerves:  II:  pupils equal, round, reactive to light III,IV, VI: ptosis not present, extra-ocular motions intact bilaterally  V,VII: smile symmetric, facial light touch sensation equal VIII: hearing grossly normal to voice  X: uvula elevates symmetrically  XI: bilateral shoulder shrug symmetric and strong XII: midline tongue extension without fassiculations Motor:  Normal tone.  4+/5 strength of left grip and left hip flexor. Otherwise,  5/5 strength of BUE and BLE major muscle groups including strong and equal and dorsiflexion/plantar flexion Sensory: Decreased sensation to light touch of the left side of the face and left lower extremity circumferentially, mildly decreased sensation of the left upper extremity. Cerebellar: normal finger-to-nose with bilateral upper extremities Gait: somewhat unsteady gait and balance. Able to walk on toes and heels.  CV: 2+ radial and DP/PT pulses  Left pronator drift present   Skin: Skin is warm and dry. No erythema.  Psychiatric: She has a normal mood and affect. Her behavior is normal.  Nursing note and vitals reviewed.    ED Treatments / Results  Labs (all labs ordered are listed, but only abnormal results are displayed) Labs Reviewed  BASIC METABOLIC PANEL  CBC  I-STAT TROPONIN, ED  I-STAT BETA HCG BLOOD, ED ([facility], [facility], [facility] ONLY)  I-STAT TROPONIN, ED    EKG  EKG Interpretation  Date/Time:  Sunday June 10 2017 23:21:59 EST Ventricular Rate:  79 PR Interval:  174 QRS Duration: 106 QT Interval:  404 QTC Calculation: 463  R Axis:   57 Text Interpretation:  Normal sinus rhythm Incomplete left bundle branch block ST & T wave abnormality, consider inferior ischemia Prolonged QT Abnormal ECG No significant change since last tracing Confirmed by Orpah Greek [phone]) on  06/10/2017 11:27:33 PM       Radiology Dg Chest 2 View  Result Date: 06/10/2017 CLINICAL DATA:  Chest pain EXAM: CHEST  2 VIEW COMPARISON:  03/27/2017, 05/21/2016, 12/14/2015 FINDINGS: No focal consolidation or pleural effusion. Stable cardiomediastinal silhouette. Vascular congestion. Mild bronchitic changes. No pneumothorax. IMPRESSION: 1. Stable cardiomediastinal silhouette with mild vascular congestion 2. Similar appearance of bronchitic changes Electronically Signed   By: Donavan Foil M.D.   On: 06/10/2017 23:47   Ct Head Wo Contrast  Result Date: 06/10/2017 CLINICAL DATA:  Intermittent headache for 1 week. EXAM: CT HEAD WITHOUT CONTRAST TECHNIQUE: Contiguous axial images were obtained from the base of the skull through the vertex without intravenous contrast. COMPARISON:  Head CT and brain MRI November 2016 FINDINGS: Brain: 10 mm cystic right sellar mass is unchanged, better appreciated on prior MRI. No intracranial hemorrhage, mass effect, or midline shift. No hydrocephalus. The basilar cisterns are patent. No evidence of territorial infarct or acute ischemia. No extra-axial or intracranial fluid collection. Vascular: No hyperdense vessel or unexpected calcification. Skull: No fracture or focal lesion. Sinuses/Orbits: Paranasal sinuses and mastoid air cells are clear. The visualized orbits are unremarkable. Other: None. IMPRESSION: 1.  No acute intracranial abnormality. 2. 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 Electronically Signed   By: Jeb Levering M.D.   On: 06/10/2017 23:54   Mr Brain Wo Contrast (neuro Protocol)  Result Date: 06/11/2017 CLINICAL DATA:  Stroke. EXAM: MRI HEAD WITHOUT CONTRAST TECHNIQUE: Multiplanar, multiecho pulse sequences of the brain and surrounding structures were obtained without intravenous contrast. COMPARISON:  Head CT from yesterday FINDINGS: Brain: No acute infarction, hemorrhage, hydrocephalus, extra-axial collection or mass  effect. 13 mm cystic intensity structure in the right lateral sella, with mild T2 hypointense complexity along the midline wall. The structure is also slightly hyperintense to CSF on FLAIR. 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 Rare FLAIR hyperintensities in the cerebral white matter that are stable. Vascular: Major flow voids are preserved. Skull and upper cervical spine: No evidence of marrow lesion Sinuses/Orbits: Negative IMPRESSION: 1. No acute finding including infarct. 2. 13 mm sellar cyst that is stable from 2016. Electronically Signed   By: Monte Fantasia M.D.   On: 06/11/2017 11:03    Procedures Procedures (including critical care time)  Medications Ordered in ED Medications  carvedilol (COREG CR) 24 hr capsule 10 mg (not administered)  LORazepam (ATIVAN) injection 0.5 mg (0.5 mg Intramuscular Given 06/11/17 0953)  sodium chloride 0.9 % bolus 1,000 mL (1,000 mLs Intravenous New Bag/Given 06/11/17 1228)  ketorolac (TORADOL) 30 MG/ML injection 30 mg (30 mg Intravenous Given 06/11/17 1228)  amLODipine (NORVASC) tablet 5 mg (5 mg Oral Given 06/11/17 1228)     Initial Impression / Assessment and Plan / ED Course  I have reviewed the triage vital signs and the nursing notes.  Pertinent labs & imaging results that were available during my care of the patient were reviewed by me and considered in my medical decision making (see chart for details).   Patient presents with 1 week of intermittent headaches and elevated blood pressure.  Initially hypertensive at 170/89 with improvement while in the ED.  CT shows no acute changes and stable Rathke's cleft cyst.  No meningeal signs or fever to suggest meningitis.  No red flag signs concerning for SAH.  Stable for discharge home with follow-up with primary care physician for reevaluation of hypertension and neurologist for reevaluation of headaches.  With left-sided subjective numbness and mild weakness with grip strength will obtain  MRI for further evaluation.  1:17 PM MRI shows no acute changes.  Serial troponins are negative, EKG shows no acute changes from last tracing.  I doubt ACS or MI given pain is entirely reproducible on palpation.  Chest x-ray shows a stable cardiomediastinal silhouette with mild pulmonary vascular congestion.  No evidence of consolidation or pleural effusion.  Doubt PE.  On reevaluation, patient is resting comfortably and states that her headache is improved after administration of fluids and IV Toradol.  She is ambulatory without difficulty.  She feels comfortable going home.  No further emergent workup required at this time.  No evidence of endorgan damage or hypertensive urgency/emergency.  She will follow-up with her primary care physician for reevaluation of her hypertension and her neurologist for reevaluation of her headaches.  Recommend Tylenol and ibuprofen for headache management in the meantime.  Recommend compliance with her blood pressure medicines.  Discussed indications for return to the ED.  Patient and patient's family verbalized understanding of and agreement with plan and patient stable for discharge home at this time.  Final Clinical Impressions(s) / ED Diagnoses   Final diagnoses:  Intermittent headache  Essential hypertension    ED Discharge Orders    None       Debroah Baller 06/11/17 1327    Margette Fast, MD 06/11/17 [phone]

## 2017-06-11 NOTE — ED Notes (Signed)
This RN went over to MRI to administer Ativan.

## 2017-06-11 NOTE — Discharge Instructions (Signed)
Alternate 600 mg of ibuprofen and [phone] mg of Tylenol every 3 hours as needed for pain. Do not exceed 4000 mg of Tylenol daily.  Drink plenty of fluids and get plenty of rest.  Continue taking your home medications.  Follow-up with your primary care physician for reevaluation of your high blood pressure and follow-up with your neurologist for reevaluation of your headaches.  Return to the emergency department if any concerning signs or symptoms develop such as severe worsening of headache, fevers, neck stiffness, passing out, or altered mental status.

## 2017-07-06 ENCOUNTER — Other Ambulatory Visit: Payer: Self-pay | Admitting: Internal Medicine

## 2017-07-06 DIAGNOSIS — Z1231 Encounter for screening mammogram for malignant neoplasm of breast: Secondary | ICD-10-CM

## 2017-07-25 ENCOUNTER — Ambulatory Visit
Admission: RE | Admit: 2017-07-25 | Discharge: 2017-07-25 | Disposition: A | Discharge status: Home / Self Care | Payer: Medicare Other | Source: Ambulatory Visit | Attending: Internal Medicine | Admitting: Internal Medicine

## 2017-07-25 DIAGNOSIS — Z1231 Encounter for screening mammogram for malignant neoplasm of breast: Secondary | ICD-10-CM

## 2017-07-27 ENCOUNTER — Other Ambulatory Visit: Payer: Self-pay

## 2017-07-27 ENCOUNTER — Encounter: Payer: Self-pay | Admitting: Emergency Medicine

## 2017-07-27 ENCOUNTER — Emergency Department
Admission: EM | Admit: 2017-07-27 | Discharge: 2017-07-28 | Disposition: A | Discharge status: Home / Self Care | Payer: Medicare Other | Attending: Emergency Medicine | Admitting: Emergency Medicine

## 2017-07-27 DIAGNOSIS — Z79899 Other long term (current) drug therapy: Secondary | ICD-10-CM | POA: Insufficient documentation

## 2017-07-27 DIAGNOSIS — J45909 Unspecified asthma, uncomplicated: Secondary | ICD-10-CM | POA: Insufficient documentation

## 2017-07-27 DIAGNOSIS — E119 Type 2 diabetes mellitus without complications: Secondary | ICD-10-CM | POA: Diagnosis not present

## 2017-07-27 DIAGNOSIS — Z96652 Presence of left artificial knee joint: Secondary | ICD-10-CM | POA: Diagnosis not present

## 2017-07-27 DIAGNOSIS — Z7982 Long term (current) use of aspirin: Secondary | ICD-10-CM | POA: Insufficient documentation

## 2017-07-27 DIAGNOSIS — I5030 Unspecified diastolic (congestive) heart failure: Secondary | ICD-10-CM | POA: Diagnosis not present

## 2017-07-27 DIAGNOSIS — Z91040 Latex allergy status: Secondary | ICD-10-CM | POA: Insufficient documentation

## 2017-07-27 DIAGNOSIS — A0811 Acute gastroenteropathy due to Norwalk agent: Secondary | ICD-10-CM | POA: Diagnosis not present

## 2017-07-27 DIAGNOSIS — Z87891 Personal history of nicotine dependence: Secondary | ICD-10-CM | POA: Diagnosis not present

## 2017-07-27 DIAGNOSIS — R1084 Generalized abdominal pain: Secondary | ICD-10-CM | POA: Diagnosis present

## 2017-07-27 DIAGNOSIS — I110 Hypertensive heart disease with heart failure: Secondary | ICD-10-CM | POA: Diagnosis not present

## 2017-07-27 LAB — COMPREHENSIVE METABOLIC PANEL
ALT: 57 U/L — ABNORMAL HIGH (ref 14–54)
AST: 36 U/L (ref 15–41)
Albumin: 4.1 g/dL (ref 3.5–5.0)
Alkaline Phosphatase: 66 U/L (ref 38–126)
Anion gap: 7 (ref 5–15)
BUN: 14 mg/dL (ref 6–20)
CO2: 28 mmol/L (ref 22–32)
Calcium: 9 mg/dL (ref 8.9–10.3)
Chloride: 109 mmol/L (ref 101–111)
Creatinine, Ser: 0.9 mg/dL (ref 0.44–1.00)
GFR calc Af Amer: >60 mL/min (ref >60)
GFR calc non Af Amer: >60 mL/min (ref >60)
Glucose, Bld: 152 mg/dL — ABNORMAL HIGH (ref 65–99)
Potassium: 3.6 mmol/L (ref 3.5–5.1)
Sodium: 144 mmol/L (ref 135–145)
Total Bilirubin: 0.6 mg/dL (ref 0.3–1.2)
Total Protein: 7.5 g/dL (ref 6.5–8.1)

## 2017-07-27 LAB — URINALYSIS, COMPLETE (UACMP) WITH MICROSCOPIC
Bacteria, UA: NONE SEEN
Bilirubin Urine: NEGATIVE
Glucose, UA: NEGATIVE mg/dL
Hgb urine dipstick: NEGATIVE
Ketones, ur: 5 mg/dL — AB
Leukocytes, UA: NEGATIVE
Nitrite: NEGATIVE
Protein, ur: NEGATIVE mg/dL
Specific Gravity, Urine: 1.027 (ref 1.005–1.030)
pH: 5 (ref 5.0–8.0)

## 2017-07-27 LAB — CBC
HCT: 40.6 % (ref 35.0–47.0)
Hemoglobin: 13.7 g/dL (ref 12.0–16.0)
MCH: 28.1 pg (ref 26.0–34.0)
MCHC: 33.6 g/dL (ref 32.0–36.0)
MCV: 83.4 fL (ref 80.0–100.0)
Platelets: 200 10*3/uL (ref 150–440)
RBC: 4.87 MIL/uL (ref 3.80–5.20)
RDW: 15.2 % — ABNORMAL HIGH (ref 11.5–14.5)
WBC: 9.5 10*3/uL (ref 3.6–11.0)

## 2017-07-27 LAB — LIPASE, BLOOD: Lipase: 43 U/L (ref 11–51)

## 2017-07-27 MED ORDER — SODIUM CHLORIDE 0.9 % IV BOLUS
1000.0000 mL | Freq: Once | INTRAVENOUS | Status: AC
  Administered 2017-07-28: 1000 mL via INTRAVENOUS

## 2017-07-27 NOTE — ED Triage Notes (Signed)
Pt to ED via EMS c/o abd pain, states ate around 2030 tonight and had onset of generalized abd pain, nausea x6, diarrhea x4.  States feels like bloating and pressure.  Hx of DBM, HTN, asthma, VSS.

## 2017-07-28 DIAGNOSIS — A0811 Acute gastroenteropathy due to Norwalk agent: Secondary | ICD-10-CM | POA: Diagnosis not present

## 2017-07-28 LAB — GASTROINTESTINAL PANEL BY PCR, STOOL (REPLACES STOOL CULTURE)

## 2017-07-28 LAB — C DIFFICILE QUICK SCREEN W PCR REFLEX
C Diff antigen: NEGATIVE
C Diff interpretation: NOT DETECTED
C Diff toxin: NEGATIVE

## 2017-07-28 MED ORDER — KETOROLAC TROMETHAMINE 30 MG/ML IJ SOLN: 30.0000 mg | Freq: Once | INTRAMUSCULAR | Status: DC

## 2017-07-28 MED ORDER — ONDANSETRON HCL 4 MG/2ML IJ SOLN
4.0000 mg | Freq: Once | INTRAMUSCULAR | Status: AC
  Administered 2017-07-28: 4 mg via INTRAVENOUS
  Filled 2017-07-28: qty 2

## 2017-07-28 MED ORDER — ONDANSETRON 4 MG PO TBDP: 4.0000 mg | ORAL_TABLET | Freq: Three times a day (TID) | ORAL | 0 refills | Status: DC | PRN

## 2017-07-28 MED ORDER — LOPERAMIDE HCL 2 MG PO CAPS
ORAL_CAPSULE | ORAL | Status: AC
  Administered 2017-07-28: 4 mg via ORAL
  Filled 2017-07-28: qty 2

## 2017-07-28 MED ORDER — LOPERAMIDE HCL 2 MG PO CAPS
4.0000 mg | ORAL_CAPSULE | Freq: Once | ORAL | Status: AC
  Administered 2017-07-28: 4 mg via ORAL

## 2017-07-28 MED ORDER — MORPHINE SULFATE (PF) 2 MG/ML IV SOLN
INTRAVENOUS | Status: AC
  Administered 2017-07-28: 2 mg via INTRAVENOUS
  Filled 2017-07-28: qty 1

## 2017-07-28 MED ORDER — MORPHINE SULFATE (PF) 2 MG/ML IV SOLN
2.0000 mg | Freq: Once | INTRAVENOUS | Status: AC
  Administered 2017-07-28: 2 mg via INTRAVENOUS

## 2017-07-28 MED ORDER — SODIUM CHLORIDE 0.9 % IV BOLUS: 1000.0000 mL | Freq: Once | INTRAVENOUS | Status: DC

## 2017-07-28 NOTE — ED Provider Notes (Signed)
[facility] Emergency Department Provider Note   First MD Initiated Contact with Patient 07/27/17 2344     (approximate)  I have reviewed the triage vital signs and the nursing notes.   HISTORY  Chief Complaint Abdominal Pain    HPI [name] is a 57 y.o. female with below list of chronic medical conditions presents to the emergency department with acute onset of generalized abdominal cramping vomiting and diarrhea which began after eating at 8:30 PM tonight.  Patient denies any fever.  Patient states abdominal pain is mild cramping in nature.  Patient denies any alleviating or aggravating factors.  Patient denies any blood noted in the emesis or the diarrhea.  Past Medical History:  Diagnosis Date  . Anginal pain ([place])    a. NL cath in 2008;  b. Myoview 03/2011: dec uptake along mid anterior wall on stress imaging -> ? attenuation vs. ischemia, EF 65%;  c. Echo 04/2011: EF 55-60%, no RWMA, Gr 2 dd  . Anxiety   . Arthritis   . Asthma   . Bone cancer ([place])   . Cancer ([place])   . CHF (congestive heart failure) ([place])   . CHF (congestive heart failure) ([place])   . Depression   . Diabetes mellitus   . Dyspnea    with exertion  . Frequent urination   . GERD (gastroesophageal reflux disease)   . Headache   . Hypertension   . 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 Obesity   . Pulmonary edema   . Pulmonary embolism ([place])    a. 2008 -> coumadin x 6 mos.  . Sleep apnea    does not use CPAP  . Urinary urgency     Patient Active Problem List   Diagnosis Date Noted  . 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 Presence of left artificial knee joint 09/20/2016  . Arthritis of knee 08/22/2016  . Cervical radiculopathy 07/26/2016  . Cervical disc disorder with radiculopathy 07/26/2016  . Right arm pain 06/29/2016  . Cervicalgia 06/29/2016  . Primary osteoarthritis of left knee 06/29/2016  . Hypersomnia with sleep apnea 11/18/2015    . Lethargy 11/18/2015  . Chest pain 09/30/2014  . Abnormal cardiac function test 09/29/2014  . Chest pain with moderate risk for cardiac etiology 09/28/2014  . HTN (hypertension) 09/28/2014  . Hypokalemia   . Paresthesia 06/18/2014  . Numbness and tingling of left arm and leg   . Unstable angina ([place]) 05/24/2014  . OSA on CPAP 05/24/2014  . Diabetes mellitus type 2 in obese ([place]) 10/29/2012  . S/P laparoscopic appendectomy 06/04/2012  . Diastolic CHF ([place]) 41/32/4401  . Mediastinal mass 01/09/2012  . SOB (shortness of breath) 06/20/2011  . Mediastinal abnormality 06/20/2011  . Dyslipidemia 12/23/2009  . GLAUCOMA 12/23/2009  . ARTHRITIS 12/23/2009  . 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 Morbid obesity-BMI 45 09/13/2006  . ANXIETY STATE NOS 09/13/2006  . DISORDER, DEPRESSIVE NEC 09/13/2006  . CARPAL TUNNEL SYNDROME, MILD 09/13/2006  . Unspecified essential hypertension 09/13/2006  . IBS 09/13/2006  . DEGENERATION, LUMBAR/LUMBOSACRAL DISC 09/13/2006  . SYMPTOM, SWELLING/MASS/LUMP IN CHEST 09/13/2006  . PULMONARY EMBOLISM, HX OF 09/13/2006    Past Surgical History:  Procedure Laterality Date  . ABDOMINAL HYSTERECTOMY  2005  . CARDIAC CATHETERIZATION     Normal  . CARDIAC CATHETERIZATION N/A 09/30/2014   Procedure: Left Heart Cath and Coronary Angiography;  Surgeon: Sherren Mocha, MD;  Location: [facility];  Service: Cardiovascular;  Laterality: N/A;  . LAPAROSCOPIC APPENDECTOMY N/A 06/03/2012   Procedure: APPENDECTOMY LAPAROSCOPIC;  Surgeon: Stark Klein, MD;  Location: [place] [place];  Service: General;  Laterality: N/A;  . Left knee surgery  2008  . LEG SURGERY    . TONSILLECTOMY    . TOTAL KNEE ARTHROPLASTY Left 08/22/2016   Procedure: TOTAL KNEE ARTHROPLASTY;  Surgeon: Meredith Pel, MD;  Location: [place];  Service: Orthopedics;  Laterality: Left;  . TUBAL LIGATION  1989    Prior to Admission medications   Medication Sig Start Date End Date Taking? Authorizing Provider   albuterol (PROVENTIL HFA;VENTOLIN HFA) 108 (90 Base) MCG/ACT inhaler Inhale 2 puffs into the lungs every 4 (four) hours as needed for wheezing or shortness of breath.    [provider]  amLODipine (NORVASC) 5 MG tablet Take 5 mg by mouth daily. 06/08/17   [provider]  aspirin EC 81 MG tablet Take 81 mg by mouth daily.    [provider]  atorvastatin (LIPITOR) 40 MG tablet Take 40 mg by mouth at bedtime.     [provider]  budesonide-formoterol (SYMBICORT) 160-4.5 MCG/ACT inhaler Inhale 2 puffs into the lungs 2 (two) times daily. 05/28/16   Eugenie Filler, MD  carvedilol (COREG CR) 10 MG 24 hr capsule Take 10 mg by mouth daily.    [provider]  cyclobenzaprine (FLEXERIL) 5 MG tablet Take 5 mg by mouth 2 (two) times daily.  12/16/15   [provider]  furosemide (LASIX) 40 MG tablet Take 40 mg by mouth daily.    Rai, Ripudeep K, MD  gabapentin (NEURONTIN) 300 MG capsule Take 2 capsules (600 mg total) by mouth 3 (three) times daily. 05/27/14   Reyne Dumas, MD  glipiZIDE (GLUCOTROL) 5 MG tablet Take 5 mg by mouth 2 (two) times daily. 06/04/17   [provider]  HYDROcodone-acetaminophen (NORCO/VICODIN) 5-325 MG tablet Take 1 tablet by mouth every 8 (eight) hours as needed for moderate pain. 10/19/16   Meredith Pel, MD  HYDROcodone-acetaminophen (NORCO/VICODIN) 5-325 MG tablet Take 1 tablet by mouth every 12 (twelve) hours as needed for moderate pain. Patient not taking: Reported on 06/11/2017 11/17/16   Meredith Pel, MD  ipratropium-albuterol (DUONEB) 0.5-2.5 (3) MG/3ML SOLN Take 3 mLs by nebulization 3 (three) times daily. Use 3 times daily for 4 days, then 4-6 hours as needed. Stop date 09/04/16    [provider]  isosorbide dinitrate (ISORDIL) 20 MG tablet Take 20 mg by mouth 2 (two) times daily.     [provider]  KLOR-CON M10 10 MEQ tablet Take 10 mEq by mouth 2 (two) times daily.  09/23/13    [provider]  KOMBIGLYZE XR 5-500 MG TB24 Take 1 tablet by mouth every morning.  10/11/15   [provider]  lisinopril (PRINIVIL,ZESTRIL) 20 MG tablet Take 20 mg by mouth daily. Take 2 tabs to = 20 mg qd 09/21/14   [provider]  loratadine (CLARITIN) 10 MG tablet Take 1 tablet (10 mg total) by mouth daily. Patient taking differently: Take 10 mg by mouth daily as needed.  05/28/16   Eugenie Filler, MD  meloxicam (MOBIC) 7.5 MG tablet Take 7.5 mg by mouth daily. 05/17/17   [provider]  Multiple Vitamin (MULTIVITAMIN) capsule Take 1 capsule by mouth daily.    [provider]  MYRBETRIQ 25 MG TB24 tablet Take 25 mg by mouth daily. 05/23/17   [provider]  nitroGLYCERIN (NITROSTAT) 0.4 MG SL tablet Place 0.4 mg under the tongue every 5 (five) minutes as needed. For chest pain.    [provider]  omeprazole (PRILOSEC) 20 MG capsule Take 20 mg by mouth 2 (two) times daily. 10/05/16   [provider]  oxyCODONE (OXY IR/ROXICODONE) 5 MG immediate release tablet Take 1-2 tablets (5-10 mg total) by mouth every 3 (three) hours as needed for breakthrough pain. 08/25/16   Meredith Pel, MD  pantoprazole (PROTONIX) 40 MG tablet Take 1 tablet (40 mg total) by mouth daily. 05/27/14   Reyne Dumas, MD  sertraline (ZOLOFT) 100 MG tablet Take 100 mg by mouth daily.     [provider]    Allergies Penicillins; Latex; and Sulfa antibiotics  Family History  Problem Relation Age of Onset  . Emphysema Mother   . Arthritis Mother   . Heart failure Mother        alive @ 67  . Stroke Mother   . Asthma Brother   . Heart disease Father        died @ 31's.  . Stroke Father   . Diabetes Sister   . Heart disease Paternal Grandfather   . Colon cancer Maternal Grandfather   . Breast cancer Maternal Aunt     Social History Social History   Tobacco Use  . Smoking status: Former Smoker    Packs/day: 0.50    Years:  15.00    Pack years: 7.50    Types: Cigarettes    Last attempt to quit: 04/24/1985    Years since quitting: 32.2  . Smokeless tobacco: Never Used  Substance Use Topics  . Alcohol use: No    Alcohol/week: 0.0 oz  . Drug use: No    Review of Systems Constitutional: No fever/chills Eyes: No visual changes. ENT: No sore throat. Cardiovascular: Denies chest pain. Respiratory: Denies shortness of breath. Gastrointestinal: Positive for abdominal pain vomiting and diarrhea  genitourinary: Negative for dysuria. Musculoskeletal: Negative for neck pain.  Negative for back pain. Integumentary: Negative for rash. Neurological: Negative for headaches, focal weakness or numbness.   ____________________________________________   PHYSICAL EXAM:  VITAL SIGNS: ED Triage Vitals  Enc Vitals Group     BP --      Pulse Rate 07/27/17 2243 95     Resp 07/27/17 2243 (!) 25     Temp 07/27/17 2243 98.3 F (36.8 C)     Temp Source 07/27/17 2243 Oral     SpO2 07/27/17 2243 94 %     Weight 07/27/17 2244 102.5 kg (226 lb)     Height 07/27/17 2244 1.499 m (4\' 11" )     Head Circumference --      Peak Flow --      Pain Score 07/27/17 2244 10     Pain Loc --      Pain Edu? --      Excl. in [place]? --     Constitutional: Alert and oriented. Well appearing and in no acute distress. Eyes: Conjunctivae are normal.  Head: Atraumatic. Mouth/Throat: Mucous membranes are moist.  Oropharynx non-erythematous. Neck: No stridor.   Cardiovascular: Normal rate, regular rhythm. Good peripheral circulation. Grossly normal heart sounds. Respiratory: Normal respiratory effort.  No retractions. Lungs CTAB. Gastrointestinal: Soft and nontender. No distention.  Musculoskeletal: No lower extremity tenderness nor edema. No gross deformities of extremities. Neurologic:  Normal speech and language. No gross focal neurologic deficits are appreciated.  Skin:  Skin is warm, dry  and intact. No rash noted. Psychiatric: Mood  and affect are normal. Speech and behavior are normal.  ____________________________________________   LABS (all labs ordered are listed, but only abnormal results are displayed)  Labs Reviewed  COMPREHENSIVE METABOLIC PANEL - Abnormal; Notable for the following components:      Result Value   Glucose, Bld 152 (*)    ALT 57 (*)    All other components within normal limits  CBC - Abnormal; Notable for the following components:   RDW 15.2 (*)    All other components within normal limits  URINALYSIS, COMPLETE (UACMP) WITH MICROSCOPIC - Abnormal; Notable for the following components:   Color, Urine YELLOW (*)    APPearance HAZY (*)    Ketones, ur 5 (*)    Squamous Epithelial / LPF 0-5 (*)    All other components within normal limits  C DIFFICILE QUICK SCREEN W PCR REFLEX  GASTROINTESTINAL PANEL BY PCR, STOOL (REPLACES STOOL CULTURE)  LIPASE, BLOOD  POC URINE PREG, ED   ____________________________________________  EKG  ED ECG REPORT I, Teachey N BROWN, the attending physician, personally viewed and interpreted this ECG.   Date: 07/28/2017  EKG Time: 10:43 PM  Rate: 95  Rhythm: Normal sinus rhythm  Axis: Normal  Intervals: Normal  ST&T Change: None  ____________________   Procedures   ____________________________________________   INITIAL IMPRESSION / ASSESSMENT AND PLAN / ED COURSE  As part of my medical decision making, I reviewed the following data within the electronic MEDICAL RECORD NUMBER   57 year old female presenting with multiple episode of nonbloody emesis and diarrhea with associated abdominal cramping.  Concern for possible infectious etiology of vomiting and diarrhea given no abdominal pain on exam.  Patient given IV Zofran with resolution of vomiting.  Patient given Imodium with resolution of diarrhea.  Patient also given IV normal saline 1 L.  Stool cultures obtained which is positive for [place]inda [place] virus.  Patient has no further vomiting at this time  diarrhea controlled following Imodium ____________________________________________  FINAL CLINICAL IMPRESSION(S) / ED DIAGNOSES  Final diagnoses:  Norovirus     MEDICATIONS GIVEN DURING THIS VISIT:  Medications  sodium chloride 0.9 % bolus 1,000 mL (has no administration in time range)  sodium chloride 0.9 % bolus 1,000 mL (1,000 mLs Intravenous New Bag/Given 07/28/17 0015)  ondansetron (ZOFRAN) injection 4 mg (4 mg Intravenous Given 07/28/17 0016)  morphine 2 MG/ML injection 2 mg (2 mg Intravenous Given 07/28/17 0016)  loperamide (IMODIUM) capsule 4 mg (4 mg Oral Given 07/28/17 0122)     ED Discharge Orders    None       Note:  This document was prepared using Dragon voice recognition software and may include unintentional dictation errors.    Gregor Hams, MD 07/28/17 [phone]

## 2017-08-14 ENCOUNTER — Emergency Department (HOSPITAL_COMMUNITY)
Admission: EM | Admit: 2017-08-14 | Discharge: 2017-08-15 | Disposition: A | Discharge status: Home / Self Care | Payer: Medicare Other | Attending: Emergency Medicine | Admitting: Emergency Medicine

## 2017-08-14 ENCOUNTER — Emergency Department (HOSPITAL_BASED_OUTPATIENT_CLINIC_OR_DEPARTMENT_OTHER): Admit: 2017-08-14 | Discharge: 2017-08-14 | Disposition: A | Discharge status: Home / Self Care | Payer: Medicare Other

## 2017-08-14 ENCOUNTER — Encounter (HOSPITAL_COMMUNITY): Payer: Self-pay

## 2017-08-14 DIAGNOSIS — J45909 Unspecified asthma, uncomplicated: Secondary | ICD-10-CM | POA: Diagnosis not present

## 2017-08-14 DIAGNOSIS — Z79899 Other long term (current) drug therapy: Secondary | ICD-10-CM | POA: Diagnosis not present

## 2017-08-14 DIAGNOSIS — M79609 Pain in unspecified limb: Secondary | ICD-10-CM | POA: Diagnosis not present

## 2017-08-14 DIAGNOSIS — M5432 Sciatica, left side: Secondary | ICD-10-CM | POA: Insufficient documentation

## 2017-08-14 DIAGNOSIS — I509 Heart failure, unspecified: Secondary | ICD-10-CM | POA: Diagnosis not present

## 2017-08-14 DIAGNOSIS — Z91040 Latex allergy status: Secondary | ICD-10-CM | POA: Diagnosis not present

## 2017-08-14 DIAGNOSIS — Z86711 Personal history of pulmonary embolism: Secondary | ICD-10-CM | POA: Diagnosis not present

## 2017-08-14 DIAGNOSIS — E119 Type 2 diabetes mellitus without complications: Secondary | ICD-10-CM | POA: Insufficient documentation

## 2017-08-14 DIAGNOSIS — Z87891 Personal history of nicotine dependence: Secondary | ICD-10-CM | POA: Insufficient documentation

## 2017-08-14 DIAGNOSIS — Z85830 Personal history of malignant neoplasm of bone: Secondary | ICD-10-CM | POA: Insufficient documentation

## 2017-08-14 DIAGNOSIS — M7989 Other specified soft tissue disorders: Secondary | ICD-10-CM | POA: Diagnosis not present

## 2017-08-14 DIAGNOSIS — M79605 Pain in left leg: Secondary | ICD-10-CM | POA: Diagnosis present

## 2017-08-14 DIAGNOSIS — I110 Hypertensive heart disease with heart failure: Secondary | ICD-10-CM | POA: Insufficient documentation

## 2017-08-14 DIAGNOSIS — Z7982 Long term (current) use of aspirin: Secondary | ICD-10-CM | POA: Diagnosis not present

## 2017-08-14 DIAGNOSIS — Z7984 Long term (current) use of oral hypoglycemic drugs: Secondary | ICD-10-CM | POA: Diagnosis not present

## 2017-08-14 NOTE — ED Triage Notes (Addendum)
PT reports left lower leg pain that radiates into groin. PT denies injury to the leg. No swelling/ redness. Does not take blood thinners. Hx of PE

## 2017-08-14 NOTE — Progress Notes (Signed)
Preliminary results by tech - Left Lower Ext. Venous Duplex Completed. Negative for deep and superficial vein thrombosis.  Naseer Hearn, BS, RDMS, RVT  

## 2017-08-15 ENCOUNTER — Emergency Department (HOSPITAL_COMMUNITY): Payer: Medicare Other

## 2017-08-15 ENCOUNTER — Other Ambulatory Visit: Payer: Self-pay

## 2017-08-15 DIAGNOSIS — M5432 Sciatica, left side: Secondary | ICD-10-CM | POA: Diagnosis not present

## 2017-08-15 MED ORDER — IBUPROFEN 100 MG/5ML PO SUSP
ORAL | Status: AC
  Filled 2017-08-15: qty 5

## 2017-08-15 MED ORDER — HYDROCODONE-ACETAMINOPHEN 5-325 MG PO TABS: 1.0000 | ORAL_TABLET | ORAL | 0 refills | Status: DC | PRN

## 2017-08-15 MED ORDER — IBUPROFEN 800 MG PO TABS: 800.0000 mg | ORAL_TABLET | Freq: Three times a day (TID) | ORAL | 0 refills | Status: DC | PRN

## 2017-08-15 MED ORDER — IBUPROFEN 800 MG PO TABS
800.0000 mg | ORAL_TABLET | Freq: Once | ORAL | Status: AC
  Administered 2017-08-15: 800 mg via ORAL
  Filled 2017-08-15: qty 1

## 2017-08-15 NOTE — ED Provider Notes (Signed)
TIME SEEN: 12:49 AM  CHIEF COMPLAINT: Left leg pain  HPI: Patient is a 57 year old female with history of hypertension, diabetes, CHF, PE little longer on Coumadin who presents emergency department with left leg pain for past several days.  Starts in the buttock and radiates into the thigh.  No swelling noted.  Was sent here to rule out DVT.  No chest pain or shortness of breath.  No redness, warmth.  No injury.  Able to ambulate but pain is worse with movement.  No back pain.  No numbness or weakness.  No bowel or bladder incontinence.  ROS: See HPI Constitutional: no fever  Eyes: no drainage  ENT: no runny nose   Cardiovascular:  no chest pain  Resp: no SOB  GI: no vomiting GU: no dysuria Integumentary: no rash  Allergy: no hives Musculoskeletal: no leg swelling  Neurological: no slurred speech ROS otherwise negative  PAST MEDICAL HISTORY/PAST SURGICAL HISTORY:  Past Medical History:  Diagnosis Date  . Anginal pain ([place])    a. NL cath in 2008;  b. Myoview 03/2011: dec uptake along mid anterior wall on stress imaging -> ? attenuation vs. ischemia, EF 65%;  c. Echo 04/2011: EF 55-60%, no RWMA, Gr 2 dd  . Anxiety   . Arthritis   . Asthma   . Bone cancer ([employer])   . Cancer ([place])   . CHF (congestive heart failure) ([place])   . CHF (congestive heart failure) ([place])   . Depression   . Diabetes mellitus   . Dyspnea    with exertion  . Frequent urination   . GERD (gastroesophageal reflux disease)   . Headache   . Hypertension   . 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 Obesity   . Pulmonary edema   . Pulmonary embolism ([place])    a. 2008 -> coumadin x 6 mos.  . Sleep apnea    does not use CPAP  . Urinary urgency     MEDICATIONS:  Prior to Admission medications   Medication Sig Start Date End Date Taking? Authorizing Provider  albuterol (PROVENTIL HFA;VENTOLIN HFA) 108 (90 Base) MCG/ACT inhaler Inhale 2 puffs into the lungs every 4 (four) hours as needed for  wheezing or shortness of breath.    [provider]  amLODipine (NORVASC) 5 MG tablet Take 5 mg by mouth daily. 06/08/17   [provider]  aspirin EC 81 MG tablet Take 81 mg by mouth daily.    [provider]  atorvastatin (LIPITOR) 40 MG tablet Take 40 mg by mouth at bedtime.     [provider]  budesonide-formoterol (SYMBICORT) 160-4.5 MCG/ACT inhaler Inhale 2 puffs into the lungs 2 (two) times daily. 05/28/16   Eugenie Filler, MD  carvedilol (COREG CR) 10 MG 24 hr capsule Take 10 mg by mouth daily.    [provider]  cyclobenzaprine (FLEXERIL) 5 MG tablet Take 5 mg by mouth 2 (two) times daily.  12/16/15   [provider]  furosemide (LASIX) 40 MG tablet Take 40 mg by mouth daily.    Rai, Ripudeep K, MD  gabapentin (NEURONTIN) 300 MG capsule Take 2 capsules (600 mg total) by mouth 3 (three) times daily. 05/27/14   Reyne Dumas, MD  glipiZIDE (GLUCOTROL) 5 MG tablet Take 5 mg by mouth 2 (two) times daily. 06/04/17   [provider]  HYDROcodone-acetaminophen (NORCO/VICODIN) 5-325 MG tablet Take 1 tablet by mouth every 8 (eight) hours as needed  for moderate pain. 10/19/16   Meredith Pel, MD  HYDROcodone-acetaminophen (NORCO/VICODIN) 5-325 MG tablet Take 1 tablet by mouth every 12 (twelve) hours as needed for moderate pain. Patient not taking: Reported on 06/11/2017 11/17/16   Meredith Pel, MD  ipratropium-albuterol (DUONEB) 0.5-2.5 (3) MG/3ML SOLN Take 3 mLs by nebulization 3 (three) times daily. Use 3 times daily for 4 days, then 4-6 hours as needed. Stop date 09/04/16    [provider]  isosorbide dinitrate (ISORDIL) 20 MG tablet Take 20 mg by mouth 2 (two) times daily.     [provider]  KLOR-CON M10 10 MEQ tablet Take 10 mEq by mouth 2 (two) times daily.  09/23/13   [provider]  KOMBIGLYZE XR 5-500 MG TB24 Take 1 tablet by mouth every morning.  10/11/15   [provider]  lisinopril  (PRINIVIL,ZESTRIL) 20 MG tablet Take 20 mg by mouth daily. Take 2 tabs to = 20 mg qd 09/21/14   [provider]  loratadine (CLARITIN) 10 MG tablet Take 1 tablet (10 mg total) by mouth daily. Patient taking differently: Take 10 mg by mouth daily as needed.  05/28/16   Eugenie Filler, MD  meloxicam (MOBIC) 7.5 MG tablet Take 7.5 mg by mouth daily. 05/17/17   [provider]  Multiple Vitamin (MULTIVITAMIN) capsule Take 1 capsule by mouth daily.    [provider]  MYRBETRIQ 25 MG TB24 tablet Take 25 mg by mouth daily. 05/23/17   [provider]  nitroGLYCERIN (NITROSTAT) 0.4 MG SL tablet Place 0.4 mg under the tongue every 5 (five) minutes as needed. For chest pain.    [provider]  omeprazole (PRILOSEC) 20 MG capsule Take 20 mg by mouth 2 (two) times daily. 10/05/16   [provider]  ondansetron (ZOFRAN ODT) 4 MG disintegrating tablet Take 1 tablet (4 mg total) by mouth every 8 (eight) hours as needed for nausea or vomiting. 07/28/17   Gregor Hams, MD  oxyCODONE (OXY IR/ROXICODONE) 5 MG immediate release tablet Take 1-2 tablets (5-10 mg total) by mouth every 3 (three) hours as needed for breakthrough pain. 08/25/16   Meredith Pel, MD  pantoprazole (PROTONIX) 40 MG tablet Take 1 tablet (40 mg total) by mouth daily. 05/27/14   Reyne Dumas, MD  sertraline (ZOLOFT) 100 MG tablet Take 100 mg by mouth daily.     [provider]    ALLERGIES:  Allergies  Allergen Reactions  . Penicillins Swelling    Has patient had a PCN reaction causing immediate rash, facial/tongue/throat swelling, SOB or lightheadedness with hypotension: YES Has patient had a PCN reaction causing severe rash involving mucus membranes or skin necrosis: NO Has patient had a PCN reaction that required hospitalization NO Has patient had a PCN reaction occurring within the last 10 years: NO If all of the above answers are "NO", then may proceed with Cephalosporin  use.  . Latex Rash  . Sulfa Antibiotics Diarrhea, Itching and Rash    SOCIAL HISTORY:  Social History   Tobacco Use  . Smoking status: Former Smoker    Packs/day: 0.50    Years: 15.00    Pack years: 7.50    Types: Cigarettes    Last attempt to quit: 04/24/1985    Years since quitting: 32.3  . Smokeless tobacco: Never Used  Substance Use Topics  . Alcohol use: No    Alcohol/week: 0.0 oz    FAMILY HISTORY: Family History  Problem Relation Age of  Onset  . Emphysema Mother   . Arthritis Mother   . Heart failure Mother        alive @ 48  . Stroke Mother   . Asthma Brother   . Heart disease Father        died @ 30's.  . Stroke Father   . Diabetes Sister   . Heart disease Paternal Grandfather   . Colon cancer Maternal Grandfather   . Breast cancer Maternal Aunt     EXAM: BP 125/67 (BP Location: Right Arm)   Pulse 73   Temp 98.2 F (36.8 C) (Oral)   Resp 12   Ht 4\' 11"  (1.499 m)   Wt 99.8 kg (220 lb)   SpO2 96%   BMI 44.43 kg/m  CONSTITUTIONAL: Alert and oriented and responds appropriately to questions. Well-appearing; well-nourished HEAD: Normocephalic EYES: Conjunctivae clear, pupils appear equal, EOMI ENT: normal nose; moist mucous membranes NECK: Supple, no meningismus, no nuchal rigidity, no LAD  CARD: RRR; S1 and S2 appreciated; no murmurs, no clicks, no rubs, no gallops RESP: Normal chest excursion without splinting or tachypnea; breath sounds clear and equal bilaterally; no wheezes, no rhonchi, no rales, no hypoxia or respiratory distress, speaking full sentences ABD/GI: Normal bowel sounds; non-distended; soft, non-tender, no rebound, no guarding, no peritoneal signs, no hepatosplenomegaly BACK:  The back appears normal and is non-tender to palpation, there is no CVA tenderness EXT: Tender to palpation over the posterior left buttocks and left lateral hip and over the left IT band.  There is no swelling, ecchymosis, redness, warmth or deformity noted.  2+ DP  pulse on the left side.  Extremities warm well perfused.  No joint effusion.  Normal ROM in all joints; otherwise extremities are non-tender to palpation; no edema; normal capillary refill; no cyanosis, no calf tenderness or swelling    SKIN: Normal color for age and race; warm; no rash NEURO: Moves all extremities equally, normal sensation diffusely, normal gait PSYCH: The patient's mood and manner are appropriate. Grooming and personal hygiene are appropriate.  MEDICAL DECISION MAKING: Patient here with left leg pain.  Sent here to rule out DVT.  Venous Doppler ordered in triage which is negative.  X-ray obtained of the left hip which shows no acute abnormality.  I suspect that this is likely sciatica.  She has no focal neurologic deficits.  She did drive herself to the emergency department.  Given dose of ibuprofen here will discharge with ibuprofen, Vicodin and has been instructed her to follow-up with her primary care physician closely.  I do not feel she needs further emergent workup at this time.  No sign of arterial obstruction, cellulitis, gout, septic arthritis, compartment syndrome, necrotizing fasciitis.  Doubt cauda equina, epidural abscess or hematoma, discitis or osteomyelitis, transverse myelitis.  Doubt fracture.  I feel she is safe for discharge home.  She is comfortable with this plan.   At this time, I do not feel there is any life-threatening condition present. I have reviewed and discussed all results (EKG, imaging, lab, urine as appropriate) and exam findings with patient/family. I have reviewed nursing notes and appropriate previous records.  I feel the patient is safe to be discharged home without further emergent workup and can continue workup as an outpatient as needed. Discussed usual and customary return precautions. Patient/family verbalize understanding and are comfortable with this plan.  Outpatient follow-up has been provided if needed. All questions have been  answered.      Kaz Auld, Delice Bison, DO  08/15/17 0201  

## 2017-08-15 NOTE — ED Notes (Signed)
Discharge instructions discussed with Pt. Pt verbalized understanding. Pt stable and ambulatory.    

## 2017-09-04 ENCOUNTER — Ambulatory Visit (INDEPENDENT_AMBULATORY_CARE_PROVIDER_SITE_OTHER): Payer: Medicare Other | Admitting: Adult Health

## 2017-09-04 ENCOUNTER — Encounter: Payer: Self-pay | Admitting: Adult Health

## 2017-09-04 VITALS — BP 163/93 | HR 74 | Ht 59.0 in | Wt 233.0 lb

## 2017-09-04 DIAGNOSIS — Z6841 Body Mass Index (BMI) 40.0 and over, adult: Secondary | ICD-10-CM

## 2017-09-04 DIAGNOSIS — G4733 Obstructive sleep apnea (adult) (pediatric): Secondary | ICD-10-CM | POA: Diagnosis not present

## 2017-09-04 DIAGNOSIS — Z9989 Dependence on other enabling machines and devices: Secondary | ICD-10-CM | POA: Diagnosis not present

## 2017-09-04 NOTE — Patient Instructions (Signed)
Your Plan:  Try using CPAP nightly and >4 hours each night Set an alarm if you are falling asleep before you put on CPAP Referral to Healthy weight and wellness If your symptoms worsen or you develop new symptoms please let us know.   Thank you for coming to see [name] at [facility]. I hope we have been able to provide you high quality care today.  You may receive a patient satisfaction survey over the next few weeks. We would appreciate your feedback and comments so that we may continue to improve ourselves and the health of our patients.

## 2017-09-04 NOTE — Progress Notes (Signed)
PATIENT: [name] DOB: May 10, 1960  REASON FOR VISIT: follow up HISTORY FROM: patient  HISTORY OF PRESENT ILLNESS: Today 09/04/17 [name] is a 57 year old female with a history of obstructive sleep.  She returns today for follow-up.  Her download indicates that she use her machine 51 out of 90 days for compliance of 57%.  She use her machine greater than 4 hours 39 out of 51 days for compliance of 43%.  On average she uses her machine 5 hours and 51 minutes.  Her residual AHI is 0.9 on 16 cm of water with EPR 3.  Her leak in the 95th percentile is 29.4 L/min.  She states that some nights she falls asleep before putting her machine on.  She states other nights she wakes up and she has taken the mask off during the night.  She states that she is also struggling with weight loss.  She states that her weight continues to fluctuate.  Her Epworth sleepiness score is 10.  She returns today for evaluation.    HISTORY [name] is a 57 year old female with a history of obstructive sleep apnea on CPAP. She returns today for a compliance download to fulfill requirements by her DME/insurance company. Her download indicates that she uses her machine 30 out of 30 days for compliance of 100%. She uses her machine greater than 4 hours 23 out of 30 days for compliance of 77%. On average she uses her machine 5 hours and 47 minutes. Her residual AHI is 0.5 on 16 cm water with EPR 3. She does have a significant leak in the 95th percentile at 41.1 L/m. She states that she wears the nasal mask. She does notice that it has been leaking. She does try to change out her supplies regularly. She returns today for an evaluation.    REVIEW OF SYSTEMS: Out of a complete 14 system review of symptoms, the patient complains only of the following symptoms, and all other reviewed systems are negative.  See HPI  ALLERGIES: Allergies  Allergen Reactions  . Penicillins Swelling    Has patient had a PCN  reaction causing immediate rash, facial/tongue/throat swelling, SOB or lightheadedness with hypotension: YES Has patient had a PCN reaction causing severe rash involving mucus membranes or skin necrosis: NO Has patient had a PCN reaction that required hospitalization NO Has patient had a PCN reaction occurring within the last 10 years: NO If all of the above answers are "NO", then may proceed with Cephalosporin use.  . Latex Rash  . Sulfa Antibiotics Diarrhea, Itching and Rash    HOME MEDICATIONS: Outpatient Medications Prior to Visit  Medication Sig Dispense Refill  . albuterol (PROVENTIL HFA;VENTOLIN HFA) 108 (90 Base) MCG/ACT inhaler Inhale 2 puffs into the lungs every 4 (four) hours as needed for wheezing or shortness of breath.    [name] amLODipine (NORVASC) 5 MG tablet Take 5 mg by mouth daily.  6  . aspirin EC 81 MG tablet Take 81 mg by mouth daily.    [name] atorvastatin (LIPITOR) 40 MG tablet Take 40 mg by mouth at bedtime.     . budesonide-formoterol (SYMBICORT) 160-4.5 MCG/ACT inhaler Inhale 2 puffs into the lungs 2 (two) times daily. 1 Inhaler 3  . carvedilol (COREG CR) 10 MG 24 hr capsule Take 10 mg by mouth daily.    . cyclobenzaprine (FLEXERIL) 5 MG tablet Take 5 mg by mouth 2 (two) times daily.     . furosemide (LASIX) 40 MG tablet Take  40 mg by mouth daily.    [name] gabapentin (NEURONTIN) 300 MG capsule Take 2 capsules (600 mg total) by mouth 3 (three) times daily. 180 capsule 2  . glipiZIDE (GLUCOTROL) 5 MG tablet Take 5 mg by mouth 2 (two) times daily.  1  . HYDROcodone-acetaminophen (NORCO/VICODIN) 5-325 MG tablet Take 1 tablet by mouth every 4 (four) hours as needed. 15 tablet 0  . ibuprofen (ADVIL,MOTRIN) 800 MG tablet Take 1 tablet (800 mg total) by mouth every 8 (eight) hours as needed for mild pain. 30 tablet 0  . ipratropium-albuterol (DUONEB) 0.5-2.5 (3) MG/3ML SOLN Take 3 mLs by nebulization every 4 (four) hours as needed (for breathing).     . isosorbide dinitrate (ISORDIL) 20  MG tablet Take 20 mg by mouth 2 (two) times daily.     [name] KLOR-CON M10 10 MEQ tablet Take 10 mEq by mouth 2 (two) times daily.     [name] KOMBIGLYZE XR 5-500 MG TB24 Take 1 tablet by mouth every morning.     [name] lisinopril (PRINIVIL,ZESTRIL) 20 MG tablet Take 20 mg by mouth daily.     [name] loratadine (CLARITIN) 10 MG tablet Take 1 tablet (10 mg total) by mouth daily. (Patient taking differently: Take 10 mg by mouth daily as needed for allergies. ) 30 tablet 3  . meloxicam (MOBIC) 7.5 MG tablet Take 7.5 mg by mouth daily.  0  . Multiple Vitamin (MULTIVITAMIN) capsule Take 1 capsule by mouth daily.    [name] MYRBETRIQ 25 MG TB24 tablet Take 25 mg by mouth daily.  5  . nitroGLYCERIN (NITROSTAT) 0.4 MG SL tablet Place 0.4 mg under the tongue every 5 (five) minutes as needed. For chest pain.    [name] omeprazole (PRILOSEC) 20 MG capsule Take 20 mg by mouth 2 (two) times daily.    . pantoprazole (PROTONIX) 40 MG tablet Take 1 tablet (40 mg total) by mouth daily. 30 tablet 0  . sertraline (ZOLOFT) 100 MG tablet Take 100 mg by mouth daily.      No facility-administered medications prior to visit.     PAST MEDICAL HISTORY: Past Medical History:  Diagnosis Date  . Anginal pain ([place])    a. NL cath in 2008;  b. Myoview 03/2011: dec uptake along mid anterior wall on stress imaging -> ? attenuation vs. ischemia, EF 65%;  c. Echo 04/2011: EF 55-60%, no RWMA, Gr 2 dd  . Anxiety   . Arthritis   . Asthma   . Bone cancer ([place])   . Cancer ([place])   . CHF (congestive heart failure) ([place])   . CHF (congestive heart failure) ([place])   . Depression   . Diabetes mellitus   . Dyspnea    with exertion  . Frequent urination   . GERD (gastroesophageal reflux disease)   . Headache   . Hypertension   . 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 Obesity   . Pneumonia 05/2016   double  . Pulmonary edema   . Pulmonary embolism ([place])    a. 2008 -> coumadin x 6 mos.  . Sleep apnea    does not use CPAP  . Urinary urgency      PAST SURGICAL HISTORY: Past Surgical History:  Procedure Laterality Date  . ABDOMINAL HYSTERECTOMY  2005  . CARDIAC CATHETERIZATION     Normal  . CARDIAC CATHETERIZATION N/A 09/30/2014   Procedure: Left Heart Cath and Coronary Angiography;  Surgeon: Sherren Mocha, MD;  Location:  [facility];  Service: Cardiovascular;  Laterality: N/A;  . LAPAROSCOPIC APPENDECTOMY N/A 06/03/2012   Procedure: APPENDECTOMY LAPAROSCOPIC;  Surgeon: Stark Klein, MD;  Location: [facility];  Service: General;  Laterality: N/A;  . Left knee surgery  2008  . LEG SURGERY    . TONSILLECTOMY    . TOTAL KNEE ARTHROPLASTY Left 08/22/2016   Procedure: TOTAL KNEE ARTHROPLASTY;  Surgeon: Meredith Pel, MD;  Location: [facility];  Service: Orthopedics;  Laterality: Left;  . TUBAL LIGATION  1989    FAMILY HISTORY: Family History  Problem Relation Age of Onset  . Emphysema Mother   . Arthritis Mother   . Heart failure Mother        alive @ 43  . Stroke Mother   . Asthma Brother   . Heart disease Father        died @ 32's.  . Stroke Father   . Diabetes Sister   . Heart disease Paternal Grandfather   . Colon cancer Maternal Grandfather   . Breast cancer Maternal Aunt     SOCIAL HISTORY: Social History   Socioeconomic History  . Marital status: Legally Separated    Spouse name: Not on file  . Number of children: Not on file  . Years of education: Not on file  . Highest education level: Not on file  Occupational History  . Occupation: UNEMPLOYED    Employer: DISABLED  Social Needs  . Financial resource strain: Not on file  . Food insecurity:    Worry: Not on file    Inability: Not on file  . Transportation needs:    Medical: Not on file    Non-medical: Not on file  Tobacco Use  . Smoking status: Former Smoker    Packs/day: 0.50    Years: 15.00    Pack years: 7.50    Types: Cigarettes    Last attempt to quit: 04/24/1985    Years since quitting: 32.3  . Smokeless tobacco: Never Used    Substance and Sexual Activity  . Alcohol use: No    Alcohol/week: 0.0 oz  . Drug use: No  . Sexual activity: Never  Lifestyle  . Physical activity:    Days per week: Not on file    Minutes per session: Not on file  . Stress: Not on file  Relationships  . Social connections:    Talks on phone: Not on file    Gets together: Not on file    Attends religious service: Not on file    Active member of club or organization: Not on file    Attends meetings of clubs or organizations: Not on file    Relationship status: Not on file  . Intimate partner violence:    Fear of current or ex partner: Not on file    Emotionally abused: Not on file    Physically abused: Not on file    Forced sexual activity: Not on file  Other Topics Concern  . Not on file  Social History Narrative   Lives in [place] by herself.  Disabled.  Caffeine 1 cup avg daily.  3 kids, 6 grandkids.        PHYSICAL EXAM  Vitals:   09/04/17 1308  BP: (!) 163/93  Pulse: 74  Weight: 233 lb (105.7 kg)  Height: 4\' 11"  (1.499 m)   Body mass index is 47.06 kg/m.  Generalized: Well developed, in no acute distress   Neurological examination  Mentation: Alert oriented to time, place,  history taking. Follows all commands speech and language fluent Cranial nerve II-XII: Pupils were equal round reactive to light. Extraocular movements were full, visual field were full on confrontational test. Facial sensation and strength were normal. Uvula tongue midline. Head turning and shoulder shrug  were normal and symmetric.  Neck circumference 18-1/2 inches, Mallampati 3+ Motor: The motor testing reveals 5 over 5 strength of all 4 extremities. Good symmetric motor tone is noted throughout.  Sensory: Sensory testing is intact to soft touch on all 4 extremities. No evidence of extinction is noted.  Coordination: Cerebellar testing reveals good finger-nose-finger and heel-to-shin bilaterally.  Gait and station: Gait is normal.  Reflexes:  Deep tendon reflexes are symmetric and normal bilaterally.   DIAGNOSTIC DATA (LABS, IMAGING, TESTING) - I reviewed patient records, labs, notes, testing and imaging myself where available.  Lab Results  Component Value Date   WBC 9.5 07/27/2017   HGB 13.7 07/27/2017   HCT 40.6 07/27/2017   MCV 83.4 07/27/2017   PLT 200 07/27/2017      Component Value Date/Time   NA 144 07/27/2017 2248   NA 143 10/12/2016 1139   K 3.6 07/27/2017 2248   CL 109 07/27/2017 2248   CO2 28 07/27/2017 2248   GLUCOSE 152 (H) 07/27/2017 2248   BUN 14 07/27/2017 2248   BUN 7 10/12/2016 1139   CREATININE 0.90 07/27/2017 2248   CALCIUM 9.0 07/27/2017 2248   PROT 7.5 07/27/2017 2248   PROT 6.7 06/18/2014 0930   PROT 6.7 06/18/2014 0930   ALBUMIN 4.1 07/27/2017 2248   AST 36 07/27/2017 2248   ALT 57 (H) 07/27/2017 2248   ALKPHOS 66 07/27/2017 2248   BILITOT 0.6 07/27/2017 2248   GFRNONAA >60 07/27/2017 2248   GFRAA >60 07/27/2017 2248   Lab Results  Component Value Date   CHOL 173 04/23/2011   CHOL 171 04/23/2011   HDL 41 04/23/2011   HDL 40 04/23/2011   LDLCALC 103 (H) 04/23/2011   LDLCALC 102 (H) 04/23/2011   TRIG 147 04/23/2011   TRIG 143 04/23/2011   CHOLHDL 4.2 04/23/2011   CHOLHDL 4.3 04/23/2011   Lab Results  Component Value Date   HGBA1C 7.4 (H) 08/11/2016   Lab Results  Component Value Date   [id number] 676 05/25/2014   Lab Results  Component Value Date   TSH 0.112 (L) 05/24/2016      ASSESSMENT AND PLAN 57 y.o. year old female  has a past medical history of Anginal pain ([place]), Anxiety, Arthritis, Asthma, Bone cancer ([place]), Cancer ([place]), CHF (congestive heart failure) ([place]), CHF (congestive heart failure) ([place]), Depression, Diabetes mellitus, Dyspnea, Frequent urination, GERD (gastroesophageal reflux disease), Headache, Hypertension, Mediastinal mass, Obesity, Pneumonia (05/2016), Pulmonary edema, Pulmonary embolism ([place]), Sleep apnea, and Urinary urgency. here with:    1.   Obstructive sleep apnea on CPAP 2.  Obesity   The patient CPAP download shows suboptimal compliance.  I have encouraged the patient to try to use the CPAP nightly greater than 4 hours each night I reviewed the risks associated with untreated sleep apnea.  I will also refer the patient to [facility] for weight management.  Patient is amenable to this plan.  She will follow-up in 6 months or sooner if needed.  I spent 15 minutes with the patient. 50% of this time was spent   Ward Givens, MSN, NP-C 09/04/2017, 1:21 PM [facility] [address] [place], [place] [address] [phone]

## 2017-09-04 NOTE — Progress Notes (Signed)
Community message sent to [facility] re: CPAP order for new supplies.

## 2017-09-05 ENCOUNTER — Encounter (INDEPENDENT_AMBULATORY_CARE_PROVIDER_SITE_OTHER): Payer: Medicare Other

## 2017-09-10 ENCOUNTER — Ambulatory Visit (INDEPENDENT_AMBULATORY_CARE_PROVIDER_SITE_OTHER): Payer: Medicare Other | Admitting: Family Medicine

## 2017-09-12 ENCOUNTER — Ambulatory Visit (INDEPENDENT_AMBULATORY_CARE_PROVIDER_SITE_OTHER): Payer: Medicare Other | Admitting: Orthopedic Surgery

## 2017-09-14 NOTE — Progress Notes (Signed)
I agree with the assessment and plan as directed by NP .The patient is known to me .   Ziyanna Tolin, MD  

## 2017-09-19 ENCOUNTER — Ambulatory Visit (INDEPENDENT_AMBULATORY_CARE_PROVIDER_SITE_OTHER): Payer: Medicare Other | Admitting: Orthopedic Surgery

## 2017-09-19 ENCOUNTER — Encounter (INDEPENDENT_AMBULATORY_CARE_PROVIDER_SITE_OTHER): Payer: Self-pay | Admitting: Orthopedic Surgery

## 2017-09-19 DIAGNOSIS — M5442 Lumbago with sciatica, left side: Secondary | ICD-10-CM | POA: Diagnosis not present

## 2017-09-19 DIAGNOSIS — M25552 Pain in left hip: Secondary | ICD-10-CM

## 2017-09-23 ENCOUNTER — Encounter (INDEPENDENT_AMBULATORY_CARE_PROVIDER_SITE_OTHER): Payer: Self-pay | Admitting: Orthopedic Surgery

## 2017-09-23 NOTE — Progress Notes (Signed)
Office Visit Note   Patient: [name]           Date of Birth: [birth year]           MRN: [id number] Visit Date: 09/19/2017 Requested by: Velna Hatchet, MD [address] [place], [place] [address] PCP: Velna Hatchet, MD  Subjective: Chief Complaint  Patient presents with  . Left Leg - Pain    HPI: [name] is a patient with left leg thigh and groin pain.  Denies any history of injury.  Symptoms have been going on for several months.  Reports radiating leg pain down the anterior aspect of the left leg.  Does not report any numbness or tingling.  Has a lot of groin pain.  Had left total knee replacement done a year ago.  Radiographs of the left hip are on the system from ER visit in April.  Takes ibuprofen for symptoms which do not help.              ROS: All systems reviewed are negative as they relate to the chief complaint within the history of present illness.  Patient denies  fevers or chills.   Assessment & Plan: Visit Diagnoses:  1. Pain in left hip   2. Left-sided low back pain with left-sided sciatica, unspecified chronicity     Plan: Impression is back and hip pain affecting left hand side.  Hard to know if this is occult arthritis is not visible on plain radiographs of the left hip or if this is radiating pain from the back.  Is been going on for several months and has failed conservative treatment.  Plan is for diagnostic injection into the left hip followed by MRI scan of the lumbar spine.  I will see her back after both those things have been done.  Continue with ibuprofen for now.  Follow-Up Instructions: Return for after MRI.   Orders:  Orders Placed This Encounter  Procedures  . MR Lumbar Spine w/o contrast  . Ambulatory referral to Physical Medicine Rehab   No orders of the defined types were placed in this encounter.     Procedures: No procedures performed   Clinical Data: No additional findings.  Objective: Vital Signs: There were no  vitals taken for this visit.  Physical Exam:   Constitutional: Patient appears well-developed HEENT:  Head: Normocephalic Eyes:EOM are normal Neck: Normal range of motion Cardiovascular: Normal rate Pulmonary/chest: Effort normal Neurologic: Patient is alert Skin: Skin is warm Psychiatric: Patient has normal mood and affect    Ortho Exam: Orthopedic exam demonstrates full active and passive range of motion of hip joints bilaterally.  Not much in the way of groin pain on the right with internal rotation but slight pulling pain on the left with internal rotation.  No definite paresthesias below the right-hand side mild paresthesias L5 distribution on the left.  Some pain with forward and lateral bending.  Patient has no trochanteric tenderness on either side.  Specialty Comments:  No specialty comments available.  Imaging: No results found.   PMFS History: Patient Active Problem List   Diagnosis Date Noted  . 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 Presence of left artificial knee joint 09/20/2016  . Arthritis of knee 08/22/2016  . Cervical radiculopathy 07/26/2016  . Cervical disc disorder with radiculopathy 07/26/2016  . Right arm pain 06/29/2016  . Cervicalgia 06/29/2016  . Primary osteoarthritis of left knee 06/29/2016  . Hypersomnia with sleep apnea 11/18/2015  . Lethargy 11/18/2015  .  Chest pain 09/30/2014  . Abnormal cardiac function test 09/29/2014  . Chest pain with moderate risk for cardiac etiology 09/28/2014  . HTN (hypertension) 09/28/2014  . Hypokalemia   . Paresthesia 06/18/2014  . Numbness and tingling of left arm and leg   . Unstable angina ([place]) 05/24/2014  . OSA on CPAP 05/24/2014  . Diabetes mellitus type 2 in obese ([place]) 10/29/2012  . S/P laparoscopic appendectomy 06/04/2012  . Diastolic CHF ([place]) 76/28/3151  . Mediastinal mass 01/09/2012  . SOB (shortness of breath) 06/20/2011  . Mediastinal abnormality 06/20/2011  . Dyslipidemia 12/23/2009    . GLAUCOMA 12/23/2009  . ARTHRITIS 12/23/2009  . 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 Morbid obesity-BMI 45 09/13/2006  . ANXIETY STATE NOS 09/13/2006  . DISORDER, DEPRESSIVE NEC 09/13/2006  . CARPAL TUNNEL SYNDROME, MILD 09/13/2006  . Unspecified essential hypertension 09/13/2006  . IBS 09/13/2006  . DEGENERATION, LUMBAR/LUMBOSACRAL DISC 09/13/2006  . SYMPTOM, SWELLING/MASS/LUMP IN CHEST 09/13/2006  . PULMONARY EMBOLISM, HX OF 09/13/2006   Past Medical History:  Diagnosis Date  . Anginal pain ([place])    a. NL cath in 2008;  b. Myoview 03/2011: dec uptake along mid anterior wall on stress imaging -> ? attenuation vs. ischemia, EF 65%;  c. Echo 04/2011: EF 55-60%, no RWMA, Gr 2 dd  . Anxiety   . Arthritis   . Asthma   . Bone cancer ([place])   . Cancer ([place])   . CHF (congestive heart failure) ([place])   . CHF (congestive heart failure) ([place])   . Depression   . Diabetes mellitus   . Dyspnea    with exertion  . Frequent urination   . GERD (gastroesophageal reflux disease)   . Headache   . Hypertension   . 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 Obesity   . Pneumonia 05/2016   double  . Pulmonary edema   . Pulmonary embolism ([place])    a. 2008 -> coumadin x 6 mos.  . Sleep apnea    does not use CPAP  . Urinary urgency     Family History  Problem Relation Age of Onset  . Emphysema Mother   . Arthritis Mother   . Heart failure Mother        alive @ 26  . Stroke Mother   . Asthma Brother   . Heart disease Father        died @ 28's.  . Stroke Father   . Diabetes Sister   . Heart disease Paternal Grandfather   . Colon cancer Maternal Grandfather   . Breast cancer Maternal Aunt     Past Surgical History:  Procedure Laterality Date  . ABDOMINAL HYSTERECTOMY  2005  . CARDIAC CATHETERIZATION     Normal  . CARDIAC CATHETERIZATION N/A 09/30/2014   Procedure: Left Heart Cath and Coronary Angiography;  Surgeon: Sherren Mocha, MD;  Location: [facility];  Service:  Cardiovascular;  Laterality: N/A;  . LAPAROSCOPIC APPENDECTOMY N/A 06/03/2012   Procedure: APPENDECTOMY LAPAROSCOPIC;  Surgeon: Stark Klein, MD;  Location: [facility];  Service: General;  Laterality: N/A;  . Left knee surgery  2008  . LEG SURGERY    . TONSILLECTOMY    . TOTAL KNEE ARTHROPLASTY Left 08/22/2016   Procedure: TOTAL KNEE ARTHROPLASTY;  Surgeon: Meredith Pel, MD;  Location: [facility];  Service: Orthopedics;  Laterality: Left;  . TUBAL LIGATION  1989   Social History   Occupational History  . Occupation: UNEMPLOYED  Employer: DISABLED  Tobacco Use  . Smoking status: Former Smoker    Packs/day: 0.50    Years: 15.00    Pack years: 7.50    Types: Cigarettes    Last attempt to quit: 04/24/1985    Years since quitting: 32.4  . Smokeless tobacco: Never Used  Substance and Sexual Activity  . Alcohol use: No    Alcohol/week: 0.0 oz  . Drug use: No  . Sexual activity: Never

## 2017-10-12 ENCOUNTER — Encounter

## 2017-10-12 ENCOUNTER — Ambulatory Visit (INDEPENDENT_AMBULATORY_CARE_PROVIDER_SITE_OTHER): Payer: Medicare Other

## 2017-10-12 ENCOUNTER — Encounter (INDEPENDENT_AMBULATORY_CARE_PROVIDER_SITE_OTHER): Payer: Self-pay | Admitting: Physical Medicine and Rehabilitation

## 2017-10-12 ENCOUNTER — Ambulatory Visit (INDEPENDENT_AMBULATORY_CARE_PROVIDER_SITE_OTHER): Payer: Medicare Other | Admitting: Physical Medicine and Rehabilitation

## 2017-10-12 DIAGNOSIS — M25552 Pain in left hip: Secondary | ICD-10-CM | POA: Diagnosis not present

## 2017-10-12 NOTE — Progress Notes (Signed)
 .  Numeric Pain Rating Scale and Functional Assessment Average Pain 10   In the last MONTH (on 0-10 scale) has pain interfered with the following?  1. General activity like being  able to carry out your everyday physical activities such as walking, climbing stairs, carrying groceries, or moving a chair?  Rating(6)   -Dye Allergies.  

## 2017-10-12 NOTE — Progress Notes (Signed)
CORTASIA SCREWS - 57 y.o. female MRN [id number]  Date of birth: [birth year]  Office Visit Note: Visit Date: 10/12/2017 PCP: Velna Hatchet, MD Referred by: Velna Hatchet, MD  Subjective: Chief Complaint  Patient presents with  . Left Hip - Pain  . Left Thigh - Pain   HPI: [name] is a 57 year old female that I have seen in the past for cervical spine treatment.  She follows for an orthopedic standpoint with Dr. Anderson Malta.  He request diagnostic and therapeutic left anesthetic hip arthrogram with fluoroscopic guidance.  Patient has been having left hip and groin pain now for about 6 weeks.  She reports that standing and walking make the symptoms worse and resting make the symptoms better.  She reports of pain rating of on average 10 out of 10.  She has MRI of the lumbar spine pending.   ROS Otherwise per HPI.  Assessment & Plan: Visit Diagnoses:  1. Pain in left hip     Plan: Findings:  She did get some relief during the anesthetic portion of the injection.    Meds & Orders: No orders of the defined types were placed in this encounter.   Orders Placed This Encounter  Procedures  . Large Joint Inj: L hip joint  . XR C-ARM NO REPORT    Follow-up: Return if symptoms worsen or fail to improve, for Dr. Susie Cassette, MRI review after completion.   Procedures: Large Joint Inj: L hip joint on 10/12/2017 8:32 AM Indications: pain and diagnostic evaluation Details: 22 G needle, anterior approach  Arthrogram: Yes  Medications: 80 mg triamcinolone acetonide 40 MG/ML; 3 mL bupivacaine 0.5 % Outcome: tolerated well, no immediate complications  Arthrogram demonstrated excellent flow of contrast throughout the joint surface without extravasation or obvious defect.  The patient had relief of symptoms during the anesthetic phase of the injection.  Procedure, treatment alternatives, risks and benefits explained, specific risks discussed. Consent was given by the patient.  Immediately prior to procedure a time out was called to verify the correct patient, procedure, equipment, support staff and site/side marked as required. Patient was prepped and draped in the usual sterile fashion.      No notes on file   Clinical History: Cervical spine MR 05/30/1014 There is reversal of the normal cervical lordosis with apex at C4-5. Trace anterolisthesis of C4 on C5 present. Overall, alignment is stable from previous MRI from 2014. Vertebral body heights are maintained. Signal intensity within the vertebral body bone marrow is normal. No focal osseous lesion  C4-5: Left eccentric circumferential degenerative disc osteophyte complex with left-sided uncovertebral spurring and mild left facet hypertrophy. There is resultant moderate to severe left foraminal narrowing. Mild effacement of the left ventral thecal sac present without significant canal stenosis. Mild right foraminal narrowing present as well.  C5-6: Left eccentric degenerative disc osteophyte with uncovertebral hypertrophy and facet arthrosis. There is fairly severe left sided foraminal narrowing. Mild to moderate right foraminal stenosis present as well. No central canal stenosis.  C6-7: Mild diffuse degenerative disc osteophyte complex, a centric to the right, with right-sided uncovertebral spurring and hypertrophy. There is moderate to severe right foraminal stenosis with mild left foraminal narrowing. No significant central canal stenosis.  C7-T1: Mild left-sided facet hypertrophy present. No significant canal or foraminal stenosis.  Overall very similar findings to 2014   She reports that she quit smoking about 32 years ago. Her smoking use included cigarettes. She has a 7.50 pack-year smoking history.  She has never used smokeless tobacco. No results for input(s): HGBA1C, LABURIC in the last 8760 hours.  Objective:  VS:  HT:    WT:   BMI:     BP:   HR: bpm  TEMP: ( )  RESP:  Physical  Exam  Ortho Exam Imaging: No results found.  Past Medical/Family/Surgical/Social History: Medications & Allergies reviewed per EMR, new medications updated. Patient Active Problem List   Diagnosis Date Noted  . 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 Presence of left artificial knee joint 09/20/2016  . Arthritis of knee 08/22/2016  . Cervical radiculopathy 07/26/2016  . Cervical disc disorder with radiculopathy 07/26/2016  . Right arm pain 06/29/2016  . Cervicalgia 06/29/2016  . Primary osteoarthritis of left knee 06/29/2016  . Hypersomnia with sleep apnea 11/18/2015  . Lethargy 11/18/2015  . Chest pain 09/30/2014  . Abnormal cardiac function test 09/29/2014  . Chest pain with moderate risk for cardiac etiology 09/28/2014  . HTN (hypertension) 09/28/2014  . Hypokalemia   . Paresthesia 06/18/2014  . Numbness and tingling of left arm and leg   . Unstable angina ([place]) 05/24/2014  . OSA on CPAP 05/24/2014  . Diabetes mellitus type 2 in obese ([place]) 10/29/2012  . S/P laparoscopic appendectomy 06/04/2012  . Diastolic CHF ([place]) 87/56/4332  . Mediastinal mass 01/09/2012  . SOB (shortness of breath) 06/20/2011  . Mediastinal abnormality 06/20/2011  . Dyslipidemia 12/23/2009  . GLAUCOMA 12/23/2009  . ARTHRITIS 12/23/2009  . 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 Morbid obesity-BMI 45 09/13/2006  . ANXIETY STATE NOS 09/13/2006  . DISORDER, DEPRESSIVE NEC 09/13/2006  . CARPAL TUNNEL SYNDROME, MILD 09/13/2006  . Unspecified essential hypertension 09/13/2006  . IBS 09/13/2006  . DEGENERATION, LUMBAR/LUMBOSACRAL DISC 09/13/2006  . SYMPTOM, SWELLING/MASS/LUMP IN CHEST 09/13/2006  . PULMONARY EMBOLISM, HX OF 09/13/2006   Past Medical History:  Diagnosis Date  . Anginal pain ([place])    a. NL cath in 2008;  b. Myoview 03/2011: dec uptake along mid anterior wall on stress imaging -> ? attenuation vs. ischemia, EF 65%;  c. Echo 04/2011: EF 55-60%, no RWMA, Gr 2 dd  . Anxiety   . Arthritis   .  Asthma   . Bone cancer ([place])   . Cancer ([place])   . CHF (congestive heart failure) ([place])   . CHF (congestive heart failure) ([place])   . Depression   . Diabetes mellitus   . Dyspnea    with exertion  . Frequent urination   . GERD (gastroesophageal reflux disease)   . Headache   . Hypertension   . 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 Obesity   . Pneumonia 05/2016   double  . Pulmonary edema   . Pulmonary embolism ([place])    a. 2008 -> coumadin x 6 mos.  . Sleep apnea    does not use CPAP  . Urinary urgency    Family History  Problem Relation Age of Onset  . Emphysema Mother   . Arthritis Mother   . Heart failure Mother        alive @ 24  . Stroke Mother   . Asthma Brother   . Heart disease Father        died @ 43's.  . Stroke Father   . Diabetes Sister   . Heart disease Paternal Grandfather   . Colon cancer Maternal Grandfather   . Breast cancer Maternal Aunt    Past Surgical History:  Procedure  Laterality Date  . ABDOMINAL HYSTERECTOMY  2005  . CARDIAC CATHETERIZATION     Normal  . CARDIAC CATHETERIZATION N/A 09/30/2014   Procedure: Left Heart Cath and Coronary Angiography;  Surgeon: Sherren Mocha, MD;  Location: [facility];  Service: Cardiovascular;  Laterality: N/A;  . LAPAROSCOPIC APPENDECTOMY N/A 06/03/2012   Procedure: APPENDECTOMY LAPAROSCOPIC;  Surgeon: Stark Klein, MD;  Location: [facility];  Service: General;  Laterality: N/A;  . Left knee surgery  2008  . LEG SURGERY    . TONSILLECTOMY    . TOTAL KNEE ARTHROPLASTY Left 08/22/2016   Procedure: TOTAL KNEE ARTHROPLASTY;  Surgeon: Meredith Pel, MD;  Location: [facility];  Service: Orthopedics;  Laterality: Left;  . TUBAL LIGATION  1989   Social History   Occupational History  . Occupation: UNEMPLOYED    Employer: DISABLED  Tobacco Use  . Smoking status: Former Smoker    Packs/day: 0.50    Years: 15.00    Pack years: 7.50    Types: Cigarettes    Last attempt to quit: 04/24/1985     Years since quitting: 32.4  . Smokeless tobacco: Never Used  Substance and Sexual Activity  . Alcohol use: No    Alcohol/week: 0.0 oz  . Drug use: No  . Sexual activity: Never

## 2017-10-12 NOTE — Patient Instructions (Signed)

## 2017-10-15 MED ORDER — BUPIVACAINE HCL 0.5 % IJ SOLN
3.0000 mL | INTRAMUSCULAR | Status: AC | PRN
  Administered 2017-10-12: 3 mL via INTRA_ARTICULAR

## 2017-10-15 MED ORDER — TRIAMCINOLONE ACETONIDE 40 MG/ML IJ SUSP
80.0000 mg | INTRAMUSCULAR | Status: AC | PRN
  Administered 2017-10-12: 80 mg via INTRA_ARTICULAR

## 2017-10-18 ENCOUNTER — Inpatient Hospital Stay: Admission: RE | Admit: 2017-10-18 | Payer: Medicare Other | Source: Ambulatory Visit

## 2017-10-18 IMAGING — DX DG CHEST 2V
2 series · 2 of 2 positions shown · non-contrast
Comparison: Radiographs 04/05/2015, chest CT 05/24/2014

CLINICAL DATA: Central chest pain.  Shortness of breath.

EXAM:
CHEST  2 VIEW

[chest pa]
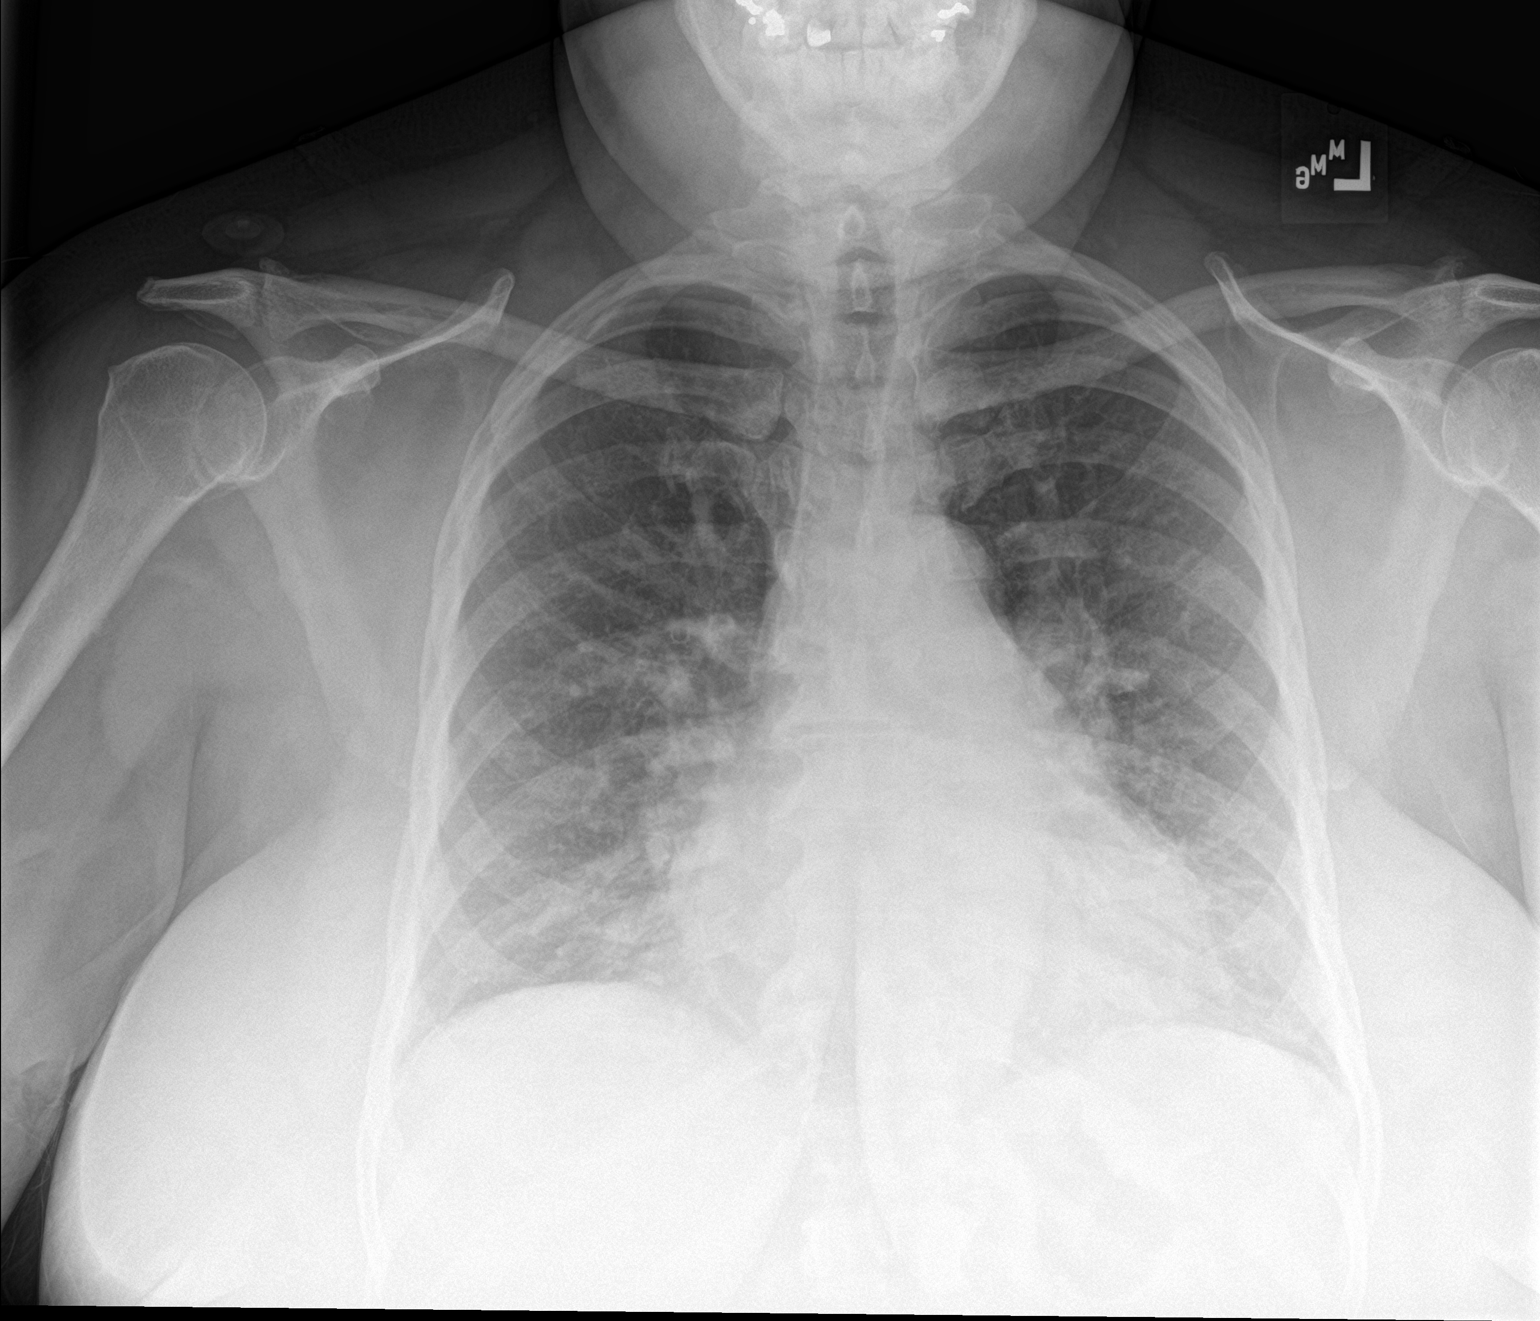

[chest lat]
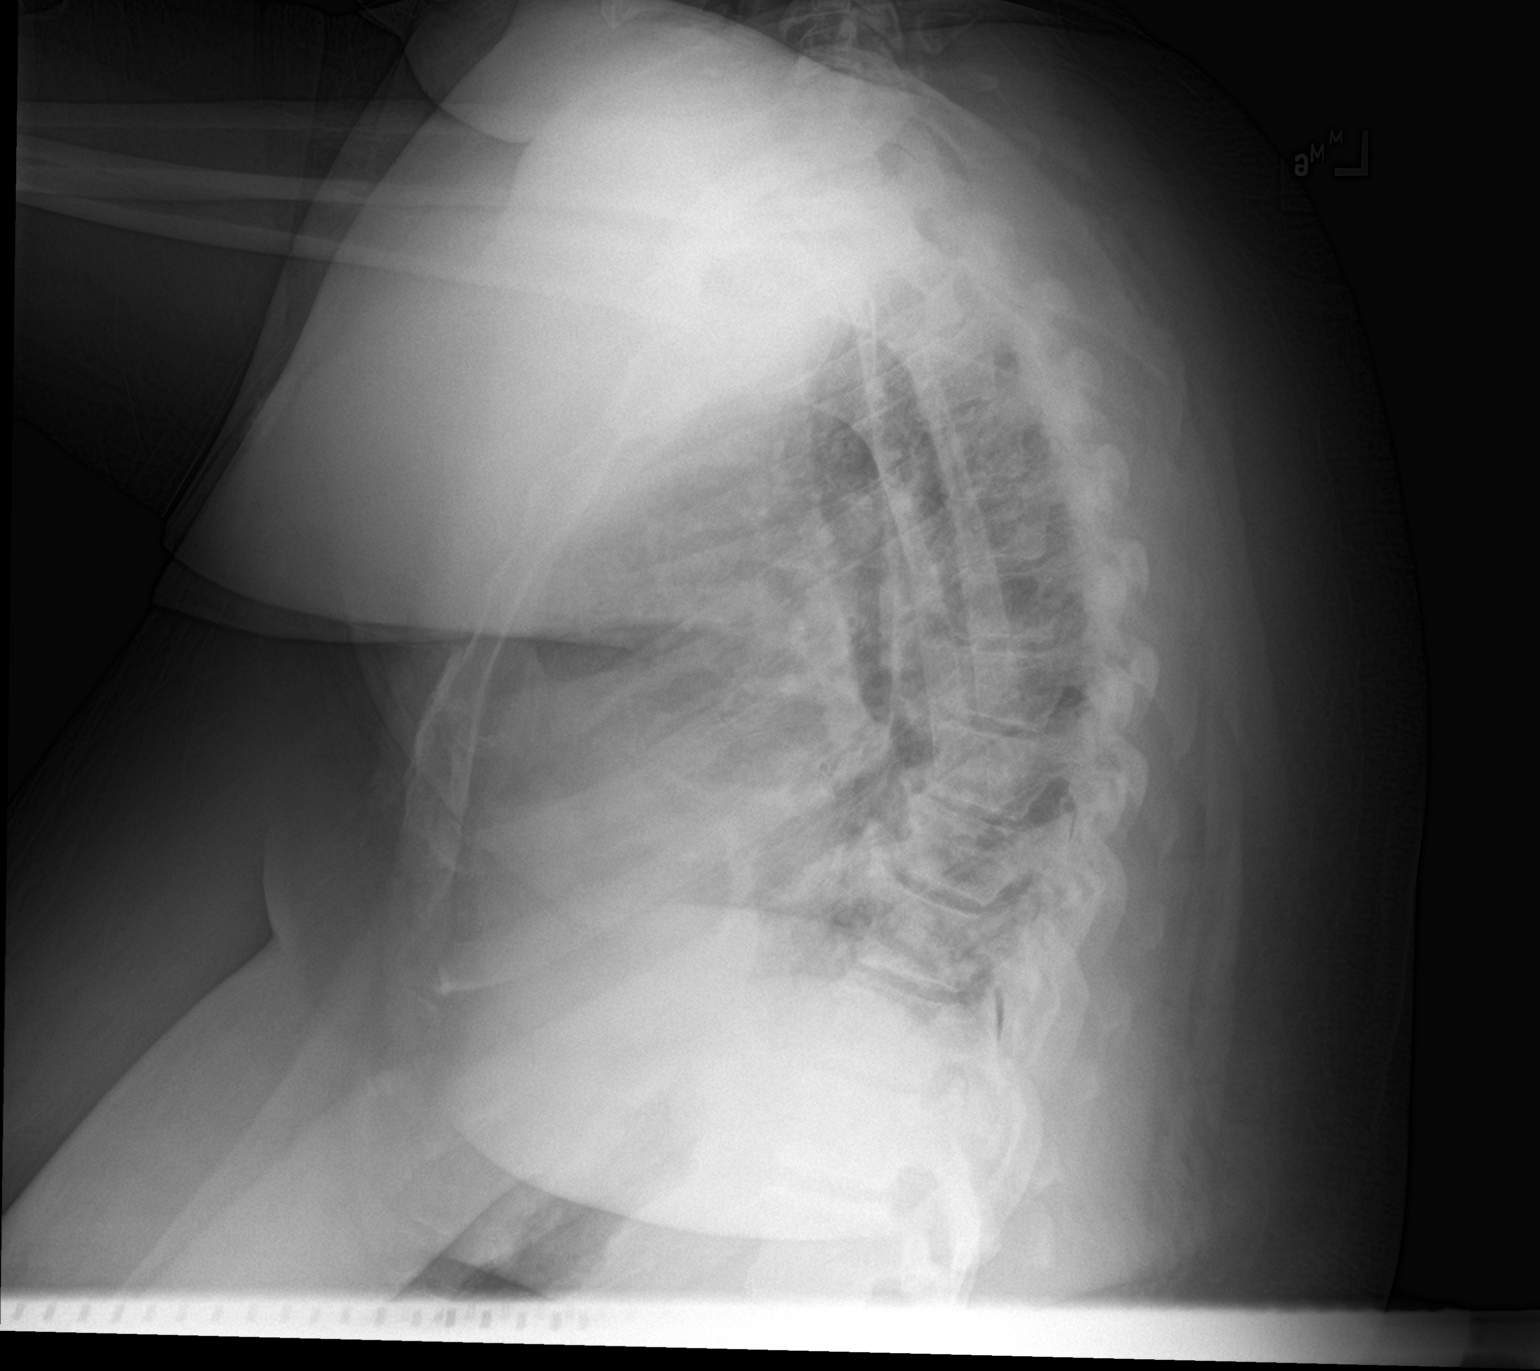

[2 of 2 positions shown; findings below may reference images not displayed]

FINDINGS: Increased peribronchial thickening from prior exam.
Cardiomediastinal contours are unchanged, mild cardiomegaly.
Bibasilar atelectasis. No consolidation, pleural effusion, or
pneumothorax. No acute osseous abnormalities are seen.
IMPRESSION: Peribronchial thickening, increased from prior exam. This may be
secondary to bronchitis (acute, or acute on chronic).

Borderline mild cardiomegaly is unchanged.

## 2017-10-24 ENCOUNTER — Ambulatory Visit (INDEPENDENT_AMBULATORY_CARE_PROVIDER_SITE_OTHER): Payer: Medicare Other | Admitting: Orthopedic Surgery

## 2017-10-27 ENCOUNTER — Ambulatory Visit
Admission: RE | Admit: 2017-10-27 | Discharge: 2017-10-27 | Disposition: A | Discharge status: Home / Self Care | Payer: Medicare Other | Source: Ambulatory Visit | Attending: Orthopedic Surgery | Admitting: Orthopedic Surgery

## 2017-10-27 DIAGNOSIS — M5442 Lumbago with sciatica, left side: Secondary | ICD-10-CM

## 2017-11-02 ENCOUNTER — Ambulatory Visit (INDEPENDENT_AMBULATORY_CARE_PROVIDER_SITE_OTHER): Payer: Medicare Other | Admitting: Orthopedic Surgery

## 2017-11-02 ENCOUNTER — Encounter (INDEPENDENT_AMBULATORY_CARE_PROVIDER_SITE_OTHER): Payer: Self-pay | Admitting: Orthopedic Surgery

## 2017-11-02 DIAGNOSIS — G8929 Other chronic pain: Secondary | ICD-10-CM

## 2017-11-02 DIAGNOSIS — M5442 Lumbago with sciatica, left side: Secondary | ICD-10-CM

## 2017-11-04 ENCOUNTER — Encounter (INDEPENDENT_AMBULATORY_CARE_PROVIDER_SITE_OTHER): Payer: Self-pay | Admitting: Orthopedic Surgery

## 2017-11-04 NOTE — Progress Notes (Signed)
Office Visit Note   Patient: [name]           Date of Birth: [birth year]           MRN: [id number] Visit Date: 11/02/2017 Requested by: Velna Hatchet, MD [address] [place], [place] [address] PCP: Velna Hatchet, MD  Subjective: Chief Complaint  Patient presents with  . Lower Back - Follow-up    HPI: [name] is a patient here to review MRI scan of her lumbar spine.  She has had an injection in her left hip with Dr. Ernestina Patches.  MRI scan shows facet arthritis at L5-S1 moderate moderately severe stenosis at L4-5.  She is going to a program to help her lose weight.  The injection did help her left hip groin pain.              ROS: All systems reviewed are negative as they relate to the chief complaint within the history of present illness.  Patient denies  fevers or chills.   Assessment & Plan: Visit Diagnoses:  1. Chronic bilateral low back pain with left-sided sciatica     Plan: Pression is low back pain and left hip pain.  She does have moderate stenosis.  MRI scan is reviewed with her.  Injection helped the left hip.  At this time is to continue with conservative treatment.  Would like to set up a back injection with Dr. Ernestina Patches so we can figure out how much of her pain is coming from the left hip and how much of it is coming from radiating pain from her bar spine stenosis.  Follow-Up Instructions: Return if symptoms worsen or fail to improve.   Orders:  No orders of the defined types were placed in this encounter.  No orders of the defined types were placed in this encounter.     Procedures: No procedures performed   Clinical Data: No additional findings.  Objective: Vital Signs: There were no vitals taken for this visit.  Physical Exam:   Constitutional: Patient appears well-developed HEENT:  Head: Normocephalic Eyes:EOM are normal Neck: Normal range of motion Cardiovascular: Normal rate Pulmonary/chest: Effort normal Neurologic: Patient is  alert Skin: Skin is warm Psychiatric: Patient has normal mood and affect    Ortho Exam: Ortho exam demonstrates full active and passive range of motion of hips knees and ankles.  Not much groin pain with internal and external rotation of either hip.  No nerve root tension signs today.  Pedal pulses palpable.  Gait normal.  Some pain with forward and lateral bending. Specialty Comments:  No specialty comments available.  Imaging: No results found.   PMFS History: Patient Active Problem List   Diagnosis Date Noted  . 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 Presence of left artificial knee joint 09/20/2016  . Arthritis of knee 08/22/2016  . Cervical radiculopathy 07/26/2016  . Cervical disc disorder with radiculopathy 07/26/2016  . Right arm pain 06/29/2016  . Cervicalgia 06/29/2016  . Primary osteoarthritis of left knee 06/29/2016  . Hypersomnia with sleep apnea 11/18/2015  . Lethargy 11/18/2015  . Chest pain 09/30/2014  . Abnormal cardiac function test 09/29/2014  . Chest pain with moderate risk for cardiac etiology 09/28/2014  . HTN (hypertension) 09/28/2014  . Hypokalemia   . Paresthesia 06/18/2014  . Numbness and tingling of left arm and leg   . Unstable angina ([place]) 05/24/2014  . OSA on CPAP 05/24/2014  . Diabetes mellitus type 2 in obese ([place]) 10/29/2012  . S/P  laparoscopic appendectomy 06/04/2012  . Diastolic CHF ([place]) 15/17/6160  . Mediastinal mass 01/09/2012  . SOB (shortness of breath) 06/20/2011  . Mediastinal abnormality 06/20/2011  . Dyslipidemia 12/23/2009  . GLAUCOMA 12/23/2009  . ARTHRITIS 12/23/2009  . 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 Morbid obesity-BMI 45 09/13/2006  . ANXIETY STATE NOS 09/13/2006  . DISORDER, DEPRESSIVE NEC 09/13/2006  . CARPAL TUNNEL SYNDROME, MILD 09/13/2006  . Unspecified essential hypertension 09/13/2006  . IBS 09/13/2006  . DEGENERATION, LUMBAR/LUMBOSACRAL DISC 09/13/2006  . SYMPTOM, SWELLING/MASS/LUMP IN CHEST 09/13/2006  .  PULMONARY EMBOLISM, HX OF 09/13/2006   Past Medical History:  Diagnosis Date  . Anginal pain ([place])    a. NL cath in 2008;  b. Myoview 03/2011: dec uptake along mid anterior wall on stress imaging -> ? attenuation vs. ischemia, EF 65%;  c. Echo 04/2011: EF 55-60%, no RWMA, Gr 2 dd  . Anxiety   . Arthritis   . Asthma   . Bone cancer ([place])   . Cancer ([place])   . CHF (congestive heart failure) ([place])   . CHF (congestive heart failure) ([place])   . Depression   . Diabetes mellitus   . Dyspnea    with exertion  . Frequent urination   . GERD (gastroesophageal reflux disease)   . Headache   . Hypertension   . 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 Obesity   . Pneumonia 05/2016   double  . Pulmonary edema   . Pulmonary embolism ([place])    a. 2008 -> coumadin x 6 mos.  . Sleep apnea    does not use CPAP  . Urinary urgency     Family History  Problem Relation Age of Onset  . Emphysema Mother   . Arthritis Mother   . Heart failure Mother        alive @ 31  . Stroke Mother   . Asthma Brother   . Heart disease Father        died @ 59's.  . Stroke Father   . Diabetes Sister   . Heart disease Paternal Grandfather   . Colon cancer Maternal Grandfather   . Breast cancer Maternal Aunt     Past Surgical History:  Procedure Laterality Date  . ABDOMINAL HYSTERECTOMY  2005  . CARDIAC CATHETERIZATION     Normal  . CARDIAC CATHETERIZATION N/A 09/30/2014   Procedure: Left Heart Cath and Coronary Angiography;  Surgeon: Sherren Mocha, MD;  Location: [facility];  Service: Cardiovascular;  Laterality: N/A;  . LAPAROSCOPIC APPENDECTOMY N/A 06/03/2012   Procedure: APPENDECTOMY LAPAROSCOPIC;  Surgeon: Stark Klein, MD;  Location: [facility];  Service: General;  Laterality: N/A;  . Left knee surgery  2008  . LEG SURGERY    . TONSILLECTOMY    . TOTAL KNEE ARTHROPLASTY Left 08/22/2016   Procedure: TOTAL KNEE ARTHROPLASTY;  Surgeon: Meredith Pel, MD;  Location: [facility];  Service:  Orthopedics;  Laterality: Left;  . TUBAL LIGATION  1989   Social History   Occupational History  . Occupation: UNEMPLOYED    Employer: DISABLED  Tobacco Use  . Smoking status: Former Smoker    Packs/day: 0.50    Years: 15.00    Pack years: 7.50    Types: Cigarettes    Last attempt to quit: 04/24/1985    Years since quitting: 32.5  . Smokeless tobacco: Never Used  Substance and Sexual Activity  . Alcohol use: No    Alcohol/week: 0.0  oz  . Drug use: No  . Sexual activity: Never

## 2017-11-08 ENCOUNTER — Encounter (INDEPENDENT_AMBULATORY_CARE_PROVIDER_SITE_OTHER): Payer: Medicare Other

## 2017-11-20 ENCOUNTER — Ambulatory Visit (INDEPENDENT_AMBULATORY_CARE_PROVIDER_SITE_OTHER): Payer: Medicare Other | Admitting: Family Medicine

## 2017-11-20 ENCOUNTER — Encounter (INDEPENDENT_AMBULATORY_CARE_PROVIDER_SITE_OTHER): Payer: Self-pay | Admitting: Family Medicine

## 2017-11-20 VITALS — BP 137/81 | HR 80 | Temp 97.6°F | Ht <= 58 in | Wt 225.0 lb

## 2017-11-20 DIAGNOSIS — Z0289 Encounter for other administrative examinations: Secondary | ICD-10-CM

## 2017-11-20 DIAGNOSIS — E559 Vitamin D deficiency, unspecified: Secondary | ICD-10-CM | POA: Insufficient documentation

## 2017-11-20 DIAGNOSIS — F3289 Other specified depressive episodes: Secondary | ICD-10-CM

## 2017-11-20 DIAGNOSIS — F418 Other specified anxiety disorders: Secondary | ICD-10-CM | POA: Insufficient documentation

## 2017-11-20 DIAGNOSIS — R5383 Other fatigue: Secondary | ICD-10-CM | POA: Diagnosis not present

## 2017-11-20 DIAGNOSIS — R0602 Shortness of breath: Secondary | ICD-10-CM | POA: Diagnosis not present

## 2017-11-20 DIAGNOSIS — E119 Type 2 diabetes mellitus without complications: Secondary | ICD-10-CM | POA: Insufficient documentation

## 2017-11-20 DIAGNOSIS — Z6841 Body Mass Index (BMI) 40.0 and over, adult: Secondary | ICD-10-CM | POA: Diagnosis not present

## 2017-11-20 DIAGNOSIS — E1165 Type 2 diabetes mellitus with hyperglycemia: Secondary | ICD-10-CM | POA: Insufficient documentation

## 2017-11-20 DIAGNOSIS — E1169 Type 2 diabetes mellitus with other specified complication: Secondary | ICD-10-CM | POA: Insufficient documentation

## 2017-11-20 DIAGNOSIS — I5032 Chronic diastolic (congestive) heart failure: Secondary | ICD-10-CM

## 2017-11-20 DIAGNOSIS — F32A Depression, unspecified: Secondary | ICD-10-CM | POA: Insufficient documentation

## 2017-11-20 DIAGNOSIS — F329 Major depressive disorder, single episode, unspecified: Secondary | ICD-10-CM | POA: Insufficient documentation

## 2017-11-20 DIAGNOSIS — E7849 Other hyperlipidemia: Secondary | ICD-10-CM | POA: Insufficient documentation

## 2017-11-20 DIAGNOSIS — F419 Anxiety disorder, unspecified: Secondary | ICD-10-CM | POA: Insufficient documentation

## 2017-11-21 LAB — MICROALBUMIN / CREATININE URINE RATIO
Creatinine, Urine: 103.1 mg/dL
Microalb/Creat Ratio: 5.8 mg/g creat (ref 0.0–30.0)
Microalbumin, Urine: 6 ug/mL

## 2017-11-21 LAB — CBC WITH DIFFERENTIAL
Basophils Absolute: 0 10*3/uL (ref 0.0–0.2)
Basos: 1 %
EOS (ABSOLUTE): 0.4 10*3/uL (ref 0.0–0.4)
Eos: 7 %
Hematocrit: 40.6 % (ref 34.0–46.6)
Hemoglobin: 12.8 g/dL (ref 11.1–15.9)
Immature Grans (Abs): 0 10*3/uL (ref 0.0–0.1)
Immature Granulocytes: 0 %
Lymphocytes Absolute: 2.1 10*3/uL (ref 0.7–3.1)
Lymphs: 33 %
MCH: 26.8 pg (ref 26.6–33.0)
MCHC: 31.5 g/dL (ref 31.5–35.7)
MCV: 85 fL (ref 79–97)
Monocytes Absolute: 0.5 10*3/uL (ref 0.1–0.9)
Monocytes: 8 %
Neutrophils Absolute: 3.4 10*3/uL (ref 1.4–7.0)
Neutrophils: 51 %
RBC: 4.78 x10E6/uL (ref 3.77–5.28)
RDW: 13.9 % (ref 12.3–15.4)
WBC: 6.5 10*3/uL (ref 3.4–10.8)

## 2017-11-21 LAB — LIPID PANEL WITH LDL/HDL RATIO
Cholesterol, Total: 158 mg/dL (ref 100–199)
HDL: 53 mg/dL (ref >39)
LDL Calculated: 59 mg/dL (ref 0–99)
LDl/HDL Ratio: 1.1 ratio (ref 0.0–3.2)
Triglycerides: 228 mg/dL — ABNORMAL HIGH (ref 0–149)
VLDL Cholesterol Cal: 46 mg/dL — ABNORMAL HIGH (ref 5–40)

## 2017-11-21 LAB — COMPREHENSIVE METABOLIC PANEL
ALT: 53 IU/L — ABNORMAL HIGH (ref 0–32)
AST: 33 IU/L (ref 0–40)
Albumin/Globulin Ratio: 1.6 (ref 1.2–2.2)
Albumin: 4.3 g/dL (ref 3.5–5.5)
Alkaline Phosphatase: 87 IU/L (ref 39–117)
BUN/Creatinine Ratio: 10 (ref 9–23)
BUN: 9 mg/dL (ref 6–24)
Bilirubin Total: 0.4 mg/dL (ref 0.0–1.2)
CO2: 26 mmol/L (ref 20–29)
Calcium: 9.9 mg/dL (ref 8.7–10.2)
Chloride: 98 mmol/L (ref 96–106)
Creatinine, Ser: 0.86 mg/dL (ref 0.57–1.00)
GFR calc Af Amer: 86 mL/min/{1.73_m2} (ref >59)
GFR calc non Af Amer: 75 mL/min/{1.73_m2} (ref >59)
Globulin, Total: 2.7 g/dL (ref 1.5–4.5)
Glucose: 158 mg/dL — ABNORMAL HIGH (ref 65–99)
Potassium: 3.5 mmol/L (ref 3.5–5.2)
Sodium: 141 mmol/L (ref 134–144)
Total Protein: 7 g/dL (ref 6.0–8.5)

## 2017-11-21 LAB — TSH: TSH: 0.638 u[IU]/mL (ref 0.450–4.500)

## 2017-11-21 LAB — T4, FREE: Free T4: 1.39 ng/dL (ref 0.82–1.77)

## 2017-11-21 LAB — INSULIN, RANDOM: INSULIN: 53.9 u[IU]/mL — ABNORMAL HIGH (ref 2.6–24.9)

## 2017-11-21 LAB — VITAMIN D 25 HYDROXY (VIT D DEFICIENCY, FRACTURES): Vit D, 25-Hydroxy: 14.7 ng/mL — ABNORMAL LOW (ref 30.0–100.0)

## 2017-11-21 LAB — T3: T3, Total: 130 ng/dL (ref 71–180)

## 2017-11-21 LAB — HEMOGLOBIN A1C
Est. average glucose Bld gHb Est-mCnc: 206 mg/dL
Hgb A1c MFr Bld: 8.8 % — ABNORMAL HIGH (ref 4.8–5.6)

## 2017-11-21 NOTE — Progress Notes (Unsigned)
Office: [phone]  /  Fax: [phone] Date: December 04, 2017 Time Seen:*** Duration:*** Provider: Glennie Isle, PsyD Type of Session: Intake for Individual Therapy   Informed Consent:The provider's role was explained to [name]. The provider discussed issues of confidentiality, privacy, and limits therein; anticipated course of treatment; potential risks involved with psychotherapy; the voluntary nature of treatment; and the clinic's cancellation policy. In addition to written consent, verbal informed consent for psychological services was obtained from [name] prior to the initial intake interview.   [name] was informed that information about mental health appointments will be entered in the medical record at [facility]) via Epic. Moreover, [name] agreed information may be shared with other [facility] providers as needed for coordination of care. Written consent was also provided for this provider to coordinate care with other providers at [facility].The provider further explained leaving voicemail messages and sending messages via MyChart can be utilized for non-emergency reasons, and limits of confidentiality related to communication via technology was discussed. Furthermore, [name] was informed the clinic is not a 24/7 crisis center and mental health emergency resources were shared. [name] was given a handout with emergency resources. [name] verbally acknowledged understanding, and agreed to use mental health emergency resources discussed if needed.   Chief Complaint: [name] was referred by Dr. Dennard Nip. Per the note for the initial visit with [name] on November 20, 2017,  [name] was asked to complete a questionnaire assessing various behaviors related to emotional eating. [name] endorsed the following: *** Overeat when you are celebrating Experience food cravings on a regular basis Eat certain foods when you  are anxious, stressed, depressed, or your feelings are hurt Use food to help you cope with emotional situations Find food is comforting to you Overeat when you are angry or upset Overeat when you are worried about something Overeat frequently when you are bored or lonely Not worry about what you eat when you are in a good mood Overeat when you are angry at someone just to show them they cannot control you Overeat when you are alone, but eat much less when you are with other people Eat to help you stay awake Eat as a reward  HPI: Per the note for the initial visit with [name] on November 20, 2017,  Mental Status Examination: [name] arrived on time for the appointment. She presented as appropriately dressed and groomed. [name] appeared her stated age and demonstrated adequate orientation to time, place, person, and purpose of the appointment. She also demonstrated appropriate eye contact. No psychomotor abnormalities or behavioral peculiarities noted. Her mood was *** with congruent affect. Her thought processes were logical, linear, and goal-directed. No hallucinations, delusions, bizarre thinking or behavior reported or observed. Judgment, insight, and impulse control appeared to be grossly intact. There was no evidence of paraphasias (i.e., errors in speech, gross mispronunciations, and word substitutions), repetition deficits, or disturbances in volume or prosody (i.e., rhythm and intonation). There was no evidence of attention or memory impairments. [name] denied current suicidal and homicidal ideation, plan, and intent.   The Montreal Cognitive Assessment (MoCA) was administered. The MoCA assesses different cognitive domains: attention and concentration, executive functions, memory, language, visuoconstructional skills, conceptual thinking, calculations, and orientation. [name] received *** out of 30 points possible on the MoCA, which is noted in the *** range. ***[A point was added to the total  score due to years of formal education being 12 years or fewer.] The following  points were lost: ***  Family & Psychosocial History: ***  Medical History: ***  Mental Health History: ***  Structured Assessment Results: The Patient Health Questionnaire-9 (PHQ-9) is a self-report measure that assesses symptoms and severity of depression over the course of the last two weeks. [name] obtained a score of *** suggesting *** depression. [name] finds the endorsed symptoms to be ***.    The Generalized Anxiety Disorder-7 (GAD-7) is a brief self-report measure that assesses symptoms of anxiety over the course of the last two weeks. [name] obtained a score of *** suggesting *** anxiety.    Interventions: A chart review was conducted prior to the clinical intake interview. The MoCA, PHQ-9, and GAD-7 were administered and a clinical intake interview was completed. In addition, [name] was asked to complete a Mood and Food questionnaire to assess various behaviors related to emotional eating. Throughout session, empathic reflections and validation was provided. Continuing treatment with this provider was discussed and treatment goals were established.  Provisional DSM-5 Diagnosis:***  Plan: [name] expressed understanding and agreement with the initial treatment plan of care. She appears able and willing to participate as evidenced by collaboration on treatment goals, engagement in reciprocal conversation, and asking questions as needed for clarification. The next appointment will be scheduled in *** week(s). The following treatment goals were established: ***.

## 2017-11-21 NOTE — Progress Notes (Signed)
.  Office: [phone]  /  Fax: [phone]   HPI:   Chief Complaint: OBESITY  [name] (MR# [id number]) is a 57 y.o. female who presents on 11/20/2017 for obesity evaluation and treatment. Current BMI is Body mass index is 47.03 kg/m.[name] [name] has struggled with obesity for years and has been unsuccessful in either losing weight or maintaining long term weight loss. [name] attended our information session and states she is currently in the action stage of change and ready to dedicate time achieving and maintaining a healthier weight.   [name] states her family eats meals together she struggles with family and or coworkers weight loss sabotage her desired weight loss is 65 lbs she started gaining weight after her last child 31 years ago her heaviest weight ever was 240 lbs she has significant food cravings issues  she skips meals frequently she is frequently drinking liquids with calories she frequently makes poor food choices she has problems with excessive hunger  she frequently eats larger portions than normal  she struggles with emotional eating    Fatigue [name] feels her energy is lower than it should be. This has worsened with weight gain and has not worsened recently. [name] admits to daytime somnolence and  admits to waking up still tired. Patient is at risk for obstructive sleep apnea. Patent has a history of symptoms of daytime fatigue and morning headaches. Patient generally gets 6 hours of sleep per night, and states they generally have generally restful sleep. Snoring is present. Apneic episodes are present. Epworth Sleepiness Score is 12.  Dyspnea on exertion [name] notes increasing shortness of breath with exercising and seems to be worsening over time with weight gain. She notes getting out of breath sooner with activity than she used to. This has not gotten worse recently. Khya denies orthopnea.  Diabetes II [name] has a diagnosis of diabetes type II.  [name] states she is not checking BGs at home. She notes polyphagia and polyuria but she has congestive heart failure on diuretics. She denies hypoglycemia. She has been working on intensive lifestyle modifications including diet, exercise, and weight loss to help control her blood glucose levels.  Vitamin D Deficiency [name] has a diagnosis of vitamin D deficiency. She is not on Vit D, no recent labs. She notes fatigue and denies nausea, vomiting or muscle weakness.  Congestive Heart Failure [name] is a 57 y.o. female with congestive heart failure. EKG is within normal limits. She is on angiotesin converting enzymes, beta blockers, and Lasix. She has tried to decrease Na+ but gets resistance from family. She is still eating >2,000 mg of Na+ daily.  Depression with emotional eating behaviors [name] is on Zoloft, she notes significant emotional eating and decreased self-esteem, also significant sabotage from family with no real support system. [name] struggles with emotional eating and using food for comfort to the extent that it is negatively impacting her health. She often snacks when she is not hungry. [name] sometimes feels she is out of control and then feels guilty that she made poor food choices. She has been working on behavior modification techniques to help reduce her emotional eating and has been somewhat successful. She shows no sign of suicidal or homicidal ideations.  Depression screen [facility] 2/9 11/20/2017 09/10/2013  Decreased Interest 1 1  Down, Depressed, Hopeless 1 1  PHQ - 2 Score 2 2  Altered sleeping 1 -  Tired, decreased energy 2 -  Change in appetite 3 -  Feeling bad  or failure about yourself  1 -  Trouble concentrating 1 -  Moving slowly or fidgety/restless 1 -  Suicidal thoughts 0 -  PHQ-9 Score 11 -  Difficult doing work/chores Somewhat difficult -  Some recent data might be hidden    Hyperlipidemia [name] has hyperlipidemia. She is on Lipitor and denies  any chest pain, claudication or myalgias. She would like to work on diet and improve her cholesterol levels with intensive lifestyle modification including a low saturated fat diet, exercise and weight loss.    Depression Screen [name]'s Food and Mood (modified PHQ-9) score was  Depression screen PHQ 2/9 11/20/2017  Decreased Interest 1  Down, Depressed, Hopeless 1  PHQ - 2 Score 2  Altered sleeping 1  Tired, decreased energy 2  Change in appetite 3  Feeling bad or failure about yourself  1  Trouble concentrating 1  Moving slowly or fidgety/restless 1  Suicidal thoughts 0  PHQ-9 Score 11  Difficult doing work/chores Somewhat difficult  Some recent data might be hidden    ALLERGIES: Allergies  Allergen Reactions  . Penicillins Swelling    Has patient had a PCN reaction causing immediate rash, facial/tongue/throat swelling, SOB or lightheadedness with hypotension: YES Has patient had a PCN reaction causing severe rash involving mucus membranes or skin necrosis: NO Has patient had a PCN reaction that required hospitalization NO Has patient had a PCN reaction occurring within the last 10 years: NO If all of the above answers are "NO", then may proceed with Cephalosporin use.  . Latex Rash  . Sulfa Antibiotics Diarrhea, Itching and Rash    MEDICATIONS: Current Outpatient Medications on File Prior to Visit  Medication Sig Dispense Refill  . amLODipine (NORVASC) 5 MG tablet Take 5 mg by mouth daily.  6  . aspirin EC 81 MG tablet Take 81 mg by mouth daily.    [name] atorvastatin (LIPITOR) 40 MG tablet Take 40 mg by mouth at bedtime.     . carvedilol (COREG CR) 10 MG 24 hr capsule Take 10 mg by mouth daily.    . cyclobenzaprine (FLEXERIL) 5 MG tablet Take 5 mg by mouth 3 (three) times daily.     . furosemide (LASIX) 40 MG tablet Take 40 mg by mouth daily.    [name] gabapentin (NEURONTIN) 300 MG capsule Take 2 capsules (600 mg total) by mouth 3 (three) times daily. 180 capsule 2  . glipiZIDE  (GLUCOTROL) 5 MG tablet Take 5 mg by mouth 2 (two) times daily.  1  . HYDROcodone-acetaminophen (NORCO/VICODIN) 5-325 MG tablet Take 1 tablet by mouth every 4 (four) hours as needed. 15 tablet 0  . isosorbide dinitrate (ISORDIL) 20 MG tablet Take 20 mg by mouth 2 (two) times daily.     [name] KLOR-CON M10 10 MEQ tablet Take 10 mEq by mouth 2 (two) times daily.     [name] KOMBIGLYZE XR 5-500 MG TB24 Take 1 tablet by mouth every morning.     [name] lisinopril (PRINIVIL,ZESTRIL) 20 MG tablet Take 20 mg by mouth daily.     [name] loperamide (IMODIUM A-D) 2 MG tablet Take 2 mg by mouth 4 (four) times daily as needed for diarrhea or loose stools.    [name] MYRBETRIQ 25 MG TB24 tablet Take 25 mg by mouth daily.  5  . omeprazole (PRILOSEC) 20 MG capsule Take 20 mg by mouth 2 (two) times daily.    . sertraline (ZOLOFT) 100 MG tablet Take 100 mg by mouth daily.  No current facility-administered medications on file prior to visit.     PAST MEDICAL HISTORY: Past Medical History:  Diagnosis Date  . Anginal pain ([place])    a. NL cath in 2008;  b. Myoview 03/2011: dec uptake along mid anterior wall on stress imaging -> ? attenuation vs. ischemia, EF 65%;  c. Echo 04/2011: EF 55-60%, no RWMA, Gr 2 dd  . Anxiety   . Arthritis   . Asthma   . Back pain   . Bone cancer ([place])   . Cancer ([place])   . Chest pain   . CHF (congestive heart failure) ([place])   . CHF (congestive heart failure) ([place])   . Depression   . Diabetes mellitus   . Drug use   . Dyspnea    with exertion  . Dyspnea   . Frequent urination   . GERD (gastroesophageal reflux disease)   . Glaucoma   . Headache   . HLD (hyperlipidemia)   . Hypertension   . IBS (irritable bowel syndrome)   . Joint pain   . Lactose intolerance   . Leg edema   . 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 Obesity   . Palpitations   . Pneumonia 05/2016   double  . Pulmonary edema   . Pulmonary embolism ([place])    a. 2008 -> coumadin x 6 mos.  . Rheumatoid  arthritis ([place])   . Sleep apnea    does not use CPAP  . TIA (transient ischemic attack)   . Urinary urgency     PAST SURGICAL HISTORY: Past Surgical History:  Procedure Laterality Date  . ABDOMINAL HYSTERECTOMY  2005  . CARDIAC CATHETERIZATION     Normal  . CARDIAC CATHETERIZATION N/A 09/30/2014   Procedure: Left Heart Cath and Coronary Angiography;  Surgeon: Sherren Mocha, MD;  Location: [place] [facility];  Service: Cardiovascular;  Laterality: N/A;  . LAPAROSCOPIC APPENDECTOMY N/A 06/03/2012   Procedure: APPENDECTOMY LAPAROSCOPIC;  Surgeon: Stark Klein, MD;  Location: [facility] [place];  Service: General;  Laterality: N/A;  . Left knee surgery  2008  . LEG SURGERY    . TONSILLECTOMY    . TOTAL KNEE ARTHROPLASTY Left 08/22/2016   Procedure: TOTAL KNEE ARTHROPLASTY;  Surgeon: Meredith Pel, MD;  Location: [place];  Service: Orthopedics;  Laterality: Left;  . TUBAL LIGATION  1989    SOCIAL HISTORY: Social History   Tobacco Use  . Smoking status: Former Smoker    Packs/day: 0.50    Years: 15.00    Pack years: 7.50    Types: Cigarettes    Last attempt to quit: 04/24/1985    Years since quitting: 32.6  . Smokeless tobacco: Never Used  Substance Use Topics  . Alcohol use: No    Alcohol/week: 0.0 oz  . Drug use: Yes    Types: Marijuana    FAMILY HISTORY: Family History  Problem Relation Age of Onset  . Emphysema Mother   . Arthritis Mother   . Heart failure Mother        alive @ 40  . Stroke Mother   . Diabetes Mother   . Hypertension Mother   . Hyperlipidemia Mother   . Depression Mother   . Anxiety disorder Mother   . Asthma Brother   . Heart disease Father        died @ 54's.  . Stroke Father   . Diabetes Father   . Hyperlipidemia Father   .  Hypertension Father   . Bipolar disorder Father   . Sleep apnea Father   . Alcoholism Father   . Drug abuse Father   . Diabetes Sister   . Heart disease Paternal Grandfather   . Colon cancer Maternal Grandfather   . Breast  cancer Maternal Aunt     ROS: Review of Systems  Constitutional: Positive for malaise/fatigue. Negative for weight loss.  HENT: Positive for tinnitus.        + Decreased hearing + Dentures  + Hoarseness  Eyes:       + Wear glasses or contacts + Floaters  Respiratory: Positive for shortness of breath (with exertion) and wheezing.   Cardiovascular: Positive for palpitations. Negative for chest pain, orthopnea and claudication.       + Leg cramping  Gastrointestinal: Negative for nausea and vomiting.  Genitourinary: Positive for frequency.  Musculoskeletal: Positive for back pain. Negative for myalgias.       Negative muscle weakness + Neck stiffness + Muscle or joint pain + Muscle stiffness  Skin:       + Dryness  Neurological: Positive for weakness and headaches.  Endo/Heme/Allergies: Bruises/bleeds easily.       Positive polyphagia Negative hypoglycemia  Psychiatric/Behavioral: Positive for depression. Negative for suicidal ideas.       + Stress    PHYSICAL EXAM: Blood pressure 137/81, pulse 80, temperature 97.6 F (36.4 C), temperature source Oral, height 4\' 10"  (1.473 m), weight 225 lb (102.1 kg), SpO2 97 %. Body mass index is 47.03 kg/m. Physical Exam  Constitutional: She is oriented to person, place, and time. She appears well-developed and well-nourished.  HENT:  Head: Normocephalic and atraumatic.  Nose: Nose normal.  Eyes: EOM are normal. No scleral icterus.  Neck: Normal range of motion. Neck supple. No thyromegaly present.  Cardiovascular: Normal rate and regular rhythm.  Murmur (1/6 early systolic murmur) heard. Pulmonary/Chest: Effort normal. No respiratory distress.  Abdominal: Soft. There is no tenderness.  + Obesity  Musculoskeletal:  Range of Motion normal in all 4 extremities 1+ edema noted in left bilateral lower extremity and trace edema noted in right bilateral lower extremity   Neurological: She is alert and oriented to person, place, and time.  Coordination normal.  Skin: Skin is warm and dry.  Psychiatric: She has a normal mood and affect. Her behavior is normal.  Vitals reviewed.   RECENT LABS AND TESTS: BMET    Component Value Date/Time   NA 141 11/20/2017 0925   K 3.5 11/20/2017 0925   CL 98 11/20/2017 0925   CO2 26 11/20/2017 0925   GLUCOSE 158 (H) 11/20/2017 0925   GLUCOSE 152 (H) 07/27/2017 2248   BUN 9 11/20/2017 0925   CREATININE 0.86 11/20/2017 0925   CALCIUM 9.9 11/20/2017 0925   GFRNONAA 75 11/20/2017 0925   GFRAA 86 11/20/2017 0925   Lab Results  Component Value Date   HGBA1C 8.8 (H) 11/20/2017   Lab Results  Component Value Date   INSULIN 53.9 (H) 11/20/2017   CBC    Component Value Date/Time   WBC 6.5 11/20/2017 0925   WBC 9.5 07/27/2017 2248   RBC 4.78 11/20/2017 0925   RBC 4.87 07/27/2017 2248   HGB 12.8 11/20/2017 0925   HCT 40.6 11/20/2017 0925   PLT 200 07/27/2017 2248   MCV 85 11/20/2017 0925   MCH 26.8 11/20/2017 0925   MCH 28.1 07/27/2017 2248   MCHC 31.5 11/20/2017 0925   MCHC 33.6 07/27/2017 2248   RDW  13.9 11/20/2017 0925   LYMPHSABS 2.1 11/20/2017 0925   MONOABS 0.2 05/21/2016 0800   EOSABS 0.4 11/20/2017 0925   BASOSABS 0.0 11/20/2017 0925   Iron/TIBC/Ferritin/ %Sat No results found for: IRON, TIBC, FERRITIN, IRONPCTSAT Lipid Panel     Component Value Date/Time   CHOL 158 11/20/2017 0925   TRIG 228 (H) 11/20/2017 0925   HDL 53 11/20/2017 0925   CHOLHDL 4.2 04/23/2011 0830   CHOLHDL 4.3 04/23/2011 0830   VLDL 29 04/23/2011 0830   VLDL 29 04/23/2011 0830   LDLCALC 59 11/20/2017 0925   Hepatic Function Panel     Component Value Date/Time   PROT 7.0 11/20/2017 0925   ALBUMIN 4.3 11/20/2017 0925   AST 33 11/20/2017 0925   ALT 53 (H) 11/20/2017 0925   ALKPHOS 87 11/20/2017 0925   BILITOT 0.4 11/20/2017 0925   BILIDIR 0.1 05/22/2016 0913   IBILI 0.6 05/22/2016 0913      Component Value Date/Time   TSH 0.638 11/20/2017 0925   Vitamin D No recent  labs  ECG  shows NSR with a rate of 78 BPM INDIRECT CALORIMETER done today shows a VO2 of 245 and a REE of 1705. Her calculated basal metabolic rate is 9485 thus her basal metabolic rate is better than expected.    ASSESSMENT AND PLAN: Other fatigue - Plan: EKG 12-Lead, CBC With Differential, T3, T4, free, TSH  Shortness of breath on exertion - Plan: CBC With Differential  Type 2 diabetes mellitus without complication, without long-term current use of insulin (HCC) - Plan: Comprehensive metabolic panel, Hemoglobin A1c, Insulin, random  Vitamin D deficiency - Plan: VITAMIN D 25 Hydroxy (Vit-D Deficiency, Fractures)  Chronic diastolic congestive heart failure (HCC) - Plan: Lipid Panel With LDL/HDL Ratio  Other hyperlipidemia  Other depression - with emotional eating  Class 3 severe obesity with serious comorbidity and body mass index (BMI) of 45.0 to 49.9 in adult, unspecified obesity type (HCC)  PLAN:  Fatigue [name] was informed that her fatigue may be related to obesity, depression or many other causes. Labs will be ordered, and in the meanwhile Falan has agreed to work on diet, exercise and weight loss to help with fatigue. Proper sleep hygiene was discussed including the need for 7-8 hours of quality sleep each night. A sleep study was not ordered based on symptoms and Epworth score.  Dyspnea on exertion [name]'s shortness of breath appears to be obesity related and exercise induced. She has agreed to work on weight loss and gradually increase exercise to treat her exercise induced shortness of breath. If Charlea follows our instructions and loses weight without improvement of her shortness of breath, we will plan to refer to pulmonology. We will monitor this condition regularly. Arrin agrees to this plan.  Diabetes II [name] has been given extensive diabetes education by myself today including ideal fasting and post-prandial blood glucose readings, individual ideal Hgb A1c  goals and hypoglycemia prevention. We discussed the importance of good blood sugar control to decrease the likelihood of diabetic complications such as nephropathy, neuropathy, limb loss, blindness, coronary artery disease, and death. We discussed the importance of intensive lifestyle modification including diet, exercise and weight loss as the first line treatment for diabetes. Aaniyah agrees to continue her medications, she is to check BGs BID and start diet. We will check labs and Mayrin agrees to follow up with our clinic in 2 weeks with myself and Dr. [name] Mussel, our bariatric psychologist.  Vitamin D Deficiency [name] was informed that  low vitamin D levels contributes to fatigue and are associated with obesity, breast, and colon cancer. She will follow up for routine testing of vitamin D, at least 2-3 times per year. She was informed of the risk of over-replacement of vitamin D and agrees to not increase her dose unless she discusses this with [name] first. We will check labs and [name] agrees to follow up with our clinic in 2 weeks with myself and Dr. [name] Mussel, our bariatric psychologist.  Congestive Heart Failure We discussed sodium restriction, and she will continue to work on diet, exercise, and will work on decreasing Na+. [name] agrees to follow up with our clinic in 2 weeks with myself and Dr. [name] Mussel, our bariatric psychologist.  Depression with Emotional Eating Behaviors We discussed behavior modification techniques today to help [name] deal with her emotional eating and depression. [name] agrees to continue her medications and she agrees to follow up with our clinic in 2 weeks with myself and Dr. [name] Mussel, our bariatric psychologist.  Hyperlipidemia [name] was informed of the American Heart Association Guidelines emphasizing intensive lifestyle modifications as the first line treatment for hyperlipidemia. We discussed many lifestyle modifications today in depth, and [name] will continue to work on  decreasing saturated fats such as fatty red meat, butter and many fried foods. She will continue Lipitor and start diet, and will also increase vegetables and lean protein in her diet and continue to work on exercise and weight loss efforts. We will check labs and Feleica agrees to follow up with our clinic in 2 weeks with myself and Dr. [name] Mussel, our bariatric psychologist.  Depression Screen [name] had a moderately positive depression screening. Depression is commonly associated with obesity and often results in emotional eating behaviors. We will monitor this closely and work on CBT to help improve the non-hunger eating patterns. Referral to Psychology may be required if no improvement is seen as she continues in our clinic.  Obesity [name] is currently in the action stage of change and her goal is to continue with weight loss efforts She has agreed to follow the Category 2 plan Gabbriella has been instructed to work up to a goal of 150 minutes of combined cardio and strengthening exercise per week for weight loss and overall health benefits. We discussed the following Behavioral Modification Stratagies today: increasing lean protein intake, decreasing sodium intake, work on meal planning and easy cooking plans, and increase H20 intake  Mescal has agreed to follow up with our clinic in 2 weeks with myself and Dr. [name] Mussel, our bariatric psychologist. She was informed of the importance of frequent follow up visits to maximize her success with intensive lifestyle modifications for her multiple health conditions. She was informed we would discuss her lab results at her next visit unless there is a critical issue that needs to be addressed sooner. Cheyanne agreed to keep her next visit at the agreed upon time to discuss these results.    OBESITY BEHAVIORAL INTERVENTION VISIT  Today's visit was # 1 out of 22.  Starting weight: 225 lbs Starting date: 11/20/17 Today's weight : 225 lbs  Today's date:  11/20/2017 Total lbs lost to date: 0    ASK: We discussed the diagnosis of obesity with Edwyna Ready today and Tatyanna agreed to give [name] permission to discuss obesity behavioral modification therapy today.  ASSESS: Triana has the diagnosis of obesity and her BMI today is 47.04 Jimia is in the action stage of change   ADVISE: Ivis was educated on the  multiple health risks of obesity as well as the benefit of weight loss to improve her health. She was advised of the need for long term treatment and the importance of lifestyle modifications.  AGREE: Multiple dietary modification options and treatment options were discussed and  Mathilda agreed to the above obesity treatment plan.   I, Trixie Dredge, am acting as transcriptionist for Dennard Nip, MD   I have reviewed the above documentation for accuracy and completeness, and I agree with the above. -Dennard Nip, MD

## 2017-12-04 ENCOUNTER — Ambulatory Visit (INDEPENDENT_AMBULATORY_CARE_PROVIDER_SITE_OTHER): Payer: Self-pay | Admitting: Psychology

## 2017-12-04 ENCOUNTER — Ambulatory Visit (INDEPENDENT_AMBULATORY_CARE_PROVIDER_SITE_OTHER): Payer: Medicare Other | Admitting: Family Medicine

## 2017-12-04 VITALS — BP 132/83 | HR 77 | Temp 97.9°F | Ht <= 58 in | Wt 221.0 lb

## 2017-12-04 DIAGNOSIS — E559 Vitamin D deficiency, unspecified: Secondary | ICD-10-CM | POA: Diagnosis not present

## 2017-12-04 DIAGNOSIS — Z6841 Body Mass Index (BMI) 40.0 and over, adult: Secondary | ICD-10-CM

## 2017-12-04 DIAGNOSIS — E119 Type 2 diabetes mellitus without complications: Secondary | ICD-10-CM

## 2017-12-04 MED ORDER — VITAMIN D (ERGOCALCIFEROL) 1.25 MG (50000 UNIT) PO CAPS: 50000.0000 [IU] | ORAL_CAPSULE | ORAL | 0 refills | Status: DC

## 2017-12-04 NOTE — Progress Notes (Signed)
Office: [phone]  /  Fax: [phone]   HPI:   Chief Complaint: OBESITY [name] is here to discuss her progress with her obesity treatment plan. She is on the Category 2 plan and is following her eating plan approximately 80 % of the time. She states she is walking the dog 15 to 20 minutes 5 times per week. [name] did well with weight loss on the category 2 plan. She worked on meal planning, but she sometimes found eating all her protein difficult. Hunger was mostly controlled. Her weight is 221 lb (100.2 kg) today and has had a weight loss of 4 pounds over a period of 2 weeks since her last visit. She has lost 4 lbs since starting treatment with [name].  Diabetes II [name] has a diagnosis of diabetes type II. [name] states fasting BGs range between 64 and 139 and 2 hour post prandial BGs range between 101 and 196, but they have been improved with diet in the last two weeks. She did have 1 episode of hypoglycemia in the morning. Last A1c was at 8.8 She has been working on intensive lifestyle modifications including diet, exercise, and weight loss to help control her blood glucose levels.  Vitamin D deficiency (new) [name] has a new diagnosis of vitamin D deficiency. [name] is not currently taking vit D and she admits fatigue, but denies nausea, vomiting or muscle weakness.  ALLERGIES: Allergies  Allergen Reactions  . Penicillins Swelling    Has patient had a PCN reaction causing immediate rash, facial/tongue/throat swelling, SOB or lightheadedness with hypotension: YES Has patient had a PCN reaction causing severe rash involving mucus membranes or skin necrosis: NO Has patient had a PCN reaction that required hospitalization NO Has patient had a PCN reaction occurring within the last 10 years: NO If all of the above answers are "NO", then may proceed with Cephalosporin use.  . Latex Rash  . Sulfa Antibiotics Diarrhea, Itching and Rash    MEDICATIONS: Current Outpatient Medications  on File Prior to Visit  Medication Sig Dispense Refill  . amLODipine (NORVASC) 5 MG tablet Take 5 mg by mouth daily.  6  . aspirin EC 81 MG tablet Take 81 mg by mouth daily.    [name] atorvastatin (LIPITOR) 40 MG tablet Take 40 mg by mouth at bedtime.     . carvedilol (COREG CR) 10 MG 24 hr capsule Take 10 mg by mouth daily.    . cyclobenzaprine (FLEXERIL) 5 MG tablet Take 5 mg by mouth 3 (three) times daily.     . furosemide (LASIX) 40 MG tablet Take 40 mg by mouth daily.    [name] gabapentin (NEURONTIN) 300 MG capsule Take 2 capsules (600 mg total) by mouth 3 (three) times daily. 180 capsule 2  . glipiZIDE (GLUCOTROL) 5 MG tablet Take 5 mg by mouth 2 (two) times daily.  1  . HYDROcodone-acetaminophen (NORCO/VICODIN) 5-325 MG tablet Take 1 tablet by mouth every 4 (four) hours as needed. 15 tablet 0  . isosorbide dinitrate (ISORDIL) 20 MG tablet Take 20 mg by mouth 2 (two) times daily.     [name] KLOR-CON M10 10 MEQ tablet Take 10 mEq by mouth 2 (two) times daily.     [name] KOMBIGLYZE XR 5-500 MG TB24 Take 1 tablet by mouth every morning.     [name] lisinopril (PRINIVIL,ZESTRIL) 20 MG tablet Take 20 mg by mouth daily.     [name] loperamide (IMODIUM A-D) 2 MG tablet Take 2 mg by mouth 4 (four) times  daily as needed for diarrhea or loose stools.    [name] MYRBETRIQ 25 MG TB24 tablet Take 25 mg by mouth daily.  5  . omeprazole (PRILOSEC) 20 MG capsule Take 20 mg by mouth 2 (two) times daily.    . sertraline (ZOLOFT) 100 MG tablet Take 100 mg by mouth daily.      No current facility-administered medications on file prior to visit.     PAST MEDICAL HISTORY: Past Medical History:  Diagnosis Date  . Anginal pain ([place])    a. NL cath in 2008;  b. Myoview 03/2011: dec uptake along mid anterior wall on stress imaging -> ? attenuation vs. ischemia, EF 65%;  c. Echo 04/2011: EF 55-60%, no RWMA, Gr 2 dd  . Anxiety   . Arthritis   . Asthma   . Back pain   . Bone cancer ([place])   . Cancer ([place])   . Chest pain   . CHF (congestive  heart failure) ([place])   . CHF (congestive heart failure) ([place])   . Depression   . Diabetes mellitus   . Drug use   . Dyspnea    with exertion  . Dyspnea   . Frequent urination   . GERD (gastroesophageal reflux disease)   . Glaucoma   . Headache   . HLD (hyperlipidemia)   . Hypertension   . IBS (irritable bowel syndrome)   . Joint pain   . Lactose intolerance   . Leg edema   . 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 Obesity   . Palpitations   . Pneumonia 05/2016   double  . Pulmonary edema   . Pulmonary embolism ([place])    a. 2008 -> coumadin x 6 mos.  . Rheumatoid arthritis ([place])   . Sleep apnea    does not use CPAP  . TIA (transient ischemic attack)   . Urinary urgency     PAST SURGICAL HISTORY: Past Surgical History:  Procedure Laterality Date  . ABDOMINAL HYSTERECTOMY  2005  . CARDIAC CATHETERIZATION     Normal  . CARDIAC CATHETERIZATION N/A 09/30/2014   Procedure: Left Heart Cath and Coronary Angiography;  Surgeon: Sherren Mocha, MD;  Location: [facility];  Service: Cardiovascular;  Laterality: N/A;  . LAPAROSCOPIC APPENDECTOMY N/A 06/03/2012   Procedure: APPENDECTOMY LAPAROSCOPIC;  Surgeon: Stark Klein, MD;  Location: [facility];  Service: General;  Laterality: N/A;  . Left knee surgery  2008  . LEG SURGERY    . TONSILLECTOMY    . TOTAL KNEE ARTHROPLASTY Left 08/22/2016   Procedure: TOTAL KNEE ARTHROPLASTY;  Surgeon: Meredith Pel, MD;  Location: [facility];  Service: Orthopedics;  Laterality: Left;  . TUBAL LIGATION  1989    SOCIAL HISTORY: Social History   Tobacco Use  . Smoking status: Former Smoker    Packs/day: 0.50    Years: 15.00    Pack years: 7.50    Types: Cigarettes    Last attempt to quit: 04/24/1985    Years since quitting: 32.6  . Smokeless tobacco: Never Used  Substance Use Topics  . Alcohol use: No    Alcohol/week: 0.0 standard drinks  . Drug use: Yes    Types: Marijuana    FAMILY HISTORY: Family History    Problem Relation Age of Onset  . Emphysema Mother   . Arthritis Mother   . Heart failure Mother        alive @ 82  . Stroke Mother   .  Diabetes Mother   . Hypertension Mother   . Hyperlipidemia Mother   . Depression Mother   . Anxiety disorder Mother   . Asthma Brother   . Heart disease Father        died @ 47's.  . Stroke Father   . Diabetes Father   . Hyperlipidemia Father   . Hypertension Father   . Bipolar disorder Father   . Sleep apnea Father   . Alcoholism Father   . Drug abuse Father   . Diabetes Sister   . Heart disease Paternal Grandfather   . Colon cancer Maternal Grandfather   . Breast cancer Maternal Aunt     ROS: Review of Systems  Constitutional: Positive for malaise/fatigue and weight loss.  Gastrointestinal: Negative for nausea and vomiting.  Musculoskeletal:       Negative for muscle weakness  Endo/Heme/Allergies:       Positive for polyphagia Positive for hypoglycemia    PHYSICAL EXAM: Blood pressure 132/83, pulse 77, temperature 97.9 F (36.6 C), temperature source Oral, height 4\' 10"  (1.473 m), weight 221 lb (100.2 kg), SpO2 97 %. Body mass index is 46.19 kg/m. Physical Exam  Constitutional: She is oriented to person, place, and time. She appears well-developed and well-nourished.  Cardiovascular: Normal rate.  Pulmonary/Chest: Effort normal.  Musculoskeletal: Normal range of motion.  Neurological: She is oriented to person, place, and time.  Skin: Skin is warm and dry.  Psychiatric: She has a normal mood and affect. Her behavior is normal.  Vitals reviewed.   RECENT LABS AND TESTS: BMET    Component Value Date/Time   NA 141 11/20/2017 0925   K 3.5 11/20/2017 0925   CL 98 11/20/2017 0925   CO2 26 11/20/2017 0925   GLUCOSE 158 (H) 11/20/2017 0925   GLUCOSE 152 (H) 07/27/2017 2248   BUN 9 11/20/2017 0925   CREATININE 0.86 11/20/2017 0925   CALCIUM 9.9 11/20/2017 0925   GFRNONAA 75 11/20/2017 0925   GFRAA 86 11/20/2017 0925    Lab Results  Component Value Date   HGBA1C 8.8 (H) 11/20/2017   HGBA1C 7.4 (H) 08/11/2016   HGBA1C 6.6 (H) 05/21/2016   HGBA1C 6.2 (H) 10/28/2012   HGBA1C 6.8 (H) 01/10/2012   Lab Results  Component Value Date   INSULIN 53.9 (H) 11/20/2017   CBC    Component Value Date/Time   WBC 6.5 11/20/2017 0925   WBC 9.5 07/27/2017 2248   RBC 4.78 11/20/2017 0925   RBC 4.87 07/27/2017 2248   HGB 12.8 11/20/2017 0925   HCT 40.6 11/20/2017 0925   PLT 200 07/27/2017 2248   MCV 85 11/20/2017 0925   MCH 26.8 11/20/2017 0925   MCH 28.1 07/27/2017 2248   MCHC 31.5 11/20/2017 0925   MCHC 33.6 07/27/2017 2248   RDW 13.9 11/20/2017 0925   LYMPHSABS 2.1 11/20/2017 0925   MONOABS 0.2 05/21/2016 0800   EOSABS 0.4 11/20/2017 0925   BASOSABS 0.0 11/20/2017 0925   Iron/TIBC/Ferritin/ %Sat No results found for: IRON, TIBC, FERRITIN, IRONPCTSAT Lipid Panel     Component Value Date/Time   CHOL 158 11/20/2017 0925   TRIG 228 (H) 11/20/2017 0925   HDL 53 11/20/2017 0925   CHOLHDL 4.2 04/23/2011 0830   CHOLHDL 4.3 04/23/2011 0830   VLDL 29 04/23/2011 0830   VLDL 29 04/23/2011 0830   LDLCALC 59 11/20/2017 0925   Hepatic Function Panel     Component Value Date/Time   PROT 7.0 11/20/2017 0925   ALBUMIN 4.3 11/20/2017  0925   AST 33 11/20/2017 0925   ALT 53 (H) 11/20/2017 0925   ALKPHOS 87 11/20/2017 0925   BILITOT 0.4 11/20/2017 0925   BILIDIR 0.1 05/22/2016 0913   IBILI 0.6 05/22/2016 0913      Component Value Date/Time   TSH 0.638 11/20/2017 0925   TSH 0.112 (L) 05/24/2016 0629   TSH 0.345 (L) 06/18/2014 0930   TSH 0.167 (L) 10/28/2012 0144   Results for ELENORE, WANNINGER (MRN [id number]) as of 12/04/2017 17:27  Ref. Range 11/20/2017 09:25  Vitamin D, 25-Hydroxy Latest Ref Range: 30.0 - 100.0 ng/mL 14.7 (L)   ASSESSMENT AND PLAN: Type 2 diabetes mellitus without complication, without long-term current use of insulin (HCC)  Vitamin D deficiency - Plan: Vitamin D,  Ergocalciferol, (DRISDOL) 50000 units CAPS capsule  Class 3 severe obesity with serious comorbidity and body mass index (BMI) of 45.0 to 49.9 in adult, unspecified obesity type ([place])  PLAN:  Diabetes II Yaneth has been given extensive diabetes education by myself today including ideal fasting and post-prandial blood glucose readings, individual ideal Hgb A1c goals and hypoglycemia prevention. We discussed the importance of good blood sugar control to decrease the likelihood of diabetic complications such as nephropathy, neuropathy, limb loss, blindness, coronary artery disease, and death. We discussed the importance of intensive lifestyle modification including diet, exercise and weight loss as the first line treatment for diabetes. Rashi agrees to continue her diabetes medications, diet and exercise. The goal is to discontinue glipizide as soon as possible. Febe agrees to follow up at the agreed upon time.  Vitamin D Deficiency Hadiya was informed that low vitamin D levels contributes to fatigue and are associated with obesity, breast, and colon cancer. She agrees to start prescription Vit D @50 ,000 IU every week #4 with no refills and will follow up for routine testing of vitamin D, at least 2-3 times per year. She was informed of the risk of over-replacement of vitamin D and agrees to not increase her dose unless she discusses this with [name] first. Briley agrees to follow up as directed.  Obesity Zemira is currently in the action stage of change. As such, her goal is to continue with weight loss efforts She has agreed to follow the Category 2 plan Patrcia has been instructed to work up to a goal of 150 minutes of combined cardio and strengthening exercise per week for weight loss and overall health benefits. We discussed the following Behavioral Modification Strategies today: increasing lean protein intake, decrease eating out and work on meal planning and easy cooking plans  Marrianne has agreed  to follow up with our clinic in 2 weeks. She was informed of the importance of frequent follow up visits to maximize her success with intensive lifestyle modifications for her multiple health conditions.   OBESITY BEHAVIORAL INTERVENTION VISIT  Today's visit was # 2 out of 22.  Starting weight: 225 lbs Starting date: 11/20/17 Today's weight : 221 lbs Today's date: 12/04/2017 Total lbs lost to date: 4    ASK: We discussed the diagnosis of obesity with Edwyna Ready today and Tinsley agreed to give [name] permission to discuss obesity behavioral modification therapy today.  ASSESS: Lawan has the diagnosis of obesity and her BMI today is 64.2 Destyne is in the action stage of change   ADVISE: Amarilis was educated on the multiple health risks of obesity as well as the benefit of weight loss to improve her health. She was advised of the need for long term  treatment and the importance of lifestyle modifications.  AGREE: Multiple dietary modification options and treatment options were discussed and  Delenn agreed to the above obesity treatment plan.  I, Doreene Nest, am acting as transcriptionist for Dennard Nip, MD  I have reviewed the above documentation for accuracy and completeness, and I agree with the above. -Dennard Nip, MD

## 2017-12-20 ENCOUNTER — Ambulatory Visit (INDEPENDENT_AMBULATORY_CARE_PROVIDER_SITE_OTHER): Payer: Medicare Other | Admitting: Family Medicine

## 2017-12-20 VITALS — BP 112/71 | HR 75 | Ht <= 58 in | Wt 221.0 lb

## 2017-12-20 DIAGNOSIS — E559 Vitamin D deficiency, unspecified: Secondary | ICD-10-CM | POA: Diagnosis not present

## 2017-12-20 DIAGNOSIS — Z6841 Body Mass Index (BMI) 40.0 and over, adult: Secondary | ICD-10-CM

## 2017-12-20 DIAGNOSIS — E119 Type 2 diabetes mellitus without complications: Secondary | ICD-10-CM | POA: Diagnosis not present

## 2017-12-20 MED ORDER — VITAMIN D (ERGOCALCIFEROL) 1.25 MG (50000 UNIT) PO CAPS: 50000.0000 [IU] | ORAL_CAPSULE | ORAL | 0 refills | Status: DC

## 2017-12-25 NOTE — Progress Notes (Signed)
Office: [phone]  /  Fax: [phone]   HPI:   Chief Complaint: OBESITY [name] is here to discuss her progress with her obesity treatment plan. She is on the Category 2 plan and is following her eating plan approximately 78 % of the time. She states she is walking the dog and walking in the store 15 to 90 minutes 3 times per week. [name] has done well maintaining her weight, but she is deviating from her plan significantly, especially for lunch. She isn't always eating all her dinner and then she is snacking more at night. Part of her planning struggles are duet to money problems. Her weight is 221 lb (100.2 kg) today and has not lost weight since her last visit. She has lost 4 lbs since starting treatment with [name].  Diabetes II [name] has a diagnosis of diabetes type II. [name] notes low fasting BGs in the 80 to 90's range and 2 hour post prandial BGs in the 120's. Last A1c was at 8.8 She has been working on intensive lifestyle modifications including diet, exercise, and weight loss to help control her blood glucose levels.  Vitamin D deficiency [name] has a diagnosis of vitamin D deficiency. [name] is stable on vit D, but she is not yet at goal. [name] denies nausea, vomiting or muscle weakness.  ALLERGIES: Allergies  Allergen Reactions  . Penicillins Swelling    Has patient had a PCN reaction causing immediate rash, facial/tongue/throat swelling, SOB or lightheadedness with hypotension: YES Has patient had a PCN reaction causing severe rash involving mucus membranes or skin necrosis: NO Has patient had a PCN reaction that required hospitalization NO Has patient had a PCN reaction occurring within the last 10 years: NO If all of the above answers are "NO", then may proceed with Cephalosporin use.  . Latex Rash  . Sulfa Antibiotics Diarrhea, Itching and Rash    MEDICATIONS: Current Outpatient Medications on File Prior to Visit  Medication Sig Dispense Refill  . amLODipine  (NORVASC) 5 MG tablet Take 5 mg by mouth daily.  6  . aspirin EC 81 MG tablet Take 81 mg by mouth daily.    [name] atorvastatin (LIPITOR) 40 MG tablet Take 40 mg by mouth at bedtime.     . carvedilol (COREG CR) 10 MG 24 hr capsule Take 10 mg by mouth daily.    . cyclobenzaprine (FLEXERIL) 5 MG tablet Take 5 mg by mouth 3 (three) times daily.     . furosemide (LASIX) 40 MG tablet Take 40 mg by mouth daily.    [name] gabapentin (NEURONTIN) 300 MG capsule Take 2 capsules (600 mg total) by mouth 3 (three) times daily. 180 capsule 2  . HYDROcodone-acetaminophen (NORCO/VICODIN) 5-325 MG tablet Take 1 tablet by mouth every 4 (four) hours as needed. 15 tablet 0  . isosorbide dinitrate (ISORDIL) 20 MG tablet Take 20 mg by mouth 2 (two) times daily.     [name] KLOR-CON M10 10 MEQ tablet Take 10 mEq by mouth 2 (two) times daily.     [name] KOMBIGLYZE XR 5-500 MG TB24 Take 1 tablet by mouth every morning.     [name] lisinopril (PRINIVIL,ZESTRIL) 20 MG tablet Take 20 mg by mouth daily.     [name] loperamide (IMODIUM A-D) 2 MG tablet Take 2 mg by mouth 4 (four) times daily as needed for diarrhea or loose stools.    [name] MYRBETRIQ 25 MG TB24 tablet Take 25 mg by mouth daily.  5  . omeprazole (PRILOSEC) 20 MG  capsule Take 20 mg by mouth 2 (two) times daily.    . sertraline (ZOLOFT) 100 MG tablet Take 100 mg by mouth daily.      No current facility-administered medications on file prior to visit.     PAST MEDICAL HISTORY: Past Medical History:  Diagnosis Date  . Anginal pain ([place])    a. NL cath in 2008;  b. Myoview 03/2011: dec uptake along mid anterior wall on stress imaging -> ? attenuation vs. ischemia, EF 65%;  c. Echo 04/2011: EF 55-60%, no RWMA, Gr 2 dd  . Anxiety   . Arthritis   . Asthma   . Back pain   . Bone cancer ([place])   . Cancer ([place])   . Chest pain   . CHF (congestive heart failure) ([place])   . CHF (congestive heart failure) ([place])   . Depression   . Diabetes mellitus   . Drug use   . Dyspnea    with exertion  .  Dyspnea   . Frequent urination   . GERD (gastroesophageal reflux disease)   . Glaucoma   . Headache   . HLD (hyperlipidemia)   . Hypertension   . IBS (irritable bowel syndrome)   . Joint pain   . Lactose intolerance   . Leg edema   . 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 Obesity   . Palpitations   . Pneumonia 05/2016   double  . Pulmonary edema   . Pulmonary embolism ([place])    a. 2008 -> coumadin x 6 mos.  . Rheumatoid arthritis ([place])   . Sleep apnea    does not use CPAP  . TIA (transient ischemic attack)   . Urinary urgency     PAST SURGICAL HISTORY: Past Surgical History:  Procedure Laterality Date  . ABDOMINAL HYSTERECTOMY  2005  . CARDIAC CATHETERIZATION     Normal  . CARDIAC CATHETERIZATION N/A 09/30/2014   Procedure: Left Heart Cath and Coronary Angiography;  Surgeon: Sherren Mocha, MD;  Location: [facility];  Service: Cardiovascular;  Laterality: N/A;  . LAPAROSCOPIC APPENDECTOMY N/A 06/03/2012   Procedure: APPENDECTOMY LAPAROSCOPIC;  Surgeon: Stark Klein, MD;  Location: [facility];  Service: General;  Laterality: N/A;  . Left knee surgery  2008  . LEG SURGERY    . TONSILLECTOMY    . TOTAL KNEE ARTHROPLASTY Left 08/22/2016   Procedure: TOTAL KNEE ARTHROPLASTY;  Surgeon: Meredith Pel, MD;  Location: [facility];  Service: Orthopedics;  Laterality: Left;  . TUBAL LIGATION  1989    SOCIAL HISTORY: Social History   Tobacco Use  . Smoking status: Former Smoker    Packs/day: 0.50    Years: 15.00    Pack years: 7.50    Types: Cigarettes    Last attempt to quit: 04/24/1985    Years since quitting: 32.6  . Smokeless tobacco: Never Used  Substance Use Topics  . Alcohol use: No    Alcohol/week: 0.0 standard drinks  . Drug use: Yes    Types: Marijuana    FAMILY HISTORY: Family History  Problem Relation Age of Onset  . Emphysema Mother   . Arthritis Mother   . Heart failure Mother        alive @ 55  . Stroke Mother   . Diabetes Mother    . Hypertension Mother   . Hyperlipidemia Mother   . Depression Mother   . Anxiety disorder Mother   . Asthma Brother   .  Heart disease Father        died @ 67's.  . Stroke Father   . Diabetes Father   . Hyperlipidemia Father   . Hypertension Father   . Bipolar disorder Father   . Sleep apnea Father   . Alcoholism Father   . Drug abuse Father   . Diabetes Sister   . Heart disease Paternal Grandfather   . Colon cancer Maternal Grandfather   . Breast cancer Maternal Aunt     ROS: Review of Systems  Constitutional: Negative for weight loss.  Gastrointestinal: Negative for nausea and vomiting.  Musculoskeletal:       Negative for muscle weakness    PHYSICAL EXAM: Blood pressure 112/71, pulse 75, height 4\' 10"  (1.473 m), weight 221 lb (100.2 kg), SpO2 96 %. Body mass index is 46.19 kg/m. Physical Exam  Constitutional: She is oriented to person, place, and time. She appears well-developed and well-nourished.  Cardiovascular: Normal rate.  Pulmonary/Chest: Effort normal.  Musculoskeletal: Normal range of motion.  Neurological: She is oriented to person, place, and time.  Skin: Skin is warm and dry.  Psychiatric: She has a normal mood and affect. Her behavior is normal.  Vitals reviewed.   RECENT LABS AND TESTS: BMET    Component Value Date/Time   NA 141 11/20/2017 0925   K 3.5 11/20/2017 0925   CL 98 11/20/2017 0925   CO2 26 11/20/2017 0925   GLUCOSE 158 (H) 11/20/2017 0925   GLUCOSE 152 (H) 07/27/2017 2248   BUN 9 11/20/2017 0925   CREATININE 0.86 11/20/2017 0925   CALCIUM 9.9 11/20/2017 0925   GFRNONAA 75 11/20/2017 0925   GFRAA 86 11/20/2017 0925   Lab Results  Component Value Date   HGBA1C 8.8 (H) 11/20/2017   HGBA1C 7.4 (H) 08/11/2016   HGBA1C 6.6 (H) 05/21/2016   HGBA1C 6.2 (H) 10/28/2012   HGBA1C 6.8 (H) 01/10/2012   Lab Results  Component Value Date   INSULIN 53.9 (H) 11/20/2017   CBC    Component Value Date/Time   WBC 6.5 11/20/2017 0925     WBC 9.5 07/27/2017 2248   RBC 4.78 11/20/2017 0925   RBC 4.87 07/27/2017 2248   HGB 12.8 11/20/2017 0925   HCT 40.6 11/20/2017 0925   PLT 200 07/27/2017 2248   MCV 85 11/20/2017 0925   MCH 26.8 11/20/2017 0925   MCH 28.1 07/27/2017 2248   MCHC 31.5 11/20/2017 0925   MCHC 33.6 07/27/2017 2248   RDW 13.9 11/20/2017 0925   LYMPHSABS 2.1 11/20/2017 0925   MONOABS 0.2 05/21/2016 0800   EOSABS 0.4 11/20/2017 0925   BASOSABS 0.0 11/20/2017 0925   Iron/TIBC/Ferritin/ %Sat No results found for: IRON, TIBC, FERRITIN, IRONPCTSAT Lipid Panel     Component Value Date/Time   CHOL 158 11/20/2017 0925   TRIG 228 (H) 11/20/2017 0925   HDL 53 11/20/2017 0925   CHOLHDL 4.2 04/23/2011 0830   CHOLHDL 4.3 04/23/2011 0830   VLDL 29 04/23/2011 0830   VLDL 29 04/23/2011 0830   LDLCALC 59 11/20/2017 0925   Hepatic Function Panel     Component Value Date/Time   PROT 7.0 11/20/2017 0925   ALBUMIN 4.3 11/20/2017 0925   AST 33 11/20/2017 0925   ALT 53 (H) 11/20/2017 0925   ALKPHOS 87 11/20/2017 0925   BILITOT 0.4 11/20/2017 0925   BILIDIR 0.1 05/22/2016 0913   IBILI 0.6 05/22/2016 0913      Component Value Date/Time   TSH 0.638 11/20/2017 1610  TSH 0.112 (L) 05/24/2016 0629   TSH 0.345 (L) 06/18/2014 0930   TSH 0.167 (L) 10/28/2012 0144   Results for ELANNA, BERT (MRN [id number]) as of 12/25/2017 10:05  Ref. Range 11/20/2017 09:25  Vitamin D, 25-Hydroxy Latest Ref Range: 30.0 - 100.0 ng/mL 14.7 (L)   ASSESSMENT AND PLAN: Type 2 diabetes mellitus without complication, without long-term current use of insulin (HCC)  Vitamin D deficiency - Plan: Vitamin D, Ergocalciferol, (DRISDOL) 50000 units CAPS capsule  Class 3 severe obesity with serious comorbidity and body mass index (BMI) of 45.0 to 49.9 in adult, unspecified obesity type ([place])  PLAN:  Diabetes II Agape has been given extensive diabetes education by myself today including ideal fasting and post-prandial blood glucose  readings, individual ideal Hgb A1c goals and hypoglycemia prevention. We discussed the importance of good blood sugar control to decrease the likelihood of diabetic complications such as nephropathy, neuropathy, limb loss, blindness, coronary artery disease, and death. We discussed the importance of intensive lifestyle modification including diet, exercise and weight loss as the first line treatment for diabetes. Ishanvi agrees to discontinue Glipizide and continue Kombiglyze and diet. Ndia agrees to follow up at the agreed upon time.  Vitamin D Deficiency Kellianne was informed that low vitamin D levels contributes to fatigue and are associated with obesity, breast, and colon cancer. She agrees to continue to take prescription Vit D @50 ,000 IU every week #4 with no refills and will follow up for routine testing of vitamin D, at least 2-3 times per year. She was informed of the risk of over-replacement of vitamin D and agrees to not increase her dose unless she discusses this with [name] first. Sarahbeth agrees to follow up as directed.  Obesity Naraya is currently in the action stage of change. As such, her goal is to continue with weight loss efforts She has agreed to follow the Category 2 plan with breakfast and lunch options Mayzie has been instructed to work up to a goal of 150 minutes of combined cardio and strengthening exercise per week for weight loss and overall health benefits. We discussed the following Behavioral Modification Strategies today : no skipping meals, increasing lean protein intake and work on meal planning and easy cooking plans  Breanah has agreed to follow up with our clinic in 2 to 3 weeks. She was informed of the importance of frequent follow up visits to maximize her success with intensive lifestyle modifications for her multiple health conditions.   OBESITY BEHAVIORAL INTERVENTION VISIT  Today's visit was # 3   Starting weight: 225 lbs Starting date: 11/20/17 Today's weight  : 221 lbs Today's date: 12/20/17 Total lbs lost to date: 4 At least 15 minutes were spent on discussing the following behavioral intervention visit.   ASK: We discussed the diagnosis of obesity with Edwyna Ready today and Twilia agreed to give [name] permission to discuss obesity behavioral modification therapy today.  ASSESS: Shaquela has the diagnosis of obesity and her BMI today is 46.12 Tida is in the action stage of change   ADVISE: Rolla was educated on the multiple health risks of obesity as well as the benefit of weight loss to improve her health. She was advised of the need for long term treatment and the importance of lifestyle modifications to improve her current health and to decrease her risk of future health problems.  AGREE: Multiple dietary modification options and treatment options were discussed and  Latrell agreed to follow the recommendations documented in the above  note.  ARRANGE: Anina was educated on the importance of frequent visits to treat obesity as outlined per CMS and USPSTF guidelines and agreed to schedule her next follow up appointment today.  I, Doreene Nest, am acting as transcriptionist for Dennard Nip, MD  I have reviewed the above documentation for accuracy and completeness, and I agree with the above. -Dennard Nip, MD

## 2018-01-10 ENCOUNTER — Ambulatory Visit (INDEPENDENT_AMBULATORY_CARE_PROVIDER_SITE_OTHER): Payer: Medicare Other | Admitting: Family Medicine

## 2018-01-10 VITALS — BP 126/79 | HR 82 | Temp 97.9°F | Ht <= 58 in | Wt 219.0 lb

## 2018-01-10 DIAGNOSIS — G4709 Other insomnia: Secondary | ICD-10-CM | POA: Diagnosis not present

## 2018-01-10 DIAGNOSIS — Z6841 Body Mass Index (BMI) 40.0 and over, adult: Secondary | ICD-10-CM | POA: Diagnosis not present

## 2018-01-10 DIAGNOSIS — E559 Vitamin D deficiency, unspecified: Secondary | ICD-10-CM

## 2018-01-10 MED ORDER — VITAMIN D (ERGOCALCIFEROL) 1.25 MG (50000 UNIT) PO CAPS: 50000.0000 [IU] | ORAL_CAPSULE | ORAL | 0 refills | Status: DC

## 2018-01-10 MED ORDER — CYCLOBENZAPRINE HCL 5 MG PO TABS: 5.0000 mg | ORAL_TABLET | Freq: Every day | ORAL | 0 refills | Status: DC

## 2018-01-14 NOTE — Progress Notes (Signed)
Office: [phone]  /  Fax: [phone]   HPI:   Chief Complaint: OBESITY [name] is here to discuss her progress with her obesity treatment plan. She is on the Category 2 plan with breakfast and lunch options and she is following her eating plan approximately 60 % of the time. She states she is exercising 20 to 60 minutes 2 to 4 times per week. [name] continues to do well with weight loss. [name] just had a good friend of hers to die and she was at the funeral today. She has been missing meals while she has been grieving her friend and she is not sleeping well. Her weight is 219 lb (99.3 kg) today and has had a weight loss of 2 pounds over a period of 3 weeks since her last visit. She has lost 6 lbs since starting treatment with [name].  Vitamin D deficiency [name] has a diagnosis of vitamin D deficiency. She is  Stable on  vit D, but she is not yet at goal. [name] denies nausea, vomiting or muscle weakness.  Insomnia [name] is struggling to sleep well with her friends recent death. She has been on flexeril in the past for muscle pain, but she is out of her medication now.  ALLERGIES: Allergies  Allergen Reactions  . Penicillins Swelling    Has patient had a PCN reaction causing immediate rash, facial/tongue/throat swelling, SOB or lightheadedness with hypotension: YES Has patient had a PCN reaction causing severe rash involving mucus membranes or skin necrosis: NO Has patient had a PCN reaction that required hospitalization NO Has patient had a PCN reaction occurring within the last 10 years: NO If all of the above answers are "NO", then may proceed with Cephalosporin use.  . Latex Rash  . Sulfa Antibiotics Diarrhea, Itching and Rash    MEDICATIONS: Current Outpatient Medications on File Prior to Visit  Medication Sig Dispense Refill  . amLODipine (NORVASC) 5 MG tablet Take 5 mg by mouth daily.  6  . aspirin EC 81 MG tablet Take 81 mg by mouth daily.    [name] atorvastatin (LIPITOR)  40 MG tablet Take 40 mg by mouth at bedtime.     . carvedilol (COREG CR) 10 MG 24 hr capsule Take 10 mg by mouth daily.    . furosemide (LASIX) 40 MG tablet Take 40 mg by mouth daily.    [name] gabapentin (NEURONTIN) 300 MG capsule Take 2 capsules (600 mg total) by mouth 3 (three) times daily. 180 capsule 2  . HYDROcodone-acetaminophen (NORCO/VICODIN) 5-325 MG tablet Take 1 tablet by mouth every 4 (four) hours as needed. 15 tablet 0  . isosorbide dinitrate (ISORDIL) 20 MG tablet Take 20 mg by mouth 2 (two) times daily.     [name] KLOR-CON M10 10 MEQ tablet Take 10 mEq by mouth 2 (two) times daily.     [name] KOMBIGLYZE XR 5-500 MG TB24 Take 1 tablet by mouth every morning.     [name] lisinopril (PRINIVIL,ZESTRIL) 20 MG tablet Take 20 mg by mouth daily.     [name] loperamide (IMODIUM A-D) 2 MG tablet Take 2 mg by mouth 4 (four) times daily as needed for diarrhea or loose stools.    [name] MYRBETRIQ 25 MG TB24 tablet Take 25 mg by mouth daily.  5  . omeprazole (PRILOSEC) 20 MG capsule Take 20 mg by mouth 2 (two) times daily.    . sertraline (ZOLOFT) 100 MG tablet Take 100 mg by mouth daily.      No  current facility-administered medications on file prior to visit.     PAST MEDICAL HISTORY: Past Medical History:  Diagnosis Date  . Anginal pain ([place])    a. NL cath in 2008;  b. Myoview 03/2011: dec uptake along mid anterior wall on stress imaging -> ? attenuation vs. ischemia, EF 65%;  c. Echo 04/2011: EF 55-60%, no RWMA, Gr 2 dd  . Anxiety   . Arthritis   . Asthma   . Back pain   . Bone cancer ([place])   . Cancer ([place])   . Chest pain   . CHF (congestive heart failure) ([place])   . CHF (congestive heart failure) ([place])   . Depression   . Diabetes mellitus   . Drug use   . Dyspnea    with exertion  . Dyspnea   . Frequent urination   . GERD (gastroesophageal reflux disease)   . Glaucoma   . Headache   . HLD (hyperlipidemia)   . Hypertension   . IBS (irritable bowel syndrome)   . Joint pain   . Lactose intolerance     . Leg edema   . 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 Obesity   . Palpitations   . Pneumonia 05/2016   double  . Pulmonary edema   . Pulmonary embolism ([place])    a. 2008 -> coumadin x 6 mos.  . Rheumatoid arthritis ([place])   . Sleep apnea    does not use CPAP  . TIA (transient ischemic attack)   . Urinary urgency     PAST SURGICAL HISTORY: Past Surgical History:  Procedure Laterality Date  . ABDOMINAL HYSTERECTOMY  2005  . CARDIAC CATHETERIZATION     Normal  . CARDIAC CATHETERIZATION N/A 09/30/2014   Procedure: Left Heart Cath and Coronary Angiography;  Surgeon: Sherren Mocha, MD;  Location: [facility];  Service: Cardiovascular;  Laterality: N/A;  . LAPAROSCOPIC APPENDECTOMY N/A 06/03/2012   Procedure: APPENDECTOMY LAPAROSCOPIC;  Surgeon: Stark Klein, MD;  Location: [facility];  Service: General;  Laterality: N/A;  . Left knee surgery  2008  . LEG SURGERY    . TONSILLECTOMY    . TOTAL KNEE ARTHROPLASTY Left 08/22/2016   Procedure: TOTAL KNEE ARTHROPLASTY;  Surgeon: Meredith Pel, MD;  Location: [facility];  Service: Orthopedics;  Laterality: Left;  . TUBAL LIGATION  1989    SOCIAL HISTORY: Social History   Tobacco Use  . Smoking status: Former Smoker    Packs/day: 0.50    Years: 15.00    Pack years: 7.50    Types: Cigarettes    Last attempt to quit: 04/24/1985    Years since quitting: 32.7  . Smokeless tobacco: Never Used  Substance Use Topics  . Alcohol use: No    Alcohol/week: 0.0 standard drinks  . Drug use: Yes    Types: Marijuana    FAMILY HISTORY: Family History  Problem Relation Age of Onset  . Emphysema Mother   . Arthritis Mother   . Heart failure Mother        alive @ 76  . Stroke Mother   . Diabetes Mother   . Hypertension Mother   . Hyperlipidemia Mother   . Depression Mother   . Anxiety disorder Mother   . Asthma Brother   . Heart disease Father        died @ 90's.  . Stroke Father   . Diabetes Father   .  Hyperlipidemia Father   .  Hypertension Father   . Bipolar disorder Father   . Sleep apnea Father   . Alcoholism Father   . Drug abuse Father   . Diabetes Sister   . Heart disease Paternal Grandfather   . Colon cancer Maternal Grandfather   . Breast cancer Maternal Aunt     ROS: Review of Systems  Constitutional: Positive for weight loss.  Gastrointestinal: Negative for nausea and vomiting.  Musculoskeletal:       Negative for muscle weakness  Psychiatric/Behavioral: The patient has insomnia.        + grief    PHYSICAL EXAM: Blood pressure 126/79, pulse 82, temperature 97.9 F (36.6 C), temperature source Oral, height 4\' 10"  (1.473 m), weight 219 lb (99.3 kg), SpO2 94 %. Body mass index is 45.77 kg/m. Physical Exam  Constitutional: She is oriented to person, place, and time. She appears well-developed and well-nourished.  Cardiovascular: Normal rate.  Pulmonary/Chest: Effort normal.  Musculoskeletal: Normal range of motion.  Neurological: She is oriented to person, place, and time.  Skin: Skin is warm and dry.  Psychiatric: She has a normal mood and affect. Her behavior is normal.  Vitals reviewed.   RECENT LABS AND TESTS: BMET    Component Value Date/Time   NA 141 11/20/2017 0925   K 3.5 11/20/2017 0925   CL 98 11/20/2017 0925   CO2 26 11/20/2017 0925   GLUCOSE 158 (H) 11/20/2017 0925   GLUCOSE 152 (H) 07/27/2017 2248   BUN 9 11/20/2017 0925   CREATININE 0.86 11/20/2017 0925   CALCIUM 9.9 11/20/2017 0925   GFRNONAA 75 11/20/2017 0925   GFRAA 86 11/20/2017 0925   Lab Results  Component Value Date   HGBA1C 8.8 (H) 11/20/2017   HGBA1C 7.4 (H) 08/11/2016   HGBA1C 6.6 (H) 05/21/2016   HGBA1C 6.2 (H) 10/28/2012   HGBA1C 6.8 (H) 01/10/2012   Lab Results  Component Value Date   INSULIN 53.9 (H) 11/20/2017   CBC    Component Value Date/Time   WBC 6.5 11/20/2017 0925   WBC 9.5 07/27/2017 2248   RBC 4.78 11/20/2017 0925   RBC 4.87 07/27/2017 2248   HGB  12.8 11/20/2017 0925   HCT 40.6 11/20/2017 0925   PLT 200 07/27/2017 2248   MCV 85 11/20/2017 0925   MCH 26.8 11/20/2017 0925   MCH 28.1 07/27/2017 2248   MCHC 31.5 11/20/2017 0925   MCHC 33.6 07/27/2017 2248   RDW 13.9 11/20/2017 0925   LYMPHSABS 2.1 11/20/2017 0925   MONOABS 0.2 05/21/2016 0800   EOSABS 0.4 11/20/2017 0925   BASOSABS 0.0 11/20/2017 0925   Iron/TIBC/Ferritin/ %Sat No results found for: IRON, TIBC, FERRITIN, IRONPCTSAT Lipid Panel     Component Value Date/Time   CHOL 158 11/20/2017 0925   TRIG 228 (H) 11/20/2017 0925   HDL 53 11/20/2017 0925   CHOLHDL 4.2 04/23/2011 0830   CHOLHDL 4.3 04/23/2011 0830   VLDL 29 04/23/2011 0830   VLDL 29 04/23/2011 0830   LDLCALC 59 11/20/2017 0925   Hepatic Function Panel     Component Value Date/Time   PROT 7.0 11/20/2017 0925   ALBUMIN 4.3 11/20/2017 0925   AST 33 11/20/2017 0925   ALT 53 (H) 11/20/2017 0925   ALKPHOS 87 11/20/2017 0925   BILITOT 0.4 11/20/2017 0925   BILIDIR 0.1 05/22/2016 0913   IBILI 0.6 05/22/2016 0913      Component Value Date/Time   TSH 0.638 11/20/2017 0925   TSH 0.112 (L) 05/24/2016 0629   TSH  0.345 (L) 06/18/2014 0930   TSH 0.167 (L) 10/28/2012 0144   Results for STARLINA, LAPRE (MRN [id number]) as of 01/14/2018 12:25  Ref. Range 11/20/2017 09:25  Vitamin D, 25-Hydroxy Latest Ref Range: 30.0 - 100.0 ng/mL 14.7 (L)   ASSESSMENT AND PLAN: Vitamin D deficiency - Plan: Vitamin D, Ergocalciferol, (DRISDOL) 50000 units CAPS capsule  Other insomnia - Plan: cyclobenzaprine (FLEXERIL) 5 MG tablet  Class 3 severe obesity with serious comorbidity and body mass index (BMI) of 45.0 to 49.9 in adult, unspecified obesity type ([place])  PLAN:  Vitamin D Deficiency [name] was informed that low vitamin D levels contributes to fatigue and are associated with obesity, breast, and colon cancer. She agrees to continue to take prescription Vit D @50 ,000 IU every week #4 with no refills and will follow  up for routine testing of vitamin D, at least 2-3 times per year. She was informed of the risk of over-replacement of vitamin D and agrees to not increase her dose unless she discusses this with [name] first. [name] agrees to follow up as directed.  Insomnia The problem of insomnia was discussed. Avoidance of caffeine sources was strongly encouraged and sleep hygiene issues were reviewed. [name] agrees to continue Flexeril 5 mg qHS #15 with no refills and follow up with our clinic as directed.  Obesity [name] is currently in the action stage of change. As such, her goal is to continue with weight loss efforts She has agreed to follow the Category 2 plan [name] has been instructed to work up to a goal of 150 minutes of combined cardio and strengthening exercise per week for weight loss and overall health benefits. We discussed the following Behavioral Modification Strategies today: no skipping meals, increasing lean protein intake and emotional eating strategies  [name] has agreed to follow up with our clinic in 3 weeks. She was informed of the importance of frequent follow up visits to maximize her success with intensive lifestyle modifications for her multiple health conditions.   OBESITY BEHAVIORAL INTERVENTION VISIT  Today's visit was # 4   Starting weight: 225 lbs Starting date: 11/20/17 Today's weight : 219  lbs  Today's date: 01/10/2018 Total lbs lost to date: 6 At least 15 minutes were spent on discussing the following behavioral intervention visit.   ASK: We discussed the diagnosis of obesity with [name] today and [name] agreed to give [name] permission to discuss obesity behavioral modification therapy today.  ASSESS: [name] has the diagnosis of obesity and her BMI today is 45.78 [name] is in the action stage of change   ADVISE: [name] was educated on the multiple health risks of obesity as well as the benefit of weight loss to improve her health. She was advised of the  need for long term treatment and the importance of lifestyle modifications to improve her current health and to decrease her risk of future health problems.  AGREE: Multiple dietary modification options and treatment options were discussed and  [name] agreed to follow the recommendations documented in the above note.  ARRANGE: [name] was educated on the importance of frequent visits to treat obesity as outlined per CMS and USPSTF guidelines and agreed to schedule her next follow up appointment today.  I, Doreene Nest, am acting as transcriptionist for Dennard Nip, MD  I have reviewed the above documentation for accuracy and completeness, and I agree with the above. -Dennard Nip, MD

## 2018-01-24 ENCOUNTER — Ambulatory Visit (INDEPENDENT_AMBULATORY_CARE_PROVIDER_SITE_OTHER): Payer: Medicare Other | Admitting: Physician Assistant

## 2018-01-24 ENCOUNTER — Encounter (INDEPENDENT_AMBULATORY_CARE_PROVIDER_SITE_OTHER): Payer: Self-pay | Admitting: Physician Assistant

## 2018-01-24 VITALS — BP 136/76 | HR 60 | Temp 98.3°F | Ht <= 58 in | Wt 218.0 lb

## 2018-01-24 DIAGNOSIS — E559 Vitamin D deficiency, unspecified: Secondary | ICD-10-CM | POA: Diagnosis not present

## 2018-01-24 DIAGNOSIS — E119 Type 2 diabetes mellitus without complications: Secondary | ICD-10-CM

## 2018-01-24 DIAGNOSIS — Z6841 Body Mass Index (BMI) 40.0 and over, adult: Secondary | ICD-10-CM

## 2018-01-24 MED ORDER — VITAMIN D (ERGOCALCIFEROL) 1.25 MG (50000 UNIT) PO CAPS: 50000.0000 [IU] | ORAL_CAPSULE | ORAL | 0 refills | Status: DC

## 2018-01-25 ENCOUNTER — Telehealth (INDEPENDENT_AMBULATORY_CARE_PROVIDER_SITE_OTHER): Payer: Self-pay | Admitting: Orthopedic Surgery

## 2018-01-25 DIAGNOSIS — G8929 Other chronic pain: Secondary | ICD-10-CM

## 2018-01-25 DIAGNOSIS — M5442 Lumbago with sciatica, left side: Principal | ICD-10-CM

## 2018-01-25 NOTE — Telephone Encounter (Signed)
Per your last OV note, I ordered lumbar ESI. Patient aware she will be contacted to get scheduled.

## 2018-01-25 NOTE — Telephone Encounter (Signed)
Patient called asked if she can be referred back to Dr Ernestina Patches for an injection in her back. The number to contact is [phone]

## 2018-01-28 NOTE — Progress Notes (Signed)
Office: [phone]  /  Fax: [phone]   HPI:   Chief Complaint: OBESITY [name] is here to discuss her progress with her obesity treatment plan. She is on the Category 2 plan and is following her eating plan approximately 70 % of the time. She states she is walking for 15 minutes 5 times per week. [name] did well with weight loss. She states that she is not getting all of her protein in especially at dinner. She is under a lot of family stress.  Her weight is 218 lb (98.9 kg) today and has had a weight loss of 1 pound over a period of 2 weeks since her last visit. She has lost 7 lbs since starting treatment with [name].  Vitamin D Deficiency [name] has a diagnosis of vitamin D deficiency. She is currently taking prescription Vit D and denies nausea, vomiting or muscle weakness.  Diabetes II [name] has a diagnosis of diabetes type II. [name] states fasting BGs range between 93 and 128, and she denies polyphagia or hypoglycemia. She denies nausea, vomiting, or diarrhea on saxagliptin-metformin. Last A1c was 8.8. She has been working on intensive lifestyle modifications including diet, exercise, and weight loss to help control her blood glucose levels.  ALLERGIES: Allergies  Allergen Reactions  . Penicillins Swelling    Has patient had a PCN reaction causing immediate rash, facial/tongue/throat swelling, SOB or lightheadedness with hypotension: YES Has patient had a PCN reaction causing severe rash involving mucus membranes or skin necrosis: NO Has patient had a PCN reaction that required hospitalization NO Has patient had a PCN reaction occurring within the last 10 years: NO If all of the above answers are "NO", then may proceed with Cephalosporin use.  . Latex Rash  . Sulfa Antibiotics Diarrhea, Itching and Rash    MEDICATIONS: Current Outpatient Medications on File Prior to Visit  Medication Sig Dispense Refill  . amLODipine (NORVASC) 5 MG tablet Take 5 mg by mouth daily.  6  .  aspirin EC 81 MG tablet Take 81 mg by mouth daily.    [name] atorvastatin (LIPITOR) 40 MG tablet Take 40 mg by mouth at bedtime.     . carvedilol (COREG CR) 10 MG 24 hr capsule Take 10 mg by mouth daily.    . cyclobenzaprine (FLEXERIL) 5 MG tablet Take 1 tablet (5 mg total) by mouth at bedtime. 15 tablet 0  . furosemide (LASIX) 40 MG tablet Take 40 mg by mouth daily.    [name] gabapentin (NEURONTIN) 300 MG capsule Take 2 capsules (600 mg total) by mouth 3 (three) times daily. 180 capsule 2  . HYDROcodone-acetaminophen (NORCO/VICODIN) 5-325 MG tablet Take 1 tablet by mouth every 4 (four) hours as needed. 15 tablet 0  . isosorbide dinitrate (ISORDIL) 20 MG tablet Take 20 mg by mouth 2 (two) times daily.     [name] KLOR-CON M10 10 MEQ tablet Take 10 mEq by mouth 2 (two) times daily.     [name] KOMBIGLYZE XR 5-500 MG TB24 Take 1 tablet by mouth every morning.     [name] lisinopril (PRINIVIL,ZESTRIL) 20 MG tablet Take 20 mg by mouth daily.     [name] loperamide (IMODIUM A-D) 2 MG tablet Take 2 mg by mouth 4 (four) times daily as needed for diarrhea or loose stools.    [name] MYRBETRIQ 25 MG TB24 tablet Take 25 mg by mouth daily.  5  . omeprazole (PRILOSEC) 20 MG capsule Take 20 mg by mouth 2 (two) times daily.    [name]  sertraline (ZOLOFT) 100 MG tablet Take 100 mg by mouth daily.      No current facility-administered medications on file prior to visit.     PAST MEDICAL HISTORY: Past Medical History:  Diagnosis Date  . Anginal pain ([place])    a. NL cath in 2008;  b. Myoview 03/2011: dec uptake along mid anterior wall on stress imaging -> ? attenuation vs. ischemia, EF 65%;  c. Echo 04/2011: EF 55-60%, no RWMA, Gr 2 dd  . Anxiety   . Arthritis   . Asthma   . Back pain   . Bone cancer ([place])   . Cancer ([place])   . Chest pain   . CHF (congestive heart failure) ([place])   . CHF (congestive heart failure) ([place])   . Depression   . Diabetes mellitus   . Drug use   . Dyspnea    with exertion  . Dyspnea   . Frequent urination   . GERD  (gastroesophageal reflux disease)   . Glaucoma   . Headache   . HLD (hyperlipidemia)   . Hypertension   . IBS (irritable bowel syndrome)   . Joint pain   . Lactose intolerance   . Leg edema   . 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 Obesity   . Palpitations   . Pneumonia 05/2016   double  . Pulmonary edema   . Pulmonary embolism ([place])    a. 2008 -> coumadin x 6 mos.  . Rheumatoid arthritis ([place])   . Sleep apnea    does not use CPAP  . TIA (transient ischemic attack)   . Urinary urgency     PAST SURGICAL HISTORY: Past Surgical History:  Procedure Laterality Date  . ABDOMINAL HYSTERECTOMY  2005  . CARDIAC CATHETERIZATION     Normal  . CARDIAC CATHETERIZATION N/A 09/30/2014   Procedure: Left Heart Cath and Coronary Angiography;  Surgeon: Sherren Mocha, MD;  Location: [facility];  Service: Cardiovascular;  Laterality: N/A;  . LAPAROSCOPIC APPENDECTOMY N/A 06/03/2012   Procedure: APPENDECTOMY LAPAROSCOPIC;  Surgeon: Stark Klein, MD;  Location: [facility];  Service: General;  Laterality: N/A;  . Left knee surgery  2008  . LEG SURGERY    . TONSILLECTOMY    . TOTAL KNEE ARTHROPLASTY Left 08/22/2016   Procedure: TOTAL KNEE ARTHROPLASTY;  Surgeon: Meredith Pel, MD;  Location: [facility];  Service: Orthopedics;  Laterality: Left;  . TUBAL LIGATION  1989    SOCIAL HISTORY: Social History   Tobacco Use  . Smoking status: Former Smoker    Packs/day: 0.50    Years: 15.00    Pack years: 7.50    Types: Cigarettes    Last attempt to quit: 04/24/1985    Years since quitting: 32.7  . Smokeless tobacco: Never Used  Substance Use Topics  . Alcohol use: No    Alcohol/week: 0.0 standard drinks  . Drug use: Yes    Types: Marijuana    FAMILY HISTORY: Family History  Problem Relation Age of Onset  . Emphysema Mother   . Arthritis Mother   . Heart failure Mother        alive @ 64  . Stroke Mother   . Diabetes Mother   . Hypertension Mother   .  Hyperlipidemia Mother   . Depression Mother   . Anxiety disorder Mother   . Asthma Brother   . Heart disease Father        died @  106's.  . Stroke Father   . Diabetes Father   . Hyperlipidemia Father   . Hypertension Father   . Bipolar disorder Father   . Sleep apnea Father   . Alcoholism Father   . Drug abuse Father   . Diabetes Sister   . Heart disease Paternal Grandfather   . Colon cancer Maternal Grandfather   . Breast cancer Maternal Aunt     ROS: Review of Systems  Constitutional: Positive for weight loss.  Gastrointestinal: Negative for diarrhea, nausea and vomiting.  Musculoskeletal:       Negative muscle weakness  Endo/Heme/Allergies:       Negative hypoglycemia Negative polyphagia    PHYSICAL EXAM: Blood pressure 136/76, pulse 60, temperature 98.3 F (36.8 C), temperature source Oral, height 4\' 10"  (1.473 m), weight 218 lb (98.9 kg), SpO2 100 %. Body mass index is 45.56 kg/m. Physical Exam  Constitutional: She is oriented to person, place, and time. She appears well-developed and well-nourished.  Cardiovascular: Normal rate.  Pulmonary/Chest: Effort normal.  Musculoskeletal: Normal range of motion.  Neurological: She is oriented to person, place, and time.  Skin: Skin is warm and dry.  Psychiatric: She has a normal mood and affect. Her behavior is normal.  Vitals reviewed.   RECENT LABS AND TESTS: BMET    Component Value Date/Time   NA 141 11/20/2017 0925   K 3.5 11/20/2017 0925   CL 98 11/20/2017 0925   CO2 26 11/20/2017 0925   GLUCOSE 158 (H) 11/20/2017 0925   GLUCOSE 152 (H) 07/27/2017 2248   BUN 9 11/20/2017 0925   CREATININE 0.86 11/20/2017 0925   CALCIUM 9.9 11/20/2017 0925   GFRNONAA 75 11/20/2017 0925   GFRAA 86 11/20/2017 0925   Lab Results  Component Value Date   HGBA1C 8.8 (H) 11/20/2017   HGBA1C 7.4 (H) 08/11/2016   HGBA1C 6.6 (H) 05/21/2016   HGBA1C 6.2 (H) 10/28/2012   HGBA1C 6.8 (H) 01/10/2012   Lab Results  Component  Value Date   INSULIN 53.9 (H) 11/20/2017   CBC    Component Value Date/Time   WBC 6.5 11/20/2017 0925   WBC 9.5 07/27/2017 2248   RBC 4.78 11/20/2017 0925   RBC 4.87 07/27/2017 2248   HGB 12.8 11/20/2017 0925   HCT 40.6 11/20/2017 0925   PLT 200 07/27/2017 2248   MCV 85 11/20/2017 0925   MCH 26.8 11/20/2017 0925   MCH 28.1 07/27/2017 2248   MCHC 31.5 11/20/2017 0925   MCHC 33.6 07/27/2017 2248   RDW 13.9 11/20/2017 0925   LYMPHSABS 2.1 11/20/2017 0925   MONOABS 0.2 05/21/2016 0800   EOSABS 0.4 11/20/2017 0925   BASOSABS 0.0 11/20/2017 0925   Iron/TIBC/Ferritin/ %Sat No results found for: IRON, TIBC, FERRITIN, IRONPCTSAT Lipid Panel     Component Value Date/Time   CHOL 158 11/20/2017 0925   TRIG 228 (H) 11/20/2017 0925   HDL 53 11/20/2017 0925   CHOLHDL 4.2 04/23/2011 0830   CHOLHDL 4.3 04/23/2011 0830   VLDL 29 04/23/2011 0830   VLDL 29 04/23/2011 0830   LDLCALC 59 11/20/2017 0925   Hepatic Function Panel     Component Value Date/Time   PROT 7.0 11/20/2017 0925   ALBUMIN 4.3 11/20/2017 0925   AST 33 11/20/2017 0925   ALT 53 (H) 11/20/2017 0925   ALKPHOS 87 11/20/2017 0925   BILITOT 0.4 11/20/2017 0925   BILIDIR 0.1 05/22/2016 0913   IBILI 0.6 05/22/2016 0913      Component Value Date/Time  TSH 0.638 11/20/2017 0925   TSH 0.112 (L) 05/24/2016 0629   TSH 0.345 (L) 06/18/2014 0930   TSH 0.167 (L) 10/28/2012 0144  Results for TASHAWNDA, BLEILER (MRN [id number]) as of 01/28/2018 08:19  Ref. Range 11/20/2017 09:25  Vitamin D, 25-Hydroxy Latest Ref Range: 30.0 - 100.0 ng/mL 14.7 (L)    ASSESSMENT AND PLAN: Vitamin D deficiency - Plan: Vitamin D, Ergocalciferol, (DRISDOL) 50000 units CAPS capsule  Type 2 diabetes mellitus without complication, without long-term current use of insulin (HCC)  Class 3 severe obesity with serious comorbidity and body mass index (BMI) of 45.0 to 49.9 in adult, unspecified obesity type ([place])  PLAN:  Vitamin D Deficiency [name]  was informed that low vitamin D levels contributes to fatigue and are associated with obesity, breast, and colon cancer. [name] agrees to continue taking prescription Vit D @50 ,000 IU every week #4 and we will refill for 1 month. She will follow up for routine testing of vitamin D, at least 2-3 times per year. She was informed of the risk of over-replacement of vitamin D and agrees to not increase her dose unless she discusses this with [name] first. [name] agrees to follow up with our clinic in 3 weeks.  Diabetes II [name] has been given extensive diabetes education by myself today including ideal fasting and post-prandial blood glucose readings, individual ideal Hgb A1c goals and hypoglycemia prevention. We discussed the importance of good blood sugar control to decrease the likelihood of diabetic complications such as nephropathy, neuropathy, limb loss, blindness, coronary artery disease, and death. We discussed the importance of intensive lifestyle modification including diet, exercise and weight loss as the first line treatment for diabetes. [name] agrees to continue her diabetes medications, diet, and exercise, and she agrees to follow up with our clinic in 3 weeks.  Obesity [name] is currently in the action stage of change. As such, her goal is to continue with weight loss efforts She has agreed to follow the Category 2 plan [name] has been instructed to work up to a goal of 150 minutes of combined cardio and strengthening exercise per week for weight loss and overall health benefits. We discussed the following Behavioral Modification Strategies today: increasing lean protein intake, work on meal planning and easy cooking plans, and keeping healthy foods in the home   [name] has agreed to follow up with our clinic in 3 weeks. She was informed of the importance of frequent follow up visits to maximize her success with intensive lifestyle modifications for her multiple health conditions.   OBESITY  BEHAVIORAL INTERVENTION VISIT  Today's visit was # 5  Starting weight: 225 lbs Starting date: 11/20/17 Today's weight : 218 lbs Today's date: 01/24/2018 Total lbs lost to date: 7 At least 15 minutes were spent on discussing the following behavioral intervention visit.   ASK: We discussed the diagnosis of obesity with [name] today and [name] agreed to give [name] permission to discuss obesity behavioral modification therapy today.  ASSESS: [name] has the diagnosis of obesity and her BMI today is 45.57 [name] is in the action stage of change   ADVISE: [name] was educated on the multiple health risks of obesity as well as the benefit of weight loss to improve her health. She was advised of the need for long term treatment and the importance of lifestyle modifications.  AGREE: Multiple dietary modification options and treatment options were discussed and  [name] agreed to the above obesity treatment plan.  [name], am acting  as transcriptionist for Abby Potash, PA-C I, Abby Potash, PA-C have reviewed above note and agree with its content

## 2018-02-06 ENCOUNTER — Other Ambulatory Visit (INDEPENDENT_AMBULATORY_CARE_PROVIDER_SITE_OTHER): Payer: Self-pay | Admitting: Family Medicine

## 2018-02-06 ENCOUNTER — Encounter (HOSPITAL_COMMUNITY): Payer: Self-pay | Admitting: *Deleted

## 2018-02-06 ENCOUNTER — Emergency Department (HOSPITAL_COMMUNITY): Payer: Medicare Other

## 2018-02-06 ENCOUNTER — Emergency Department (HOSPITAL_COMMUNITY)
Admission: EM | Admit: 2018-02-06 | Discharge: 2018-02-06 | Disposition: A | Discharge status: Home / Self Care | Payer: Medicare Other | Attending: Emergency Medicine | Admitting: Emergency Medicine

## 2018-02-06 DIAGNOSIS — M79642 Pain in left hand: Secondary | ICD-10-CM | POA: Diagnosis present

## 2018-02-06 DIAGNOSIS — Z87891 Personal history of nicotine dependence: Secondary | ICD-10-CM | POA: Insufficient documentation

## 2018-02-06 DIAGNOSIS — I110 Hypertensive heart disease with heart failure: Secondary | ICD-10-CM | POA: Diagnosis not present

## 2018-02-06 DIAGNOSIS — G4709 Other insomnia: Secondary | ICD-10-CM

## 2018-02-06 DIAGNOSIS — E119 Type 2 diabetes mellitus without complications: Secondary | ICD-10-CM | POA: Diagnosis not present

## 2018-02-06 DIAGNOSIS — Z96652 Presence of left artificial knee joint: Secondary | ICD-10-CM | POA: Diagnosis not present

## 2018-02-06 DIAGNOSIS — Z91040 Latex allergy status: Secondary | ICD-10-CM | POA: Diagnosis not present

## 2018-02-06 DIAGNOSIS — I509 Heart failure, unspecified: Secondary | ICD-10-CM | POA: Diagnosis not present

## 2018-02-06 DIAGNOSIS — Z7982 Long term (current) use of aspirin: Secondary | ICD-10-CM | POA: Insufficient documentation

## 2018-02-06 DIAGNOSIS — Z79899 Other long term (current) drug therapy: Secondary | ICD-10-CM | POA: Diagnosis not present

## 2018-02-06 DIAGNOSIS — M549 Dorsalgia, unspecified: Secondary | ICD-10-CM | POA: Insufficient documentation

## 2018-02-06 DIAGNOSIS — W19XXXA Unspecified fall, initial encounter: Secondary | ICD-10-CM

## 2018-02-06 MED ORDER — LIDOCAINE 5 % EX PTCH: 1.0000 | MEDICATED_PATCH | CUTANEOUS | 0 refills | Status: DC

## 2018-02-06 MED ORDER — DICLOFENAC SODIUM 1 % TD GEL: 4.0000 g | Freq: Four times a day (QID) | TRANSDERMAL | 0 refills | Status: DC

## 2018-02-06 NOTE — ED Triage Notes (Signed)
Unable to lolcate Pt in front lobby for room A-6

## 2018-02-06 NOTE — ED Triage Notes (Signed)
Pt is in X-ray Dept. Will come to rfoom A-6
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
## 2018-02-06 NOTE — Discharge Instructions (Addendum)
By Voltaren gel as prescribed. Apply Lidoderm patch as needed as prescribed. Apply warm compresses to sore muscles for 20 minutes at a time.   Follow-up with your primary care provider, please call their office to schedule an appointment.
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
## 2018-02-11 ENCOUNTER — Encounter (INDEPENDENT_AMBULATORY_CARE_PROVIDER_SITE_OTHER): Payer: Self-pay | Admitting: Physical Medicine and Rehabilitation

## 2018-02-11 ENCOUNTER — Ambulatory Visit (INDEPENDENT_AMBULATORY_CARE_PROVIDER_SITE_OTHER): Payer: Self-pay

## 2018-02-11 ENCOUNTER — Encounter

## 2018-02-11 ENCOUNTER — Ambulatory Visit (INDEPENDENT_AMBULATORY_CARE_PROVIDER_SITE_OTHER): Payer: Medicare Other | Admitting: Physical Medicine and Rehabilitation

## 2018-02-11 ENCOUNTER — Ambulatory Visit (INDEPENDENT_AMBULATORY_CARE_PROVIDER_SITE_OTHER): Payer: Medicare Other | Admitting: Physician Assistant

## 2018-02-11 VITALS — BP 149/84 | HR 75 | Temp 98.3°F

## 2018-02-11 VITALS — BP 131/83 | HR 70 | Temp 97.9°F | Ht <= 58 in | Wt 211.0 lb

## 2018-02-11 DIAGNOSIS — M5416 Radiculopathy, lumbar region: Secondary | ICD-10-CM

## 2018-02-11 DIAGNOSIS — Z6841 Body Mass Index (BMI) 40.0 and over, adult: Secondary | ICD-10-CM | POA: Diagnosis not present

## 2018-02-11 DIAGNOSIS — M48061 Spinal stenosis, lumbar region without neurogenic claudication: Secondary | ICD-10-CM

## 2018-02-11 DIAGNOSIS — M25552 Pain in left hip: Secondary | ICD-10-CM

## 2018-02-11 DIAGNOSIS — E119 Type 2 diabetes mellitus without complications: Secondary | ICD-10-CM | POA: Diagnosis not present

## 2018-02-11 MED ORDER — BETAMETHASONE SOD PHOS & ACET 6 (3-3) MG/ML IJ SUSP: 12.0000 mg | Freq: Once | INTRAMUSCULAR | Status: DC

## 2018-02-11 NOTE — Progress Notes (Signed)
 .  Numeric Pain Rating Scale and Functional Assessment Average Pain 8   In the last MONTH (on 0-10 scale) has pain interfered with the following?  1. General activity like being  able to carry out your everyday physical activities such as walking, climbing stairs, carrying groceries, or moving a chair?  Rating(4)   +Driver, -BT, -Dye Allergies.  

## 2018-02-11 NOTE — Progress Notes (Signed)
[name] - 57 y.o. female MRN [id number]  Date of birth: 07-10-1960  Office Visit Note: Visit Date: 02/11/2018 PCP: Velna Hatchet, MD Referred by: Velna Hatchet, MD  Subjective: Chief Complaint  Patient presents with  . Lower Back - Pain  . Left Hip - Pain  . Left Leg - Pain   HPI:  [name] is a 57 y.o. female who comes in today For reevaluation and management of her chronic worsening severe pain in the lower back and left hip and groin.  I saw the patient in June of this year and completed diagnostic intra-articular hip anesthetic arthrogram and the patient got really good relief of her symptoms at that time.  She is followed by Dr. Marlou Sa in the office for her orthopedic care and through his work-up there was a question as to whether this was hip related or related to her lumbar spine.  She does have MRI which is reviewed below again with the patient and she has lumbar facet arthropathy at L4-5 and L5-S1 quite severe.  She has epidural lipomatosis at L4-5 with some significant stenosis at that area.  Evidently she called back into the office with same complaints and through Dr. Marlou Sa staff they ordered lumbar spine injection but in point of fact what really helped her was her intra-articular hip injection.  She reports the same symptoms today that she did in June.  Symptoms started about a month ago.  She got probably 5 months worth of relief or more.  Pain is in the lower back but radiates in the left hip and groin and anterior left leg pretty consistent with hip pathology.  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  She reports sitting and moving makes it worse getting in and out of the car makes it worse.  Pain medication and patch seem to help to some degree.  She uses some hydrocodone as well as Lidoderm patches and gabapentin through her primary care physician.  She has not had paresthesias or radicular pain.  She is a type II diabetic.  She has multiple medical issues  otherwise but complicates the big picture.  She denies any right-sided complaints and no new trauma.  Review of Systems  Constitutional: Negative for chills, fever, malaise/fatigue and weight loss.  HENT: Negative for hearing loss and sinus pain.   Eyes: Negative for blurred vision, double vision and photophobia.  Respiratory: Negative for cough and shortness of breath.   Cardiovascular: Negative for chest pain, palpitations and leg swelling.  Gastrointestinal: Negative for abdominal pain, nausea and vomiting.  Genitourinary: Negative for flank pain.  Musculoskeletal: Positive for back pain and joint pain. Negative for myalgias.  Skin: Negative for itching and rash.  Neurological: Negative for tremors, focal weakness and weakness.  Endo/Heme/Allergies: Negative.   Psychiatric/Behavioral: Negative for depression.  All other systems reviewed and are negative.  Otherwise per HPI.  Assessment & Plan: Visit Diagnoses:  1. Pain in left hip   2. Lumbar radiculopathy   3. Spinal stenosis of lumbar region without neurogenic claudication     Plan: Findings:  Chronic recalcitrant history of low back pain and left hip and thigh pain and groin pain.  She does have significant findings on imaging of the left hip as well as lumbar spine.  Her pain complaints are more consistent with hip pathology and hip related pain.  Diagnostic injection in June gave her quite a bit of relief for over 5 months.  I think the next step  is really repeat of the intra-articular hip injection this time hopefully just therapeutically.  Depending on relief and length of time of relief and have her follow-up again with Dr. Marlou Sa for her hip.  She probably likely has some back pain from her lumbar spine but clearly the biggest complaint and biggest relief so far was with a hip injection.  She will continue with current medications from a primary care physician.  She is morbidly obese and would benefit from weight loss.    Meds &  Orders:  Meds ordered this encounter  Medications  . DISCONTD: betamethasone acetate-betamethasone sodium phosphate (CELESTONE) injection 12 mg    Orders Placed This Encounter  Procedures  . Large Joint Inj: L hip joint  . XR C-ARM NO REPORT    Follow-up: Return if symptoms worsen or fail to improve.   Procedures: Large Joint Inj: L hip joint on 02/11/2018 2:26 PM Indications: diagnostic evaluation and pain Details: 22 G 3.5 in needle, fluoroscopy-guided anterior approach  Arthrogram: No  Medications: 80 mg triamcinolone acetonide 40 MG/ML; 3 mL bupivacaine 0.5 % Outcome: tolerated well, no immediate complications  There was excellent flow of contrast producing a partial arthrogram of the hip. The patient did have relief of symptoms during the anesthetic phase of the injection. Procedure, treatment alternatives, risks and benefits explained, specific risks discussed. Consent was given by the patient. Immediately prior to procedure a time out was called to verify the correct patient, procedure, equipment, support staff and site/side marked as required. Patient was prepped and draped in the usual sterile fashion.      No notes on file   Clinical History: MRI LUMBAR SPINE WITHOUT CONTRAST  TECHNIQUE: Multiplanar, multisequence MR imaging of the lumbar spine was performed. No intravenous contrast was administered.  COMPARISON:  Plain films lumbar spine 04/27/2014.  FINDINGS: Segmentation:  Standard.  Alignment: Facet degenerative disease results in 0.3 cm anterolisthesis L4 on L5 and 0.6 cm anterolisthesis L5 on S1. Convex left scoliosis is noted.  Vertebrae:  No fracture or worrisome marrow lesion.  Conus medullaris and cauda equina: Conus extends to the L2 level. Conus and cauda equina appear normal.  Paraspinal and other soft tissues: Negative.  Disc levels:  The T9-10, T10-11 and T11-12 are imaged in the sagittal plane only. The central canal and  foramina appear open at each level with mild disc bulging seen at T10-11 and T11-12.  T12-L1: Moderate facet arthropathy. Left paravertebral endplate spur. No central canal or foraminal stenosis.  L1-2: Moderate facet arthropathy.  Otherwise negative.  L2-3: Moderate facet arthropathy. Dorsal epidural fat is prominent. The central canal and foramina are open.  L3-4: Moderate facet arthropathy. Dorsal and left-side epidural fat is prominent. The central canal and right foramen are open. Mild left foraminal narrowing noted.  L4-5: The disc is uncovered with a shallow bulge. There is advanced bilateral facet arthropathy and bulky ligamentum flavum thickening. Epidural fat is somewhat prominent. Moderate to moderately severe compression of the thecal sac is identified. Mild to moderate foraminal narrowing is worse on the left.  L5-S1: Advanced bilateral facet degenerative disease. The disc is uncovered without bulging. The central canal and foramina are open.  IMPRESSION: Bulky ligamentum flavum thickening, shallow disc bulge and prominent epidural fat cause moderate to moderately severe compression of the thecal sac at L4-5. 0.3 cm facet mediated anterolisthesis is seen at this level.  Advanced facet arthropathy at L5-S1 results in 0.6 cm anterolisthesis. The central canal and foramina are open.  Electronically Signed   By: Inge Rise M.D.   On: 10/27/2017 13:35  Cervical spine MR 05/30/1014 There is reversal of the normal cervical lordosis with apex at C4-5. Trace anterolisthesis of C4 on C5 present. Overall, alignment is stable from previous MRI from 2014. Vertebral body heights are maintained. Signal intensity within the vertebral body bone marrow is normal. No focal osseous lesion  C4-5: Left eccentric circumferential degenerative disc osteophyte complex with left-sided uncovertebral spurring and mild left facet hypertrophy. There is resultant moderate  to severe left foraminal narrowing. Mild effacement of the left ventral thecal sac present without significant canal stenosis. Mild right foraminal narrowing present as well.  C5-6: Left eccentric degenerative disc osteophyte with uncovertebral hypertrophy and facet arthrosis. There is fairly severe left sided foraminal narrowing. Mild to moderate right foraminal stenosis present as well. No central canal stenosis.  C6-7: Mild diffuse degenerative disc osteophyte complex, a centric to the right, with right-sided uncovertebral spurring and hypertrophy. There is moderate to severe right foraminal stenosis with mild left foraminal narrowing. No significant central canal stenosis.  C7-T1: Mild left-sided facet hypertrophy present. No significant canal or foraminal stenosis.  Overall very similar findings to 2014     Objective:  VS:  HT:    WT:   BMI:     BP:(!) 149/84  HR:75bpm  TEMP:98.3 F (36.8 C)(Oral)  RESP:  Physical Exam  Constitutional: She is oriented to person, place, and time. She appears well-developed and well-nourished.  Obese  Eyes: Pupils are equal, round, and reactive to light. Conjunctivae and EOM are normal.  Cardiovascular: Normal rate and intact distal pulses.  Pulmonary/Chest: Effort normal.  Musculoskeletal:  Painful range of motion of the left hip with pain with internal rotation which is concordant with her pain.  No real tenderness over the greater trochanter and good distal strength bilaterally without clonus.  Patient does have some pain going from sit to stand as well.  Antalgic gait with ambulation.  Neurological: She is alert and oriented to person, place, and time. She exhibits normal muscle tone. Coordination normal.  Skin: Skin is warm and dry. No rash noted. No erythema.  Psychiatric: She has a normal mood and affect. Her behavior is normal.  Nursing note and vitals reviewed.   Ortho Exam Imaging: No results found.

## 2018-02-11 NOTE — Patient Instructions (Signed)

## 2018-02-13 NOTE — Progress Notes (Signed)
Office: [phone]  /  Fax: [phone]   HPI:   Chief Complaint: OBESITY Opha is here to discuss her progress with her obesity treatment plan. She is on the Category 2 plan and is following her eating plan approximately 80 % of the time. She states she is doing water aerobics 45 minutes 3 times per week. Zelena did very well with weight loss. She reports starting water aerobics. She reports eating more protein during her meals.  Her weight is 211 lb (95.7 kg) today and has had a weight loss of 7 pounds over a period of 2 weeks since her last visit. She has lost 14 lbs since starting treatment with [name].  Diabetes II Ayza has a diagnosis of diabetes type II. Ly states that her fasting BGs range between 89 and 122. After eating her readings are 96 to 161. She denies any hypoglycemic episodes. She is on Kombiglyze XR 5-500mg . Her last A1c was 8.8 on 11/20/17. She has been working on intensive lifestyle modifications including diet, exercise, and weight loss to help control her blood glucose levels.  ALLERGIES: Allergies  Allergen Reactions  . Penicillins Swelling    Has patient had a PCN reaction causing immediate rash, facial/tongue/throat swelling, SOB or lightheadedness with hypotension: YES Has patient had a PCN reaction causing severe rash involving mucus membranes or skin necrosis: NO Has patient had a PCN reaction that required hospitalization NO Has patient had a PCN reaction occurring within the last 10 years: NO If all of the above answers are "NO", then may proceed with Cephalosporin use.  . Latex Rash  . Sulfa Antibiotics Diarrhea, Itching and Rash    MEDICATIONS: Current Outpatient Medications on File Prior to Visit  Medication Sig Dispense Refill  . amLODipine (NORVASC) 5 MG tablet Take 5 mg by mouth daily.  6  . aspirin EC 81 MG tablet Take 81 mg by mouth daily.    [name] atorvastatin (LIPITOR) 40 MG tablet Take 40 mg by mouth at bedtime.     . carvedilol (COREG  CR) 10 MG 24 hr capsule Take 10 mg by mouth daily.    . cyclobenzaprine (FLEXERIL) 5 MG tablet Take 1 tablet (5 mg total) by mouth at bedtime. 15 tablet 0  . diclofenac sodium (VOLTAREN) 1 % GEL Apply 4 g topically 4 (four) times daily. 100 g 0  . furosemide (LASIX) 40 MG tablet Take 40 mg by mouth daily.    [name] gabapentin (NEURONTIN) 300 MG capsule Take 2 capsules (600 mg total) by mouth 3 (three) times daily. 180 capsule 2  . HYDROcodone-acetaminophen (NORCO/VICODIN) 5-325 MG tablet Take 1 tablet by mouth every 4 (four) hours as needed. 15 tablet 0  . isosorbide dinitrate (ISORDIL) 20 MG tablet Take 20 mg by mouth 2 (two) times daily.     [name] KLOR-CON M10 10 MEQ tablet Take 10 mEq by mouth 2 (two) times daily.     [name] KOMBIGLYZE XR 5-500 MG TB24 Take 1 tablet by mouth every morning.     . lidocaine (LIDODERM) 5 % Place 1 patch onto the skin daily. Remove & Discard patch within 12 hours or as directed by MD 15 patch 0  . lisinopril (PRINIVIL,ZESTRIL) 20 MG tablet Take 20 mg by mouth daily.     [name] loperamide (IMODIUM A-D) 2 MG tablet Take 2 mg by mouth 4 (four) times daily as needed for diarrhea or loose stools.    [name] MYRBETRIQ 25 MG TB24 tablet Take 25 mg by mouth  daily.  5  . omeprazole (PRILOSEC) 20 MG capsule Take 20 mg by mouth 2 (two) times daily.    . sertraline (ZOLOFT) 100 MG tablet Take 100 mg by mouth daily.     . Vitamin D, Ergocalciferol, (DRISDOL) 50000 units CAPS capsule Take 1 capsule (50,000 Units total) by mouth every 7 (seven) days. 4 capsule 0   No current facility-administered medications on file prior to visit.     PAST MEDICAL HISTORY: Past Medical History:  Diagnosis Date  . Anginal pain ([place])    a. NL cath in 2008;  b. Myoview 03/2011: dec uptake along mid anterior wall on stress imaging -> ? attenuation vs. ischemia, EF 65%;  c. Echo 04/2011: EF 55-60%, no RWMA, Gr 2 dd  . Anxiety   . Arthritis   . Asthma   . Back pain   . Bone cancer ([place])   . Cancer ([place])   . Chest  pain   . CHF (congestive heart failure) ([place])   . CHF (congestive heart failure) ([place])   . Depression   . Diabetes mellitus   . Drug use   . Dyspnea    with exertion  . Dyspnea   . Frequent urination   . GERD (gastroesophageal reflux disease)   . Glaucoma   . Headache   . HLD (hyperlipidemia)   . Hypertension   . IBS (irritable bowel syndrome)   . Joint pain   . Lactose intolerance   . Leg edema   . 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 Obesity   . Palpitations   . Pneumonia 05/2016   double  . Pulmonary edema   . Pulmonary embolism ([place])    a. 2008 -> coumadin x 6 mos.  . Rheumatoid arthritis ([place])   . Sleep apnea    does not use CPAP  . TIA (transient ischemic attack)   . Urinary urgency     PAST SURGICAL HISTORY: Past Surgical History:  Procedure Laterality Date  . ABDOMINAL HYSTERECTOMY  2005  . CARDIAC CATHETERIZATION     Normal  . CARDIAC CATHETERIZATION N/A 09/30/2014   Procedure: Left Heart Cath and Coronary Angiography;  Surgeon: Sherren Mocha, MD;  Location: [facility];  Service: Cardiovascular;  Laterality: N/A;  . LAPAROSCOPIC APPENDECTOMY N/A 06/03/2012   Procedure: APPENDECTOMY LAPAROSCOPIC;  Surgeon: Stark Klein, MD;  Location: [facility];  Service: General;  Laterality: N/A;  . Left knee surgery  2008  . LEG SURGERY    . TONSILLECTOMY    . TOTAL KNEE ARTHROPLASTY Left 08/22/2016   Procedure: TOTAL KNEE ARTHROPLASTY;  Surgeon: Meredith Pel, MD;  Location: [facility];  Service: Orthopedics;  Laterality: Left;  . TUBAL LIGATION  1989    SOCIAL HISTORY: Social History   Tobacco Use  . Smoking status: Former Smoker    Packs/day: 0.50    Years: 15.00    Pack years: 7.50    Types: Cigarettes    Last attempt to quit: 04/24/1985    Years since quitting: 32.8  . Smokeless tobacco: Never Used  Substance Use Topics  . Alcohol use: No    Alcohol/week: 0.0 standard drinks  . Drug use: Yes    Types: Marijuana    FAMILY  HISTORY: Family History  Problem Relation Age of Onset  . Emphysema Mother   . Arthritis Mother   . Heart failure Mother        alive @ 7  .  Stroke Mother   . Diabetes Mother   . Hypertension Mother   . Hyperlipidemia Mother   . Depression Mother   . Anxiety disorder Mother   . Asthma Brother   . Heart disease Father        died @ 50's.  . Stroke Father   . Diabetes Father   . Hyperlipidemia Father   . Hypertension Father   . Bipolar disorder Father   . Sleep apnea Father   . Alcoholism Father   . Drug abuse Father   . Diabetes Sister   . Heart disease Paternal Grandfather   . Colon cancer Maternal Grandfather   . Breast cancer Maternal Aunt     ROS: Review of Systems  Constitutional: Positive for weight loss.  Endo/Heme/Allergies:       Negative for hypoglycemia.    PHYSICAL EXAM: Blood pressure 131/83, pulse 70, temperature 97.9 F (36.6 C), temperature source Oral, height 4\' 10"  (1.473 m), weight 211 lb (95.7 kg), SpO2 98 %. Body mass index is 44.1 kg/m. Physical Exam  Constitutional: She is oriented to person, place, and time. She appears well-developed and well-nourished.  Cardiovascular: Normal rate.  Pulmonary/Chest: Effort normal.  Musculoskeletal: Normal range of motion.  Neurological: She is oriented to person, place, and time.  Skin: Skin is warm and dry.  Psychiatric: She has a normal mood and affect. Her behavior is normal.  Vitals reviewed.   RECENT LABS AND TESTS: BMET    Component Value Date/Time   NA 141 11/20/2017 0925   K 3.5 11/20/2017 0925   CL 98 11/20/2017 0925   CO2 26 11/20/2017 0925   GLUCOSE 158 (H) 11/20/2017 0925   GLUCOSE 152 (H) 07/27/2017 2248   BUN 9 11/20/2017 0925   CREATININE 0.86 11/20/2017 0925   CALCIUM 9.9 11/20/2017 0925   GFRNONAA 75 11/20/2017 0925   GFRAA 86 11/20/2017 0925   Lab Results  Component Value Date   HGBA1C 8.8 (H) 11/20/2017   HGBA1C 7.4 (H) 08/11/2016   HGBA1C 6.6 (H) 05/21/2016    HGBA1C 6.2 (H) 10/28/2012   HGBA1C 6.8 (H) 01/10/2012   Lab Results  Component Value Date   INSULIN 53.9 (H) 11/20/2017   CBC    Component Value Date/Time   WBC 6.5 11/20/2017 0925   WBC 9.5 07/27/2017 2248   RBC 4.78 11/20/2017 0925   RBC 4.87 07/27/2017 2248   HGB 12.8 11/20/2017 0925   HCT 40.6 11/20/2017 0925   PLT 200 07/27/2017 2248   MCV 85 11/20/2017 0925   MCH 26.8 11/20/2017 0925   MCH 28.1 07/27/2017 2248   MCHC 31.5 11/20/2017 0925   MCHC 33.6 07/27/2017 2248   RDW 13.9 11/20/2017 0925   LYMPHSABS 2.1 11/20/2017 0925   MONOABS 0.2 05/21/2016 0800   EOSABS 0.4 11/20/2017 0925   BASOSABS 0.0 11/20/2017 0925   Iron/TIBC/Ferritin/ %Sat No results found for: IRON, TIBC, FERRITIN, IRONPCTSAT Lipid Panel     Component Value Date/Time   CHOL 158 11/20/2017 0925   TRIG 228 (H) 11/20/2017 0925   HDL 53 11/20/2017 0925   CHOLHDL 4.2 04/23/2011 0830   CHOLHDL 4.3 04/23/2011 0830   VLDL 29 04/23/2011 0830   VLDL 29 04/23/2011 0830   LDLCALC 59 11/20/2017 0925   Hepatic Function Panel     Component Value Date/Time   PROT 7.0 11/20/2017 0925   ALBUMIN 4.3 11/20/2017 0925   AST 33 11/20/2017 0925   ALT 53 (H) 11/20/2017 0925   ALKPHOS 87 11/20/2017  0925   BILITOT 0.4 11/20/2017 0925   BILIDIR 0.1 05/22/2016 0913   IBILI 0.6 05/22/2016 0913      Component Value Date/Time   TSH 0.638 11/20/2017 0925   TSH 0.112 (L) 05/24/2016 0629   TSH 0.345 (L) 06/18/2014 0930   TSH 0.167 (L) 10/28/2012 0144   Results for NAVEYA, ELLERMAN (MRN [id number]) as of 02/13/2018 15:50  Ref. Range 11/20/2017 09:25  Vitamin D, 25-Hydroxy Latest Ref Range: 30.0 - 100.0 ng/mL 14.7 (L)   ASSESSMENT AND PLAN: Type 2 diabetes mellitus without complication, without long-term current use of insulin (HCC)  Class 3 severe obesity with serious comorbidity and body mass index (BMI) of 40.0 to 44.9 in adult, unspecified obesity type ([place])  PLAN:  Diabetes II Gladys has been given  extensive diabetes education by myself today including ideal fasting and post-prandial blood glucose readings, individual ideal Hgb A1c goals, and hypoglycemia prevention. We discussed the importance of good blood sugar control to decrease the likelihood of diabetic complications such as nephropathy, neuropathy, limb loss, blindness, coronary artery disease, and death. We discussed the importance of intensive lifestyle modification including diet, exercise and weight loss as the first line treatment for diabetes. Ivi agrees to continue her diabetes medications, diet, and exercise. She will follow up at the agreed upon time in 3 weeks.  Obesity Diamante is currently in the action stage of change. As such, her goal is to continue with weight loss efforts. She has agreed to follow the Category 2 plan. Evangelyne has been instructed to work up to a goal of 150 minutes of combined cardio and strengthening exercise per week for weight loss and overall health benefits. We discussed the following Behavioral Modification Strategies today: work on meal planning and easy cooking plans and keeping healthy foods in the home.  Laquasia has agreed to follow up with our clinic in 3 weeks. She was informed of the importance of frequent follow up visits to maximize her success with intensive lifestyle modifications for her multiple health conditions.   OBESITY BEHAVIORAL INTERVENTION VISIT  Today's visit was # 6   Starting weight: 225 lbs Starting date: 11/20/17 Today's weight : Weight: 211 lb (95.7 kg)  Today's date: 02/11/2018 Total lbs lost to date: 14 At least 15 minutes were spent on discussing the following behavioral intervention visit.  ASK: We discussed the diagnosis of obesity with Edwyna Ready today and Jazminn agreed to give [name] permission to discuss obesity behavioral modification therapy today.  ASSESS: Divine has the diagnosis of obesity and her BMI today is 44.11. Kayleann is in the action  stage of change.   ADVISE: Lanayah was educated on the multiple health risks of obesity as well as the benefit of weight loss to improve her health. She was advised of the need for long term treatment and the importance of lifestyle modifications to improve her current health and to decrease her risk of future health problems.  AGREE: Multiple dietary modification options and treatment options were discussed and Raiza agreed to follow the recommendations documented in the above note.  ARRANGE: Nasya was educated on the importance of frequent visits to treat obesity as outlined per CMS and USPSTF guidelines and agreed to schedule her next follow up appointment today.  Lenward Chancellor, am acting as transcriptionist for Abby Potash, PA-C I, Abby Potash, PA-C have reviewed above note and agree with its content

## 2018-02-20 ENCOUNTER — Encounter (INDEPENDENT_AMBULATORY_CARE_PROVIDER_SITE_OTHER): Payer: Self-pay | Admitting: Physical Medicine and Rehabilitation

## 2018-02-20 ENCOUNTER — Emergency Department (HOSPITAL_COMMUNITY): Payer: Medicare Other

## 2018-02-20 ENCOUNTER — Encounter (HOSPITAL_COMMUNITY): Payer: Self-pay

## 2018-02-20 ENCOUNTER — Emergency Department (HOSPITAL_COMMUNITY)
Admission: EM | Admit: 2018-02-20 | Discharge: 2018-02-20 | Disposition: A | Discharge status: Home / Self Care | Payer: Medicare Other | Attending: Emergency Medicine | Admitting: Emergency Medicine

## 2018-02-20 DIAGNOSIS — I110 Hypertensive heart disease with heart failure: Secondary | ICD-10-CM | POA: Insufficient documentation

## 2018-02-20 DIAGNOSIS — Z87891 Personal history of nicotine dependence: Secondary | ICD-10-CM | POA: Diagnosis not present

## 2018-02-20 DIAGNOSIS — M069 Rheumatoid arthritis, unspecified: Secondary | ICD-10-CM | POA: Diagnosis not present

## 2018-02-20 DIAGNOSIS — M545 Low back pain, unspecified: Secondary | ICD-10-CM

## 2018-02-20 DIAGNOSIS — G8929 Other chronic pain: Secondary | ICD-10-CM | POA: Diagnosis not present

## 2018-02-20 DIAGNOSIS — G4709 Other insomnia: Secondary | ICD-10-CM

## 2018-02-20 DIAGNOSIS — E119 Type 2 diabetes mellitus without complications: Secondary | ICD-10-CM | POA: Insufficient documentation

## 2018-02-20 DIAGNOSIS — Z8673 Personal history of transient ischemic attack (TIA), and cerebral infarction without residual deficits: Secondary | ICD-10-CM | POA: Insufficient documentation

## 2018-02-20 DIAGNOSIS — I509 Heart failure, unspecified: Secondary | ICD-10-CM | POA: Insufficient documentation

## 2018-02-20 DIAGNOSIS — Z96652 Presence of left artificial knee joint: Secondary | ICD-10-CM | POA: Insufficient documentation

## 2018-02-20 DIAGNOSIS — Z85830 Personal history of malignant neoplasm of bone: Secondary | ICD-10-CM | POA: Insufficient documentation

## 2018-02-20 DIAGNOSIS — Z7982 Long term (current) use of aspirin: Secondary | ICD-10-CM | POA: Insufficient documentation

## 2018-02-20 DIAGNOSIS — Z79899 Other long term (current) drug therapy: Secondary | ICD-10-CM | POA: Insufficient documentation

## 2018-02-20 DIAGNOSIS — W19XXXA Unspecified fall, initial encounter: Secondary | ICD-10-CM

## 2018-02-20 LAB — URINALYSIS, ROUTINE W REFLEX MICROSCOPIC
Bilirubin Urine: NEGATIVE
Glucose, UA: NEGATIVE mg/dL
Hgb urine dipstick: NEGATIVE
Ketones, ur: NEGATIVE mg/dL
Leukocytes, UA: NEGATIVE
Nitrite: NEGATIVE
Protein, ur: NEGATIVE mg/dL
Specific Gravity, Urine: 1.015 (ref 1.005–1.030)
pH: 5 (ref 5.0–8.0)

## 2018-02-20 MED ORDER — TRIAMCINOLONE ACETONIDE 40 MG/ML IJ SUSP
80.0000 mg | INTRAMUSCULAR | Status: AC | PRN
  Administered 2018-02-11: 80 mg via INTRA_ARTICULAR

## 2018-02-20 MED ORDER — CYCLOBENZAPRINE HCL 5 MG PO TABS: 5.0000 mg | ORAL_TABLET | Freq: Every day | ORAL | 0 refills | Status: DC

## 2018-02-20 MED ORDER — DEXAMETHASONE 4 MG PO TABS
10.0000 mg | ORAL_TABLET | Freq: Once | ORAL | Status: AC
  Administered 2018-02-20: 10 mg via ORAL
  Filled 2018-02-20: qty 3

## 2018-02-20 MED ORDER — BUPIVACAINE HCL 0.5 % IJ SOLN
3.0000 mL | INTRAMUSCULAR | Status: AC | PRN
  Administered 2018-02-11: 3 mL via INTRA_ARTICULAR

## 2018-02-20 MED ORDER — DICLOFENAC SODIUM 1 % TD GEL: 4.0000 g | Freq: Four times a day (QID) | TRANSDERMAL | 0 refills | Status: DC

## 2018-02-20 MED ORDER — MORPHINE SULFATE (PF) 4 MG/ML IV SOLN
4.0000 mg | Freq: Once | INTRAVENOUS | Status: AC
  Administered 2018-02-20: 4 mg via INTRAMUSCULAR
  Filled 2018-02-20: qty 1
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
## 2018-02-20 NOTE — ED Notes (Signed)
Patient transported to X-ray 

## 2018-02-20 NOTE — Discharge Instructions (Signed)
Your xrays did not show evidence of fracture.  Your urine did not show signs of infection.  Please follow up with your orthopedist this week.  Please monitor your blood sugar at home as you have received a steroid here in the department. This may cause in increase in your blood sugar levels. Return if they are over 300.  Please continue using your Flexeril - Muscle relaxants:  These medications can help with muscle tightness that is a cause of lower back pain.  Most of these medications can cause drowsiness, and it is not safe to drive or use dangerous machinery while taking them. They are primarily helpful when taken at night before sleep. Use Voltaren gel for pain relief.  Follow attached handouts.  Return if you lose control of your bowel or bladder (urinate or defecate on yourself), develop any new numbness/weakness of the extremity, inability to urinate, fever, abdominal pain, difficulty walking or if you develop worsening or new concerning symptoms you can return to the emergency department for re-evaluation.
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
## 2018-03-04 ENCOUNTER — Encounter (INDEPENDENT_AMBULATORY_CARE_PROVIDER_SITE_OTHER): Payer: Self-pay | Admitting: Physician Assistant

## 2018-03-04 ENCOUNTER — Ambulatory Visit (INDEPENDENT_AMBULATORY_CARE_PROVIDER_SITE_OTHER): Payer: Medicare Other | Admitting: Physician Assistant

## 2018-03-04 VITALS — BP 113/67 | HR 75 | Temp 98.2°F | Ht <= 58 in | Wt 207.0 lb

## 2018-03-04 DIAGNOSIS — E119 Type 2 diabetes mellitus without complications: Secondary | ICD-10-CM

## 2018-03-04 DIAGNOSIS — Z6841 Body Mass Index (BMI) 40.0 and over, adult: Secondary | ICD-10-CM | POA: Diagnosis not present

## 2018-03-04 DIAGNOSIS — E7849 Other hyperlipidemia: Secondary | ICD-10-CM

## 2018-03-04 DIAGNOSIS — E559 Vitamin D deficiency, unspecified: Secondary | ICD-10-CM | POA: Diagnosis not present

## 2018-03-05 ENCOUNTER — Encounter: Payer: Self-pay | Admitting: Adult Health

## 2018-03-05 LAB — COMPREHENSIVE METABOLIC PANEL
ALT: 40 IU/L — ABNORMAL HIGH (ref 0–32)
AST: 19 IU/L (ref 0–40)
Albumin/Globulin Ratio: 1.8 (ref 1.2–2.2)
Albumin: 4.1 g/dL (ref 3.5–5.5)
Alkaline Phosphatase: 61 IU/L (ref 39–117)
BUN/Creatinine Ratio: 19 (ref 9–23)
BUN: 17 mg/dL (ref 6–24)
Bilirubin Total: 0.4 mg/dL (ref 0.0–1.2)
CO2: 25 mmol/L (ref 20–29)
Calcium: 9.5 mg/dL (ref 8.7–10.2)
Chloride: 101 mmol/L (ref 96–106)
Creatinine, Ser: 0.91 mg/dL (ref 0.57–1.00)
GFR calc Af Amer: 80 mL/min/{1.73_m2} (ref >59)
GFR calc non Af Amer: 70 mL/min/{1.73_m2} (ref >59)
Globulin, Total: 2.3 g/dL (ref 1.5–4.5)
Glucose: 123 mg/dL — ABNORMAL HIGH (ref 65–99)
Potassium: 3.8 mmol/L (ref 3.5–5.2)
Sodium: 144 mmol/L (ref 134–144)
Total Protein: 6.4 g/dL (ref 6.0–8.5)

## 2018-03-05 LAB — INSULIN, RANDOM: INSULIN: 20.2 u[IU]/mL (ref 2.6–24.9)

## 2018-03-05 LAB — HEMOGLOBIN A1C
Est. average glucose Bld gHb Est-mCnc: 148 mg/dL
Hgb A1c MFr Bld: 6.8 % — ABNORMAL HIGH (ref 4.8–5.6)

## 2018-03-05 LAB — LIPID PANEL WITH LDL/HDL RATIO
Cholesterol, Total: 162 mg/dL (ref 100–199)
HDL: 50 mg/dL (ref >39)
LDL Calculated: 89 mg/dL (ref 0–99)
LDl/HDL Ratio: 1.8 ratio (ref 0.0–3.2)
Triglycerides: 117 mg/dL (ref 0–149)
VLDL Cholesterol Cal: 23 mg/dL (ref 5–40)

## 2018-03-05 LAB — VITAMIN D 25 HYDROXY (VIT D DEFICIENCY, FRACTURES): Vit D, 25-Hydroxy: 34.5 ng/mL (ref 30.0–100.0)

## 2018-03-06 ENCOUNTER — Ambulatory Visit (INDEPENDENT_AMBULATORY_CARE_PROVIDER_SITE_OTHER): Payer: Medicare Other | Admitting: Orthopedic Surgery

## 2018-03-06 ENCOUNTER — Encounter (INDEPENDENT_AMBULATORY_CARE_PROVIDER_SITE_OTHER): Payer: Self-pay | Admitting: Orthopedic Surgery

## 2018-03-06 DIAGNOSIS — M5442 Lumbago with sciatica, left side: Secondary | ICD-10-CM | POA: Diagnosis not present

## 2018-03-06 DIAGNOSIS — G8929 Other chronic pain: Secondary | ICD-10-CM | POA: Diagnosis not present

## 2018-03-06 MED ORDER — MELOXICAM 15 MG PO TABS: ORAL_TABLET | ORAL | 0 refills | Status: DC

## 2018-03-06 MED ORDER — LIDOCAINE 5 % EX PTCH: 1.0000 | MEDICATED_PATCH | CUTANEOUS | 0 refills | Status: DC

## 2018-03-06 NOTE — Progress Notes (Signed)
Office: [phone]  /  Fax: [phone]   HPI:   Chief Complaint: OBESITY [name] is here to discuss her progress with her obesity treatment plan. She is on the Category 2 plan and is following her eating plan approximately 90 % of the time. She states she is walking 20 minutes 5 times per week. [name] did very well with weight loss. She reports getting more protein in during her meals. She also states that there are times that she wakes up late and skips breakfast.  Her weight is 207 lb (93.9 kg) today and has had a weight loss of 4 pounds over a period of 3 weeks since her last visit. She has lost 18 lbs since starting treatment with [name].  Diabetes II [name] has a diagnosis of diabetes type II. [name] states that her fasting BGs range between 87 and 120 and she is taking Kombiglyze 5-500mg . She denies any hypoglycemic episodes or polyphagia. Her last A1c was 6.8 on 03/04/18. She has been working on intensive lifestyle modifications including diet, exercise, and weight loss to help control her blood glucose levels.  Hyperlipidemia [name] has hyperlipidemia and has been trying to improve her cholesterol levels with intensive lifestyle modification including a low saturated fat diet, exercise and weight loss. She is taking atorvastatin 40mg  and she denies any chest pain.  Vitamin D deficiency [name] has a diagnosis of vitamin D deficiency. She is currently taking prescription vit D and denies nausea, vomiting, or muscle weakness.  ALLERGIES: Allergies  Allergen Reactions  . Penicillins Swelling    Has patient had a PCN reaction causing immediate rash, facial/tongue/throat swelling, SOB or lightheadedness with hypotension: YES Has patient had a PCN reaction causing severe rash involving mucus membranes or skin necrosis: NO Has patient had a PCN reaction that required hospitalization NO Has patient had a PCN reaction occurring within the last 10 years: NO If all of the above answers are  "NO", then may proceed with Cephalosporin use.  . Latex Rash  . Sulfa Antibiotics Diarrhea, Itching and Rash    MEDICATIONS: Current Outpatient Medications on File Prior to Visit  Medication Sig Dispense Refill  . amLODipine (NORVASC) 5 MG tablet Take 5 mg by mouth daily.  6  . aspirin EC 81 MG tablet Take 81 mg by mouth daily.    [name] atorvastatin (LIPITOR) 40 MG tablet Take 40 mg by mouth at bedtime.     . carvedilol (COREG CR) 10 MG 24 hr capsule Take 10 mg by mouth daily.    . cyclobenzaprine (FLEXERIL) 5 MG tablet Take 1 tablet (5 mg total) by mouth at bedtime. 15 tablet 0  . diclofenac sodium (VOLTAREN) 1 % GEL Apply 4 g topically 4 (four) times daily. 100 g 0  . furosemide (LASIX) 40 MG tablet Take 40 mg by mouth daily.    [name] gabapentin (NEURONTIN) 300 MG capsule Take 2 capsules (600 mg total) by mouth 3 (three) times daily. 180 capsule 2  . HYDROcodone-acetaminophen (NORCO/VICODIN) 5-325 MG tablet Take 1 tablet by mouth every 4 (four) hours as needed. 15 tablet 0  . isosorbide dinitrate (ISORDIL) 20 MG tablet Take 20 mg by mouth 2 (two) times daily.     [name] KLOR-CON M10 10 MEQ tablet Take 10 mEq by mouth 2 (two) times daily.     [name] KOMBIGLYZE XR 5-500 MG TB24 Take 1 tablet by mouth every morning.     . lidocaine (LIDODERM) 5 % Place 1 patch onto the skin daily. Remove &  Discard patch within 12 hours or as directed by MD 15 patch 0  . lisinopril (PRINIVIL,ZESTRIL) 20 MG tablet Take 20 mg by mouth daily.     [name] loperamide (IMODIUM A-D) 2 MG tablet Take 2 mg by mouth 4 (four) times daily as needed for diarrhea or loose stools.    [name] MYRBETRIQ 25 MG TB24 tablet Take 25 mg by mouth daily.  5  . omeprazole (PRILOSEC) 20 MG capsule Take 20 mg by mouth 2 (two) times daily.    . sertraline (ZOLOFT) 100 MG tablet Take 100 mg by mouth daily.     . Vitamin D, Ergocalciferol, (DRISDOL) 50000 units CAPS capsule Take 1 capsule (50,000 Units total) by mouth every 7 (seven) days. 4 capsule 0   No current  facility-administered medications on file prior to visit.     PAST MEDICAL HISTORY: Past Medical History:  Diagnosis Date  . Anginal pain ([place])    a. NL cath in 2008;  b. Myoview 03/2011: dec uptake along mid anterior wall on stress imaging -> ? attenuation vs. ischemia, EF 65%;  c. Echo 04/2011: EF 55-60%, no RWMA, Gr 2 dd  . Anxiety   . Arthritis   . Asthma   . Back pain   . Bone cancer ([place])   . Cancer ([place])   . Chest pain   . CHF (congestive heart failure) ([place])   . CHF (congestive heart failure) ([place])   . Depression   . Diabetes mellitus   . Drug use   . Dyspnea    with exertion  . Dyspnea   . Frequent urination   . GERD (gastroesophageal reflux disease)   . Glaucoma   . Headache   . HLD (hyperlipidemia)   . Hypertension   . IBS (irritable bowel syndrome)   . Joint pain   . Lactose intolerance   . Leg edema   . 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 Obesity   . Palpitations   . Pneumonia 05/2016   double  . Pulmonary edema   . Pulmonary embolism ([place])    a. 2008 -> coumadin x 6 mos.  . Rheumatoid arthritis ([place])   . Sleep apnea    does not use CPAP  . TIA (transient ischemic attack)   . Urinary urgency     PAST SURGICAL HISTORY: Past Surgical History:  Procedure Laterality Date  . ABDOMINAL HYSTERECTOMY  2005  . CARDIAC CATHETERIZATION     Normal  . CARDIAC CATHETERIZATION N/A 09/30/2014   Procedure: Left Heart Cath and Coronary Angiography;  Surgeon: Sherren Mocha, MD;  Location: [facility];  Service: Cardiovascular;  Laterality: N/A;  . LAPAROSCOPIC APPENDECTOMY N/A 06/03/2012   Procedure: APPENDECTOMY LAPAROSCOPIC;  Surgeon: Stark Klein, MD;  Location: [facility];  Service: General;  Laterality: N/A;  . Left knee surgery  2008  . LEG SURGERY    . TONSILLECTOMY    . TOTAL KNEE ARTHROPLASTY Left 08/22/2016   Procedure: TOTAL KNEE ARTHROPLASTY;  Surgeon: Meredith Pel, MD;  Location: [facility];  Service: Orthopedics;  Laterality:  Left;  . TUBAL LIGATION  1989    SOCIAL HISTORY: Social History   Tobacco Use  . Smoking status: Former Smoker    Packs/day: 0.50    Years: 15.00    Pack years: 7.50    Types: Cigarettes    Last attempt to quit: 04/24/1985    Years since quitting: 32.8  . Smokeless tobacco: Never  Used  Substance Use Topics  . Alcohol use: No    Alcohol/week: 0.0 standard drinks  . Drug use: Yes    Types: Marijuana    FAMILY HISTORY: Family History  Problem Relation Age of Onset  . Emphysema Mother   . Arthritis Mother   . Heart failure Mother        alive @ 31  . Stroke Mother   . Diabetes Mother   . Hypertension Mother   . Hyperlipidemia Mother   . Depression Mother   . Anxiety disorder Mother   . Asthma Brother   . Heart disease Father        died @ 22's.  . Stroke Father   . Diabetes Father   . Hyperlipidemia Father   . Hypertension Father   . Bipolar disorder Father   . Sleep apnea Father   . Alcoholism Father   . Drug abuse Father   . Diabetes Sister   . Heart disease Paternal Grandfather   . Colon cancer Maternal Grandfather   . Breast cancer Maternal Aunt     ROS: Review of Systems  Constitutional: Positive for weight loss.  Cardiovascular: Negative for chest pain.  Gastrointestinal: Negative for diarrhea, nausea and vomiting.  Musculoskeletal:       Negative for muscle weakness.  Endo/Heme/Allergies:       Negative for hypoglycemia. Negative for polyphagia.     PHYSICAL EXAM: Blood pressure 113/67, pulse 75, temperature 98.2 F (36.8 C), temperature source Oral, height 4\' 10"  (1.473 m), weight 207 lb (93.9 kg), SpO2 96 %. Body mass index is 43.26 kg/m. Physical Exam  Constitutional: She is oriented to person, place, and time. She appears well-developed and well-nourished.  Cardiovascular: Normal rate.  Pulmonary/Chest: Effort normal.  Musculoskeletal: Normal range of motion.  Neurological: She is oriented to person, place, and time.  Skin: Skin is  warm and dry.  Psychiatric: She has a normal mood and affect. Her behavior is normal.  Vitals reviewed.   RECENT LABS AND TESTS: BMET    Component Value Date/Time   NA 144 03/04/2018 1152   K 3.8 03/04/2018 1152   CL 101 03/04/2018 1152   CO2 25 03/04/2018 1152   GLUCOSE 123 (H) 03/04/2018 1152   GLUCOSE 152 (H) 07/27/2017 2248   BUN 17 03/04/2018 1152   CREATININE 0.91 03/04/2018 1152   CALCIUM 9.5 03/04/2018 1152   GFRNONAA 70 03/04/2018 1152   GFRAA 80 03/04/2018 1152   Lab Results  Component Value Date   HGBA1C 6.8 (H) 03/04/2018   HGBA1C 8.8 (H) 11/20/2017   HGBA1C 7.4 (H) 08/11/2016   HGBA1C 6.6 (H) 05/21/2016   HGBA1C 6.2 (H) 10/28/2012   Lab Results  Component Value Date   INSULIN 20.2 03/04/2018   INSULIN 53.9 (H) 11/20/2017   CBC    Component Value Date/Time   WBC 6.5 11/20/2017 0925   WBC 9.5 07/27/2017 2248   RBC 4.78 11/20/2017 0925   RBC 4.87 07/27/2017 2248   HGB 12.8 11/20/2017 0925   HCT 40.6 11/20/2017 0925   PLT 200 07/27/2017 2248   MCV 85 11/20/2017 0925   MCH 26.8 11/20/2017 0925   MCH 28.1 07/27/2017 2248   MCHC 31.5 11/20/2017 0925   MCHC 33.6 07/27/2017 2248   RDW 13.9 11/20/2017 0925   LYMPHSABS 2.1 11/20/2017 0925   MONOABS 0.2 05/21/2016 0800   EOSABS 0.4 11/20/2017 0925   BASOSABS 0.0 11/20/2017 0925   Iron/TIBC/Ferritin/ %Sat No results found for: IRON, TIBC,  FERRITIN, IRONPCTSAT Lipid Panel     Component Value Date/Time   CHOL 162 03/04/2018 1152   TRIG 117 03/04/2018 1152   HDL 50 03/04/2018 1152   CHOLHDL 4.2 04/23/2011 0830   CHOLHDL 4.3 04/23/2011 0830   VLDL 29 04/23/2011 0830   VLDL 29 04/23/2011 0830   LDLCALC 89 03/04/2018 1152   Hepatic Function Panel     Component Value Date/Time   PROT 6.4 03/04/2018 1152   ALBUMIN 4.1 03/04/2018 1152   AST 19 03/04/2018 1152   ALT 40 (H) 03/04/2018 1152   ALKPHOS 61 03/04/2018 1152   BILITOT 0.4 03/04/2018 1152   BILIDIR 0.1 05/22/2016 0913   IBILI 0.6 05/22/2016  0913      Component Value Date/Time   TSH 0.638 11/20/2017 0925   TSH 0.112 (L) 05/24/2016 0629   TSH 0.345 (L) 06/18/2014 0930   TSH 0.167 (L) 10/28/2012 0144   Results for AYDA, TANCREDI (MRN [id number]) as of 03/06/2018 07:22  Ref. Range 03/04/2018 11:52  Vitamin D, 25-Hydroxy Latest Ref Range: 30.0 - 100.0 ng/mL 34.5   ASSESSMENT AND PLAN: Type 2 diabetes mellitus without complication, without long-term current use of insulin (HCC) - Plan: Hemoglobin A1c, Insulin, random, Comprehensive metabolic panel  Other hyperlipidemia - Plan: Lipid Panel With LDL/HDL Ratio  Vitamin D deficiency - Plan: VITAMIN D 25 Hydroxy (Vit-D Deficiency, Fractures)  Class 3 severe obesity with serious comorbidity and body mass index (BMI) of 40.0 to 44.9 in adult, unspecified obesity type ([place])  PLAN:  Diabetes II [name] has been given extensive diabetes education by myself today including ideal fasting and post-prandial blood glucose readings, individual ideal Hgb A1c goals, and hypoglycemia prevention. We discussed the importance of good blood sugar control to decrease the likelihood of diabetic complications such as nephropathy, neuropathy, limb loss, blindness, coronary artery disease, and death. We discussed the importance of intensive lifestyle modification including diet, exercise and weight loss as the first line treatment for diabetes. [name] agrees to continue her diabetes medications and weight loss. We will check labs today and she agrees to  follow up at the agreed upon time in 3 weeks.  Hyperlipidemia [name] was informed of the American Heart Association Guidelines emphasizing intensive lifestyle modifications as the first line treatment for hyperlipidemia. We discussed many lifestyle modifications today in depth, and [name] will continue to work on decreasing saturated fats such as fatty red meat, butter and many fried foods. She will also increase vegetables and lean protein in her diet  and continue to work on exercise and weight loss efforts. [name] agrees to continue the atorvastatin, weight loss, and we will check labs today. [name] agrees to follow up as agreed.  Vitamin D Deficiency [name] was informed that low vitamin D levels contributes to fatigue and are associated with obesity, breast, and colon cancer. She agrees to continue to take prescription Vit D @50 ,000 IU every week #4 with no refills and will follow up for routine testing of vitamin D, at least 2-3 times per year. She was informed of the risk of over-replacement of vitamin D and agrees to not increase her dose unless she discusses this with [name] first. [name] will have labs drawn today and will follow up as directed in 3 weeks.  Obesity [name] is currently in the action stage of change. As such, her goal is to continue with weight loss efforts. She has agreed to follow the Category 2 plan. [name] has been instructed to work up to a goal  of 150 minutes of combined cardio and strengthening exercise per week for weight loss and overall health benefits. We discussed the following Behavioral Modification Strategies today: increasing lean protein intake and work on meal planning and easy cooking plans.  [name] has agreed to follow up with our clinic in 3 weeks. She was informed of the importance of frequent follow up visits to maximize her success with intensive lifestyle modifications for her multiple health conditions.   OBESITY BEHAVIORAL INTERVENTION VISIT  Today's visit was # 6   Starting weight: 225 lbs Starting date: 11/20/17 Today's weight : Weight: 207 lb (93.9 kg)  Today's date: 03/04/2018 Total lbs lost to date: 18 At least 15 minutes were spent on discussing the following behavioral intervention visit.  ASK: We discussed the diagnosis of obesity with [name] today and [name] agreed to give [name] permission to discuss obesity behavioral modification therapy today.  ASSESS: [name] has the  diagnosis of obesity and her BMI today is 43.27. [name] is in the action stage of change.   ADVISE: [name] was educated on the multiple health risks of obesity as well as the benefit of weight loss to improve her health. She was advised of the need for long term treatment and the importance of lifestyle modifications to improve her current health and to decrease her risk of future health problems.  AGREE: Multiple dietary modification options and treatment options were discussed and [name] agreed to follow the recommendations documented in the above note.  ARRANGE: [name] was educated on the importance of frequent visits to treat obesity as outlined per CMS and USPSTF guidelines and agreed to schedule her next follow up appointment today.  Lenward Chancellor, am acting as transcriptionist for Abby Potash, PA-C I, Abby Potash, PA-C have reviewed above note and agree with its content

## 2018-03-07 ENCOUNTER — Ambulatory Visit (INDEPENDENT_AMBULATORY_CARE_PROVIDER_SITE_OTHER): Payer: Medicare Other | Admitting: Adult Health

## 2018-03-07 ENCOUNTER — Encounter: Payer: Self-pay | Admitting: Adult Health

## 2018-03-07 VITALS — BP 127/75 | HR 76 | Ht <= 58 in | Wt 213.2 lb

## 2018-03-07 DIAGNOSIS — G4733 Obstructive sleep apnea (adult) (pediatric): Secondary | ICD-10-CM | POA: Diagnosis not present

## 2018-03-07 DIAGNOSIS — Z9989 Dependence on other enabling machines and devices: Secondary | ICD-10-CM | POA: Diagnosis not present

## 2018-03-07 NOTE — Patient Instructions (Signed)
Your Plan:  Continue using CPAP nightly and greater than 4 hours each night If your symptoms worsen or you develop new symptoms please let us know.       Thank you for coming to see us at [facility]. I hope we have been able to provide you high quality care today.  You may receive a patient satisfaction survey over the next few weeks. We would appreciate your feedback and comments so that we may continue to improve ourselves and the health of our patients.  

## 2018-03-07 NOTE — Progress Notes (Signed)
PATIENT: [name] DOB: August 11, 1960  REASON FOR VISIT: follow up HISTORY FROM: patient  HISTORY OF PRESENT ILLNESS: Today 03/07/18:  [name] is a 57 year old female with a history of obstructive sleep apnea on CPAP.  She returns today for follow-up.  Her CPAP download indicates that she use her machine 9 out of 90 days for compliance of 77%.  She used her machine greater than 4 hours 63 days for compliance of 70%.  On average she uses her machine 6 hours and 30 minutes.  Her residual AHI is 0.9 on 16 cmH2O with EPR of 3.  She states that the CPAP is working well for her.  She states that she does need to change out her supplies.  She reports that she has been going to [facility] weight [facility].  Reports that she has lost approximately 18 pounds.  She returns today for evaluation.  HISTORY 09/04/17 [name] is a 57 year old female with a history of obstructive sleep.  She returns today for follow-up.  Her download indicates that she use her machine 51 out of 90 days for compliance of 57%.  She use her machine greater than 4 hours 39 out of 51 days for compliance of 43%.  On average she uses her machine 5 hours and 51 minutes.  Her residual AHI is 0.9 on 16 cm of water with EPR 3.  Her leak in the 95th percentile is 29.4 L/min.  She states that some nights she falls asleep before putting her machine on.  She states other nights she wakes up and she has taken the mask off during the night.  She states that she is also struggling with weight loss.  She states that her weight continues to fluctuate.  Her Epworth sleepiness score is 10.  She returns today for evaluation.   REVIEW OF SYSTEMS: Out of a complete 14 system review of symptoms, the patient complains only of the following symptoms, and all other reviewed systems are negative  Apnea, back pain, aching muscles, muscle cramps, neck pain, itching, depression  ALLERGIES: Allergies  Allergen Reactions  .  Penicillins Swelling    Has patient had a PCN reaction causing immediate rash, facial/tongue/throat swelling, SOB or lightheadedness with hypotension: YES Has patient had a PCN reaction causing severe rash involving mucus membranes or skin necrosis: NO Has patient had a PCN reaction that required hospitalization NO Has patient had a PCN reaction occurring within the last 10 years: NO If all of the above answers are "NO", then may proceed with Cephalosporin use.  . Latex Rash  . Sulfa Antibiotics Diarrhea, Itching and Rash    HOME MEDICATIONS: Outpatient Medications Prior to Visit  Medication Sig Dispense Refill  . amLODipine (NORVASC) 5 MG tablet Take 5 mg by mouth daily.  6  . aspirin EC 81 MG tablet Take 81 mg by mouth daily.    [name] atorvastatin (LIPITOR) 40 MG tablet Take 40 mg by mouth at bedtime.     . carvedilol (COREG CR) 10 MG 24 hr capsule Take 10 mg by mouth daily.    . cyclobenzaprine (FLEXERIL) 5 MG tablet Take 1 tablet (5 mg total) by mouth at bedtime. 15 tablet 0  . diclofenac sodium (VOLTAREN) 1 % GEL Apply 4 g topically 4 (four) times daily. 100 g 0  . furosemide (LASIX) 40 MG tablet Take 40 mg by mouth daily.    [name] gabapentin (NEURONTIN) 300 MG capsule Take 2 capsules (600 mg total)  by mouth 3 (three) times daily. 180 capsule 2  . HYDROcodone-acetaminophen (NORCO/VICODIN) 5-325 MG tablet Take 1 tablet by mouth every 4 (four) hours as needed. 15 tablet 0  . isosorbide dinitrate (ISORDIL) 20 MG tablet Take 20 mg by mouth 2 (two) times daily.     [name] KLOR-CON M10 10 MEQ tablet Take 10 mEq by mouth 2 (two) times daily.     [name] KOMBIGLYZE XR 5-500 MG TB24 Take 1 tablet by mouth every morning.     . lidocaine (LIDODERM) 5 % Place 1 patch onto the skin daily. Remove & Discard patch within 12 hours or as directed by MD 15 patch 0  . lidocaine (LIDODERM) 5 % Place 1 patch onto the skin daily. Remove & Discard patch within 12 hours or as directed by MD 30 patch 0  . lisinopril  (PRINIVIL,ZESTRIL) 20 MG tablet Take 20 mg by mouth daily.     [name] loperamide (IMODIUM A-D) 2 MG tablet Take 2 mg by mouth 4 (four) times daily as needed for diarrhea or loose stools.    . meloxicam (MOBIC) 15 MG tablet 1 po q d x 2 wekks 30 tablet 0  . MYRBETRIQ 25 MG TB24 tablet Take 25 mg by mouth daily.  5  . omeprazole (PRILOSEC) 20 MG capsule Take 20 mg by mouth 2 (two) times daily.    . sertraline (ZOLOFT) 100 MG tablet Take 100 mg by mouth daily.     . Vitamin D, Ergocalciferol, (DRISDOL) 50000 units CAPS capsule Take 1 capsule (50,000 Units total) by mouth every 7 (seven) days. 4 capsule 0   No facility-administered medications prior to visit.     PAST MEDICAL HISTORY: Past Medical History:  Diagnosis Date  . Anginal pain ([place])    a. NL cath in 2008;  b. Myoview 03/2011: dec uptake along mid anterior wall on stress imaging -> ? attenuation vs. ischemia, EF 65%;  c. Echo 04/2011: EF 55-60%, no RWMA, Gr 2 dd  . Anxiety   . Arthritis   . Asthma   . Back pain   . Bone cancer ([place])   . Cancer ([place])   . Chest pain   . CHF (congestive heart failure) ([place])   . CHF (congestive heart failure) ([place])   . Depression   . Diabetes mellitus   . Drug use   . Dyspnea    with exertion  . Dyspnea   . Frequent urination   . GERD (gastroesophageal reflux disease)   . Glaucoma   . Headache   . HLD (hyperlipidemia)   . Hypertension   . IBS (irritable bowel syndrome)   . Joint pain   . Lactose intolerance   . Leg edema   . 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 Obesity   . Palpitations   . Pneumonia 05/2016   double  . Pulmonary edema   . Pulmonary embolism ([place])    a. 2008 -> coumadin x 6 mos.  . Rheumatoid arthritis ([place])   . Sleep apnea    does not use CPAP  . TIA (transient ischemic attack)   . Urinary urgency     PAST SURGICAL HISTORY: Past Surgical History:  Procedure Laterality Date  . ABDOMINAL HYSTERECTOMY  2005  . CARDIAC CATHETERIZATION      Normal  . CARDIAC CATHETERIZATION N/A 09/30/2014   Procedure: Left Heart Cath and Coronary Angiography;  Surgeon: Sherren Mocha, MD;  Location: [facility];  Service: Cardiovascular;  Laterality: N/A;  . LAPAROSCOPIC APPENDECTOMY N/A 06/03/2012   Procedure: APPENDECTOMY LAPAROSCOPIC;  Surgeon: Stark Klein, MD;  Location: [facility];  Service: General;  Laterality: N/A;  . Left knee surgery  2008  . LEG SURGERY    . TONSILLECTOMY    . TOTAL KNEE ARTHROPLASTY Left 08/22/2016   Procedure: TOTAL KNEE ARTHROPLASTY;  Surgeon: Meredith Pel, MD;  Location: [facility];  Service: Orthopedics;  Laterality: Left;  . TUBAL LIGATION  1989    FAMILY HISTORY: Family History  Problem Relation Age of Onset  . Emphysema Mother   . Arthritis Mother   . Heart failure Mother        alive @ 60  . Stroke Mother   . Diabetes Mother   . Hypertension Mother   . Hyperlipidemia Mother   . Depression Mother   . Anxiety disorder Mother   . Asthma Brother   . Heart disease Father        died @ 69's.  . Stroke Father   . Diabetes Father   . Hyperlipidemia Father   . Hypertension Father   . Bipolar disorder Father   . Sleep apnea Father   . Alcoholism Father   . Drug abuse Father   . Diabetes Sister   . Heart disease Paternal Grandfather   . Colon cancer Maternal Grandfather   . Breast cancer Maternal Aunt     SOCIAL HISTORY: Social History   Socioeconomic History  . Marital status: Divorced    Spouse name: Not on file  . Number of children: Not on file  . Years of education: Not on file  . Highest education level: Not on file  Occupational History  . Occupation: UNEMPLOYED    Employer: DISABLED  Social Needs  . Financial resource strain: Not on file  . Food insecurity:    Worry: Not on file    Inability: Not on file  . Transportation needs:    Medical: Not on file    Non-medical: Not on file  Tobacco Use  . Smoking status: Former Smoker    Packs/day: 0.50    Years: 15.00    Pack  years: 7.50    Types: Cigarettes    Last attempt to quit: 04/24/1985    Years since quitting: 32.8  . Smokeless tobacco: Never Used  Substance and Sexual Activity  . Alcohol use: No    Alcohol/week: 0.0 standard drinks  . Drug use: Yes    Types: Marijuana  . Sexual activity: Never  Lifestyle  . Physical activity:    Days per week: Not on file    Minutes per session: Not on file  . Stress: Not on file  Relationships  . Social connections:    Talks on phone: Not on file    Gets together: Not on file    Attends religious service: Not on file    Active member of club or organization: Not on file    Attends meetings of clubs or organizations: Not on file    Relationship status: Not on file  . Intimate partner violence:    Fear of current or ex partner: Not on file    Emotionally abused: Not on file    Physically abused: Not on file    Forced sexual activity: Not on file  Other Topics Concern  . Not on file  Social History Narrative   Lives in [place] by herself.  Disabled.  Caffeine 1 cup avg daily.  3 kids,  6 grandkids.        PHYSICAL EXAM  There were no vitals filed for this visit. There is no height or weight on file to calculate BMI.  Generalized: Well developed, in no acute distress   Neurological examination  Mentation: Alert oriented to time, place, history taking. Follows all commands speech and language fluent Cranial nerve II-XII:\Extraocular movements were full, visual field were full on confrontational test. Facial sensation and strength were normal. Uvula tongue midline. Head turning and shoulder shrug  were normal and symmetric.  Neck circumference 17 inches, Mallampati 3+ Motor: The motor testing reveals 5 over 5 strength of all 4 extremities. Good symmetric motor tone is noted throughout.  Sensory: Sensory testing is intact to soft touch on all 4 extremities. No evidence of extinction is noted.  Coordination: Cerebellar testing reveals good finger-nose-finger  and heel-to-shin bilaterally.  Gait and station: Gait is normal.   DIAGNOSTIC DATA (LABS, IMAGING, TESTING) - I reviewed patient records, labs, notes, testing and imaging myself where available.  Lab Results  Component Value Date   WBC 6.5 11/20/2017   HGB 12.8 11/20/2017   HCT 40.6 11/20/2017   MCV 85 11/20/2017   PLT 200 07/27/2017      Component Value Date/Time   NA 144 03/04/2018 1152   K 3.8 03/04/2018 1152   CL 101 03/04/2018 1152   CO2 25 03/04/2018 1152   GLUCOSE 123 (H) 03/04/2018 1152   GLUCOSE 152 (H) 07/27/2017 2248   BUN 17 03/04/2018 1152   CREATININE 0.91 03/04/2018 1152   CALCIUM 9.5 03/04/2018 1152   PROT 6.4 03/04/2018 1152   ALBUMIN 4.1 03/04/2018 1152   AST 19 03/04/2018 1152   ALT 40 (H) 03/04/2018 1152   ALKPHOS 61 03/04/2018 1152   BILITOT 0.4 03/04/2018 1152   GFRNONAA 70 03/04/2018 1152   GFRAA 80 03/04/2018 1152   Lab Results  Component Value Date   CHOL 162 03/04/2018   HDL 50 03/04/2018   LDLCALC 89 03/04/2018   TRIG 117 03/04/2018   CHOLHDL 4.2 04/23/2011   CHOLHDL 4.3 04/23/2011   Lab Results  Component Value Date   HGBA1C 6.8 (H) 03/04/2018   Lab Results  Component Value Date   VITAMINB12 432 05/25/2014   Lab Results  Component Value Date   TSH 0.638 11/20/2017      ASSESSMENT AND PLAN 57 y.o. year old female  has a past medical history of Anginal pain ([place]), Anxiety, Arthritis, Asthma, Back pain, Bone cancer ([place]), Cancer ([place]), Chest pain, CHF (congestive heart failure) ([place]), CHF (congestive heart failure) ([place]), Depression, Diabetes mellitus, Drug use, Dyspnea, Dyspnea, Frequent urination, GERD (gastroesophageal reflux disease), Glaucoma, Headache, HLD (hyperlipidemia), Hypertension, IBS (irritable bowel syndrome), Joint pain, Lactose intolerance, Leg edema, Mediastinal mass, Obesity, Palpitations, Pneumonia (05/2016), Pulmonary edema, Pulmonary embolism ([facility]), Rheumatoid arthritis ([place]), Sleep apnea, TIA (transient  ischemic attack), and Urinary urgency. here with:  1.  Obstructive sleep apnea on CPAP  The patient CPAP download showed better compliance and good treatment of her apnea.  She is encouraged to continue using the CPAP nightly and greater than 4 hours.  She was commended for her weight loss.  Advised to keep up the good work.  She will follow-up in 1 year or sooner if needed.  I spent 15 minutes with the patient. 50% of this time was spent reviewing CPAP download   Ward Givens, MSN, NP-C 03/07/2018, 11:59 AM [facility] [address] [place], [place] [address] [phone]

## 2018-03-07 NOTE — Progress Notes (Signed)
Community message sent to [facility] via Epic re: order for new CPAP supplies, place on auto service.

## 2018-03-07 NOTE — Addendum Note (Signed)
Addended by: Trudie Buckler on: 03/07/2018 03:35 PM   Modules accepted: Orders

## 2018-03-09 ENCOUNTER — Encounter (INDEPENDENT_AMBULATORY_CARE_PROVIDER_SITE_OTHER): Payer: Self-pay | Admitting: Orthopedic Surgery
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
## 2018-03-25 ENCOUNTER — Ambulatory Visit (INDEPENDENT_AMBULATORY_CARE_PROVIDER_SITE_OTHER): Payer: Medicare Other | Admitting: Family Medicine

## 2018-03-25 VITALS — BP 126/75 | HR 74 | Ht <= 58 in | Wt 205.0 lb

## 2018-03-25 DIAGNOSIS — E119 Type 2 diabetes mellitus without complications: Secondary | ICD-10-CM

## 2018-03-25 DIAGNOSIS — E559 Vitamin D deficiency, unspecified: Secondary | ICD-10-CM

## 2018-03-25 DIAGNOSIS — Z6841 Body Mass Index (BMI) 40.0 and over, adult: Secondary | ICD-10-CM

## 2018-03-25 MED ORDER — VITAMIN D (ERGOCALCIFEROL) 1.25 MG (50000 UNIT) PO CAPS: 50000.0000 [IU] | ORAL_CAPSULE | ORAL | 0 refills | Status: DC

## 2018-03-26 IMAGING — DX DG CHEST 2V
2 series · 2 of 2 positions shown · non-contrast
Comparison: 12/14/2015

CLINICAL DATA: [place]

EXAM:
CHEST  2 VIEW

[chest pa]
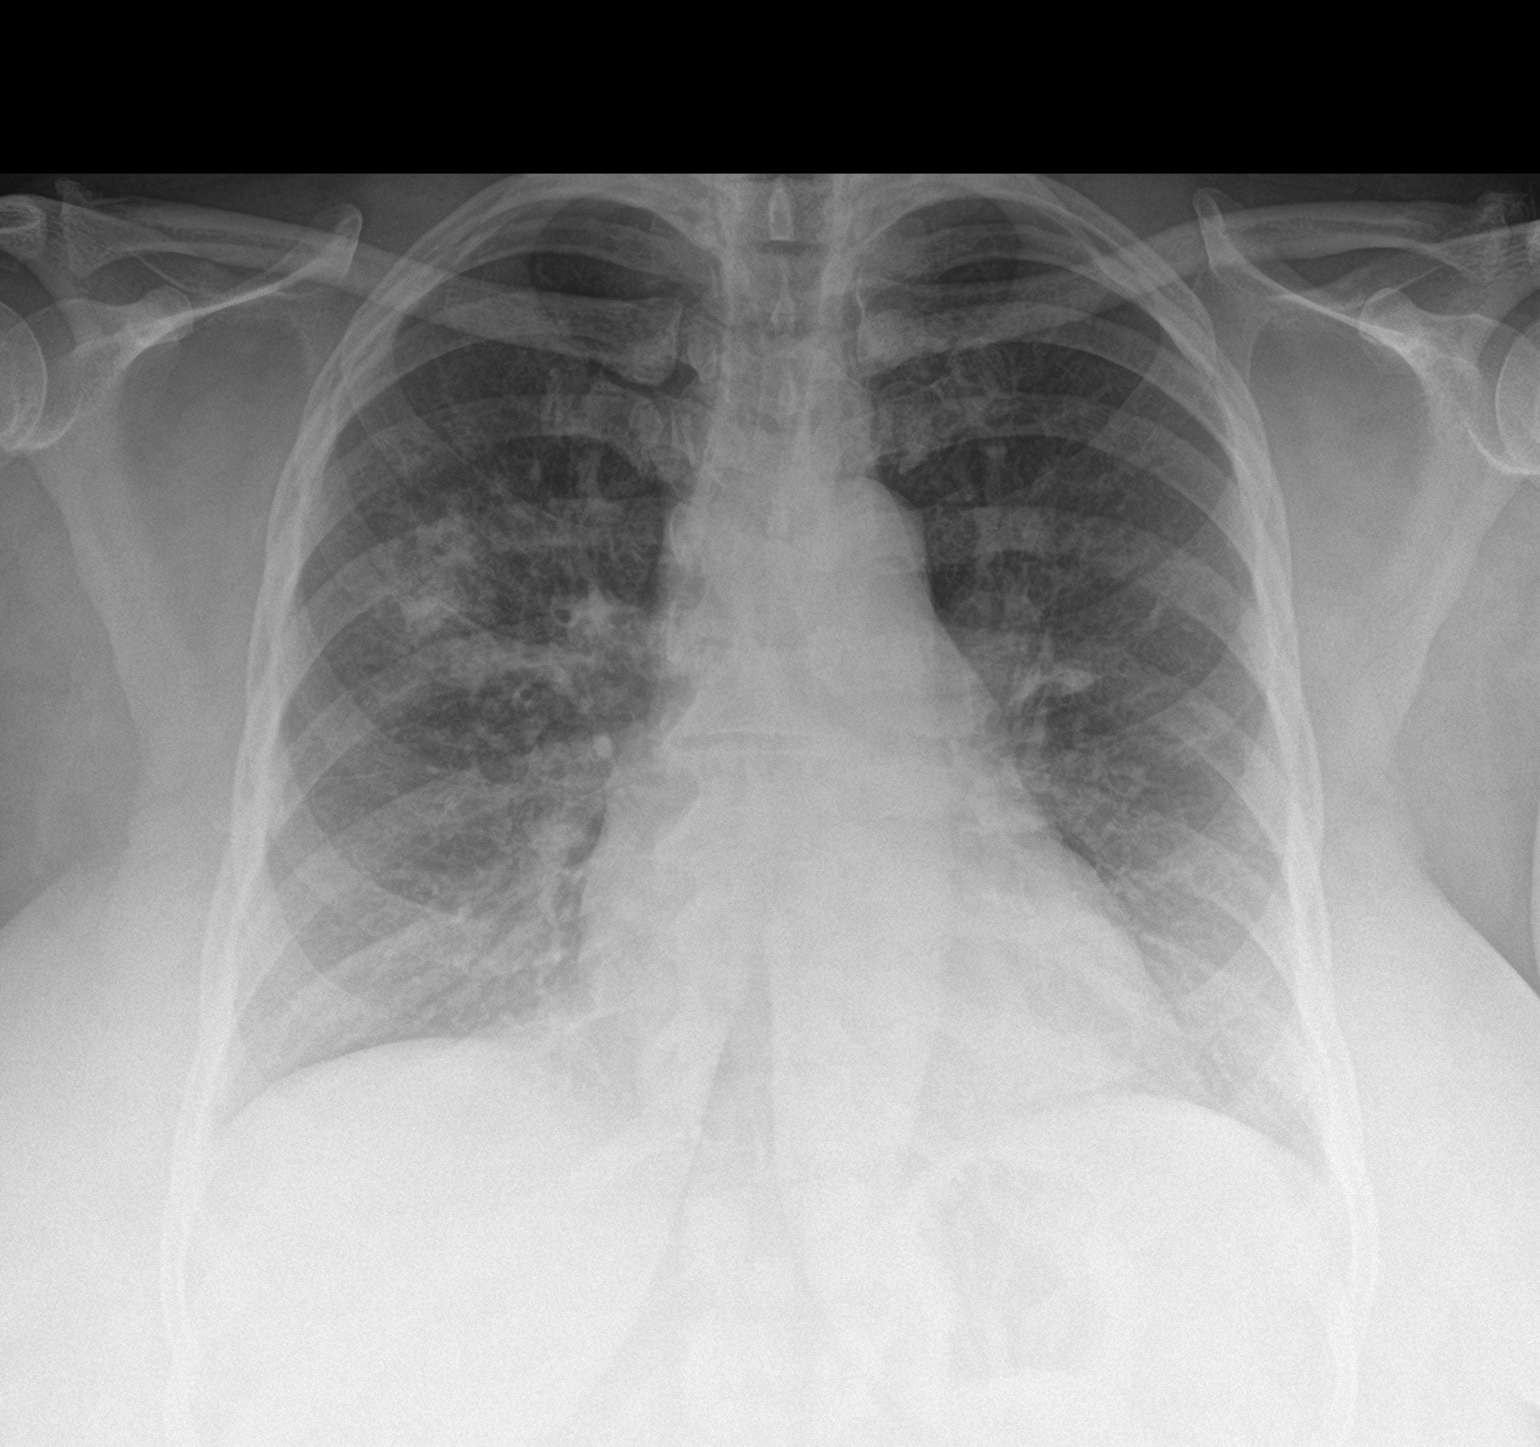

[chest lat]
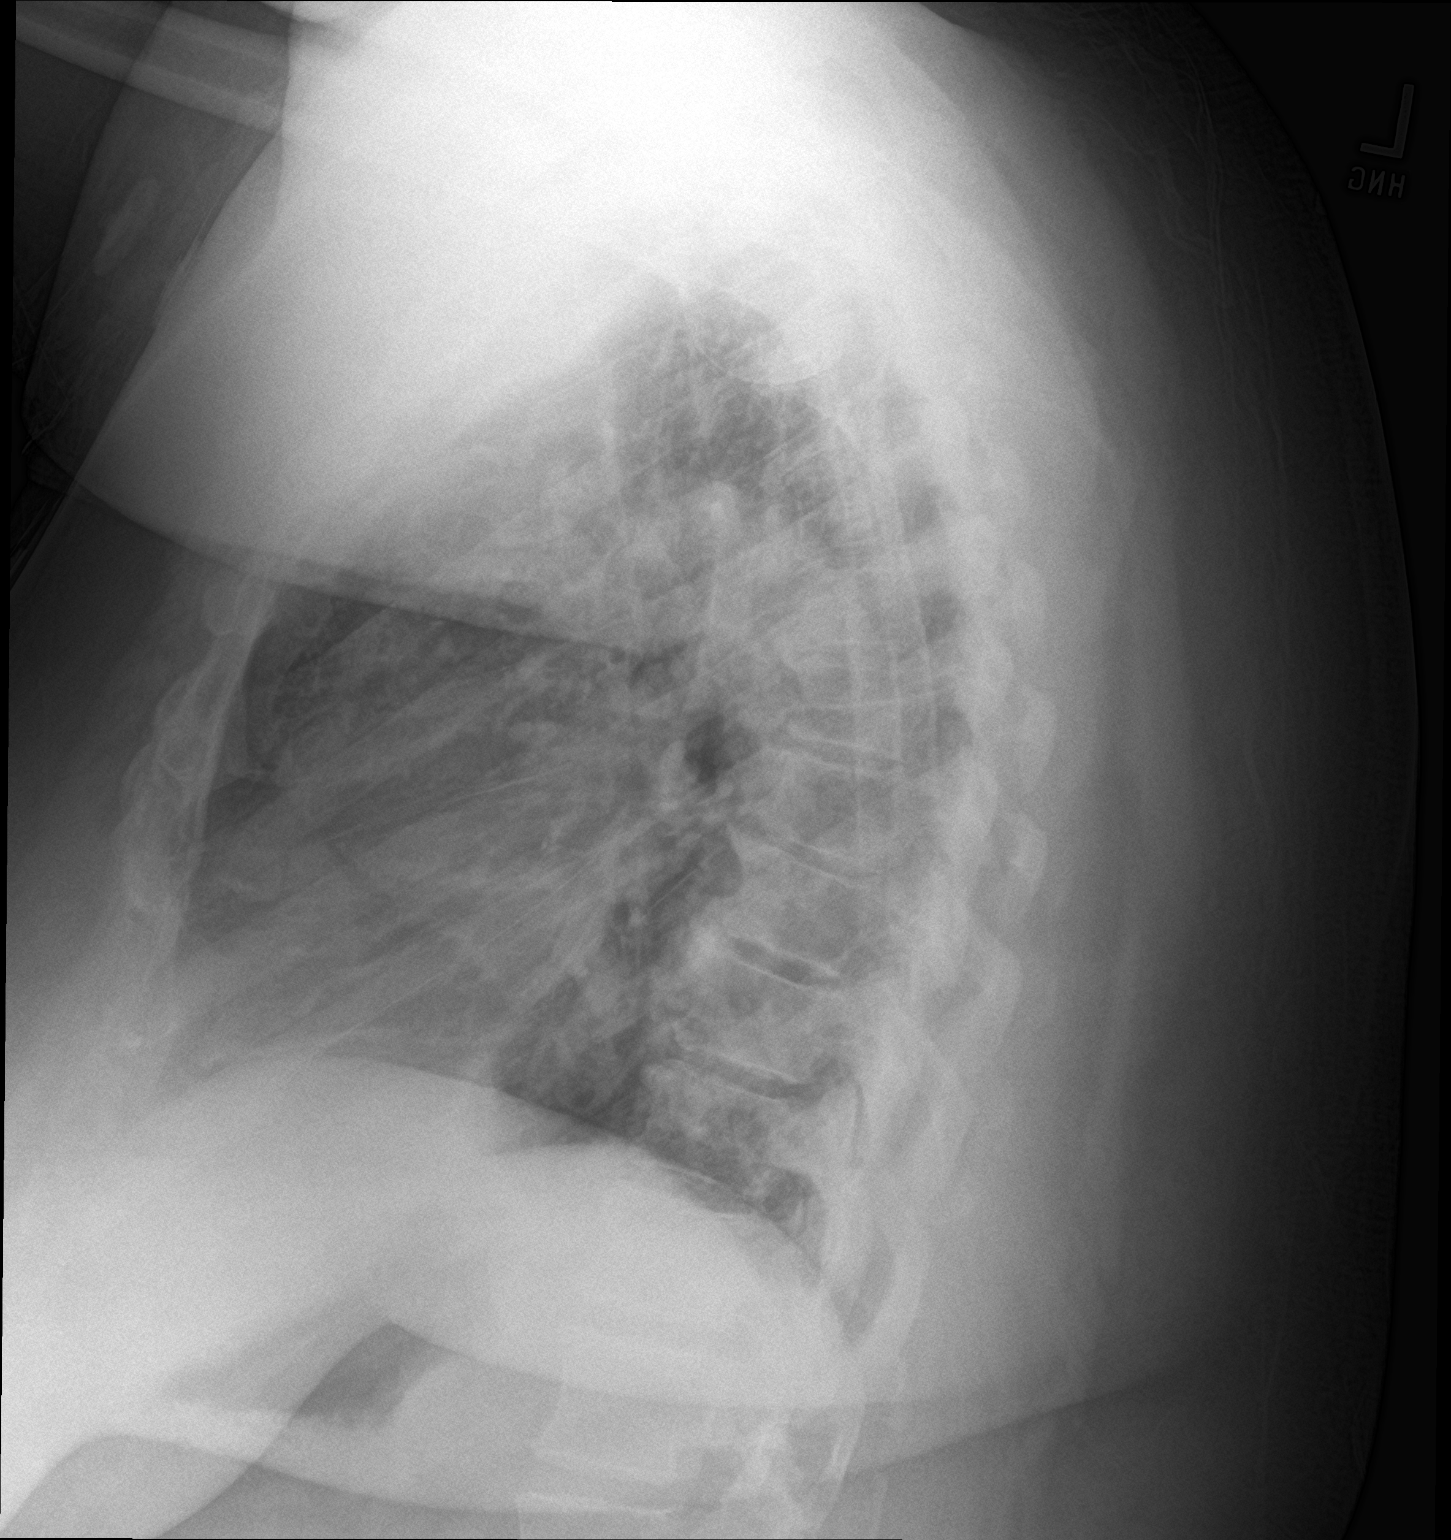

[2 of 2 positions shown; findings below may reference images not displayed]

FINDINGS: Interval development of patchy airspace disease right upper lobe,
probable pneumonia. Negative for heart failure or effusion.
Peribronchial thickening may reflect chronic lung disease. Heart
size upper normal.
IMPRESSION: Patchy right upper lobe airspace disease, possible pneumonia.

## 2018-03-27 ENCOUNTER — Encounter: Payer: Self-pay | Admitting: Physical Therapy

## 2018-03-27 ENCOUNTER — Ambulatory Visit: Payer: Medicare Other | Attending: Orthopedic Surgery | Admitting: Physical Therapy

## 2018-03-27 ENCOUNTER — Other Ambulatory Visit: Payer: Self-pay

## 2018-03-27 DIAGNOSIS — M5442 Lumbago with sciatica, left side: Secondary | ICD-10-CM | POA: Insufficient documentation

## 2018-03-27 DIAGNOSIS — M62830 Muscle spasm of back: Secondary | ICD-10-CM | POA: Insufficient documentation

## 2018-03-27 DIAGNOSIS — R262 Difficulty in walking, not elsewhere classified: Secondary | ICD-10-CM | POA: Diagnosis present

## 2018-03-27 DIAGNOSIS — M6281 Muscle weakness (generalized): Secondary | ICD-10-CM | POA: Diagnosis present

## 2018-03-27 NOTE — Progress Notes (Signed)
Office: [phone]  /  Fax: [phone]   HPI:   Chief Complaint: OBESITY [name] is here to discuss her progress with her obesity treatment plan. She is on the Category 2 plan and is following her eating plan approximately 70% of the time. She states she is walking the dog for 20-30 minutes 3-4 times per week. Kem does not always eat all the meat at lunch.  Her weight is 205 lb (93 kg) today and Tara had a weight loss of 2 pounds over a period of 3 weeks since her last visit. She Tara lost 20 lbs since starting treatment with [name].  Vitamin D Deficiency [name] Tara a diagnosis of vitamin D deficiency. She is currently taking prescription Vit D, last level was 34.5 on 03/04/18. She denies nausea, vomiting or muscle weakness.  Diabetes II [name] Tara a diagnosis of diabetes type II. [name] is on Saxagliptin-metformin. She states fasting BGs range between 86 and 109, and 2 hour post prandial range between 100 and 168. She denies hypoglycemia. Last A1c was 6.8 on 03/04/18, down from 8.8 on 11/20/17. She Tara been working on intensive lifestyle modifications including diet, exercise, and weight loss to help control her blood glucose levels.  ALLERGIES: Allergies  Allergen Reactions  . Penicillins Barrera    [name], [name], [name] [name] [name] [name] If all of the above answers are "Barrera", then may proceed with Cephalosporin use.  [name] Kitchen Hydrocodone-Acetaminophen Other (See Comments)    confusion  . Latex Barrera  . Sulfa Antibiotics Diarrhea, Itching and Barrera    MEDICATIONS: Current Outpatient Medications on File Prior to Visit  Medication Sig Dispense Refill  . amLODipine  (NORVASC) 5 MG tablet Take 5 mg by mouth daily.  6  . aspirin EC 81 MG tablet Take 81 mg by mouth daily.    [name] Kitchen atorvastatin (LIPITOR) 40 MG tablet Take 40 mg by mouth at bedtime.     . carvedilol (COREG CR) 10 MG 24 hr capsule Take 10 mg by mouth daily.    . cyclobenzaprine (FLEXERIL) 5 MG tablet Take 1 tablet (5 mg total) by mouth at bedtime. 15 tablet 0  . diclofenac sodium (VOLTAREN) 1 % GEL Apply 4 g topically 4 (four) times daily. 100 g 0  . furosemide (LASIX) 40 MG tablet Take 40 mg by mouth daily.    [name] Kitchen gabapentin (NEURONTIN) 300 MG capsule Take 2 capsules (600 mg total) by mouth 3 (three) times daily. 180 capsule 2  . HYDROcodone-acetaminophen (NORCO/VICODIN) 5-325 MG tablet Take 1 tablet by mouth every 4 (four) hours as needed. 15 tablet 0  . isosorbide dinitrate (ISORDIL) 20 MG tablet Take 20 mg by mouth 2 (two) times daily.     [name] Kitchen KLOR-CON M10 10 MEQ tablet Take 10 mEq by mouth 2 (two) times daily.     [name] Kitchen KOMBIGLYZE XR 5-500 MG TB24 Take 1 tablet by mouth every morning.     . lidocaine (LIDODERM) 5 % Place 1 patch onto the skin daily. Remove & Discard patch within 12 hours or as directed by MD 15 patch 0  . lidocaine (LIDODERM) 5 % Place 1 patch onto the skin daily. Remove & Discard patch within 12 hours or as  directed by MD 30 patch 0  . lisinopril (PRINIVIL,ZESTRIL) 20 MG tablet Take 20 mg by mouth daily.     [name] Kitchen loperamide (IMODIUM A-D) 2 MG tablet Take 2 mg by mouth 4 (four) times daily as needed for diarrhea or loose stools.    . meloxicam (MOBIC) 15 MG tablet 1 po q d x 2 wekks 30 tablet 0  . MYRBETRIQ 25 MG TB24 tablet Take 25 mg by mouth daily.  5  . omeprazole (PRILOSEC) 20 MG capsule Take 20 mg by mouth 2 (two) times daily.    . sertraline (ZOLOFT) 100 MG tablet Take 100 mg by mouth daily.      Barrera current facility-administered medications on file prior to visit.     PAST MEDICAL HISTORY: Past Medical History:  Diagnosis Date  . Anginal pain ([place])    a. NL cath in 2008;  b.  Myoview 03/2011: dec uptake along mid anterior wall on stress imaging -> ? attenuation vs. ischemia, EF 65%;  c. Echo 04/2011: EF 55-60%, Barrera RWMA, Gr 2 dd  . Anxiety   . Arthritis   . Asthma   . Back pain   . Bone cancer ([place])   . Cancer ([place])   . Chest pain   . CHF (congestive heart failure) ([place])   . CHF (congestive heart failure) ([place])   . Depression   . Diabetes mellitus   . Drug use   . [name]    with exertion  . [name]   . Frequent urination   . GERD (gastroesophageal reflux disease)   . Glaucoma   . Headache   . HLD (hyperlipidemia)   . Hypertension   . IBS (irritable bowel syndrome)   . Joint pain   . Lactose intolerance   . Leg edema   . 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 Obesity   . Palpitations   . Pneumonia 05/2016   double  . Pulmonary edema   . Pulmonary embolism ([place])    a. 2008 -> coumadin x 6 mos.  . Rheumatoid arthritis ([place])   . Sleep apnea    on CPAP 02/2018  . TIA (transient ischemic attack)   . Urinary urgency     PAST SURGICAL HISTORY: Past Surgical History:  Procedure Laterality Date  . ABDOMINAL HYSTERECTOMY  2005  . CARDIAC CATHETERIZATION     Normal  . CARDIAC CATHETERIZATION N/A 09/30/2014   Procedure: Left Heart Cath and Coronary Angiography;  Surgeon: Sherren Mocha, MD;  Location: [facility];  Service: Cardiovascular;  Laterality: N/A;  . LAPAROSCOPIC APPENDECTOMY N/A 06/03/2012   Procedure: APPENDECTOMY LAPAROSCOPIC;  Surgeon: Stark Klein, MD;  Location: [facility];  Service: General;  Laterality: N/A;  . Left knee surgery  2008  . LEG SURGERY    . TONSILLECTOMY    . TOTAL KNEE ARTHROPLASTY Left 08/22/2016   Procedure: TOTAL KNEE ARTHROPLASTY;  Surgeon: Meredith Pel, MD;  Location: [facility];  Service: Orthopedics;  Laterality: Left;  . TUBAL LIGATION  1989    SOCIAL HISTORY: Social History   Tobacco Use  . Smoking status: Former Smoker    Packs/day: 0.50    Years: 15.00    Pack years: 7.50     Types: Cigarettes    Last attempt to quit: 04/24/1985    Years since quitting: 32.9  . Smokeless tobacco: Never Used  Substance Use Topics  . Alcohol use: Barrera    Alcohol/week: 0.0 standard  drinks  . Drug use: Barrera    Types: Marijuana    FAMILY HISTORY: Family History  Problem Relation Age of Onset  . Emphysema Mother   . Arthritis Mother   . Heart failure Mother        alive @ 49  . Stroke Mother   . Diabetes Mother   . Hypertension Mother   . Hyperlipidemia Mother   . Depression Mother   . Anxiety disorder Mother   . Asthma Brother   . Heart disease Father        died @ 8's.  . Stroke Father   . Diabetes Father   . Hyperlipidemia Father   . Hypertension Father   . Bipolar disorder Father   . Sleep apnea Father   . Alcoholism Father   . Drug abuse Father   . Diabetes Sister   . Heart disease Paternal Grandfather   . Colon cancer Maternal Grandfather   . Breast cancer Maternal Aunt     ROS: Review of Systems  Constitutional: Positive for weight loss.  Gastrointestinal: Negative for nausea and vomiting.  Musculoskeletal:       Negative muscle weakness  Endo/Heme/Allergies:       Negative hypoglycemia    PHYSICAL EXAM: Blood pressure 126/75, pulse 74, height 4\' 10"  (1.473 m), weight 205 lb (93 kg), SpO2 98 %. Body mass index is 42.85 kg/m. Physical Exam  Constitutional: She is oriented to person, place, and time. She appears well-developed and well-nourished.  Cardiovascular: Normal rate.  Pulmonary/Chest: Effort normal.  Musculoskeletal: Normal range of motion.  Neurological: She is oriented to person, place, and time.  Skin: Skin is warm and dry.  Psychiatric: She Tara a normal mood and affect. Her behavior is normal.  Vitals reviewed.   RECENT LABS AND TESTS: BMET    Component Value Date/Time   NA 144 03/04/2018 1152   K 3.8 03/04/2018 1152   CL 101 03/04/2018 1152   CO2 25 03/04/2018 1152   GLUCOSE 123 (H) 03/04/2018 1152   GLUCOSE 152 (H)  07/27/2017 2248   BUN 17 03/04/2018 1152   CREATININE 0.91 03/04/2018 1152   CALCIUM 9.5 03/04/2018 1152   GFRNONAA 70 03/04/2018 1152   GFRAA 80 03/04/2018 1152   Lab Results  Component Value Date   HGBA1C 6.8 (H) 03/04/2018   HGBA1C 8.8 (H) 11/20/2017   HGBA1C 7.4 (H) 08/11/2016   HGBA1C 6.6 (H) 05/21/2016   HGBA1C 6.2 (H) 10/28/2012   Lab Results  Component Value Date   INSULIN 20.2 03/04/2018   INSULIN 53.9 (H) 11/20/2017   CBC    Component Value Date/Time   WBC 6.5 11/20/2017 0925   WBC 9.5 07/27/2017 2248   RBC 4.78 11/20/2017 0925   RBC 4.87 07/27/2017 2248   HGB 12.8 11/20/2017 0925   HCT 40.6 11/20/2017 0925   PLT 200 07/27/2017 2248   MCV 85 11/20/2017 0925   MCH 26.8 11/20/2017 0925   MCH 28.1 07/27/2017 2248   MCHC 31.5 11/20/2017 0925   MCHC 33.6 07/27/2017 2248   RDW 13.9 11/20/2017 0925   LYMPHSABS 2.1 11/20/2017 0925   MONOABS 0.2 05/21/2016 0800   EOSABS 0.4 11/20/2017 0925   BASOSABS 0.0 11/20/2017 0925   Iron/TIBC/Ferritin/ %Sat Barrera results found for: IRON, TIBC, FERRITIN, IRONPCTSAT Lipid Panel     Component Value Date/Time   CHOL 162 03/04/2018 1152   TRIG 117 03/04/2018 1152   HDL 50 03/04/2018 1152   CHOLHDL 4.2 04/23/2011 0830  CHOLHDL 4.3 04/23/2011 0830   VLDL 29 04/23/2011 0830   VLDL 29 04/23/2011 0830   LDLCALC 89 03/04/2018 1152   Hepatic Function Panel     Component Value Date/Time   PROT 6.4 03/04/2018 1152   ALBUMIN 4.1 03/04/2018 1152   AST 19 03/04/2018 1152   ALT 40 (H) 03/04/2018 1152   ALKPHOS 61 03/04/2018 1152   BILITOT 0.4 03/04/2018 1152   BILIDIR 0.1 05/22/2016 0913   IBILI 0.6 05/22/2016 0913      Component Value Date/Time   TSH 0.638 11/20/2017 0925   TSH 0.112 (L) 05/24/2016 0629   TSH 0.345 (L) 06/18/2014 0930   TSH 0.167 (L) 10/28/2012 0144  Results for YULENI, BURICH (MRN [id number]) as of 03/27/2018 17:26  Ref. Range 03/04/2018 11:52  Vitamin D, 25-Hydroxy Latest Ref Range: 30.0 - 100.0  ng/mL 34.5    ASSESSMENT AND PLAN: Vitamin D deficiency - Plan: Vitamin D, Ergocalciferol, (DRISDOL) 1.25 MG (50000 UT) CAPS capsule  Type 2 diabetes mellitus without complication, without long-term current use of insulin (HCC)  Class 3 severe obesity with serious comorbidity and body mass index (BMI) of 40.0 to 44.9 in adult, unspecified obesity type ([place])  PLAN:  Vitamin D Deficiency Payslie was informed that low vitamin D levels contributes to fatigue and are associated with obesity, breast, and colon cancer. Tanesha agrees to continue taking prescription Vit D @50 ,000 IU every week #4 and we will refill for 1 month. She will follow up for routine testing of vitamin D, at least 2-3 times per year. She was informed of the risk of over-replacement of vitamin D and agrees to not increase her dose unless she discusses this with [name] first. Sariah agrees to follow up with our clinic in 2 weeks.  Diabetes II Nikala Tara been given extensive diabetes education by myself today including ideal fasting and post-prandial blood glucose readings, individual ideal Hgb A1c goals and hypoglycemia prevention. We discussed the importance of good blood sugar control to decrease the likelihood of diabetic complications such as nephropathy, neuropathy, limb loss, blindness, coronary artery disease, and death. We discussed the importance of intensive lifestyle modification including diet, exercise and weight loss as the first line treatment for diabetes. Filicia agrees to continue taking Saxagliptin-metformin and she agrees to follow up with our clinic in 2 weeks.  Obesity Vassie is currently in the action stage of change. As such, her goal is to continue with weight loss efforts She Tara agreed to follow the Category 2 plan Jachelle Tara been instructed to work up to a goal of 150 minutes of combined cardio and strengthening exercise per week for weight loss and overall health benefits. We discussed the following  Behavioral Modification Strategies today: increasing lean protein intake and planning for success Erendida will move meat from lunch to a snack. We discussed exchanges for 2 oz meat.  Mescal Tara agreed to follow up with our clinic in 2 weeks. She was informed of the importance of frequent follow up visits to maximize her success with intensive lifestyle modifications for her multiple health conditions.   OBESITY BEHAVIORAL INTERVENTION VISIT  Today's visit was # 8  Starting weight: 225 lbs Starting date: 11/20/17 Today's weight : 205 lbs Today's date: 03/25/2018 Total lbs lost to date: 20 At least 15 minutes were spent on discussing the following behavioral intervention visit.   ASK: We discussed the diagnosis of obesity with Edwyna Ready today and Shaquera agreed to give [name] permission to discuss obesity  behavioral modification therapy today.  ASSESS: Edyth Tara the diagnosis of obesity and her BMI today is 38.86 Marbella is in the action stage of change   ADVISE: Sherley was educated on the multiple health risks of obesity as well as the benefit of weight loss to improve her health. She was advised of the need for long term treatment and the importance of lifestyle modifications to improve her current health and to decrease her risk of future health problems.  AGREE: Multiple dietary modification options and treatment options were discussed and  Kariah agreed to follow the recommendations documented in the above note.  ARRANGE: Tawonda was educated on the importance of frequent visits to treat obesity as outlined per CMS and USPSTF guidelines and agreed to schedule her next follow up appointment today.  Wilhemena Durie, am acting as Location manager for [employer], FNP-C.  I have reviewed the above documentation for accuracy and completeness, and I agree with the above.  - Dawn Whitmire, FNP-C.

## 2018-03-27 NOTE — Therapy (Signed)
[address] [place], [place], [address] Phone: [phone]   Fax:  [phone]  Physical Therapy Evaluation  Patient Details  Name: [name] MRN: [id number] Date of Birth: Apr 19, 1961 Referring Provider (PT): Jerry Caras Date: 03/27/2018  PT End of Session - 03/27/18 1052    Visit Number  1    Date for PT Re-Evaluation  05/28/18    PT Start Time  1024    PT Stop Time  1110    PT Time Calculation (min)  46 min    Activity Tolerance  Patient tolerated treatment well    Behavior During Therapy  [facility] for tasks assessed/performed       Past Medical History:  Diagnosis Date  . Anginal pain ([place])    a. NL cath in 2008;  b. Myoview 03/2011: dec uptake along mid anterior wall on stress imaging -> ? attenuation vs. ischemia, EF 65%;  c. Echo 04/2011: EF 55-60%, no RWMA, Gr 2 dd  . Anxiety   . Arthritis   . Asthma   . Back pain   . Bone cancer ([place])   . Cancer ([place])   . Chest pain   . CHF (congestive heart failure) ([place])   . CHF (congestive heart failure) ([place])   . Depression   . Diabetes mellitus   . Drug use   . Dyspnea    with exertion  . Dyspnea   . Frequent urination   . GERD (gastroesophageal reflux disease)   . Glaucoma   . Headache   . HLD (hyperlipidemia)   . Hypertension   . IBS (irritable bowel syndrome)   . Joint pain   . Lactose intolerance   . Leg edema   . 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 Obesity   . Palpitations   . Pneumonia 05/2016   double  . Pulmonary edema   . Pulmonary embolism ([place])    a. 2008 -> coumadin x 6 mos.  . Rheumatoid arthritis ([place])   . Sleep apnea    on CPAP 02/2018  . TIA (transient ischemic attack)   . Urinary urgency     Past Surgical History:  Procedure Laterality Date  . ABDOMINAL HYSTERECTOMY  2005  . CARDIAC CATHETERIZATION     Normal  . CARDIAC CATHETERIZATION N/A 09/30/2014   Procedure: Left Heart Cath  and Coronary Angiography;  Surgeon: Sherren Mocha, MD;  Location: [facility];  Service: Cardiovascular;  Laterality: N/A;  . LAPAROSCOPIC APPENDECTOMY N/A 06/03/2012   Procedure: APPENDECTOMY LAPAROSCOPIC;  Surgeon: Stark Klein, MD;  Location: [facility];  Service: General;  Laterality: N/A;  . Left knee surgery  2008  . LEG SURGERY    . TONSILLECTOMY    . TOTAL KNEE ARTHROPLASTY Left 08/22/2016   Procedure: TOTAL KNEE ARTHROPLASTY;  Surgeon: Meredith Pel, MD;  Location: [facility];  Service: Orthopedics;  Laterality: Left;  . TUBAL LIGATION  1989    There were no vitals filed for this visit.   Subjective Assessment - 03/27/18 1031    Subjective  Patient reports that she had a fall October 15th that increased her normal LBP.  MRI shows DDD, arthritis and some disc bulges.  She reports that she has had two injections with minimal relief.    Pertinent History  anxiety, TIA, left TKR, past CA, RA, CHF, IBS, GERD    Limitations  House hold activities;Walking  How long can you walk comfortably?  200'    Patient Stated Goals  have less pain    Currently in Pain?  Yes    Pain Score  7     Pain Location  Back    Pain Orientation  Left;Lower    Pain Descriptors / Indicators  Burning;Aching;Stabbing    Pain Type  Acute pain    Pain Radiating Towards  c/o some left tight and groin pain    Pain Onset  More than a month ago    Pain Frequency  Constant    Aggravating Factors   walking, bending, standing pain can be up to 9-10/10    Pain Relieving Factors  rest, pain patches at best pain a 4/10    Effect of Pain on Daily Activities  limits everything due to pain         [facility] PT Assessment - 03/27/18 0001      Assessment   Medical Diagnosis  LBP with left sciatica    Referring Provider (PT)  Dean    Onset Date/Surgical Date  02/25/18    Prior Therapy  no      Precautions   Precautions  None      Balance Screen   Has the patient fallen in the past 6 months  Yes    How many  times?  1    Has the patient had a decrease in activity level because of a fear of falling?   No    Is the patient reluctant to leave their home because of a fear of falling?   No      Home Environment   Additional Comments  does housework, has a 44 and 47 year old grandkids that are around      Prior Function   Level of Independence  Independent    Vocation  On disability    Leisure  no exercise      Posture/Postural Control   Posture Comments  fwd head, rounded shoulders      ROM / Strength   AROM / PROM / Strength  AROM;Strength      AROM   Overall AROM Comments  Lumbar ROM decreased 75% for all motions with pain in the low back and the left hip      Strength   Overall Strength Comments  hips 4-/5 with pain in the left hip and the low back      Flexibility   Soft Tissue Assessment /Muscle Length  yes    Hamstrings  tight    Piriformis  tight with pain      Palpation   Palpation comment  she is very tight and tender in the low back and the left hip, groin and thigh      Ambulation/Gait   Gait Comments  has a trendelenberg pattern on the left                Objective measurements completed on examination: See above findings.      [facility] Adult PT Treatment/Exercise - 03/27/18 0001      Modalities   Modalities  Electrical Stimulation;Moist Heat      Moist Heat Therapy   Number Minutes Moist Heat  15 Minutes    Moist Heat Location  Lumbar Spine;Hip      Electrical Stimulation   Electrical Stimulation Location  left low back into the left lateral hip    Electrical Stimulation Action  IFC    Electrical Stimulation Parameters  supine    Electrical Stimulation Goals  Pain             PT Education - 03/27/18 1051    Education Details  Wms flwxion    Person(s) Educated  Patient    Methods  Explanation;Demonstration;Handout    Comprehension  Verbalized understanding       PT Short Term Goals - 03/27/18 1056      PT SHORT TERM GOAL #1   Title   independent with initial HEP    Time  2    Period  Weeks    Status  New        PT Long Term Goals - 03/27/18 1058      PT LONG TERM GOAL #1   Title  understand posture and body mechanics    Time  8    Period  Weeks    Status  New      PT LONG TERM GOAL #2   Title  decrease pain 50%    Time  8    Period  Weeks    Status  New      PT LONG TERM GOAL #3   Title  increase lumbar ROM 25%    Time  8    Period  Weeks    Status  New      PT LONG TERM GOAL #4   Title  report able to sleep without difficulty    Time  8    Period  Weeks    Status  New             Plan - 03/27/18 1053    Clinical Impression Statement  Patient reports that she has had back pain for a number of years, reports that she had a fall in October and made it worse and that she has had increased left hip and leg pain.  She reports that she has had two injections with no relief.  She has very limited lumbar ROM, weak in the hips with pain in the back and the left hip.  She has a trendelenberg on the left    History and Personal Factors relevant to plan of care:  CA, GERD, IBS, TIA, RA, TKR    Clinical Presentation  Stable    Clinical Decision Making  Low    Rehab Potential  Good    PT Frequency  2x / week    PT Duration  8 weeks    PT Treatment/Interventions  ADLs/Self Care Home Management;Electrical Stimulation;Moist Heat;Ultrasound;Therapeutic exercise;Therapeutic activities;Functional mobility training;Patient/family education;Manual techniques    PT Next Visit Plan  slowly start exercises    Consulted and Agree with Plan of Care  Patient       Patient will benefit from skilled therapeutic intervention in order to improve the following deficits and impairments:  Abnormal gait, Decreased range of motion, Difficulty walking, Increased muscle spasms, Decreased activity tolerance, Pain, Improper body mechanics, Impaired flexibility, Decreased strength, Decreased mobility, Postural dysfunction  Visit  Diagnosis: Acute bilateral low back pain with left-sided sciatica - Plan: PT plan of care cert/re-cert  Muscle spasm of back - Plan: PT plan of care cert/re-cert  Difficulty in walking, not elsewhere classified - Plan: PT plan of care cert/re-cert  Muscle weakness (generalized) - Plan: PT plan of care cert/re-cert     Problem List Patient Active Problem List   Diagnosis Date Noted  . Other fatigue 11/20/2017  . Shortness of breath on exertion 11/20/2017  . Type 2 diabetes  mellitus without complication, without long-term current use of insulin ([place]) 11/20/2017  . Vitamin D deficiency 11/20/2017  . Depression 11/20/2017  . Other hyperlipidemia 11/20/2017  . 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 Presence of left artificial knee joint 09/20/2016  . Arthritis of knee 08/22/2016  . Cervical radiculopathy 07/26/2016  . Cervical disc disorder with radiculopathy 07/26/2016  . Right arm pain 06/29/2016  . Cervicalgia 06/29/2016  . Primary osteoarthritis of left knee 06/29/2016  . Hypersomnia with sleep apnea 11/18/2015  . Lethargy 11/18/2015  . Chest pain 09/30/2014  . Abnormal cardiac function test 09/29/2014  . Chest pain with moderate risk for cardiac etiology 09/28/2014  . HTN (hypertension) 09/28/2014  . Hypokalemia   . Paresthesia 06/18/2014  . Numbness and tingling of left arm and leg   . Unstable angina ([place]) 05/24/2014  . OSA on CPAP 05/24/2014  . Diabetes mellitus type 2 in obese ([place]) 10/29/2012  . S/P laparoscopic appendectomy 06/04/2012  . Diastolic CHF ([place]) 52/11/221  . Mediastinal mass 01/09/2012  . SOB (shortness of breath) 06/20/2011  . Mediastinal abnormality 06/20/2011  . Dyslipidemia 12/23/2009  . GLAUCOMA 12/23/2009  . ARTHRITIS 12/23/2009  . 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 Morbid obesity-BMI 45 09/13/2006  . ANXIETY STATE NOS 09/13/2006  . DISORDER, DEPRESSIVE NEC 09/13/2006  . CARPAL TUNNEL SYNDROME, MILD 09/13/2006  . Unspecified essential  hypertension 09/13/2006  . IBS 09/13/2006  . DEGENERATION, LUMBAR/LUMBOSACRAL DISC 09/13/2006  . SYMPTOM, SWELLING/MASS/LUMP IN CHEST 09/13/2006  . PULMONARY EMBOLISM, HX OF 09/13/2006    Sumner Boast., PT 03/27/2018, 11:01 AM  [address] [place], [place], [address] Phone: [phone]   Fax:  [phone]  Name: [name] MRN: [id number] Date of Birth: May 23, 1960

## 2018-03-28 ENCOUNTER — Encounter (INDEPENDENT_AMBULATORY_CARE_PROVIDER_SITE_OTHER): Payer: Self-pay | Admitting: Family Medicine

## 2018-03-29 ENCOUNTER — Other Ambulatory Visit (INDEPENDENT_AMBULATORY_CARE_PROVIDER_SITE_OTHER): Payer: Self-pay | Admitting: Orthopedic Surgery

## 2018-03-29 NOTE — Telephone Encounter (Signed)
Ok to refill 

## 2018-04-01 NOTE — Telephone Encounter (Signed)
y

## 2018-04-02 ENCOUNTER — Ambulatory Visit: Payer: Medicare Other | Admitting: Physical Therapy

## 2018-04-02 DIAGNOSIS — R262 Difficulty in walking, not elsewhere classified: Secondary | ICD-10-CM

## 2018-04-02 DIAGNOSIS — M62830 Muscle spasm of back: Secondary | ICD-10-CM

## 2018-04-02 DIAGNOSIS — M5442 Lumbago with sciatica, left side: Secondary | ICD-10-CM

## 2018-04-02 DIAGNOSIS — M6281 Muscle weakness (generalized): Secondary | ICD-10-CM

## 2018-04-02 NOTE — Therapy (Signed)
[address] [place], [place], [address] Phone: [phone]   Fax:  [phone]  Physical Therapy Treatment  Patient Details  Name: [name] MRN: [id number] Date of Birth: May 25, 1960 Referring Provider (PT): Jerry Caras Date: 04/02/2018  PT End of Session - 04/02/18 1057    Visit Number  2    Date for PT Re-Evaluation  05/28/18    PT Start Time  1029    PT Stop Time  1115    PT Time Calculation (min)  46 min    Activity Tolerance  Patient tolerated treatment well    Behavior During Therapy  [facility] for tasks assessed/performed       Past Medical History:  Diagnosis Date  . Anginal pain ([place])    a. NL cath in 2008;  b. Myoview 03/2011: dec uptake along mid anterior wall on stress imaging -> ? attenuation vs. ischemia, EF 65%;  c. Echo 04/2011: EF 55-60%, no RWMA, Gr 2 dd  . Anxiety   . Arthritis   . Asthma   . Back pain   . Bone cancer ([place])   . Cancer ([place])   . Chest pain   . CHF (congestive heart failure) ([place])   . CHF (congestive heart failure) ([place])   . Depression   . Diabetes mellitus   . Drug use   . Dyspnea    with exertion  . Dyspnea   . Frequent urination   . GERD (gastroesophageal reflux disease)   . Glaucoma   . Headache   . HLD (hyperlipidemia)   . Hypertension   . IBS (irritable bowel syndrome)   . Joint pain   . Lactose intolerance   . Leg edema   . 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 Obesity   . Palpitations   . Pneumonia 05/2016   double  . Pulmonary edema   . Pulmonary embolism ([place])    a. 2008 -> coumadin x 6 mos.  . Rheumatoid arthritis ([place])   . Sleep apnea    on CPAP 02/2018  . TIA (transient ischemic attack)   . Urinary urgency     Past Surgical History:  Procedure Laterality Date  . ABDOMINAL HYSTERECTOMY  2005  . CARDIAC CATHETERIZATION     Normal  . CARDIAC CATHETERIZATION N/A 09/30/2014   Procedure: Left Heart Cath  and Coronary Angiography;  Surgeon: Sherren Mocha, MD;  Location: [facility];  Service: Cardiovascular;  Laterality: N/A;  . LAPAROSCOPIC APPENDECTOMY N/A 06/03/2012   Procedure: APPENDECTOMY LAPAROSCOPIC;  Surgeon: Stark Klein, MD;  Location: [facility];  Service: General;  Laterality: N/A;  . Left knee surgery  2008  . LEG SURGERY    . TONSILLECTOMY    . TOTAL KNEE ARTHROPLASTY Left 08/22/2016   Procedure: TOTAL KNEE ARTHROPLASTY;  Surgeon: Meredith Pel, MD;  Location: [facility];  Service: Orthopedics;  Laterality: Left;  . TUBAL LIGATION  1989    There were no vitals filed for this visit.  Subjective Assessment - 04/02/18 1030    Subjective  "It has been rough, Iv been in so much pain"    Currently in Pain?  Yes    Pain Score  8     Pain Location  Back    Pain Orientation  Lower;Left  [facility] Adult PT Treatment/Exercise - 04/02/18 0001      Exercises   Exercises  Lumbar      Lumbar Exercises: Aerobic   Nustep  L3 6 min      Lumbar Exercises: Standing   Row  AROM;Strengthening;Both;20 reps;Theraband    Theraband Level (Row)  Level 2 (Red)    Shoulder Extension  AROM;Strengthening;Both;20 reps;Theraband    Theraband Level (Shoulder Extension)  Level 2 (Red)    Other Standing Lumbar Exercises  Hip 3 way      Lumbar Exercises: Seated   Long Arc Quad on Chair  AROM;Strengthening;Left;3 sets;10 reps;Weights    LAQ on Chair Weights (lbs)  3#    Sit to Stand  10 reps   2x10   Other Seated Lumbar Exercises  ball squeeze, clams 2x10      Modalities   Modalities  Electrical Stimulation;Moist Heat      Moist Heat Therapy   Number Minutes Moist Heat  15 Minutes    Moist Heat Location  Lumbar Spine;Hip      Electrical Stimulation   Electrical Stimulation Location  left low back into the left lateral hip    Electrical Stimulation Action  IFC    Electrical Stimulation Parameters  supine    Electrical Stimulation Goals  Pain                PT Short Term Goals - 04/02/18 1101      PT SHORT TERM GOAL #1   Title  independent with initial HEP    Time  2    Period  Weeks    Status  Achieved        PT Long Term Goals - 03/27/18 1058      PT LONG TERM GOAL #1   Title  understand posture and body mechanics    Time  8    Period  Weeks    Status  New      PT LONG TERM GOAL #2   Title  decrease pain 50%    Time  8    Period  Weeks    Status  New      PT LONG TERM GOAL #3   Title  increase lumbar ROM 25%    Time  8    Period  Weeks    Status  New      PT LONG TERM GOAL #4   Title  report able to sleep without difficulty    Time  8    Period  Weeks    Status  New            Plan - 04/02/18 1058    Clinical Impression Statement  Pt arrived 14 minutes late to todays session. Pt presenmted with alot of pain in R lumbar area. Pt ambulated very slow with some Trendelenberg with LLE. Pt required min VC for sequencing of Tband exercises. Pt had increased pain with L hip extensions while doing hip 3 way. Exercise was stopped and pain relieved to where it started.     Rehab Potential  Good    PT Frequency  2x / week    PT Duration  8 weeks    PT Treatment/Interventions  ADLs/Self Care Home Management;Electrical Stimulation;Moist Heat;Ultrasound;Therapeutic exercise;Therapeutic activities;Functional mobility training;Patient/family education;Manual techniques    PT Next Visit Plan  Progress as pain will allow    Consulted and Agree with Plan of Care  Patient       Patient will benefit from  skilled therapeutic intervention in order to improve the following deficits and impairments:  Abnormal gait, Decreased range of motion, Difficulty walking, Increased muscle spasms, Decreased activity tolerance, Pain, Improper body mechanics, Impaired flexibility, Decreased strength, Decreased mobility, Postural dysfunction  Visit Diagnosis: Acute bilateral low back pain with left-sided sciatica  Muscle spasm  of back  Difficulty in walking, not elsewhere classified  Muscle weakness (generalized)     Problem List Patient Active Problem List   Diagnosis Date Noted  . Other fatigue 11/20/2017  . Shortness of breath on exertion 11/20/2017  . Type 2 diabetes mellitus without complication, without long-term current use of insulin ([place]) 11/20/2017  . Vitamin D deficiency 11/20/2017  . Depression 11/20/2017  . Other hyperlipidemia 11/20/2017  . 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 Presence of left artificial knee joint 09/20/2016  . Arthritis of knee 08/22/2016  . Cervical radiculopathy 07/26/2016  . Cervical disc disorder with radiculopathy 07/26/2016  . Right arm pain 06/29/2016  . Cervicalgia 06/29/2016  . Primary osteoarthritis of left knee 06/29/2016  . Hypersomnia with sleep apnea 11/18/2015  . Lethargy 11/18/2015  . Chest pain 09/30/2014  . Abnormal cardiac function test 09/29/2014  . Chest pain with moderate risk for cardiac etiology 09/28/2014  . HTN (hypertension) 09/28/2014  . Hypokalemia   . Paresthesia 06/18/2014  . Numbness and tingling of left arm and leg   . Unstable angina ([place]) 05/24/2014  . OSA on CPAP 05/24/2014  . Diabetes mellitus type 2 in obese ([place]) 10/29/2012  . S/P laparoscopic appendectomy 06/04/2012  . Diastolic CHF ([place]) 42/70/6237  . Mediastinal mass 01/09/2012  . SOB (shortness of breath) 06/20/2011  . Mediastinal abnormality 06/20/2011  . Dyslipidemia 12/23/2009  . GLAUCOMA 12/23/2009  . ARTHRITIS 12/23/2009  . 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 Morbid obesity-BMI 45 09/13/2006  . ANXIETY STATE NOS 09/13/2006  . DISORDER, DEPRESSIVE NEC 09/13/2006  . CARPAL TUNNEL SYNDROME, MILD 09/13/2006  . Unspecified essential hypertension 09/13/2006  . IBS 09/13/2006  . DEGENERATION, LUMBAR/LUMBOSACRAL DISC 09/13/2006  . SYMPTOM, SWELLING/MASS/LUMP IN CHEST 09/13/2006  . PULMONARY EMBOLISM, HX OF 09/13/2006    Howell Rucks, SPTA 04/02/2018, 11:03  AM  [address] [place], [place], [address] Phone: [phone]   Fax:  [phone]  Name: [name] MRN: [id number] Date of Birth: 1960-08-12

## 2018-04-05 ENCOUNTER — Ambulatory Visit: Payer: Medicare Other | Admitting: Physical Therapy

## 2018-04-09 ENCOUNTER — Ambulatory Visit: Payer: Medicare Other | Admitting: Physical Therapy

## 2018-04-09 DIAGNOSIS — M5442 Lumbago with sciatica, left side: Secondary | ICD-10-CM

## 2018-04-09 DIAGNOSIS — M62830 Muscle spasm of back: Secondary | ICD-10-CM

## 2018-04-09 DIAGNOSIS — R262 Difficulty in walking, not elsewhere classified: Secondary | ICD-10-CM

## 2018-04-09 DIAGNOSIS — M6281 Muscle weakness (generalized): Secondary | ICD-10-CM

## 2018-04-09 NOTE — Therapy (Signed)
[address] [place], [place], [address] Phone: [phone]   Fax:  [phone]  Physical Therapy Treatment  Patient Details  Name: [name] MRN: [id number] Date of Birth: 1960-09-10 Referring Provider (PT): Jerry Caras Date: 04/09/2018  PT End of Session - 04/09/18 1122    Visit Number  3    Date for PT Re-Evaluation  05/28/18    PT Start Time  1100    PT Stop Time  1150    PT Time Calculation (min)  50 min       Past Medical History:  Diagnosis Date  . Anginal pain ([place])    a. NL cath in 2008;  b. Myoview 03/2011: dec uptake along mid anterior wall on stress imaging -> ? attenuation vs. ischemia, EF 65%;  c. Echo 04/2011: EF 55-60%, no RWMA, Gr 2 dd  . Anxiety   . Arthritis   . Asthma   . Back pain   . Bone cancer ([place])   . Cancer ([place])   . Chest pain   . CHF (congestive heart failure) ([place])   . CHF (congestive heart failure) ([place])   . Depression   . Diabetes mellitus   . Drug use   . Dyspnea    with exertion  . Dyspnea   . Frequent urination   . GERD (gastroesophageal reflux disease)   . Glaucoma   . Headache   . HLD (hyperlipidemia)   . Hypertension   . IBS (irritable bowel syndrome)   . Joint pain   . Lactose intolerance   . Leg edema   . 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 Obesity   . Palpitations   . Pneumonia 05/2016   double  . Pulmonary edema   . Pulmonary embolism ([place])    a. 2008 -> coumadin x 6 mos.  . Rheumatoid arthritis ([place])   . Sleep apnea    on CPAP 02/2018  . TIA (transient ischemic attack)   . Urinary urgency     Past Surgical History:  Procedure Laterality Date  . ABDOMINAL HYSTERECTOMY  2005  . CARDIAC CATHETERIZATION     Normal  . CARDIAC CATHETERIZATION N/A 09/30/2014   Procedure: Left Heart Cath and Coronary Angiography;  Surgeon: Sherren Mocha, MD;  Location: [facility];  Service: Cardiovascular;   Laterality: N/A;  . LAPAROSCOPIC APPENDECTOMY N/A 06/03/2012   Procedure: APPENDECTOMY LAPAROSCOPIC;  Surgeon: Stark Klein, MD;  Location: [facility];  Service: General;  Laterality: N/A;  . Left knee surgery  2008  . LEG SURGERY    . TONSILLECTOMY    . TOTAL KNEE ARTHROPLASTY Left 08/22/2016   Procedure: TOTAL KNEE ARTHROPLASTY;  Surgeon: Meredith Pel, MD;  Location: [facility];  Service: Orthopedics;  Laterality: Left;  . TUBAL LIGATION  1989    There were no vitals filed for this visit.  Subjective Assessment - 04/09/18 1102    Subjective  pt amb in with SPC, verb getting out a few days ago to help unweight left side ( this PTA adjusted cane to correct height)    Currently in Pain?  Yes    Pain Score  6     Pain Location  Back    Pain Orientation  Lower;Left                       [facility] Adult PT Treatment/Exercise -  04/09/18 0001      Lumbar Exercises: Aerobic   Nustep  L 4 6 min   stopped 2 times for quick rest     Lumbar Exercises: Standing   Other Standing Lumbar Exercises  3# hip ext and abd 10 reps each      Lumbar Exercises: Seated   Other Seated Lumbar Exercises  pelvic ROM on dyna disc 15 reps plus LE ther ex 3#   red tband scap stab on dyna disc   Other Seated Lumbar Exercises  hip abd ball squeeze 15 reps 3 sec hold      Lumbar Exercises: Supine   Ab Set  15 reps;3 seconds   seated     Modalities   Modalities  Electrical Stimulation;Moist Heat      Moist Heat Therapy   Number Minutes Moist Heat  15 Minutes    Moist Heat Location  Lumbar Spine;Hip      Electrical Stimulation   Electrical Stimulation Location  left low back into the left lateral hip    Electrical Stimulation Action  IFC    Electrical Stimulation Parameters  supine    Electrical Stimulation Goals  Pain               PT Short Term Goals - 04/09/18 1122      PT SHORT TERM GOAL #1   Title  independent with initial HEP    Status  Achieved        PT Long Term Goals  - 04/09/18 1122      PT LONG TERM GOAL #1   Title  understand posture and body mechanics    Status  On-going      PT LONG TERM GOAL #2   Title  decrease pain 50%    Status  On-going      PT LONG TERM GOAL #3   Title  increase lumbar ROM 25%    Status  On-going      PT LONG TERM GOAL #4   Title  report able to sleep without difficulty    Status  On-going            Plan - 04/09/18 1122    Clinical Impression Statement  STG met. Pt tolerated ther ex progression well.Cued to activate core with ex . Amb with cane for support/safety- adjusted to correct height.     PT Treatment/Interventions  ADLs/Self Care Home Management;Electrical Stimulation;Moist Heat;Ultrasound;Therapeutic exercise;Therapeutic activities;Functional mobility training;Patient/family education;Manual techniques    PT Next Visit Plan  Progress as pain will allow       Patient will benefit from skilled therapeutic intervention in order to improve the following deficits and impairments:  Abnormal gait, Decreased range of motion, Difficulty walking, Increased muscle spasms, Decreased activity tolerance, Pain, Improper body mechanics, Impaired flexibility, Decreased strength, Decreased mobility, Postural dysfunction  Visit Diagnosis: Acute bilateral low back pain with left-sided sciatica  Muscle spasm of back  Difficulty in walking, not elsewhere classified  Muscle weakness (generalized)     Problem List Patient Active Problem List   Diagnosis Date Noted  . Other fatigue 11/20/2017  . Shortness of breath on exertion 11/20/2017  . Type 2 diabetes mellitus without complication, without long-term current use of insulin ([place]) 11/20/2017  . Vitamin D deficiency 11/20/2017  . Depression 11/20/2017  . Other hyperlipidemia 11/20/2017  . 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 Presence of left artificial knee joint 09/20/2016  . Arthritis of knee 08/22/2016  . Cervical radiculopathy 07/26/2016  .  Cervical  disc disorder with radiculopathy 07/26/2016  . Right arm pain 06/29/2016  . Cervicalgia 06/29/2016  . Primary osteoarthritis of left knee 06/29/2016  . Hypersomnia with sleep apnea 11/18/2015  . Lethargy 11/18/2015  . Chest pain 09/30/2014  . Abnormal cardiac function test 09/29/2014  . Chest pain with moderate risk for cardiac etiology 09/28/2014  . HTN (hypertension) 09/28/2014  . Hypokalemia   . Paresthesia 06/18/2014  . Numbness and tingling of left arm and leg   . Unstable angina ([place]) 05/24/2014  . OSA on CPAP 05/24/2014  . Diabetes mellitus type 2 in obese ([place]) 10/29/2012  . S/P laparoscopic appendectomy 06/04/2012  . Diastolic CHF ([place]) 30/74/6002  . Mediastinal mass 01/09/2012  . SOB (shortness of breath) 06/20/2011  . Mediastinal abnormality 06/20/2011  . Dyslipidemia 12/23/2009  . GLAUCOMA 12/23/2009  . ARTHRITIS 12/23/2009  . 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 Morbid obesity-BMI 45 09/13/2006  . ANXIETY STATE NOS 09/13/2006  . DISORDER, DEPRESSIVE NEC 09/13/2006  . CARPAL TUNNEL SYNDROME, MILD 09/13/2006  . Unspecified essential hypertension 09/13/2006  . IBS 09/13/2006  . DEGENERATION, LUMBAR/LUMBOSACRAL DISC 09/13/2006  . SYMPTOM, SWELLING/MASS/LUMP IN CHEST 09/13/2006  . PULMONARY EMBOLISM, HX OF 09/13/2006    Daeja Helderman,ANGIE PTA 04/09/2018, 11:46 AM  [address] [place], [place], [address] Phone: [phone]   Fax:  [phone]  Name: [name] MRN: [id number] Date of Birth: [birth year]

## 2018-04-12 ENCOUNTER — Ambulatory Visit: Payer: Medicare Other | Admitting: Physical Therapy

## 2018-04-12 DIAGNOSIS — M62830 Muscle spasm of back: Secondary | ICD-10-CM

## 2018-04-12 DIAGNOSIS — R262 Difficulty in walking, not elsewhere classified: Secondary | ICD-10-CM

## 2018-04-12 DIAGNOSIS — M6281 Muscle weakness (generalized): Secondary | ICD-10-CM

## 2018-04-12 DIAGNOSIS — M5442 Lumbago with sciatica, left side: Secondary | ICD-10-CM

## 2018-04-12 NOTE — Therapy (Signed)
[address] [place], [place], [address] Phone: [phone]   Fax:  [phone]  Physical Therapy Treatment  Patient Details  Name: [name] MRN: [id number] Date of Birth: [birth year] Referring Provider (PT): Jerry Caras Date: 04/12/2018  PT End of Session - 04/12/18 1034    Visit Number  4    Date for PT Re-Evaluation  05/28/18    PT Start Time  1030    PT Stop Time  1110    PT Time Calculation (min)  40 min       Past Medical History:  Diagnosis Date  . Anginal pain ([place])    a. NL cath in 2008;  b. Myoview 03/2011: dec uptake along mid anterior wall on stress imaging -> ? attenuation vs. ischemia, EF 65%;  c. Echo 04/2011: EF 55-60%, no RWMA, Gr 2 dd  . Anxiety   . Arthritis   . Asthma   . Back pain   . Bone cancer ([place])   . Cancer ([place])   . Chest pain   . CHF (congestive heart failure) ([place])   . CHF (congestive heart failure) ([place])   . Depression   . Diabetes mellitus   . Drug use   . Dyspnea    with exertion  . Dyspnea   . Frequent urination   . GERD (gastroesophageal reflux disease)   . Glaucoma   . Headache   . HLD (hyperlipidemia)   . Hypertension   . IBS (irritable bowel syndrome)   . Joint pain   . Lactose intolerance   . Leg edema   . 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 Obesity   . Palpitations   . Pneumonia 05/2016   double  . Pulmonary edema   . Pulmonary embolism ([place])    a. 2008 -> coumadin x 6 mos.  . Rheumatoid arthritis ([place])   . Sleep apnea    on CPAP 02/2018  . TIA (transient ischemic attack)   . Urinary urgency     Past Surgical History:  Procedure Laterality Date  . ABDOMINAL HYSTERECTOMY  2005  . CARDIAC CATHETERIZATION     Normal  . CARDIAC CATHETERIZATION N/A 09/30/2014   Procedure: Left Heart Cath and Coronary Angiography;  Surgeon: Sherren Mocha, MD;  Location: [facility];  Service: Cardiovascular;   Laterality: N/A;  . LAPAROSCOPIC APPENDECTOMY N/A 06/03/2012   Procedure: APPENDECTOMY LAPAROSCOPIC;  Surgeon: Stark Klein, MD;  Location: [facility];  Service: General;  Laterality: N/A;  . Left knee surgery  2008  . LEG SURGERY    . TONSILLECTOMY    . TOTAL KNEE ARTHROPLASTY Left 08/22/2016   Procedure: TOTAL KNEE ARTHROPLASTY;  Surgeon: Meredith Pel, MD;  Location: [facility];  Service: Orthopedics;  Laterality: Left;  . TUBAL LIGATION  1989    There were no vitals filed for this visit.  Subjective Assessment - 04/12/18 1031    Subjective  pt 15 min late. amb in without AD. trendemlumburg antalgic gait    Currently in Pain?  Yes    Pain Score  8     Pain Location  Back                       [facility] Adult PT Treatment/Exercise - 04/12/18 0001      Lumbar Exercises: Aerobic   Nustep  L 4 6 min  Lumbar Exercises: Standing   Other Standing Lumbar Exercises  red tband hip ext and abd 10 reps each   red tband trunk ext 10 reps   Other Standing Lumbar Exercises  red tband scap stab 10 times 4 way      Modalities   Modalities  Electrical Stimulation;Moist Heat      Moist Heat Therapy   Number Minutes Moist Heat  15 Minutes    Moist Heat Location  Lumbar Spine;Hip      Electrical Stimulation   Electrical Stimulation Location  left low back into the left lateral hip    Electrical Stimulation Action  IFC    Electrical Stimulation Parameters  supine    Electrical Stimulation Goals  Pain               PT Short Term Goals - 04/09/18 1122      PT SHORT TERM GOAL #1   Title  independent with initial HEP    Status  Achieved        PT Long Term Goals - 04/09/18 1122      PT LONG TERM GOAL #1   Title  understand posture and body mechanics    Status  On-going      PT LONG TERM GOAL #2   Title  decrease pain 50%    Status  On-going      PT LONG TERM GOAL #3   Title  increase lumbar ROM 25%    Status  On-going      PT LONG TERM GOAL #4   Title   report able to sleep without difficulty    Status  On-going            Plan - 04/12/18 1034    Clinical Impression Statement  pt 15 min late so adjusted tx accorningly. tolerated ther ex fair- cuing needed for postrue and rest needed for pain and fatigue    PT Next Visit Plan  Progress as pain will allow       Patient will benefit from skilled therapeutic intervention in order to improve the following deficits and impairments:  Abnormal gait, Decreased range of motion, Difficulty walking, Increased muscle spasms, Decreased activity tolerance, Pain, Improper body mechanics, Impaired flexibility, Decreased strength, Decreased mobility, Postural dysfunction  Visit Diagnosis: Acute bilateral low back pain with left-sided sciatica  Muscle spasm of back  Difficulty in walking, not elsewhere classified  Muscle weakness (generalized)     Problem List Patient Active Problem List   Diagnosis Date Noted  . Other fatigue 11/20/2017  . Shortness of breath on exertion 11/20/2017  . Type 2 diabetes mellitus without complication, without long-term current use of insulin ([place]) 11/20/2017  . Vitamin D deficiency 11/20/2017  . Depression 11/20/2017  . Other hyperlipidemia 11/20/2017  . 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 Presence of left artificial knee joint 09/20/2016  . Arthritis of knee 08/22/2016  . Cervical radiculopathy 07/26/2016  . Cervical disc disorder with radiculopathy 07/26/2016  . Right arm pain 06/29/2016  . Cervicalgia 06/29/2016  . Primary osteoarthritis of left knee 06/29/2016  . Hypersomnia with sleep apnea 11/18/2015  . Lethargy 11/18/2015  . Chest pain 09/30/2014  . Abnormal cardiac function test 09/29/2014  . Chest pain with moderate risk for cardiac etiology 09/28/2014  . HTN (hypertension) 09/28/2014  . Hypokalemia   . Paresthesia 06/18/2014  . Numbness and tingling of left arm and leg   . Unstable angina ([place]) 05/24/2014  . OSA on CPAP 05/24/2014   .  Diabetes mellitus type 2 in obese ([place]) 10/29/2012  . S/P laparoscopic appendectomy 06/04/2012  . Diastolic CHF ([place]) 50/12/3816  . Mediastinal mass 01/09/2012  . SOB (shortness of breath) 06/20/2011  . Mediastinal abnormality 06/20/2011  . Dyslipidemia 12/23/2009  . GLAUCOMA 12/23/2009  . ARTHRITIS 12/23/2009  . 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 Morbid obesity-BMI 45 09/13/2006  . ANXIETY STATE NOS 09/13/2006  . DISORDER, DEPRESSIVE NEC 09/13/2006  . CARPAL TUNNEL SYNDROME, MILD 09/13/2006  . Unspecified essential hypertension 09/13/2006  . IBS 09/13/2006  . DEGENERATION, LUMBAR/LUMBOSACRAL DISC 09/13/2006  . SYMPTOM, SWELLING/MASS/LUMP IN CHEST 09/13/2006  . PULMONARY EMBOLISM, HX OF 09/13/2006    Morrisa Aldaba,ANGIE PTA 04/12/2018, 10:36 AM  [address] [place], [place], [address] Phone: [phone]   Fax:  [phone]  Name: [name] MRN: [id number] Date of Birth: 01-20-1961

## 2018-04-15 ENCOUNTER — Ambulatory Visit (INDEPENDENT_AMBULATORY_CARE_PROVIDER_SITE_OTHER): Payer: Medicare Other | Admitting: Family Medicine

## 2018-04-15 ENCOUNTER — Encounter (INDEPENDENT_AMBULATORY_CARE_PROVIDER_SITE_OTHER): Payer: Self-pay | Admitting: Family Medicine

## 2018-04-15 VITALS — BP 139/78 | HR 79 | Temp 97.7°F | Ht <= 58 in | Wt 206.0 lb

## 2018-04-15 DIAGNOSIS — Z6841 Body Mass Index (BMI) 40.0 and over, adult: Secondary | ICD-10-CM | POA: Diagnosis not present

## 2018-04-15 DIAGNOSIS — F3289 Other specified depressive episodes: Secondary | ICD-10-CM | POA: Diagnosis not present

## 2018-04-15 DIAGNOSIS — E119 Type 2 diabetes mellitus without complications: Secondary | ICD-10-CM | POA: Diagnosis not present

## 2018-04-15 DIAGNOSIS — E66813 Obesity, class 3: Secondary | ICD-10-CM

## 2018-04-16 NOTE — Progress Notes (Signed)
Office: [phone]  /  Fax: [phone]   HPI:   Chief Complaint: OBESITY [name] is here to discuss her progress with her obesity treatment plan. She is on the  follow the Category 2 plan and is following her eating plan approximately 80 % of the time. She states she is going to PT for 60 minutes 2 times per week. [name] has been experiencing a lot of stress and is sleeping too much. Has a lack of appetite and is experiencing hair loss.  Her weight is 206 lb (93.4 kg) today and has gained 1 lb  since her last visit. She has lost 20 lbs since starting treatment with [name].  Diabetes II [name] has a diagnosis of diabetes type II which is well-controlled. [name] states BGs range between 86 and 112 . Her post prandial range is 97-160.  She denies any hypoglycemic episodes. Last A1c was Hemoglobin A1C Latest Ref Rng & Units 03/04/2018 11/20/2017  HGBA1C 4.8 - 5.6 % 6.8(H) 8.8(H)  Some recent data might be hidden    She has been working on intensive lifestyle modifications including diet, exercise, and weight loss to help control her blood glucose levels. She is on Saxaglipitin-Metformin combo.   Depression  [name] is struggling with depression which is causing lack of appetite. She denies suicidal or homicidal ideation.  She has an appointment to see a counselor as she is having a lot of family stress with her daughter. She is currently taking Zoloft.   Depression screen [facility] 2/9 11/20/2017 09/10/2013  Decreased Interest 1 1  Down, Depressed, Hopeless 1 1  PHQ - 2 Score 2 2  Altered sleeping 1 -  Tired, decreased energy 2 -  Change in appetite 3 -  Feeling bad or failure about yourself  1 -  Trouble concentrating 1 -  Moving slowly or fidgety/restless 1 -  Suicidal thoughts 0 -  PHQ-9 Score 11 -  Difficult doing work/chores Somewhat difficult -  Some recent data might be hidden     ASSESSMENT AND PLAN:  Type 2 diabetes mellitus without complication, without long-term current use of  insulin (HCC)  Other depression  Class 3 severe obesity with serious comorbidity and body mass index (BMI) of 40.0 to 44.9 in adult, unspecified obesity type ([place])  PLAN:  Diabetes II Ying has been given extensive diabetes education by myself today including ideal fasting and post-prandial blood glucose readings, individual ideal HgA1c goals  and hypoglycemia prevention. We discussed the importance of good blood sugar control to decrease the likelihood of diabetic complications such as nephropathy, neuropathy, limb loss, blindness, coronary artery disease, and death. We discussed the importance of intensive lifestyle modification including diet, exercise and weight loss as the first line treatment for diabetes. Shena agrees to continue her diabetes medications and will follow up at the agreed upon time. Will recheck her A1C in 6 weeks.   Depression with Emotional Eating Behaviors We discussed behavior modification techniques today to help [name] deal with her emotional eating and depression. Will continue Zoloft and see her counselor as scheduled.   Obesity [name] is currently in the action stage of change. As such, her goal is to continue with weight loss efforts She has agreed to follow the Category 2 plan Tiye will continue current exercise regimen for weight loss and overall health benefits. We discussed the following Behavioral Modification Stratagies today: increasing lean protein intake, and planning for success.    [name] has agreed to follow up with our clinic in  3 weeks. She was informed of the importance of frequent follow up visits to maximize her success with intensive lifestyle modifications for her multiple health conditions.  ALLERGIES: Allergies  Allergen Reactions  . Penicillins Swelling    Has patient had a PCN reaction causing immediate rash, facial/tongue/throat swelling, SOB or lightheadedness with hypotension: YES Has patient had a PCN reaction causing severe  rash involving mucus membranes or skin necrosis: NO Has patient had a PCN reaction that required hospitalization NO Has patient had a PCN reaction occurring within the last 10 years: NO If all of the above answers are "NO", then may proceed with Cephalosporin use.  [name] Hydrocodone-Acetaminophen Other (See Comments)    confusion  . Latex Rash  . Sulfa Antibiotics Diarrhea, Itching and Rash    MEDICATIONS: Current Outpatient Medications on File Prior to Visit  Medication Sig Dispense Refill  . amLODipine (NORVASC) 5 MG tablet Take 5 mg by mouth daily.  6  . aspirin EC 81 MG tablet Take 81 mg by mouth daily.    [name] atorvastatin (LIPITOR) 40 MG tablet Take 40 mg by mouth at bedtime.     . carvedilol (COREG CR) 10 MG 24 hr capsule Take 10 mg by mouth daily.    . cyclobenzaprine (FLEXERIL) 5 MG tablet Take 1 tablet (5 mg total) by mouth at bedtime. 15 tablet 0  . diclofenac sodium (VOLTAREN) 1 % GEL Apply 4 g topically 4 (four) times daily. 100 g 0  . furosemide (LASIX) 40 MG tablet Take 40 mg by mouth daily.    [name] gabapentin (NEURONTIN) 300 MG capsule Take 2 capsules (600 mg total) by mouth 3 (three) times daily. 180 capsule 2  . HYDROcodone-acetaminophen (NORCO/VICODIN) 5-325 MG tablet Take 1 tablet by mouth every 4 (four) hours as needed. 15 tablet 0  . isosorbide dinitrate (ISORDIL) 20 MG tablet Take 20 mg by mouth 2 (two) times daily.     [name] KLOR-CON M10 10 MEQ tablet Take 10 mEq by mouth 2 (two) times daily.     [name] KOMBIGLYZE XR 5-500 MG TB24 Take 1 tablet by mouth every morning.     . lidocaine (LIDODERM) 5 % Place 1 patch onto the skin daily. Remove & Discard patch within 12 hours or as directed by MD 15 patch 0  . lidocaine (LIDODERM) 5 % Place 1 patch onto the skin daily. Remove & Discard patch within 12 hours or as directed by MD 30 patch 0  . lisinopril (PRINIVIL,ZESTRIL) 20 MG tablet Take 20 mg by mouth daily.     [name] loperamide (IMODIUM A-D) 2 MG tablet Take 2 mg by mouth 4 (four) times  daily as needed for diarrhea or loose stools.    . meloxicam (MOBIC) 15 MG tablet TAKE 1 TABLET BY MOUTH EVERY DAY X2 WEEKS 30 tablet 0  . MYRBETRIQ 25 MG TB24 tablet Take 25 mg by mouth daily.  5  . omeprazole (PRILOSEC) 20 MG capsule Take 20 mg by mouth 2 (two) times daily.    . sertraline (ZOLOFT) 100 MG tablet Take 100 mg by mouth daily.     . Vitamin D, Ergocalciferol, (DRISDOL) 1.25 MG (50000 UT) CAPS capsule Take 1 capsule (50,000 Units total) by mouth every 7 (seven) days. 4 capsule 0   No current facility-administered medications on file prior to visit.     PAST MEDICAL HISTORY: Past Medical History:  Diagnosis Date  . Anginal pain ([place])    a. NL cath in 2008;  b.  Myoview 03/2011: dec uptake along mid anterior wall on stress imaging -> ? attenuation vs. ischemia, EF 65%;  c. Echo 04/2011: EF 55-60%, no RWMA, Gr 2 dd  . Anxiety   . Arthritis   . Asthma   . Back pain   . Bone cancer ([place])   . Cancer ([place])   . Chest pain   . CHF (congestive heart failure) ([place])   . CHF (congestive heart failure) ([place])   . Depression   . Diabetes mellitus   . Drug use   . Dyspnea    with exertion  . Dyspnea   . Frequent urination   . GERD (gastroesophageal reflux disease)   . Glaucoma   . Headache   . HLD (hyperlipidemia)   . Hypertension   . IBS (irritable bowel syndrome)   . Joint pain   . Lactose intolerance   . Leg edema   . 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 Obesity   . Palpitations   . Pneumonia 05/2016   double  . Pulmonary edema   . Pulmonary embolism ([place])    a. 2008 -> coumadin x 6 mos.  . Rheumatoid arthritis ([place])   . Sleep apnea    on CPAP 02/2018  . TIA (transient ischemic attack)   . Urinary urgency     PAST SURGICAL HISTORY: Past Surgical History:  Procedure Laterality Date  . ABDOMINAL HYSTERECTOMY  2005  . CARDIAC CATHETERIZATION     Normal  . CARDIAC CATHETERIZATION N/A 09/30/2014   Procedure: Left Heart Cath and Coronary  Angiography;  Surgeon: Sherren Mocha, MD;  Location: [facility];  Service: Cardiovascular;  Laterality: N/A;  . LAPAROSCOPIC APPENDECTOMY N/A 06/03/2012   Procedure: APPENDECTOMY LAPAROSCOPIC;  Surgeon: Stark Klein, MD;  Location: [facility];  Service: General;  Laterality: N/A;  . Left knee surgery  2008  . LEG SURGERY    . TONSILLECTOMY    . TOTAL KNEE ARTHROPLASTY Left 08/22/2016   Procedure: TOTAL KNEE ARTHROPLASTY;  Surgeon: Meredith Pel, MD;  Location: [facility];  Service: Orthopedics;  Laterality: Left;  . TUBAL LIGATION  1989    SOCIAL HISTORY: Social History   Tobacco Use  . Smoking status: Former Smoker    Packs/day: 0.50    Years: 15.00    Pack years: 7.50    Types: Cigarettes    Last attempt to quit: 04/24/1985    Years since quitting: 33.0  . Smokeless tobacco: Never Used  Substance Use Topics  . Alcohol use: No    Alcohol/week: 0.0 standard drinks  . Drug use: Yes    Types: Marijuana    FAMILY HISTORY: Family History  Problem Relation Age of Onset  . Emphysema Mother   . Arthritis Mother   . Heart failure Mother        alive @ 3  . Stroke Mother   . Diabetes Mother   . Hypertension Mother   . Hyperlipidemia Mother   . Depression Mother   . Anxiety disorder Mother   . Asthma Brother   . Heart disease Father        died @ 71's.  . Stroke Father   . Diabetes Father   . Hyperlipidemia Father   . Hypertension Father   . Bipolar disorder Father   . Sleep apnea Father   . Alcoholism Father   . Drug abuse Father   . Diabetes Sister   . Heart disease Paternal Grandfather   .  Colon cancer Maternal Grandfather   . Breast cancer Maternal Aunt     ROS: Review of Systems  Endo/Heme/Allergies:       Positive for hair loss. Negative for polyphagia or hypoglycemia.   Psychiatric/Behavioral: Positive for depression. Negative for suicidal ideas. The patient is nervous/anxious.        Denies suicidal or homicidal ideation.     PHYSICAL EXAM: Blood  pressure 139/78, pulse 79, temperature 97.7 F (36.5 C), temperature source Oral, height 4\' 10"  (1.473 m), weight 206 lb (93.4 kg), SpO2 97 %. Body mass index is 43.05 kg/m. Physical Exam Vitals signs reviewed.  HENT:     Head: Normocephalic.  Eyes:     Extraocular Movements: Extraocular movements intact.  Neck:     Musculoskeletal: Normal range of motion.  Cardiovascular:     Rate and Rhythm: Normal rate.  Pulmonary:     Effort: Pulmonary effort is normal.  Musculoskeletal: Normal range of motion.  Skin:    General: Skin is warm.  Neurological:     Mental Status: She is alert and oriented to person, place, and time.     RECENT LABS AND TESTS: BMET    Component Value Date/Time   NA 144 03/04/2018 1152   K 3.8 03/04/2018 1152   CL 101 03/04/2018 1152   CO2 25 03/04/2018 1152   GLUCOSE 123 (H) 03/04/2018 1152   GLUCOSE 152 (H) 07/27/2017 2248   BUN 17 03/04/2018 1152   CREATININE 0.91 03/04/2018 1152   CALCIUM 9.5 03/04/2018 1152   GFRNONAA 70 03/04/2018 1152   GFRAA 80 03/04/2018 1152   Lab Results  Component Value Date   HGBA1C 6.8 (H) 03/04/2018   HGBA1C 8.8 (H) 11/20/2017   HGBA1C 7.4 (H) 08/11/2016   HGBA1C 6.6 (H) 05/21/2016   HGBA1C 6.2 (H) 10/28/2012   Lab Results  Component Value Date   INSULIN 20.2 03/04/2018   INSULIN 53.9 (H) 11/20/2017   CBC    Component Value Date/Time   WBC 6.5 11/20/2017 0925   WBC 9.5 07/27/2017 2248   RBC 4.78 11/20/2017 0925   RBC 4.87 07/27/2017 2248   HGB 12.8 11/20/2017 0925   HCT 40.6 11/20/2017 0925   PLT 200 07/27/2017 2248   MCV 85 11/20/2017 0925   MCH 26.8 11/20/2017 0925   MCH 28.1 07/27/2017 2248   MCHC 31.5 11/20/2017 0925   MCHC 33.6 07/27/2017 2248   RDW 13.9 11/20/2017 0925   LYMPHSABS 2.1 11/20/2017 0925   MONOABS 0.2 05/21/2016 0800   EOSABS 0.4 11/20/2017 0925   BASOSABS 0.0 11/20/2017 0925   Iron/TIBC/Ferritin/ %Sat No results found for: IRON, TIBC, FERRITIN, IRONPCTSAT Lipid Panel       Component Value Date/Time   CHOL 162 03/04/2018 1152   TRIG 117 03/04/2018 1152   HDL 50 03/04/2018 1152   CHOLHDL 4.2 04/23/2011 0830   CHOLHDL 4.3 04/23/2011 0830   VLDL 29 04/23/2011 0830   VLDL 29 04/23/2011 0830   LDLCALC 89 03/04/2018 1152   Hepatic Function Panel     Component Value Date/Time   PROT 6.4 03/04/2018 1152   ALBUMIN 4.1 03/04/2018 1152   AST 19 03/04/2018 1152   ALT 40 (H) 03/04/2018 1152   ALKPHOS 61 03/04/2018 1152   BILITOT 0.4 03/04/2018 1152   BILIDIR 0.1 05/22/2016 0913   IBILI 0.6 05/22/2016 0913      Component Value Date/Time   TSH 0.638 11/20/2017 0925   TSH 0.112 (L) 05/24/2016 0629   TSH 0.345 (L)  06/18/2014 0930   TSH 0.167 (L) 10/28/2012 0144      OBESITY BEHAVIORAL INTERVENTION VISIT  Today's visit was # 9   Starting weight: 225 lbs Starting date: 11/20/17 Today's weight : Weight: 206 lb (93.4 kg)  Today's date: 04/15/18 Total lbs lost to date: 20 At least 15 minutes were spent on discussing the following behavioral intervention visit.   ASK: We discussed the diagnosis of obesity with Edwyna Ready today and Vinie agreed to give [name] permission to discuss obesity behavioral modification therapy today.  ASSESS: Jennife has the diagnosis of obesity and her BMI today is 43.2 Ekta is in the action stage of change   ADVISE: Blu was educated on the multiple health risks of obesity as well as the benefit of weight loss to improve her health. She was advised of the need for long term treatment and the importance of lifestyle modifications to improve her current health and to decrease her risk of future health problems.  AGREE: Multiple dietary modification options and treatment options were discussed and  [place] agreed to follow the recommendations documented in the above note.  ARRANGE: Vaeda was educated on the importance of frequent visits to treat obesity as outlined per CMS and USPSTF guidelines and agreed to schedule  her next follow up appointment today.  I, April Moore, CMA, am acting as Location manager for [employer], [employer].  I have reviewed the above documentation for accuracy and completeness, and I agree with the above.  - Sarit Sparano, FNP-C.

## 2018-04-18 ENCOUNTER — Telehealth (INDEPENDENT_AMBULATORY_CARE_PROVIDER_SITE_OTHER): Payer: Self-pay | Admitting: Orthopedic Surgery

## 2018-04-18 MED ORDER — HYDROCODONE-ACETAMINOPHEN 5-325 MG PO TABS: 1.0000 | ORAL_TABLET | Freq: Two times a day (BID) | ORAL | 0 refills | Status: DC | PRN

## 2018-04-18 NOTE — Telephone Encounter (Signed)
Can you advise on this one?  Dean patient.

## 2018-04-18 NOTE — Telephone Encounter (Signed)
I sent it in 

## 2018-04-18 NOTE — Addendum Note (Signed)
Addended by: [name] Cecil on: 04/18/2018 05:22 PM   Modules accepted: Orders

## 2018-04-18 NOTE — Telephone Encounter (Signed)
[name] called in today to make an appointment with Dr. Marlou Sa for increasing groin pain.  She will be seeing him on 04/26/2018.  She states that she is so much pain that she will need an Rx for pain in the meantime.  She has taken hydrocodone in the past and that has helped her. [facility] uses [facility] on [address].

## 2018-04-22 ENCOUNTER — Encounter (INDEPENDENT_AMBULATORY_CARE_PROVIDER_SITE_OTHER): Payer: Self-pay | Admitting: Family Medicine

## 2018-04-25 ENCOUNTER — Ambulatory Visit: Payer: Medicare Other | Attending: Orthopedic Surgery | Admitting: Physical Therapy

## 2018-04-26 ENCOUNTER — Ambulatory Visit (INDEPENDENT_AMBULATORY_CARE_PROVIDER_SITE_OTHER): Payer: Medicare Other | Admitting: Orthopedic Surgery

## 2018-04-27 ENCOUNTER — Emergency Department (HOSPITAL_COMMUNITY)
Admission: EM | Admit: 2018-04-27 | Discharge: 2018-04-28 | Disposition: A | Discharge status: Home / Self Care | Payer: Medicare Other | Attending: Emergency Medicine | Admitting: Emergency Medicine

## 2018-04-27 ENCOUNTER — Emergency Department (HOSPITAL_COMMUNITY): Payer: Medicare Other

## 2018-04-27 ENCOUNTER — Encounter (HOSPITAL_COMMUNITY): Payer: Self-pay

## 2018-04-27 DIAGNOSIS — Z7982 Long term (current) use of aspirin: Secondary | ICD-10-CM | POA: Insufficient documentation

## 2018-04-27 DIAGNOSIS — E119 Type 2 diabetes mellitus without complications: Secondary | ICD-10-CM | POA: Diagnosis not present

## 2018-04-27 DIAGNOSIS — J45909 Unspecified asthma, uncomplicated: Secondary | ICD-10-CM | POA: Insufficient documentation

## 2018-04-27 DIAGNOSIS — Z79899 Other long term (current) drug therapy: Secondary | ICD-10-CM | POA: Diagnosis not present

## 2018-04-27 DIAGNOSIS — J069 Acute upper respiratory infection, unspecified: Secondary | ICD-10-CM | POA: Diagnosis not present

## 2018-04-27 DIAGNOSIS — I110 Hypertensive heart disease with heart failure: Secondary | ICD-10-CM | POA: Diagnosis not present

## 2018-04-27 DIAGNOSIS — I5032 Chronic diastolic (congestive) heart failure: Secondary | ICD-10-CM | POA: Insufficient documentation

## 2018-04-27 DIAGNOSIS — R079 Chest pain, unspecified: Secondary | ICD-10-CM | POA: Diagnosis present

## 2018-04-27 DIAGNOSIS — M25552 Pain in left hip: Secondary | ICD-10-CM | POA: Diagnosis not present

## 2018-04-27 DIAGNOSIS — Z87891 Personal history of nicotine dependence: Secondary | ICD-10-CM | POA: Diagnosis not present

## 2018-04-27 DIAGNOSIS — B9789 Other viral agents as the cause of diseases classified elsewhere: Secondary | ICD-10-CM

## 2018-04-27 LAB — CBC
HCT: 43.1 % (ref 36.0–46.0)
Hemoglobin: 13.1 g/dL (ref 12.0–15.0)
MCH: 26.7 pg (ref 26.0–34.0)
MCHC: 30.4 g/dL (ref 30.0–36.0)
MCV: 88 fL (ref 80.0–100.0)
Platelets: 253 10*3/uL (ref 150–400)
RBC: 4.9 MIL/uL (ref 3.87–5.11)
RDW: 14.4 % (ref 11.5–15.5)
WBC: 6.8 10*3/uL (ref 4.0–10.5)
nRBC: 0 % (ref 0.0–0.2)

## 2018-04-27 LAB — BASIC METABOLIC PANEL
Anion gap: 9 (ref 5–15)
BUN: 13 mg/dL (ref 6–20)
CO2: 25 mmol/L (ref 22–32)
Calcium: 9.2 mg/dL (ref 8.9–10.3)
Chloride: 105 mmol/L (ref 98–111)
Creatinine, Ser: 0.84 mg/dL (ref 0.44–1.00)
GFR calc Af Amer: >60 mL/min (ref >60)
GFR calc non Af Amer: >60 mL/min (ref >60)
Glucose, Bld: 95 mg/dL (ref 70–99)
Potassium: 3.4 mmol/L — ABNORMAL LOW (ref 3.5–5.1)
Sodium: 139 mmol/L (ref 135–145)

## 2018-04-27 LAB — I-STAT TROPONIN, ED: Troponin i, poc: 0.02 ng/mL (ref 0.00–0.08)

## 2018-04-27 NOTE — ED Triage Notes (Signed)
Pt had a fall in Oct 2019 pain to upper left thigh, now left groin is painful.  Onset yesterday NP cough, back and chest pain.

## 2018-04-28 ENCOUNTER — Emergency Department (HOSPITAL_COMMUNITY): Payer: Medicare Other

## 2018-04-28 ENCOUNTER — Other Ambulatory Visit: Payer: Self-pay

## 2018-04-28 DIAGNOSIS — J069 Acute upper respiratory infection, unspecified: Secondary | ICD-10-CM | POA: Diagnosis not present

## 2018-04-28 LAB — URINALYSIS, ROUTINE W REFLEX MICROSCOPIC
Bilirubin Urine: NEGATIVE
Glucose, UA: NEGATIVE mg/dL
Hgb urine dipstick: NEGATIVE
Ketones, ur: NEGATIVE mg/dL
Leukocytes, UA: NEGATIVE
Nitrite: NEGATIVE
Protein, ur: NEGATIVE mg/dL
Specific Gravity, Urine: 1.027 (ref 1.005–1.030)
pH: 5 (ref 5.0–8.0)

## 2018-04-28 LAB — INFLUENZA PANEL BY PCR (TYPE A & B)
Influenza A By PCR: NEGATIVE
Influenza B By PCR: NEGATIVE

## 2018-04-28 LAB — I-STAT TROPONIN, ED: Troponin i, poc: 0.06 ng/mL (ref 0.00–0.08)

## 2018-04-28 MED ORDER — BENZONATATE 100 MG PO CAPS: 100.0000 mg | ORAL_CAPSULE | Freq: Three times a day (TID) | ORAL | 0 refills | Status: DC | PRN

## 2018-04-28 MED ORDER — ACETAMINOPHEN 500 MG PO TABS
1000.0000 mg | ORAL_TABLET | Freq: Once | ORAL | Status: AC
  Administered 2018-04-28: 1000 mg via ORAL
  Filled 2018-04-28: qty 2

## 2018-04-28 MED ORDER — IBUPROFEN 400 MG PO TABS
600.0000 mg | ORAL_TABLET | Freq: Once | ORAL | Status: AC
  Administered 2018-04-28: 600 mg via ORAL
  Filled 2018-04-28: qty 1

## 2018-04-28 MED ORDER — HYDROMORPHONE HCL 1 MG/ML IJ SOLN
0.5000 mg | Freq: Once | INTRAMUSCULAR | Status: AC
  Administered 2018-04-28: 0.5 mg via INTRAVENOUS
  Filled 2018-04-28: qty 1

## 2018-04-28 MED ORDER — DEXAMETHASONE SODIUM PHOSPHATE 10 MG/ML IJ SOLN
10.0000 mg | Freq: Once | INTRAMUSCULAR | Status: AC
  Administered 2018-04-28: 10 mg via INTRAVENOUS
  Filled 2018-04-28: qty 1

## 2018-04-28 MED ORDER — OXYCODONE-ACETAMINOPHEN 5-325 MG PO TABS: 1.0000 | ORAL_TABLET | Freq: Four times a day (QID) | ORAL | 0 refills | Status: DC | PRN

## 2018-04-28 NOTE — ED Provider Notes (Signed)
[facility]ERGENC[facility] DEPARTMENT Provider Note   CSN: [id number] Arrival date & time: 04/27/18  1954     History   Chief Complaint Chief Complaint  Patient presents with  . Chest Pain    HPI [name] is a 58 y.o. female with an extensive medical history including diabetes, hypertension, irritable bowel syndrome, history of pulmonary embolism, angina presents to the Emergency Department with several complaints.  Patient initially complains of central chest pain worse with coughing.  She reports she developed URI symptoms approximately 2 days ago including rhinorrhea, nasal congestion, postnasal drip, sore throat, cough.  She reports that her chest is very sore and coughing worsens her pain significantly.  Nothing really makes it better.  She denies shortness of breath, nausea, vomiting or diarrhea.  She does report subjective fevers at home.  No treatments prior to arrival.  No known sick contacts.  Patient did not receive a flu vaccine this year.  Distally, the patient is complaining of left groin pain.  She reports approximately 2 years ago she injured the left hip from a fall.  She reports she has always had some pain however it has progressively worsened over that time.  She reports the pain has been much more intense in the last 4-6 weeks and is causing trouble with ambulation over the last several days.  Patient is being followed by [facility].  She was recently prescribed hydrocodone by them however it made her itch therefore she stopped taking it.  She has taken Mobic with some relief but reports it no longer works.  She has not attempted Tylenol in several weeks.  Patient reports she did have a follow-up appointment with Dr. Sharol Given on Friday however she overslept and did not make this appointment.  Her next scheduled appointment with the orthopedist is May 10, 2018.  Patient denies numbness, tingling or weakness in her left leg.  She denies loss of  bowel or bladder control.  She does report movement, ambulation and palpation make her symptoms significantly worse.  Nothing makes them better at this time.  The history is provided by the patient and medical records.    Past Medical History:  Diagnosis Date  . Anginal pain ([place])    a. NL cath in 2008;  b. Myoview 03/2011: dec uptake along mid anterior wall on stress imaging -> ? attenuation vs. ischemia, EF 65%;  c. Echo 04/2011: EF 55-60%, no RWMA, Gr 2 dd  . Anxiety   . Arthritis   . Asthma   . Back pain   . Bone cancer ([place])   . Cancer ([place])   . Chest pain   . CHF (congestive heart failure) ([place])   . CHF (congestive heart failure) ([place])   . Depression   . Diabetes mellitus   . Drug use   . Dyspnea    with exertion  . Dyspnea   . Frequent urination   . GERD (gastroesophageal reflux disease)   . Glaucoma   . Headache   . HLD (hyperlipidemia)   . Hypertension   . IBS (irritable bowel syndrome)   . Joint pain   . Lactose intolerance   . Leg edema   . 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 Obesity   . Palpitations   . Pneumonia 05/2016   double  . Pulmonary edema   . Pulmonary embolism ([place])    a. 2008 -> coumadin x 6 mos.  [name]  Rheumatoid arthritis ([place])   . Sleep apnea    on CPAP 02/2018  . TIA (transient ischemic attack)   . Urinary urgency     Patient Active Problem List   Diagnosis Date Noted  . Other fatigue 11/20/2017  . Shortness of breath on exertion 11/20/2017  . Type 2 diabetes mellitus without complication, without long-term current use of insulin ([place]) 11/20/2017  . Vitamin D deficiency 11/20/2017  . Depression 11/20/2017  . Other hyperlipidemia 11/20/2017  . 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 Presence of left artificial knee joint 09/20/2016  . Arthritis of knee 08/22/2016  . Cervical radiculopathy 07/26/2016  . Cervical disc disorder with radiculopathy 07/26/2016  . Right arm pain 06/29/2016  . Cervicalgia 06/29/2016   . Primary osteoarthritis of left knee 06/29/2016  . Hypersomnia with sleep apnea 11/18/2015  . Lethargy 11/18/2015  . Chest pain 09/30/2014  . Abnormal cardiac function test 09/29/2014  . Chest pain with moderate risk for cardiac etiology 09/28/2014  . HTN (hypertension) 09/28/2014  . Hypokalemia   . Paresthesia 06/18/2014  . Numbness and tingling of left arm and leg   . Unstable angina ([place]) 05/24/2014  . OSA on CPAP 05/24/2014  . Diabetes mellitus type 2 in obese ([place]) 10/29/2012  . S/P laparoscopic appendectomy 06/04/2012  . Diastolic CHF ([place]) 54/12/8117  . Mediastinal mass 01/09/2012  . SOB (shortness of breath) 06/20/2011  . Mediastinal abnormality 06/20/2011  . Dyslipidemia 12/23/2009  . GLAUCOMA 12/23/2009  . ARTHRITIS 12/23/2009  . 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 Morbid obesity-BMI 45 09/13/2006  . ANXIETY STATE NOS 09/13/2006  . DISORDER, DEPRESSIVE NEC 09/13/2006  . CARPAL TUNNEL SYNDROME, MILD 09/13/2006  . Unspecified essential hypertension 09/13/2006  . IBS 09/13/2006  . DEGENERATION, LUMBAR/LUMBOSACRAL DISC 09/13/2006  . SYMPTOM, SWELLING/MASS/LUMP IN CHEST 09/13/2006  . PULMONARY EMBOLISM, HX OF 09/13/2006    Past Surgical History:  Procedure Laterality Date  . ABDOMINAL HYSTERECTOMY  2005  . CARDIAC CATHETERIZATION     Normal  . CARDIAC CATHETERIZATION N/A 09/30/2014   Procedure: Left Heart Cath and Coronary Angiography;  Surgeon: Sherren Mocha, MD;  Location: [facility];  Service: Cardiovascular;  Laterality: N/A;  . LAPAROSCOPIC APPENDECTOMY N/A 06/03/2012   Procedure: APPENDECTOMY LAPAROSCOPIC;  Surgeon: Stark Klein, MD;  Location: [facility];  Service: General;  Laterality: N/A;  . Left knee surgery  2008  . LEG SURGERY    . TONSILLECTOMY    . TOTAL KNEE ARTHROPLASTY Left 08/22/2016   Procedure: TOTAL KNEE ARTHROPLASTY;  Surgeon: Meredith Pel, MD;  Location: [facility];  Service: Orthopedics;  Laterality: Left;  . TUBAL LIGATION  1989     OB  History    Gravida  3   Para      Term      Preterm      AB      Living        SAB      TAB      Ectopic      Multiple      Live Births               Home Medications    Prior to Admission medications   Medication Sig Start Date End Date Taking? Authorizing Provider  amLODipine (NORVASC) 5 MG tablet Take 5 mg by mouth daily. 06/08/17   [provider]  aspirin EC 81 MG tablet Take 81 mg by mouth daily.    [provider]  atorvastatin (LIPITOR) 40  MG tablet Take 40 mg by mouth at bedtime.     [provider]  benzonatate (TESSALON PERLES) 100 MG capsule Take 1 capsule (100 mg total) by mouth 3 (three) times daily as needed for cough (cough). 04/28/18   Tewana Bohlen, Jarrett Soho, PA-C  carvedilol (COREG CR) 10 MG 24 hr capsule Take 10 mg by mouth daily.    [provider]  cyclobenzaprine (FLEXERIL) 5 MG tablet Take 1 tablet (5 mg total) by mouth at bedtime. 02/20/18   Maczis, Barth Kirks, PA-C  diclofenac sodium (VOLTAREN) 1 % GEL Apply 4 g topically 4 (four) times daily. 02/20/18   Maczis, Barth Kirks, PA-C  furosemide (LASIX) 40 MG tablet Take 40 mg by mouth daily.    Rai, Ripudeep K, MD  gabapentin (NEURONTIN) 300 MG capsule Take 2 capsules (600 mg total) by mouth 3 (three) times daily. 05/27/14   Reyne Dumas, MD  HYDROcodone-acetaminophen (NORCO) 5-325 MG tablet Take 1 tablet by mouth 2 (two) times daily as needed. 04/18/18   Leandrew Koyanagi, MD  HYDROcodone-acetaminophen (NORCO/VICODIN) 5-325 MG tablet Take 1 tablet by mouth every 4 (four) hours as needed. 08/15/17   Ward, Delice Bison, DO  isosorbide dinitrate (ISORDIL) 20 MG tablet Take 20 mg by mouth 2 (two) times daily.     [provider]  KLOR-CON M10 10 MEQ tablet Take 10 mEq by mouth 2 (two) times daily.  09/23/13   [provider]  KOMBIGLYZE XR 5-500 MG TB24 Take 1 tablet by mouth every morning.  10/11/15   [provider]  lidocaine (LIDODERM) 5 % Place 1 patch  onto the skin daily. Remove & Discard patch within 12 hours or as directed by MD 02/06/18   Suella Broad A, PA-C  lidocaine (LIDODERM) 5 % Place 1 patch onto the skin daily. Remove & Discard patch within 12 hours or as directed by MD 03/06/18   Meredith Pel, MD  lisinopril (PRINIVIL,ZESTRIL) 20 MG tablet Take 20 mg by mouth daily.  09/21/14   [provider]  loperamide (IMODIUM A-D) 2 MG tablet Take 2 mg by mouth 4 (four) times daily as needed for diarrhea or loose stools.    [provider]  meloxicam (MOBIC) 15 MG tablet TAKE 1 TABLET BY MOUTH EVERY DAY X2 WEEKS 04/01/18   Meredith Pel, MD  MYRBETRIQ 25 MG TB24 tablet Take 25 mg by mouth daily. 05/23/17   [provider]  omeprazole (PRILOSEC) 20 MG capsule Take 20 mg by mouth 2 (two) times daily. 10/05/16   [provider]  oxyCODONE-acetaminophen (PERCOCET) 5-325 MG tablet Take 1 tablet by mouth every 6 (six) hours as needed. 04/28/18   Chesley Veasey, Jarrett Soho, PA-C  sertraline (ZOLOFT) 100 MG tablet Take 100 mg by mouth daily.     [provider]  Vitamin D, Ergocalciferol, (DRISDOL) 1.25 MG (50000 UT) CAPS capsule Take 1 capsule (50,000 Units total) by mouth every 7 (seven) days. 03/25/18   [place], Joneen Boers, FNP    Family History Family History  Problem Relation Age of Onset  . Emphysema Mother   . Arthritis Mother   . Heart failure Mother        alive @ 70  . Stroke Mother   . Diabetes Mother   . Hypertension Mother   . Hyperlipidemia Mother   . Depression Mother   . Anxiety disorder Mother   . Asthma Brother   . Heart disease Father        died @  62's.  . Stroke Father   . Diabetes Father   . Hyperlipidemia Father   . Hypertension Father   . Bipolar disorder Father   . Sleep apnea Father   . Alcoholism Father   . Drug abuse Father   . Diabetes Sister   . Heart disease Paternal Grandfather   . Colon cancer Maternal Grandfather   . Breast cancer Maternal Aunt      Social History Social History   Tobacco Use  . Smoking status: Former Smoker    Packs/day: 0.50    Years: 15.00    Pack years: 7.50    Types: Cigarettes    Last attempt to quit: 04/24/1985    Years since quitting: 33.0  . Smokeless tobacco: Never Used  Substance Use Topics  . Alcohol use: No    Alcohol/week: 0.0 standard drinks  . Drug use: Yes    Types: Marijuana     Allergies   Penicillins; Hydrocodone-acetaminophen; Latex; and Sulfa antibiotics   Review of Systems Review of Systems  Constitutional: Positive for chills, fatigue and fever. Negative for appetite change.  HENT: Positive for congestion, postnasal drip, rhinorrhea, sinus pressure and sore throat. Negative for ear discharge, ear pain and mouth sores.   Eyes: Negative for visual disturbance.  Respiratory: Positive for cough, chest tightness, shortness of breath and wheezing. Negative for stridor.   Cardiovascular: Positive for chest pain. Negative for palpitations and leg swelling.  Gastrointestinal: Negative for abdominal pain, diarrhea, nausea and vomiting.  Genitourinary: Negative for dysuria, frequency, hematuria and urgency.  Musculoskeletal: Positive for arthralgias. Negative for back pain, myalgias and neck stiffness.  Skin: Negative for rash.  Neurological: Positive for headaches. Negative for syncope, light-headedness and numbness.  Hematological: Negative for adenopathy.  Psychiatric/Behavioral: The patient is not nervous/anxious.   All other systems reviewed and are negative.    Physical Exam Updated Vital Signs BP (!) 150/68   Pulse 80   Temp 99 F (37.2 C) (Oral)   Resp (!) 21   Ht 4\' 11"  (1.499 m)   Wt 92.1 kg   SpO2 97%   BMI 41.00 kg/m   Physical Exam Vitals signs and nursing note reviewed.  Constitutional:      General: She is not in acute distress.    Appearance: She is well-developed. She is not diaphoretic.  HENT:     Head: Normocephalic and atraumatic.     Right Ear:  Tympanic membrane, ear canal and external ear normal.     Left Ear: Tympanic membrane, ear canal and external ear normal.     Nose: Mucosal edema and rhinorrhea present.     Right Sinus: No maxillary sinus tenderness or frontal sinus tenderness.     Left Sinus: No maxillary sinus tenderness or frontal sinus tenderness.     Mouth/Throat:     Mouth: Mucous membranes are not pale and not cyanotic.     Pharynx: Uvula midline. No oropharyngeal exudate or posterior oropharyngeal erythema.     Tonsils: No tonsillar abscesses.  Eyes:     Conjunctiva/sclera: Conjunctivae normal.     Pupils: Pupils are equal, round, and reactive to light.  Neck:     Musculoskeletal: Full passive range of motion without pain and normal range of motion.  Cardiovascular:     Rate and Rhythm: Normal rate and regular rhythm.     Comments: Capillary refill < 3 sec Pulmonary:     Effort: Pulmonary effort is normal.     Breath sounds: Normal breath sounds.  No stridor.  Abdominal:     Palpations: Abdomen is soft.     Tenderness: There is no abdominal tenderness.  Musculoskeletal:        General: Tenderness present.     Left hip: She exhibits decreased range of motion and tenderness. She exhibits no deformity and no laceration.     Left knee: She exhibits decreased range of motion ( Due to pain in the left hip).     Left ankle: Normal.       Legs:  Lymphadenopathy:     Cervical: No cervical adenopathy.  Skin:    General: Skin is warm and dry.     Findings: No rash.     Comments: No tenting of the skin Overlying skin of the inguinal region is without erythema, induration or signs of infection.  Neurological:     Mental Status: She is alert.     Coordination: Coordination normal.     Comments: Patient intact to normal touch in the bilateral lower extremities.  Strength 5/5 in the bilateral lower extremities including dorsiflexion and plantarflexion.  Unable to strength test at the left hip due to pain.  5/5  strength in the right hip.      ED Treatments / Results  Labs (all labs ordered are listed, but only abnormal results are displayed) Labs Reviewed  BASIC METABOLIC PANEL - Abnormal; Notable for the following components:      Result Value   Potassium 3.4 (*)    All other components within normal limits  URINALYSIS, ROUTINE W REFLEX MICROSCOPIC - Abnormal; Notable for the following components:   APPearance HAZY (*)    Bacteria, UA RARE (*)    All other components within normal limits  CBC  INFLUENZA PANEL BY PCR (TYPE A & B)  I-STAT TROPONIN, ED  I-STAT TROPONIN, ED    EKG EKG Interpretation  Date/Time:  Saturday April 27 2018 21:20:54 EST Ventricular Rate:  79 PR Interval:  174 QRS Duration: 108 QT Interval:  386 QTC Calculation: 442 R Axis:   96 Text Interpretation:  Normal sinus rhythm Rightward axis Incomplete left bundle branch block ST & T wave abnormality, consider inferior ischemia Abnormal ECG similar to prior tracings Confirmed by Virgel Manifold [phone]) on 04/28/2018 5:13:45 AM   Radiology Dg Chest 2 View  Result Date: 04/27/2018 CLINICAL DATA:  Anterior central chest pain, weakness, and feeling sick since yesterday EXAM: CHEST - 2 VIEW COMPARISON:  06/10/2017 FINDINGS: Borderline enlargement of cardiac silhouette with pulmonary vascular congestion. Mediastinal contour stable. Mild chronic central peribronchial thickening. Lungs otherwise clear. No pulmonary infiltrate, pleural effusion or pneumothorax. Scattered endplate spur formation thoracic spine. IMPRESSION: Borderline enlargement of cardiac silhouette with chronic pulmonary vascular congestion. Mild chronic bronchitic changes without acute infiltrate. Electronically Signed   By: Lavonia Dana M.D.   On: 04/27/2018 21:54   Dg Hip Unilat W Or W/o Pelvis 2-3 Views Left  Result Date: 04/28/2018 CLINICAL DATA:  Status post fall, with left groin pain. Initial encounter. EXAM: DG HIP (WITH OR WITHOUT PELVIS) 2-3V LEFT  COMPARISON:  Left hip radiographs performed 08/15/2017 FINDINGS: There is no evidence of fracture or dislocation. Both femoral heads are seated within their respective acetabula. There is worsening superior joint space narrowing at the left hip, with associated sclerosis and cortical irregularity. Lucency across the left femoral neck is thought to reflect the underlying acetabulum. Sclerosis is noted along the sacroiliac joints. Mild degenerative change is noted at the lower lumbar spine. The visualized bowel  gas pattern is grossly unremarkable in appearance. IMPRESSION: 1. No evidence of fracture or dislocation. 2. Worsening superior joint space narrowing at the left hip, with associated sclerosis and cortical irregularity. Electronically Signed   By: [name] M.D.   On: 04/28/2018 03:21    Procedures Procedures (including critical care time)  Medications Ordered in ED Medications  acetaminophen (TYLENOL) tablet 1,000 mg (1,000 mg Oral Given 04/28/18 0253)  HYDROmorphone (DILAUDID) injection 0.5 mg (0.5 mg Intravenous Given 04/28/18 0529)  dexamethasone (DECADRON) injection 10 mg (10 mg Intravenous Given 04/28/18 0529)  ibuprofen (ADVIL,MOTRIN) tablet 600 mg (600 mg Oral Given 04/28/18 0529)     Initial Impression / Assessment and Plan / ED Course  I have reviewed the triage vital signs and the nursing notes.  Pertinent labs & imaging results that were available during my care of the patient were reviewed by me and considered in my medical decision making (see chart for details).  Clinical Course as of Apr 28 610  Sun Apr 28, 2018  0532 [place] Narcotic Database was accessed.  In the last 12 months, pt has had 1 prescription for controlled substances from 1 provider.       [HM]  0533 Does appear patient has taken Percocet in the past without adverse reaction.   [HM]  0609 Patient pain improved significantly after Dilaudid.  She ambulates with her cane and steady gait in the room.  She does not  require assistance.  Significantly improved range of motion of the left hip.   [HM]    Clinical Course User Index [HM] Dezaray Shibuya, Jarrett Soho, PA-C    Pt CXR negative for acute infiltrate. Patients symptoms are consistent with URI, likely viral etiology.  Influenza testing is negative discussed that antibiotics are not indicated for viral infections. Pt will be discharged with symptomatic treatment.   Left leg pain is likely secondary to worsening arthritis as noted on the x-ray.  There is no acute fracture noted.  No signs of overlying infection.  Less likely to be septic joint.  Patient given pain control and steroids here in the emergency department with improvement in her pain.  She is able to ambulate with a cane.  No trauma, gradual onset.  Patient unable to take hydrocodone as this makes her itch.  She reports she has previously taken Percocet with good relief.  Verbalizes understanding and is agreeable with plan. Pt is hemodynamically stable & in NAD prior to dc.  The patient was discussed with and seen by Dr. Wilson Singer who agrees with the treatment plan.    Final Clinical Impressions(s) / ED Diagnoses   Final diagnoses:  Viral URI with cough  Left hip pain    ED Discharge Orders         Ordered    oxyCODONE-acetaminophen (PERCOCET) 5-325 MG tablet  Every 6 hours PRN     04/28/18 0611    benzonatate (TESSALON PERLES) 100 MG capsule  3 times daily PRN     04/28/18 0611           Cherrie Franca, Jarrett Soho, PA-C 04/28/18 2426    Virgel Manifold, MD 05/01/18 1426

## 2018-04-28 NOTE — Discharge Instructions (Addendum)
1. Medications: percocet for severe breakthrough pain, tessalon for cough, usual home medications 2. Treatment: rest, drink plenty of fluids, gentle stretching of the left hip 3. Follow Up: Please followup with your primary doctor in 2-3 days for discussion of your diagnoses and further evaluation after today's visit; if you do not have a primary care doctor use the resource guide provided to find one; Please return to the ER for high fevers, persistent vomiting, worsening pain in the hip, worsening symptoms or other concerns

## 2018-04-30 ENCOUNTER — Ambulatory Visit: Payer: Medicare Other | Admitting: Physical Therapy

## 2018-05-06 ENCOUNTER — Encounter (INDEPENDENT_AMBULATORY_CARE_PROVIDER_SITE_OTHER): Payer: Self-pay | Admitting: Bariatrics

## 2018-05-06 ENCOUNTER — Ambulatory Visit (INDEPENDENT_AMBULATORY_CARE_PROVIDER_SITE_OTHER): Payer: Medicare Other | Admitting: Bariatrics

## 2018-05-06 VITALS — BP 122/74 | HR 77 | Temp 97.2°F | Ht <= 58 in | Wt 204.0 lb

## 2018-05-06 DIAGNOSIS — Z6841 Body Mass Index (BMI) 40.0 and over, adult: Secondary | ICD-10-CM

## 2018-05-06 DIAGNOSIS — L659 Nonscarring hair loss, unspecified: Secondary | ICD-10-CM | POA: Diagnosis not present

## 2018-05-06 DIAGNOSIS — E119 Type 2 diabetes mellitus without complications: Secondary | ICD-10-CM

## 2018-05-06 DIAGNOSIS — Z9189 Other specified personal risk factors, not elsewhere classified: Secondary | ICD-10-CM

## 2018-05-06 DIAGNOSIS — E559 Vitamin D deficiency, unspecified: Secondary | ICD-10-CM

## 2018-05-06 MED ORDER — VITAMIN D (ERGOCALCIFEROL) 1.25 MG (50000 UNIT) PO CAPS: 50000.0000 [IU] | ORAL_CAPSULE | ORAL | 0 refills | Status: DC

## 2018-05-07 NOTE — Progress Notes (Signed)
Office: [phone]  /  Fax: [phone]   HPI:   Chief Complaint: OBESITY [name] is here to discuss her progress with her obesity treatment plan. She is on the Category 2 plan and is following her eating plan approximately 85 % of the time. She states she is exercising 0 minutes 0 times per week. [name] id doing well overall. She had better decisions (decreasing portion size and better choices). Her weight is 204 lb (92.5 kg) today and has had a weight loss of 2 pounds over a period of 3 weeks since her last visit. She has lost 21 lbs since starting treatment with [name].  Vitamin D deficiency [name] has a diagnosis of vitamin D deficiency. She is currently taking vit D and denies nausea, vomiting or muscle weakness.  At risk for osteopenia and osteoporosis [name] is at higher risk of osteopenia and osteoporosis due to vitamin D deficiency.   Diabetes II [name] has a diagnosis of diabetes type II. She is taking Kombiglyze. [name] denies any hypoglycemic episodes. Last A1c was at 6.8, which is down from 8.8 She has been working on intensive lifestyle modifications including diet, exercise, and weight loss to help control her blood glucose levels.  Hair Loss [name] has hair loss and her hair comes out when she combs her hair.  ASSESSMENT AND PLAN:  Vitamin D deficiency - Plan: Vitamin D, Ergocalciferol, (DRISDOL) 1.25 MG (50000 UT) CAPS capsule  Type 2 diabetes mellitus without complication, without long-term current use of insulin (HCC)  Hair loss  At risk for osteoporosis  Class 3 severe obesity with serious comorbidity and body mass index (BMI) of 40.0 to 44.9 in adult, unspecified obesity type ([place])  PLAN:  Vitamin D Deficiency Kelyn was informed that low vitamin D levels contributes to fatigue and are associated with obesity, breast, and colon cancer. She agrees to continue to take prescription Vit D @50 ,000 IU every week #4 with no refills and will follow up for routine  testing of vitamin D, at least 2-3 times per year. She was informed of the risk of over-replacement of vitamin D and agrees to not increase her dose unless she discusses this with [name] first. Deneene agrees to follow up as directed.  At risk for osteopenia and osteoporosis [name] was given extended  (15 minutes) osteoporosis prevention counseling today. [name] is at risk for osteopenia and osteoporosis due to her vitamin D deficiency. She was encouraged to take her vitamin D and follow her higher calcium diet and increase strengthening exercise to help strengthen her bones and decrease her risk of osteopenia and osteoporosis.  Diabetes II [name] has been given extensive diabetes education by myself today including ideal fasting and post-prandial blood glucose readings, individual ideal Hgb A1c goals and hypoglycemia prevention. We discussed the importance of good blood sugar control to decrease the likelihood of diabetic complications such as nephropathy, neuropathy, limb loss, blindness, coronary artery disease, and death. We discussed the importance of intensive lifestyle modification including diet, exercise and weight loss as the first line treatment for diabetes. [name] agrees to continue her diabetes medications and will follow up at the agreed upon time.  Hair Loss She will increase her lean protein intake. She will take skin, hair and nails supplement and Omega 3. [name] will follow up with our clinic in 3 weeks.  Obesity [name] is currently in the action stage of change. As such, her goal is to continue with weight loss efforts She has agreed to follow the Category 2  plan [name] has been instructed to work up to a goal of 150 minutes of combined cardio and strengthening exercise per week for weight loss and overall health benefits. We discussed the following Behavioral Modification Strategies today: increase H2O intake, keeping healthy foods in the home, increasing lean protein intake,  decreasing simple carbohydrates, increasing vegetables, work on meal planning and easy cooking plans and ways to avoid night time snacking  [name] has agreed to follow up with our clinic in 3 weeks. She was informed of the importance of frequent follow up visits to maximize her success with intensive lifestyle modifications for her multiple health conditions.  ALLERGIES: Allergies  Allergen Reactions  . Penicillins Swelling    Has patient had a PCN reaction causing immediate rash, facial/tongue/throat swelling, SOB or lightheadedness with hypotension: YES Has patient had a PCN reaction causing severe rash involving mucus membranes or skin necrosis: NO Has patient had a PCN reaction that required hospitalization NO Has patient had a PCN reaction occurring within the last 10 years: NO If all of the above answers are "NO", then may proceed with Cephalosporin use.  [name] Hydrocodone-Acetaminophen Other (See Comments)    confusion  . Latex Rash  . Sulfa Antibiotics Diarrhea, Itching and Rash    MEDICATIONS: Current Outpatient Medications on File Prior to Visit  Medication Sig Dispense Refill  . amLODipine (NORVASC) 5 MG tablet Take 5 mg by mouth daily.  6  . aspirin EC 81 MG tablet Take 81 mg by mouth daily.    [name] atorvastatin (LIPITOR) 40 MG tablet Take 40 mg by mouth at bedtime.     . benzonatate (TESSALON PERLES) 100 MG capsule Take 1 capsule (100 mg total) by mouth 3 (three) times daily as needed for cough (cough). 20 capsule 0  . carvedilol (COREG CR) 10 MG 24 hr capsule Take 10 mg by mouth daily.    . cyclobenzaprine (FLEXERIL) 5 MG tablet Take 1 tablet (5 mg total) by mouth at bedtime. 15 tablet 0  . diclofenac sodium (VOLTAREN) 1 % GEL Apply 4 g topically 4 (four) times daily. 100 g 0  . furosemide (LASIX) 40 MG tablet Take 40 mg by mouth daily.    [name] gabapentin (NEURONTIN) 300 MG capsule Take 2 capsules (600 mg total) by mouth 3 (three) times daily. 180 capsule 2  .  HYDROcodone-acetaminophen (NORCO) 5-325 MG tablet Take 1 tablet by mouth 2 (two) times daily as needed. 14 tablet 0  . HYDROcodone-acetaminophen (NORCO/VICODIN) 5-325 MG tablet Take 1 tablet by mouth every 4 (four) hours as needed. 15 tablet 0  . isosorbide dinitrate (ISORDIL) 20 MG tablet Take 20 mg by mouth 2 (two) times daily.     [name] KLOR-CON M10 10 MEQ tablet Take 10 mEq by mouth 2 (two) times daily.     [name] KOMBIGLYZE XR 5-500 MG TB24 Take 1 tablet by mouth every morning.     . lidocaine (LIDODERM) 5 % Place 1 patch onto the skin daily. Remove & Discard patch within 12 hours or as directed by MD 15 patch 0  . lidocaine (LIDODERM) 5 % Place 1 patch onto the skin daily. Remove & Discard patch within 12 hours or as directed by MD 30 patch 0  . lisinopril (PRINIVIL,ZESTRIL) 20 MG tablet Take 20 mg by mouth daily.     [name] loperamide (IMODIUM A-D) 2 MG tablet Take 2 mg by mouth 4 (four) times daily as needed for diarrhea or loose stools.    . meloxicam (MOBIC)  15 MG tablet TAKE 1 TABLET BY MOUTH EVERY DAY X2 WEEKS 30 tablet 0  . MYRBETRIQ 25 MG TB24 tablet Take 25 mg by mouth daily.  5  . omeprazole (PRILOSEC) 20 MG capsule Take 20 mg by mouth 2 (two) times daily.    [name] oxyCODONE-acetaminophen (PERCOCET) 5-325 MG tablet Take 1 tablet by mouth every 6 (six) hours as needed. 10 tablet 0  . sertraline (ZOLOFT) 100 MG tablet Take 100 mg by mouth daily.      No current facility-administered medications on file prior to visit.     PAST MEDICAL HISTORY: Past Medical History:  Diagnosis Date  . Anginal pain ([place])    a. NL cath in 2008;  b. Myoview 03/2011: dec uptake along mid anterior wall on stress imaging -> ? attenuation vs. ischemia, EF 65%;  c. Echo 04/2011: EF 55-60%, no RWMA, Gr 2 dd  . Anxiety   . Arthritis   . Asthma   . Back pain   . Bone cancer ([place])   . Cancer ([place])   . Chest pain   . CHF (congestive heart failure) ([place])   . CHF (congestive heart failure) ([place])   . Depression   .  Diabetes mellitus   . Drug use   . Dyspnea    with exertion  . Dyspnea   . Frequent urination   . GERD (gastroesophageal reflux disease)   . Glaucoma   . Headache   . HLD (hyperlipidemia)   . Hypertension   . IBS (irritable bowel syndrome)   . Joint pain   . Lactose intolerance   . Leg edema   . 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 Obesity   . Palpitations   . Pneumonia 05/2016   double  . Pulmonary edema   . Pulmonary embolism ([place])    a. 2008 -> coumadin x 6 mos.  . Rheumatoid arthritis ([place])   . Sleep apnea    on CPAP 02/2018  . TIA (transient ischemic attack)   . Urinary urgency     PAST SURGICAL HISTORY: Past Surgical History:  Procedure Laterality Date  . ABDOMINAL HYSTERECTOMY  2005  . CARDIAC CATHETERIZATION     Normal  . CARDIAC CATHETERIZATION N/A 09/30/2014   Procedure: Left Heart Cath and Coronary Angiography;  Surgeon: Sherren Mocha, MD;  Location: [facility];  Service: Cardiovascular;  Laterality: N/A;  . LAPAROSCOPIC APPENDECTOMY N/A 06/03/2012   Procedure: APPENDECTOMY LAPAROSCOPIC;  Surgeon: Stark Klein, MD;  Location: [facility];  Service: General;  Laterality: N/A;  . Left knee surgery  2008  . LEG SURGERY    . TONSILLECTOMY    . TOTAL KNEE ARTHROPLASTY Left 08/22/2016   Procedure: TOTAL KNEE ARTHROPLASTY;  Surgeon: Meredith Pel, MD;  Location: [facility];  Service: Orthopedics;  Laterality: Left;  . TUBAL LIGATION  1989    SOCIAL HISTORY: Social History   Tobacco Use  . Smoking status: Former Smoker    Packs/day: 0.50    Years: 15.00    Pack years: 7.50    Types: Cigarettes    Last attempt to quit: 04/24/1985    Years since quitting: 33.0  . Smokeless tobacco: Never Used  Substance Use Topics  . Alcohol use: No    Alcohol/week: 0.0 standard drinks  . Drug use: Yes    Types: Marijuana    FAMILY HISTORY: Family History  Problem Relation Age of Onset  . Emphysema Mother   .  Arthritis Mother   . Heart failure  Mother        alive @ 9  . Stroke Mother   . Diabetes Mother   . Hypertension Mother   . Hyperlipidemia Mother   . Depression Mother   . Anxiety disorder Mother   . Asthma Brother   . Heart disease Father        died @ 70's.  . Stroke Father   . Diabetes Father   . Hyperlipidemia Father   . Hypertension Father   . Bipolar disorder Father   . Sleep apnea Father   . Alcoholism Father   . Drug abuse Father   . Diabetes Sister   . Heart disease Paternal Grandfather   . Colon cancer Maternal Grandfather   . Breast cancer Maternal Aunt     ROS: Review of Systems  Constitutional: Positive for weight loss.  Gastrointestinal: Negative for nausea and vomiting.  Musculoskeletal:       Negative for muscle weakness  Skin:       Positive for hair loss  Endo/Heme/Allergies:       Negative for hypoglycemia    PHYSICAL EXAM: Blood pressure 122/74, pulse 77, temperature (!) 97.2 F (36.2 C), temperature source Oral, height 4\' 10"  (1.473 m), weight 204 lb (92.5 kg), SpO2 98 %. Body mass index is 42.64 kg/m. Physical Exam Vitals signs reviewed.  Constitutional:      Appearance: Normal appearance. She is well-developed. She is obese.  Cardiovascular:     Rate and Rhythm: Normal rate.  Pulmonary:     Effort: Pulmonary effort is normal.  Musculoskeletal: Normal range of motion.  Skin:    General: Skin is warm and dry.  Neurological:     Mental Status: She is alert and oriented to person, place, and time.  Psychiatric:        Mood and Affect: Mood normal.        Behavior: Behavior normal.     RECENT LABS AND TESTS: BMET    Component Value Date/Time   NA 139 04/27/2018 2133   NA 144 03/04/2018 1152   K 3.4 (L) 04/27/2018 2133   CL 105 04/27/2018 2133   CO2 25 04/27/2018 2133   GLUCOSE 95 04/27/2018 2133   BUN 13 04/27/2018 2133   BUN 17 03/04/2018 1152   CREATININE 0.84 04/27/2018 2133   CALCIUM 9.2 04/27/2018 2133   GFRNONAA >60 04/27/2018 2133   GFRAA >60  04/27/2018 2133   Lab Results  Component Value Date   HGBA1C 6.8 (H) 03/04/2018   HGBA1C 8.8 (H) 11/20/2017   HGBA1C 7.4 (H) 08/11/2016   HGBA1C 6.6 (H) 05/21/2016   HGBA1C 6.2 (H) 10/28/2012   Lab Results  Component Value Date   INSULIN 20.2 03/04/2018   INSULIN 53.9 (H) 11/20/2017   CBC    Component Value Date/Time   WBC 6.8 04/27/2018 2133   RBC 4.90 04/27/2018 2133   HGB 13.1 04/27/2018 2133   HGB 12.8 11/20/2017 0925   HCT 43.1 04/27/2018 2133   HCT 40.6 11/20/2017 0925   PLT 253 04/27/2018 2133   MCV 88.0 04/27/2018 2133   MCV 85 11/20/2017 0925   MCH 26.7 04/27/2018 2133   MCHC 30.4 04/27/2018 2133   RDW 14.4 04/27/2018 2133   RDW 13.9 11/20/2017 0925   LYMPHSABS 2.1 11/20/2017 0925   MONOABS 0.2 05/21/2016 0800   EOSABS 0.4 11/20/2017 0925   BASOSABS 0.0 11/20/2017 0925   Iron/TIBC/Ferritin/ %Sat No results found for: IRON,  TIBC, FERRITIN, IRONPCTSAT Lipid Panel     Component Value Date/Time   CHOL 162 03/04/2018 1152   TRIG 117 03/04/2018 1152   HDL 50 03/04/2018 1152   CHOLHDL 4.2 04/23/2011 0830   CHOLHDL 4.3 04/23/2011 0830   VLDL 29 04/23/2011 0830   VLDL 29 04/23/2011 0830   LDLCALC 89 03/04/2018 1152   Hepatic Function Panel     Component Value Date/Time   PROT 6.4 03/04/2018 1152   ALBUMIN 4.1 03/04/2018 1152   AST 19 03/04/2018 1152   ALT 40 (H) 03/04/2018 1152   ALKPHOS 61 03/04/2018 1152   BILITOT 0.4 03/04/2018 1152   BILIDIR 0.1 05/22/2016 0913   IBILI 0.6 05/22/2016 0913      Component Value Date/Time   TSH 0.638 11/20/2017 0925   TSH 0.112 (L) 05/24/2016 0629   TSH 0.345 (L) 06/18/2014 0930   TSH 0.167 (L) 10/28/2012 0144     Ref. Range 03/04/2018 11:52  Vitamin D, 25-Hydroxy Latest Ref Range: 30.0 - 100.0 ng/mL 34.5     OBESITY BEHAVIORAL INTERVENTION VISIT  Today's visit was # 10   Starting weight: 225 lbs Starting date: 11/20/2017 Today's weight : 204 lbs Today's date: 05/06/2018 Total lbs lost to date:  21   ASK: We discussed the diagnosis of obesity with [name] today and [name] agreed to give [name] permission to discuss obesity behavioral modification therapy today.  ASSESS: [name] has the diagnosis of obesity and her BMI today is 42.65 [name] is in the action stage of change   ADVISE: Dmiya was educated on the multiple health risks of obesity as well as the benefit of weight loss to improve her health. She was advised of the need for long term treatment and the importance of lifestyle modifications to improve her current health and to decrease her risk of future health problems.  AGREE: Multiple dietary modification options and treatment options were discussed and  [name] agreed to follow the recommendations documented in the above note.  ARRANGE: [name] was educated on the importance of frequent visits to treat obesity as outlined per CMS and USPSTF guidelines and agreed to schedule her next follow up appointment today.  Corey Skains, am acting as Location manager for [employer]. Owens Shark, DO  I have reviewed the above documentation for accuracy and completeness, and I agree with the above. -Jearld Lesch, DO

## 2018-05-08 ENCOUNTER — Ambulatory Visit (INDEPENDENT_AMBULATORY_CARE_PROVIDER_SITE_OTHER): Payer: Medicare Other | Admitting: Orthopedic Surgery

## 2018-05-08 ENCOUNTER — Ambulatory Visit (INDEPENDENT_AMBULATORY_CARE_PROVIDER_SITE_OTHER): Payer: Self-pay

## 2018-05-08 ENCOUNTER — Encounter (INDEPENDENT_AMBULATORY_CARE_PROVIDER_SITE_OTHER): Payer: Self-pay | Admitting: Orthopedic Surgery

## 2018-05-08 ENCOUNTER — Encounter (INDEPENDENT_AMBULATORY_CARE_PROVIDER_SITE_OTHER): Payer: Self-pay | Admitting: Bariatrics

## 2018-05-08 DIAGNOSIS — M25551 Pain in right hip: Secondary | ICD-10-CM

## 2018-05-08 DIAGNOSIS — M1612 Unilateral primary osteoarthritis, left hip: Secondary | ICD-10-CM | POA: Diagnosis not present

## 2018-05-10 ENCOUNTER — Encounter (INDEPENDENT_AMBULATORY_CARE_PROVIDER_SITE_OTHER): Payer: Self-pay | Admitting: Orthopedic Surgery

## 2018-05-10 NOTE — Progress Notes (Signed)
Office Visit Note   Patient: [name]           Date of Birth: Sep 11, 1960           MRN: [id number] Visit Date: 05/08/2018 Requested by: Velna Hatchet, MD [address] [place], [place] [address] PCP: Tsosie Billing, MD  Subjective: Chief Complaint  Patient presents with  . Left Hip - Pain    HPI: [name] is a patient with left hip pain.  She reports sharp pain radiating down the left leg.  She does physical therapy twice a week starting in December.  Symptoms are worse when she is walking and standing.  Taking Mobic and hydrocodone.  Last injection gave her great relief but it lasted less than a week.  She is using a cane.  She saw dentist in November and December with no immediate work pending.  She states she is really at her wits end with this left hip pain.  Her quality of life is significantly and adversely affected.              ROS: All systems reviewed are negative as they relate to the chief complaint within the history of present illness.  Patient denies  fevers or chills.   Assessment & Plan: Visit Diagnoses:  1. Pain in right hip   2. Arthritis of left hip     Plan: Impression is left hip arthritis.  Plan left total hip replacement.  I did place the patient on the table today and I think her overlying pannus can be retracted and most of her weight is carried in that central core as opposed in her legs.  The risk and benefits of hip replacement are discussed including but limited to infection nerve vessel damage incomplete pain relief as well as potential need for revision in her lifetime as well as dislocation and leg length inequality.  All questions answered.  No personal or family history of DVT or pulmonary embolism.  Follow-Up Instructions: No follow-ups on file.   Orders:  Orders Placed This Encounter  Procedures  . XR Pelvis 1-2 Views   No orders of the defined types were placed in this encounter.     Procedures: No procedures  performed   Clinical Data: No additional findings.  Objective: Vital Signs: There were no vitals taken for this visit.  Physical Exam:   Constitutional: Patient appears well-developed HEENT:  Head: Normocephalic Eyes:EOM are normal Neck: Normal range of motion Cardiovascular: Normal rate Pulmonary/chest: Effort normal Neurologic: Patient is alert Skin: Skin is warm Psychiatric: Patient has normal mood and affect    Ortho Exam: Ortho exam demonstrates full active and passive range of motion of the right hip.  Left hip demonstrates pain with internal and external rotation.  Pedal pulses palpable.  Leg lengths approximately equal although she may be slightly shorter on the left compared to the right based on radiographs.  No skin issues particularly in the groin fold on the left.  Specialty Comments:  No specialty comments available.  Imaging: No results found.   PMFS History: Patient Active Problem List   Diagnosis Date Noted  . Other fatigue 11/20/2017  . Shortness of breath on exertion 11/20/2017  . Type 2 diabetes mellitus without complication, without long-term current use of insulin ([place]) 11/20/2017  . Vitamin D deficiency 11/20/2017  . Depression 11/20/2017  . Other hyperlipidemia 11/20/2017  . 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 Presence of left artificial knee joint 09/20/2016  . Arthritis  of knee 08/22/2016  . Cervical radiculopathy 07/26/2016  . Cervical disc disorder with radiculopathy 07/26/2016  . Right arm pain 06/29/2016  . Cervicalgia 06/29/2016  . Primary osteoarthritis of left knee 06/29/2016  . Hypersomnia with sleep apnea 11/18/2015  . Lethargy 11/18/2015  . Chest pain 09/30/2014  . Abnormal cardiac function test 09/29/2014  . Chest pain with moderate risk for cardiac etiology 09/28/2014  . HTN (hypertension) 09/28/2014  . Hypokalemia   . Paresthesia 06/18/2014  . Numbness and tingling of left arm and leg   . Unstable angina ([place])  05/24/2014  . OSA on CPAP 05/24/2014  . Diabetes mellitus type 2 in obese ([employer]) 10/29/2012  . S/P laparoscopic appendectomy 06/04/2012  . Diastolic CHF ([place]) 38/01/1750  . Mediastinal mass 01/09/2012  . SOB (shortness of breath) 06/20/2011  . Mediastinal abnormality 06/20/2011  . Dyslipidemia 12/23/2009  . GLAUCOMA 12/23/2009  . ARTHRITIS 12/23/2009  . 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 Morbid obesity-BMI 45 09/13/2006  . ANXIETY STATE NOS 09/13/2006  . DISORDER, DEPRESSIVE NEC 09/13/2006  . CARPAL TUNNEL SYNDROME, MILD 09/13/2006  . Unspecified essential hypertension 09/13/2006  . IBS 09/13/2006  . DEGENERATION, LUMBAR/LUMBOSACRAL DISC 09/13/2006  . SYMPTOM, SWELLING/MASS/LUMP IN CHEST 09/13/2006  . PULMONARY EMBOLISM, HX OF 09/13/2006   Past Medical History:  Diagnosis Date  . Anginal pain ([place])    a. NL cath in 2008;  b. Myoview 03/2011: dec uptake along mid anterior wall on stress imaging -> ? attenuation vs. ischemia, EF 65%;  c. Echo 04/2011: EF 55-60%, no RWMA, Gr 2 dd  . Anxiety   . Arthritis   . Asthma   . Back pain   . Bone cancer ([place])   . Cancer ([place])   . Chest pain   . CHF (congestive heart failure) ([place])   . CHF (congestive heart failure) ([place])   . Depression   . Diabetes mellitus   . Drug use   . Dyspnea    with exertion  . Dyspnea   . Frequent urination   . GERD (gastroesophageal reflux disease)   . Glaucoma   . Headache   . HLD (hyperlipidemia)   . Hypertension   . IBS (irritable bowel syndrome)   . Joint pain   . Lactose intolerance   . Leg edema   . 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 Obesity   . Palpitations   . Pneumonia 05/2016   double  . Pulmonary edema   . Pulmonary embolism ([place])    a. 2008 -> coumadin x 6 mos.  . Rheumatoid arthritis ([place])   . Sleep apnea    on CPAP 02/2018  . TIA (transient ischemic attack)   . Urinary urgency     Family History  Problem Relation Age of Onset  . Emphysema Mother   .  Arthritis Mother   . Heart failure Mother        alive @ 45  . Stroke Mother   . Diabetes Mother   . Hypertension Mother   . Hyperlipidemia Mother   . Depression Mother   . Anxiety disorder Mother   . Asthma Brother   . Heart disease Father        died @ 96's.  . Stroke Father   . Diabetes Father   . Hyperlipidemia Father   . Hypertension Father   . Bipolar disorder Father   . Sleep apnea Father   . Alcoholism Father   . Drug abuse  Father   . Diabetes Sister   . Heart disease Paternal Grandfather   . Colon cancer Maternal Grandfather   . Breast cancer Maternal Aunt     Past Surgical History:  Procedure Laterality Date  . ABDOMINAL HYSTERECTOMY  2005  . CARDIAC CATHETERIZATION     Normal  . CARDIAC CATHETERIZATION N/A 09/30/2014   Procedure: Left Heart Cath and Coronary Angiography;  Surgeon: Sherren Mocha, MD;  Location: [facility];  Service: Cardiovascular;  Laterality: N/A;  . LAPAROSCOPIC APPENDECTOMY N/A 06/03/2012   Procedure: APPENDECTOMY LAPAROSCOPIC;  Surgeon: Stark Klein, MD;  Location: [facility];  Service: General;  Laterality: N/A;  . Left knee surgery  2008  . LEG SURGERY    . TONSILLECTOMY    . TOTAL KNEE ARTHROPLASTY Left 08/22/2016   Procedure: TOTAL KNEE ARTHROPLASTY;  Surgeon: Meredith Pel, MD;  Location: [facility];  Service: Orthopedics;  Laterality: Left;  . TUBAL LIGATION  1989   Social History   Occupational History  . Occupation: UNEMPLOYED    Employer: DISABLED  Tobacco Use  . Smoking status: Former Smoker    Packs/day: 0.50    Years: 15.00    Pack years: 7.50    Types: Cigarettes    Last attempt to quit: 04/24/1985    Years since quitting: 33.0  . Smokeless tobacco: Never Used  Substance and Sexual Activity  . Alcohol use: No    Alcohol/week: 0.0 standard drinks  . Drug use: Yes    Types: Marijuana  . Sexual activity: Never

## 2018-05-20 ENCOUNTER — Other Ambulatory Visit (INDEPENDENT_AMBULATORY_CARE_PROVIDER_SITE_OTHER): Payer: Self-pay | Admitting: Orthopedic Surgery

## 2018-05-20 DIAGNOSIS — M1612 Unilateral primary osteoarthritis, left hip: Secondary | ICD-10-CM

## 2018-05-21 NOTE — Pre-Procedure Instructions (Addendum)
QUINTERIA CHISUM  05/21/2018      [id number] [address], [place] [address] Phone: [phone] Fax: [phone]    Your procedure is scheduled on Feb. 6  Report to [facility] Admitting at 10:15  A.M.  Call this number if you have problems the morning of surgery:  [phone]   Remember:  Do not eat or drink after midnight.      Take these medicines the morning of surgery with A SIP OF WATER :              Albuterol inhaler if needed--bring to hospital             Amlodipine (norvasc)             Carvedilol (coreg)             Gabapentin (nerurontin)            Isosorbide (isordil)            myrbetriq            Omeprazole (prilosec)            Oxycodone if needed            Sertraline (zoloft)   7 days prior to surgery STOP taking any Aspirin (unless otherwise instructed by your surgeon), Aleve, Naproxen, Ibuprofen, Motrin, Advil, Goody's, BC's, all herbal medications, fish oil, and all vitamins.               Follow your surgeon's instructions on when to stop Asprin.  If no instructions were given by your surgeon then you will need to call the office to get those instructions.                   How to Manage Your Diabetes Before and After Surgery  Why is it important to control my blood sugar before and after surgery? . Improving blood sugar levels before and after surgery helps healing and can limit problems. . A way of improving blood sugar control is eating a healthy diet by: o  Eating less sugar and carbohydrates o  Increasing activity/exercise o  Talking with your doctor about reaching your blood sugar goals . High blood sugars (greater than 180 mg/dL) can raise your risk of infections and slow your recovery, so you will need to focus on controlling your diabetes during the weeks before surgery. . Make sure that the doctor who takes care of your diabetes knows about your planned surgery  including the date and location.  How do I manage my blood sugar before surgery? . Check your blood sugar at least 4 times a day, starting 2 days before surgery, to make sure that the level is not too high or low. o Check your blood sugar the morning of your surgery when you wake up and every 2 hours until you get to the Short Stay unit. . If your blood sugar is less than 70 mg/dL, you will need to treat for low blood sugar: o Do not take insulin. o Treat a low blood sugar (less than 70 mg/dL) with  cup of clear juice (cranberry or apple), 4 glucose tablets, OR glucose gel. Recheck blood sugar in 15 minutes after treatment (to make sure it is greater than 70 mg/dL). If your blood sugar is not greater than 70 mg/dL on recheck, call [phone] o  for further instructions. . Report your blood  sugar to the short stay nurse when you get to Short Stay.  . If you are admitted to the hospital after surgery: o Your blood sugar will be checked by the staff and you will probably be given insulin after surgery (instead of oral diabetes medicines) to make sure you have good blood sugar levels. o The goal for blood sugar control after surgery is 80-180 mg/dL.       WHAT DO I DO ABOUT MY DIABETES MEDICATION?   [name] Do not take oral diabetes medicines (pills) the morning of surgery.    Do not wear jewelry, make-up or nail polish.  Do not wear lotions, powders, or perfumes, or deodorant.  Do not shave 48 hours prior to surgery.  Men may shave face and neck.  Do not bring valuables to the hospital.  [facility] is not responsible for any belongings or valuables.  Contacts, dentures or bridgework may not be worn into surgery.  Leave your suitcase in the car.  After surgery it may be brought to your room.  For patients admitted to the hospital, discharge time will be determined by your treatment team.  Patients discharged the day of surgery will not be allowed to drive home.    Special  instructions:  [place]- Preparing For Surgery  Before surgery, you can play an important role. Because skin is not sterile, your skin needs to be as free of germs as possible. You can reduce the number of germs on your skin by washing with CHG (chlorahexidine gluconate) Soap before surgery.  CHG is an antiseptic cleaner which kills germs and bonds with the skin to continue killing germs even after washing.    Oral Hygiene is also important to reduce your risk of infection.  Remember - BRUSH YOUR TEETH THE MORNING OF SURGERY WITH YOUR REGULAR TOOTHPASTE  Please do not use if you have an allergy to CHG or antibacterial soaps. If your skin becomes reddened/irritated stop using the CHG.  Do not shave (including legs and underarms) for at least 48 hours prior to first CHG shower. It is OK to shave your face.  Please follow these instructions carefully.   1. Shower the NIGHT BEFORE SURGERY and the MORNING OF SURGERY with CHG.   2. If you chose to wash your hair, wash your hair first as usual with your normal shampoo.  3. After you shampoo, rinse your hair and body thoroughly to remove the shampoo.  4. Use CHG as you would any other liquid soap. You can apply CHG directly to the skin and wash gently with a scrungie or a clean washcloth.   5. Apply the CHG Soap to your body ONLY FROM THE NECK DOWN.  Do not use on open wounds or open sores. Avoid contact with your eyes, ears, mouth and genitals (private parts). Wash Face and genitals (private parts)  with your normal soap.  6. Wash thoroughly, paying special attention to the area where your surgery will be performed.  7. Thoroughly rinse your body with warm water from the neck down.  8. DO NOT shower/wash with your normal soap after using and rinsing off the CHG Soap.  9. Pat yourself dry with a CLEAN TOWEL.  10. Wear CLEAN PAJAMAS to bed the night before surgery, wear comfortable clothes the morning of surgery  11. Place CLEAN SHEETS on  your bed the night of your first shower and DO NOT SLEEP WITH PETS.    Day of Surgery:  Do not apply  any deodorants/lotions.  Please wear clean clothes to the hospital/surgery center.   Remember to brush your teeth WITH YOUR REGULAR TOOTHPASTE.    Please read over the following fact sheets that you were given. Coughing and Deep Breathing, MRSA Information and Surgical Site Infection Prevention

## 2018-05-22 ENCOUNTER — Encounter (HOSPITAL_COMMUNITY)
Admission: RE | Admit: 2018-05-22 | Discharge: 2018-05-22 | Disposition: A | Discharge status: Home / Self Care | Payer: Medicare Other | Source: Ambulatory Visit | Attending: Orthopedic Surgery | Admitting: Orthopedic Surgery

## 2018-05-22 ENCOUNTER — Encounter (HOSPITAL_COMMUNITY): Payer: Self-pay

## 2018-05-22 ENCOUNTER — Telehealth: Payer: Self-pay | Admitting: *Deleted

## 2018-05-22 DIAGNOSIS — Z01812 Encounter for preprocedural laboratory examination: Secondary | ICD-10-CM | POA: Insufficient documentation

## 2018-05-22 LAB — BASIC METABOLIC PANEL
Anion gap: 9 (ref 5–15)
BUN: 16 mg/dL (ref 6–20)
CO2: 27 mmol/L (ref 22–32)
Calcium: 9.4 mg/dL (ref 8.9–10.3)
Chloride: 106 mmol/L (ref 98–111)
Creatinine, Ser: 0.83 mg/dL (ref 0.44–1.00)
GFR calc Af Amer: >60 mL/min (ref >60)
GFR calc non Af Amer: >60 mL/min (ref >60)
Glucose, Bld: 100 mg/dL — ABNORMAL HIGH (ref 70–99)
Potassium: 3.7 mmol/L (ref 3.5–5.1)
Sodium: 142 mmol/L (ref 135–145)

## 2018-05-22 LAB — URINALYSIS, ROUTINE W REFLEX MICROSCOPIC
Bilirubin Urine: NEGATIVE
Glucose, UA: NEGATIVE mg/dL
Hgb urine dipstick: NEGATIVE
Ketones, ur: NEGATIVE mg/dL
Leukocytes, UA: NEGATIVE
Nitrite: NEGATIVE
Protein, ur: NEGATIVE mg/dL
Specific Gravity, Urine: 1.012 (ref 1.005–1.030)
pH: 6 (ref 5.0–8.0)

## 2018-05-22 LAB — GLUCOSE, CAPILLARY: Glucose-Capillary: 93 mg/dL (ref 70–99)

## 2018-05-22 LAB — CBC
HCT: 41.2 % (ref 36.0–46.0)
Hemoglobin: 13.1 g/dL (ref 12.0–15.0)
MCH: 27.8 pg (ref 26.0–34.0)
MCHC: 31.8 g/dL (ref 30.0–36.0)
MCV: 87.5 fL (ref 80.0–100.0)
Platelets: 244 10*3/uL (ref 150–400)
RBC: 4.71 MIL/uL (ref 3.87–5.11)
RDW: 13.9 % (ref 11.5–15.5)
WBC: 5.9 10*3/uL (ref 4.0–10.5)
nRBC: 0 % (ref 0.0–0.2)

## 2018-05-22 LAB — HEMOGLOBIN A1C
Hgb A1c MFr Bld: 6.6 % — ABNORMAL HIGH (ref 4.8–5.6)
Mean Plasma Glucose: 142.72 mg/dL

## 2018-05-22 LAB — SURGICAL PCR SCREEN
MRSA, PCR: NEGATIVE
Staphylococcus aureus: NEGATIVE

## 2018-05-22 NOTE — Telephone Encounter (Signed)
   [facility] Pre-operative Risk Assessment    Request for surgical clearance:  1. What type of surgery is being performed? LEFT THA   2. When is this surgery scheduled? TBD   3. What type of clearance is required (medical clearance vs. Pharmacy clearance to hold med vs. Both)? MEDICAL  4. Are there any medications that need to be held prior to surgery and how long?ASA   5. Practice name and name of physician performing surgery? [facility]; DR. Nicki Reaper DEAN   6. What is your office phone number [phone]    7.   What is your office fax number [phone]  8.   Anesthesia type (None, local, MAC, general) ? GENERAL ?    [name] 05/22/2018, 3:55 PM  _________________________________________________________________   (provider comments below)

## 2018-05-22 NOTE — Progress Notes (Addendum)
PCP - Cristy Friedlander @ [facility] in [place] Neurology: [facility]  Chest x-ray -  04-27-18 EKG - 04-28-18 Stress Test - 7/14 ECHO - 1/18 Cardiac Cath - 6/16  Sleep Study - 1/16 CPAP - yes  Fasting Blood Sugar - 80-120 Checks Blood Sugar _____ times a day  Blood Thinner Instructions: Aspirin Instructions: will call Dr. Randel Pigg office,   Anesthesia review: ekg/cardiac hx.  Patient denies shortness of breath, fever, cough and chest pain at PAT appointment   Patient verbalized understanding of instructions that were given to them at the PAT appointment. Patient was also instructed that they will need to review over the PAT instructions again at home before surgery.

## 2018-05-23 LAB — URINE CULTURE: Culture: NO GROWTH

## 2018-05-23 NOTE — Telephone Encounter (Signed)
Follow Up:     [name] from Dr Randel Pigg office called and said pt iis scheduled for surgery on 05-30-18. If  Pt needs to be seen before surgery, surgery can be rescheduled.

## 2018-05-23 NOTE — Progress Notes (Addendum)
Anesthesia Chart Review:  Case:  [id number] Date/Time:  05/30/18 1159   Procedure:  LEFT TOTAL HIP ARTHROPLASTY ANTERIOR APPROACH (Left )   Anesthesia type:  Spinal   Pre-op diagnosis:  left hip osteoarthritis   Location:  [facility] OR ROOM 10 / [facility]   Surgeon:  Meredith Pel, MD      DISCUSSION: Patient is a 58 year old female scheduled for the above procedure.  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 Denied current substance abuse (history of marijuana). Bone cancer is listed in her history in 2014, but otherwise no additional details. BMI 42, consistent with morbid obesity (but down from 46).  - She has had mild elevations in troponin during two hospital evaluations in 2018. First in 05/2106 for acute respiratory failure with hypoxia due to CAP, with flat serial troponins of 0.05. Secondly on 09/30/16 she was seen in the ED post left TKA with SOB and LLE pain/swelling. LLE U/S for negative for DVT. CTA negative for PE. Troponins 0.13-0.12 and was referred for out-patient cardiology evaluation. She was seen by Angelena Form, PA-C on 10/12/16 (in the absence of acute symptoms) and troponin still elevated at 0.11 with normal CK-MD of 1.9 and BNP 144. She discussed further with Ena Dawley, MD and no further work-up planned at that time given patient's normal coronaries in 2016 and negative CTA for PE earlier that month.   Dr. Randel Pigg office has reached out to [facility] regarding preoperative cardiology evaluation. Will leave chart for follow-up cardiology input.   ADDENDUM 06/07/18 3:11 PM:  Patient was seen by cardiologist Dr. Johnsie Cancel on 06/05/18. He wrote, "Clear to have total left hip replacement Meticulous DVT prophylaxis  given obesity and history of PE." As needed cardiology follow-up recommended. If no acute changes then I would anticipate that she can proceed as planned. Patient's labs will be > 42 days old, and she is a 7:15 AM start for 06/11/18, so Debbie at Dr. Randel Pigg office to contact our scheduler if patient able to do a lab only visit 06/10/18 otherwise repeat labs per surgeon's and anesthesiologist's discretion on the morning of surgery. (UPDATE 06/20/18 4:02 PM: Repeat CBC and BMET on 06/10/18 WNL.)   VS: BP (!) 163/77   Pulse 71   Temp 36.8 C   Resp 20   Ht 4\' 11"  (1.499 m)   Wt 94.7 kg   SpO2 98%   BMI 42.17 kg/m    PROVIDERS: Tsosie Billing, MD is PCP [facility]) - She is seen by Jearld Lesch, DO at the [facility]. - OSA is managed through [facility].  [name], MD is cardiologist. Last visit 10/12/16 with Angelena Form, PA-C.    LABS: Labs reviewed: Acceptable for surgery. Updated BMET and CBC on 06/10/18 WNL. (all labs ordered are listed, but only abnormal results are displayed)  Labs Reviewed  BASIC METABOLIC PANEL - Abnormal; Notable for the following components:      Result Value   Glucose, Bld 100 (*)    All other components within normal limits  URINALYSIS, ROUTINE W REFLEX MICROSCOPIC - Abnormal; Notable for the following components:   Color, Urine STRAW (*)    All other components within normal limits  HEMOGLOBIN A1C -  Abnormal; Notable for the following components:   Hgb A1c MFr Bld 6.6 (*)    All other components within normal limits  SURGICAL PCR SCREEN  URINE CULTURE  GLUCOSE, CAPILLARY  CBC    IMAGES: CXR 04/27/18: IMPRESSION: Borderline enlargement of cardiac silhouette with chronic pulmonary vascular congestion. Mild chronic bronchitic changes without acute infiltrate.   EKG: 04/27/18: NSR, rightward axis. Incomplete left BBB. ST/T wave abnormality, consider inferior ischemia. Overall,  EKG appears stable when compared to previously tracing dating back to at least 03/27/17.    CV: Echo 05/22/16: Study Conclusions: - Left ventricle: The cavity size was normal. Wall thickness wasnormal. Systolic function was normal. The estimated ejectionfraction was in the range of 55% to 60%. Although no diagnosticregional wall motion abnormality was identified, this possibilitycannot be completely excluded on the basis of this study.Features are consistent with a pseudonormal left ventricularfilling pattern, with concomitant abnormal relaxation andincreased filling pressure (grade 2 diastolic dysfunction). - Aortic valve: There was no stenosis. - Mitral valve: There was no significant regurgitation. - Right ventricle: The cavity size was normal. Systolic functionwas normal. - Pulmonary arteries: No complete TR doppler jet so unable toestimate PA systolic pressure. - Systemic veins: IVC not visualized. Impressions:  - Normal LV size with EF 55-60%. Moderate diastolic dysfunction. Normal RV size and systolic function. No significant valvularabnormalities.   Cardiac cath 09/30/14:   ANGIOGRAPHICALLY NORMAL CORONARY ARTERIES  NORMAL LV FUNCTION  ELEVATED LVEDP    Past Medical History:  Diagnosis Date  . Anginal pain ([place])    a. NL cath in 2008;  b. Myoview 03/2011: dec uptake along mid anterior wall on stress imaging -> ? attenuation vs. ischemia, EF 65%;  c. Echo 04/2011: EF 55-60%, no RWMA, Gr 2 dd  . Anxiety   . Arthritis   . Asthma   . Back pain   . Bone cancer ([place])   . Cancer ([place])   . Chest pain   . CHF (congestive heart failure) ([place])   . CHF (congestive heart failure) ([place])   . Depression   . Diabetes mellitus   . Drug use   . Dyspnea    with exertion  . Dyspnea   . Frequent urination   . GERD (gastroesophageal reflux disease)   . Glaucoma   . HLD (hyperlipidemia)   . Hypertension   . IBS (irritable bowel syndrome)   . Joint pain   . Lactose  intolerance   . Leg edema   . 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 Obesity   . Palpitations   . Pneumonia 05/2016   double  . Pulmonary edema   . Pulmonary embolism ([place])    a. 2008 -> coumadin x 6 mos.  . Rheumatoid arthritis ([place])   . Sleep apnea    on CPAP 02/2018  . TIA (transient ischemic attack)   . Urinary urgency     Past Surgical History:  Procedure Laterality Date  . ABDOMINAL HYSTERECTOMY  2005  . APPENDECTOMY    . CARDIAC CATHETERIZATION     Normal  . CARDIAC CATHETERIZATION N/A 09/30/2014   Procedure: Left Heart Cath and Coronary Angiography;  Surgeon: Sherren Mocha, MD;  Location: [facility];  Service: Cardiovascular;  Laterality: N/A;  . LAPAROSCOPIC APPENDECTOMY N/A 06/03/2012   Procedure: APPENDECTOMY LAPAROSCOPIC;  Surgeon: Stark Klein, MD;  Location: [facility];  Service: General;  Laterality: N/A;  . Left knee surgery  2008  . LEG SURGERY    .  TONSILLECTOMY    . TOTAL KNEE ARTHROPLASTY Left 08/22/2016   Procedure: TOTAL KNEE ARTHROPLASTY;  Surgeon: Meredith Pel, MD;  Location: [facility];  Service: Orthopedics;  Laterality: Left;  . TUBAL LIGATION  1989    MEDICATIONS: . albuterol (PROVENTIL HFA;VENTOLIN HFA) 108 (90 Base) MCG/ACT inhaler  . amLODipine (NORVASC) 5 MG tablet  . aspirin EC 81 MG tablet  . atorvastatin (LIPITOR) 40 MG tablet  . benzonatate (TESSALON PERLES) 100 MG capsule  . carvedilol (COREG CR) 10 MG 24 hr capsule  . cyclobenzaprine (FLEXERIL) 5 MG tablet  . diclofenac sodium (VOLTAREN) 1 % GEL  . furosemide (LASIX) 40 MG tablet  . gabapentin (NEURONTIN) 300 MG capsule  . HYDROcodone-acetaminophen (NORCO) 5-325 MG tablet  . isosorbide dinitrate (ISORDIL) 20 MG tablet  . KLOR-CON M10 10 MEQ tablet  . KOMBIGLYZE XR 5-500 MG TB24  . lidocaine (LIDODERM) 5 %  . lisinopril (PRINIVIL,ZESTRIL) 20 MG tablet  . loperamide (IMODIUM A-D) 2 MG tablet  . meloxicam (MOBIC) 15 MG tablet  . MYRBETRIQ 25 MG TB24 tablet  .  omeprazole (PRILOSEC) 20 MG capsule  . oxyCODONE-acetaminophen (PERCOCET) 5-325 MG tablet  . sertraline (ZOLOFT) 100 MG tablet  . Vitamin D, Ergocalciferol, (DRISDOL) 1.25 MG (50000 UT) CAPS capsule   No current facility-administered medications for this encounter.     Myra Gianotti, PA-C Surgical Short Stay/Anesthesiology [facility] Phone [phone] [facility] Phone [phone] 05/23/2018 3:17 PM

## 2018-05-27 ENCOUNTER — Ambulatory Visit (INDEPENDENT_AMBULATORY_CARE_PROVIDER_SITE_OTHER): Payer: Medicare Other | Admitting: Bariatrics

## 2018-05-27 ENCOUNTER — Encounter (INDEPENDENT_AMBULATORY_CARE_PROVIDER_SITE_OTHER): Payer: Self-pay | Admitting: Bariatrics

## 2018-05-27 ENCOUNTER — Other Ambulatory Visit (INDEPENDENT_AMBULATORY_CARE_PROVIDER_SITE_OTHER): Payer: Self-pay | Admitting: Orthopedic Surgery

## 2018-05-27 VITALS — BP 150/78 | HR 59 | Temp 97.9°F | Ht <= 58 in | Wt 202.0 lb

## 2018-05-27 DIAGNOSIS — Z6841 Body Mass Index (BMI) 40.0 and over, adult: Secondary | ICD-10-CM

## 2018-05-27 DIAGNOSIS — E119 Type 2 diabetes mellitus without complications: Secondary | ICD-10-CM | POA: Diagnosis not present

## 2018-05-27 DIAGNOSIS — I10 Essential (primary) hypertension: Secondary | ICD-10-CM

## 2018-05-27 DIAGNOSIS — E559 Vitamin D deficiency, unspecified: Secondary | ICD-10-CM

## 2018-05-27 DIAGNOSIS — E876 Hypokalemia: Secondary | ICD-10-CM | POA: Diagnosis not present

## 2018-05-27 MED ORDER — VITAMIN D (ERGOCALCIFEROL) 1.25 MG (50000 UNIT) PO CAPS: 50000.0000 [IU] | ORAL_CAPSULE | ORAL | 1 refills | Status: DC

## 2018-05-27 MED ORDER — POTASSIUM CHLORIDE CRYS ER 10 MEQ PO TBCR: 10.0000 meq | EXTENDED_RELEASE_TABLET | Freq: Two times a day (BID) | ORAL | 1 refills | Status: DC

## 2018-05-28 NOTE — Progress Notes (Signed)
Office: [phone]  /  Fax: [phone]   HPI:   Chief Complaint: OBESITY [name] is here to discuss her progress with her obesity treatment plan. She is on the Category 2 plan and is following her eating plan approximately 80 % of the time. She states she is exercising 0 minutes 0 times per week. [name] is planning surgery and she is afraid that she will not get what she needs. She is having left hip surgery 06/11/18 and she will have physical therapy. Her weight is 202 lb (91.6 kg) today and has had a weight loss of 2 pounds over a period of 3 weeks since her last visit. She has lost 23 lbs since starting treatment with [name].  Vitamin D deficiency [name] has a diagnosis of vitamin D deficiency. She is currently taking vit D and denies nausea, vomiting or muscle weakness.  Hypokalemia [name] has a diagnosis of hypokalemia. She is currently on Lasix.  Hypertension [name] is a 58 y.o. female with hypertension.  [name] denies headaches or lightheadedness. She is working weight loss to help control her blood pressure with the goal of decreasing her risk of heart attack and stroke. [name] blood pressure is not that well controlled.  Diabetes II [name] has a diagnosis of diabetes type II. [name] states fasting BGs range between 90 and 120 and 2 hour post prandial BGs are 160. [name] denies any hypoglycemic episodes. Last A1c was at 6.6 She is currently taking Kombiglyze. She has been working on intensive lifestyle modifications including diet, exercise, and weight loss to help control her blood glucose levels.  ASSESSMENT AND PLAN:  Vitamin D deficiency - Plan: Vitamin D, Ergocalciferol, (DRISDOL) 1.25 MG (50000 UT) CAPS capsule  Hypokalemia - Plan: potassium chloride (KLOR-CON M10) 10 MEQ tablet  Essential hypertension  Type 2 diabetes mellitus without complication, without long-term current use of insulin (HCC)  Class 3 severe obesity with serious  comorbidity and body mass index (BMI) of 40.0 to 44.9 in adult, unspecified obesity type ([place])  PLAN:  Vitamin D Deficiency [name] was informed that low vitamin D levels contributes to fatigue and are associated with obesity, breast, and colon cancer. She agrees to continue to take prescription Vit D @50 ,000 IU every week #4 with no refills and will follow up for routine testing of vitamin D, at least 2-3 times per year. She was informed of the risk of over-replacement of vitamin D and agrees to not increase her dose unless she discusses this with [name] first. [name] agrees to follow up as directed.  Hypokalemia [name] agrees to take Klor-Con M10 10 mEq BID #60 with no refills and follow up as directed.  Hypertension We discussed sodium restriction, working on healthy weight loss, and a regular exercise program as the means to achieve improved blood pressure control. [name] agreed with this plan and agreed to follow up as directed. We will continue to monitor her blood pressure as well as her progress with the above lifestyle modifications. She will continue her medications as prescribed and will watch for signs of hypotension as she continues her lifestyle modifications.  Diabetes II [name] has been given extensive diabetes education by myself today including ideal fasting and post-prandial blood glucose readings, individual ideal Hgb A1c goals and hypoglycemia prevention. We discussed the importance of good blood sugar control to decrease the likelihood of diabetic complications such as nephropathy, neuropathy, limb loss, blindness, coronary artery disease, and death. We discussed the importance of intensive lifestyle modification  including diet, exercise and weight loss as the first line treatment for diabetes. Shakeela agrees to continue her diabetes medications and will follow up at the agreed upon time.  Obesity [name] is currently in the action stage of change. As such, her goal is to continue  with weight loss efforts She has agreed to follow the Category 2 plan Takima has been instructed to work up to a goal of 150 minutes of combined cardio and strengthening exercise per week for weight loss and overall health benefits. We discussed the following Behavioral Modification Strategies today: keeping healthy foods in the home, increasing lean protein intake, decreasing simple carbohydrates, increasing vegetables and work on meal planning and easy cooking plans [name] will focus on increasing protein (meat) and vegetables  [name] has agreed to follow up with our clinic in 6 weeks. She was informed of the importance of frequent follow up visits to maximize her success with intensive lifestyle modifications for her multiple health conditions.  ALLERGIES: Allergies  Allergen Reactions  . Penicillins Swelling and Other (See Comments)    Has patient had a PCN reaction causing immediate rash, facial/tongue/throat swelling, SOB or lightheadedness with hypotension: YES Has patient had a PCN reaction causing severe rash involving mucus membranes or skin necrosis: NO Has patient had a PCN reaction that required hospitalization NO Has patient had a PCN reaction occurring within the last 10 years: NO If all of the above answers are "NO", then may proceed with Cephalosporin use.  [name] Hydrocodone-Acetaminophen Itching    confusion  . Latex Rash  . Sulfa Antibiotics Diarrhea, Itching and Rash    MEDICATIONS: Current Outpatient Medications on File Prior to Visit  Medication Sig Dispense Refill  . albuterol (PROVENTIL HFA;VENTOLIN HFA) 108 (90 Base) MCG/ACT inhaler Inhale 2 puffs into the lungs every 6 (six) hours as needed for wheezing or shortness of breath.    [name] amLODipine (NORVASC) 5 MG tablet Take 5 mg by mouth daily.  6  . aspirin EC 81 MG tablet Take 81 mg by mouth daily.    [name] atorvastatin (LIPITOR) 40 MG tablet Take 40 mg by mouth at bedtime.     . benzonatate (TESSALON PERLES) 100 MG  capsule Take 1 capsule (100 mg total) by mouth 3 (three) times daily as needed for cough (cough). 20 capsule 0  . carvedilol (COREG CR) 10 MG 24 hr capsule Take 10 mg by mouth daily.    . cyclobenzaprine (FLEXERIL) 5 MG tablet Take 1 tablet (5 mg total) by mouth at bedtime. 15 tablet 0  . diclofenac sodium (VOLTAREN) 1 % GEL Apply 4 g topically 4 (four) times daily. (Patient taking differently: Apply 4 g topically 2 (two) times daily as needed (for pain). ) 100 g 0  . furosemide (LASIX) 40 MG tablet Take 40 mg by mouth daily.    [name] gabapentin (NEURONTIN) 300 MG capsule Take 2 capsules (600 mg total) by mouth 3 (three) times daily. 180 capsule 2  . HYDROcodone-acetaminophen (NORCO) 5-325 MG tablet Take 1 tablet by mouth 2 (two) times daily as needed. 14 tablet 0  . isosorbide dinitrate (ISORDIL) 20 MG tablet Take 20 mg by mouth 2 (two) times daily.     [name] KOMBIGLYZE XR 5-500 MG TB24 Take 1 tablet by mouth every morning.     . lidocaine (LIDODERM) 5 % Place 1 patch onto the skin daily. Remove & Discard patch within 12 hours or as directed by MD (Patient taking differently: Place 1 patch onto the skin  daily as needed (for pain). Remove & Discard patch within 12 hours or as directed by MD) 30 patch 0  . lisinopril (PRINIVIL,ZESTRIL) 20 MG tablet Take 20 mg by mouth daily.     [name] loperamide (IMODIUM A-D) 2 MG tablet Take 4 mg by mouth 4 (four) times daily as needed for diarrhea or loose stools.     . meloxicam (MOBIC) 15 MG tablet TAKE 1 TABLET BY MOUTH EVERY DAY X2 WEEKS (Patient taking differently: Take 15 mg by mouth daily. ) 30 tablet 0  . MYRBETRIQ 25 MG TB24 tablet Take 25 mg by mouth daily.  5  . omeprazole (PRILOSEC) 20 MG capsule Take 20 mg by mouth 2 (two) times daily.    [name] oxyCODONE-acetaminophen (PERCOCET) 5-325 MG tablet Take 1 tablet by mouth every 6 (six) hours as needed. (Patient taking differently: Take 1 tablet by mouth every 6 (six) hours as needed for severe pain. ) 10 tablet 0  .  sertraline (ZOLOFT) 100 MG tablet Take 100 mg by mouth daily.      No current facility-administered medications on file prior to visit.     PAST MEDICAL HISTORY: Past Medical History:  Diagnosis Date  . Anginal pain ([place])    a. NL cath in 2008;  b. Myoview 03/2011: dec uptake along mid anterior wall on stress imaging -> ? attenuation vs. ischemia, EF 65%;  c. Echo 04/2011: EF 55-60%, no RWMA, Gr 2 dd  . Anxiety   . Arthritis   . Asthma   . Back pain   . Bone cancer ([place])   . Cancer ([place])   . Chest pain   . CHF (congestive heart failure) ([place])   . CHF (congestive heart failure) ([place])   . Depression   . Diabetes mellitus   . Drug use   . Dyspnea    with exertion  . Dyspnea   . Frequent urination   . GERD (gastroesophageal reflux disease)   . Glaucoma   . HLD (hyperlipidemia)   . Hypertension   . IBS (irritable bowel syndrome)   . Joint pain   . Lactose intolerance   . Leg edema   . 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 Obesity   . Palpitations   . Pneumonia 05/2016   double  . Pulmonary edema   . Pulmonary embolism ([place])    a. 2008 -> coumadin x 6 mos.  . Rheumatoid arthritis ([place])   . Sleep apnea    on CPAP 02/2018  . TIA (transient ischemic attack)   . Urinary urgency     PAST SURGICAL HISTORY: Past Surgical History:  Procedure Laterality Date  . ABDOMINAL HYSTERECTOMY  2005  . APPENDECTOMY    . CARDIAC CATHETERIZATION     Normal  . CARDIAC CATHETERIZATION N/A 09/30/2014   Procedure: Left Heart Cath and Coronary Angiography;  Surgeon: Sherren Mocha, MD;  Location: [facility];  Service: Cardiovascular;  Laterality: N/A;  . LAPAROSCOPIC APPENDECTOMY N/A 06/03/2012   Procedure: APPENDECTOMY LAPAROSCOPIC;  Surgeon: Stark Klein, MD;  Location: [facility];  Service: General;  Laterality: N/A;  . Left knee surgery  2008  . LEG SURGERY    . TONSILLECTOMY    . TOTAL KNEE ARTHROPLASTY Left 08/22/2016   Procedure: TOTAL KNEE ARTHROPLASTY;   Surgeon: Meredith Pel, MD;  Location: [facility];  Service: Orthopedics;  Laterality: Left;  . TUBAL LIGATION  1989    SOCIAL HISTORY: Social History  Tobacco Use  . Smoking status: Former Smoker    Packs/day: 0.50    Years: 15.00    Pack years: 7.50    Types: Cigarettes    Last attempt to quit: 04/24/1985    Years since quitting: 33.1  . Smokeless tobacco: Never Used  Substance Use Topics  . Alcohol use: No    Alcohol/week: 0.0 standard drinks  . Drug use: Not Currently    Types: Marijuana    FAMILY HISTORY: Family History  Problem Relation Age of Onset  . Emphysema Mother   . Arthritis Mother   . Heart failure Mother        alive @ 74  . Stroke Mother   . Diabetes Mother   . Hypertension Mother   . Hyperlipidemia Mother   . Depression Mother   . Anxiety disorder Mother   . Asthma Brother   . Heart disease Father        died @ 30's.  . Stroke Father   . Diabetes Father   . Hyperlipidemia Father   . Hypertension Father   . Bipolar disorder Father   . Sleep apnea Father   . Alcoholism Father   . Drug abuse Father   . Diabetes Sister   . Heart disease Paternal Grandfather   . Colon cancer Maternal Grandfather   . Breast cancer Maternal Aunt     ROS: Review of Systems  Constitutional: Positive for weight loss.  Gastrointestinal: Negative for nausea and vomiting.  Musculoskeletal:       Negative for muscle weakness  Neurological: Negative for headaches.       Negative for lightheadedness  Endo/Heme/Allergies:       Negative for hypoglycemia    PHYSICAL EXAM: Blood pressure (!) 150/78, pulse (!) 59, temperature 97.9 F (36.6 C), temperature source Oral, height 4\' 10"  (1.473 m), weight 202 lb (91.6 kg), SpO2 99 %. Body mass index is 42.22 kg/m. Physical Exam Vitals signs reviewed.  Constitutional:      Appearance: Normal appearance. She is well-developed. She is obese.  Cardiovascular:     Rate and Rhythm: Normal rate.  Pulmonary:     Effort:  Pulmonary effort is normal.  Musculoskeletal: Normal range of motion.  Skin:    General: Skin is warm and dry.  Neurological:     Mental Status: She is alert and oriented to person, place, and time.  Psychiatric:        Mood and Affect: Mood normal.        Behavior: Behavior normal.     RECENT LABS AND TESTS: BMET    Component Value Date/Time   NA 142 05/22/2018 1117   NA 144 03/04/2018 1152   K 3.7 05/22/2018 1117   CL 106 05/22/2018 1117   CO2 27 05/22/2018 1117   GLUCOSE 100 (H) 05/22/2018 1117   BUN 16 05/22/2018 1117   BUN 17 03/04/2018 1152   CREATININE 0.83 05/22/2018 1117   CALCIUM 9.4 05/22/2018 1117   GFRNONAA >60 05/22/2018 1117   GFRAA >60 05/22/2018 1117   Lab Results  Component Value Date   HGBA1C 6.6 (H) 05/22/2018   HGBA1C 6.8 (H) 03/04/2018   HGBA1C 8.8 (H) 11/20/2017   HGBA1C 7.4 (H) 08/11/2016   HGBA1C 6.6 (H) 05/21/2016   Lab Results  Component Value Date   INSULIN 20.2 03/04/2018   INSULIN 53.9 (H) 11/20/2017   CBC    Component Value Date/Time   WBC 5.9 05/22/2018 1117   RBC 4.71 05/22/2018 1117  HGB 13.1 05/22/2018 1117   HGB 12.8 11/20/2017 0925   HCT 41.2 05/22/2018 1117   HCT 40.6 11/20/2017 0925   PLT 244 05/22/2018 1117   MCV 87.5 05/22/2018 1117   MCV 85 11/20/2017 0925   MCH 27.8 05/22/2018 1117   MCHC 31.8 05/22/2018 1117   RDW 13.9 05/22/2018 1117   RDW 13.9 11/20/2017 0925   LYMPHSABS 2.1 11/20/2017 0925   MONOABS 0.2 05/21/2016 0800   EOSABS 0.4 11/20/2017 0925   BASOSABS 0.0 11/20/2017 0925   Iron/TIBC/Ferritin/ %Sat No results found for: IRON, TIBC, FERRITIN, IRONPCTSAT Lipid Panel     Component Value Date/Time   CHOL 162 03/04/2018 1152   TRIG 117 03/04/2018 1152   HDL 50 03/04/2018 1152   CHOLHDL 4.2 04/23/2011 0830   CHOLHDL 4.3 04/23/2011 0830   VLDL 29 04/23/2011 0830   VLDL 29 04/23/2011 0830   LDLCALC 89 03/04/2018 1152   Hepatic Function Panel     Component Value Date/Time   PROT 6.4 03/04/2018  1152   ALBUMIN 4.1 03/04/2018 1152   AST 19 03/04/2018 1152   ALT 40 (H) 03/04/2018 1152   ALKPHOS 61 03/04/2018 1152   BILITOT 0.4 03/04/2018 1152   BILIDIR 0.1 05/22/2016 0913   IBILI 0.6 05/22/2016 0913      Component Value Date/Time   TSH 0.638 11/20/2017 0925   TSH 0.112 (L) 05/24/2016 0629   TSH 0.345 (L) 06/18/2014 0930   TSH 0.167 (L) 10/28/2012 0144     Ref. Range 03/04/2018 11:52  Vitamin D, 25-Hydroxy Latest Ref Range: 30.0 - 100.0 ng/mL 34.5     OBESITY BEHAVIORAL INTERVENTION VISIT  Today's visit was # 11   Starting weight: 225 lbs Starting date: 11/20/2017 Today's weight : 202 lbs Today's date: 05/27/2018 Total lbs lost to date: 23 At least 15 minutes were spent on discussing the following behavioral intervention visit.   ASK: We discussed the diagnosis of obesity with [name] today and [name] agreed to give [name] permission to discuss obesity behavioral modification therapy today.  ASSESS: [name] has the diagnosis of obesity and her BMI today is 42.23 [name] is in the action stage of change   ADVISE: [name] was educated on the multiple health risks of obesity as well as the benefit of weight loss to improve her health. She was advised of the need for long term treatment and the importance of lifestyle modifications to improve her current health and to decrease her risk of future health problems.  AGREE: Multiple dietary modification options and treatment options were discussed and  [name] agreed to follow the recommendations documented in the above note.  ARRANGE: [name] was educated on the importance of frequent visits to treat obesity as outlined per CMS and USPSTF guidelines and agreed to schedule her next follow up appointment today.  Corey Skains, am acting as Location manager for [employer]. Owens Shark, DO  I have reviewed the above documentation for accuracy and completeness, and I agree with the above. -Jearld Lesch, DO

## 2018-05-29 DIAGNOSIS — Z6841 Body Mass Index (BMI) 40.0 and over, adult: Secondary | ICD-10-CM

## 2018-05-29 NOTE — Telephone Encounter (Signed)
Called and left pt a detailed message that her surgical clearance will be addressed at her upcoming appt with Dr. Johnsie Cancel 06/05/2018 and if she had any questions, to call the office.  Will route to the requesting surgeons office.

## 2018-05-29 NOTE — Progress Notes (Signed)
Cardiology Office Note   Date:  06/05/2018   ID:  Erilyn, Pearman 03-Dec-1960, MRN [id number]  PCP:  Tsosie Billing, MD  Cardiologist:   [name], MD   No chief complaint on file.     History of Present Illness: [name] is a 58 y.o. female who presents for f/u of atypical chest pain, diastolic CHF, HTN, HLD , DM and morbid obesity. Last seen by PA June 2018  Had nonspecific elevation in troponin when seen in ER for leg pain. Distant history of PE Rx coumadin CT June 2018 no PE off anticoagulation. Atypical chest pains in past with multiple normal caths most recently 2016 Last TTE done 05/22/16 EF 31-54% grade 2 diastolic no valve disease unable to estimate PA pressures  Needs left hip surgery 06/11/18 Has lost 23 lbs since being seen by [facility]   She had her left TKR with Dr Marlou Sa 2 years ago will go to [facility] for rehab   Past Medical History:  Diagnosis Date  . Anginal pain ([place])    a. NL cath in 2008;  b. Myoview 03/2011: dec uptake along mid anterior wall on stress imaging -> ? attenuation vs. ischemia, EF 65%;  c. Echo 04/2011: EF 55-60%, no RWMA, Gr 2 dd  . Anxiety   . Arthritis   . Asthma   . Back pain   . Bone cancer ([place])   . Cancer ([place])   . Chest pain   . CHF (congestive heart failure) ([place])   . CHF (congestive heart failure) ([place])   . Depression   . Diabetes mellitus   . Drug use   . Dyspnea    with exertion  . Dyspnea   . Frequent urination   . GERD (gastroesophageal reflux disease)   . Glaucoma   . HLD (hyperlipidemia)   . Hypertension   . IBS (irritable bowel syndrome)   . Joint pain   . Lactose intolerance   . Leg edema   . 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 Obesity   . Palpitations   . Pneumonia 05/2016   double  . Pulmonary edema   . Pulmonary embolism ([place])    a. 2008 -> coumadin x 6 mos.  . Rheumatoid arthritis ([place])   . Sleep apnea    on CPAP 02/2018  . TIA  (transient ischemic attack)   . Urinary urgency     Past Surgical History:  Procedure Laterality Date  . ABDOMINAL HYSTERECTOMY  2005  . APPENDECTOMY    . CARDIAC CATHETERIZATION     Normal  . CARDIAC CATHETERIZATION N/A 09/30/2014   Procedure: Left Heart Cath and Coronary Angiography;  Surgeon: Sherren Mocha, MD;  Location: [facility];  Service: Cardiovascular;  Laterality: N/A;  . LAPAROSCOPIC APPENDECTOMY N/A 06/03/2012   Procedure: APPENDECTOMY LAPAROSCOPIC;  Surgeon: Stark Klein, MD;  Location: [facility];  Service: General;  Laterality: N/A;  . Left knee surgery  2008  . LEG SURGERY    . TONSILLECTOMY    . TOTAL KNEE ARTHROPLASTY Left 08/22/2016   Procedure: TOTAL KNEE ARTHROPLASTY;  Surgeon: Meredith Pel, MD;  Location: [facility];  Service: Orthopedics;  Laterality: Left;  . TUBAL LIGATION  1989     Current Outpatient Medications  Medication Sig Dispense Refill  . albuterol (PROVENTIL HFA;VENTOLIN HFA) 108 (90 Base) MCG/ACT inhaler Inhale 2 puffs into the lungs every 6 (six) hours as  needed for wheezing or shortness of breath.    [name] amLODipine (NORVASC) 5 MG tablet Take 5 mg by mouth daily.  6  . aspirin EC 81 MG tablet Take 81 mg by mouth daily.    [name] atorvastatin (LIPITOR) 40 MG tablet Take 40 mg by mouth at bedtime.     . benzonatate (TESSALON PERLES) 100 MG capsule Take 1 capsule (100 mg total) by mouth 3 (three) times daily as needed for cough (cough). 20 capsule 0  . carvedilol (COREG CR) 10 MG 24 hr capsule Take 10 mg by mouth daily.    . cyclobenzaprine (FLEXERIL) 5 MG tablet Take 1 tablet (5 mg total) by mouth at bedtime. 15 tablet 0  . diclofenac sodium (VOLTAREN) 1 % GEL Apply 4 g topically 4 (four) times daily. (Patient taking differently: Apply 4 g topically 2 (two) times daily as needed (for pain). ) 100 g 0  . furosemide (LASIX) 40 MG tablet Take 40 mg by mouth daily.    [name] gabapentin (NEURONTIN) 300 MG capsule Take 2 capsules (600 mg total) by mouth 3 (three)  times daily. 180 capsule 2  . isosorbide dinitrate (ISORDIL) 20 MG tablet Take 20 mg by mouth 2 (two) times daily.     [name] KOMBIGLYZE XR 5-500 MG TB24 Take 1 tablet by mouth every morning.     . lidocaine (LIDODERM) 5 % Place 1 patch onto the skin daily. Remove & Discard patch within 12 hours or as directed by MD (Patient taking differently: Place 1 patch onto the skin daily as needed (for pain). Remove & Discard patch within 12 hours or as directed by MD) 30 patch 0  . lisinopril (PRINIVIL,ZESTRIL) 20 MG tablet Take 20 mg by mouth daily.     [name] loperamide (IMODIUM A-D) 2 MG tablet Take 4 mg by mouth 4 (four) times daily as needed for diarrhea or loose stools.     . meloxicam (MOBIC) 15 MG tablet TAKE 1 TABLET BY MOUTH EVERY DAY X2 WEEKS 30 tablet 0  . MYRBETRIQ 25 MG TB24 tablet Take 25 mg by mouth daily.  5  . omeprazole (PRILOSEC) 20 MG capsule Take 20 mg by mouth 2 (two) times daily.    [name] oxyCODONE-acetaminophen (PERCOCET) 5-325 MG tablet Take 1 tablet by mouth every 6 (six) hours as needed. (Patient taking differently: Take 1 tablet by mouth every 6 (six) hours as needed for severe pain. ) 10 tablet 0  . potassium chloride (KLOR-CON M10) 10 MEQ tablet Take 1 tablet (10 mEq total) by mouth 2 (two) times daily. 60 tablet 1  . sertraline (ZOLOFT) 100 MG tablet Take 100 mg by mouth daily.     . Vitamin D, Ergocalciferol, (DRISDOL) 1.25 MG (50000 UT) CAPS capsule Take 1 capsule (50,000 Units total) by mouth every 7 (seven) days. 4 capsule 1   No current facility-administered medications for this visit.     Allergies:   Penicillins; Hydrocodone-acetaminophen; Latex; and Sulfa antibiotics    Social History:  The patient  reports that she quit smoking about 33 years ago. Her smoking use included cigarettes. She has a 7.50 pack-year smoking history. She has never used smokeless tobacco. She reports previous drug use. Drug: Marijuana. She reports that she does not drink alcohol.   Family History:  The  patient's family history includes Alcoholism in her father; Anxiety disorder in her mother; Arthritis in her mother; Asthma in her brother; Bipolar disorder in her father; Breast cancer in her maternal aunt; Colon  cancer in her maternal grandfather; Depression in her mother; Diabetes in her father, mother, and sister; Drug abuse in her father; Emphysema in her mother; Heart disease in her father and paternal grandfather; Heart failure in her mother; Hyperlipidemia in her father and mother; Hypertension in her father and mother; Sleep apnea in her father; Stroke in her father and mother.    ROS:  Please see the history of present illness.   Otherwise, review of systems are positive for none.   All other systems are reviewed and negative.    PHYSICAL EXAM: VS:  BP 138/84   Pulse 80   Ht 4\' 10"  (1.473 m)   Wt 211 lb (95.7 kg)   SpO2 98%   BMI 44.10 kg/m  , BMI Body mass index is 44.1 kg/m. Affect appropriate Obese black female  HEENT: normal Neck supple with no adenopathy JVP normal no bruits no thyromegaly Lungs clear with no wheezing and good diaphragmatic motion Heart:  S1/S2 no murmur, no rub, gallop or click PMI normal Abdomen: benighn, BS positve, no tenderness, no AAA no bruit.  No HSM or HJR Distal pulses intact with no bruits No edema Neuro non-focal Skin warm and dry LEFT TKR     EKG:  04/28/18 SR ICLBBB LVH chronic inferior lateral T wave inversions    Recent Labs: 11/20/2017: TSH 0.638 03/04/2018: ALT 40 05/22/2018: BUN 16; Creatinine, Ser 0.83; Hemoglobin 13.1; Platelets 244; Potassium 3.7; Sodium 142    Lipid Panel    Component Value Date/Time   CHOL 162 03/04/2018 1152   TRIG 117 03/04/2018 1152   HDL 50 03/04/2018 1152   CHOLHDL 4.2 04/23/2011 0830   CHOLHDL 4.3 04/23/2011 0830   VLDL 29 04/23/2011 0830   VLDL 29 04/23/2011 0830   LDLCALC 89 03/04/2018 1152      Wt Readings from Last 3 Encounters:  06/05/18 211 lb (95.7 kg)  05/27/18 202 lb (91.6 kg)   05/22/18 208 lb 12.8 oz (94.7 kg)      Other studies Reviewed: Additional studies/ records that were reviewed today include: Notes from primary , ortho weight loss center Cardiology notes cath x 2 TTE.    ASSESSMENT AND PLAN:  1.  Diastolic Dysfunction related to obesity, HTN and DM continue lasix stable 2.  HTN:  Well controlled.  Continue current medications and low sodium Dash type diet.   3. HLD:  Continue statin labs with primary  4. Ortho  Clear to have total left hip replacement Meticulous DVT prophylaxis given obesity and history of PE 5. DM:  Discussed low carb diet.  Target hemoglobin A1c is 6.5 or less.  Continue current medications.    Current medicines are reviewed at length with the patient today.  The patient does not have concerns regarding medicines.  The following changes have been made:  no change  Labs/ tests ordered today include: None  No orders of the defined types were placed in this encounter.    Disposition:   FU with cardiology PRN      Signed, [name], MD  06/05/2018 9:05 AM    [address], [place], [place]  [address] Phone: [phone]; Fax: [phone]

## 2018-05-29 NOTE — Telephone Encounter (Signed)
   Primary Cardiologist:Peter Johnsie Cancel, MD  Chart reviewed as part of pre-operative protocol coverage. Because of Dametria Tuzzolino Begley's past medical history and time since last visit, he/she will require a follow-up visit in order to better assess preoperative cardiovascular risk.  Pre-op covering staff: - Please schedule appointment and call patient to inform them. - Please contact requesting surgeon's office via preferred method (i.e, phone, fax) to inform them of need for appointment prior to surgery.  If applicable, this message will also be routed to pharmacy pool and/or primary cardiologist for input on holding anticoagulant/antiplatelet agent as requested below so that this information is available at time of patient's appointment.   Cecilie Kicks, NP  05/29/2018, 4:14 PM

## 2018-06-03 ENCOUNTER — Other Ambulatory Visit (INDEPENDENT_AMBULATORY_CARE_PROVIDER_SITE_OTHER): Payer: Self-pay | Admitting: Orthopedic Surgery

## 2018-06-03 NOTE — Telephone Encounter (Signed)
Ok for refill? 

## 2018-06-03 NOTE — Telephone Encounter (Signed)
y

## 2018-06-04 ENCOUNTER — Telehealth (INDEPENDENT_AMBULATORY_CARE_PROVIDER_SITE_OTHER): Payer: Self-pay | Admitting: Orthopedic Surgery

## 2018-06-04 NOTE — Telephone Encounter (Signed)
Please advise 

## 2018-06-04 NOTE — Telephone Encounter (Signed)
[facility] [address]   Patient called wanted to inform Dr.Dean that her meloxicam is not working, patient would like oxycodone instead.

## 2018-06-05 ENCOUNTER — Encounter: Payer: Self-pay | Admitting: Cardiovascular Disease

## 2018-06-05 ENCOUNTER — Ambulatory Visit (INDEPENDENT_AMBULATORY_CARE_PROVIDER_SITE_OTHER): Payer: Medicare Other | Admitting: Cardiovascular Disease

## 2018-06-05 VITALS — BP 138/84 | HR 80 | Ht <= 58 in | Wt 211.0 lb

## 2018-06-05 DIAGNOSIS — I10 Essential (primary) hypertension: Secondary | ICD-10-CM | POA: Diagnosis not present

## 2018-06-05 DIAGNOSIS — I5032 Chronic diastolic (congestive) heart failure: Secondary | ICD-10-CM | POA: Diagnosis not present

## 2018-06-05 DIAGNOSIS — E785 Hyperlipidemia, unspecified: Secondary | ICD-10-CM | POA: Diagnosis not present

## 2018-06-05 NOTE — Telephone Encounter (Signed)
Okay for tramadol 1 p.o. 3 times daily as needed pain.  I would not do oxycodone prior to the procedure because her pain will be worse afterwards #30

## 2018-06-05 NOTE — Patient Instructions (Signed)
Medication Instructions:   If you need a refill on your cardiac medications before your next appointment, please call your pharmacy.   Lab work:  If you have labs (blood work) drawn today and your tests are completely normal, you will receive your results only by: . MyChart Message (if you have MyChart) OR . A paper copy in the mail If you have any lab test that is abnormal or we need to change your treatment, we will call you to review the results.  Testing/Procedures: None ordered today.  Follow-Up: At [facility], you and your health needs are our priority.  As part of our continuing mission to provide you with exceptional heart care, we have created designated Provider Care Teams.  These Care Teams include your primary Cardiologist (physician) and Advanced Practice Providers (APPs -  Physician Assistants and Nurse Practitioners) who all work together to provide you with the care you need, when you need it. Your physician recommends that you schedule a follow-up appointment as needed with Dr. Nishan.   

## 2018-06-06 NOTE — Telephone Encounter (Signed)
Called patient back and advised on message below. She states Tramadol does not help.

## 2018-06-06 NOTE — Telephone Encounter (Signed)
Ok for norco 5 325 1 po q 12 3 30  PLS [facility]

## 2018-06-07 NOTE — Telephone Encounter (Signed)
Can you please please call this into the pharmacy for Dr. Marlou Sa. He is out of the office this PM. Patient is scheduled for Hip SU 06/11/2018. Thank you.

## 2018-06-07 NOTE — Anesthesia Preprocedure Evaluation (Addendum)
Anesthesia Evaluation  Patient identified by MRN, date of birth, ID band Patient awake    Reviewed: Allergy & Precautions, NPO status , Patient's Chart, lab work & pertinent test results, reviewed documented beta blocker date and time   Airway Mallampati: II  TM Distance: >3 FB Neck ROM: Full    Dental no notable dental hx. (+) Teeth Intact, Dental Advisory Given   Pulmonary shortness of breath and with exertion, asthma , sleep apnea and Continuous Positive Airway Pressure Ventilation , former smoker, PE (2008)   Pulmonary exam normal breath sounds clear to auscultation       Cardiovascular hypertension, Pt. on medications and Pt. on home beta blockers + angina +CHF  Normal cardiovascular exam Rhythm:Regular Rate:Normal  EKG: 04/27/18: NSR, rightward axis. Incomplete left BBB. ST/T wave abnormality, consider inferior ischemia. Overall, EKG appears stable when compared to previously tracing dating back to at least 03/27/17.   Echo 05/22/16: Study Conclusions: - Left ventricle: The cavity size was normal. Wall thickness wasnormal. Systolic function was normal. The estimated ejectionfraction was in the range of 55% to 60%. Although no diagnosticregional wall motion abnormality was identified, this possibilitycannot be completely excluded on the basis of this study.Features are consistent with a pseudonormal left ventricularfilling pattern, with concomitant abnormal relaxation andincreased filling pressure (grade 2 diastolic dysfunction). - Aortic valve: There was no stenosis. - Mitral valve: There was no significant regurgitation. - Right ventricle: The cavity size was normal. Systolic functionwas normal. - Pulmonary arteries: No complete TR doppler jet so unable toestimate PA systolic pressure. - Systemic veins: IVC not visualized. Impressions:  - Normal LV size with EF 55-60%. Moderate diastolic dysfunction. Normal RV size and  systolic function. No significant valvularabnormalities.  Cardiac cath 09/30/14: ANGIOGRAPHICALLY NORMAL CORONARY ARTERIES NORMAL LV FUNCTION ELEVATED LVEDP   Neuro/Psych PSYCHIATRIC DISORDERS Anxiety Depression TIA   GI/Hepatic Neg liver ROS, GERD  ,  Endo/Other  diabetes, Type 2Morbid obesity  Renal/GU negative Renal ROS  negative genitourinary   Musculoskeletal  (+) Arthritis ,   Abdominal   Peds  Hematology negative hematology ROS (+)   Anesthesia Other Findings   Reproductive/Obstetrics                           Anesthesia Physical Anesthesia Plan  ASA: III  Anesthesia Plan: Spinal   Post-op Pain Management:    Induction:   PONV Risk Score and Plan: 2 and Treatment may vary due to age or medical condition, Propofol infusion, Midazolam, Ondansetron and Dexamethasone  Airway Management Planned: Natural Airway and Simple Face Mask  Additional Equipment:   Intra-op Plan:   Post-operative Plan:   Informed Consent: I have reviewed the patients History and Physical, chart, labs and discussed the procedure including the risks, benefits and alternatives for the proposed anesthesia with the patient or authorized representative who has indicated his/her understanding and acceptance.     Dental advisory given  Plan Discussed with: CRNA  Anesthesia Plan Comments: (PAT note written by Myra Gianotti, PA-C. Cased rescheduled for 06/11/18. Has cardiology clearance. Repeat CBC, BMET 06/10/18 WNL. )     Anesthesia Quick Evaluation

## 2018-06-10 ENCOUNTER — Encounter (HOSPITAL_COMMUNITY)
Admission: RE | Admit: 2018-06-10 | Discharge: 2018-06-10 | Disposition: A | Discharge status: Home / Self Care | Payer: Medicare Other | Source: Ambulatory Visit | Attending: Orthopedic Surgery | Admitting: Orthopedic Surgery

## 2018-06-10 DIAGNOSIS — Z01812 Encounter for preprocedural laboratory examination: Secondary | ICD-10-CM

## 2018-06-10 LAB — BASIC METABOLIC PANEL
Anion gap: 8 (ref 5–15)
BUN: 14 mg/dL (ref 6–20)
CO2: 29 mmol/L (ref 22–32)
Calcium: 9.8 mg/dL (ref 8.9–10.3)
Chloride: 105 mmol/L (ref 98–111)
Creatinine, Ser: 0.85 mg/dL (ref 0.44–1.00)
GFR calc Af Amer: >60 mL/min (ref >60)
GFR calc non Af Amer: >60 mL/min (ref >60)
Glucose, Bld: 86 mg/dL (ref 70–99)
Potassium: 3.9 mmol/L (ref 3.5–5.1)
Sodium: 142 mmol/L (ref 135–145)

## 2018-06-10 LAB — CBC
HCT: 40.5 % (ref 36.0–46.0)
Hemoglobin: 12.8 g/dL (ref 12.0–15.0)
MCH: 27.9 pg (ref 26.0–34.0)
MCHC: 31.6 g/dL (ref 30.0–36.0)
MCV: 88.2 fL (ref 80.0–100.0)
Platelets: 226 10*3/uL (ref 150–400)
RBC: 4.59 MIL/uL (ref 3.87–5.11)
RDW: 13.7 % (ref 11.5–15.5)
WBC: 5.6 10*3/uL (ref 4.0–10.5)
nRBC: 0 % (ref 0.0–0.2)

## 2018-06-10 NOTE — Telephone Encounter (Signed)
Still not taken care of. Do you mind sending this in for patient please. SU tomorrow.

## 2018-06-10 NOTE — Telephone Encounter (Signed)
Sorry I did not see this until today.  Was in sx until last Friday.  Assuming dean is taking care of it at this point

## 2018-06-11 ENCOUNTER — Encounter (HOSPITAL_COMMUNITY)
Admission: AD | Disposition: A | Discharge status: Skilled Nursing Facility | Payer: Self-pay | Source: Home / Self Care | Attending: Orthopedic Surgery

## 2018-06-11 ENCOUNTER — Ambulatory Visit (HOSPITAL_COMMUNITY): Payer: Medicare Other | Admitting: Vascular Surgery

## 2018-06-11 ENCOUNTER — Ambulatory Visit (HOSPITAL_COMMUNITY): Payer: Medicare Other

## 2018-06-11 ENCOUNTER — Ambulatory Visit (HOSPITAL_COMMUNITY): Payer: Medicare Other | Admitting: Physician Assistant

## 2018-06-11 ENCOUNTER — Encounter (HOSPITAL_COMMUNITY): Payer: Self-pay | Admitting: Certified Registered"

## 2018-06-11 ENCOUNTER — Inpatient Hospital Stay (HOSPITAL_COMMUNITY): Payer: Medicare Other

## 2018-06-11 ENCOUNTER — Inpatient Hospital Stay (HOSPITAL_COMMUNITY)
Admission: AD | Admit: 2018-06-11 | Discharge: 2018-06-15 | DRG: 470 | Disposition: A | Discharge status: Skilled Nursing Facility | Payer: Medicare Other | Attending: Orthopedic Surgery | Admitting: Orthopedic Surgery

## 2018-06-11 DIAGNOSIS — Z885 Allergy status to narcotic agent status: Secondary | ICD-10-CM

## 2018-06-11 DIAGNOSIS — M549 Dorsalgia, unspecified: Secondary | ICD-10-CM | POA: Diagnosis present

## 2018-06-11 DIAGNOSIS — Z91040 Latex allergy status: Secondary | ICD-10-CM

## 2018-06-11 DIAGNOSIS — Z9049 Acquired absence of other specified parts of digestive tract: Secondary | ICD-10-CM | POA: Diagnosis not present

## 2018-06-11 DIAGNOSIS — K589 Irritable bowel syndrome without diarrhea: Secondary | ICD-10-CM | POA: Diagnosis present

## 2018-06-11 DIAGNOSIS — I5032 Chronic diastolic (congestive) heart failure: Secondary | ICD-10-CM | POA: Diagnosis present

## 2018-06-11 DIAGNOSIS — I447 Left bundle-branch block, unspecified: Secondary | ICD-10-CM | POA: Diagnosis present

## 2018-06-11 DIAGNOSIS — H409 Unspecified glaucoma: Secondary | ICD-10-CM | POA: Diagnosis present

## 2018-06-11 DIAGNOSIS — Z882 Allergy status to sulfonamides status: Secondary | ICD-10-CM

## 2018-06-11 DIAGNOSIS — Z419 Encounter for procedure for purposes other than remedying health state, unspecified: Secondary | ICD-10-CM

## 2018-06-11 DIAGNOSIS — G4733 Obstructive sleep apnea (adult) (pediatric): Secondary | ICD-10-CM | POA: Diagnosis present

## 2018-06-11 DIAGNOSIS — J45909 Unspecified asthma, uncomplicated: Secondary | ICD-10-CM | POA: Diagnosis present

## 2018-06-11 DIAGNOSIS — M1612 Unilateral primary osteoarthritis, left hip: Secondary | ICD-10-CM

## 2018-06-11 DIAGNOSIS — F411 Generalized anxiety disorder: Secondary | ICD-10-CM | POA: Diagnosis present

## 2018-06-11 DIAGNOSIS — F329 Major depressive disorder, single episode, unspecified: Secondary | ICD-10-CM | POA: Diagnosis present

## 2018-06-11 DIAGNOSIS — I110 Hypertensive heart disease with heart failure: Secondary | ICD-10-CM | POA: Diagnosis present

## 2018-06-11 DIAGNOSIS — Z9181 History of falling: Secondary | ICD-10-CM | POA: Diagnosis not present

## 2018-06-11 DIAGNOSIS — Z7982 Long term (current) use of aspirin: Secondary | ICD-10-CM

## 2018-06-11 DIAGNOSIS — E7849 Other hyperlipidemia: Secondary | ICD-10-CM | POA: Diagnosis present

## 2018-06-11 DIAGNOSIS — M069 Rheumatoid arthritis, unspecified: Secondary | ICD-10-CM | POA: Diagnosis present

## 2018-06-11 DIAGNOSIS — E785 Hyperlipidemia, unspecified: Secondary | ICD-10-CM | POA: Diagnosis present

## 2018-06-11 DIAGNOSIS — E119 Type 2 diabetes mellitus without complications: Secondary | ICD-10-CM | POA: Diagnosis present

## 2018-06-11 DIAGNOSIS — Z6841 Body Mass Index (BMI) 40.0 and over, adult: Secondary | ICD-10-CM

## 2018-06-11 DIAGNOSIS — Z880 Allergy status to penicillin: Secondary | ICD-10-CM

## 2018-06-11 DIAGNOSIS — Z8673 Personal history of transient ischemic attack (TIA), and cerebral infarction without residual deficits: Secondary | ICD-10-CM

## 2018-06-11 DIAGNOSIS — Z90710 Acquired absence of both cervix and uterus: Secondary | ICD-10-CM

## 2018-06-11 DIAGNOSIS — Z96652 Presence of left artificial knee joint: Secondary | ICD-10-CM | POA: Diagnosis present

## 2018-06-11 DIAGNOSIS — K219 Gastro-esophageal reflux disease without esophagitis: Secondary | ICD-10-CM | POA: Diagnosis present

## 2018-06-11 DIAGNOSIS — Z86711 Personal history of pulmonary embolism: Secondary | ICD-10-CM

## 2018-06-11 DIAGNOSIS — Z87891 Personal history of nicotine dependence: Secondary | ICD-10-CM | POA: Diagnosis not present

## 2018-06-11 DIAGNOSIS — M1610 Unilateral primary osteoarthritis, unspecified hip: Secondary | ICD-10-CM | POA: Diagnosis present

## 2018-06-11 DIAGNOSIS — Z79899 Other long term (current) drug therapy: Secondary | ICD-10-CM

## 2018-06-11 HISTORY — PX: TOTAL HIP ARTHROPLASTY: SHX124

## 2018-06-11 LAB — GLUCOSE, CAPILLARY
Glucose-Capillary: 122 mg/dL — ABNORMAL HIGH (ref 70–99)
Glucose-Capillary: 118 mg/dL — ABNORMAL HIGH (ref 70–99)
Glucose-Capillary: 134 mg/dL — ABNORMAL HIGH (ref 70–99)

## 2018-06-11 SURGERY — ARTHROPLASTY, HIP, TOTAL, ANTERIOR APPROACH
Anesthesia: Spinal | Laterality: Left

## 2018-06-11 MED ORDER — SODIUM CHLORIDE 0.9 % IV SOLN
INTRAVENOUS | Status: DC | PRN
  Administered 2018-06-11: 25 ug/min via INTRAVENOUS

## 2018-06-11 MED ORDER — LIDOCAINE 2% (20 MG/ML) 5 ML SYRINGE
INTRAMUSCULAR | Status: AC
  Filled 2018-06-11: qty 5

## 2018-06-11 MED ORDER — ISOSORBIDE DINITRATE 20 MG PO TABS
20.0000 mg | ORAL_TABLET | Freq: Two times a day (BID) | ORAL | Status: DC
  Administered 2018-06-11 – 2018-06-15 (×8): 20 mg via ORAL
  Filled 2018-06-11 (×9): qty 1

## 2018-06-11 MED ORDER — CHLORHEXIDINE GLUCONATE 4 % EX LIQD: 60.0000 mL | Freq: Once | CUTANEOUS | Status: DC

## 2018-06-11 MED ORDER — FUROSEMIDE 40 MG PO TABS
40.0000 mg | ORAL_TABLET | Freq: Every day | ORAL | Status: DC
  Administered 2018-06-11 – 2018-06-15 (×5): 40 mg via ORAL
  Filled 2018-06-11 (×5): qty 1

## 2018-06-11 MED ORDER — DEXAMETHASONE SODIUM PHOSPHATE 10 MG/ML IJ SOLN
INTRAMUSCULAR | Status: AC
  Filled 2018-06-11: qty 1

## 2018-06-11 MED ORDER — PROPOFOL 1000 MG/100ML IV EMUL
INTRAVENOUS | Status: AC
  Filled 2018-06-11: qty 100

## 2018-06-11 MED ORDER — FENTANYL CITRATE (PF) 100 MCG/2ML IJ SOLN
25.0000 ug | INTRAMUSCULAR | Status: DC | PRN
  Administered 2018-06-11: 50 ug via INTRAVENOUS

## 2018-06-11 MED ORDER — METHOCARBAMOL 1000 MG/10ML IJ SOLN
500.0000 mg | Freq: Four times a day (QID) | INTRAVENOUS | Status: DC | PRN
  Filled 2018-06-11: qty 5

## 2018-06-11 MED ORDER — VANCOMYCIN HCL IN DEXTROSE 1-5 GM/200ML-% IV SOLN
1000.0000 mg | Freq: Two times a day (BID) | INTRAVENOUS | Status: AC
  Administered 2018-06-11: 1000 mg via INTRAVENOUS
  Filled 2018-06-11: qty 200

## 2018-06-11 MED ORDER — VITAMIN D (ERGOCALCIFEROL) 1.25 MG (50000 UNIT) PO CAPS: 50000.0000 [IU] | ORAL_CAPSULE | ORAL | Status: DC

## 2018-06-11 MED ORDER — ONDANSETRON HCL 4 MG PO TABS: 4.0000 mg | ORAL_TABLET | Freq: Four times a day (QID) | ORAL | Status: DC | PRN

## 2018-06-11 MED ORDER — ONDANSETRON HCL 4 MG/2ML IJ SOLN
INTRAMUSCULAR | Status: AC
  Filled 2018-06-11: qty 2

## 2018-06-11 MED ORDER — TRAMADOL HCL 50 MG PO TABS
50.0000 mg | ORAL_TABLET | Freq: Four times a day (QID) | ORAL | Status: DC
  Administered 2018-06-11 – 2018-06-14 (×10): 50 mg via ORAL
  Filled 2018-06-11 (×10): qty 1

## 2018-06-11 MED ORDER — PROPOFOL 10 MG/ML IV BOLUS
INTRAVENOUS | Status: AC
  Filled 2018-06-11: qty 20

## 2018-06-11 MED ORDER — ACETAMINOPHEN 325 MG PO TABS: 325.0000 mg | ORAL_TABLET | Freq: Four times a day (QID) | ORAL | Status: DC | PRN

## 2018-06-11 MED ORDER — PHENYLEPHRINE 40 MCG/ML (10ML) SYRINGE FOR IV PUSH (FOR BLOOD PRESSURE SUPPORT)
PREFILLED_SYRINGE | INTRAVENOUS | Status: AC
  Filled 2018-06-11: qty 10

## 2018-06-11 MED ORDER — PROPOFOL 500 MG/50ML IV EMUL
INTRAVENOUS | Status: DC | PRN
  Administered 2018-06-11: 75 ug/kg/min via INTRAVENOUS

## 2018-06-11 MED ORDER — LACTATED RINGERS IV SOLN
INTRAVENOUS | Status: DC
  Administered 2018-06-11 (×2): via INTRAVENOUS

## 2018-06-11 MED ORDER — AMLODIPINE BESYLATE 5 MG PO TABS
5.0000 mg | ORAL_TABLET | Freq: Every day | ORAL | Status: DC
  Administered 2018-06-12 – 2018-06-15 (×3): 5 mg via ORAL
  Filled 2018-06-11 (×5): qty 1

## 2018-06-11 MED ORDER — METHOCARBAMOL 500 MG PO TABS
500.0000 mg | ORAL_TABLET | Freq: Four times a day (QID) | ORAL | Status: DC | PRN
  Administered 2018-06-11 – 2018-06-15 (×5): 500 mg via ORAL
  Filled 2018-06-11 (×5): qty 1

## 2018-06-11 MED ORDER — VANCOMYCIN HCL 1000 MG IV SOLR
INTRAVENOUS | Status: AC
  Filled 2018-06-11: qty 1000

## 2018-06-11 MED ORDER — OXYCODONE HCL 5 MG PO TABS
5.0000 mg | ORAL_TABLET | ORAL | Status: DC | PRN
  Administered 2018-06-11 – 2018-06-15 (×10): 10 mg via ORAL
  Filled 2018-06-11 (×10): qty 2

## 2018-06-11 MED ORDER — VANCOMYCIN HCL IN DEXTROSE 1-5 GM/200ML-% IV SOLN
1000.0000 mg | INTRAVENOUS | Status: AC
  Administered 2018-06-11: 1000 mg via INTRAVENOUS
  Filled 2018-06-11: qty 200

## 2018-06-11 MED ORDER — LINAGLIPTIN 5 MG PO TABS
5.0000 mg | ORAL_TABLET | ORAL | Status: DC
  Administered 2018-06-12 – 2018-06-15 (×4): 5 mg via ORAL
  Filled 2018-06-11 (×4): qty 1

## 2018-06-11 MED ORDER — DOCUSATE SODIUM 100 MG PO CAPS
100.0000 mg | ORAL_CAPSULE | Freq: Two times a day (BID) | ORAL | Status: DC
  Administered 2018-06-11 – 2018-06-15 (×9): 100 mg via ORAL
  Filled 2018-06-11 (×9): qty 1

## 2018-06-11 MED ORDER — PANTOPRAZOLE SODIUM 40 MG PO TBEC
40.0000 mg | DELAYED_RELEASE_TABLET | Freq: Every day | ORAL | Status: DC
  Administered 2018-06-12 – 2018-06-15 (×4): 40 mg via ORAL
  Filled 2018-06-11 (×5): qty 1

## 2018-06-11 MED ORDER — ACETAMINOPHEN 500 MG PO TABS
1000.0000 mg | ORAL_TABLET | Freq: Once | ORAL | Status: AC
  Administered 2018-06-11: 1000 mg via ORAL
  Filled 2018-06-11: qty 2

## 2018-06-11 MED ORDER — MIDAZOLAM HCL 2 MG/2ML IJ SOLN
INTRAMUSCULAR | Status: AC
  Filled 2018-06-11: qty 2

## 2018-06-11 MED ORDER — LOPERAMIDE HCL 2 MG PO CAPS: 4.0000 mg | ORAL_CAPSULE | Freq: Four times a day (QID) | ORAL | Status: DC | PRN

## 2018-06-11 MED ORDER — CARVEDILOL PHOSPHATE ER 10 MG PO CP24
10.0000 mg | ORAL_CAPSULE | Freq: Every day | ORAL | Status: DC
  Administered 2018-06-12 – 2018-06-15 (×3): 10 mg via ORAL
  Filled 2018-06-11 (×5): qty 1

## 2018-06-11 MED ORDER — METOCLOPRAMIDE HCL 5 MG PO TABS: 5.0000 mg | ORAL_TABLET | Freq: Three times a day (TID) | ORAL | Status: DC | PRN

## 2018-06-11 MED ORDER — METOCLOPRAMIDE HCL 5 MG/ML IJ SOLN: 5.0000 mg | Freq: Three times a day (TID) | INTRAMUSCULAR | Status: DC | PRN

## 2018-06-11 MED ORDER — GABAPENTIN 300 MG PO CAPS
600.0000 mg | ORAL_CAPSULE | Freq: Three times a day (TID) | ORAL | Status: DC
  Administered 2018-06-11 – 2018-06-15 (×12): 600 mg via ORAL
  Filled 2018-06-11 (×12): qty 2

## 2018-06-11 MED ORDER — RIVAROXABAN 10 MG PO TABS
10.0000 mg | ORAL_TABLET | Freq: Every day | ORAL | Status: DC
  Administered 2018-06-12 – 2018-06-15 (×4): 10 mg via ORAL
  Filled 2018-06-11 (×4): qty 1

## 2018-06-11 MED ORDER — ATORVASTATIN CALCIUM 40 MG PO TABS
40.0000 mg | ORAL_TABLET | Freq: Every day | ORAL | Status: DC
  Administered 2018-06-11 – 2018-06-14 (×4): 40 mg via ORAL
  Filled 2018-06-11 (×4): qty 1

## 2018-06-11 MED ORDER — HYDROMORPHONE HCL 1 MG/ML IJ SOLN: 0.5000 mg | INTRAMUSCULAR | Status: DC | PRN

## 2018-06-11 MED ORDER — LACTATED RINGERS IV SOLN: INTRAVENOUS | Status: DC | PRN

## 2018-06-11 MED ORDER — SERTRALINE HCL 100 MG PO TABS
100.0000 mg | ORAL_TABLET | Freq: Every day | ORAL | Status: DC
  Administered 2018-06-12 – 2018-06-15 (×4): 100 mg via ORAL
  Filled 2018-06-11 (×5): qty 1

## 2018-06-11 MED ORDER — VANCOMYCIN HCL 1000 MG IV SOLR
INTRAVENOUS | Status: DC | PRN
  Administered 2018-06-11: 1000 mg via TOPICAL

## 2018-06-11 MED ORDER — TRANEXAMIC ACID-NACL 1000-0.7 MG/100ML-% IV SOLN
INTRAVENOUS | Status: DC | PRN
  Administered 2018-06-11: 1000 mg via INTRAVENOUS

## 2018-06-11 MED ORDER — FENTANYL CITRATE (PF) 250 MCG/5ML IJ SOLN
INTRAMUSCULAR | Status: AC
  Filled 2018-06-11: qty 5

## 2018-06-11 MED ORDER — PROPOFOL 10 MG/ML IV BOLUS
INTRAVENOUS | Status: DC | PRN
  Administered 2018-06-11: 10 mg via INTRAVENOUS
  Administered 2018-06-11: 20 mg via INTRAVENOUS
  Administered 2018-06-11 (×2): 10 mg via INTRAVENOUS

## 2018-06-11 MED ORDER — LACTATED RINGERS IV SOLN
INTRAVENOUS | Status: AC
  Administered 2018-06-11: 15:00:00 via INTRAVENOUS

## 2018-06-11 MED ORDER — TRANEXAMIC ACID-NACL 1000-0.7 MG/100ML-% IV SOLN
INTRAVENOUS | Status: AC
  Filled 2018-06-11: qty 100

## 2018-06-11 MED ORDER — ONDANSETRON HCL 4 MG/2ML IJ SOLN: 4.0000 mg | Freq: Four times a day (QID) | INTRAMUSCULAR | Status: DC | PRN

## 2018-06-11 MED ORDER — PHENOL 1.4 % MT LIQD: 1.0000 | OROMUCOSAL | Status: DC | PRN

## 2018-06-11 MED ORDER — FENTANYL CITRATE (PF) 100 MCG/2ML IJ SOLN
INTRAMUSCULAR | Status: AC
  Filled 2018-06-11: qty 2

## 2018-06-11 MED ORDER — MIDAZOLAM HCL 5 MG/5ML IJ SOLN
INTRAMUSCULAR | Status: DC | PRN
  Administered 2018-06-11: 2 mg via INTRAVENOUS

## 2018-06-11 MED ORDER — DEXAMETHASONE SODIUM PHOSPHATE 10 MG/ML IJ SOLN
INTRAMUSCULAR | Status: DC | PRN
  Administered 2018-06-11: 4 mg via INTRAVENOUS

## 2018-06-11 MED ORDER — ONDANSETRON HCL 4 MG/2ML IJ SOLN
INTRAMUSCULAR | Status: DC | PRN
  Administered 2018-06-11: 4 mg via INTRAVENOUS

## 2018-06-11 MED ORDER — MENTHOL 3 MG MT LOZG: 1.0000 | LOZENGE | OROMUCOSAL | Status: DC | PRN

## 2018-06-11 MED ORDER — 0.9 % SODIUM CHLORIDE (POUR BTL) OPTIME
TOPICAL | Status: DC | PRN
  Administered 2018-06-11 (×2): 1000 mL

## 2018-06-11 MED ORDER — METFORMIN HCL ER 500 MG PO TB24
500.0000 mg | ORAL_TABLET | ORAL | Status: DC
  Administered 2018-06-12 – 2018-06-15 (×4): 500 mg via ORAL
  Filled 2018-06-11 (×4): qty 1

## 2018-06-11 MED ORDER — POTASSIUM CHLORIDE CRYS ER 10 MEQ PO TBCR
10.0000 meq | EXTENDED_RELEASE_TABLET | Freq: Two times a day (BID) | ORAL | Status: DC
  Administered 2018-06-11 – 2018-06-15 (×9): 10 meq via ORAL
  Filled 2018-06-11 (×9): qty 1

## 2018-06-11 MED ORDER — EPHEDRINE 5 MG/ML INJ
INTRAVENOUS | Status: AC
  Filled 2018-06-11: qty 10

## 2018-06-11 MED ORDER — BENZONATATE 100 MG PO CAPS: 100.0000 mg | ORAL_CAPSULE | Freq: Three times a day (TID) | ORAL | Status: DC | PRN

## 2018-06-11 MED ORDER — ALBUTEROL SULFATE (2.5 MG/3ML) 0.083% IN NEBU: 3.0000 mL | INHALATION_SOLUTION | Freq: Four times a day (QID) | RESPIRATORY_TRACT | Status: DC | PRN

## 2018-06-11 MED ORDER — LISINOPRIL 20 MG PO TABS
20.0000 mg | ORAL_TABLET | Freq: Every day | ORAL | Status: DC
  Administered 2018-06-12 – 2018-06-15 (×4): 20 mg via ORAL
  Filled 2018-06-11 (×5): qty 1

## 2018-06-11 MED ORDER — SAXAGLIPTIN-METFORMIN ER 5-500 MG PO TB24: 1.0000 | ORAL_TABLET | ORAL | Status: DC

## 2018-06-11 MED ORDER — MIRABEGRON ER 25 MG PO TB24
25.0000 mg | ORAL_TABLET | Freq: Every day | ORAL | Status: DC
  Administered 2018-06-12 – 2018-06-15 (×4): 25 mg via ORAL
  Filled 2018-06-11 (×5): qty 1

## 2018-06-11 MED ORDER — SODIUM CHLORIDE 0.9 % IR SOLN
Status: DC | PRN
  Administered 2018-06-11: 3000 mL

## 2018-06-11 SURGICAL SUPPLY — 56 items
BAG DECANTER FOR FLEXI CONT (MISCELLANEOUS) ×2 IMPLANT
BLADE CLIPPER SURG (BLADE) ×2 IMPLANT
BLADE SAW SGTL 18X1.27X75 (BLADE) ×2 IMPLANT
CELLS DAT CNTRL 66122 CELL SVR (MISCELLANEOUS) ×1 IMPLANT
COVER PERINEAL POST (MISCELLANEOUS) ×2 IMPLANT
COVER SURGICAL LIGHT HANDLE (MISCELLANEOUS) ×2 IMPLANT
COVER WAND RF STERILE (DRAPES) ×2 IMPLANT
CUP ACET PINNACLE SECTR 48MM (Joint) ×1 IMPLANT
DRAPE C-ARM 42X72 X-RAY (DRAPES) ×2 IMPLANT
DRAPE STERI IOBAN 125X83 (DRAPES) ×2 IMPLANT
DRAPE U-SHAPE 47X51 STRL (DRAPES) ×6 IMPLANT
DRSG AQUACEL AG ADV 3.5X10 (GAUZE/BANDAGES/DRESSINGS) ×2 IMPLANT
DURAPREP 26ML APPLICATOR (WOUND CARE) ×2 IMPLANT
ELECT BLADE 4.0 EZ CLEAN MEGAD (MISCELLANEOUS) ×2
ELECT REM PT RETURN 9FT ADLT (ELECTROSURGICAL) ×2
ELECTRODE BLDE 4.0 EZ CLN MEGD (MISCELLANEOUS) ×1 IMPLANT
ELECTRODE REM PT RTRN 9FT ADLT (ELECTROSURGICAL) ×1 IMPLANT
EXTRACTOR BROKEN SCREW 7 (INSTRUMENTS) ×2 IMPLANT
GLOVE BIOGEL PI IND STRL 8 (GLOVE) ×1 IMPLANT
GLOVE BIOGEL PI INDICATOR 8 (GLOVE) ×1
GLOVE SURG ORTHO 8.0 STRL STRW (GLOVE) ×4 IMPLANT
GOWN STRL REUS W/ TWL LRG LVL3 (GOWN DISPOSABLE) ×3 IMPLANT
GOWN STRL REUS W/TWL LRG LVL3 (GOWN DISPOSABLE) ×3
HANDPIECE INTERPULSE COAX TIP (DISPOSABLE) ×1
HEAD FEMORAL 32 CERAMIC (Hips) ×2 IMPLANT
HOOD PEEL AWAY FLYTE STAYCOOL (MISCELLANEOUS) ×6 IMPLANT
KIT BASIN OR (CUSTOM PROCEDURE TRAY) ×2 IMPLANT
KIT TURNOVER KIT B (KITS) ×2 IMPLANT
NEEDLE HYPO 22GX1.5 SAFETY (NEEDLE) ×2 IMPLANT
NS IRRIG 1000ML POUR BTL (IV SOLUTION) ×2 IMPLANT
PACK TOTAL JOINT (CUSTOM PROCEDURE TRAY) ×2 IMPLANT
PAD ARMBOARD 7.5X6 YLW CONV (MISCELLANEOUS) ×2 IMPLANT
PINN ALTRX NEUT ID X OD 32X48 ×2 IMPLANT
PINNSECTOR W/GRIP ACE CUP 48MM (Joint) ×2 IMPLANT
ROD REAMER 2.5X1150 FEMORAL (INSTRUMENTS) ×2 IMPLANT
RTRCTR WOUND ALEXIS 18CM MED (MISCELLANEOUS) ×2
SCREW 6.5MMX25MM (Screw) ×2 IMPLANT
SET HNDPC FAN SPRY TIP SCT (DISPOSABLE) ×1 IMPLANT
STEM CORAIL KA08 (Stem) ×2 IMPLANT
STRIP CLOSURE SKIN 1/2X4 (GAUZE/BANDAGES/DRESSINGS) ×2 IMPLANT
SUCTION FRAZIER HANDLE 10FR (MISCELLANEOUS)
SUCTION FRAZIER TIP 10 FR DISP (SUCTIONS) ×2 IMPLANT
SUCTION TUBE FRAZIER 10FR DISP (MISCELLANEOUS) IMPLANT
SUT ETHIBOND NAB CT1 #1 30IN (SUTURE) ×4 IMPLANT
SUT MNCRL AB 3-0 PS2 18 (SUTURE) ×2 IMPLANT
SUT VIC AB 0 CT1 27 (SUTURE) ×3
SUT VIC AB 0 CT1 27XBRD ANBCTR (SUTURE) ×3 IMPLANT
SUT VIC AB 1 CT1 27 (SUTURE) ×3
SUT VIC AB 1 CT1 27XBRD ANBCTR (SUTURE) ×3 IMPLANT
SUT VIC AB 2-0 CT1 27 (SUTURE) ×2
SUT VIC AB 2-0 CT1 TAPERPNT 27 (SUTURE) ×2 IMPLANT
SYR 30ML LL (SYRINGE) ×2 IMPLANT
TOWEL OR 17X24 6PK STRL BLUE (TOWEL DISPOSABLE) ×2 IMPLANT
TOWEL OR 17X26 10 PK STRL BLUE (TOWEL DISPOSABLE) ×2 IMPLANT
TRAY FOLEY MTR SLVR 16FR STAT (SET/KITS/TRAYS/PACK) ×2 IMPLANT
WATER STERILE IRR 1000ML POUR (IV SOLUTION) ×2 IMPLANT

## 2018-06-11 NOTE — Brief Op Note (Signed)
06/11/2018  12:09 PM  PATIENT:  [name]  58 y.o. female  PRE-OPERATIVE DIAGNOSIS:  left hip osteoarthritis  POST-OPERATIVE DIAGNOSIS:  left hip osteoarthritis  PROCEDURE:  Procedure(s): LEFT TOTAL HIP ARTHROPLASTY ANTERIOR APPROACH  SURGEON:  Surgeon(s): Meredith Pel, MD  ASSISTANT: Purvis Kilts rnfa  ANESTHESIA:   spinal  EBL: 500 ml    Total I/O In: 1400 [I.V.:1400] Out: 1200 [Urine:450; Blood:750]  BLOOD ADMINISTERED: none  DRAINS: none   LOCAL MEDICATIONS USED:  none  SPECIMEN:  No Specimen  COUNTS:  YES  TOURNIQUET:  * No tourniquets in log *  DICTATION: .Other Dictation: Dictation Number [id number]  PLAN OF CARE: Admit to inpatient   PATIENT DISPOSITION:  PACU - hemodynamically stable

## 2018-06-11 NOTE — Anesthesia Postprocedure Evaluation (Signed)
Anesthesia Post Note  Patient: [name]  Procedure(s) Performed: LEFT TOTAL HIP ARTHROPLASTY ANTERIOR APPROACH (Left )     Patient location during evaluation: PACU Anesthesia Type: Spinal Level of consciousness: oriented and awake and alert Pain management: pain level controlled Vital Signs Assessment: post-procedure vital signs reviewed and stable Respiratory status: spontaneous breathing, respiratory function stable and patient connected to nasal cannula oxygen Cardiovascular status: blood pressure returned to baseline and stable Postop Assessment: no headache, no backache and no apparent nausea or vomiting Anesthetic complications: no    Last Vitals:  Vitals:   06/11/18 1607 06/11/18 1957  BP: (!) 141/75 137/71  Pulse: 72 75  Resp: 17 14  Temp: 36.7 C (!) 36.4 C  SpO2: 98% 93%    Last Pain:  Vitals:   06/11/18 1957  TempSrc: Oral  PainSc:                  Marcele Kosta L Asiah Browder

## 2018-06-11 NOTE — Op Note (Signed)
NAME: [name], [name] MEDICAL RECORD [id number] ACCOUNT [id number] DATE OF BIRTH:Sep 13, 1960 FACILITY: [facility] LOCATION: [facility] PHYSICIAN:GREGORY Randel Pigg, MD  OPERATIVE REPORT  DATE OF PROCEDURE:  06/11/2018  PREOPERATIVE DIAGNOSIS:  Left hip arthritis.  POSTOPERATIVE DIAGNOSIS:  Left hip arthritis.  PROCEDURE:  Left hip replacement using DePuy Corail stem size 8 ceramic head 32 mm with 48 mm cup and +4 polyethylene liner.  SURGEON:  Meredith Pel, MD  ASSISTANT:  Laure Kidney, RNFA  INDICATIONS:  The patient is a 58 year old patient with end-stage left hip arthritis who presents for operative management after explanation of risks and benefits.  PROCEDURE IN DETAIL:  The patient was brought to the operating room where spinal anesthetic was induced.  Preoperative antibiotics administered.  Timeout was called.  The patient was placed on the Hana bed with both legs under no traction.  Right  peroneal nerve was well padded.  The pannus was taped out of the way of the left hip.  This area was then prescrubbed with alcohol and Betadine, allowed to air dry, then prepped and DuraPrep solution and draped in a sterile manner.  Charlie Pitter was then used  to cover the operative field.  Timeout was called.  Incision was made 2 cm inferior and distal to the anterior superior iliac crest.  Skin and subcutaneous tissue were sharply divided.  The tensor fascia lata was encountered and divided.  Plane was then  developed between the rectus and the fascia lata.  Crossing circumflex vessels were coagulated.  Retractors were then placed on the anterior and posterior aspect of the femoral neck.  Capsule was T incised and tagged with #1 Vicryl sutures.  Retractor  was then placed on the femoral neck itself.  At this time with fluoroscopic guidance, the neck cut was made and then revised about 5 mm more distal.  This gave a good neck cut.  Bone was exceedingly hard.  Head was removed.  At this time, the  acetabular  retractors were placed and the labrum was excised.  Pulvinar also excised.  A good look at the cup was achieved.  Reaming was then performed up to a size 47.  The rim was touched with a 48 reamer.  The cup was then tapped into position in about 45  degrees of abduction and 15 degrees of anteversion.  A good fixation was achieved.  One screw was utilized, which obtained very good purchase.  The +4 liner was placed and seated.  Attention then directed towards the femur.  The leg was externally  rotated to about 100 degrees.  Trochanteric and inferior femoral neck retractors were placed.  Conjoined tendon was then released and this allowed for more external rotation.  At this time, the leg was externally rotated about 125 degrees.  The bone was  exceedingly hard in the proximal aspect of the femur.  Using a rongeur and a box cutter, lateralization was performed.  We actually could not get the chili pepper initiating broach in the canal.  Attempt was made with a drill bit to actually use that as  a canal finder, but that broke inside the femur and this was removed with a [place] set osteotome.  This was done with fluoroscopic guidance.  Then, proximal reaming was performed up to 10 mm.  This allowed the chili pepper broach to be seated along  with only the size 8 Corail broach.  With that in position parallel to the posterior cortex the head was reduced.  Leg lengths approximately  equal.  The patient had good stability to internal and external rotation as well as 60 degrees of external  rotation and 60 degrees of extension.  Trial components were removed and true components placed.  Calcar was co-planed.  A good press-fit was achieved on the femur.  The ceramic ball was placed in the same stability parameters were maintained.  At this  time, thorough irrigation was performed and the capsule was closed using #1 Vicryl suture.  Vancomycin powder placed both in the joint as well as above the capsular  closure and above the fascia lata closure.  Thorough irrigation was performed.   Vancomycin powder placed.  Capsule was closed followed by closure of the fascia lata using #1 Vicryl suture followed by inverted interrupted 0 Vicryl suture, 2-0 Vicryl suture and a 3-0 Monocryl.  Aquacel dressing placed.  Leg lengths approximately equal  at the conclusion of the case.  The patient tolerated the procedure well without immediate complications.  She was transferred to the recovery room in stable condition.  TN/NUANCE  D:06/11/2018 T:06/11/2018 JOB:[id number]

## 2018-06-11 NOTE — Telephone Encounter (Signed)
Patient having surgery today

## 2018-06-11 NOTE — Progress Notes (Signed)
Physical Therapy Evaluation Patient Details Name: [name] MRN: [id number] DOB: [birth year] Today's Date: 06/11/2018   History of Present Illness  Patient is 58 y/o female s/p L THA. PMH includes DM, HTN, HLD, OSA on CPAP, RA, dCHF, hx of cancer, hx of TIA, hx of L TKA, hx of PE, chest pain, anxiety, and asthma.   Clinical Impression  Patient admitted to hospital secondary to problems above and with deficits below. Patient required min guard to minA to perform transfers with RW. Mobility limited secondary to pain. Reviewed and educated patient and family about supine HEP. Patient will benefit from acute physical therapy services to maximize independence and safety with functional mobility.     Follow Up Recommendations Follow surgeon's recommendation for DC plan and follow-up therapies    Equipment Recommendations  Other (comment)(TBD at next venue)    Recommendations for Other Services       Precautions / Restrictions Precautions Precautions: Fall Restrictions Weight Bearing Restrictions: Yes LLE Weight Bearing: Weight bearing as tolerated      Mobility  Bed Mobility Overal bed mobility: Needs Assistance Bed Mobility: Supine to Sit     Supine to sit: Min assist     General bed mobility comments: Patient required minA for LLE assistance to sit EOB  Transfers Overall transfer level: Needs assistance Equipment used: Rolling walker (2 wheeled) Transfers: Sit to/from [facility] Sit to Stand: Min assist Stand pivot transfers: Min guard       General transfer comment: Patient required minA to stand for lift assist with RW. Verbal cues for hand placement when using RW prior to standing. Required min guard during stand pivot transfer for safety. Verbal cues for sequencing with RW. Mobility limited to chair secondary to pain.   Ambulation/Gait                Stairs            Wheelchair Mobility    Modified Rankin (Stroke Patients  Only)       Balance Overall balance assessment: Needs assistance Sitting-balance support: Feet supported;No upper extremity supported Sitting balance-Leahy Scale: Good     Standing balance support: Bilateral upper extremity supported Standing balance-Leahy Scale: Poor Standing balance comment: reliant on BUE support and use of RW to maintain standing balance                             Pertinent Vitals/Pain Pain Assessment: 0-10 Pain Score: 6  Pain Location: L hip Pain Descriptors / Indicators: Aching;Operative site guarding;Guarding;Grimacing Pain Intervention(s): Limited activity within patient's tolerance;Monitored during session;Premedicated before session;Repositioned;Ice applied    Home Living Family/patient expects to be discharged to:: Skilled nursing facility                      Prior Function Level of Independence: Independent with assistive device(s)         Comments: uses a cane at baseline     Hand Dominance        Extremity/Trunk Assessment   Upper Extremity Assessment Upper Extremity Assessment: Overall WFL for tasks assessed    Lower Extremity Assessment Lower Extremity Assessment: LLE deficits/detail LLE Deficits / Details: LLE deficits consistent with post op pain and weakness    Cervical / Trunk Assessment Cervical / Trunk Assessment: Normal  Communication   Communication: No difficulties  Cognition Arousal/Alertness: Awake/alert Behavior During Therapy: WFL for tasks assessed/performed Overall Cognitive Status: Within Functional  Limits for tasks assessed                                        General Comments General comments (skin integrity, edema, etc.): Patient daughter in room during session    Exercises Total Joint Exercises Ankle Circles/Pumps: AROM;20 reps;Supine;Both Quad Sets: AROM;Left;10 reps;Supine Heel Slides: AROM;Left;10 reps;Supine   Assessment/Plan    PT Assessment Patient  needs continued PT services  PT Problem List Decreased strength;Decreased range of motion;Decreased activity tolerance;Decreased balance;Decreased mobility;Decreased knowledge of use of DME;Decreased knowledge of precautions;Pain       PT Treatment Interventions DME instruction;Gait training;Functional mobility training;Therapeutic activities;Therapeutic exercise;Balance training;Patient/family education    PT Goals (Current goals can be found in the Care Plan section)  Acute Rehab PT Goals Patient Stated Goal: have less pain PT Goal Formulation: With patient Time For Goal Achievement: 06/25/18 Potential to Achieve Goals: Good    Frequency 7X/week   Barriers to discharge        Co-evaluation               AM-PAC PT "6 Clicks" Mobility  Outcome Measure Help needed turning from your back to your side while in a flat bed without using bedrails?: None Help needed moving from lying on your back to sitting on the side of a flat bed without using bedrails?: A Little Help needed moving to and from a bed to a chair (including a wheelchair)?: A Little Help needed standing up from a chair using your arms (e.g., wheelchair or bedside chair)?: A Little Help needed to walk in hospital room?: A Little Help needed climbing 3-5 steps with a railing? : A Lot 6 Click Score: 18    End of Session Equipment Utilized During Treatment: Gait belt Activity Tolerance: Patient limited by pain Patient left: in chair;with call bell/phone within reach;with family/visitor present Nurse Communication: Mobility status PT Visit Diagnosis: Muscle weakness (generalized) (M62.81);Difficulty in walking, not elsewhere classified (R26.2);Other abnormalities of gait and mobility (R26.89);Pain Pain - Right/Left: Left Pain - part of body: Hip    Time: 5929-2446 PT Time Calculation (min) (ACUTE ONLY): 32 min   Charges:   PT Evaluation $PT Eval Low Complexity: 1 Low PT Treatments $Therapeutic Activity: 8-22  mins        Erick Blinks, SPT  Erick Blinks 06/11/2018, 4:52 PM

## 2018-06-11 NOTE — Progress Notes (Signed)
Orthopedic Tech Progress Note Patient Details:  [name] 05-04-1960 [id number]  Ortho Devices Ortho Device/Splint Location: Trapeze bar Ortho Device/Splint Interventions: Application   Post Interventions Patient Tolerated: Well Instructions Provided: Care of device   [place] Pink 06/11/2018, 6:24 PM

## 2018-06-11 NOTE — Transfer of Care (Signed)
Immediate Anesthesia Transfer of Care Note  Patient: [name]  Procedure(s) Performed: LEFT TOTAL HIP ARTHROPLASTY ANTERIOR APPROACH (Left )  Patient Location: PACU  Anesthesia Type:Spinal  Level of Consciousness: awake and patient cooperative  Airway & Oxygen Therapy: Patient Spontanous Breathing and Patient connected to nasal cannula oxygen  Post-op Assessment: Report given to RN, Post -op Vital signs reviewed and stable and Patient moving all extremities  Post vital signs: Reviewed and stable  Last Vitals:  Vitals Value Taken Time  BP 111/72 06/11/2018 12:04 PM  Temp    Pulse 83 06/11/2018 12:04 PM  Resp 21 06/11/2018 12:04 PM  SpO2 97 % 06/11/2018 12:04 PM  Vitals shown include unvalidated device data.  Last Pain: There were no vitals filed for this visit.       Complications: No apparent anesthesia complications

## 2018-06-11 NOTE — H&P (Signed)
TOTAL HIP ADMISSION H&P  Patient is admitted for left total hip arthroplasty.  Subjective:  Chief Complaint: left hip pain  HPI: [name], 58 y.o. female, has a history of pain and functional disability in the left hip(s) due to arthritis and patient has failed non-surgical conservative treatments for greater than 12 weeks to include NSAID's and/or analgesics, flexibility and strengthening excercises, use of assistive devices and activity modification.  Onset of symptoms was gradual starting 8 years ago with gradually worsening course since that time.The patient noted no past surgery on the left hip(s).  Patient currently rates pain in the left hip at 10 out of 10 with activity. Patient has night pain, worsening of pain with activity and weight bearing, trendelenberg gait, pain that interfers with activities of daily living and pain with passive range of motion. Patient has evidence of subchondral sclerosis and joint space narrowing by imaging studies. This condition presents safety issues increasing the risk of falls. This patient has had Failure of conservative treatment with worsening of pain this year..  There is no current active infection.  Patient Active Problem List   Diagnosis Date Noted  . Class 3 severe obesity with serious comorbidity and body mass index (BMI) of 40.0 to 44.9 in adult ([place]) 05/29/2018  . Other fatigue 11/20/2017  . Shortness of breath on exertion 11/20/2017  . Type 2 diabetes mellitus without complication, without long-term current use of insulin ([place]) 11/20/2017  . Vitamin D deficiency 11/20/2017  . Depression 11/20/2017  . Other hyperlipidemia 11/20/2017  . 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 Presence of left artificial knee joint 09/20/2016  . Arthritis of knee 08/22/2016  . Cervical radiculopathy 07/26/2016  . Cervical disc disorder with radiculopathy 07/26/2016  . Right arm pain 06/29/2016  . Cervicalgia 06/29/2016  . Primary osteoarthritis  of left knee 06/29/2016  . Hypersomnia with sleep apnea 11/18/2015  . Lethargy 11/18/2015  . Chest pain 09/30/2014  . Abnormal cardiac function test 09/29/2014  . Chest pain with moderate risk for cardiac etiology 09/28/2014  . HTN (hypertension) 09/28/2014  . Hypokalemia   . Paresthesia 06/18/2014  . Numbness and tingling of left arm and leg   . Unstable angina ([place]) 05/24/2014  . OSA on CPAP 05/24/2014  . Diabetes mellitus type 2 in obese ([place]) 10/29/2012  . S/P laparoscopic appendectomy 06/04/2012  . Diastolic CHF ([place]) 13/11/6576  . Mediastinal mass 01/09/2012  . SOB (shortness of breath) 06/20/2011  . Mediastinal abnormality 06/20/2011  . Dyslipidemia 12/23/2009  . GLAUCOMA 12/23/2009  . ARTHRITIS 12/23/2009  . 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 Morbid obesity-BMI 45 09/13/2006  . ANXIETY STATE NOS 09/13/2006  . DISORDER, DEPRESSIVE NEC 09/13/2006  . CARPAL TUNNEL SYNDROME, MILD 09/13/2006  . Unspecified essential hypertension 09/13/2006  . IBS 09/13/2006  . DEGENERATION, LUMBAR/LUMBOSACRAL DISC 09/13/2006  . SYMPTOM, SWELLING/MASS/LUMP IN CHEST 09/13/2006  . PULMONARY EMBOLISM, HX OF 09/13/2006   Past Medical History:  Diagnosis Date  . Anginal pain ([place])    a. NL cath in 2008;  b. Myoview 03/2011: dec uptake along mid anterior wall on stress imaging -> ? attenuation vs. ischemia, EF 65%;  c. Echo 04/2011: EF 55-60%, no RWMA, Gr 2 dd  . Anxiety   . Arthritis   . Asthma   . Back pain   . Bone cancer ([place])   . Cancer ([place])   . Chest pain   . CHF (congestive heart failure) ([place])   . CHF (congestive heart failure) ([place])   .  Depression   . Diabetes mellitus   . Drug use   . Dyspnea    with exertion  . Dyspnea   . Frequent urination   . GERD (gastroesophageal reflux disease)   . Glaucoma   . HLD (hyperlipidemia)   . Hypertension   . IBS (irritable bowel syndrome)   . Joint pain   . Lactose intolerance   . Leg edema   . 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 Obesity   . Palpitations   . Pneumonia 05/2016   double  . Pulmonary edema   . Pulmonary embolism ([place])    a. 2008 -> coumadin x 6 mos.  . Rheumatoid arthritis ([place])   . Sleep apnea    on CPAP 02/2018  . TIA (transient ischemic attack)   . Urinary urgency     Past Surgical History:  Procedure Laterality Date  . ABDOMINAL HYSTERECTOMY  2005  . APPENDECTOMY    . CARDIAC CATHETERIZATION     Normal  . CARDIAC CATHETERIZATION N/A 09/30/2014   Procedure: Left Heart Cath and Coronary Angiography;  Surgeon: Sherren Mocha, MD;  Location: [facility];  Service: Cardiovascular;  Laterality: N/A;  . LAPAROSCOPIC APPENDECTOMY N/A 06/03/2012   Procedure: APPENDECTOMY LAPAROSCOPIC;  Surgeon: Stark Klein, MD;  Location: [facility];  Service: General;  Laterality: N/A;  . Left knee surgery  2008  . LEG SURGERY    . TONSILLECTOMY    . TOTAL KNEE ARTHROPLASTY Left 08/22/2016   Procedure: TOTAL KNEE ARTHROPLASTY;  Surgeon: Meredith Pel, MD;  Location: [facility];  Service: Orthopedics;  Laterality: Left;  . TUBAL LIGATION  1989    Current Facility-Administered Medications  Medication Dose Route Frequency Provider Last Rate Last Dose  . chlorhexidine (HIBICLENS) 4 % liquid 4 application  60 mL Topical Once Meredith Pel, MD      . chlorhexidine (HIBICLENS) 4 % liquid 4 application  60 mL Topical Once Meredith Pel, MD      . lactated ringers infusion   Intravenous Continuous Freddrick March, MD 10 mL/hr at 06/11/18 0641    . vancomycin (VANCOCIN) IVPB 1000 mg/200 mL premix  1,000 mg Intravenous On Call to OR Meredith Pel, MD 200 mL/hr at 06/11/18 0642 1,000 mg at 06/11/18 4431   Allergies  Allergen Reactions  . Penicillins Swelling and Other (See Comments)    Has patient had a PCN reaction causing immediate rash, facial/tongue/throat swelling, SOB or lightheadedness with hypotension: YES Has patient had a PCN reaction causing severe rash involving mucus  membranes or skin necrosis: NO Has patient had a PCN reaction that required hospitalization NO Has patient had a PCN reaction occurring within the last 10 years: NO If all of the above answers are "NO", then may proceed with Cephalosporin use.  [name] Hydrocodone-Acetaminophen Itching    confusion  . Latex Rash  . Sulfa Antibiotics Diarrhea, Itching and Rash    Social History   Tobacco Use  . Smoking status: Former Smoker    Packs/day: 0.50    Years: 15.00    Pack years: 7.50    Types: Cigarettes    Last attempt to quit: 04/24/1985    Years since quitting: 33.1  . Smokeless tobacco: Never Used  Substance Use Topics  . Alcohol use: No    Alcohol/week: 0.0 standard drinks    Family History  Problem Relation Age of Onset  . Emphysema Mother   .  Arthritis Mother   . Heart failure Mother        alive @ 90  . Stroke Mother   . Diabetes Mother   . Hypertension Mother   . Hyperlipidemia Mother   . Depression Mother   . Anxiety disorder Mother   . Asthma Brother   . Heart disease Father        died @ 21's.  . Stroke Father   . Diabetes Father   . Hyperlipidemia Father   . Hypertension Father   . Bipolar disorder Father   . Sleep apnea Father   . Alcoholism Father   . Drug abuse Father   . Diabetes Sister   . Heart disease Paternal Grandfather   . Colon cancer Maternal Grandfather   . Breast cancer Maternal Aunt      Review of Systems  Musculoskeletal: Positive for joint pain.  All other systems reviewed and are negative.   Objective:  Physical Exam  Constitutional: She appears well-developed.  HENT:  Head: Normocephalic.  Eyes: Pupils are equal, round, and reactive to light.  Neck: Normal range of motion.  Cardiovascular: Normal rate.  Respiratory: Effort normal.  Neurological: She is alert.  Skin: Skin is warm.  Psychiatric: She has a normal mood and affect.  Left hip examination demonstrates significantly decreased range of motion with internal and external  rotation.  Right hip range of motion is nontender.  Leg lengths approximately equal.  Pedal pulses intact.  Hip flexion strength is present but diminished due to pain.  Vital signs in last 24 hours: Temp:  [98.2 F (36.8 C)] 98.2 F (36.8 C) (02/18 0606) Pulse Rate:  [77] 77 (02/18 0606) Resp:  [18] 18 (02/18 0606) BP: (140)/(67) 140/67 (02/18 0606) SpO2:  [97 %] 97 % (02/18 0606) Weight:  [91.6 kg] 91.6 kg (02/18 0606)  Labs:   Estimated body mass index is 40.8 kg/m as calculated from the following:   Height as of this encounter: 4\' 11"  (1.499 m).   Weight as of this encounter: 91.6 kg.   Imaging Review Plain radiographs demonstrate severe degenerative joint disease of the left hip(s). The bone quality appears to be good for age and reported activity level.      Assessment/Plan:  End stage arthritis, left hip(s)  The patient history, physical examination, clinical judgement of the provider and imaging studies are consistent with end stage degenerative joint disease of the left hip(s) and total hip arthroplasty is deemed medically necessary. The treatment options including medical management, injection therapy, arthroscopy and arthroplasty were discussed at length. The risks and benefits of total hip arthroplasty were presented and reviewed. The risks due to aseptic loosening, infection, stiffness, dislocation/subluxation,  thromboembolic complications and other imponderables were discussed.  The patient acknowledged the explanation, agreed to proceed with the plan and consent was signed. Patient is being admitted for inpatient treatment for surgery, pain control, PT, OT, prophylactic antibiotics, VTE prophylaxis, progressive ambulation and ADL's and discharge planning.The patient is planning to be discharged home with home health services patient also has BMI of 40 which increases the risk component of the surgery.  Patient's BMI will add to the complexity of the case and may also  hinder rehabilitation.   Anticipated LOS equal to or greater than 2 midnights due to - Age 38 and older with one or more of the following:  - Obesity  - Expected need for hospital services (PT, OT, Nursing) required for safe  discharge  - Anticipated need for postoperative  skilled nursing care or inpatient rehab  - Active co-morbidities: None OR   - Unanticipated findings during/Post Surgery: None  - Patient is a high risk of re-admission due to: None

## 2018-06-12 ENCOUNTER — Encounter (HOSPITAL_COMMUNITY): Payer: Self-pay | Admitting: Orthopedic Surgery

## 2018-06-12 ENCOUNTER — Other Ambulatory Visit: Payer: Self-pay

## 2018-06-12 LAB — GLUCOSE, CAPILLARY
Glucose-Capillary: 108 mg/dL — ABNORMAL HIGH (ref 70–99)
Glucose-Capillary: 130 mg/dL — ABNORMAL HIGH (ref 70–99)
Glucose-Capillary: 171 mg/dL — ABNORMAL HIGH (ref 70–99)
Glucose-Capillary: 97 mg/dL (ref 70–99)

## 2018-06-12 LAB — CBC WITH DIFFERENTIAL/PLATELET
Abs Immature Granulocytes: 0.02 10*3/uL (ref 0.00–0.07)
Basophils Absolute: 0 10*3/uL (ref 0.0–0.1)
Basophils Relative: 1 %
Eosinophils Absolute: 0.2 10*3/uL (ref 0.0–0.5)
Eosinophils Relative: 2 %
HCT: 35.2 % — ABNORMAL LOW (ref 36.0–46.0)
Hemoglobin: 11.1 g/dL — ABNORMAL LOW (ref 12.0–15.0)
Immature Granulocytes: 0 %
Lymphocytes Relative: 32 %
Lymphs Abs: 2.7 10*3/uL (ref 0.7–4.0)
MCH: 27.8 pg (ref 26.0–34.0)
MCHC: 31.5 g/dL (ref 30.0–36.0)
MCV: 88.2 fL (ref 80.0–100.0)
Monocytes Absolute: 0.8 10*3/uL (ref 0.1–1.0)
Monocytes Relative: 9 %
Neutro Abs: 4.8 10*3/uL (ref 1.7–7.7)
Neutrophils Relative %: 56 %
Platelets: 200 10*3/uL (ref 150–400)
RBC: 3.99 MIL/uL (ref 3.87–5.11)
RDW: 13.7 % (ref 11.5–15.5)
WBC: 8.5 10*3/uL (ref 4.0–10.5)
nRBC: 0 % (ref 0.0–0.2)

## 2018-06-12 MED ORDER — BUPIVACAINE IN DEXTROSE 0.75-8.25 % IT SOLN
INTRATHECAL | Status: DC | PRN
  Administered 2018-06-11: 1.8 mL via INTRATHECAL

## 2018-06-12 MED ORDER — ORAL CARE MOUTH RINSE
15.0000 mL | Freq: Two times a day (BID) | OROMUCOSAL | Status: DC
  Administered 2018-06-12 (×2): 15 mL via OROMUCOSAL

## 2018-06-12 MED ORDER — CHLORHEXIDINE GLUCONATE 0.12 % MT SOLN
15.0000 mL | Freq: Two times a day (BID) | OROMUCOSAL | Status: DC
  Administered 2018-06-12 (×2): 15 mL via OROMUCOSAL
  Filled 2018-06-12 (×2): qty 15

## 2018-06-12 NOTE — Progress Notes (Signed)
Physical Therapy Treatment Patient Details Name: [name] MRN: [id number] DOB: 15-Apr-1961 Today's Date: 06/12/2018    History of Present Illness Patient is 58 y/o female s/p L THA. PMH includes DM, HTN, HLD, OSA on CPAP, RA, dCHF, hx of cancer, hx of TIA, hx of L TKA, hx of PE, chest pain, anxiety, and asthma.     PT Comments    Patient seen for mobility progression. Pt is making progress toward PT goals and able to ambulate 70 ft with RW and min A. Continue to progress as tolerated.    Follow Up Recommendations  Follow surgeon's recommendation for DC plan and follow-up therapies     Equipment Recommendations  Other (comment)(TBD at next venue)    Recommendations for Other Services       Precautions / Restrictions Precautions Precautions: Fall Restrictions Weight Bearing Restrictions: Yes LLE Weight Bearing: Weight bearing as tolerated    Mobility  Bed Mobility               General bed mobility comments: pt sitting on BSC with NT and RN present upon arrival  Transfers Overall transfer level: Needs assistance Equipment used: Rolling walker (2 wheeled) Transfers: Sit to/from [facility] Sit to Stand: Min guard         General transfer comment: cues for safe hand placement  Ambulation/Gait Ambulation/Gait assistance: Min assist Gait Distance (Feet): 70 Feet Assistive device: Rolling walker (2 wheeled) Gait Pattern/deviations: Step-to pattern;Step-through pattern;Decreased stance time - left;Decreased step length - right;Decreased step length - left;Decreased weight shift to left;Antalgic Gait velocity: decreased   General Gait Details: cues for safe proximity to RW, sequencing, and increased L step length   Stairs             Wheelchair Mobility    Modified Rankin (Stroke Patients Only)       Balance Overall balance assessment: Needs assistance Sitting-balance support: Feet supported;No upper extremity  supported Sitting balance-Leahy Scale: Good     Standing balance support: Single extremity supported Standing balance-Leahy Scale: Poor Standing balance comment: single UE support needed with static standing and bilat UE support for gait                             Cognition Arousal/Alertness: Awake/alert Behavior During Therapy: WFL for tasks assessed/performed Overall Cognitive Status: Within Functional Limits for tasks assessed                                        Exercises Total Joint Exercises Quad Sets: AROM;Left;10 reps Heel Slides: Left;10 reps;AAROM;Strengthening Hip ABduction/ADduction: AAROM;Strengthening;Left;10 reps    General Comments        Pertinent Vitals/Pain Pain Assessment: Faces Faces Pain Scale: Hurts little more Pain Location: L hip Pain Descriptors / Indicators: Guarding;Grimacing;Sore Pain Intervention(s): Limited activity within patient's tolerance;Monitored during session;Premedicated before session;Repositioned    Home Living                      Prior Function            PT Goals (current goals can now be found in the care plan section) Acute Rehab PT Goals Patient Stated Goal: be able to work part time Progress towards PT goals: Progressing toward goals    Frequency    7X/week      PT Plan  Current plan remains appropriate    Co-evaluation              AM-PAC PT "6 Clicks" Mobility   Outcome Measure  Help needed turning from your back to your side while in a flat bed without using bedrails?: None Help needed moving from lying on your back to sitting on the side of a flat bed without using bedrails?: A Little Help needed moving to and from a bed to a chair (including a wheelchair)?: A Little Help needed standing up from a chair using your arms (e.g., wheelchair or bedside chair)?: A Little Help needed to walk in hospital room?: A Little Help needed climbing 3-5 steps with a railing?  : A Little 6 Click Score: 19    End of Session Equipment Utilized During Treatment: Gait belt Activity Tolerance: Patient tolerated treatment well Patient left: in chair;with call bell/phone within reach Nurse Communication: Mobility status PT Visit Diagnosis: Muscle weakness (generalized) (M62.81);Difficulty in walking, not elsewhere classified (R26.2);Other abnormalities of gait and mobility (R26.89);Pain Pain - Right/Left: Left Pain - part of body: Hip     Time: 1125-1200 PT Time Calculation (min) (ACUTE ONLY): 35 min  Charges:  $Gait Training: 8-22 mins $Therapeutic Exercise: 8-22 mins                     Earney Navy, PTA Acute Rehabilitation Services Pager: [phone] Office: [phone]     Darliss Cheney 06/12/2018, 12:06 PM

## 2018-06-12 NOTE — Discharge Instructions (Signed)
Information on my medicine - XARELTO® (Rivaroxaban) ° °This medication education was reviewed with me or my healthcare representative as part of my discharge preparation. ° °Why was Xarelto® prescribed for you? °Xarelto® was prescribed for you to reduce the risk of blood clots forming after orthopedic surgery. The medical term for these abnormal blood clots is venous thromboembolism (VTE). ° °What do you need to know about xarelto® ? °Take your Xarelto® ONCE DAILY at the same time every day. °You may take it either with or without food. ° °If you have difficulty swallowing the tablet whole, you may crush it and mix in applesauce just prior to taking your dose. ° °Take Xarelto® exactly as prescribed by your doctor and DO NOT stop taking Xarelto® without talking to the doctor who prescribed the medication.  Stopping without other VTE prevention medication to take the place of Xarelto® may increase your risk of developing a clot. ° °After discharge, you should have regular check-up appointments with your healthcare provider that is prescribing your Xarelto®.   ° °What do you do if you miss a dose? °If you miss a dose, take it as soon as you remember on the same day then continue your regularly scheduled once daily regimen the next day. Do not take two doses of Xarelto® on the same day.  ° °Important Safety Information °A possible side effect of Xarelto® is bleeding. You should call your healthcare provider right away if you experience any of the following: °? Bleeding from an injury or your nose that does not stop. °? Unusual colored urine (red or dark brown) or unusual colored stools (red or black). °? Unusual bruising for unknown reasons. °? A serious fall or if you hit your head (even if there is no bleeding). ° °Some medicines may interact with Xarelto® and might increase your risk of bleeding while on Xarelto®. To help avoid this, consult your healthcare provider or pharmacist prior to using any new prescription  or non-prescription medications, including herbals, vitamins, non-steroidal anti-inflammatory drugs (NSAIDs) and supplements. ° °This website has more information on Xarelto®: www.xarelto.com. ° ° ° °

## 2018-06-12 NOTE — Anesthesia Procedure Notes (Signed)
Spinal  Patient location during procedure: OR Start time: 06/11/2018 7:42 AM End time: 06/11/2018 7:52 AM Staffing Anesthesiologist: Freddrick March, MD Performed: anesthesiologist  Preanesthetic Checklist Completed: patient identified, surgical consent, pre-op evaluation, timeout performed, IV checked, risks and benefits discussed and monitors and equipment checked Spinal Block Patient position: sitting Prep: site prepped and draped and DuraPrep Patient monitoring: cardiac monitor, continuous pulse ox and blood pressure Approach: right paramedian Location: L3-4 Injection technique: single-shot Needle Needle type: Pencan  Needle gauge: 24 G Needle length: 9 cm Assessment Sensory level: T6 Additional Notes Functioning IV was confirmed and monitors were applied. Sterile prep and drape, including hand hygiene and sterile gloves were used. The patient was positioned and the spine was prepped. The skin was anesthetized with lidocaine.  Free flow of clear CSF was obtained prior to injecting local anesthetic into the CSF.  The spinal needle aspirated freely following injection.  The needle was carefully withdrawn.  The patient tolerated the procedure well.

## 2018-06-12 NOTE — Progress Notes (Signed)
Subjective: Pt stable - pain ok   Objective: Vital signs in last 24 hours: Temp:  [97.5 F (36.4 C)-98.6 F (37 C)] 98.6 F (37 C) (02/19 0500) Pulse Rate:  [65-83] 80 (02/19 0500) Resp:  [14-21] 16 (02/19 0500) BP: (110-141)/(65-77) 114/66 (02/19 0500) SpO2:  [91 %-100 %] 92 % (02/19 0500)  Intake/Output from previous day: 02/18 0701 - 02/19 0700 In: 1980 [P.O.:480; I.V.:1500] Out: 2450 [Urine:1700; Blood:750] Intake/Output this shift: No intake/output data recorded.  Exam:  Sensation intact distally Intact pulses distally Dorsiflexion/Plantar flexion intact  Labs: Recent Labs    06/10/18 1435  HGB 12.8   Recent Labs    06/10/18 1435  WBC 5.6  RBC 4.59  HCT 40.5  PLT 226   Recent Labs    06/10/18 1435  NA 142  K 3.9  CL 105  CO2 29  BUN 14  CREATININE 0.85  GLUCOSE 86  CALCIUM 9.8   No results for input(s): LABPT, INR in the last 72 hours.  Assessment/Plan: Plan check cbc today snf tomorrow   Anderson Malta 06/12/2018, 8:22 AM

## 2018-06-13 LAB — GLUCOSE, CAPILLARY
Glucose-Capillary: 114 mg/dL — ABNORMAL HIGH (ref 70–99)
Glucose-Capillary: 115 mg/dL — ABNORMAL HIGH (ref 70–99)
Glucose-Capillary: 147 mg/dL — ABNORMAL HIGH (ref 70–99)
Glucose-Capillary: 124 mg/dL — ABNORMAL HIGH (ref 70–99)

## 2018-06-13 MED ORDER — DIPHENHYDRAMINE HCL 25 MG PO CAPS
25.0000 mg | ORAL_CAPSULE | Freq: Four times a day (QID) | ORAL | Status: DC | PRN
  Administered 2018-06-13 – 2018-06-15 (×4): 25 mg via ORAL
  Filled 2018-06-13 (×4): qty 1

## 2018-06-13 NOTE — Progress Notes (Signed)
Subjective: Patient stable.  Has been able to walk in the room and to the desk today.   Objective: Vital signs in last 24 hours: Temp:  [99.4 F (37.4 C)-102 F (38.9 C)] 99.4 F (37.4 C) (02/20 1345) Pulse Rate:  [83-90] 86 (02/20 1345) Resp:  [16-20] 16 (02/20 1345) BP: (100-120)/(52-69) 100/52 (02/20 1345) SpO2:  [91 %-95 %] 95 % (02/20 1345)  Intake/Output from previous day: 02/19 0701 - 02/20 0700 In: 720 [P.O.:720] Out: 550 [Urine:550] Intake/Output this shift: Total I/O In: 480 [P.O.:480] Out: -   Exam:  Dorsiflexion/Plantar flexion intact  Labs: Recent Labs    06/12/18 1321  HGB 11.1*   Recent Labs    06/12/18 1321  WBC 8.5  RBC 3.99  HCT 35.2*  PLT 200   No results for input(s): NA, K, CL, CO2, BUN, CREATININE, GLUCOSE, CALCIUM in the last 72 hours. No results for input(s): LABPT, INR in the last 72 hours.  Assessment/Plan: Plan at this time is discharged to skilled nursing.  She is medically ready for that tomorrow.  She will follow-up with me in 10 days.   Landry Dyke Yechezkel Fertig 06/13/2018, 5:52 PM

## 2018-06-13 NOTE — Progress Notes (Signed)
Chaplain responded to spiritual care consult. Patient requests prayer.  Patient unavailable.  Staff with them in room.   Will refer to on-call chaplain as unit chaplain is in educational unit all day today. Tamsen Snider Pager [phone]

## 2018-06-13 NOTE — Progress Notes (Signed)
Pt refusing cpap for the night. RT will continue to monitor as needed. 

## 2018-06-13 NOTE — Progress Notes (Signed)
Physical Therapy Treatment Patient Details Name: [name] MRN: [id number] DOB: March 13, 1961 Today's Date: 06/13/2018    History of Present Illness Patient is 58 y/o female s/p L THA. PMH includes DM, HTN, HLD, OSA on CPAP, RA, dCHF, hx of cancer, hx of TIA, hx of L TKA, hx of PE, chest pain, anxiety, and asthma.     PT Comments    Patient seen for mobility progression. Pt continues to make gradual progress toward PT goals and tolerated gait training and therex well. Current plan remains appropriate.    Follow Up Recommendations  Follow surgeon's recommendation for DC plan and follow-up therapies     Equipment Recommendations  Other (comment)(TBD at next venue)    Recommendations for Other Services       Precautions / Restrictions Precautions Precautions: Fall Restrictions Weight Bearing Restrictions: Yes LLE Weight Bearing: Weight bearing as tolerated    Mobility  Bed Mobility Overal bed mobility: Needs Assistance Bed Mobility: Supine to Sit;Sit to Supine     Supine to sit: Min guard Sit to supine: Min assist   General bed mobility comments: assist required to bring L LE into bed; cues for technique; use of rails to get into sitting  Transfers Overall transfer level: Needs assistance Equipment used: Rolling walker (2 wheeled) Transfers: Sit to/from Stand Sit to Stand: Min guard         General transfer comment: cues for safe hand placement  Ambulation/Gait Ambulation/Gait assistance: Min assist;Min guard Gait Distance (Feet): 90 Feet Assistive device: Rolling walker (2 wheeled) Gait Pattern/deviations: Step-to pattern;Step-through pattern;Decreased stance time - left;Decreased step length - right;Decreased step length - left;Decreased weight shift to left;Antalgic Gait velocity: decreased   General Gait Details: cues for posture and step length symmetry   Stairs             Wheelchair Mobility    Modified Rankin (Stroke Patients Only)       Balance Overall balance assessment: Needs assistance Sitting-balance support: Feet supported;No upper extremity supported Sitting balance-Leahy Scale: Good     Standing balance support: Single extremity supported Standing balance-Leahy Scale: Poor Standing balance comment: single UE support needed with static standing and bilat UE support for gait                             Cognition Arousal/Alertness: Awake/alert(a little drowsy this session) Behavior During Therapy: WFL for tasks assessed/performed Overall Cognitive Status: Within Functional Limits for tasks assessed                                        Exercises Total Joint Exercises Ankle Circles/Pumps: AROM;Both Short Arc Quad: AROM;Strengthening;Left;10 reps Heel Slides: Left;10 reps;Strengthening;AROM Hip ABduction/ADduction: Strengthening;Left;10 reps;AROM    General Comments        Pertinent Vitals/Pain Pain Assessment: Faces Faces Pain Scale: Hurts little more Pain Location: L hip Pain Descriptors / Indicators: Guarding;Grimacing;Sore Pain Intervention(s): Limited activity within patient's tolerance;Monitored during session;Premedicated before session;Repositioned;Ice applied    Home Living                      Prior Function            PT Goals (current goals can now be found in the care plan section) Acute Rehab PT Goals Patient Stated Goal: be able to work part time Progress towards  PT goals: Progressing toward goals    Frequency    7X/week      PT Plan Current plan remains appropriate    Co-evaluation              AM-PAC PT "6 Clicks" Mobility   Outcome Measure  Help needed turning from your back to your side while in a flat bed without using bedrails?: None Help needed moving from lying on your back to sitting on the side of a flat bed without using bedrails?: A Little Help needed moving to and from a bed to a chair (including a  wheelchair)?: A Little Help needed standing up from a chair using your arms (e.g., wheelchair or bedside chair)?: A Little Help needed to walk in hospital room?: A Little Help needed climbing 3-5 steps with a railing? : A Little 6 Click Score: 19    End of Session Equipment Utilized During Treatment: Gait belt Activity Tolerance: Patient tolerated treatment well Patient left: with call bell/phone within reach;in bed Nurse Communication: Mobility status PT Visit Diagnosis: Muscle weakness (generalized) (M62.81);Difficulty in walking, not elsewhere classified (R26.2);Other abnormalities of gait and mobility (R26.89);Pain Pain - Right/Left: Left Pain - part of body: Hip     Time: 4734-0370 PT Time Calculation (min) (ACUTE ONLY): 32 min  Charges:  $Gait Training: 8-22 mins $Therapeutic Exercise: 8-22 mins                     Earney Navy, PTA Acute Rehabilitation Services Pager: [phone] Office: [phone]     Darliss Cheney 06/13/2018, 10:57 AM

## 2018-06-13 NOTE — Clinical Social Work Note (Signed)
Clinical Social Work Assessment  Patient Details  Name: [name] MRN: [id number] Date of Birth: 02-21-1961  Date of referral:  06/13/18               Reason for consult:  Discharge Planning                Permission sought to share information with:  Case Manager, Facility Sport and exercise psychologist, Family Supports Permission granted to share information::  Yes, Verbal Permission Granted  Name::     Retail banker::  SNFs  Relationship::  daughter  Contact Information:  [phone]  Housing/Transportation Living arrangements for the past 2 months:  [place] of Information:  Patient Patient Interpreter Needed:  None Criminal Activity/Legal Involvement Pertinent to Current Situation/Hospitalization:  No - Comment as needed Significant Relationships:  Adult Children Lives with:  Self Do you feel safe going back to the place where you live?  No Need for family participation in patient care:  No (Coment)  Care giving concerns:  CSW received referral for possible SNF placement at time of discharge. Spoke with patient regarding possibility of SNF placement . Patient's  family  is currently unable to care for her at their home given patient's current needs and fall risk.  Patient  expressed understanding of PT recommendation and are agreeable to SNF placement at time of discharge. CSW to continue to follow and assist with discharge planning needs.     Social Worker assessment / plan:  Spoke with patient concerning possibility of rehab at SNF before returning home.    Employment status:  Retired Nurse, adult PT Recommendations:  [place] / Referral to community resources:  [place]  Patient/Family's Response to care:  Patient recognizes need for rehab before returning home and are agreeable to a SNF in [place]. They report preference for  [facility] as she was here 2 years ago . CSW  explained insurance authorization process. Patient's family reported that they want patient to get stronger to be able to come back home.    Patient/Family's Understanding of and Emotional Response to Diagnosis, Current Treatment, and Prognosis:  Patient/family is realistic regarding therapy needs and expressed being hopeful for SNF placement. Patient expressed understanding of CSW role and discharge process as well as medical condition. No questions/concerns about plan or treatment.    Emotional Assessment Appearance:  Appears stated age Attitude/Demeanor/Rapport:  Gracious Affect (typically observed):  Accepting Orientation:  Oriented to Self, Oriented to Place, Oriented to  Time, Oriented to Situation Alcohol / Substance use:  Not Applicable Psych involvement (Current and /or in the community):  No (Comment)  Discharge Needs  Concerns to be addressed:  Discharge Planning Concerns Readmission within the last 30 days:  No Current discharge risk:  Dependent with Mobility Barriers to Discharge:  Continued Medical Work up   [employer], [place] 06/13/2018, 12:41 PM

## 2018-06-13 NOTE — NC FL2 (Signed)
[place] LEVEL OF CARE SCREENING TOOL     IDENTIFICATION  Patient Name: [name] Birthdate: January 26, 1961 Sex: female Admission Date (Current Location): 06/11/2018  [facility] [place] Number:  Herbalist and Address:  [facility], [place] [address], [place], [place] [address]      Provider Number: [id number]  Attending Physician Name and Address:  Meredith Pel, MD  Relative Name and Phone Number:  Charlena Cross (daughter) [phone]    Current Level of Care: Hospital Recommended Level of Care: [place] Prior Approval Number:    Date Approved/Denied: 08/23/16 PASRR Number: [id number]  Discharge Plan: SNF    Current Diagnoses: Patient Active Problem List   Diagnosis Date Noted  . Hip arthritis 06/11/2018  . Class 3 severe obesity with serious comorbidity and body mass index (BMI) of 40.0 to 44.9 in adult ([place]) 05/29/2018  . Other fatigue 11/20/2017  . Shortness of breath on exertion 11/20/2017  . Type 2 diabetes mellitus without complication, without long-term current use of insulin ([place]) 11/20/2017  . Vitamin D deficiency 11/20/2017  . Depression 11/20/2017  . Other hyperlipidemia 11/20/2017  . 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 Presence of left artificial knee joint 09/20/2016  . Arthritis of knee 08/22/2016  . Cervical radiculopathy 07/26/2016  . Cervical disc disorder with radiculopathy 07/26/2016  . Right arm pain 06/29/2016  . Cervicalgia 06/29/2016  . Primary osteoarthritis of left knee 06/29/2016  . Hypersomnia with sleep apnea 11/18/2015  . Lethargy 11/18/2015  . Chest pain 09/30/2014  . Abnormal cardiac function test 09/29/2014  . Chest pain with moderate risk for cardiac etiology 09/28/2014  . HTN (hypertension) 09/28/2014  . Hypokalemia   . Paresthesia 06/18/2014  . Numbness and tingling of left arm and leg   . Unstable angina ([place]) 05/24/2014  . OSA on CPAP 05/24/2014   . Diabetes mellitus type 2 in obese ([place]) 10/29/2012  . S/P laparoscopic appendectomy 06/04/2012  . Diastolic CHF ([place]) 74/94/4967  . Mediastinal mass 01/09/2012  . SOB (shortness of breath) 06/20/2011  . Mediastinal abnormality 06/20/2011  . Dyslipidemia 12/23/2009  . GLAUCOMA 12/23/2009  . ARTHRITIS 12/23/2009  . 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 Morbid obesity-BMI 45 09/13/2006  . ANXIETY STATE NOS 09/13/2006  . DISORDER, DEPRESSIVE NEC 09/13/2006  . CARPAL TUNNEL SYNDROME, MILD 09/13/2006  . Unspecified essential hypertension 09/13/2006  . IBS 09/13/2006  . DEGENERATION, LUMBAR/LUMBOSACRAL DISC 09/13/2006  . SYMPTOM, SWELLING/MASS/LUMP IN CHEST 09/13/2006  . PULMONARY EMBOLISM, HX OF 09/13/2006    Orientation RESPIRATION BLADDER Height & Weight     Self, Time, Situation, Place  Normal Continent Weight: 91.6 kg Height:  4\' 11"  (149.9 cm)  BEHAVIORAL SYMPTOMS/MOOD NEUROLOGICAL BOWEL NUTRITION STATUS      Continent Diet(see discharge summary)  AMBULATORY STATUS COMMUNICATION OF NEEDS Skin   Extensive Assist Verbally Surgical wounds(left hip and left knee closed surgical incision)                       Personal Care Assistance Level of Assistance  Bathing, Feeding, Dressing, Total care Bathing Assistance: Maximum assistance Feeding assistance: Independent Dressing Assistance: Maximum assistance Total Care Assistance: Maximum assistance   Functional Limitations Info  Sight, Hearing, Speech Sight Info: Adequate Hearing Info: Adequate Speech Info: Adequate    SPECIAL CARE FACTORS FREQUENCY  PT (By licensed PT), OT (By licensed OT)     PT Frequency: min 5x weekly OT Frequency: min 5x weekly  Contractures Contractures Info: Not present    Additional Factors Info  Code Status, Allergies Code Status Info: full Allergies Info: Penicillins, Hydrocodone-acetaminophen, latex, sulfa antibiotics           Current Medications (06/13/2018):  This is the  current hospital active medication list Current Facility-Administered Medications  Medication Dose Route Frequency Provider Last Rate Last Dose  . acetaminophen (TYLENOL) tablet 325-650 mg  325-650 mg Oral Q6H PRN Meredith Pel, MD      . albuterol (PROVENTIL) (2.5 MG/3ML) 0.083% nebulizer solution 3 mL  3 mL Inhalation Q6H PRN Meredith Pel, MD      . amLODipine (NORVASC) tablet 5 mg  5 mg Oral Daily Meredith Pel, MD   5 mg at 06/13/18 1039  . atorvastatin (LIPITOR) tablet 40 mg  40 mg Oral QHS Meredith Pel, MD   40 mg at 06/12/18 2140  . benzonatate (TESSALON) capsule 100 mg  100 mg Oral TID PRN Meredith Pel, MD      . carvedilol (COREG CR) 24 hr capsule 10 mg  10 mg Oral Daily Meredith Pel, MD   10 mg at 06/13/18 1039  . docusate sodium (COLACE) capsule 100 mg  100 mg Oral BID Meredith Pel, MD   100 mg at 06/13/18 1039  . furosemide (LASIX) tablet 40 mg  40 mg Oral Daily Meredith Pel, MD   40 mg at 06/13/18 1039  . gabapentin (NEURONTIN) capsule 600 mg  600 mg Oral TID Meredith Pel, MD   600 mg at 06/13/18 1039  . HYDROmorphone (DILAUDID) injection 0.5 mg  0.5 mg Intravenous Q4H PRN Meredith Pel, MD      . isosorbide dinitrate (ISORDIL) tablet 20 mg  20 mg Oral BID Meredith Pel, MD   20 mg at 06/13/18 1039  . linagliptin (TRADJENTA) tablet 5 mg  5 mg Oral Park Breed, MD   5 mg at 06/13/18 0815   And  . metFORMIN (GLUCOPHAGE-XR) 24 hr tablet 500 mg  500 mg Oral Cathlean Marse[place], Tonna Corner, MD   500 mg at 06/13/18 0815  . lisinopril (PRINIVIL,ZESTRIL) tablet 20 mg  20 mg Oral Daily Meredith Pel, MD   20 mg at 06/13/18 1039  . loperamide (IMODIUM) capsule 4 mg  4 mg Oral QID PRN Meredith Pel, MD      . MEDLINE mouth rinse  15 mL Mouth Rinse q12n4p Meredith Pel, MD   15 mL at 06/12/18 1734  . menthol-cetylpyridinium (CEPACOL) lozenge 3 mg  1 lozenge Oral PRN Meredith Pel, MD        Or  . phenol (CHLORASEPTIC) mouth spray 1 spray  1 spray Mouth/Throat PRN Meredith Pel, MD      . methocarbamol (ROBAXIN) tablet 500 mg  500 mg Oral Q6H PRN Meredith Pel, MD   500 mg at 06/12/18 1125   Or  . methocarbamol (ROBAXIN) 500 mg in dextrose 5 % 50 mL IVPB  500 mg Intravenous Q6H PRN Meredith Pel, MD      . metoCLOPramide (REGLAN) tablet 5-10 mg  5-10 mg Oral Q8H PRN Meredith Pel, MD       Or  . metoCLOPramide (REGLAN) injection 5-10 mg  5-10 mg Intravenous Q8H PRN Meredith Pel, MD      . mirabegron ER [facility]) tablet 25 mg  25 mg Oral Daily Meredith Pel, MD   25 mg at 06/13/18  1039  . ondansetron (ZOFRAN) tablet 4 mg  4 mg Oral Q6H PRN Meredith Pel, MD       Or  . ondansetron [facility]) injection 4 mg  4 mg Intravenous Q6H PRN Meredith Pel, MD      . oxyCODONE (Oxy IR/ROXICODONE) immediate release tablet 5-10 mg  5-10 mg Oral Q4H PRN Meredith Pel, MD   10 mg at 06/13/18 0815  . pantoprazole (PROTONIX) EC tablet 40 mg  40 mg Oral Daily Meredith Pel, MD   40 mg at 06/13/18 1039  . potassium chloride (K-DUR,KLOR-CON) CR tablet 10 mEq  10 mEq Oral BID Meredith Pel, MD   10 mEq at 06/13/18 1039  . rivaroxaban (XARELTO) tablet 10 mg  10 mg Oral Q breakfast Meredith Pel, MD   10 mg at 06/13/18 0816  . sertraline (ZOLOFT) tablet 100 mg  100 mg Oral Daily Meredith Pel, MD   100 mg at 06/13/18 1039  . traMADol (ULTRAM) tablet 50 mg  50 mg Oral Q6H Meredith Pel, MD   50 mg at 06/13/18 0538  . [START ON 06/17/2018] Vitamin D (Ergocalciferol) (DRISDOL) capsule 50,000 Units  50,000 Units Oral Q7 days Meredith Pel, MD         Discharge Medications: Please see discharge summary for a list of discharge medications.  Relevant Imaging Results:  Relevant Lab Results:   Additional Information SSN: [id number]  Alberteen Sam, LCSW

## 2018-06-14 ENCOUNTER — Inpatient Hospital Stay (INDEPENDENT_AMBULATORY_CARE_PROVIDER_SITE_OTHER): Payer: Medicare Other | Admitting: Orthopedic Surgery

## 2018-06-14 LAB — GLUCOSE, CAPILLARY
Glucose-Capillary: 119 mg/dL — ABNORMAL HIGH (ref 70–99)
Glucose-Capillary: 159 mg/dL — ABNORMAL HIGH (ref 70–99)
Glucose-Capillary: 103 mg/dL — ABNORMAL HIGH (ref 70–99)
Glucose-Capillary: 105 mg/dL — ABNORMAL HIGH (ref 70–99)

## 2018-06-14 MED ORDER — ACETAMINOPHEN 325 MG PO TABS: 325.0000 mg | ORAL_TABLET | Freq: Four times a day (QID) | ORAL | 0 refills | Status: DC | PRN

## 2018-06-14 MED ORDER — OXYCODONE HCL 5 MG PO TABS: 5.0000 mg | ORAL_TABLET | ORAL | 0 refills | Status: DC | PRN

## 2018-06-14 MED ORDER — RIVAROXABAN 10 MG PO TABS: 10.0000 mg | ORAL_TABLET | Freq: Every day | ORAL | 0 refills | Status: DC

## 2018-06-14 NOTE — Progress Notes (Signed)
Patient refuses CPAP and will call if anything changes

## 2018-06-14 NOTE — Progress Notes (Signed)
Subjective: Patient stable.  Pain controlled.  Has been ambulating in room   Objective: Vital signs in last 24 hours: Temp:  [99.1 F (37.3 C)-99.6 F (37.6 C)] 99.6 F (37.6 C) (02/21 0530) Pulse Rate:  [81-90] 81 (02/21 0530) Resp:  [16-20] 20 (02/21 0530) BP: (100-123)/(52-62) 101/55 (02/21 0530) SpO2:  [93 %-95 %] 94 % (02/21 0530)  Intake/Output from previous day: 02/20 0701 - 02/21 0700 In: 720 [P.O.:720] Out: -  Intake/Output this shift: No intake/output data recorded.  Exam:  Intact pulses distally Dorsiflexion/Plantar flexion intact  Labs: Recent Labs    06/12/18 1321  HGB 11.1*   Recent Labs    06/12/18 1321  WBC 8.5  RBC 3.99  HCT 35.2*  PLT 200   No results for input(s): NA, K, CL, CO2, BUN, CREATININE, GLUCOSE, CALCIUM in the last 72 hours. No results for input(s): LABPT, INR in the last 72 hours.  Assessment/Plan: Plan at this time is to discharge to skilled nursing once bed becomes available.  Prescriptions and discharge summary done at this time.  It appears that she may be more of a Monday type skilled nursing placement patient as opposed to today.  Really depends on bed availability.   G Scott Jacora Hopkins 06/14/2018, 9:05 AM

## 2018-06-14 NOTE — Discharge Summary (Signed)
Physician Discharge Summary  Patient ID: [name] MRN: [id number] DOB/AGE: 25-Dec-1960 58 y.o.  Admit date: 06/11/2018 Discharge date: 06/15/2018  Admission Diagnoses:  Active Problems:   Hip arthritis   Discharge Diagnoses:  Same  Surgeries: Procedure(s): LEFT TOTAL HIP ARTHROPLASTY ANTERIOR APPROACH on 06/11/2018   Consultants:   Discharged Condition: Stable  Hospital Course: [name] is an 58 y.o. female who was admitted 06/11/2018 with a chief complaint of left hip pain, and found to have a diagnosis of left hip arthritis.  They were brought to the operating room on 06/11/2018 and underwent the above named procedures.  Patient tolerated procedure well.  She was mobilized with physical therapy and ambulating with a walker by postop day #1.  Discharged to skilled nursing for continued rehab in good condition.  She is sent with oxycodone for pain and Xarelto for DVT prophylaxis.  She does have a history of pulmonary embolism.  She will follow-up with me in approximately 10 days  Antibiotics given:  Anti-infectives (From admission, onward)   Start     Dose/Rate Route Frequency Ordered Stop   06/11/18 1900  vancomycin (VANCOCIN) IVPB 1000 mg/200 mL premix     1,000 mg 200 mL/hr over 60 Minutes Intravenous Every 12 hours 06/11/18 1322 06/11/18 1932   06/11/18 1028  vancomycin (VANCOCIN) powder  Status:  Discontinued       As needed 06/11/18 1029 06/11/18 1159   06/11/18 0615  vancomycin (VANCOCIN) IVPB 1000 mg/200 mL premix     1,000 mg 200 mL/hr over 60 Minutes Intravenous On call to O.R. 06/11/18 3716 06/11/18 9678    .  Recent vital signs:  Vitals:   06/13/18 2105 06/14/18 0530  BP: 123/62 (!) 101/55  Pulse: 87 81  Resp: 18 20  Temp: 99.1 F (37.3 C) 99.6 F (37.6 C)  SpO2: 93% 94%    Recent laboratory studies:  Results for orders placed or performed during the hospital encounter of 06/11/18  Glucose, capillary  Result Value Ref Range   Glucose-Capillary 122 (H) 70 - 99 mg/dL   Comment 1 Notify RN   Glucose, capillary  Result Value Ref Range   Glucose-Capillary 118 (H) 70 - 99 mg/dL   Comment 1 Notify RN   Glucose, capillary  Result Value Ref Range   Glucose-Capillary 134 (H) 70 - 99 mg/dL  CBC with Differential/Platelet  Result Value Ref Range   WBC 8.5 4.0 - 10.5 K/uL   RBC 3.99 3.87 - 5.11 MIL/uL   Hemoglobin 11.1 (L) 12.0 - 15.0 g/dL   HCT 35.2 (L) 36.0 - 46.0 %   MCV 88.2 80.0 - 100.0 fL   MCH 27.8 26.0 - 34.0 pg   MCHC 31.5 30.0 - 36.0 g/dL   RDW 13.7 11.5 - 15.5 %   Platelets 200 150 - 400 K/uL   nRBC 0.0 0.0 - 0.2 %   Neutrophils Relative % 56 %   Neutro Abs 4.8 1.7 - 7.7 K/uL   Lymphocytes Relative 32 %   Lymphs Abs 2.7 0.7 - 4.0 K/uL   Monocytes Relative 9 %   Monocytes Absolute 0.8 0.1 - 1.0 K/uL   Eosinophils Relative 2 %   Eosinophils Absolute 0.2 0.0 - 0.5 K/uL   Basophils Relative 1 %   Basophils Absolute 0.0 0.0 - 0.1 K/uL   Immature Granulocytes 0 %   Abs Immature Granulocytes 0.02 0.00 - 0.07 K/uL  Glucose, capillary  Result Value Ref Range   Glucose-Capillary 130 (H)  70 - 99 mg/dL  Glucose, capillary  Result Value Ref Range   Glucose-Capillary 171 (H) 70 - 99 mg/dL  Glucose, capillary  Result Value Ref Range   Glucose-Capillary 108 (H) 70 - 99 mg/dL  Glucose, capillary  Result Value Ref Range   Glucose-Capillary 97 70 - 99 mg/dL  Glucose, capillary  Result Value Ref Range   Glucose-Capillary 114 (H) 70 - 99 mg/dL  Glucose, capillary  Result Value Ref Range   Glucose-Capillary 115 (H) 70 - 99 mg/dL  Glucose, capillary  Result Value Ref Range   Glucose-Capillary 147 (H) 70 - 99 mg/dL  Glucose, capillary  Result Value Ref Range   Glucose-Capillary 124 (H) 70 - 99 mg/dL  Glucose, capillary  Result Value Ref Range   Glucose-Capillary 119 (H) 70 - 99 mg/dL    Discharge Medications:   Allergies as of 06/14/2018      Reactions   Penicillins Swelling, Other (See Comments)    Has patient had a PCN reaction causing immediate rash, facial/tongue/throat swelling, SOB or lightheadedness with hypotension: YES Has patient had a PCN reaction causing severe rash involving mucus membranes or skin necrosis: NO Has patient had a PCN reaction that required hospitalization NO Has patient had a PCN reaction occurring within the last 10 years: NO If all of the above answers are "NO", then may proceed with Cephalosporin use.   Hydrocodone-acetaminophen Itching   confusion   Latex Rash   Sulfa Antibiotics Diarrhea, Itching, Rash      Medication List    STOP taking these medications   aspirin EC 81 MG tablet   diclofenac sodium 1 % Gel Commonly known as:  VOLTAREN   meloxicam 15 MG tablet Commonly known as:  MOBIC   oxyCODONE-acetaminophen 5-325 MG tablet Commonly known as:  PERCOCET     TAKE these medications   acetaminophen 325 MG tablet Commonly known as:  TYLENOL Take 1-2 tablets (325-650 mg total) by mouth every 6 (six) hours as needed for mild pain (pain score 1-3 or temp > 100.5).   albuterol 108 (90 Base) MCG/ACT inhaler Commonly known as:  PROVENTIL HFA;VENTOLIN HFA Inhale 2 puffs into the lungs every 6 (six) hours as needed for wheezing or shortness of breath.   amLODipine 5 MG tablet Commonly known as:  NORVASC Take 5 mg by mouth daily.   atorvastatin 40 MG tablet Commonly known as:  LIPITOR Take 40 mg by mouth at bedtime.   benzonatate 100 MG capsule Commonly known as:  TESSALON PERLES Take 1 capsule (100 mg total) by mouth 3 (three) times daily as needed for cough (cough).   carvedilol 10 MG 24 hr capsule Commonly known as:  COREG CR Take 10 mg by mouth daily.   cyclobenzaprine 5 MG tablet Commonly known as:  FLEXERIL Take 1 tablet (5 mg total) by mouth at bedtime.   furosemide 40 MG tablet Commonly known as:  LASIX Take 40 mg by mouth daily.   gabapentin 300 MG capsule Commonly known as:  NEURONTIN Take 2 capsules (600 mg total) by  mouth 3 (three) times daily.   isosorbide dinitrate 20 MG tablet Commonly known as:  ISORDIL Take 20 mg by mouth 2 (two) times daily.   KOMBIGLYZE XR 5-500 MG Tb24 Generic drug:  Saxagliptin-Metformin Take 1 tablet by mouth every morning.   lidocaine 5 % Commonly known as:  LIDODERM Place 1 patch onto the skin daily. Remove & Discard patch within 12 hours or as directed by MD What changed:  when to take this  reasons to take this   lisinopril 20 MG tablet Commonly known as:  PRINIVIL,ZESTRIL Take 20 mg by mouth daily.   loperamide 2 MG tablet Commonly known as:  IMODIUM A-D Take 4 mg by mouth 4 (four) times daily as needed for diarrhea or loose stools.   MYRBETRIQ 25 MG Tb24 tablet Generic drug:  mirabegron ER Take 25 mg by mouth daily.   omeprazole 20 MG capsule Commonly known as:  PRILOSEC Take 20 mg by mouth 2 (two) times daily.   oxyCODONE 5 MG immediate release tablet Commonly known as:  Oxy IR/ROXICODONE Take 1-2 tablets (5-10 mg total) by mouth every 4 (four) hours as needed for moderate pain (pain score 4-6).   potassium chloride 10 MEQ tablet Commonly known as:  KLOR-CON M10 Take 1 tablet (10 mEq total) by mouth 2 (two) times daily.   rivaroxaban 10 MG Tabs tablet Commonly known as:  XARELTO Take 1 tablet (10 mg total) by mouth daily with breakfast.   sertraline 100 MG tablet Commonly known as:  ZOLOFT Take 100 mg by mouth daily.   Vitamin D (Ergocalciferol) 1.25 MG (50000 UT) Caps capsule Commonly known as:  DRISDOL Take 1 capsule (50,000 Units total) by mouth every 7 (seven) days.       Diagnostic Studies: Dg C-arm 1-60 Min  Result Date: 06/11/2018 CLINICAL DATA:  Left anterior hip Fluoro time 2 min .05 seconds EXAM: OPERATIVE LEFT HIP (WITH PELVIS IF PERFORMED) 2 VIEWS TECHNIQUE: Fluoroscopic spot image(s) were submitted for interpretation post-operatively. COMPARISON:  04/28/2018 FINDINGS: Frontal images demonstrate interval LEFT hip total  arthroplasty. The femoral head component is well seated in the acetabular component on the frontal views provided. Degenerative changes are seen in the RIGHT hip. No interval fracture. IMPRESSION: Interval LEFT hip total arthroplasty. Electronically Signed   By: Nolon Nations M.D.   On: 06/11/2018 13:40   Dg Hip Port Unilat With Pelvis 1v Left  Result Date: 06/11/2018 CLINICAL DATA:  S/p left hip replacement surgery for arthritis EXAM: DG HIP (WITH OR WITHOUT PELVIS) 1V PORT LEFT COMPARISON:  04/28/2018 FINDINGS: Patient has undergone interval LEFT total hip arthroplasty. The components are well-seated and there is no dislocation. Expected soft tissue gas. Degenerative changes are present in the RIGHT hip and the SI joints. IMPRESSION: Uncomplicated new LEFT total hip arthroplasty. Electronically Signed   By: Nolon Nations M.D.   On: 06/11/2018 13:55   Dg Hip Operative Unilat With Pelvis Left  Result Date: 06/11/2018 CLINICAL DATA:  Left anterior hip Fluoro time 2 min .05 seconds EXAM: OPERATIVE LEFT HIP (WITH PELVIS IF PERFORMED) 2 VIEWS TECHNIQUE: Fluoroscopic spot image(s) were submitted for interpretation post-operatively. COMPARISON:  04/28/2018 FINDINGS: Frontal images demonstrate interval LEFT hip total arthroplasty. The femoral head component is well seated in the acetabular component on the frontal views provided. Degenerative changes are seen in the RIGHT hip. No interval fracture. IMPRESSION: Interval LEFT hip total arthroplasty. Electronically Signed   By: Nolon Nations M.D.   On: 06/11/2018 13:40    Disposition: Discharge disposition: 03-Skilled Nursing Facility       Discharge Instructions    Call MD / Call 911   Complete by:  As directed    If you experience chest pain or shortness of breath, CALL 911 and be transported to the hospital emergency room.  If you develope a fever above 101 F, pus (white drainage) or increased drainage or redness at the wound, or calf pain,  call  your surgeon's office.   Constipation Prevention   Complete by:  As directed    Drink plenty of fluids.  Prune juice may be helpful.  You may use a stool softener, such as Colace (over the counter) 100 mg twice a day.  Use MiraLax (over the counter) for constipation as needed.   Diet - low sodium heart healthy   Complete by:  As directed    Discharge instructions   Complete by:  As directed    Weightbearing as tolerated with crutches or walker Okay to shower dressing is waterproof Return to clinic in 10 days   Increase activity slowly as tolerated   Complete by:  As directed          Signed: Anderson Malta 06/14/2018, 9:12 AM

## 2018-06-14 NOTE — Progress Notes (Signed)
Currently pending [facility] insurance auth at [facility].   [place], [place]

## 2018-06-14 NOTE — Progress Notes (Signed)
Physical Therapy Treatment Patient Details Name: [name] MRN: [id number] DOB: 06-23-1960 Today's Date: 06/14/2018    History of Present Illness Patient is 58 y/o female s/p L THA. PMH includes DM, HTN, HLD, OSA on CPAP, RA, dCHF, hx of cancer, hx of TIA, hx of L TKA, hx of PE, chest pain, anxiety, and asthma.     PT Comments    Patient continues to make progress toward PT goals. Current plan remains appropriate.    Follow Up Recommendations  Follow surgeon's recommendation for DC plan and follow-up therapies     Equipment Recommendations  Other (comment)(TBD at next venue)    Recommendations for Other Services       Precautions / Restrictions Precautions Precautions: Fall Restrictions Weight Bearing Restrictions: Yes LLE Weight Bearing: Weight bearing as tolerated    Mobility  Bed Mobility Overal bed mobility: Needs Assistance Bed Mobility: Supine to Sit     Supine to sit: Min guard     General bed mobility comments: use of be rails and increased time and effort; min guard for safety  Transfers Overall transfer level: Needs assistance Equipment used: Rolling walker (2 wheeled) Transfers: Sit to/from Stand Sit to Stand: Min guard         General transfer comment: min guard for safety from EOB and BSC  Ambulation/Gait Ambulation/Gait assistance: Min assist;Min guard Gait Distance (Feet): 100 Feet Assistive device: Rolling walker (2 wheeled) Gait Pattern/deviations: Step-through pattern;Decreased weight shift to left;Antalgic;Decreased stride length Gait velocity: decreased   General Gait Details: several standing rest breaks due to pain; cues for posture and maintaining safe proximity to [employer] Mobility    Modified Rankin (Stroke Patients Only)       Balance Overall balance assessment: Needs assistance Sitting-balance support: Feet supported;No upper extremity supported Sitting balance-Leahy Scale:  Good     Standing balance support: Single extremity supported Standing balance-Leahy Scale: Poor Standing balance comment: single UE support needed with static standing and bilat UE support for gait                             Cognition Arousal/Alertness: Awake/alert Behavior During Therapy: WFL for tasks assessed/performed Overall Cognitive Status: Within Functional Limits for tasks assessed                                        Exercises      General Comments        Pertinent Vitals/Pain Pain Assessment: Faces Faces Pain Scale: Hurts whole lot Pain Location: L hip Pain Descriptors / Indicators: Guarding;Sore;Crying Pain Intervention(s): Limited activity within patient's tolerance;Monitored during session;Premedicated before session;Repositioned    Home Living                      Prior Function            PT Goals (current goals can now be found in the care plan section) Acute Rehab PT Goals Patient Stated Goal: be able to work part time Progress towards PT goals: Progressing toward goals    Frequency    7X/week      PT Plan Current plan remains appropriate    Co-evaluation              AM-PAC PT "  6 Clicks" Mobility   Outcome Measure  Help needed turning from your back to your side while in a flat bed without using bedrails?: None Help needed moving from lying on your back to sitting on the side of a flat bed without using bedrails?: A Little Help needed moving to and from a bed to a chair (including a wheelchair)?: A Little Help needed standing up from a chair using your arms (e.g., wheelchair or bedside chair)?: A Little Help needed to walk in hospital room?: A Little Help needed climbing 3-5 steps with a railing? : A Little 6 Click Score: 19    End of Session Equipment Utilized During Treatment: Gait belt Activity Tolerance: Patient tolerated treatment well Patient left: with call bell/phone within  reach;in chair Nurse Communication: Mobility status PT Visit Diagnosis: Muscle weakness (generalized) (M62.81);Difficulty in walking, not elsewhere classified (R26.2);Other abnormalities of gait and mobility (R26.89);Pain Pain - Right/Left: Left Pain - part of body: Hip     Time: 4497-5300 PT Time Calculation (min) (ACUTE ONLY): 31 min  Charges:  $Gait Training: 23-37 mins                     Earney Navy, PTA Acute Rehabilitation Services Pager: [phone] Office: [phone]     Darliss Cheney 06/14/2018, 10:58 AM

## 2018-06-15 DIAGNOSIS — M1612 Unilateral primary osteoarthritis, left hip: Principal | ICD-10-CM

## 2018-06-15 LAB — GLUCOSE, CAPILLARY
Glucose-Capillary: 135 mg/dL — ABNORMAL HIGH (ref 70–99)
Glucose-Capillary: 127 mg/dL — ABNORMAL HIGH (ref 70–99)

## 2018-06-15 NOTE — Clinical Social Work Note (Signed)
Clinical Social Worker facilitated patient discharge including contacting patient family and facility to confirm patient discharge plans.  Clinical information faxed to facility and family agreeable with plan.  CSW arranged ambulance transport via [facility] to [facility] room 601 .  RN to call [phone] for report prior to discharge.  Clinical Social Worker will sign off for now as social work intervention is no longer needed. Please consult us again if new need arises.  Forrester Blando, LCSWA [phone]  

## 2018-06-15 NOTE — Progress Notes (Signed)
Subjective: Pt stable - pain ok   Objective: Vital signs in last 24 hours: Temp:  [98.8 F (37.1 C)-99.2 F (37.3 C)] 98.9 F (37.2 C) (02/22 0523) Pulse Rate:  [84-89] 89 (02/22 0523) Resp:  [15-18] 15 (02/22 0523) BP: (110-128)/(61-70) 121/69 (02/22 0523) SpO2:  [93 %-97 %] 93 % (02/22 0523)  Intake/Output from previous day: 02/21 0701 - 02/22 0700 In: 480 [P.O.:480] Out: -  Intake/Output this shift: Total I/O In: 360 [P.O.:360] Out: -   Exam:  Dorsiflexion/Plantar flexion intact  Labs: Recent Labs    06/12/18 1321  HGB 11.1*   Recent Labs    06/12/18 1321  WBC 8.5  RBC 3.99  HCT 35.2*  PLT 200   No results for input(s): NA, K, CL, CO2, BUN, CREATININE, GLUCOSE, CALCIUM in the last 72 hours. No results for input(s): LABPT, INR in the last 72 hours.  Assessment/Plan: Looks like dc for mon for snf - dc tramadol   G Alphonzo Severance 06/15/2018, 10:11 AM

## 2018-06-15 NOTE — Progress Notes (Signed)
Attempted to call report to [facility]. Unable to hear the nurse on first attempt. Second attempt telephone went to a voicemail with no option to leave a message. Will attempt again when patient leaves hospital. Massie Bougie, RN

## 2018-06-15 NOTE — Progress Notes (Signed)
Subjective: 4 Days Post-Op Procedure(s) (LRB): LEFT TOTAL HIP ARTHROPLASTY ANTERIOR APPROACH (Left) Patient reports pain as mild.  No other complaints this am.   Objective: Vital signs in last 24 hours: Temp:  [98.8 F (37.1 C)-99.2 F (37.3 C)] 98.9 F (37.2 C) (02/22 0523) Pulse Rate:  [84-89] 89 (02/22 0523) Resp:  [15-18] 15 (02/22 0523) BP: (110-128)/(61-70) 121/69 (02/22 0523) SpO2:  [93 %-97 %] 93 % (02/22 0523)  Intake/Output from previous day: 02/21 0701 - 02/22 0700 In: 480 [P.O.:480] Out: -  Intake/Output this shift: No intake/output data recorded.  Recent Labs    06/12/18 1321  HGB 11.1*   Recent Labs    06/12/18 1321  WBC 8.5  RBC 3.99  HCT 35.2*  PLT 200   No results for input(s): NA, K, CL, CO2, BUN, CREATININE, GLUCOSE, CALCIUM in the last 72 hours. No results for input(s): LABPT, INR in the last 72 hours.  Neurologically intact Neurovascular intact Sensation intact distally Intact pulses distally Dorsiflexion/Plantar flexion intact Incision: dressing C/D/I No cellulitis present Compartment soft   Assessment/Plan: 4 Days Post-Op Procedure(s) (LRB): LEFT TOTAL HIP ARTHROPLASTY ANTERIOR APPROACH (Left) Advance diet Up with therapy D/C IV fluids Discharge to SNF Monday (would benefit from more PT) WBAT LLE Dry dressing change prn       Aundra Dubin 06/15/2018, 6:59 AM

## 2018-06-15 NOTE — Clinical Social Work Placement (Signed)
   CLINICAL SOCIAL WORK PLACEMENT  NOTE  Date:  06/15/2018  Patient Details  Name: [name] MRN: [id number] Date of Birth: [birth year]  Clinical Social Work is seeking post-discharge placement for this patient at the [place] level of care (*CSW will initial, date and re-position this form in  chart as items are completed):      Patient/family provided with [place] Work Department's list of facilities offering this level of care within the geographic area requested by the patient (or if unable, by the patient's family).  Yes   Patient/family informed of their freedom to choose among providers that offer the needed level of care, that participate in Medicare, Medicaid or managed care program needed by the patient, have an available bed and are willing to accept the patient.      Patient/family informed of [facility]'s ownership interest in [facility] and [facility], as well as of the fact that they are under no obligation to receive care at these facilities.  PASRR submitted to EDS on       PASRR number received on 06/13/18     Existing PASRR number confirmed on       FL2 transmitted to all facilities in geographic area requested by pt/family on 06/13/18     FL2 transmitted to all facilities within larger geographic area on       Patient informed that his/her managed care company has contracts with or will negotiate with certain facilities, including the following:        Yes   Patient/family informed of bed offers received.  Patient chooses bed at [facility]     Physician recommends and patient chooses bed at      Patient to be transferred to [facility] on 06/15/18.  Patient to be transferred to facility by [facility]     Patient family notified on 06/15/18 of transfer.  Name of family member notified:  Charlena Cross --daughter     PHYSICIAN       Additional Comment:     _______________________________________________ Eileen Stanford, LCSW 06/15/2018, 10:41 AM

## 2018-06-16 IMAGING — CR DG CHEST 2V
2 series · 2 of 2 positions shown · non-contrast
Comparison: 05/21/2016

CLINICAL DATA: Preop for left knee replacement

EXAM:
CHEST  2 VIEW

[w chest pa]
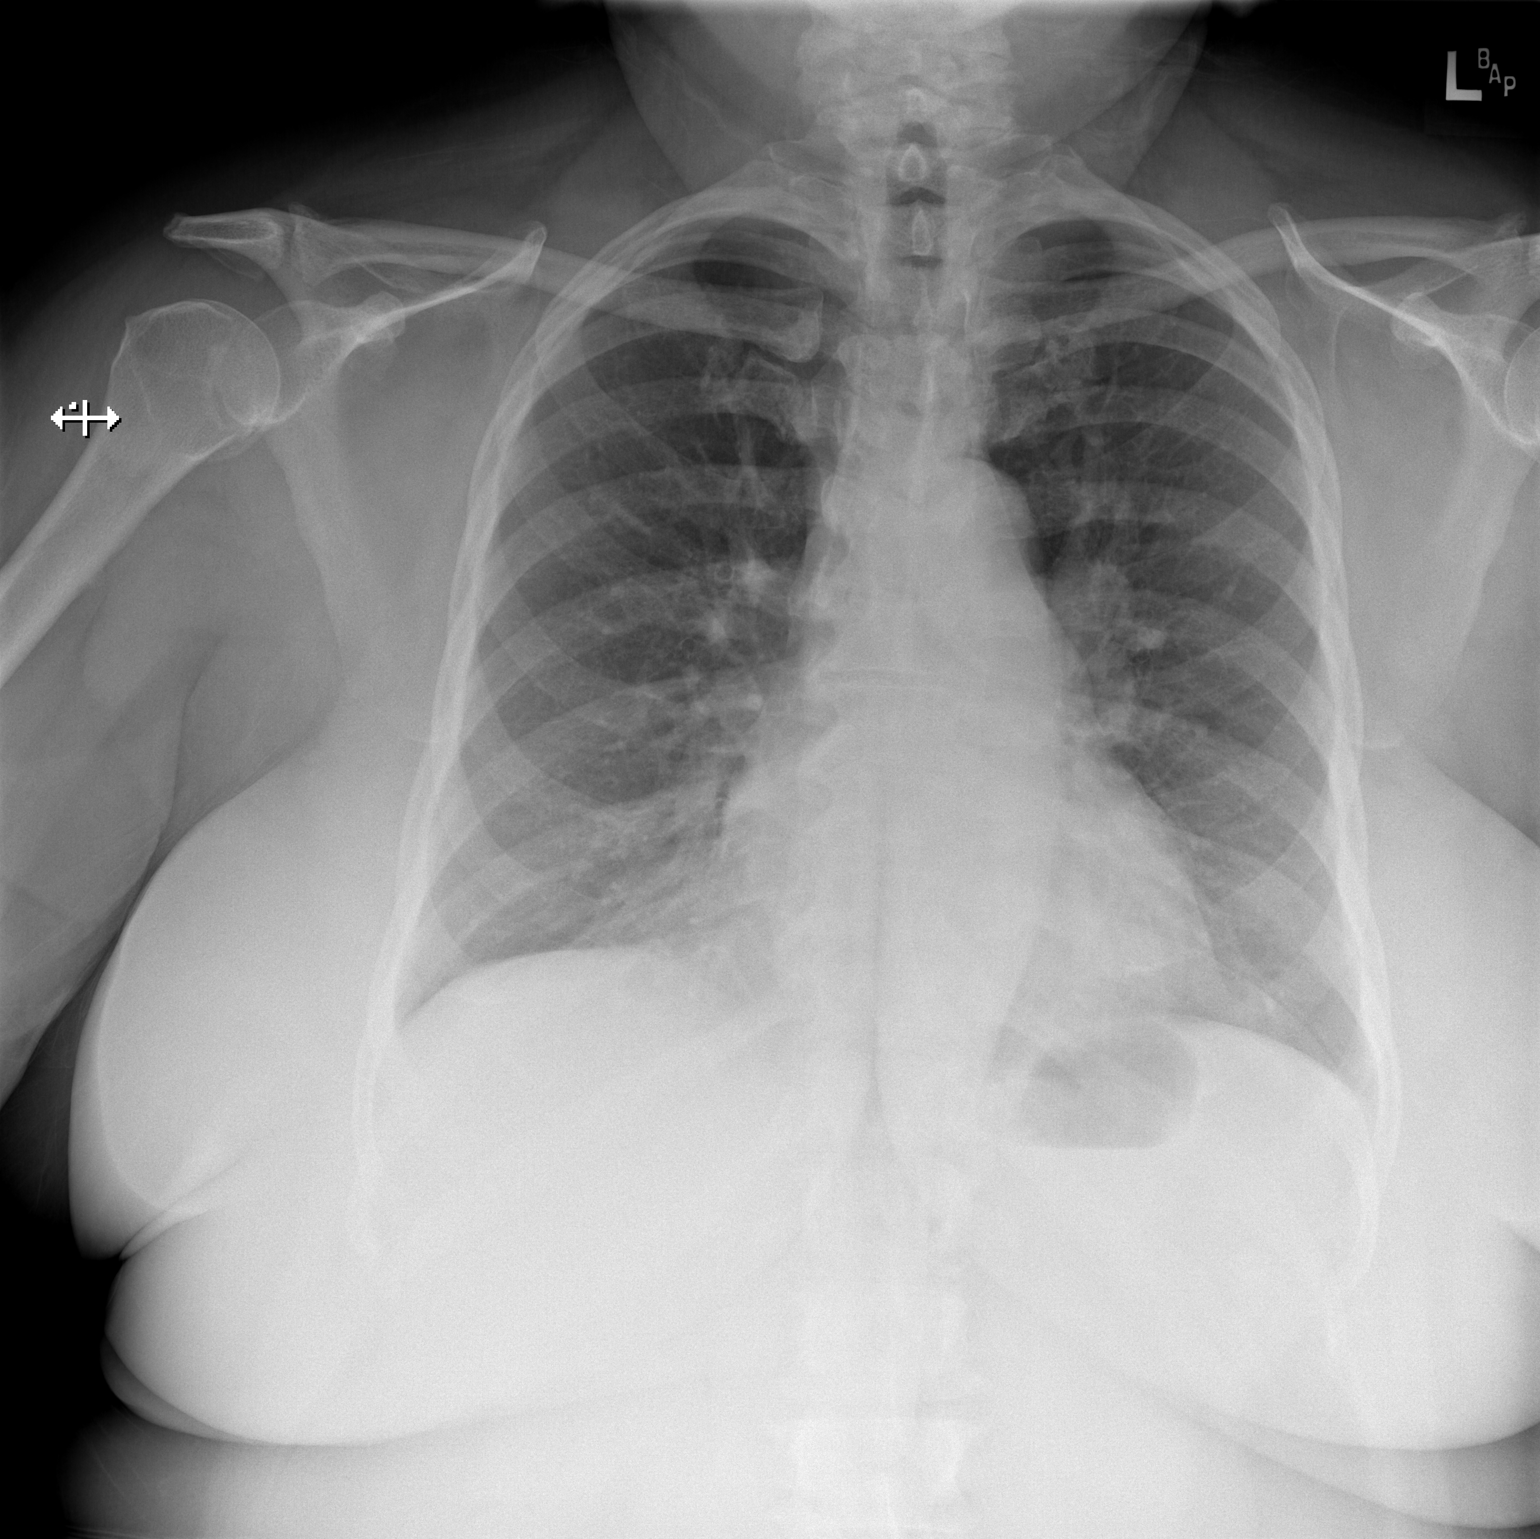

[w chest lat]
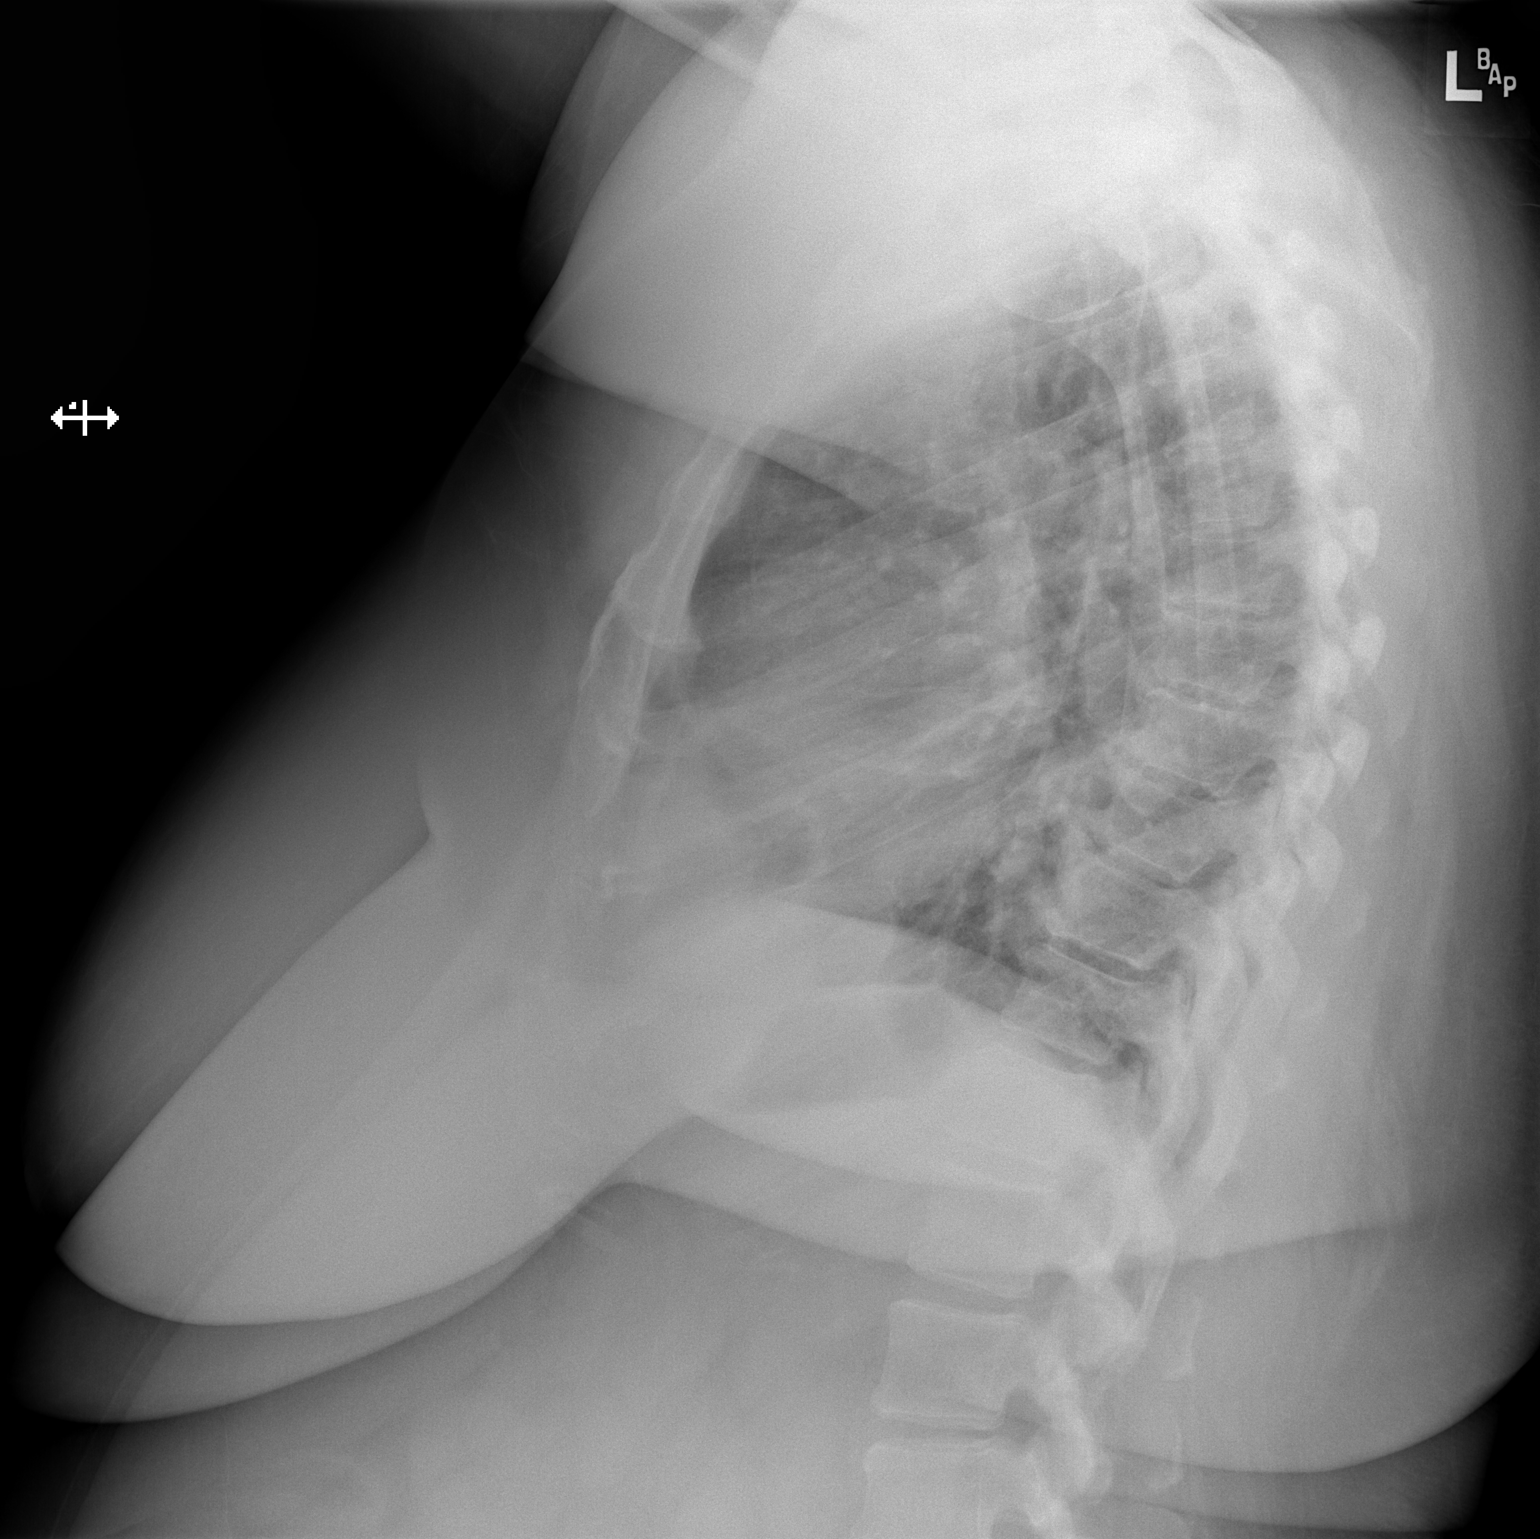

[2 of 2 positions shown; findings below may reference images not displayed]

FINDINGS: Cardiomediastinal silhouette is stable. No infiltrate or pleural
effusion. No pulmonary edema. Mild degenerative changes mid thoracic
spine. Moderate degenerative changes lower thoracic spine. Again
noted prominent fat pad in right cardiophrenic angle.
IMPRESSION: No active cardiopulmonary disease. Degenerative changes thoracic
spine.

## 2018-06-22 DIAGNOSIS — M1612 Unilateral primary osteoarthritis, left hip: Secondary | ICD-10-CM

## 2018-06-24 ENCOUNTER — Telehealth (INDEPENDENT_AMBULATORY_CARE_PROVIDER_SITE_OTHER): Payer: Self-pay | Admitting: Orthopedic Surgery

## 2018-06-24 ENCOUNTER — Ambulatory Visit: Payer: Medicare Other | Admitting: Cardiovascular Disease

## 2018-06-24 NOTE — Telephone Encounter (Signed)
Patient called stating that her incision has opened up and wanted to know if she needs to be seen sooner than her appointment on March 4th.  CB#[phone].  Thank you.

## 2018-06-24 NOTE — Telephone Encounter (Signed)
I tried calling. No answer. LM for her advising was calling her back to try to get her worked in this afternoon to see Dr Marlou Sa.

## 2018-06-26 ENCOUNTER — Ambulatory Visit (INDEPENDENT_AMBULATORY_CARE_PROVIDER_SITE_OTHER): Payer: Medicare Other | Admitting: Orthopedic Surgery

## 2018-06-26 ENCOUNTER — Ambulatory Visit (INDEPENDENT_AMBULATORY_CARE_PROVIDER_SITE_OTHER): Payer: Self-pay

## 2018-06-26 ENCOUNTER — Encounter (INDEPENDENT_AMBULATORY_CARE_PROVIDER_SITE_OTHER): Payer: Self-pay | Admitting: Orthopedic Surgery

## 2018-06-26 DIAGNOSIS — M79641 Pain in right hand: Secondary | ICD-10-CM | POA: Diagnosis not present

## 2018-06-26 DIAGNOSIS — Z96642 Presence of left artificial hip joint: Secondary | ICD-10-CM | POA: Diagnosis not present

## 2018-06-26 NOTE — Progress Notes (Signed)
Post-Op Visit Note   Patient: [name]           Date of Birth: Sep 12, 1960           MRN: [id number] Visit Date: 06/26/2018 PCP: Tsosie Billing, MD   Assessment & Plan:  Chief Complaint:  Chief Complaint  Patient presents with  . Left Hip - Routine Post Op  . Right Hand - Injury    Shut hand in car door on 06/25/2018   Visit Diagnoses:  1. Status post total replacement of left hip   2. Pain in right hand     Plan: Patient is now 2 weeks out left total hip replacement.  On exam her leg lengths are equal.  She has been doing reasonably well in the nursing home increasing her walking endurance.  Incision looks intact.  No drainage.  Some areas are little bit slower to heal particularly in the middle portion.  I clean the incision today and we can redress it.  Would like for that to stay on for about a week.  She did shut her fingers in a car door on 06/25/2018.  Her extensor mechanism and flexion mechanism intact at the DIP and PIP joints of digits 2 3 and 4 which were the ones involved.  X-rays negative for fracture.  I will see her back in 3 weeks for clinical recheck.  No radiographs needed at that time.  Follow-Up Instructions: Return in about 3 weeks (around 07/17/2018).   Orders:  Orders Placed This Encounter  Procedures  . XR HIP UNILAT W OR W/O PELVIS 2-3 VIEWS LEFT  . XR Hand Complete Right   No orders of the defined types were placed in this encounter.   Imaging: Xr Hip Unilat W Or W/o Pelvis 2-3 Views Left  Result Date: 06/26/2018 AP pelvis lateral left hip reviewed.  Total hip prosthesis in good position alignment with approximately equal leg lengths.  No evidence of complicating features.  Xr Hand Complete Right  Result Date: 06/26/2018 AP lateral oblique right hand reviewed.  Mild degenerative changes noted diffusely throughout the joints of the hand but no acute fractures present no dislocation present.   PMFS History: Patient Active Problem  List   Diagnosis Date Noted  . Unilateral primary osteoarthritis, left hip   . Hip arthritis 06/11/2018  . Class 3 severe obesity with serious comorbidity and body mass index (BMI) of 40.0 to 44.9 in adult ([place]) 05/29/2018  . Other fatigue 11/20/2017  . Shortness of breath on exertion 11/20/2017  . Type 2 diabetes mellitus without complication, without long-term current use of insulin ([place]) 11/20/2017  . Vitamin D deficiency 11/20/2017  . Depression 11/20/2017  . Other hyperlipidemia 11/20/2017  . 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 Presence of left artificial knee joint 09/20/2016  . Arthritis of knee 08/22/2016  . Cervical radiculopathy 07/26/2016  . Cervical disc disorder with radiculopathy 07/26/2016  . Right arm pain 06/29/2016  . Cervicalgia 06/29/2016  . Primary osteoarthritis of left knee 06/29/2016  . Hypersomnia with sleep apnea 11/18/2015  . Lethargy 11/18/2015  . Chest pain 09/30/2014  . Abnormal cardiac function test 09/29/2014  . Chest pain with moderate risk for cardiac etiology 09/28/2014  . HTN (hypertension) 09/28/2014  . Hypokalemia   . Paresthesia 06/18/2014  . Numbness and tingling of left arm and leg   . Unstable angina ([place]) 05/24/2014  . OSA on CPAP 05/24/2014  . Diabetes mellitus type 2 in obese ([place]) 10/29/2012  .  S/P laparoscopic appendectomy 06/04/2012  . Diastolic CHF ([place]) 83/38/2505  . Mediastinal mass 01/09/2012  . SOB (shortness of breath) 06/20/2011  . Mediastinal abnormality 06/20/2011  . Dyslipidemia 12/23/2009  . GLAUCOMA 12/23/2009  . ARTHRITIS 12/23/2009  . 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 Morbid obesity-BMI 45 09/13/2006  . ANXIETY STATE NOS 09/13/2006  . DISORDER, DEPRESSIVE NEC 09/13/2006  . CARPAL TUNNEL SYNDROME, MILD 09/13/2006  . Unspecified essential hypertension 09/13/2006  . IBS 09/13/2006  . DEGENERATION, LUMBAR/LUMBOSACRAL DISC 09/13/2006  . SYMPTOM, SWELLING/MASS/LUMP IN CHEST 09/13/2006  . PULMONARY EMBOLISM, HX OF  09/13/2006   Past Medical History:  Diagnosis Date  . Anginal pain ([place])    a. NL cath in 2008;  b. Myoview 03/2011: dec uptake along mid anterior wall on stress imaging -> ? attenuation vs. ischemia, EF 65%;  c. Echo 04/2011: EF 55-60%, no RWMA, Gr 2 dd  . Anxiety   . Arthritis   . Asthma   . Back pain   . Bone cancer ([place])   . Cancer ([place])   . Chest pain   . CHF (congestive heart failure) ([place])   . CHF (congestive heart failure) ([place])   . Depression   . Diabetes mellitus   . Drug use   . Dyspnea    with exertion  . Dyspnea   . Frequent urination   . GERD (gastroesophageal reflux disease)   . Glaucoma   . HLD (hyperlipidemia)   . Hypertension   . IBS (irritable bowel syndrome)   . Joint pain   . Lactose intolerance   . Leg edema   . 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 Obesity   . Palpitations   . Pneumonia 05/2016   double  . Pulmonary edema   . Pulmonary embolism ([place])    a. 2008 -> coumadin x 6 mos.  . Rheumatoid arthritis ([place])   . Sleep apnea    on CPAP 02/2018  . TIA (transient ischemic attack)   . Urinary urgency     Family History  Problem Relation Age of Onset  . Emphysema Mother   . Arthritis Mother   . Heart failure Mother        alive @ 65  . Stroke Mother   . Diabetes Mother   . Hypertension Mother   . Hyperlipidemia Mother   . Depression Mother   . Anxiety disorder Mother   . Asthma Brother   . Heart disease Father        died @ 53's.  . Stroke Father   . Diabetes Father   . Hyperlipidemia Father   . Hypertension Father   . Bipolar disorder Father   . Sleep apnea Father   . Alcoholism Father   . Drug abuse Father   . Diabetes Sister   . Heart disease Paternal Grandfather   . Colon cancer Maternal Grandfather   . Breast cancer Maternal Aunt     Past Surgical History:  Procedure Laterality Date  . ABDOMINAL HYSTERECTOMY  2005  . APPENDECTOMY    . CARDIAC CATHETERIZATION     Normal  . CARDIAC CATHETERIZATION  N/A 09/30/2014   Procedure: Left Heart Cath and Coronary Angiography;  Surgeon: Sherren Mocha, MD;  Location: [facility];  Service: Cardiovascular;  Laterality: N/A;  . LAPAROSCOPIC APPENDECTOMY N/A 06/03/2012   Procedure: APPENDECTOMY LAPAROSCOPIC;  Surgeon: Stark Klein, MD;  Location: [facility];  Service: General;  Laterality: N/A;  . Left knee  surgery  2008  . LEG SURGERY    . TONSILLECTOMY    . TOTAL HIP ARTHROPLASTY Left 06/11/2018   Procedure: LEFT TOTAL HIP ARTHROPLASTY ANTERIOR APPROACH;  Surgeon: Meredith Pel, MD;  Location: [facility];  Service: Orthopedics;  Laterality: Left;  . TOTAL KNEE ARTHROPLASTY Left 08/22/2016   Procedure: TOTAL KNEE ARTHROPLASTY;  Surgeon: Meredith Pel, MD;  Location: [facility];  Service: Orthopedics;  Laterality: Left;  . TUBAL LIGATION  1989   Social History   Occupational History  . Occupation: UNEMPLOYED    Employer: DISABLED  Tobacco Use  . Smoking status: Former Smoker    Packs/day: 0.50    Years: 15.00    Pack years: 7.50    Types: Cigarettes    Last attempt to quit: 04/24/1985    Years since quitting: 33.1  . Smokeless tobacco: Never Used  Substance and Sexual Activity  . Alcohol use: No    Alcohol/week: 0.0 standard drinks  . Drug use: Not Currently    Types: Marijuana  . Sexual activity: Never

## 2018-07-03 ENCOUNTER — Telehealth (INDEPENDENT_AMBULATORY_CARE_PROVIDER_SITE_OTHER): Payer: Self-pay | Admitting: Orthopedic Surgery

## 2018-07-03 NOTE — Telephone Encounter (Signed)
Can you advise thanks.

## 2018-07-03 NOTE — Telephone Encounter (Signed)
There are no restrictions with an anterior total hip.  She is activities as tolerated.  Regards to shower since she is 2 weeks postop she can shower she could actually get the incision wet as long as there is no dehiscence of the wound.

## 2018-07-03 NOTE — Telephone Encounter (Signed)
IC s/w pt and advised. 

## 2018-07-03 NOTE — Telephone Encounter (Signed)
Patient wants to know if its ok to shower and what precautions if any she should take. Also if the bandage should stay off or put back on after showering.

## 2018-07-04 ENCOUNTER — Other Ambulatory Visit (INDEPENDENT_AMBULATORY_CARE_PROVIDER_SITE_OTHER): Payer: Self-pay | Admitting: Orthopedic Surgery

## 2018-07-05 ENCOUNTER — Telehealth (INDEPENDENT_AMBULATORY_CARE_PROVIDER_SITE_OTHER): Payer: Self-pay | Admitting: Orthopedic Surgery

## 2018-07-05 NOTE — Telephone Encounter (Signed)
Please advise if she can continue

## 2018-07-05 NOTE — Telephone Encounter (Signed)
Patient called asked if she can get someone to come to her home for (PT) Patient said she have not had (PT) since she left the rehab center 06/28/2018. The number to contact patient is [phone]

## 2018-07-05 NOTE — Telephone Encounter (Signed)
Holding for you and Dr. Marlou Sa.

## 2018-07-08 ENCOUNTER — Ambulatory Visit (INDEPENDENT_AMBULATORY_CARE_PROVIDER_SITE_OTHER): Payer: Medicare Other | Admitting: Physician Assistant

## 2018-07-08 ENCOUNTER — Encounter (INDEPENDENT_AMBULATORY_CARE_PROVIDER_SITE_OTHER): Payer: Self-pay | Admitting: Physician Assistant

## 2018-07-08 ENCOUNTER — Other Ambulatory Visit: Payer: Self-pay

## 2018-07-08 VITALS — BP 120/75 | HR 80 | Temp 98.2°F | Ht <= 58 in | Wt 204.0 lb

## 2018-07-08 DIAGNOSIS — E559 Vitamin D deficiency, unspecified: Secondary | ICD-10-CM | POA: Diagnosis not present

## 2018-07-08 DIAGNOSIS — E119 Type 2 diabetes mellitus without complications: Secondary | ICD-10-CM | POA: Diagnosis not present

## 2018-07-08 DIAGNOSIS — Z6841 Body Mass Index (BMI) 40.0 and over, adult: Secondary | ICD-10-CM | POA: Diagnosis not present

## 2018-07-08 MED ORDER — VITAMIN D (ERGOCALCIFEROL) 1.25 MG (50000 UNIT) PO CAPS: 50000.0000 [IU] | ORAL_CAPSULE | ORAL | 1 refills | Status: DC

## 2018-07-08 NOTE — Telephone Encounter (Signed)
Faxed order to [facility] at [facility] [address]

## 2018-07-08 NOTE — Telephone Encounter (Signed)
Please advise. Had THA.

## 2018-07-08 NOTE — Telephone Encounter (Signed)
Okay for home health PT for range of motion and strengthening 2 times a week for 4 weeks.  Please call thanks

## 2018-07-08 NOTE — Telephone Encounter (Signed)
y

## 2018-07-08 NOTE — Progress Notes (Signed)
Office: [phone]  /  Fax: [phone]   HPI:   Chief Complaint: OBESITY [name] is here to discuss her progress with her obesity treatment plan. She is on the Category 2 plan and is following her eating plan approximately 70% of the time. She states she is doing leg lifts 10-15 minutes 4 times per week. [name] is recovering from a left hip replacement performed on 06/11/2018. She states she has been unable to eat completely on the plan due to mobility restrictions. Her weight is 204 lb (92.5 kg) today and has had a weight gain of 2 lbs since her last visit. She has lost 21 lbs since starting treatment with [name].  Vitamin D deficiency [name] has a diagnosis of Vitamin D deficiency. She is currently taking prescription Vit D and denies nausea, vomiting or muscle weakness.  Diabetes II [name] has a diagnosis of diabetes type II. [name] states fasting blood sugars are in the 120's anddenies any hypoglycemic episodes. Last A1c was reported at 6.6 on 05/22/2018. She has been working on intensive lifestyle modifications including diet, exercise, and weight loss to help control her blood glucose levels. She is currently on Kombiglyze and denies nausea, vomiting, or diarrhea.  ASSESSMENT AND PLAN:  Vitamin D deficiency - Plan: Vitamin D, Ergocalciferol, (DRISDOL) 1.25 MG (50000 UT) CAPS capsule  Type 2 diabetes mellitus without complication, without long-term current use of insulin (HCC)  Class 3 severe obesity with serious comorbidity and body mass index (BMI) of 40.0 to 44.9 in adult, unspecified obesity type ([place])  PLAN:  Vitamin D Deficiency [name] was informed that low Vitamin D levels contributes to fatigue and are associated with obesity, breast, and colon cancer. She agrees to continue to take prescription Vit D @ 50,000 IU every week #4 with 0 refills and will follow-up for routine testing of Vitamin D, at least 2-3 times per year. She was informed of the risk of over-replacement of  Vitamin D and agrees to not increase her dose unless she discusses this with [name] first. [name] agrees to follow-up with our clinic in 3 weeks.  Diabetes II [name] has been given extensive diabetes education by myself today including ideal fasting and post-prandial blood glucose readings, individual ideal HgA1c goals  and hypoglycemia prevention. We discussed the importance of good blood sugar control to decrease the likelihood of diabetic complications such as nephropathy, neuropathy, limb loss, blindness, coronary artery disease, and death. We discussed the importance of intensive lifestyle modification including diet, exercise and weight loss as the first line treatment for diabetes. [name] agrees to continue her diabetes medications and will follow-up at the agreed upon time.  Obesity [name] is currently in the action stage of change. As such, her goal is to continue with weight loss efforts. She has agreed to follow the Category 2 plan and additional lunch options were given. [name] has been instructed to work up to a goal of 150 minutes of combined cardio and strengthening exercise per week for weight loss and overall health benefits. We discussed the following Behavioral Modification Strategies today: increasing lean protein intake and work on meal planning and easy cooking plans.  [name] has agreed to follow-up with our clinic in 3 weeks. She was informed of the importance of frequent follow-up visits to maximize her success with intensive lifestyle modifications for her multiple health conditions.  ALLERGIES: Allergies  Allergen Reactions  . Penicillins Swelling and Other (See Comments)    Has patient had a PCN reaction causing immediate rash, facial/tongue/throat  swelling, SOB or lightheadedness with hypotension: YES Has patient had a PCN reaction causing severe rash involving mucus membranes or skin necrosis: NO Has patient had a PCN reaction that required hospitalization NO Has patient  had a PCN reaction occurring within the last 10 years: NO If all of the above answers are "NO", then may proceed with Cephalosporin use.  [name] Hydrocodone-Acetaminophen Itching    confusion  . Latex Rash  . Sulfa Antibiotics Diarrhea, Itching and Rash    MEDICATIONS: Current Outpatient Medications on File Prior to Visit  Medication Sig Dispense Refill  . acetaminophen (TYLENOL) 325 MG tablet Take 1-2 tablets (325-650 mg total) by mouth every 6 (six) hours as needed for mild pain (pain score 1-3 or temp > 100.5). 60 tablet 0  . albuterol (PROVENTIL HFA;VENTOLIN HFA) 108 (90 Base) MCG/ACT inhaler Inhale 2 puffs into the lungs every 6 (six) hours as needed for wheezing or shortness of breath.    [name] amLODipine (NORVASC) 5 MG tablet Take 5 mg by mouth daily.  6  . atorvastatin (LIPITOR) 40 MG tablet Take 40 mg by mouth at bedtime.     . benzonatate (TESSALON PERLES) 100 MG capsule Take 1 capsule (100 mg total) by mouth 3 (three) times daily as needed for cough (cough). 20 capsule 0  . carvedilol (COREG CR) 10 MG 24 hr capsule Take 10 mg by mouth daily.    . cyclobenzaprine (FLEXERIL) 5 MG tablet Take 1 tablet (5 mg total) by mouth at bedtime. 15 tablet 0  . furosemide (LASIX) 40 MG tablet Take 40 mg by mouth daily.    [name] gabapentin (NEURONTIN) 300 MG capsule Take 2 capsules (600 mg total) by mouth 3 (three) times daily. 180 capsule 2  . isosorbide dinitrate (ISORDIL) 20 MG tablet Take 20 mg by mouth 2 (two) times daily.     [name] KOMBIGLYZE XR 5-500 MG TB24 Take 1 tablet by mouth every morning.     . lidocaine (LIDODERM) 5 % Place 1 patch onto the skin daily. Remove & Discard patch within 12 hours or as directed by MD (Patient taking differently: Place 1 patch onto the skin daily as needed (for pain). Remove & Discard patch within 12 hours or as directed by MD) 30 patch 0  . lisinopril (PRINIVIL,ZESTRIL) 20 MG tablet Take 20 mg by mouth daily.     [name] loperamide (IMODIUM A-D) 2 MG tablet Take 4 mg by mouth 4  (four) times daily as needed for diarrhea or loose stools.     . meloxicam (MOBIC) 15 MG tablet TAKE 1 TABLET BY MOUTH EVERY DAY X2 WEEKS 30 tablet 0  . MYRBETRIQ 25 MG TB24 tablet Take 25 mg by mouth daily.  5  . omeprazole (PRILOSEC) 20 MG capsule Take 20 mg by mouth 2 (two) times daily.    [name] oxyCODONE (OXY IR/ROXICODONE) 5 MG immediate release tablet Take 1-2 tablets (5-10 mg total) by mouth every 4 (four) hours as needed for moderate pain (pain score 4-6). 30 tablet 0  . potassium chloride (KLOR-CON M10) 10 MEQ tablet Take 1 tablet (10 mEq total) by mouth 2 (two) times daily. 60 tablet 1  . rivaroxaban (XARELTO) 10 MG TABS tablet Take 1 tablet (10 mg total) by mouth daily with breakfast. 21 tablet 0  . sertraline (ZOLOFT) 100 MG tablet Take 100 mg by mouth daily.      No current facility-administered medications on file prior to visit.     PAST MEDICAL HISTORY: Past Medical  History:  Diagnosis Date  . Anginal pain ([place])    a. NL cath in 2008;  b. Myoview 03/2011: dec uptake along mid anterior wall on stress imaging -> ? attenuation vs. ischemia, EF 65%;  c. Echo 04/2011: EF 55-60%, no RWMA, Gr 2 dd  . Anxiety   . Arthritis   . Asthma   . Back pain   . Bone cancer ([place])   . Cancer ([place])   . Chest pain   . CHF (congestive heart failure) ([place])   . CHF (congestive heart failure) ([place])   . Depression   . Diabetes mellitus   . Drug use   . Dyspnea    with exertion  . Dyspnea   . Frequent urination   . GERD (gastroesophageal reflux disease)   . Glaucoma   . HLD (hyperlipidemia)   . Hypertension   . IBS (irritable bowel syndrome)   . Joint pain   . Lactose intolerance   . Leg edema   . 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 Obesity   . Palpitations   . Pneumonia 05/2016   double  . Pulmonary edema   . Pulmonary embolism ([place])    a. 2008 -> coumadin x 6 mos.  . Rheumatoid arthritis ([place])   . Sleep apnea    on CPAP 02/2018  . TIA (transient ischemic  attack)   . Urinary urgency     PAST SURGICAL HISTORY: Past Surgical History:  Procedure Laterality Date  . ABDOMINAL HYSTERECTOMY  2005  . APPENDECTOMY    . CARDIAC CATHETERIZATION     Normal  . CARDIAC CATHETERIZATION N/A 09/30/2014   Procedure: Left Heart Cath and Coronary Angiography;  Surgeon: Sherren Mocha, MD;  Location: [facility];  Service: Cardiovascular;  Laterality: N/A;  . LAPAROSCOPIC APPENDECTOMY N/A 06/03/2012   Procedure: APPENDECTOMY LAPAROSCOPIC;  Surgeon: Stark Klein, MD;  Location: [facility];  Service: General;  Laterality: N/A;  . Left knee surgery  2008  . LEG SURGERY    . TONSILLECTOMY    . TOTAL HIP ARTHROPLASTY Left 06/11/2018   Procedure: LEFT TOTAL HIP ARTHROPLASTY ANTERIOR APPROACH;  Surgeon: Meredith Pel, MD;  Location: [facility];  Service: Orthopedics;  Laterality: Left;  . TOTAL KNEE ARTHROPLASTY Left 08/22/2016   Procedure: TOTAL KNEE ARTHROPLASTY;  Surgeon: Meredith Pel, MD;  Location: [facility];  Service: Orthopedics;  Laterality: Left;  . TUBAL LIGATION  1989    SOCIAL HISTORY: Social History   Tobacco Use  . Smoking status: Former Smoker    Packs/day: 0.50    Years: 15.00    Pack years: 7.50    Types: Cigarettes    Last attempt to quit: 04/24/1985    Years since quitting: 33.2  . Smokeless tobacco: Never Used  Substance Use Topics  . Alcohol use: No    Alcohol/week: 0.0 standard drinks  . Drug use: Not Currently    Types: Marijuana    FAMILY HISTORY: Family History  Problem Relation Age of Onset  . Emphysema Mother   . Arthritis Mother   . Heart failure Mother        alive @ 26  . Stroke Mother   . Diabetes Mother   . Hypertension Mother   . Hyperlipidemia Mother   . Depression Mother   . Anxiety disorder Mother   . Asthma Brother   . Heart disease Father        died @ 43's.  [name]  Stroke Father   . Diabetes Father   . Hyperlipidemia Father   . Hypertension Father   . Bipolar disorder Father   . Sleep apnea Father    . Alcoholism Father   . Drug abuse Father   . Diabetes Sister   . Heart disease Paternal Grandfather   . Colon cancer Maternal Grandfather   . Breast cancer Maternal Aunt    ROS: Review of Systems  Constitutional: Negative for weight loss.  Gastrointestinal: Negative for diarrhea, nausea and vomiting.  Musculoskeletal:       Negative for muscle weakness.  Endo/Heme/Allergies:       Negative for hypoglycemia.   PHYSICAL EXAM: Blood pressure 120/75, pulse 80, temperature 98.2 F (36.8 C), temperature source Oral, height 4\' 10"  (1.473 m), weight 204 lb (92.5 kg), SpO2 98 %. Body mass index is 42.64 kg/m. Physical Exam Vitals signs reviewed.  Constitutional:      Appearance: Normal appearance. She is obese.  Cardiovascular:     Rate and Rhythm: Normal rate.     Pulses: Normal pulses.  Pulmonary:     Effort: Pulmonary effort is normal.     Breath sounds: Normal breath sounds.  Musculoskeletal: Normal range of motion.  Skin:    General: Skin is warm and dry.  Neurological:     Mental Status: She is alert and oriented to person, place, and time.  Psychiatric:        Behavior: Behavior normal.   RECENT LABS AND TESTS: BMET    Component Value Date/Time   NA 142 06/10/2018 1435   NA 144 03/04/2018 1152   K 3.9 06/10/2018 1435   CL 105 06/10/2018 1435   CO2 29 06/10/2018 1435   GLUCOSE 86 06/10/2018 1435   BUN 14 06/10/2018 1435   BUN 17 03/04/2018 1152   CREATININE 0.85 06/10/2018 1435   CALCIUM 9.8 06/10/2018 1435   GFRNONAA >60 06/10/2018 1435   GFRAA >60 06/10/2018 1435   Lab Results  Component Value Date   HGBA1C 6.6 (H) 05/22/2018   HGBA1C 6.8 (H) 03/04/2018   HGBA1C 8.8 (H) 11/20/2017   HGBA1C 7.4 (H) 08/11/2016   HGBA1C 6.6 (H) 05/21/2016   Lab Results  Component Value Date   INSULIN 20.2 03/04/2018   INSULIN 53.9 (H) 11/20/2017   CBC    Component Value Date/Time   WBC 8.5 06/12/2018 1321   RBC 3.99 06/12/2018 1321   HGB 11.1 (L) 06/12/2018 1321    HGB 12.8 11/20/2017 0925   HCT 35.2 (L) 06/12/2018 1321   HCT 40.6 11/20/2017 0925   PLT 200 06/12/2018 1321   MCV 88.2 06/12/2018 1321   MCV 85 11/20/2017 0925   MCH 27.8 06/12/2018 1321   MCHC 31.5 06/12/2018 1321   RDW 13.7 06/12/2018 1321   RDW 13.9 11/20/2017 0925   LYMPHSABS 2.7 06/12/2018 1321   LYMPHSABS 2.1 11/20/2017 0925   MONOABS 0.8 06/12/2018 1321   EOSABS 0.2 06/12/2018 1321   EOSABS 0.4 11/20/2017 0925   BASOSABS 0.0 06/12/2018 1321   BASOSABS 0.0 11/20/2017 0925   Iron/TIBC/Ferritin/ %Sat No results found for: IRON, TIBC, FERRITIN, IRONPCTSAT Lipid Panel     Component Value Date/Time   CHOL 162 03/04/2018 1152   TRIG 117 03/04/2018 1152   HDL 50 03/04/2018 1152   CHOLHDL 4.2 04/23/2011 0830   CHOLHDL 4.3 04/23/2011 0830   VLDL 29 04/23/2011 0830   VLDL 29 04/23/2011 0830   LDLCALC 89 03/04/2018 1152   Hepatic Function Panel  Component Value Date/Time   PROT 6.4 03/04/2018 1152   ALBUMIN 4.1 03/04/2018 1152   AST 19 03/04/2018 1152   ALT 40 (H) 03/04/2018 1152   ALKPHOS 61 03/04/2018 1152   BILITOT 0.4 03/04/2018 1152   BILIDIR 0.1 05/22/2016 0913   IBILI 0.6 05/22/2016 0913      Component Value Date/Time   TSH 0.638 11/20/2017 0925   TSH 0.112 (L) 05/24/2016 0629   TSH 0.345 (L) 06/18/2014 0930   TSH 0.167 (L) 10/28/2012 0144   Results for STEPHANYE, FINNICUM (MRN [id number]) as of 07/08/2018 15:27  Ref. Range 03/04/2018 11:52  Vitamin D, 25-Hydroxy Latest Ref Range: 30.0 - 100.0 ng/mL 34.5   OBESITY BEHAVIORAL INTERVENTION VISIT  Today's visit was #12   Starting weight: 225 lbs Starting date: 11/20/2017 Today's weight: 204 lbs  Today's date: 07/08/2018 Total lbs lost to date: 21 At least 15 minutes were spent on discussing the following behavioral intervention visit.    07/08/2018  Height 4\' 10"  (1.473 m)  Weight 204 lb (92.5 kg)  BMI (Calculated) 42.65  BLOOD PRESSURE - SYSTOLIC 195  BLOOD PRESSURE - DIASTOLIC 75   Body Fat  % 51.9 %   ASK: We discussed the diagnosis of obesity with Edwyna Ready today and Angelene agreed to give [name] permission to discuss obesity behavioral modification therapy today.  ASSESS: Merriam has the diagnosis of obesity and her BMI today is 42.65. Nasra is in the action stage of change.   ADVISE: Winna was educated on the multiple health risks of obesity as well as the benefit of weight loss to improve her health. She was advised of the need for long term treatment and the importance of lifestyle modifications to improve her current health and to decrease her risk of future health problems.  AGREE: Multiple dietary modification options and treatment options were discussed and  Naveen agreed to follow the recommendations documented in the above note.  ARRANGE: [place] was educated on the importance of frequent visits to treat obesity as outlined per CMS and USPSTF guidelines and agreed to schedule her next follow up appointment today.  Migdalia Dk, am acting as transcriptionist for Abby Potash, PA-C I, Abby Potash, PA-C have reviewed above note and agree with its content

## 2018-07-09 ENCOUNTER — Telehealth (INDEPENDENT_AMBULATORY_CARE_PROVIDER_SITE_OTHER): Payer: Self-pay | Admitting: Orthopedic Surgery

## 2018-07-09 NOTE — Telephone Encounter (Signed)
[name] called to get verbal orders for home health physical therapy for 2 week for 4 weeks

## 2018-07-09 NOTE — Telephone Encounter (Signed)
IC, no answer. LMVM with verbal orders.

## 2018-07-12 ENCOUNTER — Other Ambulatory Visit (INDEPENDENT_AMBULATORY_CARE_PROVIDER_SITE_OTHER): Payer: Self-pay | Admitting: Orthopedic Surgery

## 2018-07-12 NOTE — Telephone Encounter (Signed)
Ok to rf? 

## 2018-07-15 NOTE — Telephone Encounter (Signed)
No need to at this time ok for asa 81 mg po qd for 8 weeks thx

## 2018-07-16 ENCOUNTER — Encounter (INDEPENDENT_AMBULATORY_CARE_PROVIDER_SITE_OTHER): Payer: Self-pay

## 2018-07-19 ENCOUNTER — Telehealth (INDEPENDENT_AMBULATORY_CARE_PROVIDER_SITE_OTHER): Payer: Self-pay | Admitting: Radiology

## 2018-07-19 NOTE — Telephone Encounter (Signed)
Called and spoke with patient, patient answered NO to all screening questions.  

## 2018-07-22 ENCOUNTER — Other Ambulatory Visit: Payer: Self-pay

## 2018-07-22 ENCOUNTER — Ambulatory Visit (INDEPENDENT_AMBULATORY_CARE_PROVIDER_SITE_OTHER): Payer: Medicare Other | Admitting: Orthopedic Surgery

## 2018-07-22 ENCOUNTER — Encounter (INDEPENDENT_AMBULATORY_CARE_PROVIDER_SITE_OTHER): Payer: Self-pay | Admitting: Orthopedic Surgery

## 2018-07-22 DIAGNOSIS — Z96642 Presence of left artificial hip joint: Secondary | ICD-10-CM

## 2018-07-22 MED ORDER — OXYCODONE HCL 5 MG PO TABS: ORAL_TABLET | ORAL | 0 refills | Status: DC

## 2018-07-22 NOTE — Addendum Note (Signed)
Addended byLaurann [place] on: 07/22/2018 01:03 PM   Modules accepted: Orders

## 2018-07-22 NOTE — Progress Notes (Signed)
Post-Op Visit Note   Patient: [name]           Date of Birth: 12/08/1960           MRN: [id number] Visit Date: 07/22/2018 PCP: Tsosie Billing, MD   Assessment & Plan:  Chief Complaint:  Chief Complaint  Patient presents with  . Left Hip - Follow-up   Visit Diagnoses:  1. Status post total replacement of left hip     Plan: [name] is a patient is now about 6 weeks out left total hip replacement.  She is been doing well.  Ambulating with a cane.  She is making progress slowly but she is making good progress.  Still reports a little bit of numbness in the fingertips and surgery.  On exam she has improving hip flexion strength and equal leg lengths.  I am going to have her continue with home health physical therapy for 2 more weeks and then I think she will not need any more therapy after that.  She should just do a home exercise program.  Consider nerve conduction if her numbness persist.  Follow-Up Instructions: Return in about 3 months (around 10/22/2018).   Orders:  No orders of the defined types were placed in this encounter.  No orders of the defined types were placed in this encounter.   Imaging: No results found.  PMFS History: Patient Active Problem List   Diagnosis Date Noted  . Unilateral primary osteoarthritis, left hip   . Hip arthritis 06/11/2018  . Class 3 severe obesity with serious comorbidity and body mass index (BMI) of 40.0 to 44.9 in adult ([place]) 05/29/2018  . Other fatigue 11/20/2017  . Shortness of breath on exertion 11/20/2017  . Type 2 diabetes mellitus without complication, without long-term current use of insulin ([place]) 11/20/2017  . Vitamin D deficiency 11/20/2017  . Depression 11/20/2017  . Other hyperlipidemia 11/20/2017  . 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 Presence of left artificial knee joint 09/20/2016  . Arthritis of knee 08/22/2016  . Cervical radiculopathy 07/26/2016  . Cervical disc disorder with  radiculopathy 07/26/2016  . Right arm pain 06/29/2016  . Cervicalgia 06/29/2016  . Primary osteoarthritis of left knee 06/29/2016  . Hypersomnia with sleep apnea 11/18/2015  . Lethargy 11/18/2015  . Chest pain 09/30/2014  . Abnormal cardiac function test 09/29/2014  . Chest pain with moderate risk for cardiac etiology 09/28/2014  . HTN (hypertension) 09/28/2014  . Hypokalemia   . Paresthesia 06/18/2014  . Numbness and tingling of left arm and leg   . Unstable angina ([place]) 05/24/2014  . OSA on CPAP 05/24/2014  . Diabetes mellitus type 2 in obese ([place]) 10/29/2012  . S/P laparoscopic appendectomy 06/04/2012  . Diastolic CHF ([place]) 88/28/0034  . Mediastinal mass 01/09/2012  . SOB (shortness of breath) 06/20/2011  . Mediastinal abnormality 06/20/2011  . Dyslipidemia 12/23/2009  . GLAUCOMA 12/23/2009  . ARTHRITIS 12/23/2009  . 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 Morbid obesity-BMI 45 09/13/2006  . ANXIETY STATE NOS 09/13/2006  . DISORDER, DEPRESSIVE NEC 09/13/2006  . CARPAL TUNNEL SYNDROME, MILD 09/13/2006  . Unspecified essential hypertension 09/13/2006  . IBS 09/13/2006  . DEGENERATION, LUMBAR/LUMBOSACRAL DISC 09/13/2006  . SYMPTOM, SWELLING/MASS/LUMP IN CHEST 09/13/2006  . PULMONARY EMBOLISM, HX OF 09/13/2006   Past Medical History:  Diagnosis Date  . Anginal pain ([place])    a. NL cath in 2008;  b. Myoview 03/2011: dec uptake along mid anterior wall on stress imaging -> ? attenuation vs.  ischemia, EF 65%;  c. Echo 04/2011: EF 55-60%, no RWMA, Gr 2 dd  . Anxiety   . Arthritis   . Asthma   . Back pain   . Bone cancer ([place])   . Cancer ([place])   . Chest pain   . CHF (congestive heart failure) ([place])   . CHF (congestive heart failure) ([place])   . Depression   . Diabetes mellitus   . Drug use   . Dyspnea    with exertion  . Dyspnea   . Frequent urination   . GERD (gastroesophageal reflux disease)   . Glaucoma   . HLD (hyperlipidemia)   . Hypertension   . IBS (irritable bowel  syndrome)   . Joint pain   . Lactose intolerance   . Leg edema   . 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 Obesity   . Palpitations   . Pneumonia 05/2016   double  . Pulmonary edema   . Pulmonary embolism ([place])    a. 2008 -> coumadin x 6 mos.  . Rheumatoid arthritis ([place])   . Sleep apnea    on CPAP 02/2018  . TIA (transient ischemic attack)   . Urinary urgency     Family History  Problem Relation Age of Onset  . Emphysema Mother   . Arthritis Mother   . Heart failure Mother        alive @ 32  . Stroke Mother   . Diabetes Mother   . Hypertension Mother   . Hyperlipidemia Mother   . Depression Mother   . Anxiety disorder Mother   . Asthma Brother   . Heart disease Father        died @ 71's.  . Stroke Father   . Diabetes Father   . Hyperlipidemia Father   . Hypertension Father   . Bipolar disorder Father   . Sleep apnea Father   . Alcoholism Father   . Drug abuse Father   . Diabetes Sister   . Heart disease Paternal Grandfather   . Colon cancer Maternal Grandfather   . Breast cancer Maternal Aunt     Past Surgical History:  Procedure Laterality Date  . ABDOMINAL HYSTERECTOMY  2005  . APPENDECTOMY    . CARDIAC CATHETERIZATION     Normal  . CARDIAC CATHETERIZATION N/A 09/30/2014   Procedure: Left Heart Cath and Coronary Angiography;  Surgeon: Sherren Mocha, MD;  Location: [facility];  Service: Cardiovascular;  Laterality: N/A;  . LAPAROSCOPIC APPENDECTOMY N/A 06/03/2012   Procedure: APPENDECTOMY LAPAROSCOPIC;  Surgeon: Stark Klein, MD;  Location: [facility];  Service: General;  Laterality: N/A;  . Left knee surgery  2008  . LEG SURGERY    . TONSILLECTOMY    . TOTAL HIP ARTHROPLASTY Left 06/11/2018   Procedure: LEFT TOTAL HIP ARTHROPLASTY ANTERIOR APPROACH;  Surgeon: Meredith Pel, MD;  Location: [facility];  Service: Orthopedics;  Laterality: Left;  . TOTAL KNEE ARTHROPLASTY Left 08/22/2016   Procedure: TOTAL KNEE ARTHROPLASTY;  Surgeon: Meredith Pel, MD;  Location: [facility];  Service: Orthopedics;  Laterality: Left;  . TUBAL LIGATION  1989   Social History   Occupational History  . Occupation: UNEMPLOYED    Employer: DISABLED  Tobacco Use  . Smoking status: Former Smoker    Packs/day: 0.50    Years: 15.00    Pack years: 7.50    Types: Cigarettes    Last attempt to quit: 04/24/1985  Years since quitting: 33.2  . Smokeless tobacco: Never Used  Substance and Sexual Activity  . Alcohol use: No    Alcohol/week: 0.0 standard drinks  . Drug use: Not Currently    Types: Marijuana  . Sexual activity: Never

## 2018-07-23 ENCOUNTER — Encounter (INDEPENDENT_AMBULATORY_CARE_PROVIDER_SITE_OTHER): Payer: Self-pay

## 2018-07-29 ENCOUNTER — Ambulatory Visit (INDEPENDENT_AMBULATORY_CARE_PROVIDER_SITE_OTHER): Payer: Medicare Other | Admitting: Physician Assistant

## 2018-08-01 ENCOUNTER — Telehealth (INDEPENDENT_AMBULATORY_CARE_PROVIDER_SITE_OTHER): Payer: Self-pay | Admitting: Orthopedic Surgery

## 2018-08-01 NOTE — Telephone Encounter (Signed)
Gennaro Africa, PT, with [facility] left a message requesting VO for higher level of care to transition back into community.  She would like to see her 1x a week for 5 weeks.  CB#[phone].  Thank you.

## 2018-08-01 NOTE — Telephone Encounter (Signed)
Ok for order?  

## 2018-08-02 NOTE — Telephone Encounter (Signed)
Y thx

## 2018-08-05 IMAGING — CT CT ANGIO CHEST
2 of 6 series · 18 of 36 positions shown · IV contrast (ISOVUE 370)
Comparison: 05/21/2016

CLINICAL DATA: Short of breath

EXAM:
CT ANGIOGRAPHY CHEST WITH CONTRAST
TECHNIQUE: Multidetector CT imaging of the chest was performed using the
standard protocol during bolus administration of intravenous
contrast. Multiplanar CT image reconstructions and MIPs were
obtained to evaluate the vascular anatomy.
CONTRAST:  100 cc Isovue 370

[Series 5: coronal mpr · coronal · 0.50mm/px · 1 of 151 slices shown]
[im 76/151  mediastinal]
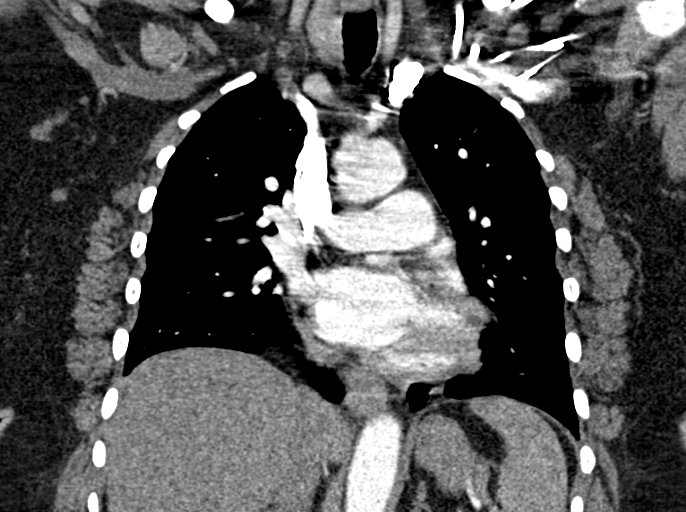

[Series 10: thins for pacs · axial · 0.62mm/px · z∈[+1609,+1840]mm · 17 of 257 slices shown]
[im 13/257  lung]
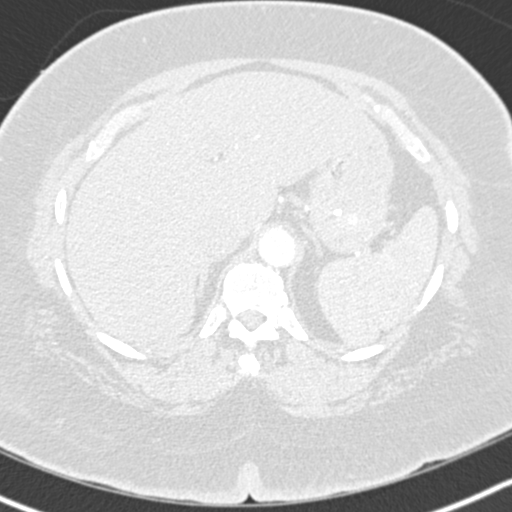
[im 26/257  mediastinal]
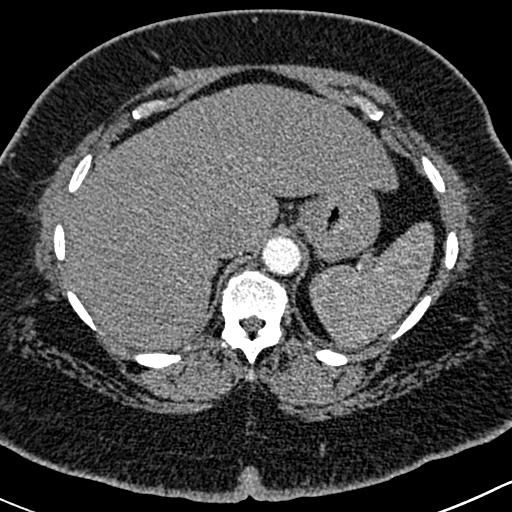
[im 39/257  lung]
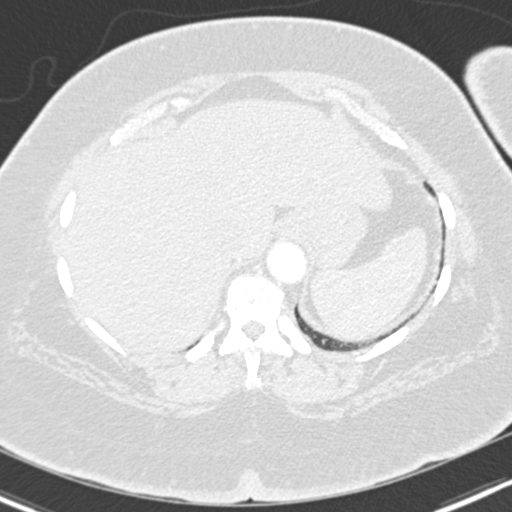
[im 52/257  mediastinal]
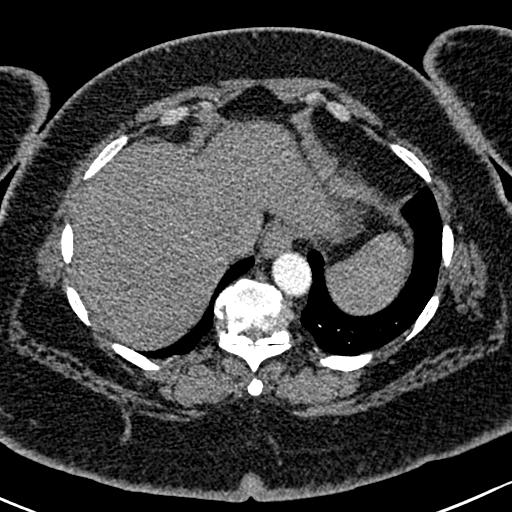
[im 77/257  lung]
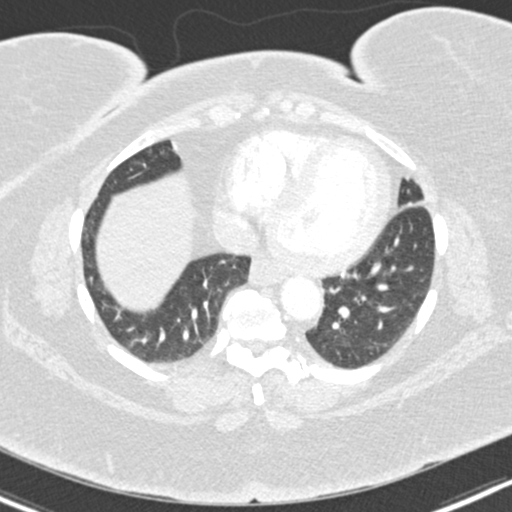
[im 90/257  mediastinal]
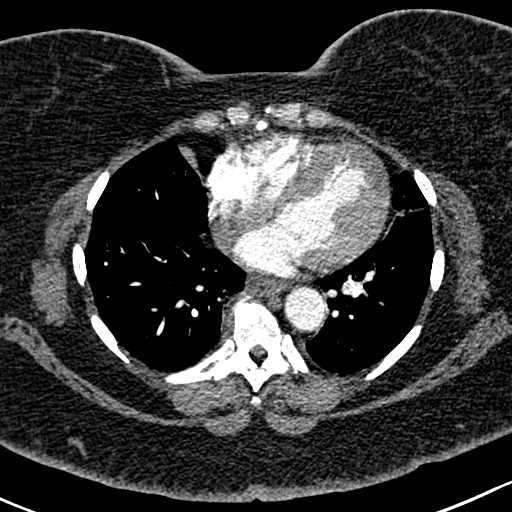
[im 103/257  lung]
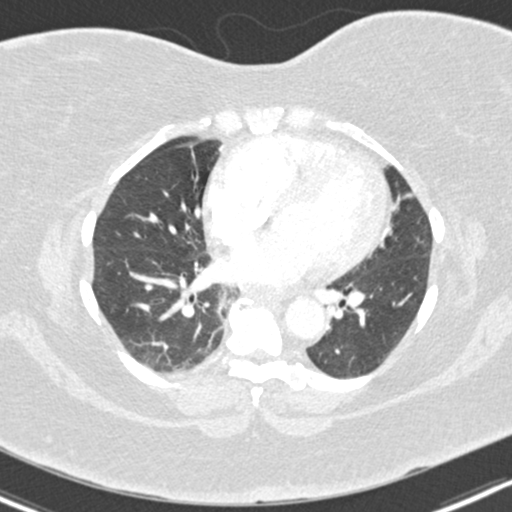
[im 116/257  mediastinal]
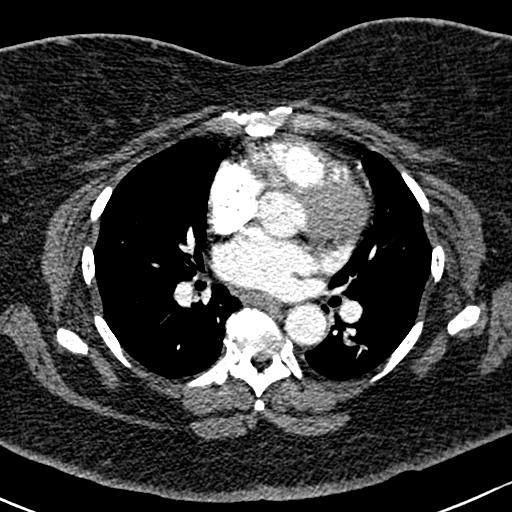
[im 129/257  lung]
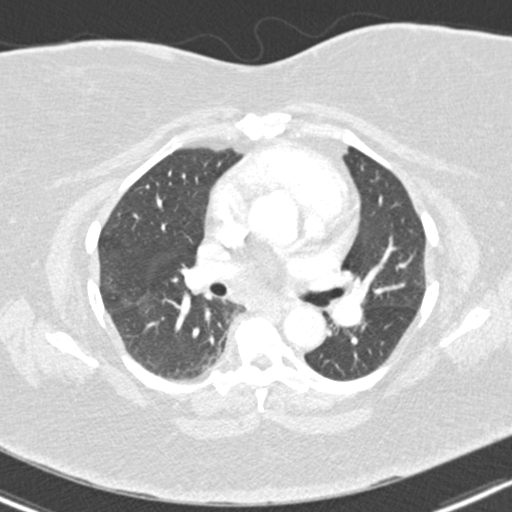
[im 141/257  mediastinal]
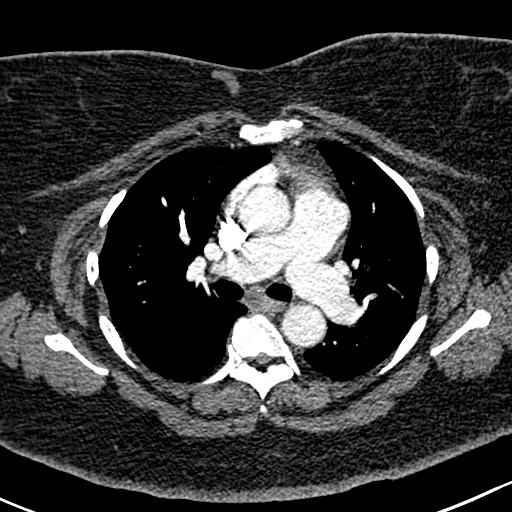
[im 154/257  lung]
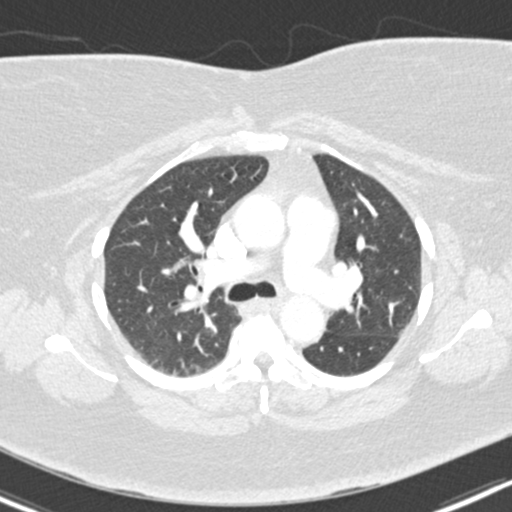
[im 167/257  mediastinal]
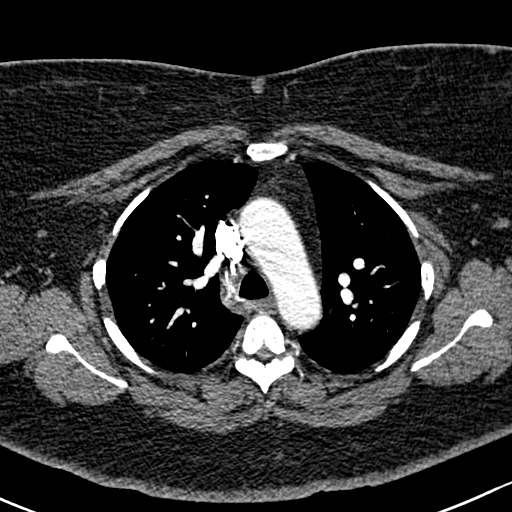
[im 180/257  lung]
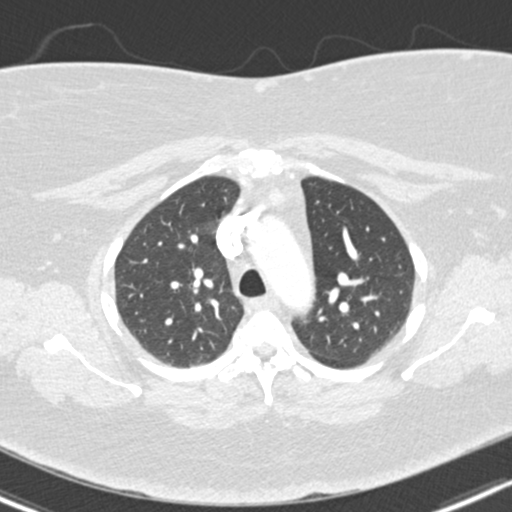
[im 205/257  mediastinal]
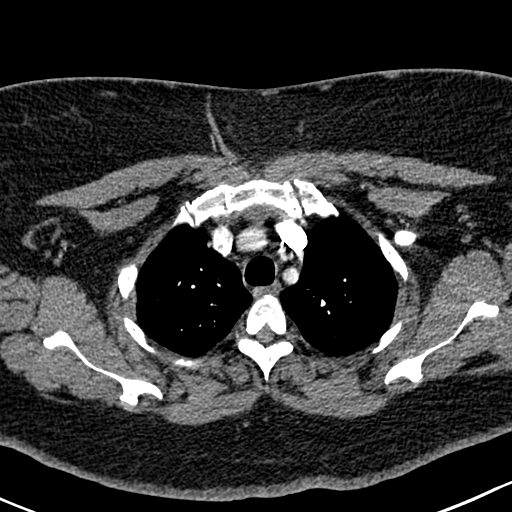
[im 218/257  lung]
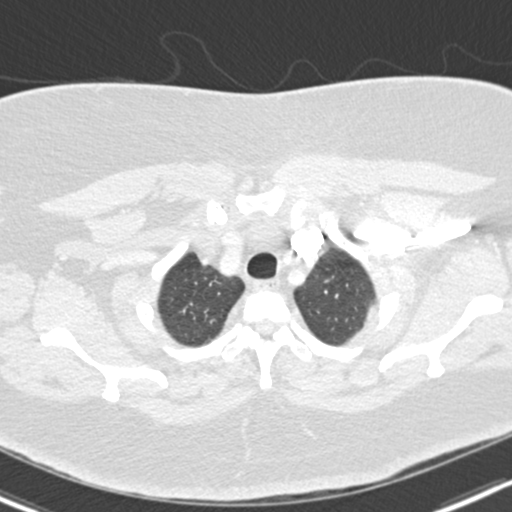
[im 231/257  mediastinal]
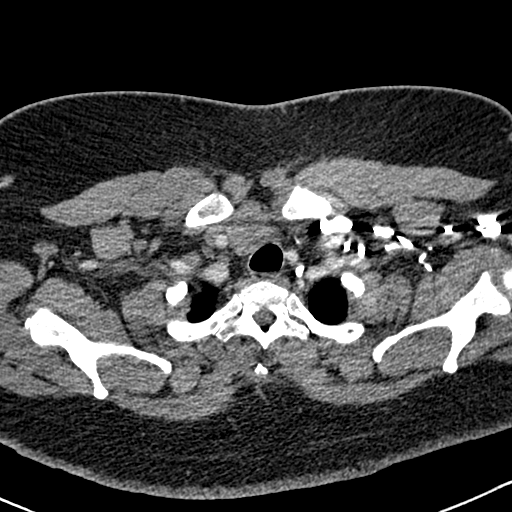
[im 244/257  lung]
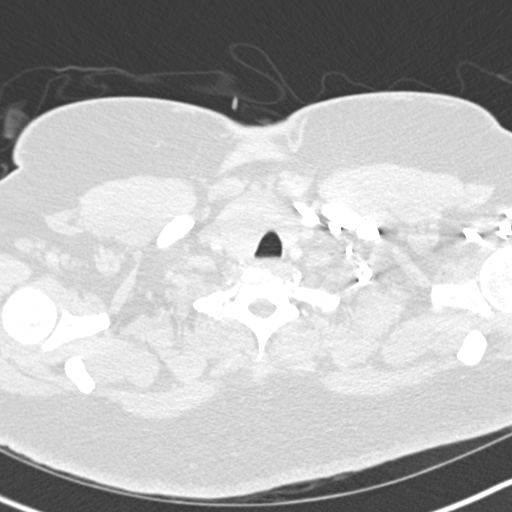

[18 of 36 positions shown; findings below may reference images not displayed]

FINDINGS: Cardiovascular: There are no filling defects in the pulmonary
arterial tree to suggest acute pulmonary thromboembolism. There is
no obvious evidence of aortic dissection or aneurysm. Great vessels
are grossly patent.

Mediastinum/Nodes: There is a 2.5 x 1.3 cm soft tissue mass in the
anterior mediastinum. See image 23 of series 4. This is not
significantly changed dating back to 0212. A smaller similar tiny
soft tissue mass is seen just inferior and to the left of the larger
mass measuring 10 mm. This was much larger on the prior study from
0212. There is no new abnormal adenopathy. The right lobe of the
thyroid gland remains enlarged.

Lungs/Pleura: No pneumothorax.  No pleural effusion

Lungs are under aerated with dependent atelectasis. Tiny 3 mm nodule
at the lateral basal segment of the left lower lobe on image 68.

Upper Abdomen: Left lobe of the liver remains prominent. This is
chronic.

Musculoskeletal: No vertebral compression deformity.

Review of the MIP images confirms the above findings.
IMPRESSION: No evidence of acute pulmonary thromboembolism

Adenopathy in the anterior mediastinum is improved compared with
0212 supporting benign etiology.

Right lobe of the thyroid gland remains enlarged.

## 2018-08-05 NOTE — Telephone Encounter (Signed)
IC verbal given.  

## 2018-08-09 ENCOUNTER — Other Ambulatory Visit (INDEPENDENT_AMBULATORY_CARE_PROVIDER_SITE_OTHER): Payer: Self-pay | Admitting: Orthopedic Surgery

## 2018-09-02 ENCOUNTER — Emergency Department (HOSPITAL_COMMUNITY)
Admission: EM | Admit: 2018-09-02 | Discharge: 2018-09-02 | Disposition: A | Discharge status: Home / Self Care | Payer: Medicare Other | Attending: Emergency Medicine | Admitting: Emergency Medicine

## 2018-09-02 ENCOUNTER — Emergency Department (HOSPITAL_COMMUNITY): Payer: Medicare Other

## 2018-09-02 ENCOUNTER — Encounter (HOSPITAL_COMMUNITY): Payer: Self-pay | Admitting: Emergency Medicine

## 2018-09-02 ENCOUNTER — Other Ambulatory Visit: Payer: Self-pay

## 2018-09-02 DIAGNOSIS — R091 Pleurisy: Secondary | ICD-10-CM

## 2018-09-02 DIAGNOSIS — E119 Type 2 diabetes mellitus without complications: Secondary | ICD-10-CM | POA: Diagnosis not present

## 2018-09-02 DIAGNOSIS — Z79899 Other long term (current) drug therapy: Secondary | ICD-10-CM | POA: Insufficient documentation

## 2018-09-02 DIAGNOSIS — Z7901 Long term (current) use of anticoagulants: Secondary | ICD-10-CM | POA: Insufficient documentation

## 2018-09-02 DIAGNOSIS — Z87891 Personal history of nicotine dependence: Secondary | ICD-10-CM | POA: Insufficient documentation

## 2018-09-02 DIAGNOSIS — J45909 Unspecified asthma, uncomplicated: Secondary | ICD-10-CM | POA: Diagnosis not present

## 2018-09-02 DIAGNOSIS — Z85830 Personal history of malignant neoplasm of bone: Secondary | ICD-10-CM | POA: Diagnosis not present

## 2018-09-02 DIAGNOSIS — R0602 Shortness of breath: Secondary | ICD-10-CM | POA: Insufficient documentation

## 2018-09-02 DIAGNOSIS — Z91040 Latex allergy status: Secondary | ICD-10-CM | POA: Insufficient documentation

## 2018-09-02 DIAGNOSIS — Z96652 Presence of left artificial knee joint: Secondary | ICD-10-CM | POA: Insufficient documentation

## 2018-09-02 DIAGNOSIS — R0789 Other chest pain: Secondary | ICD-10-CM | POA: Diagnosis present

## 2018-09-02 LAB — CBC
HCT: 37 % (ref 36.0–46.0)
Hemoglobin: 11.4 g/dL — ABNORMAL LOW (ref 12.0–15.0)
MCH: 26 pg (ref 26.0–34.0)
MCHC: 30.8 g/dL (ref 30.0–36.0)
MCV: 84.3 fL (ref 80.0–100.0)
Platelets: 245 10*3/uL (ref 150–400)
RBC: 4.39 MIL/uL (ref 3.87–5.11)
RDW: 13.9 % (ref 11.5–15.5)
WBC: 6.4 10*3/uL (ref 4.0–10.5)
nRBC: 0 % (ref 0.0–0.2)

## 2018-09-02 LAB — BASIC METABOLIC PANEL
Anion gap: 9 (ref 5–15)
BUN: 16 mg/dL (ref 6–20)
CO2: 27 mmol/L (ref 22–32)
Calcium: 9.5 mg/dL (ref 8.9–10.3)
Chloride: 106 mmol/L (ref 98–111)
Creatinine, Ser: 0.94 mg/dL (ref 0.44–1.00)
GFR calc Af Amer: >60 mL/min (ref >60)
GFR calc non Af Amer: >60 mL/min (ref >60)
Glucose, Bld: 109 mg/dL — ABNORMAL HIGH (ref 70–99)
Potassium: 3.8 mmol/L (ref 3.5–5.1)
Sodium: 142 mmol/L (ref 135–145)

## 2018-09-02 LAB — TROPONIN I: Troponin I: 0.07 ng/mL (ref <0.03)

## 2018-09-02 LAB — BRAIN NATRIURETIC PEPTIDE: B Natriuretic Peptide: 76.2 pg/mL (ref 0.0–100.0)

## 2018-09-02 LAB — D-DIMER, QUANTITATIVE: D-Dimer, Quant: 1.47 ug/mL-FEU — ABNORMAL HIGH (ref 0.00–0.50)

## 2018-09-02 MED ORDER — FENTANYL CITRATE (PF) 100 MCG/2ML IJ SOLN
100.0000 ug | Freq: Once | INTRAMUSCULAR | Status: AC
  Administered 2018-09-02: 100 ug via INTRAVENOUS
  Filled 2018-09-02: qty 2

## 2018-09-02 MED ORDER — SODIUM CHLORIDE 0.9% FLUSH: 3.0000 mL | Freq: Once | INTRAVENOUS | Status: DC

## 2018-09-02 MED ORDER — IOHEXOL 350 MG/ML SOLN
100.0000 mL | Freq: Once | INTRAVENOUS | Status: AC | PRN
  Administered 2018-09-02: 07:00:00 100 mL via INTRAVENOUS

## 2018-09-02 MED ORDER — OXYCODONE-ACETAMINOPHEN 5-325 MG PO TABS: 2.0000 | ORAL_TABLET | Freq: Four times a day (QID) | ORAL | 0 refills | Status: DC | PRN

## 2018-09-02 NOTE — Discharge Instructions (Addendum)
You were seen in the emergency department for chest pain.  You had a CAT scan that did not show any obvious signs of blood clot.  Please follow-up with your doctor for further evaluation.  Below and including the report of the CAT scan as there are some other findings that will need follow-up.  MPRESSION:  1. Negative for acute pulmonary embolism.  2. Cardiomegaly with left ventricular dilatation. The degree of  dilatation appears slightly increased compared to 09/30/2016.  3. Hypoventilation with scattered areas of subsegmental atelectasis  throughout the lower lobes.  4. Mildly enlarged main pulmonary artery at 3.1 cm raising the  possibility of underlying pulmonary arterial hypertension.  5. Right-sided thyroid goiter remains unchanged.  6. Stable anterior mediastinal adenopathy over multiple prior  studies. As previously noted, this likely represents a benign  process.

## 2018-09-02 NOTE — ED Triage Notes (Signed)
Patient reports central chest pain with mild SOB radiating to mid back onset this evening , denies emesis or diaphoresis , history of CHF, denies fever or chills , her cardiologist is Dr. Johnsie Cancel .

## 2018-09-02 NOTE — ED Provider Notes (Signed)
Signout from Dr. Christy Gentles.  58 year old female with history of PE and chronically elevated troponin here with pleuritic chest pain. Physical Exam  BP 128/69   Pulse 71   Temp 97.6 F (36.4 C) (Oral)   Resp 19   SpO2 97%   Physical Exam  ED Course/Procedures   Clinical Course as of Sep 01 816  Mon Sep 02, 2018  0753 Patient CT resulting in showing no evidence of PE.  Does show some other findings which I will include in her discharge.  I checked on the patient she said she is feeling okay.  She understands the need to follow-up with her doctor and return if any worsening symptoms.   [MB]    Clinical Course User Index [MB] Hayden Rasmussen, MD    Procedures  MDM  Plan is to follow-up on CT PE.  If negative can be discharged if positive would need anticoagulation.       Hayden Rasmussen, MD 09/02/18 [phone]

## 2018-09-02 NOTE — ED Triage Notes (Signed)
Pt in with [facility], central cp since 6pm last night. States the pain radiates to back b/t shoulders. Denies any n/v, sob or fevers.

## 2018-09-02 NOTE — ED Provider Notes (Addendum)
[facility] EMERGENCY DEPARTMENT Provider Note   CSN: [id number] Arrival date & time: 09/02/18  0430    History   Chief Complaint Chief Complaint  Patient presents with   Chest Pain    HPI [name] is a 58 y.o. female.     The history is provided by the patient.  Chest Pain  Pain location:  Substernal area Pain quality: radiating and sharp   Pain radiates to:  Upper back Pain severity:  Severe Onset quality:  Sudden Duration:  12 hours Timing:  Intermittent Progression:  Worsening Chronicity:  New Relieved by:  Nothing Worsened by:  Deep breathing Associated symptoms: shortness of breath   Associated symptoms: no abdominal pain, no cough, no fever and no vomiting   Associated symptoms comment:  Denies hemoptysis PT with previous history of PE, CHF, diabetes presents with chest pain.  She reports approximately 12 hours ago she had onset of sharp chest pain is worse with breathing.  It radiates to her back.  She reports shortness of breath.  Denies fever/hemoptysis.  Past Medical History:  Diagnosis Date   Anginal pain ([place])    a. NL cath in 2008;  b. Myoview 03/2011: dec uptake along mid anterior wall on stress imaging -> ? attenuation vs. ischemia, EF 65%;  c. Echo 04/2011: EF 55-60%, no RWMA, Gr 2 dd   Anxiety    Arthritis    Asthma    Back pain    Bone cancer (HCC)    Cancer (HCC)    Chest pain    CHF (congestive heart failure) (HCC)    CHF (congestive heart failure) ([place])    Depression    Diabetes mellitus    Drug use    Dyspnea    with exertion   Dyspnea    Frequent urination    GERD (gastroesophageal reflux disease)    Glaucoma    HLD (hyperlipidemia)    Hypertension    IBS (irritable bowel syndrome)    Joint pain    Lactose intolerance    Leg edema    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 benign thymoma   Obesity    Palpitations    Pneumonia 05/2016   double   Pulmonary edema     Pulmonary embolism ([place])    a. 2008 -> coumadin x 6 mos.   Rheumatoid arthritis ([place])    Sleep apnea    on CPAP 02/2018   TIA (transient ischemic attack)    Urinary urgency     Patient Active Problem List   Diagnosis Date Noted   Unilateral primary osteoarthritis, left hip    Hip arthritis 06/11/2018   Class 3 severe obesity with serious comorbidity and body mass index (BMI) of 40.0 to 44.9 in adult [facility]) 05/29/2018   Other fatigue 11/20/2017   Shortness of breath on exertion 11/20/2017   Type 2 diabetes mellitus without complication, without long-term current use of insulin ([place]) 11/20/2017   Vitamin D deficiency 11/20/2017   Depression 11/20/2017   Other hyperlipidemia 11/20/2017   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/04/2016   Presence of left artificial knee joint 09/20/2016   Arthritis of knee 08/22/2016   Cervical radiculopathy 07/26/2016   Cervical disc disorder with radiculopathy 07/26/2016   Right arm pain 06/29/2016   Cervicalgia 06/29/2016   Primary osteoarthritis of left knee 06/29/2016   Hypersomnia with sleep apnea 11/18/2015   Lethargy 11/18/2015   Chest pain 09/30/2014   Abnormal cardiac function  test 09/29/2014   Chest pain with moderate risk for cardiac etiology 09/28/2014   HTN (hypertension) 09/28/2014   Hypokalemia    Paresthesia 06/18/2014   Numbness and tingling of left arm and leg    Unstable angina ([place]) 05/24/2014   OSA on CPAP 05/24/2014   Diabetes mellitus type 2 in obese ([place]) 10/29/2012   S/P laparoscopic appendectomy 53/97/6734   Diastolic CHF ([place]) 19/37/9024   Mediastinal mass 01/09/2012   SOB (shortness of breath) 06/20/2011   Mediastinal abnormality 06/20/2011   Dyslipidemia 12/23/2009   GLAUCOMA 12/23/2009   ARTHRITIS 12/23/2009   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   Morbid obesity-BMI 45 09/13/2006   ANXIETY STATE NOS 09/13/2006   DISORDER, DEPRESSIVE NEC 09/13/2006   CARPAL TUNNEL SYNDROME, MILD  09/13/2006   Unspecified essential hypertension 09/13/2006   IBS 09/13/2006   DEGENERATION, LUMBAR/LUMBOSACRAL DISC 09/13/2006   SYMPTOM, SWELLING/MASS/LUMP IN CHEST 09/13/2006   PULMONARY EMBOLISM, HX OF 09/13/2006    Past Surgical History:  Procedure Laterality Date   ABDOMINAL HYSTERECTOMY  2005   APPENDECTOMY     CARDIAC CATHETERIZATION     Normal   CARDIAC CATHETERIZATION N/A 09/30/2014   Procedure: Left Heart Cath and Coronary Angiography;  Surgeon: Sherren Mocha, MD;  Location: [place] [facility];  Service: Cardiovascular;  Laterality: N/A;   LAPAROSCOPIC APPENDECTOMY N/A 06/03/2012   Procedure: APPENDECTOMY LAPAROSCOPIC;  Surgeon: Stark Klein, MD;  Location: [place];  Service: General;  Laterality: N/A;   Left knee surgery  2008   LEG SURGERY     TONSILLECTOMY     TOTAL HIP ARTHROPLASTY Left 06/11/2018   Procedure: LEFT TOTAL HIP ARTHROPLASTY ANTERIOR APPROACH;  Surgeon: Meredith Pel, MD;  Location: [place];  Service: Orthopedics;  Laterality: Left;   TOTAL KNEE ARTHROPLASTY Left 08/22/2016   Procedure: TOTAL KNEE ARTHROPLASTY;  Surgeon: Meredith Pel, MD;  Location: [place];  Service: Orthopedics;  Laterality: Left;   TUBAL LIGATION  1989     OB History    Gravida  3   Para      Term      Preterm      AB      Living        SAB      TAB      Ectopic      Multiple      Live Births               Home Medications    Prior to Admission medications   Medication Sig Start Date End Date Taking? Authorizing Provider  acetaminophen (TYLENOL) 325 MG tablet Take 1-2 tablets (325-650 mg total) by mouth every 6 (six) hours as needed for mild pain (pain score 1-3 or temp > 100.5). 06/14/18   Meredith Pel, MD  albuterol (PROVENTIL HFA;VENTOLIN HFA) 108 (90 Base) MCG/ACT inhaler Inhale 2 puffs into the lungs every 6 (six) hours as needed for wheezing or shortness of breath.    [provider]  amLODipine (NORVASC) 5 MG tablet  Take 5 mg by mouth daily. 06/08/17   [provider]  atorvastatin (LIPITOR) 40 MG tablet Take 40 mg by mouth at bedtime.     [provider]  benzonatate (TESSALON PERLES) 100 MG capsule Take 1 capsule (100 mg total) by mouth 3 (three) times daily as needed for cough (cough). 04/28/18   Muthersbaugh, Jarrett Soho, PA-C  carvedilol (COREG CR) 10 MG 24 hr capsule Take 10 mg by mouth daily.    [provider]  cyclobenzaprine (FLEXERIL) 5 MG tablet Take 1 tablet (5 mg total) by mouth at bedtime. 02/20/18   Maczis, Barth Kirks, PA-C  furosemide (LASIX) 40 MG tablet Take 40 mg by mouth daily.    Rai, Ripudeep K, MD  gabapentin (NEURONTIN) 300 MG capsule Take 2 capsules (600 mg total) by mouth 3 (three) times daily. 05/27/14   Reyne Dumas, MD  isosorbide dinitrate (ISORDIL) 20 MG tablet Take 20 mg by mouth 2 (two) times daily.     [provider]  KOMBIGLYZE XR 5-500 MG TB24 Take 1 tablet by mouth every morning.  10/11/15   [provider]  lidocaine (LIDODERM) 5 % Place 1 patch onto the skin daily. Remove & Discard patch within 12 hours or as directed by MD Patient taking differently: Place 1 patch onto the skin daily as needed (for pain). Remove & Discard patch within 12 hours or as directed by MD 03/06/18   Meredith Pel, MD  lisinopril (PRINIVIL,ZESTRIL) 20 MG tablet Take 20 mg by mouth daily.  09/21/14   [provider]  loperamide (IMODIUM A-D) 2 MG tablet Take 4 mg by mouth 4 (four) times daily as needed for diarrhea or loose stools.     [provider]  meloxicam (MOBIC) 15 MG tablet TAKE 1 TABLET BY MOUTH EVERY DAY X2 WEEKS 08/09/18   Meredith Pel, MD  MYRBETRIQ 25 MG TB24 tablet Take 25 mg by mouth daily. 05/23/17   [provider]  omeprazole (PRILOSEC) 20 MG capsule Take 20 mg by mouth 2 (two) times daily. 10/05/16   [provider]  oxyCODONE (OXY IR/ROXICODONE) 5 MG immediate release tablet Take 1-2 tablets (5-10  mg total) by mouth every 4 (four) hours as needed for moderate pain (pain score 4-6). 06/14/18   Meredith Pel, MD  oxyCODONE (ROXICODONE) 5 MG immediate release tablet 1 po q 8 hrs prn pain 07/22/18   Meredith Pel, MD  potassium chloride (KLOR-CON M10) 10 MEQ tablet Take 1 tablet (10 mEq total) by mouth 2 (two) times daily. 05/27/18   Georgia Lopes, DO  rivaroxaban (XARELTO) 10 MG TABS tablet Take 1 tablet (10 mg total) by mouth daily with breakfast. 06/14/18   Meredith Pel, MD  sertraline (ZOLOFT) 100 MG tablet Take 100 mg by mouth daily.     [provider]  Vitamin D, Ergocalciferol, (DRISDOL) 1.25 MG (50000 UT) CAPS capsule Take 1 capsule (50,000 Units total) by mouth every 7 (seven) days. 07/08/18   Abby Potash, PA-C    Family History Family History  Problem Relation Age of Onset   Emphysema Mother    Arthritis Mother    Heart failure Mother        alive @ 54   Stroke Mother    Diabetes Mother    Hypertension Mother    Hyperlipidemia Mother    Depression Mother    Anxiety disorder Mother    Asthma Brother    Heart disease Father        died @ 74's.   Stroke Father    Diabetes Father    Hyperlipidemia Father    Hypertension Father    Bipolar disorder Father    Sleep apnea Father    Alcoholism Father    Drug abuse Father    Diabetes Sister    Heart disease Paternal Grandfather    Colon cancer Maternal Grandfather    Breast cancer Maternal Aunt     Social History Social  History   Tobacco Use   Smoking status: Former Smoker    Packs/day: 0.50    Years: 15.00    Pack years: 7.50    Types: Cigarettes    Last attempt to quit: 04/24/1985    Years since quitting: 33.3   Smokeless tobacco: Never Used  Substance Use Topics   Alcohol use: No    Alcohol/week: 0.0 standard drinks   Drug use: Not Currently    Types: Marijuana     Allergies   Penicillins; Hydrocodone-acetaminophen; Latex; and Sulfa  antibiotics   Review of Systems Review of Systems  Constitutional: Negative for fever.  Respiratory: Positive for shortness of breath. Negative for cough.   Cardiovascular: Positive for chest pain.  Gastrointestinal: Negative for abdominal pain and vomiting.  All other systems reviewed and are negative.    Physical Exam Updated Vital Signs BP 132/68 (BP Location: Left Arm)    Pulse 88    Temp 97.6 F (36.4 C) (Oral)    Resp 16    SpO2 99%   Physical Exam CONSTITUTIONAL: Well developed/well nourished HEAD: Normocephalic/atraumatic EYES: EOMI/PERRL ENMT: Mucous membranes moist NECK: supple no meningeal signs SPINE/BACK:entire spine nontender CV: S1/S2 noted, no murmurs/rubs/gallops noted LUNGS: Lungs are clear to auscultation bilaterally, no apparent distress Chest-mild tenderness to central chest ABDOMEN: soft, nontender, no rebound or guarding, bowel sounds noted throughout abdomen GU:no cva tenderness NEURO: Pt is awake/alert/appropriate, moves all extremitiesx4.  No facial droop.   EXTREMITIES: pulses normal/equal, full ROM, no lower extremity edema SKIN: warm, color normal PSYCH: Mildly anxious   ED Treatments / Results  Labs (all labs ordered are listed, but only abnormal results are displayed) Labs Reviewed  BASIC METABOLIC PANEL - Abnormal; Notable for the following components:      Result Value   Glucose, Bld 109 (*)    All other components within normal limits  CBC - Abnormal; Notable for the following components:   Hemoglobin 11.4 (*)    All other components within normal limits  TROPONIN I - Abnormal; Notable for the following components:   Troponin I 0.07 (*)    All other components within normal limits  D-DIMER, QUANTITATIVE (NOT AT [facility]) - Abnormal; Notable for the following components:   D-Dimer, Quant 1.47 (*)    All other components within normal limits  BRAIN NATRIURETIC PEPTIDE    EKG EKG Interpretation  Date/Time:  Monday Sep 02 2018  04:40:41 EDT Ventricular Rate:  72 PR Interval:    QRS Duration: 120 QT Interval:  415 QTC Calculation: 455 R Axis:   65 Text Interpretation:  Sinus rhythm Nonspecific intraventricular conduction delay Probable anteroseptal infarct, old Nonspecific T abnormalities, lateral leads Confirmed by Ripley Fraise [phone]) on 09/02/2018 5:32:55 AM   Radiology Dg Chest 2 View  Result Date: 09/02/2018 CLINICAL DATA:  58 year old female with central chest pain, mild shortness of breath, pain radiating to the back. EXAM: CHEST - 2 VIEW COMPARISON:  04/27/2018 and earlier. FINDINGS: Stable cardiomegaly and mediastinal contours. Stable lung volumes. Stable pulmonary vascularity over this series of exams. No pleural effusion, pneumothorax or acute pulmonary opacity. Visualized tracheal air column is within normal limits. No acute osseous abnormality identified. Negative visible bowel gas pattern. IMPRESSION: Stable cardiomegaly and pulmonary vascularity without overt edema or acute cardiopulmonary abnormality. Electronically Signed   By: Genevie Ann M.D.   On: 09/02/2018 05:15   Ct Angio Chest Pe W And/or Wo Contrast  Result Date: 09/02/2018 CLINICAL DATA:  58 year old female with sharp central chest  pain radiating into the back EXAM: CT ANGIOGRAPHY CHEST WITH CONTRAST TECHNIQUE: Multidetector CT imaging of the chest was performed using the standard protocol during bolus administration of intravenous contrast. Multiplanar CT image reconstructions and MIPs were obtained to evaluate the vascular anatomy. CONTRAST:  156mL OMNIPAQUE IOHEXOL 350 MG/ML SOLN COMPARISON:  Chest x-ray obtained earlier today, 09/02/2018; prior CT scan of the chest 09/30/2016 FINDINGS: Cardiovascular: Adequate opacification of the pulmonary arteries to the proximal segmental level. No evidence of central filling defect to suggest acute pulmonary embolus. The main pulmonary artery is mildly enlarged at 3.1 cm. Two vessel aortic arch configuration.  The right brachiocephalic and left common carotid artery share a common origin. No evidence of aortic aneurysm. The heart is mildly enlarged in the left ventricle dilated. No pericardial effusion. Mediastinum/Nodes: Bulky, globular enlargement of the right thyroid gland which extends inferior to the clavicle into the upper mediastinum consistent with goiter. The appearance is similar compared to the prior CT scan from 09/30/2016. Additionally, a smoothly marginated soft tissue mass in the prevascular space posterior to the manubrium is present and measures approximately 2.6 x 1.5 cm, minimally changed compared to 09/30/2016. Comparison with more remote prior imaging from 2016 also demonstrates similar findings. The esophagus is unremarkable. Lungs/Pleura: Moderate respiratory motion artifact. Scattered areas of ground-glass attenuation airspace opacity are favored to reflect subsegmental atelectasis in the setting of hypoventilation. No pulmonary edema, pleural effusion or pneumothorax. Upper Abdomen: No acute abnormality within the upper abdomen. Musculoskeletal: No acute fracture or aggressive appearing lytic or blastic osseous lesion. Review of the MIP images confirms the above findings. IMPRESSION: 1. Negative for acute pulmonary embolism. 2. Cardiomegaly with left ventricular dilatation. The degree of dilatation appears slightly increased compared to 09/30/2016. 3. Hypoventilation with scattered areas of subsegmental atelectasis throughout the lower lobes. 4. Mildly enlarged main pulmonary artery at 3.1 cm raising the possibility of underlying pulmonary arterial hypertension. 5. Right-sided thyroid goiter remains unchanged. 6. Stable anterior mediastinal adenopathy over multiple prior studies. As previously noted, this likely represents a benign process. Electronically Signed   By: Jacqulynn Cadet M.D.   On: 09/02/2018 07:48    Procedures Procedures  Medications Ordered in ED Medications  sodium  chloride flush (NS) 0.9 % injection 3 mL (3 mLs Intravenous Not Given 09/02/18 0447)  fentaNYL (SUBLIMAZE) injection 100 mcg (100 mcg Intravenous Given 09/02/18 0552)     Initial Impression / Assessment and Plan / ED Course  I have reviewed the triage vital signs and the nursing notes.  Pertinent labs & imaging results that were available during my care of the patient were reviewed by me and considered in my medical decision making (see chart for details).  Clinical Course as of Sep 01 2348  Mon Sep 02, 2018  0753 Patient CT resulting in showing no evidence of PE.  Does show some other findings which I will include in her discharge.  I checked on the patient she said she is feeling okay.  She understands the need to follow-up with her doctor and return if any worsening symptoms.   [MB]    Clinical Course User Index [MB] Hayden Rasmussen, MD       6:02 AM Patient history of CHF, previous PE presents with chest pain.  Of note she has had negative heart cath before, last cardiac cath 2016 revealed normal coronaries Since her PE was over 10 years ago, she has had 6 CT chest since 2015 to evaluate for PE Imaging/labs are pending at  this time 6:51 AM D-dimer elevated >1 Pt still with pleuritic CP Will proceed with CT imaging Troponin elevated but close to baseline Low suspicion for ACS given previous negative cardiac cath  D/w dr butler at signout, if CT chest negative she can be discharged home Final Clinical Impressions(s) / ED Diagnoses   Final diagnoses:  Pleurisy    ED Discharge Orders    None       Ripley Fraise, MD 09/02/18 1820    Ripley Fraise, MD 09/02/18 2350

## 2018-09-02 NOTE — ED Notes (Signed)
Patient currently in CT °

## 2018-09-04 ENCOUNTER — Other Ambulatory Visit (INDEPENDENT_AMBULATORY_CARE_PROVIDER_SITE_OTHER): Payer: Self-pay | Admitting: Orthopedic Surgery

## 2018-09-04 NOTE — Telephone Encounter (Signed)
Ok to rf? 

## 2018-09-04 NOTE — Telephone Encounter (Signed)
y

## 2018-09-22 ENCOUNTER — Other Ambulatory Visit (INDEPENDENT_AMBULATORY_CARE_PROVIDER_SITE_OTHER): Payer: Self-pay | Admitting: Family Medicine

## 2018-09-22 ENCOUNTER — Other Ambulatory Visit (INDEPENDENT_AMBULATORY_CARE_PROVIDER_SITE_OTHER): Payer: Self-pay | Admitting: Orthopedic Surgery

## 2018-09-22 DIAGNOSIS — E559 Vitamin D deficiency, unspecified: Secondary | ICD-10-CM

## 2018-10-14 ENCOUNTER — Other Ambulatory Visit (INDEPENDENT_AMBULATORY_CARE_PROVIDER_SITE_OTHER): Payer: Self-pay | Admitting: Family Medicine

## 2018-10-14 DIAGNOSIS — E559 Vitamin D deficiency, unspecified: Secondary | ICD-10-CM

## 2018-10-20 ENCOUNTER — Other Ambulatory Visit (INDEPENDENT_AMBULATORY_CARE_PROVIDER_SITE_OTHER): Payer: Self-pay | Admitting: Orthopedic Surgery

## 2018-10-21 NOTE — Telephone Encounter (Signed)
y

## 2018-10-21 NOTE — Telephone Encounter (Signed)
Ok to rf? 

## 2018-10-23 ENCOUNTER — Other Ambulatory Visit: Payer: Self-pay

## 2018-10-23 ENCOUNTER — Ambulatory Visit (INDEPENDENT_AMBULATORY_CARE_PROVIDER_SITE_OTHER): Payer: Medicare Other

## 2018-10-23 ENCOUNTER — Encounter: Payer: Self-pay | Admitting: Orthopedic Surgery

## 2018-10-23 ENCOUNTER — Ambulatory Visit (INDEPENDENT_AMBULATORY_CARE_PROVIDER_SITE_OTHER): Payer: Medicare Other | Admitting: Orthopedic Surgery

## 2018-10-23 DIAGNOSIS — Z96642 Presence of left artificial hip joint: Secondary | ICD-10-CM

## 2018-10-23 NOTE — Progress Notes (Signed)
Office Visit Note   Patient: [name]           Date of Birth: [birth year]           MRN: [id number] Visit Date: 10/23/2018              Requested by: Tsosie Billing, MD [place],  [place] [address] PCP: Tsosie Billing, MD   Assessment & Plan: Visit Diagnoses:  1. Status post total replacement of left hip     Plan: Pt complains of Lt groin pain since her fall.  Her XR's in clinic show a potential small fracture of the pelvis, superolateral to the cup, that is responsible for her pain.  The implant appears stable as it hasn't changed position when compared to her prior XR's and she has no pain with ambulation.  Her "stinging" pain is likely from her THA incision or may be coming from her back, with a history of sciatica.  Pt will follow-up in 6 weeks for re-check, but her pain should improve with time.  Pt will return sooner if pain worsens.    Follow-Up Instructions: No follow-ups on file.   Orders:  Orders Placed This Encounter  Procedures  . XR HIP UNILAT W OR W/O PELVIS 2-3 VIEWS LEFT   No orders of the defined types were placed in this encounter.     Procedures: No procedures performed   Clinical Data: No additional findings.   Subjective: Chief Complaint  Patient presents with  . Left Hip - Follow-up    Pt is a 58 y.o. Female who presents to the office complaining of Lt hip pain.  She reports a ground-level fall about one week ago, where she became dizzy while turning and fell to her knees.  She denies any knee pain but states that she has had worsened Lt hip pain since the incident.  She localizes the pain to the Lt groin.  The pain comes and goes and does not worsen with ambulation.  She takes Mobic BID to ease her pain.    She also reports stinging pain along the lateral side of her Lt hip along with LBP that radiates down the back of her buttocks into her Lt foot.  She has a hx of sciatica that was successfully treated  with ESI's; last ESI was >1.5 years ago  ROS: All systems reviewed are negative as they relate to the chief complaint within the history of present illness.  Patient denies fevers or chills.    Objective: Vital Signs: There were no vitals taken for this visit.    Constitutional: Patient appears well-developed HEENT:  Head: Normocephalic Eyes:EOM are normal Neck: Normal range of motion Cardiovascular: Normal rate Pulmonary/chest: Effort normal Neurologic: Patient is alert Skin: Skin is warm Psychiatric: Patient has normal mood and affect   Ortho Exam: Lt hip:  Well-healed incision from anterior THA, no evidence of any wound problems Groin pain with IR of Lt hip No pain with Flexion-Extension passive ROM of hip Groin pain with Stinchfield test Negative SLR of LLE  Specialty Comments:  No specialty comments available.  Imaging: No results found.   PMFS History: Patient Active Problem List   Diagnosis Date Noted  . Unilateral primary osteoarthritis, left hip   . Hip arthritis 06/11/2018  . Class 3 severe obesity with serious comorbidity and body mass index (BMI) of 40.0 to 44.9 in adult ([place]) 05/29/2018  . Other fatigue 11/20/2017  . Shortness of breath on  exertion 11/20/2017  . Type 2 diabetes mellitus without complication, without long-term current use of insulin ([place]) 11/20/2017  . Vitamin D deficiency 11/20/2017  . Depression 11/20/2017  . Other hyperlipidemia 11/20/2017  . 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 Presence of left artificial knee joint 09/20/2016  . Arthritis of knee 08/22/2016  . Cervical radiculopathy 07/26/2016  . Cervical disc disorder with radiculopathy 07/26/2016  . Right arm pain 06/29/2016  . Cervicalgia 06/29/2016  . Primary osteoarthritis of left knee 06/29/2016  . Hypersomnia with sleep apnea 11/18/2015  . Lethargy 11/18/2015  . Chest pain 09/30/2014  . Abnormal cardiac function test 09/29/2014  . Chest pain with moderate risk  for cardiac etiology 09/28/2014  . HTN (hypertension) 09/28/2014  . Hypokalemia   . Paresthesia 06/18/2014  . Numbness and tingling of left arm and leg   . Unstable angina ([place]) 05/24/2014  . OSA on CPAP 05/24/2014  . Diabetes mellitus type 2 in obese ([place]) 10/29/2012  . S/P laparoscopic appendectomy 06/04/2012  . Diastolic CHF ([place]) 58/85/0277  . Mediastinal mass 01/09/2012  . SOB (shortness of breath) 06/20/2011  . Mediastinal abnormality 06/20/2011  . Dyslipidemia 12/23/2009  . GLAUCOMA 12/23/2009  . ARTHRITIS 12/23/2009  . 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 Morbid obesity-BMI 45 09/13/2006  . ANXIETY STATE NOS 09/13/2006  . DISORDER, DEPRESSIVE NEC 09/13/2006  . CARPAL TUNNEL SYNDROME, MILD 09/13/2006  . Unspecified essential hypertension 09/13/2006  . IBS 09/13/2006  . DEGENERATION, LUMBAR/LUMBOSACRAL DISC 09/13/2006  . SYMPTOM, SWELLING/MASS/LUMP IN CHEST 09/13/2006  . PULMONARY EMBOLISM, HX OF 09/13/2006   Past Medical History:  Diagnosis Date  . Anginal pain ([place])    a. NL cath in 2008;  b. Myoview 03/2011: dec uptake along mid anterior wall on stress imaging -> ? attenuation vs. ischemia, EF 65%;  c. Echo 04/2011: EF 55-60%, no RWMA, Gr 2 dd  . Anxiety   . Arthritis   . Asthma   . Back pain   . Bone cancer ([place])   . Cancer ([place])   . Chest pain   . CHF (congestive heart failure) ([place])   . CHF (congestive heart failure) ([place])   . Depression   . Diabetes mellitus   . Drug use   . Dyspnea    with exertion  . Dyspnea   . Frequent urination   . GERD (gastroesophageal reflux disease)   . Glaucoma   . HLD (hyperlipidemia)   . Hypertension   . IBS (irritable bowel syndrome)   . Joint pain   . Lactose intolerance   . Leg edema   . 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 Obesity   . Palpitations   . Pneumonia 05/2016   double  . Pulmonary edema   . Pulmonary embolism ([place])    a. 2008 -> coumadin x 6 mos.  . Rheumatoid arthritis ([place])   . Sleep  apnea    on CPAP 02/2018  . TIA (transient ischemic attack)   . Urinary urgency     Family History  Problem Relation Age of Onset  . Emphysema Mother   . Arthritis Mother   . Heart failure Mother        alive @ 8  . Stroke Mother   . Diabetes Mother   . Hypertension Mother   . Hyperlipidemia Mother   . Depression Mother   . Anxiety disorder Mother   . Asthma Brother   . Heart disease Father  died @ 60's.  . Stroke Father   . Diabetes Father   . Hyperlipidemia Father   . Hypertension Father   . Bipolar disorder Father   . Sleep apnea Father   . Alcoholism Father   . Drug abuse Father   . Diabetes Sister   . Heart disease Paternal Grandfather   . Colon cancer Maternal Grandfather   . Breast cancer Maternal Aunt     Past Surgical History:  Procedure Laterality Date  . ABDOMINAL HYSTERECTOMY  2005  . APPENDECTOMY    . CARDIAC CATHETERIZATION     Normal  . CARDIAC CATHETERIZATION N/A 09/30/2014   Procedure: Left Heart Cath and Coronary Angiography;  Surgeon: Sherren Mocha, MD;  Location: [facility];  Service: Cardiovascular;  Laterality: N/A;  . LAPAROSCOPIC APPENDECTOMY N/A 06/03/2012   Procedure: APPENDECTOMY LAPAROSCOPIC;  Surgeon: Stark Klein, MD;  Location: [facility];  Service: General;  Laterality: N/A;  . Left knee surgery  2008  . LEG SURGERY    . TONSILLECTOMY    . TOTAL HIP ARTHROPLASTY Left 06/11/2018   Procedure: LEFT TOTAL HIP ARTHROPLASTY ANTERIOR APPROACH;  Surgeon: Meredith Pel, MD;  Location: [facility];  Service: Orthopedics;  Laterality: Left;  . TOTAL KNEE ARTHROPLASTY Left 08/22/2016   Procedure: TOTAL KNEE ARTHROPLASTY;  Surgeon: Meredith Pel, MD;  Location: [facility];  Service: Orthopedics;  Laterality: Left;  . TUBAL LIGATION  1989   Social History   Occupational History  . Occupation: UNEMPLOYED    Employer: DISABLED  Tobacco Use  . Smoking status: Former Smoker    Packs/day: 0.50    Years: 15.00    Pack years: 7.50     Types: Cigarettes    Quit date: 04/24/1985    Years since quitting: 33.5  . Smokeless tobacco: Never Used  Substance and Sexual Activity  . Alcohol use: No    Alcohol/week: 0.0 standard drinks  . Drug use: Not Currently    Types: Marijuana  . Sexual activity: Never

## 2018-10-24 ENCOUNTER — Encounter: Payer: Self-pay | Admitting: Orthopedic Surgery

## 2018-10-29 ENCOUNTER — Telehealth (INDEPENDENT_AMBULATORY_CARE_PROVIDER_SITE_OTHER): Payer: Medicare Other | Admitting: Bariatrics

## 2018-10-29 ENCOUNTER — Encounter (INDEPENDENT_AMBULATORY_CARE_PROVIDER_SITE_OTHER): Payer: Self-pay | Admitting: Bariatrics

## 2018-10-29 ENCOUNTER — Other Ambulatory Visit: Payer: Self-pay

## 2018-10-29 DIAGNOSIS — E559 Vitamin D deficiency, unspecified: Secondary | ICD-10-CM | POA: Diagnosis not present

## 2018-10-29 DIAGNOSIS — E119 Type 2 diabetes mellitus without complications: Secondary | ICD-10-CM | POA: Diagnosis not present

## 2018-10-29 DIAGNOSIS — Z6841 Body Mass Index (BMI) 40.0 and over, adult: Secondary | ICD-10-CM | POA: Diagnosis not present

## 2018-10-29 MED ORDER — VITAMIN D (ERGOCALCIFEROL) 1.25 MG (50000 UNIT) PO CAPS: 50000.0000 [IU] | ORAL_CAPSULE | ORAL | 0 refills | Status: DC

## 2018-10-30 NOTE — Progress Notes (Signed)
Office: [phone]  /  Fax: [phone] TeleHealth Visit:  [name] has verbally consented to this TeleHealth visit today. The patient is located at home, the provider is located at the [facility] office. The participants in this visit include the listed provider and patient and any and all parties involved. The visit was conducted today via telephone. [name] was unable to use realtime audiovisual technology today and the telehealth visit was conducted via telephone.  HPI:   Chief Complaint: OBESITY [name] is here to discuss her progress with her obesity treatment plan. She is on the Category 2 plan and is following her eating plan approximately 80 % of the time. She states she is walking the dog 15 minutes 3 times per week. [name] was seen by me, last in February 2020. She was seen by Abby Potash, PA-C in March of this year. [name] has gained 6 pounds. She is back on the Category 2 plan. We were unable to weigh the patient today for this TeleHealth visit. She feels as if she has gained weight since her last visit. She has lost 17 lbs since starting treatment with [name].  Diabetes II [name] has a diagnosis of diabetes type II. She takes Kombiglyze. [name] states fasting BGs range between 80 and 130's and when blood sugar is below 90, she feels shaky. Last A1c was at 6.6 She has been working on intensive lifestyle modifications including diet, exercise, and weight loss to help control her blood glucose levels.  Vitamin D deficiency [name] has a diagnosis of vitamin D deficiency. She is currently taking vit D and denies nausea, vomiting or muscle weakness.  ASSESSMENT AND PLAN:  Type 2 diabetes mellitus without complication, without long-term current use of insulin (HCC)  Vitamin D deficiency - Plan: Vitamin D, Ergocalciferol, (DRISDOL) 1.25 MG (50000 UT) CAPS capsule  Class 3 severe obesity with serious comorbidity and body mass index (BMI) of 40.0 to 44.9 in  adult, unspecified obesity type ([place])  PLAN:  Diabetes II [name] has been given extensive diabetes education by myself today including ideal fasting and post-prandial blood glucose readings, individual ideal Hgb A1c goals and hypoglycemia prevention. We discussed the importance of good blood sugar control to decrease the likelihood of diabetic complications such as nephropathy, neuropathy, limb loss, blindness, coronary artery disease, and death. We discussed the importance of intensive lifestyle modification including diet, exercise and weight loss as the first line treatment for diabetes. Lynnsie will continue her diabetes medications and she will get glucose tablets if needed. Daphne will follow up at the agreed upon time.  Vitamin D Deficiency [name] was informed that low vitamin D levels contributes to fatigue and are associated with obesity, breast, and colon cancer. She agrees to continue to take prescription Vit D @50 ,000 IU every week #4 with no refills and will follow up for routine testing of vitamin D, at least 2-3 times per year. She was informed of the risk of over-replacement of vitamin D and agrees to not increase her dose unless she discusses this with [name] first. Miyeko agrees to follow up as directed.  Obesity [name] is currently in the action stage of change. As such, her goal is to continue with weight loss efforts She has agreed to follow the Category 2 plan [name] has been instructed to work up to a goal of 150 minutes of combined cardio and strengthening exercise per week for weight loss and overall health benefits. We discussed the following Behavioral Modification Strategies today:  increase H2O intake (no more 2 liters, ask cardiologist fluid limit), no skipping meals, keeping healthy foods in the home, increasing lean protein intake, decreasing simple carbohydrates , increasing vegetables, decrease eating out and work on meal planning and intentional eating  [name] has agreed  to follow up with our clinic in 2 weeks. She was informed of the importance of frequent follow up visits to maximize her success with intensive lifestyle modifications for her multiple health conditions.  ALLERGIES: Allergies  Allergen Reactions  . Penicillins Swelling and Other (See Comments)    Has patient had a PCN reaction causing immediate rash, facial/tongue/throat swelling, SOB or lightheadedness with hypotension: YES Has patient had a PCN reaction causing severe rash involving mucus membranes or skin necrosis: NO Has patient had a PCN reaction that required hospitalization NO Has patient had a PCN reaction occurring within the last 10 years: NO If all of the above answers are "NO", then may proceed with Cephalosporin use.  [name] Hydrocodone-Acetaminophen Itching    confusion  . Latex Rash  . Sulfa Antibiotics Diarrhea, Itching and Rash    MEDICATIONS: Current Outpatient Medications on File Prior to Visit  Medication Sig Dispense Refill  . acetaminophen (TYLENOL) 325 MG tablet Take 1-2 tablets (325-650 mg total) by mouth every 6 (six) hours as needed for mild pain (pain score 1-3 or temp > 100.5). 60 tablet 0  . albuterol (PROVENTIL HFA;VENTOLIN HFA) 108 (90 Base) MCG/ACT inhaler Inhale 2 puffs into the lungs every 6 (six) hours as needed for wheezing or shortness of breath.    [name] amLODipine (NORVASC) 5 MG tablet Take 5 mg by mouth daily.  6  . atorvastatin (LIPITOR) 40 MG tablet Take 40 mg by mouth at bedtime.     . carvedilol (COREG CR) 10 MG 24 hr capsule Take 10 mg by mouth daily.    . furosemide (LASIX) 40 MG tablet Take 40 mg by mouth daily.    . isosorbide dinitrate (ISORDIL) 20 MG tablet Take 20 mg by mouth 2 (two) times daily.     [name] KOMBIGLYZE XR 5-500 MG TB24 Take 1 tablet by mouth every morning.     . lidocaine (LIDODERM) 5 % Place 1 patch onto the skin daily. Remove & Discard patch within 12 hours or as directed by MD (Patient taking differently: Place 1 patch onto the skin  daily as needed (for pain). Remove & Discard patch within 12 hours or as directed by MD) 30 patch 0  . lisinopril (PRINIVIL,ZESTRIL) 20 MG tablet Take 20 mg by mouth daily.     [name] loperamide (IMODIUM A-D) 2 MG tablet Take 4 mg by mouth 4 (four) times daily as needed for diarrhea or loose stools.     . meloxicam (MOBIC) 15 MG tablet TAKE 1 TABLET BY MOUTH EVERY DAY X2 WEEKS 30 tablet 0  . MYRBETRIQ 25 MG TB24 tablet Take 25 mg by mouth daily.  5  . omeprazole (PRILOSEC) 20 MG capsule Take 20 mg by mouth 2 (two) times daily.    [name] oxyCODONE-acetaminophen (PERCOCET/ROXICET) 5-325 MG tablet Take 2 tablets by mouth every 6 (six) hours as needed for severe pain. 12 tablet 0  . potassium chloride (KLOR-CON M10) 10 MEQ tablet Take 1 tablet (10 mEq total) by mouth 2 (two) times daily. 60 tablet 1  . sertraline (ZOLOFT) 100 MG tablet Take 100 mg by mouth daily.      No current facility-administered medications on file prior to visit.     PAST  MEDICAL HISTORY: Past Medical History:  Diagnosis Date  . Anginal pain ([place])    a. NL cath in 2008;  b. Myoview 03/2011: dec uptake along mid anterior wall on stress imaging -> ? attenuation vs. ischemia, EF 65%;  c. Echo 04/2011: EF 55-60%, no RWMA, Gr 2 dd  . Anxiety   . Arthritis   . Asthma   . Back pain   . Bone cancer ([place])   . Cancer ([place])   . Chest pain   . CHF (congestive heart failure) ([place])   . CHF (congestive heart failure) ([place])   . Depression   . Diabetes mellitus   . Drug use   . Dyspnea    with exertion  . Dyspnea   . Frequent urination   . GERD (gastroesophageal reflux disease)   . Glaucoma   . HLD (hyperlipidemia)   . Hypertension   . IBS (irritable bowel syndrome)   . Joint pain   . Lactose intolerance   . Leg edema   . 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 Obesity   . Palpitations   . Pneumonia 05/2016   double  . Pulmonary edema   . Pulmonary embolism ([place])    a. 2008 -> coumadin x 6 mos.  . Rheumatoid  arthritis ([place])   . Sleep apnea    on CPAP 02/2018  . TIA (transient ischemic attack)   . Urinary urgency     PAST SURGICAL HISTORY: Past Surgical History:  Procedure Laterality Date  . ABDOMINAL HYSTERECTOMY  2005  . APPENDECTOMY    . CARDIAC CATHETERIZATION     Normal  . CARDIAC CATHETERIZATION N/A 09/30/2014   Procedure: Left Heart Cath and Coronary Angiography;  Surgeon: Sherren Mocha, MD;  Location: [facility];  Service: Cardiovascular;  Laterality: N/A;  . LAPAROSCOPIC APPENDECTOMY N/A 06/03/2012   Procedure: APPENDECTOMY LAPAROSCOPIC;  Surgeon: Stark Klein, MD;  Location: [facility];  Service: General;  Laterality: N/A;  . Left knee surgery  2008  . LEG SURGERY    . TONSILLECTOMY    . TOTAL HIP ARTHROPLASTY Left 06/11/2018   Procedure: LEFT TOTAL HIP ARTHROPLASTY ANTERIOR APPROACH;  Surgeon: Meredith Pel, MD;  Location: [facility];  Service: Orthopedics;  Laterality: Left;  . TOTAL KNEE ARTHROPLASTY Left 08/22/2016   Procedure: TOTAL KNEE ARTHROPLASTY;  Surgeon: Meredith Pel, MD;  Location: [facility];  Service: Orthopedics;  Laterality: Left;  . TUBAL LIGATION  1989    SOCIAL HISTORY: Social History   Tobacco Use  . Smoking status: Former Smoker    Packs/day: 0.50    Years: 15.00    Pack years: 7.50    Types: Cigarettes    Quit date: 04/24/1985    Years since quitting: 33.5  . Smokeless tobacco: Never Used  Substance Use Topics  . Alcohol use: No    Alcohol/week: 0.0 standard drinks  . Drug use: Not Currently    Types: Marijuana    FAMILY HISTORY: Family History  Problem Relation Age of Onset  . Emphysema Mother   . Arthritis Mother   . Heart failure Mother        alive @ 65  . Stroke Mother   . Diabetes Mother   . Hypertension Mother   . Hyperlipidemia Mother   . Depression Mother   . Anxiety disorder Mother   . Asthma Brother   . Heart disease Father        died @  15's.  . Stroke Father   . Diabetes Father   . Hyperlipidemia Father   .  Hypertension Father   . Bipolar disorder Father   . Sleep apnea Father   . Alcoholism Father   . Drug abuse Father   . Diabetes Sister   . Heart disease Paternal Grandfather   . Colon cancer Maternal Grandfather   . Breast cancer Maternal Aunt     ROS: Review of Systems  Constitutional: Negative for weight loss.  Gastrointestinal: Negative for nausea and vomiting.  Musculoskeletal:       Negative for muscle weakness  Endo/Heme/Allergies:       Positive for hypoglycemia    PHYSICAL EXAM: Pt in no acute distress  RECENT LABS AND TESTS: BMET    Component Value Date/Time   NA 142 09/02/2018 0447   NA 144 03/04/2018 1152   K 3.8 09/02/2018 0447   CL 106 09/02/2018 0447   CO2 27 09/02/2018 0447   GLUCOSE 109 (H) 09/02/2018 0447   BUN 16 09/02/2018 0447   BUN 17 03/04/2018 1152   CREATININE 0.94 09/02/2018 0447   CALCIUM 9.5 09/02/2018 0447   GFRNONAA >60 09/02/2018 0447   GFRAA >60 09/02/2018 0447   Lab Results  Component Value Date   HGBA1C 6.6 (H) 05/22/2018   HGBA1C 6.8 (H) 03/04/2018   HGBA1C 8.8 (H) 11/20/2017   HGBA1C 7.4 (H) 08/11/2016   HGBA1C 6.6 (H) 05/21/2016   Lab Results  Component Value Date   INSULIN 20.2 03/04/2018   INSULIN 53.9 (H) 11/20/2017   CBC    Component Value Date/Time   WBC 6.4 09/02/2018 0447   RBC 4.39 09/02/2018 0447   HGB 11.4 (L) 09/02/2018 0447   HGB 12.8 11/20/2017 0925   HCT 37.0 09/02/2018 0447   HCT 40.6 11/20/2017 0925   PLT 245 09/02/2018 0447   MCV 84.3 09/02/2018 0447   MCV 85 11/20/2017 0925   MCH 26.0 09/02/2018 0447   MCHC 30.8 09/02/2018 0447   RDW 13.9 09/02/2018 0447   RDW 13.9 11/20/2017 0925   LYMPHSABS 2.7 06/12/2018 1321   LYMPHSABS 2.1 11/20/2017 0925   MONOABS 0.8 06/12/2018 1321   EOSABS 0.2 06/12/2018 1321   EOSABS 0.4 11/20/2017 0925   BASOSABS 0.0 06/12/2018 1321   BASOSABS 0.0 11/20/2017 0925   Iron/TIBC/Ferritin/ %Sat No results found for: IRON, TIBC, FERRITIN, IRONPCTSAT Lipid Panel      Component Value Date/Time   CHOL 162 03/04/2018 1152   TRIG 117 03/04/2018 1152   HDL 50 03/04/2018 1152   CHOLHDL 4.2 04/23/2011 0830   CHOLHDL 4.3 04/23/2011 0830   VLDL 29 04/23/2011 0830   VLDL 29 04/23/2011 0830   LDLCALC 89 03/04/2018 1152   Hepatic Function Panel     Component Value Date/Time   PROT 6.4 03/04/2018 1152   ALBUMIN 4.1 03/04/2018 1152   AST 19 03/04/2018 1152   ALT 40 (H) 03/04/2018 1152   ALKPHOS 61 03/04/2018 1152   BILITOT 0.4 03/04/2018 1152   BILIDIR 0.1 05/22/2016 0913   IBILI 0.6 05/22/2016 0913      Component Value Date/Time   TSH 0.638 11/20/2017 0925   TSH 0.112 (L) 05/24/2016 0629   TSH 0.345 (L) 06/18/2014 0930   TSH 0.167 (L) 10/28/2012 0144     Ref. Range 03/04/2018 11:52  Vitamin D, 25-Hydroxy Latest Ref Range: 30.0 - 100.0 ng/mL 34.5    I, Doreene Nest, am acting as Location manager for [employer]. Owens Shark, DO  I have  reviewed the above documentation for accuracy and completeness, and I agree with the above. -Jearld Lesch, DO

## 2018-11-12 ENCOUNTER — Telehealth (INDEPENDENT_AMBULATORY_CARE_PROVIDER_SITE_OTHER): Payer: Medicare Other | Admitting: Bariatrics

## 2018-11-12 ENCOUNTER — Encounter (INDEPENDENT_AMBULATORY_CARE_PROVIDER_SITE_OTHER): Payer: Self-pay | Admitting: Bariatrics

## 2018-11-12 ENCOUNTER — Other Ambulatory Visit: Payer: Self-pay

## 2018-11-12 DIAGNOSIS — Z6835 Body mass index (BMI) 35.0-35.9, adult: Secondary | ICD-10-CM | POA: Diagnosis not present

## 2018-11-12 DIAGNOSIS — E119 Type 2 diabetes mellitus without complications: Secondary | ICD-10-CM | POA: Diagnosis not present

## 2018-11-12 DIAGNOSIS — E559 Vitamin D deficiency, unspecified: Secondary | ICD-10-CM

## 2018-11-13 NOTE — Progress Notes (Signed)
Office: [phone]  /  Fax: [phone] TeleHealth Visit:  [name] has verbally consented to this TeleHealth visit today. The patient is located at home, the provider is located at the [facility] office. The participants in this visit include the listed provider and patient. Sidrah was unable to use realtime audiovisual technology today and the telehealth visit was conducted via telephone.   HPI:   Chief Complaint: OBESITY Dawson is here to discuss her progress with her obesity treatment plan. She is on the Category 2 plan and is following her eating plan approximately 60-70 % of the time. She states she is walking 2 times for 15 minutes 2 times per week. Katyana states that she has gained 2 lbs (weight of 209 lbs). She has been stressed secondary to the death of her dog. She has been skipping meals. She states her blood pressure has been elevated.  We were unable to weigh the patient today for this TeleHealth visit. She feels as if she has gained 2 lbs since her last visit. She has lost 17 lbs since starting treatment with [name].  Diabetes II Yamira has a diagnosis of diabetes type II. Subrena is taking Kombiglyze. She states her fasting BGs range between 110 and 120, she had 1 high and no lows. She denies hypoglycemia. Last A1c was 6.6. She has been working on intensive lifestyle modifications including diet, exercise, and weight loss to help control her blood glucose levels.  Vitamin D Deficiency Francenia has a diagnosis of vitamin D deficiency. She is currently taking prescription Vit D and denies nausea, vomiting or muscle weakness.  ASSESSMENT AND PLAN:  Type 2 diabetes mellitus without complication, without long-term current use of insulin (HCC)  Vitamin D deficiency  Class 2 severe obesity with serious comorbidity and body mass index (BMI) of 35.0 to 35.9 in adult, unspecified obesity type ([place])  PLAN:  Diabetes II Lubna has been given extensive  diabetes education by myself today including ideal fasting and post-prandial blood glucose readings, individual ideal Hgb A1c goals and hypoglycemia prevention. We discussed the importance of good blood sugar control to decrease the likelihood of diabetic complications such as nephropathy, neuropathy, limb loss, blindness, coronary artery disease, and death. We discussed the importance of intensive lifestyle modification including diet, exercise and weight loss as the first line treatment for diabetes. Mariangel agrees to continue her diabetes medications and she agrees to follow up with our clinic in 2 weeks.  Vitamin D Deficiency Illyanna was informed that low vitamin D levels contributes to fatigue and are associated with obesity, breast, and colon cancer. Olivette agrees to continue taking prescription Vit D 50,000 IU every week and will follow up for routine testing of vitamin D, at least 2-3 times per year. She was informed of the risk of over-replacement of vitamin D and agrees to not increase her dose unless she discusses this with [name] first. Arelis agrees to follow up with our clinic in 2 weeks.  Obesity Jasimine is currently in the action stage of change. As such, her goal is to continue with weight loss efforts She has agreed to follow the Category 2 plan  Wilhelmenia will get back on track. Enya has been instructed to work up to a goal of 150 minutes of combined cardio and strengthening exercise per week or continue current activities for weight loss and overall health benefits. We discussed the following Behavioral Modification Strategies today: increasing lean protein intake, decreasing simple carbohydrates, increasing vegetables, decrease eating  out, increase H20 intake, work on meal planning and easy cooking plans, no skipping meals, keeping healthy foods in the home, and planning for success   Elycia has agreed to follow up with our clinic in 2 weeks. She was informed of the importance of frequent  follow up visits to maximize her success with intensive lifestyle modifications for her multiple health conditions.  ALLERGIES: Allergies  Allergen Reactions  . Penicillins Swelling and Other (See Comments)    Has patient had a PCN reaction causing immediate rash, facial/tongue/throat swelling, SOB or lightheadedness with hypotension: YES Has patient had a PCN reaction causing severe rash involving mucus membranes or skin necrosis: NO Has patient had a PCN reaction that required hospitalization NO Has patient had a PCN reaction occurring within the last 10 years: NO If all of the above answers are "NO", then may proceed with Cephalosporin use.  [name] Hydrocodone-Acetaminophen Itching    confusion  . Latex Rash  . Sulfa Antibiotics Diarrhea, Itching and Rash    MEDICATIONS: Current Outpatient Medications on File Prior to Visit  Medication Sig Dispense Refill  . acetaminophen (TYLENOL) 325 MG tablet Take 1-2 tablets (325-650 mg total) by mouth every 6 (six) hours as needed for mild pain (pain score 1-3 or temp > 100.5). 60 tablet 0  . albuterol (PROVENTIL HFA;VENTOLIN HFA) 108 (90 Base) MCG/ACT inhaler Inhale 2 puffs into the lungs every 6 (six) hours as needed for wheezing or shortness of breath.    [name] amLODipine (NORVASC) 5 MG tablet Take 5 mg by mouth daily.  6  . atorvastatin (LIPITOR) 40 MG tablet Take 40 mg by mouth at bedtime.     . carvedilol (COREG CR) 10 MG 24 hr capsule Take 10 mg by mouth daily.    . furosemide (LASIX) 40 MG tablet Take 40 mg by mouth daily.    . isosorbide dinitrate (ISORDIL) 20 MG tablet Take 20 mg by mouth 2 (two) times daily.     [name] KOMBIGLYZE XR 5-500 MG TB24 Take 1 tablet by mouth every morning.     . lidocaine (LIDODERM) 5 % Place 1 patch onto the skin daily. Remove & Discard patch within 12 hours or as directed by MD (Patient taking differently: Place 1 patch onto the skin daily as needed (for pain). Remove & Discard patch within 12 hours or as directed by MD)  30 patch 0  . lisinopril (PRINIVIL,ZESTRIL) 20 MG tablet Take 20 mg by mouth daily.     [name] loperamide (IMODIUM A-D) 2 MG tablet Take 4 mg by mouth 4 (four) times daily as needed for diarrhea or loose stools.     . meloxicam (MOBIC) 15 MG tablet TAKE 1 TABLET BY MOUTH EVERY DAY X2 WEEKS 30 tablet 0  . MYRBETRIQ 25 MG TB24 tablet Take 25 mg by mouth daily.  5  . omeprazole (PRILOSEC) 20 MG capsule Take 20 mg by mouth 2 (two) times daily.    [name] oxyCODONE-acetaminophen (PERCOCET/ROXICET) 5-325 MG tablet Take 2 tablets by mouth every 6 (six) hours as needed for severe pain. 12 tablet 0  . potassium chloride (KLOR-CON M10) 10 MEQ tablet Take 1 tablet (10 mEq total) by mouth 2 (two) times daily. 60 tablet 1  . sertraline (ZOLOFT) 100 MG tablet Take 100 mg by mouth daily.     . Vitamin D, Ergocalciferol, (DRISDOL) 1.25 MG (50000 UT) CAPS capsule Take 1 capsule (50,000 Units total) by mouth every 7 (seven) days. 4 capsule 0   No current  facility-administered medications on file prior to visit.     PAST MEDICAL HISTORY: Past Medical History:  Diagnosis Date  . Anginal pain ([place])    a. NL cath in 2008;  b. Myoview 03/2011: dec uptake along mid anterior wall on stress imaging -> ? attenuation vs. ischemia, EF 65%;  c. Echo 04/2011: EF 55-60%, no RWMA, Gr 2 dd  . Anxiety   . Arthritis   . Asthma   . Back pain   . Bone cancer ([place])   . Cancer ([place])   . Chest pain   . CHF (congestive heart failure) ([place])   . CHF (congestive heart failure) ([place])   . Depression   . Diabetes mellitus   . Drug use   . Dyspnea    with exertion  . Dyspnea   . Frequent urination   . GERD (gastroesophageal reflux disease)   . Glaucoma   . HLD (hyperlipidemia)   . Hypertension   . IBS (irritable bowel syndrome)   . Joint pain   . Lactose intolerance   . Leg edema   . 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 Obesity   . Palpitations   . Pneumonia 05/2016   double  . Pulmonary edema   . Pulmonary  embolism ([place])    a. 2008 -> coumadin x 6 mos.  . Rheumatoid arthritis ([place])   . Sleep apnea    on CPAP 02/2018  . TIA (transient ischemic attack)   . Urinary urgency     PAST SURGICAL HISTORY: Past Surgical History:  Procedure Laterality Date  . ABDOMINAL HYSTERECTOMY  2005  . APPENDECTOMY    . CARDIAC CATHETERIZATION     Normal  . CARDIAC CATHETERIZATION N/A 09/30/2014   Procedure: Left Heart Cath and Coronary Angiography;  Surgeon: Sherren Mocha, MD;  Location: [facility];  Service: Cardiovascular;  Laterality: N/A;  . LAPAROSCOPIC APPENDECTOMY N/A 06/03/2012   Procedure: APPENDECTOMY LAPAROSCOPIC;  Surgeon: Stark Klein, MD;  Location: [facility];  Service: General;  Laterality: N/A;  . Left knee surgery  2008  . LEG SURGERY    . TONSILLECTOMY    . TOTAL HIP ARTHROPLASTY Left 06/11/2018   Procedure: LEFT TOTAL HIP ARTHROPLASTY ANTERIOR APPROACH;  Surgeon: Meredith Pel, MD;  Location: [facility];  Service: Orthopedics;  Laterality: Left;  . TOTAL KNEE ARTHROPLASTY Left 08/22/2016   Procedure: TOTAL KNEE ARTHROPLASTY;  Surgeon: Meredith Pel, MD;  Location: [facility];  Service: Orthopedics;  Laterality: Left;  . TUBAL LIGATION  1989    SOCIAL HISTORY: Social History   Tobacco Use  . Smoking status: Former Smoker    Packs/day: 0.50    Years: 15.00    Pack years: 7.50    Types: Cigarettes    Quit date: 04/24/1985    Years since quitting: 33.5  . Smokeless tobacco: Never Used  Substance Use Topics  . Alcohol use: No    Alcohol/week: 0.0 standard drinks  . Drug use: Not Currently    Types: Marijuana    FAMILY HISTORY: Family History  Problem Relation Age of Onset  . Emphysema Mother   . Arthritis Mother   . Heart failure Mother        alive @ 76  . Stroke Mother   . Diabetes Mother   . Hypertension Mother   . Hyperlipidemia Mother   . Depression Mother   . Anxiety disorder Mother   . Asthma Brother   .  Heart disease Father        died @ 49's.  . Stroke  Father   . Diabetes Father   . Hyperlipidemia Father   . Hypertension Father   . Bipolar disorder Father   . Sleep apnea Father   . Alcoholism Father   . Drug abuse Father   . Diabetes Sister   . Heart disease Paternal Grandfather   . Colon cancer Maternal Grandfather   . Breast cancer Maternal Aunt     ROS: Review of Systems  Constitutional: Negative for weight loss.  Gastrointestinal: Negative for nausea and vomiting.  Musculoskeletal:       Negative muscle weakness  Endo/Heme/Allergies:       Negative hypoglycemia    PHYSICAL EXAM: Pt in no acute distress  RECENT LABS AND TESTS: BMET    Component Value Date/Time   NA 142 09/02/2018 0447   NA 144 03/04/2018 1152   K 3.8 09/02/2018 0447   CL 106 09/02/2018 0447   CO2 27 09/02/2018 0447   GLUCOSE 109 (H) 09/02/2018 0447   BUN 16 09/02/2018 0447   BUN 17 03/04/2018 1152   CREATININE 0.94 09/02/2018 0447   CALCIUM 9.5 09/02/2018 0447   GFRNONAA >60 09/02/2018 0447   GFRAA >60 09/02/2018 0447   Lab Results  Component Value Date   HGBA1C 6.6 (H) 05/22/2018   HGBA1C 6.8 (H) 03/04/2018   HGBA1C 8.8 (H) 11/20/2017   HGBA1C 7.4 (H) 08/11/2016   HGBA1C 6.6 (H) 05/21/2016   Lab Results  Component Value Date   INSULIN 20.2 03/04/2018   INSULIN 53.9 (H) 11/20/2017   CBC    Component Value Date/Time   WBC 6.4 09/02/2018 0447   RBC 4.39 09/02/2018 0447   HGB 11.4 (L) 09/02/2018 0447   HGB 12.8 11/20/2017 0925   HCT 37.0 09/02/2018 0447   HCT 40.6 11/20/2017 0925   PLT 245 09/02/2018 0447   MCV 84.3 09/02/2018 0447   MCV 85 11/20/2017 0925   MCH 26.0 09/02/2018 0447   MCHC 30.8 09/02/2018 0447   RDW 13.9 09/02/2018 0447   RDW 13.9 11/20/2017 0925   LYMPHSABS 2.7 06/12/2018 1321   LYMPHSABS 2.1 11/20/2017 0925   MONOABS 0.8 06/12/2018 1321   EOSABS 0.2 06/12/2018 1321   EOSABS 0.4 11/20/2017 0925   BASOSABS 0.0 06/12/2018 1321   BASOSABS 0.0 11/20/2017 0925   Iron/TIBC/Ferritin/ %Sat No results found  for: IRON, TIBC, FERRITIN, IRONPCTSAT Lipid Panel     Component Value Date/Time   CHOL 162 03/04/2018 1152   TRIG 117 03/04/2018 1152   HDL 50 03/04/2018 1152   CHOLHDL 4.2 04/23/2011 0830   CHOLHDL 4.3 04/23/2011 0830   VLDL 29 04/23/2011 0830   VLDL 29 04/23/2011 0830   LDLCALC 89 03/04/2018 1152   Hepatic Function Panel     Component Value Date/Time   PROT 6.4 03/04/2018 1152   ALBUMIN 4.1 03/04/2018 1152   AST 19 03/04/2018 1152   ALT 40 (H) 03/04/2018 1152   ALKPHOS 61 03/04/2018 1152   BILITOT 0.4 03/04/2018 1152   BILIDIR 0.1 05/22/2016 0913   IBILI 0.6 05/22/2016 0913      Component Value Date/Time   TSH 0.638 11/20/2017 0925   TSH 0.112 (L) 05/24/2016 0629   TSH 0.345 (L) 06/18/2014 0930   TSH 0.167 (L) 10/28/2012 0144      I, Trixie Dredge, am acting as transcriptionist for [employer], DO  I have reviewed the above documentation for accuracy and completeness, and  I agree with the above. -Jearld Lesch, DO

## 2018-11-18 ENCOUNTER — Emergency Department (HOSPITAL_COMMUNITY): Payer: Medicare Other

## 2018-11-18 ENCOUNTER — Encounter (HOSPITAL_COMMUNITY): Payer: Self-pay

## 2018-11-18 ENCOUNTER — Other Ambulatory Visit: Payer: Self-pay

## 2018-11-18 ENCOUNTER — Emergency Department (HOSPITAL_COMMUNITY)
Admission: EM | Admit: 2018-11-18 | Discharge: 2018-11-19 | Disposition: A | Discharge status: Home / Self Care | Payer: Medicare Other | Attending: Emergency Medicine | Admitting: Emergency Medicine

## 2018-11-18 DIAGNOSIS — Z79899 Other long term (current) drug therapy: Secondary | ICD-10-CM | POA: Insufficient documentation

## 2018-11-18 DIAGNOSIS — I110 Hypertensive heart disease with heart failure: Secondary | ICD-10-CM | POA: Insufficient documentation

## 2018-11-18 DIAGNOSIS — J45909 Unspecified asthma, uncomplicated: Secondary | ICD-10-CM | POA: Diagnosis not present

## 2018-11-18 DIAGNOSIS — Z91040 Latex allergy status: Secondary | ICD-10-CM | POA: Diagnosis not present

## 2018-11-18 DIAGNOSIS — E119 Type 2 diabetes mellitus without complications: Secondary | ICD-10-CM | POA: Insufficient documentation

## 2018-11-18 DIAGNOSIS — Z8673 Personal history of transient ischemic attack (TIA), and cerebral infarction without residual deficits: Secondary | ICD-10-CM | POA: Insufficient documentation

## 2018-11-18 DIAGNOSIS — Z96652 Presence of left artificial knee joint: Secondary | ICD-10-CM | POA: Insufficient documentation

## 2018-11-18 DIAGNOSIS — Z87891 Personal history of nicotine dependence: Secondary | ICD-10-CM | POA: Diagnosis not present

## 2018-11-18 DIAGNOSIS — R079 Chest pain, unspecified: Secondary | ICD-10-CM | POA: Diagnosis present

## 2018-11-18 DIAGNOSIS — I5033 Acute on chronic diastolic (congestive) heart failure: Secondary | ICD-10-CM | POA: Diagnosis not present

## 2018-11-18 DIAGNOSIS — I509 Heart failure, unspecified: Secondary | ICD-10-CM

## 2018-11-18 LAB — CBC
HCT: 37.2 % (ref 36.0–46.0)
Hemoglobin: 11.7 g/dL — ABNORMAL LOW (ref 12.0–15.0)
MCH: 26.4 pg (ref 26.0–34.0)
MCHC: 31.5 g/dL (ref 30.0–36.0)
MCV: 84 fL (ref 80.0–100.0)
Platelets: 212 10*3/uL (ref 150–400)
RBC: 4.43 MIL/uL (ref 3.87–5.11)
RDW: 15.6 % — ABNORMAL HIGH (ref 11.5–15.5)
WBC: 5.9 10*3/uL (ref 4.0–10.5)
nRBC: 0 % (ref 0.0–0.2)

## 2018-11-18 LAB — BASIC METABOLIC PANEL
Anion gap: 8 (ref 5–15)
BUN: 11 mg/dL (ref 6–20)
CO2: 26 mmol/L (ref 22–32)
Calcium: 8.6 mg/dL — ABNORMAL LOW (ref 8.9–10.3)
Chloride: 112 mmol/L — ABNORMAL HIGH (ref 98–111)
Creatinine, Ser: 0.92 mg/dL (ref 0.44–1.00)
GFR calc Af Amer: >60 mL/min (ref >60)
GFR calc non Af Amer: >60 mL/min (ref >60)
Glucose, Bld: 140 mg/dL — ABNORMAL HIGH (ref 70–99)
Potassium: 3.7 mmol/L (ref 3.5–5.1)
Sodium: 146 mmol/L — ABNORMAL HIGH (ref 135–145)

## 2018-11-18 LAB — TROPONIN I (HIGH SENSITIVITY): Troponin I (High Sensitivity): 18 ng/L — ABNORMAL HIGH (ref <18)

## 2018-11-18 LAB — I-STAT BETA HCG BLOOD, ED (MC, WL, AP ONLY): I-stat hCG, quantitative: <5 m[IU]/mL (ref <5)

## 2018-11-18 MED ORDER — SODIUM CHLORIDE 0.9% FLUSH: 3.0000 mL | Freq: Once | INTRAVENOUS | Status: DC

## 2018-11-18 NOTE — ED Triage Notes (Signed)
Pt reports chest pain and shortness of breath that started last night. Reports some swelling in her extremities as well. She reports pain radiates from her shoulders to her back. She also reports increased fatigue. Patient does seem somewhat lethargic in triage.

## 2018-11-19 ENCOUNTER — Telehealth (INDEPENDENT_AMBULATORY_CARE_PROVIDER_SITE_OTHER): Payer: Medicare Other | Admitting: Bariatrics

## 2018-11-19 DIAGNOSIS — I5033 Acute on chronic diastolic (congestive) heart failure: Secondary | ICD-10-CM | POA: Diagnosis not present

## 2018-11-19 LAB — BRAIN NATRIURETIC PEPTIDE: B Natriuretic Peptide: 26.4 pg/mL (ref 0.0–100.0)

## 2018-11-19 LAB — TROPONIN I (HIGH SENSITIVITY): Troponin I (High Sensitivity): 17 ng/L (ref <18)

## 2018-11-19 MED ORDER — ACETAMINOPHEN 500 MG PO TABS
1000.0000 mg | ORAL_TABLET | Freq: Once | ORAL | Status: AC
  Administered 2018-11-19: 1000 mg via ORAL
  Filled 2018-11-19: qty 2

## 2018-11-19 MED ORDER — FUROSEMIDE 10 MG/ML IJ SOLN
40.0000 mg | INTRAMUSCULAR | Status: AC
  Administered 2018-11-19: 40 mg via INTRAVENOUS
  Filled 2018-11-19: qty 4

## 2018-11-19 NOTE — Discharge Instructions (Addendum)
Please double your furosemide for the next 3 days.  If you have new or worsening symptoms return to the ER.  If your symptoms persist, but don't worsen, follow-up with your doctor.

## 2018-11-19 NOTE — ED Notes (Signed)
Pt ambulated with stand-by assist. Denies shortness of breath/lightheadedness. SpO2 maintained at 99-100% room air.

## 2018-11-19 NOTE — ED Provider Notes (Signed)
[facility] EMERGENCY DEPARTMENT Provider Note   CSN: [id number] Arrival date & time: 11/18/18  2021     History   Chief Complaint Chief Complaint  Patient presents with  . Chest Pain    HPI [name] is a 58 y.o. female.     Patient with PMH of CHF, DM, prior PE, presents to the ED with a chief complaint of SOB and chest pain.  She states that the symptoms started 2 nights ago.  She reports taking her home lasix, but no making much urine.  She denies any fever or cough, but has had some chills.  She feels more short of breath with activity and has had to sleep with her pillow folded under her head to help her breath better.  She denies any other associated symptoms.  The history is provided by the patient. No language interpreter was used.    Past Medical History:  Diagnosis Date  . Anginal pain ([place])    a. NL cath in 2008;  b. Myoview 03/2011: dec uptake along mid anterior wall on stress imaging -> ? attenuation vs. ischemia, EF 65%;  c. Echo 04/2011: EF 55-60%, no RWMA, Gr 2 dd  . Anxiety   . Arthritis   . Asthma   . Back pain   . Bone cancer ([place])   . Cancer ([place])   . Chest pain   . CHF (congestive heart failure) ([place])   . CHF (congestive heart failure) ([place])   . Depression   . Diabetes mellitus   . Drug use   . Dyspnea    with exertion  . Dyspnea   . Frequent urination   . GERD (gastroesophageal reflux disease)   . Glaucoma   . HLD (hyperlipidemia)   . Hypertension   . IBS (irritable bowel syndrome)   . Joint pain   . Lactose intolerance   . Leg edema   . 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 Obesity   . Palpitations   . Pneumonia 05/2016   double  . Pulmonary edema   . Pulmonary embolism ([place])    a. 2008 -> coumadin x 6 mos.  . Rheumatoid arthritis ([place])   . Sleep apnea    on CPAP 02/2018  . TIA (transient ischemic attack)   . Urinary urgency     Patient Active Problem List   Diagnosis Date Noted   . Unilateral primary osteoarthritis, left hip   . Hip arthritis 06/11/2018  . Class 3 severe obesity with serious comorbidity and body mass index (BMI) of 40.0 to 44.9 in adult ([place]) 05/29/2018  . Other fatigue 11/20/2017  . Shortness of breath on exertion 11/20/2017  . Type 2 diabetes mellitus without complication, without long-term current use of insulin ([place]) 11/20/2017  . Vitamin D deficiency 11/20/2017  . Depression 11/20/2017  . Other hyperlipidemia 11/20/2017  . 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 Presence of left artificial knee joint 09/20/2016  . Arthritis of knee 08/22/2016  . Cervical radiculopathy 07/26/2016  . Cervical disc disorder with radiculopathy 07/26/2016  . Right arm pain 06/29/2016  . Cervicalgia 06/29/2016  . Primary osteoarthritis of left knee 06/29/2016  . Hypersomnia with sleep apnea 11/18/2015  . Lethargy 11/18/2015  . Chest pain 09/30/2014  . Abnormal cardiac function test 09/29/2014  . Chest pain with moderate risk for cardiac etiology 09/28/2014  . HTN (hypertension) 09/28/2014  . Hypokalemia   . Paresthesia 06/18/2014  .  Numbness and tingling of left arm and leg   . Unstable angina ([place]) 05/24/2014  . OSA on CPAP 05/24/2014  . Diabetes mellitus type 2 in obese ([place]) 10/29/2012  . S/P laparoscopic appendectomy 06/04/2012  . Diastolic CHF ([place]) 91/47/8295  . Mediastinal mass 01/09/2012  . SOB (shortness of breath) 06/20/2011  . Mediastinal abnormality 06/20/2011  . Dyslipidemia 12/23/2009  . GLAUCOMA 12/23/2009  . ARTHRITIS 12/23/2009  . 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 Morbid obesity-BMI 45 09/13/2006  . ANXIETY STATE NOS 09/13/2006  . DISORDER, DEPRESSIVE NEC 09/13/2006  . CARPAL TUNNEL SYNDROME, MILD 09/13/2006  . Unspecified essential hypertension 09/13/2006  . IBS 09/13/2006  . DEGENERATION, LUMBAR/LUMBOSACRAL DISC 09/13/2006  . SYMPTOM, SWELLING/MASS/LUMP IN CHEST 09/13/2006  . PULMONARY EMBOLISM, HX OF 09/13/2006    Past  Surgical History:  Procedure Laterality Date  . ABDOMINAL HYSTERECTOMY  2005  . APPENDECTOMY    . CARDIAC CATHETERIZATION     Normal  . CARDIAC CATHETERIZATION N/A 09/30/2014   Procedure: Left Heart Cath and Coronary Angiography;  Surgeon: Sherren Mocha, MD;  Location: [facility];  Service: Cardiovascular;  Laterality: N/A;  . LAPAROSCOPIC APPENDECTOMY N/A 06/03/2012   Procedure: APPENDECTOMY LAPAROSCOPIC;  Surgeon: Stark Klein, MD;  Location: [facility];  Service: General;  Laterality: N/A;  . Left knee surgery  2008  . LEG SURGERY    . TONSILLECTOMY    . TOTAL HIP ARTHROPLASTY Left 06/11/2018   Procedure: LEFT TOTAL HIP ARTHROPLASTY ANTERIOR APPROACH;  Surgeon: Meredith Pel, MD;  Location: [facility];  Service: Orthopedics;  Laterality: Left;  . TOTAL KNEE ARTHROPLASTY Left 08/22/2016   Procedure: TOTAL KNEE ARTHROPLASTY;  Surgeon: Meredith Pel, MD;  Location: [facility];  Service: Orthopedics;  Laterality: Left;  . TUBAL LIGATION  1989     OB History    Gravida  3   Para      Term      Preterm      AB      Living        SAB      TAB      Ectopic      Multiple      Live Births               Home Medications    Prior to Admission medications   Medication Sig Start Date End Date Taking? Authorizing Provider  acetaminophen (TYLENOL) 325 MG tablet Take 1-2 tablets (325-650 mg total) by mouth every 6 (six) hours as needed for mild pain (pain score 1-3 or temp > 100.5). 06/14/18   Meredith Pel, MD  albuterol (PROVENTIL HFA;VENTOLIN HFA) 108 (90 Base) MCG/ACT inhaler Inhale 2 puffs into the lungs every 6 (six) hours as needed for wheezing or shortness of breath.    [provider]  amLODipine (NORVASC) 5 MG tablet Take 5 mg by mouth daily. 06/08/17   [provider]  atorvastatin (LIPITOR) 40 MG tablet Take 40 mg by mouth at bedtime.     [provider]  carvedilol (COREG CR) 10 MG 24 hr capsule Take 10 mg by mouth daily.     [provider]  furosemide (LASIX) 40 MG tablet Take 40 mg by mouth daily.    Rai, Ripudeep K, MD  isosorbide dinitrate (ISORDIL) 20 MG tablet Take 20 mg by mouth 2 (two) times daily.     [provider]  KOMBIGLYZE XR 5-500 MG TB24 Take 1 tablet by mouth every morning.  10/11/15  [provider]  lidocaine (LIDODERM) 5 % Place 1 patch onto the skin daily. Remove & Discard patch within 12 hours or as directed by MD Patient taking differently: Place 1 patch onto the skin daily as needed (for pain). Remove & Discard patch within 12 hours or as directed by MD 03/06/18   Meredith Pel, MD  lisinopril (PRINIVIL,ZESTRIL) 20 MG tablet Take 20 mg by mouth daily.  09/21/14   [provider]  loperamide (IMODIUM A-D) 2 MG tablet Take 4 mg by mouth 4 (four) times daily as needed for diarrhea or loose stools.     [provider]  meloxicam (MOBIC) 15 MG tablet TAKE 1 TABLET BY MOUTH EVERY DAY X2 WEEKS 10/21/18   Meredith Pel, MD  MYRBETRIQ 25 MG TB24 tablet Take 25 mg by mouth daily. 05/23/17   [provider]  omeprazole (PRILOSEC) 20 MG capsule Take 20 mg by mouth 2 (two) times daily. 10/05/16   [provider]  oxyCODONE-acetaminophen (PERCOCET/ROXICET) 5-325 MG tablet Take 2 tablets by mouth every 6 (six) hours as needed for severe pain. 09/02/18   Hayden Rasmussen, MD  potassium chloride (KLOR-CON M10) 10 MEQ tablet Take 1 tablet (10 mEq total) by mouth 2 (two) times daily. 05/27/18   Jearld Lesch A, DO  sertraline (ZOLOFT) 100 MG tablet Take 100 mg by mouth daily.     [provider]  Vitamin D, Ergocalciferol, (DRISDOL) 1.25 MG (50000 UT) CAPS capsule Take 1 capsule (50,000 Units total) by mouth every 7 (seven) days. 10/29/18   Georgia Lopes, DO    Family History Family History  Problem Relation Age of Onset  . Emphysema Mother   . Arthritis Mother   . Heart failure Mother        alive @ 39  . Stroke Mother   .  Diabetes Mother   . Hypertension Mother   . Hyperlipidemia Mother   . Depression Mother   . Anxiety disorder Mother   . Asthma Brother   . Heart disease Father        died @ 30's.  . Stroke Father   . Diabetes Father   . Hyperlipidemia Father   . Hypertension Father   . Bipolar disorder Father   . Sleep apnea Father   . Alcoholism Father   . Drug abuse Father   . Diabetes Sister   . Heart disease Paternal Grandfather   . Colon cancer Maternal Grandfather   . Breast cancer Maternal Aunt     Social History Social History   Tobacco Use  . Smoking status: Former Smoker    Packs/day: 0.50    Years: 15.00    Pack years: 7.50    Types: Cigarettes    Quit date: 04/24/1985    Years since quitting: 33.5  . Smokeless tobacco: Never Used  Substance Use Topics  . Alcohol use: No    Alcohol/week: 0.0 standard drinks  . Drug use: Not Currently    Types: Marijuana     Allergies   Penicillins, Hydrocodone-acetaminophen, Latex, and Sulfa antibiotics   Review of Systems Review of Systems  All other systems reviewed and are negative.    Physical Exam Updated Vital Signs BP (!) 143/72 (BP Location: Left Arm)   Pulse 72   Temp 98.4 F (36.9 C) (Oral)   Resp 16   SpO2 98%   Physical Exam Vitals signs and nursing note reviewed.  Constitutional:      General: She is  not in acute distress.    Appearance: She is well-developed.  HENT:     Head: Normocephalic and atraumatic.  Eyes:     Conjunctiva/sclera: Conjunctivae normal.  Neck:     Musculoskeletal: Neck supple.  Cardiovascular:     Rate and Rhythm: Normal rate and regular rhythm.     Heart sounds: No murmur.  Pulmonary:     Effort: Pulmonary effort is normal. No respiratory distress.     Breath sounds: Normal breath sounds. No wheezing.  Abdominal:     Palpations: Abdomen is soft.     Tenderness: There is no abdominal tenderness.  Musculoskeletal: Normal range of motion.     Comments: Trace pretibial edema   Skin:    General: Skin is warm and dry.  Neurological:     Mental Status: She is alert and oriented to person, place, and time.  Psychiatric:        Mood and Affect: Mood normal.        Behavior: Behavior normal.      ED Treatments / Results  Labs (all labs ordered are listed, but only abnormal results are displayed) Labs Reviewed  BASIC METABOLIC PANEL - Abnormal; Notable for the following components:      Result Value   Sodium 146 (*)    Chloride 112 (*)    Glucose, Bld 140 (*)    Calcium 8.6 (*)    All other components within normal limits  CBC - Abnormal; Notable for the following components:   Hemoglobin 11.7 (*)    RDW 15.6 (*)    All other components within normal limits  TROPONIN I (HIGH SENSITIVITY) - Abnormal; Notable for the following components:   Troponin I (High Sensitivity) 18 (*)    All other components within normal limits  BRAIN NATRIURETIC PEPTIDE  I-STAT BETA HCG BLOOD, ED ([facility], [facility], [facility] ONLY)  TROPONIN I (HIGH SENSITIVITY)    EKG EKG Interpretation  Date/Time:  Monday November 18 2018 20:34:31 EDT Ventricular Rate:  85 PR Interval:  188 QRS Duration: 104 QT Interval:  380 QTC Calculation: 452 R Axis:   68 Text Interpretation:  Normal sinus rhythm ST & T wave abnormality, consider inferolateral ischemia Abnormal ECG new t wave inversions Confirmed by Isla Pence [phone]) on 11/18/2018 8:46:50 PM   Radiology Dg Chest 2 View  Result Date: 11/18/2018 CLINICAL DATA:  Initial evaluation for acute chest pain with shortness of breath, extremity swelling. EXAM: CHEST - 2 VIEW COMPARISON:  Prior radiograph from 09/02/2018 FINDINGS: Mild cardiomegaly, stable. Mediastinal silhouette within normal limits. Lungs normally inflated. Diffuse pulmonary vascular congestion with interstitial prominence, suggesting pulmonary interstitial edema. No significant pleural effusion. No definite superimposed focal infiltrates. No pneumothorax. No acute osseous finding.  IMPRESSION: Cardiomegaly with diffuse pulmonary vascular congestion and interstitial prominence, suggesting pulmonary interstitial edema. Electronically Signed   By: Jeannine Boga M.D.   On: 11/18/2018 21:27    Procedures Procedures (including critical care time)  Medications Ordered in ED Medications  sodium chloride flush (NS) 0.9 % injection 3 mL (has no administration in time range)  furosemide (LASIX) injection 40 mg (has no administration in time range)  acetaminophen (TYLENOL) tablet 1,000 mg (has no administration in time range)     Initial Impression / Assessment and Plan / ED Course  I have reviewed the triage vital signs and the nursing notes.  Pertinent labs & imaging results that were available during my care of the patient were reviewed by me and considered in my  medical decision making (see chart for details).        Patient with SOB and CP.  Hx of CHF.  CXR consistent with pulmonary vascular congestion and interstitial edema.  O2 sats have been normal.  Patient is non-toxic appearing.  Will give a dose of IV lasix here and reassess.  Anticipated discharge.  6:49 AM Feeling improved.  Ambulates maintaining 99-100%.  Will have patient increase her diuretic for the next 3 days.  Return precautions discussed.  Final Clinical Impressions(s) / ED Diagnoses   Final diagnoses:  Acute on chronic congestive heart failure, unspecified heart failure type [facility])    ED Discharge Orders    None       Montine Circle, PA-C 11/19/18 [id number]    Mesner, Corene Cornea, MD 11/20/18 [phone]

## 2018-11-26 ENCOUNTER — Encounter (INDEPENDENT_AMBULATORY_CARE_PROVIDER_SITE_OTHER): Payer: Self-pay | Admitting: Bariatrics

## 2018-11-26 ENCOUNTER — Telehealth (INDEPENDENT_AMBULATORY_CARE_PROVIDER_SITE_OTHER): Payer: Medicare Other | Admitting: Bariatrics

## 2018-11-26 ENCOUNTER — Other Ambulatory Visit: Payer: Self-pay

## 2018-11-26 DIAGNOSIS — E559 Vitamin D deficiency, unspecified: Secondary | ICD-10-CM

## 2018-11-26 DIAGNOSIS — F3289 Other specified depressive episodes: Secondary | ICD-10-CM

## 2018-11-26 DIAGNOSIS — E119 Type 2 diabetes mellitus without complications: Secondary | ICD-10-CM | POA: Diagnosis not present

## 2018-11-26 DIAGNOSIS — Z6841 Body Mass Index (BMI) 40.0 and over, adult: Secondary | ICD-10-CM

## 2018-11-26 MED ORDER — MYRBETRIQ 25 MG PO TB24: 25.0000 mg | ORAL_TABLET | Freq: Every day | ORAL | 1 refills | Status: DC

## 2018-11-26 MED ORDER — JANUMET XR 50-500 MG PO TB24: 50.0000 mg | ORAL_TABLET | Freq: Every day | ORAL | 1 refills | Status: DC

## 2018-11-26 NOTE — Progress Notes (Signed)
Office: [phone]  /  Fax: [phone] TeleHealth Visit:  [name] has verbally consented to this TeleHealth visit today. The patient is located at home, the provider is located at the [facility] office. The participants in this visit include the listed provider and patient. The visit was conducted today via telephone call.  HPI:   Chief Complaint: OBESITY [name] is here to discuss her progress with her obesity treatment plan. She is on the Category 2 plan and is following her eating plan approximately 40% of the time. She states she is walking at the mall 30 minutes 2 times per week. [name] states that she has gained about 4 lbs (weight 211). She was retaining fluid, which increased her weight to 215 lbs; is now down to 211 lbs. We were unable to weigh the patient today for this TeleHealth visit. She feels as if she has gained weight since her last visit. She has lost 21 lbs since starting treatment with [name].  Diabetes II [name] has a diagnosis of diabetes type II. [name] does not report checking her blood sugars. Last A1c was 6.6 on 05/22/2018. She has been working on intensive lifestyle modifications including diet, exercise, and weight loss to help control her blood glucose levels.  Stress Incontinence [name] states her stress incontinence is controlled with medication.  History of Diastolic Congestive Heart Failure [name] reports occasional shortness of breath, but is improved. She states she was retaining fluid and recently went to the ER where her weight was up to 215 lbs, is now down to 211 lbs.  ASSESSMENT AND PLAN:  Type 2 diabetes mellitus without complication, without long-term current use of insulin (HCC)  Other depression  Class 3 severe obesity with serious comorbidity and body mass index (BMI) of 40.0 to 44.9 in adult, unspecified obesity type ([place])  Vitamin D deficiency  PLAN:  Diabetes II [name] has been given extensive diabetes  education by myself today including ideal fasting and post-prandial blood glucose readings, individual ideal HgA1c goals  and hypoglycemia prevention. We discussed the importance of good blood sugar control to decrease the likelihood of diabetic complications such as nephropathy, neuropathy, limb loss, blindness, coronary artery disease, and death. We discussed the importance of intensive lifestyle modification including diet, exercise and weight loss as the first line treatment for diabetes. [name] was given a prescription for Kombiglyze XR 50-500 mg #30 with 1 refill and agrees to follow-up with our clinic in 2 weeks.   Stress Incontinence [name] was given a prescription for Myrbetriq 25 mg TB24 tablet 1 daily #30 with 1 refill and agrees to follow-up with our clinic in 2 weeks.  History of Diastolic Congestive Heart Failure [name] will follow-up with her PCP/cardiologist to see if fluid is increasing taking Lasix. She will call her PCP and get in ASAP.  Obesity [name] is currently in the action stage of change. As such, her goal is to continue with weight loss efforts. She has agreed to follow the Category 2 plan. [name] will work on meal planning and intentional eating. [name] will start swimming for weight loss and overall health benefits. We discussed the following Behavioral Modification Strategies today: increasing lean protein intake, decreasing simple carbohydrates, increasing vegetables, increase H20 intake, decrease eating out, no skipping meals, work on meal planning and easy cooking plans, keeping healthy foods in the home, better snacking choices, and planning for success.  [name] has agreed to follow-up with our clinic in 2 weeks. She was informed of the  importance of frequent follow-up visits to maximize her success with intensive lifestyle modifications for her multiple health conditions.  ALLERGIES: Allergies  Allergen Reactions  . Penicillins Swelling and Other (See Comments)     Has patient had a PCN reaction causing immediate rash, facial/tongue/throat swelling, SOB or lightheadedness with hypotension: YES Has patient had a PCN reaction causing severe rash involving mucus membranes or skin necrosis: NO Has patient had a PCN reaction that required hospitalization NO Has patient had a PCN reaction occurring within the last 10 years: NO If all of the above answers are "NO", then may proceed with Cephalosporin use.  [name] Hydrocodone-Acetaminophen Itching    confusion  . Latex Rash  . Sulfa Antibiotics Diarrhea, Itching and Rash    MEDICATIONS: Current Outpatient Medications on File Prior to Visit  Medication Sig Dispense Refill  . acetaminophen (TYLENOL) 325 MG tablet Take 1-2 tablets (325-650 mg total) by mouth every 6 (six) hours as needed for mild pain (pain score 1-3 or temp > 100.5). 60 tablet 0  . albuterol (PROVENTIL HFA;VENTOLIN HFA) 108 (90 Base) MCG/ACT inhaler Inhale 2 puffs into the lungs every 6 (six) hours as needed for wheezing or shortness of breath.    [name] amLODipine (NORVASC) 5 MG tablet Take 5 mg by mouth daily.  6  . atorvastatin (LIPITOR) 40 MG tablet Take 40 mg by mouth at bedtime.     . carvedilol (COREG CR) 10 MG 24 hr capsule Take 10 mg by mouth daily.    . furosemide (LASIX) 40 MG tablet Take 40 mg by mouth daily.    . isosorbide dinitrate (ISORDIL) 20 MG tablet Take 20 mg by mouth 2 (two) times daily.     [name] lidocaine (LIDODERM) 5 % Place 1 patch onto the skin daily. Remove & Discard patch within 12 hours or as directed by MD (Patient taking differently: Place 1 patch onto the skin daily as needed (for pain). Remove & Discard patch within 12 hours or as directed by MD) 30 patch 0  . lisinopril (PRINIVIL,ZESTRIL) 20 MG tablet Take 20 mg by mouth daily.     [name] loperamide (IMODIUM A-D) 2 MG tablet Take 4 mg by mouth 4 (four) times daily as needed for diarrhea or loose stools.     . meloxicam (MOBIC) 15 MG tablet TAKE 1 TABLET BY MOUTH EVERY DAY X2  WEEKS (Patient not taking: Reported on 11/19/2018) 30 tablet 0  . omeprazole (PRILOSEC) 20 MG capsule Take 20 mg by mouth 2 (two) times daily.    [name] oxyCODONE-acetaminophen (PERCOCET/ROXICET) 5-325 MG tablet Take 2 tablets by mouth every 6 (six) hours as needed for severe pain. 12 tablet 0  . potassium chloride (KLOR-CON M10) 10 MEQ tablet Take 1 tablet (10 mEq total) by mouth 2 (two) times daily. 60 tablet 1  . sertraline (ZOLOFT) 100 MG tablet Take 100 mg by mouth daily.     . Vitamin D, Ergocalciferol, (DRISDOL) 1.25 MG (50000 UT) CAPS capsule Take 1 capsule (50,000 Units total) by mouth every 7 (seven) days. 4 capsule 0  . [DISCONTINUED] KOMBIGLYZE XR 5-500 MG TB24 Take 1 tablet by mouth daily.      No current facility-administered medications on file prior to visit.     PAST MEDICAL HISTORY: Past Medical History:  Diagnosis Date  . Anginal pain ([place])    a. NL cath in 2008;  b. Myoview 03/2011: dec uptake along mid anterior wall on stress imaging -> ? attenuation vs. ischemia, EF 65%;  c. Echo 04/2011: EF 55-60%, no RWMA, Gr 2 dd  . Anxiety   . Arthritis   . Asthma   . Back pain   . Bone cancer ([place])   . Cancer ([place])   . Chest pain   . CHF (congestive heart failure) ([place])   . CHF (congestive heart failure) ([place])   . Depression   . Diabetes mellitus   . Drug use   . Dyspnea    with exertion  . Dyspnea   . Frequent urination   . GERD (gastroesophageal reflux disease)   . Glaucoma   . HLD (hyperlipidemia)   . Hypertension   . IBS (irritable bowel syndrome)   . Joint pain   . Lactose intolerance   . Leg edema   . 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 Obesity   . Palpitations   . Pneumonia 05/2016   double  . Pulmonary edema   . Pulmonary embolism ([place])    a. 2008 -> coumadin x 6 mos.  . Rheumatoid arthritis ([place])   . Sleep apnea    on CPAP 02/2018  . TIA (transient ischemic attack)   . Urinary urgency     PAST SURGICAL HISTORY: Past Surgical  History:  Procedure Laterality Date  . ABDOMINAL HYSTERECTOMY  2005  . APPENDECTOMY    . CARDIAC CATHETERIZATION     Normal  . CARDIAC CATHETERIZATION N/A 09/30/2014   Procedure: Left Heart Cath and Coronary Angiography;  Surgeon: Sherren Mocha, MD;  Location: [facility];  Service: Cardiovascular;  Laterality: N/A;  . LAPAROSCOPIC APPENDECTOMY N/A 06/03/2012   Procedure: APPENDECTOMY LAPAROSCOPIC;  Surgeon: Stark Klein, MD;  Location: [facility];  Service: General;  Laterality: N/A;  . Left knee surgery  2008  . LEG SURGERY    . TONSILLECTOMY    . TOTAL HIP ARTHROPLASTY Left 06/11/2018   Procedure: LEFT TOTAL HIP ARTHROPLASTY ANTERIOR APPROACH;  Surgeon: Meredith Pel, MD;  Location: [facility];  Service: Orthopedics;  Laterality: Left;  . TOTAL KNEE ARTHROPLASTY Left 08/22/2016   Procedure: TOTAL KNEE ARTHROPLASTY;  Surgeon: Meredith Pel, MD;  Location: [facility];  Service: Orthopedics;  Laterality: Left;  . TUBAL LIGATION  1989    SOCIAL HISTORY: Social History   Tobacco Use  . Smoking status: Former Smoker    Packs/day: 0.50    Years: 15.00    Pack years: 7.50    Types: Cigarettes    Quit date: 04/24/1985    Years since quitting: 33.6  . Smokeless tobacco: Never Used  Substance Use Topics  . Alcohol use: No    Alcohol/week: 0.0 standard drinks  . Drug use: Not Currently    Types: Marijuana    FAMILY HISTORY: Family History  Problem Relation Age of Onset  . Emphysema Mother   . Arthritis Mother   . Heart failure Mother        alive @ 24  . Stroke Mother   . Diabetes Mother   . Hypertension Mother   . Hyperlipidemia Mother   . Depression Mother   . Anxiety disorder Mother   . Asthma Brother   . Heart disease Father        died @ 37's.  . Stroke Father   . Diabetes Father   . Hyperlipidemia Father   . Hypertension Father   . Bipolar disorder Father   . Sleep apnea Father   . Alcoholism Father   . Drug abuse  Father   . Diabetes Sister   . Heart disease  Paternal Grandfather   . Colon cancer Maternal Grandfather   . Breast cancer Maternal Aunt    ROS: Review of Systems  Cardiovascular:       Positive for history of diastolic CHF.  Genitourinary:       Positive for stress incontinence.  Endo/Heme/Allergies:       Negative for hypoglycemia.   PHYSICAL EXAM: Pt in no acute distress  RECENT LABS AND TESTS: BMET    Component Value Date/Time   NA 146 (H) 11/18/2018 2043   NA 144 03/04/2018 1152   K 3.7 11/18/2018 2043   CL 112 (H) 11/18/2018 2043   CO2 26 11/18/2018 2043   GLUCOSE 140 (H) 11/18/2018 2043   BUN 11 11/18/2018 2043   BUN 17 03/04/2018 1152   CREATININE 0.92 11/18/2018 2043   CALCIUM 8.6 (L) 11/18/2018 2043   GFRNONAA >60 11/18/2018 2043   GFRAA >60 11/18/2018 2043   Lab Results  Component Value Date   HGBA1C 6.6 (H) 05/22/2018   HGBA1C 6.8 (H) 03/04/2018   HGBA1C 8.8 (H) 11/20/2017   HGBA1C 7.4 (H) 08/11/2016   HGBA1C 6.6 (H) 05/21/2016   Lab Results  Component Value Date   INSULIN 20.2 03/04/2018   INSULIN 53.9 (H) 11/20/2017   CBC    Component Value Date/Time   WBC 5.9 11/18/2018 2043   RBC 4.43 11/18/2018 2043   HGB 11.7 (L) 11/18/2018 2043   HGB 12.8 11/20/2017 0925   HCT 37.2 11/18/2018 2043   HCT 40.6 11/20/2017 0925   PLT 212 11/18/2018 2043   MCV 84.0 11/18/2018 2043   MCV 85 11/20/2017 0925   MCH 26.4 11/18/2018 2043   MCHC 31.5 11/18/2018 2043   RDW 15.6 (H) 11/18/2018 2043   RDW 13.9 11/20/2017 0925   LYMPHSABS 2.7 06/12/2018 1321   LYMPHSABS 2.1 11/20/2017 0925   MONOABS 0.8 06/12/2018 1321   EOSABS 0.2 06/12/2018 1321   EOSABS 0.4 11/20/2017 0925   BASOSABS 0.0 06/12/2018 1321   BASOSABS 0.0 11/20/2017 0925   Iron/TIBC/Ferritin/ %Sat No results found for: IRON, TIBC, FERRITIN, IRONPCTSAT Lipid Panel     Component Value Date/Time   CHOL 162 03/04/2018 1152   TRIG 117 03/04/2018 1152   HDL 50 03/04/2018 1152   CHOLHDL 4.2 04/23/2011 0830   CHOLHDL 4.3 04/23/2011 0830    VLDL 29 04/23/2011 0830   VLDL 29 04/23/2011 0830   LDLCALC 89 03/04/2018 1152   Hepatic Function Panel     Component Value Date/Time   PROT 6.4 03/04/2018 1152   ALBUMIN 4.1 03/04/2018 1152   AST 19 03/04/2018 1152   ALT 40 (H) 03/04/2018 1152   ALKPHOS 61 03/04/2018 1152   BILITOT 0.4 03/04/2018 1152   BILIDIR 0.1 05/22/2016 0913   IBILI 0.6 05/22/2016 0913      Component Value Date/Time   TSH 0.638 11/20/2017 0925   TSH 0.112 (L) 05/24/2016 0629   TSH 0.345 (L) 06/18/2014 0930   TSH 0.167 (L) 10/28/2012 0144   Results for RICCA, MELGAREJO (MRN [id number]) as of 11/26/2018 15:55  Ref. Range 03/04/2018 11:52  Vitamin D, 25-Hydroxy Latest Ref Range: 30.0 - 100.0 ng/mL 34.5    I, Michaelene Song, am acting as Location manager for [employer], DO  I have reviewed the above documentation for accuracy and completeness, and I agree with the above. -Jearld Lesch, DO

## 2018-11-27 ENCOUNTER — Other Ambulatory Visit (INDEPENDENT_AMBULATORY_CARE_PROVIDER_SITE_OTHER): Payer: Self-pay | Admitting: Bariatrics

## 2018-11-27 DIAGNOSIS — E559 Vitamin D deficiency, unspecified: Secondary | ICD-10-CM

## 2018-12-04 ENCOUNTER — Encounter: Payer: Self-pay | Admitting: Orthopedic Surgery

## 2018-12-04 ENCOUNTER — Ambulatory Visit: Payer: Medicare Other

## 2018-12-04 ENCOUNTER — Ambulatory Visit (INDEPENDENT_AMBULATORY_CARE_PROVIDER_SITE_OTHER): Payer: Medicare Other | Admitting: Orthopedic Surgery

## 2018-12-04 ENCOUNTER — Other Ambulatory Visit: Payer: Self-pay

## 2018-12-04 VITALS — Ht <= 58 in | Wt 204.0 lb

## 2018-12-04 DIAGNOSIS — M5410 Radiculopathy, site unspecified: Secondary | ICD-10-CM | POA: Diagnosis not present

## 2018-12-04 DIAGNOSIS — M25552 Pain in left hip: Secondary | ICD-10-CM | POA: Diagnosis not present

## 2018-12-04 DIAGNOSIS — M542 Cervicalgia: Secondary | ICD-10-CM | POA: Diagnosis not present

## 2018-12-04 MED ORDER — MELOXICAM 15 MG PO TABS: ORAL_TABLET | ORAL | 0 refills | Status: DC

## 2018-12-04 NOTE — Progress Notes (Signed)
Office Visit Note   Patient: [name]           Date of Birth: Oct 24, 1960           MRN: [id number] Visit Date: 12/04/2018 Requested by: Tsosie Billing, MD [place],  [place] [address] PCP: Tsosie Billing, MD (Inactive)  Subjective: Chief Complaint  Patient presents with   Left Hip - Routine Post Op    HPI: [name] is a 59 y.o. female who presents to the office complaining of left hip pain.  Patient had a left total hip arthroplasty on 06/11/2018.  She had a ground-level fall just prior to her last visit 6 weeks ago.  She notes that she was having stinging, burning pain in her lateral hip prior to the fall.  She notes left groin pain since the fall.  She rates her pain an 8 out of 10..  She localizes her pain to her left groin, left lateral hip, left lumbar spine.  Her pain is worse when sleeping on the left side.  She had an MRI of the lumbar spine 13 months ago that revealed severe spinal stenosis.  She has had ESI's in the past with some relief.  She also reports right arm numbness and tingling.  This is been going on for several weeks and causes her great discomfort.  She notes associated neck pain and radicular right arm pain that travels to her fingertips.  She has had a MRI of the cervical spine in the past that revealed severe foraminal stenosis on the right at C6-C7.              ROS:  All systems reviewed are negative as they relate to the chief complaint within the history of present illness.  Patient denies fevers or chills.  Assessment & Plan: Visit Diagnoses:  1. Pain of left hip joint   2. Neck pain   3. Radicular leg pain     Plan: Patient is a 58 year old female who is about 6 months out from her left total hip arthroplasty.  Her symptoms point to multiple potential etiologies responsible for her pain.  She was having symptoms likely related to her back prior to her fall, and since her fall seems to have  additional symptoms stemming from her left hip joint.  The x-rays taken at today's visit reveal no change of what was thought may be a small fracture of the pelvis, superolateral to the cup.  Her implant seems stable according to these x-rays as well.  Patient has a history of spinal stenosis diagnosed on MRI of the L-spine 13 months ago.  With these factors in mind I ordered CT scan of the left hip as well as MRI of the L-spine.  Patient is also complaining of radicular pain and numbness and tingling of her right arm.  Patient has a history of severe right-sided foraminal stenosis at C6-C7, that is likely worse and responsible for her symptoms.  Referred patient to Dr. Ernestina Patches for ESI's of her lumbar and cervical spine.  Patient agreed with plan and will follow-up to review results of the MRI and CT scan  Patient seen and examined.  I agree with Luke's note.  Unclear etiology of left hip and leg pain.  Did have a fall.  No evidence of cup loosening but I think CT scan of the pelvis could help to delineate any bone abnormalities.  MRI lumbar spine also to evaluate stenosis.  Follow-up  after those studies..  Follow-Up Instructions: No follow-ups on file.   Orders:  Orders Placed This Encounter  Procedures   XR HIP UNILAT W OR W/O PELVIS 2-3 VIEWS LEFT   MR Lumbar Spine w/o contrast   CT HIP LEFT WO CONTRAST   Ambulatory referral to Physical Medicine Rehab   Meds ordered this encounter  Medications   meloxicam (MOBIC) 15 MG tablet    Sig: 1 po daily prn    Dispense:  30 tablet    Refill:  0      Procedures: No procedures performed   Clinical Data: No additional findings.  Objective: Vital Signs: Ht 4\' 10"  (1.473 m)    Wt 204 lb (92.5 kg)    BMI 42.64 kg/m   Physical Exam:  Constitutional: Patient appears well-developed HEENT:  Head: Normocephalic Eyes:EOM are normal Neck: Normal range of motion Cardiovascular: Normal rate Pulmonary/chest: Effort normal Neurologic: Patient  is alert Skin: Skin is warm Psychiatric: Patient has normal mood and affect  Ortho Exam:  Pain in left groin with left hip internal rotation TTP over the trochanteric bursa Pain with resisted abduction of the left lower extremity. Negative straight leg raise bilaterally Reduced sensation of all 5 fingers of the right upper extremity when compared to the contralateral side Negative Tinel's right wrist   Specialty Comments:  No specialty comments available.  Imaging: No results found.   PMFS History: Patient Active Problem List   Diagnosis Date Noted   Unilateral primary osteoarthritis, left hip    Hip arthritis 06/11/2018   Class 3 severe obesity with serious comorbidity and body mass index (BMI) of 40.0 to 44.9 in adult ([place]) 05/29/2018   Other fatigue 11/20/2017   Shortness of breath on exertion 11/20/2017   Type 2 diabetes mellitus without complication, without long-term current use of insulin ([place]) 11/20/2017   Vitamin D deficiency 11/20/2017   Depression 11/20/2017   Other hyperlipidemia 11/20/2017   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/04/2016   Presence of left artificial knee joint 09/20/2016   Arthritis of knee 08/22/2016   Cervical radiculopathy 07/26/2016   Cervical disc disorder with radiculopathy 07/26/2016   Right arm pain 06/29/2016   Cervicalgia 06/29/2016   Primary osteoarthritis of left knee 06/29/2016   Hypersomnia with sleep apnea 11/18/2015   Lethargy 11/18/2015   Chest pain 09/30/2014   Abnormal cardiac function test 09/29/2014   Chest pain with moderate risk for cardiac etiology 09/28/2014   HTN (hypertension) 09/28/2014   Hypokalemia    Paresthesia 06/18/2014   Numbness and tingling of left arm and leg    Unstable angina ([place]) 05/24/2014   OSA on CPAP 05/24/2014   Diabetes mellitus type 2 in obese ([place]) 10/29/2012   S/P laparoscopic appendectomy 05/39/7673   Diastolic CHF ([place]) 41/93/7902   Mediastinal mass  01/09/2012   SOB (shortness of breath) 06/20/2011   Mediastinal abnormality 06/20/2011   Dyslipidemia 12/23/2009   GLAUCOMA 12/23/2009   ARTHRITIS 12/23/2009   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 NL cath in 2008;  b. Myoview 03/2011: dec uptake along mid anterior wall on stress imaging -> ?  attenuation vs. ischemia, EF 65%;  c. Echo 04/2011: EF 55-60%, no RWMA, Gr 2 dd   Anxiety    Arthritis    Asthma    Back pain    Bone cancer (HCC)    Cancer (HCC)    Chest pain    CHF (congestive heart failure) (HCC)    CHF (congestive heart failure) ([place])    Depression    Diabetes mellitus    Drug use    Dyspnea    with exertion   Dyspnea    Frequent urination    GERD (gastroesophageal reflux disease)    Glaucoma    HLD (hyperlipidemia)    Hypertension    IBS (irritable bowel syndrome)    Joint pain    Lactose intolerance    Leg edema    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 benign thymoma   Obesity    Palpitations    Pneumonia 05/2016   double   Pulmonary edema    Pulmonary embolism ([place])    a. 2008 -> coumadin x 6 mos.   Rheumatoid arthritis ([place])    Sleep apnea    on CPAP 02/2018   TIA (transient ischemic attack)    Urinary urgency     Family History  Problem Relation Age of Onset   Emphysema Mother    Arthritis Mother    Heart failure Mother        alive @ 17   Stroke Mother    Diabetes Mother    Hypertension Mother    Hyperlipidemia Mother    Depression Mother    Anxiety disorder Mother     Asthma Brother    Heart disease Father        died @ 82's.   Stroke Father    Diabetes Father    Hyperlipidemia Father    Hypertension Father    Bipolar disorder Father    Sleep apnea Father    Alcoholism Father    Drug abuse Father    Diabetes Sister    Heart disease Paternal Grandfather    Colon cancer Maternal Grandfather    Breast cancer Maternal Aunt     Past Surgical History:  Procedure Laterality Date   ABDOMINAL HYSTERECTOMY  2005   APPENDECTOMY     CARDIAC CATHETERIZATION     Normal   CARDIAC CATHETERIZATION N/A 09/30/2014   Procedure: Left Heart Cath and Coronary Angiography;  Surgeon: Sherren Mocha, MD;  Location: [facility];  Service: Cardiovascular;  Laterality: N/A;   LAPAROSCOPIC APPENDECTOMY N/A 06/03/2012   Procedure: APPENDECTOMY LAPAROSCOPIC;  Surgeon: Stark Klein, MD;  Location: [facility];  Service: General;  Laterality: N/A;   Left knee surgery  2008   LEG SURGERY     TONSILLECTOMY     TOTAL HIP ARTHROPLASTY Left 06/11/2018   Procedure: LEFT TOTAL HIP ARTHROPLASTY ANTERIOR APPROACH;  Surgeon: Meredith Pel, MD;  Location: [facility];  Service: Orthopedics;  Laterality: Left;   TOTAL KNEE ARTHROPLASTY Left 08/22/2016   Procedure: TOTAL KNEE ARTHROPLASTY;  Surgeon: Meredith Pel, MD;  Location: [facility];  Service: Orthopedics;  Laterality: Left;   TUBAL LIGATION  1989   Social History   Occupational History   Occupation: UNEMPLOYED    Employer: DISABLED  Tobacco Use   Smoking status: Former Smoker    Packs/day: 0.50    Years: 15.00    Pack years: 7.50    Types: Cigarettes    Quit date: 04/24/1985  Years since quitting: 33.6   Smokeless tobacco: Never Used  Substance and Sexual Activity   Alcohol use: No    Alcohol/week: 0.0 standard drinks   Drug use: Not Currently    Types: Marijuana   Sexual activity: Never

## 2018-12-06 ENCOUNTER — Encounter: Payer: Self-pay | Admitting: Orthopedic Surgery

## 2018-12-10 ENCOUNTER — Encounter (INDEPENDENT_AMBULATORY_CARE_PROVIDER_SITE_OTHER): Payer: Self-pay | Admitting: Bariatrics

## 2018-12-10 ENCOUNTER — Telehealth (INDEPENDENT_AMBULATORY_CARE_PROVIDER_SITE_OTHER): Payer: Medicare Other | Admitting: Bariatrics

## 2018-12-10 ENCOUNTER — Other Ambulatory Visit: Payer: Self-pay

## 2018-12-10 DIAGNOSIS — E559 Vitamin D deficiency, unspecified: Secondary | ICD-10-CM | POA: Diagnosis not present

## 2018-12-10 DIAGNOSIS — E119 Type 2 diabetes mellitus without complications: Secondary | ICD-10-CM | POA: Diagnosis not present

## 2018-12-10 DIAGNOSIS — Z6841 Body Mass Index (BMI) 40.0 and over, adult: Secondary | ICD-10-CM

## 2018-12-10 MED ORDER — VITAMIN D (ERGOCALCIFEROL) 1.25 MG (50000 UNIT) PO CAPS: 50000.0000 [IU] | ORAL_CAPSULE | ORAL | 0 refills | Status: DC

## 2018-12-11 NOTE — Progress Notes (Signed)
Office: [phone]  /  Fax: [phone] TeleHealth Visit:  [name] has verbally consented to this TeleHealth visit today. The patient is driving, and the provider is located at the [facility] office. The participants in this visit include the listed provider and patient and any and all parties involved. The visit was conducted today via telephone. [name] was unable to use realtime audiovisual technology today and the telehealth visit was conducted via telephone.  HPI:   Chief Complaint: OBESITY [name] is here to discuss her progress with her obesity treatment plan. She is on the Category 2 plan and is following her eating plan approximately 70 % of the time. She states she is exercising 0 minutes 0 times per week. [name] states that she has gained 5 pounds (weight 215 lbs 12/10/18). We were unable to weigh the patient today for this TeleHealth visit. She feels as if she has gained weight since her last visit. She has lost 21 lbs since starting treatment with [name].  Diabetes II [name] has a diagnosis of diabetes type II. She is taking Janumet XR [name] states fasting BGs range between 110 and 130's and she denies any hypoglycemic episodes. She has been working on intensive lifestyle modifications including diet, exercise, and weight loss to help control her blood glucose levels.  Vitamin D deficiency [name] has a diagnosis of vitamin D deficiency. She is currently taking vit D and denies nausea, vomiting or muscle weakness.  ASSESSMENT AND PLAN:  Type 2 diabetes mellitus without complication, without long-term current use of insulin (HCC)  Vitamin D deficiency - Plan: Vitamin D, Ergocalciferol, (DRISDOL) 1.25 MG (50000 UT) CAPS capsule  Class 3 severe obesity with serious comorbidity and body mass index (BMI) of 40.0 to 44.9 in adult, unspecified obesity type ([place])  PLAN:  Diabetes II [name] has been given extensive diabetes education by myself today  including ideal fasting and post-prandial blood glucose readings, individual ideal Hgb A1c goals and hypoglycemia prevention. We discussed the importance of good blood sugar control to decrease the likelihood of diabetic complications such as nephropathy, neuropathy, limb loss, blindness, coronary artery disease, and death. We discussed the importance of intensive lifestyle modification including diet, exercise and weight loss as the first line treatment for diabetes. Iver will continue taking her diabetes medications and will follow up at the agreed upon time.  Vitamin D Deficiency [name] was informed that low vitamin D levels contributes to fatigue and are associated with obesity, breast, and colon cancer. [name] agrees to continue to take prescription Vit D @50 ,000 IU every week #4 with no refills and she will follow up for routine testing of vitamin D, at least 2-3 times per year. She was informed of the risk of over-replacement of vitamin D and agrees to not increase her dose unless she discusses this with [name] first. [name] agrees to follow up with our clinic in 2 weeks.  Obesity [name] is currently in the action stage of change. As such, her goal is to continue with weight loss efforts She has agreed to follow the Category 2 plan [name] has been instructed to work up to a goal of 150 minutes of combined cardio and strengthening exercise per week for weight loss and overall health benefits. We discussed the following Behavioral Modification Strategies today: increase H2O intake, no skipping meals (will not skip breakfast), keeping healthy foods in the home, increasing lean protein intake, decreasing simple carbohydrates, increasing vegetables, decrease eating out and work on meal planning and  intentional eating  [name] has agreed to follow up with our clinic in 2 weeks. She was informed of the importance of frequent follow up visits to maximize her success with intensive lifestyle modifications for  her multiple health conditions.  ALLERGIES: Allergies  Allergen Reactions  . Penicillins Swelling and Other (See Comments)    Has patient had a PCN reaction causing immediate rash, facial/tongue/throat swelling, SOB or lightheadedness with hypotension: YES Has patient had a PCN reaction causing severe rash involving mucus membranes or skin necrosis: NO Has patient had a PCN reaction that required hospitalization NO Has patient had a PCN reaction occurring within the last 10 years: NO If all of the above answers are "NO", then may proceed with Cephalosporin use.  [name] Hydrocodone-Acetaminophen Itching    confusion  . Latex Rash  . Sulfa Antibiotics Diarrhea, Itching and Rash    MEDICATIONS: Current Outpatient Medications on File Prior to Visit  Medication Sig Dispense Refill  . acetaminophen (TYLENOL) 325 MG tablet Take 1-2 tablets (325-650 mg total) by mouth every 6 (six) hours as needed for mild pain (pain score 1-3 or temp > 100.5). 60 tablet 0  . albuterol (PROVENTIL HFA;VENTOLIN HFA) 108 (90 Base) MCG/ACT inhaler Inhale 2 puffs into the lungs every 6 (six) hours as needed for wheezing or shortness of breath.    [name] amLODipine (NORVASC) 5 MG tablet Take 5 mg by mouth daily.  6  . atorvastatin (LIPITOR) 40 MG tablet Take 40 mg by mouth at bedtime.     . carvedilol (COREG CR) 10 MG 24 hr capsule Take 10 mg by mouth daily.    . furosemide (LASIX) 40 MG tablet Take 40 mg by mouth daily.    . isosorbide dinitrate (ISORDIL) 20 MG tablet Take 20 mg by mouth 2 (two) times daily.     [name] lidocaine (LIDODERM) 5 % Place 1 patch onto the skin daily. Remove & Discard patch within 12 hours or as directed by MD (Patient taking differently: Place 1 patch onto the skin daily as needed (for pain). Remove & Discard patch within 12 hours or as directed by MD) 30 patch 0  . lisinopril (PRINIVIL,ZESTRIL) 20 MG tablet Take 20 mg by mouth daily.     [name] loperamide (IMODIUM A-D) 2 MG tablet Take 4 mg by mouth 4 (four)  times daily as needed for diarrhea or loose stools.     . meloxicam (MOBIC) 15 MG tablet 1 po daily prn 30 tablet 0  . MYRBETRIQ 25 MG TB24 tablet Take 1 tablet (25 mg total) by mouth daily. 30 tablet 1  . omeprazole (PRILOSEC) 20 MG capsule Take 20 mg by mouth 2 (two) times daily.    [name] oxyCODONE-acetaminophen (PERCOCET/ROXICET) 5-325 MG tablet Take 2 tablets by mouth every 6 (six) hours as needed for severe pain. 12 tablet 0  . potassium chloride (KLOR-CON M10) 10 MEQ tablet Take 1 tablet (10 mEq total) by mouth 2 (two) times daily. 60 tablet 1  . sertraline (ZOLOFT) 100 MG tablet Take 100 mg by mouth daily.     . SitaGLIPtin-MetFORMIN HCl (JANUMET XR) 50-500 MG TB24 Take 50-500 mg by mouth daily. 30 tablet 1  . [DISCONTINUED] KOMBIGLYZE XR 5-500 MG TB24 Take 1 tablet by mouth daily.      No current facility-administered medications on file prior to visit.     PAST MEDICAL HISTORY: Past Medical History:  Diagnosis Date  . Anginal pain ([place])    a. NL cath in 2008;  b. Myoview 03/2011: dec uptake along mid anterior wall on stress imaging -> ? attenuation vs. ischemia, EF 65%;  c. Echo 04/2011: EF 55-60%, no RWMA, Gr 2 dd  . Anxiety   . Arthritis   . Asthma   . Back pain   . Bone cancer ([place])   . Cancer ([place])   . Chest pain   . CHF (congestive heart failure) ([place])   . CHF (congestive heart failure) ([place])   . Depression   . Diabetes mellitus   . Drug use   . Dyspnea    with exertion  . Dyspnea   . Frequent urination   . GERD (gastroesophageal reflux disease)   . Glaucoma   . HLD (hyperlipidemia)   . Hypertension   . IBS (irritable bowel syndrome)   . Joint pain   . Lactose intolerance   . Leg edema   . 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 Obesity   . Palpitations   . Pneumonia 05/2016   double  . Pulmonary edema   . Pulmonary embolism ([place])    a. 2008 -> coumadin x 6 mos.  . Rheumatoid arthritis ([place])   . Sleep apnea    on CPAP 02/2018  . TIA  (transient ischemic attack)   . Urinary urgency     PAST SURGICAL HISTORY: Past Surgical History:  Procedure Laterality Date  . ABDOMINAL HYSTERECTOMY  2005  . APPENDECTOMY    . CARDIAC CATHETERIZATION     Normal  . CARDIAC CATHETERIZATION N/A 09/30/2014   Procedure: Left Heart Cath and Coronary Angiography;  Surgeon: Sherren Mocha, MD;  Location: [facility];  Service: Cardiovascular;  Laterality: N/A;  . LAPAROSCOPIC APPENDECTOMY N/A 06/03/2012   Procedure: APPENDECTOMY LAPAROSCOPIC;  Surgeon: Stark Klein, MD;  Location: [place];  Service: General;  Laterality: N/A;  . Left knee surgery  2008  . LEG SURGERY    . TONSILLECTOMY    . TOTAL HIP ARTHROPLASTY Left 06/11/2018   Procedure: LEFT TOTAL HIP ARTHROPLASTY ANTERIOR APPROACH;  Surgeon: Meredith Pel, MD;  Location: [place];  Service: Orthopedics;  Laterality: Left;  . TOTAL KNEE ARTHROPLASTY Left 08/22/2016   Procedure: TOTAL KNEE ARTHROPLASTY;  Surgeon: Meredith Pel, MD;  Location: [place];  Service: Orthopedics;  Laterality: Left;  . TUBAL LIGATION  1989    SOCIAL HISTORY: Social History   Tobacco Use  . Smoking status: Former Smoker    Packs/day: 0.50    Years: 15.00    Pack years: 7.50    Types: Cigarettes    Quit date: 04/24/1985    Years since quitting: 33.6  . Smokeless tobacco: Never Used  Substance Use Topics  . Alcohol use: No    Alcohol/week: 0.0 standard drinks  . Drug use: Not Currently    Types: Marijuana    FAMILY HISTORY: Family History  Problem Relation Age of Onset  . Emphysema Mother   . Arthritis Mother   . Heart failure Mother        alive @ 75  . Stroke Mother   . Diabetes Mother   . Hypertension Mother   . Hyperlipidemia Mother   . Depression Mother   . Anxiety disorder Mother   . Asthma Brother   . Heart disease Father        died @ 2's.  . Stroke Father   . Diabetes Father   . Hyperlipidemia Father   . Hypertension Father   .  Bipolar disorder Father   . Sleep apnea  Father   . Alcoholism Father   . Drug abuse Father   . Diabetes Sister   . Heart disease Paternal Grandfather   . Colon cancer Maternal Grandfather   . Breast cancer Maternal Aunt     ROS: Review of Systems  Constitutional: Negative for weight loss.  Gastrointestinal: Negative for nausea and vomiting.  Musculoskeletal:       Negative for muscle weakness  Endo/Heme/Allergies:       Negative for hypoglycemia    PHYSICAL EXAM: Pt in no acute distress  RECENT LABS AND TESTS: BMET    Component Value Date/Time   NA 146 (H) 11/18/2018 2043   NA 144 03/04/2018 1152   K 3.7 11/18/2018 2043   CL 112 (H) 11/18/2018 2043   CO2 26 11/18/2018 2043   GLUCOSE 140 (H) 11/18/2018 2043   BUN 11 11/18/2018 2043   BUN 17 03/04/2018 1152   CREATININE 0.92 11/18/2018 2043   CALCIUM 8.6 (L) 11/18/2018 2043   GFRNONAA >60 11/18/2018 2043   GFRAA >60 11/18/2018 2043   Lab Results  Component Value Date   HGBA1C 6.6 (H) 05/22/2018   HGBA1C 6.8 (H) 03/04/2018   HGBA1C 8.8 (H) 11/20/2017   HGBA1C 7.4 (H) 08/11/2016   HGBA1C 6.6 (H) 05/21/2016   Lab Results  Component Value Date   INSULIN 20.2 03/04/2018   INSULIN 53.9 (H) 11/20/2017   CBC    Component Value Date/Time   WBC 5.9 11/18/2018 2043   RBC 4.43 11/18/2018 2043   HGB 11.7 (L) 11/18/2018 2043   HGB 12.8 11/20/2017 0925   HCT 37.2 11/18/2018 2043   HCT 40.6 11/20/2017 0925   PLT 212 11/18/2018 2043   MCV 84.0 11/18/2018 2043   MCV 85 11/20/2017 0925   MCH 26.4 11/18/2018 2043   MCHC 31.5 11/18/2018 2043   RDW 15.6 (H) 11/18/2018 2043   RDW 13.9 11/20/2017 0925   LYMPHSABS 2.7 06/12/2018 1321   LYMPHSABS 2.1 11/20/2017 0925   MONOABS 0.8 06/12/2018 1321   EOSABS 0.2 06/12/2018 1321   EOSABS 0.4 11/20/2017 0925   BASOSABS 0.0 06/12/2018 1321   BASOSABS 0.0 11/20/2017 0925   Iron/TIBC/Ferritin/ %Sat No results found for: IRON, TIBC, FERRITIN, IRONPCTSAT Lipid Panel     Component Value Date/Time   CHOL 162  03/04/2018 1152   TRIG 117 03/04/2018 1152   HDL 50 03/04/2018 1152   CHOLHDL 4.2 04/23/2011 0830   CHOLHDL 4.3 04/23/2011 0830   VLDL 29 04/23/2011 0830   VLDL 29 04/23/2011 0830   LDLCALC 89 03/04/2018 1152   Hepatic Function Panel     Component Value Date/Time   PROT 6.4 03/04/2018 1152   ALBUMIN 4.1 03/04/2018 1152   AST 19 03/04/2018 1152   ALT 40 (H) 03/04/2018 1152   ALKPHOS 61 03/04/2018 1152   BILITOT 0.4 03/04/2018 1152   BILIDIR 0.1 05/22/2016 0913   IBILI 0.6 05/22/2016 0913      Component Value Date/Time   TSH 0.638 11/20/2017 0925   TSH 0.112 (L) 05/24/2016 0629   TSH 0.345 (L) 06/18/2014 0930   TSH 0.167 (L) 10/28/2012 0144     Ref. Range 03/04/2018 11:52  Vitamin D, 25-Hydroxy Latest Ref Range: 30.0 - 100.0 ng/mL 34.5    I, Doreene Nest, am acting as Location manager for [employer]. Owens Shark, DO  I have reviewed the above documentation for accuracy and completeness, and I agree with the above. -Jearld Lesch, DO

## 2018-12-16 ENCOUNTER — Other Ambulatory Visit: Payer: Self-pay | Admitting: Family

## 2018-12-16 DIAGNOSIS — Z1231 Encounter for screening mammogram for malignant neoplasm of breast: Secondary | ICD-10-CM

## 2018-12-18 ENCOUNTER — Encounter: Payer: Self-pay | Admitting: *Deleted

## 2018-12-23 ENCOUNTER — Ambulatory Visit: Payer: Self-pay

## 2018-12-23 ENCOUNTER — Ambulatory Visit (INDEPENDENT_AMBULATORY_CARE_PROVIDER_SITE_OTHER): Payer: Medicare Other | Admitting: Physical Medicine and Rehabilitation

## 2018-12-23 ENCOUNTER — Encounter: Payer: Self-pay | Admitting: Physical Medicine and Rehabilitation

## 2018-12-23 VITALS — BP 150/89 | HR 74

## 2018-12-23 DIAGNOSIS — M5416 Radiculopathy, lumbar region: Secondary | ICD-10-CM | POA: Diagnosis not present

## 2018-12-23 MED ORDER — BETAMETHASONE SOD PHOS & ACET 6 (3-3) MG/ML IJ SUSP
12.0000 mg | Freq: Once | INTRAMUSCULAR | Status: AC
  Administered 2018-12-23: 12 mg

## 2018-12-23 NOTE — Progress Notes (Signed)
 .  Numeric Pain Rating Scale and Functional Assessment Average Pain 8   In the last MONTH (on 0-10 scale) has pain interfered with the following?  1. General activity like being  able to carry out your everyday physical activities such as walking, climbing stairs, carrying groceries, or moving a chair?  Rating(7)   +Driver, -BT, -Dye Allergies.  

## 2018-12-23 NOTE — Procedures (Signed)
Lumbosacral Transforaminal Epidural Steroid Injection - Sub-Pedicular Approach with Fluoroscopic Guidance  Patient: [name]      Date of Birth: August 02, 1960 MRN: [id number] PCP: Tsosie Billing, MD (Inactive)      Visit Date: 12/23/2018   Universal Protocol:    Date/Time: 12/23/2018  Consent Given By: the patient  Position: PRONE  Additional Comments: Vital signs were monitored before and after the procedure. Patient was prepped and draped in the usual sterile fashion. The correct patient, procedure, and site was verified.   Injection Procedure Details:  Procedure Site One Meds Administered:  Meds ordered this encounter  Medications  . betamethasone acetate-betamethasone sodium phosphate (CELESTONE) injection 12 mg    Laterality: Left  Location/Site:  L4-L5  Needle size: 22 G  Needle type: Spinal  Needle Placement: Transforaminal  Findings:    -Comments: Excellent flow of contrast along the nerve and into the epidural space.  Procedure Details: After squaring off the end-plates to get a true AP view, the C-arm was positioned so that an oblique view of the foramen as noted above was visualized. The target area is just inferior to the "nose of the scotty dog" or sub pedicular. The soft tissues overlying this structure were infiltrated with 2-3 ml. of 1% Lidocaine without Epinephrine.  The spinal needle was inserted toward the target using a "trajectory" view along the fluoroscope beam.  Under AP and lateral visualization, the needle was advanced so it did not puncture dura and was located close the 6 O'Clock position of the pedical in AP tracterory. Biplanar projections were used to confirm position. Aspiration was confirmed to be negative for CSF and/or blood. A 1-2 ml. volume of Isovue-250 was injected and flow of contrast was noted at each level. Radiographs were obtained for documentation purposes.   After attaining the desired flow of contrast  documented above, a 0.5 to 1.0 ml test dose of 0.25% Marcaine was injected into each respective transforaminal space.  The patient was observed for 90 seconds post injection.  After no sensory deficits were reported, and normal lower extremity motor function was noted,   the above injectate was administered so that equal amounts of the injectate were placed at each foramen (level) into the transforaminal epidural space.   Additional Comments:  The patient tolerated the procedure well Dressing: 2 x 2 sterile gauze and Band-Aid    Post-procedure details: Patient was observed during the procedure. Post-procedure instructions were reviewed.  Patient left the clinic in stable condition.

## 2018-12-23 NOTE — Progress Notes (Signed)
[name] - 58 y.o. female MRN [id number]  Date of birth: 1961/01/30  Office Visit Note: Visit Date: 12/23/2018 PCP: Tsosie Billing, MD (Inactive) Referred by: Meredith Pel, MD  Subjective: Chief Complaint  Patient presents with  . Lower Back - Pain  . Left Thigh - Pain  . Left Hip - Pain   HPI:  [name] is a 58 y.o. female who comes in today For planned left L4 transforaminal epidural steroid injection as requested by Dr. Anderson Malta.  Patient is having low back and left hip and leg pain.  She is having some pain in the left groin as well.  She reports his pain is a burning type sensation.  She reports this started around May of this year.  She had prior total hip replacement.  I seen her in the past for intra-articular hip injection which was diagnostic prior to the replacement.  She does have MRI of the lumbar spine showing multifactorial stenosis at L4-5 which is moderate to severe.  There is epidural lipomatosis causing some narrowing as well as arthritic changes and worsening thickening of the ligamentum flavum.  ROS Otherwise per HPI.  Assessment & Plan: Visit Diagnoses:  1. Lumbar radiculopathy     Plan: No additional findings.   Meds & Orders:  Meds ordered this encounter  Medications  . betamethasone acetate-betamethasone sodium phosphate (CELESTONE) injection 12 mg    Orders Placed This Encounter  Procedures  . XR C-ARM NO REPORT  . Epidural Steroid injection    Follow-up: Return if symptoms worsen or fail to improve.   Procedures: No procedures performed  Lumbosacral Transforaminal Epidural Steroid Injection - Sub-Pedicular Approach with Fluoroscopic Guidance  Patient: [name]      Date of Birth: Dec 05, 1960 MRN: [id number] PCP: Tsosie Billing, MD (Inactive)      Visit Date: 12/23/2018   Universal Protocol:    Date/Time: 12/23/2018  Consent Given By: the patient  Position: PRONE   Additional Comments: Vital signs were monitored before and after the procedure. Patient was prepped and draped in the usual sterile fashion. The correct patient, procedure, and site was verified.   Injection Procedure Details:  Procedure Site One Meds Administered:  Meds ordered this encounter  Medications  . betamethasone acetate-betamethasone sodium phosphate (CELESTONE) injection 12 mg    Laterality: Left  Location/Site:  L4-L5  Needle size: 22 G  Needle type: Spinal  Needle Placement: Transforaminal  Findings:    -Comments: Excellent flow of contrast along the nerve and into the epidural space.  Procedure Details: After squaring off the end-plates to get a true AP view, the C-arm was positioned so that an oblique view of the foramen as noted above was visualized. The target area is just inferior to the "nose of the scotty dog" or sub pedicular. The soft tissues overlying this structure were infiltrated with 2-3 ml. of 1% Lidocaine without Epinephrine.  The spinal needle was inserted toward the target using a "trajectory" view along the fluoroscope beam.  Under AP and lateral visualization, the needle was advanced so it did not puncture dura and was located close the 6 O'Clock position of the pedical in AP tracterory. Biplanar projections were used to confirm position. Aspiration was confirmed to be negative for CSF and/or blood. A 1-2 ml. volume of Isovue-250 was injected and flow of contrast was noted at each level. Radiographs were obtained for documentation purposes.   After attaining the desired flow of contrast  documented above, a 0.5 to 1.0 ml test dose of 0.25% Marcaine was injected into each respective transforaminal space.  The patient was observed for 90 seconds post injection.  After no sensory deficits were reported, and normal lower extremity motor function was noted,   the above injectate was administered so that equal amounts of the injectate were placed at each  foramen (level) into the transforaminal epidural space.   Additional Comments:  The patient tolerated the procedure well Dressing: 2 x 2 sterile gauze and Band-Aid    Post-procedure details: Patient was observed during the procedure. Post-procedure instructions were reviewed.  Patient left the clinic in stable condition.    Clinical History: MRI LUMBAR SPINE WITHOUT CONTRAST  TECHNIQUE: Multiplanar, multisequence MR imaging of the lumbar spine was performed. No intravenous contrast was administered.  COMPARISON:  Plain films lumbar spine 04/27/2014.  FINDINGS: Segmentation:  Standard.  Alignment: Facet degenerative disease results in 0.3 cm anterolisthesis L4 on L5 and 0.6 cm anterolisthesis L5 on S1. Convex left scoliosis is noted.  Vertebrae:  No fracture or worrisome marrow lesion.  Conus medullaris and cauda equina: Conus extends to the L2 level. Conus and cauda equina appear normal.  Paraspinal and other soft tissues: Negative.  Disc levels:  The T9-10, T10-11 and T11-12 are imaged in the sagittal plane only. The central canal and foramina appear open at each level with mild disc bulging seen at T10-11 and T11-12.  T12-L1: Moderate facet arthropathy. Left paravertebral endplate spur. No central canal or foraminal stenosis.  L1-2: Moderate facet arthropathy.  Otherwise negative.  L2-3: Moderate facet arthropathy. Dorsal epidural fat is prominent. The central canal and foramina are open.  L3-4: Moderate facet arthropathy. Dorsal and left-side epidural fat is prominent. The central canal and right foramen are open. Mild left foraminal narrowing noted.  L4-5: The disc is uncovered with a shallow bulge. There is advanced bilateral facet arthropathy and bulky ligamentum flavum thickening. Epidural fat is somewhat prominent. Moderate to moderately severe compression of the thecal sac is identified. Mild to moderate foraminal narrowing is worse  on the left.  L5-S1: Advanced bilateral facet degenerative disease. The disc is uncovered without bulging. The central canal and foramina are open.  IMPRESSION: Bulky ligamentum flavum thickening, shallow disc bulge and prominent epidural fat cause moderate to moderately severe compression of the thecal sac at L4-5. 0.3 cm facet mediated anterolisthesis is seen at this level.  Advanced facet arthropathy at L5-S1 results in 0.6 cm anterolisthesis. The central canal and foramina are open.   Electronically Signed   By: Inge Rise M.D.   On: 10/27/2017 13:35  Cervical spine MR 05/30/1014 There is reversal of the normal cervical lordosis with apex at C4-5. Trace anterolisthesis of C4 on C5 present. Overall, alignment is stable from previous MRI from 2014. Vertebral body heights are maintained. Signal intensity within the vertebral body bone marrow is normal. No focal osseous lesion  C4-5: Left eccentric circumferential degenerative disc osteophyte complex with left-sided uncovertebral spurring and mild left facet hypertrophy. There is resultant moderate to severe left foraminal narrowing. Mild effacement of the left ventral thecal sac present without significant canal stenosis. Mild right foraminal narrowing present as well.  C5-6: Left eccentric degenerative disc osteophyte with uncovertebral hypertrophy and facet arthrosis. There is fairly severe left sided foraminal narrowing. Mild to moderate right foraminal stenosis present as well. No central canal stenosis.  C6-7: Mild diffuse degenerative disc osteophyte complex, a centric to the right, with right-sided uncovertebral spurring and hypertrophy.  There is moderate to severe right foraminal stenosis with mild left foraminal narrowing. No significant central canal stenosis.  C7-T1: Mild left-sided facet hypertrophy present. No significant canal or foraminal stenosis.  Overall very similar findings to 2014      Objective:  VS:  HT:    WT:   BMI:     BP:(!) 150/89  HR:74bpm  TEMP: ( )  RESP:  Physical Exam  Ortho Exam Imaging: Xr C-arm No Report  Result Date: 12/23/2018 Please see Notes tab for imaging impression.

## 2018-12-24 ENCOUNTER — Ambulatory Visit (INDEPENDENT_AMBULATORY_CARE_PROVIDER_SITE_OTHER): Payer: Medicare Other | Admitting: Bariatrics

## 2018-12-26 ENCOUNTER — Ambulatory Visit (INDEPENDENT_AMBULATORY_CARE_PROVIDER_SITE_OTHER): Payer: Medicare Other | Admitting: Bariatrics

## 2018-12-26 ENCOUNTER — Other Ambulatory Visit: Payer: Self-pay

## 2018-12-26 ENCOUNTER — Encounter (INDEPENDENT_AMBULATORY_CARE_PROVIDER_SITE_OTHER): Payer: Self-pay | Admitting: Bariatrics

## 2018-12-26 VITALS — BP 145/82 | HR 73 | Temp 98.4°F | Ht <= 58 in | Wt 211.0 lb

## 2018-12-26 DIAGNOSIS — E559 Vitamin D deficiency, unspecified: Secondary | ICD-10-CM | POA: Diagnosis not present

## 2018-12-26 DIAGNOSIS — E119 Type 2 diabetes mellitus without complications: Secondary | ICD-10-CM

## 2018-12-26 DIAGNOSIS — Z6841 Body Mass Index (BMI) 40.0 and over, adult: Secondary | ICD-10-CM | POA: Diagnosis not present

## 2018-12-26 MED ORDER — VITAMIN D (ERGOCALCIFEROL) 1.25 MG (50000 UNIT) PO CAPS: 50000.0000 [IU] | ORAL_CAPSULE | ORAL | 0 refills | Status: DC

## 2018-12-26 NOTE — Progress Notes (Signed)
Office: [phone]  /  Fax: [phone]   HPI:   Chief Complaint: OBESITY [name] is here to discuss her progress with her obesity treatment plan. She is on the Category 2 plan and is following her eating plan approximately 70% of the time. She states she is walking 20 minutes 2-3 times per week. [name] has gone up 7 lbs since her last in-office visit 07/08/2018, but she has done well overall. She states she has stopped with sweets.  Her weight is 211 lb (95.7 kg) today and has had a weight gain of 7 lbs since her last in-office visit. She has lost 14 lbs since starting treatment with [name].  Diabetes II [name] has a diagnosis of diabetes type II and is taking Janumet XR. [name] states fasting blood sugars range between 100 and 140's. Last A1c was 6.6 on 05/22/2018. She has been working on intensive lifestyle modifications including diet, exercise, and weight loss to help control her blood glucose levels.  Vitamin D deficiency [name] has a diagnosis of Vitamin D deficiency. She is currently taking prescription Vit D and denies nausea, vomiting or muscle weakness.  ASSESSMENT AND PLAN:  Type 2 diabetes mellitus without complication, without long-term current use of insulin ([place]) - Plan: Hemoglobin A1c, Insulin, random, Lipid Panel With LDL/HDL Ratio  Vitamin D deficiency - Plan: VITAMIN D 25 Hydroxy (Vit-D Deficiency, Fractures), Vitamin D, Ergocalciferol, (DRISDOL) 1.25 MG (50000 UT) CAPS capsule  Class 3 severe obesity with serious comorbidity and body mass index (BMI) of 40.0 to 44.9 in adult, unspecified obesity type ([place])  PLAN:  Diabetes II [name] has been given extensive diabetes education by myself today including ideal fasting and post-prandial blood glucose readings, individual ideal HgA1c goals  and hypoglycemia prevention. We discussed the importance of good blood sugar control to decrease the likelihood of diabetic complications such as nephropathy, neuropathy, limb loss,  blindness, coronary artery disease, and death. We discussed the importance of intensive lifestyle modification including diet, exercise and weight loss as the first line treatment for diabetes. [name] agrees to continue her diabetes medications and will follow-up at the agreed upon time.  Vitamin D Deficiency [name] was informed that low Vitamin D levels contributes to fatigue and are associated with obesity, breast, and colon cancer. She agrees to continue to take prescription Vit D @ 50,000 IU every week #4 with 0 refills and will follow-up for routine testing of Vitamin D, at least 2-3 times per year. She was informed of the risk of over-replacement of Vitamin D and agrees to not increase her dose unless she discusses this with [name] first. [name] agrees to follow-up with our clinic in 2 weeks.  Obesity [name] is currently in the action stage of change. As such, her goal is to continue with weight loss efforts. She has agreed to follow the Category 2 plan and Pescatarian (can alternate). [name] will work on meal planning, intentional eating, and stress eating. Handouts were given on Seasonings (home, store-bought). [name] has been instructed to work up to a goal of 150 minutes of combined cardio and strengthening exercise per week for weight loss and overall health benefits. We discussed the following Behavioral Modification Strategies today: increasing lean protein intake, decreasing simple carbohydrates, increasing vegetables, increase H20 intake, decrease eating out, no skipping meals, work on meal planning and easy cooking plans, keeping healthy foods in the home, and planning for success.  [name] has agreed to follow-up with our clinic in 2 weeks. She was informed of the importance  of frequent follow-up visits to maximize her success with intensive lifestyle modifications for her multiple health conditions.  ALLERGIES: Allergies  Allergen Reactions  . Penicillins Swelling and Other (See  Comments)    Has patient had a PCN reaction causing immediate rash, facial/tongue/throat swelling, SOB or lightheadedness with hypotension: YES Has patient had a PCN reaction causing severe rash involving mucus membranes or skin necrosis: NO Has patient had a PCN reaction that required hospitalization NO Has patient had a PCN reaction occurring within the last 10 years: NO If all of the above answers are "NO", then may proceed with Cephalosporin use.  [name] Hydrocodone-Acetaminophen Itching    confusion  . Latex Rash  . Sulfa Antibiotics Diarrhea, Itching and Rash    MEDICATIONS: Current Outpatient Medications on File Prior to Visit  Medication Sig Dispense Refill  . acetaminophen (TYLENOL) 325 MG tablet Take 1-2 tablets (325-650 mg total) by mouth every 6 (six) hours as needed for mild pain (pain score 1-3 or temp > 100.5). 60 tablet 0  . albuterol (PROVENTIL HFA;VENTOLIN HFA) 108 (90 Base) MCG/ACT inhaler Inhale 2 puffs into the lungs every 6 (six) hours as needed for wheezing or shortness of breath.    [name] amLODipine (NORVASC) 5 MG tablet Take 5 mg by mouth daily.  6  . atorvastatin (LIPITOR) 40 MG tablet Take 40 mg by mouth at bedtime.     . carvedilol (COREG CR) 10 MG 24 hr capsule Take 10 mg by mouth daily.    . furosemide (LASIX) 40 MG tablet Take 40 mg by mouth daily.    . isosorbide dinitrate (ISORDIL) 20 MG tablet Take 20 mg by mouth 2 (two) times daily.     [name] lidocaine (LIDODERM) 5 % Place 1 patch onto the skin daily. Remove & Discard patch within 12 hours or as directed by MD (Patient taking differently: Place 1 patch onto the skin daily as needed (for pain). Remove & Discard patch within 12 hours or as directed by MD) 30 patch 0  . lisinopril (PRINIVIL,ZESTRIL) 20 MG tablet Take 20 mg by mouth daily.     [name] loperamide (IMODIUM A-D) 2 MG tablet Take 4 mg by mouth 4 (four) times daily as needed for diarrhea or loose stools.     . meloxicam (MOBIC) 15 MG tablet 1 po daily prn 30 tablet 0   . MYRBETRIQ 25 MG TB24 tablet Take 1 tablet (25 mg total) by mouth daily. 30 tablet 1  . omeprazole (PRILOSEC) 20 MG capsule Take 20 mg by mouth 2 (two) times daily.    [name] oxyCODONE-acetaminophen (PERCOCET/ROXICET) 5-325 MG tablet Take 2 tablets by mouth every 6 (six) hours as needed for severe pain. 12 tablet 0  . potassium chloride (KLOR-CON M10) 10 MEQ tablet Take 1 tablet (10 mEq total) by mouth 2 (two) times daily. 60 tablet 1  . sertraline (ZOLOFT) 100 MG tablet Take 100 mg by mouth daily.     . SitaGLIPtin-MetFORMIN HCl (JANUMET XR) 50-500 MG TB24 Take 50-500 mg by mouth daily. 30 tablet 1  . [DISCONTINUED] KOMBIGLYZE XR 5-500 MG TB24 Take 1 tablet by mouth daily.      No current facility-administered medications on file prior to visit.     PAST MEDICAL HISTORY: Past Medical History:  Diagnosis Date  . Anginal pain ([place])    a. NL cath in 2008;  b. Myoview 03/2011: dec uptake along mid anterior wall on stress imaging -> ? attenuation vs. ischemia, EF 65%;  c. Echo  04/2011: EF 55-60%, no RWMA, Gr 2 dd  . Anxiety   . Arthritis   . Asthma   . Back pain   . Bone cancer ([place])   . Cancer ([place])   . Chest pain   . CHF (congestive heart failure) ([place])   . CHF (congestive heart failure) ([place])   . Depression   . Diabetes mellitus   . Drug use   . Dyspnea    with exertion  . Dyspnea   . Frequent urination   . GERD (gastroesophageal reflux disease)   . Glaucoma   . HLD (hyperlipidemia)   . Hypertension   . IBS (irritable bowel syndrome)   . Joint pain   . Lactose intolerance   . Leg edema   . 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 Obesity   . Palpitations   . Pneumonia 05/2016   double  . Pulmonary edema   . Pulmonary embolism ([place])    a. 2008 -> coumadin x 6 mos.  . Rheumatoid arthritis ([place])   . Sleep apnea    on CPAP 02/2018  . TIA (transient ischemic attack)   . Urinary urgency     PAST SURGICAL HISTORY: Past Surgical History:  Procedure  Laterality Date  . ABDOMINAL HYSTERECTOMY  2005  . APPENDECTOMY    . CARDIAC CATHETERIZATION     Normal  . CARDIAC CATHETERIZATION N/A 09/30/2014   Procedure: Left Heart Cath and Coronary Angiography;  Surgeon: Sherren Mocha, MD;  Location: [facility];  Service: Cardiovascular;  Laterality: N/A;  . LAPAROSCOPIC APPENDECTOMY N/A 06/03/2012   Procedure: APPENDECTOMY LAPAROSCOPIC;  Surgeon: Stark Klein, MD;  Location: [facility];  Service: General;  Laterality: N/A;  . Left knee surgery  2008  . LEG SURGERY    . TONSILLECTOMY    . TOTAL HIP ARTHROPLASTY Left 06/11/2018   Procedure: LEFT TOTAL HIP ARTHROPLASTY ANTERIOR APPROACH;  Surgeon: Meredith Pel, MD;  Location: [facility];  Service: Orthopedics;  Laterality: Left;  . TOTAL KNEE ARTHROPLASTY Left 08/22/2016   Procedure: TOTAL KNEE ARTHROPLASTY;  Surgeon: Meredith Pel, MD;  Location: [facility];  Service: Orthopedics;  Laterality: Left;  . TUBAL LIGATION  1989    SOCIAL HISTORY: Social History   Tobacco Use  . Smoking status: Former Smoker    Packs/day: 0.50    Years: 15.00    Pack years: 7.50    Types: Cigarettes    Quit date: 04/24/1985    Years since quitting: 33.6  . Smokeless tobacco: Never Used  Substance Use Topics  . Alcohol use: No    Alcohol/week: 0.0 standard drinks  . Drug use: Not Currently    Types: Marijuana    FAMILY HISTORY: Family History  Problem Relation Age of Onset  . Emphysema Mother   . Arthritis Mother   . Heart failure Mother        alive @ 49  . Stroke Mother   . Diabetes Mother   . Hypertension Mother   . Hyperlipidemia Mother   . Depression Mother   . Anxiety disorder Mother   . Asthma Brother   . Heart disease Father        died @ 7's.  . Stroke Father   . Diabetes Father   . Hyperlipidemia Father   . Hypertension Father   . Bipolar disorder Father   . Sleep apnea Father   . Alcoholism Father   . Drug abuse Father   .  Diabetes Sister   . Heart disease Paternal Grandfather    . Colon cancer Maternal Grandfather   . Breast cancer Maternal Aunt    ROS: Review of Systems  Gastrointestinal: Negative for nausea and vomiting.  Musculoskeletal:       Negative for muscle weakness.   PHYSICAL EXAM: Blood pressure (!) 145/82, pulse 73, temperature 98.4 F (36.9 C), temperature source Oral, height 4\' 10"  (1.473 m), weight 211 lb (95.7 kg), SpO2 97 %. Body mass index is 44.1 kg/m. Physical Exam Vitals signs reviewed.  Constitutional:      Appearance: Normal appearance. She is obese.  Cardiovascular:     Rate and Rhythm: Normal rate.     Pulses: Normal pulses.  Pulmonary:     Effort: Pulmonary effort is normal.     Breath sounds: Normal breath sounds.  Musculoskeletal: Normal range of motion.  Skin:    General: Skin is warm and dry.  Neurological:     Mental Status: She is alert and oriented to person, place, and time.  Psychiatric:        Behavior: Behavior normal.   RECENT LABS AND TESTS: BMET    Component Value Date/Time   NA 146 (H) 11/18/2018 2043   NA 144 03/04/2018 1152   K 3.7 11/18/2018 2043   CL 112 (H) 11/18/2018 2043   CO2 26 11/18/2018 2043   GLUCOSE 140 (H) 11/18/2018 2043   BUN 11 11/18/2018 2043   BUN 17 03/04/2018 1152   CREATININE 0.92 11/18/2018 2043   CALCIUM 8.6 (L) 11/18/2018 2043   GFRNONAA >60 11/18/2018 2043   GFRAA >60 11/18/2018 2043   Lab Results  Component Value Date   HGBA1C 6.6 (H) 05/22/2018   HGBA1C 6.8 (H) 03/04/2018   HGBA1C 8.8 (H) 11/20/2017   HGBA1C 7.4 (H) 08/11/2016   HGBA1C 6.6 (H) 05/21/2016   Lab Results  Component Value Date   INSULIN 20.2 03/04/2018   INSULIN 53.9 (H) 11/20/2017   CBC    Component Value Date/Time   WBC 5.9 11/18/2018 2043   RBC 4.43 11/18/2018 2043   HGB 11.7 (L) 11/18/2018 2043   HGB 12.8 11/20/2017 0925   HCT 37.2 11/18/2018 2043   HCT 40.6 11/20/2017 0925   PLT 212 11/18/2018 2043   MCV 84.0 11/18/2018 2043   MCV 85 11/20/2017 0925   MCH 26.4 11/18/2018 2043    MCHC 31.5 11/18/2018 2043   RDW 15.6 (H) 11/18/2018 2043   RDW 13.9 11/20/2017 0925   LYMPHSABS 2.7 06/12/2018 1321   LYMPHSABS 2.1 11/20/2017 0925   MONOABS 0.8 06/12/2018 1321   EOSABS 0.2 06/12/2018 1321   EOSABS 0.4 11/20/2017 0925   BASOSABS 0.0 06/12/2018 1321   BASOSABS 0.0 11/20/2017 0925   Iron/TIBC/Ferritin/ %Sat No results found for: IRON, TIBC, FERRITIN, IRONPCTSAT Lipid Panel     Component Value Date/Time   CHOL 162 03/04/2018 1152   TRIG 117 03/04/2018 1152   HDL 50 03/04/2018 1152   CHOLHDL 4.2 04/23/2011 0830   CHOLHDL 4.3 04/23/2011 0830   VLDL 29 04/23/2011 0830   VLDL 29 04/23/2011 0830   LDLCALC 89 03/04/2018 1152   Hepatic Function Panel     Component Value Date/Time   PROT 6.4 03/04/2018 1152   ALBUMIN 4.1 03/04/2018 1152   AST 19 03/04/2018 1152   ALT 40 (H) 03/04/2018 1152   ALKPHOS 61 03/04/2018 1152   BILITOT 0.4 03/04/2018 1152   BILIDIR 0.1 05/22/2016 0913   IBILI 0.6 05/22/2016 0913  Component Value Date/Time   TSH 0.638 11/20/2017 0925   TSH 0.112 (L) 05/24/2016 0629   TSH 0.345 (L) 06/18/2014 0930   TSH 0.167 (L) 10/28/2012 0144   Results for ADDYLINE, GENNUSA (MRN [id number]) as of 12/26/2018 13:46  Ref. Range 03/04/2018 11:52  Vitamin D, 25-Hydroxy Latest Ref Range: 30.0 - 100.0 ng/mL 34.5   OBESITY BEHAVIORAL INTERVENTION VISIT  Today's visit was #17   Starting weight: 225 lbs Starting date: 11/20/2017 Today's weight: 211 lbs  Today's date: 12/26/2018 Total lbs lost to date: 14 At least 15 minutes were spent on discussing the following behavioral intervention visit.    12/26/2018  Height 4\' 10"  (1.473 m)  Weight 211 lb (95.7 kg)  BMI (Calculated) 44.11  BLOOD PRESSURE - SYSTOLIC [id number]  BLOOD PRESSURE - DIASTOLIC 82   Body Fat % [id number] %   ASK: We discussed the diagnosis of obesity with [name] today and [name] agreed to give [name] permission to discuss obesity behavioral modification therapy today.  ASSESS:  Kadeejah has the diagnosis of obesity and her BMI today is 44.2. [name] is in the action stage of change.   ADVISE: [name] was educated on the multiple health risks of obesity as well as the benefit of weight loss to improve her health. She was advised of the need for long term treatment and the importance of lifestyle modifications to improve her current health and to decrease her risk of future health problems.  AGREE: Multiple dietary modification options and treatment options were discussed and  [name] agreed to follow the recommendations documented in the above note.  ARRANGE: [name] was educated on the importance of frequent visits to treat obesity as outlined per CMS and USPSTF guidelines and agreed to schedule her next follow up appointment today.  Migdalia Dk, am acting as Location manager for [employer], DO  I have reviewed the above documentation for accuracy and completeness, and I agree with the above. -Jearld Lesch, DO

## 2018-12-27 LAB — LIPID PANEL WITH LDL/HDL RATIO
Cholesterol, Total: 119 mg/dL (ref 100–199)
HDL: 58 mg/dL (ref >39)
LDL Chol Calc (NIH): 41 mg/dL (ref 0–99)
LDL/HDL Ratio: 0.7 ratio (ref 0.0–3.2)
Triglycerides: 108 mg/dL (ref 0–149)
VLDL Cholesterol Cal: 20 mg/dL (ref 5–40)

## 2018-12-27 LAB — VITAMIN D 25 HYDROXY (VIT D DEFICIENCY, FRACTURES): Vit D, 25-Hydroxy: 41.8 ng/mL (ref 30.0–100.0)

## 2018-12-27 LAB — HEMOGLOBIN A1C
Est. average glucose Bld gHb Est-mCnc: 134 mg/dL
Hgb A1c MFr Bld: 6.3 % — ABNORMAL HIGH (ref 4.8–5.6)

## 2018-12-27 LAB — INSULIN, RANDOM: INSULIN: 28.1 u[IU]/mL — ABNORMAL HIGH (ref 2.6–24.9)

## 2019-01-03 ENCOUNTER — Other Ambulatory Visit: Payer: Self-pay | Admitting: Surgical

## 2019-01-03 ENCOUNTER — Telehealth: Payer: Self-pay | Admitting: Orthopedic Surgery

## 2019-01-03 MED ORDER — MELOXICAM 15 MG PO TABS: ORAL_TABLET | ORAL | 0 refills | Status: DC

## 2019-01-03 NOTE — Telephone Encounter (Signed)
Ok to rf? 

## 2019-01-03 NOTE — Telephone Encounter (Signed)
Patient called. She would like her Meloxicam refilled. Her call back number is [phone]

## 2019-01-06 ENCOUNTER — Ambulatory Visit (INDEPENDENT_AMBULATORY_CARE_PROVIDER_SITE_OTHER): Payer: Medicare Other | Admitting: Physical Medicine and Rehabilitation

## 2019-01-06 ENCOUNTER — Encounter: Payer: Self-pay | Admitting: Physical Medicine and Rehabilitation

## 2019-01-06 ENCOUNTER — Ambulatory Visit: Payer: Self-pay

## 2019-01-06 VITALS — BP 141/80 | HR 75

## 2019-01-06 DIAGNOSIS — M5412 Radiculopathy, cervical region: Secondary | ICD-10-CM | POA: Diagnosis not present

## 2019-01-06 MED ORDER — METHYLPREDNISOLONE ACETATE 80 MG/ML IJ SUSP
80.0000 mg | Freq: Once | INTRAMUSCULAR | Status: AC
  Administered 2019-01-06: 80 mg

## 2019-01-06 NOTE — Progress Notes (Signed)
[name] - 58 y.o. female MRN [id number]  Date of birth: 28-Oct-1960  Office Visit Note: Visit Date: 01/06/2019 PCP: Tsosie Billing, MD (Inactive) Referred by: No ref. provider found  Subjective: Chief Complaint  Patient presents with  . Neck - Pain  . Right Shoulder - Pain  . Left Shoulder - Pain  . Right Arm - Pain  . Left Arm - Pain   HPI:  [name] is a 58 y.o. female who comes in today For planned repeat essentially C7-T1 interlaminar epidural steroid injection.  Patient had prior injection in 2018 with good relief of symptoms.  She is continued to have multiple orthopedic complaints managed by Dr. Anderson Malta.  We seen her more recently for her lower back.  She has both axial neck pain and pain referring into both shoulders and both arms.  MRI is reviewed again below with her.  She has been working with home exercise program as well as medication management without much relief.  Her case is complicated by morbid obesity as well as type 2 diabetes.  ROS Otherwise per HPI.  Assessment & Plan: Visit Diagnoses:  1. Cervical radiculopathy     Plan: No additional findings.   Meds & Orders:  Meds ordered this encounter  Medications  . methylPREDNISolone acetate (DEPO-MEDROL) injection 80 mg    Orders Placed This Encounter  Procedures  . XR C-ARM NO REPORT  . Epidural Steroid injection    Follow-up: Return if symptoms worsen or fail to improve.   Procedures: No procedures performed  Cervical Epidural Steroid Injection - Interlaminar Approach with Fluoroscopic Guidance  Patient: [name]      Date of Birth: 05-03-1960 MRN: [id number] PCP: Tsosie Billing, MD (Inactive)      Visit Date: 01/06/2019   Universal Protocol:    Date/Time: 01/05/2010:54 AM  Consent Given By: the patient  Position: PRONE  Additional Comments: Vital signs were monitored before and after the procedure. Patient was prepped and draped in  the usual sterile fashion. The correct patient, procedure, and site was verified.   Injection Procedure Details:  Procedure Site One Meds Administered:  Meds ordered this encounter  Medications  . methylPREDNISolone acetate (DEPO-MEDROL) injection 80 mg     Laterality: Left  Location/Site: C7-T1  Needle size: 20 G  Needle type: Touhy  Needle Placement: Paramedian epidural space  Findings:  -Comments: Excellent flow of contrast into the epidural space.  Procedure Details: Using a paramedian approach from the side mentioned above, the region overlying the inferior lamina was localized under fluoroscopic visualization and the soft tissues overlying this structure were infiltrated with 4 ml. of 1% Lidocaine without Epinephrine. A # 20 gauge, Tuohy needle was inserted into the epidural space using a paramedian approach.  The epidural space was localized using loss of resistance along with lateral and contralateral oblique bi-planar fluoroscopic views.  After negative aspirate for air, blood, and CSF, a 2 ml. volume of Isovue-250 was injected into the epidural space and the flow of contrast was observed. Radiographs were obtained for documentation purposes.   The injectate was administered into the level noted above.  Additional Comments:  The patient tolerated the procedure well Dressing: 2 x 2 sterile gauze and Band-Aid    Post-procedure details: Patient was observed during the procedure. Post-procedure instructions were reviewed.  Patient left the clinic in stable condition.    Clinical History: MRI LUMBAR SPINE WITHOUT CONTRAST  TECHNIQUE: Multiplanar, multisequence MR imaging of  the lumbar spine was performed. No intravenous contrast was administered.  COMPARISON:  Plain films lumbar spine 04/27/2014.  FINDINGS: Segmentation:  Standard.  Alignment: Facet degenerative disease results in 0.3 cm anterolisthesis L4 on L5 and 0.6 cm anterolisthesis L5 on S1.  Convex left scoliosis is noted.  Vertebrae:  No fracture or worrisome marrow lesion.  Conus medullaris and cauda equina: Conus extends to the L2 level. Conus and cauda equina appear normal.  Paraspinal and other soft tissues: Negative.  Disc levels:  The T9-10, T10-11 and T11-12 are imaged in the sagittal plane only. The central canal and foramina appear open at each level with mild disc bulging seen at T10-11 and T11-12.  T12-L1: Moderate facet arthropathy. Left paravertebral endplate spur. No central canal or foraminal stenosis.  L1-2: Moderate facet arthropathy.  Otherwise negative.  L2-3: Moderate facet arthropathy. Dorsal epidural fat is prominent. The central canal and foramina are open.  L3-4: Moderate facet arthropathy. Dorsal and left-side epidural fat is prominent. The central canal and right foramen are open. Mild left foraminal narrowing noted.  L4-5: The disc is uncovered with a shallow bulge. There is advanced bilateral facet arthropathy and bulky ligamentum flavum thickening. Epidural fat is somewhat prominent. Moderate to moderately severe compression of the thecal sac is identified. Mild to moderate foraminal narrowing is worse on the left.  L5-S1: Advanced bilateral facet degenerative disease. The disc is uncovered without bulging. The central canal and foramina are open.  IMPRESSION: Bulky ligamentum flavum thickening, shallow disc bulge and prominent epidural fat cause moderate to moderately severe compression of the thecal sac at L4-5. 0.3 cm facet mediated anterolisthesis is seen at this level.  Advanced facet arthropathy at L5-S1 results in 0.6 cm anterolisthesis. The central canal and foramina are open.   Electronically Signed   By: Inge Rise M.D.   On: 10/27/2017 13:35  Cervical spine MR 05/30/1014 There is reversal of the normal cervical lordosis with apex at C4-5. Trace anterolisthesis of C4 on C5 present. Overall,  alignment is stable from previous MRI from 2014. Vertebral body heights are maintained. Signal intensity within the vertebral body bone marrow is normal. No focal osseous lesion  C4-5: Left eccentric circumferential degenerative disc osteophyte complex with left-sided uncovertebral spurring and mild left facet hypertrophy. There is resultant moderate to severe left foraminal narrowing. Mild effacement of the left ventral thecal sac present without significant canal stenosis. Mild right foraminal narrowing present as well.  C5-6: Left eccentric degenerative disc osteophyte with uncovertebral hypertrophy and facet arthrosis. There is fairly severe left sided foraminal narrowing. Mild to moderate right foraminal stenosis present as well. No central canal stenosis.  C6-7: Mild diffuse degenerative disc osteophyte complex, a centric to the right, with right-sided uncovertebral spurring and hypertrophy. There is moderate to severe right foraminal stenosis with mild left foraminal narrowing. No significant central canal stenosis.  C7-T1: Mild left-sided facet hypertrophy present. No significant canal or foraminal stenosis.  Overall very similar findings to 2014     Objective:  VS:  HT:    WT:   BMI:     BP:(!) 141/80  HR:75bpm  TEMP: ( )  RESP:  Physical Exam  Ortho Exam Imaging: No results found.

## 2019-01-06 NOTE — Progress Notes (Signed)
 .  Numeric Pain Rating Scale and Functional Assessment Average Pain 8   In the last MONTH (on 0-10 scale) has pain interfered with the following?  1. General activity like being  able to carry out your everyday physical activities such as walking, climbing stairs, carrying groceries, or moving a chair?  Rating(8)   +Driver, -BT, -Dye Allergies.  

## 2019-01-06 NOTE — Procedures (Signed)
Cervical Epidural Steroid Injection - Interlaminar Approach with Fluoroscopic Guidance  Patient: [name]      Date of Birth: [birth year] MRN: [id number] PCP: Tsosie Billing, MD (Inactive)      Visit Date: 01/06/2019   Universal Protocol:    Date/Time: 01/05/2010:54 AM  Consent Given By: the patient  Position: PRONE  Additional Comments: Vital signs were monitored before and after the procedure. Patient was prepped and draped in the usual sterile fashion. The correct patient, procedure, and site was verified.   Injection Procedure Details:  Procedure Site One Meds Administered:  Meds ordered this encounter  Medications  . methylPREDNISolone acetate (DEPO-MEDROL) injection 80 mg     Laterality: Left  Location/Site: C7-T1  Needle size: 20 G  Needle type: Touhy  Needle Placement: Paramedian epidural space  Findings:  -Comments: Excellent flow of contrast into the epidural space.  Procedure Details: Using a paramedian approach from the side mentioned above, the region overlying the inferior lamina was localized under fluoroscopic visualization and the soft tissues overlying this structure were infiltrated with 4 ml. of 1% Lidocaine without Epinephrine. A # 20 gauge, Tuohy needle was inserted into the epidural space using a paramedian approach.  The epidural space was localized using loss of resistance along with lateral and contralateral oblique bi-planar fluoroscopic views.  After negative aspirate for air, blood, and CSF, a 2 ml. volume of Isovue-250 was injected into the epidural space and the flow of contrast was observed. Radiographs were obtained for documentation purposes.   The injectate was administered into the level noted above.  Additional Comments:  The patient tolerated the procedure well Dressing: 2 x 2 sterile gauze and Band-Aid    Post-procedure details: Patient was observed during the procedure. Post-procedure instructions were  reviewed.  Patient left the clinic in stable condition.

## 2019-01-08 ENCOUNTER — Other Ambulatory Visit: Payer: Self-pay

## 2019-01-08 ENCOUNTER — Ambulatory Visit (INDEPENDENT_AMBULATORY_CARE_PROVIDER_SITE_OTHER): Payer: Medicare Other | Admitting: Family Medicine

## 2019-01-08 ENCOUNTER — Encounter (INDEPENDENT_AMBULATORY_CARE_PROVIDER_SITE_OTHER): Payer: Self-pay | Admitting: Family Medicine

## 2019-01-08 VITALS — BP 146/81 | HR 75 | Temp 98.2°F | Ht <= 58 in | Wt 209.2 lb

## 2019-01-08 DIAGNOSIS — E559 Vitamin D deficiency, unspecified: Secondary | ICD-10-CM

## 2019-01-08 DIAGNOSIS — E66813 Obesity, class 3: Secondary | ICD-10-CM

## 2019-01-08 DIAGNOSIS — I10 Essential (primary) hypertension: Secondary | ICD-10-CM | POA: Diagnosis not present

## 2019-01-08 DIAGNOSIS — Z6841 Body Mass Index (BMI) 40.0 and over, adult: Secondary | ICD-10-CM | POA: Diagnosis not present

## 2019-01-08 MED ORDER — VITAMIN D (ERGOCALCIFEROL) 1.25 MG (50000 UNIT) PO CAPS: 50000.0000 [IU] | ORAL_CAPSULE | ORAL | 0 refills | Status: DC

## 2019-01-13 NOTE — Progress Notes (Signed)
Office: [phone]  /  Fax: [phone]   HPI:   Chief Complaint: OBESITY Katheleen is here to discuss her progress with her obesity treatment plan. She is on the Category 2 plan and is following her eating plan approximately 80 % of the time. She states she is exercising 0 minutes 0 times per week. Mckenzy is sometimes bored with her meal options. She is often not hungry and she skips meals. Her dog was poisoned and passed away recently and she has been depressed so this has affected her ability to stick to plan. . Her PCP has started her on medication for depression. Her weight is 209 lb 3.2 oz (94.9 kg) today and has had a weight loss of 2 pounds over a period of 2 weeks since her last visit. She has lost 16 lbs since starting treatment with [name].  Hypertension LANAYSIA ASHABRANNER is a 58 y.o. female with hypertension. She did not take her medications this morning and her blood pressure is up this morning. It tends to run slightly high. Armahni is on Norvasc, Coreg and Lisinopril. Edwyna Ready denies chest pain or shortness of breath on exertion. She is working weight loss to help control her blood pressure with the goal of decreasing her risk of heart attack and stroke. Angelas blood pressure is not currently controlled. BP Readings from Last 3 Encounters:  01/08/19 (!) 146/81  01/06/19 (!) 141/80  12/26/18 (!) 145/82    Vitamin D deficiency Denasha has a diagnosis of vitamin D deficiency. She is currently taking vit D and her last level was not quite at goal, but it was improved. Her last vitamin D level was at 41.8 on 12/26/18. She denies nausea, vomiting or muscle weakness.  ASSESSMENT AND PLAN:  Vitamin D deficiency - Plan: Vitamin D, Ergocalciferol, (DRISDOL) 1.25 MG (50000 UT) CAPS capsule  Essential hypertension  Class 3 severe obesity with serious comorbidity and body mass index (BMI) of 40.0 to 44.9 in adult, unspecified obesity type ([place])  PLAN:  Hypertension We  discussed sodium restriction, working on healthy weight loss, and a regular exercise program as the means to achieve improved blood pressure control. Viera agreed with this plan and agreed to follow up as directed. We will continue to monitor her blood pressure as well as her progress with the above lifestyle modifications. She will take her blood pressure medication when she arrives at home. Marymar will watch for signs of hypotension as she continues her lifestyle modifications.  Vitamin D Deficiency Luella was informed that low vitamin D levels contributes to fatigue and are associated with obesity, breast, and colon cancer. Khamora agrees to continue to take prescription Vit D @50 ,000 IU every week #4 with no refills and she will follow up for routine testing of vitamin D, at least 2-3 times per year. She was informed of the risk of over-replacement of vitamin D and agrees to not increase her dose unless she discusses this with [name] first. Sarath agrees to follow up with our clinic in 2 to 3 weeks.  Obesity Tarasa is currently in the action stage of change. As such, her goal is to continue with weight loss efforts She has agreed to follow the Category 2 plan Keyana has been instructed to work up to a goal of 150 minutes of combined cardio and strengthening exercise per week for weight loss and overall health benefits. We discussed the following Behavioral Modification Strategies today: no skipping meals and planning for success  Betzayra has  agreed to follow up with our clinic in 2 to 3 weeks. She was informed of the importance of frequent follow up visits to maximize her success with intensive lifestyle modifications for her multiple health conditions.  ALLERGIES: Allergies  Allergen Reactions  . Penicillins Swelling and Other (See Comments)    Has patient had a PCN reaction causing immediate rash, facial/tongue/throat swelling, SOB or lightheadedness with hypotension: YES Has patient had a PCN  reaction causing severe rash involving mucus membranes or skin necrosis: NO Has patient had a PCN reaction that required hospitalization NO Has patient had a PCN reaction occurring within the last 10 years: NO If all of the above answers are "NO", then may proceed with Cephalosporin use.  [name] Hydrocodone-Acetaminophen Itching    confusion  . Latex Rash  . Sulfa Antibiotics Diarrhea, Itching and Rash    MEDICATIONS: Current Outpatient Medications on File Prior to Visit  Medication Sig Dispense Refill  . acetaminophen (TYLENOL) 325 MG tablet Take 1-2 tablets (325-650 mg total) by mouth every 6 (six) hours as needed for mild pain (pain score 1-3 or temp > 100.5). 60 tablet 0  . albuterol (PROVENTIL HFA;VENTOLIN HFA) 108 (90 Base) MCG/ACT inhaler Inhale 2 puffs into the lungs every 6 (six) hours as needed for wheezing or shortness of breath.    [name] amLODipine (NORVASC) 5 MG tablet Take 5 mg by mouth daily.  6  . atorvastatin (LIPITOR) 40 MG tablet Take 40 mg by mouth at bedtime.     . carvedilol (COREG CR) 10 MG 24 hr capsule Take 10 mg by mouth daily.    . furosemide (LASIX) 40 MG tablet Take 40 mg by mouth daily.    . isosorbide dinitrate (ISORDIL) 20 MG tablet Take 20 mg by mouth 2 (two) times daily.     [name] lidocaine (LIDODERM) 5 % Place 1 patch onto the skin daily. Remove & Discard patch within 12 hours or as directed by MD (Patient taking differently: Place 1 patch onto the skin daily as needed (for pain). Remove & Discard patch within 12 hours or as directed by MD) 30 patch 0  . lisinopril (PRINIVIL,ZESTRIL) 20 MG tablet Take 20 mg by mouth daily.     [name] loperamide (IMODIUM A-D) 2 MG tablet Take 4 mg by mouth 4 (four) times daily as needed for diarrhea or loose stools.     . meloxicam (MOBIC) 15 MG tablet 1 po daily prn 30 tablet 0  . MYRBETRIQ 25 MG TB24 tablet Take 1 tablet (25 mg total) by mouth daily. 30 tablet 1  . omeprazole (PRILOSEC) 20 MG capsule Take 20 mg by mouth 2 (two) times daily.     . potassium chloride (KLOR-CON M10) 10 MEQ tablet Take 1 tablet (10 mEq total) by mouth 2 (two) times daily. 60 tablet 1  . sertraline (ZOLOFT) 100 MG tablet Take 100 mg by mouth daily.     . SitaGLIPtin-MetFORMIN HCl (JANUMET XR) 50-500 MG TB24 Take 50-500 mg by mouth daily. 30 tablet 1  . oxyCODONE-acetaminophen (PERCOCET/ROXICET) 5-325 MG tablet Take 2 tablets by mouth every 6 (six) hours as needed for severe pain. (Patient not taking: Reported on 01/08/2019) 12 tablet 0  . [DISCONTINUED] KOMBIGLYZE XR 5-500 MG TB24 Take 1 tablet by mouth daily.      No current facility-administered medications on file prior to visit.     PAST MEDICAL HISTORY: Past Medical History:  Diagnosis Date  . Anginal pain ([place])    a. NL cath  in 2008;  b. Myoview 03/2011: dec uptake along mid anterior wall on stress imaging -> ? attenuation vs. ischemia, EF 65%;  c. Echo 04/2011: EF 55-60%, no RWMA, Gr 2 dd  . Anxiety   . Arthritis   . Asthma   . Back pain   . Bone cancer ([place])   . Cancer ([place])   . Chest pain   . CHF (congestive heart failure) ([place])   . CHF (congestive heart failure) ([place])   . Depression   . Diabetes mellitus   . Drug use   . Dyspnea    with exertion  . Dyspnea   . Frequent urination   . GERD (gastroesophageal reflux disease)   . Glaucoma   . HLD (hyperlipidemia)   . Hypertension   . IBS (irritable bowel syndrome)   . Joint pain   . Lactose intolerance   . Leg edema   . 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 Obesity   . Palpitations   . Pneumonia 05/2016   double  . Pulmonary edema   . Pulmonary embolism ([place])    a. 2008 -> coumadin x 6 mos.  . Rheumatoid arthritis ([place])   . Sleep apnea    on CPAP 02/2018  . TIA (transient ischemic attack)   . Urinary urgency     PAST SURGICAL HISTORY: Past Surgical History:  Procedure Laterality Date  . ABDOMINAL HYSTERECTOMY  2005  . APPENDECTOMY    . CARDIAC CATHETERIZATION     Normal  . CARDIAC  CATHETERIZATION N/A 09/30/2014   Procedure: Left Heart Cath and Coronary Angiography;  Surgeon: Sherren Mocha, MD;  Location: [facility];  Service: Cardiovascular;  Laterality: N/A;  . LAPAROSCOPIC APPENDECTOMY N/A 06/03/2012   Procedure: APPENDECTOMY LAPAROSCOPIC;  Surgeon: Stark Klein, MD;  Location: [facility];  Service: General;  Laterality: N/A;  . Left knee surgery  2008  . LEG SURGERY    . TONSILLECTOMY    . TOTAL HIP ARTHROPLASTY Left 06/11/2018   Procedure: LEFT TOTAL HIP ARTHROPLASTY ANTERIOR APPROACH;  Surgeon: Meredith Pel, MD;  Location: [facility];  Service: Orthopedics;  Laterality: Left;  . TOTAL KNEE ARTHROPLASTY Left 08/22/2016   Procedure: TOTAL KNEE ARTHROPLASTY;  Surgeon: Meredith Pel, MD;  Location: [facility];  Service: Orthopedics;  Laterality: Left;  . TUBAL LIGATION  1989    SOCIAL HISTORY: Social History   Tobacco Use  . Smoking status: Former Smoker    Packs/day: 0.50    Years: 15.00    Pack years: 7.50    Types: Cigarettes    Quit date: 04/24/1985    Years since quitting: 33.7  . Smokeless tobacco: Never Used  Substance Use Topics  . Alcohol use: No    Alcohol/week: 0.0 standard drinks  . Drug use: Not Currently    Types: Marijuana    FAMILY HISTORY: Family History  Problem Relation Age of Onset  . Emphysema Mother   . Arthritis Mother   . Heart failure Mother        alive @ 56  . Stroke Mother   . Diabetes Mother   . Hypertension Mother   . Hyperlipidemia Mother   . Depression Mother   . Anxiety disorder Mother   . Asthma Brother   . Heart disease Father        died @ 12's.  . Stroke Father   . Diabetes Father   . Hyperlipidemia Father   . Hypertension  Father   . Bipolar disorder Father   . Sleep apnea Father   . Alcoholism Father   . Drug abuse Father   . Diabetes Sister   . Heart disease Paternal Grandfather   . Colon cancer Maternal Grandfather   . Breast cancer Maternal Aunt     ROS: Review of Systems  Constitutional:  Positive for weight loss.  Respiratory: Negative for shortness of breath (on exertion).   Cardiovascular: Negative for chest pain.  Gastrointestinal: Negative for nausea and vomiting.  Musculoskeletal:       Negative for muscle weakness    PHYSICAL EXAM: Blood pressure (!) 146/81, pulse 75, temperature 98.2 F (36.8 C), temperature source Oral, height 4\' 10"  (1.473 m), weight 209 lb 3.2 oz (94.9 kg), SpO2 97 %. Body mass index is 43.72 kg/m. Physical Exam Vitals signs reviewed.  Constitutional:      Appearance: Normal appearance. She is well-developed. She is obese.  Cardiovascular:     Rate and Rhythm: Normal rate.  Pulmonary:     Effort: Pulmonary effort is normal.  Musculoskeletal: Normal range of motion.  Skin:    General: Skin is warm and dry.  Neurological:     Mental Status: She is alert and oriented to person, place, and time.  Psychiatric:        Mood and Affect: Mood normal.        Behavior: Behavior normal.     RECENT LABS AND TESTS: BMET    Component Value Date/Time   NA 146 (H) 11/18/2018 2043   NA 144 03/04/2018 1152   K 3.7 11/18/2018 2043   CL 112 (H) 11/18/2018 2043   CO2 26 11/18/2018 2043   GLUCOSE 140 (H) 11/18/2018 2043   BUN 11 11/18/2018 2043   BUN 17 03/04/2018 1152   CREATININE 0.92 11/18/2018 2043   CALCIUM 8.6 (L) 11/18/2018 2043   GFRNONAA >60 11/18/2018 2043   GFRAA >60 11/18/2018 2043   Lab Results  Component Value Date   HGBA1C 6.3 (H) 12/26/2018   HGBA1C 6.6 (H) 05/22/2018   HGBA1C 6.8 (H) 03/04/2018   HGBA1C 8.8 (H) 11/20/2017   HGBA1C 7.4 (H) 08/11/2016   Lab Results  Component Value Date   INSULIN 28.1 (H) 12/26/2018   INSULIN 20.2 03/04/2018   INSULIN 53.9 (H) 11/20/2017   CBC    Component Value Date/Time   WBC 5.9 11/18/2018 2043   RBC 4.43 11/18/2018 2043   HGB 11.7 (L) 11/18/2018 2043   HGB 12.8 11/20/2017 0925   HCT 37.2 11/18/2018 2043   HCT 40.6 11/20/2017 0925   PLT 212 11/18/2018 2043   MCV 84.0  11/18/2018 2043   MCV 85 11/20/2017 0925   MCH 26.4 11/18/2018 2043   MCHC 31.5 11/18/2018 2043   RDW 15.6 (H) 11/18/2018 2043   RDW 13.9 11/20/2017 0925   LYMPHSABS 2.7 06/12/2018 1321   LYMPHSABS 2.1 11/20/2017 0925   MONOABS 0.8 06/12/2018 1321   EOSABS 0.2 06/12/2018 1321   EOSABS 0.4 11/20/2017 0925   BASOSABS 0.0 06/12/2018 1321   BASOSABS 0.0 11/20/2017 0925   Iron/TIBC/Ferritin/ %Sat No results found for: IRON, TIBC, FERRITIN, IRONPCTSAT Lipid Panel     Component Value Date/Time   CHOL 119 12/26/2018 1135   TRIG 108 12/26/2018 1135   HDL 58 12/26/2018 1135   CHOLHDL 4.2 04/23/2011 0830   CHOLHDL 4.3 04/23/2011 0830   VLDL 29 04/23/2011 0830   VLDL 29 04/23/2011 0830   LDLCALC 89 03/04/2018 1152   Hepatic  Function Panel     Component Value Date/Time   PROT 6.4 03/04/2018 1152   ALBUMIN 4.1 03/04/2018 1152   AST 19 03/04/2018 1152   ALT 40 (H) 03/04/2018 1152   ALKPHOS 61 03/04/2018 1152   BILITOT 0.4 03/04/2018 1152   BILIDIR 0.1 05/22/2016 0913   IBILI 0.6 05/22/2016 0913      Component Value Date/Time   TSH 0.638 11/20/2017 0925   TSH 0.112 (L) 05/24/2016 0629   TSH 0.345 (L) 06/18/2014 0930   TSH 0.167 (L) 10/28/2012 0144     Ref. Range 12/26/2018 11:35  Vitamin D, 25-Hydroxy Latest Ref Range: 30.0 - 100.0 ng/mL 41.8    OBESITY BEHAVIORAL INTERVENTION VISIT  Today's visit was # 18   Starting weight: 225 lbs Starting date: 11/20/2017 Today's weight : 209 lbs Today's date: 01/08/2019 Total lbs lost to date: 16    01/08/2019  Height 4\' 10"  (1.473 m)  Weight 209 lb 3.2 oz (94.9 kg)  BMI (Calculated) 43.73  BLOOD PRESSURE - SYSTOLIC 123456  BLOOD PRESSURE - DIASTOLIC 81   Body Fat % 0000000 %  Total Body Water (lbs) 78.4 lbs    ASK: We discussed the diagnosis of obesity with Edwyna Ready today and Shiquita agreed to give [name] permission to discuss obesity behavioral modification therapy today.  ASSESS: Betzabet has the diagnosis of obesity and  her BMI today is 43.73 Shawnia is in the action stage of change   ADVISE: Edwin was educated on the multiple health risks of obesity as well as the benefit of weight loss to improve her health. She was advised of the need for long term treatment and the importance of lifestyle modifications to improve her current health and to decrease her risk of future health problems.  AGREE: Multiple dietary modification options and treatment options were discussed and  Chantille agreed to follow the recommendations documented in the above note.  ARRANGE: Shelisha was educated on the importance of frequent visits to treat obesity as outlined per CMS and USPSTF guidelines and agreed to schedule her next follow up appointment today.  I, Doreene Nest, am acting as transcriptionist for [employer], FNP-C  I have reviewed the above documentation for accuracy and completeness, and I agree with the above.  - Nidya Bouyer, FNP-C.

## 2019-01-15 ENCOUNTER — Encounter (INDEPENDENT_AMBULATORY_CARE_PROVIDER_SITE_OTHER): Payer: Self-pay | Admitting: Family Medicine

## 2019-01-16 ENCOUNTER — Other Ambulatory Visit: Payer: Self-pay

## 2019-01-16 ENCOUNTER — Ambulatory Visit
Admission: RE | Admit: 2019-01-16 | Discharge: 2019-01-16 | Disposition: A | Discharge status: Home / Self Care | Payer: Medicare Other | Source: Ambulatory Visit | Attending: Orthopedic Surgery | Admitting: Orthopedic Surgery

## 2019-01-16 DIAGNOSIS — M25552 Pain in left hip: Secondary | ICD-10-CM

## 2019-01-16 DIAGNOSIS — M5410 Radiculopathy, site unspecified: Secondary | ICD-10-CM

## 2019-01-19 ENCOUNTER — Other Ambulatory Visit (INDEPENDENT_AMBULATORY_CARE_PROVIDER_SITE_OTHER): Payer: Self-pay | Admitting: Bariatrics

## 2019-01-19 DIAGNOSIS — E559 Vitamin D deficiency, unspecified: Secondary | ICD-10-CM

## 2019-01-19 DIAGNOSIS — E119 Type 2 diabetes mellitus without complications: Secondary | ICD-10-CM

## 2019-01-20 ENCOUNTER — Ambulatory Visit: Payer: Medicare Other | Admitting: Orthopedic Surgery

## 2019-01-22 ENCOUNTER — Encounter: Payer: Self-pay | Admitting: Orthopedic Surgery

## 2019-01-22 ENCOUNTER — Ambulatory Visit (INDEPENDENT_AMBULATORY_CARE_PROVIDER_SITE_OTHER): Payer: Medicare Other | Admitting: Orthopedic Surgery

## 2019-01-22 DIAGNOSIS — M4802 Spinal stenosis, cervical region: Secondary | ICD-10-CM

## 2019-01-22 DIAGNOSIS — Z96642 Presence of left artificial hip joint: Secondary | ICD-10-CM | POA: Diagnosis not present

## 2019-01-22 DIAGNOSIS — M461 Sacroiliitis, not elsewhere classified: Secondary | ICD-10-CM

## 2019-01-22 DIAGNOSIS — M48061 Spinal stenosis, lumbar region without neurogenic claudication: Secondary | ICD-10-CM | POA: Diagnosis not present

## 2019-01-22 NOTE — Progress Notes (Signed)
Office Visit Note   Patient: [name]           Date of Birth: 05-24-1960           MRN: [id number] Visit Date: 01/22/2019 Requested by: No referring provider defined for this encounter. PCP: Tsosie Billing, MD (Inactive)  Subjective: Chief Complaint  Patient presents with  . Follow-up    HPI: [name] is a 58 y.o. female who presents to the office complaining of left groin and low back pain.  Patient is following up for MRI of lumbar spine and hip CT review.  Patient notes that her left groin pain has been improving.  She also notes that the burning pain lateral to her hip replacement incision has been improving.  She denies any radicular symptoms, numbness, tingling.  Denies any weakness.  She does note some pain in the low left back near the left SI joint that is worse with rotation.  Patient has a history of back pain for which she has received ESI's in the past that have helped.  She takes hydrocodone for pain occasionally.  Patient also notes cervical spine pain.  This is associated with right arm pain.  Denies any weakness.  She has a history of cervical spine pain with her last MRI on 05/26/2014 revealing multilevel degenerative disc disease without canal stenosis, multifactorial degenerative changes with moderate to severe foraminal stenosis on the left at C4-C5 and C5-C6, and on the right at C6-C7.              ROS:  All systems reviewed are negative as they relate to the chief complaint within the history of present illness.  Patient denies fevers or chills.  Assessment & Plan: Visit Diagnoses: No diagnosis found.  Plan: Patient is a 58 year old female who presents complaining of left groin pain and lumbar back pain.  She is here for advanced imaging review.  CT of the left hip reveals no significant abnormality of the left hip prosthesis but does show severe left sacroiliitis.  As for the lumbar spine MRI, it reveals severe bilateral facet  arthritis at L4-L5 and L5-S1 that is unchanged, chronic severe left foraminal stenosis at L4-L5 that is unchanged and chronic left lateral disc bulges at L2-L3 and L3-L4.  MRI L-spine does not show any significant change in the appearance of the lumbar spine since her prior MRI on 10/27/2017.  Based on history and exam, I feel she is having some pain that is stemming from her left SI joint as well as the facet joints of the lumbar spine.  Refer to Dr. Ernestina Patches for injections into the left lumbar spine facet joints, left SI joint, cervical spine in that order.  Patient has a history of diabetes.    Follow-Up Instructions: No follow-ups on file.   Orders:  No orders of the defined types were placed in this encounter.  No orders of the defined types were placed in this encounter.     Procedures: No procedures performed   Clinical Data: No additional findings.  Objective: Vital Signs: There were no vitals taken for this visit.  Physical Exam:  Constitutional: Patient appears well-developed HEENT:  Head: Normocephalic Eyes:EOM are normal Neck: Normal range of motion Cardiovascular: Normal rate Pulmonary/chest: Effort normal Neurologic: Patient is alert Skin: Skin is warm Psychiatric: Patient has normal mood and affect  Ortho Exam:  Mild pain with internal rotation of the left hip Mild pain with Stinchfield exam 5/5 motor strength of  the bilateral hip flexor, quad, dorsiflexion, plantarflexion TTP over the left SI joint No TTP over the right SI joint Incision from prior hip replacement has healed well Normal reflexes Sensation intact throughout all dermatomes of the bilateral lower extremities aside from some numbness just lateral to the hip replacement incision  Specialty Comments:  No specialty comments available.  Imaging: No results found.   PMFS History: Patient Active Problem List   Diagnosis Date Noted  . Unilateral primary osteoarthritis, left hip   . Hip arthritis  06/11/2018  . Class 3 severe obesity with serious comorbidity and body mass index (BMI) of 40.0 to 44.9 in adult ([place]) 05/29/2018  . Other fatigue 11/20/2017  . Shortness of breath on exertion 11/20/2017  . Type 2 diabetes mellitus without complication, without long-term current use of insulin ([place]) 11/20/2017  . Vitamin D deficiency 11/20/2017  . Depression 11/20/2017  . Other hyperlipidemia 11/20/2017  . 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 Presence of left artificial knee joint 09/20/2016  . Arthritis of knee 08/22/2016  . Cervical radiculopathy 07/26/2016  . Cervical disc disorder with radiculopathy 07/26/2016  . Right arm pain 06/29/2016  . Cervicalgia 06/29/2016  . Primary osteoarthritis of left knee 06/29/2016  . Hypersomnia with sleep apnea 11/18/2015  . Lethargy 11/18/2015  . Chest pain 09/30/2014  . Abnormal cardiac function test 09/29/2014  . Chest pain with moderate risk for cardiac etiology 09/28/2014  . HTN (hypertension) 09/28/2014  . Hypokalemia   . Paresthesia 06/18/2014  . Numbness and tingling of left arm and leg   . Unstable angina ([place]) 05/24/2014  . OSA on CPAP 05/24/2014  . Diabetes mellitus type 2 in obese ([place]) 10/29/2012  . S/P laparoscopic appendectomy 06/04/2012  . Diastolic CHF ([place]) [id number]  . Mediastinal mass 01/09/2012  . SOB (shortness of breath) 06/20/2011  . Mediastinal abnormality 06/20/2011  . Dyslipidemia 12/23/2009  . GLAUCOMA 12/23/2009  . ARTHRITIS 12/23/2009  . 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 Morbid obesity-BMI 45 09/13/2006  . ANXIETY STATE NOS 09/13/2006  . DISORDER, DEPRESSIVE NEC 09/13/2006  . CARPAL TUNNEL SYNDROME, MILD 09/13/2006  . Unspecified essential hypertension 09/13/2006  . IBS 09/13/2006  . DEGENERATION, LUMBAR/LUMBOSACRAL DISC 09/13/2006  . SYMPTOM, SWELLING/MASS/LUMP IN CHEST 09/13/2006  . PULMONARY EMBOLISM, HX OF 09/13/2006   Past Medical History:  Diagnosis Date  . Anginal pain ([place])    a. NL cath in  2008;  b. Myoview 03/2011: dec uptake along mid anterior wall on stress imaging -> ? attenuation vs. ischemia, EF 65%;  c. Echo 04/2011: EF 55-60%, no RWMA, Gr 2 dd  . Anxiety   . Arthritis   . Asthma   . Back pain   . Bone cancer ([place])   . Cancer ([place])   . Chest pain   . CHF (congestive heart failure) ([place])   . CHF (congestive heart failure) ([place])   . Depression   . Diabetes mellitus   . Drug use   . Dyspnea    with exertion  . Dyspnea   . Frequent urination   . GERD (gastroesophageal reflux disease)   . Glaucoma   . HLD (hyperlipidemia)   . Hypertension   . IBS (irritable bowel syndrome)   . Joint pain   . Lactose intolerance   . Leg edema   . 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 Obesity   . Palpitations   . Pneumonia 05/2016   double  . Pulmonary edema   .  Pulmonary embolism ([place])    a. 2008 -> coumadin x 6 mos.  . Rheumatoid arthritis ([place])   . Sleep apnea    on CPAP 02/2018  . TIA (transient ischemic attack)   . Urinary urgency     Family History  Problem Relation Age of Onset  . Emphysema Mother   . Arthritis Mother   . Heart failure Mother        alive @ 46  . Stroke Mother   . Diabetes Mother   . Hypertension Mother   . Hyperlipidemia Mother   . Depression Mother   . Anxiety disorder Mother   . Asthma Brother   . Heart disease Father        died @ 83's.  . Stroke Father   . Diabetes Father   . Hyperlipidemia Father   . Hypertension Father   . Bipolar disorder Father   . Sleep apnea Father   . Alcoholism Father   . Drug abuse Father   . Diabetes Sister   . Heart disease Paternal Grandfather   . Colon cancer Maternal Grandfather   . Breast cancer Maternal Aunt     Past Surgical History:  Procedure Laterality Date  . ABDOMINAL HYSTERECTOMY  2005  . APPENDECTOMY    . CARDIAC CATHETERIZATION     Normal  . CARDIAC CATHETERIZATION N/A 09/30/2014   Procedure: Left Heart Cath and Coronary Angiography;  Surgeon: Sherren Mocha,  MD;  Location: [facility];  Service: Cardiovascular;  Laterality: N/A;  . LAPAROSCOPIC APPENDECTOMY N/A 06/03/2012   Procedure: APPENDECTOMY LAPAROSCOPIC;  Surgeon: Stark Klein, MD;  Location: [facility];  Service: General;  Laterality: N/A;  . Left knee surgery  2008  . LEG SURGERY    . TONSILLECTOMY    . TOTAL HIP ARTHROPLASTY Left 06/11/2018   Procedure: LEFT TOTAL HIP ARTHROPLASTY ANTERIOR APPROACH;  Surgeon: Meredith Pel, MD;  Location: [facility];  Service: Orthopedics;  Laterality: Left;  . TOTAL KNEE ARTHROPLASTY Left 08/22/2016   Procedure: TOTAL KNEE ARTHROPLASTY;  Surgeon: Meredith Pel, MD;  Location: [facility];  Service: Orthopedics;  Laterality: Left;  . TUBAL LIGATION  1989   Social History   Occupational History  . Occupation: UNEMPLOYED    Employer: DISABLED  Tobacco Use  . Smoking status: Former Smoker    Packs/day: 0.50    Years: 15.00    Pack years: 7.50    Types: Cigarettes    Quit date: 04/24/1985    Years since quitting: 33.7  . Smokeless tobacco: Never Used  Substance and Sexual Activity  . Alcohol use: No    Alcohol/week: 0.0 standard drinks  . Drug use: Not Currently    Types: Marijuana  . Sexual activity: Never

## 2019-01-24 ENCOUNTER — Encounter: Payer: Self-pay | Admitting: Orthopedic Surgery

## 2019-01-28 ENCOUNTER — Ambulatory Visit: Payer: Medicare Other

## 2019-01-29 ENCOUNTER — Other Ambulatory Visit: Payer: Self-pay

## 2019-01-29 ENCOUNTER — Ambulatory Visit (INDEPENDENT_AMBULATORY_CARE_PROVIDER_SITE_OTHER): Payer: Medicare Other | Admitting: Family Medicine

## 2019-01-29 ENCOUNTER — Encounter (INDEPENDENT_AMBULATORY_CARE_PROVIDER_SITE_OTHER): Payer: Self-pay | Admitting: Family Medicine

## 2019-01-29 VITALS — BP 145/81 | HR 76 | Temp 98.4°F | Ht <= 58 in | Wt 213.0 lb

## 2019-01-29 DIAGNOSIS — I5032 Chronic diastolic (congestive) heart failure: Secondary | ICD-10-CM | POA: Diagnosis not present

## 2019-01-29 DIAGNOSIS — E119 Type 2 diabetes mellitus without complications: Secondary | ICD-10-CM

## 2019-01-29 DIAGNOSIS — Z6841 Body Mass Index (BMI) 40.0 and over, adult: Secondary | ICD-10-CM | POA: Diagnosis not present

## 2019-01-30 IMAGING — DX DG CHEST 2V
2 series · 2 of 2 positions shown · non-contrast
Comparison: CXR 09/30/2016, chest CT from 09/30/2016

CLINICAL DATA: 56-year-old female presenting with chest pain and
dyspnea this evening.

EXAM:
CHEST  2 VIEW

[chest pa]
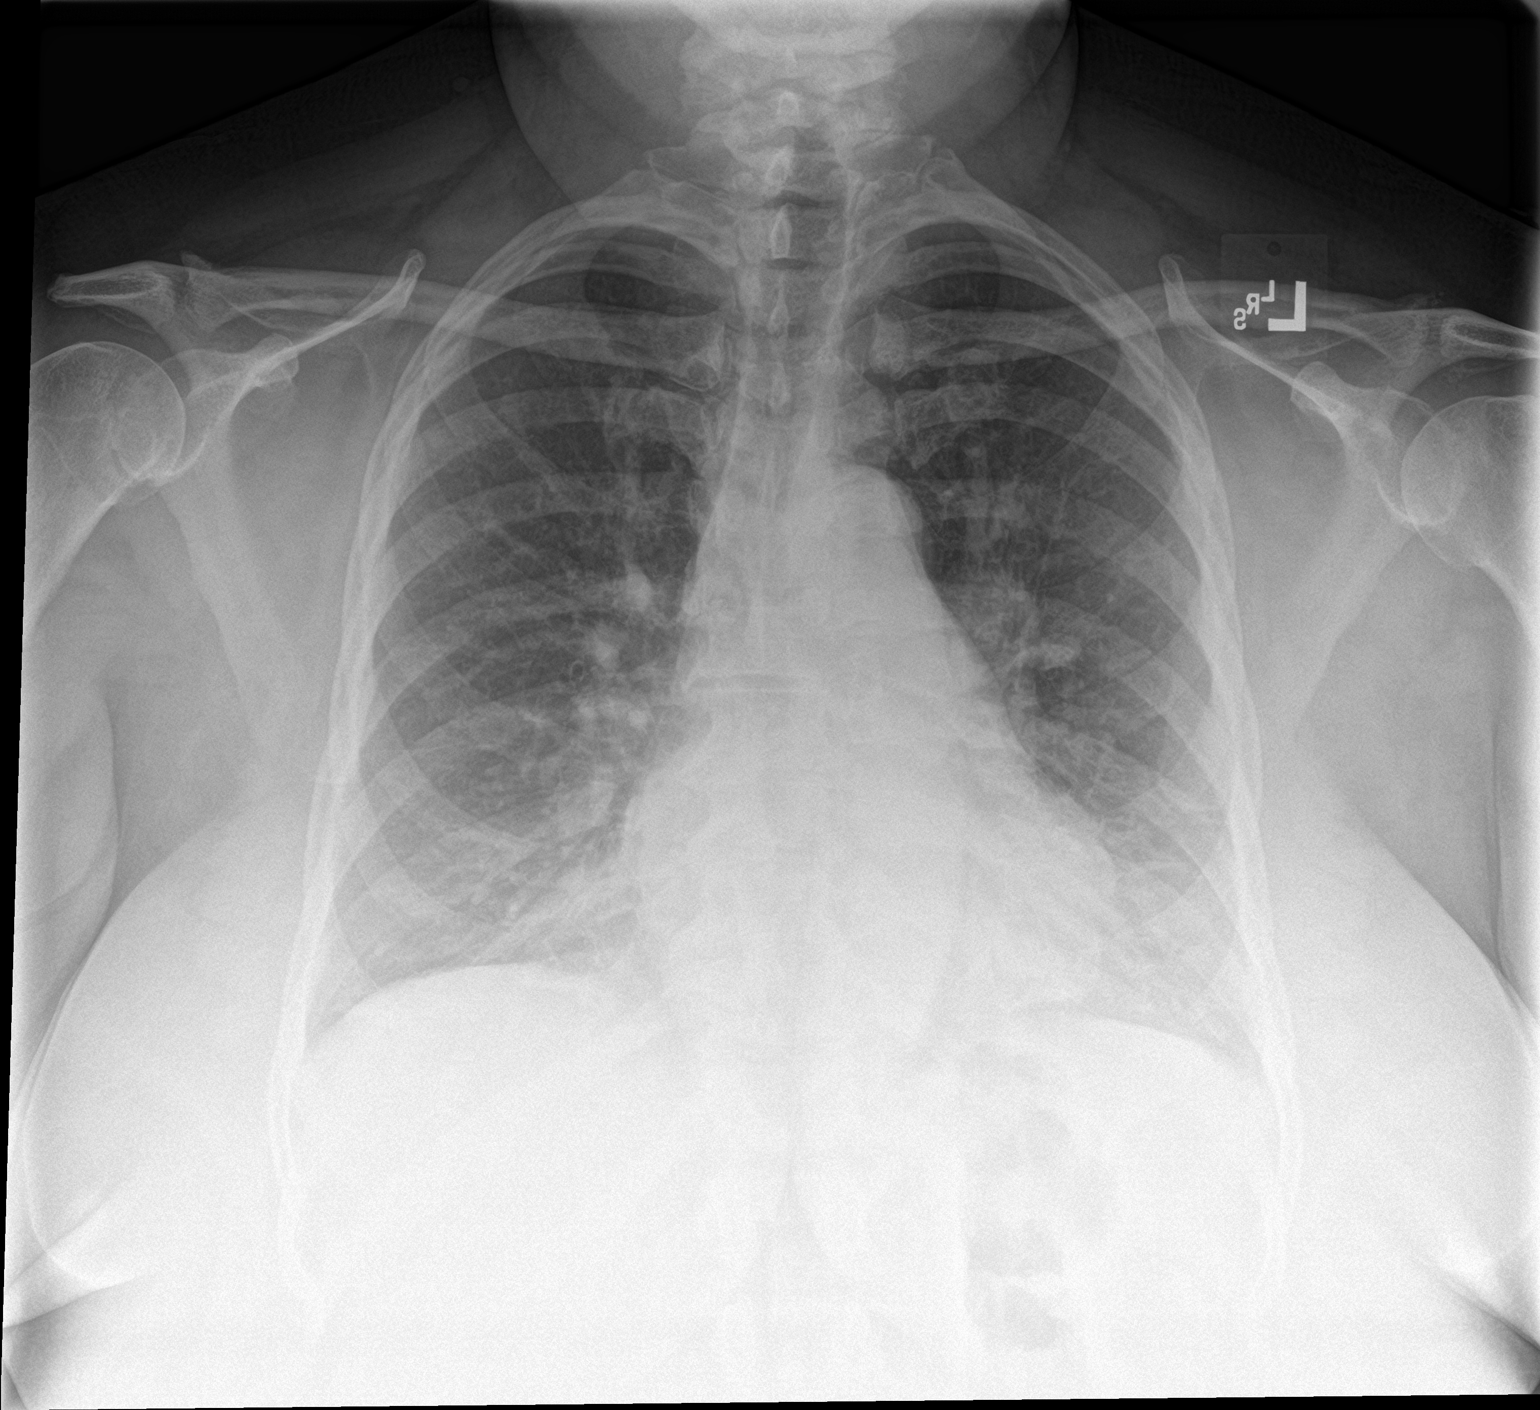

[chest lat]
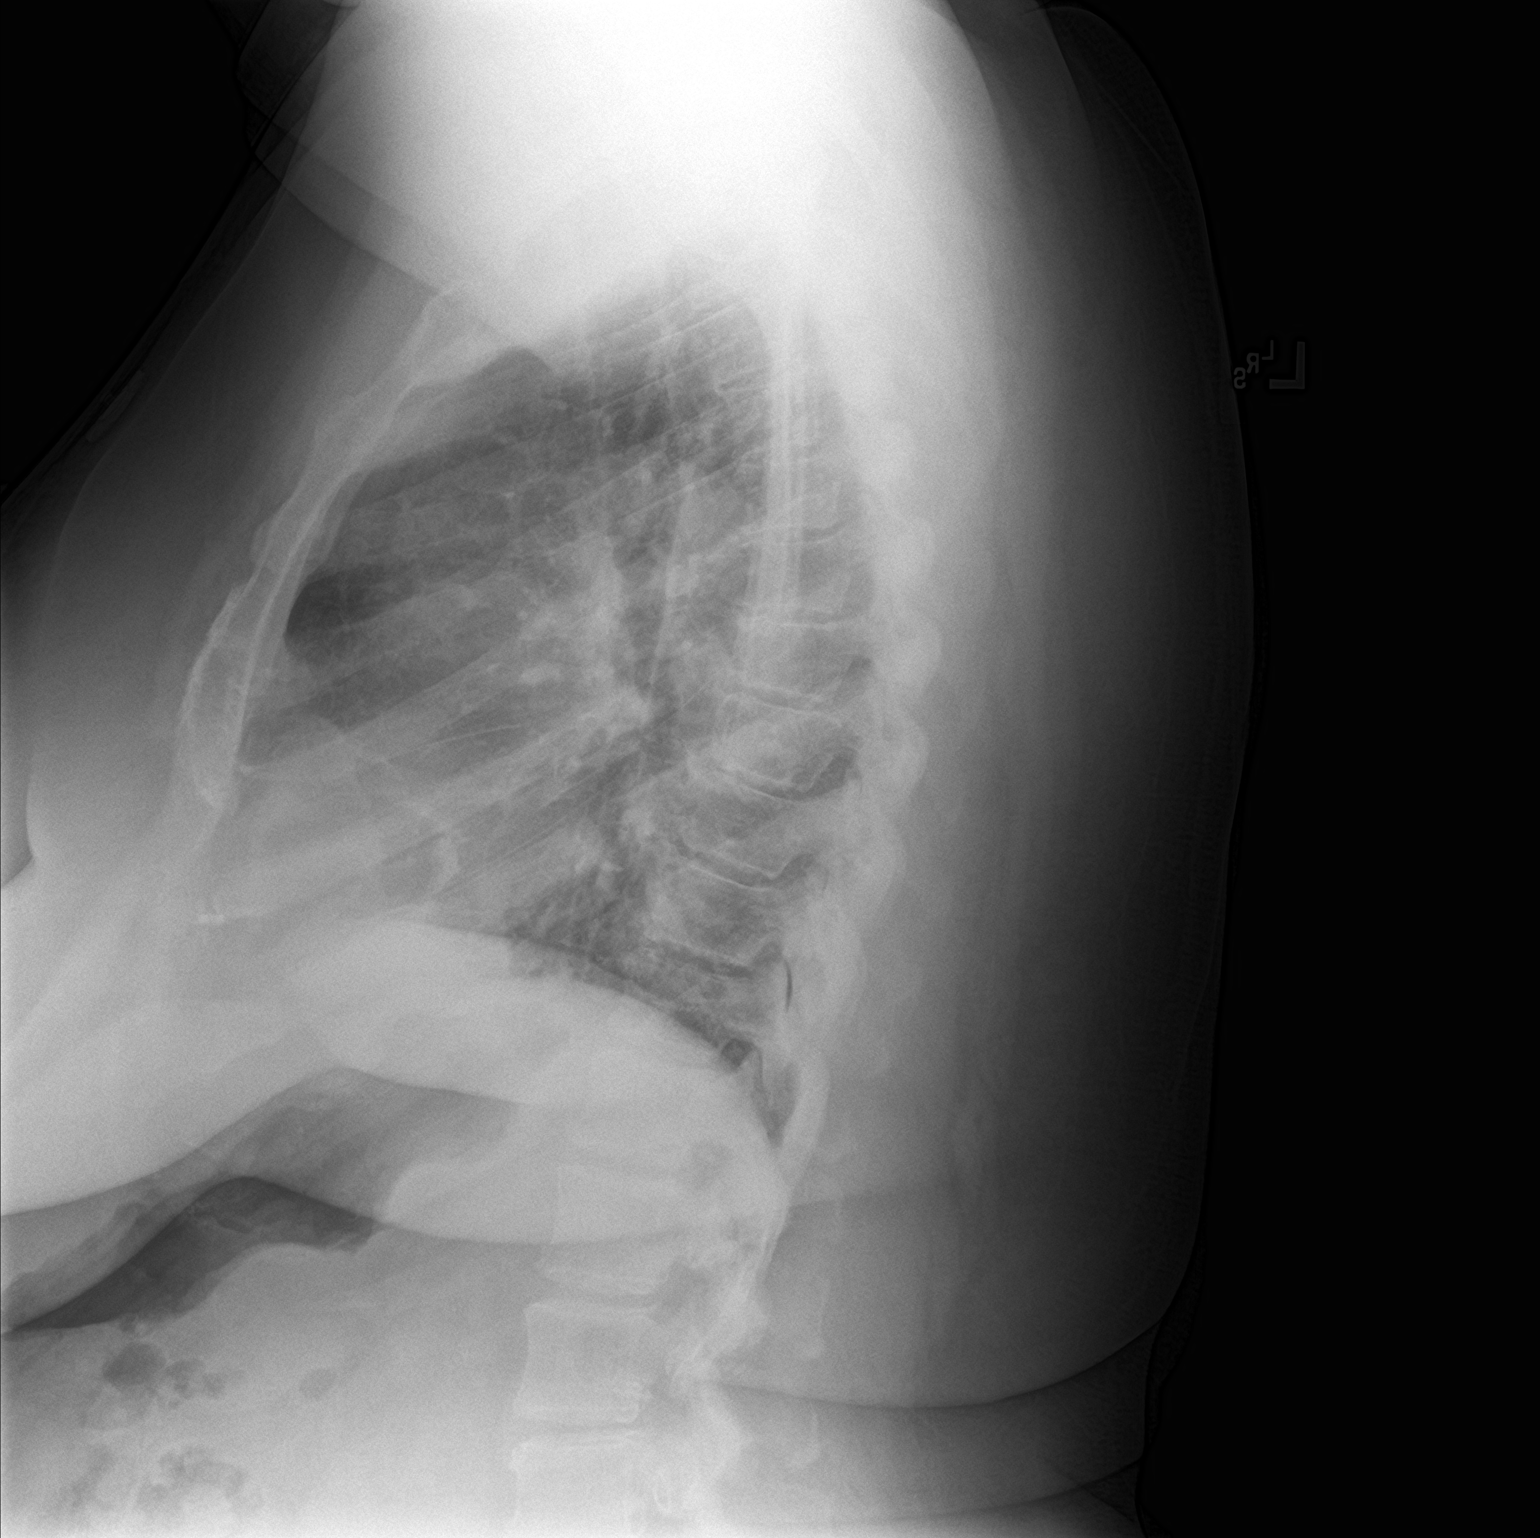

[2 of 2 positions shown; findings below may reference images not displayed]

FINDINGS: Heart is top-normal in size. No aortic aneurysm. Mild central
vascular congestion without pneumonic consolidation. No effusion or
pneumothorax. Mild degenerative change along the dorsal spine
without acute osseous appearing abnormality.
IMPRESSION: 1. No acute pneumonic consolidation or pneumothorax.
2. Mild central vascular congestion with top-normal size heart.

## 2019-01-30 NOTE — Progress Notes (Signed)
Office: [phone]  /  Fax: [phone]   HPI:   Chief Complaint: OBESITY [name] is here to discuss her progress with her obesity treatment plan. She is on the Category 2 plan and is following her eating plan approximately 70% of the time. She states she is walking 10-20 minutes 3 times per week. [name] reports her appetite is decreased at times and states she skips meals often.  Her weight is 213 lb (96.6 kg) today and has had a weight gain of 4 lbs since her last visit. She has lost 12 lbs since starting treatment with [name].  Congestive Heart Failure (chronic diastolic) [name] has gained 4 lbs since her last visit. She reports increased shortness of breath. She has not taken her Lasix today. She reports being compliant with CPAP and reports no orthopnea. She is not sleeping well. She states her hands are swollen. She does weigh daily on scales provided by [facility] for heart failure. Results from weights are transmitted to nurse at [facility]  Diabetes Mellitus [name] has a diagnosis of diabetes mellitus, which is well controlled. [name] states fasting blood sugars range between 90's and 130's. Last A1c was 6.3 on 12/26/2018. She has been working on intensive lifestyle modifications including diet, exercise, and weight loss to help control her blood glucose levels. She denies polyphagia or hypoglycemia.  ASSESSMENT AND PLAN:  Chronic diastolic congestive heart failure (HCC)  Type 2 diabetes mellitus without complication, without long-term current use of insulin (HCC)  Class 3 severe obesity with serious comorbidity and body mass index (BMI) of 40.0 to 44.9 in adult, unspecified obesity type ([place])  PLAN:  Congestive Heart Failure (chronic diastolic) [name] was advised to call Cardiology today. She will take Lasix when she gets home.  Diabetes Mellitus [name] has been given extensive diabetes education by myself today including ideal fasting and post-prandial blood glucose  readings, individual ideal HgA1c goals  and hypoglycemia prevention. We discussed the importance of good blood sugar control to decrease the likelihood of diabetic complications such as nephropathy, neuropathy, limb loss, blindness, coronary artery disease, and death. We discussed the importance of intensive lifestyle modification including diet, exercise and weight loss as the first line treatment for diabetes. [name] will continue Janumet and follow-up as directed.  Obesity [name] is currently in the action stage of change. As such, her goal is to continue with weight loss efforts. She has agreed to follow the Category 2 plan. She may do a shake rather than skipping a meal. [name] has been instructed to work up to a goal of 150 minutes of combined cardio and strengthening exercise per week for weight loss and overall health benefits. We discussed the following Behavioral Modification Strategies today: increasing lean protein intake, no skipping meals, and planning for success.  [name] has agreed to follow-up with our clinic in 2 weeks. She was informed of the importance of frequent follow-up visits to maximize her success with intensive lifestyle modifications for her multiple health conditions.  ALLERGIES: Allergies  Allergen Reactions  . Penicillins Swelling and Other (See Comments)    Has patient had a PCN reaction causing immediate rash, facial/tongue/throat swelling, SOB or lightheadedness with hypotension: YES Has patient had a PCN reaction causing severe rash involving mucus membranes or skin necrosis: NO Has patient had a PCN reaction that required hospitalization NO Has patient had a PCN reaction occurring within the last 10 years: NO If all of the above answers are "NO", then may proceed with Cephalosporin use.  [name]  Hydrocodone-Acetaminophen Itching    confusion  . Latex Rash  . Sulfa Antibiotics Diarrhea, Itching and Rash    MEDICATIONS: Current Outpatient Medications on File  Prior to Visit  Medication Sig Dispense Refill  . acetaminophen (TYLENOL) 325 MG tablet Take 1-2 tablets (325-650 mg total) by mouth every 6 (six) hours as needed for mild pain (pain score 1-3 or temp > 100.5). 60 tablet 0  . albuterol (PROVENTIL HFA;VENTOLIN HFA) 108 (90 Base) MCG/ACT inhaler Inhale 2 puffs into the lungs every 6 (six) hours as needed for wheezing or shortness of breath.    [name] amLODipine (NORVASC) 5 MG tablet Take 5 mg by mouth daily.  6  . atorvastatin (LIPITOR) 40 MG tablet Take 40 mg by mouth at bedtime.     . carvedilol (COREG CR) 10 MG 24 hr capsule Take 10 mg by mouth daily.    . furosemide (LASIX) 40 MG tablet Take 40 mg by mouth daily.    . isosorbide dinitrate (ISORDIL) 20 MG tablet Take 20 mg by mouth 2 (two) times daily.     [name] JANUMET XR 50-500 MG TB24 TAKE 1 TABLET BY MOUTH EVERY DAY 30 tablet 1  . lidocaine (LIDODERM) 5 % Place 1 patch onto the skin daily. Remove & Discard patch within 12 hours or as directed by MD (Patient taking differently: Place 1 patch onto the skin daily as needed (for pain). Remove & Discard patch within 12 hours or as directed by MD) 30 patch 0  . lisinopril (PRINIVIL,ZESTRIL) 20 MG tablet Take 20 mg by mouth daily.     [name] loperamide (IMODIUM A-D) 2 MG tablet Take 4 mg by mouth 4 (four) times daily as needed for diarrhea or loose stools.     . meloxicam (MOBIC) 15 MG tablet 1 po daily prn 30 tablet 0  . MYRBETRIQ 25 MG TB24 tablet Take 1 tablet (25 mg total) by mouth daily. 30 tablet 1  . omeprazole (PRILOSEC) 20 MG capsule Take 20 mg by mouth 2 (two) times daily.    [name] oxyCODONE-acetaminophen (PERCOCET/ROXICET) 5-325 MG tablet Take 2 tablets by mouth every 6 (six) hours as needed for severe pain. (Patient not taking: Reported on 01/08/2019) 12 tablet 0  . potassium chloride (KLOR-CON M10) 10 MEQ tablet Take 1 tablet (10 mEq total) by mouth 2 (two) times daily. 60 tablet 1  . sertraline (ZOLOFT) 100 MG tablet Take 100 mg by mouth daily.     .  Vitamin D, Ergocalciferol, (DRISDOL) 1.25 MG (50000 UT) CAPS capsule Take 1 capsule (50,000 Units total) by mouth every 7 (seven) days. 4 capsule 0  . [DISCONTINUED] KOMBIGLYZE XR 5-500 MG TB24 Take 1 tablet by mouth daily.      No current facility-administered medications on file prior to visit.     PAST MEDICAL HISTORY: Past Medical History:  Diagnosis Date  . Anginal pain ([place])    a. NL cath in 2008;  b. Myoview 03/2011: dec uptake along mid anterior wall on stress imaging -> ? attenuation vs. ischemia, EF 65%;  c. Echo 04/2011: EF 55-60%, no RWMA, Gr 2 dd  . Anxiety   . Arthritis   . Asthma   . Back pain   . Bone cancer ([place])   . Cancer ([place])   . Chest pain   . CHF (congestive heart failure) ([place])   . CHF (congestive heart failure) ([place])   . Depression   . Diabetes mellitus   . Drug use   . Dyspnea  with exertion  . Dyspnea   . Frequent urination   . GERD (gastroesophageal reflux disease)   . Glaucoma   . HLD (hyperlipidemia)   . Hypertension   . IBS (irritable bowel syndrome)   . Joint pain   . Lactose intolerance   . Leg edema   . 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 Obesity   . Palpitations   . Pneumonia 05/2016   double  . Pulmonary edema   . Pulmonary embolism ([place])    a. 2008 -> coumadin x 6 mos.  . Rheumatoid arthritis ([place])   . Sleep apnea    on CPAP 02/2018  . TIA (transient ischemic attack)   . Urinary urgency     PAST SURGICAL HISTORY: Past Surgical History:  Procedure Laterality Date  . ABDOMINAL HYSTERECTOMY  2005  . APPENDECTOMY    . CARDIAC CATHETERIZATION     Normal  . CARDIAC CATHETERIZATION N/A 09/30/2014   Procedure: Left Heart Cath and Coronary Angiography;  Surgeon: Sherren Mocha, MD;  Location: [facility];  Service: Cardiovascular;  Laterality: N/A;  . LAPAROSCOPIC APPENDECTOMY N/A 06/03/2012   Procedure: APPENDECTOMY LAPAROSCOPIC;  Surgeon: Stark Klein, MD;  Location: [facility];  Service: General;   Laterality: N/A;  . Left knee surgery  2008  . LEG SURGERY    . TONSILLECTOMY    . TOTAL HIP ARTHROPLASTY Left 06/11/2018   Procedure: LEFT TOTAL HIP ARTHROPLASTY ANTERIOR APPROACH;  Surgeon: Meredith Pel, MD;  Location: [facility];  Service: Orthopedics;  Laterality: Left;  . TOTAL KNEE ARTHROPLASTY Left 08/22/2016   Procedure: TOTAL KNEE ARTHROPLASTY;  Surgeon: Meredith Pel, MD;  Location: [facility];  Service: Orthopedics;  Laterality: Left;  . TUBAL LIGATION  1989    SOCIAL HISTORY: Social History   Tobacco Use  . Smoking status: Former Smoker    Packs/day: 0.50    Years: 15.00    Pack years: 7.50    Types: Cigarettes    Quit date: 04/24/1985    Years since quitting: 33.7  . Smokeless tobacco: Never Used  Substance Use Topics  . Alcohol use: No    Alcohol/week: 0.0 standard drinks  . Drug use: Not Currently    Types: Marijuana    FAMILY HISTORY: Family History  Problem Relation Age of Onset  . Emphysema Mother   . Arthritis Mother   . Heart failure Mother        alive @ 85  . Stroke Mother   . Diabetes Mother   . Hypertension Mother   . Hyperlipidemia Mother   . Depression Mother   . Anxiety disorder Mother   . Asthma Brother   . Heart disease Father        died @ 11's.  . Stroke Father   . Diabetes Father   . Hyperlipidemia Father   . Hypertension Father   . Bipolar disorder Father   . Sleep apnea Father   . Alcoholism Father   . Drug abuse Father   . Diabetes Sister   . Heart disease Paternal Grandfather   . Colon cancer Maternal Grandfather   . Breast cancer Maternal Aunt    ROS: Review of Systems  Respiratory: Positive for shortness of breath (increased).   Cardiovascular: Negative for orthopnea.  Endo/Heme/Allergies:       Negative for hypoglycemia. Negative for polyphagia.   PHYSICAL EXAM: Blood pressure (!) 145/81, pulse 76, temperature 98.4 F (36.9 C), temperature source  Oral, height 4\' 10"  (1.473 m), weight 213 lb (96.6 kg), SpO2 98 %.  Body mass index is 44.52 kg/m. Physical Exam Vitals signs reviewed.  Constitutional:      Appearance: Normal appearance. She is obese.  Cardiovascular:     Rate and Rhythm: Normal rate.     Pulses: Normal pulses.     Comments: Trace peripheral edema. Pulmonary:     Effort: Pulmonary effort is normal.     Breath sounds: Normal breath sounds.     Comments: Lungs clear to auscultation. Musculoskeletal: Normal range of motion.  Skin:    General: Skin is warm and dry.  Neurological:     Mental Status: She is alert and oriented to person, place, and time.  Psychiatric:        Behavior: Behavior normal.   RECENT LABS AND TESTS: BMET    Component Value Date/Time   NA 146 (H) 11/18/2018 2043   NA 144 03/04/2018 1152   K 3.7 11/18/2018 2043   CL 112 (H) 11/18/2018 2043   CO2 26 11/18/2018 2043   GLUCOSE 140 (H) 11/18/2018 2043   BUN 11 11/18/2018 2043   BUN 17 03/04/2018 1152   CREATININE 0.92 11/18/2018 2043   CALCIUM 8.6 (L) 11/18/2018 2043   GFRNONAA >60 11/18/2018 2043   GFRAA >60 11/18/2018 2043   Lab Results  Component Value Date   HGBA1C 6.3 (H) 12/26/2018   HGBA1C 6.6 (H) 05/22/2018   HGBA1C 6.8 (H) 03/04/2018   HGBA1C 8.8 (H) 11/20/2017   HGBA1C 7.4 (H) 08/11/2016   Lab Results  Component Value Date   INSULIN 28.1 (H) 12/26/2018   INSULIN 20.2 03/04/2018   INSULIN 53.9 (H) 11/20/2017   CBC    Component Value Date/Time   WBC 5.9 11/18/2018 2043   RBC 4.43 11/18/2018 2043   HGB 11.7 (L) 11/18/2018 2043   HGB 12.8 11/20/2017 0925   HCT 37.2 11/18/2018 2043   HCT 40.6 11/20/2017 0925   PLT 212 11/18/2018 2043   MCV 84.0 11/18/2018 2043   MCV 85 11/20/2017 0925   MCH 26.4 11/18/2018 2043   MCHC 31.5 11/18/2018 2043   RDW 15.6 (H) 11/18/2018 2043   RDW 13.9 11/20/2017 0925   LYMPHSABS 2.7 06/12/2018 1321   LYMPHSABS 2.1 11/20/2017 0925   MONOABS 0.8 06/12/2018 1321   EOSABS 0.2 06/12/2018 1321   EOSABS 0.4 11/20/2017 0925   BASOSABS 0.0 06/12/2018  1321   BASOSABS 0.0 11/20/2017 0925   Iron/TIBC/Ferritin/ %Sat No results found for: IRON, TIBC, FERRITIN, IRONPCTSAT Lipid Panel     Component Value Date/Time   CHOL 119 12/26/2018 1135   TRIG 108 12/26/2018 1135   HDL 58 12/26/2018 1135   CHOLHDL 4.2 04/23/2011 0830   CHOLHDL 4.3 04/23/2011 0830   VLDL 29 04/23/2011 0830   VLDL 29 04/23/2011 0830   LDLCALC 41 12/26/2018 1135   Hepatic Function Panel     Component Value Date/Time   PROT 6.4 03/04/2018 1152   ALBUMIN 4.1 03/04/2018 1152   AST 19 03/04/2018 1152   ALT 40 (H) 03/04/2018 1152   ALKPHOS 61 03/04/2018 1152   BILITOT 0.4 03/04/2018 1152   BILIDIR 0.1 05/22/2016 0913   IBILI 0.6 05/22/2016 0913      Component Value Date/Time   TSH 0.638 11/20/2017 0925   TSH 0.112 (L) 05/24/2016 0629   TSH 0.345 (L) 06/18/2014 0930   TSH 0.167 (L) 10/28/2012 0144   Results for ALEXEA, GARRAHAN (MRN [id number]) as of 01/30/2019  08:28  Ref. Range 12/26/2018 11:35  Vitamin D, 25-Hydroxy Latest Ref Range: 30.0 - 100.0 ng/mL 41.8   OBESITY BEHAVIORAL INTERVENTION VISIT  Today's visit was #19   Starting weight: 225 lbs Starting date: 11/20/2017 Today's weight: 213 lbs Today's date: 01/29/2019 Total lbs lost to date: 12 At least 15 minutes were spent on discussing the following behavioral intervention visit.    01/29/2019  Height 4\' 10"  (1.473 m)  Weight 213 lb (96.6 kg)  BMI (Calculated) 44.53  BLOOD PRESSURE - SYSTOLIC [id number]  BLOOD PRESSURE - DIASTOLIC 81   Body Fat % [id number] %   ASK: We discussed the diagnosis of obesity with [name] today and [name] agreed to give [name] permission to discuss obesity behavioral modification therapy today.  ASSESS: [name] has the diagnosis of obesity and her BMI today is 44.7. [name] is in the action stage of change.   ADVISE: [name] was educated on the multiple health risks of obesity as well as the benefit of weight loss to improve her health. She was advised of the need for  long term treatment and the importance of lifestyle modifications to improve her current health and to decrease her risk of future health problems.  AGREE: Multiple dietary modification options and treatment options were discussed and  [name] agreed to follow the recommendations documented in the above note.  ARRANGE: [name] was educated on the importance of frequent visits to treat obesity as outlined per CMS and USPSTF guidelines and agreed to schedule her next follow up appointment today.  IMichaelene Barrera, am acting as Location manager for [employer], FNP  I have reviewed the above documentation for accuracy and completeness, and I agree with the above.  - Samreen Seltzer, FNP-C.

## 2019-02-03 ENCOUNTER — Encounter (INDEPENDENT_AMBULATORY_CARE_PROVIDER_SITE_OTHER): Payer: Self-pay | Admitting: Family Medicine

## 2019-02-06 ENCOUNTER — Other Ambulatory Visit (INDEPENDENT_AMBULATORY_CARE_PROVIDER_SITE_OTHER): Payer: Self-pay | Admitting: Bariatrics

## 2019-02-07 ENCOUNTER — Telehealth: Payer: Self-pay | Admitting: Orthopedic Surgery

## 2019-02-07 DIAGNOSIS — M48061 Spinal stenosis, lumbar region without neurogenic claudication: Secondary | ICD-10-CM

## 2019-02-07 DIAGNOSIS — M4802 Spinal stenosis, cervical region: Secondary | ICD-10-CM

## 2019-02-07 DIAGNOSIS — M461 Sacroiliitis, not elsewhere classified: Secondary | ICD-10-CM

## 2019-02-07 NOTE — Telephone Encounter (Signed)
Entering orders per Dr. Marlou Sa last office note. It states to do left lumbar facet injections, left SI joint injections, and cervical ESI in that order.

## 2019-02-17 ENCOUNTER — Other Ambulatory Visit (INDEPENDENT_AMBULATORY_CARE_PROVIDER_SITE_OTHER): Payer: Self-pay | Admitting: Bariatrics

## 2019-02-17 DIAGNOSIS — E559 Vitamin D deficiency, unspecified: Secondary | ICD-10-CM

## 2019-02-19 ENCOUNTER — Ambulatory Visit (INDEPENDENT_AMBULATORY_CARE_PROVIDER_SITE_OTHER): Payer: Medicare Other | Admitting: Family Medicine

## 2019-02-19 ENCOUNTER — Encounter (INDEPENDENT_AMBULATORY_CARE_PROVIDER_SITE_OTHER): Payer: Self-pay | Admitting: Family Medicine

## 2019-02-19 ENCOUNTER — Other Ambulatory Visit: Payer: Self-pay

## 2019-02-19 ENCOUNTER — Other Ambulatory Visit: Payer: Self-pay | Admitting: Surgical

## 2019-02-19 VITALS — BP 130/77 | HR 75 | Temp 98.6°F | Ht <= 58 in | Wt 210.0 lb

## 2019-02-19 DIAGNOSIS — Z6841 Body Mass Index (BMI) 40.0 and over, adult: Secondary | ICD-10-CM

## 2019-02-19 DIAGNOSIS — E559 Vitamin D deficiency, unspecified: Secondary | ICD-10-CM | POA: Diagnosis not present

## 2019-02-19 DIAGNOSIS — E119 Type 2 diabetes mellitus without complications: Secondary | ICD-10-CM

## 2019-02-19 MED ORDER — VITAMIN D (ERGOCALCIFEROL) 1.25 MG (50000 UNIT) PO CAPS: 50000.0000 [IU] | ORAL_CAPSULE | ORAL | 0 refills | Status: DC

## 2019-02-19 NOTE — Telephone Encounter (Signed)
Ok to rf? 

## 2019-02-19 NOTE — Progress Notes (Signed)
Office: [phone]  /  Fax: [phone]   HPI:   Chief Complaint: OBESITY [name] is here to discuss her progress with her obesity treatment plan. She is on the Category 2 plan and is following her eating plan approximately 70 % of the time. She states she is walking 10 minutes 4 times per week. [name] is still skipping meals. She has added a protein shake a few times as directed when skipping meals. She mostly skips breakfast or lunch.  Her weight is 210 lb (95.3 kg) today and has had a weight loss of 3 pounds over a period of 3 weeks since her last visit. She has lost 15 lbs since starting treatment with [name].  Vitamin D Deficiency Axel has a diagnosis of vitamin D deficiency. She is currently on vit D, but is nearly at goal. Her last vitamin D level was 41.8 on 12/26/18. Kelleen denies nausea, vomiting, or muscle weakness.  Diabetes II Deztini has a diagnosis of diabetes type II. Debbe's blood sugars are well controlled on Janumet. Her last A1c was 6.3 on 12/26/18. Her fasting blood sugars range from 102 to 140 and she does not check 2 hour post prandial sugars. She admits to rare hypoglycemic episodes when she skips a meal. She has been working on intensive lifestyle modifications including diet, exercise, and weight loss to help control her blood glucose levels.  ASSESSMENT AND PLAN:  Vitamin D deficiency - Plan: Vitamin D, Ergocalciferol, (DRISDOL) 1.25 MG (50000 UT) CAPS capsule  Type 2 diabetes mellitus without complication, without long-term current use of insulin (HCC)  Class 3 severe obesity with serious comorbidity and body mass index (BMI) of 40.0 to 44.9 in adult, unspecified obesity type ([place])  PLAN:  Vitamin D Deficiency [name] was informed that low vitamin D levels contribute to fatigue and are associated with obesity, breast, and colon cancer. [name] agrees to continue to take prescription Vit D @50 ,000 IU every week #4 with no refills and will follow up for routine  testing of vitamin D, at least 2-3 times per year. She was informed of the risk of over-replacement of vitamin D and agrees to not increase her dose unless she discusses this with [name] first. [name] agrees to follow up in 3 weeks as directed.  Diabetes II [name] has been given extensive diabetes education by myself today including ideal fasting and post-prandial blood glucose readings, individual ideal Hgb A1c goals, and hypoglycemia prevention. We discussed the importance of good blood sugar control to decrease the likelihood of diabetic complications such as nephropathy, neuropathy, limb loss, blindness, coronary artery disease, and death. We discussed the importance of intensive lifestyle modification including diet, exercise, and weight loss as the first line treatment for diabetes. [name] agrees to continue her Janumet and will follow up at the agreed upon time.   Obesity [name] is currently in the action stage of change. As such, her goal is to continue with weight loss efforts. She has agreed to follow the Category 2 plan. [name] has been instructed to continue walking 4 times per week. We discussed the following Behavioral Modification Strategies today: increasing lean protein intake, no skipping meals, planning for success, and work on meal planning and easy cooking plans. Jun will have a protein shake if skipping a meal. She was provided the egg muffin recipe.  [name] has agreed to follow up with our clinic in 3 weeks. She was informed of the importance of frequent follow up visits to maximize her success with intensive lifestyle  modifications for her multiple health conditions.  ALLERGIES: Allergies  Allergen Reactions  . Penicillins Swelling and Other (See Comments)    Has patient had a PCN reaction causing immediate rash, facial/tongue/throat swelling, SOB or lightheadedness with hypotension: YES Has patient had a PCN reaction causing severe rash involving mucus membranes or skin  necrosis: NO Has patient had a PCN reaction that required hospitalization NO Has patient had a PCN reaction occurring within the last 10 years: NO If all of the above answers are "NO", then may proceed with Cephalosporin use.  [name] Hydrocodone-Acetaminophen Itching    confusion  . Latex Rash  . Sulfa Antibiotics Diarrhea, Itching and Rash    MEDICATIONS: Current Outpatient Medications on File Prior to Visit  Medication Sig Dispense Refill  . acetaminophen (TYLENOL) 325 MG tablet Take 1-2 tablets (325-650 mg total) by mouth every 6 (six) hours as needed for mild pain (pain score 1-3 or temp > 100.5). 60 tablet 0  . albuterol (PROVENTIL HFA;VENTOLIN HFA) 108 (90 Base) MCG/ACT inhaler Inhale 2 puffs into the lungs every 6 (six) hours as needed for wheezing or shortness of breath.    [name] amLODipine (NORVASC) 5 MG tablet Take 5 mg by mouth daily.  6  . atorvastatin (LIPITOR) 40 MG tablet Take 40 mg by mouth at bedtime.     . carvedilol (COREG CR) 10 MG 24 hr capsule Take 10 mg by mouth daily.    . furosemide (LASIX) 40 MG tablet Take 40 mg by mouth daily.    . isosorbide dinitrate (ISORDIL) 20 MG tablet Take 20 mg by mouth 2 (two) times daily.     [name] JANUMET XR 50-500 MG TB24 TAKE 1 TABLET BY MOUTH EVERY DAY 30 tablet 1  . lidocaine (LIDODERM) 5 % Place 1 patch onto the skin daily. Remove & Discard patch within 12 hours or as directed by MD (Patient taking differently: Place 1 patch onto the skin daily as needed (for pain). Remove & Discard patch within 12 hours or as directed by MD) 30 patch 0  . lisinopril (PRINIVIL,ZESTRIL) 20 MG tablet Take 20 mg by mouth daily.     [name] loperamide (IMODIUM A-D) 2 MG tablet Take 4 mg by mouth 4 (four) times daily as needed for diarrhea or loose stools.     [name] MYRBETRIQ 25 MG TB24 tablet TAKE 1 TABLET BY MOUTH EVERY DAY 14 tablet 0  . omeprazole (PRILOSEC) 20 MG capsule Take 20 mg by mouth 2 (two) times daily.    [name] oxyCODONE-acetaminophen (PERCOCET/ROXICET) 5-325 MG  tablet Take 2 tablets by mouth every 6 (six) hours as needed for severe pain. 12 tablet 0  . potassium chloride (KLOR-CON M10) 10 MEQ tablet Take 1 tablet (10 mEq total) by mouth 2 (two) times daily. 60 tablet 1  . sertraline (ZOLOFT) 100 MG tablet Take 100 mg by mouth daily.     . [DISCONTINUED] KOMBIGLYZE XR 5-500 MG TB24 Take 1 tablet by mouth daily.      No current facility-administered medications on file prior to visit.     PAST MEDICAL HISTORY: Past Medical History:  Diagnosis Date  . Anginal pain ([place])    a. NL cath in 2008;  b. Myoview 03/2011: dec uptake along mid anterior wall on stress imaging -> ? attenuation vs. ischemia, EF 65%;  c. Echo 04/2011: EF 55-60%, no RWMA, Gr 2 dd  . Anxiety   . Arthritis   . Asthma   . Back pain   . Bone  cancer ([place])   . Cancer ([place])   . Chest pain   . CHF (congestive heart failure) ([place])   . CHF (congestive heart failure) ([place])   . Depression   . Diabetes mellitus   . Drug use   . Dyspnea    with exertion  . Dyspnea   . Frequent urination   . GERD (gastroesophageal reflux disease)   . Glaucoma   . HLD (hyperlipidemia)   . Hypertension   . IBS (irritable bowel syndrome)   . Joint pain   . Lactose intolerance   . Leg edema   . 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 Obesity   . Palpitations   . Pneumonia 05/2016   double  . Pulmonary edema   . Pulmonary embolism ([place])    a. 2008 -> coumadin x 6 mos.  . Rheumatoid arthritis ([place])   . Sleep apnea    on CPAP 02/2018  . TIA (transient ischemic attack)   . Urinary urgency     PAST SURGICAL HISTORY: Past Surgical History:  Procedure Laterality Date  . ABDOMINAL HYSTERECTOMY  2005  . APPENDECTOMY    . CARDIAC CATHETERIZATION     Normal  . CARDIAC CATHETERIZATION N/A 09/30/2014   Procedure: Left Heart Cath and Coronary Angiography;  Surgeon: Sherren Mocha, MD;  Location: [facility];  Service: Cardiovascular;  Laterality: N/A;  . LAPAROSCOPIC  APPENDECTOMY N/A 06/03/2012   Procedure: APPENDECTOMY LAPAROSCOPIC;  Surgeon: Stark Klein, MD;  Location: [facility];  Service: General;  Laterality: N/A;  . Left knee surgery  2008  . LEG SURGERY    . TONSILLECTOMY    . TOTAL HIP ARTHROPLASTY Left 06/11/2018   Procedure: LEFT TOTAL HIP ARTHROPLASTY ANTERIOR APPROACH;  Surgeon: Meredith Pel, MD;  Location: [facility];  Service: Orthopedics;  Laterality: Left;  . TOTAL KNEE ARTHROPLASTY Left 08/22/2016   Procedure: TOTAL KNEE ARTHROPLASTY;  Surgeon: Meredith Pel, MD;  Location: [facility];  Service: Orthopedics;  Laterality: Left;  . TUBAL LIGATION  1989    SOCIAL HISTORY: Social History   Tobacco Use  . Smoking status: Former Smoker    Packs/day: 0.50    Years: 15.00    Pack years: 7.50    Types: Cigarettes    Quit date: 04/24/1985    Years since quitting: 33.8  . Smokeless tobacco: Never Used  Substance Use Topics  . Alcohol use: No    Alcohol/week: 0.0 standard drinks  . Drug use: Not Currently    Types: Marijuana    FAMILY HISTORY: Family History  Problem Relation Age of Onset  . Emphysema Mother   . Arthritis Mother   . Heart failure Mother        alive @ 48  . Stroke Mother   . Diabetes Mother   . Hypertension Mother   . Hyperlipidemia Mother   . Depression Mother   . Anxiety disorder Mother   . Asthma Brother   . Heart disease Father        died @ 22's.  . Stroke Father   . Diabetes Father   . Hyperlipidemia Father   . Hypertension Father   . Bipolar disorder Father   . Sleep apnea Father   . Alcoholism Father   . Drug abuse Father   . Diabetes Sister   . Heart disease Paternal Grandfather   . Colon cancer Maternal Grandfather   . Breast cancer Maternal Aunt  ROS: Review of Systems  Gastrointestinal: Negative for nausea and vomiting.  Musculoskeletal:       Negative for muscle weakness.  Endo/Heme/Allergies:       Positive for hypoglycemia.    PHYSICAL EXAM: Blood pressure 130/77, pulse 75,  temperature 98.6 F (37 C), temperature source Oral, height 4\' 10"  (1.473 m), weight 210 lb (95.3 kg), SpO2 96 %. Body mass index is 43.89 kg/m. Physical Exam Vitals signs reviewed.  Constitutional:      Appearance: Normal appearance. She is obese.  Cardiovascular:     Rate and Rhythm: Normal rate.  Pulmonary:     Effort: Pulmonary effort is normal.  Musculoskeletal: Normal range of motion.  Skin:    General: Skin is warm and dry.  Neurological:     Mental Status: She is alert and oriented to person, place, and time.  Psychiatric:        Mood and Affect: Mood normal.        Behavior: Behavior normal.     RECENT LABS AND TESTS: BMET    Component Value Date/Time   NA 146 (H) 11/18/2018 2043   NA 144 03/04/2018 1152   K 3.7 11/18/2018 2043   CL 112 (H) 11/18/2018 2043   CO2 26 11/18/2018 2043   GLUCOSE 140 (H) 11/18/2018 2043   BUN 11 11/18/2018 2043   BUN 17 03/04/2018 1152   CREATININE 0.92 11/18/2018 2043   CALCIUM 8.6 (L) 11/18/2018 2043   GFRNONAA >60 11/18/2018 2043   GFRAA >60 11/18/2018 2043   Lab Results  Component Value Date   HGBA1C 6.3 (H) 12/26/2018   HGBA1C 6.6 (H) 05/22/2018   HGBA1C 6.8 (H) 03/04/2018   HGBA1C 8.8 (H) 11/20/2017   HGBA1C 7.4 (H) 08/11/2016   Lab Results  Component Value Date   INSULIN 28.1 (H) 12/26/2018   INSULIN 20.2 03/04/2018   INSULIN 53.9 (H) 11/20/2017   CBC    Component Value Date/Time   WBC 5.9 11/18/2018 2043   RBC 4.43 11/18/2018 2043   HGB 11.7 (L) 11/18/2018 2043   HGB 12.8 11/20/2017 0925   HCT 37.2 11/18/2018 2043   HCT 40.6 11/20/2017 0925   PLT 212 11/18/2018 2043   MCV 84.0 11/18/2018 2043   MCV 85 11/20/2017 0925   MCH 26.4 11/18/2018 2043   MCHC 31.5 11/18/2018 2043   RDW 15.6 (H) 11/18/2018 2043   RDW 13.9 11/20/2017 0925   LYMPHSABS 2.7 06/12/2018 1321   LYMPHSABS 2.1 11/20/2017 0925   MONOABS 0.8 06/12/2018 1321   EOSABS 0.2 06/12/2018 1321   EOSABS 0.4 11/20/2017 0925   BASOSABS 0.0  06/12/2018 1321   BASOSABS 0.0 11/20/2017 0925   Iron/TIBC/Ferritin/ %Sat No results found for: IRON, TIBC, FERRITIN, IRONPCTSAT Lipid Panel     Component Value Date/Time   CHOL 119 12/26/2018 1135   TRIG 108 12/26/2018 1135   HDL 58 12/26/2018 1135   CHOLHDL 4.2 04/23/2011 0830   CHOLHDL 4.3 04/23/2011 0830   VLDL 29 04/23/2011 0830   VLDL 29 04/23/2011 0830   LDLCALC 41 12/26/2018 1135   Hepatic Function Panel     Component Value Date/Time   PROT 6.4 03/04/2018 1152   ALBUMIN 4.1 03/04/2018 1152   AST 19 03/04/2018 1152   ALT 40 (H) 03/04/2018 1152   ALKPHOS 61 03/04/2018 1152   BILITOT 0.4 03/04/2018 1152   BILIDIR 0.1 05/22/2016 0913   IBILI 0.6 05/22/2016 0913      Component Value Date/Time   TSH 0.638 11/20/2017  0925   TSH 0.112 (L) 05/24/2016 0629   TSH 0.345 (L) 06/18/2014 0930   TSH 0.167 (L) 10/28/2012 0144   Results for CARMIA, SKOK (MRN [id number]) as of 02/19/2019 13:45  Ref. Range 12/26/2018 11:35  Vitamin D, 25-Hydroxy Latest Ref Range: 30.0 - 100.0 ng/mL 41.8   OBESITY BEHAVIORAL INTERVENTION VISIT  Today's visit was # 20  Starting weight: 225 lbs Starting date: 11/20/17 Today's weight : Weight: 210 lb (95.3 kg)  Today's date: 02/19/2019 Total lbs lost to date: 15 At least 15 minutes were spent on discussing the following behavioral intervention visit.    02/19/2019  Height 4\' 10"  (1.473 m)  Weight 210 lb (95.3 kg)  BMI (Calculated) 43.9  BLOOD PRESSURE - SYSTOLIC [id number]  BLOOD PRESSURE - DIASTOLIC 77   Body Fat % [id number] %    ASK: We discussed the diagnosis of obesity with Edwyna Ready today and Kahleesi agreed to give [name] permission to discuss obesity behavioral modification therapy today.  ASSESS: Jovana has the diagnosis of obesity and her BMI today is 43.9. Amir is in the action stage of change.   ADVISE: Lorelee was educated on the multiple health risks of obesity as well as the benefit of weight loss to improve her health.  She was advised of the need for long term treatment and the importance of lifestyle modifications to improve her current health and to decrease her risk of future health problems.  AGREE: Multiple dietary modification options and treatment options were discussed and Sierah agreed to follow the recommendations documented in the above note.  ARRANGE: Dhyani was educated on the importance of frequent visits to treat obesity as outlined per CMS and USPSTF guidelines and agreed to schedule her next follow up appointment today.  Lenward Chancellor, CMA, am acting as Location manager for [employer], FNP-C.  I have reviewed the above documentation for accuracy and completeness, and I agree with the above.  -  , FNP-C.

## 2019-02-19 NOTE — Telephone Encounter (Signed)
Lauren please advise, thanks.

## 2019-02-20 ENCOUNTER — Encounter (INDEPENDENT_AMBULATORY_CARE_PROVIDER_SITE_OTHER): Payer: Self-pay | Admitting: Family Medicine

## 2019-02-24 ENCOUNTER — Telehealth: Payer: Self-pay | Admitting: Orthopedic Surgery

## 2019-02-24 NOTE — Telephone Encounter (Signed)
Patient called advised she is have a dental appointment 02/26/2019 and would like to know if she need an antibiotic? Patient has hip replacemment in 06/11/2018. The number to contact is [phone]

## 2019-02-25 ENCOUNTER — Telehealth: Payer: Self-pay | Admitting: Orthopedic Surgery

## 2019-02-25 ENCOUNTER — Telehealth: Payer: Self-pay | Admitting: Surgical

## 2019-02-25 ENCOUNTER — Other Ambulatory Visit: Payer: Self-pay | Admitting: Surgical

## 2019-02-25 MED ORDER — CLINDAMYCIN HCL 300 MG PO CAPS: ORAL_CAPSULE | ORAL | 0 refills | Status: DC

## 2019-02-25 NOTE — Telephone Encounter (Signed)
Please advise. Thanks.  

## 2019-02-25 NOTE — Telephone Encounter (Signed)
Patient called concerning her dental appointment tomorrow  And the antibiotic she is needing at 11:00am.  Patient said to leave a message if she can not answer the phone. The number to contact patient is [phone]

## 2019-02-25 NOTE — Telephone Encounter (Signed)
IC LM for her advising had sent note to [facility] earlier today but they have been in surgery all day and as soon as I get a response I would let her know.

## 2019-02-28 NOTE — Progress Notes (Deleted)
Cardiology Office Note   Date:  02/28/2019   ID:  Tara, Barrera 01-29-1961, MRN [id number]  PCP:  Tsosie Billing, MD (Inactive)  Cardiologist:   [name], MD   No chief complaint on file.     History of Present Illness: [name] is a 58 y.o. female who presents for f/u of atypical chest pain, diastolic CHF, HTN, HLD , DM and morbid obesity. Had nonspecific elevation in troponin when seen in ER for leg pain. Distant history of PE Rx coumadin CT June 2018 no PE off anticoagulation. Atypical chest pains in past with multiple normal caths most recently 2016 Last TTE done 05/22/16 EF 0000000 grade 2 diastolic no valve disease unable to estimate PA pressures   Still seeing [facility] for obesity management   She had her left TKR with Dr Marlou Sa 3 years ago   Left THR 05/2018 Dr Marlou Sa   ***  Past Medical History:  Diagnosis Date  . Anginal pain ([place])    a. NL cath in 2008;  b. Myoview 03/2011: dec uptake along mid anterior wall on stress imaging -> ? attenuation vs. ischemia, EF 65%;  c. Echo 04/2011: EF 55-60%, no RWMA, Gr 2 dd  . Anxiety   . Arthritis   . Asthma   . Back pain   . Bone cancer ([place])   . Cancer ([place])   . Chest pain   . CHF (congestive heart failure) ([place])   . CHF (congestive heart failure) ([place])   . Depression   . Diabetes mellitus   . Drug use   . Dyspnea    with exertion  . Dyspnea   . Frequent urination   . GERD (gastroesophageal reflux disease)   . Glaucoma   . HLD (hyperlipidemia)   . Hypertension   . IBS (irritable bowel syndrome)   . Joint pain   . Lactose intolerance   . Leg edema   . 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 Obesity   . Palpitations   . Pneumonia 05/2016   double  . Pulmonary edema   . Pulmonary embolism ([place])    a. 2008 -> coumadin x 6 mos.  . Rheumatoid arthritis ([place])   . Sleep apnea    on CPAP 02/2018  . TIA (transient ischemic attack)   . Urinary  urgency     Past Surgical History:  Procedure Laterality Date  . ABDOMINAL HYSTERECTOMY  2005  . APPENDECTOMY    . CARDIAC CATHETERIZATION     Normal  . CARDIAC CATHETERIZATION N/A 09/30/2014   Procedure: Left Heart Cath and Coronary Angiography;  Surgeon: Sherren Mocha, MD;  Location: [facility];  Service: Cardiovascular;  Laterality: N/A;  . LAPAROSCOPIC APPENDECTOMY N/A 06/03/2012   Procedure: APPENDECTOMY LAPAROSCOPIC;  Surgeon: Stark Klein, MD;  Location: [facility];  Service: General;  Laterality: N/A;  . Left knee surgery  2008  . LEG SURGERY    . TONSILLECTOMY    . TOTAL HIP ARTHROPLASTY Left 06/11/2018   Procedure: LEFT TOTAL HIP ARTHROPLASTY ANTERIOR APPROACH;  Surgeon: Meredith Pel, MD;  Location: [facility];  Service: Orthopedics;  Laterality: Left;  . TOTAL KNEE ARTHROPLASTY Left 08/22/2016   Procedure: TOTAL KNEE ARTHROPLASTY;  Surgeon: Meredith Pel, MD;  Location: [facility];  Service: Orthopedics;  Laterality: Left;  . TUBAL LIGATION  1989     Current Outpatient Medications  Medication Sig Dispense  Refill  . acetaminophen (TYLENOL) 325 MG tablet Take 1-2 tablets (325-650 mg total) by mouth every 6 (six) hours as needed for mild pain (pain score 1-3 or temp > 100.5). 60 tablet 0  . albuterol (PROVENTIL HFA;VENTOLIN HFA) 108 (90 Base) MCG/ACT inhaler Inhale 2 puffs into the lungs every 6 (six) hours as needed for wheezing or shortness of breath.    [name] amLODipine (NORVASC) 5 MG tablet Take 5 mg by mouth daily.  6  . atorvastatin (LIPITOR) 40 MG tablet Take 40 mg by mouth at bedtime.     . carvedilol (COREG CR) 10 MG 24 hr capsule Take 10 mg by mouth daily.    . clindamycin (CLEOCIN) 300 MG capsule Take 2 capsules 30-60 minutes prior to dental procedure. 4 capsule 0  . furosemide (LASIX) 40 MG tablet Take 40 mg by mouth daily.    . isosorbide dinitrate (ISORDIL) 20 MG tablet Take 20 mg by mouth 2 (two) times daily.     [name] JANUMET XR 50-500 MG TB24 TAKE 1 TABLET BY MOUTH  EVERY DAY 30 tablet 1  . lidocaine (LIDODERM) 5 % Place 1 patch onto the skin daily. Remove & Discard patch within 12 hours or as directed by MD (Patient taking differently: Place 1 patch onto the skin daily as needed (for pain). Remove & Discard patch within 12 hours or as directed by MD) 30 patch 0  . lisinopril (PRINIVIL,ZESTRIL) 20 MG tablet Take 20 mg by mouth daily.     [name] loperamide (IMODIUM A-D) 2 MG tablet Take 4 mg by mouth 4 (four) times daily as needed for diarrhea or loose stools.     . meloxicam (MOBIC) 15 MG tablet TAKE 1 TABLET BY MOUTH EVERY DAY AS NEEDED 30 tablet 0  . MYRBETRIQ 25 MG TB24 tablet TAKE 1 TABLET BY MOUTH EVERY DAY 14 tablet 0  . omeprazole (PRILOSEC) 20 MG capsule Take 20 mg by mouth 2 (two) times daily.    [name] oxyCODONE-acetaminophen (PERCOCET/ROXICET) 5-325 MG tablet Take 2 tablets by mouth every 6 (six) hours as needed for severe pain. 12 tablet 0  . potassium chloride (KLOR-CON M10) 10 MEQ tablet Take 1 tablet (10 mEq total) by mouth 2 (two) times daily. 60 tablet 1  . sertraline (ZOLOFT) 100 MG tablet Take 100 mg by mouth daily.     . Vitamin D, Ergocalciferol, (DRISDOL) 1.25 MG (50000 UT) CAPS capsule Take 1 capsule (50,000 Units total) by mouth every 7 (seven) days. 4 capsule 0   No current facility-administered medications for this visit.     Allergies:   Penicillins, Hydrocodone-acetaminophen, Latex, and Sulfa antibiotics    Social History:  The patient  reports that she quit smoking about 33 years ago. Her smoking use included cigarettes. She has a 7.50 pack-year smoking history. She has never used smokeless tobacco. She reports previous drug use. Drug: Marijuana. She reports that she does not drink alcohol.   Family History:  The patient's family history includes Alcoholism in her father; Anxiety disorder in her mother; Arthritis in her mother; Asthma in her brother; Bipolar disorder in her father; Breast cancer in her maternal aunt; Colon cancer in her  maternal grandfather; Depression in her mother; Diabetes in her father, mother, and sister; Drug abuse in her father; Emphysema in her mother; Heart disease in her father and paternal grandfather; Heart failure in her mother; Hyperlipidemia in her father and mother; Hypertension in her father and mother; Sleep apnea in her father; Stroke in  her father and mother.    ROS:  Please see the history of present illness.   Otherwise, review of systems are positive for none.   All other systems are reviewed and negative.    PHYSICAL EXAM: VS:  There were no vitals taken for this visit. , BMI There is no height or weight on file to calculate BMI. Affect appropriate Obese black female  HEENT: normal Neck supple with no adenopathy JVP normal no bruits no thyromegaly Lungs clear with no wheezing and good diaphragmatic motion Heart:  S1/S2 no murmur, no rub, gallop or click PMI normal Abdomen: benighn, BS positve, no tenderness, no AAA no bruit.  No HSM or HJR Distal pulses intact with no bruits No edema Neuro non-focal Skin warm and dry LEFT TKR  LEFT THR     EKG:  04/28/18 SR ICLBBB LVH chronic inferior lateral T wave inversions    Recent Labs: 03/04/2018: ALT 40 11/18/2018: B Natriuretic Peptide 26.4; BUN 11; Creatinine, Ser 0.92; Hemoglobin 11.7; Platelets 212; Potassium 3.7; Sodium 146    Lipid Panel    Component Value Date/Time   CHOL 119 12/26/2018 1135   TRIG 108 12/26/2018 1135   HDL 58 12/26/2018 1135   CHOLHDL 4.2 04/23/2011 0830   CHOLHDL 4.3 04/23/2011 0830   VLDL 29 04/23/2011 0830   VLDL 29 04/23/2011 0830   LDLCALC 41 12/26/2018 1135      Wt Readings from Last 3 Encounters:  02/19/19 210 lb (95.3 kg)  01/29/19 213 lb (96.6 kg)  01/08/19 209 lb 3.2 oz (94.9 kg)      Other studies Reviewed: Additional studies/ records that were reviewed today include: Notes from primary , ortho weight loss center Cardiology notes cath x 2 TTE.    ASSESSMENT AND PLAN:  1.   Diastolic Dysfunction related to obesity, HTN and DM continue lasix stable 2.  HTN:  Well controlled.  Continue current medications and low sodium Dash type diet.   3. HLD:  Continue statin labs with primary  4. Ortho  Post left total hip replacement 05/2018  5. DM:  Discussed low carb diet.  Target hemoglobin A1c is 6.5 or less.  Continue current medications.    Current medicines are reviewed at length with the patient today.  The patient does not have concerns regarding medicines.  The following changes have been made:  no change  Labs/ tests ordered today include: None  No orders of the defined types were placed in this encounter.    Disposition:   FU with cardiology PRN      Signed, [name], MD  02/28/2019 4:29 PM    [address], [place], [place]  [address] Phone: [phone]; Fax: [phone]

## 2019-03-03 ENCOUNTER — Encounter: Payer: Medicare Other | Admitting: Physical Medicine and Rehabilitation

## 2019-03-04 ENCOUNTER — Encounter (HOSPITAL_COMMUNITY): Payer: Self-pay | Admitting: Pharmacy Technician

## 2019-03-04 ENCOUNTER — Emergency Department (HOSPITAL_COMMUNITY): Payer: Medicare Other

## 2019-03-04 ENCOUNTER — Observation Stay (HOSPITAL_COMMUNITY)
Admission: EM | Admit: 2019-03-04 | Discharge: 2019-03-06 | Disposition: A | Discharge status: Home / Self Care | Payer: Medicare Other | Attending: Internal Medicine | Admitting: Internal Medicine

## 2019-03-04 ENCOUNTER — Other Ambulatory Visit: Payer: Self-pay

## 2019-03-04 DIAGNOSIS — M069 Rheumatoid arthritis, unspecified: Secondary | ICD-10-CM | POA: Diagnosis not present

## 2019-03-04 DIAGNOSIS — I5033 Acute on chronic diastolic (congestive) heart failure: Secondary | ICD-10-CM | POA: Diagnosis present

## 2019-03-04 DIAGNOSIS — I5032 Chronic diastolic (congestive) heart failure: Secondary | ICD-10-CM | POA: Insufficient documentation

## 2019-03-04 DIAGNOSIS — G4730 Sleep apnea, unspecified: Secondary | ICD-10-CM | POA: Diagnosis not present

## 2019-03-04 DIAGNOSIS — Z881 Allergy status to other antibiotic agents status: Secondary | ICD-10-CM | POA: Insufficient documentation

## 2019-03-04 DIAGNOSIS — Z20828 Contact with and (suspected) exposure to other viral communicable diseases: Secondary | ICD-10-CM | POA: Diagnosis not present

## 2019-03-04 DIAGNOSIS — R079 Chest pain, unspecified: Secondary | ICD-10-CM | POA: Diagnosis present

## 2019-03-04 DIAGNOSIS — Z836 Family history of other diseases of the respiratory system: Secondary | ICD-10-CM | POA: Insufficient documentation

## 2019-03-04 DIAGNOSIS — R778 Other specified abnormalities of plasma proteins: Secondary | ICD-10-CM | POA: Diagnosis present

## 2019-03-04 DIAGNOSIS — Z96642 Presence of left artificial hip joint: Secondary | ICD-10-CM | POA: Diagnosis not present

## 2019-03-04 DIAGNOSIS — Z8673 Personal history of transient ischemic attack (TIA), and cerebral infarction without residual deficits: Secondary | ICD-10-CM | POA: Diagnosis not present

## 2019-03-04 DIAGNOSIS — Z86711 Personal history of pulmonary embolism: Secondary | ICD-10-CM | POA: Insufficient documentation

## 2019-03-04 DIAGNOSIS — E119 Type 2 diabetes mellitus without complications: Secondary | ICD-10-CM | POA: Diagnosis not present

## 2019-03-04 DIAGNOSIS — Z79899 Other long term (current) drug therapy: Secondary | ICD-10-CM | POA: Insufficient documentation

## 2019-03-04 DIAGNOSIS — F419 Anxiety disorder, unspecified: Secondary | ICD-10-CM | POA: Insufficient documentation

## 2019-03-04 DIAGNOSIS — E1169 Type 2 diabetes mellitus with other specified complication: Secondary | ICD-10-CM | POA: Diagnosis present

## 2019-03-04 DIAGNOSIS — I313 Pericardial effusion (noninflammatory): Secondary | ICD-10-CM | POA: Insufficient documentation

## 2019-03-04 DIAGNOSIS — E876 Hypokalemia: Secondary | ICD-10-CM | POA: Diagnosis not present

## 2019-03-04 DIAGNOSIS — Z96652 Presence of left artificial knee joint: Secondary | ICD-10-CM | POA: Insufficient documentation

## 2019-03-04 DIAGNOSIS — I110 Hypertensive heart disease with heart failure: Secondary | ICD-10-CM | POA: Insufficient documentation

## 2019-03-04 DIAGNOSIS — Z885 Allergy status to narcotic agent status: Secondary | ICD-10-CM | POA: Insufficient documentation

## 2019-03-04 DIAGNOSIS — I358 Other nonrheumatic aortic valve disorders: Secondary | ICD-10-CM | POA: Insufficient documentation

## 2019-03-04 DIAGNOSIS — Z823 Family history of stroke: Secondary | ICD-10-CM | POA: Insufficient documentation

## 2019-03-04 DIAGNOSIS — Z833 Family history of diabetes mellitus: Secondary | ICD-10-CM | POA: Insufficient documentation

## 2019-03-04 DIAGNOSIS — Z91040 Latex allergy status: Secondary | ICD-10-CM | POA: Insufficient documentation

## 2019-03-04 DIAGNOSIS — Z882 Allergy status to sulfonamides status: Secondary | ICD-10-CM | POA: Insufficient documentation

## 2019-03-04 DIAGNOSIS — E785 Hyperlipidemia, unspecified: Secondary | ICD-10-CM | POA: Insufficient documentation

## 2019-03-04 DIAGNOSIS — Z6841 Body Mass Index (BMI) 40.0 and over, adult: Secondary | ICD-10-CM | POA: Insufficient documentation

## 2019-03-04 DIAGNOSIS — Z87891 Personal history of nicotine dependence: Secondary | ICD-10-CM | POA: Diagnosis not present

## 2019-03-04 DIAGNOSIS — Z791 Long term (current) use of non-steroidal anti-inflammatories (NSAID): Secondary | ICD-10-CM | POA: Insufficient documentation

## 2019-03-04 DIAGNOSIS — J45909 Unspecified asthma, uncomplicated: Secondary | ICD-10-CM | POA: Insufficient documentation

## 2019-03-04 DIAGNOSIS — Z880 Allergy status to penicillin: Secondary | ICD-10-CM | POA: Insufficient documentation

## 2019-03-04 DIAGNOSIS — F329 Major depressive disorder, single episode, unspecified: Secondary | ICD-10-CM | POA: Diagnosis not present

## 2019-03-04 DIAGNOSIS — R0789 Other chest pain: Principal | ICD-10-CM | POA: Insufficient documentation

## 2019-03-04 DIAGNOSIS — K589 Irritable bowel syndrome without diarrhea: Secondary | ICD-10-CM | POA: Insufficient documentation

## 2019-03-04 DIAGNOSIS — Z7982 Long term (current) use of aspirin: Secondary | ICD-10-CM | POA: Insufficient documentation

## 2019-03-04 DIAGNOSIS — I200 Unstable angina: Secondary | ICD-10-CM | POA: Diagnosis present

## 2019-03-04 DIAGNOSIS — I10 Essential (primary) hypertension: Secondary | ICD-10-CM | POA: Diagnosis present

## 2019-03-04 DIAGNOSIS — I5030 Unspecified diastolic (congestive) heart failure: Secondary | ICD-10-CM | POA: Diagnosis present

## 2019-03-04 DIAGNOSIS — K219 Gastro-esophageal reflux disease without esophagitis: Secondary | ICD-10-CM | POA: Insufficient documentation

## 2019-03-04 DIAGNOSIS — Z8249 Family history of ischemic heart disease and other diseases of the circulatory system: Secondary | ICD-10-CM | POA: Insufficient documentation

## 2019-03-04 LAB — URINALYSIS, ROUTINE W REFLEX MICROSCOPIC
Bacteria, UA: NONE SEEN
Bilirubin Urine: NEGATIVE
Glucose, UA: NEGATIVE mg/dL
Hgb urine dipstick: NEGATIVE
Ketones, ur: NEGATIVE mg/dL
Nitrite: NEGATIVE
Protein, ur: NEGATIVE mg/dL
Specific Gravity, Urine: 1.011 (ref 1.005–1.030)
pH: 5 (ref 5.0–8.0)

## 2019-03-04 LAB — COMPREHENSIVE METABOLIC PANEL
ALT: 30 U/L (ref 0–44)
AST: 21 U/L (ref 15–41)
Albumin: 3.6 g/dL (ref 3.5–5.0)
Alkaline Phosphatase: 55 U/L (ref 38–126)
Anion gap: 8 (ref 5–15)
BUN: 14 mg/dL (ref 6–20)
CO2: 28 mmol/L (ref 22–32)
Calcium: 9.2 mg/dL (ref 8.9–10.3)
Chloride: 106 mmol/L (ref 98–111)
Creatinine, Ser: 0.95 mg/dL (ref 0.44–1.00)
GFR calc Af Amer: >60 mL/min (ref >60)
GFR calc non Af Amer: >60 mL/min (ref >60)
Glucose, Bld: 93 mg/dL (ref 70–99)
Potassium: 3.8 mmol/L (ref 3.5–5.1)
Sodium: 142 mmol/L (ref 135–145)
Total Bilirubin: 0.6 mg/dL (ref 0.3–1.2)
Total Protein: 6.7 g/dL (ref 6.5–8.1)

## 2019-03-04 LAB — LIPASE, BLOOD: Lipase: 58 U/L — ABNORMAL HIGH (ref 11–51)

## 2019-03-04 LAB — CBC
HCT: 38.5 % (ref 36.0–46.0)
Hemoglobin: 12.2 g/dL (ref 12.0–15.0)
MCH: 27.5 pg (ref 26.0–34.0)
MCHC: 31.7 g/dL (ref 30.0–36.0)
MCV: 86.7 fL (ref 80.0–100.0)
Platelets: 228 10*3/uL (ref 150–400)
RBC: 4.44 MIL/uL (ref 3.87–5.11)
RDW: 14 % (ref 11.5–15.5)
WBC: 6.6 10*3/uL (ref 4.0–10.5)
nRBC: 0 % (ref 0.0–0.2)

## 2019-03-04 LAB — I-STAT BETA HCG BLOOD, ED (MC, WL, AP ONLY): I-stat hCG, quantitative: <5 m[IU]/mL (ref <5)

## 2019-03-04 LAB — TROPONIN I (HIGH SENSITIVITY): Troponin I (High Sensitivity): 22 ng/L — ABNORMAL HIGH (ref <18)

## 2019-03-04 MED ORDER — SODIUM CHLORIDE 0.9% FLUSH: 3.0000 mL | Freq: Once | INTRAVENOUS | Status: DC

## 2019-03-04 NOTE — ED Triage Notes (Signed)
Pt reports abd pain since yesterday with some associated SOB. Reports CP, shoulder pain and back pain. Also endorses headache.

## 2019-03-05 ENCOUNTER — Observation Stay (HOSPITAL_COMMUNITY): Payer: Medicare Other

## 2019-03-05 ENCOUNTER — Emergency Department (HOSPITAL_COMMUNITY): Payer: Medicare Other

## 2019-03-05 ENCOUNTER — Ambulatory Visit: Payer: Medicare Other | Admitting: Cardiovascular Disease

## 2019-03-05 DIAGNOSIS — E669 Obesity, unspecified: Secondary | ICD-10-CM

## 2019-03-05 DIAGNOSIS — R0789 Other chest pain: Secondary | ICD-10-CM | POA: Diagnosis not present

## 2019-03-05 DIAGNOSIS — E1169 Type 2 diabetes mellitus with other specified complication: Secondary | ICD-10-CM | POA: Diagnosis not present

## 2019-03-05 DIAGNOSIS — I10 Essential (primary) hypertension: Secondary | ICD-10-CM

## 2019-03-05 DIAGNOSIS — I5032 Chronic diastolic (congestive) heart failure: Secondary | ICD-10-CM

## 2019-03-05 DIAGNOSIS — R079 Chest pain, unspecified: Secondary | ICD-10-CM

## 2019-03-05 LAB — TROPONIN I (HIGH SENSITIVITY)
Troponin I (High Sensitivity): 23 ng/L — ABNORMAL HIGH (ref <18)
Troponin I (High Sensitivity): 20 ng/L — ABNORMAL HIGH (ref <18)
Troponin I (High Sensitivity): 24 ng/L — ABNORMAL HIGH (ref <18)

## 2019-03-05 LAB — CBC WITH DIFFERENTIAL/PLATELET
Abs Immature Granulocytes: 0.01 10*3/uL (ref 0.00–0.07)
Basophils Absolute: 0 10*3/uL (ref 0.0–0.1)
Basophils Relative: 1 %
Eosinophils Absolute: 0.3 10*3/uL (ref 0.0–0.5)
Eosinophils Relative: 5 %
HCT: 38.4 % (ref 36.0–46.0)
Hemoglobin: 12.2 g/dL (ref 12.0–15.0)
Immature Granulocytes: 0 %
Lymphocytes Relative: 44 %
Lymphs Abs: 2.7 10*3/uL (ref 0.7–4.0)
MCH: 27.4 pg (ref 26.0–34.0)
MCHC: 31.8 g/dL (ref 30.0–36.0)
MCV: 86.3 fL (ref 80.0–100.0)
Monocytes Absolute: 0.4 10*3/uL (ref 0.1–1.0)
Monocytes Relative: 7 %
Neutro Abs: 2.6 10*3/uL (ref 1.7–7.7)
Neutrophils Relative %: 43 %
Platelets: 217 10*3/uL (ref 150–400)
RBC: 4.45 MIL/uL (ref 3.87–5.11)
RDW: 13.6 % (ref 11.5–15.5)
WBC: 6 10*3/uL (ref 4.0–10.5)
nRBC: 0 % (ref 0.0–0.2)

## 2019-03-05 LAB — LIPASE, BLOOD: Lipase: 47 U/L (ref 11–51)

## 2019-03-05 LAB — HIV ANTIBODY (ROUTINE TESTING W REFLEX): HIV Screen 4th Generation wRfx: NONREACTIVE

## 2019-03-05 LAB — COMPREHENSIVE METABOLIC PANEL
ALT: 28 U/L (ref 0–44)
AST: 18 U/L (ref 15–41)
Albumin: 3.3 g/dL — ABNORMAL LOW (ref 3.5–5.0)
Alkaline Phosphatase: 44 U/L (ref 38–126)
Anion gap: 12 (ref 5–15)
BUN: 11 mg/dL (ref 6–20)
CO2: 24 mmol/L (ref 22–32)
Calcium: 9 mg/dL (ref 8.9–10.3)
Chloride: 103 mmol/L (ref 98–111)
Creatinine, Ser: 0.7 mg/dL (ref 0.44–1.00)
GFR calc Af Amer: >60 mL/min (ref >60)
GFR calc non Af Amer: >60 mL/min (ref >60)
Glucose, Bld: 103 mg/dL — ABNORMAL HIGH (ref 70–99)
Potassium: 3.3 mmol/L — ABNORMAL LOW (ref 3.5–5.1)
Sodium: 139 mmol/L (ref 135–145)
Total Bilirubin: 0.5 mg/dL (ref 0.3–1.2)
Total Protein: 6 g/dL — ABNORMAL LOW (ref 6.5–8.1)

## 2019-03-05 LAB — BRAIN NATRIURETIC PEPTIDE: B Natriuretic Peptide: 33.2 pg/mL (ref 0.0–100.0)

## 2019-03-05 LAB — SARS CORONAVIRUS 2 (TAT 6-24 HRS): SARS Coronavirus 2: NEGATIVE

## 2019-03-05 LAB — GLUCOSE, CAPILLARY: Glucose-Capillary: 95 mg/dL (ref 70–99)

## 2019-03-05 LAB — TSH: TSH: 0.458 u[IU]/mL (ref 0.350–4.500)

## 2019-03-05 MED ORDER — PANTOPRAZOLE SODIUM 40 MG PO TBEC
40.0000 mg | DELAYED_RELEASE_TABLET | Freq: Two times a day (BID) | ORAL | Status: DC
  Administered 2019-03-05 – 2019-03-06 (×2): 40 mg via ORAL
  Filled 2019-03-05 (×2): qty 1

## 2019-03-05 MED ORDER — AMLODIPINE BESYLATE 5 MG PO TABS
5.0000 mg | ORAL_TABLET | Freq: Every day | ORAL | Status: DC
  Administered 2019-03-05 – 2019-03-06 (×2): 5 mg via ORAL
  Filled 2019-03-05 (×2): qty 1

## 2019-03-05 MED ORDER — ACETAMINOPHEN 325 MG PO TABS
650.0000 mg | ORAL_TABLET | Freq: Four times a day (QID) | ORAL | Status: DC | PRN
  Administered 2019-03-05 – 2019-03-06 (×2): 650 mg via ORAL
  Filled 2019-03-05 (×2): qty 2

## 2019-03-05 MED ORDER — OXYCODONE-ACETAMINOPHEN 5-325 MG PO TABS
2.0000 | ORAL_TABLET | Freq: Four times a day (QID) | ORAL | Status: DC | PRN
  Administered 2019-03-05: 2 via ORAL
  Filled 2019-03-05: qty 2

## 2019-03-05 MED ORDER — SERTRALINE HCL 100 MG PO TABS
100.0000 mg | ORAL_TABLET | Freq: Every day | ORAL | Status: DC
  Administered 2019-03-05 – 2019-03-06 (×2): 100 mg via ORAL
  Filled 2019-03-05 (×2): qty 1

## 2019-03-05 MED ORDER — FUROSEMIDE 40 MG PO TABS
40.0000 mg | ORAL_TABLET | Freq: Every day | ORAL | Status: DC
  Administered 2019-03-05 – 2019-03-06 (×2): 40 mg via ORAL
  Filled 2019-03-05: qty 2
  Filled 2019-03-05: qty 1

## 2019-03-05 MED ORDER — POLYETHYLENE GLYCOL 3350 17 G PO PACK: 17.0000 g | PACK | Freq: Every day | ORAL | Status: DC | PRN

## 2019-03-05 MED ORDER — MIRABEGRON ER 25 MG PO TB24
25.0000 mg | ORAL_TABLET | Freq: Every day | ORAL | Status: DC
  Administered 2019-03-05 – 2019-03-06 (×2): 25 mg via ORAL
  Filled 2019-03-05 (×2): qty 1

## 2019-03-05 MED ORDER — ISOSORBIDE DINITRATE 10 MG PO TABS
20.0000 mg | ORAL_TABLET | Freq: Two times a day (BID) | ORAL | Status: DC
  Administered 2019-03-05 – 2019-03-06 (×2): 20 mg via ORAL
  Filled 2019-03-05: qty 1
  Filled 2019-03-05 (×2): qty 2

## 2019-03-05 MED ORDER — SODIUM CHLORIDE 0.9% FLUSH
3.0000 mL | Freq: Two times a day (BID) | INTRAVENOUS | Status: DC
  Administered 2019-03-06: 3 mL via INTRAVENOUS

## 2019-03-05 MED ORDER — ASPIRIN 81 MG PO CHEW
324.0000 mg | CHEWABLE_TABLET | Freq: Once | ORAL | Status: AC
  Administered 2019-03-05: 324 mg via ORAL
  Filled 2019-03-05: qty 4

## 2019-03-05 MED ORDER — ACETAMINOPHEN 650 MG RE SUPP: 650.0000 mg | Freq: Four times a day (QID) | RECTAL | Status: DC | PRN

## 2019-03-05 MED ORDER — INSULIN ASPART 100 UNIT/ML ~~LOC~~ SOLN
0.0000 [IU] | Freq: Three times a day (TID) | SUBCUTANEOUS | Status: DC
  Administered 2019-03-06 (×2): 1 [IU] via SUBCUTANEOUS

## 2019-03-05 MED ORDER — ONDANSETRON HCL 4 MG PO TABS: 4.0000 mg | ORAL_TABLET | Freq: Four times a day (QID) | ORAL | Status: DC | PRN

## 2019-03-05 MED ORDER — ATORVASTATIN CALCIUM 40 MG PO TABS
40.0000 mg | ORAL_TABLET | Freq: Every day | ORAL | Status: DC
  Administered 2019-03-05: 40 mg via ORAL
  Filled 2019-03-05: qty 1

## 2019-03-05 MED ORDER — CARVEDILOL PHOSPHATE ER 10 MG PO CP24
10.0000 mg | ORAL_CAPSULE | Freq: Every day | ORAL | Status: DC
  Administered 2019-03-05 – 2019-03-06 (×2): 10 mg via ORAL
  Filled 2019-03-05 (×2): qty 1

## 2019-03-05 MED ORDER — ALBUTEROL SULFATE (2.5 MG/3ML) 0.083% IN NEBU: 2.5000 mg | INHALATION_SOLUTION | RESPIRATORY_TRACT | Status: DC | PRN

## 2019-03-05 MED ORDER — ENOXAPARIN SODIUM 40 MG/0.4ML ~~LOC~~ SOLN: 40.0000 mg | SUBCUTANEOUS | Status: DC

## 2019-03-05 MED ORDER — ALUM & MAG HYDROXIDE-SIMETH 200-200-20 MG/5ML PO SUSP
30.0000 mL | Freq: Once | ORAL | Status: AC
  Administered 2019-03-05: 30 mL via ORAL
  Filled 2019-03-05: qty 30

## 2019-03-05 MED ORDER — LIDOCAINE VISCOUS HCL 2 % MT SOLN
15.0000 mL | Freq: Once | OROMUCOSAL | Status: AC
  Administered 2019-03-05: 15 mL via ORAL
  Filled 2019-03-05: qty 15

## 2019-03-05 MED ORDER — ONDANSETRON HCL 4 MG/2ML IJ SOLN: 4.0000 mg | Freq: Four times a day (QID) | INTRAMUSCULAR | Status: DC | PRN

## 2019-03-05 MED ORDER — NITROGLYCERIN 0.4 MG SL SUBL
0.4000 mg | SUBLINGUAL_TABLET | SUBLINGUAL | Status: DC | PRN
  Administered 2019-03-05 (×2): 0.4 mg via SUBLINGUAL
  Filled 2019-03-05: qty 1

## 2019-03-05 MED ORDER — IOHEXOL 350 MG/ML SOLN
100.0000 mL | Freq: Once | INTRAVENOUS | Status: AC | PRN
  Administered 2019-03-05: 100 mL via INTRAVENOUS

## 2019-03-05 MED ORDER — LOPERAMIDE HCL 2 MG PO CAPS
4.0000 mg | ORAL_CAPSULE | Freq: Four times a day (QID) | ORAL | Status: DC | PRN
  Administered 2019-03-05: 4 mg via ORAL
  Filled 2019-03-05: qty 2

## 2019-03-05 MED ORDER — ALBUTEROL SULFATE (2.5 MG/3ML) 0.083% IN NEBU: 3.0000 mL | INHALATION_SOLUTION | Freq: Four times a day (QID) | RESPIRATORY_TRACT | Status: DC | PRN

## 2019-03-05 MED ORDER — LISINOPRIL 20 MG PO TABS
20.0000 mg | ORAL_TABLET | Freq: Every day | ORAL | Status: DC
  Administered 2019-03-05 – 2019-03-06 (×2): 20 mg via ORAL
  Filled 2019-03-05 (×2): qty 1

## 2019-03-05 NOTE — H&P (Signed)
History and Physical  [name] [id number] DOB: [birth year] DOA: 03/04/2019   Patient coming from: Home & is able to ambulate  Chief Complaint: Chest pain  HPI: [name] is a 58 y.o. female with medical history significant for diabetes mellitus type 2, hypertension, CHF, TIA, presents to the ED complaining about chest discomfort for the past 2 to 3 days.  Chest pain is described as a pressure sensation across her chest, associated shortness of breath, dizziness and diaphoresis, pain scale about 7-8 over 10, occurs both at rest as well as exertion.  Patient reports generalized fatigue.  Denies any abdominal pain, diarrhea, fever/chills, any sick contacts.  ED Course: In the ED, vital signs somewhat stable except for elevated blood pressure, labs showed troponin 22--> 23, with a flat trend, chest x-ray with minimal scarring or atelectasis in the lingula, CTA chest negative for PE, possible pulmonary arterial hypertension, unchanged right thyroid goiter.  Patient with a heart score of 6.  EDP discussed with cardiology, recommend for observation for chest pain.  Review of Systems: Review of systems are otherwise negative   Past Medical History:  Diagnosis Date  . Anginal pain ([place])    a. NL cath in 2008;  b. Myoview 03/2011: dec uptake along mid anterior wall on stress imaging -> ? attenuation vs. ischemia, EF 65%;  c. Echo 04/2011: EF 55-60%, no RWMA, Gr 2 dd  . Anxiety   . Arthritis   . Asthma   . Back pain   . Bone cancer ([place])   . Cancer ([place])   . Chest pain   . CHF (congestive heart failure) ([place])   . CHF (congestive heart failure) ([place])   . Depression   . Diabetes mellitus   . Drug use   . Dyspnea    with exertion  . Dyspnea   . Frequent urination   . GERD (gastroesophageal reflux disease)   . Glaucoma   . HLD (hyperlipidemia)   . Hypertension   . IBS (irritable bowel syndrome)   . Joint pain   . Lactose intolerance   . Leg edema   . 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 Obesity   . Palpitations   . Pneumonia 05/2016   double  . Pulmonary edema   . Pulmonary embolism ([place])    a. 2008 -> coumadin x 6 mos.  . Rheumatoid arthritis ([place])   . Sleep apnea    on CPAP 02/2018  . TIA (transient ischemic attack)   . Urinary urgency    Past Surgical History:  Procedure Laterality Date  . ABDOMINAL HYSTERECTOMY  2005  . APPENDECTOMY    . CARDIAC CATHETERIZATION     Normal  . CARDIAC CATHETERIZATION N/A 09/30/2014   Procedure: Left Heart Cath and Coronary Angiography;  Surgeon: Sherren Mocha, MD;  Location: [facility];  Service: Cardiovascular;  Laterality: N/A;  . LAPAROSCOPIC APPENDECTOMY N/A 06/03/2012   Procedure: APPENDECTOMY LAPAROSCOPIC;  Surgeon: Stark Klein, MD;  Location: [facility];  Service: General;  Laterality: N/A;  . Left knee surgery  2008  . LEG SURGERY    . TONSILLECTOMY    . TOTAL HIP ARTHROPLASTY Left 06/11/2018   Procedure: LEFT TOTAL HIP ARTHROPLASTY ANTERIOR APPROACH;  Surgeon: Meredith Pel, MD;  Location: [facility];  Service: Orthopedics;  Laterality: Left;  . TOTAL KNEE ARTHROPLASTY Left 08/22/2016   Procedure: TOTAL KNEE ARTHROPLASTY;  Surgeon: Meredith Pel, MD;  Location: [facility];  Service: Orthopedics;  Laterality: Left;  . TUBAL LIGATION  1989    Social History:  reports that she quit smoking about 33 years ago. Her smoking use included cigarettes. She has a 7.50 pack-year smoking history. She has never used smokeless tobacco. She reports previous drug use. Drug: Marijuana. She reports that she does not drink alcohol.   Allergies  Allergen Reactions  . Penicillins Swelling and Other (See Comments)    Has patient had a PCN reaction causing immediate rash, facial/tongue/throat swelling, SOB or lightheadedness with hypotension: YES Has patient had a PCN reaction causing severe rash involving mucus membranes or skin necrosis: NO Has patient had a PCN reaction that required  hospitalization NO Has patient had a PCN reaction occurring within the last 10 years: NO If all of the above answers are "NO", then may proceed with Cephalosporin use.  [name] Hydrocodone-Acetaminophen Itching    confusion  . Latex Rash  . Sulfa Antibiotics Diarrhea, Itching and Rash    Family History  Problem Relation Age of Onset  . Emphysema Mother   . Arthritis Mother   . Heart failure Mother        alive @ 36  . Stroke Mother   . Diabetes Mother   . Hypertension Mother   . Hyperlipidemia Mother   . Depression Mother   . Anxiety disorder Mother   . Asthma Brother   . Heart disease Father        died @ 35's.  . Stroke Father   . Diabetes Father   . Hyperlipidemia Father   . Hypertension Father   . Bipolar disorder Father   . Sleep apnea Father   . Alcoholism Father   . Drug abuse Father   . Diabetes Sister   . Heart disease Paternal Grandfather   . Colon cancer Maternal Grandfather   . Breast cancer Maternal Aunt       Prior to Admission medications   Medication Sig Start Date End Date Taking? Authorizing Provider  albuterol (PROVENTIL HFA;VENTOLIN HFA) 108 (90 Base) MCG/ACT inhaler Inhale 2 puffs into the lungs every 6 (six) hours as needed for wheezing or shortness of breath.   Yes [provider]  amLODipine (NORVASC) 5 MG tablet Take 5 mg by mouth daily. 06/08/17  Yes [provider]  aspirin 81 MG chewable tablet Chew 81 mg by mouth daily.   Yes [provider]  atorvastatin (LIPITOR) 40 MG tablet Take 40 mg by mouth at bedtime.    Yes [provider]  busPIRone (BUSPAR) 15 MG tablet Take 15 mg by mouth 2 (two) times daily. 02/25/19  Yes [provider]  carvedilol (COREG CR) 10 MG 24 hr capsule Take 10 mg by mouth daily.   Yes [provider]  diclofenac sodium (VOLTAREN) 1 % GEL Apply 2 g topically 4 (four) times daily. 02/05/19  Yes [provider]  escitalopram (LEXAPRO) 20 MG tablet Take 20 mg by  mouth daily. 02/21/19  Yes [provider]  furosemide (LASIX) 40 MG tablet Take 40 mg by mouth daily.   Yes Rai, Ripudeep K, MD  hydrOXYzine (VISTARIL) 25 MG capsule Take 25 mg by mouth 2 (two) times daily as needed for itching. 01/06/19  Yes [provider]  isosorbide dinitrate (ISORDIL) 20 MG tablet Take 20 mg by mouth 2 (two) times daily.    Yes [provider]  JANUMET XR 50-500 MG TB24 TAKE 1 TABLET BY MOUTH EVERY DAY Patient taking differently:  Take 1 tablet by mouth daily.  01/20/19  Yes Whitmire, Dawn W, FNP  lisinopril (PRINIVIL,ZESTRIL) 20 MG tablet Take 20 mg by mouth daily.  09/21/14  Yes [provider]  loperamide (IMODIUM A-D) 2 MG tablet Take 4 mg by mouth 4 (four) times daily as needed for diarrhea or loose stools.    Yes [provider]  meloxicam (MOBIC) 15 MG tablet TAKE 1 TABLET BY MOUTH EVERY DAY AS NEEDED Patient taking differently: Take 15 mg by mouth daily as needed for pain.  02/19/19  Yes Magnant, Charles L, PA-C  MYRBETRIQ 25 MG TB24 tablet TAKE 1 TABLET BY MOUTH EVERY DAY Patient taking differently: Take 25 mg by mouth daily.  02/10/19  Yes Whitmire, Dawn W, FNP  omeprazole (PRILOSEC) 20 MG capsule Take 20 mg by mouth 2 (two) times daily. 10/05/16  Yes [provider]  potassium chloride (KLOR-CON M10) 10 MEQ tablet Take 1 tablet (10 mEq total) by mouth 2 (two) times daily. 05/27/18  Yes Jearld Lesch A, DO  sertraline (ZOLOFT) 100 MG tablet Take 100 mg by mouth daily.    Yes [provider]  Vaginal Moisturizer ([employer] FEMININE MOISTURIZER) GEL Place 1 applicator vaginally every three (3) days as needed (vaginal dryness).  02/21/19  Yes [provider]  Vitamin D, Ergocalciferol, (DRISDOL) 1.25 MG (50000 UT) CAPS capsule Take 1 capsule (50,000 Units total) by mouth every 7 (seven) days. Patient taking differently: Take 50,000 Units by mouth every 7 (seven) days. Wednesdays 02/19/19  Yes Whitmire, Dawn W,  FNP  acetaminophen (TYLENOL) 325 MG tablet Take 1-2 tablets (325-650 mg total) by mouth every 6 (six) hours as needed for mild pain (pain score 1-3 or temp > 100.5). Patient not taking: Reported on 03/05/2019 06/14/18   Meredith Pel, MD  lidocaine (LIDODERM) 5 % Place 1 patch onto the skin daily. Remove & Discard patch within 12 hours or as directed by MD Patient not taking: Reported on 03/05/2019 03/06/18   Meredith Pel, MD  oxyCODONE-acetaminophen (PERCOCET/ROXICET) 5-325 MG tablet Take 2 tablets by mouth every 6 (six) hours as needed for severe pain. Patient not taking: Reported on 03/05/2019 09/02/18   Hayden Rasmussen, MD  KOMBIGLYZE XR 5-500 MG TB24 Take 1 tablet by mouth daily.  10/11/15 11/26/18  [provider]    Physical Exam: BP (!) 188/83 (BP Location: Left Arm)   Pulse 66   Temp 98 F (36.7 C) (Oral)   Resp 17   Wt 95.3 kg   SpO2 96%   BMI 43.89 kg/m   General: NAD, obese Eyes: Normal ENT: Normal Neck: Supple Cardiovascular: S1, S2 present Respiratory: CTA B Abdomen: Soft, nontender, nondistended, bowel sounds present Skin: Normal Musculoskeletal: No pedal edema bilaterally Psychiatric: Normal mood Neurologic: No obvious focal neurologic deficits noted          Labs on Admission:  Basic Metabolic Panel: Recent Labs  Lab 03/04/19 2041 03/05/19 1811  NA 142 139  K 3.8 3.3*  CL 106 103  CO2 28 24  GLUCOSE 93 103*  BUN 14 11  CREATININE 0.95 0.70  CALCIUM 9.2 9.0   Liver Function Tests: Recent Labs  Lab 03/04/19 2041 03/05/19 1811  AST 21 18  ALT 30 28  ALKPHOS 55 44  BILITOT 0.6 0.5  PROT 6.7 6.0*  ALBUMIN 3.6 3.3*   Recent Labs  Lab 03/04/19 2041 03/05/19 1811  LIPASE 58* 47   No results for input(s): AMMONIA in the last 168  hours. CBC: Recent Labs  Lab 03/04/19 2041 03/05/19 1811  WBC 6.6 6.0  NEUTROABS  --  2.6  HGB 12.2 12.2  HCT 38.5 38.4  MCV 86.7 86.3  PLT 228 217   Cardiac Enzymes: No results for  input(s): CKTOTAL, CKMB, CKMBINDEX, TROPONINI in the last 168 hours.  BNP (last 3 results) Recent Labs    09/02/18 0447 11/18/18 2043 03/05/19 1811  BNP 76.2 26.4 33.2    ProBNP (last 3 results) No results for input(s): PROBNP in the last 8760 hours.  CBG: Recent Labs  Lab 03/05/19 1959  GLUCAP 95    Radiological Exams on Admission: Dg Chest 2 View  Result Date: 03/04/2019 CLINICAL DATA:  Chest pain EXAM: CHEST - 2 VIEW COMPARISON:  November 18, 2018 FINDINGS: There is mild cardiomegaly. There is mild prominence of the central pulmonary vasculature. No large airspace consolidation or pleural effusion. No acute osseous abnormality. IMPRESSION: Mild cardiomegaly and pulmonary vascular congestion. Electronically Signed   By: Prudencio Pair M.D.   On: 03/04/2019 21:08   Ct Angio Chest Pe W And/or Wo Contrast  Result Date: 03/05/2019 CLINICAL DATA:  Shortness of breath EXAM: CT ANGIOGRAPHY CHEST WITH CONTRAST TECHNIQUE: Multidetector CT imaging of the chest was performed using the standard protocol during bolus administration of intravenous contrast. Multiplanar CT image reconstructions and MIPs were obtained to evaluate the vascular anatomy. CONTRAST:  135mL OMNIPAQUE IOHEXOL 350 MG/ML SOLN COMPARISON:  09/02/2018 FINDINGS: Cardiovascular: Satisfactory opacification of the pulmonary arteries to the segmental level. No evidence of pulmonary embolism. Borderline enlargement of the pulmonary trunk, similar to prior, suggesting underlying pulmonary arterial hypertension. Stable mild cardiomegaly. No pericardial effusion. Mediastinum/Nodes: Enlarged, heterogeneous right thyroid lobe with extension to the superior mediastinum, unchanged. Well-circumscribed 2.6 x 1.6 cm soft tissue mass in the prevascular space is unchanged from prior. No axillary or hilar lymphadenopathy. Trachea and esophagus within normal limits. Lungs/Pleura: Respiratory motion artifact degrades evaluation. Mild atelectasis within  the lingula and right middle lobe. No focal airspace consolidation, pleural effusion, or pneumothorax. Upper Abdomen: No acute abnormality. Musculoskeletal: No chest wall abnormality. No acute or significant osseous findings. Review of the MIP images confirms the above findings. IMPRESSION: 1. Negative for pulmonary embolism. 2. Mild atelectasis within the lingula and right middle lobe. Lungs otherwise clear. 3. Borderline enlargement of the pulmonary trunk suggesting underlying pulmonary arterial hypertension. 4. Stable benign 2.6 cm soft tissue mass in the prevascular space, which has been present on studies dating back to 2004. 5. Unchanged right thyroid goiter. 6. Stable cardiomegaly. Electronically Signed   By: Davina Poke M.D.   On: 03/05/2019 15:25   Dg Chest Port 1 View  Result Date: 03/05/2019 CLINICAL DATA:  Shortness of breath, chest pain, and abdominal pain. EXAM: PORTABLE CHEST 1 VIEW COMPARISON:  Chest x-ray dated 03/04/2019 and chest CT angiogram 03/05/2019 FINDINGS: Heart size is normal. The main pulmonary artery in the left pulmonary artery are chronically prominent. Minimal atelectasis or scarring in the lingula, unchanged since the prior CT scan. No infiltrates or effusions. No acute bone abnormality. IMPRESSION: 1. Minimal scarring or atelectasis in the lingula. No change since the prior CT scan. 2. Chronic prominence of the main and left pulmonary artery. Electronically Signed   By: Lorriane Shire M.D.   On: 03/05/2019 17:46    EKG: Independently reviewed.  No acute ST changes  Assessment/Plan Present on Admission: . Chest pain . Diabetes mellitus type 2 in obese ([place]) . HTN (hypertension) . Diastolic CHF ([place])  Principal Problem:   Chest pain Active Problems:   Diastolic CHF ([facility])   Diabetes mellitus type 2 in obese ([facility])   HTN (hypertension)  Atypical chest pain Troponin very mildly elevated with a flat trend EKG with no acute ST changes Heart score of 6,  recommend observation LDL 41 Echo done 04/2016 showed EF of 55 to 123456, grade 2 diastolic dysfunction, repeat echo pending May consult cardiology in a.m. if echo shows reduced EF Monitor on telemetry  Chronic diastolic HF Appears compensated BNP 33.2 Chest x-ray with scarring noted, not in acute Repeat echo pending Continue Coreg CR, Lasix, isordil  Hypertension Somewhat uncontrolled Continue amlodipine, Coreg CR, lisinopril  Diabetes mellitus type 2 Last A1c 6.3 on 12/26/2018 SSI, hypoglycemic protocol, Accu-Cheks  Hyperlipidemia Continue Lipitor  GERD Continue PPI  Depression Continue Zoloft  Morbid obesity Lifestyle modification advised        DVT prophylaxis: Lovenox  Code Status: Full  Family Communication: None at bedside  Disposition Plan: To be determined  Consults called: None  Admission status: Observation    Alma Friendly MD [facility]   If 7PM-7AM, please contact night-coverage [email or web address]  03/05/2019, 8:14 PM

## 2019-03-05 NOTE — ED Provider Notes (Signed)
16:30: Assumed care of patient from Domenic Moras PA-C @ change of shift pending consultation for admission.   Please see prior provider note for full H&P.   Briefly patient is a 58 yo female with a hx of CHF, T2DM, HTN, hyperlipidemia, obesity, & prior PE who presented to the ED w/ complaints of chest pain with associated nausea, diaphoresis, dizziness, & dyspnea. Has not had much improvement s/p aspirin, nitroglycerin, & GI cocktail in the ED. EKG w/ inferolateral inverted T waves that have been present previously, troponins mildly elevated 22, 23, CTA negative for PE. Heart Pathway Score 6.  Prior team discussed w/ cardiology who felt less likely to be significant cardiac event after review of EKG/troponins & prior cath, prior team also discussed with family medicine- not their patient. Plan @ change of shift is consult to [facility] for chest pain obs.  16:55: CONSULT: Discussed with Dr. Horris Latino with [facility]- accepts admission.      Amaryllis Dyke, PA-C 03/05/19 1705    Little, Wenda Overland, MD 03/05/19 1723

## 2019-03-05 NOTE — ED Provider Notes (Signed)
[facility] EMERGENCY DEPARTMENT Provider Note   CSN: [id number] Arrival date & time: 03/04/19  2000     History   Chief Complaint Chief Complaint  Patient presents with  . Chest Pain  . Abdominal Pain    HPI [name] is a 58 y.o. female.     The history is provided by the patient and medical records. No language interpreter was used.  Chest Pain Associated symptoms: abdominal pain   Abdominal Pain Associated symptoms: chest pain      58 year old with history of diabetes, CHF, prior PE was on Coumadin, hypertension, anginal pain presenting for evaluation of chest pain.  For the past 2 to 3 days patient has been experiencing chest discomfort.  She described as a pressure sensation across her chest to her upper abdomen with associated lightheadedness, dizziness, shortness of breath, headache, and mild nausea with diaphoresis.  Pain is been waxing waning and rates as 8 out of 10.  Pain is present both at rest and with exertion.  She notes her blood pressure has been high despite being on medication.  She mention pain does quickly radiates to her back.  She tries to wait out but symptoms still persist.  She endorsed generalized fatigue.  She has nausea without vomiting.  She denies alcohol or drug use.  She has not noticed any increased fluid gain.      Past Medical History:  Diagnosis Date  . Anginal pain ([place])    a. NL cath in 2008;  b. Myoview 03/2011: dec uptake along mid anterior wall on stress imaging -> ? attenuation vs. ischemia, EF 65%;  c. Echo 04/2011: EF 55-60%, no RWMA, Gr 2 dd  . Anxiety   . Arthritis   . Asthma   . Back pain   . Bone cancer ([place])   . Cancer ([place])   . Chest pain   . CHF (congestive heart failure) ([place])   . CHF (congestive heart failure) ([place])   . Depression   . Diabetes mellitus   . Drug use   . Dyspnea    with exertion  . Dyspnea   . Frequent urination   . GERD (gastroesophageal reflux disease)   .  Glaucoma   . HLD (hyperlipidemia)   . Hypertension   . IBS (irritable bowel syndrome)   . Joint pain   . Lactose intolerance   . Leg edema   . 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 Obesity   . Palpitations   . Pneumonia 05/2016   double  . Pulmonary edema   . Pulmonary embolism ([place])    a. 2008 -> coumadin x 6 mos.  . Rheumatoid arthritis ([place])   . Sleep apnea    on CPAP 02/2018  . TIA (transient ischemic attack)   . Urinary urgency     Patient Active Problem List   Diagnosis Date Noted  . Unilateral primary osteoarthritis, left hip   . Hip arthritis 06/11/2018  . Class 3 severe obesity with serious comorbidity and body mass index (BMI) of 40.0 to 44.9 in adult ([place]) 05/29/2018  . Other fatigue 11/20/2017  . Shortness of breath on exertion 11/20/2017  . Type 2 diabetes mellitus without complication, without long-term current use of insulin ([place]) 11/20/2017  . Vitamin D deficiency 11/20/2017  . Depression 11/20/2017  . Other hyperlipidemia 11/20/2017  . 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 Presence of left artificial knee joint  09/20/2016  . Arthritis of knee 08/22/2016  . Cervical radiculopathy 07/26/2016  . Cervical disc disorder with radiculopathy 07/26/2016  . Right arm pain 06/29/2016  . Cervicalgia 06/29/2016  . Primary osteoarthritis of left knee 06/29/2016  . Hypersomnia with sleep apnea 11/18/2015  . Lethargy 11/18/2015  . Chest pain 09/30/2014  . Abnormal cardiac function test 09/29/2014  . Chest pain with moderate risk for cardiac etiology 09/28/2014  . HTN (hypertension) 09/28/2014  . Hypokalemia   . Paresthesia 06/18/2014  . Numbness and tingling of left arm and leg   . Unstable angina ([place]) 05/24/2014  . OSA on CPAP 05/24/2014  . Diabetes mellitus type 2 in obese ([place]) 10/29/2012  . S/P laparoscopic appendectomy 06/04/2012  . Diastolic CHF ([place]) [id number]  . Mediastinal mass 01/09/2012  . SOB (shortness of breath) 06/20/2011   . Mediastinal abnormality 06/20/2011  . Dyslipidemia 12/23/2009  . GLAUCOMA 12/23/2009  . ARTHRITIS 12/23/2009  . 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 Morbid obesity-BMI 45 09/13/2006  . ANXIETY STATE NOS 09/13/2006  . DISORDER, DEPRESSIVE NEC 09/13/2006  . CARPAL TUNNEL SYNDROME, MILD 09/13/2006  . Unspecified essential hypertension 09/13/2006  . IBS 09/13/2006  . DEGENERATION, LUMBAR/LUMBOSACRAL DISC 09/13/2006  . SYMPTOM, SWELLING/MASS/LUMP IN CHEST 09/13/2006  . PULMONARY EMBOLISM, HX OF 09/13/2006    Past Surgical History:  Procedure Laterality Date  . ABDOMINAL HYSTERECTOMY  2005  . APPENDECTOMY    . CARDIAC CATHETERIZATION     Normal  . CARDIAC CATHETERIZATION N/A 09/30/2014   Procedure: Left Heart Cath and Coronary Angiography;  Surgeon: Sherren Mocha, MD;  Location: [facility];  Service: Cardiovascular;  Laterality: N/A;  . LAPAROSCOPIC APPENDECTOMY N/A 06/03/2012   Procedure: APPENDECTOMY LAPAROSCOPIC;  Surgeon: Stark Klein, MD;  Location: [facility];  Service: General;  Laterality: N/A;  . Left knee surgery  2008  . LEG SURGERY    . TONSILLECTOMY    . TOTAL HIP ARTHROPLASTY Left 06/11/2018   Procedure: LEFT TOTAL HIP ARTHROPLASTY ANTERIOR APPROACH;  Surgeon: Meredith Pel, MD;  Location: [facility];  Service: Orthopedics;  Laterality: Left;  . TOTAL KNEE ARTHROPLASTY Left 08/22/2016   Procedure: TOTAL KNEE ARTHROPLASTY;  Surgeon: Meredith Pel, MD;  Location: [facility];  Service: Orthopedics;  Laterality: Left;  . TUBAL LIGATION  1989     OB History    Gravida  3   Para      Term      Preterm      AB      Living        SAB      TAB      Ectopic      Multiple      Live Births               Home Medications    Prior to Admission medications   Medication Sig Start Date End Date Taking? Authorizing Provider  acetaminophen (TYLENOL) 325 MG tablet Take 1-2 tablets (325-650 mg total) by mouth every 6 (six) hours as needed for mild pain (pain  score 1-3 or temp > 100.5). 06/14/18   Meredith Pel, MD  albuterol (PROVENTIL HFA;VENTOLIN HFA) 108 (90 Base) MCG/ACT inhaler Inhale 2 puffs into the lungs every 6 (six) hours as needed for wheezing or shortness of breath.    [provider]  amLODipine (NORVASC) 5 MG tablet Take 5 mg by mouth daily. 06/08/17   [provider]  atorvastatin (LIPITOR) 40 MG tablet Take 40 mg by mouth  at bedtime.     [provider]  carvedilol (COREG CR) 10 MG 24 hr capsule Take 10 mg by mouth daily.    [provider]  clindamycin (CLEOCIN) 300 MG capsule Take 2 capsules 30-60 minutes prior to dental procedure. 02/25/19   Magnant, Charles L, PA-C  furosemide (LASIX) 40 MG tablet Take 40 mg by mouth daily.    Rai, Ripudeep K, MD  isosorbide dinitrate (ISORDIL) 20 MG tablet Take 20 mg by mouth 2 (two) times daily.     [provider]  JANUMET XR 50-500 MG TB24 TAKE 1 TABLET BY MOUTH EVERY DAY 01/20/19   Whitmire, Arrie Aran W, FNP  lidocaine (LIDODERM) 5 % Place 1 patch onto the skin daily. Remove & Discard patch within 12 hours or as directed by MD Patient taking differently: Place 1 patch onto the skin daily as needed (for pain). Remove & Discard patch within 12 hours or as directed by MD 03/06/18   Meredith Pel, MD  lisinopril (PRINIVIL,ZESTRIL) 20 MG tablet Take 20 mg by mouth daily.  09/21/14   [provider]  loperamide (IMODIUM A-D) 2 MG tablet Take 4 mg by mouth 4 (four) times daily as needed for diarrhea or loose stools.     [provider]  meloxicam (MOBIC) 15 MG tablet TAKE 1 TABLET BY MOUTH EVERY DAY AS NEEDED 02/19/19   Magnant, Charles L, PA-C  MYRBETRIQ 25 MG TB24 tablet TAKE 1 TABLET BY MOUTH EVERY DAY 02/10/19   Whitmire, Arrie Aran W, FNP  omeprazole (PRILOSEC) 20 MG capsule Take 20 mg by mouth 2 (two) times daily. 10/05/16   [provider]  oxyCODONE-acetaminophen (PERCOCET/ROXICET) 5-325 MG tablet Take 2 tablets by mouth every 6  (six) hours as needed for severe pain. 09/02/18   Hayden Rasmussen, MD  potassium chloride (KLOR-CON M10) 10 MEQ tablet Take 1 tablet (10 mEq total) by mouth 2 (two) times daily. 05/27/18   Jearld Lesch A, DO  sertraline (ZOLOFT) 100 MG tablet Take 100 mg by mouth daily.     [provider]  Vitamin D, Ergocalciferol, (DRISDOL) 1.25 MG (50000 UT) CAPS capsule Take 1 capsule (50,000 Units total) by mouth every 7 (seven) days. 02/19/19   Whitmire, Dawn W, FNP  KOMBIGLYZE XR 5-500 MG TB24 Take 1 tablet by mouth daily.  10/11/15 11/26/18  [provider]    Family History Family History  Problem Relation Age of Onset  . Emphysema Mother   . Arthritis Mother   . Heart failure Mother        alive @ 31  . Stroke Mother   . Diabetes Mother   . Hypertension Mother   . Hyperlipidemia Mother   . Depression Mother   . Anxiety disorder Mother   . Asthma Brother   . Heart disease Father        died @ 40's.  . Stroke Father   . Diabetes Father   . Hyperlipidemia Father   . Hypertension Father   . Bipolar disorder Father   . Sleep apnea Father   . Alcoholism Father   . Drug abuse Father   . Diabetes Sister   . Heart disease Paternal Grandfather   . Colon cancer Maternal Grandfather   . Breast cancer Maternal Aunt     Social History Social History   Tobacco Use  . Smoking status: Former Smoker    Packs/day: 0.50    Years: 15.00    Pack years: 7.50  Types: Cigarettes    Quit date: 04/24/1985    Years since quitting: 33.8  . Smokeless tobacco: Never Used  Substance Use Topics  . Alcohol use: No    Alcohol/week: 0.0 standard drinks  . Drug use: Not Currently    Types: Marijuana     Allergies   Penicillins, Hydrocodone-acetaminophen, Latex, and Sulfa antibiotics   Review of Systems Review of Systems  Cardiovascular: Positive for chest pain.  Gastrointestinal: Positive for abdominal pain.  All other systems reviewed and are negative.    Physical  Exam Updated Vital Signs BP (!) 172/94 (BP Location: Right Arm)   Pulse 71   Temp 97.9 F (36.6 C) (Oral)   Resp 16   SpO2 97%   Physical Exam Vitals signs and nursing note reviewed.  Constitutional:      General: She is not in acute distress.    Appearance: She is well-developed. She is obese.  HENT:     Head: Atraumatic.  Eyes:     Conjunctiva/sclera: Conjunctivae normal.  Neck:     Musculoskeletal: Neck supple.  Cardiovascular:     Rate and Rhythm: Normal rate and regular rhythm.     Heart sounds: Normal heart sounds.  Pulmonary:     Effort: Pulmonary effort is normal.     Breath sounds: Normal breath sounds.  Abdominal:     Palpations: Abdomen is soft.     Tenderness: There is no abdominal tenderness.  Musculoskeletal:     Right lower leg: No edema.     Left lower leg: No edema.  Skin:    General: Skin is warm.     Findings: No rash.  Neurological:     General: No focal deficit present.     Mental Status: She is alert.  Psychiatric:        Mood and Affect: Mood normal.      ED Treatments / Results  Labs (all labs ordered are listed, but only abnormal results are displayed) Labs Reviewed  LIPASE, BLOOD - Abnormal; Notable for the following components:      Result Value   Lipase 58 (*)    All other components within normal limits  URINALYSIS, ROUTINE W REFLEX MICROSCOPIC - Abnormal; Notable for the following components:   Color, Urine STRAW (*)    Leukocytes,Ua TRACE (*)    All other components within normal limits  TROPONIN I (HIGH SENSITIVITY) - Abnormal; Notable for the following components:   Troponin I (High Sensitivity) 22 (*)    All other components within normal limits  TROPONIN I (HIGH SENSITIVITY) - Abnormal; Notable for the following components:   Troponin I (High Sensitivity) 23 (*)    All other components within normal limits  SARS CORONAVIRUS 2 (TAT 6-24 HRS)  COMPREHENSIVE METABOLIC PANEL  CBC  BRAIN NATRIURETIC PEPTIDE  I-STAT BETA  HCG BLOOD, ED ([facility], [facility], [facility] ONLY)    EKG EKG Interpretation  Date/Time:  Tuesday March 04 2019 20:27:05 EST Ventricular Rate:  71 PR Interval:  186 QRS Duration: 106 QT Interval:  386 QTC Calculation: 419 R Axis:   99 Text Interpretation: Normal sinus rhythm Rightward axis Septal infarct , age undetermined ST & T wave abnormality, consider inferolateral ischemia Abnormal ECG When compared with ECG of 11/18/2018, No significant change was found Confirmed by Delora Fuel ([id number]) on 03/05/2019 1:11:01 AM   Radiology Dg Chest 2 View  Result Date: 03/04/2019 CLINICAL DATA:  Chest pain EXAM: CHEST - 2 VIEW COMPARISON:  November 18, 2018 FINDINGS:  There is mild cardiomegaly. There is mild prominence of the central pulmonary vasculature. No large airspace consolidation or pleural effusion. No acute osseous abnormality. IMPRESSION: Mild cardiomegaly and pulmonary vascular congestion. Electronically Signed   By: Prudencio Pair M.D.   On: 03/04/2019 21:08   Ct Angio Chest Pe W And/or Wo Contrast  Result Date: 03/05/2019 CLINICAL DATA:  Shortness of breath EXAM: CT ANGIOGRAPHY CHEST WITH CONTRAST TECHNIQUE: Multidetector CT imaging of the chest was performed using the standard protocol during bolus administration of intravenous contrast. Multiplanar CT image reconstructions and MIPs were obtained to evaluate the vascular anatomy. CONTRAST:  142mL OMNIPAQUE IOHEXOL 350 MG/ML SOLN COMPARISON:  09/02/2018 FINDINGS: Cardiovascular: Satisfactory opacification of the pulmonary arteries to the segmental level. No evidence of pulmonary embolism. Borderline enlargement of the pulmonary trunk, similar to prior, suggesting underlying pulmonary arterial hypertension. Stable mild cardiomegaly. No pericardial effusion. Mediastinum/Nodes: Enlarged, heterogeneous right thyroid lobe with extension to the superior mediastinum, unchanged. Well-circumscribed 2.6 x 1.6 cm soft tissue mass in the prevascular space is unchanged  from prior. No axillary or hilar lymphadenopathy. Trachea and esophagus within normal limits. Lungs/Pleura: Respiratory motion artifact degrades evaluation. Mild atelectasis within the lingula and right middle lobe. No focal airspace consolidation, pleural effusion, or pneumothorax. Upper Abdomen: No acute abnormality. Musculoskeletal: No chest wall abnormality. No acute or significant osseous findings. Review of the MIP images confirms the above findings. IMPRESSION: 1. Negative for pulmonary embolism. 2. Mild atelectasis within the lingula and right middle lobe. Lungs otherwise clear. 3. Borderline enlargement of the pulmonary trunk suggesting underlying pulmonary arterial hypertension. 4. Stable benign 2.6 cm soft tissue mass in the prevascular space, which has been present on studies dating back to 2004. 5. Unchanged right thyroid goiter. 6. Stable cardiomegaly. Electronically Signed   By: Davina Poke M.D.   On: 03/05/2019 15:25    Procedures .Critical Care Performed by: Domenic Moras, PA-C Authorized by: Domenic Moras, PA-C   Critical care provider statement:    Critical care time (minutes):  45   Critical care was time spent personally by me on the following activities:  Discussions with consultants, evaluation of patient's response to treatment, examination of patient, ordering and performing treatments and interventions, ordering and review of laboratory studies, ordering and review of radiographic studies, pulse oximetry, re-evaluation of patient's condition, obtaining history from patient or surrogate and review of old charts   (including critical care time)  Medications Ordered in ED Medications  sodium chloride flush (NS) 0.9 % injection 3 mL (has no administration in time range)  nitroGLYCERIN (NITROSTAT) SL tablet 0.4 mg (0.4 mg Sublingual Given 03/05/19 1306)  alum & mag hydroxide-simeth (MAALOX/MYLANTA) 200-200-20 MG/5ML suspension 30 mL (has no administration in time range)     And  lidocaine (XYLOCAINE) 2 % viscous mouth solution 15 mL (has no administration in time range)  aspirin chewable tablet 324 mg (324 mg Oral Given 03/05/19 1306)  iohexol (OMNIPAQUE) 350 MG/ML injection 100 mL (100 mLs Intravenous Contrast Given 03/05/19 1513)     Initial Impression / Assessment and Plan / ED Course  I have reviewed the triage vital signs and the nursing notes.  Pertinent labs & imaging results that were available during my care of the patient were reviewed by me and considered in my medical decision making (see chart for details).        BP (!) 160/76 (BP Location: Right Arm)   Pulse 67   Temp 97.9 F (36.6 C) (Oral)   Resp 18  SpO2 96%    Final Clinical Impressions(s) / ED Diagnoses   Final diagnoses:  Unstable angina [facility])    ED Discharge Orders    None     1:04 PM Patient here with chest pain shortness of breath.  History of angina, suspect unstable angina. HEART score of 6, moderate risk of MACE.  Given prior history of PE, will obtain chest CT angiogram.  She still endorse chest discomfort, will give aspirin and sublingual nitro.  Patient otherwise well-appearing.  4:07 PM Fortunately chest CT angiogram without any evidence of PE.  Patient does have mildly elevated troponin of 22 and 23 respectively.EKG with T wave inversion similar to prior.  Mildly elevated lipase of 58.  Chest x-ray shows evidence of pulmonary vascular congestion and mild cardiomegaly.  BNP is currently pending.  Appreciate consultation from cardiology Dr. Burt Knack who have reviewed patient's EKG and labs and felt that this is less likely to be cardiac pathology given previous heart catheterization without any acute finding.  Since patient received sublingual nitro and aspirin as well as GI cocktail with no adequate relief of her symptoms, will consult family practice center for admission for further work-up.  4:28 PM Pt sign out to oncoming provider who will consult medicine for  admission for chest pain rule out.    Domenic Moras, PA-C 03/05/19 1629    Tegeler, Gwenyth Allegra, MD 03/05/19 1734

## 2019-03-05 NOTE — ED Notes (Signed)
Pt ambulated self efficiently to restroom with no difficulty. Pt returned safely to bedside. Urine specimen @ bedside.

## 2019-03-06 ENCOUNTER — Observation Stay (HOSPITAL_BASED_OUTPATIENT_CLINIC_OR_DEPARTMENT_OTHER): Payer: Medicare Other

## 2019-03-06 DIAGNOSIS — E1169 Type 2 diabetes mellitus with other specified complication: Secondary | ICD-10-CM | POA: Diagnosis not present

## 2019-03-06 DIAGNOSIS — I5032 Chronic diastolic (congestive) heart failure: Secondary | ICD-10-CM | POA: Diagnosis not present

## 2019-03-06 DIAGNOSIS — R079 Chest pain, unspecified: Secondary | ICD-10-CM

## 2019-03-06 DIAGNOSIS — R0789 Other chest pain: Secondary | ICD-10-CM

## 2019-03-06 DIAGNOSIS — I10 Essential (primary) hypertension: Secondary | ICD-10-CM | POA: Diagnosis not present

## 2019-03-06 LAB — BASIC METABOLIC PANEL
Anion gap: 9 (ref 5–15)
BUN: 12 mg/dL (ref 6–20)
CO2: 27 mmol/L (ref 22–32)
Calcium: 8.7 mg/dL — ABNORMAL LOW (ref 8.9–10.3)
Chloride: 105 mmol/L (ref 98–111)
Creatinine, Ser: 0.95 mg/dL (ref 0.44–1.00)
GFR calc Af Amer: >60 mL/min (ref >60)
GFR calc non Af Amer: >60 mL/min (ref >60)
Glucose, Bld: 141 mg/dL — ABNORMAL HIGH (ref 70–99)
Potassium: 3.4 mmol/L — ABNORMAL LOW (ref 3.5–5.1)
Sodium: 141 mmol/L (ref 135–145)

## 2019-03-06 LAB — CBC
HCT: 35.5 % — ABNORMAL LOW (ref 36.0–46.0)
Hemoglobin: 11.5 g/dL — ABNORMAL LOW (ref 12.0–15.0)
MCH: 27.6 pg (ref 26.0–34.0)
MCHC: 32.4 g/dL (ref 30.0–36.0)
MCV: 85.3 fL (ref 80.0–100.0)
Platelets: 197 10*3/uL (ref 150–400)
RBC: 4.16 MIL/uL (ref 3.87–5.11)
RDW: 13.7 % (ref 11.5–15.5)
WBC: 5.4 10*3/uL (ref 4.0–10.5)
nRBC: 0 % (ref 0.0–0.2)

## 2019-03-06 LAB — GLUCOSE, CAPILLARY
Glucose-Capillary: 128 mg/dL — ABNORMAL HIGH (ref 70–99)
Glucose-Capillary: 126 mg/dL — ABNORMAL HIGH (ref 70–99)
Glucose-Capillary: 135 mg/dL — ABNORMAL HIGH (ref 70–99)

## 2019-03-06 LAB — ECHOCARDIOGRAM COMPLETE: Weight: 3467.2 oz

## 2019-03-06 MED ORDER — POTASSIUM CHLORIDE CRYS ER 20 MEQ PO TBCR
40.0000 meq | EXTENDED_RELEASE_TABLET | Freq: Once | ORAL | Status: AC
  Administered 2019-03-06: 40 meq via ORAL
  Filled 2019-03-06: qty 2

## 2019-03-06 NOTE — Progress Notes (Signed)
SATURATION QUALIFICATIONS: (This note is used to comply with regulatory documentation for home oxygen)  Patient Saturations on Room Air at Rest = 96%  Patient Saturations on Room Air while Ambulating = 90%  Brief shortness of breath during ambulation per patient but denies chest pain, dizziness.

## 2019-03-06 NOTE — Progress Notes (Signed)
Echocardiogram 2D Echocardiogram has been performed.  Oneal Deputy Hulon Ferron 03/06/2019, 11:31 AM

## 2019-03-06 NOTE — Discharge Summary (Signed)
Discharge Summary  [name] DOB: [birth year]  PCP: Sonia Side., FNP  Admit date: 03/04/2019 Discharge date: 03/06/2019  Time spent: 30 mins  Recommendations for Outpatient Follow-up:  1. PCP in 1 week with repeat labs and BP check   Discharge Diagnoses:  Active Hospital Problems   Diagnosis Date Noted  . Chest pain 09/30/2014  . HTN (hypertension) 09/28/2014  . Diabetes mellitus type 2 in obese ([place]) 10/29/2012  . Diastolic CHF ([place]) [id number]    Resolved Hospital Problems  No resolved problems to display.    Discharge Condition: Stable  Diet recommendation: Heart healthy  Vitals:   03/06/19 1126 03/06/19 1615  BP: 123/74 139/68  Pulse: 70 77  Resp: 18   Temp: 98.5 F (36.9 C) 98.1 F (36.7 C)  SpO2: 95% 97%    History of present illness:  [name] is a 58 y.o. female with medical history significant for diabetes mellitus type 2, hypertension, CHF, TIA, presents to the ED complaining about chest discomfort for the past 2 to 3 days.  Chest pain is described as a pressure sensation across her chest, reproducible, associated some shortness of breath, dizziness and diaphoresis, pain scale about 7-8 over 10, occurs both at rest as well as exertion. Patient reports generalized fatigue and reported significant stress in her life. Denies any abdominal pain, diarrhea, fever/chills, any sick contacts. In the ED, vital signs somewhat stable except for elevated blood pressure, labs showed troponin 22--> 23, with a flat trend, chest x-ray with minimal scarring or atelectasis in the lingula, CTA chest negative for PE, possible pulmonary arterial hypertension, unchanged right thyroid goiter.  Patient with a heart score of 6.  EDP discussed with cardiology, recommend for observation for chest pain.   Today, Pt denies any chest pain, worsening SOB, abdominal pain, N/V/D/C, fever/chills. Pt reported some much stressors in her life including taking  care of her ill, non-compliant mother, loss of her dog etc. Advised pt to rest, continue to be compliant to her meds and follow up with her PCP in 1 week.  Hospital Course:  Principal Problem:   Chest pain Active Problems:   Diastolic CHF ([facility])   Diabetes mellitus type 2 in obese ([facility])   HTN (hypertension)   Non cardiac chest pain Troponin very mildly elevated with a flat trend EKG with no acute ST changes Heart score of 6, recommend observation LDL 41 CTA chest, no PE noted Echo done 04/2016 showed EF of 55 to 123456, grade 2 diastolic dysfunction, repeat showed EF of 60-65% with grade 1DD No significant events on tele Follow up with PCP in 1 week  Chronic diastolic HF Appears compensated BNP 33.2 Chest x-ray with scarring noted, not in acute Repeat echo as above Continue Coreg CR, Lasix, isordil  Hypertension Stable Continue amlodipine, Coreg CR, lisinopril  Diabetes mellitus type 2 Last A1c 6.3 on 12/26/2018 Continue home regimen  Hypokalemia Continue K supplements   Hyperlipidemia Continue Lipitor  GERD Continue PPI BID  Depression Continue Zoloft, lexapro, buspar  Morbid obesity Lifestyle modification advised          Malnutrition Type:      Malnutrition Characteristics:      Nutrition Interventions:      Estimated body mass index is 45.29 kg/m as calculated from the following:   Height as of this encounter: 4\' 10"  (1.473 m).   Weight as of this encounter: 98.3 kg.    Procedures:  None  Consultations:  None  Discharge Exam: BP 139/68   Pulse 77   Temp 98.1 F (36.7 C) (Oral)   Resp 18   Ht 4\' 10"  (1.473 m)   Wt 98.3 kg   SpO2 97%   BMI 45.29 kg/m   General: NAD Cardiovascular: S1, S2 present Respiratory: CTAB   Discharge Instructions You were cared for by a hospitalist during your hospital stay. If you have any questions about your discharge medications or the care you received while you were in the hospital  after you are discharged, you can call the unit and asked to speak with the hospitalist on call if the hospitalist that took care of you is not available. Once you are discharged, your primary care physician will handle any further medical issues. Please note that NO REFILLS for any discharge medications will be authorized once you are discharged, as it is imperative that you return to your primary care physician (or establish a relationship with a primary care physician if you do not have one) for your aftercare needs so that they can reassess your need for medications and monitor your lab values.  Discharge Instructions    Diet - low sodium heart healthy   Complete by: As directed    Increase activity slowly   Complete by: As directed      Allergies as of 03/06/2019      Reactions   Penicillins Swelling, Other (See Comments)   Has patient had a PCN reaction causing immediate rash, facial/tongue/throat swelling, SOB or lightheadedness with hypotension: YES Has patient had a PCN reaction causing severe rash involving mucus membranes or skin necrosis: NO Has patient had a PCN reaction that required hospitalization NO Has patient had a PCN reaction occurring within the last 10 years: NO If all of the above answers are "NO", then may proceed with Cephalosporin use.   Hydrocodone-acetaminophen Itching   confusion   Latex Rash   Sulfa Antibiotics Diarrhea, Itching, Rash      Medication List    STOP taking these medications   acetaminophen 325 MG tablet Commonly known as: TYLENOL   lidocaine 5 % Commonly known as: Lidoderm   meloxicam 15 MG tablet Commonly known as: MOBIC   oxyCODONE-acetaminophen 5-325 MG tablet Commonly known as: PERCOCET/ROXICET     TAKE these medications   albuterol 108 (90 Base) MCG/ACT inhaler Commonly known as: VENTOLIN HFA Inhale 2 puffs into the lungs every 6 (six) hours as needed for wheezing or shortness of breath.   amLODipine 5 MG tablet Commonly  known as: NORVASC Take 5 mg by mouth daily.   aspirin 81 MG chewable tablet Chew 81 mg by mouth daily.   atorvastatin 40 MG tablet Commonly known as: LIPITOR Take 40 mg by mouth at bedtime.   busPIRone 15 MG tablet Commonly known as: BUSPAR Take 15 mg by mouth 2 (two) times daily.   carvedilol 10 MG 24 hr capsule Commonly known as: COREG CR Take 10 mg by mouth daily.   [employer] Feminine Moisturizer Gel Place 1 applicator vaginally every three (3) days as needed (vaginal dryness).   diclofenac sodium 1 % Gel Commonly known as: VOLTAREN Apply 2 g topically 4 (four) times daily.   escitalopram 20 MG tablet Commonly known as: LEXAPRO Take 20 mg by mouth daily.   furosemide 40 MG tablet Commonly known as: LASIX Take 40 mg by mouth daily.   hydrOXYzine 25 MG capsule Commonly known as: VISTARIL Take 25 mg by mouth 2 (two) times daily as needed for  itching.   isosorbide dinitrate 20 MG tablet Commonly known as: ISORDIL Take 20 mg by mouth 2 (two) times daily.   Janumet XR 50-500 MG Tb24 Generic drug: SitaGLIPtin-MetFORMIN HCl TAKE 1 TABLET BY MOUTH EVERY DAY   lisinopril 20 MG tablet Commonly known as: ZESTRIL Take 20 mg by mouth daily.   loperamide 2 MG tablet Commonly known as: IMODIUM A-D Take 4 mg by mouth 4 (four) times daily as needed for diarrhea or loose stools.   Myrbetriq 25 MG Tb24 tablet Generic drug: mirabegron ER TAKE 1 TABLET BY MOUTH EVERY DAY What changed: how much to take   omeprazole 20 MG capsule Commonly known as: PRILOSEC Take 20 mg by mouth 2 (two) times daily.   potassium chloride 10 MEQ tablet Commonly known as: Klor-Con M10 Take 1 tablet (10 mEq total) by mouth 2 (two) times daily.   sertraline 100 MG tablet Commonly known as: ZOLOFT Take 100 mg by mouth daily.   Vitamin D (Ergocalciferol) 1.25 MG (50000 UT) Caps capsule Commonly known as: DRISDOL Take 1 capsule (50,000 Units total) by mouth every 7 (seven) days. What changed:  additional instructions      Allergies  Allergen Reactions  . Penicillins Swelling and Other (See Comments)    Has patient had a PCN reaction causing immediate rash, facial/tongue/throat swelling, SOB or lightheadedness with hypotension: YES Has patient had a PCN reaction causing severe rash involving mucus membranes or skin necrosis: NO Has patient had a PCN reaction that required hospitalization NO Has patient had a PCN reaction occurring within the last 10 years: NO If all of the above answers are "NO", then may proceed with Cephalosporin use.  [name] Hydrocodone-Acetaminophen Itching    confusion  . Latex Rash  . Sulfa Antibiotics Diarrhea, Itching and Rash      The results of significant diagnostics from this hospitalization (including imaging, microbiology, ancillary and laboratory) are listed below for reference.    Significant Diagnostic Studies: Dg Chest 2 View  Result Date: 03/04/2019 CLINICAL DATA:  Chest pain EXAM: CHEST - 2 VIEW COMPARISON:  November 18, 2018 FINDINGS: There is mild cardiomegaly. There is mild prominence of the central pulmonary vasculature. No large airspace consolidation or pleural effusion. No acute osseous abnormality. IMPRESSION: Mild cardiomegaly and pulmonary vascular congestion. Electronically Signed   By: Prudencio Pair M.D.   On: 03/04/2019 21:08   Ct Angio Chest Pe W And/or Wo Contrast  Result Date: 03/05/2019 CLINICAL DATA:  Shortness of breath EXAM: CT ANGIOGRAPHY CHEST WITH CONTRAST TECHNIQUE: Multidetector CT imaging of the chest was performed using the standard protocol during bolus administration of intravenous contrast. Multiplanar CT image reconstructions and MIPs were obtained to evaluate the vascular anatomy. CONTRAST:  177mL OMNIPAQUE IOHEXOL 350 MG/ML SOLN COMPARISON:  09/02/2018 FINDINGS: Cardiovascular: Satisfactory opacification of the pulmonary arteries to the segmental level. No evidence of pulmonary embolism. Borderline enlargement of the  pulmonary trunk, similar to prior, suggesting underlying pulmonary arterial hypertension. Stable mild cardiomegaly. No pericardial effusion. Mediastinum/Nodes: Enlarged, heterogeneous right thyroid lobe with extension to the superior mediastinum, unchanged. Well-circumscribed 2.6 x 1.6 cm soft tissue mass in the prevascular space is unchanged from prior. No axillary or hilar lymphadenopathy. Trachea and esophagus within normal limits. Lungs/Pleura: Respiratory motion artifact degrades evaluation. Mild atelectasis within the lingula and right middle lobe. No focal airspace consolidation, pleural effusion, or pneumothorax. Upper Abdomen: No acute abnormality. Musculoskeletal: No chest wall abnormality. No acute or significant osseous findings. Review of the MIP images confirms the above  findings. IMPRESSION: 1. Negative for pulmonary embolism. 2. Mild atelectasis within the lingula and right middle lobe. Lungs otherwise clear. 3. Borderline enlargement of the pulmonary trunk suggesting underlying pulmonary arterial hypertension. 4. Stable benign 2.6 cm soft tissue mass in the prevascular space, which has been present on studies dating back to 2004. 5. Unchanged right thyroid goiter. 6. Stable cardiomegaly. Electronically Signed   By: Davina Poke M.D.   On: 03/05/2019 15:25   Dg Chest Port 1 View  Result Date: 03/05/2019 CLINICAL DATA:  Shortness of breath, chest pain, and abdominal pain. EXAM: PORTABLE CHEST 1 VIEW COMPARISON:  Chest x-ray dated 03/04/2019 and chest CT angiogram 03/05/2019 FINDINGS: Heart size is normal. The main pulmonary artery in the left pulmonary artery are chronically prominent. Minimal atelectasis or scarring in the lingula, unchanged since the prior CT scan. No infiltrates or effusions. No acute bone abnormality. IMPRESSION: 1. Minimal scarring or atelectasis in the lingula. No change since the prior CT scan. 2. Chronic prominence of the main and left pulmonary artery. Electronically  Signed   By: Lorriane Shire M.D.   On: 03/05/2019 17:46    Microbiology: Recent Results (from the past 240 hour(s))  SARS CORONAVIRUS 2 (TAT 6-24 HRS) Nasopharyngeal Nasopharyngeal Swab     Status: None   Collection Time: 03/05/19  1:08 PM   Specimen: Nasopharyngeal Swab  Result Value Ref Range Status   SARS Coronavirus 2 NEGATIVE NEGATIVE Final    Comment: (NOTE) SARS-CoV-2 target nucleic acids are NOT DETECTED. The SARS-CoV-2 RNA is generally detectable in upper and lower respiratory specimens during the acute phase of infection. Negative results do not preclude SARS-CoV-2 infection, do not rule out co-infections with other pathogens, and should not be used as the sole basis for treatment or other patient management decisions. Negative results must be combined with clinical observations, patient history, and epidemiological information. The expected result is Negative. Fact Sheet for Patients: SugarRoll.be Fact Sheet for Healthcare Providers: https://www.woods-mathews.com/ This test is not yet approved or cleared by the [place] FDA and  has been authorized for detection and/or diagnosis of SARS-CoV-2 by FDA under an Emergency Use Authorization (EUA). This EUA will remain  in effect (meaning this test can be used) for the duration of the COVID-19 declaration under Section 56 4(b)(1) of the Act, 21 U.S.C. section 360bbb-3(b)(1), unless the authorization is terminated or revoked sooner. Performed at [facility], [place] [address], [place], [place] [address]      Labs: Basic Metabolic Panel: Recent Labs  Lab 03/04/19 2041 03/05/19 1811 03/06/19 0437  NA 142 139 141  K 3.8 3.3* 3.4*  CL 106 103 105  CO2 28 24 27   GLUCOSE 93 103* 141*  BUN 14 11 12   CREATININE 0.95 0.70 0.95  CALCIUM 9.2 9.0 8.7*   Liver Function Tests: Recent Labs  Lab 03/04/19 2041 03/05/19 1811  AST 21 18  ALT 30 28  ALKPHOS 55 44  BILITOT  0.6 0.5  PROT 6.7 6.0*  ALBUMIN 3.6 3.3*   Recent Labs  Lab 03/04/19 2041 03/05/19 1811  LIPASE 58* 47   No results for input(s): AMMONIA in the last 168 hours. CBC: Recent Labs  Lab 03/04/19 2041 03/05/19 1811 03/06/19 0437  WBC 6.6 6.0 5.4  NEUTROABS  --  2.6  --   HGB 12.2 12.2 11.5*  HCT 38.5 38.4 35.5*  MCV 86.7 86.3 85.3  PLT 228 217 197   Cardiac Enzymes: No results for input(s): CKTOTAL, CKMB, CKMBINDEX,  TROPONINI in the last 168 hours. BNP: BNP (last 3 results) Recent Labs    09/02/18 0447 11/18/18 2043 03/05/19 1811  BNP 76.2 26.4 33.2    ProBNP (last 3 results) No results for input(s): PROBNP in the last 8760 hours.  CBG: Recent Labs  Lab 03/05/19 1959 03/06/19 0950 03/06/19 1126  GLUCAP 95 128* 126*       Signed:  Alma Friendly, MD [facility] 03/06/2019, 4:19 PM

## 2019-03-06 NOTE — Progress Notes (Signed)
Patient's K is 3.4.   Paged MD Also waiting to see if patient can have a diet order

## 2019-03-10 ENCOUNTER — Encounter (INDEPENDENT_AMBULATORY_CARE_PROVIDER_SITE_OTHER): Payer: Self-pay

## 2019-03-11 ENCOUNTER — Encounter: Payer: Self-pay | Admitting: Neurology

## 2019-03-12 ENCOUNTER — Ambulatory Visit (INDEPENDENT_AMBULATORY_CARE_PROVIDER_SITE_OTHER): Payer: Medicare Other | Admitting: Family Medicine

## 2019-03-13 ENCOUNTER — Ambulatory Visit (INDEPENDENT_AMBULATORY_CARE_PROVIDER_SITE_OTHER): Payer: Medicare Other | Admitting: Family Medicine

## 2019-03-13 ENCOUNTER — Telehealth: Payer: Self-pay | Admitting: *Deleted

## 2019-03-13 ENCOUNTER — Ambulatory Visit (INDEPENDENT_AMBULATORY_CARE_PROVIDER_SITE_OTHER): Payer: Medicare Other | Admitting: Adult Health

## 2019-03-13 ENCOUNTER — Encounter (INDEPENDENT_AMBULATORY_CARE_PROVIDER_SITE_OTHER): Payer: Self-pay | Admitting: Family Medicine

## 2019-03-13 ENCOUNTER — Other Ambulatory Visit: Payer: Self-pay

## 2019-03-13 ENCOUNTER — Encounter: Payer: Self-pay | Admitting: Adult Health

## 2019-03-13 VITALS — BP 93/60 | HR 81 | Temp 97.4°F | Ht 62.0 in | Wt 218.0 lb

## 2019-03-13 VITALS — BP 101/64 | HR 75 | Temp 97.6°F | Ht <= 58 in | Wt 212.0 lb

## 2019-03-13 DIAGNOSIS — Z9989 Dependence on other enabling machines and devices: Secondary | ICD-10-CM | POA: Diagnosis not present

## 2019-03-13 DIAGNOSIS — Z6841 Body Mass Index (BMI) 40.0 and over, adult: Secondary | ICD-10-CM | POA: Diagnosis not present

## 2019-03-13 DIAGNOSIS — G4733 Obstructive sleep apnea (adult) (pediatric): Secondary | ICD-10-CM | POA: Diagnosis not present

## 2019-03-13 DIAGNOSIS — E559 Vitamin D deficiency, unspecified: Secondary | ICD-10-CM

## 2019-03-13 DIAGNOSIS — N3941 Urge incontinence: Secondary | ICD-10-CM

## 2019-03-13 MED ORDER — VITAMIN D (ERGOCALCIFEROL) 1.25 MG (50000 UNIT) PO CAPS: 50000.0000 [IU] | ORAL_CAPSULE | ORAL | 0 refills | Status: DC

## 2019-03-13 MED ORDER — MYRBETRIQ 25 MG PO TB24: 25.0000 mg | ORAL_TABLET | Freq: Every day | ORAL | 0 refills | Status: DC

## 2019-03-13 NOTE — Progress Notes (Signed)
PATIENT: [name] DOB: 1960/07/24  REASON FOR VISIT: follow up HISTORY FROM: patient  HISTORY OF PRESENT ILLNESS: Today 03/13/19:  [name] is a 58 year old female with a history of obstructive sleep apnea on CPAP.  She returns today for follow-up.  Her download indicates that she use her machine 54 out of 90 days for compliance of 60%.  She use her machine greater than 4 hours 52 days for compliance of 58%.  On average she uses her machine 7 hours and 18 minutes.  Her residual AHI is 0.6 on 16 cm of water with EPR of 3.  Her leak in the 95th percentile is 30 L/min.  She states that she does feel the mask leaking at night.  She reports that she does change out her supplies regularly.  She returns today for follow-up.  HISTORY 03/07/18:  [name] is a 58 year old female with a history of obstructive sleep apnea on CPAP.  She returns today for follow-up.  Her CPAP download indicates that she use her machine 9 out of 90 days for compliance of 77%.  She used her machine greater than 4 hours 63 days for compliance of 70%.  On average she uses her machine 6 hours and 30 minutes.  Her residual AHI is 0.9 on 16 cmH2O with EPR of 3.  She states that the CPAP is working well for her.  She states that she does need to change out her supplies.  She reports that she has been going to [facility] healthy weight and wellness.  Reports that she has lost approximately 18 pounds.  She returns today for evaluation.   REVIEW OF SYSTEMS: Out of a complete 14 system review of symptoms, the patient complains only of the following symptoms, and all other reviewed systems are negative.  ALLERGIES: Allergies  Allergen Reactions  . Penicillins Swelling and Other (See Comments)    Has patient had a PCN reaction causing immediate rash, facial/tongue/throat swelling, SOB or lightheadedness with hypotension: YES Has patient had a PCN reaction causing severe rash involving mucus membranes or  skin necrosis: NO Has patient had a PCN reaction that required hospitalization NO Has patient had a PCN reaction occurring within the last 10 years: NO If all of the above answers are "NO", then may proceed with Cephalosporin use.  [name] Hydrocodone-Acetaminophen Itching    confusion  . Latex Rash  . Sulfa Antibiotics Diarrhea, Itching and Rash    HOME MEDICATIONS: Outpatient Medications Prior to Visit  Medication Sig Dispense Refill  . albuterol (PROVENTIL HFA;VENTOLIN HFA) 108 (90 Base) MCG/ACT inhaler Inhale 2 puffs into the lungs every 6 (six) hours as needed for wheezing or shortness of breath.    [name] amLODipine (NORVASC) 5 MG tablet Take 5 mg by mouth daily.  6  . aspirin 81 MG chewable tablet Chew 81 mg by mouth daily.    [name] atorvastatin (LIPITOR) 40 MG tablet Take 40 mg by mouth at bedtime.     . busPIRone (BUSPAR) 15 MG tablet Take 15 mg by mouth 2 (two) times daily.    . carvedilol (COREG CR) 10 MG 24 hr capsule Take 10 mg by mouth daily.    . diclofenac sodium (VOLTAREN) 1 % GEL Apply 2 g topically 4 (four) times daily.    [name] escitalopram (LEXAPRO) 20 MG tablet Take 20 mg by mouth daily.    . furosemide (LASIX) 40 MG tablet Take 40 mg by mouth daily.    . hydrOXYzine (VISTARIL)  25 MG capsule Take 25 mg by mouth 2 (two) times daily as needed for itching.    . isosorbide dinitrate (ISORDIL) 20 MG tablet Take 20 mg by mouth 2 (two) times daily.     [name] JANUMET XR 50-500 MG TB24 TAKE 1 TABLET BY MOUTH EVERY DAY (Patient taking differently: Take 1 tablet by mouth daily. ) 30 tablet 1  . lisinopril (PRINIVIL,ZESTRIL) 20 MG tablet Take 20 mg by mouth daily.     [name] loperamide (IMODIUM A-D) 2 MG tablet Take 4 mg by mouth 4 (four) times daily as needed for diarrhea or loose stools.     [name] MYRBETRIQ 25 MG TB24 tablet Take 1 tablet (25 mg total) by mouth daily. 30 tablet 0  . omeprazole (PRILOSEC) 20 MG capsule Take 20 mg by mouth 2 (two) times daily.    . potassium chloride (KLOR-CON M10) 10 MEQ  tablet Take 1 tablet (10 mEq total) by mouth 2 (two) times daily. 60 tablet 1  . sertraline (ZOLOFT) 100 MG tablet Take 100 mg by mouth daily.     . Vaginal Moisturizer ([employer] FEMININE MOISTURIZER) GEL Place 1 applicator vaginally every three (3) days as needed (vaginal dryness).     . Vitamin D, Ergocalciferol, (DRISDOL) 1.25 MG (50000 UT) CAPS capsule Take 1 capsule (50,000 Units total) by mouth every 7 (seven) days. 4 capsule 0   No facility-administered medications prior to visit.     PAST MEDICAL HISTORY: Past Medical History:  Diagnosis Date  . Anginal pain ([place])    a. NL cath in 2008;  b. Myoview 03/2011: dec uptake along mid anterior wall on stress imaging -> ? attenuation vs. ischemia, EF 65%;  c. Echo 04/2011: EF 55-60%, no RWMA, Gr 2 dd  . Anxiety   . Arthritis   . Asthma   . Back pain   . Bone cancer ([place])   . Cancer ([place])   . Chest pain   . CHF (congestive heart failure) ([place])   . CHF (congestive heart failure) ([place])   . Depression   . Diabetes mellitus   . Drug use   . Dyspnea    with exertion  . Dyspnea   . Frequent urination   . GERD (gastroesophageal reflux disease)   . Glaucoma   . HLD (hyperlipidemia)   . Hypertension   . IBS (irritable bowel syndrome)   . Joint pain   . Lactose intolerance   . Leg edema   . 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 Obesity   . Palpitations   . Pneumonia 05/2016   double  . Pulmonary edema   . Pulmonary embolism ([place])    a. 2008 -> coumadin x 6 mos.  . Rheumatoid arthritis ([place])   . Sleep apnea    on CPAP 02/2018  . TIA (transient ischemic attack)   . Urinary urgency     PAST SURGICAL HISTORY: Past Surgical History:  Procedure Laterality Date  . ABDOMINAL HYSTERECTOMY  2005  . APPENDECTOMY    . CARDIAC CATHETERIZATION     Normal  . CARDIAC CATHETERIZATION N/A 09/30/2014   Procedure: Left Heart Cath and Coronary Angiography;  Surgeon: Sherren Mocha, MD;  Location: [facility];  Service:  Cardiovascular;  Laterality: N/A;  . LAPAROSCOPIC APPENDECTOMY N/A 06/03/2012   Procedure: APPENDECTOMY LAPAROSCOPIC;  Surgeon: Stark Klein, MD;  Location: [facility];  Service: General;  Laterality: N/A;  . Left knee surgery  2008  .  LEG SURGERY    . TONSILLECTOMY    . TOTAL HIP ARTHROPLASTY Left 06/11/2018   Procedure: LEFT TOTAL HIP ARTHROPLASTY ANTERIOR APPROACH;  Surgeon: Meredith Pel, MD;  Location: [facility];  Service: Orthopedics;  Laterality: Left;  . TOTAL KNEE ARTHROPLASTY Left 08/22/2016   Procedure: TOTAL KNEE ARTHROPLASTY;  Surgeon: Meredith Pel, MD;  Location: [facility] [facility];  Service: Orthopedics;  Laterality: Left;  . TUBAL LIGATION  1989    FAMILY HISTORY: Family History  Problem Relation Age of Onset  . Emphysema Mother   . Arthritis Mother   . Heart failure Mother        alive @ 62  . Stroke Mother   . Diabetes Mother   . Hypertension Mother   . Hyperlipidemia Mother   . Depression Mother   . Anxiety disorder Mother   . Asthma Brother   . Heart disease Father        died @ 53's.  . Stroke Father   . Diabetes Father   . Hyperlipidemia Father   . Hypertension Father   . Bipolar disorder Father   . Sleep apnea Father   . Alcoholism Father   . Drug abuse Father   . Diabetes Sister   . Heart disease Paternal Grandfather   . Colon cancer Maternal Grandfather   . Breast cancer Maternal Aunt     SOCIAL HISTORY: Social History   Socioeconomic History  . Marital status: Divorced    Spouse name: Not on file  . Number of children: Not on file  . Years of education: Not on file  . Highest education level: Not on file  Occupational History  . Occupation: UNEMPLOYED    Employer: DISABLED  Social Needs  . Financial resource strain: Not on file  . Food insecurity    Worry: Not on file    Inability: Not on file  . Transportation needs    Medical: Not on file    Non-medical: Not on file  Tobacco Use  . Smoking status: Former Smoker    Packs/day: 0.50     Years: 15.00    Pack years: 7.50    Types: Cigarettes    Quit date: 04/24/1985    Years since quitting: 33.9  . Smokeless tobacco: Never Used  Substance and Sexual Activity  . Alcohol use: No    Alcohol/week: 0.0 standard drinks  . Drug use: Not Currently    Types: Marijuana  . Sexual activity: Never  Lifestyle  . Physical activity    Days per week: Not on file    Minutes per session: Not on file  . Stress: Not on file  Relationships  . Social Herbalist on phone: Not on file    Gets together: Not on file    Attends religious service: Not on file    Active member of club or organization: Not on file    Attends meetings of clubs or organizations: Not on file    Relationship status: Not on file  . Intimate partner violence    Fear of current or ex partner: Not on file    Emotionally abused: Not on file    Physically abused: Not on file    Forced sexual activity: Not on file  Other Topics Concern  . Not on file  Social History Narrative   Lives in [place] by herself.  Disabled.  Caffeine 1 cup avg daily.  3 kids, 6 grandkids.  PHYSICAL EXAM  Vitals:   03/13/19 1429  BP: 93/60  Pulse: 81  Temp: (!) 97.4 F (36.3 C)  Weight: 218 lb (98.9 kg)  Height: 5\' 2"  (1.575 m)   Body mass index is 39.87 kg/m.  Generalized: Well developed, in no acute distress  Chest: Lungs clear to auscultation bilaterally  Neurological examination  Mentation: Alert oriented to time, place, history taking. Follows all commands speech and language fluent Cranial nerve II-XII: Extraocular movements were full, visual field were full on confrontational test Head turning and shoulder shrug  were normal and symmetric. Motor: The motor testing reveals 5 over 5 strength of all 4 extremities. Good symmetric motor tone is noted throughout.  Sensory: Sensory testing is intact to soft touch on all 4 extremities. No evidence of extinction is noted.  Gait and station: Gait is normal.      DIAGNOSTIC DATA (LABS, IMAGING, TESTING) - I reviewed patient records, labs, notes, testing and imaging myself where available.  Lab Results  Component Value Date   WBC 5.4 03/06/2019   HGB 11.5 (L) 03/06/2019   HCT 35.5 (L) 03/06/2019   MCV 85.3 03/06/2019   PLT 197 03/06/2019      Component Value Date/Time   NA 141 03/06/2019 0437   NA 144 03/04/2018 1152   K 3.4 (L) 03/06/2019 0437   CL 105 03/06/2019 0437   CO2 27 03/06/2019 0437   GLUCOSE 141 (H) 03/06/2019 0437   BUN 12 03/06/2019 0437   BUN 17 03/04/2018 1152   CREATININE 0.95 03/06/2019 0437   CALCIUM 8.7 (L) 03/06/2019 0437   PROT 6.0 (L) 03/05/2019 1811   PROT 6.4 03/04/2018 1152   ALBUMIN 3.3 (L) 03/05/2019 1811   ALBUMIN 4.1 03/04/2018 1152   AST 18 03/05/2019 1811   ALT 28 03/05/2019 1811   ALKPHOS 44 03/05/2019 1811   BILITOT 0.5 03/05/2019 1811   BILITOT 0.4 03/04/2018 1152   GFRNONAA >60 03/06/2019 0437   GFRAA >60 03/06/2019 0437   Lab Results  Component Value Date   CHOL 119 12/26/2018   HDL 58 12/26/2018   LDLCALC 41 12/26/2018   TRIG 108 12/26/2018   CHOLHDL 4.2 04/23/2011   CHOLHDL 4.3 04/23/2011   Lab Results  Component Value Date   HGBA1C 6.3 (H) 12/26/2018   Lab Results  Component Value Date   VITAMINB12 432 05/25/2014   Lab Results  Component Value Date   TSH 0.458 03/05/2019      ASSESSMENT AND PLAN 58 y.o. year old female  has a past medical history of Anginal pain ([address]), Anxiety, Arthritis, Asthma, Back pain, Bone cancer ([place]), Cancer ([place]), Chest pain, CHF (congestive heart failure) ([place]), CHF (congestive heart failure) ([place]), Depression, Diabetes mellitus, Drug use, Dyspnea, Dyspnea, Frequent urination, GERD (gastroesophageal reflux disease), Glaucoma, HLD (hyperlipidemia), Hypertension, IBS (irritable bowel syndrome), Joint pain, Lactose intolerance, Leg edema, Mediastinal mass, Obesity, Palpitations, Pneumonia (05/2016), Pulmonary edema, Pulmonary embolism ([place]), Rheumatoid  arthritis ([place]), Sleep apnea, TIA (transient ischemic attack), and Urinary urgency. here with:  1. Obstructive sleep apnea on CPAP  Patient CPAP download shows suboptimal compliance and good treatment of her apnea.  I will send an order to her DME company for mask refitting.  She is encouraged to continue using CPAP nightly and greater than 4 hours each night.  She is advised that if her symptoms worsen or she develops new symptoms she should let us know.  She will follow-up in 1 year or sooner if needed    I  spent 15 minutes with the patient. 50% of this time was spent reviewing CPAP download   Ward Givens, MSN, NP-C 03/13/2019, 2:25 PM [facility] [address], [place], [place] [address] [phone]

## 2019-03-13 NOTE — Telephone Encounter (Signed)
Patient was no show for follow up with NP today.  

## 2019-03-13 NOTE — Patient Instructions (Signed)
Continue using CPAP nightly and greater than 4 hours each night.  °Mask refitting °If your symptoms worsen or you develop new symptoms please let us know.  ° °

## 2019-03-17 ENCOUNTER — Telehealth: Payer: Self-pay

## 2019-03-17 ENCOUNTER — Ambulatory Visit (INDEPENDENT_AMBULATORY_CARE_PROVIDER_SITE_OTHER): Payer: Medicare Other | Admitting: Physical Medicine and Rehabilitation

## 2019-03-17 ENCOUNTER — Ambulatory Visit: Payer: Self-pay

## 2019-03-17 ENCOUNTER — Encounter: Payer: Self-pay | Admitting: Physical Medicine and Rehabilitation

## 2019-03-17 ENCOUNTER — Other Ambulatory Visit: Payer: Self-pay

## 2019-03-17 DIAGNOSIS — M461 Sacroiliitis, not elsewhere classified: Secondary | ICD-10-CM | POA: Diagnosis not present

## 2019-03-17 NOTE — Progress Notes (Signed)
[name] - 58 y.o. female MRN [id number]  Date of birth: [birth year]  Office Visit Note: Visit Date: 03/17/2019 PCP: Sonia Side., FNP Referred by: No ref. provider found  Subjective: No chief complaint on file.  HPI: [name] is a 58 y.o. female who comes in today For planned left sacroiliac joint injection with fluoroscopic guidance.  Patient is followed by Dr. Anderson Malta from orthopedic standpoint and he was following her for her back and buttock pain which is on the left side.  She has a positive Fortin finger sign.  She has had prior epidural injection without much relief.  He had suggested facet joint blocks followed by sacroiliac joint injection which I agreed with unfortunately the patient had to cancel her first appointment and we already had this one set up and she does not have a driver today so we do need to do the sacroiliac joint injection first diagnostically.  She is set up to come in in a week or so for cervical epidural which she has had in the past and has done well with that.  She continues to have neck pain and arm pain.  Multiple joint arthritic type pain overall.  Unfortunately she may do better in a pain management program.  ROS Otherwise per HPI.  Assessment & Plan: Visit Diagnoses:  1. Sacroiliitis ([place])     Plan: No additional findings.   Meds & Orders: No orders of the defined types were placed in this encounter.   Orders Placed This Encounter  Procedures   Sacroiliac Joint Inj   C-ARM NO Order    Follow-up: No follow-ups on file.   Procedures: Sacroiliac Joint Inj (Left) on 03/17/2019 1:13 PM Indications: pain and diagnostic evaluation Details: 22 G 3.5 in needle, fluoroscopy-guided posterior approach Medications: 2 mL bupivacaine 0.5 %; 80 mg methylPREDNISolone acetate 80 MG/ML Outcome: tolerated well, no immediate complications  There was excellent flow of contrast producing a partial arthrogram of the sacroiliac  joint.  Procedure, treatment alternatives, risks and benefits explained, specific risks discussed. Consent was given by the patient. Immediately prior to procedure a time out was called to verify the correct patient, procedure, equipment, support staff and site/side marked as required. Patient was prepped and draped in the usual sterile fashion.      No notes on file   Clinical History: MRI LUMBAR SPINE WITHOUT CONTRAST  TECHNIQUE: Multiplanar, multisequence MR imaging of the lumbar spine was performed. No intravenous contrast was administered.  COMPARISON:  Plain films lumbar spine 04/27/2014.  FINDINGS: Segmentation:  Standard.  Alignment: Facet degenerative disease results in 0.3 cm anterolisthesis L4 on L5 and 0.6 cm anterolisthesis L5 on S1. Convex left scoliosis is noted.  Vertebrae:  No fracture or worrisome marrow lesion.  Conus medullaris and cauda equina: Conus extends to the L2 level. Conus and cauda equina appear normal.  Paraspinal and other soft tissues: Negative.  Disc levels:  The T9-10, T10-11 and T11-12 are imaged in the sagittal plane only. The central canal and foramina appear open at each level with mild disc bulging seen at T10-11 and T11-12.  T12-L1: Moderate facet arthropathy. Left paravertebral endplate spur. No central canal or foraminal stenosis.  L1-2: Moderate facet arthropathy.  Otherwise negative.  L2-3: Moderate facet arthropathy. Dorsal epidural fat is prominent. The central canal and foramina are open.  L3-4: Moderate facet arthropathy. Dorsal and left-side epidural fat is prominent. The central canal and right foramen are open. Mild left foraminal  narrowing noted.  L4-5: The disc is uncovered with a shallow bulge. There is advanced bilateral facet arthropathy and bulky ligamentum flavum thickening. Epidural fat is somewhat prominent. Moderate to moderately severe compression of the thecal sac is identified. Mild to  moderate foraminal narrowing is worse on the left.  L5-S1: Advanced bilateral facet degenerative disease. The disc is uncovered without bulging. The central canal and foramina are open.  IMPRESSION: Bulky ligamentum flavum thickening, shallow disc bulge and prominent epidural fat cause moderate to moderately severe compression of the thecal sac at L4-5. 0.3 cm facet mediated anterolisthesis is seen at this level.  Advanced facet arthropathy at L5-S1 results in 0.6 cm anterolisthesis. The central canal and foramina are open.   Electronically Signed   By: Inge Rise M.D.   On: 10/27/2017 13:35  Cervical spine MR 05/30/1014 There is reversal of the normal cervical lordosis with apex at C4-5. Trace anterolisthesis of C4 on C5 present. Overall, alignment is stable from previous MRI from 2014. Vertebral body heights are maintained. Signal intensity within the vertebral body bone marrow is normal. No focal osseous lesion  C4-5: Left eccentric circumferential degenerative disc osteophyte complex with left-sided uncovertebral spurring and mild left facet hypertrophy. There is resultant moderate to severe left foraminal narrowing. Mild effacement of the left ventral thecal sac present without significant canal stenosis. Mild right foraminal narrowing present as well.  C5-6: Left eccentric degenerative disc osteophyte with uncovertebral hypertrophy and facet arthrosis. There is fairly severe left sided foraminal narrowing. Mild to moderate right foraminal stenosis present as well. No central canal stenosis.  C6-7: Mild diffuse degenerative disc osteophyte complex, a centric to the right, with right-sided uncovertebral spurring and hypertrophy. There is moderate to severe right foraminal stenosis with mild left foraminal narrowing. No significant central canal stenosis.  C7-T1: Mild left-sided facet hypertrophy present. No significant canal or foraminal  stenosis.  Overall very similar findings to 2014   She reports that she quit smoking about 33 years ago. Her smoking use included cigarettes. She has a 7.50 pack-year smoking history. She has never used smokeless tobacco.  Recent Labs    05/22/18 1119 12/26/18 1135  HGBA1C 6.6* 6.3*    Objective:  VS:  HT:     WT:    BMI:      BP:    HR: bpm   TEMP: ( )   RESP:  Physical Exam  Ortho Exam Imaging: No results found.  Past Medical/Family/Surgical/Social History: Medications & Allergies reviewed per EMR, new medications updated. Patient Active Problem List   Diagnosis Date Noted   Unilateral primary osteoarthritis, left hip    Hip arthritis 06/11/2018   Class 3 severe obesity with serious comorbidity and body mass index (BMI) of 40.0 to 44.9 in adult [facility]) 05/29/2018   Other fatigue 11/20/2017   Shortness of breath on exertion 11/20/2017   Type 2 diabetes mellitus without complication, without long-term current use of insulin ([place]) 11/20/2017   Vitamin D deficiency 11/20/2017   Depression 11/20/2017   Other hyperlipidemia 11/20/2017   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/04/2016   Presence of left artificial knee joint 09/20/2016   Arthritis of knee 08/22/2016   Cervical radiculopathy 07/26/2016   Cervical disc disorder with radiculopathy 07/26/2016   Right arm pain 06/29/2016   Cervicalgia 06/29/2016   Primary osteoarthritis of left knee 06/29/2016   Hypersomnia with sleep apnea 11/18/2015   Lethargy 11/18/2015   Chest pain 09/30/2014   Abnormal cardiac function test 09/29/2014   Chest  pain with moderate risk for cardiac etiology 09/28/2014   HTN (hypertension) 09/28/2014   Hypokalemia    Paresthesia 06/18/2014   Numbness and tingling of left arm and leg    Unstable angina ([place]) 05/24/2014   OSA on CPAP 05/24/2014   Diabetes mellitus type 2 in obese ([place]) 10/29/2012   S/P laparoscopic appendectomy [id number]   Diastolic CHF ([place]) [id number]    Mediastinal mass 01/09/2012   SOB (shortness of breath) 06/20/2011   Mediastinal abnormality 06/20/2011   Dyslipidemia 12/23/2009   GLAUCOMA 12/23/2009   ARTHRITIS 12/23/2009   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 NL cath in 2008;  b. Myoview 03/2011: dec uptake along mid anterior wall on stress imaging -> ? attenuation vs. ischemia, EF 65%;  c. Echo 04/2011: EF 55-60%, no RWMA, Gr 2 dd   Anxiety    Arthritis    Asthma    Back pain    Bone cancer ([facility])    Cancer ([facility])    Chest pain    CHF (congestive heart failure) ([facility])    CHF (congestive heart failure) ([facility])    Depression    Diabetes mellitus    Drug use    Dyspnea    with exertion   Dyspnea    Frequent urination    GERD (gastroesophageal reflux disease)    Glaucoma    HLD (hyperlipidemia)    Hypertension    IBS (irritable bowel syndrome)    Joint pain    Lactose intolerance    Leg edema    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 2008 -> coumadin x 6 mos.   Rheumatoid arthritis ([place])    Sleep apnea    on CPAP 02/2018   TIA (transient ischemic attack)    Urinary urgency    Family History  Problem Relation Age of Onset   Emphysema Mother    Arthritis Mother    Heart failure Mother        alive @ 67   Stroke Mother    Diabetes Mother    Hypertension Mother    Hyperlipidemia Mother    Depression Mother    Anxiety disorder  Mother    Asthma Brother    Heart disease Father        died @ 34's.   Stroke Father    Diabetes Father    Hyperlipidemia Father    Hypertension Father    Bipolar disorder Father    Sleep apnea Father    Alcoholism Father    Drug abuse Father    Diabetes Sister    Heart disease Paternal Grandfather    Colon cancer Maternal Grandfather    Breast cancer Maternal Aunt    Past Surgical History:  Procedure Laterality Date   ABDOMINAL HYSTERECTOMY  2005   APPENDECTOMY     CARDIAC CATHETERIZATION     Normal   CARDIAC CATHETERIZATION N/A 09/30/2014  Procedure: Left Heart Cath and Coronary Angiography;  Surgeon: Sherren Mocha, MD;  Location: [facility];  Service: Cardiovascular;  Laterality: N/A;   LAPAROSCOPIC APPENDECTOMY N/A 06/03/2012   Procedure: APPENDECTOMY LAPAROSCOPIC;  Surgeon: Stark Klein, MD;  Location: [facility];  Service: General;  Laterality: N/A;   Left knee surgery  2008   LEG SURGERY     TONSILLECTOMY     TOTAL HIP ARTHROPLASTY Left 06/11/2018   Procedure: LEFT TOTAL HIP ARTHROPLASTY ANTERIOR APPROACH;  Surgeon: Meredith Pel, MD;  Location: [place];  Service: Orthopedics;  Laterality: Left;   TOTAL KNEE ARTHROPLASTY Left 08/22/2016   Procedure: TOTAL KNEE ARTHROPLASTY;  Surgeon: Meredith Pel, MD;  Location: [place];  Service: Orthopedics;  Laterality: Left;   TUBAL LIGATION  1989   Social History   Occupational History   Occupation: UNEMPLOYED    Employer: DISABLED  Tobacco Use   Smoking status: Former Smoker    Packs/day: 0.50    Years: 15.00    Pack years: 7.50    Types: Cigarettes    Quit date: 04/24/1985    Years since quitting: 33.9   Smokeless tobacco: Never Used  Substance and Sexual Activity   Alcohol use: No    Alcohol/week: 0.0 standard drinks   Drug use: Not Currently    Types: Marijuana   Sexual activity: Never

## 2019-03-17 NOTE — Progress Notes (Signed)
 .  Numeric Pain Rating Scale and Functional Assessment Average Pain 9   In the last MONTH (on 0-10 scale) has pain interfered with the following?  1. General activity like being  able to carry out your everyday physical activities such as walking, climbing stairs, carrying groceries, or moving a chair?  Rating(9)   -Dye Allergies.  

## 2019-03-17 NOTE — Telephone Encounter (Signed)
NP Ward Givens ordered a mask refitting on pt to be sent to [facility]).  Sent message on her behalf to [place] confirmed to process the refitting.

## 2019-03-18 MED ORDER — METHYLPREDNISOLONE ACETATE 80 MG/ML IJ SUSP
80.0000 mg | INTRAMUSCULAR | Status: AC | PRN
  Administered 2019-03-17: 13:00:00 80 mg via INTRA_ARTICULAR

## 2019-03-18 MED ORDER — BUPIVACAINE HCL 0.5 % IJ SOLN
2.0000 mL | INTRAMUSCULAR | Status: AC | PRN
  Administered 2019-03-17: 2 mL via INTRA_ARTICULAR

## 2019-03-18 NOTE — Progress Notes (Signed)
Office: [phone]  /  Fax: [phone]   HPI:   Chief Complaint: OBESITY [name] is here to discuss her progress with her obesity treatment plan. She is on the Category 2 plan and is following her eating plan approximately 70 % of the time. She states she is doing chair exercises for 10-15 minutes 3 times per week. [name] hasn't been concentrating on weight loss as much due to increased stress. She has had a lot of temptations and has questions about holiday eating strategies.  Her weight is 212 lb (96.2 kg) today and has gained 2 lbs since her last visit. She has lost 13 lbs since starting treatment with [name].  Vitamin D Deficiency [name] has a diagnosis of vitamin D deficiency. She is currently taking prescription Vit D and denies nausea, vomiting or muscle weakness.  Urge Incontinence [name] feels the Myrbetriq has helped decrease urge incontinence. She denies urinary retention. She requests a refill on her Myrbetriq  ASSESSMENT AND PLAN:  Vitamin D deficiency - Plan: Vitamin D, Ergocalciferol, (DRISDOL) 1.25 MG (50000 UT) CAPS capsule  Urge incontinence - Plan: MYRBETRIQ 25 MG TB24 tablet  Class 3 severe obesity with serious comorbidity and body mass index (BMI) of 40.0 to 44.9 in adult, unspecified obesity type ([place])  PLAN:  Vitamin D Deficiency [name] was informed that low vitamin D levels contributes to fatigue and are associated with obesity, breast, and colon cancer. [name] agrees to continue taking prescription Vit D 50,000 IU every week #4 and we will refill for 1 month. She will follow up for routine testing of vitamin D, at least 2-3 times per year. She was informed of the risk of over-replacement of vitamin D and agrees to not increase her dose unless she discusses this with [name] first. [name] agrees to follow up with our clinic in 3 weeks.  Urge Incontinence [name] agrees to continue taking Myrbetriq 25 mg PO daily #30 and we will refill for 1 month. [name] agrees to  follow up with our clinic in 3 weeks.  Obesity [name] is currently in the action stage of change. As such, her goal is to continue with weight loss efforts She has agreed to follow the Category 2 plan [name] has been instructed to work up to a goal of 150 minutes of combined cardio and strengthening exercise per week for weight loss and overall health benefits. We discussed the following Behavioral Modification Strategies today: increasing lean protein intake, decreasing simple carbohydrates  and holiday eating strategies    [name] has agreed to follow up with our clinic in 3 weeks with Tara Schwab, FNP-C. She was informed of the importance of frequent follow up visits to maximize her success with intensive lifestyle modifications for her multiple health conditions.  ALLERGIES: Allergies  Allergen Reactions  . Penicillins Swelling and Other (See Comments)    Has patient had a PCN reaction causing immediate rash, facial/tongue/throat swelling, SOB or lightheadedness with hypotension: YES Has patient had a PCN reaction causing severe rash involving mucus membranes or skin necrosis: NO Has patient had a PCN reaction that required hospitalization NO Has patient had a PCN reaction occurring within the last 10 years: NO If all of the above answers are "NO", then may proceed with Cephalosporin use.  [name] Hydrocodone-Acetaminophen Itching    confusion  . Latex Rash  . Sulfa Antibiotics Diarrhea, Itching and Rash    MEDICATIONS: Current Outpatient Medications on File Prior to Visit  Medication Sig Dispense Refill  . albuterol (PROVENTIL HFA;VENTOLIN  HFA) 108 (90 Base) MCG/ACT inhaler Inhale 2 puffs into the lungs every 6 (six) hours as needed for wheezing or shortness of breath.    [name] amLODipine (NORVASC) 5 MG tablet Take 5 mg by mouth daily.  6  . aspirin 81 MG chewable tablet Chew 81 mg by mouth daily.    [name] atorvastatin (LIPITOR) 40 MG tablet Take 40 mg by mouth at bedtime.     . busPIRone  (BUSPAR) 15 MG tablet Take 15 mg by mouth 2 (two) times daily.    . carvedilol (COREG CR) 10 MG 24 hr capsule Take 10 mg by mouth daily.    . diclofenac sodium (VOLTAREN) 1 % GEL Apply 2 g topically 4 (four) times daily.    [name] escitalopram (LEXAPRO) 20 MG tablet Take 20 mg by mouth daily.    . furosemide (LASIX) 40 MG tablet Take 40 mg by mouth daily.    . hydrOXYzine (VISTARIL) 25 MG capsule Take 25 mg by mouth 2 (two) times daily as needed for itching.    . isosorbide dinitrate (ISORDIL) 20 MG tablet Take 20 mg by mouth 2 (two) times daily.     [name] JANUMET XR 50-500 MG TB24 TAKE 1 TABLET BY MOUTH EVERY DAY (Patient taking differently: Take 1 tablet by mouth daily. ) 30 tablet 1  . lisinopril (PRINIVIL,ZESTRIL) 20 MG tablet Take 20 mg by mouth daily.     [name] loperamide (IMODIUM A-D) 2 MG tablet Take 4 mg by mouth 4 (four) times daily as needed for diarrhea or loose stools.     [name] omeprazole (PRILOSEC) 20 MG capsule Take 20 mg by mouth 2 (two) times daily.    . potassium chloride (KLOR-CON M10) 10 MEQ tablet Take 1 tablet (10 mEq total) by mouth 2 (two) times daily. 60 tablet 1  . sertraline (ZOLOFT) 100 MG tablet Take 100 mg by mouth daily.     . Vaginal Moisturizer ([employer] FEMININE MOISTURIZER) GEL Place 1 applicator vaginally every three (3) days as needed (vaginal dryness).     . [DISCONTINUED] KOMBIGLYZE XR 5-500 MG TB24 Take 1 tablet by mouth daily.      No current facility-administered medications on file prior to visit.     PAST MEDICAL HISTORY: Past Medical History:  Diagnosis Date  . Anginal pain ([place])    a. NL cath in 2008;  b. Myoview 03/2011: dec uptake along mid anterior wall on stress imaging -> ? attenuation vs. ischemia, EF 65%;  c. Echo 04/2011: EF 55-60%, no RWMA, Gr 2 dd  . Anxiety   . Arthritis   . Asthma   . Back pain   . Bone cancer ([place])   . Cancer ([place])   . Chest pain   . CHF (congestive heart failure) ([place])   . CHF (congestive heart failure) ([place])   . Depression   .  Diabetes mellitus   . Drug use   . Dyspnea    with exertion  . Dyspnea   . Frequent urination   . GERD (gastroesophageal reflux disease)   . Glaucoma   . HLD (hyperlipidemia)   . Hypertension   . IBS (irritable bowel syndrome)   . Joint pain   . Lactose intolerance   . Leg edema   . 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 Obesity   . Palpitations   . Pneumonia 05/2016   double  . Pulmonary edema   . Pulmonary embolism ([place])  a. 2008 -> coumadin x 6 mos.  . Rheumatoid arthritis ([place])   . Sleep apnea    on CPAP 02/2018  . TIA (transient ischemic attack)   . Urinary urgency     PAST SURGICAL HISTORY: Past Surgical History:  Procedure Laterality Date  . ABDOMINAL HYSTERECTOMY  2005  . APPENDECTOMY    . CARDIAC CATHETERIZATION     Normal  . CARDIAC CATHETERIZATION N/A 09/30/2014   Procedure: Left Heart Cath and Coronary Angiography;  Surgeon: Sherren Mocha, MD;  Location: [place] [facility];  Service: Cardiovascular;  Laterality: N/A;  . LAPAROSCOPIC APPENDECTOMY N/A 06/03/2012   Procedure: APPENDECTOMY LAPAROSCOPIC;  Surgeon: Stark Klein, MD;  Location: [place];  Service: General;  Laterality: N/A;  . Left knee surgery  2008  . LEG SURGERY    . TONSILLECTOMY    . TOTAL HIP ARTHROPLASTY Left 06/11/2018   Procedure: LEFT TOTAL HIP ARTHROPLASTY ANTERIOR APPROACH;  Surgeon: Meredith Pel, MD;  Location: [place];  Service: Orthopedics;  Laterality: Left;  . TOTAL KNEE ARTHROPLASTY Left 08/22/2016   Procedure: TOTAL KNEE ARTHROPLASTY;  Surgeon: Meredith Pel, MD;  Location: [place];  Service: Orthopedics;  Laterality: Left;  . TUBAL LIGATION  1989    SOCIAL HISTORY: Social History   Tobacco Use  . Smoking status: Former Smoker    Packs/day: 0.50    Years: 15.00    Pack years: 7.50    Types: Cigarettes    Quit date: 04/24/1985    Years since quitting: 33.9  . Smokeless tobacco: Never Used  Substance Use Topics  . Alcohol use: No    Alcohol/week:  0.0 standard drinks  . Drug use: Not Currently    Types: Marijuana    FAMILY HISTORY: Family History  Problem Relation Age of Onset  . Emphysema Mother   . Arthritis Mother   . Heart failure Mother        alive @ 55  . Stroke Mother   . Diabetes Mother   . Hypertension Mother   . Hyperlipidemia Mother   . Depression Mother   . Anxiety disorder Mother   . Asthma Brother   . Heart disease Father        died @ 69's.  . Stroke Father   . Diabetes Father   . Hyperlipidemia Father   . Hypertension Father   . Bipolar disorder Father   . Sleep apnea Father   . Alcoholism Father   . Drug abuse Father   . Diabetes Sister   . Heart disease Paternal Grandfather   . Colon cancer Maternal Grandfather   . Breast cancer Maternal Aunt     ROS: Review of Systems  Constitutional: Negative for weight loss.  Gastrointestinal: Negative for nausea and vomiting.  Musculoskeletal:       Negative muscle weakness    PHYSICAL EXAM: Blood pressure 101/64, pulse 75, temperature 97.6 F (36.4 C), temperature source Oral, height 4\' 10"  (1.473 m), weight 212 lb (96.2 kg), SpO2 97 %. Body mass index is 44.31 kg/m. Physical Exam Vitals signs reviewed.  Constitutional:      Appearance: Normal appearance. She is obese.  Cardiovascular:     Rate and Rhythm: Normal rate.     Pulses: Normal pulses.  Pulmonary:     Effort: Pulmonary effort is normal.     Breath sounds: Normal breath sounds.  Musculoskeletal: Normal range of motion.  Skin:    General: Skin is warm and dry.  Neurological:  Mental Status: She is alert and oriented to person, place, and time.  Psychiatric:        Mood and Affect: Mood normal.        Behavior: Behavior normal.     RECENT LABS AND TESTS: BMET    Component Value Date/Time   NA 141 03/06/2019 0437   NA 144 03/04/2018 1152   K 3.4 (L) 03/06/2019 0437   CL 105 03/06/2019 0437   CO2 27 03/06/2019 0437   GLUCOSE 141 (H) 03/06/2019 0437   BUN 12  03/06/2019 0437   BUN 17 03/04/2018 1152   CREATININE 0.95 03/06/2019 0437   CALCIUM 8.7 (L) 03/06/2019 0437   GFRNONAA >60 03/06/2019 0437   GFRAA >60 03/06/2019 0437   Lab Results  Component Value Date   HGBA1C 6.3 (H) 12/26/2018   HGBA1C 6.6 (H) 05/22/2018   HGBA1C 6.8 (H) 03/04/2018   HGBA1C 8.8 (H) 11/20/2017   HGBA1C 7.4 (H) 08/11/2016   Lab Results  Component Value Date   INSULIN 28.1 (H) 12/26/2018   INSULIN 20.2 03/04/2018   INSULIN 53.9 (H) 11/20/2017   CBC    Component Value Date/Time   WBC 5.4 03/06/2019 0437   RBC 4.16 03/06/2019 0437   HGB 11.5 (L) 03/06/2019 0437   HGB 12.8 11/20/2017 0925   HCT 35.5 (L) 03/06/2019 0437   HCT 40.6 11/20/2017 0925   PLT 197 03/06/2019 0437   MCV 85.3 03/06/2019 0437   MCV 85 11/20/2017 0925   MCH 27.6 03/06/2019 0437   MCHC 32.4 03/06/2019 0437   RDW 13.7 03/06/2019 0437   RDW 13.9 11/20/2017 0925   LYMPHSABS 2.7 03/05/2019 1811   LYMPHSABS 2.1 11/20/2017 0925   MONOABS 0.4 03/05/2019 1811   EOSABS 0.3 03/05/2019 1811   EOSABS 0.4 11/20/2017 0925   BASOSABS 0.0 03/05/2019 1811   BASOSABS 0.0 11/20/2017 0925   Iron/TIBC/Ferritin/ %Sat No results found for: IRON, TIBC, FERRITIN, IRONPCTSAT Lipid Panel     Component Value Date/Time   CHOL 119 12/26/2018 1135   TRIG 108 12/26/2018 1135   HDL 58 12/26/2018 1135   CHOLHDL 4.2 04/23/2011 0830   CHOLHDL 4.3 04/23/2011 0830   VLDL 29 04/23/2011 0830   VLDL 29 04/23/2011 0830   LDLCALC 41 12/26/2018 1135   Hepatic Function Panel     Component Value Date/Time   PROT 6.0 (L) 03/05/2019 1811   PROT 6.4 03/04/2018 1152   ALBUMIN 3.3 (L) 03/05/2019 1811   ALBUMIN 4.1 03/04/2018 1152   AST 18 03/05/2019 1811   ALT 28 03/05/2019 1811   ALKPHOS 44 03/05/2019 1811   BILITOT 0.5 03/05/2019 1811   BILITOT 0.4 03/04/2018 1152   BILIDIR 0.1 05/22/2016 0913   IBILI 0.6 05/22/2016 0913      Component Value Date/Time   TSH 0.458 03/05/2019 1811   TSH 0.638 11/20/2017  0925   TSH 0.112 (L) 05/24/2016 0629   TSH 0.345 (L) 06/18/2014 0930   TSH 0.167 (L) 10/28/2012 0144      OBESITY BEHAVIORAL INTERVENTION VISIT  Today's visit was # 21   Starting weight: 225 lbs Starting date: 11/20/17 Today's weight : 212 lbs Today's date: 03/13/2019 Total lbs lost to date: 13 At least 15 minutes were spent on discussing the following behavioral intervention visit.   ASK: We discussed the diagnosis of obesity with Edwyna Ready today and Vaneshia agreed to give [name] permission to discuss obesity behavioral modification therapy today.  ASSESS: Radhika has the diagnosis of obesity and  her BMI today is 44.32 Shantrel is in the action stage of change   ADVISE: Pragathi was educated on the multiple health risks of obesity as well as the benefit of weight loss to improve her health. She was advised of the need for long term treatment and the importance of lifestyle modifications to improve her current health and to decrease her risk of future health problems.  AGREE: Multiple dietary modification options and treatment options were discussed and  Tasharra agreed to follow the recommendations documented in the above note.  ARRANGE: Brookes was educated on the importance of frequent visits to treat obesity as outlined per CMS and USPSTF guidelines and agreed to schedule her next follow up appointment today.  I, Trixie Dredge, am acting as transcriptionist for Dennard Nip, MD I have reviewed the above documentation for accuracy and completeness, and I agree with the above. -Dennard Nip, MD

## 2019-03-24 ENCOUNTER — Encounter: Payer: Medicare Other | Admitting: Physical Medicine and Rehabilitation

## 2019-03-31 ENCOUNTER — Ambulatory Visit: Payer: Self-pay

## 2019-03-31 ENCOUNTER — Encounter: Payer: Self-pay | Admitting: Physical Medicine and Rehabilitation

## 2019-03-31 ENCOUNTER — Encounter: Payer: Medicare Other | Admitting: Physical Medicine and Rehabilitation

## 2019-03-31 ENCOUNTER — Other Ambulatory Visit: Payer: Self-pay

## 2019-03-31 ENCOUNTER — Ambulatory Visit (INDEPENDENT_AMBULATORY_CARE_PROVIDER_SITE_OTHER): Payer: Medicare Other | Admitting: Physical Medicine and Rehabilitation

## 2019-03-31 VITALS — BP 123/73 | HR 73

## 2019-03-31 DIAGNOSIS — M47816 Spondylosis without myelopathy or radiculopathy, lumbar region: Secondary | ICD-10-CM

## 2019-03-31 MED ORDER — METHYLPREDNISOLONE ACETATE 80 MG/ML IJ SUSP
80.0000 mg | Freq: Once | INTRAMUSCULAR | Status: AC
  Administered 2019-03-31: 80 mg

## 2019-03-31 NOTE — Progress Notes (Signed)
 .  Numeric Pain Rating Scale and Functional Assessment Average Pain 8   In the last MONTH (on 0-10 scale) has pain interfered with the following?  1. General activity like being  able to carry out your everyday physical activities such as walking, climbing stairs, carrying groceries, or moving a chair?  Rating(8)   +Driver, -BT, -Dye Allergies.  

## 2019-04-01 NOTE — Progress Notes (Signed)
[name] - 58 y.o. female MRN [id number]  Date of birth: Apr 02, 1961  Office Visit Note: Visit Date: 03/31/2019 PCP: Tara Side., FNP Referred by: Tara Pel, MD  Subjective: Chief Complaint  Patient presents with  . Lower Back - Pain   HPI:  [name] is a 57 y.o. female who comes in today At the request of Dr. Anderson Barrera for lower lumbar facet joint blocks.  She has been having left more than right lower back pain radiating to the hip and buttock but not down the leg.  MRIs mainly showing facet arthropathy.  She is done well in the past with intermittent injection.  She has really a multitude of orthopedic complaints.  We saw her recently for sacroiliac joint injection which she says helped but then she still having quite a bit of pain and rates her pain as an 8 out of 10.  She has some limited understanding really of injections and why were doing those and I took a lot of time explaining this today.  I do think given her findings of pain on the left side worse with standing and moving that it could be facet arthritis she does have considerable facet arthropathy.  We will try that injection today.  We seen in the past for neck and shoulder issues which are actually calmed down quite a bit at this point.  We have suggested to her that if she is feeling better with that regard she does not need an injection for that and she can follow-up with Dr. Marlou Barrera or call us if that were to flareup.  ROS Otherwise per HPI.  Assessment & Plan: Visit Diagnoses:  1. Spondylosis without myelopathy or radiculopathy, lumbar region     Plan: No additional findings.   Meds & Orders:  Meds ordered this encounter  Medications  . methylPREDNISolone acetate (DEPO-MEDROL) injection 80 mg    Orders Placed This Encounter  Procedures  . Facet Injection  . XR C-ARM NO REPORT    Follow-up: Return if symptoms worsen or fail to improve.   Procedures: No procedures performed   Lumbar Facet Joint Intra-Articular Injection(s) with Fluoroscopic Guidance  Patient: [name]      Date of Birth: 08-Apr-1961 MRN: [id number] PCP: Tara Side., FNP      Visit Date: 03/31/2019   Universal Protocol:    Date/Time: 03/31/2019  Consent Given By: the patient  Position: PRONE   Additional Comments: Vital signs were monitored before and after the procedure. Patient was prepped and draped in the usual sterile fashion. The correct patient, procedure, and site was verified.   Injection Procedure Details:  Procedure Site One Meds Administered:  Meds ordered this encounter  Medications  . methylPREDNISolone acetate (DEPO-MEDROL) injection 80 mg     Laterality: Left  Location/Site:  L4-L5 L5-S1  Needle size: 22 guage  Needle type: Spinal  Needle Placement: Articular  Findings:  -Comments: Excellent flow of contrast producing a partial arthrogram.  Procedure Details: The fluoroscope beam is vertically oriented in AP, and the inferior recess is visualized beneath the lower pole of the inferior apophyseal process, which represents the target point for needle insertion. When direct visualization is difficult the target point is located at the medial projection of the vertebral pedicle. The region overlying each aforementioned target is locally anesthetized with a 1 to 2 ml. volume of 1% Lidocaine without Epinephrine.   The spinal needle was inserted into each of  the above mentioned facet joints using biplanar fluoroscopic guidance. A 0.25 to 0.5 ml. volume of Isovue-250 was injected and a partial facet joint arthrogram was obtained. A single spot film was obtained of the resulting arthrogram.    One to 1.25 ml of the steroid/anesthetic solution was then injected into each of the facet joints noted above.   Additional Comments:  The patient tolerated the procedure well Dressing: 2 x 2 sterile gauze and Band-Aid    Post-procedure details: Patient  was observed during the procedure. Post-procedure instructions were reviewed.  Patient left the clinic in stable condition.    Clinical History: MRI LUMBAR SPINE WITHOUT CONTRAST  TECHNIQUE: Multiplanar, multisequence MR imaging of the lumbar spine was performed. No intravenous contrast was administered.  COMPARISON:  Plain films lumbar spine 04/27/2014.  FINDINGS: Segmentation:  Standard.  Alignment: Facet degenerative disease results in 0.3 cm anterolisthesis L4 on L5 and 0.6 cm anterolisthesis L5 on S1. Convex left scoliosis is noted.  Vertebrae:  No fracture or worrisome marrow lesion.  Conus medullaris and cauda equina: Conus extends to the L2 level. Conus and cauda equina appear normal.  Paraspinal and other soft tissues: Negative.  Disc levels:  The T9-10, T10-11 and T11-12 are imaged in the sagittal plane only. The central canal and foramina appear open at each level with mild disc bulging seen at T10-11 and T11-12.  T12-L1: Moderate facet arthropathy. Left paravertebral endplate spur. No central canal or foraminal stenosis.  L1-2: Moderate facet arthropathy.  Otherwise negative.  L2-3: Moderate facet arthropathy. Dorsal epidural fat is prominent. The central canal and foramina are open.  L3-4: Moderate facet arthropathy. Dorsal and left-side epidural fat is prominent. The central canal and right foramen are open. Mild left foraminal narrowing noted.  L4-5: The disc is uncovered with a shallow bulge. There is advanced bilateral facet arthropathy and bulky ligamentum flavum thickening. Epidural fat is somewhat prominent. Moderate to moderately severe compression of the thecal sac is identified. Mild to moderate foraminal narrowing is worse on the left.  L5-S1: Advanced bilateral facet degenerative disease. The disc is uncovered without bulging. The central canal and foramina are open.  IMPRESSION: Bulky ligamentum flavum thickening,  shallow disc bulge and prominent epidural fat cause moderate to moderately severe compression of the thecal sac at L4-5. 0.3 cm facet mediated anterolisthesis is seen at this level.  Advanced facet arthropathy at L5-S1 results in 0.6 cm anterolisthesis. The central canal and foramina are open.   Electronically Signed   By: Inge Rise M.D.   On: 10/27/2017 13:35  Cervical spine MR 05/30/1014 There is reversal of the normal cervical lordosis with apex at C4-5. Trace anterolisthesis of C4 on C5 present. Overall, alignment is stable from previous MRI from 2014. Vertebral body heights are maintained. Signal intensity within the vertebral body bone marrow is normal. No focal osseous lesion  C4-5: Left eccentric circumferential degenerative disc osteophyte complex with left-sided uncovertebral spurring and mild left facet hypertrophy. There is resultant moderate to severe left foraminal narrowing. Mild effacement of the left ventral thecal sac present without significant canal stenosis. Mild right foraminal narrowing present as well.  C5-6: Left eccentric degenerative disc osteophyte with uncovertebral hypertrophy and facet arthrosis. There is fairly severe left sided foraminal narrowing. Mild to moderate right foraminal stenosis present as well. No central canal stenosis.  C6-7: Mild diffuse degenerative disc osteophyte complex, a centric to the right, with right-sided uncovertebral spurring and hypertrophy. There is moderate to severe right foraminal stenosis with mild  left foraminal narrowing. No significant central canal stenosis.  C7-T1: Mild left-sided facet hypertrophy present. No significant canal or foraminal stenosis.  Overall very similar findings to 2014     Objective:  VS:  HT:    WT:   BMI:     BP:123/73  HR:73bpm  TEMP: ( )  RESP:  Physical Exam  Ortho Exam Imaging: Xr C-arm No Report  Result Date: 03/31/2019 Please see Notes tab for imaging  impression.

## 2019-04-01 NOTE — Progress Notes (Signed)
Cardiology Office Note   Date:  04/02/2019   ID:  Tara, Barrera Mar 04, 1961, MRN [id number]  PCP:  Tara Side., FNP  Cardiologist:   Tara Rouge, MD   No chief complaint on file.     History of Present Illness: [name] is a 58 y.o. female who presents for f/u of atypical chest pain, diastolic CHF, HTN, HLD , DM and morbid obesity. Had nonspecific elevation in troponin when seen in ER for leg pain. Distant history of PE Rx coumadin CT June 2018 no PE off anticoagulation. Atypical chest pains in past with multiple normal caths most recently 2016 Last TTE done 05/22/16 EF 0000000 grade 2 diastolic no valve disease unable to estimate PA pressures   Still seeing [facility] for obesity management   She had her left TKR with Dr [name] 3 years ago   Left THR 05/2018 Dr [name]   DC from hospital 03/06/19 for atypical chest pain CTA negative for PE Stress taking care of her mother and losing her Dog recently Troponin chronically elevated with no trend despite 8/10 pain. No acute ECG changes Echo 03/06/19 with normal EF and no RWMAls or pericardial effusion grade one diastolic dysfunction EF [id number]   I see her mom [name] lives in same apartment complex Since d/c she has been doing ok   Past Medical History:  Diagnosis Date  . Anginal pain ([place])    a. NL cath in 2008;  b. Myoview 03/2011: dec uptake along mid anterior wall on stress imaging -> ? attenuation vs. ischemia, EF 65%;  c. Echo 04/2011: EF 55-60%, no RWMA, Gr 2 dd  . Anxiety   . Arthritis   . Asthma   . Back pain   . Bone cancer ([place])   . Cancer ([place])   . Chest pain   . CHF (congestive heart failure) ([place])   . CHF (congestive heart failure) ([place])   . Depression   . Diabetes mellitus   . Drug use   . Dyspnea    with exertion  . Dyspnea   . Frequent urination   . GERD (gastroesophageal reflux disease)   . Glaucoma   . HLD (hyperlipidemia)   . Hypertension   . IBS  (irritable bowel syndrome)   . Joint pain   . Lactose intolerance   . Leg edema   . 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 Obesity   . Palpitations   . Pneumonia 05/2016   double  . Pulmonary edema   . Pulmonary embolism ([place])    a. 2008 -> coumadin x 6 mos.  . Rheumatoid arthritis ([place])   . Sleep apnea    on CPAP 02/2018  . TIA (transient ischemic attack)   . Urinary urgency     Past Surgical History:  Procedure Laterality Date  . ABDOMINAL HYSTERECTOMY  2005  . APPENDECTOMY    . CARDIAC CATHETERIZATION     Normal  . CARDIAC CATHETERIZATION N/A 09/30/2014   Procedure: Left Heart Cath and Coronary Angiography;  Surgeon: Tara Mocha, MD;  Location: [facility];  Service: Cardiovascular;  Laterality: N/A;  . LAPAROSCOPIC APPENDECTOMY N/A 06/03/2012   Procedure: APPENDECTOMY LAPAROSCOPIC;  Surgeon: Tara Klein, MD;  Location: [facility];  Service: General;  Laterality: N/A;  . Left knee surgery  2008  . LEG SURGERY    . TONSILLECTOMY    . TOTAL HIP ARTHROPLASTY  Left 06/11/2018   Procedure: LEFT TOTAL HIP ARTHROPLASTY ANTERIOR APPROACH;  Surgeon: Tara Pel, MD;  Location: [facility];  Service: Orthopedics;  Laterality: Left;  . TOTAL KNEE ARTHROPLASTY Left 08/22/2016   Procedure: TOTAL KNEE ARTHROPLASTY;  Surgeon: Tara Pel, MD;  Location: [facility];  Service: Orthopedics;  Laterality: Left;  . TUBAL LIGATION  1989     Current Outpatient Medications  Medication Sig Dispense Refill  . albuterol (PROVENTIL HFA;VENTOLIN HFA) 108 (90 Base) MCG/ACT inhaler Inhale 2 puffs into the lungs every 6 (six) hours as needed for wheezing or shortness of breath.    [name] amLODipine (NORVASC) 5 MG tablet Take 5 mg by mouth daily.  6  . aspirin 81 MG chewable tablet Chew 81 mg by mouth daily.    [name] atorvastatin (LIPITOR) 40 MG tablet Take 40 mg by mouth at bedtime.     . busPIRone (BUSPAR) 15 MG tablet Take 15 mg by mouth 2 (two) times daily.    . carvedilol (COREG  CR) 10 MG 24 hr capsule Take 10 mg by mouth daily.    . diclofenac sodium (VOLTAREN) 1 % GEL Apply 2 g topically 4 (four) times daily.    [name] escitalopram (LEXAPRO) 20 MG tablet Take 20 mg by mouth daily.    . furosemide (LASIX) 40 MG tablet Take 40 mg by mouth daily.    . hydrOXYzine (VISTARIL) 25 MG capsule Take 25 mg by mouth 2 (two) times daily as needed for itching.    . isosorbide dinitrate (ISORDIL) 20 MG tablet Take 20 mg by mouth 2 (two) times daily.     [name] JANUMET XR 50-500 MG TB24 TAKE 1 TABLET BY MOUTH EVERY DAY (Patient taking differently: Take 1 tablet by mouth daily. ) 30 tablet 1  . lisinopril (PRINIVIL,ZESTRIL) 20 MG tablet Take 20 mg by mouth daily.     [name] loperamide (IMODIUM A-D) 2 MG tablet Take 4 mg by mouth 4 (four) times daily as needed for diarrhea or loose stools.     [name] MYRBETRIQ 25 MG TB24 tablet Take 1 tablet (25 mg total) by mouth daily. 30 tablet 0  . omeprazole (PRILOSEC) 20 MG capsule Take 20 mg by mouth 2 (two) times daily.    . potassium chloride (KLOR-CON M10) 10 MEQ tablet Take 1 tablet (10 mEq total) by mouth 2 (two) times daily. 60 tablet 1  . sertraline (ZOLOFT) 100 MG tablet Take 100 mg by mouth daily.     . Vaginal Moisturizer ([employer] FEMININE MOISTURIZER) GEL Place 1 applicator vaginally every three (3) days as needed (vaginal dryness).     . Vitamin D, Ergocalciferol, (DRISDOL) 1.25 MG (50000 UT) CAPS capsule Take 1 capsule (50,000 Units total) by mouth every 7 (seven) days. 4 capsule 0   No current facility-administered medications for this visit.     Allergies:   Penicillins, Hydrocodone-acetaminophen, Latex, and Sulfa antibiotics    Social History:  The patient  reports that she quit smoking about 33 years ago. Her smoking use included cigarettes. She has a 7.50 pack-year smoking history. She has never used smokeless tobacco. She reports previous drug use. Drug: Marijuana. She reports that she does not drink alcohol.   Family History:  The patient's  family history includes Alcoholism in her father; Anxiety disorder in her mother; Arthritis in her mother; Asthma in her brother; Bipolar disorder in her father; Breast cancer in her maternal aunt; Colon cancer in her maternal grandfather; Depression in her mother; Diabetes in  her father, mother, and sister; Drug abuse in her father; Emphysema in her mother; Heart disease in her father and paternal grandfather; Heart failure in her mother; Hyperlipidemia in her father and mother; Hypertension in her father and mother; Sleep apnea in her father; Stroke in her father and mother.    ROS:  Please see the history of present illness.   Otherwise, review of systems are positive for none.   All other systems are reviewed and negative.    PHYSICAL EXAM: VS:  BP 120/68   Pulse 77   Ht 5\' 2"  (1.575 m)   Wt 218 lb (98.9 kg)   SpO2 98%   BMI 39.87 kg/m  , BMI Body mass index is 39.87 kg/m. Affect appropriate Obese black female  HEENT: normal Neck supple with no adenopathy JVP normal no bruits no thyromegaly Lungs clear with no wheezing and good diaphragmatic motion Heart:  S1/S2 no murmur, no rub, gallop or click PMI normal Abdomen: benighn, BS positve, no tenderness, no AAA no bruit.  No HSM or HJR Distal pulses intact with no bruits No edema Neuro non-focal Skin warm and dry LEFT TKR  LEFT THR     EKG:  03/05/19 SR rate 66 nonspecific ST changes    Recent Labs: 03/05/2019: ALT 28; B Natriuretic Peptide 33.2; TSH 0.458 03/06/2019: BUN 12; Creatinine, Ser 0.95; Hemoglobin 11.5; Platelets 197; Potassium 3.4; Sodium 141    Lipid Panel    Component Value Date/Time   CHOL 119 12/26/2018 1135   TRIG 108 12/26/2018 1135   HDL 58 12/26/2018 1135   CHOLHDL 4.2 04/23/2011 0830   CHOLHDL 4.3 04/23/2011 0830   VLDL 29 04/23/2011 0830   VLDL 29 04/23/2011 0830   LDLCALC 41 12/26/2018 1135      Wt Readings from Last 3 Encounters:  04/02/19 218 lb (98.9 kg)  03/13/19 218 lb (98.9 kg)   03/13/19 212 lb (96.2 kg)      Other studies Reviewed: Additional studies/ records that were reviewed today include: Notes from primary , ortho weight loss center Cardiology notes cath x 2 TTE.    ASSESSMENT AND PLAN:  1.  Diastolic Dysfunction related to obesity, HTN and DM continue lasix stable 2.  HTN:  Well controlled.  Continue current medications and low sodium Dash type diet.   3. HLD:  Continue statin labs with primary  4. Ortho  Post left total hip replacement 05/2018  5. DM:  Discussed low carb diet.  Target hemoglobin A1c is 6.5 or less.  Continue current medications. 6. Chest Pain : atypical recent r/o in hospital Last normal cath done 09/30/14  F/u lexiscan myovue ordered given higher Risk of developing CAD with DM   Current medicines are reviewed at length with the patient today.  The patient does not have concerns regarding medicines.  The following changes have been made:  no change  Labs/ tests ordered today include: None   Orders Placed This Encounter  Procedures  . MYOCARDIAL PERFUSION IMAGING     Disposition:   FU with cardiology in a year if myovue normal     Signed, Tara Rouge, MD  04/02/2019 10:59 AM    [facility] [place], [place], [place]  [address] Phone: [phone]; Fax: [phone]

## 2019-04-01 NOTE — Procedures (Signed)
Lumbar Facet Joint Intra-Articular Injection(s) with Fluoroscopic Guidance  Patient: [name]      Date of Birth: April 27, 1960 MRN: [id number] PCP: Sonia Side., FNP      Visit Date: 03/31/2019   Universal Protocol:    Date/Time: 03/31/2019  Consent Given By: the patient  Position: PRONE   Additional Comments: Vital signs were monitored before and after the procedure. Patient was prepped and draped in the usual sterile fashion. The correct patient, procedure, and site was verified.   Injection Procedure Details:  Procedure Site One Meds Administered:  Meds ordered this encounter  Medications  . methylPREDNISolone acetate (DEPO-MEDROL) injection 80 mg     Laterality: Left  Location/Site:  L4-L5 L5-S1  Needle size: 22 guage  Needle type: Spinal  Needle Placement: Articular  Findings:  -Comments: Excellent flow of contrast producing a partial arthrogram.  Procedure Details: The fluoroscope beam is vertically oriented in AP, and the inferior recess is visualized beneath the lower pole of the inferior apophyseal process, which represents the target point for needle insertion. When direct visualization is difficult the target point is located at the medial projection of the vertebral pedicle. The region overlying each aforementioned target is locally anesthetized with a 1 to 2 ml. volume of 1% Lidocaine without Epinephrine.   The spinal needle was inserted into each of the above mentioned facet joints using biplanar fluoroscopic guidance. A 0.25 to 0.5 ml. volume of Isovue-250 was injected and a partial facet joint arthrogram was obtained. A single spot film was obtained of the resulting arthrogram.    One to 1.25 ml of the steroid/anesthetic solution was then injected into each of the facet joints noted above.   Additional Comments:  The patient tolerated the procedure well Dressing: 2 x 2 sterile gauze and Band-Aid    Post-procedure details: Patient was  observed during the procedure. Post-procedure instructions were reviewed.  Patient left the clinic in stable condition.

## 2019-04-02 ENCOUNTER — Other Ambulatory Visit: Payer: Self-pay

## 2019-04-02 ENCOUNTER — Encounter: Payer: Self-pay | Admitting: Cardiovascular Disease

## 2019-04-02 ENCOUNTER — Ambulatory Visit (INDEPENDENT_AMBULATORY_CARE_PROVIDER_SITE_OTHER): Payer: Medicare Other | Admitting: Cardiovascular Disease

## 2019-04-02 DIAGNOSIS — R079 Chest pain, unspecified: Secondary | ICD-10-CM

## 2019-04-02 NOTE — Patient Instructions (Signed)
Medication Instructions:   *If you need a refill on your cardiac medications before your next appointment, please call your pharmacy*  Lab Work:  If you have labs (blood work) drawn today and your tests are completely normal, you will receive your results only by: [name] MyChart Message (if you have MyChart) OR . A paper copy in the mail If you have any lab test that is abnormal or we need to change your treatment, we will call you to review the results.  Testing/Procedures: Your physician has requested that you have a lexiscan myoview. For further information please visit [email or web address]. Please follow instruction sheet, as given.  Follow-Up: At [facility], you and your health needs are our priority.  As part of our continuing mission to provide you with exceptional heart care, we have created designated Provider Care Teams.  These Care Teams include your primary Cardiologist (physician) and Advanced Practice Providers (APPs -  Physician Assistants and Nurse Practitioners) who all work together to provide you with the care you need, when you need it.  Your next appointment:   1 year(s)  The format for your next appointment:   In Person  Provider:   You may see [name], MD or one of the following Advanced Practice Providers on your designated Care Team:    Truitt Merle, NP  Cecilie Kicks, NP  Kathyrn Drown, NP

## 2019-04-03 ENCOUNTER — Encounter (INDEPENDENT_AMBULATORY_CARE_PROVIDER_SITE_OTHER): Payer: Self-pay | Admitting: Family Medicine

## 2019-04-03 ENCOUNTER — Ambulatory Visit (INDEPENDENT_AMBULATORY_CARE_PROVIDER_SITE_OTHER): Payer: Medicare Other | Admitting: Family Medicine

## 2019-04-03 VITALS — BP 135/71 | HR 83 | Temp 98.3°F | Ht 62.0 in | Wt 212.0 lb

## 2019-04-03 DIAGNOSIS — E559 Vitamin D deficiency, unspecified: Secondary | ICD-10-CM

## 2019-04-03 DIAGNOSIS — E119 Type 2 diabetes mellitus without complications: Secondary | ICD-10-CM

## 2019-04-03 DIAGNOSIS — Z6838 Body mass index (BMI) 38.0-38.9, adult: Secondary | ICD-10-CM

## 2019-04-03 MED ORDER — VITAMIN D (ERGOCALCIFEROL) 1.25 MG (50000 UNIT) PO CAPS: 50000.0000 [IU] | ORAL_CAPSULE | ORAL | 0 refills | Status: DC

## 2019-04-07 NOTE — Progress Notes (Signed)
Office: [phone]  /  Fax: [phone]   HPI:  Chief Complaint: OBESITY Chapel is here to discuss her progress with her obesity treatment plan. She is on the Category 2 plan and states she is following her eating plan approximately 80 % of the time. She states she is walking 15 minutes 3 to 4 times per week.  Chelcey was off track due to Thanksgiving. She is still skipping breakfast, but she does try to have a protein shake.  Vitamin D deficiency Cydne has a diagnosis of vitamin D deficiency. She is currently taking vitamin D. Her last vitamin D level was 41.8 on 12/26/18. She denies nausea, vomiting or muscle weakness.  Diabetes II Devondra has a diagnosis of diabetes type II and she is well controlled. Her last A1c was 6.3. Fasting BGs range between 87 and 128. Before bed it can read up to 170. She has rare hypoglycemia.    Today's visit was # 22 Starting weight: 225 lbs Starting date: 11/20/2017 Today's weight : 212 lbs  Today's date: 04/03/2019 Total lbs lost to date: 13 Total lbs lost since last in-office visit: 0  ASSESSMENT AND PLAN:  Vitamin D deficiency - Plan: Vitamin D, Ergocalciferol, (DRISDOL) 1.25 MG (50000 UT) CAPS capsule  Type 2 diabetes mellitus without complication, without long-term current use of insulin (HCC)  Class 2 severe obesity with serious comorbidity and body mass index (BMI) of 38.0 to 38.9 in adult, unspecified obesity type (HCC)  PLAN:  Vitamin D Deficiency Low vitamin D level contributes to fatigue and are associated with obesity, breast, and colon cancer. Caetlyn agrees to continue to take prescription Vit D @50 ,000 IU every week #4 with no refills and she will follow up for routine testing of vitamin D, at least 2-3 times per year to avoid over-replacement. Morine agrees to follow up with our clinic in 3 weeks.  Diabetes II Lilya has been given diabetes education by myself today. Good blood sugar control is important to decrease the  likelihood of diabetic complications such as nephropathy, neuropathy, limb loss, blindness, coronary artery disease, and death. Intensive lifestyle modification including diet, exercise and weight loss were discussed as the first line treatment for diabetes. Charlene will continue all medications and follow up at the agreed upon time.  Obesity Jaylianis is currently in the action stage of change. As such, her goal is to continue with weight loss efforts She has agreed to keep a food journal with 1200 to 1300 calories and 80 grams of protein daily or follow the Category 2 plan Ximenna will continue walking for 15 minutes, 3 to 4 times per week for weight loss and overall health benefits. We discussed the following Behavioral Modification Strategies today: planning for success, no skipping meals and work on meal planning and easy cooking plans Jameca will supplement with protein shake if she skips breakfast. I advised her, that I prefer she eat breakfast.  Kasinda has agreed to follow up with our clinic in 3 weeks. She was informed of the importance of frequent follow up visits to maximize her success with intensive lifestyle modifications for her multiple health conditions.  ALLERGIES: Allergies  Allergen Reactions  . Penicillins Swelling and Other (See Comments)    Has patient had a PCN reaction causing immediate rash, facial/tongue/throat swelling, SOB or lightheadedness with hypotension: YES Has patient had a PCN reaction causing severe rash involving mucus membranes or skin necrosis: NO Has patient had a PCN reaction that required hospitalization NO Has patient had  a PCN reaction occurring within the last 10 years: NO If all of the above answers are "NO", then may proceed with Cephalosporin use.  [name] Hydrocodone-Acetaminophen Itching    confusion  . Latex Rash  . Sulfa Antibiotics Diarrhea, Itching and Rash    MEDICATIONS: Current Outpatient Medications on File Prior to Visit  Medication Sig  Dispense Refill  . albuterol (PROVENTIL HFA;VENTOLIN HFA) 108 (90 Base) MCG/ACT inhaler Inhale 2 puffs into the lungs every 6 (six) hours as needed for wheezing or shortness of breath.    [name] amLODipine (NORVASC) 5 MG tablet Take 5 mg by mouth daily.  6  . aspirin 81 MG chewable tablet Chew 81 mg by mouth daily.    [name] atorvastatin (LIPITOR) 40 MG tablet Take 40 mg by mouth at bedtime.     . busPIRone (BUSPAR) 15 MG tablet Take 15 mg by mouth 2 (two) times daily.    . carvedilol (COREG CR) 10 MG 24 hr capsule Take 10 mg by mouth daily.    . diclofenac sodium (VOLTAREN) 1 % GEL Apply 2 g topically 4 (four) times daily.    [name] escitalopram (LEXAPRO) 20 MG tablet Take 20 mg by mouth daily.    . furosemide (LASIX) 40 MG tablet Take 40 mg by mouth daily.    . hydrOXYzine (VISTARIL) 25 MG capsule Take 25 mg by mouth 2 (two) times daily as needed for itching.    . isosorbide dinitrate (ISORDIL) 20 MG tablet Take 20 mg by mouth 2 (two) times daily.     [name] JANUMET XR 50-500 MG TB24 TAKE 1 TABLET BY MOUTH EVERY DAY (Patient taking differently: Take 1 tablet by mouth daily. ) 30 tablet 1  . lisinopril (PRINIVIL,ZESTRIL) 20 MG tablet Take 20 mg by mouth daily.     [name] loperamide (IMODIUM A-D) 2 MG tablet Take 4 mg by mouth 4 (four) times daily as needed for diarrhea or loose stools.     [name] MYRBETRIQ 25 MG TB24 tablet Take 1 tablet (25 mg total) by mouth daily. 30 tablet 0  . omeprazole (PRILOSEC) 20 MG capsule Take 20 mg by mouth 2 (two) times daily.    . potassium chloride (KLOR-CON M10) 10 MEQ tablet Take 1 tablet (10 mEq total) by mouth 2 (two) times daily. 60 tablet 1  . sertraline (ZOLOFT) 100 MG tablet Take 100 mg by mouth daily.     . Vaginal Moisturizer ([employer] FEMININE MOISTURIZER) GEL Place 1 applicator vaginally every three (3) days as needed (vaginal dryness).     . [DISCONTINUED] KOMBIGLYZE XR 5-500 MG TB24 Take 1 tablet by mouth daily.      No current facility-administered medications on file prior to  visit.    PAST MEDICAL HISTORY: Past Medical History:  Diagnosis Date  . Anginal pain ([place])    a. NL cath in 2008;  b. Myoview 03/2011: dec uptake along mid anterior wall on stress imaging -> ? attenuation vs. ischemia, EF 65%;  c. Echo 04/2011: EF 55-60%, no RWMA, Gr 2 dd  . Anxiety   . Arthritis   . Asthma   . Back pain   . Bone cancer ([place])   . Cancer ([place])   . Chest pain   . CHF (congestive heart failure) ([place])   . CHF (congestive heart failure) ([place])   . Depression   . Diabetes mellitus   . Drug use   . Dyspnea    with exertion  . Dyspnea   . Frequent urination   .  GERD (gastroesophageal reflux disease)   . Glaucoma   . HLD (hyperlipidemia)   . Hypertension   . IBS (irritable bowel syndrome)   . Joint pain   . Lactose intolerance   . Leg edema   . 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 Obesity   . Palpitations   . Pneumonia 05/2016   double  . Pulmonary edema   . Pulmonary embolism ([place])    a. 2008 -> coumadin x 6 mos.  . Rheumatoid arthritis ([place])   . Sleep apnea    on CPAP 02/2018  . TIA (transient ischemic attack)   . Urinary urgency     PAST SURGICAL HISTORY: Past Surgical History:  Procedure Laterality Date  . ABDOMINAL HYSTERECTOMY  2005  . APPENDECTOMY    . CARDIAC CATHETERIZATION     Normal  . CARDIAC CATHETERIZATION N/A 09/30/2014   Procedure: Left Heart Cath and Coronary Angiography;  Surgeon: Sherren Mocha, MD;  Location: [facility];  Service: Cardiovascular;  Laterality: N/A;  . LAPAROSCOPIC APPENDECTOMY N/A 06/03/2012   Procedure: APPENDECTOMY LAPAROSCOPIC;  Surgeon: Stark Klein, MD;  Location: [facility];  Service: General;  Laterality: N/A;  . Left knee surgery  2008  . LEG SURGERY    . TONSILLECTOMY    . TOTAL HIP ARTHROPLASTY Left 06/11/2018   Procedure: LEFT TOTAL HIP ARTHROPLASTY ANTERIOR APPROACH;  Surgeon: Meredith Pel, MD;  Location: [facility];  Service: Orthopedics;  Laterality: Left;  . TOTAL KNEE  ARTHROPLASTY Left 08/22/2016   Procedure: TOTAL KNEE ARTHROPLASTY;  Surgeon: Meredith Pel, MD;  Location: [facility];  Service: Orthopedics;  Laterality: Left;  . TUBAL LIGATION  1989    SOCIAL HISTORY: Social History   Tobacco Use  . Smoking status: Former Smoker    Packs/day: 0.50    Years: 15.00    Pack years: 7.50    Types: Cigarettes    Quit date: 04/24/1985    Years since quitting: 33.9  . Smokeless tobacco: Never Used  Substance Use Topics  . Alcohol use: No    Alcohol/week: 0.0 standard drinks  . Drug use: Not Currently    Types: Marijuana    FAMILY HISTORY: Family History  Problem Relation Age of Onset  . Emphysema Mother   . Arthritis Mother   . Heart failure Mother        alive @ 59  . Stroke Mother   . Diabetes Mother   . Hypertension Mother   . Hyperlipidemia Mother   . Depression Mother   . Anxiety disorder Mother   . Asthma Brother   . Heart disease Father        died @ 67's.  . Stroke Father   . Diabetes Father   . Hyperlipidemia Father   . Hypertension Father   . Bipolar disorder Father   . Sleep apnea Father   . Alcoholism Father   . Drug abuse Father   . Diabetes Sister   . Heart disease Paternal Grandfather   . Colon cancer Maternal Grandfather   . Breast cancer Maternal Aunt     ROS: Review of Systems  Constitutional: Negative for weight loss.  Gastrointestinal: Negative for nausea and vomiting.  Musculoskeletal:       Negative for muscle weakness  Endo/Heme/Allergies:       Positive for hypoglycemia    PHYSICAL EXAM: Blood pressure 135/71, pulse 83, temperature 98.3 F (36.8 C), temperature source Oral, height 5\' 2"  (  1.575 m), weight 212 lb (96.2 kg), SpO2 94 %. Body mass index is 38.78 kg/m. Physical Exam Vitals reviewed.  Constitutional:      General: She is not in acute distress.    Appearance: Normal appearance. She is well-developed. She is obese.  Cardiovascular:     Rate and Rhythm: Normal rate.  Pulmonary:      Effort: Pulmonary effort is normal.  Musculoskeletal:        General: Normal range of motion.  Skin:    General: Skin is warm and dry.  Neurological:     Mental Status: She is alert and oriented to person, place, and time.  Psychiatric:        Mood and Affect: Mood normal.        Behavior: Behavior normal.     RECENT LABS AND TESTS: BMET    Component Value Date/Time   NA 141 03/06/2019 0437   NA 144 03/04/2018 1152   K 3.4 (L) 03/06/2019 0437   CL 105 03/06/2019 0437   CO2 27 03/06/2019 0437   GLUCOSE 141 (H) 03/06/2019 0437   BUN 12 03/06/2019 0437   BUN 17 03/04/2018 1152   CREATININE 0.95 03/06/2019 0437   CALCIUM 8.7 (L) 03/06/2019 0437   GFRNONAA >60 03/06/2019 0437   GFRAA >60 03/06/2019 0437   Lab Results  Component Value Date   HGBA1C 6.3 (H) 12/26/2018   HGBA1C 6.6 (H) 05/22/2018   HGBA1C 6.8 (H) 03/04/2018   HGBA1C 8.8 (H) 11/20/2017   HGBA1C 7.4 (H) 08/11/2016   Lab Results  Component Value Date   INSULIN 28.1 (H) 12/26/2018   INSULIN 20.2 03/04/2018   INSULIN 53.9 (H) 11/20/2017   CBC    Component Value Date/Time   WBC 5.4 03/06/2019 0437   RBC 4.16 03/06/2019 0437   HGB 11.5 (L) 03/06/2019 0437   HGB 12.8 11/20/2017 0925   HCT 35.5 (L) 03/06/2019 0437   HCT 40.6 11/20/2017 0925   PLT 197 03/06/2019 0437   MCV 85.3 03/06/2019 0437   MCV 85 11/20/2017 0925   MCH 27.6 03/06/2019 0437   MCHC 32.4 03/06/2019 0437   RDW 13.7 03/06/2019 0437   RDW 13.9 11/20/2017 0925   LYMPHSABS 2.7 03/05/2019 1811   LYMPHSABS 2.1 11/20/2017 0925   MONOABS 0.4 03/05/2019 1811   EOSABS 0.3 03/05/2019 1811   EOSABS 0.4 11/20/2017 0925   BASOSABS 0.0 03/05/2019 1811   BASOSABS 0.0 11/20/2017 0925   Iron/TIBC/Ferritin/ %Sat No results found for: IRON, TIBC, FERRITIN, IRONPCTSAT Lipid Panel     Component Value Date/Time   CHOL 119 12/26/2018 1135   TRIG 108 12/26/2018 1135   HDL 58 12/26/2018 1135   CHOLHDL 4.2 04/23/2011 0830   CHOLHDL 4.3 04/23/2011  0830   VLDL 29 04/23/2011 0830   VLDL 29 04/23/2011 0830   LDLCALC 41 12/26/2018 1135   Hepatic Function Panel     Component Value Date/Time   PROT 6.0 (L) 03/05/2019 1811   PROT 6.4 03/04/2018 1152   ALBUMIN 3.3 (L) 03/05/2019 1811   ALBUMIN 4.1 03/04/2018 1152   AST 18 03/05/2019 1811   ALT 28 03/05/2019 1811   ALKPHOS 44 03/05/2019 1811   BILITOT 0.5 03/05/2019 1811   BILITOT 0.4 03/04/2018 1152   BILIDIR 0.1 05/22/2016 0913   IBILI 0.6 05/22/2016 0913      Component Value Date/Time   TSH 0.458 03/05/2019 1811   TSH 0.638 11/20/2017 0925   TSH 0.112 (L) 05/24/2016 0629   TSH  0.345 (L) 06/18/2014 0930   TSH 0.167 (L) 10/28/2012 0144     OBESITY BEHAVIORAL INTERVENTION VISIT DOCUMENTATION FOR INSURANCE (~15 minutes)    ASK: We discussed the diagnosis of obesity with Edwyna Ready today and Verlee agreed to give [name] permission to discuss obesity behavioral modification therapy today.  ASSESS: Macie has the diagnosis of obesity and her BMI today is 38.77 Rayma is in the action stage of change   ADVISE: Ginamarie was educated on the multiple health risks of obesity as well as the benefit of weight loss to improve her health. She was advised of the need for long term treatment and the importance of lifestyle modifications to improve her current health and to decrease her risk of future health problems.  AGREE: Multiple dietary modification options and treatment options were discussed and  Deriah agreed to follow the recommendations documented in the above note.  ARRANGE: Aydia was educated on the importance of frequent visits to treat obesity as outlined per CMS and USPSTF guidelines and agreed to schedule her next follow up appointment today.   I, Doreene Nest, am acting as transcriptionist for [employer], FNP-C  I have reviewed the above documentation for accuracy and completeness, and I agree with the above.  - Meia Emley, FNP-C.

## 2019-04-08 ENCOUNTER — Encounter (INDEPENDENT_AMBULATORY_CARE_PROVIDER_SITE_OTHER): Payer: Self-pay | Admitting: Family Medicine

## 2019-04-08 ENCOUNTER — Telehealth (HOSPITAL_COMMUNITY): Payer: Self-pay | Admitting: *Deleted

## 2019-04-08 NOTE — Telephone Encounter (Signed)
Patient given detailed instructions per Myocardial Perfusion Study Information Sheet for the test on 04/11/2019 at 10:30. Patient notified to arrive 15 minutes early and that it is imperative to arrive on time for appointment to keep from having the test rescheduled.  If you need to cancel or reschedule your appointment, please call the office within 24 hours of your appointment. . Patient verbalized understanding.[name]

## 2019-04-11 ENCOUNTER — Ambulatory Visit (HOSPITAL_COMMUNITY): Payer: Medicare Other | Attending: Cardiology

## 2019-04-11 ENCOUNTER — Other Ambulatory Visit: Payer: Self-pay

## 2019-04-11 VITALS — Ht 62.0 in | Wt 212.0 lb

## 2019-04-11 DIAGNOSIS — R079 Chest pain, unspecified: Secondary | ICD-10-CM | POA: Insufficient documentation

## 2019-04-11 DIAGNOSIS — R110 Nausea: Secondary | ICD-10-CM | POA: Diagnosis present

## 2019-04-11 LAB — MYOCARDIAL PERFUSION IMAGING
LV dias vol: 72 mL (ref 46–106)
LV sys vol: 21 mL
Peak HR: 96 {beats}/min
Rest HR: 68 {beats}/min
SRS: 0
SSS: 2
TID: 0.96

## 2019-04-11 MED ORDER — TECHNETIUM TC 99M TETROFOSMIN IV KIT
32.5000 | PACK | Freq: Once | INTRAVENOUS | Status: AC | PRN
  Administered 2019-04-11: 32.5 via INTRAVENOUS
  Filled 2019-04-11: qty 33

## 2019-04-11 MED ORDER — REGADENOSON 0.4 MG/5ML IV SOLN
0.4000 mg | Freq: Once | INTRAVENOUS | Status: AC
  Administered 2019-04-11: 0.4 mg via INTRAVENOUS

## 2019-04-11 MED ORDER — AMINOPHYLLINE 25 MG/ML IV SOLN
150.0000 mg | Freq: Once | INTRAVENOUS | Status: AC
  Administered 2019-04-11: 150 mg via INTRAVENOUS

## 2019-04-11 MED ORDER — TECHNETIUM TC 99M TETROFOSMIN IV KIT
10.4000 | PACK | Freq: Once | INTRAVENOUS | Status: AC | PRN
  Administered 2019-04-11: 10.4 via INTRAVENOUS
  Filled 2019-04-11: qty 11

## 2019-04-14 ENCOUNTER — Encounter: Payer: Medicare Other | Admitting: Physical Medicine and Rehabilitation

## 2019-04-15 IMAGING — CT CT HEAD W/O CM
3 series · 15 of 47 positions shown, 18 images · non-contrast
Comparison: Head CT and brain MRI February 2015

CLINICAL DATA: Intermittent headache for 1 week.

EXAM:
CT HEAD WITHOUT CONTRAST
TECHNIQUE: Contiguous axial images were obtained from the base of the skull
through the vertex without intravenous contrast.

[Series 3: head 5.0 h30s · axial · 0.42mm/px · z∈[-222,-97]mm · 9 of 31 slices shown, 12 images]
[im 3/31  brain]
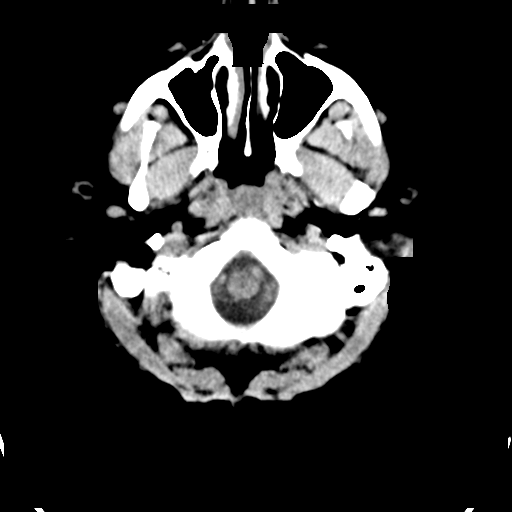
[im 3/31  bone]
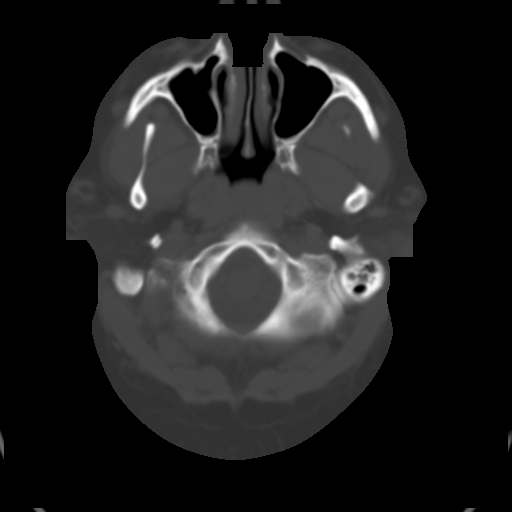
[im 6/31  brain]
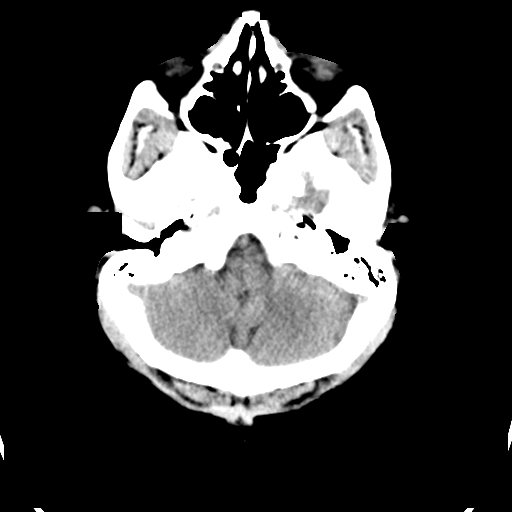
[im 9/31  brain]
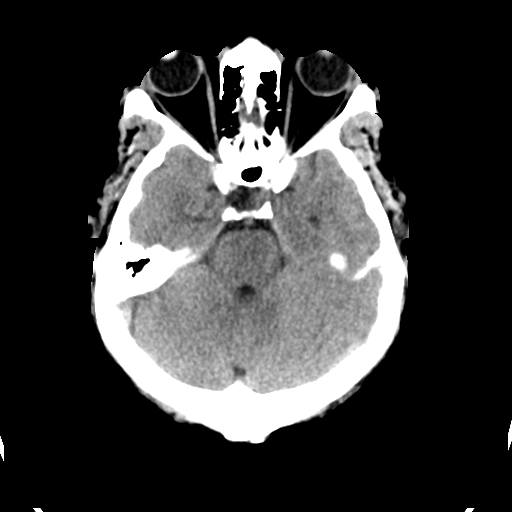
[im 12/31  brain]
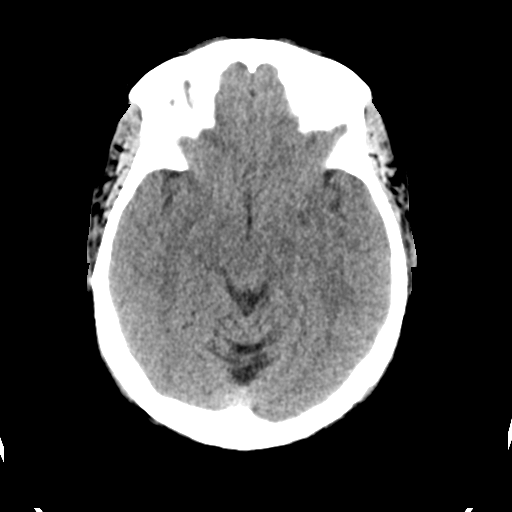
[im 16/31  brain]
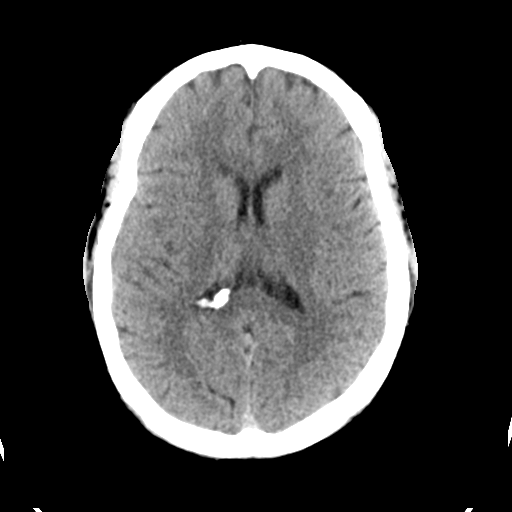
[im 16/31  bone]
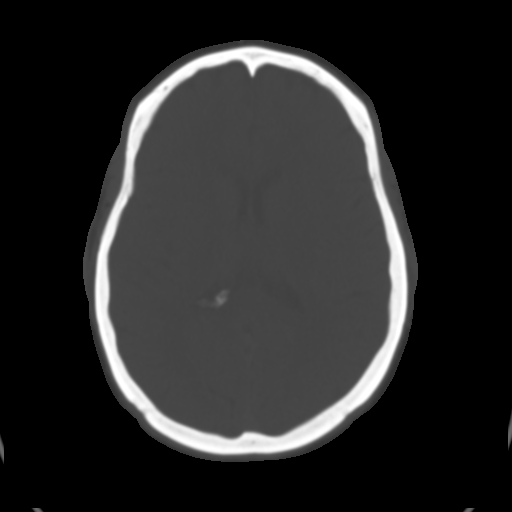
[im 19/31  brain]
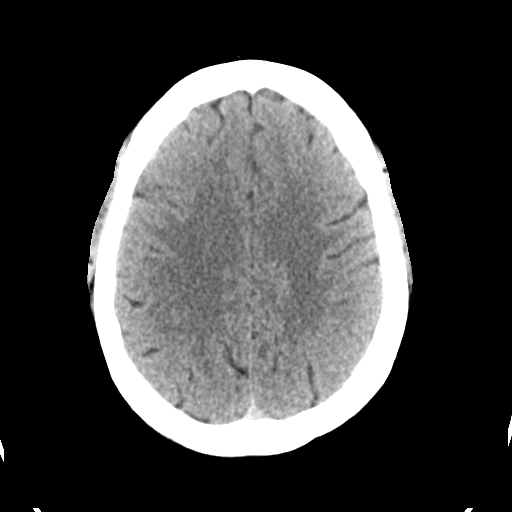
[im 22/31  brain]
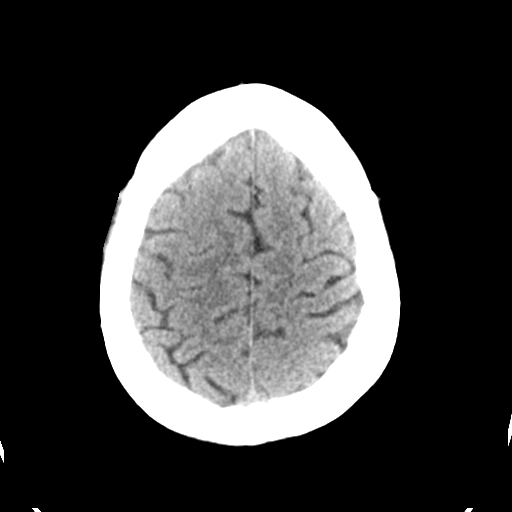
[im 25/31  brain]
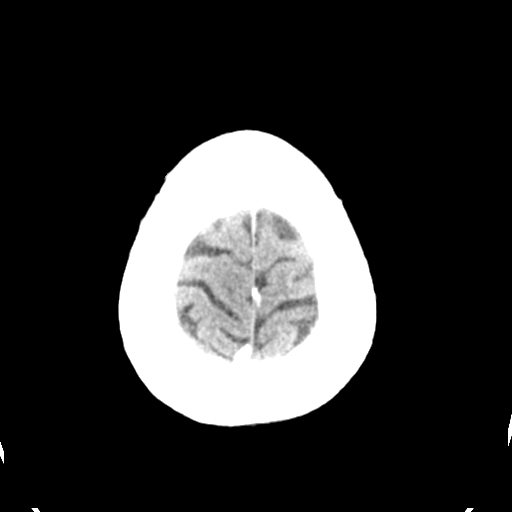
[im 28/31  brain]
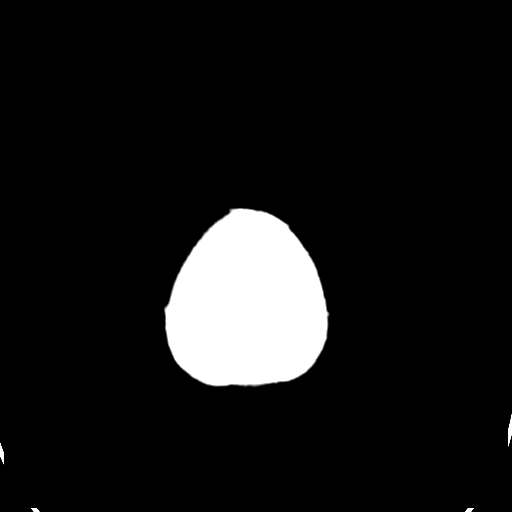
[im 28/31  bone]
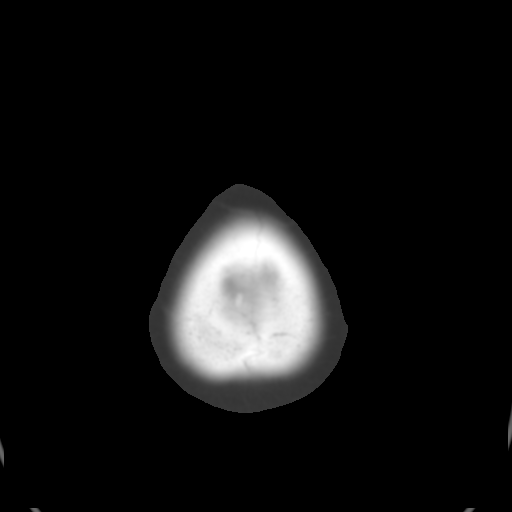

[Series 5: head 3.0 mpr cor · coronal · 0.31mm/px · 3 of 72 slices shown]
[im 24/72  brain]
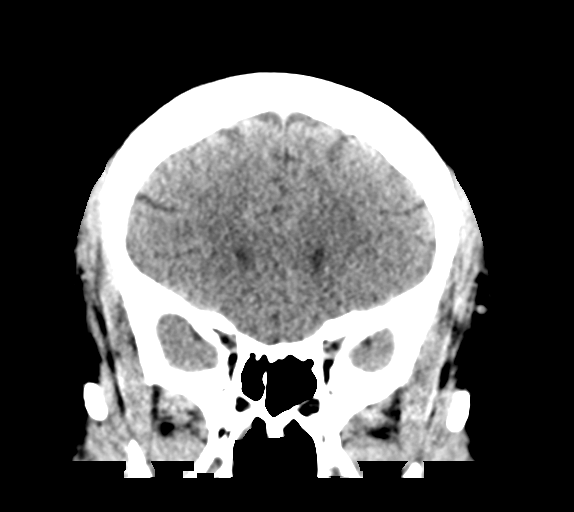
[im 32/72  brain]
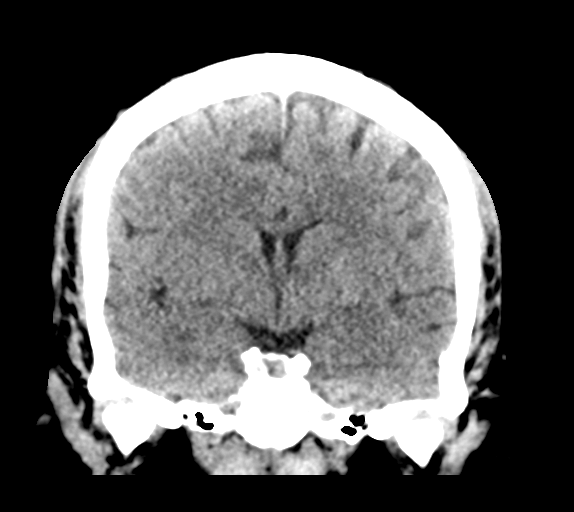
[im 40/72  brain]
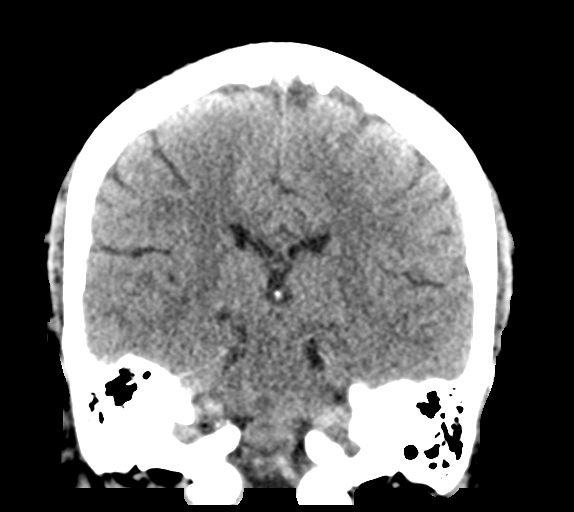

[Series 6: head 3.0 mpr sag · sagittal · 0.33mm/px · 3 of 60 slices shown]
[im 20/60  brain]
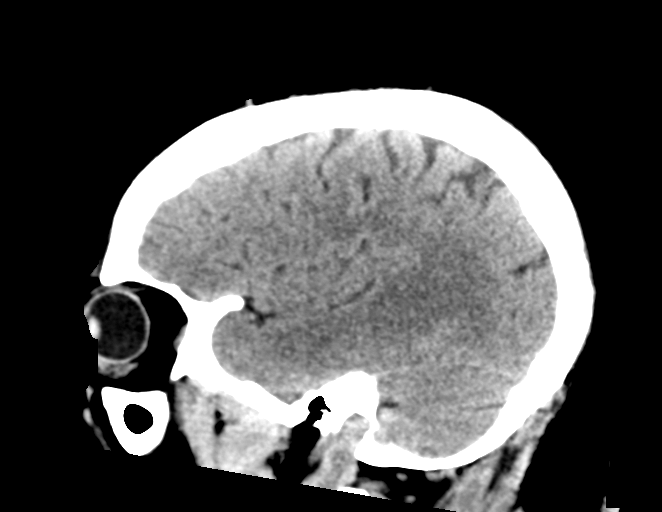
[im 30/60  brain]
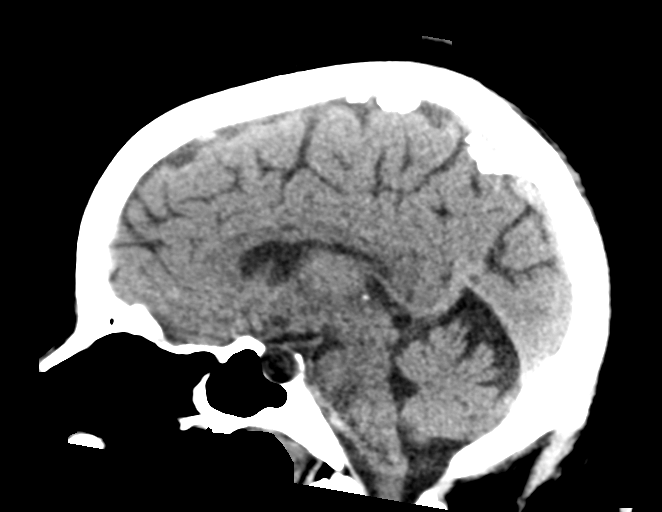
[im 40/60  brain]
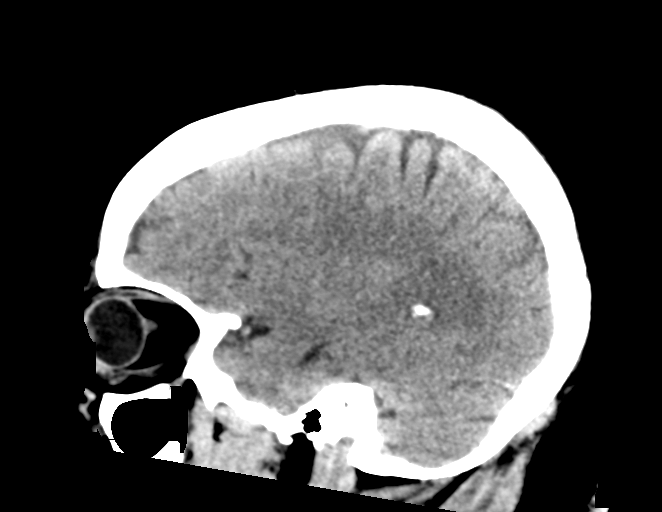

[15 of 47 positions shown; findings below may reference images not displayed]

FINDINGS: Brain: 10 mm cystic right sellar mass is unchanged, better
appreciated on prior MRI. No intracranial hemorrhage, mass effect,
or midline shift. No hydrocephalus. The basilar cisterns are patent.
No evidence of territorial infarct or acute ischemia. No extra-axial
or intracranial fluid collection.

Vascular: No hyperdense vessel or unexpected calcification.

Skull: No fracture or focal lesion.

Sinuses/Orbits: Paranasal sinuses and mastoid air cells are clear.
The visualized orbits are unremarkable.

Other: None.
IMPRESSION: 1.  No acute intracranial abnormality.
2. Unchanged 10 mm sellar cyst consistent with a Rathke's cleft
cyst.

## 2019-04-15 IMAGING — DX DG CHEST 2V
2 series · 2 of 2 positions shown · non-contrast
Comparison: 03/27/2017, 05/21/2016, 12/14/2015

CLINICAL DATA: Chest pain

EXAM:
CHEST  2 VIEW

[w chest pa]
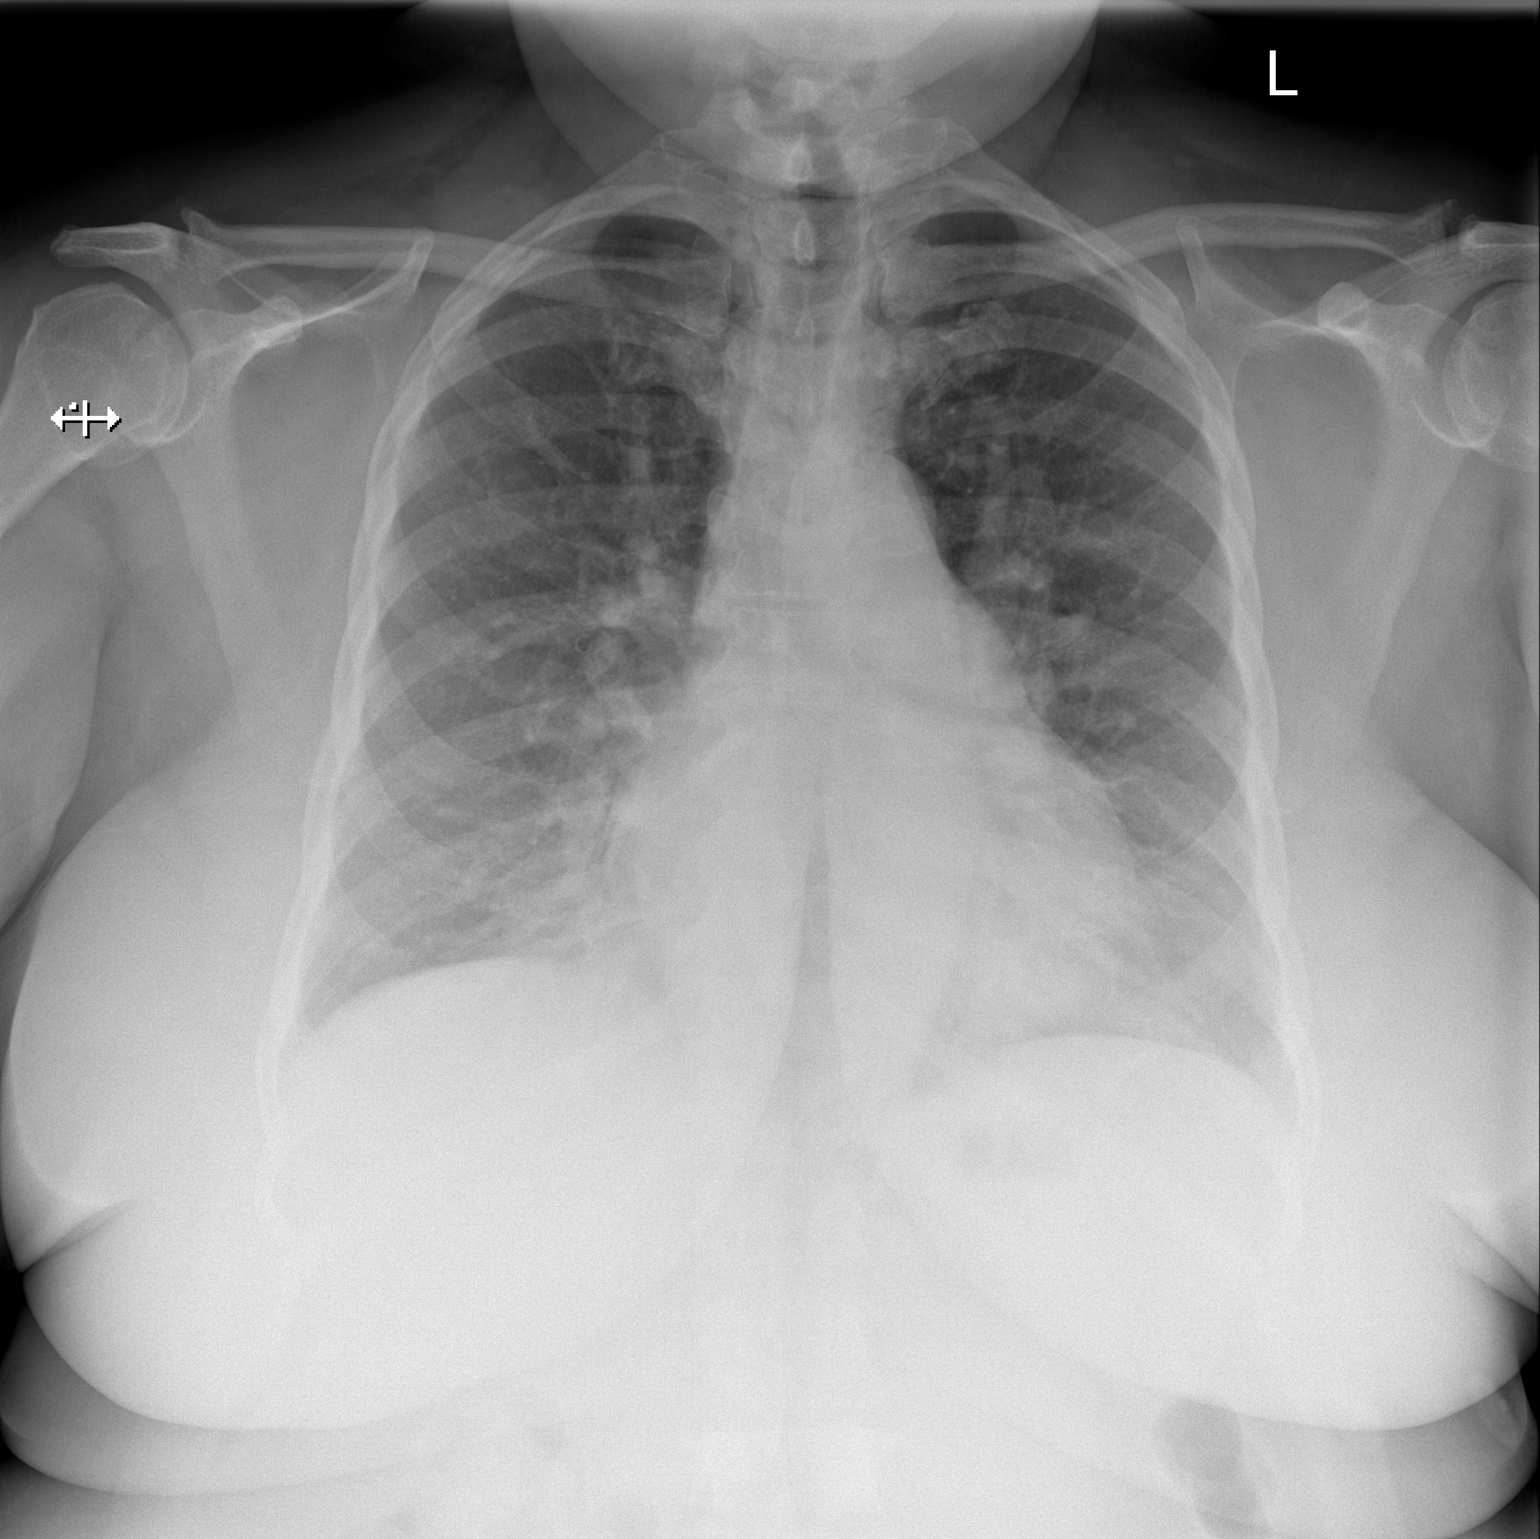

[w chest lat]
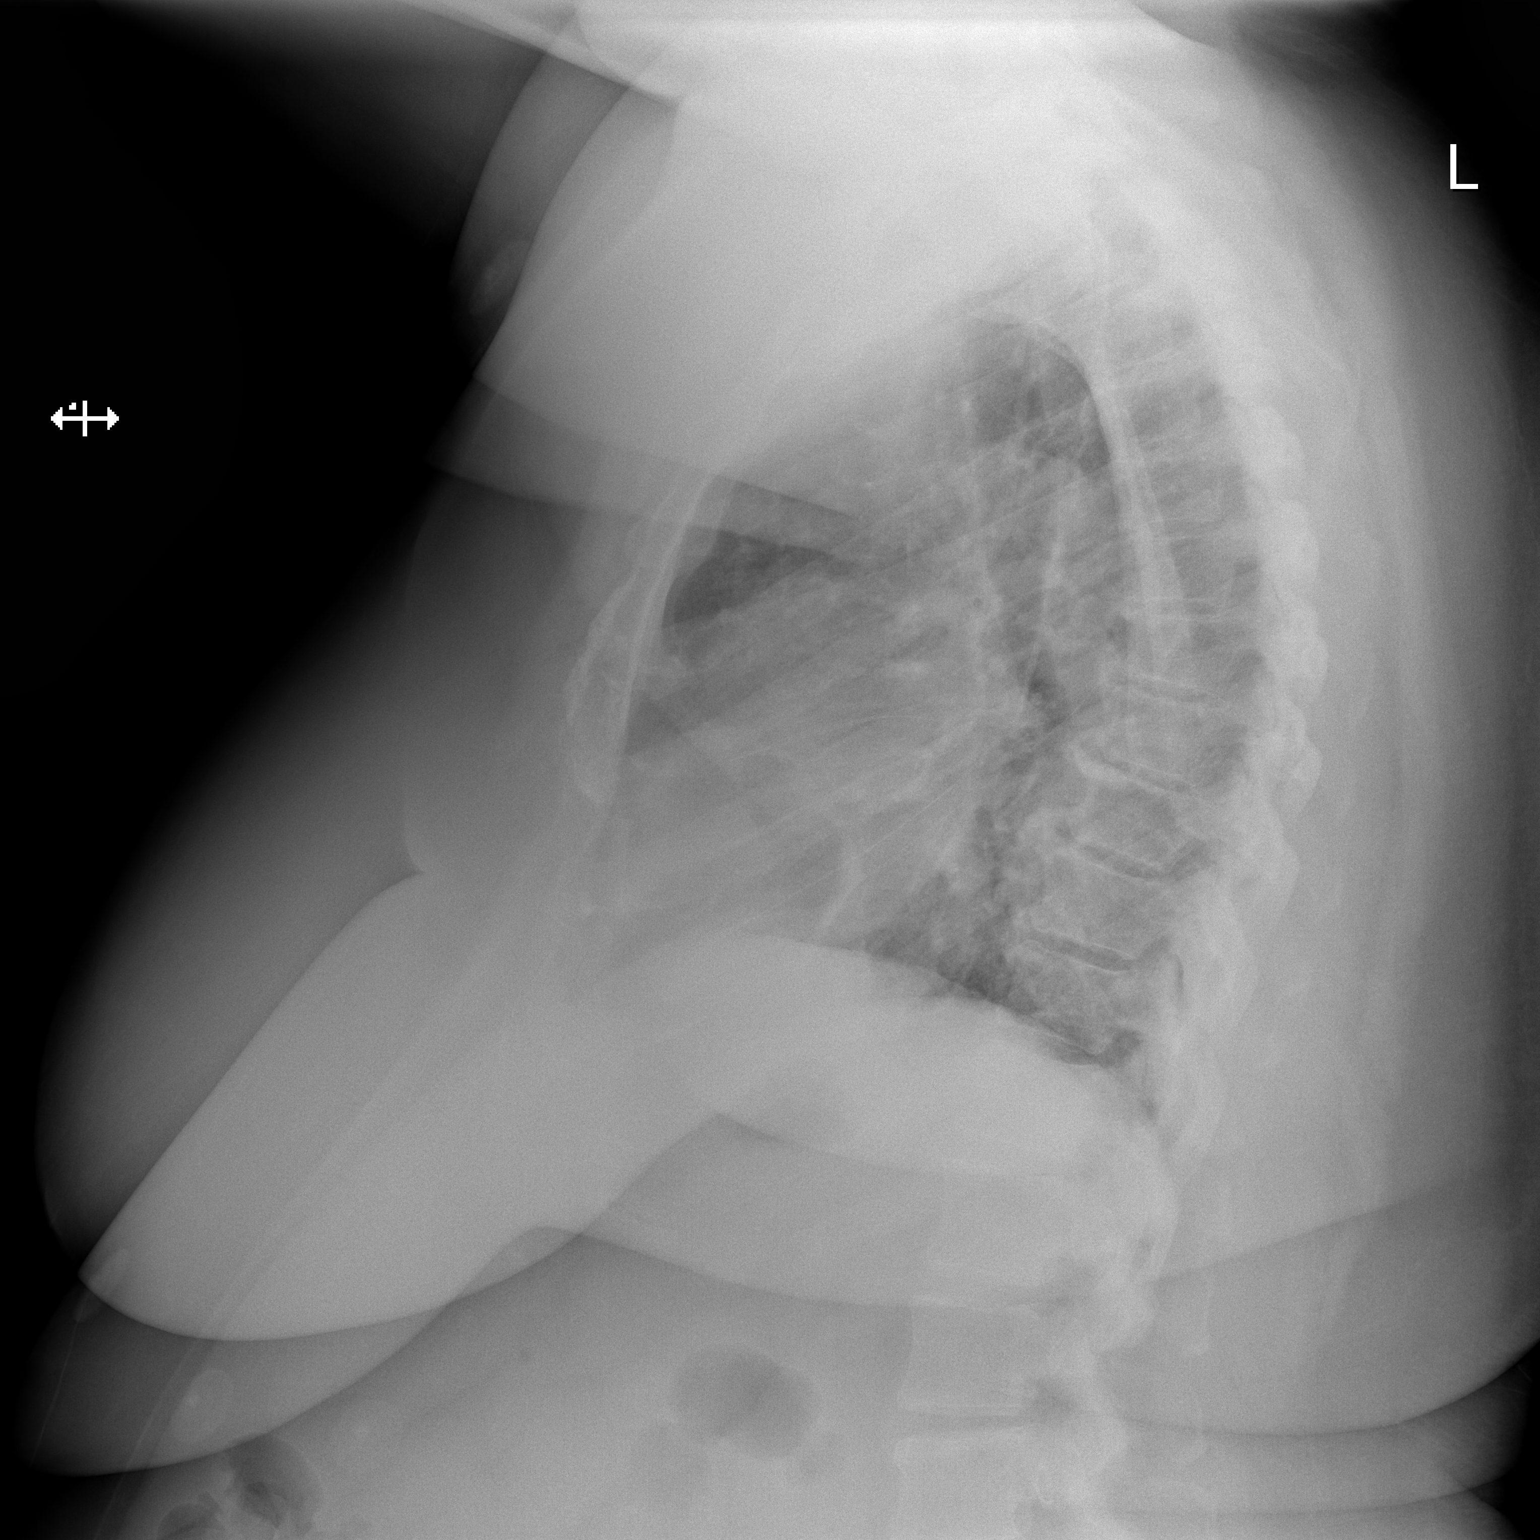

[2 of 2 positions shown; findings below may reference images not displayed]

FINDINGS: No focal consolidation or pleural effusion. Stable cardiomediastinal
silhouette. Vascular congestion. Mild bronchitic changes. No
pneumothorax.
IMPRESSION: 1. Stable cardiomediastinal silhouette with mild vascular congestion
2. Similar appearance of bronchitic changes

## 2019-04-16 ENCOUNTER — Other Ambulatory Visit (INDEPENDENT_AMBULATORY_CARE_PROVIDER_SITE_OTHER): Payer: Self-pay | Admitting: Family Medicine

## 2019-04-16 DIAGNOSIS — E559 Vitamin D deficiency, unspecified: Secondary | ICD-10-CM

## 2019-04-16 DIAGNOSIS — N3941 Urge incontinence: Secondary | ICD-10-CM

## 2019-04-16 IMAGING — MR MR HEAD W/O CM
9 of 10 series · 38 of 48 positions shown · non-contrast
Comparison: Head CT from yesterday

CLINICAL DATA: Stroke.

EXAM:
MRI HEAD WITHOUT CONTRAST
TECHNIQUE: Multiplanar, multiecho pulse sequences of the brain and surrounding
structures were obtained without intravenous contrast.

[Series 3: DWI · axial · 3.0mm · 1.09mm/px · z∈[-57,+80]mm · 11 of 94 slices shown (1 of 4)]
[im 1/94]
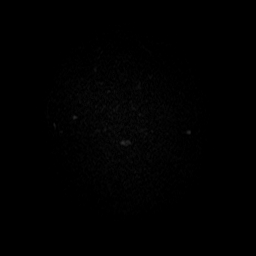
[im 10/94]
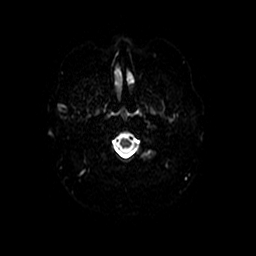
[im 19/94]
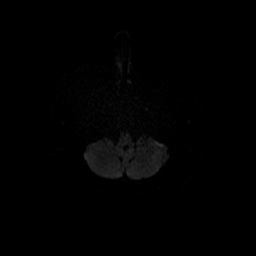
[im 28/94]
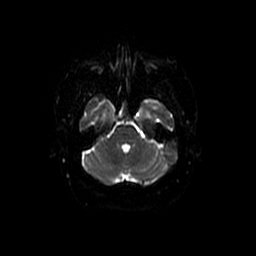
[im 38/94]
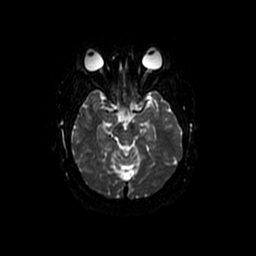
[im 47/94]
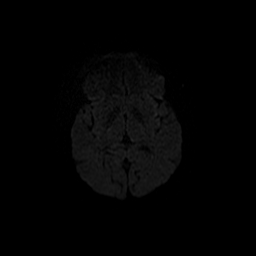
[im 56/94]
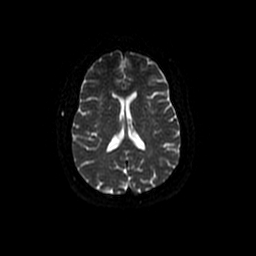
[im 66/94]
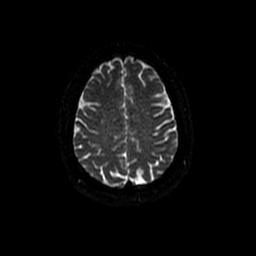
[im 75/94]
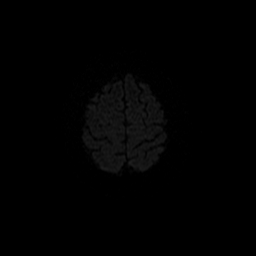
[im 84/94]
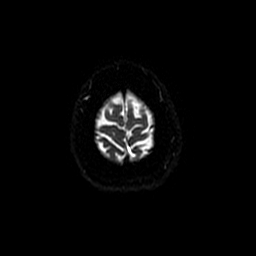
[im 94/94]
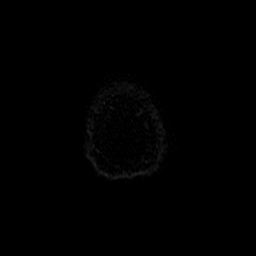

[Series 4: DWI · coronal · 5.0mm · 1.09mm/px · 7 of 68 slices shown (2 of 4)]
[im 1/68]
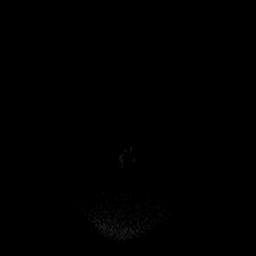
[im 12/68]
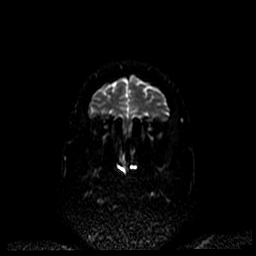
[im 23/68]
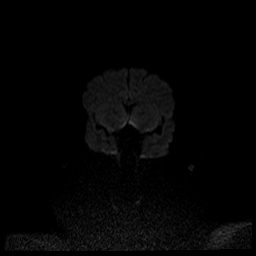
[im 34/68]
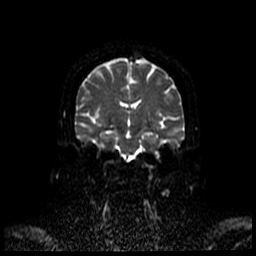
[im 45/68]
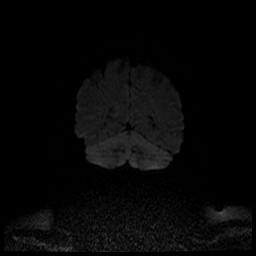
[im 56/68]
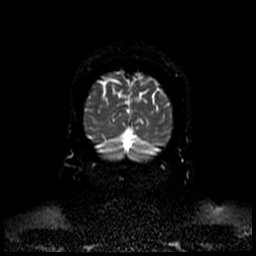
[im 68/68]
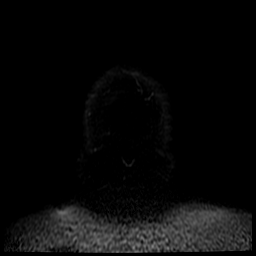

[Series 5: T1 · sagittal · 5.0mm · 0.43mm/px · 2 of 23 slices shown]
[im 1/23]
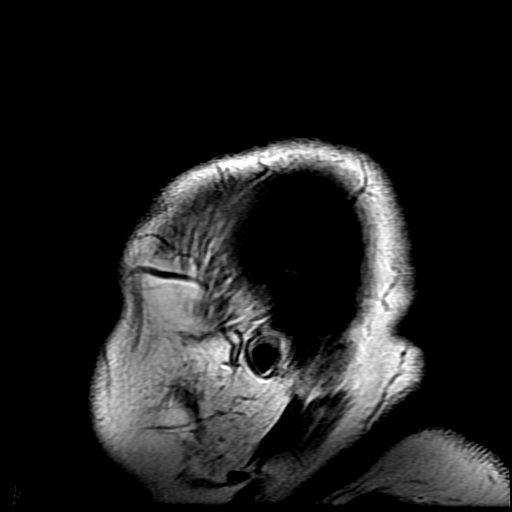
[im 23/23]
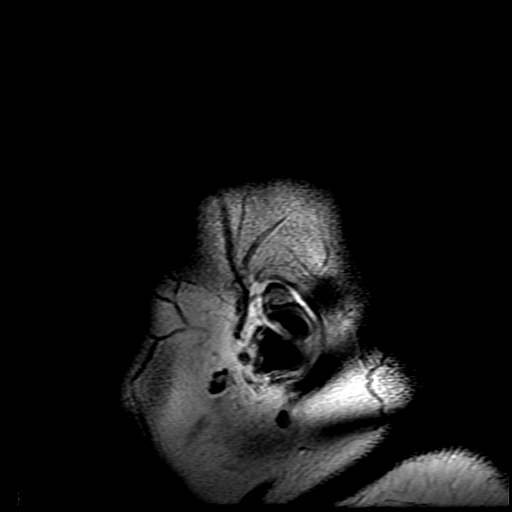

[Series 6: T2 · axial · 5.0mm · 0.43mm/px · z∈[-58,+79]mm · 2 of 24 slices shown (1 of 2)]
[im 1/24]
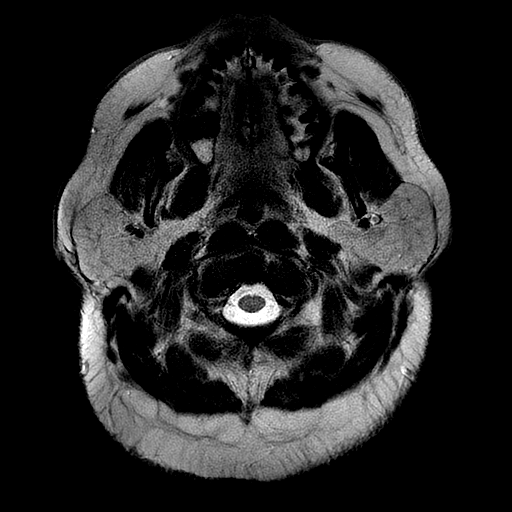
[im 24/24]
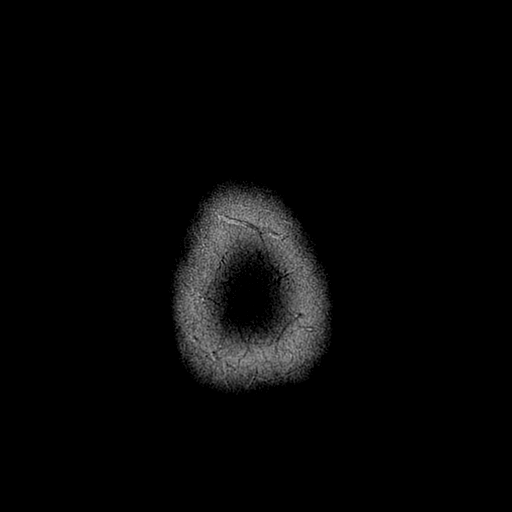

[Series 7: FLAIR · axial · 3.0mm · 0.43mm/px · z∈[-58,+79]mm · 2 of 24 slices shown]
[im 1/24]
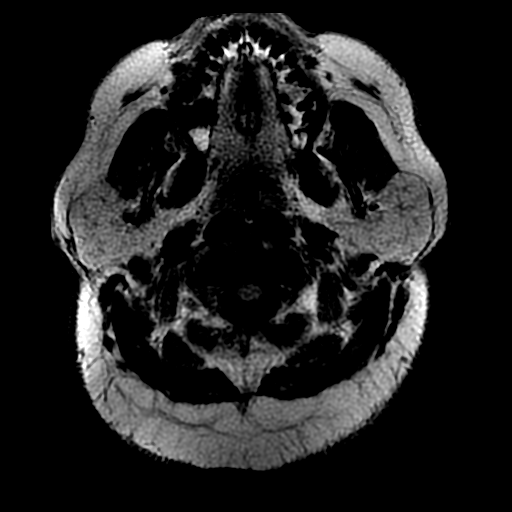
[im 24/24]
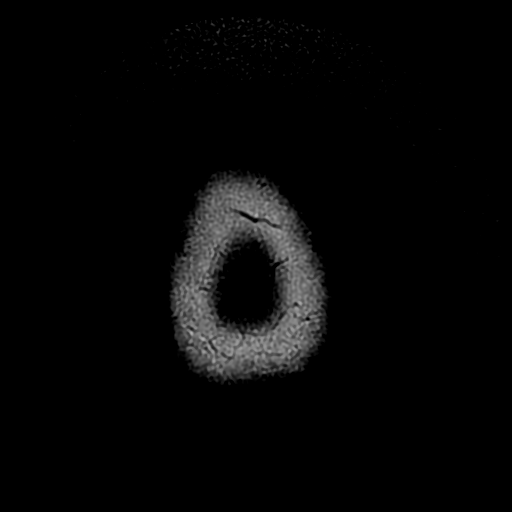

[Series 8: ax mpgr · axial · 5.0mm · 0.43mm/px · z∈[-58,+79]mm · 2 of 24 slices shown]
[im 1/24]
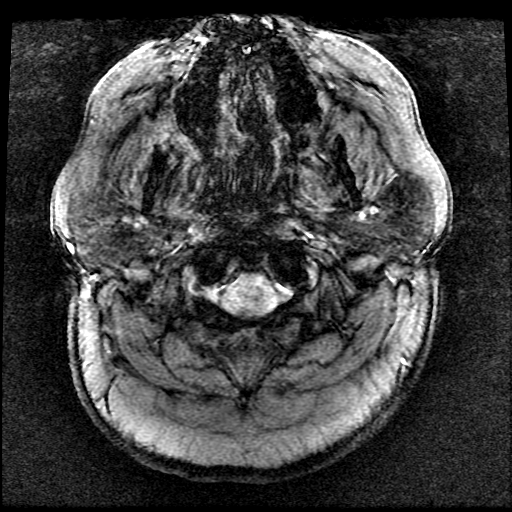
[im 24/24]
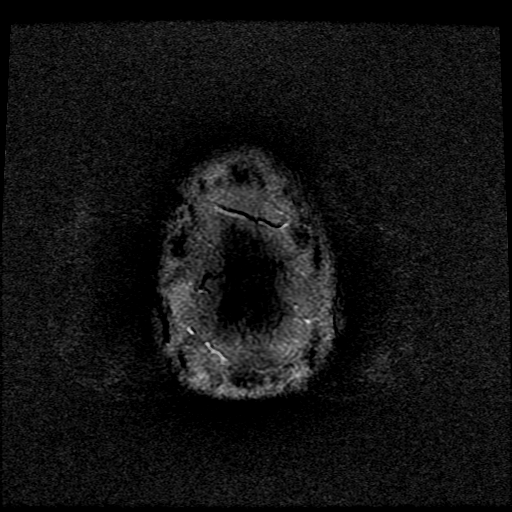

[Series 10: T2 · coronal · 5.0mm · 0.43mm/px · 3 of 29 slices shown (2 of 2)]
[im 1/29]
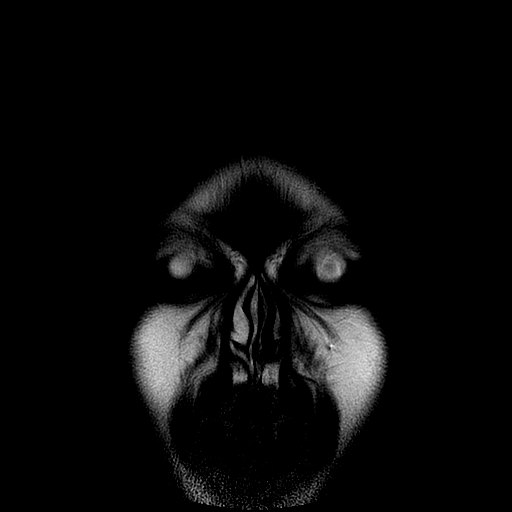
[im 15/29]
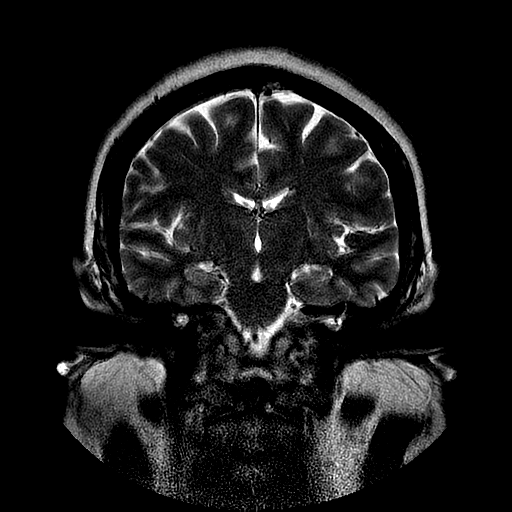
[im 29/29]
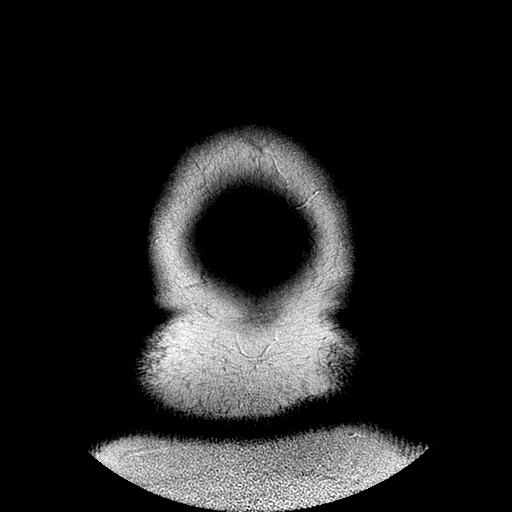

[Series 300: DWI · axial · 3.0mm · 1.09mm/px · z∈[-57,+80]mm · 5 of 47 slices shown (3 of 4)]
[im 1/47]
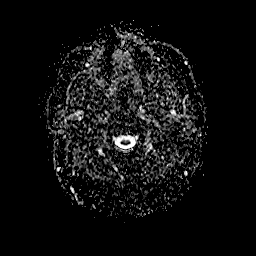
[im 12/47]
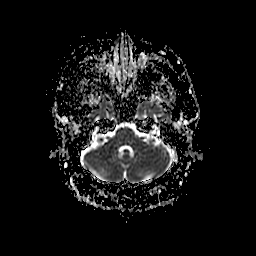
[im 24/47]
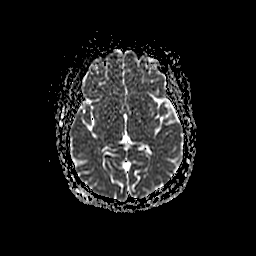
[im 35/47]
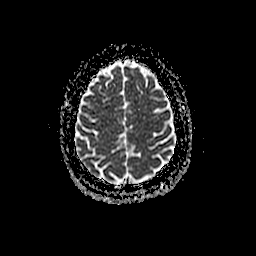
[im 47/47]
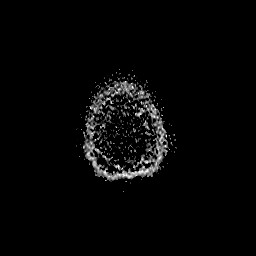

[Series 400: DWI · coronal · 5.0mm · 1.09mm/px · 4 of 34 slices shown (4 of 4)]
[im 1/34]
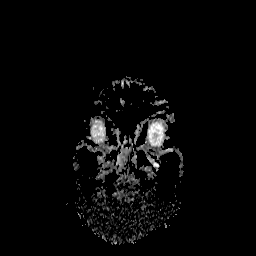
[im 12/34]
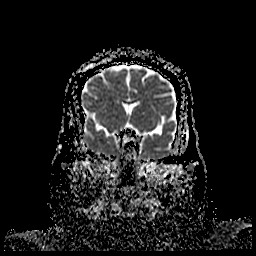
[im 23/34]
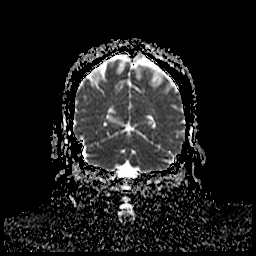
[im 34/34]
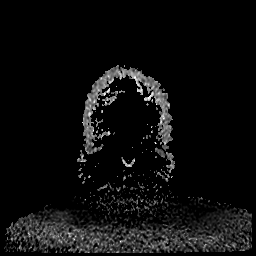

[38 of 48 positions shown; findings below may reference images not displayed]

FINDINGS: Brain: No acute infarction, hemorrhage, hydrocephalus, extra-axial
collection or mass effect. 13 mm cystic intensity structure in the
right lateral sella, with mild T2 hypointense complexity along the
midline wall. The structure is also slightly hyperintense to CSF on
FLAIR. Findings correlate with history of Rathke's cleft cyst. Rare
FLAIR hyperintensities in the cerebral white matter that are stable.

Vascular: Major flow voids are preserved.

Skull and upper cervical spine: No evidence of marrow lesion

Sinuses/Orbits: Negative
IMPRESSION: 1. No acute finding including infarct.
2. 13 mm sellar cyst that is stable from 9040.

## 2019-05-01 ENCOUNTER — Ambulatory Visit (INDEPENDENT_AMBULATORY_CARE_PROVIDER_SITE_OTHER): Payer: Medicare Other | Admitting: Bariatrics

## 2019-05-01 ENCOUNTER — Ambulatory Visit (INDEPENDENT_AMBULATORY_CARE_PROVIDER_SITE_OTHER): Payer: Medicare Other | Admitting: Family Medicine

## 2019-05-08 ENCOUNTER — Other Ambulatory Visit: Payer: Self-pay | Admitting: Surgical

## 2019-05-08 NOTE — Telephone Encounter (Signed)
Please advise. Thanks.  

## 2019-05-09 ENCOUNTER — Other Ambulatory Visit (INDEPENDENT_AMBULATORY_CARE_PROVIDER_SITE_OTHER): Payer: Self-pay | Admitting: Family Medicine

## 2019-05-09 DIAGNOSIS — E119 Type 2 diabetes mellitus without complications: Secondary | ICD-10-CM

## 2019-05-12 ENCOUNTER — Telehealth: Payer: Self-pay | Admitting: Internal Medicine

## 2019-05-12 NOTE — Telephone Encounter (Signed)
DOD 04/08/19   Dr. Hilarie Fredrickson, this patient was referred for a colonoscopy.  She has GI hx at [facility] and requested to transfer care to [facility].  Pt's previous colonoscopy records will be sent to you for review.

## 2019-05-14 ENCOUNTER — Ambulatory Visit (INDEPENDENT_AMBULATORY_CARE_PROVIDER_SITE_OTHER): Payer: Medicare Other | Admitting: Family Medicine

## 2019-05-15 ENCOUNTER — Other Ambulatory Visit: Payer: Self-pay

## 2019-05-15 ENCOUNTER — Ambulatory Visit (INDEPENDENT_AMBULATORY_CARE_PROVIDER_SITE_OTHER): Payer: Medicare Other | Admitting: Family Medicine

## 2019-05-15 ENCOUNTER — Ambulatory Visit: Payer: Medicare Other | Admitting: Cardiovascular Disease

## 2019-05-15 ENCOUNTER — Encounter (INDEPENDENT_AMBULATORY_CARE_PROVIDER_SITE_OTHER): Payer: Self-pay | Admitting: Family Medicine

## 2019-05-15 VITALS — BP 144/81 | HR 69 | Temp 98.0°F | Ht 62.0 in | Wt 220.0 lb

## 2019-05-15 DIAGNOSIS — E559 Vitamin D deficiency, unspecified: Secondary | ICD-10-CM | POA: Diagnosis not present

## 2019-05-15 DIAGNOSIS — Z6841 Body Mass Index (BMI) 40.0 and over, adult: Secondary | ICD-10-CM

## 2019-05-15 DIAGNOSIS — F3289 Other specified depressive episodes: Secondary | ICD-10-CM | POA: Diagnosis not present

## 2019-05-15 MED ORDER — VITAMIN D (ERGOCALCIFEROL) 1.25 MG (50000 UNIT) PO CAPS: 50000.0000 [IU] | ORAL_CAPSULE | ORAL | 0 refills | Status: DC
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
## 2019-05-15 NOTE — Progress Notes (Signed)
Chief Complaint:   OBESITY Lindasy is here to discuss her progress with her obesity treatment plan along with follow-up of her obesity related diagnoses. Seynabou is on the Category 2 Plan and states she is following her eating plan approximately 70% of the time. Hiromy states she is walking for 10-15 minutes 3 times per week.  Today's visit was #: 23 Starting weight: 225 lbs Starting date: 11/20/2017 Today's weight: 220 lbs Today's date: 05/15/2019 Total lbs lost to date: 5 lbs Total lbs lost since last in-office visit: 0  Interim History: Hiliary's daughter was recently diagnosed with bipolar (schizophrenia).  Adriann has been very stressed and worried.  She is skipping breakfast and not eating in a healthy manner.  She reports eating sweets and eating out at dinner.  Subjective:   1. Vitamin D deficiency Glendi's Vitamin D level is not at goal (was 41.8 on 12/26/2018). She is currently taking RX vit D.   2. Other depression with emotional eating  She is struggling with stress a great deal due to issues with her daughter.  She reports she is sleeping more and her "house is a wreck".  She is also helping her mother who is in poor health.  She denies suicidal or homicidal ideation.  Assessment/Plan:   1. Vitamin D deficiency Low Vitamin D level contributes to fatigue and are associated with obesity, breast, and colon cancer. She agrees to continue to take prescription Vitamin D @50 ,000 IU every week and will follow-up for routine testing of Vitamin D, at least 2-3 times per year to avoid over-replacement. - Vitamin D, Ergocalciferol, (DRISDOL) 1.25 MG (50000 UNIT) CAPS capsule; Take 1 capsule (50,000 Units total) by mouth every 7 (seven) days.  Dispense: 4 capsule; Refill: 0  2. Other depression with emotional eating  Patient was referred to Dr. [name] Mussel, our Bariatric Psychologist, for evaluation due to significant struggles with emotional eating.  Continue all medications.  3. Class 3  severe obesity with serious comorbidity and body mass index (BMI) of 40.0 to 44.9 in adult, unspecified obesity type (HCC) Izabel is currently in the action stage of change. As such, her goal is to continue with weight loss efforts. She has agreed to the Category 2 Plan and keeping a food journal and adhering to recommended goals of 400-500 calories and 35 grams of protein.   Exercise goals: will continue current exercise regimen for weight loss and overall health benefits.  Behavioral modification strategies: increasing lean protein intake, decreasing simple carbohydrates, no skipping meals and planning for success.  Handout:  Eating Out and Journaling  Bralee has agreed to follow-up with our clinic in 3 weeks. She was informed of the importance of frequent follow-up visits to maximize her success with intensive lifestyle modifications for her multiple health conditions.   Objective:   Blood pressure (!) 144/81, pulse 69, temperature 98 F (36.7 C), temperature source Oral, height 5\' 2"  (1.575 m), weight 220 lb (99.8 kg), SpO2 96 %. Body mass index is 40.24 kg/m.  General: Cooperative, alert, well developed, in no acute distress. HEENT: Conjunctivae and lids unremarkable. Cardiovascular: Regular rhythm.  Lungs: Normal work of breathing. Neurologic: No focal deficits.   Lab Results  Component Value Date   CREATININE 0.95 03/06/2019   BUN 12 03/06/2019   NA 141 03/06/2019   K 3.4 (L) 03/06/2019   CL 105 03/06/2019   CO2 27 03/06/2019   Lab Results  Component Value Date   ALT 28 03/05/2019   AST  18 03/05/2019   ALKPHOS 44 03/05/2019   BILITOT 0.5 03/05/2019   Lab Results  Component Value Date   HGBA1C 6.3 (H) 12/26/2018   HGBA1C 6.6 (H) 05/22/2018   HGBA1C 6.8 (H) 03/04/2018   HGBA1C 8.8 (H) 11/20/2017   HGBA1C 7.4 (H) 08/11/2016   Lab Results  Component Value Date   INSULIN 28.1 (H) 12/26/2018   INSULIN 20.2 03/04/2018   INSULIN 53.9 (H) 11/20/2017   Lab Results   Component Value Date   TSH 0.458 03/05/2019   Lab Results  Component Value Date   CHOL 119 12/26/2018   HDL 58 12/26/2018   LDLCALC 41 12/26/2018   TRIG 108 12/26/2018   CHOLHDL 4.2 04/23/2011   CHOLHDL 4.3 04/23/2011   Lab Results  Component Value Date   WBC 5.4 03/06/2019   HGB 11.5 (L) 03/06/2019   HCT 35.5 (L) 03/06/2019   MCV 85.3 03/06/2019   PLT 197 03/06/2019   Obesity Behavioral Intervention Documentation for Insurance:   Approximately 15 minutes were spent on the discussion below.  ASK: We discussed the diagnosis of obesity with [name] today and Shemia agreed to give [name] permission to discuss obesity behavioral modification therapy today.  ASSESS: Dallas has the diagnosis of obesity and her BMI today is 40.4. Shontae is in the action stage of change.   ADVISE: Munni was educated on the multiple health risks of obesity as well as the benefit of weight loss to improve her health. She was advised of the need for long term treatment and the importance of lifestyle modifications to improve her current health and to decrease her risk of future health problems.  AGREE: Multiple dietary modification options and treatment options were discussed and Candase agreed to follow the recommendations documented in the above note.  ARRANGE: Kassia was educated on the importance of frequent visits to treat obesity as outlined per CMS and USPSTF guidelines and agreed to schedule her next follow up appointment today.  Attestation Statements:   Reviewed by clinician on day of visit: allergies, medications, problem list, medical history, surgical history, family history, social history, and previous encounter notes.  I, Water quality scientist, CMA, am acting as Location manager for [employer], FNP-C.  I have reviewed the above documentation for accuracy and completeness, and I agree with the above. -  Georgianne Fick, FNP

## 2019-05-16 ENCOUNTER — Encounter (INDEPENDENT_AMBULATORY_CARE_PROVIDER_SITE_OTHER): Payer: Self-pay | Admitting: Family Medicine

## 2019-05-19 ENCOUNTER — Emergency Department (HOSPITAL_COMMUNITY)
Admission: EM | Admit: 2019-05-19 | Discharge: 2019-05-19 | Disposition: A | Discharge status: Home / Self Care | Payer: Medicare Other | Attending: Emergency Medicine | Admitting: Emergency Medicine

## 2019-05-19 ENCOUNTER — Encounter (HOSPITAL_COMMUNITY): Payer: Self-pay

## 2019-05-19 ENCOUNTER — Other Ambulatory Visit: Payer: Self-pay

## 2019-05-19 ENCOUNTER — Emergency Department (HOSPITAL_COMMUNITY): Payer: Medicare Other

## 2019-05-19 DIAGNOSIS — Z87891 Personal history of nicotine dependence: Secondary | ICD-10-CM | POA: Insufficient documentation

## 2019-05-19 DIAGNOSIS — E119 Type 2 diabetes mellitus without complications: Secondary | ICD-10-CM | POA: Diagnosis not present

## 2019-05-19 DIAGNOSIS — R0602 Shortness of breath: Secondary | ICD-10-CM | POA: Diagnosis not present

## 2019-05-19 DIAGNOSIS — Z86711 Personal history of pulmonary embolism: Secondary | ICD-10-CM | POA: Diagnosis not present

## 2019-05-19 DIAGNOSIS — R079 Chest pain, unspecified: Secondary | ICD-10-CM | POA: Diagnosis present

## 2019-05-19 DIAGNOSIS — I10 Essential (primary) hypertension: Secondary | ICD-10-CM | POA: Diagnosis not present

## 2019-05-19 LAB — COMPREHENSIVE METABOLIC PANEL
ALT: 25 U/L (ref 0–44)
AST: 23 U/L (ref 15–41)
Albumin: 3.5 g/dL (ref 3.5–5.0)
Alkaline Phosphatase: 51 U/L (ref 38–126)
Anion gap: 11 (ref 5–15)
BUN: 18 mg/dL (ref 6–20)
CO2: 26 mmol/L (ref 22–32)
Calcium: 9.2 mg/dL (ref 8.9–10.3)
Chloride: 104 mmol/L (ref 98–111)
Creatinine, Ser: 1 mg/dL (ref 0.44–1.00)
GFR calc Af Amer: >60 mL/min (ref >60)
GFR calc non Af Amer: >60 mL/min (ref >60)
Glucose, Bld: 140 mg/dL — ABNORMAL HIGH (ref 70–99)
Potassium: 3.8 mmol/L (ref 3.5–5.1)
Sodium: 141 mmol/L (ref 135–145)
Total Bilirubin: 0.6 mg/dL (ref 0.3–1.2)
Total Protein: 6 g/dL — ABNORMAL LOW (ref 6.5–8.1)

## 2019-05-19 LAB — CBC WITH DIFFERENTIAL/PLATELET
Abs Immature Granulocytes: 0.02 10*3/uL (ref 0.00–0.07)
Basophils Absolute: 0.1 10*3/uL (ref 0.0–0.1)
Basophils Relative: 1 %
Eosinophils Absolute: 0.2 10*3/uL (ref 0.0–0.5)
Eosinophils Relative: 4 %
HCT: 38.7 % (ref 36.0–46.0)
Hemoglobin: 12.5 g/dL (ref 12.0–15.0)
Immature Granulocytes: 0 %
Lymphocytes Relative: 46 %
Lymphs Abs: 3 10*3/uL (ref 0.7–4.0)
MCH: 28.5 pg (ref 26.0–34.0)
MCHC: 32.3 g/dL (ref 30.0–36.0)
MCV: 88.2 fL (ref 80.0–100.0)
Monocytes Absolute: 0.6 10*3/uL (ref 0.1–1.0)
Monocytes Relative: 10 %
Neutro Abs: 2.5 10*3/uL (ref 1.7–7.7)
Neutrophils Relative %: 39 %
Platelets: 208 10*3/uL (ref 150–400)
RBC: 4.39 MIL/uL (ref 3.87–5.11)
RDW: 13.4 % (ref 11.5–15.5)
WBC: 6.4 10*3/uL (ref 4.0–10.5)
nRBC: 0 % (ref 0.0–0.2)

## 2019-05-19 LAB — TROPONIN I (HIGH SENSITIVITY)
Troponin I (High Sensitivity): 11 ng/L (ref <18)
Troponin I (High Sensitivity): 12 ng/L (ref <18)

## 2019-05-19 LAB — BRAIN NATRIURETIC PEPTIDE: B Natriuretic Peptide: 16.5 pg/mL (ref 0.0–100.0)

## 2019-05-19 MED ORDER — FENTANYL CITRATE (PF) 100 MCG/2ML IJ SOLN
50.0000 ug | Freq: Once | INTRAMUSCULAR | Status: AC
  Administered 2019-05-19: 07:00:00 50 ug via INTRAVENOUS
  Filled 2019-05-19: qty 2

## 2019-05-19 MED ORDER — KETOROLAC TROMETHAMINE 15 MG/ML IJ SOLN
15.0000 mg | Freq: Once | INTRAMUSCULAR | Status: AC
  Administered 2019-05-19: 15 mg via INTRAVENOUS
  Filled 2019-05-19: qty 1

## 2019-05-19 NOTE — ED Provider Notes (Signed)
Emergency Department Provider Note   I have reviewed the triage vital signs and the nursing notes.   HISTORY  Chief Complaint No chief complaint on file.   HPI [name] is a 59 y.o. female who presents to the emergency department today via EMS secondary to chest and shoulder pain.  Patient states that she has a history of heart failure on diuretics.  She woke up tonight with severe bilateral shoulder pain and though she was short of breath and started having chest pain as well.  She describes it as sharp and heavy.  She also states that her abdomen seems to be little bit more bloated than she is used to.  No nausea or vomiting.  She states she was diaphoretic initially so she called EMS.  EMS arrival she had normal vital signs and EKG was done which showed some diffuse ST depressions mild ST elevation in 1 and aVL but not meeting STEMI criteria.  She was given nitro which helped her pain initially but her blood pressure had a significant decrease.  She is brought here for further evaluation.  On review of the records it does appear she is been admitted a couple times in the past with unstable angina and CHF exacerbations.   Cardiologist is [employer]. Nuclear stress in December normal.   Echo in November with grade I diastolic dysfunction.   No other associated or modifying symptoms.    Past Medical History:  Diagnosis Date  . Anginal pain ([place])    a. NL cath in 2008;  b. Myoview 03/2011: dec uptake along mid anterior wall on stress imaging -> ? attenuation vs. ischemia, EF 65%;  c. Echo 04/2011: EF 55-60%, no RWMA, Gr 2 dd  . Anxiety   . Arthritis   . Asthma   . Back pain   . Bone cancer ([place])   . Cancer ([name])   . Chest pain   . CHF (congestive heart failure) ([place])   . CHF (congestive heart failure) ([place])   . Depression   . Diabetes mellitus   . Drug use   . Dyspnea    with exertion  . Dyspnea   . Frequent urination   . GERD (gastroesophageal reflux disease)   .  Glaucoma   . HLD (hyperlipidemia)   . Hypertension   . IBS (irritable bowel syndrome)   . Joint pain   . Lactose intolerance   . Leg edema   . 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 Obesity   . Palpitations   . Pneumonia 05/2016   double  . Pulmonary edema   . Pulmonary embolism ([place])    a. 2008 -> coumadin x 6 mos.  . Rheumatoid arthritis ([place])   . Sleep apnea    on CPAP 02/2018  . TIA (transient ischemic attack)   . Urinary urgency     Patient Active Problem List   Diagnosis Date Noted  . Unilateral primary osteoarthritis, left hip   . Hip arthritis 06/11/2018  . Class 3 severe obesity with serious comorbidity and body mass index (BMI) of 40.0 to 44.9 in adult ([place]) 05/29/2018  . Other fatigue 11/20/2017  . Shortness of breath on exertion 11/20/2017  . Type 2 diabetes mellitus without complication, without long-term current use of insulin ([place]) 11/20/2017  . Vitamin D deficiency 11/20/2017  . Depression 11/20/2017  . Other hyperlipidemia 11/20/2017  . 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 Presence of left  artificial knee joint 09/20/2016  . Arthritis of knee 08/22/2016  . Cervical radiculopathy 07/26/2016  . Cervical disc disorder with radiculopathy 07/26/2016  . Right arm pain 06/29/2016  . Cervicalgia 06/29/2016  . Primary osteoarthritis of left knee 06/29/2016  . Hypersomnia with sleep apnea 11/18/2015  . Lethargy 11/18/2015  . Chest pain 09/30/2014  . Abnormal cardiac function test 09/29/2014  . Chest pain with moderate risk for cardiac etiology 09/28/2014  . HTN (hypertension) 09/28/2014  . Hypokalemia   . Paresthesia 06/18/2014  . Numbness and tingling of left arm and leg   . Unstable angina ([place]) 05/24/2014  . OSA on CPAP 05/24/2014  . Diabetes mellitus type 2 in obese ([place]) 10/29/2012  . S/P laparoscopic appendectomy 06/04/2012  . Diastolic CHF ([place]) [id number]  . Mediastinal mass 01/09/2012  . SOB (shortness of breath) 06/20/2011   . Mediastinal abnormality 06/20/2011  . Dyslipidemia 12/23/2009  . GLAUCOMA 12/23/2009  . ARTHRITIS 12/23/2009  . 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 Class 2 severe obesity with serious comorbidity and body mass index (BMI) of 38.0 to 38.9 in adult ([place]) 09/13/2006  . ANXIETY STATE NOS 09/13/2006  . DISORDER, DEPRESSIVE NEC 09/13/2006  . CARPAL TUNNEL SYNDROME, MILD 09/13/2006  . Unspecified essential hypertension 09/13/2006  . IBS 09/13/2006  . DEGENERATION, LUMBAR/LUMBOSACRAL DISC 09/13/2006  . SYMPTOM, SWELLING/MASS/LUMP IN CHEST 09/13/2006  . PULMONARY EMBOLISM, HX OF 09/13/2006    Past Surgical History:  Procedure Laterality Date  . ABDOMINAL HYSTERECTOMY  2005  . APPENDECTOMY    . CARDIAC CATHETERIZATION     Normal  . CARDIAC CATHETERIZATION N/A 09/30/2014   Procedure: Left Heart Cath and Coronary Angiography;  Surgeon: Sherren Mocha, MD;  Location: [facility];  Service: Cardiovascular;  Laterality: N/A;  . LAPAROSCOPIC APPENDECTOMY N/A 06/03/2012   Procedure: APPENDECTOMY LAPAROSCOPIC;  Surgeon: Stark Klein, MD;  Location: [facility];  Service: General;  Laterality: N/A;  . Left knee surgery  2008  . LEG SURGERY    . TONSILLECTOMY    . TOTAL HIP ARTHROPLASTY Left 06/11/2018   Procedure: LEFT TOTAL HIP ARTHROPLASTY ANTERIOR APPROACH;  Surgeon: Meredith Pel, MD;  Location: [facility];  Service: Orthopedics;  Laterality: Left;  . TOTAL KNEE ARTHROPLASTY Left 08/22/2016   Procedure: TOTAL KNEE ARTHROPLASTY;  Surgeon: Meredith Pel, MD;  Location: [facility];  Service: Orthopedics;  Laterality: Left;  . TUBAL LIGATION  1989    Current Outpatient Rx  . Order #: [id number] Class: Historical Med  . Order #: [id number] Class: Historical Med  . Order #: [id number] Class: Historical Med  . Order #: [id number] Class: Historical Med  . Order #: [id number] Class: Historical Med  . Order #: [id number] Class: Historical Med  . Order #: [id number] Class: Historical Med  . Order #: [id number] Class:  Historical Med  . Order #: [id number] Class: Historical Med  . Order #: [id number] Class: Historical Med  . Order #: [id number] Class: Normal  . Order #: [id number] Class: Historical Med  . Order #: [id number] Class: Historical Med  . Order #: [id number] Class: Normal  . Order #: [id number] Class: Historical Med  . Order #: [id number] Class: Normal  . Order #: [id number] Class: Historical Med  . Order #: [id number] Class: Historical Med  . Order #: [id number] Class: Normal    Allergies Penicillins, Hydrocodone-acetaminophen, Latex, and Sulfa antibiotics  Family History  Problem Relation Age of Onset  . Emphysema Mother   . Arthritis Mother   . Heart failure Mother        alive @ 60  . Stroke Mother   . Diabetes  Mother   . Hypertension Mother   . Hyperlipidemia Mother   . Depression Mother   . Anxiety disorder Mother   . Asthma Brother   . Heart disease Father        died @ 15's.  . Stroke Father   . Diabetes Father   . Hyperlipidemia Father   . Hypertension Father   . Bipolar disorder Father   . Sleep apnea Father   . Alcoholism Father   . Drug abuse Father   . Diabetes Sister   . Heart disease Paternal Grandfather   . Colon cancer Maternal Grandfather   . Breast cancer Maternal Aunt     Social History Social History   Tobacco Use  . Smoking status: Former Smoker    Packs/day: 0.50    Years: 15.00    Pack years: 7.50    Types: Cigarettes    Quit date: 04/24/1985    Years since quitting: 34.0  . Smokeless tobacco: Never Used  Substance Use Topics  . Alcohol use: No    Alcohol/week: 0.0 standard drinks  . Drug use: Not Currently    Types: Marijuana    Review of Systems  All other systems negative except as documented in the HPI. All pertinent positives and negatives as reviewed in the HPI. ____________________________________________   PHYSICAL EXAM:  VITAL SIGNS: There were no vitals filed for this visit.   Constitutional: Alert and oriented. Well appearing and in no  acute distress. Eyes: Conjunctivae are normal. PERRL. EOMI. Head: Atraumatic. Nose: No congestion/rhinnorhea. Mouth/Throat: Mucous membranes are moist.  Oropharynx non-erythematous. Neck: No stridor.  No meningeal signs.   Cardiovascular: Normal rate, regular rhythm. Good peripheral circulation. Grossly normal heart sounds.   Respiratory: Normal respiratory effort.  No retractions. Lungs CTAB. Gastrointestinal: Soft and nontender. No distention.  Musculoskeletal: No lower extremity tenderness nor edema. No gross deformities of extremities. Neurologic:  Normal speech and language. No gross focal neurologic deficits are appreciated.  Skin:  Skin is warm, dry and intact. No rash noted.   ____________________________________________   LABS (all labs ordered are listed, but only abnormal results are displayed)  Labs Reviewed  CBC WITH DIFFERENTIAL/PLATELET  COMPREHENSIVE METABOLIC PANEL  BRAIN NATRIURETIC PEPTIDE  TROPONIN I (HIGH SENSITIVITY)   ____________________________________________  EKG   EKG Interpretation  Date/Time:    Ventricular Rate:    PR Interval:    QRS Duration:   QT Interval:    QTC Calculation:   R Axis:     Text Interpretation:         ____________________________________________  RADIOLOGY  No results found.  ____________________________________________   PROCEDURES  Procedure(s) performed:   Procedures   ____________________________________________   INITIAL IMPRESSION / ASSESSMENT AND PLAN / ED COURSE  Here with atypical sounding chest/shoulder pain with some sob. Similar to rpevious chf exacerbations. Exam benign. ecg similaro to multiple previous. Plan for wu for fluid overload vs acs. Less likely PE.    Pending reevaluation, labs and imaging prior to transfer of care.     Pertinent labs & imaging results that were available during my care of the patient were reviewed by me and considered in my medical decision making (see  chart for details).   FINAL CLINICAL IMPRESSION(S) / ED DIAGNOSES  Final diagnoses:  None     MEDICATIONS GIVEN DURING THIS VISIT:  Medications  fentaNYL (SUBLIMAZE) injection 50 mcg (has no administration in time range)     NEW OUTPATIENT MEDICATIONS STARTED DURING THIS VISIT:  New Prescriptions  No medications on file    Note:  This note was prepared with assistance of Dragon voice recognition software. Occasional wrong-word or sound-a-like substitutions may have occurred due to the inherent limitations of voice recognition software.   Merrily Pew, MD 05/19/19 2325

## 2019-05-19 NOTE — Discharge Instructions (Signed)
If you develop recurrent, continued, or worsening chest pain, shortness of breath, fever, vomiting, abdominal or back pain, or any other new/concerning symptoms then return to the ER for evaluation.  

## 2019-05-19 NOTE — ED Triage Notes (Signed)
Pt brought by EMS with c/o midsternal CP that radiates to L arm and L side of back that began yesterday. Pt given one SL nitro by EMS with temporary relief. Endorses chills and SOB. Denies any sick contacts. +nausea. Denies V/D

## 2019-05-19 NOTE — ED Provider Notes (Signed)
7:36 AM Care transferred to me.  Patient's initial work-up including ECG, labs, chest x-ray are benign.  Pain is now gone but it is now minimal.  She is feeling better.  I think she will need a second troponin.  If this is negative I think she can go home for outpatient work-up with her cardiologist. Low suspicion at this point for PE, dissection, ACS.  10:19 AM 2nd troponin negative.  At this point, patient has had multiple negative chest pain work-ups and I think is reasonable for her to be discharged home to follow-up with cardiology.  We discussed return precautions.   Sherwood Gambler, MD 05/19/19 1019

## 2019-05-21 ENCOUNTER — Ambulatory Visit (INDEPENDENT_AMBULATORY_CARE_PROVIDER_SITE_OTHER): Payer: Medicare Other | Admitting: Psychology

## 2019-05-21 ENCOUNTER — Other Ambulatory Visit: Payer: Self-pay

## 2019-05-21 DIAGNOSIS — F330 Major depressive disorder, recurrent, mild: Secondary | ICD-10-CM

## 2019-05-21 NOTE — Telephone Encounter (Signed)
As per Dr. Hilarie Fredrickson: Normal colon without polyps.  Feb 2023 recall placed.  Pt reported no Fhx of colon cancer.  Pt is aware.  Records filed in drawer.

## 2019-05-30 IMAGING — MG DIGITAL SCREENING BILATERAL MAMMOGRAM WITH TOMO AND CAD
1 series · 2 of 5 positions shown · non-contrast
Comparison: Previous exam(s).

ACR Breast Density Category a: The breast tissue is almost entirely
fatty.

CLINICAL DATA: Screening.

EXAM:
DIGITAL SCREENING BILATERAL MAMMOGRAM WITH TOMO AND CAD

[L CC tomo · 2 of 88 frames shown]
[frame 29/88]
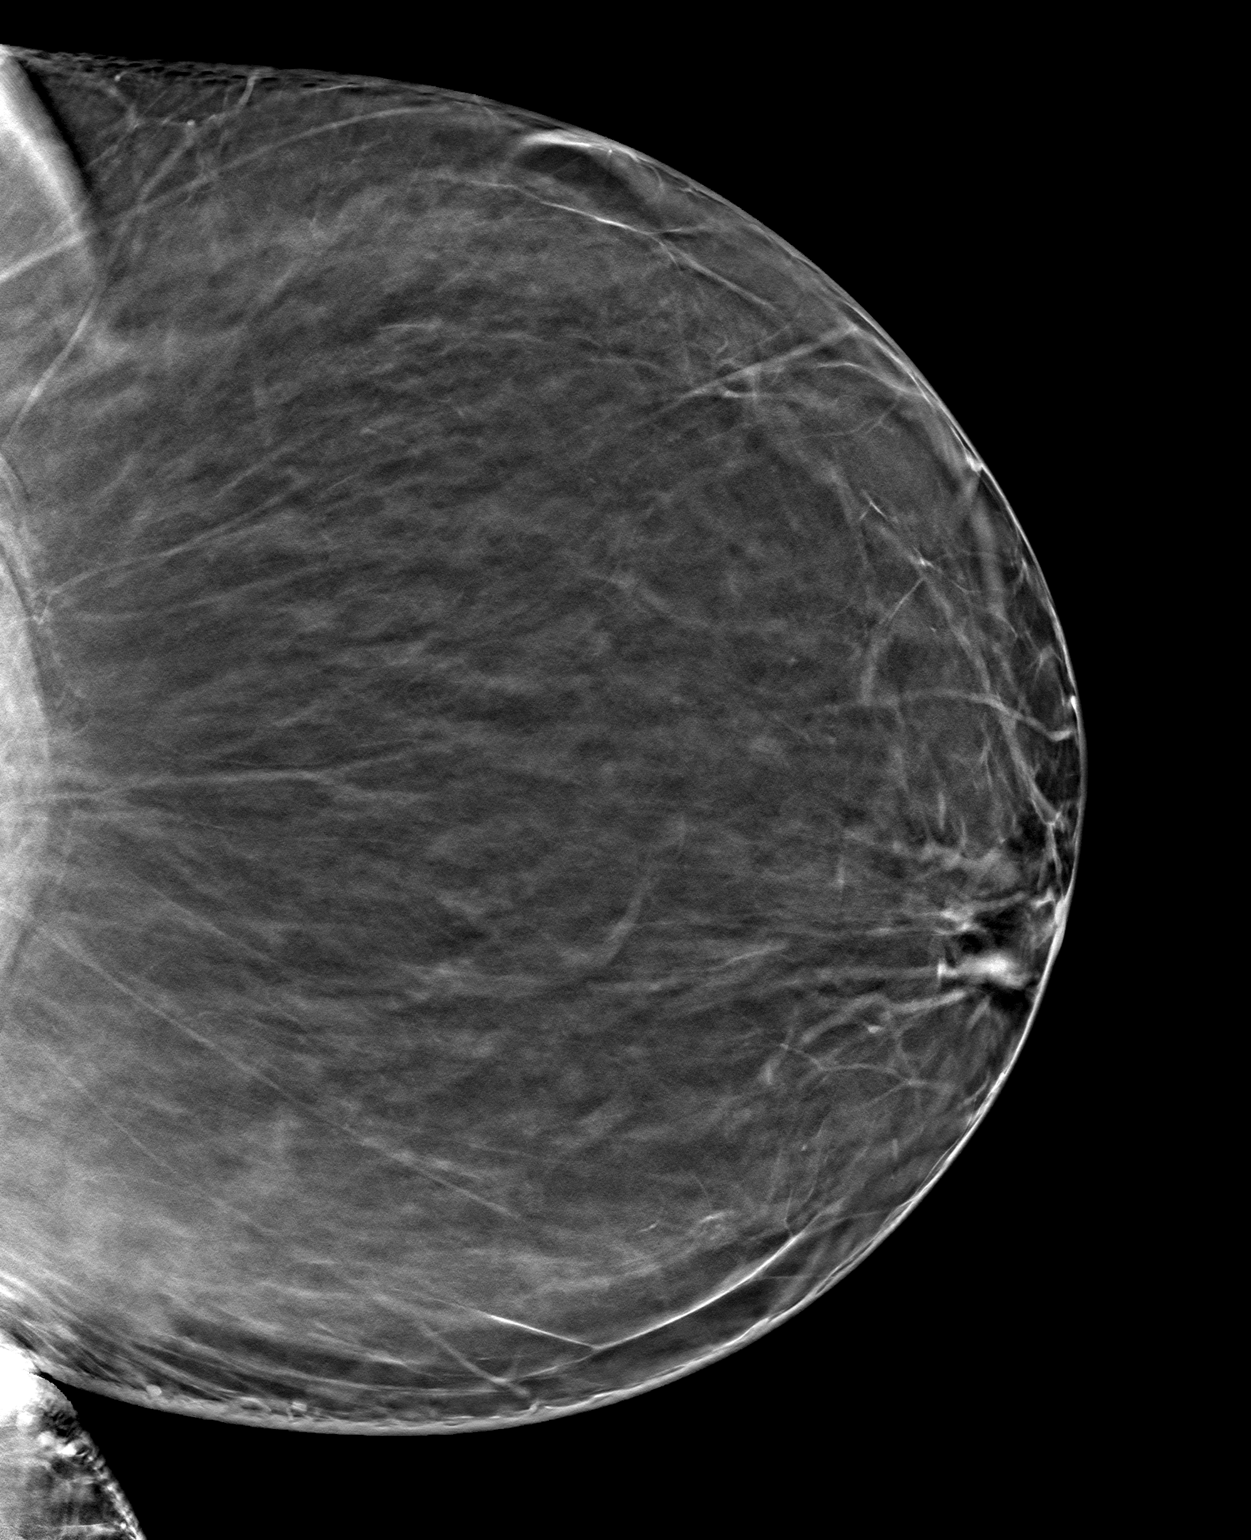
[frame 45/88]
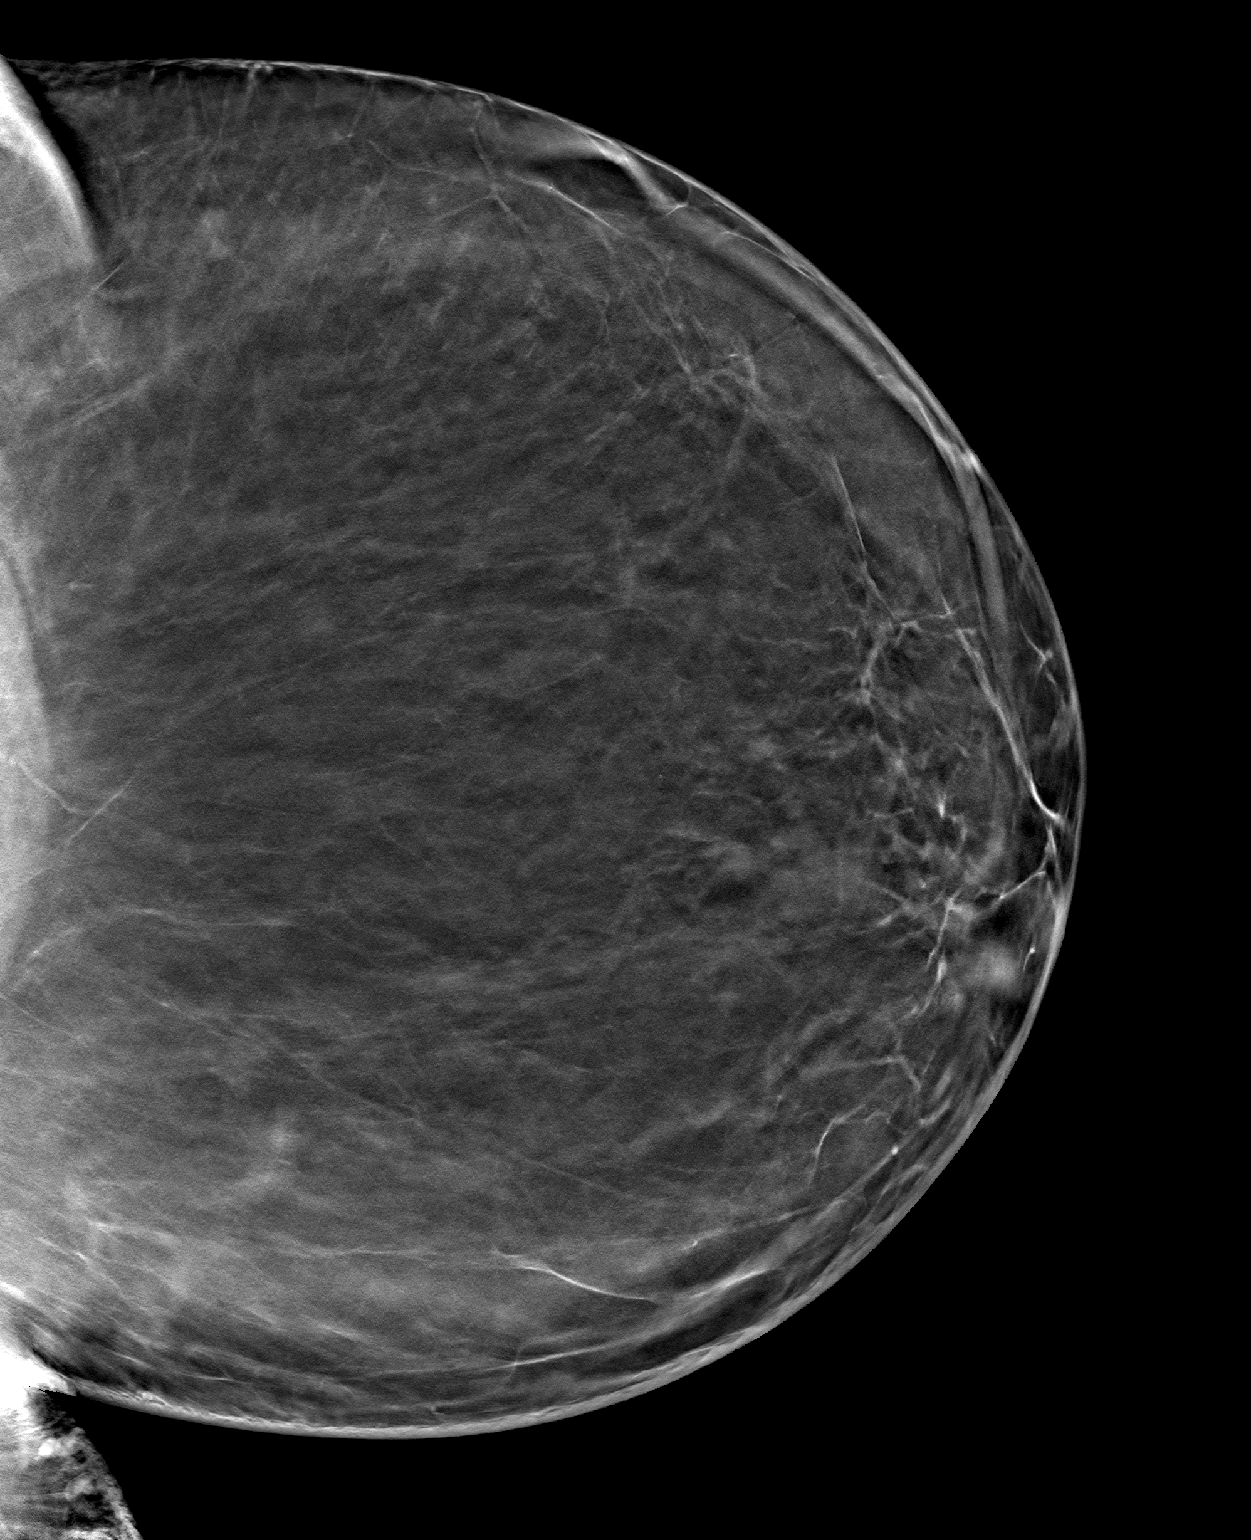

[2 of 5 positions shown; findings below may reference images not displayed]

FINDINGS: There are no findings suspicious for malignancy. Images were
processed with CAD.
IMPRESSION: No mammographic evidence of malignancy. A result letter of this
screening mammogram will be mailed directly to the patient.

RECOMMENDATION:
Screening mammogram in one year. (Code:[id number]-[id number])

BI-RADS CATEGORY  1: Negative.

## 2019-06-04 ENCOUNTER — Encounter (INDEPENDENT_AMBULATORY_CARE_PROVIDER_SITE_OTHER): Payer: Self-pay | Admitting: Family Medicine

## 2019-06-04 ENCOUNTER — Telehealth (INDEPENDENT_AMBULATORY_CARE_PROVIDER_SITE_OTHER): Payer: Self-pay | Admitting: Psychology

## 2019-06-04 ENCOUNTER — Other Ambulatory Visit: Payer: Self-pay

## 2019-06-04 ENCOUNTER — Other Ambulatory Visit (INDEPENDENT_AMBULATORY_CARE_PROVIDER_SITE_OTHER): Payer: Self-pay | Admitting: Family Medicine

## 2019-06-04 ENCOUNTER — Encounter (INDEPENDENT_AMBULATORY_CARE_PROVIDER_SITE_OTHER): Payer: Medicare Other | Admitting: Psychology

## 2019-06-04 ENCOUNTER — Ambulatory Visit (INDEPENDENT_AMBULATORY_CARE_PROVIDER_SITE_OTHER): Payer: Medicare Other | Admitting: Family Medicine

## 2019-06-04 VITALS — BP 130/78 | HR 69 | Temp 98.0°F | Ht 62.0 in | Wt 219.0 lb

## 2019-06-04 DIAGNOSIS — E119 Type 2 diabetes mellitus without complications: Secondary | ICD-10-CM

## 2019-06-04 DIAGNOSIS — I5032 Chronic diastolic (congestive) heart failure: Secondary | ICD-10-CM

## 2019-06-04 DIAGNOSIS — E559 Vitamin D deficiency, unspecified: Secondary | ICD-10-CM

## 2019-06-04 DIAGNOSIS — Z6841 Body Mass Index (BMI) 40.0 and over, adult: Secondary | ICD-10-CM

## 2019-06-04 MED ORDER — JANUMET XR 50-500 MG PO TB24: 1.0000 | ORAL_TABLET | Freq: Every day | ORAL | 0 refills | Status: DC

## 2019-06-04 MED ORDER — VITAMIN D (ERGOCALCIFEROL) 1.25 MG (50000 UNIT) PO CAPS: 50000.0000 [IU] | ORAL_CAPSULE | ORAL | 0 refills | Status: DC

## 2019-06-04 NOTE — Progress Notes (Signed)
Chief Complaint:   OBESITY [name] is here to discuss her progress with her obesity treatment plan along with follow-up of her obesity related diagnoses. [name] is on the Category 2 Plan and states she is following her eating plan approximately 80% of the time. [name] states she is walking and doing leg exercises 20 to 30 minutes 3 times per week.  Today's visit was #: 23 Starting weight: 225 lbs Starting date: 11/20/2017 Today's weight: 219 lbs Today's date: 06/04/2019 Total lbs lost to date: 6 Total lbs lost since last in-office visit: 1  Interim History: [name] is eating out less. She is getting her protein in. She admits to indulging in some sweets over the last few weeks. She made a chocolate cake and had two pieces. She likes to bake but mostly gives the baked goods away.[name] is skipping fewer meals. She sometimes has a protein shake rather than a meal.  Subjective:   Vitamin D deficiency Tane's last Vitamin D level was not at goal (41.8 on 12/26/18). She is on prescription vit D weekly.   Type 2 diabetes mellitus without complication, without long-term current use of insulin ([place]) DM is well controlled with Janumet. Her last A1c was 7 at her PCP 1 to 2 months ago. Tracia's fasting CBGs range between 90 and 140. She has  hypoglycemia sometimes when she does not eat regularly.  Lab Results  Component Value Date   HGBA1C 6.3 (H) 12/26/2018   HGBA1C 6.6 (H) 05/22/2018   HGBA1C 6.8 (H) 03/04/2018   Lab Results  Component Value Date   MICROALBUR 0.51 03/01/2010   LDLCALC 41 12/26/2018   CREATININE 1.00 05/19/2019   Lab Results  Component Value Date   INSULIN 28.1 (H) 12/26/2018   INSULIN 20.2 03/04/2018   INSULIN 53.9 (H) 11/20/2017   Chronic diastolic congestive heart failure ([place]) [name] had a recent ER visit for chest pain. She believes this was related to stress. She was supposed to follow up with cardiology but she has not made an  appointment.  Assessment/Plan:   Vitamin D deficiency Low Vitamin D level contributes to fatigue and are associated with obesity, breast, and colon cancer. [name] agrees to continue to take prescription Vitamin D @50 ,000 IU every week #4 with no refills and we will check vitamin D level today. She will follow-up for routine testing of Vitamin D, at least 2-3 times per year to avoid over-replacement.  Type 2 diabetes mellitus without complication, without long-term current use of insulin (HCC)   [name] agreed to continue Janumet 50-50 once daily #30 with no refills.  Chronic diastolic congestive heart failure (HCC) [name] will make a cardiology appointment.  Class 3 severe obesity with serious comorbidity and body mass index (BMI) of 40.0 to 44.9 in adult, unspecified obesity type (HCC) [name] is currently in the action stage of change. As such, her goal is to continue with weight loss efforts. She has agreed to the Category 2 Plan.   Exercise goals: [name] will continue her current exercise regimen.  Behavioral modification strategies: increasing lean protein intake, decreasing simple carbohydrates and no skipping meals. I encouraged patient to eat a meal rather than substitute a protein shake when possible.  [name] has agreed to follow-up with our clinic in 3 weeks. She was informed of the importance of frequent follow-up visits to maximize her success with intensive lifestyle modifications for her multiple health conditions.   [name] was informed we would discuss her lab results at  her next visit unless there is a critical issue that needs to be addressed sooner. [name] agreed to keep her next visit at the agreed upon time to discuss these results.  Objective:   Blood pressure 130/78, pulse 69, temperature 98 F (36.7 C), temperature source Oral, height 5\' 2"  (1.575 m), weight 219 lb (99.3 kg), SpO2 97 %. Body mass index is 40.06 kg/m.  General: Cooperative, alert, well developed,  in no acute distress. HEENT: Conjunctivae and lids unremarkable. Cardiovascular: Regular rhythm.  Lungs: Normal work of breathing. Neurologic: No focal deficits.   Lab Results  Component Value Date   CREATININE 1.00 05/19/2019   BUN 18 05/19/2019   NA 141 05/19/2019   K 3.8 05/19/2019   CL 104 05/19/2019   CO2 26 05/19/2019   Lab Results  Component Value Date   ALT 25 05/19/2019   AST 23 05/19/2019   ALKPHOS 51 05/19/2019   BILITOT 0.6 05/19/2019   Lab Results  Component Value Date   HGBA1C 6.3 (H) 12/26/2018   HGBA1C 6.6 (H) 05/22/2018   HGBA1C 6.8 (H) 03/04/2018   HGBA1C 8.8 (H) 11/20/2017   HGBA1C 7.4 (H) 08/11/2016   Lab Results  Component Value Date   INSULIN 28.1 (H) 12/26/2018   INSULIN 20.2 03/04/2018   INSULIN 53.9 (H) 11/20/2017   Lab Results  Component Value Date   TSH 0.458 03/05/2019   Lab Results  Component Value Date   CHOL 119 12/26/2018   HDL 58 12/26/2018   LDLCALC 41 12/26/2018   TRIG 108 12/26/2018   CHOLHDL 4.2 04/23/2011   CHOLHDL 4.3 04/23/2011   Lab Results  Component Value Date   WBC 6.4 05/19/2019   HGB 12.5 05/19/2019   HCT 38.7 05/19/2019   MCV 88.2 05/19/2019   PLT 208 05/19/2019   No results found for: IRON, TIBC, FERRITIN  Obesity Behavioral Intervention Documentation for Insurance:   Approximately 15 minutes were spent on the discussion below.  ASK: We discussed the diagnosis of obesity with [name] today and [name] agreed to give [name] permission to discuss obesity behavioral modification therapy today.  ASSESS: [name] has the diagnosis of obesity and her BMI today is 40.05. [name] is in the action stage of change.   ADVISE: [name] was educated on the multiple health risks of obesity as well as the benefit of weight loss to improve her health. She was advised of the need for long term treatment and the importance of lifestyle modifications to improve her current health and to decrease her risk of future health  problems.  AGREE: Multiple dietary modification options and treatment options were discussed and [name] agreed to follow the recommendations documented in the above note.  ARRANGE: [name] was educated on the importance of frequent visits to treat obesity as outlined per CMS and USPSTF guidelines and agreed to schedule her next follow up appointment today.  Attestation Statements:   Reviewed by clinician on day of visit: allergies, medications, problem list, medical history, surgical history, family history, social history, and previous encounter notes.  Corey Skains, am acting as Location manager for [employer], FNP-C.  I have reviewed the above documentation for accuracy and completeness, and I agree with the above. -  Calleigh Lafontant [employer], FNP-C

## 2019-06-04 NOTE — Telephone Encounter (Signed)
  Office: [phone]  /  Fax: [phone]  Date of Call: June 04, 2019  Time of Call: 8:02am Duration of Call: 5 minutes Provider: Glennie Isle, PsyD  CONTENT: This provider called Recia to check-in as she did not present for today's Webex appointment at [place] explained she presented to the clinic as she thought she was also scheduled for lab work. She noted she was in the car at the time of the call, but stated her phone was about to shut off as it was not charged last night and she did not have a Games developer with her. Thus, today's appointment was rescheduled. Deresa denied experiencing suicidal ideation, plan, or intent since the last appointment with this provider.   PLAN: Gaylynn is scheduled for an appointment on June 05, 2019 at 2:30pm via Webex.

## 2019-06-04 NOTE — Progress Notes (Signed)
Office: [phone]  /  Fax: [phone]    Date: June 05, 2019   Appointment Start Time: 2:37pm Duration: 23 minutes Provider: Glennie Isle, Psy.D. Type of Session: Individual Therapy  Location of Patient: Parked in car Location of Provider: Provider's Home Type of Contact: Telepsychological Visit via [place] Telephone call   Session Content: This provider called [name] at 2:32pm as she did not present for the WebEx appointment. She indicated she forgot to sign on early as previously discussed, noting she would join. This provider called her again at 2:36pm as [name] did not present on Webex. The e-mail with the secure link was re-sent. As such, today's appointment was initiated 7 minutes late.  [name] is a 59 y.o. female presenting via [address] for a follow-up appointment to address the previously established treatment goal of decreasing emotional eating. Today's appointment was a telepsychological visit due to COVID-19. [name] provided verbal consent for today's telepsychological appointment and she is aware she is responsible for securing confidentiality on her end of the session. Prior to proceeding with today's appointment, [name]'s physical location at the time of this appointment was obtained as well a phone number she could be reached at in the event of technical difficulties. [name] and this provider participated in today's telepsychological service. Of note, today's appointment was switched to a regular phone call at 2:52pm due to audio issues on [name]'s end.    This provider conducted a brief check-in. [name] reported she was previously not eating congruent to the meal plan. Her recent eating habits were explored. [name] reported she is now focusing on protein intake, but acknowledged skipping meals at times, especially when she is out. As such, this provider discussed taking snacks on the go; she agreed. Psychoeducation regarding emotional versus physical hunger was  provided. [name] was given a handout to utilize between now and the next appointment to increase awareness of hunger patterns and subsequent eating. [name] provided verbal consent during today's appointment for this provider to send a handout about hunger patterns via e-mail. Moreover, she discussed recently experiencing sleep issues and decreased motivation, adding focusing on self-care has helped. [name] noted a plan to walk in the water at the [facility]. Positive reinforcement was provided. [name] reported she has an appointment on June 11, 2019 with her PCP, noting she learned there is no therapist at her PCP's office. As such, she indicated a plan to reach out to her old therapist with [facility]. Overall, [name] was receptive to today's appointment as evidenced by openness to sharing, responsiveness to feedback, and willingness to discuss hunger patterns.  Mental Status Examination:  Appearance: well groomed and appropriate hygiene  Behavior: appropriate to circumstances Mood: euthymic Affect: mood congruent Speech: normal in rate, volume, and tone Eye Contact: appropriate Psychomotor Activity: appropriate Gait: unable to assess Thought Process: linear, logical, and goal directed  Thought Content/Perception: denies suicidal and homicidal ideation, plan, and intent and no hallucinations, delusions, bizarre thinking or behavior reported or observed Orientation: time, person, place and purpose of appointment Memory/Concentration: memory, attention, language, and fund of knowledge intact  Insight/Judgment: good  Interventions:  Conducted a brief chart review Conducted a risk assessment Provided empathic reflections and validation Employed supportive psychotherapy interventions to facilitate reduced distress and to improve coping skills with identified stressors Employed motivational interviewing skills to assess patient's willingness/desire to adhere to recommended medical treatments and  assignments Psychoeducation provided regarding physical versus emotional hunger  DSM-5 Diagnosis: 296.31 (F33.0) Major Depressive Disorder, Recurrent Episode, Mild, With  Anxious Distress  Treatment Goal & Progress: During the initial appointment with this provider, the following treatment goal was established: decrease emotional eating. Progress is limited, as [name] has just begun treatment with this provider; however, she is receptive to the interaction and interventions and rapport is being established.   Plan: The next appointment will be scheduled in approximately two weeks, which will be via [employer]. The next session will focus on working towards the established treatment goal.

## 2019-06-04 NOTE — Progress Notes (Signed)
Entered in error

## 2019-06-05 ENCOUNTER — Ambulatory Visit (INDEPENDENT_AMBULATORY_CARE_PROVIDER_SITE_OTHER): Payer: Medicare Other | Admitting: Psychology

## 2019-06-05 DIAGNOSIS — F330 Major depressive disorder, recurrent, mild: Secondary | ICD-10-CM

## 2019-06-05 LAB — VITAMIN D 25 HYDROXY (VIT D DEFICIENCY, FRACTURES): Vit D, 25-Hydroxy: 36.9 ng/mL (ref 30.0–100.0)

## 2019-06-10 NOTE — Progress Notes (Unsigned)
Office: [phone]  /  Fax: [phone]    Date: June 24, 2019   Appointment Start Time: *** Duration: *** minutes Provider: Glennie Isle, Psy.D. Type of Session: Individual Therapy  Location of Patient: {gbptloc:23249} Location of Provider: Provider's Home Type of Contact: Telepsychological Visit via {gbtelepsych:23399}  Session Content: This provider called [name] at 4:02pm as she did not present for the WebEx appointment. A HIPAA compliant voicemail was left requesting a call back. The e-mail with the secure link was re-sent. As such, today's appointment was initiated *** minutes late.  [name] is a 59 y.o. female presenting via {gbtelepsych:23399} for a follow-up appointment to address the previously established treatment goal of decreasing emotional eating. Today's appointment was a telepsychological visit due to COVID-19. [name] provided verbal consent for today's telepsychological appointment and she is aware she is responsible for securing confidentiality on her end of the session. Prior to proceeding with today's appointment, Valeska's physical location at the time of this appointment was obtained as well a phone number she could be reached at in the event of technical difficulties. Sarisha and this provider participated in today's telepsychological service.   This provider conducted a brief check-in and verbally administered the PHQ-9 and GAD-7. *** [name] was receptive to today's appointment as evidenced by openness to sharing, responsiveness to feedback, and {gbreceptiveness:23401}.  Mental Status Examination:  Appearance: {Appearance:22431} Behavior: {Behavior:22445} Mood: {gbmood:21757} Affect: {Affect:22436} Speech: {Speech:22432} Eye Contact: {Eye Contact:22433} Psychomotor Activity: {Motor Activity:22434} Gait: {gbgait:23404} Thought Process: {thought process:22448}  Thought Content/Perception: {disturbances:22451} Orientation:  {Orientation:22437} Memory/Concentration: {gbcognition:22449} Insight/Judgment: {Insight:22446}  Structured Assessments Results: The Patient Health Questionnaire-9 (PHQ-9) is a self-report measure that assesses symptoms and severity of depression over the course of the last two weeks. [name] obtained a score of *** suggesting {GBPHQ9SEVERITY:21752}. [name] finds the endorsed symptoms to be {gbphq9difficulty:21754}. [0= Not at all; 1= Several days; 2= More than half the days; 3= Nearly every day] Little interest or pleasure in doing things ***  Feeling down, depressed, or hopeless ***  Trouble falling or staying asleep, or sleeping too much ***  Feeling tired or having little energy ***  Poor appetite or overeating ***  Feeling bad about yourself --- or that you are a failure or have let yourself or your family down ***  Trouble concentrating on things, such as reading the newspaper or watching television ***  Moving or speaking so slowly that other people could have noticed? Or the opposite --- being so fidgety or restless that you have been moving around a lot more than usual ***  Thoughts that you would be better off dead or hurting yourself in some way ***  PHQ-9 Score ***    The Generalized Anxiety Disorder-7 (GAD-7) is a brief self-report measure that assesses symptoms of anxiety over the course of the last two weeks. [name] obtained a score of *** suggesting {gbgad7severity:21753}. [name] finds the endorsed symptoms to be {gbphq9difficulty:21754}. [0= Not at all; 1= Several days; 2= Over half the days; 3= Nearly every day] Feeling nervous, anxious, on edge ***  Not being able to stop or control worrying ***  Worrying too much about different things ***  Trouble relaxing ***  Being so restless that it's hard to sit still ***  Becoming easily annoyed or irritable ***  Feeling afraid as if something awful might happen ***  GAD-7 Score ***   Interventions:  {Interventions for Progress  Notes:23405}  DSM-5 Diagnosis: 296.31 (F33.0) Major Depressive Disorder, Recurrent Episode, Mild, With Anxious Distress  Treatment Goal &  Progress: During the initial appointment with this provider, the following treatment goal was established: decrease emotional eating. [name] has demonstrated progress in her goal as evidenced by {gbtxprogress:22839}. [name] also {gbtxprogress2:22951}.  Plan: The next appointment will be scheduled in {gbweeks:21758}, which will be {gbtxmodality:23402}. The next session will focus on {Plan for Next Appointment:23400}.

## 2019-06-20 IMAGING — DX DG HIP (WITH OR WITHOUT PELVIS) 2-3V*L*
3 series · 3 of 3 positions shown · non-contrast
Comparison: None.

CLINICAL DATA: Left lower leg pain radiating into the groin. No
injury.

EXAM:
DG HIP (WITH OR WITHOUT PELVIS) 2-3V LEFT

[pelvis ap]
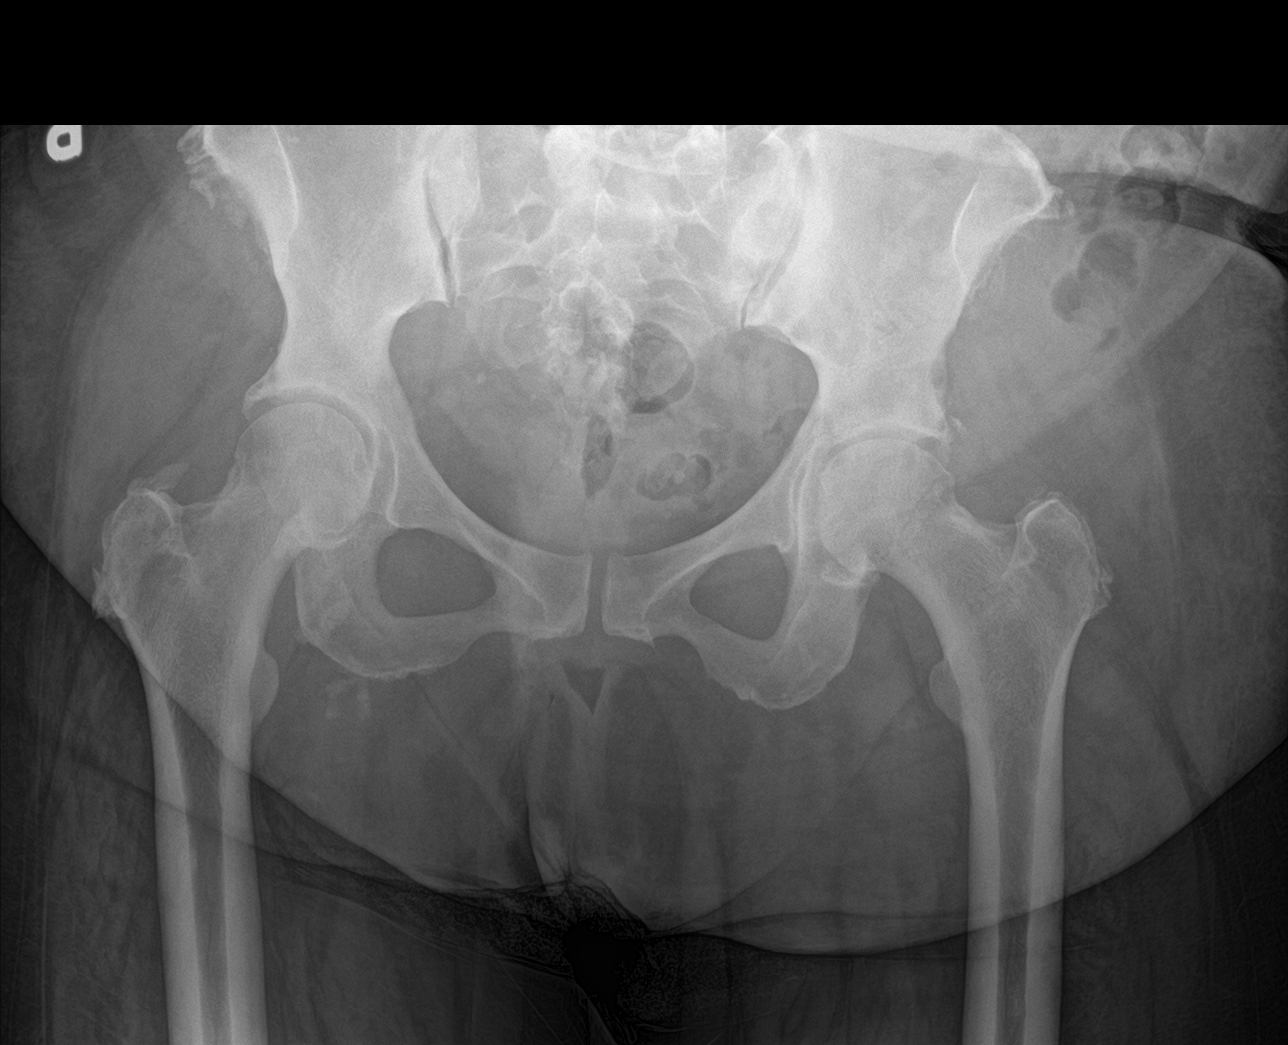

[hip ap]
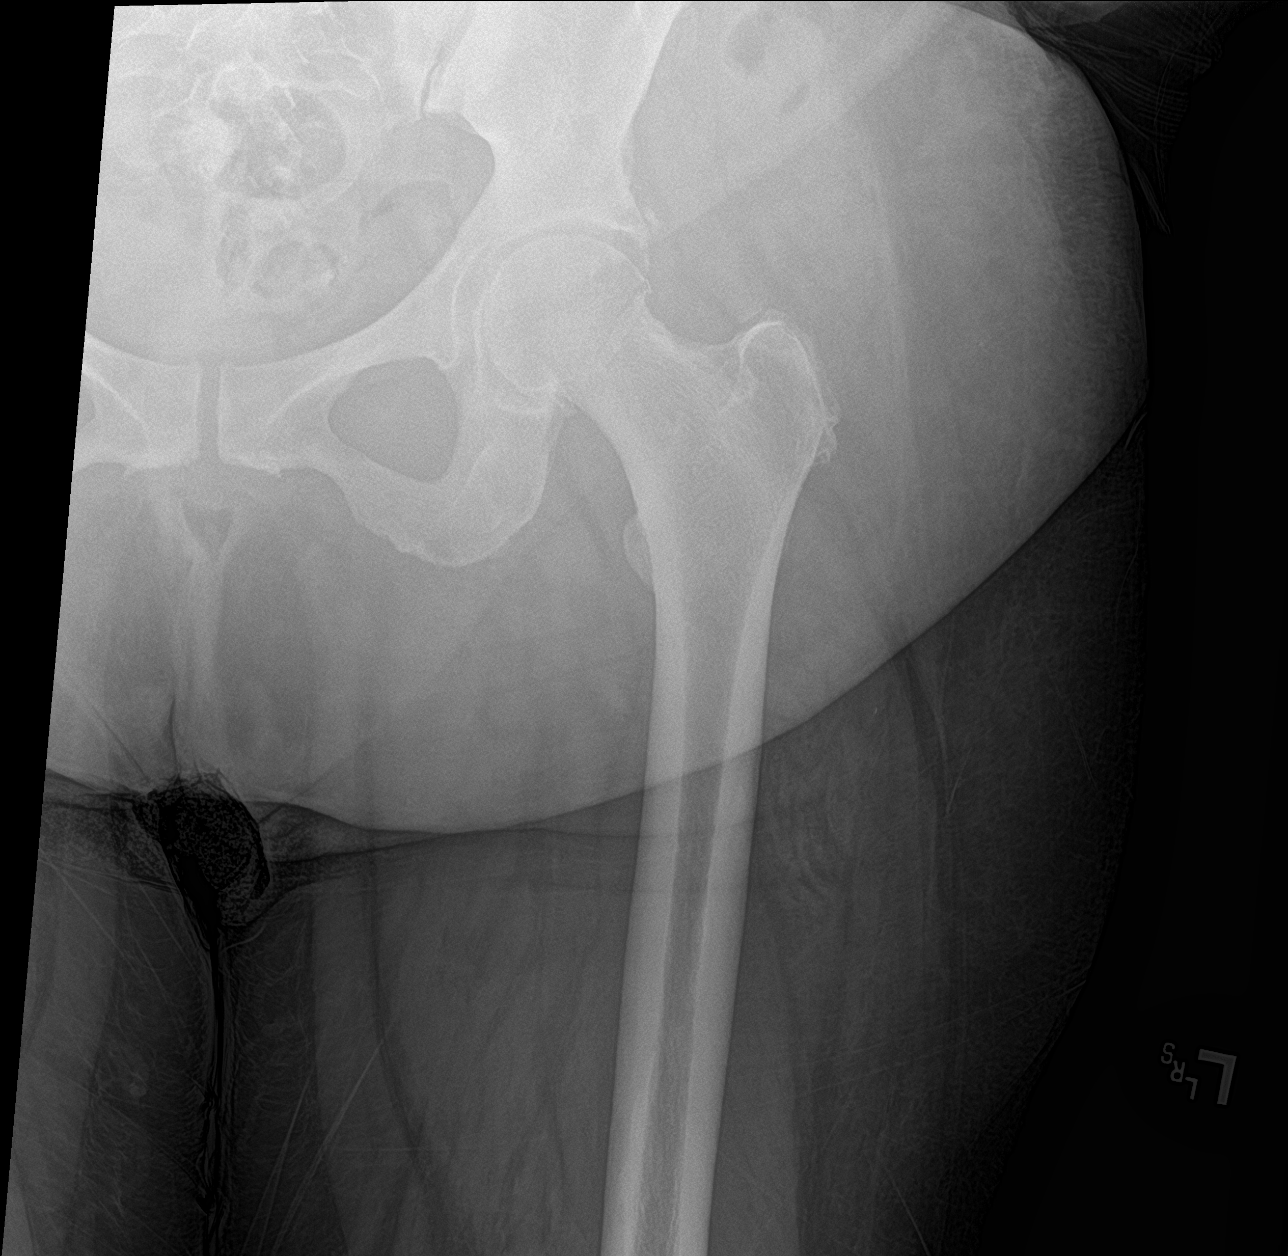

[hip lat]
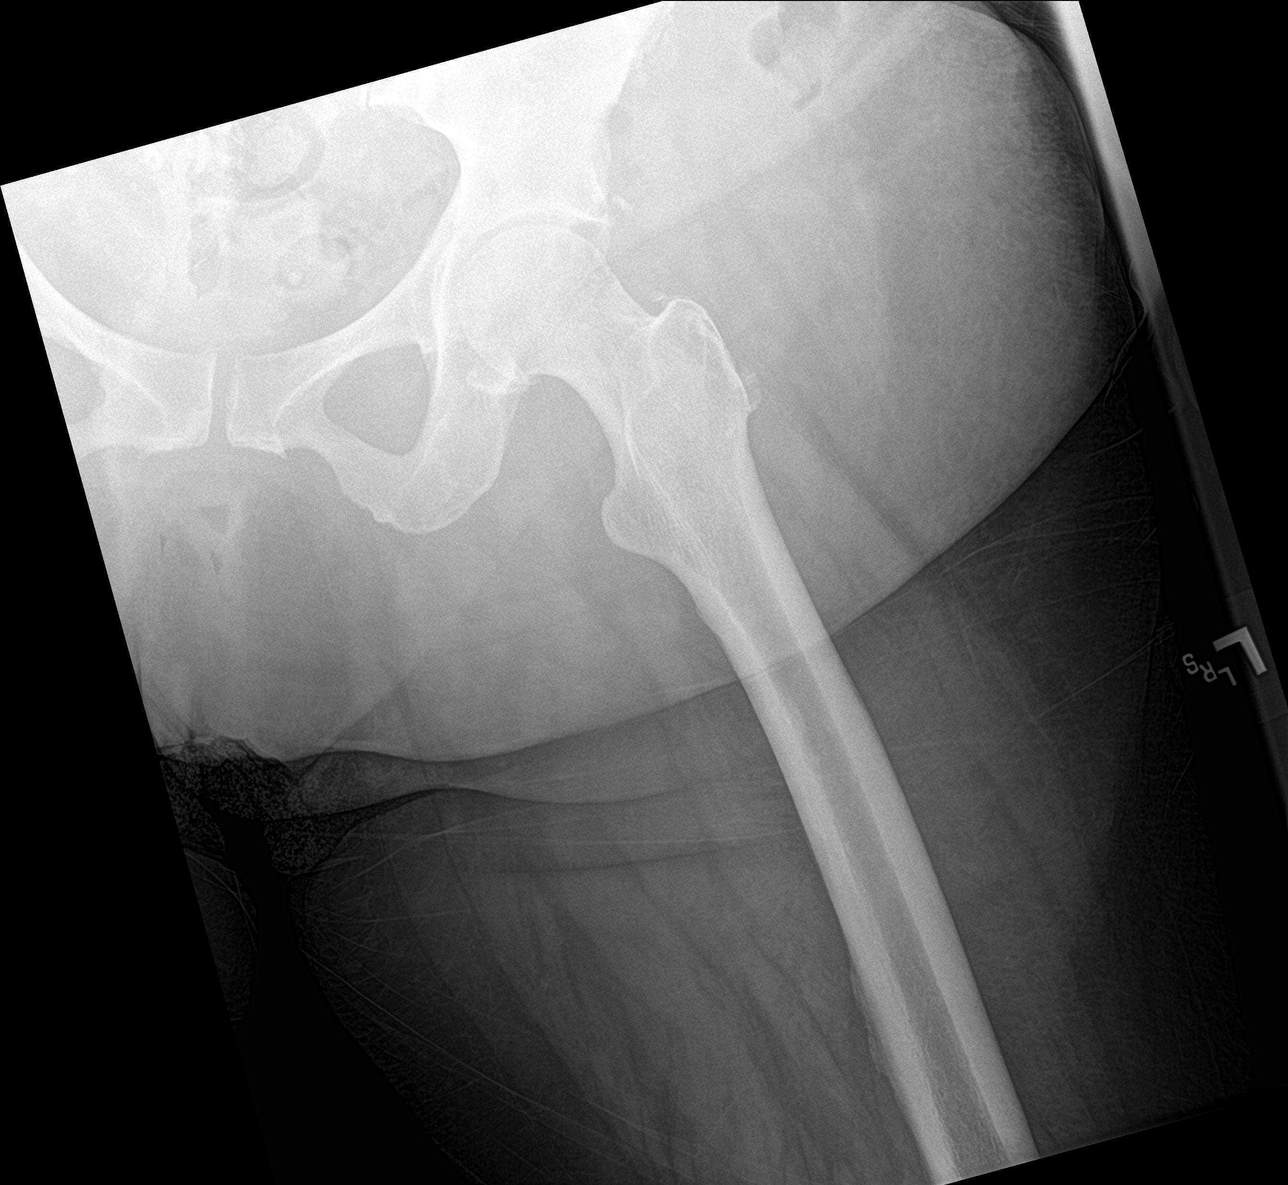

[3 of 3 positions shown; findings below may reference images not displayed]

FINDINGS: Symmetrical degenerative changes in both hips with narrowed joint
space and osteophytes on both sides of the joint. No evidence of
acute fracture or dislocation involving the pelvis or left hip. No
focal bone lesion or bone destruction. Sacrum appears intact. SI
joints and symphysis pubis are not displaced. Soft tissues are
unremarkable.
IMPRESSION: Degenerative changes in the hips.  No acute bony abnormalities.

## 2019-06-21 ENCOUNTER — Emergency Department (HOSPITAL_COMMUNITY)
Admission: EM | Admit: 2019-06-21 | Discharge: 2019-06-21 | Disposition: A | Discharge status: Home / Self Care | Payer: Medicare Other | Attending: Emergency Medicine | Admitting: Emergency Medicine

## 2019-06-21 ENCOUNTER — Emergency Department (HOSPITAL_COMMUNITY): Payer: Medicare Other

## 2019-06-21 ENCOUNTER — Other Ambulatory Visit: Payer: Self-pay

## 2019-06-21 ENCOUNTER — Encounter (HOSPITAL_COMMUNITY): Payer: Self-pay | Admitting: *Deleted

## 2019-06-21 DIAGNOSIS — M25551 Pain in right hip: Secondary | ICD-10-CM | POA: Diagnosis present

## 2019-06-21 DIAGNOSIS — Z79899 Other long term (current) drug therapy: Secondary | ICD-10-CM | POA: Diagnosis not present

## 2019-06-21 DIAGNOSIS — Z91040 Latex allergy status: Secondary | ICD-10-CM | POA: Insufficient documentation

## 2019-06-21 DIAGNOSIS — Z96652 Presence of left artificial knee joint: Secondary | ICD-10-CM | POA: Insufficient documentation

## 2019-06-21 DIAGNOSIS — Z85830 Personal history of malignant neoplasm of bone: Secondary | ICD-10-CM | POA: Diagnosis not present

## 2019-06-21 DIAGNOSIS — I110 Hypertensive heart disease with heart failure: Secondary | ICD-10-CM | POA: Diagnosis not present

## 2019-06-21 DIAGNOSIS — Z87891 Personal history of nicotine dependence: Secondary | ICD-10-CM | POA: Insufficient documentation

## 2019-06-21 DIAGNOSIS — I5030 Unspecified diastolic (congestive) heart failure: Secondary | ICD-10-CM | POA: Diagnosis not present

## 2019-06-21 DIAGNOSIS — M25552 Pain in left hip: Secondary | ICD-10-CM | POA: Diagnosis not present

## 2019-06-21 DIAGNOSIS — J45909 Unspecified asthma, uncomplicated: Secondary | ICD-10-CM | POA: Insufficient documentation

## 2019-06-21 DIAGNOSIS — W010XXA Fall on same level from slipping, tripping and stumbling without subsequent striking against object, initial encounter: Secondary | ICD-10-CM | POA: Diagnosis not present

## 2019-06-21 DIAGNOSIS — Z96642 Presence of left artificial hip joint: Secondary | ICD-10-CM | POA: Insufficient documentation

## 2019-06-21 DIAGNOSIS — Z7982 Long term (current) use of aspirin: Secondary | ICD-10-CM | POA: Insufficient documentation

## 2019-06-21 DIAGNOSIS — Z7984 Long term (current) use of oral hypoglycemic drugs: Secondary | ICD-10-CM | POA: Insufficient documentation

## 2019-06-21 DIAGNOSIS — E119 Type 2 diabetes mellitus without complications: Secondary | ICD-10-CM | POA: Insufficient documentation

## 2019-06-21 DIAGNOSIS — Z8673 Personal history of transient ischemic attack (TIA), and cerebral infarction without residual deficits: Secondary | ICD-10-CM | POA: Diagnosis not present

## 2019-06-21 DIAGNOSIS — I509 Heart failure, unspecified: Secondary | ICD-10-CM

## 2019-06-21 LAB — CBC
HCT: 38.1 % (ref 36.0–46.0)
Hemoglobin: 12.1 g/dL (ref 12.0–15.0)
MCH: 27.9 pg (ref 26.0–34.0)
MCHC: 31.8 g/dL (ref 30.0–36.0)
MCV: 88 fL (ref 80.0–100.0)
Platelets: 235 10*3/uL (ref 150–400)
RBC: 4.33 MIL/uL (ref 3.87–5.11)
RDW: 13.2 % (ref 11.5–15.5)
WBC: 5.7 10*3/uL (ref 4.0–10.5)
nRBC: 0 % (ref 0.0–0.2)

## 2019-06-21 LAB — BASIC METABOLIC PANEL
Anion gap: 7 (ref 5–15)
BUN: 13 mg/dL (ref 6–20)
CO2: 29 mmol/L (ref 22–32)
Calcium: 8.9 mg/dL (ref 8.9–10.3)
Chloride: 106 mmol/L (ref 98–111)
Creatinine, Ser: 0.84 mg/dL (ref 0.44–1.00)
GFR calc Af Amer: >60 mL/min (ref >60)
GFR calc non Af Amer: >60 mL/min (ref >60)
Glucose, Bld: 137 mg/dL — ABNORMAL HIGH (ref 70–99)
Potassium: 3.9 mmol/L (ref 3.5–5.1)
Sodium: 142 mmol/L (ref 135–145)

## 2019-06-21 LAB — TROPONIN I (HIGH SENSITIVITY): Troponin I (High Sensitivity): 9 ng/L (ref <18)

## 2019-06-21 LAB — BRAIN NATRIURETIC PEPTIDE: B Natriuretic Peptide: 23.5 pg/mL (ref 0.0–100.0)

## 2019-06-21 MED ORDER — FUROSEMIDE 10 MG/ML IJ SOLN
60.0000 mg | Freq: Once | INTRAMUSCULAR | Status: AC
  Administered 2019-06-21: 60 mg via INTRAVENOUS
  Filled 2019-06-21: qty 6

## 2019-06-21 MED ORDER — SODIUM CHLORIDE 0.9% FLUSH
3.0000 mL | Freq: Once | INTRAVENOUS | Status: AC
  Administered 2019-06-21: 3 mL via INTRAVENOUS

## 2019-06-21 MED ORDER — ACETAMINOPHEN 500 MG PO TABS
500.0000 mg | ORAL_TABLET | Freq: Once | ORAL | Status: AC
  Administered 2019-06-21: 500 mg via ORAL
  Filled 2019-06-21: qty 1

## 2019-06-21 NOTE — ED Notes (Signed)
Patient verbalizes understanding of discharge instructions. Opportunity for questioning and answers were provided. Armband removed by staff, pt discharged from ED. Pt. ambulatory and discharged home.  

## 2019-06-21 NOTE — ED Triage Notes (Signed)
The pt is complaining of sob  And weakness since Monday  She fell Monday also  No acute respiratory distress

## 2019-06-21 NOTE — ED Provider Notes (Signed)
[facility] EMERGENCY DEPARTMENT Provider Note   CSN: [id number] Arrival date & time: 06/21/19  1850     History Chief Complaint  Patient presents with  . Shortness of Breath   [name] is a 59 y.o. female.  59 year old female with extensive medical history presents with seemingly 2 unrelated problems.  Mechanical fall 5 days ago while walking on wet and slippery sidewalk.  Patient endorses feet slipping out from under her and fell on top of her right leg bent under her body and caught herself on the railing partially.  Did not notice any pop, crack sensation with fall.  Pain is symmetric bilateral hips and radiates down to her knees and ankles.  Left is slightly worse pain and right has more edema of the ankle.  No numbness or tingling but does endorse some weakness.  No loss of bowel or bladder control.  Patient has h/o left-sided hip and knee arthroplasty.  Hip replaced approximately 1 year ago and has been recovered with just occasional cane use when ambulating.  Has continuously been using a cane since the fall but was able to ambulate with pain since.  Thought that the pain was improving until today when started getting worse again.  Additionally, patient endorses sensation of dyspnea worse with exertion since yesterday afternoon which is worsening today.  Endorses some difficulties taking deep breaths.  Increased swelling in ankles bilaterally but worse on right.  Dry weight 220 pounds and was 224 pounds this morning.  Takes her 60 mg Lasix daily and had last dose at noon today.  Has not used her rescue inhaler.  Chest x-ray positive for cardiomegaly with signs of vascular congestion.    Past Medical History:  Diagnosis Date  . Anginal pain ([place])    a. NL cath in 2008;  b. Myoview 03/2011: dec uptake along mid anterior wall on stress imaging -> ? attenuation vs. ischemia, EF 65%;  c. Echo 04/2011: EF 55-60%, no RWMA, Gr 2 dd  . Anxiety   . Arthritis   .  Asthma   . Back pain   . Bone cancer ([place])   . Cancer ([place])   . Chest pain   . CHF (congestive heart failure) ([place])   . CHF (congestive heart failure) ([place])   . Depression   . Diabetes mellitus   . Drug use   . Dyspnea    with exertion  . Dyspnea   . Frequent urination   . GERD (gastroesophageal reflux disease)   . Glaucoma   . HLD (hyperlipidemia)   . Hypertension   . IBS (irritable bowel syndrome)   . Joint pain   . Lactose intolerance   . Leg edema   . 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 Obesity   . Palpitations   . Pneumonia 05/2016   double  . Pulmonary edema   . Pulmonary embolism ([place])    a. 2008 -> coumadin x 6 mos.  . Rheumatoid arthritis ([place])   . Sleep apnea    on CPAP 02/2018  . TIA (transient ischemic attack)   . Urinary urgency    Patient Active Problem List   Diagnosis Date Noted  . Unilateral primary osteoarthritis, left hip   . Hip arthritis 06/11/2018  . Class 3 severe obesity with serious comorbidity and body mass index (BMI) of 40.0 to 44.9 in adult ([place]) 05/29/2018  . Other fatigue 11/20/2017  . Shortness of breath  on exertion 11/20/2017  . Type 2 diabetes mellitus without complication, without long-term current use of insulin ([place]) 11/20/2017  . Vitamin D deficiency 11/20/2017  . Depression 11/20/2017  . Other hyperlipidemia 11/20/2017  . 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 Presence of left artificial knee joint 09/20/2016  . Arthritis of knee 08/22/2016  . Cervical radiculopathy 07/26/2016  . Cervical disc disorder with radiculopathy 07/26/2016  . Right arm pain 06/29/2016  . Cervicalgia 06/29/2016  . Primary osteoarthritis of left knee 06/29/2016  . Hypersomnia with sleep apnea 11/18/2015  . Lethargy 11/18/2015  . Chest pain 09/30/2014  . Abnormal cardiac function test 09/29/2014  . Chest pain with moderate risk for cardiac etiology 09/28/2014  . HTN (hypertension) 09/28/2014  . Hypokalemia   . Paresthesia  06/18/2014  . Numbness and tingling of left arm and leg   . Unstable angina ([place]) 05/24/2014  . OSA on CPAP 05/24/2014  . Diabetes mellitus type 2 in obese ([place]) 10/29/2012  . S/P laparoscopic appendectomy 06/04/2012  . Diastolic CHF ([place]) [id number]  . Mediastinal mass 01/09/2012  . SOB (shortness of breath) 06/20/2011  . Mediastinal abnormality 06/20/2011  . Dyslipidemia 12/23/2009  . GLAUCOMA 12/23/2009  . ARTHRITIS 12/23/2009  . 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 Class 2 severe obesity with serious comorbidity and body mass index (BMI) of 38.0 to 38.9 in adult ([place]) 09/13/2006  . ANXIETY STATE NOS 09/13/2006  . DISORDER, DEPRESSIVE NEC 09/13/2006  . CARPAL TUNNEL SYNDROME, MILD 09/13/2006  . Unspecified essential hypertension 09/13/2006  . IBS 09/13/2006  . DEGENERATION, LUMBAR/LUMBOSACRAL DISC 09/13/2006  . SYMPTOM, SWELLING/MASS/LUMP IN CHEST 09/13/2006  . PULMONARY EMBOLISM, HX OF 09/13/2006   Past Surgical History:  Procedure Laterality Date  . ABDOMINAL HYSTERECTOMY  2005  . APPENDECTOMY    . CARDIAC CATHETERIZATION     Normal  . CARDIAC CATHETERIZATION N/A 09/30/2014   Procedure: Left Heart Cath and Coronary Angiography;  Surgeon: Sherren Mocha, MD;  Location: [facility];  Service: Cardiovascular;  Laterality: N/A;  . LAPAROSCOPIC APPENDECTOMY N/A 06/03/2012   Procedure: APPENDECTOMY LAPAROSCOPIC;  Surgeon: Stark Klein, MD;  Location: [facility];  Service: General;  Laterality: N/A;  . Left knee surgery  2008  . LEG SURGERY    . TONSILLECTOMY    . TOTAL HIP ARTHROPLASTY Left 06/11/2018   Procedure: LEFT TOTAL HIP ARTHROPLASTY ANTERIOR APPROACH;  Surgeon: Meredith Pel, MD;  Location: [facility];  Service: Orthopedics;  Laterality: Left;  . TOTAL KNEE ARTHROPLASTY Left 08/22/2016   Procedure: TOTAL KNEE ARTHROPLASTY;  Surgeon: Meredith Pel, MD;  Location: [facility];  Service: Orthopedics;  Laterality: Left;  . TUBAL LIGATION  1989    OB History    Gravida  3    Para      Term      Preterm      AB      Living        SAB      TAB      Ectopic      Multiple      Live Births              Family History  Problem Relation Age of Onset  . Emphysema Mother   . Arthritis Mother   . Heart failure Mother        alive @ 9  . Stroke Mother   . Diabetes Mother   . Hypertension Mother   . Hyperlipidemia Mother   . Depression Mother   .  Anxiety disorder Mother   . Asthma Brother   . Heart disease Father        died @ 35's.  . Stroke Father   . Diabetes Father   . Hyperlipidemia Father   . Hypertension Father   . Bipolar disorder Father   . Sleep apnea Father   . Alcoholism Father   . Drug abuse Father   . Diabetes Sister   . Heart disease Paternal Grandfather   . Colon cancer Maternal Grandfather   . Breast cancer Maternal Aunt     Social History   Tobacco Use  . Smoking status: Former Smoker    Packs/day: 0.50    Years: 15.00    Pack years: 7.50    Types: Cigarettes    Quit date: 04/24/1985    Years since quitting: 34.1  . Smokeless tobacco: Never Used  Substance Use Topics  . Alcohol use: No    Alcohol/week: 0.0 standard drinks  . Drug use: Not Currently    Types: Marijuana    Home Medications Prior to Admission medications   Medication Sig Start Date End Date Taking? Authorizing Provider  albuterol (PROVENTIL HFA;VENTOLIN HFA) 108 (90 Base) MCG/ACT inhaler Inhale 2 puffs into the lungs every 6 (six) hours as needed for wheezing or shortness of breath.    [provider]  aspirin 81 MG chewable tablet Chew 81 mg by mouth daily.    [provider]  atorvastatin (LIPITOR) 40 MG tablet Take 40 mg by mouth at bedtime.     [provider]  busPIRone (BUSPAR) 15 MG tablet Take 15 mg by mouth 2 (two) times daily. 02/25/19   [provider]  carvedilol (COREG CR) 10 MG 24 hr capsule Take 10 mg by mouth daily.    [provider]  diclofenac sodium (VOLTAREN) 1 % GEL Apply 2  g topically 4 (four) times daily. 02/05/19   [provider]  escitalopram (LEXAPRO) 20 MG tablet Take 20 mg by mouth daily. 02/21/19   [provider]  furosemide (LASIX) 40 MG tablet Take 40 mg by mouth daily.    Rai, Vernelle Emerald, MD  hydrOXYzine (VISTARIL) 25 MG capsule Take 25 mg by mouth 2 (two) times daily as needed for itching. 01/06/19   [provider]  isosorbide dinitrate (ISORDIL) 20 MG tablet Take 20 mg by mouth 2 (two) times daily.     [provider]  lisinopril (PRINIVIL,ZESTRIL) 20 MG tablet Take 20 mg by mouth daily.  09/21/14   [provider]  loperamide (IMODIUM A-D) 2 MG tablet Take 4 mg by mouth 4 (four) times daily as needed for diarrhea or loose stools.     [provider]  MYRBETRIQ 25 MG TB24 tablet Take 1 tablet (25 mg total) by mouth daily. 03/13/19   Dennard Nip D, MD  omeprazole (PRILOSEC) 20 MG capsule Take 20 mg by mouth 2 (two) times daily. 10/05/16   [provider]  potassium chloride (KLOR-CON M10) 10 MEQ tablet Take 1 tablet (10 mEq total) by mouth 2 (two) times daily. 05/27/18   Jearld Lesch A, DO  sertraline (ZOLOFT) 100 MG tablet Take 100 mg by mouth daily.     [provider]  SitaGLIPtin-MetFORMIN HCl (JANUMET XR) 50-500 MG TB24 Take 1 tablet by mouth daily. 06/04/19   Whitmire, Joneen Boers, FNP  Vaginal Moisturizer ([employer] FEMININE MOISTURIZER) GEL Place 1 applicator vaginally every three (3) days as needed (vaginal dryness).  02/21/19   [provider]  Vitamin D, Ergocalciferol, (DRISDOL) 1.25 MG (50000 UNIT) CAPS capsule Take 1 capsule (50,000 Units total) by mouth every 7 (seven) days. 06/04/19   Whitmire, Dawn W, FNP  KOMBIGLYZE XR 5-500 MG TB24 Take 1 tablet by mouth daily.  10/11/15 11/26/18  [provider]    Allergies    Penicillins, Hydrocodone-acetaminophen, Latex, and Sulfa antibiotics  Review of Systems   Review of Systems  Constitutional: Positive for activity  change and fatigue. Negative for fever.  HENT: Negative for congestion, rhinorrhea and sore throat.   Respiratory: Positive for shortness of breath. Negative for cough, chest tightness and stridor.   Cardiovascular: Positive for leg swelling. Negative for chest pain and palpitations.  Gastrointestinal: Negative for abdominal pain.  Genitourinary: Negative for difficulty urinating.       Neg incontinence  Musculoskeletal: Positive for arthralgias, back pain, gait problem, joint swelling and myalgias. Negative for neck pain and neck stiffness.  Skin: Negative for color change.  Neurological: Positive for weakness. Negative for dizziness, seizures, numbness and headaches.    Physical Exam Updated Vital Signs BP 121/67   Pulse 66   Temp 99 F (37.2 C) (Oral)   Resp 18   Ht 4\' 11"  (1.499 m)   Wt 99.3 kg   SpO2 99%   BMI 44.22 kg/m   Physical Exam Vitals and nursing note reviewed.  Constitutional:      Appearance: She is well-developed. She is obese. She is not ill-appearing or toxic-appearing.  HENT:     Head: Normocephalic.  Cardiovascular:     Rate and Rhythm: Normal rate and regular rhythm.  Pulmonary:     Effort: Tachypnea present. No accessory muscle usage.     Breath sounds: Normal breath sounds. No wheezing, rhonchi or rales.     Comments: Shallow breaths Musculoskeletal:     Cervical back: Normal range of motion.     Right hip: Tenderness present. Decreased range of motion. Normal strength.     Left hip: Tenderness present. Decreased range of motion. Normal strength.     Right ankle: Swelling present. No lateral malleolus, medial malleolus or ATF ligament tenderness.     Right Achilles Tendon: No tenderness.     Left ankle: Swelling present. Tenderness (posterior to lateral malleolus where non-pitting edema concentrated is most tender) present. No lateral malleolus, medial malleolus or ATF ligament tenderness.     Left Achilles Tendon: No tenderness.     Comments:  Bilateral hips decreased flexion 2/2 pain about 50%. Neg straight leg test. Negative faber/fader.  tender to palpation over bilateral legs diffusely. No deformity.  Skin:    General: Skin is warm and dry.     Capillary Refill: Capillary refill takes less than 2 seconds.  Neurological:     General: No focal deficit present.     Mental Status: She is alert and oriented to person, place, and time.    ED Results / Procedures / Treatments   Labs (all labs ordered are listed, but only abnormal results are displayed) Labs Reviewed  BASIC METABOLIC PANEL - Abnormal; Notable for the following components:      Result Value   Glucose, Bld 137 (*)    All other components within normal limits  CBC  BRAIN NATRIURETIC PEPTIDE  TROPONIN I (HIGH SENSITIVITY)    EKG EKG Interpretation  Date/Time:  Saturday June 21 2019 18:57:59 EST Ventricular Rate:  76 PR Interval:  194 QRS Duration: 106 QT Interval:  392 QTC Calculation: 441 R Axis:  70 Text Interpretation: Normal sinus rhythm Incomplete left bundle branch block ST-t wave abnormality No significant change since last tracing Abnormal ekg Confirmed by Carmin Muskrat [phone]) on 06/21/2019 7:57:34 PM   Radiology DG Chest 2 View  Result Date: 06/21/2019 CLINICAL DATA:  Shortness of breath, weakness EXAM: CHEST - 2 VIEW COMPARISON:  05/19/2019 FINDINGS: Cardiomegaly. Pulmonary vascular prominence and mild, diffuse interstitial opacity. Disc degenerative disease of the thoracic spine. IMPRESSION: Cardiomegaly with pulmonary vascular prominence and mild, diffuse interstitial pulmonary opacity, likely mild edema. No focal airspace opacity. Electronically Signed   By: Eddie Candle M.D.   On: 06/21/2019 19:46   DG Lumbar Spine 2-3 Views  Result Date: 06/21/2019 CLINICAL DATA:  [place] 5 days ago, lower back and hip pain EXAM: LUMBAR SPINE - 2-3 VIEW COMPARISON:  02/06/2018 FINDINGS: Frontal and lateral views of the lumbar spine are obtained. There  are 5 non-rib-bearing lumbar type vertebral bodies in stable alignment, with mild left convex curvature centered at L3. Disc spaces are well preserved. Pronounced facet hypertrophy at L4-5 and L5-S1, stable. No acute fractures. Stable asymmetric sclerosis of the left sacroiliac joint. Left hip arthroplasty partially visualized. IMPRESSION: 1. Stable facet hypertrophic changes at the lumbosacral junction and mild left convex scoliosis. 2. Stable asymmetric sacroiliitis. 3. No acute fracture. Electronically Signed   By: Randa Ngo M.D.   On: 06/21/2019 21:30   DG Hips Bilat W or Wo Pelvis 3-4 Views  Result Date: 06/21/2019 CLINICAL DATA:  [place], hip pain EXAM: DG HIP (WITH OR WITHOUT PELVIS) 3-4V BILAT COMPARISON:  12/04/2018 FINDINGS: Frontal view of the pelvis as well as frontal and frogleg lateral views of both hips are obtained. There is stable mild to moderate right hip osteoarthritis with joint space narrowing and osteophyte formation. Enthesopathic changes are seen at the greater trochanter. There is a stable left hip arthroplasty without evidence of complication. Heterotopic ossification is again noted. There are no acute displaced fractures. Asymmetric sclerosis of the sacroiliac joints, left greater than right, stable. IMPRESSION: 1. No acute bony abnormality. 2. Stable left hip arthroplasty. 3. Stable right hip osteoarthritis. 4. Stable asymmetric sacroiliitis. Electronically Signed   By: Randa Ngo M.D.   On: 06/21/2019 21:31    Procedures Procedures (including critical care time)  Medications Ordered in ED Medications  sodium chloride flush (NS) 0.9 % injection 3 mL (3 mLs Intravenous Given 06/21/19 2054)  acetaminophen (TYLENOL) tablet 500 mg (500 mg Oral Given 06/21/19 2029)  furosemide (LASIX) injection 60 mg (60 mg Intravenous Given 06/21/19 2052)    ED Course  I have reviewed the triage vital signs and the nursing notes.  Pertinent labs & imaging results that were available  during my care of the patient were reviewed by me and considered in my medical decision making (see chart for details).    MDM Rules/Calculators/A&P                     Concern for CHF exacerbation leading to SOB given history and xray findings. Stable ORA. Given IV lasix in ED with great improvement in her subjective breathing status and clinical appearance. BNP wnl. Low likelihood infection, PE, MI, asthma exacerbation. Recommend follow up with cardiologist this week and can take increased lasix dose until returned to dry weight. BMP wnl other than elevated glucose.  Concern for compression fracture vs acute worsening of DJD. Less likely conus medullaris. Xray of hips/pelvis/lumbar negative for acute changes. Patient given tylenol for pain and improved. Recommended patient  follow up with PCP for further management.  Final Clinical Impression(s) / ED Diagnoses Final diagnoses:  None    Rx / DC Orders ED Discharge Orders    None       Richarda Osmond, DO 06/21/19 2154    Carmin Muskrat, MD 06/23/19 2033

## 2019-06-21 NOTE — Discharge Instructions (Signed)
I'm glad that you're feeling better. You can continue to take the full 2 pills of your lasix daily until your weight is back to your baseline and you can see your heart doctor.

## 2019-06-21 NOTE — ED Notes (Signed)
Purewick placed on pt. 

## 2019-06-24 ENCOUNTER — Encounter (INDEPENDENT_AMBULATORY_CARE_PROVIDER_SITE_OTHER): Payer: Self-pay | Admitting: Bariatrics

## 2019-06-24 ENCOUNTER — Other Ambulatory Visit: Payer: Self-pay

## 2019-06-24 ENCOUNTER — Ambulatory Visit (INDEPENDENT_AMBULATORY_CARE_PROVIDER_SITE_OTHER): Payer: Medicare Other | Admitting: Psychology

## 2019-06-24 ENCOUNTER — Encounter (INDEPENDENT_AMBULATORY_CARE_PROVIDER_SITE_OTHER): Payer: Self-pay

## 2019-06-24 ENCOUNTER — Ambulatory Visit (INDEPENDENT_AMBULATORY_CARE_PROVIDER_SITE_OTHER): Payer: Medicare Other | Admitting: Bariatrics

## 2019-06-24 ENCOUNTER — Telehealth (INDEPENDENT_AMBULATORY_CARE_PROVIDER_SITE_OTHER): Payer: Self-pay | Admitting: Psychology

## 2019-06-24 VITALS — BP 114/73 | Temp 98.0°F | Ht 59.0 in | Wt 219.0 lb

## 2019-06-24 DIAGNOSIS — E559 Vitamin D deficiency, unspecified: Secondary | ICD-10-CM

## 2019-06-24 DIAGNOSIS — I10 Essential (primary) hypertension: Secondary | ICD-10-CM

## 2019-06-24 DIAGNOSIS — Z9189 Other specified personal risk factors, not elsewhere classified: Secondary | ICD-10-CM | POA: Diagnosis not present

## 2019-06-24 DIAGNOSIS — Z6841 Body Mass Index (BMI) 40.0 and over, adult: Secondary | ICD-10-CM

## 2019-06-24 MED ORDER — VITAMIN D (ERGOCALCIFEROL) 1.25 MG (50000 UNIT) PO CAPS: 50000.0000 [IU] | ORAL_CAPSULE | ORAL | 0 refills | Status: DC

## 2019-06-24 NOTE — Progress Notes (Signed)
Chief Complaint:   OBESITY [name] is here to discuss her progress with her obesity treatment plan along with follow-up of her obesity related diagnoses. [name] is on the Category 2 Plan and states she is following her eating plan approximately 70% of the time. [name] states she is exercising 0 minutes 0 times per week.  Today's visit was #: 25 Starting weight: 225 lbs Starting date: 11/20/2017 Today's weight: 219 lbs Today's date: 06/24/2019 Total lbs lost to date: 6 Total lbs lost since last in-office visit: 0  Interim History: [name] normally sees [employer], NP. Her weight remains the same today. She struggles with skipping meals.  Subjective:   Essential hypertension. [name] is taking Coreg CR and Zestril. Blood pressure is well controlled.  BP Readings from Last 3 Encounters:  06/24/19 114/73  06/21/19 121/67  06/04/19 130/78   Lab Results  Component Value Date   CREATININE 0.84 06/21/2019   CREATININE 1.00 05/19/2019   CREATININE 0.95 03/06/2019   Vitamin D deficiency. No nausea, vomiting, or muscle weakness. Last Vitamin D 36.9 on 06/04/2019.  At risk for osteoporosis. [name] is at higher risk of osteopenia and osteoporosis due to Vitamin D deficiency.   Assessment/Plan:   Essential hypertension. [name] is working on healthy weight loss and exercise to improve blood pressure control. We will watch for signs of hypotension as she continues her lifestyle modifications. She will continue her medications as directed.  Vitamin D deficiency. Low Vitamin D level contributes to fatigue and are associated with obesity, breast, and colon cancer. She was given a prescription for Vitamin D, Ergocalciferol, (DRISDOL) 1.25 MG (50000 UNIT) CAPS capsule every week #4 with 0 refills and will follow-up for routine testing of Vitamin D, at least 2-3 times per year to avoid over-replacement.     At risk for osteoporosis. [name] was given approximately 15 minutes of  osteoporosis prevention counseling today. [name] is at risk for osteopenia and osteoporosis due to her Vitamin D deficiency. She was encouraged to take her Vitamin D and follow her higher calcium diet and increase strengthening exercise to help strengthen her bones and decrease her risk of osteopenia and osteoporosis.  Repetitive spaced learning was employed today to elicit superior memory formation and behavioral change.  Class 3 severe obesity with serious comorbidity and body mass index (BMI) of 40.0 to 44.9 in adult, unspecified obesity type ([name]).  [name] is currently in the action stage of change. As such, her goal is to continue with weight loss efforts. She has agreed to the Category 2 Plan.   She will work on meal planning, will be more adherent to the plan, will work on not skipping meals, and will talk with her cardiologist about proper fluid intake.  Exercise goals: All adults should avoid inactivity. Some physical activity is better than none, and adults who participate in any amount of physical activity gain some health benefits.  Behavioral modification strategies: increasing lean protein intake, decreasing simple carbohydrates, increasing vegetables, increasing water intake, decreasing eating out, no skipping meals, meal planning and cooking strategies, keeping healthy foods in the home, avoiding temptations and planning for success.  [name] has agreed to follow-up with our clinic in 2 weeks. She was informed of the importance of frequent follow-up visits to maximize her success with intensive lifestyle modifications for her multiple health conditions.   Objective:   Blood pressure 114/73, temperature 98 F (36.7 C), height 4\' 11"  (1.499 m), weight 219 lb (99.3 kg), SpO2 96 %.  Body mass index is 44.23 kg/m.  General: Cooperative, alert, well developed, in no acute distress. HEENT: Conjunctivae and lids unremarkable. Cardiovascular: Regular rhythm.  Lungs: Normal work of  breathing. Neurologic: No focal deficits.   Lab Results  Component Value Date   CREATININE 0.84 06/21/2019   BUN 13 06/21/2019   NA 142 06/21/2019   K 3.9 06/21/2019   CL 106 06/21/2019   CO2 29 06/21/2019   Lab Results  Component Value Date   ALT 25 05/19/2019   AST 23 05/19/2019   ALKPHOS 51 05/19/2019   BILITOT 0.6 05/19/2019   Lab Results  Component Value Date   HGBA1C 6.3 (H) 12/26/2018   HGBA1C 6.6 (H) 05/22/2018   HGBA1C 6.8 (H) 03/04/2018   HGBA1C 8.8 (H) 11/20/2017   HGBA1C 7.4 (H) 08/11/2016   Lab Results  Component Value Date   INSULIN 28.1 (H) 12/26/2018   INSULIN 20.2 03/04/2018   INSULIN 53.9 (H) 11/20/2017   Lab Results  Component Value Date   TSH 0.458 03/05/2019   Lab Results  Component Value Date   CHOL 119 12/26/2018   HDL 58 12/26/2018   LDLCALC 41 12/26/2018   TRIG 108 12/26/2018   CHOLHDL 4.2 04/23/2011   CHOLHDL 4.3 04/23/2011   Lab Results  Component Value Date   WBC 5.7 06/21/2019   HGB 12.1 06/21/2019   HCT 38.1 06/21/2019   MCV 88.0 06/21/2019   PLT 235 06/21/2019   No results found for: IRON, TIBC, FERRITIN  Attestation Statements:   Reviewed by clinician on day of visit: allergies, medications, problem list, medical history, surgical history, family history, social history, and previous encounter notes.  Migdalia Dk, am acting as Location manager for [employer], DO   I have reviewed the above documentation for accuracy and completeness, and I agree with the above. Jearld Lesch, DO

## 2019-06-24 NOTE — Telephone Encounter (Signed)
  Office: [phone]  /  Fax: [phone]  Date of Call: June 24, 2019  Time of Call: 4:02pm Provider: Glennie Isle, PsyD  CONTENT: This provider called [place] to check-in as she did not present for today's Webex appointment at 4:00pm. A HIPAA compliant voicemail was left requesting a call back. Of note, this provider stayed on the WebEx appointment for 5 minutes prior to signing off per the clinic's grace period policy.    PLAN: This provider will wait for Phung to call back. No further follow-up planned by this provider.

## 2019-06-24 NOTE — Progress Notes (Signed)
Cardiology Office Note   Date:  06/25/2019   ID:  Tara, Barrera 08-22-1960, MRN [id number]  PCP:  Sonia Side., FNP  Cardiologist:   [name], MD   No chief complaint on file.     History of Present Illness: [name] is a 59 y.o. female who presents for f/u of atypical chest pain, diastolic CHF, HTN, HLD , DM and morbid obesity.. Distant history of PE Rx coumadin CT June 2018 no PE off anticoagulation. Atypical chest pains in past with multiple normal caths most recently 2016 TTE done 05/22/16 EF 0000000 grade 2 diastolic no valve disease unable to estimate PA pressures   She had her left TKR with Dr Marlou Sa 4 years ago   Left THR 05/2018 Dr Marlou Sa   DC from hospital 03/06/19 for atypical chest pain CTA negative for PE Stress taking care of her mother and losing her Dog recently Troponin chronically elevated with no trend despite 8/10 pain. No acute ECG changes Echo 03/06/19 with normal EF and no RWMAls or pericardial effusion grade one diastolic dysfunction EF [id number]   Seen in ER again 06/21/19 with dyspnea and mechanical fall 5 days prior. She indicated dry weight 22- and was up 4 lsbs. CXR suggested congestion but BNP was normal Rx iv lasix and told to take higher dose on d/c Xragy sof hips/pelvis/lumbar region negative   I see her mom [name] lives in same apartment complex  Normal myovue 04/11/19 Echo 03/06/19  EF 60-65% only grade one diastolic dysfunction    Discussed low sodium diet , weight loss and taking lasix  Past Medical History:  Diagnosis Date  . Anginal pain ([place])    a. NL cath in 2008;  b. Myoview 03/2011: dec uptake along mid anterior wall on stress imaging -> ? attenuation vs. ischemia, EF 65%;  c. Echo 04/2011: EF 55-60%, no RWMA, Gr 2 dd  . Anxiety   . Arthritis   . Asthma   . Back pain   . Bone cancer ([place])   . Cancer ([place])   . Chest pain   . CHF (congestive heart failure) ([place])   . CHF (congestive heart failure) ([place])     . Depression   . Diabetes mellitus   . Drug use   . Dyspnea    with exertion  . Dyspnea   . Frequent urination   . GERD (gastroesophageal reflux disease)   . Glaucoma   . HLD (hyperlipidemia)   . Hypertension   . IBS (irritable bowel syndrome)   . Joint pain   . Lactose intolerance   . Leg edema   . 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 Obesity   . Palpitations   . Pneumonia 05/2016   double  . Pulmonary edema   . Pulmonary embolism ([place])    a. 2008 -> coumadin x 6 mos.  . Rheumatoid arthritis ([place])   . Sleep apnea    on CPAP 02/2018  . TIA (transient ischemic attack)   . Urinary urgency     Past Surgical History:  Procedure Laterality Date  . ABDOMINAL HYSTERECTOMY  2005  . APPENDECTOMY    . CARDIAC CATHETERIZATION     Normal  . CARDIAC CATHETERIZATION N/A 09/30/2014   Procedure: Left Heart Cath and Coronary Angiography;  Surgeon: Sherren Mocha, MD;  Location: [facility];  Service: Cardiovascular;  Laterality: N/A;  . LAPAROSCOPIC APPENDECTOMY N/A 06/03/2012  Procedure: APPENDECTOMY LAPAROSCOPIC;  Surgeon: Stark Klein, MD;  Location: [facility];  Service: General;  Laterality: N/A;  . Left knee surgery  2008  . LEG SURGERY    . TONSILLECTOMY    . TOTAL HIP ARTHROPLASTY Left 06/11/2018   Procedure: LEFT TOTAL HIP ARTHROPLASTY ANTERIOR APPROACH;  Surgeon: Meredith Pel, MD;  Location: [facility];  Service: Orthopedics;  Laterality: Left;  . TOTAL KNEE ARTHROPLASTY Left 08/22/2016   Procedure: TOTAL KNEE ARTHROPLASTY;  Surgeon: Meredith Pel, MD;  Location: [facility];  Service: Orthopedics;  Laterality: Left;  . TUBAL LIGATION  1989     Current Outpatient Medications  Medication Sig Dispense Refill  . albuterol (PROVENTIL HFA;VENTOLIN HFA) 108 (90 Base) MCG/ACT inhaler Inhale 2 puffs into the lungs every 6 (six) hours as needed for wheezing or shortness of breath.    [name] aspirin 81 MG chewable tablet Chew 81 mg by mouth daily.    [name]  atorvastatin (LIPITOR) 40 MG tablet Take 40 mg by mouth at bedtime.     . busPIRone (BUSPAR) 15 MG tablet Take 15 mg by mouth 2 (two) times daily.    . busPIRone (BUSPAR) 30 MG tablet Take 30 mg by mouth 2 (two) times daily.    . carvedilol (COREG CR) 10 MG 24 hr capsule Take 10 mg by mouth daily.    . diclofenac sodium (VOLTAREN) 1 % GEL Apply 2 g topically 4 (four) times daily.    [name] escitalopram (LEXAPRO) 20 MG tablet Take 20 mg by mouth daily.    . furosemide (LASIX) 40 MG tablet Take 40 mg by mouth daily.    . hydrOXYzine (VISTARIL) 25 MG capsule Take 25 mg by mouth 2 (two) times daily as needed for itching.    . isosorbide dinitrate (ISORDIL) 20 MG tablet Take 20 mg by mouth 2 (two) times daily.     [name] lisinopril (PRINIVIL,ZESTRIL) 20 MG tablet Take 20 mg by mouth daily.     [name] loperamide (IMODIUM A-D) 2 MG tablet Take 4 mg by mouth 4 (four) times daily as needed for diarrhea or loose stools.     [name] MYRBETRIQ 25 MG TB24 tablet Take 1 tablet (25 mg total) by mouth daily. 30 tablet 0  . omeprazole (PRILOSEC) 20 MG capsule Take 20 mg by mouth 2 (two) times daily.    . potassium chloride (KLOR-CON M10) 10 MEQ tablet Take 1 tablet (10 mEq total) by mouth 2 (two) times daily. 60 tablet 1  . sertraline (ZOLOFT) 100 MG tablet Take 100 mg by mouth daily.     . SitaGLIPtin-MetFORMIN HCl (JANUMET XR) 50-500 MG TB24 Take 1 tablet by mouth daily. 30 tablet 0  . Vaginal Moisturizer ([employer] FEMININE MOISTURIZER) GEL Place 1 applicator vaginally every three (3) days as needed (vaginal dryness).     . Vitamin D, Ergocalciferol, (DRISDOL) 1.25 MG (50000 UNIT) CAPS capsule Take 1 capsule (50,000 Units total) by mouth every 7 (seven) days. 4 capsule 0   No current facility-administered medications for this visit.    Allergies:   Penicillins, Hydrocodone-acetaminophen, Latex, and Sulfa antibiotics    Social History:  The patient  reports that she quit smoking about 34 years ago. Her smoking use included  cigarettes. She has a 7.50 pack-year smoking history. She has never used smokeless tobacco. She reports previous drug use. Drug: Marijuana. She reports that she does not drink alcohol.   Family History:  The patient's family history includes Alcoholism in her father; Anxiety disorder  in her mother; Arthritis in her mother; Asthma in her brother; Bipolar disorder in her father; Breast cancer in her maternal aunt; Colon cancer in her maternal grandfather; Depression in her mother; Diabetes in her father, mother, and sister; Drug abuse in her father; Emphysema in her mother; Heart disease in her father and paternal grandfather; Heart failure in her mother; Hyperlipidemia in her father and mother; Hypertension in her father and mother; Sleep apnea in her father; Stroke in her father and mother.    ROS:  Please see the history of present illness.   Otherwise, review of systems are positive for none.   All other systems are reviewed and negative.    PHYSICAL EXAM: VS:  BP 112/76   Pulse 98   Ht 4\' 11"  (1.499 m)   Wt 225 lb (102.1 kg)   SpO2 98%   BMI 45.44 kg/m  , BMI Body mass index is 45.44 kg/m. Affect appropriate Obese black female  HEENT: normal Neck supple with no adenopathy JVP normal no bruits no thyromegaly Lungs clear with no wheezing and good diaphragmatic motion Heart:  S1/S2 no murmur, no rub, gallop or click PMI normal Abdomen: benighn, BS positve, no tenderness, no AAA no bruit.  No HSM or HJR Distal pulses intact with no bruits No edema Neuro non-focal Skin warm and dry LEFT TKR  LEFT THR     EKG:  03/05/19 SR rate 66 nonspecific ST changes    Recent Labs: 03/05/2019: TSH 0.458 05/19/2019: ALT 25 06/21/2019: B Natriuretic Peptide 23.5; BUN 13; Creatinine, Ser 0.84; Hemoglobin 12.1; Platelets 235; Potassium 3.9; Sodium 142    Lipid Panel    Component Value Date/Time   CHOL 119 12/26/2018 1135   TRIG 108 12/26/2018 1135   HDL 58 12/26/2018 1135   CHOLHDL 4.2  04/23/2011 0830   CHOLHDL 4.3 04/23/2011 0830   VLDL 29 04/23/2011 0830   VLDL 29 04/23/2011 0830   LDLCALC 41 12/26/2018 1135      Wt Readings from Last 3 Encounters:  06/25/19 225 lb (102.1 kg)  06/24/19 219 lb (99.3 kg)  06/21/19 218 lb 14.7 oz (99.3 kg)      Other studies Reviewed: Additional studies/ records that were reviewed today include: Notes from primary , ortho weight loss center Cardiology notes cath x 2 TTE.    ASSESSMENT AND PLAN:  1.  Diastolic Dysfunction related to obesity, HTN and DM continue lasix  TTE 03/06/19 normal EF 60-65% no valve disease  2.  HTN:  Well controlled.  Continue current medications and low sodium Dash type diet.   3. HLD:  Continue statin labs with primary  4. Ortho  Post left total hip replacement 05/2018 recent fall with negative xrays  5. DM:  Discussed low carb diet.  Target hemoglobin A1c is 6.5 or less.  Continue current medications. 6. Chest Pain : atypical  r/o in hospital Last normal cath done 09/30/14  F/u lexiscan myovue 04/11/19 normal observe   Current medicines are reviewed at length with the patient today.  The patient does not have concerns regarding medicines.  The following changes have been made:  no change  Labs/ tests ordered today include: Lexiscan Myovue   No orders of the defined types were placed in this encounter.    Disposition:   FU with cardiology in 6 months     Signed, [name], MD  06/25/2019 11:08 AM    [address], [place], [place]  [id number]  Phone: [phone]; Fax: [phone]

## 2019-06-25 ENCOUNTER — Encounter (INDEPENDENT_AMBULATORY_CARE_PROVIDER_SITE_OTHER): Payer: Self-pay | Admitting: Bariatrics

## 2019-06-25 ENCOUNTER — Ambulatory Visit (INDEPENDENT_AMBULATORY_CARE_PROVIDER_SITE_OTHER): Payer: Medicare Other | Admitting: Cardiovascular Disease

## 2019-06-25 ENCOUNTER — Encounter: Payer: Self-pay | Admitting: Cardiovascular Disease

## 2019-06-25 VITALS — BP 112/76 | HR 98 | Ht 59.0 in | Wt 225.0 lb

## 2019-06-25 DIAGNOSIS — I5032 Chronic diastolic (congestive) heart failure: Secondary | ICD-10-CM | POA: Diagnosis not present

## 2019-06-25 NOTE — Patient Instructions (Addendum)

## 2019-06-27 ENCOUNTER — Ambulatory Visit
Admission: RE | Admit: 2019-06-27 | Discharge: 2019-06-27 | Disposition: A | Discharge status: Home / Self Care | Payer: Medicare Other | Source: Ambulatory Visit | Attending: Family | Admitting: Family

## 2019-06-27 ENCOUNTER — Other Ambulatory Visit: Payer: Self-pay

## 2019-06-27 DIAGNOSIS — Z1231 Encounter for screening mammogram for malignant neoplasm of breast: Secondary | ICD-10-CM

## 2019-07-10 ENCOUNTER — Telehealth (INDEPENDENT_AMBULATORY_CARE_PROVIDER_SITE_OTHER): Payer: Self-pay | Admitting: Bariatrics

## 2019-07-10 ENCOUNTER — Other Ambulatory Visit (INDEPENDENT_AMBULATORY_CARE_PROVIDER_SITE_OTHER): Payer: Self-pay

## 2019-07-10 DIAGNOSIS — E119 Type 2 diabetes mellitus without complications: Secondary | ICD-10-CM

## 2019-07-10 MED ORDER — JANUMET XR 50-500 MG PO TB24: 1.0000 | ORAL_TABLET | Freq: Every day | ORAL | 0 refills | Status: DC

## 2019-07-10 NOTE — Telephone Encounter (Signed)
Patient is requesting refills for her diabetes medication be sent over to [facility].  She doesn't know the name of it and doesn't know how to do My Chart.    Thank you

## 2019-07-10 NOTE — Telephone Encounter (Signed)
Prescription was sent to the pharmacy per Dr Leafy Ro.   Inita Uram, [place]

## 2019-07-17 ENCOUNTER — Other Ambulatory Visit: Payer: Self-pay

## 2019-07-17 ENCOUNTER — Ambulatory Visit (INDEPENDENT_AMBULATORY_CARE_PROVIDER_SITE_OTHER): Payer: Medicare Other | Admitting: Family Medicine

## 2019-07-17 VITALS — BP 100/65 | HR 73 | Temp 97.9°F | Ht 59.0 in | Wt 222.0 lb

## 2019-07-17 DIAGNOSIS — I5032 Chronic diastolic (congestive) heart failure: Secondary | ICD-10-CM

## 2019-07-17 DIAGNOSIS — Z6841 Body Mass Index (BMI) 40.0 and over, adult: Secondary | ICD-10-CM | POA: Diagnosis not present

## 2019-07-17 DIAGNOSIS — F3289 Other specified depressive episodes: Secondary | ICD-10-CM

## 2019-07-17 DIAGNOSIS — E559 Vitamin D deficiency, unspecified: Secondary | ICD-10-CM

## 2019-07-17 MED ORDER — VITAMIN D (ERGOCALCIFEROL) 1.25 MG (50000 UNIT) PO CAPS: 50000.0000 [IU] | ORAL_CAPSULE | ORAL | 0 refills | Status: DC

## 2019-07-17 NOTE — Progress Notes (Signed)
Chief Complaint:   OBESITY [name] is here to discuss her progress with her obesity treatment plan along with follow-up of her obesity related diagnoses. [name] is on the Category 2 Plan and states she is following her eating plan approximately 70% of the time. [name] states she is doing 0 minutes 0 times per week.  Today's visit was #: 26 Starting weight: 225 lbs Starting date: 11/20/2017 Today's weight: 222 lbs Today's date: 07/17/2019 Total lbs lost to date: 3 Total lbs lost since last in-office visit: 0  Interim History: [name] was on a weekend trip recently and was somewhat off the plan though she did try to make good choices. She has been skipping meals. Her recent trip with family was very upsetting to her psychologically which has made it hard for her to eat as she should.   Subjective:   1. Chronic diastolic congestive heart failure (HCC) [name] has been off her medications for 2 days due to recent trip, and she forgot her medications. She reports mild shortness of breath, and she reports mild swelling in the ankles and feet. She had a recent Cardiology visit on March 3rd.  Water weight is up 2.6 lbs today.  2. Vitamin D deficiency [name]'s last Vit D level is not at goal. Last Vit D level was 36.9 on 06/04/2019. She is on prescription Vit D.  3. Other depression with emotional eating  [name] reports depression due to recent family trip. She feels unappreciated by her children. She is on Zoloft. She reports her primary care physician is in progress of referring her to Psychiatry. She reports Buspar was recently discontinued by her primary care physician. She denies suicidal Ideas or homicidal ideas.  Assessment/Plan:   1. Chronic diastolic congestive heart failure (HCC) [name] will take all of her medications when she gets home today. She is to follow up with Cardiology if her symptoms worsen.  2. Vitamin D deficiency Low Vitamin D level contributes to fatigue and are  associated with obesity, breast, and colon cancer. We will refill prescription Vitamin D for 1 month. [name] will follow-up for routine testing of Vitamin D, at least 2-3 times per year to avoid over-replacement.  - Vitamin D, Ergocalciferol, (DRISDOL) 1.25 MG (50000 UNIT) CAPS capsule; Take 1 capsule (50,000 Units total) by mouth every 7 (seven) days.  Dispense: 4 capsule; Refill: 0  3. Other depression with emotional eating  Behavior modification techniques were discussed today to help [name] deal with her emotional/non-hunger eating behaviors. [name] agreed to continue Zoloft, and she will contact her primary care physician to check on the Psychiatry referral. Orders and follow up as documented in patient record.   4. Class 3 severe obesity with serious comorbidity and body mass index (BMI) of 40.0 to 44.9 in adult, unspecified obesity type (HCC) [name] is currently in the action stage of change. As such, her goal is to continue with weight loss efforts. She has agreed to the Category 2 Plan.   Exercise goals: All adults should avoid inactivity. Some physical activity is better than none, and adults who participate in any amount of physical activity gain some health benefits.  Behavioral modification strategies: increasing lean protein intake, decreasing simple carbohydrates, no skipping meals and planning for success.  [name] has agreed to follow-up with our clinic in 3 weeks. She was informed of the importance of frequent follow-up visits to maximize her success with intensive lifestyle modifications for her multiple health conditions.   Objective:   Blood  pressure 100/65, pulse 73, temperature 97.9 F (36.6 C), temperature source Oral, height 4\' 11"  (1.499 m), weight 222 lb (100.7 kg), SpO2 97 %. Body mass index is 44.84 kg/m.  General: Cooperative, alert, well developed. Tearful during visit. HEENT: Conjunctivae and lids unremarkable. Cardiovascular: RRR Lungs: Normal work of  breathing. Lungs CTA. Neurologic: No focal deficits.  No edema in ankles.  Lab Results  Component Value Date   CREATININE 0.84 06/21/2019   BUN 13 06/21/2019   NA 142 06/21/2019   K 3.9 06/21/2019   CL 106 06/21/2019   CO2 29 06/21/2019   Lab Results  Component Value Date   ALT 25 05/19/2019   AST 23 05/19/2019   ALKPHOS 51 05/19/2019   BILITOT 0.6 05/19/2019   Lab Results  Component Value Date   HGBA1C 6.3 (H) 12/26/2018   HGBA1C 6.6 (H) 05/22/2018   HGBA1C 6.8 (H) 03/04/2018   HGBA1C 8.8 (H) 11/20/2017   HGBA1C 7.4 (H) 08/11/2016   Lab Results  Component Value Date   INSULIN 28.1 (H) 12/26/2018   INSULIN 20.2 03/04/2018   INSULIN 53.9 (H) 11/20/2017   Lab Results  Component Value Date   TSH 0.458 03/05/2019   Lab Results  Component Value Date   CHOL 119 12/26/2018   HDL 58 12/26/2018   LDLCALC 41 12/26/2018   TRIG 108 12/26/2018   CHOLHDL 4.2 04/23/2011   CHOLHDL 4.3 04/23/2011   Lab Results  Component Value Date   WBC 5.7 06/21/2019   HGB 12.1 06/21/2019   HCT 38.1 06/21/2019   MCV 88.0 06/21/2019   PLT 235 06/21/2019   No results found for: IRON, TIBC, FERRITIN  Obesity Behavioral Intervention Documentation for Insurance:   Approximately 15 minutes were spent on the discussion below.  ASK: We discussed the diagnosis of obesity with [name] today and [name] agreed to give [name] permission to discuss obesity behavioral modification therapy today.  ASSESS: [name] has the diagnosis of obesity and her BMI today is 44.81. [name] is in the action stage of change.   ADVISE: [name] was educated on the multiple health risks of obesity as well as the benefit of weight loss to improve her health. She was advised of the need for long term treatment and the importance of lifestyle modifications to improve her current health and to decrease her risk of future health problems.  AGREE: Multiple dietary modification options and treatment options were discussed and  [name] agreed to follow the recommendations documented in the above note.  ARRANGE: [name] was educated on the importance of frequent visits to treat obesity as outlined per CMS and USPSTF guidelines and agreed to schedule her next follow up appointment today.  Attestation Statements:   Reviewed by clinician on day of visit: allergies, medications, problem list, medical history, surgical history, family history, social history, and previous encounter notes.   Wilhemena Durie, am acting as Location manager for [employer], FNP-C.  I have reviewed the above documentation for accuracy and completeness, and I agree with the above. -  Tara Fick, FNP

## 2019-07-21 ENCOUNTER — Encounter (INDEPENDENT_AMBULATORY_CARE_PROVIDER_SITE_OTHER): Payer: Self-pay | Admitting: Family Medicine

## 2019-08-03 ENCOUNTER — Other Ambulatory Visit (INDEPENDENT_AMBULATORY_CARE_PROVIDER_SITE_OTHER): Payer: Self-pay | Admitting: Family Medicine

## 2019-08-03 DIAGNOSIS — E119 Type 2 diabetes mellitus without complications: Secondary | ICD-10-CM

## 2019-08-07 ENCOUNTER — Ambulatory Visit (INDEPENDENT_AMBULATORY_CARE_PROVIDER_SITE_OTHER): Payer: Medicare Other | Admitting: Family Medicine

## 2019-08-12 ENCOUNTER — Other Ambulatory Visit (INDEPENDENT_AMBULATORY_CARE_PROVIDER_SITE_OTHER): Payer: Self-pay | Admitting: Family Medicine

## 2019-08-12 DIAGNOSIS — E119 Type 2 diabetes mellitus without complications: Secondary | ICD-10-CM

## 2019-08-13 ENCOUNTER — Telehealth (INDEPENDENT_AMBULATORY_CARE_PROVIDER_SITE_OTHER): Payer: Self-pay | Admitting: Family Medicine

## 2019-08-13 ENCOUNTER — Other Ambulatory Visit (INDEPENDENT_AMBULATORY_CARE_PROVIDER_SITE_OTHER): Payer: Self-pay | Admitting: Family Medicine

## 2019-08-13 DIAGNOSIS — E119 Type 2 diabetes mellitus without complications: Secondary | ICD-10-CM

## 2019-08-14 ENCOUNTER — Emergency Department (HOSPITAL_COMMUNITY)
Admission: EM | Admit: 2019-08-14 | Discharge: 2019-08-15 | Disposition: A | Discharge status: Home / Self Care | Payer: Medicare Other | Attending: Emergency Medicine | Admitting: Emergency Medicine

## 2019-08-14 ENCOUNTER — Emergency Department (HOSPITAL_COMMUNITY): Payer: Medicare Other

## 2019-08-14 ENCOUNTER — Other Ambulatory Visit: Payer: Self-pay

## 2019-08-14 ENCOUNTER — Encounter (HOSPITAL_COMMUNITY): Payer: Self-pay | Admitting: Emergency Medicine

## 2019-08-14 ENCOUNTER — Other Ambulatory Visit (INDEPENDENT_AMBULATORY_CARE_PROVIDER_SITE_OTHER): Payer: Self-pay | Admitting: Family Medicine

## 2019-08-14 DIAGNOSIS — E119 Type 2 diabetes mellitus without complications: Secondary | ICD-10-CM | POA: Diagnosis not present

## 2019-08-14 DIAGNOSIS — I110 Hypertensive heart disease with heart failure: Secondary | ICD-10-CM | POA: Diagnosis not present

## 2019-08-14 DIAGNOSIS — I5030 Unspecified diastolic (congestive) heart failure: Secondary | ICD-10-CM | POA: Insufficient documentation

## 2019-08-14 DIAGNOSIS — Z86718 Personal history of other venous thrombosis and embolism: Secondary | ICD-10-CM | POA: Insufficient documentation

## 2019-08-14 DIAGNOSIS — M79604 Pain in right leg: Secondary | ICD-10-CM | POA: Diagnosis not present

## 2019-08-14 DIAGNOSIS — R42 Dizziness and giddiness: Secondary | ICD-10-CM | POA: Insufficient documentation

## 2019-08-14 DIAGNOSIS — Z79899 Other long term (current) drug therapy: Secondary | ICD-10-CM | POA: Insufficient documentation

## 2019-08-14 DIAGNOSIS — R112 Nausea with vomiting, unspecified: Secondary | ICD-10-CM | POA: Insufficient documentation

## 2019-08-14 DIAGNOSIS — Z87891 Personal history of nicotine dependence: Secondary | ICD-10-CM | POA: Diagnosis not present

## 2019-08-14 DIAGNOSIS — Z7982 Long term (current) use of aspirin: Secondary | ICD-10-CM | POA: Insufficient documentation

## 2019-08-14 DIAGNOSIS — Z7984 Long term (current) use of oral hypoglycemic drugs: Secondary | ICD-10-CM | POA: Diagnosis not present

## 2019-08-14 DIAGNOSIS — R079 Chest pain, unspecified: Secondary | ICD-10-CM | POA: Diagnosis not present

## 2019-08-14 DIAGNOSIS — R110 Nausea: Secondary | ICD-10-CM

## 2019-08-14 DIAGNOSIS — Z91040 Latex allergy status: Secondary | ICD-10-CM | POA: Insufficient documentation

## 2019-08-14 LAB — CBC
HCT: 40.4 % (ref 36.0–46.0)
Hemoglobin: 12.8 g/dL (ref 12.0–15.0)
MCH: 27.9 pg (ref 26.0–34.0)
MCHC: 31.7 g/dL (ref 30.0–36.0)
MCV: 88.2 fL (ref 80.0–100.0)
Platelets: 243 10*3/uL (ref 150–400)
RBC: 4.58 MIL/uL (ref 3.87–5.11)
RDW: 13.2 % (ref 11.5–15.5)
WBC: 9.7 10*3/uL (ref 4.0–10.5)
nRBC: 0 % (ref 0.0–0.2)

## 2019-08-14 LAB — HEPATIC FUNCTION PANEL
ALT: 33 U/L (ref 0–44)
AST: 17 U/L (ref 15–41)
Albumin: 4 g/dL (ref 3.5–5.0)
Alkaline Phosphatase: 68 U/L (ref 38–126)
Bilirubin, Direct: 0.1 mg/dL (ref 0.0–0.2)
Indirect Bilirubin: 0.4 mg/dL (ref 0.3–0.9)
Total Bilirubin: 0.5 mg/dL (ref 0.3–1.2)
Total Protein: 7.7 g/dL (ref 6.5–8.1)

## 2019-08-14 LAB — BASIC METABOLIC PANEL
Anion gap: 9 (ref 5–15)
BUN: 18 mg/dL (ref 6–20)
CO2: 29 mmol/L (ref 22–32)
Calcium: 9.3 mg/dL (ref 8.9–10.3)
Chloride: 105 mmol/L (ref 98–111)
Creatinine, Ser: 1.1 mg/dL — ABNORMAL HIGH (ref 0.44–1.00)
GFR calc Af Amer: >60 mL/min (ref >60)
GFR calc non Af Amer: 55 mL/min — ABNORMAL LOW (ref >60)
Glucose, Bld: 120 mg/dL — ABNORMAL HIGH (ref 70–99)
Potassium: 3.8 mmol/L (ref 3.5–5.1)
Sodium: 143 mmol/L (ref 135–145)

## 2019-08-14 LAB — URINALYSIS, ROUTINE W REFLEX MICROSCOPIC
Bilirubin Urine: NEGATIVE
Glucose, UA: 50 mg/dL — AB
Hgb urine dipstick: NEGATIVE
Ketones, ur: NEGATIVE mg/dL
Leukocytes,Ua: NEGATIVE
Nitrite: NEGATIVE
Protein, ur: NEGATIVE mg/dL
Specific Gravity, Urine: 1.012 (ref 1.005–1.030)
pH: 5 (ref 5.0–8.0)

## 2019-08-14 LAB — TROPONIN I (HIGH SENSITIVITY): Troponin I (High Sensitivity): 9 ng/L (ref <18)

## 2019-08-14 LAB — CBG MONITORING, ED: Glucose-Capillary: 111 mg/dL — ABNORMAL HIGH (ref 70–99)

## 2019-08-14 LAB — D-DIMER, QUANTITATIVE: D-Dimer, Quant: 1.53 ug/mL-FEU — ABNORMAL HIGH (ref 0.00–0.50)

## 2019-08-14 LAB — LIPASE, BLOOD: Lipase: 46 U/L (ref 11–51)

## 2019-08-14 MED ORDER — SODIUM CHLORIDE (PF) 0.9 % IJ SOLN
INTRAMUSCULAR | Status: AC
  Filled 2019-08-14: qty 50

## 2019-08-14 MED ORDER — IOHEXOL 350 MG/ML SOLN
100.0000 mL | Freq: Once | INTRAVENOUS | Status: AC | PRN
  Administered 2019-08-14: 100 mL via INTRAVENOUS

## 2019-08-14 MED ORDER — ONDANSETRON 4 MG PO TBDP
4.0000 mg | ORAL_TABLET | Freq: Once | ORAL | Status: AC
  Administered 2019-08-14: 4 mg via ORAL
  Filled 2019-08-14: qty 1

## 2019-08-14 MED ORDER — SODIUM CHLORIDE 0.9% FLUSH: 3.0000 mL | Freq: Once | INTRAVENOUS | Status: DC

## 2019-08-14 NOTE — ED Triage Notes (Addendum)
Patient complaining of right buttocks and leg pain. Patient states that she has been throwing up since Sunday. Patient has a hx of DVT's and PE's per patient.

## 2019-08-14 NOTE — ED Provider Notes (Signed)
[facility] DEPT Provider Note   CSN: [id number] Arrival date & time: 08/14/19  1945     History Chief Complaint  Patient presents with  . Leg Pain  . Dizziness  . Emesis    [name] is a 59 y.o. female.  The history is provided by the patient and medical records.  Leg Pain Dizziness Associated symptoms: vomiting   Emesis   59 y.o. 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  1.  Diarrhea-- had this for about 48 hours Monday-Wednesday, has since subsided but now feels nauseated.  She has been able to eat/drink, but is less than normal.  She has not had any vomiting.  No fever/chills.  No sick contacts.  2.  Right leg pain-- states this began Wednesday afternoon.  States she feels it into her butt and into right posterior thigh.  Pain is worse with movement.  States it feels dull and aching.  She denies any noted leg swelling, redness, warmth to touch.  She does have hx of DVT/PE-- cannot recall if there was anything provoking this.  She denies recent travel (however note in chart from 07/17/19 reports family trip).  She is not currently on exogenous estrogens.  Denies any chest pain or SOB but does report she feels some pain in her right arm.  Does have hx of CHF.  Denies weight gain, ankle edema, etc.  Past Medical History:  Diagnosis Date  . Anginal pain ([place])    a. NL cath in 2008;  b. Myoview 03/2011: dec uptake along mid anterior wall on stress imaging -> ? attenuation vs. ischemia, EF 65%;  c. Echo 04/2011: EF 55-60%, no RWMA, Gr 2 dd  . Anxiety   . Arthritis   . Asthma   . Back pain   . Bone cancer ([place])   . Cancer ([place])   . Chest pain   . CHF (congestive heart failure) ([place])   . CHF (congestive heart failure) ([place])   . Depression   . Diabetes mellitus   . Drug use     . Dyspnea    with exertion  . Dyspnea   . Frequent urination   . GERD (gastroesophageal reflux disease)   . Glaucoma   . HLD (hyperlipidemia)   . Hypertension   . IBS (irritable bowel syndrome)   . Joint pain   . Lactose intolerance   . Leg edema   . 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 Obesity   . Palpitations   . Pneumonia 05/2016   double  . Pulmonary edema   . Pulmonary embolism ([place])    a. 2008 -> coumadin x 6 mos.  . Rheumatoid arthritis ([place])   . Sleep apnea    on CPAP 02/2018  . TIA (transient ischemic attack)   . Urinary urgency     Patient Active Problem List   Diagnosis Date Noted  . Unilateral primary osteoarthritis, left hip   . Hip arthritis 06/11/2018  . Class 3 severe obesity with serious comorbidity and body mass index (BMI) of 40.0 to 44.9 in adult ([place]) 05/29/2018  . Other fatigue 11/20/2017  . Shortness of breath on exertion 11/20/2017  . Type 2 diabetes mellitus without complication, without long-term current use of insulin ([place]) 11/20/2017  . Vitamin D deficiency 11/20/2017  .  Depression 11/20/2017  . Other hyperlipidemia 11/20/2017  . 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 Presence of left artificial knee joint 09/20/2016  . Arthritis of knee 08/22/2016  . Cervical radiculopathy 07/26/2016  . Cervical disc disorder with radiculopathy 07/26/2016  . Right arm pain 06/29/2016  . Cervicalgia 06/29/2016  . Primary osteoarthritis of left knee 06/29/2016  . Hypersomnia with sleep apnea 11/18/2015  . Lethargy 11/18/2015  . Chest pain 09/30/2014  . Abnormal cardiac function test 09/29/2014  . Chest pain with moderate risk for cardiac etiology 09/28/2014  . HTN (hypertension) 09/28/2014  . Hypokalemia   . Paresthesia 06/18/2014  . Numbness and tingling of left arm and leg   . Unstable angina ([place]) 05/24/2014  . OSA on CPAP 05/24/2014  . Diabetes mellitus type 2 in obese ([place]) 10/29/2012  . S/P laparoscopic appendectomy  06/04/2012  . Diastolic CHF ([place]) [id number]  . Mediastinal mass 01/09/2012  . SOB (shortness of breath) 06/20/2011  . Mediastinal abnormality 06/20/2011  . Dyslipidemia 12/23/2009  . GLAUCOMA 12/23/2009  . ARTHRITIS 12/23/2009  . 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 Class 2 severe obesity with serious comorbidity and body mass index (BMI) of 38.0 to 38.9 in adult ([place]) 09/13/2006  . ANXIETY STATE NOS 09/13/2006  . DISORDER, DEPRESSIVE NEC 09/13/2006  . CARPAL TUNNEL SYNDROME, MILD 09/13/2006  . Unspecified essential hypertension 09/13/2006  . IBS 09/13/2006  . DEGENERATION, LUMBAR/LUMBOSACRAL DISC 09/13/2006  . SYMPTOM, SWELLING/MASS/LUMP IN CHEST 09/13/2006  . PULMONARY EMBOLISM, HX OF 09/13/2006    Past Surgical History:  Procedure Laterality Date  . ABDOMINAL HYSTERECTOMY  2005  . APPENDECTOMY    . CARDIAC CATHETERIZATION     Normal  . CARDIAC CATHETERIZATION N/A 09/30/2014   Procedure: Left Heart Cath and Coronary Angiography;  Surgeon: Sherren Mocha, MD;  Location: [facility];  Service: Cardiovascular;  Laterality: N/A;  . LAPAROSCOPIC APPENDECTOMY N/A 06/03/2012   Procedure: APPENDECTOMY LAPAROSCOPIC;  Surgeon: Stark Klein, MD;  Location: [facility];  Service: General;  Laterality: N/A;  . Left knee surgery  2008  . LEG SURGERY    . TONSILLECTOMY    . TOTAL HIP ARTHROPLASTY Left 06/11/2018   Procedure: LEFT TOTAL HIP ARTHROPLASTY ANTERIOR APPROACH;  Surgeon: Meredith Pel, MD;  Location: [facility];  Service: Orthopedics;  Laterality: Left;  . TOTAL KNEE ARTHROPLASTY Left 08/22/2016   Procedure: TOTAL KNEE ARTHROPLASTY;  Surgeon: Meredith Pel, MD;  Location: [facility];  Service: Orthopedics;  Laterality: Left;  . TUBAL LIGATION  1989     OB History    Gravida  3   Para      Term      Preterm      AB      Living        SAB      TAB      Ectopic      Multiple      Live Births              Family History  Problem Relation Age of Onset  .  Emphysema Mother   . Arthritis Mother   . Heart failure Mother        alive @ 27  . Stroke Mother   . Diabetes Mother   . Hypertension Mother   . Hyperlipidemia Mother   . Depression Mother   . Anxiety disorder Mother   . Asthma Brother   . Heart disease Father        died @  21's.  . Stroke Father   . Diabetes Father   . Hyperlipidemia Father   . Hypertension Father   . Bipolar disorder Father   . Sleep apnea Father   . Alcoholism Father   . Drug abuse Father   . Diabetes Sister   . Heart disease Paternal Grandfather   . Colon cancer Maternal Grandfather   . Breast cancer Maternal Aunt     Social History   Tobacco Use  . Smoking status: Former Smoker    Packs/day: 0.50    Years: 15.00    Pack years: 7.50    Types: Cigarettes    Quit date: 04/24/1985    Years since quitting: 34.3  . Smokeless tobacco: Never Used  Substance Use Topics  . Alcohol use: No    Alcohol/week: 0.0 standard drinks  . Drug use: Not Currently    Types: Marijuana    Home Medications Prior to Admission medications   Medication Sig Start Date End Date Taking? Authorizing Provider  albuterol (PROVENTIL HFA;VENTOLIN HFA) 108 (90 Base) MCG/ACT inhaler Inhale 2 puffs into the lungs every 6 (six) hours as needed for wheezing or shortness of breath.    [provider]  aspirin 81 MG chewable tablet Chew 81 mg by mouth daily.    [provider]  atorvastatin (LIPITOR) 40 MG tablet Take 40 mg by mouth at bedtime.     [provider]  carvedilol (COREG CR) 10 MG 24 hr capsule Take 10 mg by mouth daily.    [provider]  diclofenac sodium (VOLTAREN) 1 % GEL Apply 2 g topically 4 (four) times daily. 02/05/19   [provider]  escitalopram (LEXAPRO) 20 MG tablet Take 20 mg by mouth daily. 02/21/19   [provider]  furosemide (LASIX) 40 MG tablet Take 40 mg by mouth daily.    Rai, Vernelle Emerald, MD  hydrOXYzine (VISTARIL) 25 MG capsule Take 25 mg by  mouth 2 (two) times daily as needed for itching. 01/06/19   [provider]  isosorbide dinitrate (ISORDIL) 20 MG tablet Take 20 mg by mouth 2 (two) times daily.     [provider]  JANUMET XR 50-500 MG TB24 TAKE 1 TABLET BY MOUTH EVERY DAY 08/14/19   Whitmire, Arrie Aran W, FNP  lisinopril (PRINIVIL,ZESTRIL) 20 MG tablet Take 20 mg by mouth daily.  09/21/14   [provider]  loperamide (IMODIUM A-D) 2 MG tablet Take 4 mg by mouth 4 (four) times daily as needed for diarrhea or loose stools.     [provider]  MYRBETRIQ 25 MG TB24 tablet Take 1 tablet (25 mg total) by mouth daily. 03/13/19   Dennard Nip D, MD  omeprazole (PRILOSEC) 20 MG capsule Take 20 mg by mouth 2 (two) times daily. 10/05/16   [provider]  potassium chloride (KLOR-CON M10) 10 MEQ tablet Take 1 tablet (10 mEq total) by mouth 2 (two) times daily. 05/27/18   Jearld Lesch A, DO  sertraline (ZOLOFT) 100 MG tablet Take 100 mg by mouth daily.     [provider]  Vaginal Moisturizer ([employer] FEMININE MOISTURIZER) GEL Place 1 applicator vaginally every three (3) days as needed (vaginal dryness).  02/21/19   [provider]  Vitamin D, Ergocalciferol, (DRISDOL) 1.25 MG (50000 UNIT) CAPS capsule Take 1 capsule (50,000 Units total) by mouth every 7 (seven) days. 07/17/19   Whitmire, Dawn W, FNP  KOMBIGLYZE XR 5-500 MG TB24 Take 1 tablet by mouth daily.  10/11/15  11/26/18  [provider]    Allergies    Penicillins, Hydrocodone-acetaminophen, Latex, and Sulfa antibiotics  Review of Systems   Review of Systems  Gastrointestinal: Positive for vomiting.  Neurological: Positive for dizziness.    Physical Exam Updated Vital Signs BP 114/69 (BP Location: Right Arm)   Pulse 86   Temp 98.9 F (37.2 C) (Oral)   Resp 16   Ht 4\' 11"  (1.499 m)   Wt 99.8 kg   SpO2 97%   BMI 44.43 kg/m   Physical Exam Vitals and nursing note reviewed.  Constitutional:      Appearance: She  is well-developed.  HENT:     Head: Normocephalic and atraumatic.  Eyes:     Conjunctiva/sclera: Conjunctivae normal.     Pupils: Pupils are equal, round, and reactive to light.  Cardiovascular:     Rate and Rhythm: Normal rate and regular rhythm.     Heart sounds: Normal heart sounds.  Pulmonary:     Effort: Pulmonary effort is normal.     Breath sounds: Normal breath sounds.  Abdominal:     General: Bowel sounds are normal.     Palpations: Abdomen is soft.  Musculoskeletal:        General: Normal range of motion.     Cervical back: Normal range of motion.     Comments: Right leg normal in appearance without any noted asymmetry, tenderness, or palpable cords, no overlying erythema, warmth to touch, or other skin findings; there is a + SLR on right; DP pulses intact BLE No significant peripheral edema  Skin:    General: Skin is warm and dry.  Neurological:     Mental Status: She is alert and oriented to person, place, and time.     ED Results / Procedures / Treatments   Labs (all labs ordered are listed, but only abnormal results are displayed) Labs Reviewed  BASIC METABOLIC PANEL - Abnormal; Notable for the following components:      Result Value   Glucose, Bld 120 (*)    Creatinine, Ser 1.10 (*)    GFR calc non Af Amer 55 (*)    All other components within normal limits  URINALYSIS, ROUTINE W REFLEX MICROSCOPIC - Abnormal; Notable for the following components:   APPearance HAZY (*)    Glucose, UA 50 (*)    All other components within normal limits  D-DIMER, QUANTITATIVE (NOT AT [facility]) - Abnormal; Notable for the following components:   D-Dimer, Quant 1.53 (*)    All other components within normal limits  CBG MONITORING, ED - Abnormal; Notable for the following components:   Glucose-Capillary 111 (*)    All other components within normal limits  CBC  LIPASE, BLOOD  HEPATIC FUNCTION PANEL  TROPONIN I (HIGH SENSITIVITY)    EKG None  Radiology CT Angio Chest PE W  and/or Wo Contrast  Result Date: 08/14/2019 CLINICAL DATA:  Chest and lower extremity pain, not anticoagulated, emesis since Sunday EXAM: CT ANGIOGRAPHY CHEST WITH CONTRAST TECHNIQUE: Multidetector CT imaging of the chest was performed using the standard protocol during bolus administration of intravenous contrast. Multiplanar CT image reconstructions and MIPs were obtained to evaluate the vascular anatomy. CONTRAST:  130mL OMNIPAQUE IOHEXOL 350 MG/ML SOLN COMPARISON:  CT angiography 03/05/2019 FINDINGS: Cardiovascular: Borderline opacification of the central pulmonary arteries and extensive respiratory motion artifact may limit detection of smaller segmental and subsegmental pulmonary artery filling defects. No large central or lobar filling defects are identified. Central pulmonary arteries are enlarged  though similar to the comparison exam 03/05/2019. Stable mild cardiomegaly. The aorta is normal caliber. Shared origin of the brachiocephalic and left common carotid arteries. Proximal great vessels are otherwise unremarkable. Mediastinum/Nodes: Enlarged, heterogeneous and multinodular thyroid including a nodule which extends inferiorly from the right thyroid lobe into the thoracic inlet measuring up to 2.3 cm in size. No mediastinal fluid or gas. No acute abnormality of the trachea or esophagus. No worrisome mediastinal, hilar or axillary adenopathy. Lungs/Pleura: Lung volumes are low with streaky areas of atelectasis most pronounced towards the lung bases. More bandlike areas of opacity could reflect subsegmental atelectasis most notably within the lingula. No consolidation, features of edema, pneumothorax, or effusion. No suspicious pulmonary nodules or masses are visible within the limitations of detection imposed by respiratory motion artifact. Upper Abdomen: No acute abnormalities present in the visualized portions of the upper abdomen. Musculoskeletal: No acute osseous abnormality or suspicious osseous  lesion. Multilevel degenerative changes are present in the imaged portions of the spine. Review of the MIP images confirms the above findings. IMPRESSION: 1. Borderline opacification of the central pulmonary arteries and extensive respiratory motion artifact may limit detection of smaller segmental and subsegmental pulmonary artery filling defects. No large central or lobar filling defects are identified. 2. Central pulmonary arteries are enlarged though similar to the comparison exam 03/05/2019, likely reflecting underlying pulmonary artery hypertension. 3. Low lung volumes with streaky areas of atelectasis most pronounced towards the lung bases. No other acute cardiopulmonary abnormality. 4. Enlarged, heterogeneous and multinodular thyroid including a nodule which extends inferiorly from the right thyroid lobe into the thoracic inlet measuring up to 2.3 cm in size. Consider further evaluation with nonemergent outpatient thyroid ultrasound. This follows consensus guidelines: Managing Incidental Thyroid Nodules Detected on Imaging: White Paper of the ACR Incidental Thyroid Findings Committee. J Am Coll Radiol 2015; 12:143-150. and [facility] 3-tiered system for managing ITNs: J Am Coll Radiol. 2015; Feb;12(2): 143-50 Electronically Signed   By: Lovena Le M.D.   On: 08/14/2019 23:56    Procedures Procedures (including critical care time)  Medications Ordered in ED Medications  sodium chloride flush (NS) 0.9 % injection 3 mL (0 mLs Intravenous Hold 08/14/19 2217)  ondansetron (ZOFRAN-ODT) disintegrating tablet 4 mg (4 mg Oral Given 08/14/19 2221)  iohexol (OMNIPAQUE) 350 MG/ML injection 100 mL (100 mLs Intravenous Contrast Given 08/14/19 2333)  sodium chloride (PF) 0.9 % injection (  Given by Other 08/14/19 2359)  enoxaparin (LOVENOX) injection 100 mg (100 mg Subcutaneous Given 08/15/19 0119)    ED Course  I have reviewed the triage vital signs and the nursing notes.  Pertinent labs & imaging results that  were available during my care of the patient were reviewed by me and considered in my medical decision making (see chart for details).    MDM Rules/Calculators/A&P  59 year old female presenting to the ED with multiple complaints.  She did have diarrhea earlier in the week but that is since resolved.  Has had some ongoing nausea but denies vomiting.  P.o. intake has been slightly less than normal.  She is afebrile and nontoxic in appearance here.  Now her biggest complaint is right leg pain.  States it is in her right buttock and right posterior thigh.  She does report history of DVT and PE.  She has no asymmetry, tenderness, or palpable cords on exam, however she does have a positive straight leg raise on right.  Both legs are neurovascularly intact without strength or sensory deficit.  No bowel or  bladder incontinence.  She does not report she is having chest pain, does have some vague sensations in her right upper arm.  She is not having any shortness of breath.  No tachycardia or hypoxia here.  Labs obtained from triage including D-dimer, grossly reassuring but D-dimer is elevated.  She appears to have chronically elevated D-dimers.  Given her symptoms and history, will obtain CTA.  She was given Zofran for nausea.  CTA somewhat motion degraded but no evidence of large PE.  Does have incidental finding of thyroid nodule.  Patient sleeping on reassessment, had to be awoken.  She appears comfortable.  Vitals remained stable on room air.  Remains without tachycardia and hypoxia.  Even with motion degraded exam, low suspicion for PE.  With her right leg pain and history, she is still at risk for possible DVT.  With her positive straight leg raise however, it may be more radicular in nature.  Will give dose of Lovenox and arrange for venous duplex to be completed in the morning as this is unavailable at this hour.  Plan was discussed with patient and she is very comfortable with this.  I have made her aware of  findings of thyroid nodule and need for OP [name].  She will follow-up with her primary care doctor.  May return here for any new/acute changes.  Final Clinical Impression(s) / ED Diagnoses Final diagnoses:  Right leg pain  Nausea    Rx / DC Orders ED Discharge Orders         Ordered    LE VENOUS     08/15/19 0046           Larene Pickett, PA-C 08/15/19 0141    Molpus, Jenny Reichmann, MD 08/15/19 [phone]

## 2019-08-14 NOTE — ED Notes (Signed)
Urine Culture sent with specimen

## 2019-08-14 NOTE — ED Notes (Signed)
Lab contacted to add on hepatic and troponin.

## 2019-08-15 ENCOUNTER — Ambulatory Visit (HOSPITAL_COMMUNITY): Payer: Medicare Other

## 2019-08-15 DIAGNOSIS — M79604 Pain in right leg: Secondary | ICD-10-CM | POA: Diagnosis not present

## 2019-08-15 MED ORDER — ENOXAPARIN SODIUM 100 MG/ML ~~LOC~~ SOLN
100.0000 mg | Freq: Once | SUBCUTANEOUS | Status: AC
  Administered 2019-08-15: 100 mg via SUBCUTANEOUS
  Filled 2019-08-15: qty 1

## 2019-08-15 NOTE — Discharge Instructions (Addendum)
Return in the morning to have the doppler ultrasound of your leg.  You do not need to check back into the ER, go to the imaging department. Follow-up with your primary care doctor. There was a noted thyroid nodule on your imaging today, your primary care doctor will need to arrange ultrasound of this. Return here for any new/acute changes.

## 2019-08-18 ENCOUNTER — Other Ambulatory Visit: Payer: Self-pay

## 2019-08-18 ENCOUNTER — Ambulatory Visit (HOSPITAL_COMMUNITY)
Admission: RE | Admit: 2019-08-18 | Discharge: 2019-08-18 | Disposition: A | Discharge status: Home / Self Care | Payer: Medicare Other | Source: Ambulatory Visit | Attending: Emergency Medicine | Admitting: Emergency Medicine

## 2019-08-18 DIAGNOSIS — M79604 Pain in right leg: Secondary | ICD-10-CM | POA: Diagnosis not present

## 2019-08-18 DIAGNOSIS — M79609 Pain in unspecified limb: Secondary | ICD-10-CM

## 2019-08-18 NOTE — Progress Notes (Signed)
Lower extremity venous has been completed.   Preliminary results in CV Proc.   Abram Sander 08/18/2019 11:35 AM

## 2019-08-27 ENCOUNTER — Ambulatory Visit (INDEPENDENT_AMBULATORY_CARE_PROVIDER_SITE_OTHER): Payer: Medicare Other | Admitting: Family Medicine

## 2019-08-27 ENCOUNTER — Encounter (INDEPENDENT_AMBULATORY_CARE_PROVIDER_SITE_OTHER): Payer: Self-pay | Admitting: Family Medicine

## 2019-08-27 ENCOUNTER — Other Ambulatory Visit: Payer: Self-pay

## 2019-08-27 VITALS — BP 117/64 | HR 74 | Temp 97.9°F | Ht 59.0 in | Wt 224.0 lb

## 2019-08-27 DIAGNOSIS — Z6841 Body Mass Index (BMI) 40.0 and over, adult: Secondary | ICD-10-CM

## 2019-08-27 DIAGNOSIS — E559 Vitamin D deficiency, unspecified: Secondary | ICD-10-CM | POA: Diagnosis not present

## 2019-08-27 DIAGNOSIS — E1169 Type 2 diabetes mellitus with other specified complication: Secondary | ICD-10-CM | POA: Diagnosis not present

## 2019-08-27 DIAGNOSIS — F3289 Other specified depressive episodes: Secondary | ICD-10-CM | POA: Diagnosis not present

## 2019-08-27 DIAGNOSIS — E119 Type 2 diabetes mellitus without complications: Secondary | ICD-10-CM

## 2019-08-27 MED ORDER — JANUMET XR 50-500 MG PO TB24: 1.0000 | ORAL_TABLET | Freq: Every day | ORAL | 0 refills | Status: DC

## 2019-08-27 MED ORDER — VITAMIN D (ERGOCALCIFEROL) 1.25 MG (50000 UNIT) PO CAPS: 50000.0000 [IU] | ORAL_CAPSULE | ORAL | 0 refills | Status: DC

## 2019-08-27 NOTE — Progress Notes (Signed)
Chief Complaint:   OBESITY Santa is here to discuss her progress with her obesity treatment plan along with follow-up of her obesity related diagnoses. Toyoko is on the Category 2 Plan and states she is following her eating plan approximately 70% of the time. Teagin states she is walking for 30 minutes 2-3 times per week.  Today's visit was #: 79 Starting weight: 225 lbs Starting date: 11/20/2017 Today's weight: 224 lbs Today's date: 08/28/2019 Total lbs lost to date: 1 Total lbs lost since last in-office visit: 0  Interim History: Gurkirat has not had an office visit with [name] since 07/17/2019. She was helping her family with a new baby and tried to stick to the plan, but she had to eat what was available. She often skips breakfast and dinner (about 3 times per week). She has a protein shake for a meal some days.  Subjective:   1. Vitamin D deficiency Inza's last Vit D level was low at 36.9. She is on high dose Vit D.  2. Type 2 diabetes mellitus without complication, without long-term current use of insulin (HCC) Kerrianne's last A1c was 7.0. Her CBGs range between 98 and 130. She notes occasional hypoglycemia when she does not eat enough.  3. Other depression with emotional eating  Teasia notes poor sleep, and she feels she is depressed. She denies suicidal ideas or homicidal ideas. She has made some calls to get setup with a Psychiatrist but she has not heard back. She notes fatigue.  Assessment/Plan:   1. Vitamin D deficiency Low Vitamin D level contributes to fatigue and are associated with obesity, breast, and colon cancer. We will refill prescription Vitamin D for 1 month. Norvell will follow-up for routine testing of Vitamin D, at least 2-3 times per year to avoid over-replacement. We will recheck labs at her next visit.  - Vitamin D, Ergocalciferol, (DRISDOL) 1.25 MG (50000 UNIT) CAPS capsule; Take 1 capsule (50,000 Units total) by mouth every 7 (seven) days.  Dispense: 4  capsule; Refill: 0  2. Type 2 diabetes mellitus without complication, without long-term current use of insulin (HCC) Good blood sugar control is important to decrease the likelihood of diabetic complications such as nephropathy, neuropathy, limb loss, blindness, coronary artery disease, and death. Intensive lifestyle modification including diet, exercise and weight loss are the first line of treatment for diabetes. We will refill Janumet for 90 days with no refills, and we will recheck labs at [place] next visit.  - SitaGLIPtin-MetFORMIN HCl (JANUMET XR) 50-500 MG TB24; Take 1 tablet by mouth daily.  Dispense: 90 tablet; Refill: 0  3. Other depression with emotional eating   Johann will try to call Psychiatry again. If she does not set an appointment, then we will put in a referral at her next office visit.   4. Class 3 severe obesity with serious comorbidity and body mass index (BMI) of 45.0 to 49.9 in adult, unspecified obesity type (HCC) Layleen is currently in the action stage of change. As such, her goal is to continue with weight loss efforts. She has agreed to the Category 2 Plan.   Exercise goals: All adults should avoid inactivity. Some physical activity is better than none, and adults who participate in any amount of physical activity gain some health benefits.  Behavioral modification strategies: increasing lean protein intake and no skipping meals.  Rihonna has agreed to follow-up with our clinic in 3 weeks. She was informed of the importance of frequent follow-up visits to maximize  her success with intensive lifestyle modifications for her multiple health conditions.   Objective:   Blood pressure 117/64, pulse 74, temperature 97.9 F (36.6 C), temperature source Oral, height 4\' 11"  (1.499 m), weight 224 lb (101.6 kg), SpO2 97 %. Body mass index is 45.24 kg/m.  General: Cooperative, alert, well developed, in no acute distress. HEENT: Conjunctivae and lids  unremarkable. Cardiovascular: Regular rhythm.  Lungs: Normal work of breathing. Neurologic: No focal deficits.   Lab Results  Component Value Date   CREATININE 1.10 (H) 08/14/2019   BUN 18 08/14/2019   NA 143 08/14/2019   K 3.8 08/14/2019   CL 105 08/14/2019   CO2 29 08/14/2019   Lab Results  Component Value Date   ALT 33 08/14/2019   AST 17 08/14/2019   ALKPHOS 68 08/14/2019   BILITOT 0.5 08/14/2019   Lab Results  Component Value Date   HGBA1C 6.3 (H) 12/26/2018   HGBA1C 6.6 (H) 05/22/2018   HGBA1C 6.8 (H) 03/04/2018   HGBA1C 8.8 (H) 11/20/2017   HGBA1C 7.4 (H) 08/11/2016   Lab Results  Component Value Date   INSULIN 28.1 (H) 12/26/2018   INSULIN 20.2 03/04/2018   INSULIN 53.9 (H) 11/20/2017   Lab Results  Component Value Date   TSH 0.458 03/05/2019   Lab Results  Component Value Date   CHOL 119 12/26/2018   HDL 58 12/26/2018   LDLCALC 41 12/26/2018   TRIG 108 12/26/2018   CHOLHDL 4.2 04/23/2011   CHOLHDL 4.3 04/23/2011   Lab Results  Component Value Date   WBC 9.7 08/14/2019   HGB 12.8 08/14/2019   HCT 40.4 08/14/2019   MCV 88.2 08/14/2019   PLT 243 08/14/2019   No results found for: IRON, TIBC, FERRITIN  Obesity Behavioral Intervention Documentation for Insurance:   Approximately 15 minutes were spent on the discussion below.  ASK: We discussed the diagnosis of obesity with [name] today and Shlonda agreed to give [name] permission to discuss obesity behavioral modification therapy today.  ASSESS: Amyah has the diagnosis of obesity and her BMI today is 45.22. Darelene is in the action stage of change.   ADVISE: Jerald was educated on the multiple health risks of obesity as well as the benefit of weight loss to improve her health. She was advised of the need for long term treatment and the importance of lifestyle modifications to improve her current health and to decrease her risk of future health problems.  AGREE: Multiple dietary modification  options and treatment options were discussed and Sydnei agreed to follow the recommendations documented in the above note.  ARRANGE: Alicen was educated on the importance of frequent visits to treat obesity as outlined per CMS and USPSTF guidelines and agreed to schedule her next follow up appointment today.  Attestation Statements:   Reviewed by clinician on day of visit: allergies, medications, problem list, medical history, surgical history, family history, social history, and previous encounter notes.   Wilhemena Durie, am acting as Location manager for [employer], FNP-C.  I have reviewed the above documentation for accuracy and completeness, and I agree with the above. -  Georgianne Fick, FNP

## 2019-08-28 ENCOUNTER — Encounter (INDEPENDENT_AMBULATORY_CARE_PROVIDER_SITE_OTHER): Payer: Self-pay | Admitting: Family Medicine

## 2019-09-01 IMAGING — MR MR LUMBAR SPINE W/O CM
4 of 5 series · 25 of 48 positions shown · non-contrast
Comparison: Plain films lumbar spine 04/27/2014.

CLINICAL DATA: Worsening chronic low back pain radiating into the
left leg.

EXAM:
MRI LUMBAR SPINE WITHOUT CONTRAST
TECHNIQUE: Multiplanar, multisequence MR imaging of the lumbar spine was
performed. No intravenous contrast was administered.

[Series 2: T2 · sagittal · 4.0mm · 0.55mm/px · 6 of 13 slices shown (1 of 2)]
[im 1/13]
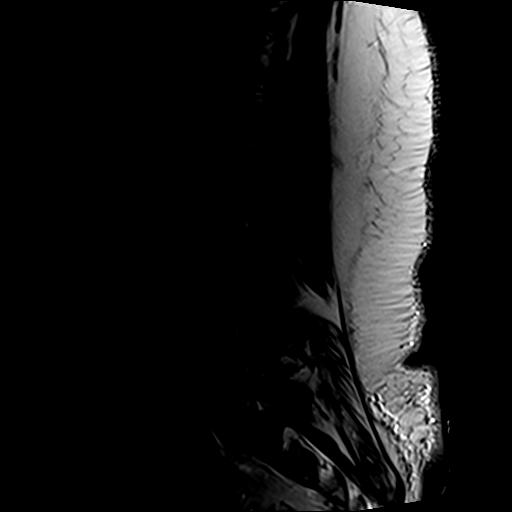
[im 3/13]
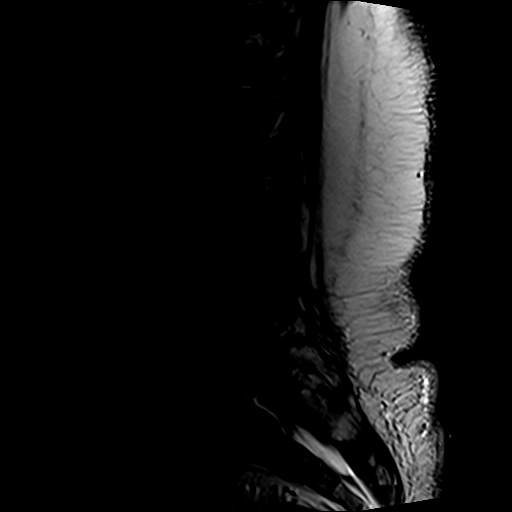
[im 5/13]
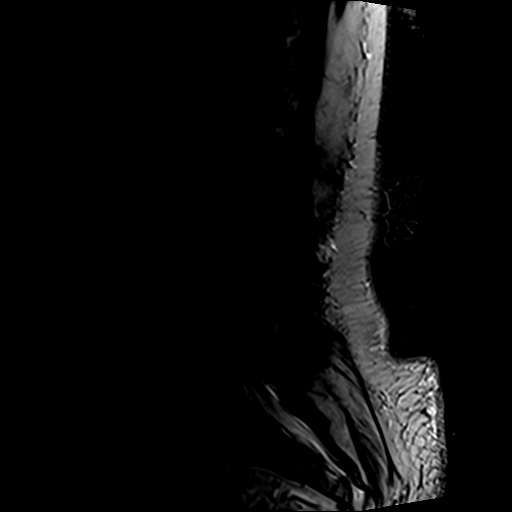
[im 8/13]
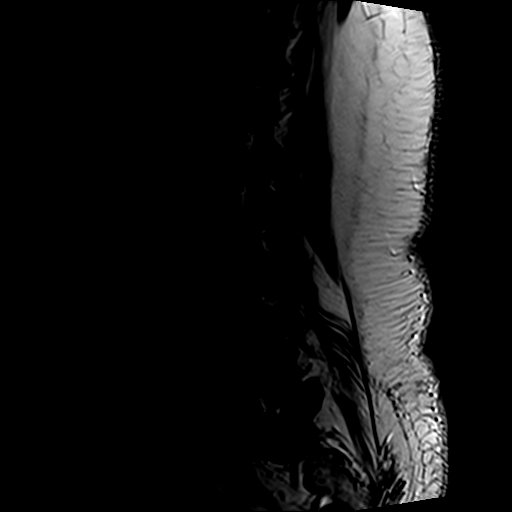
[im 10/13]
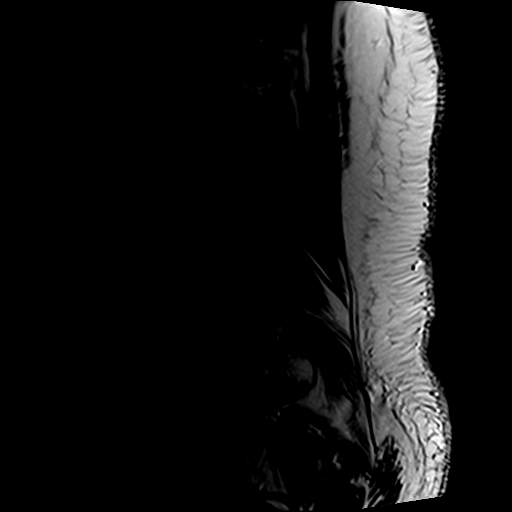
[im 13/13]
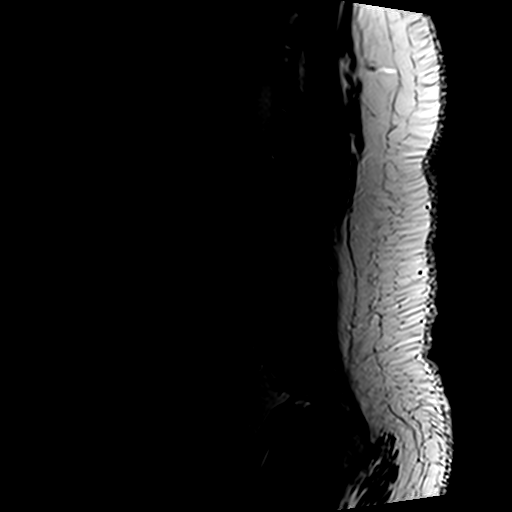

[Series 4: T1 · sagittal · 4.0mm · 0.55mm/px · 6 of 13 slices shown (1 of 2)]
[im 1/13]
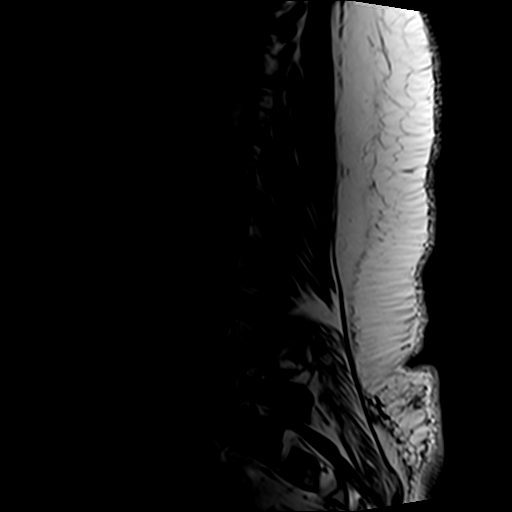
[im 3/13]
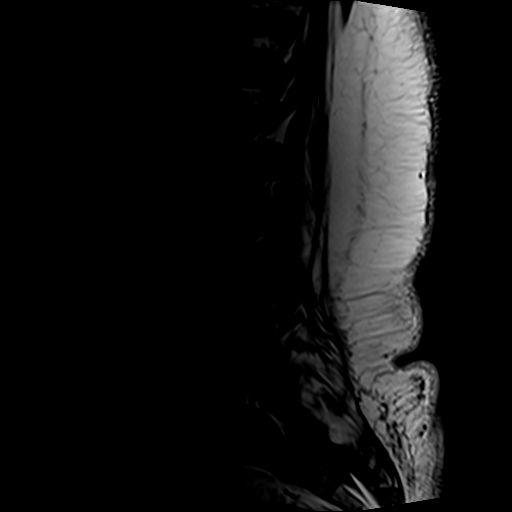
[im 5/13]
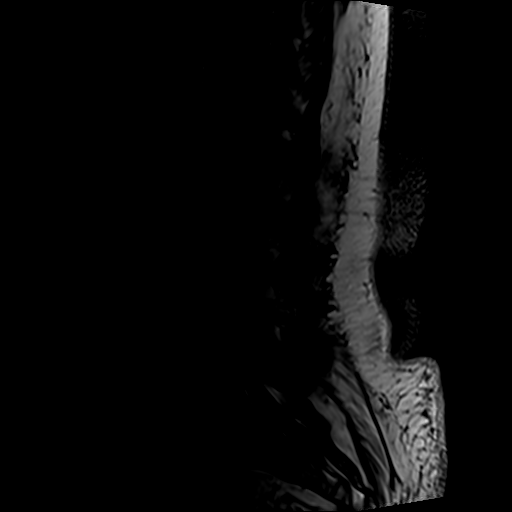
[im 8/13]
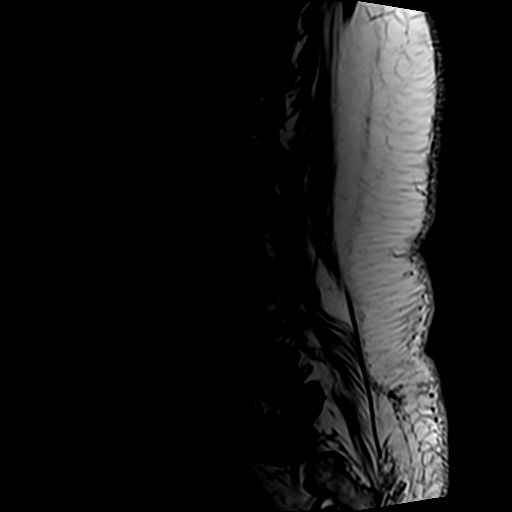
[im 10/13]
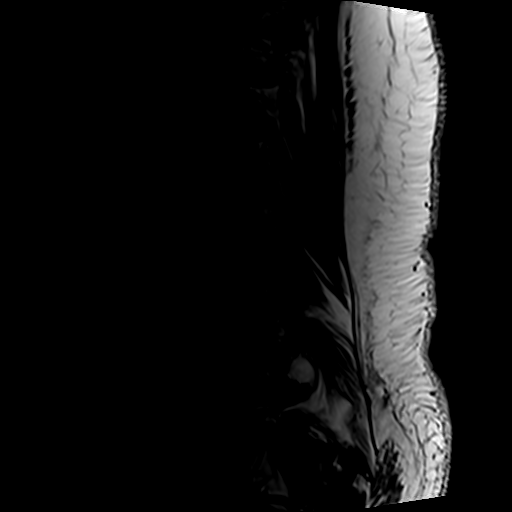
[im 13/13]
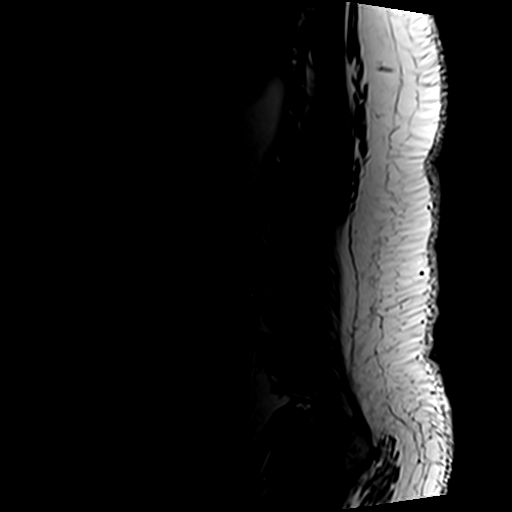

[Series 5: T2 · axial · 4.0mm · 0.70mm/px · z∈[-59,+123]mm · 9 of 33 slices shown (2 of 2)]
[im 1/33]
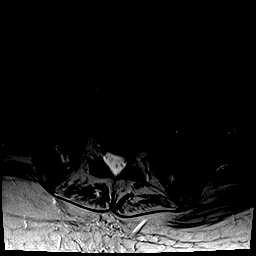
[im 5/33]
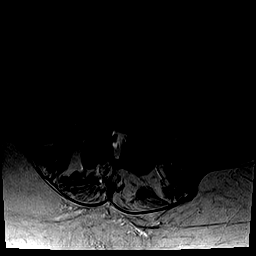
[im 10/33]
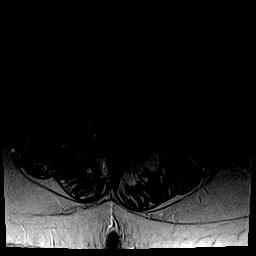
[im 14/33]
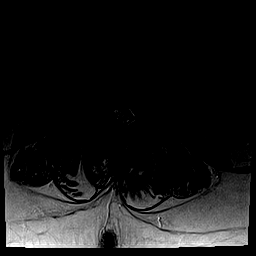
[im 17/33]
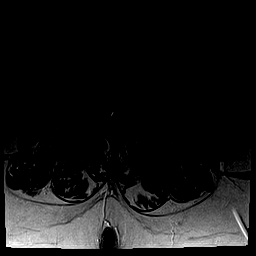
[im 19/33]
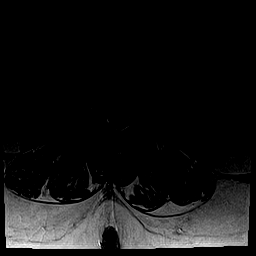
[im 23/33]
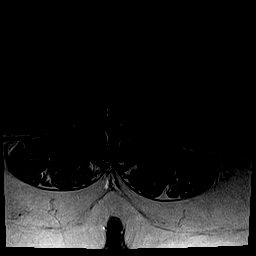
[im 28/33]
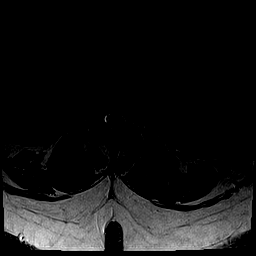
[im 33/33]
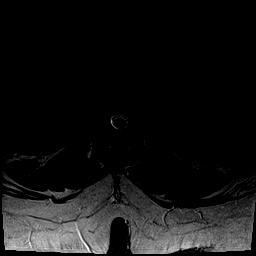

[Series 6: T1 · axial · 4.0mm · 0.35mm/px · z∈[-59,+79]mm · 4 of 33 slices shown (2 of 2)]
[im 1/33]
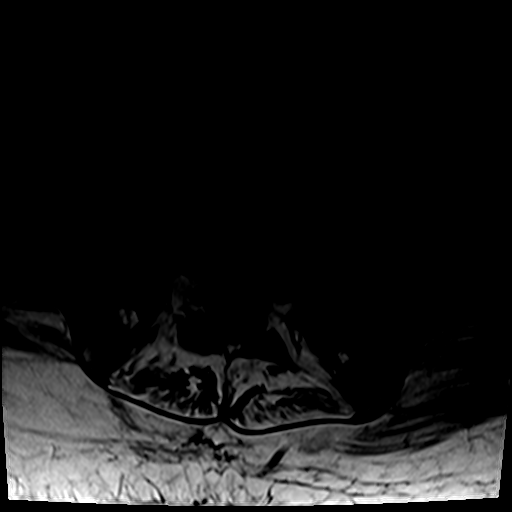
[im 5/33]
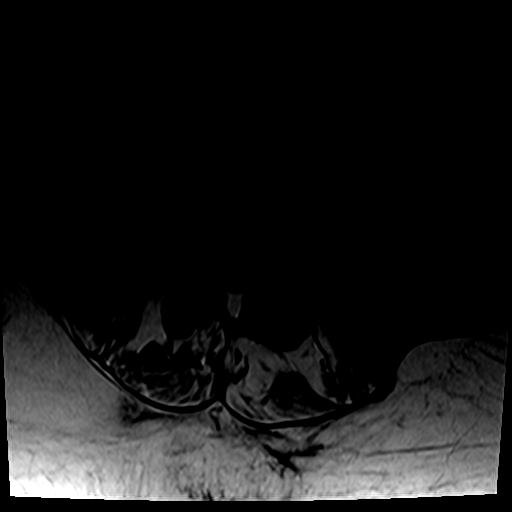
[im 17/33]
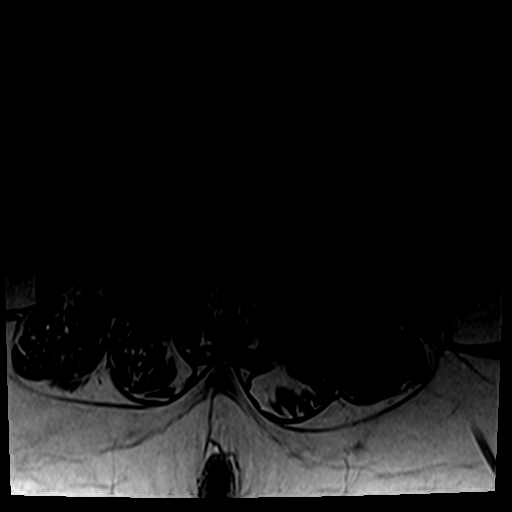
[im 28/33]
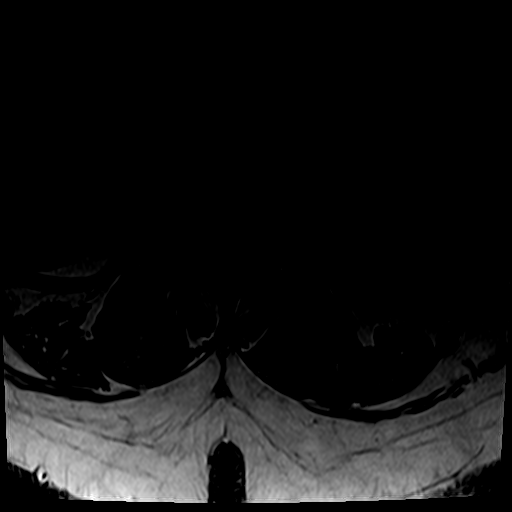

[25 of 48 positions shown; findings below may reference images not displayed]

FINDINGS: Segmentation:  Standard.

Alignment: Facet degenerative disease results in 0.3 cm
anterolisthesis L4 on L5 and 0.6 cm anterolisthesis L5 on S1. Convex
left scoliosis is noted.

Vertebrae:  No fracture or worrisome marrow lesion.

Conus medullaris and cauda equina: Conus extends to the L2 level.
Conus and cauda equina appear normal.

Paraspinal and other soft tissues: Negative.

Disc levels:

The T9-10, T10-11 and T11-12 are imaged in the sagittal plane only.
The central canal and foramina appear open at each level with mild
disc bulging seen at T10-11 and T11-12.

T12-L1: Moderate facet arthropathy. Left paravertebral endplate
spur. No central canal or foraminal stenosis.

L1-2: Moderate facet arthropathy.  Otherwise negative.

L2-3: Moderate facet arthropathy. Dorsal epidural fat is prominent.
The central canal and foramina are open.

L3-4: Moderate facet arthropathy. Dorsal and left-side epidural fat
is prominent. The central canal and right foramen are open. Mild
left foraminal narrowing noted.

L4-5: The disc is uncovered with a shallow bulge. There is advanced
bilateral facet arthropathy and bulky ligamentum flavum thickening.
Epidural fat is somewhat prominent. Moderate to moderately severe
compression of the thecal sac is identified. Mild to moderate
foraminal narrowing is worse on the left.

L5-S1: Advanced bilateral facet degenerative disease. The disc is
uncovered without bulging. The central canal and foramina are open.
IMPRESSION: Bulky ligamentum flavum thickening, shallow disc bulge and prominent
epidural fat cause moderate to moderately severe compression of the
thecal sac at L4-5. 0.3 cm facet mediated anterolisthesis is seen at
this level.

Advanced facet arthropathy at L5-S1 results in 0.6 cm
anterolisthesis. The central canal and foramina are open.

## 2019-09-03 ENCOUNTER — Telehealth: Payer: Self-pay | Admitting: Orthopedic Surgery

## 2019-09-03 NOTE — Telephone Encounter (Signed)
Pls advise. Thanks.  

## 2019-09-03 NOTE — Telephone Encounter (Signed)
Patient called. She would like something called in for her arthritis. Her call back number is [phone]

## 2019-09-04 NOTE — Telephone Encounter (Signed)
Tried calling to discuss. No answer. LMVM advising per Lurena Joiner and to call office to schedule appt

## 2019-09-04 NOTE — Telephone Encounter (Signed)
Creatinine too high to take NSAIDs.  Don't want to prescribe narcotics for arthritic pain.  Recommend she schedule appointment for evaluation of pain and options for treatment.

## 2019-09-05 ENCOUNTER — Other Ambulatory Visit: Payer: Self-pay

## 2019-09-05 ENCOUNTER — Ambulatory Visit (INDEPENDENT_AMBULATORY_CARE_PROVIDER_SITE_OTHER): Payer: Medicare Other | Admitting: Orthopedic Surgery

## 2019-09-05 ENCOUNTER — Encounter: Payer: Self-pay | Admitting: Orthopedic Surgery

## 2019-09-05 DIAGNOSIS — M4802 Spinal stenosis, cervical region: Secondary | ICD-10-CM

## 2019-09-05 DIAGNOSIS — M48061 Spinal stenosis, lumbar region without neurogenic claudication: Secondary | ICD-10-CM

## 2019-09-05 MED ORDER — MELOXICAM 15 MG PO TABS: ORAL_TABLET | ORAL | 1 refills | Status: DC

## 2019-09-06 ENCOUNTER — Encounter: Payer: Self-pay | Admitting: Orthopedic Surgery

## 2019-09-10 ENCOUNTER — Telehealth: Payer: Self-pay

## 2019-09-10 NOTE — Telephone Encounter (Signed)
Erica at Dr. Eligha Bridegroom dentist office would like pre-dental note/letter faxed to [phone].  CB# [phone].  Please advise.  Thank you

## 2019-09-11 NOTE — Telephone Encounter (Signed)
Note generated and faxed.  °

## 2019-09-13 ENCOUNTER — Other Ambulatory Visit (INDEPENDENT_AMBULATORY_CARE_PROVIDER_SITE_OTHER): Payer: Self-pay | Admitting: Family Medicine

## 2019-09-13 DIAGNOSIS — E559 Vitamin D deficiency, unspecified: Secondary | ICD-10-CM

## 2019-09-17 ENCOUNTER — Encounter (INDEPENDENT_AMBULATORY_CARE_PROVIDER_SITE_OTHER): Payer: Self-pay | Admitting: Family Medicine

## 2019-09-17 ENCOUNTER — Other Ambulatory Visit: Payer: Self-pay

## 2019-09-17 ENCOUNTER — Ambulatory Visit (INDEPENDENT_AMBULATORY_CARE_PROVIDER_SITE_OTHER): Payer: Medicare Other | Admitting: Family Medicine

## 2019-09-17 VITALS — BP 100/67 | HR 72 | Temp 98.4°F | Ht 59.0 in | Wt 220.0 lb

## 2019-09-17 DIAGNOSIS — E1169 Type 2 diabetes mellitus with other specified complication: Secondary | ICD-10-CM

## 2019-09-17 DIAGNOSIS — E785 Hyperlipidemia, unspecified: Secondary | ICD-10-CM | POA: Diagnosis not present

## 2019-09-17 DIAGNOSIS — E559 Vitamin D deficiency, unspecified: Secondary | ICD-10-CM | POA: Diagnosis not present

## 2019-09-17 DIAGNOSIS — Z6841 Body Mass Index (BMI) 40.0 and over, adult: Secondary | ICD-10-CM

## 2019-09-17 MED ORDER — VITAMIN D (ERGOCALCIFEROL) 1.25 MG (50000 UNIT) PO CAPS: 50000.0000 [IU] | ORAL_CAPSULE | ORAL | 0 refills | Status: DC

## 2019-09-17 NOTE — Progress Notes (Signed)
Chief Complaint:   OBESITY [name] is here to discuss her progress with her obesity treatment plan along with follow-up of her obesity related diagnoses. [name] is on the Category 2 Plan and states she is following her eating plan approximately 70% of the time. [name] states she is doing 0 minutes 0 times per week.  Today's visit was #: 28 Starting weight: 225 lbs Starting date: 11/20/2017 Today's weight: 220 lbs Today's date: 09/17/2019 Total lbs lost to date: 5 Total lbs lost since last in-office visit: 4  Interim History: [name] is doing better and not skipping as many meals. She is eating her protein. She substitutes a protein shake for breakfast at times.  Subjective:   1. Type 2 diabetes mellitus without complication, without long-term current use of insulin (HCC) [name]'s last A1c was 7.0. He diabetes mellitus is well controlled on sitagliptin-metformin. Her CBGs are ranging from 120's to 169. She denies hypoglycemia.  Lab Results  Component Value Date   HGBA1C 6.3 (H) 12/26/2018   HGBA1C 6.6 (H) 05/22/2018   HGBA1C 6.8 (H) 03/04/2018   Lab Results  Component Value Date   MICROALBUR 0.51 03/01/2010   LDLCALC 41 12/26/2018   CREATININE 1.10 (H) 08/14/2019   Lab Results  Component Value Date   INSULIN 28.1 (H) 12/26/2018   INSULIN 20.2 03/04/2018   INSULIN 53.9 (H) 11/20/2017   2. Other hyperlipidemia [name] has hyperlipidemia and has been trying to improve her cholesterol levels with intensive lifestyle modification including a low saturated fat diet, exercise and weight loss. [name] is on statin, and last LDL was within normal limits. She denies any chest pain, claudication or myalgias.  Lab Results  Component Value Date   ALT 33 08/14/2019   AST 17 08/14/2019   ALKPHOS 68 08/14/2019   BILITOT 0.5 08/14/2019   Lab Results  Component Value Date   CHOL 119 12/26/2018   HDL 58 12/26/2018   LDLCALC 41 12/26/2018   TRIG 108 12/26/2018   CHOLHDL 4.2  04/23/2011   CHOLHDL 4.3 04/23/2011   3. Vitamin D deficiency [name]'s last Vit D level was low at 36.9. She is currently on prescription Vit D.  Assessment/Plan:   1. Type 2 diabetes mellitus without complication, without long-term current use of insulin (HCC) Good blood sugar control is important to decrease the likelihood of diabetic complications such as nephropathy, neuropathy, limb loss, blindness, coronary artery disease, and death. Intensive lifestyle modification including diet, exercise and weight loss are the first line of treatment for diabetes. [name] will continue her medications, and we will check labs today.  - Hemoglobin A1c  2. Other hyperlipidemia Cardiovascular risk and specific lipid/LDL goals reviewed. We discussed several lifestyle modifications today and [name] will continue to work on diet, exercise and weight loss efforts. We will check labs today.  - Lipid Panel With LDL/HDL Ratio  3. Vitamin D deficiency Low Vitamin D level contributes to fatigue and are associated with obesity, breast, and colon cancer. We will refill prescription Vitamin D for 1 month. [name] will follow-up for routine testing of Vitamin D, at least 2-3 times per year to avoid over-replacement. We will check labs today.  - VITAMIN D 25 Hydroxy (Vit-D Deficiency, Fractures)  - Vitamin D, Ergocalciferol, (DRISDOL) 1.25 MG (50000 UNIT) CAPS capsule; Take 1 capsule (50,000 Units total) by mouth every 7 (seven) days.  Dispense: 4 capsule; Refill: 0  4. Class 3 severe obesity with serious comorbidity and body mass index (BMI) of 40.0 to  44.9 in adult, unspecified obesity type ([place]) [name] is currently in the action stage of change. As such, her goal is to continue with weight loss efforts. She has agreed to the Category 2 Plan.   Exercise goals: Older adults with chronic conditions should understand whether and how their conditions affect their ability to do regular physical activity  safely.  Behavioral modification strategies: increasing lean protein intake and no skipping meals.  [name] has agreed to follow-up with our clinic in 3 weeks. She was informed of the importance of frequent follow-up visits to maximize her success with intensive lifestyle modifications for her multiple health conditions.   [name] was informed we would discuss her lab results at her next visit unless there is a critical issue that needs to be addressed sooner. [name] agreed to keep her next visit at the agreed upon time to discuss these results.  Objective:   Blood pressure 100/67, pulse 72, temperature 98.4 F (36.9 C), temperature source Oral, height 4\' 11"  (1.499 m), weight 220 lb (99.8 kg), SpO2 96 %. Body mass index is 44.43 kg/m.  General: Cooperative, alert, well developed, in no acute distress. HEENT: Conjunctivae and lids unremarkable. Cardiovascular: Regular rhythm.  Lungs: Normal work of breathing. Neurologic: No focal deficits.   Lab Results  Component Value Date   CREATININE 1.10 (H) 08/14/2019   BUN 18 08/14/2019   NA 143 08/14/2019   K 3.8 08/14/2019   CL 105 08/14/2019   CO2 29 08/14/2019   Lab Results  Component Value Date   ALT 33 08/14/2019   AST 17 08/14/2019   ALKPHOS 68 08/14/2019   BILITOT 0.5 08/14/2019   Lab Results  Component Value Date   HGBA1C 6.3 (H) 12/26/2018   HGBA1C 6.6 (H) 05/22/2018   HGBA1C 6.8 (H) 03/04/2018   HGBA1C 8.8 (H) 11/20/2017   HGBA1C 7.4 (H) 08/11/2016   Lab Results  Component Value Date   INSULIN 28.1 (H) 12/26/2018   INSULIN 20.2 03/04/2018   INSULIN 53.9 (H) 11/20/2017   Lab Results  Component Value Date   TSH 0.458 03/05/2019   Lab Results  Component Value Date   CHOL 119 12/26/2018   HDL 58 12/26/2018   LDLCALC 41 12/26/2018   TRIG 108 12/26/2018   CHOLHDL 4.2 04/23/2011   CHOLHDL 4.3 04/23/2011   Lab Results  Component Value Date   WBC 9.7 08/14/2019   HGB 12.8 08/14/2019   HCT 40.4 08/14/2019    MCV 88.2 08/14/2019   PLT 243 08/14/2019   No results found for: IRON, TIBC, FERRITIN  Obesity Behavioral Intervention Documentation for Insurance:   Approximately 15 minutes were spent on the discussion below.  ASK: We discussed the diagnosis of obesity with [name] today and [name] agreed to give [name] permission to discuss obesity behavioral modification therapy today.  ASSESS: [name] has the diagnosis of obesity and her BMI today is 44.41. [name] is in the action stage of change.   ADVISE: [name] was educated on the multiple health risks of obesity as well as the benefit of weight loss to improve her health. She was advised of the need for long term treatment and the importance of lifestyle modifications to improve her current health and to decrease her risk of future health problems.  AGREE: Multiple dietary modification options and treatment options were discussed and [name] agreed to follow the recommendations documented in the above note.  ARRANGE: [name] was educated on the importance of frequent visits to treat obesity as outlined per CMS  and USPSTF guidelines and agreed to schedule her next follow up appointment today.  Attestation Statements:   Reviewed by clinician on day of visit: allergies, medications, problem list, medical history, surgical history, family history, social history, and previous encounter notes.   Wilhemena Durie, am acting as Location manager for [employer], FNP-C.  I have reviewed the above documentation for accuracy and completeness, and I agree with the above. -  Georgianne Fick, FNP

## 2019-09-18 ENCOUNTER — Encounter (INDEPENDENT_AMBULATORY_CARE_PROVIDER_SITE_OTHER): Payer: Self-pay | Admitting: Family Medicine

## 2019-09-24 NOTE — Progress Notes (Signed)
Office Visit Note   Patient: [name]           Date of Birth: [birth year]           MRN: [id number] Visit Date: 09/05/2019 Requested by: [name]., FNP [place],  [place] [id number] PCP: [name]., FNP  Subjective: Chief Complaint  Patient presents with  . Left Leg - Pain  . Right Leg - Pain    HPI: [name] is a 59 year old patient who is done well with her left total knee replacement.  She is having some mild right knee pain but it is manageable.  She also is having some neck pain without any fevers or chills.  This is been going on for over 8 weeks.  Denies any weakness.              ROS: All systems reviewed are negative as they relate to the chief complaint within the history of present illness.  Patient denies  fevers or chills.   Assessment & Plan: Visit Diagnoses:  1. Spinal stenosis of cervical region   2. Spinal stenosis of lumbar region, unspecified whether neurogenic claudication present     Plan: Impression is possible cervical spine stenosis and radiculopathy.  Been going on for over 6 weeks with failure of conservative management.  Plan is cervical spine MRI with Mobic.  Also send her to physical therapy to work on knee range of motion and strengthening as well as cervical spine traction.  Follow-up after the MRI scan.  May need  ESI's at that time.  Follow-Up Instructions: Return for after MRI.   Orders:  Orders Placed This Encounter  Procedures  . MR Cervical Spine w/o contrast  . Ambulatory referral to Physical Medicine Rehab   Meds ordered this encounter  Medications  . meloxicam (MOBIC) 15 MG tablet    Sig: 1 po q d prn    Dispense:  30 tablet    Refill:  1      Procedures: No procedures performed   Clinical Data: No additional findings.  Objective: Vital Signs: There were no vitals taken for this visit.  Physical Exam:   Constitutional: Patient appears well-developed HEENT:  Head:  Normocephalic Eyes:EOM are normal Neck: Normal range of motion Cardiovascular: Normal rate Pulmonary/chest: Effort normal Neurologic: Patient is alert Skin: Skin is warm Psychiatric: Patient has normal mood and affect    Ortho Exam: Ortho exam demonstrates reasonable cervical spine range of motion.  5 out of 5 grip EPL FPL interosseous are flexion extension bicep triceps and deltoid strength.  No definite paresthesias C5-T1.  Reflex symmetric.  Radial pulse intact bilaterally.  Left knee has good range of motion and strength.  Right knee no effusion and no groin pain with internal extra rotation of either leg.  Specialty Comments:  No specialty comments available.  Imaging: No results found.   PMFS History: Patient Active Problem List   Diagnosis Date Noted  . Unilateral primary osteoarthritis, left hip   . Hip arthritis 06/11/2018  . Class 3 severe obesity with serious comorbidity and body mass index (BMI) of 40.0 to 44.9 in adult ([place]) 05/29/2018  . Other fatigue 11/20/2017  . Shortness of breath on exertion 11/20/2017  . Type 2 diabetes mellitus without complication, without long-term current use of insulin ([place]) 11/20/2017  . Vitamin D deficiency 11/20/2017  . Depression 11/20/2017  . Other hyperlipidemia 11/20/2017  . 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 Presence of  left artificial knee joint 09/20/2016  . Arthritis of knee 08/22/2016  . Cervical radiculopathy 07/26/2016  . Cervical disc disorder with radiculopathy 07/26/2016  . Right arm pain 06/29/2016  . Cervicalgia 06/29/2016  . Primary osteoarthritis of left knee 06/29/2016  . Hypersomnia with sleep apnea 11/18/2015  . Lethargy 11/18/2015  . Chest pain 09/30/2014  . Abnormal cardiac function test 09/29/2014  . Chest pain with moderate risk for cardiac etiology 09/28/2014  . HTN (hypertension) 09/28/2014  . Hypokalemia   . Paresthesia 06/18/2014  . Numbness and tingling of left arm and leg   . Unstable  angina ([place]) 05/24/2014  . OSA on CPAP 05/24/2014  . Diabetes mellitus type 2 in obese ([place]) 10/29/2012  . S/P laparoscopic appendectomy 06/04/2012  . Diastolic CHF ([place]) [id number]  . Mediastinal mass 01/09/2012  . SOB (shortness of breath) 06/20/2011  . Mediastinal abnormality 06/20/2011  . Dyslipidemia 12/23/2009  . GLAUCOMA 12/23/2009  . ARTHRITIS 12/23/2009  . 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 Class 2 severe obesity with serious comorbidity and body mass index (BMI) of 38.0 to 38.9 in adult ([place]) 09/13/2006  . ANXIETY STATE NOS 09/13/2006  . DISORDER, DEPRESSIVE NEC 09/13/2006  . CARPAL TUNNEL SYNDROME, MILD 09/13/2006  . Unspecified essential hypertension 09/13/2006  . IBS 09/13/2006  . DEGENERATION, LUMBAR/LUMBOSACRAL DISC 09/13/2006  . SYMPTOM, SWELLING/MASS/LUMP IN CHEST 09/13/2006  . PULMONARY EMBOLISM, HX OF 09/13/2006   Past Medical History:  Diagnosis Date  . Anginal pain ([place])    a. NL cath in 2008;  b. Myoview 03/2011: dec uptake along mid anterior wall on stress imaging -> ? attenuation vs. ischemia, EF 65%;  c. Echo 04/2011: EF 55-60%, no RWMA, Gr 2 dd  . Anxiety   . Arthritis   . Asthma   . Back pain   . Bone cancer ([place])   . Cancer ([place])   . Chest pain   . CHF (congestive heart failure) ([place])   . CHF (congestive heart failure) ([place])   . Depression   . Diabetes mellitus   . Drug use   . Dyspnea    with exertion  . Dyspnea   . Frequent urination   . GERD (gastroesophageal reflux disease)   . Glaucoma   . HLD (hyperlipidemia)   . Hypertension   . IBS (irritable bowel syndrome)   . Joint pain   . Lactose intolerance   . Leg edema   . 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 Obesity   . Palpitations   . Pneumonia 05/2016   double  . Pulmonary edema   . Pulmonary embolism ([place])    a. 2008 -> coumadin x 6 mos.  . Rheumatoid arthritis ([place])   . Sleep apnea    on CPAP 02/2018  . TIA (transient ischemic attack)   . Urinary urgency      Family History  Problem Relation Age of Onset  . Emphysema Mother   . Arthritis Mother   . Heart failure Mother        alive @ 61  . Stroke Mother   . Diabetes Mother   . Hypertension Mother   . Hyperlipidemia Mother   . Depression Mother   . Anxiety disorder Mother   . Asthma Brother   . Heart disease Father        died @ 53's.  . Stroke Father   . Diabetes Father   . Hyperlipidemia Father   . Hypertension Father   .  Bipolar disorder Father   . Sleep apnea Father   . Alcoholism Father   . Drug abuse Father   . Diabetes Sister   . Heart disease Paternal Grandfather   . Colon cancer Maternal Grandfather   . Breast cancer Maternal Aunt     Past Surgical History:  Procedure Laterality Date  . ABDOMINAL HYSTERECTOMY  2005  . APPENDECTOMY    . CARDIAC CATHETERIZATION     Normal  . CARDIAC CATHETERIZATION N/A 09/30/2014   Procedure: Left Heart Cath and Coronary Angiography;  Surgeon: Sherren Mocha, MD;  Location: [facility];  Service: Cardiovascular;  Laterality: N/A;  . LAPAROSCOPIC APPENDECTOMY N/A 06/03/2012   Procedure: APPENDECTOMY LAPAROSCOPIC;  Surgeon: Stark Klein, MD;  Location: [facility];  Service: General;  Laterality: N/A;  . Left knee surgery  2008  . LEG SURGERY    . TONSILLECTOMY    . TOTAL HIP ARTHROPLASTY Left 06/11/2018   Procedure: LEFT TOTAL HIP ARTHROPLASTY ANTERIOR APPROACH;  Surgeon: Meredith Pel, MD;  Location: [facility];  Service: Orthopedics;  Laterality: Left;  . TOTAL KNEE ARTHROPLASTY Left 08/22/2016   Procedure: TOTAL KNEE ARTHROPLASTY;  Surgeon: Meredith Pel, MD;  Location: [facility];  Service: Orthopedics;  Laterality: Left;  . TUBAL LIGATION  1989   Social History   Occupational History  . Occupation: UNEMPLOYED    Employer: DISABLED  Tobacco Use  . Smoking status: Former Smoker    Packs/day: 0.50    Years: 15.00    Pack years: 7.50    Types: Cigarettes    Quit date: 04/24/1985    Years since quitting: 34.4  . Smokeless  tobacco: Never Used  Substance and Sexual Activity  . Alcohol use: No    Alcohol/week: 0.0 standard drinks  . Drug use: Not Currently    Types: Marijuana  . Sexual activity: Never

## 2019-09-25 ENCOUNTER — Encounter (HOSPITAL_COMMUNITY): Payer: Self-pay | Admitting: Emergency Medicine

## 2019-09-25 ENCOUNTER — Emergency Department (HOSPITAL_COMMUNITY): Payer: Medicare Other

## 2019-09-25 ENCOUNTER — Other Ambulatory Visit: Payer: Self-pay

## 2019-09-25 ENCOUNTER — Emergency Department (HOSPITAL_COMMUNITY)
Admission: EM | Admit: 2019-09-25 | Discharge: 2019-09-25 | Disposition: A | Discharge status: Home / Self Care | Payer: Medicare Other | Attending: Emergency Medicine | Admitting: Emergency Medicine

## 2019-09-25 DIAGNOSIS — Z87891 Personal history of nicotine dependence: Secondary | ICD-10-CM | POA: Diagnosis not present

## 2019-09-25 DIAGNOSIS — B349 Viral infection, unspecified: Secondary | ICD-10-CM | POA: Diagnosis not present

## 2019-09-25 DIAGNOSIS — J029 Acute pharyngitis, unspecified: Secondary | ICD-10-CM | POA: Diagnosis not present

## 2019-09-25 DIAGNOSIS — Z20822 Contact with and (suspected) exposure to covid-19: Secondary | ICD-10-CM | POA: Diagnosis not present

## 2019-09-25 DIAGNOSIS — Z79899 Other long term (current) drug therapy: Secondary | ICD-10-CM | POA: Insufficient documentation

## 2019-09-25 DIAGNOSIS — Z86711 Personal history of pulmonary embolism: Secondary | ICD-10-CM | POA: Diagnosis not present

## 2019-09-25 DIAGNOSIS — E119 Type 2 diabetes mellitus without complications: Secondary | ICD-10-CM | POA: Insufficient documentation

## 2019-09-25 DIAGNOSIS — E785 Hyperlipidemia, unspecified: Secondary | ICD-10-CM | POA: Insufficient documentation

## 2019-09-25 DIAGNOSIS — I5030 Unspecified diastolic (congestive) heart failure: Secondary | ICD-10-CM | POA: Diagnosis not present

## 2019-09-25 DIAGNOSIS — I110 Hypertensive heart disease with heart failure: Secondary | ICD-10-CM | POA: Insufficient documentation

## 2019-09-25 DIAGNOSIS — Z7984 Long term (current) use of oral hypoglycemic drugs: Secondary | ICD-10-CM | POA: Diagnosis not present

## 2019-09-25 DIAGNOSIS — R0602 Shortness of breath: Secondary | ICD-10-CM | POA: Diagnosis present

## 2019-09-25 DIAGNOSIS — R0789 Other chest pain: Secondary | ICD-10-CM | POA: Insufficient documentation

## 2019-09-25 LAB — CBC
HCT: 36.9 % (ref 36.0–46.0)
Hemoglobin: 11.7 g/dL — ABNORMAL LOW (ref 12.0–15.0)
MCH: 27.9 pg (ref 26.0–34.0)
MCHC: 31.7 g/dL (ref 30.0–36.0)
MCV: 87.9 fL (ref 80.0–100.0)
Platelets: 226 10*3/uL (ref 150–400)
RBC: 4.2 MIL/uL (ref 3.87–5.11)
RDW: 13.5 % (ref 11.5–15.5)
WBC: 5.3 10*3/uL (ref 4.0–10.5)
nRBC: 0 % (ref 0.0–0.2)

## 2019-09-25 LAB — BASIC METABOLIC PANEL
Anion gap: 9 (ref 5–15)
BUN: 14 mg/dL (ref 6–20)
CO2: 28 mmol/L (ref 22–32)
Calcium: 9.2 mg/dL (ref 8.9–10.3)
Chloride: 104 mmol/L (ref 98–111)
Creatinine, Ser: 0.93 mg/dL (ref 0.44–1.00)
GFR calc Af Amer: >60 mL/min (ref >60)
GFR calc non Af Amer: >60 mL/min (ref >60)
Glucose, Bld: 151 mg/dL — ABNORMAL HIGH (ref 70–99)
Potassium: 4 mmol/L (ref 3.5–5.1)
Sodium: 141 mmol/L (ref 135–145)

## 2019-09-25 LAB — D-DIMER, QUANTITATIVE: D-Dimer, Quant: 1.27 ug/mL-FEU — ABNORMAL HIGH (ref 0.00–0.50)

## 2019-09-25 LAB — TROPONIN I (HIGH SENSITIVITY): Troponin I (High Sensitivity): 7 ng/L (ref <18)

## 2019-09-25 LAB — SARS CORONAVIRUS 2 BY RT PCR (HOSPITAL ORDER, PERFORMED IN ~~LOC~~ HOSPITAL LAB): SARS Coronavirus 2: NEGATIVE

## 2019-09-25 MED ORDER — SODIUM CHLORIDE 0.9% FLUSH: 3.0000 mL | Freq: Once | INTRAVENOUS | Status: DC

## 2019-09-25 MED ORDER — IOHEXOL 350 MG/ML SOLN
100.0000 mL | Freq: Once | INTRAVENOUS | Status: AC | PRN
  Administered 2019-09-25: 100 mL via INTRAVENOUS

## 2019-09-25 MED ORDER — SODIUM CHLORIDE (PF) 0.9 % IJ SOLN
INTRAMUSCULAR | Status: AC
  Filled 2019-09-25: qty 50

## 2019-09-25 NOTE — Discharge Instructions (Signed)
Please follow up with your primary care doctor in 3 days in the office.  I suspect you are having a viral syndrome.  I recommended rest and drinking water and fluids for the next few days.  Your covid test was negative.  Your EKG and cardiac (heart) blood tests were reassuring.  Your CT scan did NOT show evidence of blood clots in your lungs.  It is possible you are having some cervical angina, or chest pain due to nerve problems in your spine.

## 2019-09-25 NOTE — ED Triage Notes (Signed)
Pt c/o central chest pains that radiates to her back with SOB, sweats. Also having sore throat and dry cough since yesterday.

## 2019-09-25 NOTE — ED Provider Notes (Signed)
[facility] DEPT Provider Note   CSN: [id number] Arrival date & time: 09/25/19  1012     History Chief Complaint  Patient presents with  . Chest Pain  . Shortness of Breath  . Sore Throat    [name] is a 59 y.o. female history of diabetes, obesity, hypertension, pulmonary embolism formerly on anticoagulation (approx 8 years ago per her recollection, no longer on A/C for years), presented emergency department with multiple complaints.  Patient reports symptoms began with a sore throat yesterday.  Subsequently has developed fatigue, cough, congestion, chest pain and shortness of breath.  She reports that the chest pain and shortness of breath and cough began today.  She is feeling a pressure sensation near her epigastrium and also the right side of her chest which she feels also radiates with her back.  It is worse with movement.  It is worse with deep inspiration.  She is currently having mild 2 out of 10 chest pain.  She reports nausea but no vomiting today.  She denies any diarrhea.  She reports some subjective fevers and chills overnight.  She has not received her Covid vaccines.  She has not had any direct sick contacts in the house.  Unremarkable LHV in 2016.  Follows with DR Johnsie Cancel of cardiology Last echo 03/06/2019 with E [id number], mild diastolic dysfunction Unremarkable stress test on 04/11/2019 with no evidence of ischemic or infarct per Dr [name], MD  Negative DVT ultrasound of LE on 08/18/19 per chart review  HPI     Past Medical History:  Diagnosis Date  . Anginal pain ([place])    a. NL cath in 2008;  b. Myoview 03/2011: dec uptake along mid anterior wall on stress imaging -> ? attenuation vs. ischemia, EF 65%;  c. Echo 04/2011: EF 55-60%, no RWMA, Gr 2 dd  . Anxiety   . Arthritis   . Asthma   . Back pain   . Bone cancer ([place])   . Cancer ([place])   . Chest pain   . CHF (congestive heart failure) ([place])   . CHF (congestive heart  failure) ([place])   . Depression   . Diabetes mellitus   . Drug use   . Dyspnea    with exertion  . Dyspnea   . Frequent urination   . GERD (gastroesophageal reflux disease)   . Glaucoma   . HLD (hyperlipidemia)   . Hypertension   . IBS (irritable bowel syndrome)   . Joint pain   . Lactose intolerance   . Leg edema   . 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 Obesity   . Palpitations   . Pneumonia 05/2016   double  . Pulmonary edema   . Pulmonary embolism ([place])    a. 2008 -> coumadin x 6 mos.  . Rheumatoid arthritis ([place])   . Sleep apnea    on CPAP 02/2018  . TIA (transient ischemic attack)   . Urinary urgency     Patient Active Problem List   Diagnosis Date Noted  . Unilateral primary osteoarthritis, left hip   . Hip arthritis 06/11/2018  . Class 3 severe obesity with serious comorbidity and body mass index (BMI) of 40.0 to 44.9 in adult ([place]) 05/29/2018  . Other fatigue 11/20/2017  . Shortness of breath on exertion 11/20/2017  . Type 2 diabetes mellitus without complication, without long-term current use of insulin ([place]) 11/20/2017  . Vitamin D deficiency 11/20/2017  .  Depression 11/20/2017  . Other hyperlipidemia 11/20/2017  . 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 Presence of left artificial knee joint 09/20/2016  . Arthritis of knee 08/22/2016  . Cervical radiculopathy 07/26/2016  . Cervical disc disorder with radiculopathy 07/26/2016  . Right arm pain 06/29/2016  . Cervicalgia 06/29/2016  . Primary osteoarthritis of left knee 06/29/2016  . Hypersomnia with sleep apnea 11/18/2015  . Lethargy 11/18/2015  . Chest pain 09/30/2014  . Abnormal cardiac function test 09/29/2014  . Chest pain with moderate risk for cardiac etiology 09/28/2014  . HTN (hypertension) 09/28/2014  . Hypokalemia   . Paresthesia 06/18/2014  . Numbness and tingling of left arm and leg   . Unstable angina ([place]) 05/24/2014  . OSA on CPAP 05/24/2014  . Diabetes mellitus  type 2 in obese ([place]) 10/29/2012  . S/P laparoscopic appendectomy 06/04/2012  . Diastolic CHF ([place]) [id number]  . Mediastinal mass 01/09/2012  . SOB (shortness of breath) 06/20/2011  . Mediastinal abnormality 06/20/2011  . Dyslipidemia 12/23/2009  . GLAUCOMA 12/23/2009  . ARTHRITIS 12/23/2009  . 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 Class 2 severe obesity with serious comorbidity and body mass index (BMI) of 38.0 to 38.9 in adult ([place]) 09/13/2006  . ANXIETY STATE NOS 09/13/2006  . DISORDER, DEPRESSIVE NEC 09/13/2006  . CARPAL TUNNEL SYNDROME, MILD 09/13/2006  . Unspecified essential hypertension 09/13/2006  . IBS 09/13/2006  . DEGENERATION, LUMBAR/LUMBOSACRAL DISC 09/13/2006  . SYMPTOM, SWELLING/MASS/LUMP IN CHEST 09/13/2006  . PULMONARY EMBOLISM, HX OF 09/13/2006    Past Surgical History:  Procedure Laterality Date  . ABDOMINAL HYSTERECTOMY  2005  . APPENDECTOMY    . CARDIAC CATHETERIZATION     Normal  . CARDIAC CATHETERIZATION N/A 09/30/2014   Procedure: Left Heart Cath and Coronary Angiography;  Surgeon: Sherren Mocha, MD;  Location: [facility];  Service: Cardiovascular;  Laterality: N/A;  . LAPAROSCOPIC APPENDECTOMY N/A 06/03/2012   Procedure: APPENDECTOMY LAPAROSCOPIC;  Surgeon: Stark Klein, MD;  Location: [facility];  Service: General;  Laterality: N/A;  . Left knee surgery  2008  . LEG SURGERY    . TONSILLECTOMY    . TOTAL HIP ARTHROPLASTY Left 06/11/2018   Procedure: LEFT TOTAL HIP ARTHROPLASTY ANTERIOR APPROACH;  Surgeon: Meredith Pel, MD;  Location: [facility];  Service: Orthopedics;  Laterality: Left;  . TOTAL KNEE ARTHROPLASTY Left 08/22/2016   Procedure: TOTAL KNEE ARTHROPLASTY;  Surgeon: Meredith Pel, MD;  Location: [facility];  Service: Orthopedics;  Laterality: Left;  . TUBAL LIGATION  1989     OB History    Gravida  3   Para      Term      Preterm      AB      Living        SAB      TAB      Ectopic      Multiple      Live Births                Family History  Problem Relation Age of Onset  . Emphysema Mother   . Arthritis Mother   . Heart failure Mother        alive @ 25  . Stroke Mother   . Diabetes Mother   . Hypertension Mother   . Hyperlipidemia Mother   . Depression Mother   . Anxiety disorder Mother   . Asthma Brother   . Heart disease Father        died @  74's.  . Stroke Father   . Diabetes Father   . Hyperlipidemia Father   . Hypertension Father   . Bipolar disorder Father   . Sleep apnea Father   . Alcoholism Father   . Drug abuse Father   . Diabetes Sister   . Heart disease Paternal Grandfather   . Colon cancer Maternal Grandfather   . Breast cancer Maternal Aunt     Social History   Tobacco Use  . Smoking status: Former Smoker    Packs/day: 0.50    Years: 15.00    Pack years: 7.50    Types: Cigarettes    Quit date: 04/24/1985    Years since quitting: 34.4  . Smokeless tobacco: Never Used  Substance Use Topics  . Alcohol use: No    Alcohol/week: 0.0 standard drinks  . Drug use: Not Currently    Types: Marijuana    Home Medications Prior to Admission medications   Medication Sig Start Date End Date Taking? Authorizing Provider  albuterol (PROVENTIL HFA;VENTOLIN HFA) 108 (90 Base) MCG/ACT inhaler Inhale 2 puffs into the lungs every 6 (six) hours as needed for wheezing or shortness of breath.    [provider]  aspirin 81 MG chewable tablet Chew 81 mg by mouth daily.    [provider]  atorvastatin (LIPITOR) 40 MG tablet Take 40 mg by mouth at bedtime.     [provider]  carvedilol (COREG CR) 10 MG 24 hr capsule Take 10 mg by mouth daily.    [provider]  diclofenac sodium (VOLTAREN) 1 % GEL Apply 2 g topically 4 (four) times daily. 02/05/19   [provider]  escitalopram (LEXAPRO) 20 MG tablet Take 20 mg by mouth daily. 02/21/19   [provider]  furosemide (LASIX) 40 MG tablet Take 40 mg by mouth daily.    Rai, Vernelle Emerald,  MD  hydrOXYzine (VISTARIL) 25 MG capsule Take 25 mg by mouth 2 (two) times daily as needed for itching. 01/06/19   [provider]  isosorbide dinitrate (ISORDIL) 20 MG tablet Take 20 mg by mouth 2 (two) times daily.     [provider]  lisinopril (PRINIVIL,ZESTRIL) 20 MG tablet Take 20 mg by mouth daily.  09/21/14   [provider]  loperamide (IMODIUM A-D) 2 MG tablet Take 4 mg by mouth 4 (four) times daily as needed for diarrhea or loose stools.     [provider]  meloxicam (MOBIC) 15 MG tablet 1 po q d prn 09/05/19   Meredith Pel, MD  MYRBETRIQ 25 MG TB24 tablet Take 1 tablet (25 mg total) by mouth daily. 03/13/19   Dennard Nip D, MD  omeprazole (PRILOSEC) 20 MG capsule Take 20 mg by mouth 2 (two) times daily. 10/05/16   [provider]  potassium chloride (KLOR-CON M10) 10 MEQ tablet Take 1 tablet (10 mEq total) by mouth 2 (two) times daily. 05/27/18   Jearld Lesch A, DO  sertraline (ZOLOFT) 100 MG tablet Take 100 mg by mouth daily.     [provider]  SitaGLIPtin-MetFORMIN HCl (JANUMET XR) 50-500 MG TB24 Take 1 tablet by mouth daily. 08/27/19   Whitmire, Joneen Boers, FNP  Vaginal Moisturizer ([employer] FEMININE MOISTURIZER) GEL Place 1 applicator vaginally every three (3) days as needed (vaginal dryness).  02/21/19   [provider]  Vitamin D, Ergocalciferol, (DRISDOL) 1.25 MG (50000 UNIT) CAPS capsule Take 1 capsule (50,000 Units total) by mouth every 7 (seven) days. 09/17/19  Whitmire, Dawn W, FNP  KOMBIGLYZE XR 5-500 MG TB24 Take 1 tablet by mouth daily.  10/11/15 11/26/18  [provider]    Allergies    Penicillins, Hydrocodone-acetaminophen, Latex, and Sulfa antibiotics  Review of Systems   Review of Systems  Constitutional: Positive for chills and fever.  HENT: Positive for sore throat. Negative for ear pain.   Eyes: Negative for photophobia and visual disturbance.  Respiratory: Positive for shortness of breath.  Negative for cough.   Cardiovascular: Positive for chest pain. Negative for palpitations.  Gastrointestinal: Positive for nausea. Negative for abdominal pain and vomiting.  Genitourinary: Negative for dysuria and hematuria.  Musculoskeletal: Positive for arthralgias, back pain and myalgias.  Skin: Negative for color change and rash.  Neurological: Negative for syncope and headaches.  Psychiatric/Behavioral: Negative for agitation and confusion.  All other systems reviewed and are negative.   Physical Exam Updated Vital Signs BP 138/82   Pulse 65   Temp 97.7 F (36.5 C) (Oral)   Resp 16   SpO2 100%   Physical Exam Vitals and nursing note reviewed.  Constitutional:      General: She is not in acute distress.    Appearance: She is well-developed. She is obese.  HENT:     Head: Normocephalic and atraumatic.  Eyes:     Conjunctiva/sclera: Conjunctivae normal.  Cardiovascular:     Rate and Rhythm: Normal rate and regular rhythm.     Heart sounds: No murmur.  Pulmonary:     Effort: Pulmonary effort is normal. No respiratory distress.     Breath sounds: Normal breath sounds.  Abdominal:     Palpations: Abdomen is soft.     Tenderness: There is no abdominal tenderness.  Musculoskeletal:     Cervical back: Neck supple.     Comments: Chest pain reproducible with movement of the spine Paraspinal trapezius ttp  Skin:    General: Skin is warm and dry.  Neurological:     General: No focal deficit present.     Mental Status: She is alert and oriented to person, place, and time.  Psychiatric:        Mood and Affect: Mood normal.        Behavior: Behavior normal.     ED Results / Procedures / Treatments   Labs (all labs ordered are listed, but only abnormal results are displayed) Labs Reviewed  BASIC METABOLIC PANEL - Abnormal; Notable for the following components:      Result Value   Glucose, Bld 151 (*)    All other components within normal limits  CBC - Abnormal; Notable  for the following components:   Hemoglobin 11.7 (*)    All other components within normal limits  D-DIMER, QUANTITATIVE (NOT AT [facility]) - Abnormal; Notable for the following components:   D-Dimer, Quant 1.27 (*)    All other components within normal limits  SARS CORONAVIRUS 2 BY RT PCR [facility] ORDER, [facility])  TROPONIN I (HIGH SENSITIVITY)    EKG EKG Interpretation  Date/Time:  Thursday September 25 2019 11:36:41 EDT Ventricular Rate:  65 PR Interval:    QRS Duration: 126 QT Interval:  446 QTC Calculation: 464 R Axis:   71 Text Interpretation: Sinus rhythm Borderline prolonged PR interval Nonspecific intraventricular conduction delay Nonspecific T abnormalities, lateral leads No STEMI Confirmed by Octaviano Glow [phone]) on 09/25/2019 11:40:05 AM   Radiology DG Chest 2 View  Result Date: 09/25/2019 CLINICAL DATA:  Onset chest pain, shortness of  breath and cough yesterday. EXAM: CHEST - 2 VIEW COMPARISON:  PA and lateral chest 06/21/2019.  CT chest 08/14/2019. FINDINGS: Lungs are clear. Heart size is normal. No pneumothorax or pleural fluid. No acute or focal bony abnormality. IMPRESSION: Negative chest. Electronically Signed   By: Inge Rise M.D.   On: 09/25/2019 11:07   CT Angio Chest PE W and/or Wo Contrast  Result Date: 09/25/2019 CLINICAL DATA:  Shortness of breath chest pain EXAM: CT ANGIOGRAPHY CHEST WITH CONTRAST TECHNIQUE: Multidetector CT imaging of the chest was performed using the standard protocol during bolus administration of intravenous contrast. Multiplanar CT image reconstructions and MIPs were obtained to evaluate the vascular anatomy. CONTRAST:  159mL OMNIPAQUE IOHEXOL 350 MG/ML SOLN COMPARISON:  August 14, 2019 CT angiogram chest; chest radiograph September 25, 2019 FINDINGS: Cardiovascular: There is no demonstrable pulmonary embolus. There is no evident thoracic aortic aneurysm or dissection. Visualized great vessels appear normal. Right  innominate and left common carotid arteries arise as a common trunk, an anatomic variant. There are foci of aortic atherosclerosis. No pericardial effusion or pericardial thickening evident. Mediastinum/Nodes: Thyroid remains enlarged with a dominant mass arising from the right lobe of the thyroid measuring 3.0 x 2.5 cm. Thyroid otherwise appears somewhat inhomogeneous. There is no appreciable thoracic adenopathy. Small hiatal hernia noted. Lungs/Pleura: There are scattered areas of lower lobe atelectatic change bilaterally. No edema or airspace consolidation. No pleural effusions evident. Upper Abdomen: There is slight reflux of contrast into the inferior vena cava and hepatic veins. Visualized upper abdominal structures otherwise appear normal. Musculoskeletal: There is degenerative change in the thoracic spine. There are no blastic or lytic bone lesions. Review of the MIP images confirms the above findings. IMPRESSION: 1. No demonstrable pulmonary embolus. No thoracic aortic aneurysm or dissection. Foci of aortic atherosclerosis noted. 2. Enlarged right lobe of the thyroid with dominant mass measuring 3.0 x 2.5 cm. Recommend thyroid US (ref: J Am Coll Radiol. 2015 Feb;12(2): 143-50). 3. Scattered areas of atelectatic change bilaterally. No edema or airspace opacity. No pleural effusions. 4.  No evident adenopathy. 5. Slight reflux of contrast in the inferior vena cava and hepatic veins may be indicative of a degree of increased right heart pressure. Aortic Atherosclerosis (ICD10-I70.0). Electronically Signed   By: Lowella Grip III M.D.   On: 09/25/2019 13:18    Procedures Procedures (including critical care time)  Medications Ordered in ED Medications  sodium chloride flush (NS) 0.9 % injection 3 mL ( Intravenous Not Given 09/25/19 1254)  iohexol (OMNIPAQUE) 350 MG/ML injection 100 mL (100 mLs Intravenous Contrast Given 09/25/19 1232)    ED Course  I have reviewed the triage vital signs and the  nursing notes.  Pertinent labs & imaging results that were available during my care of the patient were reviewed by me and considered in my medical decision making (see chart for details).  59 yo female here with constellation of symptoms, sounding largely viral, including sore throat, fevers, cough, chest pain, and back pain since yesterday  Has not received covid vaccines - will test here [name] was evaluated in Emergency Department on 09/25/2019 for the symptoms described in the history of present illness. She was evaluated in the context of the global COVID-19 pandemic, which necessitated consideration that the patient might be at risk for infection with the SARS-CoV-2 virus that causes COVID-19. Institutional protocols and algorithms that pertain to the evaluation of patients at risk for COVID-19 are in a state of rapid change based on  information released by regulatory bodies including the CDC and federal and state organizations. These policies and algorithms were followed during the patient's care in the ED.  DDx also includes PE vs PNA vs cervical angina  Hx of PE, chronically elevated DDimer, but she is off A/C.  Low threshold for CT PE study  Xrays pending as initial screening  Doubtful of ACS without ischemic ECG, unremarkable troponin, and robust outpatient cardiac w/u, including echo and stress in past 6 months, and clean coronary LHC in 2016.  Cervical angina possible as chest pain is reproducible with back movement.   I personally ordered and reviewed the blood work and imaging, including xray and CT scan, as well as her ECG. Medical chart reviewed for cardiac w/u as noted above  Clinical Course as of Sep 25 1750  Thu Sep 25, 2019  1141 Repeat ECG with improved baseline, no STEMI per my interpretation, NSR   [MT]  1142 Xray personally reviewed by me, clear lung fields, no focal consolidations   [MT]  1142 Trop 7   [MT]  1255 SARS Coronavirus 2: NEGATIVE [MT]    1330 IMPRESSION: 1. No demonstrable pulmonary embolus. No thoracic aortic aneurysm or dissection. Foci of aortic atherosclerosis noted.  2. Enlarged right lobe of the thyroid with dominant mass measuring 3.0 x 2.5 cm. Recommend thyroid US (ref: J Am Coll Radiol. 2015 Feb;12(2): 143-50).  3. Scattered areas of atelectatic change bilaterally. No edema or airspace opacity. No pleural effusions.  4. No evident adenopathy.  5. Slight reflux of contrast in the inferior vena cava and hepatic veins may be indicative of a degree of increased right heart pressure.    [MT]  1330 With negative CT PE and trop of 7, doubtful of both PE or ACS.  This may be a viral syndrome especially with her sore throat and fatigue.  Highly doubtful for sepsis or bacterial infection.  Covid is negative.  On reassessment she feels well.  We discussed discharge with home rest, plenty of fluids, and PCP f/u   [MT]    Clinical Course User Index [MT] Rontavious Albright, Carola Rhine, MD    Final Clinical Impression(s) / ED Diagnoses Final diagnoses:  Viral syndrome  Sore throat    Rx / DC Orders ED Discharge Orders    None       Wyvonnia Dusky, MD 09/25/19 1752

## 2019-09-25 NOTE — ED Notes (Signed)
Pt verbalizes understanding of DC instructions. Pt belongings returned and is ambulatory out of ED.  

## 2019-09-27 ENCOUNTER — Emergency Department (HOSPITAL_COMMUNITY): Payer: Medicare Other

## 2019-09-27 ENCOUNTER — Encounter (HOSPITAL_COMMUNITY): Payer: Self-pay | Admitting: Emergency Medicine

## 2019-09-27 ENCOUNTER — Other Ambulatory Visit: Payer: Self-pay

## 2019-09-27 ENCOUNTER — Emergency Department (HOSPITAL_COMMUNITY)
Admission: EM | Admit: 2019-09-27 | Discharge: 2019-09-28 | Disposition: A | Discharge status: Home / Self Care | Payer: Medicare Other | Attending: Emergency Medicine | Admitting: Emergency Medicine

## 2019-09-27 DIAGNOSIS — Z86711 Personal history of pulmonary embolism: Secondary | ICD-10-CM | POA: Insufficient documentation

## 2019-09-27 DIAGNOSIS — R519 Headache, unspecified: Secondary | ICD-10-CM | POA: Insufficient documentation

## 2019-09-27 DIAGNOSIS — I5030 Unspecified diastolic (congestive) heart failure: Secondary | ICD-10-CM | POA: Diagnosis not present

## 2019-09-27 DIAGNOSIS — I110 Hypertensive heart disease with heart failure: Secondary | ICD-10-CM | POA: Insufficient documentation

## 2019-09-27 DIAGNOSIS — Z7982 Long term (current) use of aspirin: Secondary | ICD-10-CM | POA: Insufficient documentation

## 2019-09-27 DIAGNOSIS — Z79899 Other long term (current) drug therapy: Secondary | ICD-10-CM | POA: Diagnosis not present

## 2019-09-27 DIAGNOSIS — Z8673 Personal history of transient ischemic attack (TIA), and cerebral infarction without residual deficits: Secondary | ICD-10-CM | POA: Insufficient documentation

## 2019-09-27 DIAGNOSIS — Z96652 Presence of left artificial knee joint: Secondary | ICD-10-CM | POA: Diagnosis not present

## 2019-09-27 DIAGNOSIS — Z7984 Long term (current) use of oral hypoglycemic drugs: Secondary | ICD-10-CM | POA: Diagnosis not present

## 2019-09-27 DIAGNOSIS — J029 Acute pharyngitis, unspecified: Secondary | ICD-10-CM | POA: Diagnosis not present

## 2019-09-27 DIAGNOSIS — Z96642 Presence of left artificial hip joint: Secondary | ICD-10-CM | POA: Diagnosis not present

## 2019-09-27 DIAGNOSIS — Z85830 Personal history of malignant neoplasm of bone: Secondary | ICD-10-CM | POA: Insufficient documentation

## 2019-09-27 DIAGNOSIS — J45909 Unspecified asthma, uncomplicated: Secondary | ICD-10-CM | POA: Diagnosis not present

## 2019-09-27 DIAGNOSIS — R5383 Other fatigue: Secondary | ICD-10-CM | POA: Diagnosis present

## 2019-09-27 DIAGNOSIS — E119 Type 2 diabetes mellitus without complications: Secondary | ICD-10-CM | POA: Insufficient documentation

## 2019-09-27 DIAGNOSIS — J069 Acute upper respiratory infection, unspecified: Secondary | ICD-10-CM | POA: Insufficient documentation

## 2019-09-27 DIAGNOSIS — Z87891 Personal history of nicotine dependence: Secondary | ICD-10-CM | POA: Insufficient documentation

## 2019-09-27 LAB — CBC WITH DIFFERENTIAL/PLATELET
Abs Immature Granulocytes: 0.02 10*3/uL (ref 0.00–0.07)
Basophils Absolute: 0.1 10*3/uL (ref 0.0–0.1)
Basophils Relative: 1 %
Eosinophils Absolute: 0.3 10*3/uL (ref 0.0–0.5)
Eosinophils Relative: 4 %
HCT: 37.7 % (ref 36.0–46.0)
Hemoglobin: 12 g/dL (ref 12.0–15.0)
Immature Granulocytes: 0 %
Lymphocytes Relative: 29 %
Lymphs Abs: 1.9 10*3/uL (ref 0.7–4.0)
MCH: 28.2 pg (ref 26.0–34.0)
MCHC: 31.8 g/dL (ref 30.0–36.0)
MCV: 88.7 fL (ref 80.0–100.0)
Monocytes Absolute: 0.8 10*3/uL (ref 0.1–1.0)
Monocytes Relative: 13 %
Neutro Abs: 3.4 10*3/uL (ref 1.7–7.7)
Neutrophils Relative %: 53 %
Platelets: 217 10*3/uL (ref 150–400)
RBC: 4.25 MIL/uL (ref 3.87–5.11)
RDW: 13.6 % (ref 11.5–15.5)
WBC: 6.4 10*3/uL (ref 4.0–10.5)
nRBC: 0 % (ref 0.0–0.2)

## 2019-09-27 LAB — BASIC METABOLIC PANEL
Anion gap: 8 (ref 5–15)
BUN: 10 mg/dL (ref 6–20)
CO2: 29 mmol/L (ref 22–32)
Calcium: 9 mg/dL (ref 8.9–10.3)
Chloride: 104 mmol/L (ref 98–111)
Creatinine, Ser: 0.96 mg/dL (ref 0.44–1.00)
GFR calc Af Amer: >60 mL/min (ref >60)
GFR calc non Af Amer: >60 mL/min (ref >60)
Glucose, Bld: 121 mg/dL — ABNORMAL HIGH (ref 70–99)
Potassium: 3.7 mmol/L (ref 3.5–5.1)
Sodium: 141 mmol/L (ref 135–145)

## 2019-09-27 LAB — TROPONIN I (HIGH SENSITIVITY): Troponin I (High Sensitivity): 9 ng/L (ref <18)

## 2019-09-27 LAB — GROUP A STREP BY PCR: Group A Strep by PCR: NOT DETECTED

## 2019-09-27 MED ORDER — DIPHENHYDRAMINE HCL 50 MG/ML IJ SOLN
12.5000 mg | Freq: Once | INTRAMUSCULAR | Status: AC
  Administered 2019-09-27: 12.5 mg via INTRAVENOUS
  Filled 2019-09-27: qty 1

## 2019-09-27 MED ORDER — SODIUM CHLORIDE 0.9% FLUSH: 3.0000 mL | Freq: Once | INTRAVENOUS | Status: DC

## 2019-09-27 MED ORDER — METOCLOPRAMIDE HCL 5 MG/ML IJ SOLN
10.0000 mg | Freq: Once | INTRAMUSCULAR | Status: AC
  Administered 2019-09-27: 10 mg via INTRAVENOUS
  Filled 2019-09-27: qty 2

## 2019-09-27 MED ORDER — SODIUM CHLORIDE 0.9 % IV BOLUS
1000.0000 mL | Freq: Once | INTRAVENOUS | Status: AC
  Administered 2019-09-27: 1000 mL via INTRAVENOUS

## 2019-09-27 MED ORDER — ALBUTEROL SULFATE HFA 108 (90 BASE) MCG/ACT IN AERS
2.0000 | INHALATION_SPRAY | Freq: Once | RESPIRATORY_TRACT | Status: AC
  Administered 2019-09-27: 2 via RESPIRATORY_TRACT
  Filled 2019-09-27: qty 6.7

## 2019-09-27 NOTE — ED Provider Notes (Signed)
[facility] DEPT Provider Note   CSN: [id number] Arrival date & time: 09/27/19  2116     History Chief Complaint  Patient presents with   Headache   Fatigue    [name] is a 59 y.o. female with a past medical history of diabetes, hypertension, prior PE not currently on anticoagulation presenting to the ED with a chief complaint of continued cough, chest pain, sore throat, body aches, chills, rhinorrhea and headache.  States that her symptoms began approximately 2 days ago.  She was seen and evaluated when symptoms first began with reassuring work-up including negative CTA to rule out a PE.  She has not been taking any medications to help with her symptoms.  She reports diarrhea, nausea but denies any vomiting.  Reports sick contacts with similar symptoms she believes at work.  She had a Covid test done 2 days ago which was negative.  Chest pain is worse with movement, coughing.  She denies any neck stiffness, abdominal pain, shortness of breath, recent travel or leg swelling.  HPI     Past Medical History:  Diagnosis Date   Anginal pain ([place])    a. NL cath in 2008;  b. Myoview 03/2011: dec uptake along mid anterior wall on stress imaging -> ? attenuation vs. ischemia, EF 65%;  c. Echo 04/2011: EF 55-60%, no RWMA, Gr 2 dd   Anxiety    Arthritis    Asthma    Back pain    Bone cancer (HCC)    Cancer (HCC)    Chest pain    CHF (congestive heart failure) (HCC)    CHF (congestive heart failure) ([place])    Depression    Diabetes mellitus    Drug use    Dyspnea    with exertion   Dyspnea    Frequent urination    GERD (gastroesophageal reflux disease)    Glaucoma    HLD (hyperlipidemia)    Hypertension    IBS (irritable bowel syndrome)    Joint pain    Lactose intolerance    Leg edema    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 benign thymoma   Obesity    Palpitations    Pneumonia 05/2016   double    Pulmonary edema    Pulmonary embolism ([place])    a. 2008 -> coumadin x 6 mos.   Rheumatoid arthritis ([place])    Sleep apnea    on CPAP 02/2018   TIA (transient ischemic attack)    Urinary urgency     Patient Active Problem List   Diagnosis Date Noted   Unilateral primary osteoarthritis, left hip    Hip arthritis 06/11/2018   Class 3 severe obesity with serious comorbidity and body mass index (BMI) of 40.0 to 44.9 in adult [facility]) 05/29/2018   Other fatigue 11/20/2017   Shortness of breath on exertion 11/20/2017   Type 2 diabetes mellitus without complication, without long-term current use of insulin ([place]) 11/20/2017   Vitamin D deficiency 11/20/2017   Depression 11/20/2017   Other hyperlipidemia 11/20/2017   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/04/2016   Presence of left artificial knee joint 09/20/2016   Arthritis of knee 08/22/2016   Cervical radiculopathy 07/26/2016   Cervical disc disorder with radiculopathy 07/26/2016   Right arm pain 06/29/2016   Cervicalgia 06/29/2016   Primary osteoarthritis of left knee 06/29/2016   Hypersomnia with sleep apnea 11/18/2015   Lethargy 11/18/2015   Chest pain 09/30/2014  Abnormal cardiac function test 09/29/2014   Chest pain with moderate risk for cardiac etiology 09/28/2014   HTN (hypertension) 09/28/2014   Hypokalemia    Paresthesia 06/18/2014   Numbness and tingling of left arm and leg    Unstable angina ([place]) 05/24/2014   OSA on CPAP 05/24/2014   Diabetes mellitus type 2 in obese ([place]) 10/29/2012   S/P laparoscopic appendectomy 70/26/3785   Diastolic CHF ([place]) 88/50/2774   Mediastinal mass 01/09/2012   SOB (shortness of breath) 06/20/2011   Mediastinal abnormality 06/20/2011   Dyslipidemia 12/23/2009   GLAUCOMA 12/23/2009   ARTHRITIS 12/23/2009   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   Class 2 severe obesity with serious comorbidity and body mass index (BMI) of 38.0 to 38.9 in adult ([place]) 09/13/2006    ANXIETY STATE NOS 09/13/2006   DISORDER, DEPRESSIVE NEC 09/13/2006   CARPAL TUNNEL SYNDROME, MILD 09/13/2006   Unspecified essential hypertension 09/13/2006   IBS 09/13/2006   DEGENERATION, LUMBAR/LUMBOSACRAL DISC 09/13/2006   SYMPTOM, SWELLING/MASS/LUMP IN CHEST 09/13/2006   PULMONARY EMBOLISM, HX OF 09/13/2006    Past Surgical History:  Procedure Laterality Date   ABDOMINAL HYSTERECTOMY  2005   APPENDECTOMY     CARDIAC CATHETERIZATION     Normal   CARDIAC CATHETERIZATION N/A 09/30/2014   Procedure: Left Heart Cath and Coronary Angiography;  Surgeon: Sherren Mocha, MD;  Location: [facility];  Service: Cardiovascular;  Laterality: N/A;   LAPAROSCOPIC APPENDECTOMY N/A 06/03/2012   Procedure: APPENDECTOMY LAPAROSCOPIC;  Surgeon: Stark Klein, MD;  Location: [facility];  Service: General;  Laterality: N/A;   Left knee surgery  2008   LEG SURGERY     TONSILLECTOMY     TOTAL HIP ARTHROPLASTY Left 06/11/2018   Procedure: LEFT TOTAL HIP ARTHROPLASTY ANTERIOR APPROACH;  Surgeon: Meredith Pel, MD;  Location: [facility];  Service: Orthopedics;  Laterality: Left;   TOTAL KNEE ARTHROPLASTY Left 08/22/2016   Procedure: TOTAL KNEE ARTHROPLASTY;  Surgeon: Meredith Pel, MD;  Location: [facility];  Service: Orthopedics;  Laterality: Left;   TUBAL LIGATION  1989     OB History    Gravida  3   Para      Term      Preterm      AB      Living        SAB      TAB      Ectopic      Multiple      Live Births              Family History  Problem Relation Age of Onset   Emphysema Mother    Arthritis Mother    Heart failure Mother        alive @ 82   Stroke Mother    Diabetes Mother    Hypertension Mother    Hyperlipidemia Mother    Depression Mother    Anxiety disorder Mother    Asthma Brother    Heart disease Father        died @ 62's.   Stroke Father    Diabetes Father    Hyperlipidemia Father    Hypertension Father    Bipolar  disorder Father    Sleep apnea Father    Alcoholism Father    Drug abuse Father    Diabetes Sister    Heart disease Paternal Grandfather    Colon cancer Maternal Grandfather    Breast cancer Maternal Aunt     Social History   Tobacco  Use   Smoking status: Former Smoker    Packs/day: 0.50    Years: 15.00    Pack years: 7.50    Types: Cigarettes    Quit date: 04/24/1985    Years since quitting: 34.4   Smokeless tobacco: Never Used  Substance Use Topics   Alcohol use: No    Alcohol/week: 0.0 standard drinks   Drug use: Not Currently    Types: Marijuana    Home Medications Prior to Admission medications   Medication Sig Start Date End Date Taking? Authorizing Provider  albuterol (PROVENTIL HFA;VENTOLIN HFA) 108 (90 Base) MCG/ACT inhaler Inhale 2 puffs into the lungs every 6 (six) hours as needed for wheezing or shortness of breath.   Yes [provider]  aspirin 81 MG chewable tablet Chew 81 mg by mouth daily.   Yes [provider]  atorvastatin (LIPITOR) 40 MG tablet Take 40 mg by mouth at bedtime.    Yes [provider]  busPIRone (BUSPAR) 30 MG tablet Take 30 mg by mouth 2 (two) times daily. 09/21/19  Yes [provider]  carvedilol (COREG) 12.5 MG tablet Take 12.5 mg by mouth at bedtime. 08/11/19  Yes [provider]  diclofenac Sodium (VOLTAREN) 1 % GEL Apply 2 g topically 4 (four) times daily. 09/20/19  Yes [provider]  escitalopram (LEXAPRO) 20 MG tablet Take 20 mg by mouth daily. 02/21/19  Yes [provider]  furosemide (LASIX) 40 MG tablet Take 40 mg by mouth daily.   Yes Rai, Ripudeep K, MD  isosorbide dinitrate (ISORDIL) 20 MG tablet Take 20 mg by mouth 2 (two) times daily.    Yes [provider]  lisinopril (PRINIVIL,ZESTRIL) 20 MG tablet Take 20 mg by mouth daily.  09/21/14  Yes [provider]  meloxicam (MOBIC) 15 MG tablet 1 po q d prn Patient taking differently: Take 15 mg  by mouth daily as needed for pain.  09/05/19  Yes Meredith Pel, MD  MYRBETRIQ 25 MG TB24 tablet Take 1 tablet (25 mg total) by mouth daily. 03/13/19  Yes Beasley, Caren D, MD  omeprazole (PRILOSEC) 20 MG capsule Take 20 mg by mouth 2 (two) times daily. 10/05/16  Yes [provider]  potassium chloride (KLOR-CON M10) 10 MEQ tablet Take 1 tablet (10 mEq total) by mouth 2 (two) times daily. 05/27/18  Yes Jearld Lesch A, DO  SitaGLIPtin-MetFORMIN HCl (JANUMET XR) 50-500 MG TB24 Take 1 tablet by mouth daily. 08/27/19  Yes Whitmire, Joneen Boers, FNP  traZODone (DESYREL) 50 MG tablet Take 1 tablet by mouth at bedtime. 09/09/19  Yes [provider]  benzonatate (TESSALON) 100 MG capsule Take 1 capsule (100 mg total) by mouth every 8 (eight) hours. 09/28/19   Shalina Norfolk, PA-C  lidocaine (XYLOCAINE) 2 % solution Use as directed 15 mLs in the mouth or throat as needed for mouth pain. 09/28/19   Vanice Rappa, PA-C  loperamide (IMODIUM A-D) 2 MG tablet Take 4 mg by mouth 4 (four) times daily as needed for diarrhea or loose stools.     [provider]  Vitamin D, Ergocalciferol, (DRISDOL) 1.25 MG (50000 UNIT) CAPS capsule Take 1 capsule (50,000 Units total) by mouth every 7 (seven) days. 09/17/19   Whitmire, Dawn W, FNP  KOMBIGLYZE XR 5-500 MG TB24 Take 1 tablet by mouth daily.  10/11/15 11/26/18  [provider]    Allergies    Penicillins, Hydrocodone-acetaminophen, Latex, and Sulfa antibiotics  Review of Systems   Review of  Systems  Constitutional: Positive for appetite change and chills. Negative for fever.  HENT: Positive for congestion, rhinorrhea and sore throat. Negative for ear pain and sneezing.   Eyes: Negative for photophobia and visual disturbance.  Respiratory: Positive for cough. Negative for chest tightness, shortness of breath and wheezing.   Cardiovascular: Positive for chest pain. Negative for palpitations.  Gastrointestinal: Positive for nausea. Negative for  abdominal pain, blood in stool, constipation, diarrhea and vomiting.  Genitourinary: Negative for dysuria, hematuria and urgency.  Musculoskeletal: Positive for myalgias.  Skin: Negative for rash.  Neurological: Positive for headaches. Negative for dizziness, weakness and light-headedness.    Physical Exam Updated Vital Signs BP (!) 108/47    Pulse 73    Temp 98.4 F (36.9 C)    Resp (!) 21    SpO2 95%   Physical Exam Vitals and nursing note reviewed.  Constitutional:      General: She is not in acute distress.    Appearance: She is well-developed.  HENT:     Head: Normocephalic and atraumatic.     Nose: Rhinorrhea present.     Mouth/Throat:     Pharynx: Posterior oropharyngeal erythema present.     Comments: Patient does not appear to be in acute distress. No trismus or drooling present. No pooling of secretions. Patient is tolerating secretions and is not in respiratory distress. No neck pain or tenderness to palpation of the neck. Full active and passive range of motion of the neck. No evidence of RPA or PTA. Eyes:     General: No scleral icterus.       Right eye: No discharge.        Left eye: No discharge.     Conjunctiva/sclera: Conjunctivae normal.  Cardiovascular:     Rate and Rhythm: Normal rate and regular rhythm.     Heart sounds: Normal heart sounds. No murmur. No friction rub. No gallop.   Pulmonary:     Effort: Pulmonary effort is normal. No respiratory distress.     Breath sounds: Normal breath sounds.  Abdominal:     General: Bowel sounds are normal. There is no distension.     Palpations: Abdomen is soft.     Tenderness: There is no abdominal tenderness. There is no guarding.  Musculoskeletal:        General: Normal range of motion.     Cervical back: Normal range of motion and neck supple.  Skin:    General: Skin is warm and dry.     Findings: No rash.  Neurological:     Mental Status: She is alert.     Motor: No abnormal muscle tone.     Coordination:  Coordination normal.     ED Results / Procedures / Treatments   Labs (all labs ordered are listed, but only abnormal results are displayed) Labs Reviewed  BASIC METABOLIC PANEL - Abnormal; Notable for the following components:      Result Value   Glucose, Bld 121 (*)    All other components within normal limits  GROUP A STREP BY PCR  CBC WITH DIFFERENTIAL/PLATELET  TROPONIN I (HIGH SENSITIVITY)    EKG EKG Interpretation  Date/Time:  Saturday September 27 2019 23:03:14 EDT Ventricular Rate:  74 PR Interval:    QRS Duration: 105 QT Interval:  364 QTC Calculation: 404 R Axis:   88 Text Interpretation: Sinus rhythm Low voltage, precordial leads Anteroseptal infarct, old Borderline repolarization abnormality No significant change since last tracing Confirmed by Ripley Fraise [phone])  on 09/27/2019 11:11:09 PM   Radiology DG Chest 2 View  Result Date: 09/27/2019 CLINICAL DATA:  Cough and fever. EXAM: CHEST - 2 VIEW COMPARISON:  Radiograph and chest CTA 2 days ago 09/25/2019 FINDINGS: The cardiomediastinal contours are unchanged. Pulmonary vasculature is normal. No consolidation, pleural effusion, or pneumothorax. No acute osseous abnormalities are seen. IMPRESSION: No acute chest findings. No change from radiograph and chest CT 2 days ago. Electronically Signed   By: Keith Rake M.D.   On: 09/27/2019 22:40    Procedures Procedures (including critical care time)  Medications Ordered in ED Medications  sodium chloride flush (NS) 0.9 % injection 3 mL (has no administration in time range)  albuterol (VENTOLIN HFA) 108 (90 Base) MCG/ACT inhaler 2 puff (2 puffs Inhalation Given 09/27/19 2318)  sodium chloride 0.9 % bolus 1,000 mL (1,000 mLs Intravenous New Bag/Given 09/27/19 2348)  metoCLOPramide (REGLAN) injection 10 mg (10 mg Intravenous Given 09/27/19 2347)  diphenhydrAMINE (BENADRYL) injection 12.5 mg (12.5 mg Intravenous Given 09/27/19 2347)  ketorolac (TORADOL) 30 MG/ML injection 30 mg  (30 mg Intravenous Given 09/28/19 0110)    ED Course  I have reviewed the triage vital signs and the nursing notes.  Pertinent labs & imaging results that were available during my care of the patient were reviewed by me and considered in my medical decision making (see chart for details).    MDM Rules/Calculators/A&P                      59 year old female with past medical history of diabetes, hypertension, prior PE not currently on anticoagulation presenting to the ED with a chief complaint of continued cough, chest pain, sore throat, body aches, chills, rhinorrhea and headache.  Symptoms began 2 days ago when she was initially seen here.  She had a reassuring work-up including negative CTA to rule out PE.  She has not been taking medications to help with her symptoms.  On exam patient with rhinorrhea, posterior oropharyngeal erythema.  Lungs are clear on my exam but she reports some wheezing.  No meningismus noted.  Abdomen is soft, nontender nondistended.  EKG here shows sinus rhythm, no changes from prior tracing.  Chest x-ray without any acute findings.  BMP, CBC and troponin unremarkable.  Strep test negative.  Suspect that her symptoms are viral in nature.  I have low suspicion for ACS as her EKG is nonischemic, no evidence of PE on CT scan 2 days ago.  Her Covid test was -2 days ago as well.  We will have her continue symptomatic treatment and increase hydration.  She will have her follow-up with PCP and return for worsening symptoms.  All imaging, if done today, including plain films, CT scans, and ultrasounds, independently reviewed by me, and interpretations confirmed via formal radiology reads.  Patient is hemodynamically stable, in NAD, and able to ambulate in the ED. Evaluation does not show pathology that would require ongoing emergent intervention or inpatient treatment. I explained the diagnosis to the patient. Pain has been managed and has no complaints prior to discharge. Patient is  comfortable with above plan and is stable for discharge at this time. All questions were answered prior to disposition. Strict return precautions for returning to the ED were discussed. Encouraged follow up with PCP.   An After Visit Summary was printed and given to the patient.   Portions of this note were generated with [employer]. Dictation errors may occur despite best attempts at proofreading.  Final Clinical Impression(s) / ED Diagnoses Final diagnoses:  Viral pharyngitis  Viral URI with cough    Rx / DC Orders ED Discharge Orders         Ordered    benzonatate (TESSALON) 100 MG capsule  Every 8 hours     09/28/19 0206    lidocaine (XYLOCAINE) 2 % solution  As needed     09/28/19 0206           Delia Heady, PA-C 09/28/19 0209    Ripley Fraise, MD 09/28/19 [phone]

## 2019-09-27 NOTE — ED Triage Notes (Addendum)
Patient states that yesterday she began having generalized malaise and headache. Patient seen recently, and her cough is getting worse. Patient states all over body aches, runny nose and congestion. Patient had 101 temp at home. Patient also endorses nausea and diarrhea.

## 2019-09-28 DIAGNOSIS — J029 Acute pharyngitis, unspecified: Secondary | ICD-10-CM | POA: Diagnosis not present

## 2019-09-28 MED ORDER — BENZONATATE 100 MG PO CAPS
100.0000 mg | ORAL_CAPSULE | Freq: Three times a day (TID) | ORAL | 0 refills | Status: DC
Start: 2019-09-28 — End: 2019-11-18

## 2019-09-28 MED ORDER — KETOROLAC TROMETHAMINE 30 MG/ML IJ SOLN
30.0000 mg | Freq: Once | INTRAMUSCULAR | Status: AC
  Administered 2019-09-28: 30 mg via INTRAVENOUS
  Filled 2019-09-28: qty 1

## 2019-09-28 MED ORDER — LIDOCAINE VISCOUS HCL 2 % MT SOLN
15.0000 mL | OROMUCOSAL | 0 refills | Status: DC | PRN
Start: 2019-09-28 — End: 2019-11-18

## 2019-09-28 NOTE — Discharge Instructions (Signed)
Take the Tessalon Perles to help with your cough. Swish and spit the lidocaine solution to help with throat discomfort. Follow-up with your primary care provider. You can take Tylenol as needed to help with your body aches and fever. Return to the ER for worsening chest pain, neck stiffness, uncontrollable vomiting.

## 2019-09-28 NOTE — ED Provider Notes (Signed)
Patient seen/examined in the Emergency Department in conjunction with Advanced Practice Provider  Patient reports cough, congestion, headache, diarrhea and sore throat.  Seen recently in the ED for similar symptoms Exam : awake/alert, no distress.  Pt sounds congested.  No crackles noted on lung exam Plan: labs/imaging unremarkable.  Likely viral illness.  D/c home     Ripley Fraise, MD 09/28/19 Shelah Lewandowsky

## 2019-10-07 ENCOUNTER — Other Ambulatory Visit: Payer: Self-pay

## 2019-10-07 ENCOUNTER — Ambulatory Visit (INDEPENDENT_AMBULATORY_CARE_PROVIDER_SITE_OTHER): Payer: Medicare Other | Admitting: Family Medicine

## 2019-10-07 ENCOUNTER — Encounter (INDEPENDENT_AMBULATORY_CARE_PROVIDER_SITE_OTHER): Payer: Self-pay | Admitting: Family Medicine

## 2019-10-07 VITALS — BP 123/78 | HR 62 | Temp 98.0°F | Ht 59.0 in | Wt 216.0 lb

## 2019-10-07 DIAGNOSIS — E559 Vitamin D deficiency, unspecified: Secondary | ICD-10-CM | POA: Diagnosis not present

## 2019-10-07 DIAGNOSIS — Z6841 Body Mass Index (BMI) 40.0 and over, adult: Secondary | ICD-10-CM

## 2019-10-07 DIAGNOSIS — E1169 Type 2 diabetes mellitus with other specified complication: Secondary | ICD-10-CM

## 2019-10-07 MED ORDER — VITAMIN D (ERGOCALCIFEROL) 1.25 MG (50000 UNIT) PO CAPS: 50000.0000 [IU] | ORAL_CAPSULE | ORAL | 0 refills | Status: DC

## 2019-10-07 NOTE — Progress Notes (Signed)
Chief Complaint:   OBESITY [name] is here to discuss her progress with her obesity treatment plan along with follow-up of her obesity related diagnoses. [name] is on the Category 2 Plan and states she is following her eating plan approximately 80% of the time. [name] states she is walking for 30 minutes 5 times per week.  Today's visit was #: 75 Starting weight: 225 lbs Starting date: 11/20/2017 Today's weight: 216 lbs Today's date: 10/07/2019 Total lbs lost to date: 9 Total lbs lost since last in-office visit: 4  Interim History: [name] is doing better with adhering to the meal plan. She is not skipping meals as much. She is in a job training program currently, which has enabled her to increase her steps and walking.  Subjective:   1. Vitamin D deficiency [name] is currently on prescription Vit D 50,000 IU weekly. Vit D is low at 36.9.  2. Type 2 diabetes mellitus without complication, without long-term current use of insulin ([place]) [name]'s diabetes mellitus is well controlled. Last A1c was 7 at her primary care physician office in January 2021. She is on   Lab Results  Component Value Date   HGBA1C 6.3 (H) 12/26/2018   HGBA1C 6.6 (H) 05/22/2018   HGBA1C 6.8 (H) 03/04/2018   Lab Results  Component Value Date   MICROALBUR 0.51 03/01/2010   LDLCALC 41 12/26/2018   CREATININE 0.96 09/27/2019   Lab Results  Component Value Date   INSULIN 28.1 (H) 12/26/2018   INSULIN 20.2 03/04/2018   INSULIN 53.9 (H) 11/20/2017   Assessment/Plan:   1. Vitamin D deficiency Low Vitamin D level contributes to fatigue and are associated with obesity, breast, and colon cancer. We will check labs today, and we will refill prescription Vitamin D for 1 month. [name] will follow-up for routine testing of Vitamin D, at least 2-3 times per year to avoid over-replacement.  - VITAMIN D 25 Hydroxy (Vit-D Deficiency, Fractures)  - Vitamin D, Ergocalciferol, (DRISDOL) 1.25 MG (50000 UNIT) CAPS  capsule; Take 1 capsule (50,000 Units total) by mouth every 7 (seven) days.  Dispense: 4 capsule; Refill: 0  2. Type 2 diabetes mellitus without complication, without long-term current use of insulin (HCC)  [place] will continue metformin, and we will check labs today.  - Hemoglobin A1c  3. Class 3 severe obesity with serious comorbidity and body mass index (BMI) of 40.0 to 44.9 in adult, unspecified obesity type (HCC) [name] is currently in the action stage of change. As such, her goal is to continue with weight loss efforts. She has agreed to the Category 2 Plan.   Exercise goals: As is.  Behavioral modification strategies: increasing lean protein intake and decreasing simple carbohydrates.  [name] has agreed to follow-up with our clinic in 3 weeks. She was informed of the importance of frequent follow-up visits to maximize her success with intensive lifestyle modifications for her multiple health conditions.   [name] was informed we would discuss her lab results at her next visit unless there is a critical issue that needs to be addressed sooner. [name] agreed to keep her next visit at the agreed upon time to discuss these results.  Objective:   Blood pressure 123/78, pulse 62, temperature 98 F (36.7 C), temperature source Oral, height 4\' 11"  (1.499 m), weight 216 lb (98 kg), SpO2 97 %. Body mass index is 43.63 kg/m.  General: Cooperative, alert, well developed, in no acute distress. HEENT: Conjunctivae and lids unremarkable. Cardiovascular: Regular rhythm.  Lungs: Normal  work of breathing. Neurologic: No focal deficits.   Lab Results  Component Value Date   CREATININE 0.96 09/27/2019   BUN 10 09/27/2019   NA 141 09/27/2019   K 3.7 09/27/2019   CL 104 09/27/2019   CO2 29 09/27/2019   Lab Results  Component Value Date   ALT 33 08/14/2019   AST 17 08/14/2019   ALKPHOS 68 08/14/2019   BILITOT 0.5 08/14/2019   Lab Results  Component Value Date   HGBA1C 6.3 (H)  12/26/2018   HGBA1C 6.6 (H) 05/22/2018   HGBA1C 6.8 (H) 03/04/2018   HGBA1C 8.8 (H) 11/20/2017   HGBA1C 7.4 (H) 08/11/2016   Lab Results  Component Value Date   INSULIN 28.1 (H) 12/26/2018   INSULIN 20.2 03/04/2018   INSULIN 53.9 (H) 11/20/2017   Lab Results  Component Value Date   TSH 0.458 03/05/2019   Lab Results  Component Value Date   CHOL 119 12/26/2018   HDL 58 12/26/2018   LDLCALC 41 12/26/2018   TRIG 108 12/26/2018   CHOLHDL 4.2 04/23/2011   CHOLHDL 4.3 04/23/2011   Lab Results  Component Value Date   WBC 6.4 09/27/2019   HGB 12.0 09/27/2019   HCT 37.7 09/27/2019   MCV 88.7 09/27/2019   PLT 217 09/27/2019   No results found for: IRON, TIBC, FERRITIN  Obesity Behavioral Intervention Documentation for Insurance:   Approximately 15 minutes were spent on the discussion below.  ASK: We discussed the diagnosis of obesity with [name] today and [name] agreed to give [name] permission to discuss obesity behavioral modification therapy today.  ASSESS: [name] has the diagnosis of obesity and her BMI today is 43.6. [name] is in the action stage of change.   ADVISE: [name] was educated on the multiple health risks of obesity as well as the benefit of weight loss to improve her health. She was advised of the need for long term treatment and the importance of lifestyle modifications to improve her current health and to decrease her risk of future health problems.  AGREE: Multiple dietary modification options and treatment options were discussed and [name] agreed to follow the recommendations documented in the above note.  ARRANGE: [name] was educated on the importance of frequent visits to treat obesity as outlined per CMS and USPSTF guidelines and agreed to schedule her next follow up appointment today.  Attestation Statements:   Reviewed by clinician on day of visit: allergies, medications, problem list, medical history, surgical history, family history, social  history, and previous encounter notes.   Wilhemena Durie, am acting as Location manager for [employer], FNP-C.  I have reviewed the above documentation for accuracy and completeness, and I agree with the above. -  Georgianne Fick, FNP

## 2019-10-08 ENCOUNTER — Other Ambulatory Visit: Payer: Medicare Other

## 2019-10-08 ENCOUNTER — Encounter (INDEPENDENT_AMBULATORY_CARE_PROVIDER_SITE_OTHER): Payer: Self-pay | Admitting: Family Medicine

## 2019-10-08 ENCOUNTER — Ambulatory Visit
Admission: RE | Admit: 2019-10-08 | Discharge: 2019-10-08 | Disposition: A | Discharge status: Home / Self Care | Payer: Medicare Other | Source: Ambulatory Visit | Attending: Orthopedic Surgery | Admitting: Orthopedic Surgery

## 2019-10-08 DIAGNOSIS — M4802 Spinal stenosis, cervical region: Secondary | ICD-10-CM

## 2019-10-08 LAB — VITAMIN D 25 HYDROXY (VIT D DEFICIENCY, FRACTURES): Vit D, 25-Hydroxy: 31.8 ng/mL (ref 30.0–100.0)

## 2019-10-08 LAB — HEMOGLOBIN A1C
Est. average glucose Bld gHb Est-mCnc: 169 mg/dL
Hgb A1c MFr Bld: 7.5 % — ABNORMAL HIGH (ref 4.8–5.6)

## 2019-10-13 ENCOUNTER — Ambulatory Visit (INDEPENDENT_AMBULATORY_CARE_PROVIDER_SITE_OTHER): Payer: Medicare Other | Admitting: Orthopedic Surgery

## 2019-10-13 DIAGNOSIS — M48061 Spinal stenosis, lumbar region without neurogenic claudication: Secondary | ICD-10-CM

## 2019-10-15 ENCOUNTER — Encounter: Payer: Self-pay | Admitting: Orthopedic Surgery

## 2019-10-15 NOTE — Progress Notes (Signed)
Office Visit Note   Patient: [name]           Date of Birth: 1960-08-03           MRN: [id number] Visit Date: 10/13/2019 Requested by: Tara Side., FNP [place],  [place] [id number] PCP: Tara Side., FNP  Subjective: Chief Complaint  Patient presents with  . Follow-up    review scan    HPI: [name] is a 59 year old patient with right arm pain.  Since have seen her she has had an MRI scan of her cervical spine.  MRI scan shows progression of facet arthritis at C6-7.  She also describes some occasional lateral hip pain.  She also describes some decreased walking endurance.  She does have known lumbar spine problems for which she has received treatment in the past.  Currently her symptoms in the back and leg outweigh the symptoms in the neck and arm.              ROS: All systems reviewed are negative as they relate to the chief complaint within the history of present illness.  Patient denies  fevers or chills.   Assessment & Plan: Visit Diagnoses:  1. Spinal stenosis of lumbar region, unspecified whether neurogenic claudication present     Plan: Impression is facet arthritis C6-7 which is likely giving her some right arm pain.  She also has some left leg radiculopathy symptoms.  We have worked her hip up before and it looks pretty reasonable.  I think it would be worth considering another epidural steroid injection with Dr. Ernestina Patches.  We will make that referral and she will follow-up with [name] as needed  Follow-Up Instructions: No follow-ups on file.   Orders:  Orders Placed This Encounter  Procedures  . Ambulatory referral to Physical Medicine Rehab   No orders of the defined types were placed in this encounter.     Procedures: No procedures performed   Clinical Data: No additional findings.  Objective: Vital Signs: There were no vitals taken for this visit.  Physical Exam:   Constitutional: Patient appears well-developed HEENT:    Head: Normocephalic Eyes:EOM are normal Neck: Normal range of motion Cardiovascular: Normal rate Pulmonary/chest: Effort normal Neurologic: Patient is alert Skin: Skin is warm Psychiatric: Patient has normal mood and affect    Ortho Exam: Ortho exam demonstrates reasonable cervical spine range of motion.  Good strength in both arms to EPL FPL interosseous wrist flexion extension bicep triceps and deltoid testing.  No nerve root tension signs.  No groin pain with internal X rotation of either leg.  Leg lengths equal.  She has 5 out of 5 ankle dorsiflexion plantarflexion strength with mild pain with forward bending.  No trochanteric tenderness is present.  Specialty Comments:  No specialty comments available.  Imaging: No results found.   PMFS History: Patient Active Problem List   Diagnosis Date Noted  . Unilateral primary osteoarthritis, left hip   . Hip arthritis 06/11/2018  . Class 3 severe obesity with serious comorbidity and body mass index (BMI) of 40.0 to 44.9 in adult ([place]) 05/29/2018  . Other fatigue 11/20/2017  . Shortness of breath on exertion 11/20/2017  . Type 2 diabetes mellitus without complication, without long-term current use of insulin ([place]) 11/20/2017  . Vitamin D deficiency 11/20/2017  . Depression 11/20/2017  . Other hyperlipidemia 11/20/2017  . 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 Presence of left artificial knee joint 09/20/2016  .  Arthritis of knee 08/22/2016  . Cervical radiculopathy 07/26/2016  . Cervical disc disorder with radiculopathy 07/26/2016  . Right arm pain 06/29/2016  . Cervicalgia 06/29/2016  . Primary osteoarthritis of left knee 06/29/2016  . Hypersomnia with sleep apnea 11/18/2015  . Lethargy 11/18/2015  . Chest pain 09/30/2014  . Abnormal cardiac function test 09/29/2014  . Chest pain with moderate risk for cardiac etiology 09/28/2014  . HTN (hypertension) 09/28/2014  . Hypokalemia   . Paresthesia 06/18/2014  . Numbness and  tingling of left arm and leg   . Unstable angina ([place]) 05/24/2014  . OSA on CPAP 05/24/2014  . S/P laparoscopic appendectomy 06/04/2012  . Diastolic CHF ([place]) 00/34/9179  . Mediastinal mass 01/09/2012  . SOB (shortness of breath) 06/20/2011  . Mediastinal abnormality 06/20/2011  . Dyslipidemia 12/23/2009  . GLAUCOMA 12/23/2009  . ARTHRITIS 12/23/2009  . 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 Class 2 severe obesity with serious comorbidity and body mass index (BMI) of 38.0 to 38.9 in adult ([place]) 09/13/2006  . ANXIETY STATE NOS 09/13/2006  . DISORDER, DEPRESSIVE NEC 09/13/2006  . CARPAL TUNNEL SYNDROME, MILD 09/13/2006  . Unspecified essential hypertension 09/13/2006  . IBS 09/13/2006  . DEGENERATION, LUMBAR/LUMBOSACRAL DISC 09/13/2006  . SYMPTOM, SWELLING/MASS/LUMP IN CHEST 09/13/2006  . PULMONARY EMBOLISM, HX OF 09/13/2006   Past Medical History:  Diagnosis Date  . Anginal pain ([place])    a. NL cath in 2008;  b. Myoview 03/2011: dec uptake along mid anterior wall on stress imaging -> ? attenuation vs. ischemia, EF 65%;  c. Echo 04/2011: EF 55-60%, no RWMA, Gr 2 dd  . Anxiety   . Arthritis   . Asthma   . Back pain   . Bone cancer ([place])   . Cancer ([place])   . Chest pain   . CHF (congestive heart failure) ([place])   . CHF (congestive heart failure) ([place])   . Depression   . Diabetes mellitus   . Drug use   . Dyspnea    with exertion  . Dyspnea   . Frequent urination   . GERD (gastroesophageal reflux disease)   . Glaucoma   . HLD (hyperlipidemia)   . Hypertension   . IBS (irritable bowel syndrome)   . Joint pain   . Lactose intolerance   . Leg edema   . 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 Obesity   . Palpitations   . Pneumonia 05/2016   double  . Pulmonary edema   . Pulmonary embolism ([place])    a. 2008 -> coumadin x 6 mos.  . Rheumatoid arthritis ([place])   . Sleep apnea    on CPAP 02/2018  . TIA (transient ischemic attack)   . Urinary urgency     Family  History  Problem Relation Age of Onset  . Emphysema Mother   . Arthritis Mother   . Heart failure Mother        alive @ 64  . Stroke Mother   . Diabetes Mother   . Hypertension Mother   . Hyperlipidemia Mother   . Depression Mother   . Anxiety disorder Mother   . Asthma Brother   . Heart disease Father        died @ 63's.  . Stroke Father   . Diabetes Father   . Hyperlipidemia Father   . Hypertension Father   . Bipolar disorder Father   . Sleep apnea Father   . Alcoholism Father   .  Drug abuse Father   . Diabetes Sister   . Heart disease Paternal Grandfather   . Colon cancer Maternal Grandfather   . Breast cancer Maternal Aunt     Past Surgical History:  Procedure Laterality Date  . ABDOMINAL HYSTERECTOMY  2005  . APPENDECTOMY    . CARDIAC CATHETERIZATION     Normal  . CARDIAC CATHETERIZATION N/A 09/30/2014   Procedure: Left Heart Cath and Coronary Angiography;  Surgeon: Sherren Mocha, MD;  Location: [facility];  Service: Cardiovascular;  Laterality: N/A;  . LAPAROSCOPIC APPENDECTOMY N/A 06/03/2012   Procedure: APPENDECTOMY LAPAROSCOPIC;  Surgeon: Stark Klein, MD;  Location: [facility];  Service: General;  Laterality: N/A;  . Left knee surgery  2008  . LEG SURGERY    . TONSILLECTOMY    . TOTAL HIP ARTHROPLASTY Left 06/11/2018   Procedure: LEFT TOTAL HIP ARTHROPLASTY ANTERIOR APPROACH;  Surgeon: Meredith Pel, MD;  Location: [facility];  Service: Orthopedics;  Laterality: Left;  . TOTAL KNEE ARTHROPLASTY Left 08/22/2016   Procedure: TOTAL KNEE ARTHROPLASTY;  Surgeon: Meredith Pel, MD;  Location: [facility];  Service: Orthopedics;  Laterality: Left;  . TUBAL LIGATION  1989   Social History   Occupational History  . Occupation: UNEMPLOYED    Employer: DISABLED  Tobacco Use  . Smoking status: Former Smoker    Packs/day: 0.50    Years: 15.00    Pack years: 7.50    Types: Cigarettes    Quit date: 04/24/1985    Years since quitting: 34.4  . Smokeless tobacco:  Never Used  Substance and Sexual Activity  . Alcohol use: No    Alcohol/week: 0.0 standard drinks  . Drug use: Not Currently    Types: Marijuana  . Sexual activity: Never

## 2019-10-23 ENCOUNTER — Encounter (INDEPENDENT_AMBULATORY_CARE_PROVIDER_SITE_OTHER): Payer: Self-pay

## 2019-10-28 ENCOUNTER — Other Ambulatory Visit: Payer: Self-pay

## 2019-10-28 ENCOUNTER — Encounter (INDEPENDENT_AMBULATORY_CARE_PROVIDER_SITE_OTHER): Payer: Self-pay | Admitting: Family Medicine

## 2019-10-28 ENCOUNTER — Ambulatory Visit (INDEPENDENT_AMBULATORY_CARE_PROVIDER_SITE_OTHER): Payer: Medicare Other | Admitting: Family Medicine

## 2019-10-28 VITALS — BP 106/67 | HR 69 | Temp 97.9°F | Ht 59.0 in | Wt 223.0 lb

## 2019-10-28 DIAGNOSIS — Z6841 Body Mass Index (BMI) 40.0 and over, adult: Secondary | ICD-10-CM | POA: Diagnosis not present

## 2019-10-28 DIAGNOSIS — F3289 Other specified depressive episodes: Secondary | ICD-10-CM

## 2019-10-28 DIAGNOSIS — E1169 Type 2 diabetes mellitus with other specified complication: Secondary | ICD-10-CM

## 2019-10-28 DIAGNOSIS — E559 Vitamin D deficiency, unspecified: Secondary | ICD-10-CM

## 2019-10-28 MED ORDER — OZEMPIC (0.25 OR 0.5 MG/DOSE) 2 MG/1.5ML ~~LOC~~ SOPN: 0.2500 mg | PEN_INJECTOR | SUBCUTANEOUS | 0 refills | Status: DC

## 2019-10-28 MED ORDER — VITAMIN D 125 MCG (5000 UT) PO CAPS: 2.0000 | ORAL_CAPSULE | Freq: Every day | ORAL | 0 refills | Status: DC

## 2019-10-30 NOTE — Progress Notes (Signed)
Chief Complaint:   OBESITY [name] is here to discuss her progress with her obesity treatment plan along with follow-up of her obesity related diagnoses. [name] is on the Category 2 Plan and states she is following her eating plan approximately 80% of the time. [name] states she is walking for 15-20 minutes 3-4 times per week.  Today's visit was #: 2 Starting weight: 225 lbs Starting date: 11/20/2017 Today's weight: 223 lbs Today's date: 10/28/2019 Total lbs lost to date: 2 Total lbs lost since last in-office visit: 0  Interim History: [name] recently celebrated her birthday and was off plan. She also notes some emotional eating. She is skipping meals and not getting enough protein.  Subjective:   1. Vitamin D deficiency [name]'s last Vit D level has decreased to 31.8 from 36.9. She is on weekly prescription Vit D. I discussed labs with the patient today.  2. Type 2 diabetes mellitus without complication, without long-term current use of insulin ([place]) [name]'s last A1c was up to 7.4 from 7.0 in December 2020. She is on Janumet. I discussed labs with the patient today.  Lab Results  Component Value Date   HGBA1C 7.5 (H) 10/07/2019   HGBA1C 6.3 (H) 12/26/2018   HGBA1C 6.6 (H) 05/22/2018   Lab Results  Component Value Date   MICROALBUR 0.51 03/01/2010   LDLCALC 41 12/26/2018   CREATININE 0.96 09/27/2019   Lab Results  Component Value Date   INSULIN 28.1 (H) 12/26/2018   INSULIN 20.2 03/04/2018   INSULIN 53.9 (H) 11/20/2017   3. Other depression with emotional eating [name] notes significant emotional eating due to family stress. She notes she has been referred to therapy by her primary care physician.  Assessment/Plan:   1. Vitamin D deficiency Low Vitamin D level contributes to fatigue and are associated with obesity, breast, and colon cancer. [name] agreed to discontinue prescription Vitamin D, and start 10,000 IU Vit D3 OTC daily. She will follow-up for routine  testing of Vitamin D, at least 2-3 times per year to avoid over-replacement.  2. Type 2 diabetes mellitus without complication, without long-term current use of insulin ([name])  [name] will continue Janumet, and she agreed to start Ozempic 0.25 mg weekly with no refills.  - Semaglutide,0.25 or 0.5MG /DOS, (OZEMPIC, 0.25 OR 0.5 MG/DOSE,) 2 MG/1.5ML SOPN; Inject 0.1875 mLs (0.25 mg total) into the skin once a week.  Dispense: 1 pen; Refill: 0  3. Other depression with emotional eating . [name] will follow up on her referral for counseling from her primary care physician.   4. Class 3 severe obesity with serious comorbidity and body mass index (BMI) of 45.0 to 49.9 in adult, unspecified obesity type ([facility]) [name] is currently in the action stage of change. As such, her goal is to continue with weight loss efforts. She has agreed to the Category 2 Plan.   Exercise goals: All adults should avoid inactivity. Some physical activity is better than none, and adults who participate in any amount of physical activity gain some health benefits.  Behavioral modification strategies: increasing lean protein intake and decreasing simple carbohydrates.  [name] has agreed to follow-up with our clinic in 3 weeks. She was informed of the importance of frequent follow-up visits to maximize her success with intensive lifestyle modifications for her multiple health conditions.   Objective:   Blood pressure 106/67, pulse 69, temperature 97.9 F (36.6 C), temperature source Oral, height 4\' 11"  (1.499 m), weight 223 lb (101.2 kg), SpO2 96 %. Body  mass index is 45.04 kg/m.  General: Cooperative, alert, well developed, in no acute distress. HEENT: Conjunctivae and lids unremarkable. Cardiovascular: Regular rhythm.  Lungs: Normal work of breathing. Neurologic: No focal deficits.   Lab Results  Component Value Date   CREATININE 0.96 09/27/2019   BUN 10 09/27/2019   NA 141 09/27/2019   K 3.7 09/27/2019   CL 104  09/27/2019   CO2 29 09/27/2019   Lab Results  Component Value Date   ALT 33 08/14/2019   AST 17 08/14/2019   ALKPHOS 68 08/14/2019   BILITOT 0.5 08/14/2019   Lab Results  Component Value Date   HGBA1C 7.5 (H) 10/07/2019   HGBA1C 6.3 (H) 12/26/2018   HGBA1C 6.6 (H) 05/22/2018   HGBA1C 6.8 (H) 03/04/2018   HGBA1C 8.8 (H) 11/20/2017   Lab Results  Component Value Date   INSULIN 28.1 (H) 12/26/2018   INSULIN 20.2 03/04/2018   INSULIN 53.9 (H) 11/20/2017   Lab Results  Component Value Date   TSH 0.458 03/05/2019   Lab Results  Component Value Date   CHOL 119 12/26/2018   HDL 58 12/26/2018   LDLCALC 41 12/26/2018   TRIG 108 12/26/2018   CHOLHDL 4.2 04/23/2011   CHOLHDL 4.3 04/23/2011   Lab Results  Component Value Date   WBC 6.4 09/27/2019   HGB 12.0 09/27/2019   HCT 37.7 09/27/2019   MCV 88.7 09/27/2019   PLT 217 09/27/2019   No results found for: IRON, TIBC, FERRITIN  Obesity Behavioral Intervention Documentation for Insurance:   Approximately 15 minutes were spent on the discussion below.  ASK: We discussed the diagnosis of obesity with [name] today and [name] agreed to give [name] permission to discuss obesity behavioral modification therapy today.  ASSESS: [name] has the diagnosis of obesity and her BMI today is 45.02. [name] is in the action stage of change.   ADVISE: [name] was educated on the multiple health risks of obesity as well as the benefit of weight loss to improve her health. She was advised of the need for long term treatment and the importance of lifestyle modifications to improve her current health and to decrease her risk of future health problems.  AGREE: Multiple dietary modification options and treatment options were discussed and [name] agreed to follow the recommendations documented in the above note.  ARRANGE: [name] was educated on the importance of frequent visits to treat obesity as outlined per CMS and USPSTF guidelines and agreed  to schedule her next follow up appointment today.  Attestation Statements:   Reviewed by clinician on day of visit: allergies, medications, problem list, medical history, surgical history, family history, social history, and previous encounter notes.   [name], am acting as Location manager for [employer], FNP-C.  I have reviewed the above documentation for accuracy and completeness, and I agree with the above. -  Tara Fick, FNP

## 2019-11-07 ENCOUNTER — Telehealth: Payer: Self-pay | Admitting: Physical Medicine and Rehabilitation

## 2019-11-07 NOTE — Telephone Encounter (Signed)
Patient called. She would like a call back from  [place].  

## 2019-11-08 ENCOUNTER — Emergency Department (HOSPITAL_COMMUNITY): Payer: Medicare Other

## 2019-11-08 ENCOUNTER — Emergency Department (HOSPITAL_COMMUNITY)
Admission: EM | Admit: 2019-11-08 | Discharge: 2019-11-08 | Disposition: A | Discharge status: Home / Self Care | Payer: Medicare Other | Attending: Emergency Medicine | Admitting: Emergency Medicine

## 2019-11-08 DIAGNOSIS — Z87891 Personal history of nicotine dependence: Secondary | ICD-10-CM | POA: Diagnosis not present

## 2019-11-08 DIAGNOSIS — M549 Dorsalgia, unspecified: Secondary | ICD-10-CM | POA: Diagnosis not present

## 2019-11-08 DIAGNOSIS — I110 Hypertensive heart disease with heart failure: Secondary | ICD-10-CM | POA: Insufficient documentation

## 2019-11-08 DIAGNOSIS — Z85830 Personal history of malignant neoplasm of bone: Secondary | ICD-10-CM | POA: Insufficient documentation

## 2019-11-08 DIAGNOSIS — Z7984 Long term (current) use of oral hypoglycemic drugs: Secondary | ICD-10-CM | POA: Insufficient documentation

## 2019-11-08 DIAGNOSIS — Y92410 Unspecified street and highway as the place of occurrence of the external cause: Secondary | ICD-10-CM | POA: Diagnosis not present

## 2019-11-08 DIAGNOSIS — Z96642 Presence of left artificial hip joint: Secondary | ICD-10-CM | POA: Insufficient documentation

## 2019-11-08 DIAGNOSIS — Z79899 Other long term (current) drug therapy: Secondary | ICD-10-CM | POA: Diagnosis not present

## 2019-11-08 DIAGNOSIS — I5030 Unspecified diastolic (congestive) heart failure: Secondary | ICD-10-CM | POA: Insufficient documentation

## 2019-11-08 DIAGNOSIS — R52 Pain, unspecified: Secondary | ICD-10-CM

## 2019-11-08 DIAGNOSIS — Y9389 Activity, other specified: Secondary | ICD-10-CM | POA: Insufficient documentation

## 2019-11-08 DIAGNOSIS — Y998 Other external cause status: Secondary | ICD-10-CM | POA: Diagnosis not present

## 2019-11-08 DIAGNOSIS — S199XXA Unspecified injury of neck, initial encounter: Secondary | ICD-10-CM | POA: Diagnosis present

## 2019-11-08 DIAGNOSIS — Z8673 Personal history of transient ischemic attack (TIA), and cerebral infarction without residual deficits: Secondary | ICD-10-CM | POA: Insufficient documentation

## 2019-11-08 DIAGNOSIS — Z91040 Latex allergy status: Secondary | ICD-10-CM | POA: Insufficient documentation

## 2019-11-08 DIAGNOSIS — Z96652 Presence of left artificial knee joint: Secondary | ICD-10-CM | POA: Insufficient documentation

## 2019-11-08 DIAGNOSIS — S0990XA Unspecified injury of head, initial encounter: Secondary | ICD-10-CM | POA: Diagnosis not present

## 2019-11-08 DIAGNOSIS — S161XXA Strain of muscle, fascia and tendon at neck level, initial encounter: Secondary | ICD-10-CM | POA: Diagnosis not present

## 2019-11-08 MED ORDER — TRAMADOL HCL 50 MG PO TABS: 50.0000 mg | ORAL_TABLET | Freq: Four times a day (QID) | ORAL | 0 refills | Status: DC | PRN

## 2019-11-08 MED ORDER — ONDANSETRON 4 MG PO TBDP
4.0000 mg | ORAL_TABLET | Freq: Once | ORAL | Status: AC
  Administered 2019-11-08: 4 mg via ORAL
  Filled 2019-11-08: qty 1

## 2019-11-08 MED ORDER — CYCLOBENZAPRINE HCL 5 MG PO TABS
5.0000 mg | ORAL_TABLET | Freq: Three times a day (TID) | ORAL | 0 refills | Status: DC | PRN
Start: 2019-11-08 — End: 2019-12-21

## 2019-11-08 MED ORDER — OXYCODONE-ACETAMINOPHEN 5-325 MG PO TABS
2.0000 | ORAL_TABLET | Freq: Once | ORAL | Status: AC
  Administered 2019-11-08: 2 via ORAL
  Filled 2019-11-08: qty 2

## 2019-11-08 MED ORDER — MORPHINE SULFATE (PF) 4 MG/ML IV SOLN
4.0000 mg | Freq: Once | INTRAVENOUS | Status: DC
  Filled 2019-11-08: qty 1
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
## 2019-11-08 NOTE — ED Notes (Signed)
Patient verbalizes understanding of discharge instructions. Opportunity for questioning and answers were provided. Armband removed by staff, pt discharged from ED.  

## 2019-11-08 NOTE — ED Notes (Signed)
Patient transported to X-ray 

## 2019-11-08 NOTE — Discharge Instructions (Signed)
Take tylenol for pain   Take tramadol for severe pain   Take flexeril for muscle spasms   See your doctor   Return to ER if you have worse neck pain, headaches, vomiting, abdominal pain

## 2019-11-10 ENCOUNTER — Telehealth: Payer: Self-pay | Admitting: Physical Medicine and Rehabilitation

## 2019-11-10 NOTE — Telephone Encounter (Signed)
Pt called wanting to know what Dr.Newtons availability looked like for the afternoon of 11/13/19 and the whole day of 11/14/19.

## 2019-11-11 NOTE — Telephone Encounter (Signed)
Rescheduled

## 2019-11-13 ENCOUNTER — Encounter: Payer: Medicare Other | Admitting: Physical Medicine and Rehabilitation

## 2019-11-18 ENCOUNTER — Other Ambulatory Visit: Payer: Self-pay

## 2019-11-18 ENCOUNTER — Ambulatory Visit (INDEPENDENT_AMBULATORY_CARE_PROVIDER_SITE_OTHER): Payer: Medicare Other | Admitting: Family Medicine

## 2019-11-18 ENCOUNTER — Encounter (INDEPENDENT_AMBULATORY_CARE_PROVIDER_SITE_OTHER): Payer: Self-pay | Admitting: Family Medicine

## 2019-11-18 VITALS — BP 102/66 | HR 75 | Temp 98.1°F | Ht 59.0 in | Wt 217.0 lb

## 2019-11-18 DIAGNOSIS — I10 Essential (primary) hypertension: Secondary | ICD-10-CM

## 2019-11-18 DIAGNOSIS — E1169 Type 2 diabetes mellitus with other specified complication: Secondary | ICD-10-CM

## 2019-11-18 DIAGNOSIS — Z6841 Body Mass Index (BMI) 40.0 and over, adult: Secondary | ICD-10-CM | POA: Diagnosis not present

## 2019-11-18 MED ORDER — JANUMET XR 50-500 MG PO TB24: 1.0000 | ORAL_TABLET | Freq: Every day | ORAL | 0 refills | Status: DC

## 2019-11-18 MED ORDER — OZEMPIC (0.25 OR 0.5 MG/DOSE) 2 MG/1.5ML ~~LOC~~ SOPN: 0.2500 mg | PEN_INJECTOR | SUBCUTANEOUS | 0 refills | Status: DC

## 2019-11-19 ENCOUNTER — Encounter (INDEPENDENT_AMBULATORY_CARE_PROVIDER_SITE_OTHER): Payer: Self-pay | Admitting: Family Medicine

## 2019-11-19 NOTE — Progress Notes (Signed)
Chief Complaint:   OBESITY [name] is here to discuss her progress with her obesity treatment plan along with follow-up of her obesity related diagnoses. [name] is on the Category 2 Plan and states she is following her eating plan approximately 80% of the time. [name] states she is walking for 30 minutes 2 times per week.  Today's visit was #: 80 Starting weight: 225 lbs Starting date: 11/20/2017 Today's weight: 217 lbs Today's date: 11/18/2019 Total lbs lost to date: 8 Total lbs lost since last in-office visit: 6  Interim History: [name] still struggles to eat all of her meals. She is trying to get protein in when she eats. She notes she loses weight better when she eats regularly and eats more protein.  Subjective:   1. Type 2 diabetes mellitus with other specified complication, without long-term current use of insulin ([place]) [name]'s diabetes mellitus is not well controlled. Last A1c was 7.5. Ozempic was added at her last office visit and she is tolerating it well.  Lab Results  Component Value Date   HGBA1C 7.5 (H) 10/07/2019   HGBA1C 6.3 (H) 12/26/2018   HGBA1C 6.6 (H) 05/22/2018   Lab Results  Component Value Date   MICROALBUR 0.51 03/01/2010   LDLCALC 41 12/26/2018   CREATININE 0.96 09/27/2019   Lab Results  Component Value Date   INSULIN 28.1 (H) 12/26/2018   INSULIN 20.2 03/04/2018   INSULIN 53.9 (H) 11/20/2017   2. Essential hypertension [name]'s blood pressure is well controlled on Coreg, Lasix, and lisinopril. Cardiovascular ROS: no chest pain or dyspnea on exertion.  BP Readings from Last 3 Encounters:  11/18/19 102/66  11/08/19 125/66  10/28/19 106/67   Lab Results  Component Value Date   CREATININE 0.96 09/27/2019   CREATININE 0.93 09/25/2019   CREATININE 1.10 (H) 08/14/2019   Assessment/Plan:   1. Type 2 diabetes mellitus with other specified complication, without long-term current use of insulin (HCC) Good blood sugar control is important to  decrease the likelihood of diabetic complications such as nephropathy, neuropathy, limb loss, blindness, coronary artery disease, and death. Intensive lifestyle modification including diet, exercise and weight loss are the first line of treatment for diabetes. We will refill Janumet, and Ozempic for 1 month, and [name] will follow up as directed.  - Semaglutide,0.25 or 0.5MG /DOS, (OZEMPIC, 0.25 OR 0.5 MG/DOSE,) 2 MG/1.5ML SOPN; Inject 0.1875 mLs (0.25 mg total) into the skin once a week.  Dispense: 4 pen; Refill: 0 - SitaGLIPtin-MetFORMIN HCl (JANUMET XR) 50-500 MG TB24; Take 1 tablet by mouth daily.  Dispense: 30 tablet; Refill: 0  2. Essential hypertension [name] will continue all her medications, and will continue working on healthy weight loss and exercise to improve blood pressure control. We will watch for signs of hypotension as she continues her lifestyle modifications.   3. Class 3 severe obesity with serious comorbidity and body mass index (BMI) of 40.0 to 44.9 in adult, unspecified obesity type (HCC) [name] is currently in the action stage of change. As such, her goal is to continue with weight loss efforts. She has agreed to the Category 2 Plan.   Exercise goals: As is.  Behavioral modification strategies: increasing lean protein intake, increasing water intake and no skipping meals.  [name] has agreed to follow-up with our clinic in 3 weeks. She was informed of the importance of frequent follow-up visits to maximize her success with intensive lifestyle modifications for her multiple health conditions.   Objective:   Blood pressure 102/66, pulse  75, temperature 98.1 F (36.7 C), temperature source Oral, height 4\' 11"  (1.499 m), weight (!) 217 lb (98.4 kg), SpO2 95 %. Body mass index is 43.83 kg/m.  General: Cooperative, alert, well developed, in no acute distress. HEENT: Conjunctivae and lids unremarkable. Cardiovascular: Regular rhythm.  Lungs: Normal work of breathing.  Neurologic: No focal deficits.   Lab Results  Component Value Date   CREATININE 0.96 09/27/2019   BUN 10 09/27/2019   NA 141 09/27/2019   K 3.7 09/27/2019   CL 104 09/27/2019   CO2 29 09/27/2019   Lab Results  Component Value Date   ALT 33 08/14/2019   AST 17 08/14/2019   ALKPHOS 68 08/14/2019   BILITOT 0.5 08/14/2019   Lab Results  Component Value Date   HGBA1C 7.5 (H) 10/07/2019   HGBA1C 6.3 (H) 12/26/2018   HGBA1C 6.6 (H) 05/22/2018   HGBA1C 6.8 (H) 03/04/2018   HGBA1C 8.8 (H) 11/20/2017   Lab Results  Component Value Date   INSULIN 28.1 (H) 12/26/2018   INSULIN 20.2 03/04/2018   INSULIN 53.9 (H) 11/20/2017   Lab Results  Component Value Date   TSH 0.458 03/05/2019   Lab Results  Component Value Date   CHOL 119 12/26/2018   HDL 58 12/26/2018   LDLCALC 41 12/26/2018   TRIG 108 12/26/2018   CHOLHDL 4.2 04/23/2011   CHOLHDL 4.3 04/23/2011   Lab Results  Component Value Date   WBC 6.4 09/27/2019   HGB 12.0 09/27/2019   HCT 37.7 09/27/2019   MCV 88.7 09/27/2019   PLT 217 09/27/2019   No results found for: IRON, TIBC, FERRITIN  Obesity Behavioral Intervention Documentation for Insurance:   Approximately 15 minutes were spent on the discussion below.  ASK: We discussed the diagnosis of obesity with [name] today and [name] agreed to give [name] permission to discuss obesity behavioral modification therapy today.  ASSESS: [name] has the diagnosis of obesity and her BMI today is 43.81. [name] is in the action stage of change.   ADVISE: Dae was educated on the multiple health risks of obesity as well as the benefit of weight loss to improve her health. She was advised of the need for long term treatment and the importance of lifestyle modifications to improve her current health and to decrease her risk of future health problems.  AGREE: Multiple dietary modification options and treatment options were discussed and [name] agreed to follow the  recommendations documented in the above note.  ARRANGE: [name] was educated on the importance of frequent visits to treat obesity as outlined per CMS and USPSTF guidelines and agreed to schedule her next follow up appointment today.  Attestation Statements:   Reviewed by clinician on day of visit: allergies, medications, problem list, medical history, surgical history, family history, social history, and previous encounter notes.   Wilhemena Durie, am acting as Location manager for [employer], FNP-C.  I have reviewed the above documentation for accuracy and completeness, and I agree with the above. -  Georgianne Fick, FNP

## 2019-12-02 ENCOUNTER — Telehealth: Payer: Self-pay | Admitting: Physical Medicine and Rehabilitation

## 2019-12-02 ENCOUNTER — Encounter: Payer: Medicare Other | Admitting: Physical Medicine and Rehabilitation

## 2019-12-02 NOTE — Telephone Encounter (Signed)
Patient called needing to cancel her appointment.  Thew number to contact patient is [phone]

## 2019-12-02 NOTE — Telephone Encounter (Signed)
Documented in referral  

## 2019-12-09 ENCOUNTER — Ambulatory Visit (INDEPENDENT_AMBULATORY_CARE_PROVIDER_SITE_OTHER): Payer: Medicare Other | Admitting: Family Medicine

## 2019-12-10 ENCOUNTER — Telehealth: Payer: Self-pay | Admitting: Physical Medicine and Rehabilitation

## 2019-12-10 NOTE — Telephone Encounter (Signed)
Patient called and stated that she does not need an appt right now. Patient states she is feeling better. No need for a return phone call.

## 2019-12-10 NOTE — Telephone Encounter (Signed)
Referral closed per patient request.
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
## 2019-12-12 IMAGING — DX DG LUMBAR SPINE COMPLETE 4+V
5 series · 5 of 5 positions shown · non-contrast
Comparison: 04/27/2014 radiographs

CLINICAL DATA: Back pain after fall.

EXAM:
LUMBAR SPINE - COMPLETE 4+ VIEW

[t lumbar spine ap]
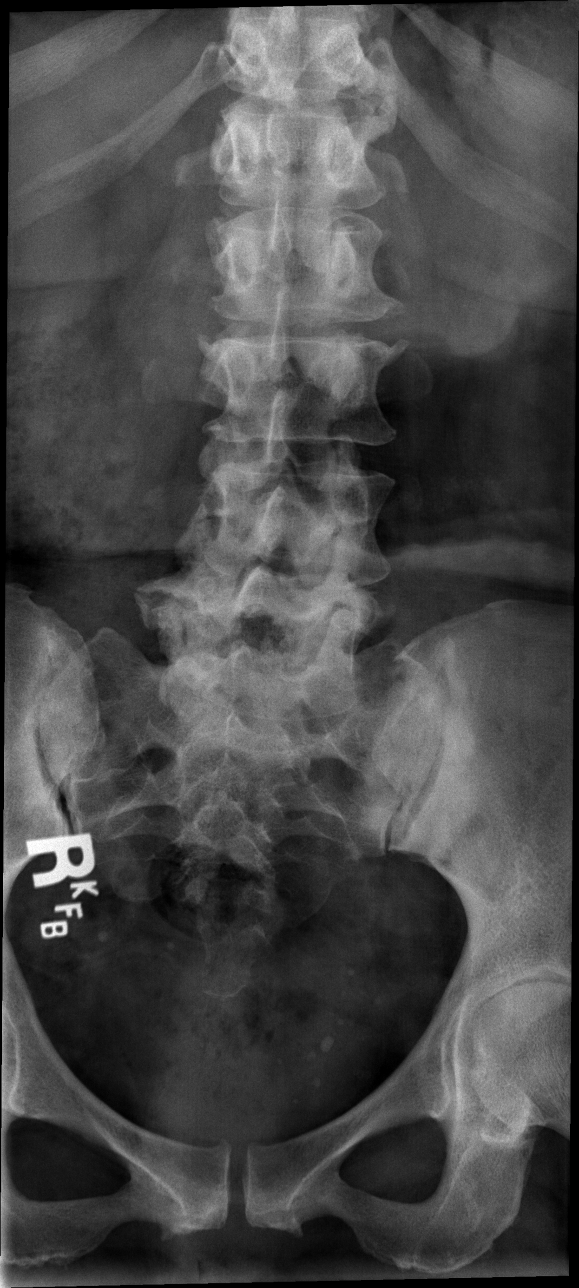

[t lumbar spine obl (1 of 2)]
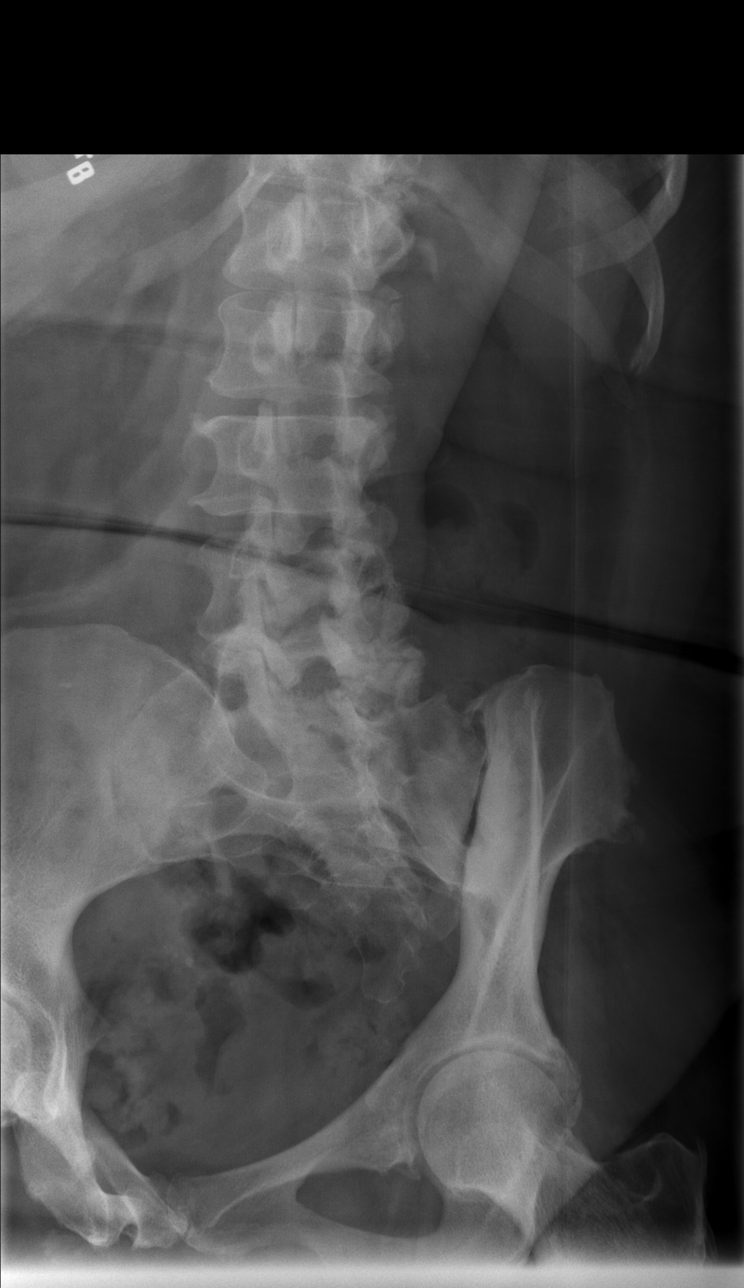

[t lumbar spine obl (2 of 2)]
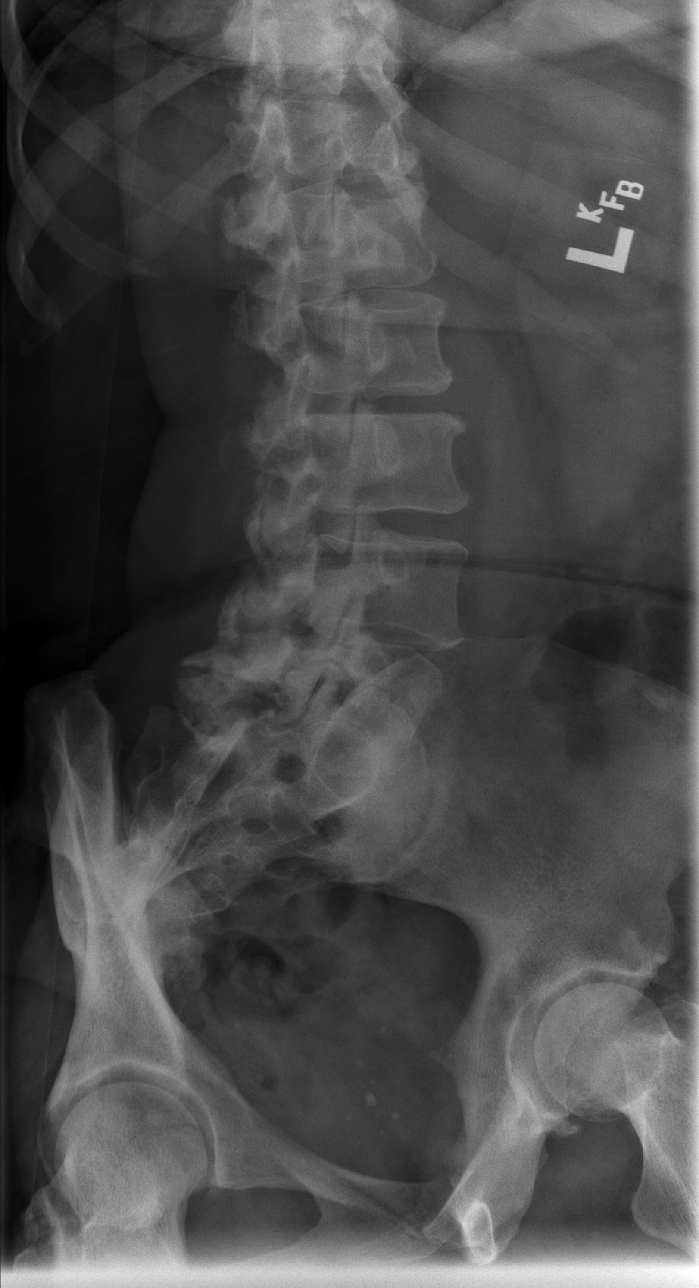

[t lumbar spine lat]
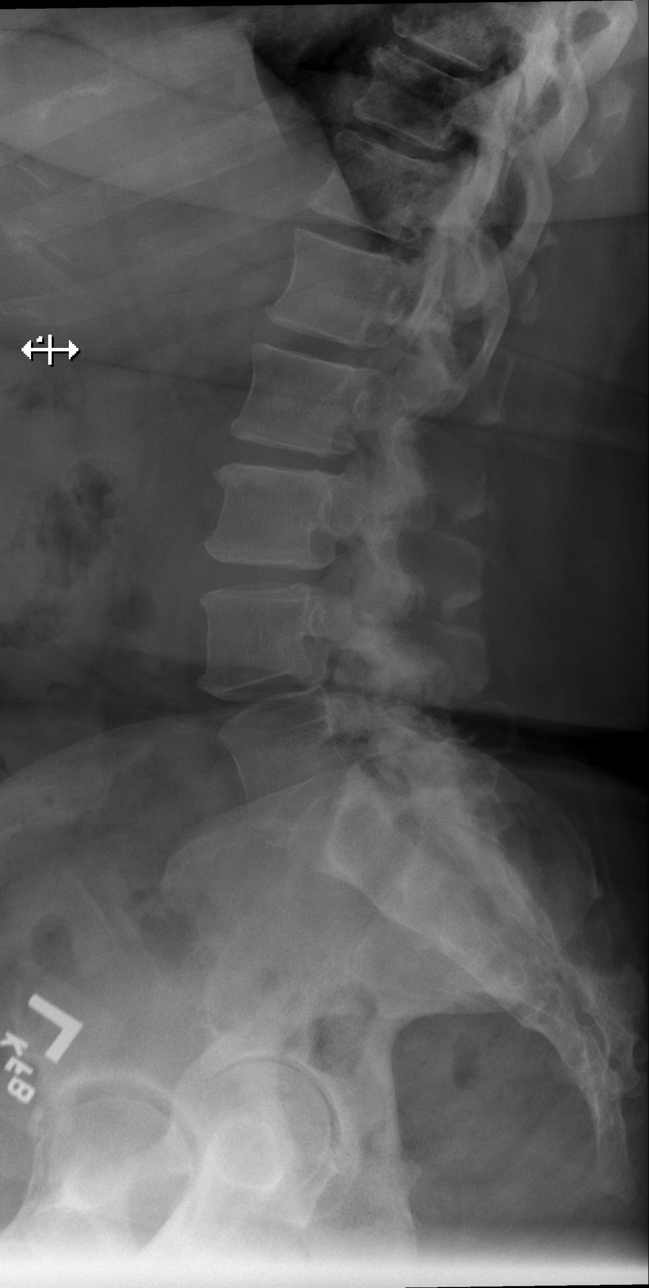

[t lumbar l-5 s-1 spot]
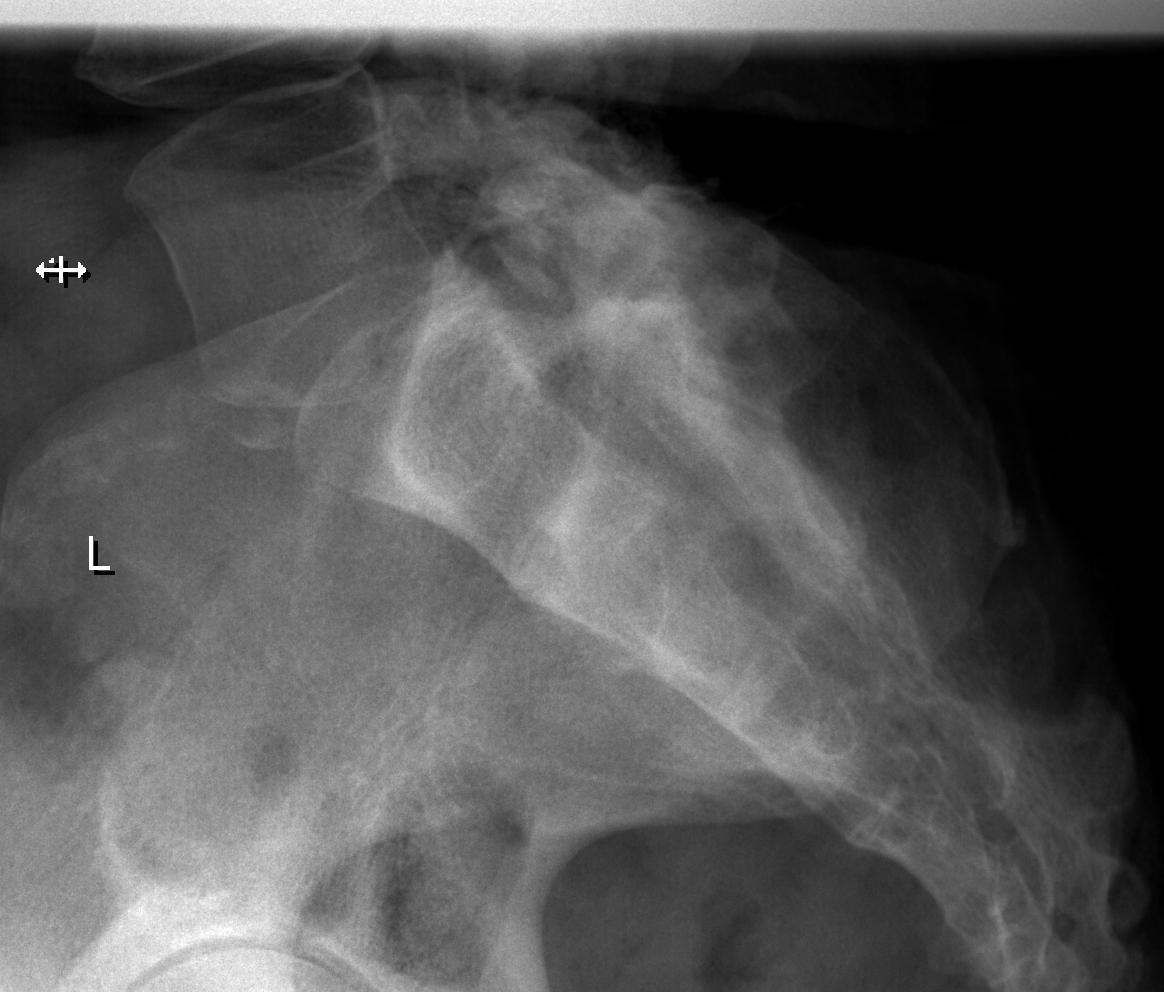

[5 of 5 positions shown; findings below may reference images not displayed]

FINDINGS: Slight levoconvex curvature of the lumbar spine. Sclerosis about the
SI joints compatible mild-to-moderate bilateral sacroiliitis. Joint
space narrowing sclerosis of the lumbar facets approximately a L2
through S1. Mild degenerative disc disease L2 through S1. Joint
space narrowing of the included hips. No acute fracture or
suspicious osseous lesions.
IMPRESSION: Lumbar spondylosis with levoconvex curvature of the lumbar spine and
facet arthropathy. No acute osseous abnormality. Sclerosis of the SI
joints compatible with chronic changes of sacroiliitis.

## 2019-12-21 ENCOUNTER — Emergency Department (HOSPITAL_COMMUNITY)
Admission: EM | Admit: 2019-12-21 | Discharge: 2019-12-21 | Disposition: A | Discharge status: Home / Self Care | Payer: Medicare Other | Attending: Emergency Medicine | Admitting: Emergency Medicine

## 2019-12-21 ENCOUNTER — Encounter (HOSPITAL_COMMUNITY): Payer: Self-pay

## 2019-12-21 DIAGNOSIS — I5030 Unspecified diastolic (congestive) heart failure: Secondary | ICD-10-CM | POA: Diagnosis not present

## 2019-12-21 DIAGNOSIS — Z96652 Presence of left artificial knee joint: Secondary | ICD-10-CM | POA: Diagnosis not present

## 2019-12-21 DIAGNOSIS — I110 Hypertensive heart disease with heart failure: Secondary | ICD-10-CM | POA: Insufficient documentation

## 2019-12-21 DIAGNOSIS — E119 Type 2 diabetes mellitus without complications: Secondary | ICD-10-CM | POA: Diagnosis not present

## 2019-12-21 DIAGNOSIS — Z96642 Presence of left artificial hip joint: Secondary | ICD-10-CM | POA: Insufficient documentation

## 2019-12-21 DIAGNOSIS — Z79899 Other long term (current) drug therapy: Secondary | ICD-10-CM | POA: Diagnosis not present

## 2019-12-21 DIAGNOSIS — Z7982 Long term (current) use of aspirin: Secondary | ICD-10-CM | POA: Diagnosis not present

## 2019-12-21 DIAGNOSIS — Z91040 Latex allergy status: Secondary | ICD-10-CM | POA: Insufficient documentation

## 2019-12-21 DIAGNOSIS — J45909 Unspecified asthma, uncomplicated: Secondary | ICD-10-CM | POA: Diagnosis not present

## 2019-12-21 DIAGNOSIS — M62838 Other muscle spasm: Secondary | ICD-10-CM | POA: Diagnosis not present

## 2019-12-21 DIAGNOSIS — Z87891 Personal history of nicotine dependence: Secondary | ICD-10-CM | POA: Diagnosis not present

## 2019-12-21 LAB — CBC
HCT: 39 % (ref 36.0–46.0)
Hemoglobin: 12.3 g/dL (ref 12.0–15.0)
MCH: 27.7 pg (ref 26.0–34.0)
MCHC: 31.5 g/dL (ref 30.0–36.0)
MCV: 87.8 fL (ref 80.0–100.0)
Platelets: 241 10*3/uL (ref 150–400)
RBC: 4.44 MIL/uL (ref 3.87–5.11)
RDW: 13.8 % (ref 11.5–15.5)
WBC: 7.1 10*3/uL (ref 4.0–10.5)
nRBC: 0 % (ref 0.0–0.2)

## 2019-12-21 LAB — BASIC METABOLIC PANEL
Anion gap: 12 (ref 5–15)
BUN: 10 mg/dL (ref 6–20)
CO2: 31 mmol/L (ref 22–32)
Calcium: 9.6 mg/dL (ref 8.9–10.3)
Chloride: 101 mmol/L (ref 98–111)
Creatinine, Ser: 0.98 mg/dL (ref 0.44–1.00)
GFR calc Af Amer: >60 mL/min (ref >60)
GFR calc non Af Amer: >60 mL/min (ref >60)
Glucose, Bld: 106 mg/dL — ABNORMAL HIGH (ref 70–99)
Potassium: 3.5 mmol/L (ref 3.5–5.1)
Sodium: 144 mmol/L (ref 135–145)

## 2019-12-21 MED ORDER — CYCLOBENZAPRINE HCL 10 MG PO TABS
10.0000 mg | ORAL_TABLET | Freq: Every day | ORAL | 0 refills | Status: DC
Start: 2019-12-21 — End: 2021-03-08

## 2019-12-21 MED ORDER — CYCLOBENZAPRINE HCL 10 MG PO TABS
5.0000 mg | ORAL_TABLET | Freq: Once | ORAL | Status: AC
  Administered 2019-12-21: 5 mg via ORAL
  Filled 2019-12-21: qty 1

## 2019-12-21 NOTE — ED Provider Notes (Signed)
[facility] EMERGENCY DEPARTMENT Provider Note   CSN: [id number] Arrival date & time: 12/21/19  0110     History Chief Complaint  Patient presents with  . Spasms    [name] is a 59 y.o. female.  HPI Patient is a 59 year old female with a medical history as noted below.  Patient states that yesterday evening she began experiencing muscle spasms in the bilateral hamstrings that started in the right leg and then also began in the left leg.  Patient states that she has experienced muscle spasms many times in the past but that they typically occur in her calves and was concerned so she came to the emergency department via [facility] for further evaluation.  Patient has a history of CHF and notes waxing and waning leg swelling but denies any recent leg swelling or calf pain.  No acute shortness of breath.  No chest pain.  Her pain worsens with palpation and flexion of both legs.  She has not taken anything for her symptoms.  No fevers, chills, abdominal pain, vomiting, numbness.    Past Medical History:  Diagnosis Date  . Anginal pain ([place])    a. NL cath in 2008;  b. Myoview 03/2011: dec uptake along mid anterior wall on stress imaging -> ? attenuation vs. ischemia, EF 65%;  c. Echo 04/2011: EF 55-60%, no RWMA, Gr 2 dd  . Anxiety   . Arthritis   . Asthma   . Back pain   . Bone cancer ([place])   . Cancer ([place])   . Chest pain   . CHF (congestive heart failure) ([place])   . CHF (congestive heart failure) ([place])   . Depression   . Diabetes mellitus   . Drug use   . Dyspnea    with exertion  . Dyspnea   . Frequent urination   . GERD (gastroesophageal reflux disease)   . Glaucoma   . HLD (hyperlipidemia)   . Hypertension   . IBS (irritable bowel syndrome)   . Joint pain   . Lactose intolerance   . Leg edema   . 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 Obesity   . Palpitations   . Pneumonia 05/2016   double  . Pulmonary edema   . Pulmonary  embolism ([place])    a. 2008 -> coumadin x 6 mos.  . Rheumatoid arthritis ([place])   . Sleep apnea    on CPAP 02/2018  . TIA (transient ischemic attack)   . Urinary urgency     Patient Active Problem List   Diagnosis Date Noted  . Unilateral primary osteoarthritis, left hip   . Hip arthritis 06/11/2018  . Class 3 severe obesity with serious comorbidity and body mass index (BMI) of 40.0 to 44.9 in adult ([place]) 05/29/2018  . Other fatigue 11/20/2017  . Shortness of breath on exertion 11/20/2017  . Diabetes mellitus ([place]) 11/20/2017  . Vitamin D deficiency 11/20/2017  . Depression 11/20/2017  . Other hyperlipidemia 11/20/2017  . 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 Presence of left artificial knee joint 09/20/2016  . Arthritis of knee 08/22/2016  . Cervical radiculopathy 07/26/2016  . Cervical disc disorder with radiculopathy 07/26/2016  . Right arm pain 06/29/2016  . Cervicalgia 06/29/2016  . Primary osteoarthritis of left knee 06/29/2016  . Hypersomnia with sleep apnea 11/18/2015  . Lethargy 11/18/2015  . Chest pain 09/30/2014  . Abnormal cardiac function test 09/29/2014  . Chest pain with  moderate risk for cardiac etiology 09/28/2014  . HTN (hypertension) 09/28/2014  . Hypokalemia   . Paresthesia 06/18/2014  . Numbness and tingling of left arm and leg   . Unstable angina ([place]) 05/24/2014  . OSA on CPAP 05/24/2014  . S/P laparoscopic appendectomy 06/04/2012  . Diastolic CHF ([place]) 53/66/4403  . Mediastinal mass 01/09/2012  . SOB (shortness of breath) 06/20/2011  . Mediastinal abnormality 06/20/2011  . Dyslipidemia 12/23/2009  . GLAUCOMA 12/23/2009  . ARTHRITIS 12/23/2009  . 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 Class 2 severe obesity with serious comorbidity and body mass index (BMI) of 38.0 to 38.9 in adult ([place]) 09/13/2006  . ANXIETY STATE NOS 09/13/2006  . DISORDER, DEPRESSIVE NEC 09/13/2006  . CARPAL TUNNEL SYNDROME, MILD 09/13/2006  . Unspecified essential hypertension  09/13/2006  . IBS 09/13/2006  . DEGENERATION, LUMBAR/LUMBOSACRAL DISC 09/13/2006  . SYMPTOM, SWELLING/MASS/LUMP IN CHEST 09/13/2006  . PULMONARY EMBOLISM, HX OF 09/13/2006    Past Surgical History:  Procedure Laterality Date  . ABDOMINAL HYSTERECTOMY  2005  . APPENDECTOMY    . CARDIAC CATHETERIZATION     Normal  . CARDIAC CATHETERIZATION N/A 09/30/2014   Procedure: Left Heart Cath and Coronary Angiography;  Surgeon: Sherren Mocha, MD;  Location: [facility];  Service: Cardiovascular;  Laterality: N/A;  . LAPAROSCOPIC APPENDECTOMY N/A 06/03/2012   Procedure: APPENDECTOMY LAPAROSCOPIC;  Surgeon: Stark Klein, MD;  Location: [facility];  Service: General;  Laterality: N/A;  . Left knee surgery  2008  . LEG SURGERY    . TONSILLECTOMY    . TOTAL HIP ARTHROPLASTY Left 06/11/2018   Procedure: LEFT TOTAL HIP ARTHROPLASTY ANTERIOR APPROACH;  Surgeon: Meredith Pel, MD;  Location: [facility];  Service: Orthopedics;  Laterality: Left;  . TOTAL KNEE ARTHROPLASTY Left 08/22/2016   Procedure: TOTAL KNEE ARTHROPLASTY;  Surgeon: Meredith Pel, MD;  Location: [facility];  Service: Orthopedics;  Laterality: Left;  . TUBAL LIGATION  1989     OB History    Gravida  3   Para      Term      Preterm      AB      Living        SAB      TAB      Ectopic      Multiple      Live Births              Family History  Problem Relation Age of Onset  . Emphysema Mother   . Arthritis Mother   . Heart failure Mother        alive @ 47  . Stroke Mother   . Diabetes Mother   . Hypertension Mother   . Hyperlipidemia Mother   . Depression Mother   . Anxiety disorder Mother   . Asthma Brother   . Heart disease Father        died @ 48's.  . Stroke Father   . Diabetes Father   . Hyperlipidemia Father   . Hypertension Father   . Bipolar disorder Father   . Sleep apnea Father   . Alcoholism Father   . Drug abuse Father   . Diabetes Sister   . Heart disease Paternal Grandfather   .  Colon cancer Maternal Grandfather   . Breast cancer Maternal Aunt     Social History   Tobacco Use  . Smoking status: Former Smoker    Packs/day: 0.50    Years: 15.00  Pack years: 7.50    Types: Cigarettes    Quit date: 04/24/1985    Years since quitting: 34.6  . Smokeless tobacco: Never Used  Substance Use Topics  . Alcohol use: No    Alcohol/week: 0.0 standard drinks  . Drug use: Not Currently    Types: Marijuana    Home Medications Prior to Admission medications   Medication Sig Start Date End Date Taking? Authorizing Provider  albuterol (PROVENTIL HFA;VENTOLIN HFA) 108 (90 Base) MCG/ACT inhaler Inhale 2 puffs into the lungs every 6 (six) hours as needed for wheezing or shortness of breath.    [provider]  aspirin 81 MG chewable tablet Chew 81 mg by mouth daily.    [provider]  atorvastatin (LIPITOR) 40 MG tablet Take 40 mg by mouth at bedtime.     [provider]  carvedilol (COREG) 12.5 MG tablet Take 12.5 mg by mouth at bedtime. 08/11/19   [provider]  Cholecalciferol (VITAMIN D) 125 MCG (5000 UT) CAPS Take 2 capsules by mouth daily. 10/28/19   Whitmire, Joneen Boers, FNP  cyclobenzaprine (FLEXERIL) 5 MG tablet Take 1 tablet (5 mg total) by mouth 3 (three) times daily as needed. 11/08/19   Drenda Freeze, MD  diclofenac Sodium (VOLTAREN) 1 % GEL Apply 2 g topically 4 (four) times daily. 09/20/19   [provider]  escitalopram (LEXAPRO) 20 MG tablet Take 20 mg by mouth daily. 02/21/19   [provider]  furosemide (LASIX) 40 MG tablet Take 40 mg by mouth daily.    Rai, Ripudeep K, MD  isosorbide dinitrate (ISORDIL) 20 MG tablet Take 20 mg by mouth 2 (two) times daily.     [provider]  lisinopril (PRINIVIL,ZESTRIL) 20 MG tablet Take 20 mg by mouth daily.  09/21/14   [provider]  loperamide (IMODIUM A-D) 2 MG tablet Take 4 mg by mouth 4 (four) times daily as needed for diarrhea or loose stools.      [provider]  MYRBETRIQ 25 MG TB24 tablet Take 1 tablet (25 mg total) by mouth daily. 03/13/19   Dennard Nip D, MD  omeprazole (PRILOSEC) 20 MG capsule Take 20 mg by mouth 2 (two) times daily. 10/05/16   [provider]  potassium chloride (KLOR-CON M10) 10 MEQ tablet Take 1 tablet (10 mEq total) by mouth 2 (two) times daily. 05/27/18   Jearld Lesch A, DO  Semaglutide,0.25 or 0.5MG /DOS, (OZEMPIC, 0.25 OR 0.5 MG/DOSE,) 2 MG/1.5ML SOPN Inject 0.1875 mLs (0.25 mg total) into the skin once a week. 11/18/19   Whitmire, Joneen Boers, FNP  SitaGLIPtin-MetFORMIN HCl (JANUMET XR) 50-500 MG TB24 Take 1 tablet by mouth daily. 11/18/19   Whitmire, Joneen Boers, FNP  traMADol (ULTRAM) 50 MG tablet Take 1 tablet (50 mg total) by mouth every 6 (six) hours as needed. 11/08/19   Drenda Freeze, MD  traZODone (DESYREL) 50 MG tablet Take 1 tablet by mouth at bedtime. 09/09/19   [provider]  KOMBIGLYZE XR 5-500 MG TB24 Take 1 tablet by mouth daily.  10/11/15 11/26/18  [provider]    Allergies    Penicillins, Hydrocodone-acetaminophen, Latex, and Sulfa antibiotics  Review of Systems   Review of Systems  All other systems reviewed and are negative.  Ten systems reviewed and are negative for acute change, except as noted in the HPI.   Physical Exam Updated Vital Signs BP (!) 122/58 (BP Location: Left Arm)   Pulse 71   Temp 98.1  F (36.7 C) (Oral)   Resp 16   SpO2 97%   Physical Exam Vitals and nursing note reviewed.  Constitutional:      General: She is not in acute distress.    Appearance: Normal appearance. She is not ill-appearing, toxic-appearing or diaphoretic.  HENT:     Head: Normocephalic and atraumatic.     Right Ear: External ear normal.     Left Ear: External ear normal.     Nose: Nose normal.     Mouth/Throat:     Mouth: Mucous membranes are moist.     Pharynx: Oropharynx is clear. No oropharyngeal exudate or posterior oropharyngeal erythema.  Eyes:       Extraocular Movements: Extraocular movements intact.  Cardiovascular:     Rate and Rhythm: Normal rate and regular rhythm.     Pulses: Normal pulses.     Heart sounds: Normal heart sounds. No murmur heard.  No friction rub. No gallop.      Comments: Regular rate and rhythm.  No murmurs, rubs, gallops. Pulmonary:     Effort: Pulmonary effort is normal. No respiratory distress.     Breath sounds: Normal breath sounds. No stridor. No wheezing, rhonchi or rales.     Comments: Lungs are clear to auscultation bilaterally. Abdominal:     General: Abdomen is flat.     Palpations: Abdomen is soft.     Tenderness: There is no abdominal tenderness.     Comments: Abdomen is soft and nontender in all 4 quadrants.  Musculoskeletal:        General: Tenderness present. Normal range of motion.     Cervical back: Normal range of motion and neck supple. No tenderness.     Right lower leg: No edema.     Left lower leg: No edema.     Comments: Mild tenderness noted along the bilateral hamstrings.  No palpable cords.  No calf tenderness.  No pedal edema appreciated.  Palpable DP pulses noted bilaterally.  Distal sensation intact.  Skin:    General: Skin is warm and dry.  Neurological:     General: No focal deficit present.     Mental Status: She is alert and oriented to person, place, and time.  Psychiatric:        Mood and Affect: Mood normal.        Behavior: Behavior normal.     ED Results / Procedures / Treatments   Labs (all labs ordered are listed, but only abnormal results are displayed) Labs Reviewed  BASIC METABOLIC PANEL - Abnormal; Notable for the following components:      Result Value   Glucose, Bld 106 (*)    All other components within normal limits  CBC    EKG None  Radiology No results found.  Procedures Procedures (including critical care time)  Medications Ordered in ED Medications - No data to display  ED Course  I have reviewed the triage vital signs and  the nursing notes.  Pertinent labs & imaging results that were available during my care of the patient were reviewed by me and considered in my medical decision making (see chart for details).    MDM Rules/Calculators/A&P                          Pt is a 59 y.o. female that presents with a history, physical exam, and ED Clinical Course as noted above.   Patient presents today with what appears to  be muscle spasms in the bilateral hamstrings.  Patient does have tenderness in both hamstrings.  Otherwise, her physical exam is reassuring.  She is not tachycardic or tachypneic.  Not hypoxic.  Basic labs were obtained in triage and are reassuring.  No electrolyte abnormalities.  Very mild hyperglycemia of 106.  Patient notes that she has a few cyclobenzaprine from a prior prescription in her home.  We will give her an additional prescription for a few additional cyclobenzaprine.  She was given a dose in the emergency department as well.  We discussed safety regarding this medication.  Patient actually has a follow-up appointment with her primary care provider tomorrow afternoon.  Urged her to make sure that she keeps his appointment.  She understands she can return to the ED with any new or worsening symptoms.  Her questions were answered and she was amicable at the time of discharge.   An After Visit Summary was printed and given to the patient.  Patient discharged to home/self care.  Condition at discharge: Stable  Note: Portions of this report may have been transcribed using voice recognition software. Every effort was made to ensure accuracy; however, inadvertent computerized transcription errors may be present.   Final Clinical Impression(s) / ED Diagnoses Final diagnoses:  Muscle spasm   Rx / DC Orders ED Discharge Orders         Ordered    cyclobenzaprine (FLEXERIL) 10 MG tablet  Daily at bedtime        12/21/19 1000           Rayna Sexton, PA-C 12/21/19 1002    Quintella Reichert, MD 12/21/19 1124

## 2019-12-21 NOTE — ED Triage Notes (Signed)
Pt comes via [facility] from home for bilateral leg pain and spasms that has been going on all evening.

## 2019-12-21 NOTE — ED Notes (Signed)
Pt seen in FT rooms.  See PA assessment.

## 2019-12-21 NOTE — Discharge Instructions (Signed)
I am prescribing you a strong muscle relaxer called flexeril. Please only take this medication once in the evening with dinner. This medication can make you quite drowsy. Do not mix it with alcohol. Do not drive a vehicle after taking it.   Please return to the emergency department with new or worsening symptoms.  Please make sure you keep your appointment with your primary care provider tomorrow.  It was a pleasure to meet you.

## 2019-12-26 IMAGING — CR DG THORACIC SPINE 3V
3 series · 3 of 3 positions shown · non-contrast
Comparison: Chest x-ray of June 10, 2017

CLINICAL DATA: Patient sustained a fall earlier this month and has
persistent lower thoracic spine discomfort.

EXAM:
THORACIC SPINE - 3 VIEWS

[t-spine ap]
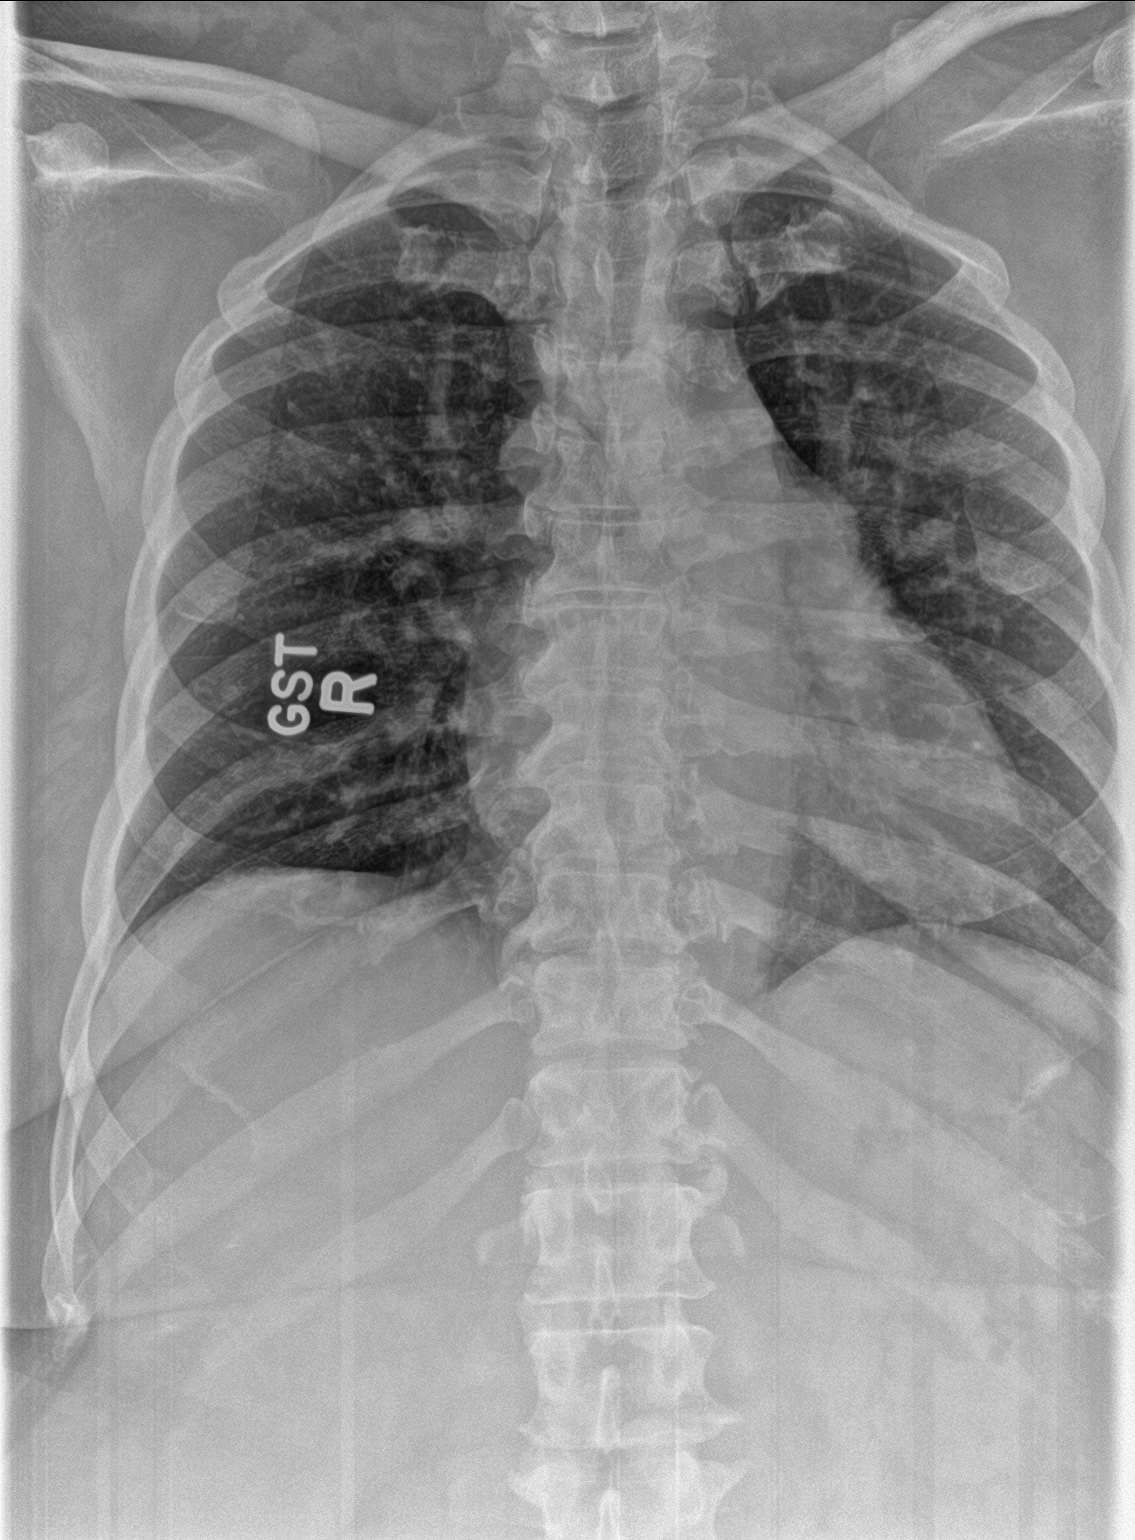

[t-spine lat]
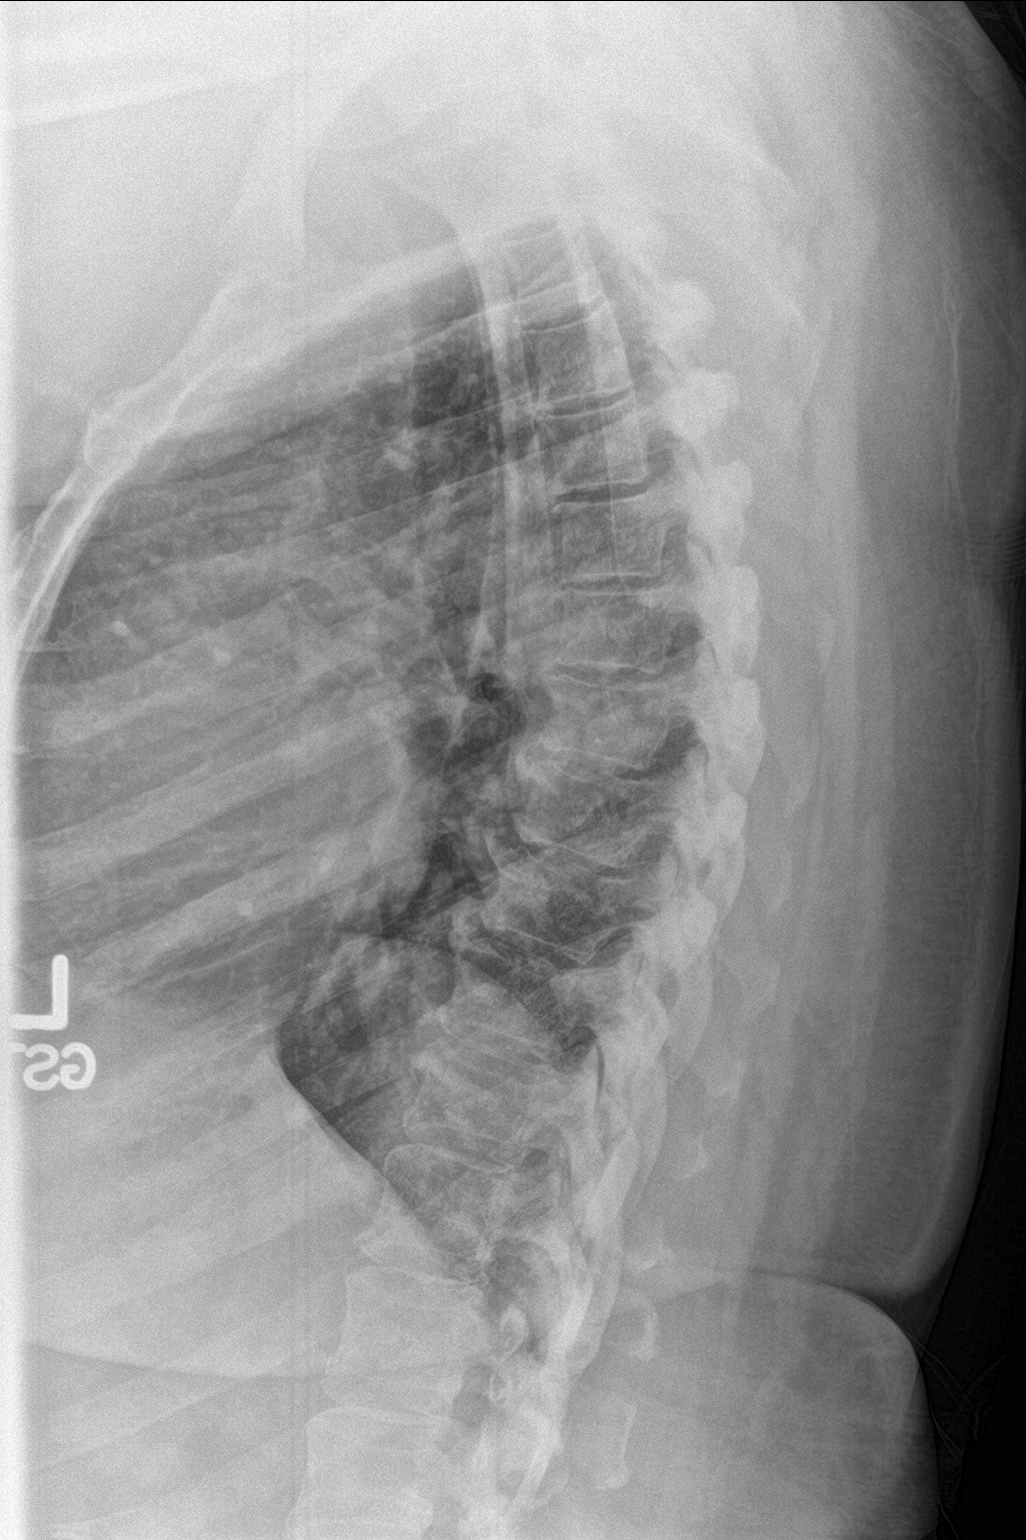

[t-spine swimmers]
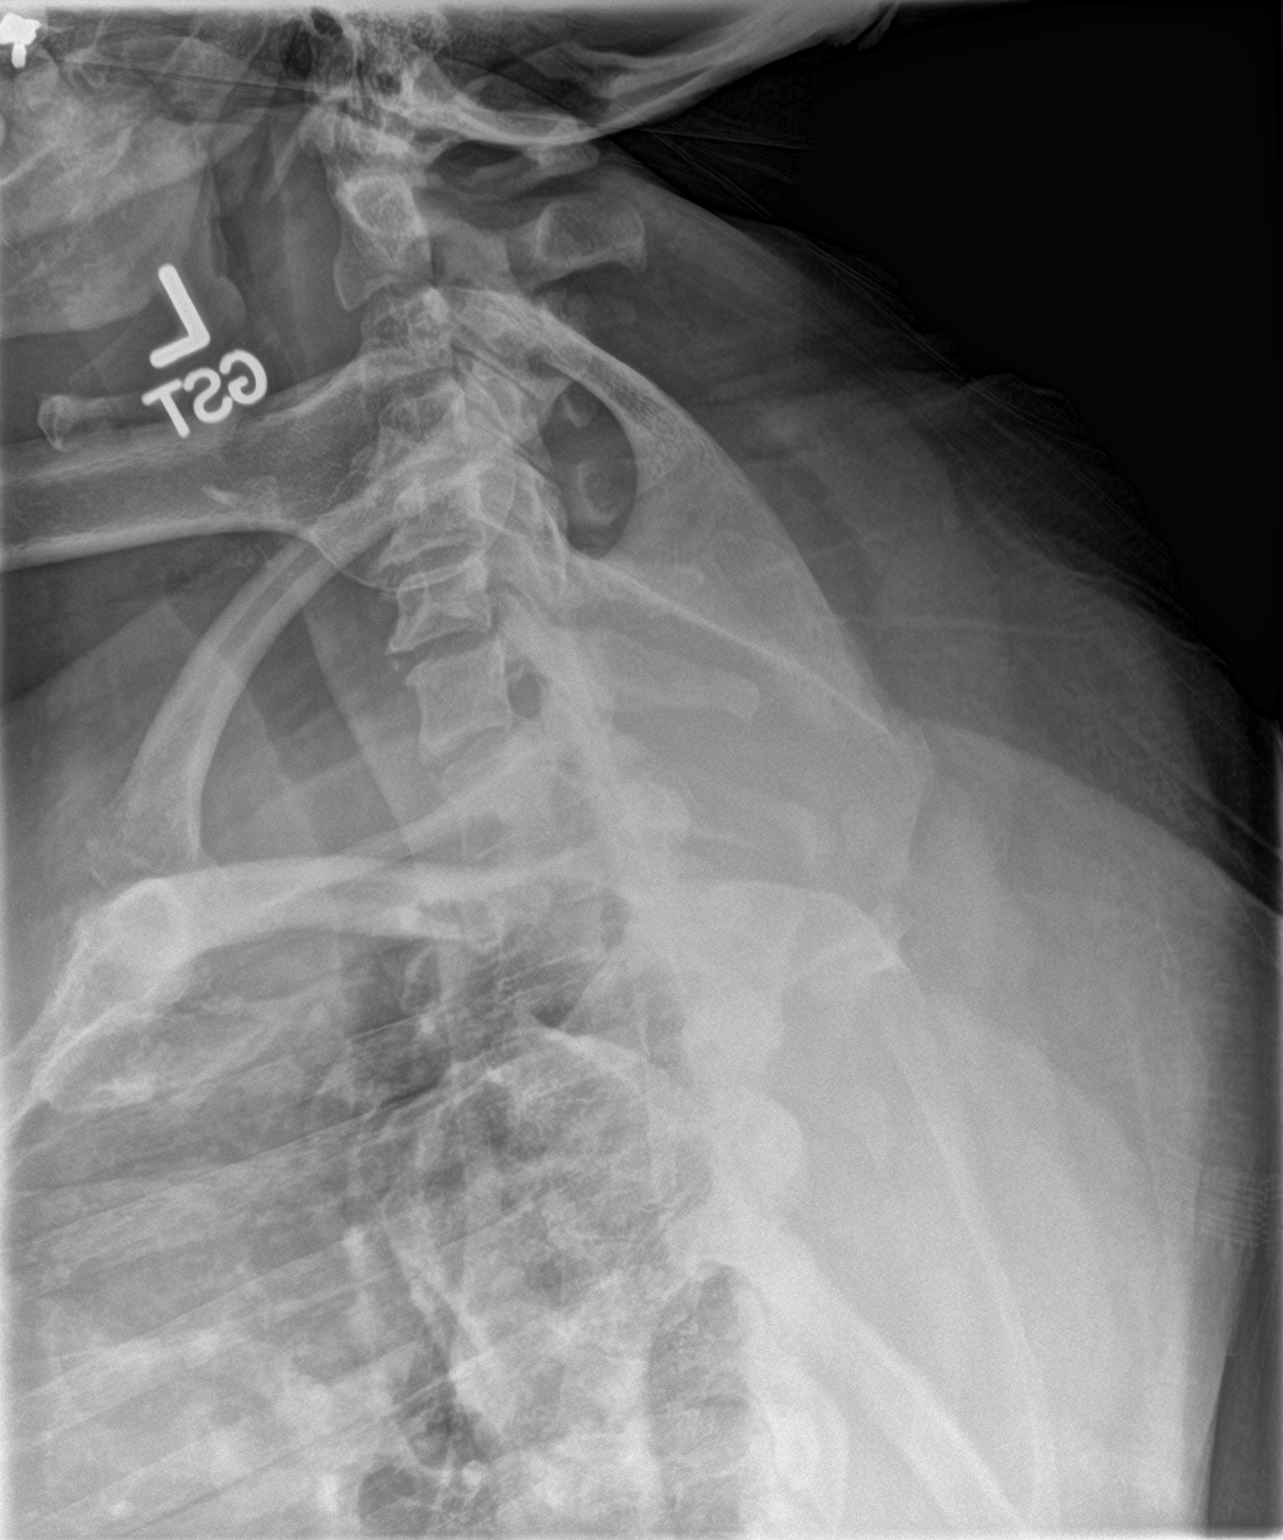

[3 of 3 positions shown; findings below may reference images not displayed]

FINDINGS: The thoracic vertebral bodies are preserved in height. The pedicles
appear to be appropriately positioned. There are multilevel endplate
osteophytes anteriorly and to the right which are stable. There are
no abnormal paravertebral soft tissue densities. The disc space
heights are well maintained.
IMPRESSION: There is no acute fracture or dislocation of the cervical spine.
There is moderate multilevel degenerative disc disease manifested by
endplate osteophytes.

## 2020-01-05 ENCOUNTER — Encounter (INDEPENDENT_AMBULATORY_CARE_PROVIDER_SITE_OTHER): Payer: Self-pay | Admitting: Family Medicine

## 2020-01-05 ENCOUNTER — Ambulatory Visit (INDEPENDENT_AMBULATORY_CARE_PROVIDER_SITE_OTHER): Payer: Medicare Other | Admitting: Family Medicine

## 2020-01-05 ENCOUNTER — Other Ambulatory Visit: Payer: Self-pay

## 2020-01-05 VITALS — BP 101/67 | HR 98 | Temp 98.7°F | Ht 59.0 in | Wt 216.0 lb

## 2020-01-05 DIAGNOSIS — Z9189 Other specified personal risk factors, not elsewhere classified: Secondary | ICD-10-CM

## 2020-01-05 DIAGNOSIS — Z6841 Body Mass Index (BMI) 40.0 and over, adult: Secondary | ICD-10-CM

## 2020-01-05 DIAGNOSIS — E1169 Type 2 diabetes mellitus with other specified complication: Secondary | ICD-10-CM

## 2020-01-05 MED ORDER — JANUMET XR 50-500 MG PO TB24: 1.0000 | ORAL_TABLET | Freq: Every day | ORAL | 0 refills | Status: DC

## 2020-01-05 NOTE — Progress Notes (Signed)
Chief Complaint:   OBESITY [name] is here to discuss her progress with her obesity treatment plan along with follow-up of her obesity related diagnoses. [name] is on the Category 2 Plan and states she is following her eating plan approximately 75% of the time. [name] states she is walking for 10-20 minutes 4 times per week.  Today's visit was #: 36 Starting weight: 225 lbs Starting date: 11/20/2017 Today's weight: 216 lbs Today's date: 01/05/2020 Total lbs lost to date: 9 Total lbs lost since last in-office visit: 1  Interim History: [name] continues to do well with weight loss. She has a lot of stress caring for her mother. She is walking for exercise most days, and her hunger is mostly controlled.  Subjective:   1. Type 2 diabetes mellitus with other specified complication, without long-term current use of insulin (HCC) [name]'s last A1c was 7.5 on her medications, and she notes some itching in her hands and feet, and feels this may be due to her Ozempic. She notes it is mildly annoying but not enough to stop.  2. At risk for depression [name] is at elevated risk of depression due to family stress and caregiver fatigue.  Assessment/Plan:   1. Type 2 diabetes mellitus with other specified complication, without long-term current use of insulin (HCC) Good blood sugar control is important to decrease the likelihood of diabetic complications such as nephropathy, neuropathy, limb loss, blindness, coronary artery disease, and death. Intensive lifestyle modification including diet, exercise and weight loss are the first line of treatment for diabetes. We will refill Janumet for 1 month, and [name] will continue Ozempic as is and will continue to monitor.  - SitaGLIPtin-MetFORMIN HCl (JANUMET XR) 50-500 MG TB24; Take 1 tablet by mouth daily.  Dispense: 30 tablet; Refill: 0  2. At risk for depression [name] was given approximately 15 minutes of depression risk counseling today. She has  risk factors for depression including stress. We discussed the importance of a healthy work life balance, a healthy relationship with food and a good support system.  Repetitive spaced learning was employed today to elicit superior memory formation and behavioral change.  3. Class 3 severe obesity with serious comorbidity and body mass index (BMI) of 40.0 to 44.9 in adult, unspecified obesity type (HCC) [name] is currently in the action stage of change. As such, her goal is to continue with weight loss efforts. She has agreed to the Category 2 Plan.   Exercise goals: As is.  Behavioral modification strategies: increasing lean protein intake.  [name] has agreed to follow-up with our clinic in 4 weeks. She was informed of the importance of frequent follow-up visits to maximize her success with intensive lifestyle modifications for her multiple health conditions.   Objective:   Blood pressure 101/67, pulse 98, temperature 98.7 F (37.1 C), height 4\' 11"  (1.499 m), weight 216 lb (98 kg), SpO2 98 %. Body mass index is 43.63 kg/m.  General: Cooperative, alert, well developed, in no acute distress. HEENT: Conjunctivae and lids unremarkable. Cardiovascular: Regular rhythm.  Lungs: Normal work of breathing. Neurologic: No focal deficits.   Lab Results  Component Value Date   CREATININE 0.98 12/21/2019   BUN 10 12/21/2019   NA 144 12/21/2019   K 3.5 12/21/2019   CL 101 12/21/2019   CO2 31 12/21/2019   Lab Results  Component Value Date   ALT 33 08/14/2019   AST 17 08/14/2019   ALKPHOS 68 08/14/2019   BILITOT 0.5 08/14/2019  Lab Results  Component Value Date   HGBA1C 7.5 (H) 10/07/2019   HGBA1C 6.3 (H) 12/26/2018   HGBA1C 6.6 (H) 05/22/2018   HGBA1C 6.8 (H) 03/04/2018   HGBA1C 8.8 (H) 11/20/2017   Lab Results  Component Value Date   INSULIN 28.1 (H) 12/26/2018   INSULIN 20.2 03/04/2018   INSULIN 53.9 (H) 11/20/2017   Lab Results  Component Value Date   TSH 0.458  03/05/2019   Lab Results  Component Value Date   CHOL 119 12/26/2018   HDL 58 12/26/2018   LDLCALC 41 12/26/2018   TRIG 108 12/26/2018   CHOLHDL 4.2 04/23/2011   CHOLHDL 4.3 04/23/2011   Lab Results  Component Value Date   WBC 7.1 12/21/2019   HGB 12.3 12/21/2019   HCT 39.0 12/21/2019   MCV 87.8 12/21/2019   PLT 241 12/21/2019   No results found for: IRON, TIBC, FERRITIN  Attestation Statements:   Reviewed by clinician on day of visit: allergies, medications, problem list, medical history, surgical history, family history, social history, and previous encounter notes.   I, Trixie Dredge, am acting as transcriptionist for Dennard Nip, MD.  I have reviewed the above documentation for accuracy and completeness, and I agree with the above. -  Dennard Nip, MD

## 2020-01-12 ENCOUNTER — Other Ambulatory Visit: Payer: Self-pay

## 2020-01-12 ENCOUNTER — Encounter: Payer: Self-pay | Admitting: Emergency Medicine

## 2020-01-12 ENCOUNTER — Ambulatory Visit
Admission: EM | Admit: 2020-01-12 | Discharge: 2020-01-12 | Disposition: A | Discharge status: Home / Self Care | Payer: Medicare Other | Attending: Urgent Care | Admitting: Urgent Care

## 2020-01-12 DIAGNOSIS — Z20822 Contact with and (suspected) exposure to covid-19: Secondary | ICD-10-CM

## 2020-01-12 DIAGNOSIS — B9789 Other viral agents as the cause of diseases classified elsewhere: Secondary | ICD-10-CM

## 2020-01-12 DIAGNOSIS — R0981 Nasal congestion: Secondary | ICD-10-CM

## 2020-01-12 DIAGNOSIS — Z1152 Encounter for screening for COVID-19: Secondary | ICD-10-CM

## 2020-01-12 DIAGNOSIS — R519 Headache, unspecified: Secondary | ICD-10-CM

## 2020-01-12 MED ORDER — BENZONATATE 100 MG PO CAPS: 100.0000 mg | ORAL_CAPSULE | Freq: Three times a day (TID) | ORAL | 0 refills | Status: DC | PRN

## 2020-01-12 MED ORDER — PSEUDOEPHEDRINE HCL 30 MG PO TABS
30.0000 mg | ORAL_TABLET | Freq: Three times a day (TID) | ORAL | 0 refills | Status: DC | PRN
Start: 2020-01-12 — End: 2021-03-08

## 2020-01-12 MED ORDER — PROMETHAZINE-DM 6.25-15 MG/5ML PO SYRP
5.0000 mL | ORAL_SOLUTION | Freq: Every evening | ORAL | 0 refills | Status: DC | PRN
Start: 2020-01-12 — End: 2021-03-08

## 2020-01-12 NOTE — Discharge Instructions (Addendum)
We will notify you of your COVID-19 test results as they arrive and may take between 24 to 48 hours.  I encourage you to sign up for MyChart if you have not already done so as this can be the easiest way for us to communicate results to you online or through a phone app.  In the meantime, if you develop worsening symptoms including fever, chest pain, shortness of breath despite our current treatment plan then please report to the emergency room as this may be a sign of worsening status from possible COVID-19 infection.  Otherwise, we will manage this as a viral syndrome. For sore throat or cough try using a honey-based tea. Use 3 teaspoons of honey with juice squeezed from half lemon. Place shaved pieces of ginger into 1/2-1 cup of water and warm over stove top. Then mix the ingredients and repeat every 4 hours as needed. Please take Tylenol 500mg-650mg every 6 hours for aches and pains, fevers. Hydrate very well with at least 2 liters of water. Eat light meals such as soups to replenish electrolytes and soft fruits, veggies. Start an antihistamine like Zyrtec, Allegra or Claritin for postnasal drainage, sinus congestion.  You can take this together with pseudoephedrine (Sudafed) at a dose of 30 mg 2-3 times a day as needed for the same kind of congestion.    

## 2020-01-12 NOTE — ED Provider Notes (Signed)
[place]   MRN: [id number] DOB: 05-09-1960  Subjective:   [name] is a 59 y.o. female presenting for several day history of sinus congestion, sinus headache.  Patient had exposure last week to COVID-19.  She is vaccinated.  Denies fever, cough, chest pain, shortness of breath.  No current facility-administered medications for this encounter.  Current Outpatient Medications:  .  albuterol (PROVENTIL HFA;VENTOLIN HFA) 108 (90 Base) MCG/ACT inhaler, Inhale 2 puffs into the lungs every 6 (six) hours as needed for wheezing or shortness of breath., Disp: , Rfl:  .  aspirin 81 MG chewable tablet, Chew 81 mg by mouth daily., Disp: , Rfl:  .  atorvastatin (LIPITOR) 40 MG tablet, Take 40 mg by mouth at bedtime. , Disp: , Rfl:  .  carvedilol (COREG) 12.5 MG tablet, Take 12.5 mg by mouth at bedtime., Disp: , Rfl:  .  Cholecalciferol (VITAMIN D) 125 MCG (5000 UT) CAPS, Take 2 capsules by mouth daily., Disp: 60 capsule, Rfl: 0 .  cyclobenzaprine (FLEXERIL) 10 MG tablet, Take 1 tablet (10 mg total) by mouth at bedtime., Disp: 5 tablet, Rfl: 0 .  diclofenac Sodium (VOLTAREN) 1 % GEL, Apply 2 g topically 4 (four) times daily., Disp: , Rfl:  .  escitalopram (LEXAPRO) 20 MG tablet, Take 20 mg by mouth daily., Disp: , Rfl:  .  furosemide (LASIX) 40 MG tablet, Take 40 mg by mouth daily., Disp: , Rfl:  .  isosorbide dinitrate (ISORDIL) 20 MG tablet, Take 20 mg by mouth 2 (two) times daily. , Disp: , Rfl:  .  lisinopril (PRINIVIL,ZESTRIL) 20 MG tablet, Take 20 mg by mouth daily. , Disp: , Rfl:  .  loperamide (IMODIUM A-D) 2 MG tablet, Take 4 mg by mouth 4 (four) times daily as needed for diarrhea or loose stools. , Disp: , Rfl:  .  MYRBETRIQ 25 MG TB24 tablet, Take 1 tablet (25 mg total) by mouth daily., Disp: 30 tablet, Rfl: 0 .  omeprazole (PRILOSEC) 20 MG capsule, Take 20 mg by mouth 2 (two) times daily., Disp: , Rfl:  .  potassium chloride (KLOR-CON M10) 10 MEQ tablet, Take 1  tablet (10 mEq total) by mouth 2 (two) times daily., Disp: 60 tablet, Rfl: 1 .  Semaglutide,0.25 or 0.5MG /DOS, (OZEMPIC, 0.25 OR 0.5 MG/DOSE,) 2 MG/1.5ML SOPN, Inject 0.1875 mLs (0.25 mg total) into the skin once a week., Disp: 4 pen, Rfl: 0 .  SitaGLIPtin-MetFORMIN HCl (JANUMET XR) 50-500 MG TB24, Take 1 tablet by mouth daily., Disp: 30 tablet, Rfl: 0 .  traMADol (ULTRAM) 50 MG tablet, Take 1 tablet (50 mg total) by mouth every 6 (six) hours as needed., Disp: 10 tablet, Rfl: 0 .  traZODone (DESYREL) 50 MG tablet, Take 1 tablet by mouth at bedtime., Disp: , Rfl:    Allergies  Allergen Reactions  . Penicillins Swelling and Other (See Comments)    Has patient had a PCN reaction causing immediate rash, facial/tongue/throat swelling, SOB or lightheadedness with hypotension: YES Has patient had a PCN reaction causing severe rash involving mucus membranes or skin necrosis: NO Has patient had a PCN reaction that required hospitalization NO Has patient had a PCN reaction occurring within the last 10 years: NO If all of the above answers are "NO", then may proceed with Cephalosporin use.  [name] Hydrocodone-Acetaminophen Itching    confusion  . Latex Rash  . Sulfa Antibiotics Diarrhea, Itching and Rash    Past Medical History:  Diagnosis Date  . Anginal pain (  [place])    a. NL cath in 2008;  b. Myoview 03/2011: dec uptake along mid anterior wall on stress imaging -> ? attenuation vs. ischemia, EF 65%;  c. Echo 04/2011: EF 55-60%, no RWMA, Gr 2 dd  . Anxiety   . Arthritis   . Asthma   . Back pain   . Bone cancer ([place])   . Cancer ([place])   . Chest pain   . CHF (congestive heart failure) ([place])   . CHF (congestive heart failure) ([place])   . Depression   . Diabetes mellitus   . Drug use   . Dyspnea    with exertion  . Dyspnea   . Frequent urination   . GERD (gastroesophageal reflux disease)   . Glaucoma   . HLD (hyperlipidemia)   . Hypertension   . IBS (irritable bowel syndrome)   . Joint pain   .  Lactose intolerance   . Leg edema   . 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 Obesity   . Palpitations   . Pneumonia 05/2016   double  . Pulmonary edema   . Pulmonary embolism ([place])    a. 2008 -> coumadin x 6 mos.  . Rheumatoid arthritis ([place])   . Sleep apnea    on CPAP 02/2018  . TIA (transient ischemic attack)   . Urinary urgency      Past Surgical History:  Procedure Laterality Date  . ABDOMINAL HYSTERECTOMY  2005  . APPENDECTOMY    . CARDIAC CATHETERIZATION     Normal  . CARDIAC CATHETERIZATION N/A 09/30/2014   Procedure: Left Heart Cath and Coronary Angiography;  Surgeon: Sherren Mocha, MD;  Location: [facility];  Service: Cardiovascular;  Laterality: N/A;  . LAPAROSCOPIC APPENDECTOMY N/A 06/03/2012   Procedure: APPENDECTOMY LAPAROSCOPIC;  Surgeon: Stark Klein, MD;  Location: [facility];  Service: General;  Laterality: N/A;  . Left knee surgery  2008  . LEG SURGERY    . TONSILLECTOMY    . TOTAL HIP ARTHROPLASTY Left 06/11/2018   Procedure: LEFT TOTAL HIP ARTHROPLASTY ANTERIOR APPROACH;  Surgeon: Meredith Pel, MD;  Location: [facility];  Service: Orthopedics;  Laterality: Left;  . TOTAL KNEE ARTHROPLASTY Left 08/22/2016   Procedure: TOTAL KNEE ARTHROPLASTY;  Surgeon: Meredith Pel, MD;  Location: [facility];  Service: Orthopedics;  Laterality: Left;  . TUBAL LIGATION  1989    Family History  Problem Relation Age of Onset  . Emphysema Mother   . Arthritis Mother   . Heart failure Mother        alive @ 59  . Stroke Mother   . Diabetes Mother   . Hypertension Mother   . Hyperlipidemia Mother   . Depression Mother   . Anxiety disorder Mother   . Asthma Brother   . Heart disease Father        died @ 33's.  . Stroke Father   . Diabetes Father   . Hyperlipidemia Father   . Hypertension Father   . Bipolar disorder Father   . Sleep apnea Father   . Alcoholism Father   . Drug abuse Father   . Diabetes Sister   . Heart disease Paternal  Grandfather   . Colon cancer Maternal Grandfather   . Breast cancer Maternal Aunt     Social History   Tobacco Use  . Smoking status: Former Smoker    Packs/day: 0.50    Years: 15.00    Pack  years: 7.50    Types: Cigarettes    Quit date: 04/24/1985    Years since quitting: 34.7  . Smokeless tobacco: Never Used  Substance Use Topics  . Alcohol use: No    Alcohol/week: 0.0 standard drinks  . Drug use: Not Currently    Types: Marijuana    ROS   Objective:   Vitals: BP (!) 146/85 (BP Location: Left Arm)   Pulse 80   Temp 98 F (36.7 C) (Oral)   Resp 18   SpO2 94%   Physical Exam Constitutional:      General: She is not in acute distress.    Appearance: Normal appearance. She is well-developed. She is not ill-appearing, toxic-appearing or diaphoretic.  HENT:     Head: Normocephalic and atraumatic.     Nose: Nose normal.     Mouth/Throat:     Mouth: Mucous membranes are moist.  Eyes:     Extraocular Movements: Extraocular movements intact.     Pupils: Pupils are equal, round, and reactive to light.  Cardiovascular:     Rate and Rhythm: Normal rate and regular rhythm.     Pulses: Normal pulses.     Heart sounds: Normal heart sounds. No murmur heard.  No friction rub. No gallop.   Pulmonary:     Effort: Pulmonary effort is normal. No respiratory distress.     Breath sounds: Normal breath sounds. No stridor. No wheezing, rhonchi or rales.  Skin:    General: Skin is warm and dry.     Findings: No rash.  Neurological:     Mental Status: She is alert and oriented to person, place, and time.  Psychiatric:        Mood and Affect: Mood normal.        Behavior: Behavior normal.        Thought Content: Thought content normal.       Assessment and Plan :   PDMP not reviewed this encounter.  1. Encounter for screening for COVID-19   2. Exposure to COVID-19 virus   3. Sinus headache   4. Nasal congestion   5. Viral respiratory illness     Will manage for  viral illness such as viral URI, viral syndrome, viral rhinitis, COVID-19. Counseled patient on nature of COVID-19 including modes of transmission, diagnostic testing, management and supportive care.  Offered scripts for symptomatic relief. COVID 19 testing is pending. Counseled patient on potential for adverse effects with medications prescribed/recommended today, ER and return-to-clinic precautions discussed, patient verbalized understanding.     Jaynee Eagles, [place] 01/12/20 [phone]

## 2020-01-12 NOTE — ED Triage Notes (Signed)
Pt here for covid testing after exposure last week and having HA and congestion

## 2020-01-16 LAB — NOVEL CORONAVIRUS, NAA: SARS-CoV-2, NAA: NOT DETECTED

## 2020-01-20 ENCOUNTER — Other Ambulatory Visit: Payer: Self-pay

## 2020-01-20 ENCOUNTER — Emergency Department (HOSPITAL_COMMUNITY)
Admission: EM | Admit: 2020-01-20 | Discharge: 2020-01-21 | Disposition: A | Discharge status: Home / Self Care | Payer: Medicare Other | Attending: Emergency Medicine | Admitting: Emergency Medicine

## 2020-01-20 ENCOUNTER — Emergency Department (HOSPITAL_COMMUNITY): Payer: Medicare Other

## 2020-01-20 ENCOUNTER — Encounter (HOSPITAL_COMMUNITY): Payer: Self-pay | Admitting: Emergency Medicine

## 2020-01-20 DIAGNOSIS — Z91040 Latex allergy status: Secondary | ICD-10-CM | POA: Insufficient documentation

## 2020-01-20 DIAGNOSIS — Z7951 Long term (current) use of inhaled steroids: Secondary | ICD-10-CM | POA: Diagnosis not present

## 2020-01-20 DIAGNOSIS — Z79899 Other long term (current) drug therapy: Secondary | ICD-10-CM | POA: Insufficient documentation

## 2020-01-20 DIAGNOSIS — Z96652 Presence of left artificial knee joint: Secondary | ICD-10-CM | POA: Diagnosis not present

## 2020-01-20 DIAGNOSIS — I110 Hypertensive heart disease with heart failure: Secondary | ICD-10-CM | POA: Diagnosis not present

## 2020-01-20 DIAGNOSIS — Z96642 Presence of left artificial hip joint: Secondary | ICD-10-CM | POA: Diagnosis not present

## 2020-01-20 DIAGNOSIS — J45909 Unspecified asthma, uncomplicated: Secondary | ICD-10-CM | POA: Diagnosis not present

## 2020-01-20 DIAGNOSIS — Z7984 Long term (current) use of oral hypoglycemic drugs: Secondary | ICD-10-CM | POA: Diagnosis not present

## 2020-01-20 DIAGNOSIS — E119 Type 2 diabetes mellitus without complications: Secondary | ICD-10-CM | POA: Insufficient documentation

## 2020-01-20 DIAGNOSIS — R079 Chest pain, unspecified: Secondary | ICD-10-CM | POA: Diagnosis present

## 2020-01-20 DIAGNOSIS — Z7982 Long term (current) use of aspirin: Secondary | ICD-10-CM | POA: Insufficient documentation

## 2020-01-20 DIAGNOSIS — U071 COVID-19: Secondary | ICD-10-CM | POA: Diagnosis not present

## 2020-01-20 DIAGNOSIS — Z87891 Personal history of nicotine dependence: Secondary | ICD-10-CM | POA: Insufficient documentation

## 2020-01-20 DIAGNOSIS — I509 Heart failure, unspecified: Secondary | ICD-10-CM | POA: Insufficient documentation

## 2020-01-20 LAB — BASIC METABOLIC PANEL
Anion gap: 10 (ref 5–15)
BUN: 16 mg/dL (ref 6–20)
CO2: 28 mmol/L (ref 22–32)
Calcium: 9 mg/dL (ref 8.9–10.3)
Chloride: 102 mmol/L (ref 98–111)
Creatinine, Ser: 1.01 mg/dL — ABNORMAL HIGH (ref 0.44–1.00)
GFR calc Af Amer: >60 mL/min (ref >60)
GFR calc non Af Amer: >60 mL/min (ref >60)
Glucose, Bld: 156 mg/dL — ABNORMAL HIGH (ref 70–99)
Potassium: 3.6 mmol/L (ref 3.5–5.1)
Sodium: 140 mmol/L (ref 135–145)

## 2020-01-20 LAB — CBC
HCT: 39.9 % (ref 36.0–46.0)
Hemoglobin: 12.8 g/dL (ref 12.0–15.0)
MCH: 28.4 pg (ref 26.0–34.0)
MCHC: 32.1 g/dL (ref 30.0–36.0)
MCV: 88.5 fL (ref 80.0–100.0)
Platelets: 178 10*3/uL (ref 150–400)
RBC: 4.51 MIL/uL (ref 3.87–5.11)
RDW: 13.5 % (ref 11.5–15.5)
WBC: 5.1 10*3/uL (ref 4.0–10.5)
nRBC: 0 % (ref 0.0–0.2)

## 2020-01-20 LAB — BRAIN NATRIURETIC PEPTIDE: B Natriuretic Peptide: 19.6 pg/mL (ref 0.0–100.0)

## 2020-01-20 LAB — TROPONIN I (HIGH SENSITIVITY): Troponin I (High Sensitivity): 9 ng/L (ref <18)

## 2020-01-20 MED ORDER — ACETAMINOPHEN 500 MG PO TABS
1000.0000 mg | ORAL_TABLET | Freq: Once | ORAL | Status: AC
  Administered 2020-01-20: 1000 mg via ORAL
  Filled 2020-01-20: qty 2

## 2020-01-20 NOTE — ED Notes (Signed)
Lab to add on BNP.  °

## 2020-01-20 NOTE — ED Triage Notes (Signed)
Patient is complaining of generalized body aches. Patient also is having mid chest pain. Patient states that she has been sick for over a week.

## 2020-01-20 NOTE — ED Provider Notes (Signed)
[facility] DEPT Provider Note   CSN: [id number] Arrival date & time: 01/20/20  2033     History Chief Complaint  Patient presents with  . Chest Pain  . Generalized Body Aches    [name] is a 59 y.o. female.  Patient with PMH of CHF, DM, HLD, HTN presents to the ED with a chief complaint of generalized body aches and chest pain.  She states that the pain seems to be associated to her coughing.  She has been sick for a little over a week.  She denies any fever.  She was tested for COVID a week ago and it was negative.  She has been taking cough medicine without relief.  Her symptoms are worsened with movement/activity.  The history is provided by the patient. No language interpreter was used.       Past Medical History:  Diagnosis Date  . Anginal pain ([place])    a. NL cath in 2008;  b. Myoview 03/2011: dec uptake along mid anterior wall on stress imaging -> ? attenuation vs. ischemia, EF 65%;  c. Echo 04/2011: EF 55-60%, no RWMA, Gr 2 dd  . Anxiety   . Arthritis   . Asthma   . Back pain   . Bone cancer ([place])   . Cancer ([place])   . Chest pain   . CHF (congestive heart failure) ([place])   . CHF (congestive heart failure) ([place])   . Depression   . Diabetes mellitus   . Drug use   . Dyspnea    with exertion  . Dyspnea   . Frequent urination   . GERD (gastroesophageal reflux disease)   . Glaucoma   . HLD (hyperlipidemia)   . Hypertension   . IBS (irritable bowel syndrome)   . Joint pain   . Lactose intolerance   . Leg edema   . 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 Obesity   . Palpitations   . Pneumonia 05/2016   double  . Pulmonary edema   . Pulmonary embolism ([place])    a. 2008 -> coumadin x 6 mos.  . Rheumatoid arthritis ([place])   . Sleep apnea    on CPAP 02/2018  . TIA (transient ischemic attack)   . Urinary urgency     Patient Active Problem List   Diagnosis Date Noted  . Unilateral primary  osteoarthritis, left hip   . Hip arthritis 06/11/2018  . Class 3 severe obesity with serious comorbidity and body mass index (BMI) of 40.0 to 44.9 in adult ([place]) 05/29/2018  . Other fatigue 11/20/2017  . Shortness of breath on exertion 11/20/2017  . Diabetes mellitus ([place]) 11/20/2017  . Vitamin D deficiency 11/20/2017  . Depression 11/20/2017  . Other hyperlipidemia 11/20/2017  . 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 Presence of left artificial knee joint 09/20/2016  . Arthritis of knee 08/22/2016  . Cervical radiculopathy 07/26/2016  . Cervical disc disorder with radiculopathy 07/26/2016  . Right arm pain 06/29/2016  . Cervicalgia 06/29/2016  . Primary osteoarthritis of left knee 06/29/2016  . Hypersomnia with sleep apnea 11/18/2015  . Lethargy 11/18/2015  . Chest pain 09/30/2014  . Abnormal cardiac function test 09/29/2014  . Chest pain with moderate risk for cardiac etiology 09/28/2014  . HTN (hypertension) 09/28/2014  . Hypokalemia   . Paresthesia 06/18/2014  . Numbness and tingling of left arm and leg   . Unstable angina ([place]) 05/24/2014  .  OSA on CPAP 05/24/2014  . S/P laparoscopic appendectomy 06/04/2012  . Diastolic CHF ([place]) 16/01/9603  . Mediastinal mass 01/09/2012  . SOB (shortness of breath) 06/20/2011  . Mediastinal abnormality 06/20/2011  . Dyslipidemia 12/23/2009  . GLAUCOMA 12/23/2009  . ARTHRITIS 12/23/2009  . 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 Class 2 severe obesity with serious comorbidity and body mass index (BMI) of 38.0 to 38.9 in adult ([place]) 09/13/2006  . ANXIETY STATE NOS 09/13/2006  . DISORDER, DEPRESSIVE NEC 09/13/2006  . CARPAL TUNNEL SYNDROME, MILD 09/13/2006  . Unspecified essential hypertension 09/13/2006  . IBS 09/13/2006  . DEGENERATION, LUMBAR/LUMBOSACRAL DISC 09/13/2006  . SYMPTOM, SWELLING/MASS/LUMP IN CHEST 09/13/2006  . PULMONARY EMBOLISM, HX OF 09/13/2006    Past Surgical History:  Procedure Laterality Date  . ABDOMINAL  HYSTERECTOMY  2005  . APPENDECTOMY    . CARDIAC CATHETERIZATION     Normal  . CARDIAC CATHETERIZATION N/A 09/30/2014   Procedure: Left Heart Cath and Coronary Angiography;  Surgeon: Sherren Mocha, MD;  Location: [facility];  Service: Cardiovascular;  Laterality: N/A;  . LAPAROSCOPIC APPENDECTOMY N/A 06/03/2012   Procedure: APPENDECTOMY LAPAROSCOPIC;  Surgeon: Stark Klein, MD;  Location: [facility];  Service: General;  Laterality: N/A;  . Left knee surgery  2008  . LEG SURGERY    . TONSILLECTOMY    . TOTAL HIP ARTHROPLASTY Left 06/11/2018   Procedure: LEFT TOTAL HIP ARTHROPLASTY ANTERIOR APPROACH;  Surgeon: Meredith Pel, MD;  Location: [facility];  Service: Orthopedics;  Laterality: Left;  . TOTAL KNEE ARTHROPLASTY Left 08/22/2016   Procedure: TOTAL KNEE ARTHROPLASTY;  Surgeon: Meredith Pel, MD;  Location: [facility];  Service: Orthopedics;  Laterality: Left;  . TUBAL LIGATION  1989     OB History    Gravida  3   Para      Term      Preterm      AB      Living        SAB      TAB      Ectopic      Multiple      Live Births              Family History  Problem Relation Age of Onset  . Emphysema Mother   . Arthritis Mother   . Heart failure Mother        alive @ 34  . Stroke Mother   . Diabetes Mother   . Hypertension Mother   . Hyperlipidemia Mother   . Depression Mother   . Anxiety disorder Mother   . Asthma Brother   . Heart disease Father        died @ 28's.  . Stroke Father   . Diabetes Father   . Hyperlipidemia Father   . Hypertension Father   . Bipolar disorder Father   . Sleep apnea Father   . Alcoholism Father   . Drug abuse Father   . Diabetes Sister   . Heart disease Paternal Grandfather   . Colon cancer Maternal Grandfather   . Breast cancer Maternal Aunt     Social History   Tobacco Use  . Smoking status: Former Smoker    Packs/day: 0.50    Years: 15.00    Pack years: 7.50    Types: Cigarettes    Quit date: 04/24/1985     Years since quitting: 34.7  . Smokeless tobacco: Never Used  Substance Use Topics  . Alcohol use: No  Alcohol/week: 0.0 standard drinks  . Drug use: Not Currently    Types: Marijuana    Home Medications Prior to Admission medications   Medication Sig Start Date End Date Taking? Authorizing Provider  albuterol (PROVENTIL HFA;VENTOLIN HFA) 108 (90 Base) MCG/ACT inhaler Inhale 2 puffs into the lungs every 6 (six) hours as needed for wheezing or shortness of breath.    [provider]  aspirin 81 MG chewable tablet Chew 81 mg by mouth daily.    [provider]  atorvastatin (LIPITOR) 40 MG tablet Take 40 mg by mouth at bedtime.     [provider]  benzonatate (TESSALON) 100 MG capsule Take 1-2 capsules (100-200 mg total) by mouth 3 (three) times daily as needed. 01/12/20   Jaynee Eagles, PA-C  carvedilol (COREG) 12.5 MG tablet Take 12.5 mg by mouth at bedtime. 08/11/19   [provider]  Cholecalciferol (VITAMIN D) 125 MCG (5000 UT) CAPS Take 2 capsules by mouth daily. 10/28/19   [place], Joneen Boers, FNP  cyclobenzaprine (FLEXERIL) 10 MG tablet Take 1 tablet (10 mg total) by mouth at bedtime. 12/21/19   Rayna Sexton, PA-C  diclofenac Sodium (VOLTAREN) 1 % GEL Apply 2 g topically 4 (four) times daily. 09/20/19   [provider]  escitalopram (LEXAPRO) 20 MG tablet Take 20 mg by mouth daily. 02/21/19   [provider]  furosemide (LASIX) 40 MG tablet Take 40 mg by mouth daily.    Rai, Ripudeep K, MD  isosorbide dinitrate (ISORDIL) 20 MG tablet Take 20 mg by mouth 2 (two) times daily.     [provider]  lisinopril (PRINIVIL,ZESTRIL) 20 MG tablet Take 20 mg by mouth daily.  09/21/14   [provider]  loperamide (IMODIUM A-D) 2 MG tablet Take 4 mg by mouth 4 (four) times daily as needed for diarrhea or loose stools.     [provider]  MYRBETRIQ 25 MG TB24 tablet Take 1 tablet (25 mg total) by mouth daily. 03/13/19    Dennard Nip D, MD  omeprazole (PRILOSEC) 20 MG capsule Take 20 mg by mouth 2 (two) times daily. 10/05/16   [provider]  potassium chloride (KLOR-CON M10) 10 MEQ tablet Take 1 tablet (10 mEq total) by mouth 2 (two) times daily. 05/27/18   Jearld Lesch A, DO  promethazine-dextromethorphan (PROMETHAZINE-DM) 6.25-15 MG/5ML syrup Take 5 mLs by mouth at bedtime as needed for cough. 01/12/20   Jaynee Eagles, PA-C  pseudoephedrine (SUDAFED) 30 MG tablet Take 1 tablet (30 mg total) by mouth every 8 (eight) hours as needed for congestion. 01/12/20   Jaynee Eagles, PA-C  Semaglutide,0.25 or 0.5MG /DOS, (OZEMPIC, 0.25 OR 0.5 MG/DOSE,) 2 MG/1.5ML SOPN Inject 0.1875 mLs (0.25 mg total) into the skin once a week. 11/18/19   [place], Joneen Boers, FNP  SitaGLIPtin-MetFORMIN HCl (JANUMET XR) 50-500 MG TB24 Take 1 tablet by mouth daily. 01/05/20   Dennard Nip D, MD  traMADol (ULTRAM) 50 MG tablet Take 1 tablet (50 mg total) by mouth every 6 (six) hours as needed. 11/08/19   Drenda Freeze, MD  traZODone (DESYREL) 50 MG tablet Take 1 tablet by mouth at bedtime. 09/09/19   [provider]  KOMBIGLYZE XR 5-500 MG TB24 Take 1 tablet by mouth daily.  10/11/15 11/26/18  [provider]    Allergies    Penicillins, Hydrocodone-acetaminophen, Latex, and Sulfa antibiotics  Review of Systems   Review of Systems  All other systems reviewed and are negative.   Physical Exam Updated  Vital Signs BP 132/65 (BP Location: Left Arm)   Pulse 88   Temp 98.4 F (36.9 C) (Oral)   Resp 20   Ht 4\' 11"  (1.499 m)   Wt 97.5 kg   SpO2 96%   BMI 43.42 kg/m   Physical Exam Vitals and nursing note reviewed.  Constitutional:      General: She is not in acute distress.    Appearance: She is well-developed.  HENT:     Head: Normocephalic and atraumatic.  Eyes:     Conjunctiva/sclera: Conjunctivae normal.  Cardiovascular:     Rate and Rhythm: Normal rate and regular rhythm.     Heart sounds: No murmur  heard.   Pulmonary:     Effort: Pulmonary effort is normal. No respiratory distress.     Breath sounds: Normal breath sounds.  Abdominal:     Palpations: Abdomen is soft.     Tenderness: There is no abdominal tenderness.  Musculoskeletal:        General: No swelling. Normal range of motion.     Cervical back: Neck supple.     Right lower leg: Edema present.     Left lower leg: Edema present.  Skin:    General: Skin is warm and dry.  Neurological:     Mental Status: She is alert and oriented to person, place, and time.  Psychiatric:        Mood and Affect: Mood normal.        Behavior: Behavior normal.     ED Results / Procedures / Treatments   Labs (all labs ordered are listed, but only abnormal results are displayed) Labs Reviewed  BASIC METABOLIC PANEL - Abnormal; Notable for the following components:      Result Value   Glucose, Bld 156 (*)    Creatinine, Ser 1.01 (*)    All other components within normal limits  RESPIRATORY PANEL BY RT PCR (FLU A&B, COVID)  CBC  BRAIN NATRIURETIC PEPTIDE  TROPONIN I (HIGH SENSITIVITY)  TROPONIN I (HIGH SENSITIVITY)    EKG None  Radiology DG Chest 2 View  Result Date: 01/20/2020 CLINICAL DATA:  Chest pain EXAM: CHEST - 2 VIEW COMPARISON:  11/08/2019 FINDINGS: No focal opacity or pleural effusion. Cardiomediastinal silhouette within normal limits. No pneumothorax. IMPRESSION: No active cardiopulmonary disease. Electronically Signed   By: Donavan Foil M.D.   On: 01/20/2020 21:40    Procedures Procedures (including critical care time)  Medications Ordered in ED Medications - No data to display  ED Course  I have reviewed the triage vital signs and the nursing notes.  Pertinent labs & imaging results that were available during my care of the patient were reviewed by me and considered in my medical decision making (see chart for details).    MDM Rules/Calculators/A&P                          This patient complains of  generalized body aches and CP, this involves an extensive number of treatment options, and is a complaint that carries with it a high risk of complications and morbidity.    Differential Dx COVID, CHF, PE, ACS, dehydration  Pertinent Labs I ordered, reviewed, and interpreted labs, which included covid+, trops 9 x2, doubt ACS.  Normal BNP, normal CXR, doubt CHF or volume overload.  Imaging Interpretation I ordered imaging studies which included CXR.  I independently visualized and interpreted the CXR, which showed no obvious abnormality.   Medications  I ordered medication tylenol for body aches.  Reassessments After the interventions stated above, I reevaluated the patient and found still uncomfortable with body aches and cough, but non-toxic.  Consultants none  Plan DC and hopefully with f/u with [facility] clinic.    Final Clinical Impression(s) / ED Diagnoses Final diagnoses:  [id number]    Rx / DC Orders ED Discharge Orders    None       Montine Circle, PA-C 01/21/20 [id number]    Tegeler, Gwenyth Allegra, MD 01/21/20 [phone]

## 2020-01-21 ENCOUNTER — Telehealth: Payer: Self-pay | Admitting: Internal Medicine

## 2020-01-21 DIAGNOSIS — U071 COVID-19: Secondary | ICD-10-CM | POA: Diagnosis not present

## 2020-01-21 LAB — RESPIRATORY PANEL BY RT PCR (FLU A&B, COVID)
Influenza A by PCR: NEGATIVE
Influenza B by PCR: NEGATIVE
SARS Coronavirus 2 by RT PCR: POSITIVE — AB

## 2020-01-21 LAB — TROPONIN I (HIGH SENSITIVITY): Troponin I (High Sensitivity): 9 ng/L (ref <18)

## 2020-01-21 MED ORDER — BENZONATATE 100 MG PO CAPS
100.0000 mg | ORAL_CAPSULE | Freq: Three times a day (TID) | ORAL | 0 refills | Status: DC
Start: 2020-01-21 — End: 2020-02-03

## 2020-01-21 NOTE — ED Notes (Signed)
Date and time results received: 01/21/20 12:07 AM  (use smartphrase ".now" to insert current time)  Test: covid Critical Value: positive  Name of Provider Notified: Roxanne Mins  Orders Received? Or Actions Taken?: Orders Received - See Orders for details

## 2020-01-21 NOTE — ED Notes (Signed)
Pt did not show or verbalize no signs of dizziness.  Pt oxygen stats stayed at 95% while ambulating.

## 2020-01-21 NOTE — Telephone Encounter (Signed)
I contacted patient regards to her recent + Covid testing, left a message, explained briefly benefits of a monoclonal antibody infusion, encouraged to call us back Kathlene November, MD

## 2020-01-22 ENCOUNTER — Telehealth (HOSPITAL_COMMUNITY): Payer: Self-pay | Admitting: Adult Health

## 2020-01-22 NOTE — Telephone Encounter (Signed)
Called patient regarding monoclonal antibody treatment for COVID 19 given to those who are at risk for complications and/or hospitalization of the virus.  Patient meets criteria based on: HTN, CHF, bmi greater than 25, and diabetes.   She is feeling poorly and notes her symptoms began on 01/09/2020 3 days prior to her urgent care visit on 01/12/2020.  She had rapid test that day which was negative. Her symptoms worsened and she subsequently went to the ER, and was positive.  Unfortunately, she is outside of the window for Monoclonal antibody treatment due to her symptom onset and persistence/worsening that began on 01/09/2020.  I reviewed with her that treating outside of the window is not only ineffective, but it can make her feel worse.  I gave her the phone number to the post [facility] care center, if she needs to be seen and evaluated for care and symptom management.   Wilber Bihari, NP
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
## 2020-01-30 ENCOUNTER — Encounter: Payer: Self-pay | Admitting: Cardiovascular Disease

## 2020-01-30 ENCOUNTER — Telehealth (INDEPENDENT_AMBULATORY_CARE_PROVIDER_SITE_OTHER): Payer: Medicare Other | Admitting: Cardiovascular Disease

## 2020-01-30 ENCOUNTER — Telehealth: Payer: Self-pay | Admitting: *Deleted

## 2020-01-30 ENCOUNTER — Other Ambulatory Visit: Payer: Self-pay

## 2020-01-30 VITALS — BP 173/93 | HR 77

## 2020-01-30 DIAGNOSIS — I5032 Chronic diastolic (congestive) heart failure: Secondary | ICD-10-CM | POA: Diagnosis not present

## 2020-01-30 NOTE — Telephone Encounter (Signed)
  Patient Consent for Virtual Visit         [name] has provided verbal consent on 01/30/2020 for a virtual visit (video or telephone).   CONSENT FOR VIRTUAL VISIT FOR:  [name]  By participating in this virtual visit I agree to the following:  I hereby voluntarily request, consent and authorize [facility] and its employed or contracted physicians, physician assistants, nurse practitioners or other licensed health care professionals (the Practitioner), to provide me with telemedicine health care services (the "Services") as deemed necessary by the treating Practitioner. I acknowledge and consent to receive the Services by the Practitioner via telemedicine. I understand that the telemedicine visit will involve communicating with the Practitioner through live audiovisual communication technology and the disclosure of certain medical information by electronic transmission. I acknowledge that I have been given the opportunity to request an in-person assessment or other available alternative prior to the telemedicine visit and am voluntarily participating in the telemedicine visit.  I understand that I have the right to withhold or withdraw my consent to the use of telemedicine in the course of my care at any time, without affecting my right to future care or treatment, and that the Practitioner or I may terminate the telemedicine visit at any time. I understand that I have the right to inspect all information obtained and/or recorded in the course of the telemedicine visit and may receive copies of available information for a reasonable fee.  I understand that some of the potential risks of receiving the Services via telemedicine include:  [name] Delay or interruption in medical evaluation due to technological equipment failure or disruption; . Information transmitted may not be sufficient (e.g. poor resolution of images) to allow for appropriate medical decision making by the  Practitioner; and/or  . In rare instances, security protocols could fail, causing a breach of personal health information.  Furthermore, I acknowledge that it is my responsibility to provide information about my medical history, conditions and care that is complete and accurate to the best of my ability. I acknowledge that Practitioner's advice, recommendations, and/or decision may be based on factors not within their control, such as incomplete or inaccurate data provided by me or distortions of diagnostic images or specimens that may result from electronic transmissions. I understand that the practice of medicine is not an exact science and that Practitioner makes no warranties or guarantees regarding treatment outcomes. I acknowledge that a copy of this consent can be made available to me via my patient portal ([facility]), or I can request a printed copy by calling the office of [facility].    I understand that my insurance will be billed for this visit.   I have read or had this consent read to me. . I understand the contents of this consent, which adequately explains the benefits and risks of the Services being provided via telemedicine.  . I have been provided ample opportunity to ask questions regarding this consent and the Services and have had my questions answered to my satisfaction. . I give my informed consent for the services to be provided through the use of telemedicine in my medical care

## 2020-01-30 NOTE — Patient Instructions (Signed)
Medication Instructions:  No changes *If you need a refill on your cardiac medications before your next appointment, please call your pharmacy*   Lab Work: none If you have labs (blood work) drawn today and your tests are completely normal, you will receive your results only by: [name] MyChart Message (if you have MyChart) OR . A paper copy in the mail If you have any lab test that is abnormal or we need to change your treatment, we will call you to review the results.   Testing/Procedures: none   Follow-Up: At [facility], you and your health needs are our priority.  As part of our continuing mission to provide you with exceptional heart care, we have created designated Provider Care Teams.  These Care Teams include your primary Cardiologist (physician) and Advanced Practice Providers (APPs -  Physician Assistants and Nurse Practitioners) who all work together to provide you with the care you need, when you need it.  We recommend signing up for the patient portal called "MyChart".  Sign up information is provided on this After Visit Summary.  MyChart is used to connect with patients for Virtual Visits (Telemedicine).  Patients are able to view lab/test results, encounter notes, upcoming appointments, etc.  Non-urgent messages can be sent to your provider as well.   To learn more about what you can do with MyChart, go to [email or web address].    Your next appointment:   12 month(s)  The format for your next appointment:   In Person  Provider:   You may see [name], MD or one of the following Advanced Practice Providers on your designated Care Team:    Truitt Merle, NP  Cecilie Kicks, NP  Kathyrn Drown, NP    Other Instructions

## 2020-02-03 ENCOUNTER — Encounter (INDEPENDENT_AMBULATORY_CARE_PROVIDER_SITE_OTHER): Payer: Self-pay | Admitting: Family Medicine

## 2020-02-03 ENCOUNTER — Other Ambulatory Visit: Payer: Self-pay

## 2020-02-03 ENCOUNTER — Ambulatory Visit (INDEPENDENT_AMBULATORY_CARE_PROVIDER_SITE_OTHER): Payer: Medicare Other | Admitting: Family Medicine

## 2020-02-03 VITALS — BP 111/73 | HR 72 | Temp 98.3°F | Ht 59.0 in | Wt 217.0 lb

## 2020-02-03 DIAGNOSIS — E559 Vitamin D deficiency, unspecified: Secondary | ICD-10-CM | POA: Diagnosis not present

## 2020-02-03 DIAGNOSIS — E1169 Type 2 diabetes mellitus with other specified complication: Secondary | ICD-10-CM

## 2020-02-03 DIAGNOSIS — I10 Essential (primary) hypertension: Secondary | ICD-10-CM

## 2020-02-03 DIAGNOSIS — Z6841 Body Mass Index (BMI) 40.0 and over, adult: Secondary | ICD-10-CM

## 2020-02-03 DIAGNOSIS — Z9189 Other specified personal risk factors, not elsewhere classified: Secondary | ICD-10-CM

## 2020-02-03 DIAGNOSIS — E7849 Other hyperlipidemia: Secondary | ICD-10-CM | POA: Diagnosis not present

## 2020-02-03 NOTE — Progress Notes (Signed)
Chief Complaint:   OBESITY [name] is here to discuss her progress with her obesity treatment plan along with follow-up of her obesity related diagnoses. [name] is on the Category 2 Plan and states she is following her eating plan approximately 80% of the time. [name] states she is doing 0 minutes 0 times per week.  Today's visit was #: 11 Starting weight: 225 lbs Starting date: 11/20/2017 Today's weight: 217 lbs Today's date: 02/03/2020 Total lbs lost to date: 8 Total lbs lost since last in-office visit: 0  Interim History: [name] has been recovering from [place] and unable to follow her plan closely. She is feeling better now and ready to get back on track.  Subjective:   1. Type 2 diabetes mellitus with other specified complication, without long-term current use of insulin (HCC) [name] is not checking her BGs at home. Last A1c was 6.1, and she is on Ozempic at 0.25 mg in addition to her Janumet. She denies hypoglycemia.  2. Vitamin D deficiency [name] is stable on Vit D, and she is due for labs.  3. Essential hypertension [name]'s blood pressure is stable on her medications. She denies chest pain, and she is due for labs.  4. Other hyperlipidemia [name] is working on diet and exercising, and she is due for labs. She denies chest pain.  5. At risk for heart disease [name] is at a higher than average risk for cardiovascular disease due to obesity.   Assessment/Plan:   1. Type 2 diabetes mellitus with other specified complication, without long-term current use of insulin (HCC) Good blood sugar control is important to decrease the likelihood of diabetic complications such as nephropathy, neuropathy, limb loss, blindness, coronary artery disease, and death. Intensive lifestyle modification including diet, exercise and weight loss are the first line of treatment for diabetes. We will check labs today. [name] agreed to increase Ozempic to 0.50 mg q weekly, and will discontinue  Janumet for now. May need to restart metformin based on lab results.  - Vitamin B12 - Insulin, random - Hemoglobin A1c - Microalbumin / creatinine urine ratio - Semaglutide,0.25 or 0.5MG /DOS, (OZEMPIC, 0.25 OR 0.5 MG/DOSE,) 2 MG/1.5ML SOPN; Inject 0.5 mg into the skin once a week.  2. Vitamin D deficiency Low Vitamin D level contributes to fatigue and are associated with obesity, breast, and colon cancer. We will check labs today, and [name] will follow-up for routine testing of Vitamin D, at least 2-3 times per year to avoid over-replacement.  - VITAMIN D 25 Hydroxy (Vit-D Deficiency, Fractures)  3. Essential hypertension [name] is working on healthy weight loss and exercise to improve blood pressure control. We will watch for signs of hypotension as she continues her lifestyle modifications. We will check labs today.  - Comprehensive metabolic panel  4. Other hyperlipidemia Cardiovascular risk and specific lipid/LDL goals reviewed. We discussed several lifestyle modifications today. We will check labs today. [name] will continue to work on diet, exercise and weight loss efforts. Orders and follow up as documented in patient record.   - Lipid Panel With LDL/HDL Ratio  5. At risk for heart disease [name] was given approximately 15 minutes of coronary artery disease prevention counseling today. She is 59 y.o. female and has risk factors for heart disease including obesity. We discussed intensive lifestyle modifications today with an emphasis on specific weight loss instructions and strategies.   Repetitive spaced learning was employed today to elicit superior memory formation and behavioral change.  6. Class 3 severe obesity with  serious comorbidity and body mass index (BMI) of 40.0 to 44.9 in adult, unspecified obesity type (HCC) [name] is currently in the action stage of change. As such, her goal is to continue with weight loss efforts. She has agreed to the Category 2 Plan.    Behavioral modification strategies: increasing lean protein intake, increasing vegetables and no skipping meals.  [name] has agreed to follow-up with our clinic in 2 to 3 weeks. She was informed of the importance of frequent follow-up visits to maximize her success with intensive lifestyle modifications for her multiple health conditions.   [name] was informed we would discuss her lab results at her next visit unless there is a critical issue that needs to be addressed sooner. [name] agreed to keep her next visit at the agreed upon time to discuss these results.  Objective:   Blood pressure 111/73, pulse 72, temperature 98.3 F (36.8 C), height 4\' 11"  (1.499 m), weight 217 lb (98.4 kg), SpO2 97 %. Body mass index is 43.83 kg/m.  General: Cooperative, alert, well developed, in no acute distress. HEENT: Conjunctivae and lids unremarkable. Cardiovascular: Regular rhythm.  Lungs: Normal work of breathing. Neurologic: No focal deficits.   Lab Results  Component Value Date   CREATININE 1.01 (H) 01/20/2020   BUN 16 01/20/2020   NA 140 01/20/2020   K 3.6 01/20/2020   CL 102 01/20/2020   CO2 28 01/20/2020   Lab Results  Component Value Date   ALT 33 08/14/2019   AST 17 08/14/2019   ALKPHOS 68 08/14/2019   BILITOT 0.5 08/14/2019   Lab Results  Component Value Date   HGBA1C 7.5 (H) 10/07/2019   HGBA1C 6.3 (H) 12/26/2018   HGBA1C 6.6 (H) 05/22/2018   HGBA1C 6.8 (H) 03/04/2018   HGBA1C 8.8 (H) 11/20/2017   Lab Results  Component Value Date   INSULIN 28.1 (H) 12/26/2018   INSULIN 20.2 03/04/2018   INSULIN 53.9 (H) 11/20/2017   Lab Results  Component Value Date   TSH 0.458 03/05/2019   Lab Results  Component Value Date   CHOL 119 12/26/2018   HDL 58 12/26/2018   LDLCALC 41 12/26/2018   TRIG 108 12/26/2018   CHOLHDL 4.2 04/23/2011   CHOLHDL 4.3 04/23/2011   Lab Results  Component Value Date   WBC 5.1 01/20/2020   HGB 12.8 01/20/2020   HCT 39.9 01/20/2020   MCV  88.5 01/20/2020   PLT 178 01/20/2020   No results found for: IRON, TIBC, FERRITIN  Attestation Statements:   Reviewed by clinician on day of visit: allergies, medications, problem list, medical history, surgical history, family history, social history, and previous encounter notes.   I, Trixie Dredge, am acting as transcriptionist for Dennard Nip, MD.  I have reviewed the above documentation for accuracy and completeness, and I agree with the above. -  Dennard Nip, MD

## 2020-02-04 LAB — MICROALBUMIN / CREATININE URINE RATIO
Creatinine, Urine: 64.4 mg/dL
Microalb/Creat Ratio: <5 mg/g creat (ref 0–29)
Microalbumin, Urine: <3 ug/mL

## 2020-02-04 LAB — HEMOGLOBIN A1C
Est. average glucose Bld gHb Est-mCnc: 171 mg/dL
Hgb A1c MFr Bld: 7.6 % — ABNORMAL HIGH (ref 4.8–5.6)

## 2020-02-04 LAB — COMPREHENSIVE METABOLIC PANEL
ALT: 31 IU/L (ref 0–32)
AST: 17 IU/L (ref 0–40)
Albumin/Globulin Ratio: 1.4 (ref 1.2–2.2)
Albumin: 4 g/dL (ref 3.8–4.9)
Alkaline Phosphatase: 60 IU/L (ref 44–121)
BUN/Creatinine Ratio: 15 (ref 12–28)
BUN: 14 mg/dL (ref 8–27)
Bilirubin Total: 0.3 mg/dL (ref 0.0–1.2)
CO2: 29 mmol/L (ref 20–29)
Calcium: 9.5 mg/dL (ref 8.7–10.3)
Chloride: 102 mmol/L (ref 96–106)
Creatinine, Ser: 0.94 mg/dL (ref 0.57–1.00)
GFR calc Af Amer: 76 mL/min/{1.73_m2} (ref >59)
GFR calc non Af Amer: 66 mL/min/{1.73_m2} (ref >59)
Globulin, Total: 2.8 g/dL (ref 1.5–4.5)
Glucose: 160 mg/dL — ABNORMAL HIGH (ref 65–99)
Potassium: 4.1 mmol/L (ref 3.5–5.2)
Sodium: 143 mmol/L (ref 134–144)
Total Protein: 6.8 g/dL (ref 6.0–8.5)

## 2020-02-04 LAB — LIPID PANEL WITH LDL/HDL RATIO
Cholesterol, Total: 147 mg/dL (ref 100–199)
HDL: 47 mg/dL (ref >39)
LDL Chol Calc (NIH): 58 mg/dL (ref 0–99)
LDL/HDL Ratio: 1.2 ratio (ref 0.0–3.2)
Triglycerides: 266 mg/dL — ABNORMAL HIGH (ref 0–149)
VLDL Cholesterol Cal: 42 mg/dL — ABNORMAL HIGH (ref 5–40)

## 2020-02-04 LAB — VITAMIN D 25 HYDROXY (VIT D DEFICIENCY, FRACTURES): Vit D, 25-Hydroxy: 44.6 ng/mL (ref 30.0–100.0)

## 2020-02-04 LAB — INSULIN, RANDOM: INSULIN: 44.4 u[IU]/mL — ABNORMAL HIGH (ref 2.6–24.9)

## 2020-02-04 LAB — VITAMIN B12: Vitamin B-12: 457 pg/mL (ref 232–1245)

## 2020-02-09 ENCOUNTER — Other Ambulatory Visit (INDEPENDENT_AMBULATORY_CARE_PROVIDER_SITE_OTHER): Payer: Self-pay | Admitting: Family Medicine

## 2020-02-09 DIAGNOSIS — E1169 Type 2 diabetes mellitus with other specified complication: Secondary | ICD-10-CM

## 2020-02-12 NOTE — Telephone Encounter (Signed)
You do not have this one.

## 2020-02-23 ENCOUNTER — Ambulatory Visit (INDEPENDENT_AMBULATORY_CARE_PROVIDER_SITE_OTHER): Payer: Medicare Other | Admitting: Family Medicine

## 2020-02-23 ENCOUNTER — Other Ambulatory Visit: Payer: Self-pay

## 2020-02-23 ENCOUNTER — Encounter (INDEPENDENT_AMBULATORY_CARE_PROVIDER_SITE_OTHER): Payer: Self-pay | Admitting: Family Medicine

## 2020-02-23 VITALS — HR 74 | Temp 97.9°F | Ht 59.0 in | Wt 219.0 lb

## 2020-02-23 DIAGNOSIS — Z6841 Body Mass Index (BMI) 40.0 and over, adult: Secondary | ICD-10-CM | POA: Diagnosis not present

## 2020-02-23 DIAGNOSIS — F3289 Other specified depressive episodes: Secondary | ICD-10-CM | POA: Diagnosis not present

## 2020-02-23 MED ORDER — TOPIRAMATE 25 MG PO TABS: 25.0000 mg | ORAL_TABLET | Freq: Every day | ORAL | 0 refills | Status: DC

## 2020-02-24 NOTE — Progress Notes (Signed)
Chief Complaint:   OBESITY [name] is here to discuss her progress with her obesity treatment plan along with follow-up of her obesity related diagnoses. [name] is on the Category 2 Plan and states she is following her eating plan approximately 80% of the time. [name] states she is walking for 20 minutes 4 times per week.  Today's visit was #: 2 Starting weight: 225 lbs Starting date: 11/20/2017 Today's weight: 219 lbs Today's date: 02/23/2020 Total lbs lost to date: 6 Total lbs lost since last in-office visit: 0  Interim History: [name] did some celebration eating over Halloween, and she is retaining some fluid today. She struggles with stress especially with family issues right now.  Subjective:   1. Other depression with emotional eating [name] is struggling with increased emotional eating lately, worse with increased stress.  Assessment/Plan:   1. Other depression with emotional eating Behavior modification techniques were discussed today to help [name] deal with her emotional/non-hunger eating behaviors. [name] agreed to start topiramate 25 mg qhs with no refills. Orders and follow up as documented in patient record.   - topiramate (TOPAMAX) 25 MG tablet; Take 1 tablet (25 mg total) by mouth at bedtime.  Dispense: 30 tablet; Refill: 0  2. Class 3 severe obesity with serious comorbidity and body mass index (BMI) of 40.0 to 44.9 in adult, unspecified obesity type (HCC) [name] is currently in the action stage of change. As such, her goal is to continue with weight loss efforts. She has agreed to the Category 2 Plan.   Exercise goals: Increase walking to 20 minutes 6 times per week with [name].  Behavioral modification strategies: meal planning and cooking strategies.  [name] has agreed to follow-up with our clinic in 2 to 3 weeks. She was informed of the importance of frequent follow-up visits to maximize her success with intensive lifestyle modifications for her  multiple health conditions.   Objective:   Pulse 74, temperature 97.9 F (36.6 C), height 4\' 11"  (1.499 m), weight 219 lb (99.3 kg), SpO2 97 %. Body mass index is 44.23 kg/m.  General: Cooperative, alert, well developed, in no acute distress. HEENT: Conjunctivae and lids unremarkable. Cardiovascular: Regular rhythm.  Lungs: Normal work of breathing. Neurologic: No focal deficits.   Lab Results  Component Value Date   CREATININE 0.94 02/03/2020   BUN 14 02/03/2020   NA 143 02/03/2020   K 4.1 02/03/2020   CL 102 02/03/2020   CO2 29 02/03/2020   Lab Results  Component Value Date   ALT 31 02/03/2020   AST 17 02/03/2020   ALKPHOS 60 02/03/2020   BILITOT 0.3 02/03/2020   Lab Results  Component Value Date   HGBA1C 7.6 (H) 02/03/2020   HGBA1C 7.5 (H) 10/07/2019   HGBA1C 6.3 (H) 12/26/2018   HGBA1C 6.6 (H) 05/22/2018   HGBA1C 6.8 (H) 03/04/2018   Lab Results  Component Value Date   INSULIN 44.4 (H) 02/03/2020   INSULIN 28.1 (H) 12/26/2018   INSULIN 20.2 03/04/2018   INSULIN 53.9 (H) 11/20/2017   Lab Results  Component Value Date   TSH 0.458 03/05/2019   Lab Results  Component Value Date   CHOL 147 02/03/2020   HDL 47 02/03/2020   LDLCALC 58 02/03/2020   TRIG 266 (H) 02/03/2020   CHOLHDL 4.2 04/23/2011   CHOLHDL 4.3 04/23/2011   Lab Results  Component Value Date   WBC 5.1 01/20/2020   HGB 12.8 01/20/2020   HCT 39.9 01/20/2020   MCV 88.5  01/20/2020   PLT 178 01/20/2020   No results found for: IRON, TIBC, FERRITIN  Obesity Behavioral Intervention:   Approximately 15 minutes were spent on the discussion below.  ASK: We discussed the diagnosis of obesity with [name] today and [name] agreed to give [name] permission to discuss obesity behavioral modification therapy today.  ASSESS: [name] has the diagnosis of obesity and her BMI today is 44.21. [place]en is in the action stage of change.   ADVISE: [name] was educated on the multiple health risks of obesity as  well as the benefit of weight loss to improve her health. She was advised of the need for long term treatment and the importance of lifestyle modifications to improve her current health and to decrease her risk of future health problems.  AGREE: Multiple dietary modification options and treatment options were discussed and [name] agreed to follow the recommendations documented in the above note.  ARRANGE: [name] was educated on the importance of frequent visits to treat obesity as outlined per CMS and USPSTF guidelines and agreed to schedule her next follow up appointment today.  Attestation Statements:   Reviewed by clinician on day of visit: allergies, medications, problem list, medical history, surgical history, family history, social history, and previous encounter notes.   I, Trixie Dredge, am acting as transcriptionist for Dennard Nip, MD.  I have reviewed the above documentation for accuracy and completeness, and I agree with the above. -  Dennard Nip, MD

## 2020-03-01 IMAGING — CR DG CHEST 2V
2 series · 2 of 2 positions shown · non-contrast
Comparison: 06/10/2017

CLINICAL DATA: Anterior central chest pain, weakness, and feeling
sick since yesterday

EXAM:
CHEST - 2 VIEW

[chest pa]
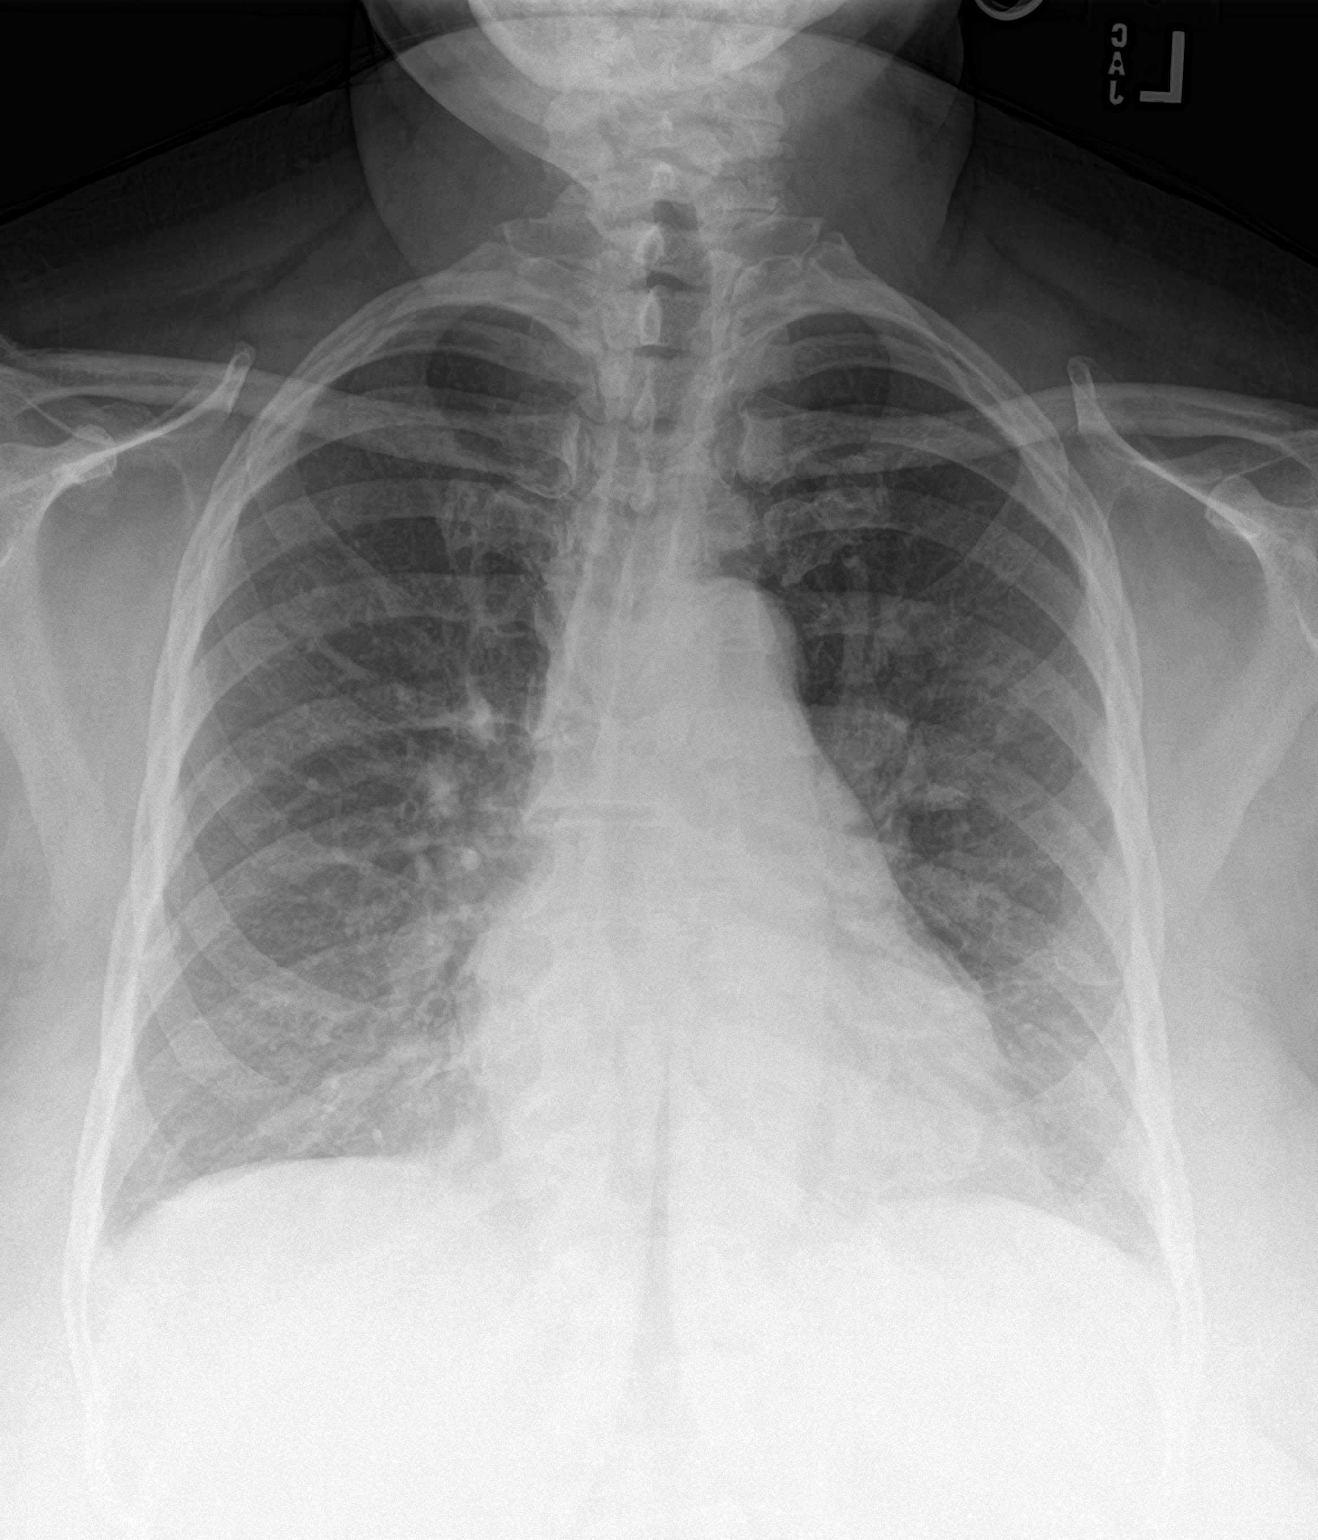

[chest lat]
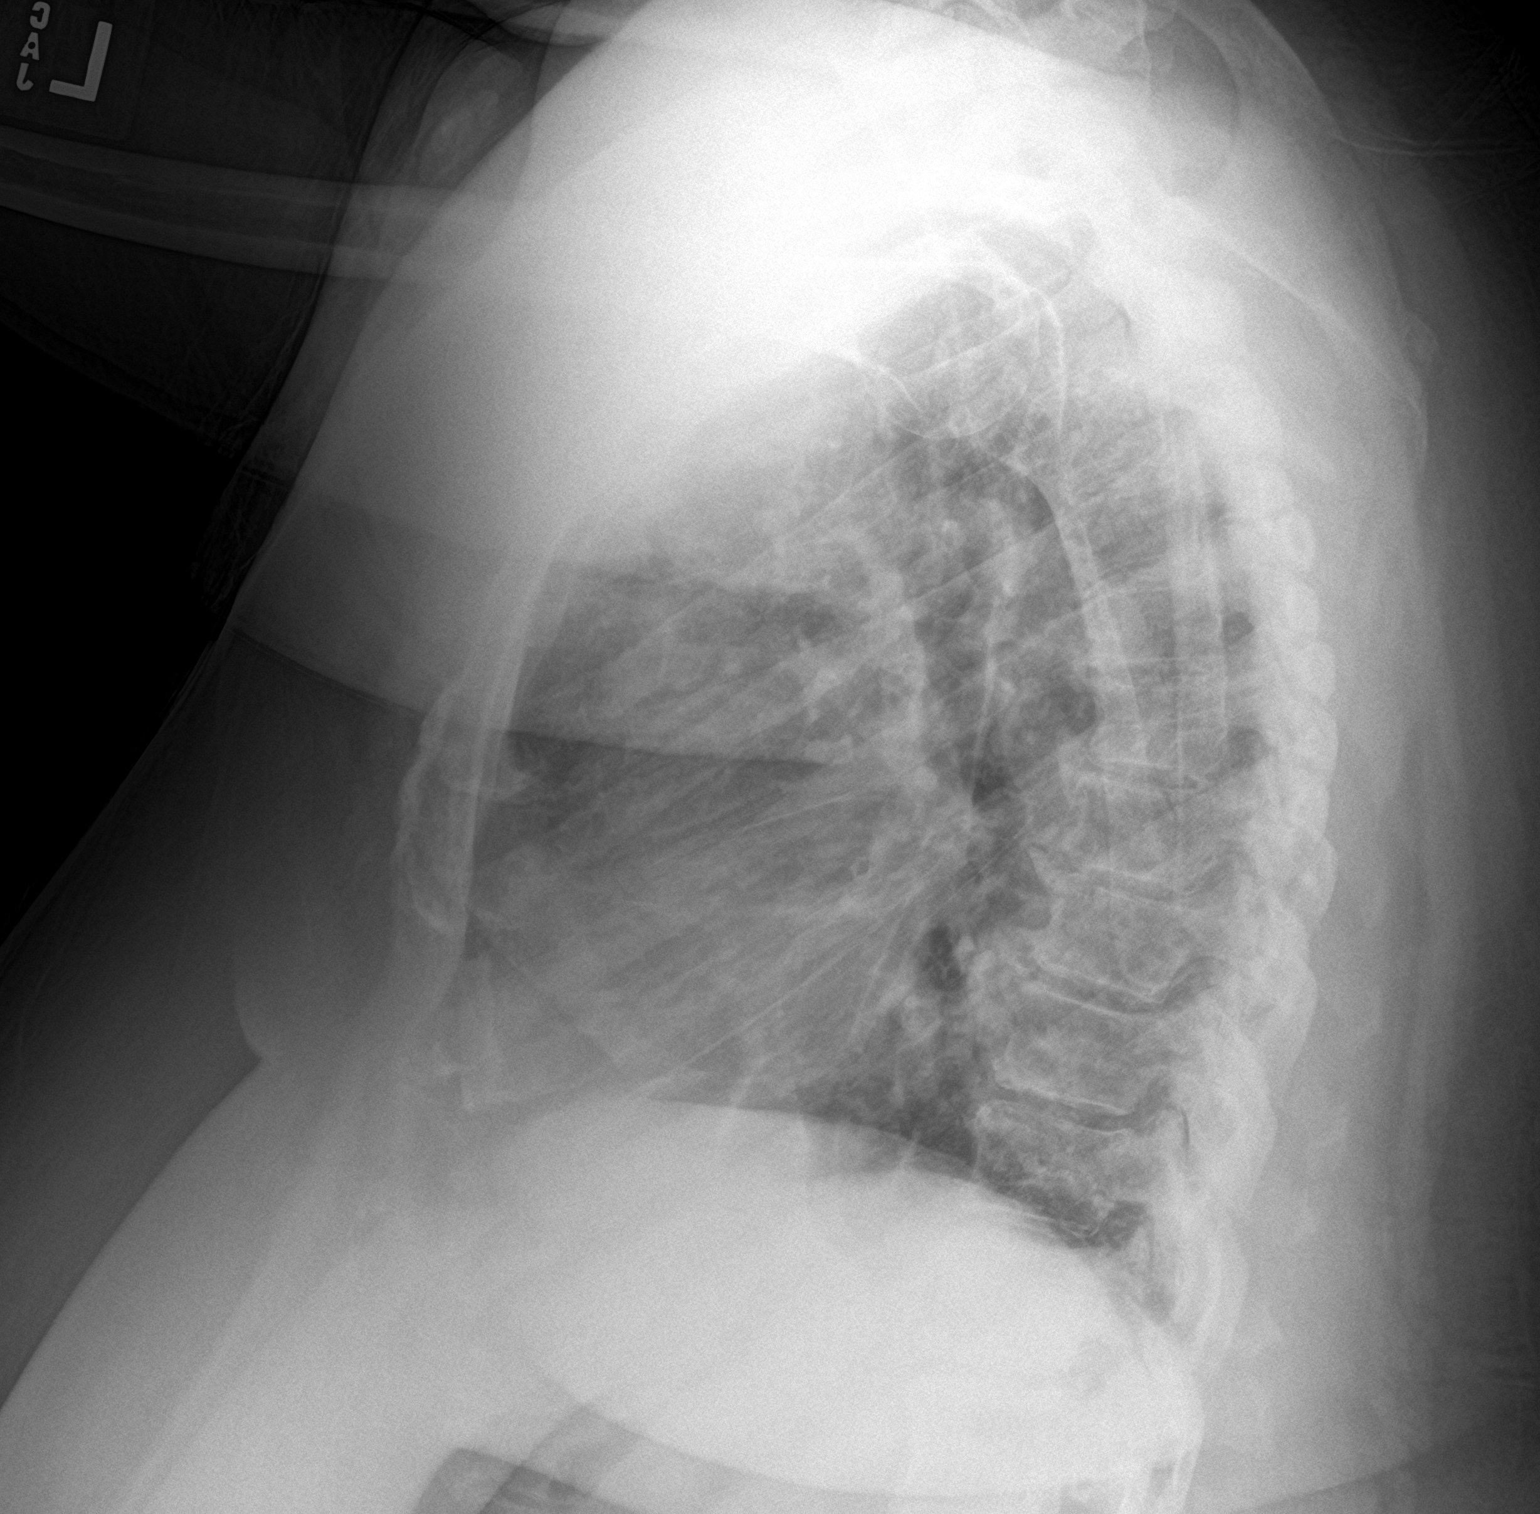

[2 of 2 positions shown; findings below may reference images not displayed]

FINDINGS: Borderline enlargement of cardiac silhouette with pulmonary vascular
congestion.

Mediastinal contour stable.

Mild chronic central peribronchial thickening.

Lungs otherwise clear.

No pulmonary infiltrate, pleural effusion or pneumothorax.

Scattered endplate spur formation thoracic spine.
IMPRESSION: Borderline enlargement of cardiac silhouette with chronic pulmonary
vascular congestion.

Mild chronic bronchitic changes without acute infiltrate.

## 2020-03-02 IMAGING — DX DG HIP (WITH OR WITHOUT PELVIS) 2-3V*L*
3 series · 3 of 3 positions shown · non-contrast
Comparison: Left hip radiographs performed 08/15/2017

CLINICAL DATA: Status post fall, with left groin pain. Initial
encounter.

EXAM:
DG HIP (WITH OR WITHOUT PELVIS) 2-3V LEFT

[pelvis ap (1 of 2)]
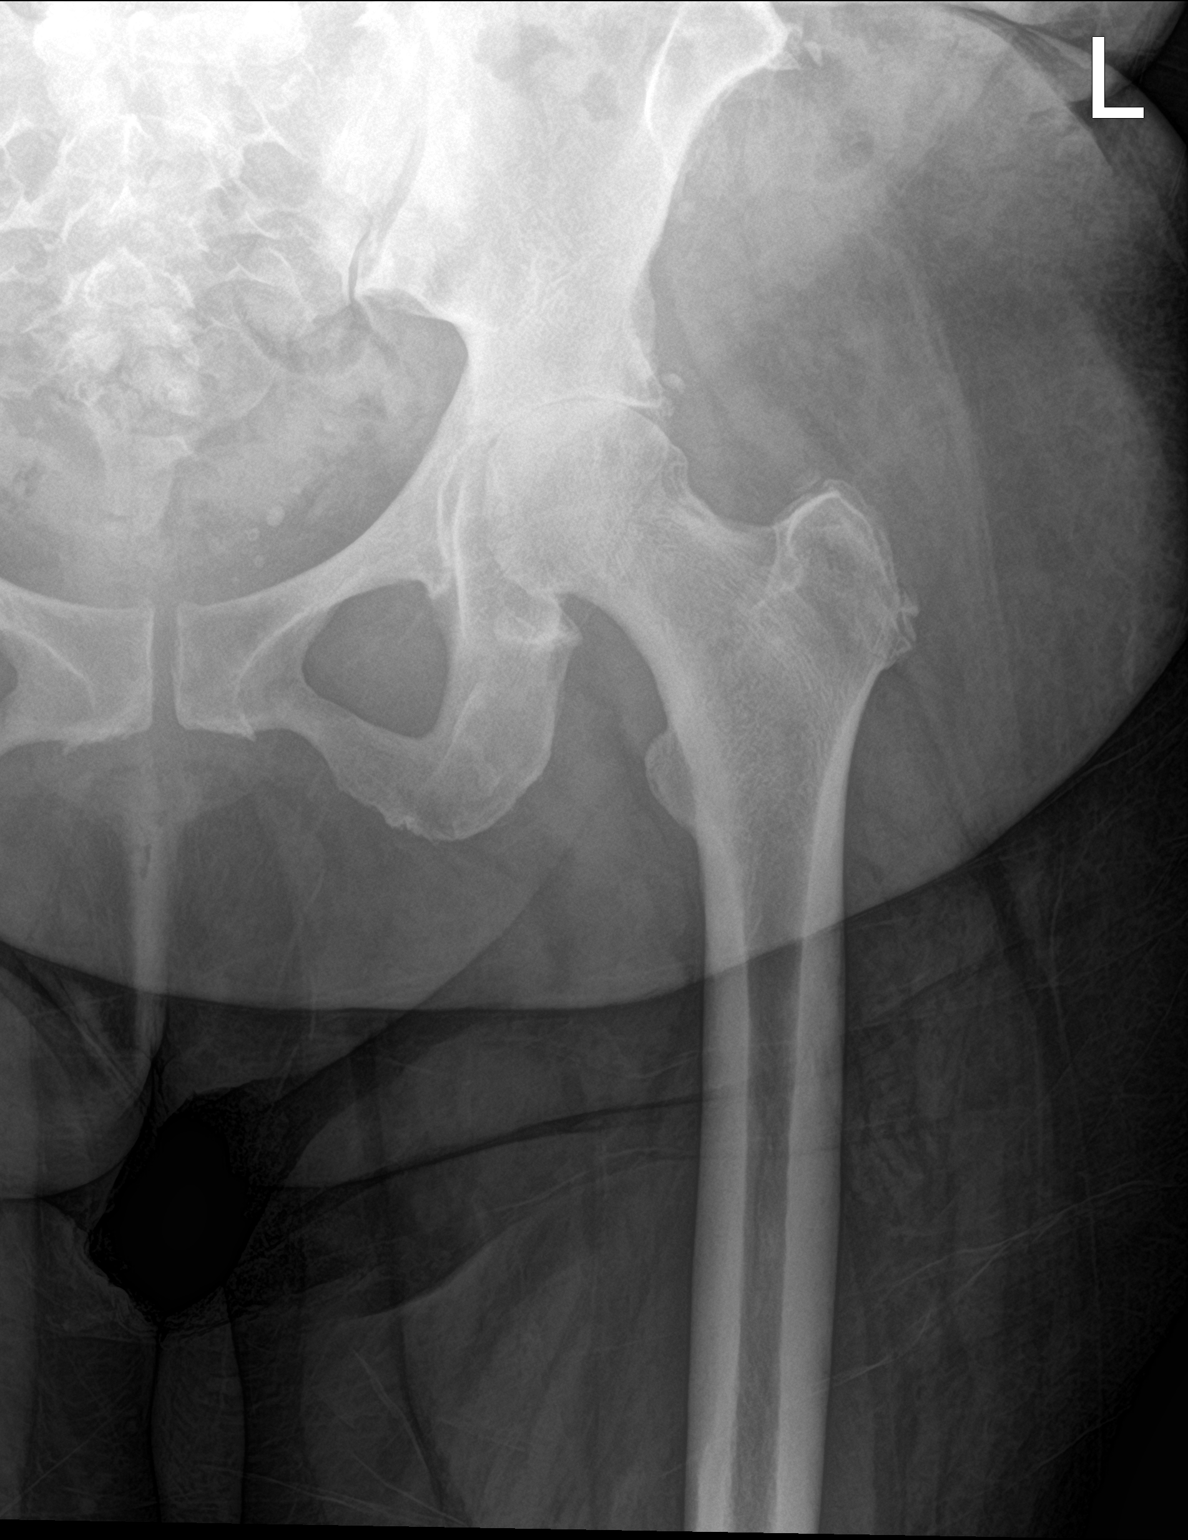

[hip lat]
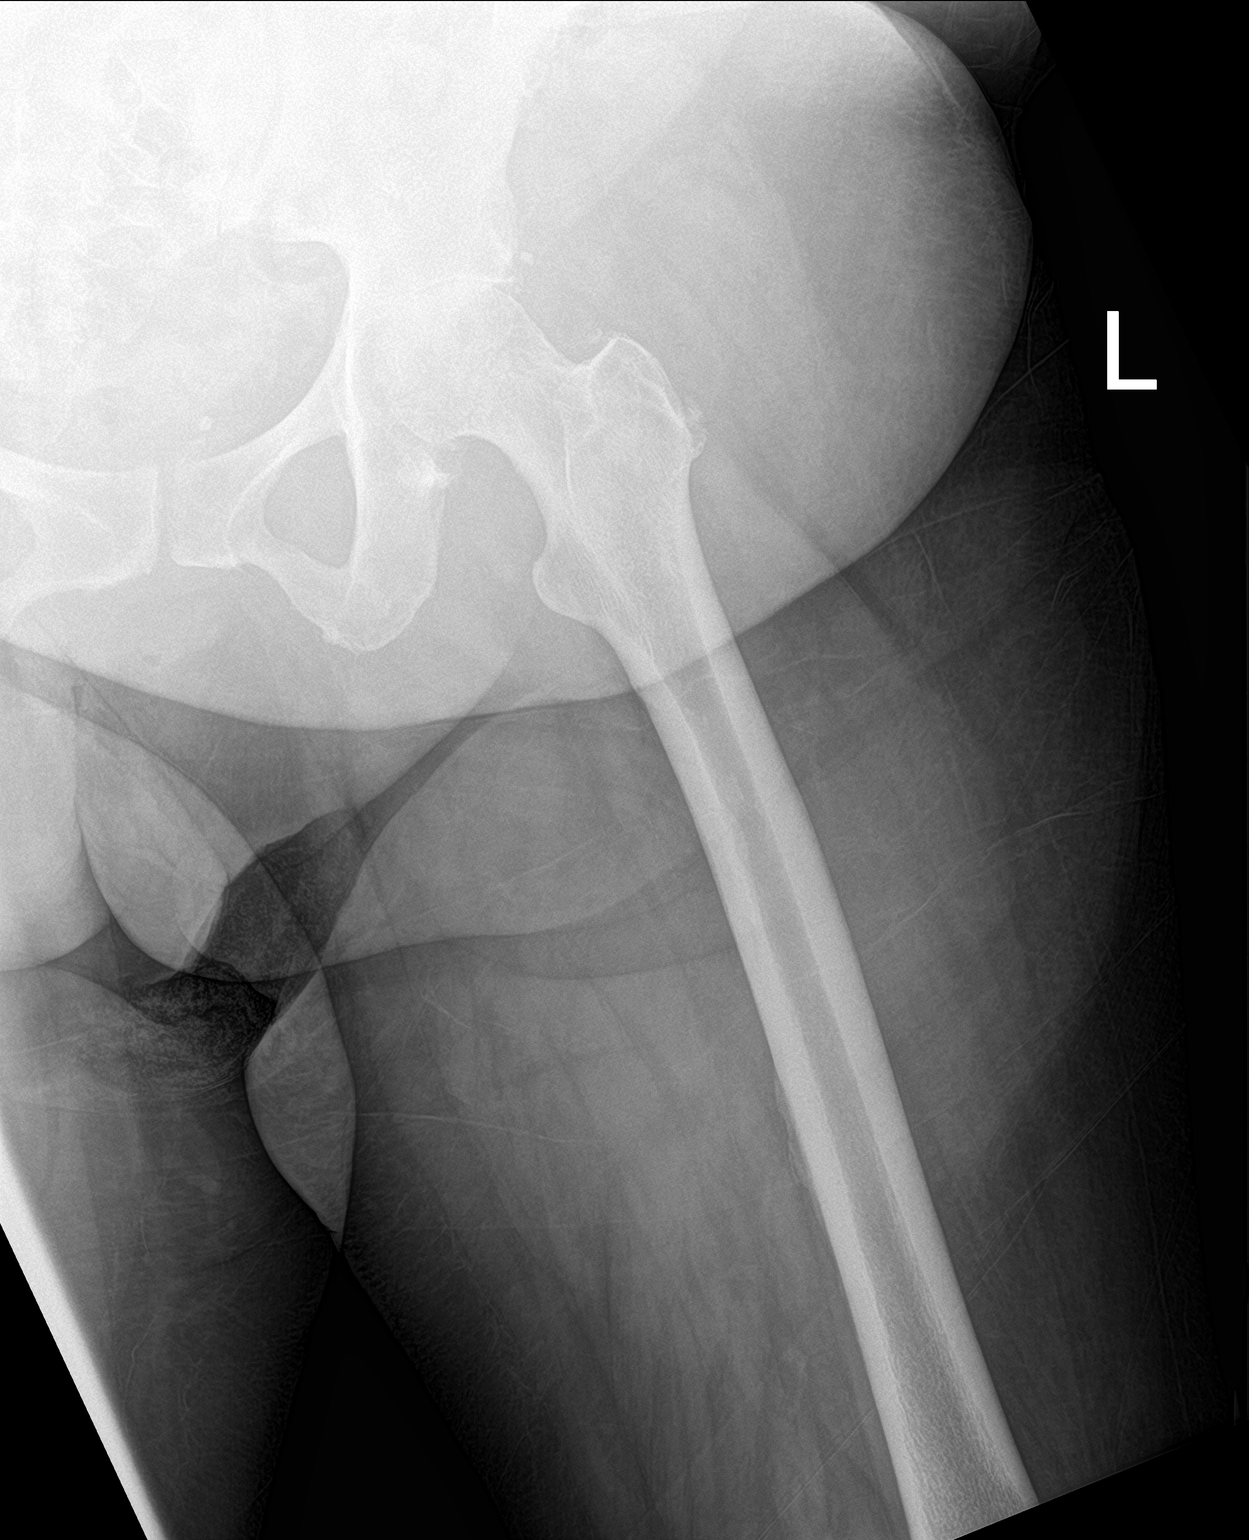

[pelvis ap (2 of 2)]
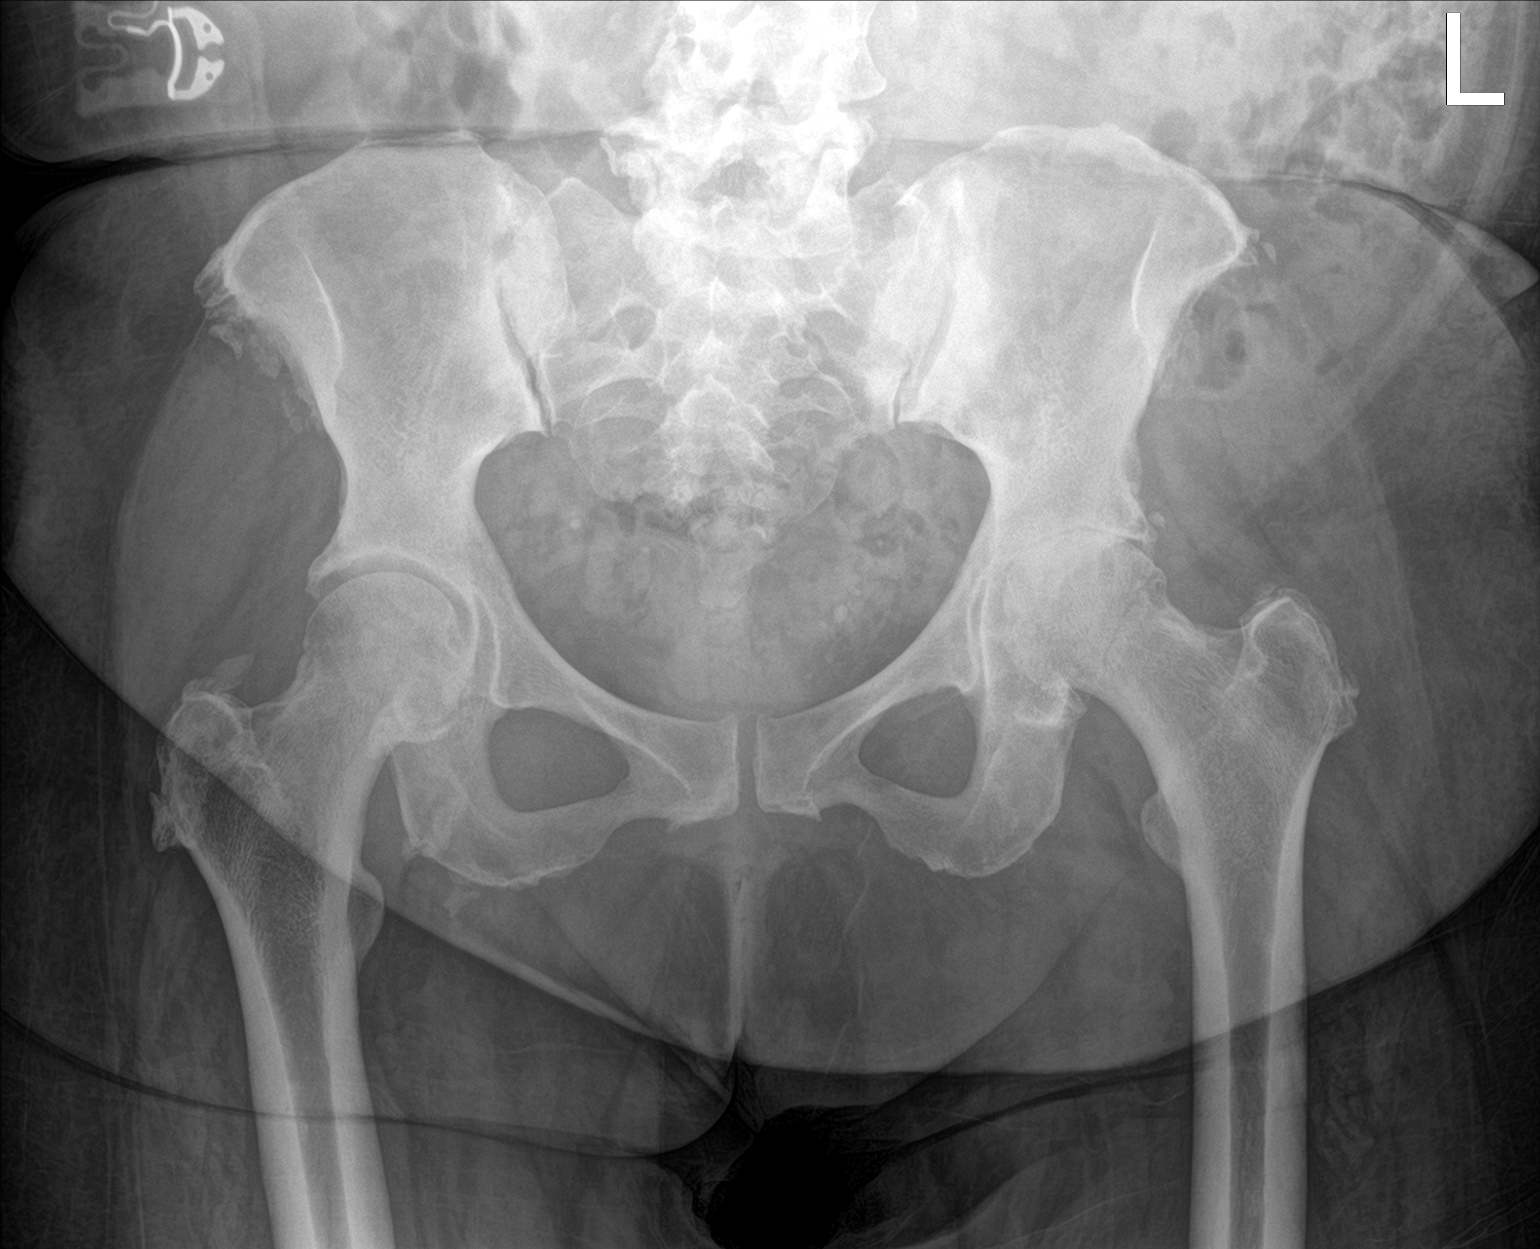

[3 of 3 positions shown; findings below may reference images not displayed]

FINDINGS: There is no evidence of fracture or dislocation. Both femoral heads
are seated within their respective acetabula. There is worsening
superior joint space narrowing at the left hip, with associated
sclerosis and cortical irregularity. Lucency across the left femoral
neck is thought to reflect the underlying acetabulum.

Sclerosis is noted along the sacroiliac joints. Mild degenerative
change is noted at the lower lumbar spine.

The visualized bowel gas pattern is grossly unremarkable in
appearance.
IMPRESSION: 1. No evidence of fracture or dislocation.
2. Worsening superior joint space narrowing at the left hip, with
associated sclerosis and cortical irregularity.

## 2020-03-08 ENCOUNTER — Ambulatory Visit (INDEPENDENT_AMBULATORY_CARE_PROVIDER_SITE_OTHER): Payer: Medicare Other | Admitting: Family Medicine

## 2020-03-10 ENCOUNTER — Other Ambulatory Visit (INDEPENDENT_AMBULATORY_CARE_PROVIDER_SITE_OTHER): Payer: Self-pay | Admitting: Family Medicine

## 2020-03-10 DIAGNOSIS — E1169 Type 2 diabetes mellitus with other specified complication: Secondary | ICD-10-CM

## 2020-03-15 ENCOUNTER — Other Ambulatory Visit (INDEPENDENT_AMBULATORY_CARE_PROVIDER_SITE_OTHER): Payer: Self-pay | Admitting: Family Medicine

## 2020-03-15 ENCOUNTER — Encounter (INDEPENDENT_AMBULATORY_CARE_PROVIDER_SITE_OTHER): Payer: Self-pay

## 2020-03-15 ENCOUNTER — Ambulatory Visit: Payer: Medicare Other | Admitting: Adult Health

## 2020-03-15 DIAGNOSIS — E1169 Type 2 diabetes mellitus with other specified complication: Secondary | ICD-10-CM

## 2020-03-15 NOTE — Progress Notes (Signed)
PATIENT: [name] DOB: 1961-01-05  REASON FOR VISIT: follow up HISTORY FROM: patient  HISTORY OF PRESENT ILLNESS: Today 03/15/20: [name] is a 59 year old female with a history of obstructive sleep apnea on CPAP.  Her download indicates that she use her machine 23 out of 30 days for compliance of 77%.  She used her machine greater than 4 hours each night.  On average she uses her machine 6 hours and 47 minutes.  Her residual AHI is 0.7 on 16 cm of water with EPR of 3.  Leak in the 95th percentile is 31.  She reports that she sometimes does not hear her phone ringing during the night because she is in such a deep sleep.  She returns today for an evaluation.  HISTORY 03/13/19:  [name] is a 59 year old female with a history of obstructive sleep apnea on CPAP.  She returns today for follow-up.  Her download indicates that she use her machine 54 out of 90 days for compliance of 60%.  She use her machine greater than 4 hours 52 days for compliance of 58%.  On average she uses her machine 7 hours and 18 minutes.  Her residual AHI is 0.6 on 16 cm of water with EPR of 3.  Her leak in the 95th percentile is 30 L/min.  She states that she does feel the mask leaking at night.  She reports that she does change out her supplies regularly.  She returns today for follow-up.  REVIEW OF SYSTEMS: Out of a complete 14 system review of symptoms, the patient complains only of the following symptoms, and all other reviewed systems are negative.  FSS 43 ESS 5  ALLERGIES: Allergies  Allergen Reactions  . Penicillins Swelling and Other (See Comments)    Has patient had a PCN reaction causing immediate rash, facial/tongue/throat swelling, SOB or lightheadedness with hypotension: YES Has patient had a PCN reaction causing severe rash involving mucus membranes or skin necrosis: NO Has patient had a PCN reaction that required hospitalization NO Has patient had a PCN reaction occurring  within the last 10 years: NO If all of the above answers are "NO", then may proceed with Cephalosporin use.  [name] Hydrocodone-Acetaminophen Itching    confusion  . Latex Rash  . Sulfa Antibiotics Diarrhea, Itching and Rash    HOME MEDICATIONS: Outpatient Medications Prior to Visit  Medication Sig Dispense Refill  . albuterol (PROVENTIL HFA;VENTOLIN HFA) 108 (90 Base) MCG/ACT inhaler Inhale 2 puffs into the lungs every 6 (six) hours as needed for wheezing or shortness of breath.    [name] aspirin 81 MG chewable tablet Chew 81 mg by mouth daily.    [name] atorvastatin (LIPITOR) 40 MG tablet Take 40 mg by mouth at bedtime.     . busPIRone (BUSPAR) 30 MG tablet Take 30 mg by mouth 2 (two) times daily.    . carvedilol (COREG) 12.5 MG tablet Take 12.5 mg by mouth at bedtime.    . Cholecalciferol (VITAMIN D) 125 MCG (5000 UT) CAPS Take 2 capsules by mouth daily. 60 capsule 0  . cyclobenzaprine (FLEXERIL) 10 MG tablet Take 1 tablet (10 mg total) by mouth at bedtime. 5 tablet 0  . diclofenac Sodium (VOLTAREN) 1 % GEL Apply 2 g topically 4 (four) times daily.    [name] escitalopram (LEXAPRO) 20 MG tablet Take 20 mg by mouth daily.    . furosemide (LASIX) 40 MG tablet Take 40 mg by mouth daily.    . isosorbide  dinitrate (ISORDIL) 20 MG tablet Take 20 mg by mouth 2 (two) times daily.     [name] lisinopril (PRINIVIL,ZESTRIL) 20 MG tablet Take 20 mg by mouth daily.     [name] loperamide (IMODIUM A-D) 2 MG tablet Take 4 mg by mouth 4 (four) times daily as needed for diarrhea or loose stools.     . meloxicam (MOBIC) 15 MG tablet Take 15 mg by mouth daily as needed.    [name] MYRBETRIQ 25 MG TB24 tablet Take 1 tablet (25 mg total) by mouth daily. 30 tablet 0  . omeprazole (PRILOSEC) 20 MG capsule Take 20 mg by mouth 2 (two) times daily.    . potassium chloride (KLOR-CON M10) 10 MEQ tablet Take 1 tablet (10 mEq total) by mouth 2 (two) times daily. 60 tablet 1  . promethazine-dextromethorphan (PROMETHAZINE-DM) 6.25-15 MG/5ML syrup Take 5  mLs by mouth at bedtime as needed for cough. 100 mL 0  . pseudoephedrine (SUDAFED) 30 MG tablet Take 1 tablet (30 mg total) by mouth every 8 (eight) hours as needed for congestion. 30 tablet 0  . Semaglutide,0.25 or 0.5MG /DOS, (OZEMPIC, 0.25 OR 0.5 MG/DOSE,) 2 MG/1.5ML SOPN Inject 0.5 mg into the skin once a week. DX:E11.69 1.5 mL 0  . tiZANidine (ZANAFLEX) 4 MG tablet Take 4 mg by mouth every 8 (eight) hours as needed.    . topiramate (TOPAMAX) 25 MG tablet Take 1 tablet (25 mg total) by mouth at bedtime. 30 tablet 0  . traMADol (ULTRAM) 50 MG tablet Take 1 tablet (50 mg total) by mouth every 6 (six) hours as needed. 10 tablet 0  . traZODone (DESYREL) 50 MG tablet Take 1 tablet by mouth at bedtime.     No facility-administered medications prior to visit.    PAST MEDICAL HISTORY: Past Medical History:  Diagnosis Date  . Anginal pain ([place])    a. NL cath in 2008;  b. Myoview 03/2011: dec uptake along mid anterior wall on stress imaging -> ? attenuation vs. ischemia, EF 65%;  c. Echo 04/2011: EF 55-60%, no RWMA, Gr 2 dd  . Anxiety   . Arthritis   . Asthma   . Back pain   . Bone cancer ([place])   . Cancer ([place])   . Chest pain   . CHF (congestive heart failure) ([place])   . CHF (congestive heart failure) ([place])   . Depression   . Diabetes mellitus   . Drug use   . Dyspnea    with exertion  . Dyspnea   . Frequent urination   . GERD (gastroesophageal reflux disease)   . Glaucoma   . HLD (hyperlipidemia)   . Hypertension   . IBS (irritable bowel syndrome)   . Joint pain   . Lactose intolerance   . Leg edema   . 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 Obesity   . Palpitations   . Pneumonia 05/2016   double  . Pulmonary edema   . Pulmonary embolism ([place])    a. 2008 -> coumadin x 6 mos.  . Rheumatoid arthritis ([place])   . Sleep apnea    on CPAP 02/2018  . TIA (transient ischemic attack)   . Urinary urgency     PAST SURGICAL HISTORY: Past Surgical History:    Procedure Laterality Date  . ABDOMINAL HYSTERECTOMY  2005  . APPENDECTOMY    . CARDIAC CATHETERIZATION     Normal  . CARDIAC CATHETERIZATION N/A 09/30/2014   Procedure: Left  Heart Cath and Coronary Angiography;  Surgeon: Sherren Mocha, MD;  Location: [facility];  Service: Cardiovascular;  Laterality: N/A;  . LAPAROSCOPIC APPENDECTOMY N/A 06/03/2012   Procedure: APPENDECTOMY LAPAROSCOPIC;  Surgeon: Stark Klein, MD;  Location: [facility];  Service: General;  Laterality: N/A;  . Left knee surgery  2008  . LEG SURGERY    . TONSILLECTOMY    . TOTAL HIP ARTHROPLASTY Left 06/11/2018   Procedure: LEFT TOTAL HIP ARTHROPLASTY ANTERIOR APPROACH;  Surgeon: Meredith Pel, MD;  Location: [facility];  Service: Orthopedics;  Laterality: Left;  . TOTAL KNEE ARTHROPLASTY Left 08/22/2016   Procedure: TOTAL KNEE ARTHROPLASTY;  Surgeon: Meredith Pel, MD;  Location: [facility];  Service: Orthopedics;  Laterality: Left;  . TUBAL LIGATION  1989    FAMILY HISTORY: Family History  Problem Relation Age of Onset  . Emphysema Mother   . Arthritis Mother   . Heart failure Mother        alive @ 67  . Stroke Mother   . Diabetes Mother   . Hypertension Mother   . Hyperlipidemia Mother   . Depression Mother   . Anxiety disorder Mother   . Asthma Brother   . Heart disease Father        died @ 19's.  . Stroke Father   . Diabetes Father   . Hyperlipidemia Father   . Hypertension Father   . Bipolar disorder Father   . Sleep apnea Father   . Alcoholism Father   . Drug abuse Father   . Diabetes Sister   . Heart disease Paternal Grandfather   . Colon cancer Maternal Grandfather   . Breast cancer Maternal Aunt     SOCIAL HISTORY: Social History   Socioeconomic History  . Marital status: Divorced    Spouse name: Not on file  . Number of children: Not on file  . Years of education: Not on file  . Highest education level: Not on file  Occupational History  . Occupation: UNEMPLOYED    Employer:  DISABLED  Tobacco Use  . Smoking status: Former Smoker    Packs/day: 0.50    Years: 15.00    Pack years: 7.50    Types: Cigarettes    Quit date: 04/24/1985    Years since quitting: 34.9  . Smokeless tobacco: Never Used  Substance and Sexual Activity  . Alcohol use: No    Alcohol/week: 0.0 standard drinks  . Drug use: Not Currently    Types: Marijuana  . Sexual activity: Never  Other Topics Concern  . Not on file  Social History Narrative   Lives in [place] by herself.  Disabled.  Caffeine 1 cup avg daily.  3 kids, 6 grandkids.     Social Determinants of Health   Financial Resource Strain:   . Difficulty of Paying Living Expenses: Not on file  Food Insecurity:   . Worried About Charity fundraiser in the Last Year: Not on file  . Ran Out of Food in the Last Year: Not on file  Transportation Needs:   . Lack of Transportation (Medical): Not on file  . Lack of Transportation (Non-Medical): Not on file  Physical Activity:   . Days of Exercise per Week: Not on file  . Minutes of Exercise per Session: Not on file  Stress:   . Feeling of Stress : Not on file  Social Connections:   . Frequency of Communication with Friends and Family: Not on file  . Frequency  of Social Gatherings with Friends and Family: Not on file  . Attends Religious Services: Not on file  . Active Member of Clubs or Organizations: Not on file  . Attends Archivist Meetings: Not on file  . Marital Status: Not on file  Intimate Partner Violence:   . Fear of Current or Ex-Partner: Not on file  . Emotionally Abused: Not on file  . Physically Abused: Not on file  . Sexually Abused: Not on file      PHYSICAL EXAM  Vitals:   03/16/20 1127  BP: 126/82  Pulse: 78  Weight: 220 lb 12.8 oz (100.2 kg)  Height: 4\' 11"  (1.499 m)   Body mass index is 44.6 kg/m.  Generalized: Well developed, in no acute distress  Chest: Lungs clear to auscultation bilaterally  Neurological examination  Mentation:  Alert oriented to time, place, history taking. Follows all commands speech and language fluent Cranial nerve II-XII: Extraocular movements were full, visual field were full on confrontational test Head turning and shoulder shrug  were normal and symmetric. Motor: The motor testing reveals 5 over 5 strength of all 4 extremities. Good symmetric motor tone is noted throughout.  Sensory: Sensory testing is intact to soft touch on all 4 extremities. No evidence of extinction is noted.  Gait and station: Gait is normal.    DIAGNOSTIC DATA (LABS, IMAGING, TESTING) - I reviewed patient records, labs, notes, testing and imaging myself where available.  Lab Results  Component Value Date   WBC 5.1 01/20/2020   HGB 12.8 01/20/2020   HCT 39.9 01/20/2020   MCV 88.5 01/20/2020   PLT 178 01/20/2020      Component Value Date/Time   NA 143 02/03/2020 0845   K 4.1 02/03/2020 0845   CL 102 02/03/2020 0845   CO2 29 02/03/2020 0845   GLUCOSE 160 (H) 02/03/2020 0845   GLUCOSE 156 (H) 01/20/2020 2050   BUN 14 02/03/2020 0845   CREATININE 0.94 02/03/2020 0845   CALCIUM 9.5 02/03/2020 0845   PROT 6.8 02/03/2020 0845   ALBUMIN 4.0 02/03/2020 0845   AST 17 02/03/2020 0845   ALT 31 02/03/2020 0845   ALKPHOS 60 02/03/2020 0845   BILITOT 0.3 02/03/2020 0845   GFRNONAA 66 02/03/2020 0845   GFRAA 76 02/03/2020 0845   Lab Results  Component Value Date   CHOL 147 02/03/2020   HDL 47 02/03/2020   LDLCALC 58 02/03/2020   TRIG 266 (H) 02/03/2020   CHOLHDL 4.2 04/23/2011   CHOLHDL 4.3 04/23/2011   Lab Results  Component Value Date   HGBA1C 7.6 (H) 02/03/2020   Lab Results  Component Value Date   VITAMINB12 457 02/03/2020   Lab Results  Component Value Date   TSH 0.458 03/05/2019      ASSESSMENT AND PLAN 59 y.o. year old female  has a past medical history of Anginal pain ([place]), Anxiety, Arthritis, Asthma, Back pain, Bone cancer ([place]), Cancer ([place]), Chest pain, CHF (congestive heart failure)  ([place]), CHF (congestive heart failure) ([place]), Depression, Diabetes mellitus, Drug use, Dyspnea, Dyspnea, Frequent urination, GERD (gastroesophageal reflux disease), Glaucoma, HLD (hyperlipidemia), Hypertension, IBS (irritable bowel syndrome), Joint pain, Lactose intolerance, Leg edema, Mediastinal mass, Obesity, Palpitations, Pneumonia (05/2016), Pulmonary edema, Pulmonary embolism ([place]), Rheumatoid arthritis ([place]), Sleep apnea, TIA (transient ischemic attack), and Urinary urgency. here with:  1. OSA on CPAP  - CPAP compliance excellent - Good treatment of AHI  - Encourage patient to use CPAP nightly and > 4 hours each night -  F/U in 1 year or sooner if needed   I spent 25 minutes of face-to-face and non-face-to-face time with patient.  This included previsit chart review, lab review, study review, order entry, electronic health record documentation, patient education.  Ward Givens, MSN, NP-C 03/15/2020, 4:42 PM [facility] [address], [place], [place] [address] [phone]

## 2020-03-15 NOTE — Telephone Encounter (Signed)
MyChart message sent to pt to find out if they have enough medication to get them through until next appt.   

## 2020-03-16 ENCOUNTER — Encounter: Payer: Self-pay | Admitting: Adult Health

## 2020-03-16 ENCOUNTER — Ambulatory Visit (INDEPENDENT_AMBULATORY_CARE_PROVIDER_SITE_OTHER): Payer: Medicare Other | Admitting: Adult Health

## 2020-03-16 ENCOUNTER — Other Ambulatory Visit: Payer: Self-pay

## 2020-03-16 VITALS — BP 126/82 | HR 78 | Ht 59.0 in | Wt 220.8 lb

## 2020-03-16 DIAGNOSIS — Z9989 Dependence on other enabling machines and devices: Secondary | ICD-10-CM

## 2020-03-16 DIAGNOSIS — G4733 Obstructive sleep apnea (adult) (pediatric): Secondary | ICD-10-CM

## 2020-03-16 NOTE — Telephone Encounter (Signed)
Pt called stating that she is out of her Ozempic and needs a refill, please give pt a call.

## 2020-03-16 NOTE — Telephone Encounter (Signed)
Contacted pt and confirmed that she is out of medication. Pt is completely out and due for a dose. Refill form given to MD for approval/denial.

## 2020-03-16 NOTE — Patient Instructions (Signed)
Your Plan:  Continue using CPAP nightly and greater than 4 hours each night If your symptoms worsen or you develop new symptoms please let us know.       Thank you for coming to see us at [facility]. I hope we have been able to provide you high quality care today.  You may receive a patient satisfaction survey over the next few weeks. We would appreciate your feedback and comments so that we may continue to improve ourselves and the health of our patients.  

## 2020-03-16 NOTE — Telephone Encounter (Signed)
Dr Beasley pt 

## 2020-03-17 ENCOUNTER — Other Ambulatory Visit (INDEPENDENT_AMBULATORY_CARE_PROVIDER_SITE_OTHER): Payer: Self-pay | Admitting: Family Medicine

## 2020-03-17 DIAGNOSIS — F3289 Other specified depressive episodes: Secondary | ICD-10-CM

## 2020-03-22 ENCOUNTER — Ambulatory Visit (INDEPENDENT_AMBULATORY_CARE_PROVIDER_SITE_OTHER): Payer: Medicare Other | Admitting: Family Medicine

## 2020-03-22 ENCOUNTER — Other Ambulatory Visit: Payer: Self-pay

## 2020-03-22 ENCOUNTER — Encounter (INDEPENDENT_AMBULATORY_CARE_PROVIDER_SITE_OTHER): Payer: Self-pay | Admitting: Family Medicine

## 2020-03-22 VITALS — BP 102/59 | HR 80 | Temp 97.7°F | Ht 59.0 in | Wt 218.0 lb

## 2020-03-22 DIAGNOSIS — I10 Essential (primary) hypertension: Secondary | ICD-10-CM

## 2020-03-22 DIAGNOSIS — Z6841 Body Mass Index (BMI) 40.0 and over, adult: Secondary | ICD-10-CM | POA: Diagnosis not present

## 2020-03-22 NOTE — Progress Notes (Signed)
es

## 2020-03-29 NOTE — Progress Notes (Signed)
Chief Complaint:   OBESITY [name] is here to discuss her progress with her obesity treatment plan along with follow-up of her obesity related diagnoses. [name] is on the Category 2 Plan and states she is following her eating plan approximately 75% of the time. [name] states she is walking and couch cardio for 20 minutes 3 times per week.  Today's visit was #: 101 Starting weight: 225 lbs Starting date: 11/20/2017 Today's weight: 218 lbs Today's date: 03/22/2020 Total lbs lost to date: 7 Total lbs lost since last in-office visit: 1  Interim History: [name] has done very well minimizing weight gain over Thanksgiving. She is still down 1 lb. She did well with portion control and smarter choices, and she is ready to get back on track.  Subjective:   1. Essential hypertension [name]'s blood pressure is low today. She denies feeling lightheaded or dizzy. She is on multiple anti-hypertensives.  Assessment/Plan:   1. Essential hypertension [name] is working on healthy weight loss and exercise to improve blood pressure control. We will watch for signs of hypotension as she continues her lifestyle modifications. [name] was encouraged to continue to check her blood pressure at home, and increase her water. We will continue to monitor.  2. Class 3 severe obesity with serious comorbidity and body mass index (BMI) of 40.0 to 44.9 in adult, unspecified obesity type (HCC) [name] is currently in the action stage of change. As such, her goal is to continue with weight loss efforts. She has agreed to the Category 2 Plan.   Exercise goals: Increase exercise to 5 times per week.  Behavioral modification strategies: meal planning and cooking strategies.  [name] has agreed to follow-up with our clinic in 2 to 3 weeks. She was informed of the importance of frequent follow-up visits to maximize her success with intensive lifestyle modifications for her multiple health conditions.   Objective:    Blood pressure (!) 102/59, pulse 80, temperature 97.7 F (36.5 C), height 4\' 11"  (1.499 m), weight 218 lb (98.9 kg), SpO2 97 %. Body mass index is 44.03 kg/m.  General: Cooperative, alert, well developed, in no acute distress. HEENT: Conjunctivae and lids unremarkable. Cardiovascular: Regular rhythm.  Lungs: Normal work of breathing. Neurologic: No focal deficits.   Lab Results  Component Value Date   CREATININE 0.94 02/03/2020   BUN 14 02/03/2020   NA 143 02/03/2020   K 4.1 02/03/2020   CL 102 02/03/2020   CO2 29 02/03/2020   Lab Results  Component Value Date   ALT 31 02/03/2020   AST 17 02/03/2020   ALKPHOS 60 02/03/2020   BILITOT 0.3 02/03/2020   Lab Results  Component Value Date   HGBA1C 7.6 (H) 02/03/2020   HGBA1C 7.5 (H) 10/07/2019   HGBA1C 6.3 (H) 12/26/2018   HGBA1C 6.6 (H) 05/22/2018   HGBA1C 6.8 (H) 03/04/2018   Lab Results  Component Value Date   INSULIN 44.4 (H) 02/03/2020   INSULIN 28.1 (H) 12/26/2018   INSULIN 20.2 03/04/2018   INSULIN 53.9 (H) 11/20/2017   Lab Results  Component Value Date   TSH 0.458 03/05/2019   Lab Results  Component Value Date   CHOL 147 02/03/2020   HDL 47 02/03/2020   LDLCALC 58 02/03/2020   TRIG 266 (H) 02/03/2020   CHOLHDL 4.2 04/23/2011   CHOLHDL 4.3 04/23/2011   Lab Results  Component Value Date   WBC 5.1 01/20/2020   HGB 12.8 01/20/2020   HCT 39.9 01/20/2020   MCV 88.5  01/20/2020   PLT 178 01/20/2020   No results found for: IRON, TIBC, FERRITIN  Attestation Statements:   Reviewed by clinician on day of visit: allergies, medications, problem list, medical history, surgical history, family history, social history, and previous encounter notes.  Time spent on visit including pre-visit chart review and post-visit care and charting was 21 minutes.    I, Trixie Dredge, am acting as transcriptionist for Dennard Nip, MD.  I have reviewed the above documentation for accuracy and completeness, and I agree  with the above. -  Dennard Nip, MD

## 2020-04-12 ENCOUNTER — Encounter (INDEPENDENT_AMBULATORY_CARE_PROVIDER_SITE_OTHER): Payer: Self-pay | Admitting: Family Medicine

## 2020-04-12 ENCOUNTER — Other Ambulatory Visit: Payer: Self-pay

## 2020-04-12 ENCOUNTER — Ambulatory Visit (INDEPENDENT_AMBULATORY_CARE_PROVIDER_SITE_OTHER): Payer: Medicare Other | Admitting: Family Medicine

## 2020-04-12 VITALS — BP 135/78 | HR 73 | Temp 98.1°F | Ht 59.0 in | Wt 215.0 lb

## 2020-04-12 DIAGNOSIS — Z6841 Body Mass Index (BMI) 40.0 and over, adult: Secondary | ICD-10-CM

## 2020-04-12 DIAGNOSIS — Z9189 Other specified personal risk factors, not elsewhere classified: Secondary | ICD-10-CM | POA: Diagnosis not present

## 2020-04-12 DIAGNOSIS — F3289 Other specified depressive episodes: Secondary | ICD-10-CM

## 2020-04-12 DIAGNOSIS — E1169 Type 2 diabetes mellitus with other specified complication: Secondary | ICD-10-CM | POA: Diagnosis not present

## 2020-04-12 MED ORDER — OZEMPIC (0.25 OR 0.5 MG/DOSE) 2 MG/1.5ML ~~LOC~~ SOPN: 0.5000 mg | PEN_INJECTOR | SUBCUTANEOUS | 0 refills | Status: DC

## 2020-04-12 MED ORDER — TOPIRAMATE 25 MG PO TABS: 25.0000 mg | ORAL_TABLET | Freq: Every day | ORAL | 0 refills | Status: DC

## 2020-04-13 ENCOUNTER — Ambulatory Visit (INDEPENDENT_AMBULATORY_CARE_PROVIDER_SITE_OTHER): Payer: Medicare Other | Admitting: Family Medicine

## 2020-04-13 NOTE — Progress Notes (Signed)
Chief Complaint:   OBESITY [name] is here to discuss her progress with her obesity treatment plan along with follow-up of her obesity related diagnoses. [name] is on the Category 2 Plan and states she is following her eating plan approximately 80% of the time. [name] states she is doing water aerobics 30 minutes 3 times per week.  Today's visit was #: 94 Starting weight: 225 lbs Starting date: 11/20/2017 Today's weight: 215 lbs Today's date: 04/12/2020 Total lbs lost to date: 10 lbs Total lbs lost since last in-office visit: 3 lbs Total weight loss percentage to date: -4.44%  Interim History: This [name]'s first Ov with me, as she has been seen by other colleagues prior. Her last Ov was with Dr. Owens Shark on 03/22/2020. There were no medication or meal plan changes at that time.  Assessment/Plan:   1. Type 2 diabetes mellitus with other specified complication, without long-term current use of insulin (HCC) [name] is prescribed Ozempic. She reports fasting blood sugar at its highest was 148 and lowest was 89.  Medications reviewed. Diabetic ROS: no polyuria or polydipsia, no chest pain, dyspnea or TIA's, no numbness, tingling or pain in extremities.   Lab Results  Component Value Date   HGBA1C 7.6 (H) 02/03/2020   HGBA1C 7.5 (H) 10/07/2019   HGBA1C 6.3 (H) 12/26/2018   Lab Results  Component Value Date   MICROALBUR 0.51 03/01/2010   LDLCALC 58 02/03/2020   CREATININE 0.94 02/03/2020   Lab Results  Component Value Date   INSULIN 44.4 (H) 02/03/2020   INSULIN 28.1 (H) 12/26/2018   INSULIN 20.2 03/04/2018   INSULIN 53.9 (H) 11/20/2017   Plan: Refill Ozempic for 1 month, as per below. Good blood sugar control is important to decrease the likelihood of diabetic complications such as nephropathy, neuropathy, limb loss, blindness, coronary artery disease, and death. Intensive lifestyle modification including diet, exercise and weight loss are the first line of treatment for  diabetes.   Refill- Semaglutide,0.25 or 0.5MG /DOS, (OZEMPIC, 0.25 OR 0.5 MG/DOSE,) 2 MG/1.5ML SOPN; Inject 0.5 mg into the skin once a week. DX:E11.69  Dispense: 1.5 mL; Refill: 0  2. Other depression with emotional eating [name] does not eat the foods on the plan and with increased stress, she has an increased desire to eat sugary foods like cake and cookies. [name] is struggling with emotional eating and using food for comfort to the extent that it is negatively impacting her health. She has been working on behavior modification techniques to help reduce her emotional eating. She shows no sign of suicidal or homicidal ideations.  Plan: Behavior modification techniques were discussed today to help [name] deal with her emotional/non-hunger eating behaviors.  Orders and follow up as documented in patient record.   Refill- topiramate (TOPAMAX) 25 MG tablet; Take 1 tablet (25 mg total) by mouth at bedtime.  Dispense: 30 tablet; Refill: 0  3. At risk for impaired metabolic function [name] was given approximately 15 minutes of impaired  metabolic function prevention counseling today. We discussed intensive lifestyle modifications today with an emphasis on specific nutrition and exercise instructions and strategies.   4. Class 3 severe obesity with serious comorbidity and body mass index (BMI) of 40.0 to 44.9 in adult, unspecified obesity type (HCC) [name] is currently in the action stage of change. As such, her goal is to continue with weight loss efforts. She has agreed to the Category 2 Plan.   Exercise goals: Increase to 5 days a week, instead of just 3.  For substantial health benefits, adults should do at least 150 minutes (2 hours and 30 minutes) a week of moderate-intensity, or 75 minutes (1 hour and 15 minutes) a week of vigorous-intensity aerobic physical activity, or an equivalent combination of moderate- and vigorous-intensity aerobic activity. Aerobic activity should be performed in episodes  of at least 10 minutes, and preferably, it should be spread throughout the week.  Behavioral modification strategies: meal planning and cooking strategies, holiday eating strategies  and planning for success.  [name] has agreed to follow-up with our clinic in 2-2.5 weeks. She was informed of the importance of frequent follow-up visits to maximize her success with intensive lifestyle modifications for her multiple health conditions.   Objective:   Blood pressure 135/78, pulse 73, temperature 98.1 F (36.7 C), height 4\' 11"  (1.499 m), weight 215 lb (97.5 kg), SpO2 97 %. Body mass index is 43.42 kg/m.  General: Cooperative, alert, well developed, in no acute distress. HEENT: Conjunctivae and lids unremarkable. Cardiovascular: Regular rhythm.  Lungs: Normal work of breathing. Neurologic: No focal deficits.   Lab Results  Component Value Date   CREATININE 0.94 02/03/2020   BUN 14 02/03/2020   NA 143 02/03/2020   K 4.1 02/03/2020   CL 102 02/03/2020   CO2 29 02/03/2020   Lab Results  Component Value Date   ALT 31 02/03/2020   AST 17 02/03/2020   ALKPHOS 60 02/03/2020   BILITOT 0.3 02/03/2020   Lab Results  Component Value Date   HGBA1C 7.6 (H) 02/03/2020   HGBA1C 7.5 (H) 10/07/2019   HGBA1C 6.3 (H) 12/26/2018   HGBA1C 6.6 (H) 05/22/2018   HGBA1C 6.8 (H) 03/04/2018   Lab Results  Component Value Date   INSULIN 44.4 (H) 02/03/2020   INSULIN 28.1 (H) 12/26/2018   INSULIN 20.2 03/04/2018   INSULIN 53.9 (H) 11/20/2017   Lab Results  Component Value Date   TSH 0.458 03/05/2019   Lab Results  Component Value Date   CHOL 147 02/03/2020   HDL 47 02/03/2020   LDLCALC 58 02/03/2020   TRIG 266 (H) 02/03/2020   CHOLHDL 4.2 04/23/2011   CHOLHDL 4.3 04/23/2011   Lab Results  Component Value Date   WBC 5.1 01/20/2020   HGB 12.8 01/20/2020   HCT 39.9 01/20/2020   MCV 88.5 01/20/2020   PLT 178 01/20/2020   Attestation Statements:   Reviewed by clinician on day of  visit: allergies, medications, problem list, medical history, surgical history, family history, social history, and previous encounter notes.  Coral Ceo, am acting as Location manager for [employer], DO.  I have reviewed the above documentation for accuracy and completeness, and I agree with the above. Marjory Sneddon, D.O.  The [place] was signed into law in 2016 which includes the topic of electronic health records.  This provides immediate access to information in MyChart.  This includes consultation notes, operative notes, office notes, lab results and pathology reports.  If you have any questions about what you read please let us know at your next visit so we can discuss your concerns and take corrective action if need be.  We are right here with you.

## 2020-04-15 IMAGING — DX DG HIP (WITH OR WITHOUT PELVIS) 1V PORT*L*
2 series · 2 of 2 positions shown · non-contrast
Comparison: 04/28/2018

CLINICAL DATA: S/p left hip replacement surgery for arthritis

EXAM:
DG HIP (WITH OR WITHOUT PELVIS) 1V PORT LEFT

[pelvis ap]
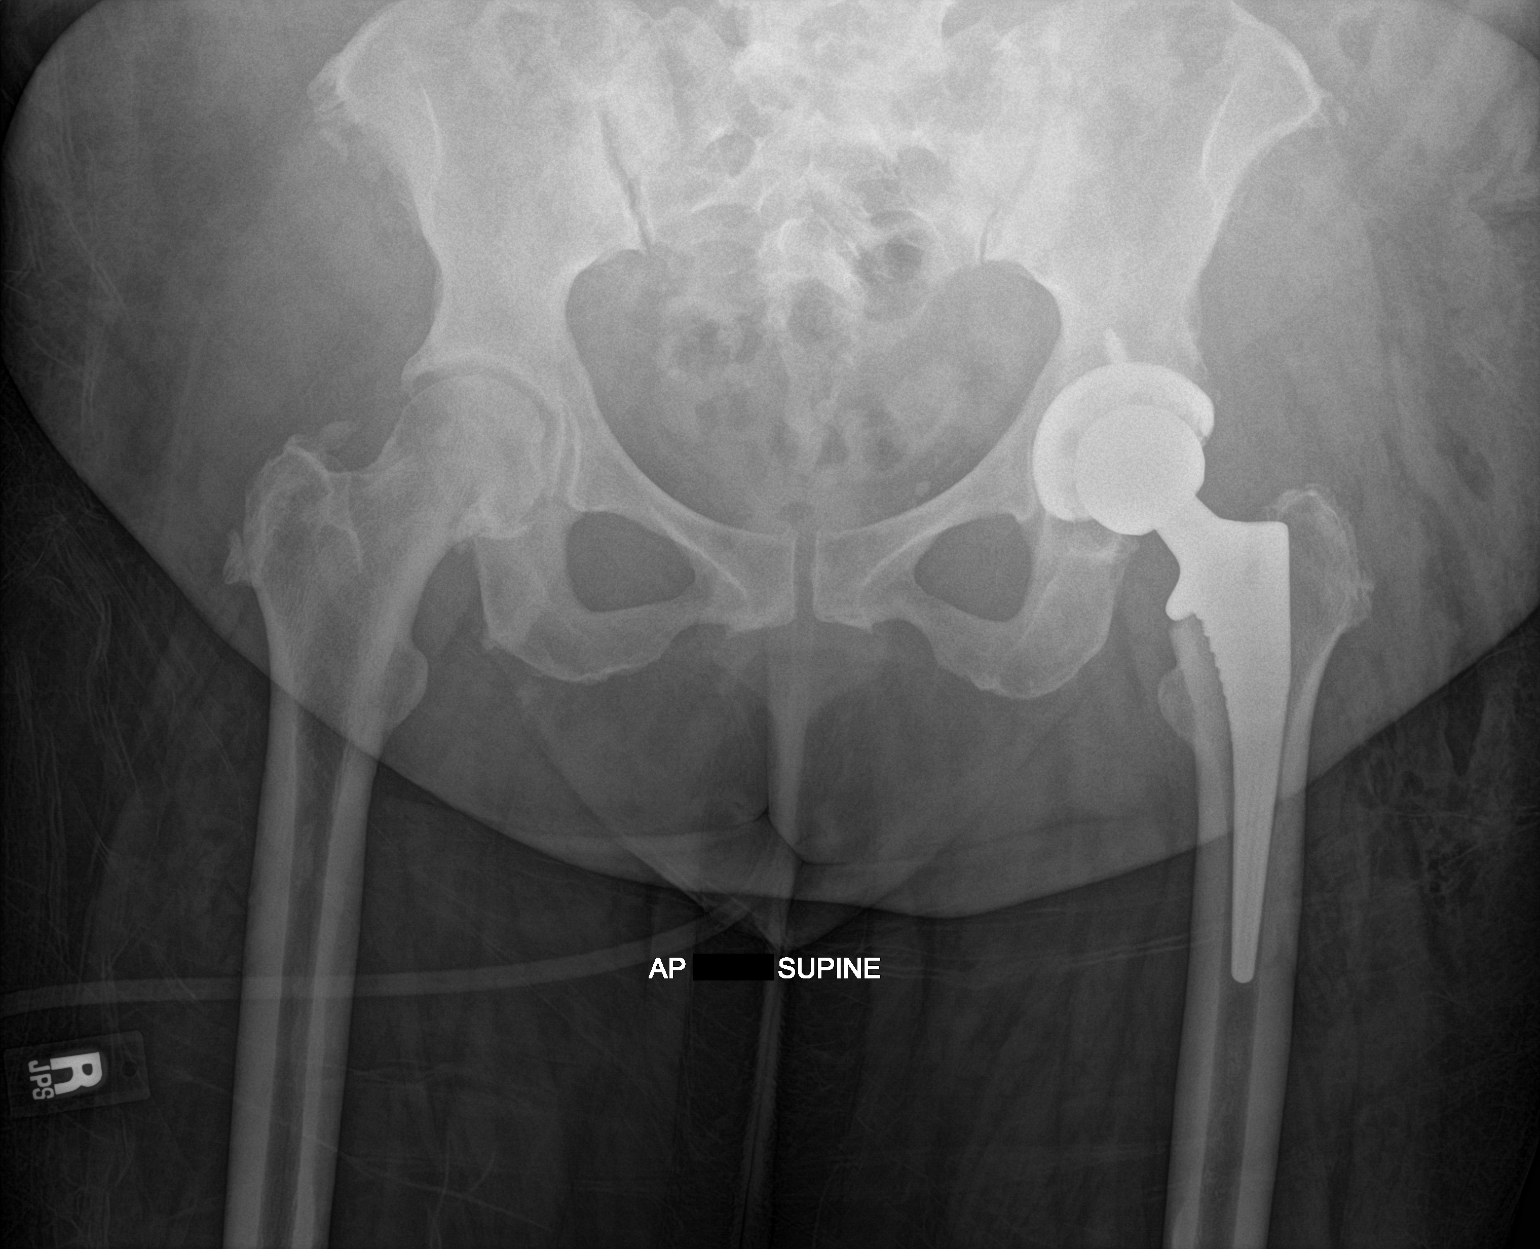

[hip lat]
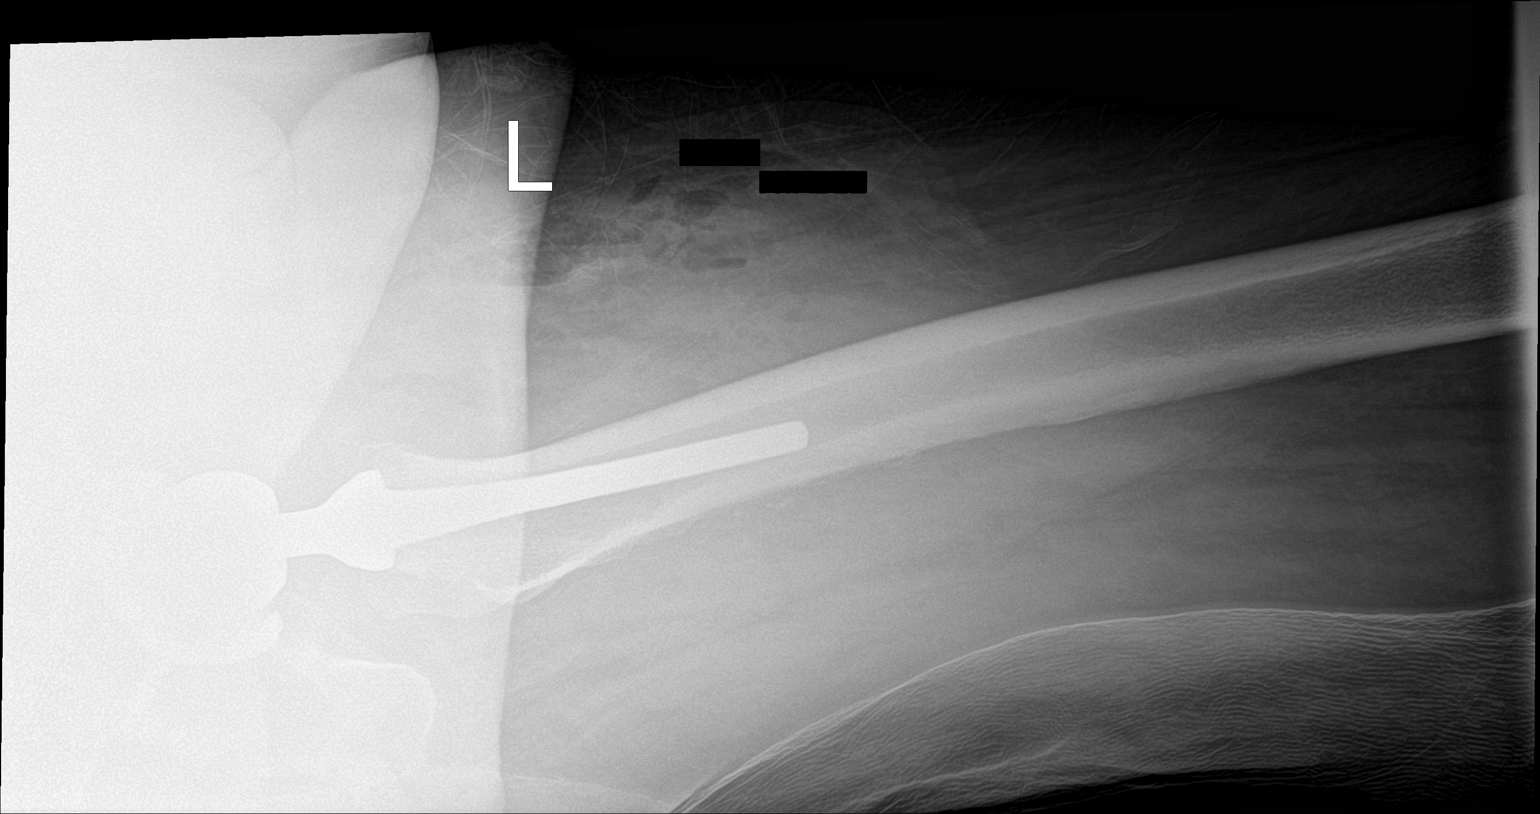

[2 of 2 positions shown; findings below may reference images not displayed]

FINDINGS: Patient has undergone interval LEFT total hip arthroplasty. The
components are well-seated and there is no dislocation. Expected
soft tissue gas. Degenerative changes are present in the RIGHT hip
and the SI joints.
IMPRESSION: Uncomplicated new LEFT total hip arthroplasty.

## 2020-04-15 IMAGING — RF DG HIP (WITH PELVIS) OPERATIVE*L*
1 series · 2 of 2 positions shown · non-contrast
Comparison: 04/28/2018

CLINICAL DATA: Left anterior hip Fluoro time 2 min .05 seconds

EXAM:
OPERATIVE LEFT HIP (WITH PELVIS IF PERFORMED) 2 VIEWS
TECHNIQUE: Fluoroscopic spot image(s) were submitted for interpretation
post-operatively.

[Series 1: unknown protocol · 0.20mm/px · 2 of 2 slices shown]
[im 1/2]
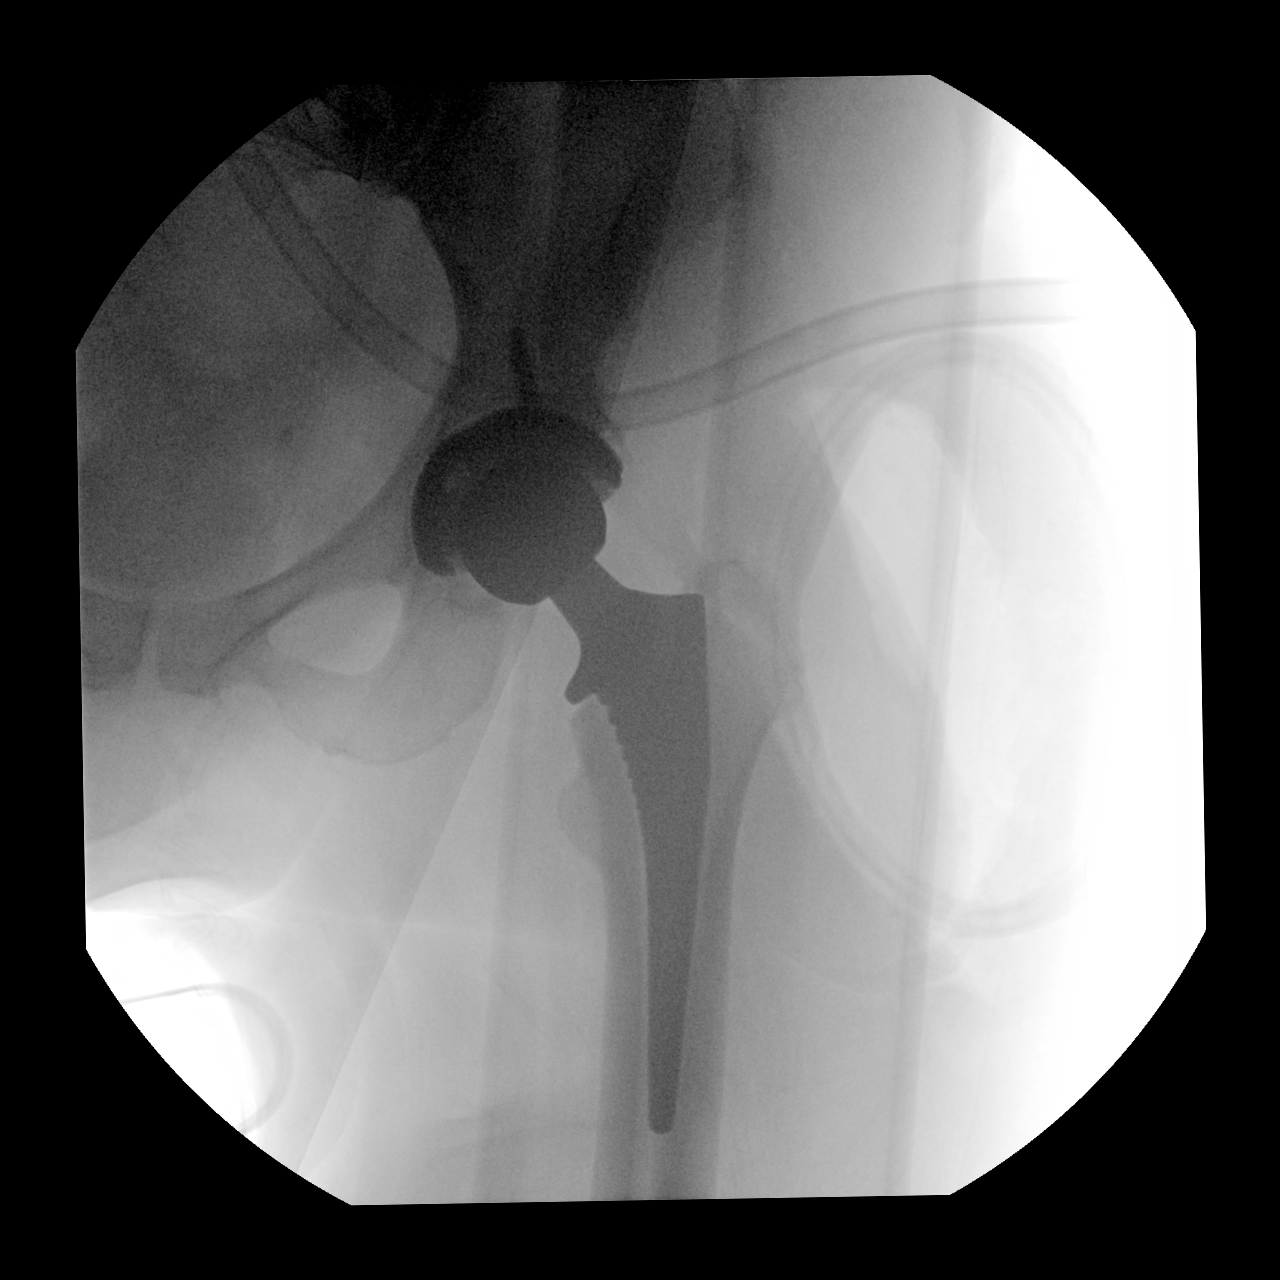
[im 2/2]
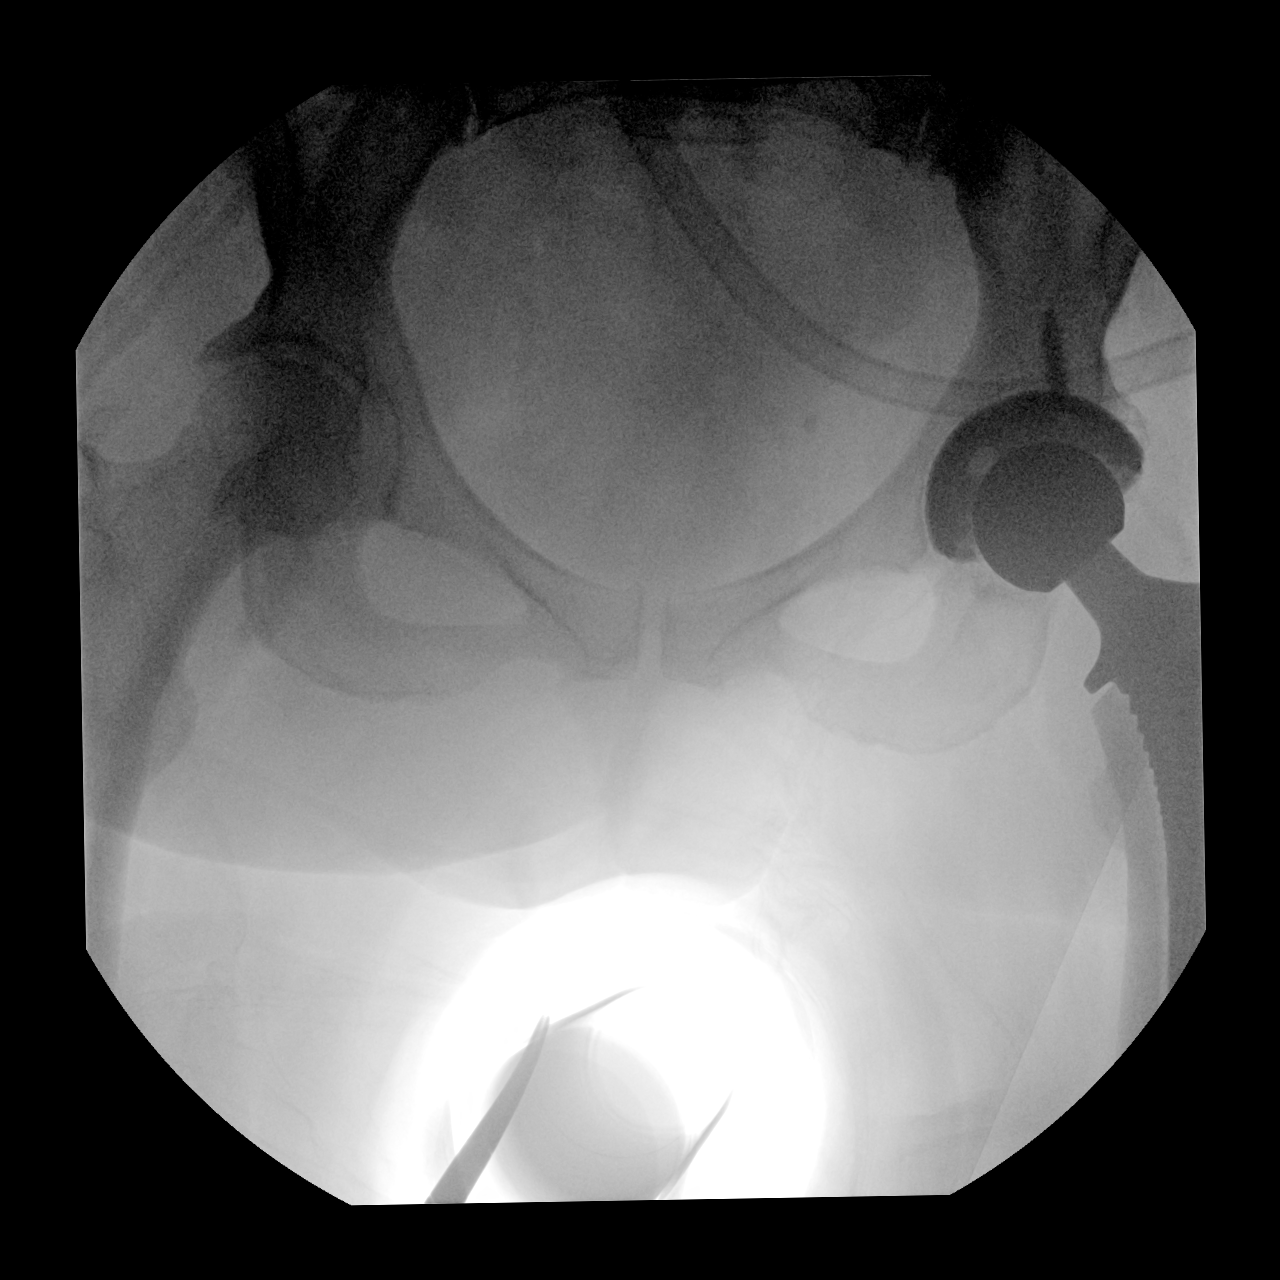

[2 of 2 positions shown; findings below may reference images not displayed]

FINDINGS: Frontal images demonstrate interval LEFT hip total arthroplasty. The
femoral head component is well seated in the acetabular component on
the frontal views provided. Degenerative changes are seen in the
RIGHT hip. No interval fracture.
IMPRESSION: Interval LEFT hip total arthroplasty.

## 2020-05-05 ENCOUNTER — Ambulatory Visit (INDEPENDENT_AMBULATORY_CARE_PROVIDER_SITE_OTHER): Payer: Medicare Other | Admitting: Family Medicine

## 2020-05-06 ENCOUNTER — Other Ambulatory Visit (INDEPENDENT_AMBULATORY_CARE_PROVIDER_SITE_OTHER): Payer: Self-pay | Admitting: Family Medicine

## 2020-05-06 DIAGNOSIS — E1169 Type 2 diabetes mellitus with other specified complication: Secondary | ICD-10-CM

## 2020-05-17 ENCOUNTER — Other Ambulatory Visit: Payer: Self-pay

## 2020-05-17 DIAGNOSIS — E041 Nontoxic single thyroid nodule: Secondary | ICD-10-CM

## 2020-05-19 ENCOUNTER — Emergency Department (HOSPITAL_BASED_OUTPATIENT_CLINIC_OR_DEPARTMENT_OTHER): Payer: Medicare Other

## 2020-05-19 ENCOUNTER — Emergency Department (HOSPITAL_COMMUNITY): Payer: Medicare Other

## 2020-05-19 ENCOUNTER — Encounter (HOSPITAL_COMMUNITY): Payer: Self-pay | Admitting: Emergency Medicine

## 2020-05-19 ENCOUNTER — Other Ambulatory Visit: Payer: Self-pay

## 2020-05-19 ENCOUNTER — Emergency Department (HOSPITAL_COMMUNITY)
Admission: EM | Admit: 2020-05-19 | Discharge: 2020-05-19 | Disposition: A | Discharge status: Home / Self Care | Payer: Medicare Other | Attending: Emergency Medicine | Admitting: Emergency Medicine

## 2020-05-19 DIAGNOSIS — Z91040 Latex allergy status: Secondary | ICD-10-CM | POA: Insufficient documentation

## 2020-05-19 DIAGNOSIS — R042 Hemoptysis: Secondary | ICD-10-CM | POA: Diagnosis not present

## 2020-05-19 DIAGNOSIS — I110 Hypertensive heart disease with heart failure: Secondary | ICD-10-CM | POA: Diagnosis not present

## 2020-05-19 DIAGNOSIS — R0602 Shortness of breath: Secondary | ICD-10-CM | POA: Diagnosis not present

## 2020-05-19 DIAGNOSIS — J45909 Unspecified asthma, uncomplicated: Secondary | ICD-10-CM | POA: Insufficient documentation

## 2020-05-19 DIAGNOSIS — Z96642 Presence of left artificial hip joint: Secondary | ICD-10-CM | POA: Insufficient documentation

## 2020-05-19 DIAGNOSIS — Z87891 Personal history of nicotine dependence: Secondary | ICD-10-CM | POA: Insufficient documentation

## 2020-05-19 DIAGNOSIS — I5030 Unspecified diastolic (congestive) heart failure: Secondary | ICD-10-CM | POA: Insufficient documentation

## 2020-05-19 DIAGNOSIS — Z85830 Personal history of malignant neoplasm of bone: Secondary | ICD-10-CM | POA: Diagnosis not present

## 2020-05-19 DIAGNOSIS — M7989 Other specified soft tissue disorders: Secondary | ICD-10-CM | POA: Diagnosis not present

## 2020-05-19 DIAGNOSIS — R059 Cough, unspecified: Secondary | ICD-10-CM

## 2020-05-19 DIAGNOSIS — Z20822 Contact with and (suspected) exposure to covid-19: Secondary | ICD-10-CM | POA: Insufficient documentation

## 2020-05-19 DIAGNOSIS — Z86711 Personal history of pulmonary embolism: Secondary | ICD-10-CM

## 2020-05-19 DIAGNOSIS — E119 Type 2 diabetes mellitus without complications: Secondary | ICD-10-CM | POA: Diagnosis not present

## 2020-05-19 DIAGNOSIS — Z96652 Presence of left artificial knee joint: Secondary | ICD-10-CM | POA: Insufficient documentation

## 2020-05-19 LAB — LIPASE, BLOOD: Lipase: 49 U/L (ref 11–51)

## 2020-05-19 LAB — BASIC METABOLIC PANEL
Anion gap: 9 (ref 5–15)
BUN: 15 mg/dL (ref 6–20)
CO2: 27 mmol/L (ref 22–32)
Calcium: 9.3 mg/dL (ref 8.9–10.3)
Chloride: 103 mmol/L (ref 98–111)
Creatinine, Ser: 0.98 mg/dL (ref 0.44–1.00)
GFR, Estimated: >60 mL/min (ref >60)
Glucose, Bld: 140 mg/dL — ABNORMAL HIGH (ref 70–99)
Potassium: 3.9 mmol/L (ref 3.5–5.1)
Sodium: 139 mmol/L (ref 135–145)

## 2020-05-19 LAB — HEPATIC FUNCTION PANEL
ALT: 24 U/L (ref 0–44)
AST: 18 U/L (ref 15–41)
Albumin: 3.5 g/dL (ref 3.5–5.0)
Alkaline Phosphatase: 57 U/L (ref 38–126)
Bilirubin, Direct: <0.1 mg/dL (ref 0.0–0.2)
Total Bilirubin: 0.4 mg/dL (ref 0.3–1.2)
Total Protein: 6.7 g/dL (ref 6.5–8.1)

## 2020-05-19 LAB — CBC
HCT: 41.6 % (ref 36.0–46.0)
Hemoglobin: 13.2 g/dL (ref 12.0–15.0)
MCH: 27.8 pg (ref 26.0–34.0)
MCHC: 31.7 g/dL (ref 30.0–36.0)
MCV: 87.8 fL (ref 80.0–100.0)
Platelets: 234 10*3/uL (ref 150–400)
RBC: 4.74 MIL/uL (ref 3.87–5.11)
RDW: 13.1 % (ref 11.5–15.5)
WBC: 5.5 10*3/uL (ref 4.0–10.5)
nRBC: 0 % (ref 0.0–0.2)

## 2020-05-19 LAB — TROPONIN I (HIGH SENSITIVITY)
Troponin I (High Sensitivity): 10 ng/L (ref <18)
Troponin I (High Sensitivity): 10 ng/L (ref <18)

## 2020-05-19 LAB — BRAIN NATRIURETIC PEPTIDE: B Natriuretic Peptide: 34.4 pg/mL (ref 0.0–100.0)

## 2020-05-19 MED ORDER — ALBUTEROL SULFATE HFA 108 (90 BASE) MCG/ACT IN AERS
2.0000 | INHALATION_SPRAY | Freq: Once | RESPIRATORY_TRACT | Status: AC
  Administered 2020-05-19: 2 via RESPIRATORY_TRACT
  Filled 2020-05-19: qty 6.7

## 2020-05-19 MED ORDER — IOHEXOL 350 MG/ML SOLN
75.0000 mL | Freq: Once | INTRAVENOUS | Status: AC | PRN
  Administered 2020-05-19: 75 mL via INTRAVENOUS

## 2020-05-19 MED ORDER — FENTANYL CITRATE (PF) 100 MCG/2ML IJ SOLN
50.0000 ug | Freq: Once | INTRAMUSCULAR | Status: AC
Start: 2020-05-19 — End: 2020-05-19
  Administered 2020-05-19: 50 ug via INTRAVENOUS
  Filled 2020-05-19: qty 2

## 2020-05-19 NOTE — ED Notes (Signed)
Went in pt room to ambulate her and check pulse ox. Pt noted to be at 89% on room air with good waveform. Pt placed on 2L [place], SpO2 came to 94%. RN aware. Did not ambulate at this time.

## 2020-05-19 NOTE — Progress Notes (Signed)
Lower extremity venous LT study completed.  Preliminary results relayed to Long, MD.   See CV Proc for preliminary results report.   Darlin Coco, RDMS

## 2020-05-19 NOTE — ED Triage Notes (Addendum)
Patient states she woke this morning with chest pain and coughing up blood, also reports generalized weakness. States all symptoms started this morning when she woke up. Reports history of PE and states this feels very similar.

## 2020-05-19 NOTE — ED Provider Notes (Signed)
Emergency Department Provider Note   I have reviewed the triage vital signs and the nursing notes.   HISTORY  Chief Complaint Cough   HPI [name] is a 60 y.o. female with past medical history of PE (not anticoagulated) and dCHF (EF 60%) presents to the emergency department with cough, shortness of breath, and some hemoptysis this morning.  She states yesterday she was feeling fatigue but no shortness of breath symptoms.  Today she developed a cough with some shortness of breath worse with exertion.  She is not experiencing chest pain.  She noticed with coughing up some sputum this morning she had some blood-tinged to the sputum.  She did not use her albuterol inhaler.  She denies abdominal pain with no vomiting or diarrhea.  She did contract Covid in late September 2021 but has not had significant symptoms since.  She states that she was anticoagulated after her prior PE but was told she could discontinue this and has been taking an aspirin. Notes symptoms are somewhat similar. Also notes swelling to the left foot.    Past Medical History:  Diagnosis Date  . Anginal pain ([place])    a. NL cath in 2008;  b. Myoview 03/2011: dec uptake along mid anterior wall on stress imaging -> ? attenuation vs. ischemia, EF 65%;  c. Echo 04/2011: EF 55-60%, no RWMA, Gr 2 dd  . Anxiety   . Arthritis   . Asthma   . Back pain   . Bone cancer ([place])   . Cancer ([place])   . Chest pain   . CHF (congestive heart failure) ([place])   . CHF (congestive heart failure) ([place])   . Depression   . Diabetes mellitus   . Drug use   . Dyspnea    with exertion  . Dyspnea   . Frequent urination   . GERD (gastroesophageal reflux disease)   . Glaucoma   . HLD (hyperlipidemia)   . Hypertension   . IBS (irritable bowel syndrome)   . Joint pain   . Lactose intolerance   . Leg edema   . 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 Obesity   . Palpitations   . Pneumonia 05/2016   double  .  Pulmonary edema   . Pulmonary embolism ([place])    a. 2008 -> coumadin x 6 mos.  . Rheumatoid arthritis ([place])   . Sleep apnea    on CPAP 02/2018  . TIA (transient ischemic attack)   . Urinary urgency     Patient Active Problem List   Diagnosis Date Noted  . At risk for impaired metabolic function 22/05/5425  . Unilateral primary osteoarthritis, left hip   . Hip arthritis 06/11/2018  . Class 3 severe obesity with serious comorbidity and body mass index (BMI) of 40.0 to 44.9 in adult ([place]) 05/29/2018  . Other fatigue 11/20/2017  . Shortness of breath on exertion 11/20/2017  . Diabetes mellitus ([place]) 11/20/2017  . Vitamin D deficiency 11/20/2017  . Depression 11/20/2017  . Other hyperlipidemia 11/20/2017  . 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 Presence of left artificial knee joint 09/20/2016  . Arthritis of knee 08/22/2016  . Cervical radiculopathy 07/26/2016  . Cervical disc disorder with radiculopathy 07/26/2016  . Right arm pain 06/29/2016  . Cervicalgia 06/29/2016  . Primary osteoarthritis of left knee 06/29/2016  . Hypersomnia with sleep apnea 11/18/2015  . Lethargy 11/18/2015  . Chest pain 09/30/2014  . Abnormal  cardiac function test 09/29/2014  . Chest pain with moderate risk for cardiac etiology 09/28/2014  . HTN (hypertension) 09/28/2014  . Hypokalemia   . Paresthesia 06/18/2014  . Numbness and tingling of left arm and leg   . Unstable angina ([facility]) 05/24/2014  . OSA on CPAP 05/24/2014  . S/P laparoscopic appendectomy 06/04/2012  . Diastolic CHF ([facility]) 01/09/2012  . Mediastinal mass 01/09/2012  . SOB (shortness of breath) 06/20/2011  . Mediastinal abnormality 06/20/2011  . Dyslipidemia 12/23/2009  . GLAUCOMA 12/23/2009  . ARTHRITIS 12/23/2009  . 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 Class 2 severe obesity with serious comorbidity and body mass index (BMI) of 38.0 to 38.9 in adult ([facility]) 09/13/2006  . ANXIETY STATE NOS 09/13/2006  . DISORDER, DEPRESSIVE NEC 09/13/2006   . CARPAL TUNNEL SYNDROME, MILD 09/13/2006  . Unspecified essential hypertension 09/13/2006  . IBS 09/13/2006  . DEGENERATION, LUMBAR/LUMBOSACRAL DISC 09/13/2006  . SYMPTOM, SWELLING/MASS/LUMP IN CHEST 09/13/2006  . PULMONARY EMBOLISM, HX OF 09/13/2006    Past Surgical History:  Procedure Laterality Date  . ABDOMINAL HYSTERECTOMY  2005  . APPENDECTOMY    . CARDIAC CATHETERIZATION     Normal  . CARDIAC CATHETERIZATION N/A 09/30/2014   Procedure: Left Heart Cath and Coronary Angiography;  Surgeon: Tonny Bollman, MD;  Location: [facility] INVASIVE CV LAB;  Service: Cardiovascular;  Laterality: N/A;  . LAPAROSCOPIC APPENDECTOMY N/A 06/03/2012   Procedure: APPENDECTOMY LAPAROSCOPIC;  Surgeon: Almond Lint, MD;  Location: [facility] OR;  Service: General;  Laterality: N/A;  . Left knee surgery  2008  . LEG SURGERY    . TONSILLECTOMY    . TOTAL HIP ARTHROPLASTY Left 06/11/2018   Procedure: LEFT TOTAL HIP ARTHROPLASTY ANTERIOR APPROACH;  Surgeon: Cammy Copa, MD;  Location: [facility] OR;  Service: Orthopedics;  Laterality: Left;  . TOTAL KNEE ARTHROPLASTY Left 08/22/2016   Procedure: TOTAL KNEE ARTHROPLASTY;  Surgeon: Cammy Copa, MD;  Location: [facility] OR;  Service: Orthopedics;  Laterality: Left;  . TUBAL LIGATION  1989    Allergies Penicillins, Hydrocodone-acetaminophen, Latex, and Sulfa antibiotics  Family History  Problem Relation Age of Onset  . Emphysema Mother   . Arthritis Mother   . Heart failure Mother        alive @ 62  . Stroke Mother   . Diabetes Mother   . Hypertension Mother   . Hyperlipidemia Mother   . Depression Mother   . Anxiety disorder Mother   . Asthma Brother   . Heart disease Father        died @ 61's.  . Stroke Father   . Diabetes Father   . Hyperlipidemia Father   . Hypertension Father   . Bipolar disorder Father   . Sleep apnea Father   . Alcoholism Father   . Drug abuse Father   . Diabetes Sister   . Heart disease Paternal Grandfather   . Colon cancer  Maternal Grandfather   . Breast cancer Maternal Aunt     Social History Social History   Tobacco Use  . Smoking status: Former Smoker    Packs/day: 0.50    Years: 15.00    Pack years: 7.50    Types: Cigarettes    Quit date: 04/24/1985    Years since quitting: 35.0  . Smokeless tobacco: Never Used  Substance Use Topics  . Alcohol use: No    Alcohol/week: 0.0 standard drinks  . Drug use: Not Currently    Types: Marijuana    Review of Systems  Constitutional: No  fever/chills. Positive fatigue.  Eyes: No visual changes. ENT: No sore throat. Cardiovascular: Denies chest pain. Respiratory: Positive shortness of breath with cough and hemoptysis.  Gastrointestinal: Negative abdominal pain.  No nausea, no vomiting.  No diarrhea.  No constipation. Genitourinary: Negative for dysuria. Musculoskeletal: Negative for back pain. Skin: Negative for rash. Neurological: Negative for headaches, focal weakness or numbness.  10-point ROS otherwise negative.  ____________________________________________   PHYSICAL EXAM:  VITAL SIGNS: ED Triage Vitals  Enc Vitals Group     BP 05/19/20 1214 (!) 152/88     Pulse Rate 05/19/20 1214 76     Resp 05/19/20 1214 16     Temp 05/19/20 1214 98.6 F (37 C)     Temp Source 05/19/20 1214 Oral     SpO2 05/19/20 1214 98 %   Constitutional: Alert and oriented. Well appearing and in no acute distress. Eyes: Conjunctivae are normal.  Head: Atraumatic. Nose: No congestion/rhinnorhea. Mouth/Throat: Mucous membranes are moist.   Neck: No stridor.   Cardiovascular: Normal rate, regular rhythm. Good peripheral circulation. Grossly normal heart sounds.   Respiratory: Mild increased respiratory effort.  No retractions. Lungs CTAB w/o wheezing. No rales.  Gastrointestinal: Soft and nontender. No distention.  Musculoskeletal: No lower extremity tenderness with mild swelling to the left foot and lower calf. No erythema. No gross deformities of  extremities. Neurologic:  Normal speech and language.  Skin:  Skin is warm, dry and intact. No rash noted.   ____________________________________________   LABS (all labs ordered are listed, but only abnormal results are displayed)  Labs Reviewed  BASIC METABOLIC PANEL - Abnormal; Notable for the following components:      Result Value   Glucose, Bld 140 (*)    All other components within normal limits  SARS CORONAVIRUS 2 (TAT 6-24 HRS)  CBC  BRAIN NATRIURETIC PEPTIDE  HEPATIC FUNCTION PANEL  LIPASE, BLOOD  POC SARS CORONAVIRUS 2 AG -  ED  TROPONIN I (HIGH SENSITIVITY)  TROPONIN I (HIGH SENSITIVITY)   ____________________________________________  EKG   EKG Interpretation  Date/Time:  Wednesday May 19 2020 12:18:25 EST Ventricular Rate:  74 PR Interval:  190 QRS Duration: 106 QT Interval:  390 QTC Calculation: 432 R Axis:   88 Text Interpretation: Normal sinus rhythm Incomplete left bundle branch block Nonspecific ST and T wave abnormality Abnormal ECG No STEMI. Similar to prior. Confirmed by Nanda Quinton [phone]) on 05/19/2020 4:08:39 PM       ____________________________________________  RADIOLOGY  CT Angio Chest PE W and/or Wo Contrast  Result Date: 05/19/2020 CLINICAL DATA:  60 year old female with chest pain and cough. EXAM: CT ANGIOGRAPHY CHEST WITH CONTRAST TECHNIQUE: Multidetector CT imaging of the chest was performed using the standard protocol during bolus administration of intravenous contrast. Multiplanar CT image reconstructions and MIPs were obtained to evaluate the vascular anatomy. CONTRAST:  25mL OMNIPAQUE IOHEXOL 350 MG/ML SOLN COMPARISON:  09/25/2019 CT and prior studies FINDINGS: Cardiovascular: This is a technically satisfactory study. No pulmonary emboli are identified. The pulmonary arteries are prominent suggesting pulmonary arterial hypertension. Mild cardiomegaly is noted. No thoracic aortic aneurysm or pericardial effusion identified.  Mediastinum/Nodes: No enlarged mediastinal, hilar, or axillary lymph nodes. Thyroid gland, trachea, and esophagus demonstrate no significant findings. Lungs/Pleura: No airspace disease, consolidation, nodule, mass, pleural effusion or pneumothorax. Mild lingular scarring is again noted. Upper Abdomen: No acute abnormality Musculoskeletal: No acute or suspicious bony abnormalities. Review of the MIP images confirms the above findings. IMPRESSION: 1. No evidence of acute abnormality. No evidence  of pulmonary emboli. 2. Prominent pulmonary arteries suggesting pulmonary arterial hypertension. 3. Mild cardiomegaly. Electronically Signed   By: Margarette Canada M.D.   On: 05/19/2020 17:37   VAS [name] LOWER EXTREMITY VENOUS (DVT) ([facility] and [facility] 7a-7p)  Result Date: 05/19/2020  Lower Venous DVT Study Indications: Swelling in LT leg, hx of PE.  Comparison Study: 08-18-2019 Prior RT LEV was negative for DVT. 08-14-2017 LT                   LEV was negative for DVT. Performing Technologist: Darlin Coco RDMS  Examination Guidelines: A complete evaluation includes B-mode imaging, spectral Doppler, color Doppler, and power Doppler as needed of all accessible portions of each vessel. Bilateral testing is considered an integral part of a complete examination. Limited examinations for reoccurring indications may be performed as noted. The reflux portion of the exam is performed with the patient in reverse Trendelenburg.  +-----+---------------+---------+-----------+----------+--------------+ RIGHTCompressibilityPhasicitySpontaneityPropertiesThrombus Aging +-----+---------------+---------+-----------+----------+--------------+ CFV  Full           Yes      Yes                                 +-----+---------------+---------+-----------+----------+--------------+   +---------+---------------+---------+-----------+----------+--------------+ LEFT     CompressibilityPhasicitySpontaneityPropertiesThrombus Aging  +---------+---------------+---------+-----------+----------+--------------+ CFV      Full           Yes      Yes                                 +---------+---------------+---------+-----------+----------+--------------+ SFJ      Full                                                        +---------+---------------+---------+-----------+----------+--------------+ FV Prox  Full                                                        +---------+---------------+---------+-----------+----------+--------------+ FV Mid   Full                                                        +---------+---------------+---------+-----------+----------+--------------+ FV DistalFull                                                        +---------+---------------+---------+-----------+----------+--------------+ PFV      Full                                                        +---------+---------------+---------+-----------+----------+--------------+ POP      Full  Yes      Yes                                 +---------+---------------+---------+-----------+----------+--------------+ PTV      Full                                                        +---------+---------------+---------+-----------+----------+--------------+ PERO     Full                                                        +---------+---------------+---------+-----------+----------+--------------+     Summary: RIGHT: - No evidence of common femoral vein obstruction.  LEFT: - There is no evidence of deep vein thrombosis in the lower extremity.  - No cystic structure found in the popliteal fossa. - Ultrasound characteristics of enlarged lymph nodes noted in the groin.  *See table(s) above for measurements and observations. Electronically signed by Harold Barban MD on 05/19/2020 at 9:36:40 PM.    Final     ____________________________________________   PROCEDURES  Procedure(s) performed:    Procedures  None  ____________________________________________   INITIAL IMPRESSION / ASSESSMENT AND PLAN / ED COURSE  Pertinent labs & imaging results that were available during my care of the patient were reviewed by me and considered in my medical decision making (see chart for details).   Patient presents to the emergency department for evaluation of shortness of breath with hemoptysis and cough.  Chest x-ray with diffuse, bilateral pulmonary infiltrates/edema.  Patient does not appear acutely volume overloaded.  She is having some pain in the left leg with some mild swelling.  Will obtain DVT ultrasound but patient will need further evaluation of possible PE.  She does have a history of this in the past and is not anticoagulated currently.  I have also sent Covid antigen test.   COVID negative. Troponin and BNP are negative. No anemia. No AKI. [name] negative for DVT. CTA without PE or other acute process. Patient briefly required O2 after Fentanyl but then was able to be removed and is feeling better. Stable with ambulation here. Plan for discharge with close PCP follow up plan. Discussed ED return precautions.  ____________________________________________  FINAL CLINICAL IMPRESSION(S) / ED DIAGNOSES  Final diagnoses:  Hemoptysis  SOB (shortness of breath)  Cough     MEDICATIONS GIVEN DURING THIS VISIT:  Medications  albuterol (VENTOLIN HFA) 108 (90 Base) MCG/ACT inhaler 2 puff (2 puffs Inhalation Given 05/19/20 1823)  iohexol (OMNIPAQUE) 350 MG/ML injection 75 mL (75 mLs Intravenous Contrast Given 05/19/20 1721)  fentaNYL (SUBLIMAZE) injection 50 mcg (50 mcg Intravenous Given 05/19/20 1824)     Note:  This document was prepared using Dragon voice recognition software and may include unintentional dictation errors.  Nanda Quinton, MD, [facility]    Clent Damore, Wonda Olds, MD 05/20/20 [phone]

## 2020-05-19 NOTE — Discharge Instructions (Signed)
You were seen in the emergency department today with blood with coughing and shortness of breath.  Your CT scan did not show a blood clot in your lungs.  You may have bronchitis.  We are testing you for COVID-19.  The test results will come back in the MyChart app in the next 6 to 24 hours.  There is information in the discharge paperwork about how to set that up on your phone.   If you develop chest pain, shortness of breath, lightheadedness, other sudden/severe symptoms please return to the emergency department for immediate reevaluation.

## 2020-05-19 NOTE — ED Notes (Signed)
Ambulated pt on pulse ox, pt o2 dropped to 94% on RA. After returning to room pt o2 at 98%.

## 2020-05-19 NOTE — ED Notes (Signed)
Vs is aware

## 2020-05-20 LAB — SARS CORONAVIRUS 2 (TAT 6-24 HRS): SARS Coronavirus 2: NEGATIVE

## 2020-05-27 ENCOUNTER — Ambulatory Visit
Admission: RE | Admit: 2020-05-27 | Discharge: 2020-05-27 | Disposition: A | Discharge status: Home / Self Care | Payer: Medicare Other | Source: Ambulatory Visit | Attending: Family | Admitting: Family

## 2020-05-27 DIAGNOSIS — E041 Nontoxic single thyroid nodule: Secondary | ICD-10-CM

## 2020-05-28 ENCOUNTER — Other Ambulatory Visit (INDEPENDENT_AMBULATORY_CARE_PROVIDER_SITE_OTHER): Payer: Self-pay | Admitting: Family Medicine

## 2020-05-28 DIAGNOSIS — E1169 Type 2 diabetes mellitus with other specified complication: Secondary | ICD-10-CM

## 2020-06-02 ENCOUNTER — Telehealth: Payer: Self-pay | Admitting: Adult Health

## 2020-06-02 NOTE — Telephone Encounter (Signed)
Pt called, I saw my PCP in January because having pain in my cheek bone. Pain comes and goes. PCP told me to I need to have it checked out. I [name]) informed Pt would need a referral from her PCP and would be glad schedule that appt.

## 2020-06-02 NOTE — Telephone Encounter (Signed)
Noted.  New problem.

## 2020-06-04 ENCOUNTER — Encounter (INDEPENDENT_AMBULATORY_CARE_PROVIDER_SITE_OTHER): Payer: Self-pay

## 2020-07-07 IMAGING — DX CHEST - 2 VIEW
2 series · 2 of 2 positions shown · non-contrast
Comparison: 04/27/2018 and earlier.

CLINICAL DATA: 57-year-old female with central chest pain, mild
shortness of breath, pain radiating to the back.

EXAM:
CHEST - 2 VIEW

[chest pa]
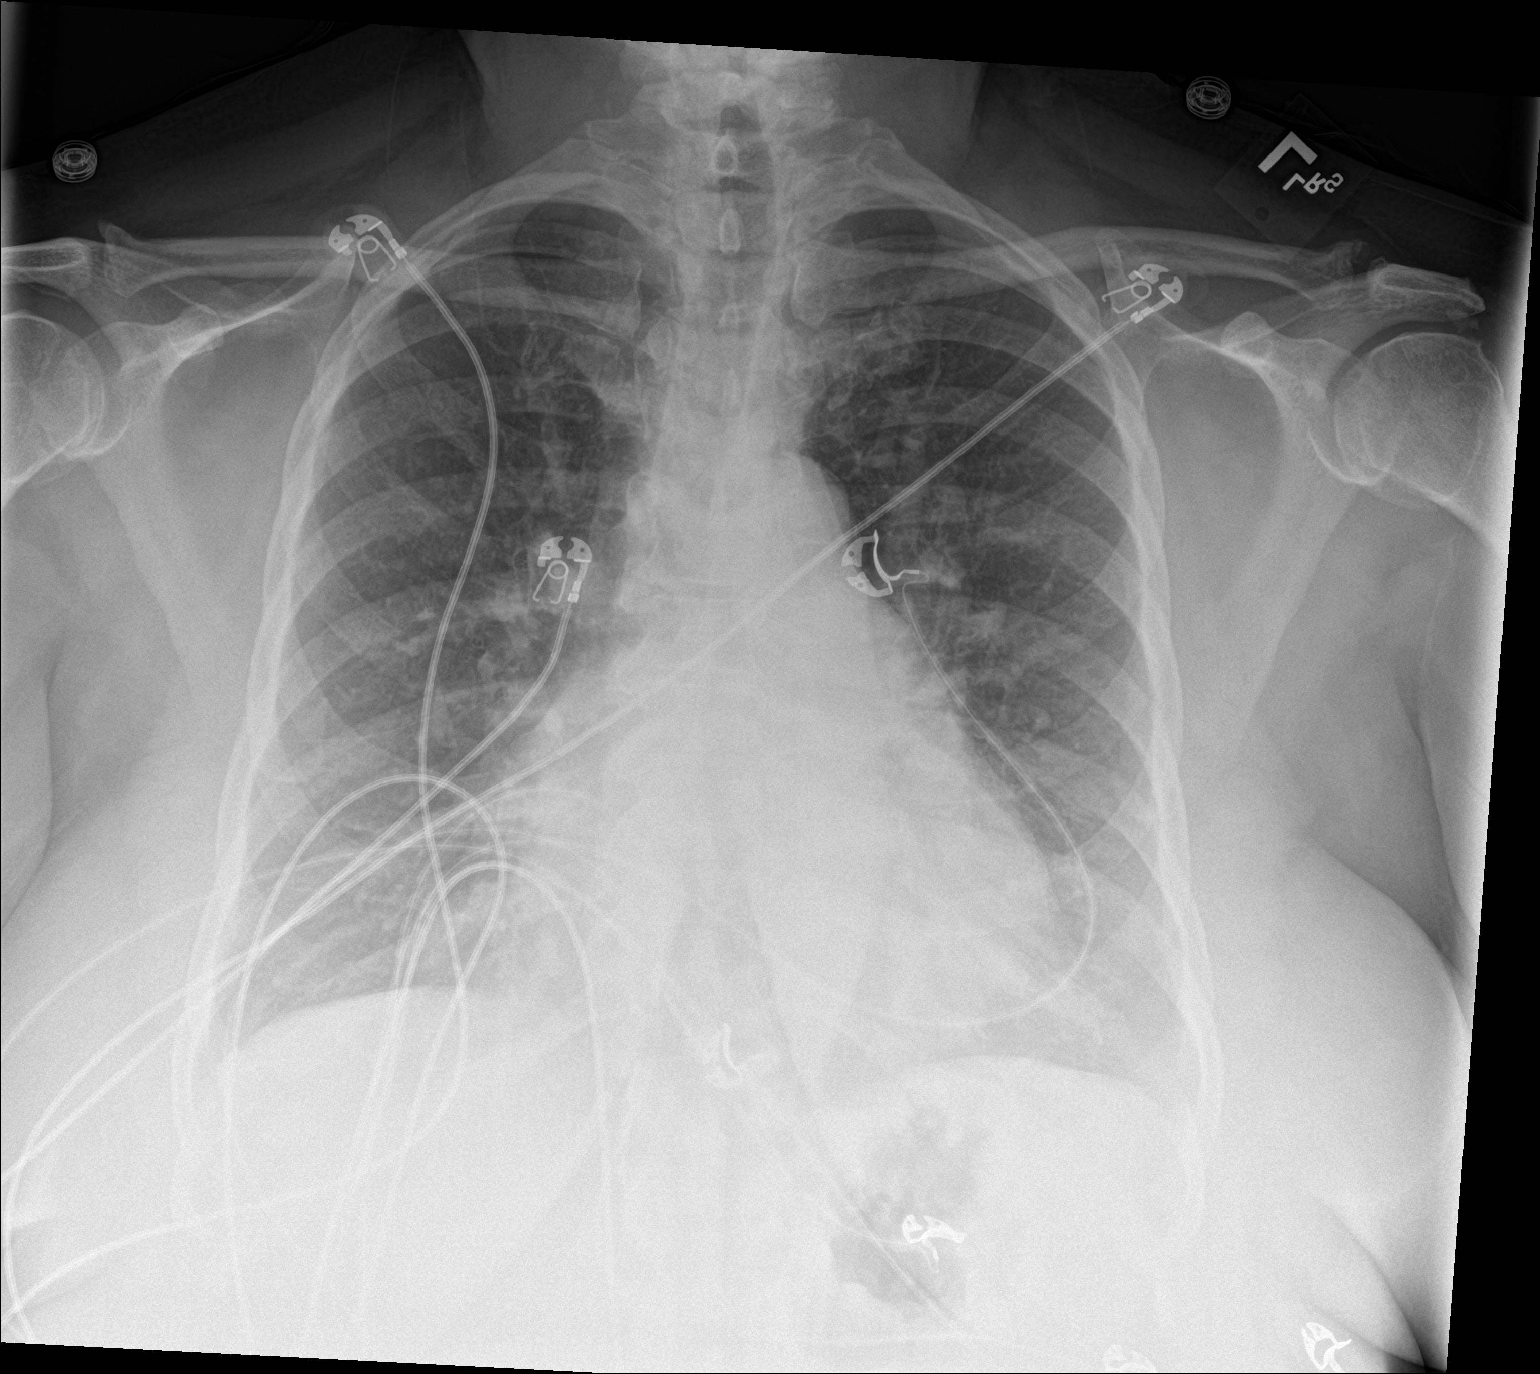

[chest lat]
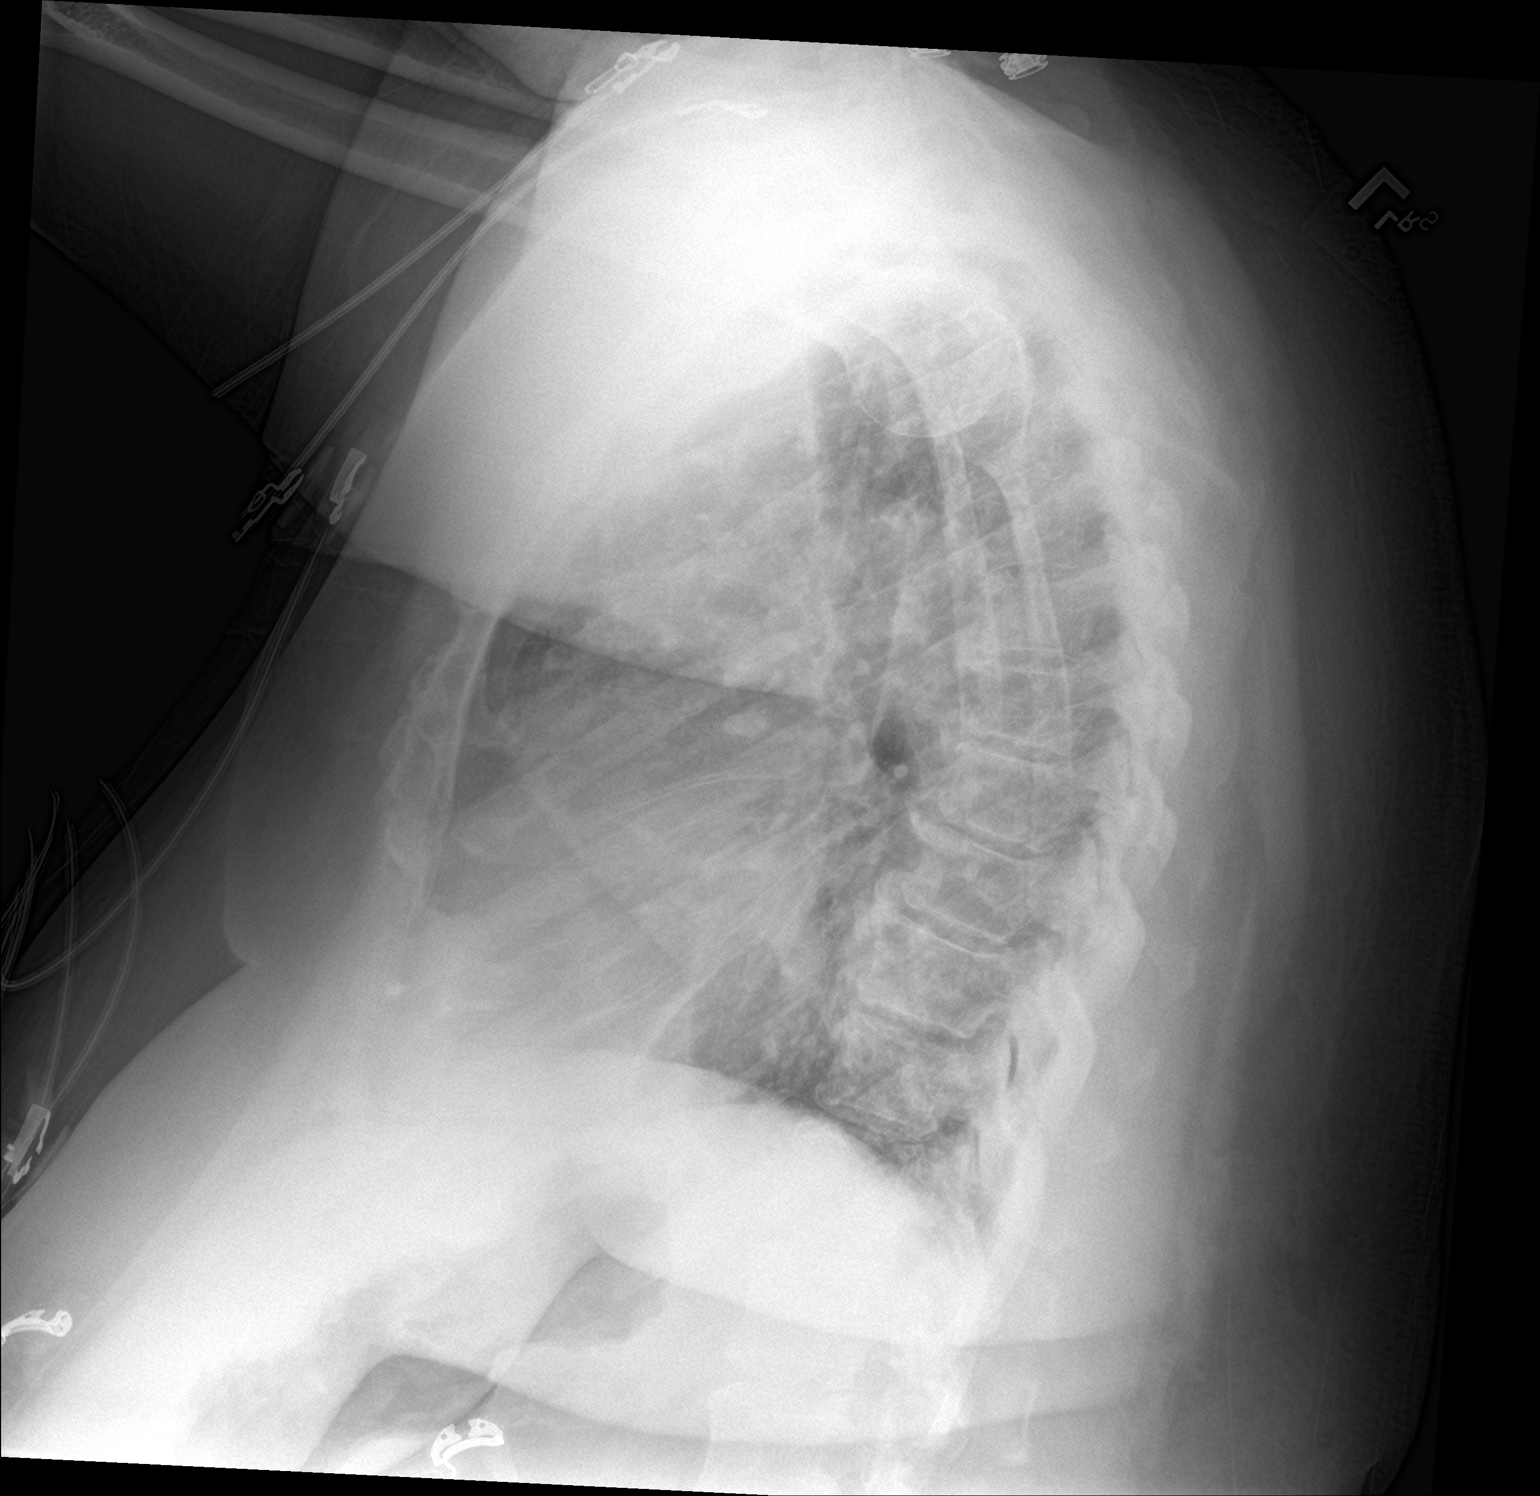

[2 of 2 positions shown; findings below may reference images not displayed]

FINDINGS: Stable cardiomegaly and mediastinal contours. Stable lung volumes.
Stable pulmonary vascularity over this series of exams. No pleural
effusion, pneumothorax or acute pulmonary opacity. Visualized
tracheal air column is within normal limits. No acute osseous
abnormality identified. Negative visible bowel gas pattern.
IMPRESSION: Stable cardiomegaly and pulmonary vascularity without overt edema or
acute cardiopulmonary abnormality.

## 2020-07-07 IMAGING — CT CT ANGIOGRAPHY CHEST
1 of 6 series · 3 of 16 positions shown · IV contrast (omnipaque)
Comparison: Chest x-ray obtained earlier today, 09/02/2018; prior
CT scan of the chest 09/30/2016

CLINICAL DATA: 57-year-old male with sharp central chest pain
radiating into the back

EXAM:
CT ANGIOGRAPHY CHEST WITH CONTRAST
TECHNIQUE: Multidetector CT imaging of the chest was performed using the
standard protocol during bolus administration of intravenous
contrast. Multiplanar CT image reconstructions and MIPs were
obtained to evaluate the vascular anatomy.
CONTRAST:  100mL OMNIPAQUE IOHEXOL 350 MG/ML SOLN

[Series 8: pe thins · axial · 0.63mm/px · z∈[+1179,+1296]mm · 3 of 338 slices shown]
[im 85/338  lung]
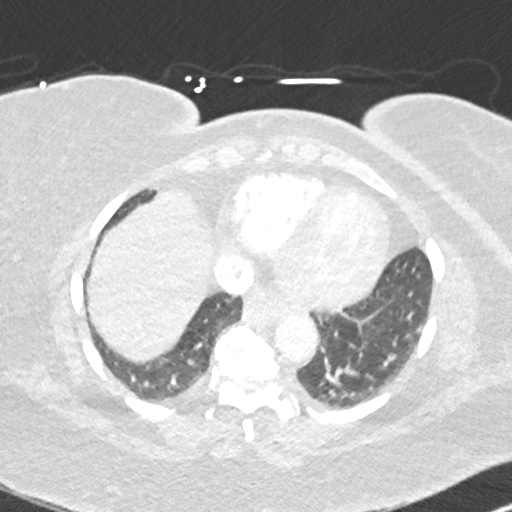
[im 169/338  soft-tissue]
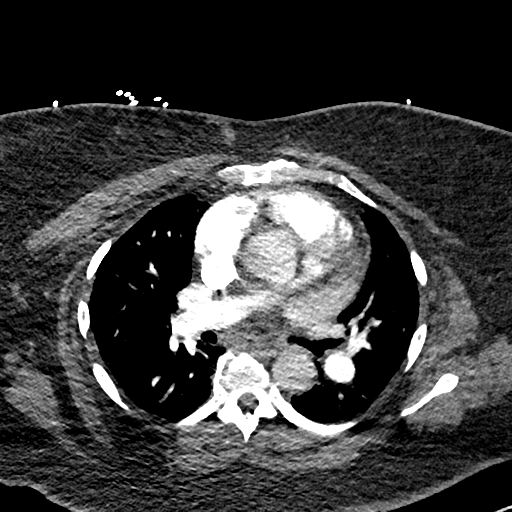
[im 253/338  lung]
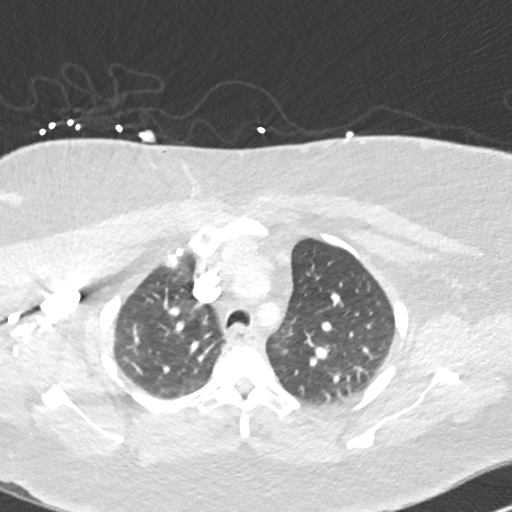

[3 of 16 positions shown; findings below may reference images not displayed]

FINDINGS: Cardiovascular: Adequate opacification of the pulmonary arteries to
the proximal segmental level. No evidence of central filling defect
to suggest acute pulmonary embolus. The main pulmonary artery is
mildly enlarged at 3.1 cm. Two vessel aortic arch configuration. The
right brachiocephalic and left common carotid artery share a common
origin. No evidence of aortic aneurysm. The heart is mildly enlarged
in the left ventricle dilated. No pericardial effusion.

Mediastinum/Nodes: Bulky, globular enlargement of the right thyroid
gland which extends inferior to the clavicle into the upper
mediastinum consistent with goiter. The appearance is similar
compared to the prior CT scan from 09/30/2016. Additionally, a
smoothly marginated soft tissue mass in the prevascular space
posterior to the manubrium is present and measures approximately
x 1.5 cm, minimally changed compared to 09/30/2016. Comparison with
more remote prior imaging from 5150 also demonstrates similar
findings. The esophagus is unremarkable.

Lungs/Pleura: Moderate respiratory motion artifact. Scattered areas
of ground-glass attenuation airspace opacity are favored to reflect
subsegmental atelectasis in the setting of hypoventilation. No
pulmonary edema, pleural effusion or pneumothorax.

Upper Abdomen: No acute abnormality within the upper abdomen.

Musculoskeletal: No acute fracture or aggressive appearing lytic or
blastic osseous lesion.

Review of the MIP images confirms the above findings.
IMPRESSION: 1. Negative for acute pulmonary embolism.
2. Cardiomegaly with left ventricular dilatation. The degree of
dilatation appears slightly increased compared to 09/30/2016.
3. Hypoventilation with scattered areas of subsegmental atelectasis
throughout the lower lobes.
4. Mildly enlarged main pulmonary artery at 3.1 cm raising the
possibility of underlying pulmonary arterial hypertension.
5. Right-sided thyroid goiter remains unchanged.
6. Stable anterior mediastinal adenopathy over multiple prior
studies. As previously noted, this likely represents a benign
process.

## 2020-07-16 ENCOUNTER — Other Ambulatory Visit (INDEPENDENT_AMBULATORY_CARE_PROVIDER_SITE_OTHER): Payer: Self-pay | Admitting: Family Medicine

## 2020-07-16 DIAGNOSIS — E1169 Type 2 diabetes mellitus with other specified complication: Secondary | ICD-10-CM

## 2020-07-19 NOTE — Telephone Encounter (Signed)
Last seen by Dr. Raliegh Scarlet.

## 2020-07-21 NOTE — Telephone Encounter (Signed)
It does not look like pt has been taking this according to our notes.  It has been refused multiple times.  Do you want to refill this?

## 2020-08-04 ENCOUNTER — Other Ambulatory Visit (INDEPENDENT_AMBULATORY_CARE_PROVIDER_SITE_OTHER): Payer: Self-pay | Admitting: Family Medicine

## 2020-08-04 DIAGNOSIS — E1169 Type 2 diabetes mellitus with other specified complication: Secondary | ICD-10-CM

## 2020-08-10 ENCOUNTER — Other Ambulatory Visit: Payer: Self-pay | Admitting: Family

## 2020-08-10 DIAGNOSIS — Z1231 Encounter for screening mammogram for malignant neoplasm of breast: Secondary | ICD-10-CM

## 2020-08-13 ENCOUNTER — Ambulatory Visit
Admission: RE | Admit: 2020-08-13 | Discharge: 2020-08-13 | Disposition: A | Discharge status: Home / Self Care | Payer: Medicare Other | Source: Ambulatory Visit | Attending: Family | Admitting: Family

## 2020-08-13 ENCOUNTER — Other Ambulatory Visit: Payer: Self-pay

## 2020-08-13 DIAGNOSIS — Z1231 Encounter for screening mammogram for malignant neoplasm of breast: Secondary | ICD-10-CM

## 2020-09-03 ENCOUNTER — Emergency Department (HOSPITAL_COMMUNITY)
Admission: EM | Admit: 2020-09-03 | Discharge: 2020-09-03 | Disposition: A | Discharge status: Home / Self Care | Payer: Medicare Other | Attending: Emergency Medicine | Admitting: Emergency Medicine

## 2020-09-03 ENCOUNTER — Other Ambulatory Visit: Payer: Self-pay

## 2020-09-03 ENCOUNTER — Emergency Department (HOSPITAL_COMMUNITY): Payer: Medicare Other

## 2020-09-03 ENCOUNTER — Encounter (HOSPITAL_COMMUNITY): Payer: Self-pay

## 2020-09-03 DIAGNOSIS — I2510 Atherosclerotic heart disease of native coronary artery without angina pectoris: Secondary | ICD-10-CM | POA: Insufficient documentation

## 2020-09-03 DIAGNOSIS — R079 Chest pain, unspecified: Secondary | ICD-10-CM | POA: Diagnosis present

## 2020-09-03 DIAGNOSIS — Z96642 Presence of left artificial hip joint: Secondary | ICD-10-CM | POA: Diagnosis not present

## 2020-09-03 DIAGNOSIS — R0602 Shortness of breath: Secondary | ICD-10-CM | POA: Insufficient documentation

## 2020-09-03 DIAGNOSIS — Z91040 Latex allergy status: Secondary | ICD-10-CM | POA: Insufficient documentation

## 2020-09-03 DIAGNOSIS — E119 Type 2 diabetes mellitus without complications: Secondary | ICD-10-CM | POA: Insufficient documentation

## 2020-09-03 DIAGNOSIS — R197 Diarrhea, unspecified: Secondary | ICD-10-CM | POA: Insufficient documentation

## 2020-09-03 DIAGNOSIS — Z7982 Long term (current) use of aspirin: Secondary | ICD-10-CM | POA: Insufficient documentation

## 2020-09-03 DIAGNOSIS — Z20822 Contact with and (suspected) exposure to covid-19: Secondary | ICD-10-CM | POA: Diagnosis not present

## 2020-09-03 DIAGNOSIS — I110 Hypertensive heart disease with heart failure: Secondary | ICD-10-CM | POA: Insufficient documentation

## 2020-09-03 DIAGNOSIS — J45909 Unspecified asthma, uncomplicated: Secondary | ICD-10-CM | POA: Diagnosis not present

## 2020-09-03 DIAGNOSIS — Z85830 Personal history of malignant neoplasm of bone: Secondary | ICD-10-CM | POA: Insufficient documentation

## 2020-09-03 DIAGNOSIS — R519 Headache, unspecified: Secondary | ICD-10-CM | POA: Insufficient documentation

## 2020-09-03 DIAGNOSIS — I5030 Unspecified diastolic (congestive) heart failure: Secondary | ICD-10-CM | POA: Insufficient documentation

## 2020-09-03 DIAGNOSIS — Z96652 Presence of left artificial knee joint: Secondary | ICD-10-CM | POA: Insufficient documentation

## 2020-09-03 DIAGNOSIS — Z79899 Other long term (current) drug therapy: Secondary | ICD-10-CM | POA: Insufficient documentation

## 2020-09-03 DIAGNOSIS — R0789 Other chest pain: Secondary | ICD-10-CM | POA: Insufficient documentation

## 2020-09-03 DIAGNOSIS — R10816 Epigastric abdominal tenderness: Secondary | ICD-10-CM | POA: Insufficient documentation

## 2020-09-03 DIAGNOSIS — Z87891 Personal history of nicotine dependence: Secondary | ICD-10-CM | POA: Insufficient documentation

## 2020-09-03 DIAGNOSIS — Z794 Long term (current) use of insulin: Secondary | ICD-10-CM | POA: Insufficient documentation

## 2020-09-03 DIAGNOSIS — R112 Nausea with vomiting, unspecified: Secondary | ICD-10-CM | POA: Diagnosis not present

## 2020-09-03 DIAGNOSIS — E860 Dehydration: Secondary | ICD-10-CM | POA: Diagnosis not present

## 2020-09-03 LAB — CBC
HCT: 38.7 % (ref 36.0–46.0)
Hemoglobin: 12.5 g/dL (ref 12.0–15.0)
MCH: 28.9 pg (ref 26.0–34.0)
MCHC: 32.3 g/dL (ref 30.0–36.0)
MCV: 89.6 fL (ref 80.0–100.0)
Platelets: 211 10*3/uL (ref 150–400)
RBC: 4.32 MIL/uL (ref 3.87–5.11)
RDW: 13 % (ref 11.5–15.5)
WBC: 6.8 10*3/uL (ref 4.0–10.5)
nRBC: 0 % (ref 0.0–0.2)

## 2020-09-03 LAB — COMPREHENSIVE METABOLIC PANEL
ALT: 27 U/L (ref 0–44)
AST: 21 U/L (ref 15–41)
Albumin: 3.5 g/dL (ref 3.5–5.0)
Alkaline Phosphatase: 52 U/L (ref 38–126)
Anion gap: 8 (ref 5–15)
BUN: 7 mg/dL (ref 6–20)
CO2: 26 mmol/L (ref 22–32)
Calcium: 8.8 mg/dL — ABNORMAL LOW (ref 8.9–10.3)
Chloride: 105 mmol/L (ref 98–111)
Creatinine, Ser: 1.02 mg/dL — ABNORMAL HIGH (ref 0.44–1.00)
GFR, Estimated: >60 mL/min (ref >60)
Glucose, Bld: 205 mg/dL — ABNORMAL HIGH (ref 70–99)
Potassium: 3.2 mmol/L — ABNORMAL LOW (ref 3.5–5.1)
Sodium: 139 mmol/L (ref 135–145)
Total Bilirubin: 0.4 mg/dL (ref 0.3–1.2)
Total Protein: 6.4 g/dL — ABNORMAL LOW (ref 6.5–8.1)

## 2020-09-03 LAB — BRAIN NATRIURETIC PEPTIDE: B Natriuretic Peptide: 40.2 pg/mL (ref 0.0–100.0)

## 2020-09-03 LAB — TROPONIN I (HIGH SENSITIVITY)
Troponin I (High Sensitivity): 10 ng/L (ref <18)
Troponin I (High Sensitivity): 10 ng/L (ref <18)

## 2020-09-03 LAB — RESP PANEL BY RT-PCR (FLU A&B, COVID) ARPGX2
Influenza A by PCR: NEGATIVE
Influenza B by PCR: NEGATIVE
SARS Coronavirus 2 by RT PCR: NEGATIVE

## 2020-09-03 LAB — D-DIMER, QUANTITATIVE: D-Dimer, Quant: 1.14 ug/mL-FEU — ABNORMAL HIGH (ref 0.00–0.50)

## 2020-09-03 MED ORDER — ACETAMINOPHEN 500 MG PO TABS
1000.0000 mg | ORAL_TABLET | Freq: Once | ORAL | Status: AC
  Administered 2020-09-03: 1000 mg via ORAL
  Filled 2020-09-03: qty 2

## 2020-09-03 MED ORDER — ONDANSETRON HCL 4 MG PO TABS: 4.0000 mg | ORAL_TABLET | Freq: Four times a day (QID) | ORAL | 0 refills | Status: DC

## 2020-09-03 MED ORDER — IBUPROFEN 400 MG PO TABS
400.0000 mg | ORAL_TABLET | Freq: Once | ORAL | Status: AC
  Administered 2020-09-03: 400 mg via ORAL
  Filled 2020-09-03: qty 1

## 2020-09-03 MED ORDER — IOHEXOL 350 MG/ML SOLN
50.0000 mL | Freq: Once | INTRAVENOUS | Status: AC | PRN
  Administered 2020-09-03: 50 mL via INTRAVENOUS

## 2020-09-03 MED ORDER — ONDANSETRON HCL 4 MG/2ML IJ SOLN
4.0000 mg | Freq: Once | INTRAMUSCULAR | Status: AC
  Administered 2020-09-03: 4 mg via INTRAVENOUS
  Filled 2020-09-03: qty 2

## 2020-09-03 MED ORDER — LACTATED RINGERS IV BOLUS
500.0000 mL | Freq: Once | INTRAVENOUS | Status: AC
  Administered 2020-09-03: 500 mL via INTRAVENOUS

## 2020-09-03 NOTE — ED Triage Notes (Signed)
Pt bib [facility] from home. Pt c/o chest pain, back pain, nausea, and shortness of breath for past 4 days, states pain and shortness of breath worsened tonight. Pt has had some diarrhea and a headache.  Pt received an ASA 324mg  by EMS.

## 2020-09-03 NOTE — ED Notes (Signed)
Patient transported to CT 

## 2020-09-03 NOTE — ED Notes (Signed)
PO challenge started

## 2020-09-03 NOTE — ED Provider Notes (Signed)
  Emergency Medicine Provider in Triage Note   MSE was initiated and I personally evaluated the patient  1:50 AM on Sep 03, 2020 as provider in triage.   Chief Complaint: chest pain  HPI  Patient is a 60 y.o. who presents to the ED with complaints of chest pain/back pain x 4 days. Patient reports pain is constant, feels different in the chest and the back, does not go straight through. Having associated cough, dyspnea, nausea, emesis, diarrhea, & chills.     Review of Systems  Positive: Cough, chest pain, dyspnea, chills, n/v/d Negative: Leg swelling, syncope, hemoptysis  Physical Exam  BP (!) 161/74 (BP Location: Left Arm)   Pulse 74   Temp 98.5 F (36.9 C) (Oral)   Resp 18   SpO2 98%    Gen:   Awake, no distress   HEENT:  Atraumatic  Resp:  Normal effort  Cardiac:  Normal rate  Abd:   Nondistended, nontender  MSK:   Moves extremities without difficulty  Neuro:  Speech clear   Medical Decision Making   Initiation of care has begun. The patient has been counseled on the process, plan, and necessity for staying for the completion/evaluation, informed that the remainder of the evaluation will be completed by another provider, this initial triage assessment does not replace that evaluation, and the importance of remaining in the ED until their evaluation is complete.   Clinical Impression  Chest pain.         Leafy Kindle 09/03/20 0152    Fatima Blank, MD 09/06/20 2110

## 2020-09-03 NOTE — ED Notes (Signed)
Patient is resting comfortably. 

## 2020-09-03 NOTE — Discharge Instructions (Signed)
Everything with your heart looks normal today and no signs of congestive heart failure.  No signs of heart attack.  X-ray showed no evidence of blood clots or pneumonia today.  Concerned that you may be having a mild viral bug if that is the case it should be improving in the next few days.  If you start having persistent pain, inability to breathe, weakness on one side of your body you should return to the emergency room immediately.  A prescription for nausea medication was sent to the pharmacy.  You can continue to use Imodium if you need if you are having diarrhea but if not you should not continue to use that medication.

## 2020-09-03 NOTE — ED Provider Notes (Signed)
[facility] EMERGENCY DEPARTMENT Provider Note   CSN: [id number] Arrival date & time: 09/03/20  0126     History Chief Complaint  Patient presents with  . Chest Pain    [name] is a 60 y.o. female.  Patient is a 60 year old female with a history of CHF, asthma, diabetes, glaucoma, hypertension, PE on Coumadin for less than 6 months 15 years ago, prior anginal type pain with cardiac catheterization in 2018 in 2016 both with normal caths who is presenting today with multiple complaints.  Patient reports since Monday which is 5 days prior to arrival she has not been feeling well.  She has had generalized tight chest pain that seems to be most commonly in the center and goes into her back, neck and shoulders, intermittent shortness of breath, intermittent cough that is nonproductive, nausea, vomiting and diarrhea.  Patient reports on Monday and Tuesday she was having the more generalized aches and pains including headache and nausea.  She then developed vomiting and diarrhea on Wednesday and then yesterday had continued diarrhea.  She is not aware of a fever but has had chills.  The chest pain did not seem to be due to any particular activity.  She could get it at rest, when laying down or with exertion.  The shortness of breath was waxing and waning.  She has not had anything to eat in a few days due to nausea.  She cannot say that eating makes the pain worse because she really has not had anything.  She denies any unilateral leg pain or swelling.  She denies any urinary complaints.  She has not tried taking anything for the pain.  No known sick contacts.  Recent medication changes and she is still taking all of her medications that are prescribed.  The history is provided by the patient.  Chest Pain      Past Medical History:  Diagnosis Date  . Anginal pain ([place])    a. NL cath in 2008;  b. Myoview 03/2011: dec uptake along mid anterior wall on stress imaging ->  ? attenuation vs. ischemia, EF 65%;  c. Echo 04/2011: EF 55-60%, no RWMA, Gr 2 dd  . Anxiety   . Arthritis   . Asthma   . Back pain   . Bone cancer ([place])   . Cancer ([place])   . Chest pain   . CHF (congestive heart failure) ([place])   . CHF (congestive heart failure) ([place])   . Depression   . Diabetes mellitus   . Drug use   . Dyspnea    with exertion  . Dyspnea   . Frequent urination   . GERD (gastroesophageal reflux disease)   . Glaucoma   . HLD (hyperlipidemia)   . Hypertension   . IBS (irritable bowel syndrome)   . Joint pain   . Lactose intolerance   . Leg edema   . 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 Obesity   . Palpitations   . Pneumonia 05/2016   double  . Pulmonary edema   . Pulmonary embolism ([place])    a. 2008 -> coumadin x 6 mos.  . Rheumatoid arthritis ([place])   . Sleep apnea    on CPAP 02/2018  . TIA (transient ischemic attack)   . Urinary urgency     Patient Active Problem List   Diagnosis Date Noted  . At risk for impaired metabolic function 29/79/8921  . Unilateral  primary osteoarthritis, left hip   . Hip arthritis 06/11/2018  . Class 3 severe obesity with serious comorbidity and body mass index (BMI) of 40.0 to 44.9 in adult ([place]) 05/29/2018  . Other fatigue 11/20/2017  . Shortness of breath on exertion 11/20/2017  . Diabetes mellitus ([place]) 11/20/2017  . Vitamin D deficiency 11/20/2017  . Depression 11/20/2017  . Other hyperlipidemia 11/20/2017  . 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 Presence of left artificial knee joint 09/20/2016  . Arthritis of knee 08/22/2016  . Cervical radiculopathy 07/26/2016  . Cervical disc disorder with radiculopathy 07/26/2016  . Right arm pain 06/29/2016  . Cervicalgia 06/29/2016  . Primary osteoarthritis of left knee 06/29/2016  . Hypersomnia with sleep apnea 11/18/2015  . Lethargy 11/18/2015  . Chest pain 09/30/2014  . Abnormal cardiac function test 09/29/2014  . Chest pain with moderate  risk for cardiac etiology 09/28/2014  . HTN (hypertension) 09/28/2014  . Hypokalemia   . Paresthesia 06/18/2014  . Numbness and tingling of left arm and leg   . Unstable angina ([place]) 05/24/2014  . OSA on CPAP 05/24/2014  . CAD (coronary artery disease) 11/25/2012  . S/P laparoscopic appendectomy 06/04/2012  . Diastolic CHF ([place]) 75/01/2584  . Mediastinal mass 01/09/2012  . SOB (shortness of breath) 06/20/2011  . Mediastinal abnormality 06/20/2011  . Dyslipidemia 12/23/2009  . GLAUCOMA 12/23/2009  . ARTHRITIS 12/23/2009  . 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 Class 2 severe obesity with serious comorbidity and body mass index (BMI) of 38.0 to 38.9 in adult ([place]) 09/13/2006  . ANXIETY STATE NOS 09/13/2006  . DISORDER, DEPRESSIVE NEC 09/13/2006  . CARPAL TUNNEL SYNDROME, MILD 09/13/2006  . Unspecified essential hypertension 09/13/2006  . IBS 09/13/2006  . DEGENERATION, LUMBAR/LUMBOSACRAL DISC 09/13/2006  . SYMPTOM, SWELLING/MASS/LUMP IN CHEST 09/13/2006  . PULMONARY EMBOLISM, HX OF 09/13/2006    Past Surgical History:  Procedure Laterality Date  . ABDOMINAL HYSTERECTOMY  2005  . APPENDECTOMY    . CARDIAC CATHETERIZATION     Normal  . CARDIAC CATHETERIZATION N/A 09/30/2014   Procedure: Left Heart Cath and Coronary Angiography;  Surgeon: Sherren Mocha, MD;  Location: [facility];  Service: Cardiovascular;  Laterality: N/A;  . LAPAROSCOPIC APPENDECTOMY N/A 06/03/2012   Procedure: APPENDECTOMY LAPAROSCOPIC;  Surgeon: Stark Klein, MD;  Location: [facility];  Service: General;  Laterality: N/A;  . Left knee surgery  2008  . LEG SURGERY    . TONSILLECTOMY    . TOTAL HIP ARTHROPLASTY Left 06/11/2018   Procedure: LEFT TOTAL HIP ARTHROPLASTY ANTERIOR APPROACH;  Surgeon: Meredith Pel, MD;  Location: [facility];  Service: Orthopedics;  Laterality: Left;  . TOTAL KNEE ARTHROPLASTY Left 08/22/2016   Procedure: TOTAL KNEE ARTHROPLASTY;  Surgeon: Meredith Pel, MD;  Location: [facility];  Service:  Orthopedics;  Laterality: Left;  . TUBAL LIGATION  1989     OB History    Gravida  3   Para      Term      Preterm      AB      Living        SAB      IAB      Ectopic      Multiple      Live Births              Family History  Problem Relation Age of Onset  . Emphysema Mother   . Arthritis Mother   . Heart failure Mother  alive @ 81  . Stroke Mother   . Diabetes Mother   . Hypertension Mother   . Hyperlipidemia Mother   . Depression Mother   . Anxiety disorder Mother   . Asthma Brother   . Heart disease Father        died @ 51's.  . Stroke Father   . Diabetes Father   . Hyperlipidemia Father   . Hypertension Father   . Bipolar disorder Father   . Sleep apnea Father   . Alcoholism Father   . Drug abuse Father   . Diabetes Sister   . Heart disease Paternal Grandfather   . Colon cancer Maternal Grandfather   . Breast cancer Maternal Aunt     Social History   Tobacco Use  . Smoking status: Former Smoker    Packs/day: 0.50    Years: 15.00    Pack years: 7.50    Types: Cigarettes    Quit date: 04/24/1985    Years since quitting: 35.3  . Smokeless tobacco: Never Used  Substance Use Topics  . Alcohol use: No    Alcohol/week: 0.0 standard drinks  . Drug use: Not Currently    Types: Marijuana    Home Medications Prior to Admission medications   Medication Sig Start Date End Date Taking? Authorizing Provider  albuterol (PROVENTIL HFA;VENTOLIN HFA) 108 (90 Base) MCG/ACT inhaler Inhale 2 puffs into the lungs every 6 (six) hours as needed for wheezing or shortness of breath.    [provider]  aspirin 81 MG chewable tablet Chew 81 mg by mouth daily.    [provider]  atorvastatin (LIPITOR) 40 MG tablet Take 40 mg by mouth at bedtime.     [provider]  busPIRone (BUSPAR) 30 MG tablet Take 30 mg by mouth 2 (two) times daily. 12/18/19   [provider]  carvedilol (COREG) 12.5 MG tablet Take 12.5 mg by  mouth at bedtime. 08/11/19   [provider]  Cholecalciferol (VITAMIN D) 125 MCG (5000 UT) CAPS Take 2 capsules by mouth daily. 10/28/19   Whitmire, Joneen Boers, FNP  cyclobenzaprine (FLEXERIL) 10 MG tablet Take 1 tablet (10 mg total) by mouth at bedtime. 12/21/19   Rayna Sexton, PA-C  diclofenac Sodium (VOLTAREN) 1 % GEL Apply 2 g topically 4 (four) times daily. 09/20/19   [provider]  escitalopram (LEXAPRO) 20 MG tablet Take 20 mg by mouth daily. 02/21/19   [provider]  furosemide (LASIX) 40 MG tablet Take 40 mg by mouth daily.    Rai, Ripudeep K, MD  isosorbide dinitrate (ISORDIL) 20 MG tablet Take 20 mg by mouth 2 (two) times daily.    [provider]  lisinopril (PRINIVIL,ZESTRIL) 20 MG tablet Take 20 mg by mouth daily.  09/21/14   [provider]  loperamide (IMODIUM A-D) 2 MG tablet Take 4 mg by mouth 4 (four) times daily as needed for diarrhea or loose stools.     [provider]  meloxicam (MOBIC) 15 MG tablet Take 15 mg by mouth daily as needed. 12/05/19   [provider]  MYRBETRIQ 25 MG TB24 tablet Take 1 tablet (25 mg total) by mouth daily. 03/13/19   Dennard Nip D, MD  omeprazole (PRILOSEC) 20 MG capsule Take 20 mg by mouth 2 (two) times daily. 10/05/16   [provider]  potassium chloride (KLOR-CON M10) 10 MEQ tablet Take 1 tablet (10 mEq total) by mouth 2 (two) times daily. 05/27/18   Georgia Lopes,  DO  promethazine-dextromethorphan (PROMETHAZINE-DM) 6.25-15 MG/5ML syrup Take 5 mLs by mouth at bedtime as needed for cough. 01/12/20   Jaynee Eagles, PA-C  pseudoephedrine (SUDAFED) 30 MG tablet Take 1 tablet (30 mg total) by mouth every 8 (eight) hours as needed for congestion. 01/12/20   Jaynee Eagles, PA-C  Semaglutide,0.25 or 0.5MG /DOS, (OZEMPIC, 0.25 OR 0.5 MG/DOSE,) 2 MG/1.5ML SOPN Inject 0.5 mg into the skin once a week. DX:E11.69 04/12/20   Opalski, Neoma Laming, DO  tiZANidine (ZANAFLEX) 4 MG tablet Take 4 mg by  mouth every 8 (eight) hours as needed. 12/22/19   [provider]  topiramate (TOPAMAX) 25 MG tablet Take 1 tablet (25 mg total) by mouth at bedtime. 04/12/20   Opalski, Neoma Laming, DO  traMADol (ULTRAM) 50 MG tablet Take 1 tablet (50 mg total) by mouth every 6 (six) hours as needed. 11/08/19   Drenda Freeze, MD  traZODone (DESYREL) 50 MG tablet Take 1 tablet by mouth at bedtime. 09/09/19   [provider]    Allergies    Penicillins, Hydrocodone-acetaminophen, Latex, and Sulfa antibiotics  Review of Systems   Review of Systems  Cardiovascular: Positive for chest pain.  All other systems reviewed and are negative.   Physical Exam Updated Vital Signs BP (!) 155/70   Pulse 72   Temp 97.8 F (36.6 C) (Oral)   Resp (!) 27   Ht 5' (1.524 m)   Wt 98 kg   SpO2 100%   BMI 42.19 kg/m   Physical Exam Vitals and nursing note reviewed.  Constitutional:      General: She is not in acute distress.    Appearance: She is well-developed.  HENT:     Head: Normocephalic and atraumatic.  Eyes:     Conjunctiva/sclera: Conjunctivae normal.     Pupils: Pupils are equal, round, and reactive to light.  Cardiovascular:     Rate and Rhythm: Normal rate and regular rhythm.     Heart sounds: Normal heart sounds. No murmur heard.   Pulmonary:     Effort: Pulmonary effort is normal. No respiratory distress.     Breath sounds: Normal breath sounds. No wheezing or rales.     Comments: Mild diffuse chest tenderness with palpation Chest:     Chest wall: Tenderness present.  Abdominal:     General: There is no distension.     Palpations: Abdomen is soft.     Tenderness: There is abdominal tenderness. There is no guarding or rebound.     Comments: Mild epigastric tenderness.  Musculoskeletal:        General: No tenderness. Normal range of motion.     Cervical back: Normal range of motion and neck supple.     Right lower leg: No edema.     Left lower leg: No edema.  Skin:     General: Skin is warm and dry.     Findings: No erythema or rash.  Neurological:     Mental Status: She is alert and oriented to person, place, and time.  Psychiatric:        Behavior: Behavior normal.     ED Results / Procedures / Treatments   Labs (all labs ordered are listed, but only abnormal results are displayed) Labs Reviewed  COMPREHENSIVE METABOLIC PANEL - Abnormal; Notable for the following components:      Result Value   Potassium 3.2 (*)    Glucose, Bld 205 (*)    Creatinine, Ser 1.02 (*)    Calcium 8.8 (*)  Total Protein 6.4 (*)    All other components within normal limits  D-DIMER, QUANTITATIVE - Abnormal; Notable for the following components:   D-Dimer, Quant 1.14 (*)    All other components within normal limits  RESP PANEL BY RT-PCR (FLU A&B, COVID) ARPGX2  CBC  BRAIN NATRIURETIC PEPTIDE  TROPONIN I (HIGH SENSITIVITY)  TROPONIN I (HIGH SENSITIVITY)    EKG EKG Interpretation  Date/Time:  Friday Sep 03 2020 01:27:47 EDT Ventricular Rate:  74 PR Interval:  204 QRS Duration: 108 QT Interval:  398 QTC Calculation: 441 R Axis:   89 Text Interpretation: Normal sinus rhythm Incomplete left bundle branch block Nonspecific T wave abnormality No significant change since last tracing Confirmed by Gwyneth Sprout ([id number]) on 09/03/2020 8:56:35 AM   Radiology DG Chest 2 View  Result Date: 09/03/2020 CLINICAL DATA:  Chest pain, back pain, nausea, and shortness of breath for past 4 days, EXAM: CHEST - 2 VIEW COMPARISON:  Chest x-ray 05/19/2020, CT chest 05/19/2020 FINDINGS: The heart size and mediastinal contours are within normal limits. Linear atelectasis at the left base. No focal consolidation. No pulmonary edema. No pleural effusion. No pneumothorax. No acute osseous abnormality. IMPRESSION: No active cardiopulmonary disease. Electronically Signed   By: Tish Frederickson M.D.   On: 09/03/2020 02:40   CT Angio Chest PE W and/or Wo Contrast  Result Date:  09/03/2020 CLINICAL DATA:  Shortness of breath EXAM: CT ANGIOGRAPHY CHEST WITH CONTRAST TECHNIQUE: Multidetector CT imaging of the chest was performed using the standard protocol during bolus administration of intravenous contrast. Multiplanar CT image reconstructions and MIPs were obtained to evaluate the vascular anatomy. CONTRAST:  53mL OMNIPAQUE IOHEXOL 350 MG/ML SOLN COMPARISON:  Chest radiograph Sep 03, 2020; chest CT May 19, 2020 FINDINGS: Cardiovascular: There is no appreciable pulmonary embolus. There is no thoracic aortic aneurysm or dissection. Visualized great vessels appear unremarkable. Note that the right innominate and left common carotid arteries arise as a common trunk, an anatomic variant. There is mild aortic atherosclerosis. There is no pericardial effusion or pericardial thickening. Main pulmonary outflow tract measures 3.1 cm, prominent. Mediastinum/Nodes: There is enlargement of the right lobe of the thyroid with an apparent mass in the right lobe measuring 3.4 x 2.9 cm. No appreciable thoracic adenopathy. No appreciable esophageal lesion. Lungs/Pleura: There are scattered foci of atelectatic change. No edema or airspace opacity. No appreciable pleural effusions. Upper Abdomen: Visualized upper abdominal structures appear normal. Musculoskeletal: There are foci of degenerative change in the thoracic spine. No blastic or lytic bone lesions. No evident chest wall lesions. Review of the MIP images confirms the above findings. IMPRESSION: 1. No appreciable pulmonary embolus. No thoracic aortic aneurysm or dissection. There are foci of aortic atherosclerosis. 2. Prominence of the main pulmonary outflow tract likely indicative of a degree of pulmonary arterial hypertension. 3. Right-sided thyroid mass measuring 3.4 x 2.9 cm. Recommend thyroid US per consensus guidelines. (Ref: J Am Coll Radiol. 2015 Feb;12(2): 143-50). 4.  No evident thoracic adenopathy. Aortic Atherosclerosis (ICD10-I70.0).  Electronically Signed   By: Bretta Bang III M.D.   On: 09/03/2020 14:24    Procedures Procedures   Medications Ordered in ED Medications  lactated ringers bolus 500 mL (has no administration in time range)  ondansetron (ZOFRAN) injection 4 mg (has no administration in time range)  acetaminophen (TYLENOL) tablet 1,000 mg (has no administration in time range)    ED Course  I have reviewed the triage vital signs and the nursing notes.  Pertinent labs &  imaging results that were available during my care of the patient were reviewed by me and considered in my medical decision making (see chart for details).    MDM Rules/Calculators/A&P                          60 year old female with multiple medical problems presenting with nonspecific chest pain in addition to other more infectious complaints.  Patient's pain is not characteristic of ACS, dissection, CHF or pericarditis/tamponade.  Patient's EKG today is unchanged and troponins x2 are stable at 10.  Patient's breath sounds are clear without any wheezing at this time.  She has had nausea vomiting and symptoms could be GI in nature versus infectious.  Chest x-ray without evidence of active disease today, CBC, CMP, COVID and flu are all unremarkable.  Given patient's prior history of PE and nonspecific chest pain and intermittent short of breath will check a D-dimer.  Patient given gentle fluids and nausea medication.  Also given Tylenol for body aches and pains.  11:30 AM D-dimer is elevated and will get CTA to r/o PE. \ 2:45 PM CT is negative for acute findings.  Thyroid nodule is already known and PCP is following for ultrasound.  Patient was able to tolerate p.o.'s here and feel that she is stable for discharge.  Findings discussed with the patient.  She will follow-up with her doctor next week for recheck.  MDM Number of Diagnoses or Management Options   Amount and/or Complexity of Data Reviewed Clinical lab tests: ordered and  reviewed Tests in the radiology section of CPT: ordered and reviewed Tests in the medicine section of CPT: ordered and reviewed Independent visualization of images, tracings, or specimens: yes     Final Clinical Impression(s) / ED Diagnoses Final diagnoses:  Chest pain, unspecified type  Nonintractable headache, unspecified chronicity pattern, unspecified headache type  Dehydration    Rx / DC Orders ED Discharge Orders         Ordered    ondansetron (ZOFRAN) 4 MG tablet  Every 6 hours        09/03/20 [place], [place], MD 09/03/20 1446

## 2020-09-22 IMAGING — DX CHEST - 2 VIEW
2 series · 2 of 2 positions shown · non-contrast
Comparison: Prior radiograph from 09/02/2018

CLINICAL DATA: Initial evaluation for acute chest pain with
shortness of breath, extremity swelling.

EXAM:
CHEST - 2 VIEW

[chest pa]
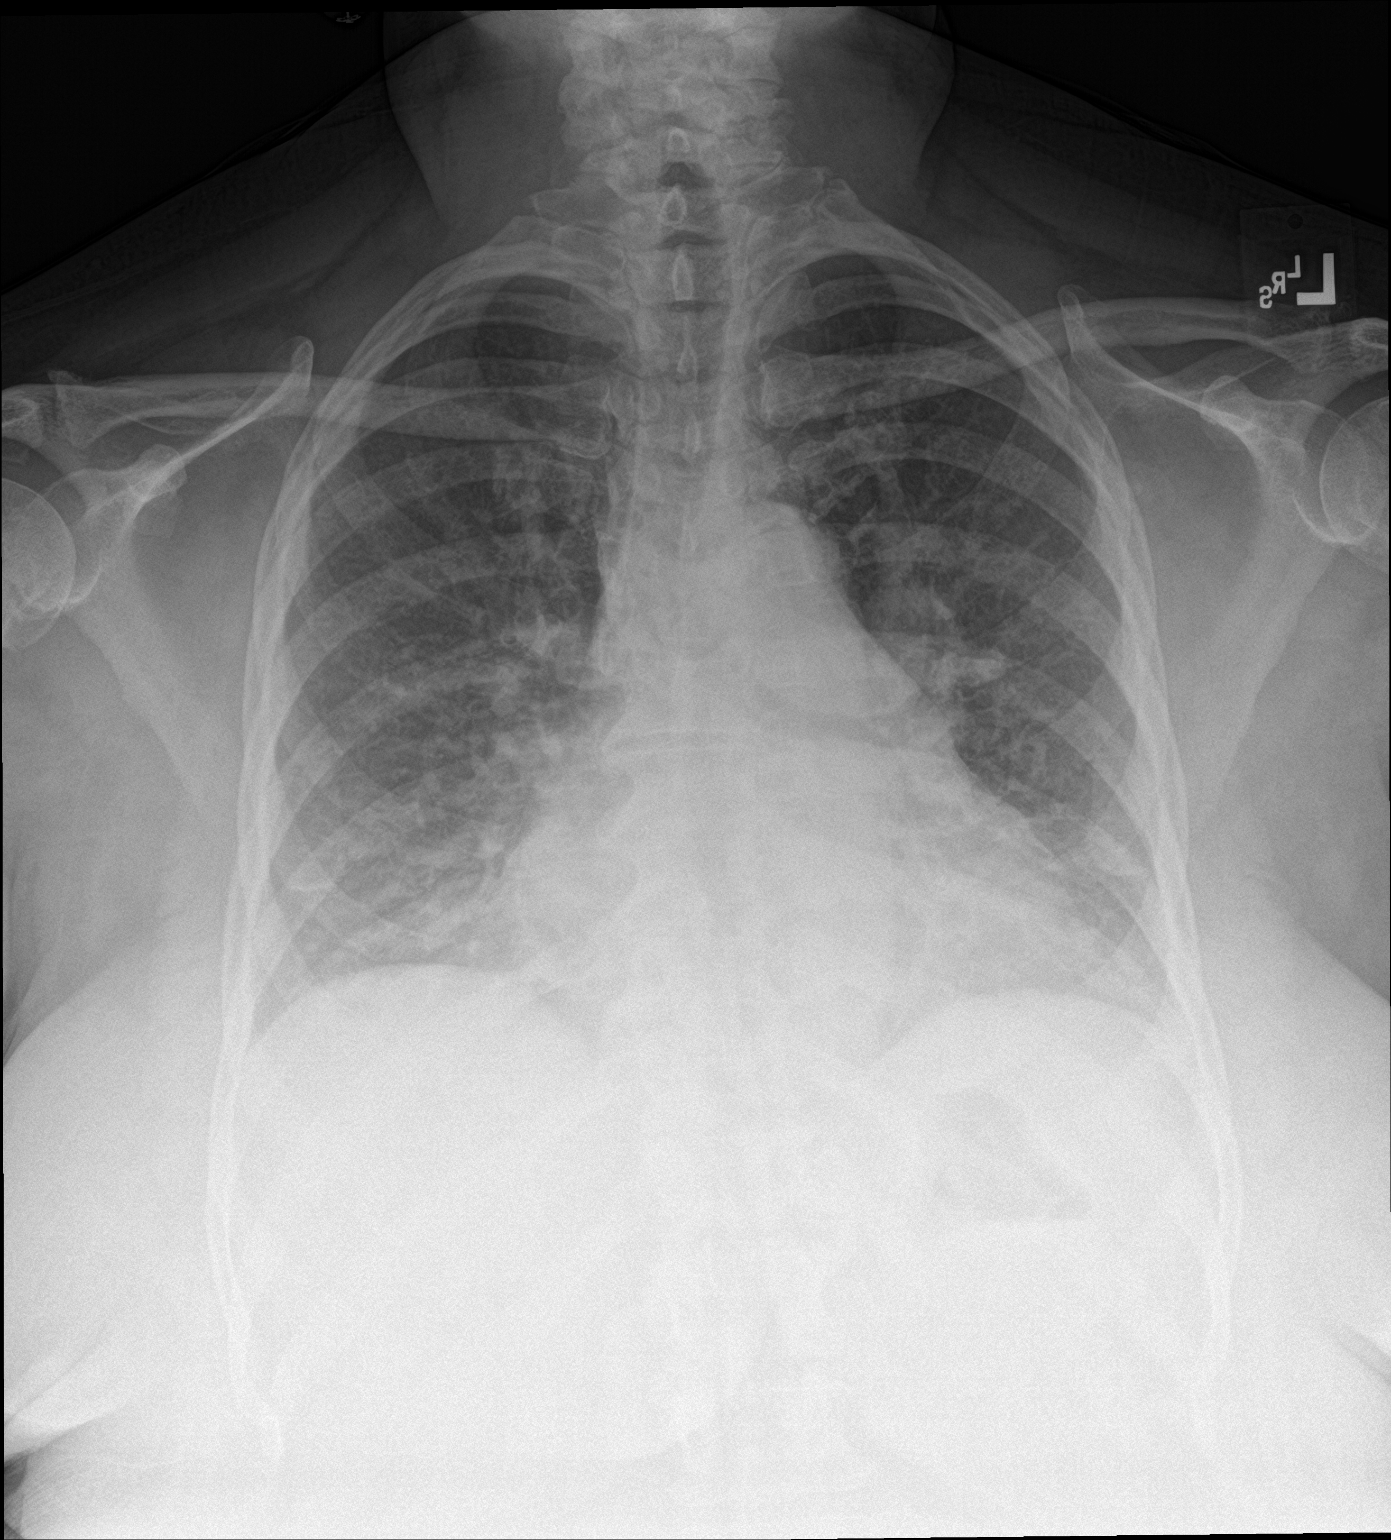

[chest lat]
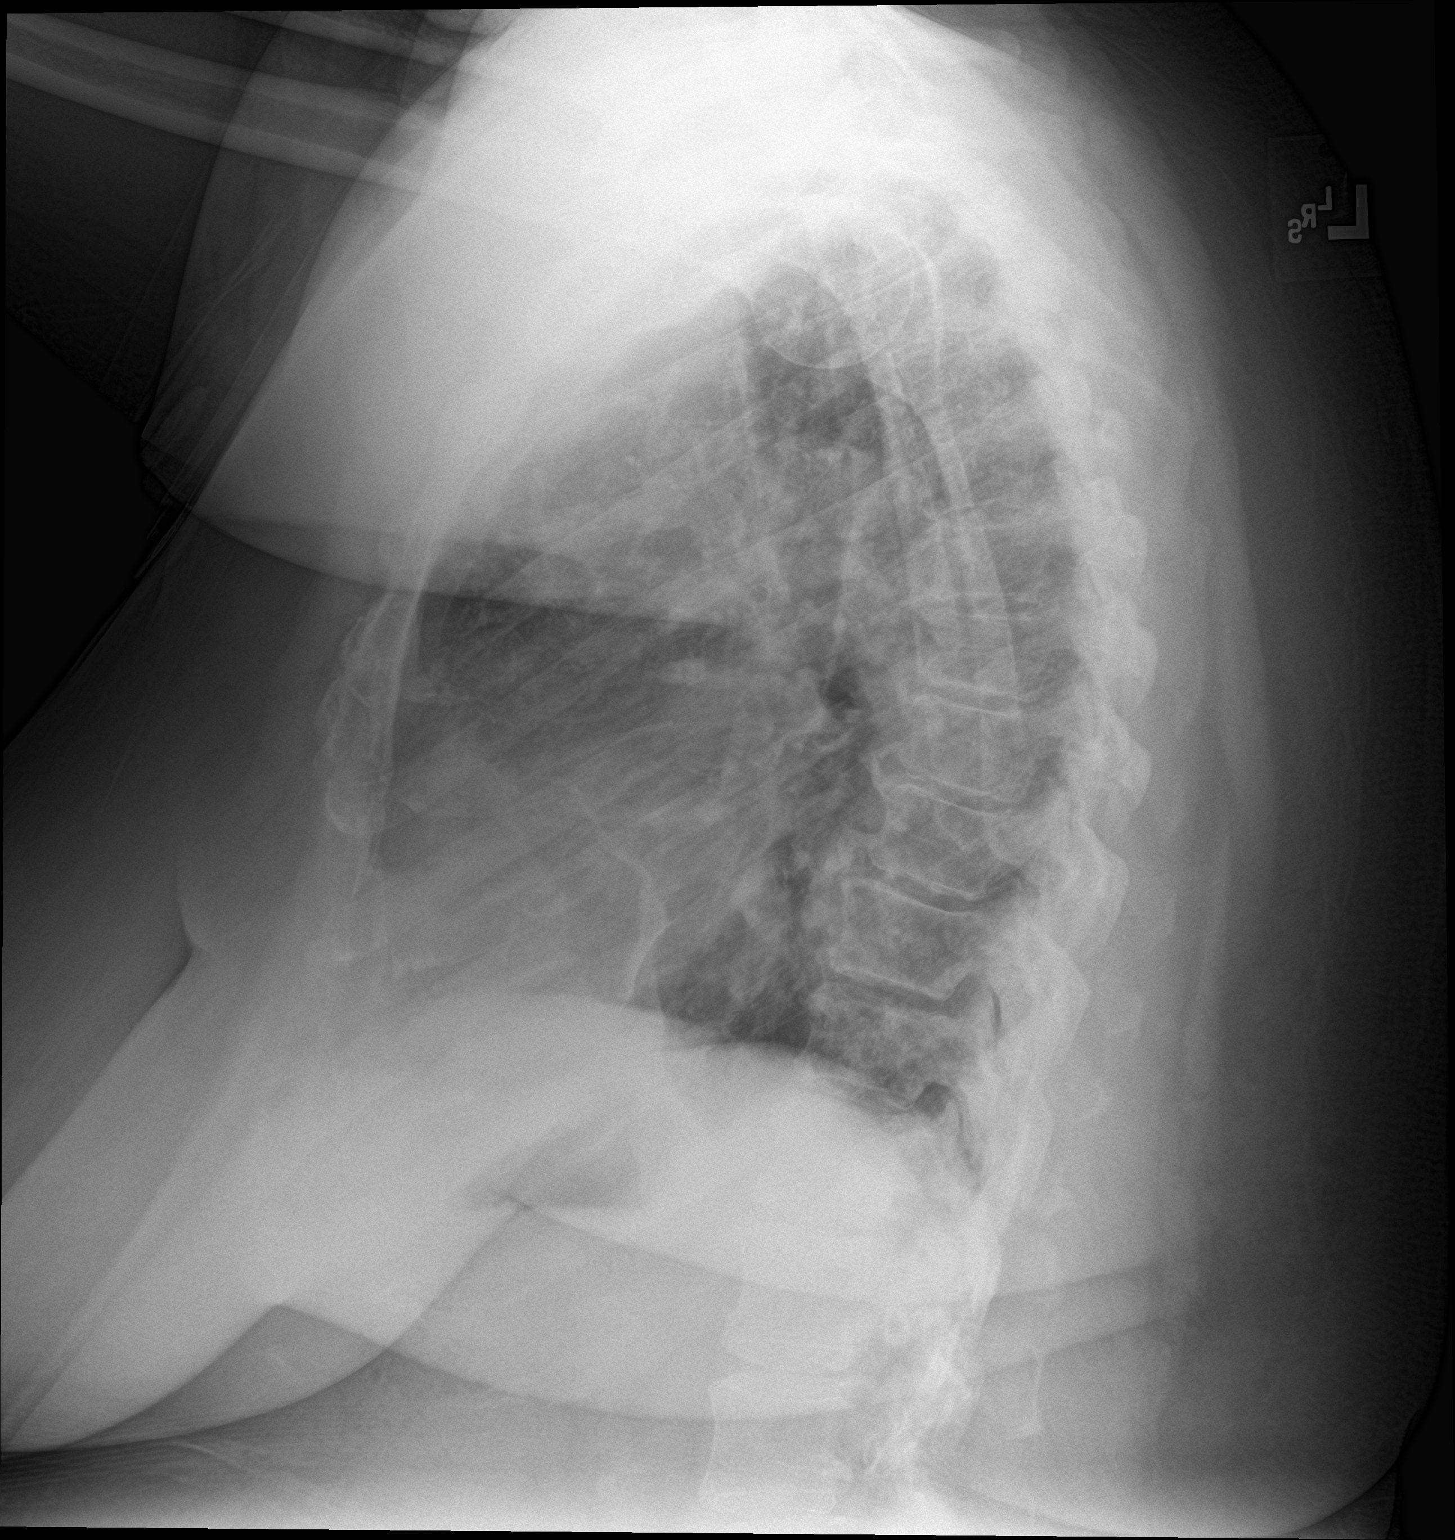

[2 of 2 positions shown; findings below may reference images not displayed]

FINDINGS: Mild cardiomegaly, stable. Mediastinal silhouette within normal
limits.

Lungs normally inflated. Diffuse pulmonary vascular congestion with
interstitial prominence, suggesting pulmonary interstitial edema. No
significant pleural effusion. No definite superimposed focal
infiltrates. No pneumothorax.

No acute osseous finding.
IMPRESSION: Cardiomegaly with diffuse pulmonary vascular congestion and
interstitial prominence, suggesting pulmonary interstitial edema.

## 2020-10-07 ENCOUNTER — Other Ambulatory Visit: Payer: Self-pay | Admitting: Family

## 2020-10-07 DIAGNOSIS — E559 Vitamin D deficiency, unspecified: Secondary | ICD-10-CM

## 2020-10-07 DIAGNOSIS — E2839 Other primary ovarian failure: Secondary | ICD-10-CM

## 2020-10-14 ENCOUNTER — Emergency Department (HOSPITAL_COMMUNITY): Payer: Medicare Other

## 2020-10-14 ENCOUNTER — Emergency Department (HOSPITAL_COMMUNITY)
Admission: EM | Admit: 2020-10-14 | Discharge: 2020-10-14 | Disposition: A | Discharge status: Home / Self Care | Payer: Medicare Other | Attending: Emergency Medicine | Admitting: Emergency Medicine

## 2020-10-14 ENCOUNTER — Encounter (HOSPITAL_COMMUNITY): Payer: Self-pay | Admitting: Emergency Medicine

## 2020-10-14 ENCOUNTER — Other Ambulatory Visit: Payer: Self-pay

## 2020-10-14 DIAGNOSIS — I110 Hypertensive heart disease with heart failure: Secondary | ICD-10-CM | POA: Insufficient documentation

## 2020-10-14 DIAGNOSIS — Z7982 Long term (current) use of aspirin: Secondary | ICD-10-CM | POA: Insufficient documentation

## 2020-10-14 DIAGNOSIS — J45909 Unspecified asthma, uncomplicated: Secondary | ICD-10-CM | POA: Diagnosis not present

## 2020-10-14 DIAGNOSIS — Z955 Presence of coronary angioplasty implant and graft: Secondary | ICD-10-CM | POA: Diagnosis not present

## 2020-10-14 DIAGNOSIS — R042 Hemoptysis: Secondary | ICD-10-CM | POA: Insufficient documentation

## 2020-10-14 DIAGNOSIS — Z96652 Presence of left artificial knee joint: Secondary | ICD-10-CM | POA: Diagnosis not present

## 2020-10-14 DIAGNOSIS — Z91040 Latex allergy status: Secondary | ICD-10-CM | POA: Diagnosis not present

## 2020-10-14 DIAGNOSIS — R1110 Vomiting, unspecified: Secondary | ICD-10-CM | POA: Insufficient documentation

## 2020-10-14 DIAGNOSIS — R0789 Other chest pain: Secondary | ICD-10-CM | POA: Diagnosis not present

## 2020-10-14 DIAGNOSIS — R059 Cough, unspecified: Secondary | ICD-10-CM | POA: Insufficient documentation

## 2020-10-14 DIAGNOSIS — Z85830 Personal history of malignant neoplasm of bone: Secondary | ICD-10-CM | POA: Diagnosis not present

## 2020-10-14 DIAGNOSIS — Z96642 Presence of left artificial hip joint: Secondary | ICD-10-CM | POA: Diagnosis not present

## 2020-10-14 DIAGNOSIS — I509 Heart failure, unspecified: Secondary | ICD-10-CM | POA: Insufficient documentation

## 2020-10-14 DIAGNOSIS — Z87891 Personal history of nicotine dependence: Secondary | ICD-10-CM | POA: Insufficient documentation

## 2020-10-14 DIAGNOSIS — E119 Type 2 diabetes mellitus without complications: Secondary | ICD-10-CM | POA: Diagnosis not present

## 2020-10-14 DIAGNOSIS — Z794 Long term (current) use of insulin: Secondary | ICD-10-CM | POA: Insufficient documentation

## 2020-10-14 DIAGNOSIS — Z79899 Other long term (current) drug therapy: Secondary | ICD-10-CM | POA: Diagnosis not present

## 2020-10-14 LAB — CBC WITH DIFFERENTIAL/PLATELET
Abs Immature Granulocytes: 0.02 10*3/uL (ref 0.00–0.07)
Basophils Absolute: 0 10*3/uL (ref 0.0–0.1)
Basophils Relative: 1 %
Eosinophils Absolute: 0.2 10*3/uL (ref 0.0–0.5)
Eosinophils Relative: 3 %
HCT: 40.4 % (ref 36.0–46.0)
Hemoglobin: 13.2 g/dL (ref 12.0–15.0)
Immature Granulocytes: 0 %
Lymphocytes Relative: 43 %
Lymphs Abs: 2.8 10*3/uL (ref 0.7–4.0)
MCH: 29 pg (ref 26.0–34.0)
MCHC: 32.7 g/dL (ref 30.0–36.0)
MCV: 88.8 fL (ref 80.0–100.0)
Monocytes Absolute: 0.5 10*3/uL (ref 0.1–1.0)
Monocytes Relative: 8 %
Neutro Abs: 3 10*3/uL (ref 1.7–7.7)
Neutrophils Relative %: 45 %
Platelets: 226 10*3/uL (ref 150–400)
RBC: 4.55 MIL/uL (ref 3.87–5.11)
RDW: 13.1 % (ref 11.5–15.5)
WBC: 6.6 10*3/uL (ref 4.0–10.5)
nRBC: 0 % (ref 0.0–0.2)

## 2020-10-14 LAB — BASIC METABOLIC PANEL
Anion gap: 9 (ref 5–15)
BUN: 14 mg/dL (ref 6–20)
CO2: 30 mmol/L (ref 22–32)
Calcium: 9.6 mg/dL (ref 8.9–10.3)
Chloride: 105 mmol/L (ref 98–111)
Creatinine, Ser: 1.22 mg/dL — ABNORMAL HIGH (ref 0.44–1.00)
GFR, Estimated: 51 mL/min — ABNORMAL LOW (ref >60)
Glucose, Bld: 208 mg/dL — ABNORMAL HIGH (ref 70–99)
Potassium: 3.5 mmol/L (ref 3.5–5.1)
Sodium: 144 mmol/L (ref 135–145)

## 2020-10-14 LAB — TROPONIN I (HIGH SENSITIVITY)
Troponin I (High Sensitivity): 7 ng/L (ref <18)
Troponin I (High Sensitivity): 6 ng/L (ref <18)

## 2020-10-14 LAB — D-DIMER, QUANTITATIVE: D-Dimer, Quant: 1.03 ug/mL-FEU — ABNORMAL HIGH (ref 0.00–0.50)

## 2020-10-14 MED ORDER — SODIUM CHLORIDE (PF) 0.9 % IJ SOLN
INTRAMUSCULAR | Status: AC
  Filled 2020-10-14: qty 50

## 2020-10-14 MED ORDER — IOHEXOL 350 MG/ML SOLN
100.0000 mL | Freq: Once | INTRAVENOUS | Status: AC | PRN
  Administered 2020-10-14: 100 mL via INTRAVENOUS

## 2020-10-14 NOTE — ED Triage Notes (Signed)
Pt arriving with chest pain and episodes of coughing up blood. Pt reports this began yesterday. Hx of PEs

## 2020-10-14 NOTE — ED Notes (Signed)
Patient reports chest pain and back pain. Patient has a history of PE's and states this feels similar.

## 2020-10-14 NOTE — ED Provider Notes (Addendum)
  Physical Exam  BP 129/76   Pulse 87   Temp 98.5 F (36.9 C) (Oral)   Resp 19   Ht 4\' 11"  (1.499 m)   Wt 117.9 kg   SpO2 96%   BMI 52.51 kg/m   Physical Exam  ED Course/Procedures     Procedures  MDM  CT angiogram is negative for PE.  The patient has normal vital signs and is not requiring oxygen.  No evidence of massive hemoptysis throughout her ED stay.  Suitable for discharge home with outpatient follow-up.  She was informed of her thyroid nodule and mediastinal mass. No f/u needed.       Arnaldo Natal, MD 10/14/20 [id number]    Arnaldo Natal, MD 10/14/20 [id number]

## 2020-10-14 NOTE — ED Provider Notes (Signed)
[facility] Provider Note: Tara Spurling, MD, FACEP  CSN: [id number] MRN: [id number] ARRIVAL: 10/14/20 at [place]: [facility]  Hemoptysis and Chest Pain   HISTORY OF PRESENT ILLNESS  10/14/20 5:55 AM [name] is a 60 y.o. female who has had pain in her lower chest or epigastric region for the past 2 days, radiating into her back.  The pain is sharp and worse with palpation or deep breathing.  She rates it as an 8 out of 10 currently.  She is not short of breath but has had some vomiting with this.  She became concerned because yesterday she began coughing up which she describes as bloody (not just pain) sputum.  She has not had a fever.  She was noted to become hypoxic while sleeping in the ED but she states this is because she does not have her CPAP.  On my evaluation her oxygen saturation is 97% on room air.   Past Medical History:  Diagnosis Date   Anginal pain ([place])    a. NL cath in 2008;  b. Myoview 03/2011: dec uptake along mid anterior wall on stress imaging -> ? attenuation vs. ischemia, EF 65%;  c. 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 2008 -> coumadin x 6 mos.   Rheumatoid arthritis ([place])    Sleep apnea    on CPAP 02/2018   TIA (transient ischemic attack)    Urinary urgency     Past Surgical History:  Procedure Laterality Date   ABDOMINAL HYSTERECTOMY   2005   APPENDECTOMY     CARDIAC CATHETERIZATION     Normal   CARDIAC CATHETERIZATION N/A 09/30/2014   Procedure: Left Heart Cath and Coronary Angiography;  Surgeon: Sherren Mocha, MD;  Location: [facility];  Service: Cardiovascular;  Laterality: N/A;   LAPAROSCOPIC APPENDECTOMY N/A 06/03/2012   Procedure: APPENDECTOMY LAPAROSCOPIC;  Surgeon: Stark Klein, MD;  Location: [facility];  Service: General;  Laterality: N/A;   Left knee surgery  2008   LEG SURGERY     TONSILLECTOMY     TOTAL HIP ARTHROPLASTY Left 06/11/2018   Procedure: LEFT TOTAL HIP ARTHROPLASTY ANTERIOR APPROACH;  Surgeon: Meredith Pel, MD;  Location: [facility];  Service: Orthopedics;  Laterality: Left;   TOTAL KNEE ARTHROPLASTY Left 08/22/2016   Procedure: TOTAL KNEE ARTHROPLASTY;  Surgeon: Meredith Pel, MD;  Location: [facility];  Service: Orthopedics;  Laterality: Left;   TUBAL LIGATION  1989    Family History  Problem Relation Age of Onset   Emphysema Mother    Arthritis Mother  Heart failure Mother        alive @ 79   Stroke Mother    Diabetes Mother    Hypertension Mother    Hyperlipidemia Mother    Depression Mother    Anxiety disorder Mother    Asthma Brother    Heart disease Father        died @ 35's.   Stroke Father    Diabetes Father    Hyperlipidemia Father    Hypertension Father    Bipolar disorder Father    Sleep apnea Father    Alcoholism Father    Drug abuse Father    Diabetes Sister    Heart disease Paternal Grandfather    Colon cancer Maternal Grandfather    Breast cancer Maternal Aunt     Social History   Tobacco Use   Smoking status: Former    Packs/day: 0.50    Years: 15.00    Pack years: 7.50    Types: Cigarettes    Quit date: 04/24/1985    Years since quitting: 35.4   Smokeless tobacco: Never  Substance Use Topics   Alcohol use: No    Alcohol/week: 0.0 standard drinks   Drug use: Not Currently    Types: Marijuana    Prior to Admission medications   Medication Sig  Start Date End Date Taking? Authorizing Provider  albuterol (PROVENTIL HFA;VENTOLIN HFA) 108 (90 Base) MCG/ACT inhaler Inhale 2 puffs into the lungs every 6 (six) hours as needed for wheezing or shortness of breath.    [provider]  aspirin 81 MG chewable tablet Chew 81 mg by mouth daily.    [provider]  atorvastatin (LIPITOR) 40 MG tablet Take 40 mg by mouth at bedtime.     [provider]  busPIRone (BUSPAR) 30 MG tablet Take 30 mg by mouth 2 (two) times daily. 12/18/19   [provider]  carvedilol (COREG) 12.5 MG tablet Take 12.5 mg by mouth at bedtime. 08/11/19   [provider]  Cholecalciferol (VITAMIN D) 125 MCG (5000 UT) CAPS Take 2 capsules by mouth daily. 10/28/19   Whitmire, Joneen Boers, FNP  cyclobenzaprine (FLEXERIL) 10 MG tablet Take 1 tablet (10 mg total) by mouth at bedtime. 12/21/19   Rayna Sexton, PA-C  diclofenac Sodium (VOLTAREN) 1 % GEL Apply 2 g topically 4 (four) times daily. 09/20/19   [provider]  escitalopram (LEXAPRO) 20 MG tablet Take 20 mg by mouth daily. 02/21/19   [provider]  furosemide (LASIX) 40 MG tablet Take 40 mg by mouth daily.    Rai, Ripudeep K, MD  isosorbide dinitrate (ISORDIL) 20 MG tablet Take 20 mg by mouth 2 (two) times daily.    [provider]  lisinopril (PRINIVIL,ZESTRIL) 20 MG tablet Take 20 mg by mouth daily.  09/21/14   [provider]  loperamide (IMODIUM A-D) 2 MG tablet Take 4 mg by mouth 4 (four) times daily as needed for diarrhea or loose stools.     [provider]  meloxicam (MOBIC) 15 MG tablet Take 15 mg by mouth daily as needed. 12/05/19   [provider]  MYRBETRIQ 25 MG TB24 tablet Take 1 tablet (25 mg total) by mouth daily. 03/13/19   Dennard Nip D, MD  omeprazole (PRILOSEC) 20 MG capsule Take 20 mg by mouth 2 (two) times daily. 10/05/16   [provider]  ondansetron (ZOFRAN) 4 MG tablet Take 1 tablet (4 mg total) by  mouth every 6 (six) hours. 09/03/20  Blanchie Dessert, MD  potassium chloride (KLOR-CON M10) 10 MEQ tablet Take 1 tablet (10 mEq total) by mouth 2 (two) times daily. 05/27/18   Jearld Lesch A, DO  promethazine-dextromethorphan (PROMETHAZINE-DM) 6.25-15 MG/5ML syrup Take 5 mLs by mouth at bedtime as needed for cough. 01/12/20   Jaynee Eagles, PA-C  pseudoephedrine (SUDAFED) 30 MG tablet Take 1 tablet (30 mg total) by mouth every 8 (eight) hours as needed for congestion. 01/12/20   Jaynee Eagles, PA-C  Semaglutide,0.25 or 0.5MG /DOS, (OZEMPIC, 0.25 OR 0.5 MG/DOSE,) 2 MG/1.5ML SOPN Inject 0.5 mg into the skin once a week. DX:E11.69 04/12/20   Opalski, Neoma Laming, DO  tiZANidine (ZANAFLEX) 4 MG tablet Take 4 mg by mouth every 8 (eight) hours as needed. 12/22/19   [provider]  topiramate (TOPAMAX) 25 MG tablet Take 1 tablet (25 mg total) by mouth at bedtime. 04/12/20   Opalski, Neoma Laming, DO  traMADol (ULTRAM) 50 MG tablet Take 1 tablet (50 mg total) by mouth every 6 (six) hours as needed. 11/08/19   Drenda Freeze, MD  traZODone (DESYREL) 50 MG tablet Take 1 tablet by mouth at bedtime. 09/09/19   [provider]    Allergies Penicillins, Hydrocodone-acetaminophen, Latex, and Sulfa antibiotics   REVIEW OF SYSTEMS  Negative except as noted here or in the History of Present Illness.   PHYSICAL EXAMINATION  Initial Vital Signs Blood pressure 135/77, pulse 75, temperature 98.5 F (36.9 C), temperature source Oral, resp. rate 20, height 4\' 11"  (1.499 m), weight 117.9 kg, SpO2 99 %.  Examination General: Well-developed, well-nourished female in no acute distress; appearance consistent with age of record HENT: normocephalic; atraumatic Eyes: Normal appearance Neck: supple Heart: regular rate and rhythm Lungs: clear to auscultation bilaterally Abdomen: soft; nondistended; subxiphoid tenderness; bowel sounds present Extremities: No deformity; full range of motion; pulses  normal Neurologic: Awake, alert and oriented; motor function intact in all extremities and symmetric; no facial droop Skin: Warm and dry Psychiatric: Normal mood and affect   RESULTS  Summary of this visit's results, reviewed and interpreted by myself:   EKG Interpretation  Date/Time:  Thursday October 14 2020 00:56:53 EDT Ventricular Rate:  83 PR Interval:  179 QRS Duration: 123 QT Interval:  389 QTC Calculation: 458 R Axis:   35 Text Interpretation: Sinus rhythm Nonspecific intraventricular conduction delay Borderline repolarization abnormality Minimal ST elevation, anterior leads No significant change was found Confirmed by Shanon Rosser [phone]) on 10/14/2020 1:16:29 AM        Laboratory Studies: Results for orders placed or performed during the hospital encounter of 10/14/20 (from the past 24 hour(s))  CBC with Differential/Platelet     Status: None   Collection Time: 10/14/20  2:12 AM  Result Value Ref Range   WBC 6.6 4.0 - 10.5 K/uL   RBC 4.55 3.87 - 5.11 MIL/uL   Hemoglobin 13.2 12.0 - 15.0 g/dL   HCT 40.4 36.0 - 46.0 %   MCV 88.8 80.0 - 100.0 fL   MCH 29.0 26.0 - 34.0 pg   MCHC 32.7 30.0 - 36.0 g/dL   RDW 13.1 11.5 - 15.5 %   Platelets 226 150 - 400 K/uL   nRBC 0.0 0.0 - 0.2 %   Neutrophils Relative % 45 %   Neutro Abs 3.0 1.7 - 7.7 K/uL   Lymphocytes Relative 43 %   Lymphs Abs 2.8 0.7 - 4.0 K/uL   Monocytes Relative 8 %   Monocytes Absolute 0.5 0.1 - 1.0 K/uL   Eosinophils Relative 3 %  Eosinophils Absolute 0.2 0.0 - 0.5 K/uL   Basophils Relative 1 %   Basophils Absolute 0.0 0.0 - 0.1 K/uL   Immature Granulocytes 0 %   Abs Immature Granulocytes 0.02 0.00 - 0.07 K/uL  D-dimer, quantitative     Status: Abnormal   Collection Time: 10/14/20  2:12 AM  Result Value Ref Range   D-Dimer, Quant 1.03 (H) 0.00 - 0.50 ug/mL-FEU  Troponin I (High Sensitivity)     Status: None   Collection Time: 10/14/20  2:12 AM  Result Value Ref Range   Troponin I (High Sensitivity)  7 <18 ng/L  Basic metabolic panel     Status: Abnormal   Collection Time: 10/14/20  2:12 AM  Result Value Ref Range   Sodium 144 135 - 145 mmol/L   Potassium 3.5 3.5 - 5.1 mmol/L   Chloride 105 98 - 111 mmol/L   CO2 30 22 - 32 mmol/L   Glucose, Bld 208 (H) 70 - 99 mg/dL   BUN 14 6 - 20 mg/dL   Creatinine, Ser 1.22 (H) 0.44 - 1.00 mg/dL   Calcium 9.6 8.9 - 10.3 mg/dL   GFR, Estimated 51 (L) >60 mL/min   Anion gap 9 5 - 15  Troponin I (High Sensitivity)     Status: None   Collection Time: 10/14/20  4:29 AM  Result Value Ref Range   Troponin I (High Sensitivity) 6 <18 ng/L   Imaging Studies: DG Chest 2 View  Result Date: 10/14/2020 CLINICAL DATA:  Chest pain.  Hemoptysis for 3 days EXAM: CHEST - 2 VIEW COMPARISON:  09/03/2020 FINDINGS: Normal heart size and stable mediastinal contours. Borderline congested appearance of central vessels. No edema, effusion, or air bronchogram. Mild scarring seen on the lateral view, stable. IMPRESSION: Borderline vascular congestion.  No pneumonia or pulmonary edema. Electronically Signed   By: Monte Fantasia M.D.   On: 10/14/2020 05:37   CT Angio Chest PE W and/or Wo Contrast  Addendum Date: 10/14/2020   ADDENDUM REPORT: 10/14/2020 08:46 ADDENDUM: The RIGHT thyroid mass was previously assessed by ultrasound on 05/27/2020, with lesion meeting TI-RADS criteria for tissue sampling; please refer to prior thyroid ultrasound report for description. Electronically Signed   By: Lavonia Dana M.D.   On: 10/14/2020 08:46   Result Date: 10/14/2020 CLINICAL DATA:  Chest pain, hemoptysis, onset of symptoms yesterday, elevated D-dimer, question pulmonary embolism EXAM: CT ANGIOGRAPHY CHEST WITH CONTRAST TECHNIQUE: Multidetector CT imaging of the chest was performed using the standard protocol during bolus administration of intravenous contrast. Multiplanar CT image reconstructions and MIPs were obtained to evaluate the vascular anatomy. CONTRAST:  145mL OMNIPAQUE IOHEXOL  350 MG/ML SOLN IV COMPARISON:  09/03/2020 FINDINGS: Cardiovascular: Atherosclerotic calcifications aorta. Aorta normal caliber without aneurysm or dissection. Heart unremarkable. No pericardial effusion. Degradation of image quality secondary to body habitus. Pulmonary arteries adequately opacified with upper normal size of main pulmonary artery. Respiratory motion artifacts at lower lobes. No definite evidence of pulmonary embolism. Mediastinum/Nodes: Esophagus unremarkable. Asymmetric enlargement of the RIGHT thyroid lobe containing a chronic 3.4 x 3.0 cm diameter mass. Additionally, retro manubrial mass 3.0 x 1.8 cm image 43, unchanged since 08/14/2019 and present on earlier studies back to 2004, question exophytic thyroid nodule. Base of cervical region otherwise normal appearance. No thoracic adenopathy. Lungs/Pleura: Minimal subsegmental atelectasis dependently in RIGHT lower lobe and at LEFT lung base. No pulmonary infiltrate, pleural effusion, or pneumothorax. Upper Abdomen: Visualized upper abdomen unremarkable Musculoskeletal: No acute osseous findings. Review of the MIP images  confirms the above findings. IMPRESSION: No definite evidence of pulmonary embolism with exam degraded by respiratory motion artifacts at the lower lobes and body habitus. Stable RIGHT thyroid mass. Stable anterior mediastinal mass, potentially exophytic nodule arising from inferior pole of RIGHT thyroid lobe. Minimal atelectasis in lower lobes. Aortic Atherosclerosis (ICD10-I70.0). Electronically Signed: By: Lavonia Dana M.D. On: 10/14/2020 08:26    ED COURSE and MDM  Nursing notes, initial and subsequent vitals signs, including pulse oximetry, reviewed and interpreted by myself.  Vitals:   10/14/20 0700 10/14/20 0730 10/14/20 0800 10/14/20 0830  BP: (!) 163/84 (!) 162/78 125/71 129/76  Pulse: 76 80 88 87  Resp: (!) 22 20 18 19   Temp:      TempSrc:      SpO2: 95% 98% 95% 96%  Weight:      Height:       Medications   iohexol (OMNIPAQUE) 350 MG/ML injection 100 mL (100 mLs Intravenous Contrast Given 10/14/20 0741)  sodium chloride (PF) 0.9 % injection (  Given by Other 10/14/20 0801)      PROCEDURES  Procedures   ED DIAGNOSES     ICD-10-CM   1. Hemoptysis  R04.2          Malyk Girouard, Jenny Reichmann, MD 10/14/20 2240

## 2020-10-18 ENCOUNTER — Other Ambulatory Visit: Payer: Self-pay | Admitting: Family

## 2020-10-18 DIAGNOSIS — E041 Nontoxic single thyroid nodule: Secondary | ICD-10-CM

## 2020-10-28 ENCOUNTER — Other Ambulatory Visit: Payer: Self-pay

## 2020-10-28 ENCOUNTER — Ambulatory Visit
Admission: RE | Admit: 2020-10-28 | Discharge: 2020-10-28 | Disposition: A | Discharge status: Home / Self Care | Payer: Medicare Other | Source: Ambulatory Visit | Attending: Family | Admitting: Family

## 2020-10-28 DIAGNOSIS — E559 Vitamin D deficiency, unspecified: Secondary | ICD-10-CM

## 2020-10-28 DIAGNOSIS — E2839 Other primary ovarian failure: Secondary | ICD-10-CM

## 2020-11-04 ENCOUNTER — Ambulatory Visit
Admission: RE | Admit: 2020-11-04 | Discharge: 2020-11-04 | Disposition: A | Discharge status: Home / Self Care | Payer: Medicare Other | Source: Ambulatory Visit | Attending: Family | Admitting: Family

## 2020-11-04 ENCOUNTER — Other Ambulatory Visit (HOSPITAL_COMMUNITY)
Admission: RE | Admit: 2020-11-04 | Discharge: 2020-11-04 | Disposition: A | Discharge status: Home / Self Care | Payer: Medicare Other | Source: Ambulatory Visit | Attending: Family | Admitting: Family

## 2020-11-04 DIAGNOSIS — D34 Benign neoplasm of thyroid gland: Secondary | ICD-10-CM | POA: Insufficient documentation

## 2020-11-04 DIAGNOSIS — E041 Nontoxic single thyroid nodule: Secondary | ICD-10-CM | POA: Diagnosis present

## 2020-11-08 LAB — CYTOLOGY - NON PAP

## 2020-11-20 IMAGING — CT CT HIP*L* W/O CM
1 series · 15 of 32 positions shown, 19 images · non-contrast
Comparison: Radiographs dated 06/26/2018 and 10/23/2018 and
12/04/2018

CLINICAL DATA: Left hip pain for 2 months. Left total hip
prosthesis.

EXAM:
CT OF THE LEFT HIP WITHOUT CONTRAST
TECHNIQUE: Multidetector CT imaging of the left hip was performed according to
the standard protocol. Multiplanar CT image reconstructions were
also generated.

[Series 4: soft tissue pelvis/hip · axial · 0.44mm/px · z∈[-156,+105]mm · 15 of 98 slices shown, 19 images]
[im 7/98  soft-tissue]
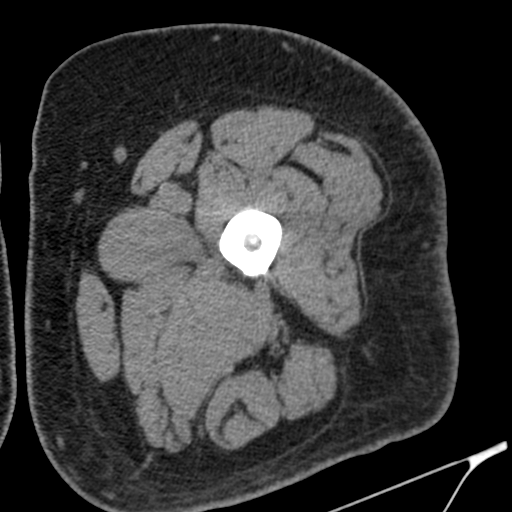
[im 7/98  bone]
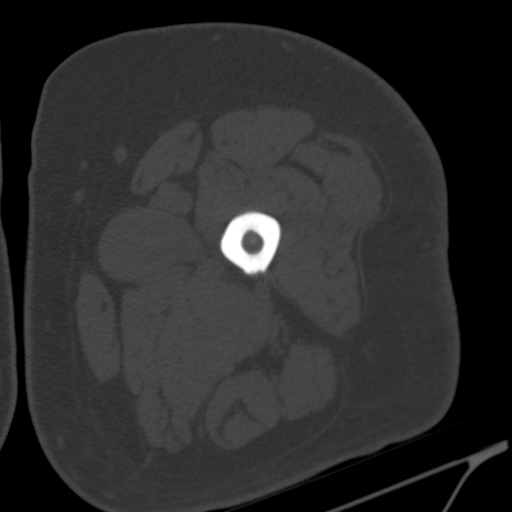
[im 13/98  soft-tissue]
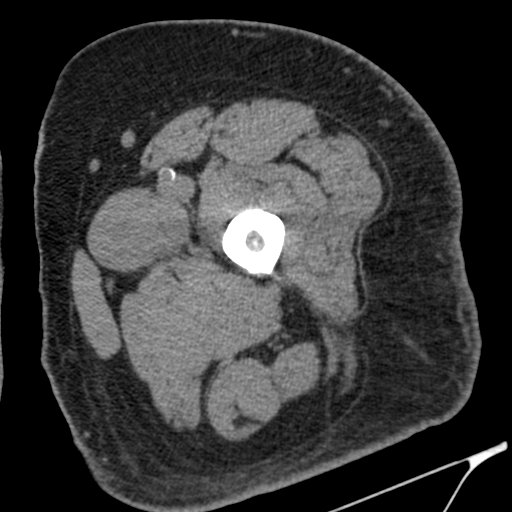
[im 19/98  soft-tissue]
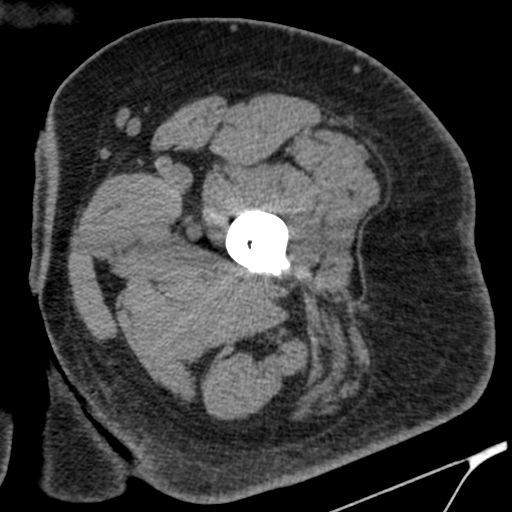
[im 29/98  soft-tissue]
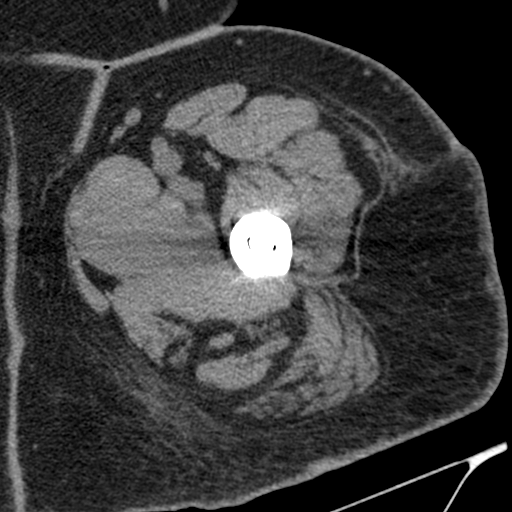
[im 35/98  soft-tissue]
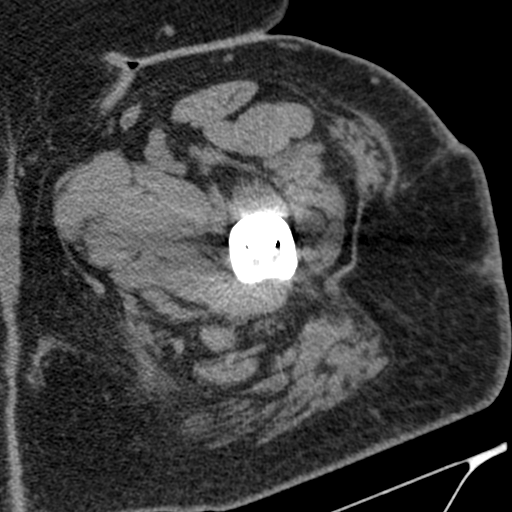
[im 41/98  soft-tissue]
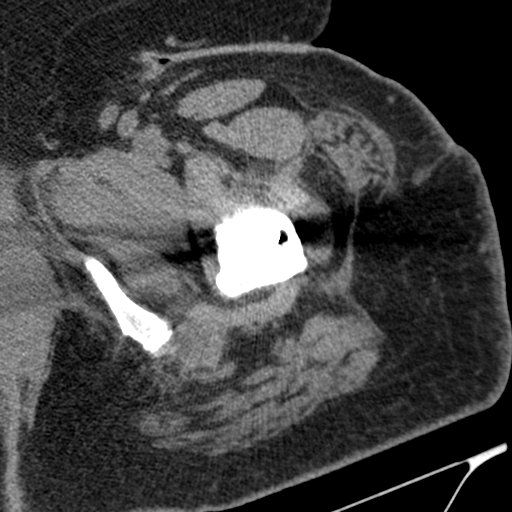
[im 51/98  soft-tissue]
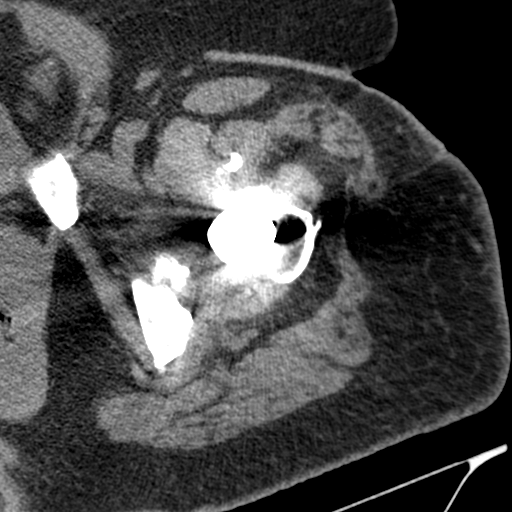
[im 57/98  soft-tissue]
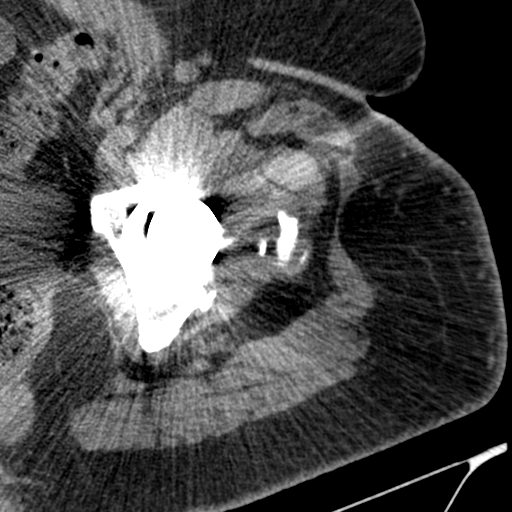
[im 63/98  soft-tissue]
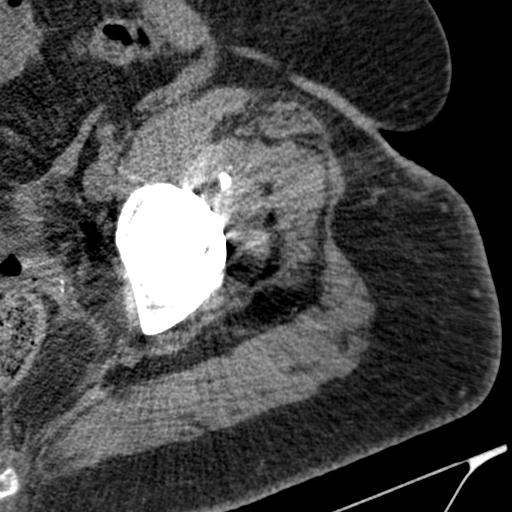
[im 63/98  bone]
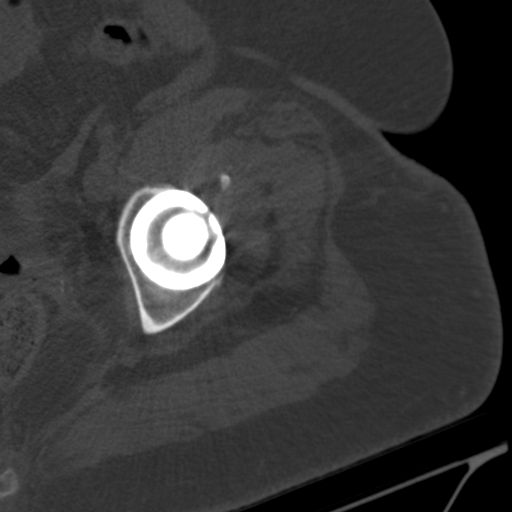
[im 69/98  soft-tissue]
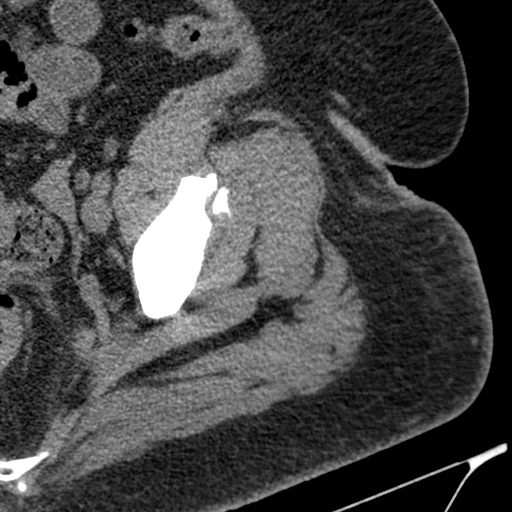
[im 79/98  soft-tissue]
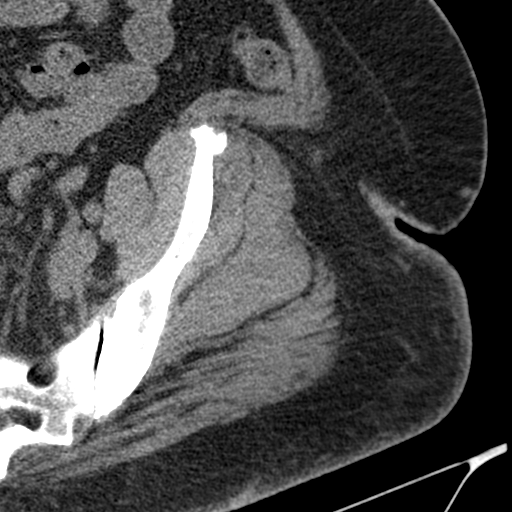
[im 85/98  soft-tissue]
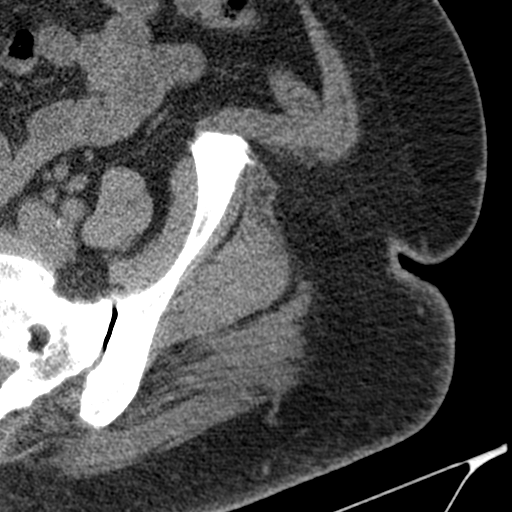
[im 85/98  lung]
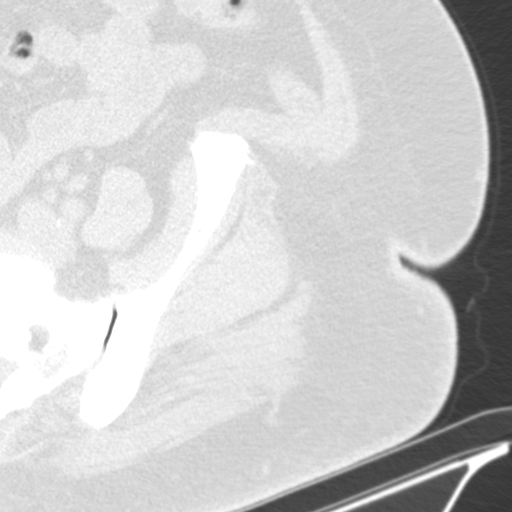
[im 88/98  lung]
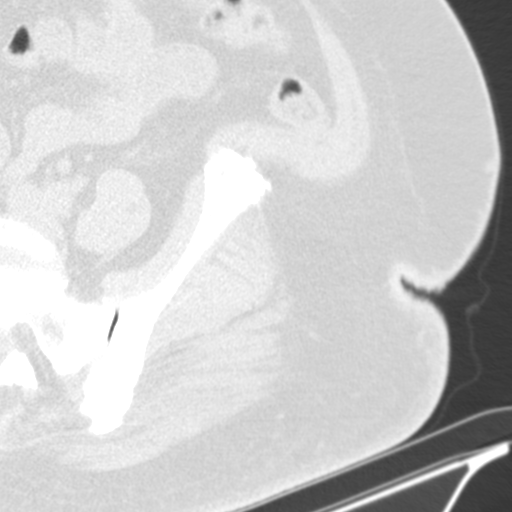
[im 91/98  soft-tissue]
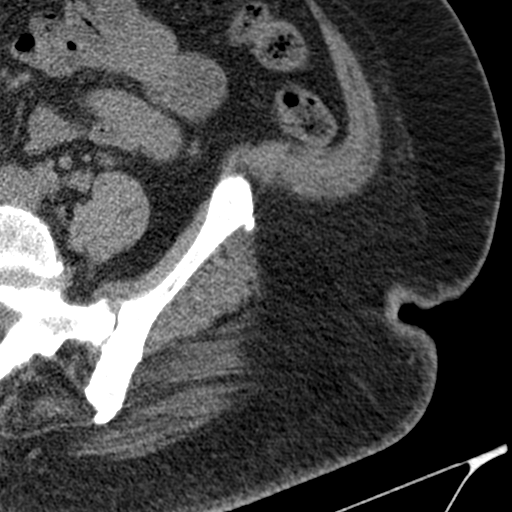
[im 91/98  lung]
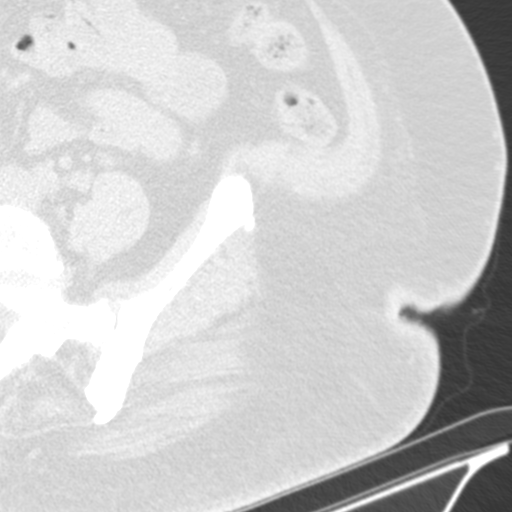
[im 94/98  lung]
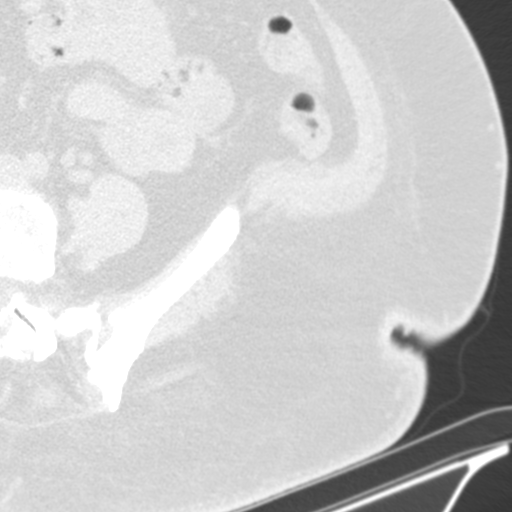

[15 of 32 positions shown; findings below may reference images not displayed]

FINDINGS: Bones/Joint/Cartilage

The acetabular and femoral components of the left hip prosthesis
appear in excellent position with no evidence of loosening. No
appreciable soft tissue mass or joint effusion. There are few
dystrophic calcifications in the soft tissues around the left hip,
not felt to be significant. The patient has prominent sclerosis at
the left SI joint with a vacuum phenomenon. There is also severe
left facet arthritis at L5-S1.

Muscles and Tendons

The muscles and tendons around the left hip appear normal.

Soft tissues

Normal.
IMPRESSION: 1. No significant abnormality of the left hip prosthesis.
2. Severe left sacroiliitis. The visualized portion of the right SI
joint on the lumbar MRI of this same date appears normal.
3. Severe left facet arthritis at L5-S1.

## 2021-01-06 IMAGING — CR DG CHEST 2V
2 series · 2 of 2 positions shown · non-contrast
Comparison: November 18, 2018

CLINICAL DATA: Chest pain

EXAM:
CHEST - 2 VIEW

[chest pa]
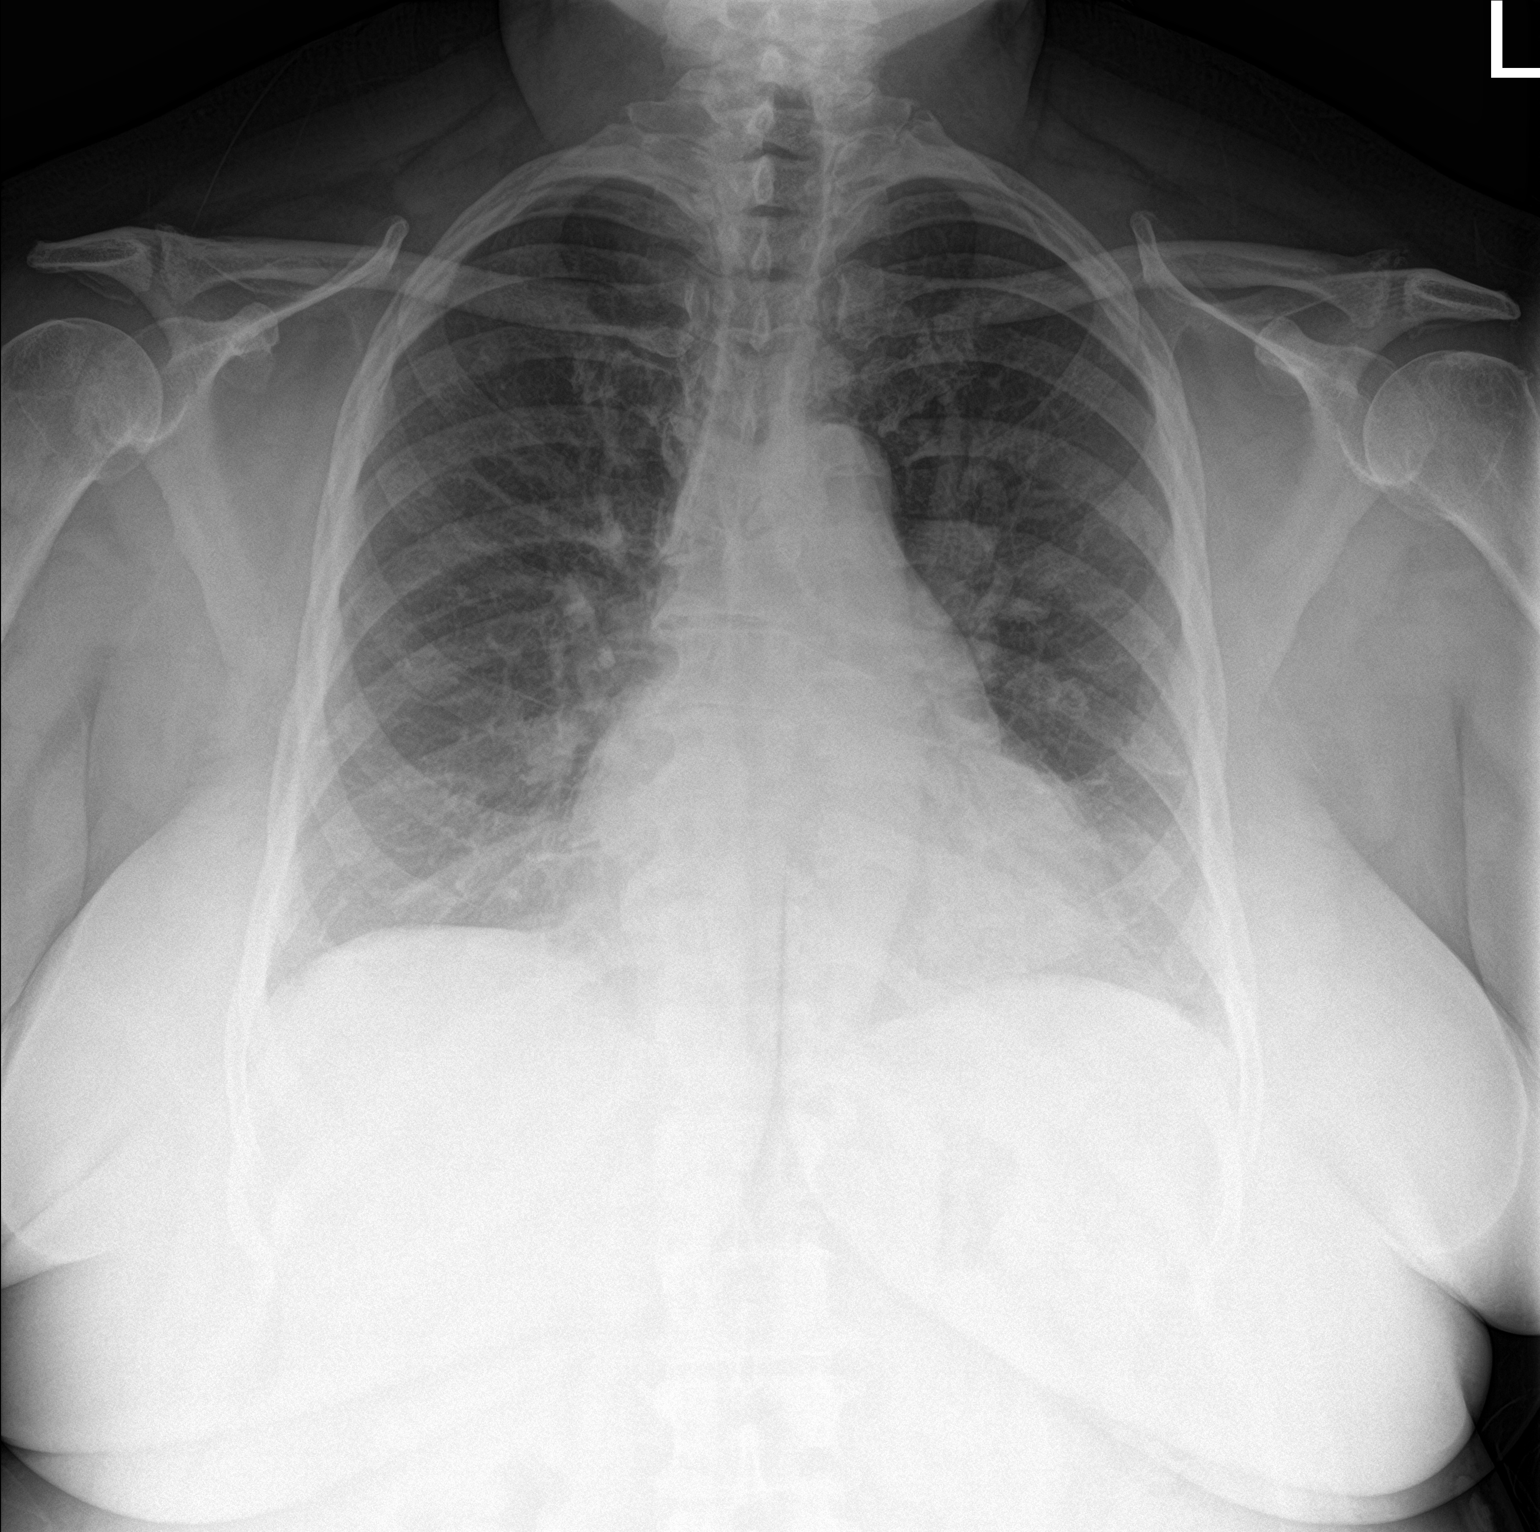

[chest lat]
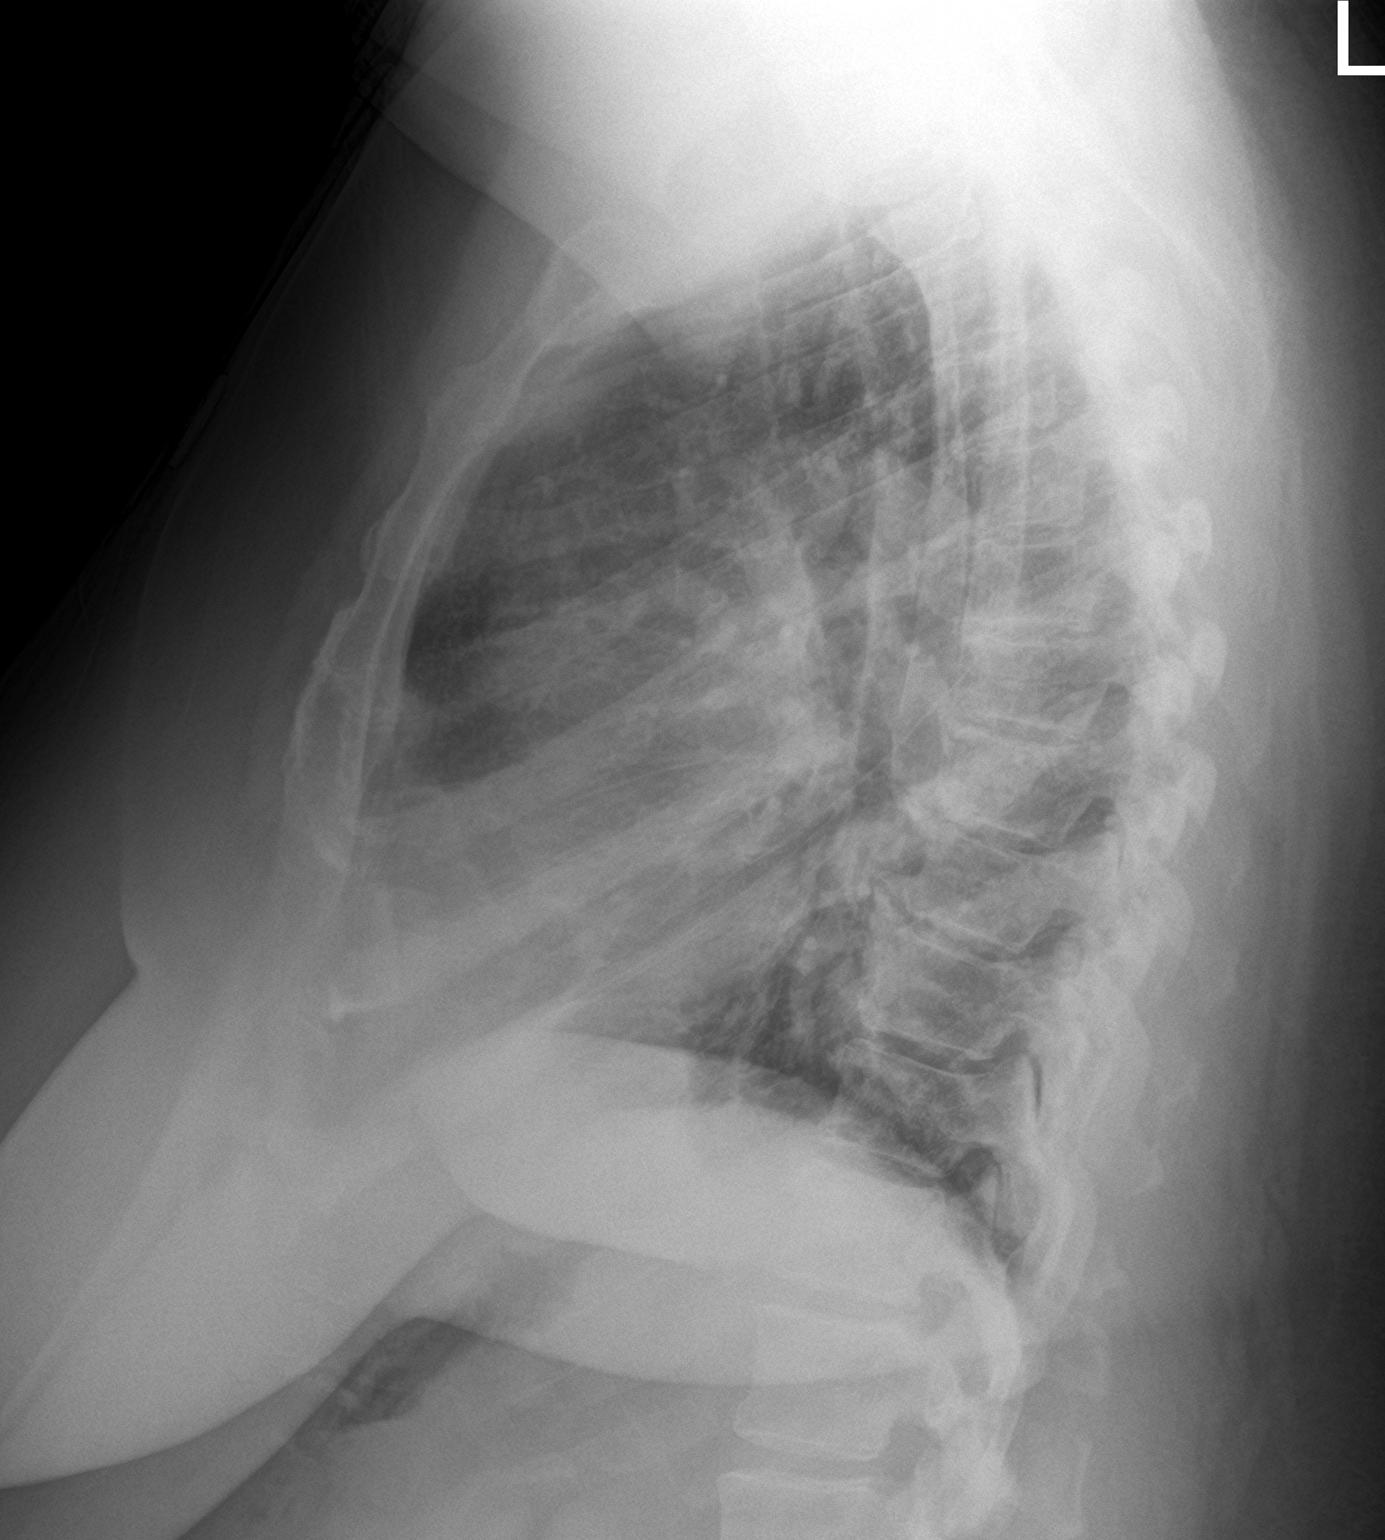

[2 of 2 positions shown; findings below may reference images not displayed]

FINDINGS: There is mild cardiomegaly. There is mild prominence of the central
pulmonary vasculature. No large airspace consolidation or pleural
effusion. No acute osseous abnormality.
IMPRESSION: Mild cardiomegaly and pulmonary vascular congestion.

## 2021-01-07 IMAGING — CT CT ANGIO CHEST
2 of 7 series · 18 of 46 positions shown · IV contrast (APPLIED)
Comparison: 09/02/2018

CLINICAL DATA: Shortness of breath

EXAM:
CT ANGIOGRAPHY CHEST WITH CONTRAST
TECHNIQUE: Multidetector CT imaging of the chest was performed using the
standard protocol during bolus administration of intravenous
contrast. Multiplanar CT image reconstructions and MIPs were
obtained to evaluate the vascular anatomy.
CONTRAST:  100mL OMNIPAQUE IOHEXOL 350 MG/ML SOLN

[Series 8: thins · axial · 0.64mm/px · z∈[-441,-210]mm · 15 of 373 slices shown]
[im 21/373  lung]
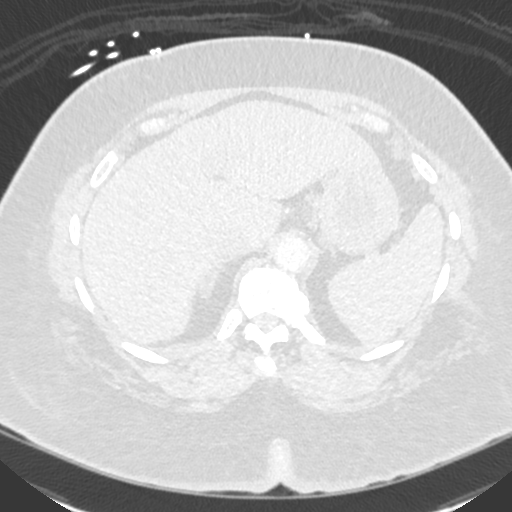
[im 42/373  soft-tissue]
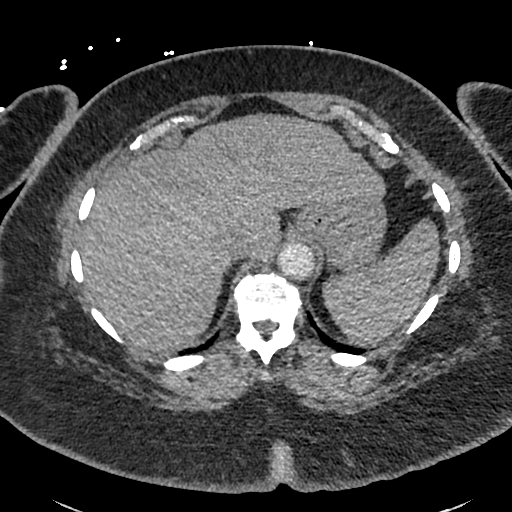
[im 63/373  lung]
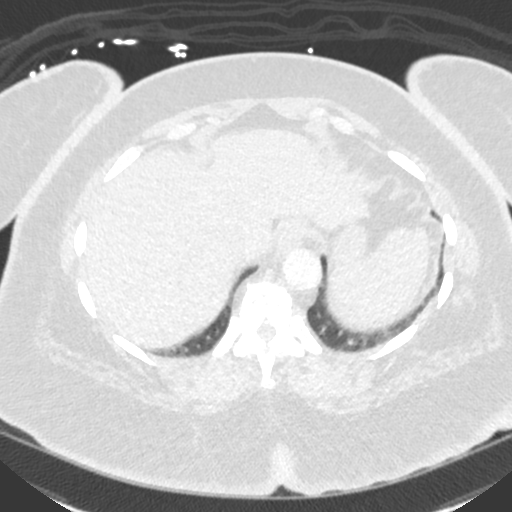
[im 83/373  soft-tissue]
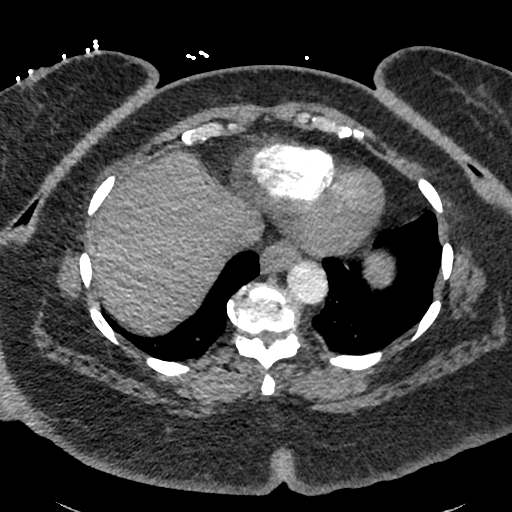
[im 125/373  lung]
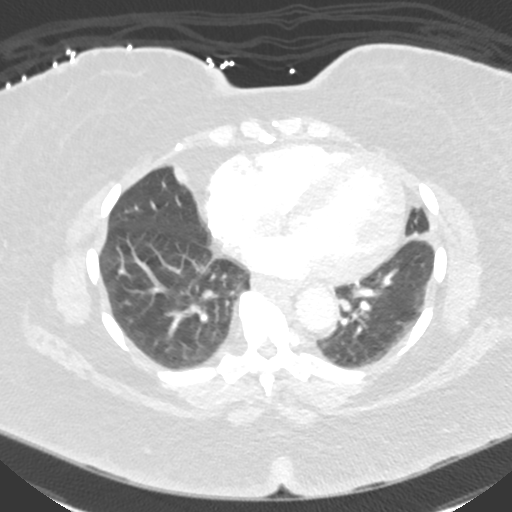
[im 145/373  soft-tissue]
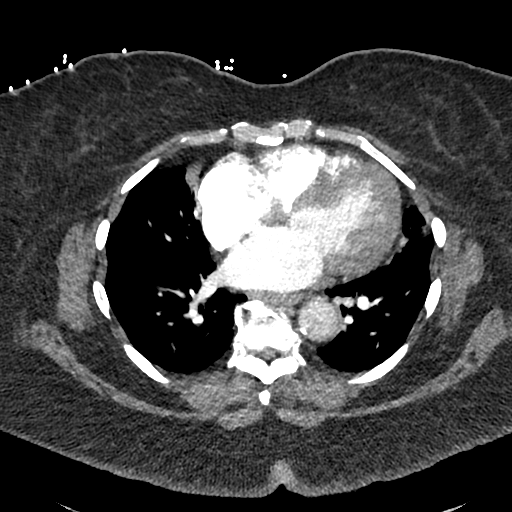
[im 166/373  lung]
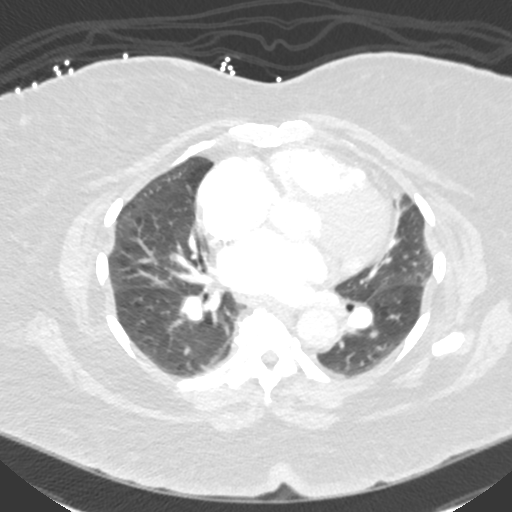
[im 187/373  soft-tissue]
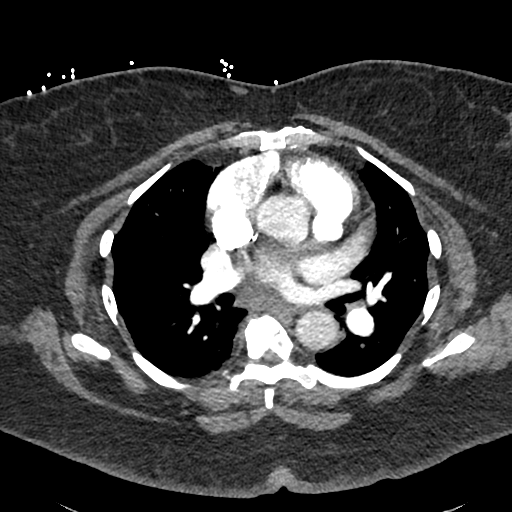
[im 207/373  lung]
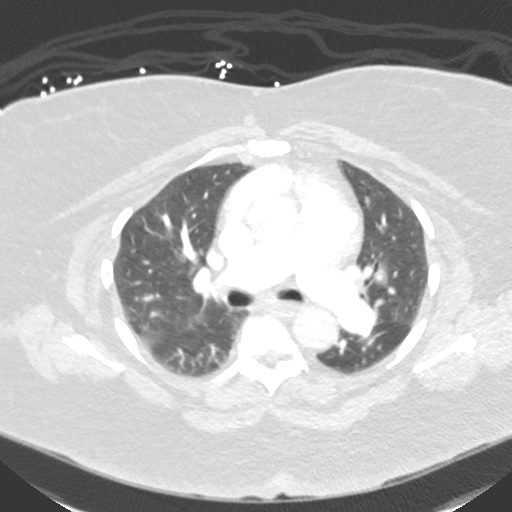
[im 228/373  soft-tissue]
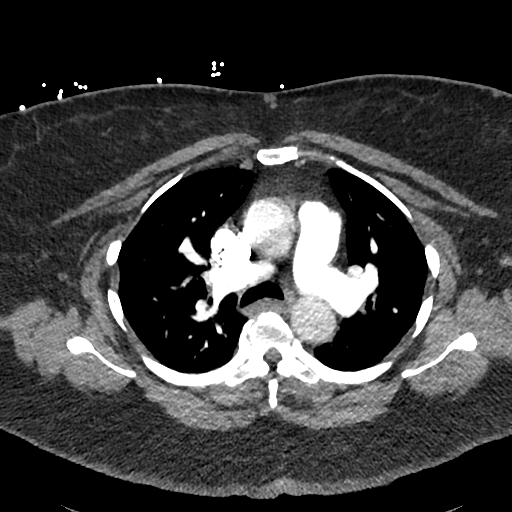
[im 249/373  lung]
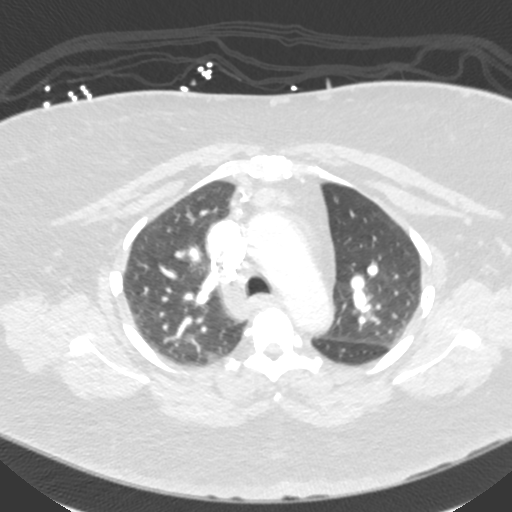
[im 290/373  soft-tissue]
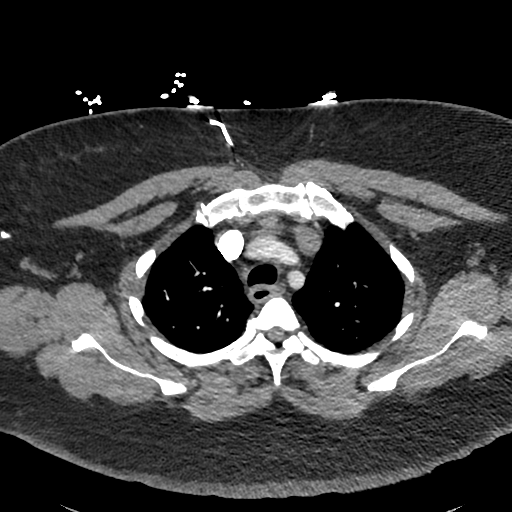
[im 311/373  lung]
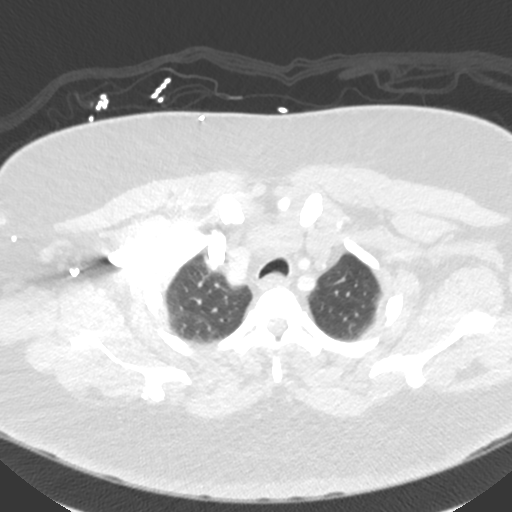
[im 331/373  soft-tissue]
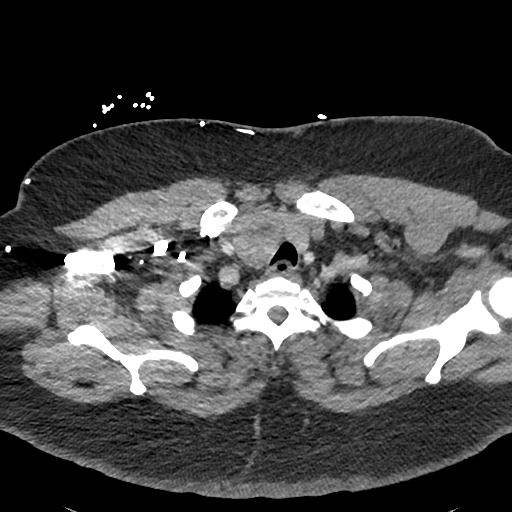
[im 352/373  lung]
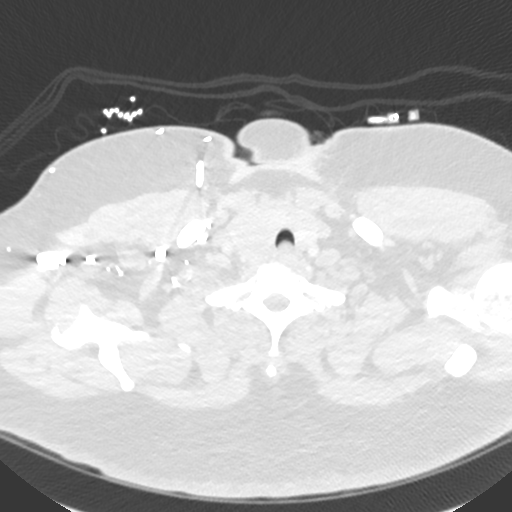

[Series 9: cor · coronal · 0.52mm/px · 3 of 139 slices shown]
[im 35/139  soft-tissue]
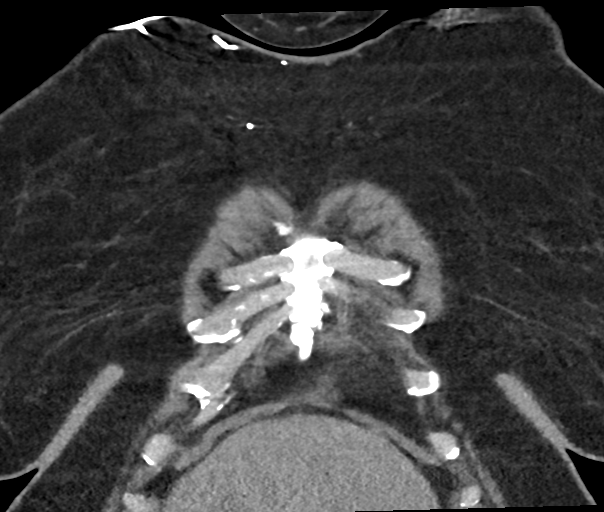
[im 70/139  soft-tissue]
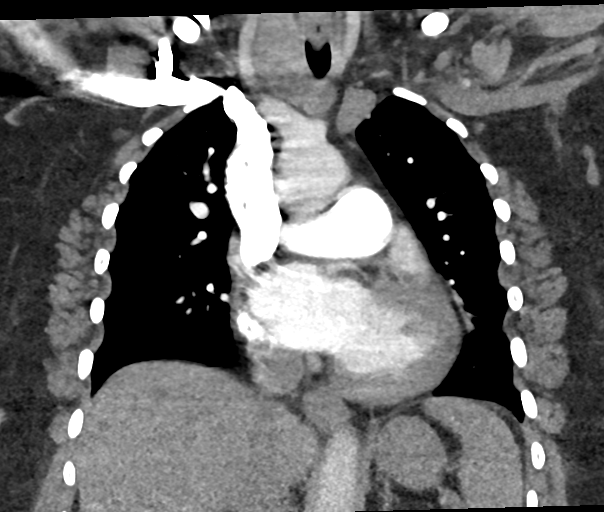
[im 104/139  soft-tissue]
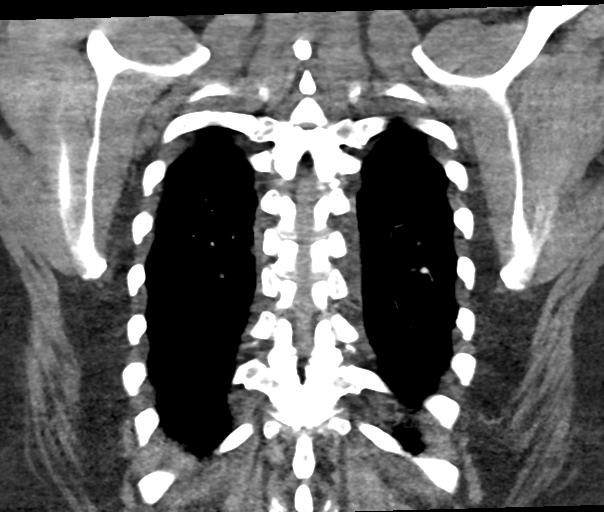

[18 of 46 positions shown; findings below may reference images not displayed]

FINDINGS: Cardiovascular: Satisfactory opacification of the pulmonary arteries
to the segmental level. No evidence of pulmonary embolism.
Borderline enlargement of the pulmonary trunk, similar to prior,
suggesting underlying pulmonary arterial hypertension. Stable mild
cardiomegaly. No pericardial effusion.

Mediastinum/Nodes: Enlarged, heterogeneous right thyroid lobe with
extension to the superior mediastinum, unchanged. Well-circumscribed
2.6 x 1.6 cm soft tissue mass in the prevascular space is unchanged
from prior. No axillary or hilar lymphadenopathy. Trachea and
esophagus within normal limits.

Lungs/Pleura: Respiratory motion artifact degrades evaluation. Mild
atelectasis within the lingula and right middle lobe. No focal
airspace consolidation, pleural effusion, or pneumothorax.

Upper Abdomen: No acute abnormality.

Musculoskeletal: No chest wall abnormality. No acute or significant
osseous findings.

Review of the MIP images confirms the above findings.
IMPRESSION: 1. Negative for pulmonary embolism.
2. Mild atelectasis within the lingula and right middle lobe. Lungs
otherwise clear.
3. Borderline enlargement of the pulmonary trunk suggesting
underlying pulmonary arterial hypertension.
4. Stable benign 2.6 cm soft tissue mass in the prevascular space,
which has been present on studies dating back to 9226.
5. Unchanged right thyroid goiter.
6. Stable cardiomegaly.

## 2021-01-13 ENCOUNTER — Other Ambulatory Visit: Payer: Self-pay | Admitting: General Surgery

## 2021-01-13 ENCOUNTER — Other Ambulatory Visit: Payer: Self-pay

## 2021-01-13 ENCOUNTER — Other Ambulatory Visit: Payer: Self-pay | Admitting: Family Medicine

## 2021-01-13 ENCOUNTER — Ambulatory Visit
Admission: RE | Admit: 2021-01-13 | Discharge: 2021-01-13 | Disposition: A | Discharge status: Home / Self Care | Payer: Medicare Other | Source: Ambulatory Visit | Attending: Family Medicine | Admitting: Family Medicine

## 2021-01-13 DIAGNOSIS — R52 Pain, unspecified: Secondary | ICD-10-CM

## 2021-03-01 ENCOUNTER — Other Ambulatory Visit (INDEPENDENT_AMBULATORY_CARE_PROVIDER_SITE_OTHER): Payer: Self-pay | Admitting: Family Medicine

## 2021-03-01 DIAGNOSIS — E1169 Type 2 diabetes mellitus with other specified complication: Secondary | ICD-10-CM

## 2021-03-07 ENCOUNTER — Emergency Department (HOSPITAL_BASED_OUTPATIENT_CLINIC_OR_DEPARTMENT_OTHER): Payer: Medicare Other

## 2021-03-07 ENCOUNTER — Encounter (HOSPITAL_BASED_OUTPATIENT_CLINIC_OR_DEPARTMENT_OTHER): Payer: Self-pay | Admitting: Emergency Medicine

## 2021-03-07 ENCOUNTER — Other Ambulatory Visit: Payer: Self-pay

## 2021-03-07 ENCOUNTER — Ambulatory Visit
Admission: EM | Admit: 2021-03-07 | Discharge: 2021-03-07 | Disposition: A | Discharge status: Home / Self Care | Payer: Medicare Other | Attending: Student | Admitting: Student

## 2021-03-07 ENCOUNTER — Encounter: Payer: Self-pay | Admitting: Emergency Medicine

## 2021-03-07 ENCOUNTER — Observation Stay (HOSPITAL_BASED_OUTPATIENT_CLINIC_OR_DEPARTMENT_OTHER)
Admission: EM | Admit: 2021-03-07 | Discharge: 2021-03-09 | Disposition: A | Discharge status: Home / Self Care | Payer: Medicare Other | Attending: Internal Medicine | Admitting: Internal Medicine

## 2021-03-07 ENCOUNTER — Emergency Department (HOSPITAL_BASED_OUTPATIENT_CLINIC_OR_DEPARTMENT_OTHER): Payer: Medicare Other | Admitting: Radiology

## 2021-03-07 DIAGNOSIS — R072 Precordial pain: Principal | ICD-10-CM | POA: Insufficient documentation

## 2021-03-07 DIAGNOSIS — Z20822 Contact with and (suspected) exposure to covid-19: Secondary | ICD-10-CM | POA: Diagnosis not present

## 2021-03-07 DIAGNOSIS — J45909 Unspecified asthma, uncomplicated: Secondary | ICD-10-CM | POA: Diagnosis not present

## 2021-03-07 DIAGNOSIS — Z7982 Long term (current) use of aspirin: Secondary | ICD-10-CM | POA: Insufficient documentation

## 2021-03-07 DIAGNOSIS — Z8673 Personal history of transient ischemic attack (TIA), and cerebral infarction without residual deficits: Secondary | ICD-10-CM | POA: Insufficient documentation

## 2021-03-07 DIAGNOSIS — Z85830 Personal history of malignant neoplasm of bone: Secondary | ICD-10-CM | POA: Insufficient documentation

## 2021-03-07 DIAGNOSIS — Z79899 Other long term (current) drug therapy: Secondary | ICD-10-CM | POA: Insufficient documentation

## 2021-03-07 DIAGNOSIS — E119 Type 2 diabetes mellitus without complications: Secondary | ICD-10-CM | POA: Insufficient documentation

## 2021-03-07 DIAGNOSIS — I5032 Chronic diastolic (congestive) heart failure: Secondary | ICD-10-CM | POA: Diagnosis not present

## 2021-03-07 DIAGNOSIS — I2510 Atherosclerotic heart disease of native coronary artery without angina pectoris: Secondary | ICD-10-CM | POA: Diagnosis not present

## 2021-03-07 DIAGNOSIS — Z87891 Personal history of nicotine dependence: Secondary | ICD-10-CM | POA: Diagnosis not present

## 2021-03-07 DIAGNOSIS — R0789 Other chest pain: Secondary | ICD-10-CM | POA: Diagnosis not present

## 2021-03-07 DIAGNOSIS — K219 Gastro-esophageal reflux disease without esophagitis: Secondary | ICD-10-CM

## 2021-03-07 DIAGNOSIS — Z96642 Presence of left artificial hip joint: Secondary | ICD-10-CM | POA: Insufficient documentation

## 2021-03-07 DIAGNOSIS — Z96652 Presence of left artificial knee joint: Secondary | ICD-10-CM | POA: Insufficient documentation

## 2021-03-07 DIAGNOSIS — Z91040 Latex allergy status: Secondary | ICD-10-CM | POA: Insufficient documentation

## 2021-03-07 DIAGNOSIS — I110 Hypertensive heart disease with heart failure: Secondary | ICD-10-CM | POA: Insufficient documentation

## 2021-03-07 DIAGNOSIS — R079 Chest pain, unspecified: Secondary | ICD-10-CM

## 2021-03-07 LAB — CBC
HCT: 37.9 % (ref 36.0–46.0)
Hemoglobin: 12.4 g/dL (ref 12.0–15.0)
MCH: 29 pg (ref 26.0–34.0)
MCHC: 32.7 g/dL (ref 30.0–36.0)
MCV: 88.6 fL (ref 80.0–100.0)
Platelets: 210 10*3/uL (ref 150–400)
RBC: 4.28 MIL/uL (ref 3.87–5.11)
RDW: 12.8 % (ref 11.5–15.5)
WBC: 6.2 10*3/uL (ref 4.0–10.5)
nRBC: 0 % (ref 0.0–0.2)

## 2021-03-07 LAB — COMPREHENSIVE METABOLIC PANEL
ALT: 36 U/L (ref 0–44)
AST: 22 U/L (ref 15–41)
Albumin: 4.1 g/dL (ref 3.5–5.0)
Alkaline Phosphatase: 51 U/L (ref 38–126)
Anion gap: 9 (ref 5–15)
BUN: 13 mg/dL (ref 8–23)
CO2: 30 mmol/L (ref 22–32)
Calcium: 9.2 mg/dL (ref 8.9–10.3)
Chloride: 103 mmol/L (ref 98–111)
Creatinine, Ser: 1.03 mg/dL — ABNORMAL HIGH (ref 0.44–1.00)
GFR, Estimated: >60 mL/min (ref >60)
Glucose, Bld: 245 mg/dL — ABNORMAL HIGH (ref 70–99)
Potassium: 3.5 mmol/L (ref 3.5–5.1)
Sodium: 142 mmol/L (ref 135–145)
Total Bilirubin: 0.5 mg/dL (ref 0.3–1.2)
Total Protein: 6.5 g/dL (ref 6.5–8.1)

## 2021-03-07 LAB — TROPONIN I (HIGH SENSITIVITY)
Troponin I (High Sensitivity): 10 ng/L (ref <18)
Troponin I (High Sensitivity): 8 ng/L (ref <18)

## 2021-03-07 LAB — RESP PANEL BY RT-PCR (FLU A&B, COVID) ARPGX2
Influenza A by PCR: NEGATIVE
Influenza B by PCR: NEGATIVE
SARS Coronavirus 2 by RT PCR: NEGATIVE

## 2021-03-07 LAB — BRAIN NATRIURETIC PEPTIDE: B Natriuretic Peptide: 50.1 pg/mL (ref 0.0–100.0)

## 2021-03-07 MED ORDER — ASPIRIN 81 MG PO CHEW
243.0000 mg | CHEWABLE_TABLET | Freq: Once | ORAL | Status: AC
  Administered 2021-03-08: 243 mg via ORAL
  Filled 2021-03-07: qty 3

## 2021-03-07 MED ORDER — IOHEXOL 350 MG/ML SOLN
100.0000 mL | Freq: Once | INTRAVENOUS | Status: AC | PRN
  Administered 2021-03-07: 100 mL via INTRAVENOUS

## 2021-03-07 NOTE — Discharge Instructions (Addendum)
-  Please head straight to the emergency department for further evaluation and management of your chest pain.  I am concerned that this is a heart issue given the EKG changes and your symptoms.  Please head straight there, make sure your daughter drives the vehicle.  If symptoms get worse on the way, like chest pain, shortness of breath, dizziness, loss of consciousness-stop and call 911 immediately.

## 2021-03-07 NOTE — ED Provider Notes (Addendum)
[facility]    CSN: [id number] Arrival date & time: 03/07/21  1625      History   Chief Complaint Chief Complaint  Patient presents with   Chest Pain   Fatigue    HPI [name] is a 60 y.o. female presenting with about 5 days of progressively worsening shortness of breath, chest pain.  Medical history CHF, mediastinal mass, pneumonia, PE, pulmonary edema, TIA.  Presenting today with about 5 days of progressively worsening shortness of breath and left-sided chest pain, worse with exertion.  Describes the chest pain as left-sided with some radiation down the arm.  Also sensation of indigestion and malaise.  States that she has gained 3 pounds in the last few days, despite taking her Lasix 30 mg daily as directed.  Notes left lower leg edema.  States that the pulmonary embolism occurred about 8 years ago and was unprovoked.  Denies recent trauma or immobilization.  HPI  Past Medical History:  Diagnosis Date   Anginal pain ([place])    a. NL cath in 2008;  b. Myoview 03/2011: dec uptake along mid anterior wall on stress imaging -> ? attenuation vs. ischemia, EF 65%;  c. 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 2008 -> coumadin x 6 mos.   Rheumatoid arthritis ([place])    Sleep apnea    on CPAP 02/2018   TIA (transient ischemic attack)    Urinary urgency     Patient Active Problem List    Diagnosis Date Noted   At risk for impaired metabolic function 60/73/7106   Unilateral primary osteoarthritis, left hip    Hip arthritis 06/11/2018   Class 3 severe obesity with serious comorbidity and body mass index (BMI) of 40.0 to 44.9 in adult [facility]) 05/29/2018   Other fatigue 11/20/2017   Shortness of breath on exertion 11/20/2017   Diabetes mellitus ([place]) 11/20/2017   Vitamin D deficiency 11/20/2017   Depression 11/20/2017   Other hyperlipidemia 11/20/2017   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/04/2016   Presence of left artificial knee joint 09/20/2016   Arthritis of knee 08/22/2016   Cervical radiculopathy 07/26/2016   Cervical disc disorder with radiculopathy 07/26/2016   Right arm pain 06/29/2016   Cervicalgia 06/29/2016   Primary osteoarthritis of left knee 06/29/2016   Hypersomnia with sleep apnea 11/18/2015   Lethargy 11/18/2015   Chest pain 09/30/2014   Abnormal cardiac function test 09/29/2014   Chest pain with moderate risk for cardiac etiology 09/28/2014   HTN (hypertension) 09/28/2014   Hypokalemia    Paresthesia 06/18/2014   Numbness and tingling of left arm and leg  Unstable angina ([place]) 05/24/2014   OSA on CPAP 05/24/2014   CAD (coronary artery disease) 11/25/2012   S/P laparoscopic appendectomy 24/26/8341   Diastolic CHF ([place]) 96/22/2979   Mediastinal mass 01/09/2012   SOB (shortness of breath) 06/20/2011   Mediastinal abnormality 06/20/2011   Dyslipidemia 12/23/2009   GLAUCOMA 12/23/2009   ARTHRITIS 12/23/2009   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   Class 2 severe obesity with serious comorbidity and body mass index (BMI) of 38.0 to 38.9 in adult ([place]) 09/13/2006   ANXIETY STATE NOS 09/13/2006   DISORDER, DEPRESSIVE NEC 09/13/2006   CARPAL TUNNEL SYNDROME, MILD 09/13/2006   Unspecified essential hypertension 09/13/2006   IBS 09/13/2006   DEGENERATION, LUMBAR/LUMBOSACRAL DISC 09/13/2006   SYMPTOM, SWELLING/MASS/LUMP IN CHEST 09/13/2006   PULMONARY EMBOLISM, HX  OF 09/13/2006    Past Surgical History:  Procedure Laterality Date   ABDOMINAL HYSTERECTOMY  2005   APPENDECTOMY     CARDIAC CATHETERIZATION     Normal   CARDIAC CATHETERIZATION N/A 09/30/2014   Procedure: Left Heart Cath and Coronary Angiography;  Surgeon: Sherren Mocha, MD;  Location: [facility];  Service: Cardiovascular;  Laterality: N/A;   LAPAROSCOPIC APPENDECTOMY N/A 06/03/2012   Procedure: APPENDECTOMY LAPAROSCOPIC;  Surgeon: Stark Klein, MD;  Location: [facility];  Service: General;  Laterality: N/A;   Left knee surgery  2008   LEG SURGERY     TONSILLECTOMY     TOTAL HIP ARTHROPLASTY Left 06/11/2018   Procedure: LEFT TOTAL HIP ARTHROPLASTY ANTERIOR APPROACH;  Surgeon: Meredith Pel, MD;  Location: [facility];  Service: Orthopedics;  Laterality: Left;   TOTAL KNEE ARTHROPLASTY Left 08/22/2016   Procedure: TOTAL KNEE ARTHROPLASTY;  Surgeon: Meredith Pel, MD;  Location: [facility];  Service: Orthopedics;  Laterality: Left;   TUBAL LIGATION  1989    OB History     Gravida  3   Para      Term      Preterm      AB      Living         SAB      IAB      Ectopic      Multiple      Live Births               Home Medications    Prior to Admission medications   Medication Sig Start Date End Date Taking? Authorizing Provider  albuterol (PROVENTIL HFA;VENTOLIN HFA) 108 (90 Base) MCG/ACT inhaler Inhale 2 puffs into the lungs every 6 (six) hours as needed for wheezing or shortness of breath.    [provider]  aspirin 81 MG chewable tablet Chew 81 mg by mouth daily.    [provider]  atorvastatin (LIPITOR) 40 MG tablet Take 40 mg by mouth at bedtime.     [provider]  busPIRone (BUSPAR) 30 MG tablet Take 30 mg by mouth 2 (two) times daily. 12/18/19   [provider]  carvedilol (COREG) 12.5 MG tablet Take 12.5 mg by mouth at bedtime. 08/11/19   [provider]  Cholecalciferol (VITAMIN D) 125 MCG (5000 UT) CAPS  Take 2 capsules by mouth daily. 10/28/19   [place], Joneen Boers, FNP  cyclobenzaprine (FLEXERIL) 10 MG tablet Take 1 tablet (10 mg total) by mouth at bedtime. 12/21/19   Rayna Sexton, PA-C  diclofenac Sodium (VOLTAREN) 1 % GEL Apply 2 g topically 4 (four) times daily. 09/20/19   [provider]  escitalopram (LEXAPRO) 20 MG tablet Take  20 mg by mouth daily. 02/21/19   [provider]  furosemide (LASIX) 40 MG tablet Take 40 mg by mouth daily.    Rai, Ripudeep K, MD  isosorbide dinitrate (ISORDIL) 20 MG tablet Take 20 mg by mouth 2 (two) times daily.    [provider]  lisinopril (PRINIVIL,ZESTRIL) 20 MG tablet Take 20 mg by mouth daily.  09/21/14   [provider]  loperamide (IMODIUM A-D) 2 MG tablet Take 4 mg by mouth 4 (four) times daily as needed for diarrhea or loose stools.     [provider]  meloxicam (MOBIC) 15 MG tablet Take 15 mg by mouth daily as needed. 12/05/19   [provider]  MYRBETRIQ 25 MG TB24 tablet Take 1 tablet (25 mg total) by mouth daily. 03/13/19   Dennard Nip D, MD  omeprazole (PRILOSEC) 20 MG capsule Take 20 mg by mouth 2 (two) times daily. 10/05/16   [provider]  ondansetron (ZOFRAN) 4 MG tablet Take 1 tablet (4 mg total) by mouth every 6 (six) hours. 09/03/20   Blanchie Dessert, MD  potassium chloride (KLOR-CON M10) 10 MEQ tablet Take 1 tablet (10 mEq total) by mouth 2 (two) times daily. 05/27/18   Jearld Lesch A, DO  promethazine-dextromethorphan (PROMETHAZINE-DM) 6.25-15 MG/5ML syrup Take 5 mLs by mouth at bedtime as needed for cough. 01/12/20   Jaynee Eagles, PA-C  pseudoephedrine (SUDAFED) 30 MG tablet Take 1 tablet (30 mg total) by mouth every 8 (eight) hours as needed for congestion. 01/12/20   Jaynee Eagles, PA-C  Semaglutide,0.25 or 0.5MG /DOS, (OZEMPIC, 0.25 OR 0.5 MG/DOSE,) 2 MG/1.5ML SOPN Inject 0.5 mg into the skin once a week. DX:E11.69 04/12/20   Opalski, Neoma Laming, DO  tiZANidine (ZANAFLEX) 4 MG tablet  Take 4 mg by mouth every 8 (eight) hours as needed. 12/22/19   [provider]  topiramate (TOPAMAX) 25 MG tablet Take 1 tablet (25 mg total) by mouth at bedtime. 04/12/20   Opalski, Neoma Laming, DO  traMADol (ULTRAM) 50 MG tablet Take 1 tablet (50 mg total) by mouth every 6 (six) hours as needed. 11/08/19   Drenda Freeze, MD  traZODone (DESYREL) 50 MG tablet Take 1 tablet by mouth at bedtime. 09/09/19   [provider]    Family History Family History  Problem Relation Age of Onset   Emphysema Mother    Arthritis Mother    Heart failure Mother        alive @ 45   Stroke Mother    Diabetes Mother    Hypertension Mother    Hyperlipidemia Mother    Depression Mother    Anxiety disorder Mother    Asthma Brother    Heart disease Father        died @ 11's.   Stroke Father    Diabetes Father    Hyperlipidemia Father    Hypertension Father    Bipolar disorder Father    Sleep apnea Father    Alcoholism Father    Drug abuse Father    Diabetes Sister    Heart disease Paternal Grandfather    Colon cancer Maternal Grandfather    Breast cancer Maternal Aunt     Social History Social History   Tobacco Use   Smoking status: Former    Packs/day: 0.50    Years: 15.00    Pack years: 7.50    Types: Cigarettes    Quit date: 04/24/1985    Years since quitting: 35.8   Smokeless tobacco: Never  Substance Use Topics   Alcohol use: No    Alcohol/week: 0.0 standard drinks   Drug use: Not Currently    Types: Marijuana     Allergies   Penicillins, Hydrocodone-acetaminophen, Latex, and Sulfa antibiotics   Review of Systems Review of Systems  Respiratory:  Positive for shortness of breath.   Cardiovascular:  Positive for chest pain and leg swelling.  All other systems reviewed and are negative.   Physical Exam Triage Vital Signs ED Triage Vitals [03/07/21 1634]  Enc Vitals Group     BP (!) 144/85     Pulse Rate 65     Resp 18     Temp (!) 97.4 F (36.3 C)      Temp Source Oral     SpO2 96 %     Weight      Height      Head Circumference      Peak Flow      Pain Score 8     Pain Loc      Pain Edu?      Excl. in [place]?    No data found.  Updated Vital Signs BP (!) 144/85 (BP Location: Right Arm)   Pulse 65   Temp (!) 97.4 F (36.3 C) (Oral)   Resp 18   SpO2 96%   Visual Acuity Right Eye Distance:   Left Eye Distance:   Bilateral Distance:    Right Eye Near:   Left Eye Near:    Bilateral Near:     Physical Exam Vitals reviewed.  Constitutional:      Appearance: Normal appearance. She is not diaphoretic.  HENT:     Head: Normocephalic and atraumatic.     Mouth/Throat:     Mouth: Mucous membranes are moist.  Eyes:     Extraocular Movements: Extraocular movements intact.     Pupils: Pupils are equal, round, and reactive to light.  Cardiovascular:     Rate and Rhythm: Normal rate and regular rhythm.     Pulses:          Radial pulses are 2+ on the right side and 2+ on the left side.     Heart sounds: Normal heart sounds.     Comments: No calf swelling or tenderness Negative homan sign Radial pulses 2+ and regular  Pulmonary:     Effort: Pulmonary effort is normal.     Breath sounds: Normal breath sounds.  Abdominal:     Palpations: Abdomen is soft.     Tenderness: There is no abdominal tenderness. There is no guarding or rebound.  Musculoskeletal:     Right lower leg: No edema.     Left lower leg: 1+ Edema present.  Skin:    General: Skin is warm.     Capillary Refill: Capillary refill takes less than 2 seconds.  Neurological:     General: No focal deficit present.     Mental Status: She is alert and oriented to person, place, and time.  Psychiatric:        Mood and Affect: Mood normal.        Behavior: Behavior normal.        Thought Content: Thought content normal.        Judgment: Judgment normal.     UC Treatments / Results  Labs (all labs ordered are listed, but only abnormal results are  displayed) Labs Reviewed - No data to display  EKG   Radiology No results found.  Procedures Procedures (  including critical care time)  Medications Ordered in UC Medications - No data to display  Initial Impression / Assessment and Plan / UC Course  I have reviewed the triage vital signs and the nursing notes.  Pertinent labs & imaging results that were available during my care of the patient were reviewed by me and considered in my medical decision making (see chart for details).     This patient is a very pleasant 60 y.o. year old female presenting with chest pain and shortness of breath. Afebrile, nontachy. CP is not reproducible. EKG with depressions in V5 and V6 when compared with 09/2020 EKG. Weight gain of 3 pounds this week. I do have concern for acute cardiac pathology. She does also have history of nonproved PE. Sent to ED via personal vehicle driven by daughter, she declines transport via EMS.   Level 5 as this patient was admitted  Final Clinical Impressions(s) / UC Diagnoses   Final diagnoses:  Atypical chest pain     Discharge Instructions      -Please head straight to the emergency department for further evaluation and management of your chest pain.  I am concerned that this is a heart issue given the EKG changes and your symptoms.  Please head straight there, make sure your daughter drives the vehicle.  If symptoms get worse on the way, like chest pain, shortness of breath, dizziness, loss of consciousness-stop and call 911 immediately.   ED Prescriptions   None    PDMP not reviewed this encounter.   Hazel Sams, PA-C 03/07/21 [place], [place], PA-C 03/08/21 [phone]

## 2021-03-07 NOTE — ED Provider Notes (Signed)
[facility] EMERGENCY DEPT Provider Note   CSN: [id number] Arrival date & time: 03/07/21  1845     History Chief Complaint  Patient presents with   Chest Pain    JENISSA TYRELL is a 60 y.o. female.  The history is provided by the patient and medical records. No language interpreter was used.  Chest Pain Pain location:  Substernal area Pain quality: aching, pressure and tightness   Pain severity:  Moderate Onset quality:  Gradual Duration:  2 days Timing:  Intermittent Progression:  Waxing and waning Chronicity:  New Relieved by:  Nothing Worsened by:  Exertion Ineffective treatments:  None tried Associated symptoms: diaphoresis, fatigue, nausea, palpitations and shortness of breath   Associated symptoms: no abdominal pain, no altered mental status, no back pain, no cough, no fever, no headache, no lower extremity edema, no vomiting and no weakness   Risk factors: coronary artery disease, hypertension and prior DVT/PE   Risk factors: not female       Past Medical History:  Diagnosis Date   Anginal pain ([place])    a. NL cath in 2008;  b. Myoview 03/2011: dec uptake along mid anterior wall on stress imaging -> ? attenuation vs. ischemia, EF 65%;  c. Echo 04/2011: EF 55-60%, no RWMA, Gr 2 dd   Anxiety    Arthritis    Asthma    Back pain    Bone cancer (HCC)    Cancer (HCC)    Chest pain    CHF (congestive heart failure) (HCC)    Depression    Diabetes mellitus    Drug use    Dyspnea    Frequent urination    GERD (gastroesophageal reflux disease)    Glaucoma    HLD (hyperlipidemia)    Hypertension    IBS (irritable bowel syndrome)    Joint pain    Lactose intolerance    Leg edema    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 benign thymoma   Obesity    Palpitations    Pneumonia 05/2016   double   Pulmonary edema    Pulmonary embolism ([place])    a. 2008 -> coumadin x 6 mos.   Rheumatoid arthritis ([place])    Sleep apnea    on CPAP 02/2018   TIA  (transient ischemic attack)    Urinary urgency     Patient Active Problem List   Diagnosis Date Noted   At risk for impaired metabolic function 09/32/6712   Unilateral primary osteoarthritis, left hip    Hip arthritis 06/11/2018   Class 3 severe obesity with serious comorbidity and body mass index (BMI) of 40.0 to 44.9 in adult [facility]) 05/29/2018   Other fatigue 11/20/2017   Shortness of breath on exertion 11/20/2017   Diabetes mellitus ([place]) 11/20/2017   Vitamin D deficiency 11/20/2017   Depression 11/20/2017   Other hyperlipidemia 11/20/2017   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/04/2016   Presence of left artificial knee joint 09/20/2016   Arthritis of knee 08/22/2016   Cervical radiculopathy 07/26/2016   Cervical disc disorder with radiculopathy 07/26/2016   Right arm pain 06/29/2016   Cervicalgia 06/29/2016   Primary osteoarthritis of left knee 06/29/2016   Hypersomnia with sleep apnea 11/18/2015   Lethargy 11/18/2015   Chest pain 09/30/2014   Abnormal cardiac function test 09/29/2014   Chest pain with moderate risk for cardiac etiology 09/28/2014   HTN (hypertension) 09/28/2014   Hypokalemia    Paresthesia 06/18/2014   Numbness  and tingling of left arm and leg    Unstable angina ([place]) 05/24/2014   OSA on CPAP 05/24/2014   CAD (coronary artery disease) 11/25/2012   S/P laparoscopic appendectomy 54/27/0623   Diastolic CHF ([place]) 76/28/3151   Mediastinal mass 01/09/2012   SOB (shortness of breath) 06/20/2011   Mediastinal abnormality 06/20/2011   Dyslipidemia 12/23/2009   GLAUCOMA 12/23/2009   ARTHRITIS 12/23/2009   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   Class 2 severe obesity with serious comorbidity and body mass index (BMI) of 38.0 to 38.9 in adult ([place]) 09/13/2006   ANXIETY STATE NOS 09/13/2006   DISORDER, DEPRESSIVE NEC 09/13/2006   CARPAL TUNNEL SYNDROME, MILD 09/13/2006   Unspecified essential hypertension 09/13/2006   IBS 09/13/2006   DEGENERATION, LUMBAR/LUMBOSACRAL DISC  09/13/2006   SYMPTOM, SWELLING/MASS/LUMP IN CHEST 09/13/2006   PULMONARY EMBOLISM, HX OF 09/13/2006    Past Surgical History:  Procedure Laterality Date   ABDOMINAL HYSTERECTOMY  2005   APPENDECTOMY     CARDIAC CATHETERIZATION     Normal   CARDIAC CATHETERIZATION N/A 09/30/2014   Procedure: Left Heart Cath and Coronary Angiography;  Surgeon: Sherren Mocha, MD;  Location: [facility];  Service: Cardiovascular;  Laterality: N/A;   LAPAROSCOPIC APPENDECTOMY N/A 06/03/2012   Procedure: APPENDECTOMY LAPAROSCOPIC;  Surgeon: Stark Klein, MD;  Location: [facility];  Service: General;  Laterality: N/A;   Left knee surgery  2008   LEG SURGERY     TONSILLECTOMY     TOTAL HIP ARTHROPLASTY Left 06/11/2018   Procedure: LEFT TOTAL HIP ARTHROPLASTY ANTERIOR APPROACH;  Surgeon: Meredith Pel, MD;  Location: [facility];  Service: Orthopedics;  Laterality: Left;   TOTAL KNEE ARTHROPLASTY Left 08/22/2016   Procedure: TOTAL KNEE ARTHROPLASTY;  Surgeon: Meredith Pel, MD;  Location: [facility];  Service: Orthopedics;  Laterality: Left;   TUBAL LIGATION  1989     OB History     Gravida  3   Para      Term      Preterm      AB      Living         SAB      IAB      Ectopic      Multiple      Live Births              Family History  Problem Relation Age of Onset   Emphysema Mother    Arthritis Mother    Heart failure Mother        alive @ 70   Stroke Mother    Diabetes Mother    Hypertension Mother    Hyperlipidemia Mother    Depression Mother    Anxiety disorder Mother    Asthma Brother    Heart disease Father        died @ 9's.   Stroke Father    Diabetes Father    Hyperlipidemia Father    Hypertension Father    Bipolar disorder Father    Sleep apnea Father    Alcoholism Father    Drug abuse Father    Diabetes Sister    Heart disease Paternal Grandfather    Colon cancer Maternal Grandfather    Breast cancer Maternal Aunt     Social History   Tobacco Use    Smoking status: Former    Packs/day: 0.50    Years: 15.00    Pack years: 7.50    Types: Cigarettes    Quit date: 04/24/1985  Years since quitting: 35.8   Smokeless tobacco: Never  Substance Use Topics   Alcohol use: No    Alcohol/week: 0.0 standard drinks   Drug use: Not Currently    Types: Marijuana    Home Medications Prior to Admission medications   Medication Sig Start Date End Date Taking? Authorizing Provider  albuterol (PROVENTIL HFA;VENTOLIN HFA) 108 (90 Base) MCG/ACT inhaler Inhale 2 puffs into the lungs every 6 (six) hours as needed for wheezing or shortness of breath.    [provider]  aspirin 81 MG chewable tablet Chew 81 mg by mouth daily.    [provider]  atorvastatin (LIPITOR) 40 MG tablet Take 40 mg by mouth at bedtime.     [provider]  busPIRone (BUSPAR) 30 MG tablet Take 30 mg by mouth 2 (two) times daily. 12/18/19   [provider]  carvedilol (COREG) 12.5 MG tablet Take 12.5 mg by mouth at bedtime. 08/11/19   [provider]  Cholecalciferol (VITAMIN D) 125 MCG (5000 UT) CAPS Take 2 capsules by mouth daily. 10/28/19   Whitmire, Joneen Boers, FNP  cyclobenzaprine (FLEXERIL) 10 MG tablet Take 1 tablet (10 mg total) by mouth at bedtime. 12/21/19   Rayna Sexton, PA-C  diclofenac Sodium (VOLTAREN) 1 % GEL Apply 2 g topically 4 (four) times daily. 09/20/19   [provider]  escitalopram (LEXAPRO) 20 MG tablet Take 20 mg by mouth daily. 02/21/19   [provider]  furosemide (LASIX) 40 MG tablet Take 40 mg by mouth daily.    Rai, Ripudeep K, MD  isosorbide dinitrate (ISORDIL) 20 MG tablet Take 20 mg by mouth 2 (two) times daily.    [provider]  lisinopril (PRINIVIL,ZESTRIL) 20 MG tablet Take 20 mg by mouth daily.  09/21/14   [provider]  loperamide (IMODIUM A-D) 2 MG tablet Take 4 mg by mouth 4 (four) times daily as needed for diarrhea or loose stools.     [provider]   meloxicam (MOBIC) 15 MG tablet Take 15 mg by mouth daily as needed. 12/05/19   [provider]  MYRBETRIQ 25 MG TB24 tablet Take 1 tablet (25 mg total) by mouth daily. 03/13/19   Dennard Nip D, MD  omeprazole (PRILOSEC) 20 MG capsule Take 20 mg by mouth 2 (two) times daily. 10/05/16   [provider]  ondansetron (ZOFRAN) 4 MG tablet Take 1 tablet (4 mg total) by mouth every 6 (six) hours. 09/03/20   Blanchie Dessert, MD  potassium chloride (KLOR-CON M10) 10 MEQ tablet Take 1 tablet (10 mEq total) by mouth 2 (two) times daily. 05/27/18   Jearld Lesch A, DO  promethazine-dextromethorphan (PROMETHAZINE-DM) 6.25-15 MG/5ML syrup Take 5 mLs by mouth at bedtime as needed for cough. 01/12/20   Jaynee Eagles, PA-C  pseudoephedrine (SUDAFED) 30 MG tablet Take 1 tablet (30 mg total) by mouth every 8 (eight) hours as needed for congestion. 01/12/20   Jaynee Eagles, PA-C  Semaglutide,0.25 or 0.5MG /DOS, (OZEMPIC, 0.25 OR 0.5 MG/DOSE,) 2 MG/1.5ML SOPN Inject 0.5 mg into the skin once a week. DX:E11.69 04/12/20   Opalski, Neoma Laming, DO  tiZANidine (ZANAFLEX) 4 MG tablet Take 4 mg by mouth every 8 (eight) hours as needed. 12/22/19   [provider]  topiramate (TOPAMAX) 25 MG tablet Take 1 tablet (25 mg total) by mouth at bedtime. 04/12/20   Opalski, Neoma Laming, DO  traMADol (ULTRAM) 50 MG tablet Take 1 tablet (50 mg total) by mouth every 6 (six) hours as needed.  11/08/19   Drenda Freeze, MD  traZODone (DESYREL) 50 MG tablet Take 1 tablet by mouth at bedtime. 09/09/19   [provider]    Allergies    Penicillins, Hydrocodone-acetaminophen, Latex, and Sulfa antibiotics  Review of Systems   Review of Systems  Constitutional:  Positive for diaphoresis and fatigue. Negative for fever.  HENT:  Negative for congestion.   Respiratory:  Positive for chest tightness and shortness of breath. Negative for cough and wheezing.   Cardiovascular:  Positive for chest pain and palpitations.   Gastrointestinal:  Positive for nausea. Negative for abdominal pain, constipation, diarrhea and vomiting.  Genitourinary:  Negative for flank pain.  Musculoskeletal:  Negative for back pain, neck pain and neck stiffness.  Skin:  Negative for rash and wound.  Neurological:  Negative for weakness, light-headedness and headaches.  Psychiatric/Behavioral:  Negative for agitation and confusion.   All other systems reviewed and are negative.  Physical Exam Updated Vital Signs BP 131/68   Pulse 72   Temp 98.1 F (36.7 C)   Resp 18   Ht 4\' 11"  (1.499 m)   Wt 96.2 kg   SpO2 97%   BMI 42.82 kg/m   Physical Exam Vitals and nursing note reviewed.  Constitutional:      General: She is not in acute distress.    Appearance: She is well-developed. She is not ill-appearing, toxic-appearing or diaphoretic.  HENT:     Head: Normocephalic and atraumatic.  Eyes:     Conjunctiva/sclera: Conjunctivae normal.     Pupils: Pupils are equal, round, and reactive to light.  Cardiovascular:     Rate and Rhythm: Normal rate and regular rhythm.     Heart sounds: Normal heart sounds. No murmur heard. Pulmonary:     Effort: Pulmonary effort is normal. No respiratory distress.     Breath sounds: Normal breath sounds. No decreased breath sounds, wheezing, rhonchi or rales.  Chest:     Chest wall: No tenderness.  Abdominal:     Palpations: Abdomen is soft.     Tenderness: There is no abdominal tenderness.  Musculoskeletal:     Cervical back: Neck supple.     Right lower leg: No tenderness. Edema present.     Left lower leg: No tenderness. Edema present.  Skin:    General: Skin is warm and dry.     Findings: No erythema.  Neurological:     General: No focal deficit present.     Mental Status: She is alert.  Psychiatric:        Mood and Affect: Mood normal.    ED Results / Procedures / Treatments   Labs (all labs ordered are listed, but only abnormal results are displayed) Labs Reviewed   COMPREHENSIVE METABOLIC PANEL - Abnormal; Notable for the following components:      Result Value   Glucose, Bld 245 (*)    Creatinine, Ser 1.03 (*)    All other components within normal limits  RESP PANEL BY RT-PCR (FLU A&B, COVID) ARPGX2  CBC  BRAIN NATRIURETIC PEPTIDE  TROPONIN I (HIGH SENSITIVITY)  TROPONIN I (HIGH SENSITIVITY)    EKG EKG Interpretation  Date/Time:  Monday March 07 2021 18:52:35 EST Ventricular Rate:  75 PR Interval:  182 QRS Duration: 106 QT Interval:  400 QTC Calculation: 446 R Axis:   56 Text Interpretation: Normal sinus rhythm Incomplete left bundle branch block Nonspecific ST and T wave abnormality Abnormal ECG When compared to prior, slightly more ST depression in lead  2. No STEMI Confirmed by Antony Blackbird [phone]) on 03/07/2021 9:25:24 PM  Radiology DG Chest 2 View  Result Date: 03/07/2021 CLINICAL DATA:  Chest pain, shortness of breath EXAM: CHEST - 2 VIEW COMPARISON:  10/14/2020 FINDINGS: Heart and mediastinal contours are within normal limits. No focal opacities or effusions. No acute bony abnormality. IMPRESSION: No active cardiopulmonary disease. Electronically Signed   By: Rolm Baptise M.D.   On: 03/07/2021 19:21   CT Angio Chest PE W and/or Wo Contrast  Result Date: 03/07/2021 CLINICAL DATA:  PE suspected, high prob ResultsHistory of PE, similar pain or shortness of breath. Recent left leg pain and swelling. EXAM: CT ANGIOGRAPHY CHEST WITH CONTRAST TECHNIQUE: Multidetector CT imaging of the chest was performed using the standard protocol during bolus administration of intravenous contrast. Multiplanar CT image reconstructions and MIPs were obtained to evaluate the vascular anatomy. CONTRAST:  190mL OMNIPAQUE IOHEXOL 350 MG/ML SOLN COMPARISON:  Radiograph earlier today.  Chest CTA 10/14/2020 FINDINGS: Cardiovascular: There are no filling defects within the pulmonary arteries to suggest pulmonary embolus. Subsegmental branches are suboptimally  assessed due to soft tissue attenuation from habitus. Stable prominent main pulmonary artery at 3 cm. Normal caliber thoracic aorta. Common origin of the brachiocephalic and left common carotid artery, variant arch anatomy. Normal heart size. Small pericardial effusion measures up to 13 mm in depth adjacent to the right ventricle. This has slightly increased in size from prior exam Mediastinum/Nodes: Stable rounded retrosternal density that is likely exophytic from enlarged right lobe of the thyroid gland. The previous 3.4 cm right thyroid nodule is not well-defined on the current exam. Prior thyroid ultrasound, 05/27/2020. Similar leftward tracheal deviation. Small mediastinal and hilar lymph nodes are not enlarged by size criteria. There is mild distal esophageal wall thickening. Lungs/Pleura: Mild lingular scarring and subsegmental atelectasis in the lower lobes. No consolidation or evidence of pneumonia. No findings of pulmonary edema. No pleural effusion. The trachea and central bronchi are patent. Upper Abdomen: Prominent liver, partially included. No acute upper abdominal findings. Musculoskeletal: Mild thoracic spondylosis with endplate spurring. There are no acute or suspicious osseous abnormalities. Review of the MIP images confirms the above findings. IMPRESSION: 1. No pulmonary embolus. 2. Small pericardial effusion, slightly increased in size from prior exam. 3. Mild distal esophageal wall thickening, can be seen with reflux or esophagitis. Electronically Signed   By: Keith Rake M.D.   On: 03/07/2021 23:28   US Venous Img Lower Unilateral Left  Result Date: 03/07/2021 CLINICAL DATA:  Leg swelling. EXAM: BILATERAL LOWER EXTREMITY VENOUS DOPPLER ULTRASOUND TECHNIQUE: Gray-scale sonography with compression, as well as color and duplex ultrasound, were performed to evaluate the deep venous system(s) from the level of the common femoral vein through the popliteal and proximal calf veins.  COMPARISON:  None. FINDINGS: VENOUS Normal compressibility of the common femoral, superficial femoral, and popliteal veins, as well as the visualized calf veins. Visualized portions of profunda femoral vein and great saphenous vein unremarkable. No filling defects to suggest DVT on grayscale or color Doppler imaging. Doppler waveforms show normal direction of venous flow, normal respiratory plasticity and response to augmentation. Limited views of the contralateral common femoral vein are unremarkable. OTHER None. Limitations: none IMPRESSION: Negative. Electronically Signed   By: Ronney Asters M.D.   On: 03/07/2021 23:46    Procedures Procedures   Medications Ordered in ED Medications  iohexol (OMNIPAQUE) 350 MG/ML injection 100 mL (100 mLs Intravenous Contrast Given 03/07/21 2307)  aspirin chewable tablet 243 mg (243 mg  Oral Given 03/08/21 0000)    ED Course  I have reviewed the triage vital signs and the nursing notes.  Pertinent labs & imaging results that were available during my care of the patient were reviewed by me and considered in my medical decision making (see chart for details).    MDM Rules/Calculators/A&P                           ANTORIA LANZA is a 60 y.o. female with a past medical history significant for previous PE not on anticoagulation, CHF, TIA, previous mediastinal mass, diabetes, hypertension, hyperlipidemia, and anxiety who presents with chest pain.  According to patient, she is having worsening shortness of breath for the last week but over the last 2 days has had worsened exertional chest tightness and pain with it.  She reports the pain is throbbing dull and pressure in her central chest that worsens with exertion.  She denies any trauma.  She reports she gets worsened shortness of breath with it, some mild diaphoresis, nausea, and lightheadedness.  She denies any syncope.  She reports she has had some mild swelling in her legs left worse than right with some  pain.  Otherwise abdomen nontender with no constipation, diarrhea, or urinary changes.  She was seen in urgent care and they were concerned about her heart and transferred here for evaluation.  On my exam, chest was nontender.  Abdomen was nontender.  Lungs were clear and I did not hear a murmur.  Intact sensation, strength, and pulses in extremities.  When patient fell asleep and resting she did have hypoxia and was placed on some oxygen.  EKG appears more abnormal than prior with more ST depression in the inferior leads.  Work-up was started and her troponin was negative x2, BNP was not elevated, and I did do a CT PE study given her history of PE and there is no evidence of clot.  She does have a slightly worsened pericardial effusion of unclear significance and her COVID and flu are negative.  Otherwise her CBC and CMP are reassuring.  Ultrasound did not show DVT.  Given the concerning exertional chest pressure and the EKG abnormalities with her heart score of 6, do not feel she is safe for discharge home.  I feel she is appropriate for a medicine admission for likely echo and further troponin trending.  This will also help further evaluate the pericardial effusion that has worsened and her discomfort.  Patient agrees with plan of care and will be admitted.     Final Clinical Impression(s) / ED Diagnoses Final diagnoses:  Exertional chest pain    Clinical Impression: 1. Exertional chest pain     Disposition: Admit  This note was prepared with assistance of Dragon voice recognition software. Occasional wrong-word or sound-a-like substitutions may have occurred due to the inherent limitations of voice recognition software.     Shaqueena Mauceri, Gwenyth Allegra, MD 03/08/21 [phone]

## 2021-03-07 NOTE — Progress Notes (Signed)
[facility] will assume care on arrival to accepting facility. Until arrival, care as per EDP. However, [facility] available 24/7 for questions and assistance.   Nursing staff please page [facility] Admits and Consults ([phone]) as soon as the patient arrives to the hospital.  Chania Kochanski, DO  

## 2021-03-07 NOTE — ED Triage Notes (Signed)
Hx of CHF. States recently she's becoming progressively short of breath with intermittent chest pain ever since she took some prednisone a month ago. Today after walking she felt worsening chest pain, shoulder pressure, short of breath, and now feels like she's going to pass out. Mild bilateral lower leg edema present in triage

## 2021-03-07 NOTE — ED Notes (Signed)
Patient has Phx of Sleep Apnea. SpO2 dropping to low 80's on Room Air while sleeping. Pt placed on 2LNC at this time. MD aware.

## 2021-03-07 NOTE — ED Triage Notes (Signed)
Pt arrives tp ED with c/o of chest pain. This pain started x2 days ago. Associated symptoms include SOB. She reports the chest pain is intermittent. The chest pain is described as throbbing/dull. The chest pain typically last 10 minutes. The pain radiates to her shoulders and back. She reports increased swelling to her left leg. She reports that her GERD has been worse this week.

## 2021-03-08 ENCOUNTER — Other Ambulatory Visit: Payer: Self-pay

## 2021-03-08 DIAGNOSIS — I2510 Atherosclerotic heart disease of native coronary artery without angina pectoris: Secondary | ICD-10-CM | POA: Diagnosis not present

## 2021-03-08 DIAGNOSIS — R079 Chest pain, unspecified: Secondary | ICD-10-CM

## 2021-03-08 DIAGNOSIS — Z87891 Personal history of nicotine dependence: Secondary | ICD-10-CM | POA: Diagnosis not present

## 2021-03-08 DIAGNOSIS — E119 Type 2 diabetes mellitus without complications: Secondary | ICD-10-CM | POA: Diagnosis not present

## 2021-03-08 DIAGNOSIS — I10 Essential (primary) hypertension: Secondary | ICD-10-CM | POA: Diagnosis not present

## 2021-03-08 DIAGNOSIS — Z7982 Long term (current) use of aspirin: Secondary | ICD-10-CM | POA: Diagnosis not present

## 2021-03-08 DIAGNOSIS — R072 Precordial pain: Secondary | ICD-10-CM | POA: Diagnosis not present

## 2021-03-08 DIAGNOSIS — J45909 Unspecified asthma, uncomplicated: Secondary | ICD-10-CM | POA: Diagnosis not present

## 2021-03-08 DIAGNOSIS — Z20822 Contact with and (suspected) exposure to covid-19: Secondary | ICD-10-CM | POA: Diagnosis not present

## 2021-03-08 DIAGNOSIS — E785 Hyperlipidemia, unspecified: Secondary | ICD-10-CM | POA: Diagnosis not present

## 2021-03-08 DIAGNOSIS — I5032 Chronic diastolic (congestive) heart failure: Secondary | ICD-10-CM | POA: Diagnosis not present

## 2021-03-08 DIAGNOSIS — I110 Hypertensive heart disease with heart failure: Secondary | ICD-10-CM | POA: Diagnosis not present

## 2021-03-08 DIAGNOSIS — Z79899 Other long term (current) drug therapy: Secondary | ICD-10-CM | POA: Diagnosis not present

## 2021-03-08 DIAGNOSIS — Z96642 Presence of left artificial hip joint: Secondary | ICD-10-CM | POA: Diagnosis not present

## 2021-03-08 DIAGNOSIS — Z8673 Personal history of transient ischemic attack (TIA), and cerebral infarction without residual deficits: Secondary | ICD-10-CM | POA: Diagnosis not present

## 2021-03-08 DIAGNOSIS — Z96652 Presence of left artificial knee joint: Secondary | ICD-10-CM | POA: Diagnosis not present

## 2021-03-08 DIAGNOSIS — Z85830 Personal history of malignant neoplasm of bone: Secondary | ICD-10-CM | POA: Diagnosis not present

## 2021-03-08 DIAGNOSIS — Z91040 Latex allergy status: Secondary | ICD-10-CM | POA: Diagnosis not present

## 2021-03-08 LAB — CBC
HCT: 36.9 % (ref 36.0–46.0)
Hemoglobin: 12 g/dL (ref 12.0–15.0)
MCH: 29.1 pg (ref 26.0–34.0)
MCHC: 32.5 g/dL (ref 30.0–36.0)
MCV: 89.6 fL (ref 80.0–100.0)
Platelets: 195 10*3/uL (ref 150–400)
RBC: 4.12 MIL/uL (ref 3.87–5.11)
RDW: 12.7 % (ref 11.5–15.5)
WBC: 5.7 10*3/uL (ref 4.0–10.5)
nRBC: 0 % (ref 0.0–0.2)

## 2021-03-08 LAB — MRSA NEXT GEN BY PCR, NASAL: MRSA by PCR Next Gen: NOT DETECTED

## 2021-03-08 LAB — TROPONIN I (HIGH SENSITIVITY)
Troponin I (High Sensitivity): 10 ng/L (ref <18)
Troponin I (High Sensitivity): 9 ng/L (ref <18)

## 2021-03-08 LAB — GLUCOSE, CAPILLARY
Glucose-Capillary: 147 mg/dL — ABNORMAL HIGH (ref 70–99)
Glucose-Capillary: 187 mg/dL — ABNORMAL HIGH (ref 70–99)
Glucose-Capillary: 207 mg/dL — ABNORMAL HIGH (ref 70–99)

## 2021-03-08 LAB — HEMOGLOBIN A1C
Hgb A1c MFr Bld: 8.4 % — ABNORMAL HIGH (ref 4.8–5.6)
Mean Plasma Glucose: 194.38 mg/dL

## 2021-03-08 LAB — CREATININE, SERUM
Creatinine, Ser: 0.89 mg/dL (ref 0.44–1.00)
GFR, Estimated: >60 mL/min (ref >60)

## 2021-03-08 LAB — HIV ANTIBODY (ROUTINE TESTING W REFLEX): HIV Screen 4th Generation wRfx: NONREACTIVE

## 2021-03-08 LAB — BRAIN NATRIURETIC PEPTIDE: B Natriuretic Peptide: 47.6 pg/mL (ref 0.0–100.0)

## 2021-03-08 LAB — CBG MONITORING, ED: Glucose-Capillary: 155 mg/dL — ABNORMAL HIGH (ref 70–99)

## 2021-03-08 MED ORDER — LOPERAMIDE HCL 2 MG PO CAPS: 2.0000 mg | ORAL_CAPSULE | Freq: Four times a day (QID) | ORAL | Status: DC | PRN

## 2021-03-08 MED ORDER — ATORVASTATIN CALCIUM 40 MG PO TABS
40.0000 mg | ORAL_TABLET | Freq: Every day | ORAL | Status: DC
  Administered 2021-03-08: 40 mg via ORAL
  Filled 2021-03-08: qty 1

## 2021-03-08 MED ORDER — ASPIRIN 81 MG PO CHEW
324.0000 mg | CHEWABLE_TABLET | Freq: Every day | ORAL | Status: DC
  Administered 2021-03-08 – 2021-03-09 (×2): 324 mg via ORAL
  Filled 2021-03-08 (×2): qty 4

## 2021-03-08 MED ORDER — MORPHINE SULFATE (PF) 4 MG/ML IV SOLN
4.0000 mg | Freq: Once | INTRAVENOUS | Status: AC
  Administered 2021-03-08: 4 mg via INTRAVENOUS
  Filled 2021-03-08: qty 1

## 2021-03-08 MED ORDER — PANTOPRAZOLE SODIUM 40 MG PO TBEC
40.0000 mg | DELAYED_RELEASE_TABLET | Freq: Two times a day (BID) | ORAL | Status: DC
  Administered 2021-03-08 – 2021-03-09 (×3): 40 mg via ORAL
  Filled 2021-03-08 (×3): qty 1

## 2021-03-08 MED ORDER — HYDROXYZINE PAMOATE 25 MG PO CAPS
25.0000 mg | ORAL_CAPSULE | Freq: Three times a day (TID) | ORAL | Status: DC | PRN
  Filled 2021-03-08: qty 1

## 2021-03-08 MED ORDER — TOPIRAMATE 25 MG PO TABS
25.0000 mg | ORAL_TABLET | Freq: Every day | ORAL | Status: DC
  Administered 2021-03-08: 25 mg via ORAL
  Filled 2021-03-08 (×2): qty 1

## 2021-03-08 MED ORDER — ENOXAPARIN SODIUM 40 MG/0.4ML IJ SOSY
40.0000 mg | PREFILLED_SYRINGE | INTRAMUSCULAR | Status: DC
  Administered 2021-03-08 – 2021-03-09 (×2): 40 mg via SUBCUTANEOUS
  Filled 2021-03-08: qty 0.4

## 2021-03-08 MED ORDER — POTASSIUM CHLORIDE CRYS ER 20 MEQ PO TBCR
40.0000 meq | EXTENDED_RELEASE_TABLET | Freq: Once | ORAL | Status: AC
  Administered 2021-03-08: 40 meq via ORAL
  Filled 2021-03-08: qty 2

## 2021-03-08 MED ORDER — CARVEDILOL 12.5 MG PO TABS
12.5000 mg | ORAL_TABLET | Freq: Every day | ORAL | Status: DC
  Administered 2021-03-08 – 2021-03-09 (×2): 12.5 mg via ORAL
  Filled 2021-03-08 (×2): qty 1

## 2021-03-08 MED ORDER — MORPHINE SULFATE (PF) 2 MG/ML IV SOLN
1.0000 mg | INTRAVENOUS | Status: DC | PRN
  Administered 2021-03-09: 1 mg via INTRAVENOUS
  Filled 2021-03-08: qty 1

## 2021-03-08 MED ORDER — INSULIN ASPART 100 UNIT/ML IJ SOLN
0.0000 [IU] | Freq: Three times a day (TID) | INTRAMUSCULAR | Status: DC
  Administered 2021-03-08: 3 [IU] via SUBCUTANEOUS
  Administered 2021-03-09: 5 [IU] via SUBCUTANEOUS
  Administered 2021-03-09: 3 [IU] via SUBCUTANEOUS

## 2021-03-08 MED ORDER — LOPERAMIDE HCL 2 MG PO TABS: 4.0000 mg | ORAL_TABLET | Freq: Four times a day (QID) | ORAL | Status: DC | PRN

## 2021-03-08 MED ORDER — INSULIN ASPART 100 UNIT/ML IJ SOLN
0.0000 [IU] | Freq: Three times a day (TID) | INTRAMUSCULAR | Status: DC
Start: 2021-03-08 — End: 2021-03-08

## 2021-03-08 MED ORDER — NITROGLYCERIN 2 % TD OINT
1.0000 [in_us] | TOPICAL_OINTMENT | Freq: Once | TRANSDERMAL | Status: AC
  Administered 2021-03-08: 1 [in_us] via TOPICAL
  Filled 2021-03-08: qty 30

## 2021-03-08 MED ORDER — INSULIN ASPART 100 UNIT/ML IJ SOLN
0.0000 [IU] | Freq: Every day | INTRAMUSCULAR | Status: DC
  Administered 2021-03-08: 2 [IU] via SUBCUTANEOUS

## 2021-03-08 MED ORDER — BUSPIRONE HCL 15 MG PO TABS
30.0000 mg | ORAL_TABLET | Freq: Two times a day (BID) | ORAL | Status: DC
  Administered 2021-03-08 – 2021-03-09 (×3): 30 mg via ORAL
  Filled 2021-03-08 (×3): qty 2

## 2021-03-08 MED ORDER — ALBUTEROL SULFATE (2.5 MG/3ML) 0.083% IN NEBU: 2.5000 mg | INHALATION_SOLUTION | Freq: Four times a day (QID) | RESPIRATORY_TRACT | Status: DC | PRN

## 2021-03-08 MED ORDER — NITROGLYCERIN 2 % TD OINT
1.0000 [in_us] | TOPICAL_OINTMENT | Freq: Once | TRANSDERMAL | Status: AC
  Administered 2021-03-08: 1 [in_us] via TOPICAL
  Filled 2021-03-08: qty 1

## 2021-03-08 MED ORDER — MIRABEGRON ER 25 MG PO TB24
25.0000 mg | ORAL_TABLET | Freq: Every day | ORAL | Status: DC
  Administered 2021-03-08 – 2021-03-09 (×2): 25 mg via ORAL
  Filled 2021-03-08 (×2): qty 1

## 2021-03-08 MED ORDER — HYDROXYZINE HCL 25 MG PO TABS: 25.0000 mg | ORAL_TABLET | Freq: Three times a day (TID) | ORAL | Status: DC | PRN

## 2021-03-08 MED ORDER — ISOSORBIDE DINITRATE 10 MG PO TABS
20.0000 mg | ORAL_TABLET | Freq: Two times a day (BID) | ORAL | Status: DC
  Administered 2021-03-08 – 2021-03-09 (×3): 20 mg via ORAL
  Filled 2021-03-08 (×3): qty 2

## 2021-03-08 MED ORDER — ALBUTEROL SULFATE HFA 108 (90 BASE) MCG/ACT IN AERS: 2.0000 | INHALATION_SPRAY | Freq: Four times a day (QID) | RESPIRATORY_TRACT | Status: DC | PRN

## 2021-03-08 MED ORDER — FUROSEMIDE 10 MG/ML IJ SOLN
40.0000 mg | Freq: Once | INTRAMUSCULAR | Status: AC
  Administered 2021-03-08: 40 mg via INTRAVENOUS
  Filled 2021-03-08: qty 4
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
## 2021-03-08 NOTE — H&P (Addendum)
History and Physical    [name] UJW:[id number] DOB: 10-03-1960 DOA: 03/07/2021  Referring MD/NP/PA: EDP PCP:  [facility]: [facility]  Chief Complaint: Chest pain and dyspnea  HPI: [name] is a obese 60/F with history of type 2 diabetes mellitus, chronic diastolic CHF, obesity, OSA, remote history of PE, hypertension, dyslipidemia presented to the ED with the above symptoms. -Patient reports that she noticed increased dyspnea on exertion Saturday while walking from parking lot to church which is unusual for her, she had to take a break to catch her breath, she also noticed some swelling in her ankles then, did not exert herself much on Sunday and felt okay then, noticed intermittent periods of chest pain/pressure across her central chest, sometimes radiating to her back, on Monday she walked from parking lot to movie theater and noticed shortness of breath again during a very short distance walk.  Also had intermittent chest pain yesterday, subsequently presented to [facility], CTA chest was negative for PE, small pericardial effusion noted, troponins were negative, EKG noted ST depressions, she had some episodes of chest pain which relieved after Nitropaste. -In addition also has some symptoms of GERD, with heartburn and hoarseness  Review of Systems: As per HPI otherwise 14 point review of systems negative.   Past Medical History:  Diagnosis Date   Anginal pain ([place])    a. NL cath in 2008;  b. Myoview 03/2011: dec uptake along mid anterior wall on stress imaging -> ? attenuation vs. ischemia, EF 65%;  c. 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 2008 -> coumadin x 6 mos.   Rheumatoid arthritis ([place])    Sleep apnea    on CPAP 02/2018   TIA (transient ischemic attack)    Urinary urgency     Past Surgical History:  Procedure Laterality Date   ABDOMINAL HYSTERECTOMY  2005   APPENDECTOMY     CARDIAC CATHETERIZATION     Normal   CARDIAC CATHETERIZATION N/A 09/30/2014   Procedure: Left Heart Cath and Coronary Angiography;  Surgeon: Sherren Mocha, MD;  Location: [facility];  Service: Cardiovascular;  Laterality: N/A;   LAPAROSCOPIC APPENDECTOMY N/A 06/03/2012   Procedure: APPENDECTOMY LAPAROSCOPIC;  Surgeon: Stark Klein, MD;  Location: [facility];  Service: General;  Laterality: N/A;   Left knee surgery  2008   LEG SURGERY     TONSILLECTOMY     TOTAL HIP ARTHROPLASTY Left 06/11/2018   Procedure: LEFT TOTAL HIP ARTHROPLASTY ANTERIOR APPROACH;  Surgeon: Meredith Pel, MD;  Location: [facility];  Service: Orthopedics;  Laterality: Left;  TOTAL KNEE ARTHROPLASTY Left 08/22/2016   Procedure: TOTAL KNEE ARTHROPLASTY;  Surgeon: Meredith Pel, MD;  Location: [facility];  Service: Orthopedics;  Laterality: Left;   TUBAL LIGATION  1989     reports that she quit smoking about 35 years ago. Her smoking use included cigarettes. She has a 7.50 pack-year smoking history. She has never used smokeless tobacco. She reports that she does not currently use drugs after having used the following drugs: Marijuana. She reports that she does not drink alcohol.  Allergies  Allergen Reactions   Penicillins Swelling and Other (See Comments)    Has patient had a PCN reaction causing immediate rash, facial/tongue/throat swelling, SOB or lightheadedness with hypotension: YES Has patient  had a PCN reaction causing severe rash involving mucus membranes or skin necrosis: NO Has patient had a PCN reaction that required hospitalization NO Has patient had a PCN reaction occurring within the last 10 years: NO If all of the above answers are "NO", then may proceed with Cephalosporin use.   Hydrocodone-Acetaminophen Itching    confusion   Latex Rash   Sulfa Antibiotics Diarrhea, Itching and Rash    Family History  Problem Relation Age of Onset   Emphysema Mother    Arthritis Mother    Heart failure Mother        alive @ 56   Stroke Mother    Diabetes Mother    Hypertension Mother    Hyperlipidemia Mother    Depression Mother    Anxiety disorder Mother    Asthma Brother    Heart disease Father        died @ 59's.   Stroke Father    Diabetes Father    Hyperlipidemia Father    Hypertension Father    Bipolar disorder Father    Sleep apnea Father    Alcoholism Father    Drug abuse Father    Diabetes Sister    Heart disease Paternal Grandfather    Colon cancer Maternal Grandfather    Breast cancer Maternal Aunt      Prior to Admission medications   Medication Sig Start Date End Date Taking? Authorizing Provider  albuterol (PROVENTIL HFA;VENTOLIN HFA) 108 (90 Base) MCG/ACT inhaler Inhale 2 puffs into the lungs every 6 (six) hours as needed for wheezing or shortness of breath.   Yes [provider]  aspirin 81 MG chewable tablet Chew 81 mg by mouth daily.   Yes [provider]  atorvastatin (LIPITOR) 40 MG tablet Take 40 mg by mouth at bedtime.    Yes [provider]  busPIRone (BUSPAR) 30 MG tablet Take 30 mg by mouth 2 (two) times daily. 12/18/19  Yes [provider]  carvedilol (COREG) 12.5 MG tablet Take 12.5 mg by mouth daily. 08/11/19  Yes [provider]  diclofenac Sodium (VOLTAREN) 1 % GEL Apply 2 g topically 4 (four) times daily as needed (joint/muscle pain). 09/20/19  Yes [provider]  furosemide (LASIX)  40 MG tablet Take 60 mg by mouth daily.   Yes Rai, Ripudeep K, MD  hydrOXYzine (VISTARIL) 25 MG capsule Take 25 mg by mouth 3 (three) times daily as needed for itching.   Yes [provider]  isosorbide dinitrate (ISORDIL) 20 MG tablet Take 20 mg by mouth 2 (two) times daily.   Yes [provider]  JENTADUETO XR 2.08-998 MG TB24 Take 1 tablet by mouth daily. 12/13/20  Yes [provider]  lidocaine (LIDODERM) 5 % Place 1 patch onto  the skin daily as needed for pain. 02/11/21  Yes [provider]  lisinopril (PRINIVIL,ZESTRIL) 20 MG tablet Take 20 mg by mouth daily.  09/21/14  Yes [provider]  loperamide (IMODIUM A-D) 2 MG tablet Take 4 mg by mouth 4 (four) times daily as needed for diarrhea or loose stools.    Yes [provider]  meloxicam (MOBIC) 15 MG tablet Take 15 mg by mouth daily. 12/05/19  Yes [provider]  MYRBETRIQ 25 MG TB24 tablet Take 1 tablet (25 mg total) by mouth daily. 03/13/19  Yes Beasley, Caren D, MD  nystatin (MYCOSTATIN/NYSTOP) powder Apply 1 application topically 3 (three) times daily as needed for rash.   Yes [provider]  ondansetron (ZOFRAN) 4 MG tablet Take 1 tablet (4 mg total) by mouth every 6 (six) hours. Patient taking differently: Take 4 mg by mouth every 6 (six) hours as needed for nausea or vomiting. 09/03/20  Yes Plunkett, Loree Fee, MD  potassium chloride (KLOR-CON M10) 10 MEQ tablet Take 1 tablet (10 mEq total) by mouth 2 (two) times daily. 05/27/18  Yes Jearld Lesch A, DO  topiramate (TOPAMAX) 25 MG tablet Take 1 tablet (25 mg total) by mouth at bedtime. 04/12/20  Yes Opalski, Deborah, DO  Semaglutide,0.25 or 0.5MG /DOS, (OZEMPIC, 0.25 OR 0.5 MG/DOSE,) 2 MG/1.5ML SOPN Inject 0.5 mg into the skin once a week. DX:E11.69 Patient not taking: No sig reported 04/12/20   Mellody Dance, DO    Physical Exam: Vitals:   03/08/21 0600 03/08/21 0635 03/08/21 0646 03/08/21 0812  BP: 135/74 (!)  123/56  (!) 117/55  Pulse: 74 71  64  Resp: 17 13  17   Temp:   98.5 F (36.9 C) 97.9 F (36.6 C)  TempSrc:   Oral Oral  SpO2: 99% 100%  100%  Weight:      Height:          Constitutional: NAD, calm, comfortable HEENT:no JVD Respiratory: clear to auscultation bilaterally Cardiovascular: S1S2/RRR Abdomen: soft, non tender, Bowel sounds positive.  Musculoskeletal: No joint deformity upper and lower extremities. Ext: no edema Skin: no rashes Neurologic: CN 2-12 grossly intact. Sensation intact, DTR normal. Strength 5/5 in all 4.  Psychiatric: Normal judgment and insight. Alert and oriented x 3. Normal mood.   Labs on Admission: I have personally reviewed following labs and imaging studies  CBC: Recent Labs  Lab 03/07/21 2018  WBC 6.2  HGB 12.4  HCT 37.9  MCV 88.6  PLT 465   Basic Metabolic Panel: Recent Labs  Lab 03/07/21 2018  NA 142  K 3.5  CL 103  CO2 30  GLUCOSE 245*  BUN 13  CREATININE 1.03*  CALCIUM 9.2   GFR: Estimated Creatinine Clearance: 58.3 mL/min (A) (by C-G formula based on SCr of 1.03 mg/dL (H)). Liver Function Tests: Recent Labs  Lab 03/07/21 2018  AST 22  ALT 36  ALKPHOS 51  BILITOT 0.5  PROT 6.5  ALBUMIN 4.1   No results for input(s): LIPASE, AMYLASE in the last 168 hours. No results for input(s): AMMONIA in the last 168 hours. Coagulation Profile: No results for input(s): INR, PROTIME in the last 168 hours. Cardiac Enzymes: No results for input(s): CKTOTAL, CKMB, CKMBINDEX, TROPONINI in the last 168 hours. BNP (last 3 results) No results for input(s): PROBNP in the last 8760 hours. HbA1C: No results for input(s): HGBA1C in the last 72 hours. CBG: Recent Labs  Lab 03/08/21 0844  GLUCAP 155*   Lipid Profile: No results for  input(s): CHOL, HDL, LDLCALC, TRIG, CHOLHDL, LDLDIRECT in the last 72 hours. Thyroid Function Tests: No results for input(s): TSH, T4TOTAL, FREET4, T3FREE, THYROIDAB in the last 72 hours. Anemia  Panel: No results for input(s): VITAMINB12, FOLATE, FERRITIN, TIBC, IRON, RETICCTPCT in the last 72 hours. Urine analysis:    Component Value Date/Time   COLORURINE YELLOW 08/14/2019 2059   APPEARANCEUR HAZY (A) 08/14/2019 2059   LABSPEC 1.012 08/14/2019 2059   PHURINE 5.0 08/14/2019 2059   GLUCOSEU 50 (A) 08/14/2019 2059   HGBUR NEGATIVE 08/14/2019 2059   BILIRUBINUR NEGATIVE 08/14/2019 2059   KETONESUR NEGATIVE 08/14/2019 2059   PROTEINUR NEGATIVE 08/14/2019 2059   UROBILINOGEN 0.2 09/27/2014 1920   NITRITE NEGATIVE 08/14/2019 2059   LEUKOCYTESUR NEGATIVE 08/14/2019 2059   Sepsis Labs: @LABRCNTIP (procalcitonin:4,lacticidven:4) ) Recent Results (from the past 240 hour(s))  Resp Panel by RT-PCR (Flu A&B, Covid) Nasopharyngeal Swab     Status: None   Collection Time: 03/07/21 10:39 PM   Specimen: Nasopharyngeal Swab; Nasopharyngeal(NP) swabs in vial transport medium  Result Value Ref Range Status   SARS Coronavirus 2 by RT PCR NEGATIVE NEGATIVE Final    Comment: (NOTE) SARS-CoV-2 target nucleic acids are NOT DETECTED.  The SARS-CoV-2 RNA is generally detectable in upper respiratory specimens during the acute phase of infection. The lowest concentration of SARS-CoV-2 viral copies this assay can detect is 138 copies/mL. A negative result does not preclude SARS-Cov-2 infection and should not be used as the sole basis for treatment or other patient management decisions. A negative result may occur with  improper specimen collection/handling, submission of specimen other than nasopharyngeal swab, presence of viral mutation(s) within the areas targeted by this assay, and inadequate number of viral copies(<138 copies/mL). A negative result must be combined with clinical observations, patient history, and epidemiological information. The expected result is Negative.  Fact Sheet for Patients:  EntrepreneurPulse.com.au  Fact Sheet for Healthcare Providers:   IncredibleEmployment.be  This test is no t yet approved or cleared by the [place] FDA and  has been authorized for detection and/or diagnosis of SARS-CoV-2 by FDA under an Emergency Use Authorization (EUA). This EUA will remain  in effect (meaning this test can be used) for the duration of the COVID-19 declaration under Section 564(b)(1) of the Act, 21 U.S.C.section 360bbb-3(b)(1), unless the authorization is terminated  or revoked sooner.       Influenza A by PCR NEGATIVE NEGATIVE Final   Influenza B by PCR NEGATIVE NEGATIVE Final    Comment: (NOTE) The Xpert Xpress SARS-CoV-2/FLU/RSV plus assay is intended as an aid in the diagnosis of influenza from Nasopharyngeal swab specimens and should not be used as a sole basis for treatment. Nasal washings and aspirates are unacceptable for Xpert Xpress SARS-CoV-2/FLU/RSV testing.  Fact Sheet for Patients: EntrepreneurPulse.com.au  Fact Sheet for Healthcare Providers: IncredibleEmployment.be  This test is not yet approved or cleared by the [place] FDA and has been authorized for detection and/or diagnosis of SARS-CoV-2 by FDA under an Emergency Use Authorization (EUA). This EUA will remain in effect (meaning this test can be used) for the duration of the COVID-19 declaration under Section 564(b)(1) of the Act, 21 U.S.C. section 360bbb-3(b)(1), unless the authorization is terminated or revoked.  Performed at [facility], [address], [place], [place] [address]      Radiological Exams on Admission: DG Chest 2 View  Result Date: 03/07/2021 CLINICAL DATA:  Chest pain, shortness of breath EXAM: CHEST - 2 VIEW COMPARISON:  10/14/2020 FINDINGS: Heart  and mediastinal contours are within normal limits. No focal opacities or effusions. No acute bony abnormality. IMPRESSION: No active cardiopulmonary disease. Electronically Signed   By: Rolm Baptise  M.D.   On: 03/07/2021 19:21   CT Angio Chest PE W and/or Wo Contrast  Result Date: 03/07/2021 CLINICAL DATA:  PE suspected, high prob ResultsHistory of PE, similar pain or shortness of breath. Recent left leg pain and swelling. EXAM: CT ANGIOGRAPHY CHEST WITH CONTRAST TECHNIQUE: Multidetector CT imaging of the chest was performed using the standard protocol during bolus administration of intravenous contrast. Multiplanar CT image reconstructions and MIPs were obtained to evaluate the vascular anatomy. CONTRAST:  164mL OMNIPAQUE IOHEXOL 350 MG/ML SOLN COMPARISON:  Radiograph earlier today.  Chest CTA 10/14/2020 FINDINGS: Cardiovascular: There are no filling defects within the pulmonary arteries to suggest pulmonary embolus. Subsegmental branches are suboptimally assessed due to soft tissue attenuation from habitus. Stable prominent main pulmonary artery at 3 cm. Normal caliber thoracic aorta. Common origin of the brachiocephalic and left common carotid artery, variant arch anatomy. Normal heart size. Small pericardial effusion measures up to 13 mm in depth adjacent to the right ventricle. This has slightly increased in size from prior exam Mediastinum/Nodes: Stable rounded retrosternal density that is likely exophytic from enlarged right lobe of the thyroid gland. The previous 3.4 cm right thyroid nodule is not well-defined on the current exam. Prior thyroid ultrasound, 05/27/2020. Similar leftward tracheal deviation. Small mediastinal and hilar lymph nodes are not enlarged by size criteria. There is mild distal esophageal wall thickening. Lungs/Pleura: Mild lingular scarring and subsegmental atelectasis in the lower lobes. No consolidation or evidence of pneumonia. No findings of pulmonary edema. No pleural effusion. The trachea and central bronchi are patent. Upper Abdomen: Prominent liver, partially included. No acute upper abdominal findings. Musculoskeletal: Mild thoracic spondylosis with endplate spurring.  There are no acute or suspicious osseous abnormalities. Review of the MIP images confirms the above findings. IMPRESSION: 1. No pulmonary embolus. 2. Small pericardial effusion, slightly increased in size from prior exam. 3. Mild distal esophageal wall thickening, can be seen with reflux or esophagitis. Electronically Signed   By: Keith Rake M.D.   On: 03/07/2021 23:28   US Venous Img Lower Unilateral Left  Result Date: 03/07/2021 CLINICAL DATA:  Leg swelling. EXAM: BILATERAL LOWER EXTREMITY VENOUS DOPPLER ULTRASOUND TECHNIQUE: Gray-scale sonography with compression, as well as color and duplex ultrasound, were performed to evaluate the deep venous system(s) from the level of the common femoral vein through the popliteal and proximal calf veins. COMPARISON:  None. FINDINGS: VENOUS Normal compressibility of the common femoral, superficial femoral, and popliteal veins, as well as the visualized calf veins. Visualized portions of profunda femoral vein and great saphenous vein unremarkable. No filling defects to suggest DVT on grayscale or color Doppler imaging. Doppler waveforms show normal direction of venous flow, normal respiratory plasticity and response to augmentation. Limited views of the contralateral common femoral vein are unremarkable. OTHER None. Limitations: none IMPRESSION: Negative. Electronically Signed   By: Ronney Asters M.D.   On: 03/07/2021 23:46    EKG: Independently reviewed. NSR  Assessment/Plan Active Problems:  Atypical chest pain -With some typical features and some symptoms of GERD -EKG noted ST depression in inferior leads, CT chest noted small pericardial effusion -High-sensitivity troponin is negative -Stress test in 12/20 was unremarkable -She has multiple cardiac risk factors, and has a small pericardial effusion on CT, will request cardiology evaluation -Add PPI twice daily  Acute on chronic diastolic CHF -  Patient reports increased leg swelling in the last few  days, abdominal tightness with dyspnea on exertion,  improved today -Does not appear considerably volume overloaded at this time -IV Lasix x1 -Repeat echo  GERD with some symptoms of heartburn -Add PPI, twice daily for now, transition to daily at discharge  Obesity, OSA -Needs weight loss, CPAP nightly  Type 2 diabetes mellitus -Oral hypoglycemics on hold, sliding scale insulin for now, check A1c  Hypertension -Continue beta-blocker, Imdur,  hold lisinopril as she got contrast today  History of pulmonary embolism in 2008 -Was on Coumadin for 6 months, CTA negative for PE now  History of thyroid nodules -Follow-up with endocrinology  DVT prophylaxis: Lovenox Code Status: Full code Family Communication: Discussed patient in detail, no family at bedside Disposition Plan: Home pending work-up Consults called: Cardiology Admission status: Observation  Domenic Polite MD [facility]   03/08/2021, 11:14 AM

## 2021-03-08 NOTE — ED Notes (Signed)
Pt called out c/o chest pain. Dr Stark Jock notified, new orders received.

## 2021-03-08 NOTE — ED Notes (Signed)
Pt reports that pain feels like "indigestion"

## 2021-03-09 ENCOUNTER — Observation Stay (HOSPITAL_BASED_OUTPATIENT_CLINIC_OR_DEPARTMENT_OTHER): Payer: Medicare Other

## 2021-03-09 DIAGNOSIS — R0609 Other forms of dyspnea: Secondary | ICD-10-CM

## 2021-03-09 DIAGNOSIS — R072 Precordial pain: Secondary | ICD-10-CM | POA: Diagnosis not present

## 2021-03-09 DIAGNOSIS — R079 Chest pain, unspecified: Secondary | ICD-10-CM | POA: Diagnosis not present

## 2021-03-09 LAB — ECHOCARDIOGRAM COMPLETE
Area-P 1/2: 4.49 cm2
Calc EF: 49.2 %
Height: 59 in
S' Lateral: 3.2 cm
Single Plane A2C EF: 44.3 %
Single Plane A4C EF: 50.9 %
Weight: 3392 oz

## 2021-03-09 LAB — COMPREHENSIVE METABOLIC PANEL
ALT: 31 U/L (ref 0–44)
AST: 18 U/L (ref 15–41)
Albumin: 3 g/dL — ABNORMAL LOW (ref 3.5–5.0)
Alkaline Phosphatase: 49 U/L (ref 38–126)
Anion gap: 7 (ref 5–15)
BUN: 17 mg/dL (ref 8–23)
CO2: 29 mmol/L (ref 22–32)
Calcium: 8.9 mg/dL (ref 8.9–10.3)
Chloride: 103 mmol/L (ref 98–111)
Creatinine, Ser: 1.15 mg/dL — ABNORMAL HIGH (ref 0.44–1.00)
GFR, Estimated: 54 mL/min — ABNORMAL LOW (ref >60)
Glucose, Bld: 191 mg/dL — ABNORMAL HIGH (ref 70–99)
Potassium: 3.8 mmol/L (ref 3.5–5.1)
Sodium: 139 mmol/L (ref 135–145)
Total Bilirubin: 0.6 mg/dL (ref 0.3–1.2)
Total Protein: 5.6 g/dL — ABNORMAL LOW (ref 6.5–8.1)

## 2021-03-09 LAB — CBC
HCT: 35.9 % — ABNORMAL LOW (ref 36.0–46.0)
Hemoglobin: 11.8 g/dL — ABNORMAL LOW (ref 12.0–15.0)
MCH: 29.3 pg (ref 26.0–34.0)
MCHC: 32.9 g/dL (ref 30.0–36.0)
MCV: 89.1 fL (ref 80.0–100.0)
Platelets: 212 10*3/uL (ref 150–400)
RBC: 4.03 MIL/uL (ref 3.87–5.11)
RDW: 12.6 % (ref 11.5–15.5)
WBC: 6.1 10*3/uL (ref 4.0–10.5)
nRBC: 0 % (ref 0.0–0.2)

## 2021-03-09 LAB — GLUCOSE, CAPILLARY
Glucose-Capillary: 202 mg/dL — ABNORMAL HIGH (ref 70–99)
Glucose-Capillary: 168 mg/dL — ABNORMAL HIGH (ref 70–99)

## 2021-03-09 MED ORDER — LIDOCAINE VISCOUS HCL 2 % MT SOLN
15.0000 mL | Freq: Once | OROMUCOSAL | Status: AC
  Administered 2021-03-09: 15 mL via ORAL
  Filled 2021-03-09: qty 15

## 2021-03-09 MED ORDER — ALUM & MAG HYDROXIDE-SIMETH 200-200-20 MG/5ML PO SUSP
30.0000 mL | Freq: Once | ORAL | Status: AC
  Administered 2021-03-09: 30 mL via ORAL
  Filled 2021-03-09: qty 30

## 2021-03-09 MED ORDER — PANTOPRAZOLE SODIUM 40 MG PO TBEC: 40.0000 mg | DELAYED_RELEASE_TABLET | Freq: Two times a day (BID) | ORAL | 0 refills | Status: DC

## 2021-03-09 NOTE — Discharge Summary (Signed)
Physician Discharge Summary  [name] TGG:[id number] DOB: [birth year] DOA: 03/07/2021  PCP: Sonia Side., FNP  Admit date: 03/07/2021 Discharge date: 03/09/2021  Time spent: 71minutes  Recommendations for Outpatient Follow-up:  Referral sent to gastroenterology for endoscopy PCP in 1 week Caution with frequent chest CTs in the absence of other concerning symptoms, already had for CTAs of chest in 2022 Needs endocrinology follow-up for thyroid nodules   Discharge Diagnoses:  Noncardiac chest pain Atypical chest pain Chronic diastolic CHF Morbid obesity Obstructive sleep apnea Remote history of pulmonary embolism GERD Type 2 diabetes mellitus Hypertension History of thyroid nodules  Discharge Condition: Stable  Diet recommendation: Low-sodium, diabetic  Filed Weights   03/07/21 1854  Weight: 96.2 kg    History of present illness:  [name] is a obese 60/F with history of type 2 diabetes mellitus, chronic diastolic CHF, obesity, OSA, remote history of PE, hypertension, dyslipidemia presented to the ED with the above symptoms. -Patient reports that she noticed increased dyspnea on exertion Saturday while walking from parking lot to church which is unusual for her, she had to take a break to catch her breath, she also noticed some swelling in her ankles then, did not exert herself much on Sunday and felt okay then, noticed intermittent periods of chest pain/pressure across her central chest, sometimes radiating to her back, on Monday she walked from parking lot to movie theater and noticed shortness of breath again during a very short distance walk.  Also had intermittent chest pain yesterday, subsequently presented to [facility], CTA chest was negative for PE, small pericardial effusion noted, troponins were negative, EKG noted ST depressions, she had some episodes of chest pain which relieved after Nitropaste. -In addition also has some symptoms of  GERD, with heartburn and hoarseness    Hospital Course:   Atypical chest pain -With some typical features and some symptoms of GERD -Ruled out for ACS again, high-sensitivity troponin was negative X4 -EKG noted nonspecific ST-T wave changes, CT chest noted small pericardial effusion -Stress test in 12/20 was normal, previously had a cardiac cath in 2008 and 2016 which noted normal coronaries -Seen by cardiology in consultation, recommended echocardiogram and no further cardiac work-up if this was normal -Since she also had some symptoms of GERD she was started on PPI twice daily, gastroenterology referral sent for endoscopy   Acute on chronic diastolic CHF -Patient reports increased leg swelling in the last few days, abdominal tightness with dyspnea on exertion,  improved today -Does not appear considerably volume overloaded at this time -Repeat echo -Resumed home dose of Lasix   GERD with some symptoms of heartburn -Add PPI, twice daily for now, can transition to daily in a few weeks   Obesity, OSA -Needs weight loss, CPAP nightly   Type 2 diabetes mellitus -Oral hypoglycemics resumed at discharge  Hypertension -Continue beta-blocker, Imdur,  -lisinopril resumed at discharge   History of pulmonary embolism in 2008 -Was on Coumadin for 6 months, CTA negative for PE now   History of thyroid nodules -Follow-up with endocrinology    Discharge Exam: Vitals:   03/09/21 0722 03/09/21 1140  BP: (!) 154/89 (!) 141/75  Pulse: 62 66  Resp: 16 15  Temp: 98.2 F (36.8 C) 98.2 F (36.8 C)  SpO2: 98% 98%   Gen: Obese pleasant female sitting up in bed awake, Alert, Oriented X 3, no distress HEENT: no JVD Lungs: Good air movement bilaterally, CTAB CVS: S1S2/RRR Abd: soft, Non tender,  non distended, BS present Extremities: No edema Skin: no new rashes on exposed skin    Discharge Instructions   Discharge Instructions     Ambulatory referral to Gastroenterology    Complete by: As directed    GERD, recurrent non cardiac and Pulm chest pain   What is the reason for referral?: Other      Allergies as of 03/09/2021       Reactions   Penicillins Swelling, Other (See Comments)   Has patient had a PCN reaction causing immediate rash, facial/tongue/throat swelling, SOB or lightheadedness with hypotension: YES Has patient had a PCN reaction causing severe rash involving mucus membranes or skin necrosis: NO Has patient had a PCN reaction that required hospitalization NO Has patient had a PCN reaction occurring within the last 10 years: NO If all of the above answers are "NO", then may proceed with Cephalosporin use.   Hydrocodone-acetaminophen Itching   confusion   Latex Rash   Sulfa Antibiotics Diarrhea, Itching, Rash        Medication List     STOP taking these medications    meloxicam 15 MG tablet Commonly known as: MOBIC       TAKE these medications    albuterol 108 (90 Base) MCG/ACT inhaler Commonly known as: VENTOLIN HFA Inhale 2 puffs into the lungs every 6 (six) hours as needed for wheezing or shortness of breath.   aspirin 81 MG chewable tablet Chew 81 mg by mouth daily.   atorvastatin 40 MG tablet Commonly known as: LIPITOR Take 40 mg by mouth at bedtime.   busPIRone 30 MG tablet Commonly known as: BUSPAR Take 30 mg by mouth 2 (two) times daily.   carvedilol 12.5 MG tablet Commonly known as: COREG Take 12.5 mg by mouth daily.   diclofenac Sodium 1 % Gel Commonly known as: VOLTAREN Apply 2 g topically 4 (four) times daily as needed (joint/muscle pain).   furosemide 40 MG tablet Commonly known as: LASIX Take 60 mg by mouth daily.   hydrOXYzine 25 MG capsule Commonly known as: VISTARIL Take 25 mg by mouth 3 (three) times daily as needed for itching.   isosorbide dinitrate 20 MG tablet Commonly known as: ISORDIL Take 20 mg by mouth 2 (two) times daily.   Jentadueto XR 2.08-998 MG Tb24 Generic drug:  linaGLIPtin-metFORMIN HCl ER Take 1 tablet by mouth daily.   lidocaine 5 % Commonly known as: LIDODERM Place 1 patch onto the skin daily as needed for pain.   lisinopril 20 MG tablet Commonly known as: ZESTRIL Take 20 mg by mouth daily.   loperamide 2 MG tablet Commonly known as: IMODIUM A-D Take 4 mg by mouth 4 (four) times daily as needed for diarrhea or loose stools.   Myrbetriq 25 MG Tb24 tablet Generic drug: mirabegron ER Take 1 tablet (25 mg total) by mouth daily.   nystatin powder Commonly known as: MYCOSTATIN/NYSTOP Apply 1 application topically 3 (three) times daily as needed for rash.   ondansetron 4 MG tablet Commonly known as: ZOFRAN Take 1 tablet (4 mg total) by mouth every 6 (six) hours. What changed:  when to take this reasons to take this   Ozempic (0.25 or 0.5 MG/DOSE) 2 MG/1.5ML Sopn Generic drug: Semaglutide(0.25 or 0.5MG /DOS) Inject 0.5 mg into the skin once a week. DX:E11.69   pantoprazole 40 MG tablet Commonly known as: PROTONIX Take 1 tablet (40 mg total) by mouth 2 (two) times daily.   potassium chloride 10 MEQ tablet Commonly known as: Klor-Con  M10 Take 1 tablet (10 mEq total) by mouth 2 (two) times daily.   topiramate 25 MG tablet Commonly known as: TOPAMAX Take 1 tablet (25 mg total) by mouth at bedtime.       Allergies  Allergen Reactions   Penicillins Swelling and Other (See Comments)    Has patient had a PCN reaction causing immediate rash, facial/tongue/throat swelling, SOB or lightheadedness with hypotension: YES Has patient had a PCN reaction causing severe rash involving mucus membranes or skin necrosis: NO Has patient had a PCN reaction that required hospitalization NO Has patient had a PCN reaction occurring within the last 10 years: NO If all of the above answers are "NO", then may proceed with Cephalosporin use.   Hydrocodone-Acetaminophen Itching    confusion   Latex Rash   Sulfa Antibiotics Diarrhea, Itching and Rash     Follow-up Information     Sonia Side., FNP. Schedule an appointment as soon as possible for a visit in 1 week(s).   Specialty: Family Medicine Contact information: [place] [address] [phone]         Josue Hector, MD. Schedule an appointment as soon as possible for a visit in 1 month(s).   Specialty: Cardiology Contact information: [address] [place] [address] [phone]         Gastroenterology Follow up in 2 week(s).   Why: referral sent, Office will call you                 The results of significant diagnostics from this hospitalization (including imaging, microbiology, ancillary and laboratory) are listed below for reference.    Significant Diagnostic Studies: DG Chest 2 View  Result Date: 03/07/2021 CLINICAL DATA:  Chest pain, shortness of breath EXAM: CHEST - 2 VIEW COMPARISON:  10/14/2020 FINDINGS: Heart and mediastinal contours are within normal limits. No focal opacities or effusions. No acute bony abnormality. IMPRESSION: No active cardiopulmonary disease. Electronically Signed   By: Rolm Baptise M.D.   On: 03/07/2021 19:21   CT Angio Chest PE W and/or Wo Contrast  Result Date: 03/07/2021 CLINICAL DATA:  PE suspected, high prob ResultsHistory of PE, similar pain or shortness of breath. Recent left leg pain and swelling. EXAM: CT ANGIOGRAPHY CHEST WITH CONTRAST TECHNIQUE: Multidetector CT imaging of the chest was performed using the standard protocol during bolus administration of intravenous contrast. Multiplanar CT image reconstructions and MIPs were obtained to evaluate the vascular anatomy. CONTRAST:  111mL OMNIPAQUE IOHEXOL 350 MG/ML SOLN COMPARISON:  Radiograph earlier today.  Chest CTA 10/14/2020 FINDINGS: Cardiovascular: There are no filling defects within the pulmonary arteries to suggest pulmonary embolus. Subsegmental branches are suboptimally assessed due to soft tissue attenuation from  habitus. Stable prominent main pulmonary artery at 3 cm. Normal caliber thoracic aorta. Common origin of the brachiocephalic and left common carotid artery, variant arch anatomy. Normal heart size. Small pericardial effusion measures up to 13 mm in depth adjacent to the right ventricle. This has slightly increased in size from prior exam Mediastinum/Nodes: Stable rounded retrosternal density that is likely exophytic from enlarged right lobe of the thyroid gland. The previous 3.4 cm right thyroid nodule is not well-defined on the current exam. Prior thyroid ultrasound, 05/27/2020. Similar leftward tracheal deviation. Small mediastinal and hilar lymph nodes are not enlarged by size criteria. There is mild distal esophageal wall thickening. Lungs/Pleura: Mild lingular scarring and subsegmental atelectasis in the lower lobes. No consolidation or evidence of pneumonia. No findings  of pulmonary edema. No pleural effusion. The trachea and central bronchi are patent. Upper Abdomen: Prominent liver, partially included. No acute upper abdominal findings. Musculoskeletal: Mild thoracic spondylosis with endplate spurring. There are no acute or suspicious osseous abnormalities. Review of the MIP images confirms the above findings. IMPRESSION: 1. No pulmonary embolus. 2. Small pericardial effusion, slightly increased in size from prior exam. 3. Mild distal esophageal wall thickening, can be seen with reflux or esophagitis. Electronically Signed   By: Keith Rake M.D.   On: 03/07/2021 23:28   US Venous Img Lower Unilateral Left  Result Date: 03/07/2021 CLINICAL DATA:  Leg swelling. EXAM: BILATERAL LOWER EXTREMITY VENOUS DOPPLER ULTRASOUND TECHNIQUE: Gray-scale sonography with compression, as well as color and duplex ultrasound, were performed to evaluate the deep venous system(s) from the level of the common femoral vein through the popliteal and proximal calf veins. COMPARISON:  None. FINDINGS: VENOUS Normal  compressibility of the common femoral, superficial femoral, and popliteal veins, as well as the visualized calf veins. Visualized portions of profunda femoral vein and great saphenous vein unremarkable. No filling defects to suggest DVT on grayscale or color Doppler imaging. Doppler waveforms show normal direction of venous flow, normal respiratory plasticity and response to augmentation. Limited views of the contralateral common femoral vein are unremarkable. OTHER None. Limitations: none IMPRESSION: Negative. Electronically Signed   By: Ronney Asters M.D.   On: 03/07/2021 23:46    Microbiology: Recent Results (from the past 240 hour(s))  Resp Panel by RT-PCR (Flu A&B, Covid) Nasopharyngeal Swab     Status: None   Collection Time: 03/07/21 10:39 PM   Specimen: Nasopharyngeal Swab; Nasopharyngeal(NP) swabs in vial transport medium  Result Value Ref Range Status   SARS Coronavirus 2 by RT PCR NEGATIVE NEGATIVE Final    Comment: (NOTE) SARS-CoV-2 target nucleic acids are NOT DETECTED.  The SARS-CoV-2 RNA is generally detectable in upper respiratory specimens during the acute phase of infection. The lowest concentration of SARS-CoV-2 viral copies this assay can detect is 138 copies/mL. A negative result does not preclude SARS-Cov-2 infection and should not be used as the sole basis for treatment or other patient management decisions. A negative result may occur with  improper specimen collection/handling, submission of specimen other than nasopharyngeal swab, presence of viral mutation(s) within the areas targeted by this assay, and inadequate number of viral copies(<138 copies/mL). A negative result must be combined with clinical observations, patient history, and epidemiological information. The expected result is Negative.  Fact Sheet for Patients:  EntrepreneurPulse.com.au  Fact Sheet for Healthcare Providers:  IncredibleEmployment.be  This test is  no t yet approved or cleared by the [place] FDA and  has been authorized for detection and/or diagnosis of SARS-CoV-2 by FDA under an Emergency Use Authorization (EUA). This EUA will remain  in effect (meaning this test can be used) for the duration of the COVID-19 declaration under Section 564(b)(1) of the Act, 21 U.S.C.section 360bbb-3(b)(1), unless the authorization is terminated  or revoked sooner.       Influenza A by PCR NEGATIVE NEGATIVE Final   Influenza B by PCR NEGATIVE NEGATIVE Final    Comment: (NOTE) The Xpert Xpress SARS-CoV-2/FLU/RSV plus assay is intended as an aid in the diagnosis of influenza from Nasopharyngeal swab specimens and should not be used as a sole basis for treatment. Nasal washings and aspirates are unacceptable for Xpert Xpress SARS-CoV-2/FLU/RSV testing.  Fact Sheet for Patients: EntrepreneurPulse.com.au  Fact Sheet for Healthcare Providers: IncredibleEmployment.be  This test is not  yet approved or cleared by the [place] and has been authorized for detection and/or diagnosis of SARS-CoV-2 by FDA under an Emergency Use Authorization (EUA). This EUA will remain in effect (meaning this test can be used) for the duration of the COVID-19 declaration under Section 564(b)(1) of the Act, 21 U.S.C. section 360bbb-3(b)(1), unless the authorization is terminated or revoked.  Performed at [facility], [address], [place], [place] [address]   MRSA Next Gen by PCR, Nasal     Status: None   Collection Time: 03/08/21  9:57 AM   Specimen: Nasal Mucosa; Nasal Swab  Result Value Ref Range Status   MRSA by PCR Next Gen NOT DETECTED NOT DETECTED Final    Comment: (NOTE) The GeneXpert MRSA Assay (FDA approved for NASAL specimens only), is one component of a comprehensive MRSA colonization surveillance program. It is not intended to diagnose MRSA infection nor to guide or monitor  treatment for MRSA infections. Test performance is not FDA approved in patients less than 48 years old. Performed at [facility], [place] [address], [place], [place] [address]      Labs: Basic Metabolic Panel: Recent Labs  Lab 03/07/21 2018 03/08/21 1131 03/09/21 0057  NA 142  --  139  K 3.5  --  3.8  CL 103  --  103  CO2 30  --  29  GLUCOSE 245*  --  191*  BUN 13  --  17  CREATININE 1.03* 0.89 1.15*  CALCIUM 9.2  --  8.9   Liver Function Tests: Recent Labs  Lab 03/07/21 2018 03/09/21 0057  AST 22 18  ALT 36 31  ALKPHOS 51 49  BILITOT 0.5 0.6  PROT 6.5 5.6*  ALBUMIN 4.1 3.0*   No results for input(s): LIPASE, AMYLASE in the last 168 hours. No results for input(s): AMMONIA in the last 168 hours. CBC: Recent Labs  Lab 03/07/21 2018 03/08/21 1131 03/09/21 0057  WBC 6.2 5.7 6.1  HGB 12.4 12.0 11.8*  HCT 37.9 36.9 35.9*  MCV 88.6 89.6 89.1  PLT 210 195 212   Cardiac Enzymes: No results for input(s): CKTOTAL, CKMB, CKMBINDEX, TROPONINI in the last 168 hours. BNP: BNP (last 3 results) Recent Labs    09/03/20 0146 03/07/21 2239 03/08/21 1131  BNP 40.2 50.1 47.6    ProBNP (last 3 results) No results for input(s): PROBNP in the last 8760 hours.  CBG: Recent Labs  Lab 03/08/21 1124 03/08/21 1622 03/08/21 2130 03/09/21 0638 03/09/21 1144  GLUCAP 147* 187* 207* 202* 168*       Signed:  Domenic Polite MD.  [facility] 03/09/2021, 2:09 PM

## 2021-03-09 NOTE — Plan of Care (Signed)

## 2021-03-09 NOTE — Progress Notes (Signed)
Subjective:  No chest pain waiting on echo   Objective:  Vitals:   03/08/21 2013 03/08/21 2249 03/08/21 2339 03/09/21 0405  BP: (!) 134/55  138/77 139/74  Pulse: 75 71 64 61  Resp: 16 (!) 21 16 20   Temp: 98 F (36.7 C)  97.9 F (36.6 C) 98.1 F (36.7 C)  TempSrc: Oral  Oral Oral  SpO2: 98% 95% 95% 98%  Weight:      Height:        Intake/Output from previous day:  Intake/Output Summary (Last 24 hours) at 03/09/2021 0911 Last data filed at 03/09/2021 0300 Gross per 24 hour  Intake 480 ml  Output 1700 ml  Net -1220 ml    Physical Exam: Obese black female No murmur  Lungs clear anteriorly Abdomen benign  Trace edema  Lab Results: Basic Metabolic Panel: Recent Labs    03/07/21 2018 03/08/21 1131 03/09/21 0057  NA 142  --  139  K 3.5  --  3.8  CL 103  --  103  CO2 30  --  29  GLUCOSE 245*  --  191*  BUN 13  --  17  CREATININE 1.03* 0.89 1.15*  CALCIUM 9.2  --  8.9   Liver Function Tests: Recent Labs    03/07/21 2018 03/09/21 0057  AST 22 18  ALT 36 31  ALKPHOS 51 49  BILITOT 0.5 0.6  PROT 6.5 5.6*  ALBUMIN 4.1 3.0*   No results for input(s): LIPASE, AMYLASE in the last 72 hours. CBC: Recent Labs    03/08/21 1131 03/09/21 0057  WBC 5.7 6.1  HGB 12.0 11.8*  HCT 36.9 35.9*  MCV 89.6 89.1  PLT 195 212   Cardiac Enzymes: No results for input(s): CKTOTAL, CKMB, CKMBINDEX, TROPONINI in the last 72 hours. BNP: Invalid input(s): POCBNP D-Dimer: No results for input(s): DDIMER in the last 72 hours. Hemoglobin A1C: Recent Labs    03/08/21 1131  HGBA1C 8.4*     Imaging: DG Chest 2 View  Result Date: 03/07/2021 CLINICAL DATA:  Chest pain, shortness of breath EXAM: CHEST - 2 VIEW COMPARISON:  10/14/2020 FINDINGS: Heart and mediastinal contours are within normal limits. No focal opacities or effusions. No acute bony abnormality. IMPRESSION: No active cardiopulmonary disease. Electronically Signed   By: Rolm Baptise M.D.   On: 03/07/2021  19:21   CT Angio Chest PE W and/or Wo Contrast  Result Date: 03/07/2021 CLINICAL DATA:  PE suspected, high prob ResultsHistory of PE, similar pain or shortness of breath. Recent left leg pain and swelling. EXAM: CT ANGIOGRAPHY CHEST WITH CONTRAST TECHNIQUE: Multidetector CT imaging of the chest was performed using the standard protocol during bolus administration of intravenous contrast. Multiplanar CT image reconstructions and MIPs were obtained to evaluate the vascular anatomy. CONTRAST:  157mL OMNIPAQUE IOHEXOL 350 MG/ML SOLN COMPARISON:  Radiograph earlier today.  Chest CTA 10/14/2020 FINDINGS: Cardiovascular: There are no filling defects within the pulmonary arteries to suggest pulmonary embolus. Subsegmental branches are suboptimally assessed due to soft tissue attenuation from habitus. Stable prominent main pulmonary artery at 3 cm. Normal caliber thoracic aorta. Common origin of the brachiocephalic and left common carotid artery, variant arch anatomy. Normal heart size. Small pericardial effusion measures up to 13 mm in depth adjacent to the right ventricle. This has slightly increased in size from prior exam Mediastinum/Nodes: Stable rounded retrosternal density that is likely exophytic from enlarged right lobe of the thyroid gland. The previous 3.4 cm right thyroid nodule is not  well-defined on the current exam. Prior thyroid ultrasound, 05/27/2020. Similar leftward tracheal deviation. Small mediastinal and hilar lymph nodes are not enlarged by size criteria. There is mild distal esophageal wall thickening. Lungs/Pleura: Mild lingular scarring and subsegmental atelectasis in the lower lobes. No consolidation or evidence of pneumonia. No findings of pulmonary edema. No pleural effusion. The trachea and central bronchi are patent. Upper Abdomen: Prominent liver, partially included. No acute upper abdominal findings. Musculoskeletal: Mild thoracic spondylosis with endplate spurring. There are no acute or  suspicious osseous abnormalities. Review of the MIP images confirms the above findings. IMPRESSION: 1. No pulmonary embolus. 2. Small pericardial effusion, slightly increased in size from prior exam. 3. Mild distal esophageal wall thickening, can be seen with reflux or esophagitis. Electronically Signed   By: Keith Rake M.D.   On: 03/07/2021 23:28   US Venous Img Lower Unilateral Left  Result Date: 03/07/2021 CLINICAL DATA:  Leg swelling. EXAM: BILATERAL LOWER EXTREMITY VENOUS DOPPLER ULTRASOUND TECHNIQUE: Gray-scale sonography with compression, as well as color and duplex ultrasound, were performed to evaluate the deep venous system(s) from the level of the common femoral vein through the popliteal and proximal calf veins. COMPARISON:  None. FINDINGS: VENOUS Normal compressibility of the common femoral, superficial femoral, and popliteal veins, as well as the visualized calf veins. Visualized portions of profunda femoral vein and great saphenous vein unremarkable. No filling defects to suggest DVT on grayscale or color Doppler imaging. Doppler waveforms show normal direction of venous flow, normal respiratory plasticity and response to augmentation. Limited views of the contralateral common femoral vein are unremarkable. OTHER None. Limitations: none IMPRESSION: Negative. Electronically Signed   By: Ronney Asters M.D.   On: 03/07/2021 23:46    Cardiac Studies:  ECG: nonspecific lateral T wave changes in setting of IVCD   Telemetry: NSR 03/09/2021   Echo: pending being done   Medications:    aspirin  324 mg Oral Daily   atorvastatin  40 mg Oral QHS   busPIRone  30 mg Oral BID   carvedilol  12.5 mg Oral Daily   enoxaparin (LOVENOX) injection  40 mg Subcutaneous Q24H   insulin aspart  0-15 Units Subcutaneous TID WC   insulin aspart  0-5 Units Subcutaneous QHS   isosorbide dinitrate  20 mg Oral BID   mirabegron ER  25 mg Oral Daily   pantoprazole  40 mg Oral BID   topiramate  25 mg Oral  QHS      Assessment/Plan:  Hx of atypical chest pain Atypical chest pain - hs troponin x 4 negative - EKG with nonspecific ST-T changes - chest pain sounds pleuritic and MSK - worse with deep inspiration and is reproducible with palpation - last myoview 2020 negative for ischemia - heart cath in 2008, 2016 with normal coronaries - D/c home if echo shows normal EF and no new RWMA;s      Shortness of breath HFpEF - CTA negative for PE- has had 5 CTA;s since 08/14/19 would not keep repeating  - echo pending - BNP normal 47.6  - CXR not consistent with CHF - she has received 40 mg IV lasix x 1 today - suspect a component of deconditioning, but need to rule out pericardial effusion     Hypertension Continue coreg, imdur, and lisinopril     Hyperlipidemia Continue 40 mg lipitor Will need to update lipid profile     DM with hyperglycemia - A1c 8.4% - per primary - may need endocrinology OP  Collier Salina  Kanaan Kagawa 03/09/2021, 9:11 AM

## 2021-03-09 NOTE — Progress Notes (Signed)
  Echocardiogram 2D Echocardiogram has been performed.  [name] 03/09/2021, 9:34 AM

## 2021-03-19 ENCOUNTER — Ambulatory Visit
Admission: EM | Admit: 2021-03-19 | Discharge: 2021-03-19 | Disposition: A | Discharge status: Home / Self Care | Payer: Medicare Other

## 2021-03-19 ENCOUNTER — Emergency Department (HOSPITAL_COMMUNITY)
Admission: EM | Admit: 2021-03-19 | Discharge: 2021-03-19 | Disposition: A | Discharge status: Home / Self Care | Payer: Medicare Other | Attending: Emergency Medicine | Admitting: Emergency Medicine

## 2021-03-19 ENCOUNTER — Other Ambulatory Visit: Payer: Self-pay

## 2021-03-19 ENCOUNTER — Encounter (HOSPITAL_COMMUNITY): Payer: Self-pay | Admitting: Emergency Medicine

## 2021-03-19 DIAGNOSIS — I5030 Unspecified diastolic (congestive) heart failure: Secondary | ICD-10-CM | POA: Insufficient documentation

## 2021-03-19 DIAGNOSIS — Z87891 Personal history of nicotine dependence: Secondary | ICD-10-CM | POA: Insufficient documentation

## 2021-03-19 DIAGNOSIS — Z96652 Presence of left artificial knee joint: Secondary | ICD-10-CM | POA: Insufficient documentation

## 2021-03-19 DIAGNOSIS — J45909 Unspecified asthma, uncomplicated: Secondary | ICD-10-CM | POA: Diagnosis not present

## 2021-03-19 DIAGNOSIS — Z7982 Long term (current) use of aspirin: Secondary | ICD-10-CM | POA: Insufficient documentation

## 2021-03-19 DIAGNOSIS — R1030 Lower abdominal pain, unspecified: Secondary | ICD-10-CM | POA: Diagnosis not present

## 2021-03-19 DIAGNOSIS — Z96642 Presence of left artificial hip joint: Secondary | ICD-10-CM | POA: Insufficient documentation

## 2021-03-19 DIAGNOSIS — Z91040 Latex allergy status: Secondary | ICD-10-CM | POA: Diagnosis not present

## 2021-03-19 DIAGNOSIS — E119 Type 2 diabetes mellitus without complications: Secondary | ICD-10-CM | POA: Diagnosis not present

## 2021-03-19 DIAGNOSIS — Z79899 Other long term (current) drug therapy: Secondary | ICD-10-CM | POA: Insufficient documentation

## 2021-03-19 DIAGNOSIS — T7421XA Adult sexual abuse, confirmed, initial encounter: Secondary | ICD-10-CM | POA: Diagnosis present

## 2021-03-19 DIAGNOSIS — I2510 Atherosclerotic heart disease of native coronary artery without angina pectoris: Secondary | ICD-10-CM | POA: Insufficient documentation

## 2021-03-19 DIAGNOSIS — I110 Hypertensive heart disease with heart failure: Secondary | ICD-10-CM | POA: Insufficient documentation
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
## 2021-03-19 NOTE — SANE Note (Signed)
The SANE/FNE Naval architect) consult has been completed. The provider, Dr Isla Pence, has been notified. Please contact the SANE/FNE nurse on call (listed in [place]) with any further concerns.

## 2021-03-19 NOTE — Discharge Instructions (Signed)
Please report to [name] ED for further evaluation.
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
## 2021-03-22 ENCOUNTER — Telehealth: Payer: Medicare Other | Admitting: Adult Health

## 2021-03-23 IMAGING — DX DG CHEST 1V PORT
1 series · 1 of 1 positions shown · non-contrast
Comparison: 03/05/2019

CLINICAL DATA: Evaluate for chest pain

EXAM:
PORTABLE CHEST 1 VIEW

[chest ap]
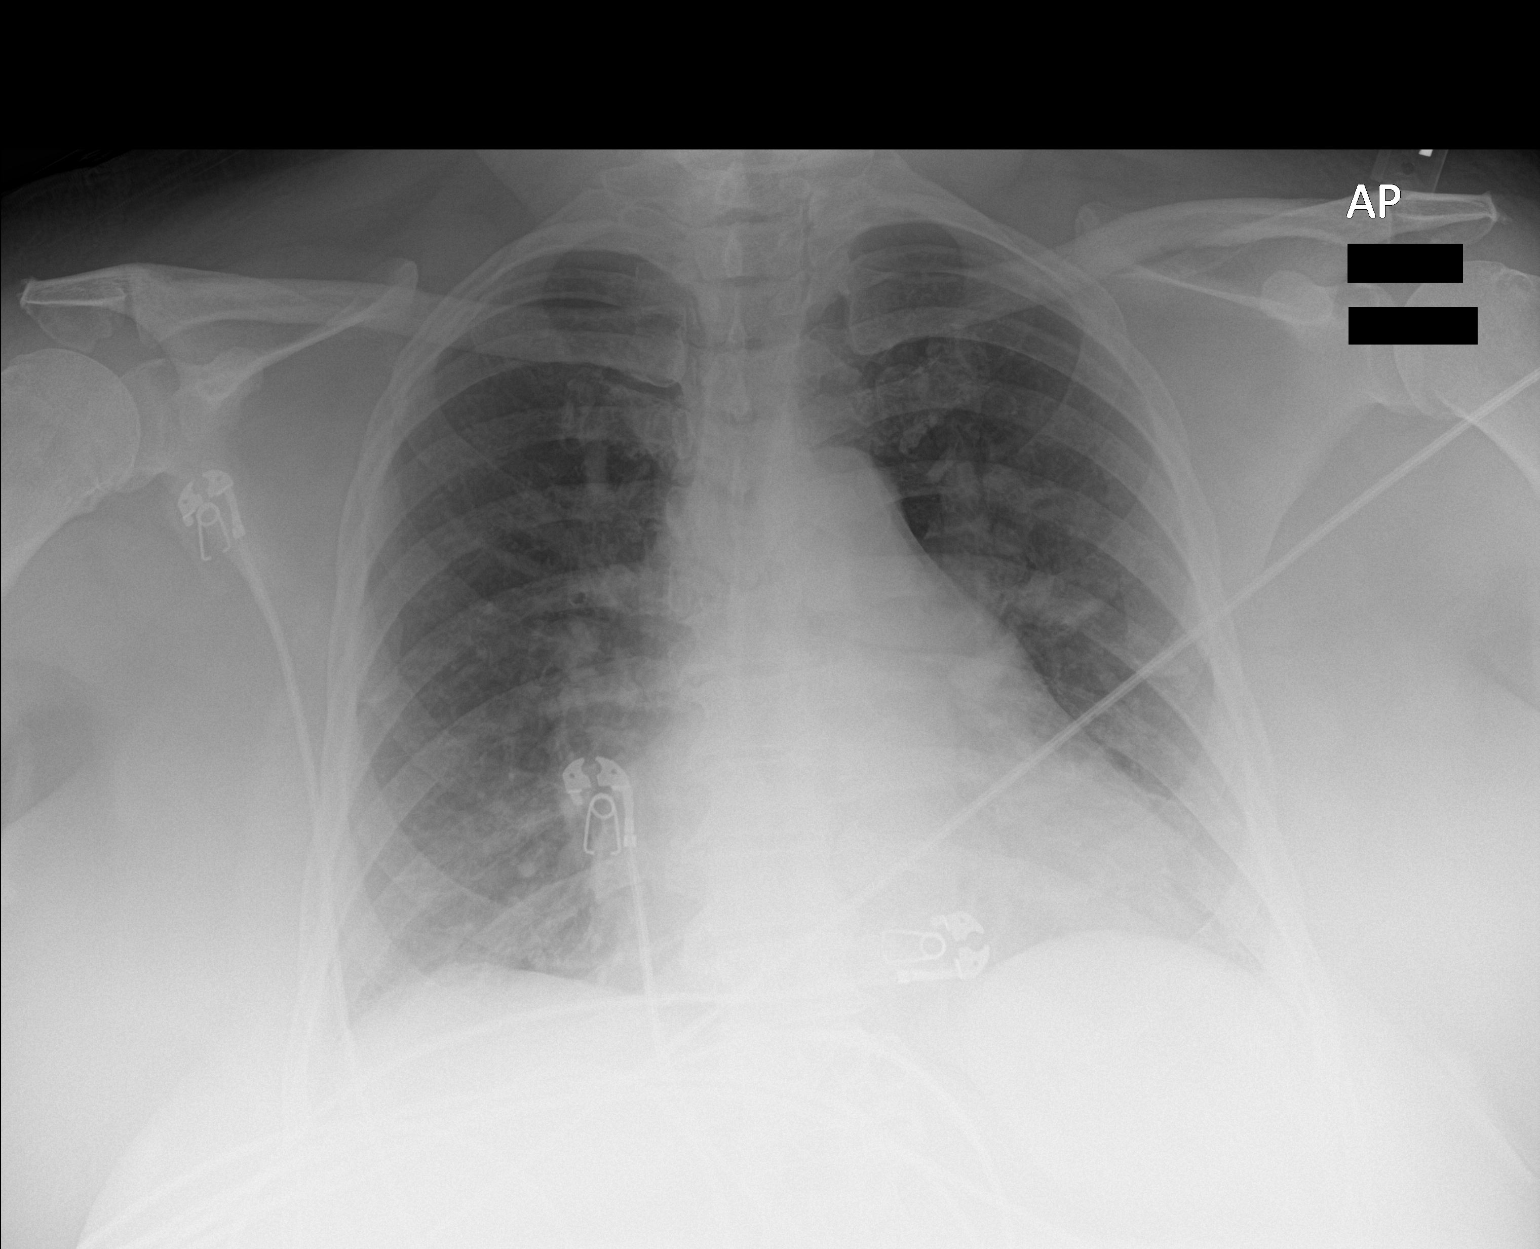

[1 of 1 positions shown; findings below may reference images not displayed]

FINDINGS: Prominent central pulmonary arteries, stable. Overall normal heart
size. There is no edema, consolidation, effusion, or pneumothorax.
IMPRESSION: 1. No acute or interval finding.
2. Question pulmonary hypertension.

## 2021-04-13 ENCOUNTER — Other Ambulatory Visit: Payer: Self-pay | Admitting: Orthopedic Surgery

## 2021-04-13 ENCOUNTER — Other Ambulatory Visit (HOSPITAL_COMMUNITY): Payer: Self-pay | Admitting: Orthopedic Surgery

## 2021-04-13 DIAGNOSIS — Z96642 Presence of left artificial hip joint: Secondary | ICD-10-CM

## 2021-04-20 ENCOUNTER — Encounter: Payer: Self-pay | Admitting: Internal Medicine

## 2021-04-21 ENCOUNTER — Encounter (HOSPITAL_COMMUNITY)
Admission: RE | Admit: 2021-04-21 | Discharge: 2021-04-21 | Disposition: A | Discharge status: Home / Self Care | Payer: Medicare Other | Source: Ambulatory Visit | Attending: Orthopedic Surgery | Admitting: Orthopedic Surgery

## 2021-04-21 ENCOUNTER — Other Ambulatory Visit: Payer: Self-pay

## 2021-04-21 DIAGNOSIS — Z96642 Presence of left artificial hip joint: Secondary | ICD-10-CM | POA: Insufficient documentation

## 2021-04-21 MED ORDER — TECHNETIUM TC 99M MEDRONATE IV KIT
20.5000 | PACK | Freq: Once | INTRAVENOUS | Status: AC | PRN
  Administered 2021-04-21: 11:00:00 20.5 via INTRAVENOUS

## 2021-04-22 ENCOUNTER — Emergency Department (HOSPITAL_BASED_OUTPATIENT_CLINIC_OR_DEPARTMENT_OTHER): Payer: Medicare Other

## 2021-04-22 ENCOUNTER — Other Ambulatory Visit: Payer: Self-pay

## 2021-04-22 ENCOUNTER — Encounter (HOSPITAL_BASED_OUTPATIENT_CLINIC_OR_DEPARTMENT_OTHER): Payer: Self-pay | Admitting: Obstetrics and Gynecology

## 2021-04-22 DIAGNOSIS — Z79899 Other long term (current) drug therapy: Secondary | ICD-10-CM | POA: Diagnosis not present

## 2021-04-22 DIAGNOSIS — Z7982 Long term (current) use of aspirin: Secondary | ICD-10-CM | POA: Diagnosis not present

## 2021-04-22 DIAGNOSIS — E119 Type 2 diabetes mellitus without complications: Secondary | ICD-10-CM | POA: Insufficient documentation

## 2021-04-22 DIAGNOSIS — R072 Precordial pain: Secondary | ICD-10-CM | POA: Insufficient documentation

## 2021-04-22 DIAGNOSIS — Z91040 Latex allergy status: Secondary | ICD-10-CM | POA: Insufficient documentation

## 2021-04-22 DIAGNOSIS — I110 Hypertensive heart disease with heart failure: Secondary | ICD-10-CM | POA: Diagnosis not present

## 2021-04-22 DIAGNOSIS — Z96652 Presence of left artificial knee joint: Secondary | ICD-10-CM | POA: Insufficient documentation

## 2021-04-22 DIAGNOSIS — Z85830 Personal history of malignant neoplasm of bone: Secondary | ICD-10-CM | POA: Diagnosis not present

## 2021-04-22 DIAGNOSIS — R0989 Other specified symptoms and signs involving the circulatory and respiratory systems: Secondary | ICD-10-CM | POA: Diagnosis not present

## 2021-04-22 DIAGNOSIS — I2510 Atherosclerotic heart disease of native coronary artery without angina pectoris: Secondary | ICD-10-CM | POA: Diagnosis not present

## 2021-04-22 DIAGNOSIS — I5030 Unspecified diastolic (congestive) heart failure: Secondary | ICD-10-CM | POA: Diagnosis not present

## 2021-04-22 DIAGNOSIS — J45909 Unspecified asthma, uncomplicated: Secondary | ICD-10-CM | POA: Insufficient documentation

## 2021-04-22 DIAGNOSIS — Z8673 Personal history of transient ischemic attack (TIA), and cerebral infarction without residual deficits: Secondary | ICD-10-CM | POA: Diagnosis not present

## 2021-04-22 DIAGNOSIS — Z87891 Personal history of nicotine dependence: Secondary | ICD-10-CM | POA: Diagnosis not present

## 2021-04-22 LAB — BASIC METABOLIC PANEL
Anion gap: 7 (ref 5–15)
BUN: 17 mg/dL (ref 8–23)
CO2: 30 mmol/L (ref 22–32)
Calcium: 9.3 mg/dL (ref 8.9–10.3)
Chloride: 105 mmol/L (ref 98–111)
Creatinine, Ser: 1.05 mg/dL — ABNORMAL HIGH (ref 0.44–1.00)
GFR, Estimated: >60 mL/min (ref >60)
Glucose, Bld: 146 mg/dL — ABNORMAL HIGH (ref 70–99)
Potassium: 3.3 mmol/L — ABNORMAL LOW (ref 3.5–5.1)
Sodium: 142 mmol/L (ref 135–145)

## 2021-04-22 LAB — CBC
HCT: 38.2 % (ref 36.0–46.0)
Hemoglobin: 12.5 g/dL (ref 12.0–15.0)
MCH: 28.7 pg (ref 26.0–34.0)
MCHC: 32.7 g/dL (ref 30.0–36.0)
MCV: 87.8 fL (ref 80.0–100.0)
Platelets: 210 10*3/uL (ref 150–400)
RBC: 4.35 MIL/uL (ref 3.87–5.11)
RDW: 12.4 % (ref 11.5–15.5)
WBC: 7.4 10*3/uL (ref 4.0–10.5)
nRBC: 0 % (ref 0.0–0.2)

## 2021-04-22 LAB — TROPONIN I (HIGH SENSITIVITY): Troponin I (High Sensitivity): 11 ng/L (ref <18)

## 2021-04-22 NOTE — ED Triage Notes (Signed)
Patient reports to the ER for chest pain, feeling "funny" and pressure in her shoulders. Patient reports a hx of CHF but states she has been taking her fluid pills as prescribed.

## 2021-04-23 ENCOUNTER — Emergency Department (HOSPITAL_BASED_OUTPATIENT_CLINIC_OR_DEPARTMENT_OTHER)
Admission: EM | Admit: 2021-04-23 | Discharge: 2021-04-23 | Disposition: A | Discharge status: Home / Self Care | Payer: Medicare Other | Attending: Emergency Medicine | Admitting: Emergency Medicine

## 2021-04-23 DIAGNOSIS — R072 Precordial pain: Secondary | ICD-10-CM | POA: Diagnosis not present

## 2021-04-23 DIAGNOSIS — R0989 Other specified symptoms and signs involving the circulatory and respiratory systems: Secondary | ICD-10-CM

## 2021-04-23 DIAGNOSIS — R0789 Other chest pain: Secondary | ICD-10-CM

## 2021-04-23 DIAGNOSIS — R079 Chest pain, unspecified: Secondary | ICD-10-CM

## 2021-04-23 LAB — TROPONIN I (HIGH SENSITIVITY): Troponin I (High Sensitivity): 11 ng/L (ref <18)

## 2021-04-23 LAB — BRAIN NATRIURETIC PEPTIDE: B Natriuretic Peptide: 50 pg/mL (ref 0.0–100.0)

## 2021-04-23 LAB — TSH: TSH: 0.506 u[IU]/mL (ref 0.350–4.500)

## 2021-04-23 MED ORDER — FUROSEMIDE 10 MG/ML IJ SOLN
80.0000 mg | Freq: Once | INTRAMUSCULAR | Status: AC
  Administered 2021-04-23: 80 mg via INTRAVENOUS
  Filled 2021-04-23: qty 8

## 2021-04-23 MED ORDER — POTASSIUM CHLORIDE 10 MEQ/100ML IV SOLN
10.0000 meq | Freq: Once | INTRAVENOUS | Status: AC
  Administered 2021-04-23: 10 meq via INTRAVENOUS
  Filled 2021-04-23: qty 100

## 2021-04-23 MED ORDER — POTASSIUM CHLORIDE CRYS ER 20 MEQ PO TBCR
40.0000 meq | EXTENDED_RELEASE_TABLET | Freq: Once | ORAL | Status: AC
  Administered 2021-04-23: 40 meq via ORAL
  Filled 2021-04-23: qty 2

## 2021-04-23 NOTE — ED Notes (Signed)
Pt has voided 1200 cc. Pt does not feel any relief from chest pressure/left arm pain.

## 2021-04-23 NOTE — ED Provider Notes (Signed)
7:20 AM Care assumed from Dr. Dayna Barker.  At time of transfer care, plan of care is to reassess patient after she diuresis some and for necessary potassium repletion.  We will ambulate and make sure she passes p.o. challenge and then plan of care likely be discharged home after reassuring work-up overnight.   After potassium and some diuresis and rest, patient feeling much better.  She was able to pass p.o. challenge over an hour to go home.  She will follow-up with her PCP and understands return precautions.  Patient discharged in good condition.  Clinical Impression: 1. Chest discomfort   2. Chest pain   3. Pulmonary vascular congestion     Disposition: Discharge  Condition: Good  I have discussed the results, Dx and Tx plan with the pt(& family if present). He/she/they expressed understanding and agree(s) with the plan. Discharge instructions discussed at great length. Strict return precautions discussed and pt &/or family have verbalized understanding of the instructions. No further questions at time of discharge.    New Prescriptions   No medications on file    Follow Up: Sonia Side., FNP [place] [address] [facility] [place] [address] [phone]       Blondina Coderre, Gwenyth Allegra, MD 04/23/21 [phone]

## 2021-04-23 NOTE — ED Provider Notes (Signed)
[facility] Provider Note   CSN: [id number] Arrival date & time: 04/22/21  2021     History Chief Complaint  Patient presents with   Chest Pain    [name] is a 60 y.o. female.  The history is provided by the patient.  Chest Pain Pain location:  Substernal area, L chest and R chest Pain quality: aching   Pain radiates to:  Does not radiate Pain severity:  Mild Onset quality:  Gradual Timing:  Intermittent Context: breathing   Ineffective treatments:  None tried     Past Medical History:  Diagnosis Date   Anginal pain ([place])    a. NL cath in 2008;  b. Myoview 03/2011: dec uptake along mid anterior wall on stress imaging -> ? attenuation vs. ischemia, EF 65%;  c. Echo 04/2011: EF 55-60%, no RWMA, Gr 2 dd   Anxiety    Arthritis    Asthma    Back pain    Bone cancer (HCC)    Cancer (HCC)    Chest pain    CHF (congestive heart failure) (HCC)    Depression    Diabetes mellitus    Drug use    Dyspnea    Frequent urination    GERD (gastroesophageal reflux disease)    Glaucoma    HLD (hyperlipidemia)    Hypertension    IBS (irritable bowel syndrome)    Joint pain    Lactose intolerance    Leg edema    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 benign thymoma   Obesity    Palpitations    Pneumonia 05/2016   double   Pulmonary edema    Pulmonary embolism ([place])    a. 2008 -> coumadin x 6 mos.   Rheumatoid arthritis ([place])    Sleep apnea    on CPAP 02/2018   TIA (transient ischemic attack)    Urinary urgency     Patient Active Problem List   Diagnosis Date Noted   At risk for impaired metabolic function 60/01/9322   Unilateral primary osteoarthritis, left hip    Hip arthritis 06/11/2018   Class 3 severe obesity with serious comorbidity and body mass index (BMI) of 40.0 to 44.9 in adult [facility]) 05/29/2018   Other fatigue 11/20/2017   Shortness of breath on exertion 11/20/2017   Diabetes mellitus ([place]) 11/20/2017   Vitamin  D deficiency 11/20/2017   Depression 11/20/2017   Other hyperlipidemia 11/20/2017   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/04/2016   Presence of left artificial knee joint 09/20/2016   Arthritis of knee 08/22/2016   Cervical radiculopathy 07/26/2016   Cervical disc disorder with radiculopathy 07/26/2016   Right arm pain 06/29/2016   Cervicalgia 06/29/2016   Primary osteoarthritis of left knee 06/29/2016   Hypersomnia with sleep apnea 11/18/2015   Lethargy 11/18/2015   Exertional chest pain 09/30/2014   Abnormal cardiac function test 09/29/2014   Chest pain with moderate risk for cardiac etiology 09/28/2014   HTN (hypertension) 09/28/2014   Hypokalemia    Paresthesia 06/18/2014   Numbness and tingling of left arm and leg    Unstable angina ([place]) 05/24/2014   OSA on CPAP 05/24/2014   CAD (coronary artery disease) 11/25/2012   S/P laparoscopic appendectomy 55/73/2202   Diastolic CHF ([place]) 54/27/0623   Mediastinal mass 01/09/2012   SOB (shortness of breath) 06/20/2011   Mediastinal abnormality 06/20/2011   Dyslipidemia 12/23/2009   GLAUCOMA 12/23/2009   ARTHRITIS 12/23/2009   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   Class 2 severe obesity with serious comorbidity and body mass index (BMI) of 38.0 to 38.9 in adult [facility]) 09/13/2006   ANXIETY STATE NOS 09/13/2006   DISORDER, DEPRESSIVE NEC 09/13/2006   CARPAL TUNNEL SYNDROME, MILD 09/13/2006   Unspecified essential hypertension 09/13/2006   IBS 09/13/2006   DEGENERATION, LUMBAR/LUMBOSACRAL DISC 09/13/2006   SYMPTOM, SWELLING/MASS/LUMP IN CHEST 09/13/2006   PULMONARY EMBOLISM, HX OF 09/13/2006    Past Surgical History:  Procedure Laterality Date   ABDOMINAL HYSTERECTOMY  2005   APPENDECTOMY     CARDIAC CATHETERIZATION     Normal   CARDIAC CATHETERIZATION N/A 09/30/2014   Procedure: Left Heart Cath and Coronary Angiography;  Surgeon: Sherren Mocha, MD;  Location: [facility];  Service: Cardiovascular;  Laterality: N/A;   LAPAROSCOPIC  APPENDECTOMY N/A 06/03/2012   Procedure: APPENDECTOMY LAPAROSCOPIC;  Surgeon: Stark Klein, MD;  Location: [facility];  Service: General;  Laterality: N/A;   Left knee surgery  2008   LEG SURGERY     TONSILLECTOMY     TOTAL HIP ARTHROPLASTY Left 06/11/2018   Procedure: LEFT TOTAL HIP ARTHROPLASTY ANTERIOR APPROACH;  Surgeon: Meredith Pel, MD;  Location: [facility];  Service: Orthopedics;  Laterality: Left;   TOTAL KNEE ARTHROPLASTY Left 08/22/2016   Procedure: TOTAL KNEE ARTHROPLASTY;  Surgeon: Meredith Pel, MD;  Location: [facility];  Service: Orthopedics;  Laterality: Left;   TUBAL LIGATION  1989     OB History     Gravida  4   Para  3   Term  3   Preterm  0   AB  1   Living  3      SAB  1   IAB  0   Ectopic  0   Multiple  0   Live Births  3           Family History  Problem Relation Age of Onset   Emphysema Mother    Arthritis Mother    Heart failure Mother        alive @ 19   Stroke Mother    Diabetes Mother    Hypertension Mother    Hyperlipidemia Mother    Depression Mother    Anxiety disorder Mother    Asthma Brother    Heart disease Father        died @ 51's.   Stroke Father    Diabetes Father    Hyperlipidemia Father    Hypertension Father    Bipolar disorder Father    Sleep apnea Father    Alcoholism Father    Drug abuse Father    Diabetes Sister    Heart disease Paternal Grandfather    Colon cancer Maternal Grandfather    Breast cancer Maternal Aunt     Social History   Tobacco Use   Smoking status: Former    Packs/day: 0.50    Years: 15.00    Pack years: 7.50    Types: Cigarettes    Quit date: 04/24/1985    Years since quitting: 36.0   Smokeless tobacco: Never  Vaping Use   Vaping Use: Never used  Substance Use Topics   Alcohol use: No    Alcohol/week: 0.0 standard drinks   Drug use: Not Currently    Types: Marijuana    Home Medications Prior to Admission medications   Medication Sig Start Date End Date Taking?  Authorizing Provider  albuterol (PROVENTIL HFA;VENTOLIN HFA) 108 (90 Base) MCG/ACT inhaler Inhale 2 puffs into  the lungs every 6 (six) hours as needed for wheezing or shortness of breath.    [provider]  aspirin 81 MG chewable tablet Chew 81 mg by mouth daily.    [provider]  atorvastatin (LIPITOR) 40 MG tablet Take 40 mg by mouth at bedtime.     [provider]  busPIRone (BUSPAR) 30 MG tablet Take 30 mg by mouth 2 (two) times daily. 12/18/19   [provider]  carvedilol (COREG) 12.5 MG tablet Take 12.5 mg by mouth daily. 08/11/19   [provider]  diclofenac Sodium (VOLTAREN) 1 % GEL Apply 2 g topically 4 (four) times daily as needed (joint/muscle pain). 09/20/19   [provider]  furosemide (LASIX) 40 MG tablet Take 60 mg by mouth daily.    Rai, Vernelle Emerald, MD  hydrOXYzine (VISTARIL) 25 MG capsule Take 25 mg by mouth 3 (three) times daily as needed for itching.    [provider]  isosorbide dinitrate (ISORDIL) 20 MG tablet Take 20 mg by mouth 2 (two) times daily.    [provider]  JENTADUETO XR 2.08-998 MG TB24 Take 1 tablet by mouth daily. 12/13/20   [provider]  lidocaine (LIDODERM) 5 % Place 1 patch onto the skin daily as needed for pain. 02/11/21   [provider]  lisinopril (PRINIVIL,ZESTRIL) 20 MG tablet Take 20 mg by mouth daily.  09/21/14   [provider]  loperamide (IMODIUM A-D) 2 MG tablet Take 4 mg by mouth 4 (four) times daily as needed for diarrhea or loose stools.     [provider]  MYRBETRIQ 25 MG TB24 tablet Take 1 tablet (25 mg total) by mouth daily. 03/13/19   Dennard Nip D, MD  nystatin (MYCOSTATIN/NYSTOP) powder Apply 1 application topically 3 (three) times daily as needed for rash.    [provider]  ondansetron (ZOFRAN) 4 MG tablet Take 1 tablet (4 mg total) by mouth every 6 (six) hours. Patient taking differently: Take 4 mg by mouth  every 6 (six) hours as needed for nausea or vomiting. 09/03/20   Blanchie Dessert, MD  pantoprazole (PROTONIX) 40 MG tablet Take 1 tablet (40 mg total) by mouth 2 (two) times daily. 03/09/21   Domenic Polite, MD  potassium chloride (KLOR-CON M10) 10 MEQ tablet Take 1 tablet (10 mEq total) by mouth 2 (two) times daily. 05/27/18   Jearld Lesch A, DO  Semaglutide,0.25 or 0.5MG /DOS, (OZEMPIC, 0.25 OR 0.5 MG/DOSE,) 2 MG/1.5ML SOPN Inject 0.5 mg into the skin once a week. DX:E11.69 Patient not taking: No sig reported 04/12/20   Mellody Dance, DO  topiramate (TOPAMAX) 25 MG tablet Take 1 tablet (25 mg total) by mouth at bedtime. 04/12/20   Mellody Dance, DO    Allergies    Penicillins, Hydrocodone-acetaminophen, Latex, and Sulfa antibiotics  Review of Systems   Review of Systems  Cardiovascular:  Positive for chest pain.  All other systems reviewed and are negative.  Physical Exam Updated Vital Signs BP (!) 159/86    Pulse 73    Temp 97.7 F (36.5 C) (Temporal)    Resp 18    Ht 4\' 11"  (1.499 m)    Wt 96.2 kg    SpO2 95%    BMI 42.82 kg/m   Physical Exam Vitals and nursing note reviewed.  Constitutional:      Appearance: She is well-developed.  HENT:     Head: Normocephalic and atraumatic.  Cardiovascular:     Rate and Rhythm:  Normal rate and regular rhythm.  Pulmonary:     Effort: No respiratory distress.     Breath sounds: No stridor. No decreased breath sounds or wheezing.  Abdominal:     General: There is no distension.  Musculoskeletal:     Cervical back: Normal range of motion.     Right lower leg: No edema.     Left lower leg: No edema.  Skin:    General: Skin is warm and dry.  Neurological:     General: No focal deficit present.     Mental Status: She is alert.    ED Results / Procedures / Treatments   Labs (all labs ordered are listed, but only abnormal results are displayed) Labs Reviewed  BASIC METABOLIC PANEL - Abnormal; Notable for the following components:       Result Value   Potassium 3.3 (*)    Glucose, Bld 146 (*)    Creatinine, Ser 1.05 (*)    All other components within normal limits  CBC  BRAIN NATRIURETIC PEPTIDE  TSH  TROPONIN I (HIGH SENSITIVITY)  TROPONIN I (HIGH SENSITIVITY)    EKG None  Radiology NM Bone Scan 3 Phase  Result Date: 04/22/2021 CLINICAL DATA:  Intermittent left hip pain recently becoming more constant. History of left hip arthroplasty in 2020. No recent injuries. EXAM: NUCLEAR MEDICINE 3-PHASE BONE SCAN TECHNIQUE: Radionuclide angiographic images, immediate static blood pool images, and 3-hour delayed static images were obtained of the hips after intravenous injection of radiopharmaceutical. RADIOPHARMACEUTICALS:  20.5 mCi Tc-62m MDP IV COMPARISON:  Pelvic and left hip radiographs 01/13/2021. Left hip CT 01/16/2019. FINDINGS: Vascular phase: Normal symmetric perfusion bilaterally. Blood pool phase: Normal blood pool phase images. Delayed phase: There is no abnormally increased or decreased uptake around the left total hip arthroplasty or either hip joint. Mild delayed phase activity associated with the sacroiliac joints corresponding with degenerative changes and osteitis condensans ilii on prior radiographs. IMPRESSION: 1. Normal three-phase bone scan of the hips status post left total hip arthroplasty. No evidence of hardware loosening or infection. 2. Mild delayed phase degenerative uptake at the sacroiliac joints. Electronically Signed   By: Richardean Sale M.D.   On: 04/22/2021 13:53   DG Chest Port 1 View  Result Date: 04/23/2021 CLINICAL DATA:  Chest pain. EXAM: PORTABLE CHEST 1 VIEW COMPARISON:  March 07, 2021, October 14, 2020 FINDINGS: There is no evidence of acute infiltrate, pleural effusion or pneumothorax. Mild, stable perihilar prominence of the pulmonary vasculature is noted. The heart size and mediastinal contours are within normal limits. The visualized skeletal structures are unremarkable.  IMPRESSION: Findings which may represent mild pulmonary vascular congestion, stable in appearance when compared to the prior studies. Electronically Signed   By: Virgina Norfolk M.D.   On: 04/23/2021 00:07    Procedures Procedures   Medications Ordered in ED Medications  furosemide (LASIX) injection 80 mg (has no administration in time range)  potassium chloride 10 mEq in 100 mL IVPB (has no administration in time range)  potassium chloride SA (KLOR-CON M) CR tablet 40 mEq (has no administration in time range)    ED Course  I have reviewed the triage vital signs and the nursing notes.  Pertinent labs & imaging results that were available during my care of the patient were reviewed by me and considered in my medical decision making (see chart for details).  Clinical Course as of 04/23/21 4801  Sat Apr 23, 2021  0631 Troponin I (High Sensitivity): 11 [JM]  48 B Natriuretic Peptide: 50.0 [JM]  0631 BP(!): 178/84 [JM]  0631 BP(!): 153/56 [JM]  0631 BP(!): 162/81 [JM]  0631 Potassium(!): 3.3 [JM]  0631 Glucose(!): 146 [JM]  0631 Creatinine(!): 1.05 [JM]  0631 WBC: 7.4 [JM]  0631 DG Chest Port 1 View Cxr possibly with mild congestion. Similar to previous [JM]  0632 ED EKG [JM]    Clinical Course User Index [JM] Poet Hineman, Corene Cornea, MD   MDM Rules/Calculators/A&P                         Overall patient's work-up is reassuring.  She is little hypertensive and has some vascular congestion on x-ray so we will diurese her and have her ambulate with pulse ox for stability.  Care transferred pending same.   Final Clinical Impression(s) / ED Diagnoses Final diagnoses:  Chest pain    Rx / DC Orders ED Discharge Orders     None        Macedonio Scallon, Corene Cornea, MD 04/24/21 [id number]

## 2021-04-23 NOTE — Discharge Instructions (Signed)
Your history, exam and work-up today were suggestive of some symptoms related to some vascular congestion in your lungs.  We gave you some potassium and some IV medicine to help you diurese, please continue your home diuretics at home and follow-up with your primary doctor.  Otherwise your work-up was reassuring and based on your improvement in symptoms and stability we feel you are safe for discharge home.  If any symptoms change or worsen acutely, please return to the nearest emergency department.

## 2021-04-23 NOTE — ED Notes (Signed)
Patient updated on plan of care

## 2021-04-25 IMAGING — CR DG HIP (WITH OR WITHOUT PELVIS) 3-4V BILAT
5 series · 5 of 5 positions shown · non-contrast
Comparison: 12/04/2018

CLINICAL DATA: Fell, hip pain

EXAM:
DG HIP (WITH OR WITHOUT PELVIS) 3-4V BILAT

[pelvis ap]
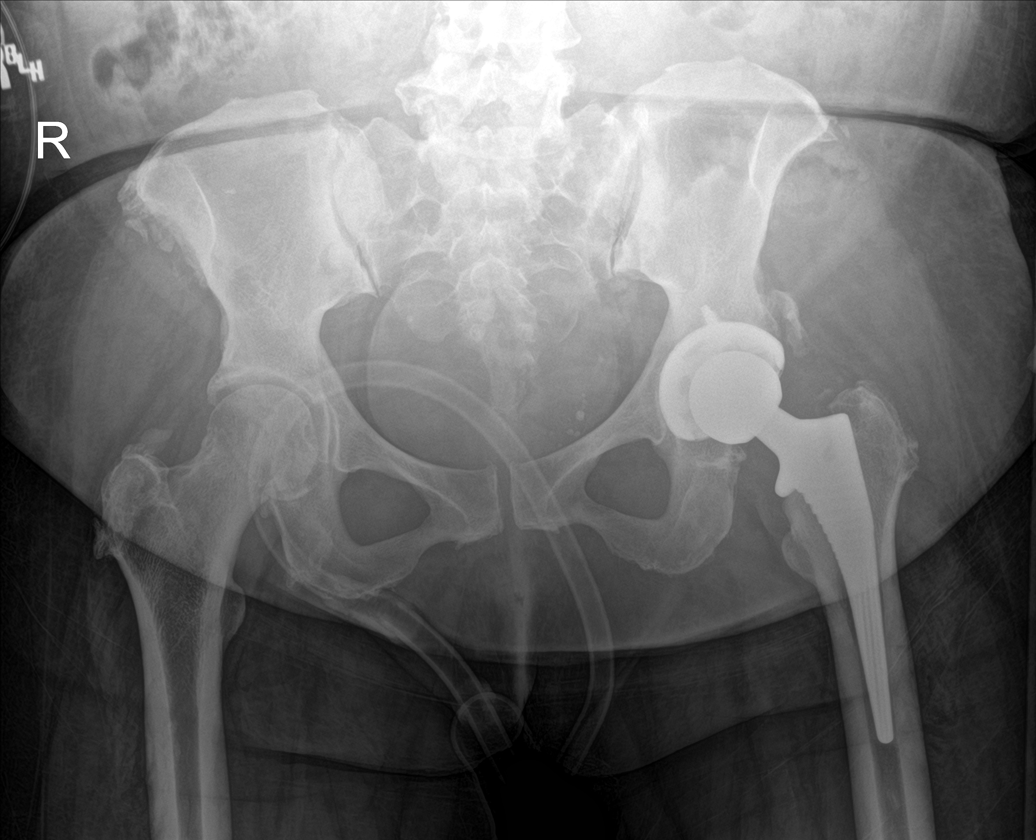

[hip ap (1 of 2)]
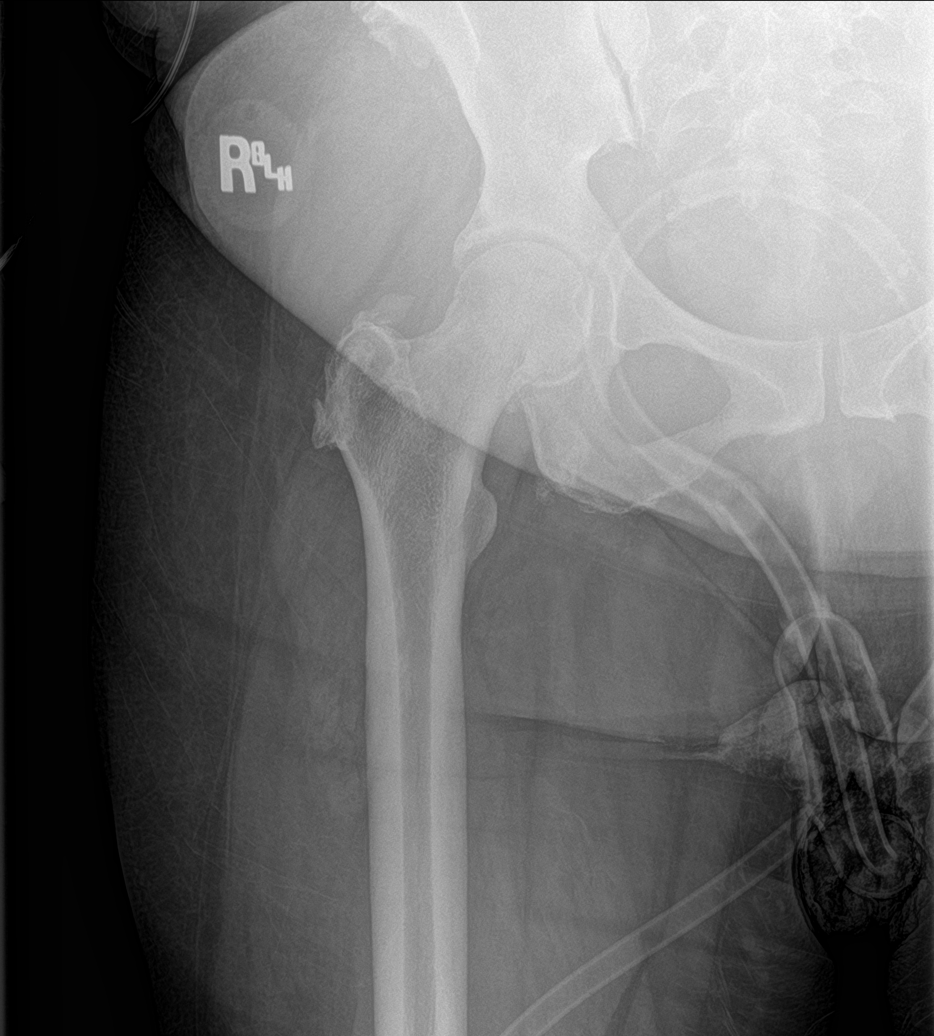

[hip lat (1 of 2)]
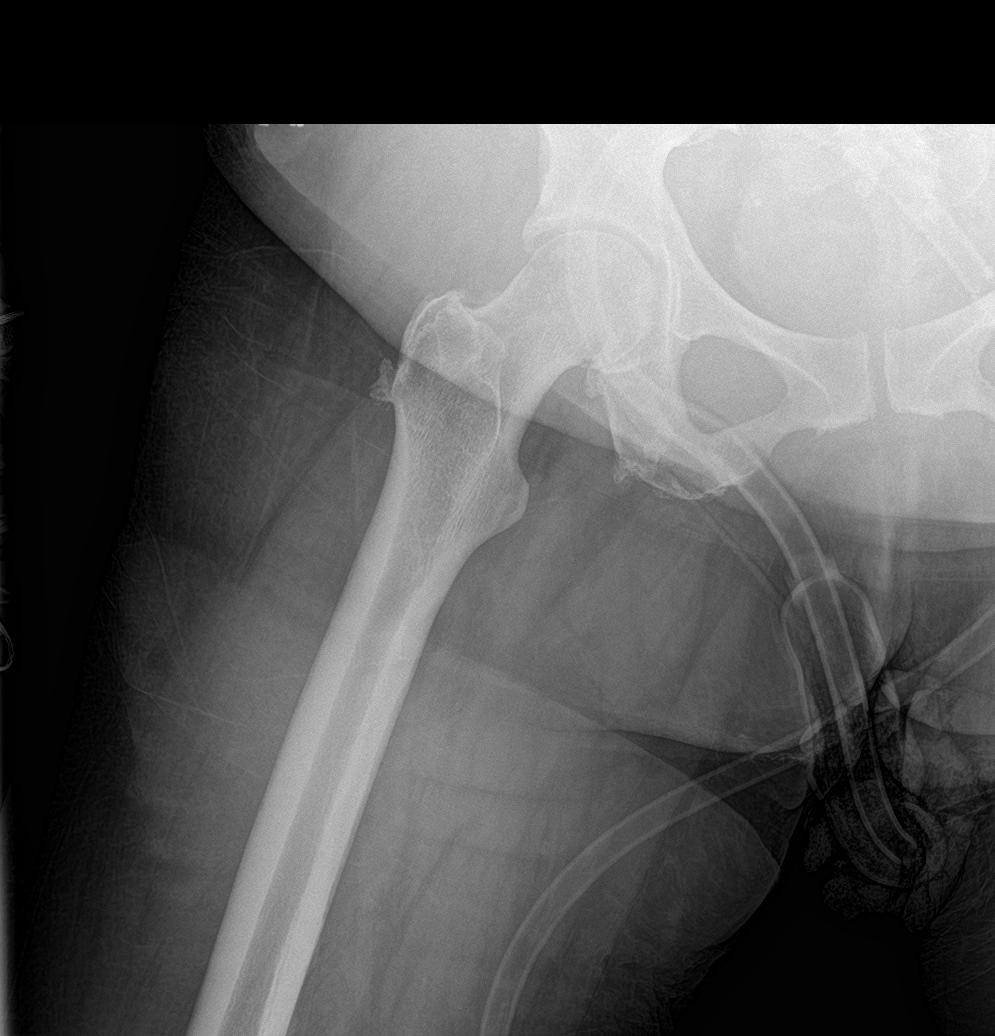

[hip ap (2 of 2)]
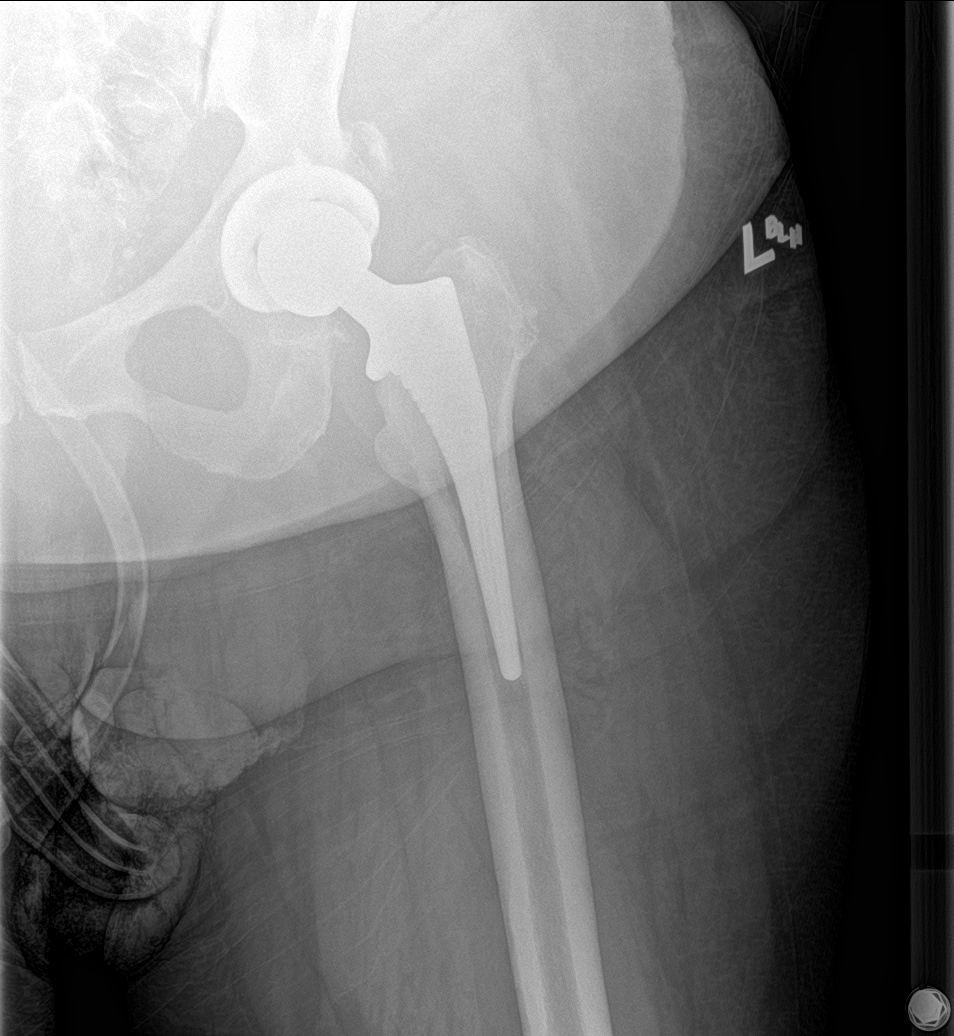

[hip lat (2 of 2)]
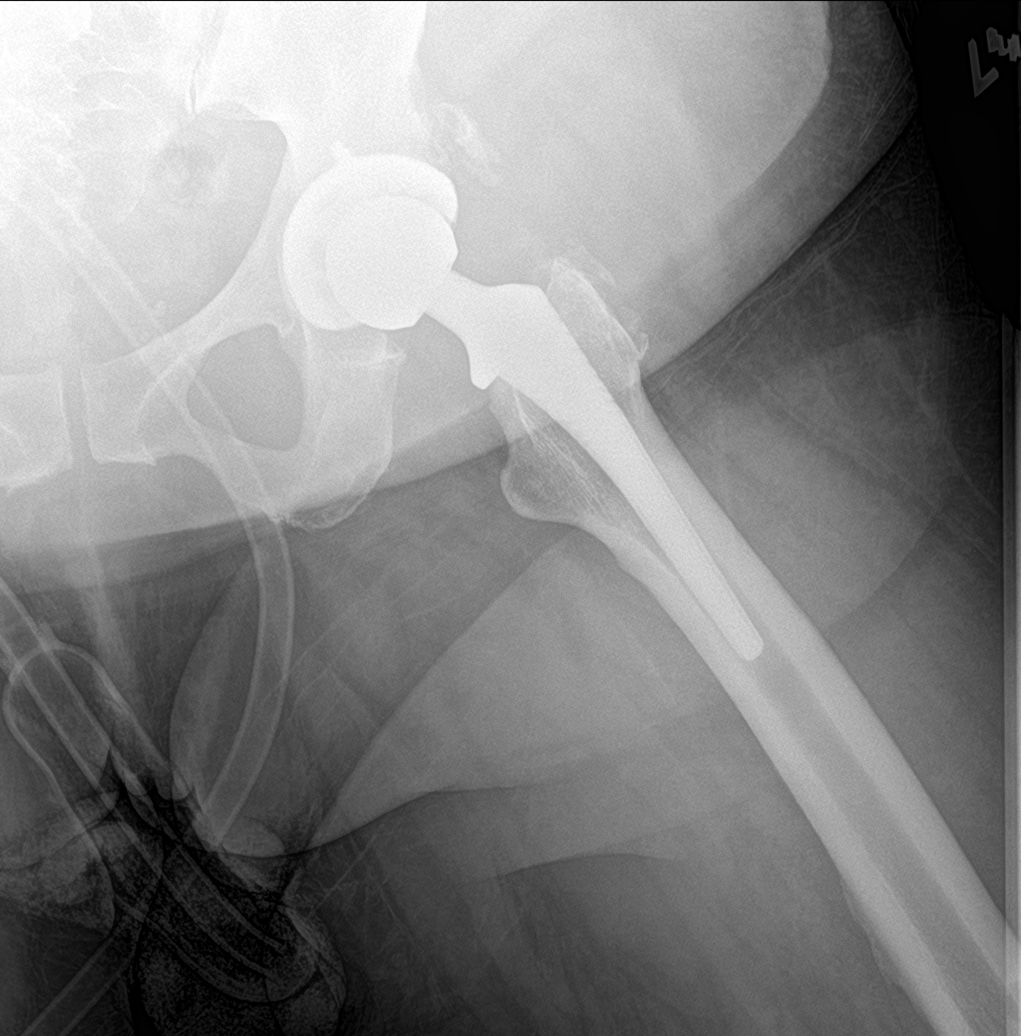

[5 of 5 positions shown; findings below may reference images not displayed]

FINDINGS: Frontal view of the pelvis as well as frontal and frogleg lateral
views of both hips are obtained.

There is stable mild to moderate right hip osteoarthritis with joint
space narrowing and osteophyte formation. Enthesopathic changes are
seen at the greater trochanter.

There is a stable left hip arthroplasty without evidence of
complication. Heterotopic ossification is again noted.

There are no acute displaced fractures. Asymmetric sclerosis of the
sacroiliac joints, left greater than right, stable.
IMPRESSION: 1. No acute bony abnormality.
2. Stable left hip arthroplasty.
3. Stable right hip osteoarthritis.
4. Stable asymmetric sacroiliitis.

## 2021-04-25 IMAGING — CR DG CHEST 2V
2 series · 2 of 2 positions shown · non-contrast
Comparison: 05/19/2019

CLINICAL DATA: Shortness of breath, weakness

EXAM:
CHEST - 2 VIEW

[chest pa]
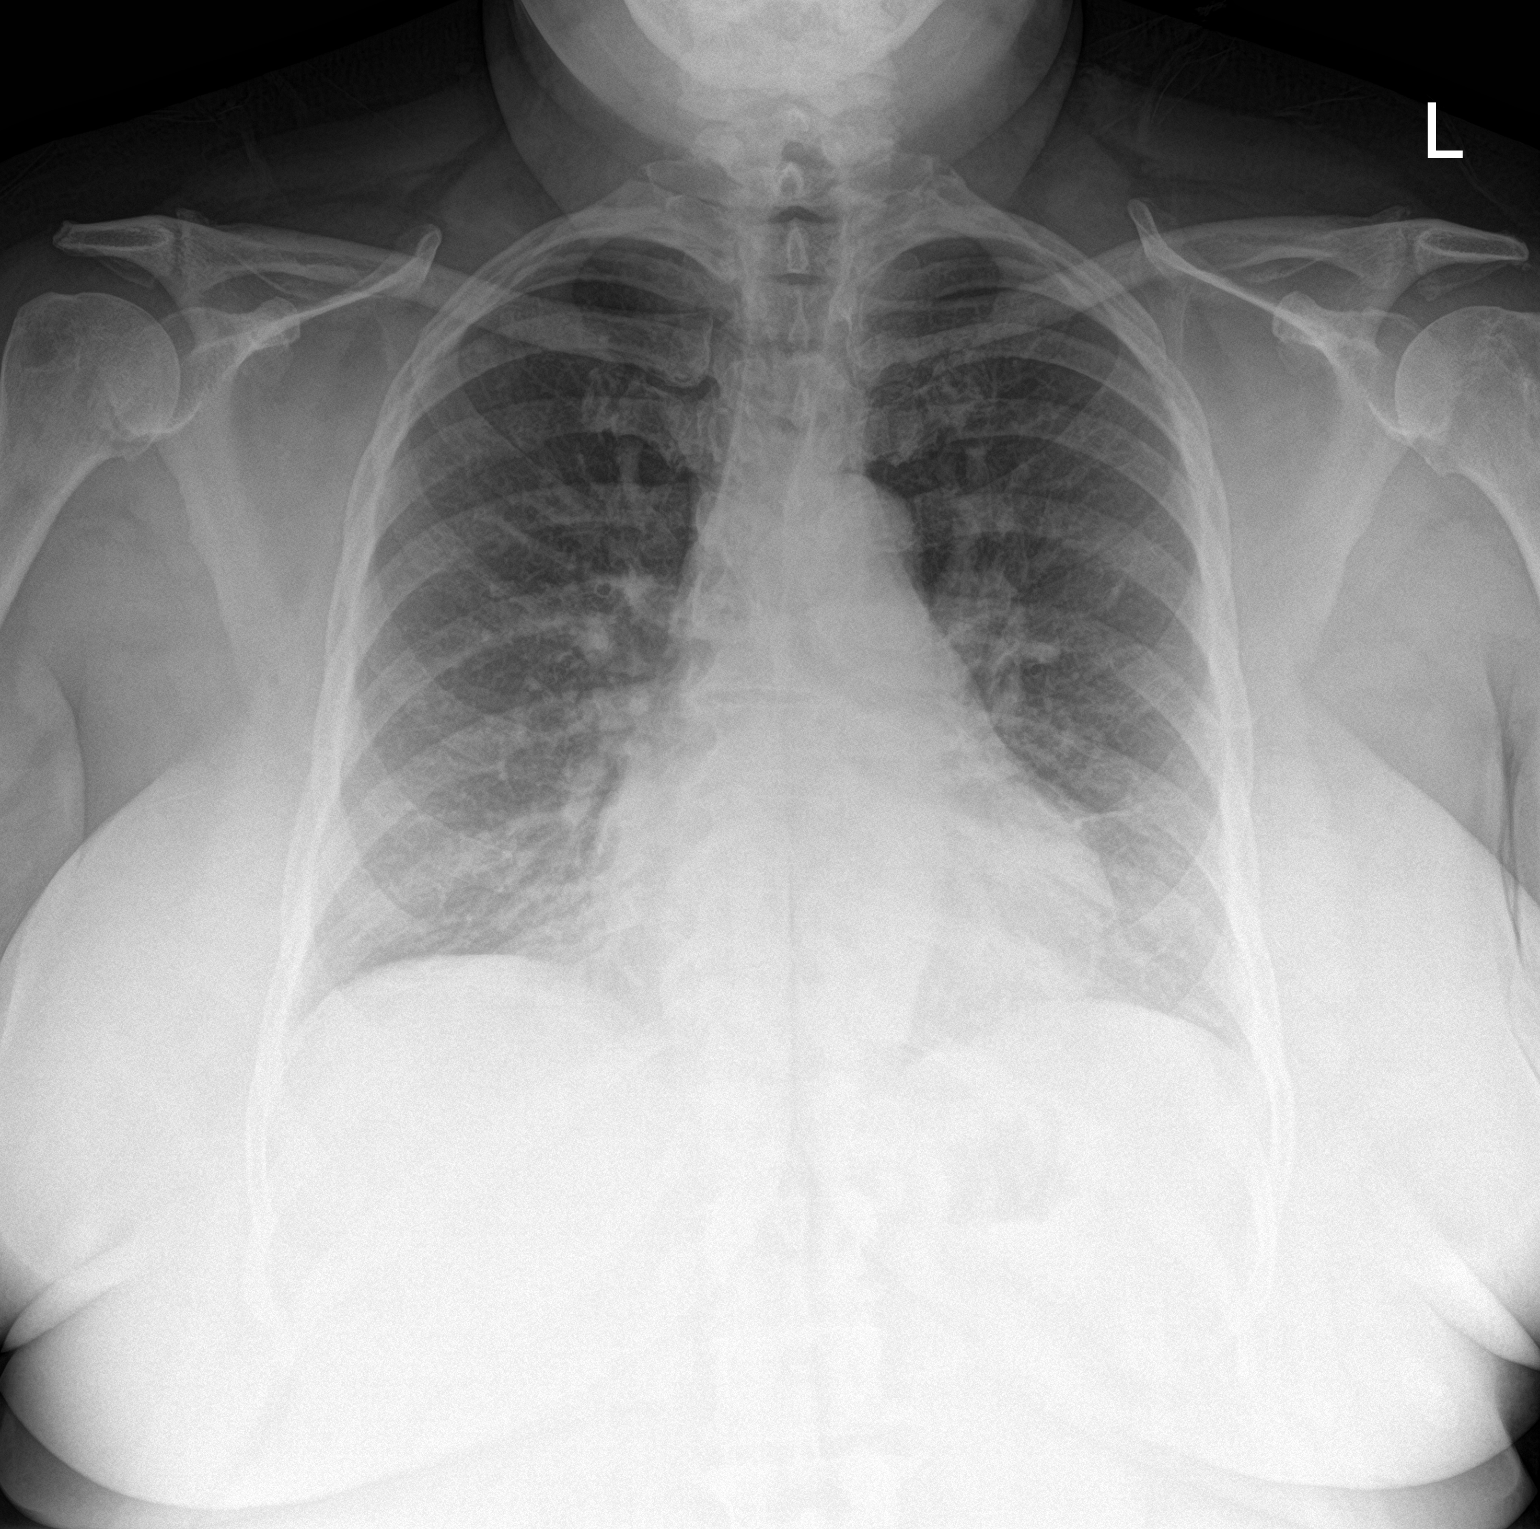

[chest lat]
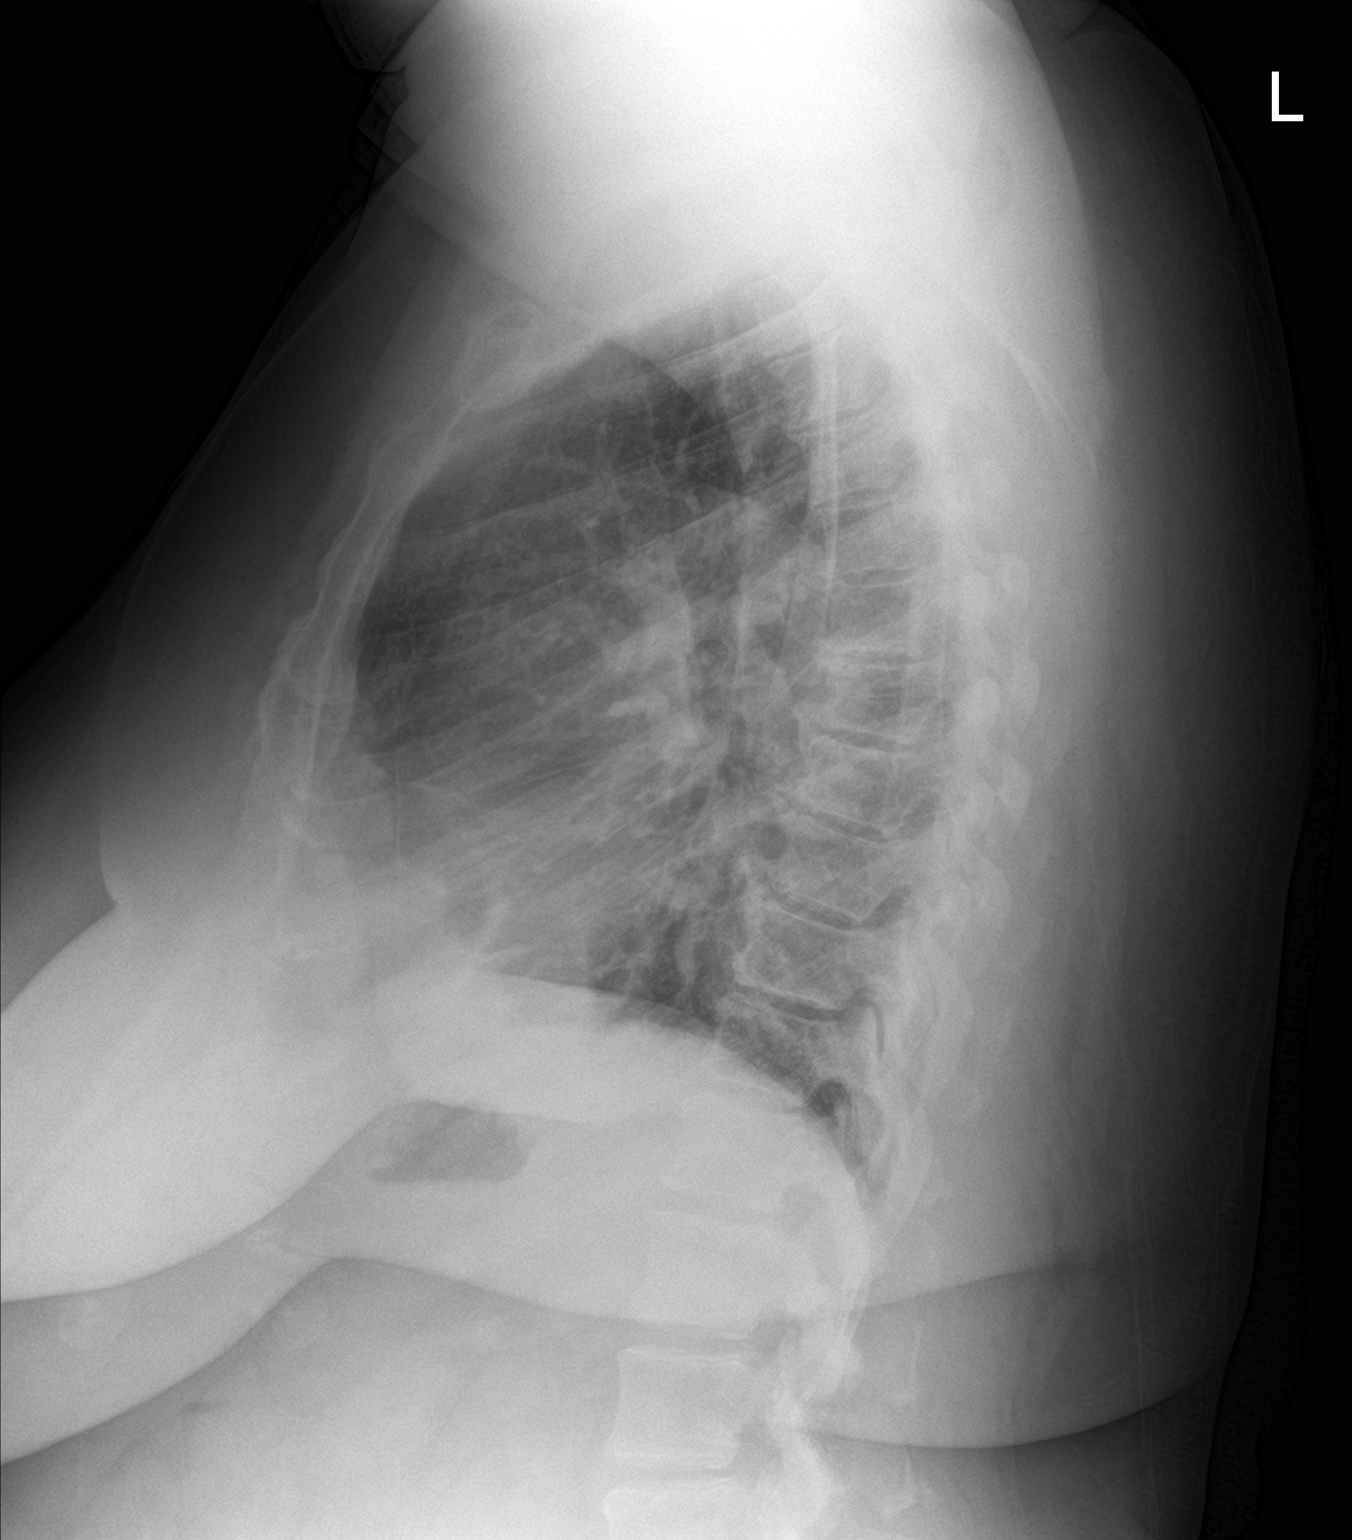

[2 of 2 positions shown; findings below may reference images not displayed]

FINDINGS: Cardiomegaly. Pulmonary vascular prominence and mild, diffuse
interstitial opacity. Disc degenerative disease of the thoracic
spine.
IMPRESSION: Cardiomegaly with pulmonary vascular prominence and mild, diffuse
interstitial pulmonary opacity, likely mild edema. No focal airspace
opacity.

## 2021-04-29 ENCOUNTER — Ambulatory Visit
Admission: RE | Admit: 2021-04-29 | Discharge: 2021-04-29 | Disposition: A | Discharge status: Home / Self Care | Payer: Medicare Other | Source: Ambulatory Visit | Attending: Family | Admitting: Family

## 2021-04-29 ENCOUNTER — Other Ambulatory Visit: Payer: Self-pay | Admitting: Family Medicine

## 2021-04-29 ENCOUNTER — Ambulatory Visit
Admission: RE | Admit: 2021-04-29 | Discharge: 2021-04-29 | Disposition: A | Discharge status: Home / Self Care | Payer: Medicare Other | Source: Ambulatory Visit | Attending: Family Medicine | Admitting: Family Medicine

## 2021-04-29 ENCOUNTER — Other Ambulatory Visit: Payer: Self-pay | Admitting: Family

## 2021-04-29 DIAGNOSIS — R0989 Other specified symptoms and signs involving the circulatory and respiratory systems: Secondary | ICD-10-CM

## 2021-04-29 DIAGNOSIS — M25542 Pain in joints of left hand: Secondary | ICD-10-CM

## 2021-04-29 DIAGNOSIS — M1990 Unspecified osteoarthritis, unspecified site: Secondary | ICD-10-CM

## 2021-04-29 DIAGNOSIS — M25541 Pain in joints of right hand: Secondary | ICD-10-CM

## 2021-05-01 IMAGING — MG DIGITAL SCREENING BILAT W/ TOMO W/ CAD
6 of 12 series · 6 of 36 positions shown · non-contrast
Comparison: Previous exam(s).

ACR Breast Density Category a: The breast tissue is almost entirely
fatty.

CLINICAL DATA: Screening.

EXAM:
DIGITAL SCREENING BILATERAL MAMMOGRAM WITH TOMO AND CAD

[L MLO synth-2D (1 of 2)]
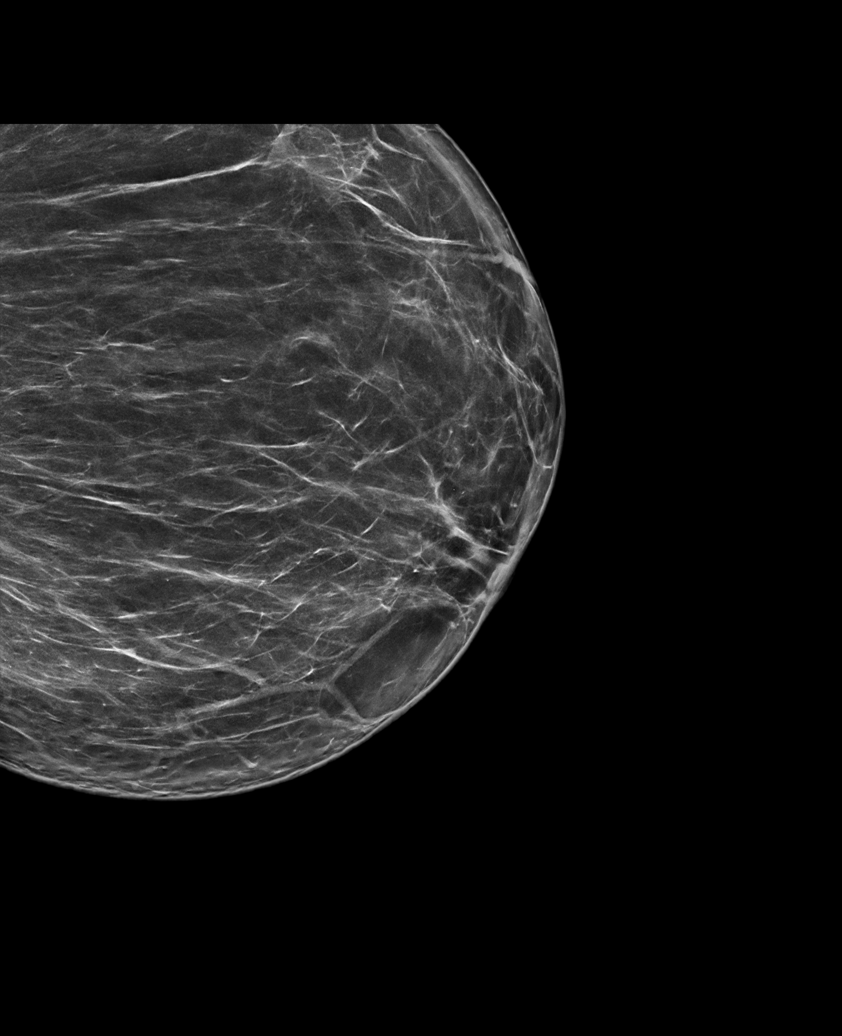

[L CC synth-2D]
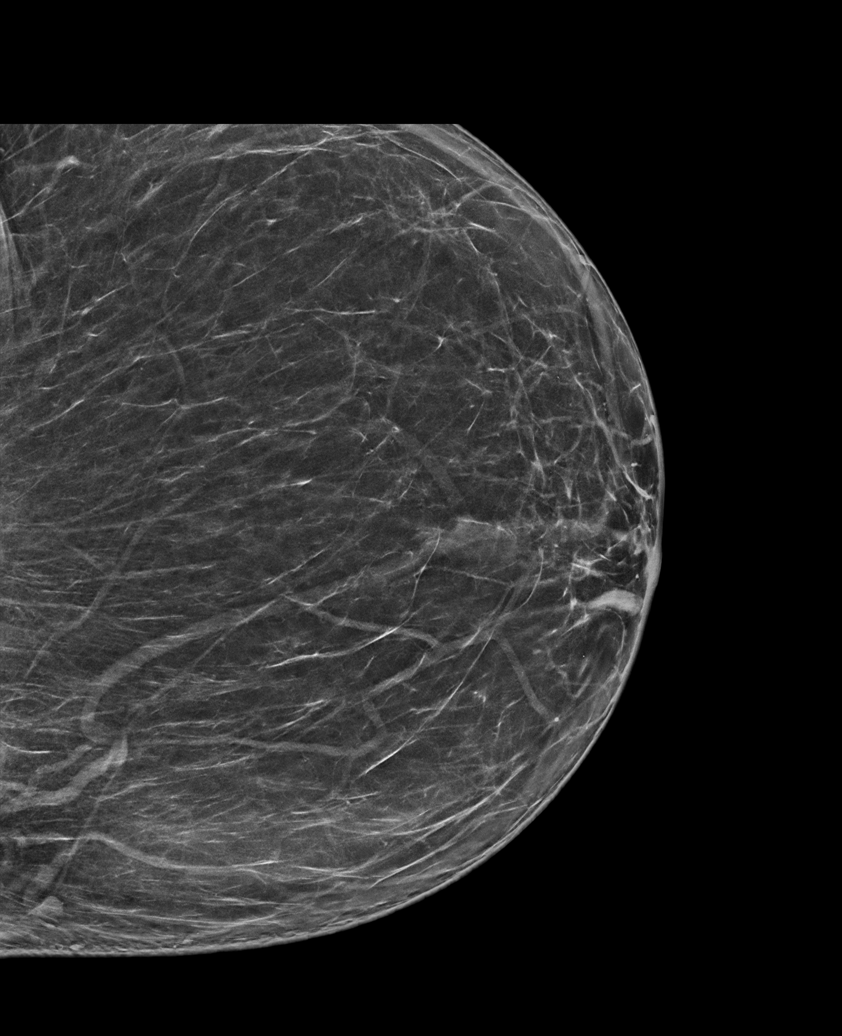

[R MLO synth-2D (1 of 2)]
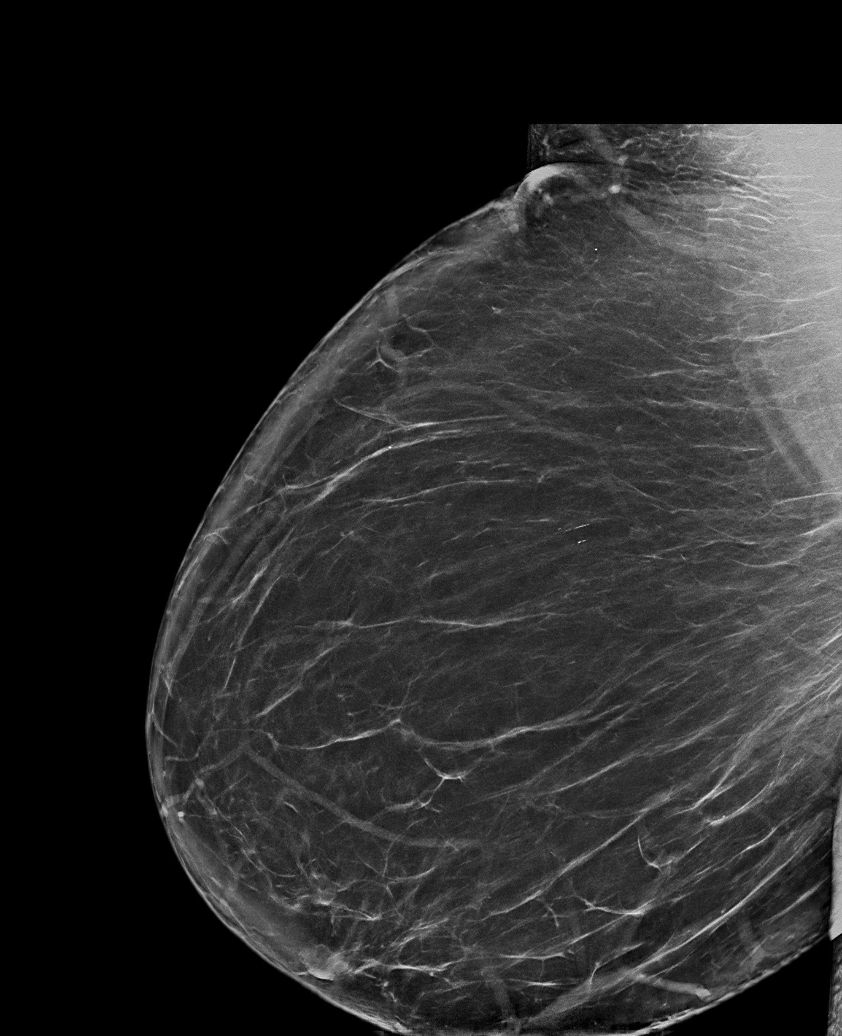

[R CC synth-2D]
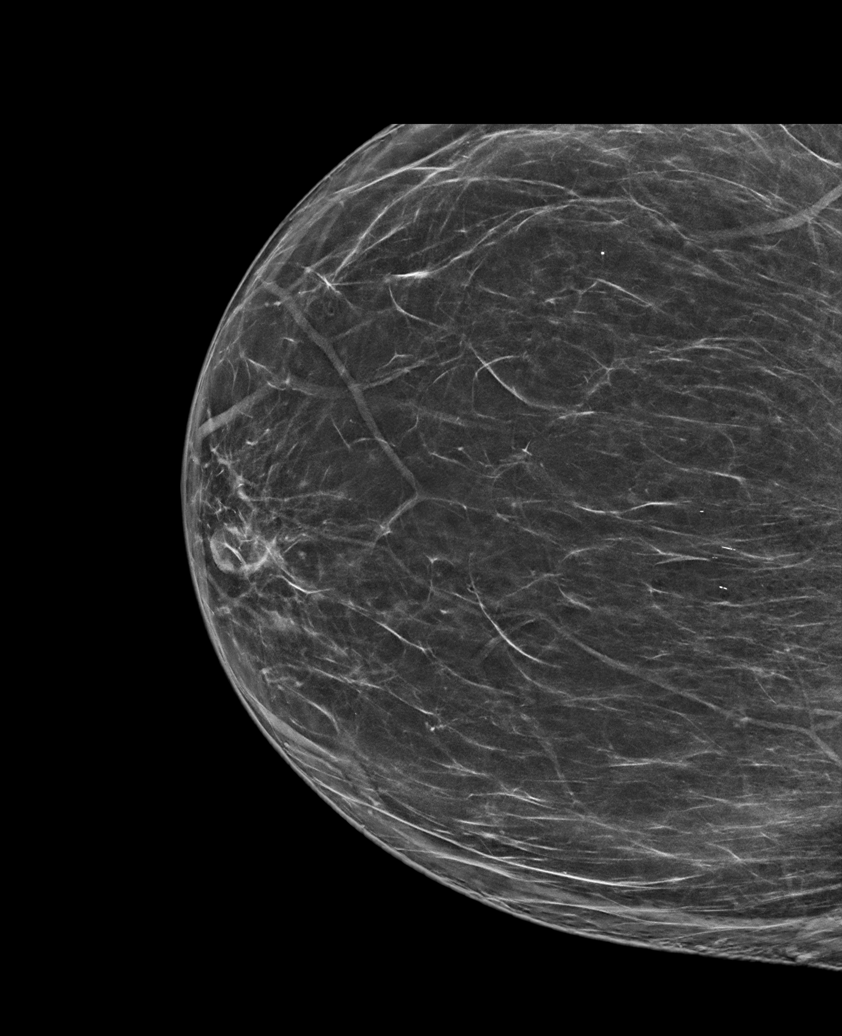

[L MLO synth-2D (2 of 2)]
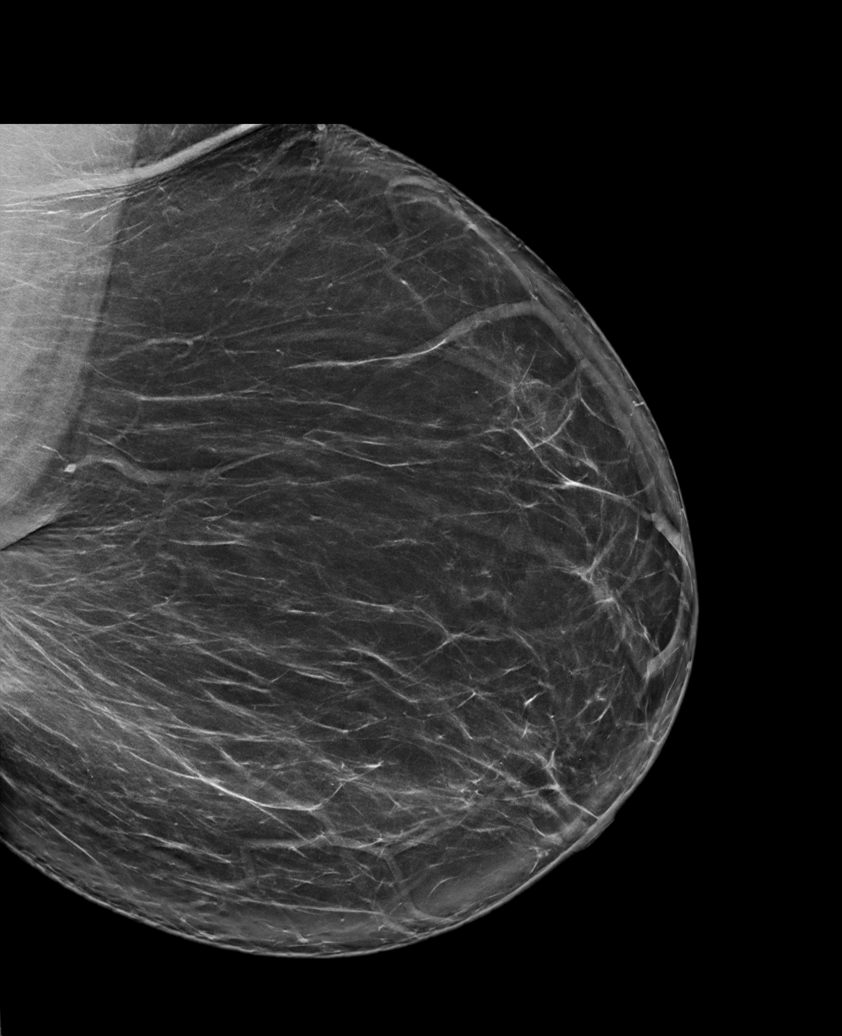

[R MLO synth-2D (2 of 2)]
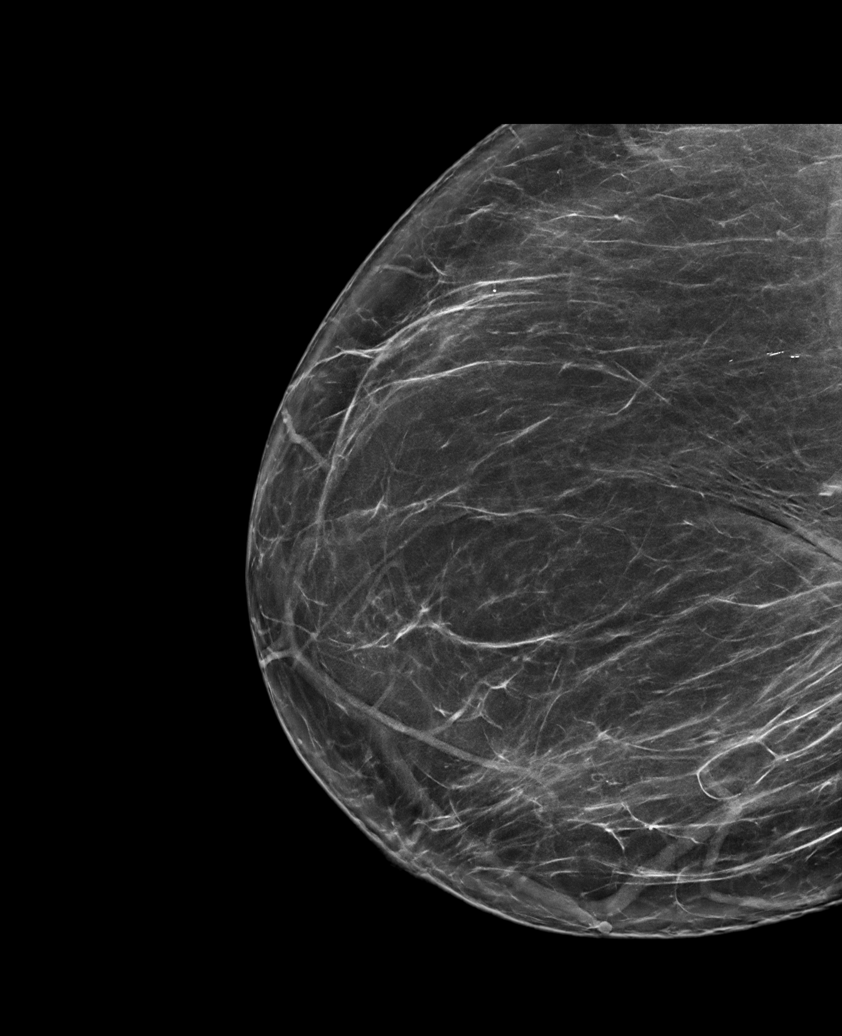

[6 of 36 positions shown; findings below may reference images not displayed]

FINDINGS: There are no findings suspicious for malignancy. Images were
processed with CAD.
IMPRESSION: No mammographic evidence of malignancy. A result letter of this
screening mammogram will be mailed directly to the patient.

RECOMMENDATION:
Screening mammogram in one year. (Code:[id number])

BI-RADS CATEGORY  1: Negative.

## 2021-05-20 ENCOUNTER — Encounter (INDEPENDENT_AMBULATORY_CARE_PROVIDER_SITE_OTHER): Payer: Self-pay

## 2021-05-27 ENCOUNTER — Other Ambulatory Visit: Payer: Self-pay

## 2021-05-27 ENCOUNTER — Ambulatory Visit (AMBULATORY_SURGERY_CENTER): Payer: Self-pay

## 2021-05-27 VITALS — Ht 59.0 in | Wt 209.0 lb

## 2021-05-27 DIAGNOSIS — Z86010 Personal history of colonic polyps: Secondary | ICD-10-CM

## 2021-05-27 MED ORDER — NA SULFATE-K SULFATE-MG SULF 17.5-3.13-1.6 GM/177ML PO SOLN: 1.0000 | Freq: Once | ORAL | 0 refills | Status: AC

## 2021-05-27 NOTE — Progress Notes (Signed)
Denies allergies to eggs or soy products. Denies complication of anesthesia or sedation. Denies use of weight loss medication. Denies use of O2.   Emmi instructions given for colonoscopy.  

## 2021-05-30 DIAGNOSIS — Z0289 Encounter for other administrative examinations: Secondary | ICD-10-CM

## 2021-05-31 ENCOUNTER — Emergency Department (HOSPITAL_BASED_OUTPATIENT_CLINIC_OR_DEPARTMENT_OTHER)
Admission: EM | Admit: 2021-05-31 | Discharge: 2021-05-31 | Disposition: A | Discharge status: Home / Self Care | Payer: Medicare Other | Attending: Emergency Medicine | Admitting: Emergency Medicine

## 2021-05-31 ENCOUNTER — Encounter (HOSPITAL_BASED_OUTPATIENT_CLINIC_OR_DEPARTMENT_OTHER): Payer: Self-pay | Admitting: Urology

## 2021-05-31 ENCOUNTER — Emergency Department (HOSPITAL_BASED_OUTPATIENT_CLINIC_OR_DEPARTMENT_OTHER): Payer: Medicare Other

## 2021-05-31 ENCOUNTER — Ambulatory Visit (INDEPENDENT_AMBULATORY_CARE_PROVIDER_SITE_OTHER): Payer: Medicare Other | Admitting: Internal Medicine

## 2021-05-31 ENCOUNTER — Encounter: Payer: Self-pay | Admitting: Internal Medicine

## 2021-05-31 ENCOUNTER — Other Ambulatory Visit: Payer: Self-pay

## 2021-05-31 VITALS — BP 110/60 | HR 68 | Ht 59.0 in | Wt 214.4 lb

## 2021-05-31 DIAGNOSIS — W25XXXA Contact with sharp glass, initial encounter: Secondary | ICD-10-CM | POA: Diagnosis not present

## 2021-05-31 DIAGNOSIS — L299 Pruritus, unspecified: Secondary | ICD-10-CM | POA: Insufficient documentation

## 2021-05-31 DIAGNOSIS — Z91040 Latex allergy status: Secondary | ICD-10-CM | POA: Insufficient documentation

## 2021-05-31 DIAGNOSIS — S90812A Abrasion, left foot, initial encounter: Secondary | ICD-10-CM | POA: Diagnosis not present

## 2021-05-31 DIAGNOSIS — S90811A Abrasion, right foot, initial encounter: Secondary | ICD-10-CM | POA: Diagnosis not present

## 2021-05-31 DIAGNOSIS — Z7984 Long term (current) use of oral hypoglycemic drugs: Secondary | ICD-10-CM | POA: Diagnosis not present

## 2021-05-31 DIAGNOSIS — Z794 Long term (current) use of insulin: Secondary | ICD-10-CM | POA: Insufficient documentation

## 2021-05-31 DIAGNOSIS — E041 Nontoxic single thyroid nodule: Secondary | ICD-10-CM | POA: Diagnosis not present

## 2021-05-31 DIAGNOSIS — S99929A Unspecified injury of unspecified foot, initial encounter: Secondary | ICD-10-CM | POA: Diagnosis present

## 2021-05-31 DIAGNOSIS — E1142 Type 2 diabetes mellitus with diabetic polyneuropathy: Secondary | ICD-10-CM | POA: Insufficient documentation

## 2021-05-31 DIAGNOSIS — T07XXXA Unspecified multiple injuries, initial encounter: Secondary | ICD-10-CM

## 2021-05-31 DIAGNOSIS — Z7982 Long term (current) use of aspirin: Secondary | ICD-10-CM | POA: Diagnosis not present

## 2021-05-31 DIAGNOSIS — G6289 Other specified polyneuropathies: Secondary | ICD-10-CM

## 2021-05-31 NOTE — Discharge Instructions (Signed)
Please use Tylenol or ibuprofen for pain.  You may use 600 mg ibuprofen every 6 hours or 1000 mg of Tylenol every 6 hours.  You may choose to alternate between the 2.  This would be most effective.  Not to exceed 4 g of Tylenol within 24 hours.  Not to exceed 3200 mg ibuprofen 24 hours.  Recommend moisturizer, to help with dryness.  You can try some Benadryl to help with itching and sleep tonight.  I recommend he follow-up with your primary care doctor for further evaluation if your symptoms continue despite treatment as discussed above.  I do not see any evidence of retained glass at this time, or any evidence of significant or infected foot injury.

## 2021-05-31 NOTE — Progress Notes (Addendum)
Patient ID: [name], female   DOB: 1961/01/16, 61 y.o.   MRN: [id number]  This visit occurred during the SARS-CoV-2 public health emergency.  Safety protocols were in place, including screening questions prior to the visit, additional usage of staff PPE, and extensive cleaning of exam room while observing appropriate contact time as indicated for disinfecting solutions.   HPI  [name] is a 61 y.o.-year-old female, referred by her PCP, Sonia Side., FNP, for evaluation and management of thyroid nodule.  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 She was followed for this by surgery reportedly (no records available for review) with imaging every 3-6 mo. Since no growth was detected >> she did not have surgery.  She was also reported to have a mediastinal mass apparently connected to the thyroid on 2 CT angio studies from 2021 (3 x 2.5 cm in size).  She also has a history of PE in 2008.  She presented with chest pain and cough and was again investigated for PE via a CT chest in 02/2021 that showed a retrosternal likely right thyroid lobe nodule.  Upon questioning, she does mention she noticed pressure in neck and neck swelling ~1 year ago but this improved.  Reviewed available reports/images/pathology results: Chest CT angio (08/14/2019):  Enlarged, heterogeneous and multinodular thyroid including a nodule which extends inferiorly from the right thyroid lobe into the thoracic inlet measuring up to 2.3 cm in size.   Chest CT angio (09/25/2019): Thyroid remains enlarged with a dominant mass arising from the right lobe of the thyroid measuring 3.0 x 2.5 cm. Thyroid otherwise appears somewhat inhomogeneous. There is no appreciable thoracic adenopathy. Small hiatal hernia noted.  Thyroid U/S (05/27/2020): Parenchymal Echotexture: Moderately heterogenous  Isthmus: 0.7 cm  Right lobe: 6.5 x 3 1 x 2.8 cm   Left lobe: 4.5 x 1.6 x 1.5 cm  _________________________________________________________   Nodule # 1:  Location: Right; Inferior  Maximum size: 3.8 cm; Other 2 dimensions: 3.1 x 2.8 cm  Composition: solid/almost completely solid (2)  Echogenicity: isoechoic (1)  **Given size (>/= 2.5 cm) and appearance, fine needle aspiration of this mildly suspicious nodule should be considered based on TI-RADS criteria.  _________________________________________________________   IMPRESSION: Right inferior solid thyroid nodule (versus pseudonodule given diffusely heterogeneous thyroid parenchyma) measuring up to approximately 3.8 cm, mildly suspicious for malignancy (TI-RADS category 3). This lesion meets criteria for tissue sampling. Recommend ultrasound guided fine needle aspiration.  FNA (11/04/2020): Clinical History: Right inferior 3.8cm; Other 2 dimensions: 3.1 x 2.8cm,  Solid / almost completely solid, Isoechoic, TI-RADS total points 3  Specimen Submitted:  A. THYROID, RLP, FINE NEEDLE ASPIRATION:   FINAL MICROSCOPIC DIAGNOSIS:  - Consistent with benign follicular nodule (Bethesda category II)   SPECIMEN ADEQUACY:  Satisfactory for evaluation   Chest CT with and without contrast (03/07/2021):  Stable, rounded, retrosternal density that is slightly exophytic from the large right thyroid lobe.   Previously seen 3.4 cm right thyroid nodule was not visible anymore.  Pt denies: - feeling nodules in neck - choking - SOB with lying down She has occasional dysphagia with pills.  She also has hoarseness. Sometimes discomfort lying down on back.  I reviewed pt's thyroid tests: Lab Results  Component Value Date   TSH 0.506 04/23/2021   TSH 0.458 03/05/2019   TSH 0.638 11/20/2017   TSH 0.112 (L) 05/24/2016   TSH 0.345 (L)  06/18/2014   TSH 0.167 (L) 10/28/2012   TSH 0.218 (L) 01/09/2012   TSH 0.681 08/06/2007   TSH 0.559 04/05/2007   TSH 0.449 01/16/2007   FREET4 1.39 11/20/2017    FREET4 1.32 08/06/2007    Pt mentions: - fatigue - hot flushes - anxiety/depression - weight gain  No: - tremors - palpitations - hyperdefecation/constipation - dry skin - hair loss  + FH of thyroid ds. - in sister (? Condition). No known FH of thyroid cancer. No h/o radiation tx to head or neck.  No recent contrast studies. No steroid use. No herbal supplements. No Biotin supplements or Hair, Skin and Nails vitamins.  Pt. has a history of DM2 -on Ozempic, Jentadueto; HbA1c 8.4% (03/08/2021).  This is managed by PCP. She [facility]  Pt. also has a history of CAD, dCHF, h/o TIA, HL, HTN, OSA (on CPAP), glaucoma, h/o PE in 2008, depression.  ROS: Constitutional: + See HPI, + poor sleep Eyes: no blurry vision, no xerophthalmia ENT: no sore throat,  + see HPI Cardiovascular: + Occasional CP and SOB/no palpitations/+ leg swelling Respiratory: no cough/SOB Gastrointestinal: + Nausea and diarrhea Musculoskeletal: no muscle/joint aches Skin: no rashes, + easy bruising, + hair loss Neurological: no tremors/numbness/tingling/dizziness, + headache Psychiatric: + both: depression/anxiety  Past Medical History:  Diagnosis Date   Allergy    Anginal pain ([place])    a. NL cath in 2008;  b. Myoview 03/2011: dec uptake along mid anterior wall on stress imaging -> ? attenuation vs. ischemia, EF 65%;  c. Echo 04/2011: EF 55-60%, no RWMA, Gr 2 dd   Anxiety    Arthritis    Asthma    Back pain    Bone cancer (HCC)    Cancer (HCC)    Chest pain    CHF (congestive heart failure) (HCC)    Clotting disorder (HCC)    Depression    Diabetes mellitus    Drug use    Dyspnea    Frequent urination    GERD (gastroesophageal reflux disease)    Glaucoma    HLD (hyperlipidemia)    Hypertension    IBS (irritable bowel syndrome)    Joint pain    Lactose intolerance    Leg edema    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 benign thymoma   Neuromuscular disorder  (HCC)    Obesity    Palpitations    Pneumonia 05/2016   double   Pulmonary edema    Pulmonary embolism ([place])    a. 2008 -> coumadin x 6 mos.   Rheumatoid arthritis ([place])    Sleep apnea    on CPAP 02/2018   Thyroid disease    TIA (transient ischemic attack)    Urinary urgency    Past Surgical History:  Procedure Laterality Date   ABDOMINAL HYSTERECTOMY  2005   APPENDECTOMY     CARDIAC CATHETERIZATION     Normal   CARDIAC CATHETERIZATION N/A 09/30/2014   Procedure: Left Heart Cath and Coronary Angiography;  Surgeon: Sherren Mocha, MD;  Location: [facility];  Service: Cardiovascular;  Laterality: N/A;   LAPAROSCOPIC APPENDECTOMY N/A 06/03/2012   Procedure: APPENDECTOMY LAPAROSCOPIC;  Surgeon: Stark Klein, MD;  Location: [facility];  Service: General;  Laterality: N/A;   Left knee surgery  2008   LEG SURGERY     TONSILLECTOMY     TOTAL HIP ARTHROPLASTY Left 06/11/2018   Procedure: LEFT TOTAL HIP ARTHROPLASTY ANTERIOR APPROACH;  Surgeon: Meredith Pel, MD;  Location: [facility];  Service: Orthopedics;  Laterality: Left;   TOTAL KNEE ARTHROPLASTY Left 08/22/2016   Procedure: TOTAL KNEE ARTHROPLASTY;  Surgeon: Meredith Pel, MD;  Location: [facility];  Service: Orthopedics;  Laterality: Left;   TUBAL LIGATION  1989   Social History   Socioeconomic History   Marital status: Divorced    Spouse name: Not on file   Number of children: 3   Years of education: Not on file   Highest education level: Not on file  Occupational History   Occupation: UNEMPLOYED    Employer: DISABLED  Tobacco Use   Smoking status: Former    Packs/day: 0.50    Years: 15.00    Pack years: 7.50    Types: Cigarettes    Quit date: 04/24/1985    Years since quitting: 36.1   Smokeless tobacco: Never  Vaping Use   Vaping Use: Never used  Substance and Sexual Activity   Alcohol use: No    Alcohol/week: 0.0 standard drinks   Drug use: Not Currently    Types: Marijuana   Sexual activity: Never  Other  Topics Concern   Not on file  Social History Narrative   Lives in [place] by herself.  Disabled.  Caffeine 1 cup avg daily.  3 kids, 6 grandkids.     Water aerobics 3 times a week.   Social Determinants of Health   Financial Resource Strain: Not on file  Food Insecurity: Not on file  Transportation Needs: Not on file  Physical Activity: Not on file  Stress: Not on file  Social Connections: Not on file  Intimate Partner Violence: Not on file   Current Outpatient Medications on File Prior to Visit  Medication Sig Dispense Refill   albuterol (PROVENTIL HFA;VENTOLIN HFA) 108 (90 Base) MCG/ACT inhaler Inhale 2 puffs into the lungs every 6 (six) hours as needed for wheezing or shortness of breath.     aspirin 81 MG chewable tablet Chew 81 mg by mouth daily.     atorvastatin (LIPITOR) 40 MG tablet Take 40 mg by mouth at bedtime.      busPIRone (BUSPAR) 30 MG tablet Take 30 mg by mouth 2 (two) times daily.     carvedilol (COREG) 12.5 MG tablet Take 12.5 mg by mouth daily.     diclofenac Sodium (VOLTAREN) 1 % GEL Apply 2 g topically 4 (four) times daily as needed (joint/muscle pain).     furosemide (LASIX) 40 MG tablet Take 60 mg by mouth daily.     hydrOXYzine (VISTARIL) 25 MG capsule Take 25 mg by mouth 3 (three) times daily as needed for itching.     isosorbide dinitrate (ISORDIL) 20 MG tablet Take 20 mg by mouth 2 (two) times daily.     JENTADUETO XR 2.08-998 MG TB24 Take 1 tablet by mouth daily.     lidocaine (LIDODERM) 5 % Place 1 patch onto the skin daily as needed for pain.     lisinopril (PRINIVIL,ZESTRIL) 20 MG tablet Take 20 mg by mouth daily.      loperamide (IMODIUM A-D) 2 MG tablet Take 4 mg by mouth 4 (four) times daily as needed for diarrhea or loose stools.      MYRBETRIQ 25 MG TB24 tablet Take 1 tablet (25 mg total) by mouth daily. 30 tablet 0   nystatin (MYCOSTATIN/NYSTOP) powder Apply 1 application topically 3 (three) times daily as needed for rash.     ondansetron (ZOFRAN) 4  MG tablet  Take 1 tablet (4 mg total) by mouth every 6 (six) hours. (Patient taking differently: Take 4 mg by mouth every 6 (six) hours as needed for nausea or vomiting.) 12 tablet 0   pantoprazole (PROTONIX) 40 MG tablet Take 1 tablet (40 mg total) by mouth 2 (two) times daily. 60 tablet 0   potassium chloride (KLOR-CON M10) 10 MEQ tablet Take 1 tablet (10 mEq total) by mouth 2 (two) times daily. 60 tablet 1   Semaglutide,0.25 or 0.5MG /DOS, (OZEMPIC, 0.25 OR 0.5 MG/DOSE,) 2 MG/1.5ML SOPN Inject 0.5 mg into the skin once a week. DX:E11.69 (Patient not taking: Reported on 03/08/2021) 1.5 mL 0   topiramate (TOPAMAX) 25 MG tablet Take 1 tablet (25 mg total) by mouth at bedtime. 30 tablet 0   No current facility-administered medications on file prior to visit.   Allergies  Allergen Reactions   Penicillins Swelling and Other (See Comments)    Has patient had a PCN reaction causing immediate rash, facial/tongue/throat swelling, SOB or lightheadedness with hypotension: YES Has patient had a PCN reaction causing severe rash involving mucus membranes or skin necrosis: NO Has patient had a PCN reaction that required hospitalization NO Has patient had a PCN reaction occurring within the last 10 years: NO If all of the above answers are "NO", then may proceed with Cephalosporin use.   Hydrocodone-Acetaminophen Itching    confusion   Latex Rash   Sulfa Antibiotics Diarrhea, Itching and Rash   Family History  Problem Relation Age of Onset   Emphysema Mother    Arthritis Mother    Heart failure Mother        alive @ 33   Stroke Mother    Diabetes Mother    Hypertension Mother    Hyperlipidemia Mother    Depression Mother    Anxiety disorder Mother    Heart disease Father        died @ 49's.   Stroke Father    Diabetes Father    Hyperlipidemia Father    Hypertension Father    Bipolar disorder Father    Sleep apnea Father    Alcoholism Father    Drug abuse Father    Diabetes Sister     Asthma Brother    Breast cancer Maternal Aunt    Colon cancer Maternal Grandfather    Heart disease Paternal Grandfather    Esophageal cancer Neg Hx    Stomach cancer Neg Hx    Rectal cancer Neg Hx     PE: BP 110/60 (BP Location: Right Arm, Patient Position: Sitting, Cuff Size: Normal)    Pulse 68    Ht 4\' 11"  (1.499 m)    Wt 214 lb 6.4 oz (97.3 kg)    SpO2 95%    BMI 43.30 kg/m  Wt Readings from Last 3 Encounters:  05/31/21 214 lb 6.4 oz (97.3 kg)  05/27/21 209 lb (94.8 kg)  04/22/21 212 lb (96.2 kg)   Constitutional: overweight, in NAD Eyes: PERRLA, EOMI, no exophthalmos ENT: moist mucous membranes, + R thyromegaly, no cervical lymphadenopathy Cardiovascular: RRR, No MRG Respiratory: CTA B Musculoskeletal: no deformities, strength intact in all 4;  Skin: moist, warm, no rashes Neurological: no tremor with outstretched hands, DTR normal in all 4  ASSESSMENT: 1. Thyroid nodule  PLAN: 1. Thyroid nodule - I reviewed the images of her thyroid ultrasound along with the patient. I pointed out that the dominant nodule is large, this being a risk factor for cancer. Otherwise, the nodule does not  have features of a high risk nodule - it is: - not hypoechoic (it is isoechoic) - without microcalcifications - without internal blood flow - more wide than tall - well delimited from surrounding tissue Pt does not have a known thyroid cancer family history or a personal history of RxTx to head/neck. All these would favor benignity.  - this nodule was biopsied in 2022 with benign results.  The risk of cancer in situ nodule is very low, but not completely 0. - we discussed that if the nodule starts increasing in size or change ultrasound characteristics, she may need a new biopsy. - therefore, at today's visit, we decided to repeat her thyroid ultrasound.  I am hoping that we will get a better characterization of the exophytic right thyroid nodule.  I discussed with the patient that the  report of her most recent chest CT is a little confusing: The report mentions the mediastinal mass, but it also mentions that the right thyroid nodule is not visible anymore.  I am hoping that this confusion will be clarified on the new ultrasound.  Of note, the mediastinal mass is not new per review of the CT scan reports from 2021.  We discussed that if she has no neck compression symptoms or any signs that this mass may compress her trachea, there is no clear indication for surgery. - I did explain that, while thyroid surgery is not a complicated one, it still can have side effects and also she might have a risk of ~25-33% of becoming hypothyroid after hemithyroidectomy.  - at next visit I plan to check her TFTs.  If they are low, we may need a thyroid uptake and scan. - I will see her back in 6 months  Orders Placed This Encounter  Procedures   US THYROID   Thyroid U/S (06/10/2021): Parenchymal Echotexture: Moderately heterogenous Isthmus: 0.7 cm  Right lobe: 6.2 x 2.3 x 2.5 cm  Left lobe: 4.1 x 1.3 x 2.0 cm  _________________________________________________________   Diffusely heterogeneous thyroid gland with extensive goitrous change. Multiple small thyroid nodules are identified in bilaterally measuring less than 1 cm. These are considered incidental and warrant no further follow-up.   Nodule # 3: Approximately 1.3 cm isoechoic solid nodule in the right mid gland does not meet criteria to warrant further evaluation.   Nodule # 4: Previously biopsied mass in the lower pole of the right gland measures 3.1 x 3.0 x 3.0 cm. This is slightly smaller than 3.1 x 3.1 x 2.8 cm measured previously.   No new nodules or suspicious features.   IMPRESSION: 1. Similar appearance of diffusely enlarged, heterogeneous and multinodular thyroid gland most consistent with multinodular goiter. 2. Slight interval decrease in size of previously biopsied mass in the lower pole of the right gland. 3.  No new nodules or suspicious features identified.   Philemon Kingdom, MD PhD [facility]

## 2021-05-31 NOTE — Patient Instructions (Addendum)
We will schedule a new thyroid U/S.  Please come back for a follow-up appointment in 6 months.  Thyroid Nodule A thyroid nodule is an isolated growth of thyroid cells that forms a lump in your thyroid gland. The thyroid gland is a butterfly-shaped gland. It is found in the lower front of your neck. This gland sends chemical messengers (hormones) through your blood to all parts of your body. These hormones are important in regulating your body temperature and helping your body to use energy. Thyroid nodules are common. Most are not cancerous (benign). You may have one nodule or several nodules. Different types of thyroid nodules include nodules that: Grow and fill with fluid (thyroid cysts). Produce too much thyroid hormone (hot nodules or hyperthyroid). Produce no thyroid hormone (cold nodules or hypothyroid). Form from cancer cells (thyroid cancers). What are the causes? In most cases, the cause of this condition is not known. What increases the risk? The following factors may make you more likely to develop this condition. Age. Thyroid nodules become more common in people who are older than 63 years of age. Gender. Benign thyroid nodules are more common in women. Cancerous (malignant) thyroid nodules are more common in men. A family history that includes: Thyroid nodules. Pheochromocytoma. Thyroid carcinoma. Hyperparathyroidism. Certain kinds of thyroid diseases, such as Hashimoto's thyroiditis. Lack of iodine in your diet. A history of head and neck radiation, such as from previous cancer treatment. What are the signs or symptoms? In many cases, there are no symptoms. If you have symptoms, they may include: A lump in your lower neck. Feeling a lump or tickle in your throat. Pain in your neck, jaw, or ear. Having trouble swallowing. Hot nodules may cause symptoms that include: Weight loss. Warm, flushed skin. Feeling hot. Feeling nervous. A racing heartbeat. Cold nodules may  cause symptoms that include: Weight gain. Dry skin. Brittle hair. This may also occur with hair loss. Feeling cold. Fatigue. Thyroid cancer nodules may cause symptoms that include: Hard nodules that feel stuck to the thyroid gland. Hoarseness. Lumps in the glands near your thyroid (lymph nodes). How is this diagnosed? A thyroid nodule may be felt by your health care provider during a physical exam. This condition may also be diagnosed based on your symptoms. You may also have tests, including: An ultrasound. This may be done to confirm the diagnosis. A biopsy. This involves taking a sample from the nodule and looking at it under a microscope. Blood tests to make sure that your thyroid is working properly. A thyroid scan. This test uses a radioactive tracer injected into a vein to create an image of the thyroid gland on a computer screen. Imaging tests such as MRI or CT scan. These may be done if: Your nodule is large. Your nodule is blocking your airway. Cancer is suspected. How is this treated? Treatment depends on the cause and size of your nodule or nodules. If the nodule is benign, treatment may not be necessary. Your health care provider may monitor the nodule to see if it goes away without treatment. If the nodule continues to grow, is cancerous, or does not go away, treatment may be needed. Treatment may include: Having a cystic nodule drained with a needle. Ablation therapy. In this treatment, alcohol is injected into the area of the nodule to destroy the cells. Ablation with heat (thermal ablation) may also be used. Radioactive iodine. In this treatment, radioactive iodine is given as a pill or liquid that you drink. This substance  causes the thyroid nodule to shrink. Surgery to remove the nodule. Part or all of your thyroid gland may need to be removed as well. Medicines. Follow these instructions at home: Pay attention to any changes in your nodule. Take over-the-counter and  prescription medicines only as told by your health care provider. Keep all follow-up visits as told by your health care provider. This is important. Contact a health care provider if: Your voice changes. You have trouble swallowing. You have pain in your neck, ear, or jaw that is getting worse. Your nodule gets bigger. Your nodule starts to make it harder for you to breathe. Your muscles look like they are shrinking (muscle wasting). Get help right away if: You have chest pain. There is a loss of consciousness. You have a sudden fever. You feel confused. You are seeing or hearing things that other people do not see or hear (having hallucinations). You feel very weak. You have mood swings. You feel very restless. You feel suddenly nauseous or throw up. You suddenly have diarrhea. Summary A thyroid nodule is an isolated growth of thyroid cells that forms a lump in your thyroid gland. Thyroid nodules are common. Most are not cancerous (benign). You may have one nodule or several nodules. Treatment depends on the cause and size of your nodule or nodules. If the nodule is benign, treatment may not be necessary. Your health care provider may monitor the nodule to see if it goes away without treatment. If the nodule continues to grow, is cancerous, or does not go away, treatment may be needed. This information is not intended to replace advice given to you by your health care provider. Make sure you discuss any questions you have with your health care provider. Document Revised: 11/23/2017 Document Reviewed: 11/26/2017 Elsevier Patient Education  [place].

## 2021-05-31 NOTE — ED Triage Notes (Signed)
Sunday had hot glass bowl shatter in hands and land on feet  Concern for glass in feet  States having issues with sensations generalized to body H/o Diabetes   No visible injury to hands  Very small abrasion to bottom of feet bilaterally

## 2021-05-31 NOTE — ED Provider Notes (Signed)
[facility] Provider Note   CSN: [id number] Arrival date & time: 05/31/21  1738     History  Chief Complaint  Patient presents with   Hand Injury   Foot Injury    [name] is a 61 y.o. female with a past medical history significant for diabetes, peripheral neuropathy who presents with concern for having a glass bowl shatter, with lacerations on hands and feet 2 days ago.  Patient reports that she is having sensation of retained glass in her feet, as well as some anxiety about the cement, itching throughout the body.   Hand Injury Foot Injury     Home Medications Prior to Admission medications   Medication Sig Start Date End Date Taking? Authorizing Provider  albuterol (PROVENTIL HFA;VENTOLIN HFA) 108 (90 Base) MCG/ACT inhaler Inhale 2 puffs into the lungs every 6 (six) hours as needed for wheezing or shortness of breath.    [provider]  aspirin 81 MG chewable tablet Chew 81 mg by mouth daily.    [provider]  atorvastatin (LIPITOR) 40 MG tablet Take 40 mg by mouth at bedtime.     [provider]  busPIRone (BUSPAR) 30 MG tablet Take 30 mg by mouth 2 (two) times daily. 12/18/19   [provider]  carvedilol (COREG) 12.5 MG tablet Take 12.5 mg by mouth daily. 08/11/19   [provider]  diclofenac Sodium (VOLTAREN) 1 % GEL Apply 2 g topically 4 (four) times daily as needed (joint/muscle pain). 09/20/19   [provider]  furosemide (LASIX) 40 MG tablet Take 60 mg by mouth daily.    Rai, Vernelle Emerald, MD  hydrOXYzine (VISTARIL) 25 MG capsule Take 25 mg by mouth 3 (three) times daily as needed for itching.    [provider]  isosorbide dinitrate (ISORDIL) 20 MG tablet Take 20 mg by mouth 2 (two) times daily.    [provider]  JENTADUETO XR 2.08-998 MG TB24 Take 1 tablet by mouth daily. 12/13/20   [provider]  lidocaine (LIDODERM) 5 % Place 1 patch onto the  skin daily as needed for pain. 02/11/21   [provider]  lisinopril (PRINIVIL,ZESTRIL) 20 MG tablet Take 20 mg by mouth daily.  09/21/14   [provider]  loperamide (IMODIUM A-D) 2 MG tablet Take 4 mg by mouth 4 (four) times daily as needed for diarrhea or loose stools.     [provider]  MYRBETRIQ 25 MG TB24 tablet Take 1 tablet (25 mg total) by mouth daily. 03/13/19   Dennard Nip D, MD  nystatin (MYCOSTATIN/NYSTOP) powder Apply 1 application topically 3 (three) times daily as needed for rash.    [provider]  ondansetron (ZOFRAN) 4 MG tablet Take 1 tablet (4 mg total) by mouth every 6 (six) hours. Patient taking differently: Take 4 mg by mouth every 6 (six) hours as needed for nausea or vomiting. 09/03/20   Blanchie Dessert, MD  pantoprazole (PROTONIX) 40 MG tablet Take 1 tablet (40 mg total) by mouth 2 (two) times daily. 03/09/21   Domenic Polite, MD  potassium chloride (KLOR-CON M10) 10 MEQ tablet Take 1 tablet (10 mEq total) by mouth 2 (two) times daily. 05/27/18   Jearld Lesch A, DO  Semaglutide,0.25 or 0.5MG /DOS, (OZEMPIC, 0.25 OR 0.5 MG/DOSE,) 2 MG/1.5ML SOPN Inject 0.5 mg into the skin once a week. DX:E11.69 Patient not taking: Reported on 03/08/2021 04/12/20   Mellody Dance, DO  topiramate (TOPAMAX) 25 MG tablet  Take 1 tablet (25 mg total) by mouth at bedtime. 04/12/20   Mellody Dance, DO      Allergies    Penicillins, Hydrocodone-acetaminophen, Latex, and Sulfa antibiotics    Review of Systems   Review of Systems  Skin:  Positive for wound.  All other systems reviewed and are negative.  Physical Exam Updated Vital Signs BP (!) 155/72 (BP Location: Right Arm)    Pulse 82    Temp 98.1 F (36.7 C) (Oral)    Resp 18    Ht 4\' 11"  (1.499 m)    Wt 97.2 kg    SpO2 97%    BMI 43.28 kg/m  Physical Exam Vitals and nursing note reviewed.  Constitutional:      General: She is not in acute distress.    Appearance: Normal appearance.   HENT:     Head: Normocephalic and atraumatic.  Eyes:     General:        Right eye: No discharge.        Left eye: No discharge.  Cardiovascular:     Rate and Rhythm: Normal rate and regular rhythm.  Pulmonary:     Effort: Pulmonary effort is normal. No respiratory distress.  Musculoskeletal:        General: No deformity.  Skin:    General: Skin is warm and dry.     Comments: Patient with small abrasions bilateral bottoms of soles.  I do not appreciate any retained foreign bodies.  There is 1 area on the left foot around the heel where there is a small punctate white callus versus abrasion.  I do not appreciate any obvious retained foreign body on exam, however tempted to perform foreign body removal nonetheless.  Neurological:     Mental Status: She is alert and oriented to person, place, and time.  Psychiatric:        Mood and Affect: Mood normal.        Behavior: Behavior normal.    ED Results / Procedures / Treatments   Labs (all labs ordered are listed, but only abnormal results are displayed) Labs Reviewed - No data to display  EKG None  Radiology DG Foot Complete Left  Result Date: 05/31/2021 CLINICAL DATA:  Glass pulse shattered, concern for retained foreign body, abrasions EXAM: LEFT FOOT - COMPLETE 3+ VIEW; RIGHT FOOT COMPLETE - 3+ VIEW COMPARISON:  None. FINDINGS: Left foot: Frontal, oblique, and lateral views are obtained. No fracture, subluxation, or dislocation. Either resorption or resection of the fifth middle phalanx. Prominent inferior calcaneal spur. Calcifications within the plantar fascia. Mild diffuse osteoarthritis greatest in the midfoot. Soft tissues are unremarkable. No radiopaque foreign body. Right foot: Frontal, oblique, lateral views are obtained. No acute fracture, subluxation, or dislocation. Mild diffuse osteoarthritis greatest in the midfoot. Prominent inferior calcaneal spur. Calcifications within the plantar fascia. Soft tissues are unremarkable.  No evidence of radiopaque foreign body. IMPRESSION: 1. No evidence of radiopaque foreign body within either foot. 2. Bilateral osteoarthritis. 3. Prominent inferior calcaneal spurs bilaterally, with calcifications of the plantar fascia. Electronically Signed   By: Randa Ngo M.D.   On: 05/31/2021 18:46   DG Foot Complete Right  Result Date: 05/31/2021 CLINICAL DATA:  Glass pulse shattered, concern for retained foreign body, abrasions EXAM: LEFT FOOT - COMPLETE 3+ VIEW; RIGHT FOOT COMPLETE - 3+ VIEW COMPARISON:  None. FINDINGS: Left foot: Frontal, oblique, and lateral views are obtained. No fracture, subluxation, or dislocation. Either resorption or resection of the fifth middle phalanx.  Prominent inferior calcaneal spur. Calcifications within the plantar fascia. Mild diffuse osteoarthritis greatest in the midfoot. Soft tissues are unremarkable. No radiopaque foreign body. Right foot: Frontal, oblique, lateral views are obtained. No acute fracture, subluxation, or dislocation. Mild diffuse osteoarthritis greatest in the midfoot. Prominent inferior calcaneal spur. Calcifications within the plantar fascia. Soft tissues are unremarkable. No evidence of radiopaque foreign body. IMPRESSION: 1. No evidence of radiopaque foreign body within either foot. 2. Bilateral osteoarthritis. 3. Prominent inferior calcaneal spurs bilaterally, with calcifications of the plantar fascia. Electronically Signed   By: Randa Ngo M.D.   On: 05/31/2021 18:46    Procedures Procedures    Medications Ordered in ED Medications - No data to display  ED Course/ Medical Decision Making/ A&P                           Medical Decision Making Amount and/or Complexity of Data Reviewed Radiology: ordered.   This is an anxious patient with concern for retained glass fragments in bilateral bottoms of feet.  I personally interpreted radiographic imaging which shows no evidence of retained radiopaque foreign bodies, or other  damage to the feet including acute fracture, dislocation.  On physical exam I do not appreciate any obvious retained foreign bodies.  I did attempt to perform a foreign body removal 1 focal area on the left foot where patient reports that she did have sensation of foreign body.  I was able to remove several small pieces of callus surrounding the area of punctate foreign body, I do not appreciate any fragment of shattered glass, however patient does report relief of the sensation.  Encourage follow-up with podiatry, ibuprofen, Tylenol for any pain, moisturization of her skin, and Benadryl for any sensation of itching over the next 1-2 nights.  Encourage close monitoring for improvement of sensation of foreign body, wounds to ensure no poorly healing wound, especially in context of diabetes history.  Patient discharged stable condition at this time, return precautions given. Final Clinical Impression(s) / ED Diagnoses Final diagnoses:  Multiple abrasions  Itching  Other polyneuropathy    Rx / DC Orders ED Discharge Orders     None         Dorien Chihuahua 05/31/21 2052    Hayden Rasmussen, MD 06/01/21 [phone]

## 2021-06-01 ENCOUNTER — Other Ambulatory Visit: Payer: Self-pay

## 2021-06-01 ENCOUNTER — Encounter (INDEPENDENT_AMBULATORY_CARE_PROVIDER_SITE_OTHER): Payer: Self-pay | Admitting: Family Medicine

## 2021-06-01 ENCOUNTER — Ambulatory Visit (INDEPENDENT_AMBULATORY_CARE_PROVIDER_SITE_OTHER): Payer: Medicare Other | Admitting: Family Medicine

## 2021-06-01 VITALS — BP 104/58 | HR 86 | Temp 97.9°F | Ht 59.0 in | Wt 209.0 lb

## 2021-06-01 DIAGNOSIS — F32A Depression, unspecified: Secondary | ICD-10-CM

## 2021-06-01 DIAGNOSIS — E1165 Type 2 diabetes mellitus with hyperglycemia: Secondary | ICD-10-CM

## 2021-06-01 DIAGNOSIS — R0602 Shortness of breath: Secondary | ICD-10-CM

## 2021-06-01 DIAGNOSIS — E669 Obesity, unspecified: Secondary | ICD-10-CM

## 2021-06-01 DIAGNOSIS — E785 Hyperlipidemia, unspecified: Secondary | ICD-10-CM

## 2021-06-01 DIAGNOSIS — I152 Hypertension secondary to endocrine disorders: Secondary | ICD-10-CM | POA: Diagnosis not present

## 2021-06-01 DIAGNOSIS — F419 Anxiety disorder, unspecified: Secondary | ICD-10-CM

## 2021-06-01 DIAGNOSIS — Z6841 Body Mass Index (BMI) 40.0 and over, adult: Secondary | ICD-10-CM

## 2021-06-01 DIAGNOSIS — E1159 Type 2 diabetes mellitus with other circulatory complications: Secondary | ICD-10-CM

## 2021-06-01 DIAGNOSIS — E559 Vitamin D deficiency, unspecified: Secondary | ICD-10-CM

## 2021-06-01 DIAGNOSIS — M549 Dorsalgia, unspecified: Secondary | ICD-10-CM | POA: Insufficient documentation

## 2021-06-01 DIAGNOSIS — I5032 Chronic diastolic (congestive) heart failure: Secondary | ICD-10-CM

## 2021-06-01 DIAGNOSIS — G4733 Obstructive sleep apnea (adult) (pediatric): Secondary | ICD-10-CM

## 2021-06-01 DIAGNOSIS — E1169 Type 2 diabetes mellitus with other specified complication: Secondary | ICD-10-CM | POA: Diagnosis not present

## 2021-06-01 DIAGNOSIS — R5383 Other fatigue: Secondary | ICD-10-CM

## 2021-06-01 MED ORDER — SERTRALINE HCL 25 MG PO TABS: 25.0000 mg | ORAL_TABLET | Freq: Every day | ORAL | 0 refills | Status: DC

## 2021-06-02 LAB — LIPID PANEL WITH LDL/HDL RATIO
Cholesterol, Total: 167 mg/dL (ref 100–199)
HDL: 49 mg/dL (ref >39)
LDL Chol Calc (NIH): 87 mg/dL (ref 0–99)
LDL/HDL Ratio: 1.8 ratio (ref 0.0–3.2)
Triglycerides: 182 mg/dL — ABNORMAL HIGH (ref 0–149)
VLDL Cholesterol Cal: 31 mg/dL (ref 5–40)

## 2021-06-02 LAB — VITAMIN D 25 HYDROXY (VIT D DEFICIENCY, FRACTURES): Vit D, 25-Hydroxy: 30 ng/mL (ref 30.0–100.0)

## 2021-06-02 LAB — T4, FREE: Free T4: 1.36 ng/dL (ref 0.82–1.77)

## 2021-06-02 LAB — T3: T3, Total: 128 ng/dL (ref 71–180)

## 2021-06-02 LAB — INSULIN, RANDOM: INSULIN: 26.8 u[IU]/mL — ABNORMAL HIGH (ref 2.6–24.9)

## 2021-06-02 NOTE — Progress Notes (Signed)
Chief Complaint:   [name] (MR# [id number]) is a 61 y.o. female who presents for evaluation and treatment of obesity and related comorbidities. Current BMI is Body mass index is 42.21 kg/m. [name] has been struggling with her weight for many years and has been unsuccessful in either losing weight, maintaining weight loss, or reaching her healthy weight goal.  [name] is currently in the action stage of change and ready to dedicate time achieving and maintaining a healthier weight. [name] is interested in becoming our patient and working on intensive lifestyle modifications including (but not limited to) diet and exercise for weight loss.  Returning pt- last seen 2020-05-09. Pt's mother has passed away. She may  have something in the morning- apple or piece of fruit but is she is not hungry, she doesn't eat. If she is hungry- lettuce, tomato, 4 oz [place], but she gets that in about 4 times a week; Dinner is also hit or miss- may fix something or may not.  [name]'s habits were reviewed today and are as follows: her desired weight loss is 49 lbs, she started gaining weight after she had her 3rd child, her heaviest weight ever was 240 pounds, she is a picky eater and doesn't like to eat healthier foods, she has significant food cravings issues, she skips meals frequently, she is frequently drinking liquids with calories, she frequently makes poor food choices, and she struggles with emotional eating.  Depression Screen [name]'s Food and Mood (modified PHQ-9) score was 9.  Depression screen PHQ 2/9 06/01/2021  Decreased Interest 1  Down, Depressed, Hopeless 1  PHQ - 2 Score 2  Altered sleeping 1  Tired, decreased energy 1  Change in appetite 3  Feeling bad or failure about yourself  1  Trouble concentrating 1  Moving slowly or fidgety/restless 0  Suicidal thoughts 0  PHQ-9 Score 9  Difficult doing work/chores Not difficult at all  Some recent data might be hidden    Subjective:   1. Other fatigue [name] admits to daytime somnolence and admits to waking up still tired. Patient has a history of symptoms of daytime fatigue, morning fatigue, and morning headache. [name] generally gets  5-7  hours of sleep per night, and states that she has generally restful sleep. Snoring is present. Apneic episodes are present. Epworth Sleepiness Score is 9.   2. SOBOE (shortness of breath on exertion) [name] notes increasing shortness of breath with exercising and seems to be worsening over time with weight gain. She notes getting out of breath sooner with activity than she used to. This has gotten worse recently. [name] denies shortness of breath at rest or orthopnea.  3. Type 2 diabetes mellitus with hyperglycemia, without long-term current use of insulin (HCC) Pt's last A1c was 8.4 and no insulin level performed. She was previously on Janumet.  4. Hypertension associated with diabetes ([name]) BP well controlled today. [name] denies chest pain/chest pressure/headache.  5. Hyperlipidemia associated with type 2 diabetes mellitus ([name]) Pt is on atorvastatin.  6. Anxiety and depression Pt sees a therapist. Buspirone is managed by PCP.  7. OSA (obstructive sleep apnea) Pt has a CPAP and wears it with almost 100% compliance. She sleeps much better with CPAP.   8. Chronic diastolic congestive heart failure ([name]) Pt sees Dr. Johnsie Cancel and is on heart failure medication combination.  9. Vitamin D deficiency Historical low levels.  Assessment/Plan:   1. Other fatigue [name] does feel that her weight is causing  her energy to be lower than it should be. Fatigue may be related to obesity, depression or many other causes. Labs will be ordered, and in the meanwhile, [name] will focus on self care including making healthy food choices, increasing physical activity and focusing on stress reduction. Check labs today.  - T3 - T4, free  2. SOBOE (shortness of breath on  exertion) [name] does feel that she gets out of breath more easily that she used to when she exercises. [name]'s shortness of breath appears to be obesity related and exercise induced. She has agreed to work on weight loss and gradually increase exercise to treat her exercise induced shortness of breath. Will continue to monitor closely.  3. Type 2 diabetes mellitus with hyperglycemia, without long-term current use of insulin (HCC) Good blood sugar control is important to decrease the likelihood of diabetic complications such as nephropathy, neuropathy, limb loss, blindness, coronary artery disease, and death. Intensive lifestyle modification including diet, exercise and weight loss are the first line of treatment for diabetes. Check labs today.  - Insulin, random  4. Hypertension associated with diabetes ([place]) [name] is working on healthy weight loss and exercise to improve blood pressure control. We will watch for signs of hypotension as she continues her lifestyle modifications. Check labs today.  5. Hyperlipidemia associated with type 2 diabetes mellitus ([name]) Cardiovascular risk and specific lipid/LDL goals reviewed.  We discussed several lifestyle modifications today and [name] will continue to work on diet, exercise and weight loss efforts. Orders and follow up as documented in patient record.   Counseling Intensive lifestyle modifications are the first line treatment for this issue. Dietary changes: Increase soluble fiber. Decrease simple carbohydrates. Exercise changes: Moderate to vigorous-intensity aerobic activity 150 minutes per week if tolerated. Lipid-lowering medications: see documented in medical record. Check labs today.  - Lipid Panel With LDL/HDL Ratio  6. Anxiety and depression Behavior modification techniques were discussed today to help [name] deal with her anxiety.  Orders and follow up as documented in patient record. [name] will start Zoloft 25 mg as prescribed  below.  Start- sertraline (ZOLOFT) 25 MG tablet; Take 1 tablet (25 mg total) by mouth daily.  Dispense: 30 tablet; Refill: 0  7. OSA (obstructive sleep apnea) Intensive lifestyle modifications are the first line treatment for this issue. We discussed several lifestyle modifications today and she will continue to work on diet, exercise and weight loss efforts. We will continue to monitor. Orders and follow up as documented in patient record. Follow up with [facility].  8. Chronic diastolic congestive heart failure ([place]) Follow up with Dr. Johnsie Cancel- pt is to schedule appt.  9. Vitamin D deficiency Low Vitamin D level contributes to fatigue and are associated with obesity, breast, and colon cancer. She will follow-up for routine testing of Vitamin D, at least 2-3 times per year to avoid over-replacement. Check labs today.  - VITAMIN D 25 Hydroxy (Vit-D Deficiency, Fractures)  10. Obesity with current BMI of 42.4 [name] is currently in the action stage of change and her goal is to continue with weight loss efforts. I recommend Allanah begin the structured treatment plan as follows:  She has agreed to the Category 2 Plan and the [place].  Exercise goals: No exercise has been prescribed at this time.   Behavioral modification strategies: increasing lean protein intake, no skipping meals, meal planning and cooking strategies, keeping healthy foods in the home, and planning for success.  She was informed of the importance of frequent follow-up  visits to maximize her success with intensive lifestyle modifications for her multiple health conditions. She was informed we would discuss her lab results at her next visit unless there is a critical issue that needs to be addressed sooner. Kayal agreed to keep her next visit at the agreed upon time to discuss these results.  Objective:   Blood pressure (!) 104/58, pulse 86, temperature 97.9 F (36.6 C), height 4\' 11"  (1.499 m), weight 209 lb (94.8 kg),  SpO2 97 %. Body mass index is 42.21 kg/m.  EKG: Normal sinus rhythm, rate 81 (04/28/2021).  Indirect Calorimeter completed today shows a VO2 of 226 and a REE of 1555.  Her calculated basal metabolic rate is 9562 thus her basal metabolic rate is better than expected.  General: Cooperative, alert, well developed, in no acute distress. HEENT: Conjunctivae and lids unremarkable. Cardiovascular: Regular rhythm.  Lungs: Normal work of breathing. Neurologic: No focal deficits.   Lab Results  Component Value Date   CREATININE 1.05 (H) 04/22/2021   BUN 17 04/22/2021   NA 142 04/22/2021   K 3.3 (L) 04/22/2021   CL 105 04/22/2021   CO2 30 04/22/2021   Lab Results  Component Value Date   ALT 31 03/09/2021   AST 18 03/09/2021   ALKPHOS 49 03/09/2021   BILITOT 0.6 03/09/2021   Lab Results  Component Value Date   HGBA1C 8.4 (H) 03/08/2021   HGBA1C 7.6 (H) 02/03/2020   HGBA1C 7.5 (H) 10/07/2019   HGBA1C 6.3 (H) 12/26/2018   HGBA1C 6.6 (H) 05/22/2018   Lab Results  Component Value Date   INSULIN 26.8 (H) 06/01/2021   INSULIN 44.4 (H) 02/03/2020   INSULIN 28.1 (H) 12/26/2018   INSULIN 20.2 03/04/2018   INSULIN 53.9 (H) 11/20/2017   Lab Results  Component Value Date   TSH 0.506 04/23/2021   Lab Results  Component Value Date   CHOL 167 06/01/2021   HDL 49 06/01/2021   LDLCALC 87 06/01/2021   TRIG 182 (H) 06/01/2021   CHOLHDL 4.2 04/23/2011   CHOLHDL 4.3 04/23/2011   Lab Results  Component Value Date   WBC 7.4 04/22/2021   HGB 12.5 04/22/2021   HCT 38.2 04/22/2021   MCV 87.8 04/22/2021   PLT 210 04/22/2021   Attestation Statements:   Reviewed by clinician on day of visit: allergies, medications, problem list, medical history, surgical history, family history, social history, and previous encounter notes.  Coral Ceo, CMA, am acting as transcriptionist for Coralie Common, MD.   This is the patient's first visit at [facility]. The patient's  NEW PATIENT PACKET was reviewed at length. Included in the packet: current and past health history, medications, allergies, ROS, gynecologic history (women only), surgical history, family history, social history, weight history, weight loss surgery history (for those that have had weight loss surgery), nutritional evaluation, mood and food questionnaire, PHQ9, Epworth questionnaire, sleep habits questionnaire, patient life and health improvement goals questionnaire. These will all be scanned into the patient's chart under media.   During the visit, I independently reviewed the patient's EKG, bioimpedance scale results, and indirect calorimeter results. I used this information to tailor a meal plan for the patient that will help her to lose weight and will improve her obesity-related conditions going forward. I performed a medically necessary appropriate examination and/or evaluation. I discussed the assessment and treatment plan with the patient. The patient was provided an opportunity to ask questions and all were answered. The patient agreed with the plan  and demonstrated an understanding of the instructions. Labs were ordered at this visit and will be reviewed at the next visit unless more critical results need to be addressed immediately. Clinical information was updated and documented in the EMR.   Time spent on visit including pre-visit chart review and post-visit care was 47 minutes.   A separate 15 minutes was spent on risk counseling (see above).  I have reviewed the above documentation for accuracy and completeness, and I agree with the above. - Coralie Common, MD

## 2021-06-03 ENCOUNTER — Encounter: Payer: Self-pay | Admitting: Emergency Medicine

## 2021-06-03 ENCOUNTER — Other Ambulatory Visit: Payer: Self-pay

## 2021-06-03 ENCOUNTER — Ambulatory Visit
Admission: EM | Admit: 2021-06-03 | Discharge: 2021-06-03 | Disposition: A | Discharge status: Home / Self Care | Payer: Medicare Other

## 2021-06-03 DIAGNOSIS — M5416 Radiculopathy, lumbar region: Secondary | ICD-10-CM | POA: Diagnosis not present

## 2021-06-03 NOTE — Discharge Instructions (Signed)
Please follow-up with provided contact information for spine specialty for further evaluation and management.

## 2021-06-03 NOTE — ED Provider Notes (Signed)
[name]    CSN: [id number] Arrival date & time: 06/03/21  1711      History   Chief Complaint Chief Complaint  Patient presents with   Back Pain    HPI [name] is a 61 y.o. female.   Patient presents with left lower back pain that radiates down left leg that has been present for approximately 4 days.  Patient reports that she has a history of recurrent sciatica that has worsened over the past 4 days.  She is followed by [facility] and has been doing physical therapy.  Last physical therapy was a few days prior and patient reports that she was discharged from physical therapy.  She has been taking Aleve and Tylenol for pain with minimal improvement.  Denies urinary frequency, urinary burning, urinary or bowel incontinence, saddle anesthesia.   Back Pain  Past Medical History:  Diagnosis Date   Allergy    Anginal pain ([place])    a. NL cath in 2008;  b. Myoview 03/2011: dec uptake along mid anterior wall on stress imaging -> ? attenuation vs. ischemia, EF 65%;  c. 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 benign thymoma   Neuromuscular disorder ([place])    Obesity    Palpitations    Pneumonia 05/2016   double   Pulmonary edema    Pulmonary embolism ([place])    a. 2008 -> coumadin x 6 mos.   Rheumatoid arthritis ([place])    Sleep apnea    on CPAP 02/2018   SOB (shortness of breath)    Thyroid disease    TIA (transient  ischemic attack)    Urinary urgency    Vitamin D deficiency     Patient Active Problem List   Diagnosis Date Noted   Back pain 06/01/2021   Right thyroid nodule 05/31/2021   At risk for impaired metabolic function 32/67/1245   Unilateral primary osteoarthritis, left hip    Hip arthritis 06/11/2018   Class 3 severe obesity with serious comorbidity and body mass index (BMI) of 40.0 to 44.9 in adult [facility]) 05/29/2018   Other fatigue 11/20/2017   Shortness of breath on exertion 11/20/2017   Diabetes mellitus ([place]) 11/20/2017   Vitamin D deficiency 11/20/2017   Depression 11/20/2017   Other hyperlipidemia 11/20/2017   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/04/2016   Presence of left artificial knee joint 09/20/2016   Arthritis of knee 08/22/2016   Cervical radiculopathy 07/26/2016   Cervical disc disorder with radiculopathy 07/26/2016   Right arm pain 06/29/2016   Cervicalgia 06/29/2016   Primary osteoarthritis of left knee 06/29/2016   Hypersomnia with sleep apnea 11/18/2015   Lethargy 11/18/2015  Exertional chest pain 09/30/2014   Abnormal cardiac function test 09/29/2014   Chest pain with moderate risk for cardiac etiology 09/28/2014   HTN (hypertension) 09/28/2014   Hypokalemia    Paresthesia 06/18/2014   Numbness and tingling of left arm and leg    Unstable angina ([place]) 05/24/2014   OSA on CPAP 05/24/2014   CAD (coronary artery disease) 11/25/2012   S/P laparoscopic appendectomy 54/27/0623   Diastolic CHF ([place]) 76/28/3151   Mediastinal mass 01/09/2012   SOB (shortness of breath) 06/20/2011   Mediastinal abnormality 06/20/2011   Dyslipidemia 12/23/2009   GLAUCOMA 12/23/2009   ARTHRITIS 12/23/2009   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   Class 2 severe obesity with serious comorbidity and body mass index (BMI) of 38.0 to 38.9 in adult ([place]) 09/13/2006   ANXIETY STATE NOS 09/13/2006   DISORDER, DEPRESSIVE NEC 09/13/2006   CARPAL TUNNEL SYNDROME, MILD 09/13/2006   Unspecified essential  hypertension 09/13/2006   IBS 09/13/2006   DEGENERATION, LUMBAR/LUMBOSACRAL DISC 09/13/2006   SYMPTOM, SWELLING/MASS/LUMP IN CHEST 09/13/2006   PULMONARY EMBOLISM, HX OF 09/13/2006    Past Surgical History:  Procedure Laterality Date   ABDOMINAL HYSTERECTOMY  2005   APPENDECTOMY     CARDIAC CATHETERIZATION     Normal   CARDIAC CATHETERIZATION N/A 09/30/2014   Procedure: Left Heart Cath and Coronary Angiography;  Surgeon: Sherren Mocha, MD;  Location: [facility];  Service: Cardiovascular;  Laterality: N/A;   LAPAROSCOPIC APPENDECTOMY N/A 06/03/2012   Procedure: APPENDECTOMY LAPAROSCOPIC;  Surgeon: Stark Klein, MD;  Location: [facility];  Service: General;  Laterality: N/A;   Left knee surgery  2008   LEG SURGERY     TONSILLECTOMY     TOTAL HIP ARTHROPLASTY Left 06/11/2018   Procedure: LEFT TOTAL HIP ARTHROPLASTY ANTERIOR APPROACH;  Surgeon: Meredith Pel, MD;  Location: [facility];  Service: Orthopedics;  Laterality: Left;   TOTAL KNEE ARTHROPLASTY Left 08/22/2016   Procedure: TOTAL KNEE ARTHROPLASTY;  Surgeon: Meredith Pel, MD;  Location: [facility];  Service: Orthopedics;  Laterality: Left;   TUBAL LIGATION  1989    OB History     Gravida  4   Para  3   Term  3   Preterm  0   AB  1   Living  3      SAB  1   IAB  0   Ectopic  0   Multiple  0   Live Births  3            Home Medications    Prior to Admission medications   Medication Sig Start Date End Date Taking? Authorizing Provider  albuterol (PROVENTIL HFA;VENTOLIN HFA) 108 (90 Base) MCG/ACT inhaler Inhale 2 puffs into the lungs every 6 (six) hours as needed for wheezing or shortness of breath.   Yes [provider]  amLODipine (NORVASC) 5 MG tablet Take 5 mg by mouth daily.   Yes [provider]  aspirin 81 MG chewable tablet Chew 81 mg by mouth daily.   Yes [provider]  atorvastatin (LIPITOR) 40 MG tablet Take 40 mg by mouth at bedtime.    Yes [provider]  busPIRone (BUSPAR) 30 MG tablet Take 30 mg by mouth 2 (two) times daily. 12/18/19  Yes [provider]  carvedilol (COREG) 12.5 MG tablet Take 12.5 mg by mouth daily. 08/11/19  Yes [provider]  diclofenac Sodium (VOLTAREN) 1 % GEL Apply 2 g topically 4 (four) times daily as needed (  joint/muscle pain). 09/20/19  Yes [provider]  furosemide (LASIX) 40 MG tablet Take 60 mg by mouth daily.   Yes Rai, Ripudeep K, MD  hydrOXYzine (VISTARIL) 25 MG capsule Take 25 mg by mouth 3 (three) times daily as needed for itching.   Yes [provider]  isosorbide dinitrate (ISORDIL) 20 MG tablet Take 20 mg by mouth 2 (two) times daily.   Yes [provider]  JENTADUETO XR 2.08-998 MG TB24 Take 1 tablet by mouth daily. 12/13/20  Yes [provider]  lidocaine (LIDODERM) 5 % Place 1 patch onto the skin daily as needed for pain. 02/11/21  Yes [provider]  lisinopril (PRINIVIL,ZESTRIL) 20 MG tablet Take 20 mg by mouth daily.  09/21/14  Yes [provider]  loperamide (IMODIUM A-D) 2 MG tablet Take 4 mg by mouth 4 (four) times daily as needed for diarrhea or loose stools.    Yes [provider]  meloxicam (MOBIC) 15 MG tablet Take 15 mg by mouth daily.   Yes [provider]  MYRBETRIQ 25 MG TB24 tablet Take 1 tablet (25 mg total) by mouth daily. 03/13/19  Yes Beasley, Caren D, MD  nystatin (MYCOSTATIN/NYSTOP) powder Apply 1 application topically 3 (three) times daily as needed for rash.   Yes [provider]  omeprazole (PRILOSEC) 20 MG capsule Take 20 mg by mouth daily.   Yes [provider]  ondansetron (ZOFRAN) 4 MG tablet Take 1 tablet (4 mg total) by mouth every 6 (six) hours. Patient taking differently: Take 4 mg by mouth every 6 (six) hours as needed for nausea or vomiting. 09/03/20  Yes Blanchie Dessert, MD  pantoprazole (PROTONIX) 40 MG tablet Take 1 tablet (40 mg total) by mouth 2  (two) times daily. 03/09/21  Yes Domenic Polite, MD  potassium chloride (KLOR-CON M10) 10 MEQ tablet Take 1 tablet (10 mEq total) by mouth 2 (two) times daily. 05/27/18  Yes Jearld Lesch A, DO  sertraline (ZOLOFT) 25 MG tablet Take 1 tablet (25 mg total) by mouth daily. 06/01/21  Yes Laqueta Linden, MD  tiZANidine (ZANAFLEX) 4 MG tablet Take 4 mg by mouth every 6 (six) hours as needed for muscle spasms.   Yes [provider]  topiramate (TOPAMAX) 25 MG tablet Take 1 tablet (25 mg total) by mouth at bedtime. 04/12/20  Yes Opalski, Neoma Laming, DO  traZODone (DESYREL) 50 MG tablet Take 50 mg by mouth at bedtime.   Yes [provider]    Family History Family History  Problem Relation Age of Onset   Emphysema Mother    Arthritis Mother    Heart failure Mother        alive @ 66   Stroke Mother    Diabetes Mother    Hypertension Mother    Hyperlipidemia Mother    Depression Mother    Anxiety disorder Mother    Heart disease Mother    Alcoholism Mother    Depression Father    Heart disease Father        died @ 45's.   Stroke Father    Diabetes Father    Hyperlipidemia Father    Hypertension Father    Bipolar disorder Father    Sleep apnea Father    Alcoholism Father    Drug abuse Father    Sudden death Father    Diabetes Sister    Asthma Brother    Colon cancer Maternal Grandfather    Heart disease Paternal Grandfather  Breast cancer Maternal Aunt    Esophageal cancer Neg Hx    Stomach cancer Neg Hx    Rectal cancer Neg Hx     Social History Social History   Tobacco Use   Smoking status: Former    Packs/day: 0.50    Years: 15.00    Pack years: 7.50    Types: Cigarettes    Quit date: 04/24/1985    Years since quitting: 36.1   Smokeless tobacco: Never  Vaping Use   Vaping Use: Never used  Substance Use Topics   Alcohol use: No    Alcohol/week: 0.0 standard drinks   Drug use: Not Currently    Types: Marijuana     Allergies   Penicillins,  Hydrocodone-acetaminophen, Latex, and Sulfa antibiotics   Review of Systems Review of Systems Per HPI  Physical Exam Triage Vital Signs ED Triage Vitals  Enc Vitals Group     BP 06/03/21 1801 (!) 155/85     Pulse Rate 06/03/21 1801 73     Resp 06/03/21 1801 20     Temp 06/03/21 1801 98.6 F (37 C)     Temp Source 06/03/21 1801 Oral     SpO2 06/03/21 1801 96 %     Weight 06/03/21 1803 209 lb (94.8 kg)     Height 06/03/21 1803 4\' 11"  (1.499 m)     Head Circumference --      Peak Flow --      Pain Score 06/03/21 1803 10     Pain Loc --      Pain Edu? --      Excl. in [place]? --    No data found.  Updated Vital Signs BP (!) 155/85 (BP Location: Left Arm)    Pulse 73    Temp 98.6 F (37 C) (Oral)    Resp 20    Ht 4\' 11"  (1.499 m)    Wt 209 lb (94.8 kg)    SpO2 96%    BMI 42.21 kg/m   Visual Acuity Right Eye Distance:   Left Eye Distance:   Bilateral Distance:    Right Eye Near:   Left Eye Near:    Bilateral Near:     Physical Exam Constitutional:      General: She is not in acute distress.    Appearance: Normal appearance. She is not toxic-appearing or diaphoretic.  HENT:     Head: Normocephalic and atraumatic.  Eyes:     Extraocular Movements: Extraocular movements intact.     Conjunctiva/sclera: Conjunctivae normal.  Pulmonary:     Effort: Pulmonary effort is normal.  Musculoskeletal:     Cervical back: Normal.     Thoracic back: Normal.     Lumbar back: Tenderness present. No swelling, edema or bony tenderness.       Back:     Comments: Tenderness to palpation to left lower lumbar region.  No direct spinal tenderness, crepitus, step-off.  Unable to perform straight leg raise as patient is not able to cooperate to pain.  Neurological:     General: No focal deficit present.     Mental Status: She is alert and oriented to person, place, and time. Mental status is at baseline.     Deep Tendon Reflexes: Reflexes are normal and symmetric.  Psychiatric:         Mood and Affect: Mood normal.        Behavior: Behavior normal.        Thought Content: Thought content normal.  Judgment: Judgment normal.     UC Treatments / Results  Labs (all labs ordered are listed, but only abnormal results are displayed) Labs Reviewed - No data to display  EKG   Radiology No results found.  Procedures Procedures (including critical care time)  Medications Ordered in UC Medications - No data to display  Initial Impression / Assessment and Plan / UC Course  I have reviewed the triage vital signs and the nursing notes.  Pertinent labs & imaging results that were available during my care of the patient were reviewed by me and considered in my medical decision making (see chart for details).     Patient had lumbar spine x-ray on 04/29/2021 that showed degenerative changes of the lower lumbar spine.  This is consistent with patient's lumbar radiculopathy.  Limited options on pain management given patient's recent creatinine and history of glaucoma and diabetes.  Anti-inflammatory medications are not an option given these comorbidities.  Advised patient that she may take Tylenol as long as she has not been told that she can't take it as well as using ice and heat application.  Patient to follow-up with [facility] or provided contact information for spine specialty for further evaluation and management.  Discussed return precautions.  Patient verbalized understanding and was agreeable with plan. Final Clinical Impressions(s) / UC Diagnoses   Final diagnoses:  Lumbar radiculopathy     Discharge Instructions      Please follow-up with provided contact information for spine specialty for further evaluation and management.    ED Prescriptions   None    PDMP not reviewed this encounter.   Teodora Medici, [place] 06/03/21 [phone]

## 2021-06-03 NOTE — ED Triage Notes (Signed)
Patient states she's having low back pain that radiates down her legs x 4 days.  Patient does have a history of sciatica.  No injury.  Hip and knee replacements 3-4 years ago.  Patient has been taken Aleve and in physical therapy.

## 2021-06-09 ENCOUNTER — Ambulatory Visit
Admission: RE | Admit: 2021-06-09 | Discharge: 2021-06-09 | Disposition: A | Discharge status: Home / Self Care | Payer: Medicare Other | Source: Ambulatory Visit | Attending: Internal Medicine | Admitting: Internal Medicine

## 2021-06-09 DIAGNOSIS — E041 Nontoxic single thyroid nodule: Secondary | ICD-10-CM

## 2021-06-10 ENCOUNTER — Encounter: Payer: Self-pay | Admitting: Internal Medicine

## 2021-06-10 ENCOUNTER — Ambulatory Visit (AMBULATORY_SURGERY_CENTER): Payer: Medicare Other | Admitting: Internal Medicine

## 2021-06-10 VITALS — BP 134/81 | HR 69 | Temp 96.8°F | Resp 13 | Ht 59.0 in | Wt 209.0 lb

## 2021-06-10 DIAGNOSIS — D123 Benign neoplasm of transverse colon: Secondary | ICD-10-CM | POA: Diagnosis not present

## 2021-06-10 DIAGNOSIS — Z1211 Encounter for screening for malignant neoplasm of colon: Secondary | ICD-10-CM | POA: Diagnosis not present

## 2021-06-10 DIAGNOSIS — D124 Benign neoplasm of descending colon: Secondary | ICD-10-CM | POA: Diagnosis not present

## 2021-06-10 DIAGNOSIS — K621 Rectal polyp: Secondary | ICD-10-CM

## 2021-06-10 DIAGNOSIS — D128 Benign neoplasm of rectum: Secondary | ICD-10-CM

## 2021-06-10 MED ORDER — SODIUM CHLORIDE 0.9 % IV SOLN: 500.0000 mL | INTRAVENOUS | Status: DC

## 2021-06-10 NOTE — Op Note (Signed)
[facility] Patient Name: [name] Procedure Date: 06/10/2021 11:10 AM MRN: [id number] Endoscopist: Jerene Bears , MD Age: 61 Referring MD:  Date of Birth: 1960-04-27 Gender: Female Account #: [id number] Procedure:                Colonoscopy Indications:              Screening for colorectal malignant neoplasm, Last                            colonoscopy 10 years ago Medicines:                Monitored Anesthesia Care Procedure:                Pre-Anesthesia Assessment:                           - Prior to the procedure, a History and Physical                            was performed, and patient medications and                            allergies were reviewed. The patient's tolerance of                            previous anesthesia was also reviewed. The risks                            and benefits of the procedure and the sedation                            options and risks were discussed with the patient.                            All questions were answered, and informed consent                            was obtained. Prior Anticoagulants: The patient has                            taken no previous anticoagulant or antiplatelet                            agents. ASA Grade Assessment: III - A patient with                            severe systemic disease. After reviewing the risks                            and benefits, the patient was deemed in                            satisfactory condition to undergo the procedure.  After obtaining informed consent, the colonoscope                            was passed under direct vision. Throughout the                            procedure, the patient's blood pressure, pulse, and                            oxygen saturations were monitored continuously. The                            Olympus CF-HQ190L [id number]) Colonoscope was                            introduced through the anus and  advanced to the                            cecum, identified by appendiceal orifice and                            ileocecal valve. The colonoscopy was performed                            without difficulty. The patient tolerated the                            procedure well. The quality of the bowel                            preparation was good. The ileocecal valve,                            appendiceal orifice, and rectum were photographed. Scope In: 11:32:12 AM Scope Out: 74:25:95 AM Scope Withdrawal Time: 0 hours 12 minutes 4 seconds  Total Procedure Duration: 0 hours 14 minutes 26 seconds  Findings:                 The digital rectal exam was normal.                           A 3 mm polyp was found in the transverse colon. The                            polyp was sessile. The polyp was removed with a                            cold snare. Resection and retrieval were complete.                           A 4 mm polyp was found in the descending colon. The                            polyp was sessile. The  polyp was removed with a                            cold snare. Resection and retrieval were complete.                           A 6 mm polyp was found in the rectum. The polyp was                            semi-pedunculated. The polyp was removed with a                            cold snare. Resection and retrieval were complete.                           A few small-mouthed diverticula were found in the                            sigmoid colon and descending colon.                           The retroflexed view of the distal rectum and anal                            verge was normal and showed no anal or rectal                            abnormalities. Complications:            No immediate complications. Estimated Blood Loss:     Estimated blood loss was minimal. Impression:               - One 3 mm polyp in the transverse colon, removed                            with a cold  snare. Resected and retrieved.                           - One 4 mm polyp in the descending colon, removed                            with a cold snare. Resected and retrieved.                           - One 6 mm polyp in the rectum, removed with a cold                            snare. Resected and retrieved.                           - Diverticulosis in the sigmoid colon and in the                            descending colon.                           -  The distal rectum and anal verge are normal on                            retroflexion view. Recommendation:           - Patient has a contact number available for                            emergencies. The signs and symptoms of potential                            delayed complications were discussed with the                            patient. Return to normal activities tomorrow.                            Written discharge instructions were provided to the                            patient.                           - Resume previous diet.                           - Continue present medications.                           - Await pathology results.                           - Repeat colonoscopy is recommended. The                            colonoscopy date will be determined after pathology                            results from today's exam become available for                            review. Jerene Bears, MD 06/10/2021 11:48:26 AM This report has been signed electronically.

## 2021-06-10 NOTE — Patient Instructions (Signed)
Handout on polyps and diverticulosis given    YOU HAD AN ENDOSCOPIC PROCEDURE TODAY AT THE [facility]:   Refer to the procedure report that was given to you for any specific questions about what was found during the examination.  If the procedure report does not answer your questions, please call your gastroenterologist to clarify.  If you requested that your care partner not be given the details of your procedure findings, then the procedure report has been included in a sealed envelope for you to review at your convenience later.  YOU SHOULD EXPECT: Some feelings of bloating in the abdomen. Passage of more gas than usual.  Walking can help get rid of the air that was put into your GI tract during the procedure and reduce the bloating. If you had a lower endoscopy (such as a colonoscopy or flexible sigmoidoscopy) you may notice spotting of blood in your stool or on the toilet paper. If you underwent a bowel prep for your procedure, you may not have a normal bowel movement for a few days.  Please Note:  You might notice some irritation and congestion in your nose or some drainage.  This is from the oxygen used during your procedure.  There is no need for concern and it should clear up in a day or so.  SYMPTOMS TO REPORT IMMEDIATELY:   Following lower endoscopy (colonoscopy or flexible sigmoidoscopy):  Excessive amounts of blood in the stool  Significant tenderness or worsening of abdominal pains  Swelling of the abdomen that is new, acute  Fever of 100F or higher   For urgent or emergent issues, a gastroenterologist can be reached at any hour by calling [phone]. Do not use MyChart messaging for urgent concerns.    DIET:  We do recommend a small meal at first, but then you may proceed to your regular diet.  Drink plenty of fluids but you should avoid alcoholic beverages for 24 hours.  ACTIVITY:  You should plan to take it easy for the rest of today and you should NOT  DRIVE or use heavy machinery until tomorrow (because of the sedation medicines used during the test).    FOLLOW UP: Our staff will call the number listed on your records 48-72 hours following your procedure to check on you and address any questions or concerns that you may have regarding the information given to you following your procedure. If we do not reach you, we will leave a message.  We will attempt to reach you two times.  During this call, we will ask if you have developed any symptoms of COVID 19. If you develop any symptoms (ie: fever, flu-like symptoms, shortness of breath, cough etc.) before then, please call [phone].  If you test positive for Covid 19 in the 2 weeks post procedure, please call and report this information to us.    If any biopsies were taken you will be contacted by phone or by letter within the next 1-3 weeks.  Please call us at [phone] if you have not heard about the biopsies in 3 weeks.    SIGNATURES/CONFIDENTIALITY: You and/or your care partner have signed paperwork which will be entered into your electronic medical record.  These signatures attest to the fact that that the information above on your After Visit Summary has been reviewed and is understood.  Full responsibility of the confidentiality of this discharge information lies with you and/or your care-partner. 

## 2021-06-10 NOTE — Progress Notes (Signed)
Pt's states no medical or surgical changes since previsit or office visit. 

## 2021-06-10 NOTE — Progress Notes (Signed)
Called to room to assist during endoscopic procedure.  Patient ID and intended procedure confirmed with present staff. Received instructions for my participation in the procedure from the performing physician.  

## 2021-06-10 NOTE — Progress Notes (Signed)
GASTROENTEROLOGY PROCEDURE H&P NOTE   Primary Care Physician: Sonia Side., FNP    Reason for Procedure:  Colon cancer screening  Plan:    Colonoscopy  Patient is appropriate for endoscopic procedure(s) in the ambulatory ([facility]) setting.  The nature of the procedure, as well as the risks, benefits, and alternatives were carefully and thoroughly reviewed with the patient. Ample time for discussion and questions allowed. The patient understood, was satisfied, and agreed to proceed.     HPI: [name] is a 61 y.o. female who presents for colonoscopy.  Medical history as below.  Tolerated the prep.  No recent chest pain or shortness of breath.  No abdominal pain today.  Past Medical History:  Diagnosis Date   Allergy    Anginal pain ([place])    a. NL cath in 2008;  b. Myoview 03/2011: dec uptake along mid anterior wall on stress imaging -> ? attenuation vs. ischemia, EF 65%;  c. 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 2008 -> coumadin x 6 mos.   Rheumatoid arthritis ([place])    Sleep apnea    on CPAP 02/2018   SOB (shortness of breath)    Thyroid disease    TIA (transient ischemic attack)    Urinary urgency    Vitamin D deficiency     Past Surgical History:  Procedure  Laterality Date   ABDOMINAL HYSTERECTOMY  2005   APPENDECTOMY     CARDIAC CATHETERIZATION     Normal   CARDIAC CATHETERIZATION N/A 09/30/2014   Procedure: Left Heart Cath and Coronary Angiography;  Surgeon: Sherren Mocha, MD;  Location: [facility];  Service: Cardiovascular;  Laterality: N/A;   LAPAROSCOPIC APPENDECTOMY N/A 06/03/2012   Procedure: APPENDECTOMY LAPAROSCOPIC;  Surgeon: Stark Klein, MD;  Location: [facility];  Service: General;  Laterality: N/A;   Left knee surgery  2008   LEG SURGERY     TONSILLECTOMY     TOTAL HIP ARTHROPLASTY Left 06/11/2018   Procedure: LEFT TOTAL HIP ARTHROPLASTY ANTERIOR APPROACH;  Surgeon: Meredith Pel, MD;  Location: [facility];  Service: Orthopedics;  Laterality: Left;   TOTAL KNEE ARTHROPLASTY Left 08/22/2016   Procedure: TOTAL KNEE ARTHROPLASTY;  Surgeon: Meredith Pel, MD;  Location: [facility];  Service: Orthopedics;  Laterality: Left;   TUBAL LIGATION  1989    Prior to Admission medications   Medication Sig Start Date End Date Taking? Authorizing Provider  amLODipine (NORVASC)  5 MG tablet Take 5 mg by mouth daily.   Yes [provider]  aspirin 81 MG chewable tablet Chew 81 mg by mouth daily.   Yes [provider]  atorvastatin (LIPITOR) 40 MG tablet Take 40 mg by mouth at bedtime.    Yes [provider]  busPIRone (BUSPAR) 30 MG tablet Take 30 mg by mouth 2 (two) times daily. 12/18/19  Yes [provider]  carvedilol (COREG) 12.5 MG tablet Take 12.5 mg by mouth daily. 08/11/19  Yes [provider]  diclofenac Sodium (VOLTAREN) 1 % GEL Apply 2 g topically 4 (four) times daily as needed (joint/muscle pain). 09/20/19  Yes [provider]  furosemide (LASIX) 40 MG tablet Take 60 mg by mouth daily.   Yes Rai, Ripudeep K, MD  isosorbide dinitrate (ISORDIL) 20 MG tablet Take 20 mg by mouth 2 (two) times daily.   Yes [provider]  JENTADUETO XR 2.08-998 MG TB24 Take 1 tablet by mouth  daily. 12/13/20  Yes [provider]  lisinopril (PRINIVIL,ZESTRIL) 20 MG tablet Take 20 mg by mouth daily.  09/21/14  Yes [provider]  meloxicam (MOBIC) 15 MG tablet meloxicam 15 mg tablet  TAKE 1 TABLET (15 MG) BY ORAL ROUTE ONCE DAILY FOR 90 DAYS   Yes [provider]  MYRBETRIQ 25 MG TB24 tablet Take 1 tablet (25 mg total) by mouth daily. 03/13/19  Yes Beasley, Caren D, MD  omeprazole (PRILOSEC) 20 MG capsule Take 20 mg by mouth daily.   Yes [provider]  pantoprazole (PROTONIX) 40 MG tablet Take 1 tablet (40 mg total) by mouth 2 (two) times daily. 03/09/21  Yes Domenic Polite, MD  potassium chloride (KLOR-CON M10) 10 MEQ tablet Take 1 tablet (10 mEq total) by mouth 2 (two) times daily. 05/27/18  Yes Jearld Lesch A, DO  sertraline (ZOLOFT) 25 MG tablet Take 1 tablet (25 mg total) by mouth daily. 06/01/21  Yes Laqueta Linden, MD  SitaGLIPtin-MetFORMIN HCl (JANUMET XR) 50-500 MG TB24 Janumet XR 50 mg-500 mg tablet,extended release  TAKE 1 TABLET BY MOUTH EVERY DAY   Yes [provider]  albuterol (PROVENTIL HFA;VENTOLIN HFA) 108 (90 Base) MCG/ACT inhaler Inhale 2 puffs into the lungs every 6 (six) hours as needed for wheezing or shortness of breath.    [provider]  hydrOXYzine (VISTARIL) 25 MG capsule Take 25 mg by mouth 3 (three) times daily as needed for itching.    [provider]  lidocaine (LIDODERM) 5 % Place 1 patch onto the skin daily as needed for pain. 02/11/21   [provider]  loperamide (IMODIUM A-D) 2 MG tablet Take 4 mg by mouth 4 (four) times daily as needed for diarrhea or loose stools.     [provider]  nystatin (MYCOSTATIN/NYSTOP) powder Apply 1 application topically 3 (three) times daily as needed for rash.    [provider]  ondansetron (ZOFRAN) 4 MG tablet Take 1 tablet (4 mg total) by mouth every 6 (six) hours. Patient taking differently: Take 4 mg by mouth every 6  (six) hours as needed for nausea or vomiting. 09/03/20   Blanchie Dessert, MD  tiZANidine (ZANAFLEX) 4 MG tablet tizanidine 4 mg tablet  TAKE 1 TABLET (4 MG) BY ORAL ROUTE EVERY 8 HOURS AS NEEDED FOR MUSCLE SPASM FOR 14 DAYS    [provider]  topiramate (TOPAMAX) 25 MG tablet Take 1 tablet (25 mg total) by mouth at bedtime. Patient not taking: Reported  on 06/10/2021 04/12/20   Mellody Dance, DO  traZODone (DESYREL) 50 MG tablet Take 50 mg by mouth at bedtime. Patient not taking: Reported on 06/10/2021    [provider]    Current Outpatient Medications  Medication Sig Dispense Refill   amLODipine (NORVASC) 5 MG tablet Take 5 mg by mouth daily.     aspirin 81 MG chewable tablet Chew 81 mg by mouth daily.     atorvastatin (LIPITOR) 40 MG tablet Take 40 mg by mouth at bedtime.      busPIRone (BUSPAR) 30 MG tablet Take 30 mg by mouth 2 (two) times daily.     carvedilol (COREG) 12.5 MG tablet Take 12.5 mg by mouth daily.     diclofenac Sodium (VOLTAREN) 1 % GEL Apply 2 g topically 4 (four) times daily as needed (joint/muscle pain).     furosemide (LASIX) 40 MG tablet Take 60 mg by mouth daily.     isosorbide dinitrate (ISORDIL) 20 MG tablet Take 20 mg by mouth 2 (two) times daily.     JENTADUETO XR 2.08-998 MG TB24 Take 1 tablet by mouth daily.     lisinopril (PRINIVIL,ZESTRIL) 20 MG tablet Take 20 mg by mouth daily.      meloxicam (MOBIC) 15 MG tablet meloxicam 15 mg tablet  TAKE 1 TABLET (15 MG) BY ORAL ROUTE ONCE DAILY FOR 90 DAYS     MYRBETRIQ 25 MG TB24 tablet Take 1 tablet (25 mg total) by mouth daily. 30 tablet 0   omeprazole (PRILOSEC) 20 MG capsule Take 20 mg by mouth daily.     pantoprazole (PROTONIX) 40 MG tablet Take 1 tablet (40 mg total) by mouth 2 (two) times daily. 60 tablet 0   potassium chloride (KLOR-CON M10) 10 MEQ tablet Take 1 tablet (10 mEq total) by mouth 2 (two) times daily. 60 tablet 1   sertraline (ZOLOFT) 25 MG tablet Take 1 tablet (25 mg total)  by mouth daily. 30 tablet 0   SitaGLIPtin-MetFORMIN HCl (JANUMET XR) 50-500 MG TB24 Janumet XR 50 mg-500 mg tablet,extended release  TAKE 1 TABLET BY MOUTH EVERY DAY     albuterol (PROVENTIL HFA;VENTOLIN HFA) 108 (90 Base) MCG/ACT inhaler Inhale 2 puffs into the lungs every 6 (six) hours as needed for wheezing or shortness of breath.     hydrOXYzine (VISTARIL) 25 MG capsule Take 25 mg by mouth 3 (three) times daily as needed for itching.     lidocaine (LIDODERM) 5 % Place 1 patch onto the skin daily as needed for pain.     loperamide (IMODIUM A-D) 2 MG tablet Take 4 mg by mouth 4 (four) times daily as needed for diarrhea or loose stools.      nystatin (MYCOSTATIN/NYSTOP) powder Apply 1 application topically 3 (three) times daily as needed for rash.     ondansetron (ZOFRAN) 4 MG tablet Take 1 tablet (4 mg total) by mouth every 6 (six) hours. (Patient taking differently: Take 4 mg by mouth every 6 (six) hours as needed for nausea or vomiting.) 12 tablet 0   tiZANidine (ZANAFLEX) 4 MG tablet tizanidine 4 mg tablet  TAKE 1 TABLET (4 MG) BY ORAL ROUTE EVERY 8 HOURS AS NEEDED FOR MUSCLE SPASM FOR 14 DAYS     topiramate (TOPAMAX) 25 MG tablet Take 1 tablet (25 mg total) by mouth at bedtime. (Patient not taking: Reported on 06/10/2021) 30 tablet 0   traZODone (DESYREL) 50 MG tablet Take 50 mg by mouth at bedtime. (Patient not taking: Reported on  06/10/2021)     Current Facility-Administered Medications  Medication Dose Route Frequency Provider Last Rate Last Admin   0.9 %  sodium chloride infusion  500 mL Intravenous Continuous Aldric Wenzler, Lajuan Lines, MD        Allergies as of 06/10/2021 - Review Complete 06/10/2021  Allergen Reaction Noted   Penicillins Swelling and Other (See Comments)    Hydrocodone-acetaminophen Itching 03/31/2013   Latex Rash 08/22/2016   Sulfa antibiotics Diarrhea, Itching, and Rash 10/07/2011    Family History  Problem Relation Age of Onset   Emphysema Mother    Arthritis Mother     Heart failure Mother        alive @ 58   Stroke Mother    Diabetes Mother    Hypertension Mother    Hyperlipidemia Mother    Depression Mother    Anxiety disorder Mother    Heart disease Mother    Alcoholism Mother    Depression Father    Heart disease Father        died @ 28's.   Stroke Father    Diabetes Father    Hyperlipidemia Father    Hypertension Father    Bipolar disorder Father    Sleep apnea Father    Alcoholism Father    Drug abuse Father    Sudden death Father    Diabetes Sister    Asthma Brother    Colon cancer Maternal Grandfather    Heart disease Paternal Grandfather    Breast cancer Maternal Aunt    Esophageal cancer Neg Hx    Stomach cancer Neg Hx    Rectal cancer Neg Hx     Social History   Socioeconomic History   Marital status: Divorced    Spouse name: Not on file   Number of children: 3   Years of education: Not on file   Highest education level: Not on file  Occupational History   Occupation: UNEMPLOYED    Employer: DISABLED  Tobacco Use   Smoking status: Former    Packs/day: 0.50    Years: 15.00    Pack years: 7.50    Types: Cigarettes    Quit date: 04/24/1985    Years since quitting: 36.1   Smokeless tobacco: Never  Vaping Use   Vaping Use: Never used  Substance and Sexual Activity   Alcohol use: No    Alcohol/week: 0.0 standard drinks   Drug use: Not Currently    Types: Marijuana   Sexual activity: Never  Other Topics Concern   Not on file  Social History Narrative   Lives in [place] by herself.  Disabled.  Caffeine 1 cup avg daily.  3 kids, 6 grandkids.     Water aerobics 3 times a week.   Social Determinants of Health   Financial Resource Strain: Not on file  Food Insecurity: Not on file  Transportation Needs: Not on file  Physical Activity: Not on file  Stress: Not on file  Social Connections: Not on file  Intimate Partner Violence: Not on file    Physical Exam: Vital signs in last 24 hours: @BP  (!) 119/52     Pulse 68    Temp (!) 96.8 F (36 C) (Temporal)    Ht 4\' 11"  (1.499 m)    Wt 209 lb (94.8 kg)    SpO2 100%    BMI 42.21 kg/m  GEN: NAD EYE: Sclerae anicteric ENT: MMM CV: Non-tachycardic Pulm: CTA b/l GI: Soft, NT/ND NEURO:  Alert & Oriented x 3  Zenovia Jarred, MD [facility]  06/10/2021 11:13 AM

## 2021-06-10 NOTE — Progress Notes (Signed)
To pacu, VSS. Report to [email or web address] °

## 2021-06-15 ENCOUNTER — Telehealth: Payer: Self-pay

## 2021-06-15 ENCOUNTER — Encounter (INDEPENDENT_AMBULATORY_CARE_PROVIDER_SITE_OTHER): Payer: Self-pay | Admitting: Family Medicine

## 2021-06-15 ENCOUNTER — Ambulatory Visit (INDEPENDENT_AMBULATORY_CARE_PROVIDER_SITE_OTHER): Payer: Medicare Other | Admitting: Family Medicine

## 2021-06-15 ENCOUNTER — Other Ambulatory Visit: Payer: Self-pay

## 2021-06-15 ENCOUNTER — Encounter: Payer: Self-pay | Admitting: Internal Medicine

## 2021-06-15 VITALS — BP 131/77 | HR 70 | Temp 98.0°F | Ht 59.0 in | Wt 204.0 lb

## 2021-06-15 DIAGNOSIS — E1165 Type 2 diabetes mellitus with hyperglycemia: Secondary | ICD-10-CM

## 2021-06-15 DIAGNOSIS — E785 Hyperlipidemia, unspecified: Secondary | ICD-10-CM

## 2021-06-15 DIAGNOSIS — I152 Hypertension secondary to endocrine disorders: Secondary | ICD-10-CM

## 2021-06-15 DIAGNOSIS — E1169 Type 2 diabetes mellitus with other specified complication: Secondary | ICD-10-CM

## 2021-06-15 DIAGNOSIS — E559 Vitamin D deficiency, unspecified: Secondary | ICD-10-CM

## 2021-06-15 DIAGNOSIS — E669 Obesity, unspecified: Secondary | ICD-10-CM

## 2021-06-15 DIAGNOSIS — F32A Depression, unspecified: Secondary | ICD-10-CM

## 2021-06-15 DIAGNOSIS — F419 Anxiety disorder, unspecified: Secondary | ICD-10-CM

## 2021-06-15 DIAGNOSIS — Z6841 Body Mass Index (BMI) 40.0 and over, adult: Secondary | ICD-10-CM

## 2021-06-15 DIAGNOSIS — E1159 Type 2 diabetes mellitus with other circulatory complications: Secondary | ICD-10-CM

## 2021-06-15 MED ORDER — METFORMIN HCL 500 MG PO TABS: 500.0000 mg | ORAL_TABLET | Freq: Every day | ORAL | 0 refills | Status: DC

## 2021-06-15 MED ORDER — OZEMPIC (0.25 OR 0.5 MG/DOSE) 2 MG/1.5ML ~~LOC~~ SOPN: 0.2500 mg | PEN_INJECTOR | SUBCUTANEOUS | 0 refills | Status: DC

## 2021-06-15 MED ORDER — VITAMIN D (ERGOCALCIFEROL) 1.25 MG (50000 UNIT) PO CAPS: 50000.0000 [IU] | ORAL_CAPSULE | ORAL | 0 refills | Status: DC

## 2021-06-15 MED ORDER — SERTRALINE HCL 25 MG PO TABS: 25.0000 mg | ORAL_TABLET | Freq: Every day | ORAL | 0 refills | Status: DC

## 2021-06-15 NOTE — Telephone Encounter (Signed)
°  Follow up Call-  Call back number 06/10/2021  Post procedure Call Back phone  # [phone]  Permission to leave phone message Yes  Some recent data might be hidden     Patient questions:  Do you have a fever, pain , or abdominal swelling? No. Pain Score  0 *  Have you tolerated food without any problems? Yes.    Have you been able to return to your normal activities? Yes.    Do you have any questions about your discharge instructions: Diet   No. Medications  No. Follow up visit  No.  Do you have questions or concerns about your Care? No.  Actions: * If pain score is 4 or above: No action needed, pain <4.

## 2021-06-16 NOTE — Progress Notes (Signed)
Chief Complaint:   OBESITY [name] is here to discuss her progress with her obesity treatment plan along with follow-up of her obesity related diagnoses. [name] is on the Category 2 Plan and the [place] and states she is following her eating plan approximately 75% of the time. [name] states she is walking 20 minutes 2 times per week.  Today's visit was #: 2 Starting weight: 209 lbs Starting date: 06/01/2021 Today's weight: 204 lbs Today's date: 06/15/2021 Total lbs lost to date: 5 Total lbs lost since last in-office visit: 5  Interim History: Pt felt the meal plan worked out ok. She felt some hunger but not repeated in any pattern. She is trying to eat more and get meals in 3 times a day. Quantity is an issue. Pt made some substitutions of eggs and [place] sausage. She only tried Pescatarian 1 day and liked the flexibility of category 2.  Subjective:   1. Type 2 diabetes mellitus with hyperglycemia, without long-term current use of insulin (HCC) [name]'s last A1c was 8.4 with an insulin level of 26.8. She notes some increase in PO intake since starting the program.  2. Vitamin D deficiency Pt's last Vit D level was 30.0. She is not on Vit D supplementation and notes fatigue.  3. Hyperlipidemia associated with type 2 diabetes mellitus ([place]) [name] has an LDL of 87, HDL 49, and triglycerides 182. She is on Lipitor 40 mg.   The 10-year ASCVD risk score (Arnett DK, et al., 2019) is: 15.9%   Values used to calculate the score:     Age: 61 years     Sex: Female     Is Non-Hispanic African American: Yes     Diabetic: Yes     Tobacco smoker: No     Systolic Blood Pressure: 195 mmHg     Is BP treated: Yes     HDL Cholesterol: 49 mg/dL     Total Cholesterol: 167 mg/dL  4. Hypertension associated with diabetes ([place]) BP controlled today. [name] denies chest pain/chest pressure/headache.  5. Anxiety and depression [name] started sertraline and is feeling well and better  since starting it.  Assessment/Plan:   1. Type 2 diabetes mellitus with hyperglycemia, without long-term current use of insulin (HCC) Good blood sugar control is important to decrease the likelihood of diabetic complications such as nephropathy, neuropathy, limb loss, blindness, coronary artery disease, and death. Intensive lifestyle modification including diet, exercise and weight loss are the first line of treatment for diabetes. Stop Jentadueto XR and start Ozempic 0.25 mg.  Start- Semaglutide,0.25 or 0.5MG /DOS, (OZEMPIC, 0.25 OR 0.5 MG/DOSE,) 2 MG/1.5ML SOPN; Inject 0.25 mg into the skin once a week.  Dispense: 1.5 mL; Refill: 0 Start- metFORMIN (GLUCOPHAGE) 500 MG tablet; Take 1 tablet (500 mg total) by mouth daily with breakfast.  Dispense: 30 tablet; Refill: 0  2. Vitamin D deficiency Low Vitamin D level contributes to fatigue and are associated with obesity, breast, and colon cancer. She agrees to start to take prescription Vitamin D 50,000 IU every week and will follow-up for routine testing of Vitamin D, at least 2-3 times per year to avoid over-replacement.  Start- Vitamin D, Ergocalciferol, (DRISDOL) 1.25 MG (50000 UNIT) CAPS capsule; Take 1 capsule (50,000 Units total) by mouth every 7 (seven) days.  Dispense: 4 capsule; Refill: 0  3. Hyperlipidemia associated with type 2 diabetes mellitus ([place]) Cardiovascular risk and specific lipid/LDL goals reviewed.  We discussed several lifestyle modifications today and [name] will continue to  work on diet, exercise and weight loss efforts. Orders and follow up as documented in patient record. Continue current treatment plan.  Counseling Intensive lifestyle modifications are the first line treatment for this issue. Dietary changes: Increase soluble fiber. Decrease simple carbohydrates. Exercise changes: Moderate to vigorous-intensity aerobic activity 150 minutes per week if tolerated. Lipid-lowering medications: see documented in medical  record.  4. Hypertension associated with diabetes ([place]) [name] is working on healthy weight loss and exercise to improve blood pressure control. We will watch for signs of hypotension as she continues her lifestyle modifications.Continue current treatment plan.  5. Anxiety and depression Behavior modification techniques were discussed today to help [name] deal with her anxiety.  Orders and follow up as documented in patient record. Continue sertraline and may increase to 50 mg if needed.  Refill- sertraline (ZOLOFT) 25 MG tablet; Take 1 tablet (25 mg total) by mouth daily.  Dispense: 90 tablet; Refill: 0  6. Obesity with current BMI of 41.3 [name] is currently in the action stage of change. As such, her goal is to continue with weight loss efforts. She has agreed to the Category 2 Plan.   Exercise goals: All adults should avoid inactivity. Some physical activity is better than none, and adults who participate in any amount of physical activity gain some health benefits.  Behavioral modification strategies: increasing lean protein intake, meal planning and cooking strategies, keeping healthy foods in the home, and planning for success.  [name] has agreed to follow-up with our clinic in 2-3 weeks. She was informed of the importance of frequent follow-up visits to maximize her success with intensive lifestyle modifications for her multiple health conditions.   Objective:   Blood pressure 131/77, pulse 70, temperature 98 F (36.7 C), height 4\' 11"  (1.499 m), weight 204 lb (92.5 kg), SpO2 99 %. Body mass index is 41.2 kg/m.  General: Cooperative, alert, well developed, in no acute distress. HEENT: Conjunctivae and lids unremarkable. Cardiovascular: Regular rhythm.  Lungs: Normal work of breathing. Neurologic: No focal deficits.   Lab Results  Component Value Date   CREATININE 1.05 (H) 04/22/2021   BUN 17 04/22/2021   NA 142 04/22/2021   K 3.3 (L) 04/22/2021   CL 105 04/22/2021   CO2  30 04/22/2021   Lab Results  Component Value Date   ALT 31 03/09/2021   AST 18 03/09/2021   ALKPHOS 49 03/09/2021   BILITOT 0.6 03/09/2021   Lab Results  Component Value Date   HGBA1C 8.4 (H) 03/08/2021   HGBA1C 7.6 (H) 02/03/2020   HGBA1C 7.5 (H) 10/07/2019   HGBA1C 6.3 (H) 12/26/2018   HGBA1C 6.6 (H) 05/22/2018   Lab Results  Component Value Date   INSULIN 26.8 (H) 06/01/2021   INSULIN 44.4 (H) 02/03/2020   INSULIN 28.1 (H) 12/26/2018   INSULIN 20.2 03/04/2018   INSULIN 53.9 (H) 11/20/2017   Lab Results  Component Value Date   TSH 0.506 04/23/2021   Lab Results  Component Value Date   CHOL 167 06/01/2021   HDL 49 06/01/2021   LDLCALC 87 06/01/2021   TRIG 182 (H) 06/01/2021   CHOLHDL 4.2 04/23/2011   CHOLHDL 4.3 04/23/2011   Lab Results  Component Value Date   VD25OH 30.0 06/01/2021   VD25OH 44.6 02/03/2020   VD25OH 31.8 10/07/2019   Lab Results  Component Value Date   WBC 7.4 04/22/2021   HGB 12.5 04/22/2021   HCT 38.2 04/22/2021   MCV 87.8 04/22/2021   PLT 210 04/22/2021  Attestation Statements:   Reviewed by clinician on day of visit: allergies, medications, problem list, medical history, surgical history, family history, social history, and previous encounter notes.  Coral Ceo, CMA, am acting as transcriptionist for Coralie Common, MD.   I have reviewed the above documentation for accuracy and completeness, and I agree with the above. - Coralie Common, MD

## 2021-06-18 IMAGING — CT CT ANGIO CHEST
2 of 6 series · 17 of 36 positions shown · IV contrast (omnipaque)
Comparison: CT angiography 03/05/2019

CLINICAL DATA: Chest and lower extremity pain, not anticoagulated,
emesis since [REDACTED]

EXAM:
CT ANGIOGRAPHY CHEST WITH CONTRAST
TECHNIQUE: Multidetector CT imaging of the chest was performed using the
standard protocol during bolus administration of intravenous
contrast. Multiplanar CT image reconstructions and MIPs were
obtained to evaluate the vascular anatomy.
CONTRAST:  100mL OMNIPAQUE IOHEXOL 350 MG/ML SOLN

[Series 5: thins · axial · 0.59mm/px · z∈[+1254,+1502]mm · 16 of 280 slices shown]
[im 16/280  lung]
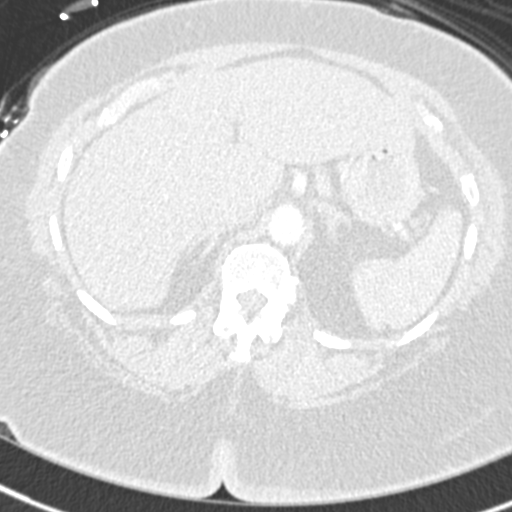
[im 32/280  mediastinal]
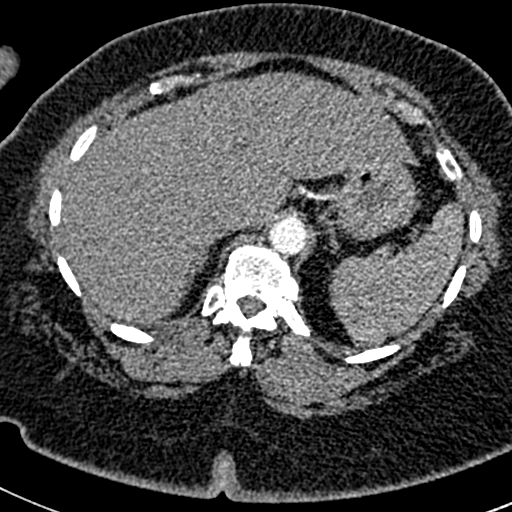
[im 47/280  lung]
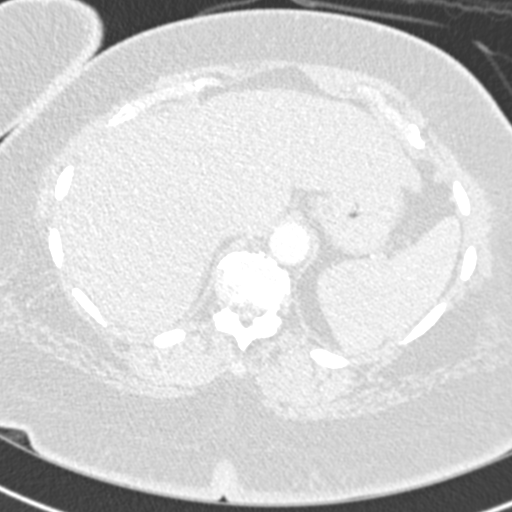
[im 63/280  mediastinal]
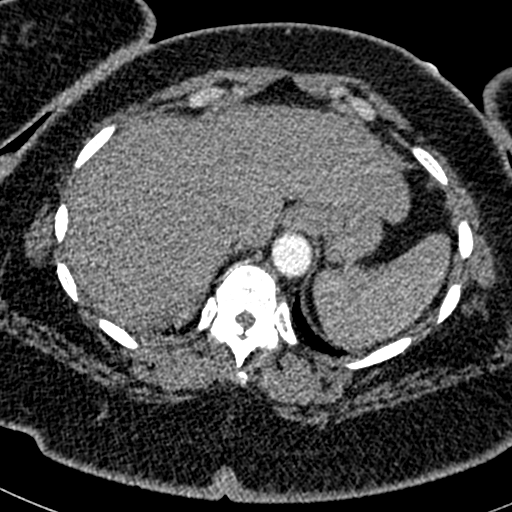
[im 78/280  lung]
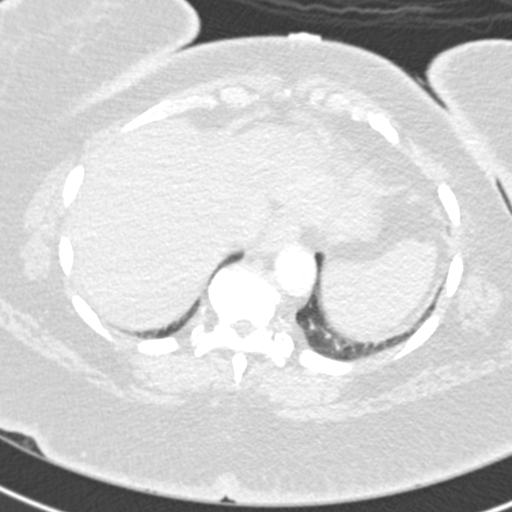
[im 94/280  mediastinal]
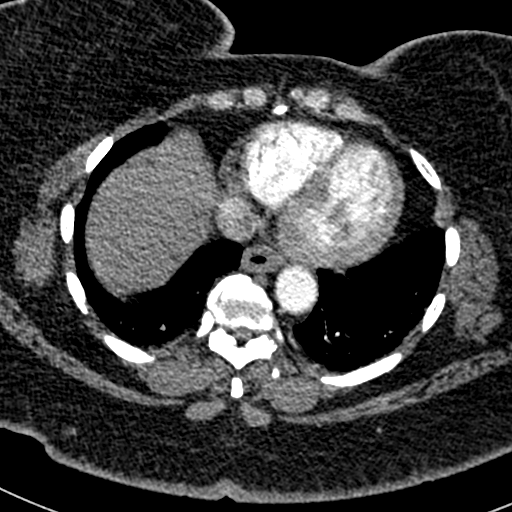
[im 109/280  lung]
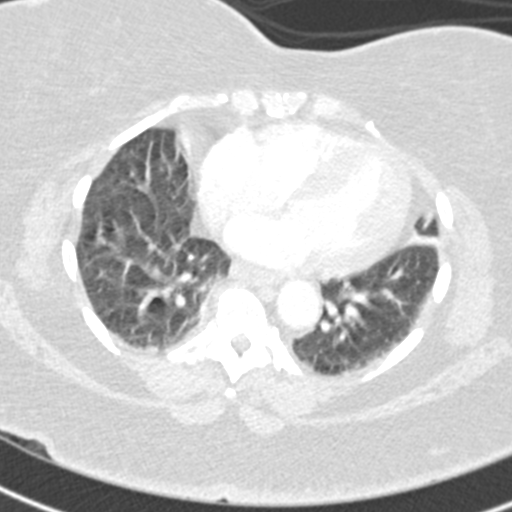
[im 125/280  mediastinal]
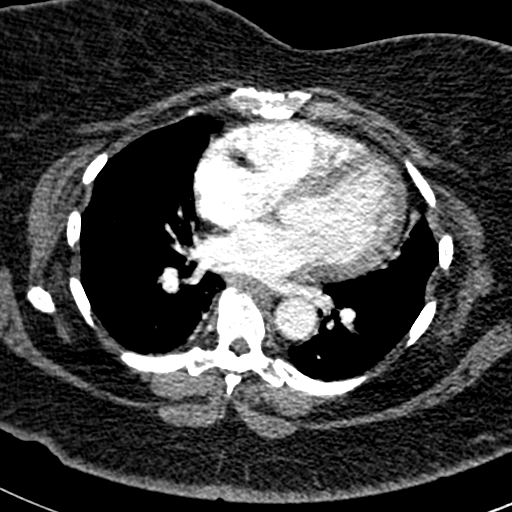
[im 156/280  lung]
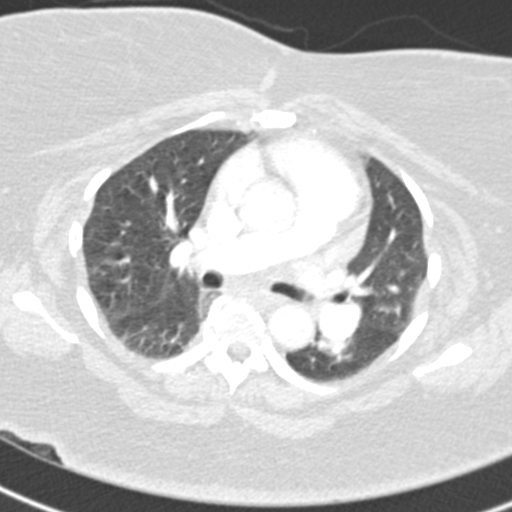
[im 171/280  mediastinal]
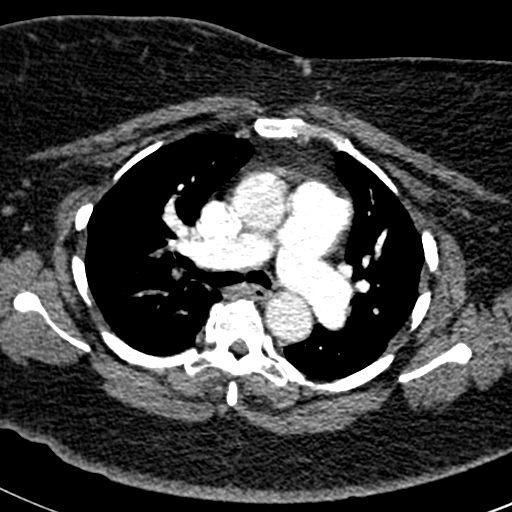
[im 187/280  lung]
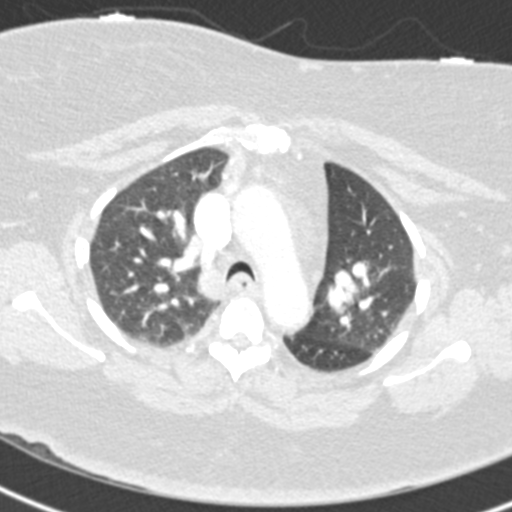
[im 202/280  mediastinal]
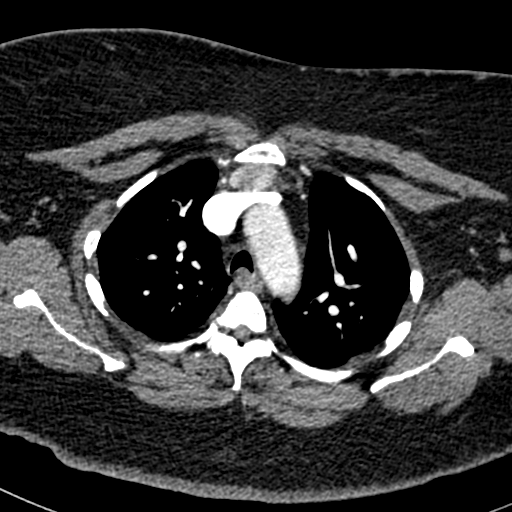
[im 218/280  lung]
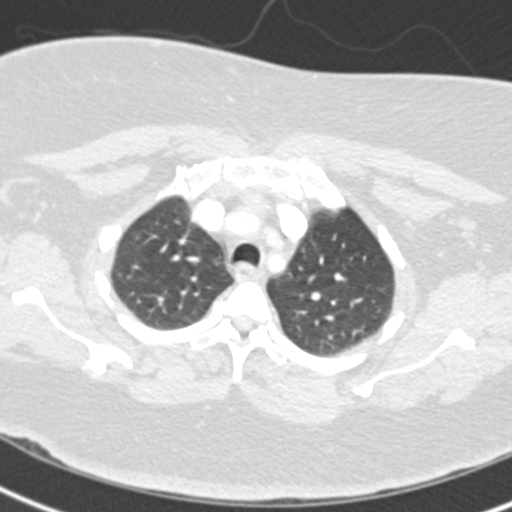
[im 233/280  mediastinal]
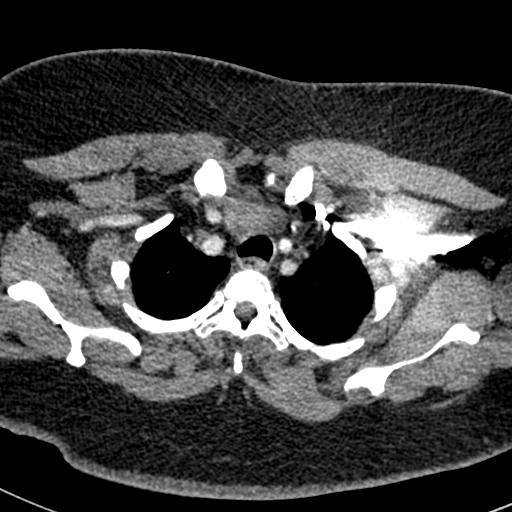
[im 249/280  lung]
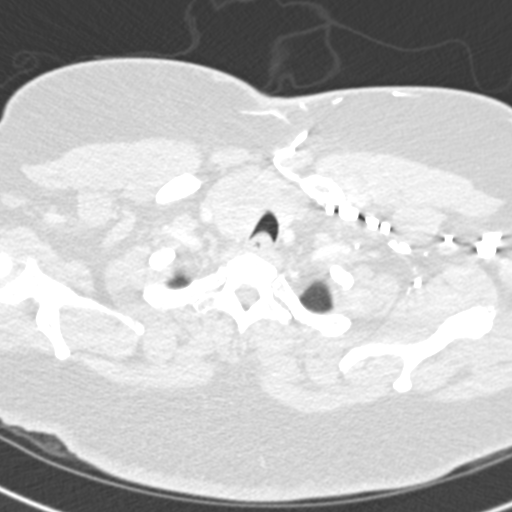
[im 264/280  mediastinal]
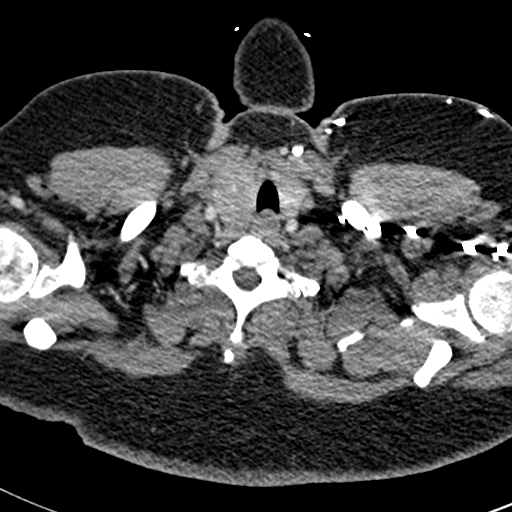

[Series 6: coronal mpr · coronal · 0.59mm/px · 1 of 151 slices shown]
[im 76/151  mediastinal]
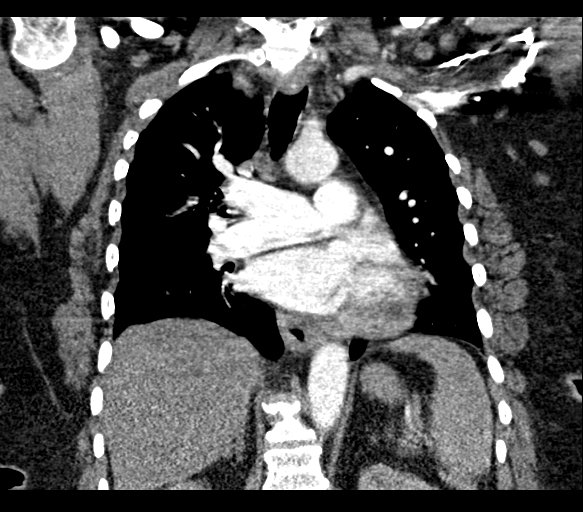

[17 of 36 positions shown; findings below may reference images not displayed]

FINDINGS: Cardiovascular: Borderline opacification of the central pulmonary
arteries and extensive respiratory motion artifact may limit
detection of smaller segmental and subsegmental pulmonary artery
filling defects. No large central or lobar filling defects are
identified. Central pulmonary arteries are enlarged though similar
to the comparison exam 03/05/2019. Stable mild cardiomegaly. The
aorta is normal caliber. Shared origin of the brachiocephalic and
left common carotid arteries. Proximal great vessels are otherwise
unremarkable.

Mediastinum/Nodes: Enlarged, heterogeneous and multinodular thyroid
including a nodule which extends inferiorly from the right thyroid
lobe into the thoracic inlet measuring up to 2.3 cm in size. No
mediastinal fluid or gas. No acute abnormality of the trachea or
esophagus. No worrisome mediastinal, hilar or axillary adenopathy.

Lungs/Pleura: Lung volumes are low with streaky areas of atelectasis
most pronounced towards the lung bases. More bandlike areas of
opacity could reflect subsegmental atelectasis most notably within
the lingula. No consolidation, features of edema, pneumothorax, or
effusion. No suspicious pulmonary nodules or masses are visible
within the limitations of detection imposed by respiratory motion
artifact.

Upper Abdomen: No acute abnormalities present in the visualized
portions of the upper abdomen.

Musculoskeletal: No acute osseous abnormality or suspicious osseous
lesion. Multilevel degenerative changes are present in the imaged
portions of the spine.

Review of the MIP images confirms the above findings.
IMPRESSION: 1. Borderline opacification of the central pulmonary arteries and
extensive respiratory motion artifact may limit detection of smaller
segmental and subsegmental pulmonary artery filling defects. No
large central or lobar filling defects are identified.
2. Central pulmonary arteries are enlarged though similar to the
comparison exam 03/05/2019, likely reflecting underlying pulmonary
artery hypertension.
3. Low lung volumes with streaky areas of atelectasis most
pronounced towards the lung bases. No other acute cardiopulmonary
abnormality.
4. Enlarged, heterogeneous and multinodular thyroid including a
nodule which extends inferiorly from the right thyroid lobe into the
thoracic inlet measuring up to 2.3 cm in size. Consider further
evaluation with nonemergent outpatient thyroid ultrasound. This
follows consensus guidelines: Managing Incidental Thyroid Nodules
Detected on Imaging: White Paper of [REDACTED]. [HOSPITAL] 7569; [DATE]. and [employer]-[place]
3-tiered system for managing ITNs: [HOSPITAL]. 7569;

## 2021-06-22 LAB — COMPREHENSIVE METABOLIC PANEL
Albumin: 4.7 (ref 3.5–5.0)
Calcium: 10.5 (ref 8.7–10.7)
Globulin: 2.7

## 2021-06-22 LAB — BASIC METABOLIC PANEL
BUN: 22 — AB (ref 4–21)
CO2: 29 — AB (ref 13–22)
Chloride: 104 (ref 99–108)
Creatinine: 1.1 (ref 0.5–1.1)
Glucose: 100
Potassium: 3.9 mEq/L (ref 3.5–5.1)
Sodium: 143 (ref 137–147)

## 2021-06-22 LAB — CBC: RBC: 4.63 (ref 3.87–5.11)

## 2021-06-22 LAB — LIPID PANEL
Cholesterol: 163 (ref 0–200)
HDL: 51 (ref 35–70)
LDL Cholesterol: 87
Triglycerides: 150 (ref 40–160)

## 2021-06-22 LAB — CBC AND DIFFERENTIAL
HCT: 40 (ref 36–46)
Hemoglobin: 13.3 (ref 12.0–16.0)
Platelets: 210 10*3/uL (ref 150–400)
WBC: 6.3

## 2021-06-22 LAB — HEPATIC FUNCTION PANEL
ALT: 20 U/L (ref 7–35)
AST: 16 (ref 13–35)
Alkaline Phosphatase: 50 (ref 25–125)
Bilirubin, Total: 0.6

## 2021-07-07 ENCOUNTER — Other Ambulatory Visit (INDEPENDENT_AMBULATORY_CARE_PROVIDER_SITE_OTHER): Payer: Self-pay | Admitting: Family Medicine

## 2021-07-07 NOTE — Telephone Encounter (Signed)
Dr.Ukleja 

## 2021-07-11 ENCOUNTER — Ambulatory Visit (INDEPENDENT_AMBULATORY_CARE_PROVIDER_SITE_OTHER): Payer: Medicare Other | Admitting: Bariatrics

## 2021-07-11 ENCOUNTER — Encounter (INDEPENDENT_AMBULATORY_CARE_PROVIDER_SITE_OTHER): Payer: Self-pay | Admitting: Bariatrics

## 2021-07-11 ENCOUNTER — Other Ambulatory Visit: Payer: Self-pay

## 2021-07-11 ENCOUNTER — Ambulatory Visit (INDEPENDENT_AMBULATORY_CARE_PROVIDER_SITE_OTHER): Payer: Medicare Other | Admitting: Family Medicine

## 2021-07-11 VITALS — BP 99/65 | HR 64 | Temp 97.7°F | Ht 59.0 in | Wt 201.0 lb

## 2021-07-11 DIAGNOSIS — Z6841 Body Mass Index (BMI) 40.0 and over, adult: Secondary | ICD-10-CM

## 2021-07-11 DIAGNOSIS — E669 Obesity, unspecified: Secondary | ICD-10-CM

## 2021-07-11 DIAGNOSIS — E1169 Type 2 diabetes mellitus with other specified complication: Secondary | ICD-10-CM

## 2021-07-11 DIAGNOSIS — Z7984 Long term (current) use of oral hypoglycemic drugs: Secondary | ICD-10-CM

## 2021-07-11 DIAGNOSIS — E785 Hyperlipidemia, unspecified: Secondary | ICD-10-CM | POA: Diagnosis not present

## 2021-07-11 MED ORDER — OZEMPIC (0.25 OR 0.5 MG/DOSE) 2 MG/1.5ML ~~LOC~~ SOPN: 0.5000 mg | PEN_INJECTOR | SUBCUTANEOUS | 0 refills | Status: DC

## 2021-07-11 MED ORDER — METFORMIN HCL 500 MG PO TABS: 500.0000 mg | ORAL_TABLET | Freq: Every day | ORAL | 0 refills | Status: DC

## 2021-07-13 ENCOUNTER — Encounter (INDEPENDENT_AMBULATORY_CARE_PROVIDER_SITE_OTHER): Payer: Self-pay | Admitting: Bariatrics

## 2021-07-13 NOTE — Progress Notes (Signed)
? ? ? ?Chief Complaint:  ? ?OBESITY ?Jaeleah is here to discuss her progress with her obesity treatment plan along with follow-up of her obesity related diagnoses. Temprance is on the Category 2 Plan and states she is following her eating plan approximately 80% of the time. Brittain states she is walking for 10 minutes 3 times per week. ? ?Today's visit was #: 2 ?Starting weight: 209 lbs ?Starting date: 06/01/2021 ?Today's weight: 201 lbs ?Today's date: 07/11/2021 ?Total lbs lost to date: 8 lbs ?Total lbs lost since last in-office visit: 3 lbs ? ?Interim History: Laurene is down an additional 3 lbs since her last visit. ? ?Subjective:  ? ?1. Type 2 diabetes mellitus with other specified complication, without long-term current use of insulin ([place]) ?Janisse denies nausea. She is taking her medications as directed.  ? ?2. Hyperlipidemia associated with type 2 diabetes mellitus ([place]) ?Mala is currently taking Lipitor.  ? ?Assessment/Plan:  ? ?1. Type 2 diabetes mellitus with other specified complication, without long-term current use of insulin ([place]) ?We will refill Semaglutide 0.5 mg for 1 month with no refills. We will refill Metformin 500 mg with no refills.Good blood sugar control is important to decrease the likelihood of diabetic complications such as nephropathy, neuropathy, limb loss, blindness, coronary artery disease, and death. Intensive lifestyle modification including diet, exercise and weight loss are the first line of treatment for diabetes.  ? ?- metFORMIN (GLUCOPHAGE) 500 MG tablet; Take 1 tablet (500 mg total) by mouth daily with breakfast.  Dispense: 30 tablet; Refill: 0 ?- Semaglutide,0.25 or 0.'5MG'$ /DOS, (OZEMPIC, 0.25 OR 0.5 MG/DOSE,) 2 MG/1.5ML SOPN; Inject 0.5 mg into the skin once a week.  Dispense: 1.5 mL; Refill: 0 ? ?2. Hyperlipidemia associated with type 2 diabetes mellitus ([place]) ?Cardiovascular risk and specific lipid/LDL goals reviewed.  Marelin will continue taking Lipitor. We will read labels. We  discussed several lifestyle modifications today and Allyna will continue to work on diet, exercise and weight loss efforts. Orders and follow up as documented in patient record.  ? ?Counseling ?Intensive lifestyle modifications are the first line treatment for this issue. ?Dietary changes: Increase soluble fiber. Decrease simple carbohydrates. ?Exercise changes: Moderate to vigorous-intensity aerobic activity 150 minutes per week if tolerated. ?Lipid-lowering medications: see documented in medical record. ? ?3. Obesity with current BMI of 40.8 ?Sonyia is currently in the action stage of change. As such, her goal is to continue with weight loss efforts. She has agreed to the Category 2 Plan.  ? ?[name] is down an additional 3 lbs since her last visit. ? ?Exercise goals:  As is. ? ?Behavioral modification strategies: increasing lean protein intake, decreasing simple carbohydrates, increasing vegetables, increasing water intake, decreasing eating out, no skipping meals, meal planning and cooking strategies, keeping healthy foods in the home, and planning for success. ? ?Makensey has agreed to follow-up with our clinic in 2 weeks with Jake Bathe, FNP and 4 weeks with myself. She was informed of the importance of frequent follow-up visits to maximize her success with intensive lifestyle modifications for her multiple health conditions.  ? ?Objective:  ? ?Pulse 64, temperature 97.7 ?F (36.5 ?C), height '4\' 11"'$  (1.499 m), weight 201 lb (91.2 kg), SpO2 99 %. ?Body mass index is 40.6 kg/m?. ? ?General: Cooperative, alert, well developed, in no acute distress. ?HEENT: Conjunctivae and lids unremarkable. ?Cardiovascular: Regular rhythm.  ?Lungs: Normal work of breathing. ?Neurologic: No focal deficits.  ? ?Lab Results  ?Component Value Date  ? CREATININE 1.05 (H) 04/22/2021  ?  BUN 17 04/22/2021  ? NA 142 04/22/2021  ? K 3.3 (L) 04/22/2021  ? CL 105 04/22/2021  ? CO2 30 04/22/2021  ? ?Lab Results  ?Component Value Date  ? ALT  31 03/09/2021  ? AST 18 03/09/2021  ? ALKPHOS 49 03/09/2021  ? BILITOT 0.6 03/09/2021  ? ?Lab Results  ?Component Value Date  ? HGBA1C 8.4 (H) 03/08/2021  ? HGBA1C 7.6 (H) 02/03/2020  ? HGBA1C 7.5 (H) 10/07/2019  ? HGBA1C 6.3 (H) 12/26/2018  ? HGBA1C 6.6 (H) 05/22/2018  ? ?Lab Results  ?Component Value Date  ? INSULIN 26.8 (H) 06/01/2021  ? INSULIN 44.4 (H) 02/03/2020  ? INSULIN 28.1 (H) 12/26/2018  ? INSULIN 20.2 03/04/2018  ? INSULIN 53.9 (H) 11/20/2017  ? ?Lab Results  ?Component Value Date  ? TSH 0.506 04/23/2021  ? ?Lab Results  ?Component Value Date  ? CHOL 167 06/01/2021  ? HDL 49 06/01/2021  ? [place] 87 06/01/2021  ? TRIG 182 (H) 06/01/2021  ? CHOLHDL 4.2 04/23/2011  ? CHOLHDL 4.3 04/23/2011  ? ?Lab Results  ?Component Value Date  ? VD25OH 30.0 06/01/2021  ? VD25OH 44.6 02/03/2020  ? VD25OH 31.8 10/07/2019  ? ?Lab Results  ?Component Value Date  ? WBC 7.4 04/22/2021  ? HGB 12.5 04/22/2021  ? HCT 38.2 04/22/2021  ? MCV 87.8 04/22/2021  ? PLT 210 04/22/2021  ? ?No results found for: IRON, TIBC, FERRITIN ? ?Attestation Statements:  ? ?Reviewed by clinician on day of visit: allergies, medications, problem list, medical history, surgical history, family history, social history, and previous encounter notes. ? ?I, Lizbeth Bark, RMA, am acting as transcriptionist for [employer], DO. ? ?I have reviewed the above documentation for accuracy and completeness, and I agree with the above. Jearld Lesch, DO ? ?

## 2021-07-18 ENCOUNTER — Other Ambulatory Visit (INDEPENDENT_AMBULATORY_CARE_PROVIDER_SITE_OTHER): Payer: Self-pay | Admitting: Family Medicine

## 2021-07-18 DIAGNOSIS — F419 Anxiety disorder, unspecified: Secondary | ICD-10-CM

## 2021-07-19 ENCOUNTER — Other Ambulatory Visit (INDEPENDENT_AMBULATORY_CARE_PROVIDER_SITE_OTHER): Payer: Self-pay | Admitting: Family Medicine

## 2021-07-19 DIAGNOSIS — F419 Anxiety disorder, unspecified: Secondary | ICD-10-CM

## 2021-07-30 IMAGING — CR DG CHEST 2V
2 series · 2 of 2 positions shown · non-contrast
Comparison: PA and lateral chest 06/21/2019.  CT chest 08/14/2019.

CLINICAL DATA: Onset chest pain, shortness of breath and cough
yesterday.

EXAM:
CHEST - 2 VIEW

[w chest pa]
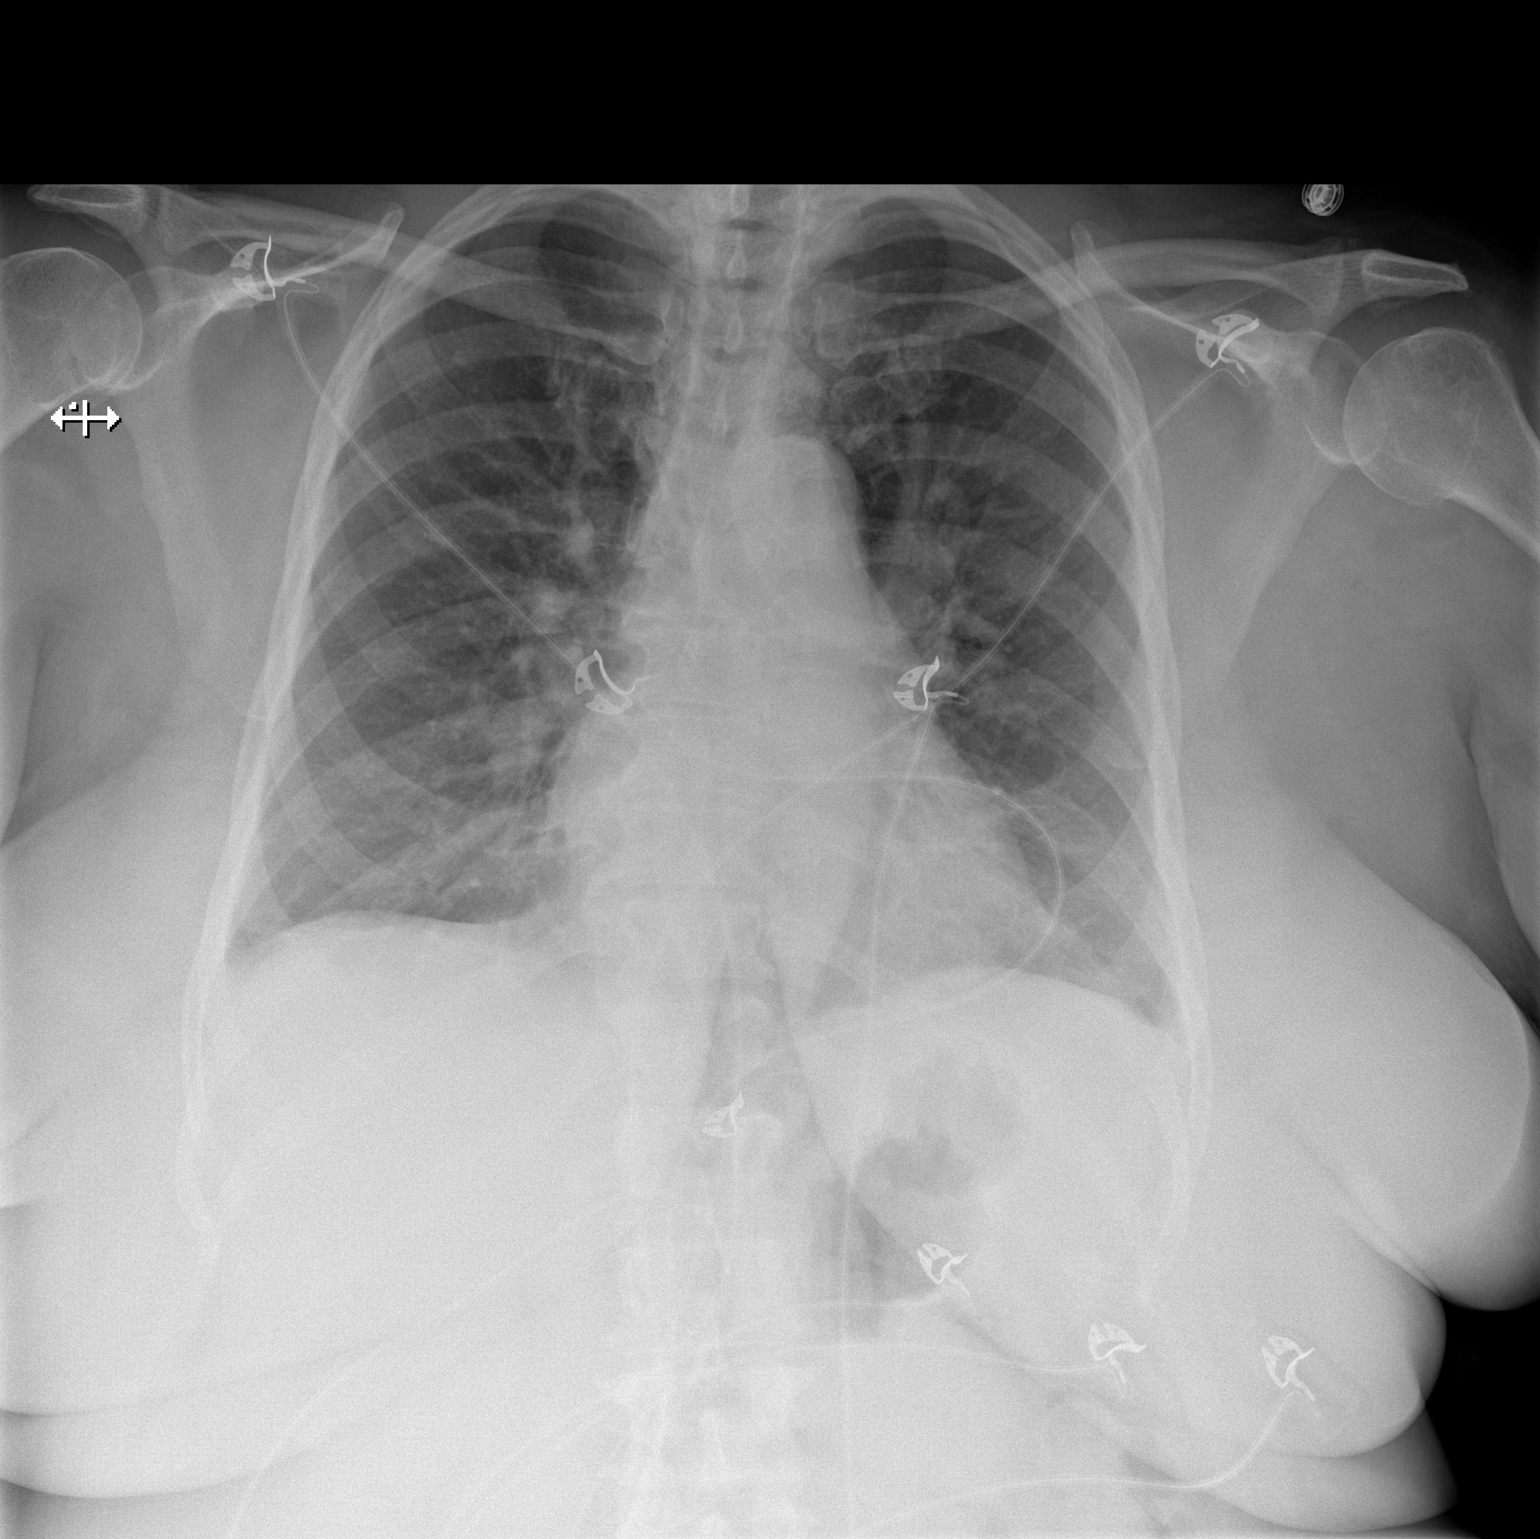

[w chest lat]
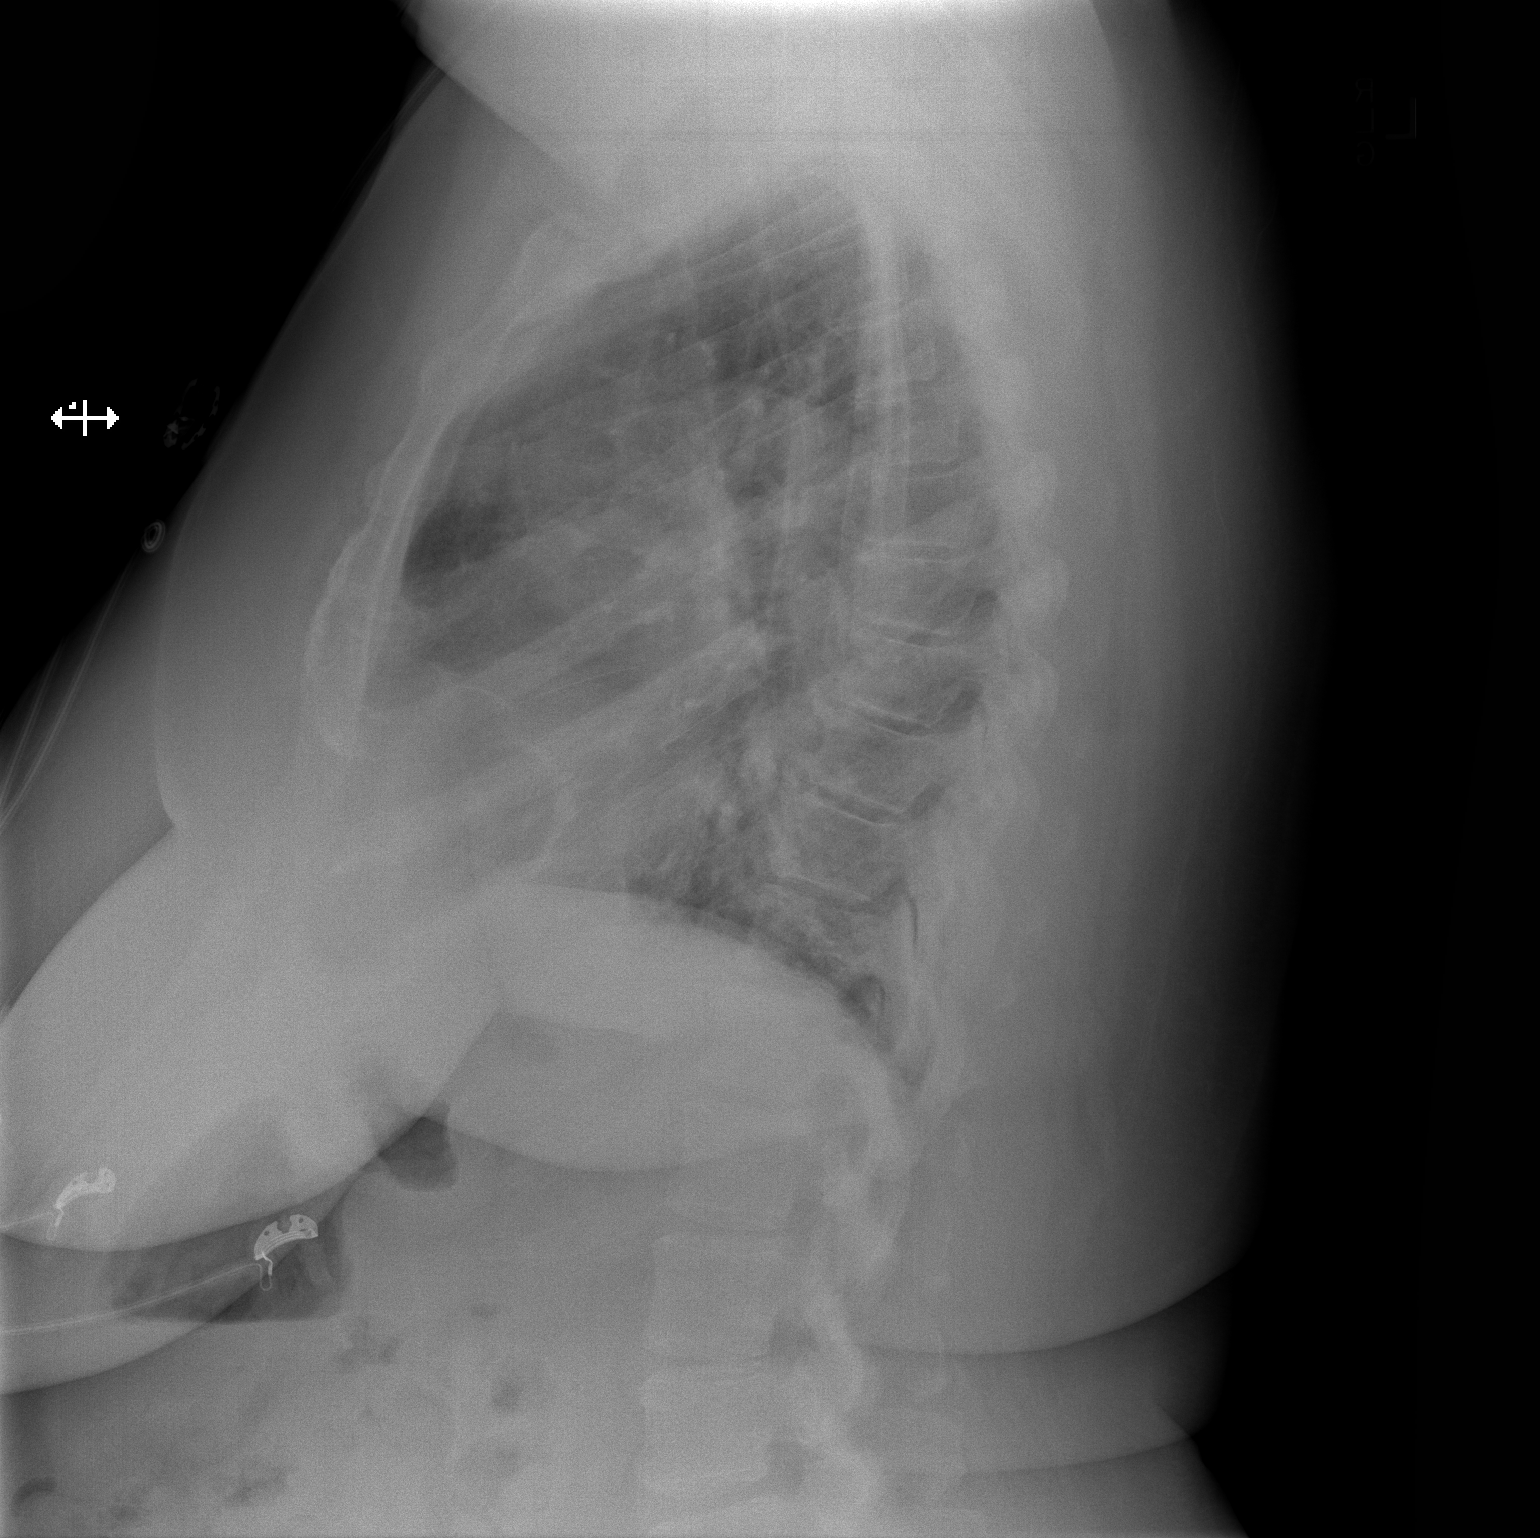

[2 of 2 positions shown; findings below may reference images not displayed]

FINDINGS: Lungs are clear. Heart size is normal. No pneumothorax or pleural
fluid. No acute or focal bony abnormality.
IMPRESSION: Negative chest.

## 2021-07-30 IMAGING — CT CT ANGIO CHEST
2 of 6 series · 18 of 36 positions shown · IV contrast (OMNIPAQUE 350)
Comparison: August 14, 2019 CT angiogram chest; chest radiograph
September 25, 2019

CLINICAL DATA: Shortness of breath chest pain

EXAM:
CT ANGIOGRAPHY CHEST WITH CONTRAST
TECHNIQUE: Multidetector CT imaging of the chest was performed using the
standard protocol during bolus administration of intravenous
contrast. Multiplanar CT image reconstructions and MIPs were
obtained to evaluate the vascular anatomy.
CONTRAST:  100mL OMNIPAQUE IOHEXOL 350 MG/ML SOLN

[Series 5: thins · axial · 0.62mm/px · z∈[+1398,+1614]mm · 17 of 244 slices shown]
[im 14/244  lung]
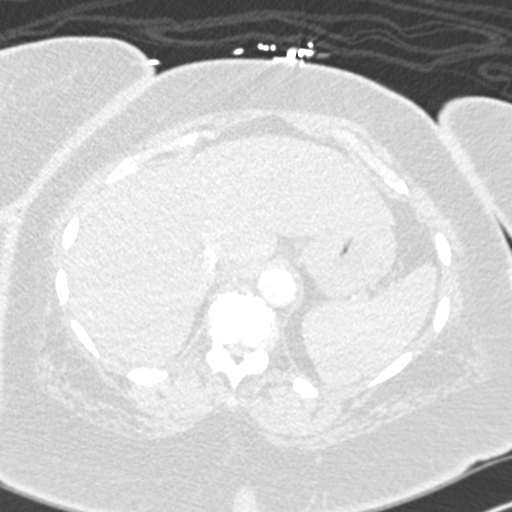
[im 28/244  mediastinal]
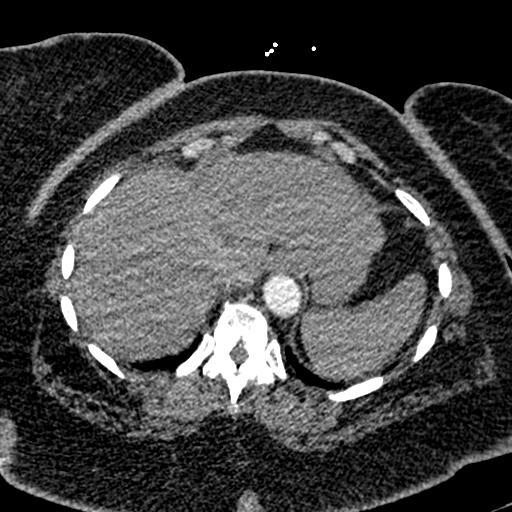
[im 41/244  lung]
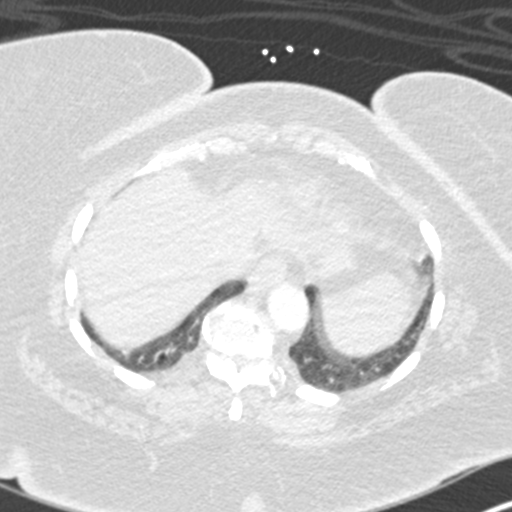
[im 55/244  mediastinal]
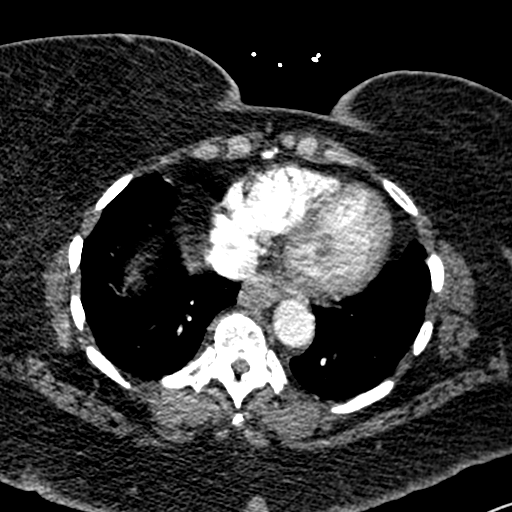
[im 68/244  lung]
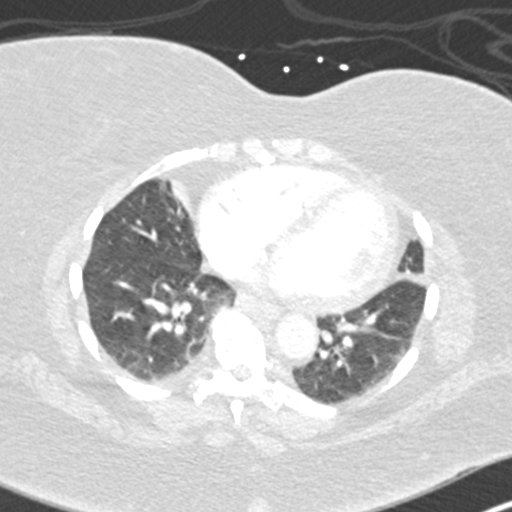
[im 82/244  mediastinal]
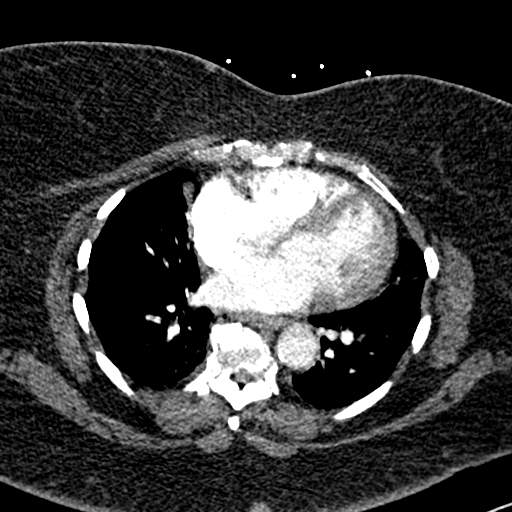
[im 95/244  lung]
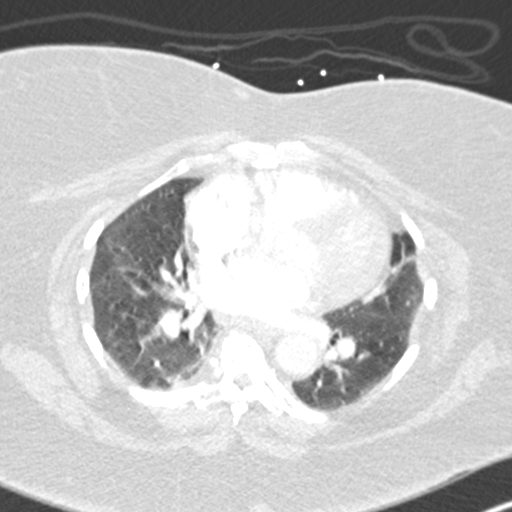
[im 109/244  mediastinal]
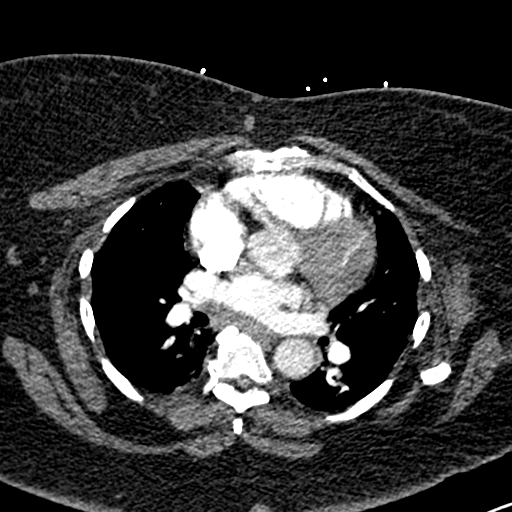
[im 122/244  lung]
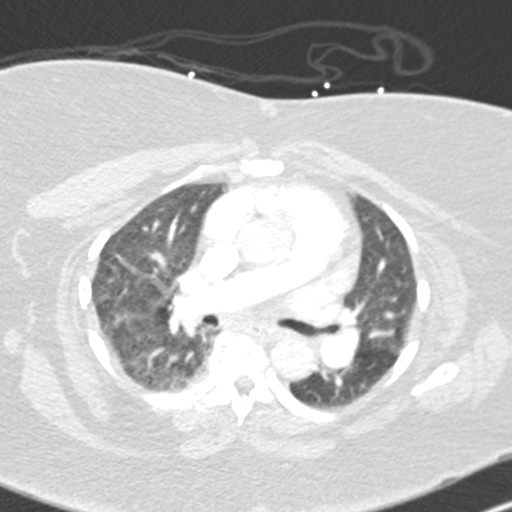
[im 136/244  mediastinal]
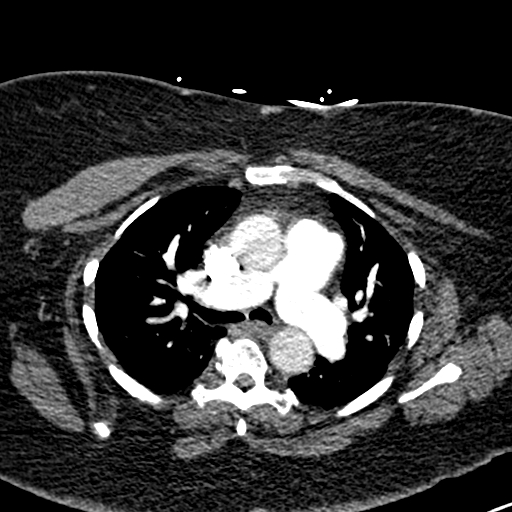
[im 149/244  lung]
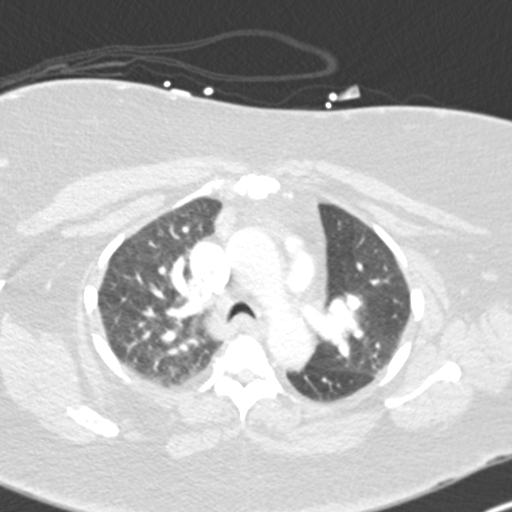
[im 163/244  mediastinal]
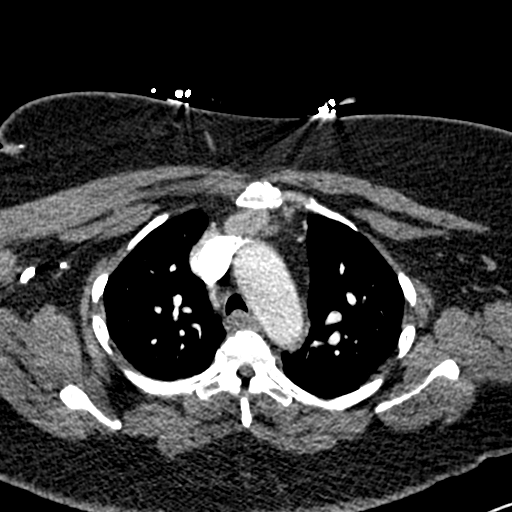
[im 176/244  lung]
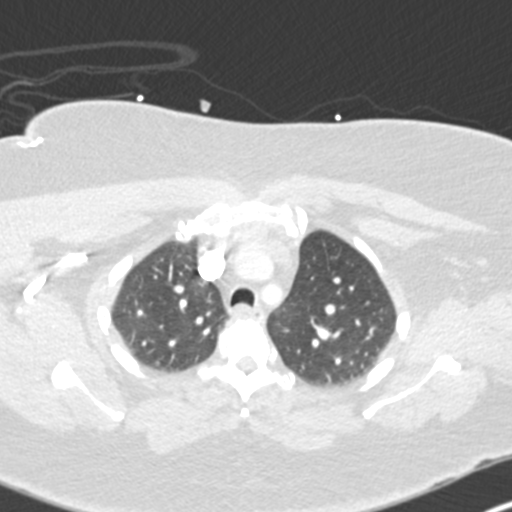
[im 190/244  mediastinal]
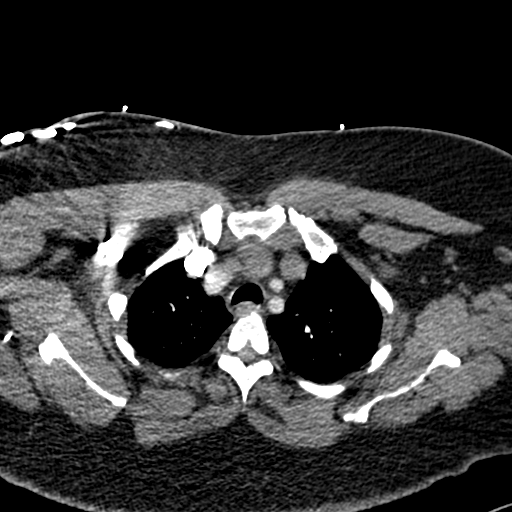
[im 203/244  lung]
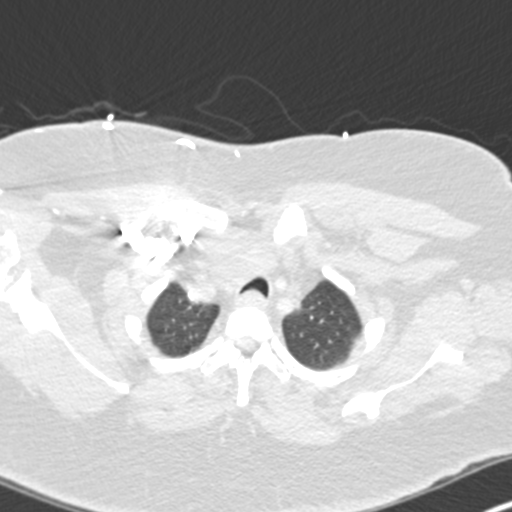
[im 217/244  mediastinal]
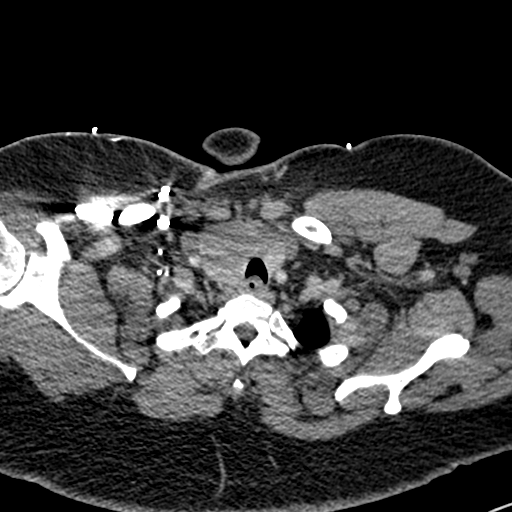
[im 230/244  lung]
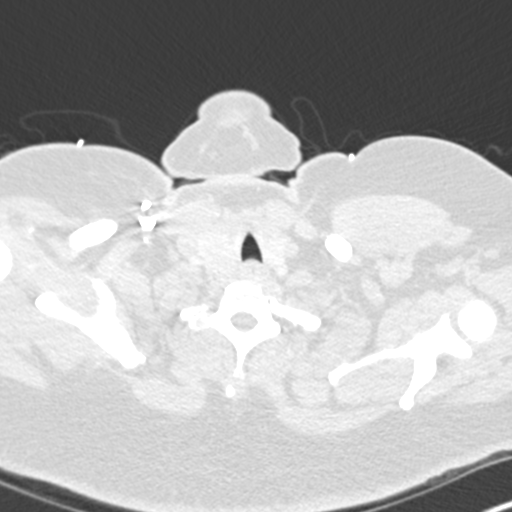

[Series 7: coronal mpr · coronal · 0.49mm/px · 1 of 132 slices shown]
[im 66/132  mediastinal]
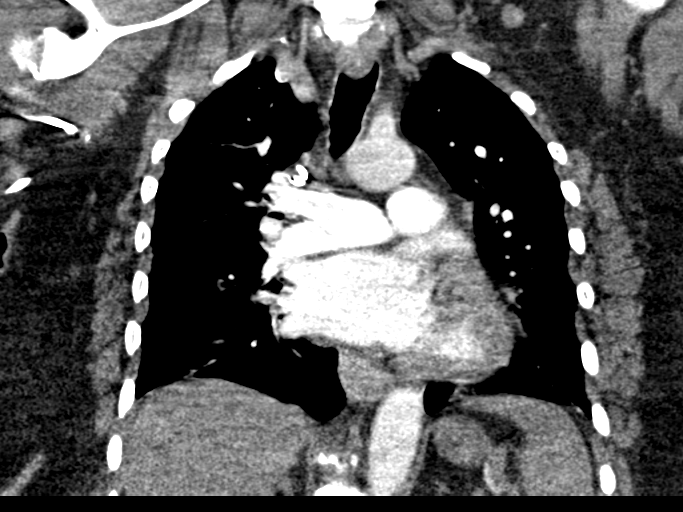

[18 of 36 positions shown; findings below may reference images not displayed]

FINDINGS: Cardiovascular: There is no demonstrable pulmonary embolus. There is
no evident thoracic aortic aneurysm or dissection. Visualized great
vessels appear normal. Right innominate and left common carotid
arteries arise as a common trunk, an anatomic variant. There are
foci of aortic atherosclerosis. No pericardial effusion or
pericardial thickening evident.

Mediastinum/Nodes: Thyroid remains enlarged with a dominant mass
arising from the right lobe of the thyroid measuring 3.0 x 2.5 cm.
Thyroid otherwise appears somewhat inhomogeneous. There is no
appreciable thoracic adenopathy. Small hiatal hernia noted.

Lungs/Pleura: There are scattered areas of lower lobe atelectatic
change bilaterally. No edema or airspace consolidation. No pleural
effusions evident.

Upper Abdomen: There is slight reflux of contrast into the inferior
vena cava and hepatic veins. Visualized upper abdominal structures
otherwise appear normal.

Musculoskeletal: There is degenerative change in the thoracic spine.
There are no blastic or lytic bone lesions.

Review of the MIP images confirms the above findings.
IMPRESSION: 1. No demonstrable pulmonary embolus. No thoracic aortic aneurysm or
dissection. Foci of aortic atherosclerosis noted.

2. Enlarged right lobe of the thyroid with dominant mass measuring
3.0 x 2.5 cm. Recommend thyroid US (ref: [HOSPITAL]. [DATE]): 143-50).

3. Scattered areas of atelectatic change bilaterally. No edema or
airspace opacity. No pleural effusions.

4.  No evident adenopathy.

5. Slight reflux of contrast in the inferior vena cava and hepatic
veins may be indicative of a degree of increased right heart
pressure.

Aortic Atherosclerosis ([id number]-JOV.V).

## 2021-08-01 ENCOUNTER — Ambulatory Visit (INDEPENDENT_AMBULATORY_CARE_PROVIDER_SITE_OTHER): Payer: Medicare Other | Admitting: Family Medicine

## 2021-08-01 IMAGING — CR DG CHEST 2V
2 series · 2 of 2 positions shown · non-contrast
Comparison: Radiograph and chest CTA 2 days ago 09/25/2019

CLINICAL DATA: Cough and fever.

EXAM:
CHEST - 2 VIEW

[w chest pa]
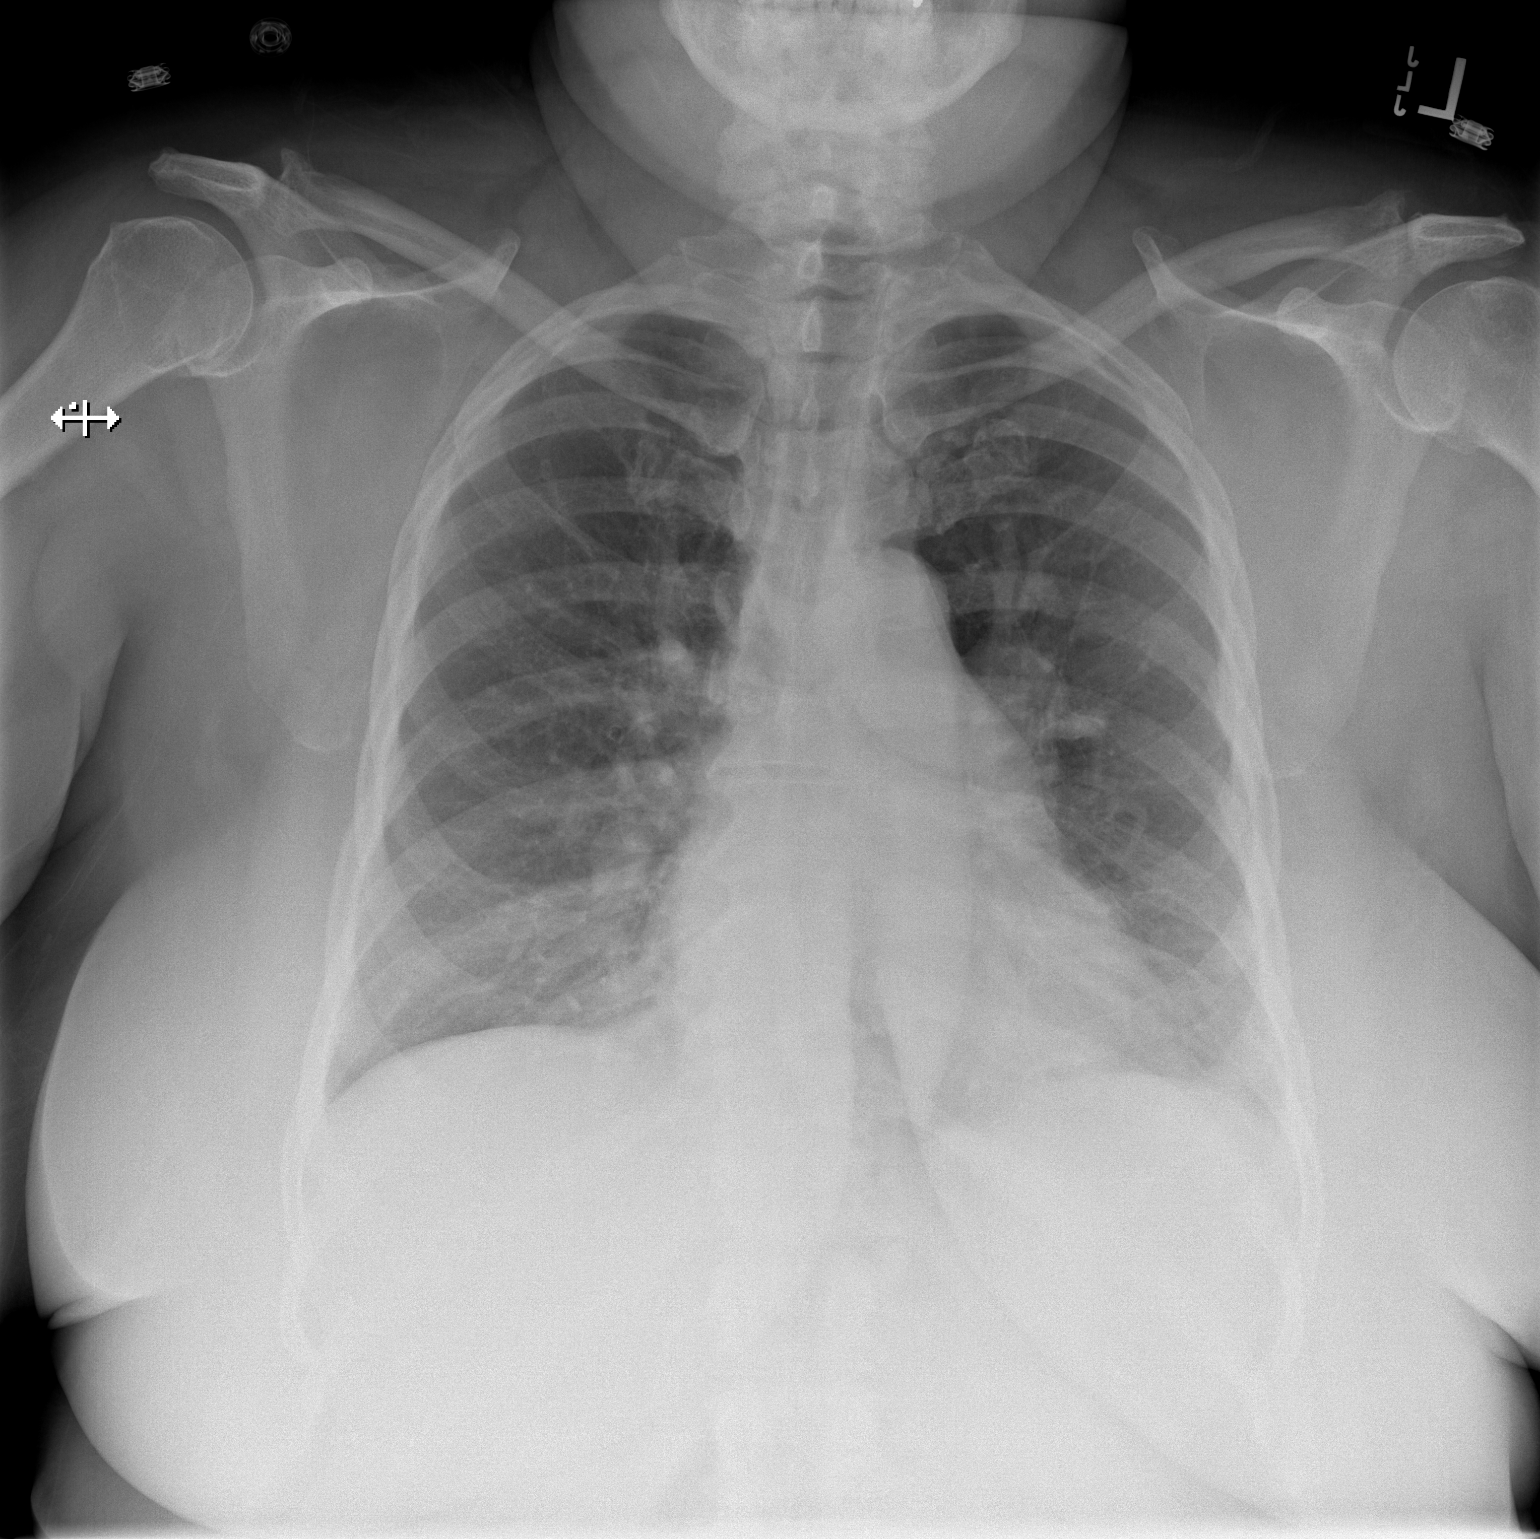

[w chest lat]
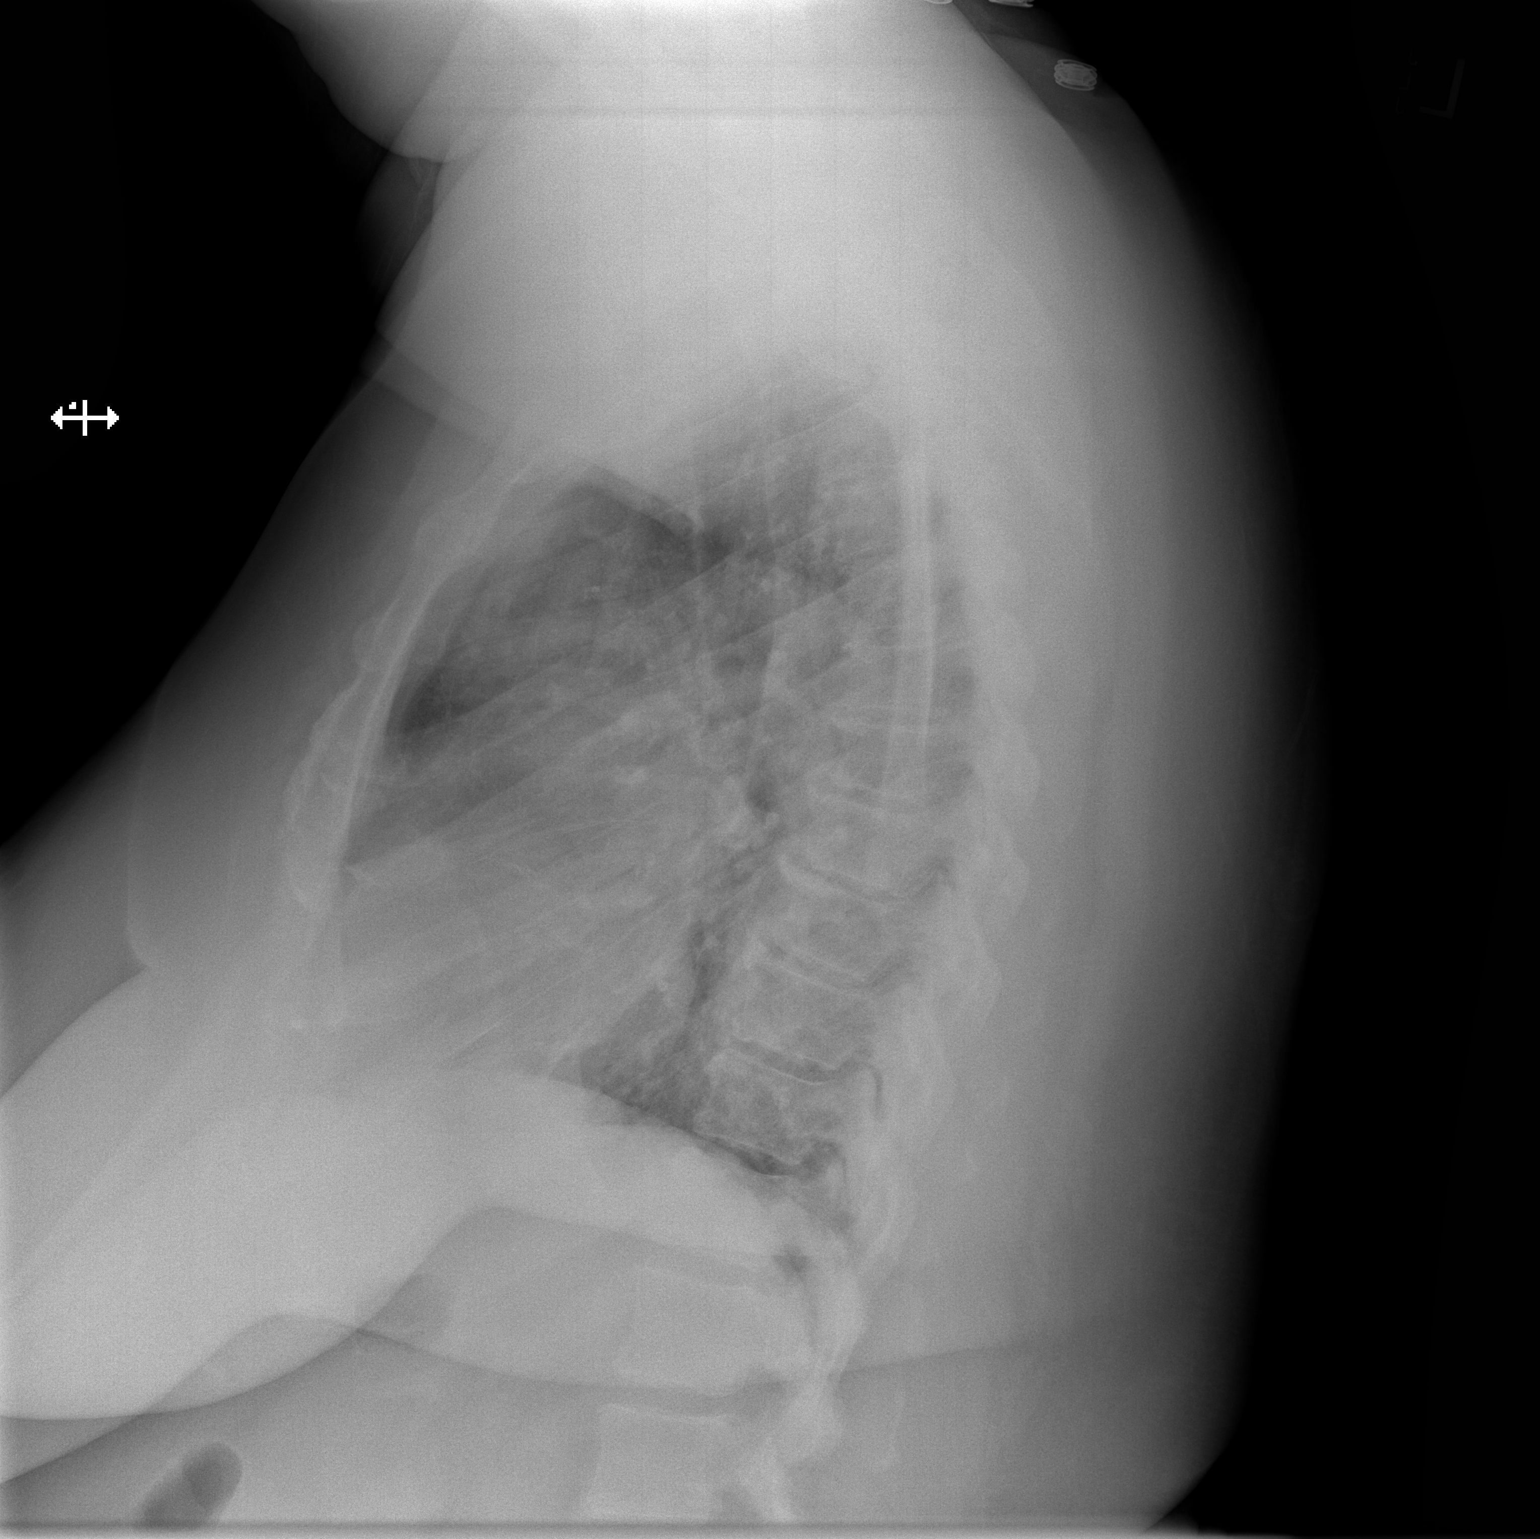

[2 of 2 positions shown; findings below may reference images not displayed]

FINDINGS: The cardiomediastinal contours are unchanged. Pulmonary vasculature
is normal. No consolidation, pleural effusion, or pneumothorax. No
acute osseous abnormalities are seen.
IMPRESSION: No acute chest findings. No change from radiograph and chest CT 2
days ago.

## 2021-08-04 ENCOUNTER — Encounter (INDEPENDENT_AMBULATORY_CARE_PROVIDER_SITE_OTHER): Payer: Self-pay | Admitting: Family Medicine

## 2021-08-04 ENCOUNTER — Ambulatory Visit (INDEPENDENT_AMBULATORY_CARE_PROVIDER_SITE_OTHER): Payer: Medicare Other | Admitting: Family Medicine

## 2021-08-04 VITALS — BP 86/54 | HR 88 | Temp 97.8°F | Ht 59.0 in | Wt 201.0 lb

## 2021-08-04 DIAGNOSIS — I152 Hypertension secondary to endocrine disorders: Secondary | ICD-10-CM

## 2021-08-04 DIAGNOSIS — Z6841 Body Mass Index (BMI) 40.0 and over, adult: Secondary | ICD-10-CM

## 2021-08-04 DIAGNOSIS — E1169 Type 2 diabetes mellitus with other specified complication: Secondary | ICD-10-CM

## 2021-08-04 DIAGNOSIS — E1159 Type 2 diabetes mellitus with other circulatory complications: Secondary | ICD-10-CM

## 2021-08-04 DIAGNOSIS — E559 Vitamin D deficiency, unspecified: Secondary | ICD-10-CM | POA: Diagnosis not present

## 2021-08-04 MED ORDER — OZEMPIC (0.25 OR 0.5 MG/DOSE) 2 MG/1.5ML ~~LOC~~ SOPN: 0.5000 mg | PEN_INJECTOR | SUBCUTANEOUS | 0 refills | Status: DC

## 2021-08-04 MED ORDER — METFORMIN HCL 500 MG PO TABS: 500.0000 mg | ORAL_TABLET | Freq: Every day | ORAL | 0 refills | Status: DC

## 2021-08-09 ENCOUNTER — Other Ambulatory Visit (INDEPENDENT_AMBULATORY_CARE_PROVIDER_SITE_OTHER): Payer: Self-pay | Admitting: Bariatrics

## 2021-08-09 DIAGNOSIS — E1169 Type 2 diabetes mellitus with other specified complication: Secondary | ICD-10-CM

## 2021-08-17 NOTE — Progress Notes (Signed)
? ? ? ?Chief Complaint:  ? ?OBESITY ?[name] is here to discuss her progress with her obesity treatment plan along with follow-up of her obesity related diagnoses. [name] is on the Category 2 Plan and states she is following her eating plan approximately 80% of the time. [name] states she is walking and at the gym for 20-30 minutes 3 times per week. ? ?Today's visit was #: 3 ?Starting weight: 209 lbs ?Starting date: 06/01/2021 ?Today's weight: 201 lbs ?Today's date: 08/04/2021 ?Total lbs lost to date: 8 ?Total lbs lost since last in-office visit: 0 ? ?Interim History: [name] is a new patient to me. She had some challenges and ate off the plan at 2 celebration dinners and she felt it after with some GI distress due to taking Ozempic.  ? ?Subjective:  ? ?1. Type 2 diabetes mellitus with other specified complication, without long-term current use of insulin ([name]) ?[name]'s blood sugars run at 182 2 hour post prandial; she denies issues with hunger or cravings. She is not sure what her fasting blood sugars are. She has no concerns or new symptoms today. She is tolerating her medications well.  ? ?2. Hypertension associated with type 2 diabetes mellitus ([name]) ?[name] denies dizziness or lightheadedness. She feels "completely normal". ? ?3. [name] deficiency ?[name] is currently taking prescription [name] 50,000 IU each week. She denies nausea, vomiting or muscle weakness. ? ?Assessment/Plan:  ?No orders of the defined types were placed in this encounter. ? ? ?Medications Discontinued During This Encounter  ?Medication Reason  ? metFORMIN (GLUCOPHAGE) 500 MG tablet Reorder  ? Semaglutide,0.25 or 0.'5MG'$ /DOS, (OZEMPIC, 0.25 OR 0.5 MG/DOSE,) 2 MG/1.5ML SOPN Reorder  ?  ? ?Meds ordered this encounter  ?Medications  ? metFORMIN (GLUCOPHAGE) 500 MG tablet  ?  Sig: Take 1 tablet (500 mg total) by mouth daily with breakfast.  ?  Dispense:  30 tablet  ?  Refill:  0  ? Semaglutide,0.25 or 0.'5MG'$ /DOS, (OZEMPIC, 0.25 OR 0.5  MG/DOSE,) 2 MG/1.5ML SOPN  ?  Sig: Inject 0.5 mg into the skin once a week.  ?  Dispense:  1.5 mL  ?  Refill:  0  ?  ? ?1. Type 2 diabetes mellitus with other specified complication, without long-term current use of insulin ([name]) ?[name] will continue her medications, and we will refill metformin for 1 month, and refill Ozempic 0.5 mg for 1 month. Patient declines an increase in dose.  ? ?- metFORMIN (GLUCOPHAGE) 500 MG tablet; Take 1 tablet (500 mg total) by mouth daily with breakfast.  Dispense: 30 tablet; Refill: 0 ?- Semaglutide,0.25 or 0.'5MG'$ /DOS, (OZEMPIC, 0.25 OR 0.5 MG/DOSE,) 2 MG/1.5ML SOPN; Inject 0.5 mg into the skin once a week.  Dispense: 1.5 mL; Refill: 0 ? ?2. Hypertension associated with type 2 diabetes mellitus ([name]) ?Since [name] is asymptomatic, she will call her PCP today to discuss possible changes with her blood pressure medications. I stressed the importance of this to the patient.  ? ?3. [name] deficiency ?Low [name] level contributes to fatigue and are associated with obesity, breast, and colon cancer. We will refill prescription [name] 50,000 IU every week #4 for 1 month. [name] will follow-up for routine testing of [name], at least 2-3 times per year to avoid over-replacement. ? ?4. Obesity with current BMI of 40.7 ?[name] is currently in the action stage of change. As such, her goal is to continue with weight loss efforts. She has agreed to the Category 2 Plan.  ? ?[name]  to her PCP at [facility] in Feb and had A1c and other labs. She will bring in a copy of her results in the future. Recipes and handouts were given to the patient.  ? ?Exercise goals: As is. ? ?Behavioral modification strategies: increasing lean protein intake, decreasing simple carbohydrates, and decreasing eating out. ? ?[name] has agreed to follow-up with our clinic in 2 weeks with [name]. She was informed of the importance of frequent follow-up visits to maximize her success with intensive  lifestyle modifications for her multiple health conditions.  ? ?Objective:  ? ?Blood pressure (!) 86/54, pulse 88, temperature 97.8 ?F (36.6 ?C), height '4\' 11"'$  (1.499 m), weight 201 lb (91.2 kg), SpO2 98 %. ?Body mass index is 40.6 kg/m?. ? ?General: Cooperative, alert, well developed, in no acute distress. ?HEENT: Conjunctivae and lids unremarkable. ?Cardiovascular: Regular rhythm.  ?Lungs: Normal work of breathing. ?Neurologic: No focal deficits.  ? ?Lab Results  ?Component Value Date  ? CREATININE 1.05 (H) 04/22/2021  ? BUN 17 04/22/2021  ? NA 142 04/22/2021  ? K 3.3 (L) 04/22/2021  ? CL 105 04/22/2021  ? CO2 30 04/22/2021  ? ?Lab Results  ?Component Value Date  ? ALT 31 03/09/2021  ? AST 18 03/09/2021  ? ALKPHOS 49 03/09/2021  ? BILITOT 0.6 03/09/2021  ? ?Lab Results  ?Component Value Date  ? HGBA1C 8.4 (H) 03/08/2021  ? HGBA1C 7.6 (H) 02/03/2020  ? HGBA1C 7.5 (H) 10/07/2019  ? HGBA1C 6.3 (H) 12/26/2018  ? HGBA1C 6.6 (H) 05/22/2018  ? ?Lab Results  ?Component Value Date  ? INSULIN 26.8 (H) 06/01/2021  ? INSULIN 44.4 (H) 02/03/2020  ? INSULIN 28.1 (H) 12/26/2018  ? INSULIN 20.2 03/04/2018  ? INSULIN 53.9 (H) 11/20/2017  ? ?Lab Results  ?Component Value Date  ? TSH 0.506 04/23/2021  ? ?Lab Results  ?Component Value Date  ? CHOL 167 06/01/2021  ? HDL 49 06/01/2021  ? [place] 87 06/01/2021  ? TRIG 182 (H) 06/01/2021  ? CHOLHDL 4.2 04/23/2011  ? CHOLHDL 4.3 04/23/2011  ? ?Lab Results  ?Component Value Date  ? VD25OH 30.0 06/01/2021  ? VD25OH 44.6 02/03/2020  ? VD25OH 31.8 10/07/2019  ? ?Lab Results  ?Component Value Date  ? WBC 7.4 04/22/2021  ? HGB 12.5 04/22/2021  ? HCT 38.2 04/22/2021  ? MCV 87.8 04/22/2021  ? PLT 210 04/22/2021  ? ?No results found for: IRON, TIBC, FERRITIN ? ?Obesity Behavioral Intervention:  ? ?Approximately 15 minutes were spent on the discussion below. ? ?ASK: ?We discussed the diagnosis of obesity with [name] today and [name] agreed to give [name] permission to discuss obesity behavioral  modification therapy today. ? ?ASSESS: ?[name] has the diagnosis of obesity and her BMI today is 40.7. [name] is in the action stage of change.  ? ?ADVISE: ?[name] was educated on the multiple health risks of obesity as well as the benefit of weight loss to improve her health. She was advised of the need for long term treatment and the importance of lifestyle modifications to improve her current health and to decrease her risk of future health problems. ? ?AGREE: ?Multiple dietary modification options and treatment options were discussed and [name] agreed to follow the recommendations documented in the above note. ? ?ARRANGE: ?[name] was educated on the importance of frequent visits to treat obesity as outlined per CMS and USPSTF guidelines and agreed to schedule her next follow up appointment today. ? ?Attestation Statements:  ? ?Reviewed by clinician on day  of visit: allergies, medications, problem list, medical history, surgical history, family history, social history, and previous encounter notes. ? ? ?I, Trixie Dredge, am acting as transcriptionist for [employer], DO. ? ?I have reviewed the above documentation for accuracy and completeness, and I agree with the above. Marjory Sneddon, Barrera.O. ? ?The [place] was signed into law in 2016 which includes the topic of electronic health records.  This provides immediate access to information in MyChart.  This includes consultation notes, operative notes, office notes, lab results and pathology reports.  If you have any questions about what you read please let us know at your next visit so we can discuss your concerns and take corrective action if need be.  We are right here with you. ? ? ?

## 2021-08-19 MED ORDER — VITAMIN D (ERGOCALCIFEROL) 1.25 MG (50000 UNIT) PO CAPS: 50000.0000 [IU] | ORAL_CAPSULE | ORAL | 0 refills | Status: DC

## 2021-08-31 ENCOUNTER — Encounter (INDEPENDENT_AMBULATORY_CARE_PROVIDER_SITE_OTHER): Payer: Self-pay | Admitting: Family Medicine

## 2021-08-31 ENCOUNTER — Telehealth (INDEPENDENT_AMBULATORY_CARE_PROVIDER_SITE_OTHER): Payer: Medicare Other | Admitting: Family Medicine

## 2021-08-31 VITALS — BP 118/74 | HR 74 | Ht 59.0 in | Wt 196.0 lb

## 2021-08-31 DIAGNOSIS — I152 Hypertension secondary to endocrine disorders: Secondary | ICD-10-CM

## 2021-08-31 DIAGNOSIS — E1169 Type 2 diabetes mellitus with other specified complication: Secondary | ICD-10-CM

## 2021-08-31 DIAGNOSIS — E1159 Type 2 diabetes mellitus with other circulatory complications: Secondary | ICD-10-CM

## 2021-08-31 DIAGNOSIS — E1165 Type 2 diabetes mellitus with hyperglycemia: Secondary | ICD-10-CM

## 2021-08-31 DIAGNOSIS — E669 Obesity, unspecified: Secondary | ICD-10-CM

## 2021-08-31 DIAGNOSIS — F32A Depression, unspecified: Secondary | ICD-10-CM

## 2021-08-31 DIAGNOSIS — F419 Anxiety disorder, unspecified: Secondary | ICD-10-CM

## 2021-08-31 DIAGNOSIS — Z6839 Body mass index (BMI) 39.0-39.9, adult: Secondary | ICD-10-CM

## 2021-08-31 DIAGNOSIS — K219 Gastro-esophageal reflux disease without esophagitis: Secondary | ICD-10-CM | POA: Diagnosis not present

## 2021-08-31 MED ORDER — METFORMIN HCL 500 MG PO TABS: 500.0000 mg | ORAL_TABLET | Freq: Every day | ORAL | 0 refills | Status: DC

## 2021-08-31 MED ORDER — SERTRALINE HCL 25 MG PO TABS: 25.0000 mg | ORAL_TABLET | Freq: Every day | ORAL | 0 refills | Status: DC

## 2021-08-31 MED ORDER — OMEPRAZOLE 20 MG PO CPDR: 20.0000 mg | DELAYED_RELEASE_CAPSULE | Freq: Every day | ORAL | 0 refills | Status: DC

## 2021-09-01 ENCOUNTER — Telehealth (INDEPENDENT_AMBULATORY_CARE_PROVIDER_SITE_OTHER): Payer: Self-pay | Admitting: Family Medicine

## 2021-09-01 NOTE — Telephone Encounter (Signed)
Pt called asking which medications Dr. Jearld Shines told her to stop and which ones to take.  Pt had virtual with provider yesterday.

## 2021-09-01 NOTE — Telephone Encounter (Signed)
Dr.Ukleja 

## 2021-09-01 NOTE — Telephone Encounter (Signed)
Checking

## 2021-09-02 ENCOUNTER — Telehealth (INDEPENDENT_AMBULATORY_CARE_PROVIDER_SITE_OTHER): Payer: Self-pay

## 2021-09-02 ENCOUNTER — Telehealth: Payer: Self-pay | Admitting: Cardiovascular Disease

## 2021-09-02 DIAGNOSIS — I200 Unstable angina: Secondary | ICD-10-CM

## 2021-09-02 DIAGNOSIS — I10 Essential (primary) hypertension: Secondary | ICD-10-CM

## 2021-09-02 DIAGNOSIS — I2510 Atherosclerotic heart disease of native coronary artery without angina pectoris: Secondary | ICD-10-CM

## 2021-09-02 DIAGNOSIS — I5032 Chronic diastolic (congestive) heart failure: Secondary | ICD-10-CM

## 2021-09-02 NOTE — Telephone Encounter (Signed)
Called patient about her message. Patient stated her BP has been running low when she wakes up in the morning. SBP 80's and 90's, DBP 40's and 50's, HR 50's to 80's ?Patient stated she has been losing weight and making life style changes. Patient sees Dr. Jearld Shines with [facility]. Patient stated that she does not want to be on all these medications and she would like to get off some of her BP meds. Patient is taking Coreg 12 mg daily, lasix 60 mg daily, isosorbide dinitrate 20 mg BID, and lisinopril 20 mg daily. Consulted Dr. Tamala Julian, DOD, he recommend taking coreg 6.25 mg BID and isosorbide 20 mg daily. Patient is schedule to see Pharm D to help manage her heart medications. Will forward to Dr. Johnsie Cancel for any further advisement. ?

## 2021-09-02 NOTE — Telephone Encounter (Signed)
Pt c/o medication issue: ? ?1. Name of Medication:  ? ?2. How are you currently taking this medication (dosage and times per day)? As prescribed  ? ?3. Are you having a reaction (difficulty breathing--STAT)? Yes ? ?4. What is your medication issue?  ?Pt states she is apart of the program [facility] and they are requesting pt remove a BP med since her BP seems to be dropping in the mornings. She is requesting call back to find out which one should be removed.  ?

## 2021-09-02 NOTE — Telephone Encounter (Signed)
Spoke w/ pt 09/02/21, per Dr Jearld Shines, pt should reach out to cardiology to discuss her medications as her blood pressure is low. We were unaware that she was off her amlodipine. Pt agrees to call cardiology. ?

## 2021-09-03 ENCOUNTER — Other Ambulatory Visit (INDEPENDENT_AMBULATORY_CARE_PROVIDER_SITE_OTHER): Payer: Self-pay | Admitting: Family Medicine

## 2021-09-03 DIAGNOSIS — E1169 Type 2 diabetes mellitus with other specified complication: Secondary | ICD-10-CM

## 2021-09-05 NOTE — Progress Notes (Signed)
Patient ID: [name]                 DOB: 09-01-1960                      MRN: [id number] ? ? ? ? ?HPI: ?[name] is a 61 y.o. female referred by Dr. Johnsie Cancel to HTN clinic. PMH is significant for HFpEF, unstable angina, CAD, HTN, HLD, T2DM (A1c 8.4% on 03/08/21), obesity, PE (June 208), TIA, OSA on CPAP, RA. Pt currently following with [facility]. Has lost ~20 lbs since Oct-Nov per chart review, and is reporting hypotension (SBP 80s-90s, DBP 40s-50s) especially in the mornings. Clinic BP on 08/04/21 was 86/54, then on 5/8 was 118/74. Referred to PharmD for medication management.  ? ?Patient presents today for medication management in the setting of hypotension. Reports [facility] had wanted to stop her amlodipine previously when her BP was low, but she reports she had not been taking the amlodipine for a while. Also reports she was previously taking carvedilol 12.'5mg'$  once daily as that's how her rx was written. Primary care NP had been prescribing this. Since her recent phone call in, she has been cutting her tablet in half and taking 6.'25mg'$  BID. Did decrease Imdur frequency from BID to once daily. Headaches since then have resolved. Has not had any dizziness either. Denies LE edema. Takes AM meds at 9am. Reports home SBP 101, 110. DBP 55-60. HR 80. Uses a bicep cuff, has not brought to the office before for calibration. She is frustrated at the # of medications she needs to take. ? ?Current HTN meds: lisinopril 20 mg daily, carvedilol 6.25 mg (1/2 12.5 mg tablet) BID, furosemide 60 mg tablet daily, isosorbide dinitrate 20 mg daily ? ?Previously tried: amlodipine 10 mg daily (hypotension), olmesartan-HCTZ 40-12.5 mg daily (d/c 2016), carvedilol CR 10 mg daily (d/c 2021), clonidine 0.1 mg TID (d/c 2013), hydralazine 25 mg TID (d/c 2018). No intolerances to BP meds noted.  ?  ?BP goal: <130/80 mmHg ? ?Family History: DM (father, mother, sister), heart disease  (father, paternal grandfather), HF (mother), HLD (father, mother), HTN (father, mother), stroke (father, mother) ? ?Social History: 7.50 pack year smoking hx (quit >34 years ago). Denies alcohol use. Previous drug use: marijuana.  ? ?Diet: doesn't cook with salt and tries to avoid it. Occasional soda but no other caffeine, mainly drinks water. ? ?Exercise: occasional walking but has back issues ? ?Wt Readings from Last 3 Encounters:  ?08/31/21 196 lb (88.9 kg)  ?08/04/21 201 lb (91.2 kg)  ?07/11/21 201 lb (91.2 kg)  ? ?BP Readings from Last 3 Encounters:  ?08/31/21 118/74  ?08/04/21 (!) 86/54  ?07/11/21 99/65  ? ?Pulse Readings from Last 3 Encounters:  ?08/31/21 74  ?08/04/21 88  ?07/11/21 64  ? ? ?Renal function: ?CrCl cannot be calculated (Patient's most recent lab result is older than the maximum 21 days allowed.). ? ?Past Medical History:  ?Diagnosis Date  ? Allergy   ? Anginal pain ([place])   ? a. NL cath in 2008;  b. Myoview 03/2011: dec uptake along mid anterior wall on stress imaging -> ? attenuation vs. ischemia, EF 65%;  c. Echo 04/2011: EF 55-60%, no RWMA, Gr 2 dd  ? Anxiety   ? Arthritis   ? Asthma   ? Back pain   ? Bone cancer ([place])   ? Cancer [facility])   ? Chest pain   ? CHF (  congestive heart failure) ([place])   ? Clotting disorder ([place])   ? Constipation   ? Depression   ? Diabetes mellitus   ? Drug use   ? Dyspnea   ? Frequent urination   ? GERD (gastroesophageal reflux disease)   ? Glaucoma   ? History of stomach ulcers   ? HLD (hyperlipidemia)   ? Hypertension   ? IBS (irritable bowel syndrome)   ? Joint pain   ? Joint pain   ? Lactose intolerance   ? Leg edema   ? 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 Neuromuscular disorder ([place])   ? Obesity   ? Palpitations   ? Pneumonia 05/2016  ? double  ? Pulmonary edema   ? Pulmonary embolism ([place])   ? a. 2008 -> coumadin x 6 mos.  ? Rheumatoid arthritis ([place])   ? Sleep apnea   ? on CPAP 02/2018  ? SOB (shortness of breath)   ? Thyroid disease   ? TIA  (transient ischemic attack)   ? Urinary urgency   ? Vitamin D deficiency   ? ? ?Current Outpatient Medications on File Prior to Visit  ?Medication Sig Dispense Refill  ? albuterol (PROVENTIL HFA;VENTOLIN HFA) 108 (90 Base) MCG/ACT inhaler Inhale 2 puffs into the lungs every 6 (six) hours as needed for wheezing or shortness of breath.    ? aspirin 81 MG chewable tablet Chew 81 mg by mouth daily.    ? atorvastatin (LIPITOR) 40 MG tablet Take 40 mg by mouth at bedtime.     ? busPIRone (BUSPAR) 30 MG tablet Take 30 mg by mouth 2 (two) times daily.    ? carvedilol (COREG) 12.5 MG tablet Take 6.25 mg by mouth 2 (two) times daily.    ? diclofenac Sodium (VOLTAREN) 1 % GEL Apply 2 g topically 4 (four) times daily as needed (joint/muscle pain).    ? furosemide (LASIX) 40 MG tablet Take 60 mg by mouth daily.    ? hydrOXYzine (VISTARIL) 25 MG capsule Take 25 mg by mouth 3 (three) times daily as needed for itching.    ? isosorbide dinitrate (ISORDIL) 20 MG tablet Take 20 mg by mouth daily.    ? lidocaine (LIDODERM) 5 % Place 1 patch onto the skin daily as needed for pain.    ? lisinopril (PRINIVIL,ZESTRIL) 20 MG tablet Take 20 mg by mouth daily.     ? loperamide (IMODIUM A-D) 2 MG tablet Take 4 mg by mouth 4 (four) times daily as needed for diarrhea or loose stools.     ? meloxicam (MOBIC) 15 MG tablet meloxicam 15 mg tablet ? TAKE 1 TABLET (15 MG) BY ORAL ROUTE ONCE DAILY FOR 90 DAYS    ? metFORMIN (GLUCOPHAGE) 500 MG tablet Take 1 tablet (500 mg total) by mouth daily with breakfast. 30 tablet 0  ? MYRBETRIQ 25 MG TB24 tablet Take 1 tablet (25 mg total) by mouth daily. 30 tablet 0  ? nystatin (MYCOSTATIN/NYSTOP) powder Apply 1 application topically 3 (three) times daily as needed for rash.    ? omeprazole (PRILOSEC) 20 MG capsule Take 1 capsule (20 mg total) by mouth daily. 90 capsule 0  ? ondansetron (ZOFRAN) 4 MG tablet Take 1 tablet (4 mg total) by mouth every 6 (six) hours. (Patient taking differently: Take 4 mg by mouth  every 6 (six) hours as needed for nausea or vomiting.) 12 tablet 0  ? pantoprazole (PROTONIX) 40 MG tablet Take  1 tablet (40 mg total) by mouth 2 (two) times daily. 60 tablet 0  ? potassium chloride (KLOR-CON M10) 10 MEQ tablet Take 1 tablet (10 mEq total) by mouth 2 (two) times daily. 60 tablet 1  ? Semaglutide,0.25 or 0.'5MG'$ /DOS, (OZEMPIC, 0.25 OR 0.5 MG/DOSE,) 2 MG/1.5ML SOPN Inject 0.5 mg into the skin once a week. 1.5 mL 0  ? sertraline (ZOLOFT) 25 MG tablet Take 1 tablet (25 mg total) by mouth daily. 90 tablet 0  ? tiZANidine (ZANAFLEX) 4 MG tablet tizanidine 4 mg tablet ? TAKE 1 TABLET (4 MG) BY ORAL ROUTE EVERY 8 HOURS AS NEEDED FOR MUSCLE SPASM FOR 14 DAYS    ? Vitamin D, Ergocalciferol, (DRISDOL) 1.25 MG (50000 UNIT) CAPS capsule Take 1 capsule (50,000 Units total) by mouth every 7 (seven) days. 4 capsule 0  ? ?No current facility-administered medications on file prior to visit.  ? ? ?Allergies  ?Allergen Reactions  ? Penicillins Swelling and Other (See Comments)  ?  Has patient had a PCN reaction causing immediate rash, facial/tongue/throat swelling, SOB or lightheadedness with hypotension: YES ?Has patient had a PCN reaction causing severe rash involving mucus membranes or skin necrosis: NO ?Has patient had a PCN reaction that required hospitalization NO ?Has patient had a PCN reaction occurring within the last 10 years: NO ?If all of the above answers are "NO", then may proceed with Cephalosporin use.  ? Hydrocodone-Acetaminophen Itching  ?  confusion  ? Latex Rash  ? Sulfa Antibiotics Diarrhea, Itching and Rash  ? ? ? ?Assessment/Plan: ? ?1. Hypertension - BP improved and is at goal < 130/46mHg. Pt not having symptoms of low BP. Will continue current meds including carvedilol 6.'25mg'$  BID, lisinopril '20mg'$  daily, isosorbide '20mg'$  daily, and furosemide '60mg'$  daily. She voices frustration with the number of medications she takes. Discussed that her lisinopril is also beneficial given her DM, carvedilol given  her CAD, isosorbide given chest pain, and furosemide given diastolic CHF. Pt will continue to monitor BP at home and call if SBP < 100.  ? ?She is overdue for annual f/u with Dr NJohnsie Cancel will schedule f/u toda

## 2021-09-06 ENCOUNTER — Ambulatory Visit (INDEPENDENT_AMBULATORY_CARE_PROVIDER_SITE_OTHER): Payer: Medicare Other | Admitting: Pharmacist

## 2021-09-06 VITALS — BP 124/68 | HR 68 | Wt 199.0 lb

## 2021-09-06 DIAGNOSIS — I10 Essential (primary) hypertension: Secondary | ICD-10-CM

## 2021-09-06 MED ORDER — CARVEDILOL 6.25 MG PO TABS: 6.2500 mg | ORAL_TABLET | Freq: Two times a day (BID) | ORAL | 3 refills | Status: DC

## 2021-09-06 NOTE — Patient Instructions (Addendum)
It was nice to see you today ? ?Your blood pressure is at goal < 130/44mHg ? ?Continue using up your current supply of carvedilol (1/2 tablet of the 12.'5mg'$  twice daily. When you pick up your next refill, it will be for a lower 6.'25mg'$  dose to take 1 tablet twice daily ? ?Follow up with [facility] - your Ozempic medicine helps with weight loss and comes in 2 higher doses ? ?Schedule annual follow up with Dr NJohnsie Cancel?

## 2021-09-06 NOTE — Telephone Encounter (Signed)
Dr. Johnsie Cancel is fine with plan. ?

## 2021-09-07 NOTE — Progress Notes (Signed)
TeleHealth Visit:  Due to the COVID-19 pandemic, this visit was completed with telemedicine (audio/video) technology to reduce patient and provider exposure as well as to preserve personal protective equipment.   [name] has verbally consented to this TeleHealth visit. The patient is located at home, the provider is located at home. The participants in this visit include the listed provider and patient. The visit was conducted today via Mychart video.   Chief Complaint: OBESITY [name] is here to discuss her progress with her obesity treatment plan along with follow-up of her obesity related diagnoses. [name] is on the Category 2 Plan and states she is following her eating plan approximately 80-85% of the time. [name] states she is exercising 0 minutes 0 times per week.  Today's visit was #: 4 Starting weight: 209 lbs Starting date: 06/01/2021  Interim History: [name] has been feeling somewhat stressed with pain she has in her neck and back. She has not been able to walk but is trying to focus on getting all food in. Notices she is not getting all food in at all meals. She feels hungry occasionally, using crackers, cheese,  and yogurt for snacks.  Subjective:   1. Hypertension associated with diabetes ([place]) [name]'s blood pressure low in office today, 87/47, 96/63 then 118/74. She notes diziness and fatigue.  2. Type 2 diabetes mellitus with hyperglycemia, without long-term current use of insulin ([place]) [name]'s last A1c of 8.4 in Nov 2022, she is currently taking Metformin. Denies GI side effects.  3. Gastroesophageal reflux disease, unspecified whether esophagitis present Symptoms well controlled. She is currently taking Prilosec 20 mg daily.  4. Anxiety and depression [name] is currently taking Sertraline with good symptom control and improvement. Denies suicidal ideas, and homicidal ideas.  Assessment/Plan:   1. Hypertension associated with diabetes ([place]) Shavaughn will stop  Amlodipine. She is to Mychart if blood pressure stays low.  2. Type 2 diabetes mellitus with hyperglycemia, without long-term current use of insulin ([facility]) We will refill Metformin 500 mg by mouth daily, for 1 month with zero refills. [name] will need labs at next appointment.  -Refill metFORMIN (GLUCOPHAGE) 500 MG tablet; Take 1 tablet (500 mg total) by mouth daily with breakfast.  Dispense: 30 tablet; Refill: 0  3. Gastroesophageal reflux disease, unspecified whether esophagitis present We will refill Omeprazole 20 mg daily for 3 months with zero refills.  -Refill omeprazole (PRILOSEC) 20 MG capsule; Take 1 capsule (20 mg total) by mouth daily.  Dispense: 90 capsule; Refill: 0  4. Anxiety and depression We will refill Sertraline 25 mg by mouth for 3 months with zero refills.  -Refill sertraline (ZOLOFT) 25 MG tablet; Take 1 tablet (25 mg total) by mouth daily.  Dispense: 90 tablet; Refill: 0  5. Obesity with current BMI of 39.7 [name] is currently in the action stage of change. As such, her goal is to continue with weight loss efforts. She has agreed to the Category 2 Plan.   Exercise goals: All adults should avoid inactivity. Some physical activity is better than none, and adults who participate in any amount of physical activity gain some health benefits.  Behavioral modification strategies: increasing lean protein intake, no skipping meals, and meal planning and cooking strategies.  [name] has agreed to follow-up with our clinic in 4 weeks. She was informed of the importance of frequent follow-up visits to maximize her success with intensive lifestyle modifications for her multiple health conditions.  Objective:   VITALS: Per patient if applicable, see vitals. GENERAL:  Alert and in no acute distress. CARDIOPULMONARY: No increased WOB. Speaking in clear sentences.  PSYCH: Pleasant and cooperative. Speech normal rate and rhythm. Affect is appropriate. Insight and judgement are  appropriate. Attention is focused, linear, and appropriate.  NEURO: Oriented as arrived to appointment on time with no prompting.   Lab Results  Component Value Date   CREATININE 1.1 06/22/2021   BUN 22 (A) 06/22/2021   NA 143 06/22/2021   K 3.9 06/22/2021   CL 104 06/22/2021   CO2 29 (A) 06/22/2021   Lab Results  Component Value Date   ALT 20 06/22/2021   AST 16 06/22/2021   ALKPHOS 50 06/22/2021   BILITOT 0.6 03/09/2021   Lab Results  Component Value Date   HGBA1C 8.4 (H) 03/08/2021   HGBA1C 7.6 (H) 02/03/2020   HGBA1C 7.5 (H) 10/07/2019   HGBA1C 6.3 (H) 12/26/2018   HGBA1C 6.6 (H) 05/22/2018   Lab Results  Component Value Date   INSULIN 26.8 (H) 06/01/2021   INSULIN 44.4 (H) 02/03/2020   INSULIN 28.1 (H) 12/26/2018   INSULIN 20.2 03/04/2018   INSULIN 53.9 (H) 11/20/2017   Lab Results  Component Value Date   TSH 0.506 04/23/2021   Lab Results  Component Value Date   CHOL 163 06/22/2021   HDL 51 06/22/2021   LDLCALC 87 06/22/2021   TRIG 150 06/22/2021   CHOLHDL 4.2 04/23/2011   CHOLHDL 4.3 04/23/2011   Lab Results  Component Value Date   VD25OH 30.0 06/01/2021   VD25OH 44.6 02/03/2020   VD25OH 31.8 10/07/2019   Lab Results  Component Value Date   WBC 6.3 06/22/2021   HGB 13.3 06/22/2021   HCT 40 06/22/2021   MCV 87.8 04/22/2021   PLT 210 06/22/2021   No results found for: IRON, TIBC, FERRITIN  Attestation Statements:   Reviewed by clinician on day of visit: allergies, medications, problem list, medical history, surgical history, family history, social history, and previous encounter notes.  I, Elnora Morrison, RMA am acting as transcriptionist for Coralie Common, MD.  I have reviewed the above documentation for accuracy and completeness, and I agree with the above. - Coralie Common, MD

## 2021-09-12 ENCOUNTER — Telehealth: Payer: Self-pay | Admitting: Cardiovascular Disease
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
## 2021-09-12 NOTE — Telephone Encounter (Signed)
Pt c/o BP issue: STAT if pt c/o blurred vision, one-sided weakness or slurred speech  1. What are your last 5 BP readings?  91/57   2. Are you having any other symptoms (ex. Dizziness, headache, blurred vision, passed out)? Headaches and tired  3. What is your BP issue? Pt states that 2 of her medications were cut in half and thinks that that may be the cause of BP running low.  Medications are    carvedilol (COREG) 6.25 MG tablet    isosorbide dinitrate (ISORDIL) 20 MG tablet    Please advise

## 2021-09-12 NOTE — Telephone Encounter (Signed)
Patient saw Fuller Canada, Pharm D last week and discussed medications for BP. Patient was to call the office if her SBP < 100. Will forward to Pharmacist for advisement.

## 2021-09-12 NOTE — Telephone Encounter (Signed)
BP meds were reduced due to her low BP, decreasing these doses would not make her BP drop low. BP was normal when she was just seen in the office last week. She has not brought her cuff into the office for validation either. Would see if she has other low BP readings as well or if this is a single low reading. Since she has been symptomatic (denied at visit last week), would be ok to decrease her lisinopril from '20mg'$  to '10mg'$  daily. She should keep an eye on her BP and bring in her home cuff to her annual visit with Dr Johnsie Cancel in a few weeks to confirm it's measuring accurately, and should also keep an eye on her BP in the mean time.

## 2021-09-12 NOTE — Telephone Encounter (Signed)
Left message for patient to call back  
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
## 2021-09-27 ENCOUNTER — Emergency Department (HOSPITAL_COMMUNITY): Payer: Medicare Other

## 2021-09-27 ENCOUNTER — Other Ambulatory Visit: Payer: Self-pay

## 2021-09-27 ENCOUNTER — Encounter (HOSPITAL_COMMUNITY): Payer: Self-pay

## 2021-09-27 ENCOUNTER — Emergency Department (HOSPITAL_COMMUNITY)
Admission: EM | Admit: 2021-09-27 | Discharge: 2021-09-27 | Disposition: A | Discharge status: Home / Self Care | Payer: Medicare Other | Attending: Emergency Medicine | Admitting: Emergency Medicine

## 2021-09-27 DIAGNOSIS — I10 Essential (primary) hypertension: Secondary | ICD-10-CM | POA: Insufficient documentation

## 2021-09-27 DIAGNOSIS — Z79899 Other long term (current) drug therapy: Secondary | ICD-10-CM | POA: Insufficient documentation

## 2021-09-27 DIAGNOSIS — Z7982 Long term (current) use of aspirin: Secondary | ICD-10-CM | POA: Insufficient documentation

## 2021-09-27 DIAGNOSIS — Z91040 Latex allergy status: Secondary | ICD-10-CM | POA: Insufficient documentation

## 2021-09-27 DIAGNOSIS — R079 Chest pain, unspecified: Secondary | ICD-10-CM | POA: Insufficient documentation

## 2021-09-27 DIAGNOSIS — I2510 Atherosclerotic heart disease of native coronary artery without angina pectoris: Secondary | ICD-10-CM | POA: Diagnosis not present

## 2021-09-27 LAB — BASIC METABOLIC PANEL
Anion gap: 10 (ref 5–15)
BUN: 17 mg/dL (ref 8–23)
CO2: 24 mmol/L (ref 22–32)
Calcium: 9.2 mg/dL (ref 8.9–10.3)
Chloride: 107 mmol/L (ref 98–111)
Creatinine, Ser: 1.08 mg/dL — ABNORMAL HIGH (ref 0.44–1.00)
GFR, Estimated: 58 mL/min — ABNORMAL LOW (ref >60)
Glucose, Bld: 130 mg/dL — ABNORMAL HIGH (ref 70–99)
Potassium: 3.2 mmol/L — ABNORMAL LOW (ref 3.5–5.1)
Sodium: 141 mmol/L (ref 135–145)

## 2021-09-27 LAB — CBC
HCT: 38.9 % (ref 36.0–46.0)
Hemoglobin: 12.7 g/dL (ref 12.0–15.0)
MCH: 29.5 pg (ref 26.0–34.0)
MCHC: 32.6 g/dL (ref 30.0–36.0)
MCV: 90.3 fL (ref 80.0–100.0)
Platelets: 241 10*3/uL (ref 150–400)
RBC: 4.31 MIL/uL (ref 3.87–5.11)
RDW: 12.7 % (ref 11.5–15.5)
WBC: 6.8 10*3/uL (ref 4.0–10.5)
nRBC: 0 % (ref 0.0–0.2)

## 2021-09-27 LAB — TROPONIN I (HIGH SENSITIVITY)
Troponin I (High Sensitivity): 8 ng/L (ref <18)
Troponin I (High Sensitivity): 8 ng/L (ref <18)

## 2021-09-27 MED ORDER — IOHEXOL 350 MG/ML SOLN
100.0000 mL | Freq: Once | INTRAVENOUS | Status: AC | PRN
  Administered 2021-09-27: 70 mL via INTRAVENOUS

## 2021-09-27 NOTE — ED Provider Notes (Signed)
[facility] EMERGENCY DEPARTMENT Provider Note   CSN: [id number] Arrival date & time: 09/27/21  0103     History  Chief Complaint  Patient presents with   Chest Pain    [name] is a 61 y.o. female.  HPI 61 year old female history of hypertension, coronary artery disease presents today complaining of chest pain that began at approximately 5:00 yesterday evening.  The pain has been constant in nature.  The pain is in the left chest radiates through the back.  She describes this is similar to her prior pain.  The pain is moderate.  Pain has been constant.  She reports taking her medications as prescribed.  Review of her prior clinic notes reveal that she has had decreasing medications for her blood pressure due to weight loss.  Patient reports taking her medications as prescribed.    Home Medications Prior to Admission medications   Medication Sig Start Date End Date Taking? Authorizing Provider  albuterol (PROVENTIL HFA;VENTOLIN HFA) 108 (90 Base) MCG/ACT inhaler Inhale 2 puffs into the lungs every 6 (six) hours as needed for wheezing or shortness of breath.    [provider]  aspirin 81 MG chewable tablet Chew 81 mg by mouth daily.    [provider]  atorvastatin (LIPITOR) 40 MG tablet Take 40 mg by mouth at bedtime.     [provider]  busPIRone (BUSPAR) 30 MG tablet Take 30 mg by mouth 2 (two) times daily. 12/18/19   [provider]  carvedilol (COREG) 6.25 MG tablet Take 1 tablet (6.25 mg total) by mouth 2 (two) times daily. 09/06/21   Josue Hector, MD  diclofenac Sodium (VOLTAREN) 1 % GEL Apply 2 g topically 4 (four) times daily as needed (joint/muscle pain). 09/20/19   [provider]  furosemide (LASIX) 40 MG tablet Take 60 mg by mouth daily.    Rai, Vernelle Emerald, MD  hydrOXYzine (VISTARIL) 25 MG capsule Take 25 mg by mouth 3 (three) times daily as needed for itching.    [provider]   isosorbide dinitrate (ISORDIL) 20 MG tablet Take 20 mg by mouth daily.    [provider]  lidocaine (LIDODERM) 5 % Place 1 patch onto the skin daily as needed for pain. 02/11/21   [provider]  lisinopril (PRINIVIL,ZESTRIL) 20 MG tablet Take 20 mg by mouth daily.  09/21/14   [provider]  loperamide (IMODIUM A-D) 2 MG tablet Take 4 mg by mouth 4 (four) times daily as needed for diarrhea or loose stools.     [provider]  meloxicam (MOBIC) 15 MG tablet meloxicam 15 mg tablet  TAKE 1 TABLET (15 MG) BY ORAL ROUTE ONCE DAILY FOR 90 DAYS    [provider]  metFORMIN (GLUCOPHAGE) 500 MG tablet Take 1 tablet (500 mg total) by mouth daily with breakfast. 08/31/21   Laqueta Linden, MD  MYRBETRIQ 25 MG TB24 tablet Take 1 tablet (25 mg total) by mouth daily. 03/13/19   Dennard Nip D, MD  nystatin (MYCOSTATIN/NYSTOP) powder Apply 1 application topically 3 (three) times daily as needed for rash.    [provider]  omeprazole (PRILOSEC) 20 MG capsule Take 1 capsule (20 mg total) by mouth daily. 08/31/21   Laqueta Linden, MD  ondansetron (ZOFRAN) 4 MG tablet Take 1 tablet (4 mg total) by mouth every 6 (six) hours. Patient taking differently: Take 4 mg by mouth every 6 (six) hours as needed for nausea or  vomiting. 09/03/20   Blanchie Dessert, MD  pantoprazole (PROTONIX) 40 MG tablet Take 1 tablet (40 mg total) by mouth 2 (two) times daily. 03/09/21   Domenic Polite, MD  potassium chloride (KLOR-CON M10) 10 MEQ tablet Take 1 tablet (10 mEq total) by mouth 2 (two) times daily. 05/27/18   Jearld Lesch A, DO  Semaglutide,0.25 or 0.'5MG'$ /DOS, (OZEMPIC, 0.25 OR 0.5 MG/DOSE,) 2 MG/1.5ML SOPN Inject 0.5 mg into the skin once a week. 08/04/21   Opalski, Neoma Laming, DO  sertraline (ZOLOFT) 25 MG tablet Take 1 tablet (25 mg total) by mouth daily. 08/31/21   Laqueta Linden, MD  tiZANidine (ZANAFLEX) 4 MG tablet tizanidine 4 mg tablet  TAKE 1 TABLET  (4 MG) BY ORAL ROUTE EVERY 8 HOURS AS NEEDED FOR MUSCLE SPASM FOR 14 DAYS    [provider]  Vitamin D, Ergocalciferol, (DRISDOL) 1.25 MG (50000 UNIT) CAPS capsule Take 1 capsule (50,000 Units total) by mouth every 7 (seven) days. 08/19/21   Mellody Dance, DO      Allergies    Penicillins, Hydrocodone-acetaminophen, Latex, and Sulfa antibiotics    Review of Systems   Review of Systems  Physical Exam Updated Vital Signs BP 116/70 (BP Location: Left Arm)   Pulse 63   Temp 97.8 F (36.6 C) (Oral)   Resp 18   SpO2 98%  Physical Exam Vitals and nursing note reviewed.  Constitutional:      Appearance: She is well-developed. She is obese.  Eyes:     Pupils: Pupils are equal, round, and reactive to light.  Cardiovascular:     Rate and Rhythm: Normal rate and regular rhythm.     Heart sounds: Normal heart sounds.  Pulmonary:     Effort: Pulmonary effort is normal.     Breath sounds: Normal breath sounds.  Abdominal:     General: Bowel sounds are normal.     Palpations: Abdomen is soft.  Musculoskeletal:        General: Normal range of motion.     Cervical back: Normal range of motion.     Right lower leg: No edema.     Left lower leg: No edema.     Comments: Boot in place lle- patient reports "hairline" fracture Trace edema bilaterally   Skin:    General: Skin is warm and dry.     Capillary Refill: Capillary refill takes less than 2 seconds.  Neurological:     General: No focal deficit present.     Mental Status: She is alert.  Psychiatric:        Mood and Affect: Mood normal.    ED Results / Procedures / Treatments   Labs (all labs ordered are listed, but only abnormal results are displayed) Labs Reviewed  BASIC METABOLIC PANEL - Abnormal; Notable for the following components:      Result Value   Potassium 3.2 (*)    Glucose, Bld 130 (*)    Creatinine, Ser 1.08 (*)    GFR, Estimated 58 (*)    All other components within normal limits  CBC  TROPONIN I  (HIGH SENSITIVITY)  TROPONIN I (HIGH SENSITIVITY)    EKG EKG Interpretation  Date/Time:  Tuesday September 27 2021 01:12:11 EDT Ventricular Rate:  72 PR Interval:  190 QRS Duration: 108 QT Interval:  402 QTC Calculation: 440 R Axis:   81 Text Interpretation: Normal sinus rhythm Incomplete left bundle branch block Borderline ECG When compared with ECG of 22-Apr-2021 20:32, No significant change since last  tracing Confirmed by Pattricia Boss [phone]) on 09/27/2021 7:26:36 AM  Radiology DG Chest 2 View  Result Date: 09/27/2021 CLINICAL DATA:  Chest pain EXAM: CHEST - 2 VIEW COMPARISON:  04/29/2021 FINDINGS: Cardiac shadow is stable. The lungs are well aerated bilaterally. No focal infiltrate or sizable effusion is seen. No bony abnormality is noted. IMPRESSION: No acute abnormality seen. Electronically Signed   By: Inez Catalina M.D.   On: 09/27/2021 01:56    Procedures Procedures    Medications Ordered in ED Medications - No data to display  ED Course/ Medical Decision Making/ A&P Clinical Course as of 09/27/21 0851  Tue Sep 27, 2021  0850 CTA reviewed interpreted no evidence of acute PE noted on my review Radiologist interpretation shows no evidence of pulmonary embolism scattered peribronchial thickening, chronic enlargement of the right thyroid lobe noted on prior previous and assessed by ultrasound atherosclerosis noted [DR]    Clinical Course User Index [DR] Pattricia Boss, MD                           Medical Decision Making Patient seen and evaluated for chest pain.  Differential diagnosis of serious/life threatening causes of chest pain includes ACS, other diseases of the heart such as myocarditis or pericarditis, lung etiologies such as infection or pneumothorax, diseases of the great vessels such as aortic dissection or AAA, pulmonary embolism, or GI sources such as cholecystitis or other upper abdominal causes. Doubt ACS- heart score documented, EKG reviewed, Given the timing of  pain to ER presentation, delta troponin8 was 8 so doubt NSTEMI troponin and repeat troponin obtained and WNL Doubt myocarditis/pericarditis/tamponade based on history, review of ekg and labs Doubt aortic dissection based on history and review of imaging Doubt intrinsic lung causes such as pneumonia or pneumothorax, based on history, physical exam, and studies obtained. Doubt PE based on history, physical exam, and PERC Doubt acute GI etiology requiring intervention based on history, physical exam and labs. Patient appears stable for discharge. Return precautions and need for follow up discussed and patient voices understanding   Amount and/or Complexity of Data Reviewed External Data Reviewed: labs, ECG and notes. Labs: ordered. Decision-making details documented in ED Course. Radiology: ordered and independent interpretation performed. Decision-making details documented in ED Course. ECG/medicine tests: ordered and independent interpretation performed. Decision-making details documented in ED Course.  Risk Prescription drug management.         Final Clinical Impression(s) / ED Diagnoses Final diagnoses:  None    Rx / DC Orders ED Discharge Orders     None         Pattricia Boss, MD 09/27/21 [phone]

## 2021-09-27 NOTE — Discharge Instructions (Signed)
Your heart tracing is unchanged from earlier tracings Your heart enzymes are normal. CT of your chest reveals no evidence of blood clots or dissection of your aorta. There is a thyroid lobe enlargement that appears to been previously evaluated. Please call your heart doctor today to get in for reevaluation Please return if you are having worsening or new symptoms

## 2021-09-27 NOTE — ED Triage Notes (Signed)
Pt is here today due to chest pain since afternoon. Pt reports that her bp was running low, cp,dizziness. Pt reports they recently decrease her bp meds but is still having hypotension when she wakes up.

## 2021-09-27 NOTE — ED Provider Triage Note (Signed)
Emergency Medicine Provider Triage Evaluation Note  [name] , a 62 y.o. female  was evaluated in triage.  Pt complains of Pressure and discomfort is associated lightheadednesscentral chest, shortness of breath, and "feeling bad" that started this evening.  Patient has history of transient hypotension with recent decreases in all of her blood pressure medications including carvedilol..  Review of Systems  Positive: Chest pain with shortness of breath, lightheadedness Negative: Nausea vomiting, syncope, palpitation  Physical Exam  BP (!) 101/55   Pulse 72   Temp 97.8 F (36.6 C) (Oral)   Resp (!) 22   SpO2 94%  Gen:   Awake, no distress   Resp:  Normal effort  MSK:   Moves extremities without difficulty  Other:  RRR no M setters G.  Lungs CTA B.  Medical Decision Making  Medically screening exam initiated at 1:46 AM.  Appropriate orders placed.  [name] was informed that the remainder of the evaluation will be completed by another provider, this initial triage assessment does not replace that evaluation, and the importance of remaining in the ED until their evaluation is complete.  This chart was dictated using voice recognition software, Dragon. Despite the best efforts of this provider to proofread and correct errors, errors may still occur which can change documentation meaning.    Emeline Darling, PA-C 09/27/21 0148

## 2021-09-28 ENCOUNTER — Ambulatory Visit (INDEPENDENT_AMBULATORY_CARE_PROVIDER_SITE_OTHER): Payer: Medicare Other | Admitting: Family Medicine

## 2021-09-28 ENCOUNTER — Encounter (INDEPENDENT_AMBULATORY_CARE_PROVIDER_SITE_OTHER): Payer: Self-pay | Admitting: Family Medicine

## 2021-09-28 VITALS — BP 99/65 | HR 75 | Temp 97.8°F | Ht 59.0 in | Wt 195.0 lb

## 2021-09-28 DIAGNOSIS — E1169 Type 2 diabetes mellitus with other specified complication: Secondary | ICD-10-CM

## 2021-09-28 DIAGNOSIS — F32A Depression, unspecified: Secondary | ICD-10-CM

## 2021-09-28 DIAGNOSIS — Z7984 Long term (current) use of oral hypoglycemic drugs: Secondary | ICD-10-CM

## 2021-09-28 DIAGNOSIS — I152 Hypertension secondary to endocrine disorders: Secondary | ICD-10-CM

## 2021-09-28 DIAGNOSIS — E1165 Type 2 diabetes mellitus with hyperglycemia: Secondary | ICD-10-CM | POA: Diagnosis not present

## 2021-09-28 DIAGNOSIS — E1159 Type 2 diabetes mellitus with other circulatory complications: Secondary | ICD-10-CM

## 2021-09-28 DIAGNOSIS — F419 Anxiety disorder, unspecified: Secondary | ICD-10-CM

## 2021-09-28 DIAGNOSIS — Z7985 Long-term (current) use of injectable non-insulin antidiabetic drugs: Secondary | ICD-10-CM

## 2021-09-28 DIAGNOSIS — E559 Vitamin D deficiency, unspecified: Secondary | ICD-10-CM

## 2021-09-28 DIAGNOSIS — Z6839 Body mass index (BMI) 39.0-39.9, adult: Secondary | ICD-10-CM

## 2021-09-28 DIAGNOSIS — E669 Obesity, unspecified: Secondary | ICD-10-CM

## 2021-09-28 DIAGNOSIS — E785 Hyperlipidemia, unspecified: Secondary | ICD-10-CM

## 2021-09-28 MED ORDER — METFORMIN HCL 500 MG PO TABS: 500.0000 mg | ORAL_TABLET | Freq: Every day | ORAL | 0 refills | Status: DC

## 2021-09-28 MED ORDER — SERTRALINE HCL 25 MG PO TABS: 25.0000 mg | ORAL_TABLET | Freq: Every day | ORAL | 0 refills | Status: DC

## 2021-09-28 MED ORDER — OZEMPIC (0.25 OR 0.5 MG/DOSE) 2 MG/1.5ML ~~LOC~~ SOPN: 0.5000 mg | PEN_INJECTOR | SUBCUTANEOUS | 0 refills | Status: DC

## 2021-09-29 ENCOUNTER — Other Ambulatory Visit (INDEPENDENT_AMBULATORY_CARE_PROVIDER_SITE_OTHER): Payer: Self-pay | Admitting: Family Medicine

## 2021-09-29 DIAGNOSIS — E1165 Type 2 diabetes mellitus with hyperglycemia: Secondary | ICD-10-CM

## 2021-09-29 LAB — COMPREHENSIVE METABOLIC PANEL
ALT: 20 IU/L (ref 0–32)
AST: 16 IU/L (ref 0–40)
Albumin/Globulin Ratio: 1.9 (ref 1.2–2.2)
Albumin: 4.3 g/dL (ref 3.8–4.8)
Alkaline Phosphatase: 64 IU/L (ref 44–121)
BUN/Creatinine Ratio: 14 (ref 12–28)
BUN: 15 mg/dL (ref 8–27)
Bilirubin Total: 0.3 mg/dL (ref 0.0–1.2)
CO2: 26 mmol/L (ref 20–29)
Calcium: 9.4 mg/dL (ref 8.7–10.3)
Chloride: 104 mmol/L (ref 96–106)
Creatinine, Ser: 1.08 mg/dL — ABNORMAL HIGH (ref 0.57–1.00)
Globulin, Total: 2.3 g/dL (ref 1.5–4.5)
Glucose: 115 mg/dL — ABNORMAL HIGH (ref 70–99)
Potassium: 4 mmol/L (ref 3.5–5.2)
Sodium: 144 mmol/L (ref 134–144)
Total Protein: 6.6 g/dL (ref 6.0–8.5)
eGFR: 58 mL/min/{1.73_m2} — ABNORMAL LOW (ref >59)

## 2021-09-29 LAB — LIPID PANEL WITH LDL/HDL RATIO
Cholesterol, Total: 127 mg/dL (ref 100–199)
HDL: 48 mg/dL (ref >39)
LDL Chol Calc (NIH): 60 mg/dL (ref 0–99)
LDL/HDL Ratio: 1.3 ratio (ref 0.0–3.2)
Triglycerides: 99 mg/dL (ref 0–149)
VLDL Cholesterol Cal: 19 mg/dL (ref 5–40)

## 2021-09-29 LAB — HEMOGLOBIN A1C
Est. average glucose Bld gHb Est-mCnc: 137 mg/dL
Hgb A1c MFr Bld: 6.4 % — ABNORMAL HIGH (ref 4.8–5.6)

## 2021-09-29 LAB — VITAMIN D 25 HYDROXY (VIT D DEFICIENCY, FRACTURES): Vit D, 25-Hydroxy: 39.8 ng/mL (ref 30.0–100.0)

## 2021-09-29 LAB — INSULIN, RANDOM: INSULIN: 22.1 u[IU]/mL (ref 2.6–24.9)

## 2021-09-30 ENCOUNTER — Encounter: Payer: Self-pay | Admitting: Cardiovascular Disease

## 2021-09-30 ENCOUNTER — Ambulatory Visit (INDEPENDENT_AMBULATORY_CARE_PROVIDER_SITE_OTHER): Payer: Medicare Other | Admitting: Cardiovascular Disease

## 2021-09-30 VITALS — BP 110/72 | HR 76 | Ht 59.0 in | Wt 195.0 lb

## 2021-09-30 DIAGNOSIS — I5032 Chronic diastolic (congestive) heart failure: Secondary | ICD-10-CM

## 2021-09-30 DIAGNOSIS — I10 Essential (primary) hypertension: Secondary | ICD-10-CM | POA: Diagnosis not present

## 2021-09-30 NOTE — Patient Instructions (Signed)
Medication Instructions:  Your physician recommends that you continue on your current medications as directed. Please refer to the Current Medication list given to you today.  *If you need a refill on your cardiac medications before your next appointment, please call your pharmacy*  Lab Work: If you have labs (blood work) drawn today and your tests are completely normal, you will receive your results only by: MyChart Message (if you have MyChart) OR A paper copy in the mail If you have any lab test that is abnormal or we need to change your treatment, we will call you to review the results.  Testing/Procedures: None ordered today.  Follow-Up: At [facility], you and your health needs are our priority.  As part of our continuing mission to provide you with exceptional heart care, we have created designated Provider Care Teams.  These Care Teams include your primary Cardiologist (physician) and Advanced Practice Providers (APPs -  Physician Assistants and Nurse Practitioners) who all work together to provide you with the care you need, when you need it.  We recommend signing up for the patient portal called "MyChart".  Sign up information is provided on this After Visit Summary.  MyChart is used to connect with patients for Virtual Visits (Telemedicine).  Patients are able to view lab/test results, encounter notes, upcoming appointments, etc.  Non-urgent messages can be sent to your provider as well.   To learn more about what you can do with MyChart, go to [email or web address].    Your next appointment:   1 year(s)  The format for your next appointment:   In Person  Provider:   Peter Nishan, MD {   Important Information About Sugar       

## 2021-09-30 NOTE — Progress Notes (Unsigned)
Chief Complaint:   OBESITY [name] is here to discuss her progress with her obesity treatment plan along with follow-up of her obesity related diagnoses. [name] is on the Category 2 Plan and states she is following her eating plan approximately 90% of the time. [name] states she is walking 20 minutes 3 times per week.  Today's visit was #: 5 Starting weight: 209 lbs Starting date: 06/01/2021 Today's weight: 195 lbs Today's date: 09/28/2021 Total lbs lost to date: 14 lbs Total lbs lost since last in-office visit: 6   Interim History: [name] has seen cardiology pharmacist since last appointment. She was without Ozempic last week. Foodwise she has been following Cat 2 but has experienced some hunger 2.5-3 hours after eating. For snacks she is eating melons, cheese, and crackers. Sometimes she skips protein at supper.  Subjective:   1. Type 2 diabetes mellitus with hyperglycemia, without long-term current use of insulin (HCC) [name] is currently taking Metformin and Ozempic. Her last A1c at 8.4.  2. Hypertension associated with diabetes ([place]) [name]'s blood pressure is well managed today. She has an appointment with Dr. Johnsie Cancel in upcoming few weeks.  3. Hyperlipidemia associated with type 2 diabetes mellitus ([place]) [name] is currently taking Lipitor with no myalgias or transaminitis.  4. Vitamin D deficiency [name] is not taking prescription Vit D but an over the counter Vit D. She notes fatigue.  5. Anxiety and depression [name] is currently taking Sertraline with good improvement in symptoms. Denies suicidal ideas, and homicidal ideas.  Assessment/Plan:   1. Type 2 diabetes mellitus with hyperglycemia, without long-term current use of insulin (HCC) We will refill Ozempic 0.5 mg subcutaneous once weekly AND Metformin 500 mg by mouth daily for 1 month with 0 refills.  -Refill Semaglutide,0.25 or 0.'5MG'$ /DOS, (OZEMPIC, 0.25 OR 0.5 MG/DOSE,) 2 MG/1.5ML SOPN; Inject 0.5 mg into the  skin once a week.  Dispense: 3 mL; Refill: 0  -Refill metFORMIN (GLUCOPHAGE) 500 MG tablet; Take 1 tablet (500 mg total) by mouth daily with breakfast.  Dispense: 30 tablet; Refill: 0  We will obtain labs today. - Hemoglobin A1c - Insulin, random  2. Hypertension associated with diabetes ([place]) We will obtain labs today.  - Comprehensive metabolic panel  3. Hyperlipidemia associated with type 2 diabetes mellitus ([place]) We will obtain labs today.  - Lipid Panel With LDL/HDL Ratio  4. Vitamin D deficiency We will obtain labs today.  - VITAMIN D 25 Hydroxy (Vit-D Deficiency, Fractures)  5. Anxiety and depression We will refill Sertraline 25 mg by mouth daily for 3 months with 0 refills.  -Refill sertraline (ZOLOFT) 25 MG tablet; Take 1 tablet (25 mg total) by mouth daily.  Dispense: 90 tablet; Refill: 0  6. Obesity with current BMI of 39.5 [name] is currently in the action stage of change. As such, her goal is to continue with weight loss efforts. She has agreed to the Category 2 Plan and keeping a food journal and adhering to recommended goals of 400-500 calories and 35+ grams of protein at supper.  Exercise goals: All adults should avoid inactivity. Some physical activity is better than none, and adults who participate in any amount of physical activity gain some health benefits.  Behavioral modification strategies: increasing lean protein intake, meal planning and cooking strategies, keeping healthy foods in the home, and planning for success.  [name] has agreed to follow-up with our clinic in 3 weeks. She was informed of the importance of frequent follow-up visits to maximize her success  with intensive lifestyle modifications for her multiple health conditions.   [name] was informed we would discuss her lab results at her next visit unless there is a critical issue that needs to be addressed sooner. [name] agreed to keep her next visit at the agreed upon time to discuss these  results.  Objective:   Blood pressure 99/65, pulse 75, temperature 97.8 F (36.6 C), height '4\' 11"'$  (1.499 m), weight 195 lb (88.5 kg), SpO2 96 %. Body mass index is 39.39 kg/m.  General: Cooperative, alert, well developed, in no acute distress. HEENT: Conjunctivae and lids unremarkable. Cardiovascular: Regular rhythm.  Lungs: Normal work of breathing. Neurologic: No focal deficits.   Lab Results  Component Value Date   CREATININE 1.08 (H) 09/28/2021   BUN 15 09/28/2021   NA 144 09/28/2021   K 4.0 09/28/2021   CL 104 09/28/2021   CO2 26 09/28/2021   Lab Results  Component Value Date   ALT 20 09/28/2021   AST 16 09/28/2021   ALKPHOS 64 09/28/2021   BILITOT 0.3 09/28/2021   Lab Results  Component Value Date   HGBA1C 6.4 (H) 09/28/2021   HGBA1C 8.4 (H) 03/08/2021   HGBA1C 7.6 (H) 02/03/2020   HGBA1C 7.5 (H) 10/07/2019   HGBA1C 6.3 (H) 12/26/2018   Lab Results  Component Value Date   INSULIN 22.1 09/28/2021   INSULIN 26.8 (H) 06/01/2021   INSULIN 44.4 (H) 02/03/2020   INSULIN 28.1 (H) 12/26/2018   INSULIN 20.2 03/04/2018   Lab Results  Component Value Date   TSH 0.506 04/23/2021   Lab Results  Component Value Date   CHOL 127 09/28/2021   HDL 48 09/28/2021   LDLCALC 60 09/28/2021   TRIG 99 09/28/2021   CHOLHDL 4.2 04/23/2011   CHOLHDL 4.3 04/23/2011   Lab Results  Component Value Date   VD25OH 39.8 09/28/2021   VD25OH 30.0 06/01/2021   VD25OH 44.6 02/03/2020   Lab Results  Component Value Date   WBC 6.8 09/27/2021   HGB 12.7 09/27/2021   HCT 38.9 09/27/2021   MCV 90.3 09/27/2021   PLT 241 09/27/2021   No results found for: "IRON", "TIBC", "FERRITIN"  Obesity Behavioral Intervention:   Approximately 15 minutes were spent on the discussion below.  ASK: We discussed the diagnosis of obesity with [name] today and [name] agreed to give [name] permission to discuss obesity behavioral modification therapy today.  ASSESS: [name] has the diagnosis of  obesity and her BMI today is 39.5. [name] is in the action stage of change.   ADVISE: [name] was educated on the multiple health risks of obesity as well as the benefit of weight loss to improve her health. She was advised of the need for long term treatment and the importance of lifestyle modifications to improve her current health and to decrease her risk of future health problems.  AGREE: Multiple dietary modification options and treatment options were discussed and [name] agreed to follow the recommendations documented in the above note.  ARRANGE: [name] was educated on the importance of frequent visits to treat obesity as outlined per CMS and USPSTF guidelines and agreed to schedule her next follow up appointment today.  Attestation Statements:   Reviewed by clinician on day of visit: allergies, medications, problem list, medical history, surgical history, family history, social history, and previous encounter notes.  I, Elnora Morrison, RMA am acting as transcriptionist for Coralie Common, MD.  I have reviewed the above documentation for accuracy and completeness, and I agree with the  above. -  ***

## 2021-09-30 NOTE — Telephone Encounter (Signed)
Patient had office visit today with Dr. Johnsie Cancel.

## 2021-10-03 ENCOUNTER — Other Ambulatory Visit: Payer: Self-pay | Admitting: Family

## 2021-10-03 DIAGNOSIS — Z1231 Encounter for screening mammogram for malignant neoplasm of breast: Secondary | ICD-10-CM

## 2021-10-06 ENCOUNTER — Ambulatory Visit: Payer: Medicare Other

## 2021-10-07 ENCOUNTER — Ambulatory Visit
Admission: RE | Admit: 2021-10-07 | Discharge: 2021-10-07 | Disposition: A | Discharge status: Home / Self Care | Payer: Medicare Other | Source: Ambulatory Visit | Attending: Family | Admitting: Family

## 2021-10-07 ENCOUNTER — Ambulatory Visit (INDEPENDENT_AMBULATORY_CARE_PROVIDER_SITE_OTHER): Payer: Medicare Other | Admitting: Podiatry

## 2021-10-07 ENCOUNTER — Encounter: Payer: Self-pay | Admitting: Podiatry

## 2021-10-07 DIAGNOSIS — Z1231 Encounter for screening mammogram for malignant neoplasm of breast: Secondary | ICD-10-CM

## 2021-10-07 DIAGNOSIS — L600 Ingrowing nail: Secondary | ICD-10-CM | POA: Diagnosis not present

## 2021-10-07 NOTE — Patient Instructions (Signed)

## 2021-10-10 NOTE — Progress Notes (Signed)
Subjective:   Patient ID: [name], female   DOB: 61 y.o.   MRN: [id number]   HPI Patient presents stating she has had a lot of pain with the border of her left big toe and states she has tried to trim it and soak it herself without relief of symptoms and it is becoming more of a aggravation for her.  Patient does not smoke but does like to be active if possible   Review of Systems  All other systems reviewed and are negative.       Objective:  Physical Exam Vitals and nursing note reviewed.  Constitutional:      Appearance: She is well-developed.  Pulmonary:     Effort: Pulmonary effort is normal.  Musculoskeletal:        General: Normal range of motion.  Skin:    General: Skin is warm.  Neurological:     Mental Status: She is alert.     Neurovascular status intact muscle strength found to be adequate range of motion within normal limits.  Patient is found to have a incurvated medial border of the left hallux that is painful when pressed make shoe gear difficult and there is no erythema edema or drainage associated with it.  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  I explained what would be done and she is willing to accept the risk of procedure signed consent form and today I infiltrated the left hallux 60 mg like Marcaine mixture sterile prep done and using sterile instrumentation remove the border exposed matrix applied phenol 3 applications 30 seconds followed by alcohol lavage sterile dressing gave instructions on soaks and to wear dressing for 24 hours but take it off earlier if any throbbing were to occur

## 2021-10-11 ENCOUNTER — Other Ambulatory Visit: Payer: Self-pay | Admitting: Family

## 2021-10-11 DIAGNOSIS — R928 Other abnormal and inconclusive findings on diagnostic imaging of breast: Secondary | ICD-10-CM

## 2021-10-12 ENCOUNTER — Encounter (INDEPENDENT_AMBULATORY_CARE_PROVIDER_SITE_OTHER): Payer: Self-pay | Admitting: Family Medicine

## 2021-10-12 ENCOUNTER — Ambulatory Visit (INDEPENDENT_AMBULATORY_CARE_PROVIDER_SITE_OTHER): Payer: Medicare Other | Admitting: Family Medicine

## 2021-10-12 VITALS — BP 95/61 | HR 65 | Temp 97.5°F | Ht 59.0 in | Wt 199.0 lb

## 2021-10-12 DIAGNOSIS — E559 Vitamin D deficiency, unspecified: Secondary | ICD-10-CM

## 2021-10-12 DIAGNOSIS — E669 Obesity, unspecified: Secondary | ICD-10-CM

## 2021-10-12 DIAGNOSIS — F419 Anxiety disorder, unspecified: Secondary | ICD-10-CM | POA: Diagnosis not present

## 2021-10-12 DIAGNOSIS — F32A Depression, unspecified: Secondary | ICD-10-CM | POA: Diagnosis not present

## 2021-10-12 DIAGNOSIS — E1165 Type 2 diabetes mellitus with hyperglycemia: Secondary | ICD-10-CM | POA: Diagnosis not present

## 2021-10-12 DIAGNOSIS — Z6841 Body Mass Index (BMI) 40.0 and over, adult: Secondary | ICD-10-CM

## 2021-10-12 MED ORDER — SERTRALINE HCL 25 MG PO TABS: 25.0000 mg | ORAL_TABLET | Freq: Every day | ORAL | 0 refills | Status: DC

## 2021-10-12 MED ORDER — METFORMIN HCL 500 MG PO TABS: 500.0000 mg | ORAL_TABLET | Freq: Every day | ORAL | 0 refills | Status: DC

## 2021-10-12 MED ORDER — OZEMPIC (0.25 OR 0.5 MG/DOSE) 2 MG/1.5ML ~~LOC~~ SOPN: 0.5000 mg | PEN_INJECTOR | SUBCUTANEOUS | 0 refills | Status: DC

## 2021-10-12 MED ORDER — VITAMIN D (ERGOCALCIFEROL) 1.25 MG (50000 UNIT) PO CAPS: 50000.0000 [IU] | ORAL_CAPSULE | ORAL | 0 refills | Status: DC

## 2021-10-13 ENCOUNTER — Ambulatory Visit (INDEPENDENT_AMBULATORY_CARE_PROVIDER_SITE_OTHER): Payer: Medicare Other | Admitting: Family Medicine

## 2021-10-17 ENCOUNTER — Other Ambulatory Visit: Payer: Self-pay | Admitting: Family

## 2021-10-17 ENCOUNTER — Ambulatory Visit
Admission: RE | Admit: 2021-10-17 | Discharge: 2021-10-17 | Disposition: A | Discharge status: Home / Self Care | Payer: Medicare Other | Source: Ambulatory Visit | Attending: Family | Admitting: Family

## 2021-10-17 DIAGNOSIS — N6310 Unspecified lump in the right breast, unspecified quadrant: Secondary | ICD-10-CM

## 2021-10-17 DIAGNOSIS — R928 Other abnormal and inconclusive findings on diagnostic imaging of breast: Secondary | ICD-10-CM

## 2021-10-17 NOTE — Progress Notes (Signed)
Chief Complaint:   OBESITY [name] is here to discuss her progress with her obesity treatment plan along with follow-up of her obesity related diagnoses. [name] is on the Category 2 Plan and keeping a food journal and adhering to recommended goals of 400-500 calories and 35 grams of protein and states she is following her eating plan approximately 75% of the time. [name] states she is exercising 0 minutes 0 times per week.  Today's visit was #: 6 Starting weight: 209 lbs Starting date: 06/01/2021 Today's weight: 199 lbs Today's date: 10/12/2021 Total lbs lost to date: 10 lbs Total lbs lost since last in-office visit: 0  Interim History: [name] is going out of town tomorrow for her daughters graduation. She is dealing with quite a bit of significant stress and emotional lability. Family strife and distance has led her to do some emotional not eating.  Subjective:   1. Vitamin D deficiency [name] was not taking 50k weekly. She was taking 5k. She notes fatigue.  2. Type 2 diabetes mellitus with hyperglycemia, without long-term current use of insulin (HCC) [name] is currently on Ozempic and Metformin, without GI side effects. Significant improvement of A1c from 8.4 to 6.4.  3. Anxiety and depression [name] is feeling better on Zoloft. She is tearful today , as it is almost the year anniversary of her mother's death.  Assessment/Plan:   1. Vitamin D deficiency We will refill Vit D 50,000 IU once a week for 1 month with 0 refills.  -Refill Vitamin D, Ergocalciferol, (DRISDOL) 1.25 MG (50000 UNIT) CAPS capsule; Take 1 capsule (50,000 Units total) by mouth every 7 (seven) days.  Dispense: 4 capsule; Refill: 0  2. Type 2 diabetes mellitus with hyperglycemia, without long-term current use of insulin (HCC) We will refill Wegovy 0.5 mg SubQ once weekly for 1 month with 0 refills. We will refill Metformin 500 mg by mouth daily for 1 month with 0 refills.  -Refill Semaglutide,0.25 or  0.'5MG'$ /DOS, (OZEMPIC, 0.25 OR 0.5 MG/DOSE,) 2 MG/1.5ML SOPN; Inject 0.5 mg into the skin once a week.  Dispense: 3 mL; Refill: 0  -Refill metFORMIN (GLUCOPHAGE) 500 MG tablet; Take 1 tablet (500 mg total) by mouth daily with breakfast.  Dispense: 30 tablet; Refill: 0  3. Anxiety and depression We will refill Sertraline 25 mg by mouth daily for 1 month with 0 refills.  -Refill sertraline (ZOLOFT) 25 MG tablet; Take 1 tablet (25 mg total) by mouth daily.  Dispense: 90 tablet; Refill: 0  4. Obesity with current BMI of 40.3 [name] is currently in the action stage of change. As such, her goal is to continue with weight loss efforts. She has agreed to the Category 2 Plan.   [name] will have one nutritious healthy meal/a day.  Exercise goals: No exercise has been prescribed at this time.  Behavioral modification strategies: increasing lean protein intake, meal planning and cooking strategies, keeping healthy foods in the home, and dealing with family or coworker sabotage.  [name] has agreed to follow-up with our clinic in 2 weeks. She was informed of the importance of frequent follow-up visits to maximize her success with intensive lifestyle modifications for her multiple health conditions.   Objective:   Blood pressure 95/61, pulse 65, temperature (!) 97.5 F (36.4 C), height '4\' 11"'$  (1.499 m), weight 199 lb (90.3 kg), SpO2 97 %. Body mass index is 40.19 kg/m.  General: Cooperative, alert, well developed, in no acute distress. HEENT: Conjunctivae and lids unremarkable. Cardiovascular: Regular rhythm.  Lungs: Normal work of breathing. Neurologic: No focal deficits.   Lab Results  Component Value Date   CREATININE 1.08 (H) 09/28/2021   BUN 15 09/28/2021   NA 144 09/28/2021   K 4.0 09/28/2021   CL 104 09/28/2021   CO2 26 09/28/2021   Lab Results  Component Value Date   ALT 20 09/28/2021   AST 16 09/28/2021   ALKPHOS 64 09/28/2021   BILITOT 0.3 09/28/2021   Lab Results   Component Value Date   HGBA1C 6.4 (H) 09/28/2021   HGBA1C 8.4 (H) 03/08/2021   HGBA1C 7.6 (H) 02/03/2020   HGBA1C 7.5 (H) 10/07/2019   HGBA1C 6.3 (H) 12/26/2018   Lab Results  Component Value Date   INSULIN 22.1 09/28/2021   INSULIN 26.8 (H) 06/01/2021   INSULIN 44.4 (H) 02/03/2020   INSULIN 28.1 (H) 12/26/2018   INSULIN 20.2 03/04/2018   Lab Results  Component Value Date   TSH 0.506 04/23/2021   Lab Results  Component Value Date   CHOL 127 09/28/2021   HDL 48 09/28/2021   LDLCALC 60 09/28/2021   TRIG 99 09/28/2021   CHOLHDL 4.2 04/23/2011   CHOLHDL 4.3 04/23/2011   Lab Results  Component Value Date   VD25OH 39.8 09/28/2021   VD25OH 30.0 06/01/2021   VD25OH 44.6 02/03/2020   Lab Results  Component Value Date   WBC 6.8 09/27/2021   HGB 12.7 09/27/2021   HCT 38.9 09/27/2021   MCV 90.3 09/27/2021   PLT 241 09/27/2021   No results found for: "IRON", "TIBC", "FERRITIN"  Obesity Behavioral Intervention:   Approximately 15 minutes were spent on the discussion below.  ASK: We discussed the diagnosis of obesity with [name] today and [name] agreed to give [name] permission to discuss obesity behavioral modification therapy today.  ASSESS: [name] has the diagnosis of obesity and her BMI today is 40.3. [name] is in the action stage of change.   ADVISE: [name] was educated on the multiple health risks of obesity as well as the benefit of weight loss to improve her health. She was advised of the need for long term treatment and the importance of lifestyle modifications to improve her current health and to decrease her risk of future health problems.  AGREE: Multiple dietary modification options and treatment options were discussed and [name] agreed to follow the recommendations documented in the above note.  ARRANGE: [name] was educated on the importance of frequent visits to treat obesity as outlined per CMS and USPSTF guidelines and agreed to schedule her next follow up  appointment today.  Attestation Statements:   Reviewed by clinician on day of visit: allergies, medications, problem list, medical history, surgical history, family history, social history, and previous encounter notes.  I, Elnora Morrison, RMA am acting as transcriptionist for Coralie Common, MD. I have reviewed the above documentation for accuracy and completeness, and I agree with the above. - Coralie Common, MD

## 2021-10-27 ENCOUNTER — Other Ambulatory Visit (INDEPENDENT_AMBULATORY_CARE_PROVIDER_SITE_OTHER): Payer: Self-pay | Admitting: Family Medicine

## 2021-10-27 ENCOUNTER — Ambulatory Visit
Admission: RE | Admit: 2021-10-27 | Discharge: 2021-10-27 | Disposition: A | Discharge status: Home / Self Care | Payer: Medicare Other | Source: Ambulatory Visit | Attending: Family | Admitting: Family

## 2021-10-27 DIAGNOSIS — E1165 Type 2 diabetes mellitus with hyperglycemia: Secondary | ICD-10-CM

## 2021-10-27 DIAGNOSIS — N6310 Unspecified lump in the right breast, unspecified quadrant: Secondary | ICD-10-CM

## 2021-10-28 ENCOUNTER — Telehealth: Payer: Self-pay | Admitting: Hematology and Oncology

## 2021-10-28 NOTE — Telephone Encounter (Signed)
Left message for patient to return call in reference to upcoming morning clinic appointment on 7/12, will email and mail packet

## 2021-10-31 ENCOUNTER — Encounter: Payer: Self-pay | Admitting: *Deleted

## 2021-10-31 ENCOUNTER — Encounter (INDEPENDENT_AMBULATORY_CARE_PROVIDER_SITE_OTHER): Payer: Self-pay | Admitting: Family Medicine

## 2021-10-31 ENCOUNTER — Ambulatory Visit (INDEPENDENT_AMBULATORY_CARE_PROVIDER_SITE_OTHER): Payer: Medicare Other | Admitting: Family Medicine

## 2021-10-31 ENCOUNTER — Ambulatory Visit: Payer: Self-pay | Admitting: Surgery

## 2021-10-31 VITALS — BP 103/68 | HR 85 | Temp 98.3°F | Ht 59.0 in | Wt 194.0 lb

## 2021-10-31 DIAGNOSIS — F419 Anxiety disorder, unspecified: Secondary | ICD-10-CM

## 2021-10-31 DIAGNOSIS — Z7984 Long term (current) use of oral hypoglycemic drugs: Secondary | ICD-10-CM

## 2021-10-31 DIAGNOSIS — E669 Obesity, unspecified: Secondary | ICD-10-CM

## 2021-10-31 DIAGNOSIS — F32A Depression, unspecified: Secondary | ICD-10-CM

## 2021-10-31 DIAGNOSIS — E559 Vitamin D deficiency, unspecified: Secondary | ICD-10-CM

## 2021-10-31 DIAGNOSIS — Z7985 Long-term (current) use of injectable non-insulin antidiabetic drugs: Secondary | ICD-10-CM

## 2021-10-31 DIAGNOSIS — Z6839 Body mass index (BMI) 39.0-39.9, adult: Secondary | ICD-10-CM

## 2021-10-31 DIAGNOSIS — E1165 Type 2 diabetes mellitus with hyperglycemia: Secondary | ICD-10-CM | POA: Diagnosis not present

## 2021-10-31 DIAGNOSIS — C50211 Malignant neoplasm of upper-inner quadrant of right female breast: Secondary | ICD-10-CM | POA: Insufficient documentation

## 2021-10-31 DIAGNOSIS — Z171 Estrogen receptor negative status [ER-]: Secondary | ICD-10-CM | POA: Insufficient documentation

## 2021-10-31 MED ORDER — SERTRALINE HCL 25 MG PO TABS: 25.0000 mg | ORAL_TABLET | Freq: Every day | ORAL | 0 refills | Status: DC

## 2021-10-31 MED ORDER — OZEMPIC (0.25 OR 0.5 MG/DOSE) 2 MG/1.5ML ~~LOC~~ SOPN: 0.5000 mg | PEN_INJECTOR | SUBCUTANEOUS | 0 refills | Status: DC

## 2021-10-31 MED ORDER — METFORMIN HCL 500 MG PO TABS: 500.0000 mg | ORAL_TABLET | Freq: Every day | ORAL | 0 refills | Status: DC

## 2021-10-31 MED ORDER — VITAMIN D (ERGOCALCIFEROL) 1.25 MG (50000 UNIT) PO CAPS: 50000.0000 [IU] | ORAL_CAPSULE | ORAL | 0 refills | Status: DC

## 2021-10-31 NOTE — Progress Notes (Signed)
Radiation Oncology         [phone] ________________________________  Name: [name]        MRN: [id number]  Date of Service: 11/02/2021 DOB: [birth year]  LE:XNTZG, Tara Limes., FNP  Donnie Mesa, MD     REFERRING PHYSICIAN: Donnie Mesa, MD   DIAGNOSIS: The encounter diagnosis was Malignant neoplasm of upper-inner quadrant of right female breast, unspecified estrogen receptor status ([place]).   HISTORY OF PRESENT ILLNESS: [name] is a 61 y.o. female seen in the multidisciplinary breast clinic for a new diagnosis of right breast cancer. The patient was noted to have right breast mass seen on screening mammogram.  Diagnostic work-up including an ultrasound on 10/17/2021 showed a mass in the 1 o'clock position of the right breast measuring up to 8 mm.  Her axilla was negative for adenopathy.  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  Prognostic markers showed triple negative findings.  She is seen today to discuss treatment recommendations of her cancer.[name]    PREVIOUS RADIATION THERAPY: No   PAST MEDICAL HISTORY:  Past Medical History:  Diagnosis Date   Allergy    Anginal pain ([place])    a. NL cath in 2008;  b. Myoview 03/2011: dec uptake along mid anterior wall on stress imaging -> ? attenuation vs. ischemia, EF 65%;  c. 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 benign thymoma   Neuromuscular disorder ([place])     Obesity    Palpitations    Pneumonia 05/2016   double   Pulmonary edema    Pulmonary embolism ([place])    a. 2008 -> coumadin x 6 mos.   Rheumatoid arthritis ([place])    Sleep apnea    on CPAP 02/2018   SOB (shortness of breath)    Thyroid disease    TIA (transient ischemic attack)    Urinary urgency    Vitamin D deficiency        PAST SURGICAL HISTORY: Past Surgical History:  Procedure Laterality Date   ABDOMINAL HYSTERECTOMY  2005   APPENDECTOMY     CARDIAC CATHETERIZATION     Normal   CARDIAC CATHETERIZATION N/A 09/30/2014   Procedure: Left Heart Cath and Coronary Angiography;  Surgeon: Sherren Mocha, MD;  Location: [facility];  Service: Cardiovascular;  Laterality: N/A;   LAPAROSCOPIC APPENDECTOMY N/A 06/03/2012   Procedure: APPENDECTOMY LAPAROSCOPIC;  Surgeon: Stark Klein, MD;  Location: [facility];  Service: General;  Laterality: N/A;   Left knee surgery  2008   LEG SURGERY     TONSILLECTOMY     TOTAL HIP ARTHROPLASTY Left 06/11/2018   Procedure: LEFT TOTAL HIP ARTHROPLASTY  ANTERIOR APPROACH;  Surgeon: Meredith Pel, MD;  Location: [facility];  Service: Orthopedics;  Laterality: Left;   TOTAL KNEE ARTHROPLASTY Left 08/22/2016   Procedure: TOTAL KNEE ARTHROPLASTY;  Surgeon: Meredith Pel, MD;  Location: [facility];  Service: Orthopedics;  Laterality: Left;   TUBAL LIGATION  1989     FAMILY HISTORY:  Family History  Problem Relation Age of Onset   Emphysema Mother    Arthritis Mother    Heart failure Mother        alive @ 6   Stroke Mother    Diabetes Mother    Hypertension Mother    Hyperlipidemia Mother    Depression Mother    Anxiety disorder Mother    Heart disease Mother    Alcoholism Mother    Depression Father    Heart disease Father        died @ 58's.   Stroke Father    Diabetes Father    Hyperlipidemia Father    Hypertension Father    Bipolar disorder Father    Sleep apnea Father    Alcoholism Father    Drug abuse Father    Sudden death  Father    Diabetes Sister    Asthma Brother    Colon cancer Maternal Grandfather    Heart disease Paternal Grandfather    Breast cancer Maternal Aunt    Esophageal cancer Neg Hx    Stomach cancer Neg Hx    Rectal cancer Neg Hx      SOCIAL HISTORY:  reports that she quit smoking about 36 years ago. Her smoking use included cigarettes. She has a 7.50 pack-year smoking history. She has never used smokeless tobacco. She reports that she does not currently use drugs after having used the following drugs: Marijuana. She reports that she does not drink alcohol.  The patient is divorced and lives in [place].  She is unemployed but is very active in her church and community with providing care and resources to people experiencing homelessness. She is accompanied by her daughter.    ALLERGIES: Penicillins, Hydrocodone-acetaminophen, Latex, and Sulfa antibiotics   MEDICATIONS:  Current Outpatient Medications  Medication Sig Dispense Refill   albuterol (PROVENTIL HFA;VENTOLIN HFA) 108 (90 Base) MCG/ACT inhaler Inhale 2 puffs into the lungs every 6 (six) hours as needed for wheezing or shortness of breath.     aspirin 81 MG chewable tablet Chew 81 mg by mouth daily.     atorvastatin (LIPITOR) 40 MG tablet Take 40 mg by mouth at bedtime.      busPIRone (BUSPAR) 30 MG tablet Take 30 mg by mouth 2 (two) times daily.     carvedilol (COREG) 6.25 MG tablet Take 1 tablet (6.25 mg total) by mouth 2 (two) times daily. 180 tablet 3   diclofenac Sodium (VOLTAREN) 1 % GEL Apply 2 g topically 4 (four) times daily as needed (joint/muscle pain).     furosemide (LASIX) 40 MG tablet Take 60 mg by mouth daily.     hydrOXYzine (VISTARIL) 25 MG capsule Take 25 mg by mouth 3 (three) times daily as needed for itching.     isosorbide dinitrate (ISORDIL) 20 MG tablet Take 20 mg by mouth daily.     lidocaine (LIDODERM) 5 % Place 1 patch onto the skin daily as needed for pain.     lisinopril (PRINIVIL,ZESTRIL) 20 MG  tablet Take 20 mg by mouth daily.      loperamide (IMODIUM A-D) 2 MG tablet Take 4 mg by  mouth 4 (four) times daily as needed for diarrhea or loose stools.      meloxicam (MOBIC) 15 MG tablet meloxicam 15 mg tablet  TAKE 1 TABLET (15 MG) BY ORAL ROUTE ONCE DAILY FOR 90 DAYS     metFORMIN (GLUCOPHAGE) 500 MG tablet Take 1 tablet (500 mg total) by mouth daily with breakfast. 30 tablet 0   MYRBETRIQ 25 MG TB24 tablet Take 1 tablet (25 mg total) by mouth daily. 30 tablet 0   nystatin (MYCOSTATIN/NYSTOP) powder Apply 1 application topically 3 (three) times daily as needed for rash.     omeprazole (PRILOSEC) 20 MG capsule Take 1 capsule (20 mg total) by mouth daily. 90 capsule 0   pantoprazole (PROTONIX) 40 MG tablet Take 1 tablet (40 mg total) by mouth 2 (two) times daily. 60 tablet 0   potassium chloride (KLOR-CON M10) 10 MEQ tablet Take 1 tablet (10 mEq total) by mouth 2 (two) times daily. 60 tablet 1   Semaglutide,0.25 or 0.5MG /DOS, (OZEMPIC, 0.25 OR 0.5 MG/DOSE,) 2 MG/1.5ML SOPN Inject 0.5 mg into the skin once a week. 3 mL 0   sertraline (ZOLOFT) 25 MG tablet Take 1 tablet (25 mg total) by mouth daily. 90 tablet 0   Vitamin D, Ergocalciferol, (DRISDOL) 1.25 MG (50000 UNIT) CAPS capsule Take 1 capsule (50,000 Units total) by mouth every 7 (seven) days. 4 capsule 0   No current facility-administered medications for this visit.     REVIEW OF SYSTEMS: On review of systems, the patient reports that she is doing pretty well overall navigating her diagnosis. She is reassured with the information she's received today. She's been working with healthy weight and wellness clinic and needing to try to increase her protein intake, but has a poor appetite. She reports chronic back, left leg, and right elbow pain. She has some heartburn and abdominal discomfort at times as well as urinary incontinence. No other complaints are verbalized.      PHYSICAL EXAM:  Wt Readings from Last 3 Encounters:  10/12/21 199  lb (90.3 kg)  09/30/21 195 lb (88.5 kg)  09/28/21 195 lb (88.5 kg)   Temp Readings from Last 3 Encounters:  10/12/21 (!) 97.5 F (36.4 C)  09/28/21 97.8 F (36.6 C)  09/27/21 97.8 F (36.6 C) (Oral)   BP Readings from Last 3 Encounters:  10/12/21 95/61  09/30/21 110/72  09/28/21 99/65   Pulse Readings from Last 3 Encounters:  10/12/21 65  09/30/21 76  09/28/21 75    In general this is a well appearing African-American female in no acute distress. She's alert and oriented x4 and appropriate throughout the examination. Cardiopulmonary assessment is negative for acute distress and she exhibits normal effort. Bilateral breast exam is deferred.    ECOG = 1  0 - Asymptomatic (Fully active, able to carry on all predisease activities without restriction)  1 - Symptomatic but completely ambulatory (Restricted in physically strenuous activity but ambulatory and able to carry out work of a light or sedentary nature. For example, light housework, office work)  2 - Symptomatic, <50% in bed during the day (Ambulatory and capable of all self care but unable to carry out any work activities. Up and about more than 50% of waking hours)  3 - Symptomatic, >50% in bed, but not bedbound (Capable of only limited self-care, confined to bed or chair 50% or more of waking hours)  4 - Bedbound (Completely disabled. Cannot carry on any self-care. Totally confined to bed or chair)  5 - Death   Eustace Pen MM, Creech RH, Tormey DC, et al. [phone]). "Toxicity and response criteria of the [facility]". [place] Oncol. 5 (6): 649-55    LABORATORY DATA:  Lab Results  Component Value Date   WBC 6.8 09/27/2021   HGB 12.7 09/27/2021   HCT 38.9 09/27/2021   MCV 90.3 09/27/2021   PLT 241 09/27/2021   Lab Results  Component Value Date   NA 144 09/28/2021   K 4.0 09/28/2021   CL 104 09/28/2021   CO2 26 09/28/2021   Lab Results  Component Value Date   ALT 20 09/28/2021   AST  16 09/28/2021   ALKPHOS 64 09/28/2021   BILITOT 0.3 09/28/2021      RADIOGRAPHY: MM CLIP PLACEMENT RIGHT  Result Date: 10/27/2021 CLINICAL DATA:  Status post right breast ultrasound-guided biopsy. EXAM: 3D DIAGNOSTIC RIGHT MAMMOGRAM POST ULTRASOUND BIOPSY COMPARISON:  Previous exam(s). FINDINGS: 3D Mammographic images were obtained following ultrasound guided biopsy of the right breast. The biopsy marking clip is in expected position at the site of biopsy. IMPRESSION: Appropriate positioning of the coil shaped biopsy marking clip at the site of biopsy in the upper inner right breast. Final Assessment: Post Procedure Mammograms for Marker Placement Electronically Signed   By: Kristopher Oppenheim M.D.   On: 10/27/2021 13:49  [name] RT BREAST BX W LOC DEV 1ST LESION IMG BX SPEC US GUIDE  Result Date: 10/27/2021 CLINICAL DATA:  61 year old female with a suspicious right breast mass. EXAM: ULTRASOUND GUIDED RIGHT BREAST CORE NEEDLE BIOPSY COMPARISON:  Prior studies. PROCEDURE: I met with the patient and we discussed the procedure of ultrasound-guided biopsy, including benefits and alternatives. We discussed the high likelihood of a successful procedure. We discussed the risks of the procedure, including infection, bleeding, tissue injury, clip migration, and inadequate sampling. Informed written consent was given. The usual time-out protocol was performed immediately prior to the procedure. Lesion quadrant: Upper inner quadrant Using sterile technique and 1% Lidocaine as local anesthetic, under direct ultrasound visualization, a 14 gauge spring-loaded device was used to perform biopsy of a suspicious mass at the 1 o'clock position using a lateral approach. At the conclusion of the procedure a coil shaped tissue marker clip was deployed into the biopsy cavity. Follow up 2 view mammogram was performed and dictated separately. IMPRESSION: Ultrasound guided biopsy of the right breast. No apparent complications.  Electronically Signed   By: Kristopher Oppenheim M.D.   On: 10/27/2021 13:34  MM DIAG BREAST TOMO UNI RIGHT  Result Date: 10/17/2021 CLINICAL DATA:  Callback from screening mammogram for possible mass right breast EXAM: DIGITAL DIAGNOSTIC UNILATERAL RIGHT MAMMOGRAM WITH TOMOSYNTHESIS AND CAD; ULTRASOUND RIGHT BREAST LIMITED TECHNIQUE: Right digital diagnostic mammography and breast tomosynthesis was performed. The images were evaluated with computer-aided detection.; Targeted ultrasound examination of the right breast was performed COMPARISON:  Previous exam(s). ACR Breast Density Category b: There are scattered areas of fibroglandular density. FINDINGS: Spot compression cc and MLO views of the right breast are submitted. The previously questioned mass in the medial upper right breast is persistent. Targeted ultrasound is performed, showing a 8 x 6 x 7 mm irregular mass at the right breast 1 o'clock 13 cm from nipple correlating to the mammographic mass. Ultrasound of the right axilla is negative. IMPRESSION: Highly suspicious findings. RECOMMENDATION: Ultrasound-guided core biopsy of right breast mass. I have discussed the findings and recommendations with the patient. If applicable, a reminder letter will be sent to the patient  regarding the next appointment. BI-RADS CATEGORY  5: Highly suggestive of malignancy. Electronically Signed   By: Abelardo Diesel M.D.   On: 10/17/2021 12:43  US BREAST LTD UNI RIGHT INC AXILLA  Result Date: 10/17/2021 CLINICAL DATA:  Callback from screening mammogram for possible mass right breast EXAM: DIGITAL DIAGNOSTIC UNILATERAL RIGHT MAMMOGRAM WITH TOMOSYNTHESIS AND CAD; ULTRASOUND RIGHT BREAST LIMITED TECHNIQUE: Right digital diagnostic mammography and breast tomosynthesis was performed. The images were evaluated with computer-aided detection.; Targeted ultrasound examination of the right breast was performed COMPARISON:  Previous exam(s). ACR Breast Density Category b: There are  scattered areas of fibroglandular density. FINDINGS: Spot compression cc and MLO views of the right breast are submitted. The previously questioned mass in the medial upper right breast is persistent. Targeted ultrasound is performed, showing a 8 x 6 x 7 mm irregular mass at the right breast 1 o'clock 13 cm from nipple correlating to the mammographic mass. Ultrasound of the right axilla is negative. IMPRESSION: Highly suspicious findings. RECOMMENDATION: Ultrasound-guided core biopsy of right breast mass. I have discussed the findings and recommendations with the patient. If applicable, a reminder letter will be sent to the patient regarding the next appointment. BI-RADS CATEGORY  5: Highly suggestive of malignancy. Electronically Signed   By: Abelardo Diesel M.D.   On: 10/17/2021 12:43  MM 3D SCREEN BREAST BILATERAL  Result Date: 10/10/2021 CLINICAL DATA:  Screening. EXAM: DIGITAL SCREENING BILATERAL MAMMOGRAM WITH TOMOSYNTHESIS AND CAD TECHNIQUE: Bilateral screening digital craniocaudal and mediolateral oblique mammograms were obtained. Bilateral screening digital breast tomosynthesis was performed. The images were evaluated with computer-aided detection. COMPARISON:  Previous exam(s). ACR Breast Density Category b: There are scattered areas of fibroglandular density. FINDINGS: In the right breast, a possible mass warrants further evaluation. In the left breast, no findings suspicious for malignancy. IMPRESSION: Further evaluation is suggested for a possible mass in the right breast. RECOMMENDATION: Diagnostic mammogram and possibly ultrasound of the right breast. (Code:[id number]) The patient will be contacted regarding the findings, and additional imaging will be scheduled. BI-RADS CATEGORY  0: Incomplete. Need additional imaging evaluation and/or prior mammograms for comparison. Electronically Signed   By: Dorise Bullion III M.D.   On: 10/10/2021 15:56       IMPRESSION/PLAN: 1. Stage IA, [id number], grade 3,  triple negative invasive ductal carcinoma of the right breast. Dr. Lisbeth Renshaw discusses the pathology findings and reviews the nature of early stage breast disease. The consensus from the breast conference includes breast conservation with lumpectomy with  sentinel node biopsy. Dr. Chryl Heck will review final pathology and if her tumor is >5 mm would recommend systemic chemotherapy. Dr. Lisbeth Renshaw recommends external radiotherapy to the breast  to reduce risks of local recurrence. We discussed the risks, benefits, short, and long term effects of radiotherapy, as well as the curative intent, and the patient is interested in proceeding. Dr. Lisbeth Renshaw discusses the delivery and logistics of radiotherapy and anticipates a course of 4 weeks of radiotherapy. We will see her back a few weeks after surgery to discuss the simulation process and anticipate we starting radiotherapy about 4-6 weeks after surgery.  2. Possible genetic predisposition to malignancy. The patient is a candidate for genetic testing given her  personal and family history. She was offered referral and will meet genetics today.   In a visit lasting 60 minutes, greater than 50% of the time was spent face to face reviewing her case, as well as in preparation of, discussing, and coordinating the patient's care.  The above  documentation reflects my direct findings during this shared patient visit. Please see the separate note by Dr. Lisbeth Renshaw on this date for the remainder of the patient's plan of care.    Carola Rhine, [facility]    **Disclaimer: This note was dictated with voice recognition software. Similar sounding words can inadvertently be transcribed and this note may contain transcription errors which may not have been corrected upon publication of note.**

## 2021-11-01 ENCOUNTER — Encounter: Payer: Self-pay | Admitting: *Deleted

## 2021-11-02 ENCOUNTER — Encounter: Payer: Self-pay | Admitting: *Deleted

## 2021-11-02 ENCOUNTER — Inpatient Hospital Stay (HOSPITAL_BASED_OUTPATIENT_CLINIC_OR_DEPARTMENT_OTHER): Payer: Medicare Other | Admitting: Hematology and Oncology

## 2021-11-02 ENCOUNTER — Ambulatory Visit: Payer: Self-pay | Admitting: Surgery

## 2021-11-02 ENCOUNTER — Telehealth: Payer: Self-pay | Admitting: *Deleted

## 2021-11-02 ENCOUNTER — Encounter: Payer: Self-pay | Admitting: Radiology

## 2021-11-02 ENCOUNTER — Encounter: Payer: Self-pay | Admitting: Hematology and Oncology

## 2021-11-02 ENCOUNTER — Inpatient Hospital Stay (HOSPITAL_BASED_OUTPATIENT_CLINIC_OR_DEPARTMENT_OTHER): Payer: Medicare Other | Admitting: Genetic Counselor

## 2021-11-02 ENCOUNTER — Other Ambulatory Visit: Payer: Self-pay

## 2021-11-02 ENCOUNTER — Inpatient Hospital Stay: Payer: Medicare Other | Attending: Hematology and Oncology

## 2021-11-02 ENCOUNTER — Ambulatory Visit
Admission: RE | Admit: 2021-11-02 | Discharge: 2021-11-02 | Disposition: A | Discharge status: Home / Self Care | Payer: Medicare Other | Source: Ambulatory Visit | Attending: Radiation Oncology | Admitting: Radiation Oncology

## 2021-11-02 ENCOUNTER — Inpatient Hospital Stay: Payer: Medicare Other | Admitting: Licensed Clinical Social Worker

## 2021-11-02 DIAGNOSIS — Z8349 Family history of other endocrine, nutritional and metabolic diseases: Secondary | ICD-10-CM | POA: Diagnosis not present

## 2021-11-02 DIAGNOSIS — C50211 Malignant neoplasm of upper-inner quadrant of right female breast: Secondary | ICD-10-CM

## 2021-11-02 DIAGNOSIS — E079 Disorder of thyroid, unspecified: Secondary | ICD-10-CM | POA: Insufficient documentation

## 2021-11-02 DIAGNOSIS — Z882 Allergy status to sulfonamides status: Secondary | ICD-10-CM | POA: Diagnosis not present

## 2021-11-02 DIAGNOSIS — Z8249 Family history of ischemic heart disease and other diseases of the circulatory system: Secondary | ICD-10-CM | POA: Insufficient documentation

## 2021-11-02 DIAGNOSIS — Z814 Family history of other substance abuse and dependence: Secondary | ICD-10-CM | POA: Insufficient documentation

## 2021-11-02 DIAGNOSIS — Z833 Family history of diabetes mellitus: Secondary | ICD-10-CM | POA: Diagnosis not present

## 2021-11-02 DIAGNOSIS — Z803 Family history of malignant neoplasm of breast: Secondary | ICD-10-CM | POA: Insufficient documentation

## 2021-11-02 DIAGNOSIS — Z800 Family history of malignant neoplasm of digestive organs: Secondary | ICD-10-CM | POA: Diagnosis not present

## 2021-11-02 DIAGNOSIS — Z79899 Other long term (current) drug therapy: Secondary | ICD-10-CM | POA: Insufficient documentation

## 2021-11-02 DIAGNOSIS — G4733 Obstructive sleep apnea (adult) (pediatric): Secondary | ICD-10-CM | POA: Diagnosis not present

## 2021-11-02 DIAGNOSIS — N6312 Unspecified lump in the right breast, upper inner quadrant: Secondary | ICD-10-CM | POA: Insufficient documentation

## 2021-11-02 DIAGNOSIS — I509 Heart failure, unspecified: Secondary | ICD-10-CM | POA: Insufficient documentation

## 2021-11-02 DIAGNOSIS — Z836 Family history of other diseases of the respiratory system: Secondary | ICD-10-CM | POA: Insufficient documentation

## 2021-11-02 DIAGNOSIS — Z8673 Personal history of transient ischemic attack (TIA), and cerebral infarction without residual deficits: Secondary | ICD-10-CM | POA: Insufficient documentation

## 2021-11-02 DIAGNOSIS — C50411 Malignant neoplasm of upper-outer quadrant of right female breast: Secondary | ICD-10-CM

## 2021-11-02 DIAGNOSIS — Z885 Allergy status to narcotic agent status: Secondary | ICD-10-CM

## 2021-11-02 DIAGNOSIS — I110 Hypertensive heart disease with heart failure: Secondary | ICD-10-CM

## 2021-11-02 DIAGNOSIS — Z880 Allergy status to penicillin: Secondary | ICD-10-CM

## 2021-11-02 DIAGNOSIS — Z8261 Family history of arthritis: Secondary | ICD-10-CM

## 2021-11-02 DIAGNOSIS — E785 Hyperlipidemia, unspecified: Secondary | ICD-10-CM

## 2021-11-02 DIAGNOSIS — Z87891 Personal history of nicotine dependence: Secondary | ICD-10-CM | POA: Diagnosis not present

## 2021-11-02 DIAGNOSIS — E119 Type 2 diabetes mellitus without complications: Secondary | ICD-10-CM

## 2021-11-02 DIAGNOSIS — Z171 Estrogen receptor negative status [ER-]: Secondary | ICD-10-CM | POA: Insufficient documentation

## 2021-11-02 DIAGNOSIS — I10 Essential (primary) hypertension: Secondary | ICD-10-CM | POA: Diagnosis not present

## 2021-11-02 DIAGNOSIS — Z811 Family history of alcohol abuse and dependence: Secondary | ICD-10-CM

## 2021-11-02 DIAGNOSIS — Z9049 Acquired absence of other specified parts of digestive tract: Secondary | ICD-10-CM

## 2021-11-02 DIAGNOSIS — C50419 Malignant neoplasm of upper-outer quadrant of unspecified female breast: Secondary | ICD-10-CM | POA: Insufficient documentation

## 2021-11-02 DIAGNOSIS — Z86711 Personal history of pulmonary embolism: Secondary | ICD-10-CM | POA: Insufficient documentation

## 2021-11-02 DIAGNOSIS — Z818 Family history of other mental and behavioral disorders: Secondary | ICD-10-CM | POA: Insufficient documentation

## 2021-11-02 DIAGNOSIS — I2510 Atherosclerotic heart disease of native coronary artery without angina pectoris: Secondary | ICD-10-CM

## 2021-11-02 LAB — CBC WITH DIFFERENTIAL (CANCER CENTER ONLY)
Abs Immature Granulocytes: 0.02 10*3/uL (ref 0.00–0.07)
Basophils Absolute: 0.1 10*3/uL (ref 0.0–0.1)
Basophils Relative: 1 %
Eosinophils Absolute: 0.2 10*3/uL (ref 0.0–0.5)
Eosinophils Relative: 3 %
HCT: 39.6 % (ref 36.0–46.0)
Hemoglobin: 13 g/dL (ref 12.0–15.0)
Immature Granulocytes: 0 %
Lymphocytes Relative: 43 %
Lymphs Abs: 2.3 10*3/uL (ref 0.7–4.0)
MCH: 28.8 pg (ref 26.0–34.0)
MCHC: 32.8 g/dL (ref 30.0–36.0)
MCV: 87.8 fL (ref 80.0–100.0)
Monocytes Absolute: 0.5 10*3/uL (ref 0.1–1.0)
Monocytes Relative: 9 %
Neutro Abs: 2.3 10*3/uL (ref 1.7–7.7)
Neutrophils Relative %: 44 %
Platelet Count: 222 10*3/uL (ref 150–400)
RBC: 4.51 MIL/uL (ref 3.87–5.11)
RDW: 13.1 % (ref 11.5–15.5)
WBC Count: 5.3 10*3/uL (ref 4.0–10.5)
nRBC: 0 % (ref 0.0–0.2)

## 2021-11-02 LAB — GENETIC SCREENING ORDER

## 2021-11-02 LAB — CMP (CANCER CENTER ONLY)
ALT: 18 U/L (ref 0–44)
AST: 13 U/L — ABNORMAL LOW (ref 15–41)
Albumin: 4.1 g/dL (ref 3.5–5.0)
Alkaline Phosphatase: 52 U/L (ref 38–126)
Anion gap: 5 (ref 5–15)
BUN: 16 mg/dL (ref 8–23)
CO2: 30 mmol/L (ref 22–32)
Calcium: 9.6 mg/dL (ref 8.9–10.3)
Chloride: 107 mmol/L (ref 98–111)
Creatinine: 1.07 mg/dL — ABNORMAL HIGH (ref 0.44–1.00)
GFR, Estimated: 59 mL/min — ABNORMAL LOW (ref >60)
Glucose, Bld: 150 mg/dL — ABNORMAL HIGH (ref 70–99)
Potassium: 3.8 mmol/L (ref 3.5–5.1)
Sodium: 142 mmol/L (ref 135–145)
Total Bilirubin: 0.3 mg/dL (ref 0.3–1.2)
Total Protein: 7 g/dL (ref 6.5–8.1)

## 2021-11-02 NOTE — Telephone Encounter (Signed)
   Pre-operative Risk Assessment    Patient Name: [name]  DOB: May 14, 1960 MRN: [id number]      Request for Surgical Clearance    Procedure:   Right Breast Lumpectomy  Date of Surgery:  Clearance TBD                                 Surgeon:  Dr. Donnie Mesa Surgeon's Group or Practice Name:  [facility] Phone number:  [phone] Fax number:  [phone]   Type of Clearance Requested:   - Pharmacy:  Hold Aspirin     Type of Anesthesia:  General    Additional requests/questions:    Signed, Greer Ee   11/02/2021, 4:27 PM

## 2021-11-02 NOTE — Assessment & Plan Note (Addendum)
This is a very pleasant 61 year old female patient with past medical history significant for congestive heart failure, coronary artery disease, diabetes, hypertension, dyslipidemia history of pulmonary embolism back in 2008 for which she needed anticoagulation, rheumatoid arthritis, obstructive sleep apnea who is newly diagnosed with right breast IDC noticed on screening mammogram.  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  Given small tumor size and despite triple negative biology, I agree it is reasonable to proceed with upfront surgery followed by consideration of adjuvant chemotherapy if final pathology also confirms the diagnosis.  She has multiple comorbidities at baseline hence adjuvant TC x4 cycles could be a reasonable chemotherapy option versus CMF every 21 days for 8 cycles.  We have discussed about mechanism of action of chemotherapy, adverse effects of chemotherapy including but not limited to fatigue, nausea, vomiting, increased risk of infections, neuropathy.   She understands that some of the side effects can be permanent and life-threatening.  We have also discussed about risk of secondary malignancies with chemotherapy. She has no underlying peripheral neuropathy from DM.  No role for antiestrogen therapy, triple negative biology.  She will return to clinic after surgery to review final pathology and to discuss additional recommendations.  Thank you for consulting [name] in the care of this patient.  Please not hesitate to contact us with any additional questions or concerns

## 2021-11-02 NOTE — Research (Signed)
[employer] 2021-05 - Specimen Collection Study to Evaluate Biomarkers in Subjects with Cancer    11/02/2021  INTRO:  Patient [name] was identified by Dr. Chryl Heck as a potential candidate for the above listed study.  This Clinical Research Coordinator met with [name], [id number], on 11/02/21 in a manner and location that ensures patient privacy to discuss participation in the above listed research study.  Patient is Accompanied by her daughter .  A copy of the informed consent document with embedded HIPAA language was provided to the patient.  Patient reads, speaks, and understands [place].   Patient was provided with the business card of this Coordinator and encouraged to contact the research team with any questions.  Approximately 10 minutes were spent with the patient reviewing the informed consent documents.  Patient was provided the option of taking informed consent documents home to review and was encouraged to review at their convenience with their support network, including other care providers. Patient took the consent documents home to review. Will follow up with patient on Monday to confirm any questions/concerns or potential interest. Patient thanked for her time.   Carol Ada, RT(R)(T) Clinical Research Coordinator

## 2021-11-02 NOTE — Progress Notes (Signed)
[facility] NOTE  Patient Care Team: Sonia Side., FNP as PCP - General (Family Medicine) Josue Hector, MD as PCP - Cardiology (Cardiology) Mauro Kaufmann, RN as Oncology Nurse Navigator Rockwell Germany, RN as Oncology Nurse Navigator Donnie Mesa, MD as Consulting Physician (General Surgery) [name] Pike, MD as Consulting Physician (Hematology and Oncology) Kyung Rudd, MD as Consulting Physician (Radiation Oncology)  CHIEF COMPLAINTS/PURPOSE OF CONSULTATION:  Newly diagnosed breast cancer  HISTORY OF PRESENTING ILLNESS:  [name] 61 y.o. female is here because of recent diagnosis of right breast IDC  I reviewed her records extensively and collaborated the history with the patient.  SUMMARY OF ONCOLOGIC HISTORY: Oncology History  Malignant neoplasm of upper-outer quadrant of female breast ([place])  10/07/2021 Mammogram   Right breast, possible mass warrants further evaluation, no findings suspicious for malignancy in the left breast.Targeted ultrasound is performed, showing a 8 x 6 x 7 mm irregular mass at the right breast 1 o'clock 13 cm from nipple correlating to the mammographic mass. Ultrasound of the right axilla is negative.   10/27/2021 Pathology Results   Per verbal report at breast [place] today, grade 3 IDC, triple negative, Ki 67 25%   11/02/2021 Initial Diagnosis   Malignant neoplasm of upper-outer quadrant of female breast ([place])    Interval History  She arrived to the initial visit with her daughter Charlena Cross. She didn't feel any lump in her breast.  She has underlying DM, HTN, dyslipidemia, OSA, CHD and history of arthritis. She says back in 1999, she was thought to be some bone cancer, but when she went back, they couldn't find it. DM is well controlled, Hb A1c is 6.4%. ECHO 50-55%. Nov 2022. History of PE in 2008, took anticoagulation.  MEDICAL HISTORY:  Past Medical History:  Diagnosis Date   Allergy    Anginal pain  ([place])    a. NL cath in 2008;  b. Myoview 03/2011: dec uptake along mid anterior wall on stress imaging -> ? attenuation vs. ischemia, EF 65%;  c. 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 2008 -> coumadin x 6 mos.   Rheumatoid arthritis ([place])    Sleep apnea    on CPAP 02/2018   SOB (shortness of breath)    Thyroid disease    TIA (transient ischemic attack)    Urinary urgency    Vitamin D deficiency     SURGICAL HISTORY: Past Surgical  History:  Procedure Laterality Date   ABDOMINAL HYSTERECTOMY  2005   APPENDECTOMY     CARDIAC CATHETERIZATION     Normal   CARDIAC CATHETERIZATION N/A 09/30/2014   Procedure: Left Heart Cath and Coronary Angiography;  Surgeon: Sherren Mocha, MD;  Location: [facility];  Service: Cardiovascular;  Laterality: N/A;   LAPAROSCOPIC APPENDECTOMY N/A 06/03/2012   Procedure: APPENDECTOMY LAPAROSCOPIC;  Surgeon: Stark Klein, MD;  Location: [facility];  Service: General;  Laterality: N/A;   Left knee surgery  2008   LEG SURGERY     TONSILLECTOMY     TOTAL HIP ARTHROPLASTY Left 06/11/2018   Procedure: LEFT TOTAL HIP ARTHROPLASTY ANTERIOR APPROACH;  Surgeon: Meredith Pel, MD;  Location: [facility];  Service: Orthopedics;  Laterality: Left;   TOTAL KNEE ARTHROPLASTY Left  08/22/2016   Procedure: TOTAL KNEE ARTHROPLASTY;  Surgeon: Meredith Pel, MD;  Location: [facility];  Service: Orthopedics;  Laterality: Left;   TUBAL LIGATION  1989    SOCIAL HISTORY: Social History   Socioeconomic History   Marital status: Divorced    Spouse name: Not on file   Number of children: 3   Years of education: Not on file   Highest education level: Not on file  Occupational History   Occupation: UNEMPLOYED    Employer: DISABLED  Tobacco Use   Smoking status: Former    Packs/day: 0.50    Years: 15.00    Total pack years: 7.50    Types: Cigarettes    Quit date: 04/24/1985    Years since quitting: 36.5   Smokeless tobacco: Never  Vaping Use   Vaping Use: Never used  Substance and Sexual Activity   Alcohol use: No    Alcohol/week: 0.0 standard drinks of alcohol   Drug use: Not Currently    Types: Marijuana   Sexual activity: Never  Other Topics Concern   Not on file  Social History Narrative   Lives in [place] by herself.  Disabled.  Caffeine 1 cup avg daily.  3 kids, 6 grandkids.     Water aerobics 3 times a week.   Social Determinants of Health   Financial Resource Strain: Not on file  Food Insecurity: Not on file  Transportation Needs: Not on file  Physical Activity: Not on file  Stress: Not on file  Social Connections: Not on file  Intimate Partner Violence: Not on file    FAMILY HISTORY: Family History  Problem Relation Age of Onset   Emphysema Mother    Arthritis Mother    Heart failure Mother        alive @ 49   Stroke Mother    Diabetes Mother    Hypertension Mother    Hyperlipidemia Mother    Depression Mother    Anxiety disorder Mother    Heart disease Mother    Alcoholism Mother    Depression Father    Heart disease Father        died @ 64's.   Stroke Father    Diabetes Father    Hyperlipidemia Father    Hypertension Father    Bipolar disorder Father    Sleep apnea Father    Alcoholism Father    Drug abuse Father    Sudden  death Father    Diabetes Sister    Asthma Brother    Colon cancer Maternal Grandfather    Heart disease Paternal Grandfather    Breast cancer Maternal Aunt    Esophageal cancer Neg Hx  Stomach cancer Neg Hx    Rectal cancer Neg Hx     ALLERGIES:  is allergic to hydrocodone-acetaminophen, penicillins, latex, and sulfa antibiotics.  MEDICATIONS:  Current Outpatient Medications  Medication Sig Dispense Refill   ACCU-CHEK GUIDE test strip      albuterol (PROVENTIL HFA;VENTOLIN HFA) 108 (90 Base) MCG/ACT inhaler Inhale 2 puffs into the lungs every 6 (six) hours as needed for wheezing or shortness of breath.     aspirin 81 MG chewable tablet Chew 81 mg by mouth daily.     atorvastatin (LIPITOR) 40 MG tablet Take 40 mg by mouth at bedtime.      busPIRone (BUSPAR) 30 MG tablet Take 30 mg by mouth 2 (two) times daily.     carvedilol (COREG) 6.25 MG tablet Take 1 tablet (6.25 mg total) by mouth 2 (two) times daily. 180 tablet 3   diclofenac Sodium (VOLTAREN) 1 % GEL Apply 2 g topically 4 (four) times daily as needed (joint/muscle pain).     furosemide (LASIX) 40 MG tablet Take 60 mg by mouth daily.     hydrOXYzine (VISTARIL) 25 MG capsule Take 25 mg by mouth 3 (three) times daily as needed for itching.     isosorbide dinitrate (ISORDIL) 20 MG tablet Take 20 mg by mouth daily.     lidocaine (LIDODERM) 5 % Place 1 patch onto the skin daily as needed for pain.     lisinopril (PRINIVIL,ZESTRIL) 20 MG tablet Take 20 mg by mouth daily.      loperamide (IMODIUM A-D) 2 MG tablet Take 4 mg by mouth 4 (four) times daily as needed for diarrhea or loose stools.      meloxicam (MOBIC) 15 MG tablet meloxicam 15 mg tablet  TAKE 1 TABLET (15 MG) BY ORAL ROUTE ONCE DAILY FOR 90 DAYS     metFORMIN (GLUCOPHAGE) 500 MG tablet Take 1 tablet (500 mg total) by mouth daily with breakfast. 30 tablet 0   MYRBETRIQ 25 MG TB24 tablet Take 1 tablet (25 mg total) by mouth daily. 30 tablet 0   nystatin (MYCOSTATIN/NYSTOP)  powder Apply 1 application topically 3 (three) times daily as needed for rash.     pantoprazole (PROTONIX) 40 MG tablet Take 1 tablet (40 mg total) by mouth 2 (two) times daily. 60 tablet 0   potassium chloride (KLOR-CON M10) 10 MEQ tablet Take 1 tablet (10 mEq total) by mouth 2 (two) times daily. 60 tablet 1   Semaglutide,0.25 or 0.5MG/DOS, (OZEMPIC, 0.25 OR 0.5 MG/DOSE,) 2 MG/1.5ML SOPN Inject 0.5 mg into the skin once a week. 3 mL 0   sertraline (ZOLOFT) 25 MG tablet Take 1 tablet (25 mg total) by mouth daily. 90 tablet 0   Vitamin D, Ergocalciferol, (DRISDOL) 1.25 MG (50000 UNIT) CAPS capsule Take 1 capsule (50,000 Units total) by mouth every 7 (seven) days. 4 capsule 0   No current facility-administered medications for this visit.    REVIEW OF SYSTEMS:   Constitutional: Denies fevers, chills or abnormal night sweats Eyes: Denies blurriness of vision, double vision or watery eyes Ears, nose, mouth, throat, and face: Denies mucositis or sore throat Respiratory: Denies cough, dyspnea or wheezes Cardiovascular: Denies palpitation, chest discomfort or lower extremity swelling Gastrointestinal:  Denies nausea, heartburn or change in bowel habits Skin: Denies abnormal skin rashes Lymphatics: Denies new lymphadenopathy or easy bruising Neurological:Denies numbness, tingling or new weaknesses Behavioral/Psych: Mood is stable, no new changes  Breast: Denies any palpable lumps or discharge All other systems were reviewed with  the patient and are negative.  PHYSICAL EXAMINATION: ECOG PERFORMANCE STATUS: 1 - Symptomatic but completely ambulatory  Vitals:   11/02/21 0837  BP: (!) 143/77  Pulse: 70  Resp: 18  Temp: 97.9 F (36.6 C)  SpO2: 98%   Filed Weights   11/02/21 0837  Weight: 201 lb 12.8 oz (91.5 kg)    GENERAL:alert, no distress and comfortable SKIN: skin color, texture, turgor are normal, no rashes or significant lesions EYES: normal, conjunctiva are pink and non-injected,  sclera clear OROPHARYNX:no exudate, no erythema and lips, buccal mucosa, and tongue normal  NECK: supple, thyroid normal size, non-tender, without nodularity LYMPH:  no palpable lymphadenopathy in the cervical, axillary or inguinal LUNGS: clear to auscultation and percussion with normal breathing effort HEART: regular rate & rhythm and no murmurs and no lower extremity edema ABDOMEN:abdomen soft, non-tender and normal bowel sounds Musculoskeletal:no cyanosis of digits and no clubbing  PSYCH: alert & oriented x 3 with fluent speech NEURO: no focal motor/sensory deficits BREAST: No palpable masses bilateral breasts. No palpable lymphadenopathy  LABORATORY DATA:  I have reviewed the data as listed Lab Results  Component Value Date   WBC 5.3 11/02/2021   HGB 13.0 11/02/2021   HCT 39.6 11/02/2021   MCV 87.8 11/02/2021   PLT 222 11/02/2021   Lab Results  Component Value Date   NA 142 11/02/2021   K 3.8 11/02/2021   CL 107 11/02/2021   CO2 30 11/02/2021    RADIOGRAPHIC STUDIES: I have personally reviewed the radiological reports and agreed with the findings in the report.  ASSESSMENT AND PLAN:  Malignant neoplasm of upper-outer quadrant of female breast [facility]) This is a very pleasant 61 year old female patient with past medical history significant for congestive heart failure, coronary artery disease, diabetes, hypertension, dyslipidemia history of pulmonary embolism back in 2008 for which she needed anticoagulation, rheumatoid arthritis, obstructive sleep apnea who is newly diagnosed with right breast IDC noticed on screening mammogram.  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  Given small tumor size and despite triple negative biology, I agree it is reasonable to proceed with upfront surgery followed by consideration of adjuvant chemotherapy if final pathology also confirms the diagnosis.  She has multiple comorbidities  at baseline hence adjuvant TC x4 cycles could be a reasonable chemotherapy option versus CMF every 21 days for 8 cycles.  We have discussed about mechanism of action of chemotherapy, adverse effects of chemotherapy including but not limited to fatigue, nausea, vomiting, increased risk of infections, neuropathy.   She understands that some of the side effects can be permanent and life-threatening.  We have also discussed about risk of secondary malignancies with chemotherapy. She has no underlying peripheral neuropathy from DM.  No role for antiestrogen therapy, triple negative biology.  She will return to clinic after surgery to review final pathology and to discuss additional recommendations.  Thank you for consulting [name] in the care of this patient.  Please not hesitate to contact us with any additional questions or concerns  Total time spent: 60 minutes including history, physical exam, review of records, counseling and coordination of care All questions were answered. The patient knows to call the clinic with any problems, questions or concerns.    [name] Pike, MD 11/02/21

## 2021-11-02 NOTE — H&P (View-Only) (Signed)
Subjective   Chief Complaint: Breast Cancer   Breast [place] 11/02/21 - [place]/ [place]  History of Present Illness: [name] is a 61 y.o. female who is seen today as an office consultation at the request of Dr. Chryl Heck for evaluation of Breast Cancer .   This is a 60 year old female who recently underwent routine screening mammogram which revealed a possible mass in the right upper outer quadrant.  Further work-up revealed an 8 x 6 x 7 mm irregular mass at the right breast 1 o'clock 13 cm from nipple.  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  The patient has multiple medical comorbidities including diabetes, heart failure, history of stroke, pulmonary hypertension, and anxiety.  She is on multiple medications.  No blood thinners other than aspirin.  Her cardiologist is Dr. [name].  No previous breast surgeries.  She had a maternal aunt who had breast cancer in her 88s.  She is accompanied by one of her daughters today.   Review of Systems: A complete review of systems was obtained from the patient.  I have reviewed this information and discussed as appropriate with the patient.  See HPI as well for other ROS.  Review of Systems  Constitutional:  Positive for malaise/fatigue.  HENT: Negative.    Eyes: Negative.   Respiratory: Negative.    Cardiovascular:  Positive for chest pain.  Gastrointestinal:  Positive for abdominal pain and heartburn.  Genitourinary: Negative.   Musculoskeletal:  Positive for back pain and joint pain.  Skin: Negative.   Neurological:  Positive for headaches.  Endo/Heme/Allergies: Negative.   Psychiatric/Behavioral:  The patient is nervous/anxious.      Medical History: Past Medical History:  Diagnosis Date   Anxiety    Asthma, unspecified asthma severity, unspecified whether complicated, unspecified whether persistent    Diabetes mellitus without complication (CMS-HCC)    DVT (deep venous thrombosis)  (CMS-HCC)    GERD (gastroesophageal reflux disease)    Glaucoma (increased eye pressure)    History of stroke    Hyperlipidemia    Hypertension    Pulmonary hypertension (CMS-HCC)    Sleep apnea    Thyroid disease     Patient Active Problem List  Diagnosis   Abnormal cardiac function test   Arthritis of knee   Back pain   CAD (coronary artery disease)   Degeneration of lumbar or lumbosacral intervertebral disc   Depression   Diabetes mellitus (CMS-HCC)   Diastolic CHF (CMS-HCC)   Dyslipidemia   Dyspnea on exertion   Essential hypertension   Glaucoma   Hypersomnia with sleep apnea    Past Surgical History:  Procedure Laterality Date   cardiac catherization     HIP ARTHROSCOPY     KNEE ARTHROSCOPY       Allergies  Allergen Reactions   Penicillins Itching, Other (See Comments) and Swelling    Has patient had a PCN reaction causing immediate rash, facial/tongue/throat swelling, SOB or lightheadedness with hypotension: YES  Has patient had a PCN reaction causing severe rash involving mucus membranes or skin necrosis: NO  Has patient had a PCN reaction that required hospitalization NO  Has patient had a PCN reaction occurring within the last 10 years: NO  If all of the above answers are "NO", then may proceed with Cephalosporin use.   Hydrocodone-Acetaminophen Other (See Comments) and Itching    confusion   Latex Rash   Sulfa (Sulfonamide Antibiotics) Diarrhea, Itching and Rash  Current Outpatient Medications on File Prior to Visit  Medication Sig Dispense Refill   albuterol 90 mcg/actuation inhaler Inhale into the lungs     aspirin 81 MG chewable tablet Take by mouth     atorvastatin (LIPITOR) 40 MG tablet TAKE 1 TABLET (40 MG) BY ORAL ROUTE ONCE DAILY AT NIGHT FOR 90 NIGHTS     busPIRone (BUSPAR) 30 MG tablet TAKE 1 TABLET (30MG) BY ORAL ROUTE TWICE DAILY FOR ANXIETY FOR 90 DAYS     carvediloL (COREG) 6.25 MG tablet Take 6.25 mg by mouth 2 (two) times daily      diclofenac (VOLTAREN) 75 MG EC tablet Take 1 tablet twice a day by oral route.     ergocalciferol, vitamin D2, 1,250 mcg (50,000 unit) capsule Take 50,000 Units by mouth every 7 (seven) days     FUROsemide (LASIX) 40 MG tablet TAKE 1 1/2 TABLETS (60 MG) BY ORAL ROUTE ONCE DAILY FOR 90 DAYS     hydrOXYzine pamoate (VISTARIL) 25 MG capsule TAKE 1 CAPSULE (25 MG) BY ORAL ROUTE 3 TIMES PER DAY AS NEEDED FOR ITCHING     isosorbide dinitrate (ISORDIL) 20 MG tablet Take 1 tablet by mouth once daily     KLOR-CON M10 10 mEq ER tablet Take 1 tablet by mouth 2 (two) times daily     lidocaine (LIDODERM) 5 % patch APPLY 1 PATCH BY TOPICAL ROUTE ONCE DAILY (MAY WEAR UP TO 12HOURS.)     lisinopriL (ZESTRIL) 20 MG tablet Take 1 tablet by mouth once daily     loperamide (IMODIUM) 2 mg capsule as directed     meloxicam (MOBIC) 15 MG tablet meloxicam 15 mg tablet  TAKE 1 TABLET (15 MG) BY ORAL ROUTE ONCE DAILY FOR 90 DAYS     metFORMIN (GLUCOPHAGE) 500 MG tablet Take 500 mg by mouth daily with breakfast     MYRBETRIQ 25 mg ER Tablet TAKE 1 TABLET BY MOUTH EVERY DAY SWALLOWING WHOLE WITH WATER . DO NOT CRUSH CHEW AND/OR DIVIDE     nystatin (MYCOSTATIN) 100,000 unit/gram powder APPLY TO THE AFFECTED AREA(S) BY TOPICAL ROUTE 3 TIMES PER DAY TO BILATERAL BREAST RASH FOR 7 DAYS     omeprazole (PRILOSEC) 20 MG DR capsule Take 1 capsule by mouth once daily     OZEMPIC 0.25 mg or 0.5 mg(2 mg/1.5 mL) pen injector INJECT 0.5 MG INTO THE SKIN ONCE A WEEK. DX:E11.69     pantoprazole (PROTONIX) 40 MG DR tablet Take 1 tablet by mouth 2 (two) times daily     sertraline (ZOLOFT) 25 MG tablet Take 1 tablet by mouth once daily     No current facility-administered medications on file prior to visit.    Family History  Problem Relation Age of Onset   Stroke Mother    High blood pressure (Hypertension) Mother    Hyperlipidemia (Elevated cholesterol) Mother    Diabetes Mother    Colon cancer Mother    Stroke Father    Diabetes  Father    Myocardial Infarction (Heart attack) Father    Diabetes Sister      Social History   Tobacco Use  Smoking Status Former   Types: Cigarettes   Quit date: 1987   Years since quitting: 36.5  Smokeless Tobacco Never     Social History   Socioeconomic History   Marital status: Single  Tobacco Use   Smoking status: Former    Types: Cigarettes    Quit date: 1987    Years  since quitting: 36.5   Smokeless tobacco: Never  Substance and Sexual Activity   Alcohol use: Never   Drug use: Never    Objective:    Physical Exam   Constitutional:  WDWN in NAD, conversant, no obvious deformities; lying in bed comfortably Eyes:  Pupils equal, round; sclera anicteric; moist conjunctiva; no lid lag HENT:  Oral mucosa moist; good dentition  Neck:  No masses palpated, trachea midline; no thyromegaly Lungs:  CTA bilaterally; normal respiratory effort Breasts:  symmetric, no nipple changes; no palpable masses or lymphadenopathy on either side CV:  Regular rate and rhythm; no murmurs; extremities well-perfused with no edema Abd:  +bowel sounds, soft, non-tender, no palpable organomegaly; no palpable hernias Musc: Normal gait; no apparent clubbing or cyanosis in extremities Lymphatic:  No palpable cervical or axillary lymphadenopathy Skin:  Warm, dry; no sign of jaundice Psychiatric - alert and oriented x 4; calm mood and affect   Labs, Imaging and Diagnostic Testing: Breast, right, needle core biopsy, 1 o'clock, 13 cmfn - INVASIVE DUCTAL CARCINOMA - SEE COMMENT Microscopic Comment   CLINICAL DATA:  Callback from screening mammogram for possible mass right breast   EXAM: DIGITAL DIAGNOSTIC UNILATERAL RIGHT MAMMOGRAM WITH TOMOSYNTHESIS AND CAD; ULTRASOUND RIGHT BREAST LIMITED   TECHNIQUE: Right digital diagnostic mammography and breast tomosynthesis was performed. The images were evaluated with computer-aided detection.; Targeted ultrasound examination of the right  breast was performed   COMPARISON:  Previous exam(s).   ACR Breast Density Category b: There are scattered areas of fibroglandular density.   FINDINGS: Spot compression cc and MLO views of the right breast are submitted. The previously questioned mass in the medial upper right breast is persistent.   Targeted ultrasound is performed, showing a 8 x 6 x 7 mm irregular mass at the right breast 1 o'clock 13 cm from nipple correlating to the mammographic mass. Ultrasound of the right axilla is negative.   IMPRESSION: Highly suspicious findings.   RECOMMENDATION: Ultrasound-guided core biopsy of right breast mass.   I have discussed the findings and recommendations with the patient. If applicable, a reminder letter will be sent to the patient regarding the next appointment.   BI-RADS CATEGORY  5: Highly suggestive of malignancy.     Electronically Signed   By: Abelardo Diesel M.D.   On: 10/17/2021 12:43    Assessment and Plan:  Diagnoses and all orders for this visit:  Malignant neoplasm of upper-inner quadrant of right female breast, unspecified estrogen receptor status (CMS-HCC)    Invasive ductal carcinoma of the right breast located at 1:00, 13 cm from the nipple, measuring 8 mm across.  Grade 3 invasive ductal carcinoma, triple negative.  I spent a significant amount of time with the patient and her daughter discussing surgical options including mastectomy versus breast conserving therapy.  This is a relatively small cancer.  She has some medical comorbidities that make her higher risk for perioperative complications with surgery.  Recommend right breast radioactive seed localized lumpectomy and sentinel lymph node biopsy.  The surgical procedure has been discussed with the patient.  Potential risks, benefits, alternative treatments, and expected outcomes have been explained.  All of the patient's questions at this time have been answered.  The likelihood of reaching the  patient's treatment goal is good.  The patient understand the proposed surgical procedure and wishes to proceed.  We will obtain cardiac clearance from her cardiologist prior to scheduling surgery.  I spent approximately 55 minutes with this patient as well as  discussing her care with other specialists.  Jarmel Linhardt Jearld Adjutant, MD  11/02/2021 11:19 AM

## 2021-11-02 NOTE — Progress Notes (Signed)
Chief Complaint:   OBESITY [name] is here to discuss her progress with her obesity treatment plan along with follow-up of her obesity related diagnoses. [name] is on the Category 2 Plan and states she is following her eating plan approximately 70-75% of the time. [name] states she is exercising 0 minutes 0 times per week.  Today's visit was #: 7 Starting weight: 209 lbs Starting date: 06/01/2021 Today's weight: 194 lbs Today's date: 10/31/2021 Total lbs lost to date: 15 lbs Total lbs lost since last in-office visit: 0  Interim History: [name] just found out that she has breast cancer--per pathology report invasive ductal carcinoma. She has also had some unfortunates experiences with washer and dryer replacements. She started Ozempic with no GI side effects. Next few weeks she plans to continue with Cat 2.  Subjective:   1. Type 2 diabetes mellitus with hyperglycemia, without long-term current use of insulin (HCC) [name] is currently taking Metformin and Ozempic. Denies GI side effects.  2. Vitamin D deficiency [name] is currently taking prescription Vit D 50,000 IU once a week. Denies any nausea, vomiting or muscle weakness. She notes fatigue.  3. Malignant neoplasm of upper-inner quadrant of right female breast, unspecified estrogen receptor status ([name]) [name] diagnosed last week, biopsy result of  invasive ductal carcinoma. She has a breast clinic appointment in 2 days.  4. Anxiety and depression [name] is doing well on Sertraline 25 mg, no side effects noted. Denies suicidal ideas, and homicidal ideas.  Assessment/Plan:   1. Type 2 diabetes mellitus with hyperglycemia, without long-term current use of insulin (HCC) We will refill Metformin 500 mg by mouth daily for 1 month with 0 refills.  -Refill metFORMIN (GLUCOPHAGE) 500 MG tablet; Take 1 tablet (500 mg total) by mouth daily with breakfast.  Dispense: 30 tablet; Refill: 0  We will refill Ozempic 0.5 mg SubQ once  weekly for 1 month with 0 refills.  -Refill Semaglutide,0.25 or 0.'5MG'$ /DOS, (OZEMPIC, 0.25 OR 0.5 MG/DOSE,) 2 MG/1.5ML SOPN; Inject 0.5 mg into the skin once a week.  Dispense: 3 mL; Refill: 0  2. Vitamin D deficiency We will refill Vit d 50,000 IU once weekly for 1 month with 0 refills.  -Refill Vitamin D, Ergocalciferol, (DRISDOL) 1.25 MG (50000 UNIT) CAPS capsule; Take 1 capsule (50,000 Units total) by mouth every 7 (seven) days.  Dispense: 4 capsule; Refill: 0  3. Malignant neoplasm of upper-inner quadrant of right female breast, unspecified estrogen receptor status ([place]) I will follow up with [name] on treatment after breast clinic.  4. Anxiety and depression We will refill Sertraline 25 mg by mouth daily for 1 month with 0 refills.  -Refill sertraline (ZOLOFT) 25 MG tablet; Take 1 tablet (25 mg total) by mouth daily.  Dispense: 90 tablet; Refill: 0  5. Obesity with current BMI of 39.3 [name] is currently in the action stage of change. As such, her goal is to continue with weight loss efforts. She has agreed to the Category 2 Plan.   Exercise goals: All adults should avoid inactivity. Some physical activity is better than none, and adults who participate in any amount of physical activity gain some health benefits.  Behavioral modification strategies: increasing lean protein intake, meal planning and cooking strategies, keeping healthy foods in the home, and planning for success.  [name] has agreed to follow-up with our clinic in 4 weeks. She was informed of the importance of frequent follow-up visits to maximize her success with intensive lifestyle modifications for her multiple health  conditions.   Objective:   Blood pressure 103/68, pulse 85, temperature 98.3 F (36.8 C), height '4\' 11"'$  (1.499 m), weight 194 lb (88 kg), SpO2 96 %. Body mass index is 39.18 kg/m.  General: Cooperative, alert, well developed, in no acute distress. HEENT: Conjunctivae and lids  unremarkable. Cardiovascular: Regular rhythm.  Lungs: Normal work of breathing. Neurologic: No focal deficits.   Lab Results  Component Value Date   CREATININE 1.07 (H) 11/02/2021   BUN 16 11/02/2021   NA 142 11/02/2021   K 3.8 11/02/2021   CL 107 11/02/2021   CO2 30 11/02/2021   Lab Results  Component Value Date   ALT 18 11/02/2021   AST 13 (L) 11/02/2021   ALKPHOS 52 11/02/2021   BILITOT 0.3 11/02/2021   Lab Results  Component Value Date   HGBA1C 6.4 (H) 09/28/2021   HGBA1C 8.4 (H) 03/08/2021   HGBA1C 7.6 (H) 02/03/2020   HGBA1C 7.5 (H) 10/07/2019   HGBA1C 6.3 (H) 12/26/2018   Lab Results  Component Value Date   INSULIN 22.1 09/28/2021   INSULIN 26.8 (H) 06/01/2021   INSULIN 44.4 (H) 02/03/2020   INSULIN 28.1 (H) 12/26/2018   INSULIN 20.2 03/04/2018   Lab Results  Component Value Date   TSH 0.506 04/23/2021   Lab Results  Component Value Date   CHOL 127 09/28/2021   HDL 48 09/28/2021   LDLCALC 60 09/28/2021   TRIG 99 09/28/2021   CHOLHDL 4.2 04/23/2011   CHOLHDL 4.3 04/23/2011   Lab Results  Component Value Date   VD25OH 39.8 09/28/2021   VD25OH 30.0 06/01/2021   VD25OH 44.6 02/03/2020   Lab Results  Component Value Date   WBC 5.3 11/02/2021   HGB 13.0 11/02/2021   HCT 39.6 11/02/2021   MCV 87.8 11/02/2021   PLT 222 11/02/2021   No results found for: "IRON", "TIBC", "FERRITIN"  Obesity Behavioral Intervention:   Approximately 15 minutes were spent on the discussion below.  ASK: We discussed the diagnosis of obesity with [name] today and [name] agreed to give [name] permission to discuss obesity behavioral modification therapy today.  ASSESS: [name] has the diagnosis of obesity and her BMI today is 39.3. [name] is in the action stage of change.   ADVISE: [name] was educated on the multiple health risks of obesity as well as the benefit of weight loss to improve her health. She was advised of the need for long term treatment and the importance  of lifestyle modifications to improve her current health and to decrease her risk of future health problems.  AGREE: Multiple dietary modification options and treatment options were discussed and [name] agreed to follow the recommendations documented in the above note.  ARRANGE: [name] was educated on the importance of frequent visits to treat obesity as outlined per CMS and USPSTF guidelines and agreed to schedule her next follow up appointment today.  Attestation Statements:   Reviewed by clinician on day of visit: allergies, medications, problem list, medical history, surgical history, family history, social history, and previous encounter notes.  I, [name], RMA am acting as transcriptionist for Coralie Common, MD.  I have reviewed the above documentation for accuracy and completeness, and I agree with the above. - Coralie Common, MD

## 2021-11-02 NOTE — Progress Notes (Signed)
[facility]  Initial Assessment   [name] is a 61 y.o. year old female accompanied by daughter, [name]. Clinical Social Work was referred by  [facility]  for assessment of psychosocial needs.   SDOH (Social Determinants of Health) assessments performed: Yes SDOH Interventions    Flowsheet Row Most Recent Value  SDOH Interventions   Food Insecurity Interventions Intervention Not Indicated  Financial Strain Interventions Other (Comment)  [cancer foundations]  Transportation Interventions Intervention Not Indicated       SDOH Screenings   Alcohol Screen: Not on file  Depression (PHQ2-9): Medium Risk (06/01/2021)   Depression (PHQ2-9)    PHQ-2 Score: 9  Financial Resource Strain: Medium Risk (11/02/2021)   Overall Financial Resource Strain (CARDIA)    Difficulty of Paying Living Expenses: Somewhat hard  Food Insecurity: Unknown (11/02/2021)   Hunger Vital Sign    Worried About Charity fundraiser in the Last Year: Not on file    Ran Out of Food in the Last Year: Never true  Housing: Not on file  Physical Activity: Not on file  Social Connections: Not on file  Stress: Not on file  Tobacco Use: Medium Risk (11/02/2021)   Patient History    Smoking Tobacco Use: Former    Smokeless Tobacco Use: Never    Passive Exposure: Not on file  Transportation Needs: No Transportation Needs (11/02/2021)   PRAPARE - Transportation    Lack of Transportation (Medical): No    Lack of Transportation (Non-Medical): No     Distress Screen completed: Yes    11/02/2021   12:43 PM  ONCBCN DISTRESS SCREENING  Screening Type Initial Screening  Distress experienced in past week (1-10) 10  Family Problem type Children  Emotional problem type Depression;Nervousness/Anxiety;Adjusting to illness;Boredom;Adjusting to appearance changes  Spiritual/Religous concerns type Relating to God;Loss of sense of purpose  Information Concerns Type Lack of info about diagnosis;Lack of info about  treatment  Physical Problem type Pain;Loss of appetitie;Skin dry/itchy      Family/Social Information:  Housing Arrangement: patient lives alone. Daughter lives a few minutes away, goddaughter lives next door Family members/support persons in your life? Daughter & goddaughter Transportation concerns: no  Employment: Legally disabled .  Income source: Banker concerns:  Yes- can afford basic needs but budget is tight due to fixed income Type of concern: Utilities and Rent/ mortgage Food access concerns: no Religious or spiritual practice: Not known Services Currently in place:  Medicare & Medicaid, SS disability, medication for anxiety/depression  Coping/ Adjustment to diagnosis: Patient understands treatment plan and what happens next? yes, feels that her stress is down to a 5 after speaking with the medical team Concerns about diagnosis and/or treatment: Overwhelmed by information and Afraid of cancer Patient reported stressors: Depression, Anxiety/ nervousness, Adjusting to my illness, and Relating to God Hopes and/or priorities: complete treatment and be cancer-free Current coping skills/ strengths: Capable of independent living  and Supportive family/friends     SUMMARY: Current SDOH Barriers:  Financial constraints related to fixed income  Clinical Social Work Clinical Goal(s):  Explore community resource options for unmet needs related to:  Financial Strain  by completing applications for assistance from cancer foundations  Interventions: Discussed common feeling and emotions when being diagnosed with cancer, and the importance of support during treatment Informed patient of the support team roles and support services at [facility] Provided CSW contact information and encouraged patient to call with any questions or concerns Provided information on cancer foundations.  Will provide applications at next visit   Follow Up Plan:  CSW will see patient  during upcoming visits to provide paper copies of assistance applications Patient verbalizes understanding of plan: Yes    [name] E Tara Aboud, LCSW   Patient is participating in a Managed Medicaid Plan:  Yes

## 2021-11-02 NOTE — H&P (Signed)
Subjective   Chief Complaint: Breast Cancer   Breast [place] 11/02/21 - [place] [place]  History of Present Illness: [name] is a 61 y.o. female who is seen today as an office consultation at the request of Dr. Chryl Heck for evaluation of Breast Cancer .   This is a 61 year old female who recently underwent routine screening mammogram which revealed a possible mass in the right upper outer quadrant.  Further work-up revealed an 8 x 6 x 7 mm irregular mass at the right breast 1 o'clock 13 cm from nipple.  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  The patient has multiple medical comorbidities including diabetes, heart failure, history of stroke, pulmonary hypertension, and anxiety.  She is on multiple medications.  No blood thinners other than aspirin.  Her cardiologist is Dr. [name].  No previous breast surgeries.  She had a maternal aunt who had breast cancer in her 57s.  She is accompanied by one of her daughters today.   Review of Systems: A complete review of systems was obtained from the patient.  I have reviewed this information and discussed as appropriate with the patient.  See HPI as well for other ROS.  Review of Systems  Constitutional:  Positive for malaise/fatigue.  HENT: Negative.    Eyes: Negative.   Respiratory: Negative.    Cardiovascular:  Positive for chest pain.  Gastrointestinal:  Positive for abdominal pain and heartburn.  Genitourinary: Negative.   Musculoskeletal:  Positive for back pain and joint pain.  Skin: Negative.   Neurological:  Positive for headaches.  Endo/Heme/Allergies: Negative.   Psychiatric/Behavioral:  The patient is nervous/anxious.      Medical History: Past Medical History:  Diagnosis Date   Anxiety    Asthma, unspecified asthma severity, unspecified whether complicated, unspecified whether persistent    Diabetes mellitus without complication (CMS-HCC)    DVT (deep venous thrombosis)  (CMS-HCC)    GERD (gastroesophageal reflux disease)    Glaucoma (increased eye pressure)    History of stroke    Hyperlipidemia    Hypertension    Pulmonary hypertension (CMS-HCC)    Sleep apnea    Thyroid disease     Patient Active Problem List  Diagnosis   Abnormal cardiac function test   Arthritis of knee   Back pain   CAD (coronary artery disease)   Degeneration of lumbar or lumbosacral intervertebral disc   Depression   Diabetes mellitus (CMS-HCC)   Diastolic CHF (CMS-HCC)   Dyslipidemia   Dyspnea on exertion   Essential hypertension   Glaucoma   Hypersomnia with sleep apnea    Past Surgical History:  Procedure Laterality Date   cardiac catherization     HIP ARTHROSCOPY     KNEE ARTHROSCOPY       Allergies  Allergen Reactions   Penicillins Itching, Other (See Comments) and Swelling    Has patient had a PCN reaction causing immediate rash, facial/tongue/throat swelling, SOB or lightheadedness with hypotension: YES  Has patient had a PCN reaction causing severe rash involving mucus membranes or skin necrosis: NO  Has patient had a PCN reaction that required hospitalization NO  Has patient had a PCN reaction occurring within the last 10 years: NO  If all of the above answers are "NO", then may proceed with Cephalosporin use.   Hydrocodone-Acetaminophen Other (See Comments) and Itching    confusion   Latex Rash   Sulfa (Sulfonamide Antibiotics) Diarrhea, Itching and Rash  Current Outpatient Medications on File Prior to Visit  Medication Sig Dispense Refill   albuterol 90 mcg/actuation inhaler Inhale into the lungs     aspirin 81 MG chewable tablet Take by mouth     atorvastatin (LIPITOR) 40 MG tablet TAKE 1 TABLET (40 MG) BY ORAL ROUTE ONCE DAILY AT NIGHT FOR 90 NIGHTS     busPIRone (BUSPAR) 30 MG tablet TAKE 1 TABLET (30MG) BY ORAL ROUTE TWICE DAILY FOR ANXIETY FOR 90 DAYS     carvediloL (COREG) 6.25 MG tablet Take 6.25 mg by mouth 2 (two) times daily      diclofenac (VOLTAREN) 75 MG EC tablet Take 1 tablet twice a day by oral route.     ergocalciferol, vitamin D2, 1,250 mcg (50,000 unit) capsule Take 50,000 Units by mouth every 7 (seven) days     FUROsemide (LASIX) 40 MG tablet TAKE 1 1/2 TABLETS (60 MG) BY ORAL ROUTE ONCE DAILY FOR 90 DAYS     hydrOXYzine pamoate (VISTARIL) 25 MG capsule TAKE 1 CAPSULE (25 MG) BY ORAL ROUTE 3 TIMES PER DAY AS NEEDED FOR ITCHING     isosorbide dinitrate (ISORDIL) 20 MG tablet Take 1 tablet by mouth once daily     KLOR-CON M10 10 mEq ER tablet Take 1 tablet by mouth 2 (two) times daily     lidocaine (LIDODERM) 5 % patch APPLY 1 PATCH BY TOPICAL ROUTE ONCE DAILY (MAY WEAR UP TO 12HOURS.)     lisinopriL (ZESTRIL) 20 MG tablet Take 1 tablet by mouth once daily     loperamide (IMODIUM) 2 mg capsule as directed     meloxicam (MOBIC) 15 MG tablet meloxicam 15 mg tablet  TAKE 1 TABLET (15 MG) BY ORAL ROUTE ONCE DAILY FOR 90 DAYS     metFORMIN (GLUCOPHAGE) 500 MG tablet Take 500 mg by mouth daily with breakfast     MYRBETRIQ 25 mg ER Tablet TAKE 1 TABLET BY MOUTH EVERY DAY SWALLOWING WHOLE WITH WATER . DO NOT CRUSH CHEW AND/OR DIVIDE     nystatin (MYCOSTATIN) 100,000 unit/gram powder APPLY TO THE AFFECTED AREA(S) BY TOPICAL ROUTE 3 TIMES PER DAY TO BILATERAL BREAST RASH FOR 7 DAYS     omeprazole (PRILOSEC) 20 MG DR capsule Take 1 capsule by mouth once daily     OZEMPIC 0.25 mg or 0.5 mg(2 mg/1.5 mL) pen injector INJECT 0.5 MG INTO THE SKIN ONCE A WEEK. DX:E11.69     pantoprazole (PROTONIX) 40 MG DR tablet Take 1 tablet by mouth 2 (two) times daily     sertraline (ZOLOFT) 25 MG tablet Take 1 tablet by mouth once daily     No current facility-administered medications on file prior to visit.    Family History  Problem Relation Age of Onset   Stroke Mother    High blood pressure (Hypertension) Mother    Hyperlipidemia (Elevated cholesterol) Mother    Diabetes Mother    Colon cancer Mother    Stroke Father    Diabetes  Father    Myocardial Infarction (Heart attack) Father    Diabetes Sister      Social History   Tobacco Use  Smoking Status Former   Types: Cigarettes   Quit date: 1987   Years since quitting: 36.5  Smokeless Tobacco Never     Social History   Socioeconomic History   Marital status: Single  Tobacco Use   Smoking status: Former    Types: Cigarettes    Quit date: 1987    Years  since quitting: 36.5   Smokeless tobacco: Never  Substance and Sexual Activity   Alcohol use: Never   Drug use: Never    Objective:    Physical Exam   Constitutional:  WDWN in NAD, conversant, no obvious deformities; lying in bed comfortably Eyes:  Pupils equal, round; sclera anicteric; moist conjunctiva; no lid lag HENT:  Oral mucosa moist; good dentition  Neck:  No masses palpated, trachea midline; no thyromegaly Lungs:  CTA bilaterally; normal respiratory effort Breasts:  symmetric, no nipple changes; no palpable masses or lymphadenopathy on either side CV:  Regular rate and rhythm; no murmurs; extremities well-perfused with no edema Abd:  +bowel sounds, soft, non-tender, no palpable organomegaly; no palpable hernias Musc: Normal gait; no apparent clubbing or cyanosis in extremities Lymphatic:  No palpable cervical or axillary lymphadenopathy Skin:  Warm, dry; no sign of jaundice Psychiatric - alert and oriented x 4; calm mood and affect   Labs, Imaging and Diagnostic Testing: Breast, right, needle core biopsy, 1 o'clock, 13 cmfn - INVASIVE DUCTAL CARCINOMA - SEE COMMENT Microscopic Comment   CLINICAL DATA:  Callback from screening mammogram for possible mass right breast   EXAM: DIGITAL DIAGNOSTIC UNILATERAL RIGHT MAMMOGRAM WITH TOMOSYNTHESIS AND CAD; ULTRASOUND RIGHT BREAST LIMITED   TECHNIQUE: Right digital diagnostic mammography and breast tomosynthesis was performed. The images were evaluated with computer-aided detection.; Targeted ultrasound examination of the right  breast was performed   COMPARISON:  Previous exam(s).   ACR Breast Density Category b: There are scattered areas of fibroglandular density.   FINDINGS: Spot compression cc and MLO views of the right breast are submitted. The previously questioned mass in the medial upper right breast is persistent.   Targeted ultrasound is performed, showing a 8 x 6 x 7 mm irregular mass at the right breast 1 o'clock 13 cm from nipple correlating to the mammographic mass. Ultrasound of the right axilla is negative.   IMPRESSION: Highly suspicious findings.   RECOMMENDATION: Ultrasound-guided core biopsy of right breast mass.   I have discussed the findings and recommendations with the patient. If applicable, a reminder letter will be sent to the patient regarding the next appointment.   BI-RADS CATEGORY  5: Highly suggestive of malignancy.     Electronically Signed   By: Abelardo Diesel M.D.   On: 10/17/2021 12:43    Assessment and Plan:  Diagnoses and all orders for this visit:  Malignant neoplasm of upper-inner quadrant of right female breast, unspecified estrogen receptor status (CMS-HCC)    Invasive ductal carcinoma of the right breast located at 1:00, 13 cm from the nipple, measuring 8 mm across.  Grade 3 invasive ductal carcinoma, triple negative.  I spent a significant amount of time with the patient and her daughter discussing surgical options including mastectomy versus breast conserving therapy.  This is a relatively small cancer.  She has some medical comorbidities that make her higher risk for perioperative complications with surgery.  Recommend right breast radioactive seed localized lumpectomy and sentinel lymph node biopsy.  The surgical procedure has been discussed with the patient.  Potential risks, benefits, alternative treatments, and expected outcomes have been explained.  All of the patient's questions at this time have been answered.  The likelihood of reaching the  patient's treatment goal is good.  The patient understand the proposed surgical procedure and wishes to proceed.  We will obtain cardiac clearance from her cardiologist prior to scheduling surgery.  I spent approximately 55 minutes with this patient as well as  discussing her care with other specialists.  Natally Ribera Jearld Adjutant, MD  11/02/2021 11:19 AM

## 2021-11-03 ENCOUNTER — Encounter: Payer: Self-pay | Admitting: Genetic Counselor

## 2021-11-03 ENCOUNTER — Ambulatory Visit (INDEPENDENT_AMBULATORY_CARE_PROVIDER_SITE_OTHER): Payer: Medicare Other | Admitting: Family Medicine

## 2021-11-03 DIAGNOSIS — Z803 Family history of malignant neoplasm of breast: Secondary | ICD-10-CM | POA: Insufficient documentation

## 2021-11-03 DIAGNOSIS — Z800 Family history of malignant neoplasm of digestive organs: Secondary | ICD-10-CM | POA: Insufficient documentation

## 2021-11-03 NOTE — Progress Notes (Signed)
REFERRING PROVIDER: [name] Pike, MD [place],  [place] [id number]  PRIMARY PROVIDER:  Sonia Side., FNP  PRIMARY REASON FOR VISIT:  1. Malignant neoplasm of upper-outer quadrant of right breast in female, estrogen receptor negative ([place])   2. Family history of breast cancer   3. Family history of colon cancer     HISTORY OF PRESENT ILLNESS:   Ms. Tiller, a 61 y.o. female, was seen for a [place] cancer genetics consultation during the breast multidisciplinary clinic at the request of Dr. Chryl Heck due to a personal history of breast cancer.  Ms. Stabler presents to clinic today to discuss the possibility of a hereditary predisposition to cancer, to discuss genetic testing, and to further clarify her future cancer risks, as well as potential cancer risks for family members.   In July 2023, at the age of 56, Ms. 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 The treatment plan is pending.   CANCER HISTORY:  Oncology History  Malignant neoplasm of upper-outer quadrant of female breast ([place])  10/07/2021 Mammogram   Right breast, possible mass warrants further evaluation, no findings suspicious for malignancy in the left breast.Targeted ultrasound is performed, showing a 8 x 6 x 7 mm irregular mass at the right breast 1 o'clock 13 cm from nipple correlating to the mammographic mass. Ultrasound of the right axilla is negative.   10/27/2021 Pathology Results   Per verbal report at breast [facility] today, grade 3 IDC, triple negative, Ki 67 25%   11/02/2021 Initial Diagnosis   Malignant neoplasm of upper-outer quadrant of female breast ([place])      RISK FACTORS:  Colonoscopy: yes;  most recent in 05/2021 . Hysterectomy: yes.  Menarche was at age 14.  First live birth at age 81.  OCP use for unknown number of years.  HRT use: 0 years.   Past Medical History:  Diagnosis Date   Allergy    Anginal pain ([place])    a. NL cath in  2008;  b. Myoview 03/2011: dec uptake along mid anterior wall on stress imaging -> ? attenuation vs. ischemia, EF 65%;  c. 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 2008 -> coumadin x 6 mos.   Rheumatoid arthritis ([place])    Sleep apnea    on CPAP 02/2018   SOB (shortness of breath)    Thyroid disease    TIA (transient ischemic attack)  Urinary urgency    Vitamin D deficiency     Past Surgical History:  Procedure Laterality Date   ABDOMINAL HYSTERECTOMY  2005   APPENDECTOMY     CARDIAC CATHETERIZATION     Normal   CARDIAC CATHETERIZATION N/A 09/30/2014   Procedure: Left Heart Cath and Coronary Angiography;  Surgeon: Sherren Mocha, MD;  Location: [facility];  Service: Cardiovascular;  Laterality: N/A;   LAPAROSCOPIC APPENDECTOMY N/A 06/03/2012   Procedure: APPENDECTOMY LAPAROSCOPIC;  Surgeon: Stark Klein, MD;  Location: [facility];  Service: General;  Laterality: N/A;   Left knee surgery  2008   LEG SURGERY     TONSILLECTOMY     TOTAL HIP ARTHROPLASTY Left 06/11/2018   Procedure: LEFT TOTAL HIP ARTHROPLASTY ANTERIOR APPROACH;  Surgeon: Meredith Pel, MD;  Location: [facility];  Service: Orthopedics;  Laterality: Left;   TOTAL KNEE ARTHROPLASTY Left 08/22/2016   Procedure: TOTAL KNEE  ARTHROPLASTY;  Surgeon: Meredith Pel, MD;  Location: [facility];  Service: Orthopedics;  Laterality: Left;   TUBAL LIGATION  1989     FAMILY HISTORY:  We obtained a detailed, 4-generation family history.  Significant diagnoses are listed below: Family History  Problem Relation Age of Onset   Breast cancer Maternal Aunt 48 - 40   Colon cancer Maternal Grandfather        dx after 71   Breast cancer Cousin        dx 55s     Ms. Ranes is unaware of previous family history of genetic testing for hereditary cancer risks. There is no reported Ashkenazi Jewish ancestry. There is no known consanguinity.  GENETIC COUNSELING ASSESSMENT: Ms. Dedic is a 61 y.o. female with a personal and family history of cancer which is somewhat suggestive of a hereditary cancer syndrome and predisposition to cancer given the triple negative status of her breast cancer and the presence of breast cancers at young ages in multiple generations of her maternal family. We, therefore, discussed and recommended the following at today's visit.   DISCUSSION: We discussed that 5 - 10% of cancer is hereditary, with most cases of hereditary breast cancer associated with mutations in BRCA1/2.  There are other genes that can be associated with hereditary breast cancer syndromes.  Type of cancer risk and level of risk are gene-specific. We discussed that testing is beneficial for several reasons including knowing how to follow individuals after completing their treatment, identifying whether potential treatment options would be beneficial, such as PARP inhibitors, and understanding if other family members could be at risk for cancer and allowing them to undergo genetic testing.   We reviewed the characteristics, features and inheritance patterns of hereditary cancer syndromes. We also discussed genetic testing, including the appropriate family members to test, the process of testing, insurance coverage and turn-around-time  for results. We discussed the implications of a negative, positive and/or variant of uncertain significant result. In order to get genetic test results in a timely manner so that Ms. Fuston can use these genetic test results for surgical decisions, we recommended Ms. Hacking pursue genetic testing for the [employer].  The BRCAplus panel offered by [employer] and includes sequencing and deletion/duplication analysis for the following 8 genes: ATM, BRCA1, BRCA2, CDH1, CHEK2, PALB2, PTEN, and TP53. Once complete, we recommend Ms. Ryall pursue reflex genetic testing to a more comprehensive gene panel.   The CustomNext-Cancer+RNAinsight panel offered by [facility] includes sequencing and rearrangement analysis for the following 47 genes:  APC,  ATM, AXIN2, BARD1, BMPR1A, BRCA1, BRCA2, BRIP1, CDH1, CDK4, CDKN2A, CHEK2, DICER1, EPCAM, GREM1, HOXB13, MEN1, MLH1, MSH2, MSH3, MSH6, MUTYH, NBN, NF1, NF2, NTHL1, PALB2, PMS2, POLD1, POLE, PTEN, RAD51C, RAD51D, RECQL, RET, SDHA, SDHAF2, SDHB, SDHC, SDHD, SMAD4, SMARCA4, STK11, TP53, TSC1, TSC2, and VHL.  RNA data is routinely analyzed for use in variant interpretation for all genes.  Based on Ms. Strickland's personal history of triple negative breast cancer, she meets medical criteria for genetic testing. Despite that she meets criteria, she may still have an out of pocket cost. We discussed that if her out of pocket cost for testing is over $100, the laboratory should contact them to discuss self-pay prices, patient pay assistance programs, if applicable, and other billing options.   PLAN: After considering the risks, benefits, and limitations, Ms. Hulick provided informed consent to pursue genetic testing and the blood sample was sent to [facility] for analysis of the [facility] and CustomNext-Cancer +RNAinsight Panel. Results should be available within approximately 1-2 weeks' time, at which point they will be disclosed by  telephone to Ms. Agostinelli, as will any additional recommendations warranted by these results. Ms. Bratton will receive a summary of her genetic counseling visit and a copy of her results once available. This information will also be available in Epic.   Ms. Duch questions were answered to her satisfaction today. Our contact information was provided should additional questions or concerns arise. Thank you for the referral and allowing [name] to share in the care of your patient.   Addilyne Backs M. Joette Catching, [place], [facility] Genetic Counselor [email or web address] (P) [phone]  The patient was seen for a total of 15 minutes in face-to-face genetic counseling.  Patient was accompanied by her daughter, Charlena Cross.  Drs. Magrinat, Lindi Adie and/or Burr Medico were available to discuss this case as needed.  _______________________________________________________________________ For Office Staff:  Number of people involved in session: 2 Was an Intern/ student involved with case: no

## 2021-11-03 NOTE — Telephone Encounter (Signed)
   Name: [name]  DOB: [birth year]  MRN: [id number]   Primary Cardiologist: [name], MD  Chart reviewed as part of pre-operative protocol coverage.   [name] was last seen on 09/30/21 by Dr. Johnsie Cancel.  She was doing well at that time.  Pharmacologic clearance was requested for right breast lumpectomy.  Per our office antiplatelet protocol, okay to hold ASA 81 mg x 7 days prior to the procedure.  Please restart when medically safe to do so.  I will route this recommendation to the requesting party via Epic fax function and remove from pre-op pool. Please call with questions.  Elgie Collard, PA-C 11/03/2021, 8:19 AM

## 2021-11-06 ENCOUNTER — Emergency Department
Admission: EM | Admit: 2021-11-06 | Discharge: 2021-11-06 | Disposition: A | Discharge status: Home / Self Care | Payer: Medicare Other | Attending: Emergency Medicine | Admitting: Emergency Medicine

## 2021-11-06 DIAGNOSIS — T7840XA Allergy, unspecified, initial encounter: Secondary | ICD-10-CM

## 2021-11-06 MED ORDER — FAMOTIDINE IN NACL 20-0.9 MG/50ML-% IV SOLN
20.0000 mg | Freq: Once | INTRAVENOUS | Status: AC
  Administered 2021-11-06: 20 mg via INTRAVENOUS

## 2021-11-06 MED ORDER — DIPHENHYDRAMINE HCL 50 MG/ML IJ SOLN
50.0000 mg | Freq: Once | INTRAMUSCULAR | Status: AC
  Administered 2021-11-06: 50 mg via INTRAVENOUS

## 2021-11-06 MED ORDER — PREDNISONE 20 MG PO TABS: 40.0000 mg | ORAL_TABLET | Freq: Every day | ORAL | 0 refills | Status: AC

## 2021-11-06 MED ORDER — METHYLPREDNISOLONE SODIUM SUCC 125 MG IJ SOLR
125.0000 mg | Freq: Once | INTRAMUSCULAR | Status: AC
  Administered 2021-11-06: 125 mg via INTRAVENOUS

## 2021-11-06 MED ORDER — EPINEPHRINE 0.3 MG/0.3ML IJ SOAJ: 0.3000 mg | INTRAMUSCULAR | 3 refills | Status: AC | PRN

## 2021-11-06 NOTE — ED Triage Notes (Signed)
Pt arrives with hives noted to feet redness noted to feet and face, clearing throat, sensation of "throat closing up". Pt states symptoms began after eating seafood approx one hour pta. Pt states "my whole body is itching". Pt is continously clearing throat in triage, no resp distress noted. No obvious angioedema noted. Pt states her throat and mouth itch as well.

## 2021-11-06 NOTE — ED Notes (Signed)
Pt states that at this time, she does feel [facility] but that it is getting better.

## 2021-11-06 NOTE — ED Notes (Signed)
Pt states that itching has resolved at this time and [facility] continues to improve. Family at bedside at this time.

## 2021-11-06 NOTE — ED Provider Notes (Signed)
[facility] Provider Note    Event Date/Time   First MD Initiated Contact with Patient 11/06/21 2005     (approximate)   History   Chief Complaint: Allergic reaction  HPI  [name] is a 61 y.o. female who complains of a feeling of throat swelling and redness and itching of both feet ever since eating dinner at a restaurant which consisted of seafood.  Denies any known food allergies.  Denies shortness of breath or wheezing.  No dizziness or syncope.  No change in medications     Physical Exam   Triage Vital Signs: ED Triage Vitals  Enc Vitals Group     BP 11/06/21 1928 (!) 151/76     Pulse Rate 11/06/21 1928 69     Resp 11/06/21 1928 17     Temp 11/06/21 1928 98.6 F (37 C)     Temp src --      SpO2 11/06/21 1928 100 %     Weight 11/06/21 1936 193 lb (87.5 kg)     Height 11/06/21 1936 '4\' 11"'$  (1.499 m)     Head Circumference --      Peak Flow --      Pain Score 11/06/21 1947 0     Pain Loc --      Pain Edu? --      Excl. in [place]? --     Most recent vital signs: Vitals:   11/06/21 2052 11/06/21 2100  BP: (!) 151/82 (!) 147/78  Pulse: 78 74  Resp: 14 18  Temp:    SpO2: 96% 96%    General: Awake, no distress.  CV:  Good peripheral perfusion.  Regular rate rhythm Resp:  Normal effort.  Clear to auscultation bilaterally.  No stridor or wheezing or crackles Abd:  No distention.  Soft nontender Other:  Mild urticarial rash on distal extremities   ED Results / Procedures / Treatments   Labs (all labs ordered are listed, but only abnormal results are displayed) Labs Reviewed - No data to display   EKG    RADIOLOGY    PROCEDURES:  Procedures   MEDICATIONS ORDERED IN ED: Medications  diphenhydrAMINE (BENADRYL) injection 50 mg (50 mg Intravenous Given 11/06/21 1930)  methylPREDNISolone sodium succinate (SOLU-MEDROL) 125 mg/2 mL injection 125 mg (125 mg Intravenous Given 11/06/21 1930)  famotidine (PEPCID) IVPB 20 mg  premix (0 mg Intravenous Stopped 11/06/21 2051)     IMPRESSION / MDM / ASSESSMENT AND PLAN / ED COURSE  I reviewed the triage vital signs and the nursing notes.                                Patient's presentation is most consistent with acute presentation with potential threat to life or bodily function.  Patient presents with allergic reaction, not meeting criteria for anaphylaxis.  She was given Solu-Medrol Pepcid and Benadryl and feeling better.  On reassessment, urticarial rash has resolved.  Still no tongue swelling or floor mouth edema, oropharynx is normal, lungs are clear to auscultation bilaterally.  Stable for discharge.  Recommended keeping a food log including what she ate tonight, prednisone for a few more days, EpiPen at home, follow-up with primary care.       FINAL CLINICAL IMPRESSION(S) / ED DIAGNOSES   Final diagnoses:  Allergic reaction, initial encounter     Rx / DC Orders   ED Discharge Orders  Ordered    EPINEPHrine 0.3 mg/0.3 mL IJ SOAJ injection  As needed        11/06/21 2020    predniSONE (DELTASONE) 20 MG tablet  Daily with breakfast        11/06/21 2020             Note:  This document was prepared using Dragon voice recognition software and may include unintentional dictation errors.   Carrie Mew, MD 11/06/21 2210

## 2021-11-06 NOTE — ED Notes (Addendum)
Pt denies shortness of breath and states itching has improved but still present in bilateral feet, toes, arms, chest. Swelling is resolving as well though still present.

## 2021-11-07 ENCOUNTER — Other Ambulatory Visit: Payer: Self-pay | Admitting: Surgery

## 2021-11-07 ENCOUNTER — Telehealth: Payer: Self-pay

## 2021-11-07 DIAGNOSIS — C50411 Malignant neoplasm of upper-outer quadrant of right female breast: Secondary | ICD-10-CM

## 2021-11-07 NOTE — Telephone Encounter (Signed)
[employer] 2021-05 - Specimen Collection Study to Evaluate Biomarkers in Subjects with Cancer    Called to follow-up on interest in above-listed study. Patient stated that at this time, she felt like she had too much going on to participate. MD alerted. Thanked patient for her consideration and encouraged her to call back with any needs.  Vickii Penna, RN, BSN, CPN Clinical Research Nurse I [phone]  11/07/2021 10:56 AM

## 2021-11-09 ENCOUNTER — Encounter (HOSPITAL_BASED_OUTPATIENT_CLINIC_OR_DEPARTMENT_OTHER): Payer: Self-pay | Admitting: Surgery

## 2021-11-09 ENCOUNTER — Other Ambulatory Visit: Payer: Self-pay

## 2021-11-10 ENCOUNTER — Telehealth: Payer: Self-pay | Admitting: *Deleted

## 2021-11-10 ENCOUNTER — Encounter: Payer: Self-pay | Admitting: *Deleted

## 2021-11-10 NOTE — Therapy (Signed)
OUTPATIENT PHYSICAL THERAPY BREAST CANCER BASELINE EVALUATION   Patient Name: [name] MRN: [id number] DOB:November 14, 1960, 61 y.o., female Today's Date: 11/11/2021   PT End of Session - 11/11/21 0932     Visit Number 1    Number of Visits 2    Date for PT Re-Evaluation 12/14/21    PT Start Time 0900    PT Stop Time 0930    PT Time Calculation (min) 30 min    Activity Tolerance Patient tolerated treatment well    Behavior During Therapy [facility] for tasks assessed/performed             Past Medical History:  Diagnosis Date   Allergy    Anginal pain ([place])    a. NL cath in 2008;  b. Myoview 03/2011: dec uptake along mid anterior wall on stress imaging -> ? attenuation vs. ischemia, EF 65%;  c. Echo 04/2011: EF 55-60%, no RWMA, Gr 2 dd   Anxiety    Arthritis    Asthma    Back pain    Bone cancer (HCC)    Cancer ([place])    Chest pain    CHF (congestive heart failure) (HCC)    Clotting disorder (HCC)    Constipation    Depression    Diabetes mellitus    Drug use    Dyspnea    Frequent urination    GERD (gastroesophageal reflux disease)    Glaucoma    History of stomach ulcers    HLD (hyperlipidemia)    Hypertension    IBS (irritable bowel syndrome)    Joint pain    Joint pain    Lactose intolerance    Leg edema    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 benign thymoma   Neuromuscular disorder ([place])    Obesity    Palpitations    Pneumonia 05/2016   double   Pulmonary edema    Pulmonary embolism ([place])    a. 2008 -> coumadin x 6 mos.   Rheumatoid arthritis ([place])    Sleep apnea    on CPAP 02/2018   SOB (shortness of breath)    Thyroid disease    TIA (transient ischemic attack)    Urinary urgency    Vitamin D deficiency    Past Surgical History:  Procedure Laterality Date   ABDOMINAL HYSTERECTOMY  2005   APPENDECTOMY     CARDIAC CATHETERIZATION     Normal   CARDIAC CATHETERIZATION N/A 09/30/2014   Procedure: Left Heart Cath and Coronary Angiography;   Surgeon: Sherren Mocha, MD;  Location: [facility];  Service: Cardiovascular;  Laterality: N/A;   LAPAROSCOPIC APPENDECTOMY N/A 06/03/2012   Procedure: APPENDECTOMY LAPAROSCOPIC;  Surgeon: Stark Klein, MD;  Location: [facility];  Service: General;  Laterality: N/A;   Left knee surgery  2008   LEG SURGERY     TONSILLECTOMY     TOTAL HIP ARTHROPLASTY Left 06/11/2018   Procedure: LEFT TOTAL HIP ARTHROPLASTY ANTERIOR APPROACH;  Surgeon: Meredith Pel, MD;  Location: [facility];  Service: Orthopedics;  Laterality: Left;   TOTAL KNEE ARTHROPLASTY Left 08/22/2016   Procedure: TOTAL KNEE ARTHROPLASTY;  Surgeon: Meredith Pel, MD;  Location: [facility];  Service: Orthopedics;  Laterality: Left;   TUBAL LIGATION  1989   Patient Active Problem List   Diagnosis Date Noted   Family history of breast cancer 11/03/2021   Family history of colon cancer 11/03/2021   Malignant neoplasm of upper-outer quadrant of female  breast ([place]) 11/02/2021   Back pain 06/01/2021   Right thyroid nodule 05/31/2021   At risk for impaired metabolic function 42/35/3614   Unilateral primary osteoarthritis, left hip    Hip arthritis 06/11/2018   Class 3 severe obesity with serious comorbidity and body mass index (BMI) of 40.0 to 44.9 in adult [facility]) 05/29/2018   Other fatigue 11/20/2017   Shortness of breath on exertion 11/20/2017   Diabetes mellitus ([place]) 11/20/2017   Vitamin D deficiency 11/20/2017   Depression 11/20/2017   Other hyperlipidemia 11/20/2017   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/04/2016   Presence of left artificial knee joint 09/20/2016   Arthritis of knee 08/22/2016   Cervical radiculopathy 07/26/2016   Cervical disc disorder with radiculopathy 07/26/2016   Right arm pain 06/29/2016   Cervicalgia 06/29/2016   Primary osteoarthritis of left knee 06/29/2016   Hypersomnia with sleep apnea 11/18/2015   Lethargy 11/18/2015   Exertional chest pain 09/30/2014   Abnormal cardiac function test 09/29/2014    Chest pain with moderate risk for cardiac etiology 09/28/2014   HTN (hypertension) 09/28/2014   Hypokalemia    Paresthesia 06/18/2014   Numbness and tingling of left arm and leg    Unstable angina ([place]) 05/24/2014   OSA on CPAP 05/24/2014   CAD (coronary artery disease) 11/25/2012   S/P laparoscopic appendectomy 43/15/4008   Diastolic CHF ([place]) 67/61/9509   Mediastinal mass 01/09/2012   SOB (shortness of breath) 06/20/2011   Mediastinal abnormality 06/20/2011   Dyslipidemia 12/23/2009   GLAUCOMA 12/23/2009   ARTHRITIS 12/23/2009   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 FNP  REFERRING PROVIDER: Dr. Georgette Dover  REFERRING DIAG: Rt breast cancer  THERAPY DIAG:  Malignant neoplasm of upper-outer quadrant of right breast in female, estrogen receptor negative ([name])  Abnormal posture  Rationale for Evaluation and Treatment Rehabilitation  ONSET DATE: 10/28/21  SUBJECTIVE                                                                                                                                                                                           SUBJECTIVE STATEMENT: Patient reports she is here today to be seen by her medical team for her newly diagnosed right  breast cancer.  Reports she just started therapy for her Rt elbow after hurting it in a fall.   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 Other hx includes DM, CHF, hx of stroke,  anxiety  PATIENT GOALS   reduce lymphedema risk and learn post op HEP.   PAIN:  Are you having pain? Yes elbow pain - I just started therapy for this around 3-4/10. It gets worse when I move it   PRECAUTIONS: Active CA   HAND DOMINANCE: right  WEIGHT BEARING RESTRICTIONS No - was recently in a boot for a fall   FALLS:  Has patient fallen in last 6 months? Yes. Number of falls 1  LIVING ENVIRONMENT: Patient lives with: alone Lives in: House/apartment Has following equipment at home: every now and then I use a cane for my back issues  OCCUPATION: retired   LEISURE: I would like to do more swimming   PRIOR LEVEL OF FUNCTION: Independent   OBJECTIVE COGNITION:  Overall cognitive status: Within functional limits for tasks assessed    POSTURE:  Forward head and rounded shoulders posture  UPPER EXTREMITY AROM/PROM:  A/PROM RIGHT   eval   Shoulder extension 48  Shoulder flexion 132pn  Shoulder abduction 135pn  Shoulder internal rotation   Shoulder external rotation 78pn in elbow     (Blank rows = not tested)  A/PROM LEFT   eval  Shoulder extension 55  Shoulder flexion 153  Shoulder abduction 145  Shoulder internal rotation   Shoulder external rotation 85    (Blank rows = not tested)   CERVICAL AROM: All within normal limits:   LYMPHEDEMA ASSESSMENTS:   LANDMARK RIGHT   eval  10 cm proximal to olecranon process 36  Olecranon process 29  10 cm proximal to ulnar styloid process 23.5  Just proximal to ulnar styloid process 17.5  Across hand at thumb web space 20.0  At base of 2nd digit 7.3  (Blank rows = not tested)  LANDMARK LEFT   eval  10 cm proximal to olecranon process 35.2  Olecranon process 30.5  10 cm proximal to ulnar styloid process 23.5  Just proximal to ulnar styloid process 17  Across hand at thumb web space 20.3  At base of 2nd digit 6.9  (Blank rows = not tested)   L-DEX LYMPHEDEMA SCREENING: The patient was assessed using the  L-Dex machine today to produce a lymphedema index baseline score. The patient will be reassessed on a regular basis (typically every 3 months) to obtain new L-Dex scores. If the score is > 6.5 points away from his/her baseline score indicating onset of subclinical lymphedema, it will be recommended to wear a compression garment for 4 weeks, 12 hours per day and then be reassessed. If the score continues to be > 6.5 points from baseline at reassessment, we will initiate lymphedema treatment. Assessing in this manner has a 95% rate of preventing clinically significant lymphedema.   QUICK DASH SURVEY: 27%  PATIENT EDUCATION:  Education details: Lymphedema risk reduction and post op shoulder/posture HEP Person educated: Patient Education method: Explanation, Demonstration, Handout Education comprehension: Patient verbalized understanding and returned demonstration   HOME EXERCISE PROGRAM: Patient was instructed today in a home exercise program today for post op shoulder range of motion. These included active assist shoulder flexion in sitting, scapular retraction, wall walking with shoulder abduction, and hands behind head external rotation.  She  was encouraged to do these twice a day, holding 3 seconds and repeating 5 times when permitted by her physician.   ASSESSMENT:  CLINICAL IMPRESSION: Pt will be having a Rt lumpectomy with SLNB next week.  She already has some decreased shoulder AROM and pain due to a recent fall.  She did start therapy for her elbow.  She was encouraged to start her AAROM exercises prior to surgery.  She will benefit from a post op PT reassessment to determine needs and from L-Dex screens every 3 months for 2 years to detect subclinical lymphedema.  Pt will benefit from skilled therapeutic intervention to improve on the following deficits: Decreased knowledge of precautions, impaired UE functional use, pain, decreased ROM, postural dysfunction.   PT  treatment/interventions: ADL/self-care home management, pt/family education, therapeutic exercise  REHAB POTENTIAL: Excellent  CLINICAL DECISION MAKING: Stable/uncomplicated  EVALUATION COMPLEXITY: Low   GOALS: Goals reviewed with patient? YES  LONG TERM GOALS: (STG=LTG)    Name Target Date Goal status  1 Pt will be able to verbalize understanding of pertinent lymphedema risk reduction practices relevant to her dx specifically related to skin care.  Baseline:  No knowledge 11/11/2021 Achieved at eval  2 Pt will be able to return demo and/or verbalize understanding of the post op HEP related to regaining shoulder ROM. Baseline:  No knowledge 11/11/2021 Achieved at eval  3 Pt will be able to verbalize understanding of the importance of attending the post op After Breast CA Class for further lymphedema risk reduction education and therapeutic exercise.  Baseline:  No knowledge 11/11/2021 Achieved at eval  4 Pt will demo she has regained full shoulder ROM and function post operatively compared to baselines.  Baseline: See objective measurements taken today. 12/14/21      PLAN: PT FREQUENCY/DURATION: EVAL and 1 follow up appointment.   PLAN FOR NEXT SESSION: will reassess 3-4 weeks post op to determine needs.   Patient will follow up at outpatient cancer rehab 3-4 weeks following surgery.  If the patient requires physical therapy at that time, a specific plan will be dictated and sent to the referring physician for approval. The patient was educated today on appropriate basic range of motion exercises to begin post operatively and the importance of attending the After Breast Cancer class following surgery.  Patient was educated today on lymphedema risk reduction practices as it pertains to recommendations that will benefit the patient immediately following surgery.  She verbalized good understanding.    Physical Therapy Information for After Breast Cancer Surgery/Treatment:  Lymphedema is  a swelling condition that you may be at risk for in your arm if you have lymph nodes removed from the armpit area.  After a sentinel node biopsy, the risk is approximately 5-9% and is higher after an axillary node dissection.  There is treatment available for this condition and it is not life-threatening.  Contact your physician or physical therapist with concerns. You may begin the 4 shoulder/posture exercises (see additional sheet) when permitted by your physician (typically a week after surgery).  If you have drains, you may need to wait until those are removed before beginning range of motion exercises.  A general recommendation is to not lift your arms above shoulder height until drains are removed.  These exercises should be done to your tolerance and gently.  This is not a "no pain/no gain" type of recovery so listen to your body and stretch into the range of motion that you can tolerate, stopping if you  have pain.  If you are having immediate reconstruction, ask your plastic surgeon about doing exercises as he or she may want you to wait. We encourage you to attend the free one time ABC (After Breast Cancer) class offered by [facility].  You will learn information related to lymphedema risk, prevention and treatment and additional exercises to regain mobility following surgery.  You can call [phone] for more information.  This is offered the 1st and 3rd Monday of each month.  You only attend the class one time. While undergoing any medical procedure or treatment, try to avoid blood pressure being taken or needle sticks from occurring on the arm on the side of cancer.   This recommendation begins after surgery and continues for the rest of your life.  This may help reduce your risk of getting lymphedema (swelling in your arm). An excellent resource for those seeking information on lymphedema is the National Lymphedema Network's web site. It can be accessed at  [place].org If you notice swelling in your hand, arm or breast at any time following surgery (even if it is many years from now), please contact your doctor or physical therapist to discuss this.  Lymphedema can be treated at any time but it is easier for you if it is treated early on.  If you feel like your shoulder motion is not returning to normal in a reasonable amount of time, please contact your surgeon or physical therapist.  [facility] [phone]. [address], [address], [place]  [address]  ABC CLASS After Breast Cancer Class  After Breast Cancer Class is a specially designed exercise class to assist you in a safe recover after having breast cancer surgery.  In this class you will learn how to get back to full function whether your drains were just removed or if you had surgery a month ago.  This one-time class is held the 1st and 3rd Monday of every month from 11:00 a.m. until 12:00 noon virtually.  This class is FREE and space is limited. For more information or to register for the next available class, call [phone].  Class Goals  Understand specific stretches to improve the flexibility of you chest and shoulder. Learn ways to safely strengthen your upper body and improve your posture. Understand the warning signs of infection and why you may be at risk for an arm infection. Learn about Lymphedema and prevention.  ** You do not attend this class until after surgery.  Drains must be removed to participate  Patient was instructed today in a home exercise program today for post op shoulder range of motion. These included active assist shoulder flexion in sitting, scapular retraction, wall walking with shoulder abduction, and hands behind head external rotation.  She was encouraged to do these twice a day, holding 3 seconds and repeating 5 times when permitted by her physician.    Stark Bray, PT 11/11/2021, 9:33 AM

## 2021-11-10 NOTE — Telephone Encounter (Signed)
Spoke with patient to follow up from [facility] 7/12 and assess navigation needs. Patient denies any questions at this time. She just feels a little overwhelmed with everything going on but she is aware of all her appts. Encouraged her to call should she have any questions or concerns. Patient verbalized understanding.

## 2021-11-11 ENCOUNTER — Encounter: Payer: Self-pay | Admitting: Rehabilitation

## 2021-11-11 ENCOUNTER — Telehealth: Payer: Self-pay | Admitting: Hematology and Oncology

## 2021-11-11 ENCOUNTER — Ambulatory Visit: Payer: Medicare Other | Attending: Surgery | Admitting: Rehabilitation

## 2021-11-11 ENCOUNTER — Other Ambulatory Visit: Payer: Self-pay

## 2021-11-11 DIAGNOSIS — C50411 Malignant neoplasm of upper-outer quadrant of right female breast: Secondary | ICD-10-CM | POA: Diagnosis not present

## 2021-11-11 DIAGNOSIS — R293 Abnormal posture: Secondary | ICD-10-CM | POA: Diagnosis not present

## 2021-11-11 DIAGNOSIS — Z171 Estrogen receptor negative status [ER-]: Secondary | ICD-10-CM | POA: Diagnosis not present

## 2021-11-11 NOTE — Telephone Encounter (Signed)
.  Called patient to schedule appointment per 7/14 inbasket, patient is aware of date and time.

## 2021-11-15 ENCOUNTER — Ambulatory Visit
Admission: RE | Admit: 2021-11-15 | Discharge: 2021-11-15 | Disposition: A | Discharge status: Home / Self Care | Payer: Medicare Other | Source: Ambulatory Visit | Attending: Surgery | Admitting: Surgery

## 2021-11-15 ENCOUNTER — Telehealth: Payer: Self-pay | Admitting: Genetic Counselor

## 2021-11-15 DIAGNOSIS — Z171 Estrogen receptor negative status [ER-]: Secondary | ICD-10-CM

## 2021-11-15 NOTE — Telephone Encounter (Signed)
Notified [name] that genetics results are delayed and will not likely be back prior to surgery tomorrow.  Patient wished to proceed with surgery as planned without having results back.  Will call patient when results are available.

## 2021-11-15 NOTE — Progress Notes (Signed)

## 2021-11-15 NOTE — Progress Notes (Signed)
Sent text reminding pt to come pick up pre surgery drink and a soap.

## 2021-11-16 ENCOUNTER — Encounter (HOSPITAL_BASED_OUTPATIENT_CLINIC_OR_DEPARTMENT_OTHER)
Admission: RE | Disposition: A | Discharge status: Home / Self Care | Payer: Self-pay | Source: Home / Self Care | Attending: Surgery

## 2021-11-16 ENCOUNTER — Ambulatory Visit
Admission: RE | Admit: 2021-11-16 | Discharge: 2021-11-16 | Disposition: A | Discharge status: Home / Self Care | Payer: Medicare Other | Source: Ambulatory Visit | Attending: Surgery | Admitting: Surgery

## 2021-11-16 ENCOUNTER — Ambulatory Visit (HOSPITAL_BASED_OUTPATIENT_CLINIC_OR_DEPARTMENT_OTHER)
Admission: RE | Admit: 2021-11-16 | Discharge: 2021-11-16 | Disposition: A | Discharge status: Home / Self Care | Payer: Medicare Other | Attending: Surgery | Admitting: Surgery

## 2021-11-16 ENCOUNTER — Other Ambulatory Visit: Payer: Self-pay

## 2021-11-16 ENCOUNTER — Ambulatory Visit (HOSPITAL_BASED_OUTPATIENT_CLINIC_OR_DEPARTMENT_OTHER): Payer: Medicare Other | Admitting: Certified Registered"

## 2021-11-16 ENCOUNTER — Ambulatory Visit (HOSPITAL_COMMUNITY)
Admission: RE | Admit: 2021-11-16 | Discharge: 2021-11-16 | Disposition: A | Discharge status: Home / Self Care | Payer: Medicare Other | Source: Ambulatory Visit | Attending: Surgery | Admitting: Surgery

## 2021-11-16 ENCOUNTER — Encounter (HOSPITAL_BASED_OUTPATIENT_CLINIC_OR_DEPARTMENT_OTHER): Payer: Self-pay | Admitting: Surgery

## 2021-11-16 DIAGNOSIS — I509 Heart failure, unspecified: Secondary | ICD-10-CM | POA: Diagnosis not present

## 2021-11-16 DIAGNOSIS — Z171 Estrogen receptor negative status [ER-]: Secondary | ICD-10-CM | POA: Insufficient documentation

## 2021-11-16 DIAGNOSIS — C50411 Malignant neoplasm of upper-outer quadrant of right female breast: Secondary | ICD-10-CM | POA: Diagnosis present

## 2021-11-16 DIAGNOSIS — I5032 Chronic diastolic (congestive) heart failure: Secondary | ICD-10-CM | POA: Diagnosis not present

## 2021-11-16 DIAGNOSIS — Z7984 Long term (current) use of oral hypoglycemic drugs: Secondary | ICD-10-CM | POA: Insufficient documentation

## 2021-11-16 DIAGNOSIS — I25119 Atherosclerotic heart disease of native coronary artery with unspecified angina pectoris: Secondary | ICD-10-CM | POA: Diagnosis not present

## 2021-11-16 DIAGNOSIS — E119 Type 2 diabetes mellitus without complications: Secondary | ICD-10-CM | POA: Diagnosis not present

## 2021-11-16 DIAGNOSIS — I272 Pulmonary hypertension, unspecified: Secondary | ICD-10-CM | POA: Diagnosis not present

## 2021-11-16 DIAGNOSIS — F419 Anxiety disorder, unspecified: Secondary | ICD-10-CM | POA: Insufficient documentation

## 2021-11-16 DIAGNOSIS — Z01818 Encounter for other preprocedural examination: Secondary | ICD-10-CM

## 2021-11-16 DIAGNOSIS — Z8673 Personal history of transient ischemic attack (TIA), and cerebral infarction without residual deficits: Secondary | ICD-10-CM | POA: Insufficient documentation

## 2021-11-16 DIAGNOSIS — I110 Hypertensive heart disease with heart failure: Secondary | ICD-10-CM

## 2021-11-16 DIAGNOSIS — Z803 Family history of malignant neoplasm of breast: Secondary | ICD-10-CM | POA: Diagnosis not present

## 2021-11-16 DIAGNOSIS — I10 Essential (primary) hypertension: Secondary | ICD-10-CM

## 2021-11-16 DIAGNOSIS — C50211 Malignant neoplasm of upper-inner quadrant of right female breast: Secondary | ICD-10-CM | POA: Diagnosis not present

## 2021-11-16 HISTORY — PX: BREAST LUMPECTOMY WITH RADIOACTIVE SEED AND SENTINEL LYMPH NODE BIOPSY: SHX6550

## 2021-11-16 LAB — GLUCOSE, CAPILLARY
Glucose-Capillary: 122 mg/dL — ABNORMAL HIGH (ref 70–99)
Glucose-Capillary: 142 mg/dL — ABNORMAL HIGH (ref 70–99)

## 2021-11-16 SURGERY — BREAST LUMPECTOMY WITH RADIOACTIVE SEED AND SENTINEL LYMPH NODE BIOPSY
Anesthesia: General | Site: Breast | Laterality: Right

## 2021-11-16 MED ORDER — OXYCODONE HCL 5 MG PO TABS: 5.0000 mg | ORAL_TABLET | Freq: Four times a day (QID) | ORAL | 0 refills | Status: DC | PRN

## 2021-11-16 MED ORDER — DEXAMETHASONE SODIUM PHOSPHATE 4 MG/ML IJ SOLN
INTRAMUSCULAR | Status: DC | PRN
  Administered 2021-11-16: 4 mg via INTRAVENOUS

## 2021-11-16 MED ORDER — MIDAZOLAM HCL 2 MG/2ML IJ SOLN
2.0000 mg | Freq: Once | INTRAMUSCULAR | Status: AC
  Administered 2021-11-16: 2 mg via INTRAVENOUS

## 2021-11-16 MED ORDER — CHLORHEXIDINE GLUCONATE CLOTH 2 % EX PADS: 6.0000 | MEDICATED_PAD | Freq: Once | CUTANEOUS | Status: DC

## 2021-11-16 MED ORDER — LIDOCAINE 2% (20 MG/ML) 5 ML SYRINGE
INTRAMUSCULAR | Status: DC | PRN
  Administered 2021-11-16: 30 mg via INTRAVENOUS

## 2021-11-16 MED ORDER — LIDOCAINE 2% (20 MG/ML) 5 ML SYRINGE
INTRAMUSCULAR | Status: AC
  Filled 2021-11-16: qty 30

## 2021-11-16 MED ORDER — ONDANSETRON HCL 4 MG/2ML IJ SOLN
INTRAMUSCULAR | Status: AC
  Filled 2021-11-16: qty 18

## 2021-11-16 MED ORDER — FENTANYL CITRATE (PF) 100 MCG/2ML IJ SOLN
INTRAMUSCULAR | Status: AC
  Filled 2021-11-16: qty 2

## 2021-11-16 MED ORDER — PROPOFOL 10 MG/ML IV BOLUS
INTRAVENOUS | Status: DC | PRN
  Administered 2021-11-16: 150 mg via INTRAVENOUS

## 2021-11-16 MED ORDER — MIDAZOLAM HCL 2 MG/2ML IJ SOLN
INTRAMUSCULAR | Status: AC
  Filled 2021-11-16: qty 2

## 2021-11-16 MED ORDER — MAGTRACE LYMPHATIC TRACER
INTRAMUSCULAR | Status: DC | PRN
  Administered 2021-11-16: 2 mL via INTRAMUSCULAR

## 2021-11-16 MED ORDER — PROPOFOL 500 MG/50ML IV EMUL
INTRAVENOUS | Status: DC | PRN
  Administered 2021-11-16: 25 ug/kg/min via INTRAVENOUS

## 2021-11-16 MED ORDER — TECHNETIUM TC 99M TILMANOCEPT KIT
1.0000 | PACK | Freq: Once | INTRAVENOUS | Status: AC | PRN
  Administered 2021-11-16: 1 via INTRADERMAL

## 2021-11-16 MED ORDER — FENTANYL CITRATE (PF) 100 MCG/2ML IJ SOLN
50.0000 ug | Freq: Once | INTRAMUSCULAR | Status: AC
  Administered 2021-11-16: 50 ug via INTRAVENOUS

## 2021-11-16 MED ORDER — ACETAMINOPHEN 500 MG PO TABS
ORAL_TABLET | ORAL | Status: AC
Start: 2021-11-16 — End: ?
  Filled 2021-11-16: qty 2

## 2021-11-16 MED ORDER — CEFAZOLIN SODIUM-DEXTROSE 2-4 GM/100ML-% IV SOLN
INTRAVENOUS | Status: AC
Start: 2021-11-16 — End: ?
  Filled 2021-11-16: qty 100

## 2021-11-16 MED ORDER — CEFAZOLIN SODIUM-DEXTROSE 2-4 GM/100ML-% IV SOLN
2.0000 g | INTRAVENOUS | Status: AC
  Administered 2021-11-16: 2 g via INTRAVENOUS

## 2021-11-16 MED ORDER — PHENYLEPHRINE 80 MCG/ML (10ML) SYRINGE FOR IV PUSH (FOR BLOOD PRESSURE SUPPORT)
PREFILLED_SYRINGE | INTRAVENOUS | Status: AC
  Filled 2021-11-16: qty 30

## 2021-11-16 MED ORDER — ACETAMINOPHEN 500 MG PO TABS
1000.0000 mg | ORAL_TABLET | ORAL | Status: AC
  Administered 2021-11-16: 1000 mg via ORAL

## 2021-11-16 MED ORDER — EPHEDRINE 5 MG/ML INJ
INTRAVENOUS | Status: AC
  Filled 2021-11-16: qty 15

## 2021-11-16 MED ORDER — ONDANSETRON HCL 4 MG/2ML IJ SOLN
INTRAMUSCULAR | Status: DC | PRN
  Administered 2021-11-16: 4 mg via INTRAVENOUS

## 2021-11-16 MED ORDER — BUPIVACAINE-EPINEPHRINE (PF) 0.5% -1:200000 IJ SOLN
INTRAMUSCULAR | Status: DC | PRN
  Administered 2021-11-16: 20 mL via PERINEURAL

## 2021-11-16 MED ORDER — BUPIVACAINE-EPINEPHRINE 0.25% -1:200000 IJ SOLN
INTRAMUSCULAR | Status: DC | PRN
  Administered 2021-11-16: 16 mL

## 2021-11-16 MED ORDER — LACTATED RINGERS IV SOLN: INTRAVENOUS | Status: DC

## 2021-11-16 MED ORDER — FENTANYL CITRATE (PF) 100 MCG/2ML IJ SOLN
INTRAMUSCULAR | Status: DC | PRN
  Administered 2021-11-16: 50 ug via INTRAVENOUS

## 2021-11-16 MED ORDER — DEXAMETHASONE SODIUM PHOSPHATE 10 MG/ML IJ SOLN
INTRAMUSCULAR | Status: AC
Start: 2021-11-16 — End: ?
  Filled 2021-11-16: qty 3

## 2021-11-16 SURGICAL SUPPLY — 50 items
APL PRP STRL LF DISP 70% ISPRP (MISCELLANEOUS) ×1
APL SKNCLS STERI-STRIP NONHPOA (GAUZE/BANDAGES/DRESSINGS) ×1
APPLIER CLIP 9.375 MED OPEN (MISCELLANEOUS) ×2
APR CLP MED 9.3 20 MLT OPN (MISCELLANEOUS) ×1
BENZOIN TINCTURE PRP APPL 2/3 (GAUZE/BANDAGES/DRESSINGS) ×2 IMPLANT
BLADE HEX COATED 2.75 (ELECTRODE) ×2 IMPLANT
BLADE SURG 15 STRL LF DISP TIS (BLADE) ×1 IMPLANT
BLADE SURG 15 STRL SS (BLADE) ×2
CANISTER SUC SOCK COL 7IN (MISCELLANEOUS) ×2 IMPLANT
CANISTER SUCT 1200ML W/VALVE (MISCELLANEOUS) IMPLANT
CHLORAPREP W/TINT 26 (MISCELLANEOUS) ×2 IMPLANT
CLIP APPLIE 9.375 MED OPEN (MISCELLANEOUS) IMPLANT
COVER BACK TABLE 60X90IN (DRAPES) ×2 IMPLANT
COVER MAYO STAND STRL (DRAPES) ×2 IMPLANT
COVER PROBE W GEL 5X96 (DRAPES) ×2 IMPLANT
DRAPE LAPAROSCOPIC ABDOMINAL (DRAPES) ×2 IMPLANT
DRAPE UTILITY XL STRL (DRAPES) ×2 IMPLANT
DRSG TEGADERM 4X4.75 (GAUZE/BANDAGES/DRESSINGS) ×4 IMPLANT
ELECT REM PT RETURN 9FT ADLT (ELECTROSURGICAL) ×2
ELECTRODE REM PT RTRN 9FT ADLT (ELECTROSURGICAL) ×1 IMPLANT
GAUZE SPONGE 4X4 12PLY STRL LF (GAUZE/BANDAGES/DRESSINGS) ×1 IMPLANT
GLOVE BIOGEL PI IND STRL 7.5 (GLOVE) ×1 IMPLANT
GLOVE BIOGEL PI INDICATOR 7.5 (GLOVE) ×1
GLOVE SURG SS PI 6.5 STRL IVOR (GLOVE) ×1 IMPLANT
GLOVE SURG SS PI 7.0 STRL IVOR (GLOVE) ×1 IMPLANT
GOWN STRL REUS W/ TWL LRG LVL3 (GOWN DISPOSABLE) ×2 IMPLANT
GOWN STRL REUS W/TWL LRG LVL3 (GOWN DISPOSABLE) ×4
KIT MARKER MARGIN INK (KITS) ×2 IMPLANT
NDL HYPO 25X1 1.5 SAFETY (NEEDLE) ×2 IMPLANT
NDL SAFETY ECLIPSE 18X1.5 (NEEDLE) ×1 IMPLANT
NEEDLE HYPO 18GX1.5 SHARP (NEEDLE) ×2
NEEDLE HYPO 25X1 1.5 SAFETY (NEEDLE) ×4 IMPLANT
NS IRRIG 1000ML POUR BTL (IV SOLUTION) IMPLANT
PACK BASIN DAY SURGERY FS (CUSTOM PROCEDURE TRAY) ×2 IMPLANT
PENCIL SMOKE EVACUATOR (MISCELLANEOUS) ×2 IMPLANT
SLEEVE SCD COMPRESS KNEE MED (STOCKING) ×2 IMPLANT
SPONGE T-LAP 18X18 ~~LOC~~+RFID (SPONGE) IMPLANT
SPONGE T-LAP 4X18 ~~LOC~~+RFID (SPONGE) ×2 IMPLANT
STRIP CLOSURE SKIN 1/2X4 (GAUZE/BANDAGES/DRESSINGS) ×2 IMPLANT
SUT MON AB 4-0 PC3 18 (SUTURE) ×2 IMPLANT
SUT SILK 2 0 SH (SUTURE) IMPLANT
SUT VIC AB 3-0 SH 27 (SUTURE) ×2
SUT VIC AB 3-0 SH 27X BRD (SUTURE) ×1 IMPLANT
SYR BULB EAR ULCER 3OZ GRN STR (SYRINGE) ×2 IMPLANT
SYR CONTROL 10ML LL (SYRINGE) ×4 IMPLANT
TOWEL GREEN STERILE FF (TOWEL DISPOSABLE) ×2 IMPLANT
TRACER MAGTRACE VIAL (MISCELLANEOUS) ×1 IMPLANT
TRAY FAXITRON CT DISP (TRAY / TRAY PROCEDURE) ×2 IMPLANT
TUBE CONNECTING 20X1/4 (TUBING) ×2 IMPLANT
YANKAUER SUCT BULB TIP NO VENT (SUCTIONS) IMPLANT

## 2021-11-16 NOTE — Anesthesia Procedure Notes (Signed)
Procedure Name: LMA Insertion Date/Time: 11/16/2021 12:49 PM  Performed by: Lawyer Washabaugh, Ernesta Amble, CRNAPre-anesthesia Checklist: Patient identified, Emergency Drugs available, Suction available and Patient being monitored Patient Re-evaluated:Patient Re-evaluated prior to induction Oxygen Delivery Method: Circle system utilized Preoxygenation: Pre-oxygenation with 100% oxygen Induction Type: IV induction Ventilation: Mask ventilation without difficulty LMA: LMA inserted LMA Size: 4.0 Number of attempts: 1 Airway Equipment and Method: Bite block Placement Confirmation: positive ETCO2 Tube secured with: Tape Dental Injury: Teeth and Oropharynx as per pre-operative assessment

## 2021-11-16 NOTE — Anesthesia Procedure Notes (Signed)
Anesthesia Regional Block: Pectoralis block   Pre-Anesthetic Checklist: , timeout performed,  Correct Patient, Correct Site, Correct Laterality,  Correct Procedure, Correct Position, site marked,  Risks and benefits discussed,  Surgical consent,  Pre-op evaluation,  At surgeon's request and post-op pain management  Laterality: Right         Needles:  Injection technique: Single-shot  Needle Type: Echogenic Stimulator Needle          Additional Needles:   Narrative:  Start time: 11/16/2021 12:10 PM End time: 11/16/2021 12:20 PM Injection made incrementally with aspirations every 5 mL.  Performed by: Personally  Anesthesiologist: Belinda Block, MD

## 2021-11-16 NOTE — Anesthesia Postprocedure Evaluation (Signed)
Anesthesia Post Note  Patient: [name]  Procedure(s) Performed: RIGHT BREAST RADIOACTIVE SEED LOCALIZED LUMPECTOMY WITH RIGHT AXILLARY SENTINEL LYMPH NODE BIOPSY (Right: Breast)     Patient location during evaluation: Other Anesthesia Type: General Level of consciousness: patient remains intubated per anesthesia plan Pain management: pain level controlled Vital Signs Assessment: post-procedure vital signs reviewed and stable Respiratory status: patient remains intubated per anesthesia plan Cardiovascular status: stable Postop Assessment: no apparent nausea or vomiting Anesthetic complications: no   No notable events documented.  Last Vitals:  Vitals:   11/16/21 1230 11/16/21 1235  BP: (!) 159/76 135/71  Pulse: (!) 53 71  Resp: 16 16  Temp:    SpO2: 99% 99%    Last Pain:  Vitals:   11/16/21 1147  TempSrc: Oral  PainSc: 0-No pain                 Kweku Stankey

## 2021-11-16 NOTE — Addendum Note (Signed)
Addendum  created 11/16/21 1531 by Belinda Block, MD   Child order released for a procedure order, Clinical Note Signed, Intraprocedure Blocks edited, Intraprocedure Meds edited, SmartForm saved

## 2021-11-16 NOTE — Anesthesia Postprocedure Evaluation (Signed)
Anesthesia Post Note  Patient: [name]  Procedure(s) Performed: RIGHT BREAST RADIOACTIVE SEED LOCALIZED LUMPECTOMY WITH RIGHT AXILLARY SENTINEL LYMPH NODE BIOPSY (Right: Breast)     Patient location during evaluation: PACU Anesthesia Type: General Level of consciousness: awake Pain management: pain level controlled Vital Signs Assessment: post-procedure vital signs reviewed and stable Cardiovascular status: stable Postop Assessment: no apparent nausea or vomiting Anesthetic complications: no   No notable events documented.  Last Vitals:  Vitals:   11/16/21 1415 11/16/21 1444  BP: 139/77 124/72  Pulse: 68 70  Resp: 17 18  Temp:  (!) 36.4 C  SpO2: 93% 94%    Last Pain:  Vitals:   11/16/21 1444  TempSrc: Oral  PainSc: 0-No pain                 Serita Degroote

## 2021-11-16 NOTE — Transfer of Care (Signed)
Immediate Anesthesia Transfer of Care Note  Patient: [name]  Procedure(s) Performed: RIGHT BREAST RADIOACTIVE SEED LOCALIZED LUMPECTOMY WITH RIGHT AXILLARY SENTINEL LYMPH NODE BIOPSY (Right: Breast)  Patient Location: PACU  Anesthesia Type:General  Level of Consciousness: drowsy and patient cooperative  Airway & Oxygen Therapy: Patient Spontanous Breathing and Patient connected to face mask oxygen  Post-op Assessment: Report given to RN and Post -op Vital signs reviewed and stable  Post vital signs: Reviewed and stable  Last Vitals:  Vitals Value Taken Time  BP    Temp    Pulse 68 11/16/21 1352  Resp 15 11/16/21 1352  SpO2 98 % 11/16/21 1352  Vitals shown include unvalidated device data.  Last Pain:  Vitals:   11/16/21 1147  TempSrc: Oral  PainSc: 0-No pain         Complications: No notable events documented.

## 2021-11-16 NOTE — Op Note (Signed)
Pre-op Diagnosis: Invasive ductal carcinoma right breast Post-op Diagnosis: same Procedure: Right breast radioactive seed localized lumpectomy and sentinel lymph node biopsy with Magtrace injection Surgeon:  Maia Petties. Anesthesia:  GEN - LMA/ PEC block Indications:    This is a 61 year old female who recently underwent routine screening mammogram which revealed a possible mass in the right upper outer quadrant.  Further work-up revealed an 8 x 6 x 7 mm irregular mass at the right breast 1 o'clock 13 cm from nipple.  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  After thorough discussion with the patient, we have elected to perform lumpectomy with sentinel lymph node biopsy.  A radioactive seed was placed at the site of the primary tumor yesterday.  She was injected with mag trace as well as radiotracer in the preoperative area.   Description of procedure: The patient is brought to the operating room placed in supine position on the operating room table. After an adequate level of general anesthesia was obtained, we injected methylene blue dye solution into the dermis around her nipple.  Her right breast and axilla were prepped with ChloraPrep and draped in sterile fashion. A timeout was taken to ensure the proper patient and proper procedure. We interrogated the breast with the neoprobe. We made a transverse incision over the mass located at 1:00 in the upper breast after infiltrating with 0.25% Marcaine. Dissection was carried down in the breast tissue with cautery. We used the neoprobe to guide [name] towards the radioactive seed. We excised an area of tissue around the radioactive seed 2 cm in diameter. The specimen was removed and was oriented with a paint kit. Specimen mammogram showed the radioactive seed as well as the biopsy clip within the specimen. This was sent for pathologic examination. There is no residual radioactivity within the biopsy cavity. Clips were  placed in all five margins.  We inspected carefully for hemostasis. The wound was thoroughly irrigated.   We then turned our attention to the axilla.  The settings were adjusted on the Neoprobe and we interrogated the axilla.  I made a transverse incision over the area of activity.  The Sentimag probe did not show any activity in this area.  The magnetic dye still seemed concentrated at the site of injection near the nipple.  We dissected into the axilla and identified a radioactive lymph node.  2 lymph nodes were clustered together.  These were dissected free together and sent as sentinel lymph node.  There was minimal background activity.  We inspected both wounds carefully for hemostasis.  The wounds were closed with a deep layer of 3-0 Vicryl and a subcuticular layer of 4-0 Monocryl. Benzoin Steri-Strips were applied. The patient was then extubated and brought to the recovery room in stable condition. All sponge, instrument, and needle counts are correct.  Imogene Burn. Georgette Dover, MD, [facility]  General/ Trauma Surgery  03/15/2020 1:38 PM

## 2021-11-16 NOTE — Interval H&P Note (Signed)
History and Physical Interval Note:  11/16/2021 11:32 AM  [name]  has presented today for surgery, with the diagnosis of RIGHT BREAST INVASIVE DUCTAL CARCINOMA.  The various methods of treatment have been discussed with the patient and family. After consideration of risks, benefits and other options for treatment, the patient has consented to  Procedure(s) with comments: RIGHT BREAST RADIOACTIVE SEED LOCALIZED LUMPECTOMY WITH RIGHT AXILLARY SENTINEL LYMPH NODE BIOPSY (Right) - LMA & PEC BLOCK as a surgical intervention.  The patient's history has been reviewed, patient examined, no change in status, stable for surgery.  I have reviewed the patient's chart and labs.  Questions were answered to the patient's satisfaction.     Maia Petties

## 2021-11-16 NOTE — Progress Notes (Signed)
Emotional support provided through nuclear medicine breast injections. Vital signs stable. Patient tolerated well.  

## 2021-11-16 NOTE — Anesthesia Preprocedure Evaluation (Addendum)
Anesthesia Evaluation  Patient identified by MRN, date of birth, ID band Patient awake    Airway Mallampati: II  TM Distance: >3 FB     Dental   Pulmonary former smoker,    breath sounds clear to auscultation       Cardiovascular hypertension, + angina + CAD and +CHF   Rhythm:Regular Rate:Normal     Neuro/Psych    GI/Hepatic GERD  ,  Endo/Other  diabetes  Renal/GU      Musculoskeletal   Abdominal   Peds  Hematology   Anesthesia Other Findings   Reproductive/Obstetrics                            Anesthesia Physical Anesthesia Plan  ASA: 3  Anesthesia Plan: General   Post-op Pain Management:    Induction: Intravenous  PONV Risk Score and Plan:   Airway Management Planned: LMA  Additional Equipment:   Intra-op Plan:   Post-operative Plan: Extubation in OR  Informed Consent: I have reviewed the patients History and Physical, chart, labs and discussed the procedure including the risks, benefits and alternatives for the proposed anesthesia with the patient or authorized representative who has indicated his/her understanding and acceptance.     Dental advisory given  Plan Discussed with: Anesthesiologist and CRNA  Anesthesia Plan Comments:        Anesthesia Quick Evaluation

## 2021-11-16 NOTE — Discharge Instructions (Addendum)
[facility] Office Phone Number [phone]  BREAST BIOPSY/ PARTIAL MASTECTOMY: POST OP INSTRUCTIONS  Always review your discharge instruction sheet given to you by the facility where your surgery was performed.  IF YOU HAVE DISABILITY OR FAMILY LEAVE FORMS, YOU MUST BRING THEM TO THE OFFICE FOR PROCESSING.  DO NOT GIVE THEM TO YOUR DOCTOR.  A prescription for pain medication may be given to you upon discharge.  Take your pain medication as prescribed, if needed.  If narcotic pain medicine is not needed, then you may take acetaminophen (Tylenol) or ibuprofen (Advil) as needed. Take your usually prescribed medications unless otherwise directed If you need a refill on your pain medication, please contact your pharmacy.  They will contact our office to request authorization.  Prescriptions will not be filled after 5pm or on week-ends. You should eat very light the first 24 hours after surgery, such as soup, crackers, pudding, etc.  Resume your normal diet the day after surgery. Most patients will experience some swelling and bruising in the breast.  Ice packs and a good support bra will help.  Swelling and bruising can take several days to resolve.  It is common to experience some constipation if taking pain medication after surgery.  Increasing fluid intake and taking a stool softener will usually help or prevent this problem from occurring.  A mild laxative (Milk of Magnesia or Miralax) should be taken according to package directions if there are no bowel movements after 48 hours. Unless discharge instructions indicate otherwise, you may remove your bandages 24-48 hours after surgery, and you may shower at that time.  You may have steri-strips (small skin tapes) in place directly over the incision.  These strips should be left on the skin for 7-10 days.  If your surgeon used skin glue on the incision, you may shower in 24 hours.  The glue will flake off over the next 2-3 weeks.  Any  sutures or staples will be removed at the office during your follow-up visit. ACTIVITIES:  You may resume regular daily activities (gradually increasing) beginning the next day.  Wearing a good support bra or sports bra minimizes pain and swelling.  You may have sexual intercourse when it is comfortable. You may drive when you no longer are taking prescription pain medication, you can comfortably wear a seatbelt, and you can safely maneuver your car and apply brakes. RETURN TO WORK:  ______________________________________________________________________________________ Dennis Bast should see your doctor in the office for a follow-up appointment approximately two weeks after your surgery.  Your doctor's nurse will typically make your follow-up appointment when she calls you with your pathology report.  Expect your pathology report 2-3 business days after your surgery.  You may call to check if you do not hear from [name] after three days. OTHER INSTRUCTIONS: _______________________________________________________________________________________________ _____________________________________________________________________________________________________________________________________ _____________________________________________________________________________________________________________________________________ _____________________________________________________________________________________________________________________________________  WHEN TO CALL YOUR DOCTOR: Fever over 101.0 Nausea and/or vomiting. Extreme swelling or bruising. Continued bleeding from incision. Increased pain, redness, or drainage from the incision.  The clinic staff is available to answer your questions during regular business hours.  Please don't hesitate to call and ask to speak to one of the nurses for clinical concerns.  If you have a medical emergency, go to the nearest emergency room or call 911.  A surgeon from [facility] is always on call at the hospital.  For further questions, please visit [email or web address]    No Tylenol before 6pm tonight, if needed.   Post Anesthesia Home Care Instructions  Activity: Get plenty of rest for the remainder of the day. A responsible individual must stay with you for 24 hours following the procedure.  For the next 24 hours, DO NOT: -Drive a car -Paediatric nurse -Drink alcoholic beverages -Take any medication unless instructed by your physician -Make any legal decisions or sign important papers.  Meals: Start with liquid foods such as gelatin or soup. Progress to regular foods as tolerated. Avoid greasy, spicy, heavy foods. If nausea and/or vomiting occur, drink only clear liquids until the nausea and/or vomiting subsides. Call your physician if vomiting continues.  Special Instructions/Symptoms: Your throat may feel dry or sore from the anesthesia or the breathing tube placed in your throat during surgery. If this causes discomfort, gargle with warm salt water. The discomfort should disappear within 24 hours.  If you had a scopolamine patch placed behind your ear for the management of post- operative nausea and/or vomiting:  1. The medication in the patch is effective for 72 hours, after which it should be removed.  Wrap patch in a tissue and discard in the trash. Wash hands thoroughly with soap and water. 2. You may remove the patch earlier than 72 hours if you experience unpleasant side effects which may include dry mouth, dizziness or visual disturbances. 3. Avoid touching the patch. Wash your hands with soap and water after contact with the patch.

## 2021-11-16 NOTE — Progress Notes (Signed)
Assisted Dr. Nyoka Cowden with right, pectoralis, ultrasound guided block. Side rails up, monitors on throughout procedure. See vital signs in flow sheet. Tolerated Procedure well.

## 2021-11-17 ENCOUNTER — Encounter (HOSPITAL_BASED_OUTPATIENT_CLINIC_OR_DEPARTMENT_OTHER): Payer: Self-pay | Admitting: Surgery

## 2021-11-18 LAB — SURGICAL PATHOLOGY

## 2021-11-21 ENCOUNTER — Ambulatory Visit (INDEPENDENT_AMBULATORY_CARE_PROVIDER_SITE_OTHER): Payer: Medicare Other | Admitting: Family Medicine

## 2021-11-21 ENCOUNTER — Encounter (INDEPENDENT_AMBULATORY_CARE_PROVIDER_SITE_OTHER): Payer: Self-pay | Admitting: Family Medicine

## 2021-11-21 VITALS — BP 134/83 | HR 74 | Temp 98.5°F | Ht 59.0 in | Wt 198.0 lb

## 2021-11-21 DIAGNOSIS — E1165 Type 2 diabetes mellitus with hyperglycemia: Secondary | ICD-10-CM | POA: Diagnosis not present

## 2021-11-21 DIAGNOSIS — E559 Vitamin D deficiency, unspecified: Secondary | ICD-10-CM | POA: Diagnosis not present

## 2021-11-21 DIAGNOSIS — F419 Anxiety disorder, unspecified: Secondary | ICD-10-CM

## 2021-11-21 DIAGNOSIS — Z6841 Body Mass Index (BMI) 40.0 and over, adult: Secondary | ICD-10-CM

## 2021-11-21 DIAGNOSIS — F32A Depression, unspecified: Secondary | ICD-10-CM

## 2021-11-21 DIAGNOSIS — Z91013 Allergy to seafood: Secondary | ICD-10-CM

## 2021-11-21 DIAGNOSIS — Z7984 Long term (current) use of oral hypoglycemic drugs: Secondary | ICD-10-CM

## 2021-11-21 DIAGNOSIS — C50211 Malignant neoplasm of upper-inner quadrant of right female breast: Secondary | ICD-10-CM

## 2021-11-21 DIAGNOSIS — Z7985 Long-term (current) use of injectable non-insulin antidiabetic drugs: Secondary | ICD-10-CM

## 2021-11-21 DIAGNOSIS — E669 Obesity, unspecified: Secondary | ICD-10-CM

## 2021-11-21 MED ORDER — OZEMPIC (0.25 OR 0.5 MG/DOSE) 2 MG/1.5ML ~~LOC~~ SOPN: 0.5000 mg | PEN_INJECTOR | SUBCUTANEOUS | 0 refills | Status: DC

## 2021-11-21 MED ORDER — METFORMIN HCL 500 MG PO TABS: 500.0000 mg | ORAL_TABLET | Freq: Every day | ORAL | 0 refills | Status: DC

## 2021-11-21 MED ORDER — VITAMIN D (ERGOCALCIFEROL) 1.25 MG (50000 UNIT) PO CAPS: 50000.0000 [IU] | ORAL_CAPSULE | ORAL | 0 refills | Status: DC

## 2021-11-22 ENCOUNTER — Encounter: Payer: Self-pay | Admitting: Genetic Counselor

## 2021-11-22 ENCOUNTER — Ambulatory Visit: Payer: Self-pay | Admitting: Genetic Counselor

## 2021-11-22 ENCOUNTER — Telehealth: Payer: Self-pay | Admitting: Genetic Counselor

## 2021-11-22 ENCOUNTER — Ambulatory Visit (INDEPENDENT_AMBULATORY_CARE_PROVIDER_SITE_OTHER): Payer: Medicare Other | Admitting: Family Medicine

## 2021-11-22 ENCOUNTER — Encounter: Payer: Self-pay | Admitting: *Deleted

## 2021-11-22 DIAGNOSIS — Z1379 Encounter for other screening for genetic and chromosomal anomalies: Secondary | ICD-10-CM | POA: Insufficient documentation

## 2021-11-22 DIAGNOSIS — Z803 Family history of malignant neoplasm of breast: Secondary | ICD-10-CM

## 2021-11-22 DIAGNOSIS — Z800 Family history of malignant neoplasm of digestive organs: Secondary | ICD-10-CM

## 2021-11-22 DIAGNOSIS — C50411 Malignant neoplasm of upper-outer quadrant of right female breast: Secondary | ICD-10-CM

## 2021-11-22 NOTE — Telephone Encounter (Signed)
Revealed negative genetic testing.  See detailed clinic note.

## 2021-11-22 NOTE — Progress Notes (Signed)
HPI:   Ms. Casebolt was previously seen in the [place] clinic due to a personal and family history of breast cancer and concerns regarding a hereditary predisposition to cancer. Please refer to our prior cancer genetics clinic note for more information regarding our discussion, assessment and recommendations, at the time. Ms. Maring recent genetic test results were disclosed to her, as were recommendations warranted by these results. These results and recommendations are discussed in more detail below.  CANCER HISTORY:  Oncology History  Malignant neoplasm of upper-outer quadrant of female breast ([place])  10/07/2021 Mammogram   Right breast, possible mass warrants further evaluation, no findings suspicious for malignancy in the left breast.Targeted ultrasound is performed, showing a 8 x 6 x 7 mm irregular mass at the right breast 1 o'clock 13 cm from nipple correlating to the mammographic mass. Ultrasound of the right axilla is negative.   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  Report date is November 21, 2021.   The CustomNext-Cancer+RNAinsight panel offered by [facility] includes sequencing and rearrangement analysis for the following 47 genes:  APC, ATM, AXIN2, BARD1, BMPR1A, BRCA1, BRCA2, BRIP1, CDH1, CDK4, CDKN2A, CHEK2, DICER1, EPCAM, GREM1, HOXB13, MEN1, MLH1, MSH2, MSH3, MSH6, MUTYH, NBN, NF1, NF2, NTHL1, PALB2, PMS2, POLD1, POLE, PTEN, RAD51C, RAD51D, RECQL, RET, SDHA, SDHAF2, SDHB, SDHC, SDHD, SMAD4, SMARCA4, STK11, TP53, TSC1, TSC2, and VHL.  RNA data is routinely analyzed for use in variant interpretation for all genes.       FAMILY HISTORY:  We obtained a detailed, 4-generation  family history.  Significant diagnoses are listed below:      Family History  Problem Relation Age of Onset   Breast cancer Maternal Aunt 63 - 40   Colon cancer Maternal Grandfather          dx after 15   Breast cancer Cousin          dx 65s       Ms. Bluestone is unaware of previous family history of genetic testing for hereditary cancer risks. There is no reported Ashkenazi Jewish ancestry. There is no known consanguinity.  GENETIC TEST RESULTS:  The [facility] CustomNext-Cancer +RNAinsight Panel found no pathogenic mutations.   The CustomNext-Cancer+RNAinsight panel offered by [facility] includes sequencing and rearrangement analysis for the following 47 genes:  APC, ATM, AXIN2, BARD1, BMPR1A, BRCA1, BRCA2, BRIP1, CDH1, CDK4, CDKN2A, CHEK2, DICER1, EPCAM, GREM1, HOXB13, MEN1, MLH1, MSH2, MSH3, MSH6, MUTYH, NBN, NF1, NF2, NTHL1, PALB2, PMS2, POLD1, POLE, PTEN, RAD51C, RAD51D, RECQL, RET, SDHA, SDHAF2, SDHB, SDHC, SDHD, SMAD4, SMARCA4, STK11, TP53, TSC1, TSC2, and VHL.  RNA data is routinely analyzed for use in variant interpretation for all genes. .   The test report has been scanned into EPIC and is located under the Molecular Pathology section of the Results Review tab.  A portion of the result report is included below for reference. Genetic testing reported out on November 21, 2021.       Even though a pathogenic variant was not identified, possible explanations for the cancer in the family may include: There may be no hereditary risk for cancer in the family. The cancers in Ms. Henault and/or her family may be sporadic/familial or due to other genetic and  environmental factors. There may be a gene mutation in one of these genes that current testing methods cannot detect but that chance is small. There could be another gene that has not yet been discovered, or that we have not yet tested, that is responsible for the cancer diagnoses in the family.  It is also possible there is a  hereditary cause for the cancer in the family that Ms. Alpert did not inherit.   Therefore, it is important to remain in touch with cancer genetics in the future so that we can continue to offer Ms. Hendricksen the most up to date genetic testing.     ADDITIONAL GENETIC TESTING:  We discussed with Ms. Horacek that her genetic testing was fairly extensive.  If there are additional relevant genes identified to increase cancer risk that can be analyzed in the future, we would be happy to discuss and coordinate this testing at that time.    CANCER SCREENING RECOMMENDATIONS:  Ms. Blakley test result is considered negative (normal).  This means that we have not identified a hereditary cause for her personal history of breast cancer at this time.   An individual's cancer risk and medical management are not determined by genetic test results alone. Overall cancer risk assessment incorporates additional factors, including personal medical history, family history, and any available genetic information that may result in a personalized plan for cancer prevention and surveillance. Therefore, it is recommended she continue to follow the cancer management and screening guidelines provided by her oncology and primary healthcare provider.   RECOMMENDATIONS FOR FAMILY MEMBERS:   Since she did not inherit a identifiable mutation in a cancer predisposition gene included on this panel, her children could not have inherited a known mutation from her in one of these genes. Individuals in this family might be at some increased risk of developing cancer, over the general population risk, due to the family history of cancer.  Individuals in the family should notify their providers of the family history of cancer. We recommend women in this family have a yearly mammogram beginning at age 30, or 7 years younger than the earliest onset of cancer, an annual clinical breast exam, and perform monthly breast  self-exams.   Other members of the family may still carry a pathogenic variant in one of these genes that Ms. Preisler did not inherit. Based on the family history, we recommend her maternal aunt and cousin who had breast cancer before age 49 have genetic counseling and testing. Ms. Schoff will let us know if we can be of any assistance in coordinating genetic counseling and/or testing for this family member.     FOLLOW-UP:  Lastly, we discussed with Ms. Hurston that cancer genetics is a rapidly advancing field and it is possible that new genetic tests will be appropriate for her and/or her family members in the future. We encouraged her to remain in contact with cancer genetics on an annual basis so we can update her personal and family histories and let her know of advances in cancer genetics that may benefit this family.   Our contact number was provided. Ms. Austin questions were answered to her satisfaction, and she knows she is welcome to call us at anytime with additional questions or concerns.   Analiese Krupka M. Joette Catching, [place], [facility] Genetic Counselor [email or web address] (P) [phone]

## 2021-11-23 ENCOUNTER — Encounter (HOSPITAL_COMMUNITY): Payer: Self-pay

## 2021-11-23 ENCOUNTER — Other Ambulatory Visit (INDEPENDENT_AMBULATORY_CARE_PROVIDER_SITE_OTHER): Payer: Self-pay | Admitting: Family Medicine

## 2021-11-23 DIAGNOSIS — E1165 Type 2 diabetes mellitus with hyperglycemia: Secondary | ICD-10-CM

## 2021-11-24 IMAGING — CR DG CHEST 2V
2 series · 2 of 2 positions shown · non-contrast
Comparison: 11/08/2019

CLINICAL DATA: Chest pain

EXAM:
CHEST - 2 VIEW

[w chest lat]
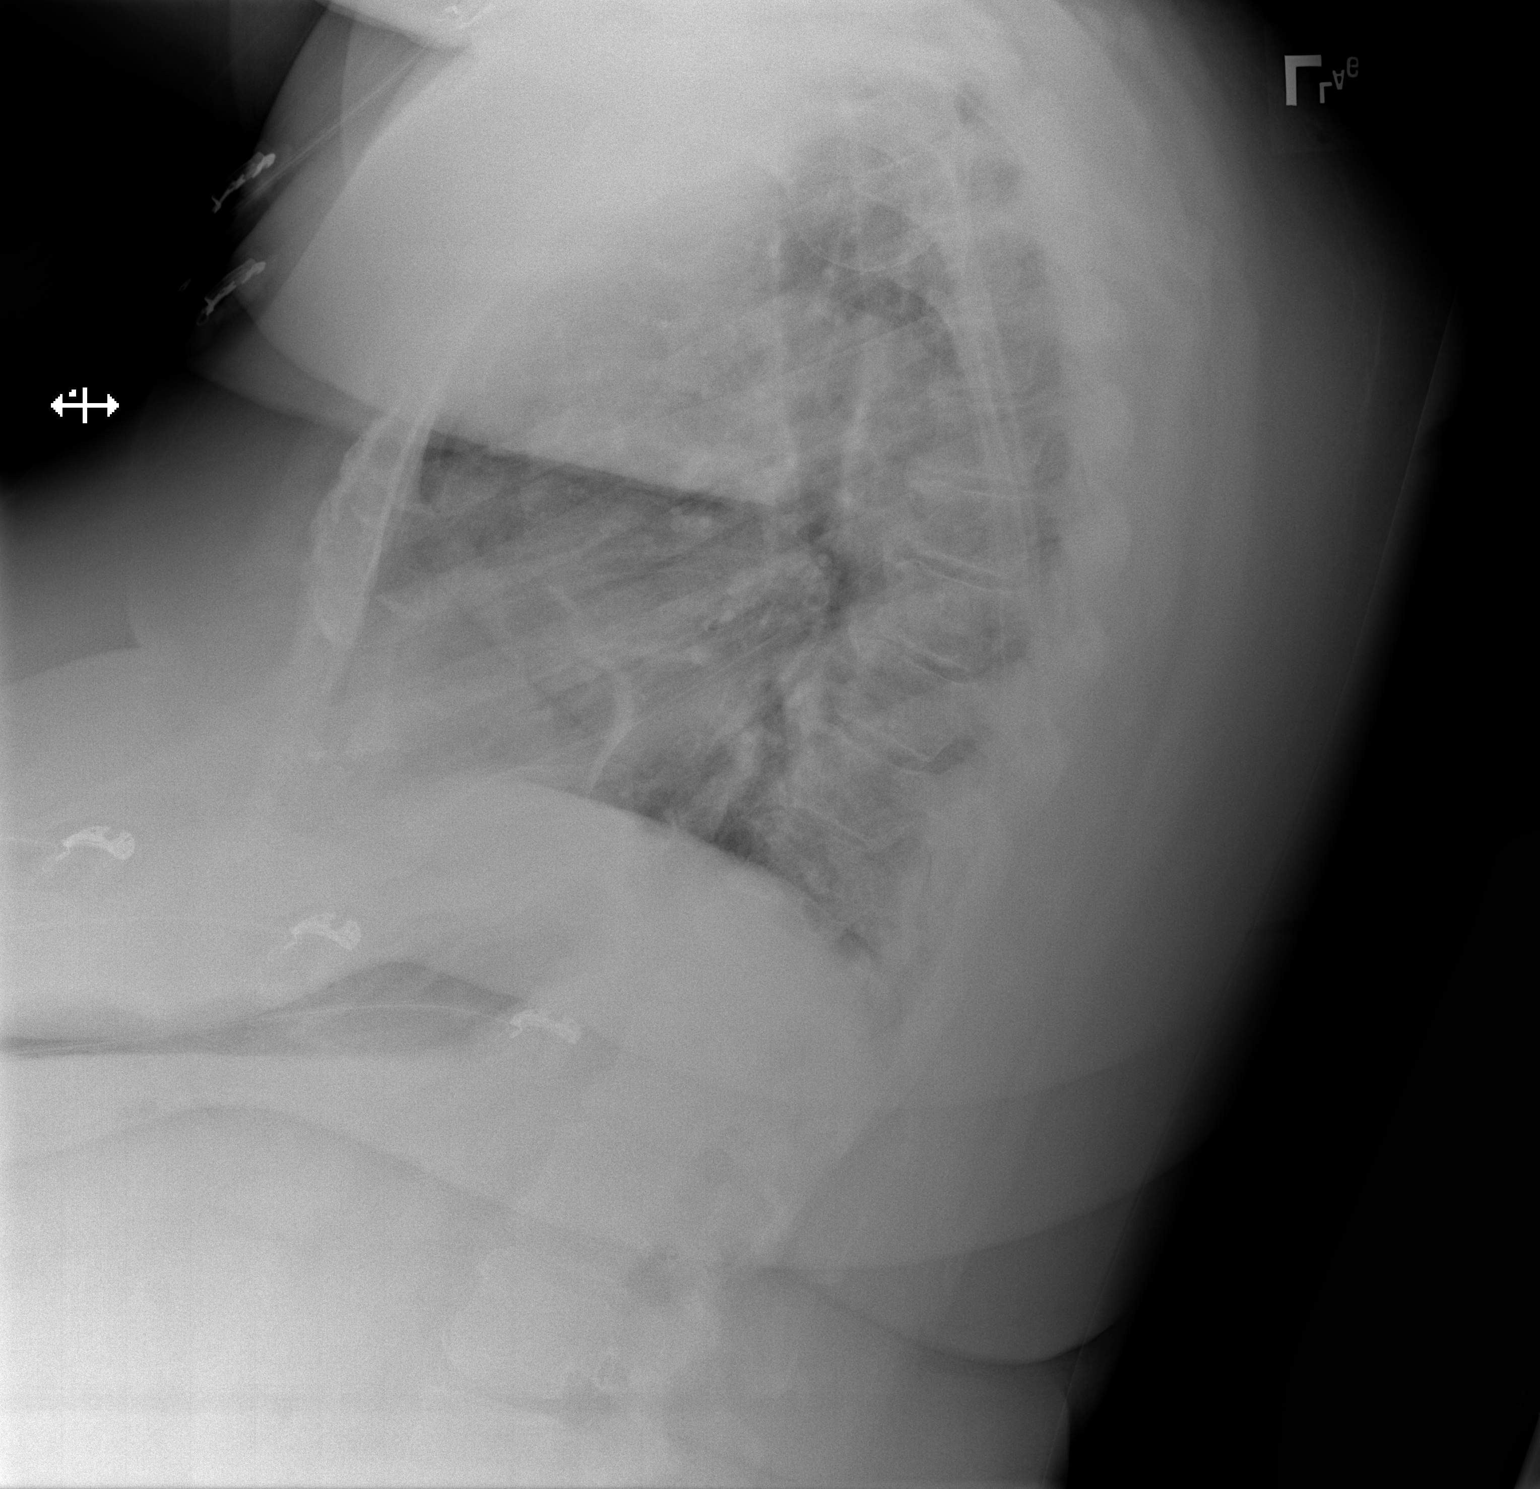

[x chest ap]
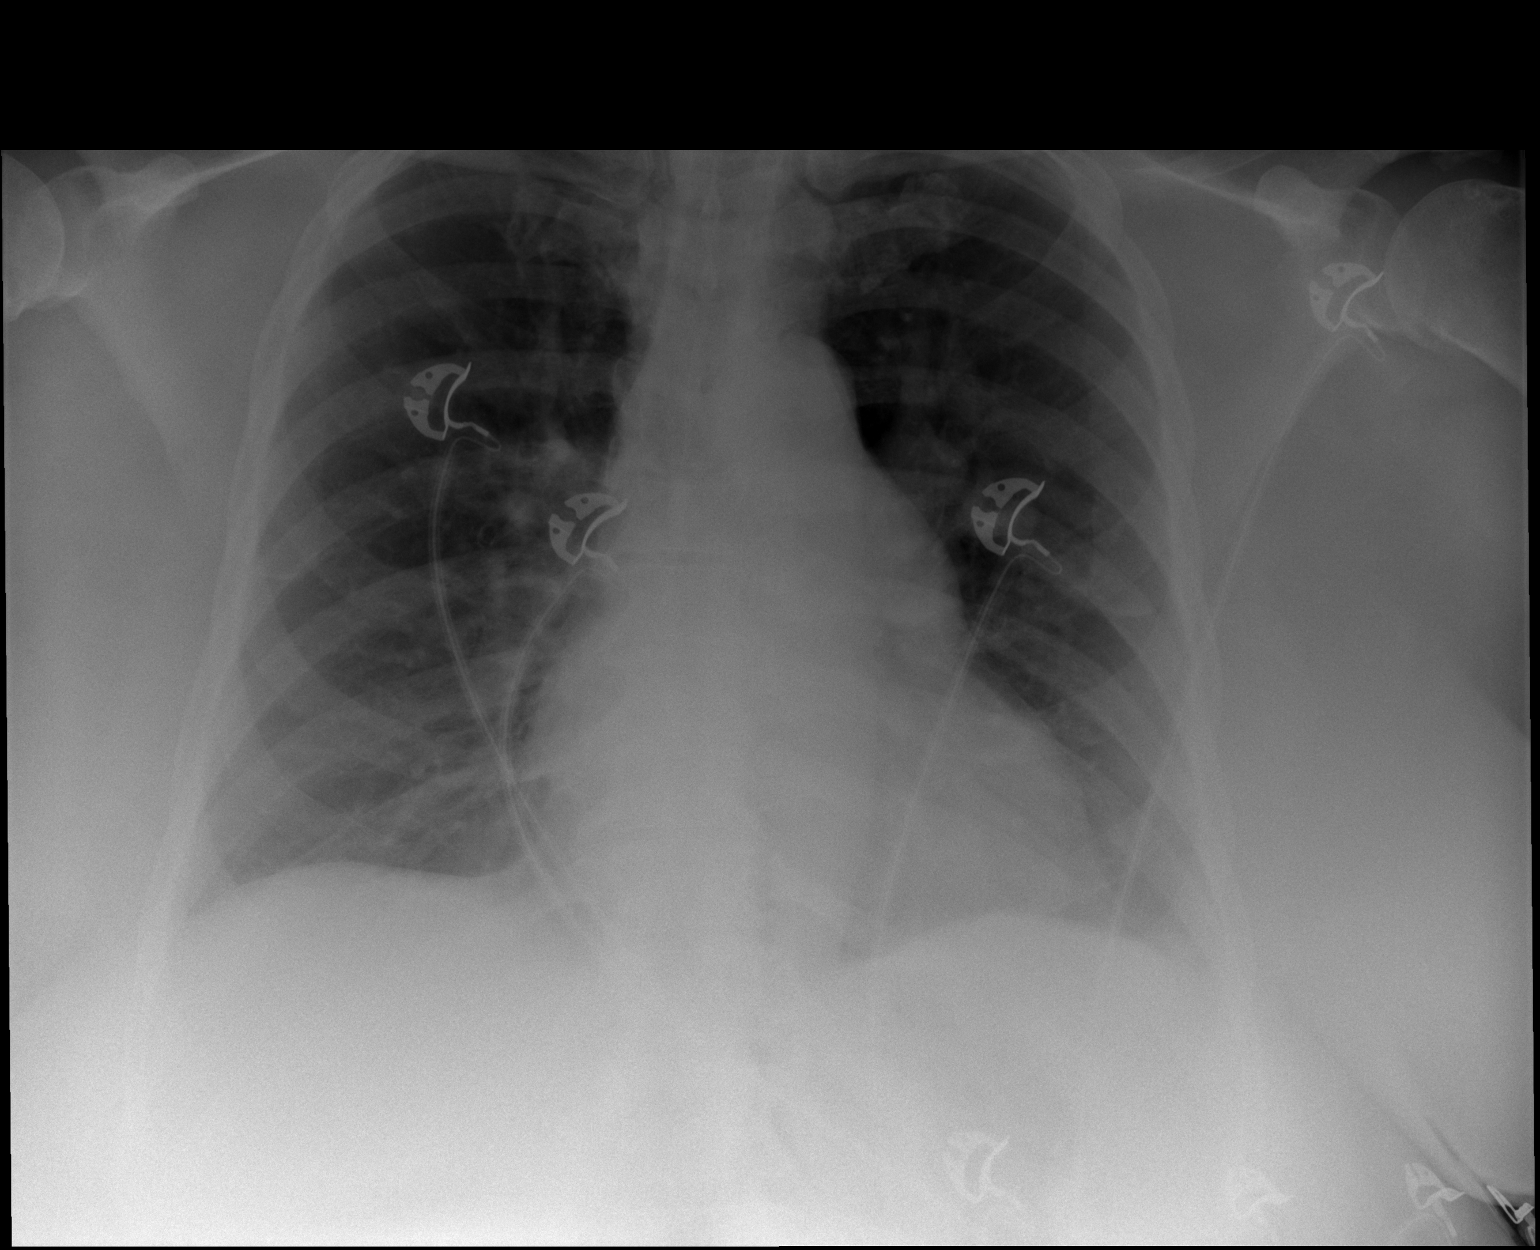

[2 of 2 positions shown; findings below may reference images not displayed]

FINDINGS: No focal opacity or pleural effusion. Cardiomediastinal silhouette
within normal limits. No pneumothorax.
IMPRESSION: No active cardiopulmonary disease.

## 2021-11-28 ENCOUNTER — Ambulatory Visit (INDEPENDENT_AMBULATORY_CARE_PROVIDER_SITE_OTHER): Payer: Medicare Other | Admitting: Internal Medicine

## 2021-11-28 ENCOUNTER — Encounter: Payer: Self-pay | Admitting: Internal Medicine

## 2021-11-28 VITALS — BP 124/70 | HR 74 | Ht 59.0 in | Wt 202.0 lb

## 2021-11-28 DIAGNOSIS — E042 Nontoxic multinodular goiter: Secondary | ICD-10-CM | POA: Diagnosis not present

## 2021-11-28 NOTE — Patient Instructions (Signed)
Please come back in 1 year.

## 2021-11-28 NOTE — Progress Notes (Signed)
Patient ID: [name], female   DOB: [birth year], 61 y.o.   MRN: [id number]  HPI  [name] is a 61 y.o.-year-old female, initially referred by her PCP, Sonia Side., FNP, returning for follow-up for thyroid nodules.  Interim history: Since last visit, she was diagnosed with invasive ductal breast cancer -per biopsy 11/16/2021. She had resection. It is not clear yet if she needs RAI tx or ChTx. Had an allergic reaction to shellfish >> was on Prednisone. She lost weight since last OV.  Reviewed and addended history: 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 She was followed for this by surgery reportedly (no records available for review) with imaging every 3-6 mo. Since no growth was detected >> she did not have surgery.  She was also reported to have a mediastinal mass apparently connected to the thyroid on 2 CT angio studies from 2021 (3 x 2.5 cm in size).  She also has a history of PE in 2008.  She presented with chest pain and cough and was again investigated for PE via a CT chest in 02/2021 that showed a retrosternal likely right thyroid lobe nodule.  Upon questioning, she does mention she noticed pressure in neck and neck swelling 1 year prior to this finding, but this improved  Reviewed available reports/images/pathology results: Chest CT angio (08/14/2019):  Enlarged, heterogeneous and multinodular thyroid including a nodule which extends inferiorly from the right thyroid lobe into the thoracic inlet measuring up to 2.3 cm in size.   Chest CT angio (09/25/2019): Thyroid remains enlarged with a dominant mass arising from the right lobe of the thyroid measuring 3.0 x 2.5 cm. Thyroid otherwise appears somewhat inhomogeneous. There is no appreciable thoracic adenopathy. Small hiatal hernia noted.  Thyroid U/S (05/27/2020): Parenchymal Echotexture: Moderately heterogenous  Isthmus: 0.7 cm  Right  lobe: 6.5 x 3 1 x 2.8 cm  Left lobe: 4.5 x 1.6 x 1.5 cm  _________________________________________________________   Nodule # 1:  Location: Right; Inferior  Maximum size: 3.8 cm; Other 2 dimensions: 3.1 x 2.8 cm  Composition: solid/almost completely solid (2)  Echogenicity: isoechoic (1)  **Given size (>/= 2.5 cm) and appearance, fine needle aspiration of this mildly suspicious nodule should be considered based on TI-RADS criteria.  _________________________________________________________   IMPRESSION: Right inferior solid thyroid nodule (versus pseudonodule given diffusely heterogeneous thyroid parenchyma) measuring up to approximately 3.8 cm, mildly suspicious for malignancy (TI-RADS category 3). This lesion meets criteria for tissue sampling. Recommend ultrasound guided fine needle aspiration.  FNA (11/04/2020): Clinical History: Right inferior 3.8cm; Other 2 dimensions: 3.1 x 2.8cm,  Solid / almost completely solid, Isoechoic, TI-RADS total points 3  Specimen Submitted:  A. THYROID, RLP, FINE NEEDLE ASPIRATION:   FINAL MICROSCOPIC DIAGNOSIS:  - Consistent with benign follicular nodule (Bethesda category II)   SPECIMEN ADEQUACY:  Satisfactory for evaluation   Chest CT with and without contrast (03/07/2021):  Stable, rounded, retrosternal density that is slightly exophytic from the large right thyroid lobe.   Previously seen 3.4 cm right thyroid nodule was not visible anymore.  Thyroid U/S (06/10/2021): Parenchymal Echotexture: Moderately heterogenous Isthmus: 0.7 cm  Right lobe: 6.2 x 2.3 x 2.5 cm  Left lobe: 4.1 x 1.3 x 2.0 cm  _________________________________________________________   Diffusely heterogeneous thyroid gland with extensive goitrous change. Multiple small thyroid nodules are identified in bilaterally measuring less than 1 cm. These are considered incidental and warrant  no further follow-up.   Nodule # 3: Approximately 1.3 cm isoechoic solid nodule  in the right mid gland does not meet criteria to warrant further evaluation.   Nodule # 4: Previously biopsied mass in the lower pole of the right gland measures 3.1 x 3.0 x 3.0 cm. This is slightly smaller than 3.1 x 3.1 x 2.8 cm measured previously.   No new nodules or suspicious features.   IMPRESSION: 1. Similar appearance of diffusely enlarged, heterogeneous and multinodular thyroid gland most consistent with multinodular goiter. 2. Slight interval decrease in size of previously biopsied mass in the lower pole of the right gland. 3. No new nodules or suspicious features identified.   Pt denies: - feeling nodules in neck - choking - SOB with lying down She has occasional dysphagia with pills.  No hoarseness.  I reviewed pt's thyroid tests: Lab Results  Component Value Date   TSH 0.506 04/23/2021   TSH 0.458 03/05/2019   TSH 0.638 11/20/2017   TSH 0.112 (L) 05/24/2016   TSH 0.345 (L) 06/18/2014   TSH 0.167 (L) 10/28/2012   TSH 0.218 (L) 01/09/2012   TSH 0.681 08/06/2007   TSH 0.559 * 04/05/2007   TSH 0.449 01/16/2007    FREET4 1.39 11/20/2017   FREET4 1.32 08/06/2007    Improved.: - fatigue - hot flushes - anxiety/depression  Resolved: - weight gain  No: - tremors - palpitations - hyperdefecation/constipation - dry skin - hair loss  + FH of thyroid ds. - in sister (? Condition). No known FH of thyroid cancer. No h/o radiation tx to head or neck. No recent contrast studies. No steroid use. No herbal supplements. No Biotin supplements or Hair, Skin and Nails vitamins.  Pt. has a history of DM2 -on Ozempic, Jentadueto.This is managed by PCP.  Latest HbA1c is much improved: Lab Results  Component Value Date   HGBA1C 6.4 (H) 09/28/2021   Pt. also has a history of CAD, dCHF, h/o TIA, HL, HTN, OSA (on CPAP), glaucoma, h/o PE in 2008, depression. She was prev. [facility]en [facility]  ROS: + See HPI  Past Medical History:  Diagnosis Date    Allergy    Anginal pain ([place])    a. NL cath in 2008;  b. Myoview 03/2011: dec uptake along mid anterior wall on stress imaging -> ? attenuation vs. ischemia, EF 65%;  c. 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 benign thymoma   Neuromuscular disorder ([place])    Obesity    Palpitations    Pneumonia 05/2016   double   Pulmonary edema    Pulmonary embolism ([place])    a. 2008 -> coumadin x 6 mos.  Rheumatoid arthritis ([place])    Sleep apnea    on CPAP 02/2018   SOB (shortness of breath)    Thyroid disease    TIA (transient ischemic attack)    Urinary urgency    Vitamin D deficiency    Past Surgical History:  Procedure Laterality Date   ABDOMINAL HYSTERECTOMY  2005   APPENDECTOMY     BREAST LUMPECTOMY WITH RADIOACTIVE SEED AND SENTINEL LYMPH NODE BIOPSY Right 11/16/2021   Procedure: RIGHT BREAST RADIOACTIVE SEED LOCALIZED LUMPECTOMY WITH RIGHT AXILLARY SENTINEL LYMPH NODE BIOPSY;  Surgeon: Donnie Mesa, MD;  Location: [facility];  Service: General;  Laterality: Right;  LMA & PEC BLOCK   CARDIAC CATHETERIZATION     Normal   CARDIAC CATHETERIZATION N/A 09/30/2014   Procedure: Left Heart Cath and Coronary Angiography;  Surgeon: Sherren Mocha, MD;  Location: [facility];  Service: Cardiovascular;  Laterality: N/A;   LAPAROSCOPIC APPENDECTOMY N/A 06/03/2012   Procedure: APPENDECTOMY LAPAROSCOPIC;  Surgeon: Stark Klein, MD;  Location: [facility];   Service: General;  Laterality: N/A;   Left knee surgery  2008   LEG SURGERY     TONSILLECTOMY     TOTAL HIP ARTHROPLASTY Left 06/11/2018   Procedure: LEFT TOTAL HIP ARTHROPLASTY ANTERIOR APPROACH;  Surgeon: Meredith Pel, MD;  Location: [facility];  Service: Orthopedics;  Laterality: Left;   TOTAL KNEE ARTHROPLASTY Left 08/22/2016   Procedure: TOTAL KNEE ARTHROPLASTY;  Surgeon: Meredith Pel, MD;  Location: [facility];  Service: Orthopedics;  Laterality: Left;   TUBAL LIGATION  1989   Social History   Socioeconomic History   Marital status: Divorced    Spouse name: Not on file   Number of children: 3   Years of education: Not on file   Highest education level: Not on file  Occupational History   Occupation: UNEMPLOYED    Employer: DISABLED  Tobacco Use   Smoking status: Former    Packs/day: 0.50    Years: 15.00    Total pack years: 7.50    Types: Cigarettes    Quit date: 04/24/1985    Years since quitting: 36.6   Smokeless tobacco: Never  Vaping Use   Vaping Use: Never used  Substance and Sexual Activity   Alcohol use: No    Alcohol/week: 0.0 standard drinks of alcohol   Drug use: Not Currently    Types: Marijuana   Sexual activity: Not Currently    Birth control/protection: Surgical    Comment: hyst  Other Topics Concern   Not on file  Social History Narrative   Lives in [place] by herself.  Disabled.  Caffeine 1 cup avg daily.  3 kids, 6 grandkids.     Water aerobics 3 times a week.   Social Determinants of Health   Financial Resource Strain: Medium Risk (11/02/2021)   Overall Financial Resource Strain (CARDIA)    Difficulty of Paying Living Expenses: Somewhat hard  Food Insecurity: Unknown (11/02/2021)   Hunger Vital Sign    Worried About Running Out of Food in the Last Year: Not on file    Ran Out of Food in the Last Year: Never true  Transportation Needs: No Transportation Needs (11/02/2021)   PRAPARE - Hydrologist (Medical): No     Lack of Transportation (Non-Medical): No  Physical Activity: Not on file  Stress: Not on file  Social Connections: Not on file  Intimate Partner Violence: Not on file   Current Outpatient Medications on  File Prior to Visit  Medication Sig Dispense Refill   ACCU-CHEK GUIDE test strip      albuterol (PROVENTIL HFA;VENTOLIN HFA) 108 (90 Base) MCG/ACT inhaler Inhale 2 puffs into the lungs every 6 (six) hours as needed for wheezing or shortness of breath.     aspirin 81 MG chewable tablet Chew 81 mg by mouth daily.     atorvastatin (LIPITOR) 40 MG tablet Take 40 mg by mouth at bedtime.      busPIRone (BUSPAR) 30 MG tablet Take 30 mg by mouth 2 (two) times daily.     carvedilol (COREG) 6.25 MG tablet Take 1 tablet (6.25 mg total) by mouth 2 (two) times daily. 180 tablet 3   diclofenac Sodium (VOLTAREN) 1 % GEL Apply 2 g topically 4 (four) times daily as needed (joint/muscle pain).     EPINEPHrine 0.3 mg/0.3 mL IJ SOAJ injection Inject 0.3 mg into the muscle as needed for anaphylaxis. Follow package instructions as needed for severe allergy or anaphylactic reaction. 2 each 3   furosemide (LASIX) 40 MG tablet Take 60 mg by mouth daily.     hydrOXYzine (VISTARIL) 25 MG capsule Take 25 mg by mouth 3 (three) times daily as needed for itching.     isosorbide dinitrate (ISORDIL) 20 MG tablet Take 20 mg by mouth daily.     lidocaine (LIDODERM) 5 % Place 1 patch onto the skin daily as needed for pain.     lisinopril (PRINIVIL,ZESTRIL) 20 MG tablet Take 20 mg by mouth daily.      loperamide (IMODIUM A-D) 2 MG tablet Take 4 mg by mouth 4 (four) times daily as needed for diarrhea or loose stools.      meloxicam (MOBIC) 15 MG tablet meloxicam 15 mg tablet  TAKE 1 TABLET (15 MG) BY ORAL ROUTE ONCE DAILY FOR 90 DAYS     metFORMIN (GLUCOPHAGE) 500 MG tablet Take 1 tablet (500 mg total) by mouth daily with breakfast. 30 tablet 0   MYRBETRIQ 25 MG TB24 tablet Take 1 tablet (25 mg total) by mouth daily. 30 tablet 0    nystatin (MYCOSTATIN/NYSTOP) powder Apply 1 application topically 3 (three) times daily as needed for rash.     oxyCODONE (OXY IR/ROXICODONE) 5 MG immediate release tablet Take 1 tablet (5 mg total) by mouth every 6 (six) hours as needed for severe pain. 20 tablet 0   pantoprazole (PROTONIX) 40 MG tablet Take 1 tablet (40 mg total) by mouth 2 (two) times daily. 60 tablet 0   potassium chloride (KLOR-CON M10) 10 MEQ tablet Take 1 tablet (10 mEq total) by mouth 2 (two) times daily. 60 tablet 1   Semaglutide,0.25 or 0.'5MG'$ /DOS, (OZEMPIC, 0.25 OR 0.5 MG/DOSE,) 2 MG/1.5ML SOPN Inject 0.5 mg into the skin once a week. 3 mL 0   sertraline (ZOLOFT) 25 MG tablet Take 1 tablet (25 mg total) by mouth daily. 90 tablet 0   Vitamin D, Ergocalciferol, (DRISDOL) 1.25 MG (50000 UNIT) CAPS capsule Take 1 capsule (50,000 Units total) by mouth every 7 (seven) days. 4 capsule 0   No current facility-administered medications on file prior to visit.   Allergies  Allergen Reactions   Hydrocodone-Acetaminophen Itching and Other (See Comments)    confusion   Penicillins Swelling and Other (See Comments)    Has patient had a PCN reaction causing immediate rash, facial/tongue/throat swelling, SOB or lightheadedness with hypotension: YES Has patient had a PCN reaction causing severe rash involving mucus membranes or skin necrosis: NO Has patient  had a PCN reaction that required hospitalization NO Has patient had a PCN reaction occurring within the last 10 years: NO If all of the above answers are "NO", then may proceed with Cephalosporin use.   Shellfish Allergy Hives   Latex Rash   Sulfa Antibiotics Diarrhea, Itching and Rash   Family History  Problem Relation Age of Onset   Emphysema Mother    Arthritis Mother    Heart failure Mother        alive @ 16   Stroke Mother    Diabetes Mother    Hypertension Mother    Hyperlipidemia Mother    Depression Mother    Anxiety disorder Mother    Heart disease Mother     Alcoholism Mother    Depression Father    Heart disease Father        died @ 22's.   Stroke Father    Diabetes Father    Hyperlipidemia Father    Hypertension Father    Bipolar disorder Father    Sleep apnea Father    Alcoholism Father    Drug abuse Father    Sudden death Father    Diabetes Sister    Asthma Brother    Breast cancer Maternal Aunt 78 - 40   Colon cancer Maternal Grandfather        dx after 70   Heart disease Paternal Grandfather    Breast cancer Cousin        dx 19s   Esophageal cancer Neg Hx    Stomach cancer Neg Hx    Rectal cancer Neg Hx    PE: BP 124/70 (BP Location: Left Arm, Patient Position: Sitting, Cuff Size: Large)   Pulse 74   Ht '4\' 11"'$  (1.499 m)   Wt 202 lb (91.6 kg)   SpO2 95%   BMI 40.80 kg/m  Wt Readings from Last 3 Encounters:  11/28/21 202 lb (91.6 kg)  11/21/21 198 lb (89.8 kg)  11/16/21 198 lb 13.7 oz (90.2 kg)   Constitutional: overweight, in NAD Eyes: EOMI, no exophthalmos ENT: moist mucous membranes, + R thyromegaly, no cervical lymphadenopathy Cardiovascular: RRR, No MRG Respiratory: CTA B Musculoskeletal: no deformities Skin: moist, warm, no rashes Neurological: no tremor with outstretched hands  ASSESSMENT: 1. Thyroid nodules  PLAN: 1. Thyroid nodules -Patient with a history of multiple thyroid nodules, which the dominant nodule was found in the right gland and extending towards mediastinum.  This measured initially 3.8 cm, but then involuted to 3.1 cm.  We biopsied this nodule last year and the results were benign. -She also has several smaller nodules which appear to be low risk and 1 right mid thyroid nodule that has several benign features: - Isoechoic  -Without microcalcifications -Without internal blood flow -More wide than tall -While the limited from surrounding tissue Also, patient does not have a thyroid cancer family history or personal history of radiation therapy to head or neck, all of these favoring  benignity. -At today's visit, we discussed about keeping an eye on her thyroid nodules, until 5 years of stability or involution is documented -For now, I did not suggest a repeat her ultrasound, but I would like to see her back in a year and we will repeat it then -No intervention is needed for now -I reviewed her TFTs and they were normal at last check-03/2021 - I will see her back in 1 year  Philemon Kingdom, MD PhD [facility]

## 2021-11-29 ENCOUNTER — Emergency Department (HOSPITAL_COMMUNITY)
Admission: EM | Admit: 2021-11-29 | Discharge: 2021-11-30 | Disposition: A | Discharge status: Home / Self Care | Payer: Medicare Other | Attending: Emergency Medicine | Admitting: Emergency Medicine

## 2021-11-29 ENCOUNTER — Emergency Department (HOSPITAL_COMMUNITY): Payer: Medicare Other

## 2021-11-29 ENCOUNTER — Ambulatory Visit
Admission: EM | Admit: 2021-11-29 | Discharge: 2021-11-29 | Disposition: A | Discharge status: Other Acute Inpatient Hospital | Payer: Medicare Other | Attending: Physician Assistant | Admitting: Physician Assistant

## 2021-11-29 ENCOUNTER — Encounter: Payer: Self-pay | Admitting: Emergency Medicine

## 2021-11-29 ENCOUNTER — Encounter (HOSPITAL_COMMUNITY): Payer: Self-pay

## 2021-11-29 ENCOUNTER — Other Ambulatory Visit: Payer: Self-pay

## 2021-11-29 DIAGNOSIS — Z79899 Other long term (current) drug therapy: Secondary | ICD-10-CM | POA: Insufficient documentation

## 2021-11-29 DIAGNOSIS — R509 Fever, unspecified: Secondary | ICD-10-CM

## 2021-11-29 DIAGNOSIS — J45909 Unspecified asthma, uncomplicated: Secondary | ICD-10-CM | POA: Diagnosis not present

## 2021-11-29 DIAGNOSIS — E119 Type 2 diabetes mellitus without complications: Secondary | ICD-10-CM | POA: Insufficient documentation

## 2021-11-29 DIAGNOSIS — I509 Heart failure, unspecified: Secondary | ICD-10-CM | POA: Insufficient documentation

## 2021-11-29 DIAGNOSIS — Z7984 Long term (current) use of oral hypoglycemic drugs: Secondary | ICD-10-CM | POA: Diagnosis not present

## 2021-11-29 DIAGNOSIS — Z85830 Personal history of malignant neoplasm of bone: Secondary | ICD-10-CM | POA: Diagnosis not present

## 2021-11-29 DIAGNOSIS — L02411 Cutaneous abscess of right axilla: Secondary | ICD-10-CM | POA: Insufficient documentation

## 2021-11-29 DIAGNOSIS — R1031 Right lower quadrant pain: Secondary | ICD-10-CM | POA: Diagnosis not present

## 2021-11-29 DIAGNOSIS — Z20822 Contact with and (suspected) exposure to covid-19: Secondary | ICD-10-CM | POA: Insufficient documentation

## 2021-11-29 DIAGNOSIS — T8149XA Infection following a procedure, other surgical site, initial encounter: Secondary | ICD-10-CM

## 2021-11-29 DIAGNOSIS — Z853 Personal history of malignant neoplasm of breast: Secondary | ICD-10-CM | POA: Insufficient documentation

## 2021-11-29 DIAGNOSIS — Z7982 Long term (current) use of aspirin: Secondary | ICD-10-CM | POA: Insufficient documentation

## 2021-11-29 DIAGNOSIS — I110 Hypertensive heart disease with heart failure: Secondary | ICD-10-CM | POA: Insufficient documentation

## 2021-11-29 LAB — URINALYSIS, ROUTINE W REFLEX MICROSCOPIC
Bilirubin Urine: NEGATIVE
Glucose, UA: NEGATIVE mg/dL
Hgb urine dipstick: NEGATIVE
Ketones, ur: NEGATIVE mg/dL
Leukocytes,Ua: NEGATIVE
Nitrite: NEGATIVE
Protein, ur: NEGATIVE mg/dL
Specific Gravity, Urine: 1.017 (ref 1.005–1.030)
pH: 5 (ref 5.0–8.0)

## 2021-11-29 LAB — COMPREHENSIVE METABOLIC PANEL
ALT: 34 U/L (ref 0–44)
AST: 26 U/L (ref 15–41)
Albumin: 3.6 g/dL (ref 3.5–5.0)
Alkaline Phosphatase: 58 U/L (ref 38–126)
Anion gap: 9 (ref 5–15)
BUN: 19 mg/dL (ref 8–23)
CO2: 25 mmol/L (ref 22–32)
Calcium: 8.9 mg/dL (ref 8.9–10.3)
Chloride: 104 mmol/L (ref 98–111)
Creatinine, Ser: 1.14 mg/dL — ABNORMAL HIGH (ref 0.44–1.00)
GFR, Estimated: 54 mL/min — ABNORMAL LOW (ref >60)
Glucose, Bld: 127 mg/dL — ABNORMAL HIGH (ref 70–99)
Potassium: 3.4 mmol/L — ABNORMAL LOW (ref 3.5–5.1)
Sodium: 138 mmol/L (ref 135–145)
Total Bilirubin: 1.3 mg/dL — ABNORMAL HIGH (ref 0.3–1.2)
Total Protein: 7 g/dL (ref 6.5–8.1)

## 2021-11-29 LAB — RESP PANEL BY RT-PCR (FLU A&B, COVID) ARPGX2
Influenza A by PCR: NEGATIVE
Influenza B by PCR: NEGATIVE
SARS Coronavirus 2 by RT PCR: NEGATIVE

## 2021-11-29 LAB — CBC WITH DIFFERENTIAL/PLATELET
Abs Immature Granulocytes: 0.05 10*3/uL (ref 0.00–0.07)
Basophils Absolute: 0.1 10*3/uL (ref 0.0–0.1)
Basophils Relative: 0 %
Eosinophils Absolute: 0.1 10*3/uL (ref 0.0–0.5)
Eosinophils Relative: 1 %
HCT: 38.2 % (ref 36.0–46.0)
Hemoglobin: 12.4 g/dL (ref 12.0–15.0)
Immature Granulocytes: 0 %
Lymphocytes Relative: 18 %
Lymphs Abs: 2.6 10*3/uL (ref 0.7–4.0)
MCH: 28.9 pg (ref 26.0–34.0)
MCHC: 32.5 g/dL (ref 30.0–36.0)
MCV: 89 fL (ref 80.0–100.0)
Monocytes Absolute: 1.3 10*3/uL — ABNORMAL HIGH (ref 0.1–1.0)
Monocytes Relative: 9 %
Neutro Abs: 10 10*3/uL — ABNORMAL HIGH (ref 1.7–7.7)
Neutrophils Relative %: 72 %
Platelets: 214 10*3/uL (ref 150–400)
RBC: 4.29 MIL/uL (ref 3.87–5.11)
RDW: 13.6 % (ref 11.5–15.5)
WBC: 14 10*3/uL — ABNORMAL HIGH (ref 4.0–10.5)
nRBC: 0 % (ref 0.0–0.2)

## 2021-11-29 LAB — APTT: aPTT: 24 seconds (ref 24–36)

## 2021-11-29 LAB — PROTIME-INR
INR: 1.1 (ref 0.8–1.2)
Prothrombin Time: 14.4 seconds (ref 11.4–15.2)

## 2021-11-29 LAB — LACTIC ACID, PLASMA: Lactic Acid, Venous: 1 mmol/L (ref 0.5–1.9)

## 2021-11-29 MED ORDER — IOHEXOL 350 MG/ML SOLN
100.0000 mL | Freq: Once | INTRAVENOUS | Status: AC | PRN
  Administered 2021-11-29: 100 mL via INTRAVENOUS

## 2021-11-29 MED ORDER — SODIUM CHLORIDE 0.9 % IV SOLN
2.0000 g | Freq: Once | INTRAVENOUS | Status: AC
  Administered 2021-11-29: 2 g via INTRAVENOUS
  Filled 2021-11-29: qty 12.5

## 2021-11-29 MED ORDER — SODIUM CHLORIDE 0.9 % IV SOLN: 2.0000 g | Freq: Once | INTRAVENOUS | Status: DC

## 2021-11-29 MED ORDER — VANCOMYCIN HCL 1750 MG/350ML IV SOLN
1750.0000 mg | Freq: Once | INTRAVENOUS | Status: AC
  Administered 2021-11-29: 1750 mg via INTRAVENOUS
  Filled 2021-11-29: qty 350

## 2021-11-29 MED ORDER — LACTATED RINGERS IV SOLN: INTRAVENOUS | Status: DC

## 2021-11-29 MED ORDER — LACTATED RINGERS IV BOLUS (SEPSIS)
1000.0000 mL | Freq: Once | INTRAVENOUS | Status: AC
  Administered 2021-11-29: 1000 mL via INTRAVENOUS

## 2021-11-29 MED ORDER — VANCOMYCIN HCL IN DEXTROSE 1-5 GM/200ML-% IV SOLN: 1000.0000 mg | Freq: Once | INTRAVENOUS | Status: DC

## 2021-11-29 MED ORDER — ACETAMINOPHEN 325 MG PO TABS
650.0000 mg | ORAL_TABLET | Freq: Once | ORAL | Status: AC
  Administered 2021-11-29: 650 mg via ORAL

## 2021-11-29 NOTE — Sepsis Progress Note (Signed)
Following for sepsis monitoring ?

## 2021-11-29 NOTE — Discharge Instructions (Signed)
Today you were seen in the emergency department for your fever.    In the emergency department you had a CT scan of your chest, abdomen, and pelvis which did show a fluid collection in your armpit.  At home, please take the antibiotics we have prescribed you in case of infection in that area.  Please follow-up with your surgeon to see if more fluid needs to be drained.  Follow-up with your primary doctor in 2-3 days regarding your visit. Please also follow-up with your surgeons as soon as possible regarding your symptoms.   Return immediately to the emergency department if you experience any of the following: high fevers, worsening pain or swelling, or any other concerning symptoms.   Thank you for visiting our Emergency Department. It was a pleasure taking care of you today.

## 2021-11-29 NOTE — Progress Notes (Signed)
Pharmacy Note   A consult was received from an ED physician for vancomycin and aztreonam per pharmacy dosing.  Per protocol, can adjust aztreonam if pt has had recent use of cephalosporins. Pt with cefazolin on 11/16/21. Therefore, will change order to cefepime     The patient's profile has been reviewed for ht/wt/allergies/indication/available labs.    A one time order has been placed for cefepime 2 gr IV x1 and vancomycin 1750 mg IV x1 .    Further antibiotics/pharmacy consults should be ordered by admitting physician if indicated.                       Thank you,  Royetta Asal, PharmD, BCPS 11/29/2021 7:06 PM

## 2021-11-29 NOTE — ED Provider Notes (Signed)
[facility] DEPT Provider Note   CSN: [id number] Arrival date & time: 11/29/21  1824     History {Add pertinent medical, surgical, social history, OB history to HPI:1} Chief Complaint  Patient presents with   Fever   Breast Pain    JOURDIN GENS is a 61 y.o. female.  61 yo F with hx of breast cancer sp resection,   L back pain + frequency, R groin pain   Fever  Past Medical History:  Diagnosis Date   Allergy    Anginal pain ([place])    a. NL cath in 2008;  b. Myoview 03/2011: dec uptake along mid anterior wall on stress imaging -> ? attenuation vs. ischemia, EF 65%;  c. 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 benign thymoma   Neuromuscular disorder ([place])    Obesity    Palpitations    Pneumonia 05/2016   double   Pulmonary edema    Pulmonary embolism ([place])    a. 2008 -> coumadin x 6 mos.   Rheumatoid arthritis ([place])    Sleep apnea    on CPAP 02/2018   SOB (shortness of breath)    Thyroid disease    TIA (transient ischemic attack)    Urinary urgency    Vitamin D deficiency       Home Medications Prior to Admission medications   Medication Sig Start Date End Date Taking? Authorizing Provider  ACCU-CHEK GUIDE test strip  10/28/21   [provider]  albuterol (PROVENTIL HFA;VENTOLIN HFA) 108 (90 Base) MCG/ACT inhaler Inhale 2 puffs into the lungs every 6 (six) hours as needed for wheezing or  shortness of breath.    [provider]  aspirin 81 MG chewable tablet Chew 81 mg by mouth daily.    [provider]  atorvastatin (LIPITOR) 40 MG tablet Take 40 mg by mouth at bedtime.     [provider]  busPIRone (BUSPAR) 30 MG tablet Take 30 mg by mouth 2 (two) times daily. 12/18/19   [provider]  carvedilol (COREG) 6.25 MG tablet Take 1 tablet (6.25 mg total) by mouth 2 (two) times daily. 09/06/21   Josue Hector, MD  diclofenac Sodium (VOLTAREN) 1 % GEL Apply 2 g topically 4 (four) times daily as needed (joint/muscle pain). 09/20/19   [provider]  EPINEPHrine 0.3 mg/0.3 mL IJ SOAJ injection Inject 0.3 mg into the muscle as needed for anaphylaxis. Follow package instructions as needed for severe allergy or anaphylactic reaction. 11/06/21   Carrie Mew, MD  furosemide (LASIX) 40 MG tablet Take 60 mg by mouth daily.    Rai, Vernelle Emerald, MD  hydrOXYzine (VISTARIL)  25 MG capsule Take 25 mg by mouth 3 (three) times daily as needed for itching.    [provider]  isosorbide dinitrate (ISORDIL) 20 MG tablet Take 20 mg by mouth daily.    [provider]  lidocaine (LIDODERM) 5 % Place 1 patch onto the skin daily as needed for pain. 02/11/21   [provider]  lisinopril (PRINIVIL,ZESTRIL) 20 MG tablet Take 20 mg by mouth daily.  09/21/14   [provider]  loperamide (IMODIUM A-D) 2 MG tablet Take 4 mg by mouth 4 (four) times daily as needed for diarrhea or loose stools.     [provider]  meloxicam (MOBIC) 15 MG tablet meloxicam 15 mg tablet  TAKE 1 TABLET (15 MG) BY ORAL ROUTE ONCE DAILY FOR 90 DAYS    [provider]  metFORMIN (GLUCOPHAGE) 500 MG tablet Take 1 tablet (500 mg total) by mouth daily with breakfast. 11/21/21   Bowen, Collene Leyden, DO  MYRBETRIQ 25 MG TB24 tablet Take 1 tablet (25 mg total) by mouth daily. 03/13/19   Dennard Nip D, MD  nystatin (MYCOSTATIN/NYSTOP) powder Apply 1  application topically 3 (three) times daily as needed for rash.    [provider]  oxyCODONE (OXY IR/ROXICODONE) 5 MG immediate release tablet Take 1 tablet (5 mg total) by mouth every 6 (six) hours as needed for severe pain. 11/16/21   Donnie Mesa, MD  pantoprazole (PROTONIX) 40 MG tablet Take 1 tablet (40 mg total) by mouth 2 (two) times daily. 03/09/21   Domenic Polite, MD  potassium chloride (KLOR-CON M10) 10 MEQ tablet Take 1 tablet (10 mEq total) by mouth 2 (two) times daily. 05/27/18   Jearld Lesch A, DO  Semaglutide,0.25 or 0.'5MG'$ /DOS, (OZEMPIC, 0.25 OR 0.5 MG/DOSE,) 2 MG/1.5ML SOPN Inject 0.5 mg into the skin once a week. 11/21/21   Bowen, Collene Leyden, DO  sertraline (ZOLOFT) 25 MG tablet Take 1 tablet (25 mg total) by mouth daily. 10/31/21   Laqueta Linden, MD  Vitamin D, Ergocalciferol, (DRISDOL) 1.25 MG (50000 UNIT) CAPS capsule Take 1 capsule (50,000 Units total) by mouth every 7 (seven) days. 11/21/21   Bowen, Collene Leyden, DO      Allergies    Hydrocodone-acetaminophen, Penicillins, Shellfish allergy, Latex, and Sulfa antibiotics    Review of Systems   Review of Systems  Constitutional:  Positive for fever.    Physical Exam Updated Vital Signs BP (!) 101/59   Pulse 89   Temp 100 F (37.8 C) (Oral)   Resp 16   Ht '4\' 11"'$  (1.499 m)   Wt 91.6 kg   SpO2 96%   BMI 40.80 kg/m  Physical Exam  ED Results / Procedures / Treatments   Labs (all labs ordered are listed, but only abnormal results are displayed) Labs Reviewed  RESP PANEL BY RT-PCR (FLU A&B, COVID) ARPGX2  CULTURE, BLOOD (ROUTINE X 2)  CULTURE, BLOOD (ROUTINE X 2)  URINE CULTURE  LACTIC ACID, PLASMA  LACTIC ACID, PLASMA  COMPREHENSIVE METABOLIC PANEL  CBC WITH DIFFERENTIAL/PLATELET  PROTIME-INR  APTT  URINALYSIS, ROUTINE W REFLEX MICROSCOPIC    EKG None  Radiology No results found.  Procedures Procedures  {Document cardiac monitor, telemetry assessment procedure when  appropriate:1}  Medications Ordered in ED Medications  lactated ringers infusion (has no administration in time range)  lactated ringers bolus 1,000 mL (has no administration in time range)    And  lactated ringers bolus 1,000 mL (has no administration in time range)  And  lactated ringers bolus 1,000 mL (has no administration in time range)  vancomycin (VANCOREADY) IVPB 1750 mg/350 mL (has no administration in time range)  ceFEPIme (MAXIPIME) 2 g in sodium chloride 0.9 % 100 mL IVPB (has no administration in time range)    ED Course/ Medical Decision Making/ A&P                           Medical Decision Making  ***  {Document critical care time when appropriate:1} {Document review of labs and clinical decision tools ie heart score, Chads2Vasc2 etc:1}  {Document your independent review of radiology images, and any outside records:1} {Document your discussion with family members, caretakers, and with consultants:1} {Document social determinants of health affecting pt's care:1} {Document your decision making why or why not admission, treatments were needed:1} Final Clinical Impression(s) / ED Diagnoses Final diagnoses:  None    Rx / DC Orders ED Discharge Orders     None

## 2021-11-29 NOTE — Progress Notes (Unsigned)
Chief Complaint:   OBESITY [name] is here to discuss her progress with her obesity treatment plan along with follow-up of her obesity related diagnoses. [name] is on the Category 2 Plan and states she is following her eating plan approximately 75% of the time. [name] states she is doing 0 minutes 0 times per week.  Today's visit was #: 8 Starting weight: 209 lbs Starting date: 06/01/2021 Today's weight: 198 lbs Today's date: 11/21/2021 Total lbs lost to date: 11 Total lbs lost since last in-office visit: 0  Interim History: Wednesday gained 4 pounds in the past 2 weeks, given her a net weight of 11 pounds since her first visit in February.  She was diagnosed with right breast cancer, status post lumpectomy with axillary node biopsy on 11/16/2021.  She is awaiting a follow-up with radiology, oncology, and general surgeon.  She denies cravings or overeating, but feels highly stressed with cancer diagnosis.  No regular exercise.  Subjective:   1. Type 2 diabetes mellitus with hyperglycemia, without long-term current use of insulin (HCC) [name] is on Ozempic 0.5 mg subcu weekly and metformin 500 mg daily.  Last A1c on 09/28/2021 was 6.4, down from 8.4.  She notes some dyspepsia as side effects from Ozempic, and she is on Protonix.  2. Vitamin D deficiency [name]'s last vitamin D level was 39.8 on 09/28/2021.  She is on prescription vitamin D 50,000 units once weekly.  3. Anxiety and depression [name]'s symptoms are improving on sertraline 25 mg daily.  She is undergoing a lot of stress with cancer diagnosis.  She has 1 daughter for support.  4. Allergy to shellfish [name] notes shellfish allergy on her list.  She was seen on 11/06/2021 in the emergency department for allergic reaction, needed steroids, likely contributed to weight gain.  5. Malignant neoplasm of upper-inner quadrant of right female breast, unspecified estrogen receptor status ([place]) [name] is seeing oncology, rad oncology and  general surgery.  She had clear margins and a negative SN biopsy.  She is well awaiting next steps for treatment.  Assessment/Plan:   1. Type 2 diabetes mellitus with hyperglycemia, without long-term current use of insulin (HCC) Jerica will continue her medications, and we will refill Ozempic 0.5 mg once weekly and metformin 500 mg daily for 1 month.  - Semaglutide,0.25 or 0.'5MG'$ /DOS, (OZEMPIC, 0.25 OR 0.5 MG/DOSE,) 2 MG/1.5ML SOPN; Inject 0.5 mg into the skin once a week.  Dispense: 3 mL; Refill: 0 - metFORMIN (GLUCOPHAGE) 500 MG tablet; Take 1 tablet (500 mg total) by mouth daily with breakfast.  Dispense: 30 tablet; Refill: 0  2. Vitamin D deficiency We will refill prescription vitamin D 50,000 units once weekly for 1 month, and we will plan to recheck vitamin D level in September.  - Vitamin D, Ergocalciferol, (DRISDOL) 1.25 MG (50000 UNIT) CAPS capsule; Take 1 capsule (50,000 Units total) by mouth every 7 (seven) days.  Dispense: 4 capsule; Refill: 0  3. Anxiety and depression We discussed ways to reduce stress, such as short walks outdoors, eating healthy, and deep breathing.  We will refill sertraline 25 mg once daily for 1 month.  4. Allergy to shellfish [name] is to avoid shellfish.  She has a EpiPen for as needed use.  5. Malignant neoplasm of upper-inner quadrant of right female breast, unspecified estrogen receptor status ([place]) [name] is to focus on healing, stress reduction, nutrition, and movement (not just weight loss).  6. Obesity, current BMI 40.1 [name] is currently in the action  stage of change. As such, her goal is to continue with weight loss efforts. She has agreed to the Category 2 Plan.   Exercise goals: 5 to 10 minutes of walking daily.  Behavioral modification strategies: increasing vegetables, increasing water intake, keeping healthy foods in the home, better snacking choices, and decreasing junk food.  [name] has agreed to follow-up with our clinic in 3  weeks. She was informed of the importance of frequent follow-up visits to maximize her success with intensive lifestyle modifications for her multiple health conditions.   Objective:   Blood pressure 134/83, pulse 74, temperature 98.5 F (36.9 C), height '4\' 11"'$  (1.499 m), weight 198 lb (89.8 kg), SpO2 99 %. Body mass index is 39.99 kg/m.  General: Cooperative, alert, well developed, in no acute distress. HEENT: Conjunctivae and lids unremarkable. Cardiovascular: Regular rhythm.  Lungs: Normal work of breathing. Neurologic: No focal deficits.   Lab Results  Component Value Date   CREATININE 1.07 (H) 11/02/2021   BUN 16 11/02/2021   NA 142 11/02/2021   K 3.8 11/02/2021   CL 107 11/02/2021   CO2 30 11/02/2021   Lab Results  Component Value Date   ALT 18 11/02/2021   AST 13 (L) 11/02/2021   ALKPHOS 52 11/02/2021   BILITOT 0.3 11/02/2021   Lab Results  Component Value Date   HGBA1C 6.4 (H) 09/28/2021   HGBA1C 8.4 (H) 03/08/2021   HGBA1C 7.6 (H) 02/03/2020   HGBA1C 7.5 (H) 10/07/2019   HGBA1C 6.3 (H) 12/26/2018   Lab Results  Component Value Date   INSULIN 22.1 09/28/2021   INSULIN 26.8 (H) 06/01/2021   INSULIN 44.4 (H) 02/03/2020   INSULIN 28.1 (H) 12/26/2018   INSULIN 20.2 03/04/2018   Lab Results  Component Value Date   TSH 0.506 04/23/2021   Lab Results  Component Value Date   CHOL 127 09/28/2021   HDL 48 09/28/2021   LDLCALC 60 09/28/2021   TRIG 99 09/28/2021   CHOLHDL 4.2 04/23/2011   CHOLHDL 4.3 04/23/2011   Lab Results  Component Value Date   VD25OH 39.8 09/28/2021   VD25OH 30.0 06/01/2021   VD25OH 44.6 02/03/2020   Lab Results  Component Value Date   WBC 5.3 11/02/2021   HGB 13.0 11/02/2021   HCT 39.6 11/02/2021   MCV 87.8 11/02/2021   PLT 222 11/02/2021   No results found for: "IRON", "TIBC", "FERRITIN"  Attestation Statements:   Reviewed by clinician on day of visit: allergies, medications, problem list, medical history, surgical  history, family history, social history, and previous encounter notes.  Wilhemena Durie, am acting as transcriptionist for Loyal Gambler, DO.  I have reviewed the above documentation for accuracy and completeness, and I agree with the above. Dell Ponto, DO

## 2021-11-29 NOTE — ED Notes (Signed)
Patient is being discharged from the Urgent Care and sent to the Emergency Department via EMS . Per RM, patient is in need of higher level of care due to fever/hypotension. Patient is aware and verbalizes understanding of plan of care.  Vitals:   11/29/21 1729  BP: 105/70  Pulse: 96  Resp: 20  Temp: (!) 101.4 F (38.6 C)  SpO2: 93%

## 2021-11-29 NOTE — ED Provider Triage Note (Signed)
Emergency Medicine Provider Triage Evaluation Note  [name] , a 61 y.o. female  was evaluated in triage.  Pt complains of fever. Pt had recent lumpectomy  Review of Systems  Positive: fever Negative: cough  Physical Exam  BP (!) 101/59   Pulse 89   Temp 100 F (37.8 C) (Oral)   Resp 16   Ht '4\' 11"'$  (1.499 m)   Wt 91.6 kg   SpO2 96%   BMI 40.80 kg/m  Gen:   Awake, no distress   Resp:  Normal effort  MSK:   Moves extremities without difficulty  Other:    Medical Decision Making  Medically screening exam initiated at 7:00 PM.  Appropriate orders placed.  [name] was informed that the remainder of the evaluation will be completed by another provider, this initial triage assessment does not replace that evaluation, and the importance of remaining in the ED until their evaluation is complete.     Fransico [name], [place] 11/29/21 1901

## 2021-11-29 NOTE — ED Triage Notes (Signed)
Pt here for body aches and fever; pt sts had recent breast lumpectomy; pt was seen for follow up from surgery today

## 2021-11-29 NOTE — ED Triage Notes (Signed)
Per EMS- Patient had a right breast lumpectomy on 7/26. On 11/25/21 patient had 70cc of fluid drained from the right breast and today at the UC she had 80 cc drawn off today. Fever was 101.4 today.

## 2021-11-29 NOTE — ED Provider Notes (Signed)
[facility]    CSN: [id number] Arrival date & time: 11/29/21  1707      History   Chief Complaint Chief Complaint  Patient presents with   Generalized Body Aches   Fever    HPI [name] is a 61 y.o. female.   Patient here today for evaluation of body aches and fever that started last night. She did have some urinary frequency. She also had recent lumpectomy. She reports overall she does not feel well.   The history is provided by the patient.  Fever Associated symptoms: chills   Associated symptoms: no congestion, no nausea and no vomiting     Past Medical History:  Diagnosis Date   Allergy    Anginal pain ([place])    a. NL cath in 2008;  b. Myoview 03/2011: dec uptake along mid anterior wall on stress imaging -> ? attenuation vs. ischemia, EF 65%;  c. 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 benign thymoma   Neuromuscular disorder ([place])    Obesity    Palpitations    Pneumonia 05/2016   double   Pulmonary edema    Pulmonary embolism ([place])    a. 2008 -> coumadin x 6 mos.   Rheumatoid arthritis ([place])    Sleep apnea    on CPAP 02/2018   SOB (shortness of breath)    Thyroid disease    TIA (transient ischemic attack)    Urinary urgency    Vitamin D deficiency     Patient Active Problem List   Diagnosis Date Noted   Multiple thyroid nodules 11/28/2021   Genetic testing 11/22/2021   Family history of breast  cancer 11/03/2021   Family history of colon cancer 11/03/2021   Malignant neoplasm of upper-outer quadrant of female breast ([place]) 11/02/2021   Back pain 06/01/2021   Right thyroid nodule 05/31/2021   At risk for impaired metabolic function 40/98/1191   Unilateral primary osteoarthritis, left hip    Hip arthritis 06/11/2018   Class 3 severe obesity with serious comorbidity and body mass index (BMI) of 40.0 to 44.9 in adult ([place]) 05/29/2018   Other fatigue 11/20/2017   Shortness of breath on exertion 11/20/2017   Diabetes mellitus ([place]) 11/20/2017   Vitamin D deficiency 11/20/2017   Depression 11/20/2017   Other hyperlipidemia 11/20/2017   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/04/2016   Presence of left artificial knee joint 09/20/2016   Arthritis of knee 08/22/2016   Cervical radiculopathy 07/26/2016   Cervical disc disorder with radiculopathy 07/26/2016   Right arm pain 06/29/2016   Cervicalgia 06/29/2016   Primary osteoarthritis of left knee 06/29/2016   Hypersomnia with sleep apnea 11/18/2015  Lethargy 11/18/2015   Exertional chest pain 09/30/2014   Abnormal cardiac function test 09/29/2014   Chest pain with moderate risk for cardiac etiology 09/28/2014   HTN (hypertension) 09/28/2014   Hypokalemia    Paresthesia 06/18/2014   Numbness and tingling of left arm and leg    Unstable angina ([place]) 05/24/2014   OSA on CPAP 05/24/2014   CAD (coronary artery disease) 11/25/2012   S/P laparoscopic appendectomy 76/16/0737   Diastolic CHF ([place]) 10/62/6948   Mediastinal mass 01/09/2012   SOB (shortness of breath) 06/20/2011   Mediastinal abnormality 06/20/2011   Dyslipidemia 12/23/2009   GLAUCOMA 12/23/2009   ARTHRITIS 12/23/2009   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   Class 2 severe obesity with serious comorbidity and body mass index (BMI) of 38.0 to 38.9 in adult ([place]) 09/13/2006   ANXIETY STATE NOS 09/13/2006   DISORDER, DEPRESSIVE NEC 09/13/2006   CARPAL TUNNEL SYNDROME, MILD 09/13/2006    Unspecified essential hypertension 09/13/2006   IBS 09/13/2006   DEGENERATION, LUMBAR/LUMBOSACRAL DISC 09/13/2006   SYMPTOM, SWELLING/MASS/LUMP IN CHEST 09/13/2006   PULMONARY EMBOLISM, HX OF 09/13/2006    Past Surgical History:  Procedure Laterality Date   ABDOMINAL HYSTERECTOMY  2005   APPENDECTOMY     BREAST LUMPECTOMY WITH RADIOACTIVE SEED AND SENTINEL LYMPH NODE BIOPSY Right 11/16/2021   Procedure: RIGHT BREAST RADIOACTIVE SEED LOCALIZED LUMPECTOMY WITH RIGHT AXILLARY SENTINEL LYMPH NODE BIOPSY;  Surgeon: Donnie Mesa, MD;  Location: [facility];  Service: General;  Laterality: Right;  LMA & PEC BLOCK   CARDIAC CATHETERIZATION     Normal   CARDIAC CATHETERIZATION N/A 09/30/2014   Procedure: Left Heart Cath and Coronary Angiography;  Surgeon: Sherren Mocha, MD;  Location: [facility];  Service: Cardiovascular;  Laterality: N/A;   LAPAROSCOPIC APPENDECTOMY N/A 06/03/2012   Procedure: APPENDECTOMY LAPAROSCOPIC;  Surgeon: Stark Klein, MD;  Location: [facility];  Service: General;  Laterality: N/A;   Left knee surgery  2008   LEG SURGERY     TONSILLECTOMY     TOTAL HIP ARTHROPLASTY Left 06/11/2018   Procedure: LEFT TOTAL HIP ARTHROPLASTY ANTERIOR APPROACH;  Surgeon: Meredith Pel, MD;  Location: [facility];  Service: Orthopedics;  Laterality: Left;   TOTAL KNEE ARTHROPLASTY Left 08/22/2016   Procedure: TOTAL KNEE ARTHROPLASTY;  Surgeon: Meredith Pel, MD;  Location: [facility];  Service: Orthopedics;  Laterality: Left;   TUBAL LIGATION  1989    OB History     Gravida  4   Para  3   Term  3   Preterm  0   AB  1   Living  3      SAB  1   IAB  0   Ectopic  0   Multiple  0   Live Births  3            Home Medications    Prior to Admission medications   Medication Sig Start Date End Date Taking? Authorizing Provider  ACCU-CHEK GUIDE test strip  10/28/21   [provider]  albuterol (PROVENTIL HFA;VENTOLIN HFA) 108 (90 Base) MCG/ACT  inhaler Inhale 2 puffs into the lungs every 6 (six) hours as needed for wheezing or shortness of breath.    [provider]  aspirin 81 MG chewable tablet Chew 81 mg by mouth daily.    [provider]  atorvastatin (LIPITOR) 40 MG tablet Take 40 mg by mouth at bedtime.     [provider]  busPIRone (BUSPAR) 30 MG tablet  Take 30 mg by mouth 2 (two) times daily. 12/18/19   [provider]  carvedilol (COREG) 6.25 MG tablet Take 1 tablet (6.25 mg total) by mouth 2 (two) times daily. 09/06/21   Josue Hector, MD  diclofenac Sodium (VOLTAREN) 1 % GEL Apply 2 g topically 4 (four) times daily as needed (joint/muscle pain). 09/20/19   [provider]  EPINEPHrine 0.3 mg/0.3 mL IJ SOAJ injection Inject 0.3 mg into the muscle as needed for anaphylaxis. Follow package instructions as needed for severe allergy or anaphylactic reaction. 11/06/21   Carrie Mew, MD  furosemide (LASIX) 40 MG tablet Take 60 mg by mouth daily.    Rai, Vernelle Emerald, MD  hydrOXYzine (VISTARIL) 25 MG capsule Take 25 mg by mouth 3 (three) times daily as needed for itching.    [provider]  isosorbide dinitrate (ISORDIL) 20 MG tablet Take 20 mg by mouth daily.    [provider]  lidocaine (LIDODERM) 5 % Place 1 patch onto the skin daily as needed for pain. 02/11/21   [provider]  lisinopril (PRINIVIL,ZESTRIL) 20 MG tablet Take 20 mg by mouth daily.  09/21/14   [provider]  loperamide (IMODIUM A-D) 2 MG tablet Take 4 mg by mouth 4 (four) times daily as needed for diarrhea or loose stools.     [provider]  meloxicam (MOBIC) 15 MG tablet meloxicam 15 mg tablet  TAKE 1 TABLET (15 MG) BY ORAL ROUTE ONCE DAILY FOR 90 DAYS    [provider]  metFORMIN (GLUCOPHAGE) 500 MG tablet Take 1 tablet (500 mg total) by mouth daily with breakfast. 11/21/21   Bowen, Collene Leyden, DO  MYRBETRIQ 25 MG TB24 tablet Take 1 tablet (25 mg total) by mouth  daily. 03/13/19   Dennard Nip D, MD  nystatin (MYCOSTATIN/NYSTOP) powder Apply 1 application topically 3 (three) times daily as needed for rash.    [provider]  oxyCODONE (OXY IR/ROXICODONE) 5 MG immediate release tablet Take 1 tablet (5 mg total) by mouth every 6 (six) hours as needed for severe pain. 11/16/21   Donnie Mesa, MD  pantoprazole (PROTONIX) 40 MG tablet Take 1 tablet (40 mg total) by mouth 2 (two) times daily. 03/09/21   Domenic Polite, MD  potassium chloride (KLOR-CON M10) 10 MEQ tablet Take 1 tablet (10 mEq total) by mouth 2 (two) times daily. 05/27/18   Jearld Lesch A, DO  Semaglutide,0.25 or 0.'5MG'$ /DOS, (OZEMPIC, 0.25 OR 0.5 MG/DOSE,) 2 MG/1.5ML SOPN Inject 0.5 mg into the skin once a week. 11/21/21   Bowen, Collene Leyden, DO  sertraline (ZOLOFT) 25 MG tablet Take 1 tablet (25 mg total) by mouth daily. 10/31/21   Laqueta Linden, MD  Vitamin D, Ergocalciferol, (DRISDOL) 1.25 MG (50000 UNIT) CAPS capsule Take 1 capsule (50,000 Units total) by mouth every 7 (seven) days. 11/21/21   Bowen, Collene Leyden, DO    Family History Family History  Problem Relation Age of Onset   Emphysema Mother    Arthritis Mother    Heart failure Mother        alive @ 72   Stroke Mother    Diabetes Mother    Hypertension Mother    Hyperlipidemia Mother    Depression Mother    Anxiety disorder Mother    Heart disease Mother    Alcoholism Mother    Depression Father    Heart disease Father        died @ 38's.   Stroke Father  Diabetes Father    Hyperlipidemia Father    Hypertension Father    Bipolar disorder Father    Sleep apnea Father    Alcoholism Father    Drug abuse Father    Sudden death Father    Diabetes Sister    Asthma Brother    Breast cancer Maternal Aunt 58 - 63   Colon cancer Maternal Grandfather        dx after 35   Heart disease Paternal Grandfather    Breast cancer Cousin        dx 34s   Esophageal cancer Neg Hx    Stomach cancer Neg Hx    Rectal cancer  Neg Hx     Social History Social History   Tobacco Use   Smoking status: Former    Packs/day: 0.50    Years: 15.00    Total pack years: 7.50    Types: Cigarettes    Quit date: 04/24/1985    Years since quitting: 36.6   Smokeless tobacco: Never  Vaping Use   Vaping Use: Never used  Substance Use Topics   Alcohol use: No    Alcohol/week: 0.0 standard drinks of alcohol   Drug use: Not Currently    Types: Marijuana     Allergies   Hydrocodone-acetaminophen, Penicillins, Shellfish allergy, Latex, and Sulfa antibiotics   Review of Systems Review of Systems  Constitutional:  Positive for chills and fever.  HENT:  Negative for congestion.   Eyes:  Negative for discharge and redness.  Respiratory:  Negative for shortness of breath.   Gastrointestinal:  Negative for abdominal pain, nausea and vomiting.  Genitourinary:  Positive for frequency, vaginal bleeding and vaginal discharge.     Physical Exam Triage Vital Signs ED Triage Vitals  Enc Vitals Group     BP 11/29/21 1729 105/70     Pulse Rate 11/29/21 1729 96     Resp 11/29/21 1729 20     Temp 11/29/21 1729 (!) 101.4 F (38.6 C)     Temp Source 11/29/21 1729 Oral     SpO2 11/29/21 1729 93 %     Weight --      Height --      Head Circumference --      Peak Flow --      Pain Score 11/29/21 1730 5     Pain Loc --      Pain Edu? --      Excl. in [place]? --    No data found.  Updated Vital Signs BP 105/70 (BP Location: Left Arm)   Pulse 96   Temp (!) 101.4 F (38.6 C) (Oral)   Resp 20   SpO2 93%      Physical Exam Vitals and nursing note reviewed.  Constitutional:      Appearance: She is ill-appearing (appears to not feel well).  HENT:     Head: Normocephalic and atraumatic.  Eyes:     Conjunctiva/sclera: Conjunctivae normal.  Cardiovascular:     Rate and Rhythm: Normal rate.  Pulmonary:     Effort: Pulmonary effort is normal.  Neurological:     Mental Status: She is alert.  Psychiatric:        Mood  and Affect: Mood normal.        Behavior: Behavior normal.        Thought Content: Thought content normal.      UC Treatments / Results  Labs (all labs ordered are listed, but only abnormal results are displayed)  Labs Reviewed - No data to display  EKG   Radiology No results found.  Procedures Procedures (including critical care time)  Medications Ordered in UC Medications  acetaminophen (TYLENOL) tablet 650 mg (650 mg Oral Given 11/29/21 1737)    Initial Impression / Assessment and Plan / UC Course  I have reviewed the triage vital signs and the nursing notes.  Pertinent labs & imaging results that were available during my care of the patient were reviewed by me and considered in my medical decision making (see chart for details).    Recommended further evaluation in the ED for labs, etc given recent surgery. Patient is agreeable and would prefer EMS transport which is reasonable given her generalized weakness.   Final Clinical Impressions(s) / UC Diagnoses   Final diagnoses:  Fever, unspecified   Discharge Instructions   None    ED Prescriptions   None    PDMP not reviewed this encounter.   Francene Finders, PA-C 11/29/21 1839

## 2021-11-30 ENCOUNTER — Encounter (INDEPENDENT_AMBULATORY_CARE_PROVIDER_SITE_OTHER): Payer: Self-pay

## 2021-11-30 ENCOUNTER — Other Ambulatory Visit (INDEPENDENT_AMBULATORY_CARE_PROVIDER_SITE_OTHER): Payer: Self-pay | Admitting: Family Medicine

## 2021-11-30 DIAGNOSIS — R509 Fever, unspecified: Secondary | ICD-10-CM | POA: Diagnosis not present

## 2021-11-30 DIAGNOSIS — F419 Anxiety disorder, unspecified: Secondary | ICD-10-CM

## 2021-11-30 MED ORDER — SERTRALINE HCL 25 MG PO TABS: 25.0000 mg | ORAL_TABLET | Freq: Every day | ORAL | 0 refills | Status: DC

## 2021-11-30 MED ORDER — DOXYCYCLINE HYCLATE 100 MG PO CAPS: 100.0000 mg | ORAL_CAPSULE | Freq: Two times a day (BID) | ORAL | 0 refills | Status: AC

## 2021-11-30 MED ORDER — LOPERAMIDE HCL 2 MG PO CAPS
2.0000 mg | ORAL_CAPSULE | Freq: Once | ORAL | Status: AC
  Administered 2021-11-30: 2 mg via ORAL
  Filled 2021-11-30: qty 1

## 2021-12-01 LAB — URINE CULTURE: Special Requests: NORMAL

## 2021-12-02 ENCOUNTER — Encounter: Payer: Self-pay | Admitting: Hematology and Oncology

## 2021-12-02 ENCOUNTER — Other Ambulatory Visit: Payer: Self-pay

## 2021-12-02 ENCOUNTER — Other Ambulatory Visit: Payer: Medicare Other | Admitting: Licensed Clinical Social Worker

## 2021-12-02 ENCOUNTER — Inpatient Hospital Stay: Payer: Medicare Other | Attending: Hematology and Oncology | Admitting: Hematology and Oncology

## 2021-12-02 DIAGNOSIS — Z882 Allergy status to sulfonamides status: Secondary | ICD-10-CM | POA: Diagnosis not present

## 2021-12-02 DIAGNOSIS — Z87891 Personal history of nicotine dependence: Secondary | ICD-10-CM | POA: Insufficient documentation

## 2021-12-02 DIAGNOSIS — Z9049 Acquired absence of other specified parts of digestive tract: Secondary | ICD-10-CM | POA: Insufficient documentation

## 2021-12-02 DIAGNOSIS — N6312 Unspecified lump in the right breast, upper inner quadrant: Secondary | ICD-10-CM | POA: Insufficient documentation

## 2021-12-02 DIAGNOSIS — Z8249 Family history of ischemic heart disease and other diseases of the circulatory system: Secondary | ICD-10-CM | POA: Insufficient documentation

## 2021-12-02 DIAGNOSIS — Z86711 Personal history of pulmonary embolism: Secondary | ICD-10-CM | POA: Insufficient documentation

## 2021-12-02 DIAGNOSIS — I509 Heart failure, unspecified: Secondary | ICD-10-CM | POA: Insufficient documentation

## 2021-12-02 DIAGNOSIS — Z79899 Other long term (current) drug therapy: Secondary | ICD-10-CM | POA: Diagnosis not present

## 2021-12-02 DIAGNOSIS — I2510 Atherosclerotic heart disease of native coronary artery without angina pectoris: Secondary | ICD-10-CM | POA: Diagnosis not present

## 2021-12-02 DIAGNOSIS — R509 Fever, unspecified: Secondary | ICD-10-CM | POA: Diagnosis not present

## 2021-12-02 DIAGNOSIS — Z880 Allergy status to penicillin: Secondary | ICD-10-CM | POA: Insufficient documentation

## 2021-12-02 DIAGNOSIS — E119 Type 2 diabetes mellitus without complications: Secondary | ICD-10-CM | POA: Diagnosis not present

## 2021-12-02 DIAGNOSIS — C50411 Malignant neoplasm of upper-outer quadrant of right female breast: Secondary | ICD-10-CM | POA: Insufficient documentation

## 2021-12-02 DIAGNOSIS — Z8349 Family history of other endocrine, nutritional and metabolic diseases: Secondary | ICD-10-CM | POA: Diagnosis not present

## 2021-12-02 DIAGNOSIS — Z5986 Financial insecurity: Secondary | ICD-10-CM | POA: Diagnosis not present

## 2021-12-02 DIAGNOSIS — Z803 Family history of malignant neoplasm of breast: Secondary | ICD-10-CM | POA: Insufficient documentation

## 2021-12-02 DIAGNOSIS — Z8261 Family history of arthritis: Secondary | ICD-10-CM | POA: Diagnosis not present

## 2021-12-02 DIAGNOSIS — Z836 Family history of other diseases of the respiratory system: Secondary | ICD-10-CM | POA: Insufficient documentation

## 2021-12-02 DIAGNOSIS — Z8673 Personal history of transient ischemic attack (TIA), and cerebral infarction without residual deficits: Secondary | ICD-10-CM | POA: Diagnosis not present

## 2021-12-02 DIAGNOSIS — Z814 Family history of other substance abuse and dependence: Secondary | ICD-10-CM | POA: Insufficient documentation

## 2021-12-02 DIAGNOSIS — Z171 Estrogen receptor negative status [ER-]: Secondary | ICD-10-CM | POA: Insufficient documentation

## 2021-12-02 DIAGNOSIS — Z885 Allergy status to narcotic agent status: Secondary | ICD-10-CM | POA: Diagnosis not present

## 2021-12-02 DIAGNOSIS — Z823 Family history of stroke: Secondary | ICD-10-CM | POA: Insufficient documentation

## 2021-12-02 DIAGNOSIS — Z833 Family history of diabetes mellitus: Secondary | ICD-10-CM | POA: Insufficient documentation

## 2021-12-02 DIAGNOSIS — I110 Hypertensive heart disease with heart failure: Secondary | ICD-10-CM | POA: Insufficient documentation

## 2021-12-02 DIAGNOSIS — Z800 Family history of malignant neoplasm of digestive organs: Secondary | ICD-10-CM | POA: Insufficient documentation

## 2021-12-02 DIAGNOSIS — Z811 Family history of alcohol abuse and dependence: Secondary | ICD-10-CM | POA: Insufficient documentation

## 2021-12-02 NOTE — Assessment & Plan Note (Addendum)
This is a very pleasant 61 year old female patient with past medical history significant for congestive heart failure, coronary artery disease, diabetes, hypertension, dyslipidemia history of pulmonary embolism back in 2008 for which she needed anticoagulation, rheumatoid arthritis, obstructive sleep apnea who is newly diagnosed with right breast IDC noticed on screening mammogram.  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  She is here postlumpectomy to discuss adjuvant recommendations.  I once again discussed about considering adjuvant docetaxel and cyclophosphamide every 21 days for 4 cycles. She however had some post op fever, went to the ED and started on abx. She is now on doxycyline. She will RTC in 2 weeks to follow up to ensure adequate healing and recovery from infection.

## 2021-12-02 NOTE — Progress Notes (Signed)
[facility] CONSULT NOTE  Patient Care Team: Smith, Fred A Jr., FNP as PCP - General (Family Medicine) Nishan, Peter C, MD as PCP - Cardiology (Cardiology) Stuart, Dawn C, RN as Oncology Nurse Navigator Martini, Keisha N, RN as Oncology Nurse Navigator Tsuei, Matthew, MD as Consulting Physician (General Surgery) Therasa Lorenzi, MD as Consulting Physician (Hematology and Oncology) Moody, John, MD as Consulting Physician (Radiation Oncology)  CHIEF COMPLAINTS/PURPOSE OF CONSULTATION:  Newly diagnosed breast cancer  HISTORY OF PRESENTING ILLNESS:  [name] 61 y.o. female is here because of recent diagnosis of right breast IDC  I reviewed her records extensively and collaborated the history with the patient.  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 Ultrasound of the right axilla is negative.   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  Report date is November 21, 2021.   The CustomNext-Cancer+RNAinsight panel offered by [facility] includes sequencing and rearrangement analysis for the following 47 genes:  APC, ATM, AXIN2, BARD1, BMPR1A, BRCA1, BRCA2, BRIP1, CDH1, CDK4, CDKN2A, CHEK2, DICER1, EPCAM, GREM1, HOXB13, MEN1, MLH1, MSH2, MSH3, MSH6, MUTYH, NBN, NF1, NF2, NTHL1, PALB2, PMS2, POLD1, POLE, PTEN, RAD51C, RAD51D, RECQL, RET,  SDHA, SDHAF2, SDHB, SDHC, SDHD, SMAD4, SMARCA4, STK11, TP53, TSC1, TSC2, and VHL.  RNA data is routinely analyzed for use in variant interpretation for all genes.   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  Prior prognostic showed ER 0% negative PR 0% negative, HER2 low with IHC of 1+ and Ki-67 of 25%.    Interval History  She is here for a follow up after surgery. She is here with her daughter.  She appears a little tired and spaced out, she tells me that she did not get much sleep.  She is also on doxycycline for antibiotics, she had a fever after draining her postop seroma. She otherwise denies any complaints.  DM is well controlled, Hb A1c is 6.4%. ECHO 50-55%. Nov 2022. History of PE in 2008, took anticoagulation.  MEDICAL HISTORY:  Past Medical History:  Diagnosis Date   Allergy    Anginal pain (HCC)    a. NL cath in 2008;  b. Myoview 03/2011: dec uptake along mid anterior wall on stress imaging -> ? attenuation vs. ischemia, EF 65%;  c. 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 2008 -> coumadin x 6 mos.   Rheumatoid arthritis (HCC)    Sleep apnea    on  CPAP 02/2018   SOB (shortness of breath)    Thyroid disease    TIA (transient ischemic attack)    Urinary urgency    Vitamin D deficiency     SURGICAL HISTORY: Past Surgical History:  Procedure Laterality Date   ABDOMINAL HYSTERECTOMY  2005   APPENDECTOMY     BREAST LUMPECTOMY WITH RADIOACTIVE SEED AND SENTINEL LYMPH NODE BIOPSY Right 11/16/2021   Procedure: RIGHT BREAST RADIOACTIVE SEED LOCALIZED LUMPECTOMY WITH RIGHT AXILLARY SENTINEL LYMPH NODE BIOPSY;  Surgeon: Tsuei, Matthew, MD;  Location: [facility];  Service: General;  Laterality: Right;  LMA & PEC BLOCK   CARDIAC CATHETERIZATION     Normal   CARDIAC CATHETERIZATION N/A 09/30/2014   Procedure: Left Heart Cath and Coronary Angiography;  Surgeon: Michael Cooper, MD;  Location: [facility];  Service: Cardiovascular;  Laterality: N/A;   LAPAROSCOPIC APPENDECTOMY N/A 06/03/2012   Procedure: APPENDECTOMY LAPAROSCOPIC;  Surgeon: Faera Byerly, MD;  Location: [facility] OR;  Service: General;  Laterality: N/A;   Left knee surgery  2008   LEG SURGERY     TONSILLECTOMY     TOTAL HIP ARTHROPLASTY Left 06/11/2018   Procedure: LEFT TOTAL HIP ARTHROPLASTY ANTERIOR APPROACH;  Surgeon: Dean, Gregory Scott, MD;  Location: [facility] OR;  Service: Orthopedics;  Laterality: Left;   TOTAL KNEE ARTHROPLASTY Left 08/22/2016   Procedure: TOTAL KNEE ARTHROPLASTY;  Surgeon: Dean, Scott Gregory, MD;  Location: [facility] OR;  Service: Orthopedics;  Laterality: Left;   TUBAL LIGATION  1989    SOCIAL HISTORY: Social History   Socioeconomic History   Marital status: Divorced    Spouse name: Not on file   Number of children: 3   Years of education: Not on file   Highest education level: Not on file  Occupational History   Occupation: UNEMPLOYED    Employer: DISABLED  Tobacco Use   Smoking status: Former    Packs/day: 0.50    Years: 15.00    Total pack years: 7.50    Types: Cigarettes    Quit date: 04/24/1985    Years since quitting: 36.6   Smokeless  tobacco: Never  Vaping Use   Vaping Use: Never used  Substance and Sexual Activity   Alcohol use: No    Alcohol/week: 0.0 standard drinks of alcohol   Drug use: Not Currently    Types: Marijuana   Sexual activity: Not Currently    Birth control/protection: Surgical    Comment: hyst  Other Topics Concern   Not on file  Social History Narrative   Lives in [place] by herself.  Disabled.  Caffeine 1 cup avg daily.  3 kids, 6 grandkids.     Water aerobics 3 times a week.   Social Determinants of Health   Financial Resource Strain: Medium Risk (11/02/2021)     Overall Financial Resource Strain (CARDIA)    Difficulty of Paying Living Expenses: Somewhat hard  Food Insecurity: Unknown (11/02/2021)   Hunger Vital Sign    Worried About Running Out of Food in the Last Year: Not on file    Ran Out of Food in the Last Year: Never true  Transportation Needs: No Transportation Needs (11/02/2021)   PRAPARE - Transportation    Lack of Transportation (Medical): No    Lack of Transportation (Non-Medical): No  Physical Activity: Not on file  Stress: Not on file  Social Connections: Not on file  Intimate Partner Violence: Not on file    FAMILY HISTORY: Family History  Problem Relation Age of Onset   Emphysema Mother    Arthritis Mother    Heart failure Mother        alive @ 74   Stroke Mother    Diabetes Mother    Hypertension Mother    Hyperlipidemia Mother    Depression Mother    Anxiety disorder Mother    Heart disease Mother    Alcoholism Mother    Depression Father    Heart disease Father        died @ 60's.   Stroke Father    Diabetes Father    Hyperlipidemia Father    Hypertension Father    Bipolar disorder Father    Sleep apnea Father    Alcoholism Father    Drug abuse Father    Sudden death Father    Diabetes Sister    Asthma Brother    Breast cancer Maternal Aunt 35 - 40   Colon cancer Maternal Grandfather        dx after 50   Heart disease Paternal Grandfather     Breast cancer Cousin        dx 20s   Esophageal cancer Neg Hx    Stomach cancer Neg Hx    Rectal cancer Neg Hx     ALLERGIES:  is allergic to hydrocodone-acetaminophen, penicillins, shellfish allergy, latex, and sulfa antibiotics.  MEDICATIONS:  Current Outpatient Medications  Medication Sig Dispense Refill   ACCU-CHEK GUIDE test strip      albuterol (PROVENTIL HFA;VENTOLIN HFA) 108 (90 Base) MCG/ACT inhaler Inhale 2 puffs into the lungs every 6 (six) hours as needed for wheezing or shortness of breath.     aspirin 81 MG chewable tablet Chew 81 mg by mouth daily.     atorvastatin (LIPITOR) 40 MG tablet Take 40 mg by mouth at bedtime.      busPIRone (BUSPAR) 30 MG tablet Take 30 mg by mouth 2 (two) times daily.     carvedilol (COREG) 6.25 MG tablet Take 1 tablet (6.25 mg total) by mouth 2 (two) times daily. 180 tablet 3   diclofenac Sodium (VOLTAREN) 1 % GEL Apply 2 g topically 4 (four) times daily as needed (joint/muscle pain).     doxycycline (VIBRAMYCIN) 100 MG capsule Take 1 capsule (100 mg total) by mouth 2 (two) times daily for 7 days. 14 capsule 0   EPINEPHrine 0.3 mg/0.3 mL IJ SOAJ injection Inject 0.3 mg into the muscle as needed for anaphylaxis. Follow package instructions as needed for severe allergy or anaphylactic reaction. 2 each 3   furosemide (LASIX) 40 MG tablet Take 60 mg by mouth daily.     hydrOXYzine (VISTARIL) 25 MG capsule Take 25 mg by mouth 3 (three) times daily as needed for itching.     isosorbide dinitrate (ISORDIL) 20 MG tablet Take 20 mg   by mouth daily.     lidocaine (LIDODERM) 5 % Place 1 patch onto the skin daily as needed for pain.     lisinopril (PRINIVIL,ZESTRIL) 20 MG tablet Take 20 mg by mouth daily.      loperamide (IMODIUM A-D) 2 MG tablet Take 4 mg by mouth 4 (four) times daily as needed for diarrhea or loose stools.      metFORMIN (GLUCOPHAGE) 500 MG tablet Take 1 tablet (500 mg total) by mouth daily with breakfast. 30 tablet 0   MYRBETRIQ 25 MG  TB24 tablet Take 1 tablet (25 mg total) by mouth daily. 30 tablet 0   nystatin (MYCOSTATIN/NYSTOP) powder Apply 1 application topically 3 (three) times daily as needed for rash.     oxyCODONE (OXY IR/ROXICODONE) 5 MG immediate release tablet Take 1 tablet (5 mg total) by mouth every 6 (six) hours as needed for severe pain. 20 tablet 0   pantoprazole (PROTONIX) 40 MG tablet Take 1 tablet (40 mg total) by mouth 2 (two) times daily. 60 tablet 0   potassium chloride (KLOR-CON M10) 10 MEQ tablet Take 1 tablet (10 mEq total) by mouth 2 (two) times daily. 60 tablet 1   Semaglutide,0.25 or 0.5MG/DOS, (OZEMPIC, 0.25 OR 0.5 MG/DOSE,) 2 MG/1.5ML SOPN Inject 0.5 mg into the skin once a week. 3 mL 0   sertraline (ZOLOFT) 25 MG tablet Take 1 tablet (25 mg total) by mouth daily. 30 tablet 0   Vitamin D, Ergocalciferol, (DRISDOL) 1.25 MG (50000 UNIT) CAPS capsule Take 1 capsule (50,000 Units total) by mouth every 7 (seven) days. 4 capsule 0   No current facility-administered medications for this visit.    PHYSICAL EXAMINATION: ECOG PERFORMANCE STATUS: 1 - Symptomatic but completely ambulatory  There were no vitals filed for this visit.  There were no vitals filed for this visit.   GENERAL:alert, no distress and comfortable Right breast scar healing well. Right axillary seroma, dressing in place.  LABORATORY DATA:  I have reviewed the data as listed Lab Results  Component Value Date   WBC 14.0 (H) 11/29/2021   HGB 12.4 11/29/2021   HCT 38.2 11/29/2021   MCV 89.0 11/29/2021   PLT 214 11/29/2021   Lab Results  Component Value Date   NA 138 11/29/2021   K 3.4 (L) 11/29/2021   CL 104 11/29/2021   CO2 25 11/29/2021    RADIOGRAPHIC STUDIES: I have personally reviewed the radiological reports and agreed with the findings in the report.  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  She is here postlumpectomy to discuss adjuvant recommendations.  I once again discussed about considering adjuvant docetaxel and cyclophosphamide every 21 days for 4 cycles. She however had some post op fever, went to the ED and started on abx. She is now on doxycyline. She will RTC in 2 weeks to follow up to ensure adequate healing and recovery from infection.  Total time spent: 30 minutes including history, physical exam, review of records, counseling and coordination of care All questions were answered. The patient knows to call   the clinic with any problems, questions or concerns.    Praveena Iruku, MD 12/02/21 

## 2021-12-04 ENCOUNTER — Other Ambulatory Visit: Payer: Self-pay

## 2021-12-04 ENCOUNTER — Encounter (HOSPITAL_COMMUNITY): Payer: Self-pay

## 2021-12-04 ENCOUNTER — Ambulatory Visit
Admission: EM | Admit: 2021-12-04 | Discharge: 2021-12-04 | Disposition: A | Discharge status: Home / Self Care | Payer: Medicare Other | Attending: Physician Assistant | Admitting: Physician Assistant

## 2021-12-04 ENCOUNTER — Emergency Department (HOSPITAL_COMMUNITY): Payer: Medicare Other

## 2021-12-04 ENCOUNTER — Emergency Department (HOSPITAL_COMMUNITY)
Admission: EM | Admit: 2021-12-04 | Discharge: 2021-12-04 | Disposition: A | Discharge status: Home / Self Care | Payer: Medicare Other | Attending: Emergency Medicine | Admitting: Emergency Medicine

## 2021-12-04 ENCOUNTER — Encounter: Payer: Self-pay | Admitting: Emergency Medicine

## 2021-12-04 DIAGNOSIS — I509 Heart failure, unspecified: Secondary | ICD-10-CM | POA: Insufficient documentation

## 2021-12-04 DIAGNOSIS — R0602 Shortness of breath: Secondary | ICD-10-CM | POA: Insufficient documentation

## 2021-12-04 DIAGNOSIS — Z7984 Long term (current) use of oral hypoglycemic drugs: Secondary | ICD-10-CM | POA: Insufficient documentation

## 2021-12-04 DIAGNOSIS — J45909 Unspecified asthma, uncomplicated: Secondary | ICD-10-CM | POA: Insufficient documentation

## 2021-12-04 DIAGNOSIS — M79652 Pain in left thigh: Secondary | ICD-10-CM | POA: Insufficient documentation

## 2021-12-04 DIAGNOSIS — M545 Low back pain, unspecified: Secondary | ICD-10-CM | POA: Insufficient documentation

## 2021-12-04 DIAGNOSIS — M79651 Pain in right thigh: Secondary | ICD-10-CM | POA: Insufficient documentation

## 2021-12-04 DIAGNOSIS — Z91040 Latex allergy status: Secondary | ICD-10-CM | POA: Diagnosis not present

## 2021-12-04 DIAGNOSIS — R072 Precordial pain: Secondary | ICD-10-CM | POA: Diagnosis not present

## 2021-12-04 DIAGNOSIS — C50411 Malignant neoplasm of upper-outer quadrant of right female breast: Secondary | ICD-10-CM | POA: Diagnosis not present

## 2021-12-04 DIAGNOSIS — I110 Hypertensive heart disease with heart failure: Secondary | ICD-10-CM | POA: Insufficient documentation

## 2021-12-04 DIAGNOSIS — E119 Type 2 diabetes mellitus without complications: Secondary | ICD-10-CM | POA: Diagnosis not present

## 2021-12-04 DIAGNOSIS — Z79899 Other long term (current) drug therapy: Secondary | ICD-10-CM | POA: Diagnosis not present

## 2021-12-04 DIAGNOSIS — R0789 Other chest pain: Secondary | ICD-10-CM

## 2021-12-04 DIAGNOSIS — Z7982 Long term (current) use of aspirin: Secondary | ICD-10-CM | POA: Diagnosis not present

## 2021-12-04 DIAGNOSIS — R079 Chest pain, unspecified: Secondary | ICD-10-CM

## 2021-12-04 DIAGNOSIS — R600 Localized edema: Secondary | ICD-10-CM | POA: Insufficient documentation

## 2021-12-04 DIAGNOSIS — R609 Edema, unspecified: Secondary | ICD-10-CM

## 2021-12-04 LAB — BASIC METABOLIC PANEL
Anion gap: 7 (ref 5–15)
BUN: 17 mg/dL (ref 8–23)
CO2: 28 mmol/L (ref 22–32)
Calcium: 9.7 mg/dL (ref 8.9–10.3)
Chloride: 110 mmol/L (ref 98–111)
Creatinine, Ser: 1.06 mg/dL — ABNORMAL HIGH (ref 0.44–1.00)
GFR, Estimated: 59 mL/min — ABNORMAL LOW (ref >60)
Glucose, Bld: 92 mg/dL (ref 70–99)
Potassium: 3.6 mmol/L (ref 3.5–5.1)
Sodium: 145 mmol/L (ref 135–145)

## 2021-12-04 LAB — TROPONIN I (HIGH SENSITIVITY)
Troponin I (High Sensitivity): 6 ng/L (ref <18)
Troponin I (High Sensitivity): 6 ng/L (ref <18)

## 2021-12-04 LAB — URINALYSIS, ROUTINE W REFLEX MICROSCOPIC
Bilirubin Urine: NEGATIVE
Glucose, UA: NEGATIVE mg/dL
Hgb urine dipstick: NEGATIVE
Ketones, ur: NEGATIVE mg/dL
Leukocytes,Ua: NEGATIVE
Nitrite: NEGATIVE
Protein, ur: NEGATIVE mg/dL
Specific Gravity, Urine: 1.006 (ref 1.005–1.030)
pH: 6 (ref 5.0–8.0)

## 2021-12-04 LAB — CULTURE, BLOOD (ROUTINE X 2)
Culture: NO GROWTH
Culture: NO GROWTH
Special Requests: ADEQUATE
Special Requests: ADEQUATE

## 2021-12-04 LAB — CBC WITH DIFFERENTIAL/PLATELET
Abs Immature Granulocytes: 0.05 10*3/uL (ref 0.00–0.07)
Basophils Absolute: 0 10*3/uL (ref 0.0–0.1)
Basophils Relative: 1 %
Eosinophils Absolute: 0.3 10*3/uL (ref 0.0–0.5)
Eosinophils Relative: 3 %
HCT: 35.4 % — ABNORMAL LOW (ref 36.0–46.0)
Hemoglobin: 11.4 g/dL — ABNORMAL LOW (ref 12.0–15.0)
Immature Granulocytes: 1 %
Lymphocytes Relative: 32 %
Lymphs Abs: 2.4 10*3/uL (ref 0.7–4.0)
MCH: 28.9 pg (ref 26.0–34.0)
MCHC: 32.2 g/dL (ref 30.0–36.0)
MCV: 89.8 fL (ref 80.0–100.0)
Monocytes Absolute: 0.7 10*3/uL (ref 0.1–1.0)
Monocytes Relative: 9 %
Neutro Abs: 4.1 10*3/uL (ref 1.7–7.7)
Neutrophils Relative %: 54 %
Platelets: 287 10*3/uL (ref 150–400)
RBC: 3.94 MIL/uL (ref 3.87–5.11)
RDW: 13.2 % (ref 11.5–15.5)
WBC: 7.6 10*3/uL (ref 4.0–10.5)
nRBC: 0 % (ref 0.0–0.2)

## 2021-12-04 LAB — BRAIN NATRIURETIC PEPTIDE: B Natriuretic Peptide: 109.2 pg/mL — ABNORMAL HIGH (ref 0.0–100.0)

## 2021-12-04 MED ORDER — FENTANYL CITRATE PF 50 MCG/ML IJ SOSY
50.0000 ug | PREFILLED_SYRINGE | Freq: Once | INTRAMUSCULAR | Status: AC
  Administered 2021-12-04: 50 ug via INTRAVENOUS
  Filled 2021-12-04: qty 1

## 2021-12-04 NOTE — ED Provider Notes (Signed)
Care assumed from [facility] at shift change, please see their note for full detail, but in brief [name] is a 61 y.o. female presents with chest pain and SOB.  Plan: Pending BNP and second trop with likely admission for CHF exacerbation  Physical Exam  BP (!) 147/81 (BP Location: Left Wrist)   Pulse 65   Temp 98.2 F (36.8 C) (Oral)   Resp 20   Ht '4\' 11"'$  (1.499 m)   Wt 93.4 kg   SpO2 96%   BMI 41.61 kg/m   Physical Exam  Procedures  Procedures  ED Course / MDM    Medical Decision Making Amount and/or Complexity of Data Reviewed Labs: ordered. Radiology: ordered.  Risk Prescription drug management.   Labs returned and were overall reassuring.  Her BNP was only mildly elevated at 109 which is not consistent with a CHF exacerbation.  Additionally her delta troponin was normal.  The rest of her labs were all normal, so this does not appear to be a admissible CHF exacerbation.  I discussed the case with Dr. Kathrynn Humble who agrees that patient can likely be managed outpatient.  He went to personally evaluate the patient and discussed her results and patient was discharged home with instructions to follow-up with PCP.       Rodena Piety 12/04/21 [id number]    Tretha Sciara, MD 12/04/21 [phone]

## 2021-12-04 NOTE — Discharge Instructions (Signed)
Please have your grandson take you directly to the emergency room for further evaluation and management as we discussed.  I am concerned that you are having a heart failure exacerbation but we also need to rule out other things such as something going on with your heart or a blood clot in your lungs.

## 2021-12-04 NOTE — ED Provider Notes (Addendum)
[name]    CSN: [id number] Arrival date & time: 12/04/21  1452      History   Chief Complaint Chief Complaint  Patient presents with   Shortness of Breath    HPI [name] is a 61 y.o. female.   Patient presents today with a 2-day history of shortness of breath, headache, chest pain, swelling.  She reports chest pain is rated 8 on a 0-10 pain scale, localized to anterior chest, described as pressure/heaviness, no aggravating alleviating factors identified.  She reports associated shortness of breath as well as leg swelling.  She was seen in the emergency room on 11/29/2021 at which point she was given multiple liters of fluid.  She reports that his symptoms began soon after this experience.  She does monitor her weight daily because of a history of heart failure and reports that she gained approximately 4 pounds after that visit.  She has been taking Lasix as prescribed but has not been able to drop any weight.  She does have a history of blood clots as well as active malignancy.  She is not currently on any blood thinning medication.  She denies any recent travel or immobilization.  Denies exogenous hormone use.  Reports that she is feeling poorly.  Her grandson brought her to the clinic today.    Past Medical History:  Diagnosis Date   Allergy    Anginal pain ([place])    a. NL cath in 2008;  b. Myoview 03/2011: dec uptake along mid anterior wall on stress imaging -> ? attenuation vs. ischemia, EF 65%;  c. Echo 04/2011: EF 55-60%, no RWMA, Gr 2 dd   Anxiety    Arthritis    Asthma    Back pain    Bone cancer (HCC)    Cancer (HCC)    Chest pain    CHF (congestive heart failure) (HCC)    Clotting disorder (HCC)    Constipation    Depression    Diabetes mellitus    Drug use    Dyspnea    Frequent urination    GERD (gastroesophageal reflux disease)    Glaucoma    History of stomach ulcers    HLD (hyperlipidemia)    Hypertension    IBS (irritable bowel  syndrome)    Joint pain    Joint pain    Lactose intolerance    Leg edema    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 benign thymoma   Neuromuscular disorder ([place])    Obesity    Palpitations    Pneumonia 05/2016   double   Pulmonary edema    Pulmonary embolism ([place])    a. 2008 -> coumadin x 6 mos.   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   Back pain 06/01/2021   Right thyroid nodule 05/31/2021   At risk for impaired metabolic function 81/04/7508   Unilateral primary osteoarthritis, left hip    Hip arthritis 06/11/2018  Class 3 severe obesity with serious comorbidity and body mass index (BMI) of 40.0 to 44.9 in adult [facility]) 05/29/2018   Other fatigue 11/20/2017   Shortness of breath on exertion 11/20/2017   Diabetes mellitus ([place]) 11/20/2017   Vitamin D deficiency 11/20/2017   Depression 11/20/2017   Other hyperlipidemia 11/20/2017   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/04/2016   Presence of left artificial knee joint 09/20/2016   Arthritis of knee 08/22/2016   Cervical radiculopathy 07/26/2016   Cervical disc disorder with radiculopathy 07/26/2016   Right arm pain 06/29/2016   Cervicalgia 06/29/2016   Primary osteoarthritis of left knee 06/29/2016   Hypersomnia with sleep apnea 11/18/2015   Lethargy 11/18/2015   Exertional chest pain 09/30/2014   Abnormal cardiac function test 09/29/2014   Chest pain with moderate risk for cardiac etiology 09/28/2014   HTN (hypertension) 09/28/2014   Hypokalemia    Paresthesia 06/18/2014   Numbness and  tingling of left arm and leg    Unstable angina ([place]) 05/24/2014   OSA on CPAP 05/24/2014   CAD (coronary artery disease) 11/25/2012   S/P laparoscopic appendectomy 78/93/8101   Diastolic CHF ([place]) 75/01/2584   Mediastinal mass 01/09/2012   SOB (shortness of breath) 06/20/2011   Mediastinal abnormality 06/20/2011   Dyslipidemia 12/23/2009   GLAUCOMA 12/23/2009   ARTHRITIS 12/23/2009   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   Class 2 severe obesity with serious comorbidity and body mass index (BMI) of 38.0 to 38.9 in adult ([place]) 09/13/2006   ANXIETY STATE NOS 09/13/2006   DISORDER, DEPRESSIVE NEC 09/13/2006   CARPAL TUNNEL SYNDROME, MILD 09/13/2006   Unspecified essential hypertension 09/13/2006   IBS 09/13/2006   DEGENERATION, LUMBAR/LUMBOSACRAL DISC 09/13/2006   SYMPTOM, SWELLING/MASS/LUMP IN CHEST 09/13/2006   PULMONARY EMBOLISM, HX OF 09/13/2006    Past Surgical History:  Procedure Laterality Date   ABDOMINAL HYSTERECTOMY  2005   APPENDECTOMY     BREAST LUMPECTOMY WITH RADIOACTIVE SEED AND SENTINEL LYMPH NODE BIOPSY Right 11/16/2021   Procedure: RIGHT BREAST RADIOACTIVE SEED LOCALIZED LUMPECTOMY WITH RIGHT AXILLARY SENTINEL LYMPH NODE BIOPSY;  Surgeon: Donnie Mesa, MD;  Location: [place];  Service: General;  Laterality: Right;  LMA & PEC BLOCK   CARDIAC CATHETERIZATION     Normal   CARDIAC CATHETERIZATION N/A 09/30/2014   Procedure: Left Heart Cath and Coronary Angiography;  Surgeon: Sherren Mocha, MD;  Location: [facility];  Service: Cardiovascular;  Laterality: N/A;   LAPAROSCOPIC APPENDECTOMY N/A 06/03/2012   Procedure: APPENDECTOMY LAPAROSCOPIC;  Surgeon: Stark Klein, MD;  Location: [place];  Service: General;  Laterality: N/A;   Left knee surgery  2008   LEG SURGERY     TONSILLECTOMY     TOTAL HIP ARTHROPLASTY Left 06/11/2018   Procedure: LEFT TOTAL HIP ARTHROPLASTY ANTERIOR APPROACH;  Surgeon: Meredith Pel, MD;  Location: [place];  Service:  Orthopedics;  Laterality: Left;   TOTAL KNEE ARTHROPLASTY Left 08/22/2016   Procedure: TOTAL KNEE ARTHROPLASTY;  Surgeon: Meredith Pel, MD;  Location: [place];  Service: Orthopedics;  Laterality: Left;   TUBAL LIGATION  1989    OB History     Gravida  4   Para  3   Term  3   Preterm  0   AB  1   Living  3      SAB  1   IAB  0   Ectopic  0   Multiple  0   Live Births  3  Home Medications    Prior to Admission medications   Medication Sig Start Date End Date Taking? Authorizing Provider  ACCU-CHEK GUIDE test strip  10/28/21   [provider]  albuterol (PROVENTIL HFA;VENTOLIN HFA) 108 (90 Base) MCG/ACT inhaler Inhale 2 puffs into the lungs every 6 (six) hours as needed for wheezing or shortness of breath.    [provider]  aspirin 81 MG chewable tablet Chew 81 mg by mouth daily.    [provider]  atorvastatin (LIPITOR) 40 MG tablet Take 40 mg by mouth at bedtime.     [provider]  busPIRone (BUSPAR) 30 MG tablet Take 30 mg by mouth 2 (two) times daily. 12/18/19   [provider]  carvedilol (COREG) 6.25 MG tablet Take 1 tablet (6.25 mg total) by mouth 2 (two) times daily. 09/06/21   Josue Hector, MD  diclofenac Sodium (VOLTAREN) 1 % GEL Apply 2 g topically 4 (four) times daily as needed (joint/muscle pain). 09/20/19   [provider]  doxycycline (VIBRAMYCIN) 100 MG capsule Take 1 capsule (100 mg total) by mouth 2 (two) times daily for 7 days. 11/30/21 12/07/21  Fransico [name], MD  EPINEPHrine 0.3 mg/0.3 mL IJ SOAJ injection Inject 0.3 mg into the muscle as needed for anaphylaxis. Follow package instructions as needed for severe allergy or anaphylactic reaction. 11/06/21   Carrie Mew, MD  furosemide (LASIX) 40 MG tablet Take 60 mg by mouth daily.    Rai, Vernelle Emerald, MD  hydrOXYzine (VISTARIL) 25 MG capsule Take 25 mg by mouth 3 (three) times daily as needed for itching.    [provider]  isosorbide dinitrate (ISORDIL) 20 MG tablet Take 20 mg by mouth daily.    [provider]  lidocaine (LIDODERM) 5 % Place 1 patch onto the skin daily as needed for pain. 02/11/21   [provider]  lisinopril (PRINIVIL,ZESTRIL) 20 MG tablet Take 20 mg by mouth daily.  09/21/14   [provider]  loperamide (IMODIUM A-D) 2 MG tablet Take 4 mg by mouth 4 (four) times daily as needed for diarrhea or loose stools.     [provider]  metFORMIN (GLUCOPHAGE) 500 MG tablet Take 1 tablet (500 mg total) by mouth daily with breakfast. 11/21/21   Bowen, Collene Leyden, DO  MYRBETRIQ 25 MG TB24 tablet Take 1 tablet (25 mg total) by mouth daily. 03/13/19   Dennard Nip D, MD  nystatin (MYCOSTATIN/NYSTOP) powder Apply 1 application topically 3 (three) times daily as needed for rash.    [provider]  oxyCODONE (OXY IR/ROXICODONE) 5 MG immediate release tablet Take 1 tablet (5 mg total) by mouth every 6 (six) hours as needed for severe pain. 11/16/21   Donnie Mesa, MD  pantoprazole (PROTONIX) 40 MG tablet Take 1 tablet (40 mg total) by mouth 2 (two) times daily. 03/09/21   Domenic Polite, MD  potassium chloride (KLOR-CON M10) 10 MEQ tablet Take 1 tablet (10 mEq total) by mouth 2 (two) times daily. 05/27/18   Jearld Lesch A, DO  Semaglutide,0.25 or 0.'5MG'$ /DOS, (OZEMPIC, 0.25 OR 0.5 MG/DOSE,) 2 MG/1.5ML SOPN Inject 0.5 mg into the skin once a week. 11/21/21   Bowen, Collene Leyden, DO  sertraline (ZOLOFT) 25 MG tablet Take 1 tablet (25 mg total) by mouth daily. 11/30/21   Bowen, Collene Leyden, DO  Vitamin D, Ergocalciferol, (DRISDOL) 1.25 MG (50000 UNIT) CAPS capsule Take 1 capsule (50,000 Units total) by mouth every 7 (seven) days. 11/21/21  Bowen, Collene Leyden, DO    Family History Family History  Problem Relation Age of Onset   Emphysema Mother    Arthritis Mother    Heart failure Mother        alive @ 50   Stroke Mother    Diabetes Mother    Hypertension Mother     Hyperlipidemia Mother    Depression Mother    Anxiety disorder Mother    Heart disease Mother    Alcoholism Mother    Depression Father    Heart disease Father        died @ 24's.   Stroke Father    Diabetes Father    Hyperlipidemia Father    Hypertension Father    Bipolar disorder Father    Sleep apnea Father    Alcoholism Father    Drug abuse Father    Sudden death Father    Diabetes Sister    Asthma Brother    Breast cancer Maternal Aunt 70 - 71   Colon cancer Maternal Grandfather        dx after 81   Heart disease Paternal Grandfather    Breast cancer Cousin        dx 34s   Esophageal cancer Neg Hx    Stomach cancer Neg Hx    Rectal cancer Neg Hx     Social History Social History   Tobacco Use   Smoking status: Former    Packs/day: 0.50    Years: 15.00    Total pack years: 7.50    Types: Cigarettes    Quit date: 04/24/1985    Years since quitting: 36.6   Smokeless tobacco: Never  Vaping Use   Vaping Use: Never used  Substance Use Topics   Alcohol use: No    Alcohol/week: 0.0 standard drinks of alcohol   Drug use: Not Currently    Types: Marijuana     Allergies   Hydrocodone-acetaminophen, Penicillins, Shellfish allergy, Latex, and Sulfa antibiotics   Review of Systems Review of Systems  Constitutional:  Positive for activity change. Negative for appetite change, fatigue and fever.  Respiratory:  Positive for chest tightness and shortness of breath. Negative for cough.   Cardiovascular:  Positive for chest pain and leg swelling. Negative for palpitations.  Gastrointestinal:  Negative for abdominal pain, diarrhea, nausea and vomiting.  Neurological:  Positive for headaches. Negative for dizziness and light-headedness.     Physical Exam Triage Vital Signs ED Triage Vitals  Enc Vitals Group     BP 12/04/21 1519 120/66     Pulse Rate 12/04/21 1519 66     Resp 12/04/21 1519 19     Temp 12/04/21 1519 98 F (36.7 C)     Temp src --      SpO2  12/04/21 1519 97 %     Weight --      Height --      Head Circumference --      Peak Flow --      Pain Score 12/04/21 1517 8     Pain Loc --      Pain Edu? --      Excl. in [place]? --    No data found.  Updated Vital Signs BP 120/66   Pulse 66   Temp 98 F (36.7 C)   Resp 19   SpO2 97%   Visual Acuity Right Eye Distance:   Left Eye Distance:   Bilateral Distance:    Right Eye Near:  Left Eye Near:    Bilateral Near:     Physical Exam Vitals reviewed.  Constitutional:      General: She is awake. She is not in acute distress.    Appearance: Normal appearance. She is well-developed. She is not ill-appearing.     Comments: Very pleasant female appears stated age in no acute distress  HENT:     Head: Normocephalic and atraumatic.  Cardiovascular:     Rate and Rhythm: Normal rate and regular rhythm.     Heart sounds: Normal heart sounds, S1 normal and S2 normal. No murmur heard.    Comments: 1+ pitting edema to mid anterior tibia bilaterally Pulmonary:     Effort: Pulmonary effort is normal.     Breath sounds: Normal breath sounds. No wheezing, rhonchi or rales.     Comments: Clear to auscultation bilaterally Abdominal:     General: Bowel sounds are normal.     Palpations: Abdomen is soft.     Tenderness: There is no abdominal tenderness. There is no right CVA tenderness, left CVA tenderness, guarding or rebound.  Musculoskeletal:     Right lower leg: 1+ Edema present.     Left lower leg: 1+ Edema present.  Psychiatric:        Behavior: Behavior is cooperative.      UC Treatments / Results  Labs (all labs ordered are listed, but only abnormal results are displayed) Labs Reviewed - No data to display  EKG   Radiology No results found.  Procedures Procedures (including critical care time)  Medications Ordered in UC Medications - No data to display  Initial Impression / Assessment and Plan / UC Course  I have reviewed the triage vital signs and the  nursing notes.  Pertinent labs & imaging results that were available during my care of the patient were reviewed by me and considered in my medical decision making (see chart for details).     Patient is well-appearing, afebrile, nontoxic, nontachycardic.  Concern for heart failure exacerbation given shortness of breath with sudden weight gain.  Given her clinical presentation, however, cannot rule out other concerning etiology including cardiovascular origin or PE.  Patient does have several risk factors for PE.  EKG was obtained that showed normal sinus rhythm with ventricular rate of 65 bpm.  Compared to 11/29/2021 tracing no significant change.  Discussed that ultimately she will need to be evaluated in the emergency room for additional work-up which we do not have the ability to perform in urgent care.  Patient was agreeable to this and will go by private vehicle with her grandson driving to ER.  Her vital signs are stable at the time of discharge.  Final Clinical Impressions(s) / UC Diagnoses   Final diagnoses:  Shortness of breath  Chest discomfort     Discharge Instructions      Please have your grandson take you directly to the emergency room for further evaluation and management as we discussed.  I am concerned that you are having a heart failure exacerbation but we also need to rule out other things such as something going on with your heart or a blood clot in your lungs.     ED Prescriptions   None    PDMP not reviewed this encounter.   Terrilee Croak, PA-C 12/04/21 1614    Kyshawn Teal, Derry Skill, PA-C 12/04/21 1615

## 2021-12-04 NOTE — ED Notes (Signed)
Patient is being discharged from the Urgent Care and sent to the Emergency Department via POV . Per Verna Czech PA, patient is in need of higher level of care due to chest pain and SOB . Patient is aware and verbalizes understanding of plan of care.  Vitals:   12/04/21 1519  BP: 120/66  Pulse: 66  Resp: 19  Temp: 98 F (36.7 C)  SpO2: 97%

## 2021-12-04 NOTE — ED Notes (Signed)
Pt currently using bedside commode

## 2021-12-04 NOTE — ED Triage Notes (Signed)
Pt arrives with c/o chest pain and SOB. Per pt, she feels like she has some swelling in her ankles. Pt has hx of HF. Per pt, she has gained 3-4lbs in the past 5 days.

## 2021-12-04 NOTE — ED Provider Triage Note (Signed)
Emergency Medicine Provider Triage Evaluation Note  [name] , a 61 y.o. female  was evaluated in triage.  Pt complains of chest pain and worsening shortness of breath over the last 4 to 5 days.  Patient has history of heart failure and says that she is compliant on her diuretic and does not skip doses.  She is also on a medically managed diet to help her lose weight, and she states that over the last 4 to 5 days, she has had a 4 to 5 pound increase in weight.  She has also noticed increased fluid around her ankles, although this seems slightly improved today than it did this morning.  Denies abdominal pain, nausea, vomiting and diarrhea  Review of Systems  Positive: As above Negative:  Physical Exam  BP 135/73   Pulse 77   Temp 98.3 F (36.8 C) (Oral)   Resp 20   Ht '4\' 11"'$  (1.499 m)   Wt 93.4 kg   SpO2 100%   BMI 41.61 kg/m  Gen:   Awake, no distress   Resp:  Normal effort  MSK:   Moves extremities without difficulty, maybe 1+ pitting edema in the bilateral ankles Other:    Medical Decision Making  Medically screening exam initiated at 5:13 PM.  Appropriate orders placed.  [name] was informed that the remainder of the evaluation will be completed by another provider, this initial triage assessment does not replace that evaluation, and the importance of remaining in the ED until their evaluation is complete.     Tonye Pearson, [place] 12/04/21 1714

## 2021-12-04 NOTE — ED Notes (Signed)
Assumed pt care with RN oversight. Pt A&Ox4, respirations equal & unlabored.  

## 2021-12-04 NOTE — ED Triage Notes (Signed)
Pt is present today with c/o SOB, HA, heaviness in her chest, and fluid buildup. Pt sx started x2 days ago

## 2021-12-04 NOTE — ED Provider Notes (Signed)
[facility] DEPT Provider Note   CSN: [id number] Arrival date & time: 12/04/21  1624     History  Chief Complaint  Patient presents with   Chest Pain    [name] is a 61 y.o. female.  Patient presents to the hospital complaining of substernal chest pain described as a pressure which she rates as 8 out of 10 in severity.  The pain has been ongoing for days.  She was seen in the emergency department on August 8 for similar pain.  Work-up was grossly benign for any cardiac or pulmonary cause.  Patient states she has continued to have pain and shortness of breath.  She was urgent care earlier today who was concerned that the patient could have exacerbation of her CHF and also had concerns that the patient could potentially have a pulmonary embolism.  She had edema which had not been present on previous exams.  The patient denies abdominal pain.  Does complain of vague bilateral thigh pain.  She endorses having low back problems and wonders if this may be the cause of her thigh pain.  The patient recently had a lumpectomy due to breast cancer on the right side.  She does not currently take blood thinners.  She does endorse a history of previous PE.  Past medical history is significant for hypertension, asthma, depression, PE, obesity, pulmonary edema, history of pneumonia, palpitations, clotting disorder, DM, CHF, anginal pain, GERD, TIA, leg edema, back pain  HPI     Home Medications Prior to Admission medications   Medication Sig Start Date End Date Taking? Authorizing Provider  ACCU-CHEK GUIDE test strip  10/28/21   [provider]  albuterol (PROVENTIL HFA;VENTOLIN HFA) 108 (90 Base) MCG/ACT inhaler Inhale 2 puffs into the lungs every 6 (six) hours as needed for wheezing or shortness of breath.    [provider]  aspirin 81 MG chewable tablet Chew 81 mg by mouth daily.    [provider]  atorvastatin (LIPITOR) 40 MG tablet  Take 40 mg by mouth at bedtime.     [provider]  busPIRone (BUSPAR) 30 MG tablet Take 30 mg by mouth 2 (two) times daily. 12/18/19   [provider]  carvedilol (COREG) 6.25 MG tablet Take 1 tablet (6.25 mg total) by mouth 2 (two) times daily. 09/06/21   Josue Hector, MD  diclofenac Sodium (VOLTAREN) 1 % GEL Apply 2 g topically 4 (four) times daily as needed (joint/muscle pain). 09/20/19   [provider]  doxycycline (VIBRAMYCIN) 100 MG capsule Take 1 capsule (100 mg total) by mouth 2 (two) times daily for 7 days. 11/30/21 12/07/21  Fransico [name], MD  EPINEPHrine 0.3 mg/0.3 mL IJ SOAJ injection Inject 0.3 mg into the muscle as needed for anaphylaxis. Follow package instructions as needed for severe allergy or anaphylactic reaction. 11/06/21   Carrie Mew, MD  furosemide (LASIX) 40 MG tablet Take 60 mg by mouth daily.    Rai, Vernelle Emerald, MD  hydrOXYzine (VISTARIL) 25 MG capsule Take 25 mg by mouth 3 (three) times daily as needed for itching.    [provider]  isosorbide dinitrate (ISORDIL) 20 MG tablet Take 20 mg by mouth daily.    [provider]  lidocaine (LIDODERM) 5 % Place 1 patch onto the skin daily as needed for pain. 02/11/21   [provider]  lisinopril (PRINIVIL,ZESTRIL) 20 MG tablet Take 20 mg by mouth daily.  09/21/14   [provider]  loperamide (IMODIUM A-D) 2 MG tablet Take 4 mg by mouth 4 (four) times daily as needed for diarrhea or loose stools.     [provider]  metFORMIN (GLUCOPHAGE) 500 MG tablet Take 1 tablet (500 mg total) by mouth daily with breakfast. 11/21/21   Bowen, Collene Leyden, DO  MYRBETRIQ 25 MG TB24 tablet Take 1 tablet (25 mg total) by mouth daily. 03/13/19   Dennard Nip D, MD  nystatin (MYCOSTATIN/NYSTOP) powder Apply 1 application topically 3 (three) times daily as needed for rash.    [provider]  oxyCODONE (OXY IR/ROXICODONE) 5 MG immediate release tablet Take 1  tablet (5 mg total) by mouth every 6 (six) hours as needed for severe pain. 11/16/21   Donnie Mesa, MD  pantoprazole (PROTONIX) 40 MG tablet Take 1 tablet (40 mg total) by mouth 2 (two) times daily. 03/09/21   Domenic Polite, MD  potassium chloride (KLOR-CON M10) 10 MEQ tablet Take 1 tablet (10 mEq total) by mouth 2 (two) times daily. 05/27/18   Jearld Lesch A, DO  Semaglutide,0.25 or 0.'5MG'$ /DOS, (OZEMPIC, 0.25 OR 0.5 MG/DOSE,) 2 MG/1.5ML SOPN Inject 0.5 mg into the skin once a week. 11/21/21   Bowen, Collene Leyden, DO  sertraline (ZOLOFT) 25 MG tablet Take 1 tablet (25 mg total) by mouth daily. 11/30/21   Bowen, Collene Leyden, DO  Vitamin D, Ergocalciferol, (DRISDOL) 1.25 MG (50000 UNIT) CAPS capsule Take 1 capsule (50,000 Units total) by mouth every 7 (seven) days. 11/21/21   Bowen, Collene Leyden, DO      Allergies    Hydrocodone-acetaminophen, Penicillins, Shellfish allergy, Latex, and Sulfa antibiotics    Review of Systems   Review of Systems  Respiratory:  Positive for shortness of breath.   Cardiovascular:  Positive for chest pain.  Gastrointestinal:  Negative for abdominal pain, nausea and vomiting.  Genitourinary:  Negative for dysuria.    Physical Exam Updated Vital Signs BP (!) 146/80 (BP Location: Left Arm)   Pulse 74   Temp 98.2 F (36.8 C) (Oral)   Resp 20   Ht '4\' 11"'$  (1.499 m)   Wt 93.4 kg   SpO2 98%   BMI 41.61 kg/m  Physical Exam Vitals and nursing note reviewed.  Constitutional:      General: She is not in acute distress.    Appearance: She is obese.  HENT:     Head: Normocephalic and atraumatic.  Eyes:     Pupils: Pupils are equal, round, and reactive to light.  Cardiovascular:     Rate and Rhythm: Normal rate and regular rhythm.     Heart sounds: Normal heart sounds. No murmur heard.    No friction rub. No gallop.  Pulmonary:     Effort: Pulmonary effort is normal.     Breath sounds: Normal breath sounds. No decreased breath sounds, wheezing, rhonchi or rales.  Chest:      Chest wall: No tenderness.  Abdominal:     Palpations: Abdomen is soft.     Tenderness: There is no abdominal tenderness.  Musculoskeletal:        General: Normal range of motion.     Cervical back: Normal range of motion and neck supple.     Right lower leg: Edema present.     Left lower leg: Edema present.     Comments: +1 edema to tibia bilaterally  Skin:    General: Skin is warm and dry.     Capillary Refill: Capillary refill takes less than 2  seconds.  Neurological:     Mental Status: She is alert.     ED Results / Procedures / Treatments   Labs (all labs ordered are listed, but only abnormal results are displayed) Labs Reviewed  BASIC METABOLIC PANEL - Abnormal; Notable for the following components:      Result Value   Creatinine, Ser 1.06 (*)    GFR, Estimated 59 (*)    All other components within normal limits  CBC WITH DIFFERENTIAL/PLATELET - Abnormal; Notable for the following components:   Hemoglobin 11.4 (*)    HCT 35.4 (*)    All other components within normal limits  URINALYSIS, ROUTINE W REFLEX MICROSCOPIC - Abnormal; Notable for the following components:   Color, Urine COLORLESS (*)    All other components within normal limits  BRAIN NATRIURETIC PEPTIDE  TROPONIN I (HIGH SENSITIVITY)  TROPONIN I (HIGH SENSITIVITY)    EKG None  Radiology DG Chest 2 View  Result Date: 12/04/2021 CLINICAL DATA:  Dyspnea, history of CHF EXAM: CHEST - 2 VIEW COMPARISON:  Radiographs 11/29/2021 FINDINGS: Since 11/29/2021, new hazy airspace opacities in the lower lung suggestive of edema. Remainder unchanged. Stable cardiomediastinal silhouette. Aortic calcification. No pleural effusion or pneumothorax. No acute osseous abnormality. Surgical clips right breast. IMPRESSION: Hazy airspace opacities in the lower lung suggestive of mild edema. Aortic Atherosclerosis (ICD10-I70.0). Electronically Signed   By: Placido Sou M.D.   On: 12/04/2021 17:04    Procedures Procedures     Medications Ordered in ED Medications - No data to display  ED Course/ Medical Decision Making/ A&P                           Medical Decision Making  This patient presents to the ED for concern of chest pain, this involves an extensive number of treatment options, and is a complaint that carries with it a high risk of complications and morbidity.  The differential diagnosis includes CHF exacerbation, ACS, PE, pneumonia, and others   Co morbidities that complicate the patient evaluation  History of CHF, PE   Additional history obtained:   External records from outside source obtained and reviewed including urgent care notes and emergency department notes documenting the same complaint over the past few days and previous work-up showing CT angio PE study with negative result.   Lab Tests:  I Ordered, and personally interpreted labs.  The pertinent results include:  Hemoglobin 11.4, creatinine 1.06, unremarkable urinalysis, initial troponin 6   Imaging Studies ordered:  I ordered imaging studies including chest x-ray I independently visualized and interpreted imaging which showed Hazy airspace opacities in the lower lung suggestive of mild edema. I agree with the radiologist interpretation   Cardiac Monitoring: / EKG:  The patient was maintained on a cardiac monitor.  I personally viewed and interpreted the cardiac monitored which showed an underlying rhythm of: Sinus rhythm  Problem List / ED Course / Critical interventions / Medication management   I ordered medication including fentanyl for pain Reevaluation of the patient after these medicines showed that the patient improved I have reviewed the patients home medicines and have made adjustments as needed   Test / Admission - Considered:  Second troponin and BNP pending at this time. Patient care being transferred to [facility], PA-C at shift handoff. Plan for probable admission depending on blood work  results.         Final Clinical Impression(s) / ED Diagnoses Final diagnoses:  None    Rx / DC Orders ED Discharge Orders     None         Ronny Bacon 12/04/21 Sonia Baller, MD 12/10/21 [phone]

## 2021-12-04 NOTE — Discharge Instructions (Signed)
We saw you in the ER for the chest pain/shortness of breath. All of our cardiac workup is normal, including labs, EKG and chest X-RAY are normal. We are not sure what is causing your discomfort, but we feel comfortable sending you home at this time. The workup in the ER is not complete, and you should follow up with your primary care doctor for further evaluation.  For your leg swelling, take an additional 40 mg Lasix at nighttime.  This means you will take 60 mg Lasix in the morning and 40 mg at night for the next 3 to 5 days only.  Daily morning weights will be helpful.  Keep the legs elevated at nighttime.  If you have compression stockings, please wear them during daytime.  Please return to the ER if you have worsening chest pain, shortness of breath, pain radiating to your jaw, shoulder, or back, sweats or fainting. Otherwise see the Cardiologist or your primary care doctor as requested.

## 2021-12-05 ENCOUNTER — Encounter: Payer: Self-pay | Admitting: *Deleted

## 2021-12-05 ENCOUNTER — Telehealth: Payer: Self-pay | Admitting: Hematology and Oncology

## 2021-12-05 NOTE — Telephone Encounter (Signed)
Scheduled appointment per provider. Left voicemail.

## 2021-12-06 ENCOUNTER — Other Ambulatory Visit: Payer: Self-pay | Admitting: Family

## 2021-12-06 ENCOUNTER — Ambulatory Visit
Admission: RE | Admit: 2021-12-06 | Discharge: 2021-12-06 | Disposition: A | Discharge status: Home / Self Care | Payer: Medicare Other | Source: Ambulatory Visit | Attending: Family | Admitting: Family

## 2021-12-06 DIAGNOSIS — R0789 Other chest pain: Secondary | ICD-10-CM

## 2021-12-07 ENCOUNTER — Ambulatory Visit: Payer: Medicare Other | Attending: Surgery | Admitting: Rehabilitation

## 2021-12-07 DIAGNOSIS — R293 Abnormal posture: Secondary | ICD-10-CM | POA: Insufficient documentation

## 2021-12-07 DIAGNOSIS — Z171 Estrogen receptor negative status [ER-]: Secondary | ICD-10-CM | POA: Insufficient documentation

## 2021-12-07 DIAGNOSIS — C50411 Malignant neoplasm of upper-outer quadrant of right female breast: Secondary | ICD-10-CM | POA: Insufficient documentation

## 2021-12-11 NOTE — Progress Notes (Unsigned)
Office Visit    Patient Name: [name] Date of Encounter: 12/12/2021  Primary Care Provider:  Sonia Side., FNP Primary Cardiologist:  Tara Rouge, MD Primary Electrophysiologist: None  Chief Complaint    [name] is a 61 y.o. female with PMH of breast CA (newly diagnosed), HFpEF, OSA, remote hx of PE no longer on [place], TIA, HTN, and HLD, atypical chest pain who presents today for post ED follow-up for shortness of breath and chest discomfort.  Past Medical History    Past Medical History:  Diagnosis Date   Allergy    Anginal pain ([place])    a. NL cath in 2008;  b. Myoview 03/2011: dec uptake along mid anterior wall on stress imaging -> ? attenuation vs. ischemia, EF 65%;  c. 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 2008 -> coumadin x 6 mos.   Rheumatoid arthritis ([place])    Sleep apnea    on CPAP 02/2018   SOB (shortness of breath)    Thyroid disease    TIA (transient ischemic attack)    Urinary urgency    Vitamin D deficiency    Past Surgical History:  Procedure Laterality Date   ABDOMINAL HYSTERECTOMY  2005   APPENDECTOMY     BREAST LUMPECTOMY WITH RADIOACTIVE SEED AND SENTINEL LYMPH NODE BIOPSY Right 11/16/2021   Procedure: RIGHT BREAST RADIOACTIVE SEED  LOCALIZED LUMPECTOMY WITH RIGHT AXILLARY SENTINEL LYMPH NODE BIOPSY;  Surgeon: Donnie Mesa, MD;  Location: [facility];  Service: General;  Laterality: Right;  LMA & PEC BLOCK   CARDIAC CATHETERIZATION     Normal   CARDIAC CATHETERIZATION N/A 09/30/2014   Procedure: Left Heart Cath and Coronary Angiography;  Surgeon: Sherren Mocha, MD;  Location: [facility];  Service: Cardiovascular;  Laterality: N/A;   LAPAROSCOPIC APPENDECTOMY N/A 06/03/2012   Procedure: APPENDECTOMY LAPAROSCOPIC;  Surgeon: Stark Klein, MD;  Location: [facility];  Service: General;  Laterality: N/A;   Left knee surgery  2008   LEG SURGERY     TONSILLECTOMY     TOTAL HIP ARTHROPLASTY Left 06/11/2018   Procedure: LEFT TOTAL HIP ARTHROPLASTY ANTERIOR APPROACH;  Surgeon: Meredith Pel, MD;  Location: [facility];  Service: Orthopedics;  Laterality: Left;   TOTAL KNEE ARTHROPLASTY Left 08/22/2016   Procedure: TOTAL KNEE ARTHROPLASTY;  Surgeon: Meredith Pel, MD;  Location: [facility];  Service: Orthopedics;  Laterality: Left;   TUBAL LIGATION  1989    Allergies  Allergies  Allergen Reactions   Hydrocodone-Acetaminophen Itching and Other (See Comments)    confusion   Penicillins Swelling and Other (See Comments)    Has patient had a PCN reaction causing immediate rash, facial/tongue/throat swelling, SOB or lightheadedness with hypotension: YES Has patient had a PCN reaction causing severe rash involving mucus membranes or skin necrosis: NO Has patient had a PCN reaction that required hospitalization NO Has patient had a PCN reaction occurring within the last 10 years: NO If all of the above answers are "NO", then may proceed with Cephalosporin use.   Shellfish Allergy Hives   Latex Rash   Sulfa Antibiotics Diarrhea, Itching and Rash    History of Present Illness    [name] is a 60 year old female with the above-mentioned past medical history presents today for post ED follow-up for chest  discomfort and shortness of breath.  She was seen by [name] in 2008 and underwent normal LHC with normal LV function.  She was also treated with Coumadin in 04/2006 for questionable pulmonary embolism.  Cardiac CTA was completed 01/2010 which showed no PE.  She was seen in the ED in 2012 for complaint of chest pain.  She underwent nuclear stress test with Myoview that was found to be low risk, chest pain was likely ruled musculoskeletal etiology.  Patient had subsequent repeat Myoview for chest pain in 10/2012 that was normal.  She was hospitalized in 05/2014 with chest pain and left-sided numbness and MRI was completed to rule out stroke that was negative. She was seen in the ER for chest pain 09/03/20. She ruled out with negative trops. D dimer was elevated, CTA negative for PE.  She was most recently seen in the ED for complaint of chest pain in 02/2021.  Patient reported throbbing dull pain in center of her chest that was associated with shortness of breath.  CTA was completed that was negative for PE but did reveal possible worsening pericardial effusion.  2D echo was ordered that showed EF of 50-55%, no RWMA, mild LVH, grade 2 DD, with normal bowel function.  Her troponins were negative x4 and EKG had nonspecific ST changes.  Patient had relief with Nitropaste and was referred to GI for possible upper endoscopy. She was seen last in the ED on 11/2020 with complaint of chest pain and shortness of breath thought to be possible CHF exacerbation.  BNP was normal and troponins were negative.  [name] presents today for post ED visit for possible CHF exacerbation.  She reports that she has been feeling well since her visit and denies any current anginal symptoms.  She is euvolemic on examination and has been compliant with all of her current medications.  Her blood pressure today was 108/70 and she reports that they have been on the low side for the last few days.  Patient denies chest pain, palpitations,  dyspnea, PND, orthopnea, nausea, vomiting, dizziness, syncope, edema, weight gain, or early satiety.   Home Medications    Current Outpatient Medications  Medication Sig Dispense Refill   ACCU-CHEK GUIDE test strip      albuterol (PROVENTIL HFA;VENTOLIN HFA) 108 (90 Base) MCG/ACT inhaler Inhale 2 puffs into the lungs every 6 (six) hours as needed for wheezing or shortness of breath.     aspirin 81 MG chewable tablet Chew 81 mg by mouth daily.     atorvastatin (LIPITOR) 40 MG tablet Take 40 mg by mouth at bedtime.  busPIRone (BUSPAR) 30 MG tablet Take 30 mg by mouth 2 (two) times daily.     carvedilol (COREG) 6.25 MG tablet Take 1 tablet (6.25 mg total) by mouth 2 (two) times daily. 180 tablet 3   diclofenac (VOLTAREN) 75 MG EC tablet Take 75 mg by mouth 2 (two) times daily.     diclofenac Sodium (VOLTAREN) 1 % GEL Apply 2 g topically 4 (four) times daily as needed (joint/muscle pain).     EPINEPHrine 0.3 mg/0.3 mL IJ SOAJ injection Inject 0.3 mg into the muscle as needed for anaphylaxis. Follow package instructions as needed for severe allergy or anaphylactic reaction. 2 each 3   furosemide (LASIX) 40 MG tablet Take 60 mg by mouth daily.     hydrOXYzine (VISTARIL) 25 MG capsule Take 25 mg by mouth 3 (three) times daily as needed for itching.     isosorbide dinitrate (ISORDIL) 20 MG tablet Take 20 mg by mouth daily.     lidocaine (LIDODERM) 5 % Place 1 patch onto the skin daily as needed for pain.     lisinopril (PRINIVIL,ZESTRIL) 20 MG tablet Take 20 mg by mouth daily.      loperamide (IMODIUM A-D) 2 MG tablet Take 4 mg by mouth 4 (four) times daily as needed for diarrhea or loose stools.      metFORMIN (GLUCOPHAGE) 500 MG tablet Take 1 tablet (500 mg total) by mouth daily with breakfast. 30 tablet 0   MYRBETRIQ 25 MG TB24 tablet Take 1 tablet (25 mg total) by mouth daily. 30 tablet 0   nystatin (MYCOSTATIN/NYSTOP) powder Apply 1 application topically 3 (three) times daily as needed for  rash.     oxyCODONE (OXY IR/ROXICODONE) 5 MG immediate release tablet Take 1 tablet (5 mg total) by mouth every 6 (six) hours as needed for severe pain. 20 tablet 0   pantoprazole (PROTONIX) 40 MG tablet Take 1 tablet (40 mg total) by mouth 2 (two) times daily. 60 tablet 0   potassium chloride (KLOR-CON M10) 10 MEQ tablet Take 1 tablet (10 mEq total) by mouth 2 (two) times daily. 60 tablet 1   Semaglutide,0.25 or 0.'5MG'$ /DOS, (OZEMPIC, 0.25 OR 0.5 MG/DOSE,) 2 MG/1.5ML SOPN Inject 0.5 mg into the skin once a week. 3 mL 0   sertraline (ZOLOFT) 25 MG tablet Take 1 tablet (25 mg total) by mouth daily. 30 tablet 0   Vitamin D, Ergocalciferol, (DRISDOL) 1.25 MG (50000 UNIT) CAPS capsule Take 1 capsule (50,000 Units total) by mouth every 7 (seven) days. 4 capsule 0   No current facility-administered medications for this visit.     Review of Systems  Please see the history of present illness.    (+) Tiredness (+) Chest discomfort  All other systems reviewed and are otherwise negative except as noted above.  Physical Exam    Wt Readings from Last 3 Encounters:  12/12/21 200 lb 9.6 oz (91 kg)  12/04/21 206 lb (93.4 kg)  11/29/21 202 lb (91.6 kg)   VS: Vitals:   12/12/21 1040  BP: 108/70  Pulse: 73  SpO2: 97%  ,Body mass index is 40.52 kg/m.  Constitutional:      Appearance: Healthy appearance. Not in distress.  Neck:     Vascular: JVD normal.  Pulmonary:     Effort: Pulmonary effort is normal.     Breath sounds: No wheezing. No rales. Diminished in the bases Cardiovascular:     Normal rate. Regular rhythm. Normal S1. Normal S2.  Murmurs: There is no murmur.  Edema:    Peripheral edema absent.  Abdominal:     Palpations: Abdomen is soft non tender. There is no hepatomegaly.  Skin:    General: Skin is warm and dry.  Neurological:     General: No focal deficit present.     Mental Status: Alert and oriented to person, place and time.     Cranial Nerves: Cranial nerves are  intact.  EKG/LABS/Other Studies Reviewed    ECG personally reviewed by me today -none completed today      Lab Results  Component Value Date   WBC 7.6 12/04/2021   HGB 11.4 (L) 12/04/2021   HCT 35.4 (L) 12/04/2021   MCV 89.8 12/04/2021   PLT 287 12/04/2021   Lab Results  Component Value Date   CREATININE 1.06 (H) 12/04/2021   BUN 17 12/04/2021   NA 145 12/04/2021   K 3.6 12/04/2021   CL 110 12/04/2021   CO2 28 12/04/2021   Lab Results  Component Value Date   ALT 34 11/29/2021   AST 26 11/29/2021   ALKPHOS 58 11/29/2021   BILITOT 1.3 (H) 11/29/2021   Lab Results  Component Value Date   CHOL 127 09/28/2021   HDL 48 09/28/2021   LDLCALC 60 09/28/2021   TRIG 99 09/28/2021   CHOLHDL 4.2 04/23/2011   CHOLHDL 4.3 04/23/2011    Lab Results  Component Value Date   HGBA1C 6.4 (H) 09/28/2021    Assessment & Plan    1.  HFpEF: -2D echo completed 02/2021 EF 50-55%, no RWMA, mild LVH with grade 2 DD -Patient was euvolemic on examination today -Continue current GDMT with carvedilol 6.25 mg twice daily, Zestril 20 mg -She has some reports of hypotension and I instructed her to hold her Lasix dose if systolic blood pressure is less than 993 systolically in the morning.  She was instructed to resume Lasix at regular dose if blood pressures are greater than 716 or 967 systolically  2.  Stable angina: -Patient reports today no discomfort currently -Continue Isordil 20 mg daily  3.  Shortness of breath: -Patient is euvolemic on examination today and has no dyspnea or shortness of breath. -Continue GDMT as noted above  4.  HLD: -Patient's last LDL cholesterol was 60 at goal of less than 70. -Continue atorvastatin 40 mg daily    5.  HTN: -Blood pressure today was 108/70 -Continue carvedilol and Zestril as noted above -Patient was advised to hold Lasix as noted above  Disposition: Follow-up with Tara Rouge, MD or APP in 12 months    Medication Adjustments/Labs and  Tests Ordered: Current medicines are reviewed at length with the patient today.  Concerns regarding medicines are outlined above.   Signed, Mable Fill, Marissa Nestle, NP 12/12/2021, 11:15 AM [place]

## 2021-12-12 ENCOUNTER — Encounter: Payer: Self-pay | Admitting: Nurse Practitioner

## 2021-12-12 ENCOUNTER — Ambulatory Visit (INDEPENDENT_AMBULATORY_CARE_PROVIDER_SITE_OTHER): Payer: Medicare Other | Admitting: Nurse Practitioner

## 2021-12-12 VITALS — BP 108/70 | HR 73 | Ht 59.0 in | Wt 200.6 lb

## 2021-12-12 DIAGNOSIS — I5032 Chronic diastolic (congestive) heart failure: Secondary | ICD-10-CM

## 2021-12-12 DIAGNOSIS — R0602 Shortness of breath: Secondary | ICD-10-CM | POA: Diagnosis not present

## 2021-12-12 DIAGNOSIS — I200 Unstable angina: Secondary | ICD-10-CM | POA: Diagnosis not present

## 2021-12-12 DIAGNOSIS — I10 Essential (primary) hypertension: Secondary | ICD-10-CM

## 2021-12-12 NOTE — Patient Instructions (Signed)
Medication Instructions:  Hold Lasix if top number is less than 100 you can restart if top number is 120 and up *If you need a refill on your cardiac medications before your next appointment, please call your pharmacy*   Lab Work: None Ordered   Testing/Procedures: None Ordered   Follow-Up: At [facility], you and your health needs are our priority.  As part of our continuing mission to provide you with exceptional heart care, we have created designated Provider Care Teams.  These Care Teams include your primary Cardiologist (physician) and Advanced Practice Providers (APPs -  Physician Assistants and Nurse Practitioners) who all work together to provide you with the care you need, when you need it.  We recommend signing up for the patient portal called "MyChart".  Sign up information is provided on this After Visit Summary.  MyChart is used to connect with patients for Virtual Visits (Telemedicine).  Patients are able to view lab/test results, encounter notes, upcoming appointments, etc.  Non-urgent messages can be sent to your provider as well.   To learn more about what you can do with MyChart, go to [email or web address].    Your next appointment:   12 month(s)  The format for your next appointment:   In Person  Provider:   [name], MD     Other Instructions   Important Information About Sugar

## 2021-12-19 ENCOUNTER — Ambulatory Visit (INDEPENDENT_AMBULATORY_CARE_PROVIDER_SITE_OTHER): Payer: Medicare Other | Admitting: Family Medicine

## 2021-12-19 ENCOUNTER — Encounter (INDEPENDENT_AMBULATORY_CARE_PROVIDER_SITE_OTHER): Payer: Self-pay | Admitting: Family Medicine

## 2021-12-19 VITALS — BP 113/74 | HR 72 | Temp 98.2°F | Ht 59.0 in | Wt 195.0 lb

## 2021-12-19 DIAGNOSIS — F32A Depression, unspecified: Secondary | ICD-10-CM | POA: Diagnosis not present

## 2021-12-19 DIAGNOSIS — E559 Vitamin D deficiency, unspecified: Secondary | ICD-10-CM

## 2021-12-19 DIAGNOSIS — Z7985 Long-term (current) use of injectable non-insulin antidiabetic drugs: Secondary | ICD-10-CM

## 2021-12-19 DIAGNOSIS — Z6839 Body mass index (BMI) 39.0-39.9, adult: Secondary | ICD-10-CM

## 2021-12-19 DIAGNOSIS — E669 Obesity, unspecified: Secondary | ICD-10-CM

## 2021-12-19 DIAGNOSIS — E1165 Type 2 diabetes mellitus with hyperglycemia: Secondary | ICD-10-CM | POA: Diagnosis not present

## 2021-12-19 DIAGNOSIS — F419 Anxiety disorder, unspecified: Secondary | ICD-10-CM

## 2021-12-19 DIAGNOSIS — Z7984 Long term (current) use of oral hypoglycemic drugs: Secondary | ICD-10-CM

## 2021-12-19 MED ORDER — OZEMPIC (0.25 OR 0.5 MG/DOSE) 2 MG/1.5ML ~~LOC~~ SOPN: 0.5000 mg | PEN_INJECTOR | SUBCUTANEOUS | 0 refills | Status: DC

## 2021-12-19 MED ORDER — VITAMIN D (ERGOCALCIFEROL) 1.25 MG (50000 UNIT) PO CAPS: 50000.0000 [IU] | ORAL_CAPSULE | ORAL | 0 refills | Status: DC

## 2021-12-19 MED ORDER — METFORMIN HCL 500 MG PO TABS
500.0000 mg | ORAL_TABLET | Freq: Every day | ORAL | 0 refills | Status: DC
Start: 2021-12-19 — End: 2022-09-05

## 2021-12-19 NOTE — Progress Notes (Unsigned)
[facility] NOTE  Patient Care Team: Sonia Side., FNP as PCP - General (Family Medicine) Josue Hector, MD as PCP - Cardiology (Cardiology) Mauro Kaufmann, RN as Oncology Nurse Navigator Rockwell Germany, RN as Oncology Nurse Navigator Donnie Mesa, MD as Consulting Physician (General Surgery) [name] Pike, MD as Consulting Physician (Hematology and Oncology) Kyung Rudd, MD as Consulting Physician (Radiation Oncology)  CHIEF COMPLAINTS/PURPOSE OF CONSULTATION:  Newly diagnosed breast cancer  HISTORY OF PRESENTING ILLNESS:  [name] 61 y.o. female is here because of recent diagnosis of right breast IDC  I reviewed her records extensively and collaborated the history with the patient.  SUMMARY OF ONCOLOGIC HISTORY: Oncology History  Malignant neoplasm of upper-outer quadrant of female breast ([place])  10/07/2021 Mammogram   Right breast, possible mass warrants further evaluation, no findings suspicious for malignancy in the left breast.Targeted ultrasound is performed, showing a 8 x 6 x 7 mm irregular mass at the right breast 1 o'clock 13 cm from nipple correlating to the mammographic mass. Ultrasound of the right axilla is negative.   10/27/2021 Pathology Results   Per verbal report at breast [place] today, grade 3 IDC, triple negative, Ki 67 25%   11/02/2021 Initial Diagnosis   Malignant neoplasm of upper-outer quadrant of female breast ([place])   11/21/2021 Genetic Testing   No pathogenic variants detected in [facility] CustomNext-Cancer +RNAinsight Panel.  Report date is November 21, 2021.   The CustomNext-Cancer+RNAinsight panel offered by [facility] includes sequencing and rearrangement analysis for the following 47 genes:  APC, ATM, AXIN2, BARD1, BMPR1A, BRCA1, BRCA2, BRIP1, CDH1, CDK4, CDKN2A, CHEK2, DICER1, EPCAM, GREM1, HOXB13, MEN1, MLH1, MSH2, MSH3, MSH6, MUTYH, NBN, NF1, NF2, NTHL1, PALB2, PMS2, POLD1, POLE, PTEN, RAD51C, RAD51D, RECQL, RET,  SDHA, SDHAF2, SDHB, SDHC, SDHD, SMAD4, SMARCA4, STK11, TP53, TSC1, TSC2, and VHL.  RNA data is routinely analyzed for use in variant interpretation for all genes.   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  Prior prognostic showed ER 0% negative PR 0% negative, HER2 low with IHC of 1+ and Ki-67 of 25%.   12/27/2021 -  Chemotherapy   Patient is on Treatment Plan : BREAST TC q21d      Interval History  She is here for a follow up after surgery. She is here with her daughter.  She appears a little tired and spaced out, she tells me that she did not get much sleep.  She is also on doxycycline for antibiotics, she had a fever after draining her postop seroma. She otherwise denies any complaints.  DM is well controlled, Hb A1c is 6.4%. ECHO 50-55%. Nov 2022. History of PE in 2008, took anticoagulation.  MEDICAL HISTORY:  Past Medical History:  Diagnosis Date   Allergy    Anginal pain ([place])    a. NL cath in 2008;  b. Myoview 03/2011: dec uptake along mid anterior wall on stress imaging -> ? attenuation vs. ischemia, EF 65%;  c. 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  2008 -> coumadin x 6 mos.   Rheumatoid arthritis ([place])    Sleep apnea    on CPAP 02/2018   SOB (shortness of breath)    Thyroid disease    TIA (transient ischemic attack)    Urinary urgency    Vitamin D deficiency     SURGICAL HISTORY: Past Surgical History:  Procedure Laterality Date   ABDOMINAL HYSTERECTOMY  2005   APPENDECTOMY     BREAST LUMPECTOMY WITH RADIOACTIVE SEED AND SENTINEL LYMPH NODE BIOPSY Right 11/16/2021   Procedure: RIGHT BREAST RADIOACTIVE SEED LOCALIZED LUMPECTOMY WITH RIGHT AXILLARY SENTINEL LYMPH NODE BIOPSY;  Surgeon: Donnie Mesa, MD;  Location: [facility];  Service: General;  Laterality: Right;  LMA & PEC BLOCK   CARDIAC CATHETERIZATION     Normal   CARDIAC CATHETERIZATION N/A 09/30/2014   Procedure: Left Heart Cath and Coronary Angiography;  Surgeon: Sherren Mocha, MD;  Location: [facility];  Service: Cardiovascular;  Laterality: N/A;   LAPAROSCOPIC APPENDECTOMY N/A 06/03/2012   Procedure: APPENDECTOMY LAPAROSCOPIC;  Surgeon: Stark Klein, MD;  Location: [facility];  Service: General;  Laterality: N/A;   Left knee surgery  2008   LEG SURGERY     TONSILLECTOMY     TOTAL HIP ARTHROPLASTY Left 06/11/2018   Procedure: LEFT TOTAL HIP ARTHROPLASTY ANTERIOR APPROACH;  Surgeon: Meredith Pel, MD;  Location: [facility];  Service: Orthopedics;  Laterality: Left;   TOTAL KNEE ARTHROPLASTY Left 08/22/2016   Procedure: TOTAL KNEE ARTHROPLASTY;  Surgeon: Meredith Pel, MD;  Location: [facility];  Service: Orthopedics;  Laterality: Left;   TUBAL LIGATION  1989    SOCIAL HISTORY: Social History   Socioeconomic History   Marital status: Divorced    Spouse name: Not on file   Number of children: 3   Years of education: Not on file   Highest education level: Not on file  Occupational History   Occupation: UNEMPLOYED    Employer: DISABLED  Tobacco Use   Smoking status: Former    Packs/day: 0.50    Years: 15.00    Total pack years: 7.50    Types:  Cigarettes    Quit date: 04/24/1985    Years since quitting: 36.6   Smokeless tobacco: Never  Vaping Use   Vaping Use: Never used  Substance and Sexual Activity   Alcohol use: No    Alcohol/week: 0.0 standard drinks of alcohol   Drug use: Not Currently    Types: Marijuana   Sexual activity: Not Currently    Birth control/protection: Surgical    Comment: hyst  Other Topics Concern   Not on file  Social History Narrative   Lives in [place] by herself.  Disabled.  Caffeine 1 cup avg daily.  3 kids, 6 grandkids.     Water aerobics 3 times a  week.   Social Determinants of Health   Financial Resource Strain: Medium Risk (11/02/2021)   Overall Financial Resource Strain (CARDIA)    Difficulty of Paying Living Expenses: Somewhat hard  Food Insecurity: Unknown (11/02/2021)   Hunger Vital Sign    Worried About Running Out of Food in the Last Year: Not on file    Ran Out of Food in the Last Year: Never true  Transportation Needs: No Transportation Needs (11/02/2021)   PRAPARE - Hydrologist (Medical): No    Lack of Transportation (Non-Medical): No  Physical Activity: Not on file  Stress: Not on file  Social Connections: Not on file  Intimate Partner Violence: Not on file    FAMILY HISTORY: Family History  Problem Relation Age of Onset   Emphysema Mother    Arthritis Mother    Heart failure Mother        alive @ 22   Stroke Mother    Diabetes Mother    Hypertension Mother    Hyperlipidemia Mother    Depression Mother    Anxiety disorder Mother    Heart disease Mother    Alcoholism Mother    Depression Father    Heart disease Father        died @ 32's.   Stroke Father    Diabetes Father    Hyperlipidemia Father    Hypertension Father    Bipolar disorder Father    Sleep apnea Father    Alcoholism Father    Drug abuse Father    Sudden death Father    Diabetes Sister    Asthma Brother    Breast cancer Maternal Aunt 61 - 73   Colon cancer Maternal  Grandfather        dx after 48   Heart disease Paternal Grandfather    Breast cancer Cousin        dx 43s   Esophageal cancer Neg Hx    Stomach cancer Neg Hx    Rectal cancer Neg Hx     ALLERGIES:  is allergic to hydrocodone-acetaminophen, penicillins, shellfish allergy, latex, and sulfa antibiotics.  MEDICATIONS:  Current Outpatient Medications  Medication Sig Dispense Refill   ACCU-CHEK GUIDE test strip      albuterol (PROVENTIL HFA;VENTOLIN HFA) 108 (90 Base) MCG/ACT inhaler Inhale 2 puffs into the lungs every 6 (six) hours as needed for wheezing or shortness of breath.     aspirin 81 MG chewable tablet Chew 81 mg by mouth daily.     atorvastatin (LIPITOR) 40 MG tablet Take 40 mg by mouth at bedtime.      busPIRone (BUSPAR) 30 MG tablet Take 30 mg by mouth 2 (two) times daily.     carvedilol (COREG) 6.25 MG tablet Take 1 tablet (6.25 mg total) by mouth 2 (two) times daily. 180 tablet 3   dexamethasone (DECADRON) 4 MG tablet Take 2 tabs by mouth 2 times daily starting day before chemo. Then take 2 tabs daily for 2 days starting day after chemo. Take with food. 30 tablet 1   diclofenac (VOLTAREN) 75 MG EC tablet Take 75 mg by mouth 2 (two) times daily.     diclofenac Sodium (VOLTAREN) 1 % GEL Apply 2 g topically 4 (four) times daily as needed (joint/muscle pain).     EPINEPHrine 0.3 mg/0.3 mL IJ SOAJ injection Inject 0.3 mg into the muscle as needed for anaphylaxis. Follow package instructions as needed for severe allergy or anaphylactic reaction. 2 each 3  furosemide (LASIX) 40 MG tablet Take 60 mg by mouth daily.     hydrOXYzine (VISTARIL) 25 MG capsule Take 25 mg by mouth 3 (three) times daily as needed for itching.     isosorbide dinitrate (ISORDIL) 20 MG tablet Take 20 mg by mouth daily.     lidocaine (LIDODERM) 5 % Place 1 patch onto the skin daily as needed for pain.     lidocaine-prilocaine (EMLA) cream Apply to affected area once 30 g 3   lisinopril (PRINIVIL,ZESTRIL) 20 MG  tablet Take 20 mg by mouth daily.      loperamide (IMODIUM A-D) 2 MG tablet Take 4 mg by mouth 4 (four) times daily as needed for diarrhea or loose stools.      metFORMIN (GLUCOPHAGE) 500 MG tablet Take 1 tablet (500 mg total) by mouth daily with breakfast. 90 tablet 0   MYRBETRIQ 25 MG TB24 tablet Take 1 tablet (25 mg total) by mouth daily. 30 tablet 0   nystatin (MYCOSTATIN/NYSTOP) powder Apply 1 application topically 3 (three) times daily as needed for rash.     ondansetron (ZOFRAN) 8 MG tablet Take 1 tablet (8 mg total) by mouth every 8 (eight) hours as needed for nausea or vomiting. Start on the third day after chemotherapy. 30 tablet 1   oxyCODONE (OXY IR/ROXICODONE) 5 MG immediate release tablet Take 1 tablet (5 mg total) by mouth every 6 (six) hours as needed for severe pain. 20 tablet 0   pantoprazole (PROTONIX) 40 MG tablet Take 1 tablet (40 mg total) by mouth 2 (two) times daily. 60 tablet 0   potassium chloride (KLOR-CON M10) 10 MEQ tablet Take 1 tablet (10 mEq total) by mouth 2 (two) times daily. 60 tablet 1   prochlorperazine (COMPAZINE) 10 MG tablet Take 1 tablet (10 mg total) by mouth every 6 (six) hours as needed for nausea or vomiting. 30 tablet 1   Semaglutide,0.25 or 0.5MG/DOS, (OZEMPIC, 0.25 OR 0.5 MG/DOSE,) 2 MG/1.5ML SOPN Inject 0.5 mg into the skin once a week. 3 mL 0   sertraline (ZOLOFT) 25 MG tablet Take 1 tablet (25 mg total) by mouth daily. 30 tablet 0   Vitamin D, Ergocalciferol, (DRISDOL) 1.25 MG (50000 UNIT) CAPS capsule Take 1 capsule (50,000 Units total) by mouth every 7 (seven) days. 4 capsule 0   No current facility-administered medications for this visit.    PHYSICAL EXAMINATION: ECOG PERFORMANCE STATUS: 1 - Symptomatic but completely ambulatory  Vitals:   12/20/21 1108  BP: 117/74  Pulse: 76  Resp: 18  Temp: 97.9 F (36.6 C)  SpO2: 98%    Filed Weights   12/20/21 1108  Weight: 200 lb 1.6 oz (90.8 kg)     GENERAL:alert, no distress and  comfortable  Right breast incision appears to have healed well.  LABORATORY DATA:  I have reviewed the data as listed Lab Results  Component Value Date   WBC 7.6 12/04/2021   HGB 11.4 (L) 12/04/2021   HCT 35.4 (L) 12/04/2021   MCV 89.8 12/04/2021   PLT 287 12/04/2021   Lab Results  Component Value Date   NA 145 12/04/2021   K 3.6 12/04/2021   CL 110 12/04/2021   CO2 28 12/04/2021    RADIOGRAPHIC STUDIES: I have personally reviewed the radiological reports and agreed with the findings in the report.  ASSESSMENT AND PLAN:   Malignant neoplasm of upper-outer quadrant of female breast [facility]) This is a very pleasant 62 year old female patient with past medical history significant for  congestive heart failure, coronary artery disease, diabetes, hypertension, dyslipidemia history of pulmonary embolism back in 2008 for which she needed anticoagulation, rheumatoid arthritis, obstructive sleep apnea who is newly diagnosed with right breast IDC noticed on screening mammogram.  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  She is here postlumpectomy to discuss adjuvant recommendations.  I once again discussed about considering adjuvant docetaxel and cyclophosphamide every 21 days for 4 cycles.  Since last visit, she has recovered from Recent infection.  She is not on any more antibiotics.  She had a follow-up visit with Dr. Georgette Dover.  We once again discussed role of adjuvant chemotherapy to reduce risk of recurrence.  We have discussed about adverse effects once again including but not limited to fatigue, nausea, vomiting, diarrhea, increased risk of infections, neuropathy, secondary malignancies, hair loss.  We have talked about cold cap but patient refused it at this time.  We have discussed about the nausea study and SWOG study to minimize the risk of neuropathy.  She will proceed with chemotherapy as recommended.  Chemo education to be  scheduled.  Treatment plan placed in chart. Return to clinic before planned cycle 2 of chemotherapy.  She had several questions today for me and all her questions were answered to the best my knowledge.  Total time spent: 40 minutes including history, physical exam, review of records, counseling and coordination of care All questions were answered. The patient knows to call the clinic with any problems, questions or concerns.    [name] Pike, MD 12/20/21

## 2021-12-20 ENCOUNTER — Encounter: Payer: Self-pay | Admitting: Hematology and Oncology

## 2021-12-20 ENCOUNTER — Other Ambulatory Visit: Payer: Self-pay

## 2021-12-20 ENCOUNTER — Inpatient Hospital Stay (HOSPITAL_BASED_OUTPATIENT_CLINIC_OR_DEPARTMENT_OTHER): Payer: Medicare Other | Admitting: Hematology and Oncology

## 2021-12-20 VITALS — BP 117/74 | HR 76 | Temp 97.9°F | Resp 18 | Ht 59.0 in | Wt 200.1 lb

## 2021-12-20 DIAGNOSIS — C50411 Malignant neoplasm of upper-outer quadrant of right female breast: Secondary | ICD-10-CM

## 2021-12-20 DIAGNOSIS — Z171 Estrogen receptor negative status [ER-]: Secondary | ICD-10-CM | POA: Diagnosis not present

## 2021-12-20 MED ORDER — PROCHLORPERAZINE MALEATE 10 MG PO TABS: 10.0000 mg | ORAL_TABLET | Freq: Four times a day (QID) | ORAL | 1 refills | Status: DC | PRN

## 2021-12-20 MED ORDER — ONDANSETRON HCL 8 MG PO TABS: 8.0000 mg | ORAL_TABLET | Freq: Three times a day (TID) | ORAL | 1 refills | Status: DC | PRN

## 2021-12-20 MED ORDER — LIDOCAINE-PRILOCAINE 2.5-2.5 % EX CREA: TOPICAL_CREAM | CUTANEOUS | 3 refills | Status: DC

## 2021-12-20 MED ORDER — DEXAMETHASONE 4 MG PO TABS: ORAL_TABLET | ORAL | 1 refills | Status: DC

## 2021-12-20 NOTE — Research (Signed)
[id number] - TREATMENT OF REFRACTORY NAUSEA  S2205, ICE COMPRESS: RANDOMIZED TRIAL OF LIMB CRYOCOMPRESSION VERSUS CONTINUOUS COMPRESSION VERSUS LOW CYCLIC COMPRESSION FOR THE PREVENTION  OF TAXANE-INDUCED PERIPHERAL NEUROPATHY  Patient referred by Dr Chryl Heck for above-listed studies. I contacted patient by phone to introduce both studies; patient stated she would like more information. Will mail patients consent forms for both studies per request, and follow-up later this week. Patient has my direct contact information in case any questions or concerns arise.  Vickii Penna, RN, BSN, CPN Clinical Research Nurse I [phone]  12/20/2021 1:58 PM

## 2021-12-20 NOTE — Progress Notes (Signed)
START ON PATHWAY REGIMEN - Breast ? ? ?  A cycle is every 21 days: ?    Docetaxel  ?    Cyclophosphamide  ? ?**Always confirm dose/schedule in your pharmacy ordering system** ? ?Patient Characteristics: ?Postoperative without Neoadjuvant Therapy (Pathologic Staging), Invasive Disease, Adjuvant Therapy, HER2 Negative/Unknown/Equivocal, ER Negative/Unknown, Node Negative, pT1a-b, N0, Chemotherapy Indicated ?Therapeutic Status: Postoperative without Neoadjuvant Therapy (Pathologic Staging) ?AJCC Grade: G3 ?AJCC N Category: pN0 ?AJCC M Category: cM0 ?ER Status: Negative (-) ?AJCC 8 Stage Grouping: IB ?HER2 Status: Negative (-) ?Oncotype Dx Recurrence Score: Not Appropriate ?AJCC T Category: pT1 ?PR Status: Negative (-) ?Adjuvant Therapy Status: No Adjuvant Therapy Received Yet or Changing Initial Adjuvant Regimen due to Tolerance ?Intervention Indicated: Chemotherapy ?Intent of Therapy: ?Curative Intent, Discussed with Patient ?

## 2021-12-20 NOTE — Assessment & Plan Note (Signed)
This is a very pleasant 61 year old female patient with past medical history significant for congestive heart failure, coronary artery disease, diabetes, hypertension, dyslipidemia history of pulmonary embolism back in 2008 for which she needed anticoagulation, rheumatoid arthritis, obstructive sleep apnea who is newly diagnosed with right breast IDC noticed on screening mammogram.  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  She is here postlumpectomy to discuss adjuvant recommendations.  I once again discussed about considering adjuvant docetaxel and cyclophosphamide every 21 days for 4 cycles.  Since last visit, she has recovered from Recent infection.  She is not on any more antibiotics.  She had a follow-up visit with Dr. Georgette Dover.  We once again discussed role of adjuvant chemotherapy to reduce risk of recurrence.  We have discussed about adverse effects once again including but not limited to fatigue, nausea, vomiting, diarrhea, increased risk of infections, neuropathy, secondary malignancies, hair loss.  We have talked about cold cap but patient refused it at this time.  We have discussed about the nausea study and SWOG study to minimize the risk of neuropathy.  She will proceed with chemotherapy as recommended.  Chemo education to be scheduled.  Treatment plan placed in chart. Return to clinic before planned cycle 2 of chemotherapy.  She had several questions today for me and all her questions were answered to the best my knowledge.

## 2021-12-21 ENCOUNTER — Other Ambulatory Visit: Payer: Self-pay

## 2021-12-21 ENCOUNTER — Encounter: Payer: Self-pay | Admitting: *Deleted

## 2021-12-21 ENCOUNTER — Inpatient Hospital Stay: Payer: Medicare Other

## 2021-12-22 ENCOUNTER — Encounter: Payer: Self-pay | Admitting: *Deleted

## 2021-12-22 NOTE — Progress Notes (Signed)
Pharmacist Chemotherapy Monitoring - Initial Assessment    Anticipated start date: 12/30/21   The following has been reviewed per standard work regarding the patient's treatment regimen: The patient's diagnosis, treatment plan and drug doses, and organ/hematologic function Lab orders and baseline tests specific to treatment regimen  The treatment plan start date, drug sequencing, and pre-medications Prior authorization status  Patient's documented medication list, including drug-drug interaction screen and prescriptions for anti-emetics and supportive care specific to the treatment regimen The drug concentrations, fluid compatibility, administration routes, and timing of the medications to be used The patient's access for treatment and lifetime cumulative dose history, if applicable  The patient's medication allergies and previous infusion related reactions, if applicable   Changes made to treatment plan:  treatment plan date  Follow up needed:  N/A   Kennith Center, Pharm.D., CPP 12/22/2021'@3'$ :41 PM

## 2021-12-23 ENCOUNTER — Telehealth: Payer: Self-pay

## 2021-12-23 NOTE — Telephone Encounter (Signed)
[id number] - TREATMENT OF REFRACTORY NAUSEA  S2205, ICE COMPRESS: RANDOMIZED TRIAL OF LIMB CRYOCOMPRESSION VERSUS CONTINUOUS COMPRESSION VERSUS LOW CYCLIC COMPRESSION FOR THE PREVENTION  OF TAXANE-INDUCED PERIPHERAL NEUROPATHY  Called patient to follow-up on interest in above-listed studies. Consent forms had not arrived via mail as of this morning. Informed patient that she is eligible for the [facility] nausea study, but is not eligible for the S2205 neuropathy trial because she is not getting one of the study-approved chemotherapy regimens. Patient verbalized understanding. Will alert treating investigator. Will follow-up with patient about URCC nausea study next week after she has had time to review the consents.  Vickii Penna, RN, BSN, CPN Clinical Research Nurse I [phone]  12/23/2021 10:13 AM

## 2021-12-24 NOTE — Progress Notes (Signed)
Chief Complaint:   OBESITY [name] is here to discuss her progress with her obesity treatment plan along with follow-up of her obesity related diagnoses. [name] is on the Category 2 Plan and states she is following her eating plan approximately 75% of the time. [name] states she is doing some walking.   Today's visit was #: 9 Starting weight: 209 lbs Starting date: 06/01/2021 Today's weight: 195 lbs Today's date: 12/19/2021 Total lbs lost to date: 14 Total lbs lost since last in-office visit: 3  Interim History: [name] has had a very complex last month with repeated ED visits and follow up appointments for seroma accumulation and then reaccumulation. She is feeling stressed and overwhelmed with current treatment plan. [name] hasn't been as able to eat according to plan as she would have liked. She recognizes an increase in fatigue and sleep with a decrease in motivation.  Subjective:   1. Vitamin D deficiency She is currently taking prescription vitamin D 50,000 IU each week. Her last vitamin D level was 39.8. She notes fatigue and denies nausea, vomiting, or muscle weakness.  2. Type 2 diabetes mellitus with hyperglycemia, without long-term current use of insulin (HCC) [name]'s last A1c was 6.4. She is on Ozempic and Glucophage. [name] will be starting chemo in the next few weeks.  3. Anxiety and depression [name] was given a prescription of sertraline '25mg'$  in early August. She is about to start chemo.  Assessment/Plan:   1. Vitamin D deficiency Low Vitamin D level contributes to fatigue and are associated with obesity, breast, and colon cancer. She agrees to continue to take prescription Vitamin D '@50'$ ,000 IU every week and will follow-up for routine testing of Vitamin D, at least 2-3 times per year to avoid over-replacement.  - Vitamin D, Ergocalciferol, (DRISDOL) 1.25 MG (50000 UNIT) CAPS capsule; Take 1 capsule (50,000 Units total) by mouth every 7 (seven) days.  Dispense: 4  capsule; Refill: 0  2. Type 2 diabetes mellitus with hyperglycemia, without long-term current use of insulin (HCC) [name] agrees to continue taking Glucophage and Ozempic. Good blood sugar control is important to decrease the likelihood of diabetic complications such as nephropathy, neuropathy, limb loss, blindness, coronary artery disease, and death. Intensive lifestyle modification including diet, exercise and weight loss are the first line of treatment for diabetes.   - Semaglutide,0.25 or 0.'5MG'$ /DOS, (OZEMPIC, 0.25 OR 0.5 MG/DOSE,) 2 MG/1.5ML SOPN; Inject 0.5 mg into the skin once a week.  Dispense: 3 mL; Refill: 0 - metFORMIN (GLUCOPHAGE) 500 MG tablet; Take 1 tablet (500 mg total) by mouth daily with breakfast.  Dispense: 90 tablet; Refill: 0  3. Anxiety and depression [name] was told that she can increase sertraline to '50mg'$  daily of she needs to and to follow up as directed.  4. Obesity, current BMI 39.4 [name] is currently in the action stage of change. As such, her goal is to continue with weight loss efforts. She has agreed to the Category 2 Plan.   Exercise goals: All adults should avoid inactivity. Some physical activity is better than none, and adults who participate in any amount of physical activity gain some health benefits.  Behavioral modification strategies: increasing lean protein intake, meal planning and cooking strategies, keeping healthy foods in the home, and planning for success.  [name] has agreed to follow-up with our clinic in 3 to 4 weeks. She was informed of the importance of frequent follow-up visits to maximize her success with intensive lifestyle modifications for her multiple health conditions.  Objective:   Blood pressure 113/74, pulse 72, temperature 98.2 F (36.8 C), height '4\' 11"'$  (1.499 m), weight 195 lb (88.5 kg), SpO2 99 %. Body mass index is 39.39 kg/m.  General: Cooperative, alert, well developed, in no acute distress. HEENT: Conjunctivae and  lids unremarkable. Cardiovascular: Regular rhythm.  Lungs: Normal work of breathing. Neurologic: No focal deficits.   Lab Results  Component Value Date   CREATININE 1.06 (H) 12/04/2021   BUN 17 12/04/2021   NA 145 12/04/2021   K 3.6 12/04/2021   CL 110 12/04/2021   CO2 28 12/04/2021   Lab Results  Component Value Date   ALT 34 11/29/2021   AST 26 11/29/2021   ALKPHOS 58 11/29/2021   BILITOT 1.3 (H) 11/29/2021   Lab Results  Component Value Date   HGBA1C 6.4 (H) 09/28/2021   HGBA1C 8.4 (H) 03/08/2021   HGBA1C 7.6 (H) 02/03/2020   HGBA1C 7.5 (H) 10/07/2019   HGBA1C 6.3 (H) 12/26/2018   Lab Results  Component Value Date   INSULIN 22.1 09/28/2021   INSULIN 26.8 (H) 06/01/2021   INSULIN 44.4 (H) 02/03/2020   INSULIN 28.1 (H) 12/26/2018   INSULIN 20.2 03/04/2018   Lab Results  Component Value Date   TSH 0.506 04/23/2021   Lab Results  Component Value Date   CHOL 127 09/28/2021   HDL 48 09/28/2021   LDLCALC 60 09/28/2021   TRIG 99 09/28/2021   CHOLHDL 4.2 04/23/2011   CHOLHDL 4.3 04/23/2011   Lab Results  Component Value Date   VD25OH 39.8 09/28/2021   VD25OH 30.0 06/01/2021   VD25OH 44.6 02/03/2020   Lab Results  Component Value Date   WBC 7.6 12/04/2021   HGB 11.4 (L) 12/04/2021   HCT 35.4 (L) 12/04/2021   MCV 89.8 12/04/2021   PLT 287 12/04/2021   No results found for: "IRON", "TIBC", "FERRITIN"  Obesity Behavioral Intervention:   Approximately 15 minutes were spent on the discussion below.  ASK: We discussed the diagnosis of obesity with [name] today and [name] agreed to give [name] permission to discuss obesity behavioral modification therapy today.  ASSESS: [name] has the diagnosis of obesity and her BMI today is 39.4. [name] is in the action stage of change.   ADVISE: [name] was educated on the multiple health risks of obesity as well as the benefit of weight loss to improve her health. She was advised of the need for long term treatment and  the importance of lifestyle modifications to improve her current health and to decrease her risk of future health problems.  AGREE: Multiple dietary modification options and treatment options were discussed and [name] agreed to follow the recommendations documented in the above note.  ARRANGE: [name] was educated on the importance of frequent visits to treat obesity as outlined per CMS and USPSTF guidelines and agreed to schedule her next follow up appointment today.  Attestation Statements:   Reviewed by clinician on day of visit: allergies, medications, problem list, medical history, surgical history, family history, social history, and previous encounter notes.  IMarcille Blanco, CMA, am acting as transcriptionist for Coralie Common, MD  I have reviewed the above documentation for accuracy and completeness, and I agree with the above. - Coralie Common, MD

## 2021-12-27 ENCOUNTER — Telehealth: Payer: Self-pay

## 2021-12-27 NOTE — Telephone Encounter (Signed)
[id number] - TREATMENT OF REFRACTORY NAUSEA  Called and LVM for patient to follow-up on interest in above-listed study. Left call-back information. Will continue to follow-up.  Vickii Penna, RN, BSN, CPN Clinical Research Nurse I [phone]  12/27/2021 9:46 AM

## 2021-12-30 ENCOUNTER — Inpatient Hospital Stay: Payer: Medicare Other | Attending: Hematology and Oncology | Admitting: Hematology and Oncology

## 2021-12-30 ENCOUNTER — Telehealth: Payer: Self-pay | Admitting: *Deleted

## 2021-12-30 ENCOUNTER — Encounter: Payer: Self-pay | Admitting: *Deleted

## 2021-12-30 ENCOUNTER — Other Ambulatory Visit: Payer: Self-pay

## 2021-12-30 ENCOUNTER — Inpatient Hospital Stay: Payer: Medicare Other

## 2021-12-30 VITALS — BP 152/83 | HR 66 | Temp 98.0°F | Resp 16 | Ht 59.0 in | Wt 203.0 lb

## 2021-12-30 DIAGNOSIS — I509 Heart failure, unspecified: Secondary | ICD-10-CM | POA: Diagnosis not present

## 2021-12-30 DIAGNOSIS — Z9049 Acquired absence of other specified parts of digestive tract: Secondary | ICD-10-CM | POA: Diagnosis not present

## 2021-12-30 DIAGNOSIS — Z171 Estrogen receptor negative status [ER-]: Secondary | ICD-10-CM | POA: Diagnosis not present

## 2021-12-30 DIAGNOSIS — Z818 Family history of other mental and behavioral disorders: Secondary | ICD-10-CM | POA: Insufficient documentation

## 2021-12-30 DIAGNOSIS — Z5189 Encounter for other specified aftercare: Secondary | ICD-10-CM | POA: Diagnosis not present

## 2021-12-30 DIAGNOSIS — R202 Paresthesia of skin: Secondary | ICD-10-CM | POA: Diagnosis not present

## 2021-12-30 DIAGNOSIS — R200 Anesthesia of skin: Secondary | ICD-10-CM | POA: Insufficient documentation

## 2021-12-30 DIAGNOSIS — Z833 Family history of diabetes mellitus: Secondary | ICD-10-CM | POA: Insufficient documentation

## 2021-12-30 DIAGNOSIS — Z885 Allergy status to narcotic agent status: Secondary | ICD-10-CM | POA: Insufficient documentation

## 2021-12-30 DIAGNOSIS — Z8249 Family history of ischemic heart disease and other diseases of the circulatory system: Secondary | ICD-10-CM | POA: Insufficient documentation

## 2021-12-30 DIAGNOSIS — Z882 Allergy status to sulfonamides status: Secondary | ICD-10-CM | POA: Insufficient documentation

## 2021-12-30 DIAGNOSIS — E079 Disorder of thyroid, unspecified: Secondary | ICD-10-CM | POA: Diagnosis not present

## 2021-12-30 DIAGNOSIS — Z87891 Personal history of nicotine dependence: Secondary | ICD-10-CM | POA: Insufficient documentation

## 2021-12-30 DIAGNOSIS — Z8349 Family history of other endocrine, nutritional and metabolic diseases: Secondary | ICD-10-CM | POA: Insufficient documentation

## 2021-12-30 DIAGNOSIS — F419 Anxiety disorder, unspecified: Secondary | ICD-10-CM | POA: Diagnosis not present

## 2021-12-30 DIAGNOSIS — Z800 Family history of malignant neoplasm of digestive organs: Secondary | ICD-10-CM | POA: Insufficient documentation

## 2021-12-30 DIAGNOSIS — Z836 Family history of other diseases of the respiratory system: Secondary | ICD-10-CM | POA: Insufficient documentation

## 2021-12-30 DIAGNOSIS — Z823 Family history of stroke: Secondary | ICD-10-CM | POA: Insufficient documentation

## 2021-12-30 DIAGNOSIS — I2510 Atherosclerotic heart disease of native coronary artery without angina pectoris: Secondary | ICD-10-CM | POA: Diagnosis not present

## 2021-12-30 DIAGNOSIS — Z880 Allergy status to penicillin: Secondary | ICD-10-CM | POA: Insufficient documentation

## 2021-12-30 DIAGNOSIS — E119 Type 2 diabetes mellitus without complications: Secondary | ICD-10-CM | POA: Insufficient documentation

## 2021-12-30 DIAGNOSIS — C50411 Malignant neoplasm of upper-outer quadrant of right female breast: Secondary | ICD-10-CM

## 2021-12-30 DIAGNOSIS — Z8673 Personal history of transient ischemic attack (TIA), and cerebral infarction without residual deficits: Secondary | ICD-10-CM | POA: Diagnosis not present

## 2021-12-30 DIAGNOSIS — Z86711 Personal history of pulmonary embolism: Secondary | ICD-10-CM | POA: Diagnosis not present

## 2021-12-30 DIAGNOSIS — Z5986 Financial insecurity: Secondary | ICD-10-CM | POA: Diagnosis not present

## 2021-12-30 DIAGNOSIS — Z811 Family history of alcohol abuse and dependence: Secondary | ICD-10-CM | POA: Insufficient documentation

## 2021-12-30 DIAGNOSIS — F32A Depression, unspecified: Secondary | ICD-10-CM | POA: Diagnosis not present

## 2021-12-30 DIAGNOSIS — Z79899 Other long term (current) drug therapy: Secondary | ICD-10-CM | POA: Diagnosis not present

## 2021-12-30 DIAGNOSIS — Z5111 Encounter for antineoplastic chemotherapy: Secondary | ICD-10-CM | POA: Diagnosis present

## 2021-12-30 DIAGNOSIS — Z803 Family history of malignant neoplasm of breast: Secondary | ICD-10-CM | POA: Insufficient documentation

## 2021-12-30 DIAGNOSIS — N6312 Unspecified lump in the right breast, upper inner quadrant: Secondary | ICD-10-CM | POA: Diagnosis not present

## 2021-12-30 DIAGNOSIS — R519 Headache, unspecified: Secondary | ICD-10-CM | POA: Insufficient documentation

## 2021-12-30 DIAGNOSIS — Z8261 Family history of arthritis: Secondary | ICD-10-CM | POA: Insufficient documentation

## 2021-12-30 LAB — CBC WITH DIFFERENTIAL (CANCER CENTER ONLY)
Abs Immature Granulocytes: 0.02 10*3/uL (ref 0.00–0.07)
Basophils Absolute: 0 10*3/uL (ref 0.0–0.1)
Basophils Relative: 0 %
Eosinophils Absolute: 0 10*3/uL (ref 0.0–0.5)
Eosinophils Relative: 0 %
HCT: 37.3 % (ref 36.0–46.0)
Hemoglobin: 12.5 g/dL (ref 12.0–15.0)
Immature Granulocytes: 0 %
Lymphocytes Relative: 19 %
Lymphs Abs: 1.3 10*3/uL (ref 0.7–4.0)
MCH: 28.9 pg (ref 26.0–34.0)
MCHC: 33.5 g/dL (ref 30.0–36.0)
MCV: 86.3 fL (ref 80.0–100.0)
Monocytes Absolute: 0.3 10*3/uL (ref 0.1–1.0)
Monocytes Relative: 4 %
Neutro Abs: 5.3 10*3/uL (ref 1.7–7.7)
Neutrophils Relative %: 77 %
Platelet Count: 197 10*3/uL (ref 150–400)
RBC: 4.32 MIL/uL (ref 3.87–5.11)
RDW: 13.2 % (ref 11.5–15.5)
WBC Count: 6.9 10*3/uL (ref 4.0–10.5)
nRBC: 0 % (ref 0.0–0.2)

## 2021-12-30 LAB — CMP (CANCER CENTER ONLY)
ALT: 18 U/L (ref 0–44)
AST: 14 U/L — ABNORMAL LOW (ref 15–41)
Albumin: 4.1 g/dL (ref 3.5–5.0)
Alkaline Phosphatase: 55 U/L (ref 38–126)
Anion gap: 7 (ref 5–15)
BUN: 17 mg/dL (ref 8–23)
CO2: 25 mmol/L (ref 22–32)
Calcium: 9.7 mg/dL (ref 8.9–10.3)
Chloride: 107 mmol/L (ref 98–111)
Creatinine: 0.96 mg/dL (ref 0.44–1.00)
GFR, Estimated: >60 mL/min (ref >60)
Glucose, Bld: 210 mg/dL — ABNORMAL HIGH (ref 70–99)
Potassium: 3.4 mmol/L — ABNORMAL LOW (ref 3.5–5.1)
Sodium: 139 mmol/L (ref 135–145)
Total Bilirubin: 0.4 mg/dL (ref 0.3–1.2)
Total Protein: 7.1 g/dL (ref 6.5–8.1)

## 2021-12-30 MED ORDER — SODIUM CHLORIDE 0.9 % IV SOLN
600.0000 mg/m2 | Freq: Once | INTRAVENOUS | Status: AC
  Administered 2021-12-30: 1160 mg via INTRAVENOUS
  Filled 2021-12-30: qty 58

## 2021-12-30 MED ORDER — SODIUM CHLORIDE 0.9 % IV SOLN
10.0000 mg | Freq: Once | INTRAVENOUS | Status: AC
  Administered 2021-12-30: 10 mg via INTRAVENOUS
  Filled 2021-12-30: qty 10

## 2021-12-30 MED ORDER — SODIUM CHLORIDE 0.9% FLUSH: 10.0000 mL | INTRAVENOUS | Status: DC | PRN

## 2021-12-30 MED ORDER — SODIUM CHLORIDE 0.9 % IV SOLN: Freq: Once | INTRAVENOUS | Status: AC

## 2021-12-30 MED ORDER — PALONOSETRON HCL INJECTION 0.25 MG/5ML
0.2500 mg | Freq: Once | INTRAVENOUS | Status: AC
  Administered 2021-12-30: 0.25 mg via INTRAVENOUS
  Filled 2021-12-30: qty 5

## 2021-12-30 MED ORDER — SODIUM CHLORIDE 0.9 % IV SOLN
75.0000 mg/m2 | Freq: Once | INTRAVENOUS | Status: AC
  Administered 2021-12-30: 150 mg via INTRAVENOUS
  Filled 2021-12-30: qty 15

## 2021-12-30 MED ORDER — HEPARIN SOD (PORK) LOCK FLUSH 100 UNIT/ML IV SOLN: 500.0000 [IU] | Freq: Once | INTRAVENOUS | Status: DC | PRN

## 2021-12-30 NOTE — Patient Instructions (Signed)
[facility]  Discharge Instructions: Thank you for choosing [facility] to provide your oncology and hematology care.   If you have a lab appointment with the [facility], please go directly to the [facility] and check in at the registration area.   Wear comfortable clothing and clothing appropriate for easy access to any Portacath or PICC line.   We strive to give you quality time with your provider. You may need to reschedule your appointment if you arrive late (15 or more minutes).  Arriving late affects you and other patients whose appointments are after yours.  Also, if you miss three or more appointments without notifying the office, you may be dismissed from the clinic at the provider's discretion.      For prescription refill requests, have your pharmacy contact our office and allow 72 hours for refills to be completed.    Today you received the following chemotherapy and/or immunotherapy agents: Docetaxel (Taxotere) and Cyclophosphamide (Cytoxan)   To help prevent nausea and vomiting after your treatment, we encourage you to take your nausea medication as directed.  BELOW ARE SYMPTOMS THAT SHOULD BE REPORTED IMMEDIATELY: *FEVER GREATER THAN 100.4 F (38 C) OR HIGHER *CHILLS OR SWEATING *NAUSEA AND VOMITING THAT IS NOT CONTROLLED WITH YOUR NAUSEA MEDICATION *UNUSUAL SHORTNESS OF BREATH *UNUSUAL BRUISING OR BLEEDING *URINARY PROBLEMS (pain or burning when urinating, or frequent urination) *BOWEL PROBLEMS (unusual diarrhea, constipation, pain near the anus) TENDERNESS IN MOUTH AND THROAT WITH OR WITHOUT PRESENCE OF ULCERS (sore throat, sores in mouth, or a toothache) UNUSUAL RASH, SWELLING OR PAIN  UNUSUAL VAGINAL DISCHARGE OR ITCHING   Items with * indicate a potential emergency and should be followed up as soon as possible or go to the Emergency Department if any problems should occur.  Please show the CHEMOTHERAPY ALERT CARD or  IMMUNOTHERAPY ALERT CARD at check-in to the Emergency Department and triage nurse.  Should you have questions after your visit or need to cancel or reschedule your appointment, please contact [facility]  Dept: [phone]  and follow the prompts.  Office hours are 8:00 a.m. to 4:30 p.m. Monday - Friday. Please note that voicemails left after 4:00 p.m. may not be returned until the following business day.  We are closed weekends and major holidays. You have access to a nurse at all times for urgent questions. Please call the main number to the clinic Dept: [phone] and follow the prompts.   For any non-urgent questions, you may also contact your provider using MyChart. We now offer e-Visits for anyone 64 and older to request care online for non-urgent symptoms. For details visit [email or web address].   Also download the MyChart app! Go to the app store, search "MyChart", open the app, select [facility], and log in with your MyChart username and password.  Masks are optional in the cancer centers. If you would like for your care team to wear a mask while they are taking care of you, please let them know. You may have one support person who is at least 62 years old accompany you for your appointments. Docetaxel Injection What is this medication? DOCETAXEL (doe se TAX el) treats some types of cancer. It works by slowing down the growth of cancer cells. This medicine may be used for other purposes; ask your health care provider or pharmacist if you have questions. COMMON BRAND NAME(S): Docefrez, Taxotere What should I tell my care team before I take this  medication? They need to know if you have any of these conditions: Kidney disease Liver disease Low white blood cell levels Tingling of the fingers or toes or other nerve disorder An unusual or allergic reaction to docetaxel, polysorbate 80, other medications, foods, dyes, or preservatives Pregnant or trying  to get pregnant Breast-feeding How should I use this medication? This medication is injected into a vein. It is given by your care team in a hospital or clinic setting. Talk to your care team about the use of this medication in children. Special care may be needed. Overdosage: If you think you have taken too much of this medicine contact a poison control center or emergency room at once. NOTE: This medicine is only for you. Do not share this medicine with others. What if I miss a dose? Keep appointments for follow-up doses. It is important not to miss your dose. Call your care team if you are unable to keep an appointment. What may interact with this medication? Do not take this medication with any of the following: Live virus vaccines This medication may also interact with the following: Certain antibiotics, such as clarithromycin, telithromycin Certain antivirals for HIV or hepatitis Certain medications for fungal infections, such as itraconazole, ketoconazole, voriconazole Grapefruit juice Nefazodone Supplements, such as St. John's wort This list may not describe all possible interactions. Give your health care provider a list of all the medicines, herbs, non-prescription drugs, or dietary supplements you use. Also tell them if you smoke, drink alcohol, or use illegal drugs. Some items may interact with your medicine. What should I watch for while using this medication? This medication may make you feel generally unwell. This is not uncommon as chemotherapy can affect healthy cells as well as cancer cells. Report any side effects. Continue your course of treatment even though you feel ill unless your care team tells you to stop. You may need blood work done while you are taking this medication. This medication can cause serious side effects and infusion reactions. To reduce the risk, your care team may give you other medications to take before receiving this one. Be sure to follow the  directions from your care team. This medication may increase your risk of getting an infection. Call your care team for advice if you get a fever, chills, sore throat, or other symptoms of a cold or flu. Do not treat yourself. Try to avoid being around people who are sick. Avoid taking medications that contain aspirin, acetaminophen, ibuprofen, naproxen, or ketoprofen unless instructed by your care team. These medications may hide a fever. Be careful brushing or flossing your teeth or using a toothpick because you may get an infection or bleed more easily. If you have any dental work done, tell your dentist you are receiving this medication. Some products may contain alcohol. Ask your care team if this medication contains alcohol. Be sure to tell all care teams you are taking this medicine. Certain medications, like metronidazole and disulfiram, can cause an unpleasant reaction when taken with alcohol. The reaction includes flushing, headache, nausea, vomiting, sweating, and increased thirst. The reaction can last from 30 minutes to several hours. This medication may affect your coordination, reaction time, or judgement. Do not drive or operate machinery until you know how this medication affects you. Sit up or stand slowly to reduce the risk of dizzy or fainting spells. Drinking alcohol with this medication can increase the risk of these side effects. Talk to your care team about your risk of  cancer. You may be more at risk for certain types of cancer if you take this medication. Talk to your care team if you wish to become pregnant or think you might be pregnant. This medication can cause serious birth defects if taken during pregnancy or if you get pregnant within 2 months after stopping therapy. A negative pregnancy test is required before starting this medication. A reliable form of contraception is recommended while taking this medication and for 2 months after stopping it. Talk to your care team about  reliable forms of contraception. Do not breast-feed while taking this medication and for 1 week after stopping therapy. Use a condom during sex and for 4 months after stopping therapy. Tell your care team right away if you think your partner might be pregnant. This medication can cause serious birth defects. This medication may cause infertility. Talk to your care team if you are concerned about your fertility. What side effects may I notice from receiving this medication? Side effects that you should report to your care team as soon as possible: Allergic reactions--skin rash, itching, hives, swelling of the face, lips, tongue, or throat Change in vision such as blurry vision, seeing halos around lights, vision loss Infection--fever, chills, cough, or sore throat Infusion reactions--chest pain, shortness of breath or trouble breathing, feeling faint or lightheaded Low red blood cell level--unusual weakness or fatigue, dizziness, headache, trouble breathing Pain, tingling, or numbness in the hands or feet Painful swelling, warmth, or redness of the skin, blisters or sores at the infusion site Redness, blistering, peeling, or loosening of the skin, including inside the mouth Sudden or severe stomach pain, bloody diarrhea, fever, nausea, vomiting Swelling of the ankles, hands, or feet Tumor lysis syndrome (TLS)--nausea, vomiting, diarrhea, decrease in the amount of urine, dark urine, unusual weakness or fatigue, confusion, muscle pain or cramps, fast or irregular heartbeat, joint pain Unusual bruising or bleeding Side effects that usually do not require medical attention (report to your care team if they continue or are bothersome): Change in nail shape, thickness, or color Change in taste Hair loss Increased tears This list may not describe all possible side effects. Call your doctor for medical advice about side effects. You may report side effects to FDA at 1-800-FDA-1088. Where should I keep  my medication? This medication is given in a hospital or clinic. It will not be stored at home. NOTE: This sheet is a summary. It may not cover all possible information. If you have questions about this medicine, talk to your doctor, pharmacist, or health care provider.  2023 Elsevier/Gold Standard (2021-06-10 00:00:00) Cyclophosphamide Injection What is this medication? CYCLOPHOSPHAMIDE (sye kloe FOSS fa mide) treats some types of cancer. It works by slowing down the growth of cancer cells. This medicine may be used for other purposes; ask your health care provider or pharmacist if you have questions. COMMON BRAND NAME(S): Cyclophosphamide, Cytoxan, Neosar What should I tell my care team before I take this medication? They need to know if you have any of these conditions: Heart disease Irregular heartbeat or rhythm Infection Kidney problems Liver disease Low blood cell levels (white cells, platelets, or red blood cells) Lung disease Previous radiation Trouble passing urine An unusual or allergic reaction to cyclophosphamide, other medications, foods, dyes, or preservatives Pregnant or trying to get pregnant Breast-feeding How should I use this medication? This medication is injected into a vein. It is given by your care team in a hospital or clinic setting. Talk to your care team  about the use of this medication in children. Special care may be needed. Overdosage: If you think you have taken too much of this medicine contact a poison control center or emergency room at once. NOTE: This medicine is only for you. Do not share this medicine with others. What if I miss a dose? Keep appointments for follow-up doses. It is important not to miss your dose. Call your care team if you are unable to keep an appointment. What may interact with this medication? Amphotericin B Amiodarone Azathioprine Certain antivirals for HIV or hepatitis Certain medications for blood pressure, such as  enalapril, lisinopril, quinapril Cyclosporine Diuretics Etanercept Indomethacin Medications that relax muscles Metronidazole Natalizumab Tamoxifen Warfarin This list may not describe all possible interactions. Give your health care provider a list of all the medicines, herbs, non-prescription drugs, or dietary supplements you use. Also tell them if you smoke, drink alcohol, or use illegal drugs. Some items may interact with your medicine. What should I watch for while using this medication? This medication may make you feel generally unwell. This is not uncommon as chemotherapy can affect healthy cells as well as cancer cells. Report any side effects. Continue your course of treatment even though you feel ill unless your care team tells you to stop. You may need blood work while you are taking this medication. This medication may increase your risk of getting an infection. Call your care team for advice if you get a fever, chills, sore throat, or other symptoms of a cold or flu. Do not treat yourself. Try to avoid being around people who are sick. Avoid taking medications that contain aspirin, acetaminophen, ibuprofen, naproxen, or ketoprofen unless instructed by your care team. These medications may hide a fever. Be careful brushing or flossing your teeth or using a toothpick because you may get an infection or bleed more easily. If you have any dental work done, tell your dentist you are receiving this medication. Drink water or other fluids as directed. Urinate often, even at night. Some products may contain alcohol. Ask your care team if this medication contains alcohol. Be sure to tell all care teams you are taking this medicine. Certain medicines, like metronidazole and disulfiram, can cause an unpleasant reaction when taken with alcohol. The reaction includes flushing, headache, nausea, vomiting, sweating, and increased thirst. The reaction can last from 30 minutes to several hours. Talk to  your care team if you wish to become pregnant or think you might be pregnant. This medication can cause serious birth defects if taken during pregnancy and for 1 year after the last dose. A negative pregnancy test is required before starting this medication. A reliable form of contraception is recommended while taking this medication and for 1 year after the last dose. Talk to your care team about reliable forms of contraception. Do not father a child while taking this medication and for 4 months after the last dose. Use a condom during this time period. Do not breast-feed while taking this medication or for 1 week after the last dose. This medication may cause infertility. Talk to your care team if you are concerned about your fertility. Talk to your care team about your risk of cancer. You may be more at risk for certain types of cancer if you take this medication. What side effects may I notice from receiving this medication? Side effects that you should report to your care team as soon as possible: Allergic reactions--skin rash, itching, hives, swelling of the face, lips,  tongue, or throat Dry cough, shortness of breath or trouble breathing Heart failure--shortness of breath, swelling of the ankles, feet, or hands, sudden weight gain, unusual weakness or fatigue Heart muscle inflammation--unusual weakness or fatigue, shortness of breath, chest pain, fast or irregular heartbeat, dizziness, swelling of the ankles, feet, or hands Heart rhythm changes--fast or irregular heartbeat, dizziness, feeling faint or lightheaded, chest pain, trouble breathing Infection--fever, chills, cough, sore throat, wounds that don't heal, pain or trouble when passing urine, general feeling of discomfort or being unwell Kidney injury--decrease in the amount of urine, swelling of the ankles, hands, or feet Liver injury--right upper belly pain, loss of appetite, nausea, light-colored stool, dark yellow or brown urine,  yellowing skin or eyes, unusual weakness or fatigue Low red blood cell level--unusual weakness or fatigue, dizziness, headache, trouble breathing Low sodium level--muscle weakness, fatigue, dizziness, headache, confusion Red or dark brown urine Unusual bruising or bleeding Side effects that usually do not require medical attention (report to your care team if they continue or are bothersome): Hair loss Irregular menstrual cycles or spotting Loss of appetite Nausea Pain, redness, or swelling with sores inside the mouth or throat Vomiting This list may not describe all possible side effects. Call your doctor for medical advice about side effects. You may report side effects to FDA at 1-800-FDA-1088. Where should I keep my medication? This medication is given in a hospital or clinic. It will not be stored at home. NOTE: This sheet is a summary. It may not cover all possible information. If you have questions about this medicine, talk to your doctor, pharmacist, or health care provider.  2023 Elsevier/Gold Standard (2021-07-20 00:00:00)

## 2021-12-30 NOTE — Telephone Encounter (Signed)
This RN spoke with pt per 1st chemotherapy treatment today. She stated noted areas under her R arm and breast "that are hard " - she stated she noticed it when the " swelling went down from surgery a few days ago."  This RN assessed areas and noted a hardened "cord" in right axilla, as well as under her breast (this one was more difficult to locate due to her body position in the treatment chair) - no pain, increased warmth or tenderness when palpating areas.  This RN discussed above probable cording post surgery.  She is scheduled to see the surgeon this Monday.  Discussed benefit of seeing PT - this RN informed her since she is scheduled to see her surgeon on Monday - if evaluation shows cording - referral can be placed,  This note will be forwarded to her oncologist and surgeon for review.

## 2022-01-02 ENCOUNTER — Inpatient Hospital Stay: Payer: Medicare Other

## 2022-01-02 ENCOUNTER — Other Ambulatory Visit: Payer: Self-pay

## 2022-01-02 VITALS — BP 182/84 | HR 61 | Temp 98.0°F | Resp 18

## 2022-01-02 DIAGNOSIS — Z5111 Encounter for antineoplastic chemotherapy: Secondary | ICD-10-CM | POA: Diagnosis not present

## 2022-01-02 DIAGNOSIS — Z171 Estrogen receptor negative status [ER-]: Secondary | ICD-10-CM

## 2022-01-02 MED ORDER — PEGFILGRASTIM-CBQV 6 MG/0.6ML ~~LOC~~ SOSY
6.0000 mg | PREFILLED_SYRINGE | Freq: Once | SUBCUTANEOUS | Status: AC
  Administered 2022-01-02: 6 mg via SUBCUTANEOUS
  Filled 2022-01-02: qty 0.6

## 2022-01-02 NOTE — Patient Instructions (Signed)

## 2022-01-05 ENCOUNTER — Encounter: Payer: Self-pay | Admitting: Hematology and Oncology

## 2022-01-06 ENCOUNTER — Emergency Department (HOSPITAL_COMMUNITY): Payer: Medicare Other

## 2022-01-06 ENCOUNTER — Emergency Department (HOSPITAL_COMMUNITY)
Admission: EM | Admit: 2022-01-06 | Discharge: 2022-01-07 | Disposition: A | Discharge status: Home / Self Care | Payer: Medicare Other | Attending: Emergency Medicine | Admitting: Emergency Medicine

## 2022-01-06 ENCOUNTER — Encounter (HOSPITAL_COMMUNITY): Payer: Self-pay | Admitting: Emergency Medicine

## 2022-01-06 DIAGNOSIS — R197 Diarrhea, unspecified: Secondary | ICD-10-CM | POA: Insufficient documentation

## 2022-01-06 DIAGNOSIS — Z20822 Contact with and (suspected) exposure to covid-19: Secondary | ICD-10-CM | POA: Diagnosis not present

## 2022-01-06 DIAGNOSIS — Z85830 Personal history of malignant neoplasm of bone: Secondary | ICD-10-CM | POA: Diagnosis not present

## 2022-01-06 DIAGNOSIS — R519 Headache, unspecified: Secondary | ICD-10-CM | POA: Insufficient documentation

## 2022-01-06 DIAGNOSIS — R072 Precordial pain: Secondary | ICD-10-CM | POA: Insufficient documentation

## 2022-01-06 DIAGNOSIS — I110 Hypertensive heart disease with heart failure: Secondary | ICD-10-CM | POA: Diagnosis not present

## 2022-01-06 DIAGNOSIS — R110 Nausea: Secondary | ICD-10-CM | POA: Diagnosis not present

## 2022-01-06 DIAGNOSIS — J45909 Unspecified asthma, uncomplicated: Secondary | ICD-10-CM | POA: Diagnosis not present

## 2022-01-06 DIAGNOSIS — E119 Type 2 diabetes mellitus without complications: Secondary | ICD-10-CM | POA: Diagnosis not present

## 2022-01-06 DIAGNOSIS — M791 Myalgia, unspecified site: Secondary | ICD-10-CM | POA: Diagnosis not present

## 2022-01-06 DIAGNOSIS — I509 Heart failure, unspecified: Secondary | ICD-10-CM | POA: Diagnosis not present

## 2022-01-06 LAB — CBC
HCT: 38.6 % (ref 36.0–46.0)
Hemoglobin: 12.3 g/dL (ref 12.0–15.0)
MCH: 29 pg (ref 26.0–34.0)
MCHC: 31.9 g/dL (ref 30.0–36.0)
MCV: 91 fL (ref 80.0–100.0)
Platelets: 164 10*3/uL (ref 150–400)
RBC: 4.24 MIL/uL (ref 3.87–5.11)
RDW: 12.9 % (ref 11.5–15.5)
WBC: 5.7 10*3/uL (ref 4.0–10.5)
nRBC: 0.7 % — ABNORMAL HIGH (ref 0.0–0.2)

## 2022-01-06 LAB — COMPREHENSIVE METABOLIC PANEL
ALT: 31 U/L (ref 0–44)
AST: 17 U/L (ref 15–41)
Albumin: 3.7 g/dL (ref 3.5–5.0)
Alkaline Phosphatase: 58 U/L (ref 38–126)
Anion gap: 4 — ABNORMAL LOW (ref 5–15)
BUN: 16 mg/dL (ref 8–23)
CO2: 30 mmol/L (ref 22–32)
Calcium: 9.3 mg/dL (ref 8.9–10.3)
Chloride: 112 mmol/L — ABNORMAL HIGH (ref 98–111)
Creatinine, Ser: 1.04 mg/dL — ABNORMAL HIGH (ref 0.44–1.00)
GFR, Estimated: >60 mL/min (ref >60)
Glucose, Bld: 165 mg/dL — ABNORMAL HIGH (ref 70–99)
Potassium: 4.1 mmol/L (ref 3.5–5.1)
Sodium: 146 mmol/L — ABNORMAL HIGH (ref 135–145)
Total Bilirubin: 0.5 mg/dL (ref 0.3–1.2)
Total Protein: 6.5 g/dL (ref 6.5–8.1)

## 2022-01-06 LAB — URINALYSIS, ROUTINE W REFLEX MICROSCOPIC
Bilirubin Urine: NEGATIVE
Glucose, UA: NEGATIVE mg/dL
Hgb urine dipstick: NEGATIVE
Ketones, ur: NEGATIVE mg/dL
Leukocytes,Ua: NEGATIVE
Nitrite: NEGATIVE
Protein, ur: NEGATIVE mg/dL
Specific Gravity, Urine: 1.024 (ref 1.005–1.030)
pH: 5 (ref 5.0–8.0)

## 2022-01-06 LAB — LIPASE, BLOOD: Lipase: 51 U/L (ref 11–51)

## 2022-01-06 MED ORDER — LOPERAMIDE HCL 2 MG PO CAPS
2.0000 mg | ORAL_CAPSULE | Freq: Once | ORAL | Status: AC
  Administered 2022-01-07: 2 mg via ORAL
  Filled 2022-01-06: qty 1

## 2022-01-06 MED ORDER — SODIUM CHLORIDE 0.9 % IV BOLUS
1000.0000 mL | Freq: Once | INTRAVENOUS | Status: AC
  Administered 2022-01-07: 1000 mL via INTRAVENOUS

## 2022-01-06 MED ORDER — ONDANSETRON HCL 4 MG/2ML IJ SOLN
4.0000 mg | Freq: Once | INTRAMUSCULAR | Status: AC
  Administered 2022-01-07: 4 mg via INTRAVENOUS
  Filled 2022-01-06: qty 2

## 2022-01-06 MED ORDER — KETOROLAC TROMETHAMINE 15 MG/ML IJ SOLN
15.0000 mg | Freq: Once | INTRAMUSCULAR | Status: AC
  Administered 2022-01-07: 15 mg via INTRAVENOUS
  Filled 2022-01-06: qty 1

## 2022-01-06 NOTE — ED Provider Notes (Signed)
Emergency Department Provider Note   I have reviewed the triage vital signs and the nursing notes.   HISTORY  Chief Complaint Headache and Diarrhea   HPI [name] is a 61 y.o. female past medical history with past history of hyperlipidemia, diabetes, CHF (EF 50-55%), and breast cancer s/p surgery and first dose of chemotherapy on 9/8 presents to the emergency department with body aches, headache, diarrhea, and nausea.  She has had fatigue.  She states that she was warned that she would develop flulike symptoms after her first dose of chemotherapy and notes that the symptoms did seem to coordinate with chemo start.  She states her diarrhea seem to be improving but then returned today and seemed worse. No blood. Denies fever. She has felt some associated chest tightness which she notes "feels like indigestion" without radiation of symptoms. Denies focal abdominal pain.   Patient's HA symptoms have waxed and waned. She has diffuse HA, worse with laying flat. No sudden onset/max intensity HA.   Patient did develop a post-op seroma, per chart review, and was on Doxycycline in late August.    Past Medical History:  Diagnosis Date   Allergy    Anginal pain ([place])    a. NL cath in 2008;  b. Myoview 03/2011: dec uptake along mid anterior wall on stress imaging -> ? attenuation vs. ischemia, EF 65%;  c. 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 2008 -> coumadin x 6 mos.   Rheumatoid arthritis ([place])    Sleep apnea    on CPAP 02/2018   SOB (shortness of breath)    Thyroid disease    TIA (transient ischemic attack)    Urinary urgency    Vitamin D deficiency     Review of Systems  Constitutional: No fever/chills Cardiovascular: Positive chest pain. Respiratory: Denies shortness of breath. Gastrointestinal: Positive abdominal pain.  Positive nausea, no vomiting. Positive diarrhea.  No constipation. Genitourinary: Negative for dysuria. Musculoskeletal: Negative for back pain. Skin: Negative for rash. Neurological: Positive HA.   ____________________________________________   PHYSICAL EXAM:  VITAL SIGNS: ED Triage Vitals  Enc Vitals Group     BP 01/06/22 1948 (!) 145/84     Pulse Rate 01/06/22 1948 90     Resp 01/06/22 1948 (!) 22     Temp 01/06/22 1948 99.4 F (37.4 C)     Temp Source 01/06/22 1948 Oral     SpO2 01/06/22 1948 98 %  Constitutional: Alert and oriented. Well appearing and in no acute distress. Eyes: Conjunctivae are normal.  Head: Atraumatic. Nose: No congestion/rhinnorhea. Mouth/Throat: Mucous membranes are moist.  Neck: No stridor.   Cardiovascular: Normal rate, regular rhythm. Good peripheral circulation. Grossly normal heart sounds.   Respiratory: Normal respiratory effort.  No retractions. Lungs CTAB. Gastrointestinal: Soft and nontender. No distention.  Musculoskeletal: No lower extremity tenderness nor edema. No gross deformities of extremities. Neurologic:  Normal speech and language. No gross focal neurologic deficits are appreciated.  Skin:  Skin is warm, dry and intact. No rash noted.  ____________________________________________   LABS (all labs ordered are listed, but only abnormal results are displayed)  Labs Reviewed   COMPREHENSIVE METABOLIC PANEL - Abnormal; Notable for the following components:      Result Value   Sodium 146 (*)    Chloride 112 (*)    Glucose, Bld 165 (*)    Creatinine, Ser 1.04 (*)    Anion gap 4 (*)    All other components within normal limits  CBC - Abnormal; Notable for the following components:   nRBC 0.7 (*)    All other components within normal limits  RESP PANEL BY RT-PCR (FLU A&B, COVID) ARPGX2  CULTURE, BLOOD (ROUTINE X 2)  CULTURE, BLOOD (ROUTINE X 2)  LIPASE, BLOOD  URINALYSIS, ROUTINE W REFLEX MICROSCOPIC  TROPONIN I (HIGH SENSITIVITY)  TROPONIN I (HIGH SENSITIVITY)   ____________________________________________  EKG   EKG Interpretation  Date/Time:  Friday January 06 2022 23:39:38 EDT Ventricular Rate:  85 PR Interval:  163 QRS Duration: 119 QT Interval:  366 QTC Calculation: 436 R Axis:   67 Text Interpretation: Age not entered, assumed to be  61 years old for purpose of ECG interpretation Sinus rhythm Nonspecific intraventricular conduction delay Low voltage, precordial leads Anteroseptal infarct, old Nonspecific T abnormalities, lateral leads Similar to Aug 13th tracing Confirmed by Nanda Quinton [phone]) on 01/06/2022 11:48:19 PM        ____________________________________________  RADIOLOGY  CT Head Wo Contrast  Result Date: 01/06/2022 CLINICAL DATA:  Headaches for 2 days EXAM: CT HEAD WITHOUT CONTRAST TECHNIQUE: Contiguous axial images were obtained from the base of the skull through the vertex without intravenous contrast. RADIATION DOSE REDUCTION: This exam was performed according to the departmental dose-optimization program which includes automated exposure control, adjustment of the mA and/or kV according to patient size and/or use of iterative reconstruction technique. COMPARISON:  None Available. FINDINGS: Brain: No evidence of acute infarction, hemorrhage, hydrocephalus, extra-axial collection or mass lesion/mass effect. Vascular: No  hyperdense vessel or unexpected calcification. Skull: Normal. Negative for fracture or focal lesion. Sinuses/Orbits: No acute finding. Other: None. IMPRESSION: No acute intracranial abnormality noted. Electronically Signed   By: Inez Catalina M.D.   On: 01/06/2022 23:14   DG Chest 2 View  Result Date: 01/06/2022 CLINICAL DATA:  chest pain EXAM: CHEST - 2 VIEW COMPARISON:  Chest x-ray 12/06/2021 FINDINGS: The heart and mediastinal contours are unchanged. Aortic calcification. Surgical clips overlie the right lower lobe. No focal consolidation. No pulmonary edema. No pleural effusion. No pneumothorax. No acute osseous abnormality. IMPRESSION: 1. No active cardiopulmonary disease. 2.  Aortic Atherosclerosis (ICD10-I70.0). Electronically Signed   By: Iven Finn M.D.   On: 01/06/2022 22:06    ____________________________________________   PROCEDURES  Procedure(s) performed:   Procedures   ____________________________________________   INITIAL IMPRESSION / ASSESSMENT AND PLAN / ED COURSE  Pertinent labs & imaging results that were available during my care of the patient were  reviewed by me and considered in my medical decision making (see chart for details).   This patient is Presenting for Evaluation of HA, which does require a range of treatment options, and is a complaint that involves a high risk of morbidity and mortality.  The Differential Diagnoses includes but is not exclusive to subarachnoid hemorrhage, meningitis, encephalitis, previous head trauma, cavernous venous thrombosis, muscle tension headache, glaucoma, temporal arteritis, migraine or migraine equivalent, etc.   Critical Interventions-    Medications  sodium chloride 0.9 % bolus 1,000 mL (has no administration in time range)  ondansetron (ZOFRAN) injection 4 mg (has no administration in time range)  loperamide (IMODIUM) capsule 2 mg (has no administration in time range)  ketorolac (TORADOL) 15 MG/ML injection 15 mg  (has no administration in time range)    Reassessment after intervention:      I decided to review pertinent External Data, and in summary patient is followed by oncology here at [facility] with last visit on 8/29.   Clinical Laboratory Tests Ordered, included mild hypernatremia at 146.  No acute kidney injury.  No leukocytosis or anemia.  Lipase normal.  Radiologic Tests Ordered, included CT head and CXR. I independently interpreted the images and agree with radiology interpretation.   Cardiac Monitor Tracing which shows NSR.   Social Determinants of Health Risk patient is not an active smoker.   Consult complete with  Medical Decision Making: Summary:  Patient presents emergency department with diarrhea, body aches, headache.  CT imaging of the head with no acute findings.  Chest x-ray clear.  Initial blood work is overall reassuring but have added on troponin with some fairly atypical chest pain, C. difficile panel, blood culture, COVID swab, and UA. Will give IVF bolus and symptom mgmt meds.   Reevaluation with update and discussion with   ***Considered admission***  Disposition:   ____________________________________________  FINAL CLINICAL IMPRESSION(S) / ED DIAGNOSES  Final diagnoses:  None     NEW OUTPATIENT MEDICATIONS STARTED DURING THIS VISIT:  New Prescriptions   No medications on file    Note:  This document was prepared using Dragon voice recognition software and may include unintentional dictation errors.  Nanda Quinton, MD, [facility] Emergency Medicine

## 2022-01-06 NOTE — ED Triage Notes (Signed)
Patient reports diarrhea x2 days and body aches with headache since last night. Last chemo 9/8. Hx breast cancer.

## 2022-01-06 NOTE — ED Notes (Signed)
Pt assisted to the restroom on [facility]. Attempting to provide urine sample.

## 2022-01-06 NOTE — ED Provider Triage Note (Signed)
Emergency Medicine Provider Triage Evaluation Note  [name] , a 61 y.o. female  was evaluated in triage.  Pt complains of diarrhea ongoing for couple days.  She also complains of substernal chest pain, and headache.  She also has shortness of breath but reports that has been ongoing for a few weeks.  She does have history of CHF.  Reports compliance with her home diuretic medication as well as other medications.  Recently started chemo and had her first session last week.  States she recently had PE study done as well following her surgery.    Review of Systems  Positive: As above Negative: As above  Physical Exam  BP (!) 145/84   Pulse 90   Temp 99.4 F (37.4 C) (Oral)   Resp (!) 22   SpO2 98%  Gen:   Awake, no distress   Resp:  Normal effort  MSK:   Moves extremities without difficulty  Other:    Medical Decision Making  Medically screening exam initiated at 9:47 PM.  Appropriate orders placed.  [name] was informed that the remainder of the evaluation will be completed by another provider, this initial triage assessment does not replace that evaluation, and the importance of remaining in the ED until their evaluation is complete.  Without tachycardia or hypoxia.  We will add on ACS work-up, and CT head.  Will defer CT angio until she gets a room and is reevaluated.   Tara Courier, PA-C 01/06/22 2149

## 2022-01-07 DIAGNOSIS — R197 Diarrhea, unspecified: Secondary | ICD-10-CM | POA: Diagnosis not present

## 2022-01-07 LAB — RESP PANEL BY RT-PCR (FLU A&B, COVID) ARPGX2
Influenza A by PCR: NEGATIVE
Influenza B by PCR: NEGATIVE
SARS Coronavirus 2 by RT PCR: NEGATIVE

## 2022-01-07 LAB — TROPONIN I (HIGH SENSITIVITY)
Troponin I (High Sensitivity): 8 ng/L (ref <18)
Troponin I (High Sensitivity): 9 ng/L (ref <18)

## 2022-01-07 MED ORDER — ONDANSETRON 4 MG PO TBDP: 4.0000 mg | ORAL_TABLET | Freq: Three times a day (TID) | ORAL | 0 refills | Status: DC | PRN

## 2022-01-07 MED ORDER — LOPERAMIDE HCL 2 MG PO CAPS: 2.0000 mg | ORAL_CAPSULE | Freq: Four times a day (QID) | ORAL | 0 refills | Status: AC | PRN

## 2022-01-07 NOTE — Discharge Instructions (Signed)
You are seen in the emergency room today with diarrhea and chest discomfort.  Your lab work here is reassuring.  I am sending medications to your pharmacy to help with symptoms.  Please follow closely with your oncology team.  Return with any new or suddenly worsening symptoms.

## 2022-01-09 ENCOUNTER — Telehealth: Payer: Self-pay | Admitting: *Deleted

## 2022-01-09 MED ORDER — VALACYCLOVIR HCL 1 G PO TABS: 1000.0000 mg | ORAL_TABLET | Freq: Two times a day (BID) | ORAL | 0 refills | Status: DC

## 2022-01-09 NOTE — Telephone Encounter (Signed)
This RN was able to speak with the patient per her return call to this RN.   [name] states diarrhea has resolved at this time.  She does state " kind of a grippy stomach - not really nauseated but feels kind of swimmy like "  She states she is having some cramping in her toes.  She notices skin changes on her left arm which is her surgical side " but I haven't let anyone do anything to that side "  She states " right at the crease of my arm just small scabs but they are improving "  She states she has " bumps " in her genital area " maybe because my urine is irritating it ?"  Per inquiry she does state she has remote history of genital herpes.  She states her mouth is dry. She is using the biotin mouth wash.  This RN reviewed above with the patient including possible benefit with cramping by drinking some gatorade and may also try tonic water.  This RN suggested for her dry mouth to rinse and gargle with coconut oil.  This RN informed her of need for prescription for the genital " bumps " and that she may need to stay on something for this while she is under chemo therapy.  Per phone discussion pt stated [facility] and is being prescribed metformin, and Ozempic - and she is wondering if this is causing more stomach irritation.  " It seems like the Ozempic has always made my stomach feel not good and it seems to made my stomach look bigger"  Of note pt has appointment this Wednesday at [facility] and per this discussion with review with them her current chemotherapy regimen for possible hold on weight loss medications during this time.  Plan per above- pt will call this RN on Wednesday with update. This RN reviewed concern for onset of genital herpes outbreak and obtained an order for Valtrex for suppression with recommendation for Dr Chryl Heck to review for ongoing dosing while pt is under chemotherapy.  Prescription of Valtrex  1 gm #14 sig of 1 po bid sent to pt's pharmacy.

## 2022-01-09 NOTE — Telephone Encounter (Signed)
This RN called pt post receiving information that pt went to the ER over the weekend due to increased diarrhea and weakness.  Pt received IVF's and support medication including lomotil and other support meds and was discharged home.   Note prescription for 12 lomotils given per prescription by the ER.   This RN obtained identified VM- message left requesting to return call for update on her current status.

## 2022-01-11 ENCOUNTER — Ambulatory Visit: Payer: Medicare Other | Attending: Surgery | Admitting: Rehabilitation

## 2022-01-11 ENCOUNTER — Encounter (INDEPENDENT_AMBULATORY_CARE_PROVIDER_SITE_OTHER): Payer: Self-pay | Admitting: Family Medicine

## 2022-01-11 ENCOUNTER — Ambulatory Visit (INDEPENDENT_AMBULATORY_CARE_PROVIDER_SITE_OTHER): Payer: Medicare Other | Admitting: Family Medicine

## 2022-01-11 ENCOUNTER — Encounter: Payer: Self-pay | Admitting: Rehabilitation

## 2022-01-11 VITALS — BP 109/70 | HR 79 | Temp 98.4°F | Ht 59.0 in | Wt 199.0 lb

## 2022-01-11 DIAGNOSIS — Z7985 Long-term (current) use of injectable non-insulin antidiabetic drugs: Secondary | ICD-10-CM

## 2022-01-11 DIAGNOSIS — Z6841 Body Mass Index (BMI) 40.0 and over, adult: Secondary | ICD-10-CM

## 2022-01-11 DIAGNOSIS — E1165 Type 2 diabetes mellitus with hyperglycemia: Secondary | ICD-10-CM

## 2022-01-11 DIAGNOSIS — E559 Vitamin D deficiency, unspecified: Secondary | ICD-10-CM | POA: Diagnosis not present

## 2022-01-11 DIAGNOSIS — Z483 Aftercare following surgery for neoplasm: Secondary | ICD-10-CM | POA: Insufficient documentation

## 2022-01-11 DIAGNOSIS — F32A Depression, unspecified: Secondary | ICD-10-CM | POA: Diagnosis not present

## 2022-01-11 DIAGNOSIS — Z7984 Long term (current) use of oral hypoglycemic drugs: Secondary | ICD-10-CM

## 2022-01-11 DIAGNOSIS — C50411 Malignant neoplasm of upper-outer quadrant of right female breast: Secondary | ICD-10-CM | POA: Insufficient documentation

## 2022-01-11 DIAGNOSIS — Z171 Estrogen receptor negative status [ER-]: Secondary | ICD-10-CM | POA: Diagnosis present

## 2022-01-11 DIAGNOSIS — R293 Abnormal posture: Secondary | ICD-10-CM | POA: Insufficient documentation

## 2022-01-11 DIAGNOSIS — E669 Obesity, unspecified: Secondary | ICD-10-CM

## 2022-01-11 DIAGNOSIS — F419 Anxiety disorder, unspecified: Secondary | ICD-10-CM | POA: Diagnosis not present

## 2022-01-11 MED ORDER — VITAMIN D (ERGOCALCIFEROL) 1.25 MG (50000 UNIT) PO CAPS: 50000.0000 [IU] | ORAL_CAPSULE | ORAL | 0 refills | Status: DC

## 2022-01-11 MED ORDER — SERTRALINE HCL 25 MG PO TABS: 25.0000 mg | ORAL_TABLET | Freq: Every day | ORAL | 1 refills | Status: DC

## 2022-01-11 MED ORDER — OZEMPIC (0.25 OR 0.5 MG/DOSE) 2 MG/1.5ML ~~LOC~~ SOPN: 0.5000 mg | PEN_INJECTOR | SUBCUTANEOUS | 0 refills | Status: DC

## 2022-01-11 NOTE — Patient Instructions (Signed)
     [facility]  [address], [address]  [place] [address]  [phone]  After Breast Cancer Class It is recommended you attend the ABC class to be educated on lymphedema risk reduction. This class is free of charge and lasts for 1 hour. It is a 1-time class. You will need to download the Webex app either on your phone or computer. We will send you a link the night before or the morning of the class. You should be able to click on that link to join the class. This is not a confidential class. You don't have to turn your camera on, but other participants may be able to see your email address.  Scar massage You can begin gentle scar massage to you incision sites. Gently place one hand on the incision and move the skin (without sliding on the skin) in various directions. Do this for a few minutes and then you can gently massage either coconut oil or vitamin E cream into the scars.  Compression garment You should continue wearing your compression bra until you feel like you no longer have swelling.  Home exercise Program Continue doing the exercises you were given until you feel like you can do them without feeling any tightness at the end.   Walking Program Studies show that 30 minutes of walking per day (fast enough to elevate your heart rate) can significantly reduce the risk of a cancer recurrence. If you can't walk due to other medical reasons, we encourage you to find another activity you could do (like a stationary bike or water exercise).  Posture After breast cancer surgery, people frequently sit with rounded shoulders posture because it puts their incisions on slack and feels better. If you sit like this and scar tissue forms in that position, you can become very tight and have pain sitting or standing with good posture. Try to be aware of your posture and sit and stand up tall to heal properly.  Follow up PT: It is recommended you return every 3 months for  the first 2 years following surgery to be assessed on the SOZO machine for an L-Dex score. This helps prevent clinically significant lymphedema in 95% of patients. These follow up screens are 10 minute appointments that you are not billed for.  

## 2022-01-11 NOTE — Therapy (Signed)
OUTPATIENT PHYSICAL THERAPY BREAST CANCER POST OP FOLLOW UP   Patient Name: [name] MRN: [id number] DOB:July 01, 1960, 61 y.o., female Today's Date: 01/11/2022   PT End of Session - 01/11/22 1359     Visit Number 2    Number of Visits 2    PT Start Time 1300    PT Stop Time 1340    PT Time Calculation (min) 40 min    Activity Tolerance Patient tolerated treatment well    Behavior During Therapy [facility] for tasks assessed/performed             Past Medical History:  Diagnosis Date   Allergy    Anginal pain ([place])    a. NL cath in 2008;  b. Myoview 03/2011: dec uptake along mid anterior wall on stress imaging -> ? attenuation vs. ischemia, EF 65%;  c. Echo 04/2011: EF 55-60%, no RWMA, Gr 2 dd   Anxiety    Arthritis    Asthma    Back pain    Bone cancer ([place])    Cancer ([place])    Chest pain    CHF (congestive heart failure) ([place])    Clotting disorder (HCC)    Constipation    Depression    Diabetes mellitus    Drug use    Dyspnea    Frequent urination    GERD (gastroesophageal reflux disease)    Glaucoma    History of stomach ulcers    HLD (hyperlipidemia)    Hypertension    IBS (irritable bowel syndrome)    Joint pain    Joint pain    Lactose intolerance    Leg edema    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 benign thymoma   Neuromuscular disorder ([place])    Obesity    Palpitations    Pneumonia 05/2016   double   Pulmonary edema    Pulmonary embolism ([place])    a. 2008 -> coumadin x 6 mos.   Rheumatoid arthritis ([place])    Sleep apnea    on CPAP 02/2018   SOB (shortness of breath)    Thyroid disease    TIA (transient ischemic attack)    Urinary urgency    Vitamin D deficiency    Past Surgical History:  Procedure Laterality Date   ABDOMINAL HYSTERECTOMY  2005   APPENDECTOMY     BREAST LUMPECTOMY WITH RADIOACTIVE SEED AND SENTINEL LYMPH NODE BIOPSY Right 11/16/2021   Procedure: RIGHT BREAST RADIOACTIVE SEED LOCALIZED LUMPECTOMY WITH RIGHT AXILLARY  SENTINEL LYMPH NODE BIOPSY;  Surgeon: Donnie Mesa, MD;  Location: [place];  Service: General;  Laterality: Right;  LMA & PEC BLOCK   CARDIAC CATHETERIZATION     Normal   CARDIAC CATHETERIZATION N/A 09/30/2014   Procedure: Left Heart Cath and Coronary Angiography;  Surgeon: Sherren Mocha, MD;  Location: [place] [facility];  Service: Cardiovascular;  Laterality: N/A;   LAPAROSCOPIC APPENDECTOMY N/A 06/03/2012   Procedure: APPENDECTOMY LAPAROSCOPIC;  Surgeon: Stark Klein, MD;  Location: [place];  Service: General;  Laterality: N/A;   Left knee surgery  2008   LEG SURGERY     TONSILLECTOMY     TOTAL HIP ARTHROPLASTY Left 06/11/2018   Procedure: LEFT TOTAL HIP ARTHROPLASTY ANTERIOR APPROACH;  Surgeon: Meredith Pel, MD;  Location: [place];  Service: Orthopedics;  Laterality: Left;   TOTAL KNEE ARTHROPLASTY Left 08/22/2016   Procedure: TOTAL KNEE ARTHROPLASTY;  Surgeon: Meredith Pel, MD;  Location: [place];  Service:  Orthopedics;  Laterality: Left;   TUBAL LIGATION  1989   Patient Active Problem List   Diagnosis Date Noted   Multiple thyroid nodules 11/28/2021   Genetic testing 11/22/2021   Family history of breast cancer 11/03/2021   Family history of colon cancer 11/03/2021   Malignant neoplasm of upper-outer quadrant of female breast ([place]) 11/02/2021   Back pain 06/01/2021   Right thyroid nodule 05/31/2021   At risk for impaired metabolic function 23/04/7207   Unilateral primary osteoarthritis, left hip    Hip arthritis 06/11/2018   Class 3 severe obesity with serious comorbidity and body mass index (BMI) of 40.0 to 44.9 in adult [facility]) 05/29/2018   Other fatigue 11/20/2017   Shortness of breath on exertion 11/20/2017   Diabetes mellitus ([place]) 11/20/2017   Vitamin D deficiency 11/20/2017   Depression 11/20/2017   Other hyperlipidemia 11/20/2017   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/04/2016   Presence of left artificial knee joint 09/20/2016   Arthritis of knee  08/22/2016   Cervical radiculopathy 07/26/2016   Cervical disc disorder with radiculopathy 07/26/2016   Right arm pain 06/29/2016   Cervicalgia 06/29/2016   Primary osteoarthritis of left knee 06/29/2016   Hypersomnia with sleep apnea 11/18/2015   Lethargy 11/18/2015   Exertional chest pain 09/30/2014   Abnormal cardiac function test 09/29/2014   Chest pain with moderate risk for cardiac etiology 09/28/2014   HTN (hypertension) 09/28/2014   Hypokalemia    Paresthesia 06/18/2014   Numbness and tingling of left arm and leg    Unstable angina ([place]) 05/24/2014   OSA on CPAP 05/24/2014   CAD (coronary artery disease) 11/25/2012   S/P laparoscopic appendectomy 10/68/1661   Diastolic CHF ([place]) 96/94/0982   Mediastinal mass 01/09/2012   SOB (shortness of breath) 06/20/2011   Mediastinal abnormality 06/20/2011   Dyslipidemia 12/23/2009   GLAUCOMA 12/23/2009   ARTHRITIS 12/23/2009   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 FNP   REFERRING PROVIDER: Dr. Georgette Dover   REFERRING DIAG: Rt breast cancer  THERAPY DIAG:  Malignant neoplasm of upper-outer quadrant of right breast in female, estrogen receptor negative ([place])  Abnormal posture  Aftercare following surgery for neoplasm  Rationale for Evaluation and Treatment Rehabilitation  ONSET DATE: 10/28/21  SUBJECTIVE:  SUBJECTIVE STATEMENT: Surgery went well.  I developed cording.  It is still there.    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 Other hx includes DM, CHF, hx of stroke, anxiety.  I am doing chemotherapy right now and have 3 left. Will be having radiation next.  Had an axillary seroma drained x 4.    PATIENT GOALS:  Reassess how my recovery is going related to arm function, pain, and swelling.  PAIN:  Are you having pain? No   PRECAUTIONS: Recent Surgery, right UE Lymphedema risk  ACTIVITY LEVEL / LEISURE: It is just the chemo part that is hard.     OBJECTIVE:   PATIENT SURVEYS:  QUICK DASH: Not completed  OBSERVATIONS:  Well healed axillary and breast incisions   POSTURE:  Forward head and rounded shoulders posture   UPPER EXTREMITY AROM/PROM:   A/PROM RIGHT   eval   01/11/22  Shoulder extension 48 60  Shoulder flexion 132pn 135pn  Shoulder abduction 135pn 135  Shoulder internal rotation     Shoulder external rotation 78pn in elbow  80                          (Blank rows = not tested)   A/PROM LEFT   eval  Shoulder extension 55  Shoulder flexion 153  Shoulder abduction 145  Shoulder internal rotation    Shoulder external rotation 85                          (Blank rows = not tested)     CERVICAL AROM: All within normal limits:    LYMPHEDEMA ASSESSMENTS:    LANDMARK RIGHT   eval 01/11/22  10 cm proximal to olecranon process 36 37  Olecranon process 29 29.5  10 cm proximal to ulnar styloid process 23.5 23.9  Just proximal to ulnar styloid process 17.5 17.4  Across hand at thumb web space 20.0 19.4  At base of 2nd digit 7.3 7.0  (Blank rows = not tested)   LANDMARK LEFT   eval 01/11/22  10 cm proximal to olecranon process 35.2 36.5  Olecranon process 30.5 30.5  10 cm proximal to ulnar styloid process 23.5 23.5  Just proximal to ulnar styloid process 17 17   Across hand at thumb web space 20.3 20.5  At base of 2nd digit 6.9 6.9  (Blank rows = not tested)   PATIENT EDUCATION:  Education details: updated HEP Person educated: Patient Education method: Consulting civil engineer, Media planner, Verbal cues, and Handouts Education comprehension: verbalized understanding and returned demonstration  HOME EXERCISE PROGRAM:  Reviewed previously given post op HEP. Access Code: [email or web address] URL: [email or web address] Date: 01/11/2022 Prepared by: Shan Levans  Exercises - Supine Shoulder Flexion with Dowel  - 1 x daily - 7 x weekly - 1-3 sets - 10 reps - 5 seconds hold - Supine Chest Stretch with Elbows Bent  - 1 x daily - 7 x weekly - 1 sets - 3 reps - 30-60seconds hold - Standing Shoulder Abduction Slides at Wall  - 1 x daily - 7 x weekly - 1-3 sets - 5 reps - 5 seconds hold - Seated Scapular Retraction  - 1 x daily - 7 x weekly - 1-3 sets - 10 reps - 2-3 seconds hold  ASSESSMENT:  CLINICAL IMPRESSION: Pt is doing well post operatively.  Developed a seroma  which she had aspirated 4 times but seems to be okay now.  Has some cording in the axilla but has returned to full ROM so pt will attempt self stretches and let us know if she needs any help with this.    Pt will benefit from skilled therapeutic intervention to improve on the following deficits: Decreased knowledge of precautions, impaired UE functional use, pain, decreased ROM, postural dysfunction.   PT treatment/interventions: ADL/Self care home management, Patient/Family education     GOALS: Goals reviewed with patient? Yes  LONG TERM GOALS:  (STG=LTG)  GOALS Name Target Date  Goal status  1 Pt will demonstrate she has regained full shoulder ROM and function post operatively compared to baselines.  Baseline: 01/11/22 MET                    PLAN: PT FREQUENCY/DURATION: follow up as needed  PLAN FOR NEXT SESSION: SOZO only    [facility]  [address],  [address]  [place] [address]  [phone]  After Breast Cancer Class It is recommended you attend the ABC class to be educated on lymphedema risk reduction. This class is free of charge and lasts for 1 hour. It is a 1-time class. You will need to download the Webex app either on your phone or computer. We will send you a link the night before or the morning of the class. You should be able to click on that link to join the class. This is not a confidential class. You don't have to turn your camera on, but other participants may be able to see your email address.  Scar massage You can begin gentle scar massage to you incision sites. Gently place one hand on the incision and move the skin (without sliding on the skin) in various directions. Do this for a few minutes and then you can gently massage either coconut oil or vitamin E cream into the scars.  Compression garment You should continue wearing your compression bra until you feel like you no longer have swelling.  Home exercise Program Continue doing the exercises you were given until you feel like you can do them without feeling any tightness at the end.   Walking Program Studies show that 30 minutes of walking per day (fast enough to elevate your heart rate) can significantly reduce the risk of a cancer recurrence. If you can't walk due to other medical reasons, we encourage you to find another activity you could do (like a stationary bike or water exercise).  Posture After breast cancer surgery, people frequently sit with rounded shoulders posture because it puts their incisions on slack and feels better. If you sit like this and scar tissue forms in that position, you can become very tight and have pain sitting or standing with good posture. Try to be aware of your posture and sit and stand up tall to heal properly.  Follow up PT: It is recommended you return every 3 months for the first 3 years following surgery to be assessed on  the SOZO machine for an L-Dex score. This helps prevent clinically significant lymphedema in 95% of patients. These follow up screens are 10 minute appointments that you are not billed for.  Stark Bray, PT 01/11/2022, 2:43 PM

## 2022-01-12 LAB — CULTURE, BLOOD (ROUTINE X 2): Culture: NO GROWTH

## 2022-01-12 NOTE — Progress Notes (Signed)
Chief Complaint:   OBESITY [name] is here to discuss her progress with her obesity treatment plan along with follow-up of her obesity related diagnoses. Piera is on the Category 2 Plan and states she is following her eating plan approximately 65% of the time. Oprah states she is exercising 0 minutes 0 times per week.  Today's visit was #: 10 Starting weight: 209 lbs Starting date: 06/01/2021 Today's weight: 199 lbs Today's date: 01/11/2022 Total lbs lost to date: 10 lbs Total lbs lost since last in-office visit: 0  Interim History: Alijah feels concerned and overwhelmed. Undergoing chemo now. 1st chemo a week ago and next one next week. Since last appointment she had ED appointment for diarrhea and weakness. Thinks she needs a break from program for a bit.  Subjective:   1. Vitamin D deficiency Avagail's last Vit D level of 39.8. + IV ( undergoing chemo).  2. Type 2 diabetes mellitus with hyperglycemia, without long-term current use of insulin (HCC) Kerria's A1c 6.4. She is on Ozempic and Metformin. She is having significant GI issues with 1st chemotherapy.  3. Anxiety and depression Delano is on Zoloft 25 mg. She is trying to stay positive but is feeling overwhelmed.  Assessment/Plan:   1. Vitamin D deficiency We will refill Vit D 50k IU once a week for 1 month with 0 refills.  -Refill Vitamin D, Ergocalciferol, (DRISDOL) 1.25 MG (50000 UNIT) CAPS capsule; Take 1 capsule (50,000 Units total) by mouth every 7 (seven) days.  Dispense: 4 capsule; Refill: 0  2. Type 2 diabetes mellitus with hyperglycemia, without long-term current use of insulin (HCC) We will refill Ozempic 0.5 mg SubQ once weekly for 1 month with 0 refills.  -Refill Semaglutide,0.25 or 0.'5MG'$ /DOS, (OZEMPIC, 0.25 OR 0.5 MG/DOSE,) 2 MG/1.5ML SOPN; Inject 0.5 mg into the skin once a week.  Dispense: 3 mL; Refill: 0  3. Anxiety and depression We will refill Zoloft 25 mg by mouth daily for 3 months with 0  refills. Patient encouraged to increase Zoloft if necessary.   -Refill sertraline (ZOLOFT) 25 MG tablet; Take 1 tablet (25 mg total) by mouth daily.  Dispense: 90 tablet; Refill: 1  4. Obesity, current BMI of 40.3 Shameria is currently in the action stage of change. As such, her goal is to continue with weight loss efforts. She has agreed to the Category 2 Plan.   Exercise goals: No exercise has been prescribed at this time.  Behavioral modification strategies: increasing lean protein intake, meal planning and cooking strategies, keeping healthy foods in the home, and planning for success.  Jamileth has agreed to follow-up with our clinic in 12 weeks. She was informed of the importance of frequent follow-up visits to maximize her success with intensive lifestyle modifications for her multiple health conditions.   Objective:   Blood pressure 109/70, pulse 79, temperature 98.4 F (36.9 C), height '4\' 11"'$  (1.499 m), weight 199 lb (90.3 kg), SpO2 99 %. Body mass index is 40.19 kg/m.  General: Cooperative, alert, well developed, in no acute distress. HEENT: Conjunctivae and lids unremarkable. Cardiovascular: Regular rhythm.  Lungs: Normal work of breathing. Neurologic: No focal deficits.   Lab Results  Component Value Date   CREATININE 1.04 (H) 01/06/2022   BUN 16 01/06/2022   NA 146 (H) 01/06/2022   K 4.1 01/06/2022   CL 112 (H) 01/06/2022   CO2 30 01/06/2022   Lab Results  Component Value Date   ALT 31 01/06/2022   AST 17 01/06/2022  ALKPHOS 58 01/06/2022   BILITOT 0.5 01/06/2022   Lab Results  Component Value Date   HGBA1C 6.4 (H) 09/28/2021   HGBA1C 8.4 (H) 03/08/2021   HGBA1C 7.6 (H) 02/03/2020   HGBA1C 7.5 (H) 10/07/2019   HGBA1C 6.3 (H) 12/26/2018   Lab Results  Component Value Date   INSULIN 22.1 09/28/2021   INSULIN 26.8 (H) 06/01/2021   INSULIN 44.4 (H) 02/03/2020   INSULIN 28.1 (H) 12/26/2018   INSULIN 20.2 03/04/2018   Lab Results  Component Value Date    TSH 0.506 04/23/2021   Lab Results  Component Value Date   CHOL 127 09/28/2021   HDL 48 09/28/2021   LDLCALC 60 09/28/2021   TRIG 99 09/28/2021   CHOLHDL 4.2 04/23/2011   CHOLHDL 4.3 04/23/2011   Lab Results  Component Value Date   VD25OH 39.8 09/28/2021   VD25OH 30.0 06/01/2021   VD25OH 44.6 02/03/2020   Lab Results  Component Value Date   WBC 5.7 01/06/2022   HGB 12.3 01/06/2022   HCT 38.6 01/06/2022   MCV 91.0 01/06/2022   PLT 164 01/06/2022   No results found for: "IRON", "TIBC", "FERRITIN"  Attestation Statements:   Reviewed by clinician on day of visit: allergies, medications, problem list, medical history, surgical history, family history, social history, and previous encounter notes.  Time spent on visit including pre-visit chart review and post-visit care and charting was 42 minutes.   I, Elnora Morrison, RMA am acting as transcriptionist for Coralie Common, MD.  I have reviewed the above documentation for accuracy and completeness, and I agree with the above. - Coralie Common, MD

## 2022-01-19 ENCOUNTER — Telehealth: Payer: Self-pay | Admitting: *Deleted

## 2022-01-19 MED FILL — Dexamethasone Sodium Phosphate Inj 100 MG/10ML: INTRAMUSCULAR | Qty: 1 | Status: AC

## 2022-01-19 NOTE — Telephone Encounter (Signed)
Patient called to verify she was taking dexamethasone correctly in days before and after chemotherapy Reviewed directions with patient. She verbalized understanding. Patient also stated she is experiencing greater hair loss than in previous weeks. She said she's already cut it short. She also said her scalp is itchy at times. Recommended gentle washing, pat dry and mild hypoallergenic lotion, used sparingly to moisturize scalp. She verbalized understanding.

## 2022-01-20 ENCOUNTER — Encounter: Payer: Self-pay | Admitting: Hematology and Oncology

## 2022-01-20 ENCOUNTER — Inpatient Hospital Stay: Payer: Medicare Other

## 2022-01-20 ENCOUNTER — Other Ambulatory Visit: Payer: Self-pay

## 2022-01-20 ENCOUNTER — Inpatient Hospital Stay (HOSPITAL_BASED_OUTPATIENT_CLINIC_OR_DEPARTMENT_OTHER): Payer: Medicare Other | Admitting: Hematology and Oncology

## 2022-01-20 DIAGNOSIS — C50411 Malignant neoplasm of upper-outer quadrant of right female breast: Secondary | ICD-10-CM

## 2022-01-20 DIAGNOSIS — Z5111 Encounter for antineoplastic chemotherapy: Secondary | ICD-10-CM | POA: Diagnosis not present

## 2022-01-20 DIAGNOSIS — Z171 Estrogen receptor negative status [ER-]: Secondary | ICD-10-CM

## 2022-01-20 LAB — CBC WITH DIFFERENTIAL (CANCER CENTER ONLY)
Abs Immature Granulocytes: 0.13 10*3/uL — ABNORMAL HIGH (ref 0.00–0.07)
Basophils Absolute: 0 10*3/uL (ref 0.0–0.1)
Basophils Relative: 0 %
Eosinophils Absolute: 0 10*3/uL (ref 0.0–0.5)
Eosinophils Relative: 0 %
HCT: 36.3 % (ref 36.0–46.0)
Hemoglobin: 12.1 g/dL (ref 12.0–15.0)
Immature Granulocytes: 1 %
Lymphocytes Relative: 9 %
Lymphs Abs: 1.1 10*3/uL (ref 0.7–4.0)
MCH: 29.2 pg (ref 26.0–34.0)
MCHC: 33.3 g/dL (ref 30.0–36.0)
MCV: 87.7 fL (ref 80.0–100.0)
Monocytes Absolute: 0.3 10*3/uL (ref 0.1–1.0)
Monocytes Relative: 3 %
Neutro Abs: 10.4 10*3/uL — ABNORMAL HIGH (ref 1.7–7.7)
Neutrophils Relative %: 87 %
Platelet Count: 244 10*3/uL (ref 150–400)
RBC: 4.14 MIL/uL (ref 3.87–5.11)
RDW: 14.3 % (ref 11.5–15.5)
WBC Count: 12 10*3/uL — ABNORMAL HIGH (ref 4.0–10.5)
nRBC: 0 % (ref 0.0–0.2)

## 2022-01-20 LAB — CMP (CANCER CENTER ONLY)
ALT: 24 U/L (ref 0–44)
AST: 14 U/L — ABNORMAL LOW (ref 15–41)
Albumin: 4.2 g/dL (ref 3.5–5.0)
Alkaline Phosphatase: 66 U/L (ref 38–126)
Anion gap: 6 (ref 5–15)
BUN: 18 mg/dL (ref 8–23)
CO2: 27 mmol/L (ref 22–32)
Calcium: 9.7 mg/dL (ref 8.9–10.3)
Chloride: 106 mmol/L (ref 98–111)
Creatinine: 0.84 mg/dL (ref 0.44–1.00)
GFR, Estimated: >60 mL/min (ref >60)
Glucose, Bld: 296 mg/dL — ABNORMAL HIGH (ref 70–99)
Potassium: 3.9 mmol/L (ref 3.5–5.1)
Sodium: 139 mmol/L (ref 135–145)
Total Bilirubin: 0.4 mg/dL (ref 0.3–1.2)
Total Protein: 7 g/dL (ref 6.5–8.1)

## 2022-01-20 MED ORDER — SODIUM CHLORIDE 0.9 % IV SOLN
60.0000 mg/m2 | Freq: Once | INTRAVENOUS | Status: AC
  Administered 2022-01-20: 120 mg via INTRAVENOUS
  Filled 2022-01-20: qty 12

## 2022-01-20 MED ORDER — SODIUM CHLORIDE 0.9 % IV SOLN
10.0000 mg | Freq: Once | INTRAVENOUS | Status: AC
  Administered 2022-01-20: 10 mg via INTRAVENOUS
  Filled 2022-01-20: qty 10

## 2022-01-20 MED ORDER — SODIUM CHLORIDE 0.9 % IV SOLN: Freq: Once | INTRAVENOUS | Status: AC

## 2022-01-20 MED ORDER — SODIUM CHLORIDE 0.9 % IV SOLN
1000.0000 mg | Freq: Once | INTRAVENOUS | Status: AC
  Administered 2022-01-20: 1000 mg via INTRAVENOUS
  Filled 2022-01-20: qty 50

## 2022-01-20 MED ORDER — PALONOSETRON HCL INJECTION 0.25 MG/5ML
0.2500 mg | Freq: Once | INTRAVENOUS | Status: AC
  Administered 2022-01-20: 0.25 mg via INTRAVENOUS
  Filled 2022-01-20: qty 5

## 2022-01-20 NOTE — Progress Notes (Signed)
[place] NOTE  Patient Care Team: Sonia Side., FNP as PCP - General (Family Medicine) Josue Hector, MD as PCP - Cardiology (Cardiology) Mauro Kaufmann, RN as Oncology Nurse Navigator Rockwell Germany, RN as Oncology Nurse Navigator Donnie Mesa, MD as Consulting Physician (General Surgery) [name] Pike, MD as Consulting Physician (Hematology and Oncology) Kyung Rudd, MD as Consulting Physician (Radiation Oncology)  CHIEF COMPLAINTS/PURPOSE OF CONSULTATION:  Newly diagnosed breast cancer  HISTORY OF PRESENTING ILLNESS:   KALIANNE FETTING 61 y.o. female is here because of recent diagnosis of right breast IDC  I reviewed her records extensively and collaborated the history with the patient.  SUMMARY OF ONCOLOGIC HISTORY: Oncology History  Malignant neoplasm of upper-outer quadrant of female breast ([place])  10/07/2021 Mammogram   Right breast, possible mass warrants further evaluation, no findings suspicious for malignancy in the left breast.Targeted ultrasound is performed, showing a 8 x 6 x 7 mm irregular mass at the right breast 1 o'clock 13 cm from nipple correlating to the mammographic mass. Ultrasound of the right axilla is negative.   10/27/2021 Pathology Results   Per verbal report at [facility] today, grade 3 IDC, triple negative, Ki 67 25%   11/02/2021 Initial Diagnosis   Malignant neoplasm of upper-outer quadrant of female breast ([place])   11/21/2021 Genetic Testing   No pathogenic variants detected in [facility] CustomNext-Cancer +RNAinsight Panel.  Report date is November 21, 2021.   The CustomNext-Cancer+RNAinsight panel offered by [facility] includes sequencing and rearrangement analysis for the following 47 genes:  APC, ATM, AXIN2, BARD1, BMPR1A, BRCA1, BRCA2, BRIP1, CDH1, CDK4, CDKN2A, CHEK2, DICER1, EPCAM, GREM1, HOXB13, MEN1, MLH1, MSH2, MSH3, MSH6, MUTYH, NBN, NF1, NF2, NTHL1, PALB2, PMS2, POLD1, POLE, PTEN, RAD51C, RAD51D, RECQL,  RET, SDHA, SDHAF2, SDHB, SDHC, SDHD, SMAD4, SMARCA4, STK11, TP53, TSC1, TSC2, and VHL.  RNA data is routinely analyzed for use in variant interpretation for all genes.   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  Prior prognostic showed ER 0% negative PR 0% negative, HER2 low with IHC of 1+ and Ki-67 of 25%.   12/30/2021 -  Chemotherapy   Patient is on Treatment Plan : BREAST TC q21d      Interval History  She is here for a follow up after first cycle of chemotherapy. After first cycle, she had a very bad episode of headache, went to the ED, had a CT which was unremarkable. She has also noted some mild tingling and numbness of her tips of her hands which is very intermittent.  She denies any other complaints.  No nausea, vomiting, diarrhea.  She is here today with her daughter.  She believes that the dose could be reduced for better tolerance.  DM is well controlled, Hb A1c is 6.4%. ECHO 50-55%. Nov 2022. History of PE in 2008, took anticoagulation.  MEDICAL HISTORY:  Past Medical History:  Diagnosis Date   Allergy    Anginal pain ([place])    a. NL cath in 2008;  b. Myoview 03/2011: dec uptake along mid anterior wall on stress imaging -> ? attenuation vs. ischemia, EF 65%;  c. 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 2008 -> coumadin x 6 mos.   Rheumatoid arthritis ([place])    Sleep apnea    on CPAP 02/2018   SOB (shortness of breath)    Thyroid disease    TIA (transient ischemic attack)    Urinary urgency    Vitamin D deficiency     SURGICAL HISTORY: Past Surgical History:  Procedure Laterality Date   ABDOMINAL HYSTERECTOMY  2005   APPENDECTOMY     BREAST LUMPECTOMY WITH RADIOACTIVE SEED AND SENTINEL LYMPH NODE BIOPSY Right 11/16/2021   Procedure: RIGHT BREAST RADIOACTIVE SEED LOCALIZED LUMPECTOMY WITH RIGHT AXILLARY SENTINEL LYMPH NODE BIOPSY;  Surgeon: Donnie Mesa, MD;  Location: [facility];  Service: General;  Laterality: Right;  LMA & PEC BLOCK   CARDIAC CATHETERIZATION     Normal   CARDIAC CATHETERIZATION N/A 09/30/2014   Procedure: Left Heart Cath and Coronary Angiography;  Surgeon: Sherren Mocha, MD;  Location: [facility];  Service: Cardiovascular;  Laterality: N/A;   LAPAROSCOPIC APPENDECTOMY N/A 06/03/2012   Procedure: APPENDECTOMY LAPAROSCOPIC;  Surgeon: Stark Klein, MD;  Location: [facility];  Service: General;  Laterality: N/A;   Left knee surgery  2008   LEG SURGERY     TONSILLECTOMY     TOTAL HIP ARTHROPLASTY Left 06/11/2018   Procedure: LEFT TOTAL HIP ARTHROPLASTY ANTERIOR APPROACH;  Surgeon: Meredith Pel, MD;  Location: [facility];  Service: Orthopedics;  Laterality: Left;   TOTAL KNEE ARTHROPLASTY Left 08/22/2016   Procedure: TOTAL KNEE ARTHROPLASTY;  Surgeon: Meredith Pel, MD;  Location: [facility];  Service: Orthopedics;  Laterality: Left;   TUBAL LIGATION  1989    SOCIAL HISTORY: Social History   Socioeconomic History   Marital status: Divorced    Spouse name: Not on file   Number of children: 3   Years of education: Not on file   Highest education level: Not on file  Occupational History    Occupation: UNEMPLOYED    Employer: DISABLED  Tobacco Use   Smoking status: Former    Packs/day: 0.50    Years: 15.00    Total pack years: 7.50    Types: Cigarettes    Quit date: 04/24/1985    Years since quitting: 36.7   Smokeless tobacco: Never  Vaping Use   Vaping Use: Never used  Substance and Sexual Activity   Alcohol use: No    Alcohol/week: 0.0 standard drinks of alcohol   Drug use: Not Currently    Types: Marijuana   Sexual activity: Not Currently    Birth control/protection: Surgical    Comment: hyst  Other Topics Concern   Not on file  Social History Narrative  Lives in [place] by herself.  Disabled.  Caffeine 1 cup avg daily.  3 kids, 6 grandkids.     Water aerobics 3 times a week.   Social Determinants of Health   Financial Resource Strain: Medium Risk (11/02/2021)   Overall Financial Resource Strain (CARDIA)    Difficulty of Paying Living Expenses: Somewhat hard  Food Insecurity: Unknown (11/02/2021)   Hunger Vital Sign    Worried About Running Out of Food in the Last Year: Not on file    Ran Out of Food in the Last Year: Never true  Transportation Needs: No Transportation Needs (11/02/2021)   PRAPARE - Hydrologist (Medical): No    Lack of Transportation (Non-Medical): No  Physical Activity: Not on file  Stress: Not on file  Social Connections: Not on file  Intimate Partner Violence: Not on file    FAMILY HISTORY: Family History  Problem Relation Age of Onset   Emphysema Mother    Arthritis Mother    Heart failure Mother        alive @ 57   Stroke Mother    Diabetes Mother    Hypertension Mother    Hyperlipidemia Mother    Depression Mother    Anxiety disorder Mother    Heart disease Mother    Alcoholism Mother    Depression Father    Heart disease Father        died @ 14's.   Stroke Father    Diabetes Father    Hyperlipidemia Father    Hypertension Father    Bipolar disorder Father    Sleep apnea Father     Alcoholism Father    Drug abuse Father    Sudden death Father    Diabetes Sister    Asthma Brother    Breast cancer Maternal Aunt 33 - 26   Colon cancer Maternal Grandfather        dx after 57   Heart disease Paternal Grandfather    Breast cancer Cousin        dx 24s   Esophageal cancer Neg Hx    Stomach cancer Neg Hx    Rectal cancer Neg Hx     ALLERGIES:  is allergic to hydrocodone-acetaminophen, penicillins, shellfish allergy, latex, and sulfa antibiotics.  MEDICATIONS:  Current Outpatient Medications  Medication Sig Dispense Refill   ACCU-CHEK GUIDE test strip      albuterol (PROVENTIL HFA;VENTOLIN HFA) 108 (90 Base) MCG/ACT inhaler Inhale 2 puffs into the lungs every 6 (six) hours as needed for wheezing or shortness of breath.     aspirin 81 MG chewable tablet Chew 81 mg by mouth daily.     atorvastatin (LIPITOR) 40 MG tablet Take 40 mg by mouth at bedtime.      busPIRone (BUSPAR) 30 MG tablet Take 30 mg by mouth 2 (two) times daily.     carvedilol (COREG) 6.25 MG tablet Take 1 tablet (6.25 mg total) by mouth 2 (two) times daily. 180 tablet 3   dexamethasone (DECADRON) 4 MG tablet Take 2 tabs by mouth 2 times daily starting day before chemo. Then take 2 tabs daily for 2 days starting day after chemo. Take with food. 30 tablet 1   diclofenac (VOLTAREN) 75 MG EC tablet Take 75 mg by mouth 2 (two) times daily.     diclofenac Sodium (VOLTAREN) 1 % GEL Apply 2 g topically 4 (four) times daily as needed (joint/muscle pain).     EPINEPHrine 0.3 mg/0.3 mL  IJ SOAJ injection Inject 0.3 mg into the muscle as needed for anaphylaxis. Follow package instructions as needed for severe allergy or anaphylactic reaction. 2 each 3   furosemide (LASIX) 40 MG tablet Take 60 mg by mouth daily.     hydrOXYzine (VISTARIL) 25 MG capsule Take 25 mg by mouth 3 (three) times daily as needed for itching.     isosorbide dinitrate (ISORDIL) 20 MG tablet Take 20 mg by mouth daily.     lidocaine (LIDODERM) 5 %  Place 1 patch onto the skin daily as needed for pain.     lidocaine-prilocaine (EMLA) cream Apply to affected area once 30 g 3   lisinopril (PRINIVIL,ZESTRIL) 20 MG tablet Take 20 mg by mouth daily.      loperamide (IMODIUM) 2 MG capsule Take 1 capsule (2 mg total) by mouth 4 (four) times daily as needed for diarrhea or loose stools. 12 capsule 0   metFORMIN (GLUCOPHAGE) 500 MG tablet Take 1 tablet (500 mg total) by mouth daily with breakfast. 90 tablet 0   MYRBETRIQ 25 MG TB24 tablet Take 1 tablet (25 mg total) by mouth daily. 30 tablet 0   nystatin (MYCOSTATIN/NYSTOP) powder Apply 1 application topically 3 (three) times daily as needed for rash.     ondansetron (ZOFRAN) 8 MG tablet Take 1 tablet (8 mg total) by mouth every 8 (eight) hours as needed for nausea or vomiting. Start on the third day after chemotherapy. 30 tablet 1   ondansetron (ZOFRAN-ODT) 4 MG disintegrating tablet Take 1 tablet (4 mg total) by mouth every 8 (eight) hours as needed for nausea or vomiting. 20 tablet 0   oxyCODONE (OXY IR/ROXICODONE) 5 MG immediate release tablet Take 1 tablet (5 mg total) by mouth every 6 (six) hours as needed for severe pain. 20 tablet 0   pantoprazole (PROTONIX) 40 MG tablet Take 1 tablet (40 mg total) by mouth 2 (two) times daily. 60 tablet 0   potassium chloride (KLOR-CON M10) 10 MEQ tablet Take 1 tablet (10 mEq total) by mouth 2 (two) times daily. 60 tablet 1   prochlorperazine (COMPAZINE) 10 MG tablet Take 1 tablet (10 mg total) by mouth every 6 (six) hours as needed for nausea or vomiting. 30 tablet 1   Semaglutide,0.25 or 0.5MG/DOS, (OZEMPIC, 0.25 OR 0.5 MG/DOSE,) 2 MG/1.5ML SOPN Inject 0.5 mg into the skin once a week. 3 mL 0   sertraline (ZOLOFT) 25 MG tablet Take 1 tablet (25 mg total) by mouth daily. 90 tablet 1   valACYclovir (VALTREX) 1000 MG tablet Take 1 tablet (1,000 mg total) by mouth 2 (two) times daily. 14 tablet 0   Vitamin D, Ergocalciferol, (DRISDOL) 1.25 MG (50000 UNIT) CAPS  capsule Take 1 capsule (50,000 Units total) by mouth every 7 (seven) days. 4 capsule 0   No current facility-administered medications for this visit.   Facility-Administered Medications Ordered in Other Visits  Medication Dose Route Frequency Provider Last Rate Last Admin   cyclophosphamide (CYTOXAN) 1,000 mg in sodium chloride 0.9 % 250 mL chemo infusion  1,000 mg Intravenous Once Keelin Sheridan, MD       DOCEtaxel (TAXOTERE) 120 mg in sodium chloride 0.9 % 250 mL chemo infusion  60 mg/m2 (Treatment Plan Recorded) Intravenous Once [name] Pike, MD 262 mL/hr at 01/20/22 1409 120 mg at 01/20/22 1409    PHYSICAL EXAMINATION: ECOG PERFORMANCE STATUS: 1 - Symptomatic but completely ambulatory  Vitals:   01/20/22 1147  BP: (!) 145/75  Pulse: 80  Resp: 16  Temp: (!) 97.3 F (36.3 C)  SpO2: 100%    Filed Weights   01/20/22 1147  Weight: 206 lb 4.8 oz (93.6 kg)   Physical Exam Constitutional:      Appearance: Normal appearance.  Cardiovascular:     Rate and Rhythm: Normal rate and regular rhythm.  Chest:     Comments: Right breast appears to have healed well.  [place] Abdominal:     General: Abdomen is flat.     Palpations: Abdomen is soft.  Musculoskeletal:        General: No swelling.     Cervical back: Normal range of motion and neck supple. No rigidity.  Lymphadenopathy:     Cervical: No cervical adenopathy.  Skin:    General: Skin is warm and dry.  Neurological:     General: No focal deficit present.     Mental Status: She is alert.      LABORATORY DATA:  I have reviewed the data as listed Lab Results  Component Value Date   WBC 12.0 (H) 01/20/2022   HGB 12.1 01/20/2022   HCT 36.3 01/20/2022   MCV 87.7 01/20/2022   PLT 244 01/20/2022   Lab Results  Component Value Date   NA 139 01/20/2022   K 3.9 01/20/2022   CL 106 01/20/2022   CO2 27 01/20/2022    RADIOGRAPHIC STUDIES: I have personally reviewed the radiological reports and agreed with the  findings in the report.  ASSESSMENT AND PLAN:   Malignant neoplasm of upper-outer quadrant of female breast [facility]) This is a very pleasant 61 year old female patient with past medical history significant for congestive heart failure, coronary artery disease, diabetes, hypertension, dyslipidemia history of pulmonary embolism back in 2008 for which she needed anticoagulation, rheumatoid arthritis, obstructive sleep apnea who is newly diagnosed with right breast IDC noticed on screening mammogram.  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  She is now on adjuvant docetaxel cyclophosphamide, status post cycle 1.  For cycle 1, she had a terrible headache and had to go to the ER for the same, had a CT head which did not show any evidence of pathology.  She also had very bad bone aches.  Other than that she felt that the dose was a bit too strong.  She has noted some tingling and numbness in the tips of her fingers which is not persistent.  Physical examination today benign, no new concerns.  I have dose reduced the chemo by 20% for better tolerance.  She will proceed with chemotherapy as planned today and return to clinic before planned cycle 3.   Total time spent: 30 minutes including history, physical exam, review of records, counseling and coordination of care All questions were answered. The patient knows to call the clinic with any problems, questions or concerns.    [name] Pike, MD 01/20/22

## 2022-01-20 NOTE — Assessment & Plan Note (Signed)
This is a very pleasant 61 year old female patient with past medical history significant for congestive heart failure, coronary artery disease, diabetes, hypertension, dyslipidemia history of pulmonary embolism back in 2008 for which she needed anticoagulation, rheumatoid arthritis, obstructive sleep apnea who is newly diagnosed with right breast IDC noticed on screening mammogram.  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  She is now on adjuvant docetaxel cyclophosphamide, status post cycle 1.  For cycle 1, she had a terrible headache and had to go to the ER for the same, had a CT head which did not show any evidence of pathology.  She also had very bad bone aches.  Other than that she felt that the dose was a bit too strong.  She has noted some tingling and numbness in the tips of her fingers which is not persistent.  Physical examination today benign, no new concerns.  I have dose reduced the chemo by 20% for better tolerance.  She will proceed with chemotherapy as planned today and return to clinic before planned cycle 3.

## 2022-01-20 NOTE — Patient Instructions (Signed)
[facility] ONCOLOGY  Discharge Instructions: Thank you for choosing [facility] to provide your oncology and hematology care.   If you have a lab appointment with the [facility], please go directly to the [facility] and check in at the registration area.   Wear comfortable clothing and clothing appropriate for easy access to any Portacath or PICC line.   We strive to give you quality time with your provider. You may need to reschedule your appointment if you arrive late (15 or more minutes).  Arriving late affects you and other patients whose appointments are after yours.  Also, if you miss three or more appointments without notifying the office, you may be dismissed from the clinic at the provider's discretion.      For prescription refill requests, have your pharmacy contact our office and allow 72 hours for refills to be completed.    Today you received the following chemotherapy and/or immunotherapy agents: docetaxel, cyclophosphamide      To help prevent nausea and vomiting after your treatment, we encourage you to take your nausea medication as directed.  BELOW ARE SYMPTOMS THAT SHOULD BE REPORTED IMMEDIATELY: *FEVER GREATER THAN 100.4 F (38 C) OR HIGHER *CHILLS OR SWEATING *NAUSEA AND VOMITING THAT IS NOT CONTROLLED WITH YOUR NAUSEA MEDICATION *UNUSUAL SHORTNESS OF BREATH *UNUSUAL BRUISING OR BLEEDING *URINARY PROBLEMS (pain or burning when urinating, or frequent urination) *BOWEL PROBLEMS (unusual diarrhea, constipation, pain near the anus) TENDERNESS IN MOUTH AND THROAT WITH OR WITHOUT PRESENCE OF ULCERS (sore throat, sores in mouth, or a toothache) UNUSUAL RASH, SWELLING OR PAIN  UNUSUAL VAGINAL DISCHARGE OR ITCHING   Items with * indicate a potential emergency and should be followed up as soon as possible or go to the Emergency Department if any problems should occur.  Please show the CHEMOTHERAPY ALERT CARD or IMMUNOTHERAPY ALERT  CARD at check-in to the Emergency Department and triage nurse.  Should you have questions after your visit or need to cancel or reschedule your appointment, please contact [facility]  Dept: [phone]  and follow the prompts.  Office hours are 8:00 a.m. to 4:30 p.m. Monday - Friday. Please note that voicemails left after 4:00 p.m. may not be returned until the following business day.  We are closed weekends and major holidays. You have access to a nurse at all times for urgent questions. Please call the main number to the clinic Dept: [phone] and follow the prompts.   For any non-urgent questions, you may also contact your provider using MyChart. We now offer e-Visits for anyone 46 and older to request care online for non-urgent symptoms. For details visit [email or web address].   Also download the MyChart app! Go to the app store, search "MyChart", open the app, select [facility], and log in with your MyChart username and password.  Masks are optional in the cancer centers. If you would like for your care team to wear a mask while they are taking care of you, please let them know. You may have one support person who is at least 62 years old accompany you for your appointments.

## 2022-01-20 NOTE — Research (Signed)
DCP-001: Use of a Clinical Trial Screening Tool to Address Cancer Health Disparities in the [facility] Program [facility])  Met with patient during her scheduled infusion to introduce the above-listed study. Plan to follow-up with patient during her scheduled injection appointment on Monday, 10/2.  Patient [name] was identified by this RN as a potential candidate for the above listed study. This Clinical Research Nurse met with [name], [id number], on 01/20/22 in a manner and location that ensures patient privacy to discuss participation in the above listed research study. Patient is Unaccompanied. A copy of the informed consent document and separate HIPAA Authorization was provided to the patient. Patient reads, speaks, and understands [place]. Patient was previously provided with the business card of this Nurse and encouraged to contact the research team with any questions. Approximately 15 minutes were spent with the patient reviewing the informed consent documents. Patient was provided the option of taking informed consent documents home to review and was encouraged to review at their convenience with their support network, including other care providers. Patient took the consent documents home to review.   Vickii Penna, RN, BSN, CPN Clinical Research Nurse I [phone]  01/20/2022 1:28 PM

## 2022-01-20 NOTE — Progress Notes (Signed)
Called pt to introduce myself as her Financial Resource Specialist and to discuss the [employer] grant.  I left a msg requesting she return my call if she's interested in applying for the grant.  ?

## 2022-01-23 ENCOUNTER — Encounter: Payer: Self-pay | Admitting: Hematology and Oncology

## 2022-01-23 ENCOUNTER — Telehealth: Payer: Self-pay | Admitting: *Deleted

## 2022-01-23 ENCOUNTER — Inpatient Hospital Stay: Payer: Medicare Other | Attending: Hematology and Oncology

## 2022-01-23 VITALS — BP 133/53 | HR 72 | Temp 98.3°F | Resp 17

## 2022-01-23 DIAGNOSIS — Z800 Family history of malignant neoplasm of digestive organs: Secondary | ICD-10-CM | POA: Insufficient documentation

## 2022-01-23 DIAGNOSIS — E669 Obesity, unspecified: Secondary | ICD-10-CM | POA: Diagnosis not present

## 2022-01-23 DIAGNOSIS — Z87891 Personal history of nicotine dependence: Secondary | ICD-10-CM | POA: Insufficient documentation

## 2022-01-23 DIAGNOSIS — E119 Type 2 diabetes mellitus without complications: Secondary | ICD-10-CM | POA: Diagnosis not present

## 2022-01-23 DIAGNOSIS — Z882 Allergy status to sulfonamides status: Secondary | ICD-10-CM | POA: Diagnosis not present

## 2022-01-23 DIAGNOSIS — Z8349 Family history of other endocrine, nutritional and metabolic diseases: Secondary | ICD-10-CM | POA: Insufficient documentation

## 2022-01-23 DIAGNOSIS — Z171 Estrogen receptor negative status [ER-]: Secondary | ICD-10-CM | POA: Diagnosis not present

## 2022-01-23 DIAGNOSIS — Z5189 Encounter for other specified aftercare: Secondary | ICD-10-CM | POA: Insufficient documentation

## 2022-01-23 DIAGNOSIS — C50411 Malignant neoplasm of upper-outer quadrant of right female breast: Secondary | ICD-10-CM | POA: Diagnosis present

## 2022-01-23 DIAGNOSIS — I2510 Atherosclerotic heart disease of native coronary artery without angina pectoris: Secondary | ICD-10-CM | POA: Diagnosis not present

## 2022-01-23 DIAGNOSIS — I10 Essential (primary) hypertension: Secondary | ICD-10-CM | POA: Diagnosis not present

## 2022-01-23 DIAGNOSIS — N6312 Unspecified lump in the right breast, upper inner quadrant: Secondary | ICD-10-CM | POA: Diagnosis not present

## 2022-01-23 DIAGNOSIS — M7989 Other specified soft tissue disorders: Secondary | ICD-10-CM | POA: Diagnosis not present

## 2022-01-23 DIAGNOSIS — E785 Hyperlipidemia, unspecified: Secondary | ICD-10-CM | POA: Diagnosis not present

## 2022-01-23 DIAGNOSIS — Z86711 Personal history of pulmonary embolism: Secondary | ICD-10-CM | POA: Insufficient documentation

## 2022-01-23 DIAGNOSIS — Z8249 Family history of ischemic heart disease and other diseases of the circulatory system: Secondary | ICD-10-CM | POA: Insufficient documentation

## 2022-01-23 DIAGNOSIS — Z79899 Other long term (current) drug therapy: Secondary | ICD-10-CM | POA: Insufficient documentation

## 2022-01-23 DIAGNOSIS — Z8261 Family history of arthritis: Secondary | ICD-10-CM | POA: Insufficient documentation

## 2022-01-23 DIAGNOSIS — Z8673 Personal history of transient ischemic attack (TIA), and cerebral infarction without residual deficits: Secondary | ICD-10-CM | POA: Diagnosis not present

## 2022-01-23 DIAGNOSIS — Z79624 Long term (current) use of inhibitors of nucleotide synthesis: Secondary | ICD-10-CM | POA: Insufficient documentation

## 2022-01-23 DIAGNOSIS — J8489 Other specified interstitial pulmonary diseases: Secondary | ICD-10-CM | POA: Diagnosis not present

## 2022-01-23 DIAGNOSIS — I509 Heart failure, unspecified: Secondary | ICD-10-CM | POA: Insufficient documentation

## 2022-01-23 DIAGNOSIS — Z885 Allergy status to narcotic agent status: Secondary | ICD-10-CM | POA: Insufficient documentation

## 2022-01-23 DIAGNOSIS — I110 Hypertensive heart disease with heart failure: Secondary | ICD-10-CM | POA: Diagnosis not present

## 2022-01-23 DIAGNOSIS — Z825 Family history of asthma and other chronic lower respiratory diseases: Secondary | ICD-10-CM | POA: Insufficient documentation

## 2022-01-23 DIAGNOSIS — Z9049 Acquired absence of other specified parts of digestive tract: Secondary | ICD-10-CM | POA: Insufficient documentation

## 2022-01-23 DIAGNOSIS — Z5986 Financial insecurity: Secondary | ICD-10-CM | POA: Insufficient documentation

## 2022-01-23 DIAGNOSIS — G629 Polyneuropathy, unspecified: Secondary | ICD-10-CM | POA: Diagnosis not present

## 2022-01-23 DIAGNOSIS — Z818 Family history of other mental and behavioral disorders: Secondary | ICD-10-CM | POA: Insufficient documentation

## 2022-01-23 DIAGNOSIS — E079 Disorder of thyroid, unspecified: Secondary | ICD-10-CM | POA: Diagnosis not present

## 2022-01-23 DIAGNOSIS — Z7952 Long term (current) use of systemic steroids: Secondary | ICD-10-CM | POA: Insufficient documentation

## 2022-01-23 DIAGNOSIS — Z833 Family history of diabetes mellitus: Secondary | ICD-10-CM | POA: Insufficient documentation

## 2022-01-23 DIAGNOSIS — Z823 Family history of stroke: Secondary | ICD-10-CM | POA: Insufficient documentation

## 2022-01-23 DIAGNOSIS — Z5111 Encounter for antineoplastic chemotherapy: Secondary | ICD-10-CM | POA: Diagnosis present

## 2022-01-23 DIAGNOSIS — Z803 Family history of malignant neoplasm of breast: Secondary | ICD-10-CM | POA: Insufficient documentation

## 2022-01-23 MED ORDER — PEGFILGRASTIM-CBQV 6 MG/0.6ML ~~LOC~~ SOSY
6.0000 mg | PREFILLED_SYRINGE | Freq: Once | SUBCUTANEOUS | Status: AC
  Administered 2022-01-23: 6 mg via SUBCUTANEOUS
  Filled 2022-01-23: qty 0.6

## 2022-01-23 NOTE — Progress Notes (Signed)
Pt is approved for the $1000 [employer] grant.  

## 2022-01-23 NOTE — Patient Instructions (Signed)

## 2022-01-23 NOTE — Research (Signed)
DCP-001: Use of a Clinical Trial Screening Tool to Address Cancer Health Disparities in the [facility])  Patient declined participation in above-listed study. Patient thanked for her time and consideration.  Vickii Penna, RN, BSN, CPN Clinical Research Nurse I [phone]  01/23/2022 11:35 AM

## 2022-01-23 NOTE — Telephone Encounter (Signed)
This RN spoke with pt post her injection today per her request.  Cicely showed this RN on her right arm - at the fold of her anti cubital - approximately 4 small ( BB size and smaller) areas that were "scabbed over " but now are darker then surrounding skin.  This RN was able to palpate area with no increased warmth noted nor tenderness to patient. Area does not itch and over all looking improved- pt just stated concern.  She also stated " I have speckles on my breast that are red too"  This RN had pt come into an exam room and pt removed her top- noted on the right breast upper mid area 2 very small (pin prick size ) excoriations- again no increased warmth to this breast then other, no palpable areas at area of excoriation. This explained she may have scratched herself without realizing and to keep clean and she can apply neosporin.  Noted as patient was dressing- her bra was front closure with a hook in the front that allowed her to zip up the bra easier. Hook was approximately in the area of the excoriations.  Of note pt's areas of prior "cording" have improved under her right arm.  Pt appreciated evaluation and understands to call if areas do not improve or she has further concerns.  No further needs at this time.

## 2022-01-23 NOTE — Progress Notes (Signed)
Pt returned my call and would like to apply for the [employer].  She will bring her proof of income today, if approved I will give her an expense sheet and my card for any questions or concerns she may have in the future.

## 2022-01-31 ENCOUNTER — Other Ambulatory Visit (INDEPENDENT_AMBULATORY_CARE_PROVIDER_SITE_OTHER): Payer: Self-pay | Admitting: Family Medicine

## 2022-01-31 DIAGNOSIS — K219 Gastro-esophageal reflux disease without esophagitis: Secondary | ICD-10-CM

## 2022-02-09 MED FILL — Dexamethasone Sodium Phosphate Inj 100 MG/10ML: INTRAMUSCULAR | Qty: 1 | Status: AC

## 2022-02-10 ENCOUNTER — Other Ambulatory Visit: Payer: Self-pay | Admitting: Physician Assistant

## 2022-02-10 ENCOUNTER — Inpatient Hospital Stay (HOSPITAL_BASED_OUTPATIENT_CLINIC_OR_DEPARTMENT_OTHER): Payer: Medicare Other | Admitting: Physician Assistant

## 2022-02-10 ENCOUNTER — Inpatient Hospital Stay: Payer: Medicare Other

## 2022-02-10 ENCOUNTER — Other Ambulatory Visit: Payer: Self-pay | Admitting: Hematology and Oncology

## 2022-02-10 VITALS — BP 128/67 | HR 81 | Temp 98.2°F | Resp 17 | Wt 209.5 lb

## 2022-02-10 DIAGNOSIS — G620 Drug-induced polyneuropathy: Secondary | ICD-10-CM | POA: Diagnosis not present

## 2022-02-10 DIAGNOSIS — T451X5A Adverse effect of antineoplastic and immunosuppressive drugs, initial encounter: Secondary | ICD-10-CM

## 2022-02-10 DIAGNOSIS — Z171 Estrogen receptor negative status [ER-]: Secondary | ICD-10-CM | POA: Diagnosis not present

## 2022-02-10 DIAGNOSIS — C50411 Malignant neoplasm of upper-outer quadrant of right female breast: Secondary | ICD-10-CM

## 2022-02-10 DIAGNOSIS — R110 Nausea: Secondary | ICD-10-CM

## 2022-02-10 LAB — CMP (CANCER CENTER ONLY)
ALT: 26 U/L (ref 0–44)
AST: 14 U/L — ABNORMAL LOW (ref 15–41)
Albumin: 4.2 g/dL (ref 3.5–5.0)
Alkaline Phosphatase: 74 U/L (ref 38–126)
Anion gap: 10 (ref 5–15)
BUN: 23 mg/dL (ref 8–23)
CO2: 25 mmol/L (ref 22–32)
Calcium: 9.6 mg/dL (ref 8.9–10.3)
Chloride: 106 mmol/L (ref 98–111)
Creatinine: 0.92 mg/dL (ref 0.44–1.00)
GFR, Estimated: >60 mL/min (ref >60)
Glucose, Bld: 269 mg/dL — ABNORMAL HIGH (ref 70–99)
Potassium: 3.7 mmol/L (ref 3.5–5.1)
Sodium: 141 mmol/L (ref 135–145)
Total Bilirubin: 0.4 mg/dL (ref 0.3–1.2)
Total Protein: 6.9 g/dL (ref 6.5–8.1)

## 2022-02-10 LAB — CBC WITH DIFFERENTIAL (CANCER CENTER ONLY)
Abs Immature Granulocytes: 0.11 10*3/uL — ABNORMAL HIGH (ref 0.00–0.07)
Basophils Absolute: 0 10*3/uL (ref 0.0–0.1)
Basophils Relative: 0 %
Eosinophils Absolute: 0 10*3/uL (ref 0.0–0.5)
Eosinophils Relative: 0 %
HCT: 34.6 % — ABNORMAL LOW (ref 36.0–46.0)
Hemoglobin: 11.6 g/dL — ABNORMAL LOW (ref 12.0–15.0)
Immature Granulocytes: 1 %
Lymphocytes Relative: 10 %
Lymphs Abs: 1.3 10*3/uL (ref 0.7–4.0)
MCH: 29.7 pg (ref 26.0–34.0)
MCHC: 33.5 g/dL (ref 30.0–36.0)
MCV: 88.7 fL (ref 80.0–100.0)
Monocytes Absolute: 0.7 10*3/uL (ref 0.1–1.0)
Monocytes Relative: 5 %
Neutro Abs: 10.2 10*3/uL — ABNORMAL HIGH (ref 1.7–7.7)
Neutrophils Relative %: 84 %
Platelet Count: 224 10*3/uL (ref 150–400)
RBC: 3.9 MIL/uL (ref 3.87–5.11)
RDW: 15.9 % — ABNORMAL HIGH (ref 11.5–15.5)
WBC Count: 12.2 10*3/uL — ABNORMAL HIGH (ref 4.0–10.5)
nRBC: 0 % (ref 0.0–0.2)

## 2022-02-10 MED ORDER — PALONOSETRON HCL INJECTION 0.25 MG/5ML
0.2500 mg | Freq: Once | INTRAVENOUS | Status: AC
  Administered 2022-02-10: 0.25 mg via INTRAVENOUS
  Filled 2022-02-10: qty 5

## 2022-02-10 MED ORDER — SODIUM CHLORIDE 0.9 % IV SOLN: Freq: Once | INTRAVENOUS | Status: AC

## 2022-02-10 MED ORDER — ONDANSETRON HCL 4 MG/2ML IJ SOLN: 4.0000 mg | Freq: Once | INTRAMUSCULAR | Status: DC

## 2022-02-10 MED ORDER — ONDANSETRON HCL 4 MG/2ML IJ SOLN
INTRAMUSCULAR | Status: AC
  Administered 2022-02-10: 4 mg
  Filled 2022-02-10: qty 2

## 2022-02-10 MED ORDER — SODIUM CHLORIDE 0.9 % IV SOLN
520.0000 mg/m2 | Freq: Once | INTRAVENOUS | Status: AC
  Administered 2022-02-10: 1000 mg via INTRAVENOUS
  Filled 2022-02-10: qty 50

## 2022-02-10 MED ORDER — SODIUM CHLORIDE 0.9 % IV SOLN
10.0000 mg | Freq: Once | INTRAVENOUS | Status: AC
  Administered 2022-02-10: 10 mg via INTRAVENOUS
  Filled 2022-02-10: qty 10

## 2022-02-10 MED ORDER — SODIUM CHLORIDE 0.9 % IV SOLN
60.0000 mg/m2 | Freq: Once | INTRAVENOUS | Status: AC
  Administered 2022-02-10: 120 mg via INTRAVENOUS
  Filled 2022-02-10: qty 12

## 2022-02-10 NOTE — Patient Instructions (Signed)
[facility]  Discharge Instructions: Thank you for choosing [facility] to provide your oncology and hematology care.   If you have a lab appointment with the [facility], please go directly to the [facility] and check in at the registration area.   Wear comfortable clothing and clothing appropriate for easy access to any Portacath or PICC line.   We strive to give you quality time with your provider. You may need to reschedule your appointment if you arrive late (15 or more minutes).  Arriving late affects you and other patients whose appointments are after yours.  Also, if you miss three or more appointments without notifying the office, you may be dismissed from the clinic at the provider's discretion.      For prescription refill requests, have your pharmacy contact our office and allow 72 hours for refills to be completed.    Today you received the following chemotherapy and/or immunotherapy agents Taxotere & Cytoxan      To help prevent nausea and vomiting after your treatment, we encourage you to take your nausea medication as directed.  BELOW ARE SYMPTOMS THAT SHOULD BE REPORTED IMMEDIATELY: *FEVER GREATER THAN 100.4 F (38 C) OR HIGHER *CHILLS OR SWEATING *NAUSEA AND VOMITING THAT IS NOT CONTROLLED WITH YOUR NAUSEA MEDICATION *UNUSUAL SHORTNESS OF BREATH *UNUSUAL BRUISING OR BLEEDING *URINARY PROBLEMS (pain or burning when urinating, or frequent urination) *BOWEL PROBLEMS (unusual diarrhea, constipation, pain near the anus) TENDERNESS IN MOUTH AND THROAT WITH OR WITHOUT PRESENCE OF ULCERS (sore throat, sores in mouth, or a toothache) UNUSUAL RASH, SWELLING OR PAIN  UNUSUAL VAGINAL DISCHARGE OR ITCHING   Items with * indicate a potential emergency and should be followed up as soon as possible or go to the Emergency Department if any problems should occur.  Please show the CHEMOTHERAPY ALERT CARD or IMMUNOTHERAPY ALERT CARD at  check-in to the Emergency Department and triage nurse.  Should you have questions after your visit or need to cancel or reschedule your appointment, please contact [facility]  Dept: [phone]  and follow the prompts.  Office hours are 8:00 a.m. to 4:30 p.m. Monday - Friday. Please note that voicemails left after 4:00 p.m. may not be returned until the following business day.  We are closed weekends and major holidays. You have access to a nurse at all times for urgent questions. Please call the main number to the clinic Dept: [phone] and follow the prompts.   For any non-urgent questions, you may also contact your provider using MyChart. We now offer e-Visits for anyone 101 and older to request care online for non-urgent symptoms. For details visit [email or web address].   Also download the MyChart app! Go to the app store, search "MyChart", open the app, select [facility], and log in with your MyChart username and password.  Masks are optional in the cancer centers. If you would like for your care team to wear a mask while they are taking care of you, please let them know. You may have one support person who is at least 62 years old accompany you for your appointments.

## 2022-02-10 NOTE — Progress Notes (Signed)
Symptom Management Consult note [facility]    Patient Care Team: Sonia Side., FNP as PCP - General (Family Medicine) Josue Hector, MD as PCP - Cardiology (Cardiology) Mauro Kaufmann, RN as Oncology Nurse Navigator Rockwell Germany, RN as Oncology Nurse Navigator Donnie Mesa, MD as Consulting Physician (General Surgery) [name] Pike, MD as Consulting Physician (Hematology and Oncology) Kyung Rudd, MD as Consulting Physician (Radiation Oncology)    Name of the patient: [name]  [id number]  Sep 17, 1960   Date of visit: 02/10/2022   Chief Complaint/Reason for visit: toxicity check   Current Therapy: Cytoxan, Taxotere  Last treatment:  Day 1   Cycle 2 on 01/14/22   ASSESSMENT & PLAN: Patient is a 61 y.o. female  with oncologic history of right breast IDC followed by Dr. Chryl Heck.  I have viewed most recent oncology note and lab work.    #) Right breast IDC -Here for toxicity check. -CBC with WBC 12.2 and ANC 10.2, likely related to dexamethasone p.o. that she took yesterday.  Infection less likely with lack of fever or infectious symptoms. -Hemoglobin 11.6 consistent with baseline.  Normal platelets. -CMP showing glucose of 269 again suspect related to steroid.  Patient is known diabetic on metformin and insulin. - Next appointment with oncologist is 03/03/22 before cycle 4   #) Peripheral neuropathy -Patient had dose reduction by 20% last cycle.   -Neuropathy is the same. Discussed with Dr. Chryl Heck who agree with plans to proceed with treatment today as labs are within parameter and neuropathy has not worsened.  #) Symptom management: Hand and feet swelling, intermittent SOB -Extremity swelling: Exam without significant edema.  She is compliant with her Lasix. Edema resolves with elevation. Encouraged to watch dietary salt intake. -SOB: Intermittent, denies any currently, not new.  She has normal work of breathing and clear lung exam.   -Taxanes do have adverse effect of the interstitial pneumonitis which her exam today is not consistent with. I instructed patient and daughter to closely monitor breathing for any changes.Offered patient to hold treatment today and he reassessed next week, she does not feel that is necessary based on how minimal SOB has been when present.  Strict ED precautions discussed should symptoms worsen.    Heme/Onc History: Oncology History  Malignant neoplasm of upper-outer quadrant of female breast ([facility])  10/07/2021 Mammogram   Right breast, possible mass warrants further evaluation, no findings suspicious for malignancy in the left breast.Targeted ultrasound is performed, showing a 8 x 6 x 7 mm irregular mass at the right breast 1 o'clock 13 cm from nipple correlating to the mammographic mass. Ultrasound of the right axilla is negative.   10/27/2021 Pathology Results   Per verbal report at [facility] today, grade 3 IDC, triple negative, Ki 67 25%   11/02/2021 Initial Diagnosis   Malignant neoplasm of upper-outer quadrant of female breast ([facility])   11/21/2021 Genetic Testing   No pathogenic variants detected in [facility] CustomNext-Cancer +RNAinsight Panel.  Report date is November 21, 2021.   The CustomNext-Cancer+RNAinsight panel offered by [facility] includes sequencing and rearrangement analysis for the following 47 genes:  APC, ATM, AXIN2, BARD1, BMPR1A, BRCA1, BRCA2, BRIP1, CDH1, CDK4, CDKN2A, CHEK2, DICER1, EPCAM, GREM1, HOXB13, MEN1, MLH1, MSH2, MSH3, MSH6, MUTYH, NBN, NF1, NF2, NTHL1, PALB2, PMS2, POLD1, POLE, PTEN, RAD51C, RAD51D, RECQL, RET, SDHA, SDHAF2, SDHB, SDHC, SDHD, SMAD4, SMARCA4, STK11, TP53, TSC1, TSC2, and VHL.  RNA data is routinely analyzed for use  in variant interpretation for all genes.   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  Prior prognostic  showed ER 0% negative PR 0% negative, HER2 low with IHC of 1+ and Ki-67 of 25%.   12/30/2021 -  Chemotherapy   Patient is on Treatment Plan : BREAST TC q21d         Interval history-: METHA KOLASA is a 61 y.o. female with oncologic history as above seen in the infusion center today with chief complaint of toxicity check.  Patient accompanied by her daughter who provides additional history.    Patient tells me since last treatment she is overall doing well. Her dose was reduced last treatment because of peripheral neuropathy.  Her peripheral neuropathy is about the same, she does not think it has worsened.  Patient has noticed intermittent swelling in her hands and feet for the last 2 weeks.  She states the swelling always resolves overnight and she does not notice it when she wakes up the next day.  She denies any associated pain.  She denies high salt diet.  She takes Lasix daily for CHF.  She also takes steroids the day before treatment which she admits compliance with.  She does also mention shortness of breath.  She states that has been intermittent ever since she diagnosed with breast cancer.  She denies it being any worse lately and denies any currently.  She gives the example of feeling winded while out shopping all day over the weekend last week.  She denies any associated chest pain or diaphoresis with the shortness of breath.  Denies any sick contacts.  Denies any fever or chills.      ROS  All other systems are reviewed and are negative for acute change except as noted in the HPI.    Allergies  Allergen Reactions   Hydrocodone-Acetaminophen Itching and Other (See Comments)    confusion   Penicillins Swelling and Other (See Comments)    Has patient had a PCN reaction causing immediate rash, facial/tongue/throat swelling, SOB or lightheadedness with hypotension: YES Has patient had a PCN reaction causing severe rash involving mucus membranes or skin necrosis: NO Has patient  had a PCN reaction that required hospitalization NO Has patient had a PCN reaction occurring within the last 10 years: NO If all of the above answers are "NO", then may proceed with Cephalosporin use.   Shellfish Allergy Hives   Latex Rash   Sulfa Antibiotics Diarrhea, Itching and Rash     Past Medical History:  Diagnosis Date   Allergy    Anginal pain ([facility])    a. NL cath in 2008;  b. Myoview 03/2011: dec uptake along mid anterior wall on stress imaging -> ? attenuation vs. ischemia, EF 65%;  c. 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 2008 -> coumadin x 6 mos.   Rheumatoid arthritis ([facility])    Sleep apnea    on CPAP 02/2018   SOB (shortness of breath)    Thyroid disease    TIA (transient ischemic attack)    Urinary urgency    Vitamin D deficiency      Past Surgical History:  Procedure Laterality Date   ABDOMINAL HYSTERECTOMY  2005   APPENDECTOMY     BREAST LUMPECTOMY WITH RADIOACTIVE SEED AND SENTINEL LYMPH NODE BIOPSY Right 11/16/2021   Procedure: RIGHT BREAST RADIOACTIVE SEED LOCALIZED LUMPECTOMY WITH RIGHT AXILLARY SENTINEL LYMPH NODE BIOPSY;  Surgeon: Donnie Mesa, MD;  Location: [facility];  Service: General;  Laterality: Right;  LMA & PEC BLOCK   CARDIAC  CATHETERIZATION     Normal   CARDIAC CATHETERIZATION N/A 09/30/2014   Procedure: Left Heart Cath and Coronary Angiography;  Surgeon: Sherren Mocha, MD;  Location: [facility];  Service: Cardiovascular;  Laterality: N/A;   LAPAROSCOPIC APPENDECTOMY N/A 06/03/2012   Procedure: APPENDECTOMY LAPAROSCOPIC;  Surgeon: Stark Klein, MD;  Location: [facility];  Service: General;  Laterality: N/A;   Left knee surgery  2008   LEG SURGERY     TONSILLECTOMY     TOTAL HIP ARTHROPLASTY Left 06/11/2018   Procedure: LEFT TOTAL HIP ARTHROPLASTY ANTERIOR APPROACH;  Surgeon: Meredith Pel, MD;  Location: [facility];  Service: Orthopedics;  Laterality: Left;   TOTAL KNEE ARTHROPLASTY Left 08/22/2016   Procedure: TOTAL KNEE ARTHROPLASTY;  Surgeon: Meredith Pel, MD;  Location: [facility];  Service: Orthopedics;  Laterality: Left;   TUBAL LIGATION  1989    Social History   Socioeconomic History   Marital status: Divorced    Spouse name: Not on file   Number of children: 3   Years of education: Not on file   Highest education level: Not on file  Occupational History   Occupation: UNEMPLOYED    Employer: DISABLED  Tobacco Use   Smoking status: Former    Packs/day: 0.50    Years: 15.00    Total pack years: 7.50    Types: Cigarettes    Quit date: 04/24/1985    Years since quitting: 36.8   Smokeless tobacco: Never  Vaping Use   Vaping Use: Never used  Substance and Sexual Activity   Alcohol use: No    Alcohol/week: 0.0 standard drinks of alcohol   Drug use: Not Currently    Types: Marijuana   Sexual activity: Not Currently    Birth control/protection: Surgical    Comment: hyst  Other Topics Concern   Not on file  Social History Narrative   Lives in [place] by herself.  Disabled.  Caffeine 1 cup avg daily.  3 kids, 6 grandkids.     Water aerobics 3 times a week.   Social Determinants of Health   Financial Resource Strain: Medium Risk (11/02/2021)   Overall Financial Resource Strain (CARDIA)     Difficulty of Paying Living Expenses: Somewhat hard  Food Insecurity: Unknown (11/02/2021)   Hunger  Vital Sign    Worried About Charity fundraiser in the Last Year: Not on file    Ran Out of Food in the Last Year: Never true  Transportation Needs: No Transportation Needs (11/02/2021)   PRAPARE - Hydrologist (Medical): No    Lack of Transportation (Non-Medical): No  Physical Activity: Not on file  Stress: Not on file  Social Connections: Not on file  Intimate Partner Violence: Not on file    Family History  Problem Relation Age of Onset   Emphysema Mother    Arthritis Mother    Heart failure Mother        alive @ 72   Stroke Mother    Diabetes Mother    Hypertension Mother    Hyperlipidemia Mother    Depression Mother    Anxiety disorder Mother    Heart disease Mother    Alcoholism Mother    Depression Father    Heart disease Father        died @ 3's.   Stroke Father    Diabetes Father    Hyperlipidemia Father    Hypertension Father    Bipolar disorder Father    Sleep apnea Father    Alcoholism Father    Drug abuse Father    Sudden death Father    Diabetes Sister    Asthma Brother    Breast cancer Maternal Aunt 85 - 40   Colon cancer Maternal Grandfather        dx after 65   Heart disease Paternal Grandfather    Breast cancer Cousin        dx 77s   Esophageal cancer Neg Hx    Stomach cancer Neg Hx    Rectal cancer Neg Hx      Current Outpatient Medications:    ACCU-CHEK GUIDE test strip, , Disp: , Rfl:    albuterol (PROVENTIL HFA;VENTOLIN HFA) 108 (90 Base) MCG/ACT inhaler, Inhale 2 puffs into the lungs every 6 (six) hours as needed for wheezing or shortness of breath., Disp: , Rfl:    aspirin 81 MG chewable tablet, Chew 81 mg by mouth daily., Disp: , Rfl:    atorvastatin (LIPITOR) 40 MG tablet, Take 40 mg by mouth at bedtime. , Disp: , Rfl:    busPIRone (BUSPAR) 30 MG tablet, Take 30 mg by mouth 2 (two) times daily., Disp: ,  Rfl:    carvedilol (COREG) 6.25 MG tablet, Take 1 tablet (6.25 mg total) by mouth 2 (two) times daily., Disp: 180 tablet, Rfl: 3   dexamethasone (DECADRON) 4 MG tablet, Take 2 tabs by mouth 2 times daily starting day before chemo. Then take 2 tabs daily for 2 days starting day after chemo. Take with food., Disp: 30 tablet, Rfl: 1   diclofenac (VOLTAREN) 75 MG EC tablet, Take 75 mg by mouth 2 (two) times daily., Disp: , Rfl:    diclofenac Sodium (VOLTAREN) 1 % GEL, Apply 2 g topically 4 (four) times daily as needed (joint/muscle pain)., Disp: , Rfl:    EPINEPHrine 0.3 mg/0.3 mL IJ SOAJ injection, Inject 0.3 mg into the muscle as needed for anaphylaxis. Follow package instructions as needed for severe allergy or anaphylactic reaction., Disp: 2 each, Rfl: 3   furosemide (LASIX) 40 MG tablet, Take 60 mg by mouth daily., Disp: , Rfl:    hydrOXYzine (VISTARIL) 25 MG capsule, Take 25 mg by mouth 3 (three) times daily as needed for itching., Disp: , Rfl:  isosorbide dinitrate (ISORDIL) 20 MG tablet, Take 20 mg by mouth daily., Disp: , Rfl:    lidocaine (LIDODERM) 5 %, Place 1 patch onto the skin daily as needed for pain., Disp: , Rfl:    lidocaine-prilocaine (EMLA) cream, Apply to affected area once, Disp: 30 g, Rfl: 3   lisinopril (PRINIVIL,ZESTRIL) 20 MG tablet, Take 20 mg by mouth daily. , Disp: , Rfl:    loperamide (IMODIUM) 2 MG capsule, Take 1 capsule (2 mg total) by mouth 4 (four) times daily as needed for diarrhea or loose stools., Disp: 12 capsule, Rfl: 0   metFORMIN (GLUCOPHAGE) 500 MG tablet, Take 1 tablet (500 mg total) by mouth daily with breakfast., Disp: 90 tablet, Rfl: 0   MYRBETRIQ 25 MG TB24 tablet, Take 1 tablet (25 mg total) by mouth daily., Disp: 30 tablet, Rfl: 0   nystatin (MYCOSTATIN/NYSTOP) powder, Apply 1 application topically 3 (three) times daily as needed for rash., Disp: , Rfl:    ondansetron (ZOFRAN) 8 MG tablet, Take 1 tablet (8 mg total) by mouth every 8 (eight) hours as  needed for nausea or vomiting. Start on the third day after chemotherapy., Disp: 30 tablet, Rfl: 1   ondansetron (ZOFRAN-ODT) 4 MG disintegrating tablet, Take 1 tablet (4 mg total) by mouth every 8 (eight) hours as needed for nausea or vomiting., Disp: 20 tablet, Rfl: 0   oxyCODONE (OXY IR/ROXICODONE) 5 MG immediate release tablet, Take 1 tablet (5 mg total) by mouth every 6 (six) hours as needed for severe pain., Disp: 20 tablet, Rfl: 0   pantoprazole (PROTONIX) 40 MG tablet, Take 1 tablet (40 mg total) by mouth 2 (two) times daily., Disp: 60 tablet, Rfl: 0   potassium chloride (KLOR-CON M10) 10 MEQ tablet, Take 1 tablet (10 mEq total) by mouth 2 (two) times daily., Disp: 60 tablet, Rfl: 1   prochlorperazine (COMPAZINE) 10 MG tablet, Take 1 tablet (10 mg total) by mouth every 6 (six) hours as needed for nausea or vomiting., Disp: 30 tablet, Rfl: 1   Semaglutide,0.25 or 0.5MG/DOS, (OZEMPIC, 0.25 OR 0.5 MG/DOSE,) 2 MG/1.5ML SOPN, Inject 0.5 mg into the skin once a week., Disp: 3 mL, Rfl: 0   sertraline (ZOLOFT) 25 MG tablet, Take 1 tablet (25 mg total) by mouth daily., Disp: 90 tablet, Rfl: 1   valACYclovir (VALTREX) 1000 MG tablet, Take 1 tablet (1,000 mg total) by mouth 2 (two) times daily., Disp: 14 tablet, Rfl: 0   Vitamin D, Ergocalciferol, (DRISDOL) 1.25 MG (50000 UNIT) CAPS capsule, Take 1 capsule (50,000 Units total) by mouth every 7 (seven) days., Disp: 4 capsule, Rfl: 0 No current facility-administered medications for this visit.  Facility-Administered Medications Ordered in Other Visits:    cyclophosphamide (CYTOXAN) 1,000 mg in sodium chloride 0.9 % 250 mL chemo infusion, 520 mg/m2 (Treatment Plan Recorded), Intravenous, Once, Iruku, Praveena, MD   DOCEtaxel (TAXOTERE) 120 mg in sodium chloride 0.9 % 250 mL chemo infusion, 60 mg/m2 (Treatment Plan Recorded), Intravenous, Once, Iruku, Praveena, MD, Last Rate: 262 mL/hr at 02/10/22 1539, 120 mg at 02/10/22 1539  PHYSICAL EXAM: ECOG FS:1 -  Symptomatic but completely ambulatory   Wt: 209 lbs   T: 98.2   BP: 131/66   HR: 87    O2: 99% on RA Physical Exam Vitals and nursing note reviewed.  Constitutional:      Appearance: She is well-developed. She is not ill-appearing or toxic-appearing.  HENT:     Head: Normocephalic.     Nose: Nose normal.  Eyes:     Conjunctiva/sclera: Conjunctivae normal.  Neck:     Vascular: No JVD.  Cardiovascular:     Rate and Rhythm: Normal rate and regular rhythm.     Pulses: Normal pulses.     Heart sounds: Normal heart sounds.  Pulmonary:     Effort: Pulmonary effort is normal. No respiratory distress.     Breath sounds: Normal breath sounds. No stridor. No wheezing, rhonchi or rales.     Comments: Oxygen saturation 97-99% during exam on room air Chest:     Chest wall: No tenderness.  Abdominal:     General: There is no distension.  Musculoskeletal:     Cervical back: Normal range of motion.     Comments: Homans sign absent bilaterally, no palpable cords, compartments are soft. Very minimal non pitting pedal edema.  No edema noted to bilateral upper extremities.    Skin:    General: Skin is warm and dry.     Capillary Refill: Capillary refill takes less than 2 seconds.     Comments: Equal tactile temperature in all extremities  Neurological:     Mental Status: She is oriented to person, place, and time.        LABORATORY DATA: I have reviewed the data as listed    Latest Ref Rng & Units 02/10/2022   12:43 PM 01/20/2022   11:34 AM 01/06/2022    9:23 PM  CBC  WBC 4.0 - 10.5 K/uL 12.2  12.0  5.7   Hemoglobin 12.0 - 15.0 g/dL 11.6  12.1  12.3   Hematocrit 36.0 - 46.0 % 34.6  36.3  38.6   Platelets 150 - 400 K/uL 224  244  164         Latest Ref Rng & Units 02/10/2022   12:43 PM 01/20/2022   11:34 AM 01/06/2022    9:23 PM  CMP  Glucose 70 - 99 mg/dL 269  296  165   BUN 8 - 23 mg/dL _0 Creatinine 0.44 - 1.00 mg/dL 0.92  0.84  1.04   Sodium 135 - 145 mmol/L  141  139  146   Potassium 3.5 - 5.1 mmol/L 3.7  3.9  4.1   Chloride 98 - 111 mmol/L 106  106  112   CO2 22 - 32 mmol/L _1 Calcium 8.9 - 10.3 mg/dL 9.6  9.7  9.3   Total Protein 6.5 - 8.1 g/dL 6.9  7.0  6.5   Total Bilirubin 0.3 - 1.2 mg/dL 0.4  0.4  0.5   Alkaline Phos 38 - 126 U/L 74  66  58   AST 15 - 41 U/L _2 ALT 0 - 44 U/L _3 RADIOGRAPHIC STUDIES (from last 24 hours if applicable) I have personally reviewed the radiological images as listed and agreed with the findings in the report. No results found.      Visit Diagnosis: 1. Malignant neoplasm of upper-outer quadrant of right breast in female, estrogen receptor negative ([facility])   2. Peripheral neuropathy due to chemotherapy ([facility])      No orders of the defined types were placed in this encounter.   All questions were answered. The patient knows to call the clinic with any problems, questions or concerns. No barriers to learning was detected.  I have spent a total of 20 minutes minutes  of face-to-face and non-face-to-face time, preparing to see the patient, obtaining and/or reviewing separately obtained history, performing a medically appropriate examination, counseling and educating the patient, ordering tests, documenting clinical information in the electronic health record, and care coordination (communications with other health care professionals or caregivers).    Thank you for allowing me to participate in the care of this patient.    Barrie Folk, PA-C Department of Hematology/Oncology [facility] Phone: [phone]  [phone]    02/10/2022 4:35 PM

## 2022-02-13 ENCOUNTER — Ambulatory Visit: Payer: Medicare Other | Attending: Surgery

## 2022-02-13 ENCOUNTER — Other Ambulatory Visit: Payer: Self-pay

## 2022-02-13 ENCOUNTER — Inpatient Hospital Stay: Payer: Medicare Other

## 2022-02-13 VITALS — BP 130/69 | HR 68 | Temp 98.0°F | Resp 18

## 2022-02-13 DIAGNOSIS — C50411 Malignant neoplasm of upper-outer quadrant of right female breast: Secondary | ICD-10-CM

## 2022-02-13 DIAGNOSIS — R293 Abnormal posture: Secondary | ICD-10-CM | POA: Insufficient documentation

## 2022-02-13 DIAGNOSIS — Z171 Estrogen receptor negative status [ER-]: Secondary | ICD-10-CM | POA: Insufficient documentation

## 2022-02-13 DIAGNOSIS — Z483 Aftercare following surgery for neoplasm: Secondary | ICD-10-CM | POA: Insufficient documentation

## 2022-02-13 MED ORDER — PEGFILGRASTIM-CBQV 6 MG/0.6ML ~~LOC~~ SOSY
6.0000 mg | PREFILLED_SYRINGE | Freq: Once | SUBCUTANEOUS | Status: AC
  Administered 2022-02-13: 6 mg via SUBCUTANEOUS
  Filled 2022-02-13: qty 0.6

## 2022-02-26 ENCOUNTER — Encounter (HOSPITAL_COMMUNITY): Payer: Self-pay

## 2022-02-26 ENCOUNTER — Emergency Department (HOSPITAL_BASED_OUTPATIENT_CLINIC_OR_DEPARTMENT_OTHER): Payer: Medicare Other

## 2022-02-26 ENCOUNTER — Emergency Department (HOSPITAL_COMMUNITY)
Admission: EM | Admit: 2022-02-26 | Discharge: 2022-02-26 | Disposition: A | Discharge status: Home / Self Care | Payer: Medicare Other | Attending: Emergency Medicine | Admitting: Emergency Medicine

## 2022-02-26 DIAGNOSIS — R600 Localized edema: Secondary | ICD-10-CM | POA: Diagnosis not present

## 2022-02-26 DIAGNOSIS — D649 Anemia, unspecified: Secondary | ICD-10-CM | POA: Insufficient documentation

## 2022-02-26 DIAGNOSIS — Z853 Personal history of malignant neoplasm of breast: Secondary | ICD-10-CM | POA: Diagnosis not present

## 2022-02-26 DIAGNOSIS — Z794 Long term (current) use of insulin: Secondary | ICD-10-CM | POA: Diagnosis not present

## 2022-02-26 DIAGNOSIS — I509 Heart failure, unspecified: Secondary | ICD-10-CM | POA: Diagnosis not present

## 2022-02-26 DIAGNOSIS — L03116 Cellulitis of left lower limb: Secondary | ICD-10-CM | POA: Insufficient documentation

## 2022-02-26 DIAGNOSIS — Z91040 Latex allergy status: Secondary | ICD-10-CM | POA: Diagnosis not present

## 2022-02-26 DIAGNOSIS — R739 Hyperglycemia, unspecified: Secondary | ICD-10-CM

## 2022-02-26 DIAGNOSIS — L03115 Cellulitis of right lower limb: Secondary | ICD-10-CM

## 2022-02-26 DIAGNOSIS — R52 Pain, unspecified: Secondary | ICD-10-CM

## 2022-02-26 DIAGNOSIS — J45909 Unspecified asthma, uncomplicated: Secondary | ICD-10-CM | POA: Diagnosis not present

## 2022-02-26 DIAGNOSIS — I110 Hypertensive heart disease with heart failure: Secondary | ICD-10-CM | POA: Diagnosis not present

## 2022-02-26 DIAGNOSIS — E1165 Type 2 diabetes mellitus with hyperglycemia: Secondary | ICD-10-CM | POA: Diagnosis not present

## 2022-02-26 DIAGNOSIS — M7989 Other specified soft tissue disorders: Secondary | ICD-10-CM | POA: Diagnosis not present

## 2022-02-26 DIAGNOSIS — Z7982 Long term (current) use of aspirin: Secondary | ICD-10-CM | POA: Diagnosis not present

## 2022-02-26 DIAGNOSIS — Z7984 Long term (current) use of oral hypoglycemic drugs: Secondary | ICD-10-CM | POA: Insufficient documentation

## 2022-02-26 DIAGNOSIS — Z79899 Other long term (current) drug therapy: Secondary | ICD-10-CM | POA: Diagnosis not present

## 2022-02-26 LAB — COMPREHENSIVE METABOLIC PANEL
ALT: 21 U/L (ref 0–44)
AST: 18 U/L (ref 15–41)
Albumin: 3.3 g/dL — ABNORMAL LOW (ref 3.5–5.0)
Alkaline Phosphatase: 69 U/L (ref 38–126)
Anion gap: 6 (ref 5–15)
BUN: 13 mg/dL (ref 8–23)
CO2: 27 mmol/L (ref 22–32)
Calcium: 8.4 mg/dL — ABNORMAL LOW (ref 8.9–10.3)
Chloride: 108 mmol/L (ref 98–111)
Creatinine, Ser: 0.9 mg/dL (ref 0.44–1.00)
GFR, Estimated: >60 mL/min (ref >60)
Glucose, Bld: 296 mg/dL — ABNORMAL HIGH (ref 70–99)
Potassium: 3.2 mmol/L — ABNORMAL LOW (ref 3.5–5.1)
Sodium: 141 mmol/L (ref 135–145)
Total Bilirubin: 0.4 mg/dL (ref 0.3–1.2)
Total Protein: 5.9 g/dL — ABNORMAL LOW (ref 6.5–8.1)

## 2022-02-26 LAB — CBC WITH DIFFERENTIAL/PLATELET
Abs Immature Granulocytes: 0.06 10*3/uL (ref 0.00–0.07)
Basophils Absolute: 0.1 10*3/uL (ref 0.0–0.1)
Basophils Relative: 1 %
Eosinophils Absolute: 0 10*3/uL (ref 0.0–0.5)
Eosinophils Relative: 0 %
HCT: 32.6 % — ABNORMAL LOW (ref 36.0–46.0)
Hemoglobin: 10 g/dL — ABNORMAL LOW (ref 12.0–15.0)
Immature Granulocytes: 1 %
Lymphocytes Relative: 21 %
Lymphs Abs: 1.6 10*3/uL (ref 0.7–4.0)
MCH: 29.7 pg (ref 26.0–34.0)
MCHC: 30.7 g/dL (ref 30.0–36.0)
MCV: 96.7 fL (ref 80.0–100.0)
Monocytes Absolute: 0.6 10*3/uL (ref 0.1–1.0)
Monocytes Relative: 8 %
Neutro Abs: 5.1 10*3/uL (ref 1.7–7.7)
Neutrophils Relative %: 69 %
Platelets: 149 10*3/uL — ABNORMAL LOW (ref 150–400)
RBC: 3.37 MIL/uL — ABNORMAL LOW (ref 3.87–5.11)
RDW: 17.1 % — ABNORMAL HIGH (ref 11.5–15.5)
WBC: 7.4 10*3/uL (ref 4.0–10.5)
nRBC: 0 % (ref 0.0–0.2)

## 2022-02-26 LAB — PROTIME-INR
INR: 1 (ref 0.8–1.2)
Prothrombin Time: 13 seconds (ref 11.4–15.2)

## 2022-02-26 MED ORDER — DOXYCYCLINE HYCLATE 100 MG PO TABS
100.0000 mg | ORAL_TABLET | Freq: Once | ORAL | Status: AC
  Administered 2022-02-26: 100 mg via ORAL
  Filled 2022-02-26: qty 1

## 2022-02-26 MED ORDER — DOXYCYCLINE HYCLATE 100 MG PO TABS: 100.0000 mg | ORAL_TABLET | Freq: Two times a day (BID) | ORAL | 0 refills | Status: AC

## 2022-02-26 NOTE — Progress Notes (Signed)
RLE venous duplex has been completed.  Preliminary results given to [facility], PA-C.  Results can be found under chart review under CV PROC. 02/26/2022 5:11 PM Terance Pomplun RVT, RDMS

## 2022-02-26 NOTE — ED Provider Triage Note (Signed)
Emergency Medicine Provider Triage Evaluation Note  [name] , a 61 y.o. female  was evaluated in triage.  Pt complains of right leg swelling and leg pain.  Pt is currently receiving chemo  Review of Systems  Positive: Swelling right leg Negative: fever  Physical Exam  BP 110/64 (BP Location: Left Arm)   Pulse 84   Temp 98.2 F (36.8 C) (Oral)   Resp 20   SpO2 100%  Gen:   Awake, no distress   Resp:  Normal effort  MSK:   Moves extremities without difficulty  Other:  Edema bilat legs right greater than left  Medical Decision Making  Medically screening exam initiated at 12:31 PM.  Appropriate orders placed.  [name] was informed that the remainder of the evaluation will be completed by another provider, this initial triage assessment does not replace that evaluation, and the importance of remaining in the ED until their evaluation is complete.     Fransico [name], [place] 02/26/22 1232

## 2022-02-26 NOTE — Discharge Instructions (Addendum)
Your blood sugar was slightly elevated today.  Continue your diabetes medications and monitor your sugar.  Take the antibiotics as prescribed.  Keep your legs elevated when resting to help with the swelling.  Follow-up with your doctor in a week to be rechecked to make sure your symptoms are improving.  Return to the ED if for worsening symptoms including fevers chills, increasing redness or other concerns

## 2022-02-26 NOTE — ED Triage Notes (Signed)
Pt arrived via POV, c/o bilateral leg swelling and pain. Woke with nose bleed yesterday, also c/o headache.

## 2022-02-26 NOTE — ED Provider Notes (Signed)
[facility] DEPT Provider Note   CSN: [id number] Arrival date & time: 02/26/22  1112     History  Chief Complaint  Patient presents with   Leg Swelling    [name] is a 61 y.o. female.  HPI   Patient has a history of hypertension, diabetes, asthma, depression, pulmonary embolism, CHF, leg edema, hyperlipidemia, IBS and breast cancer.  Patient is undergoing chemotherapy.  October 20 she received palonosetron dexamethasone Cytoxan and Taxotere.  On October 23 patient had Pegfilgrastim.  Patient presents to the ED with complaints of headaches.  She has had some intermittent nosebleeds.  She is also had aches and pains in both legs but more specifically more pain and swelling in the right leg.  She also felt the back of her right calf was tense and tender.  Patient denies any fevers. Home Medications Prior to Admission medications   Medication Sig Start Date End Date Taking? Authorizing Provider  doxycycline (VIBRA-TABS) 100 MG tablet Take 1 tablet (100 mg total) by mouth 2 (two) times daily for 7 days. 02/26/22 03/05/22 Yes Dorie Rank, MD  ACCU-CHEK GUIDE test strip  10/28/21   [provider]  albuterol (PROVENTIL HFA;VENTOLIN HFA) 108 (90 Base) MCG/ACT inhaler Inhale 2 puffs into the lungs every 6 (six) hours as needed for wheezing or shortness of breath.    [provider]  aspirin 81 MG chewable tablet Chew 81 mg by mouth daily.    [provider]  atorvastatin (LIPITOR) 40 MG tablet Take 40 mg by mouth at bedtime.     [provider]  busPIRone (BUSPAR) 30 MG tablet Take 30 mg by mouth 2 (two) times daily. 12/18/19   [provider]  carvedilol (COREG) 6.25 MG tablet Take 1 tablet (6.25 mg total) by mouth 2 (two) times daily. 09/06/21   Josue Hector, MD  dexamethasone (DECADRON) 4 MG tablet Take 2 tabs by mouth 2 times daily starting day before chemo. Then take 2 tabs daily for 2 days starting day  after chemo. Take with food. 12/20/21   [name] Pike, MD  diclofenac (VOLTAREN) 75 MG EC tablet Take 75 mg by mouth 2 (two) times daily. 12/02/21   [provider]  diclofenac Sodium (VOLTAREN) 1 % GEL Apply 2 g topically 4 (four) times daily as needed (joint/muscle pain). 09/20/19   [provider]  EPINEPHrine 0.3 mg/0.3 mL IJ SOAJ injection Inject 0.3 mg into the muscle as needed for anaphylaxis. Follow package instructions as needed for severe allergy or anaphylactic reaction. 11/06/21   Carrie Mew, MD  furosemide (LASIX) 40 MG tablet Take 60 mg by mouth daily.    Rai, Vernelle Emerald, MD  hydrOXYzine (VISTARIL) 25 MG capsule Take 25 mg by mouth 3 (three) times daily as needed for itching.    [provider]  isosorbide dinitrate (ISORDIL) 20 MG tablet Take 20 mg by mouth daily.    [provider]  lidocaine (LIDODERM) 5 % Place 1 patch onto the skin daily as needed for pain. 02/11/21   [provider]  lidocaine-prilocaine (EMLA) cream Apply to affected area once 12/20/21   [name] Pike, MD  lisinopril (PRINIVIL,ZESTRIL) 20 MG tablet Take 20 mg by mouth daily.  09/21/14   [provider]  loperamide (IMODIUM) 2 MG capsule Take 1 capsule (2 mg total) by mouth 4 (four) times daily as needed for diarrhea or loose stools. 01/07/22   Long, Wonda Olds, MD  metFORMIN (GLUCOPHAGE) 500 MG  tablet Take 1 tablet (500 mg total) by mouth daily with breakfast. 12/19/21   Laqueta Linden, MD  MYRBETRIQ 25 MG TB24 tablet Take 1 tablet (25 mg total) by mouth daily. 03/13/19   Dennard Nip D, MD  nystatin (MYCOSTATIN/NYSTOP) powder Apply 1 application topically 3 (three) times daily as needed for rash.    [provider]  ondansetron (ZOFRAN) 8 MG tablet Take 1 tablet (8 mg total) by mouth every 8 (eight) hours as needed for nausea or vomiting. Start on the third day after chemotherapy. 12/20/21   [name] Pike, MD  ondansetron (ZOFRAN-ODT) 4 MG  disintegrating tablet Take 1 tablet (4 mg total) by mouth every 8 (eight) hours as needed for nausea or vomiting. 01/07/22   Long, Wonda Olds, MD  oxyCODONE (OXY IR/ROXICODONE) 5 MG immediate release tablet Take 1 tablet (5 mg total) by mouth every 6 (six) hours as needed for severe pain. 11/16/21   Donnie Mesa, MD  pantoprazole (PROTONIX) 40 MG tablet Take 1 tablet (40 mg total) by mouth 2 (two) times daily. 03/09/21   Domenic Polite, MD  potassium chloride (KLOR-CON M10) 10 MEQ tablet Take 1 tablet (10 mEq total) by mouth 2 (two) times daily. 05/27/18   Jearld Lesch A, DO  prochlorperazine (COMPAZINE) 10 MG tablet Take 1 tablet (10 mg total) by mouth every 6 (six) hours as needed for nausea or vomiting. 12/20/21   [name] Pike, MD  Semaglutide,0.25 or 0.'5MG'$ /DOS, (OZEMPIC, 0.25 OR 0.5 MG/DOSE,) 2 MG/1.5ML SOPN Inject 0.5 mg into the skin once a week. 01/11/22   Laqueta Linden, MD  sertraline (ZOLOFT) 25 MG tablet Take 1 tablet (25 mg total) by mouth daily. 01/11/22   Laqueta Linden, MD  valACYclovir (VALTREX) 1000 MG tablet Take 1 tablet (1,000 mg total) by mouth 2 (two) times daily. 01/09/22   Nicholas Lose, MD  Vitamin D, Ergocalciferol, (DRISDOL) 1.25 MG (50000 UNIT) CAPS capsule Take 1 capsule (50,000 Units total) by mouth every 7 (seven) days. 01/11/22   Laqueta Linden, MD      Allergies    Hydrocodone-acetaminophen, Penicillins, Shellfish allergy, Latex, and Sulfa antibiotics    Review of Systems   Review of Systems  Physical Exam Updated Vital Signs BP 114/78   Pulse 76   Temp 98.5 F (36.9 C) (Oral)   Resp 17   SpO2 99%  Physical Exam Vitals and nursing note reviewed.  Constitutional:      General: She is not in acute distress.    Appearance: She is well-developed.  HENT:     Head: Normocephalic and atraumatic.     Right Ear: External ear normal.     Left Ear: External ear normal.     Nose: No rhinorrhea.     Comments: No epistaxis noted    Mouth/Throat:      Mouth: Mucous membranes are moist.  Eyes:     General: No scleral icterus.       Right eye: No discharge.        Left eye: No discharge.     Conjunctiva/sclera: Conjunctivae normal.  Neck:     Trachea: No tracheal deviation.  Cardiovascular:     Rate and Rhythm: Normal rate and regular rhythm.  Pulmonary:     Effort: Pulmonary effort is normal. No respiratory distress.     Breath sounds: Normal breath sounds. No stridor. No wheezing or rales.  Abdominal:     General: Bowel sounds are normal. There is no distension.  Palpations: Abdomen is soft.     Tenderness: There is no abdominal tenderness. There is no guarding or rebound.  Musculoskeletal:        General: No tenderness or deformity.     Cervical back: Neck supple.     Comments: Trace edema bilateral ankles, tenderness to palpation right calf, no lymphangitic streaking  Skin:    General: Skin is warm and dry.     Findings: No rash.  Neurological:     General: No focal deficit present.     Mental Status: She is alert.     Cranial Nerves: No cranial nerve deficit (no facial droop, extraocular movements intact, no slurred speech).     Sensory: No sensory deficit.     Motor: No abnormal muscle tone or seizure activity.     Coordination: Coordination normal.  Psychiatric:        Mood and Affect: Mood normal.     ED Results / Procedures / Treatments   Labs (all labs ordered are listed, but only abnormal results are displayed) Labs Reviewed  CBC WITH DIFFERENTIAL/PLATELET - Abnormal; Notable for the following components:      Result Value   RBC 3.37 (*)    Hemoglobin 10.0 (*)    HCT 32.6 (*)    RDW 17.1 (*)    Platelets 149 (*)    All other components within normal limits  COMPREHENSIVE METABOLIC PANEL - Abnormal; Notable for the following components:   Potassium 3.2 (*)    Glucose, Bld 296 (*)    Calcium 8.4 (*)    Total Protein 5.9 (*)    Albumin 3.3 (*)    All other components within normal limits   PROTIME-INR    EKG EKG Interpretation  Date/Time:  Sunday February 26 2022 12:42:23 EST Ventricular Rate:  80 PR Interval:  193 QRS Duration: 121 QT Interval:  371 QTC Calculation: 428 R Axis:   49 Text Interpretation: Sinus rhythm Nonspecific intraventricular conduction delay Anteroseptal infarct, old Nonspecific T abnormalities, lateral leads No significant change since last tracing Confirmed by Dorie Rank [phone]) on 02/26/2022 7:06:54 PM  Radiology VAS [name] LOWER EXTREMITY VENOUS (DVT) (ONLY [facility] & [facility])  Result Date: 02/26/2022  Lower Venous DVT Study Patient Name:  [name]  Date of Exam:   02/26/2022 Medical Rec #: [id number]            Accession #:    [id number] Date of Birth: 1961-02-22            Patient Gender: F Patient Age:   61 years Exam Location:  [facility] Procedure:      VAS [name] LOWER EXTREMITY VENOUS (DVT) Referring Phys: LESLIE SOFIA --------------------------------------------------------------------------------  Indications: Pain, and Swelling.  Risk Factors: Chemotherapy. Limitations: Poor ultrasound/tissue interface. Comparison Study: Previous exams on 03/07/21, 05/19/20, 08/18/19, and 08/14/17 all                   negative for DVT Performing Technologist: Rogelia Rohrer RVT, RDMS  Examination Guidelines: A complete evaluation includes B-mode imaging, spectral Doppler, color Doppler, and power Doppler as needed of all accessible portions of each vessel. Bilateral testing is considered an integral part of a complete examination. Limited examinations for reoccurring indications may be performed as noted. The reflux portion of the exam is performed with the patient in reverse Trendelenburg.  +---------+---------------+---------+-----------+----------+--------------+ RIGHT    CompressibilityPhasicitySpontaneityPropertiesThrombus Aging +---------+---------------+---------+-----------+----------+--------------+ CFV      Full  Yes      Yes                                  +---------+---------------+---------+-----------+----------+--------------+ SFJ      Full                                                        +---------+---------------+---------+-----------+----------+--------------+ FV Prox  Full           Yes      Yes                                 +---------+---------------+---------+-----------+----------+--------------+ FV Mid   Full           Yes      Yes                                 +---------+---------------+---------+-----------+----------+--------------+ FV DistalFull           Yes      Yes                                 +---------+---------------+---------+-----------+----------+--------------+ PFV      Full                                                        +---------+---------------+---------+-----------+----------+--------------+ POP      Full                                                        +---------+---------------+---------+-----------+----------+--------------+ PTV      Full                                                        +---------+---------------+---------+-----------+----------+--------------+ PERO     Full                                                        +---------+---------------+---------+-----------+----------+--------------+   +----+---------------+---------+-----------+----------+--------------+ LEFTCompressibilityPhasicitySpontaneityPropertiesThrombus Aging +----+---------------+---------+-----------+----------+--------------+ CFV Full           Yes      Yes                                 +----+---------------+---------+-----------+----------+--------------+    Summary: RIGHT: - There is no evidence of deep vein thrombosis in the lower extremity.  - No cystic structure found in the  popliteal fossa. - Ultrasound characteristics of enlarged lymph nodes are noted in the groin. Subcutaneous edema noted in area of calf and ankle  LEFT: - No  evidence of common femoral vein obstruction. - Ultrasound characteristics of enlarged lymph nodes noted in the groin.  *See table(s) above for measurements and observations.    Preliminary     Procedures Procedures    Medications Ordered in ED Medications  doxycycline (VIBRA-TABS) tablet 100 mg (100 mg Oral Given 02/26/22 1933)    ED Course/ Medical Decision Making/ A&P Clinical Course as of 02/26/22 1941  Sun Feb 26, 2022  1904 CBC with Differential(!) Labs reviewed.  No leukocytosis, hemoglobin decreased compared to previous values [JK]  1905 Comprehensive metabolic panel(!) Hyperglycemia noted, no signs of acidosis, normal anion gap [JK]  1905 VAS [name] LOWER EXTREMITY VENOUS (DVT) (ONLY [facility] & [facility]) Ultrasound shows evidence of edema but no signs of venous thrombosis [JK]    Clinical Course User Index [JK] Dorie Rank, MD                           Medical Decision Making Differential diagnosis includes but not limited to thrombocytopenia, EMEA, acute infection, DVT  Problems Addressed: Anemia, unspecified type: undiagnosed new problem with uncertain prognosis Cellulitis of right lower extremity: acute illness or injury Leg edema: acute illness or injury  Amount and/or Complexity of Data Reviewed Labs: ordered. Decision-making details documented in ED Course. Radiology:  Decision-making details documented in ED Course.  Risk Prescription drug management.   Patient's ED work-up is reassuring.  She does have mild anemia.  This is slightly worse than previous values.  She has had some nasal bleeding.  It is also possible this could be related to her chemotherapy.  No GI bleeding symptoms.  She does not have significant thrombocytopenia and her INR is normal.  Patient is hyperglycemic but she does have a history of diabetes.  No signs of DKA.  Encouraged continued home medication regimen use.  With her complaints of leg tenderness and swelling.  Ultrasound does not show DVT but it  does show some lymphadenopathy.  It is possible she may be developing a cellulitis.  We will try her on a course of oral antibiotics.  Close outpatient follow-up with her primary doctor and/or oncologist          Final Clinical Impression(s) / ED Diagnoses Final diagnoses:  Leg edema  Cellulitis of right lower extremity  Anemia, unspecified type  Hyperglycemia    Rx / DC Orders ED Discharge Orders          Ordered    doxycycline (VIBRA-TABS) 100 MG tablet  2 times daily        02/26/22 1926              Dorie Rank, MD 02/26/22 1941

## 2022-02-28 ENCOUNTER — Other Ambulatory Visit (INDEPENDENT_AMBULATORY_CARE_PROVIDER_SITE_OTHER): Payer: Self-pay | Admitting: Family Medicine

## 2022-02-28 DIAGNOSIS — E1165 Type 2 diabetes mellitus with hyperglycemia: Secondary | ICD-10-CM

## 2022-03-02 ENCOUNTER — Encounter: Payer: Self-pay | Admitting: General Surgery

## 2022-03-02 MED FILL — Dexamethasone Sodium Phosphate Inj 100 MG/10ML: INTRAMUSCULAR | Qty: 1 | Status: AC

## 2022-03-03 ENCOUNTER — Inpatient Hospital Stay: Payer: Medicare Other

## 2022-03-03 ENCOUNTER — Other Ambulatory Visit: Payer: Self-pay

## 2022-03-03 ENCOUNTER — Inpatient Hospital Stay (HOSPITAL_BASED_OUTPATIENT_CLINIC_OR_DEPARTMENT_OTHER): Payer: Medicare Other | Admitting: Hematology and Oncology

## 2022-03-03 ENCOUNTER — Encounter: Payer: Self-pay | Admitting: *Deleted

## 2022-03-03 ENCOUNTER — Inpatient Hospital Stay: Payer: Medicare Other | Attending: Hematology and Oncology

## 2022-03-03 ENCOUNTER — Encounter: Payer: Self-pay | Admitting: Hematology and Oncology

## 2022-03-03 ENCOUNTER — Other Ambulatory Visit: Payer: Self-pay | Admitting: *Deleted

## 2022-03-03 VITALS — BP 123/69 | HR 78 | Temp 97.7°F | Resp 16 | Ht 59.0 in | Wt 215.0 lb

## 2022-03-03 DIAGNOSIS — Z5189 Encounter for other specified aftercare: Secondary | ICD-10-CM | POA: Insufficient documentation

## 2022-03-03 DIAGNOSIS — Z880 Allergy status to penicillin: Secondary | ICD-10-CM | POA: Insufficient documentation

## 2022-03-03 DIAGNOSIS — Z8349 Family history of other endocrine, nutritional and metabolic diseases: Secondary | ICD-10-CM | POA: Insufficient documentation

## 2022-03-03 DIAGNOSIS — Z171 Estrogen receptor negative status [ER-]: Secondary | ICD-10-CM

## 2022-03-03 DIAGNOSIS — Z8673 Personal history of transient ischemic attack (TIA), and cerebral infarction without residual deficits: Secondary | ICD-10-CM | POA: Insufficient documentation

## 2022-03-03 DIAGNOSIS — Z885 Allergy status to narcotic agent status: Secondary | ICD-10-CM | POA: Diagnosis not present

## 2022-03-03 DIAGNOSIS — C50411 Malignant neoplasm of upper-outer quadrant of right female breast: Secondary | ICD-10-CM | POA: Insufficient documentation

## 2022-03-03 DIAGNOSIS — M7989 Other specified soft tissue disorders: Secondary | ICD-10-CM

## 2022-03-03 DIAGNOSIS — I509 Heart failure, unspecified: Secondary | ICD-10-CM | POA: Insufficient documentation

## 2022-03-03 DIAGNOSIS — Z9049 Acquired absence of other specified parts of digestive tract: Secondary | ICD-10-CM | POA: Diagnosis not present

## 2022-03-03 DIAGNOSIS — Z811 Family history of alcohol abuse and dependence: Secondary | ICD-10-CM | POA: Insufficient documentation

## 2022-03-03 DIAGNOSIS — I110 Hypertensive heart disease with heart failure: Secondary | ICD-10-CM | POA: Insufficient documentation

## 2022-03-03 DIAGNOSIS — I2510 Atherosclerotic heart disease of native coronary artery without angina pectoris: Secondary | ICD-10-CM | POA: Insufficient documentation

## 2022-03-03 DIAGNOSIS — G4730 Sleep apnea, unspecified: Secondary | ICD-10-CM | POA: Insufficient documentation

## 2022-03-03 DIAGNOSIS — Z86711 Personal history of pulmonary embolism: Secondary | ICD-10-CM | POA: Diagnosis not present

## 2022-03-03 DIAGNOSIS — N6312 Unspecified lump in the right breast, upper inner quadrant: Secondary | ICD-10-CM | POA: Insufficient documentation

## 2022-03-03 DIAGNOSIS — R599 Enlarged lymph nodes, unspecified: Secondary | ICD-10-CM | POA: Diagnosis not present

## 2022-03-03 DIAGNOSIS — Z836 Family history of other diseases of the respiratory system: Secondary | ICD-10-CM | POA: Insufficient documentation

## 2022-03-03 DIAGNOSIS — Z823 Family history of stroke: Secondary | ICD-10-CM | POA: Insufficient documentation

## 2022-03-03 DIAGNOSIS — Z79624 Long term (current) use of inhibitors of nucleotide synthesis: Secondary | ICD-10-CM | POA: Diagnosis not present

## 2022-03-03 DIAGNOSIS — Z825 Family history of asthma and other chronic lower respiratory diseases: Secondary | ICD-10-CM | POA: Insufficient documentation

## 2022-03-03 DIAGNOSIS — Z8249 Family history of ischemic heart disease and other diseases of the circulatory system: Secondary | ICD-10-CM | POA: Insufficient documentation

## 2022-03-03 DIAGNOSIS — Z882 Allergy status to sulfonamides status: Secondary | ICD-10-CM | POA: Diagnosis not present

## 2022-03-03 DIAGNOSIS — E119 Type 2 diabetes mellitus without complications: Secondary | ICD-10-CM | POA: Diagnosis not present

## 2022-03-03 DIAGNOSIS — Z818 Family history of other mental and behavioral disorders: Secondary | ICD-10-CM | POA: Insufficient documentation

## 2022-03-03 DIAGNOSIS — Z87891 Personal history of nicotine dependence: Secondary | ICD-10-CM | POA: Insufficient documentation

## 2022-03-03 DIAGNOSIS — Z833 Family history of diabetes mellitus: Secondary | ICD-10-CM | POA: Diagnosis not present

## 2022-03-03 DIAGNOSIS — Z803 Family history of malignant neoplasm of breast: Secondary | ICD-10-CM | POA: Insufficient documentation

## 2022-03-03 DIAGNOSIS — Z8261 Family history of arthritis: Secondary | ICD-10-CM | POA: Insufficient documentation

## 2022-03-03 DIAGNOSIS — Z79899 Other long term (current) drug therapy: Secondary | ICD-10-CM | POA: Diagnosis not present

## 2022-03-03 DIAGNOSIS — Z800 Family history of malignant neoplasm of digestive organs: Secondary | ICD-10-CM | POA: Insufficient documentation

## 2022-03-03 DIAGNOSIS — Z5111 Encounter for antineoplastic chemotherapy: Secondary | ICD-10-CM | POA: Diagnosis not present

## 2022-03-03 DIAGNOSIS — Z5986 Financial insecurity: Secondary | ICD-10-CM | POA: Insufficient documentation

## 2022-03-03 LAB — CBC WITH DIFFERENTIAL (CANCER CENTER ONLY)
Abs Immature Granulocytes: 0.13 10*3/uL — ABNORMAL HIGH (ref 0.00–0.07)
Basophils Absolute: 0 10*3/uL (ref 0.0–0.1)
Basophils Relative: 0 %
Eosinophils Absolute: 0 10*3/uL (ref 0.0–0.5)
Eosinophils Relative: 0 %
HCT: 32.2 % — ABNORMAL LOW (ref 36.0–46.0)
Hemoglobin: 10.5 g/dL — ABNORMAL LOW (ref 12.0–15.0)
Immature Granulocytes: 1 %
Lymphocytes Relative: 11 %
Lymphs Abs: 1.2 10*3/uL (ref 0.7–4.0)
MCH: 30.3 pg (ref 26.0–34.0)
MCHC: 32.6 g/dL (ref 30.0–36.0)
MCV: 92.8 fL (ref 80.0–100.0)
Monocytes Absolute: 0.8 10*3/uL (ref 0.1–1.0)
Monocytes Relative: 7 %
Neutro Abs: 8.9 10*3/uL — ABNORMAL HIGH (ref 1.7–7.7)
Neutrophils Relative %: 81 %
Platelet Count: 226 10*3/uL (ref 150–400)
RBC: 3.47 MIL/uL — ABNORMAL LOW (ref 3.87–5.11)
RDW: 17 % — ABNORMAL HIGH (ref 11.5–15.5)
WBC Count: 11 10*3/uL — ABNORMAL HIGH (ref 4.0–10.5)
nRBC: 0 % (ref 0.0–0.2)

## 2022-03-03 LAB — CMP (CANCER CENTER ONLY)
ALT: 18 U/L (ref 0–44)
AST: 14 U/L — ABNORMAL LOW (ref 15–41)
Albumin: 3.9 g/dL (ref 3.5–5.0)
Alkaline Phosphatase: 61 U/L (ref 38–126)
Anion gap: 9 (ref 5–15)
BUN: 18 mg/dL (ref 8–23)
CO2: 27 mmol/L (ref 22–32)
Calcium: 9.6 mg/dL (ref 8.9–10.3)
Chloride: 108 mmol/L (ref 98–111)
Creatinine: 0.85 mg/dL (ref 0.44–1.00)
GFR, Estimated: >60 mL/min (ref >60)
Glucose, Bld: 130 mg/dL — ABNORMAL HIGH (ref 70–99)
Potassium: 3.6 mmol/L (ref 3.5–5.1)
Sodium: 144 mmol/L (ref 135–145)
Total Bilirubin: 0.5 mg/dL (ref 0.3–1.2)
Total Protein: 6.8 g/dL (ref 6.5–8.1)

## 2022-03-03 MED ORDER — SODIUM CHLORIDE 0.9 % IV SOLN
10.0000 mg | Freq: Once | INTRAVENOUS | Status: AC
  Administered 2022-03-03: 10 mg via INTRAVENOUS
  Filled 2022-03-03: qty 10

## 2022-03-03 MED ORDER — SODIUM CHLORIDE 0.9 % IV SOLN
60.0000 mg/m2 | Freq: Once | INTRAVENOUS | Status: AC
  Administered 2022-03-03: 120 mg via INTRAVENOUS
  Filled 2022-03-03: qty 12

## 2022-03-03 MED ORDER — PALONOSETRON HCL INJECTION 0.25 MG/5ML
0.2500 mg | Freq: Once | INTRAVENOUS | Status: AC
  Administered 2022-03-03: 0.25 mg via INTRAVENOUS
  Filled 2022-03-03: qty 5

## 2022-03-03 MED ORDER — SODIUM CHLORIDE 0.9 % IV SOLN: Freq: Once | INTRAVENOUS | Status: AC

## 2022-03-03 MED ORDER — SODIUM CHLORIDE 0.9 % IV SOLN
520.0000 mg/m2 | Freq: Once | INTRAVENOUS | Status: AC
  Administered 2022-03-03: 1000 mg via INTRAVENOUS
  Filled 2022-03-03: qty 50

## 2022-03-03 NOTE — Patient Instructions (Signed)
[facility] ONCOLOG[facility]  Discharge Instructions: Thank you for choosing [facility] to provide your oncology and hematology care.   If you have a lab appointment with the [facility], please go directly to the [facility] and check in at the registration area.   Wear comfortable clothing and clothing appropriate for easy access to any Portacath or PICC line.   We strive to give you quality time with your provider. You may need to reschedule your appointment if you arrive late (15 or more minutes).  Arriving late affects you and other patients whose appointments are after yours.  Also, if you miss three or more appointments without notifying the office, you may be dismissed from the clinic at the provider's discretion.      For prescription refill requests, have your pharmacy contact our office and allow 72 hours for refills to be completed.    Today you received the following chemotherapy and/or immunotherapy agents: Docetaxel, Cytoxan.   To help prevent nausea and vomiting after your treatment, we encourage you to take your nausea medication as directed.  BELOW ARE SYMPTOMS THAT SHOULD BE REPORTED IMMEDIATELY: *FEVER GREATER THAN 100.4 F (38 C) OR HIGHER *CHILLS OR SWEATING *NAUSEA AND VOMITING THAT IS NOT CONTROLLED WITH YOUR NAUSEA MEDICATION *UNUSUAL SHORTNESS OF BREATH *UNUSUAL BRUISING OR BLEEDING *URINARY PROBLEMS (pain or burning when urinating, or frequent urination) *BOWEL PROBLEMS (unusual diarrhea, constipation, pain near the anus) TENDERNESS IN MOUTH AND THROAT WITH OR WITHOUT PRESENCE OF ULCERS (sore throat, sores in mouth, or a toothache) UNUSUAL RASH, SWELLING OR PAIN  UNUSUAL VAGINAL DISCHARGE OR ITCHING   Items with * indicate a potential emergency and should be followed up as soon as possible or go to the Emergency Department if any problems should occur.  Please show the CHEMOTHERAPY ALERT CARD or IMMUNOTHERAPY ALERT CARD at  check-in to the Emergency Department and triage nurse.  Should you have questions after your visit or need to cancel or reschedule your appointment, please contact [facility]  Dept: [phone]  and follow the prompts.  Office hours are 8:00 a.m. to 4:30 p.m. Monday - Friday. Please note that voicemails left after 4:00 p.m. may not be returned until the following business day.  We are closed weekends and major holidays. You have access to a nurse at all times for urgent questions. Please call the main number to the clinic Dept: [phone] and follow the prompts.   For any non-urgent questions, you may also contact your provider using MyChart. We now offer e-Visits for anyone 32 and older to request care online for non-urgent symptoms. For details visit [email or web address].   Also download the MyChart app! Go to the app store, search "MyChart", open the app, select [facility], and log in with your MyChart username and password.  Masks are optional in the cancer centers. If you would like for your care team to wear a mask while they are taking care of you, please let them know. You may have one support person who is at least 62 years old accompany you for your appointments.

## 2022-03-03 NOTE — Progress Notes (Signed)
[place] NOTE  Patient Care Team: Sonia Side., FNP as PCP - General (Family Medicine) Josue Hector, MD as PCP - Cardiology (Cardiology) Mauro Kaufmann, RN as Oncology Nurse Navigator Rockwell Germany, RN as Oncology Nurse Navigator Donnie Mesa, MD as Consulting Physician (General Surgery) [name] Pike, MD as Consulting Physician (Hematology and Oncology) Kyung Rudd, MD as Consulting Physician (Radiation Oncology)  CHIEF COMPLAINTS/PURPOSE OF CONSULTATION:  Breast cancer follow-up before cycle 4 of TC HISTORY OF PRESENTING ILLNESS:   [name] 61 y.o. female is here because of recent diagnosis of right breast IDC  I reviewed her records extensively and collaborated the history with the patient.  SUMMARY OF ONCOLOGIC HISTORY: Oncology History  Malignant neoplasm of upper-outer quadrant of female breast ([place])  10/07/2021 Mammogram   Right breast, possible mass warrants further evaluation, no findings suspicious for malignancy in the left breast.Targeted ultrasound is performed, showing a 8 x 6 x 7 mm irregular mass at the right breast 1 o'clock 13 cm from nipple correlating to the mammographic mass. Ultrasound of the right axilla is negative.   10/27/2021 Pathology Results   Per verbal report at breast [facility] today, grade 3 IDC, triple negative, Ki 67 25%   11/02/2021 Initial Diagnosis   Malignant neoplasm of upper-outer quadrant of female breast ([place])   11/21/2021 Genetic Testing   No pathogenic variants detected in [facility] CustomNext-Cancer +RNAinsight Panel.  Report date is November 21, 2021.   The CustomNext-Cancer+RNAinsight panel offered by [facility] includes sequencing and rearrangement analysis for the following 47 genes:  APC, ATM, AXIN2, BARD1, BMPR1A, BRCA1, BRCA2, BRIP1, CDH1, CDK4, CDKN2A, CHEK2, DICER1, EPCAM, GREM1, HOXB13, MEN1, MLH1, MSH2, MSH3, MSH6, MUTYH, NBN, NF1, NF2, NTHL1, PALB2, PMS2, POLD1, POLE, PTEN, RAD51C,  RAD51D, RECQL, RET, SDHA, SDHAF2, SDHB, SDHC, SDHD, SMAD4, SMARCA4, STK11, TP53, TSC1, TSC2, and VHL.  RNA data is routinely analyzed for use in variant interpretation for all genes.   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  Prior prognostic showed ER 0% negative PR 0% negative, HER2 low with IHC of 1+ and Ki-67 of 25%.   12/30/2021 -  Chemotherapy   Patient is on Treatment Plan : BREAST TC q21d      Interval History  She is here for a follow up before planned cycle 4 of TC.  She most recently was seen with bilateral lower extremity swelling in the ER, followed up with her PCP as well as cardiology.  She tells me that she takes Lasix 60 mg daily at baseline.  Her cardiologist did not think it is related to her heart.  She however is also very worried because she was told about some groin lymphadenopathy when she had her vascular ultrasound while she was in the ER and she wondered if this could be related to breast cancer.  Besides lower extremity swelling, she denies any other chest pain, cough, shortness of breath. No neuropathy reported. History of PE in 2008, took anticoagulation.  MEDICAL HISTORY:  Past Medical History:  Diagnosis Date   Allergy    Anginal pain ([place])    a. NL cath in 2008;  b. Myoview 03/2011: dec uptake along mid anterior wall on stress imaging -> ? attenuation vs. ischemia, EF 65%;  c. 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 2008 -> coumadin x 6 mos.   Rheumatoid arthritis ([place])    Sleep apnea    on CPAP 02/2018   SOB (shortness of breath)    Thyroid disease    TIA (transient ischemic attack)    Urinary urgency    Vitamin D deficiency     SURGICAL HISTORY: Past Surgical History:  Procedure Laterality Date   ABDOMINAL HYSTERECTOMY  2005   APPENDECTOMY     BREAST LUMPECTOMY WITH RADIOACTIVE SEED AND SENTINEL LYMPH NODE BIOPSY Right 11/16/2021   Procedure: RIGHT BREAST RADIOACTIVE SEED LOCALIZED LUMPECTOMY WITH RIGHT AXILLARY SENTINEL LYMPH NODE BIOPSY;  Surgeon: Donnie Mesa, MD;  Location: [facility];  Service: General;  Laterality: Right;  LMA & PEC BLOCK   CARDIAC CATHETERIZATION     Normal   CARDIAC CATHETERIZATION N/A 09/30/2014   Procedure: Left Heart Cath and Coronary Angiography;  Surgeon: Sherren Mocha, MD;  Location: [facility];  Service: Cardiovascular;  Laterality: N/A;   LAPAROSCOPIC APPENDECTOMY N/A 06/03/2012   Procedure: APPENDECTOMY LAPAROSCOPIC;  Surgeon: Stark Klein, MD;  Location: [facility];  Service: General;  Laterality: N/A;   Left knee surgery  2008   LEG SURGERY     TONSILLECTOMY     TOTAL HIP ARTHROPLASTY Left 06/11/2018   Procedure: LEFT TOTAL HIP ARTHROPLASTY ANTERIOR APPROACH;  Surgeon: Meredith Pel, MD;  Location: [facility];  Service: Orthopedics;  Laterality: Left;   TOTAL KNEE ARTHROPLASTY Left 08/22/2016   Procedure: TOTAL KNEE ARTHROPLASTY;  Surgeon: Meredith Pel, MD;  Location: [facility];  Service: Orthopedics;  Laterality: Left;   TUBAL LIGATION  1989    SOCIAL HISTORY: Social History   Socioeconomic History   Marital status: Divorced    Spouse name: Not on  file   Number of children: 3   Years of education: Not on file   Highest education level: Not on file  Occupational History   Occupation: UNEMPLOYED    Employer: DISABLED  Tobacco Use   Smoking status: Former    Packs/day: 0.50    Years: 15.00    Total pack years: 7.50    Types: Cigarettes    Quit date: 04/24/1985    Years since quitting: 36.8   Smokeless tobacco: Never  Vaping Use   Vaping Use: Never used  Substance and Sexual Activity   Alcohol use: No    Alcohol/week: 0.0 standard drinks of alcohol   Drug use: Not Currently    Types: Marijuana   Sexual activity: Not Currently  Birth control/protection: Surgical    Comment: hyst  Other Topics Concern   Not on file  Social History Narrative   Lives in [place] by herself.  Disabled.  Caffeine 1 cup avg daily.  3 kids, 6 grandkids.     Water aerobics 3 times a week.   Social Determinants of Health   Financial Resource Strain: Medium Risk (11/02/2021)   Overall Financial Resource Strain (CARDIA)    Difficulty of Paying Living Expenses: Somewhat hard  Food Insecurity: Unknown (11/02/2021)   Hunger Vital Sign    Worried About Running Out of Food in the Last Year: Not on file    Ran Out of Food in the Last Year: Never true  Transportation Needs: No Transportation Needs (11/02/2021)   PRAPARE - Hydrologist (Medical): No    Lack of Transportation (Non-Medical): No  Physical Activity: Not on file  Stress: Not on file  Social Connections: Not on file  Intimate Partner Violence: Not on file    FAMILY HISTORY: Family History  Problem Relation Age of Onset   Emphysema Mother    Arthritis Mother    Heart failure Mother        alive @ 71   Stroke Mother    Diabetes Mother    Hypertension Mother    Hyperlipidemia Mother    Depression Mother    Anxiety disorder Mother    Heart disease Mother    Alcoholism Mother    Depression Father    Heart disease Father        died @ 105's.   Stroke Father     Diabetes Father    Hyperlipidemia Father    Hypertension Father    Bipolar disorder Father    Sleep apnea Father    Alcoholism Father    Drug abuse Father    Sudden death Father    Diabetes Sister    Asthma Brother    Breast cancer Maternal Aunt 42 - 49   Colon cancer Maternal Grandfather        dx after 66   Heart disease Paternal Grandfather    Breast cancer Cousin        dx 36s   Esophageal cancer Neg Hx    Stomach cancer Neg Hx    Rectal cancer Neg Hx     ALLERGIES:  is allergic to hydrocodone-acetaminophen, penicillins, shellfish allergy, latex, and sulfa antibiotics.  MEDICATIONS:  Current Outpatient Medications  Medication Sig Dispense Refill   ACCU-CHEK GUIDE test strip      albuterol (PROVENTIL HFA;VENTOLIN HFA) 108 (90 Base) MCG/ACT inhaler Inhale 2 puffs into the lungs every 6 (six) hours as needed for wheezing or shortness of breath.     aspirin 81 MG chewable tablet Chew 81 mg by mouth daily.     atorvastatin (LIPITOR) 40 MG tablet Take 40 mg by mouth at bedtime.      busPIRone (BUSPAR) 30 MG tablet Take 30 mg by mouth 2 (two) times daily.     carvedilol (COREG) 6.25 MG tablet Take 1 tablet (6.25 mg total) by mouth 2 (two) times daily. 180 tablet 3   dexamethasone (DECADRON) 4 MG tablet Take 2 tabs by mouth 2 times daily starting day before chemo. Then take 2 tabs daily for 2 days starting day after chemo. Take with food. 30 tablet 1   diclofenac (VOLTAREN) 75 MG EC tablet Take 75 mg by mouth 2 (two) times daily.     diclofenac Sodium (VOLTAREN)  1 % GEL Apply 2 g topically 4 (four) times daily as needed (joint/muscle pain).     doxycycline (VIBRA-TABS) 100 MG tablet Take 1 tablet (100 mg total) by mouth 2 (two) times daily for 7 days. 14 tablet 0   EPINEPHrine 0.3 mg/0.3 mL IJ SOAJ injection Inject 0.3 mg into the muscle as needed for anaphylaxis. Follow package instructions as needed for severe allergy or anaphylactic reaction. 2 each 3   furosemide (LASIX) 40 MG  tablet Take 60 mg by mouth daily.     hydrOXYzine (VISTARIL) 25 MG capsule Take 25 mg by mouth 3 (three) times daily as needed for itching.     isosorbide dinitrate (ISORDIL) 20 MG tablet Take 20 mg by mouth daily.     lidocaine (LIDODERM) 5 % Place 1 patch onto the skin daily as needed for pain.     lidocaine-prilocaine (EMLA) cream Apply to affected area once 30 g 3   lisinopril (PRINIVIL,ZESTRIL) 20 MG tablet Take 20 mg by mouth daily.      loperamide (IMODIUM) 2 MG capsule Take 1 capsule (2 mg total) by mouth 4 (four) times daily as needed for diarrhea or loose stools. 12 capsule 0   metFORMIN (GLUCOPHAGE) 500 MG tablet Take 1 tablet (500 mg total) by mouth daily with breakfast. 90 tablet 0   MYRBETRIQ 25 MG TB24 tablet Take 1 tablet (25 mg total) by mouth daily. 30 tablet 0   nystatin (MYCOSTATIN/NYSTOP) powder Apply 1 application topically 3 (three) times daily as needed for rash.     ondansetron (ZOFRAN) 8 MG tablet Take 1 tablet (8 mg total) by mouth every 8 (eight) hours as needed for nausea or vomiting. Start on the third day after chemotherapy. 30 tablet 1   ondansetron (ZOFRAN-ODT) 4 MG disintegrating tablet Take 1 tablet (4 mg total) by mouth every 8 (eight) hours as needed for nausea or vomiting. 20 tablet 0   oxyCODONE (OXY IR/ROXICODONE) 5 MG immediate release tablet Take 1 tablet (5 mg total) by mouth every 6 (six) hours as needed for severe pain. 20 tablet 0   pantoprazole (PROTONIX) 40 MG tablet Take 1 tablet (40 mg total) by mouth 2 (two) times daily. 60 tablet 0   potassium chloride (KLOR-CON M10) 10 MEQ tablet Take 1 tablet (10 mEq total) by mouth 2 (two) times daily. 60 tablet 1   prochlorperazine (COMPAZINE) 10 MG tablet Take 1 tablet (10 mg total) by mouth every 6 (six) hours as needed for nausea or vomiting. 30 tablet 1   Semaglutide,0.25 or 0.5MG/DOS, (OZEMPIC, 0.25 OR 0.5 MG/DOSE,) 2 MG/1.5ML SOPN Inject 0.5 mg into the skin once a week. 3 mL 0   sertraline (ZOLOFT) 25 MG  tablet Take 1 tablet (25 mg total) by mouth daily. 90 tablet 1   valACYclovir (VALTREX) 1000 MG tablet Take 1 tablet (1,000 mg total) by mouth 2 (two) times daily. 14 tablet 0   Vitamin D, Ergocalciferol, (DRISDOL) 1.25 MG (50000 UNIT) CAPS capsule Take 1 capsule (50,000 Units total) by mouth every 7 (seven) days. 4 capsule 0   No current facility-administered medications for this visit.   Facility-Administered Medications Ordered in Other Visits  Medication Dose Route Frequency Provider Last Rate Last Admin   cyclophosphamide (CYTOXAN) 1,000 mg in sodium chloride 0.9 % 250 mL chemo infusion  520 mg/m2 (Treatment Plan Recorded) Intravenous Once Iruku, Arletha Pili, MD       DOCEtaxel (TAXOTERE) 120 mg in sodium chloride 0.9 % 250 mL chemo infusion  60 mg/m2 (Treatment Plan Recorded) Intravenous Once Iruku, Arletha Pili, MD        PHYSICAL EXAMINATION: ECOG PERFORMANCE STATUS: 1 - Symptomatic but completely ambulatory  Vitals:   03/03/22 1305  BP: 123/69  Pulse: 78  Resp: 16  Temp: 97.7 F (36.5 C)  SpO2: 100%    Filed Weights   03/03/22 1305  Weight: 215 lb (97.5 kg)   Physical Exam Constitutional:      Appearance: Normal appearance.  Cardiovascular:     Rate and Rhythm: Normal rate and regular rhythm.  Pulmonary:     Effort: Pulmonary effort is normal.     Breath sounds: Normal breath sounds.  Chest:     Comments: Right breast appears to have healed well.  [place] Abdominal:     General: Abdomen is flat.     Palpations: Abdomen is soft.     Comments: She has large abdominal pannus hence it is not easy to appreciate groin lymphadenopathy but no overt massive inguinal lymphadenopathy on exam today  Musculoskeletal:        General: Swelling (Bilateral symmetrical lower extremity swelling noted) present.     Cervical back: Normal range of motion and neck supple. No rigidity.  Lymphadenopathy:     Cervical: No cervical adenopathy.  Skin:    General: Skin is warm and dry.   Neurological:     General: No focal deficit present.     Mental Status: She is alert.      LABORATORY DATA:  I have reviewed the data as listed Lab Results  Component Value Date   WBC 11.0 (H) 03/03/2022   HGB 10.5 (L) 03/03/2022   HCT 32.2 (L) 03/03/2022   MCV 92.8 03/03/2022   PLT 226 03/03/2022   Lab Results  Component Value Date   NA 144 03/03/2022   K 3.6 03/03/2022   CL 108 03/03/2022   CO2 27 03/03/2022    RADIOGRAPHIC STUDIES: I have personally reviewed the radiological reports and agreed with the findings in the report.  ASSESSMENT AND PLAN:   Malignant neoplasm of upper-outer quadrant of female breast [facility]) This is a very pleasant 61 year old female patient with past medical history significant for congestive heart failure, coronary artery disease, diabetes, hypertension, dyslipidemia history of pulmonary embolism back in 2008 for which she needed anticoagulation, rheumatoid arthritis, obstructive sleep apnea who is newly diagnosed with right breast IDC noticed on screening mammogram.  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  She is now on adjuvant docetaxel cyclophosphamide, status post cycle 3.  Since last visit she had bilateral lower extremity swelling, went to the ER, had vascular ultrasound of the right leg, no DVT but was noted to have some groin lymphadenopathy.  She is very worried if her breast cancer has metastasized since she was found to have this groin lymphadenopathy. With regards to the lower extremity swelling, have asked her to increase the Lasix to 60 mg daily with repeat BMP on Monday.  She was very worried about the kidney function worsening with Lasix hence I have recommended repeating the lab.  She has already seen her cardiologist according to her who did not think her lower extremity swelling is related to cardiac causes.  We have discussed that this could very well be related to  chemotherapy.  There appears to be no evidence of DVT in the right leg.  I have also ordered a vascular ultrasound of the left lower extremity.  She does not want to skip treatment today at any cost.  Since she does not have any chest pain, cough, shortness of breath or other concerns for DVT/PE and since she has symmetrical lower extremity swelling, low concern for PE hence we have agreed to do the evaluation for DVT on Monday.  She is however always encouraged to go to the hospital with any worsening concerns. With regards to the groin lymphadenopathy that she is worried about metastatic disease, I have clearly explained to her that in the absence of sentinel lymph node involvement, it is highly unlikely that breast cancer would metastasize all the way to the groin.  This is a very uncommon presentation.  We have discussed about doing further imaging to evaluate this, CT abdomen pelvis with contrast was ordered.  She once again understands the risk of renal injury with contrast exposure. She will return to clinic in 1 week or sooner as needed.   Total time spent: 40 minutes including history, physical exam, review of records, counseling and coordination of care All questions were answered. The patient knows to call the clinic with any problems, questions or concerns.    [name] Pike, MD 03/03/22

## 2022-03-03 NOTE — Assessment & Plan Note (Signed)
This is a very pleasant 61 year old female patient with past medical history significant for congestive heart failure, coronary artery disease, diabetes, hypertension, dyslipidemia history of pulmonary embolism back in 2008 for which she needed anticoagulation, rheumatoid arthritis, obstructive sleep apnea who is newly diagnosed with right breast IDC noticed on screening mammogram.  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  She is now on adjuvant docetaxel cyclophosphamide, status post cycle 3.  Since last visit she had bilateral lower extremity swelling, went to the ER, had vascular ultrasound of the right leg, no DVT but was noted to have some groin lymphadenopathy.  She is very worried if her breast cancer has metastasized since she was found to have this groin lymphadenopathy. With regards to the lower extremity swelling, have asked her to increase the Lasix to 60 mg daily with repeat BMP on Monday.  She was very worried about the kidney function worsening with Lasix hence I have recommended repeating the lab.  She has already seen her cardiologist according to her who did not think her lower extremity swelling is related to cardiac causes.  We have discussed that this could very well be related to chemotherapy.  There appears to be no evidence of DVT in the right leg.  I have also ordered a vascular ultrasound of the left lower extremity.  She does not want to skip treatment today at any cost.  Since she does not have any chest pain, cough, shortness of breath or other concerns for DVT/PE and since she has symmetrical lower extremity swelling, low concern for PE hence we have agreed to do the evaluation for DVT on Monday.  She is however always encouraged to go to the hospital with any worsening concerns. With regards to the groin lymphadenopathy that she is worried about metastatic disease, I have clearly explained to her that in the absence  of sentinel lymph node involvement, it is highly unlikely that breast cancer would metastasize all the way to the groin.  This is a very uncommon presentation.  We have discussed about doing further imaging to evaluate this, CT abdomen pelvis with contrast was ordered.  She once again understands the risk of renal injury with contrast exposure. She will return to clinic in 1 week or sooner as needed.

## 2022-03-06 ENCOUNTER — Encounter: Payer: Self-pay | Admitting: *Deleted

## 2022-03-06 ENCOUNTER — Inpatient Hospital Stay: Payer: Medicare Other

## 2022-03-06 ENCOUNTER — Ambulatory Visit (HOSPITAL_COMMUNITY)
Admission: RE | Admit: 2022-03-06 | Discharge: 2022-03-06 | Disposition: A | Discharge status: Home / Self Care | Payer: Medicare Other | Source: Ambulatory Visit | Attending: Hematology and Oncology | Admitting: Hematology and Oncology

## 2022-03-06 VITALS — BP 126/55 | HR 72 | Temp 98.1°F

## 2022-03-06 DIAGNOSIS — Z171 Estrogen receptor negative status [ER-]: Secondary | ICD-10-CM

## 2022-03-06 DIAGNOSIS — M7989 Other specified soft tissue disorders: Secondary | ICD-10-CM | POA: Diagnosis present

## 2022-03-06 DIAGNOSIS — Z5111 Encounter for antineoplastic chemotherapy: Secondary | ICD-10-CM | POA: Diagnosis not present

## 2022-03-06 MED ORDER — PEGFILGRASTIM-CBQV 6 MG/0.6ML ~~LOC~~ SOSY
6.0000 mg | PREFILLED_SYRINGE | Freq: Once | SUBCUTANEOUS | Status: AC
  Administered 2022-03-06: 6 mg via SUBCUTANEOUS
  Filled 2022-03-06: qty 0.6

## 2022-03-06 NOTE — Progress Notes (Signed)
Left lower extremity venous duplex has been completed. Preliminary results can be found in CV Proc through chart review.  Results were given to Val at Dr. Rob Hickman office.  03/06/22 10:15 AM [name] RVT

## 2022-03-09 ENCOUNTER — Encounter: Payer: Self-pay | Admitting: *Deleted

## 2022-03-13 ENCOUNTER — Encounter (HOSPITAL_COMMUNITY): Payer: Self-pay

## 2022-03-13 ENCOUNTER — Ambulatory Visit (HOSPITAL_COMMUNITY)
Admission: RE | Admit: 2022-03-13 | Discharge: 2022-03-13 | Disposition: A | Discharge status: Home / Self Care | Payer: Medicare Other | Source: Ambulatory Visit | Attending: Hematology and Oncology | Admitting: Hematology and Oncology

## 2022-03-13 DIAGNOSIS — C50411 Malignant neoplasm of upper-outer quadrant of right female breast: Secondary | ICD-10-CM | POA: Diagnosis present

## 2022-03-13 DIAGNOSIS — R599 Enlarged lymph nodes, unspecified: Secondary | ICD-10-CM | POA: Insufficient documentation

## 2022-03-13 DIAGNOSIS — Z171 Estrogen receptor negative status [ER-]: Secondary | ICD-10-CM | POA: Insufficient documentation

## 2022-03-13 MED ORDER — SODIUM CHLORIDE (PF) 0.9 % IJ SOLN
INTRAMUSCULAR | Status: AC
  Filled 2022-03-13: qty 50

## 2022-03-13 MED ORDER — IOHEXOL 300 MG/ML  SOLN
100.0000 mL | Freq: Once | INTRAMUSCULAR | Status: AC | PRN
  Administered 2022-03-13: 100 mL via INTRAVENOUS

## 2022-03-20 ENCOUNTER — Ambulatory Visit (INDEPENDENT_AMBULATORY_CARE_PROVIDER_SITE_OTHER): Payer: Medicare Other | Admitting: Adult Health

## 2022-03-20 ENCOUNTER — Encounter: Payer: Self-pay | Admitting: Adult Health

## 2022-03-20 ENCOUNTER — Telehealth: Payer: Self-pay | Admitting: *Deleted

## 2022-03-20 VITALS — BP 120/80 | HR 83 | Ht 59.0 in | Wt 221.0 lb

## 2022-03-20 DIAGNOSIS — G4733 Obstructive sleep apnea (adult) (pediatric): Secondary | ICD-10-CM | POA: Diagnosis not present

## 2022-03-20 NOTE — Telephone Encounter (Signed)
-----   Message from [name] Pike, MD sent at 03/20/2022 12:48 PM EST ----- Can you please call her with the results, no concerns, the lymph nodes in her groin are likely reactive.

## 2022-03-20 NOTE — Patient Instructions (Signed)
Your Plan:  Continue CPAP Increase humidification  If your symptoms worsen or you develop new symptoms please let us know.    Thank you for coming to see [name] at [facility]. I hope we have been able to provide you high quality care today.  You may receive a patient satisfaction survey over the next few weeks. We would appreciate your feedback and comments so that we may continue to improve ourselves and the health of our patients.

## 2022-03-20 NOTE — Progress Notes (Signed)
PATIENT: [name] DOB: 1960/10/26  REASON FOR VISIT: follow up HISTORY FROM: patient PRIMARY NEUROLOGIST: Dr.  Laurel Dimmer Complaint  Patient presents with   Room 18    Room 18. Pt is here for her Follow Up. Pt states she's being having Nose Bleeds. Pt states she is sleeping better. Pts states she has some headaches.      HISTORY OF PRESENT ILLNESS: Today 03/20/22:  [name] is a 61 year old female with a history of obstructive sleep apnea on CPAP.  She returns today for follow-up.  She reports that the CPAP is working well for her.  She does state that 3 weeks ago started having nose bleeds every morning. PCP told her it was her CPAP but she didn't use the CPAP for two nights but still woke up with blood in her nose.  She states that she never has blood running from her nose but does notice blood when she blows her nose in the mornings.      REVIEW OF SYSTEMS: Out of a complete 14 system review of symptoms, the patient complains only of the following symptoms, and all other reviewed systems are negative.   ALLERGIES: Allergies  Allergen Reactions   Hydrocodone-Acetaminophen Itching and Other (See Comments)    confusion   Penicillins Swelling and Other (See Comments)    Has patient had a PCN reaction causing immediate rash, facial/tongue/throat swelling, SOB or lightheadedness with hypotension: YES Has patient had a PCN reaction causing severe rash involving mucus membranes or skin necrosis: NO Has patient had a PCN reaction that required hospitalization NO Has patient had a PCN reaction occurring within the last 10 years: NO If all of the above answers are "NO", then may proceed with Cephalosporin use.   Shellfish Allergy Hives   Latex Rash   Sulfa Antibiotics Diarrhea, Itching and Rash    HOME MEDICATIONS: Outpatient Medications Prior to Visit  Medication Sig Dispense Refill   ACCU-CHEK GUIDE test strip      albuterol (PROVENTIL HFA;VENTOLIN HFA) 108  (90 Base) MCG/ACT inhaler Inhale 2 puffs into the lungs every 6 (six) hours as needed for wheezing or shortness of breath.     aspirin 81 MG chewable tablet Chew 81 mg by mouth daily.     atorvastatin (LIPITOR) 40 MG tablet Take 40 mg by mouth at bedtime.      busPIRone (BUSPAR) 30 MG tablet Take 30 mg by mouth 2 (two) times daily.     carvedilol (COREG) 6.25 MG tablet Take 1 tablet (6.25 mg total) by mouth 2 (two) times daily. 180 tablet 3   diclofenac Sodium (VOLTAREN) 1 % GEL Apply 2 g topically 4 (four) times daily as needed (joint/muscle pain).     EPINEPHrine 0.3 mg/0.3 mL IJ SOAJ injection Inject 0.3 mg into the muscle as needed for anaphylaxis. Follow package instructions as needed for severe allergy or anaphylactic reaction. 2 each 3   furosemide (LASIX) 40 MG tablet Take 60 mg by mouth daily.     hydrOXYzine (VISTARIL) 25 MG capsule Take 25 mg by mouth 3 (three) times daily as needed for itching.     isosorbide dinitrate (ISORDIL) 20 MG tablet Take 20 mg by mouth daily.     lidocaine (LIDODERM) 5 % Place 1 patch onto the skin daily as needed for pain.     lisinopril (PRINIVIL,ZESTRIL) 20 MG tablet Take 20 mg by mouth daily.      loperamide (IMODIUM) 2 MG capsule Take 1 capsule (2  mg total) by mouth 4 (four) times daily as needed for diarrhea or loose stools. 12 capsule 0   metFORMIN (GLUCOPHAGE) 500 MG tablet Take 1 tablet (500 mg total) by mouth daily with breakfast. 90 tablet 0   nystatin (MYCOSTATIN/NYSTOP) powder Apply 1 application topically 3 (three) times daily as needed for rash.     ondansetron (ZOFRAN) 8 MG tablet Take 1 tablet (8 mg total) by mouth every 8 (eight) hours as needed for nausea or vomiting. Start on the third day after chemotherapy. 30 tablet 1   oxyCODONE (OXY IR/ROXICODONE) 5 MG immediate release tablet Take 1 tablet (5 mg total) by mouth every 6 (six) hours as needed for severe pain. 20 tablet 0   pantoprazole (PROTONIX) 40 MG tablet Take 1 tablet (40 mg total)  by mouth 2 (two) times daily. (Patient taking differently: Take 40 mg by mouth daily.) 60 tablet 0   potassium chloride (KLOR-CON M10) 10 MEQ tablet Take 1 tablet (10 mEq total) by mouth 2 (two) times daily. 60 tablet 1   prochlorperazine (COMPAZINE) 10 MG tablet Take 1 tablet (10 mg total) by mouth every 6 (six) hours as needed for nausea or vomiting. 30 tablet 1   sertraline (ZOLOFT) 25 MG tablet Take 1 tablet (25 mg total) by mouth daily. 90 tablet 1   Vitamin D, Ergocalciferol, (DRISDOL) 1.25 MG (50000 UNIT) CAPS capsule Take 1 capsule (50,000 Units total) by mouth every 7 (seven) days. 4 capsule 0   dexamethasone (DECADRON) 4 MG tablet Take 2 tabs by mouth 2 times daily starting day before chemo. Then take 2 tabs daily for 2 days starting day after chemo. Take with food. (Patient not taking: Reported on 03/20/2022) 30 tablet 1   diclofenac (VOLTAREN) 75 MG EC tablet Take 75 mg by mouth 2 (two) times daily.     lidocaine-prilocaine (EMLA) cream Apply to affected area once (Patient not taking: Reported on 03/20/2022) 30 g 3   MYRBETRIQ 25 MG TB24 tablet Take 1 tablet (25 mg total) by mouth daily. (Patient not taking: Reported on 03/20/2022) 30 tablet 0   ondansetron (ZOFRAN-ODT) 4 MG disintegrating tablet Take 1 tablet (4 mg total) by mouth every 8 (eight) hours as needed for nausea or vomiting. (Patient not taking: Reported on 03/20/2022) 20 tablet 0   Semaglutide,0.25 or 0.'5MG'$ /DOS, (OZEMPIC, 0.25 OR 0.5 MG/DOSE,) 2 MG/1.5ML SOPN Inject 0.5 mg into the skin once a week. (Patient not taking: Reported on 03/20/2022) 3 mL 0   valACYclovir (VALTREX) 1000 MG tablet Take 1 tablet (1,000 mg total) by mouth 2 (two) times daily. (Patient not taking: Reported on 03/20/2022) 14 tablet 0   No facility-administered medications prior to visit.    PAST MEDICAL HISTORY: Past Medical History:  Diagnosis Date   Allergy    Anginal pain ([place])    a. NL cath in 2008;  b. Myoview 03/2011: dec uptake along mid  anterior wall on stress imaging -> ? attenuation vs. ischemia, EF 65%;  c. 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 2008 -> coumadin x 6 mos.   Rheumatoid arthritis ([place])    Sleep apnea    on CPAP 02/2018   SOB (shortness of breath)    Thyroid disease    TIA (transient ischemic attack)    Urinary urgency    Vitamin D deficiency     PAST SURGICAL HISTORY: Past Surgical History:  Procedure Laterality Date   ABDOMINAL HYSTERECTOMY  2005   APPENDECTOMY     BREAST LUMPECTOMY WITH RADIOACTIVE SEED AND SENTINEL LYMPH NODE BIOPSY Right 11/16/2021   Procedure: RIGHT BREAST RADIOACTIVE SEED LOCALIZED LUMPECTOMY WITH RIGHT AXILLARY SENTINEL LYMPH NODE BIOPSY;  Surgeon: Donnie Mesa, MD;  Location: [facility];  Service: General;  Laterality: Right;  LMA & PEC BLOCK   CARDIAC CATHETERIZATION     Normal   CARDIAC CATHETERIZATION N/A 09/30/2014   Procedure: Left Heart Cath and Coronary Angiography;  Surgeon: Sherren Mocha, MD;  Location: [facility];  Service: Cardiovascular;  Laterality: N/A;   LAPAROSCOPIC APPENDECTOMY N/A 06/03/2012   Procedure: APPENDECTOMY LAPAROSCOPIC;  Surgeon: Stark Klein, MD;  Location: [facility];  Service: General;  Laterality: N/A;   Left knee surgery  2008   LEG  SURGERY     TONSILLECTOMY     TOTAL HIP ARTHROPLASTY Left 06/11/2018   Procedure: LEFT TOTAL HIP ARTHROPLASTY ANTERIOR APPROACH;  Surgeon: Meredith Pel, MD;  Location: [facility];  Service: Orthopedics;  Laterality: Left;   TOTAL KNEE ARTHROPLASTY Left 08/22/2016   Procedure: TOTAL KNEE ARTHROPLASTY;  Surgeon: Meredith Pel, MD;  Location: [facility];  Service: Orthopedics;  Laterality: Left;   TUBAL LIGATION  1989    FAMILY HISTORY: Family History  Problem Relation Age of Onset   Emphysema Mother    Arthritis Mother    Heart failure Mother        alive @ 32   Stroke Mother    Diabetes Mother    Hypertension Mother    Hyperlipidemia Mother    Depression Mother    Anxiety disorder Mother    Heart disease Mother    Alcoholism Mother    Depression Father    Heart disease Father        died @ 71's.   Stroke Father    Diabetes Father    Hyperlipidemia Father    Hypertension Father    Bipolar disorder Father    Sleep apnea Father    Alcoholism Father    Drug abuse Father    Sudden death Father    Diabetes Sister    Asthma Brother    Breast cancer Maternal Aunt 36 - 52   Colon cancer Maternal Grandfather        dx after 32   Heart disease Paternal Grandfather    Breast cancer Cousin        dx 18s   Esophageal cancer Neg Hx    Stomach cancer Neg Hx  Rectal cancer Neg Hx     SOCIAL HISTORY: Social History   Socioeconomic History   Marital status: Divorced    Spouse name: Not on file   Number of children: 3   Years of education: Not on file   Highest education level: Not on file  Occupational History   Occupation: UNEMPLOYED    Employer: DISABLED  Tobacco Use   Smoking status: Former    Packs/day: 0.50    Years: 15.00    Total pack years: 7.50    Types: Cigarettes    Quit date: 04/24/1985    Years since quitting: 36.9   Smokeless tobacco: Never  Vaping Use   Vaping Use: Never used  Substance and Sexual Activity   Alcohol use: No    Alcohol/week: 0.0  standard drinks of alcohol   Drug use: Not Currently    Types: Marijuana   Sexual activity: Not Currently    Birth control/protection: Surgical    Comment: hyst  Other Topics Concern   Not on file  Social History Narrative   Lives in [place] by herself.  Disabled.  Occasional Tea .  3 kids, 6 grandkids.     Water aerobics 3 times a week.   Social Determinants of Health   Financial Resource Strain: Medium Risk (11/02/2021)   Overall Financial Resource Strain (CARDIA)    Difficulty of Paying Living Expenses: Somewhat hard  Food Insecurity: Unknown (11/02/2021)   Hunger Vital Sign    Worried About Running Out of Food in the Last Year: Not on file    Ran Out of Food in the Last Year: Never true  Transportation Needs: No Transportation Needs (11/02/2021)   PRAPARE - Hydrologist (Medical): No    Lack of Transportation (Non-Medical): No  Physical Activity: Not on file  Stress: Not on file  Social Connections: Not on file  Intimate Partner Violence: Not on file      PHYSICAL EXAM  Vitals:   03/20/22 1410  BP: 120/80  Pulse: 83  Weight: 221 lb (100.2 kg)  Height: '4\' 11"'$  (1.499 m)   Body mass index is 44.64 kg/m.  Generalized: Well developed, in no acute distress  Chest: Lungs clear to auscultation bilaterally  Neurological examination  Mentation: Alert oriented to time, place, history taking. Follows all commands speech and language fluent Cranial nerve II-XII: Extraocular movements were full, visual field were full on confrontational test Head turning and shoulder shrug  were normal and symmetric. Motor: The motor testing reveals 5 over 5 strength of all 4 extremities. Good symmetric motor tone is noted throughout.  Sensory: Sensory testing is intact to soft touch on all 4 extremities. No evidence of extinction is noted.  Gait and station: Gait is normal.    DIAGNOSTIC DATA (LABS, IMAGING, TESTING) - I reviewed patient records, labs, notes,  testing and imaging myself where available.  Lab Results  Component Value Date   WBC 11.0 (H) 03/03/2022   HGB 10.5 (L) 03/03/2022   HCT 32.2 (L) 03/03/2022   MCV 92.8 03/03/2022   PLT 226 03/03/2022      Component Value Date/Time   NA 144 03/03/2022 1250   NA 144 09/28/2021 0951   K 3.6 03/03/2022 1250   CL 108 03/03/2022 1250   CO2 27 03/03/2022 1250   GLUCOSE 130 (H) 03/03/2022 1250   BUN 18 03/03/2022 1250   BUN 15 09/28/2021 0951   CREATININE 0.85 03/03/2022 1250   CALCIUM 9.6 03/03/2022 1250  PROT 6.8 03/03/2022 1250   PROT 6.6 09/28/2021 0951   ALBUMIN 3.9 03/03/2022 1250   ALBUMIN 4.3 09/28/2021 0951   AST 14 (L) 03/03/2022 1250   ALT 18 03/03/2022 1250   ALKPHOS 61 03/03/2022 1250   BILITOT 0.5 03/03/2022 1250   GFRNONAA >60 03/03/2022 1250   GFRAA 76 02/03/2020 0845   Lab Results  Component Value Date   CHOL 127 09/28/2021   HDL 48 09/28/2021   LDLCALC 60 09/28/2021   TRIG 99 09/28/2021   CHOLHDL 4.2 04/23/2011   CHOLHDL 4.3 04/23/2011   Lab Results  Component Value Date   HGBA1C 6.4 (H) 09/28/2021   Lab Results  Component Value Date   WYOVZCHY85 027 02/03/2020   Lab Results  Component Value Date   TSH 0.506 04/23/2021      ASSESSMENT AND PLAN 61 y.o. year old female  has a past medical history of Allergy, Anginal pain ([place]), Anxiety, Arthritis, Asthma, Back pain, Bone cancer ([place]), Cancer ([place]), Chest pain, CHF (congestive heart failure) ([place]), Clotting disorder ([place]), Constipation, Depression, Diabetes mellitus, Drug use, Dyspnea, Frequent urination, GERD (gastroesophageal reflux disease), Glaucoma, History of stomach ulcers, HLD (hyperlipidemia), Hypertension, IBS (irritable bowel syndrome), Joint pain, Joint pain, Lactose intolerance, Leg edema, Mediastinal mass, Neuromuscular disorder ([place]), Obesity, Palpitations, Pneumonia (05/2016), Pulmonary edema, Pulmonary embolism (HCC), Rheumatoid arthritis ([place]), Sleep apnea, SOB (shortness of breath),  Thyroid disease, TIA (transient ischemic attack), Urinary urgency, and Vitamin D deficiency. here with:  OSA on CPAP  - CPAP compliance excellent - Good treatment of AHI  - Encourage patient to use CPAP nightly and > 4 hours each night -Adjust as needed humidity on her machine to 6.  Showed the patient how to make adjustments on.  We will see if this helps with nosebleeds. - F/U in 6 months or sooner if needed     Ward Givens, MSN, NP-C 03/20/2022, 2:34 PM [facility] [address], [place], [place] [address] [phone]

## 2022-03-20 NOTE — Telephone Encounter (Signed)
Per Dr.Iruku, called pt with message below. Pt verbalized understanding

## 2022-03-20 NOTE — Progress Notes (Signed)
Radiation Oncology         [phone] ________________________________  Name: [name]        MRN: [id number]  Date of Service: 03/23/2022 DOB: 12-30-1960  RK:YHCWC, [name], FNP  [name] Pike, MD     REFERRING PHYSICIAN: [name] Pike, MD   DIAGNOSIS: The encounter diagnosis was Malignant neoplasm of upper-outer quadrant of right breast in female, estrogen receptor negative ([place]).   HISTORY OF PRESENT ILLNESS: [name] is a 61 y.o. female originally seen in the multidisciplinary breast clinic for a new diagnosis of right breast cancer. The patient was noted to have right breast mass seen on screening mammogram.  Diagnostic work-up including an ultrasound on 10/17/2021 showed a mass in the 1 o'clock position of the right breast measuring up to 8 mm.  Her axilla was negative for adenopathy.  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  Prognostic markers showed triple negative findings.    Since her last visit she underwent a right lumpectomy which showed a grade 3 invasive ductal carcinoma measuring 1.4 cm.  Her margins were negative and her single lymph node was also negative for disease.  She reviewed systemic chemotherapy for her triple negative disease on 12/30/2021, her last infusion was on 03/03/2022.  She is seen today to discuss adjuvant radiotherapy to the right breast.  PREVIOUS RADIATION THERAPY: No   PAST MEDICAL HISTORY:  Past Medical History:  Diagnosis Date   Allergy    Anginal pain ([place])    a. NL cath in 2008;  b. Myoview 03/2011: dec uptake along mid anterior wall on stress imaging -> ? attenuation vs. ischemia, EF 65%;  c. 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 2008 -> coumadin x 6 mos.   Rheumatoid arthritis ([place])    Sleep apnea    on CPAP 02/2018   SOB (shortness of breath)    Thyroid disease    TIA (transient ischemic attack)    Urinary urgency    Vitamin D deficiency        PAST SURGICAL HISTORY: Past Surgical History:  Procedure Laterality Date   ABDOMINAL HYSTERECTOMY  2005   APPENDECTOMY     BREAST LUMPECTOMY WITH RADIOACTIVE SEED AND SENTINEL LYMPH NODE BIOPSY Right 11/16/2021   Procedure: RIGHT BREAST RADIOACTIVE SEED LOCALIZED LUMPECTOMY WITH RIGHT AXILLARY SENTINEL LYMPH NODE BIOPSY;  Surgeon: Donnie Mesa, MD;  Location: [facility];  Service:  General;  Laterality: Right;  LMA & PEC BLOCK   CARDIAC CATHETERIZATION     Normal   CARDIAC CATHETERIZATION N/A 09/30/2014   Procedure: Left Heart Cath and Coronary Angiography;  Surgeon: Sherren Mocha, MD;  Location: [facility];  Service: Cardiovascular;  Laterality: N/A;   LAPAROSCOPIC APPENDECTOMY N/A 06/03/2012   Procedure: APPENDECTOMY LAPAROSCOPIC;  Surgeon: Stark Klein, MD;  Location: [place] [facility];  Service: General;  Laterality: N/A;   Left knee surgery  2008   LEG SURGERY     TONSILLECTOMY     TOTAL HIP ARTHROPLASTY Left 06/11/2018   Procedure: LEFT TOTAL HIP ARTHROPLASTY ANTERIOR APPROACH;  Surgeon: Meredith Pel, MD;  Location: [place];  Service: Orthopedics;  Laterality: Left;   TOTAL KNEE ARTHROPLASTY Left 08/22/2016   Procedure: TOTAL KNEE ARTHROPLASTY;  Surgeon: Meredith Pel, MD;  Location: [place];  Service:  Orthopedics;  Laterality: Left;   TUBAL LIGATION  1989     FAMILY HISTORY:  Family History  Problem Relation Age of Onset   Emphysema Mother    Arthritis Mother    Heart failure Mother        alive @ 37   Stroke Mother    Diabetes Mother    Hypertension Mother    Hyperlipidemia Mother    Depression Mother    Anxiety disorder Mother    Heart disease Mother    Alcoholism Mother    Depression Father    Heart disease Father        died @ 21's.   Stroke Father    Diabetes Father    Hyperlipidemia Father    Hypertension Father    Bipolar disorder Father    Sleep apnea Father    Alcoholism Father    Drug abuse Father    Sudden death Father    Diabetes Sister    Asthma Brother    Breast cancer Maternal Aunt 18 - 7   Colon cancer Maternal Grandfather        dx after 16   Heart disease Paternal Grandfather    Breast cancer Cousin        dx 25s   Esophageal cancer Neg Hx    Stomach cancer Neg Hx    Rectal cancer Neg Hx      SOCIAL HISTORY:  reports that she quit smoking about 36 years ago. Her smoking use included cigarettes. She has a 7.50 pack-year smoking history. She has never used smokeless tobacco. She reports that she does not currently use drugs after having used the following drugs: Marijuana. She reports that she does not drink alcohol.  The patient is divorced and lives in [place].  She is unemployed but is very active in her church and community with providing care and resources to people experiencing homelessness. She is accompanied by her daughter.    ALLERGIES: Hydrocodone-acetaminophen, Penicillins, Shellfish allergy, Latex, and Sulfa antibiotics   MEDICATIONS:  Current Outpatient Medications  Medication Sig Dispense Refill   ACCU-CHEK GUIDE test strip      albuterol (PROVENTIL HFA;VENTOLIN HFA) 108 (90 Base) MCG/ACT inhaler Inhale 2 puffs into the lungs every 6 (six) hours as needed for wheezing or shortness of breath.     aspirin 81 MG chewable  tablet Chew 81 mg by mouth daily.     atorvastatin (LIPITOR) 40 MG tablet Take 40 mg by mouth at bedtime.      busPIRone (BUSPAR) 30 MG tablet Take 30 mg by mouth 2 (two) times daily.  carvedilol (COREG) 6.25 MG tablet Take 1 tablet (6.25 mg total) by mouth 2 (two) times daily. 180 tablet 3   dexamethasone (DECADRON) 4 MG tablet Take 2 tabs by mouth 2 times daily starting day before chemo. Then take 2 tabs daily for 2 days starting day after chemo. Take with food. 30 tablet 1   diclofenac (VOLTAREN) 75 MG EC tablet Take 75 mg by mouth 2 (two) times daily.     diclofenac Sodium (VOLTAREN) 1 % GEL Apply 2 g topically 4 (four) times daily as needed (joint/muscle pain).     EPINEPHrine 0.3 mg/0.3 mL IJ SOAJ injection Inject 0.3 mg into the muscle as needed for anaphylaxis. Follow package instructions as needed for severe allergy or anaphylactic reaction. 2 each 3   furosemide (LASIX) 40 MG tablet Take 60 mg by mouth daily.     hydrOXYzine (VISTARIL) 25 MG capsule Take 25 mg by mouth 3 (three) times daily as needed for itching.     isosorbide dinitrate (ISORDIL) 20 MG tablet Take 20 mg by mouth daily.     lidocaine (LIDODERM) 5 % Place 1 patch onto the skin daily as needed for pain.     lidocaine-prilocaine (EMLA) cream Apply to affected area once 30 g 3   lisinopril (PRINIVIL,ZESTRIL) 20 MG tablet Take 20 mg by mouth daily.      loperamide (IMODIUM) 2 MG capsule Take 1 capsule (2 mg total) by mouth 4 (four) times daily as needed for diarrhea or loose stools. 12 capsule 0   metFORMIN (GLUCOPHAGE) 500 MG tablet Take 1 tablet (500 mg total) by mouth daily with breakfast. 90 tablet 0   MYRBETRIQ 25 MG TB24 tablet Take 1 tablet (25 mg total) by mouth daily. 30 tablet 0   nystatin (MYCOSTATIN/NYSTOP) powder Apply 1 application topically 3 (three) times daily as needed for rash.     ondansetron (ZOFRAN) 8 MG tablet Take 1 tablet (8 mg total) by mouth every 8 (eight) hours as needed for nausea or vomiting.  Start on the third day after chemotherapy. 30 tablet 1   ondansetron (ZOFRAN-ODT) 4 MG disintegrating tablet Take 1 tablet (4 mg total) by mouth every 8 (eight) hours as needed for nausea or vomiting. 20 tablet 0   oxyCODONE (OXY IR/ROXICODONE) 5 MG immediate release tablet Take 1 tablet (5 mg total) by mouth every 6 (six) hours as needed for severe pain. 20 tablet 0   pantoprazole (PROTONIX) 40 MG tablet Take 1 tablet (40 mg total) by mouth 2 (two) times daily. 60 tablet 0   potassium chloride (KLOR-CON M10) 10 MEQ tablet Take 1 tablet (10 mEq total) by mouth 2 (two) times daily. 60 tablet 1   prochlorperazine (COMPAZINE) 10 MG tablet Take 1 tablet (10 mg total) by mouth every 6 (six) hours as needed for nausea or vomiting. 30 tablet 1   Semaglutide,0.25 or 0.'5MG'$ /DOS, (OZEMPIC, 0.25 OR 0.5 MG/DOSE,) 2 MG/1.5ML SOPN Inject 0.5 mg into the skin once a week. 3 mL 0   sertraline (ZOLOFT) 25 MG tablet Take 1 tablet (25 mg total) by mouth daily. 90 tablet 1   valACYclovir (VALTREX) 1000 MG tablet Take 1 tablet (1,000 mg total) by mouth 2 (two) times daily. 14 tablet 0   Vitamin D, Ergocalciferol, (DRISDOL) 1.25 MG (50000 UNIT) CAPS capsule Take 1 capsule (50,000 Units total) by mouth every 7 (seven) days. 4 capsule 0   No current facility-administered medications for this visit.     REVIEW OF SYSTEMS: On review  of systems, the patient reports that she is doing ***     PHYSICAL EXAM:  Wt Readings from Last 3 Encounters:  03/03/22 215 lb (97.5 kg)  02/10/22 209 lb 8 oz (95 kg)  01/20/22 206 lb 4.8 oz (93.6 kg)   Temp Readings from Last 3 Encounters:  03/06/22 98.1 F (36.7 C) (Oral)  03/03/22 97.7 F (36.5 C) (Temporal)  02/26/22 98.5 F (36.9 C) (Oral)   BP Readings from Last 3 Encounters:  03/06/22 (!) 126/55  03/03/22 123/69  02/26/22 114/78   Pulse Readings from Last 3 Encounters:  03/06/22 72  03/03/22 78  02/26/22 77    In general this is a well appearing African-American  female in no acute distress. She's alert and oriented x4 and appropriate throughout the examination. Cardiopulmonary assessment is negative for acute distress and she exhibits normal effort.  Both her breast and axillary incision site are well-healed without erythema, separation, or drainage.    ECOG = ***  0 - Asymptomatic (Fully active, able to carry on all predisease activities without restriction)  1 - Symptomatic but completely ambulatory (Restricted in physically strenuous activity but ambulatory and able to carry out work of a light or sedentary nature. For example, light housework, office work)  2 - Symptomatic, <50% in bed during the day (Ambulatory and capable of all self care but unable to carry out any work activities. Up and about more than 50% of waking hours)  3 - Symptomatic, >50% in bed, but not bedbound (Capable of only limited self-care, confined to bed or chair 50% or more of waking hours)  4 - Bedbound (Completely disabled. Cannot carry on any self-care. Totally confined to bed or chair)  5 - Death   Eustace Pen MM, Creech RH, Tormey DC, et al. [phone]). "Toxicity and response criteria of the [employer]". [place] Oncol. 5 (6): 649-55    LABORATORY DATA:  Lab Results  Component Value Date   WBC 11.0 (H) 03/03/2022   HGB 10.5 (L) 03/03/2022   HCT 32.2 (L) 03/03/2022   MCV 92.8 03/03/2022   PLT 226 03/03/2022   Lab Results  Component Value Date   NA 144 03/03/2022   K 3.6 03/03/2022   CL 108 03/03/2022   CO2 27 03/03/2022   Lab Results  Component Value Date   ALT 18 03/03/2022   AST 14 (L) 03/03/2022   ALKPHOS 61 03/03/2022   BILITOT 0.5 03/03/2022      RADIOGRAPHY: CT Abdomen Pelvis W Contrast  Result Date: 03/15/2022 CLINICAL DATA:  Lymphadenopathy.  Personal history of breast cancer. EXAM: CT ABDOMEN AND PELVIS WITH CONTRAST TECHNIQUE: Multidetector CT imaging of the abdomen and pelvis was performed using the standard protocol  following bolus administration of intravenous contrast. RADIATION DOSE REDUCTION: This exam was performed according to the departmental dose-optimization program which includes automated exposure control, adjustment of the mA and/or kV according to patient size and/or use of iterative reconstruction technique. CONTRAST:  159m OMNIPAQUE IOHEXOL 300 MG/ML  SOLN COMPARISON:  CT of the abdomen and pelvis 11/29/2021 FINDINGS: Lower chest: The lung bases are clear without focal nodule, mass, or airspace disease. Heart size is normal. No significant pleural or pericardial effusion is present. Hepatobiliary: No focal liver abnormality is seen. No gallstones, gallbladder wall thickening, or biliary dilatation. Pancreas: Unremarkable. No pancreatic ductal dilatation or surrounding inflammatory changes. Spleen: Normal in size without focal abnormality. Adrenals/Urinary Tract: Adrenal glands are unremarkable. Kidneys are normal, without renal calculi, focal  lesion, or hydronephrosis. Bladder is unremarkable. Stomach/Bowel: Stomach is within normal limits. Appendix is surgically absent. No evidence of bowel wall thickening, distention, or inflammatory changes. Vascular/Lymphatic: No significant vascular findings are present. No enlarged abdominal or pelvic lymph nodes. Bilateral inguinal lymph nodes are slightly more prominent than on the prior study. The largest short axis node in the right groin is 12 mm. The largest short axis node in the left groin is 12 mm. Nodes are ovoid. No necrotic nodes are present. Reproductive: Status post hysterectomy. No adnexal masses. Other: No abdominal wall hernia or abnormality. No abdominopelvic ascites. Musculoskeletal: Left total hip arthroplasty noted. Grade 1 degenerative anterolisthesis at L4-5 is stable. No focal osseous lesions are present. Facet degenerative changes are worse left than right at L4-5 and L5-S1. IMPRESSION: 1. Bilateral inguinal lymph nodes are slightly more prominent  than on the prior study. The largest short axis node in the right groin is 12 mm. The largest short axis node in the left groin is 12 mm. No necrotic nodes are present. These are likely reactive. 2. No retroperitoneal or abdominal adenopathy to suggest metastatic disease. 3. Status post hysterectomy and appendectomy. 4. Left total hip arthroplasty. 5. Stable grade 1 degenerative anterolisthesis at L4-5 with associated facet degenerative changes. Electronically Signed   By: San Morelle M.D.   On: 03/15/2022 10:50   VAS [name] LOWER EXTREMITY VENOUS (DVT)  Result Date: 03/06/2022  Lower Venous DVT Study Patient Name:  DELL HURTUBISE  Date of Exam:   03/06/2022 Medical Rec #: [id number]            Accession #:    [id number] Date of Birth: [birth year]            Patient Gender: F Patient Age:   75 years Exam Location:  [facility] Procedure:      VAS [name] LOWER EXTREMITY VENOUS (DVT) Referring Phys: Arletha Pili IRUKU --------------------------------------------------------------------------------  Indications: Swelling.  Risk Factors: Cancer. Limitations: Poor ultrasound/tissue interface. Comparison Study: No prior studies. Performing Technologist: Oliver Hum RVT  Examination Guidelines: A complete evaluation includes B-mode imaging, spectral Doppler, color Doppler, and power Doppler as needed of all accessible portions of each vessel. Bilateral testing is considered an integral part of a complete examination. Limited examinations for reoccurring indications may be performed as noted. The reflux portion of the exam is performed with the patient in reverse Trendelenburg.  +-----+---------------+---------+-----------+----------+--------------+ RIGHTCompressibilityPhasicitySpontaneityPropertiesThrombus Aging +-----+---------------+---------+-----------+----------+--------------+ CFV  Full           Yes      Yes                                  +-----+---------------+---------+-----------+----------+--------------+   +---------+---------------+---------+-----------+----------+--------------+ LEFT     CompressibilityPhasicitySpontaneityPropertiesThrombus Aging +---------+---------------+---------+-----------+----------+--------------+ CFV      Full           Yes      Yes                                 +---------+---------------+---------+-----------+----------+--------------+ SFJ      Full                                                        +---------+---------------+---------+-----------+----------+--------------+  FV Prox  Full                                                        +---------+---------------+---------+-----------+----------+--------------+ FV Mid   Full                                                        +---------+---------------+---------+-----------+----------+--------------+ FV DistalFull                                                        +---------+---------------+---------+-----------+----------+--------------+ PFV      Full                                                        +---------+---------------+---------+-----------+----------+--------------+ POP      Full           Yes      Yes                                 +---------+---------------+---------+-----------+----------+--------------+ PTV      Full                                                        +---------+---------------+---------+-----------+----------+--------------+ PERO     Full                                                        +---------+---------------+---------+-----------+----------+--------------+     Summary: RIGHT: - No evidence of common femoral vein obstruction.  LEFT: - There is no evidence of deep vein thrombosis in the lower extremity. However, portions of this examination were limited- see technologist comments above.  - No cystic structure found in the popliteal  fossa.  *See table(s) above for measurements and observations. Electronically signed by Servando Snare MD on 03/06/2022 at 6:07:10 PM.    Final    VAS [name] LOWER EXTREMITY VENOUS (DVT) (ONLY [facility] & [facility])  Result Date: 02/27/2022  Lower Venous DVT Study Patient Name:  MABRY SANTARELLI  Date of Exam:   02/26/2022 Medical Rec #: [id number]            Accession #:    [id number] Date of Birth: 07/20/1960            Patient Gender: F Patient Age:   64 years Exam Location:  [facility] Procedure:      VAS [name] LOWER EXTREMITY VENOUS (DVT) Referring Phys: LESLIE SOFIA --------------------------------------------------------------------------------  Indications: Pain, and Swelling.  Risk Factors: Chemotherapy. Limitations: Poor ultrasound/tissue interface. Comparison Study: Previous exams on 03/07/21, 05/19/20, 08/18/19, and 08/14/17 all                   negative for DVT Performing Technologist: Rogelia Rohrer RVT, RDMS  Examination Guidelines: A complete evaluation includes B-mode imaging, spectral Doppler, color Doppler, and power Doppler as needed of all accessible portions of each vessel. Bilateral testing is considered an integral part of a complete examination. Limited examinations for reoccurring indications may be performed as noted. The reflux portion of the exam is performed with the patient in reverse Trendelenburg.  +---------+---------------+---------+-----------+----------+--------------+ RIGHT    CompressibilityPhasicitySpontaneityPropertiesThrombus Aging +---------+---------------+---------+-----------+----------+--------------+ CFV      Full           Yes      Yes                                 +---------+---------------+---------+-----------+----------+--------------+ SFJ      Full                                                        +---------+---------------+---------+-----------+----------+--------------+ FV Prox  Full           Yes      Yes                                  +---------+---------------+---------+-----------+----------+--------------+ FV Mid   Full           Yes      Yes                                 +---------+---------------+---------+-----------+----------+--------------+ FV DistalFull           Yes      Yes                                 +---------+---------------+---------+-----------+----------+--------------+ PFV      Full                                                        +---------+---------------+---------+-----------+----------+--------------+ POP      Full                                                        +---------+---------------+---------+-----------+----------+--------------+ PTV      Full                                                        +---------+---------------+---------+-----------+----------+--------------+ PERO     Full                                                        +---------+---------------+---------+-----------+----------+--------------+   +----+---------------+---------+-----------+----------+--------------+  LEFTCompressibilityPhasicitySpontaneityPropertiesThrombus Aging +----+---------------+---------+-----------+----------+--------------+ CFV Full           Yes      Yes                                 +----+---------------+---------+-----------+----------+--------------+    Summary: RIGHT: - There is no evidence of deep vein thrombosis in the lower extremity.  - No cystic structure found in the popliteal fossa. - Ultrasound characteristics of enlarged lymph nodes are noted in the groin. Subcutaneous edema noted in area of calf and ankle  LEFT: - No evidence of common femoral vein obstruction. - Ultrasound characteristics of enlarged lymph nodes noted in the groin.  *See table(s) above for measurements and observations. Electronically signed by Servando Snare MD on 02/27/2022 at 6:04:43 PM.    Final        IMPRESSION/PLAN: 1. Stage IB, [id number], grade 3, triple  negative invasive ductal carcinoma of the right breast. Dr. Lisbeth Renshaw has reviewed her final pathology findings and her course to date.  She would benefit from adjuvant external radiotherapy to the breast  to reduce risks of local recurrence. We discussed the risks, benefits, short, and long term effects of radiotherapy, as well as the curative intent, and the patient is interested in proceeding.  I reviewed the delivery and logistics of radiotherapy and Dr. Lisbeth Renshaw recommends a course of 4 weeks of radiotherapy to the right breast. Written consent is obtained and placed in the chart, a copy was provided to the patient. She will simulate today.    In a visit lasting *** minutes, greater than 50% of the time was spent face to face reviewing her case, as well as in preparation of, discussing, and coordinating the patient's care.      Carola Rhine, [facility]    **Disclaimer: This note was dictated with voice recognition software. Similar sounding words can inadvertently be transcribed and this note may contain transcription errors which may not have been corrected upon publication of note.**

## 2022-03-21 ENCOUNTER — Other Ambulatory Visit: Payer: Self-pay

## 2022-03-21 ENCOUNTER — Inpatient Hospital Stay (HOSPITAL_BASED_OUTPATIENT_CLINIC_OR_DEPARTMENT_OTHER): Payer: Medicare Other | Admitting: Hematology and Oncology

## 2022-03-21 ENCOUNTER — Encounter: Payer: Self-pay | Admitting: Hematology and Oncology

## 2022-03-21 VITALS — BP 111/61 | HR 82 | Temp 97.8°F | Resp 16 | Ht 59.0 in | Wt 218.5 lb

## 2022-03-21 DIAGNOSIS — Z171 Estrogen receptor negative status [ER-]: Secondary | ICD-10-CM | POA: Diagnosis not present

## 2022-03-21 DIAGNOSIS — C50411 Malignant neoplasm of upper-outer quadrant of right female breast: Secondary | ICD-10-CM

## 2022-03-21 DIAGNOSIS — Z5111 Encounter for antineoplastic chemotherapy: Secondary | ICD-10-CM | POA: Diagnosis not present

## 2022-03-21 NOTE — Progress Notes (Signed)
[facility] NOTE  Patient Care Team: Sonia Side., FNP as PCP - General (Family Medicine) Josue Hector, MD as PCP - Cardiology (Cardiology) Mauro Kaufmann, RN as Oncology Nurse Navigator Rockwell Germany, RN as Oncology Nurse Navigator Donnie Mesa, MD as Consulting Physician (General Surgery) [name] Pike, MD as Consulting Physician (Hematology and Oncology) Kyung Rudd, MD as Consulting Physician (Radiation Oncology)  CHIEF COMPLAINTS/PURPOSE OF CONSULTATION:  Breast cancer follow-up   HISTORY OF PRESENTING ILLNESS:   [name] 61 y.o. female is here because of recent diagnosis of right breast IDC  I reviewed her records extensively and collaborated the history with the patient.  SUMMARY OF ONCOLOGIC HISTORY: Oncology History  Malignant neoplasm of upper-outer quadrant of female breast ([place])  10/07/2021 Mammogram   Right breast, possible mass warrants further evaluation, no findings suspicious for malignancy in the left breast.Targeted ultrasound is performed, showing a 8 x 6 x 7 mm irregular mass at the right breast 1 o'clock 13 cm from nipple correlating to the mammographic mass. Ultrasound of the right axilla is negative.   10/27/2021 Pathology Results   Per verbal report at breast [facility] today, grade 3 IDC, triple negative, Ki 67 25%   11/02/2021 Initial Diagnosis   Malignant neoplasm of upper-outer quadrant of female breast ([place])   11/21/2021 Genetic Testing   No pathogenic variants detected in [facility] CustomNext-Cancer +RNAinsight Panel.  Report date is November 21, 2021.   The CustomNext-Cancer+RNAinsight panel offered by [facility] includes sequencing and rearrangement analysis for the following 47 genes:  APC, ATM, AXIN2, BARD1, BMPR1A, BRCA1, BRCA2, BRIP1, CDH1, CDK4, CDKN2A, CHEK2, DICER1, EPCAM, GREM1, HOXB13, MEN1, MLH1, MSH2, MSH3, MSH6, MUTYH, NBN, NF1, NF2, NTHL1, PALB2, PMS2, POLD1, POLE, PTEN, RAD51C, RAD51D, RECQL, RET,  SDHA, SDHAF2, SDHB, SDHC, SDHD, SMAD4, SMARCA4, STK11, TP53, TSC1, TSC2, and VHL.  RNA data is routinely analyzed for use in variant interpretation for all genes.   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  Prior prognostic showed ER 0% negative PR 0% negative, HER2 low with IHC of 1+ and Ki-67 of 25%.   12/30/2021 -  Chemotherapy   Patient is on Treatment Plan : BREAST TC q21d      Interval History  She is here for a follow up after cycle 4 of TC.  She says this last one was different. She continues to have leg swelling, She had BLE swelling which was negative for DVT. Last time she came, she was worried about the groin lymphadenopathy that was mentioned to her and was very worried that this is related to her breast cancer. She had a CT imaging for the same which was unremarkable, reactive lymphadenopathy. She now tells me that she thinks glipizide has been contributing to leg swelling. She held it for a few days, this controlled her leg swelling. She had a few questions about radiation today. Overall she feels well. History of PE in 2008, took anticoagulation.  MEDICAL HISTORY:  Past Medical History:  Diagnosis Date   Allergy    Anginal pain ([place])    a. NL cath in 2008;  b. Myoview 03/2011: dec uptake along mid anterior wall on stress imaging -> ? attenuation vs. ischemia, EF 65%;  c. 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 2008 -> coumadin x 6 mos.   Rheumatoid arthritis ([place])    Sleep apnea    on CPAP 02/2018   SOB (shortness of breath)    Thyroid disease    TIA (transient ischemic attack)    Urinary urgency    Vitamin D deficiency     SURGICAL HISTORY: Past Surgical History:  Procedure Laterality Date   ABDOMINAL HYSTERECTOMY  2005   APPENDECTOMY     BREAST LUMPECTOMY WITH RADIOACTIVE SEED AND SENTINEL LYMPH NODE BIOPSY Right 11/16/2021   Procedure: RIGHT BREAST RADIOACTIVE SEED LOCALIZED LUMPECTOMY WITH RIGHT AXILLARY SENTINEL LYMPH NODE BIOPSY;  Surgeon: Donnie Mesa, MD;  Location: [facility];  Service: General;  Laterality: Right;  LMA & PEC BLOCK   CARDIAC CATHETERIZATION     Normal   CARDIAC CATHETERIZATION N/A 09/30/2014   Procedure: Left Heart Cath and Coronary Angiography;  Surgeon: Sherren Mocha, MD;  Location: [facility];  Service: Cardiovascular;  Laterality: N/A;   LAPAROSCOPIC APPENDECTOMY N/A 06/03/2012   Procedure: APPENDECTOMY LAPAROSCOPIC;  Surgeon: Stark Klein, MD;  Location: [facility];  Service: General;  Laterality: N/A;   Left knee surgery  2008   LEG SURGERY     TONSILLECTOMY     TOTAL HIP ARTHROPLASTY Left 06/11/2018   Procedure: LEFT TOTAL HIP ARTHROPLASTY ANTERIOR APPROACH;  Surgeon: Meredith Pel, MD;  Location: [facility];  Service: Orthopedics;  Laterality: Left;   TOTAL KNEE ARTHROPLASTY Left 08/22/2016   Procedure: TOTAL KNEE ARTHROPLASTY;  Surgeon: Meredith Pel, MD;  Location: [facility];  Service: Orthopedics;  Laterality: Left;   TUBAL LIGATION  1989    SOCIAL HISTORY: Social History   Socioeconomic History   Marital status: Divorced    Spouse name: Not on file   Number of  children: 3   Years of education: Not on file   Highest education level: Not on file  Occupational History   Occupation: UNEMPLOYED    Employer: DISABLED  Tobacco Use   Smoking status: Former    Packs/day: 0.50    Years: 15.00    Total pack years: 7.50    Types: Cigarettes    Quit date: 04/24/1985    Years since quitting: 36.9   Smokeless tobacco: Never  Vaping Use   Vaping Use: Never used  Substance and Sexual Activity   Alcohol use: No    Alcohol/week: 0.0 standard drinks of alcohol   Drug use: Not Currently    Types: Marijuana   Sexual activity: Not Currently    Birth control/protection:  Surgical    Comment: hyst  Other Topics Concern   Not on file  Social History Narrative   Lives in [place] by herself.  Disabled.  Occasional Tea .  3 kids, 6 grandkids.     Water aerobics 3 times a week.   Social Determinants of Health   Financial Resource Strain: Medium Risk (11/02/2021)   Overall Financial Resource Strain (CARDIA)    Difficulty of Paying Living Expenses: Somewhat hard  Food Insecurity: Unknown (11/02/2021)   Hunger Vital Sign    Worried About Running Out of Food in the Last Year: Not on file    Ran Out of Food in the Last Year: Never true  Transportation Needs: No Transportation Needs (11/02/2021)   PRAPARE - Hydrologist (Medical): No    Lack of Transportation (Non-Medical): No  Physical Activity: Not on file  Stress: Not on file  Social Connections: Not on file  Intimate Partner Violence: Not on file    FAMILY HISTORY: Family History  Problem Relation Age of Onset   Emphysema Mother    Arthritis Mother    Heart failure Mother        alive @ 14   Stroke Mother    Diabetes Mother    Hypertension Mother    Hyperlipidemia Mother    Depression Mother    Anxiety disorder Mother    Heart disease Mother    Alcoholism Mother    Depression Father    Heart disease Father        died @ 37's.   Stroke Father    Diabetes Father     Hyperlipidemia Father    Hypertension Father    Bipolar disorder Father    Sleep apnea Father    Alcoholism Father    Drug abuse Father    Sudden death Father    Diabetes Sister    Asthma Brother    Breast cancer Maternal Aunt 39 - 63   Colon cancer Maternal Grandfather        dx after 105   Heart disease Paternal Grandfather    Breast cancer Cousin        dx 22s   Esophageal cancer Neg Hx    Stomach cancer Neg Hx    Rectal cancer Neg Hx     ALLERGIES:  is allergic to hydrocodone-acetaminophen, penicillins, shellfish allergy, latex, and sulfa antibiotics.  MEDICATIONS:  Current Outpatient Medications  Medication Sig Dispense Refill   ACCU-CHEK GUIDE test strip      albuterol (PROVENTIL HFA;VENTOLIN HFA) 108 (90 Base) MCG/ACT inhaler Inhale 2 puffs into the lungs every 6 (six) hours as needed for wheezing or shortness of breath.     aspirin 81 MG chewable tablet Chew 81 mg by mouth daily.     atorvastatin (LIPITOR) 40 MG tablet Take 40 mg by mouth at bedtime.      busPIRone (BUSPAR) 30 MG tablet Take 30 mg by mouth 2 (two) times daily.     carvedilol (COREG) 6.25 MG tablet Take 1 tablet (6.25 mg total) by mouth 2 (two) times daily. 180 tablet 3   dexamethasone (DECADRON) 4 MG tablet Take 2 tabs by mouth 2 times daily starting day before chemo. Then take 2 tabs daily for 2 days starting day after chemo. Take with food. (Patient not taking: Reported on 03/20/2022) 30 tablet 1   diclofenac (VOLTAREN) 75 MG EC tablet Take 75 mg by mouth 2 (two) times daily.     diclofenac  Sodium (VOLTAREN) 1 % GEL Apply 2 g topically 4 (four) times daily as needed (joint/muscle pain).     EPINEPHrine 0.3 mg/0.3 mL IJ SOAJ injection Inject 0.3 mg into the muscle as needed for anaphylaxis. Follow package instructions as needed for severe allergy or anaphylactic reaction. 2 each 3   furosemide (LASIX) 40 MG tablet Take 60 mg by mouth daily.     hydrOXYzine (VISTARIL) 25 MG capsule Take 25 mg by mouth 3  (three) times daily as needed for itching.     isosorbide dinitrate (ISORDIL) 20 MG tablet Take 20 mg by mouth daily.     lidocaine (LIDODERM) 5 % Place 1 patch onto the skin daily as needed for pain.     lidocaine-prilocaine (EMLA) cream Apply to affected area once (Patient not taking: Reported on 03/20/2022) 30 g 3   lisinopril (PRINIVIL,ZESTRIL) 20 MG tablet Take 20 mg by mouth daily.      loperamide (IMODIUM) 2 MG capsule Take 1 capsule (2 mg total) by mouth 4 (four) times daily as needed for diarrhea or loose stools. 12 capsule 0   metFORMIN (GLUCOPHAGE) 500 MG tablet Take 1 tablet (500 mg total) by mouth daily with breakfast. 90 tablet 0   MYRBETRIQ 25 MG TB24 tablet Take 1 tablet (25 mg total) by mouth daily. (Patient not taking: Reported on 03/20/2022) 30 tablet 0   nystatin (MYCOSTATIN/NYSTOP) powder Apply 1 application topically 3 (three) times daily as needed for rash.     ondansetron (ZOFRAN) 8 MG tablet Take 1 tablet (8 mg total) by mouth every 8 (eight) hours as needed for nausea or vomiting. Start on the third day after chemotherapy. 30 tablet 1   ondansetron (ZOFRAN-ODT) 4 MG disintegrating tablet Take 1 tablet (4 mg total) by mouth every 8 (eight) hours as needed for nausea or vomiting. (Patient not taking: Reported on 03/20/2022) 20 tablet 0   oxyCODONE (OXY IR/ROXICODONE) 5 MG immediate release tablet Take 1 tablet (5 mg total) by mouth every 6 (six) hours as needed for severe pain. 20 tablet 0   pantoprazole (PROTONIX) 40 MG tablet Take 1 tablet (40 mg total) by mouth 2 (two) times daily. (Patient taking differently: Take 40 mg by mouth daily.) 60 tablet 0   potassium chloride (KLOR-CON M10) 10 MEQ tablet Take 1 tablet (10 mEq total) by mouth 2 (two) times daily. 60 tablet 1   prochlorperazine (COMPAZINE) 10 MG tablet Take 1 tablet (10 mg total) by mouth every 6 (six) hours as needed for nausea or vomiting. 30 tablet 1   Semaglutide,0.25 or 0.5MG/DOS, (OZEMPIC, 0.25 OR 0.5  MG/DOSE,) 2 MG/1.5ML SOPN Inject 0.5 mg into the skin once a week. (Patient not taking: Reported on 03/20/2022) 3 mL 0   sertraline (ZOLOFT) 25 MG tablet Take 1 tablet (25 mg total) by mouth daily. 90 tablet 1   valACYclovir (VALTREX) 1000 MG tablet Take 1 tablet (1,000 mg total) by mouth 2 (two) times daily. (Patient not taking: Reported on 03/20/2022) 14 tablet 0   Vitamin D, Ergocalciferol, (DRISDOL) 1.25 MG (50000 UNIT) CAPS capsule Take 1 capsule (50,000 Units total) by mouth every 7 (seven) days. 4 capsule 0   No current facility-administered medications for this visit.    PHYSICAL EXAMINATION: ECOG PERFORMANCE STATUS: 1 - Symptomatic but completely ambulatory  Vitals:   03/21/22 1351  BP: 111/61  Pulse: 82  Resp: 16  Temp: 97.8 F (36.6 C)  SpO2: 100%    Filed Weights   03/21/22 1351  Weight: 218 lb 8 oz (99.1 kg)   Physical Exam Constitutional:      Appearance: Normal appearance.  Cardiovascular:     Rate and Rhythm: Normal rate and regular rhythm.  Pulmonary:     Effort: Pulmonary effort is normal.     Breath sounds: Normal breath sounds.  Chest:     Comments: Right breast appears to have healed well.  Abdominal:     General: Abdomen is flat.     Palpations: Abdomen is soft.  Musculoskeletal:        General: Swelling (Bilateral symmetrical lower extremity swelling noted, patient says it has improved) present.     Cervical back: Normal range of motion and neck supple. No rigidity.  Lymphadenopathy:     Cervical: No cervical adenopathy.  Skin:    General: Skin is warm and dry.  Neurological:     General: No focal deficit present.     Mental Status: She is alert.      LABORATORY DATA:  I have reviewed the data as listed Lab Results  Component Value Date   WBC 11.0 (H) 03/03/2022   HGB 10.5 (L) 03/03/2022   HCT 32.2 (L) 03/03/2022   MCV 92.8 03/03/2022   PLT 226 03/03/2022   Lab Results  Component Value Date   NA 144 03/03/2022   K 3.6 03/03/2022    CL 108 03/03/2022   CO2 27 03/03/2022    RADIOGRAPHIC STUDIES: I have personally reviewed the radiological reports and agreed with the findings in the report.  ASSESSMENT AND PLAN:   Malignant neoplasm of upper-outer quadrant of female breast [facility]) This is a very pleasant 61 year old female patient with past medical history significant for congestive heart failure, coronary artery disease, diabetes, hypertension, dyslipidemia history of pulmonary embolism back in 2008 for which she needed anticoagulation, rheumatoid arthritis, obstructive sleep apnea who is newly diagnosed with right breast IDC noticed on screening mammogram.   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  She is now s/p 4 cycles of TC.  She will now proceed with adjuvant radiation. I dont believe her BLE swelling is necessarily related to her chemo, she tells me that this could be from glipizide since she recently started this medication. She has noticed some improvement since she stopped it She saw her cardiologist, no major cardiac issues BLE vasc US with no DVT CT abdomen/pelvis with no major concerns, groin LN is reactive likely. At this time, she is agreeable to proceed with adjuvant radiation No role for anti estrogen therapy since she had triple neg breast cancer. She will RTC in 3 months for FU.    Total time spent: 30 minutes including history, physical exam, review of records, counseling and coordination of care All questions were answered. The patient knows to call the clinic with any problems, questions or concerns.    [name] Pike, MD 03/21/22

## 2022-03-21 NOTE — Assessment & Plan Note (Addendum)
This is a very pleasant 61 year old female patient with past medical history significant for congestive heart failure, coronary artery disease, diabetes, hypertension, dyslipidemia history of pulmonary embolism back in 2008 for which she needed anticoagulation, rheumatoid arthritis, obstructive sleep apnea who is newly diagnosed with right breast IDC noticed on screening mammogram.   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  She is now s/p 4 cycles of TC.  She will now proceed with adjuvant radiation. I dont believe her BLE swelling is necessarily related to her chemo, she tells me that this could be from glipizide since she recently started this medication. She has noticed some improvement since she stopped it She saw her cardiologist, no major cardiac issues BLE vasc US with no DVT CT abdomen/pelvis with no major concerns, groin LN is reactive likely. At this time, she is agreeable to proceed with adjuvant radiation No role for anti estrogen therapy since she had triple neg breast cancer. She will RTC in 3 months for FU.

## 2022-03-23 ENCOUNTER — Other Ambulatory Visit: Payer: Self-pay

## 2022-03-23 ENCOUNTER — Ambulatory Visit
Admission: RE | Admit: 2022-03-23 | Discharge: 2022-03-23 | Disposition: A | Discharge status: Home / Self Care | Payer: Medicare Other | Source: Ambulatory Visit | Attending: Radiation Oncology | Admitting: Radiation Oncology

## 2022-03-23 ENCOUNTER — Encounter (HOSPITAL_COMMUNITY): Payer: Self-pay

## 2022-03-23 ENCOUNTER — Emergency Department (HOSPITAL_COMMUNITY): Payer: Medicare Other

## 2022-03-23 ENCOUNTER — Encounter: Payer: Self-pay | Admitting: Radiation Oncology

## 2022-03-23 ENCOUNTER — Inpatient Hospital Stay (HOSPITAL_COMMUNITY)
Admission: EM | Admit: 2022-03-23 | Discharge: 2022-03-28 | DRG: 291 | Disposition: A | Discharge status: Home / Self Care | Payer: Medicare Other | Attending: Internal Medicine | Admitting: Internal Medicine

## 2022-03-23 VITALS — BP 112/58 | HR 87 | Temp 98.1°F | Resp 18 | Ht 59.0 in | Wt 217.5 lb

## 2022-03-23 DIAGNOSIS — Z171 Estrogen receptor negative status [ER-]: Secondary | ICD-10-CM | POA: Insufficient documentation

## 2022-03-23 DIAGNOSIS — Z9221 Personal history of antineoplastic chemotherapy: Secondary | ICD-10-CM

## 2022-03-23 DIAGNOSIS — M47816 Spondylosis without myelopathy or radiculopathy, lumbar region: Secondary | ICD-10-CM | POA: Insufficient documentation

## 2022-03-23 DIAGNOSIS — I110 Hypertensive heart disease with heart failure: Secondary | ICD-10-CM | POA: Diagnosis not present

## 2022-03-23 DIAGNOSIS — F419 Anxiety disorder, unspecified: Secondary | ICD-10-CM | POA: Diagnosis present

## 2022-03-23 DIAGNOSIS — E119 Type 2 diabetes mellitus without complications: Secondary | ICD-10-CM | POA: Insufficient documentation

## 2022-03-23 DIAGNOSIS — M069 Rheumatoid arthritis, unspecified: Secondary | ICD-10-CM | POA: Insufficient documentation

## 2022-03-23 DIAGNOSIS — Z96652 Presence of left artificial knee joint: Secondary | ICD-10-CM | POA: Diagnosis present

## 2022-03-23 DIAGNOSIS — Z86711 Personal history of pulmonary embolism: Secondary | ICD-10-CM | POA: Insufficient documentation

## 2022-03-23 DIAGNOSIS — M5137 Other intervertebral disc degeneration, lumbosacral region: Secondary | ICD-10-CM | POA: Diagnosis present

## 2022-03-23 DIAGNOSIS — F418 Other specified anxiety disorders: Secondary | ICD-10-CM | POA: Diagnosis present

## 2022-03-23 DIAGNOSIS — Z79899 Other long term (current) drug therapy: Secondary | ICD-10-CM | POA: Insufficient documentation

## 2022-03-23 DIAGNOSIS — Z7984 Long term (current) use of oral hypoglycemic drugs: Secondary | ICD-10-CM

## 2022-03-23 DIAGNOSIS — Z791 Long term (current) use of non-steroidal anti-inflammatories (NSAID): Secondary | ICD-10-CM | POA: Insufficient documentation

## 2022-03-23 DIAGNOSIS — D638 Anemia in other chronic diseases classified elsewhere: Secondary | ICD-10-CM | POA: Diagnosis present

## 2022-03-23 DIAGNOSIS — Z811 Family history of alcohol abuse and dependence: Secondary | ICD-10-CM

## 2022-03-23 DIAGNOSIS — R0789 Other chest pain: Secondary | ICD-10-CM | POA: Diagnosis not present

## 2022-03-23 DIAGNOSIS — Z79624 Long term (current) use of inhibitors of nucleotide synthesis: Secondary | ICD-10-CM | POA: Insufficient documentation

## 2022-03-23 DIAGNOSIS — M4316 Spondylolisthesis, lumbar region: Secondary | ICD-10-CM | POA: Insufficient documentation

## 2022-03-23 DIAGNOSIS — Z96642 Presence of left artificial hip joint: Secondary | ICD-10-CM | POA: Insufficient documentation

## 2022-03-23 DIAGNOSIS — I440 Atrioventricular block, first degree: Secondary | ICD-10-CM | POA: Diagnosis present

## 2022-03-23 DIAGNOSIS — Z8673 Personal history of transient ischemic attack (TIA), and cerebral infarction without residual deficits: Secondary | ICD-10-CM | POA: Insufficient documentation

## 2022-03-23 DIAGNOSIS — M129 Arthropathy, unspecified: Secondary | ICD-10-CM | POA: Insufficient documentation

## 2022-03-23 DIAGNOSIS — G4733 Obstructive sleep apnea (adult) (pediatric): Secondary | ICD-10-CM | POA: Diagnosis present

## 2022-03-23 DIAGNOSIS — Z7952 Long term (current) use of systemic steroids: Secondary | ICD-10-CM | POA: Insufficient documentation

## 2022-03-23 DIAGNOSIS — C50411 Malignant neoplasm of upper-outer quadrant of right female breast: Secondary | ICD-10-CM | POA: Insufficient documentation

## 2022-03-23 DIAGNOSIS — Z7982 Long term (current) use of aspirin: Secondary | ICD-10-CM | POA: Insufficient documentation

## 2022-03-23 DIAGNOSIS — K219 Gastro-esophageal reflux disease without esophagitis: Secondary | ICD-10-CM | POA: Insufficient documentation

## 2022-03-23 DIAGNOSIS — E559 Vitamin D deficiency, unspecified: Secondary | ICD-10-CM | POA: Insufficient documentation

## 2022-03-23 DIAGNOSIS — G4730 Sleep apnea, unspecified: Secondary | ICD-10-CM | POA: Insufficient documentation

## 2022-03-23 DIAGNOSIS — Z9189 Other specified personal risk factors, not elsewhere classified: Secondary | ICD-10-CM

## 2022-03-23 DIAGNOSIS — Z800 Family history of malignant neoplasm of digestive organs: Secondary | ICD-10-CM | POA: Insufficient documentation

## 2022-03-23 DIAGNOSIS — I200 Unstable angina: Secondary | ICD-10-CM

## 2022-03-23 DIAGNOSIS — Z87891 Personal history of nicotine dependence: Secondary | ICD-10-CM | POA: Insufficient documentation

## 2022-03-23 DIAGNOSIS — Z818 Family history of other mental and behavioral disorders: Secondary | ICD-10-CM

## 2022-03-23 DIAGNOSIS — Z8249 Family history of ischemic heart disease and other diseases of the circulatory system: Secondary | ICD-10-CM

## 2022-03-23 DIAGNOSIS — E785 Hyperlipidemia, unspecified: Secondary | ICD-10-CM | POA: Diagnosis present

## 2022-03-23 DIAGNOSIS — I5032 Chronic diastolic (congestive) heart failure: Secondary | ICD-10-CM | POA: Diagnosis present

## 2022-03-23 DIAGNOSIS — E079 Disorder of thyroid, unspecified: Secondary | ICD-10-CM | POA: Diagnosis present

## 2022-03-23 DIAGNOSIS — Z20822 Contact with and (suspected) exposure to covid-19: Secondary | ICD-10-CM | POA: Diagnosis present

## 2022-03-23 DIAGNOSIS — Z825 Family history of asthma and other chronic lower respiratory diseases: Secondary | ICD-10-CM

## 2022-03-23 DIAGNOSIS — E1169 Type 2 diabetes mellitus with other specified complication: Secondary | ICD-10-CM | POA: Diagnosis present

## 2022-03-23 DIAGNOSIS — F32A Depression, unspecified: Secondary | ICD-10-CM | POA: Diagnosis present

## 2022-03-23 DIAGNOSIS — Z803 Family history of malignant neoplasm of breast: Secondary | ICD-10-CM | POA: Insufficient documentation

## 2022-03-23 DIAGNOSIS — K5900 Constipation, unspecified: Secondary | ICD-10-CM | POA: Insufficient documentation

## 2022-03-23 DIAGNOSIS — Z833 Family history of diabetes mellitus: Secondary | ICD-10-CM

## 2022-03-23 DIAGNOSIS — Z880 Allergy status to penicillin: Secondary | ICD-10-CM

## 2022-03-23 DIAGNOSIS — I10 Essential (primary) hypertension: Secondary | ICD-10-CM | POA: Diagnosis present

## 2022-03-23 DIAGNOSIS — Z885 Allergy status to narcotic agent status: Secondary | ICD-10-CM

## 2022-03-23 DIAGNOSIS — Z853 Personal history of malignant neoplasm of breast: Secondary | ICD-10-CM

## 2022-03-23 DIAGNOSIS — Z882 Allergy status to sulfonamides status: Secondary | ICD-10-CM

## 2022-03-23 DIAGNOSIS — E876 Hypokalemia: Secondary | ICD-10-CM | POA: Diagnosis present

## 2022-03-23 DIAGNOSIS — Z90710 Acquired absence of both cervix and uterus: Secondary | ICD-10-CM

## 2022-03-23 DIAGNOSIS — R609 Edema, unspecified: Secondary | ICD-10-CM | POA: Insufficient documentation

## 2022-03-23 DIAGNOSIS — I5033 Acute on chronic diastolic (congestive) heart failure: Secondary | ICD-10-CM | POA: Diagnosis present

## 2022-03-23 DIAGNOSIS — E1165 Type 2 diabetes mellitus with hyperglycemia: Secondary | ICD-10-CM | POA: Diagnosis present

## 2022-03-23 DIAGNOSIS — Z91040 Latex allergy status: Secondary | ICD-10-CM

## 2022-03-23 DIAGNOSIS — Z6841 Body Mass Index (BMI) 40.0 and over, adult: Secondary | ICD-10-CM

## 2022-03-23 DIAGNOSIS — N179 Acute kidney failure, unspecified: Secondary | ICD-10-CM | POA: Diagnosis present

## 2022-03-23 DIAGNOSIS — G8929 Other chronic pain: Secondary | ICD-10-CM | POA: Diagnosis present

## 2022-03-23 DIAGNOSIS — I509 Heart failure, unspecified: Secondary | ICD-10-CM | POA: Insufficient documentation

## 2022-03-23 DIAGNOSIS — Z823 Family history of stroke: Secondary | ICD-10-CM

## 2022-03-23 DIAGNOSIS — C50419 Malignant neoplasm of upper-outer quadrant of unspecified female breast: Secondary | ICD-10-CM | POA: Diagnosis present

## 2022-03-23 DIAGNOSIS — I959 Hypotension, unspecified: Secondary | ICD-10-CM | POA: Insufficient documentation

## 2022-03-23 LAB — COMPREHENSIVE METABOLIC PANEL
ALT: 23 U/L (ref 0–44)
AST: 23 U/L (ref 15–41)
Albumin: 3.2 g/dL — ABNORMAL LOW (ref 3.5–5.0)
Alkaline Phosphatase: 52 U/L (ref 38–126)
Anion gap: 13 (ref 5–15)
BUN: 13 mg/dL (ref 8–23)
CO2: 26 mmol/L (ref 22–32)
Calcium: 9.1 mg/dL (ref 8.9–10.3)
Chloride: 106 mmol/L (ref 98–111)
Creatinine, Ser: 0.91 mg/dL (ref 0.44–1.00)
GFR, Estimated: >60 mL/min (ref >60)
Glucose, Bld: 188 mg/dL — ABNORMAL HIGH (ref 70–99)
Potassium: 3.1 mmol/L — ABNORMAL LOW (ref 3.5–5.1)
Sodium: 145 mmol/L (ref 135–145)
Total Bilirubin: 0.4 mg/dL (ref 0.3–1.2)
Total Protein: 5.3 g/dL — ABNORMAL LOW (ref 6.5–8.1)

## 2022-03-23 LAB — BRAIN NATRIURETIC PEPTIDE: B Natriuretic Peptide: 22.9 pg/mL (ref 0.0–100.0)

## 2022-03-23 LAB — GLUCOSE, CAPILLARY: Glucose-Capillary: 176 mg/dL — ABNORMAL HIGH (ref 70–99)

## 2022-03-23 MED ORDER — IOHEXOL 350 MG/ML SOLN
75.0000 mL | Freq: Once | INTRAVENOUS | Status: AC | PRN
  Administered 2022-03-23: 75 mL via INTRAVENOUS

## 2022-03-23 NOTE — Progress Notes (Addendum)
Nursing consult for Malignant neoplasm of upper-outer quadrant of right breast in female, estrogen receptor negative ([place]). I verified patient's identity and began nursing interview.  Patient reports a dull ache in the RT breast/axilla area 4/10. No other issues conveyed at this time.  Meaningful use complete. Hysterectomy-NO chances of pregnancy. Glucose this am-176  BP (!) 112/58 (BP Location: Left Arm, Patient Position: Sitting)   Pulse 87   Temp 98.1 F (36.7 C) (Oral)   Resp 18   Ht '4\' 11"'$  (1.499 m)   Wt 217 lb 8 oz (98.7 kg)   SpO2 100%   BMI 43.93 kg/m   This concludes the interview.   Leandra Kern, LPN

## 2022-03-23 NOTE — ED Notes (Signed)
Patient transferred to recliner for comfort

## 2022-03-23 NOTE — ED Triage Notes (Signed)
Pt arrives to ED from home for chest pain, shortness of breath, left arm pain and swelling of all extremities for ongoing worsening symptoms for a few weeks.

## 2022-03-23 NOTE — ED Provider Triage Note (Signed)
Emergency Medicine Provider Triage Evaluation Note  [name] , a 61 y.o. female  was evaluated in triage.  Pt complains of SOB starting this morning. Has a history of CHF and PE but states that this feels different than last time she had a PE. Reports associated chills. Denies any changes to medications recently or recent infection. Patient recently finished chemotherapy and is planning on undergoing radiation therapy for breast cancer. Denies fever, chest pain, or chest tightness.  Review of Systems  Positive: As above Negative: As above  Physical Exam  BP (!) 108/51 (BP Location: Left Arm)   Pulse 85   Temp 98.1 F (36.7 C) (Oral)   Resp 20   SpO2 98%  Gen:   Awake, no distress uncomfortable Resp:  Normal effort, increased work of breathing.  No wheezing rales or rhonchi. MSK:   Moves extremities without difficulty Other:    Medical Decision Making  Medically screening exam initiated at 7:05 PM.  Appropriate orders placed.  [name] was informed that the remainder of the evaluation will be completed by another provider, this initial triage assessment does not replace that evaluation, and the importance of remaining in the ED until their evaluation is complete.   Luvenia Heller, PA-C 03/23/22 1916

## 2022-03-24 ENCOUNTER — Ambulatory Visit: Payer: Medicare Other | Admitting: Hematology and Oncology

## 2022-03-24 ENCOUNTER — Other Ambulatory Visit: Payer: Medicare Other

## 2022-03-24 ENCOUNTER — Observation Stay (HOSPITAL_BASED_OUTPATIENT_CLINIC_OR_DEPARTMENT_OTHER): Payer: Medicare Other

## 2022-03-24 ENCOUNTER — Ambulatory Visit: Payer: Medicare Other

## 2022-03-24 DIAGNOSIS — R079 Chest pain, unspecified: Secondary | ICD-10-CM | POA: Insufficient documentation

## 2022-03-24 DIAGNOSIS — E1169 Type 2 diabetes mellitus with other specified complication: Secondary | ICD-10-CM | POA: Diagnosis not present

## 2022-03-24 DIAGNOSIS — M5137 Other intervertebral disc degeneration, lumbosacral region: Secondary | ICD-10-CM

## 2022-03-24 DIAGNOSIS — Z171 Estrogen receptor negative status [ER-]: Secondary | ICD-10-CM

## 2022-03-24 DIAGNOSIS — I10 Essential (primary) hypertension: Secondary | ICD-10-CM

## 2022-03-24 DIAGNOSIS — F3289 Other specified depressive episodes: Secondary | ICD-10-CM

## 2022-03-24 DIAGNOSIS — E876 Hypokalemia: Secondary | ICD-10-CM

## 2022-03-24 DIAGNOSIS — E785 Hyperlipidemia, unspecified: Secondary | ICD-10-CM

## 2022-03-24 DIAGNOSIS — I5033 Acute on chronic diastolic (congestive) heart failure: Secondary | ICD-10-CM

## 2022-03-24 DIAGNOSIS — C50411 Malignant neoplasm of upper-outer quadrant of right female breast: Secondary | ICD-10-CM

## 2022-03-24 LAB — COMPREHENSIVE METABOLIC PANEL
ALT: 21 U/L (ref 0–44)
ALT: 23 U/L (ref 0–44)
AST: 19 U/L (ref 15–41)
AST: 23 U/L (ref 15–41)
Albumin: 3 g/dL — ABNORMAL LOW (ref 3.5–5.0)
Albumin: 3 g/dL — ABNORMAL LOW (ref 3.5–5.0)
Alkaline Phosphatase: 47 U/L (ref 38–126)
Alkaline Phosphatase: 47 U/L (ref 38–126)
Anion gap: 7 (ref 5–15)
Anion gap: 8 (ref 5–15)
BUN: 12 mg/dL (ref 8–23)
BUN: 11 mg/dL (ref 8–23)
CO2: 25 mmol/L (ref 22–32)
CO2: 26 mmol/L (ref 22–32)
Calcium: 8.6 mg/dL — ABNORMAL LOW (ref 8.9–10.3)
Calcium: 8.7 mg/dL — ABNORMAL LOW (ref 8.9–10.3)
Chloride: 109 mmol/L (ref 98–111)
Chloride: 107 mmol/L (ref 98–111)
Creatinine, Ser: 0.85 mg/dL (ref 0.44–1.00)
Creatinine, Ser: 0.88 mg/dL (ref 0.44–1.00)
GFR, Estimated: >60 mL/min (ref >60)
GFR, Estimated: >60 mL/min (ref >60)
Glucose, Bld: 151 mg/dL — ABNORMAL HIGH (ref 70–99)
Glucose, Bld: 203 mg/dL — ABNORMAL HIGH (ref 70–99)
Potassium: 3 mmol/L — ABNORMAL LOW (ref 3.5–5.1)
Potassium: 3.3 mmol/L — ABNORMAL LOW (ref 3.5–5.1)
Sodium: 141 mmol/L (ref 135–145)
Sodium: 141 mmol/L (ref 135–145)
Total Bilirubin: 0.2 mg/dL — ABNORMAL LOW (ref 0.3–1.2)
Total Bilirubin: 0.3 mg/dL (ref 0.3–1.2)
Total Protein: 5.3 g/dL — ABNORMAL LOW (ref 6.5–8.1)
Total Protein: 4.9 g/dL — ABNORMAL LOW (ref 6.5–8.1)

## 2022-03-24 LAB — ECHOCARDIOGRAM COMPLETE
Area-P 1/2: 4.68 cm2
Calc EF: 62.8 %
Height: 59 in
S' Lateral: 2.8 cm
Single Plane A2C EF: 64 %
Single Plane A4C EF: 61 %
Weight: 3360 oz

## 2022-03-24 LAB — CBC WITH DIFFERENTIAL/PLATELET
Abs Immature Granulocytes: 0.01 10*3/uL (ref 0.00–0.07)
Basophils Absolute: 0.1 10*3/uL (ref 0.0–0.1)
Basophils Relative: 1 %
Eosinophils Absolute: 0 10*3/uL (ref 0.0–0.5)
Eosinophils Relative: 1 %
HCT: 29.5 % — ABNORMAL LOW (ref 36.0–46.0)
Hemoglobin: 9.1 g/dL — ABNORMAL LOW (ref 12.0–15.0)
Immature Granulocytes: 0 %
Lymphocytes Relative: 41 %
Lymphs Abs: 1.8 10*3/uL (ref 0.7–4.0)
MCH: 29.7 pg (ref 26.0–34.0)
MCHC: 30.8 g/dL (ref 30.0–36.0)
MCV: 96.4 fL (ref 80.0–100.0)
Monocytes Absolute: 0.8 10*3/uL (ref 0.1–1.0)
Monocytes Relative: 19 %
Neutro Abs: 1.6 10*3/uL — ABNORMAL LOW (ref 1.7–7.7)
Neutrophils Relative %: 38 %
Platelets: 184 10*3/uL (ref 150–400)
RBC: 3.06 MIL/uL — ABNORMAL LOW (ref 3.87–5.11)
RDW: 17.7 % — ABNORMAL HIGH (ref 11.5–15.5)
WBC: 4.3 10*3/uL (ref 4.0–10.5)
nRBC: 0 % (ref 0.0–0.2)

## 2022-03-24 LAB — RESP PANEL BY RT-PCR (FLU A&B, COVID) ARPGX2
Influenza A by PCR: NEGATIVE
Influenza B by PCR: NEGATIVE
SARS Coronavirus 2 by RT PCR: NEGATIVE

## 2022-03-24 LAB — GLUCOSE, CAPILLARY
Glucose-Capillary: 131 mg/dL — ABNORMAL HIGH (ref 70–99)
Glucose-Capillary: 198 mg/dL — ABNORMAL HIGH (ref 70–99)

## 2022-03-24 LAB — TROPONIN I (HIGH SENSITIVITY)
Troponin I (High Sensitivity): 9 ng/L (ref <18)
Troponin I (High Sensitivity): 8 ng/L (ref <18)

## 2022-03-24 LAB — HIV ANTIBODY (ROUTINE TESTING W REFLEX): HIV Screen 4th Generation wRfx: NONREACTIVE

## 2022-03-24 LAB — PHOSPHORUS: Phosphorus: 4.7 mg/dL — ABNORMAL HIGH (ref 2.5–4.6)

## 2022-03-24 LAB — MAGNESIUM: Magnesium: 1.6 mg/dL — ABNORMAL LOW (ref 1.7–2.4)

## 2022-03-24 LAB — TSH: TSH: 0.467 u[IU]/mL (ref 0.350–4.500)

## 2022-03-24 IMAGING — CT CT ANGIO CHEST
2 of 6 series · 18 of 46 positions shown · IV contrast (omnipaque)
Comparison: 09/25/2019 CT and prior studies

CLINICAL DATA: 59-year-old female with chest pain and cough.

EXAM:
CT ANGIOGRAPHY CHEST WITH CONTRAST
TECHNIQUE: Multidetector CT imaging of the chest was performed using the
standard protocol during bolus administration of intravenous
contrast. Multiplanar CT image reconstructions and MIPs were
obtained to evaluate the vascular anatomy.
CONTRAST:  75mL OMNIPAQUE IOHEXOL 350 MG/ML SOLN

[Series 6: thins · axial · 0.75mm/px · z∈[+1015,+1268]mm · 15 of 279 slices shown]
[im 13/279  lung]
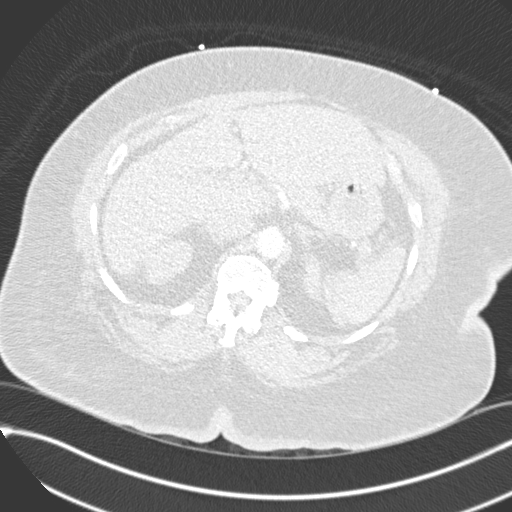
[im 37/279  soft-tissue]
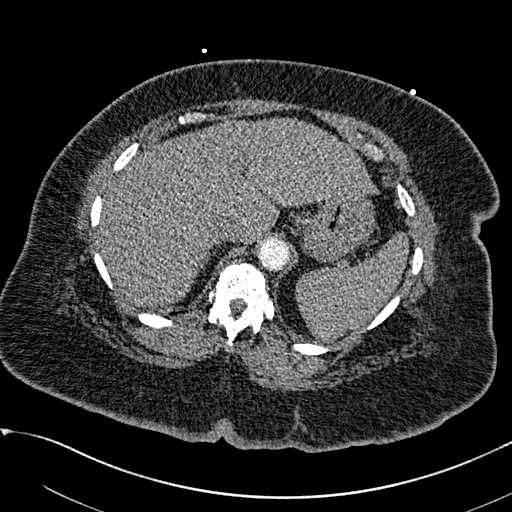
[im 49/279  lung]
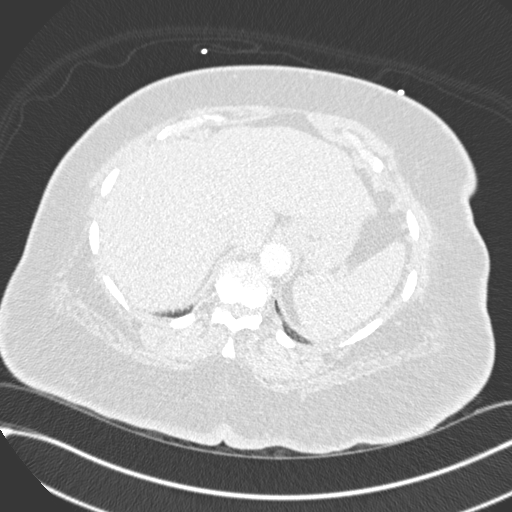
[im 73/279  soft-tissue]
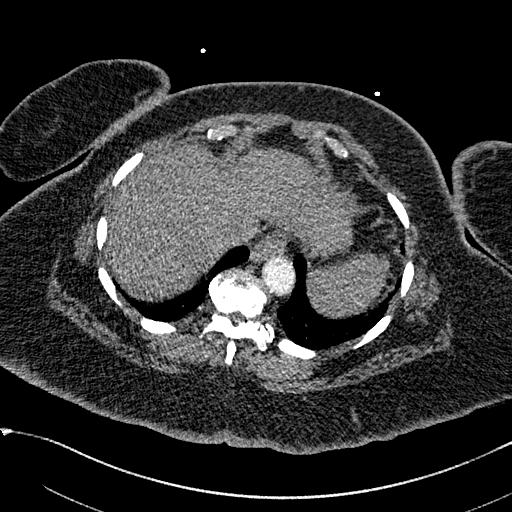
[im 85/279  lung]
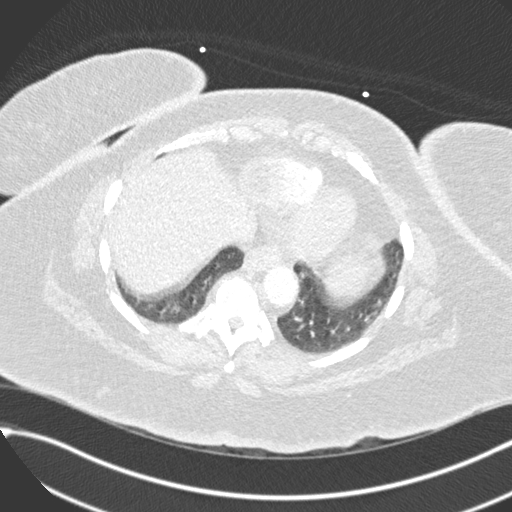
[im 109/279  soft-tissue]
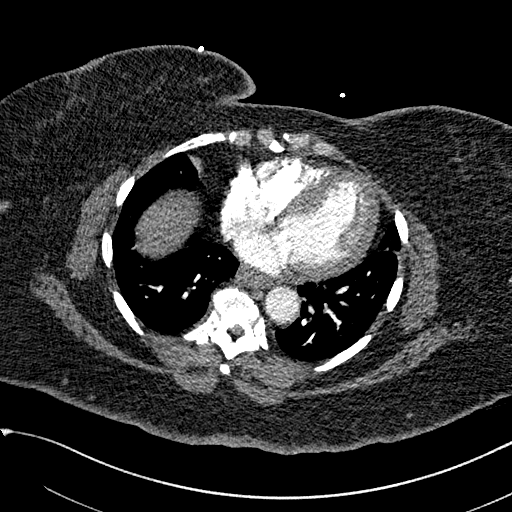
[im 121/279  lung]
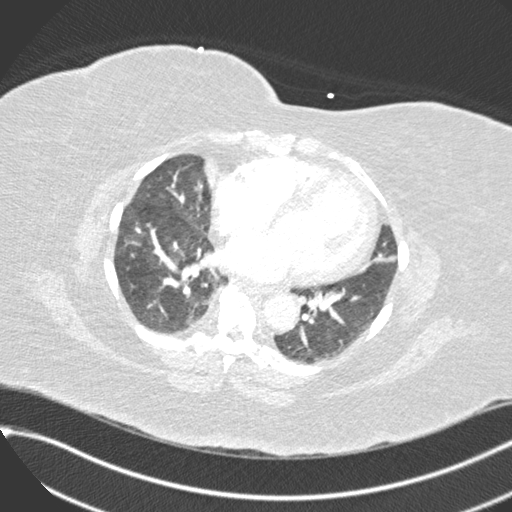
[im 146/279  soft-tissue]
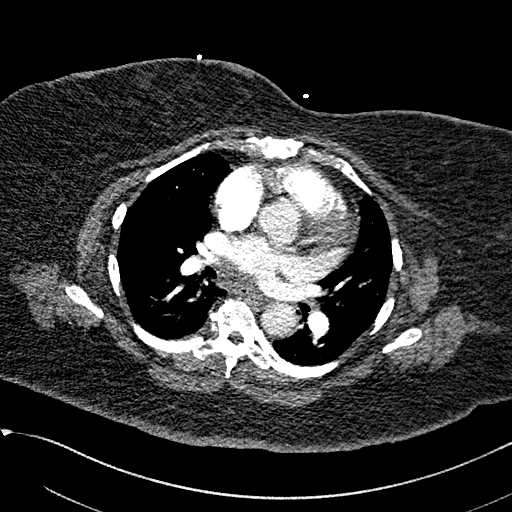
[im 158/279  lung]
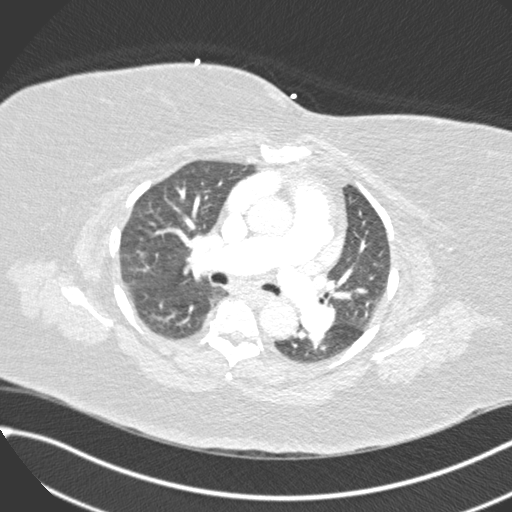
[im 170/279  soft-tissue]
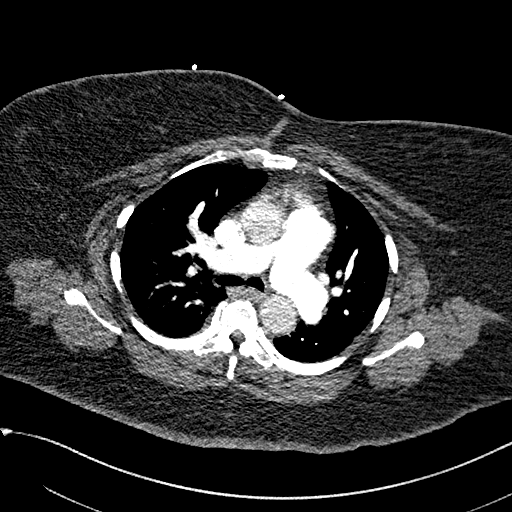
[im 194/279  lung]
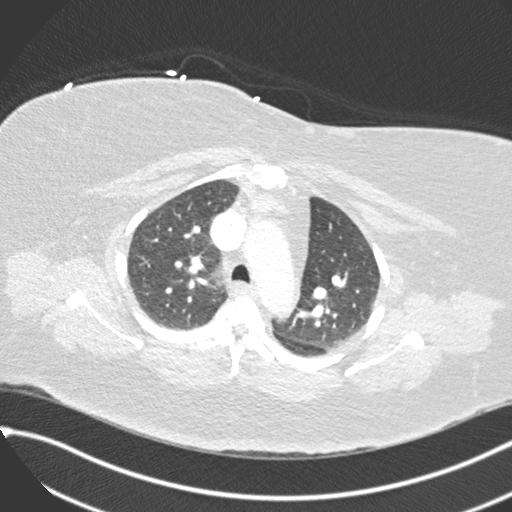
[im 206/279  soft-tissue]
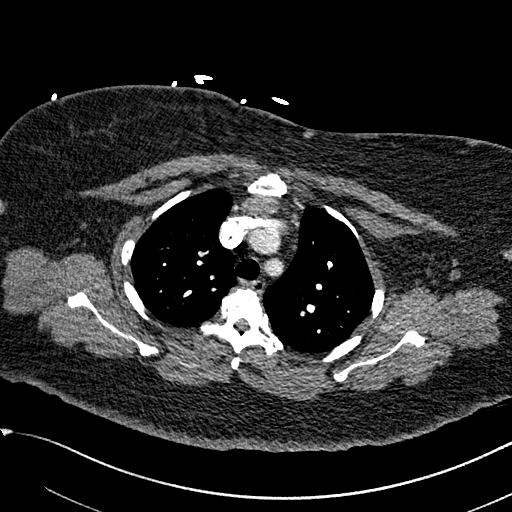
[im 230/279  lung]
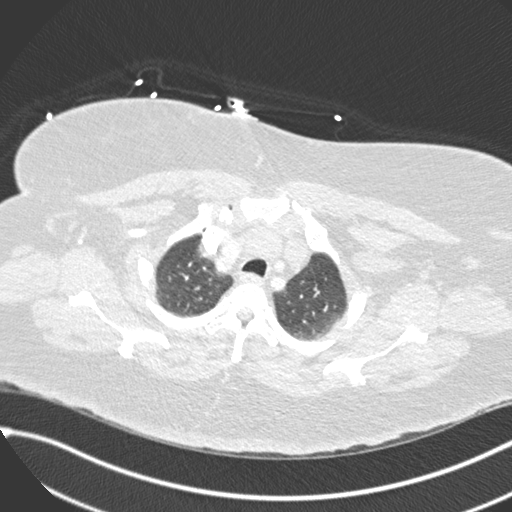
[im 242/279  soft-tissue]
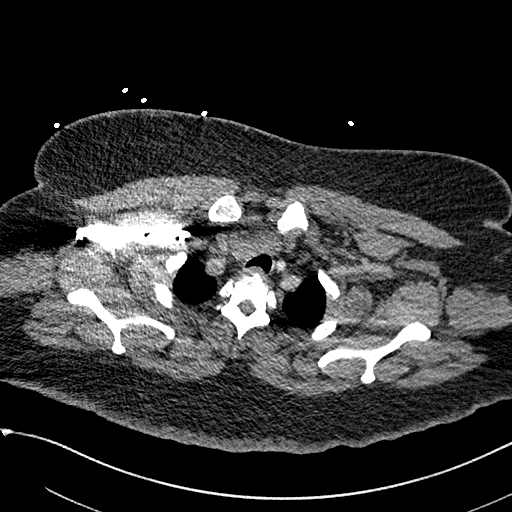
[im 266/279  lung]
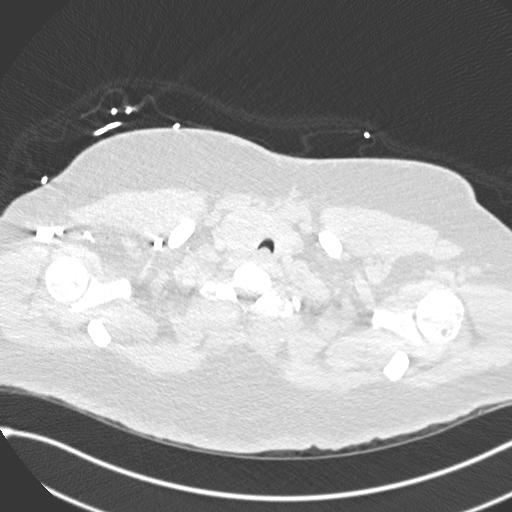

[Series 8: coronal mpr · coronal · 0.55mm/px · 3 of 159 slices shown]
[im 40/159  soft-tissue]
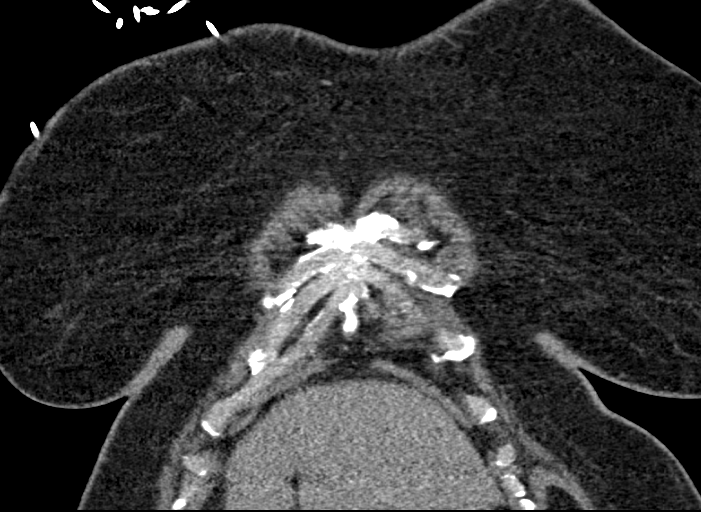
[im 80/159  soft-tissue]
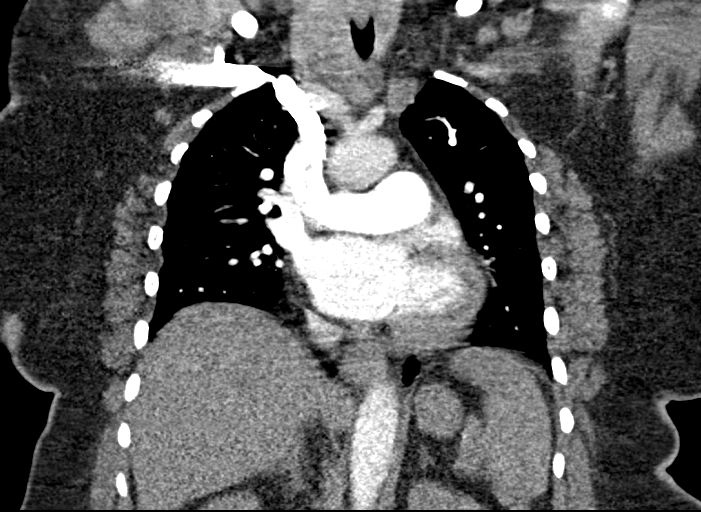
[im 119/159  soft-tissue]
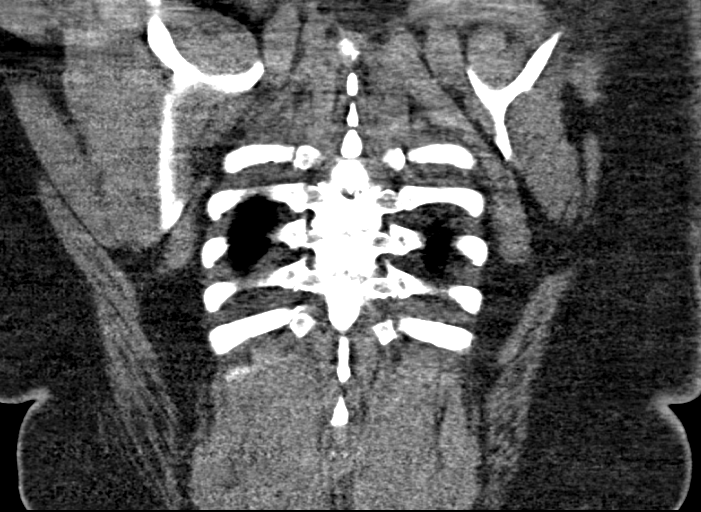

[18 of 46 positions shown; findings below may reference images not displayed]

FINDINGS: Cardiovascular: This is a technically satisfactory study. No
pulmonary emboli are identified. The pulmonary arteries are
prominent suggesting pulmonary arterial hypertension.

Mild cardiomegaly is noted.

No thoracic aortic aneurysm or pericardial effusion identified.

Mediastinum/Nodes: No enlarged mediastinal, hilar, or axillary lymph
nodes. Thyroid gland, trachea, and esophagus demonstrate no
significant findings.

Lungs/Pleura: No airspace disease, consolidation, nodule, mass,
pleural effusion or pneumothorax. Mild lingular scarring is again
noted.

Upper Abdomen: No acute abnormality

Musculoskeletal: No acute or suspicious bony abnormalities.

Review of the MIP images confirms the above findings.
IMPRESSION: 1. No evidence of acute abnormality. No evidence of pulmonary
emboli.
2. Prominent pulmonary arteries suggesting pulmonary arterial
hypertension.
3. Mild cardiomegaly.

## 2022-03-24 IMAGING — DX DG CHEST 2V
2 series · 2 of 2 positions shown · non-contrast
Comparison: 01/20/2020.

CLINICAL DATA: Chest pain.

EXAM:
CHEST - 2 VIEW

[w chest pa]
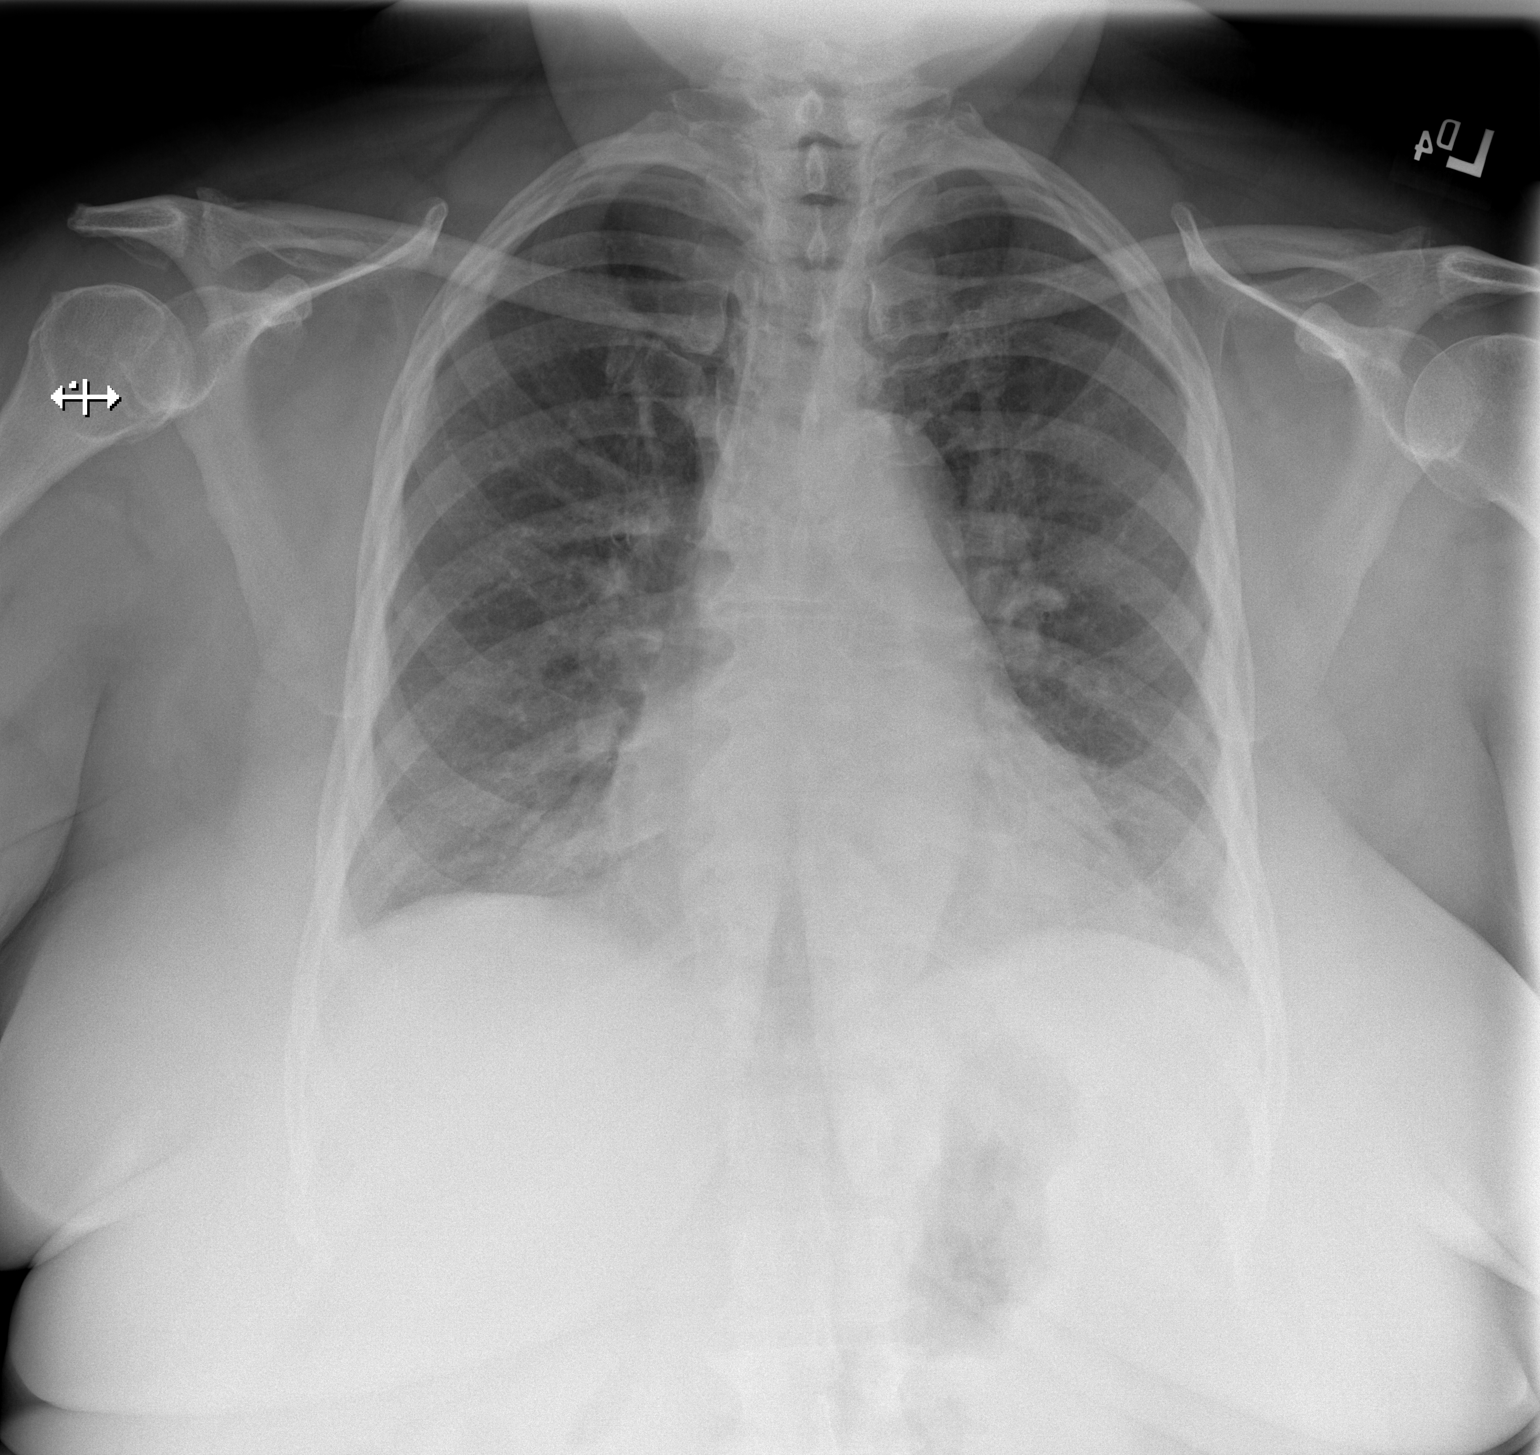

[w chest lat]
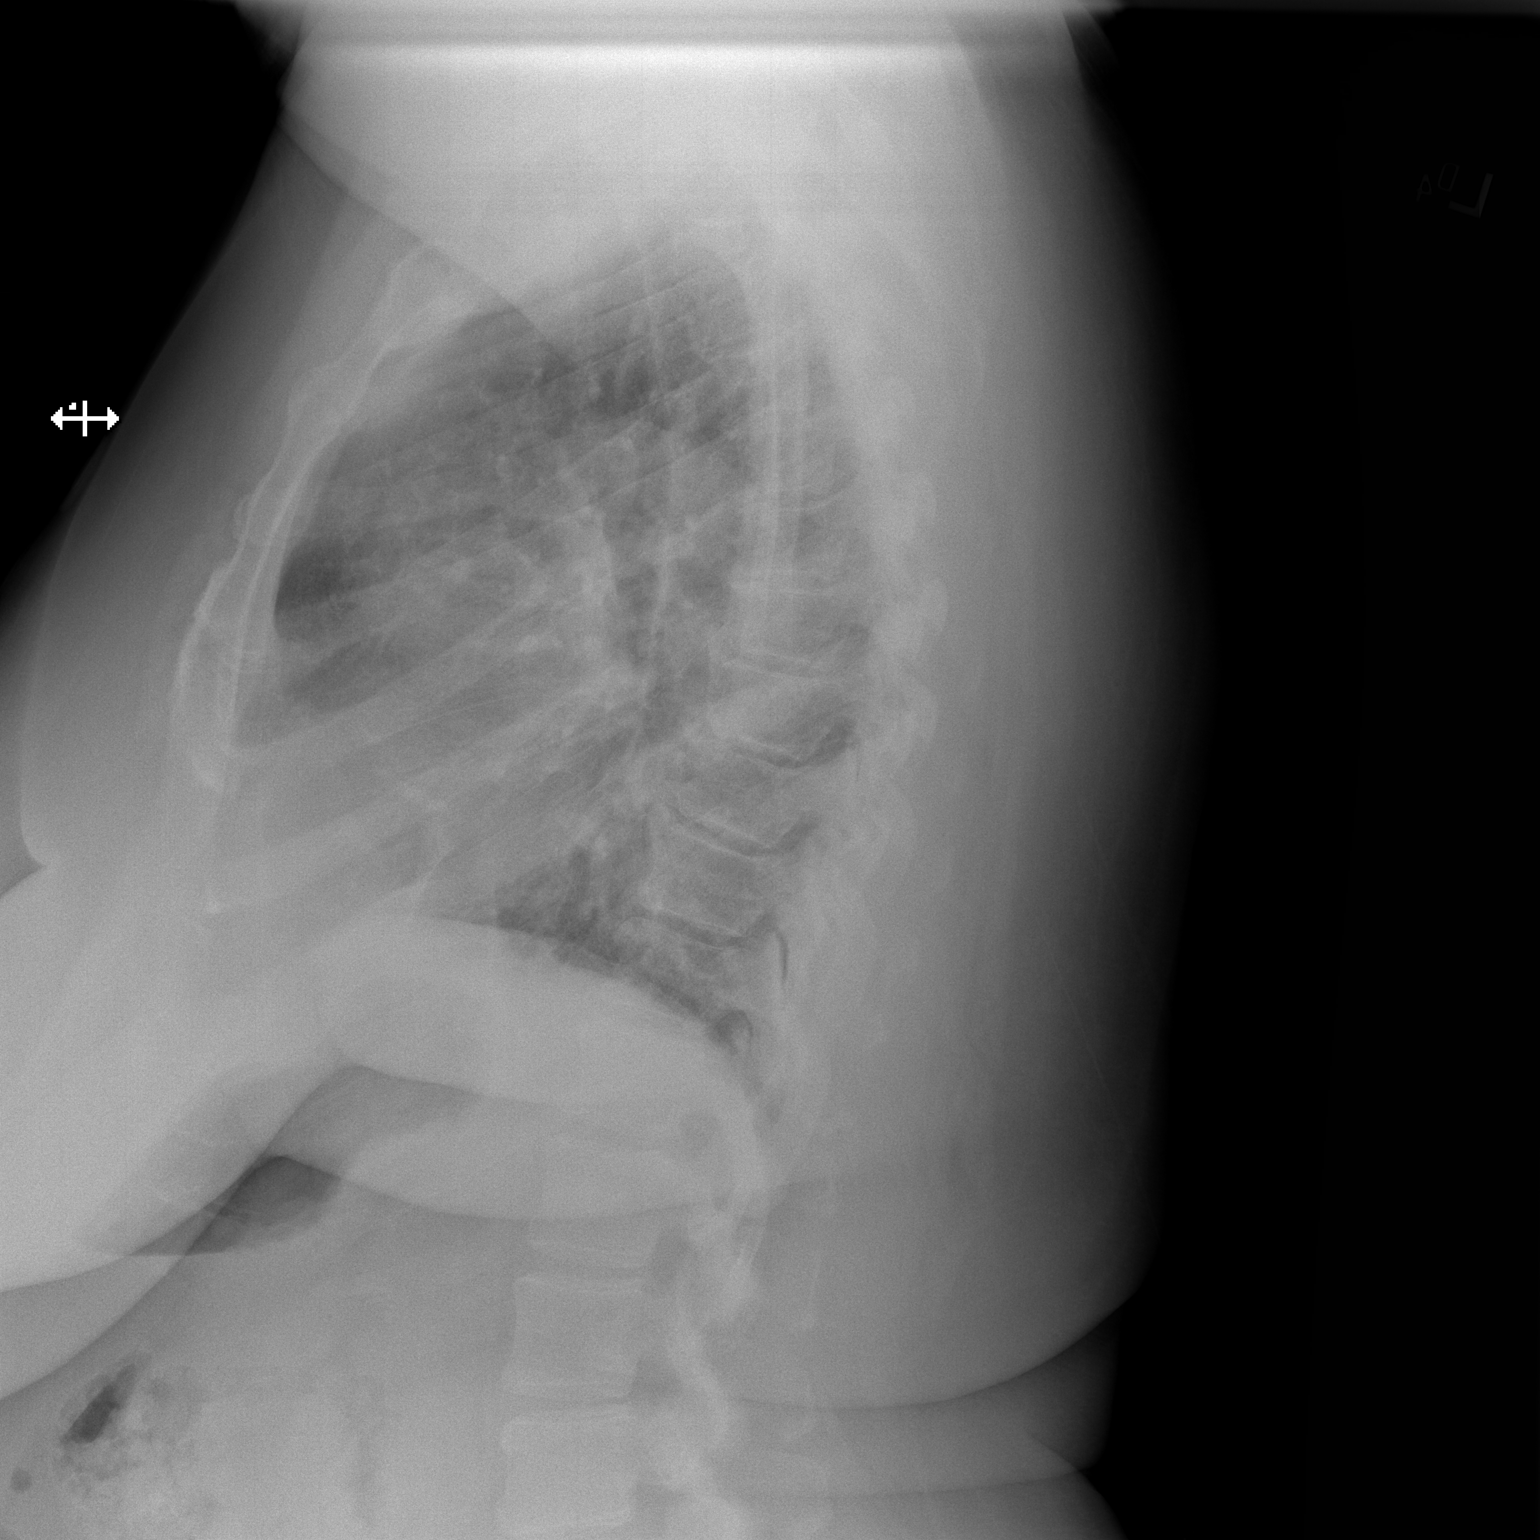

[2 of 2 positions shown; findings below may reference images not displayed]

FINDINGS: Heart size normal. Diffuse bilateral pulmonary infiltrates/edema. No
pleural effusion or pneumothorax. Degenerative change thoracic
spine.
IMPRESSION: Diffuse bilateral pulmonary infiltrates/edema.

## 2022-03-24 MED ORDER — PANTOPRAZOLE SODIUM 40 MG PO TBEC
40.0000 mg | DELAYED_RELEASE_TABLET | Freq: Every day | ORAL | Status: DC
  Administered 2022-03-24 – 2022-03-28 (×5): 40 mg via ORAL
  Filled 2022-03-24 (×5): qty 1

## 2022-03-24 MED ORDER — HYDROXYZINE HCL 10 MG PO TABS: 25.0000 mg | ORAL_TABLET | Freq: Three times a day (TID) | ORAL | Status: DC | PRN

## 2022-03-24 MED ORDER — ASPIRIN 81 MG PO CHEW
81.0000 mg | CHEWABLE_TABLET | Freq: Every day | ORAL | Status: DC
  Administered 2022-03-24 – 2022-03-28 (×5): 81 mg via ORAL
  Filled 2022-03-24 (×5): qty 1

## 2022-03-24 MED ORDER — METFORMIN HCL 500 MG PO TABS
500.0000 mg | ORAL_TABLET | Freq: Every day | ORAL | Status: DC
  Administered 2022-03-26 – 2022-03-28 (×3): 500 mg via ORAL
  Filled 2022-03-24 (×4): qty 1

## 2022-03-24 MED ORDER — SERTRALINE HCL 25 MG PO TABS
25.0000 mg | ORAL_TABLET | Freq: Every day | ORAL | Status: DC
  Administered 2022-03-24 – 2022-03-28 (×5): 25 mg via ORAL
  Filled 2022-03-24 (×6): qty 1

## 2022-03-24 MED ORDER — ONDANSETRON HCL 4 MG/2ML IJ SOLN
4.0000 mg | Freq: Four times a day (QID) | INTRAMUSCULAR | Status: DC | PRN
  Administered 2022-03-26 – 2022-03-27 (×2): 4 mg via INTRAVENOUS
  Filled 2022-03-24 (×2): qty 2

## 2022-03-24 MED ORDER — PROCHLORPERAZINE MALEATE 10 MG PO TABS: 10.0000 mg | ORAL_TABLET | Freq: Four times a day (QID) | ORAL | Status: DC | PRN

## 2022-03-24 MED ORDER — ASPIRIN 81 MG PO CHEW
324.0000 mg | CHEWABLE_TABLET | Freq: Once | ORAL | Status: AC
  Administered 2022-03-24: 324 mg via ORAL
  Filled 2022-03-24: qty 4

## 2022-03-24 MED ORDER — ENOXAPARIN SODIUM 40 MG/0.4ML IJ SOSY
40.0000 mg | PREFILLED_SYRINGE | Freq: Every day | INTRAMUSCULAR | Status: DC
  Administered 2022-03-24 – 2022-03-27 (×4): 40 mg via SUBCUTANEOUS
  Filled 2022-03-24 (×5): qty 0.4

## 2022-03-24 MED ORDER — FENTANYL CITRATE PF 50 MCG/ML IJ SOSY
50.0000 ug | PREFILLED_SYRINGE | Freq: Once | INTRAMUSCULAR | Status: AC
  Administered 2022-03-24: 50 ug via INTRAVENOUS
  Filled 2022-03-24: qty 1

## 2022-03-24 MED ORDER — ATORVASTATIN CALCIUM 40 MG PO TABS
40.0000 mg | ORAL_TABLET | Freq: Every day | ORAL | Status: DC
  Administered 2022-03-24 – 2022-03-27 (×4): 40 mg via ORAL
  Filled 2022-03-24 (×4): qty 1

## 2022-03-24 MED ORDER — INSULIN ASPART 100 UNIT/ML IJ SOLN
0.0000 [IU] | Freq: Three times a day (TID) | INTRAMUSCULAR | Status: DC
  Administered 2022-03-25 (×2): 2 [IU] via SUBCUTANEOUS
  Administered 2022-03-25: 3 [IU] via SUBCUTANEOUS
  Administered 2022-03-26: 2 [IU] via SUBCUTANEOUS
  Administered 2022-03-26: 3 [IU] via SUBCUTANEOUS
  Administered 2022-03-26 – 2022-03-27 (×2): 2 [IU] via SUBCUTANEOUS
  Administered 2022-03-27: 3 [IU] via SUBCUTANEOUS
  Administered 2022-03-28: 2 [IU] via SUBCUTANEOUS

## 2022-03-24 MED ORDER — POTASSIUM CHLORIDE 10 MEQ/100ML IV SOLN
10.0000 meq | INTRAVENOUS | Status: AC
  Administered 2022-03-24 (×3): 10 meq via INTRAVENOUS
  Filled 2022-03-24 (×3): qty 100

## 2022-03-24 MED ORDER — SODIUM CHLORIDE 0.9 % IV SOLN: INTRAVENOUS | Status: AC

## 2022-03-24 MED ORDER — SPIRONOLACTONE 12.5 MG HALF TABLET
12.5000 mg | ORAL_TABLET | Freq: Every day | ORAL | Status: DC
  Administered 2022-03-24 – 2022-03-28 (×5): 12.5 mg via ORAL
  Filled 2022-03-24 (×5): qty 1

## 2022-03-24 MED ORDER — FUROSEMIDE 10 MG/ML IJ SOLN
60.0000 mg | Freq: Two times a day (BID) | INTRAMUSCULAR | Status: DC
  Administered 2022-03-24 – 2022-03-27 (×6): 60 mg via INTRAVENOUS
  Filled 2022-03-24 (×6): qty 6

## 2022-03-24 MED ORDER — POTASSIUM CHLORIDE CRYS ER 20 MEQ PO TBCR
40.0000 meq | EXTENDED_RELEASE_TABLET | Freq: Once | ORAL | Status: AC
  Administered 2022-03-24: 40 meq via ORAL
  Filled 2022-03-24: qty 2

## 2022-03-24 MED ORDER — OXYCODONE HCL 5 MG PO TABS: 5.0000 mg | ORAL_TABLET | Freq: Four times a day (QID) | ORAL | Status: DC | PRN

## 2022-03-24 MED ORDER — BUSPIRONE HCL 15 MG PO TABS
30.0000 mg | ORAL_TABLET | Freq: Two times a day (BID) | ORAL | Status: DC
  Administered 2022-03-24 – 2022-03-28 (×9): 30 mg via ORAL
  Filled 2022-03-24: qty 3
  Filled 2022-03-24 (×8): qty 2

## 2022-03-24 MED ORDER — OXYCODONE HCL 5 MG PO TABS
5.0000 mg | ORAL_TABLET | Freq: Four times a day (QID) | ORAL | Status: DC | PRN
  Administered 2022-03-24 – 2022-03-25 (×2): 5 mg via ORAL
  Filled 2022-03-24 (×2): qty 1

## 2022-03-24 MED ORDER — CARVEDILOL 6.25 MG PO TABS
6.2500 mg | ORAL_TABLET | Freq: Two times a day (BID) | ORAL | Status: DC
  Administered 2022-03-24 – 2022-03-28 (×9): 6.25 mg via ORAL
  Filled 2022-03-24 (×6): qty 1
  Filled 2022-03-24: qty 2
  Filled 2022-03-24 (×2): qty 1

## 2022-03-24 MED ORDER — EMPAGLIFLOZIN 10 MG PO TABS
10.0000 mg | ORAL_TABLET | Freq: Every day | ORAL | Status: DC
  Administered 2022-03-24 – 2022-03-28 (×5): 10 mg via ORAL
  Filled 2022-03-24 (×5): qty 1

## 2022-03-24 MED ORDER — MAGNESIUM SULFATE 2 GM/50ML IV SOLN
2.0000 g | Freq: Once | INTRAVENOUS | Status: AC
  Administered 2022-03-24: 2 g via INTRAVENOUS
  Filled 2022-03-24: qty 50

## 2022-03-24 MED ORDER — LISINOPRIL 20 MG PO TABS: 20.0000 mg | ORAL_TABLET | Freq: Every day | ORAL | Status: DC

## 2022-03-24 MED ORDER — SODIUM CHLORIDE 0.9% FLUSH
10.0000 mL | Freq: Two times a day (BID) | INTRAVENOUS | Status: DC
  Administered 2022-03-24 – 2022-03-28 (×8): 10 mL

## 2022-03-24 MED ORDER — ISOSORBIDE DINITRATE 10 MG PO TABS
20.0000 mg | ORAL_TABLET | Freq: Every day | ORAL | Status: DC
  Administered 2022-03-25 – 2022-03-27 (×3): 20 mg via ORAL
  Filled 2022-03-24: qty 2
  Filled 2022-03-24: qty 1
  Filled 2022-03-24 (×2): qty 2

## 2022-03-24 MED ORDER — ONDANSETRON HCL 4 MG PO TABS: 8.0000 mg | ORAL_TABLET | Freq: Three times a day (TID) | ORAL | Status: DC | PRN

## 2022-03-24 MED ORDER — SODIUM CHLORIDE 0.9% FLUSH: 10.0000 mL | INTRAVENOUS | Status: DC | PRN

## 2022-03-24 MED ORDER — ALBUTEROL SULFATE (2.5 MG/3ML) 0.083% IN NEBU: 2.5000 mg | INHALATION_SOLUTION | Freq: Four times a day (QID) | RESPIRATORY_TRACT | Status: DC | PRN

## 2022-03-24 MED ORDER — ACETAMINOPHEN 325 MG PO TABS
650.0000 mg | ORAL_TABLET | Freq: Four times a day (QID) | ORAL | Status: DC | PRN
  Administered 2022-03-26: 650 mg via ORAL
  Filled 2022-03-24: qty 2

## 2022-03-24 MED ORDER — HYDROMORPHONE HCL 1 MG/ML IJ SOLN
0.5000 mg | INTRAMUSCULAR | Status: DC | PRN
  Administered 2022-03-24: 0.5 mg via INTRAVENOUS
  Filled 2022-03-24: qty 1

## 2022-03-24 MED ORDER — FUROSEMIDE 20 MG PO TABS: 60.0000 mg | ORAL_TABLET | Freq: Every day | ORAL | Status: DC

## 2022-03-24 MED ORDER — LOPERAMIDE HCL 2 MG PO CAPS: 2.0000 mg | ORAL_CAPSULE | Freq: Four times a day (QID) | ORAL | Status: DC | PRN

## 2022-03-24 MED ORDER — FENTANYL CITRATE PF 50 MCG/ML IJ SOSY
25.0000 ug | PREFILLED_SYRINGE | Freq: Once | INTRAMUSCULAR | Status: AC
  Administered 2022-03-24: 25 ug via INTRAVENOUS
  Filled 2022-03-24: qty 1

## 2022-03-24 NOTE — Assessment & Plan Note (Signed)
Patient was placed on insulin sliding scale for glucose cover and monitoring during her hospitalization.  Her glucose remained well controlled, her fasting glucose this am is 154 mg/dl.   Continue with metformin and added SGLT 2 inh.

## 2022-03-24 NOTE — Assessment & Plan Note (Signed)
Continue with buspirone, sertraline and hydroxyzine

## 2022-03-24 NOTE — Assessment & Plan Note (Signed)
Patient will follow up as outpatient  

## 2022-03-24 NOTE — ED Notes (Signed)
Pt placed on bedpan at this time.

## 2022-03-24 NOTE — Assessment & Plan Note (Addendum)
Echocardiogram with preserved LV systolic function, EF 60 to 65%, no wall motion abnormalities, RV systolic function preserved, no significant valvular disease.   Urine output is 3,968  ml  Systolic blood pressure is 126 to 141 mmHg.  Continue diuresis with furosemide, spironolactone and SGLT 2 inh.  Will continue today with IV furosemide and possible transition to po loop diuretic tomorrow.  Continue blood pressure monitoring.  Continue with carvedilol and isosorbide.  Plan to add ARB when more euvolemic.

## 2022-03-24 NOTE — Assessment & Plan Note (Addendum)
Patient with chronic back pain and now with worsening ambulatory dysfunction. Continue pain control with oral analgesics. PT and OT evaluation Out of bed to chair tid with meals.

## 2022-03-24 NOTE — H&P (Signed)
History and Physical  [name] UDJ:[id number] DOB: [birth year] DOA: 03/23/2022  Referring physician: Dr. Christy Gentles, EDP  PCP: Sonia Side., FNP  Outpatient Specialists: Hematology oncology Patient coming from: Home.  Chief Complaint: Chest pain.  HPI: [name] is a 61 y.o. female with medical history significant for breast cancer in remission, completed chemotherapy 03/03/2022, OSA on CPAP, type 2 diabetes, hypertension, hyperlipidemia, chronic anxiety/depression, GERD, severe morbid obesity, history of pulmonary embolism no longer on anticoagulation, who presented to [facility] ED with complaints of chest pain.  Started as left shoulder pain which radiated to the center of her chest.  Pressure-like, severe.  Associated with bilateral lower extremity edema for several days.  Denies subjective fevers.  Admits to chills with cold feet.  In the ED, vital signs are stable.  High-sensitivity troponin negative x 2.  No evidence of acute ischemia on twelve-lead EKG.  Due to her risk factors and heart score 5, EDP requested admission for observation.  ED Course: Tmax 98.4.  BP 114/64, pulse 81, respiratory 26, O2 saturation 99% on room air.  Lab studies significant for serum potassium 3.0, glucose 151, magnesium 1.6 hemoglobin 9.1.  Review of Systems: Review of systems as noted in the HPI. All other systems reviewed and are negative.   Past Medical History:  Diagnosis Date   Allergy    Anginal pain ([place])    a. NL cath in 2008;  b. Myoview 03/2011: dec uptake along mid anterior wall on stress imaging -> ? attenuation vs. ischemia, EF 65%;  c. 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 2008 -> coumadin x 6 mos.   Rheumatoid arthritis ([place])    Sleep apnea    on CPAP 02/2018   SOB (shortness of breath)    Thyroid disease    TIA (transient ischemic attack)    Urinary urgency    Vitamin D deficiency    Past Surgical History:  Procedure Laterality Date   ABDOMINAL HYSTERECTOMY  2005   APPENDECTOMY     BREAST LUMPECTOMY WITH RADIOACTIVE SEED AND SENTINEL LYMPH NODE BIOPSY Right 11/16/2021   Procedure: RIGHT BREAST RADIOACTIVE SEED LOCALIZED LUMPECTOMY WITH RIGHT AXILLARY SENTINEL LYMPH NODE BIOPSY;  Surgeon: Donnie Mesa, MD;  Location: [place];  Service: General;  Laterality: Right;  LMA & PEC BLOCK   CARDIAC CATHETERIZATION     Normal   CARDIAC CATHETERIZATION N/A 09/30/2014   Procedure: Left Heart Cath and  Coronary Angiography;  Surgeon: Sherren Mocha, MD;  Location: [place] [facility];  Service: Cardiovascular;  Laterality: N/A;   LAPAROSCOPIC APPENDECTOMY N/A 06/03/2012   Procedure: APPENDECTOMY LAPAROSCOPIC;  Surgeon: Stark Klein, MD;  Location: [place];  Service: General;  Laterality: N/A;   Left knee surgery  2008   LEG SURGERY     TONSILLECTOMY     TOTAL HIP ARTHROPLASTY Left 06/11/2018   Procedure: LEFT TOTAL HIP ARTHROPLASTY ANTERIOR APPROACH;  Surgeon: Meredith Pel, MD;  Location: [place];  Service: Orthopedics;  Laterality: Left;   TOTAL KNEE ARTHROPLASTY Left 08/22/2016   Procedure: TOTAL KNEE ARTHROPLASTY;  Surgeon: Meredith Pel, MD;  Location: [place];  Service: Orthopedics;  Laterality: Left;   TUBAL LIGATION  1989    Social  History:  reports that she quit smoking about 36 years ago. Her smoking use included cigarettes. She has a 7.50 pack-year smoking history. She has never used smokeless tobacco. She reports that she does not currently use drugs after having used the following drugs: Marijuana. She reports that she does not drink alcohol.   Allergies  Allergen Reactions   Hydrocodone-Acetaminophen Itching and Other (See Comments)    confusion   Penicillins Swelling and Other (See Comments)    Has patient had a PCN reaction causing immediate rash, facial/tongue/throat swelling, SOB or lightheadedness with hypotension: YES Has patient had a PCN reaction causing severe rash involving mucus membranes or skin necrosis: NO Has patient had a PCN reaction that required hospitalization NO Has patient had a PCN reaction occurring within the last 10 years: NO If all of the above answers are "NO", then may proceed with Cephalosporin use.   Shellfish Allergy Hives   Latex Rash   Sulfa Antibiotics Diarrhea, Itching and Rash    Family History  Problem Relation Age of Onset   Emphysema Mother    Arthritis Mother    Heart failure Mother        alive @ 17   Stroke Mother    Diabetes Mother    Hypertension Mother    Hyperlipidemia Mother    Depression Mother    Anxiety disorder Mother    Heart disease Mother    Alcoholism Mother    Depression Father    Heart disease Father        died @ 77's.   Stroke Father    Diabetes Father    Hyperlipidemia Father    Hypertension Father    Bipolar disorder Father    Sleep apnea Father    Alcoholism Father    Drug abuse Father    Sudden death Father    Diabetes Sister    Asthma Brother    Breast cancer Maternal Aunt 34 - 30   Colon cancer Maternal Grandfather        dx after 59   Heart disease Paternal Grandfather    Breast cancer Cousin        dx 29s   Esophageal cancer Neg Hx    Stomach cancer Neg Hx    Rectal cancer Neg Hx       Prior to Admission  medications   Medication Sig Start Date End Date Taking? Authorizing Provider  ACCU-CHEK GUIDE test strip  10/28/21   [provider]  albuterol (PROVENTIL HFA;VENTOLIN HFA) 108 (90 Base) MCG/ACT inhaler Inhale 2 puffs into the lungs every 6 (six) hours as needed for wheezing or shortness of breath.    [provider]  aspirin 81 MG chewable  tablet Chew 81 mg by mouth daily.    [provider]  atorvastatin (LIPITOR) 40 MG tablet Take 40 mg by mouth at bedtime.     [provider]  busPIRone (BUSPAR) 30 MG tablet Take 30 mg by mouth 2 (two) times daily. 12/18/19   [provider]  carvedilol (COREG) 6.25 MG tablet Take 1 tablet (6.25 mg total) by mouth 2 (two) times daily. 09/06/21   Josue Hector, MD  dexamethasone (DECADRON) 4 MG tablet Take 2 tabs by mouth 2 times daily starting day before chemo. Then take 2 tabs daily for 2 days starting day after chemo. Take with food. Patient not taking: Reported on 03/20/2022 12/20/21   [name] Pike, MD  diclofenac (VOLTAREN) 75 MG EC tablet Take 75 mg by mouth 2 (two) times daily. 12/02/21   [provider]  diclofenac Sodium (VOLTAREN) 1 % GEL Apply 2 g topically 4 (four) times daily as needed (joint/muscle pain). 09/20/19   [provider]  EPINEPHrine 0.3 mg/0.3 mL IJ SOAJ injection Inject 0.3 mg into the muscle as needed for anaphylaxis. Follow package instructions as needed for severe allergy or anaphylactic reaction. 11/06/21   Carrie Mew, MD  furosemide (LASIX) 40 MG tablet Take 60 mg by mouth daily.    Rai, Vernelle Emerald, MD  hydrOXYzine (VISTARIL) 25 MG capsule Take 25 mg by mouth 3 (three) times daily as needed for itching.    [provider]  isosorbide dinitrate (ISORDIL) 20 MG tablet Take 20 mg by mouth daily.    [provider]  lidocaine (LIDODERM) 5 % Place 1 patch onto the skin daily as needed for pain. 02/11/21   [provider]  lidocaine-prilocaine  (EMLA) cream Apply to affected area once Patient not taking: Reported on 03/20/2022 12/20/21   [name] Pike, MD  lisinopril (PRINIVIL,ZESTRIL) 20 MG tablet Take 20 mg by mouth daily.  09/21/14   [provider]  loperamide (IMODIUM) 2 MG capsule Take 1 capsule (2 mg total) by mouth 4 (four) times daily as needed for diarrhea or loose stools. 01/07/22   Long, Wonda Olds, MD  metFORMIN (GLUCOPHAGE) 500 MG tablet Take 1 tablet (500 mg total) by mouth daily with breakfast. 12/19/21   Laqueta Linden, MD  MYRBETRIQ 25 MG TB24 tablet Take 1 tablet (25 mg total) by mouth daily. Patient not taking: Reported on 03/20/2022 03/13/19   Dennard Nip D, MD  nystatin (MYCOSTATIN/NYSTOP) powder Apply 1 application topically 3 (three) times daily as needed for rash.    [provider]  ondansetron (ZOFRAN) 8 MG tablet Take 1 tablet (8 mg total) by mouth every 8 (eight) hours as needed for nausea or vomiting. Start on the third day after chemotherapy. 12/20/21   [name] Pike, MD  ondansetron (ZOFRAN-ODT) 4 MG disintegrating tablet Take 1 tablet (4 mg total) by mouth every 8 (eight) hours as needed for nausea or vomiting. Patient not taking: Reported on 03/20/2022 01/07/22   Long, Wonda Olds, MD  oxyCODONE (OXY IR/ROXICODONE) 5 MG immediate release tablet Take 1 tablet (5 mg total) by mouth every 6 (six) hours as needed for severe pain. 11/16/21   Donnie Mesa, MD  pantoprazole (PROTONIX) 40 MG tablet Take 1 tablet (40 mg total) by mouth 2 (two) times daily. Patient taking differently: Take 40 mg by mouth daily. 03/09/21   Domenic Polite, MD  potassium chloride (KLOR-CON M10) 10 MEQ tablet Take 1 tablet (10 mEq total) by mouth 2 (two) times daily. 05/27/18  Jearld Lesch A, DO  prochlorperazine (COMPAZINE) 10 MG tablet Take 1 tablet (10 mg total) by mouth every 6 (six) hours as needed for nausea or vomiting. 12/20/21   [name] Pike, MD  Semaglutide,0.25 or 0.'5MG'$ /DOS, (OZEMPIC, 0.25 OR 0.5  MG/DOSE,) 2 MG/1.5ML SOPN Inject 0.5 mg into the skin once a week. Patient not taking: Reported on 03/20/2022 01/11/22   Laqueta Linden, MD  sertraline (ZOLOFT) 25 MG tablet Take 1 tablet (25 mg total) by mouth daily. 01/11/22   Laqueta Linden, MD  valACYclovir (VALTREX) 1000 MG tablet Take 1 tablet (1,000 mg total) by mouth 2 (two) times daily. Patient not taking: Reported on 03/20/2022 01/09/22   Nicholas Lose, MD  Vitamin D, Ergocalciferol, (DRISDOL) 1.25 MG (50000 UNIT) CAPS capsule Take 1 capsule (50,000 Units total) by mouth every 7 (seven) days. 01/11/22   Laqueta Linden, MD    Physical Exam: BP (!) 110/58   Pulse 77   Temp 97.8 F (36.6 C) (Oral)   Resp 20   Ht '4\' 11"'$  (1.499 m)   Wt 95.3 kg   SpO2 97%   BMI 42.41 kg/m   General: 61 y.o. year-old female well developed well nourished in no acute distress.  Alert and oriented x3. Cardiovascular: Regular rate and rhythm with no rubs or gallops.  No thyromegaly or JVD noted.  No lower extremity edema. 2/4 pulses in all 4 extremities. Respiratory: Clear to auscultation with no wheezes or rales. Good inspiratory effort. Abdomen: Soft nontender nondistended with normal bowel sounds x4 quadrants. Muskuloskeletal: No cyanosis, clubbing or edema noted bilaterally Neuro: CN II-XII intact, strength, sensation, reflexes Skin: No ulcerative lesions noted or rashes Psychiatry: Judgement and insight appear normal. Mood is appropriate for condition and setting          Labs on Admission:  Basic Metabolic Panel: Recent Labs  Lab 03/23/22 1928  NA 145  K 3.1*  CL 106  CO2 26  GLUCOSE 188*  BUN 13  CREATININE 0.91  CALCIUM 9.1   Liver Function Tests: Recent Labs  Lab 03/23/22 1928  AST 23  ALT 23  ALKPHOS 52  BILITOT 0.4  PROT 5.3*  ALBUMIN 3.2*   No results for input(s): "LIPASE", "AMYLASE" in the last 168 hours. No results for input(s): "AMMONIA" in the last 168 hours. CBC: Recent Labs  Lab  03/24/22 0034  WBC 4.3  NEUTROABS 1.6*  HGB 9.1*  HCT 29.5*  MCV 96.4  PLT 184   Cardiac Enzymes: No results for input(s): "CKTOTAL", "CKMB", "CKMBINDEX", "TROPONINI" in the last 168 hours.  BNP (last 3 results) Recent Labs    04/22/21 2331 12/04/21 1737 03/23/22 1930  BNP 50.0 109.2* 22.9    ProBNP (last 3 results) No results for input(s): "PROBNP" in the last 8760 hours.  CBG: Recent Labs  Lab 03/23/22 0824  GLUCAP 176*    Radiological Exams on Admission: CT Angio Chest PE W and/or Wo Contrast  Result Date: 03/23/2022 CLINICAL DATA:  Chest pain, shortness of breath and left upper extremity pain. Ongoing worsening symptoms for a few weeks. Most recently the patient has a history of right breast cancer with lumpectomy and radioactive seed implant 11/16/2021. EXAM: CT ANGIOGRAPHY CHEST WITH CONTRAST TECHNIQUE: Multidetector CT imaging of the chest was performed using the standard protocol during bolus administration of intravenous contrast. Multiplanar CT image reconstructions and MIPs were obtained to evaluate the vascular anatomy. RADIATION DOSE REDUCTION: This exam was performed according to the departmental dose-optimization program which includes  automated exposure control, adjustment of the mA and/or kV according to patient size and/or use of iterative reconstruction technique. CONTRAST:  70m OMNIPAQUE IOHEXOL 350 MG/ML SOLN COMPARISON:  PA Lat chest 01/06/2022, PA Lat chest 12/06/2021. Numerous prior CTs. The 2 most recent are CTA chest 11/29/2021, CTA chest 09/27/2021. FINDINGS: Cardiovascular: Mildly prominent pulmonary trunk 3.4 cm chronically noted indicating arterial hypertension. No embolus is seen through the segmental arteries. The subsegmental arterial bed is largely unopacified due to bolus timing and not evaluated. There is mild cardiomegaly with a left chamber predominance. Small chronic pericardial fluid collects to the right, unchanged. There is mild aortic  atherosclerosis without aneurysm, stenosis or dissection. The great vessels are clear. Pulmonary veins are decompressed. There is no visible coronary calcific plaque. Mediastinum/Nodes: Heterogeneous enlarged right thyroid lobe with 4 cm lower pole mass. This has been previously biopsied. This has been most recently evaluated on ultrasound 06/09/2021. There is stable 1.6 x 3 cm well-circumscribed ovoid prevascular soft tissue mass which has been noted on multiple prior studies and which is believed to be a substernal extension of the above-described right lower pole thyroid mass. No intrathoracic or axillary adenopathy is seen. No tracheal lesion. There is no significant esophageal thickening. There is a right axillary fluid collection measuring 3.3 x 1.7 cm on 5:38 which was previously about 3 times this size. Surgical clips are again noted in the upper inner right breast with interval resolution of the adjacent fluid collection noted on the last CT. Lungs/Pleura: Some respiratory motion artifact is seen on exam. There is mild dependent atelectasis. No confluent infiltrate is seen. There no appreciable pulmonary nodule visible through the motion artifact. There is no pleural effusion, thickening or pneumothorax. Upper Abdomen: No acute abnormality. Musculoskeletal: Mild thoracic kyphodextroscoliosis and degenerative changes of the spine. No destructive or lytic lesions are seen in the thorax. The ribcage is intact. Review of the MIP images confirms the above findings. IMPRESSION: 1. No arterial embolus is seen through the segmental arteries. The subsegmental arterial bed is largely unopacified due to bolus timing or obscured by breathing motion and not evaluated. 2. Cardiomegaly with left chamber predominance. No pulmonary edema or pleural effusion. 3. Aortic atherosclerosis. 4. 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 5. Stable 1.6 x 3 cm prevascular soft tissue mass which is believed to be a  substernal extension of the right lower pole thyroid mass. 6. 3.3 x 1.7 cm right axillary fluid collection which was previously much larger. 7. Surgical clips in the upper inner right breast with interval resolution of the adjacent fluid collection noted on the last CT. 8. Mild kyphodextroscoliosis and degenerative change of the spine. Aortic Atherosclerosis (ICD10-I70.0). Electronically Signed   By: KTelford NabM.D.   On: 03/23/2022 23:31    EKG: I independently viewed the EKG done and my findings are as followed: Sinus rhythm first-degree AV block.  Incomplete left bundle branch block.  Assessment/Plan Present on Admission:  Chest pain  Active Problems:   Chest pain  Chest pain, rule out ACS 2 sets of high-sensitivity troponin are negative No evidence of acute ischemia on 12-lead EKG Obtain 2D echo  Hypokalemia Hypomagnesemia Serum potassium 3.0 Serum magnesium 1.6 Repleted intravenously  Type 2 diabetes with hyperglycemia Obtain hemoglobin A1c Insulin sliding scale.  Anemia of chronic disease Hemoglobin 9.1, baseline hemoglobin 10.9. Monitor H&H  Chronic anxiety/depression Resume home regimen  Severe morbid obesity Recommend weight loss outpatient with regular physical activity and healthy dieting.   DVT  prophylaxis: Subcu Lovenox daily  Code Status: Full code  Family Communication: None at bedside  Disposition Plan: Admitted to telemetry cardiac unit  Consults called: None  Admission status: Observation status.   Status is: Observation    Kayleen Memos MD [facility] Pager [phone]  If 7PM-7AM, please contact night-coverage [email or web address] Password [facility]  03/24/2022, 3:19 AM

## 2022-03-24 NOTE — Progress Notes (Signed)
ED reported to RN that pt having pain and swelling around PIV. RN observed erythema at the IV site and swelling upon pt's arrival. RN removed PIV and applied warm compress. Will continue to monitor.

## 2022-03-24 NOTE — Progress Notes (Signed)
Patient placed on CPAP at this time.  

## 2022-03-24 NOTE — ED Notes (Signed)
Lab at bedside at this time.  

## 2022-03-24 NOTE — Assessment & Plan Note (Signed)
Renal function with serum cr at 1,0 with K at 3,5 and serum bicarbonate at 28 Continue diuresis with furosemide, spironolactone and SGLT 2 inh.

## 2022-03-24 NOTE — Hospital Course (Addendum)
[name] was admitted to the hospital with the working diagnosis of chest pain in the setting of volume overload.   61 yo female with the past medical history of breast cancer on remission, completed chemotherapy 02/2022, T2DM, hypertension, dyslipidemia, GERD, history of pulmonary embolism, depression and obesity class 3 who presented with chest pain. Reported left shoulder pain that radiated to her chest, pressure like, severe in intensity, prompting her to come to the ED. Her chest pain occurred while she was in bed. She had lower extremity edema for several weeks, having difficulty ambulating and using a walker for mobility. On her initial physical examination her blood pressure was 114/64, HR 81, RR 26 and 02 saturation 98% on room air, heart with S1 and S2 present and rhythmic with no gallops, rubs or murmurs, respiratory with no rales, rhonchi or wheezing, abdomen with no distention, no lower extremity edema.  NA 141, K 3,0, CL 109, bicarbonate 25, glucose 151 bun 12 cr 0,85  Mg 1.6  High sensitive troponin 9 and 8  Wbc 4,3 hgb 9,1 plt 184  Sars covid 19 negative   CT chest with no arterial thrombus, bilateral faint ground glass opacities.  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 Stable 1.6 tp 3 cm pre-vascular soft tissue mass likely extension of thyroid mass. 3.3 x 1.,7 cm right axillary fluid collection which was previously larger.  Surgical clips in the upper inner right breast.   EKG 82 bpm, normal axis, 1st degree AV block, sinus rhythm with 1 wave lead I and AvL, poor R R wave progression, with no significant ST segment or T wave changes.   12/02 patient placed on furosemide for diuresis with improvement in volume status but not back to baseline.  12/03 responding well to diuresis.  12/04 volume status has improved, pending renal function to be more stable.

## 2022-03-24 NOTE — ED Notes (Signed)
Pt continues to c/o pain. Sent provider a message reference same. Pt is also asking for something to eat and drink. Message sent to provider as well

## 2022-03-24 NOTE — ED Notes (Signed)
Ramiro Harvest daughter [phone] call with any changes or updates

## 2022-03-24 NOTE — Progress Notes (Signed)
Progress Note   Patient: [name] IZT:[id number] DOB: 04-01-1961 DOA: 03/23/2022     0 DOS: the patient was seen and examined on 03/24/2022   Brief hospital course: [name] was admitted to the hospital with the working diagnosis of chest pain in the setting of volume overload.   61 yo female with the past medical history of breast cancer on remission, completed chemotherapy 02/2022, T2DM, hypertension, dyslipidemia, GERD, history of pulmonary embolism, depression and obesity class 3 who presented with chest pain. Reported left shoulder pain that radiated to her chest, pressure like, severe in intensity, prompting her to come to the ED. Her chest pain occurred while she was in bed. She had lower extremity edema for several weeks, having difficulty ambulating and using a walker for mobility. On her initial physical examination her blood pressure was 114/64, HR 81, RR 26 and 02 saturation 98% on room air, heart with S1 and S2 present and rhythmic with no gallops, rubs or murmurs, respiratory with no rales, rhonchi or wheezing, abdomen with no distention, no lower extremity edema.  NA 141, K 3,0, CL 109, bicarbonate 25, glucose 151 bun 12 cr 0,85  Mg 1.6  High sensitive troponin 9 and 8  Wbc 4,3 hgb 9,1 plt 184  Sars covid 19 negative   CT chest with no arterial thrombus, bilateral faint ground glass opacities.  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 Stable 1.6 tp 3 cm pre-vascular soft tissue mass likely extension of thyroid mass. 3.3 x 1.,7 cm right axillary fluid collection which was previously larger.  Surgical clips in the upper inner right breast.   EKG 82 bpm, normal axis, 1st degree AV block, sinus rhythm with 1 wave lead I and AvL, poor R R wave progression, with no significant ST segment or T wave changes.   Assessment and Plan: Acute on chronic diastolic CHF (congestive heart failure) (HCC) Echocardiogram with preserved LV systolic function, EF 60  to 65%, no wall motion abnormalities, RV systolic function preserved, no significant valvular disease.   Patient with worsening edema lower extremities for several weeks, now with difficulty ambulating. Chest pain seems to be non cardiac in origin, no acute coronary syndrome. Patient on furosemide at home.   Plan to start patient on IV diuresis with furosemide to target negative fluid balance.  Add spironolactone and SGLT 2 inh.  Continue blood pressure monitoring.  Continue with carvedilol and isosorbide.   HTN (hypertension) Blood pressure has been stable, will continue diuresis with furosemide and add spironolactone along with SGLT 2 inh If blood pressure stable will add ARB. Continue blood pressure monitoring   Hypokalemia Renal function stable, continue K correction with Kcl Continue diuresis and follow up renal function and electrolytes in am.    Type 2 diabetes mellitus with hyperlipidemia (HCC) Continue insulin sliding scale for glucose cover and monitoring Admission glucose is 151, advance diet to cardiac diabetic prudent.   DEGENERATION, LUMBAR/LUMBOSACRAL DISC Patient with chronic back pain and now with worsening ambulatory dysfunction. Continue pain control with oral analgesics. PT and OT evaluation Out of bed to chair tid with meals.   Depression Continue with buspirone, sertraline and hydroxyzine   Class 3 severe obesity with serious comorbidity and body mass index (BMI) of 40.0 to 44.9 in adult (HCC) Calculated BMI is 42.4  Malignant neoplasm of upper-outer quadrant of female breast [facility]) Patient will follow up as outpatient.         Subjective: Patient continue to have chest  pain and lower extremity pain, worsening edema and dyspnea, not able to ambulate, not back to her baseline   Physical Exam: Vitals:   03/24/22 1200 03/24/22 1215 03/24/22 1230 03/24/22 1300  BP: 119/63 115/68 121/69 114/66  Pulse: 77 74 77 81  Resp: '17 17 17 18  '$ Temp:       TempSrc:      SpO2: 96% 98% 95% 95%  Weight:      Height:       Neurology awake and alert ENT with mild pallor Cardiovascular with S1 and S2 present and rhythmic with no gallops, rubs or murmurs Respiratory with scattered rales with no wheezing Abdomen with no distention, protuberant Positive lower extremity edema pitting +++  Reproducible left shoulder and precordial chest pain to palpation and left shoulder movement  Data Reviewed:    Family Communication: no family at the bedside   Disposition: Status is: Observation The patient remains OBS appropriate and will d/c before 2 midnights.  Planned Discharge Destination: Home and Home with Home Health    Author: Tawni Millers, MD 03/24/2022 2:25 PM  For on call review [email or web address].

## 2022-03-24 NOTE — ED Provider Notes (Signed)
[facility] EMERGENCY DEPARTMENT Provider Note   CSN: [id number] Arrival date & time: 03/23/22  1851     History  Chief Complaint  Patient presents with   Chest Pain    [name] is a 61 y.o. female.  The history is provided by the patient and a relative.  Chest Pain Patient with history of CHF, previous PE no longer on anticoagulation, history of breast cancer with recent chemotherapy treatment, presents with chest pain.  She reports approximately 3 PM on November 30 she began having left arm pain and left chest pressure.  She reports shortness of breath.  She reports the symptoms are ongoing.  She also reports increasing lower extremity edema for several weeks.  No fevers or vomiting.  No significant cough.  She does report chills No falls or trauma No focal weakness.  No abdominal pain.   Past Medical History:  Diagnosis Date   Allergy    Anginal pain ([place])    a. NL cath in 2008;  b. Myoview 03/2011: dec uptake along mid anterior wall on stress imaging -> ? attenuation vs. ischemia, EF 65%;  c. 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 2008 -> coumadin x 6 mos.   Rheumatoid arthritis ([place])    Sleep apnea    on CPAP 02/2018   SOB (shortness of breath)     Thyroid disease    TIA (transient ischemic attack)    Urinary urgency    Vitamin D deficiency     Home Medications Prior to Admission medications   Medication Sig Start Date End Date Taking? Authorizing Provider  ACCU-CHEK GUIDE test strip  10/28/21   [provider]  albuterol (PROVENTIL HFA;VENTOLIN HFA) 108 (90 Base) MCG/ACT inhaler Inhale 2 puffs into the lungs every 6 (six) hours as needed for wheezing or shortness of breath.    [provider]  aspirin 81 MG chewable tablet Chew 81 mg by mouth daily.    [provider]  atorvastatin (LIPITOR) 40 MG tablet Take 40 mg by mouth at bedtime.     [provider]  busPIRone (BUSPAR) 30 MG tablet Take 30 mg by mouth 2 (two) times daily. 12/18/19   [provider]  carvedilol (COREG) 6.25 MG tablet Take 1 tablet (6.25 mg total) by mouth 2 (two) times daily. 09/06/21   Josue Hector, MD  dexamethasone (DECADRON) 4 MG tablet Take 2 tabs by mouth 2 times  daily starting day before chemo. Then take 2 tabs daily for 2 days starting day after chemo. Take with food. Patient not taking: Reported on 03/20/2022 12/20/21   [name] Pike, MD  diclofenac (VOLTAREN) 75 MG EC tablet Take 75 mg by mouth 2 (two) times daily. 12/02/21   [provider]  diclofenac Sodium (VOLTAREN) 1 % GEL Apply 2 g topically 4 (four) times daily as needed (joint/muscle pain). 09/20/19   [provider]  EPINEPHrine 0.3 mg/0.3 mL IJ SOAJ injection Inject 0.3 mg into the muscle as needed for anaphylaxis. Follow package instructions as needed for severe allergy or anaphylactic reaction. 11/06/21   Carrie Mew, MD  furosemide (LASIX) 40 MG tablet Take 60 mg by mouth daily.    Rai, Vernelle Emerald, MD  hydrOXYzine (VISTARIL) 25 MG capsule Take 25 mg by mouth 3 (three) times daily as needed for itching.    [provider]  isosorbide dinitrate (ISORDIL) 20 MG tablet Take 20 mg by mouth daily.    [provider]   lidocaine (LIDODERM) 5 % Place 1 patch onto the skin daily as needed for pain. 02/11/21   [provider]  lidocaine-prilocaine (EMLA) cream Apply to affected area once Patient not taking: Reported on 03/20/2022 12/20/21   [name] Pike, MD  lisinopril (PRINIVIL,ZESTRIL) 20 MG tablet Take 20 mg by mouth daily.  09/21/14   [provider]  loperamide (IMODIUM) 2 MG capsule Take 1 capsule (2 mg total) by mouth 4 (four) times daily as needed for diarrhea or loose stools. 01/07/22   Long, Wonda Olds, MD  metFORMIN (GLUCOPHAGE) 500 MG tablet Take 1 tablet (500 mg total) by mouth daily with breakfast. 12/19/21   Laqueta Linden, MD  MYRBETRIQ 25 MG TB24 tablet Take 1 tablet (25 mg total) by mouth daily. Patient not taking: Reported on 03/20/2022 03/13/19   Dennard Nip D, MD  nystatin (MYCOSTATIN/NYSTOP) powder Apply 1 application topically 3 (three) times daily as needed for rash.    [provider]  ondansetron (ZOFRAN) 8 MG tablet Take 1 tablet (8 mg total) by mouth every 8 (eight) hours as needed for nausea or vomiting. Start on the third day after chemotherapy. 12/20/21   [name] Pike, MD  ondansetron (ZOFRAN-ODT) 4 MG disintegrating tablet Take 1 tablet (4 mg total) by mouth every 8 (eight) hours as needed for nausea or vomiting. Patient not taking: Reported on 03/20/2022 01/07/22   Long, Wonda Olds, MD  oxyCODONE (OXY IR/ROXICODONE) 5 MG immediate release tablet Take 1 tablet (5 mg total) by mouth every 6 (six) hours as needed for severe pain. 11/16/21   Donnie Mesa, MD  pantoprazole (PROTONIX) 40 MG tablet Take 1 tablet (40 mg total) by mouth 2 (two) times daily. Patient taking differently: Take 40 mg by mouth daily. 03/09/21   Domenic Polite, MD  potassium chloride (KLOR-CON M10) 10 MEQ tablet Take 1 tablet (10 mEq total) by mouth 2 (two) times daily. 05/27/18   Jearld Lesch A, DO  prochlorperazine (COMPAZINE) 10 MG tablet Take 1 tablet (10 mg total) by mouth every  6 (six) hours as needed for nausea or vomiting. 12/20/21   [name] Pike, MD  Semaglutide,0.25 or 0.'5MG'$ /DOS, (OZEMPIC, 0.25 OR 0.5 MG/DOSE,) 2 MG/1.5ML SOPN Inject 0.5 mg into the skin once a week. Patient not taking: Reported on 03/20/2022 01/11/22   Laqueta Linden, MD  sertraline (ZOLOFT) 25 MG tablet Take 1 tablet (25 mg total) by mouth daily. 01/11/22   Laqueta Linden, MD  valACYclovir (VALTREX) 1000 MG tablet Take 1 tablet (1,000 mg total) by mouth 2 (two) times daily. Patient not taking: Reported on 03/20/2022 01/09/22   Nicholas Lose, MD  Vitamin D, Ergocalciferol, (DRISDOL) 1.25 MG (50000 UNIT) CAPS capsule Take 1 capsule (50,000 Units total) by mouth every 7 (seven) days. 01/11/22   Laqueta Linden, MD      Allergies    Hydrocodone-acetaminophen, Penicillins, Shellfish allergy, Latex, and Sulfa antibiotics    Review of Systems   Review of Systems  Cardiovascular:  Positive for chest pain.    Physical Exam Updated Vital Signs BP 111/60   Pulse 82   Temp 98 F (36.7 C)   Resp 14   Ht 1.499 m ('4\' 11"'$ )   Wt 95.3 kg   SpO2 99%   BMI 42.41 kg/m  Physical Exam CONSTITUTIONAL: Chronically ill-appearing HEAD: Normocephalic/atraumatic EYES: EOMI/PERRL ENMT: Mucous membranes moist NECK: supple no meningeal signs SPINE/BACK:entire spine nontender CV: S1/S2 noted, no murmurs/rubs/gallops noted LUNGS: Lungs are clear to auscultation bilaterally, no apparent distress Chest-no bruising or tenderness ABDOMEN: soft, nontender, no rebound or guarding, bowel sounds noted throughout abdomen GU:no cva tenderness NEURO: Pt is awake/alert/appropriate, moves all extremitiesx4.  No facial droop.   EXTREMITIES: pulses normal/equal, full ROM, minimal edema noted to bilateral lower extremities.  Distal pulses equal and intact.  Mild tenderness noted left upper extremity there is no deformities.  Full range of motion of left upper extremity SKIN: warm, color normal PSYCH: no  abnormalities of mood noted, alert and oriented to situation  ED Results / Procedures / Treatments   Labs (all labs ordered are listed, but only abnormal results are displayed) Labs Reviewed  COMPREHENSIVE METABOLIC PANEL - Abnormal; Notable for the following components:      Result Value   Potassium 3.1 (*)    Glucose, Bld 188 (*)    Total Protein 5.3 (*)    Albumin 3.2 (*)    All other components within normal limits  CBC WITH DIFFERENTIAL/PLATELET - Abnormal; Notable for the following components:   RBC 3.06 (*)    Hemoglobin 9.1 (*)    HCT 29.5 (*)    RDW 17.7 (*)    Neutro Abs 1.6 (*)    All other components within normal limits  RESP PANEL BY RT-PCR (FLU A&B, COVID) ARPGX2  BRAIN NATRIURETIC PEPTIDE  CBC WITH DIFFERENTIAL/PLATELET  TROPONIN I (HIGH SENSITIVITY)    EKG EKG Interpretation  Date/Time:  Thursday March 23 2022 19:01:51 EST Ventricular Rate:  85 PR Interval:  200 QRS Duration: 114 QT Interval:  374 QTC Calculation: 445 R Axis:   65 Text Interpretation: Normal sinus rhythm Incomplete left bundle branch block Nonspecific ST and T wave abnormality Abnormal ECG Interpretation limited secondary to artifact Confirmed by Ripley Fraise [phone]) on 03/23/2022 11:58:29 PM   EKG Interpretation  Date/Time:  Friday March 24 2022 02:06:03 EST Ventricular Rate:  82 PR Interval:  230 QRS Duration: 112 QT Interval:  388 QTC Calculation: 453 R Axis:   63 Text Interpretation: Sinus rhythm with 1st degree A-V block Incomplete left bundle branch block Confirmed by Ripley Fraise [phone]) on 03/24/2022 2:09:18 AM         Radiology CT Angio Chest PE W and/or Wo Contrast  Result Date: 03/23/2022 CLINICAL DATA:  Chest pain, shortness of breath and left upper extremity pain. Ongoing worsening symptoms for a few weeks. Most recently the patient has a history of right breast cancer with lumpectomy and radioactive seed implant 11/16/2021. EXAM:  CT ANGIOGRAPHY  CHEST WITH CONTRAST TECHNIQUE: Multidetector CT imaging of the chest was performed using the standard protocol during bolus administration of intravenous contrast. Multiplanar CT image reconstructions and MIPs were obtained to evaluate the vascular anatomy. RADIATION DOSE REDUCTION: This exam was performed according to the departmental dose-optimization program which includes automated exposure control, adjustment of the mA and/or kV according to patient size and/or use of iterative reconstruction technique. CONTRAST:  84m OMNIPAQUE IOHEXOL 350 MG/ML SOLN COMPARISON:  PA Lat chest 01/06/2022, PA Lat chest 12/06/2021. Numerous prior CTs. The 2 most recent are CTA chest 11/29/2021, CTA chest 09/27/2021. FINDINGS: Cardiovascular: Mildly prominent pulmonary trunk 3.4 cm chronically noted indicating arterial hypertension. No embolus is seen through the segmental arteries. The subsegmental arterial bed is largely unopacified due to bolus timing and not evaluated. There is mild cardiomegaly with a left chamber predominance. Small chronic pericardial fluid collects to the right, unchanged. There is mild aortic atherosclerosis without aneurysm, stenosis or dissection. The great vessels are clear. Pulmonary veins are decompressed. There is no visible coronary calcific plaque. Mediastinum/Nodes: Heterogeneous enlarged right thyroid lobe with 4 cm lower pole mass. This has been previously biopsied. This has been most recently evaluated on ultrasound 06/09/2021. There is stable 1.6 x 3 cm well-circumscribed ovoid prevascular soft tissue mass which has been noted on multiple prior studies and which is believed to be a substernal extension of the above-described right lower pole thyroid mass. No intrathoracic or axillary adenopathy is seen. No tracheal lesion. There is no significant esophageal thickening. There is a right axillary fluid collection measuring 3.3 x 1.7 cm on 5:38 which was previously about 3 times this size.  Surgical clips are again noted in the upper inner right breast with interval resolution of the adjacent fluid collection noted on the last CT. Lungs/Pleura: Some respiratory motion artifact is seen on exam. There is mild dependent atelectasis. No confluent infiltrate is seen. There no appreciable pulmonary nodule visible through the motion artifact. There is no pleural effusion, thickening or pneumothorax. Upper Abdomen: No acute abnormality. Musculoskeletal: Mild thoracic kyphodextroscoliosis and degenerative changes of the spine. No destructive or lytic lesions are seen in the thorax. The ribcage is intact. Review of the MIP images confirms the above findings. IMPRESSION: 1. No arterial embolus is seen through the segmental arteries. The subsegmental arterial bed is largely unopacified due to bolus timing or obscured by breathing motion and not evaluated. 2. Cardiomegaly with left chamber predominance. No pulmonary edema or pleural effusion. 3. Aortic atherosclerosis. 4. 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 5. Stable 1.6 x 3 cm prevascular soft tissue mass which is believed to be a substernal extension of the right lower pole thyroid mass. 6. 3.3 x 1.7 cm right axillary fluid collection which was previously much larger. 7. Surgical clips in the upper inner right breast with interval resolution of the adjacent fluid collection noted on the last CT. 8. Mild kyphodextroscoliosis and degenerative change of the spine. Aortic Atherosclerosis (ICD10-I70.0). Electronically Signed   By: KTelford NabM.D.   On: 03/23/2022 23:31    Procedures Procedures    Medications Ordered in ED Medications  iohexol (OMNIPAQUE) 350 MG/ML injection 75 mL (75 mLs Intravenous Contrast Given 03/23/22 2301)  fentaNYL (SUBLIMAZE) injection 50 mcg (50 mcg Intravenous Given 03/24/22 0059)  aspirin chewable tablet 324 mg (324 mg Oral Given 03/24/22 0058)    ED Course/ Medical Decision Making/ A&P Clinical Course  as of 03/24/22 0317  Fri Mar 24, 2022  0105 Hemoglobin(!): 9.1 Anemia [DW]  0105 Glucose(!): 188 Hyperglycemia [DW]  0317 Patient with extensive medical history presents with chest pain.  PE has been ruled out.  However still reports persistent chest pressure.  She is high risk, heart score of 5.  Initial troponin negative.  No acute EKG changes.  I have consulted the hospitalist for admission.  Dr. Nevada Crane to evaluate for admission [DW]    Clinical Course User Index [DW] Ripley Fraise, MD           HEART Score: 5                Medical Decision Making Amount and/or Complexity of Data Reviewed Labs:  Decision-making details documented in ED Course. ECG/medicine tests: ordered.  Risk OTC drugs. Prescription drug management. Decision regarding hospitalization.   This patient presents to the ED for concern of chest pain, this involves an extensive number of treatment options, and is a complaint that carries with it a high risk of complications and morbidity.  The differential diagnosis includes but is not limited to acute coronary syndrome, aortic dissection, pulmonary embolism, pericarditis, pneumothorax, pneumonia, myocarditis, pleurisy, esophageal rupture    Comorbidities that complicate the patient evaluation: Patient's presentation is complicated by their history of breast cancer, previous pulmonary embolism  Social Determinants of Health: Patient's  multiple ER visits   increases the complexity of managing their presentation  Additional history obtained: Additional history obtained from family Records reviewed  outpatient records reviewed  Lab Tests: I Ordered, and personally interpreted labs.  The pertinent results include: Anemia  Imaging Studies ordered: I ordered imaging studies including CT scan chest   I independently visualized and interpreted imaging which showed no PE noted I agree with the radiologist interpretation  Cardiac Monitoring: The patient was  maintained on a cardiac monitor.  I personally viewed and interpreted the cardiac monitor which showed an underlying rhythm of:  sinus rhythm  Medicines ordered and prescription drug management: I ordered medication including fentanyl & aspirin for pain Reevaluation of the patient after these medicines showed that the patient    improved   Critical Interventions:  admission for cardiac evaluation  Consultations Obtained: I requested consultation with the admitting physician triad , and discussed  findings as well as pertinent plan - they recommend: admission  Reevaluation: After the interventions noted above, I reevaluated the patient and found that they have :improved  Complexity of problems addressed: Patient's presentation is most consistent with  acute presentation with potential threat to life or bodily function  Disposition: After consideration of the diagnostic results and the patient's response to treatment,  I feel that the patent would benefit from admission   .           Final Clinical Impression(s) / ED Diagnoses Final diagnoses:  Unstable angina [facility])    Rx / DC Orders ED Discharge Orders     None         Ripley Fraise, MD 03/24/22 [phone]

## 2022-03-24 NOTE — ED Notes (Signed)
ED TO INPATIENT HANDOFF REPORT  S Name/Age/Gender [name] 61 y.o. female Room/Bed: 012C/012C  Code Status   Code Status: Full Code  Home/SNF/Other Home Patient oriented to: self, place, time, and situation Is this baseline? Yes   Triage Complete: Triage complete  Chief Complaint Chest pain [R07.9]  Triage Note Pt arrives to ED from home for chest pain, shortness of breath, left arm pain and swelling of all extremities for ongoing worsening symptoms for a few weeks.   Allergies Allergies  Allergen Reactions   Hydrocodone-Acetaminophen Itching and Other (See Comments)    confusion   Penicillins Swelling and Other (See Comments)    Has patient had a PCN reaction causing immediate rash, facial/tongue/throat swelling, SOB or lightheadedness with hypotension: YES Has patient had a PCN reaction causing severe rash involving mucus membranes or skin necrosis: NO Has patient had a PCN reaction that required hospitalization NO Has patient had a PCN reaction occurring within the last 10 years: NO If all of the above answers are "NO", then may proceed with Cephalosporin use.   Shellfish Allergy Hives   Latex Rash   Sulfa Antibiotics Diarrhea, Itching and Rash    Level of Care/Admitting Diagnosis ED Disposition     ED Disposition  Admit   Condition  --   Comment  Hospital Area: [facility] [[id number]]  Level of Care: Telemetry Cardiac [[id number]]  May place patient in observation at [facility] or [facility] if equivalent level of care is available:: No  Covid Evaluation: Asymptomatic - no recent exposure (last 10 days) testing not required  Diagnosis: Chest pain [211941]  Admitting Physician: Kayleen Memos [[id number]]  Attending Physician: Kayleen Memos [[id number]]          B Medical/Surgery History Past Medical History:  Diagnosis Date   Allergy    Anginal pain ([place])    a. NL cath in 2008;  b. Myoview 03/2011: dec uptake along mid anterior wall  on stress imaging -> ? attenuation vs. ischemia, EF 65%;  c. 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 2008 -> coumadin x 6 mos.   Rheumatoid arthritis ([place])    Sleep apnea    on CPAP 02/2018   SOB (shortness of breath)    Thyroid disease    TIA (transient ischemic attack)    Urinary urgency    Vitamin D deficiency    Past Surgical History:  Procedure Laterality Date  ABDOMINAL HYSTERECTOMY  2005   APPENDECTOMY     BREAST LUMPECTOMY WITH RADIOACTIVE SEED AND SENTINEL LYMPH NODE BIOPSY Right 11/16/2021   Procedure: RIGHT BREAST RADIOACTIVE SEED LOCALIZED LUMPECTOMY WITH RIGHT AXILLARY SENTINEL LYMPH NODE BIOPSY;  Surgeon: Donnie Mesa, MD;  Location: [facility];  Service: General;  Laterality: Right;  LMA & PEC BLOCK   CARDIAC CATHETERIZATION     Normal   CARDIAC CATHETERIZATION N/A 09/30/2014   Procedure: Left Heart Cath and Coronary Angiography;  Surgeon: Sherren Mocha, MD;  Location: [facility];  Service: Cardiovascular;  Laterality: N/A;   LAPAROSCOPIC APPENDECTOMY N/A 06/03/2012   Procedure: APPENDECTOMY LAPAROSCOPIC;  Surgeon: Stark Klein, MD;  Location: [facility];  Service: General;  Laterality: N/A;   Left knee surgery  2008   LEG SURGERY     TONSILLECTOMY     TOTAL  HIP ARTHROPLASTY Left 06/11/2018   Procedure: LEFT TOTAL HIP ARTHROPLASTY ANTERIOR APPROACH;  Surgeon: Meredith Pel, MD;  Location: [facility];  Service: Orthopedics;  Laterality: Left;   TOTAL KNEE ARTHROPLASTY Left 08/22/2016   Procedure: TOTAL KNEE ARTHROPLASTY;  Surgeon: Meredith Pel, MD;  Location: [facility];  Service: Orthopedics;  Laterality: Left;   TUBAL LIGATION  1989     A IV Location/Drains/Wounds Patient Lines/Drains/Airways Status     Active Line/Drains/Airways     Name Placement date Placement time Site Days   Peripheral IV 03/23/22 20 G 1" Anterior;Left Forearm 03/23/22  2213  Forearm  1   External Urinary Catheter 03/24/22  0419  --  less than 1   Incision (Closed) 11/16/21 Breast Right 11/16/21  1305  -- 128   Incision (Closed) 11/16/21 Axilla Right 11/16/21  1305  -- 128            Intake/Output Last 24 hours  Intake/Output Summary (Last 24 hours) at 03/24/2022 1507 Last data filed at 03/24/2022 0646 Gross per 24 hour  Intake 143.32 ml  Output --  Net 143.32 ml    Labs/Imaging Results for orders placed or performed during the hospital encounter of 03/23/22 (from the past 48 hour(s))  Comprehensive metabolic panel     Status: Abnormal   Collection Time: 03/23/22  7:28 PM  Result Value Ref Range   Sodium 145 135 - 145 mmol/L   Potassium 3.1 (L) 3.5 - 5.1 mmol/L   Chloride 106 98 - 111 mmol/L   CO2 26 22 - 32 mmol/L   Glucose, Bld 188 (H) 70 - 99 mg/dL    Comment: Glucose reference range applies only to samples taken after fasting for at least 8 hours.   BUN 13 8 - 23 mg/dL   Creatinine, Ser 0.91 0.44 - 1.00 mg/dL   Calcium 9.1 8.9 - 10.3 mg/dL   Total Protein 5.3 (L) 6.5 - 8.1 g/dL   Albumin 3.2 (L) 3.5 - 5.0 g/dL   AST 23 15 - 41 U/L   ALT 23 0 - 44 U/L   Alkaline Phosphatase 52 38 - 126 U/L   Total Bilirubin 0.4 0.3 - 1.2 mg/dL   GFR, Estimated >60 >60 mL/min    Comment: (NOTE) Calculated using the CKD-EPI Creatinine Equation (2021)     Anion gap 13 5 - 15    Comment: Performed at [facility], [place] [address], [place], [place] [address]  Brain natriuretic peptide     Status: None   Collection Time: 03/23/22  7:30 PM  Result Value Ref Range   B Natriuretic  Peptide 22.9 0.0 - 100.0 pg/mL    Comment: Performed at [facility], [place] [address] [place], [place] [address]  CBC with Differential/Platelet     Status: Abnormal   Collection Time: 03/24/22 12:34 AM  Result Value Ref Range   WBC 4.3 4.0 - 10.5 K/uL   RBC 3.06 (L) 3.87 - 5.11 MIL/uL   Hemoglobin 9.1 (L) 12.0 - 15.0 g/dL   HCT 29.5 (L) 36.0 - 46.0 %   MCV 96.4 80.0 - 100.0 fL   MCH 29.7 26.0 - 34.0 pg   MCHC 30.8 30.0 - 36.0 g/dL   RDW 17.7 (H) 11.5 - 15.5 %   Platelets 184 150 - 400 K/uL   nRBC 0.0 0.0 - 0.2 %   Neutrophils Relative % 38 %   Neutro Abs 1.6 (L) 1.7 - 7.7 K/uL   Lymphocytes Relative 41 %   Lymphs Abs 1.8 0.7 - 4.0 K/uL   Monocytes Relative 19 %   Monocytes Absolute 0.8 0.1 - 1.0 K/uL   Eosinophils Relative 1 %   Eosinophils Absolute 0.0 0.0 - 0.5 K/uL   Basophils Relative 1 %   Basophils Absolute 0.1 0.0 - 0.1 K/uL   Immature Granulocytes 0 %   Abs Immature Granulocytes 0.01 0.00 - 0.07 K/uL    Comment: Performed at [facility], [place] [address] [place], [place] [address]  Troponin I (High Sensitivity)     Status: None   Collection Time: 03/24/22 12:34 AM  Result Value Ref Range   Troponin I (High Sensitivity) 9 <18 ng/L    Comment: (NOTE) Elevated high sensitivity troponin I (hsTnI) values and significant  changes across serial measurements may suggest ACS but many other  chronic and acute conditions are known to elevate hsTnI results.  Refer to the "Links" section for chest pain algorithms and additional  guidance. Performed at [facility], [place] [address], [place], [place] [address]   Resp Panel by RT-PCR (Flu A&B, Covid) Anterior Nasal Swab     Status: None   Collection Time: 03/24/22  1:06 AM   Specimen:  Anterior Nasal Swab  Result Value Ref Range   SARS Coronavirus 2 by RT PCR NEGATIVE NEGATIVE    Comment: (NOTE) SARS-CoV-2 target nucleic acids are NOT DETECTED.  The SARS-CoV-2 RNA is generally detectable in upper respiratory specimens during the acute phase of infection. The lowest concentration of SARS-CoV-2 viral copies this assay can detect is 138 copies/mL. A negative result does not preclude SARS-Cov-2 infection and should not be used as the sole basis for treatment or other patient management decisions. A negative result may occur with  improper specimen collection/handling, submission of specimen other than nasopharyngeal swab, presence of viral mutation(s) within the areas targeted by this assay, and inadequate number of viral copies(<138 copies/mL). A negative result must be combined with clinical observations, patient history, and epidemiological information. The expected result is Negative.  Fact Sheet for Patients:  EntrepreneurPulse.com.au  Fact Sheet for Healthcare Providers:  IncredibleEmployment.be  This test is no t yet approved or cleared by the [place] FDA and  has been authorized for detection and/or diagnosis of SARS-CoV-2 by FDA under an Emergency Use Authorization (EUA). This EUA will remain  in effect (meaning this test can be used) for the duration of the COVID-19 declaration under Section 564(b)(1) of the Act, 21 U.S.C.section 360bbb-3(b)(1), unless the authorization is terminated  or revoked sooner.       Influenza A by PCR  NEGATIVE NEGATIVE   Influenza B by PCR NEGATIVE NEGATIVE    Comment: (NOTE) The Xpert Xpress SARS-CoV-2/FLU/RSV plus assay is intended as an aid in the diagnosis of influenza from Nasopharyngeal swab specimens and should not be used as a sole basis for treatment. Nasal washings and aspirates are unacceptable for Xpert Xpress SARS-CoV-2/FLU/RSV testing.  Fact Sheet for  Patients: EntrepreneurPulse.com.au  Fact Sheet for Healthcare Providers: IncredibleEmployment.be  This test is not yet approved or cleared by the [place] FDA and has been authorized for detection and/or diagnosis of SARS-CoV-2 by FDA under an Emergency Use Authorization (EUA). This EUA will remain in effect (meaning this test can be used) for the duration of the COVID-19 declaration under Section 564(b)(1) of the Act, 21 U.S.C. section 360bbb-3(b)(1), unless the authorization is terminated or revoked.  Performed at [facility], [place] [address] [place], [place] [address]   Troponin I (High Sensitivity)     Status: None   Collection Time: 03/24/22  2:29 AM  Result Value Ref Range   Troponin I (High Sensitivity) 8 <18 ng/L    Comment: (NOTE) Elevated high sensitivity troponin I (hsTnI) values and significant  changes across serial measurements may suggest ACS but many other  chronic and acute conditions are known to elevate hsTnI results.  Refer to the "Links" section for chest pain algorithms and additional  guidance. Performed at [facility], [place] [address] [place], [place] [address]   Comprehensive metabolic panel     Status: Abnormal   Collection Time: 03/24/22  2:29 AM  Result Value Ref Range   Sodium 141 135 - 145 mmol/L   Potassium 3.0 (L) 3.5 - 5.1 mmol/L   Chloride 109 98 - 111 mmol/L   CO2 25 22 - 32 mmol/L   Glucose, Bld 151 (H) 70 - 99 mg/dL    Comment: Glucose reference range applies only to samples taken after fasting for at least 8 hours.   BUN 12 8 - 23 mg/dL   Creatinine, Ser 0.85 0.44 - 1.00 mg/dL   Calcium 8.6 (L) 8.9 - 10.3 mg/dL   Total Protein 5.3 (L) 6.5 - 8.1 g/dL   Albumin 3.0 (L) 3.5 - 5.0 g/dL   AST 19 15 - 41 U/L   ALT 21 0 - 44 U/L   Alkaline Phosphatase 47 38 - 126 U/L   Total Bilirubin 0.2 (L) 0.3 - 1.2 mg/dL   GFR, Estimated >60 >60 mL/min    Comment: (NOTE) Calculated using the  CKD-EPI Creatinine Equation (2021)    Anion gap 7 5 - 15    Comment: Performed at [facility], [place] [address] [place], [place] [address]  Magnesium     Status: Abnormal   Collection Time: 03/24/22  2:29 AM  Result Value Ref Range   Magnesium 1.6 (L) 1.7 - 2.4 mg/dL    Comment: Performed at [facility] [address] [place], [place] [address]  Phosphorus     Status: Abnormal   Collection Time: 03/24/22  2:29 AM  Result Value Ref Range   Phosphorus 4.7 (H) 2.5 - 4.6 mg/dL    Comment: Performed at [facility] [address] [place], [place] [address]  TSH     Status: None   Collection Time: 03/24/22  5:22 AM  Result Value Ref Range   TSH 0.467 0.350 - 4.500 uIU/mL    Comment: Performed by a 3rd Generation assay with a functional sensitivity of <=  0.01 uIU/mL. Performed at [facility], [place] [address] [place], [place] [address]   Comprehensive metabolic panel     Status: Abnormal   Collection Time: 03/24/22  5:22 AM  Result Value Ref Range   Sodium 141 135 - 145 mmol/L   Potassium 3.3 (L) 3.5 - 5.1 mmol/L   Chloride 107 98 - 111 mmol/L   CO2 26 22 - 32 mmol/L   Glucose, Bld 203 (H) 70 - 99 mg/dL    Comment: Glucose reference range applies only to samples taken after fasting for at least 8 hours.   BUN 11 8 - 23 mg/dL   Creatinine, Ser 0.88 0.44 - 1.00 mg/dL   Calcium 8.7 (L) 8.9 - 10.3 mg/dL   Total Protein 4.9 (L) 6.5 - 8.1 g/dL   Albumin 3.0 (L) 3.5 - 5.0 g/dL   AST 23 15 - 41 U/L   ALT 23 0 - 44 U/L   Alkaline Phosphatase 47 38 - 126 U/L   Total Bilirubin 0.3 0.3 - 1.2 mg/dL   GFR, Estimated >60 >60 mL/min    Comment: (NOTE) Calculated using the CKD-EPI Creatinine Equation (2021)    Anion gap 8 5 - 15    Comment: Performed at [facility], [place] [address] [place], [place] [address]  HIV Antibody (routine testing w rflx)     Status: None   Collection Time: 03/24/22  5:22 AM  Result Value Ref Range   HIV Screen 4th Generation wRfx  Non Reactive Non Reactive    Comment: Performed at [facility], [place] [address], [place], [place] [address]   ECHOCARDIOGRAM COMPLETE  Result Date: 03/24/2022    ECHOCARDIOGRAM REPORT   Patient Name:   CHALEE HIROTA Date of Exam: 03/24/2022 Medical Rec #:  [id number]           Height:       59.0 in Accession #:    [id number]          Weight:       210.0 lb Date of Birth:  1961-04-16           BSA:          1.883 m Patient Age:    61 years            BP:           99/58 mmHg Patient Gender: F                   HR:           79 bpm. Exam Location:  Inpatient Procedure: 2D Echo, Cardiac Doppler and Color Doppler Indications:    R07.9* Chest pain, unspecified  History:        Patient has prior history of Echocardiogram examinations, most                 recent 03/09/2021. CHF, TIA, Signs/Symptoms:Dyspnea, Shortness                 of Breath and Chest Pain; Risk Factors:Sleep Apnea,                 Hypertension, Dyslipidemia, GERD and Diabetes.  Sonographer:    Bernadene Person RDCS Referring Phys: [id number] [place]  1. Left ventricular ejection fraction, by estimation, is 60 to 65%. The left ventricle has normal function. The left ventricle has no regional wall motion abnormalities. Left ventricular diastolic parameters were normal.  2.  Right ventricular systolic function is normal. The right ventricular size is normal.  3. The mitral valve is normal in structure. Trivial mitral valve regurgitation. No evidence of mitral stenosis.  4. The aortic valve is tricuspid. Aortic valve regurgitation is trivial. No aortic stenosis is present.  5. The inferior vena cava is dilated in size with <50% respiratory variability, suggesting right atrial pressure of 15 mmHg. FINDINGS  Left Ventricle: Left ventricular ejection fraction, by estimation, is 60 to 65%. The left ventricle has normal function. The left ventricle has no regional wall motion abnormalities. The left ventricular internal cavity size was  normal in size. There is  no left ventricular hypertrophy. Left ventricular diastolic parameters were normal. Right Ventricle: The right ventricular size is normal. Right ventricular systolic function is normal. Left Atrium: Left atrial size was normal in size. Right Atrium: Right atrial size was normal in size. Pericardium: There is no evidence of pericardial effusion. Mitral Valve: The mitral valve is normal in structure. Trivial mitral valve regurgitation. No evidence of mitral valve stenosis. Tricuspid Valve: The tricuspid valve is normal in structure. Tricuspid valve regurgitation is trivial. No evidence of tricuspid stenosis. Aortic Valve: The aortic valve is tricuspid. Aortic valve regurgitation is trivial. No aortic stenosis is present. Pulmonic Valve: The pulmonic valve was normal in structure. Pulmonic valve regurgitation is mild. No evidence of pulmonic stenosis. Aorta: The aortic root is normal in size and structure. Venous: The inferior vena cava is dilated in size with less than 50% respiratory variability, suggesting right atrial pressure of 15 mmHg. IAS/Shunts: No atrial level shunt detected by color flow Doppler.  LEFT VENTRICLE PLAX 2D LVIDd:         4.60 cm     Diastology LVIDs:         2.80 cm     LV e' medial:    6.66 cm/s LV PW:         1.00 cm     LV E/e' medial:  23.4 LV IVS:        1.00 cm     LV e' lateral:   9.90 cm/s LVOT diam:     1.90 cm     LV E/e' lateral: 15.8 LV SV:         76 LV SV Index:   41 LVOT Area:     2.84 cm  LV Volumes (MOD) LV vol d, MOD A2C: 93.1 ml LV vol d, MOD A4C: 73.5 ml LV vol s, MOD A2C: 33.5 ml LV vol s, MOD A4C: 28.7 ml LV SV MOD A2C:     59.6 ml LV SV MOD A4C:     73.5 ml LV SV MOD BP:      52.7 ml RIGHT VENTRICLE RV S prime:     12.00 cm/s TAPSE (M-mode): 2.5 cm LEFT ATRIUM             Index        RIGHT ATRIUM           Index LA diam:        3.80 cm 2.02 cm/m   RA Area:     17.30 cm LA Vol (A2C):   28.6 ml 15.19 ml/m  RA Volume:   48.20 ml  25.60 ml/m LA  Vol (A4C):   35.3 ml 18.75 ml/m LA Biplane Vol: 34.7 ml 18.43 ml/m  AORTIC VALVE LVOT Vmax:   122.00 cm/s LVOT Vmean:  84.300 cm/s LVOT VTI:    0.269 m  AORTA  Ao Root diam: 3.00 cm Ao Asc diam:  3.20 cm MITRAL VALVE MV Area (PHT): 4.68 cm     SHUNTS MV Decel Time: 162 msec     Systemic VTI:  0.27 m MV E velocity: 156.00 cm/s  Systemic Diam: 1.90 cm MV A velocity: 77.10 cm/s MV E/A ratio:  2.02 Kirk Ruths MD Electronically signed by Kirk Ruths MD Signature Date/Time: 03/24/2022/12:02:42 PM    Final    CT Angio Chest PE W and/or Wo Contrast  Result Date: 03/23/2022 CLINICAL DATA:  Chest pain, shortness of breath and left upper extremity pain. Ongoing worsening symptoms for a few weeks. Most recently the patient has a history of right breast cancer with lumpectomy and radioactive seed implant 11/16/2021. EXAM: CT ANGIOGRAPHY CHEST WITH CONTRAST TECHNIQUE: Multidetector CT imaging of the chest was performed using the standard protocol during bolus administration of intravenous contrast. Multiplanar CT image reconstructions and MIPs were obtained to evaluate the vascular anatomy. RADIATION DOSE REDUCTION: This exam was performed according to the departmental dose-optimization program which includes automated exposure control, adjustment of the mA and/or kV according to patient size and/or use of iterative reconstruction technique. CONTRAST:  67m OMNIPAQUE IOHEXOL 350 MG/ML SOLN COMPARISON:  PA Lat chest 01/06/2022, PA Lat chest 12/06/2021. Numerous prior CTs. The 2 most recent are CTA chest 11/29/2021, CTA chest 09/27/2021. FINDINGS: Cardiovascular: Mildly prominent pulmonary trunk 3.4 cm chronically noted indicating arterial hypertension. No embolus is seen through the segmental arteries. The subsegmental arterial bed is largely unopacified due to bolus timing and not evaluated. There is mild cardiomegaly with a left chamber predominance. Small chronic pericardial fluid collects to the right, unchanged.  There is mild aortic atherosclerosis without aneurysm, stenosis or dissection. The great vessels are clear. Pulmonary veins are decompressed. There is no visible coronary calcific plaque. Mediastinum/Nodes: Heterogeneous enlarged right thyroid lobe with 4 cm lower pole mass. This has been previously biopsied. This has been most recently evaluated on ultrasound 06/09/2021. There is stable 1.6 x 3 cm well-circumscribed ovoid prevascular soft tissue mass which has been noted on multiple prior studies and which is believed to be a substernal extension of the above-described right lower pole thyroid mass. No intrathoracic or axillary adenopathy is seen. No tracheal lesion. There is no significant esophageal thickening. There is a right axillary fluid collection measuring 3.3 x 1.7 cm on 5:38 which was previously about 3 times this size. Surgical clips are again noted in the upper inner right breast with interval resolution of the adjacent fluid collection noted on the last CT. Lungs/Pleura: Some respiratory motion artifact is seen on exam. There is mild dependent atelectasis. No confluent infiltrate is seen. There no appreciable pulmonary nodule visible through the motion artifact. There is no pleural effusion, thickening or pneumothorax. Upper Abdomen: No acute abnormality. Musculoskeletal: Mild thoracic kyphodextroscoliosis and degenerative changes of the spine. No destructive or lytic lesions are seen in the thorax. The ribcage is intact. Review of the MIP images confirms the above findings. IMPRESSION: 1. No arterial embolus is seen through the segmental arteries. The subsegmental arterial bed is largely unopacified due to bolus timing or obscured by breathing motion and not evaluated. 2. Cardiomegaly with left chamber predominance. No pulmonary edema or pleural effusion. 3. Aortic atherosclerosis. 4. 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 5. Stable 1.6 x 3 cm prevascular soft tissue mass which is  believed to be a substernal extension of the right lower pole thyroid mass. 6. 3.3 x 1.7 cm right axillary  fluid collection which was previously much larger. 7. Surgical clips in the upper inner right breast with interval resolution of the adjacent fluid collection noted on the last CT. 8. Mild kyphodextroscoliosis and degenerative change of the spine. Aortic Atherosclerosis (ICD10-I70.0). Electronically Signed   By: Telford Nab M.D.   On: 03/23/2022 23:31    Pending Labs Unresulted Labs (From admission, onward)     Start     Ordered   03/31/22 0500  Creatinine, serum  (enoxaparin (LOVENOX)    CrCl >/= 30 ml/min)  Weekly,   R     Comments: while on enoxaparin therapy    03/24/22 0319   03/25/22 2505  Basic metabolic panel  Tomorrow morning,   R        03/24/22 1424   03/25/22 0500  Magnesium  Tomorrow morning,   R        03/24/22 1424   03/23/22 1913  CBC with Differential  Once,   STAT        03/23/22 1915            Vitals/Pain Today's Vitals   03/24/22 1215 03/24/22 1230 03/24/22 1237 03/24/22 1300  BP: 115/68 121/69  114/66  Pulse: 74 77  81  Resp: '17 17  18  '$ Temp:      TempSrc:      SpO2: 98% 95%  95%  Weight:      Height:      PainSc:   9      Isolation Precautions No active isolations  Medications Medications  enoxaparin (LOVENOX) injection 40 mg (40 mg Subcutaneous Given 03/24/22 1041)  HYDROmorphone (DILAUDID) injection 0.5 mg (0.5 mg Intravenous Given 03/24/22 1238)  oxyCODONE (Oxy IR/ROXICODONE) immediate release tablet 5 mg (5 mg Oral Given 03/24/22 0549)  acetaminophen (TYLENOL) tablet 650 mg (has no administration in time range)  0.9 %  sodium chloride infusion (0 mLs Intravenous Stopped 03/24/22 1400)  aspirin chewable tablet 81 mg (81 mg Oral Given 03/24/22 1456)  atorvastatin (LIPITOR) tablet 40 mg (has no administration in time range)  carvedilol (COREG) tablet 6.25 mg (6.25 mg Oral Given 03/24/22 1437)  isosorbide dinitrate (ISORDIL) tablet 20 mg (has  no administration in time range)  busPIRone (BUSPAR) tablet 30 mg (30 mg Oral Given 03/24/22 1437)  hydrOXYzine (ATARAX) tablet 25 mg (has no administration in time range)  prochlorperazine (COMPAZINE) tablet 10 mg (has no administration in time range)  sertraline (ZOLOFT) tablet 25 mg (25 mg Oral Given 03/24/22 1437)  metFORMIN (GLUCOPHAGE) tablet 500 mg (has no administration in time range)  loperamide (IMODIUM) capsule 2 mg (has no administration in time range)  ondansetron (ZOFRAN) tablet 8 mg (has no administration in time range)  pantoprazole (PROTONIX) EC tablet 40 mg (40 mg Oral Given 03/24/22 1437)  albuterol (PROVENTIL) (2.5 MG/3ML) 0.083% nebulizer solution 2.5 mg (has no administration in time range)  furosemide (LASIX) injection 60 mg (60 mg Intravenous Given 03/24/22 1437)  spironolactone (ALDACTONE) tablet 12.5 mg (12.5 mg Oral Given 03/24/22 1459)  empagliflozin (JARDIANCE) tablet 10 mg (10 mg Oral Given 03/24/22 1459)  iohexol (OMNIPAQUE) 350 MG/ML injection 75 mL (75 mLs Intravenous Contrast Given 03/23/22 2301)  fentaNYL (SUBLIMAZE) injection 50 mcg (50 mcg Intravenous Given 03/24/22 0059)  aspirin chewable tablet 324 mg (324 mg Oral Given 03/24/22 0058)  fentaNYL (SUBLIMAZE) injection 25 mcg (25 mcg Intravenous Given 03/24/22 0337)  potassium chloride 10 mEq in 100 mL IVPB (0 mEq Intravenous Stopped 03/24/22 0749)  magnesium sulfate IVPB  2 g 50 mL (0 g Intravenous Stopped 03/24/22 0646)  potassium chloride SA (KLOR-CON M) CR tablet 40 mEq (40 mEq Oral Given 03/24/22 1437)    Mobility walks with device Low fall risk   Focused Assessments Cardiac Assessment Handoff:  Cardiac Rhythm: Normal sinus rhythm Lab Results  Component Value Date   CKTOTAL 118 10/28/2012   CKMB 2.2 10/28/2012   CKMBINDEX 1.9 10/12/2016   TROPONINI 0.07 (HH) 09/02/2018   Lab Results  Component Value Date   DDIMER 1.03 (H) 10/14/2020   Does the Patient currently have chest pain? Yes     R Recommendations: See Admitting Provider Note

## 2022-03-24 NOTE — ED Notes (Signed)
Admit provider at bedside at this time 

## 2022-03-24 NOTE — Assessment & Plan Note (Addendum)
Continue blood pressure metoprolol and losartan. Diuresis with spironolactone and empagliflozin.

## 2022-03-24 NOTE — Assessment & Plan Note (Signed)
Calculated BMI is 42.4

## 2022-03-24 NOTE — ED Notes (Signed)
ED provider at bedside.

## 2022-03-24 NOTE — Progress Notes (Signed)
Echocardiogram 2D Echocardiogram has been performed.  Fidel Levy 03/24/2022, 10:25 AM

## 2022-03-25 DIAGNOSIS — Z79899 Other long term (current) drug therapy: Secondary | ICD-10-CM | POA: Diagnosis not present

## 2022-03-25 DIAGNOSIS — Z86711 Personal history of pulmonary embolism: Secondary | ICD-10-CM | POA: Diagnosis not present

## 2022-03-25 DIAGNOSIS — Z20822 Contact with and (suspected) exposure to covid-19: Secondary | ICD-10-CM | POA: Diagnosis present

## 2022-03-25 DIAGNOSIS — F32A Depression, unspecified: Secondary | ICD-10-CM | POA: Diagnosis present

## 2022-03-25 DIAGNOSIS — E1169 Type 2 diabetes mellitus with other specified complication: Secondary | ICD-10-CM | POA: Diagnosis present

## 2022-03-25 DIAGNOSIS — I10 Essential (primary) hypertension: Secondary | ICD-10-CM | POA: Diagnosis not present

## 2022-03-25 DIAGNOSIS — E785 Hyperlipidemia, unspecified: Secondary | ICD-10-CM | POA: Diagnosis present

## 2022-03-25 DIAGNOSIS — D638 Anemia in other chronic diseases classified elsewhere: Secondary | ICD-10-CM | POA: Diagnosis present

## 2022-03-25 DIAGNOSIS — I440 Atrioventricular block, first degree: Secondary | ICD-10-CM | POA: Diagnosis present

## 2022-03-25 DIAGNOSIS — F3289 Other specified depressive episodes: Secondary | ICD-10-CM

## 2022-03-25 DIAGNOSIS — M5137 Other intervertebral disc degeneration, lumbosacral region: Secondary | ICD-10-CM

## 2022-03-25 DIAGNOSIS — E1165 Type 2 diabetes mellitus with hyperglycemia: Secondary | ICD-10-CM | POA: Diagnosis present

## 2022-03-25 DIAGNOSIS — M069 Rheumatoid arthritis, unspecified: Secondary | ICD-10-CM | POA: Diagnosis present

## 2022-03-25 DIAGNOSIS — N179 Acute kidney failure, unspecified: Secondary | ICD-10-CM | POA: Diagnosis present

## 2022-03-25 DIAGNOSIS — E876 Hypokalemia: Secondary | ICD-10-CM | POA: Diagnosis present

## 2022-03-25 DIAGNOSIS — I5033 Acute on chronic diastolic (congestive) heart failure: Secondary | ICD-10-CM | POA: Diagnosis present

## 2022-03-25 DIAGNOSIS — F419 Anxiety disorder, unspecified: Secondary | ICD-10-CM | POA: Diagnosis present

## 2022-03-25 DIAGNOSIS — R0789 Other chest pain: Secondary | ICD-10-CM | POA: Diagnosis present

## 2022-03-25 DIAGNOSIS — I110 Hypertensive heart disease with heart failure: Secondary | ICD-10-CM | POA: Diagnosis present

## 2022-03-25 DIAGNOSIS — Z87891 Personal history of nicotine dependence: Secondary | ICD-10-CM | POA: Diagnosis not present

## 2022-03-25 DIAGNOSIS — Z6841 Body Mass Index (BMI) 40.0 and over, adult: Secondary | ICD-10-CM | POA: Diagnosis not present

## 2022-03-25 DIAGNOSIS — G8929 Other chronic pain: Secondary | ICD-10-CM | POA: Diagnosis present

## 2022-03-25 DIAGNOSIS — C50411 Malignant neoplasm of upper-outer quadrant of right female breast: Secondary | ICD-10-CM

## 2022-03-25 DIAGNOSIS — Z8249 Family history of ischemic heart disease and other diseases of the circulatory system: Secondary | ICD-10-CM | POA: Diagnosis not present

## 2022-03-25 DIAGNOSIS — Z171 Estrogen receptor negative status [ER-]: Secondary | ICD-10-CM | POA: Diagnosis not present

## 2022-03-25 DIAGNOSIS — Z853 Personal history of malignant neoplasm of breast: Secondary | ICD-10-CM | POA: Diagnosis not present

## 2022-03-25 DIAGNOSIS — Z9221 Personal history of antineoplastic chemotherapy: Secondary | ICD-10-CM | POA: Diagnosis not present

## 2022-03-25 DIAGNOSIS — I509 Heart failure, unspecified: Secondary | ICD-10-CM | POA: Insufficient documentation

## 2022-03-25 LAB — GLUCOSE, CAPILLARY
Glucose-Capillary: 150 mg/dL — ABNORMAL HIGH (ref 70–99)
Glucose-Capillary: 152 mg/dL — ABNORMAL HIGH (ref 70–99)
Glucose-Capillary: 136 mg/dL — ABNORMAL HIGH (ref 70–99)
Glucose-Capillary: 149 mg/dL — ABNORMAL HIGH (ref 70–99)

## 2022-03-25 LAB — BASIC METABOLIC PANEL
Anion gap: 8 (ref 5–15)
BUN: 14 mg/dL (ref 8–23)
CO2: 28 mmol/L (ref 22–32)
Calcium: 8.8 mg/dL — ABNORMAL LOW (ref 8.9–10.3)
Chloride: 108 mmol/L (ref 98–111)
Creatinine, Ser: 1.05 mg/dL — ABNORMAL HIGH (ref 0.44–1.00)
GFR, Estimated: >60 mL/min (ref >60)
Glucose, Bld: 131 mg/dL — ABNORMAL HIGH (ref 70–99)
Potassium: 3.5 mmol/L (ref 3.5–5.1)
Sodium: 144 mmol/L (ref 135–145)

## 2022-03-25 LAB — MAGNESIUM: Magnesium: 2.2 mg/dL (ref 1.7–2.4)

## 2022-03-25 MED ORDER — DIPHENHYDRAMINE HCL 25 MG PO CAPS
25.0000 mg | ORAL_CAPSULE | Freq: Four times a day (QID) | ORAL | Status: DC | PRN
  Administered 2022-03-25 – 2022-03-27 (×3): 25 mg via ORAL
  Filled 2022-03-25 (×3): qty 1

## 2022-03-25 NOTE — Progress Notes (Signed)
Progress Note   Patient: [name] CNO:[id number] DOB: 08-08-1960 DOA: 03/23/2022     0 DOS: the patient was seen and examined on 03/25/2022   Brief hospital course: [name] was admitted to the hospital with the working diagnosis of chest pain in the setting of volume overload.   61 yo female with the past medical history of breast cancer on remission, completed chemotherapy 02/2022, T2DM, hypertension, dyslipidemia, GERD, history of pulmonary embolism, depression and obesity class 3 who presented with chest pain. Reported left shoulder pain that radiated to her chest, pressure like, severe in intensity, prompting her to come to the ED. Her chest pain occurred while she was in bed. She had lower extremity edema for several weeks, having difficulty ambulating and using a walker for mobility. On her initial physical examination her blood pressure was 114/64, HR 81, RR 26 and 02 saturation 98% on room air, heart with S1 and S2 present and rhythmic with no gallops, rubs or murmurs, respiratory with no rales, rhonchi or wheezing, abdomen with no distention, no lower extremity edema.  NA 141, K 3,0, CL 109, bicarbonate 25, glucose 151 bun 12 cr 0,85  Mg 1.6  High sensitive troponin 9 and 8  Wbc 4,3 hgb 9,1 plt 184  Sars covid 19 negative   CT chest with no arterial thrombus, bilateral faint ground glass opacities.  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 Stable 1.6 tp 3 cm pre-vascular soft tissue mass likely extension of thyroid mass. 3.3 x 1.,7 cm right axillary fluid collection which was previously larger.  Surgical clips in the upper inner right breast.   EKG 82 bpm, normal axis, 1st degree AV block, sinus rhythm with 1 wave lead I and AvL, poor R R wave progression, with no significant ST segment or T wave changes.   12/02 patient placed on furosemide for diuresis with improvement in volume status but not back to baseline.   Assessment and Plan: * Acute on  chronic diastolic CHF (congestive heart failure) (HCC) Echocardiogram with preserved LV systolic function, EF 60 to 65%, no wall motion abnormalities, RV systolic function preserved, no significant valvular disease.   Urine output is 2,947 ml  Systolic blood pressure is 120 to 140 mmHg.  Continue diuresis with furosemide, spironolactone and SGLT 2 inh.  Continue blood pressure monitoring.  Continue with carvedilol and isosorbide.   HTN (hypertension) Continue blood pressure control with metoprolol and isosorbide. Continue diuresis with furosemide, spironolactone and empagliflozin.   Hypokalemia Renal function with serum cr at 1,0 with K at 3,5 and serum bicarbonate at 28 Continue diuresis with furosemide, spironolactone and SGLT 2 inh.   Type 2 diabetes mellitus with hyperlipidemia (HCC) Continue insulin sliding scale for glucose cover and monitoring Fasting glucose today 131, improved hyperglycemia.   DEGENERATION, LUMBAR/LUMBOSACRAL DISC Patient with chronic back pain and now with worsening ambulatory dysfunction. Continue pain control with oral analgesics. PT and OT evaluation Out of bed to chair tid with meals.   Depression Continue with buspirone, sertraline and hydroxyzine   Class 3 severe obesity with serious comorbidity and body mass index (BMI) of 40.0 to 44.9 in adult (HCC) Calculated BMI is 42.4  Malignant neoplasm of upper-outer quadrant of female breast [facility]) Patient will follow up as outpatient.         Subjective: Patient is feeling better, edema and pain have improved but not back to baseline, no nausea or vomiting.   Physical Exam: Vitals:   03/25/22 0400 03/25/22  6811 03/25/22 1128 03/25/22 1540  BP: 128/62 (!) 149/97 113/65 120/67  Pulse: 73 74 74   Resp: '15 19 10 19  '$ Temp: 98.1 F (36.7 C) 98.1 F (36.7 C) 97.9 F (36.6 C) 98 F (36.7 C)  TempSrc: Oral Oral Oral Oral  SpO2: 98% 96% 95%   Weight:      Height:       Neurology awake and  alert ENT with mild pallor Cardiovascular with S1 and S2 present and rhythmic with no gallops, rubs or murmurs Respiratory with no mild rales but now wheezing Abdomen with no distention  Lower extremity edema ++  Data Reviewed:    Family Communication: I spoke with patient's friend at the bedside, we talked in detail about patient's condition, plan of care and prognosis and all questions were addressed.   Disposition: Status is: Inpatient Remains inpatient appropriate because: diuresis with IV furosemide   Planned Discharge Destination: Home   s  Author: Tawni Millers, MD 03/25/2022 3:52 PM  For on call review [email or web address].

## 2022-03-26 DIAGNOSIS — E785 Hyperlipidemia, unspecified: Secondary | ICD-10-CM

## 2022-03-26 DIAGNOSIS — E1169 Type 2 diabetes mellitus with other specified complication: Secondary | ICD-10-CM

## 2022-03-26 LAB — GLUCOSE, CAPILLARY
Glucose-Capillary: 121 mg/dL — ABNORMAL HIGH (ref 70–99)
Glucose-Capillary: 165 mg/dL — ABNORMAL HIGH (ref 70–99)
Glucose-Capillary: 143 mg/dL — ABNORMAL HIGH (ref 70–99)
Glucose-Capillary: 156 mg/dL — ABNORMAL HIGH (ref 70–99)

## 2022-03-26 LAB — BASIC METABOLIC PANEL
Anion gap: 7 (ref 5–15)
BUN: 17 mg/dL (ref 8–23)
CO2: 29 mmol/L (ref 22–32)
Calcium: 8.8 mg/dL — ABNORMAL LOW (ref 8.9–10.3)
Chloride: 105 mmol/L (ref 98–111)
Creatinine, Ser: 0.99 mg/dL (ref 0.44–1.00)
GFR, Estimated: >60 mL/min (ref >60)
Glucose, Bld: 135 mg/dL — ABNORMAL HIGH (ref 70–99)
Potassium: 3.4 mmol/L — ABNORMAL LOW (ref 3.5–5.1)
Sodium: 141 mmol/L (ref 135–145)

## 2022-03-26 LAB — MAGNESIUM: Magnesium: 2 mg/dL (ref 1.7–2.4)

## 2022-03-26 MED ORDER — POTASSIUM CHLORIDE CRYS ER 20 MEQ PO TBCR
40.0000 meq | EXTENDED_RELEASE_TABLET | ORAL | Status: AC
  Administered 2022-03-26 (×2): 40 meq via ORAL
  Filled 2022-03-26 (×2): qty 2

## 2022-03-26 NOTE — Evaluation (Signed)
Physical Therapy Evaluation Patient Details Name: [name] MRN: [id number] DOB: 03/30/1961 Today's Date: 03/26/2022  History of Present Illness  Pt is a 61 y.o. F who presents 03/23/2022 with complaints of chest pain. Admitted to rule out ACS. Significant PMH: breast CA in remission, completed chemotherapy 03/03/2022, DM2, HTN, HLD, chronic anxiety/depression, severe morbid obesity, hx of pulmonary embolism.  Clinical Impression  PTA, pt lives alone in a level entry apartment and is independent. Pt presents with decreased functional mobility secondary to decreased cardiopulmonary endurance, weakness, and impaired balance. Pt ambulating 60 ft with a Rollator at a min guard assist level. Pt reports headache, dizziness, and low back pain with radicular symptoms to BLE's. BP 126/73 (90), HR 92, SpO2 96% on RA. Would benefit from HHPT to address deficits.     Recommendations for follow up therapy are one component of a multi-disciplinary discharge planning process, led by the attending physician.  Recommendations may be updated based on patient status, additional functional criteria and insurance authorization.  Follow Up Recommendations Home health PT      Assistance Recommended at Discharge PRN  Patient can return home with the following  A little help with bathing/dressing/bathroom;Assistance with cooking/housework;Assist for transportation;Help with stairs or ramp for entrance    Equipment Recommendations Rollator (4 wheels) (bariatric)  Recommendations for Other Services       Functional Status Assessment Patient has had a recent decline in their functional status and demonstrates the ability to make significant improvements in function in a reasonable and predictable amount of time.     Precautions / Restrictions Precautions Precautions: Fall Restrictions Weight Bearing Restrictions: No      Mobility  Bed Mobility Overal bed mobility: Modified Independent              General bed mobility comments: + use of bed rail    Transfers Overall transfer level: Needs assistance Equipment used: Rollator (4 wheels) Transfers: Sit to/from Stand Sit to Stand: Supervision                Ambulation/Gait Ambulation/Gait assistance: Min guard Gait Distance (Feet): 60 Feet Assistive device: Rollator (4 wheels) Gait Pattern/deviations: Step-to pattern, Decreased stride length Gait velocity: decreased Gait velocity interpretation: <1.31 ft/sec, indicative of household ambulator   General Gait Details: Slow pace, requiring frequent standing rest breaks, min guard for safety  Stairs            Wheelchair Mobility    Modified Rankin (Stroke Patients Only)       Balance Overall balance assessment: Mild deficits observed, not formally tested                                           Pertinent Vitals/Pain Pain Assessment Pain Assessment: Faces Faces Pain Scale: Hurts little more Pain Location: lower back with radicular pain to bilateral thighs, head Pain Descriptors / Indicators: Headache, Radiating Pain Intervention(s): Limited activity within patient's tolerance, Monitored during session, Patient requesting pain meds-RN notified    Home Living Family/patient expects to be discharged to:: Private residence Living Arrangements: Alone Available Help at Discharge: Family Type of Home: Apartment Home Access: Level entry       Home Layout: One level Home Equipment: Cane - single point;Shower seat      Prior Function Prior Level of Function : Independent/Modified Independent;Driving  ADLs Comments: intermittent assist with IADL's from daughter     Hand Dominance        Extremity/Trunk Assessment   Upper Extremity Assessment Upper Extremity Assessment: Defer to OT evaluation    Lower Extremity Assessment Lower Extremity Assessment: Generalized weakness       Communication    Communication: No difficulties  Cognition Arousal/Alertness: Awake/alert Behavior During Therapy: WFL for tasks assessed/performed Overall Cognitive Status: Within Functional Limits for tasks assessed                                          General Comments      Exercises     Assessment/Plan    PT Assessment Patient needs continued PT services  PT Problem List Decreased strength;Decreased activity tolerance;Decreased balance;Decreased mobility;Cardiopulmonary status limiting activity       PT Treatment Interventions DME instruction;Gait training;Therapeutic activities;Functional mobility training;Therapeutic exercise;Balance training;Patient/family education    PT Goals (Current goals can be found in the Care Plan section)  Acute Rehab PT Goals Patient Stated Goal: improve energy PT Goal Formulation: With patient Time For Goal Achievement: 04/09/22 Potential to Achieve Goals: Good    Frequency Min 3X/week     Co-evaluation               AM-PAC PT "6 Clicks" Mobility  Outcome Measure Help needed turning from your back to your side while in a flat bed without using bedrails?: None Help needed moving from lying on your back to sitting on the side of a flat bed without using bedrails?: None Help needed moving to and from a bed to a chair (including a wheelchair)?: A Little Help needed standing up from a chair using your arms (e.g., wheelchair or bedside chair)?: A Little Help needed to walk in hospital room?: A Little Help needed climbing 3-5 steps with a railing? : A Lot 6 Click Score: 19    End of Session   Activity Tolerance: Patient tolerated treatment well Patient left: in chair;with call bell/phone within reach Nurse Communication: Mobility status;Patient requests pain meds PT Visit Diagnosis: Unsteadiness on feet (R26.81);Difficulty in walking, not elsewhere classified (R26.2)    Time: 1610-9604 PT Time Calculation (min) (ACUTE ONLY):  27 min   Charges:   PT Evaluation $PT Eval Low Complexity: 1 Low PT Treatments $Therapeutic Activity: 8-22 mins        [name], PT, DPT Acute Rehabilitation Services Office [phone]   [name] 03/26/2022, 10:20 AM

## 2022-03-26 NOTE — Progress Notes (Signed)
RT placed patient on CPAP HS. NO O2 bleed in needed. Patient tolerating well at this time.  

## 2022-03-26 NOTE — Progress Notes (Signed)
Progress Note   Patient: [name] MWN:[id number] DOB: 07-28-1960 DOA: 03/23/2022     1 DOS: the patient was seen and examined on 03/26/2022   Brief hospital course: [name] was admitted to the hospital with the working diagnosis of chest pain in the setting of volume overload.   61 yo female with the past medical history of breast cancer on remission, completed chemotherapy 02/2022, T2DM, hypertension, dyslipidemia, GERD, history of pulmonary embolism, depression and obesity class 3 who presented with chest pain. Reported left shoulder pain that radiated to her chest, pressure like, severe in intensity, prompting her to come to the ED. Her chest pain occurred while she was in bed. She had lower extremity edema for several weeks, having difficulty ambulating and using a walker for mobility. On her initial physical examination her blood pressure was 114/64, HR 81, RR 26 and 02 saturation 98% on room air, heart with S1 and S2 present and rhythmic with no gallops, rubs or murmurs, respiratory with no rales, rhonchi or wheezing, abdomen with no distention, no lower extremity edema.  NA 141, K 3,0, CL 109, bicarbonate 25, glucose 151 bun 12 cr 0,85  Mg 1.6  High sensitive troponin 9 and 8  Wbc 4,3 hgb 9,1 plt 184  Sars covid 19 negative   CT chest with no arterial thrombus, bilateral faint ground glass opacities.  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 Stable 1.6 tp 3 cm pre-vascular soft tissue mass likely extension of thyroid mass. 3.3 x 1.,7 cm right axillary fluid collection which was previously larger.  Surgical clips in the upper inner right breast.   EKG 82 bpm, normal axis, 1st degree AV block, sinus rhythm with 1 wave lead I and AvL, poor R R wave progression, with no significant ST segment or T wave changes.   12/02 patient placed on furosemide for diuresis with improvement in volume status but not back to baseline.  12/03 responding well to diuresis.    Assessment and Plan: * Acute on chronic diastolic CHF (congestive heart failure) (HCC) Echocardiogram with preserved LV systolic function, EF 60 to 65%, no wall motion abnormalities, RV systolic function preserved, no significant valvular disease.   Urine output is 4,034  ml  Systolic blood pressure is 126 to 141 mmHg.  Continue diuresis with furosemide, spironolactone and SGLT 2 inh.  Will continue today with IV furosemide and possible transition to po loop diuretic tomorrow.  Continue blood pressure monitoring.  Continue with carvedilol and isosorbide.  Plan to add ARB when more euvolemic.   HTN (hypertension) Continue blood pressure control with metoprolol and isosorbide. Continue diuresis with furosemide, spironolactone and empagliflozin.   Hypokalemia Renal function stable with serum cr at 0,99, K is 3,4 and serum bicarbonate at 29. Plan to continue diuresis and correct Kcl with 80 meq Kcl in 2 divided doses.  Follow up renal function in am, avoid hypotension and nephrotoxic medications.   Type 2 diabetes mellitus with hyperlipidemia (HCC) Continue insulin sliding scale for glucose cover and monitoring Fasting glucose today 135, improved hyperglycemia.   DEGENERATION, LUMBAR/LUMBOSACRAL DISC Patient with chronic back pain and now with worsening ambulatory dysfunction. Continue pain control with oral analgesics. PT and OT evaluation Out of bed to chair tid with meals.   Depression Continue with buspirone, sertraline and hydroxyzine   Class 3 severe obesity with serious comorbidity and body mass index (BMI) of 40.0 to 44.9 in adult (HCC) Calculated BMI is 42.4  Malignant neoplasm of upper-outer  quadrant of female breast [facility]) Patient will follow up as outpatient.         Subjective: Patient with improvement in her symptoms but not yet back to baseline, edema and mobility have been improving, on significant chest pain or arm pain.   Physical Exam: Vitals:    03/25/22 2342 03/26/22 0439 03/26/22 0726 03/26/22 1103  BP:  121/61 110/64 (!) 141/99  Pulse:  74 75 78  Resp: '17 16 17 18  '$ Temp:  98 F (36.7 C) 97.8 F (36.6 C) 98 F (36.7 C)  TempSrc:  Oral Oral Oral  SpO2:  95% 94% 96%  Weight:      Height:       Neurology awake and alert ENT with mild pallor Cardiovascular with S1 and S2 present and rhythmic with no gallops, rubs or murmurs Respiratory with mild rales but no wheezing or rhonchi Abdomen with no distention  Lower extremity trace edema  Data Reviewed:    Family Communication: no family at the bedside   Disposition: Status is: Inpatient Remains inpatient appropriate because: IV diuresis   Planned Discharge Destination: Home      Author: Tawni Millers, MD 03/26/2022 4:15 PM  For on call review [email or web address].

## 2022-03-26 NOTE — Evaluation (Signed)
Occupational Therapy Evaluation Patient Details Name: [name] MRN: [id number] DOB: [birth year] Today's Date: 03/26/2022   History of Present Illness Pt is a 61 y.o. F who presents 03/23/2022 with complaints of chest pain. Admitted to rule out ACS. Significant PMH: breast CA in remission, completed chemotherapy 03/03/2022, DM2, HTN, HLD, chronic anxiety/depression, severe morbid obesity, hx of pulmonary embolism.   Clinical Impression   [name] was evaluated s/p the above admission list, she is generally mod I at [place] and her daughter assists with IADLs as needed. Upon evaluation pt had functional limitations due to generalized weakness, decreased cardiopulmonary endurance, BLE pain, and increased SOB with activity. Overall she was min G-supervision A for all mobility with rollator and needs up to mod A for ADLs. She will benefit from OT acutely, recommend d/c to home with [place].     Recommendations for follow up therapy are one component of a multi-disciplinary discharge planning process, led by the attending physician.  Recommendations may be updated based on patient status, additional functional criteria and insurance authorization.   Follow Up Recommendations  Home health OT     Assistance Recommended at Discharge Intermittent Supervision/Assistance  Patient can return home with the following A little help with walking and/or transfers;A little help with bathing/dressing/bathroom;Assist for transportation;Assistance with cooking/housework    Functional Status Assessment  Patient has had a recent decline in their functional status and demonstrates the ability to make significant improvements in function in a reasonable and predictable amount of time.  Equipment Recommendations  None recommended by OT       Precautions / Restrictions Precautions Precautions: Fall Restrictions Weight Bearing Restrictions: No      Mobility Bed Mobility Overal bed mobility: Needs  Assistance Bed Mobility: Sit to Supine       Sit to supine: Supervision   General bed mobility comments: increased time/effort    Transfers Overall transfer level: Needs assistance Equipment used: Rollator (4 wheels) Transfers: Sit to/from Stand Sit to Stand: Supervision                  Balance Overall balance assessment: Mild deficits observed, not formally tested         ADL either performed or assessed with clinical judgement   ADL Overall ADL's : Needs assistance/impaired Eating/Feeding: Independent   Grooming: Min guard;Standing   Upper Body Bathing: Set up;Sitting   Lower Body Bathing: Moderate assistance;Sit to/from stand   Upper Body Dressing : Set up;Sitting   Lower Body Dressing: Moderate assistance;Sit to/from stand   Toilet Transfer: Min guard;Ambulation;Rollator (4 wheels)   Toileting- Clothing Manipulation and Hygiene: Supervision/safety;Sitting/lateral lean       Functional mobility during ADLs: Min guard;Rollator (4 wheels) General ADL Comments: significantly increased time needed     Vision Baseline Vision/History: 0 No visual deficits Ability to See in Adequate Light: 0 Adequate Vision Assessment?: No apparent visual deficits     Perception Perception Perception Tested?: No   Praxis Praxis Praxis tested?: Not tested    Pertinent Vitals/Pain Pain Assessment Pain Assessment: Faces Faces Pain Scale: Hurts little more Pain Location: RLE Pain Descriptors / Indicators: Discomfort Pain Intervention(s): Limited activity within patient's tolerance, Monitored during session     Hand Dominance Right   Extremity/Trunk Assessment Upper Extremity Assessment Upper Extremity Assessment: Generalized weakness   Lower Extremity Assessment Lower Extremity Assessment: Defer to PT evaluation   Cervical / Trunk Assessment Cervical / Trunk Assessment: Other exceptions Cervical / Trunk Exceptions: increased body habitus   Communication  Communication Communication: No difficulties   Cognition Arousal/Alertness: Awake/alert Behavior During Therapy: WFL for tasks assessed/performed, Flat affect Overall Cognitive Status: Within Functional Limits for tasks assessed               General Comments  VSS on RA, SOB with mobility     Home Living Family/patient expects to be discharged to:: Private residence Living Arrangements: Alone Available Help at Discharge: Family Type of Home: Apartment Home Access: Level entry     Home Layout: One level     Bathroom Shower/Tub: Tub/shower unit;Walk-in shower   Bathroom Toilet: Standard Bathroom Accessibility: Yes   Home Equipment: Cane - single point;Shower seat          Prior Functioning/Environment Prior Level of Function : Independent/Modified Independent;Driving             Mobility Comments: SPC in the house ADLs Comments: intermittent assist with IADL's from daughter        OT Problem List: Decreased strength;Decreased activity tolerance;Decreased range of motion;Cardiopulmonary status limiting activity      OT Treatment/Interventions: Self-care/ADL training;Therapeutic exercise;DME and/or AE instruction;Therapeutic activities;Patient/family education;Balance training    OT Goals(Current goals can be found in the care plan section) Acute Rehab OT Goals Patient Stated Goal: to go home OT Goal Formulation: With patient Time For Goal Achievement: 04/09/22 Potential to Achieve Goals: Good ADL Goals Pt Will Perform Grooming: with modified independence;standing Pt Will Perform Lower Body Dressing: with modified independence;sit to/from stand Pt Will Transfer to Toilet: with modified independence;ambulating Additional ADL Goal #1: pt will demonstrated increased activity tolernace to complete at least 3 ADLs in standing without sitting rest break  OT Frequency: Min 2X/week       AM-PAC OT "6 Clicks" Daily Activity     Outcome Measure Help from  another person eating meals?: None Help from another person taking care of personal grooming?: A Little Help from another person toileting, which includes using toliet, bedpan, or urinal?: A Little Help from another person bathing (including washing, rinsing, drying)?: A Lot Help from another person to put on and taking off regular upper body clothing?: None Help from another person to put on and taking off regular lower body clothing?: A Lot 6 Click Score: 18   End of Session Equipment Utilized During Treatment: Rollator (4 wheels) Nurse Communication: Mobility status  Activity Tolerance: Patient tolerated treatment well Patient left: in bed;with call bell/phone within reach  OT Visit Diagnosis: Unsteadiness on feet (R26.81);Other abnormalities of gait and mobility (R26.89);Muscle weakness (generalized) (M62.81)                Time: 3762-8315 OT Time Calculation (min): 19 min Charges:  OT General Charges $OT Visit: 1 Visit OT Evaluation $OT Eval Moderate Complexity: 1 Mod   Erico Stan D Causey 03/26/2022, 1:10 PM

## 2022-03-27 ENCOUNTER — Ambulatory Visit: Payer: Medicare Other

## 2022-03-27 LAB — GLUCOSE, CAPILLARY
Glucose-Capillary: 136 mg/dL — ABNORMAL HIGH (ref 70–99)
Glucose-Capillary: 167 mg/dL — ABNORMAL HIGH (ref 70–99)
Glucose-Capillary: 102 mg/dL — ABNORMAL HIGH (ref 70–99)
Glucose-Capillary: 150 mg/dL — ABNORMAL HIGH (ref 70–99)

## 2022-03-27 LAB — BASIC METABOLIC PANEL
Anion gap: 8 (ref 5–15)
BUN: 19 mg/dL (ref 8–23)
CO2: 29 mmol/L (ref 22–32)
Calcium: 9 mg/dL (ref 8.9–10.3)
Chloride: 102 mmol/L (ref 98–111)
Creatinine, Ser: 1.21 mg/dL — ABNORMAL HIGH (ref 0.44–1.00)
GFR, Estimated: 51 mL/min — ABNORMAL LOW (ref >60)
Glucose, Bld: 132 mg/dL — ABNORMAL HIGH (ref 70–99)
Potassium: 4.1 mmol/L (ref 3.5–5.1)
Sodium: 139 mmol/L (ref 135–145)

## 2022-03-27 LAB — MAGNESIUM: Magnesium: 2.2 mg/dL (ref 1.7–2.4)

## 2022-03-27 NOTE — Progress Notes (Signed)
Heart Failure Nurse Navigator Progress Note  Spoke with patient regarding potential for HV TOC clinic. Admission and swelling does not appear to be related to HF. Spoke with AHF team physician to confirm low benefit of TOC appointment. Encouraged patient to call oncology team to notify of hospital admission.  Pt states she has completed chemotherapy and will start radiation in the next few months.   EF 60-65%, RV WNL.   HF Navigation team will sign-off.    Pricilla Holm, MSN, RN Heart Failure Nurse Navigator

## 2022-03-27 NOTE — Progress Notes (Signed)
Progress Note   Patient: [name] VHQ:[id number] DOB: [birth year] DOA: 03/23/2022     2 DOS: the patient was seen and examined on 03/27/2022   Brief hospital course: [name] was admitted to the hospital with the working diagnosis of chest pain in the setting of volume overload.   61 yo female with the past medical history of breast cancer on remission, completed chemotherapy 02/2022, T2DM, hypertension, dyslipidemia, GERD, history of pulmonary embolism, depression and obesity class 3 who presented with chest pain. Reported left shoulder pain that radiated to her chest, pressure like, severe in intensity, prompting her to come to the ED. Her chest pain occurred while she was in bed. She had lower extremity edema for several weeks, having difficulty ambulating and using a walker for mobility. On her initial physical examination her blood pressure was 114/64, HR 81, RR 26 and 02 saturation 98% on room air, heart with S1 and S2 present and rhythmic with no gallops, rubs or murmurs, respiratory with no rales, rhonchi or wheezing, abdomen with no distention, no lower extremity edema.  NA 141, K 3,0, CL 109, bicarbonate 25, glucose 151 bun 12 cr 0,85  Mg 1.6  High sensitive troponin 9 and 8  Wbc 4,3 hgb 9,1 plt 184  Sars covid 19 negative   CT chest with no arterial thrombus, bilateral faint ground glass opacities.  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 Stable 1.6 tp 3 cm pre-vascular soft tissue mass likely extension of thyroid mass. 3.3 x 1.,7 cm right axillary fluid collection which was previously larger.  Surgical clips in the upper inner right breast.   EKG 82 bpm, normal axis, 1st degree AV block, sinus rhythm with 1 wave lead I and AvL, poor R R wave progression, with no significant ST segment or T wave changes.   12/02 patient placed on furosemide for diuresis with improvement in volume status but not back to baseline.  12/03 responding well to diuresis.   12/04 volume status has improved, pending renal function to be more stable.   Assessment and Plan: * Acute on chronic diastolic CHF (congestive heart failure) (HCC) Echocardiogram with preserved LV systolic function, EF 60 to 65%, no wall motion abnormalities, RV systolic function preserved, no significant valvular disease.   Urine output is 4,132 ml  Systolic blood pressure is 120 to 150 mmHg.  Diuresis with spironolactone and SGLT 2 inh.  Hold on furosemide IV for today due to increased Cr.   Continue with carvedilol and isosorbide.  Plan to add ARB renal function more stable.   HTN (hypertension) On metoprolol and isosorbide. Diuresis with spironolactone and empagliflozin. Possible ARB when renal function more stable.    AKI (acute kidney injury) ([place]) Hypokalemia.  Electrolytes have been corrected. K is 4,1 with serum bicarbonate at 29, Mg 2,2 and NA 139.  Renal function with serum cr at 1.21    Type 2 diabetes mellitus with hyperlipidemia (HCC) Continue insulin sliding scale for glucose cover and monitoring Fasting glucose today 132 mg/dl.  Continue with metformin  DEGENERATION, LUMBAR/LUMBOSACRAL DISC Patient with chronic back pain and now with worsening ambulatory dysfunction. Continue pain control with oral analgesics. PT and OT evaluation Out of bed to chair tid with meals.   Depression Continue with buspirone, sertraline and hydroxyzine   Class 3 severe obesity with serious comorbidity and body mass index (BMI) of 40.0 to 44.9 in adult (HCC) Calculated BMI is 42.4  Malignant neoplasm of upper-outer quadrant of female breast (  [facility]) Patient will follow up as outpatient.         Subjective: Patient is feeling better, improved dyspnea and edema. Right shoulder pain is controlled.   Physical Exam: Vitals:   03/26/22 1929 03/26/22 2340 03/27/22 0311 03/27/22 0750  BP: 128/66 (!) 109/53 119/62 128/61  Pulse: 80 74  67  Resp: '19 18 15 15  '$ Temp: 97.8 F  (36.6 C) 98.2 F (36.8 C) (!) 97.4 F (36.3 C) 98 F (36.7 C)  TempSrc: Oral Oral Axillary Oral  SpO2: 96% 97% 95% 96%  Weight:      Height:       Neurology awake and alert ENT with mild pallor Cardiovascular with S1 and S2 present and rhythmic with no gallops, rubs or murmurs Respiratory with no rales or wheezing Abdomen with no distention No lower extremity edema  Data Reviewed:    Family Communication: no family at the bedside   Disposition: Status is: Inpatient Remains inpatient appropriate because: pending renal function to stabilize.   Planned Discharge Destination: Home     Author: Tawni Millers, MD 03/27/2022 11:11 AM  For on call review [email or web address].

## 2022-03-27 NOTE — Progress Notes (Signed)
Patient has CPAP HS at beside and able to place on when ready. NO O2 bleed in needed. Patient tolerating well at this time.

## 2022-03-27 NOTE — TOC Initial Note (Addendum)
Transition of Care [facility]) - Initial/Assessment Note    Patient Details  Name: [name] MRN: [id number] Date of Birth: [birth year]  Transition of Care [facility]) CM/SW Contact:    Marilu Favre, RN Phone Number: 03/27/2022, 11:24 AM  Clinical Narrative:                  Spoke to patient at bedside. From home alone.   PT/OT  recommendations Home health PT/OT and bariatric rollator. Patient in agreement. She had home health 3 years ago that was arranged while she was a patient at [facility] and would like the same agency . Per EPIC home health agency was [place]. Claiborne Billings with [facility] accepted referral for HHRN,PT and OT.   Bartric rollator will need to call Erasmo Downer with [facility] closer to discharge   Asked MD to sign orders and face to face.   Patient has transportation to appointments and can get medications filled.   PCP is Dr Viviann Spare   Expected Discharge Plan: [place] Barriers to Discharge: Continued Medical Work up   Patient Goals and CMS Choice Patient states their goals for this hospitalization and ongoing recovery are:: to return to home CMS Medicare.gov Compare Post Acute Care list provided to:: Patient Choice offered to / list presented to : Patient  Expected Discharge Plan and Services Expected Discharge Plan: [place]   Discharge Planning Services: CM Consult Post Acute Care Choice: [place] arrangements for the past 2 months: Apartment                 DME Arranged: Walker rolling with seat (Bariatric Rollator) DME Agency: [employer] Date DME Agency Contacted: 03/27/22 Time DME Agency Contacted: [id number] Representative spoke with at DME Agency: Erasmo Downer HH Arranged: RN, Disease Management, PT, OT [place] Agency: [place] Date [place]: 03/27/22 Time [place]: 58 Representative spoke with at [place]: Claiborne Billings  Prior Living Arrangements/Services Living arrangements  for the past 2 months: [place] with:: Self Patient language and need for interpreter reviewed:: Yes Do you feel safe going back to the place where you live?: Yes      Need for Family Participation in Patient Care: Yes (Comment) Care giver support system in place?: Yes (comment)   Criminal Activity/Legal Involvement Pertinent to Current Situation/Hospitalization: No - Comment as needed  Activities of Daily Living      Permission Sought/Granted   Permission granted to share information with : No              Emotional Assessment Appearance:: Appears stated age Attitude/Demeanor/Rapport: Engaged Affect (typically observed): Accepting Orientation: : Oriented to Self, Oriented to Place, Oriented to  Time, Oriented to Situation Alcohol / Substance Use: Not Applicable Psych Involvement: No (comment)  Admission diagnosis:  Unstable angina ([place]) [I20.0] Chest pain [R07.9] Heart failure ([place]) [I50.9] Patient Active Problem List   Diagnosis Date Noted   Heart failure ([place]) 03/25/2022   Chest pain 03/24/2022   Multiple thyroid nodules 11/28/2021   Genetic testing 11/22/2021   Family history of breast cancer 11/03/2021   Family history of colon cancer 11/03/2021   Malignant neoplasm of upper-outer quadrant of female breast ([place]) 11/02/2021   Back pain 06/01/2021   Right thyroid nodule 05/31/2021   At risk for impaired metabolic function 50/12/3816   Unilateral primary osteoarthritis, left hip    Hip arthritis 06/11/2018   Class 3 severe obesity with serious comorbidity and body mass  index (BMI) of 40.0 to 44.9 in adult [facility]) 05/29/2018   Other fatigue 11/20/2017   Shortness of breath on exertion 11/20/2017   Type 2 diabetes mellitus with hyperlipidemia ([place]) 11/20/2017   Vitamin D deficiency 11/20/2017   Depression 11/20/2017   Other hyperlipidemia 11/20/2017   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/04/2016   Presence of left artificial knee joint 09/20/2016   Arthritis of  knee 08/22/2016   Cervical radiculopathy 07/26/2016   Cervical disc disorder with radiculopathy 07/26/2016   Right arm pain 06/29/2016   Cervicalgia 06/29/2016   Primary osteoarthritis of left knee 06/29/2016   Hypersomnia with sleep apnea 11/18/2015   Lethargy 11/18/2015   Exertional chest pain 09/30/2014   Abnormal cardiac function test 09/29/2014   Chest pain with moderate risk for cardiac etiology 09/28/2014   HTN (hypertension) 09/28/2014   AKI (acute kidney injury) ([place])    Paresthesia 06/18/2014   Numbness and tingling of left arm and leg    Unstable angina ([place]) 05/24/2014   OSA on CPAP 05/24/2014   CAD (coronary artery disease) 11/25/2012   S/P laparoscopic appendectomy 06/04/2012   Acute on chronic diastolic CHF (congestive heart failure) ([place]) 01/09/2012   Mediastinal mass 01/09/2012   SOB (shortness of breath) 06/20/2011   Mediastinal abnormality 06/20/2011   Dyslipidemia 12/23/2009   GLAUCOMA 12/23/2009   ARTHRITIS 12/23/2009   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   Class 2 severe obesity with serious comorbidity and body mass index (BMI) of 38.0 to 38.9 in adult ([employer]) 09/13/2006   ANXIETY STATE NOS 09/13/2006   DISORDER, DEPRESSIVE NEC 09/13/2006   CARPAL TUNNEL SYNDROME, MILD 09/13/2006   Unspecified essential hypertension 09/13/2006   IBS 09/13/2006   DEGENERATION, LUMBAR/LUMBOSACRAL DISC 09/13/2006   SYMPTOM, SWELLING/MASS/LUMP IN CHEST 09/13/2006   PULMONARY EMBOLISM, HX OF 09/13/2006   PCP:  Sonia Side., FNP Pharmacy:   [id number] [place] [address]Phone: [phone]: [phone]    Social Determinants of Health (SDOH) Interventions    Readmission Risk Interventions     No data to display

## 2022-03-27 NOTE — Progress Notes (Signed)
Mobility Specialist Progress Note    03/27/22 1033  Mobility  Activity Ambulated with assistance in hallway  Level of Assistance Contact guard assist, steadying assist  Assistive Device Four wheel walker  Distance Ambulated (ft) 90 ft (10+80)  Activity Response Tolerated fair  Mobility Referral Yes  $Mobility charge 1 Mobility   Pre-Mobility: 83 HR During Mobility: 94 HR Post-Mobility: 86 HR, 143/85 (99) BP  Pt received in BR and agreeable. Completed x3 STS. C/o lightheadedness during. Returned to chair with call bell in reach.   Hildred Alamin Mobility Specialist  Please Psychologist, sport and exercise or Rehab Office at [phone]

## 2022-03-28 ENCOUNTER — Other Ambulatory Visit (HOSPITAL_COMMUNITY): Payer: Self-pay

## 2022-03-28 ENCOUNTER — Encounter: Payer: Self-pay | Admitting: Hematology and Oncology

## 2022-03-28 LAB — BASIC METABOLIC PANEL
Anion gap: 8 (ref 5–15)
BUN: 21 mg/dL (ref 8–23)
CO2: 28 mmol/L (ref 22–32)
Calcium: 9.3 mg/dL (ref 8.9–10.3)
Chloride: 101 mmol/L (ref 98–111)
Creatinine, Ser: 0.94 mg/dL (ref 0.44–1.00)
GFR, Estimated: >60 mL/min (ref >60)
Glucose, Bld: 154 mg/dL — ABNORMAL HIGH (ref 70–99)
Potassium: 3.8 mmol/L (ref 3.5–5.1)
Sodium: 137 mmol/L (ref 135–145)

## 2022-03-28 LAB — GLUCOSE, CAPILLARY
Glucose-Capillary: 133 mg/dL — ABNORMAL HIGH (ref 70–99)
Glucose-Capillary: 158 mg/dL — ABNORMAL HIGH (ref 70–99)

## 2022-03-28 MED ORDER — FUROSEMIDE 20 MG PO TABS: 20.0000 mg | ORAL_TABLET | Freq: Every day | ORAL | Status: DC | PRN

## 2022-03-28 MED ORDER — EMPAGLIFLOZIN 10 MG PO TABS
10.0000 mg | ORAL_TABLET | Freq: Every day | ORAL | 0 refills | Status: AC
  Filled 2022-03-28: qty 30, 30d supply, fill #0

## 2022-03-28 MED ORDER — LOSARTAN POTASSIUM 25 MG PO TABS
25.0000 mg | ORAL_TABLET | Freq: Every day | ORAL | 0 refills | Status: DC
  Filled 2022-03-28: qty 30, 30d supply, fill #0

## 2022-03-28 MED ORDER — FUROSEMIDE 40 MG PO TABS
40.0000 mg | ORAL_TABLET | Freq: Every day | ORAL | 0 refills | Status: AC | PRN
  Filled 2022-03-28: qty 30, 30d supply, fill #0

## 2022-03-28 MED ORDER — FUROSEMIDE 20 MG PO TABS
40.0000 mg | ORAL_TABLET | Freq: Every day | ORAL | 0 refills | Status: DC | PRN
  Filled 2022-03-28: qty 30, 15d supply, fill #0

## 2022-03-28 MED ORDER — SPIRONOLACTONE 25 MG PO TABS
12.5000 mg | ORAL_TABLET | Freq: Every day | ORAL | 0 refills | Status: DC
  Filled 2022-03-28: qty 15, 30d supply, fill #0

## 2022-03-28 MED ORDER — LOSARTAN POTASSIUM 25 MG PO TABS
25.0000 mg | ORAL_TABLET | Freq: Every day | ORAL | Status: DC
  Administered 2022-03-28: 25 mg via ORAL
  Filled 2022-03-28: qty 1

## 2022-03-28 NOTE — Progress Notes (Signed)
Physical Therapy Treatment Patient Details Name: [name] MRN: [id number] DOB: 07-13-1960 Today's Date: 03/28/2022   History of Present Illness Pt is a 61 y.o. female admitted 03/23/2022 with complaints of chest pain. Workup for CHF exacerbation, volume overload. PMH includes breast CA in remission (completed chemo 03/03/2022, plans for radiation), DM2, HTN, HLD, chronic anxiety/depression, severe morbid obesity, PE.   PT Comments    Pt progressing with mobility, preparing for d/c home today. Pt able to transfer and ambulate without DME, though limited by fatigue and back pain; plans to use rollator for added energy conservation and stability at home. Reviewed education, pt reports no further questions or concerns. If pt to remain admitted, will continue to follow acutely to address established goals.     Recommendations for follow up therapy are one component of a multi-disciplinary discharge planning process, led by the attending physician.  Recommendations may be updated based on patient status, additional functional criteria and insurance authorization.  Follow Up Recommendations  Home health PT     Assistance Recommended at Discharge PRN  Patient can return home with the following Assistance with cooking/housework;Assist for transportation;Help with stairs or ramp for entrance   Equipment Recommendations  Rollator (4 wheels)    Recommendations for Other Services  Mobility Specialist     Precautions / Restrictions Precautions Precautions: Fall Restrictions Weight Bearing Restrictions: No     Mobility  Bed Mobility               General bed mobility comments: received sitting in recliner    Transfers Overall transfer level: Modified independent Equipment used: None Transfers: Sit to/from Stand, Bed to chair/wheelchair/BSC Sit to Stand: Modified independent (Device/Increase time)   Step pivot transfers: Modified independent (Device/Increase time)        General transfer comment: multiple sit<>stands from recliner without DME, mod indep with step pivot from BSC<>recliner    Ambulation/Gait Ambulation/Gait assistance: Min guard, Supervision Gait Distance (Feet): 60 Feet Assistive device: 1 person hand held assist, None Gait Pattern/deviations: Step-through pattern, Decreased stride length, Trunk flexed Gait velocity: Decreased     General Gait Details: slow, mostly steady gait without DME, initial HHA for stability progressing to no UE support, pt at times reaching for furniture/rail to rest; distance limited by c/o fatigue and back pain   Stairs             Wheelchair Mobility    Modified Rankin (Stroke Patients Only)       Balance Overall balance assessment: Needs assistance Sitting-balance support: No upper extremity supported Sitting balance-Leahy Scale: Good Sitting balance - Comments: indep with pericare and donning/doffing depends sitting on BSC   Standing balance support: No upper extremity supported, During functional activity Standing balance-Leahy Scale: Fair Standing balance comment: can ambulate without DME, preference for UE support to stabilize. standing ADL task at sink, preference to lean on sink to rest                            Cognition Arousal/Alertness: Awake/alert Behavior During Therapy: WFL for tasks assessed/performed Overall Cognitive Status: Within Functional Limits for tasks assessed                                          Exercises      General Comments General comments (skin integrity, edema, etc.): pt preparing for  d/c home today, plans for rollator. educ re: activity recommendations, DVT prevention, fall risk reduction, energy conservation, importance of mobility      Pertinent Vitals/Pain Pain Assessment Pain Assessment: Faces Faces Pain Scale: Hurts little more Pain Location: back into LE Pain Descriptors / Indicators: Discomfort Pain  Intervention(s): Monitored during session, Limited activity within patient's tolerance    Home Living                          Prior Function            PT Goals (current goals can now be found in the care plan section) Progress towards PT goals: Progressing toward goals    Frequency    Min 3X/week      PT Plan Current plan remains appropriate    Co-evaluation              AM-PAC PT "6 Clicks" Mobility   Outcome Measure  Help needed turning from your back to your side while in a flat bed without using bedrails?: None Help needed moving from lying on your back to sitting on the side of a flat bed without using bedrails?: None Help needed moving to and from a bed to a chair (including a wheelchair)?: None Help needed standing up from a chair using your arms (e.g., wheelchair or bedside chair)?: None Help needed to walk in hospital room?: A Little Help needed climbing 3-5 steps with a railing? : A Little 6 Click Score: 22    End of Session   Activity Tolerance: Patient tolerated treatment well;Patient limited by fatigue Patient left: in chair;with call bell/phone within reach Nurse Communication: Mobility status PT Visit Diagnosis: Unsteadiness on feet (R26.81);Difficulty in walking, not elsewhere classified (R26.2)     Time: 4132-4401 PT Time Calculation (min) (ACUTE ONLY): 16 min  Charges:  $Therapeutic Activity: 8-22 mins                     [name], PT, DPT Acute Rehabilitation Services  Personal: [place] [facility] Office: [place] 03/28/2022, 10:57 AM

## 2022-03-28 NOTE — Progress Notes (Signed)
CSW notified by RN that pt requesting food bank/pantry resources. CSW provided resource list. Pt is appreciative. She does not have any additional concerns or questions.

## 2022-03-28 NOTE — Care Management Important Message (Signed)
Important Message  Patient Details  Name: [name] MRN: [id number] Date of Birth: 10/09/1960   Medicare Important Message Given:  Yes     Orbie Pyo 03/28/2022, 2:35 PM

## 2022-03-28 NOTE — Discharge Summary (Signed)
Physician Discharge Summary   Patient: [name] MRN: [id number] DOB: November 05, 1960  Admit date:     03/23/2022  Discharge date: 03/28/22  Discharge Physician: Jimmy Picket Josha Weekley   PCP: Sonia Side., FNP   Recommendations at discharge:    Added spironolactone, empagliflozin Continue with furosemide 40 mg as needed for signs of volume overload, weight gain 2 to 3 lbs in 24 hrs or 5 lbs in 7 days, lower extremity edema or dyspnea.  Change lisinopril for losartan.  Follow up renal function and electrolytes in 7 days. Follow up with right axillary fluid collection and thyroid mass.  Follow up with Dustin Folks FNP in 7 to 10 days.  Follow up with cardiology as scheduled.   Discharge Diagnoses: Principal Problem:   Acute on chronic diastolic CHF (congestive heart failure) (HCC) Active Problems:   HTN (hypertension)   AKI (acute kidney injury) ([place])   Type 2 diabetes mellitus with hyperlipidemia (HCC)   DEGENERATION, LUMBAR/LUMBOSACRAL DISC   Depression   Class 3 severe obesity with serious comorbidity and body mass index (BMI) of 40.0 to 44.9 in adult [facility])   Malignant neoplasm of upper-outer quadrant of female breast ([place])  Resolved Problems:   * No resolved hospital problems. [facility] Course: [name] was admitted to the hospital with the working diagnosis of chest pain in the setting of volume overload.   61 yo female with the past medical history of breast cancer on remission, completed chemotherapy 02/2022, T2DM, hypertension, dyslipidemia, GERD, history of pulmonary embolism, depression and obesity class 3 who presented with chest pain. Reported left shoulder pain that radiated to her chest, pressure like, severe in intensity, prompting her to come to the ED. Her chest pain occurred while she was in bed. She had lower extremity edema for several weeks, having difficulty ambulating and using a walker for mobility.  On her initial physical examination her  blood pressure was 114/64, HR 81, RR 26 and 02 saturation 98% on room air, heart with S1 and S2 present and rhythmic with no gallops, rubs or murmurs, respiratory with no rales, rhonchi or wheezing, abdomen with no distention, positive lower extremity edema.  NA 141, K 3,0, CL 109, bicarbonate 25, glucose 151 bun 12 cr 0,85  Mg 1.6  High sensitive troponin 9 and 8  Wbc 4,3 hgb 9,1 plt 184  Sars covid 19 negative   CT chest with no arterial thrombus, bilateral faint ground glass opacities.  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 Stable 1.6 to 3 cm pre-vascular soft tissue mass likely extension of thyroid mass. 3.3 x 1.,7 cm right axillary fluid collection which was previously larger.  Surgical clips in the upper inner right breast.   EKG 82 bpm, normal axis, 1st degree AV block, sinus rhythm with q wave lead I and AvL, poor R R wave progression, with no significant ST segment or T wave changes.   12/02 patient placed on furosemide for diuresis with improvement in volume status but not back to baseline.  12/03 responding well to diuresis.  12/04 volume status has improved, pending renal function to be more stable.  12/05 patient clinically euvolemic, her renal function has improved, she will follow up as outpatient.   Assessment and Plan: * Acute on chronic diastolic CHF (congestive heart failure) (HCC) Echocardiogram with preserved LV systolic function, EF 60 to 65%, no wall motion abnormalities, RV systolic function preserved, no significant valvular disease.   Patient was placed on  furosemide for diuresis, negative fluid balance was achieved, -11,361 ml, with significant improvement in her symptoms.   Continue diuresis with spironolactone and empagliflozin.  Furosemide as needed for signs of volume overload.  Continue with carvedilol and losartan. Discontinue isosorbide.   HTN (hypertension) Continue blood pressure metoprolol and losartan. Diuresis with  spironolactone and empagliflozin.   AKI (acute kidney injury) ([place]) Hypokalemia.   Renal function has improved, at the time of her discharge her serum cr is 0,94 with K at 3,8 and serum bicarbonate at 28. Plan to continue spironolactone and empagliflozin. Use furosemide as needed for volume overload.   Type 2 diabetes mellitus with hyperlipidemia (HCC) Patient was placed on insulin sliding scale for glucose cover and monitoring during her hospitalization.  Her glucose remained well controlled, her fasting glucose this am is 154 mg/dl.   Continue with metformin and added SGLT 2 inh.   DEGENERATION, LUMBAR/LUMBOSACRAL DISC Back pain has improved, patient will continue physical therapy with home health services.   Depression Continue with buspirone, sertraline and hydroxyzine   Class 3 severe obesity with serious comorbidity and body mass index (BMI) of 40.0 to 44.9 in adult (HCC) Calculated BMI is 42.4  Malignant neoplasm of upper-outer quadrant of female breast [facility]) Patient will follow up as outpatient.          Consultants: none  Procedures performed: none   Disposition: Home Diet recommendation:  Cardiac and Carb modified diet DISCHARGE MEDICATION: Allergies as of 03/28/2022       Reactions   Hydrocodone-acetaminophen Itching, Other (See Comments)   confusion   Penicillins Swelling, Other (See Comments)   Has patient had a PCN reaction causing immediate rash, facial/tongue/throat swelling, SOB or lightheadedness with hypotension: YES Has patient had a PCN reaction causing severe rash involving mucus membranes or skin necrosis: NO Has patient had a PCN reaction that required hospitalization NO Has patient had a PCN reaction occurring within the last 10 years: NO If all of the above answers are "NO", then may proceed with Cephalosporin use.   Shellfish Allergy Hives   Latex Rash   Sulfa Antibiotics Diarrhea, Itching, Rash        Medication List     STOP  taking these medications    dexamethasone 4 MG tablet Commonly known as: DECADRON   diclofenac 75 MG EC tablet Commonly known as: VOLTAREN   glipiZIDE 10 MG tablet Commonly known as: GLUCOTROL   isosorbide dinitrate 20 MG tablet Commonly known as: ISORDIL   lisinopril 20 MG tablet Commonly known as: ZESTRIL   Myrbetriq 25 MG Tb24 tablet Generic drug: mirabegron ER   ondansetron 8 MG tablet Commonly known as: Zofran   Ozempic (0.25 or 0.5 MG/DOSE) 2 MG/1.5ML Sopn Generic drug: Semaglutide(0.25 or 0.'5MG'$ /DOS)   potassium chloride 10 MEQ tablet Commonly known as: Klor-Con M10   valACYclovir 1000 MG tablet Commonly known as: VALTREX       TAKE these medications    Accu-Chek Guide test strip Generic drug: glucose blood   acetaminophen 500 MG tablet Commonly known as: TYLENOL Take 1,000 mg by mouth 2 (two) times daily as needed for moderate pain.   albuterol 108 (90 Base) MCG/ACT inhaler Commonly known as: VENTOLIN HFA Inhale 2 puffs into the lungs every 6 (six) hours as needed for wheezing or shortness of breath.   aspirin 81 MG chewable tablet Chew 81 mg by mouth daily.   atorvastatin 40 MG tablet Commonly known as: LIPITOR Take 40 mg by mouth at bedtime.  busPIRone 30 MG tablet Commonly known as: BUSPAR Take 30 mg by mouth 2 (two) times daily.   carvedilol 6.25 MG tablet Commonly known as: COREG Take 1 tablet (6.25 mg total) by mouth 2 (two) times daily.   diclofenac Sodium 1 % Gel Commonly known as: VOLTAREN Apply 2 g topically 4 (four) times daily as needed (joint/muscle pain).   empagliflozin 10 MG Tabs tablet Commonly known as: JARDIANCE Take 1 tablet (10 mg total) by mouth daily.   EPINEPHrine 0.3 mg/0.3 mL Soaj injection Commonly known as: EPI-PEN Inject 0.3 mg into the muscle as needed for anaphylaxis. Follow package instructions as needed for severe allergy or anaphylactic reaction.   furosemide 40 MG tablet Commonly known as:  LASIX Take 1 tablet (40 mg total) by mouth daily as needed for edema or fluid (take in case of weight gain 2 to 3 lbs in 24 hrs or 5 lbs in 7 days.). What changed:  how much to take when to take this reasons to take this   hydrOXYzine 25 MG capsule Commonly known as: VISTARIL Take 25 mg by mouth 3 (three) times daily as needed for itching.   lidocaine 5 % Commonly known as: LIDODERM Place 1 patch onto the skin daily as needed for pain.   lidocaine-prilocaine cream Commonly known as: EMLA Apply to affected area once What changed:  how much to take how to take this when to take this   loperamide 2 MG capsule Commonly known as: IMODIUM Take 1 capsule (2 mg total) by mouth 4 (four) times daily as needed for diarrhea or loose stools.   losartan 25 MG tablet Commonly known as: COZAAR Take 1 tablet (25 mg total) by mouth daily.   metFORMIN 500 MG tablet Commonly known as: GLUCOPHAGE Take 1 tablet (500 mg total) by mouth daily with breakfast.   nystatin powder Commonly known as: MYCOSTATIN/NYSTOP Apply 1 application topically 3 (three) times daily as needed for rash.   ondansetron 4 MG disintegrating tablet Commonly known as: ZOFRAN-ODT Take 1 tablet (4 mg total) by mouth every 8 (eight) hours as needed for nausea or vomiting.   oxyCODONE 5 MG immediate release tablet Commonly known as: Oxy IR/ROXICODONE Take 1 tablet (5 mg total) by mouth every 6 (six) hours as needed for severe pain.   pantoprazole 40 MG tablet Commonly known as: PROTONIX Take 1 tablet (40 mg total) by mouth 2 (two) times daily. What changed: when to take this   prochlorperazine 10 MG tablet Commonly known as: COMPAZINE Take 1 tablet (10 mg total) by mouth every 6 (six) hours as needed for nausea or vomiting.   sertraline 25 MG tablet Commonly known as: ZOLOFT Take 1 tablet (25 mg total) by mouth daily.   spironolactone 25 MG tablet Commonly known as: ALDACTONE Take 0.5 tablets (12.5 mg total)  by mouth daily.   Vitamin D (Ergocalciferol) 1.25 MG (50000 UNIT) Caps capsule Commonly known as: DRISDOL Take 1 capsule (50,000 Units total) by mouth every 7 (seven) days.               Durable Medical Equipment  (From admission, onward)           Start     Ordered   03/27/22 0857  For home use only DME 4 wheeled rolling walker with seat  Once       Comments: Rollator (4 wheels) (bariatric)  Ht 4'11", wt 95.3kg  Question:  Patient needs a walker to treat with the following condition  Answer:  Weakness   03/27/22 0857            Discharge Exam: Filed Weights   03/23/22 2357  Weight: 95.3 kg   BP (!) 122/56 (BP Location: Left Leg)   Pulse 76   Temp 98 F (36.7 C) (Oral)   Resp 15   Ht '4\' 11"'$  (1.499 m)   Wt 95.3 kg   SpO2 95%   BMI 42.41 kg/m   Patient is feeling well, no dyspnea and no chest pain, edema has resolved.  Neurology awake and alert ENT with no pallor Cardiovascular with S1 and S2 present and rhythmic with no gallops, rubs or murmurs Respiratory with no raled or wheezing, no rhonchi Abdomen with no distention No lower extremity edema   Condition at discharge: stable  The results of significant diagnostics from this hospitalization (including imaging, microbiology, ancillary and laboratory) are listed below for reference.   Imaging Studies: ECHOCARDIOGRAM COMPLETE  Result Date: 03/24/2022    ECHOCARDIOGRAM REPORT   Patient Name:   NALAH MACIOCE Date of Exam: 03/24/2022 Medical Rec #:  [id number]           Height:       59.0 in Accession #:    [id number]          Weight:       210.0 lb Date of Birth:  10-20-1960           BSA:          1.883 m Patient Age:    39 years            BP:           99/58 mmHg Patient Gender: F                   HR:           79 bpm. Exam Location:  Inpatient Procedure: 2D Echo, Cardiac Doppler and Color Doppler Indications:    R07.9* Chest pain, unspecified  History:        Patient has prior history of  Echocardiogram examinations, most                 recent 03/09/2021. CHF, TIA, Signs/Symptoms:Dyspnea, Shortness                 of Breath and Chest Pain; Risk Factors:Sleep Apnea,                 Hypertension, Dyslipidemia, GERD and Diabetes.  Sonographer:    Bernadene Person RDCS Referring Phys: [id number] [place]  1. Left ventricular ejection fraction, by estimation, is 60 to 65%. The left ventricle has normal function. The left ventricle has no regional wall motion abnormalities. Left ventricular diastolic parameters were normal.  2. Right ventricular systolic function is normal. The right ventricular size is normal.  3. The mitral valve is normal in structure. Trivial mitral valve regurgitation. No evidence of mitral stenosis.  4. The aortic valve is tricuspid. Aortic valve regurgitation is trivial. No aortic stenosis is present.  5. The inferior vena cava is dilated in size with <50% respiratory variability, suggesting right atrial pressure of 15 mmHg. FINDINGS  Left Ventricle: Left ventricular ejection fraction, by estimation, is 60 to 65%. The left ventricle has normal function. The left ventricle has no regional wall motion abnormalities. The left ventricular internal cavity size was normal in size. There is  no left ventricular hypertrophy. Left ventricular diastolic parameters were normal. Right  Ventricle: The right ventricular size is normal. Right ventricular systolic function is normal. Left Atrium: Left atrial size was normal in size. Right Atrium: Right atrial size was normal in size. Pericardium: There is no evidence of pericardial effusion. Mitral Valve: The mitral valve is normal in structure. Trivial mitral valve regurgitation. No evidence of mitral valve stenosis. Tricuspid Valve: The tricuspid valve is normal in structure. Tricuspid valve regurgitation is trivial. No evidence of tricuspid stenosis. Aortic Valve: The aortic valve is tricuspid. Aortic valve regurgitation is trivial.  No aortic stenosis is present. Pulmonic Valve: The pulmonic valve was normal in structure. Pulmonic valve regurgitation is mild. No evidence of pulmonic stenosis. Aorta: The aortic root is normal in size and structure. Venous: The inferior vena cava is dilated in size with less than 50% respiratory variability, suggesting right atrial pressure of 15 mmHg. IAS/Shunts: No atrial level shunt detected by color flow Doppler.  LEFT VENTRICLE PLAX 2D LVIDd:         4.60 cm     Diastology LVIDs:         2.80 cm     LV e' medial:    6.66 cm/s LV PW:         1.00 cm     LV E/e' medial:  23.4 LV IVS:        1.00 cm     LV e' lateral:   9.90 cm/s LVOT diam:     1.90 cm     LV E/e' lateral: 15.8 LV SV:         76 LV SV Index:   41 LVOT Area:     2.84 cm  LV Volumes (MOD) LV vol d, MOD A2C: 93.1 ml LV vol d, MOD A4C: 73.5 ml LV vol s, MOD A2C: 33.5 ml LV vol s, MOD A4C: 28.7 ml LV SV MOD A2C:     59.6 ml LV SV MOD A4C:     73.5 ml LV SV MOD BP:      52.7 ml RIGHT VENTRICLE RV S prime:     12.00 cm/s TAPSE (M-mode): 2.5 cm LEFT ATRIUM             Index        RIGHT ATRIUM           Index LA diam:        3.80 cm 2.02 cm/m   RA Area:     17.30 cm LA Vol (A2C):   28.6 ml 15.19 ml/m  RA Volume:   48.20 ml  25.60 ml/m LA Vol (A4C):   35.3 ml 18.75 ml/m LA Biplane Vol: 34.7 ml 18.43 ml/m  AORTIC VALVE LVOT Vmax:   122.00 cm/s LVOT Vmean:  84.300 cm/s LVOT VTI:    0.269 m  AORTA Ao Root diam: 3.00 cm Ao Asc diam:  3.20 cm MITRAL VALVE MV Area (PHT): 4.68 cm     SHUNTS MV Decel Time: 162 msec     Systemic VTI:  0.27 m MV E velocity: 156.00 cm/s  Systemic Diam: 1.90 cm MV A velocity: 77.10 cm/s MV E/A ratio:  2.02 Kirk Ruths MD Electronically signed by Kirk Ruths MD Signature Date/Time: 03/24/2022/12:02:42 PM    Final    CT Angio Chest PE W and/or Wo Contrast  Result Date: 03/23/2022 CLINICAL DATA:  Chest pain, shortness of breath and left upper extremity pain. Ongoing worsening symptoms for a few weeks. Most recently  the patient has a history of right breast cancer with  lumpectomy and radioactive seed implant 11/16/2021. EXAM: CT ANGIOGRAPHY CHEST WITH CONTRAST TECHNIQUE: Multidetector CT imaging of the chest was performed using the standard protocol during bolus administration of intravenous contrast. Multiplanar CT image reconstructions and MIPs were obtained to evaluate the vascular anatomy. RADIATION DOSE REDUCTION: This exam was performed according to the departmental dose-optimization program which includes automated exposure control, adjustment of the mA and/or kV according to patient size and/or use of iterative reconstruction technique. CONTRAST:  49m OMNIPAQUE IOHEXOL 350 MG/ML SOLN COMPARISON:  PA Lat chest 01/06/2022, PA Lat chest 12/06/2021. Numerous prior CTs. The 2 most recent are CTA chest 11/29/2021, CTA chest 09/27/2021. FINDINGS: Cardiovascular: Mildly prominent pulmonary trunk 3.4 cm chronically noted indicating arterial hypertension. No embolus is seen through the segmental arteries. The subsegmental arterial bed is largely unopacified due to bolus timing and not evaluated. There is mild cardiomegaly with a left chamber predominance. Small chronic pericardial fluid collects to the right, unchanged. There is mild aortic atherosclerosis without aneurysm, stenosis or dissection. The great vessels are clear. Pulmonary veins are decompressed. There is no visible coronary calcific plaque. Mediastinum/Nodes: Heterogeneous enlarged right thyroid lobe with 4 cm lower pole mass. This has been previously biopsied. This has been most recently evaluated on ultrasound 06/09/2021. There is stable 1.6 x 3 cm well-circumscribed ovoid prevascular soft tissue mass which has been noted on multiple prior studies and which is believed to be a substernal extension of the above-described right lower pole thyroid mass. No intrathoracic or axillary adenopathy is seen. No tracheal lesion. There is no significant esophageal  thickening. There is a right axillary fluid collection measuring 3.3 x 1.7 cm on 5:38 which was previously about 3 times this size. Surgical clips are again noted in the upper inner right breast with interval resolution of the adjacent fluid collection noted on the last CT. Lungs/Pleura: Some respiratory motion artifact is seen on exam. There is mild dependent atelectasis. No confluent infiltrate is seen. There no appreciable pulmonary nodule visible through the motion artifact. There is no pleural effusion, thickening or pneumothorax. Upper Abdomen: No acute abnormality. Musculoskeletal: Mild thoracic kyphodextroscoliosis and degenerative changes of the spine. No destructive or lytic lesions are seen in the thorax. The ribcage is intact. Review of the MIP images confirms the above findings. IMPRESSION: 1. No arterial embolus is seen through the segmental arteries. The subsegmental arterial bed is largely unopacified due to bolus timing or obscured by breathing motion and not evaluated. 2. Cardiomegaly with left chamber predominance. No pulmonary edema or pleural effusion. 3. Aortic atherosclerosis. 4. 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 5. Stable 1.6 x 3 cm prevascular soft tissue mass which is believed to be a substernal extension of the right lower pole thyroid mass. 6. 3.3 x 1.7 cm right axillary fluid collection which was previously much larger. 7. Surgical clips in the upper inner right breast with interval resolution of the adjacent fluid collection noted on the last CT. 8. Mild kyphodextroscoliosis and degenerative change of the spine. Aortic Atherosclerosis (ICD10-I70.0). Electronically Signed   By: KTelford NabM.D.   On: 03/23/2022 23:31   CT Abdomen Pelvis W Contrast  Result Date: 03/15/2022 CLINICAL DATA:  Lymphadenopathy.  Personal history of breast cancer. EXAM: CT ABDOMEN AND PELVIS WITH CONTRAST TECHNIQUE: Multidetector CT imaging of the abdomen and pelvis was performed  using the standard protocol following bolus administration of intravenous contrast. RADIATION DOSE REDUCTION: This exam was performed according to the departmental dose-optimization program which includes automated exposure  control, adjustment of the mA and/or kV according to patient size and/or use of iterative reconstruction technique. CONTRAST:  179m OMNIPAQUE IOHEXOL 300 MG/ML  SOLN COMPARISON:  CT of the abdomen and pelvis 11/29/2021 FINDINGS: Lower chest: The lung bases are clear without focal nodule, mass, or airspace disease. Heart size is normal. No significant pleural or pericardial effusion is present. Hepatobiliary: No focal liver abnormality is seen. No gallstones, gallbladder wall thickening, or biliary dilatation. Pancreas: Unremarkable. No pancreatic ductal dilatation or surrounding inflammatory changes. Spleen: Normal in size without focal abnormality. Adrenals/Urinary Tract: Adrenal glands are unremarkable. Kidneys are normal, without renal calculi, focal lesion, or hydronephrosis. Bladder is unremarkable. Stomach/Bowel: Stomach is within normal limits. Appendix is surgically absent. No evidence of bowel wall thickening, distention, or inflammatory changes. Vascular/Lymphatic: No significant vascular findings are present. No enlarged abdominal or pelvic lymph nodes. Bilateral inguinal lymph nodes are slightly more prominent than on the prior study. The largest short axis node in the right groin is 12 mm. The largest short axis node in the left groin is 12 mm. Nodes are ovoid. No necrotic nodes are present. Reproductive: Status post hysterectomy. No adnexal masses. Other: No abdominal wall hernia or abnormality. No abdominopelvic ascites. Musculoskeletal: Left total hip arthroplasty noted. Grade 1 degenerative anterolisthesis at L4-5 is stable. No focal osseous lesions are present. Facet degenerative changes are worse left than right at L4-5 and L5-S1. IMPRESSION: 1. Bilateral inguinal lymph nodes  are slightly more prominent than on the prior study. The largest short axis node in the right groin is 12 mm. The largest short axis node in the left groin is 12 mm. No necrotic nodes are present. These are likely reactive. 2. No retroperitoneal or abdominal adenopathy to suggest metastatic disease. 3. Status post hysterectomy and appendectomy. 4. Left total hip arthroplasty. 5. Stable grade 1 degenerative anterolisthesis at L4-5 with associated facet degenerative changes. Electronically Signed   By: CSan MorelleM.D.   On: 03/15/2022 10:50   VAS [place]OWER EXTREMITY VENOUS (DVT)  Result Date: 03/06/2022  Lower Venous DVT Study Patient Name:  ALIZZY HAMRE Date of Exam:   03/06/2022 Medical Rec #: [id number]           Accession #:    [id number] of Birth: 602/06/1960           Patient Gender: F Patient Age:   89years [facility]      VAS [place]OWER EXTREMITY VENOUS (DVT) Referring Phys: PArletha PiliIRUKU --------------------------------------------------------------------------------  Indications: Swelling.  Risk Factors: Cancer. Limitations: Poor ultrasound/tissue interface. Comparison Study: No prior studies. Performing Technologist: GOliver HumRVT  Examination Guidelines: A complete evaluation includes B-mode imaging, spectral Doppler, color Doppler, and power Doppler as needed of all accessible portions of each vessel. Bilateral testing is considered an integral part of a complete examination. Limited examinations for reoccurring indications may be performed as noted. The reflux portion of the exam is performed with the patient in reverse Trendelenburg.  +-----+---------------+---------+-----------+----------+--------------+ RIGHTCompressibilityPhasicitySpontaneityPropertiesThrombus Aging +-----+---------------+---------+-----------+----------+--------------+ CFV  Full           Yes      Yes                                  +-----+---------------+---------+-----------+----------+--------------+   +---------+---------------+---------+-----------+----------+--------------+ LEFT     CompressibilityPhasicitySpontaneityPropertiesThrombus Aging +---------+---------------+---------+-----------+----------+--------------+ CFV      Full  Yes      Yes                                 +---------+---------------+---------+-----------+----------+--------------+ SFJ      Full                                                        +---------+---------------+---------+-----------+----------+--------------+ FV Prox  Full                                                        +---------+---------------+---------+-----------+----------+--------------+ FV Mid   Full                                                        +---------+---------------+---------+-----------+----------+--------------+ FV DistalFull                                                        +---------+---------------+---------+-----------+----------+--------------+ PFV      Full                                                        +---------+---------------+---------+-----------+----------+--------------+ POP      Full           Yes      Yes                                 +---------+---------------+---------+-----------+----------+--------------+ PTV      Full                                                        +---------+---------------+---------+-----------+----------+--------------+ PERO     Full                                                        +---------+---------------+---------+-----------+----------+--------------+     Summary: RIGHT: - No evidence of common femoral vein obstruction.  LEFT: - There is no evidence of deep vein thrombosis in the lower extremity. However, portions of this examination were limited- see technologist comments above.  - No cystic structure found in the popliteal  fossa.  *See table(s) above for measurements and observations. Electronically signed by Servando Snare MD on 03/06/2022 at 6:07:10 PM.  Final    VAS [name] LOWER EXTREMITY VENOUS (DVT) (ONLY [facility] & [facility])  Result Date: 02/27/2022  Lower Venous DVT Study Patient Name:  THREASA KINCH  Date of Exam:   02/26/2022 Medical Rec #: [id number]            Accession #:    [id number] Date of Birth: October 01, 1960            Patient Gender: F Patient Age:   25 years Exam Location:  [facility] Procedure:      VAS [name] LOWER EXTREMITY VENOUS (DVT) Referring Phys: LESLIE SOFIA --------------------------------------------------------------------------------  Indications: Pain, and Swelling.  Risk Factors: Chemotherapy. Limitations: Poor ultrasound/tissue interface. Comparison Study: Previous exams on 03/07/21, 05/19/20, 08/18/19, and 08/14/17 all                   negative for DVT Performing Technologist: Rogelia Rohrer RVT, RDMS  Examination Guidelines: A complete evaluation includes B-mode imaging, spectral Doppler, color Doppler, and power Doppler as needed of all accessible portions of each vessel. Bilateral testing is considered an integral part of a complete examination. Limited examinations for reoccurring indications may be performed as noted. The reflux portion of the exam is performed with the patient in reverse Trendelenburg.  +---------+---------------+---------+-----------+----------+--------------+ RIGHT    CompressibilityPhasicitySpontaneityPropertiesThrombus Aging +---------+---------------+---------+-----------+----------+--------------+ CFV      Full           Yes      Yes                                 +---------+---------------+---------+-----------+----------+--------------+ SFJ      Full                                                        +---------+---------------+---------+-----------+----------+--------------+ FV Prox  Full           Yes      Yes                                  +---------+---------------+---------+-----------+----------+--------------+ FV Mid   Full           Yes      Yes                                 +---------+---------------+---------+-----------+----------+--------------+ FV DistalFull           Yes      Yes                                 +---------+---------------+---------+-----------+----------+--------------+ PFV      Full                                                        +---------+---------------+---------+-----------+----------+--------------+ POP      Full                                                        +---------+---------------+---------+-----------+----------+--------------+  PTV      Full                                                        +---------+---------------+---------+-----------+----------+--------------+ PERO     Full                                                        +---------+---------------+---------+-----------+----------+--------------+   +----+---------------+---------+-----------+----------+--------------+ LEFTCompressibilityPhasicitySpontaneityPropertiesThrombus Aging +----+---------------+---------+-----------+----------+--------------+ CFV Full           Yes      Yes                                 +----+---------------+---------+-----------+----------+--------------+    Summary: RIGHT: - There is no evidence of deep vein thrombosis in the lower extremity.  - No cystic structure found in the popliteal fossa. - Ultrasound characteristics of enlarged lymph nodes are noted in the groin. Subcutaneous edema noted in area of calf and ankle  LEFT: - No evidence of common femoral vein obstruction. - Ultrasound characteristics of enlarged lymph nodes noted in the groin.  *See table(s) above for measurements and observations. Electronically signed by Servando Snare MD on 02/27/2022 at 6:04:43 PM.    Final     Microbiology: Results for orders placed or performed during  the hospital encounter of 03/23/22  Resp Panel by RT-PCR (Flu A&B, Covid) Anterior Nasal Swab     Status: None   Collection Time: 03/24/22  1:06 AM   Specimen: Anterior Nasal Swab  Result Value Ref Range Status   SARS Coronavirus 2 by RT PCR NEGATIVE NEGATIVE Final    Comment: (NOTE) SARS-CoV-2 target nucleic acids are NOT DETECTED.  The SARS-CoV-2 RNA is generally detectable in upper respiratory specimens during the acute phase of infection. The lowest concentration of SARS-CoV-2 viral copies this assay can detect is 138 copies/mL. A negative result does not preclude SARS-Cov-2 infection and should not be used as the sole basis for treatment or other patient management decisions. A negative result may occur with  improper specimen collection/handling, submission of specimen other than nasopharyngeal swab, presence of viral mutation(s) within the areas targeted by this assay, and inadequate number of viral copies(<138 copies/mL). A negative result must be combined with clinical observations, patient history, and epidemiological information. The expected result is Negative.  Fact Sheet for Patients:  EntrepreneurPulse.com.au  Fact Sheet for Healthcare Providers:  IncredibleEmployment.be  This test is no t yet approved or cleared by the [place] FDA and  has been authorized for detection and/or diagnosis of SARS-CoV-2 by FDA under an Emergency Use Authorization (EUA). This EUA will remain  in effect (meaning this test can be used) for the duration of the COVID-19 declaration under Section 564(b)(1) of the Act, 21 U.S.C.section 360bbb-3(b)(1), unless the authorization is terminated  or revoked sooner.       Influenza A by PCR NEGATIVE NEGATIVE Final   Influenza B by PCR NEGATIVE NEGATIVE Final    Comment: (NOTE) The Xpert Xpress SARS-CoV-2/FLU/RSV plus assay is intended as an aid in the diagnosis of influenza from Nasopharyngeal swab  specimens and should  not be used as a sole basis for treatment. Nasal washings and aspirates are unacceptable for Xpert Xpress SARS-CoV-2/FLU/RSV testing.  Fact Sheet for Patients: EntrepreneurPulse.com.au  Fact Sheet for Healthcare Providers: IncredibleEmployment.be  This test is not yet approved or cleared by the [place] FDA and has been authorized for detection and/or diagnosis of SARS-CoV-2 by FDA under an Emergency Use Authorization (EUA). This EUA will remain in effect (meaning this test can be used) for the duration of the COVID-19 declaration under Section 564(b)(1) of the Act, 21 U.S.C. section 360bbb-3(b)(1), unless the authorization is terminated or revoked.  Performed at [facility], [place] [address], [place], [place] [address]     Labs: CBC: Recent Labs  Lab 03/24/22 0034  WBC 4.3  NEUTROABS 1.6*  HGB 9.1*  HCT 29.5*  MCV 96.4  PLT 825   Basic Metabolic Panel: Recent Labs  Lab 03/24/22 0229 03/24/22 0522 03/25/22 0241 03/26/22 0144 03/27/22 0320 03/28/22 0440  NA 141 141 144 141 139 137  K 3.0* 3.3* 3.5 3.4* 4.1 3.8  CL 109 107 108 105 102 101  CO2 '25 26 28 29 29 28  '$ GLUCOSE 151* 203* 131* 135* 132* 154*  BUN '12 11 14 17 19 21  '$ CREATININE 0.85 0.88 1.05* 0.99 1.21* 0.94  CALCIUM 8.6* 8.7* 8.8* 8.8* 9.0 9.3  MG 1.6*  --  2.2 2.0 2.2  --   PHOS 4.7*  --   --   --   --   --    Liver Function Tests: Recent Labs  Lab 03/23/22 1928 03/24/22 0229 03/24/22 0522  AST '23 19 23  '$ ALT '23 21 23  '$ ALKPHOS 52 47 47  BILITOT 0.4 0.2* 0.3  PROT 5.3* 5.3* 4.9*  ALBUMIN 3.2* 3.0* 3.0*   CBG: Recent Labs  Lab 03/27/22 0633 03/27/22 1154 03/27/22 1615 03/27/22 2129 03/28/22 0644  GLUCAP 136* 167* 102* 150* 133*    Discharge time spent: greater than 30 minutes.  Signed: Tawni Millers, MD [facility] 03/28/2022

## 2022-03-30 ENCOUNTER — Ambulatory Visit: Payer: Medicare Other | Admitting: Radiation Oncology

## 2022-03-30 ENCOUNTER — Ambulatory Visit
Admission: RE | Admit: 2022-03-30 | Discharge: 2022-03-30 | Disposition: A | Discharge status: Home / Self Care | Payer: Medicare Other | Source: Ambulatory Visit | Attending: Radiation Oncology | Admitting: Radiation Oncology

## 2022-03-30 ENCOUNTER — Encounter: Payer: Self-pay | Admitting: *Deleted

## 2022-03-30 DIAGNOSIS — C50411 Malignant neoplasm of upper-outer quadrant of right female breast: Secondary | ICD-10-CM | POA: Insufficient documentation

## 2022-03-30 DIAGNOSIS — Z171 Estrogen receptor negative status [ER-]: Secondary | ICD-10-CM | POA: Insufficient documentation

## 2022-03-31 ENCOUNTER — Ambulatory Visit: Payer: Medicare Other

## 2022-03-31 ENCOUNTER — Ambulatory Visit: Payer: Medicare Other | Admitting: Radiation Oncology

## 2022-03-31 DIAGNOSIS — C50411 Malignant neoplasm of upper-outer quadrant of right female breast: Secondary | ICD-10-CM | POA: Diagnosis present

## 2022-03-31 DIAGNOSIS — Z171 Estrogen receptor negative status [ER-]: Secondary | ICD-10-CM | POA: Diagnosis present

## 2022-04-03 ENCOUNTER — Ambulatory Visit
Admission: RE | Admit: 2022-04-03 | Discharge: 2022-04-03 | Disposition: A | Discharge status: Home / Self Care | Payer: Medicare Other | Source: Ambulatory Visit | Attending: Radiation Oncology | Admitting: Radiation Oncology

## 2022-04-03 ENCOUNTER — Other Ambulatory Visit: Payer: Self-pay

## 2022-04-03 DIAGNOSIS — C50411 Malignant neoplasm of upper-outer quadrant of right female breast: Secondary | ICD-10-CM | POA: Diagnosis not present

## 2022-04-03 LAB — RAD ONC ARIA SESSION SUMMARY
Course Elapsed Days: 0
Plan Fractions Treated to Date: 1
Plan Prescribed Dose Per Fraction: 2.66 Gy
Plan Total Fractions Prescribed: 16
Plan Total Prescribed Dose: 42.56 Gy
Reference Point Dosage Given to Date: 2.66 Gy
Reference Point Session Dosage Given: 2.66 Gy
Session Number: 1

## 2022-04-04 ENCOUNTER — Other Ambulatory Visit: Payer: Self-pay

## 2022-04-04 ENCOUNTER — Ambulatory Visit
Admission: RE | Admit: 2022-04-04 | Discharge: 2022-04-04 | Disposition: A | Discharge status: Home / Self Care | Payer: Medicare Other | Source: Ambulatory Visit | Attending: Radiation Oncology | Admitting: Radiation Oncology

## 2022-04-04 DIAGNOSIS — C50411 Malignant neoplasm of upper-outer quadrant of right female breast: Secondary | ICD-10-CM | POA: Diagnosis not present

## 2022-04-04 LAB — RAD ONC ARIA SESSION SUMMARY
Course Elapsed Days: 1
Plan Fractions Treated to Date: 2
Plan Prescribed Dose Per Fraction: 2.66 Gy
Plan Total Fractions Prescribed: 16
Plan Total Prescribed Dose: 42.56 Gy
Reference Point Dosage Given to Date: 5.32 Gy
Reference Point Session Dosage Given: 2.66 Gy
Session Number: 2

## 2022-04-05 ENCOUNTER — Ambulatory Visit (INDEPENDENT_AMBULATORY_CARE_PROVIDER_SITE_OTHER): Payer: Medicare Other | Admitting: Family Medicine

## 2022-04-05 ENCOUNTER — Other Ambulatory Visit: Payer: Self-pay

## 2022-04-05 ENCOUNTER — Ambulatory Visit
Admission: RE | Admit: 2022-04-05 | Discharge: 2022-04-05 | Disposition: A | Discharge status: Home / Self Care | Payer: Medicare Other | Source: Ambulatory Visit | Attending: Radiation Oncology | Admitting: Radiation Oncology

## 2022-04-05 DIAGNOSIS — C50411 Malignant neoplasm of upper-outer quadrant of right female breast: Secondary | ICD-10-CM | POA: Diagnosis not present

## 2022-04-05 LAB — RAD ONC ARIA SESSION SUMMARY
Course Elapsed Days: 2
Plan Fractions Treated to Date: 3
Plan Prescribed Dose Per Fraction: 2.66 Gy
Plan Total Fractions Prescribed: 16
Plan Total Prescribed Dose: 42.56 Gy
Reference Point Dosage Given to Date: 7.98 Gy
Reference Point Session Dosage Given: 2.66 Gy
Session Number: 3

## 2022-04-06 ENCOUNTER — Other Ambulatory Visit: Payer: Self-pay

## 2022-04-06 ENCOUNTER — Ambulatory Visit
Admission: RE | Admit: 2022-04-06 | Discharge: 2022-04-06 | Disposition: A | Discharge status: Home / Self Care | Payer: Medicare Other | Source: Ambulatory Visit | Attending: Radiation Oncology | Admitting: Radiation Oncology

## 2022-04-06 DIAGNOSIS — C50411 Malignant neoplasm of upper-outer quadrant of right female breast: Secondary | ICD-10-CM | POA: Diagnosis not present

## 2022-04-06 LAB — RAD ONC ARIA SESSION SUMMARY
Course Elapsed Days: 3
Plan Fractions Treated to Date: 4
Plan Prescribed Dose Per Fraction: 2.66 Gy
Plan Total Fractions Prescribed: 16
Plan Total Prescribed Dose: 42.56 Gy
Reference Point Dosage Given to Date: 10.64 Gy
Reference Point Session Dosage Given: 2.66 Gy
Session Number: 4

## 2022-04-07 ENCOUNTER — Other Ambulatory Visit: Payer: Self-pay | Admitting: Radiation Oncology

## 2022-04-07 ENCOUNTER — Ambulatory Visit
Admission: RE | Admit: 2022-04-07 | Discharge: 2022-04-07 | Disposition: A | Discharge status: Home / Self Care | Payer: Medicare Other | Source: Ambulatory Visit | Attending: Radiation Oncology | Admitting: Radiation Oncology

## 2022-04-07 ENCOUNTER — Other Ambulatory Visit: Payer: Self-pay

## 2022-04-07 DIAGNOSIS — C50411 Malignant neoplasm of upper-outer quadrant of right female breast: Secondary | ICD-10-CM

## 2022-04-07 DIAGNOSIS — Z171 Estrogen receptor negative status [ER-]: Secondary | ICD-10-CM

## 2022-04-07 LAB — RAD ONC ARIA SESSION SUMMARY
Course Elapsed Days: 4
Plan Fractions Treated to Date: 5
Plan Prescribed Dose Per Fraction: 2.66 Gy
Plan Total Fractions Prescribed: 16
Plan Total Prescribed Dose: 42.56 Gy
Reference Point Dosage Given to Date: 13.3 Gy
Reference Point Session Dosage Given: 2.66 Gy
Session Number: 5

## 2022-04-07 MED ORDER — RADIAPLEXRX EX GEL: Freq: Once | CUTANEOUS | Status: AC

## 2022-04-07 MED ORDER — ALRA NON-METALLIC DEODORANT (RAD-ONC)
1.0000 | Freq: Once | TOPICAL | Status: AC
  Administered 2022-04-07: 1 via TOPICAL

## 2022-04-07 MED ORDER — OXYCODONE HCL 5 MG PO TABS: 5.0000 mg | ORAL_TABLET | Freq: Four times a day (QID) | ORAL | 0 refills | Status: DC | PRN

## 2022-04-10 ENCOUNTER — Ambulatory Visit
Admission: RE | Admit: 2022-04-10 | Discharge: 2022-04-10 | Disposition: A | Discharge status: Home / Self Care | Payer: Medicare Other | Source: Ambulatory Visit | Attending: Radiation Oncology | Admitting: Radiation Oncology

## 2022-04-10 ENCOUNTER — Other Ambulatory Visit: Payer: Self-pay

## 2022-04-10 DIAGNOSIS — C50411 Malignant neoplasm of upper-outer quadrant of right female breast: Secondary | ICD-10-CM | POA: Diagnosis not present

## 2022-04-10 LAB — RAD ONC ARIA SESSION SUMMARY
Course Elapsed Days: 7
Plan Fractions Treated to Date: 6
Plan Prescribed Dose Per Fraction: 2.66 Gy
Plan Total Fractions Prescribed: 16
Plan Total Prescribed Dose: 42.56 Gy
Reference Point Dosage Given to Date: 15.96 Gy
Reference Point Session Dosage Given: 2.66 Gy
Session Number: 6

## 2022-04-10 NOTE — Progress Notes (Unsigned)
Office Visit    Patient Name: [name] Date of Encounter: 04/11/2022  PCP:  Sonia Side[facility]  Cardiologist:  [name], MD  Advanced Practice Provider:  No care team member to display Electrophysiologist:  None   HPI    [name] is a 61 y.o. female with past medical history significant for breast CAD (newly diagnosed), HFpEF, OSA, remote history of PE no longer on Guys, TAA, HTN, atypical chest pain and HLD presents today for hospital follow-up appointment.  She was originally seen by Dr. Martinique in 2008 and underwent normal LHC with normal LV function.  She was treated with Coumadin in 04/2006 for questionable PE.  Cardiac CTA was completed 01/2010 which showed no PE.  She was seen in the ED 2012 for complaints of chest pain.  She underwent nuclear stress test with Myoview that was found to be low risk, chest pain was likely ruled musculoskeletal etiology.  Patient has subsequent repeat Myoview for chest pain in 10/2012 which was normal.  She was hospitalized 05/2014 with chest pain and left-sided numbness and MRI was completed to rule out stroke which was negative.  She was seen in the ER for chest pain 09/03/2020.  Ruled out with negative tropes.  D-dimer was elevated.  CTA negative for PE.  She was seen in the ED for complaint of chest pain in 02/2021.  Patient reported throbbing dull pain in the center of her chest with associated shortness of breath.  CT was completed that was negative for PE but did reveal possible worsening pericardial effusion.  2D echocardiogram was ordered which showed EF 50 to 55%, no RWMA, mild LVH, grade 2 DD, and normal valve function.  Her troponins were negative x 2 and EKG had nonspecific ST changes.  Patient had relief with Nitropaste and was referred to GI for possible upper endoscopy.  She was seen in the ED 11/2020 with complaint of chest pain or shortness of breath thought to be possible CHF  exacerbation.  BNP was normal and troponins were negative.  The patient was last seen 12/12/2021 and had just been in the ED for possible CHF exacerbation.  She reported not feeling well and denied any current chest discomfort on follow-up.  She has been euvolemic on exam with been compliant with all of her current medications.  Blood pressure 108/70 and reported that they have been on the low side for a few days.  11/30-12/5 She was admitted and started on spiro, jardiance, and losratan. Echo reviewed with normal diastolic and systolic function. She has sciatica which keeps her from doing any significant walking. She tells me that after her last chemo treatment was when the swelling in her legs started. Today, she does has some swelling and left arm pain. She tells me she has gained about 7 lbs since she was in the hospital. No chest pain.   Reports no shortness of breath nor dyspnea on exertion. Reports no chest pain, pressure, or tightness. Reports no palpitations.    Past Medical History    Past Medical History:  Diagnosis Date   Allergy    Anginal pain ([place])    a. NL cath in 2008;  b. Myoview 03/2011: dec uptake along mid anterior wall on stress imaging -> ? attenuation vs. ischemia, EF 65%;  c. 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 2008 -> coumadin x 6 mos.   Rheumatoid arthritis ([place])    Sleep apnea    on CPAP 02/2018   SOB (shortness of breath)    Thyroid disease    TIA (transient ischemic attack)    Urinary urgency    Vitamin D deficiency    Past Surgical History:  Procedure Laterality Date   ABDOMINAL HYSTERECTOMY  2005   APPENDECTOMY     BREAST LUMPECTOMY WITH RADIOACTIVE SEED AND SENTINEL LYMPH NODE BIOPSY Right 11/16/2021   Procedure: RIGHT BREAST RADIOACTIVE SEED LOCALIZED LUMPECTOMY WITH RIGHT AXILLARY SENTINEL LYMPH NODE BIOPSY;  Surgeon: Donnie Mesa, MD;  Location: [facility];  Service: General;  Laterality: Right;  LMA & PEC BLOCK   CARDIAC CATHETERIZATION     Normal   CARDIAC CATHETERIZATION N/A 09/30/2014   Procedure: Left Heart Cath and Coronary Angiography;  Surgeon: Sherren Mocha, MD;  Location: [facility];  Service: Cardiovascular;  Laterality: N/A;   LAPAROSCOPIC APPENDECTOMY N/A 06/03/2012   Procedure: APPENDECTOMY LAPAROSCOPIC;  Surgeon: Stark Klein, MD;  Location: [facility];  Service: General;  Laterality: N/A;   Left knee surgery  2008   LEG SURGERY     TONSILLECTOMY     TOTAL HIP ARTHROPLASTY Left 06/11/2018   Procedure: LEFT TOTAL HIP ARTHROPLASTY ANTERIOR APPROACH;  Surgeon: Meredith Pel, MD;  Location: [facility];  Service: Orthopedics;  Laterality: Left;   TOTAL KNEE ARTHROPLASTY Left 08/22/2016   Procedure: TOTAL KNEE ARTHROPLASTY;  Surgeon: Meredith Pel, MD;  Location: [facility];  Service: Orthopedics;  Laterality: Left;   TUBAL LIGATION  1989    Allergies  Allergies  Allergen Reactions   Hydrocodone-Acetaminophen Itching and Other (See Comments)    confusion   Penicillins Swelling and Other (See Comments)    Has patient had a PCN reaction causing immediate rash, facial/tongue/throat swelling, SOB or lightheadedness with hypotension: YES Has patient had a PCN reaction causing severe rash involving mucus  membranes or skin necrosis: NO Has patient had a PCN reaction that required hospitalization NO Has patient had a PCN reaction occurring within the last 10 years: NO If all of the above answers are "NO", then may proceed with Cephalosporin use.   Shellfish Allergy Hives   Latex Rash   Sulfa Antibiotics Diarrhea, Itching and Rash   EKGs/Labs/Other Studies Reviewed:   The following studies were reviewed today:  03/24/2022 echocardiogram  IMPRESSIONS     1. Left ventricular ejection fraction, by estimation, is 60 to 65%. The  left ventricle has normal function.  The left ventricle has no regional  wall motion abnormalities. Left ventricular diastolic parameters were  normal.   2. Right ventricular systolic function is normal. The right ventricular  size is normal.   3. The mitral valve is normal in structure. Trivial mitral valve  regurgitation. No evidence of mitral stenosis.   4. The aortic valve is tricuspid. Aortic valve regurgitation is trivial.  No aortic stenosis is present.   5. The inferior vena cava is dilated in size with <50% respiratory  variability, suggesting right atrial pressure of 15 mmHg.   FINDINGS   Left Ventricle: Left ventricular ejection fraction, by estimation, is 60  to 65%. The left ventricle has normal function. The left ventricle has no  regional wall motion abnormalities. The left ventricular internal cavity  size was normal in size. There is   no left ventricular hypertrophy. Left ventricular diastolic parameters  were normal.   Right Ventricle: The right ventricular size is normal. Right ventricular  systolic function is normal.   Left Atrium: Left atrial size was normal in size.   Right Atrium: Right atrial size was normal in size.   Pericardium: There is no evidence of pericardial effusion.   Mitral Valve: The mitral valve is normal in structure. Trivial mitral  valve regurgitation. No evidence of mitral valve stenosis.   Tricuspid Valve:  The tricuspid valve is normal in structure. Tricuspid  valve regurgitation is trivial. No evidence of tricuspid stenosis.   Aortic Valve: The aortic valve is tricuspid. Aortic valve regurgitation is  trivial. No aortic stenosis is present.   Pulmonic Valve: The pulmonic valve was normal in structure. Pulmonic valve  regurgitation is mild. No evidence of pulmonic stenosis.   Aorta: The aortic root is normal in size and structure.   Venous: The inferior vena cava is dilated in size with less than 50%  respiratory variability, suggesting right atrial pressure of 15 mmHg.   IAS/Shunts: No atrial level shunt detected by color flow Doppler.   EKG:  EKG is  ordered today.  The ekg ordered today demonstrates NSR with no ST elevation  Recent Labs: 03/23/2022: B Natriuretic Peptide 22.9 03/24/2022: ALT 23; Hemoglobin 9.1; Platelets 184; TSH 0.467 03/27/2022: Magnesium 2.2 03/28/2022: BUN 21; Creatinine, Ser 0.94; Potassium 3.8; Sodium 137  Recent Lipid Panel    Component Value Date/Time   CHOL 127 09/28/2021 0951   TRIG 99 09/28/2021 0951   HDL 48 09/28/2021 0951   CHOLHDL 4.2 04/23/2011 0830   CHOLHDL 4.3 04/23/2011 0830   VLDL 29 04/23/2011 0830   VLDL 29 04/23/2011 0830   LDLCALC 60 09/28/2021 0951    Home Medications   Current Meds  Medication Sig   nitroGLYCERIN (NITROSTAT) 0.4 MG SL tablet Place 1 tablet (0.4 mg total) under the tongue every 5 (five) minutes as needed for chest pain.     Review of Systems      All other systems reviewed and are otherwise negative except as noted above.  Physical Exam    VS:  BP 124/70   Pulse 68   Ht '4\' 11"'$  (1.499 m)   Wt 212 lb 12.8 oz (96.5 kg)   SpO2 98%   BMI 42.98 kg/m  , BMI Body mass index is 42.98 kg/m.  Wt Readings from Last 3 Encounters:  04/11/22 212 lb 12.8 oz (96.5 kg)  03/23/22 210 lb (95.3 kg)  03/23/22 217 lb 8 oz (98.7 kg)     GEN: Well nourished, well developed, in no acute distress.  HEENT: normal. Neck:  Supple, no JVD, carotid bruits, or masses. Cardiac: RRR, no murmurs, rubs, or gallops. No clubbing, cyanosis, 1+ pitting edema bilaterally.  Radials/PT 2+ and equal bilaterally.  Respiratory:  Respirations regular and unlabored, clear to auscultation bilaterally. GI: Soft, nontender, nondistended. MS: No deformity or atrophy. Skin: Warm and dry, no rash. Neuro:  Strength and sensation are intact. Psych: Normal affect.  Assessment & Plan    HFpEF -lasix '40mg'$  for three days and then back to PRN dosing BNP today -check your kidneys next week -low sodium diet, salty 6 handout -lower ext compression -daily weights and if increases by 2 lbs overnight or 5 lbs in a week we need a phone call   Stable angina -nitro as needed  -EKG today due to left arm pain without any ischemic changes  Shortness of breath -fluid overloaded on exam today -lasix 40 mg daily x 3 days and then as needed -BNP today and BMP next week   HLD -LDL 60, at goal < 70 -continue Lipitor '40mg'$  daily -repeat lipid panel 09/2022  HTN -well controlled today -continue current medications        Disposition: Follow up 1 month with [name], MD or APP.  Signed, Elgie Collard, PA-C 04/11/2022, 5:12 PM [facility]

## 2022-04-11 ENCOUNTER — Other Ambulatory Visit: Payer: Self-pay

## 2022-04-11 ENCOUNTER — Ambulatory Visit
Admission: RE | Admit: 2022-04-11 | Discharge: 2022-04-11 | Disposition: A | Discharge status: Home / Self Care | Payer: Medicare Other | Source: Ambulatory Visit | Attending: Radiation Oncology | Admitting: Radiation Oncology

## 2022-04-11 ENCOUNTER — Ambulatory Visit: Payer: Medicare Other | Attending: Physician Assistant | Admitting: Physician Assistant

## 2022-04-11 ENCOUNTER — Encounter: Payer: Self-pay | Admitting: Physician Assistant

## 2022-04-11 VITALS — BP 124/70 | HR 68 | Ht 59.0 in | Wt 212.8 lb

## 2022-04-11 DIAGNOSIS — I10 Essential (primary) hypertension: Secondary | ICD-10-CM | POA: Diagnosis not present

## 2022-04-11 DIAGNOSIS — R0602 Shortness of breath: Secondary | ICD-10-CM

## 2022-04-11 DIAGNOSIS — E785 Hyperlipidemia, unspecified: Secondary | ICD-10-CM | POA: Diagnosis not present

## 2022-04-11 DIAGNOSIS — I2510 Atherosclerotic heart disease of native coronary artery without angina pectoris: Secondary | ICD-10-CM | POA: Diagnosis not present

## 2022-04-11 DIAGNOSIS — I5032 Chronic diastolic (congestive) heart failure: Secondary | ICD-10-CM

## 2022-04-11 DIAGNOSIS — C50411 Malignant neoplasm of upper-outer quadrant of right female breast: Secondary | ICD-10-CM | POA: Diagnosis not present

## 2022-04-11 LAB — RAD ONC ARIA SESSION SUMMARY
Course Elapsed Days: 8
Plan Fractions Treated to Date: 7
Plan Prescribed Dose Per Fraction: 2.66 Gy
Plan Total Fractions Prescribed: 16
Plan Total Prescribed Dose: 42.56 Gy
Reference Point Dosage Given to Date: 18.62 Gy
Reference Point Session Dosage Given: 2.66 Gy
Session Number: 7

## 2022-04-11 MED ORDER — NITROGLYCERIN 0.4 MG SL SUBL: 0.4000 mg | SUBLINGUAL_TABLET | SUBLINGUAL | 3 refills | Status: DC | PRN

## 2022-04-11 NOTE — Patient Instructions (Signed)
Medication Instructions:  1.Take lasix 40 mg daily for the next 3 days, then only take as needed thereafter  2.Start sublingual nitroglycerin 0.4 mg, place one tablet under the tongue as needed, max 3 doses, if no relief call 911 *If you need a refill on your cardiac medications before your next appointment, please call your pharmacy*   Lab Work: BNP today  BMP 1 week If you have labs (blood work) drawn today and your tests are completely normal, you will receive your results only by: [place] (if you have MyChart) OR A paper copy in the mail If you have any lab test that is abnormal or we need to change your treatment, we will call you to review the results.   Follow-Up: At [facility], you and your health needs are our priority.  As part of our continuing mission to provide you with exceptional heart care, we have created designated Provider Care Teams.  These Care Teams include your primary Cardiologist (physician) and Advanced Practice Providers (APPs -  Physician Assistants and Nurse Practitioners) who all work together to provide you with the care you need, when you need it.  Your next appointment:   1 month(s)  The format for your next appointment:   In Person  Provider:   [name], MD  or [name], PA-C       Other Instructions Weigh yourself daily after using the restroom, but before breakfast and keep a log.  Important Information About Sugar

## 2022-04-12 ENCOUNTER — Other Ambulatory Visit: Payer: Self-pay

## 2022-04-12 ENCOUNTER — Ambulatory Visit
Admission: RE | Admit: 2022-04-12 | Discharge: 2022-04-12 | Disposition: A | Discharge status: Home / Self Care | Payer: Medicare Other | Source: Ambulatory Visit | Attending: Radiation Oncology | Admitting: Radiation Oncology

## 2022-04-12 DIAGNOSIS — C50411 Malignant neoplasm of upper-outer quadrant of right female breast: Secondary | ICD-10-CM | POA: Diagnosis not present

## 2022-04-12 LAB — RAD ONC ARIA SESSION SUMMARY
Course Elapsed Days: 9
Plan Fractions Treated to Date: 8
Plan Prescribed Dose Per Fraction: 2.66 Gy
Plan Total Fractions Prescribed: 16
Plan Total Prescribed Dose: 42.56 Gy
Reference Point Dosage Given to Date: 21.28 Gy
Reference Point Session Dosage Given: 2.66 Gy
Session Number: 8

## 2022-04-12 LAB — PRO B NATRIURETIC PEPTIDE: NT-Pro BNP: 54 pg/mL (ref 0–287)

## 2022-04-13 ENCOUNTER — Other Ambulatory Visit: Payer: Self-pay

## 2022-04-13 ENCOUNTER — Ambulatory Visit
Admission: RE | Admit: 2022-04-13 | Discharge: 2022-04-13 | Disposition: A | Discharge status: Home / Self Care | Payer: Medicare Other | Source: Ambulatory Visit | Attending: Radiation Oncology | Admitting: Radiation Oncology

## 2022-04-13 DIAGNOSIS — C50411 Malignant neoplasm of upper-outer quadrant of right female breast: Secondary | ICD-10-CM | POA: Diagnosis not present

## 2022-04-13 LAB — RAD ONC ARIA SESSION SUMMARY
Course Elapsed Days: 10
Plan Fractions Treated to Date: 9
Plan Prescribed Dose Per Fraction: 2.66 Gy
Plan Total Fractions Prescribed: 16
Plan Total Prescribed Dose: 42.56 Gy
Reference Point Dosage Given to Date: 23.94 Gy
Reference Point Session Dosage Given: 2.66 Gy
Session Number: 9

## 2022-04-14 ENCOUNTER — Other Ambulatory Visit: Payer: Self-pay

## 2022-04-14 ENCOUNTER — Ambulatory Visit: Payer: Medicare Other | Admitting: Hematology and Oncology

## 2022-04-14 ENCOUNTER — Ambulatory Visit: Payer: Medicare Other

## 2022-04-14 ENCOUNTER — Ambulatory Visit
Admission: RE | Admit: 2022-04-14 | Discharge: 2022-04-14 | Disposition: A | Discharge status: Home / Self Care | Payer: Medicare Other | Source: Ambulatory Visit | Attending: Radiation Oncology | Admitting: Radiation Oncology

## 2022-04-14 ENCOUNTER — Other Ambulatory Visit: Payer: Medicare Other

## 2022-04-14 DIAGNOSIS — C50411 Malignant neoplasm of upper-outer quadrant of right female breast: Secondary | ICD-10-CM | POA: Diagnosis not present

## 2022-04-14 LAB — RAD ONC ARIA SESSION SUMMARY
Course Elapsed Days: 11
Plan Fractions Treated to Date: 10
Plan Prescribed Dose Per Fraction: 2.66 Gy
Plan Total Fractions Prescribed: 16
Plan Total Prescribed Dose: 42.56 Gy
Reference Point Dosage Given to Date: 26.6 Gy
Reference Point Session Dosage Given: 2.66 Gy
Session Number: 10

## 2022-04-18 ENCOUNTER — Ambulatory Visit: Payer: Medicare Other | Attending: Physician Assistant

## 2022-04-18 ENCOUNTER — Ambulatory Visit
Admission: RE | Admit: 2022-04-18 | Discharge: 2022-04-18 | Disposition: A | Discharge status: Home / Self Care | Payer: Medicare Other | Source: Ambulatory Visit | Attending: Radiation Oncology | Admitting: Radiation Oncology

## 2022-04-18 ENCOUNTER — Other Ambulatory Visit: Payer: Self-pay

## 2022-04-18 ENCOUNTER — Ambulatory Visit: Payer: Medicare Other

## 2022-04-18 DIAGNOSIS — C50411 Malignant neoplasm of upper-outer quadrant of right female breast: Secondary | ICD-10-CM | POA: Diagnosis not present

## 2022-04-18 DIAGNOSIS — R0602 Shortness of breath: Secondary | ICD-10-CM

## 2022-04-18 DIAGNOSIS — I5032 Chronic diastolic (congestive) heart failure: Secondary | ICD-10-CM

## 2022-04-18 DIAGNOSIS — E785 Hyperlipidemia, unspecified: Secondary | ICD-10-CM

## 2022-04-18 DIAGNOSIS — I2510 Atherosclerotic heart disease of native coronary artery without angina pectoris: Secondary | ICD-10-CM

## 2022-04-18 DIAGNOSIS — I10 Essential (primary) hypertension: Secondary | ICD-10-CM

## 2022-04-18 LAB — RAD ONC ARIA SESSION SUMMARY
Course Elapsed Days: 15
Plan Fractions Treated to Date: 11
Plan Prescribed Dose Per Fraction: 2.66 Gy
Plan Total Fractions Prescribed: 16
Plan Total Prescribed Dose: 42.56 Gy
Reference Point Dosage Given to Date: 29.26 Gy
Reference Point Session Dosage Given: 2.66 Gy
Session Number: 11

## 2022-04-19 ENCOUNTER — Ambulatory Visit
Admission: RE | Admit: 2022-04-19 | Discharge: 2022-04-19 | Disposition: A | Discharge status: Home / Self Care | Payer: Medicare Other | Source: Ambulatory Visit | Attending: Radiation Oncology | Admitting: Radiation Oncology

## 2022-04-19 ENCOUNTER — Encounter: Payer: Self-pay | Admitting: Hematology and Oncology

## 2022-04-19 ENCOUNTER — Other Ambulatory Visit: Payer: Self-pay

## 2022-04-19 DIAGNOSIS — C50411 Malignant neoplasm of upper-outer quadrant of right female breast: Secondary | ICD-10-CM | POA: Diagnosis not present

## 2022-04-19 LAB — RAD ONC ARIA SESSION SUMMARY
Course Elapsed Days: 16
Plan Fractions Treated to Date: 12
Plan Prescribed Dose Per Fraction: 2.66 Gy
Plan Total Fractions Prescribed: 16
Plan Total Prescribed Dose: 42.56 Gy
Reference Point Dosage Given to Date: 31.92 Gy
Reference Point Session Dosage Given: 2.66 Gy
Session Number: 12

## 2022-04-19 LAB — BASIC METABOLIC PANEL
BUN/Creatinine Ratio: 16 (ref 12–28)
BUN: 13 mg/dL (ref 8–27)
CO2: 20 mmol/L (ref 20–29)
Calcium: 9.6 mg/dL (ref 8.7–10.3)
Chloride: 107 mmol/L — ABNORMAL HIGH (ref 96–106)
Creatinine, Ser: 0.8 mg/dL (ref 0.57–1.00)
Glucose: 110 mg/dL — ABNORMAL HIGH (ref 70–99)
Potassium: 4.1 mmol/L (ref 3.5–5.2)
Sodium: 142 mmol/L (ref 134–144)
eGFR: 83 mL/min/{1.73_m2} (ref >59)

## 2022-04-20 ENCOUNTER — Ambulatory Visit
Admission: RE | Admit: 2022-04-20 | Discharge: 2022-04-20 | Disposition: A | Discharge status: Home / Self Care | Payer: Medicare Other | Source: Ambulatory Visit | Attending: Radiation Oncology | Admitting: Radiation Oncology

## 2022-04-20 ENCOUNTER — Other Ambulatory Visit: Payer: Self-pay

## 2022-04-20 DIAGNOSIS — C50411 Malignant neoplasm of upper-outer quadrant of right female breast: Secondary | ICD-10-CM | POA: Diagnosis not present

## 2022-04-20 LAB — RAD ONC ARIA SESSION SUMMARY
Course Elapsed Days: 17
Plan Fractions Treated to Date: 13
Plan Prescribed Dose Per Fraction: 2.66 Gy
Plan Total Fractions Prescribed: 16
Plan Total Prescribed Dose: 42.56 Gy
Reference Point Dosage Given to Date: 34.58 Gy
Reference Point Session Dosage Given: 2.66 Gy
Session Number: 13

## 2022-04-21 ENCOUNTER — Other Ambulatory Visit: Payer: Self-pay

## 2022-04-21 ENCOUNTER — Ambulatory Visit
Admission: RE | Admit: 2022-04-21 | Discharge: 2022-04-21 | Disposition: A | Discharge status: Home / Self Care | Payer: Medicare Other | Source: Ambulatory Visit | Attending: Radiation Oncology | Admitting: Radiation Oncology

## 2022-04-21 DIAGNOSIS — Z171 Estrogen receptor negative status [ER-]: Secondary | ICD-10-CM

## 2022-04-21 DIAGNOSIS — C50411 Malignant neoplasm of upper-outer quadrant of right female breast: Secondary | ICD-10-CM | POA: Diagnosis not present

## 2022-04-21 LAB — RAD ONC ARIA SESSION SUMMARY
Course Elapsed Days: 18
Plan Fractions Treated to Date: 14
Plan Prescribed Dose Per Fraction: 2.66 Gy
Plan Total Fractions Prescribed: 16
Plan Total Prescribed Dose: 42.56 Gy
Reference Point Dosage Given to Date: 37.24 Gy
Reference Point Session Dosage Given: 2.66 Gy
Session Number: 14

## 2022-04-21 MED ORDER — RADIAPLEXRX EX GEL
Freq: Once | CUTANEOUS | Status: AC
  Administered 2022-04-21: 1 via TOPICAL

## 2022-04-24 ENCOUNTER — Other Ambulatory Visit (INDEPENDENT_AMBULATORY_CARE_PROVIDER_SITE_OTHER): Payer: Self-pay | Admitting: Family Medicine

## 2022-04-24 DIAGNOSIS — E559 Vitamin D deficiency, unspecified: Secondary | ICD-10-CM

## 2022-04-24 DIAGNOSIS — E1165 Type 2 diabetes mellitus with hyperglycemia: Secondary | ICD-10-CM

## 2022-04-25 ENCOUNTER — Telehealth: Payer: Self-pay | Admitting: *Deleted

## 2022-04-25 ENCOUNTER — Other Ambulatory Visit: Payer: Self-pay

## 2022-04-25 ENCOUNTER — Ambulatory Visit: Payer: 59

## 2022-04-25 ENCOUNTER — Ambulatory Visit
Admission: RE | Admit: 2022-04-25 | Discharge: 2022-04-25 | Disposition: A | Discharge status: Home / Self Care | Payer: 59 | Source: Ambulatory Visit | Attending: Radiation Oncology | Admitting: Radiation Oncology

## 2022-04-25 DIAGNOSIS — C50411 Malignant neoplasm of upper-outer quadrant of right female breast: Secondary | ICD-10-CM | POA: Diagnosis not present

## 2022-04-25 DIAGNOSIS — Z171 Estrogen receptor negative status [ER-]: Secondary | ICD-10-CM | POA: Insufficient documentation

## 2022-04-25 LAB — RAD ONC ARIA SESSION SUMMARY
Course Elapsed Days: 22
Plan Fractions Treated to Date: 15
Plan Prescribed Dose Per Fraction: 2.66 Gy
Plan Total Fractions Prescribed: 16
Plan Total Prescribed Dose: 42.56 Gy
Reference Point Dosage Given to Date: 39.9 Gy
Reference Point Session Dosage Given: 2.66 Gy
Session Number: 15

## 2022-04-25 NOTE — Telephone Encounter (Signed)
Pt walked in around 11:40 this morning, thought she had an appt but did not. Reports some chest discomfort/pain today, 7/8 out of 10.  She wonders if it is a muscle strain as she describes putting up decorations yesterday.  Non radiating. Pt has not taken any nitro.  Advised to take her prn nitro to see if relieves pain. Advised to go to ED or let us know and we can call ambulance if necessary. Pt will let us know if further evaluation is needed after trying the nitro prescribed several weeks ago. She has f/u scheduled for 05/10/22. Pt agreeable to plan.

## 2022-04-26 ENCOUNTER — Other Ambulatory Visit: Payer: Self-pay

## 2022-04-26 ENCOUNTER — Ambulatory Visit
Admission: RE | Admit: 2022-04-26 | Discharge: 2022-04-26 | Disposition: A | Discharge status: Home / Self Care | Payer: 59 | Source: Ambulatory Visit | Attending: Radiation Oncology | Admitting: Radiation Oncology

## 2022-04-26 DIAGNOSIS — C50411 Malignant neoplasm of upper-outer quadrant of right female breast: Secondary | ICD-10-CM | POA: Diagnosis not present

## 2022-04-26 LAB — RAD ONC ARIA SESSION SUMMARY
Course Elapsed Days: 23
Plan Fractions Treated to Date: 16
Plan Prescribed Dose Per Fraction: 2.66 Gy
Plan Total Fractions Prescribed: 16
Plan Total Prescribed Dose: 42.56 Gy
Reference Point Dosage Given to Date: 42.56 Gy
Reference Point Session Dosage Given: 2.66 Gy
Session Number: 16

## 2022-04-27 ENCOUNTER — Ambulatory Visit
Admission: RE | Admit: 2022-04-27 | Discharge: 2022-04-27 | Disposition: A | Discharge status: Home / Self Care | Payer: 59 | Source: Ambulatory Visit | Attending: Radiation Oncology | Admitting: Radiation Oncology

## 2022-04-27 ENCOUNTER — Other Ambulatory Visit: Payer: Self-pay

## 2022-04-27 DIAGNOSIS — C50411 Malignant neoplasm of upper-outer quadrant of right female breast: Secondary | ICD-10-CM | POA: Diagnosis not present

## 2022-04-27 LAB — RAD ONC ARIA SESSION SUMMARY
Course Elapsed Days: 24
Plan Fractions Treated to Date: 1
Plan Prescribed Dose Per Fraction: 2 Gy
Plan Total Fractions Prescribed: 4
Plan Total Prescribed Dose: 8 Gy
Reference Point Dosage Given to Date: 2 Gy
Reference Point Session Dosage Given: 2 Gy
Session Number: 17

## 2022-04-28 ENCOUNTER — Ambulatory Visit
Admission: RE | Admit: 2022-04-28 | Discharge: 2022-04-28 | Disposition: A | Discharge status: Home / Self Care | Payer: 59 | Source: Ambulatory Visit | Attending: Radiation Oncology | Admitting: Radiation Oncology

## 2022-04-28 ENCOUNTER — Other Ambulatory Visit: Payer: Self-pay

## 2022-04-28 DIAGNOSIS — C50411 Malignant neoplasm of upper-outer quadrant of right female breast: Secondary | ICD-10-CM | POA: Diagnosis not present

## 2022-04-28 LAB — RAD ONC ARIA SESSION SUMMARY
Course Elapsed Days: 25
Plan Fractions Treated to Date: 2
Plan Prescribed Dose Per Fraction: 2 Gy
Plan Total Fractions Prescribed: 4
Plan Total Prescribed Dose: 8 Gy
Reference Point Dosage Given to Date: 4 Gy
Reference Point Session Dosage Given: 2 Gy
Session Number: 18

## 2022-05-01 ENCOUNTER — Encounter: Payer: Self-pay | Admitting: Hematology and Oncology

## 2022-05-01 ENCOUNTER — Other Ambulatory Visit: Payer: Self-pay

## 2022-05-01 ENCOUNTER — Ambulatory Visit
Admission: RE | Admit: 2022-05-01 | Discharge: 2022-05-01 | Disposition: A | Discharge status: Home / Self Care | Payer: 59 | Source: Ambulatory Visit | Attending: Radiation Oncology | Admitting: Radiation Oncology

## 2022-05-01 DIAGNOSIS — C50411 Malignant neoplasm of upper-outer quadrant of right female breast: Secondary | ICD-10-CM | POA: Diagnosis not present

## 2022-05-01 LAB — RAD ONC ARIA SESSION SUMMARY
Course Elapsed Days: 28
Plan Fractions Treated to Date: 3
Plan Prescribed Dose Per Fraction: 2 Gy
Plan Total Fractions Prescribed: 4
Plan Total Prescribed Dose: 8 Gy
Reference Point Dosage Given to Date: 6 Gy
Reference Point Session Dosage Given: 2 Gy
Session Number: 19

## 2022-05-02 ENCOUNTER — Ambulatory Visit
Admission: RE | Admit: 2022-05-02 | Discharge: 2022-05-02 | Disposition: A | Discharge status: Home / Self Care | Payer: 59 | Source: Ambulatory Visit | Attending: Radiation Oncology | Admitting: Radiation Oncology

## 2022-05-02 ENCOUNTER — Other Ambulatory Visit: Payer: Self-pay

## 2022-05-02 ENCOUNTER — Encounter: Payer: Self-pay | Admitting: Radiation Oncology

## 2022-05-02 DIAGNOSIS — C50411 Malignant neoplasm of upper-outer quadrant of right female breast: Secondary | ICD-10-CM | POA: Diagnosis not present

## 2022-05-02 LAB — RAD ONC ARIA SESSION SUMMARY
Course Elapsed Days: 29
Plan Fractions Treated to Date: 4
Plan Prescribed Dose Per Fraction: 2 Gy
Plan Total Fractions Prescribed: 4
Plan Total Prescribed Dose: 8 Gy
Reference Point Dosage Given to Date: 8 Gy
Reference Point Session Dosage Given: 2 Gy
Session Number: 20

## 2022-05-02 NOTE — Progress Notes (Signed)
                                                                                                                                                            Patient Name: [name] MRN: [id number] DOB: [birth year] Referring Physician: Dustin Folks Date of Service: 05/02/2022 [facility], [place]                                                        End Of Treatment Note  Diagnoses: C50.411-Malignant neoplasm of upper-outer quadrant of right female breast  Cancer Staging: Stage IB, cT1cN0M0, grade 3, triple negative invasive ductal carcinoma of the right breast   Intent: Curative  Radiation Treatment Dates: 04/03/2022 through 05/02/2022 Site Technique Total Dose (Gy) Dose per Fx (Gy) Completed Fx Beam Energies  Breast, Right: Breast_R 3D 42.56/42.56 2.66 16/16 10XFFF  Breast, Right: Breast_R_Bst specialPort 8/8 2 4/4 12E   Narrative: The patient tolerated radiation therapy relatively well. She developed fatigue and anticipated skin changes in the treatment field.   Plan: The patient will receive a call in about one month from the radiation oncology department. She will continue follow up with Dr. Chryl Heck as well.   ________________________________________________    Carola Rhine, [facility] [address]

## 2022-05-03 ENCOUNTER — Encounter: Payer: Self-pay | Admitting: Hematology and Oncology

## 2022-05-09 NOTE — Progress Notes (Signed)
Office Visit    Patient Name: [name] Date of Encounter: 05/10/2022  PCP:  [facility]  Cardiologist:  [name], MD  Advanced Practice Provider:  No care team member to display Electrophysiologist:  None   HPI    [name] is a 62 y.o. female with past medical history significant for breast cancer (newly diagnosed), bone cancer, HFpEF, OSA, remote history of PE no longer on [place], TAA, HTN, atypical chest pain and HLD presents today for hospital follow-up appointment.  She was originally seen by Dr. Martinique in 2008 and underwent normal LHC with normal LV function.  She was treated with Coumadin in 04/2006 for questionable PE.  Cardiac CTA was completed 01/2010 which showed no PE.  She was seen in the ED 2012 for complaints of chest pain.  She underwent nuclear stress test with Myoview that was found to be low risk, chest pain was likely ruled musculoskeletal etiology.  Patient has subsequent repeat Myoview for chest pain in 10/2012 which was normal.  She was hospitalized 05/2014 with chest pain and left-sided numbness and MRI was completed to rule out stroke which was negative.  She was seen in the ER for chest pain 09/03/2020.  Ruled out with negative tropes.  D-dimer was elevated.  CTA negative for PE.  She was seen in the ED for complaint of chest pain in 02/2021.  Patient reported throbbing dull pain in the center of her chest with associated shortness of breath.  CT was completed that was negative for PE but did reveal possible worsening pericardial effusion.  2D echocardiogram was ordered which showed EF 50 to 55%, no RWMA, mild LVH, grade 2 DD, and normal valve function.  Her troponins were negative x 2 and EKG had nonspecific ST changes.  Patient had relief with Nitropaste and was referred to GI for possible upper endoscopy.  She was seen in the ED 11/2020 with complaint of chest pain or shortness of breath thought to be  possible CHF exacerbation.  BNP was normal and troponins were negative.  The patient was last seen 12/12/2021 and had just been in the ED for possible CHF exacerbation.  She reported not feeling well and denied any current chest discomfort on follow-up.  She has been euvolemic on exam with been compliant with all of her current medications.  Blood pressure 108/70 and reported that they have been on the low side for a few days.  11/30-12/5 She was admitted and started on spiro, jardiance, and losartan. Echo reviewed with normal diastolic and systolic function. She has sciatica which keeps her from doing any significant walking. She tells me that after her last chemo treatment was when the swelling in her legs started. Seen in follow-up 12/19 by myself and  she does has some swelling and left arm pain. She tells me she has gained about 7 lbs since she was in the hospital. No chest pain.    Today, she shares her SOB and lower ext swelling is much better. She has not had any chest pain or palpitations. Her cancer treatments are over and now she has noticed less swelling. She did call the cancer center Friday since she has some radiation burn under her right breast and needed some cream. She ended up using an OTC burn cream which has helped. She does not feel like her concerns are being addressed by the cancer center. Otherwise, doing well from a  CV standpoint.  Reports no shortness of breath nor dyspnea on exertion. Reports no chest pain, pressure, or tightness. No edema, orthopnea, PND. Reports no palpitations.    Past Medical History    Past Medical History:  Diagnosis Date   Allergy    Anginal pain ([place])    a. NL cath in 2008;  b. Myoview 03/2011: dec uptake along mid anterior wall on stress imaging -> ? attenuation vs. ischemia, EF 65%;  c. 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 2008 -> coumadin x 6 mos.   Rheumatoid arthritis ([place])    Sleep apnea    on CPAP 02/2018   SOB (shortness of breath)    Thyroid disease    TIA (transient ischemic attack)    Urinary urgency    Vitamin D deficiency    Past Surgical History:  Procedure Laterality Date   ABDOMINAL HYSTERECTOMY  2005   APPENDECTOMY     BREAST LUMPECTOMY WITH RADIOACTIVE SEED AND SENTINEL LYMPH NODE BIOPSY Right 11/16/2021   Procedure: RIGHT BREAST RADIOACTIVE SEED LOCALIZED LUMPECTOMY WITH RIGHT AXILLARY SENTINEL LYMPH NODE BIOPSY;  Surgeon: Donnie Mesa, MD;  Location: [facility];  Service: General;  Laterality: Right;  LMA & PEC BLOCK   CARDIAC CATHETERIZATION     Normal   CARDIAC CATHETERIZATION N/A 09/30/2014   Procedure: Left Heart Cath and Coronary Angiography;  Surgeon: Sherren Mocha, MD;  Location: [facility];  Service: Cardiovascular;  Laterality: N/A;   LAPAROSCOPIC APPENDECTOMY N/A 06/03/2012   Procedure: APPENDECTOMY LAPAROSCOPIC;  Surgeon: Stark Klein, MD;  Location: [place];  Service: General;  Laterality: N/A;   Left knee surgery  2008   LEG SURGERY     TONSILLECTOMY     TOTAL HIP ARTHROPLASTY Left 06/11/2018   Procedure: LEFT TOTAL HIP ARTHROPLASTY ANTERIOR APPROACH;  Surgeon: Meredith Pel, MD;  Location: [place];  Service: Orthopedics;  Laterality: Left;   TOTAL KNEE ARTHROPLASTY  Left 08/22/2016   Procedure: TOTAL KNEE ARTHROPLASTY;  Surgeon: Meredith Pel, MD;  Location: [place] [place];  Service: Orthopedics;  Laterality: Left;   TUBAL LIGATION  1989    Allergies  Allergies  Allergen Reactions   Hydrocodone-Acetaminophen Itching and Other (See Comments)    confusion   Penicillins Swelling and Other (See Comments)    Has patient had a PCN reaction causing immediate rash, facial/tongue/throat swelling, SOB or lightheadedness with hypotension: YES Has patient had a PCN reaction causing  severe rash involving mucus membranes or skin necrosis: NO Has patient had a PCN reaction that required hospitalization NO Has patient had a PCN reaction occurring within the last 10 years: NO If all of the above answers are "NO", then may proceed with Cephalosporin use.   Shellfish Allergy Hives   Latex Rash   Sulfa Antibiotics Diarrhea, Itching and Rash   EKGs/Labs/Other Studies Reviewed:   The following studies were reviewed today:  03/24/2022 echocardiogram  IMPRESSIONS     1. Left ventricular ejection fraction, by estimation, is 60 to 65%. The  left ventricle has normal function. The left ventricle has no regional  wall motion abnormalities. Left ventricular diastolic parameters were  normal.   2. Right ventricular systolic function is normal. The right ventricular  size is normal.   3. The mitral valve is normal in structure. Trivial mitral valve  regurgitation. No evidence of mitral stenosis.   4. The aortic valve is tricuspid. Aortic valve regurgitation is trivial.  No aortic stenosis is present.   5. The inferior vena cava is dilated in size with <50% respiratory  variability, suggesting right atrial pressure of 15 mmHg.   FINDINGS   Left Ventricle: Left ventricular ejection fraction, by estimation, is 60  to 65%. The left ventricle has normal function. The left ventricle has no  regional wall motion abnormalities. The left ventricular internal cavity  size was  normal in size. There is   no left ventricular hypertrophy. Left ventricular diastolic parameters  were normal.   Right Ventricle: The right ventricular size is normal. Right ventricular  systolic function is normal.   Left Atrium: Left atrial size was normal in size.   Right Atrium: Right atrial size was normal in size.   Pericardium: There is no evidence of pericardial effusion.   Mitral Valve: The mitral valve is normal in structure. Trivial mitral  valve regurgitation. No evidence of mitral valve stenosis.   Tricuspid Valve: The tricuspid valve is normal in structure. Tricuspid  valve regurgitation is trivial. No evidence of tricuspid stenosis.   Aortic Valve: The aortic valve is tricuspid. Aortic valve regurgitation is  trivial. No aortic stenosis is present.   Pulmonic Valve: The pulmonic valve was normal in structure. Pulmonic valve  regurgitation is mild. No evidence of pulmonic stenosis.   Aorta: The aortic root is normal in size and structure.   Venous: The inferior vena cava is dilated in size with less than 50%  respiratory variability, suggesting right atrial pressure of 15 mmHg.   IAS/Shunts: No atrial level shunt detected by color flow Doppler.   EKG:  EKG is  ordered today.  The ekg ordered today demonstrates NSR with no ST elevation  Recent Labs: 03/23/2022: B Natriuretic Peptide 22.9 03/24/2022: ALT 23; Hemoglobin 9.1; Platelets 184; TSH 0.467 03/27/2022: Magnesium 2.2 04/11/2022: NT-Pro BNP 54 04/18/2022: BUN 13; Creatinine, Ser 0.80; Potassium 4.1; Sodium 142  Recent Lipid Panel    Component Value Date/Time   CHOL 127 09/28/2021 0951   TRIG 99 09/28/2021 0951   HDL 48 09/28/2021 0951   CHOLHDL 4.2 04/23/2011 0830   CHOLHDL 4.3 04/23/2011 0830   VLDL 29 04/23/2011 0830   VLDL 29 04/23/2011 0830   LDLCALC 60 09/28/2021 0951    Home Medications   Current Meds  Medication Sig   ACCU-CHEK GUIDE test strip    acetaminophen (TYLENOL) 500 MG tablet  Take 1,000 mg by mouth 2 (two) times daily as needed for moderate pain.   albuterol (PROVENTIL  HFA;VENTOLIN HFA) 108 (90 Base) MCG/ACT inhaler Inhale 2 puffs into the lungs every 6 (six) hours as needed for wheezing or shortness of breath.   aspirin 81 MG chewable tablet Chew 81 mg by mouth daily.   atorvastatin (LIPITOR) 40 MG tablet Take 40 mg by mouth at bedtime.    busPIRone (BUSPAR) 30 MG tablet Take 30 mg by mouth 2 (two) times daily.   carvedilol (COREG) 6.25 MG tablet Take 1 tablet (6.25 mg total) by mouth 2 (two) times daily.   diclofenac Sodium (VOLTAREN) 1 % GEL Apply 2 g topically 4 (four) times daily as needed (joint/muscle pain).   empagliflozin (JARDIANCE) 10 MG TABS tablet Take 1 tablet (10 mg total) by mouth daily.   EPINEPHrine 0.3 mg/0.3 mL IJ SOAJ injection Inject 0.3 mg into the muscle as needed for anaphylaxis. Follow package instructions as needed for severe allergy or anaphylactic reaction.   hydrOXYzine (VISTARIL) 25 MG capsule Take 25 mg by mouth 3 (three) times daily as needed for itching.   lidocaine (LIDODERM) 5 % Place 1 patch onto the skin daily as needed for pain.   lidocaine-prilocaine (EMLA) cream Apply to affected area once (Patient taking differently: Apply 1 Application topically once. Apply to affected area once)   loperamide (IMODIUM) 2 MG capsule Take 1 capsule (2 mg total) by mouth 4 (four) times daily as needed for diarrhea or loose stools.   metFORMIN (GLUCOPHAGE) 500 MG tablet Take 1 tablet (500 mg total) by mouth daily with breakfast.   nitroGLYCERIN (NITROSTAT) 0.4 MG SL tablet Place 1 tablet (0.4 mg total) under the tongue every 5 (five) minutes as needed for chest pain.   nystatin (MYCOSTATIN/NYSTOP) powder Apply 1 application topically 3 (three) times daily as needed for rash.   ondansetron (ZOFRAN-ODT) 4 MG disintegrating tablet Take 1 tablet (4 mg total) by mouth every 8 (eight) hours as needed for nausea or vomiting.   oxyCODONE (OXY IR/ROXICODONE)  5 MG immediate release tablet Take 1 tablet (5 mg total) by mouth every 6 (six) hours as needed for severe pain.   pantoprazole (PROTONIX) 40 MG tablet Take 1 tablet (40 mg total) by mouth 2 (two) times daily. (Patient taking differently: Take 40 mg by mouth daily.)   prochlorperazine (COMPAZINE) 10 MG tablet Take 1 tablet (10 mg total) by mouth every 6 (six) hours as needed for nausea or vomiting.   sertraline (ZOLOFT) 25 MG tablet Take 1 tablet (25 mg total) by mouth daily.   Vitamin D, Ergocalciferol, (DRISDOL) 1.25 MG (50000 UNIT) CAPS capsule Take 1 capsule (50,000 Units total) by mouth every 7 (seven) days.     Review of Systems      All other systems reviewed and are otherwise negative except as noted above.  Physical Exam    VS:  BP 126/70   Pulse 79   Ht '4\' 11"'$  (1.499 m)   Wt 209 lb (94.8 kg)   SpO2 97%   BMI 42.21 kg/m  , BMI Body mass index is 42.21 kg/m.  Wt Readings from Last 3 Encounters:  05/10/22 209 lb (94.8 kg)  04/11/22 212 lb 12.8 oz (96.5 kg)  03/23/22 210 lb (95.3 kg)     GEN: Well nourished, well developed, in no acute distress. HEENT: normal. Neck: Supple, no JVD, carotid bruits, or masses. Cardiac: RRR, no murmurs, rubs, or gallops. No clubbing, cyanosis, 1+ pitting edema bilaterally.  Radials/PT 2+ and equal bilaterally.  Respiratory:  Respirations regular and unlabored, clear to auscultation bilaterally.  GI: Soft, nontender, nondistended. MS: No deformity or atrophy. Skin: Warm and dry, no rash. Neuro:  Strength and sensation are intact. Psych: Normal affect.  Assessment & Plan    HFpEF -euvolemic on exam today -low sodium diet -lower ext compression -daily weights and if increases by 2 lbs overnight or 5 lbs in a week we need a phone call -she had her last treatment (radiation for breast cancer) earlier this months  Stable angina -nitro as needed  -no chest pain today  Shortness of breath -euvolemic on exam today -Much improved  today -she has PRN lasix   HLD -LDL 60, at goal < 70 -continue Lipitor '40mg'$  daily -repeat lipid panel 09/2022  HTN -well controlled today -continue current medications        Disposition: Follow up 6 month with [name], MD or APP.  Signed, Elgie Collard, PA-C 05/10/2022, 12:20 PM [facility]

## 2022-05-10 ENCOUNTER — Encounter: Payer: Self-pay | Admitting: Hematology and Oncology

## 2022-05-10 ENCOUNTER — Ambulatory Visit: Payer: 59 | Attending: Physician Assistant | Admitting: Physician Assistant

## 2022-05-10 ENCOUNTER — Encounter: Payer: Self-pay | Admitting: Physician Assistant

## 2022-05-10 VITALS — BP 126/70 | HR 79 | Ht 59.0 in | Wt 209.0 lb

## 2022-05-10 DIAGNOSIS — I5032 Chronic diastolic (congestive) heart failure: Secondary | ICD-10-CM

## 2022-05-10 DIAGNOSIS — I10 Essential (primary) hypertension: Secondary | ICD-10-CM | POA: Diagnosis not present

## 2022-05-10 DIAGNOSIS — I2510 Atherosclerotic heart disease of native coronary artery without angina pectoris: Secondary | ICD-10-CM

## 2022-05-10 DIAGNOSIS — R0602 Shortness of breath: Secondary | ICD-10-CM

## 2022-05-10 DIAGNOSIS — I2089 Other forms of angina pectoris: Secondary | ICD-10-CM

## 2022-05-10 DIAGNOSIS — E785 Hyperlipidemia, unspecified: Secondary | ICD-10-CM

## 2022-05-10 NOTE — Patient Instructions (Signed)
Medication Instructions:  Your physician recommends that you continue on your current medications as directed. Please refer to the Current Medication list given to you today.  *If you need a refill on your cardiac medications before your next appointment, please call your pharmacy*   Lab Work: Fasting lipid panel prior to 6 month follow up If you have labs (blood work) drawn today and your tests are completely normal, you will receive your results only by: [place] (if you have MyChart) OR A paper copy in the mail If you have any lab test that is abnormal or we need to change your treatment, we will call you to review the results.   Follow-Up: At [facility], you and your health needs are our priority.  As part of our continuing mission to provide you with exceptional heart care, we have created designated Provider Care Teams.  These Care Teams include your primary Cardiologist (physician) and Advanced Practice Providers (APPs -  Physician Assistants and Nurse Practitioners) who all work together to provide you with the care you need, when you need it.  Your next appointment:   6 month(s)  Provider:   [name], MD    Other Instructions Call the [facility] to inquire about the p.r.e.p program Consider joining [facility] through [facility]

## 2022-05-15 ENCOUNTER — Telehealth: Payer: Self-pay | Admitting: *Deleted

## 2022-05-15 ENCOUNTER — Encounter: Payer: Self-pay | Admitting: *Deleted

## 2022-05-15 ENCOUNTER — Other Ambulatory Visit: Payer: Self-pay

## 2022-05-15 ENCOUNTER — Ambulatory Visit
Admission: RE | Admit: 2022-05-15 | Discharge: 2022-05-15 | Disposition: A | Discharge status: Home / Self Care | Payer: 59 | Source: Ambulatory Visit | Attending: Radiation Oncology | Admitting: Radiation Oncology

## 2022-05-15 ENCOUNTER — Encounter: Payer: Self-pay | Admitting: Radiation Oncology

## 2022-05-15 VITALS — BP 125/56 | HR 80 | Temp 97.9°F | Resp 20 | Ht <= 58 in | Wt 213.2 lb

## 2022-05-15 DIAGNOSIS — C50411 Malignant neoplasm of upper-outer quadrant of right female breast: Secondary | ICD-10-CM

## 2022-05-15 DIAGNOSIS — Z923 Personal history of irradiation: Secondary | ICD-10-CM | POA: Insufficient documentation

## 2022-05-15 DIAGNOSIS — Z171 Estrogen receptor negative status [ER-]: Secondary | ICD-10-CM | POA: Insufficient documentation

## 2022-05-15 MED ORDER — DOXYCYCLINE HYCLATE 100 MG PO TABS: 100.0000 mg | ORAL_TABLET | Freq: Two times a day (BID) | ORAL | 0 refills | Status: DC

## 2022-05-15 NOTE — Progress Notes (Signed)
Nursing interview for a skin-check of the RT breast/axilla. I verified patient's identity.   Patient reports skin of RT breast, nipple and axilla is warm and tender 7/10, w/ an exterior hardened area on the RT side of the RT breast. The inferior area of the RT breast is raw and moist but improving. Patient states "Radiaplex is worsening the issue."  Meaningful use complete.  BP (!) 125/56 (BP Location: Left Arm, Patient Position: Sitting, Cuff Size: Large)   Pulse 80   Temp 97.9 F (36.6 C)   Resp 20   Ht '4\' 10"'$  (1.473 m)   Wt 213 lb 3.2 oz (96.7 kg)   SpO2 99%   BMI 44.56 kg/m   This concludes the interview.   Leandra Kern, LPN

## 2022-05-15 NOTE — Telephone Encounter (Signed)
Pt called reporting Right breast " not feeling right ", and under arm burn pain.  Stated completed radiation on 1/9.  C/O right breast and under arm painful and hard.   Used radioplex and OTC lidocaine with little relief.  Denied  fever, just painful. Pt would like to be evaluated for any possible infection. Pt's  phone   [phone].

## 2022-05-15 NOTE — Progress Notes (Signed)
Patient Name: [name] MRN: [id number] DOB: [birth year] Referring Physician: Dustin Folks Date of Service: 05/02/2022  [facility] [place]                                                        Post Treatment Visit  Diagnoses: C50.411-Malignant neoplasm of upper-outer quadrant of right female breast  Cancer Staging: Stage IB, cT1cN0M0, grade 3, triple negative invasive ductal carcinoma of the right breast   Intent: Curative  Radiation Treatment Dates: 04/03/2022 through 05/02/2022 Site Technique Total Dose (Gy) Dose per Fx (Gy) Completed Fx Beam Energies  Breast, Right: Breast_R 3D 42.56/42.56 2.66 16/16 10XFFF  Breast, Right: Breast_R_Bst specialPort 8/8 2 4/4 12E   Narrative: The patient tolerated radiation therapy relatively well. She developed fatigue and anticipated skin changes in the treatment field.  Apparently the patient attempted to contact us over a week ago with some symptoms of irritation in her axilla and right breast.  She was able to get in touch today with Dr. Rob Hickman team.  She is seen as a work in today.  In the last week she has developed increasing redness, fullness and pain in her right axilla. She had peeling in the inframammary fold with dry desquamation she shows me in pictures and has been using otc lotion including aquaphor and a cream that may contain aloe.   On exam her right breast is edematous with what feels like lymphedema with engorged pores, over her pendulous breast. Her areola is tight and firm similarly. The inframammary fold has hyperpigmentation without peeling or open skin. Similar changes are seen with dry desquamation though as well in the right axilla and mild erythema that is tender and warm to touch. The borders of the erythema were drawn on the patient's skin with verbal  consent.   Plan: I will send in a prescription for doxycycline and she is encouraged to return to wearing her compression bra. If her symptoms haven't improved before Friday she will notify us and she will need to get back in as well to see PT and routine appts with her surgical team. The patient will receive a call in February 2024 from the radiation oncology department. She will continue follow up with Dr. Chryl Heck as well.   ________________________________________________    Carola Rhine, [facility]

## 2022-05-16 ENCOUNTER — Other Ambulatory Visit: Payer: Self-pay | Admitting: *Deleted

## 2022-05-16 ENCOUNTER — Telehealth: Payer: Self-pay | Admitting: Hematology and Oncology

## 2022-05-16 DIAGNOSIS — C50411 Malignant neoplasm of upper-outer quadrant of right female breast: Secondary | ICD-10-CM

## 2022-05-16 NOTE — Telephone Encounter (Signed)
Contacted patient to scheduled appointments. Patient is aware of appointments that are scheduled.   

## 2022-05-17 ENCOUNTER — Ambulatory Visit: Payer: 59 | Attending: Hematology and Oncology

## 2022-05-17 ENCOUNTER — Other Ambulatory Visit: Payer: Self-pay

## 2022-05-17 DIAGNOSIS — I890 Lymphedema, not elsewhere classified: Secondary | ICD-10-CM

## 2022-05-17 DIAGNOSIS — M25611 Stiffness of right shoulder, not elsewhere classified: Secondary | ICD-10-CM

## 2022-05-17 DIAGNOSIS — C50411 Malignant neoplasm of upper-outer quadrant of right female breast: Secondary | ICD-10-CM | POA: Diagnosis present

## 2022-05-17 DIAGNOSIS — Z483 Aftercare following surgery for neoplasm: Secondary | ICD-10-CM | POA: Diagnosis present

## 2022-05-17 DIAGNOSIS — Z171 Estrogen receptor negative status [ER-]: Secondary | ICD-10-CM | POA: Diagnosis present

## 2022-05-17 DIAGNOSIS — R293 Abnormal posture: Secondary | ICD-10-CM | POA: Insufficient documentation

## 2022-05-17 NOTE — Therapy (Incomplete)
OUTPATIENT PHYSICAL THERAPY BREAST CANCER EVALUATION   Patient Name: [name] MRN: [id number] DOB:Jun 10, 1960, 62 y.o., female Today's Date: 05/17/2022     Past Medical History:  Diagnosis Date   Allergy    Anginal pain ([place])    a. NL cath in 2008;  b. Myoview 03/2011: dec uptake along mid anterior wall on stress imaging -> ? attenuation vs. ischemia, EF 65%;  c. 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 benign thymoma   Neuromuscular disorder ([place])    Obesity    Palpitations    Pneumonia 05/2016   double   Pulmonary edema    Pulmonary embolism ([place])    a. 2008 -> coumadin x 6 mos.   Rheumatoid arthritis ([place])    Sleep apnea    on CPAP 02/2018   SOB (shortness of breath)    Thyroid disease    TIA (transient ischemic attack)    Urinary urgency    Vitamin D deficiency    Past Surgical History:  Procedure Laterality Date   ABDOMINAL HYSTERECTOMY  2005   APPENDECTOMY     BREAST LUMPECTOMY WITH RADIOACTIVE SEED AND SENTINEL LYMPH NODE BIOPSY Right 11/16/2021   Procedure: RIGHT BREAST RADIOACTIVE SEED LOCALIZED LUMPECTOMY WITH RIGHT AXILLARY SENTINEL LYMPH NODE BIOPSY;  Surgeon: Donnie Mesa, MD;  Location: [place];  Service: General;  Laterality: Right;  LMA & PEC BLOCK   CARDIAC CATHETERIZATION     Normal   CARDIAC CATHETERIZATION N/A 09/30/2014   Procedure: Left Heart Cath and Coronary Angiography;  Surgeon:  Sherren Mocha, MD;  Location: [place] [facility];  Service: Cardiovascular;  Laterality: N/A;   LAPAROSCOPIC APPENDECTOMY N/A 06/03/2012   Procedure: APPENDECTOMY LAPAROSCOPIC;  Surgeon: Stark Klein, MD;  Location: [facility];  Service: General;  Laterality: N/A;   Left knee surgery  2008   LEG SURGERY     TONSILLECTOMY     TOTAL HIP ARTHROPLASTY Left 06/11/2018   Procedure: LEFT TOTAL HIP ARTHROPLASTY ANTERIOR APPROACH;  Surgeon: Meredith Pel, MD;  Location: [place];  Service: Orthopedics;  Laterality: Left;   TOTAL KNEE ARTHROPLASTY Left 08/22/2016   Procedure: TOTAL KNEE ARTHROPLASTY;  Surgeon: Meredith Pel, MD;  Location: [place];  Service: Orthopedics;  Laterality: Left;   TUBAL LIGATION  1989   Patient Active Problem List   Diagnosis Date Noted   Heart failure ([place]) 03/25/2022   Chest pain 03/24/2022   Multiple thyroid nodules 11/28/2021   Genetic testing 11/22/2021   Family history of breast cancer 11/03/2021   Family history of colon cancer 11/03/2021   Malignant neoplasm of upper-outer quadrant of female breast ([place]) 11/02/2021   Back pain 06/01/2021  Right thyroid nodule 05/31/2021   At risk for impaired metabolic function 50/12/3816   Unilateral primary osteoarthritis, left hip    Hip arthritis 06/11/2018   Class 3 severe obesity with serious comorbidity and body mass index (BMI) of 40.0 to 44.9 in adult [facility]) 05/29/2018   Other fatigue 11/20/2017   Shortness of breath on exertion 11/20/2017   Type 2 diabetes mellitus with hyperlipidemia ([employer]) 11/20/2017   Vitamin D deficiency 11/20/2017   Depression 11/20/2017   Other hyperlipidemia 11/20/2017   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/04/2016   Presence of left artificial knee joint 09/20/2016   Arthritis of knee 08/22/2016   Cervical radiculopathy 07/26/2016   Cervical disc disorder with radiculopathy 07/26/2016   Right arm pain 06/29/2016   Cervicalgia 06/29/2016   Primary osteoarthritis of left knee 06/29/2016    Hypersomnia with sleep apnea 11/18/2015   Lethargy 11/18/2015   Exertional chest pain 09/30/2014   Abnormal cardiac function test 09/29/2014   Chest pain with moderate risk for cardiac etiology 09/28/2014   HTN (hypertension) 09/28/2014   AKI (acute kidney injury) ([place])    Paresthesia 06/18/2014   Numbness and tingling of left arm and leg    Unstable angina ([place]) 05/24/2014   OSA on CPAP 05/24/2014   CAD (coronary artery disease) 11/25/2012   S/P laparoscopic appendectomy 06/04/2012   Acute on chronic diastolic CHF (congestive heart failure) ([place]) 01/09/2012   Mediastinal mass 01/09/2012   SOB (shortness of breath) 06/20/2011   Mediastinal abnormality 06/20/2011   Dyslipidemia 12/23/2009   GLAUCOMA 12/23/2009   ARTHRITIS 12/23/2009   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 FNP   REFERRING PROVIDER: Dr. Georgette Dover   REFERRING DIAG: Rt breast cancer  THERAPY DIAG:  No diagnosis found.  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 Other hx includes  DM, CHF, hx of stroke, anxiety.  Had an axillary seroma drained x 4.  Completed chemo and radiation ended 05/02/22.    PATIENT GOALS:  Reassess how my recovery is going related to arm function, pain, and swelling.  PAIN:  Are you having pain? No   PRECAUTIONS: Recent Surgery, right UE Lymphedema risk  ACTIVITY LEVEL / LEISURE: It is just the chemo part that is hard.     OBJECTIVE:   PATIENT SURVEYS:    OBSERVATIONS:  Well healed axillary and breast incisions   POSTURE:  Forward head and rounded shoulders posture   UPPER EXTREMITY AROM/PROM:   A/PROM RIGHT   eval   01/11/22  Shoulder extension 48 60  Shoulder flexion 132pn 135pn  Shoulder abduction 135pn 135  Shoulder internal rotation     Shoulder external rotation 78pn in elbow  80                          (Blank rows = not tested)   A/PROM LEFT   eval  Shoulder extension 55  Shoulder flexion 153  Shoulder abduction 145  Shoulder internal rotation    Shoulder external rotation 85                          (Blank rows = not tested)     CERVICAL AROM: All within normal limits:    LYMPHEDEMA ASSESSMENTS:    LANDMARK RIGHT   eval 01/11/22  10 cm proximal to olecranon process 36 37  Olecranon process 29 29.5  10 cm proximal to ulnar styloid process 23.5 23.9  Just proximal to ulnar styloid process 17.5 17.4  Across hand at thumb web space 20.0 19.4  At base of 2nd digit 7.3 7.0  (Blank rows = not tested)   LANDMARK LEFT   eval 01/11/22  10 cm proximal to olecranon process 35.2 36.5  Olecranon process 30.5 30.5  10 cm proximal to ulnar styloid process 23.5 23.5  Just proximal to ulnar styloid process 17 17  Across hand at thumb web space 20.3 20.5  At base of 2nd digit 6.9 6.9  (Blank rows = not tested)   PATIENT EDUCATION:  Education details: updated HEP Person educated: Patient Education method: Consulting civil engineer, Media planner, Verbal cues, and Handouts Education comprehension: verbalized understanding and returned demonstration  HOME EXERCISE PROGRAM:  Reviewed previously given post op HEP. Access Code:  [email or web address] URL: [email or web address] Date: 01/11/2022 Prepared by: Shan Levans  Exercises - Supine Shoulder Flexion with Dowel  - 1 x daily - 7 x weekly - 1-3 sets - 10 reps - 5 seconds hold - Supine Chest Stretch with Elbows Bent  - 1 x daily - 7 x weekly - 1 sets - 3 reps - 30-60seconds hold - Standing Shoulder Abduction Slides at Wall  - 1 x daily - 7 x weekly - 1-3 sets - 5 reps - 5 seconds hold - Seated Scapular Retraction  - 1 x daily - 7 x weekly - 1-3 sets - 10 reps - 2-3 seconds hold  ASSESSMENT:  CLINICAL IMPRESSION: Pt is doing well post operatively.  Developed a seroma which she had aspirated 4 times but seems to be okay now.  Has some cording in the axilla but has returned to full ROM so pt will attempt self stretches and let us know if she needs any help with this.    Pt will benefit from skilled therapeutic intervention to  improve on the following deficits: Decreased knowledge of precautions, impaired UE functional use, pain, decreased ROM, postural dysfunction.   PT treatment/interventions: ADL/Self care home management, Patient/Family education     GOALS: Goals reviewed with patient? Yes  LONG TERM GOALS:  (STG=LTG)  GOALS Name Target Date  Goal status  1 Pt will demonstrate she has regained full shoulder ROM and function post operatively compared to baselines.  Baseline: 01/11/22 MET                    PLAN: PT FREQUENCY/DURATION: follow up as needed  PLAN FOR NEXT SESSION: SOZO only    [facility]  [address], [address]  [place] [address]  [phone]  After Breast Cancer Class It is recommended you attend the ABC class to be educated on lymphedema risk reduction. This class is free of charge and lasts for 1 hour. It is a 1-time class. You will need to download the Webex app either on your phone or computer. We will send you a link the night before or the morning of the class. You should be able to click on  that link to join the class. This is not a confidential class. You don't have to turn your camera on, but other participants may be able to see your email address.  Scar massage You can begin gentle scar massage to you incision sites. Gently place one hand on the incision and move the skin (without sliding on the skin) in various directions. Do this for a few minutes and then you can gently massage either coconut oil or vitamin E cream into the scars.  Compression garment You should continue wearing your compression bra until you feel like you no longer have swelling.  Home exercise Program Continue doing the exercises you were given until you feel like you can do them without feeling any tightness at the end.   Walking Program Studies show that 30 minutes of walking per day (fast enough to elevate your heart rate) can significantly reduce the risk of a cancer recurrence. If you can't walk due to other medical reasons, we encourage you to find another activity you could do (like a stationary bike or water exercise).  Posture After breast cancer surgery, people frequently sit with rounded shoulders posture because it puts their incisions on slack and feels better. If you sit like this and scar tissue forms in that position, you can become very tight and have pain sitting or standing with good posture. Try to be aware of your posture and sit and stand up tall to heal properly.  Follow up PT: It is recommended you return every 3 months for the first 3 years following surgery to be assessed on the SOZO machine for an L-Dex score. This helps prevent clinically significant lymphedema in 95% of patients. These follow up screens are 10 minute appointments that you are not billed for.  Stark Bray, PT 05/17/2022, 2:45 PM
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
## 2022-05-21 ENCOUNTER — Other Ambulatory Visit (INDEPENDENT_AMBULATORY_CARE_PROVIDER_SITE_OTHER): Payer: Self-pay | Admitting: Family Medicine

## 2022-05-21 DIAGNOSIS — E559 Vitamin D deficiency, unspecified: Secondary | ICD-10-CM

## 2022-05-23 ENCOUNTER — Ambulatory Visit: Payer: 59

## 2022-05-23 DIAGNOSIS — Z171 Estrogen receptor negative status [ER-]: Secondary | ICD-10-CM

## 2022-05-23 DIAGNOSIS — M25611 Stiffness of right shoulder, not elsewhere classified: Secondary | ICD-10-CM

## 2022-05-23 DIAGNOSIS — C50411 Malignant neoplasm of upper-outer quadrant of right female breast: Secondary | ICD-10-CM | POA: Diagnosis not present

## 2022-05-23 DIAGNOSIS — I890 Lymphedema, not elsewhere classified: Secondary | ICD-10-CM

## 2022-05-23 DIAGNOSIS — Z483 Aftercare following surgery for neoplasm: Secondary | ICD-10-CM

## 2022-05-23 DIAGNOSIS — R293 Abnormal posture: Secondary | ICD-10-CM
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
## 2022-05-25 ENCOUNTER — Ambulatory Visit: Payer: 59 | Attending: Hematology and Oncology

## 2022-05-25 DIAGNOSIS — Z171 Estrogen receptor negative status [ER-]: Secondary | ICD-10-CM | POA: Insufficient documentation

## 2022-05-25 DIAGNOSIS — I890 Lymphedema, not elsewhere classified: Secondary | ICD-10-CM | POA: Diagnosis present

## 2022-05-25 DIAGNOSIS — C50411 Malignant neoplasm of upper-outer quadrant of right female breast: Secondary | ICD-10-CM | POA: Diagnosis present

## 2022-05-25 DIAGNOSIS — R293 Abnormal posture: Secondary | ICD-10-CM | POA: Insufficient documentation

## 2022-05-25 DIAGNOSIS — Z483 Aftercare following surgery for neoplasm: Secondary | ICD-10-CM | POA: Insufficient documentation

## 2022-05-25 DIAGNOSIS — M25611 Stiffness of right shoulder, not elsewhere classified: Secondary | ICD-10-CM | POA: Insufficient documentation
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
## 2022-05-26 ENCOUNTER — Encounter: Payer: Self-pay | Admitting: Hematology and Oncology

## 2022-05-30 ENCOUNTER — Encounter: Payer: Self-pay | Admitting: Hematology and Oncology

## 2022-05-30 ENCOUNTER — Other Ambulatory Visit: Payer: Self-pay

## 2022-05-30 ENCOUNTER — Inpatient Hospital Stay: Payer: 59 | Attending: Hematology and Oncology | Admitting: Hematology and Oncology

## 2022-05-30 ENCOUNTER — Ambulatory Visit: Payer: 59

## 2022-05-30 VITALS — BP 148/64 | HR 80 | Temp 98.8°F | Resp 18 | Ht <= 58 in | Wt 214.7 lb

## 2022-05-30 DIAGNOSIS — Z882 Allergy status to sulfonamides status: Secondary | ICD-10-CM | POA: Diagnosis not present

## 2022-05-30 DIAGNOSIS — E119 Type 2 diabetes mellitus without complications: Secondary | ICD-10-CM | POA: Insufficient documentation

## 2022-05-30 DIAGNOSIS — C50411 Malignant neoplasm of upper-outer quadrant of right female breast: Secondary | ICD-10-CM | POA: Insufficient documentation

## 2022-05-30 DIAGNOSIS — Z5986 Financial insecurity: Secondary | ICD-10-CM | POA: Insufficient documentation

## 2022-05-30 DIAGNOSIS — Z9049 Acquired absence of other specified parts of digestive tract: Secondary | ICD-10-CM | POA: Diagnosis not present

## 2022-05-30 DIAGNOSIS — F1290 Cannabis use, unspecified, uncomplicated: Secondary | ICD-10-CM | POA: Diagnosis not present

## 2022-05-30 DIAGNOSIS — Z923 Personal history of irradiation: Secondary | ICD-10-CM | POA: Insufficient documentation

## 2022-05-30 DIAGNOSIS — Z803 Family history of malignant neoplasm of breast: Secondary | ICD-10-CM | POA: Insufficient documentation

## 2022-05-30 DIAGNOSIS — Z87891 Personal history of nicotine dependence: Secondary | ICD-10-CM | POA: Diagnosis not present

## 2022-05-30 DIAGNOSIS — Z833 Family history of diabetes mellitus: Secondary | ICD-10-CM | POA: Insufficient documentation

## 2022-05-30 DIAGNOSIS — I110 Hypertensive heart disease with heart failure: Secondary | ICD-10-CM | POA: Diagnosis not present

## 2022-05-30 DIAGNOSIS — G4733 Obstructive sleep apnea (adult) (pediatric): Secondary | ICD-10-CM | POA: Diagnosis not present

## 2022-05-30 DIAGNOSIS — Z5941 Food insecurity: Secondary | ICD-10-CM | POA: Insufficient documentation

## 2022-05-30 DIAGNOSIS — Z818 Family history of other mental and behavioral disorders: Secondary | ICD-10-CM | POA: Insufficient documentation

## 2022-05-30 DIAGNOSIS — Z86711 Personal history of pulmonary embolism: Secondary | ICD-10-CM | POA: Insufficient documentation

## 2022-05-30 DIAGNOSIS — I2510 Atherosclerotic heart disease of native coronary artery without angina pectoris: Secondary | ICD-10-CM | POA: Insufficient documentation

## 2022-05-30 DIAGNOSIS — Z90710 Acquired absence of both cervix and uterus: Secondary | ICD-10-CM | POA: Diagnosis not present

## 2022-05-30 DIAGNOSIS — Z5111 Encounter for antineoplastic chemotherapy: Secondary | ICD-10-CM | POA: Diagnosis present

## 2022-05-30 DIAGNOSIS — E669 Obesity, unspecified: Secondary | ICD-10-CM | POA: Insufficient documentation

## 2022-05-30 DIAGNOSIS — Z880 Allergy status to penicillin: Secondary | ICD-10-CM | POA: Insufficient documentation

## 2022-05-30 DIAGNOSIS — E079 Disorder of thyroid, unspecified: Secondary | ICD-10-CM | POA: Insufficient documentation

## 2022-05-30 DIAGNOSIS — Z8249 Family history of ischemic heart disease and other diseases of the circulatory system: Secondary | ICD-10-CM | POA: Insufficient documentation

## 2022-05-30 DIAGNOSIS — Z79899 Other long term (current) drug therapy: Secondary | ICD-10-CM | POA: Insufficient documentation

## 2022-05-30 DIAGNOSIS — Z8261 Family history of arthritis: Secondary | ICD-10-CM | POA: Insufficient documentation

## 2022-05-30 DIAGNOSIS — Z171 Estrogen receptor negative status [ER-]: Secondary | ICD-10-CM | POA: Diagnosis not present

## 2022-05-30 DIAGNOSIS — Z823 Family history of stroke: Secondary | ICD-10-CM | POA: Insufficient documentation

## 2022-05-30 DIAGNOSIS — M25611 Stiffness of right shoulder, not elsewhere classified: Secondary | ICD-10-CM

## 2022-05-30 DIAGNOSIS — Z483 Aftercare following surgery for neoplasm: Secondary | ICD-10-CM

## 2022-05-30 DIAGNOSIS — I509 Heart failure, unspecified: Secondary | ICD-10-CM | POA: Insufficient documentation

## 2022-05-30 DIAGNOSIS — E785 Hyperlipidemia, unspecified: Secondary | ICD-10-CM | POA: Diagnosis not present

## 2022-05-30 DIAGNOSIS — Z885 Allergy status to narcotic agent status: Secondary | ICD-10-CM | POA: Insufficient documentation

## 2022-05-30 DIAGNOSIS — N6312 Unspecified lump in the right breast, upper inner quadrant: Secondary | ICD-10-CM | POA: Insufficient documentation

## 2022-05-30 DIAGNOSIS — Z8349 Family history of other endocrine, nutritional and metabolic diseases: Secondary | ICD-10-CM | POA: Insufficient documentation

## 2022-05-30 DIAGNOSIS — Z836 Family history of other diseases of the respiratory system: Secondary | ICD-10-CM | POA: Insufficient documentation

## 2022-05-30 DIAGNOSIS — Z800 Family history of malignant neoplasm of digestive organs: Secondary | ICD-10-CM | POA: Insufficient documentation

## 2022-05-30 DIAGNOSIS — I890 Lymphedema, not elsewhere classified: Secondary | ICD-10-CM

## 2022-05-30 DIAGNOSIS — Z811 Family history of alcohol abuse and dependence: Secondary | ICD-10-CM | POA: Insufficient documentation

## 2022-05-30 DIAGNOSIS — Z8673 Personal history of transient ischemic attack (TIA), and cerebral infarction without residual deficits: Secondary | ICD-10-CM | POA: Insufficient documentation
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
## 2022-05-30 NOTE — Progress Notes (Signed)
[place] NOTE  Patient Care Team: Sonia Side., FNP as PCP - General (Family Medicine) Josue Hector, MD as PCP - Cardiology (Cardiology) Mauro Kaufmann, RN as Oncology Nurse Navigator Rockwell Germany, RN as Oncology Nurse Navigator Donnie Mesa, MD as Consulting Physician (General Surgery) [name] Pike, MD as Consulting Physician (Hematology and Oncology) Tara Rudd, MD as Consulting Physician (Radiation Oncology)  CHIEF COMPLAINTS/PURPOSE OF CONSULTATION:  Breast cancer follow-up   HISTORY OF PRESENTING ILLNESS:   [name] 62 y.o. female is here because of recent diagnosis of right breast IDC  I reviewed her records extensively and collaborated the history with the patient.  SUMMARY OF ONCOLOGIC HISTORY: Oncology History  Malignant neoplasm of upper-outer quadrant of female breast ([place])  10/07/2021 Mammogram   Right breast, possible mass warrants further evaluation, no findings suspicious for malignancy in the left breast.Targeted ultrasound is performed, showing a 8 x 6 x 7 mm irregular mass at the right breast 1 o'clock 13 cm from nipple correlating to the mammographic mass. Ultrasound of the right axilla is negative.   10/27/2021 Pathology Results   Per verbal report at breast [place] today, grade 3 IDC, triple negative, Ki 67 25%   11/02/2021 Initial Diagnosis   Malignant neoplasm of upper-outer quadrant of female breast ([place])   11/21/2021 Genetic Testing   No pathogenic variants detected in [facility] CustomNext-Cancer +RNAinsight Panel.  Report date is November 21, 2021.   The CustomNext-Cancer+RNAinsight panel offered by [facility] includes sequencing and rearrangement analysis for the following 47 genes:  APC, ATM, AXIN2, BARD1, BMPR1A, BRCA1, BRCA2, BRIP1, CDH1, CDK4, CDKN2A, CHEK2, DICER1, EPCAM, GREM1, HOXB13, MEN1, MLH1, MSH2, MSH3, MSH6, MUTYH, NBN, NF1, NF2, NTHL1, PALB2, PMS2, POLD1, POLE, PTEN, RAD51C, RAD51D, RECQL, RET,  SDHA, SDHAF2, SDHB, SDHC, SDHD, SMAD4, SMARCA4, STK11, TP53, TSC1, TSC2, and VHL.  RNA data is routinely analyzed for use in variant interpretation for all genes.   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  Prior prognostic showed ER 0% negative PR 0% negative, HER2 low with IHC of 1+ and Ki-67 of 25%.   12/30/2021 -  Chemotherapy   Patient is on Treatment Plan : BREAST TC q21d      Interval History  She is here for a follow up after radiation.  She completed adjuvant radiation on 05/02/2022. She is going through a lot because some of her friends and family recently passed away. She has healed well through most of the chemotherapy adverse effects.  No neuropathy or LE swelling. Rest of the pertinent 10 point ROS reviewed and negative  MEDICAL HISTORY:  Past Medical History:  Diagnosis Date   Allergy    Anginal pain ([place])    a. NL cath in 2008;  b. Myoview 03/2011: dec uptake along mid anterior wall on stress imaging -> ? attenuation vs. ischemia, EF 65%;  c. 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 2008 -> coumadin x 6 mos.   Rheumatoid arthritis ([place])    Sleep apnea     on CPAP 02/2018   SOB (shortness of breath)    Thyroid disease    TIA (transient ischemic attack)    Urinary urgency    Vitamin D deficiency     SURGICAL HISTORY: Past Surgical History:  Procedure Laterality Date   ABDOMINAL HYSTERECTOMY  2005   APPENDECTOMY     BREAST LUMPECTOMY WITH RADIOACTIVE SEED AND SENTINEL LYMPH NODE BIOPSY Right 11/16/2021   Procedure: RIGHT BREAST RADIOACTIVE SEED LOCALIZED LUMPECTOMY WITH RIGHT AXILLARY SENTINEL LYMPH NODE BIOPSY;  Surgeon: Donnie Mesa, MD;  Location: [facility];  Service: General;  Laterality: Right;  LMA & PEC BLOCK   CARDIAC CATHETERIZATION     Normal   CARDIAC CATHETERIZATION N/A 09/30/2014   Procedure: Left Heart Cath and Coronary Angiography;  Surgeon: Sherren Mocha, MD;  Location: [facility];  Service: Cardiovascular;  Laterality: N/A;   LAPAROSCOPIC APPENDECTOMY N/A 06/03/2012   Procedure: APPENDECTOMY LAPAROSCOPIC;  Surgeon: Stark Klein, MD;  Location: [facility];  Service: General;  Laterality: N/A;   Left knee surgery  2008   LEG SURGERY     TONSILLECTOMY     TOTAL HIP ARTHROPLASTY Left 06/11/2018   Procedure: LEFT TOTAL HIP ARTHROPLASTY ANTERIOR APPROACH;  Surgeon: Meredith Pel, MD;  Location: [facility];  Service: Orthopedics;  Laterality: Left;   TOTAL KNEE ARTHROPLASTY Left 08/22/2016   Procedure: TOTAL KNEE ARTHROPLASTY;  Surgeon: Meredith Pel, MD;  Location: [facility];  Service: Orthopedics;  Laterality: Left;   TUBAL LIGATION  1989    SOCIAL HISTORY: Social History   Socioeconomic History   Marital status: Divorced    Spouse name: Not on file   Number of children: 3   Years of education: Not on file   Highest education level: Not on file  Occupational History   Occupation: UNEMPLOYED    Employer: DISABLED  Tobacco Use   Smoking status: Former    Packs/day: 0.50    Years: 15.00    Total pack years: 7.50    Types: Cigarettes    Quit date: 04/24/1985    Years since quitting: 37.1    Smokeless tobacco: Never  Vaping Use   Vaping Use: Never used  Substance and Sexual Activity   Alcohol use: No    Alcohol/week: 0.0 standard drinks of alcohol   Drug use: Not Currently    Types: Marijuana   Sexual activity: Not Currently    Birth control/protection: Surgical    Comment: hyst  Other Topics Concern   Not on file  Social History Narrative   Lives in [place] by herself.  Disabled.  Occasional Tea .  3 kids, 6 grandkids.     Water aerobics 3 times a week.   Social Determinants of Health   Financial Resource Strain: High Risk (03/27/2022)  Overall Financial Resource Strain (CARDIA)    Difficulty of Paying Living Expenses: Very hard  Food Insecurity: Food Insecurity Present (03/27/2022)   Hunger Vital Sign    Worried About Running Out of Food in the Last Year: Sometimes true    Ran Out of Food in the Last Year: Sometimes true  Transportation Needs: No Transportation Needs (03/27/2022)   PRAPARE - Hydrologist (Medical): No    Lack of Transportation (Non-Medical): No  Physical Activity: Not on file  Stress: Not on file  Social Connections: Not on file  Intimate Partner Violence: Not At Risk (03/28/2022)   Humiliation, Afraid, Rape, and Kick questionnaire    Fear of Current or Ex-Partner: No    Emotionally Abused: No    Physically Abused: No    Sexually Abused: No    FAMILY HISTORY: Family History  Problem Relation Age of Onset   Emphysema Mother    Arthritis Mother    Heart failure Mother        alive @ 41   Stroke Mother    Diabetes Mother    Hypertension Mother    Hyperlipidemia Mother    Depression Mother    Anxiety disorder Mother    Heart disease Mother    Alcoholism Mother    Depression Father    Heart disease Father        died @ 41's.   Stroke Father    Diabetes Father    Hyperlipidemia Father    Hypertension Father    Bipolar disorder Father    Sleep apnea Father    Alcoholism Father    Drug abuse Father     Sudden death Father    Diabetes Sister    Asthma Brother    Breast cancer Maternal Aunt 45 - 2   Colon cancer Maternal Grandfather        dx after 70   Heart disease Paternal Grandfather    Breast cancer Cousin        dx 35s   Esophageal cancer Neg Hx    Stomach cancer Neg Hx    Rectal cancer Neg Hx     ALLERGIES:  is allergic to hydrocodone-acetaminophen, penicillins, shellfish allergy, latex, and sulfa antibiotics.  MEDICATIONS:  Current Outpatient Medications  Medication Sig Dispense Refill   ACCU-CHEK GUIDE test strip      acetaminophen (TYLENOL) 500 MG tablet Take 1,000 mg by mouth 2 (two) times daily as needed for moderate pain.     albuterol (PROVENTIL HFA;VENTOLIN HFA) 108 (90 Base) MCG/ACT inhaler Inhale 2 puffs into the lungs every 6 (six) hours as needed for wheezing or shortness of breath.     aspirin 81 MG chewable tablet Chew 81 mg by mouth daily.     atorvastatin (LIPITOR) 40 MG tablet Take 40 mg by mouth at bedtime.      busPIRone (BUSPAR) 30 MG tablet Take 30 mg by mouth 2 (two) times daily.     carvedilol (COREG) 6.25 MG tablet Take 1 tablet (6.25 mg total) by mouth 2 (two) times daily. 180 tablet 3   diclofenac Sodium (VOLTAREN) 1 % GEL Apply 2 g topically 4 (four) times daily as needed (joint/muscle pain).     doxycycline (VIBRA-TABS) 100 MG tablet Take 1 tablet (100 mg total) by mouth 2 (two) times daily. 20 tablet 0   empagliflozin (JARDIANCE) 10 MG TABS tablet Take 1 tablet (10 mg total) by mouth daily. 30 tablet 0   EPINEPHrine 0.3  mg/0.3 mL IJ SOAJ injection Inject 0.3 mg into the muscle as needed for anaphylaxis. Follow package instructions as needed for severe allergy or anaphylactic reaction. 2 each 3   furosemide (LASIX) 40 MG tablet Take 1 tablet (40 mg total) by mouth daily as needed for edema or fluid (take in case of weight gain 2 to 3 lbs in 24 hrs or 5 lbs in 7 days.). 30 tablet 0   hydrOXYzine (VISTARIL) 25 MG capsule Take 25 mg by mouth 3 (three)  times daily as needed for itching.     lidocaine (LIDODERM) 5 % Place 1 patch onto the skin daily as needed for pain.     lidocaine-prilocaine (EMLA) cream Apply to affected area once (Patient taking differently: Apply 1 Application topically once. Apply to affected area once) 30 g 3   loperamide (IMODIUM) 2 MG capsule Take 1 capsule (2 mg total) by mouth 4 (four) times daily as needed for diarrhea or loose stools. 12 capsule 0   losartan (COZAAR) 25 MG tablet Take 1 tablet (25 mg total) by mouth daily. 30 tablet 0   metFORMIN (GLUCOPHAGE) 500 MG tablet Take 1 tablet (500 mg total) by mouth daily with breakfast. 90 tablet 0   nitroGLYCERIN (NITROSTAT) 0.4 MG SL tablet Place 1 tablet (0.4 mg total) under the tongue every 5 (five) minutes as needed for chest pain. 25 tablet 3   nystatin (MYCOSTATIN/NYSTOP) powder Apply 1 application topically 3 (three) times daily as needed for rash.     ondansetron (ZOFRAN-ODT) 4 MG disintegrating tablet Take 1 tablet (4 mg total) by mouth every 8 (eight) hours as needed for nausea or vomiting. 20 tablet 0   oxyCODONE (OXY IR/ROXICODONE) 5 MG immediate release tablet Take 1 tablet (5 mg total) by mouth every 6 (six) hours as needed for severe pain. 30 tablet 0   pantoprazole (PROTONIX) 40 MG tablet Take 1 tablet (40 mg total) by mouth 2 (two) times daily. (Patient taking differently: Take 40 mg by mouth daily.) 60 tablet 0   prochlorperazine (COMPAZINE) 10 MG tablet Take 1 tablet (10 mg total) by mouth every 6 (six) hours as needed for nausea or vomiting. 30 tablet 1   sertraline (ZOLOFT) 25 MG tablet Take 1 tablet (25 mg total) by mouth daily. 90 tablet 1   spironolactone (ALDACTONE) 25 MG tablet Take 1/2 tablet (12.5 mg total) by mouth daily. 15 tablet 0   Vitamin D, Ergocalciferol, (DRISDOL) 1.25 MG (50000 UNIT) CAPS capsule Take 1 capsule (50,000 Units total) by mouth every 7 (seven) days. 4 capsule 0   No current facility-administered medications for this visit.     PHYSICAL EXAMINATION: ECOG PERFORMANCE STATUS: 1 - Symptomatic but completely ambulatory  Vitals:   05/30/22 1314  BP: (!) 148/64  Pulse: 80  Resp: 18  Temp: 98.8 F (37.1 C)  SpO2: 100%     Filed Weights   05/30/22 1314  Weight: 214 lb 11.2 oz (97.4 kg)    Physical Exam Constitutional:      Appearance: Normal appearance.  Cardiovascular:     Rate and Rhythm: Normal rate and regular rhythm.  Pulmonary:     Effort: Pulmonary effort is normal.     Breath sounds: Normal breath sounds.  Chest:     Comments: Right breast with some recent related radiation changes  Abdominal:     General: Abdomen is flat.     Palpations: Abdomen is soft.  Musculoskeletal:        General: Swelling:  Bilateral symmetrical lower extremity swelling improved/resolved.     Cervical back: Normal range of motion and neck supple. No rigidity.  Lymphadenopathy:     Cervical: No cervical adenopathy.  Skin:    General: Skin is warm and dry.  Neurological:     General: No focal deficit present.     Mental Status: She is alert.      LABORATORY DATA:  I have reviewed the data as listed Lab Results  Component Value Date   WBC 4.3 03/24/2022   HGB 9.1 (L) 03/24/2022   HCT 29.5 (L) 03/24/2022   MCV 96.4 03/24/2022   PLT 184 03/24/2022   Lab Results  Component Value Date   NA 142 04/18/2022   K 4.1 04/18/2022   CL 107 (H) 04/18/2022   CO2 20 04/18/2022    RADIOGRAPHIC STUDIES: I have personally reviewed the radiological reports and agreed with the findings in the report.  ASSESSMENT AND PLAN:   Malignant neoplasm of upper-outer quadrant of female breast [facility]) This is a very pleasant 62 year old female patient with past medical history significant for congestive heart failure, coronary artery disease, diabetes, hypertension, dyslipidemia history of pulmonary embolism back in 2008 for which she needed anticoagulation, rheumatoid arthritis, obstructive sleep apnea who is newly  diagnosed with right breast IDC noticed on screening mammogram.    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  She is now s/p 4 cycles of TC and adjuvant radiation. No role for anti estrogen therapy since she had triple neg breast cancer. She will follow up with survivorship visit in 3 months with Mendel Ryder. Mammogram due in June 2024, ordered. RTC with me in 6 months.    Total time spent: 30 minutes including history, physical exam, review of records, counseling and coordination of care All questions were answered. The patient knows to call the clinic with any problems, questions or concerns.    [name] Pike, MD 05/30/22

## 2022-05-30 NOTE — Assessment & Plan Note (Addendum)
This is a very pleasant 62 year old female patient with past medical history significant for congestive heart failure, coronary artery disease, diabetes, hypertension, dyslipidemia history of pulmonary embolism back in 2008 for which she needed anticoagulation, rheumatoid arthritis, obstructive sleep apnea who is newly diagnosed with right breast IDC noticed on screening mammogram.    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  She is now s/p 4 cycles of TC and adjuvant radiation. No role for anti estrogen therapy since she had triple neg breast cancer. She will follow up with survivorship visit in 3 months with Mendel Ryder. Mammogram due in June 2024, ordered. RTC with me in 6 months.

## 2022-06-01 ENCOUNTER — Ambulatory Visit: Payer: 59

## 2022-06-05 ENCOUNTER — Ambulatory Visit
Admission: RE | Admit: 2022-06-05 | Discharge: 2022-06-05 | Disposition: A | Discharge status: Home / Self Care | Payer: 59 | Source: Ambulatory Visit | Attending: Radiation Oncology | Admitting: Radiation Oncology

## 2022-06-05 NOTE — Progress Notes (Addendum)
  Radiation Oncology         [phone] ________________________________  Name: [name] MRN: [id number]  Date of Service: 06/05/2022  DOB: 12-Jan-1961  Post Treatment Telephone Note  Diagnosis:  Stage IB, [id number], grade 3, triple negative invasive ductal carcinoma of the right breast   Intent: Curative  Radiation Treatment Dates: 04/03/2022 through 05/02/2022 Site Technique Total Dose (Gy) Dose per Fx (Gy) Completed Fx Beam Energies  Breast, Right: Breast_R 3D 42.56/42.56 2.66 16/16 10XFFF  Breast, Right: Breast_R_Bst specialPort 8/8 2 4/4 12E   (as documented in provider EOT note)   The patient was not available for call today. A detailed voicemail was left.  The patient was encouraged to avoid sun exposure in the area of prior treatment for up to one year following radiation with either sunscreen or by the style of clothing worn in the sun.  The patient has NOT scheduled a follow up with her medical oncologist Dr. Chryl Heck for ongoing surveillance, and was encouraged to call if she develops concerns or questions regarding radiation.   Leandra Kern, LPN

## 2022-06-06 ENCOUNTER — Ambulatory Visit: Payer: 59

## 2022-06-06 DIAGNOSIS — C50411 Malignant neoplasm of upper-outer quadrant of right female breast: Secondary | ICD-10-CM | POA: Diagnosis not present

## 2022-06-06 DIAGNOSIS — Z483 Aftercare following surgery for neoplasm: Secondary | ICD-10-CM

## 2022-06-06 DIAGNOSIS — Z171 Estrogen receptor negative status [ER-]: Secondary | ICD-10-CM

## 2022-06-06 DIAGNOSIS — I890 Lymphedema, not elsewhere classified: Secondary | ICD-10-CM

## 2022-06-06 DIAGNOSIS — M25611 Stiffness of right shoulder, not elsewhere classified: Secondary | ICD-10-CM

## 2022-06-06 DIAGNOSIS — R293 Abnormal posture: Secondary | ICD-10-CM
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
## 2022-06-08 ENCOUNTER — Ambulatory Visit: Payer: 59

## 2022-06-08 DIAGNOSIS — Z171 Estrogen receptor negative status [ER-]: Secondary | ICD-10-CM

## 2022-06-08 DIAGNOSIS — M25611 Stiffness of right shoulder, not elsewhere classified: Secondary | ICD-10-CM

## 2022-06-08 DIAGNOSIS — R293 Abnormal posture: Secondary | ICD-10-CM

## 2022-06-08 DIAGNOSIS — C50411 Malignant neoplasm of upper-outer quadrant of right female breast: Secondary | ICD-10-CM | POA: Diagnosis not present

## 2022-06-08 DIAGNOSIS — I890 Lymphedema, not elsewhere classified: Secondary | ICD-10-CM

## 2022-06-08 DIAGNOSIS — Z483 Aftercare following surgery for neoplasm: Secondary | ICD-10-CM
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
## 2022-06-12 ENCOUNTER — Encounter: Payer: Self-pay | Admitting: Hematology and Oncology

## 2022-06-13 ENCOUNTER — Ambulatory Visit: Payer: 59

## 2022-06-13 ENCOUNTER — Encounter: Payer: Self-pay | Admitting: General Practice

## 2022-06-13 DIAGNOSIS — Z483 Aftercare following surgery for neoplasm: Secondary | ICD-10-CM

## 2022-06-13 DIAGNOSIS — R293 Abnormal posture: Secondary | ICD-10-CM

## 2022-06-13 DIAGNOSIS — M25611 Stiffness of right shoulder, not elsewhere classified: Secondary | ICD-10-CM

## 2022-06-13 DIAGNOSIS — I890 Lymphedema, not elsewhere classified: Secondary | ICD-10-CM

## 2022-06-13 DIAGNOSIS — C50411 Malignant neoplasm of upper-outer quadrant of right female breast: Secondary | ICD-10-CM | POA: Diagnosis not present

## 2022-06-13 DIAGNOSIS — Z171 Estrogen receptor negative status [ER-]: Secondary | ICD-10-CM

## 2022-06-13 NOTE — Progress Notes (Signed)
[facility] Spiritual Care Note  Referred by PT for additional layer of emotional support. Reached [name] by phone, introducing [facility] programming resources and Spiritual Care as part of her support team.  We scheduled an in-person visit in [place] office for Thursday 2/22 at 10:30. Will also email her [employer] flyers to help meet her goal of getting out of the house for meaningful activities as she copes with grief after several close losses.   [place], [place], [facility] Pager [phone] Voicemail [phone]
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
## 2022-06-15 ENCOUNTER — Inpatient Hospital Stay: Payer: 59 | Admitting: General Practice

## 2022-06-15 ENCOUNTER — Encounter: Payer: Self-pay | Admitting: General Practice

## 2022-06-15 ENCOUNTER — Ambulatory Visit: Payer: 59

## 2022-06-15 NOTE — Progress Notes (Signed)
[facility] Spiritual Care Note  Ms Tikkanen left voicemail to cancel appointment because of not feeling well and seeking medical attention. She plans to follow up when feeling better.   [place], [place], [facility] Pager [phone] Voicemail [phone]

## 2022-06-21 ENCOUNTER — Ambulatory Visit: Payer: Medicare Other | Admitting: Hematology and Oncology

## 2022-06-22 ENCOUNTER — Ambulatory Visit: Payer: 59

## 2022-06-22 DIAGNOSIS — R293 Abnormal posture: Secondary | ICD-10-CM

## 2022-06-22 DIAGNOSIS — M25611 Stiffness of right shoulder, not elsewhere classified: Secondary | ICD-10-CM

## 2022-06-22 DIAGNOSIS — Z171 Estrogen receptor negative status [ER-]: Secondary | ICD-10-CM

## 2022-06-22 DIAGNOSIS — I890 Lymphedema, not elsewhere classified: Secondary | ICD-10-CM

## 2022-06-22 DIAGNOSIS — Z483 Aftercare following surgery for neoplasm: Secondary | ICD-10-CM

## 2022-06-22 DIAGNOSIS — C50411 Malignant neoplasm of upper-outer quadrant of right female breast: Secondary | ICD-10-CM | POA: Diagnosis not present
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
## 2022-06-26 ENCOUNTER — Encounter: Payer: Self-pay | Admitting: General Practice

## 2022-06-26 NOTE — Progress Notes (Signed)
[facility] Spiritual Care Note  Received and returned call from Ms Malcolm, rescheduling her in-person Spiritual Care appointment to Wednesday 3/7 at 1:30 pm.   Suzette Battiest, [place], [facility] Pager [phone] Voicemail [phone]

## 2022-06-29 ENCOUNTER — Inpatient Hospital Stay: Payer: 59 | Admitting: General Practice

## 2022-06-29 ENCOUNTER — Ambulatory Visit: Payer: 59 | Attending: Hematology and Oncology

## 2022-06-29 ENCOUNTER — Encounter: Payer: Self-pay | Admitting: General Practice

## 2022-06-29 DIAGNOSIS — M25611 Stiffness of right shoulder, not elsewhere classified: Secondary | ICD-10-CM | POA: Insufficient documentation

## 2022-06-29 DIAGNOSIS — I890 Lymphedema, not elsewhere classified: Secondary | ICD-10-CM | POA: Diagnosis present

## 2022-06-29 DIAGNOSIS — Z483 Aftercare following surgery for neoplasm: Secondary | ICD-10-CM | POA: Diagnosis present

## 2022-06-29 DIAGNOSIS — C50411 Malignant neoplasm of upper-outer quadrant of right female breast: Secondary | ICD-10-CM | POA: Insufficient documentation

## 2022-06-29 DIAGNOSIS — R293 Abnormal posture: Secondary | ICD-10-CM | POA: Diagnosis present

## 2022-06-29 DIAGNOSIS — Z171 Estrogen receptor negative status [ER-]: Secondary | ICD-10-CM | POA: Insufficient documentation

## 2022-06-29 NOTE — Progress Notes (Signed)
[facility] Spiritual Care Note  [name] requested in advance to cancel appointment due to family transportation complications. She plans to phone again to reschedule.   [place], [place], [facility] Pager [phone] Voicemail [phone]

## 2022-06-29 NOTE — Therapy (Addendum)
OUTPATIENT PHYSICAL THERAPY BREAST CANCER RE-EVAL   Patient Name: [name] MRN: [id number] DOB:02/23/1961, 62 y.o., female Today's Date: 06/29/2022   PT End of Session - 06/29/22 1319     Visit Number 9    Number of Visits 13    Date for PT Re-Evaluation 07/27/22    PT Start Time 1216    PT Stop Time 1314    PT Time Calculation (min) 58 min    Activity Tolerance Patient tolerated treatment well    Behavior During Therapy [facility] for tasks assessed/performed              Past Medical History:  Diagnosis Date   Allergy    Anginal pain ([place])    a. NL cath in 2008;  b. Myoview 03/2011: dec uptake along mid anterior wall on stress imaging -> ? attenuation vs. ischemia, EF 65%;  c. Echo 04/2011: EF 55-60%, no RWMA, Gr 2 dd   Anxiety    Arthritis    Asthma    Back pain    Bone cancer ([place])    Cancer ([place])    Chest pain    CHF (congestive heart failure) ([place])    Clotting disorder ([place])    Constipation    Depression    Diabetes mellitus    Drug use    Dyspnea    Frequent urination    GERD (gastroesophageal reflux disease)    Glaucoma    History of stomach ulcers    HLD (hyperlipidemia)    Hypertension    IBS (irritable bowel syndrome)    Joint pain    Joint pain    Lactose intolerance    Leg edema    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 benign thymoma   Neuromuscular disorder ([place])    Obesity    Palpitations    Pneumonia 05/2016   double   Pulmonary edema    Pulmonary embolism ([place])    a. 2008 -> coumadin x 6 mos.   Rheumatoid arthritis ([place])    Sleep apnea    on CPAP 02/2018   SOB (shortness of breath)    Thyroid disease    TIA (transient ischemic attack)    Urinary urgency    Vitamin D deficiency    Past Surgical History:  Procedure Laterality Date   ABDOMINAL HYSTERECTOMY  2005   APPENDECTOMY     BREAST LUMPECTOMY WITH RADIOACTIVE SEED AND SENTINEL LYMPH NODE BIOPSY Right 11/16/2021   Procedure: RIGHT BREAST RADIOACTIVE SEED LOCALIZED  LUMPECTOMY WITH RIGHT AXILLARY SENTINEL LYMPH NODE BIOPSY;  Surgeon: Tara Mesa, MD;  Location: [place];  Service: General;  Laterality: Right;  LMA & PEC BLOCK   CARDIAC CATHETERIZATION     Normal   CARDIAC CATHETERIZATION N/A 09/30/2014   Procedure: Left Heart Cath and Coronary Angiography;  Surgeon: Tara Mocha, MD;  Location: [facility];  Service: Cardiovascular;  Laterality: N/A;   LAPAROSCOPIC APPENDECTOMY N/A 06/03/2012   Procedure: APPENDECTOMY LAPAROSCOPIC;  Surgeon: Tara Klein, MD;  Location: [place];  Service: General;  Laterality: N/A;   Left knee surgery  2008   LEG SURGERY     TONSILLECTOMY     TOTAL HIP ARTHROPLASTY Left 06/11/2018   Procedure: LEFT TOTAL HIP ARTHROPLASTY ANTERIOR APPROACH;  Surgeon: Tara Pel, MD;  Location: [facility];  Service: Orthopedics;  Laterality: Left;   TOTAL KNEE ARTHROPLASTY Left 08/22/2016   Procedure: TOTAL KNEE ARTHROPLASTY;  Surgeon: Tara Pel, MD;  Location: [place] [place];  Service: Orthopedics;  Laterality: Left;   TUBAL LIGATION  1989   Patient Active Problem List   Diagnosis Date Noted   Heart failure ([place]) 03/25/2022   Chest pain 03/24/2022   Multiple thyroid nodules 11/28/2021   Genetic testing 11/22/2021   Family history of breast cancer 11/03/2021   Family history of colon cancer 11/03/2021   Malignant neoplasm of upper-outer quadrant of female breast ([place]) 11/02/2021   Back pain 06/01/2021   Right thyroid nodule 05/31/2021   At risk for impaired metabolic function [id number]   Unilateral primary osteoarthritis, left hip    Hip arthritis 06/11/2018   Class 3 severe obesity with serious comorbidity and body mass index (BMI) of 40.0 to 44.9 in adult ([place]) 05/29/2018   Other fatigue 11/20/2017   Shortness of breath on exertion 11/20/2017   Type 2 diabetes mellitus with hyperlipidemia ([place]) 11/20/2017   Vitamin D deficiency 11/20/2017   Depression 11/20/2017   Other hyperlipidemia 11/20/2017    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/04/2016   Presence of left artificial knee joint 09/20/2016   Arthritis of knee 08/22/2016   Cervical radiculopathy 07/26/2016   Cervical disc disorder with radiculopathy 07/26/2016   Right arm pain 06/29/2016   Cervicalgia 06/29/2016   Primary osteoarthritis of left knee 06/29/2016   Hypersomnia with sleep apnea 11/18/2015   Lethargy 11/18/2015   Exertional chest pain 09/30/2014   Abnormal cardiac function test 09/29/2014   Chest pain with moderate risk for cardiac etiology 09/28/2014   HTN (hypertension) 09/28/2014   AKI (acute kidney injury) ([place])    Paresthesia 06/18/2014   Numbness and tingling of left arm and leg    Unstable angina ([place]) 05/24/2014   OSA on CPAP 05/24/2014   CAD (coronary artery disease) 11/25/2012   S/P laparoscopic appendectomy 06/04/2012   Acute on chronic diastolic CHF (congestive heart failure) ([place]) 01/09/2012   Mediastinal mass 01/09/2012   SOB (shortness of breath) 06/20/2011   Mediastinal abnormality 06/20/2011   Dyslipidemia 12/23/2009   GLAUCOMA 12/23/2009   ARTHRITIS 12/23/2009   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 FNP   REFERRING PROVIDER: Dr. Georgette Barrera   REFERRING DIAG: Rt breast cancer  THERAPY DIAG:  Malignant neoplasm of upper-outer quadrant of right breast in female, estrogen receptor negative ([place])  Lymphedema, not elsewhere classified  Aftercare following surgery for neoplasm  Stiffness of right shoulder, not elsewhere classified  Abnormal  posture  Rationale for Evaluation and Treatment Rehabilitation  ONSET DATE: 10/28/21  SUBJECTIVE:  SUBJECTIVE STATEMENT:  I tried the pump on Friday and I really connected with Lattie Haw. I didn't try the one with the chest piece. Breast swelling and heaviness is 50% better overall. I think my shoulder ROM is doing pretty well, but the cording is still there and there s still a cord under my breast. They don't bother me much, but I can still feel them.   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 Other hx includes DM, CHF, hx of stroke, anxiety.  She completed Chemotherapy and She finished radiation 05/02/2022.  Had an axillary seroma drained x 4.  She was hospitalized Dec 1st through the 5th for fluid in her legs and lungs, and she had some PT at home after the hospitalization about 2 times.  PATIENT GOALS:  Reassess how my recovery is going related to arm function, pain, and swelling.  PAIN:  Are you having pain? No not presently, but was having breast pain before the antibiotics  PRECAUTIONS: Recent Surgery, right UE Lymphedema risk, DM, CHF, hx of CVA,anxiety  ACTIVITY LEVEL / LEISURE: reading, TV    OBJECTIVE:   PATIENT SURVEYS:  QUICK DASH:not given  BREAST COMPLAINTS QUESTIONNAIRE( EVAL) Pain:2 Heaviness:6 Swollen feeling:9 Tense Skin:5 Redness:9 Bra Print:6 Size of Pores:8 Hard feeling: 8 Total:    53 /80 A Score over 9 indicates lymphedema issues in the breast 37 today (3/7/20240 BREAST COMPLAINTS QUESTIONNAIRE (06/29/2022 Pain:3 Heaviness:7 Swollen feeling:7 Tense Skin:4 Redness:2 Bra Print:2 Size of Pores:5 Hard feeling: 2 Total:   32  /80 A Score over 9 indicates lymphedema issues in the  breast    OBSERVATIONS: EVAL: Large breasted with noted generalized increase in swelling on the right. Hyperpigmentation from radiation and mild skin sloughing from area in axilla that was burned. 06/29/2022 generalized swelling but improved with enlarged pores still present. Several small cords still present in axilla/upper arm but not interfering with ROM. Mild fibrosis noted mainly at medial breast. Small nodular area noted prox to nipple. Advised pt to have it checked EVAL:Several small cords noted in axilla and several larger cords noted in right breast. Fibrosis noted at proximal lateral nipple and mildly medial breast. Pt has been wearing her compression bra and it does feel better   POSTURE:  Forward head and rounded shoulders posture   UPPER EXTREMITY AROM/PROM:   A/PROM RIGHT   eval 11/11/2021  01/11/22 05/17/2022 RE-eval RIGHT 06/06/2021 RIGHT 06/29/2022  Shoulder extension 48 60 55 60   Shoulder flexion 132pn 135pn 119/pulls 132 132  Shoulder abduction 135pn 135 130/ pulls 130 pulls 134  Shoulder internal rotation        Shoulder external rotation 78pn in elbow  80 89/pulls 92                           (Blank rows = not tested)   A/PROM LEFT   eval LEFT 06/06/2022  Shoulder extension 55   Shoulder flexion 153 133  Shoulder abduction 145 137  Shoulder internal rotation     Shoulder external rotation 85                           (Blank rows = not tested)     CERVICAL AROM: All within functional limits:    LYMPHEDEMA ASSESSMENTS:    LANDMARK RIGHT   eval 01/11/22 05/17/2022  10 cm  proximal to olecranon process 36 37 37.5  Olecranon process 29 29.5 29.5  10 cm proximal to ulnar styloid process 23.5 23.9 23.9  Just proximal to ulnar styloid process 17.5 17.4 17.2  Across hand at thumb web space 20.0 19.4 19.7  At base of 2nd digit 7.3 7.0 6.9  (Blank rows = not tested)   LANDMARK LEFT   eval 01/11/22 05/17/2022  10 cm proximal to olecranon process 35.2 36.5 38.2   Olecranon process 30.5 30.5 29.6  10 cm proximal to ulnar styloid process 23.5 23.5 24.6  Just proximal to ulnar styloid process 17 17 17.7  Across hand at thumb web space 20.3 20.5 19.9  At base of 2nd digit 6.9 6.9 6.9  (Blank rows = not tested)    TREATMENT TODAY   06/29/2022 Discussed goals and pt filled out Breast Complaints Survey MFR to right axillary region, upper arm,  area of cording, PROM right shoulder flexion, scaption, abd, ER In supine head of table elevated: Short neck, 5 diaphragmatic breaths, L axillary nodes and establishment of interaxillary pathway, R inguinal nodes and establishment of axilloinguinal pathway, then R breast moving fluid towards pathways spending extra time in any areas of fibrosis then retracing all steps. Measured ROM  06/22/2022  MFR to right axillary region, upper arm, forearm area of cording, PROM right shoulder flexion, scaption, abd, ER In supine head of table elevated: Short neck, 5 diaphragmatic breaths, L axillary nodes and establishment of interaxillary pathway, R inguinal nodes and establishment of axilloinguinal pathway, then R breast moving fluid towards pathways spending extra time in any areas of fibrosis then retracing all steps.   06/13/2022  MFR to right axillary region, upper arm, forearm area of cording, PROM right shoulder flexion, scaption, abd, ER In supine head of table elevated: Short neck, 5 diaphragmatic breaths, L axillary nodes and establishment of interaxillary pathway, R inguinal nodes and establishment of axilloinguinal pathway, then R breast moving fluid towards pathways spending extra time in any areas of fibrosis then retracing all steps.   06/08/2022 MFR to right axillary region, upper arm area of cording, PROM right shoulder flexion, scaption, abd, ER In supine head of table elevated: Short neck, 5 diaphragmatic breaths, L axillary nodes and establishment of interaxillary pathway, R inguinal nodes and  establishment of axilloinguinal pathway, then R breast moving fluid towards pathways spending extra time in any areas of fibrosis then retracing all steps. Verbally reviewed with pt while demonstrating and had pt. Practice LN's, pathways and breast. In supine with table elevated and in sitting. Messaged Lorrin Jackson while pt was here because she is interested in talking with someone, as she has had several losses of close friends recently. Pt Ok'd me messaging Lattie Haw   06/06/2022  MFR to right axillary region, upper arm area of cording, PROM right shoulder flexion, scaption, abd, ER In supine head of table elevated: Short neck, 5 diaphragmatic breaths, L axillary nodes and establishment of interaxillary pathway, R inguinal nodes and establishment of axilloinguinal pathway, then R breast moving fluid towards pathways spending extra time in any areas of fibrosis then retracing all steps. Verbally reviewed with pt while demonstrating. Checked left axillary region at pt request. There does appear to be more tissue on the left possibly due to axillary incision and radiation on the right. She has not had surgery on the left,  05/30/2022 Soft tissue mobilization to right UT, pectorals and lats with cocoa butter MFR to right axillary region, upper arm area of cording,  PROM right shoulder flexion, scaption, abd, ER In supine head of table elevated: Short neck, 5 diaphragmatic breaths, L axillary nodes and establishment of interaxillary pathway, R inguinal nodes and establishment of axilloinguinal pathway, then R breast moving fluid towards pathways spending extra time in any areas of fibrosis then retracing all steps.Pt instructed in and practiced several steps briefly, doing very well overall but having some difficulty when reaching across to the opposite side.     05/25/2022 Foam pad in TG soft made to place under left  lower border of bra to prevent irritation and rolling Supine  scaption x5 with wand MFR  to right axillary region, upper arm area of cording PROM right shoulder flexion, scaption, abd, ER In supine head of table elevated: Short neck, 5 diaphragmatic breaths, L axillary nodes and establishment of interaxillary pathway, R inguinal nodes and establishment of axilloinguinal pathway, then R breast moving fluid towards pathways spending extra time in any areas of fibrosis then retracing all steps.Pt instructed in and practiced all steps briefly, doing very well overall but having some difficulty when reaching across to the opposite side. Cording noted under the breast today Pt given written instructions for MLD to right breast    05/23/2022 Foam pad in TG soft made to place under right lower border of bra to prevent irritation and rolling Supine wand flex and scaption x5 MFR to right axillary region, upper arm area of cording PROM right shoulder flexion, scaption, abd, ER In supine head of table elevated: Short neck, 5 diaphragmatic breaths, L axillary nodes and establishment of interaxillary pathway, R inguinal nodes and establishment of axilloinguinal pathway, then R breast moving fluid towards pathways spending extra time in any areas of fibrosis then retracing all steps.Pt instructed in anatomy and physiology of lymphatic system.    PATIENT EDUCATION: 05/17/2022 (RW) Education details:reviewed 4 post op exercises given by K. Tevis on 01/11/2022, wand for flexion, wall climb, scapular retraction and stargazer in supine Person educated: Patient Education method: Explanation, Demonstration, Verbal cues, and Handouts Education comprehension: verbalized understanding and returned demonstration  HOME EXERCISE PROGRAM:  Reviewed previously given post op HEP. Access Code: [email or web address] URL: [email or web address] Date: 01/11/2022 Prepared by: Shan Levans  Exercises - Supine Shoulder Flexion with Dowel  - 1 x daily - 7 x weekly - 1-3 sets - 10 reps - 5 seconds hold - Supine Chest  Stretch with Elbows Bent  - 1 x daily - 7 x weekly - 1 sets - 3 reps - 30-60seconds hold - Standing Shoulder Abduction Slides at Wall  - 1 x daily - 7 x weekly - 1-3 sets - 5 reps - 5 seconds hold - Seated Scapular Retraction  - 1 x daily - 7 x weekly - 1-3 sets - 10 reps - 2-3 seconds hold  ASSESSMENT:  CLINICAL IMPRESSION: Pt has achieved 3/4 goals established. She is independent with self breast MLD and notes decrease in swelling/heaviness by 50% overall. Her shoulder ROM is now WNL. She has improved from 54 to 32 on the Breast Complaints survey, but has not yet achieved goal. She still has some mild cording that is not interfering with her motion. Patient has had greater than 4 weeks of conservative therapies including being compliant with compression, exercise, self MLD and elevation. Despite her improvements she  continues to have increased Right Breast swelling with  pain and tenderness, hyper pigmentation, enlarged pores and fibrosis in the right breast that has been resistant to change.  Breast complaints Survey continues  to remain high indicative of significant lymphedema which affects the pts overall function. She would benefit from a Flexi touch sequential compression pump to decrease pain and swelling, which in turn helps her to prevent infection and further possible medical intervention.  PT treatment/interventions: ADL/Self care home management, Patient/Family education, MLD, therapeutic exercise, manual techniques, compression pump?     GOALS: Goals reviewed with patient? Yes  LONG TERM GOALS:  (STG=LTG)  GOALS Name Target Date  Goal status  1 Pt will demonstrate she has regained full shoulder ROM and function post operatively compared to baselines.  Baseline:  06/28/2022  MET 06/29/2022  2 Pt will have decreased right breast swelling/heaviness by 50% or more 06/28/2022 MET  06/29/2022  3 Pt will be independent in self MLD of the right breast 06/28/2022 MET 06/29/2022  4 Breast  Complaints survey will be decreased to no greater than 15 to show improvement in symptoms 06/28/2022 IN Progress 32 today     PLAN: PT FREQUENCY/DURATION: 1x/week x 4 weeks prn  PLAN FOR NEXT SESSION: pt is on hold but dates extended in case she needs to return. Emailed Megan to see if they can try the regular Flexitouch with her and not just the basic as was done last week.  [facility]  [address], [address]  [place] [address]  [phone]  Claris Pong, PT 06/29/2022, 1:20 PM

## 2022-07-06 ENCOUNTER — Telehealth: Payer: Self-pay | Admitting: Hematology and Oncology

## 2022-07-06 ENCOUNTER — Telehealth: Payer: Self-pay | Admitting: *Deleted

## 2022-07-06 NOTE — Telephone Encounter (Signed)
Spoke with patient confirming upcoming appointments  

## 2022-07-06 NOTE — Telephone Encounter (Signed)
Patient called. Has developed small "knot" in right breast, close to underarm. Started last week. Size of knot has increased and is now tender. She wants to get it checked.  Dr. Chryl Heck informed.  Per Dr. Chryl Heck, may schedule with Wilber Bihari, NP first available.  Contacted patient with this information to let her know office could schedule her with NP next week. Patient states that since she spoke with office originally, she's now made an appt with Dr. Chryl Heck for Monday 07/09/21.

## 2022-07-09 IMAGING — CT CT ANGIO CHEST
2 of 7 series · 18 of 46 positions shown · IV contrast (omnipaque)
Comparison: Chest radiograph September 03, 2020; chest CT May 19, 2020

CLINICAL DATA: Shortness of breath

EXAM:
CT ANGIOGRAPHY CHEST WITH CONTRAST
TECHNIQUE: Multidetector CT imaging of the chest was performed using the
standard protocol during bolus administration of intravenous
contrast. Multiplanar CT image reconstructions and MIPs were
obtained to evaluate the vascular anatomy.
CONTRAST:  50mL OMNIPAQUE IOHEXOL 350 MG/ML SOLN

[Series 6: thins · axial · 0.70mm/px · z∈[-97,+158]mm · 15 of 285 slices shown]
[im 15/285  lung]
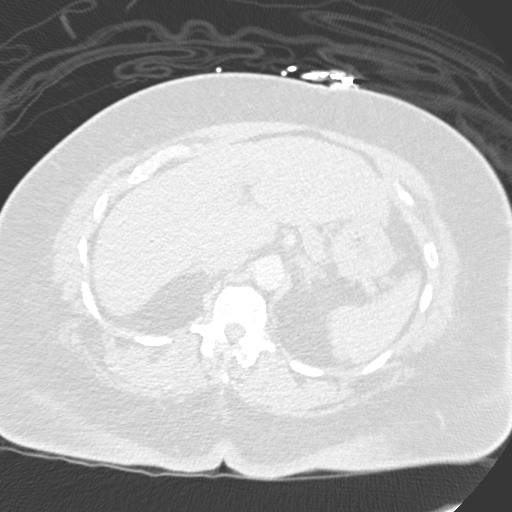
[im 30/285  soft-tissue]
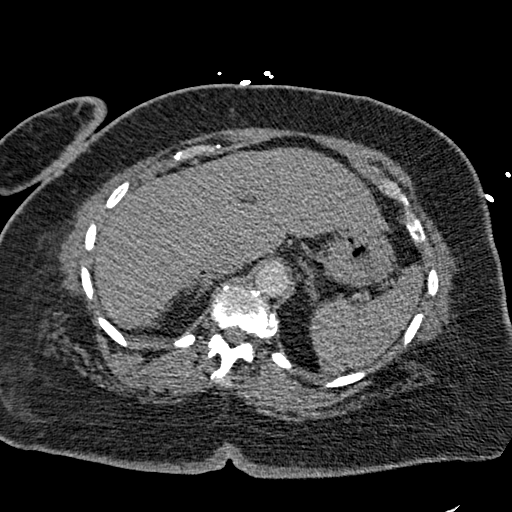
[im 60/285  lung]
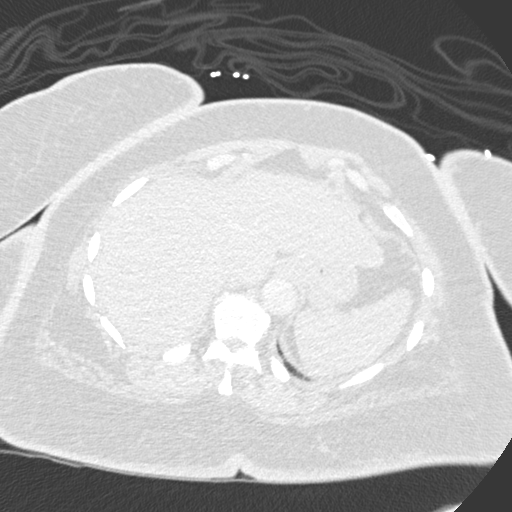
[im 75/285  soft-tissue]
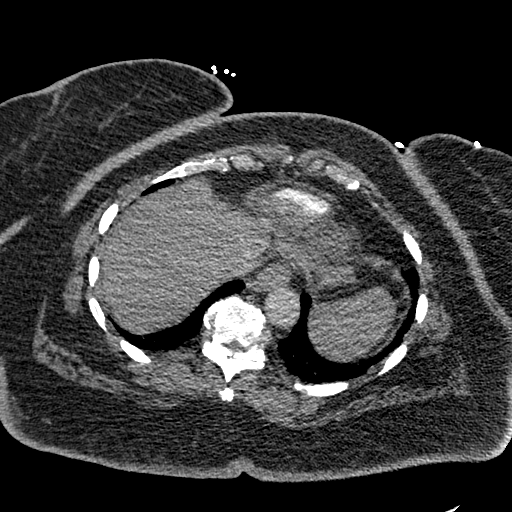
[im 90/285  lung]
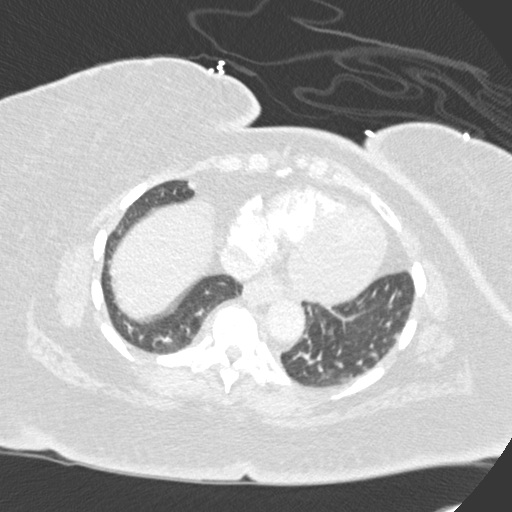
[im 105/285  soft-tissue]
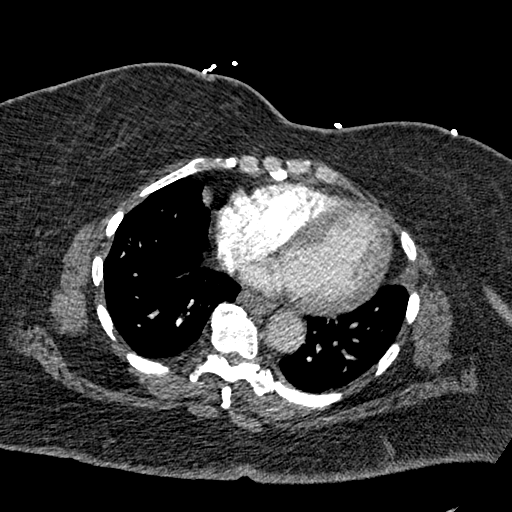
[im 120/285  lung]
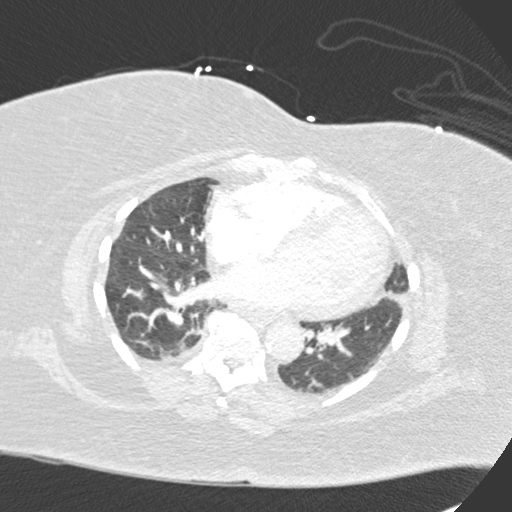
[im 150/285  soft-tissue]
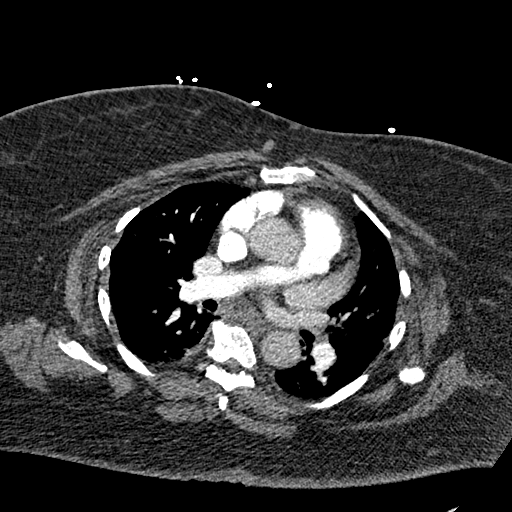
[im 165/285  lung]
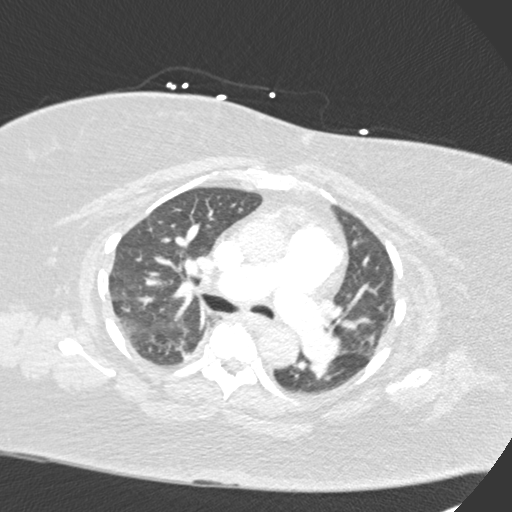
[im 180/285  soft-tissue]
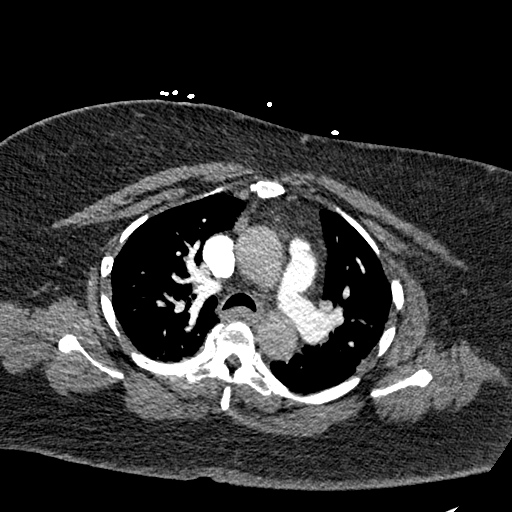
[im 195/285  lung]
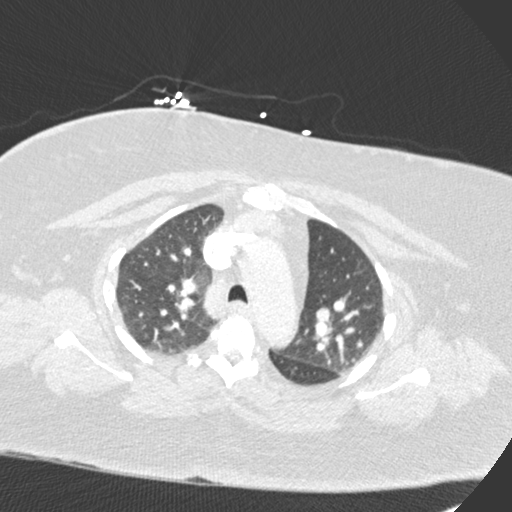
[im 210/285  soft-tissue]
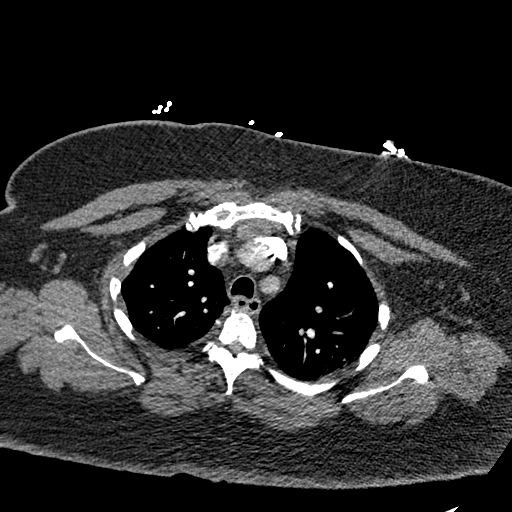
[im 240/285  lung]
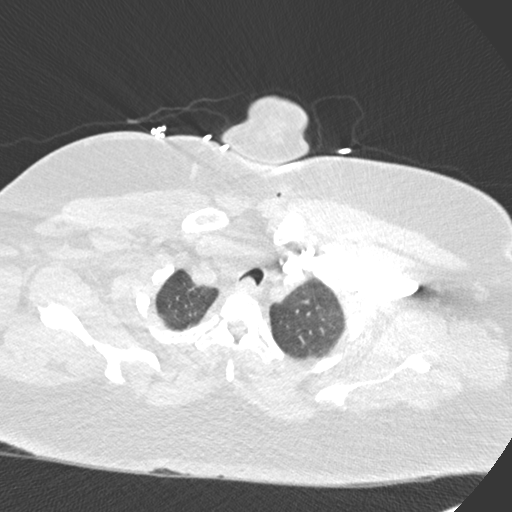
[im 255/285  soft-tissue]
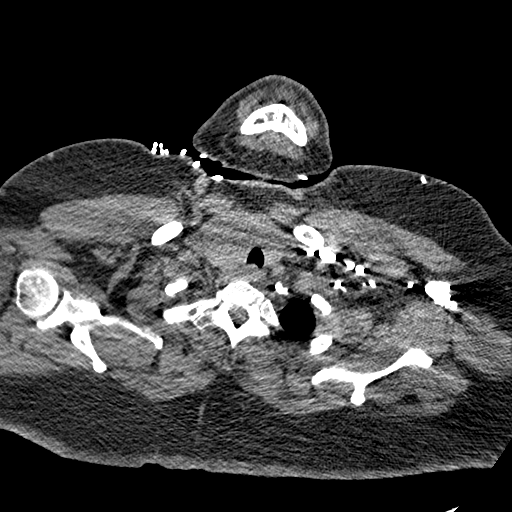
[im 270/285  lung]
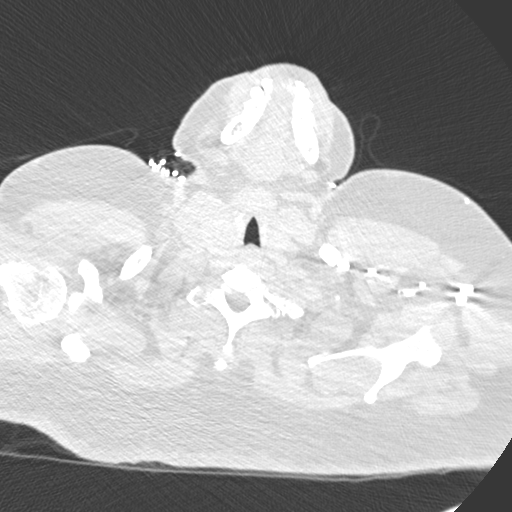

[Series 8: coronal mpr · coronal · 0.59mm/px · 3 of 151 slices shown]
[im 38/151  soft-tissue]
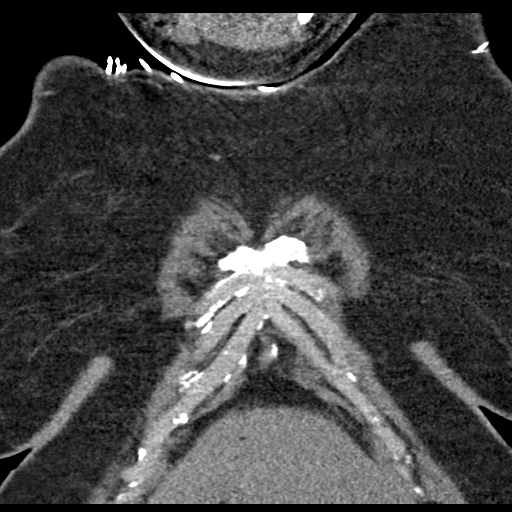
[im 76/151  soft-tissue]
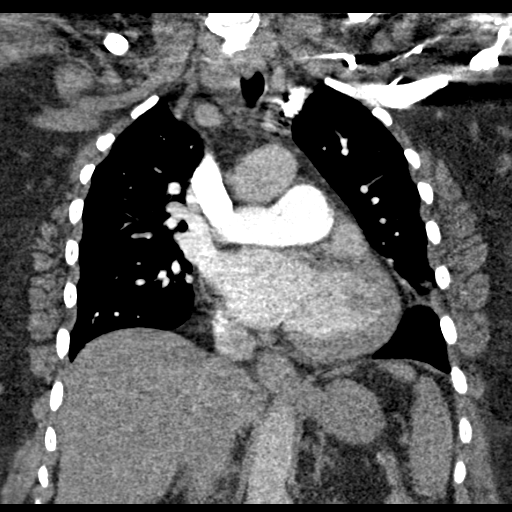
[im 113/151  soft-tissue]
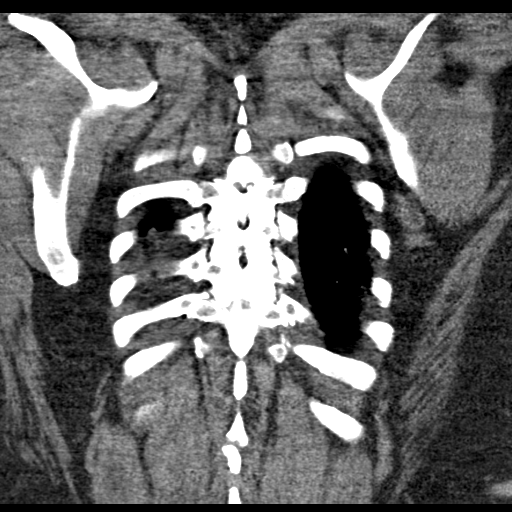

[18 of 46 positions shown; findings below may reference images not displayed]

FINDINGS: Cardiovascular: There is no appreciable pulmonary embolus. There is
no thoracic aortic aneurysm or dissection. Visualized great vessels
appear unremarkable. Note that the right innominate and left common
carotid arteries arise as a common trunk, an anatomic variant. There
is mild aortic atherosclerosis. There is no pericardial effusion or
pericardial thickening. Main pulmonary outflow tract measures
cm, prominent.

Mediastinum/Nodes: There is enlargement of the right lobe of the
thyroid with an apparent mass in the right lobe measuring 3.4 x
cm.

No appreciable thoracic adenopathy. No appreciable esophageal
lesion.

Lungs/Pleura: There are scattered foci of atelectatic change. No
edema or airspace opacity. No appreciable pleural effusions.

Upper Abdomen: Visualized upper abdominal structures appear normal.

Musculoskeletal: There are foci of degenerative change in the
thoracic spine. No blastic or lytic bone lesions. No evident chest
wall lesions.

Review of the MIP images confirms the above findings.
IMPRESSION: 1. No appreciable pulmonary embolus. No thoracic aortic aneurysm or
dissection. There are foci of aortic atherosclerosis.

2. Prominence of the main pulmonary outflow tract likely indicative
of a degree of pulmonary arterial hypertension.

3. Right-sided thyroid mass measuring 3.4 x 2.9 cm. Recommend
thyroid US per consensus guidelines. (Ref: [HOSPITAL]. [DATE]): 143-50).

4.  No evident thoracic adenopathy.

Aortic Atherosclerosis ([id number]-YG6.6).

## 2022-07-09 IMAGING — DX DG CHEST 2V
2 series · 2 of 2 positions shown · non-contrast
Comparison: Chest x-ray 05/19/2020, CT chest 05/19/2020

CLINICAL DATA: Chest pain, back pain, nausea, and shortness of
breath for past 4 days,

EXAM:
CHEST - 2 VIEW

[chest pa]
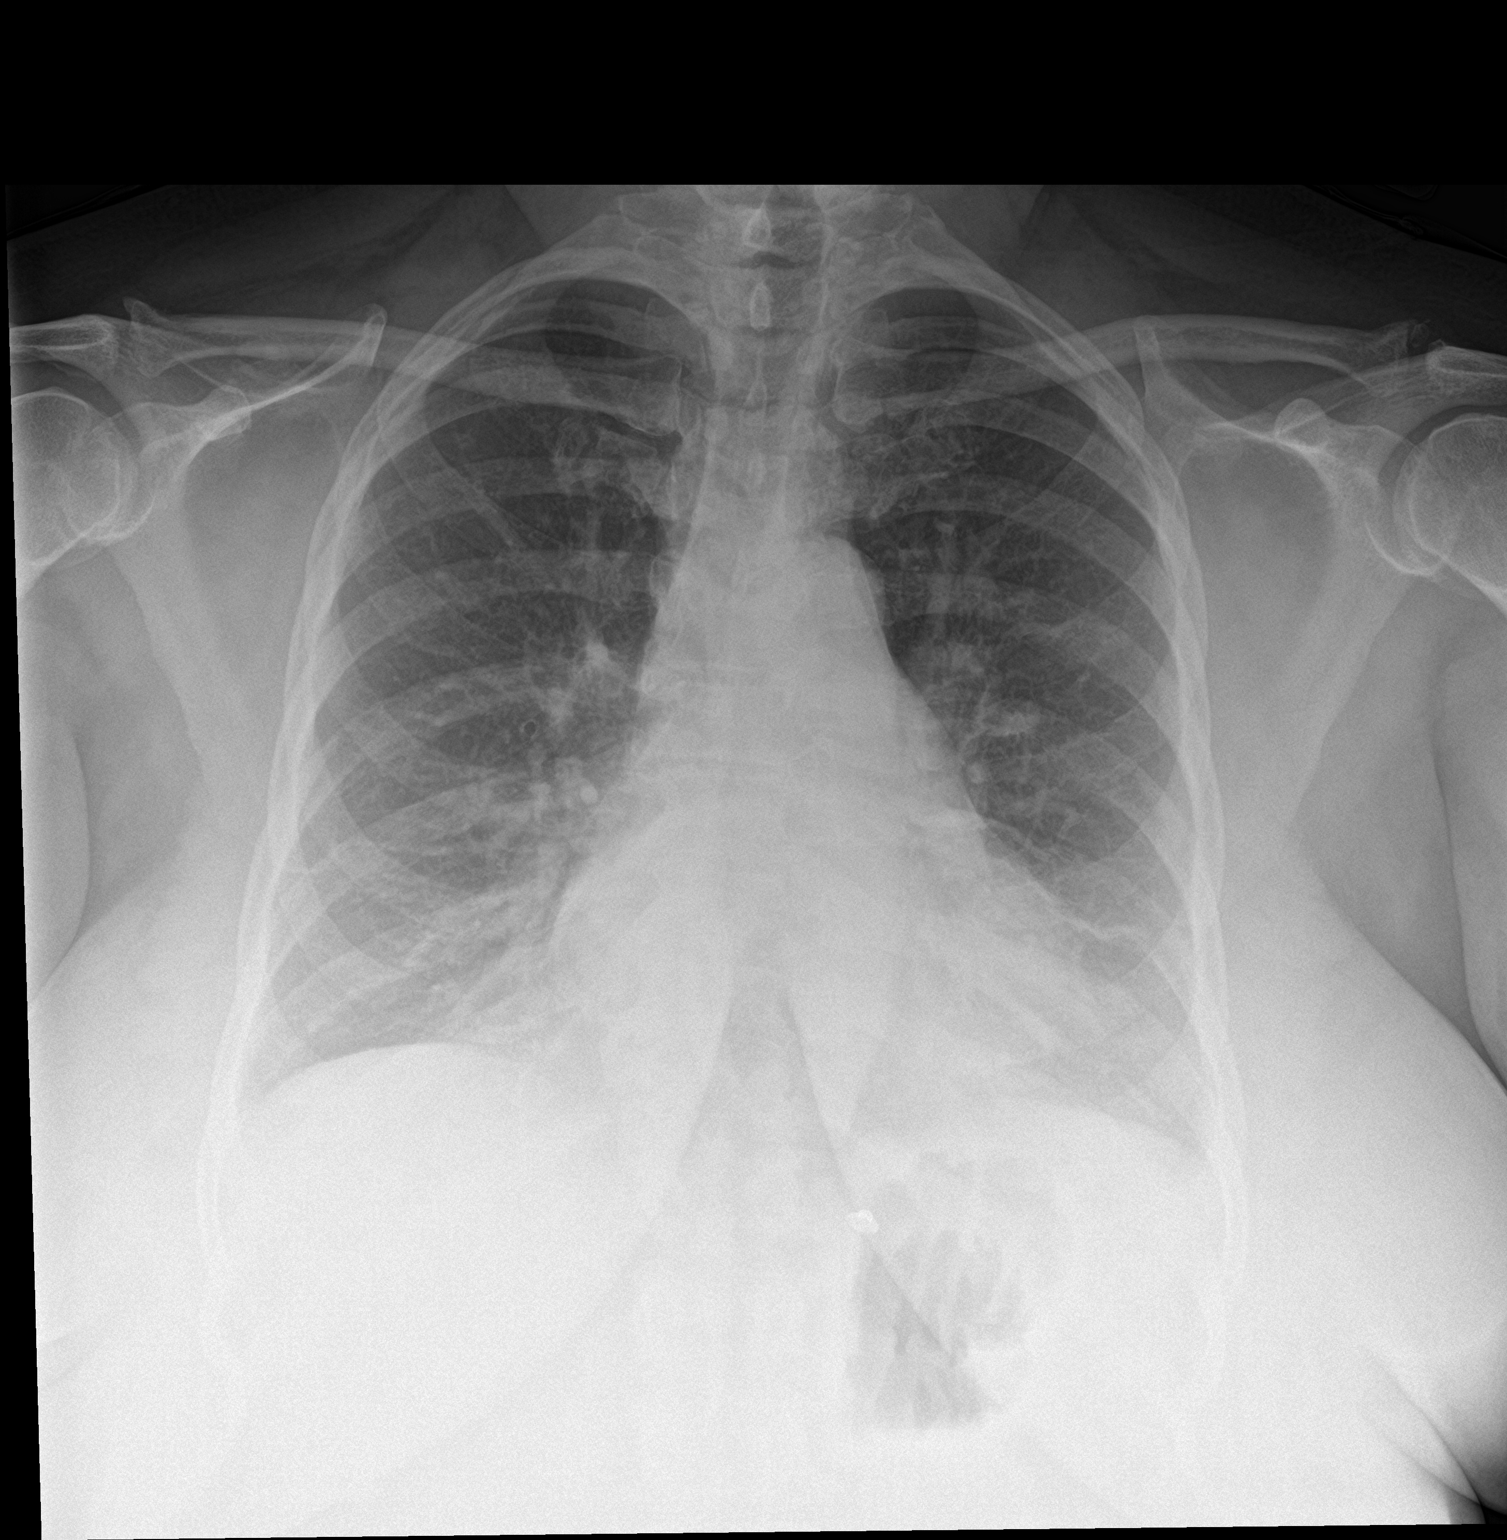

[chest lat]
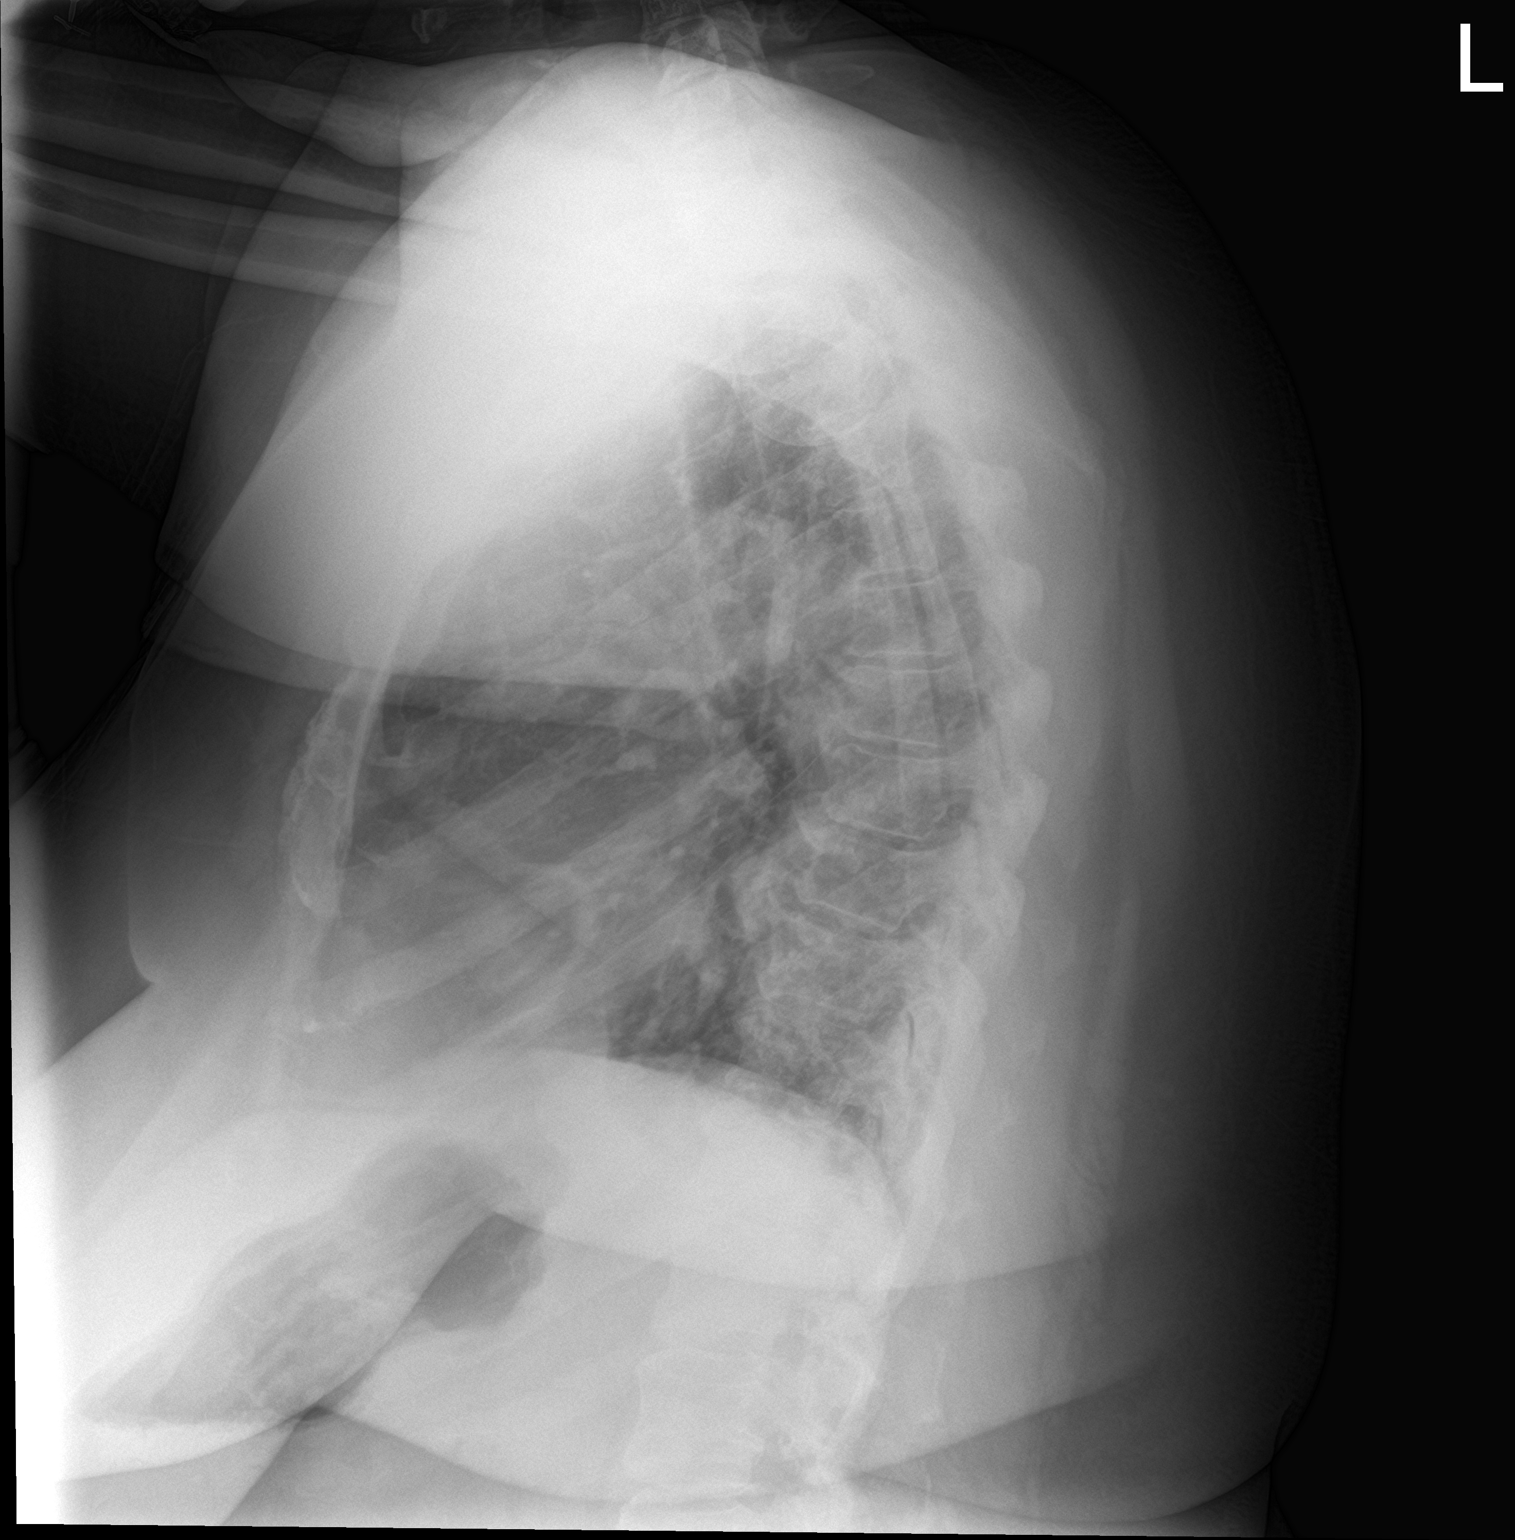

[2 of 2 positions shown; findings below may reference images not displayed]

FINDINGS: The heart size and mediastinal contours are within normal limits.

Linear atelectasis at the left base. No focal consolidation. No
pulmonary edema. No pleural effusion. No pneumothorax.

No acute osseous abnormality.
IMPRESSION: No active cardiopulmonary disease.

## 2022-07-10 ENCOUNTER — Inpatient Hospital Stay: Payer: 59 | Attending: Hematology and Oncology | Admitting: Hematology and Oncology

## 2022-07-10 VITALS — BP 142/64 | HR 78 | Temp 98.1°F | Resp 16 | Ht <= 58 in | Wt 211.1 lb

## 2022-07-10 DIAGNOSIS — Z8673 Personal history of transient ischemic attack (TIA), and cerebral infarction without residual deficits: Secondary | ICD-10-CM | POA: Diagnosis not present

## 2022-07-10 DIAGNOSIS — N6312 Unspecified lump in the right breast, upper inner quadrant: Secondary | ICD-10-CM | POA: Insufficient documentation

## 2022-07-10 DIAGNOSIS — Z8261 Family history of arthritis: Secondary | ICD-10-CM | POA: Insufficient documentation

## 2022-07-10 DIAGNOSIS — Z9049 Acquired absence of other specified parts of digestive tract: Secondary | ICD-10-CM | POA: Diagnosis not present

## 2022-07-10 DIAGNOSIS — Z823 Family history of stroke: Secondary | ICD-10-CM | POA: Insufficient documentation

## 2022-07-10 DIAGNOSIS — Z825 Family history of asthma and other chronic lower respiratory diseases: Secondary | ICD-10-CM | POA: Insufficient documentation

## 2022-07-10 DIAGNOSIS — Z923 Personal history of irradiation: Secondary | ICD-10-CM | POA: Diagnosis not present

## 2022-07-10 DIAGNOSIS — I10 Essential (primary) hypertension: Secondary | ICD-10-CM | POA: Insufficient documentation

## 2022-07-10 DIAGNOSIS — C50411 Malignant neoplasm of upper-outer quadrant of right female breast: Secondary | ICD-10-CM | POA: Insufficient documentation

## 2022-07-10 DIAGNOSIS — Z90710 Acquired absence of both cervix and uterus: Secondary | ICD-10-CM | POA: Diagnosis not present

## 2022-07-10 DIAGNOSIS — Z8349 Family history of other endocrine, nutritional and metabolic diseases: Secondary | ICD-10-CM | POA: Insufficient documentation

## 2022-07-10 DIAGNOSIS — Z885 Allergy status to narcotic agent status: Secondary | ICD-10-CM | POA: Diagnosis not present

## 2022-07-10 DIAGNOSIS — Z79899 Other long term (current) drug therapy: Secondary | ICD-10-CM | POA: Insufficient documentation

## 2022-07-10 DIAGNOSIS — F1290 Cannabis use, unspecified, uncomplicated: Secondary | ICD-10-CM | POA: Insufficient documentation

## 2022-07-10 DIAGNOSIS — Z86711 Personal history of pulmonary embolism: Secondary | ICD-10-CM | POA: Insufficient documentation

## 2022-07-10 DIAGNOSIS — Z5986 Financial insecurity: Secondary | ICD-10-CM | POA: Insufficient documentation

## 2022-07-10 DIAGNOSIS — G4730 Sleep apnea, unspecified: Secondary | ICD-10-CM | POA: Insufficient documentation

## 2022-07-10 DIAGNOSIS — Z882 Allergy status to sulfonamides status: Secondary | ICD-10-CM | POA: Insufficient documentation

## 2022-07-10 DIAGNOSIS — I2510 Atherosclerotic heart disease of native coronary artery without angina pectoris: Secondary | ICD-10-CM | POA: Insufficient documentation

## 2022-07-10 DIAGNOSIS — Z8249 Family history of ischemic heart disease and other diseases of the circulatory system: Secondary | ICD-10-CM | POA: Diagnosis not present

## 2022-07-10 DIAGNOSIS — Z880 Allergy status to penicillin: Secondary | ICD-10-CM | POA: Diagnosis not present

## 2022-07-10 DIAGNOSIS — Z803 Family history of malignant neoplasm of breast: Secondary | ICD-10-CM | POA: Insufficient documentation

## 2022-07-10 DIAGNOSIS — Z800 Family history of malignant neoplasm of digestive organs: Secondary | ICD-10-CM | POA: Diagnosis not present

## 2022-07-10 DIAGNOSIS — Z171 Estrogen receptor negative status [ER-]: Secondary | ICD-10-CM | POA: Diagnosis not present

## 2022-07-10 DIAGNOSIS — Z818 Family history of other mental and behavioral disorders: Secondary | ICD-10-CM | POA: Insufficient documentation

## 2022-07-10 DIAGNOSIS — Z811 Family history of alcohol abuse and dependence: Secondary | ICD-10-CM | POA: Insufficient documentation

## 2022-07-10 DIAGNOSIS — E119 Type 2 diabetes mellitus without complications: Secondary | ICD-10-CM | POA: Diagnosis not present

## 2022-07-10 DIAGNOSIS — Z833 Family history of diabetes mellitus: Secondary | ICD-10-CM | POA: Insufficient documentation

## 2022-07-10 NOTE — Progress Notes (Signed)
[place] NOTE  Patient Care Team: Sonia Side., FNP as PCP - General (Family Medicine) Josue Hector, MD as PCP - Cardiology (Cardiology) Mauro Kaufmann, RN as Oncology Nurse Navigator Rockwell Germany, RN as Oncology Nurse Navigator Donnie Mesa, MD as Consulting Physician (General Surgery) [name] Pike, MD as Consulting Physician (Hematology and Oncology) Kyung Rudd, MD as Consulting Physician (Radiation Oncology)  CHIEF COMPLAINTS/PURPOSE OF CONSULTATION:  Breast cancer follow-up   HISTORY OF PRESENTING ILLNESS:   [name] 62 y.o. female is here because of recent diagnosis of right breast IDC  I reviewed her records extensively and collaborated the history with the patient.  SUMMARY OF ONCOLOGIC HISTORY: Oncology History  Malignant neoplasm of upper-outer quadrant of female breast ([place])  10/07/2021 Mammogram   Right breast, possible mass warrants further evaluation, no findings suspicious for malignancy in the left breast.Targeted ultrasound is performed, showing a 8 x 6 x 7 mm irregular mass at the right breast 1 o'clock 13 cm from nipple correlating to the mammographic mass. Ultrasound of the right axilla is negative.   10/27/2021 Pathology Results   Per verbal report at breast [facility] today, grade 3 IDC, triple negative, Ki 67 25%   11/02/2021 Initial Diagnosis   Malignant neoplasm of upper-outer quadrant of female breast ([place])   11/21/2021 Genetic Testing   No pathogenic variants detected in [facility] CustomNext-Cancer +RNAinsight Panel.  Report date is November 21, 2021.   The CustomNext-Cancer+RNAinsight panel offered by [facility] includes sequencing and rearrangement analysis for the following 47 genes:  APC, ATM, AXIN2, BARD1, BMPR1A, BRCA1, BRCA2, BRIP1, CDH1, CDK4, CDKN2A, CHEK2, DICER1, EPCAM, GREM1, HOXB13, MEN1, MLH1, MSH2, MSH3, MSH6, MUTYH, NBN, NF1, NF2, NTHL1, PALB2, PMS2, POLD1, POLE, PTEN, RAD51C, RAD51D, RECQL, RET,  SDHA, SDHAF2, SDHB, SDHC, SDHD, SMAD4, SMARCA4, STK11, TP53, TSC1, TSC2, and VHL.  RNA data is routinely analyzed for use in variant interpretation for all genes.   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  Prior prognostic showed ER 0% negative PR 0% negative, HER2 low with IHC of 1+ and Ki-67 of 25%.   12/30/2021 -  Chemotherapy   Patient is on Treatment Plan : BREAST TC q21d      Interval History  She is here for a follow up after radiation.   She completed adjuvant radiation on 05/02/2022. Since her last visit here, she has felt increased pain and swelling of the right breast, right axillary tenderness.  She is now working with physical therapy again.  She is however very worried about the swelling.  She denies any other complaints today except for these issues.  She has recovered very well from chemotherapy related side effects.  No change in breathing, bowel habits or urinary habits. Rest of the pertinent 10 point ROS reviewed and negative  MEDICAL HISTORY:  Past Medical History:  Diagnosis Date   Allergy    Anginal pain ([place])    a. NL cath in 2008;  b. Myoview 03/2011: dec uptake along mid anterior wall on stress imaging -> ? attenuation vs. ischemia, EF 65%;  c. 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 2008 -> coumadin x 6 mos.   Rheumatoid arthritis ([place])    Sleep apnea    on CPAP 02/2018   SOB (shortness of breath)    Thyroid disease    TIA (transient ischemic attack)    Urinary urgency    Vitamin D deficiency     SURGICAL HISTORY: Past Surgical History:  Procedure Laterality Date   ABDOMINAL HYSTERECTOMY  2005   APPENDECTOMY     BREAST LUMPECTOMY WITH RADIOACTIVE SEED AND SENTINEL LYMPH NODE BIOPSY Right 11/16/2021   Procedure: RIGHT BREAST RADIOACTIVE SEED LOCALIZED LUMPECTOMY WITH RIGHT AXILLARY SENTINEL LYMPH NODE BIOPSY;  Surgeon: Donnie Mesa, MD;  Location: [facility];  Service: General;  Laterality: Right;  LMA & PEC BLOCK   CARDIAC CATHETERIZATION     Normal   CARDIAC CATHETERIZATION N/A 09/30/2014   Procedure: Left Heart Cath and Coronary Angiography;  Surgeon: Sherren Mocha, MD;  Location: [facility];  Service: Cardiovascular;  Laterality: N/A;   LAPAROSCOPIC APPENDECTOMY N/A 06/03/2012   Procedure: APPENDECTOMY LAPAROSCOPIC;  Surgeon: Stark Klein, MD;  Location: [facility];  Service: General;  Laterality: N/A;   Left knee surgery  2008   LEG SURGERY     TONSILLECTOMY     TOTAL HIP ARTHROPLASTY Left 06/11/2018   Procedure: LEFT TOTAL HIP ARTHROPLASTY ANTERIOR APPROACH;  Surgeon: Meredith Pel, MD;  Location: [facility];  Service: Orthopedics;  Laterality: Left;   TOTAL KNEE ARTHROPLASTY Left 08/22/2016   Procedure: TOTAL KNEE ARTHROPLASTY;  Surgeon: Meredith Pel, MD;  Location: [facility];  Service: Orthopedics;  Laterality: Left;   TUBAL LIGATION  1989    SOCIAL HISTORY: Social History   Socioeconomic History   Marital status: Divorced    Spouse name: Not on file   Number of children: 3   Years of education: Not on file   Highest education level: Not on file  Occupational History   Occupation:  UNEMPLOYED    Employer: DISABLED  Tobacco Use   Smoking status: Former    Packs/day: 0.50    Years: 15.00    Additional pack years: 0.00    Total pack years: 7.50    Types: Cigarettes    Quit date: 04/24/1985    Years since quitting: 37.2   Smokeless tobacco: Never  Vaping Use   Vaping Use: Never used  Substance and Sexual Activity   Alcohol use: No    Alcohol/week: 0.0 standard drinks of alcohol   Drug use: Not Currently    Types: Marijuana   Sexual activity: Not Currently    Birth control/protection: Surgical    Comment: hyst  Other Topics Concern   Not on file  Social History  Narrative   Lives in [place] by herself.  Disabled.  Occasional Tea .  3 kids, 6 grandkids.     Water aerobics 3 times a week.   Social Determinants of Health   Financial Resource Strain: High Risk (03/27/2022)   Overall Financial Resource Strain (CARDIA)    Difficulty of Paying Living Expenses: Very hard  Food Insecurity: Food Insecurity Present (03/27/2022)   Hunger Vital Sign    Worried About Running Out of Food in the Last Year: Sometimes true    Ran Out of Food in the Last Year: Sometimes true  Transportation Needs: No Transportation Needs (03/27/2022)   PRAPARE - Hydrologist (Medical): No    Lack of Transportation (Non-Medical): No  Physical Activity: Not on file  Stress: Not on file  Social Connections: Not on file  Intimate Partner Violence: Not At Risk (03/28/2022)   Humiliation, Afraid, Rape, and Kick questionnaire    Fear of Current or Ex-Partner: No    Emotionally Abused: No    Physically Abused: No    Sexually Abused: No    FAMILY HISTORY: Family History  Problem Relation Age of Onset   Emphysema Mother    Arthritis Mother    Heart failure Mother        alive @ 57   Stroke Mother    Diabetes Mother    Hypertension Mother    Hyperlipidemia Mother    Depression Mother    Anxiety disorder Mother    Heart disease Mother    Alcoholism Mother     Depression Father    Heart disease Father        died @ 26's.   Stroke Father    Diabetes Father    Hyperlipidemia Father    Hypertension Father    Bipolar disorder Father    Sleep apnea Father    Alcoholism Father    Drug abuse Father    Sudden death Father    Diabetes Sister    Asthma Brother    Breast cancer Maternal Aunt 30 - 13   Colon cancer Maternal Grandfather        dx after 68   Heart disease Paternal Grandfather    Breast cancer Cousin        dx 60s   Esophageal cancer Neg Hx    Stomach cancer Neg Hx    Rectal cancer Neg Hx     ALLERGIES:  is allergic to hydrocodone-acetaminophen, penicillins, shellfish allergy, latex, and sulfa antibiotics.  MEDICATIONS:  Current Outpatient Medications  Medication Sig Dispense Refill   ACCU-CHEK GUIDE test strip      acetaminophen (TYLENOL) 500 MG tablet Take 1,000 mg by mouth 2 (two) times daily as needed for moderate pain.     albuterol (PROVENTIL HFA;VENTOLIN HFA) 108 (90 Base) MCG/ACT inhaler Inhale 2 puffs into the lungs every 6 (six) hours as needed for wheezing or shortness of breath.     aspirin 81 MG chewable tablet Chew 81 mg by mouth daily.     atorvastatin (LIPITOR) 40 MG tablet Take 40 mg by mouth at bedtime.      busPIRone (BUSPAR) 30 MG tablet Take 30 mg by mouth 2 (two) times daily.     carvedilol (COREG) 6.25 MG tablet Take 1 tablet (6.25 mg total) by mouth 2 (two) times daily. 180 tablet 3   diclofenac Sodium (VOLTAREN) 1 % GEL Apply 2 g topically 4 (four) times daily as needed (joint/muscle pain).  doxycycline (VIBRA-TABS) 100 MG tablet Take 1 tablet (100 mg total) by mouth 2 (two) times daily. 20 tablet 0   empagliflozin (JARDIANCE) 10 MG TABS tablet Take 1 tablet (10 mg total) by mouth daily. 30 tablet 0   EPINEPHrine 0.3 mg/0.3 mL IJ SOAJ injection Inject 0.3 mg into the muscle as needed for anaphylaxis. Follow package instructions as needed for severe allergy or anaphylactic reaction. 2 each 3    furosemide (LASIX) 40 MG tablet Take 1 tablet (40 mg total) by mouth daily as needed for edema or fluid (take in case of weight gain 2 to 3 lbs in 24 hrs or 5 lbs in 7 days.). 30 tablet 0   hydrOXYzine (VISTARIL) 25 MG capsule Take 25 mg by mouth 3 (three) times daily as needed for itching.     lidocaine (LIDODERM) 5 % Place 1 patch onto the skin daily as needed for pain.     lidocaine-prilocaine (EMLA) cream Apply to affected area once (Patient taking differently: Apply 1 Application topically once. Apply to affected area once) 30 g 3   loperamide (IMODIUM) 2 MG capsule Take 1 capsule (2 mg total) by mouth 4 (four) times daily as needed for diarrhea or loose stools. 12 capsule 0   losartan (COZAAR) 25 MG tablet Take 1 tablet (25 mg total) by mouth daily. 30 tablet 0   metFORMIN (GLUCOPHAGE) 500 MG tablet Take 1 tablet (500 mg total) by mouth daily with breakfast. 90 tablet 0   nitroGLYCERIN (NITROSTAT) 0.4 MG SL tablet Place 1 tablet (0.4 mg total) under the tongue every 5 (five) minutes as needed for chest pain. 25 tablet 3   nystatin (MYCOSTATIN/NYSTOP) powder Apply 1 application topically 3 (three) times daily as needed for rash.     ondansetron (ZOFRAN-ODT) 4 MG disintegrating tablet Take 1 tablet (4 mg total) by mouth every 8 (eight) hours as needed for nausea or vomiting. 20 tablet 0   oxyCODONE (OXY IR/ROXICODONE) 5 MG immediate release tablet Take 1 tablet (5 mg total) by mouth every 6 (six) hours as needed for severe pain. 30 tablet 0   pantoprazole (PROTONIX) 40 MG tablet Take 1 tablet (40 mg total) by mouth 2 (two) times daily. (Patient taking differently: Take 40 mg by mouth daily.) 60 tablet 0   prochlorperazine (COMPAZINE) 10 MG tablet Take 1 tablet (10 mg total) by mouth every 6 (six) hours as needed for nausea or vomiting. 30 tablet 1   sertraline (ZOLOFT) 25 MG tablet Take 1 tablet (25 mg total) by mouth daily. 90 tablet 1   spironolactone (ALDACTONE) 25 MG tablet Take 1/2 tablet  (12.5 mg total) by mouth daily. 15 tablet 0   Vitamin D, Ergocalciferol, (DRISDOL) 1.25 MG (50000 UNIT) CAPS capsule Take 1 capsule (50,000 Units total) by mouth every 7 (seven) days. 4 capsule 0   No current facility-administered medications for this visit.    PHYSICAL EXAMINATION: ECOG PERFORMANCE STATUS: 1 - Symptomatic but completely ambulatory  Vitals:   07/10/22 1002  BP: (!) 142/64  Pulse: 78  Resp: 16  Temp: 98.1 F (36.7 C)  SpO2: 100%      Filed Weights   07/10/22 1002  Weight: 211 lb 1.6 oz (95.8 kg)     Physical Exam Constitutional:      Appearance: Normal appearance.  Cardiovascular:     Rate and Rhythm: Normal rate and regular rhythm.  Pulmonary:     Effort: Pulmonary effort is normal.     Breath sounds: Normal  breath sounds.  Chest:     Comments: Right breast with some postradiation changes and some evidence of lymphedema.  There appears to be some axillary tenderness with possibly cording.  No palpable masses concerning for cancer.  Abdominal:     General: Abdomen is flat.     Palpations: Abdomen is soft.  Musculoskeletal:        General: Swelling: Bilateral symmetrical lower extremity swelling improved/resolved.     Cervical back: Normal range of motion and neck supple. No rigidity.  Lymphadenopathy:     Cervical: No cervical adenopathy.  Skin:    General: Skin is warm and dry.  Neurological:     General: No focal deficit present.     Mental Status: She is alert.      LABORATORY DATA:  I have reviewed the data as listed Lab Results  Component Value Date   WBC 4.3 03/24/2022   HGB 9.1 (L) 03/24/2022   HCT 29.5 (L) 03/24/2022   MCV 96.4 03/24/2022   PLT 184 03/24/2022   Lab Results  Component Value Date   NA 142 04/18/2022   K 4.1 04/18/2022   CL 107 (H) 04/18/2022   CO2 20 04/18/2022    RADIOGRAPHIC STUDIES: I have personally reviewed the radiological reports and agreed with the findings in the report.  ASSESSMENT AND PLAN:    Malignant neoplasm of upper-outer quadrant of female breast [facility]) This is a very pleasant 62 year old female patient with past medical history significant for congestive heart failure, coronary artery disease, diabetes, hypertension, dyslipidemia history of pulmonary embolism back in 2008 for which she needed anticoagulation, rheumatoid arthritis, obstructive sleep apnea who is newly diagnosed with right breast IDC noticed on screening mammogram.    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  She is now s/p 4 cycles of TC and adjuvant radiation. No role for anti estrogen therapy since she had triple neg breast cancer. Since her last visit here, she has been experiencing increased right breast swelling as well as some pain in the axilla hence this visit.  No concerning changes for recurrence.  I do believe she most likely has lymphedema and some postradiation changes.  We have however discussed about doing an ultrasound of the right breast and axilla and she seems to be agreeable.  I can call her in a couple weeks to discuss ultrasound results.  She otherwise will continue physical therapy and return to clinic for follow-up in about 6 months.   Total time spent: 30 minutes including history, physical exam, review of records, counseling and coordination of care All questions were answered. The patient knows to call the clinic with any problems, questions or concerns.    [name] Pike, MD 07/10/22

## 2022-07-10 NOTE — Assessment & Plan Note (Addendum)
This is a very pleasant 62 year old female patient with past medical history significant for congestive heart failure, coronary artery disease, diabetes, hypertension, dyslipidemia history of pulmonary embolism back in 2008 for which she needed anticoagulation, rheumatoid arthritis, obstructive sleep apnea who is newly diagnosed with right breast IDC noticed on screening mammogram.    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  She is now s/p 4 cycles of TC and adjuvant radiation. No role for anti estrogen therapy since she had triple neg breast cancer. Since her last visit here, she has been experiencing increased right breast swelling as well as some pain in the axilla hence this visit.  No concerning changes for recurrence.  I do believe she most likely has lymphedema and some postradiation changes.  We have however discussed about doing an ultrasound of the right breast and axilla and she seems to be agreeable.  I can call her in a couple weeks to discuss ultrasound results.  She otherwise will continue physical therapy and return to clinic for follow-up in about 6 months.

## 2022-07-11 ENCOUNTER — Telehealth: Payer: Self-pay | Admitting: Hematology and Oncology

## 2022-07-11 NOTE — Telephone Encounter (Signed)
Spoke with patient confirming upcoming appointments  

## 2022-07-14 ENCOUNTER — Encounter (HOSPITAL_COMMUNITY): Payer: Self-pay

## 2022-07-14 ENCOUNTER — Emergency Department (HOSPITAL_COMMUNITY): Payer: 59

## 2022-07-14 ENCOUNTER — Observation Stay (HOSPITAL_COMMUNITY)
Admission: EM | Admit: 2022-07-14 | Discharge: 2022-07-16 | Disposition: A | Discharge status: Home / Self Care | Payer: 59 | Attending: Emergency Medicine | Admitting: Emergency Medicine

## 2022-07-14 DIAGNOSIS — R918 Other nonspecific abnormal finding of lung field: Secondary | ICD-10-CM

## 2022-07-14 DIAGNOSIS — R911 Solitary pulmonary nodule: Secondary | ICD-10-CM | POA: Insufficient documentation

## 2022-07-14 DIAGNOSIS — Z8673 Personal history of transient ischemic attack (TIA), and cerebral infarction without residual deficits: Secondary | ICD-10-CM | POA: Diagnosis not present

## 2022-07-14 DIAGNOSIS — Z7984 Long term (current) use of oral hypoglycemic drugs: Secondary | ICD-10-CM | POA: Diagnosis not present

## 2022-07-14 DIAGNOSIS — Z86711 Personal history of pulmonary embolism: Secondary | ICD-10-CM | POA: Insufficient documentation

## 2022-07-14 DIAGNOSIS — Z91040 Latex allergy status: Secondary | ICD-10-CM | POA: Insufficient documentation

## 2022-07-14 DIAGNOSIS — Z96652 Presence of left artificial knee joint: Secondary | ICD-10-CM | POA: Insufficient documentation

## 2022-07-14 DIAGNOSIS — E1165 Type 2 diabetes mellitus with hyperglycemia: Secondary | ICD-10-CM | POA: Diagnosis present

## 2022-07-14 DIAGNOSIS — R0789 Other chest pain: Principal | ICD-10-CM | POA: Insufficient documentation

## 2022-07-14 DIAGNOSIS — I110 Hypertensive heart disease with heart failure: Secondary | ICD-10-CM | POA: Insufficient documentation

## 2022-07-14 DIAGNOSIS — Z79899 Other long term (current) drug therapy: Secondary | ICD-10-CM | POA: Diagnosis not present

## 2022-07-14 DIAGNOSIS — I509 Heart failure, unspecified: Secondary | ICD-10-CM | POA: Insufficient documentation

## 2022-07-14 DIAGNOSIS — F419 Anxiety disorder, unspecified: Secondary | ICD-10-CM | POA: Diagnosis present

## 2022-07-14 DIAGNOSIS — I10 Essential (primary) hypertension: Secondary | ICD-10-CM | POA: Diagnosis present

## 2022-07-14 DIAGNOSIS — Z87891 Personal history of nicotine dependence: Secondary | ICD-10-CM | POA: Insufficient documentation

## 2022-07-14 DIAGNOSIS — G4733 Obstructive sleep apnea (adult) (pediatric): Secondary | ICD-10-CM

## 2022-07-14 DIAGNOSIS — Z85830 Personal history of malignant neoplasm of bone: Secondary | ICD-10-CM | POA: Insufficient documentation

## 2022-07-14 DIAGNOSIS — Z7982 Long term (current) use of aspirin: Secondary | ICD-10-CM | POA: Diagnosis not present

## 2022-07-14 DIAGNOSIS — J45909 Unspecified asthma, uncomplicated: Secondary | ICD-10-CM | POA: Diagnosis not present

## 2022-07-14 DIAGNOSIS — Z1152 Encounter for screening for COVID-19: Secondary | ICD-10-CM | POA: Insufficient documentation

## 2022-07-14 DIAGNOSIS — R079 Chest pain, unspecified: Secondary | ICD-10-CM | POA: Diagnosis present

## 2022-07-14 DIAGNOSIS — C50419 Malignant neoplasm of upper-outer quadrant of unspecified female breast: Secondary | ICD-10-CM | POA: Diagnosis present

## 2022-07-14 DIAGNOSIS — Z96642 Presence of left artificial hip joint: Secondary | ICD-10-CM | POA: Diagnosis not present

## 2022-07-14 DIAGNOSIS — F418 Other specified anxiety disorders: Secondary | ICD-10-CM | POA: Diagnosis present

## 2022-07-14 DIAGNOSIS — I2510 Atherosclerotic heart disease of native coronary artery without angina pectoris: Secondary | ICD-10-CM | POA: Diagnosis not present

## 2022-07-14 DIAGNOSIS — E1169 Type 2 diabetes mellitus with other specified complication: Secondary | ICD-10-CM | POA: Diagnosis present

## 2022-07-14 DIAGNOSIS — J9859 Other diseases of mediastinum, not elsewhere classified: Secondary | ICD-10-CM

## 2022-07-14 DIAGNOSIS — Z853 Personal history of malignant neoplasm of breast: Secondary | ICD-10-CM | POA: Insufficient documentation

## 2022-07-14 DIAGNOSIS — E785 Hyperlipidemia, unspecified: Secondary | ICD-10-CM | POA: Diagnosis present

## 2022-07-14 LAB — CBC WITH DIFFERENTIAL/PLATELET
Abs Immature Granulocytes: 0.01 10*3/uL (ref 0.00–0.07)
Basophils Absolute: 0 10*3/uL (ref 0.0–0.1)
Basophils Relative: 1 %
Eosinophils Absolute: 0.2 10*3/uL (ref 0.0–0.5)
Eosinophils Relative: 4 %
HCT: 41.9 % (ref 36.0–46.0)
Hemoglobin: 13 g/dL (ref 12.0–15.0)
Immature Granulocytes: 0 %
Lymphocytes Relative: 49 %
Lymphs Abs: 1.9 10*3/uL (ref 0.7–4.0)
MCH: 27.3 pg (ref 26.0–34.0)
MCHC: 31 g/dL (ref 30.0–36.0)
MCV: 87.8 fL (ref 80.0–100.0)
Monocytes Absolute: 0.4 10*3/uL (ref 0.1–1.0)
Monocytes Relative: 10 %
Neutro Abs: 1.4 10*3/uL — ABNORMAL LOW (ref 1.7–7.7)
Neutrophils Relative %: 36 %
Platelets: 169 10*3/uL (ref 150–400)
RBC: 4.77 MIL/uL (ref 3.87–5.11)
RDW: 13.3 % (ref 11.5–15.5)
WBC: 3.8 10*3/uL — ABNORMAL LOW (ref 4.0–10.5)
nRBC: 0 % (ref 0.0–0.2)

## 2022-07-14 LAB — COMPREHENSIVE METABOLIC PANEL
ALT: 27 U/L (ref 0–44)
AST: 21 U/L (ref 15–41)
Albumin: 3.5 g/dL (ref 3.5–5.0)
Alkaline Phosphatase: 61 U/L (ref 38–126)
Anion gap: 13 (ref 5–15)
BUN: 11 mg/dL (ref 8–23)
CO2: 24 mmol/L (ref 22–32)
Calcium: 9.2 mg/dL (ref 8.9–10.3)
Chloride: 106 mmol/L (ref 98–111)
Creatinine, Ser: 1.05 mg/dL — ABNORMAL HIGH (ref 0.44–1.00)
GFR, Estimated: >60 mL/min (ref >60)
Glucose, Bld: 121 mg/dL — ABNORMAL HIGH (ref 70–99)
Potassium: 3.5 mmol/L (ref 3.5–5.1)
Sodium: 143 mmol/L (ref 135–145)
Total Bilirubin: 0.6 mg/dL (ref 0.3–1.2)
Total Protein: 6.4 g/dL — ABNORMAL LOW (ref 6.5–8.1)

## 2022-07-14 LAB — TROPONIN I (HIGH SENSITIVITY): Troponin I (High Sensitivity): 8 ng/L (ref <18)

## 2022-07-14 MED ORDER — LIDOCAINE 5 % EX PTCH
1.0000 | MEDICATED_PATCH | CUTANEOUS | Status: DC
  Administered 2022-07-15: 1 via TRANSDERMAL
  Filled 2022-07-14: qty 1

## 2022-07-14 MED ORDER — OXYCODONE HCL 5 MG PO TABS
5.0000 mg | ORAL_TABLET | Freq: Once | ORAL | Status: AC
  Administered 2022-07-15: 5 mg via ORAL
  Filled 2022-07-14: qty 1

## 2022-07-14 NOTE — ED Triage Notes (Addendum)
PT BIB EMS from home with c/o chest pain that started around 2000 tonight. Pain is dull pressure, non radiating in center of chest. Pt took 324mg  asa and 0.4mg  nitro at home prior to EMS arrival. Pt given additional 0.4 mg nitro by EMS. GCS 15. Hx of R lumpectomy last year.

## 2022-07-14 NOTE — ED Provider Notes (Signed)
[facility] Provider Note   CSN: [id number] Arrival date & time: 07/14/22  2231     History  Chief Complaint  Patient presents with   Chest Pain    KIDADA JENNIGES is a 62 y.o. female.with pmh HTN, HLD, CAD, CHF, PE no longer on anticoagulation, breast cancer s/p XRT presenting with chest pain.  Patient said she woke up feeling generally unwell.  She has felt fatigued and had less energy than usual.  She was sitting down watching TV around 8 PM last night and began to feel some dull pressure pain nonradiating in the substernal region.  It is worse with palpation.  Has been constant since 8.  She was given nitroglycerin and aspirin from EMS with no improvement.  She does have lymphedema on the right side from her breast cancer.  She is no longer on cancer therapy.  She is currently in remission.  She has had no fevers, no cough, no shortness of breath, no leg pain or swelling.  Her weight has been up and down 1 pound.  No significant weight change.   Chest Pain      Home Medications Prior to Admission medications   Medication Sig Start Date End Date Taking? Authorizing Provider  ACCU-CHEK GUIDE test strip  10/28/21   [provider]  acetaminophen (TYLENOL) 500 MG tablet Take 1,000 mg by mouth 2 (two) times daily as needed for moderate pain.    [provider]  albuterol (PROVENTIL HFA;VENTOLIN HFA) 108 (90 Base) MCG/ACT inhaler Inhale 2 puffs into the lungs every 6 (six) hours as needed for wheezing or shortness of breath.    [provider]  aspirin 81 MG chewable tablet Chew 81 mg by mouth daily.    [provider]  atorvastatin (LIPITOR) 40 MG tablet Take 40 mg by mouth at bedtime.     [provider]  busPIRone (BUSPAR) 30 MG tablet Take 30 mg by mouth 2 (two) times daily. 12/18/19   [provider]  carvedilol (COREG) 6.25 MG tablet Take 1 tablet (6.25 mg total) by mouth 2 (two)  times daily. 09/06/21   Josue Hector, MD  diclofenac Sodium (VOLTAREN) 1 % GEL Apply 2 g topically 4 (four) times daily as needed (joint/muscle pain). 09/20/19   [provider]  doxycycline (VIBRA-TABS) 100 MG tablet Take 1 tablet (100 mg total) by mouth 2 (two) times daily. 05/15/22   Hayden Pedro, PA-C  empagliflozin (JARDIANCE) 10 MG TABS tablet Take 1 tablet (10 mg total) by mouth daily. 03/28/22   Arrien, Jimmy Picket, MD  EPINEPHrine 0.3 mg/0.3 mL IJ SOAJ injection Inject 0.3 mg into the muscle as needed for anaphylaxis. Follow package instructions as needed for severe allergy or anaphylactic reaction. 11/06/21   Carrie Mew, MD  furosemide (LASIX) 40 MG tablet Take 1 tablet (40 mg total) by mouth daily as needed for edema or fluid (take in case of weight gain 2 to 3 lbs in 24 hrs or 5 lbs in 7 days.). 03/28/22 04/27/22  Arrien, Jimmy Picket, MD  hydrOXYzine (VISTARIL) 25 MG capsule Take 25 mg by mouth 3 (three) times daily as needed for itching.    [provider]  lidocaine (LIDODERM) 5 % Place 1 patch onto the skin daily as needed for pain. 02/11/21   [provider]  lidocaine-prilocaine (EMLA) cream Apply to affected area once Patient taking differently: Apply 1 Application topically once. Apply to affected area  once 12/20/21   [name] Pike, MD  loperamide (IMODIUM) 2 MG capsule Take 1 capsule (2 mg total) by mouth 4 (four) times daily as needed for diarrhea or loose stools. 01/07/22   Long, Wonda Olds, MD  losartan (COZAAR) 25 MG tablet Take 1 tablet (25 mg total) by mouth daily. 03/28/22 04/27/22  Arrien, Jimmy Picket, MD  metFORMIN (GLUCOPHAGE) 500 MG tablet Take 1 tablet (500 mg total) by mouth daily with breakfast. 12/19/21   Laqueta Linden, MD  nitroGLYCERIN (NITROSTAT) 0.4 MG SL tablet Place 1 tablet (0.4 mg total) under the tongue every 5 (five) minutes as needed for chest pain. 04/11/22   Elgie Collard, PA-C  nystatin  (MYCOSTATIN/NYSTOP) powder Apply 1 application topically 3 (three) times daily as needed for rash.    [provider]  ondansetron (ZOFRAN-ODT) 4 MG disintegrating tablet Take 1 tablet (4 mg total) by mouth every 8 (eight) hours as needed for nausea or vomiting. 01/07/22   Long, Wonda Olds, MD  oxyCODONE (OXY IR/ROXICODONE) 5 MG immediate release tablet Take 1 tablet (5 mg total) by mouth every 6 (six) hours as needed for severe pain. 04/07/22   Kyung Rudd, MD  pantoprazole (PROTONIX) 40 MG tablet Take 1 tablet (40 mg total) by mouth 2 (two) times daily. Patient taking differently: Take 40 mg by mouth daily. 03/09/21   Domenic Polite, MD  prochlorperazine (COMPAZINE) 10 MG tablet Take 1 tablet (10 mg total) by mouth every 6 (six) hours as needed for nausea or vomiting. 12/20/21   [name] Pike, MD  sertraline (ZOLOFT) 25 MG tablet Take 1 tablet (25 mg total) by mouth daily. 01/11/22   Laqueta Linden, MD  spironolactone (ALDACTONE) 25 MG tablet Take 1/2 tablet (12.5 mg total) by mouth daily. 03/28/22 04/27/22  Arrien, Jimmy Picket, MD  Vitamin D, Ergocalciferol, (DRISDOL) 1.25 MG (50000 UNIT) CAPS capsule Take 1 capsule (50,000 Units total) by mouth every 7 (seven) days. 01/11/22   Laqueta Linden, MD      Allergies    Hydrocodone-acetaminophen, Penicillins, Shellfish allergy, Latex, and Sulfa antibiotics    Review of Systems   Review of Systems  Cardiovascular:  Positive for chest pain.    Physical Exam Updated Vital Signs BP 138/79 (BP Location: Left Arm)   Pulse 71   Temp 98.5 F (36.9 C)   Resp 14   SpO2 95%  Physical Exam Constitutional: Alert and oriented. Well appearing and in no distress. Eyes: Conjunctivae are normal. ENT      Head: Normocephalic and atraumatic. Cardiovascular: S1, S2, equal palpable radial and DP pulses, normal and symmetric distal pulses are present in all extremities.Warm and well perfused.  Reproducible chest pain of the left chest wall  in the intercostal and substernal space.  There is no external skin changes.  No erythema, no warmth, no rash. Respiratory: Normal respiratory effort. Breath sounds are normal.  O2 sat 94-95 on RA Gastrointestinal: Soft and nontender. Musculoskeletal: Normal range of motion in all extremities.      Right lower leg: No tenderness or edema.      Left lower leg: No tenderness or edema. Neurologic: Normal speech and language.  No facial droop.  Moving extremities x 4.  Sensation grossly intact.  No gross focal neurologic deficits are appreciated. Skin: Skin is warm, dry and intact. No rash noted. Psychiatric: Mood and affect are normal. Speech and behavior are normal.  ED Results / Procedures / Treatments   Labs (all labs ordered are listed,  but only abnormal results are displayed) Labs Reviewed  COMPREHENSIVE METABOLIC PANEL  CBC WITH DIFFERENTIAL/PLATELET  TROPONIN I (HIGH SENSITIVITY)    EKG EKG Interpretation  Date/Time:  Friday July 14 2022 22:44:19 EDT Ventricular Rate:  67 PR Interval:  200 QRS Duration: 123 QT Interval:  410 QTC Calculation: 433 R Axis:   58 Text Interpretation: Sinus rhythm Nonspecific intraventricular conduction delay Anteroseptal infarct, old Nonspecific T abnormalities, lateral leads No significant change since last tracing Confirmed by Georgina Snell [phone]) on 07/14/2022 11:00:51 PM  Radiology No results found.  Procedures Procedures  Remain on constant cardiac monitoring sinus rhythm with normal rates.  Medications Ordered in ED Medications - No data to display  ED Course/ Medical Decision Making/ A&P   {                            Medical Decision Making  RAYNA NICOLLS is a 62 y.o. female.with pmh HTN, HLD, CAD, CHF, PE no longer on anticoagulation, breast cancer s/p XRT presenting with chest pain.   Amount and/or Complexity of Data Reviewed Labs: ordered. Radiology: ordered.  Risk Prescription drug management.    Final  Clinical Impression(s) / ED Diagnoses Final diagnoses:  None    Rx / DC Orders ED Discharge Orders     None

## 2022-07-15 ENCOUNTER — Emergency Department (HOSPITAL_COMMUNITY): Payer: 59

## 2022-07-15 DIAGNOSIS — G4733 Obstructive sleep apnea (adult) (pediatric): Secondary | ICD-10-CM

## 2022-07-15 DIAGNOSIS — E785 Hyperlipidemia, unspecified: Secondary | ICD-10-CM

## 2022-07-15 DIAGNOSIS — Z171 Estrogen receptor negative status [ER-]: Secondary | ICD-10-CM

## 2022-07-15 DIAGNOSIS — R0789 Other chest pain: Secondary | ICD-10-CM | POA: Diagnosis not present

## 2022-07-15 DIAGNOSIS — E1169 Type 2 diabetes mellitus with other specified complication: Secondary | ICD-10-CM

## 2022-07-15 DIAGNOSIS — R918 Other nonspecific abnormal finding of lung field: Secondary | ICD-10-CM | POA: Diagnosis present

## 2022-07-15 DIAGNOSIS — C50411 Malignant neoplasm of upper-outer quadrant of right female breast: Secondary | ICD-10-CM

## 2022-07-15 DIAGNOSIS — F418 Other specified anxiety disorders: Secondary | ICD-10-CM

## 2022-07-15 DIAGNOSIS — R079 Chest pain, unspecified: Secondary | ICD-10-CM | POA: Diagnosis not present

## 2022-07-15 DIAGNOSIS — I10 Essential (primary) hypertension: Secondary | ICD-10-CM | POA: Diagnosis not present

## 2022-07-15 LAB — GLUCOSE, CAPILLARY
Glucose-Capillary: 153 mg/dL — ABNORMAL HIGH (ref 70–99)
Glucose-Capillary: 155 mg/dL — ABNORMAL HIGH (ref 70–99)
Glucose-Capillary: 143 mg/dL — ABNORMAL HIGH (ref 70–99)
Glucose-Capillary: 147 mg/dL — ABNORMAL HIGH (ref 70–99)
Glucose-Capillary: 147 mg/dL — ABNORMAL HIGH (ref 70–99)

## 2022-07-15 LAB — URINALYSIS, ROUTINE W REFLEX MICROSCOPIC
Bilirubin Urine: NEGATIVE
Glucose, UA: >=500 mg/dL — AB
Hgb urine dipstick: NEGATIVE
Ketones, ur: NEGATIVE mg/dL
Leukocytes,Ua: NEGATIVE
Nitrite: NEGATIVE
Protein, ur: NEGATIVE mg/dL
Specific Gravity, Urine: 1.041 — ABNORMAL HIGH (ref 1.005–1.030)
pH: 5 (ref 5.0–8.0)

## 2022-07-15 LAB — TROPONIN I (HIGH SENSITIVITY)
Troponin I (High Sensitivity): 8 ng/L (ref <18)
Troponin I (High Sensitivity): 8 ng/L (ref <18)

## 2022-07-15 LAB — BASIC METABOLIC PANEL
Anion gap: 9 (ref 5–15)
BUN: 11 mg/dL (ref 8–23)
CO2: 24 mmol/L (ref 22–32)
Calcium: 8.9 mg/dL (ref 8.9–10.3)
Chloride: 106 mmol/L (ref 98–111)
Creatinine, Ser: 1.05 mg/dL — ABNORMAL HIGH (ref 0.44–1.00)
GFR, Estimated: >60 mL/min (ref >60)
Glucose, Bld: 146 mg/dL — ABNORMAL HIGH (ref 70–99)
Potassium: 3.7 mmol/L (ref 3.5–5.1)
Sodium: 139 mmol/L (ref 135–145)

## 2022-07-15 LAB — MAGNESIUM: Magnesium: 1.9 mg/dL (ref 1.7–2.4)

## 2022-07-15 LAB — CBC
HCT: 39.6 % (ref 36.0–46.0)
Hemoglobin: 12.8 g/dL (ref 12.0–15.0)
MCH: 27.6 pg (ref 26.0–34.0)
MCHC: 32.3 g/dL (ref 30.0–36.0)
MCV: 85.5 fL (ref 80.0–100.0)
Platelets: 164 10*3/uL (ref 150–400)
RBC: 4.63 MIL/uL (ref 3.87–5.11)
RDW: 13.7 % (ref 11.5–15.5)
WBC: 3.7 10*3/uL — ABNORMAL LOW (ref 4.0–10.5)
nRBC: 0 % (ref 0.0–0.2)

## 2022-07-15 LAB — RESP PANEL BY RT-PCR (RSV, FLU A&B, COVID)  RVPGX2
Influenza A by PCR: NEGATIVE
Influenza B by PCR: NEGATIVE
Resp Syncytial Virus by PCR: NEGATIVE
SARS Coronavirus 2 by RT PCR: NEGATIVE

## 2022-07-15 MED ORDER — ATORVASTATIN CALCIUM 40 MG PO TABS
40.0000 mg | ORAL_TABLET | Freq: Every day | ORAL | Status: DC
  Administered 2022-07-15: 40 mg via ORAL
  Filled 2022-07-15: qty 1

## 2022-07-15 MED ORDER — ENOXAPARIN SODIUM 40 MG/0.4ML IJ SOSY
40.0000 mg | PREFILLED_SYRINGE | INTRAMUSCULAR | Status: DC
  Administered 2022-07-15 – 2022-07-16 (×2): 40 mg via SUBCUTANEOUS
  Filled 2022-07-15 (×2): qty 0.4

## 2022-07-15 MED ORDER — INSULIN ASPART 100 UNIT/ML IJ SOLN: 0.0000 [IU] | Freq: Every day | INTRAMUSCULAR | Status: DC

## 2022-07-15 MED ORDER — ASPIRIN 81 MG PO CHEW
81.0000 mg | CHEWABLE_TABLET | Freq: Every day | ORAL | Status: DC
  Administered 2022-07-15 – 2022-07-16 (×2): 81 mg via ORAL
  Filled 2022-07-15 (×2): qty 1

## 2022-07-15 MED ORDER — CARVEDILOL 6.25 MG PO TABS
6.2500 mg | ORAL_TABLET | Freq: Two times a day (BID) | ORAL | Status: DC
  Administered 2022-07-15 – 2022-07-16 (×3): 6.25 mg via ORAL
  Filled 2022-07-15 (×3): qty 1

## 2022-07-15 MED ORDER — IOHEXOL 350 MG/ML SOLN
75.0000 mL | Freq: Once | INTRAVENOUS | Status: AC | PRN
  Administered 2022-07-15: 75 mL via INTRAVENOUS

## 2022-07-15 MED ORDER — ALUM & MAG HYDROXIDE-SIMETH 200-200-20 MG/5ML PO SUSP: 30.0000 mL | Freq: Four times a day (QID) | ORAL | Status: DC | PRN

## 2022-07-15 MED ORDER — INSULIN ASPART 100 UNIT/ML IJ SOLN
0.0000 [IU] | Freq: Three times a day (TID) | INTRAMUSCULAR | Status: DC
  Administered 2022-07-15: 1 [IU] via SUBCUTANEOUS

## 2022-07-15 MED ORDER — ONDANSETRON HCL 4 MG/2ML IJ SOLN
4.0000 mg | Freq: Four times a day (QID) | INTRAMUSCULAR | Status: DC | PRN
  Administered 2022-07-15: 4 mg via INTRAVENOUS
  Filled 2022-07-15: qty 2

## 2022-07-15 MED ORDER — SERTRALINE HCL 25 MG PO TABS
25.0000 mg | ORAL_TABLET | Freq: Every day | ORAL | Status: DC
  Administered 2022-07-15 – 2022-07-16 (×2): 25 mg via ORAL
  Filled 2022-07-15 (×2): qty 1

## 2022-07-15 MED ORDER — BUSPIRONE HCL 5 MG PO TABS
30.0000 mg | ORAL_TABLET | Freq: Two times a day (BID) | ORAL | Status: DC
  Administered 2022-07-15 – 2022-07-16 (×3): 30 mg via ORAL
  Filled 2022-07-15 (×3): qty 6

## 2022-07-15 MED ORDER — PANTOPRAZOLE SODIUM 40 MG PO TBEC
40.0000 mg | DELAYED_RELEASE_TABLET | Freq: Every day | ORAL | Status: DC
  Administered 2022-07-15 – 2022-07-16 (×2): 40 mg via ORAL
  Filled 2022-07-15 (×2): qty 1

## 2022-07-15 MED ORDER — ACETAMINOPHEN 325 MG PO TABS
650.0000 mg | ORAL_TABLET | ORAL | Status: DC | PRN
  Administered 2022-07-15: 650 mg via ORAL
  Filled 2022-07-15: qty 2

## 2022-07-15 MED ORDER — MORPHINE SULFATE (PF) 2 MG/ML IV SOLN
2.0000 mg | INTRAVENOUS | Status: DC | PRN
  Administered 2022-07-15: 2 mg via INTRAVENOUS
  Filled 2022-07-15: qty 1

## 2022-07-15 MED ORDER — OXYCODONE HCL 5 MG PO TABS
5.0000 mg | ORAL_TABLET | ORAL | Status: DC | PRN
  Administered 2022-07-15 (×2): 5 mg via ORAL
  Filled 2022-07-15 (×2): qty 1

## 2022-07-15 NOTE — H&P (Signed)
History and Physical    DURELLE SCREWS [id number] DOB: [birth year] DOA: 07/14/2022  PCP: Sonia Side., FNP   Patient coming from: Home   Chief Complaint: Chest pain   HPI: [name] is a pleasant 62 y.o. female with medical history significant for hypertension, type 2 diabetes mellitus, OSA on CPAP, history of PE in 2008, depression, anxiety, and cancer of the right breast who presents to the emergency department with chest pain.  Patient had been experiencing pain and swelling in her right breast recently which her oncologist attributed to likely radiation changes and lymphedema, but then developed central chest pain with nausea last night that she worried was related to her heart.  She took 324 mg of aspirin and 1 nitroglycerin before calling EMS.  EMS administered a second dose of nitroglycerin and brought her into the ED.  Patient was at rest at time of symptom onset, describes the pain as a dull pressure localized to the central chest.  It is worse with a deep breath.  She has not noticed any other exacerbating factors, and she has not found any relieving factors.  There was no improvement with nitroglycerin.  She has associated nausea without vomiting but no shortness of breath or diaphoresis.  She reports history of leg swelling but none now.  ED Course: Upon arrival to the ED, patient is found to be afebrile and saturating mid 90s on room air with normal heart rate and stable blood pressure.  EKG demonstrates sinus rhythm with nonspecific IVCD.  CTA chest is negative for PE or other acute intrathoracic abnormality but notable for dermal thickening and infiltration involving the right breast, and also new pulmonary nodules.  Labs are most notable for normal troponin x 2.  She was treated with Lidoderm and oxycodone in the ED.  Review of Systems:  All other systems reviewed and apart from HPI, are negative.  Past Medical History:  Diagnosis Date   Allergy     Anginal pain ([place])    a. NL cath in 2008;  b. Myoview 03/2011: dec uptake along mid anterior wall on stress imaging -> ? attenuation vs. ischemia, EF 65%;  c. 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 2008 -> coumadin x 6 mos.   Rheumatoid arthritis ([place])    Sleep apnea    on CPAP 02/2018   SOB (shortness of breath)    Thyroid disease    TIA (transient ischemic attack)  Urinary urgency    Vitamin D deficiency     Past Surgical History:  Procedure Laterality Date   ABDOMINAL HYSTERECTOMY  2005   APPENDECTOMY     BREAST LUMPECTOMY WITH RADIOACTIVE SEED AND SENTINEL LYMPH NODE BIOPSY Right 11/16/2021   Procedure: RIGHT BREAST RADIOACTIVE SEED LOCALIZED LUMPECTOMY WITH RIGHT AXILLARY SENTINEL LYMPH NODE BIOPSY;  Surgeon: Donnie Mesa, MD;  Location: [facility];  Service: General;  Laterality: Right;  LMA & PEC BLOCK   CARDIAC CATHETERIZATION     Normal   CARDIAC CATHETERIZATION N/A 09/30/2014   Procedure: Left Heart Cath and Coronary Angiography;  Surgeon: Sherren Mocha, MD;  Location: [facility];  Service: Cardiovascular;  Laterality: N/A;   LAPAROSCOPIC APPENDECTOMY N/A 06/03/2012   Procedure: APPENDECTOMY LAPAROSCOPIC;  Surgeon: Stark Klein, MD;  Location: [facility];  Service:  General;  Laterality: N/A;   Left knee surgery  2008   LEG SURGERY     TONSILLECTOMY     TOTAL HIP ARTHROPLASTY Left 06/11/2018   Procedure: LEFT TOTAL HIP ARTHROPLASTY ANTERIOR APPROACH;  Surgeon: Meredith Pel, MD;  Location: [facility];  Service: Orthopedics;  Laterality: Left;   TOTAL KNEE ARTHROPLASTY Left 08/22/2016   Procedure: TOTAL KNEE ARTHROPLASTY;  Surgeon: Meredith Pel, MD;  Location: [facility];  Service: Orthopedics;  Laterality: Left;   TUBAL LIGATION  1989    Social History:   reports that she quit smoking about 37 years ago. Her smoking use included cigarettes. She has a 7.50 pack-year smoking history. She has never used smokeless tobacco. She reports that she does not currently use drugs after having used the following drugs: Marijuana. She reports that she does not drink alcohol.  Allergies  Allergen Reactions   Hydrocodone-Acetaminophen Itching and Other (See Comments)    confusion   Penicillins Swelling and Other (See Comments)    Has patient had a PCN reaction causing immediate rash, facial/tongue/throat swelling, SOB or lightheadedness with hypotension: YES Has patient had a PCN reaction causing severe rash involving mucus membranes or skin necrosis: NO Has patient had a PCN reaction that required hospitalization NO Has patient had a PCN reaction occurring within the last 10 years: NO If all of the above answers are "NO", then may proceed with Cephalosporin use.   Shellfish Allergy Hives   Latex Rash   Sulfa Antibiotics Diarrhea, Itching and Rash    Family History  Problem Relation Age of Onset   Emphysema Mother    Arthritis Mother    Heart failure Mother        alive @ 42   Stroke Mother    Diabetes Mother    Hypertension Mother    Hyperlipidemia Mother    Depression Mother    Anxiety disorder Mother    Heart disease Mother    Alcoholism Mother    Depression Father    Heart disease Father        died @ 60's.   Stroke Father    Diabetes Father     Hyperlipidemia Father    Hypertension Father    Bipolar disorder Father    Sleep apnea Father    Alcoholism Father    Drug abuse Father    Sudden death Father    Diabetes Sister    Asthma Brother    Breast cancer Maternal Aunt 41 - 33   Colon cancer Maternal Grandfather        dx after 52   Heart disease Paternal Grandfather  Breast cancer Cousin        dx 8s   Esophageal cancer Neg Hx    Stomach cancer Neg Hx    Rectal cancer Neg Hx      Prior to Admission medications   Medication Sig Start Date End Date Taking? Authorizing Provider  ACCU-CHEK GUIDE test strip  10/28/21   [provider]  acetaminophen (TYLENOL) 500 MG tablet Take 1,000 mg by mouth 2 (two) times daily as needed for moderate pain.    [provider]  albuterol (PROVENTIL HFA;VENTOLIN HFA) 108 (90 Base) MCG/ACT inhaler Inhale 2 puffs into the lungs every 6 (six) hours as needed for wheezing or shortness of breath.    [provider]  aspirin 81 MG chewable tablet Chew 81 mg by mouth daily.    [provider]  atorvastatin (LIPITOR) 40 MG tablet Take 40 mg by mouth at bedtime.     [provider]  busPIRone (BUSPAR) 30 MG tablet Take 30 mg by mouth 2 (two) times daily. 12/18/19   [provider]  carvedilol (COREG) 6.25 MG tablet Take 1 tablet (6.25 mg total) by mouth 2 (two) times daily. 09/06/21   Josue Hector, MD  diclofenac Sodium (VOLTAREN) 1 % GEL Apply 2 g topically 4 (four) times daily as needed (joint/muscle pain). 09/20/19   [provider]  doxycycline (VIBRA-TABS) 100 MG tablet Take 1 tablet (100 mg total) by mouth 2 (two) times daily. 05/15/22   Hayden Pedro, PA-C  empagliflozin (JARDIANCE) 10 MG TABS tablet Take 1 tablet (10 mg total) by mouth daily. 03/28/22   Arrien, Jimmy Picket, MD  EPINEPHrine 0.3 mg/0.3 mL IJ SOAJ injection Inject 0.3 mg into the muscle as needed for anaphylaxis. Follow package instructions as needed for  severe allergy or anaphylactic reaction. 11/06/21   Carrie Mew, MD  furosemide (LASIX) 40 MG tablet Take 1 tablet (40 mg total) by mouth daily as needed for edema or fluid (take in case of weight gain 2 to 3 lbs in 24 hrs or 5 lbs in 7 days.). 03/28/22 04/27/22  Arrien, Jimmy Picket, MD  hydrOXYzine (VISTARIL) 25 MG capsule Take 25 mg by mouth 3 (three) times daily as needed for itching.    [provider]  lidocaine (LIDODERM) 5 % Place 1 patch onto the skin daily as needed for pain. 02/11/21   [provider]  lidocaine-prilocaine (EMLA) cream Apply to affected area once Patient taking differently: Apply 1 Application topically once. Apply to affected area once 12/20/21   [name] Pike, MD  loperamide (IMODIUM) 2 MG capsule Take 1 capsule (2 mg total) by mouth 4 (four) times daily as needed for diarrhea or loose stools. 01/07/22   Long, Wonda Olds, MD  losartan (COZAAR) 25 MG tablet Take 1 tablet (25 mg total) by mouth daily. 03/28/22 04/27/22  Arrien, Jimmy Picket, MD  metFORMIN (GLUCOPHAGE) 500 MG tablet Take 1 tablet (500 mg total) by mouth daily with breakfast. 12/19/21   Laqueta Linden, MD  nitroGLYCERIN (NITROSTAT) 0.4 MG SL tablet Place 1 tablet (0.4 mg total) under the tongue every 5 (five) minutes as needed for chest pain. 04/11/22   Elgie Collard, PA-C  nystatin (MYCOSTATIN/NYSTOP) powder Apply 1 application topically 3 (three) times daily as needed for rash.    [provider]  ondansetron (ZOFRAN-ODT) 4 MG disintegrating tablet Take 1 tablet (4 mg total) by mouth every 8 (eight) hours as needed for nausea or vomiting. 01/07/22   Long,  Wonda Olds, MD  oxyCODONE (OXY IR/ROXICODONE) 5 MG immediate release tablet Take 1 tablet (5 mg total) by mouth every 6 (six) hours as needed for severe pain. 04/07/22   Kyung Rudd, MD  pantoprazole (PROTONIX) 40 MG tablet Take 1 tablet (40 mg total) by mouth 2 (two) times daily. Patient taking differently: Take 40 mg by  mouth daily. 03/09/21   Domenic Polite, MD  prochlorperazine (COMPAZINE) 10 MG tablet Take 1 tablet (10 mg total) by mouth every 6 (six) hours as needed for nausea or vomiting. 12/20/21   [name] Pike, MD  sertraline (ZOLOFT) 25 MG tablet Take 1 tablet (25 mg total) by mouth daily. 01/11/22   Laqueta Linden, MD  spironolactone (ALDACTONE) 25 MG tablet Take 1/2 tablet (12.5 mg total) by mouth daily. 03/28/22 04/27/22  Arrien, Jimmy Picket, MD  Vitamin D, Ergocalciferol, (DRISDOL) 1.25 MG (50000 UNIT) CAPS capsule Take 1 capsule (50,000 Units total) by mouth every 7 (seven) days. 01/11/22   Laqueta Linden, MD    Physical Exam: Vitals:   07/15/22 0200 07/15/22 0215 07/15/22 0225 07/15/22 0300  BP: 113/61 129/66  136/72  Pulse: 82 84  81  Resp: 18 15  20   Temp:   98.2 F (36.8 C)   TempSrc:   Oral   SpO2: 94% 93%  93%    Constitutional: NAD, calm  Eyes: PERTLA, lids and conjunctivae normal ENMT: Mucous membranes are moist. Posterior pharynx clear of any exudate or lesions.   Neck: supple, no masses  Respiratory:  no wheezing, no crackles. No accessory muscle use.  Cardiovascular: S1 & S2 heard, regular rate and rhythm. No extremity edema.  Abdomen: No distension, no tenderness, soft. Bowel sounds active.  Musculoskeletal: no clubbing / cyanosis. No joint deformity upper and lower extremities.   Skin: no significant rashes, lesions, ulcers. Warm, dry, well-perfused. Neurologic: CN 2-12 grossly intact. Moving all extremities. Alert and oriented.  Psychiatric: Pleasant. Cooperative.    Labs and Imaging on Admission: I have personally reviewed following labs and imaging studies  CBC: Recent Labs  Lab 07/14/22 2257  WBC 3.8*  NEUTROABS 1.4*  HGB 13.0  HCT 41.9  MCV 87.8  PLT 123XX123   Basic Metabolic Panel: Recent Labs  Lab 07/14/22 2257  NA 143  K 3.5  CL 106  CO2 24  GLUCOSE 121*  BUN 11  CREATININE 1.05*  CALCIUM 9.2   GFR: Estimated Creatinine Clearance:  55.2 mL/min (A) (by C-G formula based on SCr of 1.05 mg/dL (H)). Liver Function Tests: Recent Labs  Lab 07/14/22 2257  AST 21  ALT 27  ALKPHOS 61  BILITOT 0.6  PROT 6.4*  ALBUMIN 3.5   No results for input(s): "LIPASE", "AMYLASE" in the last 168 hours. No results for input(s): "AMMONIA" in the last 168 hours. Coagulation Profile: No results for input(s): "INR", "PROTIME" in the last 168 hours. Cardiac Enzymes: No results for input(s): "CKTOTAL", "CKMB", "CKMBINDEX", "TROPONINI" in the last 168 hours. BNP (last 3 results) Recent Labs    04/11/22 1606  PROBNP 54   HbA1C: No results for input(s): "HGBA1C" in the last 72 hours. CBG: No results for input(s): "GLUCAP" in the last 168 hours. Lipid Profile: No results for input(s): "CHOL", "HDL", "LDLCALC", "TRIG", "CHOLHDL", "LDLDIRECT" in the last 72 hours. Thyroid Function Tests: No results for input(s): "TSH", "T4TOTAL", "FREET4", "T3FREE", "THYROIDAB" in the last 72 hours. Anemia Panel: No results for input(s): "VITAMINB12", "FOLATE", "FERRITIN", "TIBC", "IRON", "RETICCTPCT" in the last 72 hours. Urine analysis:  Component Value Date/Time   COLORURINE YELLOW 07/15/2022 0107   APPEARANCEUR CLEAR 07/15/2022 0107   LABSPEC 1.041 (H) 07/15/2022 0107   PHURINE 5.0 07/15/2022 0107   GLUCOSEU >=500 (A) 07/15/2022 0107   HGBUR NEGATIVE 07/15/2022 0107   BILIRUBINUR NEGATIVE 07/15/2022 0107   KETONESUR NEGATIVE 07/15/2022 0107   PROTEINUR NEGATIVE 07/15/2022 0107   UROBILINOGEN 0.2 09/27/2014 1920   NITRITE NEGATIVE 07/15/2022 0107   LEUKOCYTESUR NEGATIVE 07/15/2022 0107   Sepsis Labs: @LABRCNTIP (procalcitonin:4,lacticidven:4) ) Recent Results (from the past 240 hour(s))  Resp panel by RT-PCR (RSV, Flu A&B, Covid) Anterior Nasal Swab     Status: None   Collection Time: 07/15/22 12:25 AM   Specimen: Anterior Nasal Swab  Result Value Ref Range Status   SARS Coronavirus 2 by RT PCR NEGATIVE NEGATIVE Final   Influenza A  by PCR NEGATIVE NEGATIVE Final   Influenza B by PCR NEGATIVE NEGATIVE Final    Comment: (NOTE) The Xpert Xpress SARS-CoV-2/FLU/RSV plus assay is intended as an aid in the diagnosis of influenza from Nasopharyngeal swab specimens and should not be used as a sole basis for treatment. Nasal washings and aspirates are unacceptable for Xpert Xpress SARS-CoV-2/FLU/RSV testing.  Fact Sheet for Patients: EntrepreneurPulse.com.au  Fact Sheet for Healthcare Providers: IncredibleEmployment.be  This test is not yet approved or cleared by the [place] FDA and has been authorized for detection and/or diagnosis of SARS-CoV-2 by FDA under an Emergency Use Authorization (EUA). This EUA will remain in effect (meaning this test can be used) for the duration of the COVID-19 declaration under Section 564(b)(1) of the Act, 21 U.S.C. section 360bbb-3(b)(1), unless the authorization is terminated or revoked.     Resp Syncytial Virus by PCR NEGATIVE NEGATIVE Final    Comment: (NOTE) Fact Sheet for Patients: EntrepreneurPulse.com.au  Fact Sheet for Healthcare Providers: IncredibleEmployment.be  This test is not yet approved or cleared by the [place] FDA and has been authorized for detection and/or diagnosis of SARS-CoV-2 by FDA under an Emergency Use Authorization (EUA). This EUA will remain in effect (meaning this test can be used) for the duration of the COVID-19 declaration under Section 564(b)(1) of the Act, 21 U.S.C. section 360bbb-3(b)(1), unless the authorization is terminated or revoked.  Performed at [facility], [place] [address] [place],  [address]      Radiological Exams on Admission: CT Angio Chest PE W and/or Wo Contrast  Result Date: 07/15/2022 CLINICAL DATA:  left sided chest pain, h/o breast cancer and PE EXAM: CT ANGIOGRAPHY CHEST WITH CONTRAST TECHNIQUE: Multidetector CT imaging of  the chest was performed using the standard protocol during bolus administration of intravenous contrast. Multiplanar CT image reconstructions and MIPs were obtained to evaluate the vascular anatomy. RADIATION DOSE REDUCTION: This exam was performed according to the departmental dose-optimization program which includes automated exposure control, adjustment of the mA and/or kV according to patient size and/or use of iterative reconstruction technique. CONTRAST:  81mL OMNIPAQUE IOHEXOL 350 MG/ML SOLN COMPARISON:  03/23/2022 FINDINGS: Cardiovascular: Adequate opacification of the pulmonary arterial tree. No intraluminal filling defect identified to suggest acute pulmonary embolism. Central pulmonary arteries are of normal caliber. No significant coronary artery calcification. Cardiac size within normal limits. No pericardial effusion. Mild atherosclerotic calcification within the thoracic aorta. No aortic aneurysm. 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 Stable 4.2 cm right thyroid mass, previously biopsied in 10/2020. No follow-up imaging is recommended for this lesion. No  pathologic mediastinal, hilar, or axillary adenopathy. The esophagus is unremarkable. Lungs/Pleura: 6 mm pulmonary nodule, subpleural right upper lobe, axial image # 38/7, new since prior examination. 3 mm noncalcified right upper lobe pulmonary nodule, axial image # 51/7, new since prior examination. Mild bibasilar atelectasis. Lungs are otherwise clear. No pneumothorax or pleural effusion. No central obstructing lesion. Upper Abdomen: No acute abnormality. Musculoskeletal: Interval development of marked dermal thickening and infiltration of the right breast possibly reflecting post radiation changes. Surgical clips related to right breast lumpectomy noted. No acute bone abnormality. Stable bone island within the T2 vertebral body. No suspicious lytic  or blastic bone lesions are seen. Review of the MIP images confirms the above findings. IMPRESSION: 1. No pulmonary embolism. No acute intrathoracic pathology identified. 2. Interval development of marked dermal thickening and infiltration of the right breast possibly reflecting post radiation changes. Surgical clips related to right breast lumpectomy noted. 3. New pulmonary nodules within the subpleural right upper lobe may relate to treatment changes within the right breast, but are indeterminate. Close attention on surveillance CT imaging is recommended. 4. 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 Stable 4.2 cm right thyroid mass, previously biopsied in 10/2020. No follow-up imaging is recommended for these lesions. Aortic Atherosclerosis (ICD10-I70.0). Electronically Signed   By: Fidela Salisbury M.D.   On: 07/15/2022 00:51   DG Chest 2 View  Result Date: 07/14/2022 CLINICAL DATA:  Chest pain EXAM: CHEST - 2 VIEW COMPARISON:  01/06/2022 FINDINGS: Mild cardiomegaly with central congestion. No focal airspace disease, pleural effusion or pneumothorax. IMPRESSION: Mild cardiomegaly with central vascular congestion Electronically Signed   By: Donavan Foil M.D.   On: 07/14/2022 23:31    EKG: Independently reviewed. Sinus rhythm, non-specific IVCD, repolarization abnormality.   Assessment/Plan   1. Chest pain  - Troponin normal x2, no PE or other acute cardiopulmonary findings on CTA chest, and EKG shows SR with non-specific IVCD and repolarization abnormality  - There was no relief with nitroglycerin, pain is non-exertional and reproducible, but she continues to have significant pain and has increased ST-T abnormality noted on repeat EKG  - Cardiology reviewed EKG and felt changes could be from increased HR  - Continue cardiac monitoring, check a third troponin, continue ASA, statin, and beta-blocker, continue pain-control    2. Hypertension  - Continue  Coreg    3. Type II DM - A1c was 6.4% in June 2023  - Check CBGs and use low-intensity SSI for now   4. OSA  - CPAP qHS   5. Depression, anxiety  - Continue Buspar and Zoloft   6. Lung nodules  - Noted on CT in ED  - Outpatient follow-up recommended   7. Breast cancer  - Right breast IDC s/p lumpectomy, chemotherapy, and adjuvant radiation completed in January 2024  - Continue outpatient follow-up as planned     DVT prophylaxis: Lovenox  Code Status: Full  Level of Care: Level of care: Telemetry Cardiac Family Communication: None present  Disposition Plan:  Patient is from: home  Anticipated d/c is to: Home  Anticipated d/c date is: 07/16/22  Patient currently: Pending cardiac monitoring, repeat troponin, pain-control  Consults called: None  Admission status: Observation     Vianne Bulls, MD [facility]  07/15/2022, 3:16 AM

## 2022-07-15 NOTE — ED Notes (Signed)
ED TO INPATIENT HANDOFF REPORT  ED Nurse Name and Phone #: [phone] , [place] Name/Age/Gender [name] 62 y.o. female Room/Bed: 016C/016C  Code Status   Code Status: Prior  Home/SNF/Other Home Patient oriented to: self, place, time, and situation Is this baseline? Yes   Triage Complete: Triage complete  Chief Complaint Chest pain [R07.9]  Triage Note PT BIB EMS from home with c/o chest pain that started around 2000 tonight. Pain is dull pressure, non radiating in center of chest. Pt took 324mg  asa and 0.4mg  nitro at home prior to EMS arrival. Pt given additional 0.4 mg nitro by EMS. GCS 15. Hx of R lumpectomy last year.    Allergies Allergies  Allergen Reactions   Hydrocodone-Acetaminophen Itching and Other (See Comments)    confusion   Penicillins Swelling and Other (See Comments)    Has patient had a PCN reaction causing immediate rash, facial/tongue/throat swelling, SOB or lightheadedness with hypotension: YES Has patient had a PCN reaction causing severe rash involving mucus membranes or skin necrosis: NO Has patient had a PCN reaction that required hospitalization NO Has patient had a PCN reaction occurring within the last 10 years: NO If all of the above answers are "NO", then may proceed with Cephalosporin use.   Shellfish Allergy Hives   Latex Rash   Sulfa Antibiotics Diarrhea, Itching and Rash    Level of Care/Admitting Diagnosis ED Disposition     ED Disposition  Admit   Condition  --   Comment  Hospital Area: [place] [[id number]]  Level of Care: Telemetry Cardiac [[id number]]  May place patient in observation at [facility] or [place] if equivalent level of care is available:: Yes  Covid Evaluation: Confirmed COVID Negative  Diagnosis: Chest pain [id number]  Admitting Physician: Vianne Bulls [id number]  Attending Physician: Vianne Bulls [id number]          B Medical/Surgery History Past Medical History:   Diagnosis Date   Allergy    Anginal pain ([place])    a. NL cath in 2008;  b. Myoview 03/2011: dec uptake along mid anterior wall on stress imaging -> ? attenuation vs. ischemia, EF 65%;  c. Echo 04/2011: EF 55-60%, no RWMA, Gr 2 dd   Anxiety    Arthritis    Asthma    Back pain    Bone cancer ([place])    Cancer ([place])    Chest pain    CHF (congestive heart failure) (HCC)    Clotting disorder (HCC)    Constipation    Depression    Diabetes mellitus    Drug use    Dyspnea    Frequent urination    GERD (gastroesophageal reflux disease)    Glaucoma    History of stomach ulcers    HLD (hyperlipidemia)    Hypertension    IBS (irritable bowel syndrome)    Joint pain    Joint pain    Lactose intolerance    Leg edema    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 benign thymoma   Neuromuscular disorder ([place])    Obesity    Palpitations    Pneumonia 05/2016   double   Pulmonary edema    Pulmonary embolism ([place])    a. 2008 -> coumadin x 6 mos.   Rheumatoid arthritis ([place])    Sleep apnea    on CPAP 02/2018   SOB (shortness of breath)    Thyroid disease  TIA (transient ischemic attack)    Urinary urgency    Vitamin D deficiency    Past Surgical History:  Procedure Laterality Date   ABDOMINAL HYSTERECTOMY  2005   APPENDECTOMY     BREAST LUMPECTOMY WITH RADIOACTIVE SEED AND SENTINEL LYMPH NODE BIOPSY Right 11/16/2021   Procedure: RIGHT BREAST RADIOACTIVE SEED LOCALIZED LUMPECTOMY WITH RIGHT AXILLARY SENTINEL LYMPH NODE BIOPSY;  Surgeon: Donnie Mesa, MD;  Location: [facility];  Service: General;  Laterality: Right;  LMA & PEC BLOCK   CARDIAC CATHETERIZATION     Normal   CARDIAC CATHETERIZATION N/A 09/30/2014   Procedure: Left Heart Cath and Coronary Angiography;  Surgeon: Sherren Mocha, MD;  Location: [facility];  Service: Cardiovascular;  Laterality: N/A;   LAPAROSCOPIC APPENDECTOMY N/A 06/03/2012   Procedure: APPENDECTOMY LAPAROSCOPIC;  Surgeon: Stark Klein,  MD;  Location: [facility];  Service: General;  Laterality: N/A;   Left knee surgery  2008   LEG SURGERY     TONSILLECTOMY     TOTAL HIP ARTHROPLASTY Left 06/11/2018   Procedure: LEFT TOTAL HIP ARTHROPLASTY ANTERIOR APPROACH;  Surgeon: Meredith Pel, MD;  Location: [place];  Service: Orthopedics;  Laterality: Left;   TOTAL KNEE ARTHROPLASTY Left 08/22/2016   Procedure: TOTAL KNEE ARTHROPLASTY;  Surgeon: Meredith Pel, MD;  Location: [facility];  Service: Orthopedics;  Laterality: Left;   TUBAL LIGATION  1989     A IV Location/Drains/Wounds Patient Lines/Drains/Airways Status     Active Line/Drains/Airways     Name Placement date Placement time Site Days   Peripheral IV 07/14/22 18 G Left Antecubital 07/14/22  2030  Antecubital  1            Intake/Output Last 24 hours No intake or output data in the 24 hours ending 07/15/22 0255  Labs/Imaging Results for orders placed or performed during the hospital encounter of 07/14/22 (from the past 48 hour(s))  Comprehensive metabolic panel     Status: Abnormal   Collection Time: 07/14/22 10:57 PM  Result Value Ref Range   Sodium 143 135 - 145 mmol/L   Potassium 3.5 3.5 - 5.1 mmol/L   Chloride 106 98 - 111 mmol/L   CO2 24 22 - 32 mmol/L   Glucose, Bld 121 (H) 70 - 99 mg/dL    Comment: Glucose reference range applies only to samples taken after fasting for at least 8 hours.   BUN 11 8 - 23 mg/dL   Creatinine, Ser 1.05 (H) 0.44 - 1.00 mg/dL   Calcium 9.2 8.9 - 10.3 mg/dL   Total Protein 6.4 (L) 6.5 - 8.1 g/dL   Albumin 3.5 3.5 - 5.0 g/dL   AST 21 15 - 41 U/L   ALT 27 0 - 44 U/L   Alkaline Phosphatase 61 38 - 126 U/L   Total Bilirubin 0.6 0.3 - 1.2 mg/dL   GFR, Estimated >60 >60 mL/min    Comment: (NOTE) Calculated using the CKD-EPI Creatinine Equation (2021)    Anion gap 13 5 - 15    Comment: Performed at [facility], [place] [address], [place], [place] [address]  CBC with Differential     Status: Abnormal   Collection Time:  07/14/22 10:57 PM  Result Value Ref Range   WBC 3.8 (L) 4.0 - 10.5 K/uL   RBC 4.77 3.87 - 5.11 MIL/uL   Hemoglobin 13.0 12.0 - 15.0 g/dL   HCT 41.9 36.0 - 46.0 %   MCV 87.8 80.0 - 100.0 fL  MCH 27.3 26.0 - 34.0 pg   MCHC 31.0 30.0 - 36.0 g/dL   RDW 13.3 11.5 - 15.5 %   Platelets 169 150 - 400 K/uL   nRBC 0.0 0.0 - 0.2 %   Neutrophils Relative % 36 %   Neutro Abs 1.4 (L) 1.7 - 7.7 K/uL   Lymphocytes Relative 49 %   Lymphs Abs 1.9 0.7 - 4.0 K/uL   Monocytes Relative 10 %   Monocytes Absolute 0.4 0.1 - 1.0 K/uL   Eosinophils Relative 4 %   Eosinophils Absolute 0.2 0.0 - 0.5 K/uL   Basophils Relative 1 %   Basophils Absolute 0.0 0.0 - 0.1 K/uL   Immature Granulocytes 0 %   Abs Immature Granulocytes 0.01 0.00 - 0.07 K/uL    Comment: Performed at [facility] [address], [place], [place] [address]  Troponin I (High Sensitivity)     Status: None   Collection Time: 07/14/22 10:57 PM  Result Value Ref Range   Troponin I (High Sensitivity) 8 <18 ng/L    Comment: (NOTE) Elevated high sensitivity troponin I (hsTnI) values and significant  changes across serial measurements may suggest ACS but many other  chronic and acute conditions are known to elevate hsTnI results.  Refer to the "Links" section for chest pain algorithms and additional  guidance. Performed at [facility], [place] [address], [place], [place] [address]   Resp panel by RT-PCR (RSV, Flu A&B, Covid) Anterior Nasal Swab     Status: None   Collection Time: 07/15/22 12:25 AM   Specimen: Anterior Nasal Swab  Result Value Ref Range   SARS Coronavirus 2 by RT PCR NEGATIVE NEGATIVE   Influenza A by PCR NEGATIVE NEGATIVE   Influenza B by PCR NEGATIVE NEGATIVE    Comment: (NOTE) The Xpert Xpress SARS-CoV-2/FLU/RSV plus assay is intended as an aid in the diagnosis of influenza from Nasopharyngeal swab specimens and should not be used as a sole basis for treatment. Nasal washings and aspirates are unacceptable  for Xpert Xpress SARS-CoV-2/FLU/RSV testing.  Fact Sheet for Patients: EntrepreneurPulse.com.au  Fact Sheet for Healthcare Providers: IncredibleEmployment.be  This test is not yet approved or cleared by the [place] FDA and has been authorized for detection and/or diagnosis of SARS-CoV-2 by FDA under an Emergency Use Authorization (EUA). This EUA will remain in effect (meaning this test can be used) for the duration of the COVID-19 declaration under Section 564(b)(1) of the Act, 21 U.S.C. section 360bbb-3(b)(1), unless the authorization is terminated or revoked.     Resp Syncytial Virus by PCR NEGATIVE NEGATIVE    Comment: (NOTE) Fact Sheet for Patients: EntrepreneurPulse.com.au  Fact Sheet for Healthcare Providers: IncredibleEmployment.be  This test is not yet approved or cleared by the [place] FDA and has been authorized for detection and/or diagnosis of SARS-CoV-2 by FDA under an Emergency Use Authorization (EUA). This EUA will remain in effect (meaning this test can be used) for the duration of the COVID-19 declaration under Section 564(b)(1) of the Act, 21 U.S.C. section 360bbb-3(b)(1), unless the authorization is terminated or revoked.  Performed at [facility], [place] [address] [place], [place] [address]   Troponin I (High Sensitivity)     Status: None   Collection Time: 07/15/22 12:52 AM  Result Value Ref Range   Troponin I (High Sensitivity) 8 <18 ng/L    Comment: (NOTE) Elevated high sensitivity troponin I (hsTnI) values and significant  changes across serial measurements may suggest ACS  but many other  chronic and acute conditions are known to elevate hsTnI results.  Refer to the "Links" section for chest pain algorithms and additional  guidance. Performed at [facility], [place] [address], [place], [place] [address]   Urinalysis, Routine w reflex microscopic -Urine,  Clean Catch     Status: Abnormal   Collection Time: 07/15/22  1:07 AM  Result Value Ref Range   Color, Urine YELLOW YELLOW   APPearance CLEAR CLEAR   Specific Gravity, Urine 1.041 (H) 1.005 - 1.030   pH 5.0 5.0 - 8.0   Glucose, UA >=500 (A) NEGATIVE mg/dL   Hgb urine dipstick NEGATIVE NEGATIVE   Bilirubin Urine NEGATIVE NEGATIVE   Ketones, ur NEGATIVE NEGATIVE mg/dL   Protein, ur NEGATIVE NEGATIVE mg/dL   Nitrite NEGATIVE NEGATIVE   Leukocytes,Ua NEGATIVE NEGATIVE   RBC / HPF 0-5 0 - 5 RBC/hpf   WBC, UA 0-5 0 - 5 WBC/hpf   Bacteria, UA RARE (A) NONE SEEN   Squamous Epithelial / HPF 0-5 0 - 5 /HPF   Mucus PRESENT     Comment: Performed at [facility], [address], [place], [place] [address]   *Note: Due to a large number of results and/or encounters for the requested time period, some results have not been displayed. A complete set of results can be found in Results Review.   CT Angio Chest PE W and/or Wo Contrast  Result Date: 07/15/2022 CLINICAL DATA:  left sided chest pain, h/o breast cancer and PE EXAM: CT ANGIOGRAPHY CHEST WITH CONTRAST TECHNIQUE: Multidetector CT imaging of the chest was performed using the standard protocol during bolus administration of intravenous contrast. Multiplanar CT image reconstructions and MIPs were obtained to evaluate the vascular anatomy. RADIATION DOSE REDUCTION: This exam was performed according to the departmental dose-optimization program which includes automated exposure control, adjustment of the mA and/or kV according to patient size and/or use of iterative reconstruction technique. CONTRAST:  15mL OMNIPAQUE IOHEXOL 350 MG/ML SOLN COMPARISON:  03/23/2022 FINDINGS: Cardiovascular: Adequate opacification of the pulmonary arterial tree. No intraluminal filling defect identified to suggest acute pulmonary embolism. Central pulmonary arteries are of normal caliber. No significant coronary artery calcification. Cardiac size within normal limits.  No pericardial effusion. Mild atherosclerotic calcification within the thoracic aorta. No aortic aneurysm. 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 Stable 4.2 cm right thyroid mass, previously biopsied in 10/2020. No follow-up imaging is recommended for this lesion. No pathologic mediastinal, hilar, or axillary adenopathy. The esophagus is unremarkable. Lungs/Pleura: 6 mm pulmonary nodule, subpleural right upper lobe, axial image # 38/7, new since prior examination. 3 mm noncalcified right upper lobe pulmonary nodule, axial image # 51/7, new since prior examination. Mild bibasilar atelectasis. Lungs are otherwise clear. No pneumothorax or pleural effusion. No central obstructing lesion. Upper Abdomen: No acute abnormality. Musculoskeletal: Interval development of marked dermal thickening and infiltration of the right breast possibly reflecting post radiation changes. Surgical clips related to right breast lumpectomy noted. No acute bone abnormality. Stable bone island within the T2 vertebral body. No suspicious lytic or blastic bone lesions are seen. Review of the MIP images confirms the above findings. IMPRESSION: 1. No pulmonary embolism. No acute intrathoracic pathology identified. 2. Interval development of marked dermal thickening and infiltration of the right breast possibly reflecting post radiation changes. Surgical clips related to right breast lumpectomy noted. 3. New pulmonary nodules within the subpleural right upper  lobe may relate to treatment changes within the right breast, but are indeterminate. Close attention on surveillance CT imaging is recommended. 4. 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 Stable 4.2 cm right thyroid mass, previously biopsied in 10/2020. No follow-up imaging is recommended for these lesions. Aortic Atherosclerosis (ICD10-I70.0).  Electronically Signed   By: Fidela Salisbury M.D.   On: 07/15/2022 00:51   DG Chest 2 View  Result Date: 07/14/2022 CLINICAL DATA:  Chest pain EXAM: CHEST - 2 VIEW COMPARISON:  01/06/2022 FINDINGS: Mild cardiomegaly with central congestion. No focal airspace disease, pleural effusion or pneumothorax. IMPRESSION: Mild cardiomegaly with central vascular congestion Electronically Signed   By: Donavan Foil M.D.   On: 07/14/2022 23:31    Pending Labs Unresulted Labs (From admission, onward)    None       Vitals/Pain Today's Vitals   07/15/22 0200 07/15/22 0215 07/15/22 0224 07/15/22 0225  BP: 113/61 129/66    Pulse: 82 84    Resp: 18 15    Temp:    98.2 F (36.8 C)  TempSrc:    Oral  SpO2: 94% 93%    PainSc:   Asleep     Isolation Precautions No active isolations  Medications Medications  lidocaine (LIDODERM) 5 % 1 patch (1 patch Transdermal Patch Applied 07/15/22 0054)  oxyCODONE (Oxy IR/ROXICODONE) immediate release tablet 5 mg (5 mg Oral Given 07/15/22 0054)  iohexol (OMNIPAQUE) 350 MG/ML injection 75 mL (75 mLs Intravenous Contrast Given 07/15/22 0037)    Mobility walks     Focused Assessments Cardiac Assessment Handoff:    Lab Results  Component Value Date   CKTOTAL 118 10/28/2012   CKMB 2.2 10/28/2012   CKMBINDEX 1.9 10/12/2016   TROPONINI 0.07 (HH) 09/02/2018   Lab Results  Component Value Date   DDIMER 1.03 (H) 10/14/2020   Does the Patient currently have chest pain? No    R Recommendations: See Admitting Provider Note  Report given to:   Additional Notes: Pt is A& O x4 and currently no pain .

## 2022-07-15 NOTE — Progress Notes (Signed)
Care started prior to midnight in the emergency room and patient was admitted early this morning after midnight by Dr. Christia Reading Opyd and I am in current agreement with his assessment and plan.  Additional changes to plan of care been made accordingly.  The patient is a 62 year old morbidly obese African-American female with past medical history significant for but to hypertension, diabetes mellitus type 2, OSA on CPAP, history of PE in 2008, depression and anxiety as well as cancer of the right breast who presents to the emergency department chest pain.  Is been having pain and swelling her right breast recently which her oncologist attributed to likely radiation changes and lymphedema but then she developed central chest pain with nausea that the night prior to admission and she was worried that it was her heart.  She took 325 mg of aspirin and nitroglycerin before calling EMS.  EMS administered a second dose of nitro and brought to the ED.  At the time of the symptom onset she described the pain as a dull pressure localized to central chest worse with deep breathing.  She had not noticed any exacerbating factors and not found any relieving factors and there is no real improvement with nitroglycerin.  She had some associated nausea but no vomiting and reports a history of leg swelling but none now.  Upon arrival to the ED she is found to be afebrile and saturating mid 90s.  EKG showed sinus rhythm with nonspecific IVCD.  CT of the chest was negative for PE and other intrathoracic abnormalities but was notable for dermal thickening and infiltration of the right breast as well as 2 new pulmonary nodules.  She was treated with Lidoderm and oxycodone in the ED and brought in for chest pain observation.  Currently she is being admitted and treated for the following but not limited too:   Chest pain  - Troponin normal x2, no PE or other acute cardiopulmonary findings on CTA chest, and EKG shows SR with non-specific  IVCD and repolarization abnormality  -here was no relief with nitroglycerin, pain was non-exertional and reproducible, but she continues to have significant pain and has increased ST-T abnormality noted on repeat EKG  -Cardiology reviewed EKG and felt changes could be from increased HR  -Continue cardiac monitoring, check a third troponin, continue ASA, statin, and beta-blocker, continue pain-control   -Obtain echocardiogram and discussed with cardiology in the morning for possible formal consultation   Hypertension  -Continue carvedilol -Continue monitor blood pressures per protocol -Last blood pressure reading was 147/81   Type II DM - A1c was 6.4% in June 2023  - Check CBGs and use low-intensity SSI for now  Recent Labs  Lab 07/15/22 0602 07/15/22 0804 07/15/22 1140 07/15/22 1610  GLUCAP 153* 155* 143* 147*   OSA  - CPAP qHS    Depression, anxiety  - Continue Buspar and Zoloft    Lung nodules  - Noted on CT in ED  - Outpatient follow-up recommended    Breast cancer  - Right breast IDC s/p lumpectomy, chemotherapy, and adjuvant radiation completed in January 2024  - Continue outpatient follow-up as planned     Morbid Obesity -Complicates overall prognosis and care -Estimated body mass index is 44.04 kg/m as calculated from the following:   Height as of this encounter: 4\' 10"  (1.473 m).   Weight as of this encounter: 95.6 kg.  -Weight Loss and Dietary Counseling given  Continue to monitor the patient's clinical response to intervention and repeat  blood work and imaging in the a.m. and follow-up on the echocardiogram and discussed with cardiology for further direction if abnormal.

## 2022-07-16 ENCOUNTER — Observation Stay (HOSPITAL_BASED_OUTPATIENT_CLINIC_OR_DEPARTMENT_OTHER): Payer: 59

## 2022-07-16 DIAGNOSIS — C50411 Malignant neoplasm of upper-outer quadrant of right female breast: Secondary | ICD-10-CM | POA: Diagnosis not present

## 2022-07-16 DIAGNOSIS — E1169 Type 2 diabetes mellitus with other specified complication: Secondary | ICD-10-CM | POA: Diagnosis not present

## 2022-07-16 DIAGNOSIS — R0789 Other chest pain: Secondary | ICD-10-CM

## 2022-07-16 DIAGNOSIS — R079 Chest pain, unspecified: Secondary | ICD-10-CM

## 2022-07-16 DIAGNOSIS — F418 Other specified anxiety disorders: Secondary | ICD-10-CM | POA: Diagnosis not present

## 2022-07-16 LAB — CBC WITH DIFFERENTIAL/PLATELET
Abs Immature Granulocytes: 0 10*3/uL (ref 0.00–0.07)
Basophils Absolute: 0 10*3/uL (ref 0.0–0.1)
Basophils Relative: 1 %
Eosinophils Absolute: 0.2 10*3/uL (ref 0.0–0.5)
Eosinophils Relative: 5 %
HCT: 41.3 % (ref 36.0–46.0)
Hemoglobin: 13.1 g/dL (ref 12.0–15.0)
Immature Granulocytes: 0 %
Lymphocytes Relative: 48 %
Lymphs Abs: 1.8 10*3/uL (ref 0.7–4.0)
MCH: 27.8 pg (ref 26.0–34.0)
MCHC: 31.7 g/dL (ref 30.0–36.0)
MCV: 87.5 fL (ref 80.0–100.0)
Monocytes Absolute: 0.4 10*3/uL (ref 0.1–1.0)
Monocytes Relative: 11 %
Neutro Abs: 1.3 10*3/uL — ABNORMAL LOW (ref 1.7–7.7)
Neutrophils Relative %: 35 %
Platelets: 165 10*3/uL (ref 150–400)
RBC: 4.72 MIL/uL (ref 3.87–5.11)
RDW: 13.4 % (ref 11.5–15.5)
WBC: 3.7 10*3/uL — ABNORMAL LOW (ref 4.0–10.5)
nRBC: 0 % (ref 0.0–0.2)

## 2022-07-16 LAB — LIPID PANEL
Cholesterol: 127 mg/dL (ref 0–200)
HDL: 48 mg/dL (ref >40)
LDL Cholesterol: 55 mg/dL (ref 0–99)
Total CHOL/HDL Ratio: 2.6 RATIO
Triglycerides: 119 mg/dL (ref <150)
VLDL: 24 mg/dL (ref 0–40)

## 2022-07-16 LAB — GLUCOSE, CAPILLARY
Glucose-Capillary: 131 mg/dL — ABNORMAL HIGH (ref 70–99)
Glucose-Capillary: 163 mg/dL — ABNORMAL HIGH (ref 70–99)

## 2022-07-16 LAB — COMPREHENSIVE METABOLIC PANEL
ALT: 26 U/L (ref 0–44)
AST: 21 U/L (ref 15–41)
Albumin: 3.2 g/dL — ABNORMAL LOW (ref 3.5–5.0)
Alkaline Phosphatase: 51 U/L (ref 38–126)
Anion gap: 11 (ref 5–15)
BUN: 14 mg/dL (ref 8–23)
CO2: 22 mmol/L (ref 22–32)
Calcium: 8.9 mg/dL (ref 8.9–10.3)
Chloride: 107 mmol/L (ref 98–111)
Creatinine, Ser: 0.97 mg/dL (ref 0.44–1.00)
GFR, Estimated: >60 mL/min (ref >60)
Glucose, Bld: 127 mg/dL — ABNORMAL HIGH (ref 70–99)
Potassium: 4.2 mmol/L (ref 3.5–5.1)
Sodium: 140 mmol/L (ref 135–145)
Total Bilirubin: 0.2 mg/dL — ABNORMAL LOW (ref 0.3–1.2)
Total Protein: 6.1 g/dL — ABNORMAL LOW (ref 6.5–8.1)

## 2022-07-16 LAB — ECHOCARDIOGRAM COMPLETE
AR max vel: 2.14 cm2
AV Area VTI: 2.08 cm2
AV Area mean vel: 2.06 cm2
AV Mean grad: 4 mmHg
AV Peak grad: 8.3 mmHg
Ao pk vel: 1.44 m/s
Area-P 1/2: 3.74 cm2
Height: 58 in
S' Lateral: 2.8 cm
Weight: 3371.2 oz

## 2022-07-16 LAB — MAGNESIUM: Magnesium: 1.9 mg/dL (ref 1.7–2.4)

## 2022-07-16 LAB — PHOSPHORUS: Phosphorus: 4.1 mg/dL (ref 2.5–4.6)

## 2022-07-16 NOTE — Progress Notes (Signed)
Mobility Specialist Progress Note:   07/16/22 1153  Mobility  Activity Ambulated with assistance in hallway  Level of Assistance Standby assist, set-up cues, supervision of patient - no hands on  Assistive Device  (hall rails)  Distance Ambulated (ft) 80 ft  Activity Response Tolerated well  $Mobility charge 1 Mobility   Pt in bed willing to participate in mobility. Complaints of R groin pain. Left in chair with call bell in reach and all needs met.   [name] Mobility Specialist Please contact via [facility] or  Rehab Office at [phone]

## 2022-07-16 NOTE — Plan of Care (Signed)
  Problem: Health Behavior/Discharge Planning: Goal: Ability to identify and utilize available resources and services will improve Outcome: Adequate for Discharge  Discharged to go home under self care

## 2022-07-16 NOTE — Consult Note (Signed)
CARDIOLOGY CONSULT NOTE       Patient ID: SHONNETTE IMPERATO MRN: [id number] DOB/AGE: 01-Dec-1960 62 y.o.  Admit date: 07/14/2022 Referring Physician: Alfredia Ferguson Primary Physician: Sonia Side., FNP Primary Cardiologist: Johnsie Cancel Reason for Consultation: Chest pain  Principal Problem:   Chest pain Active Problems:   Essential hypertension   OSA on CPAP   Type 2 diabetes mellitus with hyperlipidemia [facility])   Depression with anxiety   Malignant neoplasm of upper-outer quadrant of female breast ([place])   Lung nodules   HPI:  62 y.o. admitted 07/15/22 for chest pain. History of HTN, DM-2, OSA on CPAP history of PE 2008 and breast cancer has had right breast swelling from XRT and ? Lymphedema Central substernal chest pressure 24 hours with nausea Symptoms not exertional worse with deep breath No change in nitro ECG no acute changes chronic nonspecific inferior lateral ST changes CTA negative for PE or acute thoracic pathology some dermal thickening over right breast Troponin negative x 2 Rx with lidoderm patch and oxycodone in ER   Prior multiple normal heart caths most recently 2016 Normal myovue 04/11/19 TTE 03/24/22 EF 60-65% normal RV and no significant valve dx  Feels well this morning   ROS All other systems reviewed and negative except as noted above  Past Medical History:  Diagnosis Date   Allergy    Anginal pain ([place])    a. NL cath in 2008;  b. Myoview 03/2011: dec uptake along mid anterior wall on stress imaging -> ? attenuation vs. ischemia, EF 65%;  c. Echo 04/2011: EF 55-60%, no RWMA, Gr 2 dd   Anxiety    Arthritis    Asthma    Back pain    Bone cancer (HCC)    Cancer (HCC)    Chest pain    CHF (congestive heart failure) (HCC)    Clotting disorder (HCC)    Constipation    Depression    Diabetes mellitus    Drug use    Dyspnea    Frequent urination    GERD (gastroesophageal reflux disease)    Glaucoma    History of stomach ulcers    HLD (hyperlipidemia)     Hypertension    IBS (irritable bowel syndrome)    Joint pain    Joint pain    Lactose intolerance    Leg edema    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 benign thymoma   Neuromuscular disorder ([place])    Obesity    Palpitations    Pneumonia 05/2016   double   Pulmonary edema    Pulmonary embolism ([place])    a. 2008 -> coumadin x 6 mos.   Rheumatoid arthritis ([place])    Sleep apnea    on CPAP 02/2018   SOB (shortness of breath)    Thyroid disease    TIA (transient ischemic attack)    Urinary urgency    Vitamin D deficiency     Family History  Problem Relation Age of Onset   Emphysema Mother    Arthritis Mother    Heart failure Mother        alive @ 44   Stroke Mother    Diabetes Mother    Hypertension Mother    Hyperlipidemia Mother    Depression Mother    Anxiety disorder Mother    Heart disease Mother    Alcoholism Mother    Depression Father    Heart disease Father  died @ 78's.   Stroke Father    Diabetes Father    Hyperlipidemia Father    Hypertension Father    Bipolar disorder Father    Sleep apnea Father    Alcoholism Father    Drug abuse Father    Sudden death Father    Diabetes Sister    Asthma Brother    Breast cancer Maternal Aunt 58 - 38   Colon cancer Maternal Grandfather        dx after 19   Heart disease Paternal Grandfather    Breast cancer Cousin        dx 71s   Esophageal cancer Neg Hx    Stomach cancer Neg Hx    Rectal cancer Neg Hx     Social History   Socioeconomic History   Marital status: Divorced    Spouse name: Not on file   Number of children: 3   Years of education: Not on file   Highest education level: Not on file  Occupational History   Occupation: UNEMPLOYED    Employer: DISABLED  Tobacco Use   Smoking status: Former    Packs/day: 0.50    Years: 15.00    Additional pack years: 0.00    Total pack years: 7.50    Types: Cigarettes    Quit date: 04/24/1985    Years since quitting: 37.2   Smokeless tobacco: Never   Vaping Use   Vaping Use: Never used  Substance and Sexual Activity   Alcohol use: No    Alcohol/week: 0.0 standard drinks of alcohol   Drug use: Not Currently    Types: Marijuana   Sexual activity: Not Currently    Birth control/protection: Surgical    Comment: hyst  Other Topics Concern   Not on file  Social History Narrative   Lives in [place] by herself.  Disabled.  Occasional Tea .  3 kids, 6 grandkids.     Water aerobics 3 times a week.   Social Determinants of Health   Financial Resource Strain: High Risk (03/27/2022)   Overall Financial Resource Strain (CARDIA)    Difficulty of Paying Living Expenses: Very hard  Food Insecurity: Food Insecurity Present (03/27/2022)   Hunger Vital Sign    Worried About Running Out of Food in the Last Year: Sometimes true    Ran Out of Food in the Last Year: Sometimes true  Transportation Needs: No Transportation Needs (03/27/2022)   PRAPARE - Hydrologist (Medical): No    Lack of Transportation (Non-Medical): No  Physical Activity: Not on file  Stress: Not on file  Social Connections: Not on file  Intimate Partner Violence: Not At Risk (03/28/2022)   Humiliation, Afraid, Rape, and Kick questionnaire    Fear of Current or Ex-Partner: No    Emotionally Abused: No    Physically Abused: No    Sexually Abused: No    Past Surgical History:  Procedure Laterality Date   ABDOMINAL HYSTERECTOMY  2005   APPENDECTOMY     BREAST LUMPECTOMY WITH RADIOACTIVE SEED AND SENTINEL LYMPH NODE BIOPSY Right 11/16/2021   Procedure: RIGHT BREAST RADIOACTIVE SEED LOCALIZED LUMPECTOMY WITH RIGHT AXILLARY SENTINEL LYMPH NODE BIOPSY;  Surgeon: Donnie Mesa, MD;  Location: [place];  Service: General;  Laterality: Right;  LMA & PEC BLOCK   CARDIAC CATHETERIZATION     Normal   CARDIAC CATHETERIZATION N/A 09/30/2014   Procedure: Left Heart Cath and Coronary Angiography;  Surgeon: Sherren Mocha, MD;  Location: [place]  [facility];  Service: Cardiovascular;  Laterality: N/A;   LAPAROSCOPIC APPENDECTOMY N/A 06/03/2012   Procedure: APPENDECTOMY LAPAROSCOPIC;  Surgeon: Stark Klein, MD;  Location: [place];  Service: General;  Laterality: N/A;   Left knee surgery  2008   LEG SURGERY     TONSILLECTOMY     TOTAL HIP ARTHROPLASTY Left 06/11/2018   Procedure: LEFT TOTAL HIP ARTHROPLASTY ANTERIOR APPROACH;  Surgeon: Meredith Pel, MD;  Location: [place];  Service: Orthopedics;  Laterality: Left;   TOTAL KNEE ARTHROPLASTY Left 08/22/2016   Procedure: TOTAL KNEE ARTHROPLASTY;  Surgeon: Meredith Pel, MD;  Location: [place];  Service: Orthopedics;  Laterality: Left;   TUBAL LIGATION  1989      Current Facility-Administered Medications:    acetaminophen (TYLENOL) tablet 650 mg, 650 mg, Oral, Q4H PRN, Opyd, Ilene Qua, MD, 650 mg at 07/15/22 1103   alum & mag hydroxide-simeth (MAALOX/MYLANTA) 200-200-20 MG/5ML suspension 30 mL, 30 mL, Oral, Q6H PRN, Opyd, Ilene Qua, MD   aspirin chewable tablet 81 mg, 81 mg, Oral, Daily, Opyd, Ilene Qua, MD, 81 mg at 07/16/22 0933   atorvastatin (LIPITOR) tablet 40 mg, 40 mg, Oral, QHS, Opyd, Ilene Qua, MD, 40 mg at 07/15/22 2154   busPIRone (BUSPAR) tablet 30 mg, 30 mg, Oral, BID, Opyd, Ilene Qua, MD, 30 mg at 07/16/22 0933   carvedilol (COREG) tablet 6.25 mg, 6.25 mg, Oral, BID, Opyd, Ilene Qua, MD, 6.25 mg at 07/16/22 0933   enoxaparin (LOVENOX) injection 40 mg, 40 mg, Subcutaneous, Q24H, Opyd, Ilene Qua, MD, 40 mg at 07/16/22 0933   insulin aspart (novoLOG) injection 0-5 Units, 0-5 Units, Subcutaneous, QHS, Opyd, Timothy S, MD   insulin aspart (novoLOG) injection 0-6 Units, 0-6 Units, Subcutaneous, TID WC, Opyd, Ilene Qua, MD, 1 Units at 07/15/22 0636   lidocaine (LIDODERM) 5 % 1 patch, 1 patch, Transdermal, Q24H, Opyd, Ilene Qua, MD, 1 patch at 07/15/22 0054   morphine (PF) 2 MG/ML injection 2 mg, 2 mg, Intravenous, Q4H PRN, Opyd, Ilene Qua, MD, 2 mg at 07/15/22 1947   ondansetron  (ZOFRAN) injection 4 mg, 4 mg, Intravenous, Q6H PRN, Opyd, Ilene Qua, MD, 4 mg at 07/15/22 1947   oxyCODONE (Oxy IR/ROXICODONE) immediate release tablet 5 mg, 5 mg, Oral, Q4H PRN, Opyd, Ilene Qua, MD, 5 mg at 07/15/22 1103   pantoprazole (PROTONIX) EC tablet 40 mg, 40 mg, Oral, Daily, Opyd, Ilene Qua, MD, 40 mg at 07/16/22 0933   sertraline (ZOLOFT) tablet 25 mg, 25 mg, Oral, Daily, Opyd, Ilene Qua, MD, 25 mg at 07/16/22 [id number]  aspirin  81 mg Oral Daily   atorvastatin  40 mg Oral QHS   busPIRone  30 mg Oral BID   carvedilol  6.25 mg Oral BID   enoxaparin (LOVENOX) injection  40 mg Subcutaneous Q24H   insulin aspart  0-5 Units Subcutaneous QHS   insulin aspart  0-6 Units Subcutaneous TID WC   lidocaine  1 patch Transdermal Q24H   pantoprazole  40 mg Oral Daily   sertraline  25 mg Oral Daily     Physical Exam: Blood pressure 130/77, pulse 70, temperature 97.7 F (36.5 C), temperature source Oral, resp. rate 16, height 4\' 10"  (1.473 m), weight 95.6 kg, SpO2 97 %.    Overweight black female Lungs clear Prior right breast lumpectomy with some dermal thickening Normal heart sounds Abdomen benign post hysterectomy and appendectomy  Trace edema Post Left TKR   Labs:   Lab Results  Component  Value Date   WBC 3.7 (L) 07/15/2022   HGB 12.8 07/15/2022   HCT 39.6 07/15/2022   MCV 85.5 07/15/2022   PLT 164 07/15/2022    Recent Labs  Lab 07/14/22 2257 07/15/22 0541  NA 143 139  K 3.5 3.7  CL 106 106  CO2 24 24  BUN 11 11  CREATININE 1.05* 1.05*  CALCIUM 9.2 8.9  PROT 6.4*  --   BILITOT 0.6  --   ALKPHOS 61  --   ALT 27  --   AST 21  --   GLUCOSE 121* 146*   Lab Results  Component Value Date   CKTOTAL 118 10/28/2012   CKMB 2.2 10/28/2012   CKMBINDEX 1.9 10/12/2016   TROPONINI 0.07 (HH) 09/02/2018    Lab Results  Component Value Date   CHOL 127 07/16/2022   CHOL 127 09/28/2021   CHOL 163 06/22/2021   Lab Results  Component Value Date   HDL 48 07/16/2022   HDL 48  09/28/2021   HDL 51 06/22/2021   Lab Results  Component Value Date   LDLCALC 55 07/16/2022   LDLCALC 60 09/28/2021   LDLCALC 87 06/22/2021   Lab Results  Component Value Date   TRIG 119 07/16/2022   TRIG 99 09/28/2021   TRIG 150 06/22/2021   Lab Results  Component Value Date   CHOLHDL 2.6 07/16/2022   CHOLHDL 4.2 04/23/2011   CHOLHDL 4.3 04/23/2011   No results found for: "LDLDIRECT"    Radiology: CT Angio Chest PE W and/or Wo Contrast  Result Date: 07/15/2022 CLINICAL DATA:  left sided chest pain, h/o breast cancer and PE EXAM: CT ANGIOGRAPHY CHEST WITH CONTRAST TECHNIQUE: Multidetector CT imaging of the chest was performed using the standard protocol during bolus administration of intravenous contrast. Multiplanar CT image reconstructions and MIPs were obtained to evaluate the vascular anatomy. RADIATION DOSE REDUCTION: This exam was performed according to the departmental dose-optimization program which includes automated exposure control, adjustment of the mA and/or kV according to patient size and/or use of iterative reconstruction technique. CONTRAST:  28mL OMNIPAQUE IOHEXOL 350 MG/ML SOLN COMPARISON:  03/23/2022 FINDINGS: Cardiovascular: Adequate opacification of the pulmonary arterial tree. No intraluminal filling defect identified to suggest acute pulmonary embolism. Central pulmonary arteries are of normal caliber. No significant coronary artery calcification. Cardiac size within normal limits. No pericardial effusion. Mild atherosclerotic calcification within the thoracic aorta. No aortic aneurysm. 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 Stable 4.2 cm right thyroid mass, previously biopsied in 10/2020. No follow-up imaging is recommended for this lesion. No pathologic mediastinal, hilar, or axillary adenopathy. The esophagus is unremarkable. Lungs/Pleura: 6 mm pulmonary nodule,  subpleural right upper lobe, axial image # 38/7, new since prior examination. 3 mm noncalcified right upper lobe pulmonary nodule, axial image # 51/7, new since prior examination. Mild bibasilar atelectasis. Lungs are otherwise clear. No pneumothorax or pleural effusion. No central obstructing lesion. Upper Abdomen: No acute abnormality. Musculoskeletal: Interval development of marked dermal thickening and infiltration of the right breast possibly reflecting post radiation changes. Surgical clips related to right breast lumpectomy noted. No acute bone abnormality. Stable bone island within the T2 vertebral body. No suspicious lytic or blastic bone lesions are seen. Review of the MIP images confirms the above findings. IMPRESSION: 1. No pulmonary embolism. No acute intrathoracic pathology identified. 2. Interval development of marked dermal thickening and infiltration of the right breast possibly reflecting post radiation  changes. Surgical clips related to right breast lumpectomy noted. 3. New pulmonary nodules within the subpleural right upper lobe may relate to treatment changes within the right breast, but are indeterminate. Close attention on surveillance CT imaging is recommended. 4. 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 Stable 4.2 cm right thyroid mass, previously biopsied in 10/2020. No follow-up imaging is recommended for these lesions. Aortic Atherosclerosis (ICD10-I70.0). Electronically Signed   By: Fidela Salisbury M.D.   On: 07/15/2022 00:51   DG Chest 2 View  Result Date: 07/14/2022 CLINICAL DATA:  Chest pain EXAM: CHEST - 2 VIEW COMPARISON:  01/06/2022 FINDINGS: Mild cardiomegaly with central congestion. No focal airspace disease, pleural effusion or pneumothorax. IMPRESSION: Mild cardiomegaly with central vascular congestion Electronically Signed   By: Donavan Foil M.D.   On: 07/14/2022 23:31    EKG: See HPI   ASSESSMENT AND PLAN:   Chest  Pain: atypical R/O ECG chronic inferior lateral T wave changes CTA no PE with dermal changes from breast cancer and stable anterior mediastinal mass ? Thyroid gland extension. TTE pending Ok to d/c home if no new RWMA;s Multiple prior normal caths last in 2016 and normal myovue 04/11/23.  Will arrange outpatient PET/CT scan Have notified echo techs to expedite echo   Signed: [name] 07/16/2022, 10:24 AM

## 2022-07-17 LAB — HEMOGLOBIN A1C
Hgb A1c MFr Bld: 7.3 % — ABNORMAL HIGH (ref 4.8–5.6)
Mean Plasma Glucose: 163 mg/dL

## 2022-07-17 NOTE — Discharge Summary (Signed)
Physician Discharge Summary   Patient: [name] MRN: [id number] DOB: 1960/12/21  Admit date:     07/14/2022  Discharge date: 07/16/2022  Discharge Physician: Raiford Noble, DO   PCP: Sonia Side., FNP   Recommendations at discharge:   Follow-up with PCP within 1 to 2 weeks and repeat CBC, CMP, mag, Phos within 1 week Follow-up with cardiology in outpatient setting  Follow-up with medical oncology in outpatient setting and have outpatient PET CT scan done Have outpatient evaluation for lung nodules noted on CT scan  Discharge Diagnoses: Principal Problem:   Chest pain Active Problems:   Type 2 diabetes mellitus with hyperlipidemia ([place])   Depression with anxiety   Malignant neoplasm of upper-outer quadrant of female breast ([place])   Essential hypertension   OSA on CPAP   Lung nodules  Resolved Problems:   * No resolved hospital problems. [facility] Course: The patient is a 62 year old morbidly obese African-American female with past medical history significant for but to hypertension, diabetes mellitus type 2, OSA on CPAP, history of PE in 2008, depression and anxiety as well as cancer of the right breast who presents to the emergency department chest pain. Is been having pain and swelling her right breast recently which her oncologist attributed to likely radiation changes and lymphedema but then she developed central chest pain with nausea that the night prior to admission and she was worried that it was her heart. She took 325 mg of aspirin and nitroglycerin before calling EMS. EMS administered a second dose of nitro and brought to the ED. At the time of the symptom onset she described the pain as a dull pressure localized to central chest worse with deep breathing. She had not noticed any exacerbating factors and not found any relieving factors and there is no real improvement with nitroglycerin. She had some associated nausea but no vomiting and reports a history of leg  swelling but none now. Upon arrival to the ED she is found to be afebrile and saturating mid 90s. EKG showed sinus rhythm with nonspecific IVCD. CT of the chest was negative for PE and other intrathoracic abnormalities but was notable for dermal thickening and infiltration of the right breast as well as 2 new pulmonary nodules. She was treated with Lidoderm and oxycodone in the ED and brought in for chest pain observation.   Cardiology was consulted and she had an echocardiogram done.  Echocardiogram showed a left trickle ejection fraction 55 to 60%, no regional wall motion abnormalities and left ventricular parameters were normal.  Cardiology felt that she could safely be discharged and they have arranged outpatient PET CT scan for her.  She is medically stable for discharge at this time and will follow-up with her primary cardiologist, medical oncologist as well as her PCP within 1 to 2 weeks.  Assessment and Plan: Chest pain, improved - Troponin normal x2, no PE or other acute cardiopulmonary findings on CTA chest, and EKG shows SR with non-specific IVCD and repolarization abnormality  -here was no relief with nitroglycerin, pain was non-exertional and reproducible, but she continues to have significant pain and has increased ST-T abnormality noted on repeat EKG  -Cardiology reviewed EKG and felt changes could be from increased HR  -Continue cardiac monitoring, check a third troponin, continue ASA, statin, and beta-blocker, continue pain-control   -Cardiology was consulted and her chest pain improved.  Echocardiogram was done and showed "1. Left ventricular ejection fraction, by estimation, is 55 to 60%.  The  left ventricle has normal function. The left ventricle has no regional wall motion abnormalities. Left ventricular diastolic parameters were normal.  2. Right ventricular systolic function is normal. The right ventricular size is normal.  3. The mitral valve is abnormal. No evidence of mitral  valve  regurgitation. No evidence of mitral stenosis. 4. The aortic valve is tricuspid. There is mild calcification of the aortic valve. There is mild thickening of the aortic valve. Aortic valve regurgitation is not visualized. Aortic valve sclerosis is present, with no evidence of aortic valve stenosis. 5. Aortic dilatation noted. There is mild dilatation of the ascending  aorta, measuring 38 mm.  6. The inferior vena cava is dilated in size with >50% respiratory variability, suggesting right atrial pressure of 8 mmHg." -Cardiology is arranging for an outpatient PET CT scan and feels that she can safely be discharged that there is no regional wall motion abnormalities   Hypertension  -Continue carvedilol -Continue monitor blood pressures per protocol -Last blood pressure reading was 147/81   Type II DM - A1c was 6.4% in June 2023  - Check CBGs and use low-intensity SSI for now  Recent Labs  Lab 07/15/22 0602 07/15/22 0804 07/15/22 1140 07/15/22 1610 07/15/22 2120 07/16/22 0604 07/16/22 1207  GLUCAP 153* 155* 143* 147* 147* 131* 163*     OSA  - CPAP qHS    Depression, anxiety  - Continue Buspar and Zoloft    Lung nodules  - Noted on CT in ED  - Outpatient follow-up recommended    Breast cancer  - Right breast IDC s/p lumpectomy, chemotherapy, and adjuvant radiation completed in January 2024  - Continue outpatient follow-up as planned     Morbid Obesity -Complicates overall prognosis and care -Estimated body mass index is 44.04 kg/m as calculated from the following:   Height as of this encounter: 4\' 10"  (1.473 m).   Weight as of this encounter: 95.6 kg.  -Weight Loss and Dietary Counseling given  Consultants: Cardiology Procedures performed: ECHO  Disposition: Home Diet recommendation:  Discharge Diet Orders (From admission, onward)     Start     Ordered   07/16/22 0000  Diet - low sodium heart healthy        07/16/22 1300           Cardiac  diet DISCHARGE MEDICATION: Allergies as of 07/16/2022       Reactions   Hydrocodone-acetaminophen Itching, Other (See Comments)   confusion   Penicillins Swelling, Other (See Comments)   Has patient had a PCN reaction causing immediate rash, facial/tongue/throat swelling, SOB or lightheadedness with hypotension: YES Has patient had a PCN reaction causing severe rash involving mucus membranes or skin necrosis: NO Has patient had a PCN reaction that required hospitalization NO Has patient had a PCN reaction occurring within the last 10 years: NO If all of the above answers are "NO", then may proceed with Cephalosporin use.   Shellfish Allergy Hives   Latex Rash   Sulfa Antibiotics Diarrhea, Itching, Rash        Medication List     STOP taking these medications    doxycycline 100 MG tablet Commonly known as: VIBRA-TABS       TAKE these medications    Accu-Chek Guide test strip Generic drug: glucose blood   acetaminophen 500 MG tablet Commonly known as: TYLENOL Take 1,000 mg by mouth 2 (two) times daily as needed for moderate pain.   albuterol 108 (90 Base) MCG/ACT inhaler Commonly  known as: VENTOLIN HFA Inhale 2 puffs into the lungs every 6 (six) hours as needed for wheezing or shortness of breath.   aspirin 81 MG chewable tablet Chew 81 mg by mouth daily.   atorvastatin 40 MG tablet Commonly known as: LIPITOR Take 40 mg by mouth at bedtime.   busPIRone 30 MG tablet Commonly known as: BUSPAR Take 30 mg by mouth 2 (two) times daily.   carvedilol 6.25 MG tablet Commonly known as: COREG Take 1 tablet (6.25 mg total) by mouth 2 (two) times daily.   diclofenac Sodium 1 % Gel Commonly known as: VOLTAREN Apply 2 g topically 4 (four) times daily as needed (joint/muscle pain).   EPINEPHrine 0.3 mg/0.3 mL Soaj injection Commonly known as: EPI-PEN Inject 0.3 mg into the muscle as needed for anaphylaxis. Follow package instructions as needed for severe allergy or  anaphylactic reaction.   furosemide 40 MG tablet Commonly known as: LASIX Take 1 tablet (40 mg total) by mouth daily as needed for edema or fluid (take in case of weight gain 2 to 3 lbs in 24 hrs or 5 lbs in 7 days.).   hydrOXYzine 25 MG capsule Commonly known as: VISTARIL Take 25 mg by mouth 3 (three) times daily as needed for itching.   Jardiance 10 MG Tabs tablet Generic drug: empagliflozin Take 1 tablet (10 mg total) by mouth daily.   lidocaine 5 % Commonly known as: LIDODERM Place 1 patch onto the skin daily as needed for pain.   lidocaine-prilocaine cream Commonly known as: EMLA Apply to affected area once What changed:  how much to take how to take this when to take this   loperamide 2 MG capsule Commonly known as: IMODIUM Take 1 capsule (2 mg total) by mouth 4 (four) times daily as needed for diarrhea or loose stools.   losartan 25 MG tablet Commonly known as: COZAAR Take 1 tablet (25 mg total) by mouth daily.   metFORMIN 500 MG tablet Commonly known as: GLUCOPHAGE Take 1 tablet (500 mg total) by mouth daily with breakfast.   nitroGLYCERIN 0.4 MG SL tablet Commonly known as: NITROSTAT Place 1 tablet (0.4 mg total) under the tongue every 5 (five) minutes as needed for chest pain.   nystatin powder Commonly known as: MYCOSTATIN/NYSTOP Apply 1 application topically 3 (three) times daily as needed for rash.   ondansetron 4 MG disintegrating tablet Commonly known as: ZOFRAN-ODT Take 1 tablet (4 mg total) by mouth every 8 (eight) hours as needed for nausea or vomiting.   oxyCODONE 5 MG immediate release tablet Commonly known as: Oxy IR/ROXICODONE Take 1 tablet (5 mg total) by mouth every 6 (six) hours as needed for severe pain.   pantoprazole 40 MG tablet Commonly known as: PROTONIX Take 1 tablet (40 mg total) by mouth 2 (two) times daily. What changed: when to take this   prochlorperazine 10 MG tablet Commonly known as: COMPAZINE Take 1 tablet (10 mg  total) by mouth every 6 (six) hours as needed for nausea or vomiting.   sertraline 25 MG tablet Commonly known as: ZOLOFT Take 1 tablet (25 mg total) by mouth daily.   spironolactone 25 MG tablet Commonly known as: ALDACTONE Take 1/2 tablet (12.5 mg total) by mouth daily. What changed:  how much to take when to take this   Vitamin D (Ergocalciferol) 1.25 MG (50000 UNIT) Caps capsule Commonly known as: DRISDOL Take 1 capsule (50,000 Units total) by mouth every 7 (seven) days.  Discharge Exam: Filed Weights   07/15/22 0420  Weight: 95.6 kg   Vitals:   07/15/22 2337 07/16/22 1211  BP: 130/77 118/67  Pulse: 70 74  Resp: 16 16  Temp: 97.7 F (36.5 C) 98 F (36.7 C)  SpO2: 97% 97%   Examination: Physical Exam:  Constitutional: WN/WD morbidly obese African-American female in no acute distress appears calm Respiratory: Diminished to auscultation bilaterally with coarse breath sounds, no wheezing, rales, rhonchi or crackles. Normal respiratory effort and patient is not tachypenic. No accessory muscle use.  Unlabored breathing Cardiovascular: RRR, no murmurs / rubs / gallops. S1 and S2 auscultated. No extremity edema. Abdomen: Soft, non-tender, distended secondary to body habitus. Bowel sounds positive.  GU: Deferred. Musculoskeletal: No clubbing / cyanosis of digits/nails. No joint deformity upper and lower extremities.  Skin: No rashes, lesions, ulcers limited on a skin evaluation. No induration; Warm and dry.  Neurologic: CN 2-12 grossly intact with no focal deficits. Romberg sign cerebellar reflexes not assessed.  Psychiatric: Normal judgment and insight. Alert and oriented x 3. Normal mood and appropriate affect.   Condition at discharge: stable  The results of significant diagnostics from this hospitalization (including imaging, microbiology, ancillary and laboratory) are listed below for reference.   Imaging Studies: ECHOCARDIOGRAM COMPLETE  Result Date:  07/16/2022    ECHOCARDIOGRAM REPORT   Patient Name:   [name] Date of Exam: 07/16/2022 Medical Rec #:  [id number]           Height:       58.0 in Accession #:    [id number]          Weight:       210.7 lb Date of Birth:  [birth year]           BSA:          1.862 m Patient Age:    70 years            BP:           130/77 mmHg Patient Gender: F                   HR:           72 bpm. Exam Location:  Inpatient Procedure: 2D Echo, Cardiac Doppler and Color Doppler Indications:    Chest Pain R07.9  History:        Patient has prior history of Echocardiogram examinations, most                 recent 03/24/2022. CAD; Risk Factors:Hypertension, Diabetes and                 Dyslipidemia.  Sonographer:    Wilkie Aye RVT RCS Referring Phys: [id number] Georgina Quint LATIF [place]  1. Left ventricular ejection fraction, by estimation, is 55 to 60%. The left ventricle has normal function. The left ventricle has no regional wall motion abnormalities. Left ventricular diastolic parameters were normal.  2. Right ventricular systolic function is normal. The right ventricular size is normal.  3. The mitral valve is abnormal. No evidence of mitral valve regurgitation. No evidence of mitral stenosis.  4. The aortic valve is tricuspid. There is mild calcification of the aortic valve. There is mild thickening of the aortic valve. Aortic valve regurgitation is not visualized. Aortic valve sclerosis is present, with no evidence of aortic valve stenosis.  5. Aortic dilatation noted. There is mild dilatation of the ascending aorta, measuring 38 mm.  6. The inferior vena  cava is dilated in size with >50% respiratory variability, suggesting right atrial pressure of 8 mmHg. FINDINGS  Left Ventricle: Left ventricular ejection fraction, by estimation, is 55 to 60%. The left ventricle has normal function. The left ventricle has no regional wall motion abnormalities. The left ventricular internal cavity size was normal in size. There is  no  left ventricular hypertrophy. Left ventricular diastolic parameters were normal. Right Ventricle: The right ventricular size is normal. No increase in right ventricular wall thickness. Right ventricular systolic function is normal. Left Atrium: Left atrial size was normal in size. Right Atrium: Right atrial size was normal in size. Pericardium: Trivial pericardial effusion is present. The pericardial effusion is anterior to the right ventricle. Mitral Valve: The mitral valve is abnormal. There is mild thickening of the mitral valve leaflet(s). There is mild calcification of the mitral valve leaflet(s). Mild mitral annular calcification. No evidence of mitral valve regurgitation. No evidence of mitral valve stenosis. Tricuspid Valve: The tricuspid valve is normal in structure. Tricuspid valve regurgitation is trivial. No evidence of tricuspid stenosis. Aortic Valve: The aortic valve is tricuspid. There is mild calcification of the aortic valve. There is mild thickening of the aortic valve. Aortic valve regurgitation is not visualized. Aortic valve sclerosis is present, with no evidence of aortic valve stenosis. Aortic valve mean gradient measures 4.0 mmHg. Aortic valve peak gradient measures 8.3 mmHg. Aortic valve area, by VTI measures 2.08 cm. Pulmonic Valve: The pulmonic valve was normal in structure. Pulmonic valve regurgitation is mild. No evidence of pulmonic stenosis. Aorta: Aortic dilatation noted. There is mild dilatation of the ascending aorta, measuring 38 mm. Venous: The inferior vena cava is dilated in size with greater than 50% respiratory variability, suggesting right atrial pressure of 8 mmHg. IAS/Shunts: No atrial level shunt detected by color flow Doppler.  LEFT VENTRICLE PLAX 2D LVIDd:         4.00 cm   Diastology LVIDs:         2.80 cm   LV e' medial:    4.33 cm/s LV PW:         1.10 cm   LV E/e' medial:  18.4 LV IVS:        1.10 cm   LV e' lateral:   7.90 cm/s LVOT diam:     1.80 cm   LV E/e'  lateral: 10.1 LV SV:         65 LV SV Index:   35 LVOT Area:     2.54 cm  RIGHT VENTRICLE             IVC RV Basal diam:  3.50 cm     IVC diam: 2.20 cm RV Mid diam:    3.00 cm RV S prime:     13.00 cm/s TAPSE (M-mode): 3.1 cm LEFT ATRIUM             Index        RIGHT ATRIUM           Index LA diam:        3.30 cm 1.77 cm/m   RA Area:     13.90 cm LA Vol (A2C):   45.2 ml 24.28 ml/m  RA Volume:   35.30 ml  18.96 ml/m LA Vol (A4C):   54.3 ml 29.16 ml/m LA Biplane Vol: 52.8 ml 28.36 ml/m  AORTIC VALVE                    PULMONIC VALVE AV  Area (Vmax):    2.14 cm     PV Vmax:          0.88 m/s AV Area (Vmean):   2.06 cm     PV Peak grad:     3.1 mmHg AV Area (VTI):     2.08 cm     PR End Diast Vel: 7.73 msec AV Vmax:           144.00 cm/s AV Vmean:          97.900 cm/s AV VTI:            0.312 m AV Peak Grad:      8.3 mmHg AV Mean Grad:      4.0 mmHg LVOT Vmax:         121.00 cm/s LVOT Vmean:        79.200 cm/s LVOT VTI:          0.255 m LVOT/AV VTI ratio: 0.82  AORTA Ao Root diam: 2.90 cm Ao Asc diam:  3.80 cm MITRAL VALVE MV Area (PHT): 3.74 cm    SHUNTS MV Decel Time: 203 msec    Systemic VTI:  0.26 m MV E velocity: 79.50 cm/s  Systemic Diam: 1.80 cm MV A velocity: 70.20 cm/s MV E/A ratio:  1.13 [name] MD Electronically signed by [name] MD Signature Date/Time: 07/16/2022/11:33:41 AM    Final    CT Angio Chest PE W and/or Wo Contrast  Result Date: 07/15/2022 CLINICAL DATA:  left sided chest pain, h/o breast cancer and PE EXAM: CT ANGIOGRAPHY CHEST WITH CONTRAST TECHNIQUE: Multidetector CT imaging of the chest was performed using the standard protocol during bolus administration of intravenous contrast. Multiplanar CT image reconstructions and MIPs were obtained to evaluate the vascular anatomy. RADIATION DOSE REDUCTION: This exam was performed according to the departmental dose-optimization program which includes automated exposure control, adjustment of the mA and/or kV according to patient  size and/or use of iterative reconstruction technique. CONTRAST:  73mL OMNIPAQUE IOHEXOL 350 MG/ML SOLN COMPARISON:  03/23/2022 FINDINGS: Cardiovascular: Adequate opacification of the pulmonary arterial tree. No intraluminal filling defect identified to suggest acute pulmonary embolism. Central pulmonary arteries are of normal caliber. No significant coronary artery calcification. Cardiac size within normal limits. No pericardial effusion. Mild atherosclerotic calcification within the thoracic aorta. No aortic aneurysm. 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 Stable 4.2 cm right thyroid mass, previously biopsied in 10/2020. No follow-up imaging is recommended for this lesion. No pathologic mediastinal, hilar, or axillary adenopathy. The esophagus is unremarkable. Lungs/Pleura: 6 mm pulmonary nodule, subpleural right upper lobe, axial image # 38/7, new since prior examination. 3 mm noncalcified right upper lobe pulmonary nodule, axial image # 51/7, new since prior examination. Mild bibasilar atelectasis. Lungs are otherwise clear. No pneumothorax or pleural effusion. No central obstructing lesion. Upper Abdomen: No acute abnormality. Musculoskeletal: Interval development of marked dermal thickening and infiltration of the right breast possibly reflecting post radiation changes. Surgical clips related to right breast lumpectomy noted. No acute bone abnormality. Stable bone island within the T2 vertebral body. No suspicious lytic or blastic bone lesions are seen. Review of the MIP images confirms the above findings. IMPRESSION: 1. No pulmonary embolism. No acute intrathoracic pathology identified. 2. Interval development of marked dermal thickening and infiltration of the right breast possibly reflecting post radiation changes. Surgical clips related to right breast lumpectomy noted. 3. New pulmonary nodules within the  subpleural right upper lobe may relate to treatment changes within the right breast, but are indeterminate. Close attention on surveillance CT imaging is recommended. 4. 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 Stable 4.2 cm right thyroid mass, previously biopsied in 10/2020. No follow-up imaging is recommended for these lesions. Aortic Atherosclerosis (ICD10-I70.0). Electronically Signed   By: Fidela Salisbury M.D.   On: 07/15/2022 00:51   DG Chest 2 View  Result Date: 07/14/2022 CLINICAL DATA:  Chest pain EXAM: CHEST - 2 VIEW COMPARISON:  01/06/2022 FINDINGS: Mild cardiomegaly with central congestion. No focal airspace disease, pleural effusion or pneumothorax. IMPRESSION: Mild cardiomegaly with central vascular congestion Electronically Signed   By: Donavan Foil M.D.   On: 07/14/2022 23:31    Microbiology: Results for orders placed or performed during the hospital encounter of 07/14/22  Resp panel by RT-PCR (RSV, Flu A&B, Covid) Anterior Nasal Swab     Status: None   Collection Time: 07/15/22 12:25 AM   Specimen: Anterior Nasal Swab  Result Value Ref Range Status   SARS Coronavirus 2 by RT PCR NEGATIVE NEGATIVE Final   Influenza A by PCR NEGATIVE NEGATIVE Final   Influenza B by PCR NEGATIVE NEGATIVE Final    Comment: (NOTE) The Xpert Xpress SARS-CoV-2/FLU/RSV plus assay is intended as an aid in the diagnosis of influenza from Nasopharyngeal swab specimens and should not be used as a sole basis for treatment. Nasal washings and aspirates are unacceptable for Xpert Xpress SARS-CoV-2/FLU/RSV testing.  Fact Sheet for Patients: EntrepreneurPulse.com.au  Fact Sheet for Healthcare Providers: IncredibleEmployment.be  This test is not yet approved or cleared by the [place] FDA and has been authorized for detection and/or diagnosis of SARS-CoV-2 by FDA under an Emergency Use Authorization (EUA). This EUA  will remain in effect (meaning this test can be used) for the duration of the COVID-19 declaration under Section 564(b)(1) of the Act, 21 U.S.C. section 360bbb-3(b)(1), unless the authorization is terminated or revoked.     Resp Syncytial Virus by PCR NEGATIVE NEGATIVE Final    Comment: (NOTE) Fact Sheet for Patients: EntrepreneurPulse.com.au  Fact Sheet for Healthcare Providers: IncredibleEmployment.be  This test is not yet approved or cleared by the [place] FDA and has been authorized for detection and/or diagnosis of SARS-CoV-2 by FDA under an Emergency Use Authorization (EUA). This EUA will remain in effect (meaning this test can be used) for the duration of the COVID-19 declaration under Section 564(b)(1) of the Act, 21 U.S.C. section 360bbb-3(b)(1), unless the authorization is terminated or revoked.  Performed at [facility], [place] [address] [place],  [address]    *Note: Due to a large number of results and/or encounters for the requested time period, some results have not been displayed. A complete set of results can be found in Results Review.   Labs: CBC: Recent Labs  Lab 07/14/22 2257 07/15/22 0541 07/16/22 0145  WBC 3.8* 3.7* 3.7*  NEUTROABS 1.4*  --  1.3*  HGB 13.0 12.8 13.1  HCT 41.9 39.6 41.3  MCV 87.8 85.5 87.5  PLT 169 164 [id number]   Basic Metabolic Panel: Recent Labs  Lab 07/14/22 2257 07/15/22 0541 07/16/22 0145  NA 143 139 140  K 3.5 3.7 4.2  CL 106 106 107  CO2 24 24 22   GLUCOSE 121* 146* 127*  BUN 11 11 14   CREATININE 1.05* 1.05* 0.97  CALCIUM 9.2 8.9 8.9  MG  --  1.9 1.9  PHOS  --   --  4.1   Liver Function Tests: Recent Labs  Lab 07/14/22 2257 07/16/22 0145  AST 21 21  ALT 27 26  ALKPHOS 61 51  BILITOT 0.6 0.2*  PROT 6.4* 6.1*  ALBUMIN 3.5 3.2*   CBG: Recent Labs  Lab 07/15/22 1140 07/15/22 1610 07/15/22 2120 07/16/22 0604 07/16/22 1207  GLUCAP 143* 147* 147* 131*  163*   Discharge time spent: greater than 30 minutes.  Signed: Raiford Noble, DO [facility] 07/17/2022

## 2022-07-19 ENCOUNTER — Other Ambulatory Visit (INDEPENDENT_AMBULATORY_CARE_PROVIDER_SITE_OTHER): Payer: Self-pay | Admitting: Family Medicine

## 2022-07-19 DIAGNOSIS — E559 Vitamin D deficiency, unspecified: Secondary | ICD-10-CM

## 2022-07-23 ENCOUNTER — Ambulatory Visit
Admission: EM | Admit: 2022-07-23 | Discharge: 2022-07-23 | Disposition: A | Discharge status: Home / Self Care | Payer: 59 | Attending: Physician Assistant | Admitting: Physician Assistant

## 2022-07-23 ENCOUNTER — Encounter: Payer: Self-pay | Admitting: Emergency Medicine

## 2022-07-23 ENCOUNTER — Other Ambulatory Visit: Payer: Self-pay

## 2022-07-23 ENCOUNTER — Ambulatory Visit (INDEPENDENT_AMBULATORY_CARE_PROVIDER_SITE_OTHER): Payer: 59

## 2022-07-23 DIAGNOSIS — R059 Cough, unspecified: Secondary | ICD-10-CM | POA: Diagnosis not present

## 2022-07-23 DIAGNOSIS — J209 Acute bronchitis, unspecified: Secondary | ICD-10-CM

## 2022-07-23 MED ORDER — DOXYCYCLINE HYCLATE 100 MG PO CAPS: 100.0000 mg | ORAL_CAPSULE | Freq: Two times a day (BID) | ORAL | 0 refills | Status: DC

## 2022-07-23 NOTE — ED Triage Notes (Signed)
Pt here for cough and some blood tinged sputum with body aches x 3 days

## 2022-07-23 NOTE — Discharge Instructions (Addendum)
Return if any problems.

## 2022-07-24 ENCOUNTER — Inpatient Hospital Stay: Admission: RE | Admit: 2022-07-24 | Payer: 59 | Source: Ambulatory Visit

## 2022-07-24 ENCOUNTER — Ambulatory Visit
Admission: RE | Admit: 2022-07-24 | Discharge: 2022-07-24 | Disposition: A | Discharge status: Home / Self Care | Payer: 59 | Source: Ambulatory Visit | Attending: Hematology and Oncology | Admitting: Hematology and Oncology

## 2022-07-24 DIAGNOSIS — Z171 Estrogen receptor negative status [ER-]: Secondary | ICD-10-CM

## 2022-07-24 NOTE — ED Provider Notes (Signed)
[name]    CSN: [id number] Arrival date & time: 07/23/22  1309      History   Chief Complaint Chief Complaint  Patient presents with   Cough    HPI [name] is a 62 y.o. female.   Patient complains of a cough productive of sputum with blood patient reports the cough started 3 days ago  The history is provided by the patient.  Cough Cough characteristics:  Productive Sputum characteristics:  Nondescript Chronicity:  New Smoker: no     Past Medical History:  Diagnosis Date   Allergy    Anginal pain    a. NL cath in 2008;  b. Myoview 03/2011: dec uptake along mid anterior wall on stress imaging -> ? attenuation vs. ischemia, EF 65%;  c. Echo 04/2011: EF 55-60%, no RWMA, Gr 2 dd   Anxiety    Arthritis    Asthma    Back pain    Bone cancer    Cancer    Chest pain    CHF (congestive heart failure)    Clotting disorder    Constipation    Depression    Diabetes mellitus    Drug use    Dyspnea    Frequent urination    GERD (gastroesophageal reflux disease)    Glaucoma    History of stomach ulcers    HLD (hyperlipidemia)    Hypertension    IBS (irritable bowel syndrome)    Joint pain    Joint pain    Lactose intolerance    Leg edema    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 benign thymoma   Neuromuscular disorder    Obesity    Palpitations    Pneumonia 05/2016   double   Pulmonary edema    Pulmonary embolism    a. 2008 -> coumadin x 6 mos.   Rheumatoid arthritis    Sleep apnea    on CPAP 02/2018   SOB (shortness of breath)    Thyroid disease    TIA (transient ischemic attack)    Urinary urgency    Vitamin D deficiency     Patient Active Problem List   Diagnosis Date Noted   Lung nodules 07/15/2022   Heart failure 03/25/2022   Chest pain 03/24/2022   Multiple thyroid nodules 11/28/2021   Genetic testing 11/22/2021   Family history of breast cancer 11/03/2021   Family history of colon cancer 11/03/2021   Malignant  neoplasm of upper-outer quadrant of female breast 11/02/2021   Back pain 06/01/2021   Right thyroid nodule 05/31/2021   At risk for impaired metabolic function [id number]   Unilateral primary osteoarthritis, left hip    Hip arthritis 06/11/2018   Class 3 severe obesity with serious comorbidity and body mass index (BMI) of 40.0 to 44.9 in adult 05/29/2018   Other fatigue 11/20/2017   Shortness of breath on exertion 11/20/2017   Type 2 diabetes mellitus with hyperlipidemia 11/20/2017   Vitamin D deficiency 11/20/2017   Depression with anxiety 11/20/2017   Other hyperlipidemia 11/20/2017   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/04/2016   Presence of left artificial knee joint 09/20/2016   Arthritis of knee 08/22/2016   Cervical radiculopathy 07/26/2016   Cervical disc disorder with radiculopathy 07/26/2016   Right arm pain 06/29/2016   Cervicalgia 06/29/2016   Primary osteoarthritis of left knee 06/29/2016   Hypersomnia with sleep apnea 11/18/2015   Lethargy 11/18/2015   Exertional chest pain 09/30/2014   Abnormal  cardiac function test 09/29/2014   Chest pain with moderate risk for cardiac etiology 09/28/2014   HTN (hypertension) 09/28/2014   AKI (acute kidney injury)    Paresthesia 06/18/2014   Numbness and tingling of left arm and leg    Unstable angina 05/24/2014   OSA on CPAP 05/24/2014   CAD (coronary artery disease) 11/25/2012   S/P laparoscopic appendectomy 06/04/2012   Acute on chronic diastolic CHF (congestive heart failure) 01/09/2012   Mediastinal mass 01/09/2012   SOB (shortness of breath) 06/20/2011   Mediastinal abnormality 06/20/2011   Dyslipidemia 12/23/2009   GLAUCOMA 12/23/2009   ARTHRITIS 12/23/2009   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   Class 2 severe obesity with serious comorbidity and body mass index (BMI) of 38.0 to 38.9 in adult 09/13/2006   ANXIETY STATE NOS 09/13/2006   DISORDER, DEPRESSIVE NEC 09/13/2006   CARPAL TUNNEL SYNDROME, MILD 09/13/2006   Essential  hypertension 09/13/2006   IBS 09/13/2006   DEGENERATION, LUMBAR/LUMBOSACRAL DISC 09/13/2006   SYMPTOM, SWELLING/MASS/LUMP IN CHEST 09/13/2006   PULMONARY EMBOLISM, HX OF 09/13/2006    Past Surgical History:  Procedure Laterality Date   ABDOMINAL HYSTERECTOMY  2005   APPENDECTOMY     BREAST LUMPECTOMY WITH RADIOACTIVE SEED AND SENTINEL LYMPH NODE BIOPSY Right 11/16/2021   Procedure: RIGHT BREAST RADIOACTIVE SEED LOCALIZED LUMPECTOMY WITH RIGHT AXILLARY SENTINEL LYMPH NODE BIOPSY;  Surgeon: Donnie Mesa, MD;  Location: [place];  Service: General;  Laterality: Right;  LMA & PEC BLOCK   CARDIAC CATHETERIZATION     Normal   CARDIAC CATHETERIZATION N/A 09/30/2014   Procedure: Left Heart Cath and Coronary Angiography;  Surgeon: Sherren Mocha, MD;  Location: [place] [facility];  Service: Cardiovascular;  Laterality: N/A;   LAPAROSCOPIC APPENDECTOMY N/A 06/03/2012   Procedure: APPENDECTOMY LAPAROSCOPIC;  Surgeon: Stark Klein, MD;  Location: [place];  Service: General;  Laterality: N/A;   Left knee surgery  2008   LEG SURGERY     TONSILLECTOMY     TOTAL HIP ARTHROPLASTY Left 06/11/2018   Procedure: LEFT TOTAL HIP ARTHROPLASTY ANTERIOR APPROACH;  Surgeon: Meredith Pel, MD;  Location: [place];  Service: Orthopedics;  Laterality: Left;   TOTAL KNEE ARTHROPLASTY Left 08/22/2016   Procedure: TOTAL KNEE ARTHROPLASTY;  Surgeon: Meredith Pel, MD;  Location: [place];  Service: Orthopedics;  Laterality: Left;   TUBAL LIGATION  1989    OB History     Gravida  4   Para  3   Term  3   Preterm  0   AB  1   Living  3      SAB  1   IAB  0   Ectopic  0   Multiple  0   Live Births  3            Home Medications    Prior to Admission medications   Medication Sig Start Date End Date Taking? Authorizing Provider  doxycycline (VIBRAMYCIN) 100 MG capsule Take 1 capsule (100 mg total) by mouth 2 (two) times daily. 07/23/22  Yes Fransico [name], PA-C  ACCU-CHEK  GUIDE test strip  10/28/21   [provider]  acetaminophen (TYLENOL) 500 MG tablet Take 1,000 mg by mouth 2 (two) times daily as needed for moderate pain.    [provider]  albuterol (PROVENTIL HFA;VENTOLIN HFA) 108 (90 Base) MCG/ACT inhaler Inhale 2 puffs into the lungs every 6 (six) hours as needed for wheezing or shortness of breath.    [provider]  aspirin 81 MG chewable tablet Chew 81 mg by mouth daily.    [provider]  atorvastatin (LIPITOR) 40 MG tablet Take 40 mg by mouth at bedtime.     [provider]  busPIRone (BUSPAR) 30 MG tablet Take 30 mg by mouth 2 (two) times daily. 12/18/19   [provider]  carvedilol (COREG) 6.25 MG tablet Take 1 tablet (6.25 mg total) by mouth 2 (two) times daily. 09/06/21   Josue Hector, MD  diclofenac Sodium (VOLTAREN) 1 % GEL Apply 2 g topically 4 (four) times daily as needed (joint/muscle pain). 09/20/19   [provider]  empagliflozin (JARDIANCE) 10 MG TABS tablet Take 1 tablet (10 mg total) by mouth daily. 03/28/22   Arrien, Jimmy Picket, MD  EPINEPHrine 0.3 mg/0.3 mL IJ SOAJ injection Inject 0.3 mg into the muscle as needed for anaphylaxis. Follow package instructions as needed for severe allergy or anaphylactic reaction. 11/06/21   Carrie Mew, MD  furosemide (LASIX) 40 MG tablet Take 1 tablet (40 mg total) by mouth daily as needed for edema or fluid (take in case of weight gain 2 to 3 lbs in 24 hrs or 5 lbs in 7 days.). 03/28/22 07/15/22  Arrien, Jimmy Picket, MD  hydrOXYzine (VISTARIL) 25 MG capsule Take 25 mg by mouth 3 (three) times daily as needed for itching.    [provider]  lidocaine (LIDODERM) 5 % Place 1 patch onto the skin daily as needed for pain. 02/11/21   [provider]  lidocaine-prilocaine (EMLA) cream Apply to affected area once Patient taking differently: Apply 1 Application topically once. Apply to affected area once 12/20/21   [name] Pike, MD  loperamide (IMODIUM) 2 MG capsule Take 1 capsule (2 mg total) by mouth 4 (four) times daily as needed for diarrhea or loose stools. 01/07/22   Long, Wonda Olds, MD  losartan (COZAAR) 25 MG tablet Take 1 tablet (25 mg total) by mouth daily. 03/28/22 07/15/22  Arrien, Jimmy Picket, MD  metFORMIN (GLUCOPHAGE) 500 MG tablet Take 1 tablet (500 mg total) by mouth daily with breakfast. 12/19/21   Laqueta Linden, MD  nitroGLYCERIN (NITROSTAT) 0.4 MG SL tablet Place 1 tablet (0.4 mg total) under the tongue every 5 (five) minutes as needed for chest pain. 04/11/22   Elgie Collard, PA-C  nystatin (MYCOSTATIN/NYSTOP) powder Apply 1 application topically 3 (three) times daily as needed for rash.    [provider]  ondansetron (ZOFRAN-ODT) 4 MG disintegrating tablet Take 1 tablet (4 mg total) by mouth every 8 (eight) hours as needed for nausea or vomiting. 01/07/22   Long, Wonda Olds, MD  oxyCODONE (OXY IR/ROXICODONE) 5 MG immediate release tablet Take 1 tablet (5 mg total) by mouth every 6 (six) hours as needed for severe pain. 04/07/22   Kyung Rudd, MD  pantoprazole (PROTONIX) 40 MG tablet Take 1 tablet (40 mg total) by mouth 2 (two) times daily. Patient taking differently: Take 40 mg by mouth daily. 03/09/21   Domenic Polite, MD  prochlorperazine (COMPAZINE) 10 MG tablet Take 1 tablet (10 mg total) by mouth every 6 (six) hours as needed for nausea or vomiting. 12/20/21   [name] Pike, MD  sertraline (ZOLOFT) 25 MG tablet Take 1 tablet (25 mg total) by mouth daily. 01/11/22   Laqueta Linden, MD  spironolactone (ALDACTONE) 25 MG tablet Take 1/2 tablet (12.5 mg total) by mouth daily. Patient taking differently: Take 25 mg by mouth every other day. 03/28/22 07/15/22  Arrien, Jimmy Picket, MD  Vitamin D, Ergocalciferol, (DRISDOL) 1.25 MG (50000 UNIT) CAPS capsule Take 1 capsule (50,000 Units total) by mouth every 7 (seven) days. Patient not taking: Reported on 07/15/2022 01/11/22    Laqueta Linden, MD    Family History Family History  Problem Relation Age of Onset   Emphysema Mother    Arthritis Mother    Heart failure Mother        alive @ 55   Stroke Mother    Diabetes Mother    Hypertension Mother    Hyperlipidemia Mother    Depression Mother    Anxiety disorder Mother    Heart disease Mother    Alcoholism Mother    Depression Father    Heart disease Father        died @ 32's.   Stroke Father    Diabetes Father    Hyperlipidemia Father    Hypertension Father    Bipolar disorder Father    Sleep apnea Father    Alcoholism Father    Drug abuse Father    Sudden death Father    Diabetes Sister    Asthma Brother    Breast cancer Maternal Aunt 41 - 10   Colon cancer Maternal Grandfather        dx after 42   Heart disease Paternal Grandfather    Breast cancer Cousin        dx 68s   Esophageal cancer Neg Hx    Stomach cancer Neg Hx    Rectal cancer Neg Hx     Social History Social History   Tobacco Use   Smoking status: Former    Packs/day: 0.50    Years: 15.00    Additional pack years: 0.00    Total pack years: 7.50    Types: Cigarettes    Quit date: 04/24/1985    Years since quitting: 37.2   Smokeless tobacco: Never  Vaping Use   Vaping Use: Never used  Substance Use Topics   Alcohol use: No    Alcohol/week: 0.0 standard drinks of alcohol   Drug use: Not Currently    Types: Marijuana     Allergies   Hydrocodone-acetaminophen, Penicillins, Shellfish allergy, Latex, and Sulfa antibiotics   Review of Systems Review of Systems  Respiratory:  Positive for cough.   All other systems reviewed and are negative.    Physical Exam Triage Vital Signs ED Triage Vitals  Enc Vitals Group     BP 07/23/22 1321 135/72     Pulse Rate 07/23/22 1321 81     Resp 07/23/22 1321 18     Temp 07/23/22 1321 98 F (36.7 C)     Temp Source 07/23/22 1321 Oral     SpO2 07/23/22 1321 95 %     Weight --      Height --      Head  Circumference --      Peak Flow --      Pain Score 07/23/22 1322 3     Pain Loc --      Pain Edu? --      Excl. in [place]? --    No data found.  Updated Vital Signs BP 135/72 (BP Location: Left Arm)   Pulse 81   Temp 98 F (36.7 C) (Oral)   Resp 18   SpO2 95%   Visual Acuity Right Eye Distance:   Left Eye Distance:   Bilateral Distance:    Right Eye Near:   Left  Eye Near:    Bilateral Near:     Physical Exam Vitals and nursing note reviewed.  Constitutional:      General: She is not in acute distress.    Appearance: She is well-developed.  HENT:     Head: Normocephalic and atraumatic.  Eyes:     Conjunctiva/sclera: Conjunctivae normal.  Cardiovascular:     Rate and Rhythm: Normal rate and regular rhythm.     Heart sounds: No murmur heard. Pulmonary:     Effort: Pulmonary effort is normal. No respiratory distress.     Breath sounds: Normal breath sounds.  Abdominal:     Palpations: Abdomen is soft.     Tenderness: There is no abdominal tenderness.  Musculoskeletal:        General: No swelling.     Cervical back: Neck supple.  Skin:    General: Skin is warm and dry.     Capillary Refill: Capillary refill takes less than 2 seconds.  Neurological:     Mental Status: She is alert.  Psychiatric:        Mood and Affect: Mood normal.      UC Treatments / Results  Labs (all labs ordered are listed, but only abnormal results are displayed) Labs Reviewed - No data to display  EKG   Radiology MM DIAG BREAST TOMO BILATERAL  Result Date: 07/24/2022 CLINICAL DATA:  62 year old female with focal thickening and tenderness in the RIGHT breast and RIGHT axilla discovered on self-examination. Annual follow-up with history of RIGHT breast cancer, lumpectomy, radiation and chemotherapy in 2023. RIGHT axillary sentinel lymph node was negative. EXAM: DIGITAL DIAGNOSTIC BILATERAL MAMMOGRAM WITH TOMOSYNTHESIS; ULTRASOUND RIGHT BREAST LIMITED TECHNIQUE: Bilateral digital  diagnostic mammography and breast tomosynthesis was performed.; Targeted ultrasound examination of the right breast was performed COMPARISON:  Previous exam(s). ACR Breast Density Category b: There are scattered areas of fibroglandular density. FINDINGS: 2D and 3D full field views of both breasts and a magnification view of the lumpectomy site demonstrate no suspicious mass, nonsurgical distortion or worrisome calcifications. Surgical changes in the UPPER RIGHT breast and RIGHT axilla noted. Radiation treatment changes in the RIGHT breast are present. On physical exam, focal thickening in the RIGHT axilla is noted underlying the surgical scar. Targeted ultrasound is performed, showing no suspicious sonographic abnormalities within the [facility] RIGHT breast. Scar tissue extending to the skin is noted in the RIGHT axilla at the site of patient concern. IMPRESSION: 1. No suspicious abnormalities to suggest breast malignancy. 2. Scar tissue in the RIGHT axilla corresponding to the area of patient concern/thickening. 3. RIGHT lumpectomy and radiation changes. RECOMMENDATION: Bilateral diagnostic mammogram in 1 year. I have discussed the findings and recommendations with the patient. If applicable, a reminder letter will be sent to the patient regarding the next appointment. BI-RADS CATEGORY  2: Benign. Electronically Signed   By: Margarette Canada M.D.   On: 07/24/2022 13:48   [name] LIMITED ULTRASOUND INCLUDING AXILLA RIGHT BREAST  Result Date: 07/24/2022 CLINICAL DATA:  62 year old female with focal thickening and tenderness in the RIGHT breast and RIGHT axilla discovered on self-examination. Annual follow-up with history of RIGHT breast cancer, lumpectomy, radiation and chemotherapy in 2023. RIGHT axillary sentinel lymph node was negative. EXAM: DIGITAL DIAGNOSTIC BILATERAL MAMMOGRAM WITH TOMOSYNTHESIS; ULTRASOUND RIGHT BREAST LIMITED TECHNIQUE: Bilateral digital diagnostic mammography and breast tomosynthesis was performed.;  Targeted ultrasound examination of the right breast was performed COMPARISON:  Previous exam(s). ACR Breast Density Category b: There are scattered areas of fibroglandular density. FINDINGS: 2D  and 3D full field views of both breasts and a magnification view of the lumpectomy site demonstrate no suspicious mass, nonsurgical distortion or worrisome calcifications. Surgical changes in the UPPER RIGHT breast and RIGHT axilla noted. Radiation treatment changes in the RIGHT breast are present. On physical exam, focal thickening in the RIGHT axilla is noted underlying the surgical scar. Targeted ultrasound is performed, showing no suspicious sonographic abnormalities within the [facility] RIGHT breast. Scar tissue extending to the skin is noted in the RIGHT axilla at the site of patient concern. IMPRESSION: 1. No suspicious abnormalities to suggest breast malignancy. 2. Scar tissue in the RIGHT axilla corresponding to the area of patient concern/thickening. 3. RIGHT lumpectomy and radiation changes. RECOMMENDATION: Bilateral diagnostic mammogram in 1 year. I have discussed the findings and recommendations with the patient. If applicable, a reminder letter will be sent to the patient regarding the next appointment. BI-RADS CATEGORY  2: Benign. Electronically Signed   By: Margarette Canada M.D.   On: 07/24/2022 13:48   DG Chest 2 View  Result Date: 07/23/2022 CLINICAL DATA:  Cough EXAM: CHEST - 2 VIEW COMPARISON:  07/14/2022 FINDINGS: Both views are mildly degraded by patient body habitus. Midline trachea. Borderline cardiomegaly. No pleural effusion or pneumothorax. Vague increased density projecting over the lung bases on the frontal is favored to be due to overlying breast tissue. No lobar consolidation. Surgical clips project over the mid right chest on the frontal well localized on the lateral. Likely related to the EMR history of right-sided lumpectomy. IMPRESSION: Mild limitations as detailed above. No acute cardiopulmonary  disease. Electronically Signed   By: Abigail Miyamoto M.D.   On: 07/23/2022 14:00    Procedures Procedures (including critical care time)  Medications Ordered in UC Medications - No data to display  Initial Impression / Assessment and Plan / UC Course  I have reviewed the triage vital signs and the nursing notes.  Pertinent labs & imaging results that were available during my care of the patient were reviewed by me and considered in my medical decision making (see chart for details).      Final Clinical Impressions(s) / UC Diagnoses   Final diagnoses:  Acute bronchitis, unspecified organism     Discharge Instructions      Return if any problems.    ED Prescriptions     Medication Sig Dispense Auth. Provider   doxycycline (VIBRAMYCIN) 100 MG capsule Take 1 capsule (100 mg total) by mouth 2 (two) times daily. 20 capsule Fransico [name], [place]      PDMP not reviewed this encounter. An After Visit Summary was printed and given to the patient.       Fransico [name], [place] 07/24/22 1818

## 2022-07-25 ENCOUNTER — Other Ambulatory Visit: Payer: Self-pay

## 2022-07-26 ENCOUNTER — Ambulatory Visit: Payer: 59 | Attending: Hematology and Oncology

## 2022-07-26 DIAGNOSIS — R293 Abnormal posture: Secondary | ICD-10-CM | POA: Diagnosis present

## 2022-07-26 DIAGNOSIS — I890 Lymphedema, not elsewhere classified: Secondary | ICD-10-CM | POA: Insufficient documentation

## 2022-07-26 DIAGNOSIS — M25611 Stiffness of right shoulder, not elsewhere classified: Secondary | ICD-10-CM | POA: Diagnosis present

## 2022-07-26 DIAGNOSIS — Z483 Aftercare following surgery for neoplasm: Secondary | ICD-10-CM | POA: Diagnosis present

## 2022-07-26 DIAGNOSIS — Z171 Estrogen receptor negative status [ER-]: Secondary | ICD-10-CM | POA: Insufficient documentation

## 2022-07-26 DIAGNOSIS — C50411 Malignant neoplasm of upper-outer quadrant of right female breast: Secondary | ICD-10-CM | POA: Diagnosis present

## 2022-07-26 NOTE — Therapy (Signed)
OUTPATIENT PHYSICAL THERAPY BREAST CANCER RE-EVAL   Patient Name: [name] MRN: [id number] DOB:[birth year], 62 y.o., female Today's Date: 07/26/2022   PT End of Session - 07/26/22 0903     Visit Number 10    Number of Visits 13    Date for PT Re-Evaluation 08/23/22    PT Start Time 0904    PT Stop Time 0955    PT Time Calculation (min) 51 min    Activity Tolerance Patient tolerated treatment well    Behavior During Therapy [facility] for tasks assessed/performed              Past Medical History:  Diagnosis Date   Allergy    Anginal pain    a. NL cath in 2008;  b. Myoview 03/2011: dec uptake along mid anterior wall on stress imaging -> ? attenuation vs. ischemia, EF 65%;  c. Echo 04/2011: EF 55-60%, no RWMA, Gr 2 dd   Anxiety    Arthritis    Asthma    Back pain    Bone cancer    Cancer    Chest pain    CHF (congestive heart failure)    Clotting disorder    Constipation    Depression    Diabetes mellitus    Drug use    Dyspnea    Frequent urination    GERD (gastroesophageal reflux disease)    Glaucoma    History of stomach ulcers    HLD (hyperlipidemia)    Hypertension    IBS (irritable bowel syndrome)    Joint pain    Joint pain    Lactose intolerance    Leg edema    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 benign thymoma   Neuromuscular disorder    Obesity    Palpitations    Pneumonia 05/2016   double   Pulmonary edema    Pulmonary embolism    a. 2008 -> coumadin x 6 mos.   Rheumatoid arthritis    Sleep apnea    on CPAP 02/2018   SOB (shortness of breath)    Thyroid disease    TIA (transient ischemic attack)    Urinary urgency    Vitamin D deficiency    Past Surgical History:  Procedure Laterality Date   ABDOMINAL HYSTERECTOMY  2005   APPENDECTOMY     BREAST LUMPECTOMY WITH RADIOACTIVE SEED AND SENTINEL LYMPH NODE BIOPSY Right 11/16/2021   Procedure: RIGHT BREAST RADIOACTIVE SEED LOCALIZED LUMPECTOMY WITH RIGHT AXILLARY SENTINEL LYMPH NODE  BIOPSY;  Surgeon: Donnie Mesa, MD;  Location: [place];  Service: General;  Laterality: Right;  LMA & PEC BLOCK   CARDIAC CATHETERIZATION     Normal   CARDIAC CATHETERIZATION N/A 09/30/2014   Procedure: Left Heart Cath and Coronary Angiography;  Surgeon: Sherren Mocha, MD;  Location: [place] [facility];  Service: Cardiovascular;  Laterality: N/A;   LAPAROSCOPIC APPENDECTOMY N/A 06/03/2012   Procedure: APPENDECTOMY LAPAROSCOPIC;  Surgeon: Stark Klein, MD;  Location: [place];  Service: General;  Laterality: N/A;   Left knee surgery  2008   LEG SURGERY     TONSILLECTOMY     TOTAL HIP ARTHROPLASTY Left 06/11/2018   Procedure: LEFT TOTAL HIP ARTHROPLASTY ANTERIOR APPROACH;  Surgeon: Meredith Pel, MD;  Location: [place];  Service: Orthopedics;  Laterality: Left;   TOTAL KNEE ARTHROPLASTY Left 08/22/2016   Procedure: TOTAL KNEE ARTHROPLASTY;  Surgeon: Meredith Pel, MD;  Location: [place];  Service: Orthopedics;  Laterality: Left;   TUBAL LIGATION  1989   Patient Active Problem List   Diagnosis Date Noted   Lung nodules 07/15/2022   Heart failure 03/25/2022   Chest pain 03/24/2022   Multiple thyroid nodules 11/28/2021   Genetic testing 11/22/2021   Family history of breast cancer 11/03/2021   Family history of colon cancer 11/03/2021   Malignant neoplasm of upper-outer quadrant of female breast 11/02/2021   Back pain 06/01/2021   Right thyroid nodule 05/31/2021   At risk for impaired metabolic function [id number]   Unilateral primary osteoarthritis, left hip    Hip arthritis 06/11/2018   Class 3 severe obesity with serious comorbidity and body mass index (BMI) of 40.0 to 44.9 in adult 05/29/2018   Other fatigue 11/20/2017   Shortness of breath on exertion 11/20/2017   Type 2 diabetes mellitus with hyperlipidemia 11/20/2017   Vitamin D deficiency 11/20/2017   Depression with anxiety 11/20/2017   Other hyperlipidemia 11/20/2017   S/P total knee replacement  10/04/2016   Presence of left artificial knee joint 09/20/2016   Arthritis of knee 08/22/2016   Cervical radiculopathy 07/26/2016   Cervical disc disorder with radiculopathy 07/26/2016   Right arm pain 06/29/2016   Cervicalgia 06/29/2016   Primary osteoarthritis of left knee 06/29/2016   Hypersomnia with sleep apnea 11/18/2015   Lethargy 11/18/2015   Exertional chest pain 09/30/2014   Abnormal cardiac function test 09/29/2014   Chest pain with moderate risk for cardiac etiology 09/28/2014   HTN (hypertension) 09/28/2014   AKI (acute kidney injury)    Paresthesia 06/18/2014   Numbness and tingling of left arm and leg    Unstable angina 05/24/2014   OSA on CPAP 05/24/2014   CAD (coronary artery disease) 11/25/2012   S/P laparoscopic appendectomy 06/04/2012   Acute on chronic diastolic CHF (congestive heart failure) 01/09/2012   Mediastinal mass 01/09/2012   SOB (shortness of breath) 06/20/2011   Mediastinal abnormality 06/20/2011   Dyslipidemia 12/23/2009   GLAUCOMA 12/23/2009   ARTHRITIS 12/23/2009   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 FNP   REFERRING PROVIDER: Dr. Georgette Dover   REFERRING DIAG: Rt breast cancer  THERAPY DIAG:  Malignant neoplasm of upper-outer quadrant of right breast in female, estrogen receptor negative  Lymphedema, not elsewhere classified  Aftercare following surgery for neoplasm  Stiffness of right shoulder, not elsewhere classified  Abnormal posture  Rationale for Evaluation and Treatment Rehabilitation  ONSET DATE:  10/28/21  SUBJECTIVE:  SUBJECTIVE STATEMENT:  I believe the chest swelling is improving. I had the MD check the little knot. I had an [name] and that was all good. I got 2 more bras at [facility] [employer] [facility].I have been doing the MLD every day. I got the basic pump this weekend and I have used it several times. I don't see any improvement when I use it. I found a knot in the axilla but it feels like it is in the cording. It stings sometimes in the axilla, but it doesn't feel as thick as it was. I still feel it under the breast but it doesn't bother me much. I am doing the wand exercises.  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 Other hx includes DM, CHF, hx of stroke, anxiety.  She completed Chemotherapy and She finished radiation 05/02/2022.  Had an axillary seroma drained x 4.  She was hospitalized Dec 1st through the 5th for fluid in her legs and lungs, and she had some PT at home after the The new bras are better.hospitalization about 2 times.  PATIENT GOALS:  Reassess how my recovery is going related to arm function, pain, and swelling.  PAIN:  Are you having pain? No not presently, but was having breast pain before the antibiotics  PRECAUTIONS: Recent Surgery, right UE Lymphedema risk, DM, CHF, hx of CVA,anxiety  ACTIVITY LEVEL / LEISURE: reading, TV    OBJECTIVE:   PATIENT SURVEYS:  QUICK DASH:not given  BREAST COMPLAINTS QUESTIONNAIRE( EVAL) Pain:2 Heaviness:6 Swollen feeling:9 Tense Skin:5 Redness:9 Bra Print:6 Size of Pores:8 Hard feeling: 8 Total:    53 /80 A Score over 9 indicates lymphedema issues in the breast 37 today (3/7/20240 BREAST COMPLAINTS QUESTIONNAIRE (06/29/2022 Pain:3 Heaviness:7 Swollen  feeling:7 Tense Skin:4 Redness:2 Bra Print:2 Size of Pores:5 Hard feeling: 2 Total:   32  /80 A Score over 9 indicates lymphedema issues in the breast    OBSERVATIONS: EVAL: Large breasted with noted generalized increase in swelling on the right. Hyperpigmentation from radiation and mild skin sloughing from area in axilla that was burned. 06/29/2022 generalized swelling but improved with enlarged pores still present. Several small cords still present in axilla/upper arm but not interfering with ROM. Mild fibrosis noted mainly at medial breast. Small nodular area noted prox to nipple. Advised pt to have it checked EVAL:Several small cords noted in axilla and several larger cords noted in right breast. Fibrosis noted at proximal lateral nipple and mildly medial breast. Pt has been wearing her compression bra and it does feel better 07/26/2022; Fibrosis still noted greatest at medial breast; peach dot foam made and put in stockinette to place in bra. New bra more comfortable for pt, but rides down and doesn't cover upper breast as well.  POSTURE:  Forward head and rounded shoulders posture   UPPER EXTREMITY AROM/PROM:   A/PROM RIGHT   eval 11/11/2021  01/11/22 05/17/2022 RE-eval RIGHT 06/06/2021 RIGHT 06/29/2022 RIGHT 07/26/2022  Shoulder extension 48 60 55 60  60  Shoulder flexion 132pn 135pn 119/pulls 132 132 132  Shoulder abduction 135pn 135 130/ pulls 130 pulls 134 140  Shoulder internal rotation         Shoulder external rotation 78pn in elbow  80 89/pulls 92                            (Blank rows = not tested)  A/PROM LEFT   eval LEFT 06/06/2022  Shoulder extension 55   Shoulder flexion 153 133  Shoulder abduction 145 137  Shoulder internal rotation     Shoulder external rotation 85                           (Blank rows = not tested)     CERVICAL AROM: All within functional limits:    LYMPHEDEMA ASSESSMENTS:    LANDMARK RIGHT   eval 01/11/22 05/17/2022  10 cm proximal to  olecranon process 36 37 37.5  Olecranon process 29 29.5 29.5  10 cm proximal to ulnar styloid process 23.5 23.9 23.9  Just proximal to ulnar styloid process 17.5 17.4 17.2  Across hand at thumb web space 20.0 19.4 19.7  At base of 2nd digit 7.3 7.0 6.9  (Blank rows = not tested)   LANDMARK LEFT   eval 01/11/22 05/17/2022  10 cm proximal to olecranon process 35.2 36.5 38.2  Olecranon process 30.5 30.5 29.6  10 cm proximal to ulnar styloid process 23.5 23.5 24.6  Just proximal to ulnar styloid process 17 17 17.7  Across hand at thumb web space 20.3 20.5 19.9  At base of 2nd digit 6.9 6.9 6.9  (Blank rows = not tested)  07/26/2022; around chest 110 cm   TREATMENT TODAY   07/26/2022 Wall slides flexion and abd  x 5 prior to measuring Measured right shoulder AROM Assessed right breast;fibrosis still noted especially medially Peach dot foam cut for area of fibrosis and pt assisted with placing it in bra. Pt instructed how to use MLD  to activate LN and upper pathway while using  basic pump to prevent increase in chest swelling.   06/29/2022 Discussed goals and pt filled out Breast Complaints Survey MFR to right axillary region, upper arm,  area of cording, PROM right shoulder flexion, scaption, abd, ER In supine head of table elevated: Short neck, 5 diaphragmatic breaths, L axillary nodes and establishment of interaxillary pathway, R inguinal nodes and establishment of axilloinguinal pathway, then R breast moving fluid towards pathways spending extra time in any areas of fibrosis then retracing all steps. Measured ROM  06/22/2022  MFR to right axillary region, upper arm, forearm area of cording, PROM right shoulder flexion, scaption, abd, ER In supine head of table elevated: Short neck, 5 diaphragmatic breaths, L axillary nodes and establishment of interaxillary pathway, R inguinal nodes and establishment of axilloinguinal pathway, then R breast moving fluid towards pathways spending  extra time in any areas of fibrosis then retracing all steps.   06/13/2022  MFR to right axillary region, upper arm, forearm area of cording, PROM right shoulder flexion, scaption, abd, ER In supine head of table elevated: Short neck, 5 diaphragmatic breaths, L axillary nodes and establishment of interaxillary pathway, R inguinal nodes and establishment of axilloinguinal pathway, then R breast moving fluid towards pathways spending extra time in any areas of fibrosis then retracing all steps.   06/08/2022 MFR to right axillary region, upper arm area of cording, PROM right shoulder flexion, scaption, abd, ER In supine head of table elevated: Short neck, 5 diaphragmatic breaths, L axillary nodes and establishment of interaxillary pathway, R inguinal nodes and establishment of axilloinguinal pathway, then R breast moving fluid towards pathways spending extra time in any areas of fibrosis then retracing all steps. Verbally reviewed with pt while demonstrating and had pt. Practice LN's, pathways and breast. In supine with table elevated and in  sitting. Messaged Lorrin Jackson while pt was here because she is interested in talking with someone, as she has had several losses of close friends recently. Pt Ok'd me messaging Lattie Haw   06/06/2022  MFR to right axillary region, upper arm area of cording, PROM right shoulder flexion, scaption, abd, ER In supine head of table elevated: Short neck, 5 diaphragmatic breaths, L axillary nodes and establishment of interaxillary pathway, R inguinal nodes and establishment of axilloinguinal pathway, then R breast moving fluid towards pathways spending extra time in any areas of fibrosis then retracing all steps. Verbally reviewed with pt while demonstrating. Checked left axillary region at pt request. There does appear to be more tissue on the left possibly due to axillary incision and radiation on the right. She has not had surgery on the left,  05/30/2022 Soft tissue  mobilization to right UT, pectorals and lats with cocoa butter MFR to right axillary region, upper arm area of cording, PROM right shoulder flexion, scaption, abd, ER In supine head of table elevated: Short neck, 5 diaphragmatic breaths, L axillary nodes and establishment of interaxillary pathway, R inguinal nodes and establishment of axilloinguinal pathway, then R breast moving fluid towards pathways spending extra time in any areas of fibrosis then retracing all steps.Pt instructed in and practiced several steps briefly, doing very well overall but having some difficulty when reaching across to the opposite side.     05/25/2022 Foam pad in TG soft made to place under left  lower border of bra to prevent irritation and rolling Supine  scaption x5 with wand MFR to right axillary region, upper arm area of cording PROM right shoulder flexion, scaption, abd, ER In supine head of table elevated: Short neck, 5 diaphragmatic breaths, L axillary nodes and establishment of interaxillary pathway, R inguinal nodes and establishment of axilloinguinal pathway, then R breast moving fluid towards pathways spending extra time in any areas of fibrosis then retracing all steps.Pt instructed in and practiced all steps briefly, doing very well overall but having some difficulty when reaching across to the opposite side. Cording noted under the breast today Pt given written instructions for MLD to right breast    05/23/2022 Foam pad in TG soft made to place under right lower border of bra to prevent irritation and rolling Supine wand flex and scaption x5 MFR to right axillary region, upper arm area of cording PROM right shoulder flexion, scaption, abd, ER In supine head of table elevated: Short neck, 5 diaphragmatic breaths, L axillary nodes and establishment of interaxillary pathway, R inguinal nodes and establishment of axilloinguinal pathway, then R breast moving fluid towards pathways spending extra time in any  areas of fibrosis then retracing all steps.Pt instructed in anatomy and physiology of lymphatic system.    PATIENT EDUCATION: 05/17/2022 (RW) Education details:reviewed 4 post op exercises given by K. Tevis on 01/11/2022, wand for flexion, wall climb, scapular retraction and stargazer in supine Person educated: Patient Education method: Explanation, Demonstration, Verbal cues, and Handouts Education comprehension: verbalized understanding and returned demonstration  HOME EXERCISE PROGRAM:  Reviewed previously given post op HEP. Access Code: [email or web address] URL: [email or web address] Date: 01/11/2022 Prepared by: Shan Levans  Exercises - Supine Shoulder Flexion with Dowel  - 1 x daily - 7 x weekly - 1-3 sets - 10 reps - 5 seconds hold - Supine Chest Stretch with Elbows Bent  - 1 x daily - 7 x weekly - 1 sets - 3 reps - 30-60seconds hold - Standing Shoulder Abduction Slides at Wall  -  1 x daily - 7 x weekly - 1-3 sets - 5 reps - 5 seconds hold - Seated Scapular Retraction  - 1 x daily - 7 x weekly - 1-3 sets - 10 reps - 2-3 seconds hold  ASSESSMENT:  CLINICAL IMPRESSION: Pt has achieved 3/4 goals established. She continues to be compliant with her HEP for ROM and her self MLD to the right breast. She has purchased a new compression bra which seems to be helping. She received her basic pump and has used it 3 times. She does not notice any improvements with it. She was instructed today how to use her self MLD while using the pump so fluid does not collect on the right side. A peach dot foam pad was made for the medial right breast to assist with fibrosis. She has maintained her shoulder ROM with her HEP, but does still have several small cords left. We have scheduled an additional visit in 4 weeks to assess benefit of pump, but pt knows to call if she feels she needs to be seen earlier or if she has questions.   PT treatment/interventions: ADL/Self care home management, Patient/Family  education, MLD, therapeutic exercise, manual techniques, compression pump?     GOALS: Goals reviewed with patient? Yes  LONG TERM GOALS:  (STG=LTG)  GOALS Name Target Date  Goal status  1 Pt will demonstrate she has regained full shoulder ROM and function post operatively compared to baselines.  Baseline:  06/28/2022  MET 06/29/2022  2 Pt will have decreased right breast swelling/heaviness by 50% or more 06/28/2022 MET  06/29/2022  3 Pt will be independent in self MLD of the right breast 06/28/2022 MET 06/29/2022  4 Breast Complaints survey will be decreased to no greater than 15 to show improvement in symptoms 08/23/2022 IN Progress 32 today     PLAN: PT FREQUENCY/DURATION: 1x/week x 4 weeks prn  PLAN FOR NEXT SESSION: pt is on hold but dates extended in case she needs to return. Check right breast fibrosis, measure chest for benefit of pump, Review MLD prn. [facility]  [address], [address]  [place] [address]  [phone]  Claris Pong, PT 07/26/2022, 10:16 AM

## 2022-08-01 ENCOUNTER — Telehealth: Payer: Self-pay

## 2022-08-01 NOTE — Telephone Encounter (Signed)
VMT pt requesting call back to discuss starting PREP 

## 2022-08-03 NOTE — Progress Notes (Signed)
[facility] PREP Evaluation  Patient Details  Name: [name] MRN: [id number] Date of Birth: [birth year] Age: 62 y.o. PCP: Raymon MuttonSmith, Fred A Jr., FNP  Vitals:   08/03/22 1640  BP: 122/70  Weight: 211 lb 12.8 oz (96.1 kg)     [facility] Eval - 08/03/22 1600       [facility] "PREP" Location   [facility] "PREP" Location [facility]      Referral    Referring Provider Katrinka BlazingSmith    Reason for referral Cancer;Diabetes;Heart Failure;Hypertension;Inactivity;Obesitity/Overweight;Orthopedic    Program Start Date 08/07/22   MW [id number] x 12 wks     Measurement   Waist Circumference 49 inches    Hip Circumference 48 inches    Body fat 48.6 percent      Information for Trainer   Goals Lose weight, get healthier, be able to walk without SOB, goal wt 160    Current Exercise None    Orthopedic Concerns LTHR, LTKR, Right hip pain, sciatica    Pertinent Medical History DM, CHF, HTN, s/p cancer tx    Current Barriers none    Restrictions/Precautions Diabetic snack before exercise   No lifting right arm   Medications that affect exercise Medication causing dizziness/drowsiness      Timed Up and Go (TUGS)   Timed Up and Go Moderate risk 10-12 seconds      Mobility and Daily Activities   I find it easy to walk up or down two or more flights of stairs. 1    I have no trouble taking out the trash. 2    I do housework such as vacuuming and dusting on my own without difficulty. 2    I can easily lift a gallon of milk (8lbs). 1    I can easily walk a mile. 1    I have no trouble reaching into high cupboards or reaching down to pick up something from the floor. 2    I do not have trouble doing out-door work such as Loss adjuster, charteredmoving the lawn, raking leaves, or gardening. 1      Mobility and Daily Activities   I feel younger than my age. 1    I feel independent. 2    I feel energetic. 2    I live an active life.  3    I feel strong. 3    I feel healthy. 1    I feel active as other people my age. 3      How fit  and strong are you.   Fit and Strong Total Score 25            Past Medical History:  Diagnosis Date   Allergy    Anginal pain    a. NL cath in 2008;  b. Myoview 03/2011: dec uptake along mid anterior wall on stress imaging -> ? attenuation vs. ischemia, EF 65%;  c. Echo 04/2011: EF 55-60%, no RWMA, Gr 2 dd   Anxiety    Arthritis    Asthma    Back pain    Bone cancer    Cancer    Chest pain    CHF (congestive heart failure)    Clotting disorder    Constipation    Depression    Diabetes mellitus    Drug use    Dyspnea    Frequent urination    GERD (gastroesophageal reflux disease)    Glaucoma    History of stomach ulcers    HLD (hyperlipidemia)    Hypertension  IBS (irritable bowel syndrome)    Joint pain    Joint pain    Lactose intolerance    Leg edema    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 benign thymoma   Neuromuscular disorder    Obesity    Palpitations    Pneumonia 05/2016   double   Pulmonary edema    Pulmonary embolism    a. 2008 -> coumadin x 6 mos.   Rheumatoid arthritis    Sleep apnea    on CPAP 02/2018   SOB (shortness of breath)    Thyroid disease    TIA (transient ischemic attack)    Urinary urgency    Vitamin D deficiency    Past Surgical History:  Procedure Laterality Date   ABDOMINAL HYSTERECTOMY  2005   APPENDECTOMY     BREAST LUMPECTOMY WITH RADIOACTIVE SEED AND SENTINEL LYMPH NODE BIOPSY Right 11/16/2021   Procedure: RIGHT BREAST RADIOACTIVE SEED LOCALIZED LUMPECTOMY WITH RIGHT AXILLARY SENTINEL LYMPH NODE BIOPSY;  Surgeon: Manus Rudd, MD;  Location: [facility];  Service: General;  Laterality: Right;  LMA & PEC BLOCK   CARDIAC CATHETERIZATION     Normal   CARDIAC CATHETERIZATION N/A 09/30/2014   Procedure: Left Heart Cath and Coronary Angiography;  Surgeon: Tonny Bollman, MD;  [facility];  Service: Cardiovascular;  Laterality: N/A;   LAPAROSCOPIC APPENDECTOMY N/A 06/03/2012   Procedure:  APPENDECTOMY LAPAROSCOPIC;  Surgeon: Almond Lint, MD;  Location: [facility];  Service: General;  Laterality: N/A;   Left knee surgery  2008   LEG SURGERY     TONSILLECTOMY     TOTAL HIP ARTHROPLASTY Left 06/11/2018   Procedure: LEFT TOTAL HIP ARTHROPLASTY ANTERIOR APPROACH;  Surgeon: Cammy Copa, MD;  Location: [facility];  Service: Orthopedics;  Laterality: Left;   TOTAL KNEE ARTHROPLASTY Left 08/22/2016   Procedure: TOTAL KNEE ARTHROPLASTY;  Surgeon: Cammy Copa, MD;  [facility];  Service: Orthopedics;  Laterality: Left;   TUBAL LIGATION  1989   Social History   Tobacco Use  Smoking Status Former   Packs/day: 0.50   Years: 15.00   Additional pack years: 0.00   Total pack years: 7.50   Types: Cigarettes   Quit date: 04/24/1985   Years since quitting: 37.3  Smokeless Tobacco Never    Bonnye Fava 08/03/2022, 4:47 PM

## 2022-08-08 ENCOUNTER — Inpatient Hospital Stay: Payer: 59 | Attending: Hematology and Oncology | Admitting: Hematology and Oncology

## 2022-08-08 DIAGNOSIS — Z171 Estrogen receptor negative status [ER-]: Secondary | ICD-10-CM

## 2022-08-08 DIAGNOSIS — C50411 Malignant neoplasm of upper-outer quadrant of right female breast: Secondary | ICD-10-CM

## 2022-08-08 NOTE — Progress Notes (Signed)
I called her to discuss mammogram and [name] results, neg for malignancy. She didn't answer. Left a voicemail to call us back.  Rachel Moulds MD

## 2022-08-14 ENCOUNTER — Ambulatory Visit (INDEPENDENT_AMBULATORY_CARE_PROVIDER_SITE_OTHER): Payer: 59 | Admitting: Family Medicine

## 2022-08-14 ENCOUNTER — Encounter (INDEPENDENT_AMBULATORY_CARE_PROVIDER_SITE_OTHER): Payer: Self-pay | Admitting: Family Medicine

## 2022-08-14 VITALS — BP 160/77 | HR 67 | Temp 97.7°F | Ht 59.0 in | Wt 211.0 lb

## 2022-08-14 DIAGNOSIS — Z6841 Body Mass Index (BMI) 40.0 and over, adult: Secondary | ICD-10-CM

## 2022-08-14 DIAGNOSIS — E559 Vitamin D deficiency, unspecified: Secondary | ICD-10-CM | POA: Diagnosis not present

## 2022-08-14 DIAGNOSIS — E1159 Type 2 diabetes mellitus with other circulatory complications: Secondary | ICD-10-CM

## 2022-08-14 DIAGNOSIS — I152 Hypertension secondary to endocrine disorders: Secondary | ICD-10-CM | POA: Diagnosis not present

## 2022-08-14 DIAGNOSIS — Z7984 Long term (current) use of oral hypoglycemic drugs: Secondary | ICD-10-CM

## 2022-08-14 DIAGNOSIS — E1165 Type 2 diabetes mellitus with hyperglycemia: Secondary | ICD-10-CM | POA: Diagnosis not present

## 2022-08-14 DIAGNOSIS — E669 Obesity, unspecified: Secondary | ICD-10-CM

## 2022-08-14 NOTE — Progress Notes (Signed)
Chief Complaint:   OBESITY [name] is here to discuss her progress with her obesity treatment plan along with follow-up of her obesity related diagnoses. [name] is on the Category 2 Plan and states she is following her eating plan approximately 0% of the time. [name] states she is doing a little walking.  Today's visit was #: 11 Starting weight: 209 LBS Starting date: 06/01/2021 Today's weight: 211 LBS Today's date: 08/14/2022 Total lbs lost to date: 0 Total lbs lost since last in-office visit: +12 lbs  Interim History: Patient had to stop the program last year in September after being diagnosed with breast cancer.  She is dealing with lymphedema in her right arm after her treatments. She did 4 cycles of TC and adjuvant radiation.  She is ready to recommit to the program.  Mental health wise she is trying to get herself out of her house and is going back to her old job.  Subjective:   1. Type 2 diabetes mellitus with hyperglycemia, without long-term current use of insulin Patient is on Jardiance and metformin.  Recent A1c was 7.3.  Patient had tried Ozempic in the past but did not feel that it helped.    2. Hypertension associated with type 2 diabetes mellitus Patients blood pressure is elevated today.  Patient is taking Cozaar, aldactone and Coreg.    3. Vitamin D deficiency Patient was previously on prescription Vitamin D.  Patient denies any nausea, vomiting, muscle weakness.   Assessment/Plan:   1. Type 2 diabetes mellitus with hyperglycemia, without long-term current use of insulin Revisit GLP/GIP/GLP combo at next appointment check labs today.  - Insulin, random  2.  Hypertension associated with type 2 diabetes mellitus Continue current medications no change in dose.  3. Vitamin D deficiency Check labs today.  - VITAMIN D 25 Hydroxy (Vit-D Deficiency, Fractures)  4. BMI 40.0-44.9, adult  5. Obesity with starting BMI of 42.4 [name] is currently in the action stage  of change. As such, her goal is to continue with weight loss efforts. She has agreed to the Category 2 Plan.   Exercise goals: No exercise has been prescribed at this time.  Behavioral modification strategies: increasing lean protein intake, meal planning and cooking strategies, keeping healthy foods in the home, and planning for success.  [name] has agreed to follow-up with our clinic in 2-3 weeks. She was informed of the importance of frequent follow-up visits to maximize her success with intensive lifestyle modifications for her multiple health conditions.   [name] was informed we would discuss her lab results at her next visit unless there is a critical issue that needs to be addressed sooner. [name] agreed to keep her next visit at the agreed upon time to discuss these results.  [name] was informed we would discuss her lab results at her next visit unless there is a critical issue that needs to be addressed sooner. [name] agreed to keep her next visit at the agreed upon time to discuss these results.  Objective:   Blood pressure (!) 160/77, pulse 67, temperature 97.7 F (36.5 C), height  (1.499 m), weight 211 lb (95.7 kg), SpO2 97 %. Body mass index is 42.62 kg/m.  General: Cooperative, alert, well developed, in no acute distress. HEENT: Conjunctivae and lids unremarkable. Cardiovascular: Regular rhythm.  Lungs: Normal work of breathing. Neurologic: No focal deficits.   Lab Results  Component Value Date   CREATININE 0.97 07/16/2022   BUN 14 07/16/2022   NA 140 07/16/2022  K 4.2 07/16/2022   CL 107 07/16/2022   CO2 22 07/16/2022   Lab Results  Component Value Date   ALT 26 07/16/2022   AST 21 07/16/2022   ALKPHOS 51 07/16/2022   BILITOT 0.2 (L) 07/16/2022   Lab Results  Component Value Date   HGBA1C 7.3 (H) 07/15/2022   HGBA1C 6.4 (H) 09/28/2021   HGBA1C 8.4 (H) 03/08/2021   HGBA1C 7.6 (H) 02/03/2020   HGBA1C 7.5 (H) 10/07/2019   Lab Results  Component  Value Date   INSULIN WILL FOLLOW 08/14/2022   INSULIN 22.1 09/28/2021   INSULIN 26.8 (H) 06/01/2021   INSULIN 44.4 (H) 02/03/2020   INSULIN 28.1 (H) 12/26/2018   Lab Results  Component Value Date   TSH 0.467 03/24/2022   Lab Results  Component Value Date   CHOL 127 07/16/2022   HDL 48 07/16/2022   LDLCALC 55 07/16/2022   TRIG 119 07/16/2022   CHOLHDL 2.6 07/16/2022   Lab Results  Component Value Date   VD25OH 30.3 08/14/2022   VD25OH 39.8 09/28/2021   VD25OH 30.0 06/01/2021   Lab Results  Component Value Date   WBC 3.7 (L) 07/16/2022   HGB 13.1 07/16/2022   HCT 41.3 07/16/2022   MCV 87.5 07/16/2022   PLT 165 07/16/2022   No results found for: "IRON", "TIBC", "FERRITIN"  Attestation Statements:   Reviewed by clinician on day of visit: allergies, medications, problem list, medical history, surgical history, family history, social history, and previous encounter notes.  I, Malcolm Metro, RMA, am acting as transcriptionist for Reuben Likes, MD. I have reviewed the above documentation for accuracy and completeness, and I agree with the above. - Reuben Likes, MD

## 2022-08-19 IMAGING — CT CT ANGIO CHEST
2 of 6 series · 17 of 36 positions shown · IV contrast (OMNIPAQUE 350)
Comparison: 09/03/2020
COMPARISON: 09/03/2020

Addendum:
CLINICAL DATA: Chest pain, hemoptysis, onset of symptoms yesterday,
elevated D-dimer, question pulmonary embolism

EXAM:
CT ANGIOGRAPHY CHEST WITH CONTRAST
TECHNIQUE: Multidetector CT imaging of the chest was performed using the
standard protocol during bolus administration of intravenous
contrast. Multiplanar CT image reconstructions and MIPs were
obtained to evaluate the vascular anatomy.
CONTRAST:  100mL OMNIPAQUE IOHEXOL 350 MG/ML SOLN IV

[Series 5: thins · axial · 0.62mm/px · z∈[+1442,+1664]mm · 16 of 250 slices shown]
[im 14/250  lung]
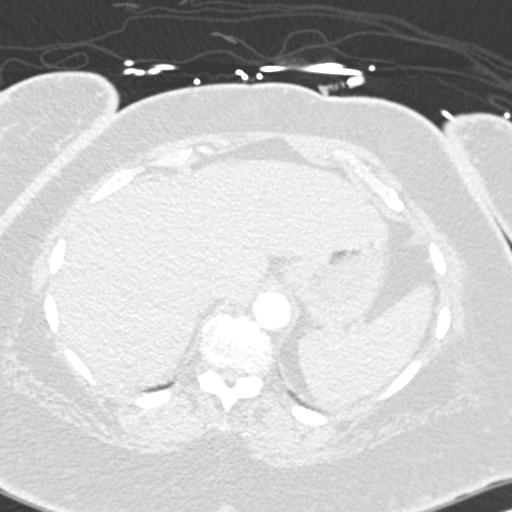
[im 28/250  mediastinal]
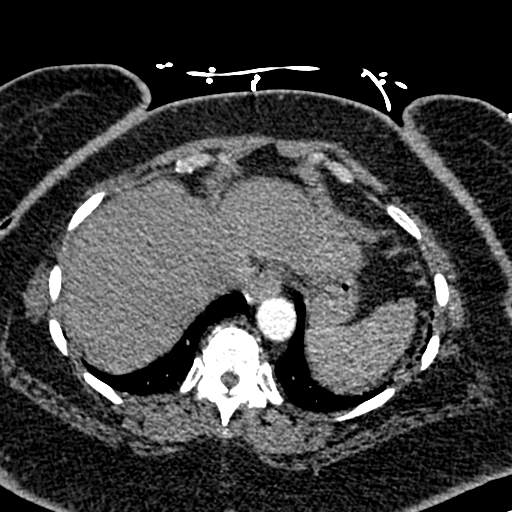
[im 42/250  lung]
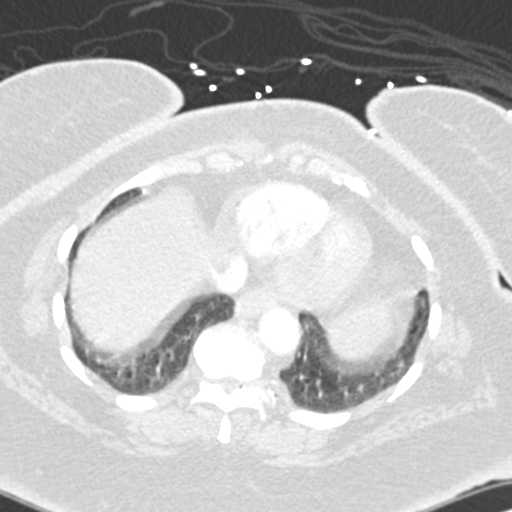
[im 56/250  mediastinal]
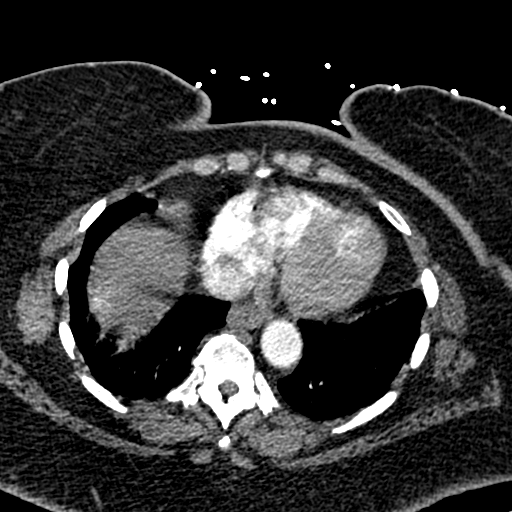
[im 70/250  lung]
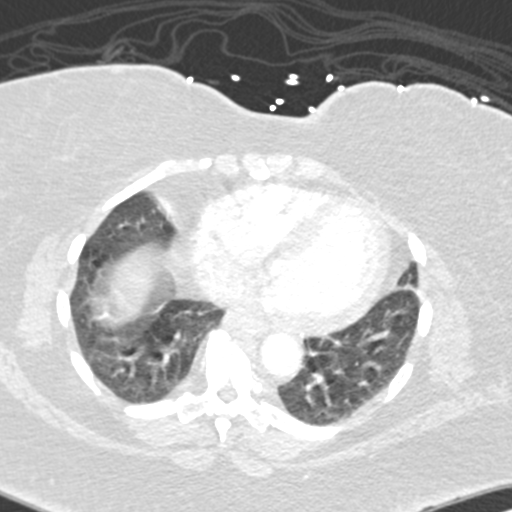
[im 84/250  mediastinal]
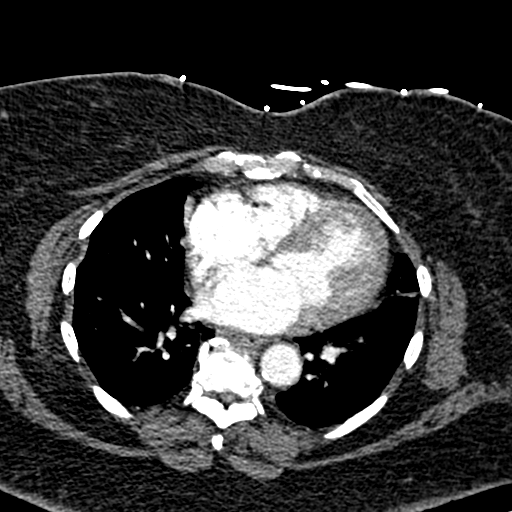
[im 97/250  lung]
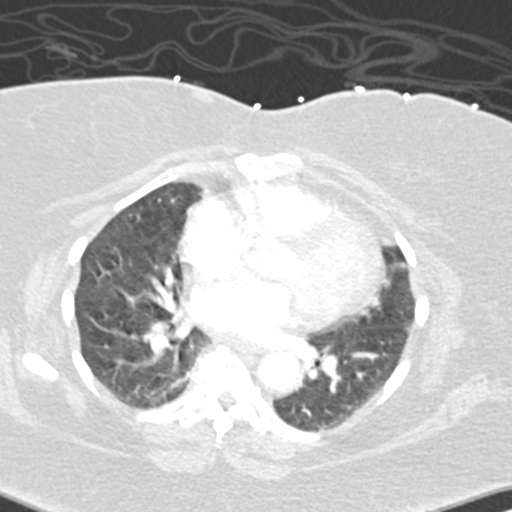
[im 111/250  mediastinal]
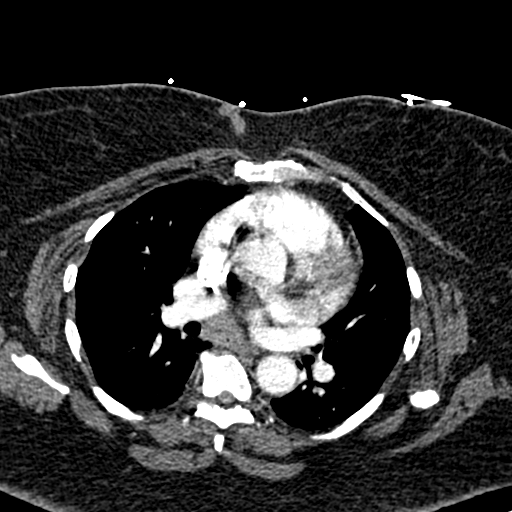
[im 139/250  lung]
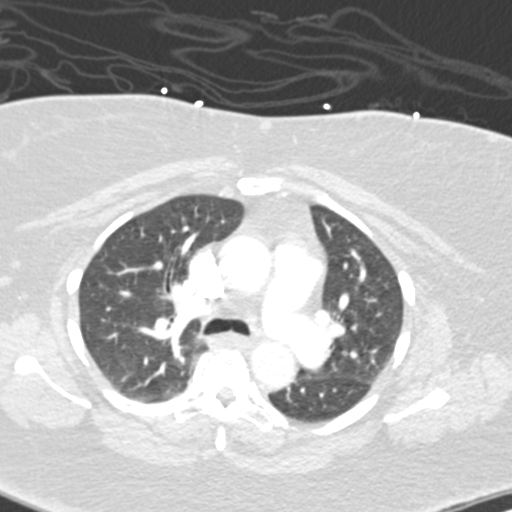
[im 153/250  mediastinal]
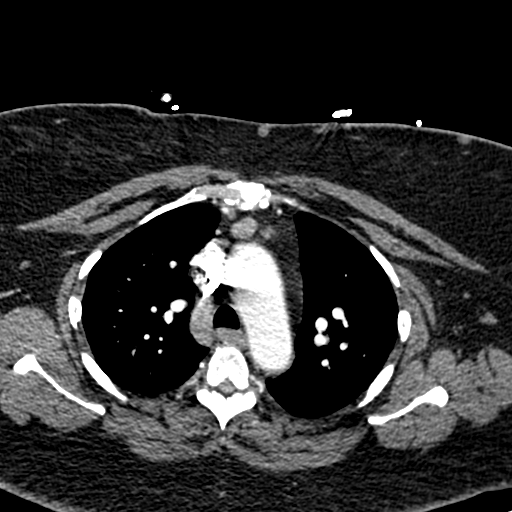
[im 167/250  lung]
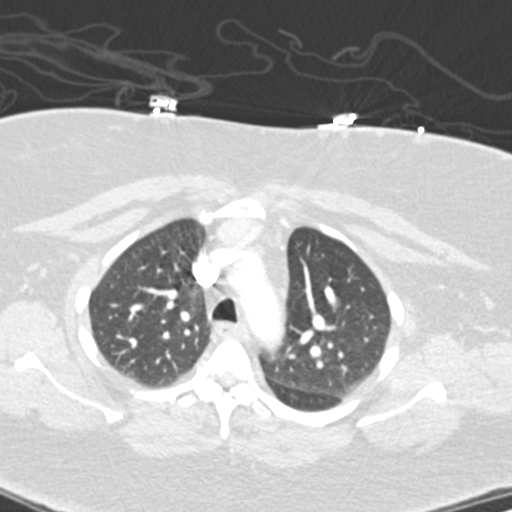
[im 180/250  mediastinal]
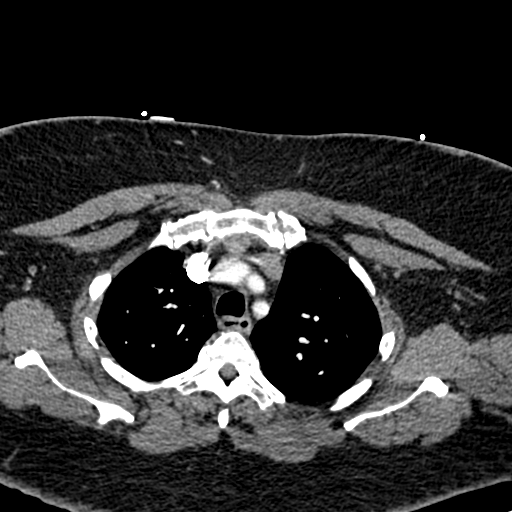
[im 194/250  lung]
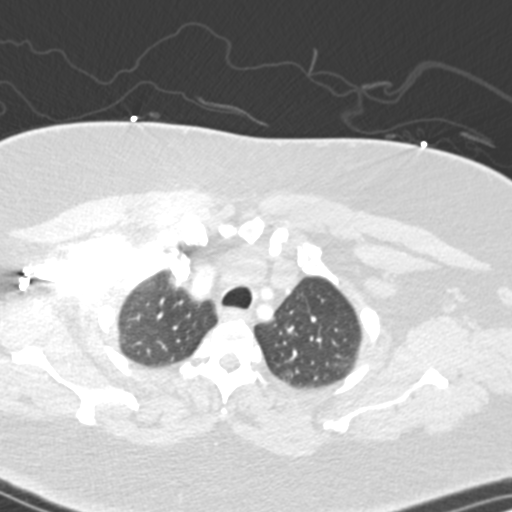
[im 208/250  mediastinal]
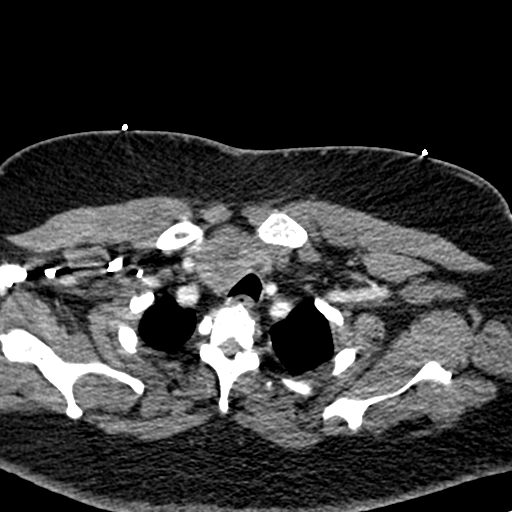
[im 222/250  lung]
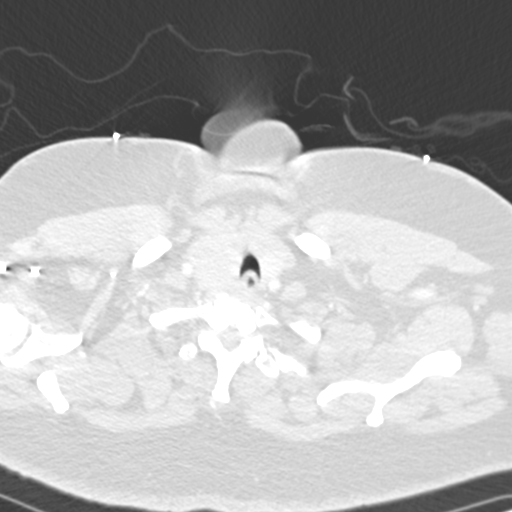
[im 236/250  mediastinal]
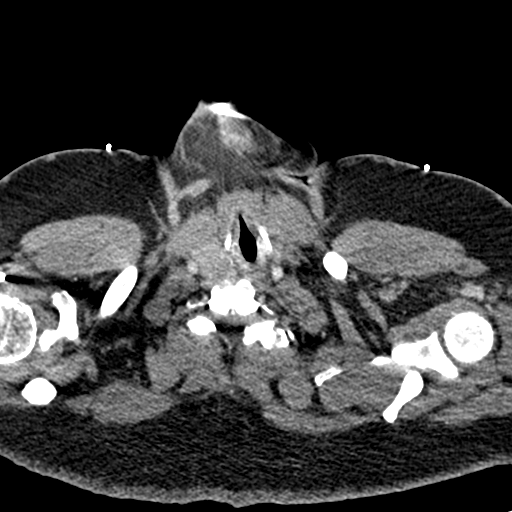

[Series 7: coronal mpr · coronal · 0.50mm/px · 1 of 120 slices shown]
[im 60/120  mediastinal]
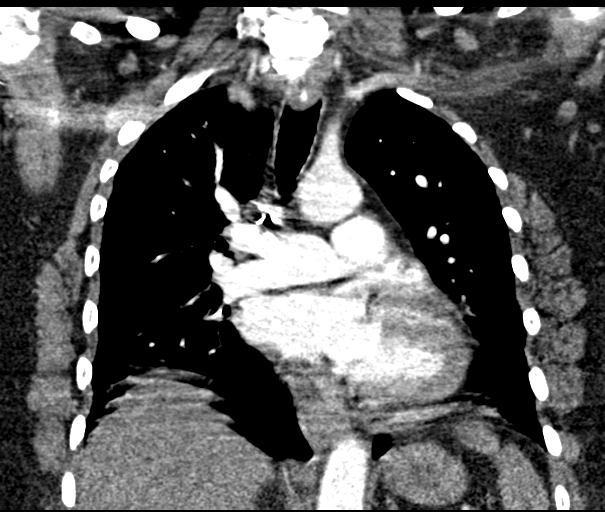

[17 of 36 positions shown; findings below may reference images not displayed]

FINDINGS: Cardiovascular: Atherosclerotic calcifications aorta. Aorta normal
caliber without aneurysm or dissection. Heart unremarkable. No
pericardial effusion. Degradation of image quality secondary to body
habitus. Pulmonary arteries adequately opacified with upper normal
size of main pulmonary artery. Respiratory motion artifacts at lower
lobes. No definite evidence of pulmonary embolism.

Mediastinum/Nodes: Esophagus unremarkable. Asymmetric enlargement of
the RIGHT thyroid lobe containing a chronic 3.4 x 3.0 cm diameter
mass. Additionally, retro manubrial mass 3.0 x 1.8 cm image 43,
unchanged since 08/14/2019 and present on earlier studies back to
4446, question exophytic thyroid nodule. Base of cervical region
otherwise normal appearance. No thoracic adenopathy.

Lungs/Pleura: Minimal subsegmental atelectasis dependently in RIGHT
lower lobe and at LEFT lung base. No pulmonary infiltrate, pleural
effusion, or pneumothorax.

Upper Abdomen: Visualized upper abdomen unremarkable

Musculoskeletal: No acute osseous findings.

Review of the MIP images confirms the above findings.
IMPRESSION: No definite evidence of pulmonary embolism with exam degraded by
respiratory motion artifacts at the lower lobes and body habitus.

Stable RIGHT thyroid mass.

Stable anterior mediastinal mass, potentially exophytic nodule
arising from inferior pole of RIGHT thyroid lobe.

Minimal atelectasis in lower lobes.

Aortic Atherosclerosis (24PPV-GFO.O).

ADDENDUM:
The RIGHT thyroid mass was previously assessed by ultrasound on
05/27/2020, with lesion meeting TI-RADS criteria for tissue
sampling; please refer to prior thyroid ultrasound report for
description.

*** End of Addendum ***
FINDINGS: Cardiovascular: Atherosclerotic calcifications aorta. Aorta normal
caliber without aneurysm or dissection. Heart unremarkable. No
pericardial effusion. Degradation of image quality secondary to body
habitus. Pulmonary arteries adequately opacified with upper normal
size of main pulmonary artery. Respiratory motion artifacts at lower
lobes. No definite evidence of pulmonary embolism.

Mediastinum/Nodes: Esophagus unremarkable. Asymmetric enlargement of
the RIGHT thyroid lobe containing a chronic 3.4 x 3.0 cm diameter
mass. Additionally, retro manubrial mass 3.0 x 1.8 cm image 43,
unchanged since 08/14/2019 and present on earlier studies back to
4446, question exophytic thyroid nodule. Base of cervical region
otherwise normal appearance. No thoracic adenopathy.

Lungs/Pleura: Minimal subsegmental atelectasis dependently in RIGHT
lower lobe and at LEFT lung base. No pulmonary infiltrate, pleural
effusion, or pneumothorax.

Upper Abdomen: Visualized upper abdomen unremarkable

Musculoskeletal: No acute osseous findings.

Review of the MIP images confirms the above findings.
IMPRESSION: No definite evidence of pulmonary embolism with exam degraded by
respiratory motion artifacts at the lower lobes and body habitus.

Stable RIGHT thyroid mass.

Stable anterior mediastinal mass, potentially exophytic nodule
arising from inferior pole of RIGHT thyroid lobe.

Minimal atelectasis in lower lobes.

Aortic Atherosclerosis (24PPV-GFO.O).

## 2022-08-22 LAB — VITAMIN D 25 HYDROXY (VIT D DEFICIENCY, FRACTURES): Vit D, 25-Hydroxy: 30.3 ng/mL (ref 30.0–100.0)

## 2022-08-22 LAB — INSULIN, RANDOM: INSULIN: 13.4 u[IU]/mL (ref 2.6–24.9)

## 2022-08-25 ENCOUNTER — Other Ambulatory Visit (INDEPENDENT_AMBULATORY_CARE_PROVIDER_SITE_OTHER): Payer: Self-pay | Admitting: Family Medicine

## 2022-08-25 DIAGNOSIS — E559 Vitamin D deficiency, unspecified: Secondary | ICD-10-CM

## 2022-08-29 ENCOUNTER — Ambulatory Visit: Payer: 59 | Attending: Hematology and Oncology

## 2022-08-29 DIAGNOSIS — I890 Lymphedema, not elsewhere classified: Secondary | ICD-10-CM | POA: Insufficient documentation

## 2022-08-29 DIAGNOSIS — Z171 Estrogen receptor negative status [ER-]: Secondary | ICD-10-CM | POA: Insufficient documentation

## 2022-08-29 DIAGNOSIS — C50411 Malignant neoplasm of upper-outer quadrant of right female breast: Secondary | ICD-10-CM | POA: Insufficient documentation

## 2022-08-29 DIAGNOSIS — R293 Abnormal posture: Secondary | ICD-10-CM

## 2022-08-29 DIAGNOSIS — M25611 Stiffness of right shoulder, not elsewhere classified: Secondary | ICD-10-CM

## 2022-08-29 DIAGNOSIS — Z483 Aftercare following surgery for neoplasm: Secondary | ICD-10-CM | POA: Diagnosis present

## 2022-08-29 NOTE — Therapy (Signed)
OUTPATIENT PHYSICAL THERAPY BREAST CANCER RE-EVAL   Patient Name: [name] MRN: [id number] DOB:04-27-1960, 62 y.o., female Today's Date: 08/29/2022   PT End of Session - 08/29/22 1505     Visit Number 11    Number of Visits 15    Date for PT Re-Evaluation 10/24/22    PT Start Time 1505    PT Stop Time 1555    PT Time Calculation (min) 50 min    Activity Tolerance Patient tolerated treatment well    Behavior During Therapy [facility] for tasks assessed/performed              Past Medical History:  Diagnosis Date   Allergy    Anginal pain (HCC)    a. NL cath in 2008;  b. Myoview 03/2011: dec uptake along mid anterior wall on stress imaging -> ? attenuation vs. ischemia, EF 65%;  c. 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 2008 -> coumadin x 6 mos.   Rheumatoid arthritis (HCC)    Sleep apnea    on CPAP 02/2018   SOB (shortness of breath)    Thyroid disease    TIA (transient ischemic attack)    Urinary urgency    Vitamin D deficiency    Past Surgical History:  Procedure Laterality Date   ABDOMINAL HYSTERECTOMY  2005   APPENDECTOMY     BREAST LUMPECTOMY WITH RADIOACTIVE SEED AND SENTINEL LYMPH NODE BIOPSY Right 11/16/2021   Procedure: RIGHT BREAST RADIOACTIVE SEED LOCALIZED  LUMPECTOMY WITH RIGHT AXILLARY SENTINEL LYMPH NODE BIOPSY;  Surgeon: Manus Rudd, MD;  Location: [facility];  Service: General;  Laterality: Right;  LMA & PEC BLOCK   CARDIAC CATHETERIZATION     Normal   CARDIAC CATHETERIZATION N/A 09/30/2014   Procedure: Left Heart Cath and Coronary Angiography;  Surgeon: Tonny Bollman, MD;  Location: [facility] INVASIVE CV LAB;  Service: Cardiovascular;  Laterality: N/A;   LAPAROSCOPIC APPENDECTOMY N/A 06/03/2012   Procedure: APPENDECTOMY LAPAROSCOPIC;  Surgeon: Almond Lint, MD;  Location: [facility] OR;  Service: General;  Laterality: N/A;   Left knee surgery  2008   LEG SURGERY     TONSILLECTOMY     TOTAL HIP ARTHROPLASTY Left 06/11/2018   Procedure: LEFT TOTAL HIP ARTHROPLASTY ANTERIOR APPROACH;  Surgeon: Cammy Copa, MD;  Location: [facility] OR;  Service: Orthopedics;  Laterality: Left;   TOTAL KNEE ARTHROPLASTY Left 08/22/2016   Procedure: TOTAL KNEE ARTHROPLASTY;  Surgeon: Cammy Copa, MD;  Location: [facility] OR;  Service: Orthopedics;  Laterality: Left;   TUBAL LIGATION  1989   Patient Active Problem List   Diagnosis Date Noted   Lung nodules 07/15/2022   Heart failure (HCC) 03/25/2022   Chest pain 03/24/2022   Multiple thyroid nodules 11/28/2021   Genetic testing 11/22/2021   Family history of breast cancer 11/03/2021   Family history of colon cancer 11/03/2021   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   Back pain 06/01/2021   Right thyroid nodule 05/31/2021   At risk for impaired metabolic function 04/12/2020   Unilateral primary osteoarthritis, left hip    Hip arthritis 06/11/2018   Class 3 severe obesity with serious comorbidity and body mass index (BMI) of 40.0 to 44.9 in adult (HCC) 05/29/2018   Other fatigue 11/20/2017   Shortness of breath on exertion 11/20/2017   Type 2 diabetes mellitus with hyperlipidemia (HCC) 11/20/2017   Vitamin D deficiency 11/20/2017   Depression with anxiety  11/20/2017   Other hyperlipidemia 11/20/2017   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/04/2016   Presence of left artificial knee joint 09/20/2016   Arthritis of knee 08/22/2016   Cervical radiculopathy 07/26/2016   Cervical disc disorder with radiculopathy 07/26/2016   Right arm pain 06/29/2016   Cervicalgia 06/29/2016   Primary osteoarthritis of left knee 06/29/2016   Hypersomnia with sleep apnea 11/18/2015   Lethargy 11/18/2015   Exertional chest pain 09/30/2014   Abnormal cardiac function test 09/29/2014   Chest pain with moderate risk for cardiac etiology 09/28/2014   HTN (hypertension) 09/28/2014   AKI (acute kidney injury) (HCC)    Paresthesia 06/18/2014   Numbness and tingling of left arm and leg    Unstable angina (HCC) 05/24/2014   OSA on CPAP 05/24/2014   CAD (coronary artery disease) 11/25/2012   S/P laparoscopic appendectomy 06/04/2012   Acute on chronic diastolic CHF (congestive heart failure) (HCC) 01/09/2012   Mediastinal mass 01/09/2012   SOB (shortness of breath) 06/20/2011   Mediastinal abnormality 06/20/2011   Dyslipidemia 12/23/2009   GLAUCOMA 12/23/2009   ARTHRITIS 12/23/2009   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 FNP   REFERRING PROVIDER: Dr. Corliss Skains   REFERRING DIAG: Rt breast cancer  THERAPY DIAG:  Malignant neoplasm of upper-outer quadrant of right breast in female, estrogen receptor negative (HCC)  Lymphedema, not elsewhere classified  Aftercare following surgery for neoplasm  Stiffness of right shoulder, not elsewhere  classified  Abnormal posture  Rationale for Evaluation and Treatment Rehabilitation  ONSET DATE: 10/28/21  SUBJECTIVE:  SUBJECTIVE STATEMENT:  I used  the basic  pump I got  but it  was making my breast and upper chest swell more . I have been doing the self MLD about 4-5 times/week.  I feel like the swelling has improved a little. The right breast still feels heavier,and still feels firm at the medial breast. I lost the little peach foam that you gave me and I think it was doing well. The gray foam just seems to slide up. I wear my compression bra day and night. I had the Flexi touch trial on April 25 and she measured me as being less at the chest after the flexi touch trial. 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 Other hx includes DM, CHF, hx of stroke, anxiety.  She completed Chemotherapy and She finished radiation 05/02/2022.  Had an axillary seroma drained x 4.  She was hospitalized Dec 1st through the 5th for fluid in her legs and lungs, and she had some PT at home after the The new bras are better.hospitalization about 2 times.  PATIENT GOALS:  Reassess how my recovery is going related to arm function, pain, and swelling.  PAIN:  Are you having pain? No not presently, but was having breast pain before the antibiotics  PRECAUTIONS: Recent Surgery, right UE Lymphedema risk, DM, CHF, hx of CVA,anxiety  ACTIVITY LEVEL / LEISURE: reading, TV    OBJECTIVE:   PATIENT SURVEYS:  QUICK DASH:not given  BREAST COMPLAINTS QUESTIONNAIRE( EVAL) Pain:2 Heaviness:6 Swollen feeling:9 Tense Skin:5 Redness:9 Bra Print:6 Size of Pores:8 Hard feeling:  8 Total:    53 /80 A Score over 9 indicates lymphedema issues in the breast 37 today (3/7/20240 BREAST COMPLAINTS QUESTIONNAIRE (06/29/2022) Pain:3 Heaviness:7 Swollen feeling:7 Tense Skin:4 Redness:2 Bra Print:2 Size of Pores:5 Hard feeling: 2 Total:   32  /80 A Score over 9 indicates lymphedema issues in the breast  BREAST COMPLAINTS QUESTIONNAIRE (08/29/22) Pain:3 Heaviness:8 Swollen feeling:8 Tense Skin:3 Redness:3 Bra Print:2 Size of Pores:8 Hard feeling: 8 Total:  43   /80 A Score over 9 indicates lymphedema issues in the breast   OBSERVATIONS: EVAL: Large breasted with noted generalized increase in swelling on the right. Hyperpigmentation from radiation and mild skin sloughing from area in axilla that was burned. 06/29/2022 generalized swelling but improved with enlarged pores still present. Several small cords still present in axilla/upper arm but not interfering with ROM. Mild fibrosis noted mainly at medial breast. Small nodular area noted prox to nipple. Advised pt to have it checked EVAL:Several small cords noted in axilla and several larger cords noted in right breast. Fibrosis noted at proximal lateral nipple and mildly medial breast. Pt has been wearing her compression bra and it does feel better 07/26/2022; Fibrosis still noted greatest at medial breast; peach dot foam made and put in stockinette to place in bra. New bra more comfortable for pt, but rides down and doesn't cover upper breast as well. 08/29/22 Enlarged pores/ fibrosis/mild pitting, Peau' d'orange at right medial/inferior breast, continued generalized swelling right breast, upper chest  POSTURE:  Forward head and rounded shoulders posture   UPPER EXTREMITY AROM/PROM:   A/PROM RIGHT   eval 11/11/2021  01/11/22 05/17/2022 RE-eval RIGHT 06/06/2021 RIGHT 06/29/2022 RIGHT 07/26/2022  Shoulder extension 48 60 55 60  60  Shoulder flexion 132pn 135pn 119/pulls 132 132  132  Shoulder abduction 135pn 135 130/ pulls  130 pulls 134 140  Shoulder internal rotation         Shoulder external rotation 78pn in elbow  80 89/pulls 92                            (Blank rows = not tested)   A/PROM LEFT   eval LEFT 06/06/2022  Shoulder extension 55   Shoulder flexion 153 133  Shoulder abduction 145 137  Shoulder internal rotation     Shoulder external rotation 85                           (Blank rows = not tested)     CERVICAL AROM: All within functional limits:    LYMPHEDEMA ASSESSMENTS:    LANDMARK RIGHT   eval 01/11/22 05/17/2022 RIGHT 08/29/2022  10 cm proximal to olecranon process 36 37 37.5 37.8  Olecranon process 29 29.5 29.5 29.9  10 cm proximal to ulnar styloid process 23.5 23.9 23.9 24.8  Just proximal to ulnar styloid process 17.5 17.4 17.2 17.5  Across hand at thumb web space 20.0 19.4 19.7 20.4  At base of 2nd digit 7.3 7.0 6.9 6.9  (Blank rows = not tested)   LANDMARK LEFT   eval 01/11/22 05/17/2022 LEFT 08/29/22  10 cm proximal to olecranon process 35.2 36.5 38.2 37.2  Olecranon process 30.5 30.5 29.6 30  10  cm proximal to ulnar styloid process 23.5 23.5 24.6 25.0  Just proximal to ulnar styloid process 17 17 17.7 18.3  Across hand at thumb web space 20.3 20.5 19.9 20.4  At base of 2nd digit 6.9 6.9 6.9 6.9  (Blank rows = not tested)  07/26/2022; around chest 110 cm  08/29/2022: around chest 111.3    TREATMENT TODAY 08/29/2022 Pts arms and chest measured; no indication of swelling in right arm. Measured chest; increased chest measurement slightly since last measurements New peach foam pad made for right breast and covered in beige stockinette Discussed compression bras as pt is not happy with the fit of her present bra.   07/26/2022 Wall slides flexion and abd  x 5 prior to measuring Measured right shoulder AROM Assessed right breast;fibrosis still noted especially medially Peach dot foam cut for area of fibrosis and pt assisted with placing it in bra. Pt instructed how to use MLD   to activate LN and upper pathway while using  basic pump to prevent increase in chest swelling.   06/29/2022 Discussed goals and pt filled out Breast Complaints Survey MFR to right axillary region, upper arm,  area of cording, PROM right shoulder flexion, scaption, abd, ER In supine head of table elevated: Short neck, 5 diaphragmatic breaths, L axillary nodes and establishment of interaxillary pathway, R inguinal nodes and establishment of axilloinguinal pathway, then R breast moving fluid towards pathways spending extra time in any areas of fibrosis then retracing all steps. Measured ROM  06/22/2022  MFR to right axillary region, upper arm, forearm area of cording, PROM right shoulder flexion, scaption, abd, ER In supine head of table elevated: Short neck, 5 diaphragmatic breaths, L axillary nodes and establishment of interaxillary pathway, R inguinal nodes and establishment of axilloinguinal pathway, then R breast moving fluid towards pathways spending extra time in any areas of fibrosis then retracing all steps.   06/13/2022  MFR to right axillary region, upper arm, forearm area of cording,  PROM right shoulder flexion, scaption, abd, ER In supine head of table elevated: Short neck, 5 diaphragmatic breaths, L axillary nodes and establishment of interaxillary pathway, R inguinal nodes and establishment of axilloinguinal pathway, then R breast moving fluid towards pathways spending extra time in any areas of fibrosis then retracing all steps.     05/23/2022 Foam pad in TG soft made to place under right lower border of bra to prevent irritation and rolling Supine wand flex and scaption x5 MFR to right axillary region, upper arm area of cording PROM right shoulder flexion, scaption, abd, ER In supine head of table elevated: Short neck, 5 diaphragmatic breaths, L axillary nodes and establishment of interaxillary pathway, R inguinal nodes and establishment of axilloinguinal pathway, then R  breast moving fluid towards pathways spending extra time in any areas of fibrosis then retracing all steps.Pt instructed in anatomy and physiology of lymphatic system.    PATIENT EDUCATION: 05/17/2022 (RW) Education details:reviewed 4 post op exercises given by K. Tevis on 01/11/2022, wand for flexion, wall climb, scapular retraction and stargazer in supine Person educated: Patient Education method: Explanation, Demonstration, Verbal cues, and Handouts Education comprehension: verbalized understanding and returned demonstration  HOME EXERCISE PROGRAM:  Reviewed previously given post op HEP. Access Code: [email or web address] URL: [email or web address] Date: 01/11/2022 Prepared by: Gwenevere Abbot  Exercises - Supine Shoulder Flexion with Dowel  - 1 x daily - 7 x weekly - 1-3 sets - 10 reps - 5 seconds hold - Supine Chest Stretch with Elbows Bent  - 1 x daily - 7 x weekly - 1 sets - 3 reps - 30-60seconds hold - Standing Shoulder Abduction Slides at Wall  - 1 x daily - 7 x weekly - 1-3 sets - 5 reps - 5 seconds hold - Seated Scapular Retraction  - 1 x daily - 7 x weekly - 1-3 sets - 10 reps - 2-3 seconds hold  ASSESSMENT:  CLINICAL IMPRESSION: Pt returns today for reassessment after using the basic pump [id number]. She received the basic pump on 07/17/2022 and has had it now for over 4 weeks. Her Cchest measurement when measured today was increased from her previous visit over 4 weeks ago by 1.3 cm. She has failed greater than 4 weeks of conservative therapy including compression, elevation, MLD and exercises. She was hopeful to get good benefit from the basic pump, however, it  failed due to causing increased swelling at the right chest and breast area. Her breast complaints survey also increased from 32 4 weeks ago to 43 today. She continues with significantly larger right breast with fibrosis at the medial breast, enlarged pores and peau d'orange at the medial and inferior breast. She did have a Flexi  touch Demo performed on 08/17/22 and did experience a reduction in swelling at the chest area after its use that day. Due to significantly increased breast edema and increase in chest swelling with the basic pump she requires the Flexitouch for safe home management of her breast lymphedema.  PT treatment/interventions: ADL/Self care home management, Patient/Family education, MLD, therapeutic exercise, manual techniques, compression pump?     GOALS: Goals reviewed with patient? Yes  LONG TERM GOALS:  (STG=LTG)  GOALS Name Target Date  Goal status  1 Pt will demonstrate she has regained full shoulder ROM and function post operatively compared to baselines.  Baseline:  06/28/2022  MET 06/29/2022  2 Pt will have decreased right breast swelling/heaviness by 50% or more 06/28/2022 MET  06/29/2022  3 Pt will be independent  in self MLD of the right breast 06/28/2022 MET 06/29/2022  4 Breast Complaints survey will be decreased to no greater than 15 to show improvement in symptoms 08/23/2022/ 10/24/2022 IN Progress 43 today     PLAN: PT FREQUENCY/DURATION:  4 visits in 8 weeks prn  PLAN FOR NEXT SESSION: reassess  right breast , review MLD prn, benefit of pump.  [facility]  [address], [address]  [place] [address]  [phone]  Waynette Buttery, PT 08/29/2022, 6:37 PM

## 2022-09-04 ENCOUNTER — Encounter (INDEPENDENT_AMBULATORY_CARE_PROVIDER_SITE_OTHER): Payer: Self-pay | Admitting: Family Medicine

## 2022-09-04 ENCOUNTER — Ambulatory Visit (INDEPENDENT_AMBULATORY_CARE_PROVIDER_SITE_OTHER): Payer: 59 | Admitting: Family Medicine

## 2022-09-04 VITALS — BP 136/82 | HR 73 | Temp 97.7°F | Ht 59.0 in | Wt 207.0 lb

## 2022-09-04 DIAGNOSIS — E1165 Type 2 diabetes mellitus with hyperglycemia: Secondary | ICD-10-CM

## 2022-09-04 DIAGNOSIS — E669 Obesity, unspecified: Secondary | ICD-10-CM

## 2022-09-04 DIAGNOSIS — E1159 Type 2 diabetes mellitus with other circulatory complications: Secondary | ICD-10-CM

## 2022-09-04 DIAGNOSIS — E559 Vitamin D deficiency, unspecified: Secondary | ICD-10-CM

## 2022-09-04 DIAGNOSIS — Z7984 Long term (current) use of oral hypoglycemic drugs: Secondary | ICD-10-CM

## 2022-09-04 DIAGNOSIS — F32A Depression, unspecified: Secondary | ICD-10-CM

## 2022-09-04 DIAGNOSIS — F419 Anxiety disorder, unspecified: Secondary | ICD-10-CM

## 2022-09-04 DIAGNOSIS — I152 Hypertension secondary to endocrine disorders: Secondary | ICD-10-CM

## 2022-09-04 DIAGNOSIS — Z6841 Body Mass Index (BMI) 40.0 and over, adult: Secondary | ICD-10-CM

## 2022-09-04 MED ORDER — SERTRALINE HCL 25 MG PO TABS: 25.0000 mg | ORAL_TABLET | Freq: Every day | ORAL | 0 refills | Status: DC

## 2022-09-04 MED ORDER — VITAMIN D (ERGOCALCIFEROL) 1.25 MG (50000 UNIT) PO CAPS: 50000.0000 [IU] | ORAL_CAPSULE | ORAL | 0 refills | Status: DC

## 2022-09-04 NOTE — Progress Notes (Unsigned)
Chief Complaint:   OBESITY [name] is here to discuss her progress with her obesity treatment plan along with follow-up of her obesity related diagnoses. [name] is on the Category 2 Plan and states she is following her eating plan approximately 50% of the time. [name]-40 [facility] 3 times per week.  Today's visit was #: 12 Starting weight: 209 lbs Starting date: 06/01/2021 Today's weight: 207 lbs Today's date: 09/04/2022 Total lbs lost to date: 2 Total lbs lost since last in-office visit: 4  Interim History: Patient voices that she has been just trying to get and stay motivated on plan.  She has been having a significant amount of stress placed on her from others and dealing with car issues with the new car she just purchased.  She has been going to the [facility] to go to do water exercises.  She is working with physical therapy.  She isn't sleeping the best.  She hasn't really felt like cooking much.  She has little plates with little containers to put food in but mentally she is drained.   Subjective:   1. Type 2 diabetes mellitus with hyperglycemia, without long-term current use of insulin (HCC) [name] denies GI side effects.  Her last A1c was 7.3 in March.  2. Hypertension associated with diabetes (HCC) [name]'s blood pressure is controlled today.  She denies chest pain, chest pressure, or headache.  She is on Coreg, losartan, and spironolactone.  3. Vitamin D deficiency [name] denies nausea, vomiting, or muscle weakness but she notes fatigue.  Her last vitamin D level was 30.3.  4. Anxiety and depression [name] denies suicidal or homicidal ideations.  She is on sertraline 25 mg daily.  Assessment/Plan:   1. Type 2 diabetes mellitus with hyperglycemia, without long-term current use of insulin (HCC) We will refill metformin 500 mg daily #30 for 1 month.  2. Hypertension associated with diabetes (HCC) [name] will continue her  current medications.  3. Vitamin D deficiency We will refill prescription vitamin D for 1 month.  - Vitamin D, Ergocalciferol, (DRISDOL) 1.25 MG (50000 UNIT) CAPS capsule; Take 1 capsule (50,000 Units total) by mouth every 7 (seven) days.  Dispense: 4 capsule; Refill: 0  4. Anxiety and depression We will refill sertraline for 90 days.  - sertraline (ZOLOFT) 25 MG tablet; Take 1 tablet (25 mg total) by mouth daily.  Dispense: 90 tablet; Refill: 0  5. BMI 40.0-44.9, adult (HCC)  6. Obesity with starting BMI of 42.4 [name] is currently in the action stage of change. As such, her goal is to continue with weight loss efforts. She has agreed to the Category 2 Plan.   Exercise goals: All adults should avoid inactivity. Some physical activity is better than none, and adults who participate in any amount of physical activity gain some health benefits.  Behavioral modification strategies: increasing lean protein intake, meal planning and cooking strategies, keeping healthy foods in the home, and planning for success.  [name] has agreed to follow-up with our clinic in 3 weeks. She was informed of the importance of frequent follow-up visits to maximize her success with intensive lifestyle modifications for her multiple health conditions.   Objective:   Blood pressure 136/82, pulse 73, temperature 97.7 F (36.5 C), height 4\' 11"  (1.499 m), weight 207 lb (93.9 kg), SpO2 99 %. Body mass index is 41.81 kg/m.  General: Cooperative, alert, well developed, in no acute distress. HEENT: Conjunctivae and lids  unremarkable. Cardiovascular: Regular rhythm.  Lungs: Normal work of breathing. Neurologic: No focal deficits.   Lab Results  Component Value Date   CREATININE 0.97 07/16/2022   BUN 14 07/16/2022   NA 140 07/16/2022   K 4.2 07/16/2022   CL 107 07/16/2022   CO2 22 07/16/2022   Lab Results  Component Value Date   ALT 26 07/16/2022   AST 21 07/16/2022   ALKPHOS 51 07/16/2022   BILITOT  0.2 (L) 07/16/2022   Lab Results  Component Value Date   HGBA1C 7.3 (H) 07/15/2022   HGBA1C 6.4 (H) 09/28/2021   HGBA1C 8.4 (H) 03/08/2021   HGBA1C 7.6 (H) 02/03/2020   HGBA1C 7.5 (H) 10/07/2019   Lab Results  Component Value Date   INSULIN 13.4 08/14/2022   INSULIN 22.1 09/28/2021   INSULIN 26.8 (H) 06/01/2021   INSULIN 44.4 (H) 02/03/2020   INSULIN 28.1 (H) 12/26/2018   Lab Results  Component Value Date   TSH 0.467 03/24/2022   Lab Results  Component Value Date   CHOL 127 07/16/2022   HDL 48 07/16/2022   LDLCALC 55 07/16/2022   TRIG 119 07/16/2022   CHOLHDL 2.6 07/16/2022   Lab Results  Component Value Date   VD25OH 30.3 08/14/2022   VD25OH 39.8 09/28/2021   VD25OH 30.0 06/01/2021   Lab Results  Component Value Date   WBC 3.7 (L) 07/16/2022   HGB 13.1 07/16/2022   HCT 41.3 07/16/2022   MCV 87.5 07/16/2022   PLT 165 07/16/2022   No results found for: "IRON", "TIBC", "FERRITIN"  Attestation Statements:   Reviewed by clinician on day of visit: allergies, medications, problem list, medical history, surgical history, family history, social history, and previous encounter notes.   I, Burt Knack, am acting as transcriptionist for Reuben Likes, MD.  I have reviewed the above documentation for accuracy and completeness, and I agree with the above. - Reuben Likes, MD

## 2022-09-05 ENCOUNTER — Encounter: Payer: Self-pay | Admitting: General Practice

## 2022-09-05 NOTE — Progress Notes (Signed)
[facility] Spiritual Care Note  Returned call from Ms Sheely and scheduled appointment in Spiritual Care office for tomorrow, 09/06/2022, at 1:30 pm.   Caro Hight, MDiv, [name] Pager [phone] Voicemail [phone]

## 2022-09-06 ENCOUNTER — Inpatient Hospital Stay: Payer: 59 | Attending: Hematology and Oncology | Admitting: General Practice

## 2022-09-06 ENCOUNTER — Other Ambulatory Visit: Payer: Self-pay

## 2022-09-07 ENCOUNTER — Encounter: Payer: Self-pay | Admitting: Hematology and Oncology

## 2022-09-07 MED ORDER — METFORMIN HCL 500 MG PO TABS: 500.0000 mg | ORAL_TABLET | Freq: Every day | ORAL | 0 refills | Status: DC

## 2022-09-07 NOTE — Progress Notes (Signed)
[facility] Spiritual Care Note  Met with [name] in Spiritual Care office as planned, providing reflective listening, pastoral reflection, and spiritual/emotional support as she shared and processed a number of stressors. She has been resilient through deeply significant and challenging life events, which has helped her develop transferable coping skills. One of her stated goals is to work more on her faith and spiritual life as a tool for making meaning and turning over stress and the human desire for control.  We plan to follow up in person next week to explore her goals and hopes in more detail, and to explore additional support resources that might be helpful.   [address] [name], [place], [facility] Pager [phone] Voicemail [phone]

## 2022-09-09 IMAGING — US US FNA BIOPSY THYROID 1ST LESION
1 series · 13 of 13 positions shown · non-contrast
Comparison: Thyroid ultrasound dated 05/27/2020

INDICATION: Patient with history of incidental thyroid nodule noted on CT scan
and thyroid ultrasound on 05/27/2020 which revealed a 3.8 cm right
inferior nodule which meets criteria for biopsy. She presents today
for the procedure.

EXAM:
ULTRASOUND GUIDED FINE NEEDLE ASPIRATION BIOPSY OF RIGHT INFERIOR
THYROID NODULE
TECHNIQUE: Informed written consent was obtained from the patient after a
discussion of the risks, benefits and alternatives to treatment.
Questions regarding the procedure were encouraged and answered. A
timeout was performed prior to the initiation of the procedure.

[Series 1: us fna biopsy thyroid 1st lesion · 0.08mm/px · 13 acquisitions, 13 frames shown]
[im 1/13]
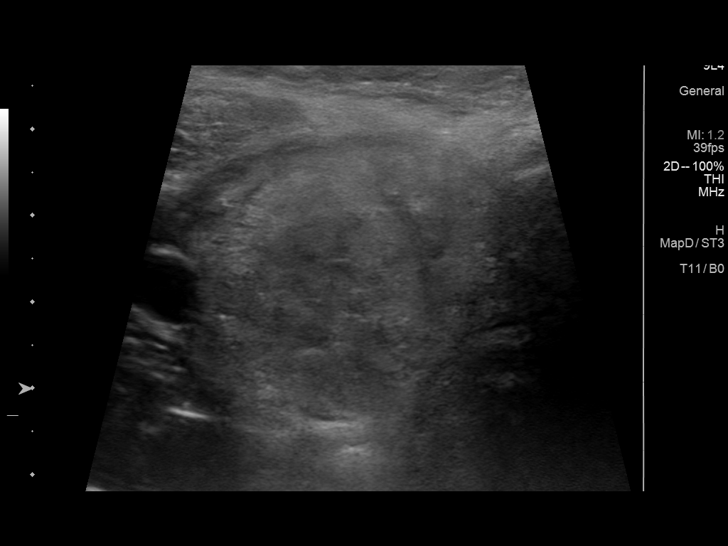
[im 2/13]
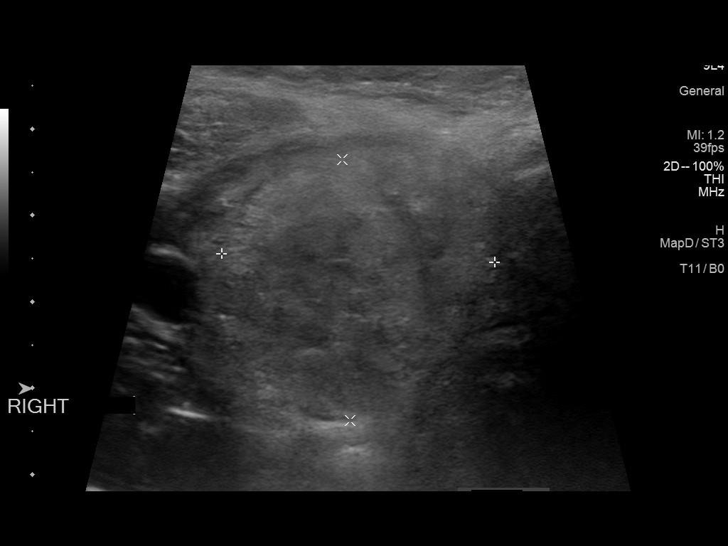
[im 3/13]
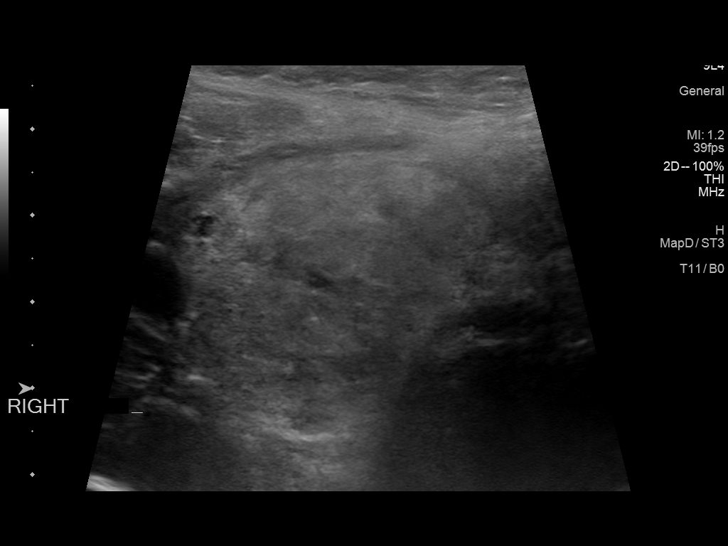
[im 4/13]
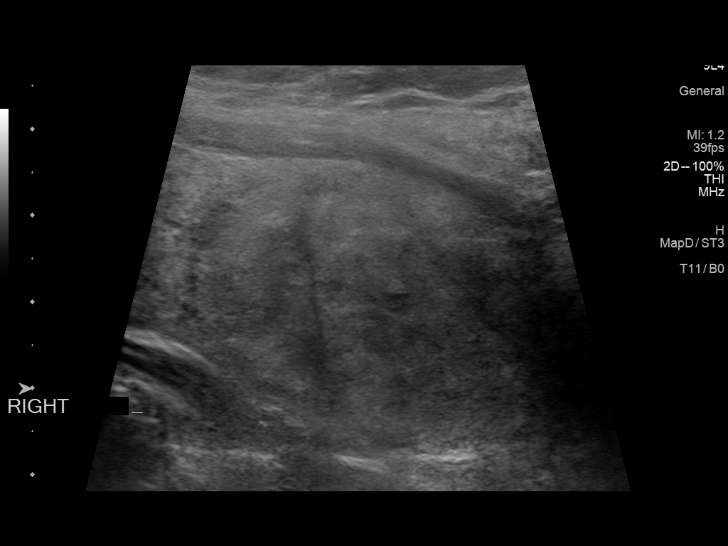
[im 5/13]
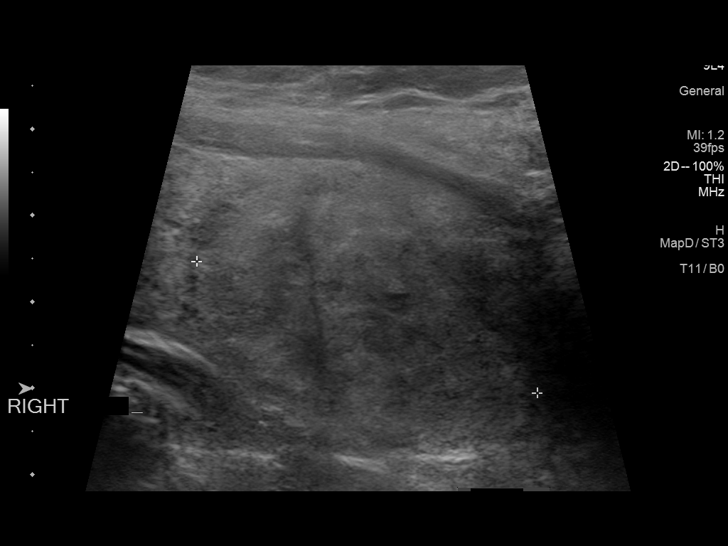
[im 6/13]
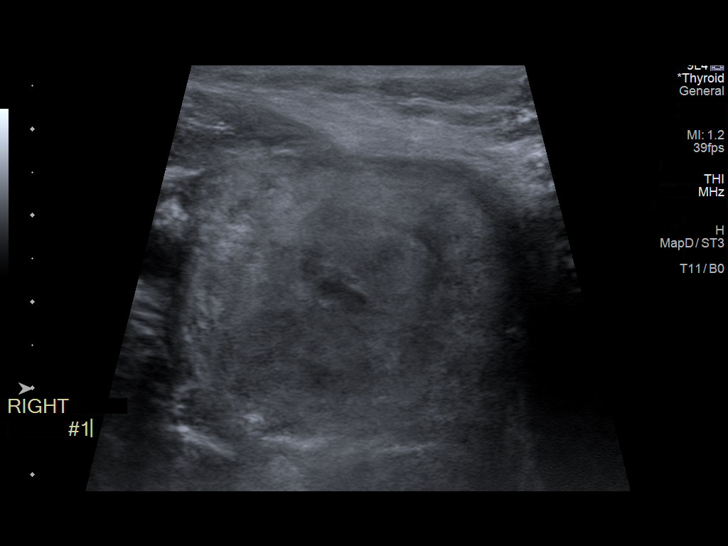
[im 7/13]
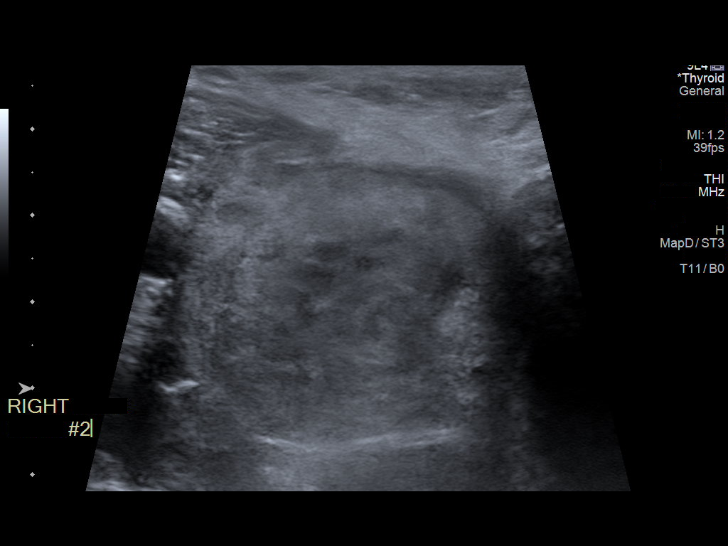
[im 8/13]
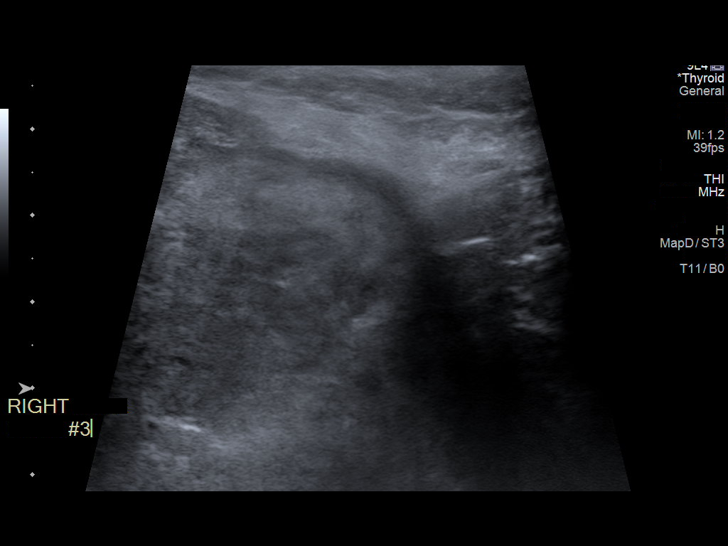
[im 9/13]
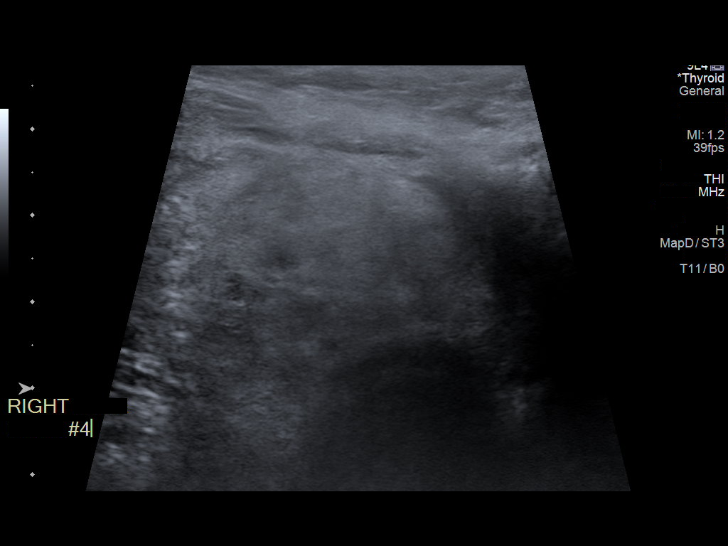
[im 10/13]
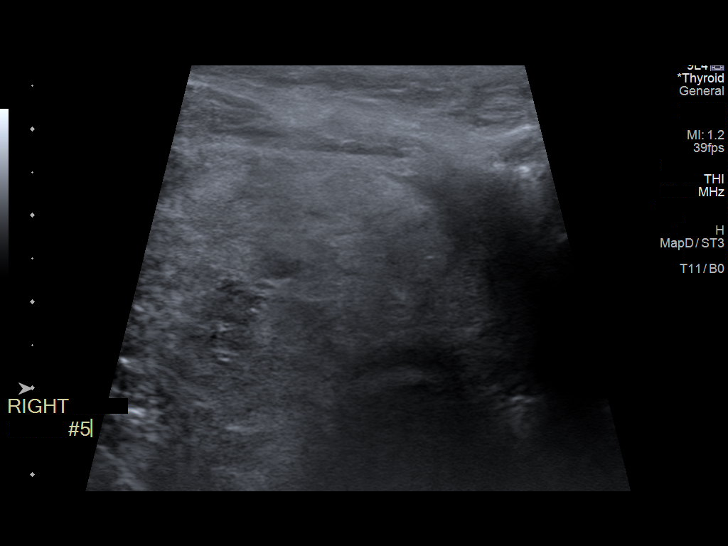
[im 11/13]
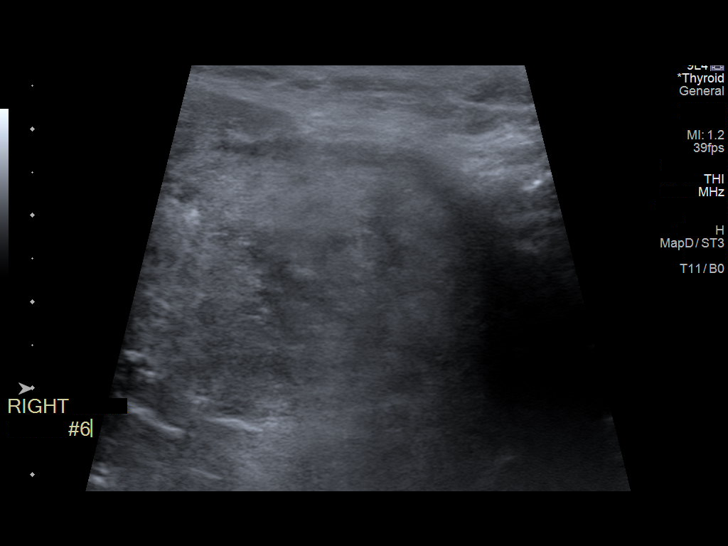
[im 12/13]
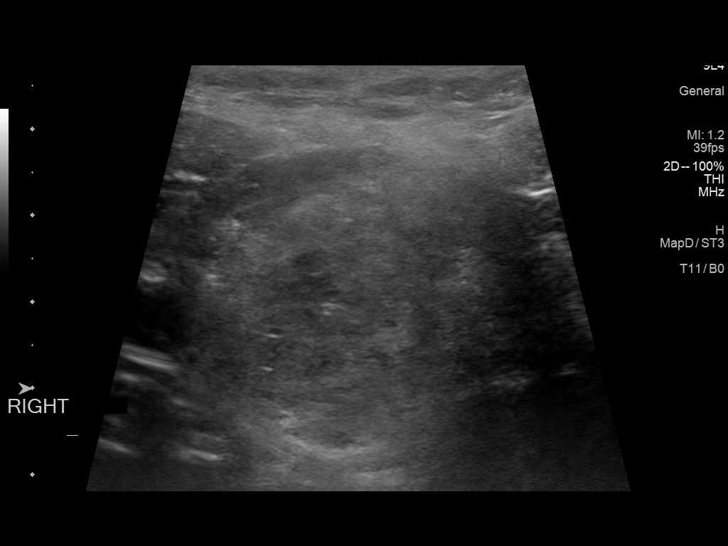
[im 13/13]
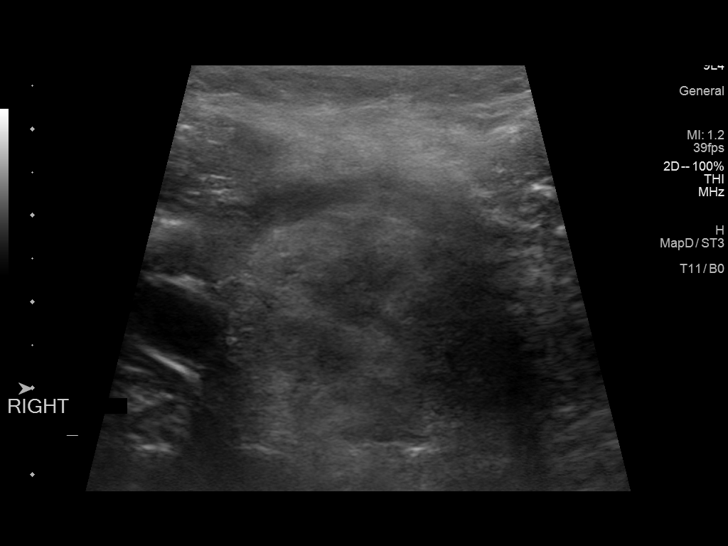

[13 of 13 positions shown; findings below may reference images not displayed]

MEDICATIONS:
1% lidocaine to skin and subcutaneous tissue

COMPLICATIONS:
None immediate.
Pre-procedural ultrasound scanning demonstrated unchanged size and
appearance of the indeterminate nodule within the right inferior
thyroid lobe

The procedure was planned. The neck was prepped in the usual sterile
fashion, and a sterile drape was applied covering the operative
field. A timeout was performed prior to the initiation of the
procedure. Local anesthesia was provided with 1% lidocaine.

Under direct ultrasound guidance, 6 FNA biopsies were performed of
the right inferior thyroid nodule with 25 gauge needles. Multiple
ultrasound images were saved for procedural documentation purposes.
The samples were prepared and submitted to pathology as well as for
Afirma testing.

Limited post procedural scanning was negative for hematoma or
additional complication. Dressings were placed. The patient
tolerated the above procedures procedure well without immediate
postprocedural complication.
FINDINGS: Nodule reference number based on prior diagnostic ultrasound: 1

Maximum size: 3.8 cm

Location: Right; Inferior

ACR TI-RADS risk category: TR3 (3 points)

Reason for biopsy: meets ACR TI-RADS criteria

Ultrasound imaging confirms appropriate placement of the needles
within the thyroid nodule.
IMPRESSION: Technically successful ultrasound guided fine needle aspiration
biopsy of right inferior thyroid nodule. Final pathology pending.

## 2022-09-12 NOTE — Progress Notes (Signed)
[facility] PREP Weekly Session  Patient Details  Name: [name] MRN: [id number] Date of Birth: 05-May-1960 Age: 62 y.o. PCP: Raymon Mutton., FNP  Vitals:   09/11/22 1030  Weight: 207 lb (93.9 kg)     [facility] Weekly seesion - 09/12/22 1100       [facility] "PREP" Location   [facility] "PREP" Location [facility]      Weekly Session   Topic Discussed Stress management and problem solving   relaxation meditation   Minutes exercised this week 175 minutes    Classes attended to date 7             Pam Jerral Bonito 09/12/2022, 11:46 AM

## 2022-09-13 ENCOUNTER — Inpatient Hospital Stay: Payer: 59 | Admitting: General Practice

## 2022-09-13 ENCOUNTER — Encounter: Payer: Self-pay | Admitting: General Practice

## 2022-09-13 NOTE — Progress Notes (Signed)
[facility] Spiritual Care Note  Missed [name] for her 1:00 appointment today. Followed up by phone, leaving voicemail with direct number in case she would like to reschedule.   [address] [name], [place], [facility] Pager [phone] Voicemail [phone]

## 2022-09-14 ENCOUNTER — Ambulatory Visit: Payer: 59

## 2022-09-20 ENCOUNTER — Ambulatory Visit: Payer: 59

## 2022-09-20 DIAGNOSIS — Z483 Aftercare following surgery for neoplasm: Secondary | ICD-10-CM

## 2022-09-20 DIAGNOSIS — I890 Lymphedema, not elsewhere classified: Secondary | ICD-10-CM

## 2022-09-20 DIAGNOSIS — R293 Abnormal posture: Secondary | ICD-10-CM

## 2022-09-20 DIAGNOSIS — C50411 Malignant neoplasm of upper-outer quadrant of right female breast: Secondary | ICD-10-CM | POA: Diagnosis not present

## 2022-09-20 DIAGNOSIS — Z171 Estrogen receptor negative status [ER-]: Secondary | ICD-10-CM

## 2022-09-20 DIAGNOSIS — M25611 Stiffness of right shoulder, not elsewhere classified: Secondary | ICD-10-CM

## 2022-09-20 NOTE — Therapy (Addendum)
 OUTPATIENT PHYSICAL THERAPY BREAST CANCER RE-EVAL   Patient Name: [name] MRN: [id number] DOB:May 22, 1960, 62 y.o., female Today's Date: 09/20/2022   PT End of Session - 09/20/22 1500     Visit Number 12    Number of Visits 15    Date for PT Re-Evaluation 10/24/22    PT Start Time 1502    PT Stop Time 1555    PT Time Calculation (min) 53 min    Activity Tolerance Patient tolerated treatment well    Behavior During Therapy [facility] for tasks assessed/performed              Past Medical History:  Diagnosis Date   Allergy    Anginal pain (HCC)    a. NL cath in 2008;  b. Myoview  03/2011: dec uptake along mid anterior wall on stress imaging -> ? attenuation vs. ischemia, EF 65%;  c. 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 2008 -> coumadin x 6 mos.   Rheumatoid arthritis (HCC)    Sleep apnea    on CPAP 02/2018   SOB (shortness of breath)    Thyroid  disease    TIA (transient ischemic attack)    Urinary urgency    Vitamin D  deficiency    Past Surgical History:  Procedure Laterality Date   ABDOMINAL HYSTERECTOMY  2005   APPENDECTOMY     BREAST LUMPECTOMY WITH RADIOACTIVE SEED AND SENTINEL LYMPH NODE BIOPSY Right 11/16/2021   Procedure: RIGHT BREAST RADIOACTIVE SEED LOCALIZED  LUMPECTOMY WITH RIGHT AXILLARY SENTINEL LYMPH NODE BIOPSY;  Surgeon: Belinda Cough, MD;  Location: [facility];  Service: General;  Laterality: Right;  LMA & PEC BLOCK   CARDIAC CATHETERIZATION     Normal   CARDIAC CATHETERIZATION N/A 09/30/2014   Procedure: Left Heart Cath and Coronary Angiography;  Surgeon: Ozell Fell, MD;  Location: [facility] INVASIVE CV LAB;  Service: Cardiovascular;  Laterality: N/A;   LAPAROSCOPIC APPENDECTOMY N/A 06/03/2012   Procedure: APPENDECTOMY LAPAROSCOPIC;  Surgeon: Jina Nephew, MD;  Location: [facility] OR;  Service: General;  Laterality: N/A;   Left knee surgery  2008   LEG SURGERY     TONSILLECTOMY     TOTAL HIP ARTHROPLASTY Left 06/11/2018   Procedure: LEFT TOTAL HIP ARTHROPLASTY ANTERIOR APPROACH;  Surgeon: Addie Cordella Hamilton, MD;  Location: [facility] OR;  Service: Orthopedics;  Laterality: Left;   TOTAL KNEE ARTHROPLASTY Left 08/22/2016   Procedure: TOTAL KNEE ARTHROPLASTY;  Surgeon: Addie Hamilton Cordella, MD;  Location: [facility] OR;  Service: Orthopedics;  Laterality: Left;   TUBAL LIGATION  1989   Patient Active Problem List   Diagnosis Date Noted   Lung nodules 07/15/2022   Heart failure (HCC) 03/25/2022   Chest pain 03/24/2022   Multiple thyroid  nodules 11/28/2021   Genetic testing 11/22/2021   Family history of breast cancer 11/03/2021   Family history of colon cancer 11/03/2021   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   Back pain 06/01/2021   Right thyroid  nodule 05/31/2021   At risk for impaired metabolic function 04/12/2020   Unilateral primary osteoarthritis, left hip    Hip arthritis 06/11/2018   Class 3 severe obesity with serious comorbidity and body mass index (BMI) of 40.0 to 44.9 in adult [facility]) 05/29/2018   Other fatigue 11/20/2017   Shortness of breath on exertion 11/20/2017   Type 2 diabetes mellitus with hyperlipidemia (HCC) 11/20/2017   Vitamin D  deficiency 11/20/2017   Depression with anxiety  11/20/2017   Other hyperlipidemia 11/20/2017   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/04/2016   Presence of left artificial knee joint 09/20/2016   Arthritis of knee 08/22/2016   Cervical radiculopathy 07/26/2016   Cervical disc disorder with radiculopathy 07/26/2016   Right arm pain 06/29/2016   Cervicalgia 06/29/2016   Primary osteoarthritis of left knee 06/29/2016   Hypersomnia with sleep apnea 11/18/2015   Lethargy 11/18/2015   Exertional chest pain 09/30/2014   Abnormal cardiac function test 09/29/2014   Chest pain with moderate risk for cardiac etiology 09/28/2014   HTN (hypertension) 09/28/2014   AKI (acute kidney injury) (HCC)    Paresthesia 06/18/2014   Numbness and tingling of left arm and leg    Unstable angina (HCC) 05/24/2014   OSA on CPAP 05/24/2014   CAD (coronary artery disease) 11/25/2012   S/P laparoscopic appendectomy 06/04/2012   Acute on chronic diastolic CHF (congestive heart failure) (HCC) 01/09/2012   Mediastinal mass 01/09/2012   SOB (shortness of breath) 06/20/2011   Mediastinal abnormality 06/20/2011   Dyslipidemia 12/23/2009   GLAUCOMA 12/23/2009   ARTHRITIS 12/23/2009   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 FNP   REFERRING PROVIDER: Dr. Belinda   REFERRING DIAG: Rt breast cancer  THERAPY DIAG:  Malignant neoplasm of upper-outer quadrant of right breast in female, estrogen receptor negative (HCC)  Lymphedema, not elsewhere classified  Aftercare following surgery for neoplasm  Stiffness of right shoulder, not elsewhere  classified  Abnormal posture  Rationale for Evaluation and Treatment Rehabilitation  ONSET DATE: 10/28/21  SUBJECTIVE:  SUBJECTIVE STATEMENT:   I got the Flexi Touch several weeks ago but they just came last Friday to set it up.  I used it 3 times. It feels good but its hard to get in and out of it all. They want me to sit up, but with all of it on I am reclined a lot. My breast has softened. The pigmentation is better. The heaviness seems to be better and its not as sore as it was. I am going to the gym and using some of the machines. I feel a little less tight in my armpit. 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 Other hx includes DM, CHF, hx of stroke, anxiety.  She completed Chemotherapy and She finished radiation 05/02/2022.  Had an axillary seroma drained x 4.  She was hospitalized Dec 1st through the 5th for fluid in her legs and lungs, and she had some PT at home after the The new bras are better.hospitalization about 2 times.  PATIENT GOALS:  Reassess how my recovery is going related to arm function, pain, and swelling.  PAIN:  Are you having pain? No not presently, but was having breast pain before the antibiotics  PRECAUTIONS: Recent Surgery, right UE Lymphedema risk, DM, CHF, hx of CVA,anxiety  ACTIVITY LEVEL / LEISURE: reading, TV    OBJECTIVE:   PATIENT SURVEYS:  QUICK DASH:not given  BREAST COMPLAINTS QUESTIONNAIRE( EVAL) Pain:2 Heaviness:6 Swollen feeling:9 Tense Skin:5 Redness:9 Bra Print:6 Size of Pores:8 Hard feeling: 8 Total:    53 /80 A Score over 9 indicates lymphedema issues in the breast 37 today (3/7/20240 BREAST COMPLAINTS QUESTIONNAIRE (06/29/2022) Pain:3 Heaviness:7 Swollen  feeling:7 Tense Skin:4 Redness:2 Bra Print:2 Size of Pores:5 Hard feeling: 2 Total:   32  /80 A Score over 9 indicates lymphedema issues in the breast  BREAST COMPLAINTS QUESTIONNAIRE (08/29/22) Pain:3 Heaviness:8 Swollen feeling:8 Tense Skin:3 Redness:3 Bra Print:2 Size of Pores:8 Hard feeling: 8 Total:  43   /80 A Score over 9 indicates lymphedema issues in the breast   OBSERVATIONS: EVAL: Large breasted with noted generalized increase in swelling on the right. Hyperpigmentation from radiation and mild skin sloughing from area in axilla that was burned. 06/29/2022 generalized swelling but improved with enlarged pores still present. Several small cords still present in axilla/upper arm but not interfering with ROM. Mild fibrosis noted mainly at medial breast. Small nodular area noted prox to nipple. Advised pt to have it checked EVAL:Several small cords noted in axilla and several larger cords noted in right breast. Fibrosis noted at proximal lateral nipple and mildly medial breast. Pt has been wearing her compression bra and it does feel better 07/26/2022; Fibrosis still noted greatest at medial breast; peach dot foam made and put in stockinette to place in bra. New bra more comfortable for pt, but rides down and doesn't cover upper breast as well. 08/29/22 Enlarged pores/ fibrosis/mild pitting, Peau' d'orange at right medial/inferior breast, continued generalized swelling right breast, upper chest  POSTURE:  Forward head and rounded shoulders posture   UPPER EXTREMITY AROM/PROM:   A/PROM RIGHT   eval 11/11/2021  01/11/22 05/17/2022 RE-eval RIGHT 06/06/2021 RIGHT 06/29/2022 RIGHT 07/26/2022  Shoulder extension 48 60 55 60  60  Shoulder flexion 132pn 135pn 119/pulls 132 132 132  Shoulder abduction 135pn 135 130/ pulls 130 pulls 134 140  Shoulder internal rotation         Shoulder external rotation 78pn in  elbow  80 89/pulls 92                            (Blank rows = not tested)    A/PROM LEFT   eval LEFT 06/06/2022  Shoulder extension 55   Shoulder flexion 153 133  Shoulder abduction 145 137  Shoulder internal rotation     Shoulder external rotation 85                           (Blank rows = not tested)     CERVICAL AROM: All within functional limits:    LYMPHEDEMA ASSESSMENTS:    LANDMARK RIGHT   eval 01/11/22 05/17/2022 RIGHT 08/29/2022  10 cm proximal to olecranon process 36 37 37.5 37.8  Olecranon process 29 29.5 29.5 29.9  10 cm proximal to ulnar styloid process 23.5 23.9 23.9 24.8  Just proximal to ulnar styloid process 17.5 17.4 17.2 17.5  Across hand at thumb web space 20.0 19.4 19.7 20.4  At base of 2nd digit 7.3 7.0 6.9 6.9  (Blank rows = not tested)   LANDMARK LEFT   eval 01/11/22 05/17/2022 LEFT 08/29/22  10 cm proximal to olecranon process 35.2 36.5 38.2 37.2  Olecranon process 30.5 30.5 29.6 30  10  cm proximal to ulnar styloid process 23.5 23.5 24.6 25.0  Just proximal to ulnar styloid process 17 17 17.7 18.3  Across hand at thumb web space 20.3 20.5 19.9 20.4  At base of 2nd digit 6.9 6.9 6.9 6.9  (Blank rows = not tested)  07/26/2022; around chest 110 cm  08/29/2022: around chest 111.3    TREATMENT TODAY  09/20/2022 In supine head of table elevated: Short neck, 5 diaphragmatic breaths, L axillary nodes and establishment of interaxillary pathway, R inguinal nodes and establishment of axilloinguinal pathway, then R breast moving fluid towards pathways spending extra time in any areas of fibrosis then retracing all steps. Discussed flexi touch and advised pt to call company as needed for adjustments or questions   08/29/2022 Pts arms and chest measured; no indication of swelling in right arm. Measured chest; increased chest measurement slightly since last measurements New peach foam pad made for right breast and covered in beige stockinette Discussed compression bras as pt is not happy with the fit of her present bra.   07/26/2022 Wall  slides flexion and abd  x 5 prior to measuring Measured right shoulder AROM Assessed right breast;fibrosis still noted especially medially Peach dot foam cut for area of fibrosis and pt assisted with placing it in bra. Pt instructed how to use MLD  to activate LN and upper pathway while using  basic pump to prevent increase in chest swelling.   06/29/2022 Discussed goals and pt filled out Breast Complaints Survey MFR to right axillary region, upper arm,  area of cording, PROM right shoulder flexion, scaption, abd, ER In supine head of table elevated: Short neck, 5 diaphragmatic breaths, L axillary nodes and establishment of interaxillary pathway, R inguinal nodes and establishment of axilloinguinal pathway, then R breast moving fluid towards pathways spending extra time in any areas of fibrosis then retracing all steps. Measured ROM  06/22/2022  MFR to right axillary region, upper arm, forearm area of cording, PROM right shoulder flexion, scaption, abd, ER In supine head of table elevated: Short neck, 5 diaphragmatic breaths, L axillary nodes and establishment of interaxillary pathway, R inguinal nodes and establishment of  axilloinguinal pathway, then R breast moving fluid towards pathways spending extra time in any areas of fibrosis then retracing all steps.   06/13/2022  MFR to right axillary region, upper arm, forearm area of cording, PROM right shoulder flexion, scaption, abd, ER In supine head of table elevated: Short neck, 5 diaphragmatic breaths, L axillary nodes and establishment of interaxillary pathway, R inguinal nodes and establishment of axilloinguinal pathway, then R breast moving fluid towards pathways spending extra time in any areas of fibrosis then retracing all steps.     05/23/2022 Foam pad in TG soft made to place under right lower border of bra to prevent irritation and rolling Supine wand flex and scaption x5 MFR to right axillary region, upper arm area of  cording PROM right shoulder flexion, scaption, abd, ER In supine head of table elevated: Short neck, 5 diaphragmatic breaths, L axillary nodes and establishment of interaxillary pathway, R inguinal nodes and establishment of axilloinguinal pathway, then R breast moving fluid towards pathways spending extra time in any areas of fibrosis then retracing all steps.Pt instructed in anatomy and physiology of lymphatic system.    PATIENT EDUCATION: 05/17/2022 (RW) Education details:reviewed 4 post op exercises given by K. Tevis on 01/11/2022, wand for flexion, wall climb, scapular retraction and stargazer in supine Person educated: Patient Education method: Explanation, Demonstration, Verbal cues, and Handouts Education comprehension: verbalized understanding and returned demonstration  HOME EXERCISE PROGRAM:  Reviewed previously given post op HEP. Access Code: [email or web address] URL: [email or web address] Date: 01/11/2022 Prepared by: Saddie Raw  Exercises - Supine Shoulder Flexion with Dowel  - 1 x daily - 7 x weekly - 1-3 sets - 10 reps - 5 seconds hold - Supine Chest Stretch with Elbows Bent  - 1 x daily - 7 x weekly - 1 sets - 3 reps - 30-60seconds hold - Standing Shoulder Abduction Slides at Wall  - 1 x daily - 7 x weekly - 1-3 sets - 5 reps - 5 seconds hold - Seated Scapular Retraction  - 1 x daily - 7 x weekly - 1-3 sets - 10 reps - 2-3 seconds hold  ASSESSMENT:  CLINICAL IMPRESSION: Pt received her Flexi Touch 2 weeks ago but it was just set up for her last week. She feels good improvement with the pump but does note some difficulty getting all the pieces on and the bulkiness of it all.  The medial aspect of her breast is softer, and the breast itself feels less heavy to pt. She does note the pores are still enlarged. Continued MLD today with softening noted at medial breast prior to treatment.  PT treatment/interventions: ADL/Self care home management, Patient/Family education, MLD,  therapeutic exercise, manual techniques, compression pump?     GOALS: Goals reviewed with patient? Yes  LONG TERM GOALS:  (STG=LTG)  GOALS Name Target Date  Goal status  1 Pt will demonstrate she has regained full shoulder ROM and function post operatively compared to baselines.  Baseline:  06/28/2022  MET 06/29/2022  2 Pt will have decreased right breast swelling/heaviness by 50% or more 06/28/2022 MET  06/29/2022  3 Pt will be independent in self MLD of the right breast 06/28/2022 MET 06/29/2022  4 Breast Complaints survey will be decreased to no greater than 15 to show improvement in symptoms 08/23/2022/ 10/24/2022 IN Progress 43 today     PLAN: PT FREQUENCY/DURATION:  4 visits in 8 weeks prn, pt to schedule prn.  PLAN FOR NEXT SESSION: reassess  right breast , review MLD prn,  benefit of pump, breast complaints survery  [facility]  [address]  [place] [address]  [phone] PHYSICAL THERAPY DISCHARGE SUMMARY  Visits from Start of Care: 12  Current functional level related to goals / functional outcomes: Achieved 3/4 goals established   Remaining deficits: Did not achieve goal for breast complaints questionairre   Education / Equipment: Received Flexitouch, educated in self MLD to the breast   Patient agrees to discharge. Patient goals were partially met. Patient is being discharged due to not returning since the last visit.   Grayce JINNY Sheldon, PT 09/20/2022, 4:35 PM

## 2022-09-23 ENCOUNTER — Other Ambulatory Visit (INDEPENDENT_AMBULATORY_CARE_PROVIDER_SITE_OTHER): Payer: Self-pay | Admitting: Family Medicine

## 2022-09-23 DIAGNOSIS — E559 Vitamin D deficiency, unspecified: Secondary | ICD-10-CM

## 2022-09-25 ENCOUNTER — Encounter: Payer: Self-pay | Admitting: Adult Health

## 2022-09-25 ENCOUNTER — Ambulatory Visit (INDEPENDENT_AMBULATORY_CARE_PROVIDER_SITE_OTHER): Payer: 59 | Admitting: Adult Health

## 2022-09-25 VITALS — BP 106/60 | HR 75 | Ht <= 58 in | Wt 208.0 lb

## 2022-09-25 DIAGNOSIS — G4733 Obstructive sleep apnea (adult) (pediatric): Secondary | ICD-10-CM

## 2022-09-25 NOTE — Progress Notes (Signed)
PATIENT: [name] DOB: 03-20-1961  REASON FOR VISIT: follow up HISTORY FROM: patient PRIMARY NEUROLOGIST: Dr.  Antony Contras Complaint  Patient presents with   Follow-up    Pt in 19 alone Pt here for CPAP f/u Pt states no questions or concerns for today's visit      HISTORY OF PRESENT ILLNESS: Today 09/25/22:  [name] is a 62 y.o. female with a history of OSA on CPAP. Returns today for follow-up.       03/20/22: [name] is a 62 year old female with a history of obstructive sleep apnea on CPAP.  She returns today for follow-up.  She reports that the CPAP is working well for her.  She does state that 3 weeks ago started having nose bleeds every morning. PCP told her it was her CPAP but she didn't use the CPAP for two nights but still woke up with blood in her nose.  She states that she never has blood running from her nose but does notice blood when she blows her nose in the mornings.      REVIEW OF SYSTEMS: Out of a complete 14 system review of symptoms, the patient complains only of the following symptoms, and all other reviewed systems are negative.  ESS 3   ALLERGIES: Allergies  Allergen Reactions   Hydrocodone-Acetaminophen Itching and Other (See Comments)    confusion   Penicillins Swelling and Other (See Comments)    Has patient had a PCN reaction causing immediate rash, facial/tongue/throat swelling, SOB or lightheadedness with hypotension: YES Has patient had a PCN reaction causing severe rash involving mucus membranes or skin necrosis: NO Has patient had a PCN reaction that required hospitalization NO Has patient had a PCN reaction occurring within the last 10 years: NO If all of the above answers are "NO", then may proceed with Cephalosporin use.   Shellfish Allergy Hives   Latex Rash   Sulfa Antibiotics Diarrhea, Itching and Rash    HOME MEDICATIONS: Outpatient Medications Prior to Visit  Medication Sig Dispense Refill    ACCU-CHEK GUIDE test strip      acetaminophen (TYLENOL) 500 MG tablet Take 1,000 mg by mouth 2 (two) times daily as needed for moderate pain.     albuterol (PROVENTIL HFA;VENTOLIN HFA) 108 (90 Base) MCG/ACT inhaler Inhale 2 puffs into the lungs every 6 (six) hours as needed for wheezing or shortness of breath.     aspirin 81 MG chewable tablet Chew 81 mg by mouth daily.     atorvastatin (LIPITOR) 40 MG tablet Take 40 mg by mouth at bedtime.      busPIRone (BUSPAR) 30 MG tablet Take 30 mg by mouth 2 (two) times daily.     carvedilol (COREG) 6.25 MG tablet Take 1 tablet (6.25 mg total) by mouth 2 (two) times daily. 180 tablet 3   diclofenac Sodium (VOLTAREN) 1 % GEL Apply 2 g topically 4 (four) times daily as needed (joint/muscle pain).     empagliflozin (JARDIANCE) 10 MG TABS tablet Take 1 tablet (10 mg total) by mouth daily. 30 tablet 0   EPINEPHrine 0.3 mg/0.3 mL IJ SOAJ injection Inject 0.3 mg into the muscle as needed for anaphylaxis. Follow package instructions as needed for severe allergy or anaphylactic reaction. 2 each 3   hydrOXYzine (VISTARIL) 25 MG capsule Take 25 mg by mouth 3 (three) times daily as needed for itching.     lidocaine (LIDODERM) 5 % Place 1 patch onto the skin daily as needed for  pain.     lidocaine-prilocaine (EMLA) cream Apply to affected area once (Patient taking differently: Apply 1 Application topically once. Apply to affected area once) 30 g 3   loperamide (IMODIUM) 2 MG capsule Take 1 capsule (2 mg total) by mouth 4 (four) times daily as needed for diarrhea or loose stools. 12 capsule 0   metFORMIN (GLUCOPHAGE) 500 MG tablet Take 1 tablet (500 mg total) by mouth daily with breakfast. 30 tablet 0   nitroGLYCERIN (NITROSTAT) 0.4 MG SL tablet Place 1 tablet (0.4 mg total) under the tongue every 5 (five) minutes as needed for chest pain. 25 tablet 3   nystatin (MYCOSTATIN/NYSTOP) powder Apply 1 application topically 3 (three) times daily as needed for rash.      ondansetron (ZOFRAN-ODT) 4 MG disintegrating tablet Take 1 tablet (4 mg total) by mouth every 8 (eight) hours as needed for nausea or vomiting. 20 tablet 0   pantoprazole (PROTONIX) 40 MG tablet Take 1 tablet (40 mg total) by mouth 2 (two) times daily. (Patient taking differently: Take 40 mg by mouth daily.) 60 tablet 0   prochlorperazine (COMPAZINE) 10 MG tablet Take 1 tablet (10 mg total) by mouth every 6 (six) hours as needed for nausea or vomiting. 30 tablet 1   sertraline (ZOLOFT) 25 MG tablet Take 1 tablet (25 mg total) by mouth daily. 90 tablet 0   Vitamin D, Ergocalciferol, (DRISDOL) 1.25 MG (50000 UNIT) CAPS capsule Take 1 capsule (50,000 Units total) by mouth every 7 (seven) days. 4 capsule 0   furosemide (LASIX) 40 MG tablet Take 1 tablet (40 mg total) by mouth daily as needed for edema or fluid (take in case of weight gain 2 to 3 lbs in 24 hrs or 5 lbs in 7 days.). 30 tablet 0   losartan (COZAAR) 25 MG tablet Take 1 tablet (25 mg total) by mouth daily. 30 tablet 0   spironolactone (ALDACTONE) 25 MG tablet Take 1/2 tablet (12.5 mg total) by mouth daily. (Patient taking differently: Take 25 mg by mouth every other day.) 15 tablet 0   No facility-administered medications prior to visit.    PAST MEDICAL HISTORY: Past Medical History:  Diagnosis Date   Allergy    Anginal pain (HCC)    a. NL cath in 2008;  b. Myoview 03/2011: dec uptake along mid anterior wall on stress imaging -> ? attenuation vs. ischemia, EF 65%;  c. 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 2008 -> coumadin x 6 mos.   Rheumatoid arthritis (HCC)    Sleep apnea    on CPAP 02/2018   SOB (shortness of breath)    Thyroid disease    TIA (transient ischemic attack)    Urinary urgency    Vitamin D deficiency     PAST SURGICAL HISTORY: Past Surgical History:  Procedure Laterality Date   ABDOMINAL HYSTERECTOMY  2005   APPENDECTOMY     BREAST LUMPECTOMY WITH RADIOACTIVE SEED AND SENTINEL LYMPH NODE BIOPSY Right 11/16/2021   Procedure: RIGHT BREAST RADIOACTIVE SEED LOCALIZED LUMPECTOMY WITH RIGHT AXILLARY SENTINEL LYMPH NODE BIOPSY;  Surgeon: Manus Rudd, MD;  Location: [facility];  Service: General;  Laterality: Right;  LMA & PEC BLOCK   CARDIAC CATHETERIZATION     Normal   CARDIAC CATHETERIZATION N/A 09/30/2014   Procedure: Left Heart Cath and Coronary Angiography;  Surgeon: Tonny Bollman, MD;  Location: [facility] INVASIVE CV LAB;  Service: Cardiovascular;  Laterality: N/A;   LAPAROSCOPIC APPENDECTOMY N/A 06/03/2012   Procedure: APPENDECTOMY LAPAROSCOPIC;  Surgeon: Almond Lint, MD;  Location: [facility] OR;  Service: General;  Laterality: N/A;   Left knee surgery  2008   LEG SURGERY     TONSILLECTOMY     TOTAL HIP ARTHROPLASTY Left 06/11/2018   Procedure: LEFT TOTAL HIP ARTHROPLASTY ANTERIOR APPROACH;  Surgeon: Cammy Copa, MD;  Location: [facility] OR;  Service: Orthopedics;  Laterality: Left;   TOTAL KNEE ARTHROPLASTY Left 08/22/2016   Procedure: TOTAL KNEE ARTHROPLASTY;  Surgeon: Cammy Copa, MD;  [facility];  Service: Orthopedics;  Laterality: Left;   TUBAL LIGATION  1989    FAMILY HISTORY: Family History  Problem Relation Age of Onset   Emphysema Mother    Arthritis Mother    Heart failure  Mother        alive @ 47   Stroke Mother    Diabetes Mother    Hypertension Mother    Hyperlipidemia Mother    Depression Mother    Anxiety disorder Mother    Heart disease Mother    Alcoholism Mother    Depression Father    Heart disease Father        died @ 51's.   Stroke Father    Diabetes Father    Hyperlipidemia Father    Hypertension Father    Bipolar disorder Father    Sleep apnea Father    Alcoholism Father    Drug abuse Father    Sudden death Father    Diabetes Sister    Asthma Brother    Breast cancer Maternal Aunt 24 - 80   Colon cancer Maternal Grandfather        dx after 50   Heart disease Paternal Grandfather    Breast cancer Cousin        dx 36s   Esophageal cancer Neg Hx    Stomach cancer Neg Hx    Rectal cancer Neg Hx     SOCIAL HISTORY: Social History   Socioeconomic History   Marital status: Divorced    Spouse name: Not on file   Number of children: 3   Years of education: Not on file   Highest education level: Not on file  Occupational History   Occupation: UNEMPLOYED    Employer: DISABLED  Tobacco Use   Smoking status: Former    Packs/day: 0.50    Years: 15.00    Additional pack years: 0.00    Total pack years: 7.50  Types: Cigarettes    Quit date: 04/24/1985    Years since quitting: 37.4   Smokeless tobacco: Never  Vaping Use   Vaping Use: Never used  Substance and Sexual Activity   Alcohol use: No    Alcohol/week: 0.0 standard drinks of alcohol   Drug use: Not Currently    Types: Marijuana   Sexual activity: Not Currently    Birth control/protection: Surgical    Comment: hyst  Other Topics Concern   Not on file  Social History Narrative   Lives in [place] by herself.  Disabled.  Occasional Tea .  3 kids, 6 grandkids.     Water aerobics 3 times a week.   Social Determinants of Health   Financial Resource Strain: High Risk (03/27/2022)   Overall Financial Resource Strain (CARDIA)    Difficulty of Paying Living Expenses: Very  hard  Food Insecurity: Food Insecurity Present (03/27/2022)   Hunger Vital Sign    Worried About Running Out of Food in the Last Year: Sometimes true    Ran Out of Food in the Last Year: Sometimes true  Transportation Needs: No Transportation Needs (03/27/2022)   PRAPARE - Administrator, Civil Service (Medical): No    Lack of Transportation (Non-Medical): No  Physical Activity: Not on file  Stress: Not on file  Social Connections: Not on file  Intimate Partner Violence: Not At Risk (03/28/2022)   Humiliation, Afraid, Rape, and Kick questionnaire    Fear of Current or Ex-Partner: No    Emotionally Abused: No    Physically Abused: No    Sexually Abused: No      PHYSICAL EXAM  There were no vitals filed for this visit.  There is no height or weight on file to calculate BMI.  Generalized: Well developed, in no acute distress  Chest: Lungs clear to auscultation bilaterally  Neurological examination  Mentation: Alert oriented to time, place, history taking. Follows all commands speech and language fluent Cranial nerve II-XII: Extraocular movements were full, visual field were full on confrontational test Head turning and shoulder shrug  were normal and symmetric. Motor: The motor testing reveals 5 over 5 strength of all 4 extremities. Good symmetric motor tone is noted throughout.  Sensory: Sensory testing is intact to soft touch on all 4 extremities. No evidence of extinction is noted.  Gait and station: Gait is normal.    DIAGNOSTIC DATA (LABS, IMAGING, TESTING) - I reviewed patient records, labs, notes, testing and imaging myself where available.  Lab Results  Component Value Date   WBC 3.7 (L) 07/16/2022   HGB 13.1 07/16/2022   HCT 41.3 07/16/2022   MCV 87.5 07/16/2022   PLT 165 07/16/2022      Component Value Date/Time   NA 140 07/16/2022 0145   NA 142 04/18/2022 1124   K 4.2 07/16/2022 0145   CL 107 07/16/2022 0145   CO2 22 07/16/2022 0145   GLUCOSE  127 (H) 07/16/2022 0145   BUN 14 07/16/2022 0145   BUN 13 04/18/2022 1124   CREATININE 0.97 07/16/2022 0145   CREATININE 0.85 03/03/2022 1250   CALCIUM 8.9 07/16/2022 0145   PROT 6.1 (L) 07/16/2022 0145   PROT 6.6 09/28/2021 0951   ALBUMIN 3.2 (L) 07/16/2022 0145   ALBUMIN 4.3 09/28/2021 0951   AST 21 07/16/2022 0145   AST 14 (L) 03/03/2022 1250   ALT 26 07/16/2022 0145   ALT 18 03/03/2022 1250   ALKPHOS 51 07/16/2022 0145   BILITOT 0.2 (  L) 07/16/2022 0145   BILITOT 0.5 03/03/2022 1250   GFRNONAA >60 07/16/2022 0145   GFRNONAA >60 03/03/2022 1250   GFRAA 76 02/03/2020 0845   Lab Results  Component Value Date   CHOL 127 07/16/2022   HDL 48 07/16/2022   LDLCALC 55 07/16/2022   TRIG 119 07/16/2022   CHOLHDL 2.6 07/16/2022   Lab Results  Component Value Date   HGBA1C 7.3 (H) 07/15/2022   Lab Results  Component Value Date   VITAMINB12 457 02/03/2020   Lab Results  Component Value Date   TSH 0.467 03/24/2022      ASSESSMENT AND PLAN 62 y.o. year old female  has a past medical history of Allergy, Anginal pain (HCC), Anxiety, Arthritis, Asthma, Back pain, Bone cancer (HCC), Cancer (HCC), Chest pain, CHF (congestive heart failure) (HCC), Clotting disorder (HCC), Constipation, Depression, Diabetes mellitus, Drug use, Dyspnea, Frequent urination, GERD (gastroesophageal reflux disease), Glaucoma, History of stomach ulcers, HLD (hyperlipidemia), Hypertension, IBS (irritable bowel syndrome), Joint pain, Joint pain, Lactose intolerance, Leg edema, Mediastinal mass, Neuromuscular disorder (HCC), Obesity, Palpitations, Pneumonia (05/2016), Pulmonary edema, Pulmonary embolism (HCC), Rheumatoid arthritis (HCC), Sleep apnea, SOB (shortness of breath), Thyroid disease, TIA (transient ischemic attack), Urinary urgency, and Vitamin D deficiency. here with:  OSA on CPAP  - CPAP compliance excellent - Good treatment of AHI  - Encourage patient to use CPAP nightly and > 4 hours each  night. - F/U in 1 year or sooner if needed     Butch Penny, MSN, NP-C 09/25/2022, 1:18 PM [facility] [address] [place], [place] [address] [phone]

## 2022-09-25 NOTE — Patient Instructions (Signed)
Continue using CPAP nightly and greater than 4 hours each night °If your symptoms worsen or you develop new symptoms please let us know.  ° °

## 2022-09-26 ENCOUNTER — Other Ambulatory Visit: Payer: Self-pay

## 2022-09-26 NOTE — Progress Notes (Signed)
[facility] PREP Weekly Session  Patient Details  Name: [name] MRN: [id number] Date of Birth: 1960-10-24 Age: 62 y.o. PCP: Raymon Mutton., FNP  Vitals:   09/25/22 1032  Weight: 208 lb 1.6 oz (94.4 kg)     [facility] Weekly seesion - 09/26/22 1000       [facility] "PREP" Location   [facility] "PREP" Location [facility]      Weekly Session   Topic Discussed Expectations and non-scale victories    Classes attended to date 11            Pt has requested to stop PREP. Currently undergoing therapy and the appts are too much. Doesn't not know end date for therapy.  Asked that pt contact me when she is ready to exercise.   Bonnye Fava 09/26/2022, 10:24 AM

## 2022-09-27 ENCOUNTER — Inpatient Hospital Stay: Payer: 59 | Attending: Hematology and Oncology | Admitting: General Practice

## 2022-09-27 ENCOUNTER — Other Ambulatory Visit: Payer: Self-pay

## 2022-09-28 ENCOUNTER — Ambulatory Visit (INDEPENDENT_AMBULATORY_CARE_PROVIDER_SITE_OTHER): Payer: 59 | Admitting: Adult Health

## 2022-09-28 ENCOUNTER — Encounter: Payer: Self-pay | Admitting: Hematology and Oncology

## 2022-09-28 ENCOUNTER — Encounter (INDEPENDENT_AMBULATORY_CARE_PROVIDER_SITE_OTHER): Payer: Self-pay | Admitting: Adult Health

## 2022-09-28 DIAGNOSIS — E559 Vitamin D deficiency, unspecified: Secondary | ICD-10-CM | POA: Diagnosis not present

## 2022-09-28 DIAGNOSIS — F419 Anxiety disorder, unspecified: Secondary | ICD-10-CM | POA: Diagnosis not present

## 2022-09-28 DIAGNOSIS — F32A Depression, unspecified: Secondary | ICD-10-CM

## 2022-09-28 DIAGNOSIS — Z7984 Long term (current) use of oral hypoglycemic drugs: Secondary | ICD-10-CM

## 2022-09-28 DIAGNOSIS — Z6841 Body Mass Index (BMI) 40.0 and over, adult: Secondary | ICD-10-CM

## 2022-09-28 DIAGNOSIS — E669 Obesity, unspecified: Secondary | ICD-10-CM

## 2022-09-28 DIAGNOSIS — E1165 Type 2 diabetes mellitus with hyperglycemia: Secondary | ICD-10-CM | POA: Diagnosis not present

## 2022-09-28 MED ORDER — VITAMIN D (ERGOCALCIFEROL) 1.25 MG (50000 UNIT) PO CAPS: 50000.0000 [IU] | ORAL_CAPSULE | ORAL | 0 refills | Status: DC

## 2022-09-28 NOTE — Progress Notes (Signed)
[facility] Spiritual Care Note  Met with [name] in Spiritual Care office as she shared and processed several personal matters. Provided reflective listening and emotional support.  We plan to follow up on Thursday, June 13 at 1:30pm. One goal for that time is to discuss community resources for ongoing support, such as a counseling referral.   Caro Hight, MDiv, [facility] Pager [phone] Voicemail [phone]

## 2022-09-28 NOTE — Progress Notes (Signed)
WEIGHT SUMMARY AND BIOMETRICS  Vitals Temp: 98.3 F (36.8 C) BP: 119/78 Pulse Rate: 77 SpO2: 98 %   Anthropometric Measurements Height: 4\' 11"  (1.499 m) Weight: 206 lb (93.4 kg) BMI (Calculated): 41.58 Weight at Last Visit: 207 lb Weight Lost Since Last Visit: 1 lb Weight Gained Since Last Visit: 0 lb Starting Weight: 209 lb Total Weight Loss (lbs): 3 lb (1.361 kg)   Body Composition  Body Fat %: 49.9 % Fat Mass (lbs): 102.8 lbs Muscle Mass (lbs): 98 lbs Total Body Water (lbs): 78 lbs Visceral Fat Rating : 17   Other Clinical Data Fasting: No Labs: No Today's Visit #: 13 Starting Date: 06/01/21    Chief Complaint:   OBESITY [name] is here to discuss her progress with her obesity treatment plan. She is on the the Category 2 Plan and states she is following her eating plan approximately 80 % of the time. She states she is exercising Water Walking 20 minutes 4 times per week.   Interim History:  [facility] Goals: 1) Continue to loss weight 2) Increase frequency and intensity of exercise 3) Decrease number of Rx medications 4) Increase energy levels/stamina  Hydration-she estimates to drink 3-4 20 oz water bottles/day  Subjective:   1. Vitamin D deficiency  Latest Reference Range & Units 08/14/22 12:44  Vitamin D, 25-Hydroxy 30.0 - 100.0 ng/mL 30.3  He is on weekly Ergocalciferol- denies N/V/Muscle Weakness  2. T2D Lab Results  Component Value Date   HGBA1C 7.3 (H) 07/15/2022   HGBA1C 6.4 (H) 09/28/2021   HGBA1C 8.4 (H) 03/08/2021  [name] reports fasting CBG 110s-120s She denies sx's of hypoglycemia She is currently on daily Jardiance 10mg  and Metformin 500mg  Per pt she experienced abdominal bloating with Ozempic therapy- last used Fall 2023. She prefers to remain off GLP-1 therapy.  2. Anxiety and depression She reports stable mood, denies SI/HI She is on daily Sertraline 25mg  QD She is working with established  therapist   Assessment/Plan:   1. Vitamin D deficiency Refill - Vitamin D, Ergocalciferol, (DRISDOL) 1.25 MG (50000 UNIT) CAPS capsule; Take 1 capsule (50,000 Units total) by mouth every 7 (seven) days.  Dispense: 4 capsule; Refill: 0  2. T2D Continue daily Jardiance 10mg  and Metformin 500mg  Increase daily protein and continue regular exercise  3. Anxiety and depression Continue daily Sertraline 25mg  QD  4. Obesity with starting BMI of 42.4  [name] is currently in the action stage of change. As such, her goal is to continue with weight loss efforts. She has agreed to the Category 2 Plan.   Exercise goals: All adults should avoid inactivity. Some physical activity is better than none, and adults who participate in any amount of physical activity gain some health benefits. Adults should also include muscle-strengthening activities that involve all major muscle groups on 2 or more days a week.  Behavioral modification strategies: increasing lean protein intake, decreasing simple carbohydrates, increasing vegetables, increasing water intake, no skipping meals, meal planning and cooking strategies, keeping healthy foods in the home, better snacking choices, and planning for success.  [name] has agreed to follow-up with our clinic in 4 weeks. She was informed of the importance of frequent follow-up visits to maximize her success with intensive lifestyle modifications for her multiple health conditions.   Objective:   Blood pressure 119/78, pulse 77, temperature 98.3 F (36.8 C), height 4\' 11"  (1.499 m), weight 206 lb (93.4 kg), SpO2 98 %. Body mass index is 41.61 kg/m.  General: Cooperative, alert, well developed, in no acute distress. HEENT: Conjunctivae and lids unremarkable. Cardiovascular: Regular rhythm.  Lungs: Normal work of breathing. Neurologic: No focal deficits.   Lab Results  Component Value Date   CREATININE 0.97 07/16/2022   BUN 14 07/16/2022   NA 140 07/16/2022    K 4.2 07/16/2022   CL 107 07/16/2022   CO2 22 07/16/2022   Lab Results  Component Value Date   ALT 26 07/16/2022   AST 21 07/16/2022   ALKPHOS 51 07/16/2022   BILITOT 0.2 (L) 07/16/2022   Lab Results  Component Value Date   HGBA1C 7.3 (H) 07/15/2022   HGBA1C 6.4 (H) 09/28/2021   HGBA1C 8.4 (H) 03/08/2021   HGBA1C 7.6 (H) 02/03/2020   HGBA1C 7.5 (H) 10/07/2019   Lab Results  Component Value Date   INSULIN 13.4 08/14/2022   INSULIN 22.1 09/28/2021   INSULIN 26.8 (H) 06/01/2021   INSULIN 44.4 (H) 02/03/2020   INSULIN 28.1 (H) 12/26/2018   Lab Results  Component Value Date   TSH 0.467 03/24/2022   Lab Results  Component Value Date   CHOL 127 07/16/2022   HDL 48 07/16/2022   LDLCALC 55 07/16/2022   TRIG 119 07/16/2022   CHOLHDL 2.6 07/16/2022   Lab Results  Component Value Date   VD25OH 30.3 08/14/2022   VD25OH 39.8 09/28/2021   VD25OH 30.0 06/01/2021   Lab Results  Component Value Date   WBC 3.7 (L) 07/16/2022   HGB 13.1 07/16/2022   HCT 41.3 07/16/2022   MCV 87.5 07/16/2022   PLT 165 07/16/2022   No results found for: "IRON", "TIBC", "FERRITIN"  Attestation Statements:   Reviewed by clinician on day of visit: allergies, medications, problem list, medical history, surgical history, family history, social history, and previous encounter notes.  I have reviewed the above documentation for accuracy and completeness, and I agree with the above. -  Makiah Foye d. Mandalyn Pasqua, NP-C

## 2022-10-02 ENCOUNTER — Ambulatory Visit (INDEPENDENT_AMBULATORY_CARE_PROVIDER_SITE_OTHER): Payer: 59 | Admitting: Family Medicine

## 2022-10-05 ENCOUNTER — Other Ambulatory Visit (INDEPENDENT_AMBULATORY_CARE_PROVIDER_SITE_OTHER): Payer: Self-pay | Admitting: Adult Health

## 2022-10-05 DIAGNOSIS — E559 Vitamin D deficiency, unspecified: Secondary | ICD-10-CM

## 2022-10-15 ENCOUNTER — Encounter (HOSPITAL_COMMUNITY): Payer: Self-pay | Admitting: Emergency Medicine

## 2022-10-15 ENCOUNTER — Emergency Department (HOSPITAL_COMMUNITY)
Admission: EM | Admit: 2022-10-15 | Discharge: 2022-10-15 | Disposition: A | Discharge status: Home / Self Care | Payer: 59 | Attending: Emergency Medicine | Admitting: Emergency Medicine

## 2022-10-15 ENCOUNTER — Emergency Department (HOSPITAL_COMMUNITY): Payer: 59

## 2022-10-15 ENCOUNTER — Other Ambulatory Visit: Payer: Self-pay

## 2022-10-15 DIAGNOSIS — M79602 Pain in left arm: Secondary | ICD-10-CM | POA: Diagnosis not present

## 2022-10-15 DIAGNOSIS — R110 Nausea: Secondary | ICD-10-CM | POA: Diagnosis not present

## 2022-10-15 DIAGNOSIS — I110 Hypertensive heart disease with heart failure: Secondary | ICD-10-CM | POA: Diagnosis not present

## 2022-10-15 DIAGNOSIS — Z859 Personal history of malignant neoplasm, unspecified: Secondary | ICD-10-CM | POA: Diagnosis not present

## 2022-10-15 DIAGNOSIS — Z79899 Other long term (current) drug therapy: Secondary | ICD-10-CM | POA: Diagnosis not present

## 2022-10-15 DIAGNOSIS — R0789 Other chest pain: Secondary | ICD-10-CM | POA: Diagnosis present

## 2022-10-15 DIAGNOSIS — I509 Heart failure, unspecified: Secondary | ICD-10-CM | POA: Diagnosis not present

## 2022-10-15 DIAGNOSIS — J45909 Unspecified asthma, uncomplicated: Secondary | ICD-10-CM | POA: Diagnosis not present

## 2022-10-15 DIAGNOSIS — Z7982 Long term (current) use of aspirin: Secondary | ICD-10-CM | POA: Diagnosis not present

## 2022-10-15 DIAGNOSIS — Z91040 Latex allergy status: Secondary | ICD-10-CM | POA: Insufficient documentation

## 2022-10-15 DIAGNOSIS — Z7951 Long term (current) use of inhaled steroids: Secondary | ICD-10-CM | POA: Diagnosis not present

## 2022-10-15 LAB — CBC WITH DIFFERENTIAL/PLATELET
Abs Immature Granulocytes: 0.01 10*3/uL (ref 0.00–0.07)
Basophils Absolute: 0 10*3/uL (ref 0.0–0.1)
Basophils Relative: 1 %
Eosinophils Absolute: 0.1 10*3/uL (ref 0.0–0.5)
Eosinophils Relative: 3 %
HCT: 41.4 % (ref 36.0–46.0)
Hemoglobin: 13.2 g/dL (ref 12.0–15.0)
Immature Granulocytes: 0 %
Lymphocytes Relative: 38 %
Lymphs Abs: 1.7 10*3/uL (ref 0.7–4.0)
MCH: 28.5 pg (ref 26.0–34.0)
MCHC: 31.9 g/dL (ref 30.0–36.0)
MCV: 89.4 fL (ref 80.0–100.0)
Monocytes Absolute: 0.4 10*3/uL (ref 0.1–1.0)
Monocytes Relative: 8 %
Neutro Abs: 2.2 10*3/uL (ref 1.7–7.7)
Neutrophils Relative %: 50 %
Platelets: 170 10*3/uL (ref 150–400)
RBC: 4.63 MIL/uL (ref 3.87–5.11)
RDW: 14 % (ref 11.5–15.5)
WBC: 4.5 10*3/uL (ref 4.0–10.5)
nRBC: 0 % (ref 0.0–0.2)

## 2022-10-15 LAB — COMPREHENSIVE METABOLIC PANEL
ALT: 24 U/L (ref 0–44)
AST: 19 U/L (ref 15–41)
Albumin: 3.4 g/dL — ABNORMAL LOW (ref 3.5–5.0)
Alkaline Phosphatase: 58 U/L (ref 38–126)
Anion gap: 7 (ref 5–15)
BUN: 10 mg/dL (ref 8–23)
CO2: 25 mmol/L (ref 22–32)
Calcium: 8.9 mg/dL (ref 8.9–10.3)
Chloride: 107 mmol/L (ref 98–111)
Creatinine, Ser: 0.94 mg/dL (ref 0.44–1.00)
GFR, Estimated: >60 mL/min (ref >60)
Glucose, Bld: 137 mg/dL — ABNORMAL HIGH (ref 70–99)
Potassium: 3.7 mmol/L (ref 3.5–5.1)
Sodium: 139 mmol/L (ref 135–145)
Total Bilirubin: 0.6 mg/dL (ref 0.3–1.2)
Total Protein: 6.1 g/dL — ABNORMAL LOW (ref 6.5–8.1)

## 2022-10-15 LAB — TROPONIN I (HIGH SENSITIVITY)
Troponin I (High Sensitivity): 7 ng/L (ref <18)
Troponin I (High Sensitivity): 7 ng/L (ref <18)

## 2022-10-15 LAB — CBG MONITORING, ED: Glucose-Capillary: 128 mg/dL — ABNORMAL HIGH (ref 70–99)

## 2022-10-15 MED ORDER — ONDANSETRON 4 MG PO TBDP: 4.0000 mg | ORAL_TABLET | Freq: Three times a day (TID) | ORAL | 0 refills | Status: AC | PRN

## 2022-10-15 MED ORDER — ONDANSETRON HCL 4 MG/2ML IJ SOLN
4.0000 mg | Freq: Once | INTRAMUSCULAR | Status: AC
  Administered 2022-10-15: 4 mg via INTRAVENOUS
  Filled 2022-10-15: qty 2

## 2022-10-15 MED ORDER — MORPHINE SULFATE (PF) 4 MG/ML IV SOLN
4.0000 mg | Freq: Once | INTRAVENOUS | Status: AC
  Administered 2022-10-15: 4 mg via INTRAVENOUS
  Filled 2022-10-15: qty 1

## 2022-10-15 NOTE — ED Triage Notes (Signed)
[place] [facility] [place] Patient was sitting and started having pain left arm that goes to the center of her chest and her upper back. Patient had 8/10 pain and she describe it as sharp. EMS reported patient was given one nitroglycerin 0.4  sublingual [facility]. After that her pain was 6/10. EMS gave her 324 Asprin. Vital signs BP: 128/86, patient was in sinus rhythm at 72 and saturation 98% RA

## 2022-10-15 NOTE — ED Provider Notes (Signed)
[place] EMERGENCY DEPARTMENT AT [facility] Provider Note   CSN: [id number] Arrival date & time: 10/15/22  1059     History  Chief Complaint  Patient presents with   Chest Pain    [name] is a 62 y.o. female with extensive past medical history as outlined below [place] [facility] [place] [facility] complaining of left arm pain that radiated to the center of her chest and her upper back.  Patient states she was sitting when the pain began and she describes as sharp.  EMS gave patient 1 sublingual nitroglycerin and 324 aspirin, patient reports her pain slightly improved.  She reports feeling well up until this morning when the pain began.  Denies fever, shortness of breath, chest tightness, leg swelling, palpitations, nausea, vomiting, syncope.       Home Medications Prior to Admission medications   Medication Sig Start Date End Date Taking? Authorizing Provider  ACCU-CHEK GUIDE test strip  10/28/21   [provider]  acetaminophen (TYLENOL) 500 MG tablet Take 1,000 mg by mouth 2 (two) times daily as needed for moderate pain.    [provider]  albuterol (PROVENTIL HFA;VENTOLIN HFA) 108 (90 Base) MCG/ACT inhaler Inhale 2 puffs into the lungs every 6 (six) hours as needed for wheezing or shortness of breath.    [provider]  aspirin 81 MG chewable tablet Chew 81 mg by mouth daily.    [provider]  atorvastatin (LIPITOR) 40 MG tablet Take 40 mg by mouth at bedtime.     [provider]  busPIRone (BUSPAR) 30 MG tablet Take 30 mg by mouth 2 (two) times daily. 12/18/19   [provider]  carvedilol (COREG) 6.25 MG tablet Take 1 tablet (6.25 mg total) by mouth 2 (two) times daily. 09/06/21   Wendall Stade, MD  diclofenac Sodium (VOLTAREN) 1 % GEL Apply 2 g topically 4 (four) times daily as needed (joint/muscle pain). 09/20/19   [provider]  empagliflozin (JARDIANCE) 10 MG TABS tablet Take 1 tablet  (10 mg total) by mouth daily. 03/28/22   Arrien, York Ram, MD  EPINEPHrine 0.3 mg/0.3 mL IJ SOAJ injection Inject 0.3 mg into the muscle as needed for anaphylaxis. Follow package instructions as needed for severe allergy or anaphylactic reaction. 11/06/21   Sharman Cheek, MD  furosemide (LASIX) 40 MG tablet Take 1 tablet (40 mg total) by mouth daily as needed for edema or fluid (take in case of weight gain 2 to 3 lbs in 24 hrs or 5 lbs in 7 days.). 03/28/22 07/15/22  Arrien, York Ram, MD  hydrOXYzine (VISTARIL) 25 MG capsule Take 25 mg by mouth 3 (three) times daily as needed for itching.    [provider]  lidocaine (LIDODERM) 5 % Place 1 patch onto the skin daily as needed for pain. 02/11/21   [provider]  lidocaine-prilocaine (EMLA) cream Apply to affected area once Patient taking differently: Apply 1 Application topically once. Apply to affected area once 12/20/21   Rachel Moulds, MD  loperamide (IMODIUM) 2 MG capsule Take 1 capsule (2 mg total) by mouth 4 (four) times daily as needed for diarrhea or loose stools. 01/07/22   Long, Arlyss Repress, MD  losartan (COZAAR) 25 MG tablet Take 1 tablet (25 mg total) by mouth daily. 03/28/22 07/15/22  Arrien, York Ram, MD  metFORMIN (GLUCOPHAGE) 500 MG tablet Take 1 tablet (500 mg total) by mouth daily with breakfast. 09/07/22   Reuben Likes  U, MD  nitroGLYCERIN (NITROSTAT) 0.4 MG SL tablet Place 1 tablet (0.4 mg total) under the tongue every 5 (five) minutes as needed for chest pain. 04/11/22   Sharlene Dory, PA-C  nystatin (MYCOSTATIN/NYSTOP) powder Apply 1 application topically 3 (three) times daily as needed for rash.    [provider]  ondansetron (ZOFRAN-ODT) 4 MG disintegrating tablet Take 1 tablet (4 mg total) by mouth every 8 (eight) hours as needed for nausea or vomiting. 10/15/22   Chestine Spore, Avik Leoni R, PA-C  pantoprazole (PROTONIX) 40 MG tablet Take 1 tablet (40 mg total) by mouth 2 (two) times  daily. Patient taking differently: Take 40 mg by mouth daily. 03/09/21   Zannie Cove, MD  prochlorperazine (COMPAZINE) 10 MG tablet Take 1 tablet (10 mg total) by mouth every 6 (six) hours as needed for nausea or vomiting. 12/20/21   Rachel Moulds, MD  sertraline (ZOLOFT) 25 MG tablet Take 1 tablet (25 mg total) by mouth daily. 09/04/22   Langston Reusing, MD  spironolactone (ALDACTONE) 25 MG tablet Take 1/2 tablet (12.5 mg total) by mouth daily. Patient taking differently: Take 25 mg by mouth every other day. 03/28/22 07/15/22  Arrien, York Ram, MD  Vitamin D, Ergocalciferol, (DRISDOL) 1.25 MG (50000 UNIT) CAPS capsule Take 1 capsule (50,000 Units total) by mouth every 7 (seven) days. 09/28/22   William Hamburger D, NP      Allergies    Hydrocodone-acetaminophen, Penicillins, Shellfish allergy, Latex, and Sulfa antibiotics    Review of Systems   Review of Systems  Constitutional:  Negative for fever.  Respiratory:  Negative for chest tightness and shortness of breath.   Cardiovascular:  Positive for chest pain. Negative for palpitations and leg swelling.  Gastrointestinal:  Negative for nausea and vomiting.  Neurological:  Negative for syncope.    Physical Exam Updated Vital Signs BP 123/69 (BP Location: Left Wrist)   Pulse 63   Temp 97.8 F (36.6 C) (Oral)   Resp 17   Ht 4\' 10"  (1.473 m)   Wt 93.4 kg   SpO2 97%   BMI 43.05 kg/m  Physical Exam Vitals and nursing note reviewed.  Constitutional:      General: She is not in acute distress.    Appearance: She is not ill-appearing.  HENT:     Mouth/Throat:     Mouth: Mucous membranes are moist.     Pharynx: Oropharynx is clear.  Cardiovascular:     Rate and Rhythm: Normal rate and regular rhythm.     Pulses: Normal pulses.          Dorsalis pedis pulses are 2+ on the right side and 2+ on the left side.     Heart sounds: Normal heart sounds.  Pulmonary:     Effort: Pulmonary effort is normal. No respiratory distress.      Breath sounds: Normal breath sounds and air entry.  Abdominal:     General: Abdomen is flat. Bowel sounds are normal. There is no distension.     Palpations: Abdomen is soft.     Tenderness: There is no abdominal tenderness.  Musculoskeletal:     Right lower leg: No edema.     Left lower leg: No edema.  Skin:    General: Skin is warm and dry.     Capillary Refill: Capillary refill takes less than 2 seconds.  Neurological:     Mental Status: She is alert. Mental status is at baseline.  Psychiatric:  Mood and Affect: Mood normal.        Behavior: Behavior normal.     ED Results / Procedures / Treatments   Labs (all labs ordered are listed, but only abnormal results are displayed) Labs Reviewed  COMPREHENSIVE METABOLIC PANEL - Abnormal; Notable for the following components:      Result Value   Glucose, Bld 137 (*)    Total Protein 6.1 (*)    Albumin 3.4 (*)    All other components within normal limits  CBG MONITORING, ED - Abnormal; Notable for the following components:   Glucose-Capillary 128 (*)    All other components within normal limits  CBC WITH DIFFERENTIAL/PLATELET  TROPONIN I (HIGH SENSITIVITY)  TROPONIN I (HIGH SENSITIVITY)    EKG EKG Interpretation  Date/Time:  Sunday October 15 2022 11:13:22 EDT Ventricular Rate:  70 PR Interval:  196 QRS Duration: 115 QT Interval:  390 QTC Calculation: 421 R Axis:   65 Text Interpretation: Sinus rhythm Ventricular premature complex Nonspecific intraventricular conduction delay Nonspecific T abnormalities, lateral leads Confirmed by Kristine Royal [phone]) on 10/15/2022 11:25:09 AM  Radiology DG Chest Port 1 View  Result Date: 10/15/2022 CLINICAL DATA:  Chest pain EXAM: PORTABLE CHEST 1 VIEW COMPARISON:  07/23/2022 FINDINGS: The lungs are clear without focal pneumonia, edema, pneumothorax or pleural effusion. Cardiopericardial silhouette is at upper limits of normal for size. No acute bony abnormality. Telemetry  leads overlie the chest. IMPRESSION: No active disease. Electronically Signed   By: Kennith Center M.D.   On: 10/15/2022 11:54    Procedures Procedures    Medications Ordered in ED Medications  morphine (PF) 4 MG/ML injection 4 mg (4 mg Intravenous Given 10/15/22 1333)  ondansetron (ZOFRAN) injection 4 mg (4 mg Intravenous Given 10/15/22 1333)    ED Course/ Medical Decision Making/ A&P             HEART Score: 5                Medical Decision Making Amount and/or Complexity of Data Reviewed Labs: ordered. Radiology: ordered.   This patient presents to the ED with chief complaint(s) of chest pain, left arm and upper back pain with pertinent past medical history of hypertension, asthma, PE (not on anticoagulation), cancer, CHF, GERD, neuromuscular disorder, hyperlipidemia.  The complaint involves an extensive differential diagnosis and also carries with it a high risk of complications and morbidity.    The differential diagnosis includes ACS, PE, musculoskeletal pain, nerve impingement, neuropathy, pneumothorax, pericarditis, mediastinitis, esophageal spasm, GERD, pleuritis, stable angina, cardiomyopathy, valvular heart disease   The initial plan is to obtain chest pain work up  Additional history obtained: Records reviewed previous admission documents -patient was admitted for similar presenting symptoms in March 2024.  Patient was admitted for observation due to having a heart score of 5.  Initial Assessment:   On exam, patient is resting comfortably in bed and does not appear to be in acute distress.  Lungs are clear to auscultation bilaterally.  Heart rate is normal in the 60s with regular rhythm.  No appreciable murmurs.  No pedal edema.  Skin is warm and dry.  Abdomen is soft and nontender to palpation.  Patient has normal ROM of left arm and shoulder without exacerbation of pain.  She is also able to change positions in bed without difficulty or increase in pain.  Independent  ECG/labs interpretation:  The following labs were independently interpreted:  CBC without leukocytosis or anemia.  Metabolic panel without significant  electrolyte disturbance.  Renal function and hepatic function are both unremarkable.  Troponin 7, delta 0.  Independent visualization and interpretation of imaging: I independently visualized the following imaging with scope of interpretation limited to determining acute life threatening conditions related to emergency care: Chest x-ray, which revealed no evidence of pneumonia, pneumothorax, pleural effusion.  Cardiac silhouette is nonenlarged.  Treatment and Reassessment: Patient given morphine and Zofran IV to help with nausea and pain.  Upon reassessment, patient reports that her symptoms have improved.  Disposition:   Will send patient home on and Zofran to help with any nausea related to her pain.  Discussed supportive care measures for her atypical chest pain.  Her workup is overall reassuring and ACS is unlikely at this time.  Will send referral to cardiology, specifically Dr. Eden Emms, to schedule a follow-up appointment.  Patient verbalized her understanding and will follow-up with cardiology.  Patient understands to return to the ED if she develops sudden worsening of her symptoms or if they should change.    The patient has been appropriately medically screened and/or stabilized in the ED. I have low suspicion for any other emergent medical condition which would require further screening, evaluation or treatment in the ED or require inpatient management. At time of discharge the patient is hemodynamically stable and in no acute distress. I have discussed work-up results and diagnosis with patient and answered all questions. Patient is agreeable with discharge plan. We discussed strict return precautions for returning to the emergency department and they verbalized understanding.            Final Clinical Impression(s) / ED  Diagnoses Final diagnoses:  Atypical chest pain  Nausea without vomiting  Left arm pain    Rx / DC Orders ED Discharge Orders          Ordered    Ambulatory referral to Cardiology       Comments: If you have not heard from the Cardiology office within the next 72 hours please call [phone].   10/15/22 1505    ondansetron (ZOFRAN-ODT) 4 MG disintegrating tablet  Every 8 hours PRN        10/15/22 1505              Lenard Simmer, PA-C 10/15/22 1531    Wynetta Fines, MD 10/15/22 [phone]

## 2022-10-15 NOTE — Discharge Instructions (Addendum)
Thank you for allowing me to part of your care today.  You were evaluated in the ED for left arm pain, chest pain.  Your workup today is overall reassuring.  Your troponins, an enzyme measured to look for heart damage, were normal.  The rest your labs were also very reassuring along with your chest x-ray.  I have sent a referral over to Dr. Fabio Bering office for them to call you to schedule a follow-up appointment.  I have also sent over more Zofran to your pharmacy to use for nausea.  Continue take your medications as prescribed and use your nitroglycerin for chest pain.  You may also take Tylenol for musculoskeletal pain.  Return to the ED if you develop sudden worsening of your symptoms or if you have any new concerns.

## 2022-11-01 ENCOUNTER — Encounter (HOSPITAL_COMMUNITY): Payer: Self-pay

## 2022-11-01 ENCOUNTER — Emergency Department (HOSPITAL_COMMUNITY): Payer: 59

## 2022-11-01 ENCOUNTER — Emergency Department (HOSPITAL_COMMUNITY)
Admission: EM | Admit: 2022-11-01 | Discharge: 2022-11-01 | Disposition: A | Discharge status: Home / Self Care | Payer: 59 | Attending: Emergency Medicine | Admitting: Emergency Medicine

## 2022-11-01 ENCOUNTER — Other Ambulatory Visit: Payer: Self-pay

## 2022-11-01 DIAGNOSIS — Z9010 Acquired absence of unspecified breast and nipple: Secondary | ICD-10-CM | POA: Diagnosis not present

## 2022-11-01 DIAGNOSIS — Z7984 Long term (current) use of oral hypoglycemic drugs: Secondary | ICD-10-CM | POA: Insufficient documentation

## 2022-11-01 DIAGNOSIS — Z7982 Long term (current) use of aspirin: Secondary | ICD-10-CM | POA: Insufficient documentation

## 2022-11-01 DIAGNOSIS — Z853 Personal history of malignant neoplasm of breast: Secondary | ICD-10-CM | POA: Insufficient documentation

## 2022-11-01 DIAGNOSIS — J45909 Unspecified asthma, uncomplicated: Secondary | ICD-10-CM | POA: Insufficient documentation

## 2022-11-01 DIAGNOSIS — R0789 Other chest pain: Secondary | ICD-10-CM | POA: Insufficient documentation

## 2022-11-01 DIAGNOSIS — I5030 Unspecified diastolic (congestive) heart failure: Secondary | ICD-10-CM | POA: Diagnosis not present

## 2022-11-01 DIAGNOSIS — Z79899 Other long term (current) drug therapy: Secondary | ICD-10-CM | POA: Diagnosis not present

## 2022-11-01 DIAGNOSIS — Z91040 Latex allergy status: Secondary | ICD-10-CM | POA: Insufficient documentation

## 2022-11-01 DIAGNOSIS — I110 Hypertensive heart disease with heart failure: Secondary | ICD-10-CM | POA: Diagnosis not present

## 2022-11-01 DIAGNOSIS — E119 Type 2 diabetes mellitus without complications: Secondary | ICD-10-CM | POA: Diagnosis not present

## 2022-11-01 DIAGNOSIS — Z85830 Personal history of malignant neoplasm of bone: Secondary | ICD-10-CM | POA: Diagnosis not present

## 2022-11-01 LAB — BASIC METABOLIC PANEL
Anion gap: 8 (ref 5–15)
BUN: 19 mg/dL (ref 8–23)
CO2: 26 mmol/L (ref 22–32)
Calcium: 9.7 mg/dL (ref 8.9–10.3)
Chloride: 109 mmol/L (ref 98–111)
Creatinine, Ser: 1 mg/dL (ref 0.44–1.00)
GFR, Estimated: >60 mL/min (ref >60)
Glucose, Bld: 118 mg/dL — ABNORMAL HIGH (ref 70–99)
Potassium: 4 mmol/L (ref 3.5–5.1)
Sodium: 143 mmol/L (ref 135–145)

## 2022-11-01 LAB — CBC
HCT: 45.1 % (ref 36.0–46.0)
Hemoglobin: 14.4 g/dL (ref 12.0–15.0)
MCH: 29.2 pg (ref 26.0–34.0)
MCHC: 31.9 g/dL (ref 30.0–36.0)
MCV: 91.5 fL (ref 80.0–100.0)
Platelets: 214 10*3/uL (ref 150–400)
RBC: 4.93 MIL/uL (ref 3.87–5.11)
RDW: 14.1 % (ref 11.5–15.5)
WBC: 6.9 10*3/uL (ref 4.0–10.5)
nRBC: 0 % (ref 0.0–0.2)

## 2022-11-01 LAB — TROPONIN I (HIGH SENSITIVITY)
Troponin I (High Sensitivity): 8 ng/L (ref <18)
Troponin I (High Sensitivity): 8 ng/L (ref <18)

## 2022-11-01 MED ORDER — IOHEXOL 350 MG/ML SOLN
65.0000 mL | Freq: Once | INTRAVENOUS | Status: AC | PRN
  Administered 2022-11-01: 65 mL via INTRAVENOUS

## 2022-11-01 MED ORDER — MORPHINE SULFATE (PF) 4 MG/ML IV SOLN
4.0000 mg | Freq: Once | INTRAVENOUS | Status: AC
  Administered 2022-11-01: 4 mg via INTRAVENOUS
  Filled 2022-11-01: qty 1

## 2022-11-01 MED ORDER — ONDANSETRON HCL 4 MG/2ML IJ SOLN
4.0000 mg | Freq: Once | INTRAMUSCULAR | Status: AC
  Administered 2022-11-01: 4 mg via INTRAVENOUS
  Filled 2022-11-01: qty 2

## 2022-11-01 NOTE — ED Provider Notes (Signed)
[place] AT [facility] Provider Note   CSN: [id number] Arrival date & time: 11/01/22  1327     History  Chief Complaint  Patient presents with   Chest Pain    [name] is a 61 y.o. female with h/o breast cancers s/p lumpectomy, HTN, HLD, obesity, IBS, h/o PE, diastolic CHF, OSA on CPAP, T2DM,  presents with 10 out of 10 intermittent left sided chest pain that radiates to both shoulders and her back, HA, N/V, mild shortness of breath, and sweating started at 0800 today.  Per chart review, she presented on 10/15/22 to ED for L arm pain radiating to center of chest and upper back. Slightly improved w/ nitroglycerin and ASA. Had negative troponins, DC'd w/ cards f/u.  She states that the pain has not returned since today.  No aggravating factors but does feel better when she is laying down rather than sitting up.  She has not taken anything for the pain today.  She states the pain today feels more dull than the pain on 6/23.  She says it was felt like she twisted something wrong but she has not done anything unusual.  Denies any leg swelling.  She does have a history of a PE but she says it was many years ago and she is not currently on a blood thinner.  She does not know if this feels similar to that.   Chest Pain      Home Medications Prior to Admission medications   Medication Sig Start Date End Date Taking? Authorizing Provider  ACCU-CHEK GUIDE test strip  10/28/21   [provider]  acetaminophen (TYLENOL) 500 MG tablet Take 1,000 mg by mouth 2 (two) times daily as needed for moderate pain.    [provider]  albuterol (PROVENTIL HFA;VENTOLIN HFA) 108 (90 Base) MCG/ACT inhaler Inhale 2 puffs into the lungs every 6 (six) hours as needed for wheezing or shortness of breath.    [provider]  aspirin 81 MG chewable tablet Chew 81 mg by mouth daily.    [provider]  atorvastatin (LIPITOR) 40 MG tablet Take  40 mg by mouth at bedtime.     [provider]  busPIRone (BUSPAR) 30 MG tablet Take 30 mg by mouth 2 (two) times daily. 12/18/19   [provider]  carvedilol (COREG) 6.25 MG tablet Take 1 tablet (6.25 mg total) by mouth 2 (two) times daily. 09/06/21   Wendall Stade, MD  diclofenac Sodium (VOLTAREN) 1 % GEL Apply 2 g topically 4 (four) times daily as needed (joint/muscle pain). 09/20/19   [provider]  empagliflozin (JARDIANCE) 10 MG TABS tablet Take 1 tablet (10 mg total) by mouth daily. 03/28/22   Arrien, York Ram, MD  EPINEPHrine 0.3 mg/0.3 mL IJ SOAJ injection Inject 0.3 mg into the muscle as needed for anaphylaxis. Follow package instructions as needed for severe allergy or anaphylactic reaction. 11/06/21   Sharman Cheek, MD  furosemide (LASIX) 40 MG tablet Take 1 tablet (40 mg total) by mouth daily as needed for edema or fluid (take in case of weight gain 2 to 3 lbs in 24 hrs or 5 lbs in 7 days.). 03/28/22 07/15/22  Arrien, York Ram, MD  hydrOXYzine (VISTARIL) 25 MG capsule Take 25 mg by mouth 3 (three) times daily as needed for itching.    [provider]  lidocaine (LIDODERM) 5 % Place 1 patch onto the skin daily as needed for pain. 02/11/21  [provider]  lidocaine-prilocaine (EMLA) cream Apply to affected area once Patient taking differently: Apply 1 Application topically once. Apply to affected area once 12/20/21   Rachel Moulds, MD  loperamide (IMODIUM) 2 MG capsule Take 1 capsule (2 mg total) by mouth 4 (four) times daily as needed for diarrhea or loose stools. 01/07/22   Long, Arlyss Repress, MD  losartan (COZAAR) 25 MG tablet Take 1 tablet (25 mg total) by mouth daily. 03/28/22 07/15/22  Arrien, York Ram, MD  metFORMIN (GLUCOPHAGE) 500 MG tablet Take 1 tablet (500 mg total) by mouth daily with breakfast. 09/07/22   Langston Reusing, MD  nitroGLYCERIN (NITROSTAT) 0.4 MG SL tablet Place 1 tablet (0.4 mg total) under the  tongue every 5 (five) minutes as needed for chest pain. 04/11/22   Sharlene Dory, PA-C  nystatin (MYCOSTATIN/NYSTOP) powder Apply 1 application topically 3 (three) times daily as needed for rash.    [provider]  ondansetron (ZOFRAN-ODT) 4 MG disintegrating tablet Take 1 tablet (4 mg total) by mouth every 8 (eight) hours as needed for nausea or vomiting. 10/15/22   Chestine Spore, Meghan R, PA-C  pantoprazole (PROTONIX) 40 MG tablet Take 1 tablet (40 mg total) by mouth 2 (two) times daily. Patient taking differently: Take 40 mg by mouth daily. 03/09/21   Zannie Cove, MD  prochlorperazine (COMPAZINE) 10 MG tablet Take 1 tablet (10 mg total) by mouth every 6 (six) hours as needed for nausea or vomiting. 12/20/21   Rachel Moulds, MD  sertraline (ZOLOFT) 25 MG tablet Take 1 tablet (25 mg total) by mouth daily. 09/04/22   Langston Reusing, MD  spironolactone (ALDACTONE) 25 MG tablet Take 1/2 tablet (12.5 mg total) by mouth daily. Patient taking differently: Take 25 mg by mouth every other day. 03/28/22 07/15/22  Arrien, York Ram, MD  Vitamin D, Ergocalciferol, (DRISDOL) 1.25 MG (50000 UNIT) CAPS capsule Take 1 capsule (50,000 Units total) by mouth every 7 (seven) days. 09/28/22   William Hamburger D, NP      Allergies    Hydrocodone-acetaminophen, Penicillins, Shellfish allergy, Latex, and Sulfa antibiotics    Review of Systems   Review of Systems  Cardiovascular:  Positive for chest pain.   A 10 point review of systems was performed and is negative unless otherwise reported in HPI.  Physical Exam Updated Vital Signs BP (!) 141/78   Pulse 69   Temp 98.4 F (36.9 C) (Oral)   Resp 18   Ht 4\' 10"  (1.473 m)   Wt 93.4 kg   SpO2 94%   BMI 43.04 kg/m  Physical Exam General: Normal appearing female, lying in bed.  HEENT: PERRLA, Sclera anicteric, MMM, trachea midline.  Cardiology: RRR, no murmurs/rubs/gallops. BL radial and DP pulses equal bilaterally.  Resp: Normal respiratory rate  and effort. CTAB, no wheezes, rhonchi, crackles.  Abd: Soft, non-tender, non-distended. No rebound tenderness or guarding.  GU: Deferred. MSK: No peripheral edema or signs of trauma. Extremities without deformity or TTP. No cyanosis or clubbing. Skin: warm, dry. No rashes or lesions. Back: No CVA tenderness Neuro: A&Ox4, CNs II-XII grossly intact. MAEs. Sensation grossly intact.  Psych: Normal mood and affect.   ED Results / Procedures / Treatments   Labs (all labs ordered are listed, but only abnormal results are displayed) Labs Reviewed  BASIC METABOLIC PANEL - Abnormal; Notable for the following components:      Result Value   Glucose, Bld 118 (*)    All other components within normal limits  CBC  TROPONIN I (HIGH SENSITIVITY)  TROPONIN I (HIGH SENSITIVITY)    EKG None  Radiology CT Angio Chest PE W and/or Wo Contrast  Result Date: 11/01/2022 CLINICAL DATA:  Concern for pulmonary embolism. EXAM: CT ANGIOGRAPHY CHEST WITH CONTRAST TECHNIQUE: Multidetector CT imaging of the chest was performed using the standard protocol during bolus administration of intravenous contrast. Multiplanar CT image reconstructions and MIPs were obtained to evaluate the vascular anatomy. RADIATION DOSE REDUCTION: This exam was performed according to the departmental dose-optimization program which includes automated exposure control, adjustment of the mA and/or kV according to patient size and/or use of iterative reconstruction technique. CONTRAST:  65mL OMNIPAQUE IOHEXOL 350 MG/ML SOLN COMPARISON:  Chest CT dated 07/15/2022. FINDINGS: Evaluation of this exam is limited due to respiratory motion artifact. Cardiovascular: There is no cardiomegaly or pericardial effusion. Mild atherosclerotic calcification of the thoracic aorta. No aneurysmal dilatation or dissection. Evaluation of the pulmonary arteries is limited due to respiratory motion. No pulmonary artery embolus identified. Mediastinum/Nodes: No hilar or  mediastinal adenopathy. The esophagus is grossly unremarkable. Asymmetric enlargement of the right thyroid lobe with a 3.7 cm right lobe nodule. This has been evaluated on previous imaging. (ref: J Am Coll Radiol. 2015 Feb;12(2): 143-50).No mediastinal fluid collection. Lungs/Pleura: Linear atelectasis/scarring in the right middle lobe and lingula. No focal consolidation, pleural effusion, or pneumothorax. The previously seen subpleural nodule in the right upper lobe is not identified on today's exam. The central airways are patent. Upper Abdomen: No acute abnormality. Musculoskeletal: Osteopenia with degenerative changes of the spine. No acute osseous pathology. Improved appearance of skin thickening over the right breast since the prior CT. No fluid collection. Similar appearance of a biopsied nodule in the upper inner quadrant. Review of the MIP images confirms the above findings. IMPRESSION: 1. No acute intrathoracic pathology. No CT evidence of pulmonary embolism. 2.  Aortic Atherosclerosis (ICD10-I70.0). Electronically Signed   By: Elgie Collard M.D.   On: 11/01/2022 17:34   DG Chest 2 View  Result Date: 11/01/2022 CLINICAL DATA:  Left-sided chest pain radiating to both shoulders. EXAM: CHEST - 2 VIEW COMPARISON:  Chest radiographs 10/15/2022 and 07/23/2022 FINDINGS: Cardiac silhouette is again mildly enlarged. Mediastinal contours are within normal limits. Mild atherosclerotic calcifications within the aortic arch. Mild lateral left lower lung curvilinear subsegmental atelectasis. No pulmonary edema, pleural effusion, or pneumothorax. Mild-to-moderate multilevel degenerative disc changes of the thoracic spine. IMPRESSION: 1. Mild lateral left lower lung curvilinear subsegmental atelectasis. 2. Mild cardiomegaly. Electronically Signed   By: Neita Garnet M.D.   On: 11/01/2022 14:55    Procedures Procedures    Medications Ordered in ED Medications  morphine (PF) 4 MG/ML injection 4 mg (4 mg  Intravenous Given 11/01/22 1618)  ondansetron (ZOFRAN) injection 4 mg (4 mg Intravenous Given 11/01/22 1618)  iohexol (OMNIPAQUE) 350 MG/ML injection 65 mL (65 mLs Intravenous Contrast Given 11/01/22 1715)    ED Course/ Medical Decision Making/ A&P                          Medical Decision Making Amount and/or Complexity of Data Reviewed Labs: ordered. Decision-making details documented in ED Course. Radiology: ordered. Decision-making details documented in ED Course.  Risk Prescription drug management.    This patient presents to the ED for concern of chest pain, this involves an extensive number of treatment options, and is a complaint that carries with it a high risk of complications and morbidity.  I considered the  following differential and admission for this acute, potentially life threatening condition. Afebrile, HDS.  MDM:    DDX for chest pain includes but is not limited to:  ACS/arrhythmia,  PE, aortic dissection, pericarditis, GERD/PUD/gastritis, or musculoskeletal pain.  Her troponins were negative last time, lower concern for ACS also given that it is not exertional but it is associated with diaphoresis and vomiting so we will obtain troponins and EKG.  She does have a history of PE, cannot remember the context, but does not currently take blood thinners, given her recurrent chest pain and shortness of breath will get CT PE.  However, she is not tachycardic, hypoxic, or tachypneic.  Also consider dissection with recurrent chest pain radiating to the back with history of hypertension. No abdominal pain and no c/f biliary disease. Consider GERD, given known history.   Clinical Course as of 11/01/22 2029  Wed Nov 01, 2022  1556 Troponin I (High Sensitivity): 8 Neg first trop [HN]  1556 Unremarkable CBC, BMP [HN]  1556 DG Chest 2 View Mild cardiomegaly, L lower atelectasis [HN]  1749 Troponin I (High Sensitivity): 8 Flat, stable [HN]  1749 CT Angio Chest PE W and/or Wo  Contrast Negative CT scan [HN]    Clinical Course User Index [HN] Loetta Rough, MD    Labs: I Ordered, and personally interpreted labs.  The pertinent results include:  those listed above  Imaging Studies ordered: CXR ordered in triage. I ordered CT PE. I independently visualized and interpreted imaging. I agree with the radiologist interpretation  Additional history obtained from chart review  Cardiac Monitoring: The patient was maintained on a cardiac monitor.  I personally viewed and interpreted the cardiac monitored which showed an underlying rhythm of: NSR  Reevaluation: After the interventions noted above, I reevaluated the patient and found that they have :improved  Social Determinants of Health: Patient lives independently   Disposition: Patient with very reassuring chest pain workup. Neg serial trops.  Her pain is improving.  Heart score is low.  CT PE is negative for PE or dissection.  I believe that patient follow-up outpatient with cardiology as originally planned.  She is instructed to call them to make a follow-up appointment within the next 1 to 2 weeks.  She can also follow-up with her primary care physician in addition to the cardiologist.  She is given discharge instructions and return precautions.     Co morbidities that complicate the patient evaluation  Past Medical History:  Diagnosis Date   Allergy    Anginal pain (HCC)    a. NL cath in 2008;  b. Myoview 03/2011: dec uptake along mid anterior wall on stress imaging -> ? attenuation vs. ischemia, EF 65%;  c. 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 2008 -> coumadin x 6 mos.   Rheumatoid arthritis (HCC)    Sleep apnea    on CPAP 02/2018   SOB (shortness of breath)    Thyroid disease    TIA (transient ischemic attack)    Urinary urgency    Vitamin D deficiency      Medicines Meds ordered this encounter  Medications   morphine (PF) 4 MG/ML injection 4 mg   ondansetron (ZOFRAN) injection 4 mg   iohexol (OMNIPAQUE) 350 MG/ML injection 65 mL    I have reviewed the patients home medicines and have made adjustments as needed  Problem List / ED Course: Problem List Items Addressed This Visit   None Visit Diagnoses     Atypical chest pain    -  Primary                   This note was created using dictation software, which may contain spelling or grammatical errors.    Loetta Rough, MD 11/01/22 2030

## 2022-11-01 NOTE — ED Triage Notes (Signed)
Pt c/o left sided chest pain that radiates to both shoulders, HA, N/V, and sweating started at 0800 today.

## 2022-11-01 NOTE — Discharge Instructions (Signed)
Thank you for coming to [facility] Emergency Department. You were seen for chest pain. We did an exam, labs, and imaging, and these showed no acute findings.  Please follow up with your primary care provider within 1 week. Please also follow up with the cardiologist as originally instructed within 1-2 weeks.  You can alternate taking Tylenol and ibuprofen as needed for pain. You can take 650mg  tylenol (acetaminophen) every 4-6 hours, and 600 mg ibuprofen 3 times a day.   Do not hesitate to return to the ED or call 911 if you experience: -Worsening symptoms -Shortness of breath -Lightheadedness, passing out -Fevers/chills -Anything else that concerns you

## 2022-11-06 ENCOUNTER — Encounter (INDEPENDENT_AMBULATORY_CARE_PROVIDER_SITE_OTHER): Payer: Self-pay | Admitting: Adult Health

## 2022-11-06 ENCOUNTER — Ambulatory Visit (INDEPENDENT_AMBULATORY_CARE_PROVIDER_SITE_OTHER): Payer: 59 | Admitting: Adult Health

## 2022-11-06 VITALS — BP 118/77 | HR 70 | Temp 98.2°F | Ht 59.0 in | Wt 201.0 lb

## 2022-11-06 DIAGNOSIS — E1165 Type 2 diabetes mellitus with hyperglycemia: Secondary | ICD-10-CM

## 2022-11-06 DIAGNOSIS — E559 Vitamin D deficiency, unspecified: Secondary | ICD-10-CM | POA: Diagnosis not present

## 2022-11-06 DIAGNOSIS — E669 Obesity, unspecified: Secondary | ICD-10-CM

## 2022-11-06 DIAGNOSIS — Z7984 Long term (current) use of oral hypoglycemic drugs: Secondary | ICD-10-CM

## 2022-11-06 DIAGNOSIS — R0789 Other chest pain: Secondary | ICD-10-CM

## 2022-11-06 DIAGNOSIS — Z6841 Body Mass Index (BMI) 40.0 and over, adult: Secondary | ICD-10-CM

## 2022-11-06 MED ORDER — VITAMIN D (ERGOCALCIFEROL) 1.25 MG (50000 UNIT) PO CAPS
50000.0000 [IU] | ORAL_CAPSULE | ORAL | 0 refills | Status: DC
Start: 2022-11-06 — End: 2023-01-12

## 2022-11-06 MED ORDER — METFORMIN HCL 500 MG PO TABS: 500.0000 mg | ORAL_TABLET | Freq: Every day | ORAL | 0 refills | Status: DC

## 2022-11-06 NOTE — Progress Notes (Unsigned)
Office Visit    Patient Name: ROCHELL Barrera Date of Encounter: 11/07/2022  PCP:  Raymon Mutton., FNP   [facility]  Cardiologist:  Charlton Haws, MD  Advanced Practice Provider:  No care team member to display Electrophysiologist:  None   HPI    [name] is a 62 y.o. female with past medical history significant for breast cancer (newly diagnosed), bone cancer, HFpEF, OSA, remote history of PE no longer on OAC, TAA, HTN, atypical chest pain and HLD presents today for hospital follow-up appointment.  She was originally seen by Dr. [place] in 2008 and underwent normal LHC with normal LV function.  She was treated with Coumadin in 04/2006 for questionable PE.  Cardiac CTA was completed 01/2010 which showed no PE.  She was seen in the ED 2012 for complaints of chest pain.  She underwent nuclear stress test with Myoview that was found to be low risk, chest pain was likely ruled musculoskeletal etiology.  Patient has subsequent repeat Myoview for chest pain in 10/2012 which was normal.  She was hospitalized 05/2014 with chest pain and left-sided numbness and MRI was completed to rule out stroke which was negative.  She was seen in the ER for chest pain 09/03/2020.  Ruled out with negative tropes.  D-dimer was elevated.  CTA negative for PE.  She was seen in the ED for complaint of chest pain in 02/2021.  Patient reported throbbing dull pain in the center of her chest with associated shortness of breath.  CT was completed that was negative for PE but did reveal possible worsening pericardial effusion.  2D echocardiogram was ordered which showed EF 50 to 55%, no RWMA, mild LVH, grade 2 DD, and normal valve function.  Her troponins were negative x 2 and EKG had nonspecific ST changes.  Patient had relief with Nitropaste and was referred to GI for possible upper endoscopy.  She was seen in the ED 11/2020 with complaint of chest pain or shortness of breath thought to be  possible CHF exacerbation.  BNP was normal and troponins were negative.  The patient was last seen 12/12/2021 and had just been in the ED for possible CHF exacerbation.  She reported not feeling well and denied any current chest discomfort on follow-up.  She has been euvolemic on exam with been compliant with all of her current medications.  Blood pressure 108/70 and reported that they have been on the low side for a few days.  11/30-12/5 She was admitted and started on spiro, jardiance, and losartan. Echo reviewed with normal diastolic and systolic function. She has sciatica which keeps her from doing any significant walking. She tells me that after her last chemo treatment was when the swelling in her legs started. Seen in follow-up 12/19 by myself and  she does has some swelling and left arm pain. She tells me she has gained about 7 lbs since she was in the hospital. No chest pain.    She was seen by me 04/2022, she shares her SOB and lower ext swelling is much better. She has not had any chest pain or palpitations. Her cancer treatments are over and now she has noticed less swelling. She did call the cancer center Friday since she has some radiation burn under her right breast and needed some cream. She ended up using an OTC burn cream which has helped. She does not feel like her concerns are being addressed by the cancer center.  Otherwise, doing well from a CV standpoint.  Recently in the ED  for atypical chest pain.  Troponins were negative and concern for ACS was low.  Nonexertional.  It was associated with diaphoresis and vomiting.  History of PE not currently on any blood thinners.  Pain was improving.  CT was negative for PE.  Plan for follow-up with cardiology with no additional testing.  Today, she says her chest pain was like a sharp pain and then dull. Nothing that she did would make it go away. Left side and then to her back. Was exertional, got worse when she was walking, and then resolved  with rest. Sometimes positional. We discussed updating an  echo too r/o pericarditis. She explained it as she was putting on her clothes and it "caught her" and then had some nasuea and sweating, throwing up. She had not eaten anything that morning when she went to church. She has not used her nitroglycerin at all. She does endorse headaches at times. Occassional palpitations but do not last long.  No edema, orthopnea, PND.    Past Medical History    Past Medical History:  Diagnosis Date   Allergy    Anginal pain (HCC)    a. NL cath in 2008;  b. Myoview 03/2011: dec uptake along mid anterior wall on stress imaging -> ? attenuation vs. ischemia, EF 65%;  c. 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 2008 -> coumadin x 6 mos.   Rheumatoid arthritis (HCC)    Sleep apnea    on CPAP 02/2018   SOB (shortness of breath)    Thyroid disease    TIA (transient ischemic attack)    Urinary urgency    Vitamin D deficiency    Past Surgical History:  Procedure Laterality Date   ABDOMINAL HYSTERECTOMY  2005   APPENDECTOMY     BREAST LUMPECTOMY WITH RADIOACTIVE SEED AND SENTINEL LYMPH NODE BIOPSY Right 11/16/2021   Procedure: RIGHT BREAST RADIOACTIVE SEED LOCALIZED LUMPECTOMY WITH RIGHT AXILLARY SENTINEL LYMPH  NODE BIOPSY;  Surgeon: Manus Rudd, MD;  Location: [facility];  Service: General;  Laterality: Right;  LMA & PEC BLOCK   CARDIAC CATHETERIZATION     Normal   CARDIAC CATHETERIZATION N/A 09/30/2014   Procedure: Left Heart Cath and Coronary Angiography;  Surgeon: Tonny Bollman, MD;  Location: [facility] INVASIVE CV LAB;  Service: Cardiovascular;  Laterality: N/A;   LAPAROSCOPIC APPENDECTOMY N/A 06/03/2012   Procedure: APPENDECTOMY  LAPAROSCOPIC;  Surgeon: Almond Lint, MD;  Location: [facility] OR;  Service: General;  Laterality: N/A;   Left knee surgery  2008   LEG SURGERY     TONSILLECTOMY     TOTAL HIP ARTHROPLASTY Left 06/11/2018   Procedure: LEFT TOTAL HIP ARTHROPLASTY ANTERIOR APPROACH;  Surgeon: Cammy Copa, MD;  Location: [facility] OR;  Service: Orthopedics;  Laterality: Left;   TOTAL KNEE ARTHROPLASTY Left 08/22/2016   Procedure: TOTAL KNEE ARTHROPLASTY;  Surgeon: Cammy Copa, MD;  Location: [facility] OR;  Service: Orthopedics;  Laterality: Left;   TUBAL LIGATION  1989    Allergies  Allergies  Allergen Reactions   Hydrocodone-Acetaminophen Itching and Other (See Comments)    confusion   Penicillins Swelling and Other (See Comments)    Has patient had a PCN reaction causing immediate rash, facial/tongue/throat swelling, SOB or lightheadedness with hypotension: YES Has patient had a PCN reaction causing severe rash involving mucus membranes or skin necrosis: NO Has patient had a PCN reaction that required hospitalization NO Has patient had a PCN reaction occurring within the last 10 years: NO If all of the above answers are "NO", then may proceed with Cephalosporin use.   Shellfish Allergy Hives   Latex Rash   Sulfa Antibiotics Diarrhea, Itching and Rash   EKGs/Labs/Other Studies Reviewed:   The following studies were reviewed today:  03/24/2022 echocardiogram  IMPRESSIONS     1. Left ventricular ejection fraction, by estimation, is 60 to 65%. The  left ventricle has  normal function. The left ventricle has no regional  wall motion abnormalities. Left ventricular diastolic parameters were  normal.   2. Right ventricular systolic function is normal. The right ventricular  size is normal.   3. The mitral valve is normal in structure. Trivial mitral valve  regurgitation. No evidence of mitral stenosis.   4. The aortic valve is tricuspid. Aortic valve regurgitation is trivial.  No aortic stenosis is present.   5. The inferior vena cava is dilated in size with <50% respiratory  variability, suggesting right atrial pressure of 15 mmHg.   FINDINGS   Left Ventricle: Left ventricular ejection fraction, by estimation, is 60  to 65%. The left ventricle has normal function. The left ventricle has no  regional wall motion abnormalities. The left ventricular internal cavity  size was normal in size. There is   no left ventricular hypertrophy. Left ventricular diastolic parameters  were normal.   Right Ventricle: The right ventricular size is normal. Right ventricular  systolic function is normal.   Left Atrium: Left atrial size was normal in size.   Right Atrium: Right atrial size was normal in size.   Pericardium: There is no evidence of pericardial effusion.   Mitral Valve: The mitral valve is normal in structure. Trivial mitral  valve regurgitation. No evidence of mitral valve stenosis.   Tricuspid Valve: The tricuspid valve is normal in structure. Tricuspid  valve regurgitation is trivial. No evidence of tricuspid stenosis.   Aortic Valve: The aortic valve is tricuspid. Aortic valve regurgitation is  trivial. No aortic stenosis is present.   Pulmonic Valve: The pulmonic valve was normal in structure. Pulmonic valve  regurgitation is mild. No evidence of pulmonic stenosis.   Aorta: The aortic root is normal in size and structure.   Venous: The inferior vena cava is dilated in size with less than 50%  respiratory variability, suggesting right  atrial pressure of 15 mmHg.   IAS/Shunts: No atrial level shunt detected by color flow  Doppler.   EKG:  EKG is  ordered today.  The ekg ordered today demonstrates NSR with no ST elevation  Recent Labs: 03/23/2022: B Natriuretic Peptide 22.9 03/24/2022: TSH 0.467 04/11/2022: NT-Pro BNP 54 07/16/2022: Magnesium 1.9 10/15/2022: ALT 24 11/01/2022: BUN 19; Creatinine, Ser 1.00; Hemoglobin 14.4; Platelets 214; Potassium 4.0; Sodium 143  Recent Lipid Panel    Component Value Date/Time   CHOL 127 07/16/2022 0145   CHOL 127 09/28/2021 0951   TRIG 119 07/16/2022 0145   HDL 48 07/16/2022 0145   HDL 48 09/28/2021 0951   CHOLHDL 2.6 07/16/2022 0145   VLDL 24 07/16/2022 0145   LDLCALC 55 07/16/2022 0145   LDLCALC 60 09/28/2021 0951    Home Medications   Current Meds  Medication Sig   ACCU-CHEK GUIDE test strip    acetaminophen (TYLENOL) 500 MG tablet Take 1,000 mg by mouth 2 (two) times daily as needed for moderate pain.   albuterol (PROVENTIL HFA;VENTOLIN HFA) 108 (90 Base) MCG/ACT inhaler Inhale 2 puffs into the lungs every 6 (six) hours as needed for wheezing or shortness of breath.   aspirin 81 MG chewable tablet Chew 81 mg by mouth daily.   atorvastatin (LIPITOR) 40 MG tablet Take 40 mg by mouth at bedtime.    busPIRone (BUSPAR) 30 MG tablet Take 30 mg by mouth 2 (two) times daily.   carvedilol (COREG) 6.25 MG tablet Take 1 tablet (6.25 mg total) by mouth 2 (two) times daily.   diclofenac Sodium (VOLTAREN) 1 % GEL Apply 2 g topically 4 (four) times daily as needed (joint/muscle pain).   empagliflozin (JARDIANCE) 10 MG TABS tablet Take 1 tablet (10 mg total) by mouth daily.   EPINEPHrine 0.3 mg/0.3 mL IJ SOAJ injection Inject 0.3 mg into the muscle as needed for anaphylaxis. Follow package instructions as needed for severe allergy or anaphylactic reaction.   furosemide (LASIX) 40 MG tablet Take 1 tablet (40 mg total) by mouth daily as needed for edema or fluid (take in case of weight gain  2 to 3 lbs in 24 hrs or 5 lbs in 7 days.).   hydrOXYzine (VISTARIL) 25 MG capsule Take 25 mg by mouth 3 (three) times daily as needed for itching.   lidocaine (LIDODERM) 5 % Place 1 patch onto the skin daily as needed for pain.   lidocaine-prilocaine (EMLA) cream Apply to affected area once (Patient taking differently: Apply 1 Application topically once. Apply to affected area once)   loperamide (IMODIUM) 2 MG capsule Take 1 capsule (2 mg total) by mouth 4 (four) times daily as needed for diarrhea or loose stools.   losartan (COZAAR) 25 MG tablet Take 1 tablet (25 mg total) by mouth daily.   metFORMIN (GLUCOPHAGE) 500 MG tablet Take 1 tablet (500 mg total) by mouth daily with breakfast.   methocarbamol (ROBAXIN) 500 MG tablet as needed for muscle spasms.   nitroGLYCERIN (NITROSTAT) 0.4 MG SL tablet Place 1 tablet (0.4 mg total) under the tongue every 5 (five) minutes as needed for chest pain.   nystatin (MYCOSTATIN/NYSTOP) powder Apply 1 application topically 3 (three) times daily as needed for rash.   ondansetron (ZOFRAN) 8 MG tablet as needed for nausea or vomiting.   ondansetron (ZOFRAN-ODT) 4 MG disintegrating tablet Take 1 tablet (4 mg total) by mouth every 8 (eight) hours as needed for nausea or vomiting.   oxyCODONE (OXY IR/ROXICODONE) 5 MG immediate release tablet Take 1 tablet by mouth every 6 (six) hours as needed.   pantoprazole (PROTONIX)  40 MG tablet Take 1 tablet (40 mg total) by mouth 2 (two) times daily. (Patient taking differently: Take 40 mg by mouth daily.)   prochlorperazine (COMPAZINE) 10 MG tablet Take 1 tablet (10 mg total) by mouth every 6 (six) hours as needed for nausea or vomiting.   sertraline (ZOLOFT) 25 MG tablet Take 1 tablet (25 mg total) by mouth daily.   spironolactone (ALDACTONE) 25 MG tablet Take 1/2 tablet (12.5 mg total) by mouth daily. (Patient taking differently: Take 25 mg by mouth every other day.)   Vitamin D, Ergocalciferol, (DRISDOL) 1.25 MG (50000 UNIT)  CAPS capsule Take 1 capsule (50,000 Units total) by mouth every 7 (seven) days.     Review of Systems      All other systems reviewed and are otherwise negative except as noted above.  Physical Exam    VS:  BP 120/68   Pulse 73   Ht 4\' 10"  (1.473 m)   Wt 205 lb 12.8 oz (93.4 kg)   SpO2 96%   BMI 43.01 kg/m  , BMI Body mass index is 43.01 kg/m.  Wt Readings from Last 3 Encounters:  11/07/22 205 lb 12.8 oz (93.4 kg)  11/06/22 201 lb (91.2 kg)  11/01/22 205 lb 14.6 oz (93.4 kg)     GEN: Well nourished, well developed, in no acute distress. HEENT: normal. Neck: Supple, no JVD, carotid bruits, or masses. Cardiac: RRR, no murmurs, rubs, or gallops. No clubbing, cyanosis, 1+ pitting edema bilaterally.  Radials/PT 2+ and equal bilaterally.  Respiratory:  Respirations regular and unlabored, clear to auscultation bilaterally. GI: Soft, nontender, nondistended. MS: No deformity or atrophy. Skin: Warm and dry, no rash. Neuro:  Strength and sensation are intact. Psych: Normal affect.  Assessment & Plan    HFpEF -euvolemic on exam today -low sodium diet -lower ext compression -daily weights and if increases by 2 lbs overnight or 5 lbs in a week we need a phone call -she had her last treatment (radiation for breast cancer) earlier this months  Stable angina -nitro as needed (she has not needed) -recent ER workup neg for ischemic pain -will update echo  Shortness of breath -euvolemic on exam today -Much improved today -she has PRN lasix   HLD -LDL 60, at goal < 70 -continue Lipitor 40mg  daily -repeat lipid panel 09/2022  HTN -well controlled today -continue current medications      Disposition: Follow up 3 month with Charlton Haws, MD or APP.  Signed, Sharlene Dory, PA-C 11/07/2022, 12:38 PM [facility]

## 2022-11-06 NOTE — Progress Notes (Signed)
WEIGHT SUMMARY AND BIOMETRICS  Vitals Temp: 98.2 F (36.8 C) BP: 118/77 Pulse Rate: 70 SpO2: 98 %   Anthropometric Measurements Height: 4\' 11"  (1.499 m) Weight: 201 lb (91.2 kg) BMI (Calculated): 40.58 Weight at Last Visit: 206lb Weight Lost Since Last Visit: 5lb Weight Gained Since Last Visit: 0 Starting Weight: 209lb Total Weight Loss (lbs): 8 lb (3.629 kg)   Body Composition  Body Fat %: 49.3 % Fat Mass (lbs): 99.4 lbs Muscle Mass (lbs): 97.2 lbs Total Body Water (lbs): 76.6 lbs Visceral Fat Rating : 16   Other Clinical Data Fasting: No Labs: no Today's Visit #: 14 Starting Date: 06/01/21    Chief Complaint:   OBESITY [name] is here to discuss her progress with her obesity treatment plan. She is on the the Category 2 Plan and states she is following her eating plan approximately 80 % of the time. She states she is exercising Water Aerobics 45 minutes 2-3 times per week.   Interim History:  [name] reports often skips meals due to low appetite, re: lunch or dinner  [name] has been seen/evaluated twice at ED for Atypical CP (10/15/2022, 11/01/2022)  [name] reports at least 80% compliance with prescribed meal plan  Subjective:   1. Atypical chest pain She has had 2 recent visits to ED for Atypical CP  10/15/2022 Chest Xray IMPRESSION: No active disease.  11/01/2022 Chest XRay IMPRESSION: 1. Mild lateral left lower lung curvilinear subsegmental atelectasis. 2. Mild cardiomegaly.  ADVISED TO F/U WITH CARDS  07/16/2022 ECHO Left ventricular ejection fraction, by estimation, is 55 to 60%.   2. Type 2 diabetes mellitus with hyperglycemia, without long-term current use of insulin (HCC) Lab Results  Component Value Date   HGBA1C 7.3 (H) 07/15/2022   HGBA1C 6.4 (H) 09/28/2021   HGBA1C 8.4 (H) 03/08/2021    Per pt home fasting CBG 120s or less She is currently on daily Jardiance 10mg  and Metformin 500mg   3. Vitamin D  deficiency  Latest Reference Range & Units 08/14/22 12:44  Vitamin D, 25-Hydroxy 30.0 - 100.0 ng/mL 30.3   She is on weekly Ergocalciferol- denies N/V/Muscle Weakness   Assessment/Plan:   1. Atypical chest pain Follow-up with Cards Discussed Red Flag sx's and if any develop- seek immediate medical assistance. Pt. Verbalized understanding and agreement  2. Type 2 diabetes mellitus with hyperglycemia, without long-term current use of insulin (HCC) Refill - metFORMIN (GLUCOPHAGE) 500 MG tablet; Take 1 tablet (500 mg total) by mouth daily with breakfast.  Dispense: 30 tablet; Refill: 0  3. Vitamin D deficiency Refill - Vitamin D, Ergocalciferol, (DRISDOL) 1.25 MG (50000 UNIT) CAPS capsule; Take 1 capsule (50,000 Units total) by mouth every 7 (seven) days.  Dispense: 4 capsule; Refill: 0  4. Obesity, current BMI of 40.58  [name] is currently in the action stage of change. As such, her goal is to continue with weight loss efforts. She has agreed to the Category 2 Plan.   Exercise goals: All adults should avoid inactivity. Some physical activity is better than none, and adults who participate in any amount of physical activity gain some health benefits. Adults should also include muscle-strengthening activities that involve all major muscle groups on 2 or more days a week.  Behavioral modification strategies: increasing lean protein intake, decreasing simple carbohydrates, increasing vegetables, increasing water intake, meal planning and cooking strategies, keeping healthy foods in the home, and planning for success.  [name] has agreed to follow-up with our clinic in  4 weeks. She was informed of the importance of frequent follow-up visits to maximize her success with intensive lifestyle modifications for her multiple health conditions.   Objective:   Blood pressure 118/77, pulse 70, temperature 98.2 F (36.8 C), height 4\' 11"  (1.499 m), weight 201 lb (91.2 kg), SpO2 98%. Body mass index  is 40.6 kg/m.  General: Cooperative, alert, well developed, in no acute distress. HEENT: Conjunctivae and lids unremarkable. Cardiovascular: Regular rhythm.  Lungs: Normal work of breathing. Neurologic: No focal deficits.   Lab Results  Component Value Date   CREATININE 1.00 11/01/2022   BUN 19 11/01/2022   NA 143 11/01/2022   K 4.0 11/01/2022   CL 109 11/01/2022   CO2 26 11/01/2022   Lab Results  Component Value Date   ALT 24 10/15/2022   AST 19 10/15/2022   ALKPHOS 58 10/15/2022   BILITOT 0.6 10/15/2022   Lab Results  Component Value Date   HGBA1C 7.3 (H) 07/15/2022   HGBA1C 6.4 (H) 09/28/2021   HGBA1C 8.4 (H) 03/08/2021   HGBA1C 7.6 (H) 02/03/2020   HGBA1C 7.5 (H) 10/07/2019   Lab Results  Component Value Date   INSULIN 13.4 08/14/2022   INSULIN 22.1 09/28/2021   INSULIN 26.8 (H) 06/01/2021   INSULIN 44.4 (H) 02/03/2020   INSULIN 28.1 (H) 12/26/2018   Lab Results  Component Value Date   TSH 0.467 03/24/2022   Lab Results  Component Value Date   CHOL 127 07/16/2022   HDL 48 07/16/2022   LDLCALC 55 07/16/2022   TRIG 119 07/16/2022   CHOLHDL 2.6 07/16/2022   Lab Results  Component Value Date   VD25OH 30.3 08/14/2022   VD25OH 39.8 09/28/2021   VD25OH 30.0 06/01/2021   Lab Results  Component Value Date   WBC 6.9 11/01/2022   HGB 14.4 11/01/2022   HCT 45.1 11/01/2022   MCV 91.5 11/01/2022   PLT 214 11/01/2022   No results found for: "IRON", "TIBC", "FERRITIN"  Attestation Statements:   Reviewed by clinician on day of visit: allergies, medications, problem list, medical history, surgical history, family history, social history, and previous encounter notes.  I have reviewed the above documentation for accuracy and completeness, and I agree with the above. -  Bea Duren d. Cleland Simkins, NP-C

## 2022-11-07 ENCOUNTER — Encounter: Payer: Self-pay | Admitting: Physician Assistant

## 2022-11-07 ENCOUNTER — Ambulatory Visit: Payer: 59 | Attending: Physician Assistant | Admitting: Physician Assistant

## 2022-11-07 VITALS — BP 120/68 | HR 73 | Ht <= 58 in | Wt 205.8 lb

## 2022-11-07 DIAGNOSIS — R0602 Shortness of breath: Secondary | ICD-10-CM

## 2022-11-07 DIAGNOSIS — I2089 Other forms of angina pectoris: Secondary | ICD-10-CM

## 2022-11-07 DIAGNOSIS — I5033 Acute on chronic diastolic (congestive) heart failure: Secondary | ICD-10-CM

## 2022-11-07 DIAGNOSIS — I10 Essential (primary) hypertension: Secondary | ICD-10-CM

## 2022-11-07 DIAGNOSIS — E785 Hyperlipidemia, unspecified: Secondary | ICD-10-CM

## 2022-11-07 NOTE — Patient Instructions (Addendum)
Medication Instructions:   Your physician recommends that you continue on your current medications as directed. Please refer to the Current Medication list given to you today.   *If you need a refill on your cardiac medications before your next appointment, please call your pharmacy*   Lab Work:  TSH TODAY    If you have labs (blood work) drawn today and your tests are completely normal, you will receive your results only by: MyChart Message (if you have MyChart) OR A paper copy in the mail If you have any lab test that is abnormal or we need to change your treatment, we will call you to review the results.   Testing/Procedures: Your physician has requested that you have an echocardiogram. Echocardiography is a painless test that uses sound waves to create images of your heart. It provides your doctor with information about the size and shape of your heart and how well your heart's chambers and valves are working. This procedure takes approximately one hour. There are no restrictions for this procedure. Please do NOT wear cologne, perfume, aftershave, or lotions (deodorant is allowed). Please arrive 15 minutes prior to your appointment time.   Follow-Up: At [facility], you and your health needs are our priority.  As part of our continuing mission to provide you with exceptional heart care, we have created designated Provider Care Teams.  These Care Teams include your primary Cardiologist (physician) and Advanced Practice Providers (APPs -  Physician Assistants and Nurse Practitioners) who all work together to provide you with the care you need, when you need it.  We recommend signing up for the patient portal called "MyChart".  Sign up information is provided on this After Visit Summary.  MyChart is used to connect with patients for Virtual Visits (Telemedicine).  Patients are able to view lab/test results, encounter notes, upcoming appointments, etc.  Non-urgent messages can be  sent to your provider as well.   To learn more about what you can do with MyChart, go to [email or web address].    Your next appointment:   3 month(s)  Provider:   Charlton Haws, MD  / [facility]   Other Instructions

## 2022-11-08 LAB — TSH: TSH: 0.746 u[IU]/mL (ref 0.450–4.500)

## 2022-11-10 ENCOUNTER — Telehealth: Payer: Self-pay

## 2022-11-10 NOTE — Telephone Encounter (Signed)
Received [facility] pt requesting call back. Ready to restart PREP.   Call to pt. Confirmed can start 11/20/22 with MW [id number].  Will redo VS 1st day of class.

## 2022-11-18 IMAGING — CR DG LUMBAR SPINE 2-3V
3 series · 3 of 3 positions shown · non-contrast
Comparison: 11/08/2019

CLINICAL DATA: Left side back and hip pain that radiates down back
of leg x 1 week. NKI. Hx of left hip replacement

EXAM:
LUMBAR SPINE 3 VIEW

[t lumbar spine ap]
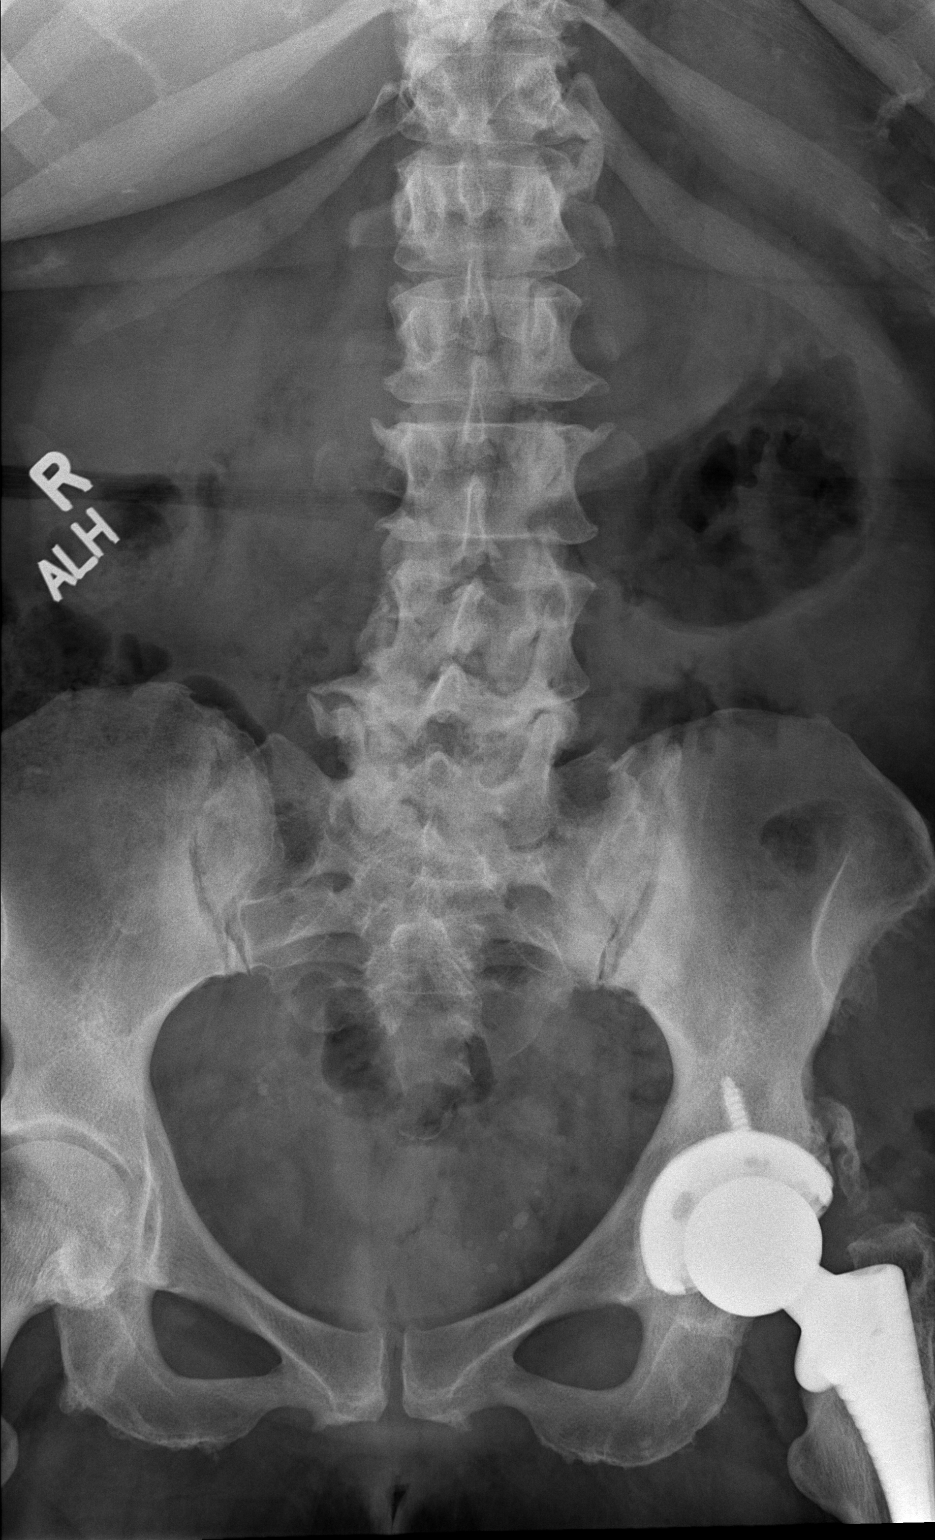

[t lumbar spine lat]
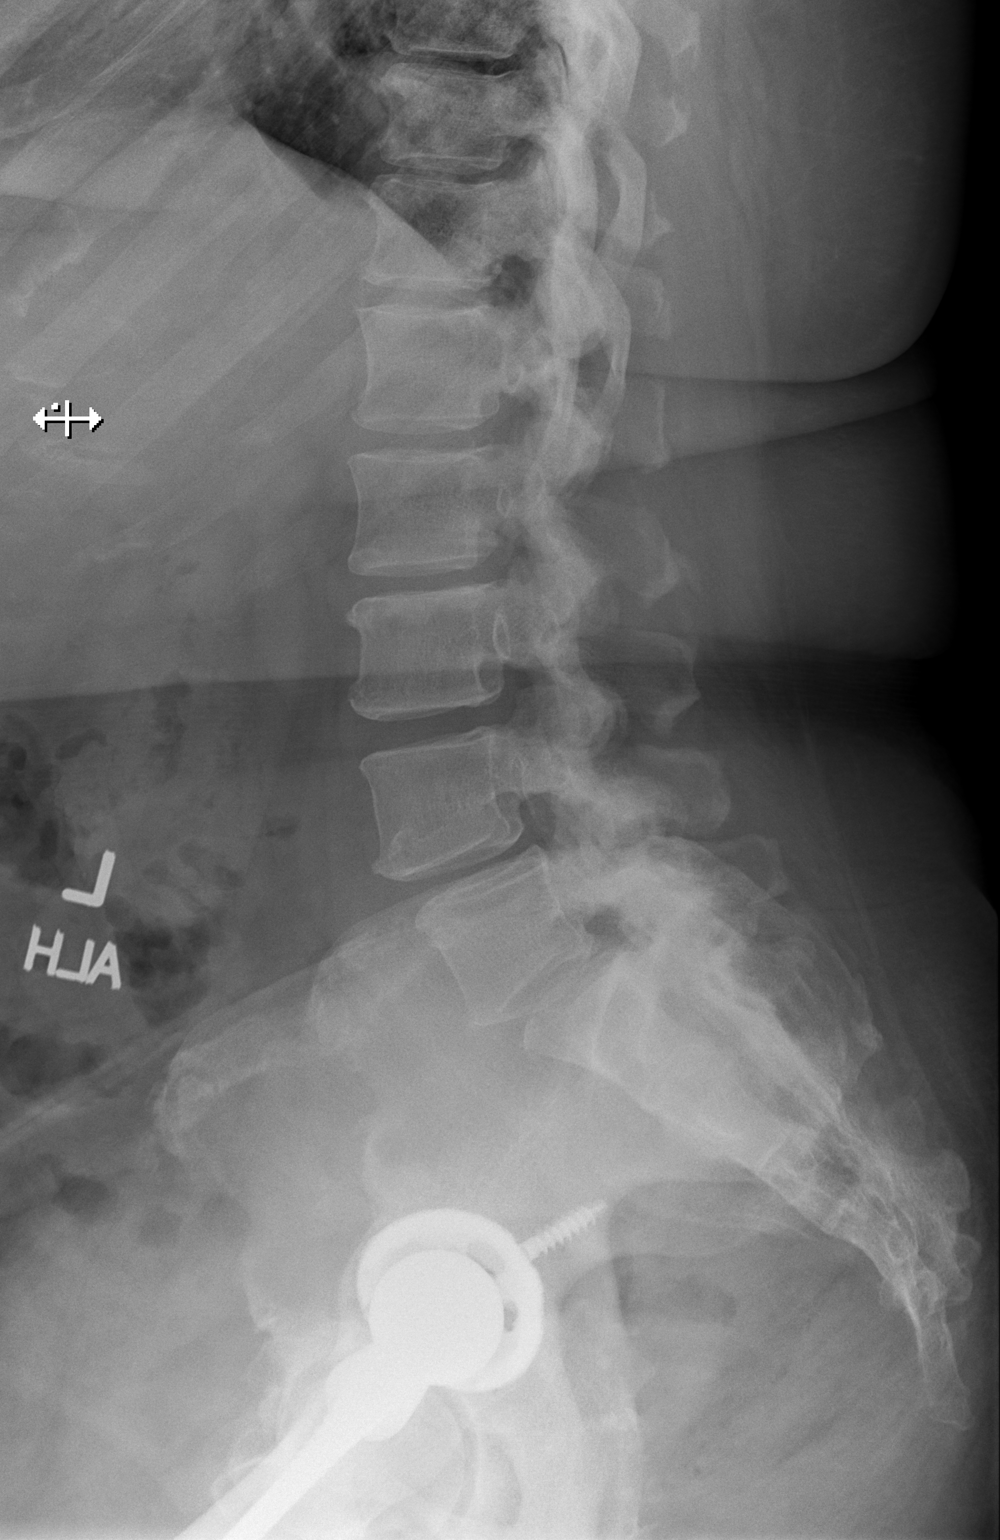

[t lumbar l-5 s-1 spot]
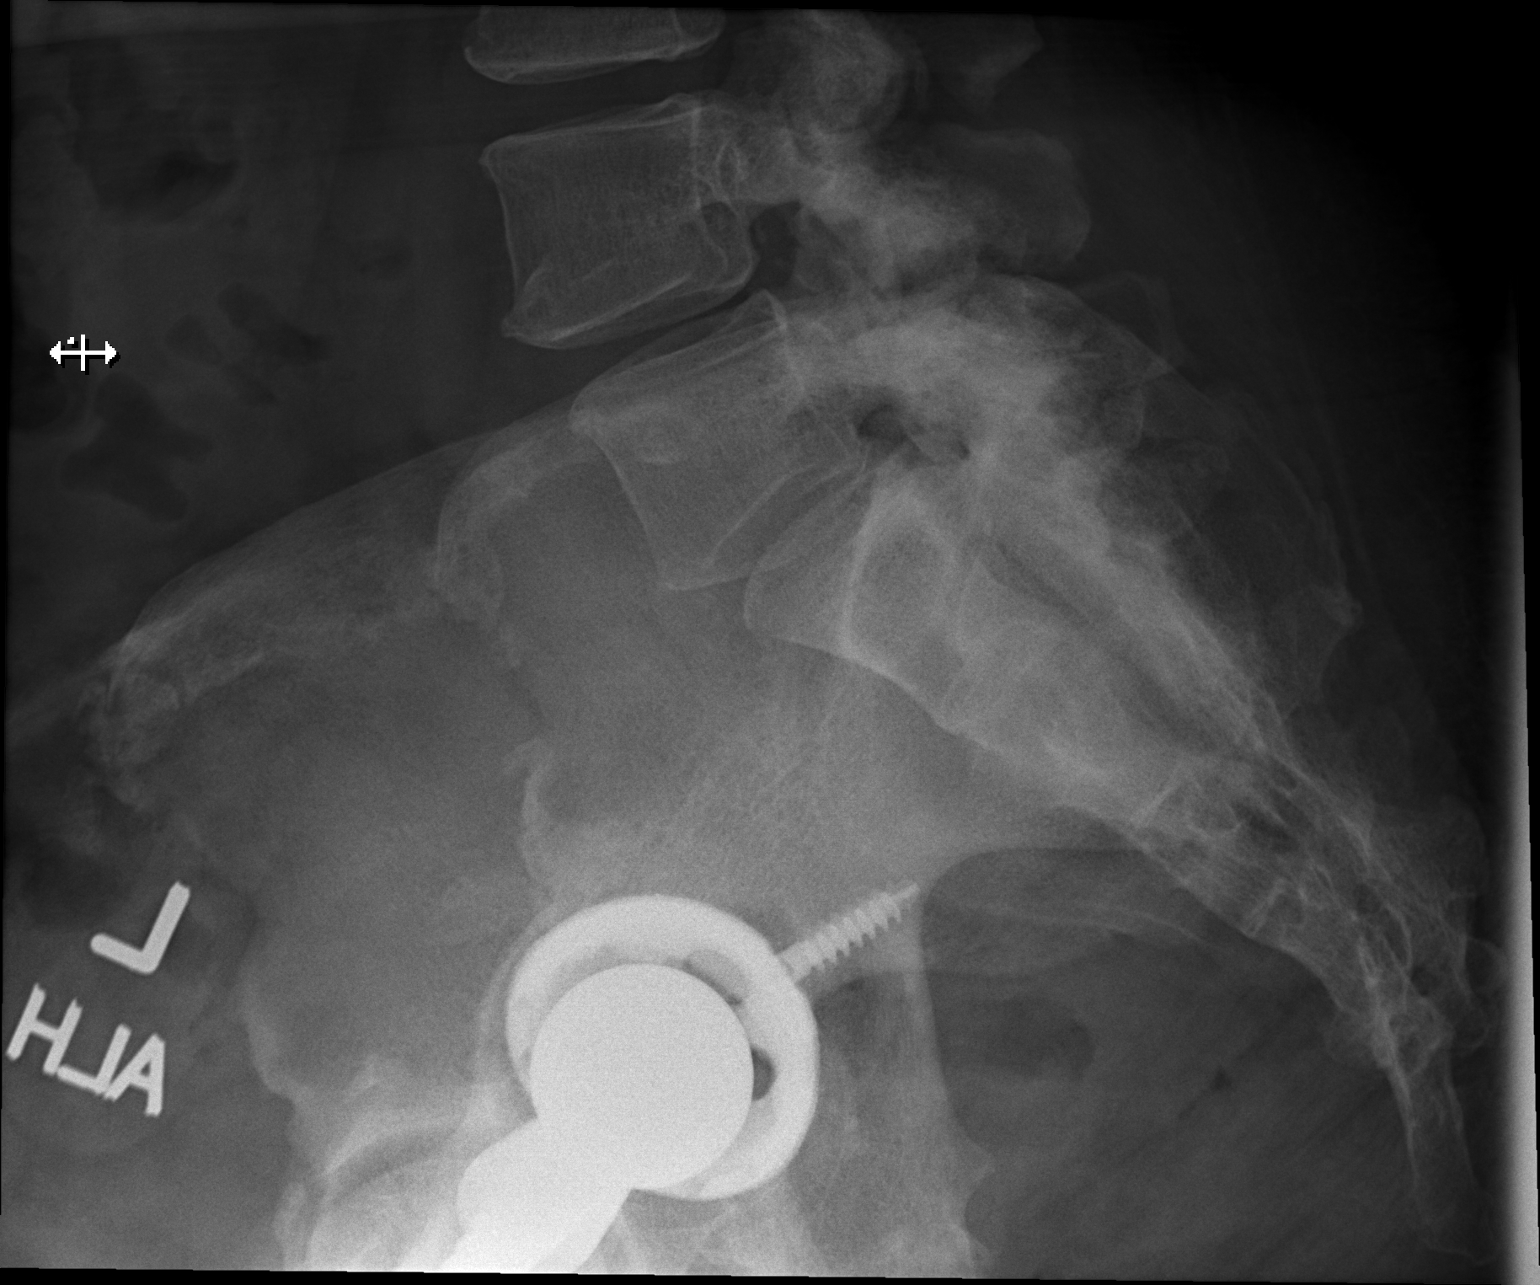

[3 of 3 positions shown; findings below may reference images not displayed]

FINDINGS: No fracture or bone lesion.

Grade 1 anterolisthesis of L4 on L5 and L5 on S1.

Mild loss of disc height at L4-L5 and L5-S1. Minor endplate
osteophytes from L3 through L5.

Facet degenerative change noted at L4-L5 and L5-S1.

Mild levoscoliosis, apex at L3-L4.

Soft tissues are unremarkable.
IMPRESSION: 1. No fracture or acute finding.
2. Degenerative changes of the lower lumbar spine similar to the
prior radiographs.

## 2022-11-18 IMAGING — CR DG HIP (WITH OR WITHOUT PELVIS) 2-3V*L*
2 series · 2 of 2 positions shown · non-contrast
Comparison: 06/21/2019

CLINICAL DATA: Left side back and hip pain that radiates down back
of leg x 1 week. NKI. Hx of left hip replacement

EXAM:
DG HIP (WITH OR WITHOUT PELVIS) 2 v LEFT

[t pelvis ap]
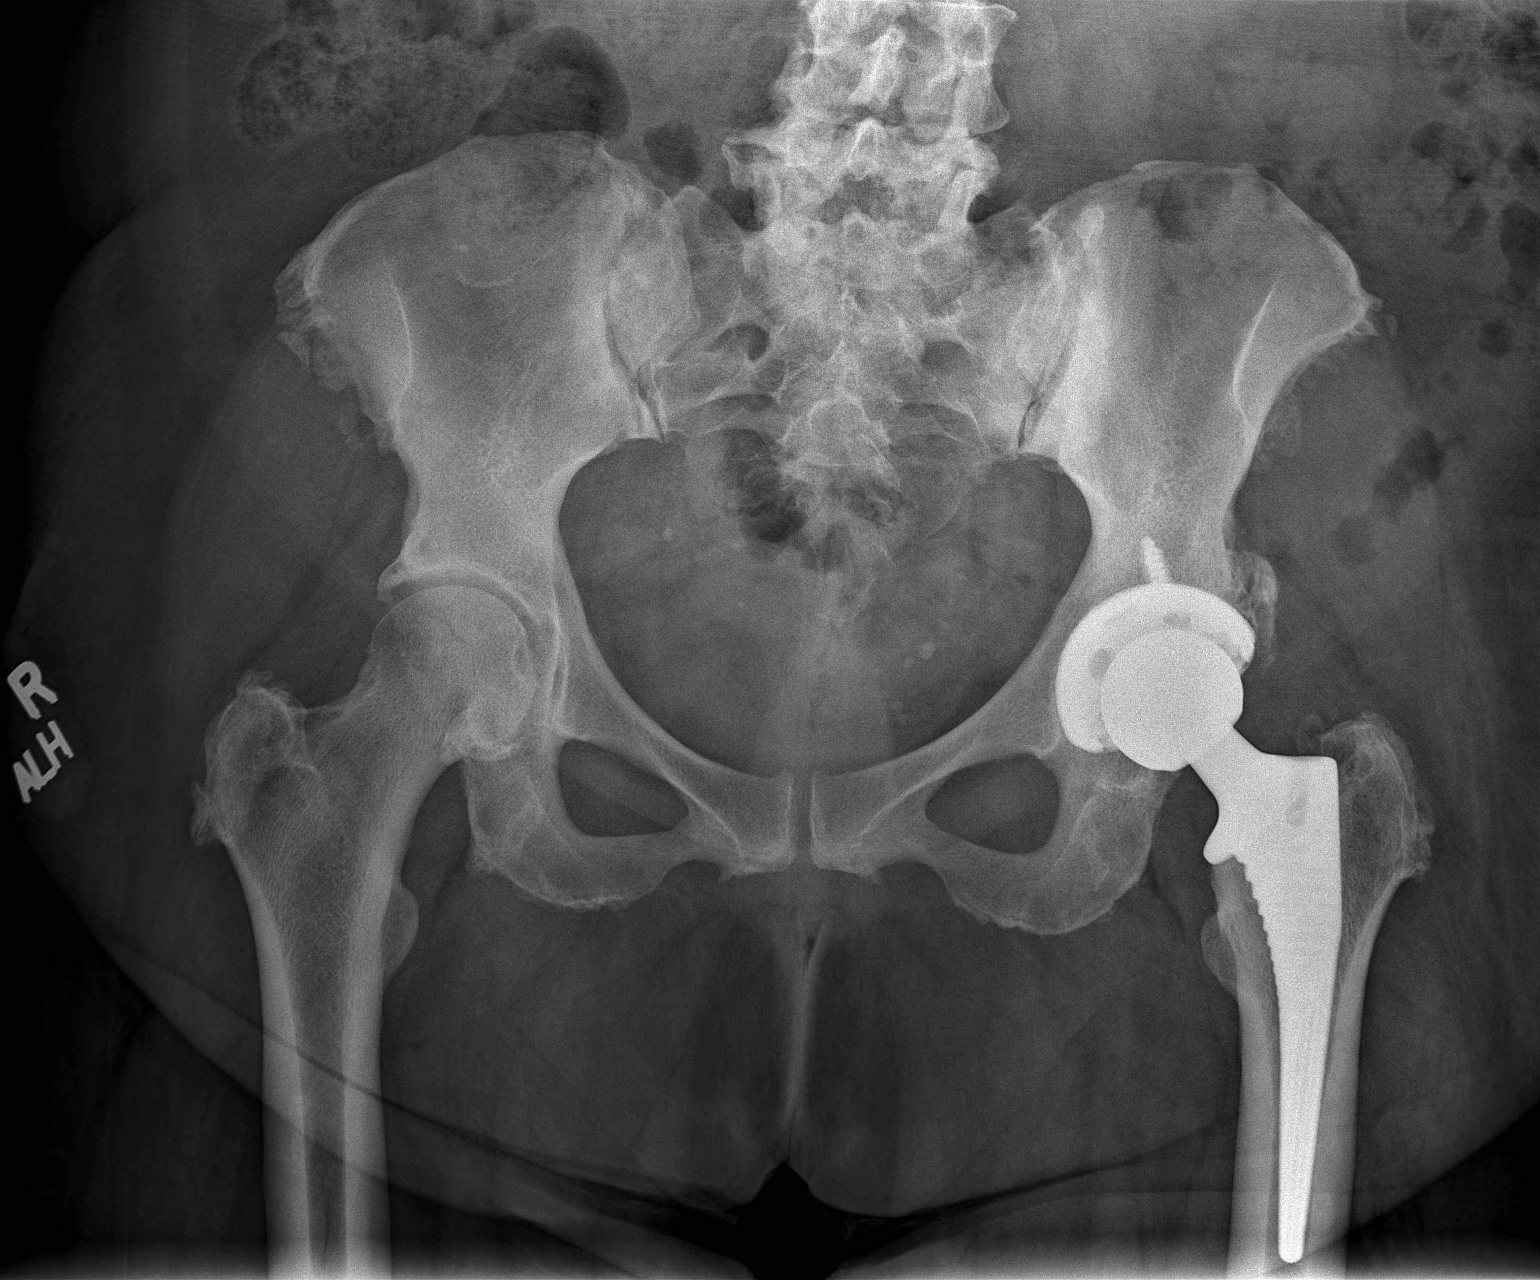

[t hip frog left]
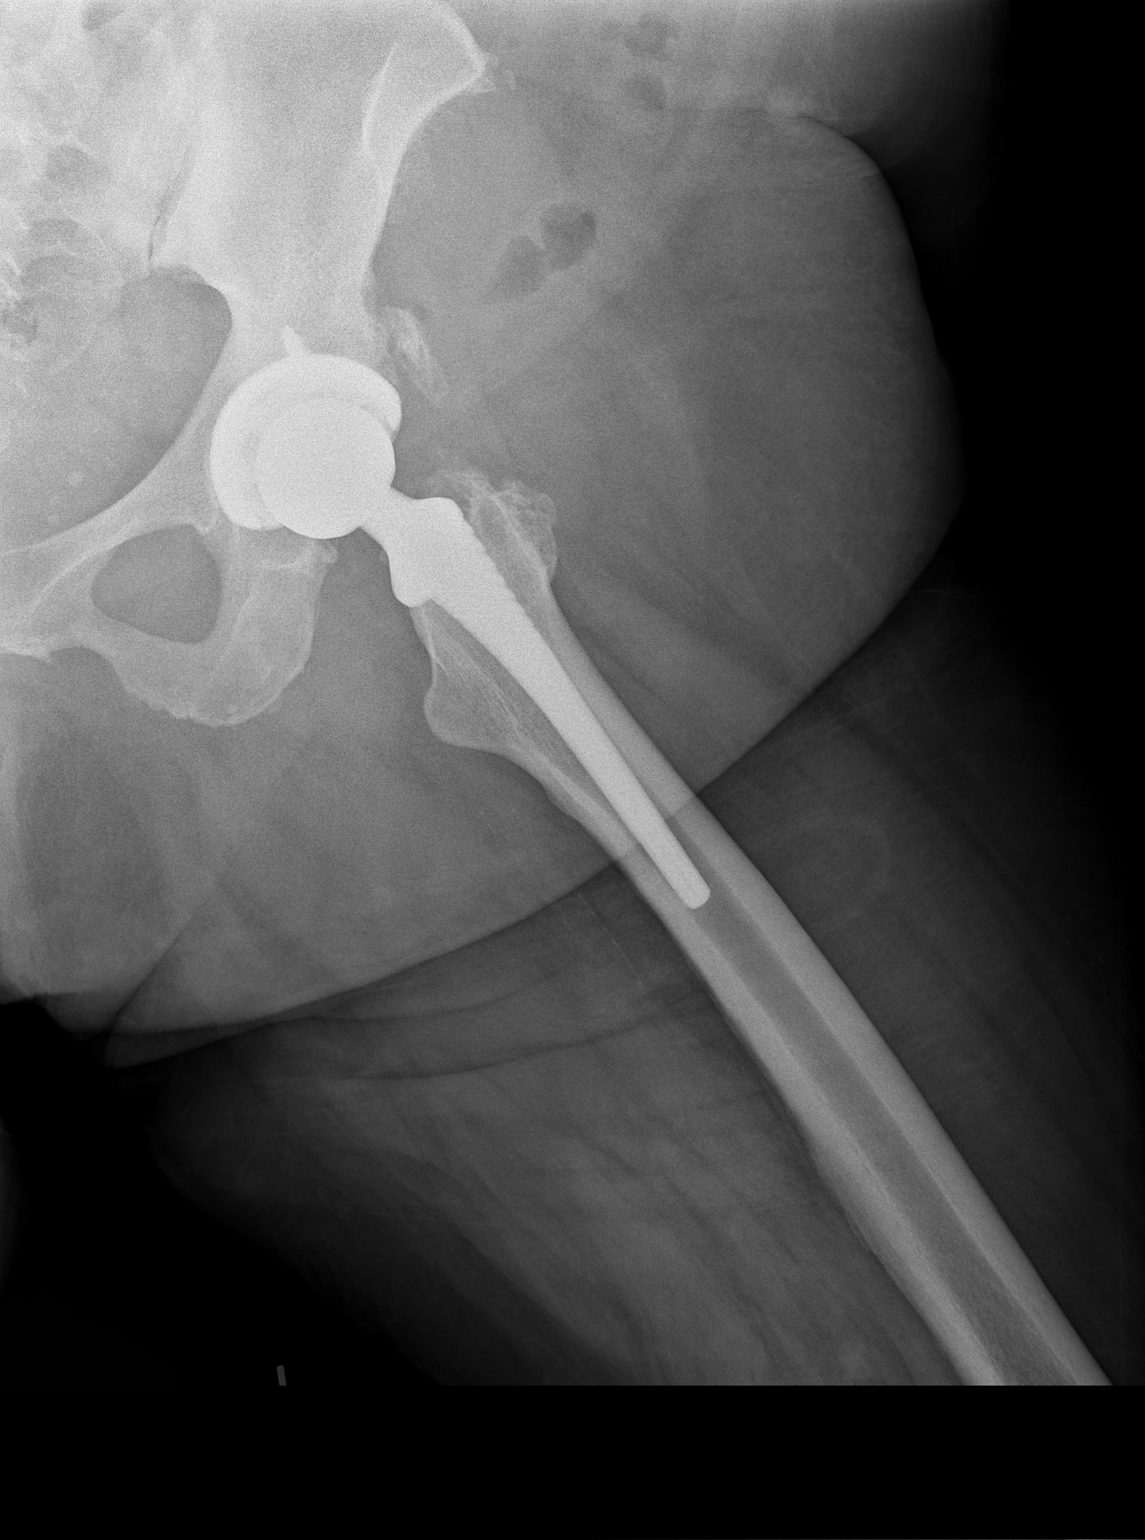

[2 of 2 positions shown; findings below may reference images not displayed]

FINDINGS: No fracture or bone lesion.

Left total hip arthroplasty is well seated and aligned and
unchanged.

Sclerosis noted along the iliac side of both SI joints, asymmetric,
greater on the left, stable.

Right hip joint normally spaced and aligned. SI joint normally
aligned.

Soft tissues are unremarkable.
IMPRESSION: 1. No fracture or acute finding.
2. No evidence of loosening of the left total hip arthroplasty.
3. No change from the prior radiographs.

## 2022-11-20 NOTE — Progress Notes (Signed)
[facility] PREP Evaluation  Patient Details  Name: [name] MRN: [id number] Date of Birth: [birth year] Age: 62 y.o. PCP: Raymon Mutton., FNP  Vitals:   11/20/22 1130  BP: 110/70  Pulse: 76  SpO2: 95%  Weight: 207 lb 9.6 oz (94.2 kg)     [facility] Eval - 11/20/22 1100       [facility] "PREP" Location   [facility] "PREP" Location [facility]      Referral    Referring Provider Katrinka Blazing    Reason for referral Heart Failure;Hypertension;Diabetes    Program Start Date 11/20/22   Restart of Program MW [id number]     Measurement   Waist Circumference 48 inches   over clothes   Hip Circumference 49 inches   over clothes   Body fat 48.9 percent      Information for Trainer   Orthopedic Concerns Goal weight 160lbs, walk without SOB, be able to exercise    Pertinent Medical History DM, CHF, HTN, hx of cancer    Current Barriers none    Restrictions/Precautions Diabetic snack before exercise    Medications that affect exercise Medication causing dizziness/drowsiness      Timed Up and Go (TUGS)   Timed Up and Go Low risk <9 seconds      Mobility and Daily Activities   I find it easy to walk up or down two or more flights of stairs. 1    I have no trouble taking out the trash. 1    I do housework such as vacuuming and dusting on my own without difficulty. 2    I can easily lift a gallon of milk (8lbs). 2    I can easily walk a mile. 1    I have no trouble reaching into high cupboards or reaching down to pick up something from the floor. 2    I do not have trouble doing out-door work such as Loss adjuster, chartered, raking leaves, or gardening. 4      Mobility and Daily Activities   I feel younger than my age. 2    I feel independent. 2    I feel energetic. 2    I live an active life.  3    I feel strong. 2    I feel healthy. 2    I feel active as other people my age. 2      How fit and strong are you.   Fit and Strong Total Score 28            Past Medical History:  Diagnosis  Date   Allergy    Anginal pain (HCC)    a. NL cath in 2008;  b. Myoview 03/2011: dec uptake along mid anterior wall on stress imaging -> ? attenuation vs. ischemia, EF 65%;  c. 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 2008 -> coumadin x 6 mos.   Rheumatoid arthritis (HCC)    Sleep apnea    on CPAP 02/2018   SOB (shortness of breath)    Thyroid disease    TIA (transient ischemic attack)    Urinary urgency    Vitamin D deficiency    Past Surgical History:  Procedure Laterality Date   ABDOMINAL HYSTERECTOMY  2005   APPENDECTOMY     BREAST LUMPECTOMY WITH RADIOACTIVE SEED AND SENTINEL LYMPH NODE BIOPSY Right 11/16/2021   Procedure: RIGHT BREAST RADIOACTIVE SEED LOCALIZED LUMPECTOMY WITH RIGHT AXILLARY SENTINEL LYMPH NODE BIOPSY;  Surgeon: Manus Rudd, MD;  Location: [facility];  Service: General;  Laterality: Right;  LMA & PEC BLOCK   CARDIAC CATHETERIZATION     Normal   CARDIAC CATHETERIZATION N/A 09/30/2014   Procedure: Left Heart Cath and Coronary Angiography;  Surgeon: Tonny Bollman, MD;  Location: [facility] INVASIVE CV LAB;  Service: Cardiovascular;  Laterality: N/A;   LAPAROSCOPIC APPENDECTOMY N/A 06/03/2012   Procedure: APPENDECTOMY LAPAROSCOPIC;  Surgeon: Almond Lint, MD;   Location: [facility] OR;  Service: General;  Laterality: N/A;   Left knee surgery  2008   LEG SURGERY     TONSILLECTOMY     TOTAL HIP ARTHROPLASTY Left 06/11/2018   Procedure: LEFT TOTAL HIP ARTHROPLASTY ANTERIOR APPROACH;  Surgeon: Cammy Copa, MD;  Location: [facility] OR;  Service: Orthopedics;  Laterality: Left;   TOTAL KNEE ARTHROPLASTY Left 08/22/2016   Procedure: TOTAL KNEE ARTHROPLASTY;  Surgeon: Cammy Copa, MD;  Location: [facility] OR;  Service: Orthopedics;  Laterality: Left;   TUBAL LIGATION  1989   Social History   Tobacco Use  Smoking Status Former   Current packs/day: 0.00   Average packs/day: 0.5 packs/day for 15.0 years (7.5 ttl pk-yrs)   Types: Cigarettes   Start date: 04/24/1970   Quit date: 04/24/1985   Years since quitting: 37.6  Smokeless Tobacco Never    Bonnye Fava 11/20/2022, 11:53 AM

## 2022-11-22 NOTE — Progress Notes (Signed)
[facility] PREP Weekly Session  Patient Details  Name: [name] MRN: [id number] Date of Birth: 02/06/1961 Age: 62 y.o. PCP: Raymon Mutton., FNP  There were no vitals filed for this visit.   [facility] Weekly seesion - 11/22/22 1500       [facility] "PREP" Location   [facility] "PREP" Location [facility]      Weekly Session   Topic Discussed Goal setting and welcome to the program   fit testing done   Classes attended to date 2             Bonnye Fava 11/22/2022, 3:58 PM

## 2022-11-23 ENCOUNTER — Encounter: Payer: Self-pay | Admitting: General Practice

## 2022-11-23 NOTE — Progress Notes (Signed)
[facility] Spiritual Care Note  Received phone call from [facility] requesting appointment. Scheduled follow-up appointment in Spiritual Care office on Thursday, August 8 at 10 am.   Konrad Dolores, [facility] Pager [phone] Voicemail [phone]

## 2022-11-28 NOTE — Progress Notes (Signed)
[facility] PREP Weekly Session  Patient Details  Name: [name] MRN: [id number] Date of Birth: Mar 13, 1961 Age: 62 y.o. PCP: Raymon Mutton., FNP  Vitals:   11/27/22 1030  Weight: 207 lb (93.9 kg)     [facility] Weekly seesion - 11/28/22 1100       [facility] "PREP" Location   [facility] "PREP" Location [facility]      Weekly Session   Topic Discussed Importance of resistance training;Other ways to be active    Minutes exercised this week 1396 minutes    Classes attended to date 3             Pam Jerral Bonito 11/28/2022, 11:53 AM

## 2022-11-30 ENCOUNTER — Encounter: Payer: Self-pay | Admitting: General Practice

## 2022-11-30 NOTE — Progress Notes (Signed)
[facility] Spiritual Care Note  Spoke with Ms Delphia by phone. She anticipates hearing from her doctor's office tomorrow about a counseling referral and plans to follow up with chaplain once arrangements are made. We still plan to have at least one Spiritual Care follow-up appointment for closure (and a big hug!).   [address] [name], [place], [facility] Pager [phone] Voicemail [phone]
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
## 2022-12-01 ENCOUNTER — Ambulatory Visit (HOSPITAL_COMMUNITY): Payer: 59 | Attending: Cardiology

## 2022-12-01 DIAGNOSIS — I2089 Other forms of angina pectoris: Secondary | ICD-10-CM | POA: Diagnosis not present

## 2022-12-01 DIAGNOSIS — R079 Chest pain, unspecified: Secondary | ICD-10-CM | POA: Diagnosis not present

## 2022-12-01 LAB — ECHOCARDIOGRAM COMPLETE
Area-P 1/2: 3.65 cm2
Calc EF: 56.2 %
S' Lateral: 3.1 cm
Single Plane A2C EF: 57.3 %
Single Plane A4C EF: 55.8 %

## 2022-12-04 ENCOUNTER — Encounter: Payer: Self-pay | Admitting: Internal Medicine

## 2022-12-04 ENCOUNTER — Ambulatory Visit (INDEPENDENT_AMBULATORY_CARE_PROVIDER_SITE_OTHER): Payer: 59 | Admitting: Internal Medicine

## 2022-12-04 VITALS — BP 122/70 | HR 74 | Ht <= 58 in | Wt 212.8 lb

## 2022-12-04 DIAGNOSIS — E042 Nontoxic multinodular goiter: Secondary | ICD-10-CM

## 2022-12-04 DIAGNOSIS — J9859 Other diseases of mediastinum, not elsewhere classified: Secondary | ICD-10-CM

## 2022-12-04 NOTE — Patient Instructions (Signed)
We will schedule a new thyroid ultrasound.  Please come back in 1 year.

## 2022-12-04 NOTE — Progress Notes (Signed)
Patient ID: [name], female   DOB: [birth year], 62 y.o.   MRN: [id number]  HPI  [name] is a 62 y.o.-year-old female, initially referred by her PCP, Raymon Mutton., FNP, returning for follow-up for thyroid nodules.  Last visit 1 year ago.  Interim history: Before last visit, she was diagnosed with invasive ductal breast cancer -per biopsy 11/16/2021. She had resection. She had RAI tx and ChTx. She was in the emergency room with atypical chest pain x 2, in 10/12/2022 and 11/11/2022.  She was briefly admitted with chest pain in 06/2022. She saw cardiology had the echo which showed an LVEF of 50 to 55%. She was started on Spironolactone.  Reviewed and addended history: 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 She was followed for this by surgery reportedly (no records available for review) with imaging every 3-6 mo. Since no growth was detected >> she did not have surgery.  She was also reported to have a mediastinal mass apparently connected to the thyroid on 2 CT angio studies from 2021 (3 x 2.5 cm in size).  She also has a history of PE in 2008.  She presented with chest pain and cough and was again investigated for PE via a CT chest in 02/2021 that showed a retrosternal likely right thyroid lobe nodule.  Upon questioning, she does mention she noticed pressure in neck and neck swelling 1 year prior to this finding, but this improved  Reviewed available reports/images/pathology results: Chest CT angio (08/14/2019):  Enlarged, heterogeneous and multinodular thyroid including a nodule which extends inferiorly from the right thyroid lobe into the thoracic inlet measuring up to 2.3 cm in size.   Chest CT angio (09/25/2019): Thyroid remains enlarged with a dominant mass arising from the right lobe of the thyroid measuring 3.0 x 2.5 cm. Thyroid otherwise appears somewhat inhomogeneous. There is  no appreciable thoracic adenopathy. Small hiatal hernia noted.  Thyroid U/S (05/27/2020): Parenchymal Echotexture: Moderately heterogenous  Isthmus: 0.7 cm  Right lobe: 6.5 x 3 1 x 2.8 cm  Left lobe: 4.5 x 1.6 x 1.5 cm  _________________________________________________________   Nodule # 1:  Location: Right; Inferior  Maximum size: 3.8 cm; Other 2 dimensions: 3.1 x 2.8 cm  Composition: solid/almost completely solid (2)  Echogenicity: isoechoic (1)  **Given size (>/= 2.5 cm) and appearance, fine needle aspiration of this mildly suspicious nodule should be considered based on TI-RADS criteria.  _________________________________________________________   IMPRESSION: Right inferior solid thyroid nodule (versus pseudonodule given diffusely heterogeneous thyroid parenchyma) measuring up to approximately 3.8 cm, mildly suspicious for malignancy (TI-RADS category 3). This lesion meets criteria for tissue sampling. Recommend ultrasound guided fine needle aspiration.  FNA (11/04/2020): Clinical History: Right inferior 3.8cm; Other 2 dimensions: 3.1 x 2.8cm,  Solid / almost completely solid, Isoechoic, TI-RADS total points 3  Specimen Submitted:  A. THYROID, RLP, FINE NEEDLE ASPIRATION:   FINAL MICROSCOPIC DIAGNOSIS:  - Consistent with benign follicular nodule (Bethesda category II)   SPECIMEN ADEQUACY:  Satisfactory for evaluation   Chest CT with and without contrast (03/07/2021):  Stable, rounded, retrosternal density that is slightly exophytic from the large right thyroid lobe.   Previously seen 3.4 cm right thyroid nodule was not visible anymore.  Thyroid U/S (06/10/2021): Parenchymal Echotexture: Moderately heterogenous Isthmus: 0.7 cm  Right lobe: 6.2 x 2.3 x 2.5 cm  Left lobe: 4.1 x 1.3 x 2.0 cm  _________________________________________________________  Diffusely heterogeneous thyroid gland with extensive goitrous change. Multiple small thyroid nodules are identified in  bilaterally measuring less than 1 cm. These are considered incidental and warrant no further follow-up.   Nodule # 3: Approximately 1.3 cm isoechoic solid nodule in the right mid gland does not meet criteria to warrant further evaluation.   Nodule # 4: Previously biopsied mass in the lower pole of the right gland measures 3.1 x 3.0 x 3.0 cm. This is slightly smaller than 3.1 x 3.1 x 2.8 cm measured previously.   No new nodules or suspicious features.   IMPRESSION: 1. Similar appearance of diffusely enlarged, heterogeneous and multinodular thyroid gland most consistent with multinodular goiter. 2. Slight interval decrease in size of previously biopsied mass in the lower pole of the right gland. 3. No new nodules or suspicious features identified.  CT chest (PE protocol, 11/01/2022): Mediastinum/Nodes: No hilar or mediastinal adenopathy. The esophagus is grossly unremarkable. Asymmetric enlargement of the right thyroid lobe with a 3.7 cm right lobe nodule. This has been evaluated on previous imaging. (ref: J Am Coll Radiol. 2015 Feb;12(2): 143-50).No mediastinal fluid collection.  Pt denies: - feeling nodules in neck - choking - SOB with lying down She has occasional dysphagia with pills.  No hoarseness.  I reviewed pt's thyroid tests: Lab Results  Component Value Date   TSH 0.746 11/07/2022  Prev.: Lab Results  Component Value Date   TSH 0.506 04/23/2021   TSH 0.458 03/05/2019   TSH 0.638 11/20/2017   TSH 0.112 (L) 05/24/2016   TSH 0.345 (L) 06/18/2014   TSH 0.167 (L) 10/28/2012   TSH 0.218 (L) 01/09/2012   TSH 0.681 08/06/2007   TSH 0.559 * 04/05/2007   TSH 0.449 01/16/2007    FREET4 1.39 11/20/2017   FREET4 1.32 08/06/2007    Improved: - fatigue - hot flushes - anxiety/depression  Resolved: - weight gain  No: - tremors - palpitations - hyperdefecation/constipation - dry skin - hair loss  + FH of thyroid ds. - in sister (? Condition). No known FH of  thyroid cancer. No h/o radiation tx to head or neck. No recent contrast studies. No steroid use. No herbal supplements. No Biotin supplements or Hair, Skin and Nails vitamins.  Pt. has a history of DM2 -on Ozempic previously, but currently on metformin and Jardiance. This is managed by PCP.  Latest HbA1c was slightly higher: Lab Results  Component Value Date   HGBA1C 7.3 (H) 07/15/2022   Pt. also has a history of CAD, dCHF, h/o TIA, HL, HTN, OSA (on CPAP), glaucoma, h/o PE in 2008, depression. She was prev. [facility]  ROS: + See HPI  Past Medical History:  Diagnosis Date   Allergy    Anginal pain (HCC)    a. NL cath in 2008;  b. Myoview 03/2011: dec uptake along mid anterior wall on stress imaging -> ? attenuation vs. ischemia, EF 65%;  c. Echo 04/2011: EF 55-60%, no RWMA, Gr 2 dd   Anxiety    Arthritis    Asthma    Back pain    Bone cancer (HCC)    Cancer (HCC)    Chest pain    CHF (congestive heart failure) (HCC)    Clotting disorder (HCC)    Constipation    Depression    Diabetes mellitus    Drug use    Dyspnea    Frequent urination    GERD (gastroesophageal reflux disease)    Glaucoma    History  of stomach ulcers    HLD (hyperlipidemia)    Hypertension    IBS (irritable bowel syndrome)    Joint pain    Joint pain    Lactose intolerance    Leg edema    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 2008 -> coumadin x 6 mos.   Rheumatoid arthritis (HCC)    Sleep apnea    on CPAP 02/2018   SOB (shortness of breath)    Thyroid disease    TIA (transient ischemic attack)    Urinary urgency    Vitamin D deficiency    Past Surgical History:  Procedure Laterality Date   ABDOMINAL HYSTERECTOMY  2005   APPENDECTOMY     BREAST LUMPECTOMY WITH RADIOACTIVE SEED AND SENTINEL LYMPH NODE BIOPSY Right  11/16/2021   Procedure: RIGHT BREAST RADIOACTIVE SEED LOCALIZED LUMPECTOMY WITH RIGHT AXILLARY SENTINEL LYMPH NODE BIOPSY;  Surgeon: Manus Rudd, MD;  Location: [facility];  Service: General;  Laterality: Right;  LMA & PEC BLOCK   CARDIAC CATHETERIZATION     Normal   CARDIAC CATHETERIZATION N/A 09/30/2014   Procedure: Left Heart Cath and Coronary Angiography;  Surgeon: Tonny Bollman, MD;  Location: [facility] INVASIVE CV LAB;  Service: Cardiovascular;  Laterality: N/A;   LAPAROSCOPIC APPENDECTOMY N/A 06/03/2012   Procedure: APPENDECTOMY LAPAROSCOPIC;  Surgeon: Almond Lint, MD;  Location: [facility] OR;  Service: General;  Laterality: N/A;   Left knee surgery  2008   LEG SURGERY     TONSILLECTOMY     TOTAL HIP ARTHROPLASTY Left 06/11/2018   Procedure: LEFT TOTAL HIP ARTHROPLASTY ANTERIOR APPROACH;  Surgeon: Cammy Copa, MD;  Location: [facility] OR;  Service: Orthopedics;  Laterality: Left;   TOTAL KNEE ARTHROPLASTY Left 08/22/2016   Procedure: TOTAL KNEE ARTHROPLASTY;  Surgeon: Cammy Copa, MD;  Location: [facility] OR;  Service: Orthopedics;  Laterality: Left;   TUBAL LIGATION  1989   Social History   Socioeconomic History   Marital status: Divorced    Spouse name: Not on file   Number of children: 3   Years of education: Not on file   Highest education level: Not on file  Occupational History   Occupation: UNEMPLOYED    Employer: DISABLED  Tobacco Use   Smoking status: Former    Current packs/day: 0.00    Average packs/day: 0.5 packs/day for 15.0 years (7.5 ttl pk-yrs)    Types: Cigarettes    Start date: 04/24/1970    Quit date: 04/24/1985    Years since quitting: 37.6   Smokeless tobacco: Never  Vaping Use   Vaping status: Never Used  Substance and Sexual Activity   Alcohol use: No    Alcohol/week: 0.0 standard drinks of alcohol   Drug use: Not Currently    Types: Marijuana   Sexual activity: Not Currently    Birth control/protection: Surgical    Comment: hyst  Other Topics  Concern   Not on file  Social History Narrative   Lives in [place] by herself.  Disabled.  Occasional Tea .  3 kids, 6 grandkids.     Water aerobics 3 times a week.   Social Determinants of Health   Financial Resource Strain: High Risk (03/27/2022)   Overall Financial Resource Strain (CARDIA)    Difficulty  of Paying Living Expenses: Very hard  Food Insecurity: Food Insecurity Present (03/27/2022)   Hunger Vital Sign    Worried About Running Out of Food in the Last Year: Sometimes true    Ran Out of Food in the Last Year: Sometimes true  Transportation Needs: No Transportation Needs (03/27/2022)   PRAPARE - Administrator, Civil Service (Medical): No    Lack of Transportation (Non-Medical): No  Physical Activity: Not on file  Stress: Not on file  Social Connections: Not on file  Intimate Partner Violence: Not At Risk (03/28/2022)   Humiliation, Afraid, Rape, and Kick questionnaire    Fear of Current or Ex-Partner: No    Emotionally Abused: No    Physically Abused: No    Sexually Abused: No   Current Outpatient Medications on File Prior to Visit  Medication Sig Dispense Refill   ACCU-CHEK GUIDE test strip      acetaminophen (TYLENOL) 500 MG tablet Take 1,000 mg by mouth 2 (two) times daily as needed for moderate pain.     albuterol (PROVENTIL HFA;VENTOLIN HFA) 108 (90 Base) MCG/ACT inhaler Inhale 2 puffs into the lungs every 6 (six) hours as needed for wheezing or shortness of breath.     aspirin 81 MG chewable tablet Chew 81 mg by mouth daily.     atorvastatin (LIPITOR) 40 MG tablet Take 40 mg by mouth at bedtime.      busPIRone (BUSPAR) 30 MG tablet Take 30 mg by mouth 2 (two) times daily.     carvedilol (COREG) 6.25 MG tablet Take 1 tablet (6.25 mg total) by mouth 2 (two) times daily. 180 tablet 3   diclofenac Sodium (VOLTAREN) 1 % GEL Apply 2 g topically 4 (four) times daily as needed (joint/muscle pain).     empagliflozin (JARDIANCE) 10 MG TABS tablet Take 1 tablet (10 mg  total) by mouth daily. 30 tablet 0   EPINEPHrine 0.3 mg/0.3 mL IJ SOAJ injection Inject 0.3 mg into the muscle as needed for anaphylaxis. Follow package instructions as needed for severe allergy or anaphylactic reaction. 2 each 3   furosemide (LASIX) 40 MG tablet Take 1 tablet (40 mg total) by mouth daily as needed for edema or fluid (take in case of weight gain 2 to 3 lbs in 24 hrs or 5 lbs in 7 days.). 30 tablet 0   hydrOXYzine (VISTARIL) 25 MG capsule Take 25 mg by mouth 3 (three) times daily as needed for itching.     lidocaine (LIDODERM) 5 % Place 1 patch onto the skin daily as needed for pain.     lidocaine-prilocaine (EMLA) cream Apply to affected area once (Patient taking differently: Apply 1 Application topically once. Apply to affected area once) 30 g 3   loperamide (IMODIUM) 2 MG capsule Take 1 capsule (2 mg total) by mouth 4 (four) times daily as needed for diarrhea or loose stools. 12 capsule 0   losartan (COZAAR) 25 MG tablet Take 1 tablet (25 mg total) by mouth daily. 30 tablet 0   metFORMIN (GLUCOPHAGE) 500 MG tablet Take 1 tablet (500 mg total) by mouth daily with breakfast. 30 tablet 0   methocarbamol (ROBAXIN) 500 MG tablet as needed for muscle spasms.     nitroGLYCERIN (NITROSTAT) 0.4 MG SL tablet Place 1 tablet (0.4 mg total) under the tongue every 5 (five) minutes as needed for chest pain. 25 tablet 3   nystatin (MYCOSTATIN/NYSTOP) powder Apply 1 application topically 3 (three) times daily as needed for rash.  ondansetron (ZOFRAN) 8 MG tablet as needed for nausea or vomiting.     ondansetron (ZOFRAN-ODT) 4 MG disintegrating tablet Take 1 tablet (4 mg total) by mouth every 8 (eight) hours as needed for nausea or vomiting. 20 tablet 0   oxyCODONE (OXY IR/ROXICODONE) 5 MG immediate release tablet Take 1 tablet by mouth every 6 (six) hours as needed.     pantoprazole (PROTONIX) 40 MG tablet Take 1 tablet (40 mg total) by mouth 2 (two) times daily. (Patient taking differently: Take  40 mg by mouth daily.) 60 tablet 0   prochlorperazine (COMPAZINE) 10 MG tablet Take 1 tablet (10 mg total) by mouth every 6 (six) hours as needed for nausea or vomiting. 30 tablet 1   sertraline (ZOLOFT) 25 MG tablet Take 1 tablet (25 mg total) by mouth daily. 90 tablet 0   spironolactone (ALDACTONE) 25 MG tablet Take 1/2 tablet (12.5 mg total) by mouth daily. (Patient taking differently: Take 25 mg by mouth every other day.) 15 tablet 0   Vitamin D, Ergocalciferol, (DRISDOL) 1.25 MG (50000 UNIT) CAPS capsule Take 1 capsule (50,000 Units total) by mouth every 7 (seven) days. 4 capsule 0   No current facility-administered medications on file prior to visit.   Allergies  Allergen Reactions   Hydrocodone-Acetaminophen Itching and Other (See Comments)    confusion   Penicillins Swelling and Other (See Comments)    Has patient had a PCN reaction causing immediate rash, facial/tongue/throat swelling, SOB or lightheadedness with hypotension: YES Has patient had a PCN reaction causing severe rash involving mucus membranes or skin necrosis: NO Has patient had a PCN reaction that required hospitalization NO Has patient had a PCN reaction occurring within the last 10 years: NO If all of the above answers are "NO", then may proceed with Cephalosporin use.   Shellfish Allergy Hives   Latex Rash   Sulfa Antibiotics Diarrhea, Itching and Rash   Family History  Problem Relation Age of Onset   Emphysema Mother    Arthritis Mother    Heart failure Mother        alive @ 66   Stroke Mother    Diabetes Mother    Hypertension Mother    Hyperlipidemia Mother    Depression Mother    Anxiety disorder Mother    Heart disease Mother    Alcoholism Mother    Depression Father    Heart disease Father        died @ 41's.   Stroke Father    Diabetes Father    Hyperlipidemia Father    Hypertension Father    Bipolar disorder Father    Sleep apnea Father    Alcoholism Father    Drug abuse Father     Sudden death Father    Diabetes Sister    Asthma Brother    Breast cancer Maternal Aunt 30 - 21   Colon cancer Maternal Grandfather        dx after 50   Heart disease Paternal Grandfather    Breast cancer Cousin        dx 57s   Esophageal cancer Neg Hx    Stomach cancer Neg Hx    Rectal cancer Neg Hx    PE: BP 122/70   Pulse 74   Ht 4\' 10"  (1.473 m)   Wt 212 lb 12.8 oz (96.5 kg)   SpO2 96%   BMI 44.48 kg/m  Wt Readings from Last 3 Encounters:  12/04/22 212 lb 12.8 oz (96.5  kg)  11/27/22 207 lb (93.9 kg)  11/20/22 207 lb 9.6 oz (94.2 kg)   Constitutional: overweight, in NAD Eyes: EOMI, no exophthalmos ENT: + R thyromegaly, no cervical lymphadenopathy Cardiovascular: RRR, No MRG Respiratory: CTA B Musculoskeletal: no deformities Skin: no rashes Neurological: no tremor with outstretched hands  ASSESSMENT: 1. Thyroid nodules  2.  Mediastinal mass  PLAN: 1. Thyroid nodules -Patient with a history of multiple thyroid nodules, of which the dominant nodule was found in the right gland and extending towards mediastinum.  This measured 3.8 cm initially but then involuted to 3.1 cm.  We biopsied this nodule 2 years ago and the results are benign -She also has several smaller nodules which appear to be low risk and one R mid thyroid nodule that had several benign features: Isoechoic, without microcalcifications or internal blood flow, more wide than tall and with regular margins. -Also, patient does not have a thyroid cancer family history or personal history of radiation therapy to head or neck, always favoring benignity -At last visit we discussed about keeping an eye on her thyroid nodules until 5 years of stability or involution is documented -At this visit, we will repeat her thyroid ultrasound.  We did review together the results of her most recent neck CT from 11/01/2022: Asymmetric enlargement of the right thyroid with a 3.7 cm nodule.  We discussed that CT measurements are  not as accurate as ultrasound measurements. -No intervention is needed for now, but will need to update the recommendation based on the results of the thyroid ultrasound -Latest TFTs were reviewed and they were normal in 10/2022 - I will see her back in 1 year  2.  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 Nodule does not meet criteria for surveillance.   Nodule labeled 2 inferior right thyroid, 4.0 cm relatively unchanged. Nodule has undergone prior biopsy 06/09/2021. Assuming benign result no further specific follow-up would be indicated.   No adenopathy   IMPRESSION: Similar appearance of thyroid, including relatively unchanged nodule inferior right thyroid gland. This nodule has undergone prior biopsy and assuming benign result no further specific follow-up would be indicated.  Carlus Pavlov, MD PhD [facility] Endocrinology

## 2022-12-05 ENCOUNTER — Other Ambulatory Visit: Payer: Self-pay

## 2022-12-07 ENCOUNTER — Ambulatory Visit
Admission: RE | Admit: 2022-12-07 | Discharge: 2022-12-07 | Disposition: A | Discharge status: Home / Self Care | Payer: 59 | Source: Ambulatory Visit | Attending: Internal Medicine | Admitting: Internal Medicine

## 2022-12-07 DIAGNOSIS — E042 Nontoxic multinodular goiter: Secondary | ICD-10-CM

## 2022-12-07 NOTE — Progress Notes (Signed)
[facility] PREP Weekly Session  Patient Details  Name: [name] MRN: [id number] Date of Birth: 1961-02-15 Age: 62 y.o. PCP: Raymon Mutton., FNP  Vitals:   12/04/22 1030  Weight: 206 lb (93.4 kg)     [facility] Weekly seesion - 12/07/22 1600       [facility] "PREP" Location   [facility] "PREP" Location [facility]      Weekly Session   Topic Discussed Healthy eating tips    Minutes exercised this week 435 minutes    Classes attended to date 4             Pam Jerral Bonito 12/07/2022, 4:59 PM

## 2022-12-11 ENCOUNTER — Ambulatory Visit (INDEPENDENT_AMBULATORY_CARE_PROVIDER_SITE_OTHER): Payer: 59 | Admitting: Internal Medicine

## 2022-12-11 ENCOUNTER — Other Ambulatory Visit (INDEPENDENT_AMBULATORY_CARE_PROVIDER_SITE_OTHER): Payer: Self-pay | Admitting: Adult Health

## 2022-12-11 ENCOUNTER — Encounter (INDEPENDENT_AMBULATORY_CARE_PROVIDER_SITE_OTHER): Payer: Self-pay | Admitting: Internal Medicine

## 2022-12-11 VITALS — BP 125/83 | HR 72 | Temp 98.0°F | Ht 59.0 in | Wt 207.0 lb

## 2022-12-11 DIAGNOSIS — I5032 Chronic diastolic (congestive) heart failure: Secondary | ICD-10-CM | POA: Diagnosis not present

## 2022-12-11 DIAGNOSIS — Z6841 Body Mass Index (BMI) 40.0 and over, adult: Secondary | ICD-10-CM | POA: Diagnosis not present

## 2022-12-11 DIAGNOSIS — E669 Obesity, unspecified: Secondary | ICD-10-CM | POA: Diagnosis not present

## 2022-12-11 DIAGNOSIS — E1165 Type 2 diabetes mellitus with hyperglycemia: Secondary | ICD-10-CM

## 2022-12-11 DIAGNOSIS — E559 Vitamin D deficiency, unspecified: Secondary | ICD-10-CM

## 2022-12-11 DIAGNOSIS — Z7985 Long-term (current) use of injectable non-insulin antidiabetic drugs: Secondary | ICD-10-CM

## 2022-12-11 DIAGNOSIS — Z7984 Long term (current) use of oral hypoglycemic drugs: Secondary | ICD-10-CM

## 2022-12-11 NOTE — Assessment & Plan Note (Signed)
This patient has complex obesity.  She is on several weight promoting medications, has sleep apnea, depression and has chronic skipping of meals.  I do not feel that an appetite suppressant may be of benefit to patient at present time.  I encouraged her to reach out to her insurance to see if she could connect with a therapist as she would benefit from counseling.  She is currently on SSRI therapy as her primary care team.  I would like for her to work on increasing protein to 30 to 40 g per meal.  She is also starting to do an aerobic program which I highly encourage.  She will also work on not skipping meals but eating when she is hungry without force-feeding herself.

## 2022-12-11 NOTE — Assessment & Plan Note (Signed)
We are checking hemoglobin A1c.  She is currently on Jardiance and metformin.  Had been on Ozempic in the past discontinued because of weight gain and other side effects.  Continue current regimen.  Continue with medical weight management.

## 2022-12-11 NOTE — Assessment & Plan Note (Signed)
I reviewed most recent echocardiogram shows preserved ejection fraction some mild apical hypokinesis.  At present time there are no signs of symptoms of decompensated heart failure.  Blood pressure is well-controlled.  She is on Aldactone, ARB, SGLT2 and furosemide.  No signs or symptoms of orthostasis.  Most recent electrolytes and renal parameters within normal limits.  Continue current regimen

## 2022-12-11 NOTE — Progress Notes (Signed)
Office: [phone]  /  Fax: [phone]  WEIGHT SUMMARY AND BIOMETRICS  Vitals Temp: 98 F (36.7 C) BP: 125/83 Pulse Rate: 72 SpO2: 98 %   Anthropometric Measurements Height: 4\' 11"  (1.499 m) Weight: 207 lb (93.9 kg) BMI (Calculated): 41.79 Weight at Last Visit: 201 lb Weight Lost Since Last Visit: 0 lb Weight Gained Since Last Visit: 6 lb Starting Weight: 209 lb Total Weight Loss (lbs): 2 lb (0.907 kg)   Body Composition  Body Fat %: 50.5 % Fat Mass (lbs): 105 lbs Muscle Mass (lbs): 97.6 lbs Total Body Water (lbs): 77.6 lbs Visceral Fat Rating : 17    No data recorded Today's Visit #: 15  Starting Date: 06/01/21   HPI  Chief Complaint: OBESITY  [name] is here to discuss her progress with her obesity treatment plan. [name] is on the the Category 2 Plan and states [name] is following her eating plan approximately 80 % of the time. [name] states [name] is exercising 20-45 minutes 2 times per week.  Interval History:  This is my first encounter with [name].  Since last office visit [name] has gained 6 pounds.  [name] denies history of childhood obesity.  [name] reports gaining weight due to stress, chronic pain and orthopedic problems limiting physical activity.  [name] had been on Ozempic in the past and gained weight on the medication.  Ultimately was discontinued because of GI side effects in the form of stomach pain.  [name] describes herself as a "light eater", 24-hour recall: Breakfast was special K protein cereal about a cup and a third and a couple of fair life milk.  [name] skipped lunch.  [name] reports skipping meals when [name] is depressed.  Dinner have potatoes and broccoli.  [name] notes chronic skipping of meals.  [name] is starting to or will start going to aerobic classes.  Orixegenic Control: Denies problems with appetite and hunger signals.  Denies problems with satiety and satiation.  Denies problems with eating patterns and portion control.  Denies abnormal  cravings. Denies feeling deprived or restricted.   Barriers identified: presence of obesogenic drugs, inadequate sleep, and depressed mood .   Pharmacotherapy for weight loss: [name] is currently taking no anti-obesity medication.    ASSESSMENT AND PLAN  TREATMENT PLAN FOR OBESITY:  Recommended Dietary Goals  [name] is currently in the action stage of change. As such, her goal is to continue weight management plan. [name] has agreed to: continue current plan  Behavioral Intervention  We discussed the following Behavioral Modification Strategies today: increasing lean protein intake, decreasing simple carbohydrates , increasing vegetables, increasing lower glycemic fruits, increasing fiber rich foods, avoiding skipping meals, increasing water intake, and planning for success.  Additional resources provided today: None  Recommended Physical Activity Goals  [name] has been advised to work up to 150 minutes of moderate intensity aerobic activity a week and strengthening exercises 2-3 times per week for cardiovascular health, weight loss maintenance and preservation of muscle mass.   [name] has agreed to :  Think about ways to increase daily physical activity and overcoming barriers to exercise and Increase physical activity in their day and reduce sedentary time (increase NEAT).  Pharmacotherapy We discussed various medication options to help [name] with her weight loss efforts and we both agreed to : continue with nutritional and behavioral strategies  ASSOCIATED CONDITIONS ADDRESSED TODAY  Type 2 diabetes mellitus with hyperglycemia, without long-term current use of insulin (HCC) Assessment & Plan: We are checking hemoglobin A1c.  [name] is  currently on Jardiance and metformin.  Had been on Ozempic in the past discontinued because of weight gain and other side effects.  Continue current regimen.  Continue with medical weight management.  Orders: -     Hemoglobin A1c  Obesity, current BMI  of 40.58 Assessment & Plan: This patient has complex obesity.  [name] is on several weight promoting medications, has sleep apnea, depression and has chronic skipping of meals.  I do not feel that an appetite suppressant may be of benefit to patient at present time.  I encouraged her to reach out to her insurance to see if [name] could connect with a therapist as [name] would benefit from counseling.  [name] is currently on SSRI therapy as her primary care team.  I would like for her to work on increasing protein to 30 to 40 g per meal.  [name] is also starting to do an aerobic program which I highly encourage.  [name] will also work on not skipping meals but eating when [name] is hungry without force-feeding herself.   Chronic diastolic congestive heart failure (HCC) Assessment & Plan: I reviewed most recent echocardiogram shows preserved ejection fraction some mild apical hypokinesis.  At present time there are no signs of symptoms of decompensated heart failure.  Blood pressure is well-controlled.  [name] is on Aldactone, ARB, SGLT2 and furosemide.  No signs or symptoms of orthostasis.  Most recent electrolytes and renal parameters within normal limits.  Continue current regimen     PHYSICAL EXAM:  Blood pressure 125/83, pulse 72, temperature 98 F (36.7 C), height 4\' 11"  (1.499 m), weight 207 lb (93.9 kg), SpO2 98%. Body mass index is 41.81 kg/m.  General: [name] is overweight, cooperative, alert, well developed, and in no acute distress. PSYCH: Has normal mood, affect and thought process.   HEENT: EOMI, sclerae are anicteric. Lungs: Normal breathing effort, no conversational dyspnea. Extremities: No edema.  Neurologic: No gross sensory or motor deficits. No tremors or fasciculations noted.    DIAGNOSTIC DATA REVIEWED:  BMET    Component Value Date/Time   NA 143 11/01/2022 1343   NA 142 04/18/2022 1124   K 4.0 11/01/2022 1343   CL 109 11/01/2022 1343   CO2 26 11/01/2022 1343   GLUCOSE 118 (H) 11/01/2022  1343   BUN 19 11/01/2022 1343   BUN 13 04/18/2022 1124   CREATININE 1.00 11/01/2022 1343   CREATININE 0.85 03/03/2022 1250   CALCIUM 9.7 11/01/2022 1343   GFRNONAA >60 11/01/2022 1343   GFRNONAA >60 03/03/2022 1250   GFRAA 76 02/03/2020 0845   Lab Results  Component Value Date   HGBA1C 7.3 (H) 07/15/2022   HGBA1C 7.0 (H) 05/26/2009   Lab Results  Component Value Date   INSULIN 13.4 08/14/2022   INSULIN 53.9 (H) 11/20/2017   Lab Results  Component Value Date   TSH 0.746 11/07/2022   CBC    Component Value Date/Time   WBC 6.9 11/01/2022 1343   RBC 4.93 11/01/2022 1343   HGB 14.4 11/01/2022 1343   HGB 10.5 (L) 03/03/2022 1250   HGB 12.8 11/20/2017 0925   HCT 45.1 11/01/2022 1343   HCT 40.6 11/20/2017 0925   PLT 214 11/01/2022 1343   PLT 226 03/03/2022 1250   MCV 91.5 11/01/2022 1343   MCV 85 11/20/2017 0925   MCH 29.2 11/01/2022 1343   MCHC 31.9 11/01/2022 1343   RDW 14.1 11/01/2022 1343   RDW 13.9 11/20/2017 0925   Iron Studies No results found for: "IRON", "  TIBC", "FERRITIN", "IRONPCTSAT" Lipid Panel     Component Value Date/Time   CHOL 127 07/16/2022 0145   CHOL 127 09/28/2021 0951   TRIG 119 07/16/2022 0145   HDL 48 07/16/2022 0145   HDL 48 09/28/2021 0951   CHOLHDL 2.6 07/16/2022 0145   VLDL 24 07/16/2022 0145   LDLCALC 55 07/16/2022 0145   LDLCALC 60 09/28/2021 0951   Hepatic Function Panel     Component Value Date/Time   PROT 6.1 (L) 10/15/2022 1143   PROT 6.6 09/28/2021 0951   ALBUMIN 3.4 (L) 10/15/2022 1143   ALBUMIN 4.3 09/28/2021 0951   AST 19 10/15/2022 1143   AST 14 (L) 03/03/2022 1250   ALT 24 10/15/2022 1143   ALT 18 03/03/2022 1250   ALKPHOS 58 10/15/2022 1143   BILITOT 0.6 10/15/2022 1143   BILITOT 0.5 03/03/2022 1250   BILIDIR <0.1 05/19/2020 1600   IBILI NOT CALCULATED 05/19/2020 1600      Component Value Date/Time   TSH 0.746 11/07/2022 1133   Nutritional Lab Results  Component Value Date   VD25OH 30.3 08/14/2022    VD25OH 39.8 09/28/2021   VD25OH 30.0 06/01/2021     Return in about 4 weeks (around 01/08/2023) for with Ether Griffins or Dr. Lawson Radar.. [name] was informed of the importance of frequent follow up visits to maximize her success with intensive lifestyle modifications for her multiple health conditions.   ATTESTASTION STATEMENTS:  Reviewed by clinician on day of visit: allergies, medications, problem list, medical history, surgical history, family history, social history, and previous encounter notes.     Worthy Rancher, MD

## 2022-12-12 ENCOUNTER — Telehealth: Payer: Self-pay | Admitting: Cardiovascular Disease

## 2022-12-12 ENCOUNTER — Ambulatory Visit (INDEPENDENT_AMBULATORY_CARE_PROVIDER_SITE_OTHER): Payer: 59 | Admitting: Adult Health

## 2022-12-12 DIAGNOSIS — I2089 Other forms of angina pectoris: Secondary | ICD-10-CM

## 2022-12-12 LAB — HEMOGLOBIN A1C
Est. average glucose Bld gHb Est-mCnc: 180 mg/dL
Hgb A1c MFr Bld: 7.9 % — ABNORMAL HIGH (ref 4.8–5.6)

## 2022-12-12 NOTE — Progress Notes (Signed)
[facility] PREP Weekly Session  Patient Details  Name: [name] MRN: [id number] Date of Birth: [birth year] Age: 62 y.o. PCP: Raymon Mutton., FNP  Vitals:   12/11/22 1030  Weight: 207 lb (93.9 kg)     [facility] Weekly seesion - 12/12/22 1600       [facility] "PREP" Location   [facility] "PRE[facility]" Location [facility]      Weekly Session   Topic Discussed Health habits    Minutes exercised this week 405 minutes    Classes attended to date 6             Bonnye Fava 12/12/2022, 4:59 PM

## 2022-12-12 NOTE — Telephone Encounter (Signed)
Patient is returning call to discuss echo results. °

## 2022-12-12 NOTE — Telephone Encounter (Signed)
Spoke with patient about recent echo results, patient prefers to use treadmill for stress test. Order placed.        Sharlene Dory, PA-C 12/06/2022  1:30 PM EDT     [name],   I think we need to go ahead and set up a stress test for you.  I would prefer to do a treadmill stress test but if you do not think you can walk on a treadmill we can do the chemical stress test (Lexiscan Myoview).   After discussion with Dr. Anne Fu you have a small part of your heart that is not moving like the rest.  With your symptoms of chest pain I think it is worth further evaluation.   Please let me know if you have questions and which stress test you would prefer.   Sharlene Dory, PA-C

## 2022-12-14 ENCOUNTER — Encounter (HOSPITAL_COMMUNITY): Payer: Self-pay

## 2022-12-14 ENCOUNTER — Ambulatory Visit
Admission: EM | Admit: 2022-12-14 | Discharge: 2022-12-14 | Disposition: A | Discharge status: Home / Self Care | Payer: 59 | Attending: Physician Assistant | Admitting: Physician Assistant

## 2022-12-14 ENCOUNTER — Emergency Department (HOSPITAL_COMMUNITY): Payer: 59

## 2022-12-14 ENCOUNTER — Other Ambulatory Visit: Payer: Self-pay

## 2022-12-14 ENCOUNTER — Emergency Department (HOSPITAL_COMMUNITY)
Admission: EM | Admit: 2022-12-14 | Discharge: 2022-12-15 | Disposition: A | Discharge status: Home / Self Care | Payer: 59 | Source: Home / Self Care | Attending: Emergency Medicine | Admitting: Emergency Medicine

## 2022-12-14 DIAGNOSIS — R42 Dizziness and giddiness: Secondary | ICD-10-CM | POA: Insufficient documentation

## 2022-12-14 DIAGNOSIS — E119 Type 2 diabetes mellitus without complications: Secondary | ICD-10-CM | POA: Diagnosis not present

## 2022-12-14 DIAGNOSIS — J45909 Unspecified asthma, uncomplicated: Secondary | ICD-10-CM | POA: Diagnosis not present

## 2022-12-14 DIAGNOSIS — Z96652 Presence of left artificial knee joint: Secondary | ICD-10-CM | POA: Diagnosis not present

## 2022-12-14 DIAGNOSIS — Z9011 Acquired absence of right breast and nipple: Secondary | ICD-10-CM | POA: Insufficient documentation

## 2022-12-14 DIAGNOSIS — Z96642 Presence of left artificial hip joint: Secondary | ICD-10-CM | POA: Insufficient documentation

## 2022-12-14 DIAGNOSIS — Z87891 Personal history of nicotine dependence: Secondary | ICD-10-CM | POA: Insufficient documentation

## 2022-12-14 DIAGNOSIS — R0602 Shortness of breath: Secondary | ICD-10-CM | POA: Diagnosis not present

## 2022-12-14 DIAGNOSIS — R079 Chest pain, unspecified: Secondary | ICD-10-CM

## 2022-12-14 DIAGNOSIS — I110 Hypertensive heart disease with heart failure: Secondary | ICD-10-CM | POA: Insufficient documentation

## 2022-12-14 DIAGNOSIS — I509 Heart failure, unspecified: Secondary | ICD-10-CM | POA: Insufficient documentation

## 2022-12-14 DIAGNOSIS — Z85830 Personal history of malignant neoplasm of bone: Secondary | ICD-10-CM | POA: Insufficient documentation

## 2022-12-14 DIAGNOSIS — Z1152 Encounter for screening for COVID-19: Secondary | ICD-10-CM | POA: Diagnosis not present

## 2022-12-14 LAB — BASIC METABOLIC PANEL
Anion gap: 14 (ref 5–15)
BUN: 24 mg/dL — ABNORMAL HIGH (ref 8–23)
CO2: 24 mmol/L (ref 22–32)
Calcium: 9.5 mg/dL (ref 8.9–10.3)
Chloride: 103 mmol/L (ref 98–111)
Creatinine, Ser: 1.4 mg/dL — ABNORMAL HIGH (ref 0.44–1.00)
GFR, Estimated: 42 mL/min — ABNORMAL LOW (ref >60)
Glucose, Bld: 193 mg/dL — ABNORMAL HIGH (ref 70–99)
Potassium: 3.9 mmol/L (ref 3.5–5.1)
Sodium: 141 mmol/L (ref 135–145)

## 2022-12-14 LAB — RESP PANEL BY RT-PCR (RSV, FLU A&B, COVID)  RVPGX2
Influenza A by PCR: NEGATIVE
Influenza B by PCR: NEGATIVE
Resp Syncytial Virus by PCR: NEGATIVE
SARS Coronavirus 2 by RT PCR: NEGATIVE

## 2022-12-14 LAB — BRAIN NATRIURETIC PEPTIDE: B Natriuretic Peptide: 31.4 pg/mL (ref 0.0–100.0)

## 2022-12-14 LAB — CBC
HCT: 43.7 % (ref 36.0–46.0)
Hemoglobin: 13.9 g/dL (ref 12.0–15.0)
MCH: 28.7 pg (ref 26.0–34.0)
MCHC: 31.8 g/dL (ref 30.0–36.0)
MCV: 90.3 fL (ref 80.0–100.0)
Platelets: 211 10*3/uL (ref 150–400)
RBC: 4.84 MIL/uL (ref 3.87–5.11)
RDW: 13.4 % (ref 11.5–15.5)
WBC: 5.8 10*3/uL (ref 4.0–10.5)
nRBC: 0 % (ref 0.0–0.2)

## 2022-12-14 LAB — D-DIMER, QUANTITATIVE: D-Dimer, Quant: 1.15 ug{FEU}/mL — ABNORMAL HIGH (ref 0.00–0.50)

## 2022-12-14 LAB — TROPONIN I (HIGH SENSITIVITY): Troponin I (High Sensitivity): 8 ng/L (ref <18)

## 2022-12-14 MED ORDER — OXYCODONE HCL 5 MG PO TABS
5.0000 mg | ORAL_TABLET | Freq: Once | ORAL | Status: AC
  Administered 2022-12-15: 5 mg via ORAL
  Filled 2022-12-14: qty 1

## 2022-12-14 NOTE — ED Triage Notes (Signed)
"  I was at the swimming pool doing water aerobics, just after that started cramping, felt as if I was going to pass out and chest started hurting". Time of incident "0630 pm". No EMS phoned by facility. Followed closely by cardiology.   Right now "still chest pain more like pressure and a little dizzy".

## 2022-12-14 NOTE — ED Provider Notes (Signed)
[facility] Emergency Department Provider Note MRN:  [id number]  Arrival date & time: 12/15/22     Chief Complaint   Chest Pain   History of Present Illness   [name] is a 62 y.o. year-old female with a history of CHF, CAD, diabetes presenting to the ED with chief complaint of chest pain.  Right-sided chest pain with radiation to the right thoracic back and right lateral ribs.  Occurred while doing swim aerobics.  Felt lightheaded like she was going to pass out.  Also felt short of breath.  Review of Systems  A thorough review of systems was obtained and all systems are negative except as noted in the HPI and PMH.   Patient's Health History    Past Medical History:  Diagnosis Date   Allergy    Anginal pain (HCC)    a. NL cath in 2008;  b. Myoview 03/2011: dec uptake along mid anterior wall on stress imaging -> ? attenuation vs. ischemia, EF 65%;  c. 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 2008 -> coumadin x 6 mos.   Rheumatoid arthritis (HCC)    Sleep apnea    on CPAP 02/2018   SOB (shortness of breath)    Thyroid disease    TIA (transient ischemic attack)    Urinary urgency    Vitamin D deficiency     Past Surgical History:  Procedure Laterality  Date   ABDOMINAL HYSTERECTOMY  2005   APPENDECTOMY     BREAST LUMPECTOMY WITH RADIOACTIVE SEED AND SENTINEL LYMPH NODE BIOPSY Right 11/16/2021   Procedure: RIGHT BREAST RADIOACTIVE SEED LOCALIZED LUMPECTOMY WITH RIGHT AXILLARY SENTINEL LYMPH NODE BIOPSY;  Surgeon: Manus Rudd, MD;  Location: [facility];  Service: General;  Laterality: Right;  LMA & PEC BLOCK   CARDIAC CATHETERIZATION     Normal   CARDIAC CATHETERIZATION N/A 09/30/2014   Procedure: Left Heart Cath and Coronary Angiography;  Surgeon: Tonny Bollman, MD;  Location: [facility] INVASIVE CV LAB;  Service: Cardiovascular;  Laterality: N/A;   LAPAROSCOPIC APPENDECTOMY N/A 06/03/2012   Procedure: APPENDECTOMY LAPAROSCOPIC;  Surgeon: Almond Lint, MD;  Location: [facility];  Service: General;  Laterality: N/A;   Left knee surgery  2008   LEG SURGERY     TONSILLECTOMY     TOTAL HIP ARTHROPLASTY Left 06/11/2018   Procedure: LEFT TOTAL HIP ARTHROPLASTY ANTERIOR APPROACH;  Surgeon: Cammy Copa, MD;  Location: [facility] OR;  Service: Orthopedics;  Laterality: Left;   TOTAL KNEE ARTHROPLASTY Left 08/22/2016   Procedure: TOTAL KNEE ARTHROPLASTY;  Surgeon: Cammy Copa, MD;  Location: [facility];  Service: Orthopedics;  Laterality: Left;   TUBAL LIGATION  1989    Family History  Problem Relation Age of Onset   Emphysema Mother    Arthritis Mother    Heart failure Mother        alive @ 57   Stroke Mother    Diabetes Mother    Hypertension Mother    Hyperlipidemia Mother    Depression Mother    Anxiety disorder Mother    Heart disease Mother    Alcoholism Mother    Depression Father    Heart disease Father        died @ 53's.   Stroke Father    Diabetes Father    Hyperlipidemia Father    Hypertension Father    Bipolar disorder Father    Sleep apnea Father    Alcoholism Father    Drug abuse Father    Sudden death Father    Diabetes Sister    Asthma Brother    Breast cancer Maternal Aunt 81 - 41   Colon cancer Maternal  Grandfather        dx after 50   Heart disease Paternal Grandfather    Breast cancer Cousin        dx 35s   Esophageal cancer Neg Hx    Stomach cancer Neg Hx    Rectal cancer Neg Hx     Social History   Socioeconomic History   Marital status: Divorced    Spouse name: Not on file   Number of children: 3   Years of education: Not on file   Highest education level: Not on file  Occupational History   Occupation: UNEMPLOYED    Employer: DISABLED  Tobacco Use   Smoking status: Former    Current packs/day: 0.00    Average packs/day: 0.5 packs/day for 15.0 years (7.5 ttl pk-yrs)    Types: Cigarettes    Start date: 04/24/1970    Quit date: 04/24/1985    Years since quitting: 37.6   Smokeless tobacco: Never  Vaping Use   Vaping status: Never Used  Substance and Sexual Activity   Alcohol use: No    Alcohol/week: 0.0 standard drinks of alcohol   Drug use: Not Currently    Types: Marijuana   Sexual activity: Not Currently    Birth control/protection: Surgical    Comment: hyst  Other Topics Concern   Not on file  Social History Narrative   Lives in [place] by herself.  Disabled.  Occasional Tea .  3 kids, 6 grandkids.     Water aerobics 3 times a week.   Social Determinants of Health   Financial Resource Strain: High Risk (03/27/2022)   Overall Financial Resource Strain (CARDIA)    Difficulty of Paying Living Expenses: Very hard  Food Insecurity: Food Insecurity Present (03/27/2022)   Hunger Vital Sign    Worried About Running Out of Food in the Last Year: Sometimes true    Ran Out of Food in the Last Year: Sometimes true  Transportation Needs: No Transportation Needs (03/27/2022)   PRAPARE - Administrator, Civil Service (Medical): No    Lack of Transportation (Non-Medical): No  Physical Activity: Not on file  Stress: Not on file  Social Connections: Not on file  Intimate Partner Violence: Not At Risk (03/28/2022)  Humiliation, Afraid, Rape, and Kick questionnaire     Fear of Current or Ex-Partner: No    Emotionally Abused: No    Physically Abused: No    Sexually Abused: No     Physical Exam   Vitals:   12/15/22 0145 12/15/22 0215  BP: 131/69 118/67  Pulse: 72 77  Resp: 18   Temp:    SpO2: 96% 96%    CONSTITUTIONAL: Well-appearing, NAD NEURO/PSYCH:  Alert and oriented x 3, no focal deficits EYES:  eyes equal and reactive ENT/NECK:  no LAD, no JVD CARDIO: Regular rate, well-perfused, normal S1 and S2 PULM:  CTAB no wheezing or rhonchi GI/GU:  non-distended, non-tender MSK/SPINE:  No gross deformities, no edema SKIN:  no rash, atraumatic   *Additional and/or pertinent findings included in MDM below  Diagnostic and Interventional Summary    EKG Interpretation Date/Time:  Thursday December 14 2022 20:38:33 EDT Ventricular Rate:  75 PR Interval:  194 QRS Duration:  106 QT Interval:  374 QTC Calculation: 417 R Axis:   51  Text Interpretation: Normal sinus rhythm Nonspecific ST and T wave abnormality Abnormal ECG When compared with ECG of 14-Dec-2022 19:36, PREVIOUS ECG IS PRESENT Confirmed by Kennis Carina [phone]) on 12/14/2022 11:31:46 PM       Labs Reviewed  BASIC METABOLIC PANEL - Abnormal; Notable for the following components:      Result Value   Glucose, Bld 193 (*)    BUN 24 (*)    Creatinine, Ser 1.40 (*)    GFR, Estimated 42 (*)    All other components within normal limits  D-DIMER, QUANTITATIVE - Abnormal; Notable for the following components:   D-Dimer, Quant 1.15 (*)    All other components within normal limits  RESP PANEL BY RT-PCR (RSV, FLU A&B, COVID)  [id number]  CBC  BRAIN NATRIURETIC PEPTIDE  TROPONIN I (HIGH SENSITIVITY)  TROPONIN I (HIGH SENSITIVITY)    CT Angio Chest Pulmonary Embolism (PE) W or WO Contrast  Final Result    DG Chest 2 View  Final Result      Medications  oxyCODONE (Oxy IR/ROXICODONE) immediate release tablet 5 mg (5 mg Oral Given 12/15/22 0107)  iohexol (OMNIPAQUE) 350 MG/ML injection  75 mL (75 mLs Intravenous Contrast Given 12/15/22 0238)     Procedures  /  Critical Care Procedures  ED Course and Medical Decision Making  Initial Impression and Ddx Patient is resting comfortably in no acute distress with normal vital signs.  She seems to have pain elicited with movement, her right lateral ribs are tender.  Seems mostly MSK however patient has multiple comorbidities including CAD, there is documentation of some type of clotting disorder but it is unclear what kind.  In triage labs were obtained and troponin is negative however D-dimer is elevated.  Will need second troponin to more thoroughly evaluate for ACS and CTA to exclude PE.  Dissection also considered, would pay particular attention to the aorta on the CT chest.  Past medical/surgical history that increases complexity of ED encounter: Hypertension, diabetes, CHF  Interpretation of Diagnostics I personally reviewed the EKG and my interpretation is as follows: Sinus rhythm with nonspecific ST and T wave findings similar to prior  Labs reassuring with no significant blood count or electrolyte disturbance.  D-dimer positive.  Troponin negative x 2.  CTA is normal.  Patient Reassessment and Ultimate Disposition/Management     Patient continues to sleep comfortably with normal vital signs, no emergent process, overall low concern for  cardiac etiology.  Appropriate for discharge.  Patient management required discussion with the following services or consulting groups:  None  Complexity of Problems Addressed Acute illness or injury that poses threat of life of bodily function  Additional Data Reviewed and Analyzed Further history obtained from: Prior labs/imaging results  Additional Factors Impacting ED Encounter Risk None  Elmer Sow. Pilar Plate, MD [facility] Emergency Medicine [facility] [email or web address]  Final Clinical Impressions(s) / ED Diagnoses     ICD-10-CM   1. Chest pain,  unspecified type  R07.9       ED Discharge Orders     None        Discharge Instructions Discussed with and Provided to Patient:     Discharge Instructions      You were evaluated in the Emergency Department and after careful evaluation, we did not find any emergent condition requiring admission or further testing in the hospital.  Your exam/testing today was overall reassuring.  No signs of blood clots or heart damage or heart attack.  Recommend follow-up with your cardiologist to discuss your symptoms.  Pain may be more muscular, can use Tylenol at home for discomfort.  Please return to the Emergency Department if you experience any worsening of your condition.  Thank you for allowing [name] to be a part of your care.        Sabas Sous, MD 12/15/22 [phone]

## 2022-12-14 NOTE — ED Provider Notes (Signed)
[name]    CSN: [id number] Arrival date & time: 12/14/22  1914      History   Chief Complaint Chief Complaint  Patient presents with   Chest Pain    HPI TITANNA AUGSPURGER is a 62 y.o. female.   Patient here today for evaluation of chest pain that started while she was doing water aerobics about an hour before arrival at [facility]. She reports that she started to have cramping in her legs that moved upward and then felt as if she would pass out. She does have significant past medical cardiac history and cardiologist recommended stress test only 2 days ago.   The history is provided by the patient.  Chest Pain Associated symptoms: dizziness   Associated symptoms: no abdominal pain, no fever, no nausea, no shortness of breath and no vomiting     Past Medical History:  Diagnosis Date   Allergy    Anginal pain (HCC)    a. NL cath in 2008;  b. Myoview 03/2011: dec uptake along mid anterior wall on stress imaging -> ? attenuation vs. ischemia, EF 65%;  c. 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 2008 -> coumadin x 6 mos.   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   Back pain 06/01/2021   Right thyroid nodule 05/31/2021   At risk for impaired metabolic function 04/12/2020   Unilateral primary osteoarthritis, left hip    Hip arthritis 06/11/2018   Class 3 severe obesity with serious comorbidity and body mass index (BMI) of 40.0 to 44.9 in adult [facility]) 05/29/2018   Other fatigue 11/20/2017   Shortness of breath on exertion 11/20/2017   Type 2 diabetes mellitus with hyperglycemia, without long-term current use of insulin (HCC) 11/20/2017   Vitamin D deficiency 11/20/2017   Depression with anxiety 11/20/2017   Other hyperlipidemia  11/20/2017   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/04/2016   Presence of left artificial knee joint 09/20/2016   Arthritis of knee 08/22/2016   Cervical radiculopathy 07/26/2016   Cervical disc disorder with radiculopathy 07/26/2016   Right arm pain 06/29/2016   Cervicalgia 06/29/2016   Primary osteoarthritis of left knee 06/29/2016   Hypersomnia with sleep apnea 11/18/2015   Lethargy 11/18/2015   Exertional chest pain 09/30/2014   Abnormal cardiac function test 09/29/2014   Chest pain with moderate risk for cardiac etiology 09/28/2014   HTN (hypertension) 09/28/2014   AKI (acute kidney injury) (HCC)    Paresthesia 06/18/2014   Numbness and tingling of left arm and leg    Unstable angina (HCC) 05/24/2014   OSA on CPAP 05/24/2014   CAD (coronary artery disease) 11/25/2012   S/P laparoscopic appendectomy 06/04/2012   Chronic diastolic congestive heart failure (HCC) 01/09/2012   Mediastinal mass 01/09/2012   SOB  (shortness of breath) 06/20/2011   Mediastinal abnormality 06/20/2011   Dyslipidemia 12/23/2009   GLAUCOMA 12/23/2009   ARTHRITIS 12/23/2009   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   Class 2 severe obesity with serious comorbidity and body mass index (BMI) of 38.0 to 38.9 in adult (HCC) 09/13/2006   ANXIETY STATE NOS 09/13/2006   DISORDER, DEPRESSIVE NEC 09/13/2006   CARPAL TUNNEL SYNDROME, MILD 09/13/2006   Essential hypertension 09/13/2006   IBS 09/13/2006   DEGENERATION, LUMBAR/LUMBOSACRAL DISC 09/13/2006   SYMPTOM, SWELLING/MASS/LUMP IN CHEST 09/13/2006   PULMONARY EMBOLISM, HX OF 09/13/2006    Past Surgical History:  Procedure Laterality Date   ABDOMINAL HYSTERECTOMY  2005   APPENDECTOMY     BREAST LUMPECTOMY WITH RADIOACTIVE SEED AND SENTINEL LYMPH NODE BIOPSY Right 11/16/2021   Procedure: RIGHT BREAST RADIOACTIVE SEED LOCALIZED LUMPECTOMY WITH RIGHT AXILLARY SENTINEL LYMPH NODE BIOPSY;  Surgeon: Manus Rudd, MD;  Location: [facility];  Service: General;  Laterality: Right;  LMA & PEC BLOCK   CARDIAC CATHETERIZATION     Normal   CARDIAC CATHETERIZATION N/A 09/30/2014   Procedure: Left Heart Cath and Coronary Angiography;  Surgeon: Tonny Bollman, MD;  Location: [facility] INVASIVE CV LAB;  Service: Cardiovascular;  Laterality: N/A;   LAPAROSCOPIC APPENDECTOMY N/A 06/03/2012   Procedure: APPENDECTOMY LAPAROSCOPIC;  Surgeon: Almond Lint, MD;  Location: [facility] OR;  Service: General;  Laterality: N/A;   Left knee surgery  2008   LEG SURGERY     TONSILLECTOMY     TOTAL HIP ARTHROPLASTY Left 06/11/2018   Procedure: LEFT TOTAL HIP ARTHROPLASTY ANTERIOR APPROACH;  Surgeon: Cammy Copa, MD;  Location: [facility] OR;  Service: Orthopedics;  Laterality: Left;   TOTAL KNEE ARTHROPLASTY Left 08/22/2016   Procedure: TOTAL KNEE ARTHROPLASTY;  Surgeon: Cammy Copa, MD;  Location: [facility];  Service: Orthopedics;  Laterality: Left;   TUBAL LIGATION  1989    OB History     Gravida  4   Para   3   Term  3   Preterm  0   AB  1   Living  3      SAB  1   IAB  0   Ectopic  0   Multiple  0   Live Births  3            Home Medications    Prior to Admission medications   Medication Sig Start Date End Date Taking? Authorizing Provider  ACCU-CHEK GUIDE test strip  10/28/21   [provider]  acetaminophen (TYLENOL) 500 MG tablet Take 1,000 mg  by mouth 2 (two) times daily as needed for moderate pain.    [provider]  albuterol (PROVENTIL HFA;VENTOLIN HFA) 108 (90 Base) MCG/ACT inhaler Inhale 2 puffs into the lungs every 6 (six) hours as needed for wheezing or shortness of breath.    [provider]  aspirin 81 MG chewable tablet Chew 81 mg by mouth daily.    [provider]  atorvastatin (LIPITOR) 40 MG tablet Take 40 mg by mouth at bedtime.     [provider]  busPIRone (BUSPAR) 30 MG tablet Take 30 mg by mouth 2 (two) times daily. 12/18/19   [provider]  carvedilol (COREG) 6.25 MG tablet Take 1 tablet (6.25 mg total) by mouth 2 (two) times daily. 09/06/21   Wendall Stade, MD  diclofenac Sodium (VOLTAREN) 1 % GEL Apply 2 g topically 4 (four) times daily as needed (joint/muscle pain). 09/20/19   [provider]  empagliflozin (JARDIANCE) 10 MG TABS tablet Take 1 tablet (10 mg total) by mouth daily. 03/28/22   Arrien, York Ram, MD  EPINEPHrine 0.3 mg/0.3 mL IJ SOAJ injection Inject 0.3 mg into the muscle as needed for anaphylaxis. Follow package instructions as needed for severe allergy or anaphylactic reaction. 11/06/21   Sharman Cheek, MD  furosemide (LASIX) 40 MG tablet Take 1 tablet (40 mg total) by mouth daily as needed for edema or fluid (take in case of weight gain 2 to 3 lbs in 24 hrs or 5 lbs in 7 days.). 03/28/22 11/07/22  Arrien, York Ram, MD  hydrOXYzine (VISTARIL) 25 MG capsule Take 25 mg by mouth 3 (three) times daily as needed for itching.    [provider]   lidocaine (LIDODERM) 5 % Place 1 patch onto the skin daily as needed for pain. 02/11/21   [provider]  lidocaine-prilocaine (EMLA) cream Apply to affected area once Patient taking differently: Apply 1 Application topically once. Apply to affected area once 12/20/21   Rachel Moulds, MD  loperamide (IMODIUM) 2 MG capsule Take 1 capsule (2 mg total) by mouth 4 (four) times daily as needed for diarrhea or loose stools. 01/07/22   Long, Arlyss Repress, MD  losartan (COZAAR) 25 MG tablet Take 1 tablet (25 mg total) by mouth daily. 03/28/22 11/07/22  Arrien, York Ram, MD  metFORMIN (GLUCOPHAGE) 500 MG tablet Take 1 tablet (500 mg total) by mouth daily with breakfast. 11/06/22   Danford, Orpha Bur D, NP  methocarbamol (ROBAXIN) 500 MG tablet as needed for muscle spasms. 10/27/22   [provider]  nitroGLYCERIN (NITROSTAT) 0.4 MG SL tablet Place 1 tablet (0.4 mg total) under the tongue every 5 (five) minutes as needed for chest pain. 04/11/22   Sharlene Dory, PA-C  nystatin (MYCOSTATIN/NYSTOP) powder Apply 1 application topically 3 (three) times daily as needed for rash.    [provider]  ondansetron (ZOFRAN) 8 MG tablet as needed for nausea or vomiting.    [provider]  ondansetron (ZOFRAN-ODT) 4 MG disintegrating tablet Take 1 tablet (4 mg total) by mouth every 8 (eight) hours as needed for nausea or vomiting. 10/15/22   Chestine Spore, Meghan R, PA-C  oxyCODONE (OXY IR/ROXICODONE) 5 MG immediate release tablet Take 1 tablet by mouth every 6 (six) hours as needed.    [provider]  pantoprazole (PROTONIX) 40 MG tablet Take 1 tablet (40 mg total) by mouth 2 (two) times daily. Patient taking differently: Take 40 mg by mouth daily. 03/09/21   Zannie Cove, MD  prochlorperazine (COMPAZINE) 10 MG tablet Take 1 tablet (10 mg total) by mouth every 6 (six) hours as needed for nausea or vomiting. 12/20/21   Rachel Moulds, MD  sertraline (ZOLOFT) 25 MG tablet Take 1 tablet  (25 mg total) by mouth daily. 09/04/22   Langston Reusing, MD  spironolactone (ALDACTONE) 25 MG tablet Take 1/2 tablet (12.5 mg total) by mouth daily. Patient taking differently: Take 25 mg by mouth every other day. 03/28/22 11/07/22  Arrien, York Ram, MD  Vitamin D, Ergocalciferol, (DRISDOL) 1.25 MG (50000 UNIT) CAPS capsule Take 1 capsule (50,000 Units total) by mouth every 7 (seven) days. 11/06/22   Danford, Jinny Blossom, NP    Family History Family History  Problem Relation Age of Onset   Emphysema Mother    Arthritis Mother    Heart failure Mother        alive @ 1   Stroke Mother    Diabetes Mother    Hypertension Mother    Hyperlipidemia Mother    Depression Mother    Anxiety disorder Mother    Heart disease Mother    Alcoholism Mother    Depression Father    Heart disease Father        died @ 59's.   Stroke Father    Diabetes Father    Hyperlipidemia Father    Hypertension Father    Bipolar disorder Father    Sleep apnea Father    Alcoholism Father    Drug abuse Father    Sudden death Father    Diabetes Sister    Asthma Brother    Breast cancer Maternal Aunt 30 - 24   Colon cancer Maternal Grandfather        dx after 50   Heart disease Paternal Grandfather    Breast cancer Cousin        dx 37s   Esophageal cancer Neg Hx    Stomach cancer Neg Hx    Rectal cancer Neg Hx     Social History Social History   Tobacco Use   Smoking status: Former    Current packs/day: 0.00    Average packs/day: 0.5 packs/day for 15.0 years (7.5 ttl pk-yrs)    Types: Cigarettes    Start date: 04/24/1970    Quit date: 04/24/1985    Years since quitting: 37.6   Smokeless tobacco: Never  Vaping Use   Vaping status: Never Used  Substance Use Topics   Alcohol use: No    Alcohol/week: 0.0 standard drinks of alcohol   Drug use: Not Currently    Types: Marijuana     Allergies   Shellfish allergy, Hydrocodone-acetaminophen, Penicillins, Gabapentin, Latex, and Sulfa  antibiotics   Review of Systems Review of Systems  Constitutional:  Negative for chills and fever.  Eyes:  Negative for discharge and redness.  Respiratory:  Negative for shortness of breath.   Cardiovascular:  Positive for chest pain.  Gastrointestinal:  Negative for abdominal pain, nausea and vomiting.  Genitourinary:  Positive for vaginal bleeding and vaginal discharge.  Neurological:  Positive for dizziness and light-headedness.     Physical Exam Triage Vital Signs ED Triage Vitals  Encounter Vitals Group     BP 12/14/22 1927 118/79     Systolic BP Percentile --      Diastolic BP Percentile --      Pulse Rate 12/14/22 1927 79     Resp 12/14/22 1927 18     Temp 12/14/22 1927 98.6 F (37 C)  Temp Source 12/14/22 1927 Oral     SpO2 12/14/22 1927 98 %     Weight 12/14/22 1926 206 lb (93.4 kg)     Height 12/14/22 1926 4\' 10"  (1.473 m)     Head Circumference --      Peak Flow --      Pain Score 12/14/22 1921 8     Pain Loc --      Pain Education --      Exclude from Growth Chart --    No data found.  Updated Vital Signs BP 118/79 (BP Location: Left Arm)   Pulse 79   Temp 98.6 F (37 C) (Oral)   Resp 18   Ht 4\' 10"  (1.473 m)   Wt 206 lb (93.4 kg)   SpO2 98%   BMI 43.05 kg/m     Physical Exam Vitals and nursing note reviewed.  Constitutional:      General: She is not in acute distress.    Appearance: Normal appearance. She is not ill-appearing.  HENT:     Head: Normocephalic and atraumatic.  Eyes:     Conjunctiva/sclera: Conjunctivae normal.  Cardiovascular:     Rate and Rhythm: Normal rate.  Pulmonary:     Effort: Pulmonary effort is normal. No respiratory distress.  Neurological:     Mental Status: She is alert.  Psychiatric:        Mood and Affect: Mood normal.        Behavior: Behavior normal.        Thought Content: Thought content normal.      UC Treatments / Results  Labs (all labs ordered are listed, but only abnormal results are  displayed) Labs Reviewed - No data to display  EKG   Radiology No results found.  Procedures Procedures (including critical care time)  Medications Ordered in UC Medications - No data to display  Initial Impression / Assessment and Plan / UC Course  I have reviewed the triage vital signs and the nursing notes.  Pertinent labs & imaging results that were available during my care of the patient were reviewed by me and considered in my medical decision making (see chart for details).     Given complaints, cardiac history recommended EMS transport to ED. Patient is agreeable. EKG with changes- inverted T waves leads III, v4, v5.    Final Clinical Impressions(s) / UC Diagnoses   Final diagnoses:  Chest pain, unspecified type   Discharge Instructions   None    ED Prescriptions   None    PDMP not reviewed this encounter.   Tomi Bamberger, PA-C 12/14/22 [phone]

## 2022-12-14 NOTE — ED Provider Triage Note (Signed)
Emergency Medicine Provider Triage Evaluation Note  [name] , a 62 y.o. female  was evaluated in triage.  Pt complains of chest pain, shortness of breath that started about 6:30 PM today while doing a water aerobic class.  Pain is on the right side of her chest radiated to under her armpit and to her right upper back.  History of PE, not on any blood thinner.  Endorses cough and runny nose.  Denies fever.  Denies leg swelling.  Review of Systems  Positive: As above Negative: As above  Physical Exam  BP (!) 140/93 (BP Location: Left Arm)   Pulse 79   Temp 98.6 F (37 C) (Oral)   Resp 17   Ht 4\' 10"  (1.473 m)   Wt 93.4 kg   SpO2 98%   BMI 43.05 kg/m  Gen:   Awake, no distress   Resp:  Normal effort  MSK:   Moves extremities without difficulty  Other:    Medical Decision Making  Medically screening exam initiated at 8:43 PM.  Appropriate orders placed.  [name] was informed that the remainder of the evaluation will be completed by another provider, this initial triage assessment does not replace that evaluation, and the importance of remaining in the ED until their evaluation is complete.    Jeanelle Malling, [place] 12/14/22 2044

## 2022-12-14 NOTE — ED Triage Notes (Signed)
Patient arrives from [facility] urgent care. Patient reports chest pain that started at 1830 today while doing water aerobics. Patient has been seen here for angina previously. Patient has been referred to cardiologist but has not taken a stress test yet. Reports central chest pain that radiates tot he right side of her back.

## 2022-12-14 NOTE — ED Notes (Signed)
Patient is being discharged from the Urgent Care and sent to the Emergency Department via EMS (Called by Provider at [phone]) . Per Provider, patient is in need of higher level of care due to Chest Pain (Cardiac Evaluation). Patient is aware and verbalizes understanding of plan of care.  Vitals:   12/14/22 1927  BP: 118/79  Pulse: 79  Resp: 18  Temp: 98.6 F (37 C)  SpO2: 98%

## 2022-12-15 ENCOUNTER — Emergency Department (HOSPITAL_COMMUNITY): Payer: 59

## 2022-12-15 DIAGNOSIS — R079 Chest pain, unspecified: Secondary | ICD-10-CM | POA: Diagnosis not present

## 2022-12-15 LAB — TROPONIN I (HIGH SENSITIVITY): Troponin I (High Sensitivity): 10 ng/L (ref <18)

## 2022-12-15 MED ORDER — IOHEXOL 350 MG/ML SOLN
75.0000 mL | Freq: Once | INTRAVENOUS | Status: AC | PRN
  Administered 2022-12-15: 75 mL via INTRAVENOUS

## 2022-12-15 NOTE — Discharge Instructions (Signed)
You were evaluated in the Emergency Department and after careful evaluation, we did not find any emergent condition requiring admission or further testing in the hospital.  Your exam/testing today was overall reassuring.  No signs of blood clots or heart damage or heart attack.  Recommend follow-up with your cardiologist to discuss your symptoms.  Pain may be more muscular, can use Tylenol at home for discomfort.  Please return to the Emergency Department if you experience any worsening of your condition.  Thank you for allowing [name] to be a part of your care.

## 2022-12-18 NOTE — Progress Notes (Signed)
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 She was seated and requested her BP be checked. Thought it might be low. She says she is sweating.  Pulse 78 BP 122/62 BS 219 Ate breakfast and took meds. ? Maybe hitting all at the same time?  Does express under lots of stress. Encouraged pt to take advantage of supportive services as well as eating regularly and take meds as prescribed.  Sts will go  home to rest.

## 2022-12-20 NOTE — Progress Notes (Signed)
[employer] PREP Weekly Session  Patient Details  Name: [name] MRN: [id number] Date of Birth: 1960/10/20 Age: 62 y.o. PCP: Raymon Mutton., FNP  Vitals:   12/18/22 1030  Weight: 208 lb (94.3 kg)     [employer] Weekly seesion - 12/20/22 1500       [employer] "PREP" Location   [employer] [facility]" [facility] [employer]      Weekly Session   Topic Discussed Restaurant Eating   Salt and sugar demos   Minutes exercised this week 180 minutes    Classes attended to date 8             Pam Jerral Bonito 12/20/2022, 3:15 PM

## 2022-12-27 ENCOUNTER — Ambulatory Visit: Payer: 59 | Attending: Physician Assistant

## 2022-12-27 ENCOUNTER — Other Ambulatory Visit: Payer: Self-pay | Admitting: Physician Assistant

## 2022-12-27 DIAGNOSIS — I2089 Other forms of angina pectoris: Secondary | ICD-10-CM

## 2022-12-27 DIAGNOSIS — R42 Dizziness and giddiness: Secondary | ICD-10-CM

## 2022-12-27 DIAGNOSIS — R079 Chest pain, unspecified: Secondary | ICD-10-CM

## 2022-12-27 LAB — EXERCISE TOLERANCE TEST
Angina Index: 2
Duke Treadmill Score: -5
Estimated workload: 4.8
Exercise duration (min): 3 min
Exercise duration (sec): 14 s
MPHR: 157 {beats}/min
Peak HR: 108 {beats}/min
Percent HR: 68 %
RPE: 15
Rest HR: 67 {beats}/min
ST Depression (mm): 0 mm

## 2022-12-28 ENCOUNTER — Encounter: Payer: Self-pay | Admitting: *Deleted

## 2022-12-28 ENCOUNTER — Telehealth (INDEPENDENT_AMBULATORY_CARE_PROVIDER_SITE_OTHER): Payer: Self-pay | Admitting: Adult Health

## 2022-12-28 NOTE — Telephone Encounter (Signed)
Patient called in requesting a refill on her Vitamin D3 to go to [facility] and her Metformin 500 mg to go to [facility] on [address].

## 2023-01-01 ENCOUNTER — Telehealth: Payer: Self-pay

## 2023-01-01 NOTE — Telephone Encounter (Signed)
Received text message from pt requesting call back.  Called patient this am. Reports had stress test on Thursday and needs to have a heart cath on 01/10/23. Sts providers told her to take it easy and not to work out on equipment. Pt asked what could she do. I asked what the Providers told her she could do? Asked her to ask them if she could take walks?  Will pause PREP for now and pt will contact me when she is ready again for PREP

## 2023-01-02 ENCOUNTER — Other Ambulatory Visit: Payer: Self-pay | Admitting: *Deleted

## 2023-01-02 DIAGNOSIS — Z01812 Encounter for preprocedural laboratory examination: Secondary | ICD-10-CM

## 2023-01-02 DIAGNOSIS — R9439 Abnormal result of other cardiovascular function study: Secondary | ICD-10-CM

## 2023-01-03 ENCOUNTER — Telehealth (INDEPENDENT_AMBULATORY_CARE_PROVIDER_SITE_OTHER): Payer: Self-pay | Admitting: Internal Medicine

## 2023-01-03 NOTE — Telephone Encounter (Signed)
Spoke to pt, will do lab  for Vti D and discuss refill

## 2023-01-03 NOTE — Telephone Encounter (Signed)
Please advise. Thanks.  

## 2023-01-04 ENCOUNTER — Ambulatory Visit: Payer: 59 | Admitting: Internal Medicine

## 2023-01-04 ENCOUNTER — Encounter: Payer: Self-pay | Admitting: Internal Medicine

## 2023-01-04 ENCOUNTER — Ambulatory Visit: Payer: 59 | Attending: Internal Medicine | Admitting: Internal Medicine

## 2023-01-04 VITALS — BP 110/72 | HR 77 | Ht <= 58 in | Wt 209.9 lb

## 2023-01-04 DIAGNOSIS — Z01812 Encounter for preprocedural laboratory examination: Secondary | ICD-10-CM | POA: Diagnosis not present

## 2023-01-04 DIAGNOSIS — R9439 Abnormal result of other cardiovascular function study: Secondary | ICD-10-CM | POA: Diagnosis not present

## 2023-01-04 DIAGNOSIS — I2510 Atherosclerotic heart disease of native coronary artery without angina pectoris: Secondary | ICD-10-CM

## 2023-01-04 NOTE — Progress Notes (Signed)
  Cardiology Office Note:  .   Date:  01/04/2023  ID:  EDOM MUNOS, DOB Jul 23, 1960, MRN [id number] PCP: Raymon Mutton., FNP  [facility] Providers Cardiologist:  Charlton Haws, MD    History of Present Illness: .   [name] is a 62 y.o. female ignificant for breast cancer (newly diagnosed), bone cancer, HFpEF, OSA, remote history of PE no longer on OAC, TAA, HTN, DM2 presenting for an acute visit to have an H&P prior to cath  See Tessa Conte's prior note 11/07/2022. Briefly, she has hx of HFpEF and HTN. She was describing angina and underwent an ETT. She exercised for only 4.8 METS, did not reach [facility] 2/2 exercise-limiting angina during the test as well as presyncope-patient states, "feel like I am going to pass out-room around getting dark", chest fullness. Her stress ECG nondiagnostic due to baseline ST abnormalities.   Recent echo 12/01/2022 showed EF 50-55%, GLS -18, RV fxn.  She had a LHC in 2016, which was femoral. She had no obstructive lesions.   Today, she notes CP that comes and goes. It's brief; she notes it with activity still.  Otherwise no CHF symptoms  She has breast cancer. She had R breast lumpectomy, s/p XRT. She is s/p 4 cycles of TC. No AC or anti-HER2  BP is good today  ROS:  per HPI otherwise negative   Studies Reviewed: [name]        TTE per above and ETT. ETT shows some worsening down sloping ST changes in the inferior leads that improve with recovery  EKG 12/15/2022- sinus with non specific ST-T changes  Risk Assessment/Calculations:        Physical Exam:   VS:  Vitals:   01/04/23 0941  BP: 110/72  Pulse: 77  SpO2: 96%    Wt Readings from Last 3 Encounters:  12/18/22 208 lb (94.3 kg)  12/14/22 206 lb (93.4 kg)  12/14/22 206 lb (93.4 kg)    GEN: Well nourished, well developed in no acute distress NECK: No JVD; No carotid bruits CARDIAC: RRR, no murmurs, rubs, gallops RESPIRATORY:  Clear to auscultation without rales,  wheezing or rhonchi  Vasc: good radial pulses BL as well as DP ABDOMEN: Soft, non-tender, non-distended EXTREMITIES:  No edema; No deformity   ASSESSMENT AND PLAN: .   Angina - she reports intermittent angina and her stress test was non diagnostic with low METS. She had symptoms that limited the study.; therefore a cath was recommended -LHC is already scheduled and she has FU   Cardio-Onc - no significant cardiotoxic chemo    Informed Consent   Shared Decision Making/Informed Consent The risks [stroke (1 in 1000), death (1 in 1000), kidney failure [usually temporary] (1 in 500), bleeding (1 in 200), allergic reaction [possibly serious] (1 in 200)], benefits (diagnostic support and management of coronary artery disease) and alternatives of a cardiac catheterization were discussed in detail with Ms. Hedgepeth and she is willing to proceed.       Signed, Maisie Fus, MD

## 2023-01-04 NOTE — H&P (View-Only) (Signed)
Cardiology Office Note:  .   Date:  01/04/2023  ID:  TARAH STALVEY, DOB [birth year], MRN [id number] PCP: Raymon Mutton., FNP  [facility] Providers Cardiologist:  Charlton Haws, MD    History of Present Illness: .   [name] is a 62 y.o. female ignificant for breast cancer (newly diagnosed), bone cancer, HFpEF, OSA, remote history of PE no longer on OAC, TAA, HTN, DM2 presenting for an acute visit to have an H&P prior to cath  See Tessa Conte's prior note 11/07/2022. Briefly, she has hx of HFpEF and HTN. She was describing angina and underwent an ETT. She exercised for only 4.8 METS, did not reach [facility] 2/2 exercise-limiting angina during the test as well as presyncope-patient states, "feel like I am going to pass out-room around getting dark", chest fullness. Her stress ECG nondiagnostic due to baseline ST abnormalities.   Recent echo 12/01/2022 showed EF 50-55%, GLS -18, RV fxn.  She had a LHC in 2016, which was femoral. She had no obstructive lesions.   Today, she notes CP that comes and goes. It's brief; she notes it with activity still.  Otherwise no CHF symptoms  She has breast cancer. She had R breast lumpectomy, s/p XRT. She is s/p 4 cycles of TC. No AC or anti-HER2  BP is good today  ROS:  per HPI otherwise negative   Studies Reviewed: [name]        TTE per above and ETT. ETT shows some worsening down sloping ST changes in the inferior leads that improve with recovery  EKG 12/15/2022- sinus with non specific ST-T changes  Risk Assessment/Calculations:        Physical Exam:   VS:  Vitals:   01/04/23 0941  BP: 110/72  Pulse: 77  SpO2: 96%    Wt Readings from Last 3 Encounters:  12/18/22 208 lb (94.3 kg)  12/14/22 206 lb (93.4 kg)  12/14/22 206 lb (93.4 kg)    GEN: Well nourished, well developed in no acute distress NECK: No JVD; No carotid bruits CARDIAC: RRR, no murmurs, rubs, gallops RESPIRATORY:  Clear to auscultation without rales,  wheezing or rhonchi  Vasc: good radial pulses BL as well as DP ABDOMEN: Soft, non-tender, non-distended EXTREMITIES:  No edema; No deformity   ASSESSMENT AND PLAN: .   Angina - she reports intermittent angina and her stress test was non diagnostic with low METS. She had symptoms that limited the study.; therefore a cath was recommended -LHC is already scheduled and she has FU   Cardio-Onc - no significant cardiotoxic chemo    Informed Consent   Shared Decision Making/Informed Consent The risks [stroke (1 in 1000), death (1 in 1000), kidney failure [usually temporary] (1 in 500), bleeding (1 in 200), allergic reaction [possibly serious] (1 in 200)], benefits (diagnostic support and management of coronary artery disease) and alternatives of a cardiac catheterization were discussed in detail with Ms. Cogswell and she is willing to proceed.       Signed, Maisie Fus, MD

## 2023-01-04 NOTE — Patient Instructions (Addendum)
Medication Instructions:   Your physician recommends that you continue on your current medications as directed. Please refer to the Current Medication list given to you today.  *If you need a refill on your cardiac medications before your next appointment, please call your pharmacy*   Lab Work:  TODAY--BMET AND CBC W DIFF  If you have labs (blood work) drawn today and your tests are completely normal, you will receive your results only by: MyChart Message (if you have MyChart) OR A paper copy in the mail If you have any lab test that is abnormal or we need to change your treatment, we will call you to review the results.     Follow-Up:  WITH TESSA CONTE PA-C AS SCHEDULED ON 02/13/23 AT 11:20 AM

## 2023-01-05 ENCOUNTER — Encounter: Payer: Self-pay | Admitting: Hematology and Oncology

## 2023-01-05 LAB — CBC WITH DIFFERENTIAL/PLATELET
Basophils Absolute: 0 10*3/uL (ref 0.0–0.2)
Basos: 1 %
EOS (ABSOLUTE): 0.1 10*3/uL (ref 0.0–0.4)
Eos: 1 %
Hematocrit: 42.7 % (ref 34.0–46.6)
Hemoglobin: 13.7 g/dL (ref 11.1–15.9)
Immature Grans (Abs): 0 10*3/uL (ref 0.0–0.1)
Immature Granulocytes: 0 %
Lymphocytes Absolute: 1.3 10*3/uL (ref 0.7–3.1)
Lymphs: 29 %
MCH: 28.7 pg (ref 26.6–33.0)
MCHC: 32.1 g/dL (ref 31.5–35.7)
MCV: 89 fL (ref 79–97)
Monocytes Absolute: 0.4 10*3/uL (ref 0.1–0.9)
Monocytes: 8 %
Neutrophils Absolute: 2.8 10*3/uL (ref 1.4–7.0)
Neutrophils: 61 %
Platelets: 189 10*3/uL (ref 150–450)
RBC: 4.78 x10E6/uL (ref 3.77–5.28)
RDW: 13.1 % (ref 11.7–15.4)
WBC: 4.6 10*3/uL (ref 3.4–10.8)

## 2023-01-05 LAB — BASIC METABOLIC PANEL
BUN/Creatinine Ratio: 21 (ref 12–28)
BUN: 20 mg/dL (ref 8–27)
CO2: 25 mmol/L (ref 20–29)
Calcium: 10 mg/dL (ref 8.7–10.3)
Chloride: 106 mmol/L (ref 96–106)
Creatinine, Ser: 0.97 mg/dL (ref 0.57–1.00)
Glucose: 172 mg/dL — ABNORMAL HIGH (ref 70–99)
Potassium: 4.7 mmol/L (ref 3.5–5.2)
Sodium: 144 mmol/L (ref 134–144)
eGFR: 66 mL/min/{1.73_m2} (ref >59)

## 2023-01-09 ENCOUNTER — Telehealth: Payer: Self-pay | Admitting: *Deleted

## 2023-01-09 NOTE — Telephone Encounter (Signed)
Cardiac Catheterization scheduled at [facility] for: Wednesday January 10, 2023 10 AM Arrival time [facility] Main Entrance A at: 8 AM  Nothing to eat after midnight prior to procedure, clear liquids until 5 AM day of procedure.  CONTRAST ALLERGY: no. Pt reports as far as she knows, she is not allergic to IV contrast.  Medication instructions: -Hold:  Metformin-day of procedure and 48 hours post procedure   Jardiance/Lasix/Spironolactone/KCl-AM of procedure -Other usual morning medications can be taken with sips of water including aspirin 81 mg.  Plan to go home the same day, you will only stay overnight if medically necessary.  You must have responsible adult to drive you home.  Someone must be with you the first 24 hours after you arrive home.  Reviewed procedure instructions with patient.

## 2023-01-10 ENCOUNTER — Other Ambulatory Visit: Payer: Self-pay

## 2023-01-10 ENCOUNTER — Ambulatory Visit (HOSPITAL_COMMUNITY)
Admission: RE | Admit: 2023-01-10 | Discharge: 2023-01-10 | Disposition: A | Discharge status: Home / Self Care | Payer: 59 | Attending: Internal Medicine | Admitting: Internal Medicine

## 2023-01-10 ENCOUNTER — Encounter (HOSPITAL_COMMUNITY)
Admission: RE | Disposition: A | Discharge status: Home / Self Care | Payer: Self-pay | Source: Home / Self Care | Attending: Internal Medicine

## 2023-01-10 DIAGNOSIS — I420 Dilated cardiomyopathy: Secondary | ICD-10-CM | POA: Diagnosis present

## 2023-01-10 DIAGNOSIS — Z86711 Personal history of pulmonary embolism: Secondary | ICD-10-CM | POA: Insufficient documentation

## 2023-01-10 DIAGNOSIS — C50919 Malignant neoplasm of unspecified site of unspecified female breast: Secondary | ICD-10-CM | POA: Diagnosis not present

## 2023-01-10 DIAGNOSIS — I5030 Unspecified diastolic (congestive) heart failure: Secondary | ICD-10-CM | POA: Diagnosis not present

## 2023-01-10 DIAGNOSIS — C7951 Secondary malignant neoplasm of bone: Secondary | ICD-10-CM | POA: Diagnosis not present

## 2023-01-10 DIAGNOSIS — I2510 Atherosclerotic heart disease of native coronary artery without angina pectoris: Secondary | ICD-10-CM | POA: Insufficient documentation

## 2023-01-10 DIAGNOSIS — I110 Hypertensive heart disease with heart failure: Secondary | ICD-10-CM | POA: Insufficient documentation

## 2023-01-10 HISTORY — PX: RIGHT/LEFT HEART CATH AND CORONARY ANGIOGRAPHY: CATH118266

## 2023-01-10 LAB — POCT I-STAT 7, (LYTES, BLD GAS, ICA,H+H)
Acid-base deficit: 4 mmol/L — ABNORMAL HIGH (ref 0.0–2.0)
Bicarbonate: 22.4 mmol/L (ref 20.0–28.0)
Calcium, Ion: 1.18 mmol/L (ref 1.15–1.40)
HCT: 36 % (ref 36.0–46.0)
Hemoglobin: 12.2 g/dL (ref 12.0–15.0)
O2 Saturation: 94 %
Potassium: 3.6 mmol/L (ref 3.5–5.1)
Sodium: 141 mmol/L (ref 135–145)
TCO2: 24 mmol/L (ref 22–32)
pCO2 arterial: 42.5 mmHg (ref 32–48)
pH, Arterial: 7.329 — ABNORMAL LOW (ref 7.35–7.45)
pO2, Arterial: 75 mmHg — ABNORMAL LOW (ref 83–108)

## 2023-01-10 LAB — POCT I-STAT EG7
Acid-base deficit: 3 mmol/L — ABNORMAL HIGH (ref 0.0–2.0)
Acid-base deficit: 3 mmol/L — ABNORMAL HIGH (ref 0.0–2.0)
Bicarbonate: 24.1 mmol/L (ref 20.0–28.0)
Bicarbonate: 23.9 mmol/L (ref 20.0–28.0)
Calcium, Ion: 1.26 mmol/L (ref 1.15–1.40)
Calcium, Ion: 1.21 mmol/L (ref 1.15–1.40)
HCT: 37 % (ref 36.0–46.0)
HCT: 37 % (ref 36.0–46.0)
Hemoglobin: 12.6 g/dL (ref 12.0–15.0)
Hemoglobin: 12.6 g/dL (ref 12.0–15.0)
O2 Saturation: 74 %
O2 Saturation: 67 %
Potassium: 3.7 mmol/L (ref 3.5–5.1)
Potassium: 3.7 mmol/L (ref 3.5–5.1)
Sodium: 141 mmol/L (ref 135–145)
Sodium: 140 mmol/L (ref 135–145)
TCO2: 26 mmol/L (ref 22–32)
TCO2: 25 mmol/L (ref 22–32)
pCO2, Ven: 48.2 mmHg (ref 44–60)
pCO2, Ven: 47.6 mmHg (ref 44–60)
pH, Ven: 7.308 (ref 7.25–7.43)
pH, Ven: 7.31 (ref 7.25–7.43)
pO2, Ven: 43 mmHg (ref 32–45)
pO2, Ven: 38 mmHg (ref 32–45)

## 2023-01-10 LAB — GLUCOSE, CAPILLARY: Glucose-Capillary: 125 mg/dL — ABNORMAL HIGH (ref 70–99)

## 2023-01-10 IMAGING — DX DG CHEST 2V
2 series · 2 of 2 positions shown · non-contrast
Comparison: 10/14/2020

CLINICAL DATA: Chest pain, shortness of breath

EXAM:
CHEST - 2 VIEW

[chest pa]
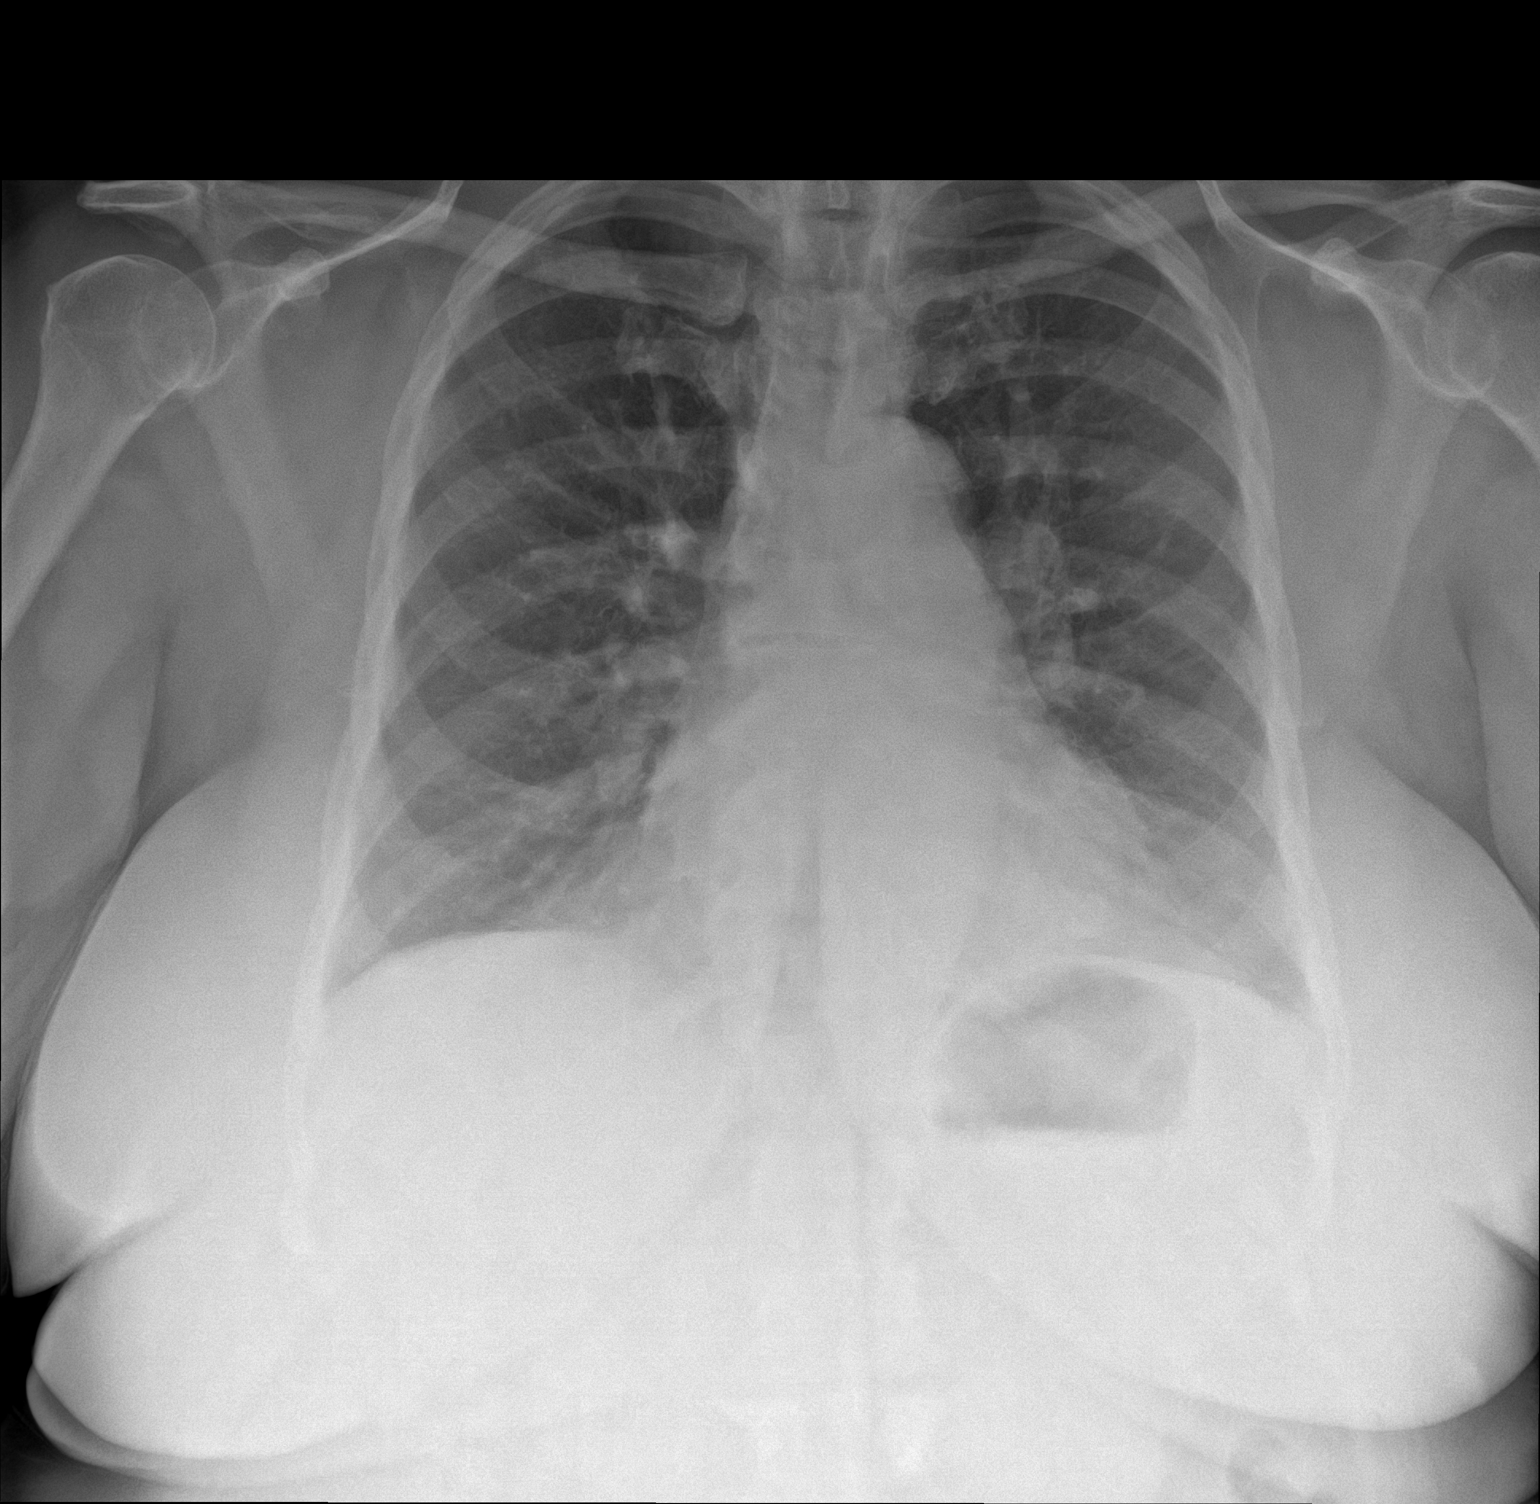

[chest lat]
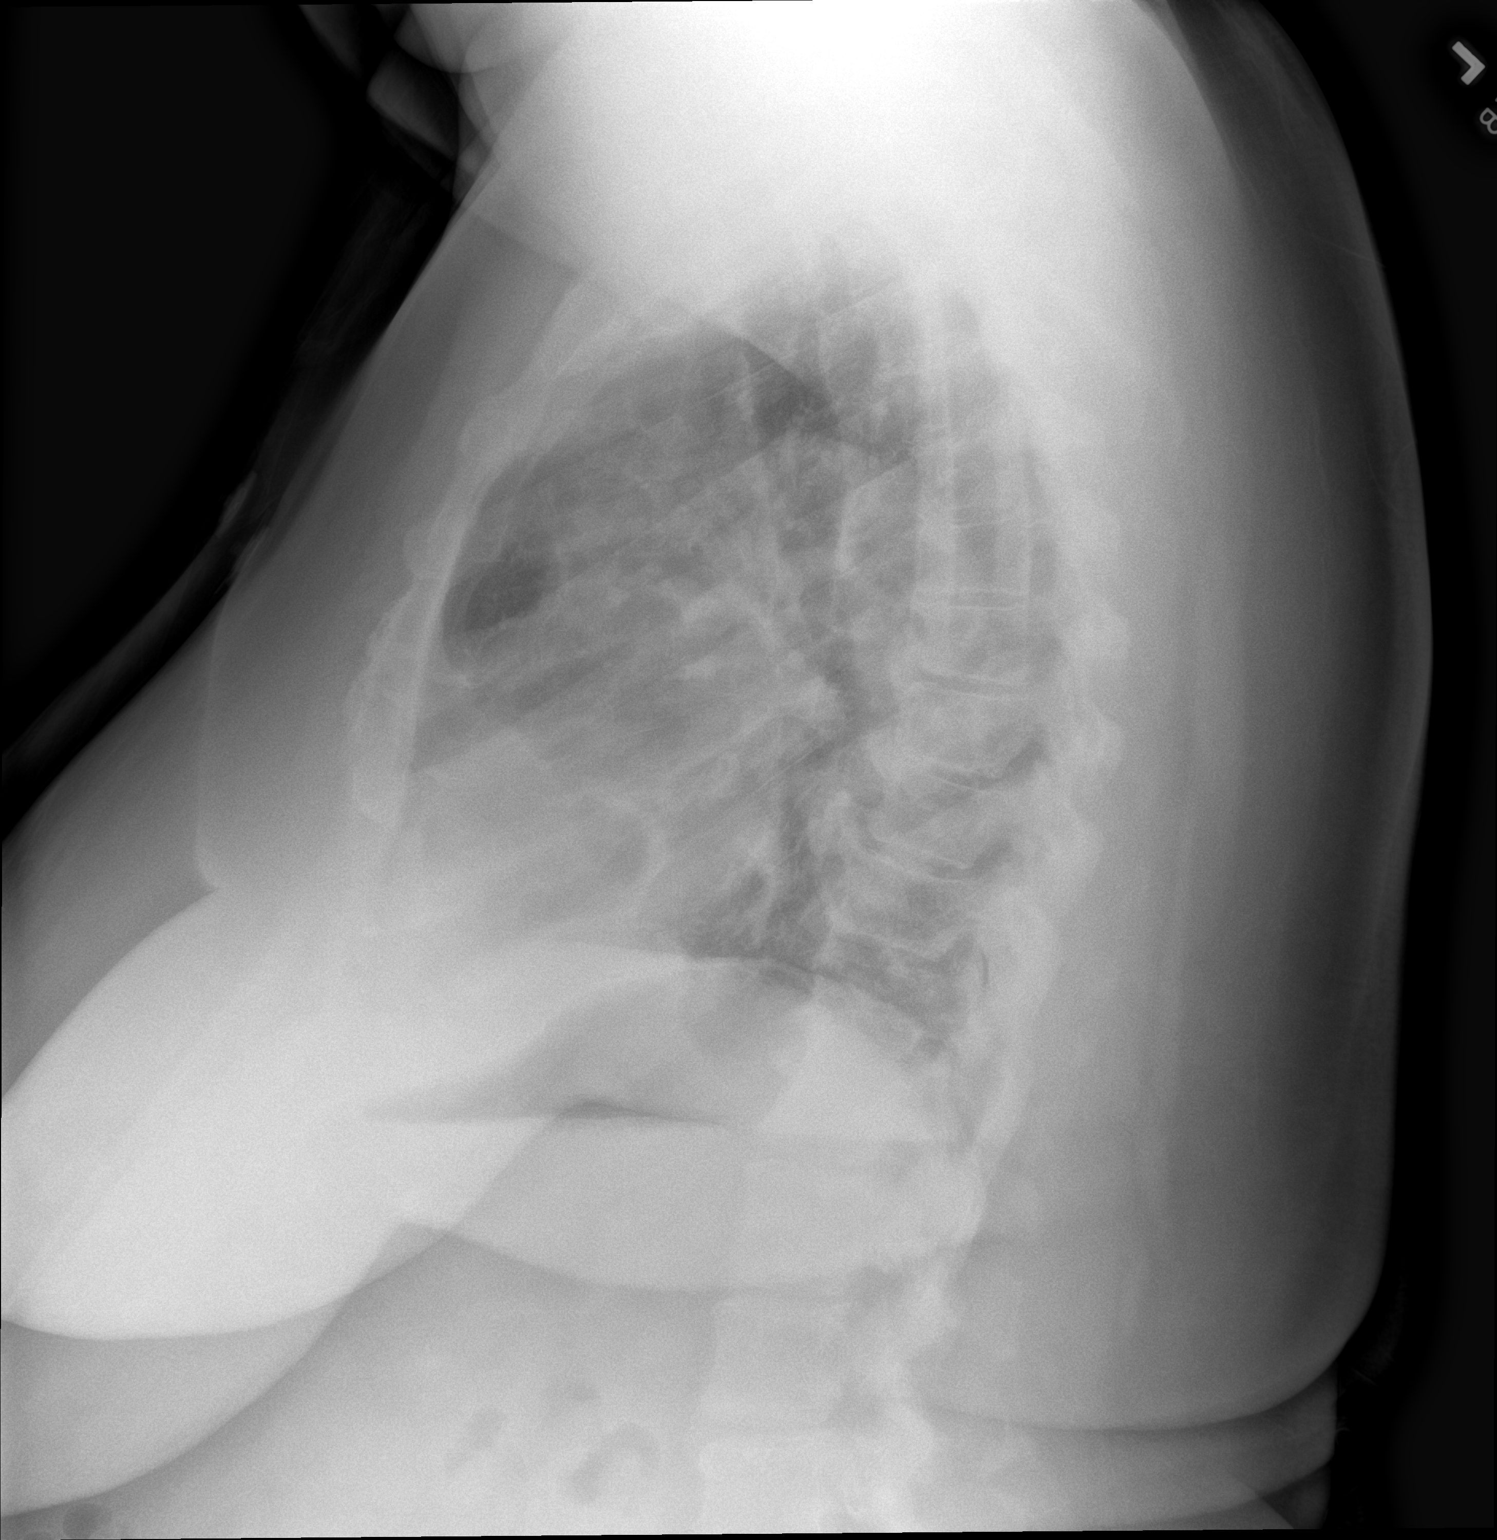

[2 of 2 positions shown; findings below may reference images not displayed]

FINDINGS: Heart and mediastinal contours are within normal limits. No focal
opacities or effusions. No acute bony abnormality.
IMPRESSION: No active cardiopulmonary disease.

## 2023-01-10 IMAGING — CT CT ANGIO CHEST
2 of 7 series · 17 of 46 positions shown · IV contrast (omnipaque)
Comparison: Radiograph earlier today.  Chest CTA 10/14/2020

CLINICAL DATA: PE suspected, high prob ResultsHistory of PE,
similar pain or shortness of breath. Recent left leg pain and
swelling.

EXAM:
CT ANGIOGRAPHY CHEST WITH CONTRAST
TECHNIQUE: Multidetector CT imaging of the chest was performed using the
standard protocol during bolus administration of intravenous
contrast. Multiplanar CT image reconstructions and MIPs were
obtained to evaluate the vascular anatomy.
CONTRAST:  100mL OMNIPAQUE IOHEXOL 350 MG/ML SOLN

[Series 5: pe axial thins · axial · 0.67mm/px · z∈[+980,+1217]mm · 14 of 275 slices shown]
[im 19/275  lung]
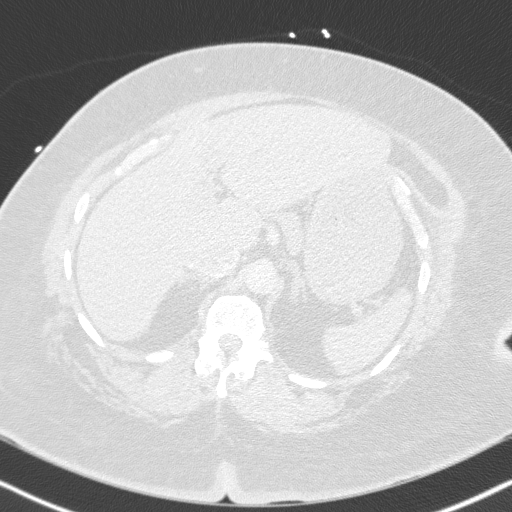
[im 37/275  soft-tissue]
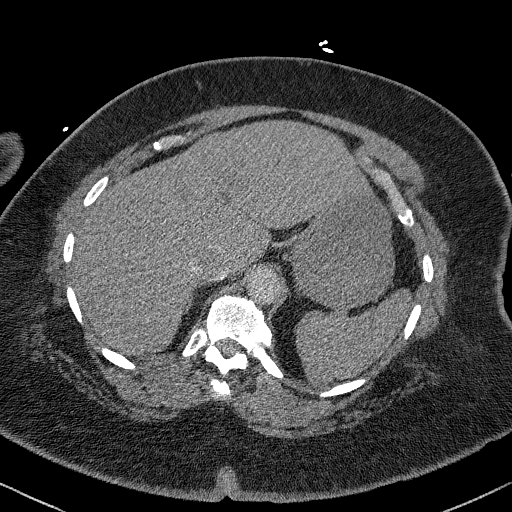
[im 55/275  lung]
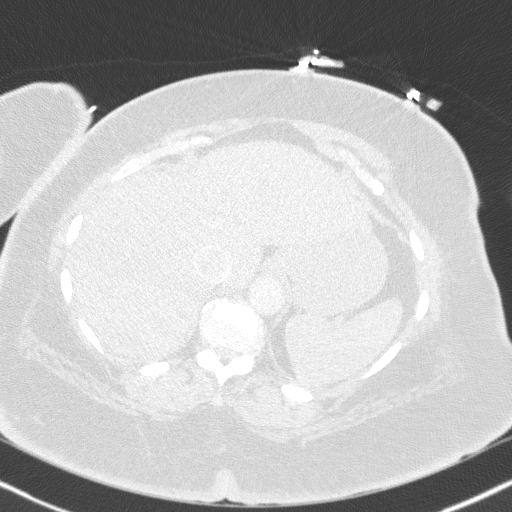
[im 74/275  soft-tissue]
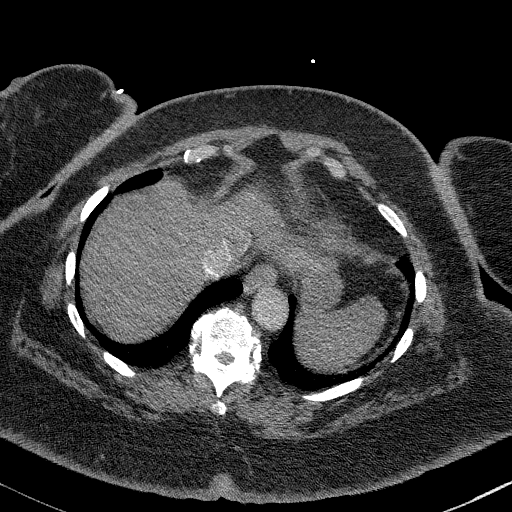
[im 92/275  lung]
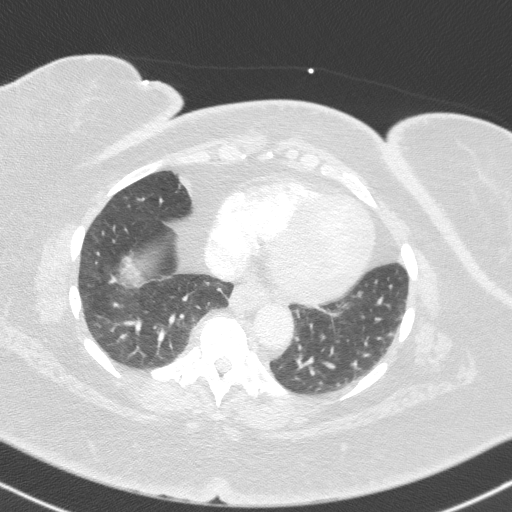
[im 110/275  soft-tissue]
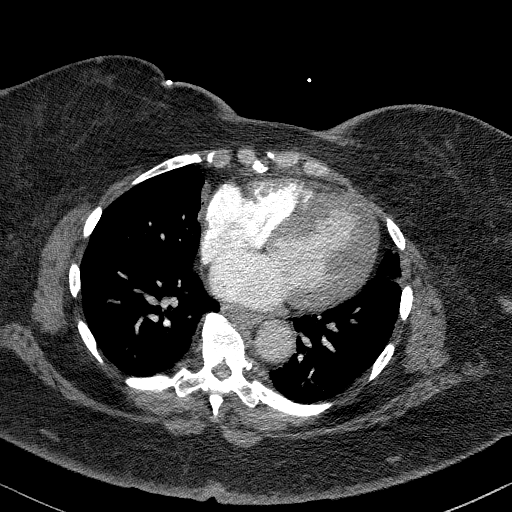
[im 128/275  lung]
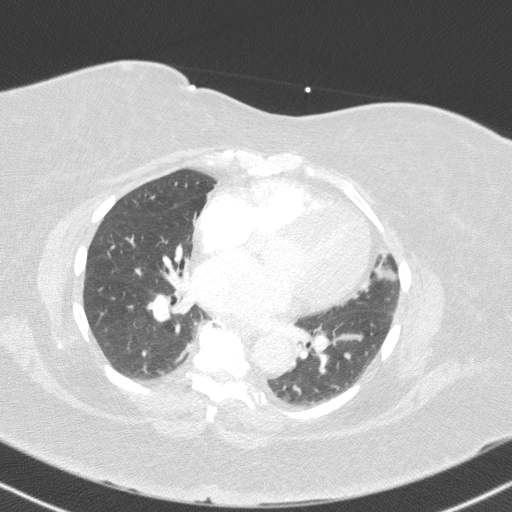
[im 147/275  soft-tissue]
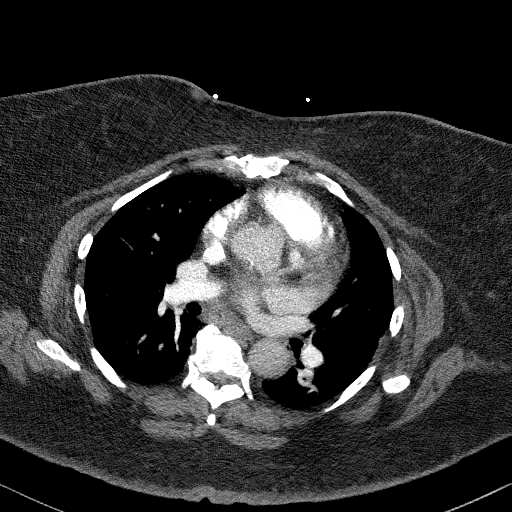
[im 165/275  lung]
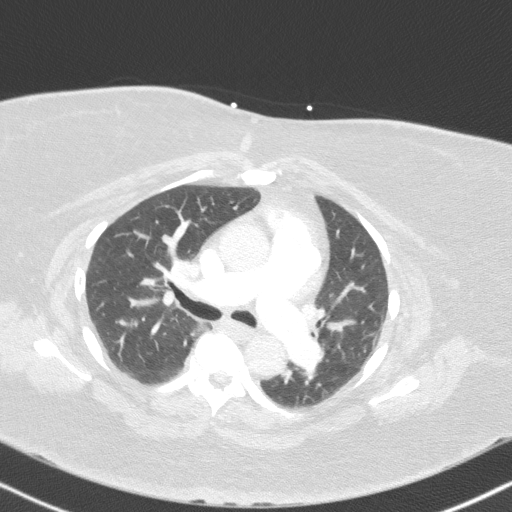
[im 183/275  soft-tissue]
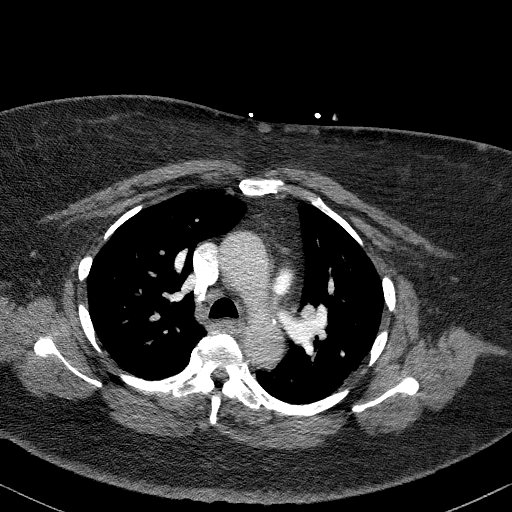
[im 201/275  lung]
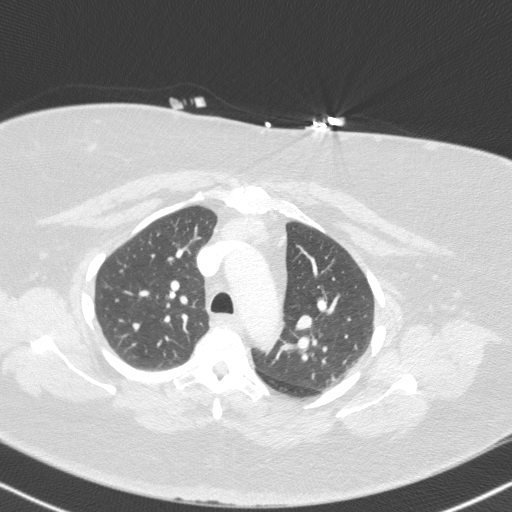
[im 220/275  soft-tissue]
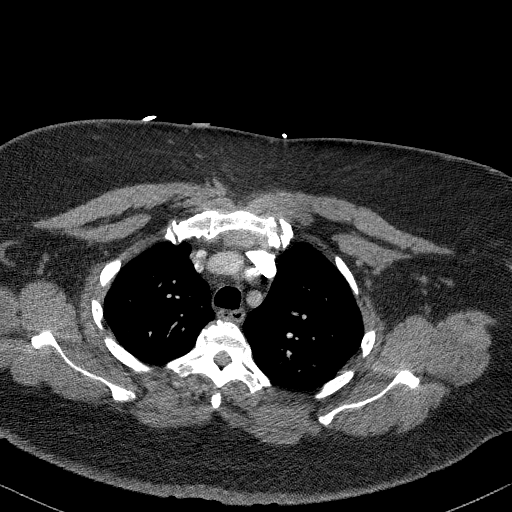
[im 238/275  lung]
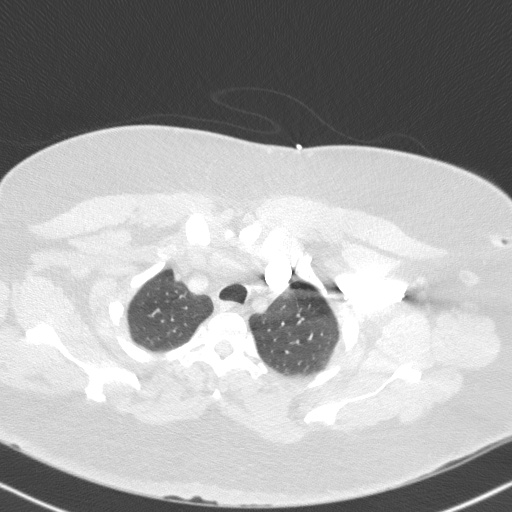
[im 256/275  soft-tissue]
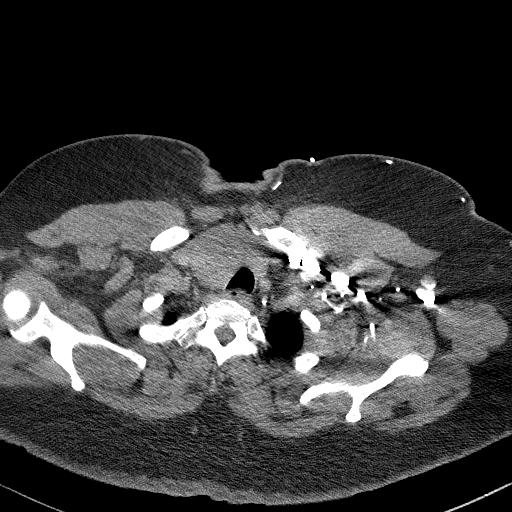

[Series 7: cor soft · coronal · 0.54mm/px · 3 of 151 slices shown]
[im 38/151  soft-tissue]
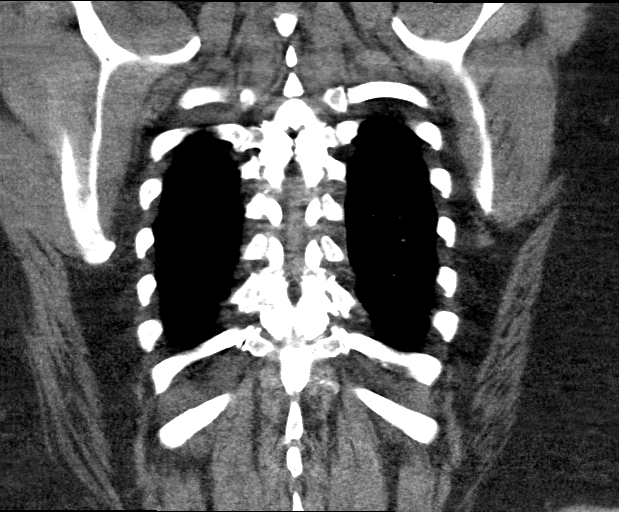
[im 76/151  soft-tissue]
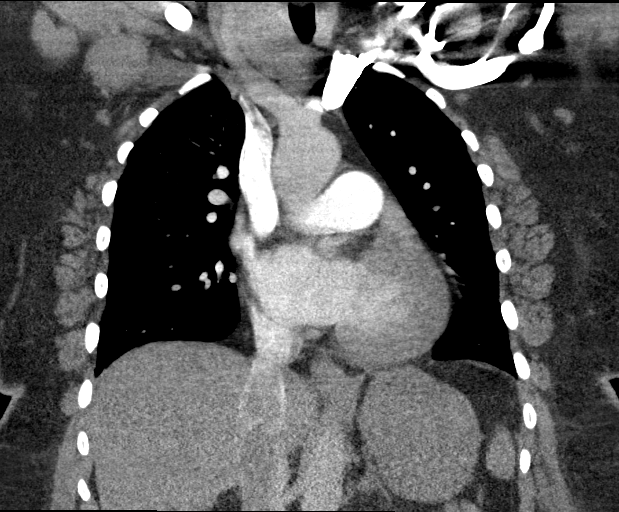
[im 113/151  soft-tissue]
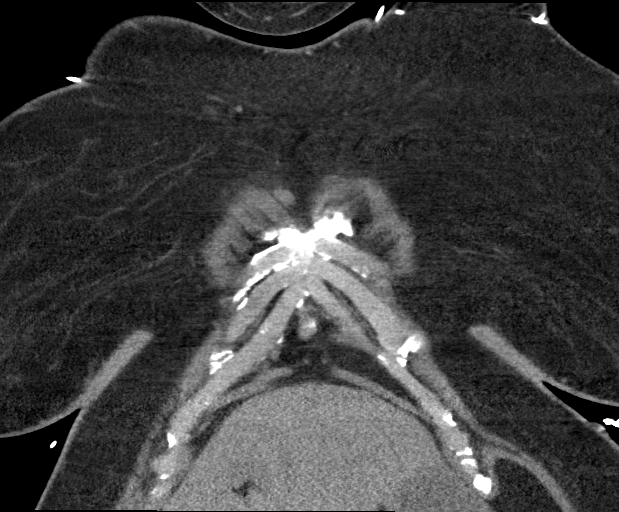

[17 of 46 positions shown; findings below may reference images not displayed]

FINDINGS: Cardiovascular: There are no filling defects within the pulmonary
arteries to suggest pulmonary embolus. Subsegmental branches are
suboptimally assessed due to soft tissue attenuation from habitus.
Stable prominent main pulmonary artery at 3 cm. Normal caliber
thoracic aorta. Common origin of the brachiocephalic and left common
carotid artery, variant arch anatomy. Normal heart size. Small
pericardial effusion measures up to 13 mm in depth adjacent to the
right ventricle. This has slightly increased in size from prior exam

Mediastinum/Nodes: Stable rounded retrosternal density that is
likely exophytic from enlarged right lobe of the thyroid gland. The
previous 3.4 cm right thyroid nodule is not well-defined on the
current exam. Prior thyroid ultrasound, 05/27/2020. Similar leftward
tracheal deviation. Small mediastinal and hilar lymph nodes are not
enlarged by size criteria. There is mild distal esophageal wall
thickening.

Lungs/Pleura: Mild lingular scarring and subsegmental atelectasis in
the lower lobes. No consolidation or evidence of pneumonia. No
findings of pulmonary edema. No pleural effusion. The trachea and
central bronchi are patent.

Upper Abdomen: Prominent liver, partially included. No acute upper
abdominal findings.

Musculoskeletal: Mild thoracic spondylosis with endplate spurring.
There are no acute or suspicious osseous abnormalities.

Review of the MIP images confirms the above findings.
IMPRESSION: 1. No pulmonary embolus.
2. Small pericardial effusion, slightly increased in size from prior
exam.
3. Mild distal esophageal wall thickening, can be seen with reflux
or esophagitis.

## 2023-01-10 SURGERY — RIGHT/LEFT HEART CATH AND CORONARY ANGIOGRAPHY
Anesthesia: LOCAL

## 2023-01-10 MED ORDER — MIDAZOLAM HCL 2 MG/2ML IJ SOLN
INTRAMUSCULAR | Status: DC | PRN
  Administered 2023-01-10 (×2): 1 mg via INTRAVENOUS

## 2023-01-10 MED ORDER — HEPARIN SODIUM (PORCINE) 1000 UNIT/ML IJ SOLN
INTRAMUSCULAR | Status: DC | PRN
  Administered 2023-01-10: 5000 [IU] via INTRAVENOUS

## 2023-01-10 MED ORDER — LABETALOL HCL 5 MG/ML IV SOLN: 10.0000 mg | INTRAVENOUS | Status: DC | PRN

## 2023-01-10 MED ORDER — SODIUM CHLORIDE 0.9 % IV SOLN: 250.0000 mL | INTRAVENOUS | Status: DC | PRN

## 2023-01-10 MED ORDER — ASPIRIN 81 MG PO CHEW: 81.0000 mg | CHEWABLE_TABLET | ORAL | Status: DC

## 2023-01-10 MED ORDER — SODIUM CHLORIDE 0.9 % WEIGHT BASED INFUSION
3.0000 mL/kg/h | INTRAVENOUS | Status: AC
  Administered 2023-01-10: 3 mL/kg/h via INTRAVENOUS

## 2023-01-10 MED ORDER — VERAPAMIL HCL 2.5 MG/ML IV SOLN
INTRAVENOUS | Status: AC
  Filled 2023-01-10: qty 2

## 2023-01-10 MED ORDER — ONDANSETRON HCL 4 MG/2ML IJ SOLN: 4.0000 mg | Freq: Four times a day (QID) | INTRAMUSCULAR | Status: DC | PRN

## 2023-01-10 MED ORDER — HEPARIN (PORCINE) IN NACL 1000-0.9 UT/500ML-% IV SOLN
INTRAVENOUS | Status: DC | PRN
  Administered 2023-01-10 (×2): 500 mL

## 2023-01-10 MED ORDER — ACETAMINOPHEN 325 MG PO TABS: 650.0000 mg | ORAL_TABLET | ORAL | Status: DC | PRN

## 2023-01-10 MED ORDER — SODIUM CHLORIDE 0.9% FLUSH: 3.0000 mL | INTRAVENOUS | Status: DC | PRN

## 2023-01-10 MED ORDER — LIDOCAINE HCL (PF) 1 % IJ SOLN
INTRAMUSCULAR | Status: DC | PRN
  Administered 2023-01-10: 5 mL

## 2023-01-10 MED ORDER — FENTANYL CITRATE (PF) 100 MCG/2ML IJ SOLN
INTRAMUSCULAR | Status: AC
  Filled 2023-01-10: qty 2

## 2023-01-10 MED ORDER — FENTANYL CITRATE (PF) 100 MCG/2ML IJ SOLN
INTRAMUSCULAR | Status: DC | PRN
  Administered 2023-01-10 (×2): 25 ug via INTRAVENOUS

## 2023-01-10 MED ORDER — IOHEXOL 350 MG/ML SOLN
INTRAVENOUS | Status: DC | PRN
  Administered 2023-01-10: 90 mL

## 2023-01-10 MED ORDER — MIDAZOLAM HCL 2 MG/2ML IJ SOLN
INTRAMUSCULAR | Status: AC
  Filled 2023-01-10: qty 2

## 2023-01-10 MED ORDER — SODIUM CHLORIDE 0.9 % WEIGHT BASED INFUSION: 1.0000 mL/kg/h | INTRAVENOUS | Status: DC

## 2023-01-10 MED ORDER — LIDOCAINE HCL (PF) 1 % IJ SOLN
INTRAMUSCULAR | Status: AC
  Filled 2023-01-10: qty 30

## 2023-01-10 MED ORDER — SODIUM CHLORIDE 0.9 % IV SOLN: INTRAVENOUS | Status: DC

## 2023-01-10 MED ORDER — VERAPAMIL HCL 2.5 MG/ML IV SOLN
INTRAVENOUS | Status: DC | PRN
  Administered 2023-01-10: 10 mL via INTRA_ARTERIAL

## 2023-01-10 MED ORDER — HEPARIN SODIUM (PORCINE) 1000 UNIT/ML IJ SOLN
INTRAMUSCULAR | Status: AC
  Filled 2023-01-10: qty 10

## 2023-01-10 MED ORDER — SODIUM CHLORIDE 0.9% FLUSH: 3.0000 mL | Freq: Two times a day (BID) | INTRAVENOUS | Status: DC

## 2023-01-10 MED ORDER — HYDRALAZINE HCL 20 MG/ML IJ SOLN: 10.0000 mg | INTRAMUSCULAR | Status: DC | PRN

## 2023-01-10 SURGICAL SUPPLY — 13 items
CATH BALLN WEDGE 5F 110CM (CATHETERS) IMPLANT
CATH INFINITI 5 FR JL3.5 (CATHETERS) IMPLANT
CATH INFINITI 6F ANG MULTIPACK (CATHETERS) IMPLANT
CATH INFINITI 6FR TIG (CATHETERS) IMPLANT
DEVICE RAD COMP TR BAND LRG (VASCULAR PRODUCTS) IMPLANT
ELECT DEFIB PAD ADLT CADENCE (PAD) IMPLANT
GLIDESHEATH SLEND SS 6F .021 (SHEATH) IMPLANT
GUIDEWIRE .025 260CM (WIRE) IMPLANT
PACK CARDIAC CATHETERIZATION (CUSTOM PROCEDURE TRAY) ×1 IMPLANT
SET ATX-X65L (MISCELLANEOUS) IMPLANT
SHEATH GLIDE SLENDER 4/5FR (SHEATH) IMPLANT
SHEATH PROBE COVER 6X72 (BAG) IMPLANT
WIRE EMERALD 3MM-J .035X260CM (WIRE) IMPLANT

## 2023-01-10 NOTE — Progress Notes (Signed)
Patient and daughter was given discharge instructions. Both verbalized understanding.

## 2023-01-10 NOTE — Interval H&P Note (Signed)
History and Physical Interval Note:  01/10/2023 9:38 AM  [name]  has presented today for surgery, with the diagnosis of chest pain.  The various methods of treatment have been discussed with the patient and family. After consideration of risks, benefits and other options for treatment, the patient has consented to  Procedure(s): RIGHT/LEFT HEART CATH AND CORONARY ANGIOGRAPHY (N/A) as a surgical intervention.  The patient's history has been reviewed, patient examined, no change in status, stable for surgery.  I have reviewed the patient's chart and labs.  Questions were answered to the patient's satisfaction.     Orbie Pyo

## 2023-01-10 NOTE — Discharge Instructions (Addendum)
Restart metformin on 01/13/23  Drink plenty of fluid for 48 hours and keep wrist elevated at heart level for 24 hours  Radial Site Care   This sheet gives you information about how to care for yourself after your procedure. Your health care provider may also give you more specific instructions. If you have problems or questions, contact your health care provider. What can I expect after the procedure? After the procedure, it is common to have: Bruising and tenderness at the catheter insertion area. Follow these instructions at home: Medicines Take over-the-counter and prescription medicines only as told by your health care provider. Insertion site care Follow instructions from your health care provider about how to take care of your insertion site. Make sure you: Wash your hands with soap and water before you change your bandage (dressing). If soap and water are not available, use hand sanitizer. remove your dressing as told by your health care provider. In 24 hours Check your insertion site every day for signs of infection. Check for: Redness, swelling, or pain. Fluid or blood. Pus or a bad smell. Warmth. Do not take baths, swim, or use a hot tub until your health care provider approves. You may shower 24-48 hours after the procedure, or as directed by your health care provider. Remove the dressing and gently wash the site with plain soap and water. Pat the area dry with a clean towel. Do not rub the site. That could cause bleeding. Do not apply powder or lotion to the site. Activity   For 24 hours after the procedure, or as directed by your health care provider: Do not flex or bend the affected arm. Do not push or pull heavy objects with the affected arm. Do not drive yourself home from the hospital or clinic. You may drive 24 hours after the procedure unless your health care provider tells you not to. Do not operate machinery or power tools. Do not lift anything that is heavier  than 10 lb (4.5 kg), or the limit that you are told, until your health care provider says that it is safe. For 4 days Ask your health care provider when it is okay to: Return to work or school. Resume usual physical activities or sports. Resume sexual activity. General instructions If the catheter site starts to bleed, raise your arm and put firm pressure on the site. If the bleeding does not stop, get help right away. This is a medical emergency. If you went home on the same day as your procedure, a responsible adult should be with you for the first 24 hours after you arrive home. Keep all follow-up visits as told by your health care provider. This is important. Contact a health care provider if: You have a fever. You have redness, swelling, or yellow drainage around your insertion site. Get help right away if: You have unusual pain at the radial site. The catheter insertion area swells very fast. The insertion area is bleeding, and the bleeding does not stop when you hold steady pressure on the area. Your arm or hand becomes pale, cool, tingly, or numb. These symptoms may represent a serious problem that is an emergency. Do not wait to see if the symptoms will go away. Get medical help right away. Call your local emergency services (911 in the [place] Do not drive yourself to the hospital. Summary After the procedure, it is common to have bruising and tenderness at the site. Follow instructions from your health care provider about how to  take care of your radial site wound. Check the wound every day for signs of infection. Do not lift anything that is heavier than 10 lb (4.5 kg), or the limit that you are told, until your health care provider says that it is safe. This information is not intended to replace advice given to you by your health care provider. Make sure you discuss any questions you have with your health care provider. Document Revised: 05/16/2017 Document Reviewed:  05/16/2017 Elsevier Patient Education  2020 [employer].

## 2023-01-11 ENCOUNTER — Encounter (HOSPITAL_COMMUNITY): Payer: Self-pay | Admitting: Internal Medicine

## 2023-01-11 ENCOUNTER — Inpatient Hospital Stay: Payer: 59 | Admitting: Hematology and Oncology

## 2023-01-11 ENCOUNTER — Ambulatory Visit (INDEPENDENT_AMBULATORY_CARE_PROVIDER_SITE_OTHER): Payer: 59 | Admitting: Adult Health

## 2023-01-11 VITALS — BP 126/81 | HR 62 | Temp 98.0°F | Ht 59.0 in | Wt 207.0 lb

## 2023-01-11 DIAGNOSIS — F419 Anxiety disorder, unspecified: Secondary | ICD-10-CM

## 2023-01-11 DIAGNOSIS — Z7984 Long term (current) use of oral hypoglycemic drugs: Secondary | ICD-10-CM

## 2023-01-11 DIAGNOSIS — F32A Depression, unspecified: Secondary | ICD-10-CM

## 2023-01-11 DIAGNOSIS — E1165 Type 2 diabetes mellitus with hyperglycemia: Secondary | ICD-10-CM | POA: Diagnosis not present

## 2023-01-11 DIAGNOSIS — R0789 Other chest pain: Secondary | ICD-10-CM

## 2023-01-11 DIAGNOSIS — E559 Vitamin D deficiency, unspecified: Secondary | ICD-10-CM | POA: Diagnosis not present

## 2023-01-11 DIAGNOSIS — Z6841 Body Mass Index (BMI) 40.0 and over, adult: Secondary | ICD-10-CM

## 2023-01-11 DIAGNOSIS — E669 Obesity, unspecified: Secondary | ICD-10-CM

## 2023-01-11 MED ORDER — SERTRALINE HCL 25 MG PO TABS
25.0000 mg | ORAL_TABLET | Freq: Every day | ORAL | 0 refills | Status: DC
Start: 2023-01-11 — End: 2023-04-03

## 2023-01-11 MED ORDER — METFORMIN HCL 500 MG PO TABS
ORAL_TABLET | ORAL | 0 refills | Status: DC
Start: 2023-01-11 — End: 2023-02-07

## 2023-01-11 NOTE — Progress Notes (Signed)
WEIGHT SUMMARY AND BIOMETRICS  Vitals Temp: 98 F (36.7 C) BP: 126/81 Pulse Rate: 62 SpO2: 97 %   Anthropometric Measurements Height: 4\' 11"  (1.499 m) Weight: 207 lb (93.9 kg) BMI (Calculated): 41.79 Weight at Last Visit: 207lb Weight Lost Since Last Visit: 0 Weight Gained Since Last Visit: 0 Starting Weight: 209lb Total Weight Loss (lbs): 2 lb (0.907 kg)   Body Composition  Body Fat %: 48.9 % Fat Mass (lbs): 101.6 lbs Muscle Mass (lbs): 100.8 lbs Total Body Water (lbs): 76.2 lbs Visceral Fat Rating : 17   Other Clinical Data Fasting: yes Labs: no Today's Visit #: 16 Starting Date: 06/01/21    Chief Complaint:   OBESITY [name] is here to discuss her progress with her obesity treatment plan. She is on the the Category 2 Plan and states she is following her eating plan approximately 70 % of the time. She states she is not currently exercising.   Interim History:  Due to persistent Atypical CP- underwent RIGHT/LEFT HEART CATH AND CORONARY ANGIOGRAPHY 01/10/2023  Reviewed Bioimpedance results with pt: Muscle Mass: +3.2 lbs Adipose Mass: -3.4 lbs  Subjective:   1. Atypical chest pain 01/10/2023 RIGHT/LEFT HEART CATH AND CORONARY ANGIOGRAPHY  She reports "slight chest pressure" at present. She denies chest pain with exertion. She denies dyspnea at present.  2. Type 2 diabetes mellitus with hyperglycemia, without long-term current use of insulin (HCC) Lab Results  Component Value Date   HGBA1C 7.9 (H) 12/11/2022   HGBA1C 7.3 (H) 07/15/2022   HGBA1C 6.4 (H) 09/28/2021    A1c above goal She is on daily Jardiance 10mg  daily and stopped daily Metformin 500mg  > 72 hours ago. She reports abdominal cramping and loose stools with Metformin 500mg  use. Ozempic caused GI upset and weight gain.  3. Vitamin D deficiency  Latest Reference Range & Units 08/14/22 12:44  Vitamin D, 25-Hydroxy 30.0 - 100.0 ng/mL 30.3   She has been on weekly Ergocalciferol-  denies N/V/Muscle Weakness  4. Anxiety and depression She reports stable mood, denis SI/HI She is on daily Sertraline 25mg   Assessment/Plan:   1. Atypical chest pain F/u with Cardiology as directed.  2. Type 2 diabetes mellitus with hyperglycemia, without long-term current use of insulin (HCC) Restart - metFORMIN (GLUCOPHAGE) 500 MG tablet; 1/2 tab daily with breakfast  Dispense: 30 tablet; Refill: 0  3. Vitamin D deficiency Check Labs - VITAMIN D 25 Hydroxy (Vit-D Deficiency, Fractures) MyChart message after labs and send in Ergocalciferol as appropriate- Send to [facility]  4. Anxiety and depression Refill - sertraline (ZOLOFT) 25 MG tablet; Take 1 tablet (25 mg total) by mouth daily.  Dispense: 90 tablet; Refill: 0  5. Obesity, current BMI of 41.79  [name] is currently in the action stage of change. As such, her goal is to continue with weight loss efforts. She has agreed to the Category 2 Plan.   Exercise goals: No exercise has been prescribed at this time.  Behavioral modification strategies: increasing lean protein intake, decreasing simple carbohydrates, increasing vegetables, increasing water intake, no skipping meals, meal planning and cooking strategies, and planning for success.  [name] has agreed to follow-up with our clinic in 4 weeks. She was informed of the importance of frequent follow-up visits to maximize her success with intensive lifestyle modifications for her multiple health conditions.   [name] was informed we would discuss her lab results at her next visit unless there is a critical issue that needs to be addressed sooner. [name]  agreed to keep her next visit at the agreed upon time to discuss these results.  Objective:   Blood pressure 126/81, pulse 62, temperature 98 F (36.7 C), height 4\' 11"  (1.499 m), weight 207 lb (93.9 kg), SpO2 97%. Body mass index is 41.81 kg/m.  General: Cooperative, alert, well developed, in no acute distress. HEENT:  Conjunctivae and lids unremarkable. Cardiovascular: Regular rhythm.  Lungs: Normal work of breathing. Neurologic: No focal deficits.   Lab Results  Component Value Date   CREATININE 0.97 01/04/2023   BUN 20 01/04/2023   NA 140 01/10/2023   K 3.7 01/10/2023   CL 106 01/04/2023   CO2 25 01/04/2023   Lab Results  Component Value Date   ALT 24 10/15/2022   AST 19 10/15/2022   ALKPHOS 58 10/15/2022   BILITOT 0.6 10/15/2022   Lab Results  Component Value Date   HGBA1C 7.9 (H) 12/11/2022   HGBA1C 7.3 (H) 07/15/2022   HGBA1C 6.4 (H) 09/28/2021   HGBA1C 8.4 (H) 03/08/2021   HGBA1C 7.6 (H) 02/03/2020   Lab Results  Component Value Date   INSULIN 13.4 08/14/2022   INSULIN 22.1 09/28/2021   INSULIN 26.8 (H) 06/01/2021   INSULIN 44.4 (H) 02/03/2020   INSULIN 28.1 (H) 12/26/2018   Lab Results  Component Value Date   TSH 0.746 11/07/2022   Lab Results  Component Value Date   CHOL 127 07/16/2022   HDL 48 07/16/2022   LDLCALC 55 07/16/2022   TRIG 119 07/16/2022   CHOLHDL 2.6 07/16/2022   Lab Results  Component Value Date   VD25OH 30.3 08/14/2022   VD25OH 39.8 09/28/2021   VD25OH 30.0 06/01/2021   Lab Results  Component Value Date   WBC 4.6 01/04/2023   HGB 12.6 01/10/2023   HCT 37.0 01/10/2023   MCV 89 01/04/2023   PLT 189 01/04/2023   No results found for: "IRON", "TIBC", "FERRITIN"  Attestation Statements:   Reviewed by clinician on day of visit: allergies, medications, problem list, medical history, surgical history, family history, social history, and previous encounter notes.  I have reviewed the above documentation for accuracy and completeness, and I agree with the above. -  Marise Knapper d. Kardell Virgil, NP-C

## 2023-01-12 ENCOUNTER — Encounter (INDEPENDENT_AMBULATORY_CARE_PROVIDER_SITE_OTHER): Payer: Self-pay | Admitting: Adult Health

## 2023-01-12 ENCOUNTER — Other Ambulatory Visit (INDEPENDENT_AMBULATORY_CARE_PROVIDER_SITE_OTHER): Payer: Self-pay | Admitting: Adult Health

## 2023-01-12 DIAGNOSIS — E559 Vitamin D deficiency, unspecified: Secondary | ICD-10-CM

## 2023-01-12 LAB — VITAMIN D 25 HYDROXY (VIT D DEFICIENCY, FRACTURES): Vit D, 25-Hydroxy: 33.5 ng/mL (ref 30.0–100.0)

## 2023-01-12 MED ORDER — VITAMIN D (ERGOCALCIFEROL) 1.25 MG (50000 UNIT) PO CAPS: 50000.0000 [IU] | ORAL_CAPSULE | ORAL | 0 refills | Status: DC

## 2023-01-17 ENCOUNTER — Other Ambulatory Visit: Payer: Self-pay

## 2023-01-17 ENCOUNTER — Encounter: Payer: Self-pay | Admitting: General Practice

## 2023-01-17 ENCOUNTER — Inpatient Hospital Stay: Payer: 59 | Attending: Hematology and Oncology | Admitting: Hematology and Oncology

## 2023-01-17 VITALS — BP 117/60 | HR 75 | Temp 97.5°F | Resp 18 | Ht 59.0 in | Wt 201.1 lb

## 2023-01-17 DIAGNOSIS — I110 Hypertensive heart disease with heart failure: Secondary | ICD-10-CM | POA: Diagnosis not present

## 2023-01-17 DIAGNOSIS — Z880 Allergy status to penicillin: Secondary | ICD-10-CM | POA: Diagnosis not present

## 2023-01-17 DIAGNOSIS — I509 Heart failure, unspecified: Secondary | ICD-10-CM | POA: Diagnosis not present

## 2023-01-17 DIAGNOSIS — Z823 Family history of stroke: Secondary | ICD-10-CM | POA: Insufficient documentation

## 2023-01-17 DIAGNOSIS — R079 Chest pain, unspecified: Secondary | ICD-10-CM | POA: Insufficient documentation

## 2023-01-17 DIAGNOSIS — N644 Mastodynia: Secondary | ICD-10-CM | POA: Insufficient documentation

## 2023-01-17 DIAGNOSIS — E119 Type 2 diabetes mellitus without complications: Secondary | ICD-10-CM | POA: Diagnosis not present

## 2023-01-17 DIAGNOSIS — Z83438 Family history of other disorder of lipoprotein metabolism and other lipidemia: Secondary | ICD-10-CM | POA: Insufficient documentation

## 2023-01-17 DIAGNOSIS — Z79899 Other long term (current) drug therapy: Secondary | ICD-10-CM | POA: Insufficient documentation

## 2023-01-17 DIAGNOSIS — Z90710 Acquired absence of both cervix and uterus: Secondary | ICD-10-CM | POA: Insufficient documentation

## 2023-01-17 DIAGNOSIS — N6312 Unspecified lump in the right breast, upper inner quadrant: Secondary | ICD-10-CM | POA: Insufficient documentation

## 2023-01-17 DIAGNOSIS — Z171 Estrogen receptor negative status [ER-]: Secondary | ICD-10-CM

## 2023-01-17 DIAGNOSIS — Z87891 Personal history of nicotine dependence: Secondary | ICD-10-CM | POA: Insufficient documentation

## 2023-01-17 DIAGNOSIS — Z800 Family history of malignant neoplasm of digestive organs: Secondary | ICD-10-CM | POA: Insufficient documentation

## 2023-01-17 DIAGNOSIS — Z923 Personal history of irradiation: Secondary | ICD-10-CM | POA: Diagnosis not present

## 2023-01-17 DIAGNOSIS — I2510 Atherosclerotic heart disease of native coronary artery without angina pectoris: Secondary | ICD-10-CM | POA: Diagnosis not present

## 2023-01-17 DIAGNOSIS — Z86711 Personal history of pulmonary embolism: Secondary | ICD-10-CM | POA: Diagnosis not present

## 2023-01-17 DIAGNOSIS — G4730 Sleep apnea, unspecified: Secondary | ICD-10-CM | POA: Insufficient documentation

## 2023-01-17 DIAGNOSIS — R0602 Shortness of breath: Secondary | ICD-10-CM | POA: Diagnosis not present

## 2023-01-17 DIAGNOSIS — E079 Disorder of thyroid, unspecified: Secondary | ICD-10-CM | POA: Insufficient documentation

## 2023-01-17 DIAGNOSIS — Z803 Family history of malignant neoplasm of breast: Secondary | ICD-10-CM | POA: Insufficient documentation

## 2023-01-17 DIAGNOSIS — Z8673 Personal history of transient ischemic attack (TIA), and cerebral infarction without residual deficits: Secondary | ICD-10-CM | POA: Insufficient documentation

## 2023-01-17 DIAGNOSIS — Z818 Family history of other mental and behavioral disorders: Secondary | ICD-10-CM | POA: Insufficient documentation

## 2023-01-17 DIAGNOSIS — Z8261 Family history of arthritis: Secondary | ICD-10-CM | POA: Insufficient documentation

## 2023-01-17 DIAGNOSIS — Z888 Allergy status to other drugs, medicaments and biological substances status: Secondary | ICD-10-CM | POA: Diagnosis not present

## 2023-01-17 DIAGNOSIS — Z882 Allergy status to sulfonamides status: Secondary | ICD-10-CM | POA: Insufficient documentation

## 2023-01-17 DIAGNOSIS — Z9049 Acquired absence of other specified parts of digestive tract: Secondary | ICD-10-CM | POA: Diagnosis not present

## 2023-01-17 DIAGNOSIS — C50411 Malignant neoplasm of upper-outer quadrant of right female breast: Secondary | ICD-10-CM

## 2023-01-17 DIAGNOSIS — Z833 Family history of diabetes mellitus: Secondary | ICD-10-CM | POA: Insufficient documentation

## 2023-01-17 DIAGNOSIS — Z811 Family history of alcohol abuse and dependence: Secondary | ICD-10-CM | POA: Insufficient documentation

## 2023-01-17 DIAGNOSIS — I890 Lymphedema, not elsewhere classified: Secondary | ICD-10-CM | POA: Diagnosis not present

## 2023-01-17 DIAGNOSIS — Z8249 Family history of ischemic heart disease and other diseases of the circulatory system: Secondary | ICD-10-CM | POA: Insufficient documentation

## 2023-01-17 DIAGNOSIS — Z885 Allergy status to narcotic agent status: Secondary | ICD-10-CM | POA: Diagnosis not present

## 2023-01-17 DIAGNOSIS — Z825 Family history of asthma and other chronic lower respiratory diseases: Secondary | ICD-10-CM | POA: Insufficient documentation

## 2023-01-17 DIAGNOSIS — Z814 Family history of other substance abuse and dependence: Secondary | ICD-10-CM | POA: Insufficient documentation

## 2023-01-17 NOTE — Assessment & Plan Note (Signed)
This is a very pleasant 62 year old female patient with past medical history significant for congestive heart failure, coronary artery disease, diabetes, hypertension, dyslipidemia history of pulmonary embolism back in 2008 for which she needed anticoagulation, rheumatoid arthritis, obstructive sleep apnea who is newly diagnosed with right breast IDC noticed on screening mammogram.    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  She is now s/p 4 cycles of TC and adjuvant radiation. No role for anti estrogen therapy since she had triple neg breast cancer.      Breast Pain and Lymphedema Improvement in breast pain. Lymphedema management paused due to chest pain concerns. No new breast masses on exam. -Resume lymphedema management with machine twice weekly. -Continue monitoring breast pain and lymphedema symptoms.  Chest Pain Recent episodes of chest pain leading to ER visits. Cardiac catheterization and stress test were normal. Patient suspects stress as a possible cause. -Encourage stress management techniques. -Continue monitoring chest pain symptoms.  General Health Maintenance Last mammogram was in April 2024. -Schedule next mammogram for April 2025.  Follow-up in six months.

## 2023-01-17 NOTE — Progress Notes (Signed)
[facility] CONSULT NOTE  Patient Care Team: Raymon Mutton., FNP as PCP - General (Family Medicine) Wendall Stade, MD as PCP - Cardiology (Cardiology) Pershing Proud, RN as Oncology Nurse Navigator Donnelly Angelica, RN as Oncology Nurse Navigator Manus Rudd, MD as Consulting Physician (General Surgery) Rachel Moulds, MD as Consulting Physician (Hematology and Oncology) Dorothy Puffer, MD as Consulting Physician (Radiation Oncology)  CHIEF COMPLAINTS/PURPOSE OF CONSULTATION:  Breast cancer follow-up   HISTORY OF PRESENTING ILLNESS:   [name] 62 y.o. female is here because of recent diagnosis of right breast IDC  I reviewed her records extensively and collaborated the history with the patient.  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 Ultrasound of the right axilla is negative.   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  Report date is November 21, 2021.   The CustomNext-Cancer+RNAinsight panel offered by [employer] includes sequencing and rearrangement analysis for the following 47 genes:  APC, ATM, AXIN2, BARD1, BMPR1A, BRCA1, BRCA2, BRIP1, CDH1, CDK4, CDKN2A, CHEK2, DICER1, EPCAM, GREM1, HOXB13, MEN1, MLH1, MSH2, MSH3, MSH6, MUTYH, NBN, NF1, NF2, NTHL1, PALB2, PMS2, POLD1, POLE, PTEN, RAD51C, RAD51D, RECQL, RET,  SDHA, SDHAF2, SDHB, SDHC, SDHD, SMAD4, SMARCA4, STK11, TP53, TSC1, TSC2, and VHL.  RNA data is routinely analyzed for use in variant interpretation for all genes.   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  Prior prognostic showed ER 0% negative PR 0% negative, HER2 low with IHC of 1+ and Ki-67 of 25%.   12/30/2021 - 03/06/2022 Chemotherapy   Patient is on Treatment Plan : BREAST TC q21d      Interval History  She completed adjuvant radiation on 05/02/2022. Discussed the use of AI scribe software for clinical note transcription with the patient, who gave verbal consent to proceed.  History of Present Illness   The patient, a breast cancer survivor with lymphedema, presents with chest pain. The pain has been ongoing for at least three weeks, leading to multiple ER visits and cessation of exercise. The pain is not associated with exertion and is not relieved by rest. The patient has had a negative cardiac catheterization and stress test. She believes the pain may be due to stress. She also reports shortness of breath, which led to an ER visit after a water aerobics session. The patient manages the pain with occasional Tylenol, as she does not like to take stronger pain medications.  The patient's lymphedema has been managed with a machine, but she has not used it in three weeks due to the chest pain. She plans to resume using it twice a week. She also reports a small knot at the incision site, which is likely scar tissue.  The patient's hair has grown back following chemotherapy, and she believes she  has recovered from the side effects of the treatment. However, she is dealing with stress, including the purchase of a faulty car.      Rest of the pertinent 10 point ROS reviewed and negative  MEDICAL HISTORY:  Past Medical History:  Diagnosis Date   Allergy     Anginal pain (HCC)    a. NL cath in 2008;  b. Myoview 03/2011: dec uptake along mid anterior wall on stress imaging -> ? attenuation vs. ischemia, EF 65%;  c. 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 2008 -> coumadin x 6 mos.   Rheumatoid arthritis (HCC)    Sleep apnea    on CPAP 02/2018   SOB (shortness of breath)    Thyroid disease    TIA (transient ischemic attack)    Urinary urgency    Vitamin D deficiency     SURGICAL HISTORY: Past Surgical History:  Procedure Laterality Date   ABDOMINAL HYSTERECTOMY  2005   APPENDECTOMY     BREAST LUMPECTOMY WITH RADIOACTIVE SEED AND SENTINEL LYMPH NODE BIOPSY Right 11/16/2021   Procedure: RIGHT BREAST RADIOACTIVE SEED LOCALIZED LUMPECTOMY WITH RIGHT AXILLARY SENTINEL LYMPH NODE BIOPSY;  Surgeon: Manus Rudd, MD;  Location: [facility];  Service: General;  Laterality: Right;  LMA & PEC BLOCK   CARDIAC CATHETERIZATION     Normal   CARDIAC CATHETERIZATION N/A 09/30/2014   Procedure: Left Heart Cath and Coronary Angiography;  Surgeon: Tonny Bollman, MD;  Location: [facility] INVASIVE CV LAB;  Service: Cardiovascular;  Laterality: N/A;   LAPAROSCOPIC APPENDECTOMY N/A 06/03/2012   Procedure: APPENDECTOMY LAPAROSCOPIC;  Surgeon: Almond Lint, MD;   Location: [facility] OR;  Service: General;  Laterality: N/A;   Left knee surgery  2008   LEG SURGERY     RIGHT/LEFT HEART CATH AND CORONARY ANGIOGRAPHY N/A 01/10/2023   Procedure: RIGHT/LEFT HEART CATH AND CORONARY ANGIOGRAPHY;  Surgeon: Orbie Pyo, MD;  Location: [facility] INVASIVE CV LAB;  Service: Cardiovascular;  Laterality: N/A;   TONSILLECTOMY     TOTAL HIP ARTHROPLASTY Left 06/11/2018   Procedure: LEFT TOTAL HIP ARTHROPLASTY ANTERIOR APPROACH;  Surgeon: Cammy Copa, MD;  Location: [facility] OR;  Service: Orthopedics;  Laterality: Left;   TOTAL KNEE ARTHROPLASTY Left 08/22/2016   Procedure: TOTAL KNEE ARTHROPLASTY;  Surgeon: Cammy Copa, MD;  Location: [facility] OR;  Service: Orthopedics;  Laterality: Left;   TUBAL LIGATION  1989    SOCIAL HISTORY: Social History   Socioeconomic  History   Marital status: Divorced    Spouse name: Not on file   Number of children: 3   Years of education: Not on file   Highest education level: Not on file  Occupational History   Occupation: UNEMPLOYED    Employer: DISABLED  Tobacco Use   Smoking status: Former    Current packs/day: 0.00    Average packs/day: 0.5 packs/day for 15.0 years (7.5 ttl pk-yrs)    Types: Cigarettes    Start date: 04/24/1970    Quit date: 04/24/1985    Years since quitting: 37.7   Smokeless tobacco: Never  Vaping Use   Vaping status: Never Used  Substance and Sexual Activity   Alcohol use: No    Alcohol/week: 0.0 standard drinks of alcohol   Drug use: Not Currently    Types: Marijuana   Sexual activity: Not Currently    Birth control/protection: Surgical    Comment: hyst  Other Topics Concern   Not on file  Social History Narrative   Lives in [place] by herself.  Disabled.  Occasional Tea .  3 kids, 6 grandkids.     Water aerobics 3 times a week.   Social Determinants of Health   Financial Resource Strain: High Risk (03/27/2022)   Overall Financial Resource Strain (CARDIA)    Difficulty of Paying Living Expenses: Very  hard  Food Insecurity: Food Insecurity Present (03/27/2022)   Hunger Vital Sign    Worried About Running Out of Food in the Last Year: Sometimes true    Ran Out of Food in the Last Year: Sometimes true  Transportation Needs: No Transportation Needs (03/27/2022)   PRAPARE - Administrator, Civil Service (Medical): No    Lack of Transportation (Non-Medical): No  Physical Activity: Not on file  Stress: Not on file  Social Connections: Not on file  Intimate Partner Violence: Not At Risk (03/28/2022)   Humiliation, Afraid, Rape, and Kick questionnaire    Fear of Current or Ex-Partner: No    Emotionally Abused: No    Physically Abused: No    Sexually Abused: No    FAMILY HISTORY: Family History  Problem Relation Age of Onset   Emphysema Mother    Arthritis Mother    Heart failure Mother        alive @ 19   Stroke Mother    Diabetes Mother    Hypertension Mother    Hyperlipidemia Mother    Depression Mother    Anxiety disorder Mother    Heart disease Mother    Alcoholism Mother    Depression Father    Heart disease Father        died @ 19's.   Stroke Father    Diabetes Father    Hyperlipidemia Father    Hypertension Father    Bipolar disorder Father    Sleep apnea Father    Alcoholism Father    Drug abuse Father    Sudden death Father    Diabetes Sister    Asthma Brother    Breast cancer Maternal Aunt 37 - 31   Colon cancer Maternal Grandfather        dx after 50   Heart disease Paternal Grandfather    Breast cancer Cousin        dx 29s   Esophageal cancer Neg Hx    Stomach cancer Neg Hx    Rectal cancer Neg Hx     ALLERGIES:  is allergic to shellfish allergy, hydrocodone-acetaminophen, penicillins, gabapentin,  latex, and sulfa antibiotics.  MEDICATIONS:  Current Outpatient Medications  Medication Sig Dispense Refill   ACCU-CHEK GUIDE test strip      acetaminophen (TYLENOL) 500 MG tablet Take 1,000 mg by mouth 2 (two) times daily as needed for  moderate pain.     albuterol (PROVENTIL HFA;VENTOLIN HFA) 108 (90 Base) MCG/ACT inhaler Inhale 2 puffs into the lungs every 6 (six) hours as needed for wheezing or shortness of breath.     aspirin 81 MG chewable tablet Chew 81 mg by mouth daily.     atorvastatin (LIPITOR) 40 MG tablet Take 40 mg by mouth at bedtime.      BIOTIN PO Take 1 tablet by mouth at bedtime.     busPIRone (BUSPAR) 30 MG tablet Take 30 mg by mouth 2 (two) times daily.     carvedilol (COREG) 6.25 MG tablet Take 1 tablet (6.25 mg total) by mouth 2 (two) times daily. 180 tablet 3   diclofenac Sodium (VOLTAREN) 1 % GEL Apply 2 g topically 4 (four) times daily as needed (joint/muscle pain).     empagliflozin (JARDIANCE) 10 MG TABS tablet Take 1 tablet (10 mg total) by mouth daily. 30 tablet 0   EPINEPHrine 0.3 mg/0.3 mL IJ SOAJ injection Inject 0.3 mg into the muscle as needed for anaphylaxis. Follow package instructions as needed for severe allergy or anaphylactic reaction. 2 each 3   furosemide (LASIX) 40 MG tablet Take 1 tablet (40 mg total) by mouth daily as needed for edema or fluid (take in case of weight gain 2 to 3 lbs in 24 hrs or 5 lbs in 7 days.). (Patient taking differently: Take 20 mg by mouth daily.) 30 tablet 0   hydrOXYzine (VISTARIL) 25 MG capsule Take 25 mg by mouth 3 (three) times daily as needed for itching.     ipratropium-albuterol (DUONEB) 0.5-2.5 (3) MG/3ML SOLN Take 3 mLs by nebulization every 6 (six) hours as needed (shortness of breath).     KLOR-CON M10 10 MEQ tablet Take 10 mEq by mouth 2 (two) times daily.     loperamide (IMODIUM) 2 MG capsule Take 1 capsule (2 mg total) by mouth 4 (four) times daily as needed for diarrhea or loose stools. 12 capsule 0   losartan (COZAAR) 25 MG tablet Take 1 tablet (25 mg total) by mouth daily. 30 tablet 0   metFORMIN (GLUCOPHAGE) 500 MG tablet 1/2 tab daily with breakfast 30 tablet 0   methocarbamol (ROBAXIN) 500 MG tablet Take 500 mg by mouth daily as needed for  muscle spasms.     nitroGLYCERIN (NITROSTAT) 0.3 MG SL tablet Place 0.3 mg under the tongue every 5 (five) minutes as needed for chest pain.     nystatin (MYCOSTATIN/NYSTOP) powder Apply 1 application topically 3 (three) times daily as needed for rash.     ondansetron (ZOFRAN-ODT) 4 MG disintegrating tablet Take 1 tablet (4 mg total) by mouth every 8 (eight) hours as needed for nausea or vomiting. 20 tablet 0   oxyCODONE (OXY IR/ROXICODONE) 5 MG immediate release tablet Take 1 tablet by mouth every 6 (six) hours as needed.     pantoprazole (PROTONIX) 40 MG tablet Take 1 tablet (40 mg total) by mouth 2 (two) times daily. 60 tablet 0   sertraline (ZOLOFT) 25 MG tablet Take 1 tablet (25 mg total) by mouth daily. 90 tablet 0   spironolactone (ALDACTONE) 25 MG tablet Take 1/2 tablet (12.5 mg total) by mouth daily. (Patient taking differently: Take 25 mg by  mouth every other day.) 15 tablet 0   Vitamin D, Ergocalciferol, (DRISDOL) 1.25 MG (50000 UNIT) CAPS capsule Take 1 capsule (50,000 Units total) by mouth every 7 (seven) days. 4 capsule 0   No current facility-administered medications for this visit.    PHYSICAL EXAMINATION: ECOG PERFORMANCE STATUS: 1 - Symptomatic but completely ambulatory  Vitals:   01/17/23 1241  BP: 117/60  Pulse: 75  Resp: 18  Temp: (!) 97.5 F (36.4 C)  SpO2: 98%      Filed Weights   01/17/23 1241  Weight: 201 lb 1.6 oz (91.2 kg)     Physical Exam Constitutional:      Appearance: Normal appearance.  Cardiovascular:     Rate and Rhythm: Normal rate and regular rhythm.  Pulmonary:     Effort: Pulmonary effort is normal.     Breath sounds: Normal breath sounds.  Chest:     Comments: Right breast with some postradiation changes and some evidence of lymphedema. No palpable masses or regional adenopathy Abdominal:     General: Abdomen is flat.     Palpations: Abdomen is soft.  Musculoskeletal:        General: Swelling: Bilateral symmetrical lower  extremity swelling improved/resolved.     Cervical back: Normal range of motion and neck supple. No rigidity.  Lymphadenopathy:     Cervical: No cervical adenopathy.  Skin:    General: Skin is warm and dry.  Neurological:     General: No focal deficit present.     Mental Status: She is alert.      LABORATORY DATA:  I have reviewed the data as listed Lab Results  Component Value Date   WBC 4.6 01/04/2023   HGB 12.6 01/10/2023   HCT 37.0 01/10/2023   MCV 89 01/04/2023   PLT 189 01/04/2023   Lab Results  Component Value Date   NA 140 01/10/2023   K 3.7 01/10/2023   CL 106 01/04/2023   CO2 25 01/04/2023    RADIOGRAPHIC STUDIES: I have personally reviewed the radiological reports and agreed with the findings in the report.  ASSESSMENT AND PLAN:   Malignant neoplasm of upper-outer quadrant of female breast [facility]) This is a very pleasant 62 year old female patient with past medical history significant for congestive heart failure, coronary artery disease, diabetes, hypertension, dyslipidemia history of pulmonary embolism back in 2008 for which she needed anticoagulation, rheumatoid arthritis, obstructive sleep apnea who is newly diagnosed with right breast IDC noticed on screening mammogram.    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  She is now s/p 4 cycles of TC and adjuvant radiation. No role for anti estrogen therapy since she had triple neg breast cancer.      Breast Pain and Lymphedema Improvement in breast pain. Lymphedema management paused due to chest pain concerns. No new breast masses on exam. -Resume lymphedema management with machine twice weekly. -Continue monitoring breast pain and lymphedema symptoms.  Chest Pain Recent episodes of chest pain leading to ER visits. Cardiac catheterization and stress test were normal. Patient suspects stress as a possible cause. -Encourage stress management  techniques. -Continue monitoring chest pain symptoms.  General Health Maintenance Last mammogram was in April 2024. -Schedule next mammogram for April 2025.  Follow-up in six months.        Total time spent: 30 minutes including history, physical exam, review of records, counseling and coordination of care All questions were answered. The patient knows to  call the clinic with any problems, questions or concerns.    Rachel Moulds, MD 01/17/23

## 2023-01-17 NOTE — Progress Notes (Signed)
[facility] Spiritual Care Note  Missed [name] when she stopped by [facility], so left voicemail with direct number, encouraging return call.   [address] [name], [place], [facility] Pager [phone] Voicemail [phone]

## 2023-02-05 NOTE — Progress Notes (Signed)
[facility] PREP Weekly Session  Patient Details  Name: FAMA MUENCHOW MRN: [id number] Date of Birth: March 11, 1961 Age: 62 y.o. PCP: Raymon Mutton., FNP  Vitals:   02/05/23 1030  Weight: 210 lb (95.3 kg)     [facility] Weekly seesion - 02/05/23 1400       [facility] "PREP" Location   [facility]" [facility] [employer]      Weekly Session   Topic Discussed Finding support    Minutes exercised this week 410 minutes    Classes attended to date 12             Bonnye Fava 02/05/2023, 2:42 PM

## 2023-02-07 ENCOUNTER — Encounter (INDEPENDENT_AMBULATORY_CARE_PROVIDER_SITE_OTHER): Payer: Self-pay | Admitting: Adult Health

## 2023-02-07 ENCOUNTER — Ambulatory Visit (INDEPENDENT_AMBULATORY_CARE_PROVIDER_SITE_OTHER): Payer: 59 | Admitting: Adult Health

## 2023-02-07 VITALS — BP 113/68 | HR 66 | Temp 97.9°F | Ht 59.0 in | Wt 206.0 lb

## 2023-02-07 DIAGNOSIS — Z7984 Long term (current) use of oral hypoglycemic drugs: Secondary | ICD-10-CM

## 2023-02-07 DIAGNOSIS — E559 Vitamin D deficiency, unspecified: Secondary | ICD-10-CM

## 2023-02-07 DIAGNOSIS — Z6841 Body Mass Index (BMI) 40.0 and over, adult: Secondary | ICD-10-CM

## 2023-02-07 DIAGNOSIS — E669 Obesity, unspecified: Secondary | ICD-10-CM

## 2023-02-07 DIAGNOSIS — E1165 Type 2 diabetes mellitus with hyperglycemia: Secondary | ICD-10-CM | POA: Diagnosis not present

## 2023-02-07 MED ORDER — VITAMIN D (ERGOCALCIFEROL) 1.25 MG (50000 UNIT) PO CAPS
50000.0000 [IU] | ORAL_CAPSULE | ORAL | 0 refills | Status: DC
Start: 2023-02-07 — End: 2023-03-07

## 2023-02-07 MED ORDER — METFORMIN HCL 500 MG PO TABS: ORAL_TABLET | ORAL | 0 refills | Status: DC

## 2023-02-07 NOTE — Progress Notes (Signed)
WEIGHT SUMMARY AND BIOMETRICS  Vitals Temp: 97.9 F (36.6 C) BP: 113/68 Pulse Rate: 66 SpO2: 94 %   Anthropometric Measurements Height: 4\' 11"  (1.499 m) Weight: 206 lb (93.4 kg) BMI (Calculated): 41.58 Weight at Last Visit: 207lb Weight Lost Since Last Visit: 1lb Weight Gained Since Last Visit: 0 Starting Weight: 209lb Total Weight Loss (lbs): 3 lb (1.361 kg)   Body Composition  Body Fat %: 48.9 % Fat Mass (lbs): 101 lbs Muscle Mass (lbs): 100 lbs Total Body Water (lbs): 75.6 lbs Visceral Fat Rating : 16   Other Clinical Data Fasting: no Labs: no Today's Visit #: 17 Starting Date: 06/01/21    Chief Complaint:   OBESITY [name] is here to discuss her progress with her obesity treatment plan. She is on the the Category 2 Plan and states she is following her eating plan approximately 70 % of the time. She states she is exercising Water Aerobics and [employer] [facility] 45 minutes 3/2 times per week.   Interim History:  In the last several weeks- [name] reports celebrating two family birthdays and her neighbors graduation. She endorses indulging in "party food"  She restarted low dose Metformin 500mg  1/2 tab daily on/about 01/11/2023 She denies current GI upset  Subjective:   1. Vitamin D deficiency Discussed Labs  Latest Reference Range & Units 01/11/23 10:17  Vitamin D, 25-Hydroxy 30.0 - 100.0 ng/mL 33.5    Level below goal of 50-70 She is on weekly Ergocalciferol-denies N/V/Muscle Weakness  2. Type 2 diabetes mellitus with hyperglycemia, without long-term current use of insulin (HCC) Discussed Labs Lab Results  Component Value Date   HGBA1C 7.9 (H) 12/11/2022   HGBA1C 7.3 (H) 07/15/2022   HGBA1C 6.4 (H) 09/28/2021     01/11/2023 restarted on Metformin 500mg  1/2 tab daily with meal. She remained on Jardiance 10mg  daily. She denies GI upset at present, but is concerned that she will develop GI upset again if titration is too quick.  Fasting  CBG range from 120-140s  Of note- Ozempic caused GI upset and weight gain.  Assessment/Plan:   1. Vitamin D deficiency Refill - Vitamin D, Ergocalciferol, (DRISDOL) 1.25 MG (50000 UNIT) CAPS capsule; Take 1 capsule (50,000 Units total) by mouth every 7 (seven) days.  Dispense: 4 capsule; Refill: 0  2. Type 2 diabetes mellitus with hyperglycemia, without long-term current use of insulin (HCC) Refill - metFORMIN (GLUCOPHAGE) 500 MG tablet; 1/2 tab daily with breakfast  Dispense: 30 tablet; Refill: 0  3. Obesity, current BMI of 41.58  [name] is currently in the action stage of change. As such, her goal is to continue with weight loss efforts. She has agreed to the Category 2 Plan.   Exercise goals: All adults should avoid inactivity. Some physical activity is better than none, and adults who participate in any amount of physical activity gain some health benefits. Adults should also include muscle-strengthening activities that involve all major muscle groups on 2 or more days a week.  Behavioral modification strategies: increasing lean protein intake, decreasing simple carbohydrates, increasing vegetables, increasing water intake, meal planning and cooking strategies, keeping healthy foods in the home, ways to avoid boredom eating, and planning for success.  [name] has agreed to follow-up with our clinic in 4 weeks. She was informed of the importance of frequent follow-up visits to maximize her success with intensive lifestyle modifications for her multiple health conditions.   Objective:   Blood pressure 113/68, pulse 66, temperature 97.9 F (36.6 C), height  4\' 11"  (1.499 m), weight 206 lb (93.4 kg), SpO2 94%. Body mass index is 41.61 kg/m.  General: Cooperative, alert, well developed, in no acute distress. HEENT: Conjunctivae and lids unremarkable. Cardiovascular: Regular rhythm.  Lungs: Normal work of breathing. Neurologic: No focal deficits.   Lab Results  Component Value Date    CREATININE 0.97 01/04/2023   BUN 20 01/04/2023   NA 140 01/10/2023   K 3.7 01/10/2023   CL 106 01/04/2023   CO2 25 01/04/2023   Lab Results  Component Value Date   ALT 24 10/15/2022   AST 19 10/15/2022   ALKPHOS 58 10/15/2022   BILITOT 0.6 10/15/2022   Lab Results  Component Value Date   HGBA1C 7.9 (H) 12/11/2022   HGBA1C 7.3 (H) 07/15/2022   HGBA1C 6.4 (H) 09/28/2021   HGBA1C 8.4 (H) 03/08/2021   HGBA1C 7.6 (H) 02/03/2020   Lab Results  Component Value Date   INSULIN 13.4 08/14/2022   INSULIN 22.1 09/28/2021   INSULIN 26.8 (H) 06/01/2021   INSULIN 44.4 (H) 02/03/2020   INSULIN 28.1 (H) 12/26/2018   Lab Results  Component Value Date   TSH 0.746 11/07/2022   Lab Results  Component Value Date   CHOL 127 07/16/2022   HDL 48 07/16/2022   LDLCALC 55 07/16/2022   TRIG 119 07/16/2022   CHOLHDL 2.6 07/16/2022   Lab Results  Component Value Date   VD25OH 33.5 01/11/2023   VD25OH 30.3 08/14/2022   VD25OH 39.8 09/28/2021   Lab Results  Component Value Date   WBC 4.6 01/04/2023   HGB 12.6 01/10/2023   HCT 37.0 01/10/2023   MCV 89 01/04/2023   PLT 189 01/04/2023   No results found for: "IRON", "TIBC", "FERRITIN"  Attestation Statements:   Reviewed by clinician on day of visit: allergies, medications, problem list, medical history, surgical history, family history, social history, and previous encounter notes.  I have reviewed the above documentation for accuracy and completeness, and I agree with the above. -  Eldonna Neuenfeldt d. Pape Parson, NP-C

## 2023-02-09 ENCOUNTER — Telehealth: Payer: Self-pay

## 2023-02-09 NOTE — Telephone Encounter (Signed)
Returned pt phone call. Pt had questions about Flexi touch. She had several occasions about a month ago where she felt it brought fluid to her left chest and it remained there. She also felt pressure in her chest several times.. Advised that this would not be normal. She has seen her cardiologist and has had heart cath with no blockages. She has not been using Flexi daily but more like 2-3 days per week. She has not used it now for several weeks. She did speak with [employer] and they told her she should use it daily. She is going to try it again and if she notices chest pressure she will Discontinue. If she feels fluid is remaining on the left she will come in to see me to be checked and we will contact [employer] to check her machine.  She feels her breast is doing quite well overall and has reduced. She is wearing her compression bra only at night because that is what [employer] told her to do. I advised to wear during the day as well for swelling. She will contact me again with any concerns.

## 2023-02-12 NOTE — Progress Notes (Deleted)
Office Visit    Patient Name: [name] Date of Encounter: 02/12/2023  PCP:  Raymon Mutton., FNP   [facility]  Cardiologist:  Charlton Haws, MD  Advanced Practice Provider:  No care team member to display Electrophysiologist:  None   HPI    [name] is a 62 y.o. female with past medical history significant for breast cancer (newly diagnosed), bone cancer, HFpEF, OSA, remote history of PE no longer on OAC, TAA, HTN, atypical chest pain and HLD presents today for hospital follow-up appointment.  She was originally seen by Dr. [place] in 2008 and underwent normal LHC with normal LV function.  She was treated with Coumadin in 04/2006 for questionable PE.  Cardiac CTA was completed 01/2010 which showed no PE.  She was seen in the ED 2012 for complaints of chest pain.  She underwent nuclear stress test with Myoview that was found to be low risk, chest pain was likely ruled musculoskeletal etiology.  Patient has subsequent repeat Myoview for chest pain in 10/2012 which was normal.  She was hospitalized 05/2014 with chest pain and left-sided numbness and MRI was completed to rule out stroke which was negative.  She was seen in the ER for chest pain 09/03/2020.  Ruled out with negative tropes.  D-dimer was elevated.  CTA negative for PE.  She was seen in the ED for complaint of chest pain in 02/2021.  Patient reported throbbing dull pain in the center of her chest with associated shortness of breath.  CT was completed that was negative for PE but did reveal possible worsening pericardial effusion.  2D echocardiogram was ordered which showed EF 50 to 55%, no RWMA, mild LVH, grade 2 DD, and normal valve function.  Her troponins were negative x 2 and EKG had nonspecific ST changes.  Patient had relief with Nitropaste and was referred to GI for possible upper endoscopy.  She was seen in the ED 11/2020 with complaint of chest pain or shortness of breath thought to be  possible CHF exacerbation.  BNP was normal and troponins were negative.  The patient was last seen 12/12/2021 and had just been in the ED for possible CHF exacerbation.  She reported not feeling well and denied any current chest discomfort on follow-up.  She has been euvolemic on exam with been compliant with all of her current medications.  Blood pressure 108/70 and reported that they have been on the low side for a few days.  11/30-12/5 She was admitted and started on spiro, jardiance, and losartan. Echo reviewed with normal diastolic and systolic function. She has sciatica which keeps her from doing any significant walking. She tells me that after her last chemo treatment was when the swelling in her legs started. Seen in follow-up 12/19 by myself and  she does has some swelling and left arm pain. She tells me she has gained about 7 lbs since she was in the hospital. No chest pain.    She was seen by me 04/2022, she shares her SOB and lower ext swelling is much better. She has not had any chest pain or palpitations. Her cancer treatments are over and now she has noticed less swelling. She did call the cancer center Friday since she has some radiation burn under her right breast and needed some cream. She ended up using an OTC burn cream which has helped. She does not feel like her concerns are being addressed by the cancer center.  Otherwise, doing well from a CV standpoint.  Recently in the ED  for atypical chest pain.  Troponins were negative and concern for ACS was low.  Nonexertional.  It was associated with diaphoresis and vomiting.  History of PE not currently on any blood thinners.  Pain was improving.  CT was negative for PE.  Plan for follow-up with cardiology with no additional testing.  I saw her 11/07/2022, she says her chest pain was like a sharp pain and then dull. Nothing that she did would make it go away. Left side and then to her back. Was exertional, got worse when she was walking, and  then resolved with rest. Sometimes positional. We discussed updating an  echo too r/o pericarditis. She explained it as she was putting on her clothes and it "caught her" and then had some nasuea and sweating, throwing up. She had not eaten anything that morning when she went to church. She has not used her nitroglycerin at all. She does endorse headaches at times. Occassional palpitations but do not last long.  She underwent a right and left heart catheterization September 2024 and was diagnosed with nonobstructive CAD.  Her coronary disease is relatively unchanged compared to her angiography in 2016, medical therapy is recommended.  Today, she***  Past Medical History    Past Medical History:  Diagnosis Date   Allergy    Anginal pain (HCC)    a. NL cath in 2008;  b. Myoview 03/2011: dec uptake along mid anterior wall on stress imaging -> ? attenuation vs. ischemia, EF 65%;  c. 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 2008 -> coumadin x 6 mos.   Rheumatoid arthritis (HCC)    Sleep apnea    on CPAP 02/2018   SOB (shortness of breath)    Thyroid disease    TIA (transient ischemic attack)    Urinary urgency    Vitamin D deficiency    Past Surgical History:  Procedure Laterality Date   ABDOMINAL  HYSTERECTOMY  2005   APPENDECTOMY     BREAST LUMPECTOMY WITH RADIOACTIVE SEED AND SENTINEL LYMPH NODE BIOPSY Right 11/16/2021   Procedure: RIGHT BREAST RADIOACTIVE SEED LOCALIZED LUMPECTOMY WITH RIGHT AXILLARY SENTINEL LYMPH NODE BIOPSY;  Surgeon: Manus Rudd, MD;  Location: [facility];  Service: General;  Laterality: Right;  LMA & PEC BLOCK   CARDIAC CATHETERIZATION     Normal   CARDIAC CATHETERIZATION N/A 09/30/2014   Procedure:  Left Heart Cath and Coronary Angiography;  Surgeon: Tonny Bollman, MD;  Location: [facility] INVASIVE CV LAB;  Service: Cardiovascular;  Laterality: N/A;   LAPAROSCOPIC APPENDECTOMY N/A 06/03/2012   Procedure: APPENDECTOMY LAPAROSCOPIC;  Surgeon: Almond Lint, MD;  Location: [facility] OR;  Service: General;  Laterality: N/A;   Left knee surgery  2008   LEG SURGERY     RIGHT/LEFT HEART CATH AND CORONARY ANGIOGRAPHY N/A 01/10/2023   Procedure: RIGHT/LEFT HEART CATH AND CORONARY ANGIOGRAPHY;  Surgeon: Orbie Pyo, MD;  Location: [facility] INVASIVE CV LAB;  Service: Cardiovascular;  Laterality: N/A;   TONSILLECTOMY     TOTAL HIP ARTHROPLASTY Left 06/11/2018   Procedure: LEFT TOTAL HIP ARTHROPLASTY ANTERIOR APPROACH;  Surgeon: Cammy Copa, MD;  Location[facility] OR;  Service: Orthopedics;  Laterality: Left;   TOTAL KNEE ARTHROPLASTY Left 08/22/2016   Procedure: TOTAL KNEE ARTHROPLASTY;  Surgeon: Cammy Copa, MD;  Location: [facility] OR;  Service: Orthopedics;  Laterality: Left;   TUBAL LIGATION  1989    Allergies  Allergies  Allergen Reactions   Shellfish Allergy Hives and Anaphylaxis   Hydrocodone-Acetaminophen Itching and Other (See Comments)    confusion Mental Status Changes   Penicillins Swelling and Other (See Comments)   Gabapentin     Not available   Latex Rash   Sulfa Antibiotics Diarrhea, Itching and Rash   EKGs/Labs/Other Studies Reviewed:   The following studies were reviewed today:  Coronary catheterization 01/10/2023  Diagnostic Dominance:  Left Left Anterior Descending  Mid LAD lesion is 25% stenosed.    Right Coronary Artery  Vessel is small.    Intervention   No interventions have been documented.   Coronary Diagrams  Diagnostic Dominance: Left  Intervention   03/24/2022 echocardiogram  IMPRESSIONS     1. Left ventricular ejection fraction, by estimation, is 60 to 65%. The  left ventricle has normal function. The left ventricle has no regional  wall motion abnormalities. Left ventricular diastolic parameters were  normal.   2. Right ventricular systolic function is normal. The right ventricular  size is normal.   3. The mitral valve is normal in structure. Trivial mitral valve  regurgitation. No evidence of mitral stenosis.   4. The aortic valve is tricuspid. Aortic valve regurgitation is trivial.  No aortic stenosis is present.   5. The inferior vena cava is dilated in size with <50% respiratory  variability, suggesting right atrial pressure of 15 mmHg.   FINDINGS   Left Ventricle: Left ventricular ejection fraction, by estimation, is 60  to 65%. The left ventricle has normal function. The left ventricle has no  regional wall motion abnormalities. The left ventricular internal cavity  size was normal in size. There is   no left ventricular hypertrophy. Left ventricular diastolic parameters  were normal.   Right Ventricle: The right ventricular size is normal. Right ventricular  systolic function is normal.   Left Atrium: Left atrial size was normal in size.   Right Atrium: Right atrial size was normal in size.   Pericardium: There is no evidence of pericardial effusion.   Mitral Valve: The mitral valve is normal in structure. Trivial mitral  valve regurgitation. No evidence of mitral valve stenosis.   Tricuspid Valve: The tricuspid valve is normal in structure. Tricuspid  valve regurgitation is trivial. No evidence of tricuspid stenosis.   Aortic Valve: The aortic valve is tricuspid. Aortic  valve regurgitation is  trivial. No aortic stenosis is present.   Pulmonic Valve: The pulmonic valve was normal  in structure. Pulmonic valve  regurgitation is mild. No evidence of pulmonic stenosis.   Aorta: The aortic root is normal in size and structure.   Venous: The inferior vena cava is dilated in size with less than 50%  respiratory variability, suggesting right atrial pressure of 15 mmHg.   IAS/Shunts: No atrial level shunt detected by color flow Doppler.   EKG:  EKG is  ordered today.  The ekg ordered today demonstrates NSR with no ST elevation  Recent Labs: 04/11/2022: NT-Pro BNP 54 07/16/2022: Magnesium 1.9 10/15/2022: ALT 24 11/07/2022: TSH 0.746 12/14/2022: B Natriuretic Peptide 31.4 01/04/2023: BUN 20; Creatinine, Ser 0.97; Platelets 189 01/10/2023: Hemoglobin 12.6; Potassium 3.7; Sodium 140  Recent Lipid Panel    Component Value Date/Time   CHOL 127 07/16/2022 0145   CHOL 127 09/28/2021 0951   TRIG 119 07/16/2022 0145   HDL 48 07/16/2022 0145   HDL 48 09/28/2021 0951   CHOLHDL 2.6 07/16/2022 0145   VLDL 24 07/16/2022 0145   LDLCALC 55 07/16/2022 0145   LDLCALC 60 09/28/2021 0951    Home Medications   No outpatient medications have been marked as taking for the 02/13/23 encounter (Appointment) with Sharlene Dory, PA-C.     Review of Systems      All other systems reviewed and are otherwise negative except as noted above.  Physical Exam    VS:  There were no vitals taken for this visit. , BMI There is no height or weight on file to calculate BMI.  Wt Readings from Last 3 Encounters:  02/07/23 206 lb (93.4 kg)  02/05/23 210 lb (95.3 kg)  01/17/23 201 lb 1.6 oz (91.2 kg)     GEN: Well nourished, well developed, in no acute distress. HEENT: normal. Neck: Supple, no JVD, carotid bruits, or masses. Cardiac: RRR, no murmurs, rubs, or gallops. No clubbing, cyanosis, 1+ pitting edema bilaterally.  Radials/PT 2+ and equal bilaterally.  Respiratory:   Respirations regular and unlabored, clear to auscultation bilaterally. GI: Soft, nontender, nondistended. MS: No deformity or atrophy. Skin: Warm and dry, no rash. Neuro:  Strength and sensation are intact. Psych: Normal affect.  Assessment & Plan    HFpEF -euvolemic on exam today -low sodium diet -lower ext compression -daily weights and if increases by 2 lbs overnight or 5 lbs in a week we need a phone call -she had her last treatment (radiation for breast cancer) earlier this months  Stable angina -nitro as needed (she has not needed) -recent ER workup neg for ischemic pain -will update echo  Shortness of breath -euvolemic on exam today -Much improved today -she has PRN lasix   HLD -LDL 60, at goal < 70 -continue Lipitor 40mg  daily -repeat lipid panel 09/2022  HTN -well controlled today -continue current medications  No BP recorded.  {Refresh Note OR Click here to enter BP  :1}***   Disposition: Follow up 3 month with Charlton Haws, MD or APP.  Signed, Sharlene Dory, PA-C 02/12/2023, 8:43 PM [facility]

## 2023-02-13 ENCOUNTER — Ambulatory Visit: Payer: 59 | Attending: Physician Assistant | Admitting: Physician Assistant

## 2023-02-13 DIAGNOSIS — E785 Hyperlipidemia, unspecified: Secondary | ICD-10-CM

## 2023-02-13 DIAGNOSIS — R0602 Shortness of breath: Secondary | ICD-10-CM

## 2023-02-13 DIAGNOSIS — I2089 Other forms of angina pectoris: Secondary | ICD-10-CM

## 2023-02-13 DIAGNOSIS — I10 Essential (primary) hypertension: Secondary | ICD-10-CM

## 2023-02-13 DIAGNOSIS — I5030 Unspecified diastolic (congestive) heart failure: Secondary | ICD-10-CM

## 2023-02-13 NOTE — Progress Notes (Signed)
[employer] [facility] Weekly Session  Patient Details  Name: [name] MRN: [id number] Date of Birth: Sep 10, 1960 Age: 62 y.o. PCP: Raymon Mutton., FNP  There were no vitals filed for this visit.   [employer] Weekly seesion - 02/13/23 1200       [facility] "PREP" Location   [facility] [facility] [employer]      Weekly Session   Topic Discussed Calorie breakdown    Minutes exercised this week 590 minutes    Classes attended to date 14             Bonnye Fava 02/13/2023, 12:27 PM

## 2023-02-14 ENCOUNTER — Encounter: Payer: Self-pay | Admitting: Physician Assistant

## 2023-02-15 ENCOUNTER — Other Ambulatory Visit: Payer: Self-pay

## 2023-02-15 ENCOUNTER — Encounter (HOSPITAL_COMMUNITY): Payer: Self-pay

## 2023-02-15 ENCOUNTER — Emergency Department (HOSPITAL_COMMUNITY): Payer: 59

## 2023-02-15 ENCOUNTER — Emergency Department (HOSPITAL_COMMUNITY)
Admission: EM | Admit: 2023-02-15 | Discharge: 2023-02-15 | Disposition: A | Discharge status: Home / Self Care | Payer: 59 | Attending: Emergency Medicine | Admitting: Emergency Medicine

## 2023-02-15 DIAGNOSIS — Z7982 Long term (current) use of aspirin: Secondary | ICD-10-CM | POA: Insufficient documentation

## 2023-02-15 DIAGNOSIS — Z79899 Other long term (current) drug therapy: Secondary | ICD-10-CM | POA: Insufficient documentation

## 2023-02-15 DIAGNOSIS — Z85830 Personal history of malignant neoplasm of bone: Secondary | ICD-10-CM | POA: Insufficient documentation

## 2023-02-15 DIAGNOSIS — I2510 Atherosclerotic heart disease of native coronary artery without angina pectoris: Secondary | ICD-10-CM | POA: Diagnosis not present

## 2023-02-15 DIAGNOSIS — Z87891 Personal history of nicotine dependence: Secondary | ICD-10-CM | POA: Diagnosis not present

## 2023-02-15 DIAGNOSIS — R079 Chest pain, unspecified: Secondary | ICD-10-CM

## 2023-02-15 DIAGNOSIS — I110 Hypertensive heart disease with heart failure: Secondary | ICD-10-CM | POA: Insufficient documentation

## 2023-02-15 DIAGNOSIS — M7989 Other specified soft tissue disorders: Secondary | ICD-10-CM | POA: Diagnosis not present

## 2023-02-15 DIAGNOSIS — I509 Heart failure, unspecified: Secondary | ICD-10-CM | POA: Diagnosis not present

## 2023-02-15 DIAGNOSIS — Z7984 Long term (current) use of oral hypoglycemic drugs: Secondary | ICD-10-CM | POA: Diagnosis not present

## 2023-02-15 DIAGNOSIS — Z91040 Latex allergy status: Secondary | ICD-10-CM | POA: Diagnosis not present

## 2023-02-15 DIAGNOSIS — J45909 Unspecified asthma, uncomplicated: Secondary | ICD-10-CM | POA: Diagnosis not present

## 2023-02-15 DIAGNOSIS — R0789 Other chest pain: Secondary | ICD-10-CM | POA: Insufficient documentation

## 2023-02-15 DIAGNOSIS — Z1152 Encounter for screening for COVID-19: Secondary | ICD-10-CM | POA: Diagnosis not present

## 2023-02-15 DIAGNOSIS — E1165 Type 2 diabetes mellitus with hyperglycemia: Secondary | ICD-10-CM | POA: Insufficient documentation

## 2023-02-15 DIAGNOSIS — M25561 Pain in right knee: Secondary | ICD-10-CM | POA: Insufficient documentation

## 2023-02-15 LAB — RESP PANEL BY RT-PCR (RSV, FLU A&B, COVID)  RVPGX2
Influenza A by PCR: NEGATIVE
Influenza B by PCR: NEGATIVE
Resp Syncytial Virus by PCR: NEGATIVE
SARS Coronavirus 2 by RT PCR: NEGATIVE

## 2023-02-15 LAB — CBC
HCT: 40.1 % (ref 36.0–46.0)
Hemoglobin: 13.4 g/dL (ref 12.0–15.0)
MCH: 30.4 pg (ref 26.0–34.0)
MCHC: 33.4 g/dL (ref 30.0–36.0)
MCV: 90.9 fL (ref 80.0–100.0)
Platelets: 236 10*3/uL (ref 150–400)
RBC: 4.41 MIL/uL (ref 3.87–5.11)
RDW: 13.4 % (ref 11.5–15.5)
WBC: 5.4 10*3/uL (ref 4.0–10.5)
nRBC: 0 % (ref 0.0–0.2)

## 2023-02-15 LAB — BASIC METABOLIC PANEL
Anion gap: 10 (ref 5–15)
BUN: 15 mg/dL (ref 8–23)
CO2: 23 mmol/L (ref 22–32)
Calcium: 9.3 mg/dL (ref 8.9–10.3)
Chloride: 108 mmol/L (ref 98–111)
Creatinine, Ser: 0.8 mg/dL (ref 0.44–1.00)
GFR, Estimated: >60 mL/min (ref >60)
Glucose, Bld: 87 mg/dL (ref 70–99)
Potassium: 3.7 mmol/L (ref 3.5–5.1)
Sodium: 141 mmol/L (ref 135–145)

## 2023-02-15 LAB — TROPONIN I (HIGH SENSITIVITY)
Troponin I (High Sensitivity): 8 ng/L (ref <18)
Troponin I (High Sensitivity): 7 ng/L (ref <18)

## 2023-02-15 LAB — BRAIN NATRIURETIC PEPTIDE: B Natriuretic Peptide: 17 pg/mL (ref 0.0–100.0)

## 2023-02-15 MED ORDER — LIDOCAINE 5 % EX PTCH
1.0000 | MEDICATED_PATCH | Freq: Once | CUTANEOUS | Status: DC
  Administered 2023-02-15: 1 via TRANSDERMAL
  Filled 2023-02-15: qty 1

## 2023-02-15 MED ORDER — CYCLOBENZAPRINE HCL 10 MG PO TABS: 10.0000 mg | ORAL_TABLET | Freq: Two times a day (BID) | ORAL | 0 refills | Status: AC | PRN

## 2023-02-15 MED ORDER — KETOROLAC TROMETHAMINE 15 MG/ML IJ SOLN
15.0000 mg | Freq: Once | INTRAMUSCULAR | Status: AC
  Administered 2023-02-15: 15 mg via INTRAVENOUS
  Filled 2023-02-15: qty 1

## 2023-02-15 MED ORDER — PANTOPRAZOLE SODIUM 20 MG PO TBEC: 20.0000 mg | DELAYED_RELEASE_TABLET | Freq: Every day | ORAL | 0 refills | Status: DC

## 2023-02-15 MED ORDER — IOHEXOL 350 MG/ML SOLN
65.0000 mL | Freq: Once | INTRAVENOUS | Status: AC | PRN
  Administered 2023-02-15: 65 mL via INTRAVENOUS

## 2023-02-15 NOTE — Progress Notes (Signed)
VASCULAR LAB    Right lower extremity venous duplex has been performed.  See CV proc for preliminary results.  Gave verbal report to Arthor Captain, PA-C  Gesselle Fitzsimons, RVT 02/15/2023, 4:50 PM

## 2023-02-15 NOTE — Discharge Instructions (Signed)
Return to the Emergency Department if you have unusual chest pain, pressure, or discomfort, shortness of breath, nausea, vomiting, burping, heartburn, tingling upper body parts, sweating, cold, clammy skin, or racing heartbeat. Call 911 if you think you are having a heart attack. Take all cardiac medications as prescribed - notify your doctor if you have any side effects. Follow cardiac diet - avoid fatty & fried foods, don't eat too much red meat, eat lots of fruits & vegetables, and dairy products should be low fat. Please lose weight if you are overweight. Become more active with walking, gardening, or any other activity that gets you to moving.   Please return to the emergency department immediately for any new or concerning symptoms, or if you get worse. 

## 2023-02-15 NOTE — ED Triage Notes (Signed)
Pt BIB EMS due to substernal non radiating chest pain that started this morning. No apparent distress at this time. Axox4. EMS gave 324mg  of aspirin.

## 2023-02-15 NOTE — ED Provider Notes (Signed)
[place] EMERGENCY DEPARTMENT AT [facility] Provider Note  CSN: [id number] Arrival date & time: 02/15/23 1436  Chief Complaint(s) Chest Pain  HPI [name] is a 62 y.o. female with past medical history as below, significant for angina, HFpEF, prior PE no longer on [facility], clotting disorder, IBS, joint pain, depression who presents to the ED with complaint of chest / arm pain. Right knee pain.  She had RL HC 9/18, Dr Lynnette Caffey, Mid LAD 25% stenosed, relatively nonobstructive CAD  She presents today with 1 day of chest pressure, midsternal, some radiation to left shoulder.  Has pain behind her right knee.  Headache, nausea, body aches.  Reports similar symptoms in the past that she reports were attributed to her CHF.  She has been compliant with her home medications, no unexpected weight changes, no fevers or chills, chest pain is constant, pressure sensation.  Unable to identify alleviating chest pain factors.  Change in bowel or bladder functions   Past Medical History Past Medical History:  Diagnosis Date   Allergy    Anginal pain (HCC)    a. NL cath in 2008;  b. Myoview 03/2011: dec uptake along mid anterior wall on stress imaging -> ? attenuation vs. ischemia, EF 65%;  c. 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 2008 -> coumadin x 6 mos.   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   Back pain 06/01/2021   Right thyroid nodule 05/31/2021   At risk for impaired metabolic function 04/12/2020   Unilateral primary osteoarthritis, left hip    Hip arthritis 06/11/2018   Class 3 severe obesity with serious comorbidity and body mass index (BMI) of 40.0 to 44.9 in adult [facility]) 05/29/2018   Other fatigue 11/20/2017   Shortness of breath on exertion 11/20/2017   Type 2 diabetes mellitus with hyperglycemia, without long-term  current use of insulin (HCC) 11/20/2017   Vitamin D deficiency 11/20/2017   Depression with anxiety 11/20/2017   Other hyperlipidemia 11/20/2017   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/04/2016   Presence of left artificial knee joint 09/20/2016   Arthritis of knee 08/22/2016   Cervical radiculopathy 07/26/2016   Cervical disc disorder with radiculopathy 07/26/2016   Right arm pain 06/29/2016   Cervicalgia 06/29/2016   Primary osteoarthritis of left knee 06/29/2016   Hypersomnia with sleep apnea 11/18/2015   Lethargy 11/18/2015   Exertional chest pain 09/30/2014   Abnormal cardiac function test 09/29/2014   Chest pain with moderate risk for cardiac etiology 09/28/2014   HTN (hypertension) 09/28/2014   AKI (acute kidney injury) (HCC)    Paresthesia 06/18/2014   Numbness and tingling of left arm and leg    Unstable angina (HCC)  05/24/2014   OSA on CPAP 05/24/2014   CAD (coronary artery disease) 11/25/2012   S/P laparoscopic appendectomy 06/04/2012   Chronic diastolic congestive heart failure (HCC) 01/09/2012   Mediastinal mass 01/09/2012   SOB (shortness of breath) 06/20/2011   Mediastinal abnormality 06/20/2011   Dyslipidemia 12/23/2009   GLAUCOMA 12/23/2009   ARTHRITIS 12/23/2009   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 Authorizing Provider  cyclobenzaprine (FLEXERIL) 10 MG tablet Take 1 tablet (10 mg total) by mouth 2 (two) times daily as needed for muscle spasms. 02/15/23  Yes Tanda Rockers A, DO  pantoprazole (PROTONIX) 20 MG tablet Take 1 tablet (20 mg total) by mouth daily for 14 days. 02/15/23 03/01/23 Yes Sloan Leiter, DO  ACCU-CHEK GUIDE test strip  10/28/21   [provider]  acetaminophen (TYLENOL) 500 MG tablet Take 1,000 mg by mouth 2 (two) times daily as needed for moderate pain.    [provider]  albuterol (PROVENTIL HFA;VENTOLIN HFA) 108 (90 Base) MCG/ACT inhaler Inhale 2 puffs into the lungs every 6 (six) hours as needed for wheezing or shortness of breath.    [provider]  aspirin 81 MG chewable tablet Chew 81 mg by mouth daily.    [provider]  atorvastatin (LIPITOR) 40 MG tablet Take 40 mg by mouth at bedtime.     [provider]  BIOTIN PO Take 1 tablet by mouth at bedtime.    [provider]  busPIRone (BUSPAR) 30 MG tablet Take  30 mg by mouth 2 (two) times daily. 12/18/19   [provider]  carvedilol (COREG) 6.25 MG tablet Take 1 tablet (6.25 mg total) by mouth 2 (two) times daily. 09/06/21   Wendall Stade, MD  diclofenac Sodium (VOLTAREN) 1 % GEL Apply 2 g topically 4 (four) times daily as needed (joint/muscle pain). 09/20/19   [provider]  empagliflozin (JARDIANCE) 10 MG TABS tablet Take 1 tablet (10 mg total) by mouth daily. 03/28/22   Arrien, York Ram, MD  EPINEPHrine 0.3 mg/0.3 mL IJ SOAJ injection Inject 0.3 mg into the muscle as needed for anaphylaxis. Follow package instructions as needed for severe allergy or  anaphylactic reaction. 11/06/21   Sharman Cheek, MD  furosemide (LASIX) 40 MG tablet Take 1 tablet (40 mg total) by mouth daily as needed for edema or fluid (take in case of weight gain 2 to 3 lbs in 24 hrs or 5 lbs in 7 days.). Patient taking differently: Take 20 mg by mouth daily. 03/28/22 01/10/23  Arrien, York Ram, MD  hydrOXYzine (VISTARIL) 25 MG capsule Take 25 mg by mouth 3 (three) times daily as needed for itching.    [provider]  ipratropium-albuterol (DUONEB) 0.5-2.5 (3) MG/3ML SOLN Take 3 mLs by nebulization every 6 (six) hours as needed (shortness of breath). 11/22/22   [provider]  KLOR-CON M10 10 MEQ tablet Take 10 mEq by mouth 2 (two) times daily. 12/07/22   [provider]  loperamide (IMODIUM) 2 MG capsule Take 1 capsule (2 mg total) by mouth 4 (four) times daily as needed for diarrhea or loose stools. 01/07/22   Long, Arlyss Repress, MD  losartan (COZAAR) 25 MG tablet Take 1 tablet (25 mg total) by mouth daily. 03/28/22 01/03/23  Arrien, York Ram, MD  metFORMIN (GLUCOPHAGE) 500 MG tablet 1/2 tab daily with breakfast 02/07/23   Danford, Orpha Bur D, NP  nitroGLYCERIN (NITROSTAT) 0.3 MG SL tablet Place 0.3 mg under the tongue every 5 (five) minutes as needed for chest pain.    [provider]  nystatin (MYCOSTATIN/NYSTOP)  powder Apply 1 application topically 3 (three) times daily as needed for rash.    [provider]  ondansetron (ZOFRAN-ODT) 4 MG disintegrating tablet Take 1 tablet (4 mg total) by mouth every 8 (eight) hours as needed for nausea or vomiting. 10/15/22   Chestine Spore, Meghan R, PA-C  oxyCODONE (OXY IR/ROXICODONE) 5 MG immediate release tablet Take 1 tablet by mouth every 6 (six) hours as needed.    [provider]  sertraline (ZOLOFT) 25 MG tablet Take 1 tablet (25 mg total) by mouth daily. 01/11/23   Danford, Orpha Bur D, NP  spironolactone (ALDACTONE) 25 MG tablet Take 1/2 tablet (12.5 mg total) by mouth daily. Patient taking differently: Take 25 mg by mouth every other day. 03/28/22 01/10/23  Arrien, York Ram, MD  Vitamin D, Ergocalciferol, (DRISDOL) 1.25 MG (50000 UNIT) CAPS capsule Take 1 capsule (50,000 Units total) by mouth every 7 (seven) days. 02/07/23   Julaine Fusi, NP                                                                                                                                    Past Surgical History Past Surgical History:  Procedure Laterality Date   ABDOMINAL HYSTERECTOMY  2005   APPENDECTOMY     BREAST LUMPECTOMY WITH RADIOACTIVE SEED AND SENTINEL LYMPH NODE BIOPSY Right 11/16/2021   Procedure: RIGHT BREAST RADIOACTIVE SEED LOCALIZED LUMPECTOMY WITH RIGHT AXILLARY SENTINEL LYMPH NODE BIOPSY;  Surgeon: Manus Rudd, MD;  Location: [facility];  Service: General;  Laterality: Right;  LMA & PEC BLOCK   CARDIAC CATHETERIZATION     Normal   CARDIAC CATHETERIZATION N/A 09/30/2014   Procedure: Left Heart Cath and Coronary Angiography;  Surgeon: Tonny Bollman, MD;  Location: [facility] INVASIVE CV LAB;  Service: Cardiovascular;  Laterality: N/A;   LAPAROSCOPIC APPENDECTOMY N/A 06/03/2012   Procedure: APPENDECTOMY LAPAROSCOPIC;  Surgeon: Almond Lint, MD;  Location: [facility] OR;  Service: General;  Laterality: N/A;   Left knee surgery  2008   LEG SURGERY      RIGHT/LEFT HEART CATH AND CORONARY ANGIOGRAPHY N/A 01/10/2023   Procedure: RIGHT/LEFT HEART CATH AND CORONARY ANGIOGRAPHY;  Surgeon: Orbie Pyo, MD;  Location: [facility] INVASIVE CV LAB;  Service: Cardiovascular;  Laterality: N/A;   TONSILLECTOMY     TOTAL HIP ARTHROPLASTY Left 06/11/2018   Procedure: LEFT TOTAL HIP ARTHROPLASTY ANTERIOR APPROACH;  Surgeon: Cammy Copa, MD;  Location: [facility] OR;  Service: Orthopedics;  Laterality: Left;   TOTAL KNEE ARTHROPLASTY Left 08/22/2016   Procedure: TOTAL KNEE ARTHROPLASTY;  Surgeon: Cammy Copa, MD;  Location: [facility] OR;  Service: Orthopedics;  Laterality: Left;   TUBAL LIGATION  1989   Family History Family History  Problem Relation Age of Onset   Emphysema Mother    Arthritis Mother    Heart failure Mother        alive @ 39   Stroke Mother    Diabetes Mother    Hypertension Mother    Hyperlipidemia Mother    Depression Mother    Anxiety disorder Mother    Heart disease Mother    Alcoholism Mother    Depression Father    Heart disease Father        died @ 72's.   Stroke Father    Diabetes Father    Hyperlipidemia Father    Hypertension Father    Bipolar disorder Father    Sleep apnea Father    Alcoholism Father    Drug abuse Father    Sudden death Father    Diabetes Sister    Asthma Brother    Breast cancer Maternal Aunt 34 - 45   Colon cancer Maternal Grandfather        dx after 50   Heart disease Paternal Grandfather    Breast cancer Cousin        dx 77s   Esophageal cancer Neg Hx    Stomach cancer Neg Hx    Rectal cancer Neg Hx     Social History Social History   Tobacco Use   Smoking status: Former    Current packs/day: 0.00    Average packs/day: 0.5 packs/day for 15.0 years (7.5 ttl pk-yrs)    Types: Cigarettes    Start date: 04/24/1970    Quit date: 04/24/1985    Years since quitting: 37.8   Smokeless tobacco: Never  Vaping Use   Vaping status: Never Used  Substance Use Topics   Alcohol use: No     Alcohol/week: 0.0 standard drinks of alcohol   Drug use: Not Currently    Types: Marijuana   Allergies Shellfish allergy, Hydrocodone-acetaminophen, Penicillins, Gabapentin, Latex, and Sulfa antibiotics  Review of Systems Review of Systems  Constitutional:  Negative for chills, diaphoresis and fever.  Respiratory:  Negative for chest tightness and shortness of breath.   Cardiovascular:  Positive for chest pain.  Gastrointestinal:  Negative for abdominal pain, nausea and vomiting.  Genitourinary:  Negative for dysuria and urgency.  Musculoskeletal:  Positive for arthralgias.  Skin:  Negative  for wound.  All other systems reviewed and are negative.   Physical Exam Vital Signs  I have reviewed the triage vital signs BP 132/64   Pulse 71   Temp 97.9 F (36.6 C) (Oral)   Resp 17   Ht 4\' 10"  (1.473 m)   Wt 95.3 kg   SpO2 100%   BMI 43.89 kg/m  Physical Exam Vitals and nursing note reviewed.  Constitutional:      General: She is not in acute distress.    Appearance: Normal appearance.  HENT:     Head: Normocephalic and atraumatic.     Right Ear: External ear normal.     Left Ear: External ear normal.     Nose: Nose normal.     Mouth/Throat:     Mouth: Mucous membranes are moist.  Eyes:     General: No scleral icterus.       Right eye: No discharge.        Left eye: No discharge.  Cardiovascular:     Rate and Rhythm: Normal rate and regular rhythm.     Pulses: Normal pulses.     Heart sounds: Normal heart sounds.     No S3 or S4 sounds.  Pulmonary:     Effort: Pulmonary effort is normal. No respiratory distress.     Breath sounds: Normal breath sounds. No stridor.  Abdominal:     General: Abdomen is flat. There is no distension.     Palpations: Abdomen is soft.     Tenderness: There is no abdominal tenderness.  Musculoskeletal:     Cervical back: No rigidity.     Right lower leg: No edema.     Left lower leg: No edema.  Skin:    General: Skin is warm and dry.      Capillary Refill: Capillary refill takes less than 2 seconds.  Neurological:     Mental Status: She is alert.  Psychiatric:        Mood and Affect: Mood normal.        Behavior: Behavior normal. Behavior is cooperative.     ED Results and Treatments Labs (all labs ordered are listed, but only abnormal results are displayed) Labs Reviewed  RESP PANEL BY RT-PCR (RSV, FLU A&B, COVID)  RVPGX2  CBC  BRAIN NATRIURETIC PEPTIDE  BASIC METABOLIC PANEL  TROPONIN I (HIGH SENSITIVITY)  TROPONIN I (HIGH SENSITIVITY)                                                                                                                          Radiology CT Angio Chest PE W and/or Wo Contrast  Result Date: 02/15/2023 CLINICAL DATA:  Chest pain EXAM: CT ANGIOGRAPHY CHEST WITH CONTRAST TECHNIQUE: Multidetector CT imaging of the chest was performed using the standard protocol during bolus administration of intravenous contrast. Multiplanar CT image reconstructions and MIPs were obtained to evaluate the vascular anatomy. RADIATION DOSE REDUCTION: This exam was performed according to the departmental dose-optimization  program which includes automated exposure control, adjustment of the mA and/or kV according to patient size and/or use of iterative reconstruction technique. CONTRAST:  65mL OMNIPAQUE IOHEXOL 350 MG/ML SOLN COMPARISON:  Multiple priors, most recent chest CTA dated December 15, 2022 FINDINGS: Cardiovascular: No evidence of pulmonary embolus. Limited evaluation of the distal segmental and subsegmental pulmonary arteries due to motion artifact. Dilated main pulmonary artery, measuring up to 3.2 cm. Mild cardiomegaly. No pericardial effusion. Normal caliber thoracic aorta with mild calcified plaque. Mediastinum/Nodes: Esophagus is unremarkable. Heterogeneous and enlarged right thyroid lobe, unchanged when compared with the prior. Nodule of the anterior mediastinum measuring 3.2 x 1.7 cm, unchanged when  compared with multiple priors. No enlarged lymph nodes seen in the chest. Lungs/Pleura: Central airways are patent. No consolidation, pleural effusion or pneumothorax. Upper Abdomen: No acute abnormality. Musculoskeletal: Postsurgical changes of the left breast. Aggressive appearing osseous lesions. Review of the MIP images confirms the above findings. IMPRESSION: 1. No evidence of pulmonary embolus.Limited evaluation of the distal segmental and subsegmental pulmonary arteries due to motion artifact. 2. Dilated main pulmonary artery, findings can be seen in the setting of pulmonary hypertension. 3. Stable right thyroid goiter with substernal extension. 4. Aortic Atherosclerosis (ICD10-I70.0). Electronically Signed   By: Allegra Lai M.D.   On: 02/15/2023 20:10   VAS [name] LOWER EXTREMITY VENOUS (DVT) (7a-7p)  Result Date: 02/15/2023  Lower Venous DVT Study Patient Name:  [name]  Date of Exam:   02/15/2023 Medical Rec #: [id number]            Accession #:    [id number] Date of Birth: 26-Sep-1960            Patient Gender: F Patient Age:   66 years Exam Location:  [facility] Procedure:      VAS [name] LOWER EXTREMITY VENOUS (DVT) Referring Phys: Tanda Rockers --------------------------------------------------------------------------------  Indications: Edema.  Comparison Study: Prior negative right LEV done 02/27/24 Performing Technologist: Sherren Kerns RVS  Examination Guidelines: A complete evaluation includes B-mode imaging, spectral Doppler, color Doppler, and power Doppler as needed of all accessible portions of each vessel. Bilateral testing is considered an integral part of a complete examination. Limited examinations for reoccurring indications may be performed as noted. The reflux portion of the exam is performed with the patient in reverse Trendelenburg.  +---------+---------------+---------+-----------+----------+--------------+ RIGHT     CompressibilityPhasicitySpontaneityPropertiesThrombus Aging +---------+---------------+---------+-----------+----------+--------------+ CFV      Full           Yes      Yes                                 +---------+---------------+---------+-----------+----------+--------------+ SFJ      Full                                                        +---------+---------------+---------+-----------+----------+--------------+ FV Prox  Full                                                        +---------+---------------+---------+-----------+----------+--------------+ FV Mid   Full                                                        +---------+---------------+---------+-----------+----------+--------------+  FV DistalFull                                                        +---------+---------------+---------+-----------+----------+--------------+ PFV      Full                                                        +---------+---------------+---------+-----------+----------+--------------+ POP      Full           Yes      Yes                                 +---------+---------------+---------+-----------+----------+--------------+ PTV      Full                                                        +---------+---------------+---------+-----------+----------+--------------+ PERO     Full                                                        +---------+---------------+---------+-----------+----------+--------------+   +----+---------------+---------+-----------+----------+--------------+ LEFTCompressibilityPhasicitySpontaneityPropertiesThrombus Aging +----+---------------+---------+-----------+----------+--------------+ CFV Full           Yes      Yes                                 +----+---------------+---------+-----------+----------+--------------+     Summary: RIGHT: - There is no evidence of deep vein thrombosis in the lower  extremity.  - No cystic structure found in the popliteal fossa.  LEFT: - No evidence of common femoral vein obstruction.   *See table(s) above for measurements and observations. Electronically signed by Heath Lark on 02/15/2023 at 5:35:08 PM.    Final    DG Chest Port 1 View  Result Date: 02/15/2023 CLINICAL DATA:  Chest pain EXAM: PORTABLE CHEST 1 VIEW COMPARISON:  Chest radiograph dated 12/14/2022 FINDINGS: Low lung volumes. Bibasilar hazy and patchy opacities. No pleural effusion or pneumothorax. Enlarged cardiomediastinal silhouette. No acute osseous abnormality. IMPRESSION: 1. Low lung volumes with bibasilar hazy and patchy opacities, which may represent atelectasis, aspiration, or pneumonia. 2. Cardiomegaly. Electronically Signed   By: Agustin Cree M.D.   On: 02/15/2023 16:56    Pertinent labs & imaging results that were available during my care of the patient were reviewed by me and considered in my medical decision making (see MDM for details).  Medications Ordered in ED Medications  lidocaine (LIDODERM) 5 % 1 patch (1 patch Transdermal Patch Applied 02/15/23 1743)  ketorolac (TORADOL) 15 MG/ML injection 15 mg (15 mg Intravenous Given 02/15/23 1743)  iohexol (OMNIPAQUE) 350 MG/ML injection 65 mL (65 mLs Intravenous Contrast Given 02/15/23 1922)  Procedures Procedures  (including critical care time)  Medical Decision Making / ED Course    Medical Decision Making:    [name] is a 62 y.o. female with past medical history as below, significant for angina, HFpEF, prior PE no longer on AC, clotting disorder, IBS, joint pain, depression who presents to the ED with complaint of chest / arm pain. Right knee pain.. The complaint involves an extensive differential diagnosis and also carries with it a high risk of complications and morbidity.  Serious  etiology was considered. Ddx includes but is not limited to: Differential includes all life-threatening causes for chest pain. This includes but is not exclusive to acute coronary syndrome, aortic dissection, pulmonary embolism, cardiac tamponade, community-acquired pneumonia, pericarditis, musculoskeletal chest wall pain, etc.   Complete initial physical exam performed, notably the patient  was NAD, HDS.    Reviewed and confirmed nursing documentation for past medical history, family history, social history.  Vital signs reviewed.    Clinical Course as of 02/15/23 2257  Thu Feb 15, 2023  1609 EMS rhythm strip interpreted by myself shows normal sinus rhythm [SG]  1624 Given ASA PTA, she has chest pain ongoing/pressure [SG]  1656 Duplex neg for DVT [SG]  2136 CTPE stable, no PE, evidence of pulm htn noted along with stable goiter [SG]    Clinical Course User Index [SG] Tanda Rockers A, DO     Labs/imaging stable Recent heart cath reviewed, nonobstructive  CTPE stable LE duplex stable CXR stable ASA pta Now resolved   Symptoms resolved following Toradol.  Favor atypical chest pain.  She has troponin x 2, EKG without ischemic changes, chest x-ray unremarkable.    The patient's chest pain is not suggestive of pulmonary embolus, cardiac ischemia, aortic dissection, pericarditis, myocarditis, pulmonary embolism, pneumothorax, pneumonia, Zoster, or esophageal perforation, or other serious etiology.  Historically not abrupt in onset, tearing or ripping, pulses symmetric. EKG nonspecific for ischemia/infarction. No dysrhythmias, brugada, WPW, prolonged QT noted.   Troponin negative x2. CXR reviewed. Labs without demonstration of acute pathology unless otherwise noted above. Low HEART Score: 0-3 points (0.9-1.7% risk of MACE).  Given the extremely low risk of these diagnoses further testing and evaluation for these possibilities does not appear to be indicated at this time. Patient in no  distress and overall condition improved here in the ED. Detailed discussions were had with the patient regarding current findings, and need for close f/u with PCP or on call doctor. The patient has been instructed to return immediately if the symptoms worsen in any way for re-evaluation. Patient verbalized understanding and is in agreement with current care plan. All questions answered prior to discharge.                Additional history obtained: -Additional history obtained from na -External records from outside source obtained and reviewed including: Chart review including previous notes, labs, imaging, consultation notes including  Prior cardiology documentation Recent heart cath right/left Home medications Prior labs and imaging   Lab Tests: -I ordered, reviewed, and interpreted labs.   The pertinent results include:   Labs Reviewed  RESP PANEL BY RT-PCR (RSV, FLU A&B, COVID)  RVPGX2  CBC  BRAIN NATRIURETIC PEPTIDE  BASIC METABOLIC PANEL  TROPONIN I (HIGH SENSITIVITY)  TROPONIN I (HIGH SENSITIVITY)    Notable for labs stable  EKG   EKG Interpretation Date/Time:  Thursday February 15 2023 14:52:38 EDT Ventricular Rate:  68 PR Interval:  194 QRS Duration:  126 QT Interval:  387 QTC Calculation: 412 R Axis:   41  Text Interpretation: Sinus rhythm Nonspecific intraventricular conduction delay Anteroseptal infarct, old Nonspecific T abnormalities, lateral leads no stemi similar to prior tracing 8/24 Confirmed by Tanda Rockers ([id number]) on 02/15/2023 4:10:11 PM         Imaging Studies ordered: I ordered imaging studies including CXR CTPE LEDUP I independently visualized the following imaging with scope of interpretation limited to determining acute life threatening conditions related to emergency care; findings noted above I independently visualized and interpreted imaging. I agree with the radiologist interpretation   Medicines ordered and prescription drug  management: Meds ordered this encounter  Medications   ketorolac (TORADOL) 15 MG/ML injection 15 mg   lidocaine (LIDODERM) 5 % 1 patch   iohexol (OMNIPAQUE) 350 MG/ML injection 65 mL   pantoprazole (PROTONIX) 20 MG tablet    Sig: Take 1 tablet (20 mg total) by mouth daily for 14 days.    Dispense:  14 tablet    Refill:  0   cyclobenzaprine (FLEXERIL) 10 MG tablet    Sig: Take 1 tablet (10 mg total) by mouth 2 (two) times daily as needed for muscle spasms.    Dispense:  20 tablet    Refill:  0    -I have reviewed the patients home medicines and have made adjustments as needed   Consultations Obtained: na   Cardiac Monitoring: The patient was maintained on a cardiac monitor.  I personally viewed and interpreted the cardiac monitored which showed an underlying rhythm of: NSR Continuous pulse oximetry interpreted by myself, 100% on RA.    Social Determinants of Health:  Diagnosis or treatment significantly limited by social determinants of health: former smoker and obesity   Reevaluation: After the interventions noted above, I reevaluated the patient and found that they have resolved  Co morbidities that complicate the patient evaluation  Past Medical History:  Diagnosis Date   Allergy    Anginal pain (HCC)    a. NL cath in 2008;  b. Myoview 03/2011: dec uptake along mid anterior wall on stress imaging -> ? attenuation vs. ischemia, EF 65%;  c. 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 2008 -> coumadin x 6 mos.   Rheumatoid arthritis (HCC)    Sleep apnea    on CPAP 02/2018   SOB (shortness of breath)    Thyroid disease    TIA (transient ischemic attack)    Urinary urgency  Vitamin D deficiency       Dispostion: Disposition decision including need for hospitalization was considered, and patient discharged from emergency department.    Final Clinical Impression(s) / ED Diagnoses Final diagnoses:  Nonspecific chest pain        Sloan Leiter, DO 02/15/23 2257

## 2023-02-19 NOTE — Progress Notes (Signed)
[facility] PRE[facility] Weekly Session  Patient Details  Name: TIKA BROADLEY MRN: [id number] Date of Birth: [birth year] Age: 62 y.o. PCP: Raymon Mutton., FNP  Vitals:   02/19/23 1030  Weight: 208 lb (94.3 kg)     [facility] Weekly seesion - 02/19/23 1500       [facility] "PREP" Location   [facility] "PREP" Location [facility]      Weekly Session   Topic Discussed Hitting roadblocks    Minutes exercised this week 660 minutes    Classes attended to date 17             Bonnye Fava 02/19/2023, 3:46 PM

## 2023-02-22 ENCOUNTER — Encounter: Payer: Self-pay | Admitting: Physician Assistant

## 2023-02-22 ENCOUNTER — Ambulatory Visit: Payer: 59 | Attending: Physician Assistant | Admitting: Physician Assistant

## 2023-02-22 VITALS — BP 114/68 | HR 72 | Ht <= 58 in | Wt 210.6 lb

## 2023-02-22 DIAGNOSIS — E785 Hyperlipidemia, unspecified: Secondary | ICD-10-CM | POA: Diagnosis not present

## 2023-02-22 DIAGNOSIS — I5030 Unspecified diastolic (congestive) heart failure: Secondary | ICD-10-CM | POA: Diagnosis not present

## 2023-02-22 DIAGNOSIS — I5032 Chronic diastolic (congestive) heart failure: Secondary | ICD-10-CM | POA: Diagnosis not present

## 2023-02-22 DIAGNOSIS — I10 Essential (primary) hypertension: Secondary | ICD-10-CM

## 2023-02-22 DIAGNOSIS — I2089 Other forms of angina pectoris: Secondary | ICD-10-CM

## 2023-02-22 DIAGNOSIS — R0602 Shortness of breath: Secondary | ICD-10-CM

## 2023-02-22 MED ORDER — LOSARTAN POTASSIUM 25 MG PO TABS: 25.0000 mg | ORAL_TABLET | Freq: Every day | ORAL | 1 refills | Status: DC

## 2023-02-22 MED ORDER — CARVEDILOL 6.25 MG PO TABS: 6.2500 mg | ORAL_TABLET | Freq: Two times a day (BID) | ORAL | 1 refills | Status: DC

## 2023-02-22 MED ORDER — SPIRONOLACTONE 25 MG PO TABS: 25.0000 mg | ORAL_TABLET | ORAL | 1 refills | Status: DC

## 2023-02-22 NOTE — Progress Notes (Signed)
Office Visit    Patient Name: [name] Date of Encounter: 02/22/2023  PCP:  Raymon Mutton., FNP   [facility]  Cardiologist:  Charlton Haws, MD  Advanced Practice Provider:  No care team member to display Electrophysiologist:  None   HPI    [name] is a 62 y.o. female with past medical history significant for breast cancer (newly diagnosed), bone cancer, HFpEF, OSA, remote history of PE no longer on OAC, TAA, HTN, atypical chest pain and HLD presents today for hospital follow-up appointment.  She was originally seen by Dr. [place] in 2008 and underwent normal LHC with normal LV function.  She was treated with Coumadin in 04/2006 for questionable PE.  Cardiac CTA was completed 01/2010 which showed no PE.  She was seen in the ED 2012 for complaints of chest pain.  She underwent nuclear stress test with Myoview that was found to be low risk, chest pain was likely ruled musculoskeletal etiology.  Patient has subsequent repeat Myoview for chest pain in 10/2012 which was normal.  She was hospitalized 05/2014 with chest pain and left-sided numbness and MRI was completed to rule out stroke which was negative.  She was seen in the ER for chest pain 09/03/2020.  Ruled out with negative tropes.  D-dimer was elevated.  CTA negative for PE.  She was seen in the ED for complaint of chest pain in 02/2021.  Patient reported throbbing dull pain in the center of her chest with associated shortness of breath.  CT was completed that was negative for PE but did reveal possible worsening pericardial effusion.  2D echocardiogram was ordered which showed EF 50 to 55%, no RWMA, mild LVH, grade 2 DD, and normal valve function.  Her troponins were negative x 2 and EKG had nonspecific ST changes.  Patient had relief with Nitropaste and was referred to GI for possible upper endoscopy.  She was seen in the ED 11/2020 with complaint of chest pain or shortness of breath thought to be  possible CHF exacerbation.  BNP was normal and troponins were negative.  The patient was last seen 12/12/2021 and had just been in the ED for possible CHF exacerbation.  She reported not feeling well and denied any current chest discomfort on follow-up.  She has been euvolemic on exam with been compliant with all of her current medications.  Blood pressure 108/70 and reported that they have been on the low side for a few days.  11/30-12/5 She was admitted and started on spiro, jardiance, and losartan. Echo reviewed with normal diastolic and systolic function. She has sciatica which keeps her from doing any significant walking. She tells me that after her last chemo treatment was when the swelling in her legs started. Seen in follow-up 12/19 by myself and  she does has some swelling and left arm pain. She tells me she has gained about 7 lbs since she was in the hospital. No chest pain.    She was seen by me 04/2022, she shares her SOB and lower ext swelling is much better. She has not had any chest pain or palpitations. Her cancer treatments are over and now she has noticed less swelling. She did call the cancer center Friday since she has some radiation burn under her right breast and needed some cream. She ended up using an OTC burn cream which has helped. She does not feel like her concerns are being addressed by the cancer center.  Otherwise, doing well from a CV standpoint.  Recently in the ED  for atypical chest pain.  Troponins were negative and concern for ACS was low.  Nonexertional.  It was associated with diaphoresis and vomiting.  History of PE not currently on any blood thinners.  Pain was improving.  CT was negative for PE.  Plan for follow-up with cardiology with no additional testing.  I saw her 11/07/2022, she says her chest pain was like a sharp pain and then dull. Nothing that she did would make it go away. Left side and then to her back. Was exertional, got worse when she was walking, and  then resolved with rest. Sometimes positional. We discussed updating an  echo too r/o pericarditis. She explained it as she was putting on her clothes and it "caught her" and then had some nasuea and sweating, throwing up. She had not eaten anything that morning when she went to church. She has not used her nitroglycerin at all. She does endorse headaches at times. Occassional palpitations but do not last long.  She underwent a right and left heart catheterization September 2024 and was diagnosed with nonobstructive CAD.  Her coronary disease is relatively unchanged compared to her angiography in 2016, medical therapy is recommended.  Today, she recently underwent cardiac catheterization, has mild non-obstructive coronary disease, and diabetes, presents with concerns about recent episodes of chest pain, shoulder pain, and leg pain. The patient describes an episode last week where they experienced chest pain radiating to the back, nausea, and difficulty walking due to leg pain. The patient sought emergency care for these symptoms, where a CT scan and other tests were performed. The patient reports that the tests did not reveal any significant findings, and they were discharged with a diagnosis of musculoskeletal pain.  The patient also discusses their ongoing management of diabetes. She expresses a desire to lose weight and potentially reduce their need for diabetes medication. She report previous use of Ozempic for weight loss, but discontinued due to side effects.  The patient also mentions a history of cancer and the stress associated with their various health concerns.She express a desire to make lifestyle changes to improve their health and reduce their medication burden.  AI recording device used during encounter with patient's permission.  Reports no shortness of breath nor dyspnea on exertion.  No orthopnea, PND. Reports no palpitations.    Past Medical History    Past Medical History:   Diagnosis Date   Allergy    Anginal pain (HCC)    a. NL cath in 2008;  b. Myoview 03/2011: dec uptake along mid anterior wall on stress imaging -> ? attenuation vs. ischemia, EF 65%;  c. Echo 04/2011: EF 55-60%, no RWMA, Gr 2 dd   Anxiety    Arthritis    Asthma    Back pain    Bone cancer (HCC)    Cancer (HCC)    Chest pain    CHF (congestive heart failure) (HCC)    Clotting disorder (HCC)    Constipation    Depression    Diabetes mellitus    Drug use    Dyspnea    Frequent urination    GERD (gastroesophageal reflux disease)    Glaucoma    History of stomach ulcers    HLD (hyperlipidemia)    Hypertension    IBS (irritable bowel syndrome)    Joint pain    Joint pain    Lactose intolerance    Leg edema  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 2008 -> coumadin x 6 mos.   Rheumatoid arthritis (HCC)    Sleep apnea    on CPAP 02/2018   SOB (shortness of breath)    Thyroid disease    TIA (transient ischemic attack)    Urinary urgency    Vitamin D deficiency    Past Surgical History:  Procedure Laterality Date   ABDOMINAL HYSTERECTOMY  2005   APPENDECTOMY     BREAST LUMPECTOMY WITH RADIOACTIVE SEED AND SENTINEL LYMPH NODE BIOPSY Right 11/16/2021   Procedure: RIGHT BREAST RADIOACTIVE SEED LOCALIZED LUMPECTOMY WITH RIGHT AXILLARY SENTINEL LYMPH NODE BIOPSY;  Surgeon: Manus Rudd, MD;  Location: [facility];  Service: General;  Laterality: Right;  LMA & PEC BLOCK   CARDIAC CATHETERIZATION     Normal   CARDIAC CATHETERIZATION N/A 09/30/2014   Procedure: Left Heart Cath and Coronary Angiography;  Surgeon: Tonny Bollman, MD;  Location: [facility] INVASIVE CV LAB;  Service: Cardiovascular;  Laterality: N/A;   LAPAROSCOPIC APPENDECTOMY N/A 06/03/2012   Procedure: APPENDECTOMY LAPAROSCOPIC;  Surgeon: Almond Lint,  MD;  Location: [facility] OR;  Service: General;  Laterality: N/A;   Left knee surgery  2008   LEG SURGERY     RIGHT/LEFT HEART CATH AND CORONARY ANGIOGRAPHY N/A 01/10/2023   Procedure: RIGHT/LEFT HEART CATH AND CORONARY ANGIOGRAPHY;  Surgeon: Orbie Pyo, MD;  Location: [facility] INVASIVE CV LAB;  Service: Cardiovascular;  Laterality: N/A;   TONSILLECTOMY     TOTAL HIP ARTHROPLASTY Left 06/11/2018   Procedure: LEFT TOTAL HIP ARTHROPLASTY ANTERIOR APPROACH;  Surgeon: Cammy Copa, MD;  Location: [facility];  Service: Orthopedics;  Laterality: Left;   TOTAL KNEE ARTHROPLASTY Left 08/22/2016   Procedure: TOTAL KNEE ARTHROPLASTY;  Surgeon: Cammy Copa, MD;  Location: [facility];  Service: Orthopedics;  Laterality: Left;   TUBAL LIGATION  1989    Allergies  Allergies  Allergen Reactions   Shellfish Allergy Hives and Anaphylaxis   Hydrocodone-Acetaminophen Itching and Other (See Comments)    confusion Mental Status Changes   Penicillins Swelling and Other (See Comments)   Gabapentin     Not available   Latex Rash   Sulfa Antibiotics Diarrhea, Itching and Rash   EKGs/Labs/Other Studies Reviewed:   The following studies were reviewed today:  Coronary catheterization 01/10/2023  Diagnostic Dominance: Left Left Anterior Descending  Mid LAD lesion is 25% stenosed.    Right Coronary Artery  Vessel is small.    Intervention   No interventions have been documented.   Coronary Diagrams  Diagnostic Dominance: Left  Intervention   03/24/2022 echocardiogram  IMPRESSIONS     1. Left ventricular ejection fraction, by estimation, is 60 to 65%. The  left ventricle has normal function. The left ventricle has no regional  wall motion abnormalities. Left ventricular diastolic parameters were  normal.   2. Right ventricular systolic function is normal. The right ventricular  size is normal.   3. The mitral valve is normal in structure. Trivial mitral valve  regurgitation. No evidence of  mitral stenosis.   4. The aortic valve is tricuspid. Aortic valve regurgitation is trivial.  No aortic stenosis is present.   5. The inferior vena cava is dilated in size with <50% respiratory  variability,  suggesting right atrial pressure of 15 mmHg.   FINDINGS   Left Ventricle: Left ventricular ejection fraction, by estimation, is 60  to 65%. The left ventricle has normal function. The left ventricle has no  regional wall motion abnormalities. The left ventricular internal cavity  size was normal in size. There is   no left ventricular hypertrophy. Left ventricular diastolic parameters  were normal.   Right Ventricle: The right ventricular size is normal. Right ventricular  systolic function is normal.   Left Atrium: Left atrial size was normal in size.   Right Atrium: Right atrial size was normal in size.   Pericardium: There is no evidence of pericardial effusion.   Mitral Valve: The mitral valve is normal in structure. Trivial mitral  valve regurgitation. No evidence of mitral valve stenosis.   Tricuspid Valve: The tricuspid valve is normal in structure. Tricuspid  valve regurgitation is trivial. No evidence of tricuspid stenosis.   Aortic Valve: The aortic valve is tricuspid. Aortic valve regurgitation is  trivial. No aortic stenosis is present.   Pulmonic Valve: The pulmonic valve was normal in structure. Pulmonic valve  regurgitation is mild. No evidence of pulmonic stenosis.   Aorta: The aortic root is normal in size and structure.   Venous: The inferior vena cava is dilated in size with less than 50%  respiratory variability, suggesting right atrial pressure of 15 mmHg.   IAS/Shunts: No atrial level shunt detected by color flow Doppler.   EKG:  EKG is  ordered today.  The ekg ordered today demonstrates NSR with no ST elevation  Recent Labs: 04/11/2022: NT-Pro BNP 54 07/16/2022: Magnesium 1.9 10/15/2022: ALT 24 11/07/2022: TSH 0.746 02/15/2023: B Natriuretic  Peptide 17.0; BUN 15; Creatinine, Ser 0.80; Hemoglobin 13.4; Platelets 236; Potassium 3.7; Sodium 141  Recent Lipid Panel    Component Value Date/Time   CHOL 127 07/16/2022 0145   CHOL 127 09/28/2021 0951   TRIG 119 07/16/2022 0145   HDL 48 07/16/2022 0145   HDL 48 09/28/2021 0951   CHOLHDL 2.6 07/16/2022 0145   VLDL 24 07/16/2022 0145   LDLCALC 55 07/16/2022 0145   LDLCALC 60 09/28/2021 0951    Home Medications   Current Meds  Medication Sig   ACCU-CHEK GUIDE test strip    acetaminophen (TYLENOL) 500 MG tablet Take 1,000 mg by mouth 2 (two) times daily as needed for moderate pain.   albuterol (PROVENTIL HFA;VENTOLIN HFA) 108 (90 Base) MCG/ACT inhaler Inhale 2 puffs into the lungs every 6 (six) hours as needed for wheezing or shortness of breath.   aspirin 81 MG chewable tablet Chew 81 mg by mouth daily.   atorvastatin (LIPITOR) 40 MG tablet Take 40 mg by mouth at bedtime.    BIOTIN PO Take 1 tablet by mouth at bedtime.   busPIRone (BUSPAR) 30 MG tablet Take 30 mg by mouth 2 (two) times daily.   cyclobenzaprine (FLEXERIL) 10 MG tablet Take 1 tablet (10 mg total) by mouth 2 (two) times daily as needed for muscle spasms.   diclofenac Sodium (VOLTAREN) 1 % GEL Apply 2 g topically 4 (four) times daily as needed (joint/muscle pain).   empagliflozin (JARDIANCE) 10 MG TABS tablet Take 1 tablet (10 mg total) by mouth daily.   EPINEPHrine 0.3 mg/0.3 mL IJ SOAJ injection Inject 0.3 mg into the muscle as needed for anaphylaxis. Follow package instructions as needed for severe allergy or anaphylactic reaction.   furosemide (LASIX) 40 MG tablet Take 1 tablet (40 mg total) by mouth  daily as needed for edema or fluid (take in case of weight gain 2 to 3 lbs in 24 hrs or 5 lbs in 7 days.). (Patient taking differently: Take 20 mg by mouth daily.)   hydrOXYzine (VISTARIL) 25 MG capsule Take 25 mg by mouth 3 (three) times daily as needed for itching.   ipratropium-albuterol (DUONEB) 0.5-2.5 (3) MG/3ML  SOLN Take 3 mLs by nebulization every 6 (six) hours as needed (shortness of breath).   KLOR-CON M10 10 MEQ tablet Take 10 mEq by mouth 2 (two) times daily.   loperamide (IMODIUM) 2 MG capsule Take 1 capsule (2 mg total) by mouth 4 (four) times daily as needed for diarrhea or loose stools.   metFORMIN (GLUCOPHAGE) 500 MG tablet 1/2 tab daily with breakfast   nitroGLYCERIN (NITROSTAT) 0.3 MG SL tablet Place 0.3 mg under the tongue every 5 (five) minutes as needed for chest pain.   nystatin (MYCOSTATIN/NYSTOP) powder Apply 1 application topically 3 (three) times daily as needed for rash.   ondansetron (ZOFRAN-ODT) 4 MG disintegrating tablet Take 1 tablet (4 mg total) by mouth every 8 (eight) hours as needed for nausea or vomiting.   oxyCODONE (OXY IR/ROXICODONE) 5 MG immediate release tablet Take 1 tablet by mouth every 6 (six) hours as needed.   pantoprazole (PROTONIX) 20 MG tablet Take 1 tablet (20 mg total) by mouth daily for 14 days.   sertraline (ZOLOFT) 25 MG tablet Take 1 tablet (25 mg total) by mouth daily.   Vitamin D, Ergocalciferol, (DRISDOL) 1.25 MG (50000 UNIT) CAPS capsule Take 1 capsule (50,000 Units total) by mouth every 7 (seven) days.   [DISCONTINUED] carvedilol (COREG) 6.25 MG tablet Take 1 tablet (6.25 mg total) by mouth 2 (two) times daily.   [DISCONTINUED] losartan (COZAAR) 25 MG tablet Take 1 tablet (25 mg total) by mouth daily.     Review of Systems      All other systems reviewed and are otherwise negative except as noted above.  Physical Exam    VS:  BP 114/68   Pulse 72   Ht 4\' 10"  (1.473 m)   Wt 210 lb 9.6 oz (95.5 kg)   SpO2 97%   BMI 44.02 kg/m  , BMI Body mass index is 44.02 kg/m.  Wt Readings from Last 3 Encounters:  02/22/23 210 lb 9.6 oz (95.5 kg)  02/19/23 208 lb (94.3 kg)  02/15/23 210 lb (95.3 kg)     GEN: Well nourished, well developed, in no acute distress. HEENT: normal. Neck: Supple, no JVD, carotid bruits, or masses. Cardiac: RRR, no  murmurs, rubs, or gallops. No clubbing, cyanosis, 1+ pitting edema bilaterally.  Radials/PT 2+ and equal bilaterally.  Respiratory:  Respirations regular and unlabored, clear to auscultation bilaterally. GI: Soft, nontender, nondistended. MS: No deformity or atrophy. Skin: Warm and dry, no rash. Neuro:  Strength and sensation are intact. Psych: Normal affect.  Assessment & Plan    HFpEF -euvolemic on exam today -low sodium diet -lower ext compression -daily weights and if increases by 2 lbs overnight or 5 lbs in a week we need a phone call  Shortness of breath -euvolemic on exam today -Much improved today -she has PRN lasix  Stable Angina Recent episode of chest pain with radiation to the back and associated with nausea. Comprehensive workup in the ED including CT scan, chest x-ray, and troponin was negative. Likely musculoskeletal in nature, possibly related to recent exercise. -Continue current medical therapy for coronary artery disease.  Leg Pain Recent episode  of leg pain with associated swelling. Ultrasound ruled out deep vein thrombosis. Likely musculoskeletal in nature, possibly related to recent exercise. -Conservative management with ice, heat, and elevation as needed.  Type 2 Diabetes Mellitus Hemoglobin A1c of 7.9, indicating suboptimal glycemic control. Patient reports gastrointestinal side effects from metformin. -Consider alternative diabetes medications such as semaglutide [facility]) in consultation with [facility] team.  Weight Management Patient expresses desire to lose weight and reduce medication burden. -Continue engagement with [facility] team to explore safe and effective weight loss strategies.       Disposition: Follow up 3-6 month with Charlton Haws, MD or APP.  Signed, Sharlene Dory, PA-C 02/22/2023, 4:55 PM [facility]

## 2023-02-22 NOTE — Patient Instructions (Signed)
Medication Instructions:  Your physician recommends that you continue on your current medications as directed. Please refer to the Current Medication list given to you today. *If you need a refill on your cardiac medications before your next appointment, please call your pharmacy*   Lab Work: None Ordered   Testing/Procedures: None Ordered   Follow-Up: At [facility], you and your health needs are our priority.  As part of our continuing mission to provide you with exceptional heart care, we have created designated Provider Care Teams.  These Care Teams include your primary Cardiologist (physician) and Advanced Practice Providers (APPs -  Physician Assistants and Nurse Practitioners) who all work together to provide you with the care you need, when you need it.  We recommend signing up for the patient portal called "MyChart".  Sign up information is provided on this After Visit Summary.  MyChart is used to connect with patients for Virtual Visits (Telemedicine).  Patients are able to view lab/test results, encounter notes, upcoming appointments, etc.  Non-urgent messages can be sent to your provider as well.   To learn more about what you can do with MyChart, go to [email or web address].    Your next appointment:   3-6 month(s)  Provider:   Charlton Haws, MD     Other Instructions

## 2023-03-04 IMAGING — DX DG HIP (WITH OR WITHOUT PELVIS) 2-3V*L*
2 series · 2 of 2 positions shown · non-contrast
Comparison: None.

CLINICAL DATA: Acute arthritis

EXAM:
DG HIP (WITH OR WITHOUT PELVIS) 2-3V LEFT

[dg hip unilat w or w/o pelvis 2-3 views  (1 of 2)]
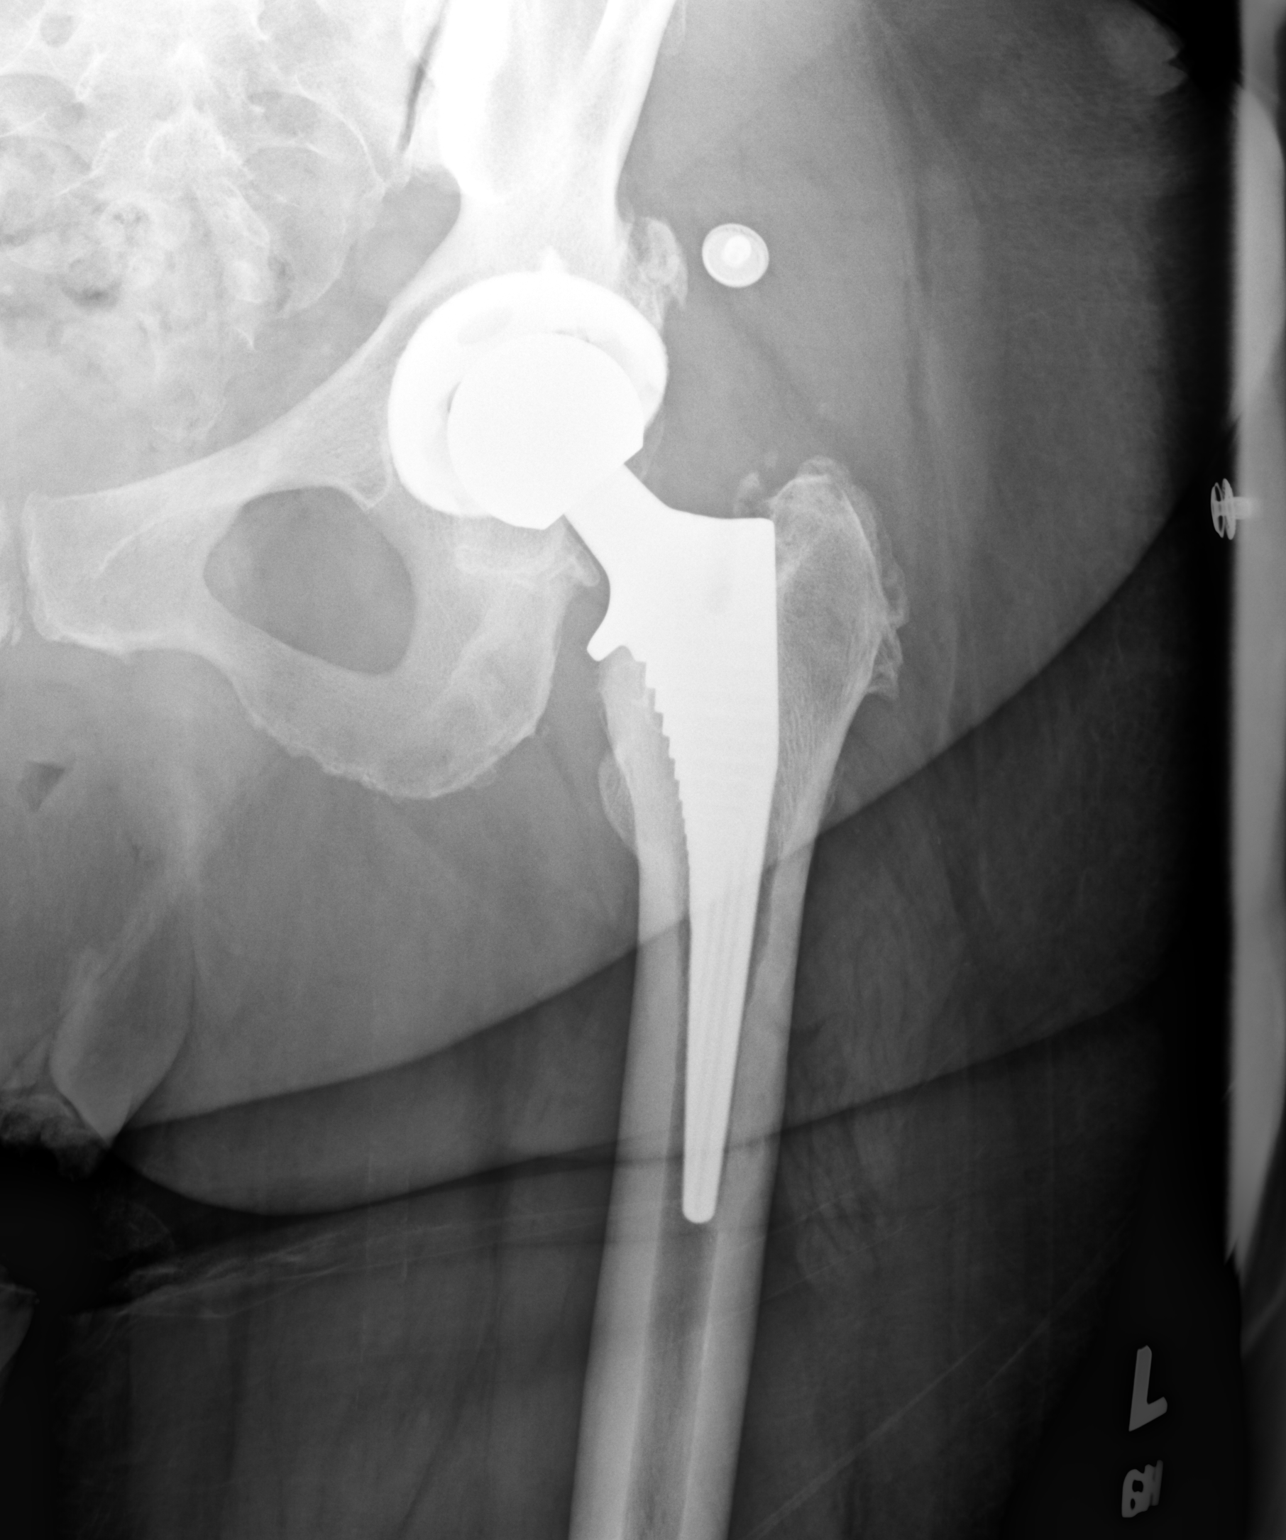

[dg hip unilat w or w/o pelvis 2-3 views  (2 of 2)]
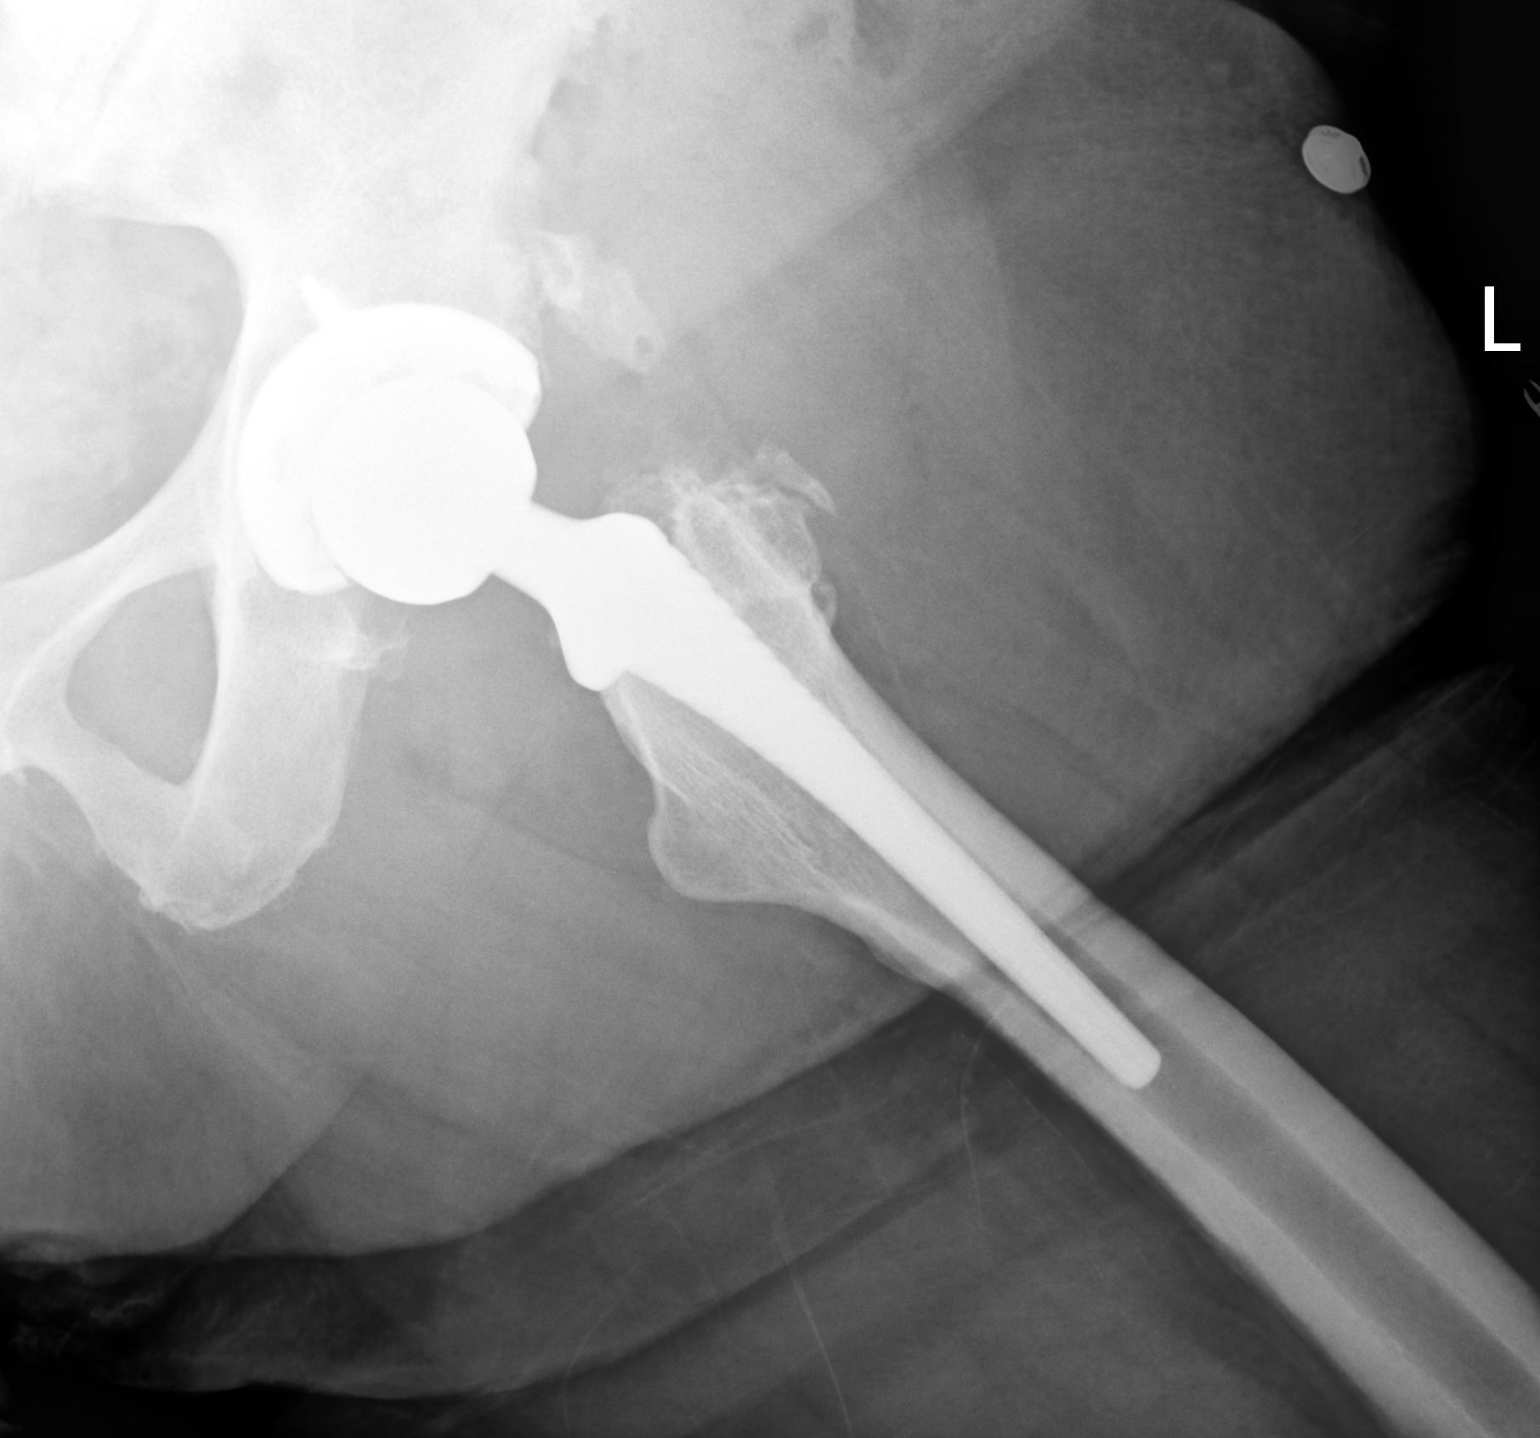

[2 of 2 positions shown; findings below may reference images not displayed]

FINDINGS: Status post left hip arthroplasty. Hardware appears well aligned
without loosening or fracture. No acute fracture. Normal
mineralization. The soft tissues are unremarkable.
IMPRESSION: Left hip arthroplasty without complicating feature. No acute
fracture.

## 2023-03-04 IMAGING — DX DG HAND COMPLETE 3+V*R*
3 series · 3 of 3 positions shown · non-contrast
Comparison: Multiple priors

CLINICAL DATA: Arthralgia of both hands

EXAM:
RIGHT HAND - COMPLETE 3+ VIEW

[dg hand complete right (1 of 3)]
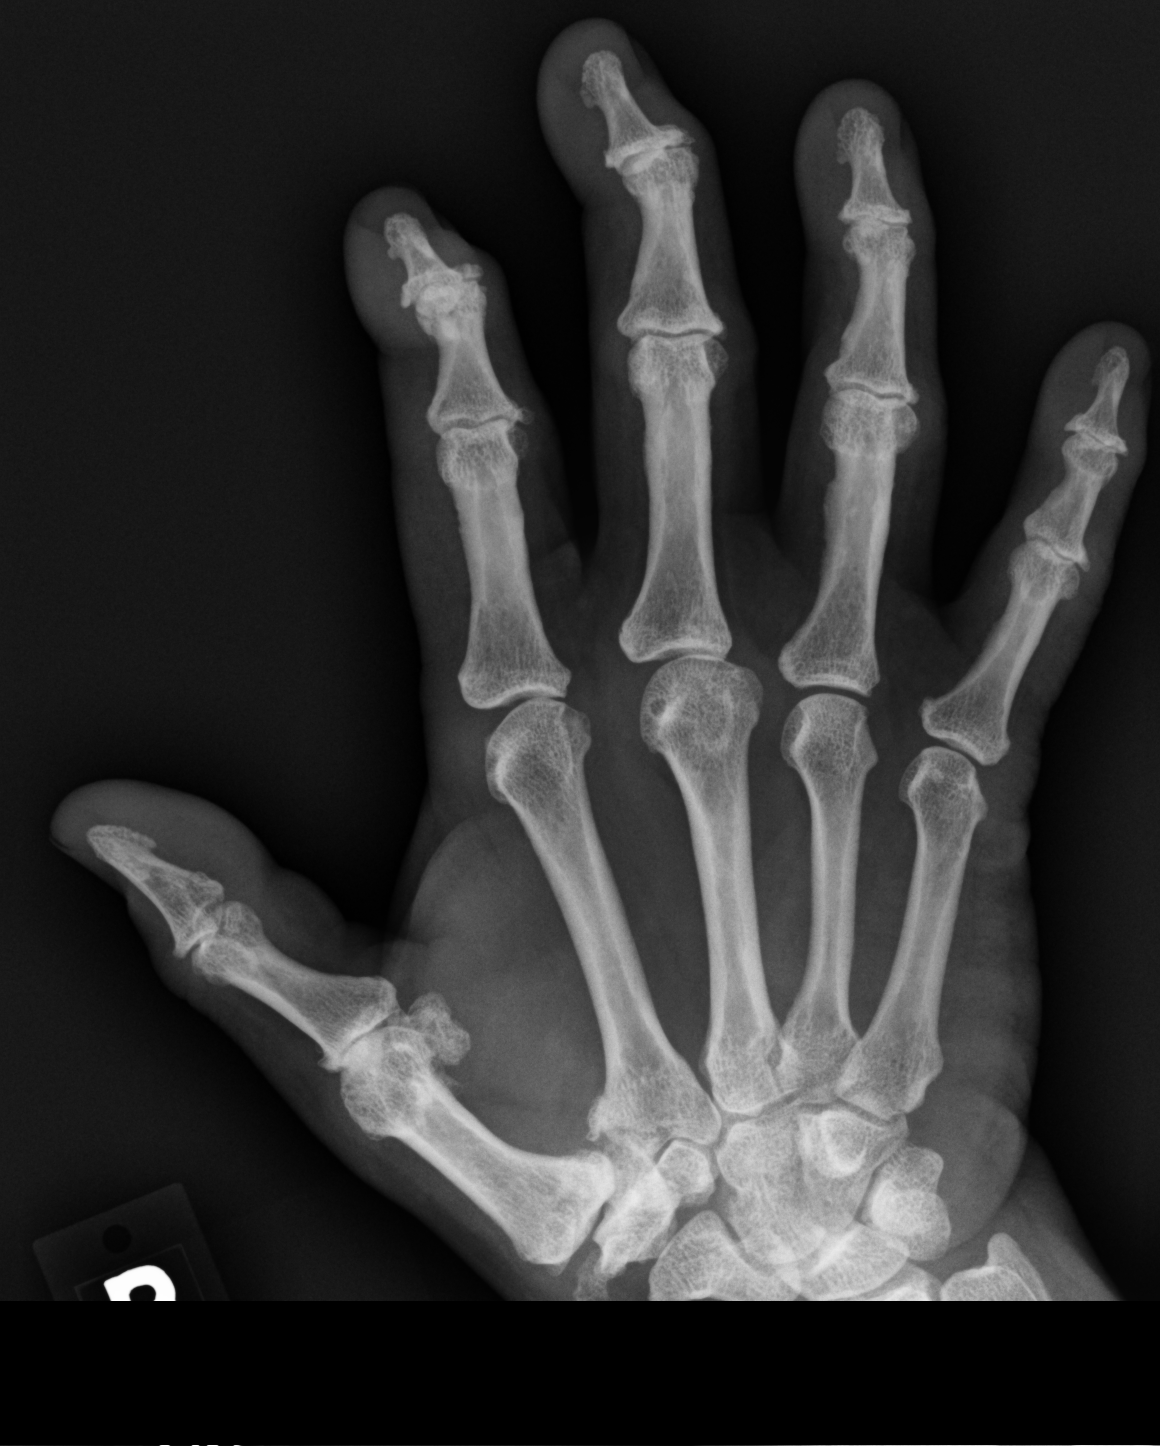

[dg hand complete right (2 of 3)]
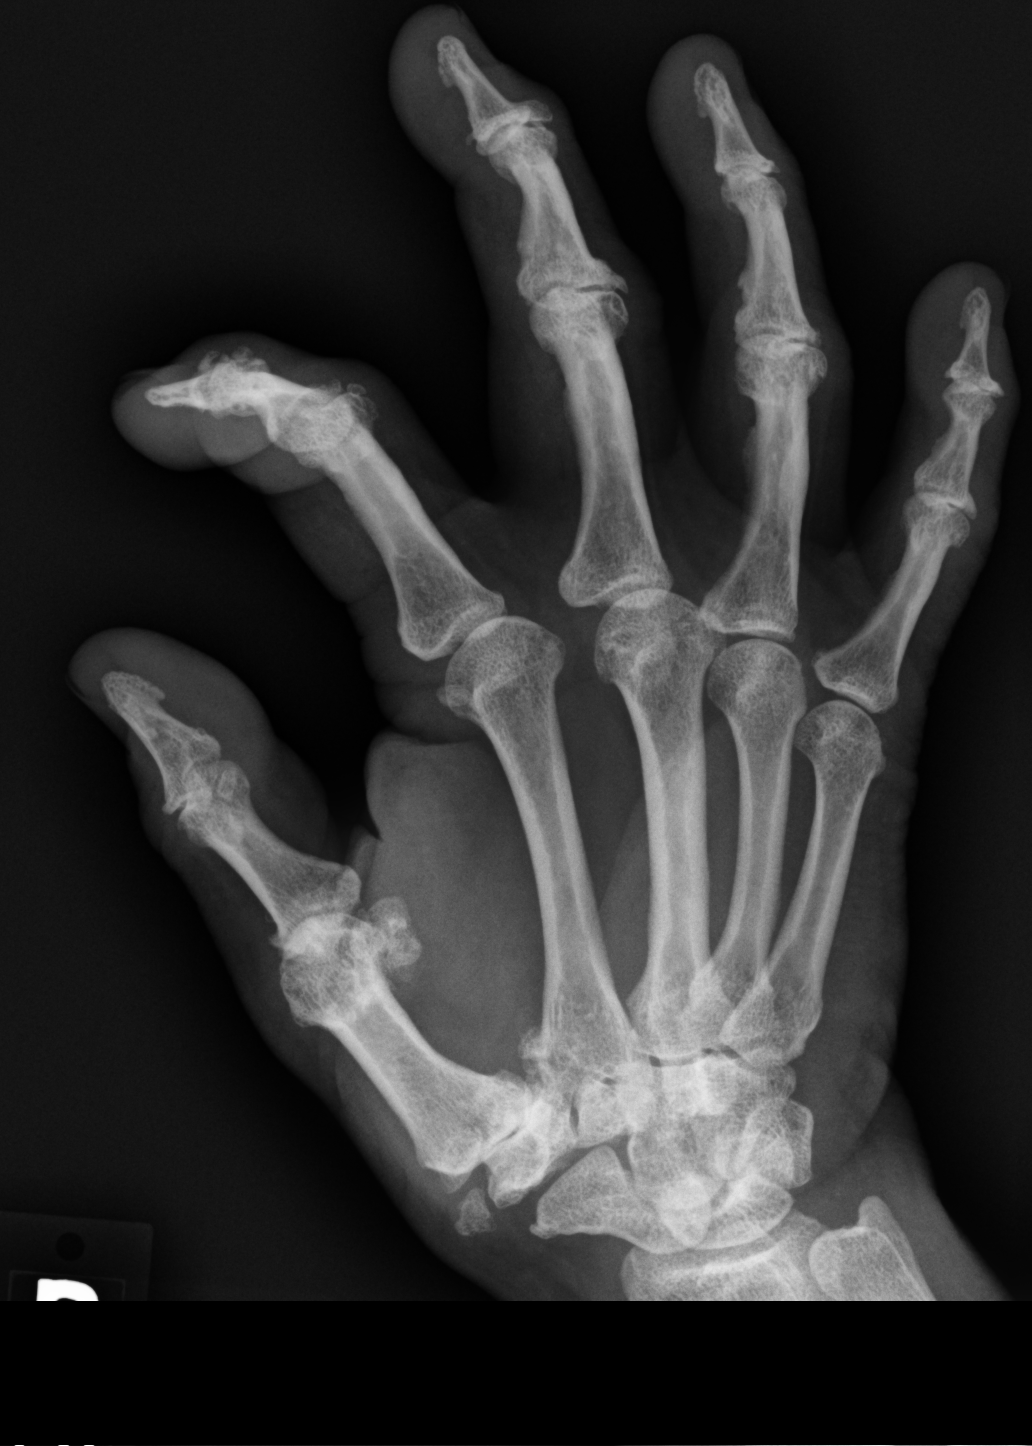

[dg hand complete right (3 of 3)]
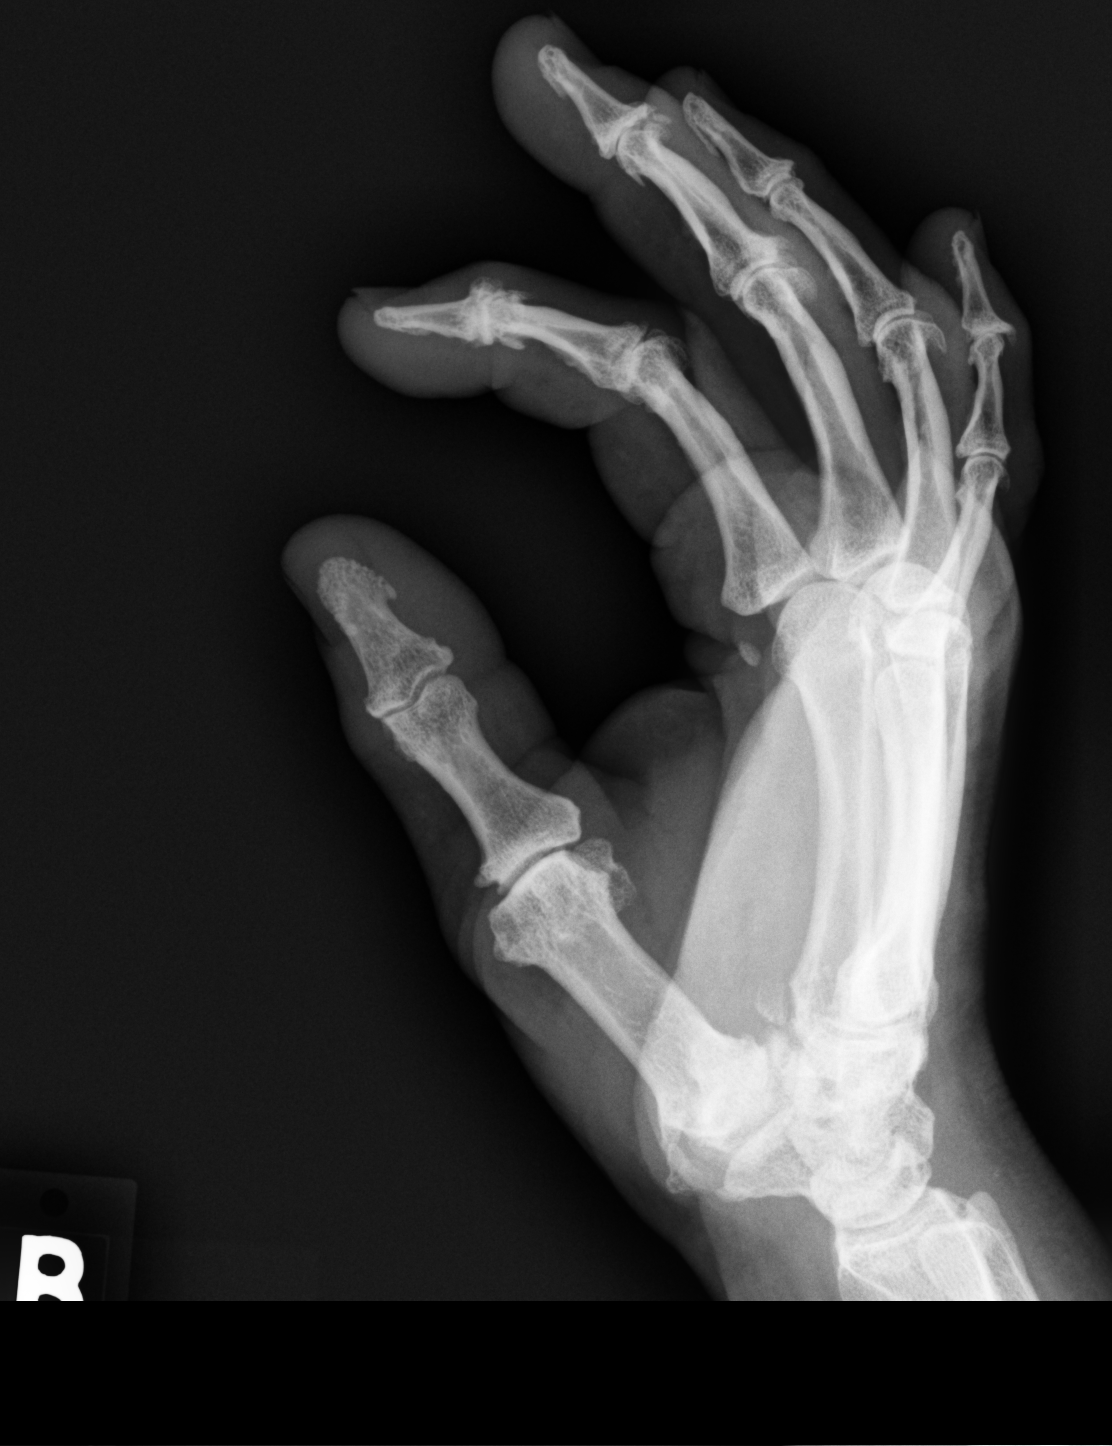

[3 of 3 positions shown; findings below may reference images not displayed]

FINDINGS: No acute fracture. There are degenerative changes of the PIP and DIP
joints as well as degenerative changes of the first CMC joint and
first MCP joint. Small osteophytes are present involving most of the
IP joints.
IMPRESSION: Scattered degenerative changes of the PIP and DIP joints, as well as
degenerative changes of the first CMC and MCP joints with
osteophytosis. Pattern of arthritis is compatible with
osteoarthritis, although elements of rheumatoid arthritis may be
present particularly at the PIP joints given provided history in the
EMR.

## 2023-03-04 IMAGING — DX DG CHEST 2V
2 series · 2 of 2 positions shown · non-contrast
Comparison: March 2021

CLINICAL DATA: Abnormal chest sounds

EXAM:
CHEST - 2 VIEW

[dg chest 2 view (1 of 2)]
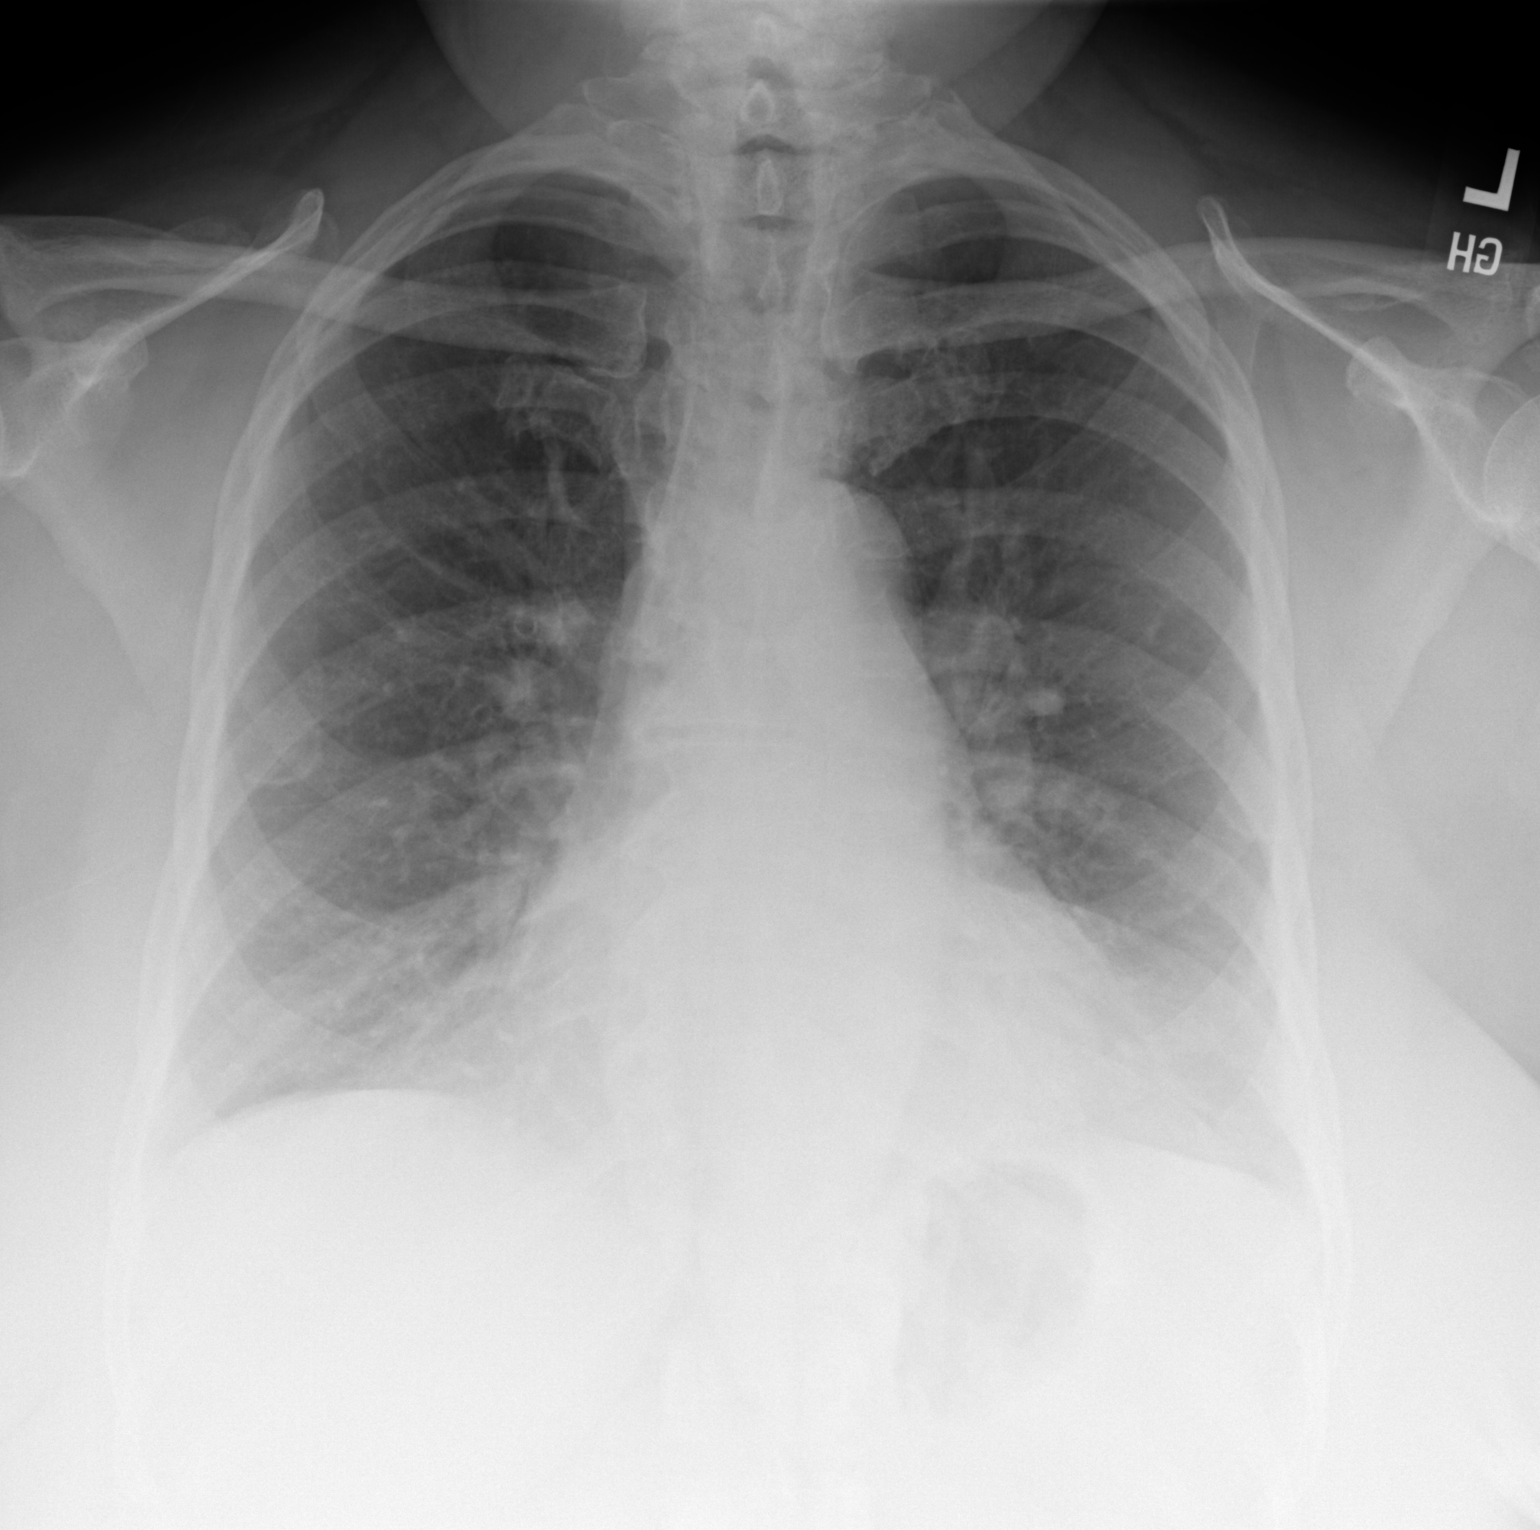

[dg chest 2 view (2 of 2)]
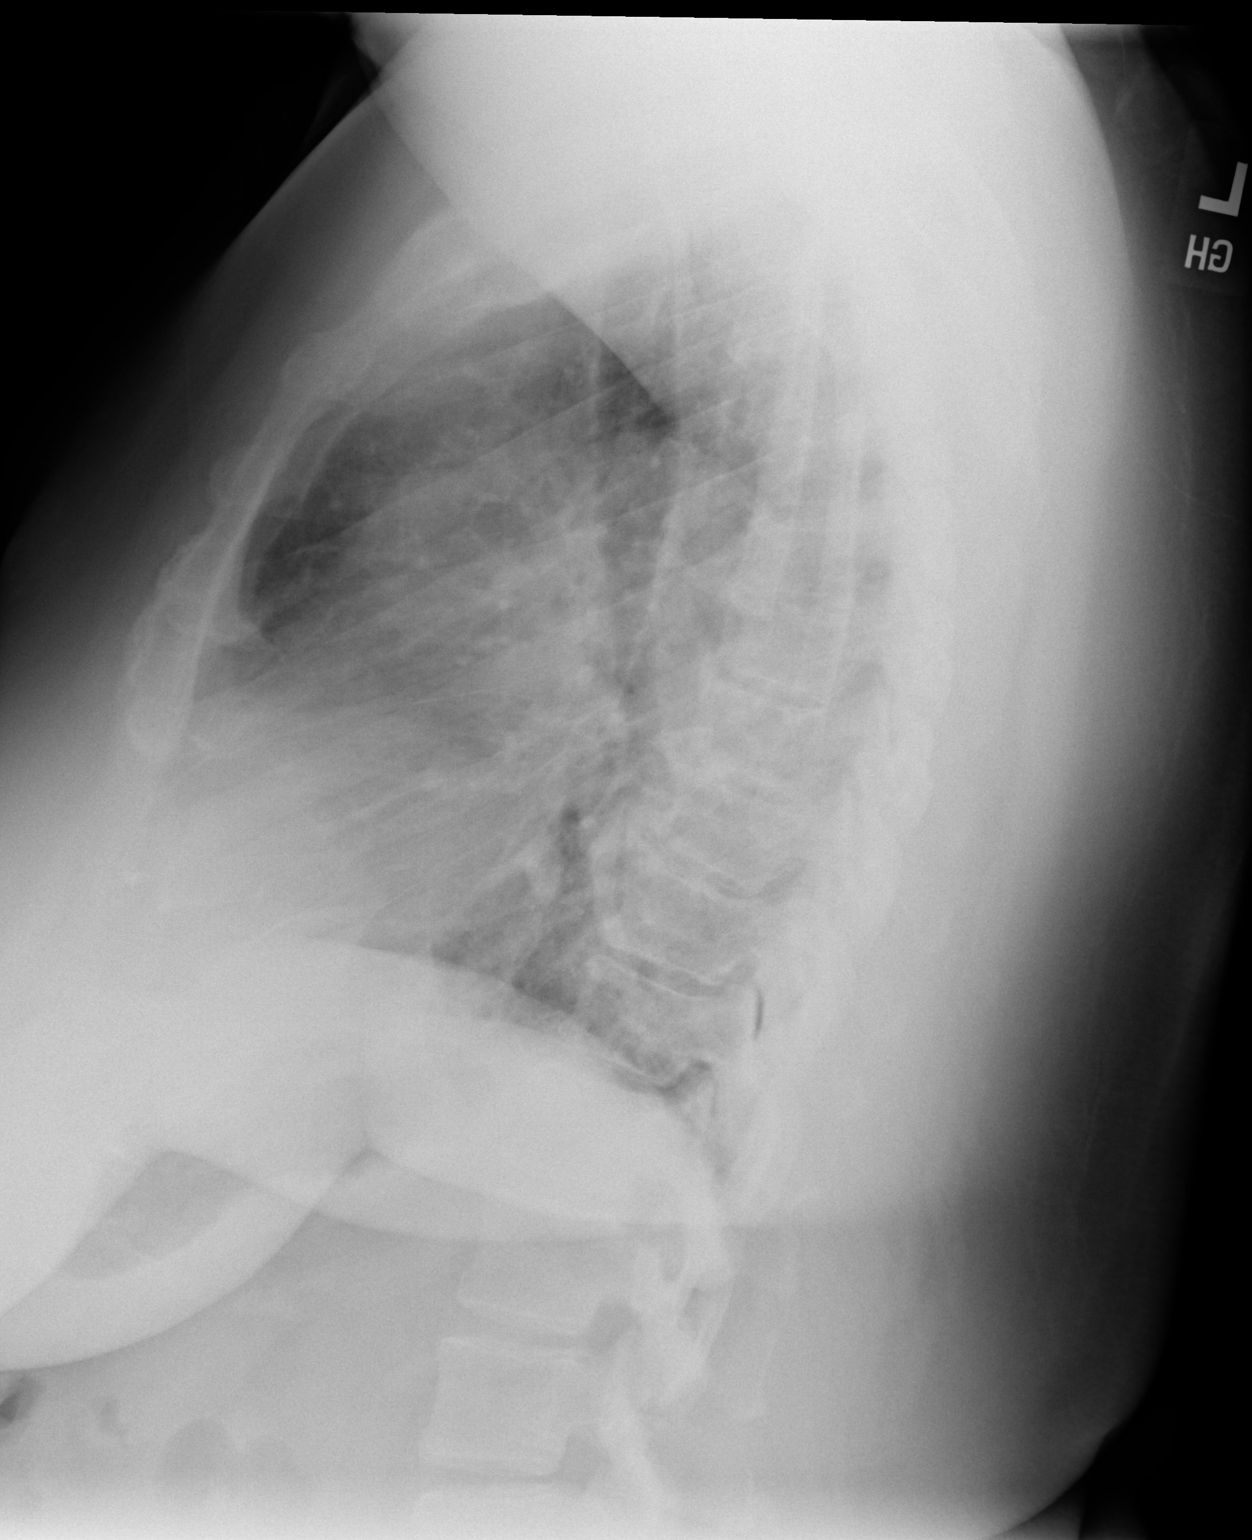

[2 of 2 positions shown; findings below may reference images not displayed]

FINDINGS: The cardiomediastinal silhouette is within normal limits. No pleural
effusion. No pneumothorax. No mass or consolidation. No acute
osseous abnormality.
IMPRESSION: No acute findings in the chest.

## 2023-03-04 IMAGING — DX DG LUMBAR SPINE 2-3V
3 series · 3 of 3 positions shown · non-contrast
Comparison: None.

CLINICAL DATA: Acute arthritis

EXAM:
LUMBAR SPINE - 2-3 VIEW

[dg lumbar spine 2-3 views (1 of 3)]
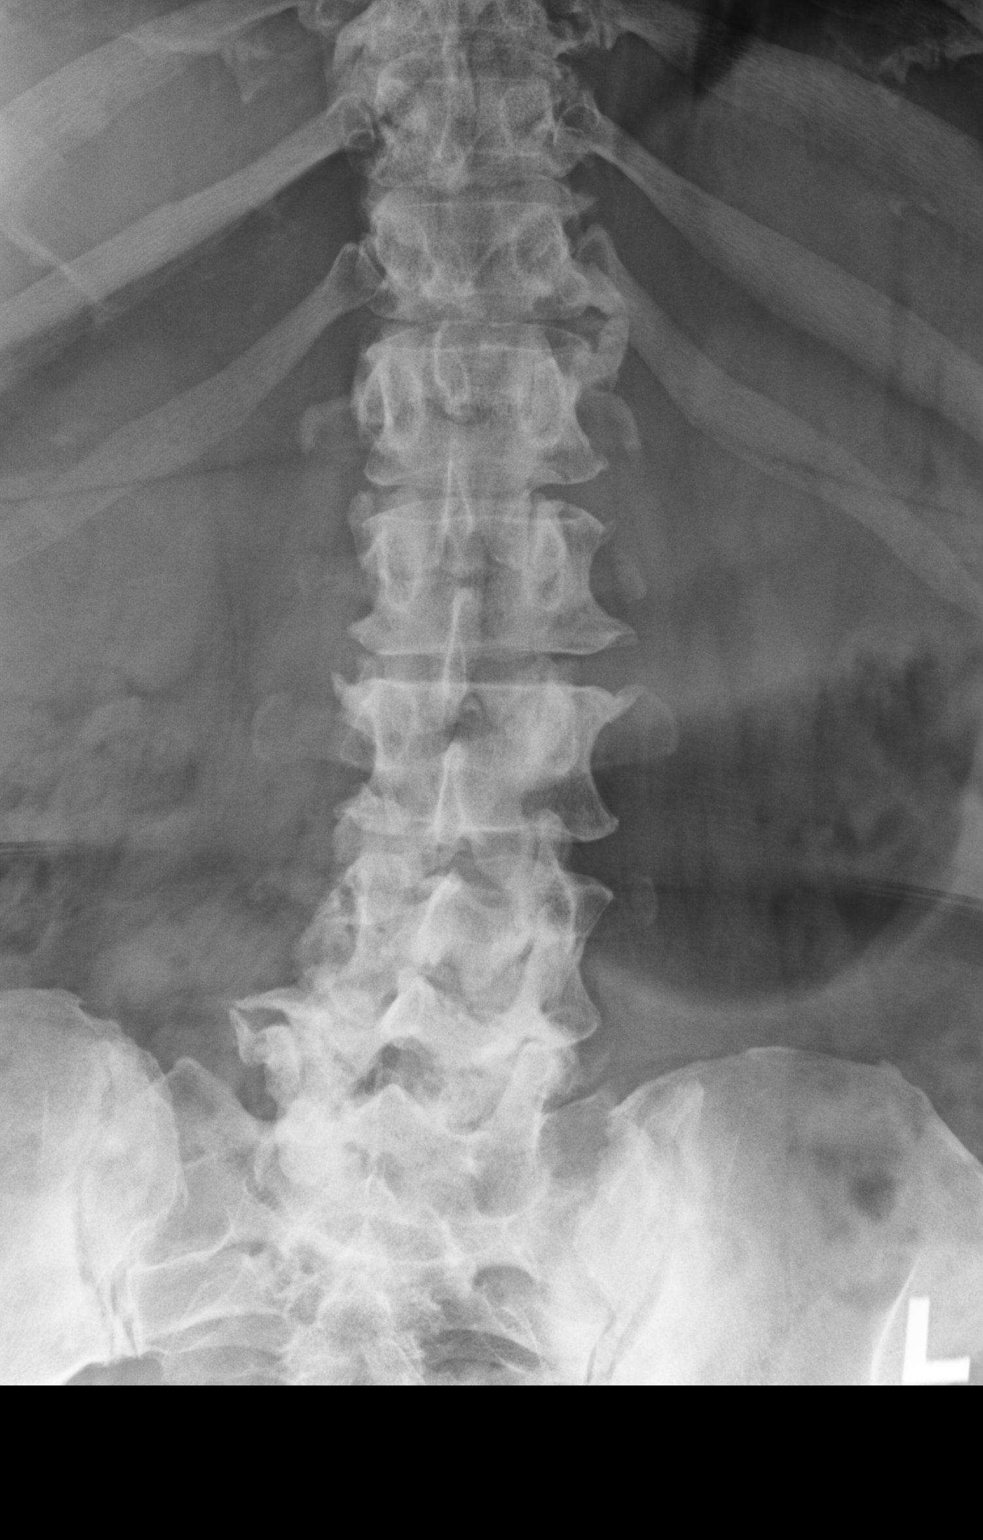

[dg lumbar spine 2-3 views (2 of 3)]
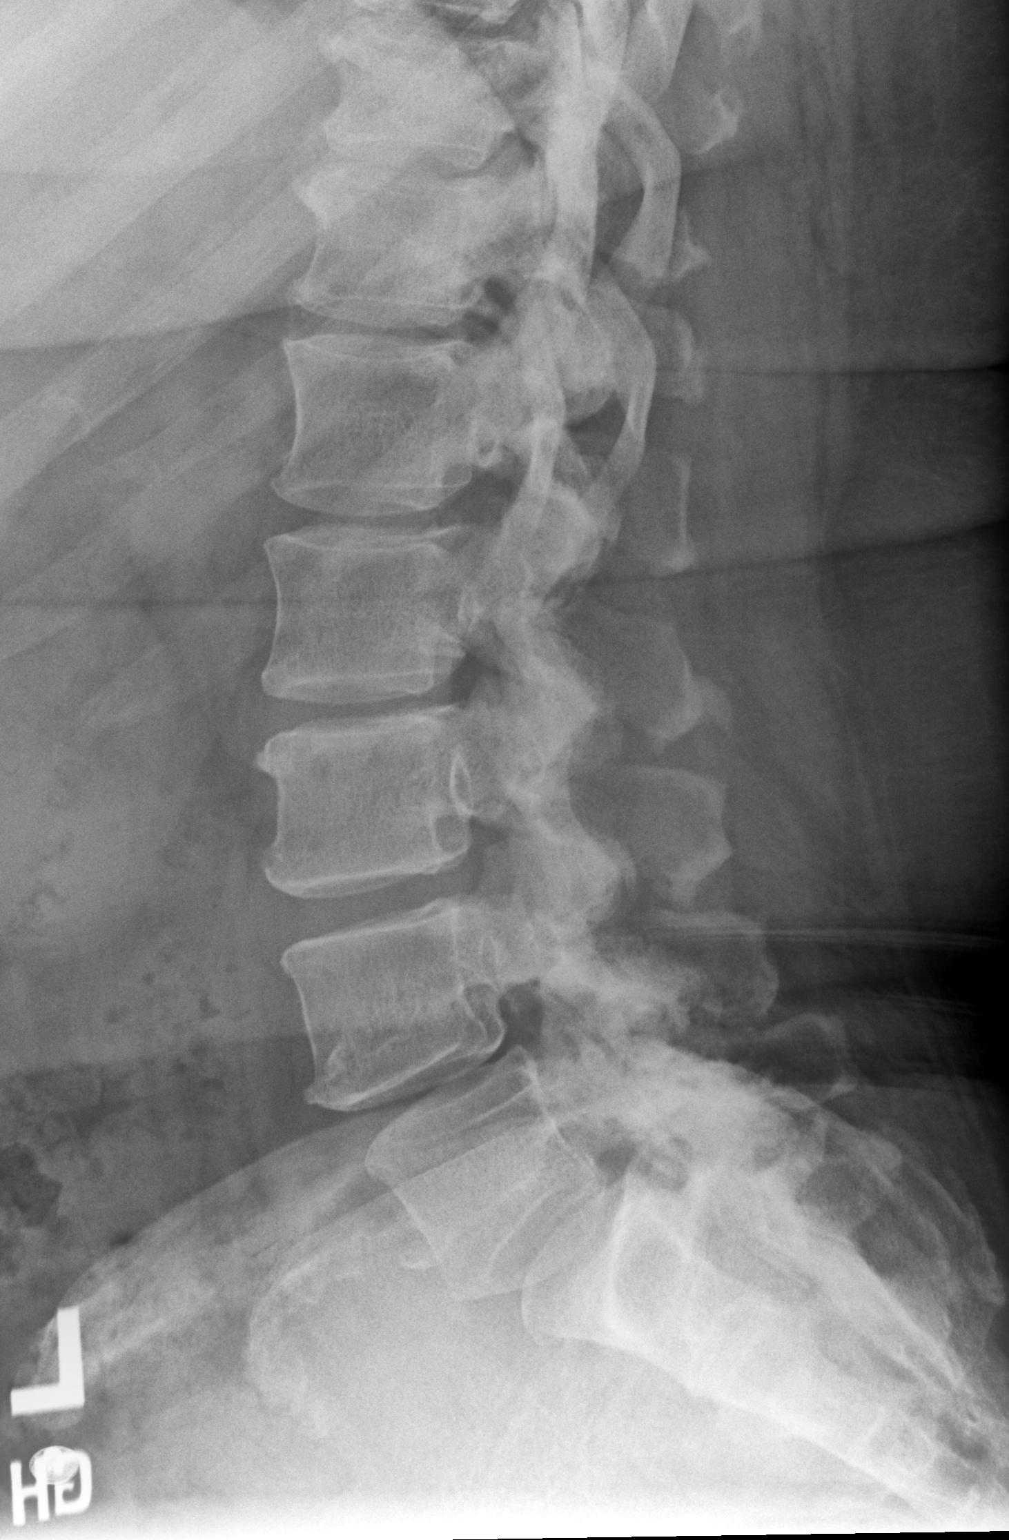

[dg lumbar spine 2-3 views (3 of 3)]
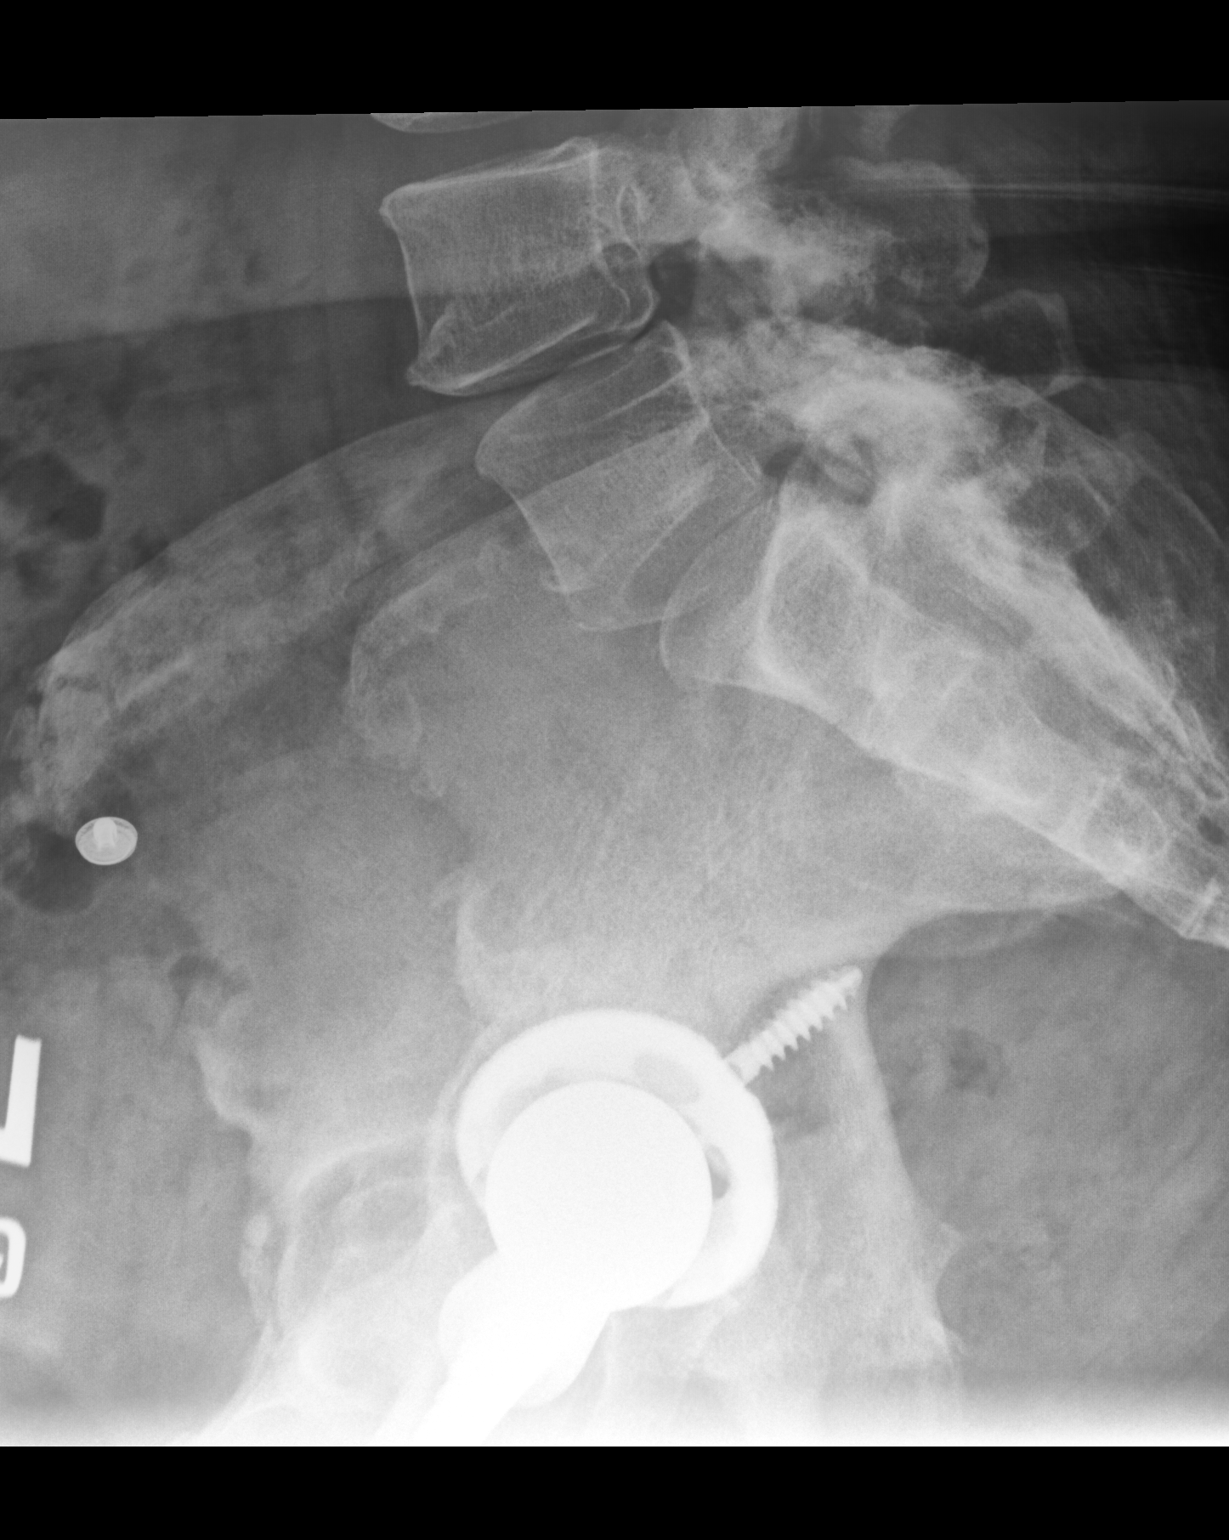

[3 of 3 positions shown; findings below may reference images not displayed]

FINDINGS: There are 5 non-rib-bearing lumbar type vertebral bodies. The lumbar
vertebral body heights are maintained without compression fracture.
There is disc space disease at L4-L5 with 3 mm of grade 1
anterolisthesis at that level. Multilevel facet arthropathy more
pronounced in the lower lumbar spine. The visualized soft tissues
are unremarkable.
IMPRESSION: Multilevel degenerative changes of the lower lumbar spine as
described including 3 mm grade 1 anterolisthesis of L4 on L5.

## 2023-03-06 ENCOUNTER — Other Ambulatory Visit (INDEPENDENT_AMBULATORY_CARE_PROVIDER_SITE_OTHER): Payer: Self-pay | Admitting: Adult Health

## 2023-03-06 DIAGNOSIS — E1165 Type 2 diabetes mellitus with hyperglycemia: Secondary | ICD-10-CM

## 2023-03-07 ENCOUNTER — Encounter (INDEPENDENT_AMBULATORY_CARE_PROVIDER_SITE_OTHER): Payer: Self-pay | Admitting: Adult Health

## 2023-03-07 ENCOUNTER — Telehealth (INDEPENDENT_AMBULATORY_CARE_PROVIDER_SITE_OTHER): Payer: Self-pay | Admitting: Adult Health

## 2023-03-07 ENCOUNTER — Ambulatory Visit (INDEPENDENT_AMBULATORY_CARE_PROVIDER_SITE_OTHER): Payer: 59 | Admitting: Adult Health

## 2023-03-07 VITALS — BP 117/71 | HR 74 | Temp 97.6°F | Ht 59.0 in | Wt 207.0 lb

## 2023-03-07 DIAGNOSIS — E66813 Obesity, class 3: Secondary | ICD-10-CM

## 2023-03-07 DIAGNOSIS — Z6841 Body Mass Index (BMI) 40.0 and over, adult: Secondary | ICD-10-CM

## 2023-03-07 DIAGNOSIS — Z7984 Long term (current) use of oral hypoglycemic drugs: Secondary | ICD-10-CM

## 2023-03-07 DIAGNOSIS — E669 Obesity, unspecified: Secondary | ICD-10-CM | POA: Diagnosis not present

## 2023-03-07 DIAGNOSIS — E1165 Type 2 diabetes mellitus with hyperglycemia: Secondary | ICD-10-CM

## 2023-03-07 DIAGNOSIS — E559 Vitamin D deficiency, unspecified: Secondary | ICD-10-CM | POA: Diagnosis not present

## 2023-03-07 DIAGNOSIS — R0789 Other chest pain: Secondary | ICD-10-CM

## 2023-03-07 MED ORDER — METFORMIN HCL 500 MG PO TABS: ORAL_TABLET | ORAL | 0 refills | Status: DC

## 2023-03-07 MED ORDER — VITAMIN D (ERGOCALCIFEROL) 1.25 MG (50000 UNIT) PO CAPS: 50000.0000 [IU] | ORAL_CAPSULE | ORAL | 0 refills | Status: DC

## 2023-03-07 MED ORDER — TIRZEPATIDE 2.5 MG/0.5ML ~~LOC~~ SOAJ
2.5000 mg | SUBCUTANEOUS | 0 refills | Status: DC
Start: 2023-03-07 — End: 2023-04-03

## 2023-03-07 NOTE — Telephone Encounter (Signed)
Patient called stating that the Vitamin D RX needs be sent to [facility]. Pt states that the vitamin always goes to [facility]. Please call pt once the Vitamin D has been sent to the pharmacy. [facility].

## 2023-03-07 NOTE — Progress Notes (Signed)
WEIGHT SUMMARY AND BIOMETRICS  No data recorded Anthropometric Measurements Height: 4\' 11"  (1.499 m) Weight at Last Visit: 206lb Starting Weight: 209lb   No data recorded Other Clinical Data Labs: no Today's Visit #: 18 Starting Date: 06/01/21    Chief Complaint:   OBESITY [name] is here to discuss her progress with her obesity treatment plan. She is on the the Category 2 Plan and states she is following her eating plan approximately 70 % of the time.  She states she is exercising Water Aerobics 60 minutes 2-3 times per week.   Interim History:  02/15/2023 ED Encounter- Atypical CP and R Knee pain 02/22/2023 OV with Cards  She denies any current cardiac sx's at present  BP excellent at OV today  She reports increased snacking on simple CHO/sugary foods  Subjective:   1. Atypical chest pain 02/15/2023 ED Encounter Notes HPI [name] is a 62 y.o. female with past medical history as below, significant for angina, HFpEF, prior PE no longer on AC, clotting disorder, IBS, joint pain, depression who presents to the ED with complaint of chest / arm pain. Right knee pain.   She had RL HC 9/18, Dr Lynnette Caffey, Mid LAD 25% stenosed, relatively nonobstructive CAD   She presents today with 1 day of chest pressure, midsternal, some radiation to left shoulder.  Has pain behind her right knee.  Headache, nausea, body aches.  Reports similar symptoms in the past that she reports were attributed to her CHF.  She has been compliant with her home medications, no unexpected weight changes, no fevers or chills, chest pain is constant, pressure sensation.  Unable to identify alleviating chest pain factors.  Change in bowel or bladder functions Symptoms resolved following Toradol.  Favor atypical chest pain.  She has troponin x 2, EKG without ischemic changes, chest x-ray unremarkable.       The patient's chest pain is not suggestive of pulmonary embolus, cardiac ischemia,  aortic dissection, pericarditis, myocarditis, pulmonary embolism, pneumothorax, pneumonia, Zoster, or esophageal perforation, or other serious etiology.  Historically not abrupt in onset, tearing or ripping, pulses symmetric. EKG nonspecific for ischemia/infarction. No dysrhythmias, brugada, WPW, prolonged QT noted.    Troponin negative x2. CXR reviewed. Labs without demonstration of acute pathology unless otherwise noted above. Low HEART Score: 0-3 points (0.9-1.7% risk of MACE).   Given the extremely low risk of these diagnoses further testing and evaluation for these possibilities does not appear to be indicated at this time. Patient in no distress and overall condition improved here in the ED. Detailed discussions were had with the patient regarding current findings, and need for close f/u with PCP or on call doctor. The patient has been instructed to return immediately if the symptoms worsen in any way for re-evaluation. Patient verbalized understanding and is in agreement with current care plan. All questions answered prior to discharge.  02/22/2023 Heart Care OV Notes  Assessment & Plan    HFpEF -euvolemic on exam today -low sodium diet -lower ext compression -daily weights and if increases by 2 lbs overnight or 5 lbs in a week we need a phone call   Shortness of breath -euvolemic on exam today -Much improved today -she has PRN lasix   Stable Angina Recent episode of chest pain with radiation to the back and associated with nausea. Comprehensive workup in the ED including CT scan, chest x-ray, and troponin was negative. Likely musculoskeletal in nature, possibly related to recent exercise. -Continue current medical  therapy for coronary artery disease.   Leg Pain Recent episode of leg pain with associated swelling. Ultrasound ruled out deep vein thrombosis. Likely musculoskeletal in nature, possibly related to recent exercise. -Conservative management with ice, heat, and elevation as  needed.   Type 2 Diabetes Mellitus Hemoglobin A1c of 7.9, indicating suboptimal glycemic control. Patient reports gastrointestinal side effects from metformin. -Consider alternative diabetes medications such as semaglutide [facility]) in consultation with [facility] team.   Weight Management Patient expresses desire to lose weight and reduce medication burden. -Continue engagement with [facility] team to explore safe and effective weight loss strategies.   2. Vitamin D deficiency  Latest Reference Range & Units 01/11/23 10:17  Vitamin D, 25-Hydroxy 30.0 - 100.0 ng/mL 33.5   She is on weekly Ergocalciferol- denies N/V/Muscle Weakness  3. Type 2 diabetes mellitus with hyperglycemia, without long-term current use of insulin (HCC) Lab Results  Component Value Date   HGBA1C 7.9 (H) 12/11/2022   HGBA1C 7.3 (H) 07/15/2022   HGBA1C 6.4 (H) 09/28/2021    Currently on daily Metformin 500mg  1/2 tab and Jardiance 10mg .  She denies family hx of MEN 2 or MTC She denies personal hx of pancreatitis  Previously on Ozempic therapy- significant GI upset weight gain  Discussed risks/benefits of GIP/GLP-1 therapy  Fasting CBG range 112-160 Normally 110-120s  Assessment/Plan:   1. Atypical chest pain Continue all current medications as directed  2. Vitamin D deficiency Refill Vitamin D, Ergocalciferol, (DRISDOL) 1.25 MG (50000 UNIT) CAPS capsule Take 1 capsule (50,000 Units total) by mouth every 7 (seven) days. Dispense: 4 capsule, Refills: 0 ordered   3. Type 2 diabetes mellitus with hyperglycemia, without long-term current use of insulin (HCC) Refill metFORMIN (GLUCOPHAGE) 500 MG tablet 1/2 tab daily with breakfast Dispense: 30 tablet, Refills: 0 ordered   Start tirzepatide [facility]) 2.5 MG/0.5ML Pen Inject 2.5 mg into the skin once a week. Dispense: 2 mL, Refills: 0 ordered   4. Obesity, current BMI of 41.58  [name] is currently in the action stage  of change. As such, her goal is to continue with weight loss efforts. She has agreed to the Category 2 Plan.   Exercise goals: All adults should avoid inactivity. Some physical activity is better than none, and adults who participate in any amount of physical activity gain some health benefits. Adults should also include muscle-strengthening activities that involve all major muscle groups on 2 or more days a week.  Behavioral modification strategies: increasing lean protein intake, decreasing simple carbohydrates, increasing vegetables, increasing water intake, no skipping meals, meal planning and cooking strategies, keeping healthy foods in the home, and planning for success.  [name] has agreed to follow-up with our clinic in 4 weeks. She was informed of the importance of frequent follow-up visits to maximize her success with intensive lifestyle modifications for her multiple health conditions.   Objective:   Height 4\' 11"  (1.499 m). Body mass index is 42.54 kg/m.  General: Cooperative, alert, well developed, in no acute distress. HEENT: Conjunctivae and lids unremarkable. Cardiovascular: Regular rhythm.  Lungs: Normal work of breathing. Neurologic: No focal deficits.   Lab Results  Component Value Date   CREATININE 0.80 02/15/2023   BUN 15 02/15/2023   NA 141 02/15/2023   K 3.7 02/15/2023   CL 108 02/15/2023   CO2 23 02/15/2023   Lab Results  Component Value Date   ALT 24 10/15/2022   AST 19 10/15/2022   ALKPHOS 58 10/15/2022  BILITOT 0.6 10/15/2022   Lab Results  Component Value Date   HGBA1C 7.9 (H) 12/11/2022   HGBA1C 7.3 (H) 07/15/2022   HGBA1C 6.4 (H) 09/28/2021   HGBA1C 8.4 (H) 03/08/2021   HGBA1C 7.6 (H) 02/03/2020   Lab Results  Component Value Date   INSULIN 13.4 08/14/2022   INSULIN 22.1 09/28/2021   INSULIN 26.8 (H) 06/01/2021   INSULIN 44.4 (H) 02/03/2020   INSULIN 28.1 (H) 12/26/2018   Lab Results  Component Value Date   TSH 0.746 11/07/2022    Lab Results  Component Value Date   CHOL 127 07/16/2022   HDL 48 07/16/2022   LDLCALC 55 07/16/2022   TRIG 119 07/16/2022   CHOLHDL 2.6 07/16/2022   Lab Results  Component Value Date   VD25OH 33.5 01/11/2023   VD25OH 30.3 08/14/2022   VD25OH 39.8 09/28/2021   Lab Results  Component Value Date   WBC 5.4 02/15/2023   HGB 13.4 02/15/2023   HCT 40.1 02/15/2023   MCV 90.9 02/15/2023   PLT 236 02/15/2023   No results found for: "IRON", "TIBC", "FERRITIN"  Attestation Statements:   Reviewed by clinician on day of visit: allergies, medications, problem list, medical history, surgical history, family history, social history, and previous encounter notes.  I have reviewed the above documentation for accuracy and completeness, and I agree with the above. -  Lorelee Mclaurin d. Josiah Wojtaszek, NP-C

## 2023-03-07 NOTE — Telephone Encounter (Signed)
Rx sent to [facility].  Okay to send per William Hamburger, NP.

## 2023-03-14 NOTE — Progress Notes (Signed)
PREP 11/20/22 to 02/26/23 Attended a total 16 sessions, 8 educational  Did not return for fit testing, surveys or final measurements.

## 2023-03-20 ENCOUNTER — Telehealth: Payer: Self-pay

## 2023-03-20 NOTE — Telephone Encounter (Signed)
Received call from pt.  Does want to do final measurements. Prefers next week. Scheduled for 03/26/23 at 11am at [facility]. Pt sts she will notify me if anything changes.

## 2023-03-26 ENCOUNTER — Telehealth: Payer: Self-pay

## 2023-03-26 NOTE — Progress Notes (Signed)
[facility] PREP Evaluation  Patient Details  Name: [name] MRN: [id number] Date of Birth: 08-29-1960 Age: 62 y.o. PCP: Raymon Mutton., FNP  Vitals:   03/26/23 1100  BP: 124/64  Pulse: 76  SpO2: 96%  Weight: 207 lb (93.9 kg)     [facility] Eval - 03/26/23 1400       [facility] "PREP" Location   [facility] "PREP" Location [facility]      Referral    Referring Provider Katrinka Blazing    Program Start Date 11/20/22    Program End Date 02/26/23      Measurement   Waist Circumference 48 inches    Waist Circumference End Program 48 inches    Hip Circumference 49 inches    Hip Circumference End Program 48 inches    Body fat 48.5 percent      Information for Trainer   Goals Plans on continuing to exercise      Mobility and Daily Activities   I find it easy to walk up or down two or more flights of stairs. 2    I have no trouble taking out the trash. 4    I do housework such as vacuuming and dusting on my own without difficulty. 3    I can easily lift a gallon of milk (8lbs). 2    I can easily walk a mile. 2    I have no trouble reaching into high cupboards or reaching down to pick up something from the floor. 2    I do not have trouble doing out-door work such as Loss adjuster, chartered, raking leaves, or gardening. 2      Mobility and Daily Activities   I feel younger than my age. 2    I feel independent. 3    I feel energetic. 2    I live an active life.  3    I feel strong. 3    I feel healthy. 3    I feel active as other people my age. 3      How fit and strong are you.   Fit and Strong Total Score 36            Past Medical History:  Diagnosis Date   Allergy    Anginal pain (HCC)    a. NL cath in 2008;  b. Myoview 03/2011: dec uptake along mid anterior wall on stress imaging -> ? attenuation vs. ischemia, EF 65%;  c. 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 2008 -> coumadin x 6 mos.   Rheumatoid arthritis (HCC)    Sleep apnea    on CPAP 02/2018   SOB (shortness of breath)    Thyroid disease    TIA (transient ischemic attack)    Urinary urgency    Vitamin D deficiency    Past Surgical History:  Procedure Laterality Date   ABDOMINAL HYSTERECTOMY  2005   APPENDECTOMY     BREAST LUMPECTOMY WITH RADIOACTIVE SEED AND SENTINEL LYMPH NODE BIOPSY Right 11/16/2021   Procedure: RIGHT BREAST RADIOACTIVE SEED LOCALIZED LUMPECTOMY WITH RIGHT AXILLARY SENTINEL LYMPH NODE BIOPSY;  Surgeon: Manus Rudd, MD;  Location: [facility];  Service: General;  Laterality: Right;  LMA & PEC BLOCK   CARDIAC CATHETERIZATION     Normal   CARDIAC CATHETERIZATION N/A 09/30/2014   Procedure: Left Heart Cath and Coronary Angiography;  Surgeon: Tonny Bollman, MD;  Location: [facility] INVASIVE CV LAB;  Service: Cardiovascular;  Laterality: N/A;   LAPAROSCOPIC APPENDECTOMY N/A 06/03/2012   Procedure: APPENDECTOMY LAPAROSCOPIC;  Surgeon: Almond Lint, MD;  Location: [facility] OR;  Service: General;  Laterality: N/A;   Left knee surgery  2008   LEG SURGERY     RIGHT/LEFT HEART CATH AND CORONARY ANGIOGRAPHY N/A 01/10/2023   Procedure: RIGHT/LEFT HEART CATH AND CORONARY ANGIOGRAPHY;  Surgeon: Orbie Pyo, MD;  Location: [facility] INVASIVE CV LAB;  Service: Cardiovascular;  Laterality: N/A;    TONSILLECTOMY     TOTAL HIP ARTHROPLASTY Left 06/11/2018   Procedure: LEFT TOTAL HIP ARTHROPLASTY ANTERIOR APPROACH;  Surgeon: Cammy Copa, MD;  Location: [facility] OR;  Service: Orthopedics;  Laterality: Left;   TOTAL KNEE ARTHROPLASTY Left 08/22/2016   Procedure: TOTAL KNEE ARTHROPLASTY;  Surgeon: Cammy Copa, MD;  Location: [facility] OR;  Service: Orthopedics;  Laterality: Left;   TUBAL LIGATION  1989   Attended 16 sessions/8 educational Fit testing Cardio march: 220 to 252 Sit to stand: 17 to 14 Bicep curls: 17 to 20 Recommended continued balance work as pt has difficulty with one legged balance.  Reviewed exercise to continue as well as encouraged using the fit testing as a circuit at home on days when pt doesn't want to come to the gym.     Bonnye Fava 03/26/2023, 2:49 PM

## 2023-04-03 ENCOUNTER — Encounter (INDEPENDENT_AMBULATORY_CARE_PROVIDER_SITE_OTHER): Payer: Self-pay | Admitting: Physician Assistant

## 2023-04-03 ENCOUNTER — Ambulatory Visit (INDEPENDENT_AMBULATORY_CARE_PROVIDER_SITE_OTHER): Payer: 59 | Admitting: Physician Assistant

## 2023-04-03 VITALS — BP 125/75 | HR 72 | Temp 97.7°F | Ht 59.0 in | Wt 203.0 lb

## 2023-04-03 DIAGNOSIS — Z7984 Long term (current) use of oral hypoglycemic drugs: Secondary | ICD-10-CM

## 2023-04-03 DIAGNOSIS — E1169 Type 2 diabetes mellitus with other specified complication: Secondary | ICD-10-CM

## 2023-04-03 DIAGNOSIS — E785 Hyperlipidemia, unspecified: Secondary | ICD-10-CM

## 2023-04-03 DIAGNOSIS — F32A Depression, unspecified: Secondary | ICD-10-CM

## 2023-04-03 DIAGNOSIS — E669 Obesity, unspecified: Secondary | ICD-10-CM

## 2023-04-03 DIAGNOSIS — E559 Vitamin D deficiency, unspecified: Secondary | ICD-10-CM

## 2023-04-03 DIAGNOSIS — Z7985 Long-term (current) use of injectable non-insulin antidiabetic drugs: Secondary | ICD-10-CM

## 2023-04-03 DIAGNOSIS — E1165 Type 2 diabetes mellitus with hyperglycemia: Secondary | ICD-10-CM | POA: Diagnosis not present

## 2023-04-03 DIAGNOSIS — E1159 Type 2 diabetes mellitus with other circulatory complications: Secondary | ICD-10-CM

## 2023-04-03 DIAGNOSIS — F419 Anxiety disorder, unspecified: Secondary | ICD-10-CM

## 2023-04-03 DIAGNOSIS — Z6841 Body Mass Index (BMI) 40.0 and over, adult: Secondary | ICD-10-CM

## 2023-04-03 MED ORDER — METFORMIN HCL 500 MG PO TABS: ORAL_TABLET | ORAL | 0 refills | Status: DC

## 2023-04-03 MED ORDER — TIRZEPATIDE 2.5 MG/0.5ML ~~LOC~~ SOAJ: 2.5000 mg | SUBCUTANEOUS | 0 refills | Status: DC

## 2023-04-03 MED ORDER — VITAMIN D (ERGOCALCIFEROL) 1.25 MG (50000 UNIT) PO CAPS: 50000.0000 [IU] | ORAL_CAPSULE | ORAL | 0 refills | Status: DC

## 2023-04-03 MED ORDER — SERTRALINE HCL 25 MG PO TABS: 25.0000 mg | ORAL_TABLET | Freq: Every day | ORAL | 0 refills | Status: DC

## 2023-04-03 NOTE — Progress Notes (Unsigned)
SUBJECTIVE:  Chief Complaint: Obesity  Interim History: She is down 4 lbs since her last visit.  Down 6 lbs overall  TBW loss of 2.9%   [name], a 62 year old patient with a history of obesity, type 2 diabetes, vitamin D deficiency, hyperlipidemia, and hypertension, presents for a follow-up visit regarding her obesity treatment plan. The patient recently started on Lonjaro and reports a perceived improvement. However, she expresses concern about unexplained bruising in various locations, not limited to the injection site. The patient denies taking any blood thinners other than aspirin.  The patient also reports some confusion about the administration of the Mounjaro injections, specifically the number of clicks heard during the injection process. She is unsure if she is receiving the full dose due to only hearing one click instead of two. Despite this, the patient has lost weight since starting the medication.  The patient also mentions a single episode of feeling as if her blood sugar had dropped, but upon checking, the blood glucose level was within normal limits. The patient managed the episode by eating an apple.  The patient also shares her recent completion of an exercise program at a local facility and expresses interest in continuing physical activity and we discussed a free low-impact aerobics class offered by the local [facility] and Recreation department- [facility].  The patient also shares personal experiences of loss and coping mechanisms, including the recent passing of a close friend and the impact of this loss on her holiday plans. She also mentions providing support to another friend who is a hospice patient with cancer.  [name] is here to discuss her progress with her obesity treatment plan. She is on the Category 2 Plan and states she is following her eating plan approximately 80 % of the time. She states she is exercising water aerobics 45 minutes 2 times per  week.   Pharmacotherapy: Previously on Ozempic therapy- significant GI upset weight gain     Started Mounjaro 2.5 mg weekly 03/07/2023 OBJECTIVE: Visit Diagnoses: Problem List Items Addressed This Visit     Type 2 diabetes mellitus with hyperglycemia, without long-term current use of insulin (HCC) - Primary   Relevant Medications   tirzepatide (MOUNJARO) 2.5 MG/0.5ML Pen   metFORMIN (GLUCOPHAGE) 500 MG tablet   Vitamin D deficiency   Relevant Medications   Vitamin D, Ergocalciferol, (DRISDOL) 1.25 MG (50000 UNIT) CAPS capsule   Other Visit Diagnoses     Hyperlipidemia associated with type 2 diabetes mellitus (HCC)       Relevant Medications   tirzepatide (MOUNJARO) 2.5 MG/0.5ML Pen   metFORMIN (GLUCOPHAGE) 500 MG tablet   Hypertension associated with diabetes (HCC)       Relevant Medications   tirzepatide (MOUNJARO) 2.5 MG/0.5ML Pen   metFORMIN (GLUCOPHAGE) 500 MG tablet   Anxiety and depression       Relevant Medications   sertraline (ZOLOFT) 25 MG tablet   Obesity with starting BMI of 42.4       Relevant Medications   tirzepatide (MOUNJARO) 2.5 MG/0.5ML Pen   metFORMIN (GLUCOPHAGE) 500 MG tablet   BMI 40.0-44.9, adult (HCC) Current BMI 41.1       Relevant Medications   tirzepatide (MOUNJARO) 2.5 MG/0.5ML Pen   metFORMIN (GLUCOPHAGE) 500 MG tablet     Obesity Reports bruising at injection sites and concerns about proper administration of Mounjaro. Weight loss of 4 pounds over the past month, total of 6 pounds. No significant side effects except transient hypoglycemia. Discussed proper injection technique, ensuring  two clicks are heard. Explained minor bruising can occur, especially with aspirin. Advised to monitor for persistent or unusual bruising and consult if it continues. Emphasized accurate administration for efficacy. Prefers to continue [facility]. - Continue Mounjaro 2.5 mg for another month - Educate on proper injection technique, ensuring two clicks are heard -  Send Mounjaro prescription to [facility]  Type 2 Diabetes Mellitus No significant hypoglycemia episodes. Blood glucose levels monitored, recent reading of 104 mg/dL. Discussed transient hypoglycemia and advised regular monitoring. Prefers to continue current management. - Refill metformin prescription and send to [facility]  Hypertension Well-managed. No new symptoms or concerns. Prefers to continue current management.  Hyperlipidemia Well-managed. No new symptoms or concerns. Prefers to continue current management.  Vitamin D Deficiency No new symptoms. Prefers to continue current management. - Refill vitamin D prescription and send to [facility] pharmacy  General Health Maintenance Completed an exercise program at the [facility] and is looking for new exercise opportunities. Discussed the importance of maintaining physical activity. Provided information on free low-impact aerobics classes through [employer] and Recreation. - Provide information on free low-impact aerobics classes through [employer] and [employer]  Follow-up - Schedule next follow-up appointment in four weeks.  Vitals Temp: 97.7 F (36.5 C) BP: 125/75 Pulse Rate: 72 SpO2: 98 %   Anthropometric Measurements Height: 4\' 11"  (1.499 m) Weight: 203 lb (92.1 kg) BMI (Calculated): 40.98 Weight at Last Visit: 207lb Weight Lost Since Last Visit: 4lb Weight Gained Since Last Visit: 0 Starting Weight: 209lb Total Weight Loss (lbs): 6 lb (2.722 kg)   Body Composition  Body Fat %: 49.2 % Fat Mass (lbs): 100 lbs Muscle Mass (lbs): 98.2 lbs Total Body Water (lbs): 76.2 lbs Visceral Fat Rating : 16   Other Clinical Data Fasting: no Labs: no Today's Visit #: 19 Starting Date: 06/01/21     ASSESSMENT AND PLAN:  Diet: [name] is currently in the action stage of change. As such, her goal is to continue with weight loss efforts. She has agreed to Category 2 Plan.  Exercise: [name] has been  instructed to continue exercising as is and Try a [employer] and Recreation free [employer] and provided schedules for various local recreation centers  for weight loss and overall health benefits.   Behavior Modification:  We discussed the following Behavioral Modification Strategies today: increasing lean protein intake, decreasing simple carbohydrates, increasing vegetables, increase H2O intake, increase high fiber foods, no skipping meals, meal planning and cooking strategies, holiday eating strategies, and planning for success. We discussed various medication options to help [name] with her weight loss efforts and we both agreed to continue [facility] for Type 2 diabetes management.  Return in about 4 weeks (around 05/01/2023).[name] She was informed of the importance of frequent follow up visits to maximize her success with intensive lifestyle modifications for her multiple health conditions.  Attestation Statements:   Reviewed by clinician on day of visit: allergies, medications, problem list, medical history, surgical history, family history, social history, and previous encounter notes.   Time spent on visit including pre-visit chart review and post-visit care and charting was 43 minutes.    Roshun Klingensmith, PA-C

## 2023-04-04 ENCOUNTER — Ambulatory Visit (INDEPENDENT_AMBULATORY_CARE_PROVIDER_SITE_OTHER): Payer: 59 | Admitting: Adult Health

## 2023-04-05 IMAGING — DX DG FOOT COMPLETE 3+V*L*
3 series · 3 of 3 positions shown · non-contrast
Comparison: None.

CLINICAL DATA: Glass pulse shattered, concern for retained foreign
body, abrasions

EXAM:
LEFT FOOT - COMPLETE 3+ VIEW; RIGHT FOOT COMPLETE - 3+ VIEW

[foot ap]
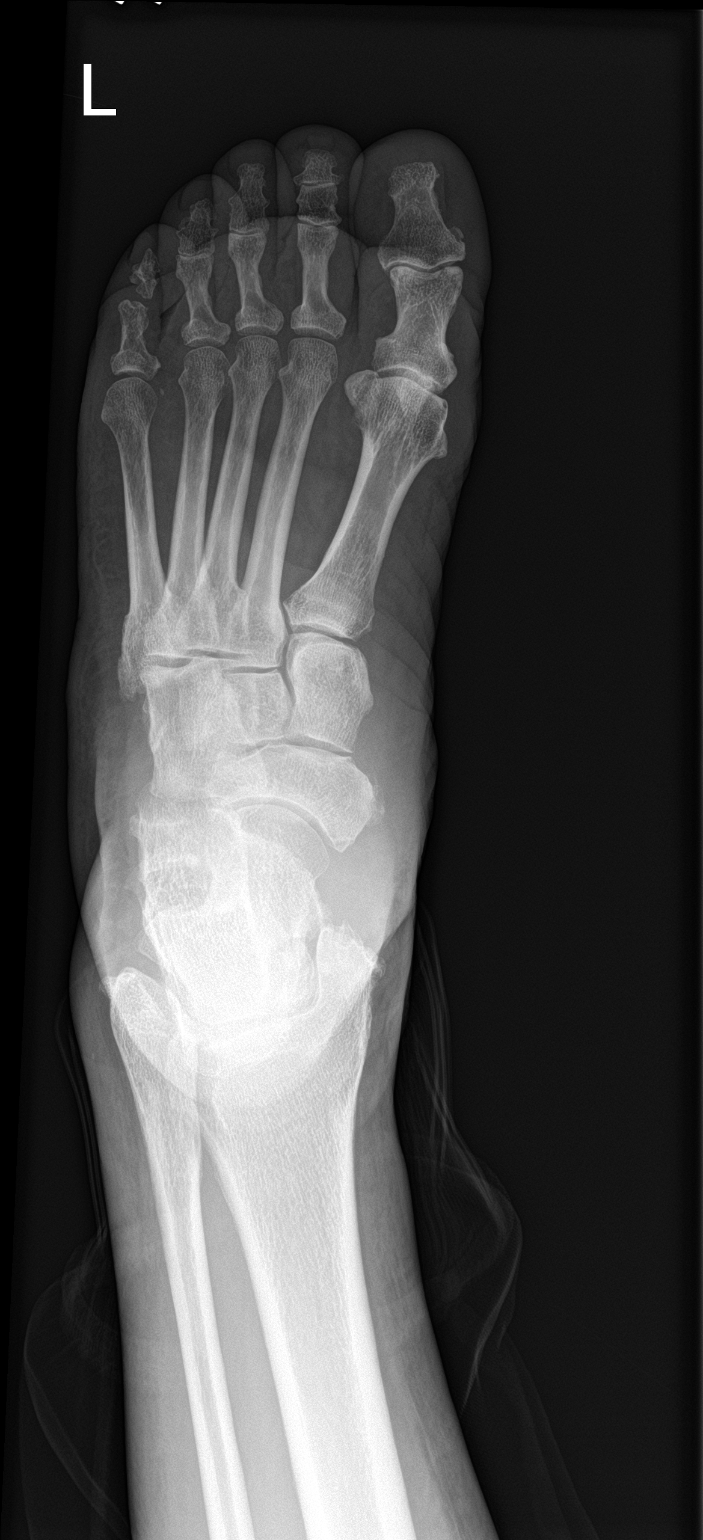

[foot obl]
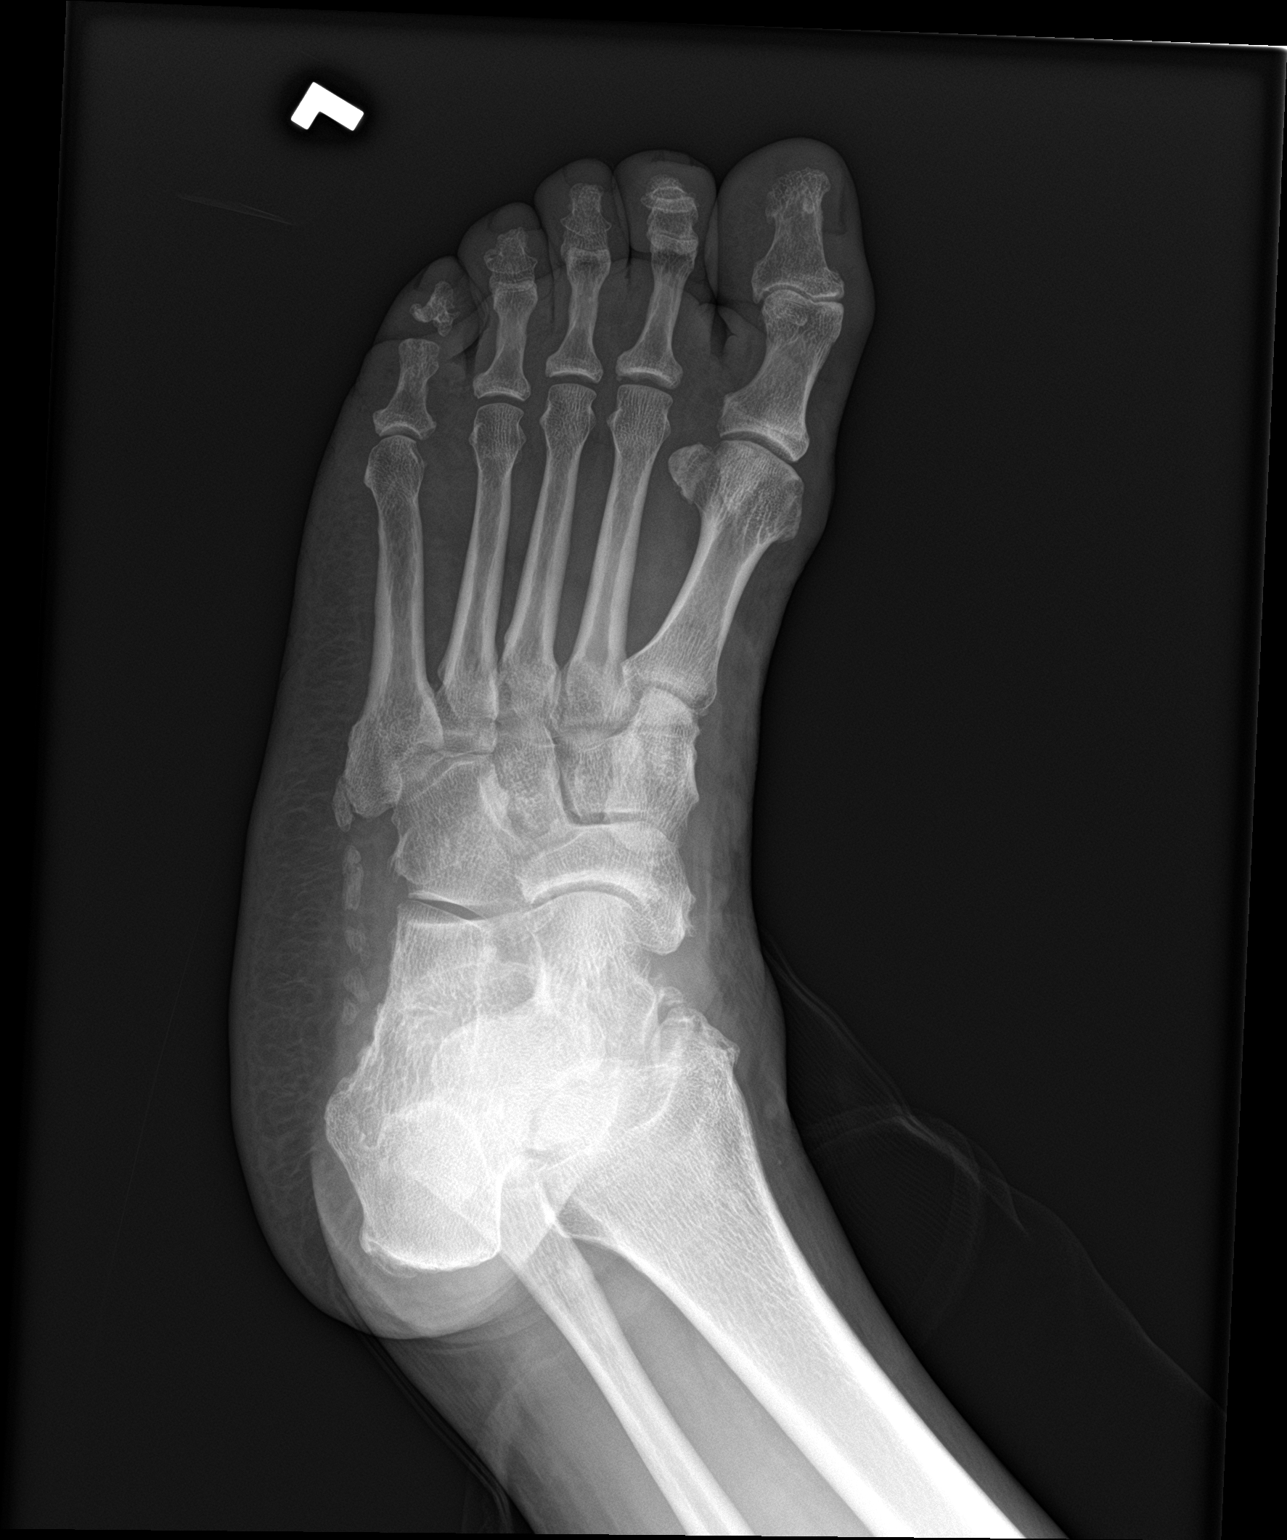

[foot lat]
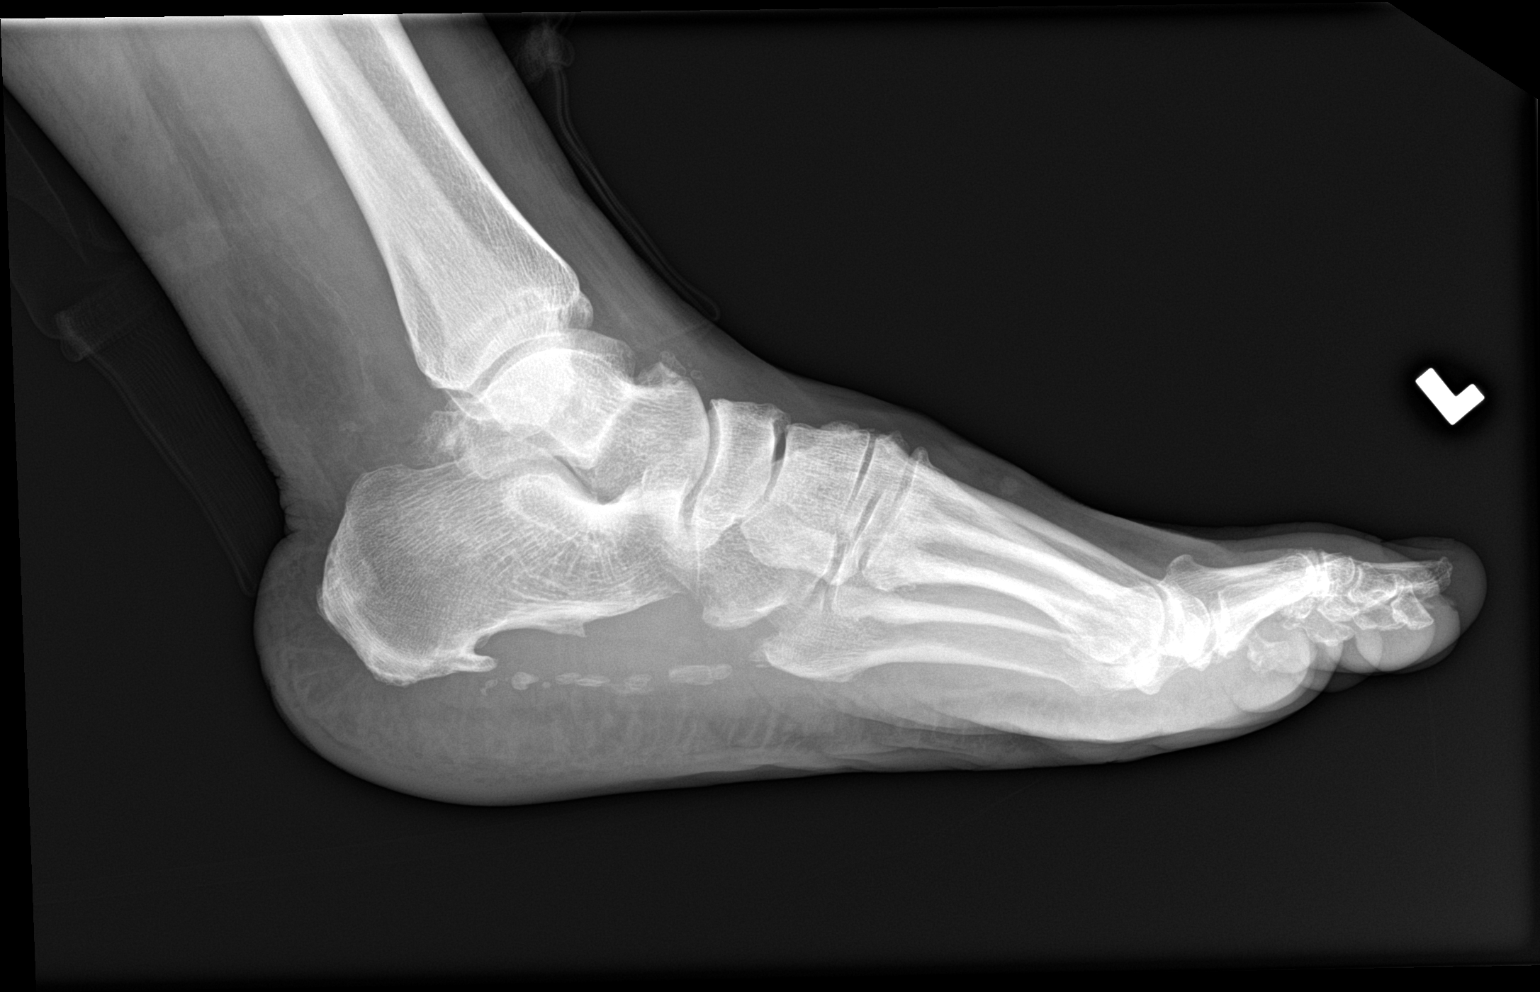

[3 of 3 positions shown; findings below may reference images not displayed]

FINDINGS: Left foot: Frontal, oblique, and lateral views are obtained. No
fracture, subluxation, or dislocation. Either resorption or
resection of the fifth middle phalanx. Prominent inferior calcaneal
spur. Calcifications within the plantar fascia. Mild diffuse
osteoarthritis greatest in the midfoot. Soft tissues are
unremarkable. No radiopaque foreign body.

Right foot: Frontal, oblique, lateral views are obtained. No acute
fracture, subluxation, or dislocation. Mild diffuse osteoarthritis
greatest in the midfoot. Prominent inferior calcaneal spur.
Calcifications within the plantar fascia. Soft tissues are
unremarkable. No evidence of radiopaque foreign body.
IMPRESSION: 1. No evidence of radiopaque foreign body within either foot.
2. Bilateral osteoarthritis.
3. Prominent inferior calcaneal spurs bilaterally, with
calcifications of the plantar fascia.

## 2023-04-05 IMAGING — DX DG FOOT COMPLETE 3+V*R*
3 series · 3 of 3 positions shown · non-contrast
Comparison: None.

CLINICAL DATA: Glass pulse shattered, concern for retained foreign
body, abrasions

EXAM:
LEFT FOOT - COMPLETE 3+ VIEW; RIGHT FOOT COMPLETE - 3+ VIEW

[foot ap]
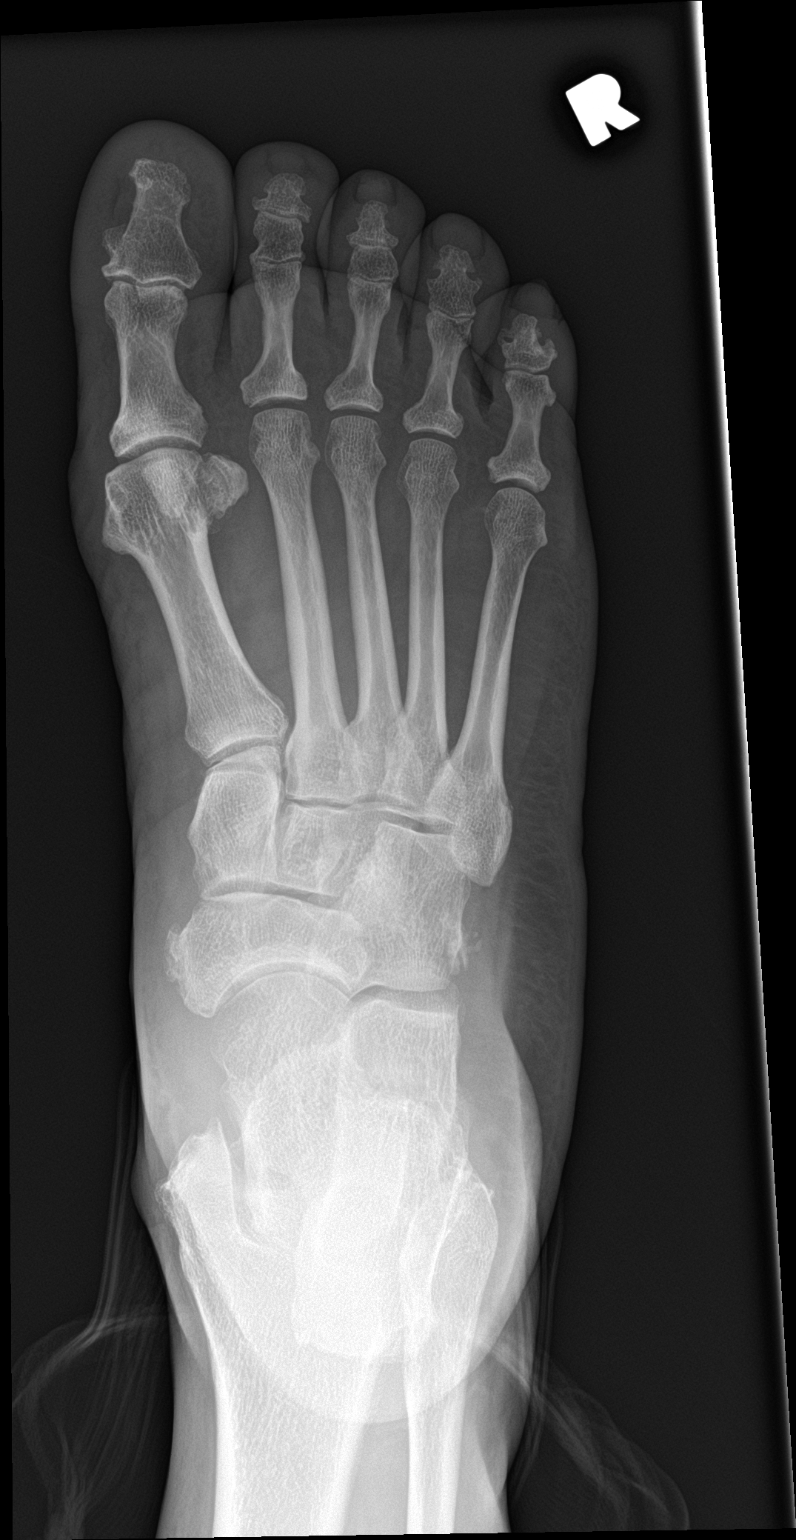

[foot obl]
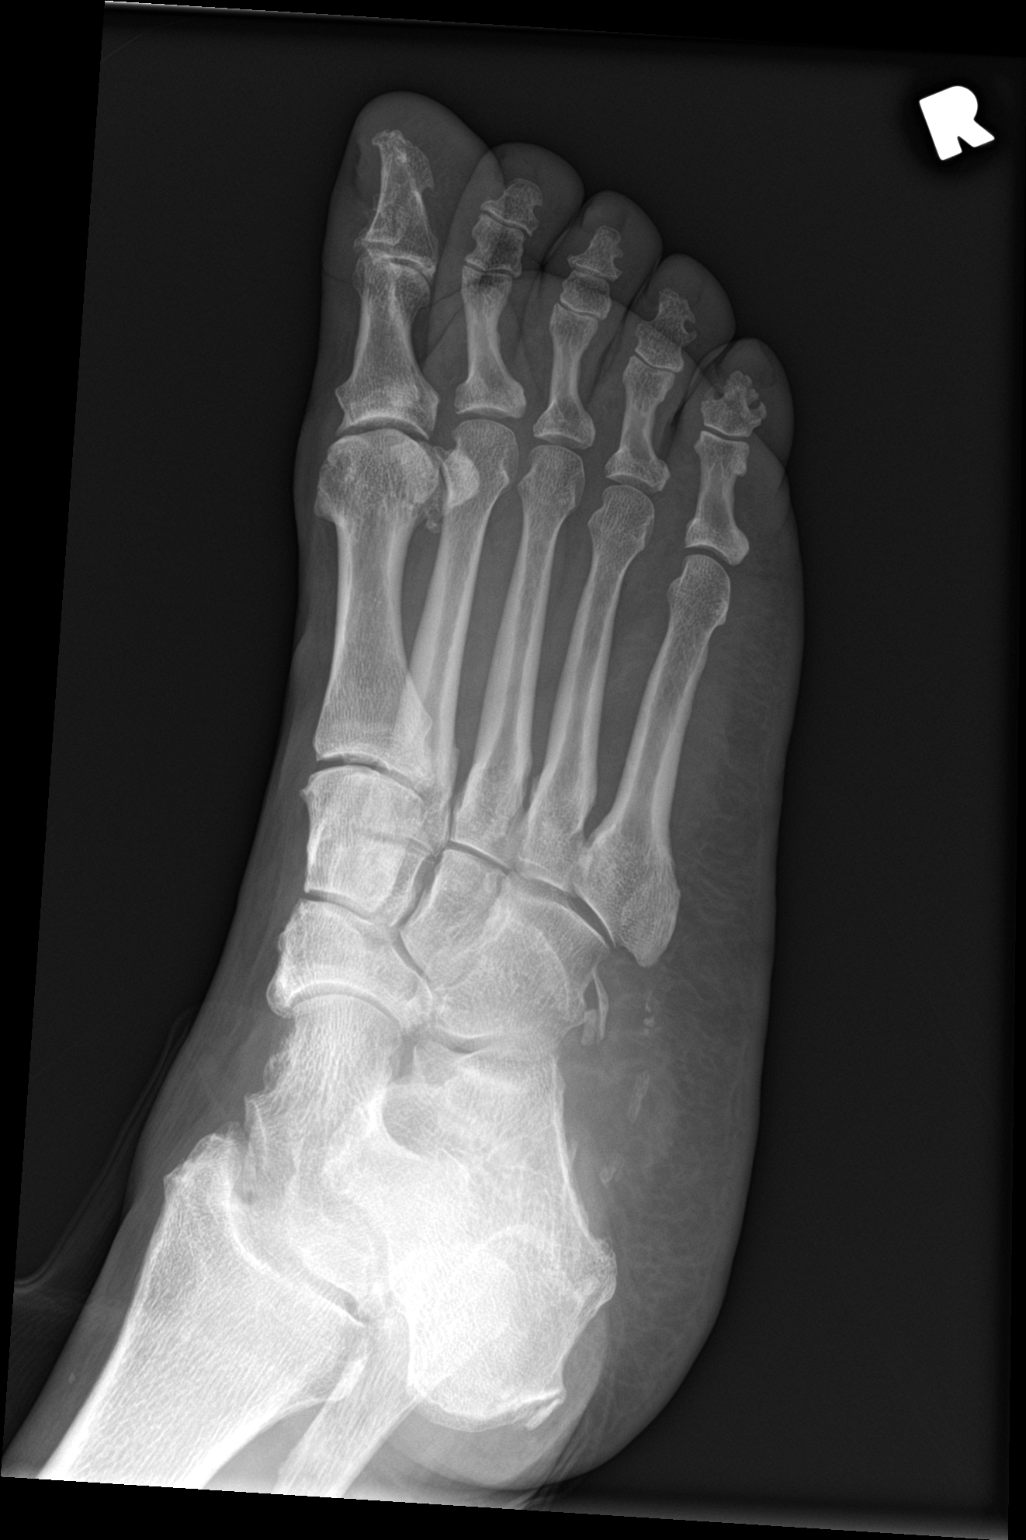

[foot lat]
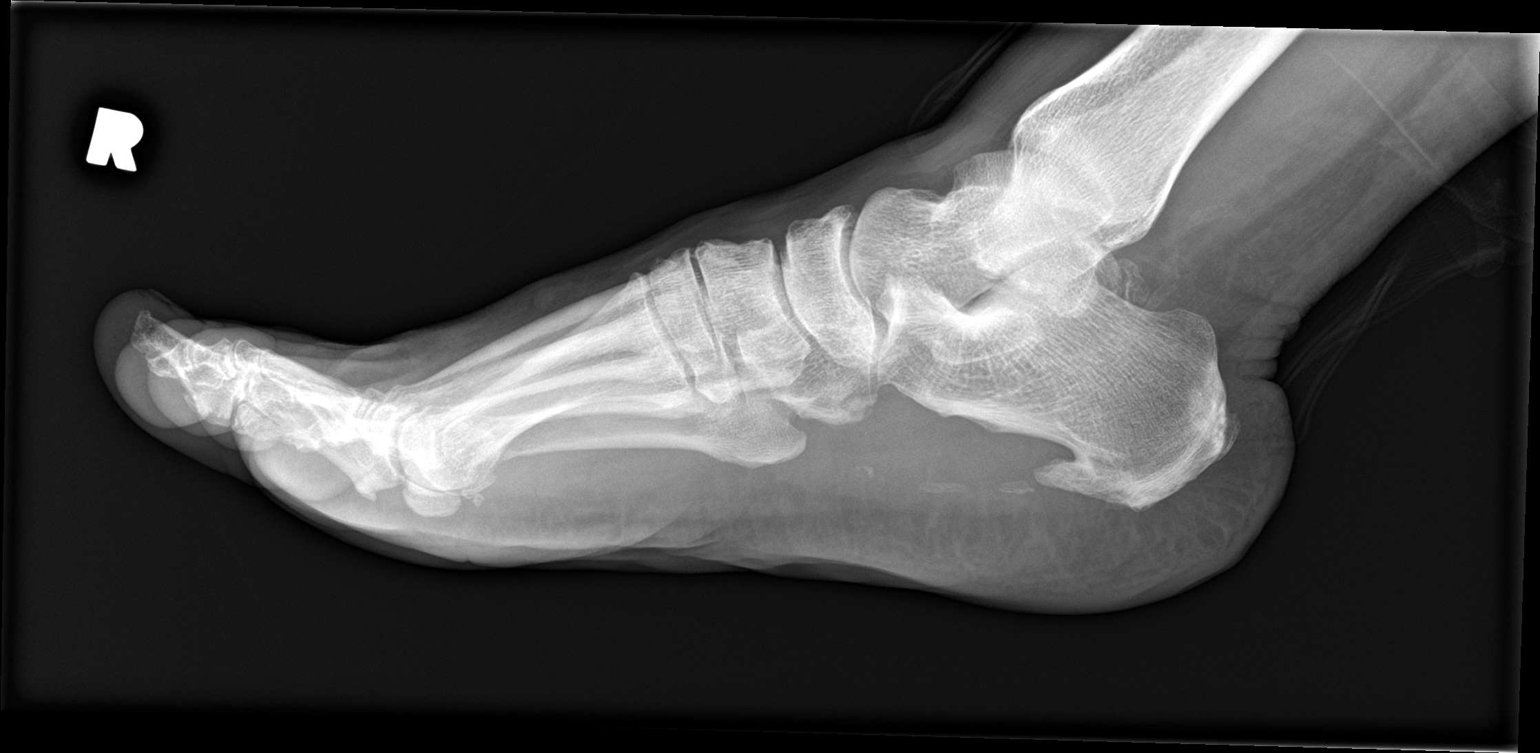

[3 of 3 positions shown; findings below may reference images not displayed]

FINDINGS: Left foot: Frontal, oblique, and lateral views are obtained. No
fracture, subluxation, or dislocation. Either resorption or
resection of the fifth middle phalanx. Prominent inferior calcaneal
spur. Calcifications within the plantar fascia. Mild diffuse
osteoarthritis greatest in the midfoot. Soft tissues are
unremarkable. No radiopaque foreign body.

Right foot: Frontal, oblique, lateral views are obtained. No acute
fracture, subluxation, or dislocation. Mild diffuse osteoarthritis
greatest in the midfoot. Prominent inferior calcaneal spur.
Calcifications within the plantar fascia. Soft tissues are
unremarkable. No evidence of radiopaque foreign body.
IMPRESSION: 1. No evidence of radiopaque foreign body within either foot.
2. Bilateral osteoarthritis.
3. Prominent inferior calcaneal spurs bilaterally, with
calcifications of the plantar fascia.

## 2023-04-14 IMAGING — US US THYROID
1 series · 13 of 25 positions shown · non-contrast
Comparison: Prior thyroid ultrasound 05/27/2020

CLINICAL DATA: Goiter. Patient previously underwent biopsy of right
inferior thyroid nodule in Thursday October, 2020.

EXAM:
THYROID ULTRASOUND
TECHNIQUE: Ultrasound examination of the thyroid gland and adjacent soft
tissues was performed.

[Series 1: us thyroid · 0.07mm/px · 43 acquisitions, 13 frames shown]
[im 1/43]
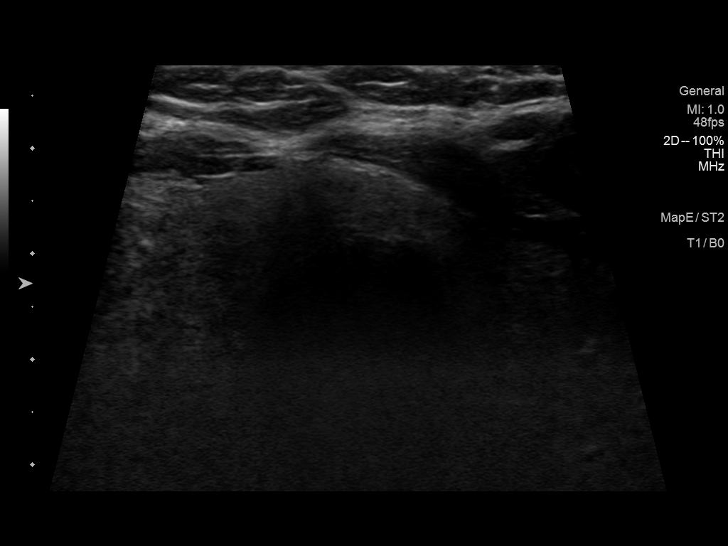
[im 4/43]
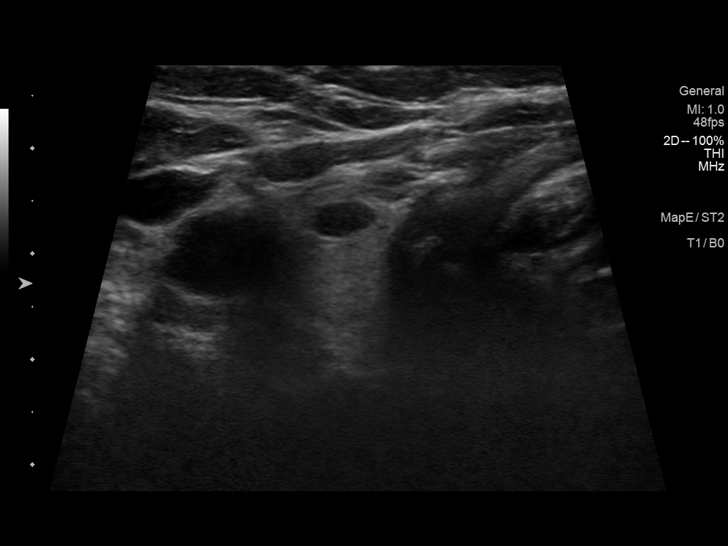
[im 8/43]
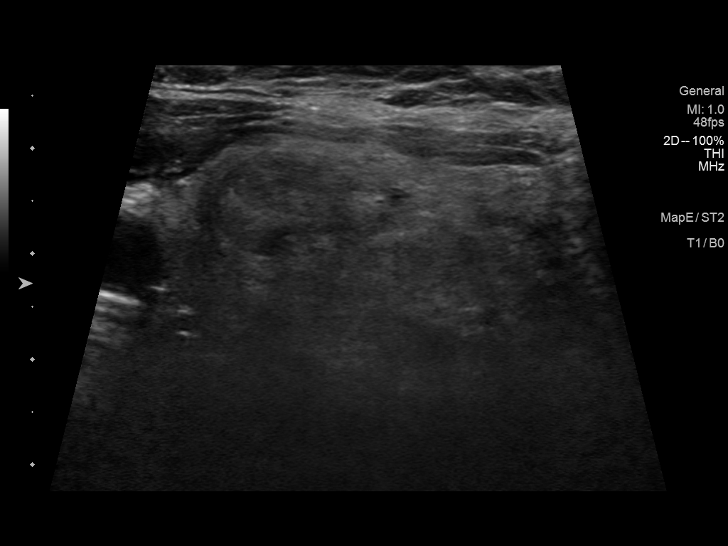
[im 11/43]
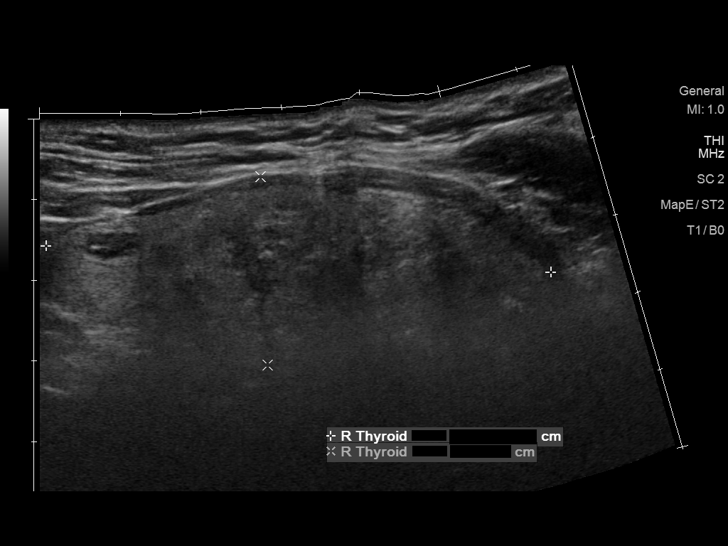
[im 15/43]
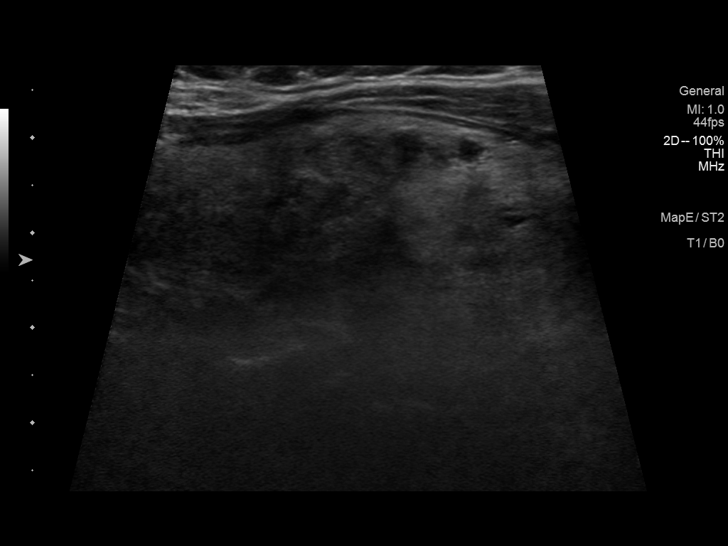
[im 18/43]
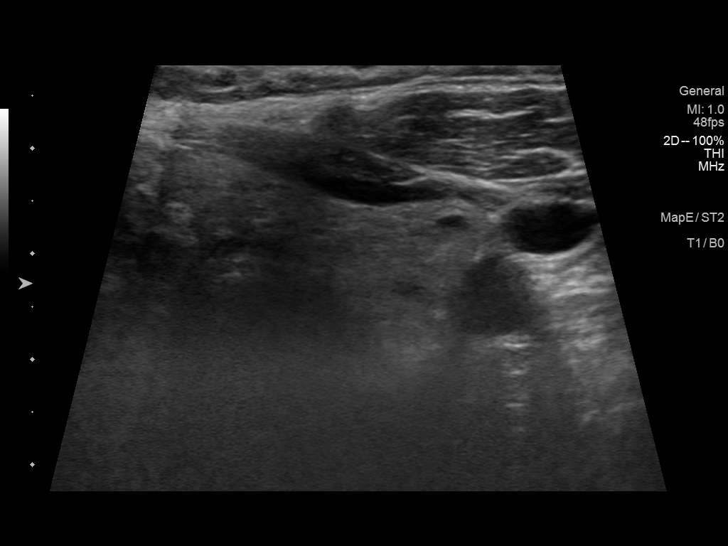
[im 22/43]
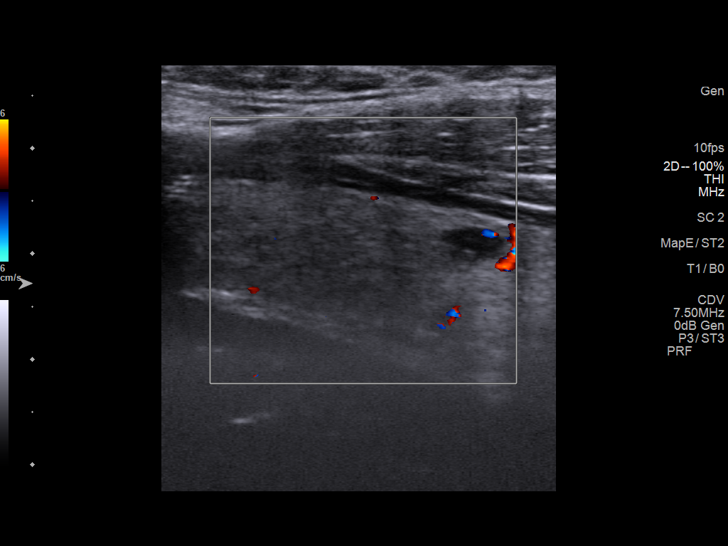
[im 25/43]
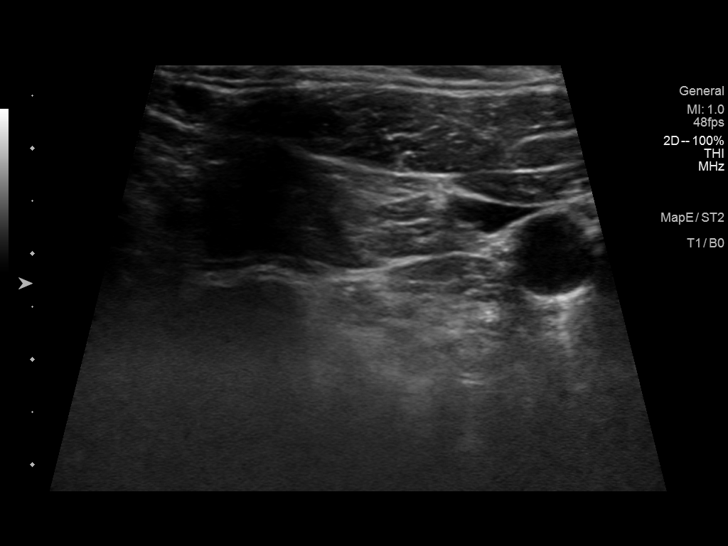
[im 29/43]
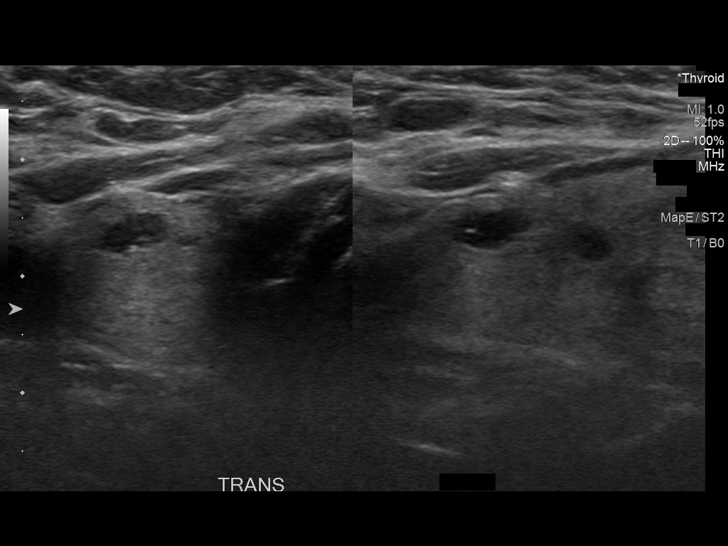
[im 32/43]
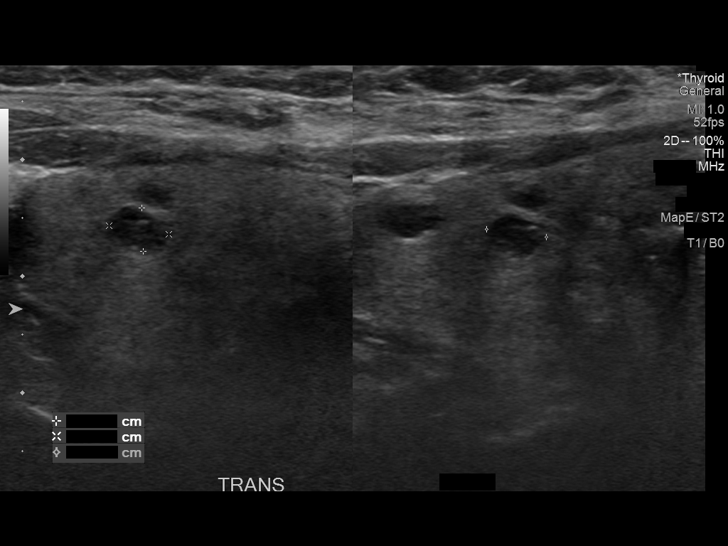
[im 36/43]
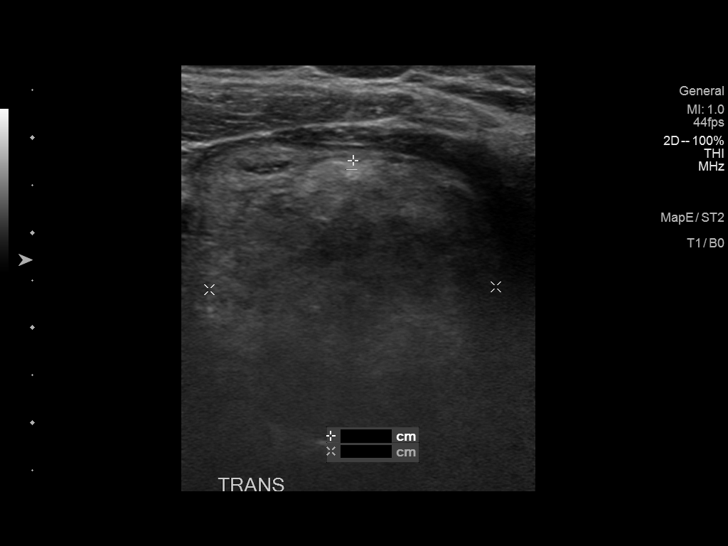
[im 39/43]
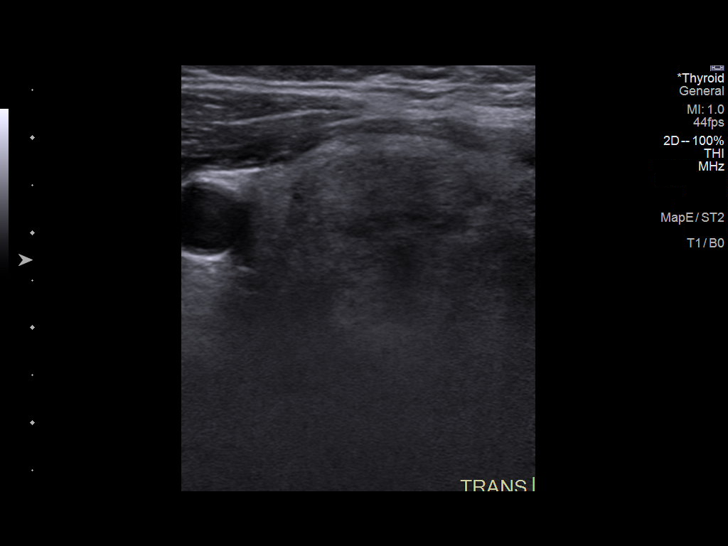
[im 43/43]
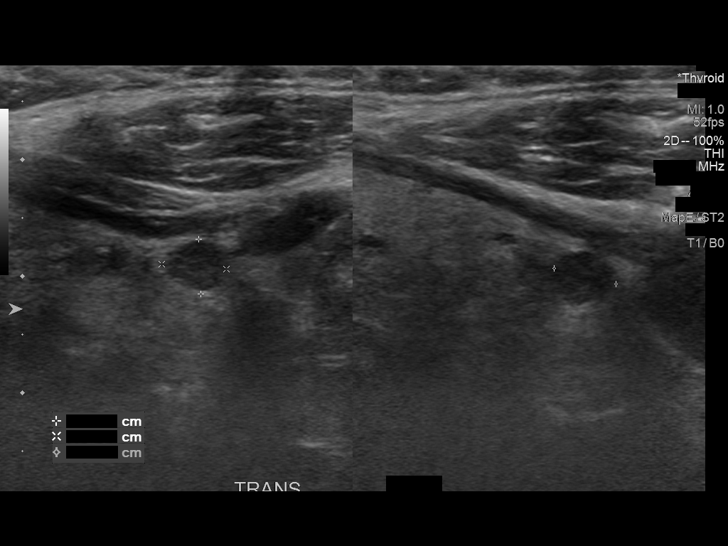

[13 of 25 positions shown; findings below may reference images not displayed]

FINDINGS: Parenchymal Echotexture: Moderately heterogenous

Isthmus: 0.7 cm

Right lobe: 6.2 x 2.3 x 2.5 cm

Left lobe: 4.1 x 1.3 x 2.0 cm

_________________________________________________________

Estimated total number of nodules >/= 1 cm: 2

Number of spongiform nodules >/=  2 cm not described below (TR1): 0

Number of mixed cystic and solid nodules >/= 1.5 cm not described
below (TR2): 0

_________________________________________________________

Diffusely heterogeneous thyroid gland with extensive goitrous
change. Multiple small thyroid nodules are identified in bilaterally
measuring less than 1 cm. These are considered incidental and
warrant no further follow-up.

Nodule # 3: Approximately 1.3 cm isoechoic solid nodule in the right
mid gland does not meet criteria to warrant further evaluation.

Nodule # 4: Previously biopsied mass in the lower pole of the right
gland measures 3.1 x 3.0 x 3.0 cm. This is slightly smaller than
x 3.1 x 2.8 cm measured previously.

No new nodules or suspicious features.
IMPRESSION: 1. Similar appearance of diffusely enlarged, heterogeneous and
multinodular thyroid gland most consistent with multinodular goiter.
2. Slight interval decrease in size of previously biopsied mass in
the lower pole of the right gland.
3. No new nodules or suspicious features identified.

The above is in keeping with the ACR TI-RADS recommendations - [HOSPITAL] 5790;[DATE].

## 2023-04-18 ENCOUNTER — Emergency Department (HOSPITAL_COMMUNITY): Payer: 59

## 2023-04-18 ENCOUNTER — Encounter (HOSPITAL_COMMUNITY): Payer: Self-pay | Admitting: Emergency Medicine

## 2023-04-18 ENCOUNTER — Other Ambulatory Visit: Payer: Self-pay

## 2023-04-18 ENCOUNTER — Emergency Department (HOSPITAL_COMMUNITY)
Admission: EM | Admit: 2023-04-18 | Discharge: 2023-04-18 | Disposition: A | Discharge status: Home / Self Care | Payer: 59 | Attending: Student | Admitting: Student

## 2023-04-18 DIAGNOSIS — Z96642 Presence of left artificial hip joint: Secondary | ICD-10-CM | POA: Insufficient documentation

## 2023-04-18 DIAGNOSIS — I2510 Atherosclerotic heart disease of native coronary artery without angina pectoris: Secondary | ICD-10-CM | POA: Diagnosis not present

## 2023-04-18 DIAGNOSIS — I5032 Chronic diastolic (congestive) heart failure: Secondary | ICD-10-CM | POA: Insufficient documentation

## 2023-04-18 DIAGNOSIS — Z7951 Long term (current) use of inhaled steroids: Secondary | ICD-10-CM | POA: Diagnosis not present

## 2023-04-18 DIAGNOSIS — I110 Hypertensive heart disease with heart failure: Secondary | ICD-10-CM | POA: Insufficient documentation

## 2023-04-18 DIAGNOSIS — Z79899 Other long term (current) drug therapy: Secondary | ICD-10-CM | POA: Diagnosis not present

## 2023-04-18 DIAGNOSIS — Z91040 Latex allergy status: Secondary | ICD-10-CM | POA: Insufficient documentation

## 2023-04-18 DIAGNOSIS — D72819 Decreased white blood cell count, unspecified: Secondary | ICD-10-CM | POA: Diagnosis not present

## 2023-04-18 DIAGNOSIS — R042 Hemoptysis: Secondary | ICD-10-CM

## 2023-04-18 DIAGNOSIS — R059 Cough, unspecified: Secondary | ICD-10-CM | POA: Diagnosis not present

## 2023-04-18 DIAGNOSIS — Z87891 Personal history of nicotine dependence: Secondary | ICD-10-CM | POA: Diagnosis not present

## 2023-04-18 DIAGNOSIS — Z8673 Personal history of transient ischemic attack (TIA), and cerebral infarction without residual deficits: Secondary | ICD-10-CM | POA: Insufficient documentation

## 2023-04-18 DIAGNOSIS — J45909 Unspecified asthma, uncomplicated: Secondary | ICD-10-CM | POA: Insufficient documentation

## 2023-04-18 DIAGNOSIS — R0789 Other chest pain: Secondary | ICD-10-CM | POA: Diagnosis not present

## 2023-04-18 DIAGNOSIS — Z7982 Long term (current) use of aspirin: Secondary | ICD-10-CM | POA: Insufficient documentation

## 2023-04-18 DIAGNOSIS — Z85830 Personal history of malignant neoplasm of bone: Secondary | ICD-10-CM | POA: Insufficient documentation

## 2023-04-18 DIAGNOSIS — E1165 Type 2 diabetes mellitus with hyperglycemia: Secondary | ICD-10-CM | POA: Insufficient documentation

## 2023-04-18 DIAGNOSIS — Z951 Presence of aortocoronary bypass graft: Secondary | ICD-10-CM | POA: Insufficient documentation

## 2023-04-18 LAB — CBC WITH DIFFERENTIAL/PLATELET
Abs Immature Granulocytes: 0.01 10*3/uL (ref 0.00–0.07)
Basophils Absolute: 0 10*3/uL (ref 0.0–0.1)
Basophils Relative: 1 %
Eosinophils Absolute: 0.1 10*3/uL (ref 0.0–0.5)
Eosinophils Relative: 3 %
HCT: 34.6 % — ABNORMAL LOW (ref 36.0–46.0)
Hemoglobin: 11.1 g/dL — ABNORMAL LOW (ref 12.0–15.0)
Immature Granulocytes: 0 %
Lymphocytes Relative: 39 %
Lymphs Abs: 1.5 10*3/uL (ref 0.7–4.0)
MCH: 29.5 pg (ref 26.0–34.0)
MCHC: 32.1 g/dL (ref 30.0–36.0)
MCV: 92 fL (ref 80.0–100.0)
Monocytes Absolute: 0.4 10*3/uL (ref 0.1–1.0)
Monocytes Relative: 9 %
Neutro Abs: 1.9 10*3/uL (ref 1.7–7.7)
Neutrophils Relative %: 48 %
Platelets: 178 10*3/uL (ref 150–400)
RBC: 3.76 MIL/uL — ABNORMAL LOW (ref 3.87–5.11)
RDW: 12.7 % (ref 11.5–15.5)
WBC: 3.9 10*3/uL — ABNORMAL LOW (ref 4.0–10.5)
nRBC: 0 % (ref 0.0–0.2)

## 2023-04-18 LAB — COMPREHENSIVE METABOLIC PANEL
ALT: 17 U/L (ref 0–44)
AST: 16 U/L (ref 15–41)
Albumin: 2.5 g/dL — ABNORMAL LOW (ref 3.5–5.0)
Alkaline Phosphatase: 38 U/L (ref 38–126)
Anion gap: 7 (ref 5–15)
BUN: 14 mg/dL (ref 8–23)
CO2: 19 mmol/L — ABNORMAL LOW (ref 22–32)
Calcium: 7.1 mg/dL — ABNORMAL LOW (ref 8.9–10.3)
Chloride: 115 mmol/L — ABNORMAL HIGH (ref 98–111)
Creatinine, Ser: 0.98 mg/dL (ref 0.44–1.00)
GFR, Estimated: >60 mL/min (ref >60)
Glucose, Bld: 123 mg/dL — ABNORMAL HIGH (ref 70–99)
Potassium: 3.1 mmol/L — ABNORMAL LOW (ref 3.5–5.1)
Sodium: 141 mmol/L (ref 135–145)
Total Bilirubin: 0.7 mg/dL (ref <1.2)
Total Protein: 4.4 g/dL — ABNORMAL LOW (ref 6.5–8.1)

## 2023-04-18 LAB — TROPONIN I (HIGH SENSITIVITY): Troponin I (High Sensitivity): 6 ng/L (ref <18)

## 2023-04-18 LAB — BRAIN NATRIURETIC PEPTIDE: B Natriuretic Peptide: 14.3 pg/mL (ref 0.0–100.0)

## 2023-04-18 MED ORDER — FENTANYL CITRATE PF 50 MCG/ML IJ SOSY
50.0000 ug | PREFILLED_SYRINGE | Freq: Once | INTRAMUSCULAR | Status: AC
  Administered 2023-04-18: 50 ug via INTRAVENOUS
  Filled 2023-04-18: qty 1

## 2023-04-18 MED ORDER — IOHEXOL 350 MG/ML SOLN
50.0000 mL | Freq: Once | INTRAVENOUS | Status: AC | PRN
  Administered 2023-04-18: 50 mL via INTRAVENOUS

## 2023-04-18 NOTE — ED Triage Notes (Signed)
Pt BIB EMS from home with chest pain and hemoptysis earlier tonight. Hx of PE. O2 sat dropped to 92% on RA.   18G LAC  324 ASA 1 0.4 SL nitro

## 2023-04-18 NOTE — ED Provider Notes (Signed)
EMERGENCY DEPARTMENT AT [facility] Provider Note  CSN: [id number] Arrival date & time: 04/18/23 0151  Chief Complaint(s) Chest Pain  HPI [name] is a 62 y.o. female with PMH CHF, previous PE no longer on anticoagulation, IBS, depression who presents emergency department for evaluation of chest pain and hemoptysis.  States that for the last 48 hours she has been coughing and woke up this morning with substernal chest pain and streaks of blood in yellow mucus.  States that she feels similar to her previous PE.  Denies associated abdominal pain, nausea, vomiting, headache, fever or other systemic symptoms.   Past Medical History Past Medical History:  Diagnosis Date   Allergy    Anginal pain (HCC)    a. NL cath in 2008;  b. Myoview 03/2011: dec uptake along mid anterior wall on stress imaging -> ? attenuation vs. ischemia, EF 65%;  c. 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 2008 -> coumadin x 6 mos.   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   Back pain 06/01/2021   Right thyroid nodule 05/31/2021   At risk for impaired metabolic function 04/12/2020   Unilateral primary osteoarthritis, left hip    Hip arthritis 06/11/2018   Class 3 severe obesity with serious comorbidity and body mass index (BMI) of 40.0 to 44.9 in adult (HCC) 05/29/2018   Other fatigue 11/20/2017   Shortness of breath on exertion 11/20/2017   Type 2 diabetes mellitus with hyperglycemia, without long-term current use of insulin (HCC) 11/20/2017   Vitamin D deficiency 11/20/2017   Depression with anxiety 11/20/2017   Other hyperlipidemia 11/20/2017   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/04/2016   Presence of left artificial knee joint 09/20/2016   Arthritis of knee 08/22/2016   Cervical radiculopathy 07/26/2016   Cervical disc disorder with radiculopathy 07/26/2016  Right arm pain 06/29/2016   Cervicalgia 06/29/2016   Primary osteoarthritis of left knee 06/29/2016   Hypersomnia with sleep apnea 11/18/2015   Lethargy 11/18/2015   Exertional chest pain 09/30/2014   Abnormal cardiac function test 09/29/2014   Chest pain with moderate risk for cardiac etiology 09/28/2014   HTN (hypertension) 09/28/2014   AKI (acute kidney injury) (HCC)    Paresthesia 06/18/2014   Numbness and tingling of left arm and leg    Unstable angina (HCC) 05/24/2014   OSA on CPAP 05/24/2014   CAD (coronary artery disease) 11/25/2012   S/P laparoscopic appendectomy 06/04/2012   Chronic diastolic congestive heart failure (HCC) 01/09/2012   Mediastinal mass 01/09/2012   SOB (shortness of breath) 06/20/2011   Mediastinal abnormality 06/20/2011   Dyslipidemia 12/23/2009   GLAUCOMA  12/23/2009   ARTHRITIS 12/23/2009   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 Authorizing Provider  ACCU-CHEK GUIDE test strip  10/28/21   [provider]  acetaminophen (TYLENOL) 500 MG tablet Take 1,000 mg by mouth 2 (two) times daily as needed for moderate pain.    [provider]  albuterol (PROVENTIL HFA;VENTOLIN HFA) 108 (90 Base) MCG/ACT inhaler Inhale 2 puffs into the lungs every 6 (six) hours as needed for wheezing or shortness of breath.    [provider]  aspirin 81 MG chewable tablet Chew 81 mg by mouth daily.    [provider]  atorvastatin (LIPITOR) 40 MG tablet Take 40 mg by mouth at bedtime.     [provider]  BIOTIN PO Take 1 tablet by mouth at bedtime.    [provider]  busPIRone (BUSPAR) 30 MG tablet Take 30 mg by mouth 2 (two) times daily. 12/18/19   [provider]  carvedilol (COREG) 6.25 MG tablet Take 1 tablet (6.25 mg total) by mouth 2 (two) times daily. 02/22/23   Sharlene Dory, PA-C  cyclobenzaprine (FLEXERIL) 10 MG tablet Take 1 tablet (10 mg total) by mouth 2 (two) times daily as needed for muscle spasms. 02/15/23   Sloan Leiter, DO  diclofenac Sodium (VOLTAREN) 1 % GEL Apply 2 g topically 4 (four) times daily as needed (joint/muscle pain). 09/20/19   [provider]  empagliflozin (JARDIANCE) 10 MG TABS tablet Take 1 tablet (10 mg total) by mouth daily. 03/28/22   Arrien, York Ram, MD   EPINEPHrine 0.3 mg/0.3 mL IJ SOAJ injection Inject 0.3 mg into the muscle as needed for anaphylaxis. Follow package instructions as needed for severe allergy or anaphylactic reaction. 11/06/21   Sharman Cheek, MD  furosemide (LASIX) 40 MG tablet Take 1 tablet (40 mg total) by mouth daily as needed for edema or fluid (take in case of weight gain 2 to 3 lbs in 24 hrs or 5 lbs in 7 days.). Patient taking differently: Take 20 mg by mouth daily. 03/28/22 02/22/23  Arrien, York Ram, MD  hydrOXYzine (VISTARIL) 25 MG capsule Take 25 mg by mouth 3 (three) times daily as needed for itching.  [provider]  ipratropium-albuterol (DUONEB) 0.5-2.5 (3) MG/3ML SOLN Take 3 mLs by nebulization every 6 (six) hours as needed (shortness of breath). 11/22/22   [provider]  KLOR-CON M10 10 MEQ tablet Take 10 mEq by mouth 2 (two) times daily. 12/07/22   [provider]  loperamide (IMODIUM) 2 MG capsule Take 1 capsule (2 mg total) by mouth 4 (four) times daily as needed for diarrhea or loose stools. 01/07/22   Long, Arlyss Repress, MD  losartan (COZAAR) 25 MG tablet Take 1 tablet (25 mg total) by mouth daily. 02/22/23 03/24/23  Sharlene Dory, PA-C  metFORMIN (GLUCOPHAGE) 500 MG tablet 1/2 tab daily with breakfast 04/03/23   Rayburn, Fanny Bien, PA-C  nitroGLYCERIN (NITROSTAT) 0.3 MG SL tablet Place 0.3 mg under the tongue every 5 (five) minutes as needed for chest pain.    [provider]  nystatin (MYCOSTATIN/NYSTOP) powder Apply 1 application topically 3 (three) times daily as needed for rash.    [provider]  ondansetron (ZOFRAN-ODT) 4 MG disintegrating tablet Take 1 tablet (4 mg total) by mouth every 8 (eight) hours as needed for nausea or vomiting. 10/15/22   Chestine Spore, Meghan R, PA-C  oxyCODONE (OXY IR/ROXICODONE) 5 MG immediate release tablet Take 1 tablet by mouth every 6 (six) hours as needed.    [provider]  pantoprazole (PROTONIX) 20 MG tablet  Take 1 tablet (20 mg total) by mouth daily for 14 days. 02/15/23 03/01/23  Sloan Leiter, DO  sertraline (ZOLOFT) 25 MG tablet Take 1 tablet (25 mg total) by mouth daily. 04/03/23   Rayburn, Fanny Bien, PA-C  spironolactone (ALDACTONE) 25 MG tablet Take 1 tablet (25 mg total) by mouth every other day. 02/22/23 03/24/23  Sharlene Dory, PA-C  tirzepatide [facility]) 2.5 MG/0.5ML Pen Inject 2.5 mg into the skin once a week. 04/03/23   Rayburn, Fanny Bien, PA-C  Vitamin D, Ergocalciferol, (DRISDOL) 1.25 MG (50000 UNIT) CAPS capsule Take 1 capsule (50,000 Units total) by mouth every 7 (seven) days. 04/03/23   Rayburn, Fanny Bien, PA-C                                                                                                                                    Past Surgical History Past Surgical History:  Procedure Laterality Date   ABDOMINAL HYSTERECTOMY  2005   APPENDECTOMY     BREAST LUMPECTOMY WITH RADIOACTIVE SEED AND SENTINEL LYMPH NODE BIOPSY Right 11/16/2021   Procedure: RIGHT BREAST RADIOACTIVE SEED LOCALIZED LUMPECTOMY WITH RIGHT AXILLARY SENTINEL LYMPH NODE BIOPSY;  Surgeon: Manus Rudd, MD;  Location: [facility];  Service: General;  Laterality: Right;  LMA & PEC BLOCK   CARDIAC CATHETERIZATION     Normal   CARDIAC CATHETERIZATION N/A 09/30/2014   Procedure: Left Heart Cath and Coronary Angiography;  Surgeon: Tonny Bollman, MD;  Location: [facility] INVASIVE CV LAB;  Service: Cardiovascular;  Laterality: N/A;  LAPAROSCOPIC APPENDECTOMY N/A 06/03/2012   Procedure: APPENDECTOMY LAPAROSCOPIC;  Surgeon: Almond Lint, MD;  Location: [facility] OR;  Service: General;  Laterality: N/A;   Left knee surgery  2008   LEG SURGERY     RIGHT/LEFT HEART CATH AND CORONARY ANGIOGRAPHY N/A 01/10/2023   Procedure: RIGHT/LEFT HEART CATH AND CORONARY ANGIOGRAPHY;  Surgeon: Orbie Pyo, MD;  Location: [facility] INVASIVE CV LAB;  Service: Cardiovascular;  Laterality: N/A;   TONSILLECTOMY      TOTAL HIP ARTHROPLASTY Left 06/11/2018   Procedure: LEFT TOTAL HIP ARTHROPLASTY ANTERIOR APPROACH;  Surgeon: Cammy Copa, MD;  Location: [facility] OR;  Service: Orthopedics;  Laterality: Left;   TOTAL KNEE ARTHROPLASTY Left 08/22/2016   Procedure: TOTAL KNEE ARTHROPLASTY;  Surgeon: Cammy Copa, MD;  Location: [facility] OR;  Service: Orthopedics;  Laterality: Left;   TUBAL LIGATION  1989   Family History Family History  Problem Relation Age of Onset   Emphysema Mother    Arthritis Mother    Heart failure Mother        alive @ 30   Stroke Mother    Diabetes Mother    Hypertension Mother    Hyperlipidemia Mother    Depression Mother    Anxiety disorder Mother    Heart disease Mother    Alcoholism Mother    Depression Father    Heart disease Father        died @ 81's.   Stroke Father    Diabetes Father    Hyperlipidemia Father    Hypertension Father    Bipolar disorder Father    Sleep apnea Father    Alcoholism Father    Drug abuse Father    Sudden death Father    Diabetes Sister    Asthma Brother    Breast cancer Maternal Aunt 55 - 30   Colon cancer Maternal Grandfather        dx after 50   Heart disease Paternal Grandfather    Breast cancer Cousin        dx 61s   Esophageal cancer Neg Hx    Stomach cancer Neg Hx    Rectal cancer Neg Hx     Social History Social History   Tobacco Use   Smoking status: Former    Current packs/day: 0.00    Average packs/day: 0.5 packs/day for 15.0 years (7.5 ttl pk-yrs)    Types: Cigarettes    Start date: 04/24/1970    Quit date: 04/24/1985    Years since quitting: 38.0   Smokeless tobacco: Never  Vaping Use   Vaping status: Never Used  Substance Use Topics   Alcohol use: No    Alcohol/week: 0.0 standard drinks of alcohol   Drug use: Not Currently    Types: Marijuana   Allergies Shellfish allergy, Hydrocodone-acetaminophen, Penicillins, Gabapentin, Latex, and Sulfa antibiotics  Review of Systems Review of Systems   Respiratory:  Positive for cough and shortness of breath.   Cardiovascular:  Positive for chest pain.    Physical Exam Vital Signs  I have reviewed the triage vital signs BP 136/78   Pulse 72   Temp 98.1 F (36.7 C)   Resp 17   Ht 4\' 11"  (1.499 m)   Wt 92.1 kg   SpO2 97%   BMI 41.01 kg/m   Physical Exam Vitals and nursing note reviewed.  Constitutional:      General: She is not in acute distress.    Appearance: She is well-developed.  HENT:  Head: Normocephalic and atraumatic.  Eyes:     Conjunctiva/sclera: Conjunctivae normal.  Cardiovascular:     Rate and Rhythm: Normal rate and regular rhythm.     Heart sounds: No murmur heard. Pulmonary:     Effort: Pulmonary effort is normal. No respiratory distress.     Breath sounds: Normal breath sounds.  Abdominal:     Palpations: Abdomen is soft.     Tenderness: There is no abdominal tenderness.  Musculoskeletal:        General: No swelling.     Cervical back: Neck supple.  Skin:    General: Skin is warm and dry.     Capillary Refill: Capillary refill takes less than 2 seconds.  Neurological:     Mental Status: She is alert.  Psychiatric:        Mood and Affect: Mood normal.     ED Results and Treatments Labs (all labs ordered are listed, but only abnormal results are displayed) Labs Reviewed  COMPREHENSIVE METABOLIC PANEL - Abnormal; Notable for the following components:      Result Value   Potassium 3.1 (*)    Chloride 115 (*)    CO2 19 (*)    Glucose, Bld 123 (*)    Calcium 7.1 (*)    Total Protein 4.4 (*)    Albumin 2.5 (*)    All other components within normal limits  CBC WITH DIFFERENTIAL/PLATELET - Abnormal; Notable for the following components:   WBC 3.9 (*)    RBC 3.76 (*)    Hemoglobin 11.1 (*)    HCT 34.6 (*)    All other components within normal limits  BRAIN NATRIURETIC PEPTIDE  TROPONIN I (HIGH SENSITIVITY)  TROPONIN I (HIGH SENSITIVITY)                                                                                                                           Radiology CT Angio Chest PE W and/or Wo Contrast Result Date: 04/18/2023 CLINICAL DATA:  Chest pain and hemoptysis. EXAM: CT ANGIOGRAPHY CHEST WITH CONTRAST TECHNIQUE: Multidetector CT imaging of the chest was performed using the standard protocol during bolus administration of intravenous contrast. Multiplanar CT image reconstructions and MIPs were obtained to evaluate the vascular anatomy. RADIATION DOSE REDUCTION: This exam was performed according to the departmental dose-optimization program which includes automated exposure control, adjustment of the mA and/or kV according to patient size and/or use of iterative reconstruction technique. CONTRAST:  50mL OMNIPAQUE IOHEXOL 350 MG/ML SOLN COMPARISON:  February 15, 2023 FINDINGS: Cardiovascular: There is mild calcification of the aortic arch, without evidence of aortic aneurysm. There is stable dilatation of the main pulmonary artery which measures up to 3.6 cm in diameter. Satisfactory opacification of the pulmonary arteries to the segmental level. No evidence of pulmonary embolism. Normal heart size. No pericardial effusion. Mediastinum/Nodes: No enlarged mediastinal, hilar, or axillary lymph nodes. The right lobe of the thyroid gland is asymmetrically enlarged and heterogeneous in appearance. A stable  1.8 cm x 3.1 cm soft tissue nodule is seen extending from the inferior tip of the right lobe of the thyroid gland into the anterior mediastinum. The trachea and esophagus demonstrate no significant findings. Lungs/Pleura: Mild lingular and mild right middle lobe linear atelectasis is seen. There is no evidence of an acute infiltrate, pleural effusion or pneumothorax. Upper Abdomen: No acute abnormality. Musculoskeletal: Multilevel degenerative changes seen throughout the thoracic spine. Review of the MIP images confirms the above findings. IMPRESSION: 1. No evidence of pulmonary embolism  or other acute intrathoracic process. 2. Stable dilatation of the main pulmonary artery, which may be seen in the setting of pulmonary arterial hypertension. 3. Stable substernal extension of a right thyroid goiter. 4. Aortic atherosclerosis. Aortic Atherosclerosis (ICD10-I70.0). Electronically Signed   By: Aram Candela M.D.   On: 04/18/2023 03:44   DG Chest Portable 1 View Result Date: 04/18/2023 CLINICAL DATA:  Shortness of breath EXAM: PORTABLE CHEST 1 VIEW COMPARISON:  CT 02/15/2023 FINDINGS: Heart and mediastinal contours within normal limits. No confluent airspace opacities or effusions. No acute bony abnormality. IMPRESSION: No active disease. Electronically Signed   By: Charlett Nose M.D.   On: 04/18/2023 02:33    Pertinent labs & imaging results that were available during my care of the patient were reviewed by me and considered in my medical decision making (see MDM for details).  Medications Ordered in ED Medications  fentaNYL (SUBLIMAZE) injection 50 mcg (50 mcg Intravenous Given 04/18/23 0303)  iohexol (OMNIPAQUE) 350 MG/ML injection 50 mL (50 mLs Intravenous Contrast Given 04/18/23 0323)                                                                                                                                     Procedures Procedures  (including critical care time)  Medical Decision Making / ED Course   This patient presents to the ED for concern of chest pain, this involves an extensive number of treatment options, and is a complaint that carries with it a high risk of complications and morbidity.  The differential diagnosis includes ACS, Aortic Dissection, Pneumothorax, Pneumonia, Esophageal Rupture, PE, Tamponade/Pericardial Effusion, pericarditis, esophageal spasm, dysrhythmia, GERD, costochondritis.  MDM: Patient seen emergency room for evaluation of chest pain and hemoptysis.  Physical exam is unremarkable with no wheezing heard on lung exam.  There is  reproducible tenderness over the sternum.  Laboratory evaluation with a leukopenia to 3.9, hemoglobin 11.1 which is slightly downtrending from previous, CO2 19, albumin 2.5.  ECG nonischemic and unchanged from previous.  Chest x-ray unremarkable.  CT PE without evidence of pulmonary embolism, pulmonary arterial hypertension and a stable right thyroid goiter.  On reevaluation, symptoms have improved.  Low suspicion for PE with negative PE study.  Low suspicion for ACS given negative as instated troponin today and recent cardiac cath in September with 25% stenosis of the LAD but otherwise clean coronaries.  Symptoms likely consistent  with bronchitis and as she is not hypoxic or in respiratory distress she currently does not meet inpatient criteria for admission and will be discharged with outpatient follow-up.  Return precautions given which she voiced understanding   Additional history obtained:  -External records from outside source obtained and reviewed including: Chart review including previous notes, labs, imaging, consultation notes   Lab Tests: -I ordered, reviewed, and interpreted labs.   The pertinent results include:   Labs Reviewed  COMPREHENSIVE METABOLIC PANEL - Abnormal; Notable for the following components:      Result Value   Potassium 3.1 (*)    Chloride 115 (*)    CO2 19 (*)    Glucose, Bld 123 (*)    Calcium 7.1 (*)    Total Protein 4.4 (*)    Albumin 2.5 (*)    All other components within normal limits  CBC WITH DIFFERENTIAL/PLATELET - Abnormal; Notable for the following components:   WBC 3.9 (*)    RBC 3.76 (*)    Hemoglobin 11.1 (*)    HCT 34.6 (*)    All other components within normal limits  BRAIN NATRIURETIC PEPTIDE  TROPONIN I (HIGH SENSITIVITY)  TROPONIN I (HIGH SENSITIVITY)      EKG   EKG Interpretation Date/Time:  Wednesday April 18 2023 02:01:17 EST Ventricular Rate:  77 PR Interval:  196 QRS Duration:  125 QT Interval:  375 QTC  Calculation: 425 R Axis:   68  Text Interpretation: Sinus rhythm Anteroseptal infarct, old No significant change since last tracing Confirmed by Adylee Leonardo ([id number]) on 04/18/2023 5:14:15 AM         Imaging Studies ordered: I ordered imaging studies including chest x-ray, CT PE I independently visualized and interpreted imaging. I agree with the radiologist interpretation   Medicines ordered and prescription drug management: Meds ordered this encounter  Medications   fentaNYL (SUBLIMAZE) injection 50 mcg   iohexol (OMNIPAQUE) 350 MG/ML injection 50 mL    -I have reviewed the patients home medicines and have made adjustments as needed  Critical interventions none   Cardiac Monitoring: The patient was maintained on a cardiac monitor.  I personally viewed and interpreted the cardiac monitored which showed an underlying rhythm of: NSR  Social Determinants of Health:  Factors impacting patients care include: none   Reevaluation: After the interventions noted above, I reevaluated the patient and found that they have :improved  Co morbidities that complicate the patient evaluation  Past Medical History:  Diagnosis Date   Allergy    Anginal pain (HCC)    a. NL cath in 2008;  b. Myoview 03/2011: dec uptake along mid anterior wall on stress imaging -> ? attenuation vs. ischemia, EF 65%;  c. Echo 04/2011: EF 55-60%, no RWMA, Gr 2 dd   Anxiety    Arthritis    Asthma    Back pain    Bone cancer (HCC)    Cancer (HCC)    Chest pain    CHF (congestive heart failure) (HCC)    Clotting disorder (HCC)    Constipation    Depression    Diabetes mellitus    Drug use    Dyspnea    Frequent urination    GERD (gastroesophageal reflux disease)    Glaucoma    History of stomach ulcers    HLD (hyperlipidemia)    Hypertension    IBS (irritable bowel syndrome)    Joint pain    Joint pain    Lactose intolerance  Leg edema    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 benign  thymoma   Neuromuscular disorder (HCC)    Obesity    Palpitations    Pneumonia 05/2016   double   Pulmonary edema    Pulmonary embolism (HCC)    a. 2008 -> coumadin x 6 mos.   Rheumatoid arthritis (HCC)    Sleep apnea    on CPAP 02/2018   SOB (shortness of breath)    Thyroid disease    TIA (transient ischemic attack)    Urinary urgency    Vitamin D deficiency       Dispostion: I considered admission for this patient, but at this time she does not meet inpatient criteria for admission and will be discharged with outpatient follow-up     Final Clinical Impression(s) / ED Diagnoses Final diagnoses:  Atypical chest pain  Hemoptysis     @PCDICTATION @    Glendora Score, MD 04/18/23 [id number]

## 2023-05-07 ENCOUNTER — Ambulatory Visit (INDEPENDENT_AMBULATORY_CARE_PROVIDER_SITE_OTHER): Payer: 59 | Admitting: Adult Health

## 2023-05-07 ENCOUNTER — Encounter (INDEPENDENT_AMBULATORY_CARE_PROVIDER_SITE_OTHER): Payer: Self-pay | Admitting: Adult Health

## 2023-05-07 VITALS — BP 117/74 | HR 80 | Temp 97.8°F | Ht 59.0 in | Wt 202.0 lb

## 2023-05-07 DIAGNOSIS — E559 Vitamin D deficiency, unspecified: Secondary | ICD-10-CM | POA: Diagnosis not present

## 2023-05-07 DIAGNOSIS — Z7985 Long-term (current) use of injectable non-insulin antidiabetic drugs: Secondary | ICD-10-CM

## 2023-05-07 DIAGNOSIS — E669 Obesity, unspecified: Secondary | ICD-10-CM | POA: Diagnosis not present

## 2023-05-07 DIAGNOSIS — E1165 Type 2 diabetes mellitus with hyperglycemia: Secondary | ICD-10-CM | POA: Diagnosis not present

## 2023-05-07 DIAGNOSIS — Z7984 Long term (current) use of oral hypoglycemic drugs: Secondary | ICD-10-CM

## 2023-05-07 DIAGNOSIS — Z6841 Body Mass Index (BMI) 40.0 and over, adult: Secondary | ICD-10-CM | POA: Diagnosis not present

## 2023-05-07 MED ORDER — TIRZEPATIDE 2.5 MG/0.5ML ~~LOC~~ SOAJ: 2.5000 mg | SUBCUTANEOUS | 0 refills | Status: DC

## 2023-05-07 MED ORDER — VITAMIN D (ERGOCALCIFEROL) 1.25 MG (50000 UNIT) PO CAPS: 50000.0000 [IU] | ORAL_CAPSULE | ORAL | 0 refills | Status: DC

## 2023-05-07 MED ORDER — METFORMIN HCL 500 MG PO TABS: ORAL_TABLET | ORAL | 0 refills | Status: DC

## 2023-05-07 NOTE — Progress Notes (Signed)
 WEIGHT SUMMARY AND BIOMETRICS  Vitals Temp: 97.8 F (36.6 C) BP: 117/74 Pulse Rate: 80 SpO2: 99 %   Anthropometric Measurements Height: 4' 11 (1.499 m) Weight: 202 lb (91.6 kg) BMI (Calculated): 40.78 Weight at Last Visit: 203lb Weight Lost Since Last Visit: 1lb Weight Gained Since Last Visit: 0 Starting Weight: 209lb Total Weight Loss (lbs): 7 lb (3.175 kg)   Body Composition  Body Fat %: 49 % Fat Mass (lbs): 99 lbs Muscle Mass (lbs): 97.8 lbs Total Body Water (lbs): 75.6 lbs Visceral Fat Rating : 16   Other Clinical Data Fasting: no Labs: no Today's Visit #: 20 Starting Date: 06/01/21    Chief Complaint:   OBESITY [name] is here to discuss her progress with her obesity treatment plan. She is on the the Category 2 Plan and states she is following her eating plan approximately 80 % of the time.  She states she is exercising: None   Interim History:  She was started on loading dose Mounjaro  2.5mg  on/about 03/07/2023 Denies mass in neck, dysphagia, dyspepsia, persistent hoarseness, abdominal pain, or N/V/C   She reports low appetite.  She provided the food recall that is typical of a day: Breakfast: One of the following: -Bowl of cereal -Yogurt - 1 egg with serving of sausage Lunch: 4 oz meat sandwich Dinner: Skips or leftovers: baked chicken, grilled vegetables  Of note- She has three adult children: 2 daughters (age 10, 7), 1 son (age 76) She has 6 grandchildren  All in [place]   She is on disability since 2010- chronic back pain, chronic hip pain  Subjective:   1. Type 2 diabetes mellitus with hyperglycemia, without long-term current use of insulin  (HCC) Lab Results  Component Value Date   HGBA1C 7.9 (H) 12/11/2022   HGBA1C 7.3 (H) 07/15/2022   HGBA1C 6.4 (H) 09/28/2021    She has been infrequently checking fasting CBG, last check was 90 - 3 days ago She denies sx's of hypoglycemia She is on daily Metformin  500mg  1/2 tab-  she recently increased Metformin  to full tab due experiencing muscle cramps??? She was started on loading dose Mounjaro  2.5mg  on/about 03/07/2023 Denies mass in neck, dysphagia, dyspepsia, persistent hoarseness, abdominal pain, or N/V/C  She reports low appetite.  2. Vitamin D  deficiency  Latest Reference Range & Units 01/11/23 10:17  Vitamin D , 25-Hydroxy 30.0 - 100.0 ng/mL 33.5   She is on weekly Ergocalciferol - she denies N/V/Muscle Weakness  Assessment/Plan:   1. Type 2 diabetes mellitus with hyperglycemia, without long-term current use of insulin  (HCC) (Primary) Refill- remain on loading dose due to low appetite  tirzepatide  (MOUNJARO ) 2.5 MG/0.5ML Pen Inject 2.5 mg into the skin once a week. Dispense: 2 mL, Refills: 0 ordered    metFORMIN  (GLUCOPHAGE ) 500 MG tablet 1/2 tab daily with breakfast Dispense: 30 tablet, Refills: 0 ordered   Check Labs  2. Vitamin D  deficiency Refill Vitamin D , Ergocalciferol , (DRISDOL ) 1.25 MG (50000 UNIT) CAPS capsule Take 1 capsule (50,000 Units total) by mouth every 7 (seven) days. Dispense: 4 capsule, Refills: 0 ordered   Check Labs  3. Obesity, current BMI of 40.78  Darin is currently in the action stage of change. As such, her goal is to continue with weight loss efforts. She has agreed to the Category 2 Plan.   Exercise goals: All adults should avoid inactivity. Some physical activity is better than none, and adults who participate in any amount of physical activity gain some health benefits. Adults should also  include muscle-strengthening activities that involve all major muscle groups on 2 or more days a week.  Behavioral modification strategies: increasing lean protein intake, decreasing simple carbohydrates, increasing vegetables, increasing water intake, no skipping meals, meal planning and cooking strategies, keeping healthy foods in the home, ways to avoid boredom eating, and planning for success.  [name] has agreed to follow-up  with our clinic in 4 weeks. She was informed of the importance of frequent follow-up visits to maximize her success with intensive lifestyle modifications for her multiple health conditions.   Check Fasting Labs at next OV  Objective:   Blood pressure 117/74, pulse 80, temperature 97.8 F (36.6 C), height 4' 11 (1.499 m), weight 202 lb (91.6 kg), SpO2 99%. Body mass index is 40.8 kg/m.  General: Cooperative, alert, well developed, in no acute distress. HEENT: Conjunctivae and lids unremarkable. Cardiovascular: Regular rhythm.  Lungs: Normal work of breathing. Neurologic: No focal deficits.   Lab Results  Component Value Date   CREATININE 0.98 04/18/2023   BUN 14 04/18/2023   NA 141 04/18/2023   K 3.1 (L) 04/18/2023   CL 115 (H) 04/18/2023   CO2 19 (L) 04/18/2023   Lab Results  Component Value Date   ALT 17 04/18/2023   AST 16 04/18/2023   ALKPHOS 38 04/18/2023   BILITOT 0.7 04/18/2023   Lab Results  Component Value Date   HGBA1C 7.9 (H) 12/11/2022   HGBA1C 7.3 (H) 07/15/2022   HGBA1C 6.4 (H) 09/28/2021   HGBA1C 8.4 (H) 03/08/2021   HGBA1C 7.6 (H) 02/03/2020   Lab Results  Component Value Date   INSULIN  13.4 08/14/2022   INSULIN  22.1 09/28/2021   INSULIN  26.8 (H) 06/01/2021   INSULIN  44.4 (H) 02/03/2020   INSULIN  28.1 (H) 12/26/2018   Lab Results  Component Value Date   TSH 0.746 11/07/2022   Lab Results  Component Value Date   CHOL 127 07/16/2022   HDL 48 07/16/2022   LDLCALC 55 07/16/2022   TRIG 119 07/16/2022   CHOLHDL 2.6 07/16/2022   Lab Results  Component Value Date   VD25OH 33.5 01/11/2023   VD25OH 30.3 08/14/2022   VD25OH 39.8 09/28/2021   Lab Results  Component Value Date   WBC 3.9 (L) 04/18/2023   HGB 11.1 (L) 04/18/2023   HCT 34.6 (L) 04/18/2023   MCV 92.0 04/18/2023   PLT 178 04/18/2023   No results found for: IRON, TIBC, FERRITIN   Attestation Statements:   Reviewed by clinician on day of visit: allergies, medications,  problem list, medical history, surgical history, family history, social history, and previous encounter notes.  I have reviewed the above documentation for accuracy and completeness, and I agree with the above. -  Tish Begin d. Naziah Portee, NP-C

## 2023-05-08 LAB — HEMOGLOBIN A1C
Est. average glucose Bld gHb Est-mCnc: 157 mg/dL
Hgb A1c MFr Bld: 7.1 % — ABNORMAL HIGH (ref 4.8–5.6)

## 2023-05-08 LAB — VITAMIN B12: Vitamin B-12: 460 pg/mL (ref 232–1245)

## 2023-05-08 LAB — VITAMIN D 25 HYDROXY (VIT D DEFICIENCY, FRACTURES): Vit D, 25-Hydroxy: 51 ng/mL (ref 30.0–100.0)

## 2023-05-09 ENCOUNTER — Encounter (INDEPENDENT_AMBULATORY_CARE_PROVIDER_SITE_OTHER): Payer: Self-pay

## 2023-05-09 ENCOUNTER — Other Ambulatory Visit (INDEPENDENT_AMBULATORY_CARE_PROVIDER_SITE_OTHER): Payer: Self-pay | Admitting: Physician Assistant

## 2023-05-09 ENCOUNTER — Telehealth (INDEPENDENT_AMBULATORY_CARE_PROVIDER_SITE_OTHER): Payer: Self-pay | Admitting: Adult Health

## 2023-05-09 DIAGNOSIS — E559 Vitamin D deficiency, unspecified: Secondary | ICD-10-CM

## 2023-05-09 DIAGNOSIS — E1165 Type 2 diabetes mellitus with hyperglycemia: Secondary | ICD-10-CM

## 2023-05-09 NOTE — Telephone Encounter (Signed)
 Pt cld stating that her medications were sent to the wrong pharmacy. Patient needs Mounjaro 2.5 and Vitamin D  sent to the [facility] on [address]. Pt states she was to have her Mounjaro on this past Monday. Please call the patient once medication has been sent to [facility].

## 2023-06-05 ENCOUNTER — Encounter (INDEPENDENT_AMBULATORY_CARE_PROVIDER_SITE_OTHER): Payer: Self-pay | Admitting: Adult Health

## 2023-06-05 ENCOUNTER — Ambulatory Visit (INDEPENDENT_AMBULATORY_CARE_PROVIDER_SITE_OTHER): Payer: 59 | Admitting: Adult Health

## 2023-06-05 DIAGNOSIS — I152 Hypertension secondary to endocrine disorders: Secondary | ICD-10-CM

## 2023-06-05 DIAGNOSIS — E669 Obesity, unspecified: Secondary | ICD-10-CM

## 2023-06-05 DIAGNOSIS — Z7985 Long-term (current) use of injectable non-insulin antidiabetic drugs: Secondary | ICD-10-CM

## 2023-06-05 DIAGNOSIS — E559 Vitamin D deficiency, unspecified: Secondary | ICD-10-CM

## 2023-06-05 DIAGNOSIS — E1165 Type 2 diabetes mellitus with hyperglycemia: Secondary | ICD-10-CM

## 2023-06-05 DIAGNOSIS — Z6841 Body Mass Index (BMI) 40.0 and over, adult: Secondary | ICD-10-CM

## 2023-06-05 DIAGNOSIS — I509 Heart failure, unspecified: Secondary | ICD-10-CM

## 2023-06-05 DIAGNOSIS — E1159 Type 2 diabetes mellitus with other circulatory complications: Secondary | ICD-10-CM | POA: Diagnosis not present

## 2023-06-05 DIAGNOSIS — Z7984 Long term (current) use of oral hypoglycemic drugs: Secondary | ICD-10-CM

## 2023-06-05 MED ORDER — TIRZEPATIDE 5 MG/0.5ML ~~LOC~~ SOAJ: 5.0000 mg | SUBCUTANEOUS | 0 refills | Status: DC

## 2023-06-05 MED ORDER — VITAMIN D (ERGOCALCIFEROL) 1.25 MG (50000 UNIT) PO CAPS: 50000.0000 [IU] | ORAL_CAPSULE | ORAL | 0 refills | Status: DC

## 2023-06-05 NOTE — Progress Notes (Signed)
WEIGHT SUMMARY AND BIOMETRICS  Vitals Temp: 97.9 F (36.6 C) BP: 98/65 Pulse Rate: 78 SpO2: 98 %   Anthropometric Measurements Height: 4\' 11"  (1.499 m) Weight: 199 lb (90.3 kg) BMI (Calculated): 40.17 Weight at Last Visit: 202lb Weight Lost Since Last Visit: 3lb Weight Gained Since Last Visit: 0 Starting Weight: 209lb Total Weight Loss (lbs): 10 lb (4.536 kg)   Body Composition  Body Fat %: 48.5 % Fat Mass (lbs): 97 lbs Muscle Mass (lbs): 97.6 lbs Total Body Water (lbs): 75.4 lbs Visceral Fat Rating : 16   Other Clinical Data Fasting: yes Labs: yes Today's Visit #: 21 Starting Date: 06/01/21    Chief Complaint:   OBESITY [name] is here to discuss her progress with her obesity treatment plan.  She is on the the Category 2 Plan and states she is following her eating plan approximately 80 % of the time. She states she is exercising Walking 15 minutes 3 times per week.   Interim History:  She has been on loading dose Mounjaro 2.5mg  since Nov 2024. She reports increased appetite and noticeable CHO cravings. She denies GI upset.  Reviewed Bioim[employer] results with pt: Muscle Mass: 0.2 lb Adipose Mass: - 2 lbs  Subjective:   1. Hypertension associated with type 2 diabetes mellitus (HCC) BP soft at OV She denies sx's of hypotension She is on  atorvastatin (LIPITOR) 40 MG tablet  aspirin 81 MG chewable tablet  EPINEPHrine 0.3 mg/0.3 mL IJ SOAJ injection  nitroGLYCERIN (NITROSTAT) 0.3 MG SL tablet  carvedilol (COREG) 6.25 MG tablet   2. Type 2 diabetes mellitus with hyperglycemia, without long-term current use of insulin (HCC) Discussed Labs Lab Results  Component Value Date   HGBA1C 7.1 (H) 05/07/2023   HGBA1C 7.9 (H) 12/11/2022   HGBA1C 7.3 (H) 07/15/2022     Latest Reference Range & Units 05/07/23 11:47  Hemoglobin A1C 4.8 - 5.6 % 7.1 (H)  Est. average glucose Bld gHb Est-mCnc mg/dL 161  (H): Data is abnormally high  05/07/2023 B12 level-  normal at 460  A1c is improved and only slightly above goal of 7  She reports fasting CBG will range from 112-120s She denies sx's of hypoglycemia She is currently on weekly Mounjaro 2.5mg - been on the loading dose since 03/07/2023 She is currently on Metformin 500mg  1/2 tab daily Denies mass in neck, dysphagia, dyspepsia, persistent hoarseness, abdominal pain, or N/V/C   3. Vitamin D deficiency Discussed Labs  Latest Reference Range & Units 05/07/23 11:47  Vitamin D, 25-Hydroxy 30.0 - 100.0 ng/mL 51.0  Vitamin B12 232 - 1,245 pg/mL 460   Vit D and B12 levels stable  4. Heart failure, unspecified HF chronicity, unspecified heart failure type (HCC) She is on  atorvastatin (LIPITOR) 40 MG tablet  aspirin 81 MG chewable tablet  EPINEPHrine 0.3 mg/0.3 mL IJ SOAJ injection  nitroGLYCERIN (NITROSTAT) 0.3 MG SL tablet  carvedilol (COREG) 6.25 MG tablet   She is followed by [facility]  12/01/2022 ECHO  1. Left ventricular ejection fraction, by estimation, is 50 to 55%. The  left ventricle has low normal function. The left ventricle has no regional  wall motion abnormalities. Left ventricular diastolic parameters are  indeterminate. The average left  ventricular global longitudinal strain is -18.5 %. The global longitudinal  strain is normal.   Assessment/Plan:   1. Hypertension associated with type 2 diabetes mellitus (HCC) Check Labs - Comprehensive metabolic panel  F/u with Cards, re: BP trending down Monitor  for sx's of hypotension Discussed Red Flag sx's- if any develop seek immediate medical assistance. Pt. Verbalized understanding and agreement  [facility] contact provided to pt [facility] [phone]  2. Type 2 diabetes mellitus with hyperglycemia, without long-term current use of insulin (HCC) Check Labs - Insulin, random Refill and INCREASE tirzepatide (MOUNJARO) 5 MG/0.5ML Pen Inject 5 mg into the skin once a  week. Dispense: 6 mL, Refills: 0 ordered   Do not overeat  3. Vitamin D deficiency Refill Vitamin D, Ergocalciferol, (DRISDOL) 1.25 MG (50000 UNIT) CAPS capsule Take 1 capsule (50,000 Units total) by mouth every 7 (seven) days. Dispense: 4 capsule, Refills: 0 ordered   4. Heart failure, unspecified HF chronicity, unspecified heart failure type (HCC) Continue with weight loss efforts Continue with Cardiac Team  5. Obesity, current BMI of 40.17  [name] is currently in the action stage of change. As such, her goal is to continue with weight loss efforts. She has agreed to the Category 2 Plan.   Exercise goals: All adults should avoid inactivity. Some physical activity is better than none, and adults who participate in any amount of physical activity gain some health benefits. Adults should also include muscle-strengthening activities that involve all major muscle groups on 2 or more days a week.  Behavioral modification strategies: increasing lean protein intake, decreasing simple carbohydrates, increasing vegetables, increasing water intake, no skipping meals, meal planning and cooking strategies, keeping healthy foods in the home, ways to avoid boredom eating, and planning for success.  [name] has agreed to follow-up with our clinic in 4 weeks. She was informed of the importance of frequent follow-up visits to maximize her success with intensive lifestyle modifications for her multiple health conditions.   [name] was informed we would discuss her lab results at her next visit unless there is a critical issue that needs to be addressed sooner. [name] agreed to keep her next visit at the agreed upon time to discuss these results.  Objective:   Blood pressure 98/65, pulse 78, temperature 97.9 F (36.6 C), height 4\' 11"  (1.499 m), weight 199 lb (90.3 kg), SpO2 98%. Body mass index is 40.19 kg/m.  General: Cooperative, alert, well developed, in no acute distress. HEENT: Conjunctivae and  lids unremarkable. Cardiovascular: Regular rhythm.  Lungs: Normal work of breathing. Neurologic: No focal deficits.   Lab Results  Component Value Date   CREATININE 0.98 04/18/2023   BUN 14 04/18/2023   NA 141 04/18/2023   K 3.1 (L) 04/18/2023   CL 115 (H) 04/18/2023   CO2 19 (L) 04/18/2023   Lab Results  Component Value Date   ALT 17 04/18/2023   AST 16 04/18/2023   ALKPHOS 38 04/18/2023   BILITOT 0.7 04/18/2023   Lab Results  Component Value Date   HGBA1C 7.1 (H) 05/07/2023   HGBA1C 7.9 (H) 12/11/2022   HGBA1C 7.3 (H) 07/15/2022   HGBA1C 6.4 (H) 09/28/2021   HGBA1C 8.4 (H) 03/08/2021   Lab Results  Component Value Date   INSULIN 13.4 08/14/2022   INSULIN 22.1 09/28/2021   INSULIN 26.8 (H) 06/01/2021   INSULIN 44.4 (H) 02/03/2020   INSULIN 28.1 (H) 12/26/2018   Lab Results  Component Value Date   TSH 0.746 11/07/2022   Lab Results  Component Value Date   CHOL 127 07/16/2022   HDL 48 07/16/2022   LDLCALC 55 07/16/2022   TRIG 119 07/16/2022   CHOLHDL 2.6 07/16/2022   Lab Results  Component Value  Date   VD25OH 51.0 05/07/2023   VD25OH 33.5 01/11/2023   VD25OH 30.3 08/14/2022   Lab Results  Component Value Date   WBC 3.9 (L) 04/18/2023   HGB 11.1 (L) 04/18/2023   HCT 34.6 (L) 04/18/2023   MCV 92.0 04/18/2023   PLT 178 04/18/2023   No results found for: "IRON", "TIBC", "FERRITIN"  Attestation Statements:   Reviewed by clinician on day of visit: allergies, medications, problem list, medical history, surgical history, family history, social history, and previous encounter notes.  I have reviewed the above documentation for accuracy and completeness, and I agree with the above. -  Karrington Mccravy d. Promyse Ardito, NP-C

## 2023-06-06 LAB — COMPREHENSIVE METABOLIC PANEL
ALT: 30 [IU]/L (ref 0–32)
AST: 22 [IU]/L (ref 0–40)
Albumin: 4.2 g/dL (ref 3.9–4.9)
Alkaline Phosphatase: 72 [IU]/L (ref 44–121)
BUN/Creatinine Ratio: 14 (ref 12–28)
BUN: 13 mg/dL (ref 8–27)
Bilirubin Total: 0.5 mg/dL (ref 0.0–1.2)
CO2: 25 mmol/L (ref 20–29)
Calcium: 9.5 mg/dL (ref 8.7–10.3)
Chloride: 103 mmol/L (ref 96–106)
Creatinine, Ser: 0.92 mg/dL (ref 0.57–1.00)
Globulin, Total: 2.5 g/dL (ref 1.5–4.5)
Glucose: 103 mg/dL — ABNORMAL HIGH (ref 70–99)
Potassium: 3.9 mmol/L (ref 3.5–5.2)
Sodium: 143 mmol/L (ref 134–144)
Total Protein: 6.7 g/dL (ref 6.0–8.5)
eGFR: 70 mL/min/{1.73_m2} (ref >59)

## 2023-06-06 LAB — INSULIN, RANDOM: INSULIN: 20.2 u[IU]/mL (ref 2.6–24.9)

## 2023-06-07 ENCOUNTER — Ambulatory Visit: Payer: 59 | Attending: Nurse Practitioner | Admitting: Nurse Practitioner

## 2023-06-07 ENCOUNTER — Encounter: Payer: Self-pay | Admitting: Nurse Practitioner

## 2023-06-07 VITALS — BP 106/68 | HR 83 | Ht <= 58 in | Wt 207.2 lb

## 2023-06-07 DIAGNOSIS — E669 Obesity, unspecified: Secondary | ICD-10-CM

## 2023-06-07 DIAGNOSIS — I5030 Unspecified diastolic (congestive) heart failure: Secondary | ICD-10-CM

## 2023-06-07 DIAGNOSIS — I2510 Atherosclerotic heart disease of native coronary artery without angina pectoris: Secondary | ICD-10-CM | POA: Diagnosis not present

## 2023-06-07 DIAGNOSIS — E785 Hyperlipidemia, unspecified: Secondary | ICD-10-CM

## 2023-06-07 DIAGNOSIS — I10 Essential (primary) hypertension: Secondary | ICD-10-CM | POA: Diagnosis not present

## 2023-06-07 MED ORDER — CARVEDILOL 3.125 MG PO TABS: 3.1250 mg | ORAL_TABLET | Freq: Two times a day (BID) | ORAL | 3 refills | Status: DC

## 2023-06-07 NOTE — Progress Notes (Signed)
Cardiology Office Note    Patient Name: [name] Date of Encounter: 06/07/2023  Primary Care Provider:  Raymon Mutton., FNP Primary Cardiologist:  Charlton Haws, MD Primary Electrophysiologist: None   Past Medical History    Past Medical History:  Diagnosis Date   Allergy    Anginal pain [facility])    a. NL cath in 2008;  b. Myoview 03/2011: dec uptake along mid anterior wall on stress imaging -> ? attenuation vs. ischemia, EF 65%;  c. 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 2008 -> coumadin x 6 mos.   Rheumatoid arthritis (HCC)    Sleep apnea    on CPAP 02/2018   SOB (shortness of breath)    Thyroid disease    TIA (transient ischemic attack)    Urinary urgency    Vitamin D deficiency     History of Present Illness  [name] is a 63 y.o. female with PMH of breast CA s/p lumpectomy with XRT, HFpEF, OSA, remote hx of PE no longer on OAC, TIA, HTN, and HLD, atypical chest pain who presents today for 79-month follow-up.  [name] was last seen on 02/22/2023 by Jari Favre, PA after hospitalization for atypical chest pain that she described as sharp and dull.  She completed an ETT for similar discomfort one month prior  that was high risk and recommendation was made  for further evaluation by  left heart catheterization. She underwent R/LHC on 01/10/2023  and was diagnosed with nonobstructive CAD with 25% mid LAD stenosis and other findings unchanged from prior [facility] in 2016.  She completed a 2D echo on 12/01/2022 with EF of 50 to 55% and no RWMA trivial MVR. During her follow-up she expressed the desire to lose weight and is currently followed by healthy weight and wellness for management of Mounjaro. She was seen in the ED on 04/18/2023 with chest pain hemoptysis. She underwent CT was ruled out for PE.  She endorsed tenderness over the sternum on palpation with troponins negative.  She was found to have bronchitis and discharged later that day with instructions to follow-up as an outpatient.   [name] presents today for 86-month follow-up.  She reports since her last follow-up dizziness and heart palpitations that occur during water aerobics. These symptoms have caused her to stop attending the classes. She also experiences stiffness in her leg after the classes, which may be related to her known sciatica. The  patient has been making progress with weight loss, having lost 12 pounds recently under the guidance of the healthy weight and wellness.  She was euvolemic on examination today.  She is currently on several medications including carvedilol, spironolactone, and Mounjaro. She has been experiencing dry mouth, which she attributes to not drinking enough water. She also mentions that she has been eating less due to stress and the appetite-curbing effects of Mounjaro.    Review of Systems  Please see the history of present illness.    All other systems reviewed and are otherwise negative except as noted above.  Physical Exam    Wt Readings from Last 3 Encounters:  06/05/23 199 lb (90.3 kg)  05/07/23 202 lb (91.6 kg)  04/18/23 203 lb 0.7 oz (92.1 kg)   ZO:[id number] were no vitals filed for this visit.,There is no height or weight on file to calculate BMI. GEN:  Well nourished, well developed in no acute distress Neck: No JVD; No carotid bruits Pulmonary: Clear to auscultation without rales, wheezing or rhonchi  Cardiovascular: Normal rate. Regular rhythm. Normal S1. Normal S2.   Murmurs: There is no murmur.  ABDOMEN: Soft, non-tender, non-distended EXTREMITIES:  No edema; No deformity   EKG/LABS/ Recent Cardiac Studies   ECG personally reviewed by me today -none completed today provide he will  Risk Assessment/Calculations:          Lab Results  Component Value Date   WBC 3.9 (L) 04/18/2023   HGB 11.1 (L) 04/18/2023   HCT 34.6 (L) 04/18/2023   MCV 92.0 04/18/2023   PLT 178 04/18/2023   Lab Results  Component Value Date   CREATININE 0.92 06/05/2023   BUN 13 06/05/2023   NA 143 06/05/2023   K 3.9 06/05/2023   CL 103 06/05/2023   CO2 25 06/05/2023   Lab Results  Component Value Date   CHOL 127 07/16/2022   HDL 48 07/16/2022   LDLCALC 55 07/16/2022   TRIG 119 07/16/2022   CHOLHDL 2.6 07/16/2022    Lab Results  Component Value Date   HGBA1C 7.1 (H) 05/07/2023   Assessment & Plan    1.  HFpEF: -2D echo completed 11/2022 with EF of 50-55% with trivial MVR -She is euvolemic on exam and reports no episodes of shortness of breath or swelling over the past 6 months. -She does note some dizziness with exercise with occasional palpitations. -We will decrease carvedilol to 3.125 mg twice daily -Patient will continue current GDMT with losartan 25 mg, spironolactone 25 mg, Jardiance 10 mg, Lasix 40 mg daily -Low sodium diet, fluid restriction <2L, and daily weights encouraged. Educated to contact our office for weight gain of 2 lbs overnight or 5 lbs in one week.    2.  Primary hypertension: -Patient's blood pressure today was controlled at 106/68 -Continue losartan 25 mg daily, spironolactone 25 mg daily, carvedilol 3.125 mg twice daily -Patient was advised to document BP and report findings back to our office in 2 weeks.  3.   Hyperlipidemia: -Patient's last LDL cholesterol was 55 on 4/54/0981 -Continue Lipitor 40 mg daily and ASA 81 mg daily  4.  Nonobstructive CAD: -s/p R/LHC with 25% mid LAD stenosis otherwise minimal active disease. -Patient reports no recurrence of chest pain since previous follow-up. -Continue current GDMT with ASA 81 mg and Lipitor 40 p.o. daily  5.  Obesity: -Patient's BMI is 43.30 kg/m -She is currently on Mounjaro and followed by healthy weight and wellness.  Disposition: Follow-up with Charlton Haws,  MD or APP in 6 months    Signed, Napoleon Form, Leodis Rains, NP 06/07/2023, 7:22 AM [facility]

## 2023-06-07 NOTE — Patient Instructions (Signed)
Medication Instructions:  Decrease carvedilol to 3.125 mg twice daily. *If you need a refill on your cardiac medications before your next appointment, please call your pharmacy*  Lab Work: None ordered If you have labs (blood work) drawn today and your tests are completely normal, you will receive your results only by: MyChart Message (if you have MyChart) OR A paper copy in the mail If you have any lab test that is abnormal or we need to change your treatment, we will call you to review the results.  Follow-Up: At [facility], you and your health needs are our priority.  As part of our continuing mission to provide you with exceptional heart care, we have created designated Provider Care Teams.  These Care Teams include your primary Cardiologist (physician) and Advanced Practice Providers (APPs -  Physician Assistants and Nurse Practitioners) who all work together to provide you with the care you need, when you need it.  Your next appointment:   6 month(s)  Provider:   Robin Searing, NP        Other Instructions Check your blood pressure daily, 1-2 hrs after morning medications for 2 weeks, keep a log and send [name] the readings through mychart at the end of the 2 weeks.   Low-Sodium Eating Plan Salt (sodium) helps you keep a healthy balance of fluids in your body. Too much sodium can raise your blood pressure. It can also cause fluid and waste to be held in your body. Your health care provider or dietitian may recommend a low-sodium eating plan if you have high blood pressure (hypertension), kidney disease, liver disease, or heart failure. Eating less sodium can help lower your blood pressure and reduce swelling. It can also protect your heart, liver, and kidneys. What are tips for following this plan? Reading food labels  Check food labels for the amount of sodium per serving. If you eat more than one serving, you must multiply the listed amount by the number of servings. Choose  foods with less than 140 milligrams (mg) of sodium per serving. Avoid foods with 300 mg of sodium or more per serving. Always check how much sodium is in a product, even if the label says "unsalted" or "no salt added." Shopping  Buy products labeled as "low-sodium" or "no salt added." Buy fresh foods. Avoid canned foods and pre-made or frozen meals. Avoid canned, cured, or processed meats. Buy breads that have less than 80 mg of sodium per slice. Cooking  Eat more home-cooked food. Try to eat less restaurant, buffet, and fast food. Try not to add salt when you cook. Use salt-free seasonings or herbs instead of table salt or sea salt. Check with your provider or pharmacist before using salt substitutes. Cook with plant-based oils, such as canola, sunflower, or olive oil. Meal planning When eating at a restaurant, ask if your food can be made with less salt or no salt. Avoid dishes labeled as brined, pickled, cured, or smoked. Avoid dishes made with soy sauce, miso, or teriyaki sauce. Avoid foods that have monosodium glutamate (MSG) in them. MSG may be added to some restaurant food, sauces, soups, bouillon, and canned foods. Make meals that can be grilled, baked, poached, roasted, or steamed. These are often made with less sodium. General information Try to limit your sodium intake to 1,500-2,300 mg each day, or the amount told by your provider. What foods should I eat? Fruits Fresh, frozen, or canned fruit. Fruit juice. Vegetables Fresh or frozen vegetables. "No salt  added" canned vegetables. "No salt added" tomato sauce and paste. Low-sodium or reduced-sodium tomato and vegetable juice. Grains Low-sodium cereals, such as oats, puffed wheat and rice, and shredded wheat. Low-sodium crackers. Unsalted rice. Unsalted pasta. Low-sodium bread. Whole grain breads and whole grain pasta. Meats and other proteins Fresh or frozen meat, poultry, seafood, and fish. These should have no added salt.  Low-sodium canned tuna and salmon. Unsalted nuts. Dried peas, beans, and lentils without added salt. Unsalted canned beans. Eggs. Unsalted nut butters. Dairy Milk. Soy milk. Cheese that is naturally low in sodium, such as ricotta cheese, fresh mozzarella, or Swiss cheese. Low-sodium or reduced-sodium cheese. Cream cheese. Yogurt. Seasonings and condiments Fresh and dried herbs and spices. Salt-free seasonings. Low-sodium mustard and ketchup. Sodium-free salad dressing. Sodium-free light mayonnaise. Fresh or refrigerated horseradish. Lemon juice. Vinegar. Other foods Homemade, reduced-sodium, or low-sodium soups. Unsalted popcorn and pretzels. Low-salt or salt-free chips. The items listed above may not be all the foods and drinks you can have. Talk to a dietitian to learn more. What foods should I avoid? Vegetables Sauerkraut, pickled vegetables, and relishes. Olives. [place] fries. Onion rings. Regular canned vegetables, except low-sodium or reduced-sodium items. Regular canned tomato sauce and paste. Regular tomato and vegetable juice. Frozen vegetables in sauces. Grains Instant hot cereals. Bread stuffing, pancake, and biscuit mixes. Croutons. Seasoned rice or pasta mixes. Noodle soup cups. Boxed or frozen macaroni and cheese. Regular salted crackers. Self-rising flour. Meats and other proteins Meat or fish that is salted, canned, smoked, spiced, or pickled. Precooked or cured meat, such as sausages or meat loaves. Tomasa Blase. Ham. Pepperoni. Hot dogs. Corned beef. Chipped beef. Salt pork. Jerky. Pickled herring, anchovies, and sardines. Regular canned tuna. Salted nuts. Dairy Processed cheese and cheese spreads. Hard cheeses. Cheese curds. Blue cheese. Feta cheese. String cheese. Regular cottage cheese. Buttermilk. Canned milk. Fats and oils Salted butter. Regular margarine. Ghee. Bacon fat. Seasonings and condiments Onion salt, garlic salt, seasoned salt, table salt, and sea salt. Canned and  packaged gravies. Worcestershire sauce. Tartar sauce. Barbecue sauce. Teriyaki sauce. Soy sauce, including reduced-sodium soy sauce. Steak sauce. Fish sauce. Oyster sauce. Cocktail sauce. Horseradish that you find on the shelf. Regular ketchup and mustard. Meat flavorings and tenderizers. Bouillon cubes. Hot sauce. Pre-made or packaged marinades. Pre-made or packaged taco seasonings. Relishes. Regular salad dressings. Salsa. Other foods Salted popcorn and pretzels. Corn chips and puffs. Potato and tortilla chips. Canned or dried soups. Pizza. Frozen entrees and pot pies. The items listed above may not be all the foods and drinks you should avoid. Talk to a dietitian to learn more. This information is not intended to replace advice given to you by your health care provider. Make sure you discuss any questions you have with your health care provider. Document Revised: 04/27/2022 Document Reviewed: 04/27/2022 Elsevier Patient Education  2024 [employer].

## 2023-06-11 ENCOUNTER — Ambulatory Visit: Payer: 59 | Admitting: Cardiovascular Disease

## 2023-06-14 ENCOUNTER — Encounter (INDEPENDENT_AMBULATORY_CARE_PROVIDER_SITE_OTHER): Payer: Self-pay | Admitting: Adult Health

## 2023-06-20 NOTE — Telephone Encounter (Signed)
 Pt called at 0815 on 06/20/2023  Pt verified via two identifiers  Pt shared that she administered Mounjaro 5mg  on 06/08/2023  06/10/2023 she experienced constipation and BRBPR with stools. She denied abdominal pain.  She contacted [facility] and advised to be hold Mounjaro and be evaluated by her PCP.  She was seen by [facility] provider 06/18/2023- unable to locate visit noted in CareEverywhere. Per pt- Fecal Occult Blood was negative and hemorrhoids were identified.   Last Colonoscopy 06/10/2021- repeat study in 5 years.  She denies known family hx of colon cancer.  She reports last stool with BRBPR on 06/13/2023- denies any further hemtochezia.  Recommend that she restart weekly Mounjaro 5mg  today and monitor closely for SE.  She has [facility] OV on 07/04/2023- please keep appt.  Please contact [facility] with any questions/concerns.  All questions/concerns addressed at length.  -Denina Rieger d. Jahnyla Parrillo, NP-C

## 2023-07-02 NOTE — Telephone Encounter (Signed)
N/a. error ?

## 2023-07-04 ENCOUNTER — Encounter (INDEPENDENT_AMBULATORY_CARE_PROVIDER_SITE_OTHER): Payer: Self-pay | Admitting: Physician Assistant

## 2023-07-04 ENCOUNTER — Ambulatory Visit (INDEPENDENT_AMBULATORY_CARE_PROVIDER_SITE_OTHER): Payer: 59 | Admitting: Physician Assistant

## 2023-07-04 VITALS — BP 105/72 | HR 80 | Temp 97.9°F | Ht 59.0 in | Wt 196.0 lb

## 2023-07-04 DIAGNOSIS — Z7984 Long term (current) use of oral hypoglycemic drugs: Secondary | ICD-10-CM

## 2023-07-04 DIAGNOSIS — T8089XA Other complications following infusion, transfusion and therapeutic injection, initial encounter: Secondary | ICD-10-CM | POA: Diagnosis not present

## 2023-07-04 DIAGNOSIS — E1165 Type 2 diabetes mellitus with hyperglycemia: Secondary | ICD-10-CM

## 2023-07-04 DIAGNOSIS — E559 Vitamin D deficiency, unspecified: Secondary | ICD-10-CM | POA: Diagnosis not present

## 2023-07-04 DIAGNOSIS — T8090XA Unspecified complication following infusion and therapeutic injection, initial encounter: Secondary | ICD-10-CM

## 2023-07-04 DIAGNOSIS — E1159 Type 2 diabetes mellitus with other circulatory complications: Secondary | ICD-10-CM

## 2023-07-04 DIAGNOSIS — E669 Obesity, unspecified: Secondary | ICD-10-CM | POA: Diagnosis not present

## 2023-07-04 DIAGNOSIS — I5032 Chronic diastolic (congestive) heart failure: Secondary | ICD-10-CM

## 2023-07-04 DIAGNOSIS — Z6839 Body mass index (BMI) 39.0-39.9, adult: Secondary | ICD-10-CM

## 2023-07-04 DIAGNOSIS — F32A Depression, unspecified: Secondary | ICD-10-CM

## 2023-07-04 MED ORDER — METFORMIN HCL 500 MG PO TABS: ORAL_TABLET | ORAL | 0 refills | Status: DC

## 2023-07-04 MED ORDER — VITAMIN D (ERGOCALCIFEROL) 1.25 MG (50000 UNIT) PO CAPS: 50000.0000 [IU] | ORAL_CAPSULE | ORAL | 0 refills | Status: DC

## 2023-07-04 NOTE — Progress Notes (Unsigned)
 SUBJECTIVE: Discussed the use of AI scribe software for clinical note transcription with the patient, who gave verbal consent to proceed.  Chief Complaint: Obesity  Interim History: She is down 3 lbs since her last visit. Down 13 lbs overall TBW loss of 6.2%  Muscle mass + 1.6 lbs Adipose mass -4.6 lbs   [name] is here to discuss her progress with her obesity treatment plan. She is on the Category 2 Plan and states she is following her eating plan approximately 80 % of the time. She states she is exercising water aerobics 45-60 minutes 3 times per week.  Pharmacotherapy: loading dose Mounjaro 2.5mg  since Nov 2024- Increased to 5 mg 06/05/23- but experienced increased issues with constipation/hematochezia which has resolved. She was seen by her PCP at [facility] and fecal occult blood was negative and she was found to have hemorrhoids.She reports no further episodes of BRBPR. Colonoscopy 06/10/21- Planned for repeat in 5 yrs   Today she comes in with concerns for itching, bruising and redness at her injection site with residual itching over the injection area which persists beyond the week of injection. She is concerned for allergic reaction to [facility].    She is a 63 year old with obesity, type 2 diabetes, hypertension, and chronic diastolic heart failure who presents for follow-up of her obesity treatment plan. She normally sees [name] NP for her obesity treatment plan.  She has been using Mounjaro for her obesity treatment but temporarily stopped due to constipation and hematochezia. She drinks 64 ounces of water daily to prevent constipation and has not used MiraLAX recently. The constipation has subsided, and a recent examination did not reveal any specific cause for the hematochezia other than hemorrhoids.  Her main concern today is bruising and itching at the injection sites of [facility], which she administers in her stomach and legs. The bruising is described as a 'big  red bruise' that itches intensely. She uses Aquaphor to alleviate the itching but has not used hydrocortisone cream. She cleans the injection site with alcohol and allows it to dry completely. She has a history of using Ozempic, which caused diarrhea and stomach pain, leading to its discontinuation. She is concerned about a potential allergic reaction to [facility], as the itching persists for numerous days after the injection. She does not note any other signs of truncal rash, no SOB, no swelling of face, tongue or lips or other systemic signs for allergic reaction. She does not some red itchy areas over her arms. She is also on aspirin 81 mg daily, which may contribute to bruising.   She is also managing her diabetes with a "half dose of metformin", which does not cause stomach upset. She also takes hydroxyzine 25 mg up to three times daily as needed for itching but has not used it recently.  She is on vitamin D supplementation and reports no adverse effects such as nausea or vomiting.  No use of new soaps or lotions; she uses Dove soap. No systemic allergic reactions, only localized reactions at the injection sites.   OBJECTIVE: Visit Diagnoses: Problem List Items Addressed This Visit     Type 2 diabetes mellitus with hyperglycemia, without long-term current use of insulin (HCC)   Relevant Medications   metFORMIN (GLUCOPHAGE) 500 MG tablet   Vitamin D deficiency   Relevant Medications   Vitamin D, Ergocalciferol, (DRISDOL) 1.25 MG (50000 UNIT) CAPS capsule   Other Visit Diagnoses       Injection site reaction,  initial encounter    -  Primary     Obesity with starting BMI of 42.4       Relevant Medications   metFORMIN (GLUCOPHAGE) 500 MG tablet     BMI 39.0-39.9,adult Current BMI 39.7         Obesity [name] is on a treatment plan for obesity and has been using Mounjaro for Type 2 diabetes. She experienced increased constipation and hematochezia, leading to a temporary  discontinuation of Mounjaro. The constipation has subsided, and she is considering resuming the medication. MiraLAX was discussed as a management option for constipation, with an explanation that it pulls fluid into the colon to aid bowel movements. She expressed concern about potential side effects due to irritable bowel syndrome. MiraLAX typically does not exacerbate IBS symptoms. - We discussed considering resumption of Mounjaro with caution, but she then reported local skin reactions to the injection as noted.  - Recommend starting MiraLAX at half a capful and adjust as needed to manage constipation.  She is getting some results with Mounjaro with adipose loss and maintenance of muscle mass, but not sure she will be able to continue due to her concerns for local injection reactions/allergic reaction. See below.   Injection site reaction [name] reports localized itching and bruising at the injection site of Mounjaro, indicating a local allergic reaction. The reaction is not systemic, as it is confined to the injection site. The possibility of an allergic reaction to the medication or the injection process was discussed. [name] is concerned about the potential for a more severe allergic reaction. If the reaction is systemic, it could be dangerous, but the current reaction is manageable. The patient is hesitant to continue Mounjaro due to concerns of allergy.  The decision was made to hold the medication to assess if the symptoms resolve, indicating a possible allergy. - Hold Mounjaro injections until further evaluation - Schedule follow-up with William Hamburger, NP on July 09, 2023 - Consider using hydrocortisone cream for local reaction- Patient was not willing to try this at this time.  Consider use of antihistamine day of and the day following the injection- pt was hesitant to try this as well.  She has been rotating between stomach and thighs, but reports experiencing same reaction in either location.   She will hold [facility] for now and discuss further options at follow up next week.  - Monitor for any systemic allergic reactions  Type 2 Diabetes Mellitus [name] is managing type 2 diabetes with metformin and Mounjaro as noted. She reports no issues with the current half-dose regimen, and it is not causing gastrointestinal upset. Lab Results  Component Value Date   HGBA1C 7.1 (H) 05/07/2023   HGBA1C 7.9 (H) 12/11/2022   HGBA1C 7.3 (H) 07/15/2022   Lab Results  Component Value Date   MICROALBUR 0.51 03/01/2010   LDLCALC 55 07/16/2022   CREATININE 0.92 06/05/2023   A1c not at goal but improving.  She is working  on nutrition plan to decrease simple carbohydrates, increase lean proteins and exercise to promote weight loss and improve glycemic control . Hopefully will be able to feel comfortable and resume Mounjaro as appeared to be responding well following switch to [facility].  - Refill metformin 500 mg 1/2 tablet daily Meds ordered this encounter  Medications   Vitamin D, Ergocalciferol, (DRISDOL) 1.25 MG (50000 UNIT) CAPS capsule    Sig: Take 1 capsule (50,000 Units total) by mouth every 7 (seven) days.    Dispense:  4 capsule  Refill:  0   metFORMIN (GLUCOPHAGE) 500 MG tablet    Sig: 1/2 tab daily with breakfast    Dispense:  30 tablet    Refill:  0    Vitamin D Deficiency Vitamin D is at goal of 50.  Most recent vitamin D level was 51.0. She is on  prescription ergocalciferol 50,000 IU weekly. No N/V or muscle weakness with Ergo.  Lab Results  Component Value Date   VD25OH 51.0 05/07/2023   VD25OH 33.5 01/11/2023   VD25OH 30.3 08/14/2022    Plan: Continue and refill  prescription ergocalciferol 50,000 IU weekly Low vitamin D levels can be associated with adiposity and may result in leptin resistance and weight gain. Also associated with fatigue.  Currently on vitamin D supplementation without any adverse effects such as nausea, vomiting or muscle weakness.    Vitals Temp: 97.9 F (36.6 C) BP: 105/72 Pulse Rate: 80 SpO2: 97 %   Anthropometric Measurements Height: 4\' 11"  (1.499 m) Weight: 196 lb (88.9 kg) BMI (Calculated): 39.57 Weight at Last Visit: 199 lb Weight Lost Since Last Visit: 3 lb Weight Gained Since Last Visit: 0 Starting Weight: 209 lb Total Weight Loss (lbs): 13 lb (5.897 kg) Peak Weight: 245 lb   Body Composition  Body Fat %: 47 % Fat Mass (lbs): 92.4 lbs Muscle Mass (lbs): 99.2 lbs Total Body Water (lbs): 73.4 lbs Visceral Fat Rating : 15   Other Clinical Data Fasting: yes Labs: no Today's Visit #: 22 Starting Date: 06/01/21     ASSESSMENT AND PLAN:  Diet: [name] is currently in the action stage of change. As such, her goal is to continue with weight loss efforts and has agreed to the Category 2 Plan.   Exercise:  For substantial health benefits, adults should do at least 150 minutes (2 hours and 30 minutes) a week of moderate-intensity, or 75 minutes (1 hour and 15 minutes) a week of vigorous-intensity aerobic physical activity, or an equivalent combination of moderate- and vigorous-intensity aerobic activity. Aerobic activity should be performed in episodes of at least 10 minutes, and preferably, it should be spread throughout the week. and Adults should also include muscle-strengthening activities that involve all major muscle groups on 2 or more days a week.  Behavior Modification:  We discussed the following Behavioral Modification Strategies today: increasing lean protein intake, decreasing simple carbohydrates, increasing vegetables, increase H2O intake, increase high fiber foods, no skipping meals, avoiding temptations, and planning for success. We discussed various medication options to help Sadako with her weight loss efforts and we both agreed to hold Mounjaro until follow up appointment next week and continue metformin for Type 2 diabetes, continue to work on nutritional and behavioral  strategies to promote weight loss.  .  Return in about 1 week (around 07/11/2023).[name] She was informed of the importance of frequent follow up visits to maximize her success with intensive lifestyle modifications for her multiple health conditions.  Attestation Statements:   Reviewed by clinician on day of visit: allergies, medications, problem list, medical history, surgical history, family history, social history, and previous encounter notes.   Time spent on visit including pre-visit chart review and post-visit care and charting was 40 minutes  Rylend Pietrzak,PA-C

## 2023-07-09 ENCOUNTER — Telehealth (INDEPENDENT_AMBULATORY_CARE_PROVIDER_SITE_OTHER): Payer: Self-pay | Admitting: Adult Health

## 2023-07-09 ENCOUNTER — Encounter (INDEPENDENT_AMBULATORY_CARE_PROVIDER_SITE_OTHER): Payer: Self-pay

## 2023-07-09 ENCOUNTER — Ambulatory Visit (INDEPENDENT_AMBULATORY_CARE_PROVIDER_SITE_OTHER): Admitting: Adult Health

## 2023-07-09 ENCOUNTER — Encounter (INDEPENDENT_AMBULATORY_CARE_PROVIDER_SITE_OTHER): Payer: Self-pay | Admitting: Adult Health

## 2023-07-09 VITALS — BP 128/80 | HR 76 | Temp 97.7°F | Ht 59.0 in | Wt 200.0 lb

## 2023-07-09 DIAGNOSIS — E669 Obesity, unspecified: Secondary | ICD-10-CM

## 2023-07-09 DIAGNOSIS — F419 Anxiety disorder, unspecified: Secondary | ICD-10-CM

## 2023-07-09 DIAGNOSIS — E1159 Type 2 diabetes mellitus with other circulatory complications: Secondary | ICD-10-CM

## 2023-07-09 DIAGNOSIS — E559 Vitamin D deficiency, unspecified: Secondary | ICD-10-CM

## 2023-07-09 DIAGNOSIS — T8090XD Unspecified complication following infusion and therapeutic injection, subsequent encounter: Secondary | ICD-10-CM | POA: Diagnosis not present

## 2023-07-09 DIAGNOSIS — I152 Hypertension secondary to endocrine disorders: Secondary | ICD-10-CM

## 2023-07-09 DIAGNOSIS — Z7985 Long-term (current) use of injectable non-insulin antidiabetic drugs: Secondary | ICD-10-CM

## 2023-07-09 DIAGNOSIS — E1165 Type 2 diabetes mellitus with hyperglycemia: Secondary | ICD-10-CM

## 2023-07-09 DIAGNOSIS — Z6841 Body Mass Index (BMI) 40.0 and over, adult: Secondary | ICD-10-CM

## 2023-07-09 DIAGNOSIS — Z7984 Long term (current) use of oral hypoglycemic drugs: Secondary | ICD-10-CM

## 2023-07-09 DIAGNOSIS — Z6839 Body mass index (BMI) 39.0-39.9, adult: Secondary | ICD-10-CM

## 2023-07-09 DIAGNOSIS — F32A Depression, unspecified: Secondary | ICD-10-CM

## 2023-07-09 MED ORDER — VITAMIN D (ERGOCALCIFEROL) 1.25 MG (50000 UNIT) PO CAPS: 50000.0000 [IU] | ORAL_CAPSULE | ORAL | 0 refills | Status: DC

## 2023-07-09 MED ORDER — TIRZEPATIDE 2.5 MG/0.5ML ~~LOC~~ SOAJ: 2.5000 mg | SUBCUTANEOUS | 0 refills | Status: DC

## 2023-07-09 NOTE — Telephone Encounter (Signed)
 03/17 pt forgot ot ask for refill of vitiman D at her appt

## 2023-07-09 NOTE — Progress Notes (Signed)
 WEIGHT SUMMARY AND BIOMETRICS  Vitals Temp: 97.7 F (36.5 C) BP: 128/80 Pulse Rate: 76 SpO2: 98 %   Anthropometric Measurements Height: 4\' 11"  (1.499 m) Weight: 200 lb (90.7 kg) BMI (Calculated): 40.37 Weight at Last Visit: 196 lb Weight Lost Since Last Visit: 0 lb Weight Gained Since Last Visit: 4 lb Starting Weight: 209 lb Total Weight Loss (lbs): 9 lb (4.082 kg) Peak Weight: 245 lb   Body Composition  Body Fat %: 48.1 % Fat Mass (lbs): 96.6 lbs Muscle Mass (lbs): 99 lbs Total Body Water (lbs): 74.8 lbs Visceral Fat Rating : 16   Other Clinical Data Fasting: yes Labs: no Today's Visit #: 23 Starting Date: 06/01/21    Chief Complaint:   OBESITY [name] is here to discuss her progress with her obesity treatment plan.  She is on the the Category 2 Plan and states she is following her eating plan approximately 80 % of the time.  She states she is exercising Water Aerobics 45 minutes 3 times per week.  Interim History:  Loading dose Mounjaro 2.5mg  since Nov 2024- Increased to 5 mg 06/05/23 She experienced increased issues with constipation/hematochezia which has resolved.  She was seen by her PCP at [facility] and fecal occult blood was negative and she was found to have hemorrhoids.She reports no further episodes of BRBPR. Colonoscopy 06/10/21- Planned for repeat in 5 yrs   She has continued to experience localized reactions (ie: Bruising/Redness/Itching) after administering Mounjaro (either 2.5mg  or 5mg ). Last dose of Mounjaro 5mg  on/about 06/27/2023  She is up 4 lbs since last OV at [facility]   Exercise-Water Aerobics at least 3 x week  Subjective:   1. Injection site reaction, subsequent encounter Last dose of Mounjaro 5mg  was on 06/27/2023 When she injects Mounjaro- she has been rotating from abdomin to upper/outer thigh. She will experience a bruise at injection site. She will also experience itching and redness at site that will last for  days. She has used Aquaphor cream on site- no relief. She vehemently denies chest tightness/wheezing/tongue swelling/change in voice.  Reviewed at length the recommendations by last [facility] to help treat localized reaction.  2. Type 2 diabetes mellitus with hyperglycemia, without long-term current use of insulin (HCC) She is currently on daily Metformin 500mg  1/2 ([facility]) and dailyt Jardiance 10mg  (Cardiology). She was previously on Ozempic 0.5mg  - last used Dec 2023 Ozempic 0.5mg  was stopped due to weight gain. 03/07/2023 Mounjaro 2.5mg   06/05/2023 Mounjaro 2.5mg  increased to 5mg  She contacted [facility] 06/14/2023 re: injection site reactions. Lengthy discussion today about all antidiabetic medications and her tolerance of each. Metformin 500mg  1/2 tab daily is due to concerns of abdominal cramping and loose stools higher doses- recommended to change from plain formulation to XR- she declined Jardiance well tolerated Per pt she experienced abdominal bloating and weight gain with Ozempic therapy- last used Fall 2023.  She experienced bruising/itching/redness at injection site after first [facility] 2.5mg , not after first after increased dose of Mounjaro 5mg  She vehemently denies any systemic sx's- discussed at length.  Discussed at length the risks/benefits of Mounjaro therapy- GIP/GLP therapy is treating T2D, HLD, HTN, OSA, Obesity  3. Vitamin D deficiency  Latest Reference Range & Units 05/07/23 11:47  Vitamin D, 25-Hydroxy 30.0 - 100.0 ng/mL 51.0  Vitamin B12 232 - 1,245 pg/mL 460   Levels stable She is on weekly Ergocalciferol- denies N/V/Muscle Weakness  4. Hypertension associated with type 2 diabetes mellitus (HCC) BP excellent at OV She  denies acute cardiac sx's at present  5. Anxiety and depression She is on  busPIRone (BUSPAR) 30 MG tablet  hydrOXYzine (VISTARIL) 25 MG capsule  sertraline (ZOLOFT) 25 MG tablet   She is looking forward to starting a new job- orientation this  week.  Assessment/Plan:   1. Injection site reaction, subsequent encounter (Primary) hydrocortisone cream for local reaction- Patient was not willing to try this at this time.  Consider use of antihistamine day of and the day following the injection- pt was hesitant to try this as well.  She has been rotating between stomach and thighs  - Monitor for any systemic allergic reactions  2. Type 2 diabetes mellitus with hyperglycemia, without long-term current use of insulin (HCC) Refill and REDUCE tirzepatide (MOUNJARO) 2.5 MG/0.5ML Pen Inject 2.5 mg into the skin once a week. Dispense: 2 mL, Refills: 0 ordered   07/09/2023 -- Anwita Mencer, Orpha Bur D, NP                              Apply ice after an hr after injection Apply OTC Hydrocortisone cream if itching/redness develops. Discussed Red Flag sx's- if any develop- seek immediate medical assistance. Contact [facility] with any concerns  3. Vitamin D deficiency Refill - Vitamin D, Ergocalciferol, (DRISDOL) 1.25 MG (50000 UNIT) CAPS capsule; Take 1 capsule (50,000 Units total) by mouth every 7 (seven) days.  Dispense: 4 capsule; Refill: 0  4. Hypertension associated with type 2 diabetes mellitus (HCC) Limit Na+ Continue regular exercise  5. Anxiety and depression Continue regular exercise Continue  busPIRone (BUSPAR) 30 MG tablet  hydrOXYzine (VISTARIL) 25 MG capsule  sertraline (ZOLOFT) 25 MG tablet   6. BMI 39.0-39.9,adult Current BMI 40.6  [name] is currently in the action stage of change. As such, her goal is to continue with weight loss efforts. She has agreed to the Category 2 Plan.   Exercise goals: All adults should avoid inactivity. Some physical activity is better than none, and adults who participate in any amount of physical activity gain some health benefits. Adults should also include muscle-strengthening activities that involve all major muscle groups on 2 or more days a week.  Behavioral modification strategies: increasing  lean protein intake, decreasing simple carbohydrates, increasing vegetables, increasing water intake, no skipping meals, meal planning and cooking strategies, keeping healthy foods in the home, and planning for success.  [name] has agreed to follow-up with our clinic in 4 weeks. She was informed of the importance of frequent follow-up visits to maximize her success with intensive lifestyle modifications for her multiple health conditions.   Objective:   Blood pressure 128/80, pulse 76, temperature 97.7 F (36.5 C), height 4\' 11"  (1.499 m), weight 200 lb (90.7 kg), SpO2 98%. Body mass index is 40.4 kg/m.  General: Cooperative, alert, well developed, in no acute distress. HEENT: Conjunctivae and lids unremarkable. Cardiovascular: Regular rhythm.  Lungs: Normal work of breathing. Neurologic: No focal deficits.   Lab Results  Component Value Date   CREATININE 0.92 06/05/2023   BUN 13 06/05/2023   NA 143 06/05/2023   K 3.9 06/05/2023   CL 103 06/05/2023   CO2 25 06/05/2023   Lab Results  Component Value Date   ALT 30 06/05/2023   AST 22 06/05/2023   ALKPHOS 72 06/05/2023   BILITOT 0.5 06/05/2023   Lab Results  Component Value Date   HGBA1C 7.1 (H) 05/07/2023   HGBA1C 7.9 (H) 12/11/2022   HGBA1C 7.3 (  H) 07/15/2022   HGBA1C 6.4 (H) 09/28/2021   HGBA1C 8.4 (H) 03/08/2021   Lab Results  Component Value Date   INSULIN 20.2 06/05/2023   INSULIN 13.4 08/14/2022   INSULIN 22.1 09/28/2021   INSULIN 26.8 (H) 06/01/2021   INSULIN 44.4 (H) 02/03/2020   Lab Results  Component Value Date   TSH 0.746 11/07/2022   Lab Results  Component Value Date   CHOL 127 07/16/2022   HDL 48 07/16/2022   LDLCALC 55 07/16/2022   TRIG 119 07/16/2022   CHOLHDL 2.6 07/16/2022   Lab Results  Component Value Date   VD25OH 51.0 05/07/2023   VD25OH 33.5 01/11/2023   VD25OH 30.3 08/14/2022   Lab Results  Component Value Date   WBC 3.9 (L) 04/18/2023   HGB 11.1 (L) 04/18/2023   HCT 34.6 (L)  04/18/2023   MCV 92.0 04/18/2023   PLT 178 04/18/2023   No results found for: "IRON", "TIBC", "FERRITIN"  Attestation Statements:   Reviewed by clinician on day of visit: allergies, medications, problem list, medical history, surgical history, family history, social history, and previous encounter notes.  I have reviewed the above documentation for accuracy and completeness, and I agree with the above. -  Argie Applegate d. Sincere Berlanga, NP-C

## 2023-07-25 ENCOUNTER — Emergency Department (HOSPITAL_COMMUNITY)

## 2023-07-25 ENCOUNTER — Emergency Department (HOSPITAL_COMMUNITY)
Admission: EM | Admit: 2023-07-25 | Discharge: 2023-07-25 | Disposition: A | Discharge status: Home / Self Care | Attending: Emergency Medicine | Admitting: Emergency Medicine

## 2023-07-25 ENCOUNTER — Other Ambulatory Visit: Payer: Self-pay

## 2023-07-25 DIAGNOSIS — E119 Type 2 diabetes mellitus without complications: Secondary | ICD-10-CM | POA: Insufficient documentation

## 2023-07-25 DIAGNOSIS — Z7982 Long term (current) use of aspirin: Secondary | ICD-10-CM | POA: Diagnosis not present

## 2023-07-25 DIAGNOSIS — R079 Chest pain, unspecified: Secondary | ICD-10-CM | POA: Diagnosis present

## 2023-07-25 DIAGNOSIS — I110 Hypertensive heart disease with heart failure: Secondary | ICD-10-CM | POA: Diagnosis not present

## 2023-07-25 DIAGNOSIS — R0789 Other chest pain: Secondary | ICD-10-CM | POA: Diagnosis not present

## 2023-07-25 DIAGNOSIS — I5032 Chronic diastolic (congestive) heart failure: Secondary | ICD-10-CM | POA: Diagnosis not present

## 2023-07-25 DIAGNOSIS — Z91040 Latex allergy status: Secondary | ICD-10-CM | POA: Insufficient documentation

## 2023-07-25 DIAGNOSIS — Z96652 Presence of left artificial knee joint: Secondary | ICD-10-CM | POA: Diagnosis not present

## 2023-07-25 DIAGNOSIS — Z79899 Other long term (current) drug therapy: Secondary | ICD-10-CM | POA: Diagnosis not present

## 2023-07-25 DIAGNOSIS — R052 Subacute cough: Secondary | ICD-10-CM

## 2023-07-25 DIAGNOSIS — I2510 Atherosclerotic heart disease of native coronary artery without angina pectoris: Secondary | ICD-10-CM | POA: Insufficient documentation

## 2023-07-25 DIAGNOSIS — Z7984 Long term (current) use of oral hypoglycemic drugs: Secondary | ICD-10-CM | POA: Insufficient documentation

## 2023-07-25 DIAGNOSIS — Z87891 Personal history of nicotine dependence: Secondary | ICD-10-CM | POA: Diagnosis not present

## 2023-07-25 DIAGNOSIS — Z96642 Presence of left artificial hip joint: Secondary | ICD-10-CM | POA: Diagnosis not present

## 2023-07-25 LAB — CBC
HCT: 43.8 % (ref 36.0–46.0)
Hemoglobin: 14 g/dL (ref 12.0–15.0)
MCH: 29.1 pg (ref 26.0–34.0)
MCHC: 32 g/dL (ref 30.0–36.0)
MCV: 91.1 fL (ref 80.0–100.0)
Platelets: 210 10*3/uL (ref 150–400)
RBC: 4.81 MIL/uL (ref 3.87–5.11)
RDW: 13.6 % (ref 11.5–15.5)
WBC: 4.7 10*3/uL (ref 4.0–10.5)
nRBC: 0 % (ref 0.0–0.2)

## 2023-07-25 LAB — BASIC METABOLIC PANEL WITH GFR
Anion gap: 8 (ref 5–15)
BUN: 15 mg/dL (ref 8–23)
CO2: 25 mmol/L (ref 22–32)
Calcium: 9.4 mg/dL (ref 8.9–10.3)
Chloride: 107 mmol/L (ref 98–111)
Creatinine, Ser: 1.03 mg/dL — ABNORMAL HIGH (ref 0.44–1.00)
GFR, Estimated: >60 mL/min (ref >60)
Glucose, Bld: 111 mg/dL — ABNORMAL HIGH (ref 70–99)
Potassium: 4.3 mmol/L (ref 3.5–5.1)
Sodium: 140 mmol/L (ref 135–145)

## 2023-07-25 LAB — TROPONIN I (HIGH SENSITIVITY)
Troponin I (High Sensitivity): 9 ng/L (ref <18)
Troponin I (High Sensitivity): 9 ng/L (ref <18)

## 2023-07-25 LAB — BRAIN NATRIURETIC PEPTIDE: B Natriuretic Peptide: 16.6 pg/mL (ref 0.0–100.0)

## 2023-07-25 LAB — CBG MONITORING, ED: Glucose-Capillary: 94 mg/dL (ref 70–99)

## 2023-07-25 MED ORDER — DOXYCYCLINE HYCLATE 100 MG PO CAPS: 100.0000 mg | ORAL_CAPSULE | Freq: Two times a day (BID) | ORAL | 0 refills | Status: DC

## 2023-07-25 MED ORDER — ALBUTEROL SULFATE HFA 108 (90 BASE) MCG/ACT IN AERS: 1.0000 | INHALATION_SPRAY | Freq: Four times a day (QID) | RESPIRATORY_TRACT | 0 refills | Status: AC | PRN

## 2023-07-25 MED ORDER — IOHEXOL 350 MG/ML SOLN
75.0000 mL | Freq: Once | INTRAVENOUS | Status: AC | PRN
  Administered 2023-07-25: 75 mL via INTRAVENOUS

## 2023-07-25 MED ORDER — ALBUTEROL SULFATE HFA 108 (90 BASE) MCG/ACT IN AERS: 1.0000 | INHALATION_SPRAY | Freq: Four times a day (QID) | RESPIRATORY_TRACT | 0 refills | Status: DC | PRN

## 2023-07-25 MED ORDER — METHYLPREDNISOLONE 4 MG PO TBPK: ORAL_TABLET | ORAL | 0 refills | Status: DC

## 2023-07-25 MED ORDER — KETOROLAC TROMETHAMINE 15 MG/ML IJ SOLN
15.0000 mg | Freq: Once | INTRAMUSCULAR | Status: AC
  Administered 2023-07-25: 15 mg via INTRAVENOUS
  Filled 2023-07-25: qty 1

## 2023-07-25 MED ORDER — ACETAMINOPHEN 500 MG PO TABS
1000.0000 mg | ORAL_TABLET | Freq: Once | ORAL | Status: AC
  Administered 2023-07-25: 1000 mg via ORAL
  Filled 2023-07-25: qty 2

## 2023-07-25 NOTE — ED Provider Notes (Signed)
 duplicate   Lonell Grandchild, MD 07/25/23 1026

## 2023-07-25 NOTE — ED Provider Notes (Signed)
 [place] EMERGENCY DEPARTMENT AT [facility] Provider Note  CSN: [id number] Arrival date & time: 07/25/23 0272  Chief Complaint(s) Cough, Shortness of Breath, Chest Pain, and spitting up blood  HPI [name] is a 63 y.o. female history of CHF, hyperlipidemia, diabetes, hypertension, prior PE not on anticoagulation presenting to the emergency department with chest pain.  Patient reports some chest pain, starting this morning, associate with cough.  Also reports some hoarseness.  She reports that she had some red tinge sputum but no gross hemoptysis.  No leg swelling.  No abdominal pain.  Pain not pleuritic.  No fevers or chills.     Past Medical History Past Medical History:  Diagnosis Date   Allergy    Anginal pain (HCC)    a. NL cath in 2008;  b. Myoview 03/2011: dec uptake along mid anterior wall on stress imaging -> ? attenuation vs. ischemia, EF 65%;  c. 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 2008 -> coumadin x 6 mos.   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   Back pain 06/01/2021   Right thyroid nodule 05/31/2021   At risk for impaired metabolic function 04/12/2020   Unilateral primary osteoarthritis, left hip    Hip arthritis 06/11/2018   Class 3 severe obesity with serious comorbidity and body mass index (BMI) of 40.0 to 44.9 in adult [facility]) 05/29/2018   Other fatigue 11/20/2017   Shortness of breath on exertion 11/20/2017   Type 2 diabetes mellitus with hyperglycemia, without long-term current use of insulin (HCC) 11/20/2017   Vitamin D deficiency 11/20/2017   Depression with anxiety 11/20/2017   Other hyperlipidemia 11/20/2017   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/04/2016   Presence of left artificial knee joint 09/20/2016   Arthritis of knee 08/22/2016   Cervical radiculopathy 07/26/2016   Cervical disc disorder with  radiculopathy 07/26/2016   Right arm pain 06/29/2016   Cervicalgia 06/29/2016   Primary osteoarthritis of left knee 06/29/2016   Hypersomnia with sleep apnea 11/18/2015   Lethargy 11/18/2015   Exertional chest pain 09/30/2014   Abnormal cardiac function test 09/29/2014   Chest pain with moderate risk for cardiac etiology 09/28/2014   HTN (hypertension) 09/28/2014   AKI (acute kidney injury) (HCC)    Paresthesia 06/18/2014   Numbness and tingling of left arm and leg    Unstable angina (HCC) 05/24/2014   OSA on CPAP 05/24/2014   CAD (coronary artery disease) 11/25/2012   S/P laparoscopic appendectomy 06/04/2012   Chronic diastolic congestive heart failure (HCC) 01/09/2012   Mediastinal mass 01/09/2012   SOB (shortness of breath) 06/20/2011   Mediastinal abnormality 06/20/2011   Dyslipidemia 12/23/2009    GLAUCOMA 12/23/2009   ARTHRITIS 12/23/2009   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 Authorizing Provider  ACCU-CHEK GUIDE test strip  10/28/21   [provider]  acetaminophen (TYLENOL) 500 MG tablet Take 1,000 mg by mouth 2 (two) times daily as needed for moderate pain.    [provider]  albuterol (PROVENTIL HFA;VENTOLIN HFA) 108 (90 Base) MCG/ACT inhaler Inhale 2 puffs into the lungs every 6 (six) hours as needed for wheezing or shortness of breath.    [provider]  aspirin 81 MG chewable tablet Chew 81 mg by mouth daily.    [provider]  atorvastatin (LIPITOR) 40 MG tablet Take 40 mg by mouth at bedtime.     [provider]  BIOTIN PO Take 1 tablet by mouth at bedtime.    [provider]  busPIRone (BUSPAR) 30 MG tablet Take 30 mg by mouth 2 (two) times daily. 12/18/19   [provider]  carvedilol (COREG) 3.125 MG tablet Take 1 tablet (3.125 mg total) by mouth 2 (two) times daily. 06/07/23   Gaston Islam., NP  cyclobenzaprine (FLEXERIL) 10 MG tablet Take 1 tablet (10 mg total) by mouth 2 (two) times daily as needed for muscle spasms. 02/15/23   Sloan Leiter, DO  diclofenac Sodium (VOLTAREN) 1 % GEL Apply 2 g topically 4 (four) times daily as needed (joint/muscle pain). 09/20/19   [provider]  empagliflozin (JARDIANCE) 10 MG TABS tablet Take 1 tablet (10 mg total) by mouth daily. 03/28/22   Arrien, York Ram, MD  EPINEPHrine 0.3 mg/0.3 mL IJ SOAJ injection Inject 0.3 mg into the muscle as needed for anaphylaxis. Follow package instructions as needed for severe allergy or anaphylactic reaction. 11/06/21   Sharman Cheek, MD  furosemide (LASIX) 40 MG tablet Take 1 tablet (40 mg total) by mouth daily as needed for edema or fluid (take in case of weight gain 2 to 3 lbs in 24 hrs or 5 lbs in 7 days.). Patient taking differently: Take 20 mg by mouth daily. 03/28/22 02/22/23  Arrien, York Ram, MD  hydrOXYzine (VISTARIL) 25 MG capsule Take 25 mg by mouth 3 (three) times daily as  needed for itching.    [provider]  ipratropium-albuterol (DUONEB) 0.5-2.5 (3) MG/3ML SOLN Take 3 mLs by nebulization every 6 (six) hours as needed (shortness of breath). 11/22/22   [provider]  KLOR-CON M10 10 MEQ tablet Take 10 mEq by mouth 2 (two) times daily. 12/07/22   [provider]  loperamide (IMODIUM) 2 MG capsule Take 1 capsule (2 mg total) by mouth 4 (four) times daily as needed for diarrhea or loose stools. 01/07/22   Long, Arlyss Repress, MD  losartan (COZAAR) 25 MG tablet Take 1 tablet (25 mg total) by mouth daily. 02/22/23 03/24/23  Sharlene Dory, PA-C  metFORMIN (GLUCOPHAGE) 500 MG tablet 1/2 tab daily with breakfast 07/04/23   Rayburn, Fanny Bien, PA-C  nitroGLYCERIN (NITROSTAT) 0.3 MG SL tablet Place 0.3 mg under the tongue every 5 (five) minutes as needed for chest pain.    [provider]  nystatin (MYCOSTATIN/NYSTOP) powder Apply 1 application topically 3 (three) times daily as needed for rash.    [provider]  ondansetron (ZOFRAN-ODT) 4 MG disintegrating tablet Take 1 tablet (4 mg total) by mouth every 8 (eight) hours as needed for nausea or vomiting. 10/15/22   Chestine Spore, Meghan R, PA-C  oxyCODONE (OXY IR/ROXICODONE) 5 MG immediate release tablet Take 1 tablet by mouth every 6 (six) hours as needed.    [provider]  pantoprazole (PROTONIX) 20  MG tablet Take 1 tablet (20 mg total) by mouth daily for 14 days. 02/15/23 03/01/23  Sloan Leiter, DO  sertraline (ZOLOFT) 25 MG tablet Take 1 tablet (25 mg total) by mouth daily. 04/03/23   Rayburn, Fanny Bien, PA-C  spironolactone (ALDACTONE) 25 MG tablet Take 1 tablet (25 mg total) by mouth every other day. 02/22/23 03/24/23  Sharlene Dory, PA-C  tirzepatide [facility]) 2.5 MG/0.5ML Pen Inject 2.5 mg into the skin once a week. 07/09/23   Danford, Orpha Bur D, NP  Vitamin D, Ergocalciferol, (DRISDOL) 1.25 MG (50000 UNIT) CAPS capsule Take 1 capsule (50,000 Units total) by mouth every 7 (seven) days. 07/09/23   Julaine Fusi, NP                                                                                                                                    Past Surgical History Past Surgical History:  Procedure Laterality Date   ABDOMINAL HYSTERECTOMY  2005   APPENDECTOMY     BREAST LUMPECTOMY WITH RADIOACTIVE SEED AND SENTINEL LYMPH NODE BIOPSY Right 11/16/2021   Procedure: RIGHT BREAST RADIOACTIVE SEED LOCALIZED LUMPECTOMY WITH RIGHT AXILLARY SENTINEL LYMPH NODE BIOPSY;  Surgeon: Manus Rudd, MD;  Location: [facility];  Service: General;  Laterality: Right;  LMA & PEC BLOCK   CARDIAC CATHETERIZATION     Normal   CARDIAC CATHETERIZATION N/A 09/30/2014   Procedure: Left Heart Cath and Coronary Angiography;  Surgeon: Tonny Bollman, MD;  Location: [facility] INVASIVE CV LAB;  Service: Cardiovascular;  Laterality: N/A;   LAPAROSCOPIC APPENDECTOMY N/A 06/03/2012   Procedure: APPENDECTOMY LAPAROSCOPIC;  Surgeon: Almond Lint, MD;  Location: [facility] OR;  Service: General;  Laterality: N/A;   Left knee surgery  2008   LEG SURGERY     RIGHT/LEFT HEART CATH AND CORONARY ANGIOGRAPHY N/A 01/10/2023   Procedure: RIGHT/LEFT HEART CATH AND CORONARY ANGIOGRAPHY;  Surgeon: Orbie Pyo, MD;  Location: [facility] INVASIVE CV LAB;  Service: Cardiovascular;  Laterality: N/A;   TONSILLECTOMY     TOTAL HIP  ARTHROPLASTY Left 06/11/2018   Procedure: LEFT TOTAL HIP ARTHROPLASTY ANTERIOR APPROACH;  Surgeon: Cammy Copa, MD;  Location: [facility] OR;  Service: Orthopedics;  Laterality: Left;   TOTAL KNEE ARTHROPLASTY Left 08/22/2016   Procedure: TOTAL KNEE ARTHROPLASTY;  Surgeon: Cammy Copa, MD;  Location: [facility] OR;  Service: Orthopedics;  Laterality: Left;   TUBAL LIGATION  1989   Family History Family History  Problem Relation Age of Onset   Emphysema Mother    Arthritis Mother    Heart failure Mother        alive @ 32   Stroke Mother    Diabetes Mother    Hypertension Mother    Hyperlipidemia Mother    Depression Mother    Anxiety disorder Mother    Heart disease Mother    Alcoholism Mother    Depression Father    Heart disease Father        died @ 39's.   Stroke Father    Diabetes Father    Hyperlipidemia Father    Hypertension Father    Bipolar disorder Father    Sleep apnea Father    Alcoholism Father    Drug abuse Father    Sudden death Father    Diabetes Sister    Asthma Brother    Breast cancer Maternal Aunt 32 - 8   Colon cancer Maternal Grandfather        dx after 50   Heart disease Paternal Grandfather    Breast cancer Cousin        dx 59s   Esophageal cancer Neg Hx    Stomach cancer Neg Hx    Rectal cancer Neg Hx     Social History Social History   Tobacco Use   Smoking status: Former    Current packs/day: 0.00    Average packs/day: 0.5 packs/day for 15.0 years (7.5 ttl pk-yrs)    Types: Cigarettes    Start date: 04/24/1970    Quit date: 04/24/1985    Years since quitting: 38.2   Smokeless tobacco: Never  Vaping Use   Vaping status: Never Used  Substance Use Topics   Alcohol use: No    Alcohol/week: 0.0 standard drinks of alcohol   Drug use: Not Currently    Types: Marijuana   Allergies Shellfish allergy, Hydrocodone-acetaminophen, Penicillins, Gabapentin, Latex, and Sulfa antibiotics  Review of Systems Review of Systems  All other systems  reviewed and are negative.   Physical Exam Vital Signs  I have reviewed the triage vital signs BP 132/75 (BP Location: Left Arm)   Pulse 68   Temp 98 F (36.7 C) (Oral)   Resp 19   SpO2 100%  Physical Exam Vitals and nursing note reviewed.  Constitutional:      General: She is not in acute distress.    Appearance: She is well-developed.  HENT:     Head: Normocephalic and atraumatic.     Mouth/Throat:     Mouth: Mucous membranes  are moist.  Eyes:     Pupils: Pupils are equal, round, and reactive to light.  Cardiovascular:     Rate and Rhythm: Normal rate and regular rhythm.     Heart sounds: No murmur heard. Pulmonary:     Effort: Pulmonary effort is normal. No respiratory distress.     Breath sounds: Normal breath sounds.  Abdominal:     General: Abdomen is flat.     Palpations: Abdomen is soft.     Tenderness: There is no abdominal tenderness.  Musculoskeletal:        General: No tenderness.     Right lower leg: No edema.     Left lower leg: No edema.  Skin:    General: Skin is warm and dry.  Neurological:     General: No focal deficit present.     Mental Status: She is alert. Mental status is at baseline.  Psychiatric:        Mood and Affect: Mood normal.        Behavior: Behavior normal.     ED Results and Treatments Labs (all labs ordered are listed, but only abnormal results are displayed) Labs Reviewed  BASIC METABOLIC PANEL WITH GFR - Abnormal; Notable for the following components:      Result Value   Glucose, Bld 111 (*)    Creatinine, Ser 1.03 (*)    All other components within normal limits  CBC  BRAIN NATRIURETIC PEPTIDE  CBG MONITORING, ED  TROPONIN I (HIGH SENSITIVITY)  TROPONIN I (HIGH SENSITIVITY)                                                                                                                          Radiology CT Angio Chest PE W/Cm &/Or Wo Cm Result Date: 07/25/2023 CLINICAL DATA:  Chest pain.  Positive D-dimer. EXAM: CT  ANGIOGRAPHY CHEST WITH CONTRAST TECHNIQUE: Multidetector CT imaging of the chest was performed using the standard protocol during bolus administration of intravenous contrast. Multiplanar CT image reconstructions and MIPs were obtained to evaluate the vascular anatomy. RADIATION DOSE REDUCTION: This exam was performed according to the departmental dose-optimization program which includes automated exposure control, adjustment of the mA and/or kV according to patient size and/or use of iterative reconstruction technique. CONTRAST:  75mL OMNIPAQUE IOHEXOL 350 MG/ML SOLN COMPARISON:  April 18, 2023.  December 07, 2022. FINDINGS: Cardiovascular: Satisfactory opacification of the pulmonary arteries to the segmental level. No evidence of pulmonary embolism. Normal heart size. No pericardial effusion. Mediastinum/Nodes: Stable thyroid enlargement particularly on right side as described on prior CT and ultrasound exams. No adenopathy is noted. Esophagus is unremarkable. Lungs/Pleura: Lungs are clear. No pleural effusion or pneumothorax. Upper Abdomen: No acute abnormality. Musculoskeletal: No chest wall abnormality. No acute or significant osseous findings. Review of the MIP images confirms the above findings. IMPRESSION: No definite evidence of pulmonary embolus. Electronically Signed   By: Lupita Raider M.D.   On: 07/25/2023 15:24   DG  Chest 2 View Result Date: 07/25/2023 CLINICAL DATA:  chest pain EXAM: CHEST - 2 VIEW COMPARISON:  04/18/2023. FINDINGS: Low lung volume. There are patchy atelectatic changes at the lung bases. Bilateral lung fields are otherwise clear. No dense consolidation or lung collapse. No pulmonary edema. Bilateral costophrenic angles are clear. Stable cardio-mediastinal silhouette. No acute osseous abnormalities. The soft tissues are within normal limits. IMPRESSION: No active cardiopulmonary disease. Electronically Signed   By: Jules Schick M.D.   On: 07/25/2023 09:43    Pertinent labs &  imaging results that were available during my care of the patient were reviewed by me and considered in my medical decision making (see MDM for details).  Medications Ordered in ED Medications  acetaminophen (TYLENOL) tablet 1,000 mg (1,000 mg Oral Given 07/25/23 1304)  iohexol (OMNIPAQUE) 350 MG/ML injection 75 mL (75 mLs Intravenous Contrast Given 07/25/23 1345)  ketorolac (TORADOL) 15 MG/ML injection 15 mg (15 mg Intravenous Given 07/25/23 1436)                                                                                                                                     Procedures Procedures  (including critical care time)  Medical Decision Making / ED Course   MDM:  63 year old presenting to the emergency department with cough, shortness of breath.  Patient well-appearing, physical examination unremarkable.  Lungs clear on exam.  Chest x-ray clear with no pneumonia, pneumothorax.  Unclear cause of symptoms, could represent URI given hoarseness, cough.  Patient has history of PE, very low concern for PE overall, but given coughing up reddish sputum, will check CT chest to rule out PE.  Low concern for ACS, will check troponin.  Very low concern for dissection, esophageal perforation or other dangerous process.  If workup is negative anticipate discharge  Clinical Course as of 07/25/23 1753  Wed Jul 25, 2023  1541 Signed out to Dr. Wilkie Aye pending repeat troponin. CTA negative for PE.  [WS]    Clinical Course User Index [WS] Lonell Grandchild, MD     Additional history obtained:  -External records from outside source obtained and reviewed including: Chart review including previous notes, labs, imaging, consultation notes including prior ER visit for similar sx   Lab Tests: -I ordered, reviewed, and interpreted labs.   The pertinent results include:   Labs Reviewed  BASIC METABOLIC PANEL WITH GFR - Abnormal; Notable for the following components:      Result Value    Glucose, Bld 111 (*)    Creatinine, Ser 1.03 (*)    All other components within normal limits  CBC  BRAIN NATRIURETIC PEPTIDE  CBG MONITORING, ED  TROPONIN I (HIGH SENSITIVITY)  TROPONIN I (HIGH SENSITIVITY)    Notable for normal troponin   Imaging Studies ordered: I ordered imaging studies including CTA chest On my interpretation imaging demonstrates no PE I independently visualized and interpreted imaging. I agree with  the radiologist interpretation   Medicines ordered and prescription drug management: Meds ordered this encounter  Medications   acetaminophen (TYLENOL) tablet 1,000 mg   iohexol (OMNIPAQUE) 350 MG/ML injection 75 mL   ketorolac (TORADOL) 15 MG/ML injection 15 mg    -I have reviewed the patients home medicines and have made adjustments as needed   Cardiac Monitoring: The patient was maintained on a cardiac monitor.  I personally viewed and interpreted the cardiac monitored which showed an underlying rhythm of: NSR  Social Determinants of Health:  Diagnosis or treatment significantly limited by social determinants of health: obesity   Reevaluation: After the interventions noted above, I reevaluated the patient and found that their symptoms have improved  Co morbidities that complicate the patient evaluation  Past Medical History:  Diagnosis Date   Allergy    Anginal pain (HCC)    a. NL cath in 2008;  b. Myoview 03/2011: dec uptake along mid anterior wall on stress imaging -> ? attenuation vs. ischemia, EF 65%;  c. 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 2008 -> coumadin x 6 mos.   Rheumatoid arthritis (HCC)    Sleep apnea    on CPAP 02/2018   SOB (shortness of breath)    Thyroid disease    TIA (transient ischemic attack)    Urinary urgency    Vitamin D deficiency       Dispostion: Disposition decision including need for hospitalization was considered, and patient disposition pending at time of sign out.    Final Clinical Impression(s) / ED Diagnoses Final diagnoses:  Atypical chest pain     This chart was dictated using voice recognition software.  Despite best efforts to proofread,  errors can occur which can change the documentation meaning.    Lonell Grandchild, MD 07/25/23 [phone]

## 2023-07-25 NOTE — ED Triage Notes (Signed)
 Pt. Stated, I ve been having a cough, SOB , and chest pain, and spitting up blood this morning, Ive had this many times.

## 2023-07-25 NOTE — ED Provider Notes (Signed)
 Patient signed out to me by previous provider. Please refer to their note for full HPI.  Briefly this is a 63 year old female who presented with chest pain and hemoptysis.  History of PE not on anticoagulation.  Patient signed out pending repeat troponin and PE study.  PE study is negative for pulmonary embolism.  Repeat troponin is negative with no significant delta.  In the setting of chills, cough, hemoptysis we will treat for possible bronchitis and put ambulatory referral for pulmonology.  Patient at this time appears safe and stable for discharge and close outpatient follow up. Discharge plan and strict return to ED precautions discussed, patient verbalizes understanding and agreement.   Rozelle Logan, DO 07/25/23 2055

## 2023-07-25 NOTE — Discharge Instructions (Signed)
 You have been seen and discharged from the emergency department.  Your workup showed no stress on the heart, no blood clot.  I believe you may be suffering from bronchitis like inflammation.  Please take medications as directed.  Use inhaler as prescribed.  An ambulatory referral has been placed for pulmonology.  If you do not hear from them in a week please contact the office.  Follow-up with your primary provider for further evaluation and further care. Take home medications as prescribed. If you have any worsening symptoms or further concerns for your health please return to an emergency department for further evaluation.  This

## 2023-07-26 ENCOUNTER — Encounter (INDEPENDENT_AMBULATORY_CARE_PROVIDER_SITE_OTHER): Payer: Self-pay | Admitting: Physician Assistant

## 2023-07-26 ENCOUNTER — Ambulatory Visit (INDEPENDENT_AMBULATORY_CARE_PROVIDER_SITE_OTHER): Admitting: Physician Assistant

## 2023-07-26 VITALS — BP 115/73 | HR 71 | Temp 97.9°F | Ht 59.0 in | Wt 203.0 lb

## 2023-07-26 DIAGNOSIS — I152 Hypertension secondary to endocrine disorders: Secondary | ICD-10-CM | POA: Diagnosis not present

## 2023-07-26 DIAGNOSIS — E1165 Type 2 diabetes mellitus with hyperglycemia: Secondary | ICD-10-CM | POA: Diagnosis not present

## 2023-07-26 DIAGNOSIS — Z7985 Long-term (current) use of injectable non-insulin antidiabetic drugs: Secondary | ICD-10-CM

## 2023-07-26 DIAGNOSIS — J40 Bronchitis, not specified as acute or chronic: Secondary | ICD-10-CM | POA: Diagnosis not present

## 2023-07-26 DIAGNOSIS — E1159 Type 2 diabetes mellitus with other circulatory complications: Secondary | ICD-10-CM | POA: Diagnosis not present

## 2023-07-26 DIAGNOSIS — E669 Obesity, unspecified: Secondary | ICD-10-CM

## 2023-07-26 DIAGNOSIS — Z6841 Body Mass Index (BMI) 40.0 and over, adult: Secondary | ICD-10-CM

## 2023-07-26 DIAGNOSIS — Z7984 Long term (current) use of oral hypoglycemic drugs: Secondary | ICD-10-CM

## 2023-07-26 MED ORDER — METFORMIN HCL 500 MG PO TABS: ORAL_TABLET | ORAL | 0 refills | Status: DC

## 2023-07-26 MED ORDER — TIRZEPATIDE 5 MG/0.5ML ~~LOC~~ SOAJ: 5.0000 mg | SUBCUTANEOUS | 0 refills | Status: DC

## 2023-07-26 NOTE — Progress Notes (Signed)
 SUBJECTIVE: Discussed the use of AI scribe software for clinical note transcription with the patient, who gave verbal consent to proceed.  Chief Complaint: Obesity  Interim History: She is up 3 lbs since her last visit.   [name] is here to discuss her progress with her obesity treatment plan. She is on the Category 2 Plan and states she is following her eating plan approximately 75-80 % of the time. She states she is exercising walking 45 minutes 3 times per week.  [name] is a 63 year old female with obesity who presents for follow-up of her obesity treatment plan.  She is currently on a category two nutrition plan, adhering to it 75-80% of the time, and exercises by walking for about 45 minutes three days a week.  She has been using Mounjaro for weight management. Initially, she was on a 5 mg dose but experienced bruising and itching, leading to a reduction to 2.5 mg. The 2.5 mg dose still causes bruising and itching, and she is uncertain about its effectiveness. She has cravings for sweet and salty foods and is unsure if the medication affects her appetite. She recalls losing a few pounds on the 5 mg dose but is concerned about the side effects.  She has a history of using other medications for weight management, including Ozempic and Topamax (topiramate). Ozempic caused weight gain and stomach issues, leading to a reduction in her metformin dose due to gastrointestinal side effects. She does not recall the effects of Topamax on her cravings.  Recently diagnosed with bronchitis, she is on a 6-day course of prednisone. She experienced a negative encounter at the ER, feeling lightheaded and having a poor experience with the staff. She has blood-tinged sputum and occasional chills but no fever today. She sleeps well, typically from 11 PM to 9:30 AM.  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 OBJECTIVE: Visit Diagnoses: Problem List Items Addressed This Visit     Type 2 diabetes mellitus with hyperglycemia, without long-term current use of insulin (HCC) - Primary   Relevant Medications   metFORMIN (GLUCOPHAGE) 500 MG tablet   tirzepatide (MOUNJARO) 5 MG/0.5ML Pen   Other Visit Diagnoses       Bronchitis         Hypertension associated with type 2 diabetes mellitus (HCC)       Relevant Medications   metFORMIN (GLUCOPHAGE) 500 MG tablet   tirzepatide (MOUNJARO) 5 MG/0.5ML Pen     Obesity with starting BMI of 42.4       Relevant Medications   metFORMIN (GLUCOPHAGE) 500 MG tablet   tirzepatide (MOUNJARO) 5 MG/0.5ML Pen     BMI 40.0-44.9, adult (HCC) Current BMI 41.2       Relevant Medications   metFORMIN (GLUCOPHAGE) 500 MG tablet   tirzepatide (MOUNJARO) 5 MG/0.5ML Pen     Obesity She is on a category two nutrition plan and exercises by walking for 45 minutes three days a week. Currently on Mounjaro 2.5 mg, she experiences bruising and itching at the injection site. The 2.5 mg dose is a loading dose and may not provide significant results. Previously on 5 mg, which resulted in some weight loss but increased bruising and itching. Increasing the dose to 5 mg may help with cravings and weight loss, but there is a risk of increased local reactions. She agreed to try the 5 mg dose again with the understanding that  she can stop if adverse reactions worsen. Greggory Keen is preferred over Rybelsus for weight loss, as Rybelsus may not provide the same benefits. - Increase Mounjaro to 5 mg and monitor for adverse reactions. - Consider Rybelsus if Greggory Keen is not tolerated.  Type 2 Diabetes Mellitus She is currently on metformin, Jardiance and Mounjaro.  Previously on Ozempic, which caused gastrointestinal issues and did not result in weight loss.  She continues to have concerns about bruising and itching at the site of injections. She has no signs or symptoms for systemic  allergic or other reaction.  Labs from ER visit show normal platelets, Hgb, Hct and Wbc.  Mounjaro benefits include preserving pancreatic function and preventing progression to insulin therapy.  Rybelsus is an alternative but may not provide the same weight loss benefits as injectable medications. Greggory Keen is preferred due to less frequent nausea and vomiting compared to Ozempic, where 58% of trial participants dropped out due to these side effects. - Continue metformin at current dose. - Consider Rybelsus if Greggory Keen is not tolerated. Meds ordered this encounter  Medications   metFORMIN (GLUCOPHAGE) 500 MG tablet    Sig: 1/2 tab daily with breakfast    Dispense:  30 tablet    Refill:  0   tirzepatide (MOUNJARO) 5 MG/0.5ML Pen    Sig: Inject 5 mg into the skin once a week.    Dispense:  2 mL    Refill:  0    Bronchitis Seen in the ER yesterday for atypical chest pain and cough with blood tinged sputum and chills.  Troponin negative. BNP 16.6 Remote history of PE and chest CT with contrast done and negative for PE.  Recently diagnosed with bronchitis and is on a 6-day course of prednisone. Not prescribed antibiotics as the condition may be viral. Experienced chills and lightheadedness during her ER visit, where she had a negative experience with the staff. Experiencing blood-tinged sputum, which may be related to coughing. She was reporting some hemoptysis and there was concern for PE which was ruled out on Chest CT scan. No arrhythmias during monitoring for hours in ER. CXR- NAD.  - Complete 6-day course of prednisone, doxycycline and albuterol MDI as ordered by ER and monitor.  Follow up with PCP if symptoms persist.  Hypertension Blood pressure is well-controlled at 115/73 mmHg. On losartan 25 mg daily Spironolactone 25 mg daily.  BP Readings from Last 3 Encounters:  07/26/23 115/73  07/25/23 (!) 148/92  07/09/23 128/80   Continue to work on nutrition plan to promote weight loss  and improve BP control.  Continue usual medications. Monitor for hypotension on Mounjaro- No signs or symptoms of hypotension currently.    Follow-up She is to be seen in 3 to 4 weeks to monitor her response to the increased Mounjaro dose and overall health status. Scheduling challenges were noted due to limited availability. - Schedule follow-up appointment in 3 to 4 weeks.  Vitals Temp: 97.9 F (36.6 C) BP: 115/73 Pulse Rate: 71 SpO2: 98 %   Anthropometric Measurements Height: 4\' 11"  (1.499 m) Weight: 203 lb (92.1 kg) BMI (Calculated): 40.98 Weight at Last Visit: 200 lb Weight Lost Since Last Visit: 0 Weight Gained Since Last Visit: 3 lb Starting Weight: 209 lb Total Weight Loss (lbs): 6 lb (2.722 kg) Peak Weight: 245 lb   Body Composition  Body Fat %: 49.2 % Fat Mass (lbs): 100.2 lbs Muscle Mass (lbs): 98.4 lbs Total Body Water (lbs): 77.2 lbs Visceral Fat Rating : 16  Other Clinical Data Fasting: no Labs: no Today's Visit #: 24 Starting Date: 06/01/21     ASSESSMENT AND PLAN:  Diet: [name] is currently in the action stage of change. As such, her goal is to continue with weight loss efforts. She has agreed to Category 2 Plan.  Exercise: [name] has been instructed to work up to a goal of 150 minutes of combined cardio and strengthening exercise per week for weight loss and overall health benefits.   Behavior Modification:  We discussed the following Behavioral Modification Strategies today: increasing lean protein intake, decreasing simple carbohydrates, increasing vegetables, increase H2O intake, increase high fiber foods, meal planning and cooking strategies, avoiding temptations, and planning for success. We discussed various medication options to help [name] with her weight loss efforts and we both agreed to continue to work on nutritional and behavioral strategies to promote weight loss.  .  Return in about 4 weeks (around 08/23/2023).[name] She was  informed of the importance of frequent follow up visits to maximize her success with intensive lifestyle modifications for her multiple health conditions.  Attestation Statements:   Reviewed by clinician on day of visit: allergies, medications, problem list, medical history, surgical history, family history, social history, and previous encounter notes.   Time spent on visit including pre-visit chart review and post-visit care and charting was 34 minutes.    Yarixa Lightcap, PA-C

## 2023-08-02 IMAGING — CT CT ANGIO CHEST
2 of 7 series · 18 of 46 positions shown · IV contrast (APPLIED)
Comparison: 03/07/2021

CLINICAL DATA: Chest pain and shortness of breath since yesterday,
high clinical suspicion of pulmonary embolism

EXAM:
CT ANGIOGRAPHY CHEST WITH CONTRAST
TECHNIQUE: Multidetector CT imaging of the chest was performed using the
standard protocol during bolus administration of intravenous
contrast. Multiplanar CT image reconstructions and MIPs were
obtained to evaluate the vascular anatomy.

[Series 7: thins · axial · 0.70mm/px · z∈[+947,+1228]mm · 15 of 453 slices shown]
[im 26/453  lung]
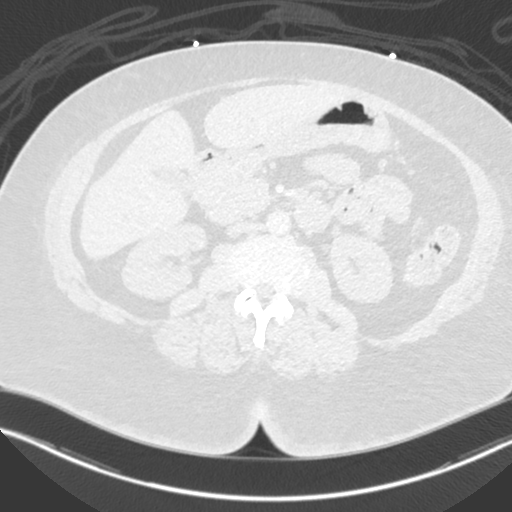
[im 51/453  soft-tissue]
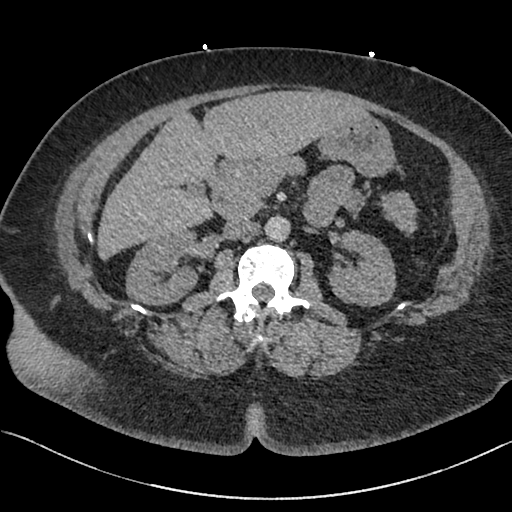
[im 76/453  lung]
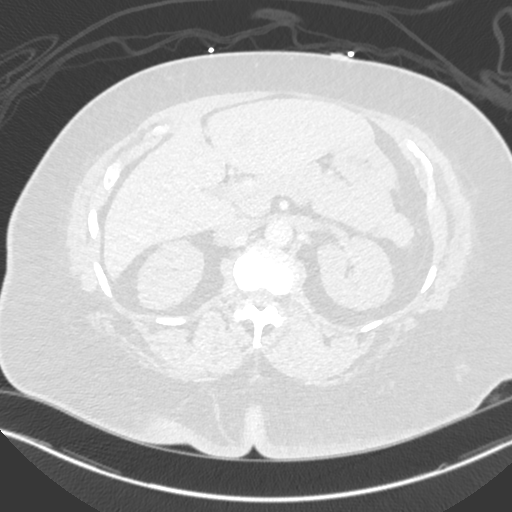
[im 101/453  soft-tissue]
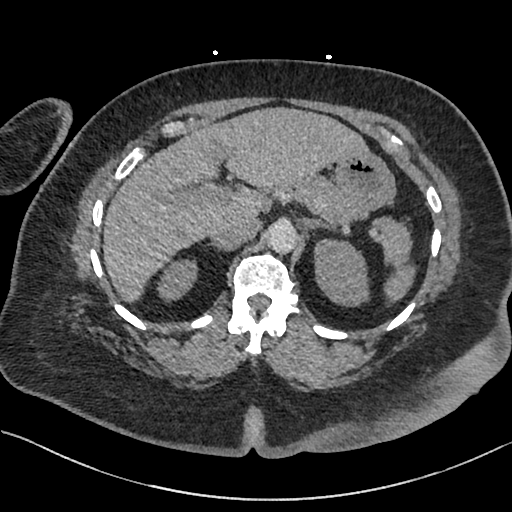
[im 151/453  lung]
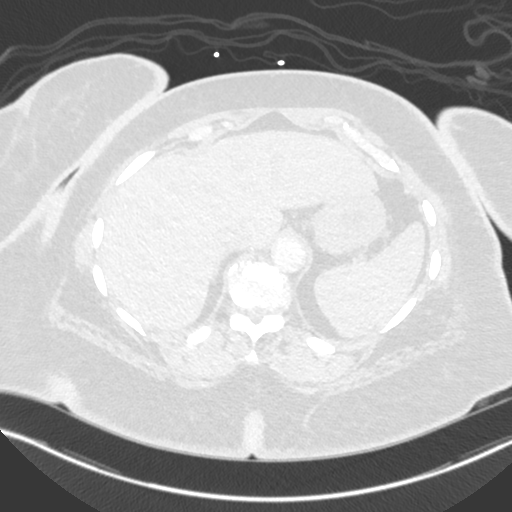
[im 176/453  soft-tissue]
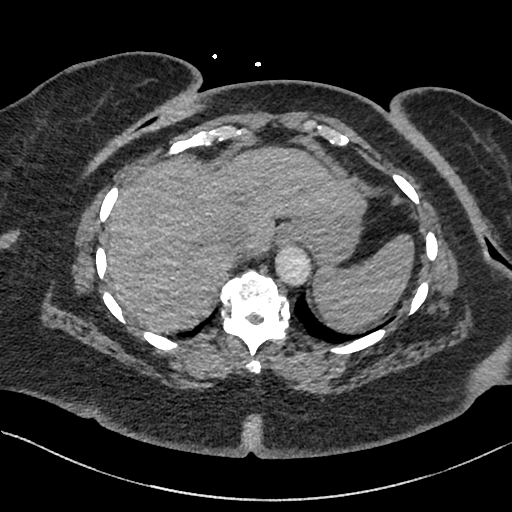
[im 201/453  lung]
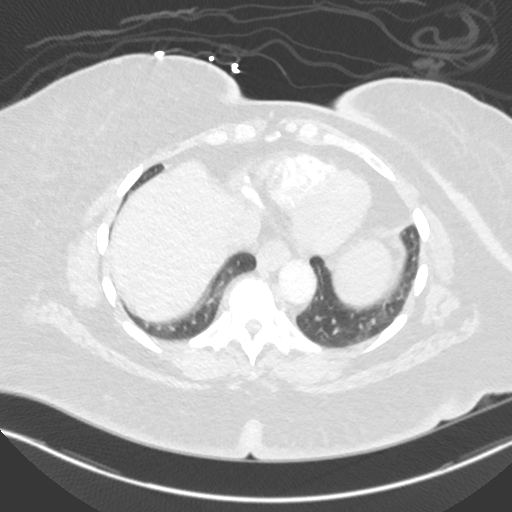
[im 227/453  soft-tissue]
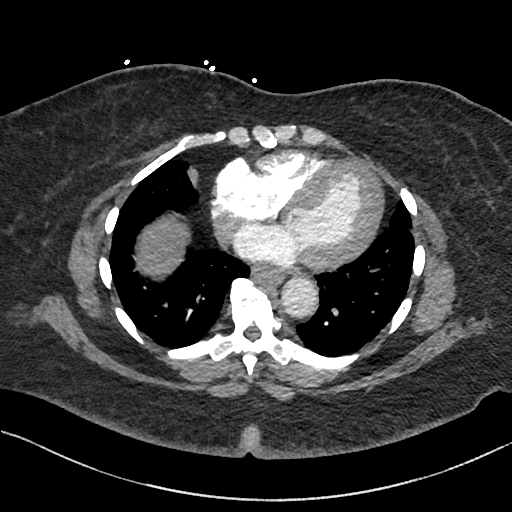
[im 252/453  lung]
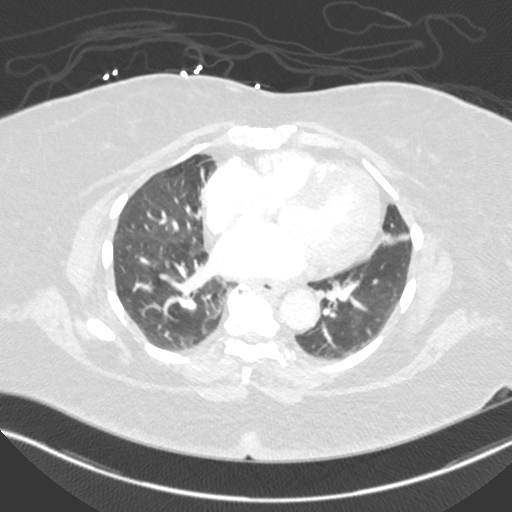
[im 277/453  soft-tissue]
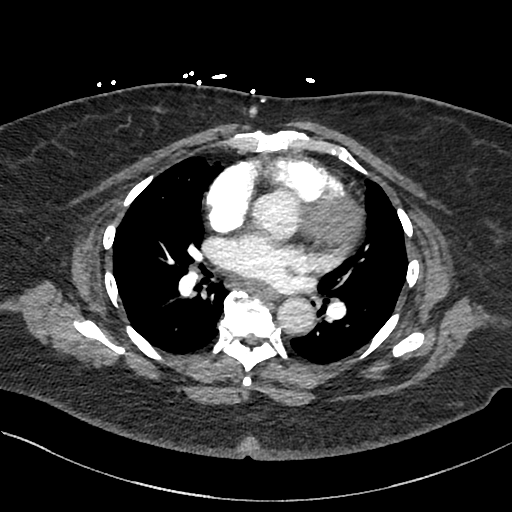
[im 302/453  lung]
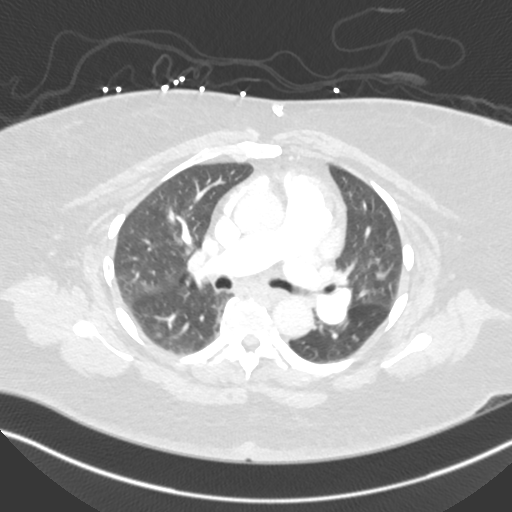
[im 352/453  soft-tissue]
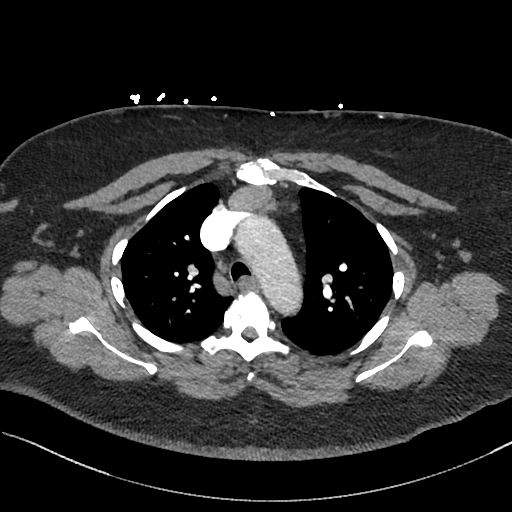
[im 377/453  lung]
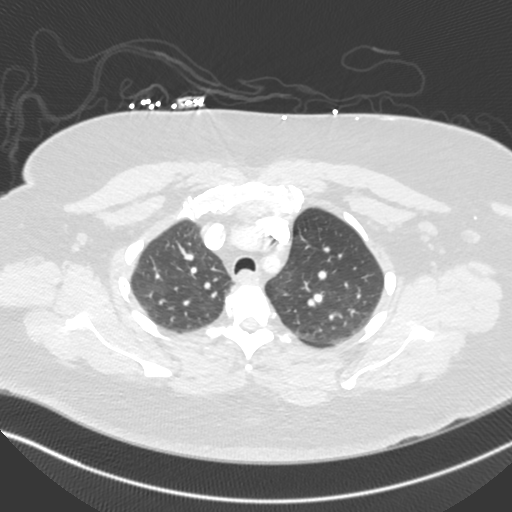
[im 402/453  soft-tissue]
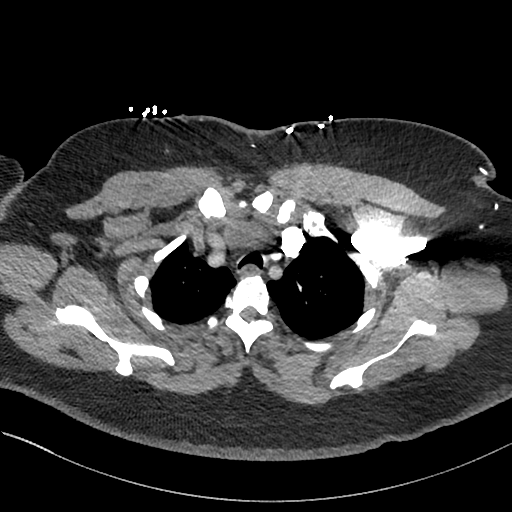
[im 427/453  lung]
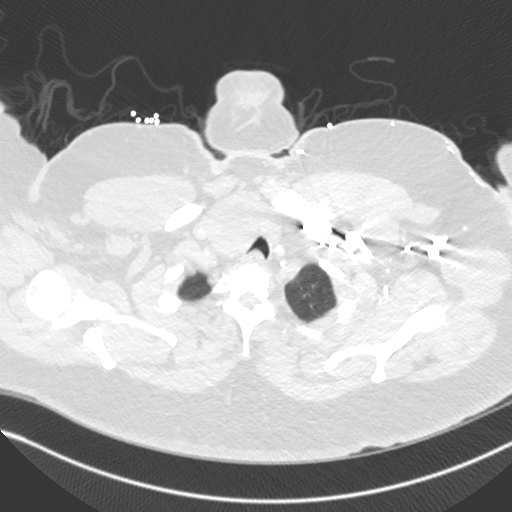

[Series 8: cor · coronal · 0.61mm/px · 3 of 150 slices shown]
[im 38/150  soft-tissue]
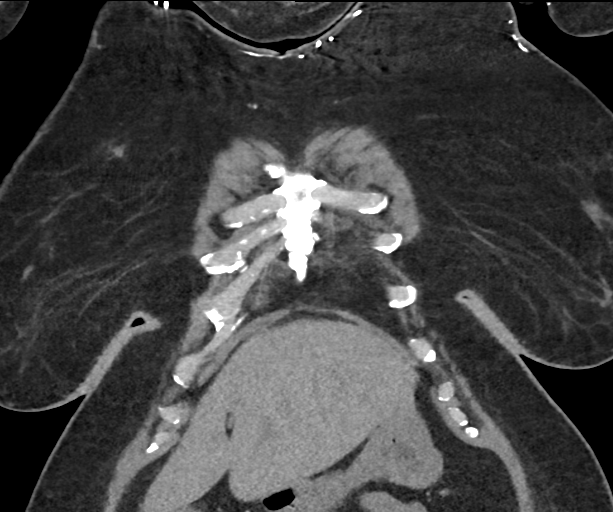
[im 75/150  soft-tissue]
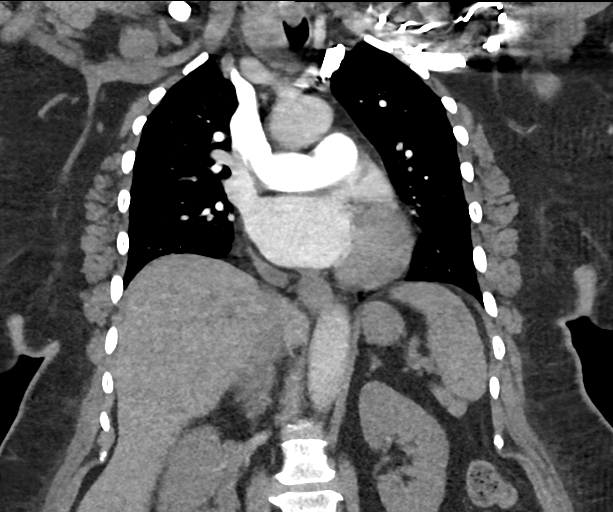
[im 112/150  soft-tissue]
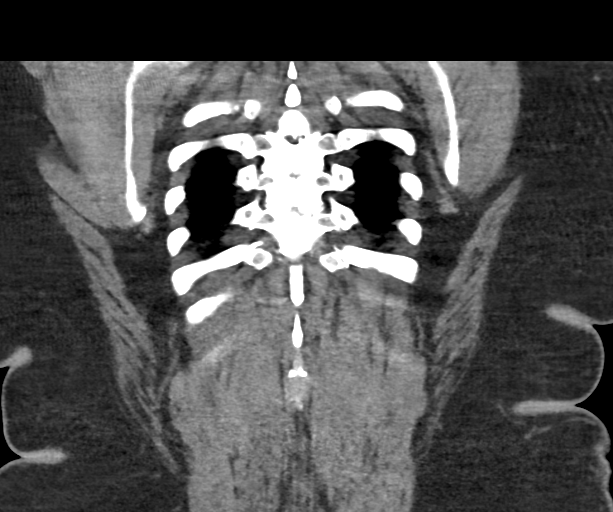

[18 of 46 positions shown; findings below may reference images not displayed]

RADIATION DOSE REDUCTION: This exam was performed according to the
departmental dose-optimization program which includes automated
exposure control, adjustment of the mA and/or kV according to
patient size and/or use of iterative reconstruction technique.

CONTRAST:  70mL OMNIPAQUE IOHEXOL 350 MG/ML SOLN IV
FINDINGS: Cardiovascular: Mild atherosclerotic calcifications aorta. Aorta
normal caliber without aneurysm or dissection. Heart unremarkable.
No pericardial effusion. Pulmonary arteries adequately opacified and
patent. No evidence of pulmonary embolism.

Mediastinum/Nodes: Enlargement of RIGHT thyroid lobe versus LEFT
with slight mass effect upon the RIGHT lateral aspect of the
trachea, little changed from previous exam; thyroid gland is
previously been assessed by ultrasound and FNA in 2322. No thoracic
adenopathy. Esophagus unremarkable.

Lungs/Pleura: Scattered peribronchial thickening. Lungs otherwise
clear. No pulmonary infiltrate, pleural effusion, or pneumothorax.

Upper Abdomen: Unremarkable

Musculoskeletal: Normal appearance

Review of the MIP images confirms the above findings.
IMPRESSION: No evidence of pulmonary embolism.

Scattered peribronchial thickening question bronchitis.

Chronic enlargement of RIGHT thyroid lobe, previously assessed by
ultrasound and ultrasound-guided FNA in 2322.

Aortic Atherosclerosis ([id number].O).

## 2023-08-02 IMAGING — CR DG CHEST 2V
2 series · 2 of 2 positions shown · non-contrast
Comparison: 04/29/2021

CLINICAL DATA: Chest pain

EXAM:
CHEST - 2 VIEW

[chest pa]
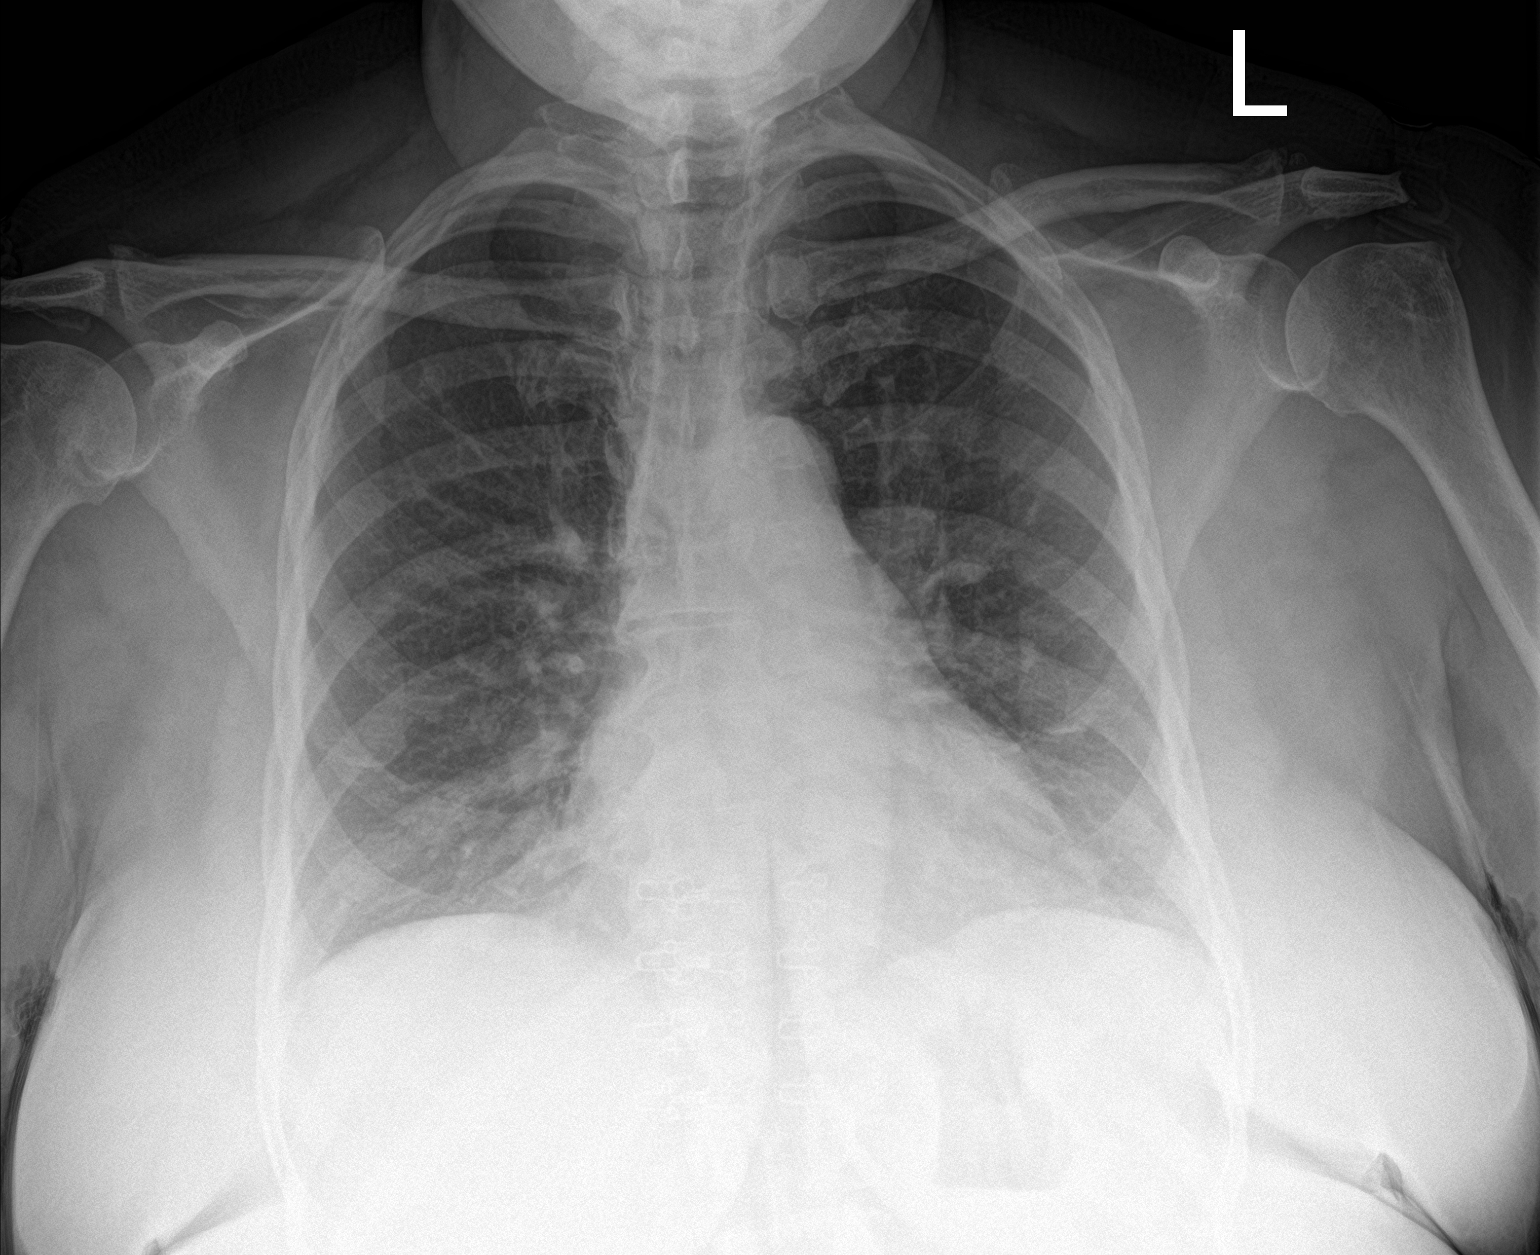

[chest lat]
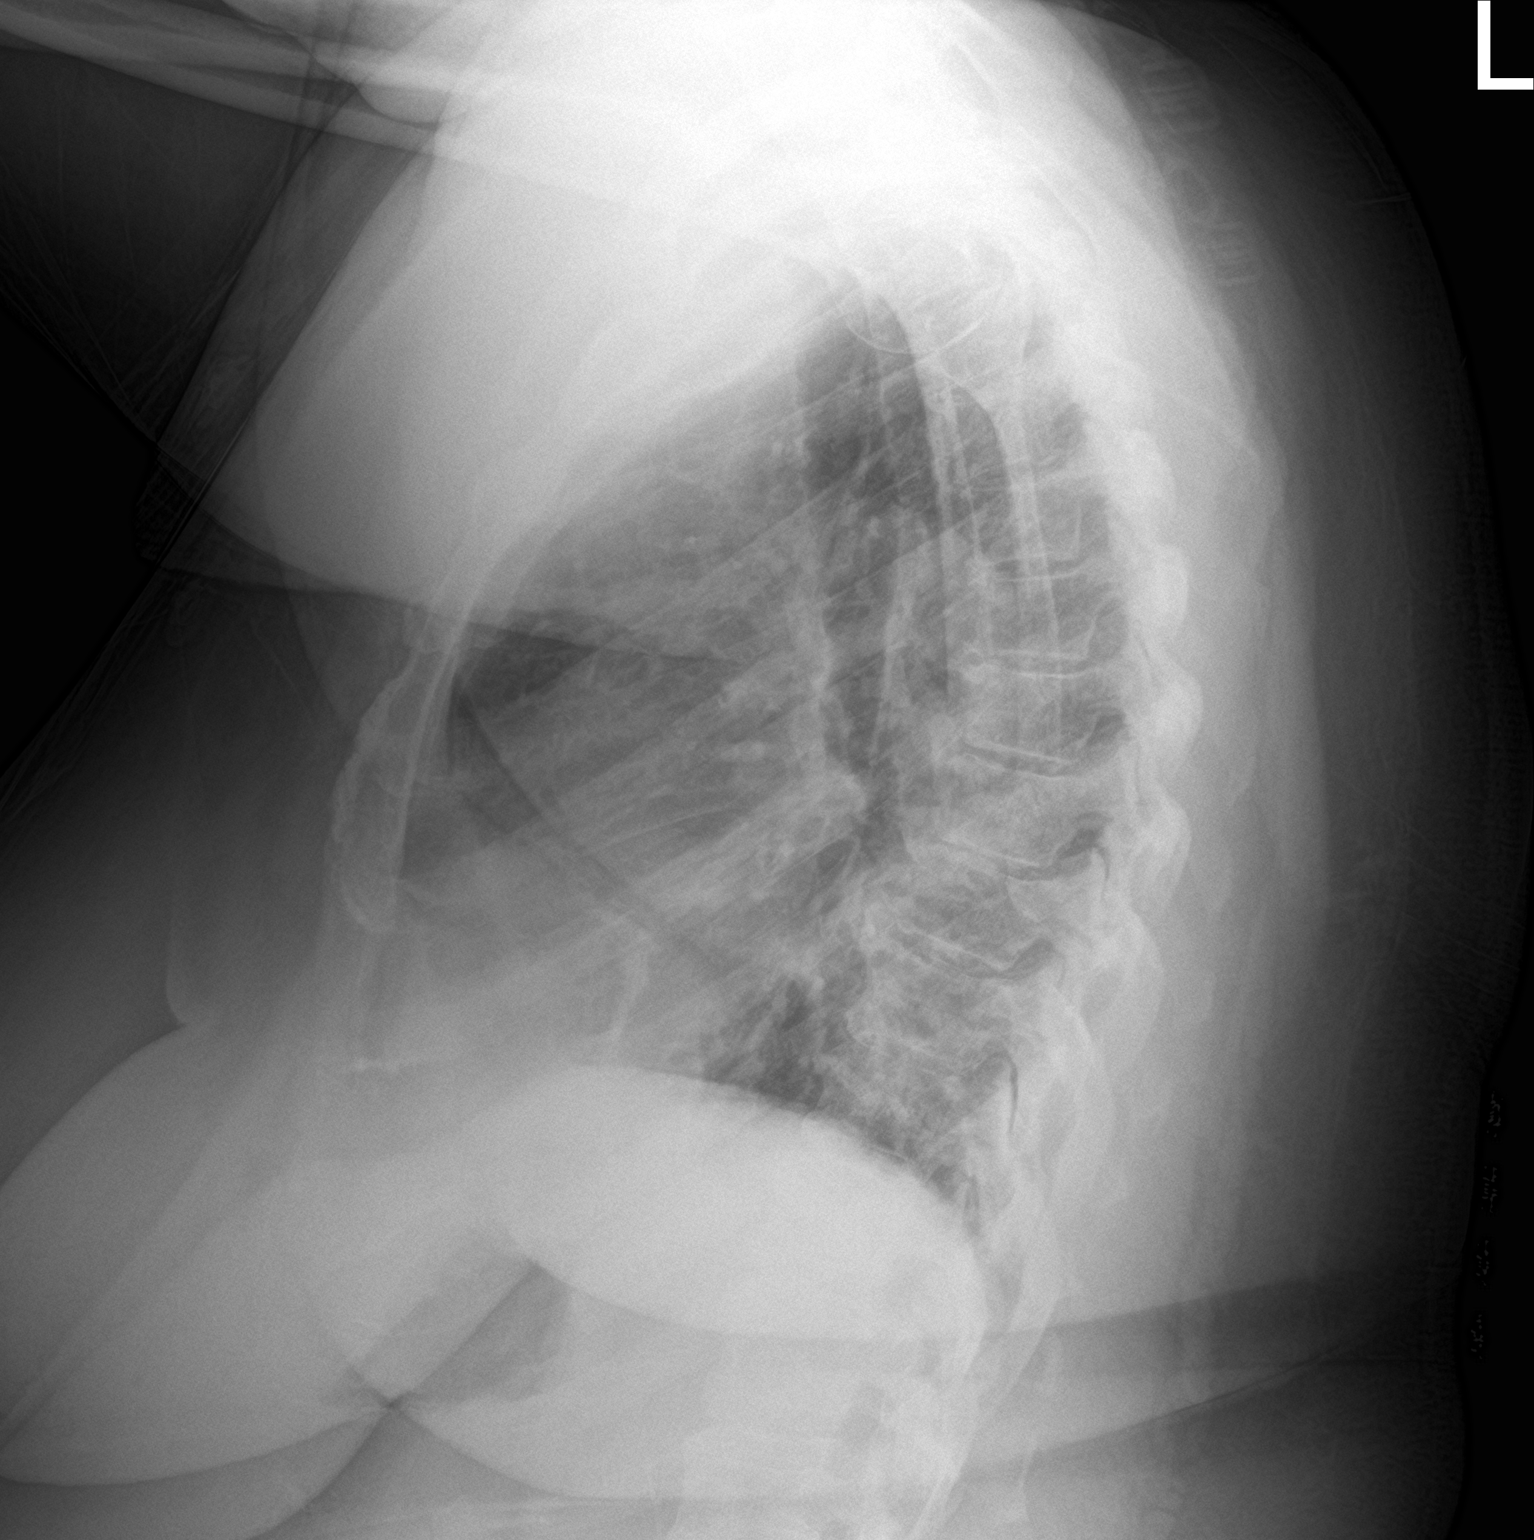

[2 of 2 positions shown; findings below may reference images not displayed]

FINDINGS: Cardiac shadow is stable. The lungs are well aerated bilaterally. No
focal infiltrate or sizable effusion is seen. No bony abnormality is
noted.
IMPRESSION: No acute abnormality seen.

## 2023-08-12 IMAGING — MG MM DIGITAL SCREENING BILAT W/ TOMO AND CAD
6 of 12 series · 6 of 36 positions shown · non-contrast
Comparison: Previous exam(s).

CLINICAL DATA: Screening.

EXAM:
DIGITAL SCREENING BILATERAL MAMMOGRAM WITH TOMOSYNTHESIS AND CAD
TECHNIQUE: Bilateral screening digital craniocaudal and mediolateral oblique
mammograms were obtained. Bilateral screening digital breast
tomosynthesis was performed. The images were evaluated with
computer-aided detection.

[L MLO synth-2D (1 of 2)]
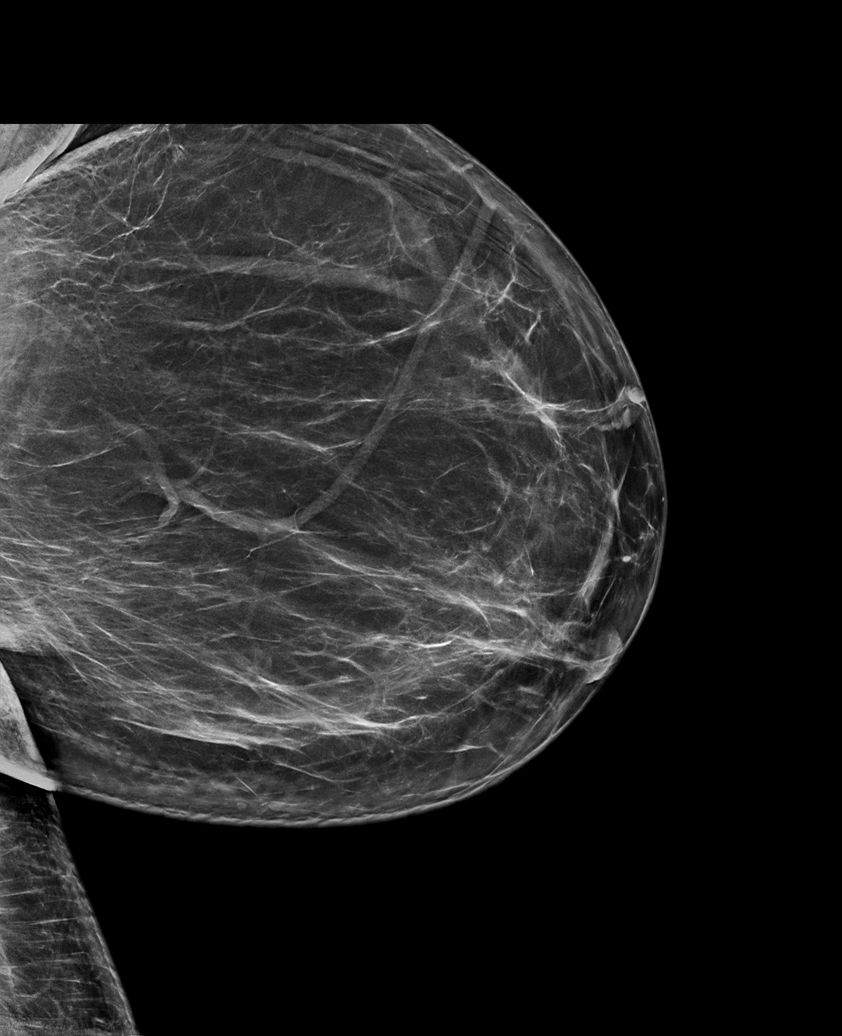

[R MLO synth-2D (1 of 2)]
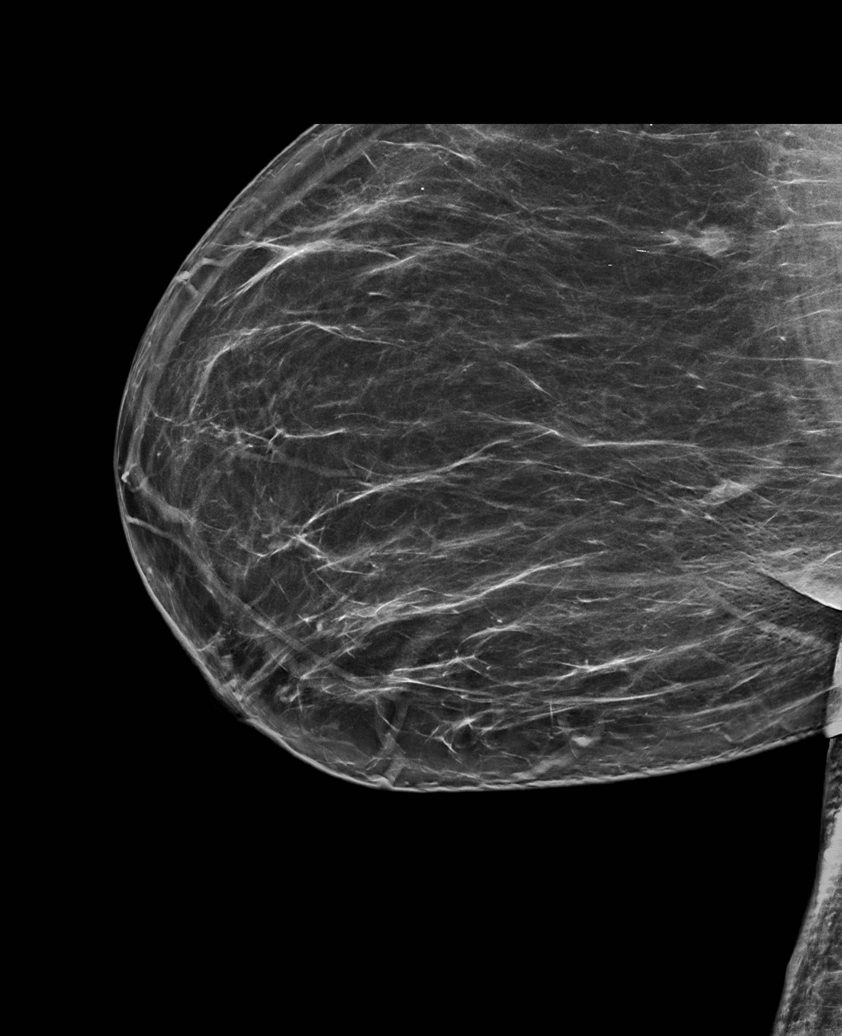

[L MLO synth-2D (2 of 2)]
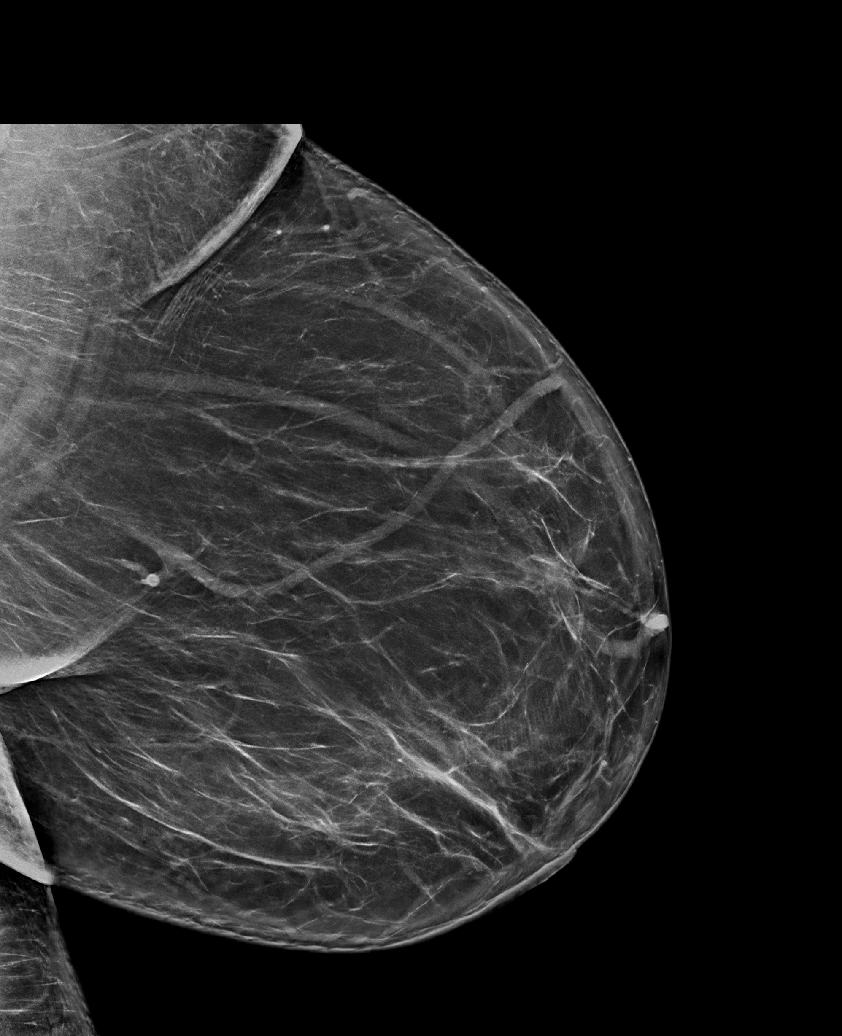

[L CC synth-2D]
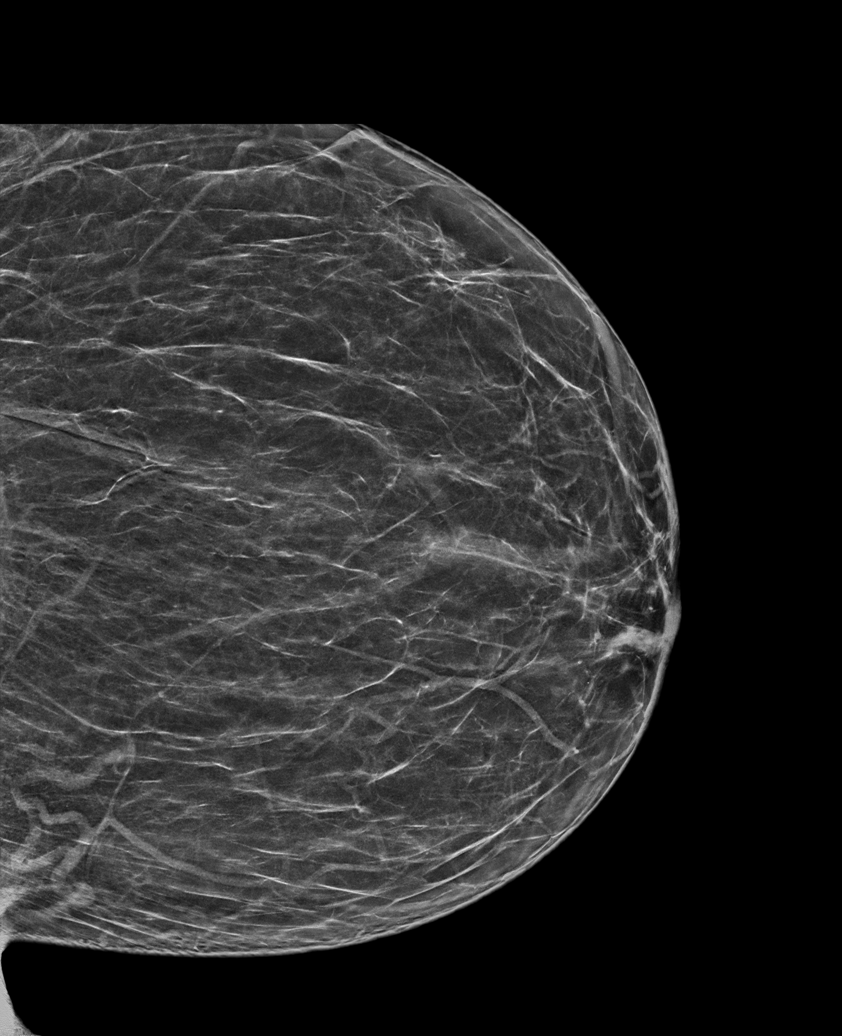

[R CC synth-2D]
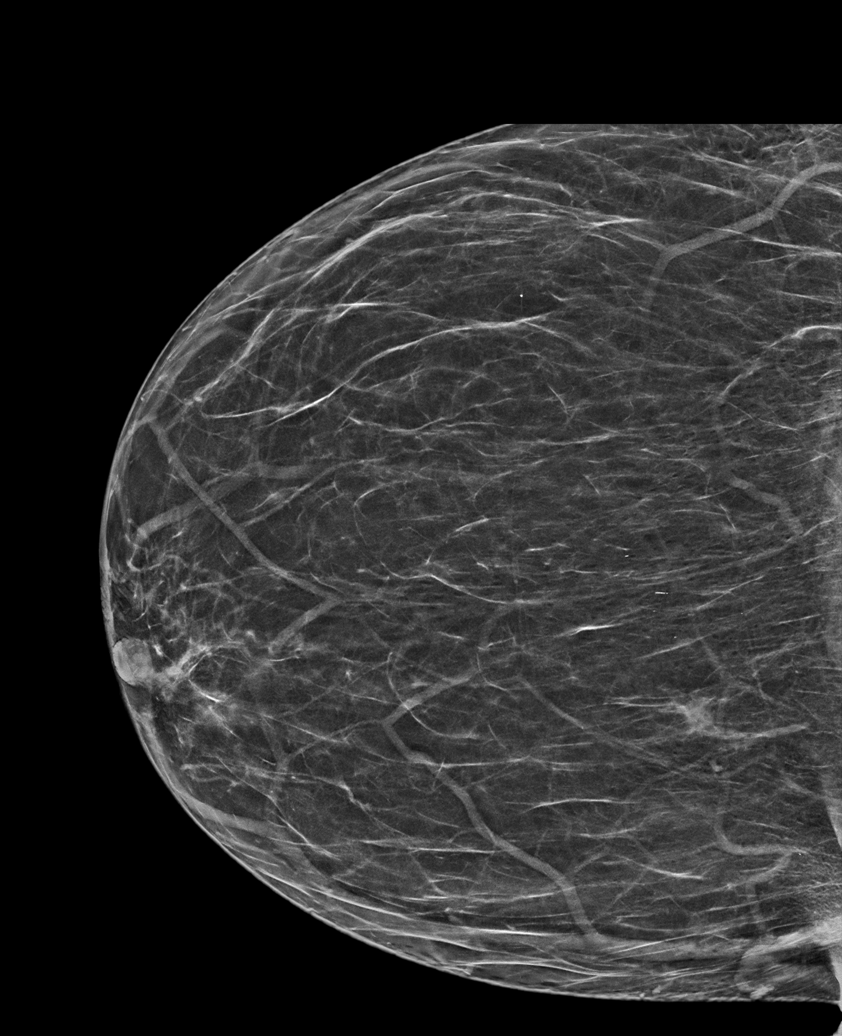

[R MLO synth-2D (2 of 2)]
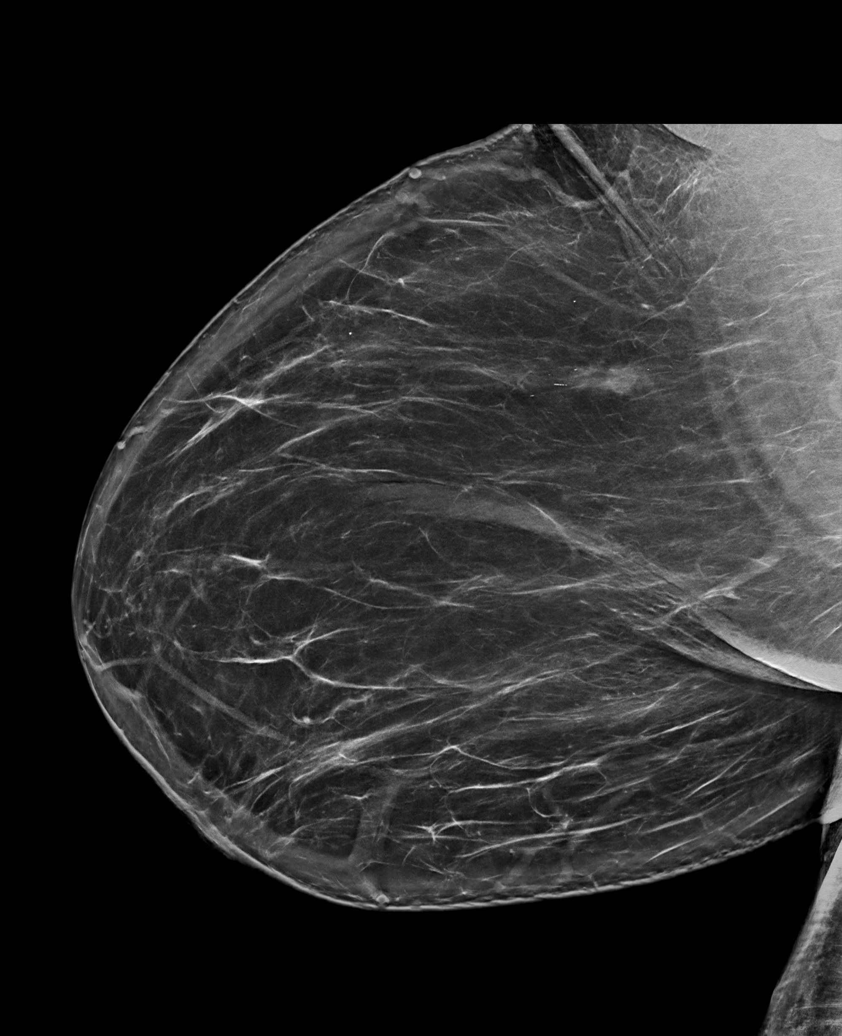

[6 of 36 positions shown; findings below may reference images not displayed]

ACR Breast Density Category b: There are scattered areas of
fibroglandular density.
FINDINGS: In the right breast, a possible mass warrants further evaluation. In
the left breast, no findings suspicious for malignancy.
IMPRESSION: Further evaluation is suggested for a possible mass in the right
breast.

RECOMMENDATION:
Diagnostic mammogram and possibly ultrasound of the right breast.
(Code:[id number]-[id number])

The patient will be contacted regarding the findings, and additional
imaging will be scheduled.

BI-RADS CATEGORY  0: Incomplete. Need additional imaging evaluation
and/or prior mammograms for comparison.

## 2023-08-14 ENCOUNTER — Encounter

## 2023-08-14 ENCOUNTER — Other Ambulatory Visit (INDEPENDENT_AMBULATORY_CARE_PROVIDER_SITE_OTHER): Payer: Self-pay | Admitting: Adult Health

## 2023-08-14 DIAGNOSIS — E1165 Type 2 diabetes mellitus with hyperglycemia: Secondary | ICD-10-CM

## 2023-08-24 ENCOUNTER — Ambulatory Visit
Admission: RE | Admit: 2023-08-24 | Discharge: 2023-08-24 | Disposition: A | Discharge status: Home / Self Care | Source: Ambulatory Visit | Attending: Hematology and Oncology

## 2023-08-24 DIAGNOSIS — Z171 Estrogen receptor negative status [ER-]: Secondary | ICD-10-CM

## 2023-08-29 ENCOUNTER — Encounter (INDEPENDENT_AMBULATORY_CARE_PROVIDER_SITE_OTHER): Payer: Self-pay | Admitting: Family Medicine

## 2023-08-29 ENCOUNTER — Ambulatory Visit (INDEPENDENT_AMBULATORY_CARE_PROVIDER_SITE_OTHER): Admitting: Physician Assistant

## 2023-08-29 ENCOUNTER — Ambulatory Visit (INDEPENDENT_AMBULATORY_CARE_PROVIDER_SITE_OTHER): Admitting: Family Medicine

## 2023-08-29 VITALS — BP 119/78 | HR 80 | Temp 98.2°F | Ht 59.0 in | Wt 199.0 lb

## 2023-08-29 DIAGNOSIS — F32A Depression, unspecified: Secondary | ICD-10-CM

## 2023-08-29 DIAGNOSIS — E1165 Type 2 diabetes mellitus with hyperglycemia: Secondary | ICD-10-CM

## 2023-08-29 DIAGNOSIS — F419 Anxiety disorder, unspecified: Secondary | ICD-10-CM | POA: Diagnosis not present

## 2023-08-29 DIAGNOSIS — E559 Vitamin D deficiency, unspecified: Secondary | ICD-10-CM

## 2023-08-29 DIAGNOSIS — Z7985 Long-term (current) use of injectable non-insulin antidiabetic drugs: Secondary | ICD-10-CM

## 2023-08-29 DIAGNOSIS — Z6841 Body Mass Index (BMI) 40.0 and over, adult: Secondary | ICD-10-CM

## 2023-08-29 MED ORDER — TIRZEPATIDE 5 MG/0.5ML ~~LOC~~ SOAJ: 5.0000 mg | SUBCUTANEOUS | 0 refills | Status: DC

## 2023-08-29 MED ORDER — SERTRALINE HCL 25 MG PO TABS: 25.0000 mg | ORAL_TABLET | Freq: Every day | ORAL | 0 refills | Status: DC

## 2023-08-29 MED ORDER — VITAMIN D (ERGOCALCIFEROL) 1.25 MG (50000 UNIT) PO CAPS: 50000.0000 [IU] | ORAL_CAPSULE | ORAL | 0 refills | Status: DC

## 2023-08-29 NOTE — Progress Notes (Signed)
 SUBJECTIVE:  Chief Complaint: Obesity  Interim History: Patient has been doing her follow up with [facility] and most recently [name].  She has been working on focusing on her nutrition and increasing her physical activity in the water.  She isn't always getting every breakfast in.  She has been not hungry and so it's been hard to get all the food in.  Over the next few weeks she is planning to go to [facility] in remembrance of her best friend.  She is planning to start dating soon.  [name] is here to discuss her progress with her obesity treatment plan. She is on the Category 2 Plan and states she is following her eating plan approximately 70 % of the time. She states she is exercising 45 minutes 2 times per week.   OBJECTIVE: Visit Diagnoses: Problem List Items Addressed This Visit       Endocrine   Type 2 diabetes mellitus with hyperglycemia, without long-term current use of insulin  (HCC) - Primary   Patient is working on staying more aligned with meal plan.  She is trying to limit her simple carbohydrate intake. She needs a refill of her Mounjaro today.  No significant GI side effects mentioned.      Relevant Medications   tirzepatide (MOUNJARO) 5 MG/0.5ML Pen     Other   Vitamin D  deficiency   On prescription strength Vitamin D .  Needs a refill of her Vitamin D  today.  No nausea, vomiting or muscle weakness mentioned.      Relevant Medications   Vitamin D , Ergocalciferol , (DRISDOL ) 1.25 MG (50000 UNIT) CAPS capsule   Anxiety and depression   Doing well on sertraline .  No suicidal or homicidal ideation.  Needs a refill of sertraline  today.  No change in dose as patient feeling improvement in symptoms on this dose.      Relevant Medications   sertraline  (ZOLOFT ) 25 MG tablet   Morbid obesity (HCC)   Anthropometric Measurements Height: 4\' 11"  (1.499 m) Weight: 199 lb (90.3 kg) BMI (Calculated): 40.17 Weight at Last Visit: 203 lb Weight Lost Since Last Visit: 4 Weight Gained  Since Last Visit: 0 Starting Weight: 209 lb Total Weight Loss (lbs): 10 lb (4.536 kg) Peak Weight: 245 lb Body Composition  Body Fat %: 47.2 % Fat Mass (lbs): 94.4 lbs Muscle Mass (lbs): 100 lbs Total Body Water (lbs): 72.4 lbs Visceral Fat Rating : 16 Other Clinical Data Fasting: yes Labs: no Today's Visit #: 25 Starting Date: 06/01/21 Comments: Cat 2       Relevant Medications   tirzepatide (MOUNJARO) 5 MG/0.5ML Pen   Other Visit Diagnoses       BMI 40.0-44.9, adult (HCC) Current BMI 41.2       Relevant Medications   tirzepatide [facility]) 5 MG/0.5ML Pen       No data recorded       08/29/2023    9:00 AM 07/26/2023    2:00 PM 07/25/2023    8:27 PM  Vitals with BMI  Height 4\' 11"  4\' 11"    Weight 199 lbs 203 lbs   BMI 40.17 40.98   Systolic 119 115 161  Diastolic 78 73 92  Pulse 80 71 62      ASSESSMENT AND PLAN:  Diet: [name] is currently in the action stage of change. As such, her goal is to continue with weight loss efforts and has agreed to the Category 2 Plan.   Exercise:  For substantial health benefits, adults should do at  least 150 minutes (2 hours and 30 minutes) a week of moderate-intensity, or 75 minutes (1 hour and 15 minutes) a week of vigorous-intensity aerobic physical activity, or an equivalent combination of moderate- and vigorous-intensity aerobic activity. Aerobic activity should be performed in episodes of at least 10 minutes, and preferably, it should be spread throughout the week.  Behavior Modification:  We discussed the following Behavioral Modification Strategies today: increasing lean protein intake, decreasing simple carbohydrates, increasing vegetables, meal planning and cooking strategies, keeping healthy foods in the home, and planning for success.   Return in about 4 weeks (around 09/26/2023) for fasting labs and IC.   She was informed of the importance of frequent follow up visits to maximize her success with intensive lifestyle  modifications for her multiple health conditions.  Attestation Statements:   Reviewed by clinician on day of visit: allergies, medications, problem list, medical history, surgical history, family history, social history, and previous encounter notes.     Donaciano Frizzle, MD

## 2023-08-29 NOTE — Progress Notes (Deleted)
   SUBJECTIVE: Discussed the use of AI scribe software for clinical note transcription with the patient, who gave verbal consent to proceed.  Chief Complaint: Obesity  Interim History: ***  [name] is here to discuss her progress with her obesity treatment plan. She is on the {HWW Weight Loss Plan:210964005} and states she {CHL AMB IS/IS NOT:210130109} following her eating plan approximately *** % of the time. She states she {CHL AMB IS/IS NOT:210130109} exercising *** minutes *** times per week.   OBJECTIVE: Visit Diagnoses: Problem List Items Addressed This Visit     Type 2 diabetes mellitus with hyperglycemia, without long-term current use of insulin  (HCC) - Primary   Vitamin D  deficiency   Other Visit Diagnoses       Hypertension associated with type 2 diabetes mellitus (HCC)         Injection site reaction, subsequent encounter         Obesity with starting BMI of 42.4           No data recorded No data recorded No data recorded No data recorded   ASSESSMENT AND PLAN:  Diet: [name] {CHL AMB IS/IS NOT:210130109} currently in the action stage of change. As such, her goal is to {HWW Weight Loss Efforts:210964006}. She {HAS HAS ZOX:09604} agreed to {HWW Weight Loss Plan:210964005}.  Exercise: [name] has been instructed {HWW Exercise:210964007} for weight loss and overall health benefits.   Behavior Modification:  We discussed the following Behavioral Modification Strategies today: {HWW Behavior Modification:210964008}. We discussed various medication options to help [name] with her weight loss efforts and we both agreed to ***.  No follow-ups on file.Aaron Aas She was informed of the importance of frequent follow up visits to maximize her success with intensive lifestyle modifications for her multiple health conditions.  Attestation Statements:   Reviewed by clinician on day of visit: allergies, medications, problem list, medical history, surgical history, family history,  social history, and previous encounter notes.   Time spent on visit including pre-visit chart review and post-visit care and charting was *** minutes.    Jerrell Hart, PA-C

## 2023-08-29 NOTE — Assessment & Plan Note (Signed)
 Doing well on sertraline .  No suicidal or homicidal ideation.  Needs a refill of sertraline  today.  No change in dose as patient feeling improvement in symptoms on this dose.

## 2023-09-09 NOTE — Assessment & Plan Note (Signed)
 On prescription strength Vitamin D .  Needs a refill of her Vitamin D  today.  No nausea, vomiting or muscle weakness mentioned.

## 2023-09-09 NOTE — Assessment & Plan Note (Signed)
 Anthropometric Measurements Height: 4\' 11"  (1.499 m) Weight: 199 lb (90.3 kg) BMI (Calculated): 40.17 Weight at Last Visit: 203 lb Weight Lost Since Last Visit: 4 Weight Gained Since Last Visit: 0 Starting Weight: 209 lb Total Weight Loss (lbs): 10 lb (4.536 kg) Peak Weight: 245 lb Body Composition  Body Fat %: 47.2 % Fat Mass (lbs): 94.4 lbs Muscle Mass (lbs): 100 lbs Total Body Water (lbs): 72.4 lbs Visceral Fat Rating : 16 Other Clinical Data Fasting: yes Labs: no Today's Visit #: 25 Starting Date: 06/01/21 Comments: Cat 2

## 2023-09-09 NOTE — Assessment & Plan Note (Signed)
 Patient is working on staying more aligned with meal plan.  She is trying to limit her simple carbohydrate intake. She needs a refill of her Mounjaro today.  No significant GI side effects mentioned.

## 2023-09-13 ENCOUNTER — Ambulatory Visit (INDEPENDENT_AMBULATORY_CARE_PROVIDER_SITE_OTHER)

## 2023-09-13 ENCOUNTER — Ambulatory Visit (INDEPENDENT_AMBULATORY_CARE_PROVIDER_SITE_OTHER): Admitting: Podiatry

## 2023-09-13 ENCOUNTER — Encounter: Payer: Self-pay | Admitting: Podiatry

## 2023-09-13 VITALS — Ht 59.0 in | Wt 199.0 lb

## 2023-09-13 DIAGNOSIS — M76821 Posterior tibial tendinitis, right leg: Secondary | ICD-10-CM

## 2023-09-13 DIAGNOSIS — M722 Plantar fascial fibromatosis: Secondary | ICD-10-CM | POA: Diagnosis not present

## 2023-09-13 DIAGNOSIS — M76822 Posterior tibial tendinitis, left leg: Secondary | ICD-10-CM

## 2023-09-13 MED ORDER — TRIAMCINOLONE ACETONIDE 10 MG/ML IJ SUSP
10.0000 mg | Freq: Once | INTRAMUSCULAR | Status: AC
  Administered 2023-09-13: 10 mg via INTRA_ARTICULAR

## 2023-09-17 NOTE — Progress Notes (Signed)
 Subjective:   Patient ID: [name], female   DOB: 63 y.o.   MRN: [id number]   HPI Patient states she is having pain in both of her ankles but states the left is worse and feels like the foot is flat.  Does not remember injury states it has been present for several months   ROS      Objective:  Physical Exam  Neurovascular status intact patient is found to have depression of the arch bilateral with inflammation fluid around the ankle and posterior tibial tendon left over right with pain present     Assessment:  Inflammatory posterior tibial tendinitis  Left over right with no indication of complete dysfunction but some flattening of the arch     Plan:  H&P x-ray reviewed today I did explain injection treatment and if symptoms do not improve may require MRI or orthotics and I went ahead and carefully injected the sheath the posterior tibial tendon bilateral 3 mg dexamethasone  Kenalog  5 mg Xylocaine  I then applied fascial brace left to lift up the arch wearing good support shoes reappoint to recheck  X-rays indicate there is depression of the arch left over right no other indication pathology

## 2023-10-01 ENCOUNTER — Ambulatory Visit: Admitting: Internal Medicine

## 2023-10-01 ENCOUNTER — Encounter: Payer: Self-pay | Admitting: Internal Medicine

## 2023-10-01 VITALS — BP 118/79 | HR 79 | Ht <= 58 in | Wt 202.2 lb

## 2023-10-01 DIAGNOSIS — Z9989 Dependence on other enabling machines and devices: Secondary | ICD-10-CM

## 2023-10-01 DIAGNOSIS — R0602 Shortness of breath: Secondary | ICD-10-CM | POA: Diagnosis not present

## 2023-10-01 DIAGNOSIS — G4733 Obstructive sleep apnea (adult) (pediatric): Secondary | ICD-10-CM | POA: Diagnosis not present

## 2023-10-01 DIAGNOSIS — Z87891 Personal history of nicotine dependence: Secondary | ICD-10-CM | POA: Diagnosis not present

## 2023-10-01 MED ORDER — FLUTICASONE FUROATE-VILANTEROL 100-25 MCG/ACT IN AEPB: 1.0000 | INHALATION_SPRAY | Freq: Every day | RESPIRATORY_TRACT | 5 refills | Status: DC

## 2023-10-01 NOTE — Progress Notes (Signed)
 [name]    [id number]    October 26, 1960  Primary Care Physician:Smith, Leata Providence., FNP  Referring Physician: Flonnie Humphrey, DO [address] [place],  [place] [address] Reason for Consultation: shortness of breath Date of Consultation: 10/01/2023  Chief complaint:   Chief Complaint  Patient presents with   Consult    Sob on exertion, comes and goes.(Cyst on lungs 2005). Started a month ago.     HPI: Discussed the use of AI scribe software for clinical note transcription with the patient, who gave verbal consent to proceed.  History of Present Illness LAGENA Barrera is a 63 year old female with a history of a lung cyst who presents with shortness of breath.   She has a history of a lung cyst diagnosed in 2008, but has not had regular checkups for this condition in recent years. She experiences shortness of breath, chest and back pain, and episodes of hemoptysis at night with coughing(scant intermittent blood tinged sputum), particularly at night, leading to multiple emergency care visits.  Her shortness of breath occurs during physical activities such as walking and water exercises, which she has been doing as part of a healthy weight and wellness program. She becomes short of breath even in water, causing anxiety and leading her to stop exercising. She also experiences shortness of breath when talking and sometimes requires a cart when shopping due to fatigue.  Her past treatments include the use of albuterol  and Qvar inhalers. She currently uses albuterol  as needed, which provides some relief, but has not used it in over a month. She has a history of bronchitis, requiring treatment with prednisone  and breathing treatments a few times a year, though prednisone  is not frequently prescribed for her lung issues.  She has a smoking history, having started at age 62 and quitting in her 30s, approximately 38 years ago, with a peak of half a pack a day. She has a  family history of asthma, as her daughter has asthma.  She has been diagnosed with sleep apnea and uses a CPAP machine, which she feels benefits her by providing energy. She lives alone and is able to perform daily activities independently, although she sometimes uses a cart for shopping due to fatigue.    Social history:  Occupation: disabled for chronic back pain, works as a Lawyer part time.  Exposures: lives at home independently, ok with ADLs.  Smoking history: former smoker  Social History   Occupational History   Occupation: UNEMPLOYED    Employer: DISABLED  Tobacco Use   Smoking status: Former    Current packs/day: 0.00    Average packs/day: 0.5 packs/day for 15.0 years (7.5 ttl pk-yrs)    Types: Cigarettes    Start date: 04/24/1970    Quit date: 04/24/1985    Years since quitting: 38.4   Smokeless tobacco: Never  Vaping Use   Vaping status: Never Used  Substance and Sexual Activity   Alcohol use: No    Alcohol/week: 0.0 standard drinks of alcohol   Drug use: Not Currently    Types: Marijuana   Sexual activity: Not Currently    Birth control/protection: Surgical    Comment: hyst    Relevant family history:  Family History  Problem Relation Age of Onset   Emphysema Mother    Arthritis Mother    Heart failure Mother        alive @ 67   Stroke Mother  Diabetes Mother    Hypertension Mother    Hyperlipidemia Mother    Depression Mother    Anxiety disorder Mother    Heart disease Mother    Alcoholism Mother    Depression Father    Heart disease Father        died @ 69's.   Stroke Father    Diabetes Father    Hyperlipidemia Father    Hypertension Father    Bipolar disorder Father    Sleep apnea Father    Alcoholism Father    Drug abuse Father    Sudden death Father    Diabetes Sister    Asthma Brother    Colon cancer Maternal Grandfather        dx after 50   Heart disease Paternal Grandfather    Breast cancer Maternal Aunt 53 - 69   Breast cancer  Cousin        dx 10s   Asthma Daughter    Esophageal cancer Neg Hx    Stomach cancer Neg Hx    Rectal cancer Neg Hx     Past Medical History:  Diagnosis Date   Allergy    Anginal pain (HCC)    a. NL cath in 2008;  b. Myoview  03/2011: dec uptake along mid anterior wall on stress imaging -> ? attenuation vs. ischemia, EF 65%;  c. 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 2008 -> coumadin x 6 mos.   Rheumatoid arthritis (HCC)    Sleep apnea    on CPAP 02/2018   SOB (shortness of breath)    Thyroid  disease    TIA (transient ischemic attack)    Urinary urgency    Vitamin D  deficiency     Past Surgical History:  Procedure Laterality Date   ABDOMINAL HYSTERECTOMY  2005   APPENDECTOMY     BREAST LUMPECTOMY WITH RADIOACTIVE SEED AND SENTINEL LYMPH NODE BIOPSY Right 11/16/2021   Procedure: RIGHT BREAST RADIOACTIVE SEED LOCALIZED LUMPECTOMY WITH RIGHT AXILLARY SENTINEL LYMPH NODE BIOPSY;  Surgeon: Dareen Ebbing, MD;  Location: [facility];  Service: General;  Laterality: Right;  LMA & PEC BLOCK   CARDIAC CATHETERIZATION     Normal   CARDIAC CATHETERIZATION N/A 09/30/2014   Procedure: Left Heart Cath and Coronary Angiography;  Surgeon: Arnoldo Lapping, MD;  Location: [facility] INVASIVE CV LAB;   Service: Cardiovascular;  Laterality: N/A;   LAPAROSCOPIC APPENDECTOMY N/A 06/03/2012   Procedure: APPENDECTOMY LAPAROSCOPIC;  Surgeon: Lockie Rima, MD;  Location: [facility];  Service: General;  Laterality: N/A;   Left knee surgery  2008   LEG SURGERY     RIGHT/LEFT HEART CATH AND CORONARY ANGIOGRAPHY N/A 01/10/2023   Procedure: RIGHT/LEFT HEART CATH AND CORONARY ANGIOGRAPHY;  Surgeon: Kyra Phy, MD;  Location: [facility] INVASIVE CV LAB;  Service: Cardiovascular;  Laterality: N/A;   TONSILLECTOMY     TOTAL HIP ARTHROPLASTY Left 06/11/2018   Procedure: LEFT TOTAL HIP ARTHROPLASTY ANTERIOR APPROACH;  Surgeon: Jasmine Mesi, MD;  Location: [facility] OR;  Service: Orthopedics;  Laterality: Left;   TOTAL KNEE ARTHROPLASTY Left 08/22/2016   Procedure: TOTAL KNEE ARTHROPLASTY;  Surgeon: Jasmine Mesi, MD;  Location: [facility];  Service: Orthopedics;  Laterality: Left;   TUBAL LIGATION  1989     Physical Exam: Blood pressure 118/79, pulse 79, height 4\' 10"  (1.473 m), weight 202 lb 3.2 oz (91.7 kg), SpO2 97%. Gen:      No acute distress, obese ENT:  no nasal polyps, mucus membranes moist Lungs:    No increased respiratory effort, symmetric chest wall excursion, clear to auscultation bilaterally, no wheezes or crackles CV:         Regular rate and rhythm; no murmurs, rubs, or gallops.  No pedal edema Abd:      + bowel sounds; soft, non-tender; no distension MSK: no acute synovitis of DIP or PIP joints, no mechanics hands.  Skin:      Warm and dry; no rashes Neuro: normal speech, no focal facial asymmetry Psych: alert and oriented x3, normal mood and affect   Data Reviewed/Medical Decision Making:  Independent interpretation of tests: Imaging:  Review of patient's CTPE April 2025 images revealed no acute cardiopulmonary process, negative PE study. The patient's images have been independently reviewed by me.    PFTs: I have personally reviewed the patient's PFTs and normal pulmonary function in  2015  Labs:  Lab Results  Component Value Date   NA 140 07/25/2023   K 4.3 07/25/2023   CO2 25 07/25/2023   GLUCOSE 111 (H) 07/25/2023   BUN 15 07/25/2023   CREATININE 1.03 (H) 07/25/2023   CALCIUM  9.4 07/25/2023   EGFR 70 06/05/2023   GFRNONAA >60 07/25/2023   Lab Results  Component Value Date   WBC 4.7 07/25/2023   HGB 14.0 07/25/2023   HCT 43.8 07/25/2023   MCV 91.1 07/25/2023   PLT 210 07/25/2023     Immunization status:  Immunization History  Administered Date(s) Administered   Influenza Split 01/09/2012   PPD Test 08/31/2016, 09/14/2016   Pneumococcal Polysaccharide-23 01/09/2012   Previous PFTS reviewed from [facility]: Pulmonary Function Testing (Personally Reviewed):  [facility] 08/02/2012: FEV1: 1.28 (61 % predicted), FVC: 1.55 (55 % predicted), FEV1/FVC: 0.82, TLC: 2.51 (60 % predicted), RV/TLVC: 0.38, DLCO: 12.8 (51 % predicted)  Methacholine challenge 05/23/13: PC20 0.34     I reviewed prior external note(s) from pulmonary  I reviewed the result(s) of the labs and imaging as noted above.   I have ordered pft   Assessment and Plan Assessment & Plan Shortness of breath - possible Asthma Chronic dyspnea with episodes of chest and back pain. Previous CT scan showed no pulmonary embolism or lung abnormalities. Pulmonary function testing from 2014 was normal. Differential diagnosis includes asthma with positive methacholine challenge test in 2015. Albuterol  provides inconsistent relief.  - Order pulmonary function test to assess current lung function. - Prescribe Breo inhaler, one puff once daily, instruct to  gargle after use. - Instruct to continue using albuterol  inhaler as needed and monitor its effectiveness.  Sleep apnea Diagnosed with sleep apnea and uses CPAP, which provides symptomatic relief and improved energy levels. Managed by another provider.     Return to Care: Return in about 6 weeks (around 11/12/2023).  Louie Rover,  MD Pulmonary and Critical Care Medicine [facility] Office:[phone]  CC: Flonnie Humphrey, DO

## 2023-10-01 NOTE — Patient Instructions (Signed)
 It was a pleasure to see you today!  Please schedule follow up with myself in 6 weeks.  If my schedule is not open yet, we will contact you with a reminder closer to that time. Please call [phone] if you haven't heard from us  a month before, and always call us  sooner if issues or concerns arise. You can also send us  a message through MyChart, but but aware that this is not to be used for urgent issues and it may take up to 5-7 days to receive a reply. Please be aware that you will likely be able to view your results before I have a chance to respond to them. Please give us  5 business days to respond to any non-urgent results.    YOUR PLAN:  -SHORTNESS OF BREATH: Your shortness of breath may be related to your lung condition. We will conduct a pulmonary function test to assess your current lung function. You are prescribed a Breo inhaler, which you should use once daily with one puff and remember to gargle after use. Continue using your albuterol  inhaler as needed and monitor how well it works for you.  -SLEEP APNEA: Sleep apnea is a condition where your breathing stops and starts during sleep. You are already using a CPAP machine, which helps you feel more energetic. This condition is managed by another provider, so continue with your current treatment.  -SMOKING CESSATION: You have a history of smoking but quit over 30 years ago. It is great that you have maintained this healthy habit of not smoking or vaping.  INSTRUCTIONS:  Please schedule a pulmonary function test to assess your current lung function. Continue using your Breo inhaler daily and albuterol  inhaler as needed. Monitor the effectiveness of your inhalers and report any changes in your symptoms.

## 2023-10-02 ENCOUNTER — Telehealth: Payer: 59 | Admitting: Adult Health

## 2023-10-02 ENCOUNTER — Ambulatory Visit: Admitting: Adult Health

## 2023-10-02 ENCOUNTER — Encounter: Payer: Self-pay | Admitting: Adult Health

## 2023-10-02 NOTE — Progress Notes (Deleted)
 PATIENT: [name] DOB: 03-31-1961  REASON FOR VISIT: follow up HISTORY FROM: patient PRIMARY NEUROLOGIST: Dr.  Jenney Modest chief complaint on file.    HISTORY OF PRESENT ILLNESS: Today 10/02/23:  [name] is a 63 y.o. female with a history of OSA on CPAP. Returns today for follow-up.      09/25/22: [name] is a 63 y.o. female with a history of OSA on CPAP. Returns today for follow-up.  She reports that the CPAP is working well for her.  Her download is below      03/20/22: [name] is a 63 year old female with a history of obstructive sleep apnea on CPAP.  She returns today for follow-up.  She reports that the CPAP is working well for her.  She does state that 3 weeks ago started having nose bleeds every morning. PCP told her it was her CPAP but she didn't use the CPAP for two nights but still woke up with blood in her nose.  She states that she never has blood running from her nose but does notice blood when she blows her nose in the mornings.      REVIEW OF SYSTEMS: Out of a complete 14 system review of symptoms, the patient complains only of the following symptoms, and all other reviewed systems are negative.  ESS 3   ALLERGIES: Allergies  Allergen Reactions   Shellfish Allergy Hives and Anaphylaxis   Hydrocodone -Acetaminophen  Itching and Other (See Comments)    confusion Mental Status Changes   Penicillins Swelling and Other (See Comments)   Gabapentin      Not available   Latex Rash   Sulfa Antibiotics Diarrhea, Itching and Rash    HOME MEDICATIONS: Outpatient Medications Prior to Visit  Medication Sig Dispense Refill   ACCU-CHEK GUIDE test strip      acetaminophen  (TYLENOL ) 500 MG tablet Take 1,000 mg by mouth 2 (two) times daily as needed for moderate pain.     albuterol  (PROVENTIL  HFA;VENTOLIN  HFA) 108 (90 Base) MCG/ACT inhaler Inhale 2 puffs into the lungs every 6 (six) hours as needed for wheezing or shortness of  breath.     albuterol  (VENTOLIN  HFA) 108 (90 Base) MCG/ACT inhaler Inhale 1-2 puffs into the lungs every 6 (six) hours as needed for wheezing or shortness of breath. 1 each 0   aspirin  81 MG chewable tablet Chew 81 mg by mouth daily.     atorvastatin  (LIPITOR) 40 MG tablet Take 40 mg by mouth at bedtime.      BIOTIN PO Take 1 tablet by mouth at bedtime.     busPIRone  (BUSPAR ) 30 MG tablet Take 30 mg by mouth 2 (two) times daily.     carvedilol  (COREG ) 3.125 MG tablet Take 1 tablet (3.125 mg total) by mouth 2 (two) times daily. 180 tablet 3   clindamycin  (CLEOCIN ) 150 MG capsule TAKE 4 CAPSULES 1 HOUR PRIOR TO PROCEDURE     cyclobenzaprine  (FLEXERIL ) 10 MG tablet Take 1 tablet (10 mg total) by mouth 2 (two) times daily as needed for muscle spasms. 20 tablet 0   diclofenac  Sodium (VOLTAREN ) 1 % GEL Apply 2 g topically 4 (four) times daily as needed (joint/muscle pain).     empagliflozin  (JARDIANCE ) 10 MG TABS tablet Take 1 tablet (10 mg total) by mouth daily. 30 tablet 0   EPINEPHrine  0.3 mg/0.3 mL IJ SOAJ injection Inject 0.3 mg into the muscle as needed for anaphylaxis. Follow package instructions as needed for severe allergy or anaphylactic  reaction. 2 each 3   fluticasone  furoate-vilanterol (BREO ELLIPTA) 100-25 MCG/ACT AEPB Inhale 1 puff into the lungs daily. 1 each 5   furosemide  (LASIX ) 40 MG tablet Take 1 tablet (40 mg total) by mouth daily as needed for edema or fluid (take in case of weight gain 2 to 3 lbs in 24 hrs or 5 lbs in 7 days.). (Patient taking differently: Take 20 mg by mouth daily.) 30 tablet 0   hydrocortisone 2.5 % cream SMARTSIG:sparingly Topical Twice Daily PRN     hydrOXYzine  (VISTARIL ) 25 MG capsule Take 25 mg by mouth 3 (three) times daily as needed for itching.     ipratropium-albuterol  (DUONEB) 0.5-2.5 (3) MG/3ML SOLN Take 3 mLs by nebulization every 6 (six) hours as needed (shortness of breath).     KLOR-CON  M10 10 MEQ tablet Take 10 mEq by mouth 2 (two) times daily.      loperamide  (IMODIUM ) 2 MG capsule Take 1 capsule (2 mg total) by mouth 4 (four) times daily as needed for diarrhea or loose stools. 12 capsule 0   losartan  (COZAAR ) 25 MG tablet Take 1 tablet (25 mg total) by mouth daily. 90 tablet 1   meloxicam  (MOBIC ) 7.5 MG tablet      metFORMIN  (GLUCOPHAGE ) 500 MG tablet 1/2 tab daily with breakfast 30 tablet 0   methylPREDNISolone  (MEDROL  DOSEPAK) 4 MG TBPK tablet Take as directed 1 each 0   nitroGLYCERIN  (NITROSTAT ) 0.3 MG SL tablet Place 0.3 mg under the tongue every 5 (five) minutes as needed for chest pain.     nystatin (MYCOSTATIN/NYSTOP) powder Apply 1 application topically 3 (three) times daily as needed for rash.     ondansetron  (ZOFRAN -ODT) 4 MG disintegrating tablet Take 1 tablet (4 mg total) by mouth every 8 (eight) hours as needed for nausea or vomiting. 20 tablet 0   oxyCODONE  (OXY IR/ROXICODONE ) 5 MG immediate release tablet Take 1 tablet by mouth every 6 (six) hours as needed.     sertraline  (ZOLOFT ) 25 MG tablet Take 1 tablet (25 mg total) by mouth daily. 90 tablet 0   tirzepatide (MOUNJARO) 5 MG/0.5ML Pen Inject 5 mg into the skin once a week. 2 mL 0   triamcinolone  ointment (KENALOG ) 0.1 % APPLY A THIN LAYER TO THE AFFECTED AREA(S) ON LEFT ARM BY TOPICAL ROUTE 2 TIMES PER DAY FOR 7 DAYS     Vitamin D , Ergocalciferol , (DRISDOL ) 1.25 MG (50000 UNIT) CAPS capsule Take 1 capsule (50,000 Units total) by mouth every 7 (seven) days. 4 capsule 0   No facility-administered medications prior to visit.    PAST MEDICAL HISTORY: Past Medical History:  Diagnosis Date   Allergy    Anginal pain (HCC)    a. NL cath in 2008;  b. Myoview  03/2011: dec uptake along mid anterior wall on stress imaging -> ? attenuation vs. ischemia, EF 65%;  c. 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 2008 -> coumadin x 6 mos.   Rheumatoid arthritis (HCC)    Sleep apnea    on CPAP 02/2018   SOB (shortness of breath)    Thyroid  disease    TIA (transient ischemic attack)    Urinary urgency    Vitamin D  deficiency     PAST SURGICAL HISTORY: Past Surgical History:  Procedure Laterality Date   ABDOMINAL HYSTERECTOMY  2005   APPENDECTOMY     BREAST LUMPECTOMY WITH RADIOACTIVE SEED AND SENTINEL LYMPH NODE BIOPSY Right 11/16/2021   Procedure: RIGHT BREAST RADIOACTIVE SEED LOCALIZED LUMPECTOMY WITH RIGHT AXILLARY SENTINEL LYMPH NODE BIOPSY;  Surgeon: Dareen Ebbing, MD;  Location: [facility];  Service: General;  Laterality: Right;  LMA & PEC BLOCK   CARDIAC CATHETERIZATION     Normal   CARDIAC CATHETERIZATION N/A 09/30/2014   Procedure: Left Heart Cath and Coronary Angiography;  Surgeon: Arnoldo Lapping, MD;  Location: [facility] INVASIVE CV LAB;  Service: Cardiovascular;  Laterality: N/A;   LAPAROSCOPIC APPENDECTOMY N/A 06/03/2012   Procedure: APPENDECTOMY LAPAROSCOPIC;  Surgeon: Lockie Rima, MD;  Location: [facility] OR;  Service: General;  Laterality: N/A;   Left knee surgery  2008   LEG SURGERY     RIGHT/LEFT HEART CATH AND CORONARY ANGIOGRAPHY N/A 01/10/2023   Procedure: RIGHT/LEFT HEART CATH AND CORONARY ANGIOGRAPHY;  Surgeon: Kyra Phy, MD;  Location: [facility] INVASIVE CV LAB;  Service: Cardiovascular;  Laterality: N/A;   TONSILLECTOMY     TOTAL HIP ARTHROPLASTY Left  06/11/2018   Procedure: LEFT TOTAL HIP ARTHROPLASTY ANTERIOR APPROACH;  Surgeon: Jasmine Mesi, MD;  Location: [facility] OR;  Service: Orthopedics;  Laterality: Left;   TOTAL KNEE ARTHROPLASTY Left 08/22/2016   Procedure: TOTAL KNEE ARTHROPLASTY;  Surgeon: Jasmine Mesi, MD;  Location: [facility] OR;  Service: Orthopedics;  Laterality: Left;   TUBAL LIGATION  1989    FAMILY HISTORY: Family History  Problem Relation Age of Onset   Emphysema Mother    Arthritis Mother    Heart failure Mother        alive @ 27   Stroke Mother    Diabetes Mother    Hypertension Mother    Hyperlipidemia Mother    Depression Mother    Anxiety disorder Mother    Heart disease Mother    Alcoholism Mother    Depression Father    Heart disease Father        died @ 6's.   Stroke Father    Diabetes Father    Hyperlipidemia Father    Hypertension Father    Bipolar disorder Father    Sleep apnea Father    Alcoholism Father    Drug abuse Father    Sudden death Father    Diabetes Sister    Asthma Brother    Colon cancer Maternal Grandfather        dx after 50   Heart disease Paternal Grandfather    Breast cancer Maternal Aunt 36 - 73   Breast cancer Cousin        dx  20s   Asthma Daughter    Esophageal cancer Neg Hx    Stomach cancer Neg Hx    Rectal cancer Neg Hx     SOCIAL HISTORY: Social History   Socioeconomic History   Marital status: Divorced    Spouse name: Not on file   Number of children: 3   Years of education: Not on file   Highest education level: Not on file  Occupational History   Occupation: UNEMPLOYED    Employer: DISABLED  Tobacco Use   Smoking status: Former    Current packs/day: 0.00    Average packs/day: 0.5 packs/day for 15.0 years (7.5 ttl pk-yrs)    Types: Cigarettes    Start date: 04/24/1970    Quit date: 04/24/1985    Years since quitting: 38.4   Smokeless tobacco: Never  Vaping Use   Vaping status: Never Used  Substance and Sexual Activity   Alcohol use: No     Alcohol/week: 0.0 standard drinks of alcohol   Drug use: Not Currently    Types: Marijuana   Sexual activity: Not Currently    Birth control/protection: Surgical    Comment: hyst  Other Topics Concern   Not on file  Social History Narrative   Lives in [place] by herself.  Disabled.  Occasional Tea .  3 kids, 6 grandkids.     Water aerobics 3 times a week.   Social Drivers of Corporate investment banker Strain: High Risk (03/27/2022)   Overall Financial Resource Strain (CARDIA)    Difficulty of Paying Living Expenses: Very hard  Food Insecurity: Food Insecurity Present (03/27/2022)   Hunger Vital Sign    Worried About Running Out of Food in the Last Year: Sometimes true    Ran Out of Food in the Last Year: Sometimes true  Transportation Needs: No Transportation Needs (03/27/2022)   PRAPARE - Administrator, Civil Service (Medical): No    Lack of Transportation (Non-Medical): No  Physical Activity: Not on file  Stress: Not on file  Social Connections: Not on file  Intimate Partner Violence: Not At Risk (03/28/2022)   Humiliation, Afraid, Rape, and Kick questionnaire    Fear of Current or Ex-Partner: No    Emotionally Abused: No    Physically Abused: No    Sexually Abused: No      PHYSICAL EXAM  There were no vitals filed for this visit.   There is no height or weight on file to calculate BMI.  Generalized: Well developed, in no acute distress  Chest: Lungs clear to auscultation bilaterally  Neurological examination  Mentation: Alert oriented to time, place, history taking. Follows all commands speech and language fluent Cranial nerve II-XII: Facial symmetry noted  DIAGNOSTIC DATA (LABS, IMAGING, TESTING) - I reviewed patient records, labs, notes, testing and imaging myself where available.  Lab Results  Component Value Date   WBC 4.7 07/25/2023   HGB 14.0 07/25/2023   HCT 43.8 07/25/2023   MCV 91.1 07/25/2023   PLT 210 07/25/2023      Component Value  Date/Time   NA 140 07/25/2023 0952   NA 143 06/05/2023 0943   K 4.3 07/25/2023 0952   CL 107 07/25/2023 0952   CO2 25 07/25/2023 0952   GLUCOSE 111 (H) 07/25/2023 0952   BUN 15 07/25/2023 0952   BUN 13 06/05/2023 0943   CREATININE 1.03 (H) 07/25/2023 0952   CREATININE 0.85 03/03/2022 1250   CALCIUM  9.4 07/25/2023 0952   PROT 6.7 06/05/2023  1660   ALBUMIN 4.2 06/05/2023 0943   AST 22 06/05/2023 0943   AST 14 (L) 03/03/2022 1250   ALT 30 06/05/2023 0943   ALT 18 03/03/2022 1250   ALKPHOS 72 06/05/2023 0943   BILITOT 0.5 06/05/2023 0943   BILITOT 0.5 03/03/2022 1250   GFRNONAA >60 07/25/2023 0952   GFRNONAA >60 03/03/2022 1250   GFRAA 76 02/03/2020 0845   Lab Results  Component Value Date   CHOL 127 07/16/2022   HDL 48 07/16/2022   LDLCALC 55 07/16/2022   TRIG 119 07/16/2022   CHOLHDL 2.6 07/16/2022   Lab Results  Component Value Date   HGBA1C 7.1 (H) 05/07/2023   Lab Results  Component Value Date   VITAMINB12 460 05/07/2023   Lab Results  Component Value Date   TSH 0.746 11/07/2022      ASSESSMENT AND PLAN 63 y.o. year old female  has a past medical history of Allergy, Anginal pain (HCC), Anxiety, Arthritis, Asthma, Back pain, Bone cancer (HCC), Cancer (HCC), Chest pain, CHF (congestive heart failure) (HCC), Clotting disorder (HCC), Constipation, Depression, Diabetes mellitus, Drug use, Dyspnea, Frequent urination, GERD (gastroesophageal reflux disease), Glaucoma, History of stomach ulcers, HLD (hyperlipidemia), Hypertension, IBS (irritable bowel syndrome), Joint pain, Joint pain, Lactose intolerance, Leg edema, Mediastinal mass, Neuromuscular disorder (HCC), Obesity, Palpitations, Pneumonia (05/2016), Pulmonary edema, Pulmonary embolism (HCC), Rheumatoid arthritis (HCC), Sleep apnea, SOB (shortness of breath), Thyroid  disease, TIA (transient ischemic attack), Urinary urgency, and Vitamin D  deficiency. here with:  OSA on CPAP  - CPAP compliance excellent - Good  treatment of AHI  - Encourage patient to use CPAP nightly and > 4 hours each night. - F/U in 1 year or sooner if needed     Clem Currier, MSN, NP-C 10/02/2023, 7:58 AM [facility] [address] [place], [place] [address] [phone]

## 2023-10-03 ENCOUNTER — Telehealth: Payer: Self-pay | Admitting: Neurology

## 2023-10-03 NOTE — Telephone Encounter (Signed)
 Pt called to schedule appt .  Appt Scheduled

## 2023-10-09 ENCOUNTER — Ambulatory Visit (INDEPENDENT_AMBULATORY_CARE_PROVIDER_SITE_OTHER): Admitting: Adult Health

## 2023-10-09 ENCOUNTER — Encounter (INDEPENDENT_AMBULATORY_CARE_PROVIDER_SITE_OTHER): Payer: Self-pay | Admitting: Adult Health

## 2023-10-09 VITALS — BP 109/72 | HR 76 | Temp 98.2°F | Ht 59.0 in | Wt 197.0 lb

## 2023-10-09 DIAGNOSIS — E1159 Type 2 diabetes mellitus with other circulatory complications: Secondary | ICD-10-CM | POA: Diagnosis not present

## 2023-10-09 DIAGNOSIS — Z6841 Body Mass Index (BMI) 40.0 and over, adult: Secondary | ICD-10-CM

## 2023-10-09 DIAGNOSIS — R0602 Shortness of breath: Secondary | ICD-10-CM | POA: Diagnosis not present

## 2023-10-09 DIAGNOSIS — Z7985 Long-term (current) use of injectable non-insulin antidiabetic drugs: Secondary | ICD-10-CM

## 2023-10-09 DIAGNOSIS — E669 Obesity, unspecified: Secondary | ICD-10-CM

## 2023-10-09 DIAGNOSIS — E1165 Type 2 diabetes mellitus with hyperglycemia: Secondary | ICD-10-CM | POA: Diagnosis not present

## 2023-10-09 DIAGNOSIS — E559 Vitamin D deficiency, unspecified: Secondary | ICD-10-CM

## 2023-10-09 DIAGNOSIS — I152 Hypertension secondary to endocrine disorders: Secondary | ICD-10-CM

## 2023-10-09 DIAGNOSIS — Z7984 Long term (current) use of oral hypoglycemic drugs: Secondary | ICD-10-CM

## 2023-10-09 DIAGNOSIS — Z6839 Body mass index (BMI) 39.0-39.9, adult: Secondary | ICD-10-CM

## 2023-10-09 MED ORDER — VITAMIN D (ERGOCALCIFEROL) 1.25 MG (50000 UNIT) PO CAPS
50000.0000 [IU] | ORAL_CAPSULE | ORAL | 0 refills | Status: DC
Start: 2023-10-09 — End: 2023-11-07

## 2023-10-09 MED ORDER — TIRZEPATIDE 5 MG/0.5ML ~~LOC~~ SOAJ: 5.0000 mg | SUBCUTANEOUS | 0 refills | Status: DC

## 2023-10-09 MED ORDER — METFORMIN HCL 500 MG PO TABS
ORAL_TABLET | ORAL | 0 refills | Status: DC
Start: 2023-10-09 — End: 2023-11-07

## 2023-10-09 NOTE — Progress Notes (Signed)
 WEIGHT SUMMARY AND BIOMETRICS  Vitals Temp: 98.2 F (36.8 C) BP: 109/72 Pulse Rate: 76 SpO2: 100 %   Anthropometric Measurements Height: 4' 11 (1.499 m) Weight: 197 lb (89.4 kg) BMI (Calculated): 39.77 Weight at Last Visit: 199 lb Weight Lost Since Last Visit: 2 lb Weight Gained Since Last Visit: 0 Starting Weight: 209 lb Total Weight Loss (lbs): 12 lb (5.443 kg) Peak Weight: 245 lb   Body Composition  Body Fat %: 47.6 % Fat Mass (lbs): 94 lbs Muscle Mass (lbs): 98.4 lbs Total Body Water (lbs): 74 lbs Visceral Fat Rating : 15   Other Clinical Data Fasting: yes Labs: yes Today's Visit #: 26 Starting Date: 06/01/21 Comments: CAT 2    Chief Complaint:   OBESITY [name] is here to discuss her progress with her obesity treatment plan.  She is on the the Category 2 Plan and states she is following her eating plan approximately 70 % of the time.  She states she is exercising Water Aerobics 45 minutes 3 times per week.   Interim History:  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 Shortness of breath on exertion Unable to complete IC today- will attempt at next OV Pt instructed to arrive 30 mins early and to be fasting for IC to be accomplished  2. Vitamin D  deficiency  Latest Reference Range & Units 05/07/23 11:47  Vitamin D , 25-Hydroxy 30.0 - 100.0 ng/mL 51.0       She is on weekly Ergocalciferol - denies N/V/Muscle Weakness  3. Type 2 diabetes mellitus with hyperglycemia, without long-term current use of insulin   (HCC) Lab Results  Component Value Date   HGBA1C 7.1 (H) 05/07/2023   HGBA1C 7.9 (H) 12/11/2022   HGBA1C 7.3 (H) 07/15/2022     Latest Reference Range & Units 05/07/23 11:47 06/05/23 09:43  Glucose 70 - 99 mg/dL  409 (H)  Hemoglobin W1X 4.8 - 5.6 % 7.1 (H)   Est. average glucose Bld gHb Est-mCnc mg/dL 914   INSULIN  2.6 - 24.9 uIU/mL  20.2  (H): Data is abnormally high  Vitamin B12 232 - 1,245 pg/mL 460   Her fasting CBG will typically run <120 She denies sx's of hypoglycemia Denies mass in neck, dysphagia, dyspepsia, persistent hoarseness, abdominal pain, or N/V/C  She is currently on: atorvastatin  (LIPITOR) 40 MG tablet- 1 tab daily  aspirin  81 MG chewable tablet- 1 tab daily  empagliflozin  (JARDIANCE ) 10 MG TABS tablet- 1 tab daily  metFORMIN  (GLUCOPHAGE ) 500 MG tablet- 1/2 tab daily  tirzepatide  (MOUNJARO ) 5 MG/0.5ML Pen- once weekly injection    Full tab of Metformin  500mg  will cause nausea without vomiting, she can tolerated 1/2 tab She is still experiencing injection site reactions after weekly Mounjaro  5mg , ie: erythema and pruritus  She denies respiratory sx's or changes in her tongue/mouth   Assessment/Plan:   1. Shortness of breath on exertion Check IC at next OV- pt aware to arrive 30 mins early and to be fasting  2. Vitamin D  deficiency Check Labs - VITAMIN D  25 Hydroxy (Vit-D Deficiency, Fractures) Refill  Vitamin D , Ergocalciferol , (  DRISDOL ) 1.25 MG (50000 UNIT) CAPS capsule Take 1 capsule (50,000 Units total) by mouth every 7 (seven) days. Dispense: 4 capsule, Refills: 0 ordered   3. Type 2 diabetes mellitus with hyperglycemia, without long-term current use of insulin  (HCC) Check Labs - Hemoglobin A1c - Insulin , random - Magnesium  Refill   tirzepatide  (MOUNJARO ) 5 MG/0.5ML Pen Inject 5 mg into the skin once a week. Dispense: 2 mL, Refills: 0 ordered  Refill metFORMIN  (GLUCOPHAGE ) 500 MG tablet 1/2 tab daily with breakfast Dispense: 30 tablet, Refills:  0 ordered   4. Hypertension associated with type 2 diabetes mellitus (HCC) Continue healthy eating and regular exercise  5. BMI 40.0-44.9, adult (HCC) Current BMI 39.9  [name] is currently in the action stage of change. As such, her goal is to continue with weight loss efforts. She has agreed to the Category 2 Plan.   Exercise goals: All adults should avoid inactivity. Some physical activity is better than none, and adults who participate in any amount of physical activity gain some health benefits. Adults should also include muscle-strengthening activities that involve all major muscle groups on 2 or more days a week.  Behavioral modification strategies: increasing lean protein intake, decreasing simple carbohydrates, increasing vegetables, increasing water intake, no skipping meals, meal planning and cooking strategies, keeping healthy foods in the home, and planning for success.  [name] has agreed to follow-up with our clinic in 4 weeks. She was informed of the importance of frequent follow-up visits to maximize her success with intensive lifestyle modifications for her multiple health conditions.   Check IC at next OV- pt aware to arrive 30 mins early and to be fasting  Objective:   Blood pressure 109/72, pulse 76, temperature 98.2 F (36.8 C), height 4' 11 (1.499 m), weight 197 lb (89.4 kg), SpO2 100%. Body mass index is 39.79 kg/m.  General: Cooperative, alert, well developed, in no acute distress. HEENT: Conjunctivae and lids unremarkable. Cardiovascular: Regular rhythm.  Lungs: Normal work of breathing. Neurologic: No focal deficits.   Lab Results  Component Value Date   CREATININE 1.03 (H) 07/25/2023   BUN 15 07/25/2023   NA 140 07/25/2023   K 4.3 07/25/2023   CL 107 07/25/2023   CO2 25 07/25/2023   Lab Results  Component Value Date   ALT 30 06/05/2023   AST 22 06/05/2023   ALKPHOS 72 06/05/2023   BILITOT 0.5 06/05/2023   Lab Results  Component Value Date    HGBA1C 7.1 (H) 05/07/2023   HGBA1C 7.9 (H) 12/11/2022   HGBA1C 7.3 (H) 07/15/2022   HGBA1C 6.4 (H) 09/28/2021   HGBA1C 8.4 (H) 03/08/2021   Lab Results  Component Value Date   INSULIN  20.2 06/05/2023   INSULIN  13.4 08/14/2022   INSULIN  22.1 09/28/2021   INSULIN  26.8 (H) 06/01/2021   INSULIN  44.4 (H) 02/03/2020   Lab Results  Component Value Date   TSH 0.746 11/07/2022   Lab Results  Component Value Date   CHOL 127 07/16/2022   HDL 48 07/16/2022   LDLCALC 55 07/16/2022   TRIG 119 07/16/2022   CHOLHDL 2.6 07/16/2022   Lab Results  Component Value Date   VD25OH 51.0 05/07/2023   VD25OH 33.5 01/11/2023   VD25OH 30.3 08/14/2022   Lab Results  Component Value Date   WBC 4.7 07/25/2023   HGB 14.0 07/25/2023   HCT 43.8 07/25/2023   MCV 91.1 07/25/2023   PLT 210 07/25/2023   No results found for: IRON, TIBC, FERRITIN  Attestation Statements:  Reviewed by clinician on day of visit: allergies, medications, problem list, medical history, surgical history, family history, social history, and previous encounter notes.  I have reviewed the above documentation for accuracy and completeness, and I agree with the above. -  Juwuan Sedita d. Bronx Brogden, NP-C

## 2023-10-10 LAB — COMPREHENSIVE METABOLIC PANEL WITH GFR
ALT: 23 IU/L (ref 0–32)
AST: 16 IU/L (ref 0–40)
Albumin: 4.2 g/dL (ref 3.9–4.9)
Alkaline Phosphatase: 64 IU/L (ref 44–121)
BUN/Creatinine Ratio: 14 (ref 12–28)
BUN: 15 mg/dL (ref 8–27)
Bilirubin Total: 0.3 mg/dL (ref 0.0–1.2)
CO2: 22 mmol/L (ref 20–29)
Calcium: 9.6 mg/dL (ref 8.7–10.3)
Chloride: 104 mmol/L (ref 96–106)
Creatinine, Ser: 1.05 mg/dL — ABNORMAL HIGH (ref 0.57–1.00)
Globulin, Total: 2.1 g/dL (ref 1.5–4.5)
Glucose: 113 mg/dL — ABNORMAL HIGH (ref 70–99)
Potassium: 4.1 mmol/L (ref 3.5–5.2)
Sodium: 143 mmol/L (ref 134–144)
Total Protein: 6.3 g/dL (ref 6.0–8.5)
eGFR: 60 mL/min/{1.73_m2} (ref >59)

## 2023-10-10 LAB — INSULIN, RANDOM: INSULIN: 19.1 u[IU]/mL (ref 2.6–24.9)

## 2023-10-10 LAB — VITAMIN D 25 HYDROXY (VIT D DEFICIENCY, FRACTURES): Vit D, 25-Hydroxy: 46.6 ng/mL (ref 30.0–100.0)

## 2023-10-10 LAB — HEMOGLOBIN A1C
Est. average glucose Bld gHb Est-mCnc: 140 mg/dL
Hgb A1c MFr Bld: 6.5 % — ABNORMAL HIGH (ref 4.8–5.6)

## 2023-10-10 LAB — MAGNESIUM: Magnesium: 2.1 mg/dL (ref 1.6–2.3)

## 2023-10-10 LAB — VITAMIN B12: Vitamin B-12: 326 pg/mL (ref 232–1245)

## 2023-11-07 ENCOUNTER — Telehealth (INDEPENDENT_AMBULATORY_CARE_PROVIDER_SITE_OTHER): Payer: Self-pay | Admitting: Adult Health

## 2023-11-07 ENCOUNTER — Encounter (INDEPENDENT_AMBULATORY_CARE_PROVIDER_SITE_OTHER): Payer: Self-pay | Admitting: Adult Health

## 2023-11-07 ENCOUNTER — Ambulatory Visit (INDEPENDENT_AMBULATORY_CARE_PROVIDER_SITE_OTHER): Admitting: Adult Health

## 2023-11-07 VITALS — BP 128/76 | HR 68 | Temp 98.7°F | Ht 59.0 in | Wt 198.0 lb

## 2023-11-07 DIAGNOSIS — Z6841 Body Mass Index (BMI) 40.0 and over, adult: Secondary | ICD-10-CM

## 2023-11-07 DIAGNOSIS — I152 Hypertension secondary to endocrine disorders: Secondary | ICD-10-CM

## 2023-11-07 DIAGNOSIS — T8090XS Unspecified complication following infusion and therapeutic injection, sequela: Secondary | ICD-10-CM

## 2023-11-07 DIAGNOSIS — E1159 Type 2 diabetes mellitus with other circulatory complications: Secondary | ICD-10-CM | POA: Diagnosis not present

## 2023-11-07 DIAGNOSIS — E1165 Type 2 diabetes mellitus with hyperglycemia: Secondary | ICD-10-CM

## 2023-11-07 DIAGNOSIS — E669 Obesity, unspecified: Secondary | ICD-10-CM

## 2023-11-07 DIAGNOSIS — E559 Vitamin D deficiency, unspecified: Secondary | ICD-10-CM

## 2023-11-07 MED ORDER — METFORMIN HCL ER 500 MG PO TB24: 500.0000 mg | ORAL_TABLET | Freq: Every day | ORAL | 0 refills | Status: DC

## 2023-11-07 MED ORDER — VITAMIN D (ERGOCALCIFEROL) 1.25 MG (50000 UNIT) PO CAPS: 50000.0000 [IU] | ORAL_CAPSULE | ORAL | 0 refills | Status: DC

## 2023-11-07 NOTE — Telephone Encounter (Signed)
 Patient called back

## 2023-11-07 NOTE — Progress Notes (Signed)
 WEIGHT SUMMARY AND BIOMETRICS  Vitals Temp: 98.7 F (37.1 C) BP: 128/76 Pulse Rate: 68 SpO2: 97 %   Anthropometric Measurements Height: 4' 11 (1.499 m) Weight: 198 lb (89.8 kg) BMI (Calculated): 39.97 Weight at Last Visit: 197 lb Weight Lost Since Last Visit: 0 Weight Gained Since Last Visit: 1 lb Starting Weight: 209 lb Total Weight Loss (lbs): 11 lb (4.99 kg) Peak Weight: 245 lb   Body Composition  Body Fat %: 50.1 % Fat Mass (lbs): 99.4 lbs Muscle Mass (lbs): 94.2 lbs Total Body Water (lbs): 75 lbs Visceral Fat Rating : 17   Other Clinical Data Fasting: no Labs: no Today's Visit #: 27 Starting Date: 06/01/21 Comments: Cat #2    Chief Complaint:   OBESITY [name] is here to discuss her progress with her obesity treatment plan.  She is on the the Category 2 Plan and states she is following her eating plan approximately 75-80 % of the time.  She states she is exercising [employer] 45 minutes 2-3 times per week.  Interim History:  She continues to experience injection site reactions after her weekly Mounjaro  5mg  She will experience redness, pruritus. She denies numbness/tingling her mouth, throat irritation, cough, or wheezing. Her last dose was 10/29/2023- she held this weeks dose.  She is also on daily Metformin  500mg  1/2 tab. Home CBG readings: Fasting 110-130s PP: < 180 She denies sx's of hypoglycemia  Subjective:   1. Injection site reaction, sequela She continues to experience injection site reactions after her weekly Mounjaro  5mg  She will experience redness, pruritus. She denies numbness/tingling her mouth, throat irritation, cough, or wheezing. Her last dose was 10/29/2023- she held this weeks dose.  2. Type 2 diabetes mellitus with hyperglycemia, without long-term current use of insulin  (HCC) Discussed Labs  Latest Reference Range & Units 10/09/23 08:28  eGFR >59 mL/min/1.73 60    Latest Reference Range & Units 10/09/23 08:28  Glucose 70 -  99 mg/dL 886 (H)  Hemoglobin J8R 4.8 - 5.6 % 6.5 (H)  Est. average glucose Bld gHb Est-mCnc mg/dL 859  INSULIN  2.6 - 24.9 uIU/mL 19.1  (H): Data is abnormally high  GFR stable CBG elevated A1c improved and at goal Insulin  level improved, however still above goal Due to injection site reaction-she stopped Mounjaro  5mg .  Last dose was on 10/29/2023 She is also on daily Metformin  500mg  1/2 tab. Home CBG readings: Fasting 110-130s PP: < 180 She denies sx's of hypoglycemia  Of note- Ozempic  was intolerable due to bloating Higher doses of plain Metformin  causes diarrhea  Discussed at length the need to keep A1c at goal and after stopping Mounjaro  will need to increase Metformin . Rec changing from plain Metformin  to XR  3. Hypertension associated with type 2 diabetes mellitus (HCC) Discussed Labs  10/09/2023 RFE:Rmzju slightly elevated, GFR normal Liver Enzymes and Electrolytes- stable  4. Vitamin D  deficiency Discussed Labs  Latest Reference Range & Units 05/07/23 11:47 10/09/23 08:28  Vitamin D , 25-Hydroxy 30.0 - 100.0 ng/mL 51.0 46.6   Vit D Level stable She is on weekly Ergocalciferol , she denies N/V/Muscle Weakness  Assessment/Plan:   1. Injection site reaction, sequela Remain off Mounjaro - MAR updated  2. Type 2 diabetes mellitus with hyperglycemia, without long-term current use of insulin  (HCC) (Primary) Remain off Mounjaro  Refill and INCREASE metFORMIN  (GLUCOPHAGE -XR) 500 MG 24 hr tablet Take 1 tablet (500 mg total) by mouth daily with breakfast. Dispense: 30 tablet, Refills: 0 ordered   3. Hypertension associated with type 2  diabetes mellitus (HCC) Limit Na+ Continue healthy eating and regular exercise  4. Vitamin D  deficiency Refill - Vitamin D , Ergocalciferol , (DRISDOL ) 1.25 MG (50000 UNIT) CAPS capsule; Take 1 capsule (50,000 Units total) by mouth every 7 (seven) days.  Dispense: 4 capsule; Refill: 0  5. BMI 40.0-44.9, adult (HCC) Current BMI 41.5  [name]  is currently in the action stage of change. As such, her goal is to continue with weight loss efforts. She has agreed to the Category 2 Plan.   Exercise goals: All adults should avoid inactivity. Some physical activity is better than none, and adults who participate in any amount of physical activity gain some health benefits. Adults should also include muscle-strengthening activities that involve all major muscle groups on 2 or more days a week.  Behavioral modification strategies: increasing lean protein intake, decreasing simple carbohydrates, increasing vegetables, increasing water intake, decreasing eating out, no skipping meals, meal planning and cooking strategies, keeping healthy foods in the home, and planning for success.  [name] has agreed to follow-up with our clinic in 4 weeks. She was informed of the importance of frequent follow-up visits to maximize her success with intensive lifestyle modifications for her multiple health conditions.   Objective:   Blood pressure 128/76, pulse 68, temperature 98.7 F (37.1 C), height 4' 11 (1.499 m), weight 198 lb (89.8 kg), SpO2 97%. Body mass index is 39.99 kg/m.  General: Cooperative, alert, well developed, in no acute distress. HEENT: Conjunctivae and lids unremarkable. Cardiovascular: Regular rhythm.  Lungs: Normal work of breathing. Neurologic: No focal deficits.   Lab Results  Component Value Date   CREATININE 1.05 (H) 10/09/2023   BUN 15 10/09/2023   NA 143 10/09/2023   K 4.1 10/09/2023   CL 104 10/09/2023   CO2 22 10/09/2023   Lab Results  Component Value Date   ALT 23 10/09/2023   AST 16 10/09/2023   ALKPHOS 64 10/09/2023   BILITOT 0.3 10/09/2023   Lab Results  Component Value Date   HGBA1C 6.5 (H) 10/09/2023   HGBA1C 7.1 (H) 05/07/2023   HGBA1C 7.9 (H) 12/11/2022   HGBA1C 7.3 (H) 07/15/2022   HGBA1C 6.4 (H) 09/28/2021   Lab Results  Component Value Date   INSULIN  19.1 10/09/2023   INSULIN  20.2 06/05/2023    INSULIN  13.4 08/14/2022   INSULIN  22.1 09/28/2021   INSULIN  26.8 (H) 06/01/2021   Lab Results  Component Value Date   TSH 0.746 11/07/2022   Lab Results  Component Value Date   CHOL 127 07/16/2022   HDL 48 07/16/2022   LDLCALC 55 07/16/2022   TRIG 119 07/16/2022   CHOLHDL 2.6 07/16/2022   Lab Results  Component Value Date   VD25OH 46.6 10/09/2023   VD25OH 51.0 05/07/2023   VD25OH 33.5 01/11/2023   Lab Results  Component Value Date   WBC 4.7 07/25/2023   HGB 14.0 07/25/2023   HCT 43.8 07/25/2023   MCV 91.1 07/25/2023   PLT 210 07/25/2023   No results found for: IRON, TIBC, FERRITIN  Attestation Statements:   Reviewed by clinician on day of visit: allergies, medications, problem list, medical history, surgical history, family history, social history, and previous encounter notes.  I have reviewed the above documentation for accuracy and completeness, and I agree with the above. -  Chelan Heringer d. Jowel Waltner,NP-C

## 2023-11-07 NOTE — Telephone Encounter (Signed)
 7/16 Monjaro giving pt allergic reaction, ask if she can be placed on another form of meds.

## 2023-11-07 NOTE — Telephone Encounter (Signed)
 After trying to contact patient two times, finally did reach out to patient after third attempt , said that she has been having an allergic reaction to her medication(itching and redness and raised area at the injection site on second days after injection of the  Mounjaro ) she said that she had discuss with the physician but was not told to discontinue. She is scheduled to come in today @2 :30 to discuss and change to a different medication.

## 2023-11-12 ENCOUNTER — Emergency Department (HOSPITAL_COMMUNITY)
Admission: EM | Admit: 2023-11-12 | Discharge: 2023-11-12 | Disposition: A | Discharge status: Home / Self Care | Attending: Emergency Medicine | Admitting: Emergency Medicine

## 2023-11-12 ENCOUNTER — Emergency Department (HOSPITAL_COMMUNITY)

## 2023-11-12 ENCOUNTER — Encounter (HOSPITAL_COMMUNITY): Payer: Self-pay | Admitting: Emergency Medicine

## 2023-11-12 ENCOUNTER — Other Ambulatory Visit: Payer: Self-pay

## 2023-11-12 DIAGNOSIS — R110 Nausea: Secondary | ICD-10-CM | POA: Diagnosis not present

## 2023-11-12 DIAGNOSIS — R0789 Other chest pain: Secondary | ICD-10-CM | POA: Insufficient documentation

## 2023-11-12 DIAGNOSIS — I110 Hypertensive heart disease with heart failure: Secondary | ICD-10-CM | POA: Diagnosis not present

## 2023-11-12 DIAGNOSIS — Z7982 Long term (current) use of aspirin: Secondary | ICD-10-CM | POA: Diagnosis not present

## 2023-11-12 DIAGNOSIS — Z79899 Other long term (current) drug therapy: Secondary | ICD-10-CM | POA: Diagnosis not present

## 2023-11-12 DIAGNOSIS — M79602 Pain in left arm: Secondary | ICD-10-CM | POA: Diagnosis not present

## 2023-11-12 DIAGNOSIS — Z91040 Latex allergy status: Secondary | ICD-10-CM | POA: Diagnosis not present

## 2023-11-12 DIAGNOSIS — Z853 Personal history of malignant neoplasm of breast: Secondary | ICD-10-CM | POA: Insufficient documentation

## 2023-11-12 DIAGNOSIS — E119 Type 2 diabetes mellitus without complications: Secondary | ICD-10-CM | POA: Insufficient documentation

## 2023-11-12 DIAGNOSIS — R079 Chest pain, unspecified: Secondary | ICD-10-CM

## 2023-11-12 DIAGNOSIS — R0602 Shortness of breath: Secondary | ICD-10-CM | POA: Diagnosis not present

## 2023-11-12 DIAGNOSIS — I509 Heart failure, unspecified: Secondary | ICD-10-CM | POA: Diagnosis not present

## 2023-11-12 LAB — BASIC METABOLIC PANEL WITH GFR
Anion gap: 8 (ref 5–15)
BUN: 18 mg/dL (ref 8–23)
CO2: 24 mmol/L (ref 22–32)
Calcium: 9.4 mg/dL (ref 8.9–10.3)
Chloride: 107 mmol/L (ref 98–111)
Creatinine, Ser: 1.04 mg/dL — ABNORMAL HIGH (ref 0.44–1.00)
GFR, Estimated: >60 mL/min (ref >60)
Glucose, Bld: 134 mg/dL — ABNORMAL HIGH (ref 70–99)
Potassium: 3.8 mmol/L (ref 3.5–5.1)
Sodium: 139 mmol/L (ref 135–145)

## 2023-11-12 LAB — CBC
HCT: 40.3 % (ref 36.0–46.0)
Hemoglobin: 13 g/dL (ref 12.0–15.0)
MCH: 29.9 pg (ref 26.0–34.0)
MCHC: 32.3 g/dL (ref 30.0–36.0)
MCV: 92.6 fL (ref 80.0–100.0)
Platelets: 195 K/uL (ref 150–400)
RBC: 4.35 MIL/uL (ref 3.87–5.11)
RDW: 13.2 % (ref 11.5–15.5)
WBC: 5.3 K/uL (ref 4.0–10.5)
nRBC: 0 % (ref 0.0–0.2)

## 2023-11-12 LAB — TROPONIN I (HIGH SENSITIVITY)
Troponin I (High Sensitivity): 9 ng/L (ref <18)
Troponin I (High Sensitivity): 9 ng/L (ref <18)

## 2023-11-12 MED ORDER — IOHEXOL 350 MG/ML SOLN
75.0000 mL | Freq: Once | INTRAVENOUS | Status: AC | PRN
  Administered 2023-11-12: 75 mL via INTRAVENOUS

## 2023-11-12 MED ORDER — FENTANYL CITRATE PF 50 MCG/ML IJ SOSY
50.0000 ug | PREFILLED_SYRINGE | Freq: Once | INTRAMUSCULAR | Status: AC
  Administered 2023-11-12: 50 ug via INTRAVENOUS
  Filled 2023-11-12: qty 1

## 2023-11-12 NOTE — ED Triage Notes (Signed)
 Pt BIB [facility] for CP from PCP.  CP started around hr ago.  PCP ECG read STEMI. EMS 12-lead did not.  EMS states pt complains mid-sternal cp radiating to back.   324 ASA, 1 nitroglycerin  given BP dropped from 148 to 110 after nitro  148 palp, HR 60  CBG 161 96% RA  18G L. AC

## 2023-11-12 NOTE — ED Provider Notes (Signed)
 [facility] Provider Note   CSN: [id number] Arrival date & time: 11/12/23  1547   Patient presents with: Chest Pain   [name] is a 63 y.o. female with PMHx of CHF, hyperlipidemia, T2DM, hypertension, IBS, latent syphilis, right breast CA s/p surgery + chemoradiation, unspecified prior bone cancer, OSA, and prior PE no longer on [facility] who presents with roughly 1 hour of central chest pain/pressure.  Patient states that the chest pain began while she was playing bingo at outpatient health facility, states that prior to this she felt slightly off today but no specific symptoms until the chest discomfort. Rates it initially a 10/10 with radiation of the sharp/squeezing sensation to her mid back, with associated SOB due to worsening chest pain when she breathes deeply. She also endorses onset of nausea with her CP. Right before the CP began, she felt dizzy and had blurred vision but did not have syncope or collapse and these symptoms resolved after a few seconds, but the chest pain persisted. She endorses left arm pain but no weakness/paresthesias. EMS gave 324 ASA and 1x nitroglycerin  which patient states did not change her pain, though she does note her CP has improved slightly to 8/10 since onset.   Prior to Admission medications   Medication Sig Start Date End Date Taking? Authorizing Provider  ACCU-CHEK GUIDE test strip  10/28/21   [provider]  acetaminophen  (TYLENOL ) 500 MG tablet Take 1,000 mg by mouth 2 (two) times daily as needed for moderate pain.    [provider]  albuterol  (PROVENTIL  HFA;VENTOLIN  HFA) 108 (90 Base) MCG/ACT inhaler Inhale 2 puffs into the lungs every 6 (six) hours as needed for wheezing or shortness of breath.    [provider]  albuterol  (VENTOLIN  HFA) 108 (90 Base) MCG/ACT inhaler Inhale 1-2 puffs into the lungs every 6 (six) hours as needed for wheezing or shortness of breath. 07/25/23    Horton, Roxie HERO, DO  aspirin  81 MG chewable tablet Chew 81 mg by mouth daily.    [provider]  atorvastatin  (LIPITOR) 40 MG tablet Take 40 mg by mouth at bedtime.     [provider]  BIOTIN PO Take 1 tablet by mouth at bedtime.    [provider]  busPIRone  (BUSPAR ) 30 MG tablet Take 30 mg by mouth 2 (two) times daily. 12/18/19   [provider]  carvedilol  (COREG ) 3.125 MG tablet Take 1 tablet (3.125 mg total) by mouth 2 (two) times daily. 06/07/23   Wyn Jackee VEAR Mickey., NP  clindamycin  (CLEOCIN ) 150 MG capsule TAKE 4 CAPSULES 1 HOUR PRIOR TO PROCEDURE    [provider]  cyclobenzaprine  (FLEXERIL ) 10 MG tablet Take 1 tablet (10 mg total) by mouth 2 (two) times daily as needed for muscle spasms. 02/15/23   Elnor Jayson LABOR, DO  diclofenac  Sodium (VOLTAREN ) 1 % GEL Apply 2 g topically 4 (four) times daily as needed (joint/muscle pain). 09/20/19   [provider]  empagliflozin  (JARDIANCE ) 10 MG TABS tablet Take 1 tablet (10 mg total) by mouth daily. 03/28/22   Arrien, Elidia Sieving, MD  EPINEPHrine  0.3 mg/0.3 mL IJ SOAJ injection Inject 0.3 mg into the muscle as needed for anaphylaxis. Follow package instructions as needed for severe allergy or anaphylactic reaction. 11/06/21   Viviann Pastor, MD  fluticasone  furoate-vilanterol (BREO ELLIPTA ) 100-25 MCG/ACT AEPB Inhale 1 puff into the lungs daily. 10/01/23   Desai, Nikita S, MD  furosemide  (LASIX ) 40  MG tablet Take 1 tablet (40 mg total) by mouth daily as needed for edema or fluid (take in case of weight gain 2 to 3 lbs in 24 hrs or 5 lbs in 7 days.). 03/28/22 11/07/23  Arrien, Mauricio Daniel, MD  hydrocortisone 2.5 % cream SMARTSIG:sparingly Topical Twice Daily PRN 07/16/23   [provider]  hydrOXYzine  (VISTARIL ) 25 MG capsule Take 25 mg by mouth 3 (three) times daily as needed for itching.    [provider]  ipratropium-albuterol  (DUONEB) 0.5-2.5 (3) MG/3ML SOLN Take 3 mLs by  nebulization every 6 (six) hours as needed (shortness of breath). 11/22/22   [provider]  KLOR-CON  M10 10 MEQ tablet Take 10 mEq by mouth 2 (two) times daily. 12/07/22   [provider]  loperamide  (IMODIUM ) 2 MG capsule Take 1 capsule (2 mg total) by mouth 4 (four) times daily as needed for diarrhea or loose stools. 01/07/22   Long, Fonda MATSU, MD  losartan  (COZAAR ) 25 MG tablet Take 1 tablet (25 mg total) by mouth daily. 02/22/23 11/07/23  Lucien Orren SAILOR, PA-C  meloxicam  (MOBIC ) 7.5 MG tablet  06/18/23   [provider]  metFORMIN  (GLUCOPHAGE -XR) 500 MG 24 hr tablet Take 1 tablet (500 mg total) by mouth daily with breakfast. 11/07/23   Danford, Rockie D, NP  methylPREDNISolone  (MEDROL  DOSEPAK) 4 MG TBPK tablet Take as directed 07/25/23   Horton, Kristie M, DO  nitroGLYCERIN  (NITROSTAT ) 0.3 MG SL tablet Place 0.3 mg under the tongue every 5 (five) minutes as needed for chest pain.    [provider]  nystatin (MYCOSTATIN/NYSTOP) powder Apply 1 application topically 3 (three) times daily as needed for rash.    [provider]  ondansetron  (ZOFRAN -ODT) 4 MG disintegrating tablet Take 1 tablet (4 mg total) by mouth every 8 (eight) hours as needed for nausea or vomiting. 10/15/22   Gretta, Meghan R, PA-C  oxyCODONE  (OXY IR/ROXICODONE ) 5 MG immediate release tablet Take 1 tablet by mouth every 6 (six) hours as needed.    [provider]  pantoprazole  (PROTONIX ) 40 MG tablet Take 40 mg by mouth daily. 10/12/23   [provider]  sertraline  (ZOLOFT ) 25 MG tablet Take 1 tablet (25 mg total) by mouth daily. 08/29/23   [place]keley Adelita PENNER, MD  spironolactone  (ALDACTONE ) 25 MG tablet Take 25 mg by mouth every other day. 09/22/23   [provider]  triamcinolone  ointment (KENALOG ) 0.1 % APPLY A THIN LAYER TO THE AFFECTED AREA(S) ON LEFT ARM BY TOPICAL ROUTE 2 TIMES PER DAY FOR 7 DAYS    [provider]  Vitamin D , Ergocalciferol , (DRISDOL ) 1.25  MG (50000 UNIT) CAPS capsule Take 1 capsule (50,000 Units total) by mouth every 7 (seven) days. 11/07/23   Danford, Rockie D, NP    Allergies: Shellfish allergy, Hydrocodone -acetaminophen , Penicillins, Gabapentin , Latex, and Sulfa antibiotics     Updated Vital Signs BP (!) 146/76   Pulse 98   Temp 97.8 F (36.6 C) (Oral)   Resp 18   SpO2 99%   Physical Exam Vitals reviewed.  Constitutional:      General: She is not in acute distress.    Appearance: She is not toxic-appearing or diaphoretic.  HENT:     Head: Normocephalic and atraumatic.     Nose: Nose normal. No rhinorrhea.     Mouth/Throat:     Mouth: Mucous membranes are moist.     Pharynx: Oropharynx is clear.  Eyes:     General: No scleral icterus.  Extraocular Movements: Extraocular movements intact.     Pupils: Pupils are equal, round, and reactive to light.  Cardiovascular:     Rate and Rhythm: Normal rate and regular rhythm.     Pulses:          Carotid pulses are 2+ on the right side and 2+ on the left side.      Radial pulses are 2+ on the right side and 2+ on the left side.       Dorsalis pedis pulses are 2+ on the right side and 2+ on the left side.     Heart sounds: No murmur heard.    No gallop.  Pulmonary:     Comments: Respiratory splinting, but otherwise no abnormal breath sounds or respiratory distress at rest Chest:     Chest wall: Tenderness (central chest pressure reproducible) present.  Abdominal:     Palpations: Abdomen is soft.     Tenderness: There is no abdominal tenderness. There is no right CVA tenderness, left CVA tenderness, guarding or rebound.  Musculoskeletal:        General: No deformity. Normal range of motion.     Cervical back: Normal range of motion and neck supple. No rigidity or tenderness.     Right lower leg: No edema.     Left lower leg: No edema.  Skin:    General: Skin is warm and dry.     Capillary Refill: Capillary refill takes less than 2 seconds.  Neurological:      Mental Status: She is alert and oriented to person, place, and time. Mental status is at baseline.     Sensory: No sensory deficit.     Motor: No weakness.     (all labs ordered are listed, but only abnormal results are displayed) Labs Reviewed  BASIC METABOLIC PANEL WITH GFR - Abnormal; Notable for the following components:      Result Value   Glucose, Bld 134 (*)    Creatinine, Ser 1.04 (*)    All other components within normal limits  CBC  TROPONIN I (HIGH SENSITIVITY)  TROPONIN I (HIGH SENSITIVITY)    EKG: None  Radiology: CT Angio Chest/Abd/Pel for Dissection W and/or Wo Contrast Result Date: 11/12/2023 EXAM: CTA CHEST, ABDOMEN AND PELVIS WITH AND WITHOUT CONTRAST 11/12/2023 07:59:00 PM TECHNIQUE: CTA of the chest was performed with and without the administration of intravenous contrast. CTA of the abdomen and pelvis was performed with the administration of intravenous contrast. Multiplanar reformatted images are provided for review. MIP images are provided for review. Automated exposure control, iterative reconstruction, and/or weight based adjustment of the mA/kV was utilized to reduce the radiation dose to as low as reasonably achievable. COMPARISON: CTA chest dated 07/25/2023 and CT abdomen/pelvis dated 03/13/2022. CLINICAL HISTORY: Severe chest pain, radiating to back. FINDINGS: VASCULATURE: AORTA: No evidence of thoracic or abdominal aortic aneurysm or dissection. On unenhanced CT, there is no evidence of intramural hematoma. PULMONARY ARTERIES: Although not tailored for evaluation of the pulmonary arteries, there is no evidence of pulmonary embolism. GREAT VESSELS OF AORTIC ARCH: No acute finding. No dissection. No arterial occlusion or significant stenosis. CELIAC TRUNK: No acute finding. No occlusion or significant stenosis. SUPERIOR MESENTERIC ARTERY: No acute finding. No occlusion or significant stenosis. INFERIOR MESENTERIC ARTERY: No acute finding. No occlusion or significant  stenosis. RENAL ARTERIES: No acute finding. No occlusion or significant stenosis. ILIAC ARTERIES: No acute finding. No occlusion or significant stenosis. CHEST: MEDIASTINUM: 2.6 cm right thyroid  nodule, previously evaluated  on a thyroid  ultrasound. As such, no dedicated follow up is suggested. 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 No suspicious mediastinal lymphadenopathy. LUNGS AND PLEURA: Mild atelectasis in the lingula, inferior right middle lobe, and bilateral lower lobes. No focal consolidation or pulmonary edema. No evidence of pleural effusion or pneumothorax. THORACIC BONES AND SOFT TISSUES: Mild degenerative changes of the mid/lower thoracic spine. ABDOMEN AND PELVIS: LIVER: The liver is unremarkable. GALLBLADDER AND BILE DUCTS: Gallbladder is unremarkable. No biliary ductal dilatation. SPLEEN: The spleen is unremarkable. PANCREAS: The pancreas is unremarkable. ADRENAL GLANDS: Bilateral adrenal glands demonstrate no acute abnormality. KIDNEYS, URETERS AND BLADDER: No stones in the kidneys or ureters. No hydronephrosis. No perinephric or periureteral stranding. Urinary bladder is unremarkable. GI AND BOWEL: Stomach and duodenal sweep demonstrate no acute abnormality. There is no bowel obstruction. No abnormal bowel wall thickening or distension. Suspected normal appendix (image 22). REPRODUCTIVE: Status post hysterectomy. PERITONEUM AND RETROPERITONEUM: No ascites or free air. LYMPH NODES: No lymphadenopathy. ABDOMINAL BONES AND SOFT TISSUES: Sclerosis involving the bilateral sacroiliac joints, left greater than right, chronic. Left hip arthroplasty, without evidence of complication. IMPRESSION: 1. No evidence of thoracic or abdominal aortic aneurysm or dissection. 2. No evidence of pulmonary embolism. 3. No acute findings in the chest, abdomen, or pelvis. Electronically signed by: Pinkie Pebbles MD 11/12/2023 08:11 PM EDT RP Workstation: [id number]   DG Chest 2 View Result  Date: 11/12/2023 CLINICAL DATA:  Chest pain. EXAM: CHEST - 2 VIEW COMPARISON:  07/25/2023 and CT chest 07/25/2023. FINDINGS: Trachea is midline. Heart is enlarged, stable. Lungs are somewhat low in volume with linear atelectasis in the lingula. Lungs are otherwise clear. No pleural fluid. Degenerative changes in the spine. IMPRESSION: No acute findings. Electronically Signed   By: Newell Eke M.D.   On: 11/12/2023 16:44     Medications Ordered in the ED  fentaNYL  (SUBLIMAZE ) injection 50 mcg (50 mcg Intravenous Given 11/12/23 1936)  iohexol  (OMNIPAQUE ) 350 MG/ML injection 75 mL (75 mLs Intravenous Contrast Given 11/12/23 1959)    Clinical Course as of 11/12/23 2345  Mon Nov 12, 2023  1846 DG Chest 2 View No acute findings. [AD]  8144 Basic metabolic panel(!) Cr at baseline 1.01, otherwise unremarkable BMP and CBC [AD]  1916 Troponin I (High Sensitivity): 9 [AD]  2026 CT Angio Chest/Abd/Pel for Dissection W and/or Wo Contrast No evidence of thoracic or abdominal aortic aneurysm or dissection. No evidence of pulmonary embolism. No acute findings in the chest, abdomen, or pelvis.   [AD]  2038 Troponin I (High Sensitivity): 9 [AD]    Clinical Course User Index [AD] Raoul Rake, MD    HEART Score: 6  Medical Decision Making Patient with the above history is presenting with ~1.5 hours of central chest tightness and pressure that radiated to the midback and some associated nausea, with preceding presyncopal symptoms of several seconds of lightheadedness and blurred vision that have since resolved. Her chest discomfort is pleuritic and reproducible and has been persistent at rest since onset, only mildly improved since initial but remains 8/10 severity and was unchanged by ASA + nitroglycerin  per EMS.  Differential diagnoses include ACS/MI, costochondritis/MSK-related chest pain, pleuritis, aortic dissection, PE, cardiac arrhythmia with presyncope. Overall lower concern for  dissection given patient has equal and intact peripheral pulses, is overall well-appearing/in NAD, and is normotensive, but will get imaging to rule out given patient's symptoms. Low concern for pneumonia given lack of cough or infectious symptoms.   As above, patient's workup  overall unremarkable with non-ischemic EKG in NSR, trops 9 -> 9, basic labs wnl, negative CXR, and negative CTA dissection study. Low concern at this time for acute cardiac emergency, dissection, or PE. Patient re-evaluated after dose of fentanyl  given and she stated she felt dizzy when the fentanyl  was given but otherwise is HDS and no longer complaining of chest pain. Patient and her daughter updated on results of workup and patient was deemed stable for discharge with close cardiology f/u (referral placed) and strict return precautions.   Amount and/or Complexity of Data Reviewed Labs: ordered. Decision-making details documented in ED Course. Radiology: ordered. Decision-making details documented in ED Course. ECG/medicine tests: ordered.  Risk Prescription drug management.    Final diagnoses:  Chest pain, unspecified type    ED Discharge Orders          Ordered    Ambulatory referral to Cardiology        11/12/23 2104               Raoul Rake, MD 11/12/23 [place]    Bernard Drivers, MD 11/26/23 [phone]

## 2023-11-12 NOTE — Discharge Instructions (Signed)
 You were seen today for chest pain. While you were here we monitored your vitals, performed a physical exam, and checked labs/imaging of your chest and abdomen. These were all reassuring and there is no indication for any further testing or intervention in the emergency department at this time.   Things to do:  - Follow up with your primary care provider within the next 1-2 weeks - Follow up with cardiology clinic as soon as possible for further evaluation: their office should call you in the next few business days to schedule appointment  Return to the emergency department if you have any new or worsening symptoms, or if you have any other serious medical concerns.

## 2023-11-13 ENCOUNTER — Encounter: Payer: Self-pay | Admitting: Physician Assistant

## 2023-11-13 ENCOUNTER — Encounter

## 2023-11-13 ENCOUNTER — Ambulatory Visit: Admitting: Physician Assistant

## 2023-11-13 VITALS — BP 112/70 | HR 74 | Ht 59.0 in | Wt 198.0 lb

## 2023-11-13 DIAGNOSIS — R42 Dizziness and giddiness: Secondary | ICD-10-CM

## 2023-11-13 DIAGNOSIS — I5032 Chronic diastolic (congestive) heart failure: Secondary | ICD-10-CM | POA: Diagnosis not present

## 2023-11-13 DIAGNOSIS — I10 Essential (primary) hypertension: Secondary | ICD-10-CM

## 2023-11-13 DIAGNOSIS — E785 Hyperlipidemia, unspecified: Secondary | ICD-10-CM

## 2023-11-13 DIAGNOSIS — R079 Chest pain, unspecified: Secondary | ICD-10-CM | POA: Diagnosis not present

## 2023-11-13 NOTE — Patient Instructions (Addendum)
 Medication Instructions:  STOP Losartan !!!!! *If you need a refill on your cardiac medications before your next appointment, please call your pharmacy*   Lab Work: No labs were ordered during today's visit.  If you have labs (blood work) drawn today and your tests are completely normal, you will receive your results only by: MyChart Message (if you have MyChart) OR A paper copy in the mail If you have any lab test that is abnormal or we need to change your treatment, we will call you to review the results.   Testing/Procedures: Your physician has requested that you have an echocardiogram. Echocardiography is a painless test that uses sound waves to create images of your heart. It provides your doctor with information about the size and shape of your heart and how well your heart's chambers and valves are working. This procedure takes approximately one hour. There are no restrictions for this procedure. Please do NOT wear cologne, perfume, aftershave, or lotions (deodorant is allowed). Please arrive 15 minutes prior to your appointment time.  Please note: We ask at that you not bring children with you during ultrasound (echo/ vascular) testing. Due to room size and safety concerns, children are not allowed in the ultrasound rooms during exams. Our front office staff cannot provide observation of children in our lobby area while testing is being conducted. An adult accompanying a patient to their appointment will only be allowed in the ultrasound room at the discretion of the ultrasound technician under special circumstances. We apologize for any inconvenience.    Follow-Up: At [facility], you and your health needs are our priority.  As part of our continuing mission to provide you with exceptional heart care, we have created designated Provider Care Teams.  These Care Teams include your primary Cardiologist (physician) and Advanced Practice Providers (APPs -  Physician Assistants and  Nurse Practitioners) who all work together to provide you with the care you need, when you need it.  We recommend signing up for the patient portal called MyChart.  Sign up information is provided on this After Visit Summary.  MyChart is used to connect with patients for Virtual Visits (Telemedicine).  Patients are able to view lab/test results, encounter notes, upcoming appointments, etc.  Non-urgent messages can be sent to your provider as well.   To learn more about what you can do with MyChart, go to [email or web address].    Your next appointment:   1 month(s)  Provider:   Maude Emmer, MD    Other Instructions Thank you for choosing [facility]!

## 2023-11-13 NOTE — Progress Notes (Signed)
 Cardiology Office Note:    Date:  11/13/2023   ID:  [name], DOB 05-Jul-1960, MRN [id number]  PCP:  [facility]   [facility] Providers Cardiologist:  Maude Emmer, MD Cardiology APP:  [name] Jon Garre, PA     Referring MD: Bernard Drivers, MD   Chief Complaint  Patient presents with   Follow-up    CP, dizziness    History of Present Illness:    [name] is a 63 y.o. female with a hx of HFpEF, OSA, remote history of PE no longer on OAC, TIA, hypertension, hyperlipidemia, breast cancer s/p lumpectomy and XRT.  She has a history of atypical chest pain.  POET in 2024 was high risk leading to [facility] 01/10/23 that showed nonobstructive CAD with 25% mid LAD, otherwise unchanged from prior [facility] in 2016.   She was in the ER yesterday 11/12/23 with 1.5 hr of chest pain unresponsive to NTG, but improved with fentanyl . CE negative, EKG nonischemic. CP worse with deep inspiration and reproducible on palpation. CTA negative for PE and dissection. She was discharged without admission.   She presents today for cardiology follow up.She is in a wheelchair due to ongoing dizziness. She states she was playing bingo and finished and got up and felt dizzy. She had her BP checked and also reported CP. EMS was dispatched. She still has 6/10 chest pain. CP is substernal and radiates to her back, has been constant. No SOB today.  CP worse with deep breath. Dizziness description sound orthostatic. She was taken off mounjaro  and was just switched to extended release metformin .   She also states she had some pain on the right side of her chest 2 days prior to ER visit and she did an extra lymphedema treatment (from prior breast cancer).   Past Medical History:  Diagnosis Date   Allergy    Anginal pain (HCC)    a. NL cath in 2008;  b. Myoview  03/2011: dec uptake along mid anterior wall on stress imaging -> ? attenuation vs. ischemia, EF 65%;  c. 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 2008 -> coumadin x 6 mos.   Rheumatoid arthritis (HCC)    Sleep apnea    on CPAP 02/2018   SOB (shortness of breath)    Thyroid  disease    TIA (transient ischemic attack)    Urinary urgency    Vitamin D  deficiency     Past  Surgical History:  Procedure Laterality Date   ABDOMINAL HYSTERECTOMY  2005   APPENDECTOMY     BREAST LUMPECTOMY WITH RADIOACTIVE SEED AND SENTINEL LYMPH NODE BIOPSY Right 11/16/2021   Procedure: RIGHT BREAST RADIOACTIVE SEED LOCALIZED LUMPECTOMY WITH RIGHT AXILLARY SENTINEL LYMPH NODE BIOPSY;  Surgeon: Belinda Cough, MD;  Location: [facility];  Service: General;  Laterality: Right;  LMA & PEC BLOCK   CARDIAC CATHETERIZATION     Normal   CARDIAC CATHETERIZATION N/A 09/30/2014   Procedure: Left Heart Cath and Coronary Angiography;  Surgeon: Ozell Fell, MD;  Location: [facility] INVASIVE CV LAB;  Service: Cardiovascular;  Laterality: N/A;   LAPAROSCOPIC APPENDECTOMY N/A 06/03/2012   Procedure: APPENDECTOMY LAPAROSCOPIC;  Surgeon: Jina Nephew, MD;  Location: [facility] OR;  Service: General;  Laterality: N/A;   Left knee surgery  2008   LEG SURGERY     RIGHT/LEFT HEART CATH AND CORONARY ANGIOGRAPHY N/A 01/10/2023   Procedure: RIGHT/LEFT HEART CATH AND CORONARY  ANGIOGRAPHY;  Surgeon: Wendel Lurena POUR, MD;  Location: [facility] INVASIVE CV LAB;  Service: Cardiovascular;  Laterality: N/A;   TONSILLECTOMY     TOTAL HIP ARTHROPLASTY Left 06/11/2018   Procedure: LEFT TOTAL HIP ARTHROPLASTY ANTERIOR APPROACH;  Surgeon: Addie Cordella Hamilton, MD;  Location: [facility] OR;  Service: Orthopedics;  Laterality: Left;   TOTAL KNEE ARTHROPLASTY Left 08/22/2016   Procedure: TOTAL KNEE ARTHROPLASTY;  Surgeon: Addie Hamilton Cordella, MD;  Location: [facility] OR;  Service: Orthopedics;  Laterality: Left;   TUBAL LIGATION  1989    Current Medications: Current Meds  Medication Sig   ACCU-CHEK GUIDE test strip    acetaminophen  (TYLENOL ) 500 MG tablet Take 1,000 mg by mouth 2 (two) times daily as needed for moderate pain.   albuterol  (PROVENTIL  HFA;VENTOLIN  HFA) 108 (90 Base) MCG/ACT inhaler Inhale 2 puffs into the lungs every 6 (six) hours as needed for wheezing or shortness of breath.   albuterol  (VENTOLIN  HFA) 108 (90 Base) MCG/ACT inhaler Inhale 1-2 puffs into the lungs every 6 (six) hours as needed for wheezing or shortness of breath.   aspirin  81 MG chewable tablet Chew 81 mg by mouth daily.   atorvastatin  (LIPITOR) 40 MG tablet Take 40 mg by mouth at bedtime.    BIOTIN PO Take 1 tablet by mouth at bedtime.   busPIRone  (BUSPAR ) 30 MG tablet Take 30 mg by mouth 2 (two) times daily.   carvedilol  (COREG ) 3.125 MG tablet Take 1 tablet (3.125 mg total) by mouth 2 (two) times daily.   clindamycin  (CLEOCIN ) 150 MG capsule TAKE 4 CAPSULES 1 HOUR PRIOR TO PROCEDURE   cyclobenzaprine  (FLEXERIL ) 10 MG tablet Take 1 tablet (10 mg total) by mouth 2 (two) times daily as needed for muscle spasms.   diclofenac  Sodium (VOLTAREN ) 1 % GEL Apply 2 g topically 4 (four) times daily as needed (joint/muscle pain).   empagliflozin  (JARDIANCE ) 10 MG TABS tablet Take 1 tablet (10 mg total) by mouth daily.   EPINEPHrine  0.3 mg/0.3 mL IJ SOAJ injection Inject 0.3 mg into the muscle as needed for anaphylaxis. Follow  package instructions as needed for severe allergy or anaphylactic reaction.   fluticasone  furoate-vilanterol (BREO ELLIPTA ) 100-25 MCG/ACT AEPB Inhale 1 puff into the lungs daily.   furosemide  (LASIX ) 40 MG tablet Take 1 tablet (40 mg total) by mouth daily as needed for edema or fluid (take in case of weight gain 2 to 3 lbs in 24 hrs or 5 lbs in 7 days.).   hydrocortisone 2.5 % cream SMARTSIG:sparingly Topical  Twice Daily PRN   hydrOXYzine  (VISTARIL ) 25 MG capsule Take 25 mg by mouth 3 (three) times daily as needed for itching.   ipratropium-albuterol  (DUONEB) 0.5-2.5 (3) MG/3ML SOLN Take 3 mLs by nebulization every 6 (six) hours as needed (shortness of breath).   KLOR-CON  M10 10 MEQ tablet Take 10 mEq by mouth 2 (two) times daily.   loperamide  (IMODIUM ) 2 MG capsule Take 1 capsule (2 mg total) by mouth 4 (four) times daily as needed for diarrhea or loose stools.   meloxicam  (MOBIC ) 7.5 MG tablet    metFORMIN  (GLUCOPHAGE -XR) 500 MG 24 hr tablet Take 1 tablet (500 mg total) by mouth daily with breakfast.   methylPREDNISolone  (MEDROL  DOSEPAK) 4 MG TBPK tablet Take as directed   nitroGLYCERIN  (NITROSTAT ) 0.3 MG SL tablet Place 0.3 mg under the tongue every 5 (five) minutes as needed for chest pain.   nystatin (MYCOSTATIN/NYSTOP) powder Apply 1 application topically 3 (three) times daily as needed for rash.   ondansetron  (ZOFRAN -ODT) 4 MG disintegrating tablet Take 1 tablet (4 mg total) by mouth every 8 (eight) hours as needed for nausea or vomiting.   oxyCODONE  (OXY IR/ROXICODONE ) 5 MG immediate release tablet Take 1 tablet by mouth every 6 (six) hours as needed.   pantoprazole  (PROTONIX ) 40 MG tablet Take 40 mg by mouth daily.   sertraline  (ZOLOFT ) 25 MG tablet Take 1 tablet (25 mg total) by mouth daily.   spironolactone  (ALDACTONE ) 25 MG tablet Take 25 mg by mouth every other day.   triamcinolone  ointment (KENALOG ) 0.1 % APPLY A THIN LAYER TO THE AFFECTED AREA(S) ON LEFT ARM BY TOPICAL ROUTE 2 TIMES  PER DAY FOR 7 DAYS   Vitamin D , Ergocalciferol , (DRISDOL ) 1.25 MG (50000 UNIT) CAPS capsule Take 1 capsule (50,000 Units total) by mouth every 7 (seven) days.   [DISCONTINUED] losartan  (COZAAR ) 25 MG tablet Take 1 tablet (25 mg total) by mouth daily.     Allergies:   Shellfish allergy, Hydrocodone -acetaminophen , Penicillins, Gabapentin , Latex, and Sulfa antibiotics   Social History   Socioeconomic History   Marital status: Divorced    Spouse name: Not on file   Number of children: 3   Years of education: Not on file   Highest education level: Not on file  Occupational History   Occupation: UNEMPLOYED    Employer: DISABLED  Tobacco Use   Smoking status: Former    Current packs/day: 0.00    Average packs/day: 0.5 packs/day for 15.0 years (7.5 ttl pk-yrs)    Types: Cigarettes    Start date: 04/24/1970    Quit date: 04/24/1985    Years since quitting: 38.5   Smokeless tobacco: Never  Vaping Use   Vaping status: Never Used  Substance and Sexual Activity   Alcohol use: No    Alcohol/week: 0.0 standard drinks of alcohol   Drug use: Not Currently    Types: Marijuana   Sexual activity: Not Currently    Birth control/protection: Surgical    Comment: hyst  Other Topics Concern   Not on file  Social History Narrative   Lives in [place] by herself.  Disabled.  Occasional Tea .  3 kids, 6 grandkids.     Water aerobics 3 times a week.   Social Drivers of Health   Financial Resource Strain: High Risk (03/27/2022)   Overall Financial Resource Strain (CARDIA)    Difficulty of Paying Living Expenses: Very hard  Food Insecurity: Food Insecurity Present (03/27/2022)   Hunger Vital Sign    Worried About  Running Out of Food in the Last Year: Sometimes true    Ran Out of Food in the Last Year: Sometimes true  Transportation Needs: No Transportation Needs (03/27/2022)   PRAPARE - Administrator, Civil Service (Medical): No    Lack of Transportation (Non-Medical): No  Physical Activity:  Not on file  Stress: Not on file  Social Connections: Not on file     Family History: The patient's family history includes Alcoholism in her father and mother; Anxiety disorder in her mother; Arthritis in her mother; Asthma in her brother and daughter; Bipolar disorder in her father; Breast cancer in her cousin; Breast cancer (age of onset: 89 - 31) in her maternal aunt; Colon cancer in her maternal grandfather; Depression in her father and mother; Diabetes in her father, mother, and sister; Drug abuse in her father; Emphysema in her mother; Heart disease in her father, mother, and paternal grandfather; Heart failure in her mother; Hyperlipidemia in her father and mother; Hypertension in her father and mother; Sleep apnea in her father; Stroke in her father and mother; Sudden death in her father. There is no history of Esophageal cancer, Stomach cancer, or Rectal cancer.  ROS:   Please see the history of present illness.     All other systems reviewed and are negative.  EKGs/Labs/Other Studies Reviewed:    The following studies were reviewed today:  EKG Interpretation Date/Time:  Tuesday November 13 2023 16:00:34 EDT Ventricular Rate:  68 PR Interval:  192 QRS Duration:  118 QT Interval:  394 QTC Calculation: 418 R Axis:   21  Text Interpretation: Normal sinus rhythm Septal infarct , age undetermined When compared with ECG of 12-Nov-2023 18:57, PREVIOUS ECG IS PRESENT Confirmed by [name] Slough ([id number]) on 11/13/2023 4:02:27 PM    Recent Labs: 07/25/2023: B Natriuretic Peptide 16.6 10/09/2023: ALT 23; Magnesium  2.1 11/12/2023: BUN 18; Creatinine, Ser 1.04; Hemoglobin 13.0; Platelets 195; Potassium 3.8; Sodium 139  Recent Lipid Panel    Component Value Date/Time   CHOL 127 07/16/2022 0145   CHOL 127 09/28/2021 0951   TRIG 119 07/16/2022 0145   HDL 48 07/16/2022 0145   HDL 48 09/28/2021 0951   CHOLHDL 2.6 07/16/2022 0145   VLDL 24 07/16/2022 0145   LDLCALC 55 07/16/2022 0145   LDLCALC  60 09/28/2021 0951     Risk Assessment/Calculations:                Physical Exam:    VS:  BP 112/70   Pulse 74   Ht 4' 11 (1.499 m)   Wt 198 lb (89.8 kg)   SpO2 96%   BMI 39.99 kg/m     Wt Readings from Last 3 Encounters:  11/13/23 198 lb (89.8 kg)  11/07/23 198 lb (89.8 kg)  10/09/23 197 lb (89.4 kg)     GEN:  Well nourished, well developed in no acute distress HEENT: Normal NECK: No JVD; No carotid bruits LYMPHATICS: No lymphadenopathy CARDIAC: RRR, no murmurs, rubs, gallops RESPIRATORY:  Clear to auscultation without rales, wheezing or rhonchi  ABDOMEN: Soft, non-tender, non-distended MUSCULOSKELETAL:  No edema; No deformity  SKIN: Warm and dry NEUROLOGIC:  Alert and oriented x 3 PSYCHIATRIC:  Normal affect   ASSESSMENT:    1. Chest pain of uncertain etiology   2. Dizziness   3. Chronic diastolic congestive heart failure (HCC)   4. Essential hypertension   5. Hyperlipidemia with target LDL less than 70   6. Morbid obesity (HCC)  PLAN:    In order of problems listed above:  Chest pain - Ruled out with negative cardiac enzymes in the ER - Atypical description with pleuritic type chest pain with deep inspiration and tenderness to palpation today - Reassuring heart catheterization 2024 -- repeat EKG today reassuring - will repeat echocardiogram   Dizziness - sounds orthostatic in nature and BP marginal today - will stop losartan  (she doesn't think she can cut them in half) - she only takes spironolactone  every other day because she can't cut it in half - gave ER precautions - she confirms she is not driving   HFpEF Last echocardiogram 11/2022 showed a low normal LVEF 50-55% with trivial MR - Carvedilol  reduced to 3.125 mg twice daily on last visit due to dizziness with exercise -Otherwise GDMT with 25 mg spironolactone  every other day, 10 mg Jardiance , 40 mg Lasix  PRN - stopping losartan  as above - Continue low-sodium  diet   Hypertension - Managed in the context of HFpEF   Hyperlipidemia LDL goal less than 70 - Continue aspirin  and 40 mg Lipitor   Nonobstructive CAD - By heart catheterization 2024 - Continue respective medication   Obesity -  allergic reaction to Mounjaro    Follow up in Sept after echo. Gave ER precautions.            Medication Adjustments/Labs and Tests Ordered: Current medicines are reviewed at length with the patient today.  Concerns regarding medicines are outlined above.  Orders Placed This Encounter  Procedures   EKG 12-Lead   ECHOCARDIOGRAM COMPLETE   No orders of the defined types were placed in this encounter.   Patient Instructions  Medication Instructions:  STOP Losartan !!!!! *If you need a refill on your cardiac medications before your next appointment, please call your pharmacy*   Lab Work: No labs were ordered during today's visit.  If you have labs (blood work) drawn today and your tests are completely normal, you will receive your results only by: MyChart Message (if you have MyChart) OR A paper copy in the mail If you have any lab test that is abnormal or we need to change your treatment, we will call you to review the results.   Testing/Procedures: Your physician has requested that you have an echocardiogram. Echocardiography is a painless test that uses sound waves to create images of your heart. It provides your doctor with information about the size and shape of your heart and how well your heart's chambers and valves are working. This procedure takes approximately one hour. There are no restrictions for this procedure. Please do NOT wear cologne, perfume, aftershave, or lotions (deodorant is allowed). Please arrive 15 minutes prior to your appointment time.  Please note: We ask at that you not bring children with you during ultrasound (echo/ vascular) testing. Due to room size and safety concerns, children are not allowed in the  ultrasound rooms during exams. Our front office staff cannot provide observation of children in our lobby area while testing is being conducted. An adult accompanying a patient to their appointment will only be allowed in the ultrasound room at the discretion of the ultrasound technician under special circumstances. We apologize for any inconvenience.    Follow-Up: At [facility], you and your health needs are our priority.  As part of our continuing mission to provide you with exceptional heart care, we have created designated Provider Care Teams.  These Care Teams include your primary Cardiologist (physician) and Advanced Practice Providers (APPs -  Physician Assistants and Nurse Practitioners) who all work together to provide you with the care you need, when you need it.  We recommend signing up for the patient portal called MyChart.  Sign up information is provided on this After Visit Summary.  MyChart is used to connect with patients for Virtual Visits (Telemedicine).  Patients are able to view lab/test results, encounter notes, upcoming appointments, etc.  Non-urgent messages can be sent to your provider as well.   To learn more about what you can do with MyChart, go to [email or web address].    Your next appointment:   1 month(s)  Provider:   Maude Emmer, MD    Other Instructions Thank you for choosing [facility]!       Signed, Erline Siddoway, PA  11/13/2023 4:14 PM    [facility]

## 2023-11-22 ENCOUNTER — Encounter (INDEPENDENT_AMBULATORY_CARE_PROVIDER_SITE_OTHER): Payer: Self-pay | Admitting: Adult Health

## 2023-11-22 ENCOUNTER — Ambulatory Visit (INDEPENDENT_AMBULATORY_CARE_PROVIDER_SITE_OTHER): Admitting: Adult Health

## 2023-11-22 VITALS — BP 117/70 | HR 65 | Temp 97.6°F | Ht 59.0 in | Wt 200.0 lb

## 2023-11-22 DIAGNOSIS — Z7984 Long term (current) use of oral hypoglycemic drugs: Secondary | ICD-10-CM

## 2023-11-22 DIAGNOSIS — Z6841 Body Mass Index (BMI) 40.0 and over, adult: Secondary | ICD-10-CM

## 2023-11-22 DIAGNOSIS — E559 Vitamin D deficiency, unspecified: Secondary | ICD-10-CM | POA: Diagnosis not present

## 2023-11-22 DIAGNOSIS — R079 Chest pain, unspecified: Secondary | ICD-10-CM

## 2023-11-22 DIAGNOSIS — E669 Obesity, unspecified: Secondary | ICD-10-CM | POA: Diagnosis not present

## 2023-11-22 DIAGNOSIS — R0602 Shortness of breath: Secondary | ICD-10-CM

## 2023-11-22 DIAGNOSIS — E1165 Type 2 diabetes mellitus with hyperglycemia: Secondary | ICD-10-CM

## 2023-11-22 MED ORDER — METFORMIN HCL ER 500 MG PO TB24: 500.0000 mg | ORAL_TABLET | Freq: Every day | ORAL | 0 refills | Status: DC

## 2023-11-22 MED ORDER — VITAMIN D (ERGOCALCIFEROL) 1.25 MG (50000 UNIT) PO CAPS: 50000.0000 [IU] | ORAL_CAPSULE | ORAL | 0 refills | Status: DC

## 2023-11-22 NOTE — Progress Notes (Signed)
 WEIGHT SUMMARY AND BIOMETRICS  Vitals Temp: 97.6 F (36.4 C) BP: 117/70 Pulse Rate: 65 SpO2: 98 %   Anthropometric Measurements Height: 4' 11 (1.499 m) Weight: 200 lb (90.7 kg) BMI (Calculated): 40.37 Weight at Last Visit: 197 lb Weight Lost Since Last Visit: 0 Weight Gained Since Last Visit: 3 lb Starting Weight: 209 lb Total Weight Loss (lbs): 9 lb (4.082 kg)   Body Composition  Body Fat %: 48.3 % Fat Mass (lbs): 96.8 lbs Muscle Mass (lbs): 98.6 lbs Total Body Water (lbs): 75.8 lbs Visceral Fat Rating : 16   Other Clinical Data RMR: 1354 Fasting: yes Labs: yes Today's Visit #: 27 Starting Date: 06/01/21 Comments: CAT#2    Chief Complaint:   OBESITY [name] is here to discuss her progress with her obesity treatment plan.  She is on the the Category 2 Plan and states she is following her eating plan approximately 75 % of the time.  She states she is exercising Water Walking 45-60 minutes 3 times per week.  Interim History:  11/12/2023 ED Encounter for Chest Pain 11/13/2023 Cards F/U -stopped Losartan  Reviewed all notes, labs, imaging  Stress- she endorses anxiety and worsening mood due to toxic work environment. 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 [name] feels that her client is prejudiced against her. [name] can rely on local friends and one her children for emotional support. She has an established therapist at [facility]- RECOMMEND resuming therapy with that provider.  [name] endorses the following challenges with weight loss: -Planning meals for week -Making a grocery list for planned meals -Cooking planned meals - Skipping breakfast due to time - Workplace temptations -Cravings -Eating out on weekends -Not priorititzing   Subjective:   1. Chest pain, unspecified type 11/13/2023 Cardiology OV Notes:  History of Present Illness:     [name] is  a 63 y.o. female with a hx of HFpEF, OSA, remote history of PE no longer on OAC, TIA, hypertension, hyperlipidemia, breast cancer s/p lumpectomy and XRT.  She has a history of atypical chest pain.  POET in 2024 was high risk leading to [facility] 01/10/23 that showed nonobstructive CAD with 25% mid LAD, otherwise unchanged from prior [facility] in 2016.    She was in the ER yesterday 11/12/23 with 1.5 hr of chest pain unresponsive to NTG, but improved with fentanyl . CE negative, EKG nonischemic. CP worse with deep inspiration and reproducible on palpation. CTA negative for PE and dissection. She was discharged without admission.    She presents today for cardiology follow up.She is in a wheelchair due to ongoing dizziness. She states she was playing bingo and finished and got up and felt dizzy. She had her BP checked and also reported CP. EMS was dispatched. She still has 6/10 chest pain. CP is substernal and radiates to her back, has been constant. No SOB today.  CP worse with deep breath. Dizziness description sound orthostatic. She was taken off mounjaro  and was just switched to extended release metformin .    She also states she had some pain on the right side of her chest 2 days prior to ER visit and she did an extra lymphedema treatment (from prior breast cancer).   2. SOBOE (shortness of breath on exertion) She endorses dyspnea with extreme exertion, denies CP  06/01/21  RMR 1555    11/22/23 08:00  RMR 1354   Metabolic rate  decreased and below anticipated RMR  3. Vitamin D  deficiency  Latest Reference Range & Units 10/09/23 08:28  Vitamin D , 25-Hydroxy 30.0 - 100.0 ng/mL 46.6   She is on weekly Ergocalciferol - denies N/V/Muscle Weakness  4. Type 2 diabetes mellitus with hyperglycemia, without long-term current use of insulin  [facility]) Lab Results  Component Value Date   HGBA1C 6.5 (H) 10/09/2023   HGBA1C 7.1 (H) 05/07/2023   HGBA1C 7.9 (H) 12/11/2022    11/07/2023 Mounjaro  stopped due to injection  site reactions. Metformin  500mg  1/2 tab increased to full tap and converted from plain to XR formulation She is tolerating Metformin  XR 500mg  daily  Assessment/Plan:   1. Chest pain, unspecified type Monitor for sx's F/u with Cards/PCP as directed  2. SOBOE (shortness of breath on exertion) (Primary) Convert from Cat 2 to Cat 1 to account for decreased metabolic rate  3. Vitamin D  deficiency Refill - Vitamin D , Ergocalciferol , (DRISDOL ) 1.25 MG (50000 UNIT) CAPS capsule; Take 1 capsule (50,000 Units total) by mouth every 7 (seven) days.  Dispense: 4 capsule; Refill: 0  4. Type 2 diabetes mellitus with hyperglycemia, without long-term current use of insulin  (HCC) Refill metFORMIN  (GLUCOPHAGE -XR) 500 MG 24 hr tablet Take 1 tablet (500 mg total) by mouth daily with breakfast. Dispense: 30 tablet, Refills: 0 ordered   5. BMI 40.0-44.9, adult (HCC) Current BMI 40.5  [name] is currently in the action stage of change. As such, her goal is to continue with weight loss efforts. She has agreed to the Category 1 Plan.   Exercise goals: All adults should avoid inactivity. Some physical activity is better than none, and adults who participate in any amount of physical activity gain some health benefits. Adults should also include muscle-strengthening activities that involve all major muscle groups on 2 or more days a week. Water Walking  Behavioral modification strategies: increasing lean protein intake, decreasing simple carbohydrates, increasing vegetables, increasing water intake, meal planning and cooking strategies, keeping healthy foods in the home, ways to avoid boredom eating, ways to avoid night time snacking, emotional eating strategies, and planning for success.  [name] has agreed to follow-up with our clinic in 4 weeks. She was informed of the importance of frequent follow-up visits to maximize her success with intensive lifestyle modifications for her multiple health conditions.    Objective:   Blood pressure 117/70, pulse 65, temperature 97.6 F (36.4 C), height 4' 11 (1.499 m), weight 200 lb (90.7 kg), SpO2 98%. Body mass index is 40.4 kg/m.  General: Cooperative, alert, well developed, in no acute distress. HEENT: Conjunctivae and lids unremarkable. Cardiovascular: Regular rhythm.  Lungs: Normal work of breathing. Neurologic: No focal deficits.   Lab Results  Component Value Date   CREATININE 1.04 (H) 11/12/2023   BUN 18 11/12/2023   NA 139 11/12/2023   K 3.8 11/12/2023   CL 107 11/12/2023   CO2 24 11/12/2023   Lab Results  Component Value Date   ALT 23 10/09/2023   AST 16 10/09/2023   ALKPHOS 64 10/09/2023   BILITOT 0.3 10/09/2023   Lab Results  Component Value Date   HGBA1C 6.5 (H) 10/09/2023   HGBA1C 7.1 (H) 05/07/2023   HGBA1C 7.9 (H) 12/11/2022   HGBA1C 7.3 (H) 07/15/2022   HGBA1C 6.4 (H) 09/28/2021   Lab Results  Component Value Date   INSULIN  19.1 10/09/2023   INSULIN  20.2 06/05/2023   INSULIN  13.4 08/14/2022   INSULIN  22.1 09/28/2021   INSULIN  26.8 (H) 06/01/2021   Lab Results  Component Value Date   TSH 0.746 11/07/2022   Lab Results  Component Value Date   CHOL 127 07/16/2022   HDL 48 07/16/2022   LDLCALC 55 07/16/2022   TRIG 119 07/16/2022   CHOLHDL 2.6 07/16/2022   Lab Results  Component Value Date   VD25OH 46.6 10/09/2023   VD25OH 51.0 05/07/2023   VD25OH 33.5 01/11/2023   Lab Results  Component Value Date   WBC 5.3 11/12/2023   HGB 13.0 11/12/2023   HCT 40.3 11/12/2023   MCV 92.6 11/12/2023   PLT 195 11/12/2023   No results found for: IRON, TIBC, FERRITIN  Attestation Statements:   Reviewed by clinician on day of visit: allergies, medications, problem list, medical history, surgical history, family history, social history, and previous encounter notes.  I have reviewed the above documentation for accuracy and completeness, and I agree with the above. -  Marks Scalera d. Damein Gaunce, NP-C

## 2023-11-26 ENCOUNTER — Encounter: Payer: Self-pay | Admitting: Podiatry

## 2023-11-26 ENCOUNTER — Ambulatory Visit (INDEPENDENT_AMBULATORY_CARE_PROVIDER_SITE_OTHER)

## 2023-11-26 ENCOUNTER — Ambulatory Visit (INDEPENDENT_AMBULATORY_CARE_PROVIDER_SITE_OTHER): Admitting: Podiatry

## 2023-11-26 VITALS — Ht 59.0 in | Wt 200.0 lb

## 2023-11-26 DIAGNOSIS — M722 Plantar fascial fibromatosis: Secondary | ICD-10-CM

## 2023-11-27 NOTE — Progress Notes (Signed)
 Chief Complaint  Patient presents with   Fracture    Pt is here due to left great toe fracture on 7/30 states bed rail dropped on toe was seen at a different clinic and was told she had a fracture, now she states the toe is fine just concerned with toenail that is broken off.    HPI: 63 y.o. female presenting today for evaluation of an injury to the left hallux nail plate.  She reports that originally she was informed that she had a toe fracture however they called her later and told her that there was no fracture of the toe.  She says that the portion of her nail broke off.  Overall it is feeling significantly better since the time of injury.  Past Medical History:  Diagnosis Date   Allergy    Anginal pain (HCC)    a. NL cath in 2008;  b. Myoview  03/2011: dec uptake along mid anterior wall on stress imaging -> ? attenuation vs. ischemia, EF 65%;  c. 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 2008 -> coumadin x 6 mos.   Rheumatoid arthritis (HCC)    Sleep apnea    on CPAP 02/2018   SOB (shortness of breath)    Thyroid  disease    TIA (transient ischemic attack)    Urinary urgency    Vitamin D  deficiency     Past Surgical History:  Procedure Laterality Date   ABDOMINAL HYSTERECTOMY  2005    APPENDECTOMY     BREAST LUMPECTOMY WITH RADIOACTIVE SEED AND SENTINEL LYMPH NODE BIOPSY Right 11/16/2021   Procedure: RIGHT BREAST RADIOACTIVE SEED LOCALIZED LUMPECTOMY WITH RIGHT AXILLARY SENTINEL LYMPH NODE BIOPSY;  Surgeon: Belinda Cough, MD;  Location: [facility];  Service: General;  Laterality: Right;  LMA & PEC BLOCK   CARDIAC CATHETERIZATION     Normal   CARDIAC CATHETERIZATION N/A 09/30/2014   Procedure: Left Heart Cath and Coronary Angiography;  Surgeon: Ozell Fell, MD;  Location: [facility] INVASIVE CV LAB;  Service: Cardiovascular;  Laterality: N/A;   LAPAROSCOPIC APPENDECTOMY N/A 06/03/2012   Procedure: APPENDECTOMY LAPAROSCOPIC;  Surgeon: Jina Nephew, MD;  Location: [facility] OR;  Service: General;  Laterality: N/A;   Left knee surgery  2008   LEG SURGERY     RIGHT/LEFT HEART CATH AND CORONARY ANGIOGRAPHY N/A 01/10/2023   Procedure: RIGHT/LEFT HEART CATH AND CORONARY ANGIOGRAPHY;  Surgeon: Wendel Lurena POUR, MD;  Location: [facility] INVASIVE CV LAB;  Service: Cardiovascular;  Laterality: N/A;   TONSILLECTOMY     TOTAL HIP ARTHROPLASTY Left 06/11/2018   Procedure: LEFT TOTAL HIP ARTHROPLASTY ANTERIOR APPROACH;  Surgeon: Addie Cordella Hamilton, MD;  Location: [facility] OR;  Service: Orthopedics;  Laterality: Left;   TOTAL KNEE ARTHROPLASTY Left 08/22/2016   Procedure: TOTAL KNEE ARTHROPLASTY;  Surgeon: Addie Hamilton Cordella, MD;  Location: [facility] OR;  Service: Orthopedics;  Laterality: Left;   TUBAL LIGATION  1989    Allergies  Allergen Reactions   Shellfish Allergy Hives and Anaphylaxis   Hydrocodone -Acetaminophen  Itching and Other (See Comments)    confusion Mental Status Changes   Penicillins Swelling and Other (See Comments)   Gabapentin      Not available   Latex Rash   Sulfa Antibiotics Diarrhea, Itching and Rash     Physical Exam: General: The patient is alert and oriented x3 in no acute distress.  Dermatology: Skin is warm, dry and supple bilateral lower extremities.  A portion of the  left hallux nail plate has been avulsed but there is no open wound.  It appears stable.  No clinical indication of infection  Vascular: Palpable pedal pulses bilaterally. Capillary refill within normal limits.  No appreciable edema.  No erythema.  Neurological: Grossly intact via light touch  Musculoskeletal Exam: No pedal deformities noted  Radiographic Exam LT foot 11/26/2023:  Normal osseous mineralization. Joint spaces preserved.  No fractures or osseous irregularities noted.  Impression: Negative for fracture  Assessment/Plan of Care: 1.  Contusion with partial loss of nail plate left hallux  -Patient evaluated.  X-rays reviewed -She is currently walking just fine in regular tennis shoes.  Continue -OTC Motrin  PRN -Return to clinic as needed       Thresa EMERSON Sar, DPM [facility]  Dr. Thresa EMERSON Sar, DPM    [address] [place], [place] [address]                Office [phone]  Fax [phone]

## 2023-12-03 ENCOUNTER — Ambulatory Visit: Admitting: Internal Medicine

## 2023-12-03 DIAGNOSIS — R0602 Shortness of breath: Secondary | ICD-10-CM

## 2023-12-03 LAB — PULMONARY FUNCTION TEST
DL/VA % pred: 137 %
DL/VA: 5.99 ml/min/mmHg/L
DLCO cor % pred: 71 %
DLCO cor: 11.98 ml/min/mmHg
DLCO unc % pred: 70 %
DLCO unc: 11.83 ml/min/mmHg
FEF 25-75 Post: 0.58 L/s
FEF 25-75 Pre: 1.12 L/s
FEF2575-%Change-Post: -48 %
FEF2575-%Pred-Post: 30 %
FEF2575-%Pred-Pre: 59 %
FEV1-%Change-Post: -11 %
FEV1-%Pred-Post: 53 %
FEV1-%Pred-Pre: 60 %
FEV1-Post: 1.07 L
FEV1-Pre: 1.21 L
FEV1FVC-%Change-Post: 1 %
FEV1FVC-%Pred-Pre: 102 %
FEV6-%Change-Post: -12 %
FEV6-%Pred-Post: 53 %
FEV6-%Pred-Pre: 61 %
FEV6-Post: 1.33 L
FEV6-Pre: 1.52 L
FEV6FVC-%Pred-Post: 104 %
FEV6FVC-%Pred-Pre: 104 %
FVC-%Change-Post: -12 %
FVC-%Pred-Post: 51 %
FVC-%Pred-Pre: 58 %
FVC-Post: 1.33 L
FVC-Pre: 1.52 L
Post FEV1/FVC ratio: 81 %
Post FEV6/FVC ratio: 100 %
Pre FEV1/FVC ratio: 79 %
Pre FEV6/FVC Ratio: 100 %
RV % pred: 80 %
RV: 1.46 L
TLC % pred: 67 %
TLC: 2.9 L

## 2023-12-03 NOTE — Patient Instructions (Signed)
 Full PFT performed today.

## 2023-12-03 NOTE — Progress Notes (Signed)
 Full PFT performed today. Patient had great difficulty performing DLCO.

## 2023-12-04 ENCOUNTER — Ambulatory Visit: Payer: 59 | Admitting: Internal Medicine

## 2023-12-04 ENCOUNTER — Other Ambulatory Visit (INDEPENDENT_AMBULATORY_CARE_PROVIDER_SITE_OTHER): Payer: Self-pay | Admitting: Adult Health

## 2023-12-12 ENCOUNTER — Ambulatory Visit: Admitting: Neurology

## 2023-12-13 ENCOUNTER — Ambulatory Visit (INDEPENDENT_AMBULATORY_CARE_PROVIDER_SITE_OTHER): Admitting: Neurology

## 2023-12-13 ENCOUNTER — Encounter: Payer: Self-pay | Admitting: Neurology

## 2023-12-13 VITALS — BP 112/74 | HR 72 | Ht <= 58 in | Wt 207.0 lb

## 2023-12-13 DIAGNOSIS — G4733 Obstructive sleep apnea (adult) (pediatric): Secondary | ICD-10-CM

## 2023-12-13 DIAGNOSIS — Z6841 Body Mass Index (BMI) 40.0 and over, adult: Secondary | ICD-10-CM

## 2023-12-13 DIAGNOSIS — E1165 Type 2 diabetes mellitus with hyperglycemia: Secondary | ICD-10-CM | POA: Diagnosis not present

## 2023-12-13 DIAGNOSIS — R0602 Shortness of breath: Secondary | ICD-10-CM | POA: Diagnosis not present

## 2023-12-13 DIAGNOSIS — E66813 Obesity, class 3: Secondary | ICD-10-CM | POA: Diagnosis not present

## 2023-12-13 DIAGNOSIS — Z7984 Long term (current) use of oral hypoglycemic drugs: Secondary | ICD-10-CM

## 2023-12-13 NOTE — Patient Instructions (Addendum)
 Mallampati  3 plus ,  neck circumference: 17. Nasal airflow unrestricted , TMJ is  Not evident. Retrognathia is seen !.  Cardiovascular:  Regular rate and rhythm , without  murmurs or carotid bruit, and without distended neck veins. Respiratory: Lungs are clear to auscultation. Skin:  Without evidence of edema, or rash Trunk: BMI is elevated and patient  has normal posture.   Neurologic exam : The patient is awake and alert, oriented to place and time.   Memory subjective  described as intact.  There is a normal attention span & concentration ability.  Speech is fluent without dysarthria, dysphonia or aphasia. Mood and affect are depressed . Cranial nerves: Pupils are equal and briskly reactive to light. Funduscopic exam without  evidence of pallor or edema. Extraocular movements  in vertical and horizontal planes intact , but there is coarse saccades noted with left to right gaze, and not a smooth pursuit . Visual fields by finger perimetry are intact. Hearing to finger rub intact.  Facial sensation intact to fine touch. Facial motor strength is symmetric and tongue and uvula move midline.   Motor exam:  Normal tone muscle bulk and symmetric strength in all extremities.

## 2023-12-13 NOTE — Progress Notes (Signed)
 Provider:  Dedra Gores, MD  Primary Care Physician:  [facility].    Referring Provider: Claudene Prentice DELENA Mickey., Fnp [address] [place],  [place] [address]          Chief Complaint according to patient   Patient presents with:                HISTORY OF PRESENT ILLNESS:  [name] is a 63 y.o. female patient who is here for revisit 12/13/2023 for a new CPAP.  I had the pleasure of seeing [name] and again whom I had evaluated for sleep apnea in November 2015.  She has been diagnosed with obstructive sleep apnea and started CPAP, but her machine is now 64 years old.  It has started to sputter and not reliably provide positive airway pressure to her..  She was dx by a PSG , AHI 7.9 /h mild apnea and started on CPAP.  She used 16 cm water, 3 cm EPR and had reached an AHI 0.3/h. She was recently on Ozempic  and Mounjaro , couldn't tolerate either, she started itching.  She struggles with DM and obesity and she feels better when her CPAP works well.   She is back at work for [employer] benefits, 12 hours a week.  She sleeps much better on CAP/     03-18-2014:  [name] is a 63 y.o. female  ,seen here as a referral from Dr. Larnell for a sleep medicine consultation,    Initial consultation.  [name], a right-handed 64 year old African-American female is seen here today upon referral by Dr. Larnell.  She has been on CPAP for many years,  After being diagnosed in 2011 at [facility] long sleep lab,  but had no medical follow-up regarding her OSA treatment for at least 3 years, according to her. The patient is established with a local pulmonologist and was diagnosed with obstructive sleep apnea as well as obesity hypoventilation syndrome.  She underwent arterial blood gas testing on 10-28-12 and PFTs pulmonary function tests on 08-02-12.  There is a history of congestive heart failure /diastolic failure,  diabetes mellitus II,  hypertension and hyperlipidemia.    The patient reports she feels extremely fatigued , and sleeps whenever not stimulated or physically active. First she thought it was due to depression , but she has a lot of physical factors contributing.  She goes to bed at 10.30 PM and has trouble falling asleep, she sleeps through for 3 hours before her first bathroom break,  usually has 2 nocturias.  Her CPAP helps her to sleep sounder, it has not been adjusted. [facility] is her DME. She feels sometimes the CPAP is smothering her and often she took the mask off during sleep and didn't realize it until AM.  She rises at 7 AM to go ready for work.  She has a very dry mouth in AM.  She has breakfast at home before driving to work. Caffee in AM, 12 ounces. She works from 11 to 14.oo hours . At work, she does not have to fight sleepiness as she is active and stimulated, but as soon as she leaves work and sits in her car she feels very drowsy and has an irresistible urge to sleep.  At home,  she often takes inadvertently naps. She often falls asleep in front of the TV. She may get between 8 and 12 hours of sleep in  total in a 24-hour period.  She was told during a hospital based test last month that her oxygen level is low.   I was able to obtain to compliance reports one for 30 and 1 for 90 days today the 90 day download shows that the machine was only used on 26 days and only 6 of those days over 4 hours duration. The set pressure is 17 cm with an EPR level 3 cm water the residual AHI is 2.3 the patient is a very high air leak and all residual apneas are obstructive in nature. I will discuss with her to use a smaller nasal mask and perhaps use a nasal spray to make sure that the airflow through the nose is unrestricted. [name] also has retrograde via and I believe that a full face mask can worsen this condition. I will ask her to return to the sleep lab for an overnight titration and to also see if  additional oxygen may be needed at night to treat her fatigue.      Review of Systems: Out of a complete 14 system review, the patient complains of only the following symptoms, and all other reviewed systems are negative.:   SLEEPINESS ?  How likely are you to doze in the following situations: 0 = not likely, 1 = slight chance, 2 = moderate chance, 3 = high chance  Sitting and Reading? Watching Television? Sitting inactive in a public place (theater or meeting)? Lying down in the afternoon when circumstances permit? Sitting and talking to someone? Sitting quietly after lunch without alcohol? In a car, while stopped for a few minutes in traffic? As a passenger in a car for an hour without a break?  Total = 6/ 24 22/63       Social History   Socioeconomic History   Marital status: Divorced    Spouse name: Not on file   Number of children: 3   Years of education: Not on file   Highest education level: Not on file  Occupational History   Occupation: UNEMPLOYED    Employer: DISABLED  Tobacco Use   Smoking status: Former    Current packs/day: 0.00    Average packs/day: 0.5 packs/day for 15.0 years (7.5 ttl pk-yrs)    Types: Cigarettes    Start date: 04/24/1970    Quit date: 04/24/1985    Years since quitting: 38.6   Smokeless tobacco: Never  Vaping Use   Vaping status: Never Used  Substance and Sexual Activity   Alcohol use: No    Alcohol/week: 0.0 standard drinks of alcohol   Drug use: Not Currently    Types: Marijuana   Sexual activity: Not Currently    Birth control/protection: Surgical    Comment: hyst  Other Topics Concern   Not on file  Social History Narrative   Lives in [place] by herself.  Disabled.  Occasional Tea .  3 kids, 6 grandkids.     Water aerobics 3 times a week.   Social Drivers of Corporate investment banker Strain: High Risk (03/27/2022)   Overall Financial Resource Strain (CARDIA)    Difficulty of Paying Living Expenses: Very hard  Food  Insecurity: Food Insecurity Present (03/27/2022)   Hunger Vital Sign    Worried About Running Out of Food in the Last Year: Sometimes true    Ran Out of Food in the Last Year: Sometimes true  Transportation Needs: No Transportation Needs (03/27/2022)   PRAPARE - Transportation    Lack  of Transportation (Medical): No    Lack of Transportation (Non-Medical): No  Physical Activity: Not on file  Stress: Not on file  Social Connections: Not on file    Family History  Problem Relation Age of Onset   Emphysema Mother    Arthritis Mother    Heart failure Mother        alive @ 25   Stroke Mother    Diabetes Mother    Hypertension Mother    Hyperlipidemia Mother    Depression Mother    Anxiety disorder Mother    Heart disease Mother    Alcoholism Mother    Depression Father    Heart disease Father        died @ 43's.   Stroke Father    Diabetes Father    Hyperlipidemia Father    Hypertension Father    Bipolar disorder Father    Sleep apnea Father    Alcoholism Father    Drug abuse Father    Sudden death Father    Diabetes Sister    Asthma Brother    Colon cancer Maternal Grandfather        dx after 50   Heart disease Paternal Grandfather    Breast cancer Maternal Aunt 58 - 27   Breast cancer Cousin        dx 76s   Asthma Daughter    Esophageal cancer Neg Hx    Stomach cancer Neg Hx    Rectal cancer Neg Hx     Past Medical History:  Diagnosis Date   Allergy    Anginal pain (HCC)    a. NL cath in 2008;  b. Myoview  03/2011: dec uptake along mid anterior wall on stress imaging -> ? attenuation vs. ischemia, EF 65%;  c. 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 2008 -> coumadin x 6 mos.   Rheumatoid arthritis (HCC)    Sleep apnea    on CPAP 02/2018   SOB (shortness of breath)    Thyroid  disease    TIA (transient ischemic attack)    Urinary urgency    Vitamin D  deficiency     Past Surgical History:  Procedure Laterality Date   ABDOMINAL HYSTERECTOMY  2005   APPENDECTOMY     BREAST LUMPECTOMY WITH RADIOACTIVE SEED AND SENTINEL LYMPH NODE BIOPSY Right 11/16/2021   Procedure: RIGHT BREAST RADIOACTIVE SEED LOCALIZED LUMPECTOMY WITH RIGHT AXILLARY SENTINEL LYMPH NODE BIOPSY;  Surgeon: Belinda Cough,  MD;  Location: [facility];  Service: General;  Laterality: Right;  LMA & PEC BLOCK   CARDIAC CATHETERIZATION     Normal   CARDIAC CATHETERIZATION N/A 09/30/2014   Procedure: Left Heart Cath and Coronary Angiography;  Surgeon: Ozell Fell, MD;  Location: [facility] INVASIVE CV LAB;  Service: Cardiovascular;  Laterality: N/A;   LAPAROSCOPIC APPENDECTOMY N/A 06/03/2012   Procedure: APPENDECTOMY LAPAROSCOPIC;  Surgeon: Jina Nephew, MD;  Location: [facility] OR;  Service: General;  Laterality: N/A;   Left knee surgery  2008   LEG SURGERY     RIGHT/LEFT HEART CATH AND CORONARY ANGIOGRAPHY N/A 01/10/2023   Procedure: RIGHT/LEFT HEART CATH AND CORONARY ANGIOGRAPHY;  Surgeon: Wendel Lurena POUR, MD;  Location: [facility] INVASIVE CV LAB;  Service: Cardiovascular;  Laterality: N/A;   TONSILLECTOMY     TOTAL HIP ARTHROPLASTY Left 06/11/2018   Procedure: LEFT TOTAL HIP ARTHROPLASTY ANTERIOR APPROACH;  Surgeon: Addie Cordella Hamilton, MD;  Location: [facility] OR;  Service: Orthopedics;  Laterality: Left;    TOTAL KNEE ARTHROPLASTY Left 08/22/2016   Procedure: TOTAL KNEE ARTHROPLASTY;  Surgeon: Addie Hamilton Cordella, MD;  Location: [facility] OR;  Service: Orthopedics;  Laterality: Left;   TUBAL LIGATION  1989     Current Outpatient Medications on File Prior to Visit  Medication Sig Dispense Refill   ACCU-CHEK GUIDE test strip      acetaminophen  (TYLENOL ) 500 MG tablet Take 1,000 mg by mouth 2 (two) times daily as needed for moderate pain.     albuterol  (PROVENTIL  HFA;VENTOLIN  HFA) 108 (90 Base) MCG/ACT inhaler Inhale 2 puffs into the lungs every 6 (six) hours as needed for wheezing or shortness of breath.     albuterol  (VENTOLIN  HFA) 108 (90 Base) MCG/ACT inhaler Inhale 1-2 puffs into the lungs every 6 (six) hours as needed for wheezing or shortness of breath. 1 each 0   aspirin  81 MG chewable tablet Chew 81 mg by mouth daily.     atorvastatin  (LIPITOR) 40 MG tablet Take 40 mg by mouth at bedtime.      BIOTIN PO Take 1 tablet by mouth at bedtime.     busPIRone  (BUSPAR ) 30 MG tablet Take 30 mg by mouth 2 (two) times daily.     carvedilol  (COREG ) 3.125 MG tablet Take 1 tablet (3.125 mg total) by mouth 2 (two) times daily. 180 tablet 3   clindamycin  (CLEOCIN ) 150 MG capsule TAKE 4 CAPSULES 1 HOUR PRIOR TO PROCEDURE     cyclobenzaprine  (FLEXERIL ) 10 MG tablet Take 1 tablet (10 mg total) by mouth 2 (two) times daily as needed for muscle spasms. 20 tablet 0   diclofenac  Sodium (VOLTAREN ) 1 % GEL Apply 2 g topically 4 (four) times daily as needed (joint/muscle pain).     empagliflozin  (JARDIANCE ) 10 MG TABS tablet Take 1 tablet (10 mg total) by mouth daily. 30 tablet 0   EPINEPHrine  0.3 mg/0.3 mL IJ SOAJ injection Inject 0.3 mg into the muscle as needed for anaphylaxis. Follow package instructions as needed for severe allergy or anaphylactic reaction. 2 each 3   fluticasone  furoate-vilanterol (BREO ELLIPTA ) 100-25 MCG/ACT AEPB Inhale 1 puff into the lungs daily. 1 each 5   furosemide  (LASIX ) 40 MG tablet Take 1  tablet (40 mg total) by mouth daily as needed for edema or fluid (take in case of weight gain 2 to 3 lbs in 24 hrs or 5 lbs in 7 days.). 30 tablet 0   hydrocortisone 2.5 % cream SMARTSIG:sparingly Topical Twice Daily PRN  hydrOXYzine  (VISTARIL ) 25 MG capsule Take 25 mg by mouth 3 (three) times daily as needed for itching.     ipratropium-albuterol  (DUONEB) 0.5-2.5 (3) MG/3ML SOLN Take 3 mLs by nebulization every 6 (six) hours as needed (shortness of breath).     KLOR-CON  M10 10 MEQ tablet Take 10 mEq by mouth 2 (two) times daily.     lidocaine  (LIDODERM ) 5 % APPLY 1 PATCH BY TOPICAL ROUTE ONCE DAILY (MAY WEAR UP TO 12HOURS.)     loperamide  (IMODIUM ) 2 MG capsule Take 1 capsule (2 mg total) by mouth 4 (four) times daily as needed for diarrhea or loose stools. 12 capsule 0   meloxicam  (MOBIC ) 7.5 MG tablet      metFORMIN  (GLUCOPHAGE -XR) 500 MG 24 hr tablet Take 1 tablet (500 mg total) by mouth daily with breakfast. 30 tablet 0   methylPREDNISolone  (MEDROL  DOSEPAK) 4 MG TBPK tablet Take as directed 1 each 0   nitroGLYCERIN  (NITROSTAT ) 0.3 MG SL tablet Place 0.3 mg under the tongue every 5 (five) minutes as needed for chest pain.     nystatin (MYCOSTATIN/NYSTOP) powder Apply 1 application topically 3 (three) times daily as needed for rash.     ondansetron  (ZOFRAN -ODT) 4 MG disintegrating tablet Take 1 tablet (4 mg total) by mouth every 8 (eight) hours as needed for nausea or vomiting. 20 tablet 0   oxyCODONE  (OXY IR/ROXICODONE ) 5 MG immediate release tablet Take 1 tablet by mouth every 6 (six) hours as needed.     pantoprazole  (PROTONIX ) 40 MG tablet Take 40 mg by mouth daily.     sertraline  (ZOLOFT ) 25 MG tablet Take 1 tablet (25 mg total) by mouth daily. 90 tablet 0   spironolactone  (ALDACTONE ) 25 MG tablet Take 25 mg by mouth every other day.     triamcinolone  ointment (KENALOG ) 0.1 % APPLY A THIN LAYER TO THE AFFECTED AREA(S) ON LEFT ARM BY TOPICAL ROUTE 2 TIMES PER DAY FOR 7 DAYS     Vitamin D ,  Ergocalciferol , (DRISDOL ) 1.25 MG (50000 UNIT) CAPS capsule Take 1 capsule (50,000 Units total) by mouth every 7 (seven) days. 4 capsule 0   No current facility-administered medications on file prior to visit.    Allergies  Allergen Reactions   Shellfish Allergy Hives and Anaphylaxis   Hydrocodone -Acetaminophen  Itching and Other (See Comments)    confusion Mental Status Changes   Penicillins Swelling and Other (See Comments)   Gabapentin      Not available   Latex Rash   Sulfa Antibiotics Diarrhea, Itching and Rash     DIAGNOSTIC DATA (LABS, IMAGING, TESTING) - I reviewed patient records, labs, notes, testing and imaging myself where available.  Lab Results  Component Value Date   WBC 5.3 11/12/2023   HGB 13.0 11/12/2023   HCT 40.3 11/12/2023   MCV 92.6 11/12/2023   PLT 195 11/12/2023      Component Value Date/Time   NA 139 11/12/2023 1615   NA 143 10/09/2023 0828   K 3.8 11/12/2023 1615   CL 107 11/12/2023 1615   CO2 24 11/12/2023 1615   GLUCOSE 134 (H) 11/12/2023 1615   BUN 18 11/12/2023 1615   BUN 15 10/09/2023 0828   CREATININE 1.04 (H) 11/12/2023 1615   CREATININE 0.85 03/03/2022 1250   CALCIUM  9.4 11/12/2023 1615   PROT 6.3 10/09/2023 0828   ALBUMIN 4.2 10/09/2023 0828   AST 16 10/09/2023 0828   AST 14 (L) 03/03/2022 1250   ALT 23 10/09/2023 0828   ALT 18 03/03/2022  1250   ALKPHOS 64 10/09/2023 0828   BILITOT 0.3 10/09/2023 0828   BILITOT 0.5 03/03/2022 1250   GFRNONAA >60 11/12/2023 1615   GFRNONAA >60 03/03/2022 1250   GFRAA 76 02/03/2020 0845   Lab Results  Component Value Date   CHOL 127 07/16/2022   HDL 48 07/16/2022   LDLCALC 55 07/16/2022   TRIG 119 07/16/2022   CHOLHDL 2.6 07/16/2022   Lab Results  Component Value Date   HGBA1C 6.5 (H) 10/09/2023   Lab Results  Component Value Date   VITAMINB12 326 10/09/2023   Lab Results  Component Value Date   TSH 0.746 11/07/2022    PHYSICAL EXAM:  Vitals:   12/13/23 1505  BP: 112/74   Pulse: 72   No data found. Body mass index is 43.26 kg/m.   Wt Readings from Last 3 Encounters:  12/13/23 207 lb (93.9 kg)  11/26/23 200 lb (90.7 kg)  11/22/23 200 lb (90.7 kg)     Ht Readings from Last 3 Encounters:  12/13/23 4' 10 (1.473 m)  11/26/23 4' 11 (1.499 m)  11/22/23 4' 11 (1.499 m)      General: The patient is awake, alert and appears not in acute distress and groomed. Head: Normocephalic, atraumatic.  Neck is supple.  Mallampati 4,  neck circumference: 17  Nasal airflow unrestricted ,  TMJ is  without clicking ,   Retrognathia is seen !.  Cardiovascular:  Regular rate and rhythm , without  murmurs or carotid bruit, and without distended neck veins. Respiratory: Lungs are clear to auscultation.  Had Lumpectomy on the right breast.   Skin:  Without evidence of edema, or rash, lymphedema in the right breast   Trunk: BMI is elevated , BMI 43.   Neurologic exam : The patient is awake and alert, oriented to place and time.   Memory subjective  described as intact.  There is a normal attention span & concentration ability.  Speech is fluent without dysarthria, dysphonia or aphasia.  Mood and affect are intact.  Cranial nerves: Pupils are equal and briskly reactive to light. There is cataract noted.  Reports blurred vision , uses bifocal .   Extraocular movements  in vertical and horizontal planes intact , but there is coarse saccades noted with left to right gaze, and not a smooth pursuit . Visual fields by finger perimetry are intact. Hearing to finger rub intact.   Facial sensation intact to fine touch.  Facial motor strength is symmetric and tongue  midline.   Motor exam:  Normal tone muscle bulk and symmetric strength in all extremities.     ASSESSMENT AND PLAN :   63 y.o. year old female  here with:  Need for a new CPAP,     1) likely still having OSA, based on risk factor obesity ; BMI of 43. 3 , neck size   , Mallampati 3  :  no nocturia. Not   EDS,  snoring is controlled by CPAP.   2) repeating a HST for baseline, then ordering new CPAP auto set ResMed device 10-17 cm water. 3 cm EPR.  Mask, FFM, ResMed F 20.   3) Rv with NP after 60 days - before 90 days of use.    I would like to thank [facility] and Claudene Prentice DELENA Mickey., Fnp [address] [place],  [place] [address] for allowing me to meet with this pleasant patient.   Sleep Clinic Patients are generally offered input on sleep hygiene,  life style changes and how to improve compliance with medical treatment where applicable. Review and reiteration of good sleep hygiene measures is offered to any sleep clinic patient, be it in the first consultation or with any follow up visits.Any patient with sleepiness should be cautioned not to drive, work at heights, or operate dangerous or heavy equipment when feeling tired or sleepy.   The patient will be seen in follow-up in the sleep clinic at [facility] for discussion of test results, sleep related symptoms and treatment compliance review, further management strategies, etc.   The referring provider will be notified of the test results.   The patient's condition requires frequent monitoring and adjustments in the treatment plan, reflecting the ongoing complexity of care.  This provider is the continuing focal point for all needed services for this condition.  After spending a total time of  35  minutes face to face and time for  history taking, physical and neurologic examination, review of laboratory studies,  personal review of imaging studies, reports and results of other testing and review of referral information / records as far as provided in visit,   Electronically signed by: Dedra Gores, MD 12/13/2023 3:29 PM  [facility] and [facility] Sleep Board certified by The [employer] of Sleep Medicine and Diplomate of the [employer] of Sleep Medicine. Board certified In Neurology through the ABPN, Fellow of the  [employer] of Neu[employer].

## 2023-12-20 ENCOUNTER — Ambulatory Visit (INDEPENDENT_AMBULATORY_CARE_PROVIDER_SITE_OTHER): Admitting: Adult Health

## 2023-12-20 ENCOUNTER — Encounter (INDEPENDENT_AMBULATORY_CARE_PROVIDER_SITE_OTHER): Payer: Self-pay | Admitting: Adult Health

## 2023-12-20 VITALS — BP 109/74 | HR 71 | Temp 97.9°F | Ht 59.0 in | Wt 203.0 lb

## 2023-12-20 DIAGNOSIS — E559 Vitamin D deficiency, unspecified: Secondary | ICD-10-CM

## 2023-12-20 DIAGNOSIS — E1165 Type 2 diabetes mellitus with hyperglycemia: Secondary | ICD-10-CM | POA: Diagnosis not present

## 2023-12-20 DIAGNOSIS — E669 Obesity, unspecified: Secondary | ICD-10-CM

## 2023-12-20 DIAGNOSIS — Z6841 Body Mass Index (BMI) 40.0 and over, adult: Secondary | ICD-10-CM

## 2023-12-20 DIAGNOSIS — Z7984 Long term (current) use of oral hypoglycemic drugs: Secondary | ICD-10-CM

## 2023-12-20 DIAGNOSIS — T8090XS Unspecified complication following infusion and therapeutic injection, sequela: Secondary | ICD-10-CM

## 2023-12-20 DIAGNOSIS — G4733 Obstructive sleep apnea (adult) (pediatric): Secondary | ICD-10-CM

## 2023-12-20 MED ORDER — VITAMIN D (ERGOCALCIFEROL) 1.25 MG (50000 UNIT) PO CAPS: 50000.0000 [IU] | ORAL_CAPSULE | ORAL | 0 refills | Status: DC

## 2023-12-20 MED ORDER — METFORMIN HCL ER 500 MG PO TB24: 500.0000 mg | ORAL_TABLET | Freq: Every day | ORAL | 0 refills | Status: DC

## 2023-12-20 NOTE — Progress Notes (Signed)
 WEIGHT SUMMARY AND BIOMETRICS  Vitals Temp: 97.9 F (36.6 C) BP: 109/74 Pulse Rate: 71 SpO2: 99 %   Anthropometric Measurements Height: 4' 10 (1.473 m) Weight: 203 lb (92.1 kg) BMI (Calculated): 42.44 Weight at Last Visit: 200 lb Weight Lost Since Last Visit: 0 Weight Gained Since Last Visit: 3 lb Starting Weight: 209 lb Total Weight Loss (lbs): 6 lb (2.722 kg) Peak Weight: 245 lb   Body Composition  Body Fat %: 48.4 % Fat Mass (lbs): 98.4 lbs Muscle Mass (lbs): 99.6 lbs Total Body Water (lbs): 78 lbs Visceral Fat Rating : 16   Other Clinical Data RMR: [id number] Fasting: yes Labs: no Today's Visit #: 28 Starting Date: 06/01/21    Chief Complaint:   OBESITY [name] is here to discuss her progress with her obesity treatment plan.  She is on the the Category 2 Plan and states she is following her eating plan approximately 75 % of the time.  She states she is exercising Walking 30 minutes 3 times per week.  Interim History:  11/07/2023 Mounjaro  stopped due to injection site reactions. Metformin  500mg  1/2 tab increased to full tap and converted from plain to XR formulation She is tolerating Metformin  XR 500mg  daily  She was recently seen by her PCP/Brandi Harris, NP-C She was started on daily Rybelsus 7mg - she has yet to start Dicussed how Metformin  and Rybelsus work to treat diabetes and obesity and that  they work synergistly together.  She was recently seen by her Neurologist for OSA tx.  [name] states I am diligent with my CPAP.  Subjective:   1. OSA on CPAP 12/13/2023 Neurology OV Notes: HISTORY OF PRESENT ILLNESS:  [name] is a 63 y.o. female patient who is here for revisit 12/13/2023 for a new CPAP.   I had the pleasure of seeing [name] and again whom I had evaluated for sleep apnea in November 2015.  She has been diagnosed with obstructive sleep apnea and started CPAP, but her machine is now 64 years old.  It has started  to sputter and not reliably provide positive airway pressure to her..   She was dx by a PSG , AHI 7.9 /h mild apnea and started on CPAP.  She used 16 cm water, 3 cm EPR and had reached an AHI 0.3/h. She was recently on Ozempic  and Mounjaro , couldn't tolerate either, she started itching.   She struggles with DM and obesity and she feels better when her CPAP works well.    She is back at work for [employer] benefits, 12 hours a week.  She sleeps much better on CAP/     ASSESSMENT AND PLAN :    63 y.o. year old female  here with:   Need for a new CPAP,      1) likely still having OSA, based on risk factor obesity ; BMI of 43. 3 , neck size   , Mallampati 3  :  no nocturia. Not  EDS,  snoring is controlled by CPAP.    2) repeating a HST for baseline, then ordering new CPAP auto set ResMed device 10-17 cm water. 3 cm EPR.  Mask, FFM, ResMed F 20.    3) Rv with NP after 60 days - before 90 days of use.   2. Injection site reaction, sequela 11/07/2023 Mounjaro  stopped due to injection site reactions. She denies acute skin irritation at present  3. Type 2 diabetes mellitus with hyperglycemia, without long-term current use  of insulin  (HCC) 11/07/2023 Mounjaro  stopped due to injection site reactions. Metformin  500mg  1/2 tab increased to full tap and converted from plain to XR formulation She is tolerating Metformin  XR 500mg  daily  She was recently seen by her PCP/Brandi Harris, NP-C She was started on daily Rybelsus 7mg - she has yet to start Dicussed how Metformin  and Rybelsus work to treat diabetes and obesity and that  they work synergistly together.  4. Vitamin D  deficiency  Latest Reference Range & Units 10/09/23 08:28  Vitamin D , 25-Hydroxy 30.0 - 100.0 ng/mL 46.6   She endorses stable energy She is on weekly Ergocalciferol - denies N/V/Muscle Weakness  Assessment/Plan:   1. OSA on CPAP Continue with weight loss efforts  2. Injection site reaction, sequela Remain off  Mounjaro   3. Type 2 diabetes mellitus with hyperglycemia, without long-term current use of insulin  (HCC) (Primary) Refill  metFORMIN  (GLUCOPHAGE -XR) 500 MG 24 hr tablet Take 1 tablet (500 mg total) by mouth daily with breakfast. Dispense: 30 tablet, Refills: 0 ordered   Start Rybelsus per PCP  4. Vitamin D  deficiency Refill  Vitamin D , Ergocalciferol , (DRISDOL ) 1.25 MG (50000 UNIT) CAPS capsule Take 1 capsule (50,000 Units total) by mouth every 7 (seven) days. Dispense: 4 capsule, Refills: 0 ordered   5. BMI 40.0-44.9, adult (HCC) Current BMI 41.1  [name] is currently in the action stage of change. As such, her goal is to continue with weight loss efforts. She has agreed to the Category 2 Plan.   Exercise goals: All adults should avoid inactivity. Some physical activity is better than none, and adults who participate in any amount of physical activity gain some health benefits. Adults should also include muscle-strengthening activities that involve all major muscle groups on 2 or more days a week.  Behavioral modification strategies: increasing lean protein intake, decreasing simple carbohydrates, increasing vegetables, increasing water intake, no skipping meals, meal planning and cooking strategies, keeping healthy foods in the home, ways to avoid boredom eating, and planning for success.  [name] has agreed to follow-up with our clinic in 4 weeks. She was informed of the importance of frequent follow-up visits to maximize her success with intensive lifestyle modifications for her multiple health conditions.   Objective:   Blood pressure 109/74, pulse 71, temperature 97.9 F (36.6 C), height 4' 10 (1.473 m), weight 203 lb (92.1 kg), SpO2 99%. Body mass index is 42.43 kg/m.  General: Cooperative, alert, well developed, in no acute distress. HEENT: Conjunctivae and lids unremarkable. Cardiovascular: Regular rhythm.  Lungs: Normal work of breathing. Neurologic: No focal deficits.    Lab Results  Component Value Date   CREATININE 1.04 (H) 11/12/2023   BUN 18 11/12/2023   NA 139 11/12/2023   K 3.8 11/12/2023   CL 107 11/12/2023   CO2 24 11/12/2023   Lab Results  Component Value Date   ALT 23 10/09/2023   AST 16 10/09/2023   ALKPHOS 64 10/09/2023   BILITOT 0.3 10/09/2023   Lab Results  Component Value Date   HGBA1C 6.5 (H) 10/09/2023   HGBA1C 7.1 (H) 05/07/2023   HGBA1C 7.9 (H) 12/11/2022   HGBA1C 7.3 (H) 07/15/2022   HGBA1C 6.4 (H) 09/28/2021   Lab Results  Component Value Date   INSULIN  19.1 10/09/2023   INSULIN  20.2 06/05/2023   INSULIN  13.4 08/14/2022   INSULIN  22.1 09/28/2021   INSULIN  26.8 (H) 06/01/2021   Lab Results  Component Value Date   TSH 0.746 11/07/2022   Lab Results  Component Value Date  CHOL 127 07/16/2022   HDL 48 07/16/2022   LDLCALC 55 07/16/2022   TRIG 119 07/16/2022   CHOLHDL 2.6 07/16/2022   Lab Results  Component Value Date   VD25OH 46.6 10/09/2023   VD25OH 51.0 05/07/2023   VD25OH 33.5 01/11/2023   Lab Results  Component Value Date   WBC 5.3 11/12/2023   HGB 13.0 11/12/2023   HCT 40.3 11/12/2023   MCV 92.6 11/12/2023   PLT 195 11/12/2023   No results found for: IRON, TIBC, FERRITIN  Attestation Statements:   Reviewed by clinician on day of visit: allergies, medications, problem list, medical history, surgical history, family history, social history, and previous encounter notes.  I have reviewed the above documentation for accuracy and completeness, and I agree with the above. -  Christeena Krogh d. Aluel Schwarz, NP-C

## 2023-12-21 ENCOUNTER — Ambulatory Visit (HOSPITAL_COMMUNITY)
Admission: RE | Admit: 2023-12-21 | Discharge: 2023-12-21 | Disposition: A | Discharge status: Home / Self Care | Source: Ambulatory Visit | Attending: Cardiology | Admitting: Cardiology

## 2023-12-21 DIAGNOSIS — R079 Chest pain, unspecified: Secondary | ICD-10-CM | POA: Diagnosis present

## 2023-12-21 LAB — ECHOCARDIOGRAM COMPLETE
AR max vel: 2.33 cm2
AV Area VTI: 2.47 cm2
AV Area mean vel: 2.38 cm2
AV Mean grad: 4 mmHg
AV Peak grad: 7.8 mmHg
Ao pk vel: 1.4 m/s
Area-P 1/2: 4.17 cm2
S' Lateral: 3.2 cm

## 2023-12-22 ENCOUNTER — Other Ambulatory Visit (INDEPENDENT_AMBULATORY_CARE_PROVIDER_SITE_OTHER): Payer: Self-pay | Admitting: Adult Health

## 2023-12-22 ENCOUNTER — Other Ambulatory Visit (INDEPENDENT_AMBULATORY_CARE_PROVIDER_SITE_OTHER): Payer: Self-pay | Admitting: Family Medicine

## 2023-12-22 DIAGNOSIS — F419 Anxiety disorder, unspecified: Secondary | ICD-10-CM

## 2023-12-26 ENCOUNTER — Ambulatory Visit: Payer: Self-pay | Admitting: Physician Assistant

## 2024-01-08 ENCOUNTER — Ambulatory Visit (INDEPENDENT_AMBULATORY_CARE_PROVIDER_SITE_OTHER): Admitting: Neurology

## 2024-01-08 DIAGNOSIS — G4733 Obstructive sleep apnea (adult) (pediatric): Secondary | ICD-10-CM | POA: Diagnosis not present

## 2024-01-08 DIAGNOSIS — E66813 Obesity, class 3: Secondary | ICD-10-CM

## 2024-01-08 DIAGNOSIS — R0602 Shortness of breath: Secondary | ICD-10-CM

## 2024-01-08 DIAGNOSIS — E1165 Type 2 diabetes mellitus with hyperglycemia: Secondary | ICD-10-CM

## 2024-01-09 ENCOUNTER — Telehealth: Payer: Self-pay | Admitting: Neurology

## 2024-01-09 NOTE — Telephone Encounter (Signed)
 Spoke w/Pt who stated the machine she used for her HST that she received from the sleep lab smelled of cigarette smoke. Pt stated she wiped it down as best she could but was concerned a machine was sent out smelling like it did. Pt stated she completed the HST and will be returning the machine today to the sleep lab. Apologized to Pt for the issue and informed her this will be passed on to the sleep lab. Pt voiced understanding and thanks for the call back.

## 2024-01-09 NOTE — Telephone Encounter (Signed)
 Patient left a voicemail on our phone stating she would like to change her machine because it smells like cigarette smoke.

## 2024-01-10 ENCOUNTER — Ambulatory Visit: Admitting: Internal Medicine

## 2024-01-10 NOTE — Progress Notes (Signed)
 [facility]  [name] 63 year old female 10-Jul-1960   HOME SLEEP TEST REPORT ( by Watch PAT)   STUDY DATE:  9-16 -25, data load on 01-10-2024     ORDERING CLINICIAN:  REFERRING CLINICIAN:  FNP [name]    CLINICAL INFORMATION/HISTORY:  12-13-2023: here for revisit 12/13/2023 for a new CPAP.   I had the pleasure of seeing [name] again who I had evaluated for sleep apnea back  in November 2015.  She has been diagnosed with obstructive sleep apnea and started CPAP, but her machine is now 64 years old.  It has started to sputter and not reliably provide positive airway pressure to her.   She was dx by a PSG , with mild apnea  of AHI 7.9 /h  and started on CPAP.  She used 16 cm water, 3 cm EPR and had reached an AHI 0.3/h. She was recently on Ozempic  and Mounjaro , couldn't tolerate either, she started itching.   She struggles with DM and obesity and she feels better when her CPAP works well.    She is back at work for [employer] benefits, 12 hours a week.  She sleeps much better on CPAP/        Epworth sleepiness score: 6/ 24, FSS at 22/63  BMI: 43.2  kg/m  Neck Circumference: 17       Sleep Summary:   Total Recording Time (hours, min):    8 hours and 26 minutes     Total Sleep Time (hours, min):     7 h and 18 minutes             Percent REM (%):    17.4 %                                    Respiratory Indices:   Calculated pAHI (per AASM guideline): 17.9/h                         REM pAHI:   36 /h                                              NREM pAHI:   14.1/h                             Positional AHI:  supine AHI was 18.7 /h and non-supine AHI was 16.3/h    Snoring:  46 dB mean volume - loud.                                               Oxygen Saturation Statistics:   Oxygen Saturation (%) Mean:  92%, between 78% and 97% and with 7.2 minutes of hypoxia-  O2 Saturation (minutes) <89%:  all hypoxia was related  to supine sleep.          Pulse Rate Statistics:   Pulse Mean (bpm):   83 bpm and varied from  59 through 115 bpm  IMPRESSION:  This HST confirms the presence of  moderate severe Obstructive sleep apnea associated with hypoxia and with loud snoring.    RECOMMENDATION: continuation of CPAP therapy is recommended.  New autotitration CPAP device by ResMed will be provided with a setting from 10-16 cm water, 3 cm EPR and heated humidifier, and mask of choice.     The FDA approved weight loss medication for patients with obesity related sleep apnea ( OSA) with moderate to severe degree of apnea. The medication can be prescribed by PCP or weight loss clinics but may still not be covered by insurance.   Any patient should be cautioned not to drive, work at heights, or operate dangerous or heavy equipment when tired or sleepy.   Review of good sleep hygiene measures is accessible to any sleep clinic patient and can be reiterated through online material- I we recommend the Guide to better Sleep   by the NIH.   Weight loss and Core Strength improvement is highly recommended for individuals with low muscle tone and/ or a BMI over 30.  Any CPAP patient should be reminded to be fully compliant with PAP therapy , (defined as using PAP therapy for more than 4 hours each night ) with the goal to improve sleep related symptoms and long term cardiovascular risks.    Please note that untreated obstructive sleep apnea may carry additional perioperative morbidity. Patients with significant obstructive sleep apnea should receive perioperative PAP therapy and the surgical team should be informed of the diagnosis and degree of sleep disordered breathing.  The referring physician will be notified of the test results.       INTERPRETING PHYSICIAN:   Dedra Gores, MD  [facility] and [facility] Sleep Board certified by The [employer] Sleep Medicine and  Diplomate of the [employer] of Sleep Medicine. Board certified In Neurology through the ABPN, Fellow of the [employer] of Neu[employer].

## 2024-01-10 NOTE — Progress Notes (Deleted)
 Patient ID: [name], female   DOB: 11/12/1960, 63 y.o.   MRN: [id number]  HPI  [name] is a 63 y.o.-year-old female, initially referred by her PCP, Tara Jarvis, NP, returning for follow-up for thyroid  nodules.  Last visit 1 year ago.  Interim history: She feels well at today's appointment, without complaints.  Reviewed and addended history: 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 She was followed for this by surgery reportedly (no records available for review) with imaging every 3-6 mo. Since no growth was detected >> she did not have surgery.  She was also reported to have a mediastinal mass apparently connected to the thyroid  on 2 CT angio studies from 2021 (3 x 2.5 cm in size).  She also has a history of PE in 2008.  She presented with chest pain and cough and was again investigated for PE via a CT chest in 02/2021 that showed a retrosternal likely right thyroid  lobe nodule.  Upon questioning, she does mention she noticed pressure in neck and neck swelling 1 year prior to this finding, but this improved  Reviewed available reports/images/pathology results: Chest CT angio (08/14/2019):  Enlarged, heterogeneous and multinodular thyroid  including a nodule which extends inferiorly from the right thyroid  lobe into the thoracic inlet measuring up to 2.3 cm in size.   Chest CT angio (09/25/2019): Thyroid  remains enlarged with a dominant mass arising from the right lobe of the thyroid  measuring 3.0 x 2.5 cm. Thyroid  otherwise appears somewhat inhomogeneous. There is no appreciable thoracic adenopathy. Small hiatal hernia noted.  Thyroid  U/S (05/27/2020): Parenchymal Echotexture: Moderately heterogenous  Isthmus: 0.7 cm  Right lobe: 6.5 x 3 1 x 2.8 cm  Left lobe: 4.5 x 1.6 x 1.5 cm  _________________________________________________________   Nodule # 1:  Location: Right; Inferior  Maximum size:  3.8 cm; Other 2 dimensions: 3.1 x 2.8 cm  Composition: solid/almost completely solid (2)  Echogenicity: isoechoic (1)  **Given size (>/= 2.5 cm) and appearance, fine needle aspiration of this mildly suspicious nodule should be considered based on TI-RADS criteria.  _________________________________________________________   IMPRESSION: Right inferior solid thyroid  nodule (versus pseudonodule given diffusely heterogeneous thyroid  parenchyma) measuring up to approximately 3.8 cm, mildly suspicious for malignancy (TI-RADS category 3). This lesion meets criteria for tissue sampling. Recommend ultrasound guided fine needle aspiration.  FNA (11/04/2020): Clinical History: Right inferior 3.8cm; Other 2 dimensions: 3.1 x 2.8cm,  Solid / almost completely solid, Isoechoic, TI-RADS total points 3  Specimen Submitted:  A. THYROID , RLP, FINE NEEDLE ASPIRATION:   FINAL MICROSCOPIC DIAGNOSIS:  - Consistent with benign follicular nodule (Bethesda category II)   SPECIMEN ADEQUACY:  Satisfactory for evaluation   Chest CT with and without contrast (03/07/2021):  Stable, rounded, retrosternal density that is slightly exophytic from the large right thyroid  lobe.   Previously seen 3.4 cm right thyroid  nodule was not visible anymore.  Thyroid  U/S (06/10/2021): Parenchymal Echotexture: Moderately heterogenous Isthmus: 0.7 cm  Right lobe: 6.2 x 2.3 x 2.5 cm  Left lobe: 4.1 x 1.3 x 2.0 cm  _________________________________________________________   Diffusely heterogeneous thyroid  gland with extensive goitrous change. Multiple small thyroid  nodules are identified in bilaterally measuring less than 1 cm. These are considered incidental and warrant no further follow-up.   Nodule # 3: Approximately 1.3 cm isoechoic solid nodule in the right mid gland does not meet criteria to warrant further evaluation.   Nodule # 4: Previously  biopsied mass in the lower pole of the right gland measures 3.1 x 3.0 x  3.0 cm. This is slightly smaller than 3.1 x 3.1 x 2.8 cm measured previously.   No new nodules or suspicious features.   IMPRESSION: 1. Similar appearance of diffusely enlarged, heterogeneous and multinodular thyroid  gland most consistent with multinodular goiter. 2. Slight interval decrease in size of previously biopsied mass in the lower pole of the right gland. 3. No new nodules or suspicious features identified.  CT chest (PE protocol, 11/01/2022): Mediastinum/Nodes: No hilar or mediastinal adenopathy. The esophagus is grossly unremarkable. Asymmetric enlargement of the right thyroid  lobe with a 3.7 cm right lobe nodule. This has been evaluated on previous imaging. (ref: J Am Coll Radiol. 2015 Feb;12(2): 143-50).No mediastinal fluid collection.  Thyroid  U/S (12/07/2022): Parenchymal Echotexture: Mildly heterogenous  Isthmus: 0.6 cm  Right lobe: 6.5 cm x 2.4 cm x 3.0 cm  Left lobe: 4.9 cm x 1.7 cm x 1.9 cm  _________________________________________________________   Estimated total number of nodules >/= 1 cm: 1  _______________________________________________________   Nodule labeled 1 superior right thyroid , 7 mm. Nodule does not meet criteria for surveillance.   Nodule labeled 2 inferior right thyroid , 4.0 cm relatively unchanged. Nodule has undergone prior biopsy 06/09/2021. Assuming benign result no further specific follow-up would be indicated.   No adenopathy   IMPRESSION: Similar appearance of thyroid , including relatively unchanged nodule inferior right thyroid  gland. This nodule has undergone prior biopsy and assuming benign result no further specific follow-up would be indicated. Pt denies:  She denies: - feeling nodules in neck - choking - hoarseness - SOB with lying down She has occasional dysphagia with pill - chronic.  I reviewed pt's thyroid  tests: Lab Results  Component Value Date   TSH 0.746 11/07/2022  Prev.: Lab Results  Component Value  Date   TSH 0.506 04/23/2021   TSH 0.458 03/05/2019   TSH 0.638 11/20/2017   TSH 0.112 (L) 05/24/2016   TSH 0.345 (L) 06/18/2014   TSH 0.167 (L) 10/28/2012   TSH 0.218 (L) 01/09/2012   TSH 0.681 08/06/2007   TSH 0.559 * 04/05/2007   TSH 0.449 01/16/2007    FREET4 1.39 11/20/2017   FREET4 1.32 08/06/2007    Improved: - fatigue - hot flushes - anxiety/depression  Resolved: - weight gain  No: - tremors - palpitations - hyperdefecation/constipation - dry skin - hair loss  + FH of thyroid  ds. - in sister (? Condition). No known FH of thyroid  cancer. No h/o radiation tx to head or neck. No recent contrast studies. No steroid use. No herbal supplements. No Biotin supplements or Hair, Skin and Nails vitamins.  Pt. has a history of DM2 -on Ozempic  previously, but currently on metformin  and Jardiance . This is managed by PCP.  Latest HbA1c reviewed: Lab Results  Component Value Date   HGBA1C 6.5 (H) 10/09/2023   Pt. also has a history of CAD, dCHF, h/o TIA, HL, HTN, OSA (on CPAP), glaucoma, h/o PE in 2008, depression. She was prev. [facility]en [facility]. She was diagnosed with invasive ductal breast cancer -per biopsy 11/16/2021. She had resection. She had RAI tx and ChTx. She was in the emergency room with atypical chest pain x 2, in 10/12/2022 and 11/11/2022.  She was briefly admitted with chest pain in 06/2022. She saw cardiology had the echo which showed an LVEF of 50 to 55%. She was started on Spironolactone .  ROS: + See HPI  Past Medical History:  Diagnosis Date  Allergy    Anginal pain (HCC)    a. NL cath in 2008;  b. Myoview  03/2011: dec uptake along mid anterior wall on stress imaging -> ? attenuation vs. ischemia, EF 65%;  c. Echo 04/2011: EF 55-60%, no RWMA, Gr 2 dd   Anxiety    Arthritis    Asthma    Back pain    Bone cancer (HCC)    Cancer (HCC)    Chest pain    CHF (congestive heart failure) (HCC)    Clotting disorder (HCC)    Constipation     Depression    Diabetes mellitus    Drug use    Dyspnea    Frequent urination    GERD (gastroesophageal reflux disease)    Glaucoma    History of stomach ulcers    HLD (hyperlipidemia)    Hypertension    IBS (irritable bowel syndrome)    Joint pain    Joint pain    Lactose intolerance    Leg edema    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 2008 -> coumadin x 6 mos.   Rheumatoid arthritis (HCC)    Sleep apnea    on CPAP 02/2018   SOB (shortness of breath)    Thyroid  disease    TIA (transient ischemic attack)    Urinary urgency    Vitamin D  deficiency    Past Surgical History:  Procedure Laterality Date   ABDOMINAL HYSTERECTOMY  2005   APPENDECTOMY     BREAST LUMPECTOMY WITH RADIOACTIVE SEED AND SENTINEL LYMPH NODE BIOPSY Right 11/16/2021   Procedure: RIGHT BREAST RADIOACTIVE SEED LOCALIZED LUMPECTOMY WITH RIGHT AXILLARY SENTINEL LYMPH NODE BIOPSY;  Surgeon: Belinda Cough, MD;  Location: [facility];  Service: General;  Laterality: Right;  LMA & PEC BLOCK   CARDIAC CATHETERIZATION     Normal   CARDIAC CATHETERIZATION N/A 09/30/2014   Procedure: Left Heart Cath and Coronary Angiography;  Surgeon: Ozell Fell, MD;  Location: [facility] INVASIVE CV LAB;  Service: Cardiovascular;  Laterality: N/A;   LAPAROSCOPIC APPENDECTOMY N/A 06/03/2012   Procedure: APPENDECTOMY LAPAROSCOPIC;  Surgeon: Jina Nephew, MD;  Location: [facility] OR;  Service: General;  Laterality: N/A;   Left knee surgery  2008   LEG SURGERY     RIGHT/LEFT HEART CATH AND CORONARY ANGIOGRAPHY N/A 01/10/2023   Procedure: RIGHT/LEFT HEART CATH AND CORONARY ANGIOGRAPHY;  Surgeon: Wendel Lurena POUR, MD;  Location: [facility] INVASIVE CV LAB;  Service: Cardiovascular;  Laterality: N/A;   TONSILLECTOMY     TOTAL HIP ARTHROPLASTY Left 06/11/2018   Procedure: LEFT TOTAL HIP  ARTHROPLASTY ANTERIOR APPROACH;  Surgeon: Addie Cordella Hamilton, MD;  Location: [facility] OR;  Service: Orthopedics;  Laterality: Left;   TOTAL KNEE ARTHROPLASTY Left 08/22/2016   Procedure: TOTAL KNEE ARTHROPLASTY;  Surgeon: Addie Hamilton Cordella, MD;  Location: [facility] OR;  Service: Orthopedics;  Laterality: Left;   TUBAL LIGATION  1989   Social History   Socioeconomic History   Marital status: Divorced    Spouse name: Not on file   Number of children: 3   Years of education: Not on file   Highest education level: Not on file  Occupational History   Occupation: UNEMPLOYED    Employer: DISABLED  Tobacco Use   Smoking status:  Former    Current packs/day: 0.00    Average packs/day: 0.5 packs/day for 15.0 years (7.5 ttl pk-yrs)    Types: Cigarettes    Start date: 04/24/1970    Quit date: 04/24/1985    Years since quitting: 38.7   Smokeless tobacco: Never  Vaping Use   Vaping status: Never Used  Substance and Sexual Activity   Alcohol use: No    Alcohol/week: 0.0 standard drinks of alcohol   Drug use: Not Currently    Types: Marijuana   Sexual activity: Not Currently    Birth control/protection: Surgical    Comment: hyst  Other Topics Concern   Not on file  Social History Narrative   Lives in [place] by herself.  Disabled.  Occasional Tea .  3 kids, 6 grandkids.     Water aerobics 3 times a week.   Social Drivers of Corporate investment banker Strain: High Risk (03/27/2022)   Overall Financial Resource Strain (CARDIA)    Difficulty of Paying Living Expenses: Very hard  Food Insecurity: Food Insecurity Present (03/27/2022)   Hunger Vital Sign    Worried About Running Out of Food in the Last Year: Sometimes true    Ran Out of Food in the Last Year: Sometimes true  Transportation Needs: No Transportation Needs (03/27/2022)   PRAPARE - Administrator, Civil Service (Medical): No    Lack of Transportation (Non-Medical): No  Physical Activity: Not on file  Stress: Not on file  Social  Connections: Not on file  Intimate Partner Violence: Not At Risk (03/28/2022)   Humiliation, Afraid, Rape, and Kick questionnaire    Fear of Current or Ex-Partner: No    Emotionally Abused: No    Physically Abused: No    Sexually Abused: No   Current Outpatient Medications on File Prior to Visit  Medication Sig Dispense Refill   ACCU-CHEK GUIDE test strip      Accu-Chek Softclix Lancets lancets SMARTSIG:Topical     albuterol  (PROVENTIL  HFA;VENTOLIN  HFA) 108 (90 Base) MCG/ACT inhaler Inhale 2 puffs into the lungs every 6 (six) hours as needed for wheezing or shortness of breath.     albuterol  (VENTOLIN  HFA) 108 (90 Base) MCG/ACT inhaler Inhale 1-2 puffs into the lungs every 6 (six) hours as needed for wheezing or shortness of breath. 1 each 0   aspirin  81 MG chewable tablet Chew 81 mg by mouth daily.     atorvastatin  (LIPITOR) 40 MG tablet Take 40 mg by mouth at bedtime.      BIOTIN PO Take 1 tablet by mouth at bedtime.     Blood Glucose Monitoring Suppl (ACCU-CHEK GUIDE ME) w/Device KIT daily.     busPIRone  (BUSPAR ) 30 MG tablet Take 30 mg by mouth 2 (two) times daily.     carvedilol  (COREG ) 3.125 MG tablet Take 1 tablet (3.125 mg total) by mouth 2 (two) times daily. 180 tablet 3   clindamycin  (CLEOCIN ) 150 MG capsule TAKE 4 CAPSULES 1 HOUR PRIOR TO PROCEDURE     cyclobenzaprine  (FLEXERIL ) 10 MG tablet Take 1 tablet (10 mg total) by mouth 2 (two) times daily as needed for muscle spasms. 20 tablet 0   diclofenac  Sodium (VOLTAREN ) 1 % GEL Apply 2 g topically 4 (four) times daily as needed (joint/muscle pain).     empagliflozin  (JARDIANCE ) 10 MG TABS tablet Take 1 tablet (10 mg total) by mouth daily. 30 tablet 0   EPINEPHrine  0.3 mg/0.3 mL IJ SOAJ injection Inject 0.3 mg into the  muscle as needed for anaphylaxis. Follow package instructions as needed for severe allergy or anaphylactic reaction. 2 each 3   fluticasone  furoate-vilanterol (BREO ELLIPTA ) 100-25 MCG/ACT AEPB Inhale 1 puff into the  lungs daily. 1 each 5   furosemide  (LASIX ) 40 MG tablet Take 1 tablet (40 mg total) by mouth daily as needed for edema or fluid (take in case of weight gain 2 to 3 lbs in 24 hrs or 5 lbs in 7 days.). 30 tablet 0   hydrocortisone 2.5 % cream SMARTSIG:sparingly Topical Twice Daily PRN     hydrOXYzine  (VISTARIL ) 25 MG capsule Take 25 mg by mouth 3 (three) times daily as needed for itching.     ibuprofen  (ADVIL ) 800 MG tablet TAKE 1 TABLET BY MOUTH FOUR TIMES A DAY AS NEEDED FOR PAIN     ipratropium-albuterol  (DUONEB) 0.5-2.5 (3) MG/3ML SOLN Take 3 mLs by nebulization every 6 (six) hours as needed (shortness of breath).     KLOR-CON  M10 10 MEQ tablet Take 10 mEq by mouth 2 (two) times daily.     lidocaine  (LIDODERM ) 5 % APPLY 1 PATCH BY TOPICAL ROUTE ONCE DAILY (MAY WEAR UP TO 12HOURS.)     lisinopril  (ZESTRIL ) 10 MG tablet TAKE 1 PILL IN THE MORNING FOR HYPERTENSION.     loperamide  (IMODIUM ) 2 MG capsule Take 1 capsule (2 mg total) by mouth 4 (four) times daily as needed for diarrhea or loose stools. 12 capsule 0   meloxicam  (MOBIC ) 7.5 MG tablet      metFORMIN  (GLUCOPHAGE -XR) 500 MG 24 hr tablet Take 1 tablet (500 mg total) by mouth daily with breakfast. 30 tablet 0   mupirocin ointment (BACTROBAN) 2 % Apply 1 Application topically 2 (two) times daily.     nitroGLYCERIN  (NITROSTAT ) 0.3 MG SL tablet Place 0.3 mg under the tongue every 5 (five) minutes as needed for chest pain.     nitroGLYCERIN  (NITROSTAT ) 0.4 MG SL tablet PLACE 1 TABLET UNDER THE TONGUE EVERY 5 MINUTES AS NEEDED FOR CHEST PAIN.     nystatin (MYCOSTATIN/NYSTOP) powder Apply 1 application topically 3 (three) times daily as needed for rash.     ondansetron  (ZOFRAN ) 8 MG tablet PLEASE SEE ATTACHED FOR DETAILED DIRECTIONS     ondansetron  (ZOFRAN -ODT) 4 MG disintegrating tablet Take 1 tablet (4 mg total) by mouth every 8 (eight) hours as needed for nausea or vomiting. 20 tablet 0   oxyCODONE  (OXY IR/ROXICODONE ) 5 MG immediate release  tablet Take 1 tablet by mouth every 6 (six) hours as needed.     pantoprazole  (PROTONIX ) 40 MG tablet Take 40 mg by mouth daily.     sertraline  (ZOLOFT ) 25 MG tablet Take 1 tablet (25 mg total) by mouth daily. 90 tablet 0   spironolactone  (ALDACTONE ) 25 MG tablet Take 25 mg by mouth every other day.     tirzepatide  (MOUNJARO ) 5 MG/0.5ML Pen INJECT 5 MG SUBCUTANEOUSLY ONCE A WEEK     triamcinolone  ointment (KENALOG ) 0.1 % APPLY A THIN LAYER TO THE AFFECTED AREA(S) ON LEFT ARM BY TOPICAL ROUTE 2 TIMES PER DAY FOR 7 DAYS     Vitamin D , Ergocalciferol , (DRISDOL ) 1.25 MG (50000 UNIT) CAPS capsule Take 1 capsule (50,000 Units total) by mouth every 7 (seven) days. 4 capsule 0   No current facility-administered medications on file prior to visit.   Allergies  Allergen Reactions   Shellfish Allergy Hives and Anaphylaxis   Hydrocodone -Acetaminophen  Itching and Other (See Comments)    confusion Mental Status Changes  Penicillins Swelling and Other (See Comments)   Gabapentin      Not available   Latex Rash   Sulfa Antibiotics Diarrhea, Itching and Rash   Family History  Problem Relation Age of Onset   Emphysema Mother    Arthritis Mother    Heart failure Mother        alive @ 12   Stroke Mother    Diabetes Mother    Hypertension Mother    Hyperlipidemia Mother    Depression Mother    Anxiety disorder Mother    Heart disease Mother    Alcoholism Mother    Depression Father    Heart disease Father        died @ 30's.   Stroke Father    Diabetes Father    Hyperlipidemia Father    Hypertension Father    Bipolar disorder Father    Sleep apnea Father    Alcoholism Father    Drug abuse Father    Sudden death Father    Diabetes Sister    Asthma Brother    Colon cancer Maternal Grandfather        dx after 50   Heart disease Paternal Grandfather    Breast cancer Maternal Aunt 33 - 37   Breast cancer Cousin        dx 54s   Asthma Daughter    Esophageal cancer Neg Hx    Stomach  cancer Neg Hx    Rectal cancer Neg Hx    PE: There were no vitals taken for this visit. Wt Readings from Last 3 Encounters:  12/20/23 203 lb (92.1 kg)  12/13/23 207 lb (93.9 kg)  11/26/23 200 lb (90.7 kg)   Constitutional: overweight, in NAD Eyes: EOMI, no exophthalmos ENT: + R thyromegaly, no cervical lymphadenopathy Cardiovascular: RRR, No MRG Respiratory: CTA B Musculoskeletal: no deformities Skin: no rashes Neurological: no tremor with outstretched hands  ASSESSMENT: 1. Thyroid  nodules  2.  Mediastinal mass  PLAN: 1. Thyroid  nodules -Patient with a history of multiple thyroid  nodules, of which the dominant nodule was found in the right gland and extending towards mediastinum.  This measured 3.8 cm initially but then involuted to 3.1 cm.  We biopsied this nodule 2 years ago and the results are benign -She also has several smaller nodules which appear to be low risk and one R mid thyroid  nodule that had several benign features: Isoechoic, without microcalcifications or internal blood flow, more wide than tall, and with regular margins. - Also, the patient does not have a thyroid  cancer family history or personal history of radiation therapy to head or neck, favoring benignity. - In 10/2022 she had a neck CT showing asymmetric enlargement of the right thyroid  with a 3.7 cm nodule.  We discussed that CT measurements were not as accurate as ultrasound measurements.  We checked another thyroid  ultrasound 12/07/2022 and this showed relative stability of the nodule (measuring 4 cm in the largest dimension) without further imaging necessary - No intervention is needed for now - Latest TFTs were reviewed and they were normal in 10/2022.  We will repeat this today - I will see her back in 1 year  2.  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  Lela Fendt, MD PhD [facility]

## 2024-01-16 ENCOUNTER — Ambulatory Visit: Payer: Self-pay | Admitting: Neurology

## 2024-01-16 ENCOUNTER — Other Ambulatory Visit: Payer: Self-pay | Admitting: Neurology

## 2024-01-16 DIAGNOSIS — G4710 Hypersomnia, unspecified: Secondary | ICD-10-CM

## 2024-01-16 DIAGNOSIS — G4733 Obstructive sleep apnea (adult) (pediatric): Secondary | ICD-10-CM

## 2024-01-16 DIAGNOSIS — Z86718 Personal history of other venous thrombosis and embolism: Secondary | ICD-10-CM

## 2024-01-16 NOTE — Progress Notes (Signed)
 CPAP ordered,.CD

## 2024-01-16 NOTE — Procedures (Signed)
 [facility]  [name] 63 year old female Feb 24, 1961   HOME SLEEP TEST REPORT ( by Watch PAT)   STUDY DATE:  9-16 -25, data load on 01-10-2024     ORDERING CLINICIAN:  REFERRING CLINICIAN:  FNP Smith    CLINICAL INFORMATION/HISTORY:  12-13-2023: here for revisit 12/13/2023 for a new CPAP.   I had the pleasure of seeing [name] again who I had evaluated for sleep apnea back  in November 2015.  She has been diagnosed with obstructive sleep apnea and started CPAP, but her machine is now 64 years old.  It has started to sputter and not reliably provide positive airway pressure to her.   She was dx by a PSG , with mild apnea  of AHI 7.9 /h  and started on CPAP.  She used 16 cm water, 3 cm EPR and had reached an AHI 0.3/h. She was recently on Ozempic  and Mounjaro , couldn't tolerate either, she started itching.   She struggles with DM and obesity and she feels better when her CPAP works well.    She is back at work for [employer] benefits, 12 hours a week.  She sleeps much better on CPAP/        Epworth sleepiness score: 6/ 24, FSS at 22/63  BMI: 43.2  kg/m  Neck Circumference: 17       Sleep Summary:   Total Recording Time (hours, min):    8 hours and 26 minutes     Total Sleep Time (hours, min):     7 h and 18 minutes             Percent REM (%):    17.4 %                                    Respiratory Indices:   Calculated pAHI (per AASM guideline): 17.9/h                         REM pAHI:   36 /h                                              NREM pAHI:   14.1/h                             Positional AHI:  supine AHI was 18.7 /h and non-supine AHI was 16.3/h    Snoring:  46 dB mean volume - loud.                                               Oxygen Saturation Statistics:   Oxygen Saturation (%) Mean:  92%, between 78% and 97% and with 7.2 minutes of hypoxia-  O2 Saturation (minutes) <89%:  all hypoxia was related to supine  sleep.          Pulse Rate Statistics:   Pulse Mean (bpm):   83 bpm and varied from  59 through 115 bpm  IMPRESSION:  This HST confirms the presence of  moderate severe Obstructive sleep apnea associated with hypoxia and with loud snoring.    RECOMMENDATION: continuation of CPAP therapy is recommended.  New autotitration CPAP device by [employer] will be provided with a setting from 10-16 cm water, 3 cm EPR and heated humidifier, and mask of choice.     The FDA approved weight loss medication for patients with obesity related sleep apnea ( OSA) with moderate to severe degree of apnea. The medication can be prescribed by PCP or weight loss clinics but may still not be covered by insurance.   Any patient should be cautioned not to drive, work at heights, or operate dangerous or heavy equipment when tired or sleepy.   Review of good sleep hygiene measures is accessible to any sleep clinic patient and can be reiterated through online material- I we recommend the Guide to better Sleep   by the NIH.   Weight loss and Core Strength improvement is highly recommended for individuals with low muscle tone and/ or a BMI over 30.  Any CPAP patient should be reminded to be fully compliant with PAP therapy , (defined as using PAP therapy for more than 4 hours each night ) with the goal to improve sleep related symptoms and long term cardiovascular risks.    Please note that untreated obstructive sleep apnea may carry additional perioperative morbidity. Patients with significant obstructive sleep apnea should receive perioperative PAP therapy and the surgical team should be informed of the diagnosis and degree of sleep disordered breathing.  The referring physician will be notified of the test results.       INTERPRETING PHYSICIAN:   Dedra Gores, MD  [facility] and [facility] Board certified by The [employer] of Sleep Medicine and Diplomate of the  [employer] of Sleep Medicine. Board certified In Neurology through the ABPN, Fellow of the [employer] of Neu[employer].

## 2024-01-17 ENCOUNTER — Inpatient Hospital Stay: Payer: 59 | Attending: Hematology and Oncology | Admitting: Hematology and Oncology

## 2024-01-17 ENCOUNTER — Ambulatory Visit (INDEPENDENT_AMBULATORY_CARE_PROVIDER_SITE_OTHER): Admitting: Adult Health

## 2024-01-21 ENCOUNTER — Ambulatory Visit (INDEPENDENT_AMBULATORY_CARE_PROVIDER_SITE_OTHER): Admitting: Physician Assistant

## 2024-01-21 ENCOUNTER — Encounter (INDEPENDENT_AMBULATORY_CARE_PROVIDER_SITE_OTHER): Payer: Self-pay | Admitting: Physician Assistant

## 2024-01-21 VITALS — BP 108/74 | HR 83 | Temp 98.3°F | Ht 59.0 in | Wt 198.0 lb

## 2024-01-21 DIAGNOSIS — G478 Other sleep disorders: Secondary | ICD-10-CM

## 2024-01-21 DIAGNOSIS — Z6841 Body Mass Index (BMI) 40.0 and over, adult: Secondary | ICD-10-CM

## 2024-01-21 DIAGNOSIS — E1165 Type 2 diabetes mellitus with hyperglycemia: Secondary | ICD-10-CM | POA: Diagnosis not present

## 2024-01-21 DIAGNOSIS — E559 Vitamin D deficiency, unspecified: Secondary | ICD-10-CM | POA: Diagnosis not present

## 2024-01-21 DIAGNOSIS — F419 Anxiety disorder, unspecified: Secondary | ICD-10-CM | POA: Diagnosis not present

## 2024-01-21 DIAGNOSIS — F32A Depression, unspecified: Secondary | ICD-10-CM | POA: Diagnosis not present

## 2024-01-21 DIAGNOSIS — Z7984 Long term (current) use of oral hypoglycemic drugs: Secondary | ICD-10-CM

## 2024-01-21 DIAGNOSIS — R413 Other amnesia: Secondary | ICD-10-CM

## 2024-01-21 MED ORDER — METFORMIN HCL ER 500 MG PO TB24: 500.0000 mg | ORAL_TABLET | Freq: Every day | ORAL | 0 refills | Status: DC

## 2024-01-21 MED ORDER — VITAMIN D (ERGOCALCIFEROL) 1.25 MG (50000 UNIT) PO CAPS
50000.0000 [IU] | ORAL_CAPSULE | ORAL | 0 refills | Status: DC
Start: 2024-01-21 — End: 2024-02-19

## 2024-01-21 MED ORDER — SERTRALINE HCL 25 MG PO TABS: 25.0000 mg | ORAL_TABLET | Freq: Every day | ORAL | 0 refills | Status: AC
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
## 2024-01-22 ENCOUNTER — Ambulatory Visit (INDEPENDENT_AMBULATORY_CARE_PROVIDER_SITE_OTHER): Admitting: Internal Medicine

## 2024-01-22 ENCOUNTER — Encounter: Payer: Self-pay | Admitting: Internal Medicine

## 2024-01-22 VITALS — BP 118/74 | HR 75 | Ht 59.0 in

## 2024-01-22 DIAGNOSIS — E042 Nontoxic multinodular goiter: Secondary | ICD-10-CM | POA: Diagnosis not present

## 2024-01-22 DIAGNOSIS — J9859 Other diseases of mediastinum, not elsewhere classified: Secondary | ICD-10-CM | POA: Diagnosis not present

## 2024-01-22 LAB — TSH: TSH: 0.41 m[IU]/L (ref 0.40–4.50)

## 2024-01-22 NOTE — Patient Instructions (Signed)
Please stop at the lab.  Please come back in 1 year. 

## 2024-01-22 NOTE — Progress Notes (Signed)
 Patient ID: [name], female   DOB: 07-Dec-1960, 63 y.o.   MRN: [id number] Note precharted 01/10/2024.  HPI  [name] is a 63 y.o.-year-old female, initially referred by her PCP, Arloa Jarvis, NP, returning for follow-up for thyroid  nodules.  Last visit 1 year ago.  Interim history: She feels well, but she is in a wheelchair today as she has increased pain especially in the right leg. She had steroid inj's in her shoulder and also in hip/knee 2-3 mo ago. She has palpitations, no tremors, occasional anxiety.  Reviewed and addended history: 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 She was followed for this by surgery reportedly (no records available for review) with imaging every 3-6 mo. Since no growth was detected >> she did not have surgery.  She was also reported to have a mediastinal mass apparently connected to the thyroid  on 2 CT angio studies from 2021 (3 x 2.5 cm in size).  She also has a history of PE in 2008.  She presented with chest pain and cough and was again investigated for PE via a CT chest in 02/2021 that showed a retrosternal likely right thyroid  lobe nodule.  Upon questioning, she does mention she noticed pressure in neck and neck swelling 1 year prior to this finding, but this improved  Reviewed available reports/images/pathology results: Chest CT angio (08/14/2019):  Enlarged, heterogeneous and multinodular thyroid  including a nodule which extends inferiorly from the right thyroid  lobe into the thoracic inlet measuring up to 2.3 cm in size.   Chest CT angio (09/25/2019): Thyroid  remains enlarged with a dominant mass arising from the right lobe of the thyroid  measuring 3.0 x 2.5 cm. Thyroid  otherwise appears somewhat inhomogeneous. There is no appreciable thoracic adenopathy. Small hiatal hernia noted.  Thyroid  U/S (05/27/2020): Parenchymal Echotexture: Moderately heterogenous   Isthmus: 0.7 cm  Right lobe: 6.5 x 3 1 x 2.8 cm  Left lobe: 4.5 x 1.6 x 1.5 cm  _________________________________________________________   Nodule # 1:  Location: Right; Inferior  Maximum size: 3.8 cm; Other 2 dimensions: 3.1 x 2.8 cm  Composition: solid/almost completely solid (2)  Echogenicity: isoechoic (1)  **Given size (>/= 2.5 cm) and appearance, fine needle aspiration of this mildly suspicious nodule should be considered based on TI-RADS criteria.  _________________________________________________________   IMPRESSION: Right inferior solid thyroid  nodule (versus pseudonodule given diffusely heterogeneous thyroid  parenchyma) measuring up to approximately 3.8 cm, mildly suspicious for malignancy (TI-RADS category 3). This lesion meets criteria for tissue sampling. Recommend ultrasound guided fine needle aspiration.  FNA (11/04/2020): Clinical History: Right inferior 3.8cm; Other 2 dimensions: 3.1 x 2.8cm,  Solid / almost completely solid, Isoechoic, TI-RADS total points 3  Specimen Submitted:  A. THYROID , RLP, FINE NEEDLE ASPIRATION:   FINAL MICROSCOPIC DIAGNOSIS:  - Consistent with benign follicular nodule (Bethesda category II)   SPECIMEN ADEQUACY:  Satisfactory for evaluation   Chest CT with and without contrast (03/07/2021):  Stable, rounded, retrosternal density that is slightly exophytic from the large right thyroid  lobe.   Previously seen 3.4 cm right thyroid  nodule was not visible anymore.  Thyroid  U/S (06/10/2021): Parenchymal Echotexture: Moderately heterogenous Isthmus: 0.7 cm  Right lobe: 6.2 x 2.3 x 2.5 cm  Left lobe: 4.1 x 1.3 x 2.0 cm  _________________________________________________________   Diffusely heterogeneous thyroid  gland with extensive goitrous change. Multiple small thyroid  nodules are identified in bilaterally measuring less than 1 cm. These are considered  incidental and warrant no further follow-up.   Nodule # 3: Approximately 1.3  cm isoechoic solid nodule in the right mid gland does not meet criteria to warrant further evaluation.   Nodule # 4: Previously biopsied mass in the lower pole of the right gland measures 3.1 x 3.0 x 3.0 cm. This is slightly smaller than 3.1 x 3.1 x 2.8 cm measured previously.   No new nodules or suspicious features.   IMPRESSION: 1. Similar appearance of diffusely enlarged, heterogeneous and multinodular thyroid  gland most consistent with multinodular goiter. 2. Slight interval decrease in size of previously biopsied mass in the lower pole of the right gland. 3. No new nodules or suspicious features identified.  CT chest (PE protocol, 11/01/2022): Mediastinum/Nodes: No hilar or mediastinal adenopathy. The esophagus is grossly unremarkable. Asymmetric enlargement of the right thyroid  lobe with a 3.7 cm right lobe nodule. This has been evaluated on previous imaging. (ref: J Am Coll Radiol. 2015 Feb;12(2): 143-50).No mediastinal fluid collection.  Thyroid  U/S (12/07/2022): Parenchymal Echotexture: Mildly heterogenous  Isthmus: 0.6 cm  Right lobe: 6.5 cm x 2.4 cm x 3.0 cm  Left lobe: 4.9 cm x 1.7 cm x 1.9 cm  _________________________________________________________   Estimated total number of nodules >/= 1 cm: 1  _______________________________________________________   Nodule labeled 1 superior right thyroid , 7 mm. Nodule does not meet criteria for surveillance.   Nodule labeled 2 inferior right thyroid , 4.0 cm relatively unchanged. Nodule has undergone prior biopsy 06/09/2021. Assuming benign result no further specific follow-up would be indicated.   No adenopathy   IMPRESSION: Similar appearance of thyroid , including relatively unchanged nodule inferior right thyroid  gland. This nodule has undergone prior biopsy and assuming benign result no further specific follow-up would be indicated.  She denies: - feeling nodules in neck - choking - hoarseness - SOB with lying  down She has occasional dysphagia with pill - chronic.  I reviewed pt's thyroid  tests: Lab Results  Component Value Date   TSH 0.746 11/07/2022  Prev.: Lab Results  Component Value Date   TSH 0.506 04/23/2021   TSH 0.458 03/05/2019   TSH 0.638 11/20/2017   TSH 0.112 (L) 05/24/2016   TSH 0.345 (L) 06/18/2014   TSH 0.167 (L) 10/28/2012   TSH 0.218 (L) 01/09/2012   TSH 0.681 08/06/2007   TSH 0.559 * 04/05/2007   TSH 0.449 01/16/2007    FREET4 1.39 11/20/2017   FREET4 1.32 08/06/2007    + FH of thyroid  ds. - in sister (? Condition). No known FH of thyroid  cancer. No h/o radiation tx to head or neck. No recent contrast studies. No steroid use. No herbal supplements. No Biotin supplements or Hair, Skin and Nails vitamins.  Pt. has a history of DM2 -on Ozempic  previously, but currently on metformin  and Jardiance . This is managed by PCP.  Latest HbA1c reviewed: Lab Results  Component Value Date   HGBA1C 6.5 (H) 10/09/2023   Pt. also has a history of CAD, dCHF, h/o TIA, HL, HTN, OSA (on CPAP), glaucoma, h/o PE in 2008, depression. She was prev. [facility]. She was diagnosed with invasive ductal breast cancer -per biopsy 11/16/2021. She had resection. She had RAI tx and ChTx. She was in the emergency room with atypical chest pain x 2, in 10/12/2022 and 11/11/2022.  She was briefly admitted with chest pain in 06/2022. She saw cardiology had a 2D echo which showed an LVEF of 50 to 55%. She was started on Spironolactone .  ROS: + See HPI  Past Medical History:  Diagnosis Date   Allergy    Anginal pain    a. NL cath in 2008;  b. Myoview  03/2011: dec uptake along mid anterior wall on stress imaging -> ? attenuation vs. ischemia, EF 65%;  c. Echo 04/2011: EF 55-60%, no RWMA, Gr 2 dd   Anxiety    Arthritis    Asthma    Back pain    Bone cancer (HCC)    Cancer (HCC)    Chest pain    CHF (congestive heart failure) (HCC)    Clotting disorder    Constipation     Depression    Diabetes mellitus    Drug use    Dyspnea    Frequent urination    GERD (gastroesophageal reflux disease)    Glaucoma    History of stomach ulcers    HLD (hyperlipidemia)    Hypertension    IBS (irritable bowel syndrome)    Joint pain    Joint pain    Lactose intolerance    Leg edema    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 2008 -> coumadin x 6 mos.   Rheumatoid arthritis (HCC)    Sleep apnea    on CPAP 02/2018   SOB (shortness of breath)    Thyroid  disease    TIA (transient ischemic attack)    Urinary urgency    Vitamin D  deficiency    Past Surgical History:  Procedure Laterality Date   ABDOMINAL HYSTERECTOMY  2005   APPENDECTOMY     BREAST LUMPECTOMY WITH RADIOACTIVE SEED AND SENTINEL LYMPH NODE BIOPSY Right 11/16/2021   Procedure: RIGHT BREAST RADIOACTIVE SEED LOCALIZED LUMPECTOMY WITH RIGHT AXILLARY SENTINEL LYMPH NODE BIOPSY;  Surgeon: Belinda Cough, MD;  Location: [facility];  Service: General;  Laterality: Right;  LMA & PEC BLOCK   CARDIAC CATHETERIZATION     Normal   CARDIAC CATHETERIZATION N/A 09/30/2014   Procedure: Left Heart Cath and Coronary Angiography;  Surgeon: Ozell Fell, MD;  Location: [facility] INVASIVE CV LAB;  Service: Cardiovascular;  Laterality: N/A;   LAPAROSCOPIC APPENDECTOMY N/A 06/03/2012   Procedure: APPENDECTOMY LAPAROSCOPIC;  Surgeon: Jina Nephew, MD;  Location: [facility] OR;  Service: General;  Laterality: N/A;   Left knee surgery  2008   LEG SURGERY     RIGHT/LEFT HEART CATH AND CORONARY ANGIOGRAPHY N/A 01/10/2023   Procedure: RIGHT/LEFT HEART CATH AND CORONARY ANGIOGRAPHY;  Surgeon: Wendel Lurena POUR, MD;  Location: [facility] INVASIVE CV LAB;  Service: Cardiovascular;  Laterality: N/A;   TONSILLECTOMY     TOTAL HIP ARTHROPLASTY Left 06/11/2018   Procedure: LEFT TOTAL HIP  ARTHROPLASTY ANTERIOR APPROACH;  Surgeon: Addie Cordella Hamilton, MD;  Location: [facility] OR;  Service: Orthopedics;  Laterality: Left;   TOTAL KNEE ARTHROPLASTY Left 08/22/2016   Procedure: TOTAL KNEE ARTHROPLASTY;  Surgeon: Addie Hamilton Cordella, MD;  Location: [facility] OR;  Service: Orthopedics;  Laterality: Left;   TUBAL LIGATION  1989   Social History   Socioeconomic History   Marital status: Divorced    Spouse name: Not on file   Number of children: 3   Years of education: Not on file   Highest education level: Not on file  Occupational History   Occupation: UNEMPLOYED    Employer: DISABLED  Tobacco Use   Smoking status: Former    Current packs/day: 0.00    Average packs/day: 0.5 packs/day for 15.0 years (7.5 ttl pk-yrs)    Types: Cigarettes    Start date: 04/24/1970    Quit date: 04/24/1985    Years since quitting: 38.7   Smokeless tobacco: Never  Vaping Use   Vaping status: Never Used  Substance and Sexual Activity   Alcohol use: No    Alcohol/week: 0.0 standard drinks of alcohol   Drug use: Not Currently    Types: Marijuana   Sexual activity: Not Currently    Birth control/protection: Surgical    Comment: hyst  Other Topics Concern   Not on file  Social History Narrative   Lives in [place] by herself.  Disabled.  Occasional Tea .  3 kids, 6 grandkids.     Water aerobics 3 times a week.   Social Drivers of Corporate investment banker Strain: High Risk (03/27/2022)   Overall Financial Resource Strain (CARDIA)    Difficulty of Paying Living Expenses: Very hard  Food Insecurity: Food Insecurity Present (03/27/2022)   Hunger Vital Sign    Worried About Running Out of Food in the Last Year: Sometimes true    Ran Out of Food in the Last Year: Sometimes true  Transportation Needs: No Transportation Needs (03/27/2022)   PRAPARE - Administrator, Civil Service (Medical): No    Lack of Transportation (Non-Medical): No  Physical Activity: Not on file  Stress: Not on file  Social  Connections: Not on file  Intimate Partner Violence: Not At Risk (01/18/2024)   Received from [facility]   HITS    Over the last 12 months how often did your partner physically hurt you?: Never    Over the last 12 months how often did your partner insult you or talk down to you?: Never    Over the last 12 months how often did your partner threaten you with physical harm?: Never    Over the last 12 months how often did your partner scream or curse at you?: Never   Current Outpatient Medications on File Prior to Visit  Medication Sig Dispense Refill   ACCU-CHEK GUIDE test strip      Accu-Chek Softclix Lancets lancets SMARTSIG:Topical     albuterol  (PROVENTIL  HFA;VENTOLIN  HFA) 108 (90 Base) MCG/ACT inhaler Inhale 2 puffs into the lungs every 6 (six) hours as needed for wheezing or shortness of breath.     albuterol  (VENTOLIN  HFA) 108 (90 Base) MCG/ACT inhaler Inhale 1-2 puffs into the lungs every 6 (six) hours as needed for wheezing or shortness of breath. 1 each 0   aspirin  81 MG chewable tablet Chew 81 mg by mouth daily.     atorvastatin  (LIPITOR) 40 MG tablet Take 40 mg by mouth at bedtime.      BIOTIN PO Take 1 tablet by mouth at bedtime.     Blood Glucose Monitoring Suppl (ACCU-CHEK GUIDE ME) w/Device KIT daily.     busPIRone  (BUSPAR ) 30 MG tablet Take 30 mg by mouth 2 (two) times daily.     carvedilol  (COREG ) 3.125 MG tablet Take 1 tablet (3.125 mg total) by mouth 2 (two) times daily. 180 tablet 3   clindamycin  (CLEOCIN ) 150 MG capsule TAKE 4 CAPSULES 1 HOUR PRIOR TO PROCEDURE     cyclobenzaprine  (FLEXERIL ) 10 MG tablet Take 1 tablet (10 mg total) by mouth 2 (two) times daily as needed for muscle spasms. 20 tablet 0  diclofenac  Sodium (VOLTAREN ) 1 % GEL Apply 2 g topically 4 (four) times daily as needed (joint/muscle pain).     empagliflozin  (JARDIANCE ) 10 MG TABS tablet Take 1 tablet (10 mg total) by mouth daily. 30 tablet 0   EPINEPHrine  0.3 mg/0.3 mL IJ SOAJ injection Inject 0.3 mg  into the muscle as needed for anaphylaxis. Follow package instructions as needed for severe allergy or anaphylactic reaction. 2 each 3   fluticasone  furoate-vilanterol (BREO ELLIPTA ) 100-25 MCG/ACT AEPB Inhale 1 puff into the lungs daily. 1 each 5   furosemide  (LASIX ) 40 MG tablet Take 1 tablet (40 mg total) by mouth daily as needed for edema or fluid (take in case of weight gain 2 to 3 lbs in 24 hrs or 5 lbs in 7 days.). 30 tablet 0   hydrocortisone 2.5 % cream SMARTSIG:sparingly Topical Twice Daily PRN     hydrOXYzine  (VISTARIL ) 25 MG capsule Take 25 mg by mouth 3 (three) times daily as needed for itching.     ibuprofen  (ADVIL ) 800 MG tablet TAKE 1 TABLET BY MOUTH FOUR TIMES A DAY AS NEEDED FOR PAIN     ipratropium-albuterol  (DUONEB) 0.5-2.5 (3) MG/3ML SOLN Take 3 mLs by nebulization every 6 (six) hours as needed (shortness of breath).     KLOR-CON  M10 10 MEQ tablet Take 10 mEq by mouth 2 (two) times daily.     lidocaine  (LIDODERM ) 5 % APPLY 1 PATCH BY TOPICAL ROUTE ONCE DAILY (MAY WEAR UP TO 12HOURS.)     lisinopril  (ZESTRIL ) 10 MG tablet TAKE 1 PILL IN THE MORNING FOR HYPERTENSION.     loperamide  (IMODIUM ) 2 MG capsule Take 1 capsule (2 mg total) by mouth 4 (four) times daily as needed for diarrhea or loose stools. 12 capsule 0   meloxicam  (MOBIC ) 15 MG tablet Take 15 mg by mouth daily.     metFORMIN  (GLUCOPHAGE -XR) 500 MG 24 hr tablet Take 1 tablet (500 mg total) by mouth daily with breakfast. 30 tablet 0   mupirocin ointment (BACTROBAN) 2 % Apply 1 Application topically 2 (two) times daily.     nitroGLYCERIN  (NITROSTAT ) 0.3 MG SL tablet Place 0.3 mg under the tongue every 5 (five) minutes as needed for chest pain.     nitroGLYCERIN  (NITROSTAT ) 0.4 MG SL tablet PLACE 1 TABLET UNDER THE TONGUE EVERY 5 MINUTES AS NEEDED FOR CHEST PAIN.     nystatin (MYCOSTATIN/NYSTOP) powder Apply 1 application topically 3 (three) times daily as needed for rash.     ondansetron  (ZOFRAN ) 8 MG tablet PLEASE SEE  ATTACHED FOR DETAILED DIRECTIONS     ondansetron  (ZOFRAN -ODT) 4 MG disintegrating tablet Take 1 tablet (4 mg total) by mouth every 8 (eight) hours as needed for nausea or vomiting. 20 tablet 0   oxyCODONE  (OXY IR/ROXICODONE ) 5 MG immediate release tablet Take 1 tablet by mouth every 6 (six) hours as needed.     pantoprazole  (PROTONIX ) 40 MG tablet Take 40 mg by mouth daily.     RYBELSUS 7 MG TABS TAKE 1 TABLET BY MOUTH IN THE MORNING DAILY FOR DIABETES.     sertraline  (ZOLOFT ) 25 MG tablet Take 1 tablet (25 mg total) by mouth daily. 90 tablet 0   spironolactone  (ALDACTONE ) 25 MG tablet Take 25 mg by mouth every other day.     triamcinolone  ointment (KENALOG ) 0.1 % APPLY A THIN LAYER TO THE AFFECTED AREA(S) ON LEFT ARM BY TOPICAL ROUTE 2 TIMES PER DAY FOR 7 DAYS     Vitamin D , Ergocalciferol , (DRISDOL )  1.25 MG (50000 UNIT) CAPS capsule Take 1 capsule (50,000 Units total) by mouth every 7 (seven) days. 4 capsule 0   No current facility-administered medications on file prior to visit.   Allergies  Allergen Reactions   Shellfish Allergy Hives and Anaphylaxis   Hydrocodone -Acetaminophen  Itching and Other (See Comments)    confusion Mental Status Changes   Penicillins Swelling and Other (See Comments)   Gabapentin      Not available   Latex Rash   Sulfa Antibiotics Diarrhea, Itching and Rash   Family History  Problem Relation Age of Onset   Emphysema Mother    Arthritis Mother    Heart failure Mother        alive @ 47   Stroke Mother    Diabetes Mother    Hypertension Mother    Hyperlipidemia Mother    Depression Mother    Anxiety disorder Mother    Heart disease Mother    Alcoholism Mother    Depression Father    Heart disease Father        died @ 68's.   Stroke Father    Diabetes Father    Hyperlipidemia Father    Hypertension Father    Bipolar disorder Father    Sleep apnea Father    Alcoholism Father    Drug abuse Father    Sudden death Father    Diabetes Sister     Asthma Brother    Colon cancer Maternal Grandfather        dx after 50   Heart disease Paternal Grandfather    Breast cancer Maternal Aunt 59 - 40   Breast cancer Cousin        dx 72s   Asthma Daughter    Esophageal cancer Neg Hx    Stomach cancer Neg Hx    Rectal cancer Neg Hx    PE: BP 118/74   Pulse 75   Ht 4' 11 (1.499 m)   SpO2 95%   BMI 39.99 kg/m  Wt Readings from Last 3 Encounters:  01/21/24 198 lb (89.8 kg)  12/20/23 203 lb (92.1 kg)  12/13/23 207 lb (93.9 kg)   Constitutional: overweight, in NAD, in wheelchair Eyes: EOMI, no exophthalmos ENT: + R thyromegaly, no cervical lymphadenopathy Cardiovascular: RRR, No MRG Respiratory: CTA B Musculoskeletal: no deformities Skin: no rashes Neurological: no tremor with outstretched hands  ASSESSMENT: 1. Thyroid  nodules  2.  Mediastinal mass  PLAN: 1. Thyroid  nodules -Patient with a history of multiple thyroid  nodules, of which the dominant nodule was found in the right gland and extending towards mediastinum.  This measured 3.8 cm initially but then involuted to 3.1 cm.  We biopsied this nodule 2 years ago with benign results. -She also has several smaller nodules which appear to be low risk and one R mid thyroid  nodule that had several benign features: Isoechoic, without microcalcifications or internal blood flow, more wide than tall, and with regular margins. -Also, the patient does not have thyroid  cancer or family history or personal history of radiation therapy to head or neck, favoring benignity -In 10/2022, she had a neck CT showing asymmetric enlargement of the right thyroid  with a 3.7 cm nodule.  We discussed that CT measurements were not as accurate as ultrasound measurements.  We checked another thyroid  ultrasound on 12/07/2022 and this showed relative stability of the nodule (measuring 4 cm in the largest dimension) without further imaging necessary - At today's visit, she continues to have mild pill dysphagia,  which  not new and it is intermittent. - No intervention needed for now - Latest TFTs were reviewed and they were normal in 10/2022.  Will repeat the TSH today. - I will see her back in 1 year  2.  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

## 2024-01-23 ENCOUNTER — Telehealth: Payer: Self-pay

## 2024-01-23 ENCOUNTER — Ambulatory Visit: Payer: Self-pay | Admitting: Internal Medicine

## 2024-01-23 ENCOUNTER — Telehealth: Payer: Self-pay | Admitting: Adult Health

## 2024-01-23 NOTE — Telephone Encounter (Signed)
 Copied from CRM [id number]. Topic: General - Call Back - No Documentation >> Jan 23, 2024 11:21 AM Leila C wrote: Reason for CRM: Amado from [employer] [phone] states has patient compliance report, usure whom she spoke with either Dr. Chalice or Dr. Magdaleno office. Informed Amado, this is not Dr. Lionell office and Dr. Shellia is not longer in this practice. Amado has not further information and states will wait for the office to contact her again. Please advise.    ----------------------------------------------------------------------- From previous Reason for Contact - Other: Reason for CRM:

## 2024-01-23 NOTE — Telephone Encounter (Signed)
 Pt is scheduled to see Madelin Stank, NP for a f/u. I was unable to find pt in airview. I called [employer] [employer] and spoke to [facility] who stated the pt's SD card will have to be brought in to get a download as the pt's machine is not connected to their system. I called and spoke to pt. I asked the pt if she would bring in her SD card or her machine if she was unsure if she had one. Pt verbalized she would. NFN

## 2024-01-24 ENCOUNTER — Encounter: Payer: Self-pay | Admitting: Adult Health

## 2024-01-24 ENCOUNTER — Ambulatory Visit: Admitting: Adult Health

## 2024-01-24 VITALS — BP 128/70 | HR 83 | Temp 97.6°F | Ht <= 58 in | Wt 206.6 lb

## 2024-01-24 DIAGNOSIS — Z23 Encounter for immunization: Secondary | ICD-10-CM

## 2024-01-24 DIAGNOSIS — G4733 Obstructive sleep apnea (adult) (pediatric): Secondary | ICD-10-CM | POA: Diagnosis not present

## 2024-01-24 DIAGNOSIS — J4530 Mild persistent asthma, uncomplicated: Secondary | ICD-10-CM

## 2024-01-24 DIAGNOSIS — R918 Other nonspecific abnormal finding of lung field: Secondary | ICD-10-CM

## 2024-01-24 DIAGNOSIS — Z6841 Body Mass Index (BMI) 40.0 and over, adult: Secondary | ICD-10-CM

## 2024-01-24 DIAGNOSIS — R0602 Shortness of breath: Secondary | ICD-10-CM

## 2024-01-24 DIAGNOSIS — G4730 Sleep apnea, unspecified: Secondary | ICD-10-CM

## 2024-01-24 NOTE — Telephone Encounter (Signed)
 [name], CMA  Joylene Carlean Sprung, Patricia; Ziegler, Melissa; Tucker, Dolanda; Cain, Dante New orders have been placed for the above pt, DOB: 03/28/2061 Thanks

## 2024-01-24 NOTE — Progress Notes (Signed)
 @Patient  ID: [name], female    DOB: 06/04/1960, 63 y.o.   MRN: [id number]  Chief Complaint  Patient presents with   Obstructive Sleep Apnea    Referring provider: No ref. provider found  HPI: 63 year old female former smoker seen for consult October 01, 2023 for dyspnea and sleep apnea    TEST/EVENTS :  CT chest angio November 12, 2023 mild atelectasis bilaterally no focal consolidation or edema-no acute disease  2D echo December 21, 2023 showed EF at 55 to 60%, RV size is normal, right ventricular systolic function normal   Discussed the use of AI scribe software for clinical note transcription with the patient, who gave verbal consent to proceed.  History of Present Illness [name] is a 63 year old female with sleep apnea and asthma who presents for follow-up for dyspnea.  Last visit was felt to have a possible component of asthma.  She was started on Breo.  Patient does feel that the [place] has helped with her breathing she has no significant cough or wheezing.  She was set up for pulmonary function testing that was completed December 03, 2023 that showed moderate restriction with no significant airflow obstruction.  Diffusing capacity was slightly decreased.  FEV1 was 60%, ratio 79, FVC 58%, DLCO 70%.  Suspect some restriction is related to body habitus with current weight at 206 pounds with a BMI of 44.  We reviewed her pulmonary function testing in detail  She has been using a CPAP machine at night for her sleep apnea, but the machine sometimes malfunctions. She is in the process of obtaining a new one. Currently, she uses the CPAP for about four to five hours per night. A recent home sleep study was conducted, and she is following up with her neurologist regarding the CPAP machine replacement.  .     Allergies  Allergen Reactions   Shellfish Allergy Hives and Anaphylaxis   Hydrocodone -Acetaminophen  Itching and Other (See Comments)    confusion Mental Status  Changes   Penicillins Swelling and Other (See Comments)   Gabapentin      Not available   Latex Rash   Sulfa Antibiotics Diarrhea, Itching and Rash    Immunization History  Administered Date(s) Administered   Influenza Split 01/09/2012   PPD Test 08/31/2016, 09/14/2016   Pneumococcal Polysaccharide-23 01/09/2012    Past Medical History:  Diagnosis Date   Allergy    Anginal pain    a. NL cath in 2008;  b. Myoview  03/2011: dec uptake along mid anterior wall on stress imaging -> ? attenuation vs. ischemia, EF 65%;  c. 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 2008 -> coumadin x 6 mos.   Rheumatoid arthritis (HCC)    Sleep apnea    on CPAP 02/2018   SOB (shortness of breath)    Thyroid  disease    TIA (transient ischemic attack)    Urinary urgency    Vitamin D  deficiency     Tobacco History: Social History   Tobacco Use  Smoking Status Former   Current packs/day: 0.00   Average packs/day: 0.5 packs/day for 15.0 years (7.5 ttl pk-yrs)   Types: Cigarettes   Start date: 04/24/1970   Quit date: 04/24/1985   Years since quitting: 38.7  Smokeless Tobacco Never   Counseling given: Not Answered   Outpatient Medications Prior to Visit  Medication Sig Dispense Refill   ACCU-CHEK GUIDE test strip       Accu-Chek Softclix Lancets lancets SMARTSIG:Topical     albuterol  (PROVENTIL  HFA;VENTOLIN  HFA) 108 (90 Base) MCG/ACT inhaler Inhale 2 puffs into the lungs every 6 (six) hours as needed for wheezing or shortness of breath.     albuterol  (VENTOLIN  HFA) 108 (90 Base) MCG/ACT inhaler Inhale 1-2 puffs into the lungs every 6 (six) hours as needed for wheezing or shortness of breath. 1 each 0   aspirin  81 MG chewable tablet Chew 81 mg by mouth daily.     atorvastatin  (LIPITOR) 40 MG tablet Take 40 mg by mouth at bedtime.      BIOTIN PO Take 1 tablet by mouth at bedtime.     Blood Glucose Monitoring Suppl (ACCU-CHEK GUIDE ME) w/Device KIT daily.     busPIRone  (BUSPAR ) 30 MG tablet Take 30 mg by mouth 2 (two) times daily.     carvedilol  (COREG ) 3.125 MG tablet Take 1 tablet (3.125 mg total) by mouth 2 (two) times daily. 180 tablet 3   clindamycin  (CLEOCIN ) 150 MG capsule TAKE 4 CAPSULES 1 HOUR PRIOR TO PROCEDURE     cyclobenzaprine  (FLEXERIL ) 10 MG tablet Take 1 tablet (10 mg total) by mouth 2 (two) times daily as needed for muscle spasms. 20 tablet 0   diclofenac  Sodium (VOLTAREN ) 1 % GEL Apply 2 g topically 4 (four) times daily as needed (joint/muscle pain).     empagliflozin  (JARDIANCE ) 10 MG TABS tablet Take 1 tablet (10 mg total) by mouth daily. 30 tablet 0   EPINEPHrine  0.3 mg/0.3 mL IJ SOAJ injection Inject 0.3 mg into the muscle as needed for anaphylaxis. Follow package instructions as needed for severe allergy or anaphylactic reaction. 2 each 3   fluticasone  furoate-vilanterol (BREO ELLIPTA ) 100-25 MCG/ACT AEPB Inhale 1 puff into the lungs daily. 1 each 5   furosemide  (LASIX ) 40 MG tablet Take 1 tablet (40 mg total) by mouth daily as needed for edema or fluid (take in case of weight gain 2 to 3 lbs in 24 hrs or 5 lbs in 7 days.). 30 tablet 0   hydrocortisone 2.5 % cream SMARTSIG:sparingly Topical Twice Daily PRN     hydrOXYzine  (VISTARIL ) 25 MG capsule Take 25 mg by mouth 3 (three) times daily  as needed for itching.     ibuprofen  (ADVIL ) 800 MG tablet TAKE 1 TABLET BY MOUTH FOUR TIMES A DAY AS NEEDED FOR PAIN     ipratropium-albuterol  (DUONEB) 0.5-2.5 (3) MG/3ML SOLN Take 3 mLs by nebulization every 6 (six) hours as needed (shortness of  breath).     KLOR-CON  M10 10 MEQ tablet Take 10 mEq by mouth 2 (two) times daily.     lidocaine  (LIDODERM ) 5 % APPLY 1 PATCH BY TOPICAL ROUTE ONCE DAILY (MAY WEAR UP TO 12HOURS.)     lisinopril  (ZESTRIL ) 10 MG tablet TAKE 1 PILL IN THE MORNING FOR HYPERTENSION.     loperamide  (IMODIUM ) 2 MG capsule Take 1 capsule (2 mg total) by mouth 4 (four) times daily as needed for diarrhea or loose stools. 12 capsule 0   meloxicam  (MOBIC ) 15 MG tablet Take 15 mg by mouth daily.     metFORMIN  (GLUCOPHAGE -XR) 500 MG 24 hr tablet Take 1 tablet (500 mg total) by mouth daily with breakfast. 30 tablet 0   mupirocin ointment (BACTROBAN) 2 % Apply 1 Application topically 2 (two) times daily.     nitroGLYCERIN  (NITROSTAT ) 0.3 MG SL tablet Place 0.3 mg under the tongue every 5 (five) minutes as needed for chest pain.     nitroGLYCERIN  (NITROSTAT ) 0.4 MG SL tablet PLACE 1 TABLET UNDER THE TONGUE EVERY 5 MINUTES AS NEEDED FOR CHEST PAIN.     nystatin (MYCOSTATIN/NYSTOP) powder Apply 1 application topically 3 (three) times daily as needed for rash.     ondansetron  (ZOFRAN ) 8 MG tablet PLEASE SEE ATTACHED FOR DETAILED DIRECTIONS     ondansetron  (ZOFRAN -ODT) 4 MG disintegrating tablet Take 1 tablet (4 mg total) by mouth every 8 (eight) hours as needed for nausea or vomiting. 20 tablet 0   oxyCODONE  (OXY IR/ROXICODONE ) 5 MG immediate release tablet Take 1 tablet by mouth every 6 (six) hours as needed.     pantoprazole  (PROTONIX ) 40 MG tablet Take 40 mg by mouth daily.     RYBELSUS 7 MG TABS TAKE 1 TABLET BY MOUTH IN THE MORNING DAILY FOR DIABETES.     sertraline  (ZOLOFT ) 25 MG tablet Take 1 tablet (25 mg total) by mouth daily. 90 tablet 0   spironolactone  (ALDACTONE ) 25 MG tablet  Take 25 mg by mouth every other day.     triamcinolone  ointment (KENALOG ) 0.1 % APPLY A THIN LAYER TO THE AFFECTED AREA(S) ON LEFT ARM BY TOPICAL ROUTE 2 TIMES PER DAY FOR 7 DAYS     Vitamin D , Ergocalciferol , (DRISDOL ) 1.25 MG (50000 UNIT) CAPS capsule Take 1 capsule (50,000 Units total) by mouth every 7 (seven) days. 4 capsule 0   No facility-administered medications prior to visit.     Review of Systems:   Constitutional:   No  weight loss, night sweats,  Fevers, chills, fatigue, or  lassitude.  HEENT:   No headaches,  Difficulty swallowing,  Tooth/dental problems, or  Sore throat,                No sneezing, itching, ear ache, nasal congestion, post nasal drip,   CV:  No chest pain,  Orthopnea, PND, swelling in lower extremities, anasarca, dizziness, palpitations, syncope.   GI  No heartburn, indigestion, abdominal pain, nausea, vomiting, diarrhea, change in bowel habits, loss of appetite, bloody stools.   Resp:    No chest wall deformity  Skin: no rash or lesions.  GU: no dysuria, change in color of urine, no urgency or frequency.  No flank pain, no hematuria   MS:  No joint pain or swelling.  No decreased range of motion.  No back pain.    Physical Exam  BP 128/70   Pulse 83   Temp 97.6 F (36.4 C)   Ht 4' 9 (1.448 m)  Wt 206 lb 9.6 oz (93.7 kg)   SpO2 95% Comment: RA  BMI 44.71 kg/m   GEN: A/Ox3; pleasant , NAD, well nourished    HEENT:  [place]/AT,   NOSE-clear, THROAT-clear, no lesions, no postnasal drip or exudate noted.   NECK:  Supple w/ fair ROM; no JVD; normal carotid impulses w/o bruits; no thyromegaly or nodules palpated; no lymphadenopathy.    RESP  Clear  P & A; w/o, wheezes/ rales/ or rhonchi. no accessory muscle use, no dullness to percussion  CARD:  RRR, no m/r/g, no peripheral edema, pulses intact, no cyanosis or clubbing.  GI:   Soft & nt; nml bowel sounds; no organomegaly or masses detected.   Musco: Warm bil, no deformities or joint swelling  noted.   Neuro: alert, no focal deficits noted.    Skin: Warm, no lesions or rashes    Lab Results:  CBC    Component Value Date/Time   WBC 5.3 11/12/2023 1615   RBC 4.35 11/12/2023 1615   HGB 13.0 11/12/2023 1615   HGB 13.7 01/04/2023 1027   HCT 40.3 11/12/2023 1615   HCT 42.7 01/04/2023 1027   PLT 195 11/12/2023 1615   PLT 189 01/04/2023 1027   MCV 92.6 11/12/2023 1615   MCV 89 01/04/2023 1027   MCH 29.9 11/12/2023 1615   MCHC 32.3 11/12/2023 1615   RDW 13.2 11/12/2023 1615   RDW 13.1 01/04/2023 1027   LYMPHSABS 1.5 04/18/2023 0159   LYMPHSABS 1.3 01/04/2023 1027   MONOABS 0.4 04/18/2023 0159   EOSABS 0.1 04/18/2023 0159   EOSABS 0.1 01/04/2023 1027   BASOSABS 0.0 04/18/2023 0159   BASOSABS 0.0 01/04/2023 1027    BMET    Component Value Date/Time   NA 139 11/12/2023 1615   NA 143 10/09/2023 0828   K 3.8 11/12/2023 1615   CL 107 11/12/2023 1615   CO2 24 11/12/2023 1615   GLUCOSE 134 (H) 11/12/2023 1615   BUN 18 11/12/2023 1615   BUN 15 10/09/2023 0828   CREATININE 1.04 (H) 11/12/2023 1615   CREATININE 0.85 03/03/2022 1250   CALCIUM  9.4 11/12/2023 1615   GFRNONAA >60 11/12/2023 1615   GFRNONAA >60 03/03/2022 1250   GFRAA 76 02/03/2020 0845    BNP    Component Value Date/Time   BNP 16.6 07/25/2023 0951    ProBNP    Component Value Date/Time   PROBNP 54 04/11/2022 1606   PROBNP 36.4 02/24/2014 1231    Imaging: Home sleep test Result Date: 01/08/2024 Dohmeier, Dedra, MD     01/16/2024  4:10 PM   [facility] [name] 63 year old female 1960/05/21  HOME SLEEP TEST REPORT ( by [facility] PAT)  STUDY DATE:  9-16 -25, data load on 01-10-2024   ORDERING CLINICIAN: REFERRING CLINICIAN:  FNP Smith  CLINICAL INFORMATION/HISTORY:  12-13-2023: here for revisit 12/13/2023 for a new CPAP.  I had the pleasure of seeing [name] again who I had evaluated for sleep apnea back  in November 2015.  She has been diagnosed with obstructive sleep apnea  and started CPAP, but her machine is now 64 years old.  It has started to sputter and not reliably provide positive airway pressure to her.  She was dx by a PSG , with mild apnea  of AHI 7.9 /h  and started on CPAP.  She used 16 cm water, 3 cm EPR and had reached an AHI 0.3/h. She was recently on Ozempic  and Mounjaro , couldn't tolerate  either, she started itching.  She struggles with DM and obesity and she feels better when her CPAP works well.  She is back at work for [employer] benefits, 12 hours a week.  She sleeps much better on CPAP/   Epworth sleepiness score: 6/ 24, FSS at 22/63 BMI: 43.2  kg/m Neck Circumference: 17   Sleep Summary:  Total Recording Time (hours, min):    8 hours and 26 minutes   Total Sleep Time (hours, min):     7 h and 18 minutes           Percent REM (%):    17.4 %                                  Respiratory Indices:  Calculated pAHI (per AASM guideline): 17.9/h                       REM pAHI:   36 /h                                            NREM pAHI:   14.1/h                           Positional AHI:  supine AHI was 18.7 /h and non-supine AHI was 16.3/h  Snoring:  46 dB mean volume - loud.                                             Oxygen Saturation Statistics:  Oxygen Saturation (%) Mean:  92%, between 78% and 97% and with 7.2 minutes of hypoxia-  O2 Saturation (minutes) <89%:  all hypoxia was related to supine sleep.        Pulse Rate Statistics:  Pulse Mean (bpm):   83 bpm and varied from  59 through 115 bpm                      IMPRESSION:  This HST confirms the presence of  moderate severe Obstructive sleep apnea associated with hypoxia and with loud snoring.  RECOMMENDATION: continuation of CPAP therapy is recommended.  New autotitration CPAP device by [employer] will be provided with a setting from 10-16 cm water, 3 cm EPR and heated humidifier, and mask of choice. The FDA approved weight loss medication for patients with obesity related sleep apnea ( OSA) with moderate to severe  degree of apnea. The medication can be prescribed by PCP or weight loss clinics but may still not be covered by insurance. Any patient should be cautioned not to drive, work at heights, or operate dangerous or heavy equipment when tired or sleepy. Review of good sleep hygiene measures is accessible to any sleep clinic patient and can be reiterated through online material- I we recommend the Guide to better Sleep   by the NIH. Weight loss and Core Strength improvement is highly recommended for individuals with low muscle tone and/ or a BMI over 30. Any CPAP patient should be reminded to be fully compliant with PAP therapy , (defined as using PAP therapy for more than 4 hours each night ) with  the goal to improve sleep related symptoms and long term cardiovascular risks.  Please note that untreated obstructive sleep apnea may carry additional perioperative morbidity. Patients with significant obstructive sleep apnea should receive perioperative PAP therapy and the surgical team should be informed of the diagnosis and degree of sleep disordered breathing. The referring physician will be notified of the test results.  INTERPRETING PHYSICIAN:  Tara Gores, MD [facility] Sleep Board certified by The [employer] of Sleep Medicine and Diplomate of the [employer] of Sleep Medicine. Board certified In Neurology through the ABPN, Fellow of the [employer] of Neurology.     Administration History     None          Latest Ref Rng & Units 12/03/2023    2:34 PM  PFT Results  FVC-Pre L 1.52   FVC-Predicted Pre % 58   FVC-Post L 1.33   FVC-Predicted Post % 51   Pre FEV1/FVC % % 79   Post FEV1/FCV % % 81   FEV1-Pre L 1.21   FEV1-Predicted Pre % 60   FEV1-Post L 1.07   DLCO uncorrected ml/min/mmHg 11.83   DLCO UNC% % 70   DLCO corrected ml/min/mmHg 11.98   DLCO COR %Predicted % 71   DLVA Predicted % 137   TLC L 2.90   TLC % Predicted % 67   RV %  Predicted % 80     No results found for: NITRICOXIDE      Assessment & Plan:   Assessment and Plan Assessment & Plan Mild persistent asthma - Mild persistent asthma with some pulmonary restriction noted on a recent breathing test.  She is currently on Breo and an albuterol  inhaler, which effectively manage symptoms. No significant coughing or shortness of breath reported. Continue Breo and albuterol  inhaler, ensure proper rinsing after using Breo, and encourage staying active within physical limitations. Administer flu shot.  Restrictive lung changes-suspect related to body habitus possibly some underlying asthma.  Encouraged on healthy weight loss activity as tolerated  Obstructive sleep apnea   Obstructive sleep apnea is managed with CPAP therapy. She reports issues with the current CPAP machine cutting off, leading to inconsistent use of 4-5 hours per night. A new CPAP machine has been ordered by Dr. Elza, the neurologist. Continue CPAP therapy and follow up with neurology for CPAP machine replacement.  Morbid obesity encouraged healthy weight loss  Plan  Patient Instructions  Continue on Breo 1 puff daily, rinse after use.  Albuterol  inhaler As needed   Flu shot today.  Continue follow up with Neurology for Sleep apnea Continue on CPAP At bedtime   Work on healthy weight loss  Follow up with Dr. Meade or Omega Slager NP in 6 months and As needed      Madelin Stank, NP 01/24/2024

## 2024-01-24 NOTE — Patient Instructions (Signed)
 Continue on Breo 1 puff daily, rinse after use.  Albuterol  inhaler As needed   Flu shot today.  Continue follow up with Neurology for Sleep apnea Continue on CPAP At bedtime   Work on healthy weight loss  Follow up with Dr. Meade or Koralee Wedeking NP in 6 months and As needed

## 2024-01-24 NOTE — Telephone Encounter (Signed)
 I called and spoke with RT at [facility] and pt has been added to [facility]. DL printed and given to Ashlyn.

## 2024-01-25 NOTE — Progress Notes (Deleted)
 CARDIOLOGY CONSULT NOTE       Patient ID: [name] MRN: [id number] DOB/AGE: 1960-11-08 63 y.o.  Primary Physician: Arloa Jarvis, NP Primary Cardiologist: Delford   HPI: 63 y.o. admitted 07/15/22 for chest pain. History of HTN, DM-2, OSA on CPAP history of PE 2008 and breast cancer has had right breast swelling from XRT and ? Lymphedema Central substernal chest pressure 24 hours with nausea Symptoms not exertional worse with deep breath No change in nitro ECG no acute changes chronic nonspecific inferior lateral ST changes CTA negative for PE or acute thoracic pathology some dermal thickening over right breast Troponin negative x 2 Rx with lidoderm  patch and oxycodone  in ER    Prior multiple normal heart caths most recently 2016 Normal myovue 04/11/19 TTE 03/24/22 EF 60-65% normal RV and no significant valve dx  Cath again 01/10/23 with only 25% mid LAD stenosis Normal LVEDP mean PA 28 mmHg and normal CO 6.5 L/min  Echo 12/01/23 EF 55-60% normal diastolic function Normal RV no significant valve dx  Seen in ED 11/12/23 with chest pain and dizziness. No help with nitroglycerin  R/O CTA negative for PE/dissection troponin negative sometimes uses a wheelchair due to dizziness   She has some right UE lymphedema from prior breast cancer She was on mounjaro  and changed to metformin  for DM.   ROS All other systems reviewed and negative except as noted above  Past Medical History:  Diagnosis Date   Allergy    Anginal pain    a. NL cath in 2008;  b. Myoview  03/2011: dec uptake along mid anterior wall on stress imaging -> ? attenuation vs. ischemia, EF 65%;  c. 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 2008 -> coumadin x 6 mos.   Rheumatoid arthritis (HCC)    Sleep apnea    on CPAP 02/2018   SOB (shortness of breath)    Thyroid  disease    TIA (transient ischemic attack)    Urinary urgency    Vitamin D  deficiency     Family History  Problem Relation Age of Onset   Emphysema Mother    Arthritis Mother    Heart failure Mother        alive @ 82   Stroke Mother    Diabetes Mother    Hypertension Mother    Hyperlipidemia Mother    Depression Mother    Anxiety disorder Mother    Heart disease Mother    Alcoholism  Mother    Depression Father    Heart disease Father        died @ 67's.   Stroke Father    Diabetes Father    Hyperlipidemia Father    Hypertension Father    Bipolar disorder Father    Sleep apnea Father    Alcoholism Father    Drug abuse Father    Sudden death Father    Diabetes Sister    Asthma Brother    Colon cancer Maternal Grandfather        dx after 50   Heart disease Paternal Grandfather    Breast cancer Maternal Aunt 50 - 40   Breast cancer Cousin        dx 83s   Asthma Daughter    Esophageal cancer Neg Hx    Stomach cancer Neg Hx    Rectal cancer Neg Hx     Social History   Socioeconomic History   Marital status: Divorced    Spouse name: Not on file   Number of children: 3   Years of education: Not on file   Highest education level: Not on file  Occupational History   Occupation: UNEMPLOYED    Employer: DISABLED  Tobacco Use   Smoking status: Former    Current packs/day: 0.00    Average packs/day: 0.5 packs/day for 15.0 years (7.5 ttl  pk-yrs)    Types: Cigarettes    Start date: 04/24/1970    Quit date: 04/24/1985    Years since quitting: 38.7   Smokeless tobacco: Never  Vaping Use   Vaping status: Never Used  Substance and Sexual Activity   Alcohol use: No    Alcohol/week: 0.0 standard drinks of alcohol   Drug use: Not Currently    Types: Marijuana   Sexual activity: Not Currently    Birth control/protection: Surgical    Comment: hyst  Other Topics Concern   Not on file  Social History Narrative   Lives in [place] by herself.  Disabled.  Occasional Tea .  3 kids, 6 grandkids.     Water aerobics 3 times a week.   Social Drivers of Corporate investment banker Strain: High Risk (03/27/2022)   Overall Financial Resource Strain (CARDIA)    Difficulty of Paying Living Expenses: Very hard  Food Insecurity: Food Insecurity Present (03/27/2022)   Hunger Vital Sign    Worried About Running Out of Food in the Last Year: Sometimes true    Ran Out of Food in the Last Year: Sometimes true  Transportation Needs: No Transportation Needs (03/27/2022)   PRAPARE - Administrator, Civil Service (Medical): No    Lack of Transportation (Non-Medical): No  Physical Activity: Not on file  Stress: Not on file  Social Connections: Not on file  Intimate Partner Violence: Not At Risk (01/18/2024)   Received from [facility]   HITS    Over the last 12 months how often did your partner physically hurt you?: Never    Over the last 12 months how often did your partner insult you or talk down to you?: Never    Over the last 12 months how often did your partner threaten you with physical harm?: Never    Over the last 12 months how often did your partner scream or curse at you?: Never    Past Surgical History:  Procedure Laterality Date   ABDOMINAL HYSTERECTOMY  2005   APPENDECTOMY     BREAST LUMPECTOMY WITH RADIOACTIVE SEED  AND SENTINEL LYMPH NODE BIOPSY Right 11/16/2021   Procedure: RIGHT BREAST RADIOACTIVE SEED LOCALIZED  LUMPECTOMY WITH RIGHT AXILLARY SENTINEL LYMPH NODE BIOPSY;  Surgeon: Belinda Cough, MD;  Location: [facility];  Service: General;  Laterality: Right;  LMA & PEC BLOCK   CARDIAC CATHETERIZATION     Normal   CARDIAC CATHETERIZATION N/A 09/30/2014   Procedure: Left Heart Cath and Coronary Angiography;  Surgeon: Ozell Fell, MD;  Location: [facility] INVASIVE CV LAB;  Service: Cardiovascular;  Laterality: N/A;   LAPAROSCOPIC APPENDECTOMY N/A 06/03/2012   Procedure: APPENDECTOMY LAPAROSCOPIC;  Surgeon: Jina Nephew, MD;  Location: [facility] OR;  Service: General;  Laterality: N/A;   Left knee surgery  2008   LEG SURGERY     RIGHT/LEFT HEART CATH AND CORONARY ANGIOGRAPHY N/A 01/10/2023   Procedure: RIGHT/LEFT HEART CATH AND CORONARY ANGIOGRAPHY;  Surgeon: Wendel Lurena POUR, MD;  Location: [facility] INVASIVE CV LAB;  Service: Cardiovascular;  Laterality: N/A;   TONSILLECTOMY     TOTAL HIP ARTHROPLASTY Left 06/11/2018   Procedure: LEFT TOTAL HIP ARTHROPLASTY ANTERIOR APPROACH;  Surgeon: Addie Cordella Hamilton, MD;  Location: [facility];  Service: Orthopedics;  Laterality: Left;   TOTAL KNEE ARTHROPLASTY Left 08/22/2016   Procedure: TOTAL KNEE ARTHROPLASTY;  Surgeon: Addie Hamilton Cordella, MD;  Location: [facility] OR;  Service: Orthopedics;  Laterality: Left;   TUBAL LIGATION  1989      Current Outpatient Medications:    ACCU-CHEK GUIDE test strip, , Disp: , Rfl:    Accu-Chek Softclix Lancets lancets, SMARTSIG:Topical, Disp: , Rfl:    albuterol  (PROVENTIL  HFA;VENTOLIN  HFA) 108 (90 Base) MCG/ACT inhaler, Inhale 2 puffs into the lungs every 6 (six) hours as needed for wheezing or shortness of breath., Disp: , Rfl:    albuterol  (VENTOLIN  HFA) 108 (90 Base) MCG/ACT inhaler, Inhale 1-2 puffs into the lungs every 6 (six) hours as needed for wheezing or shortness of breath., Disp: 1 each, Rfl: 0   aspirin  81 MG chewable tablet, Chew 81 mg by mouth daily., Disp: , Rfl:    atorvastatin  (LIPITOR) 40 MG tablet, Take 40 mg by mouth at  bedtime. , Disp: , Rfl:    BIOTIN PO, Take 1 tablet by mouth at bedtime., Disp: , Rfl:    Blood Glucose Monitoring Suppl (ACCU-CHEK GUIDE ME) w/Device KIT, daily., Disp: , Rfl:    busPIRone  (BUSPAR ) 30 MG tablet, Take 30 mg by mouth 2 (two) times daily., Disp: , Rfl:    carvedilol  (COREG ) 3.125 MG tablet, Take 1 tablet (3.125 mg total) by mouth 2 (two) times daily., Disp: 180 tablet, Rfl: 3   clindamycin  (CLEOCIN ) 150 MG capsule, TAKE 4 CAPSULES 1 HOUR PRIOR TO PROCEDURE, Disp: , Rfl:    cyclobenzaprine  (FLEXERIL ) 10 MG tablet, Take 1 tablet (10 mg total) by mouth 2 (two) times daily as needed for muscle spasms., Disp: 20 tablet, Rfl: 0   diclofenac  Sodium (VOLTAREN ) 1 % GEL, Apply 2 g topically 4 (four) times daily as needed (joint/muscle pain)., Disp: , Rfl:    empagliflozin  (JARDIANCE ) 10 MG TABS tablet, Take 1 tablet (10 mg total) by mouth daily., Disp: 30 tablet, Rfl: 0   EPINEPHrine  0.3 mg/0.3 mL IJ SOAJ injection, Inject 0.3 mg into the muscle as needed for anaphylaxis. Follow package instructions as needed for severe allergy or anaphylactic reaction., Disp: 2 each, Rfl: 3   fluticasone  furoate-vilanterol (BREO ELLIPTA ) 100-25 MCG/ACT AEPB, Inhale 1 puff into the lungs daily., Disp: 1 each, Rfl: 5   furosemide  (LASIX ) 40 MG  tablet, Take 1 tablet (40 mg total) by mouth daily as needed for edema or fluid (take in case of weight gain 2 to 3 lbs in 24 hrs or 5 lbs in 7 days.)., Disp: 30 tablet, Rfl: 0   hydrocortisone 2.5 % cream, SMARTSIG:sparingly Topical Twice Daily PRN, Disp: , Rfl:    hydrOXYzine  (VISTARIL ) 25 MG capsule, Take 25 mg by mouth 3 (three) times daily as needed for itching., Disp: , Rfl:    ibuprofen  (ADVIL ) 800 MG tablet, TAKE 1 TABLET BY MOUTH FOUR TIMES A DAY AS NEEDED FOR PAIN, Disp: , Rfl:    ipratropium-albuterol  (DUONEB) 0.5-2.5 (3) MG/3ML SOLN, Take 3 mLs by nebulization every 6 (six) hours as needed (shortness of breath)., Disp: , Rfl:    KLOR-CON  M10 10 MEQ tablet, Take  10 mEq by mouth 2 (two) times daily., Disp: , Rfl:    lidocaine  (LIDODERM ) 5 %, APPLY 1 PATCH BY TOPICAL ROUTE ONCE DAILY (MAY WEAR UP TO 12HOURS.), Disp: , Rfl:    lisinopril  (ZESTRIL ) 10 MG tablet, TAKE 1 PILL IN THE MORNING FOR HYPERTENSION., Disp: , Rfl:    loperamide  (IMODIUM ) 2 MG capsule, Take 1 capsule (2 mg total) by mouth 4 (four) times daily as needed for diarrhea or loose stools., Disp: 12 capsule, Rfl: 0   meloxicam  (MOBIC ) 15 MG tablet, Take 15 mg by mouth daily., Disp: , Rfl:    metFORMIN  (GLUCOPHAGE -XR) 500 MG 24 hr tablet, Take 1 tablet (500 mg total) by mouth daily with breakfast., Disp: 30 tablet, Rfl: 0   mupirocin ointment (BACTROBAN) 2 %, Apply 1 Application topically 2 (two) times daily., Disp: , Rfl:    nitroGLYCERIN  (NITROSTAT ) 0.3 MG SL tablet, Place 0.3 mg under the tongue every 5 (five) minutes as needed for chest pain., Disp: , Rfl:    nitroGLYCERIN  (NITROSTAT ) 0.4 MG SL tablet, PLACE 1 TABLET UNDER THE TONGUE EVERY 5 MINUTES AS NEEDED FOR CHEST PAIN., Disp: , Rfl:    nystatin (MYCOSTATIN/NYSTOP) powder, Apply 1 application topically 3 (three) times daily as needed for rash., Disp: , Rfl:    ondansetron  (ZOFRAN ) 8 MG tablet, PLEASE SEE ATTACHED FOR DETAILED DIRECTIONS, Disp: , Rfl:    ondansetron  (ZOFRAN -ODT) 4 MG disintegrating tablet, Take 1 tablet (4 mg total) by mouth every 8 (eight) hours as needed for nausea or vomiting., Disp: 20 tablet, Rfl: 0   oxyCODONE  (OXY IR/ROXICODONE ) 5 MG immediate release tablet, Take 1 tablet by mouth every 6 (six) hours as needed., Disp: , Rfl:    pantoprazole  (PROTONIX ) 40 MG tablet, Take 40 mg by mouth daily., Disp: , Rfl:    RYBELSUS 7 MG TABS, TAKE 1 TABLET BY MOUTH IN THE MORNING DAILY FOR DIABETES., Disp: , Rfl:    sertraline  (ZOLOFT ) 25 MG tablet, Take 1 tablet (25 mg total) by mouth daily., Disp: 90 tablet, Rfl: 0   spironolactone  (ALDACTONE ) 25 MG tablet, Take 25 mg by mouth every other day., Disp: , Rfl:    triamcinolone   ointment (KENALOG ) 0.1 %, APPLY A THIN LAYER TO THE AFFECTED AREA(S) ON LEFT ARM BY TOPICAL ROUTE 2 TIMES PER DAY FOR 7 DAYS, Disp: , Rfl:    Vitamin D , Ergocalciferol , (DRISDOL ) 1.25 MG (50000 UNIT) CAPS capsule, Take 1 capsule (50,000 Units total) by mouth every 7 (seven) days., Disp: 4 capsule, Rfl: 0    Physical Exam: There were no vitals taken for this visit.    Affect appropriate Healthy:  appears stated age HEENT: normal Neck supple with no  adenopathy JVP normal no bruits no thyromegaly Lungs clear with no wheezing and good diaphragmatic motion Heart:  S1/S2 no murmur, no rub, gallop or click PMI normal Abdomen: benighn, BS positve, no tenderness, no AAA no bruit.  No HSM or HJR prior appendectomy and hysterectomy  Distal pulses intact with no bruits No edema Prior left THR    Labs:   Lab Results  Component Value Date   WBC 5.3 11/12/2023   HGB 13.0 11/12/2023   HCT 40.3 11/12/2023   MCV 92.6 11/12/2023   PLT 195 11/12/2023   No results for input(s): NA, K, CL, CO2, BUN, CREATININE, CALCIUM , PROT, BILITOT, ALKPHOS, ALT, AST, GLUCOSE in the last 168 hours.  Invalid input(s): LABALBU Lab Results  Component Value Date   CKTOTAL 118 10/28/2012   CKMB 2.2 10/28/2012   CKMBINDEX 1.9 10/12/2016   TROPONINI 0.07 (HH) 09/02/2018    Lab Results  Component Value Date   CHOL 127 07/16/2022   CHOL 127 09/28/2021   CHOL 163 06/22/2021   Lab Results  Component Value Date   HDL 48 07/16/2022   HDL 48 09/28/2021   HDL 51 06/22/2021   Lab Results  Component Value Date   LDLCALC 55 07/16/2022   LDLCALC 60 09/28/2021   LDLCALC 87 06/22/2021   Lab Results  Component Value Date   TRIG 119 07/16/2022   TRIG 99 09/28/2021   TRIG 150 06/22/2021   Lab Results  Component Value Date   CHOLHDL 2.6 07/16/2022   CHOLHDL 4.2 04/23/2011   CHOLHDL 4.3 04/23/2011   No results found for: LDLDIRECT    Radiology: Home sleep test Result Date:  01/08/2024 Dohmeier, Dedra, MD     01/16/2024  4:10 PM   [facility] 63 year old female [birth year]  HOME SLEEP TEST REPORT ( by Watch PAT)  STUDY DATE:  9-16 -25, data load on 01-10-2024   ORDERING CLINICIAN: REFERRING CLINICIAN:  FNP Smith  CLINICAL INFORMATION/HISTORY:  12-13-2023: here for revisit 12/13/2023 for a new CPAP.  I had the pleasure of seeing [name] again who I had evaluated for sleep apnea back  in November 2015.  She has been diagnosed with obstructive sleep apnea and started CPAP, but her machine is now 64 years old.  It has started to sputter and not reliably provide positive airway pressure to her.  She was dx by a PSG , with mild apnea  of AHI 7.9 /h  and started on CPAP.  She used 16 cm water, 3 cm EPR and had reached an AHI 0.3/h. She was recently on Ozempic  and Mounjaro , couldn't tolerate either, she started itching.  She struggles with DM and obesity and she feels better when her CPAP works well.  She is back at work for [employer] benefits, 12 hours a week.  She sleeps much better on CPAP/   Epworth sleepiness score: 6/ 24, FSS at 22/63 BMI: 43.2  kg/m Neck Circumference: 17   Sleep Summary:  Total Recording Time (hours, min):    8 hours and 26 minutes   Total Sleep Time (hours, min):     7 h and 18 minutes           Percent REM (%):    17.4 %                                  Respiratory Indices:  Calculated pAHI (  per AASM guideline): 17.9/h                       REM pAHI:   36 /h                                            NREM pAHI:   14.1/h                           Positional AHI:  supine AHI was 18.7 /h and non-supine AHI was 16.3/h  Snoring:  46 dB mean volume - loud.                                             Oxygen Saturation Statistics:  Oxygen Saturation (%) Mean:  92%, between 78% and 97% and with 7.2 minutes of hypoxia-  O2 Saturation (minutes) <89%:  all hypoxia was related to supine sleep.        Pulse Rate Statistics:  Pulse Mean (bpm):   83 bpm  and varied from  59 through 115 bpm                      IMPRESSION:  This HST confirms the presence of  moderate severe Obstructive sleep apnea associated with hypoxia and with loud snoring.  RECOMMENDATION: continuation of CPAP therapy is recommended.  New autotitration CPAP device by [employer] will be provided with a setting from 10-16 cm water, 3 cm EPR and heated humidifier, and mask of choice. The FDA approved weight loss medication for patients with obesity related sleep apnea ( OSA) with moderate to severe degree of apnea. The medication can be prescribed by PCP or weight loss clinics but may still not be covered by insurance. Any patient should be cautioned not to drive, work at heights, or operate dangerous or heavy equipment when tired or sleepy. Review of good sleep hygiene measures is accessible to any sleep clinic patient and can be reiterated through online material- I we recommend the Guide to better Sleep   by the NIH. Weight loss and Core Strength improvement is highly recommended for individuals with low muscle tone and/ or a BMI over 30. Any CPAP patient should be reminded to be fully compliant with PAP therapy , (defined as using PAP therapy for more than 4 hours each night ) with the goal to improve sleep related symptoms and long term cardiovascular risks.  Please note that untreated obstructive sleep apnea may carry additional perioperative morbidity. Patients with significant obstructive sleep apnea should receive perioperative PAP therapy and the surgical team should be informed of the diagnosis and degree of sleep disordered breathing. The referring physician will be notified of the test results.  INTERPRETING PHYSICIAN:  Dedra Gores, MD [facility] Sleep Board certified by The [employer] of Sleep Medicine and Diplomate of the [employer] of Sleep Medicine. Board certified In Neurology through the ABPN, Fellow of the [employer] of  Neurology.     EKG: SR rate 68 IVCD 11/13/23    ASSESSMENT AND PLAN:   Chest Pain:  non cardiac multiple caths most recent 12/2022 with only 25% mid LAD stenosis EF normal by TTE 12/21/23.  Never acute ECG changes and always negative troponin. CTA 11/12/23 no dissection and negative for PE  Observe HLD: continue lipitor labs with primary HTN  continue aldactone , zestril  and coreg  DM:  Improved control A1c 6.5 10/09/23  Breast Cancer :  prior right lumpectomy some residual lymphedema f/u with oncology  OSA:  wears CPAP f/u pulmonary ? Some component of asthma started on Breo. PFTls 12/03/23 moderate restriction mildly decreased diffusion ? From obesity Recent cath with normal EDP and no severe pulmonary HTN and TTE with normal EF no significant valve dx  F/U Cardiology PRN    Signed: Maude Emmer 01/25/2024, 3:57 PM

## 2024-02-05 ENCOUNTER — Ambulatory Visit: Attending: Cardiovascular Disease | Admitting: Cardiovascular Disease

## 2024-02-07 ENCOUNTER — Encounter: Payer: Self-pay | Admitting: Cardiovascular Disease

## 2024-02-14 ENCOUNTER — Ambulatory Visit (INDEPENDENT_AMBULATORY_CARE_PROVIDER_SITE_OTHER): Admitting: Adult Health

## 2024-02-19 ENCOUNTER — Ambulatory Visit (INDEPENDENT_AMBULATORY_CARE_PROVIDER_SITE_OTHER): Admitting: Adult Health

## 2024-02-19 ENCOUNTER — Encounter (INDEPENDENT_AMBULATORY_CARE_PROVIDER_SITE_OTHER): Payer: Self-pay | Admitting: Adult Health

## 2024-02-19 VITALS — BP 124/80 | HR 76 | Temp 98.6°F | Ht 59.0 in | Wt 198.0 lb

## 2024-02-19 DIAGNOSIS — E559 Vitamin D deficiency, unspecified: Secondary | ICD-10-CM

## 2024-02-19 DIAGNOSIS — E1165 Type 2 diabetes mellitus with hyperglycemia: Secondary | ICD-10-CM | POA: Diagnosis not present

## 2024-02-19 DIAGNOSIS — G4733 Obstructive sleep apnea (adult) (pediatric): Secondary | ICD-10-CM | POA: Diagnosis not present

## 2024-02-19 DIAGNOSIS — G478 Other sleep disorders: Secondary | ICD-10-CM

## 2024-02-19 DIAGNOSIS — Z7985 Long-term (current) use of injectable non-insulin antidiabetic drugs: Secondary | ICD-10-CM

## 2024-02-19 DIAGNOSIS — E669 Obesity, unspecified: Secondary | ICD-10-CM

## 2024-02-19 DIAGNOSIS — Z7984 Long term (current) use of oral hypoglycemic drugs: Secondary | ICD-10-CM

## 2024-02-19 DIAGNOSIS — Z6841 Body Mass Index (BMI) 40.0 and over, adult: Secondary | ICD-10-CM

## 2024-02-19 MED ORDER — METFORMIN HCL ER 500 MG PO TB24: 500.0000 mg | ORAL_TABLET | Freq: Every day | ORAL | 0 refills | Status: DC

## 2024-02-19 MED ORDER — VITAMIN D (ERGOCALCIFEROL) 1.25 MG (50000 UNIT) PO CAPS: 50000.0000 [IU] | ORAL_CAPSULE | ORAL | 0 refills | Status: DC

## 2024-02-19 NOTE — Progress Notes (Signed)
 Cardiology Office Note:    Date:  02/26/2024   ID:  CAYA SOBERANIS, DOB [birth year], MRN [id number]  PCP:  Arloa Jarvis, NP   [facility] Providers Cardiologist:  Maude Emmer, MD Cardiology APP:  [name] Jon Garre, PA     Referring MD: [facility]   Chief complaint: Follow-up HFpEF and CAD     History of Present Illness:   [name] is a 63 y.o. female with a hx of HFpEF, OSA, remote history of PE no longer on oral anticoagulation, TIA, hypertension, hyperlipidemia, breast cancer, atypical chest pain.  Presents today for follow-up after recent echocardiogram.  Abnormal POET 12/2022 that l/t R/LHC on 01/11/2023: Minimal obstructive coronary artery disease, midLAD 25% stenosis. The right coronary artery is small and nondominant. Wedge pressure mean 12 mmHg, pulmonary pressure 45/14 with a mean pressure of 28 mmHg, PVR of 2.5 Woods units, LVEDP 14 mmHg. Medical therapy recommended.   11/12/2023 patient presented to the ED with chest pain beginning while she was playing bingo.  Aspirin  and nitroglycerin  did not improve chest pain.  Chest wall tenderness with central chest pressure reproducible on exam.  HS troponin negative X2, CXR showed no acute findings, CBC and BMP unremarkable, EKG sinus rhythm 67 bpm, nonspecific intraventricular conduction delay, old anteroseptal infarct, borderline repolarization abnormality.  Fentanyl  in ED improved symptoms.  Referral placed for urgent cardiology follow-up.  Seen the following day by Jon [name] PA, presented in a wheelchair due to ongoing dizziness.  Was noted that 2 days prior to her ED visit she did extra to lymphedema treatment (from prior breast cancer).  Echo was ordered.  Echo 12/21/2023: LVEF 55-60%, normal LV function, no RWMA, normal diastolic parameters.  Normal RV.  Trivial mitral regurgitation.  Normal aortic valve.  RA pressure 3 mmHg.  Presents independently to clinic, appears stable from a  cardiovascular standpoint.  Denies chest pain, shortness of breath.  Reports central back pain in between shoulder blades that comes and goes occasionally with inspiration, happens a couple times a week, not associated with palpitations, nausea, near-syncope, not affected by activity or rest, has not noticed any correlation to eating.  Reports occasional palpitations, maybe occurring once a month.  Reports increased stress with her back pain/injury and trying to perform the functions of her job, fatigue, and a lack of appetite.  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  Mobility primarily restricted secondary to back pain at this time. She is following up with [facility] for management, next appointment is in 2 days, this is preventing her from attending her normal water aerobics class.  Denies any changes to urine or stool habits, denies any significant weight changes.  Reports mild bilateral lower extremity edema, left always slightly worse than right, patient reports this is her baseline.  ROS:   Please see the history of present illness.    All other systems reviewed and are negative.     Past Medical History:  Diagnosis Date   Allergy    Anginal pain    a. NL cath in 2008;  b. Myoview  03/2011: dec uptake along mid anterior wall on stress imaging -> ? attenuation vs. ischemia, EF 65%;  c. 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 2008 -> coumadin x 6 mos.   Rheumatoid arthritis (HCC)    Sleep apnea    on CPAP 02/2018   SOB (shortness of breath)    Thyroid  disease    TIA (transient ischemic attack)    Urinary urgency    Vitamin D  deficiency     Past Surgical History:  Procedure Laterality Date   ABDOMINAL HYSTERECTOMY  2005   APPENDECTOMY     BREAST LUMPECTOMY WITH RADIOACTIVE SEED AND SENTINEL LYMPH NODE BIOPSY Right 11/16/2021   Procedure: RIGHT BREAST RADIOACTIVE SEED LOCALIZED LUMPECTOMY WITH RIGHT AXILLARY SENTINEL LYMPH NODE BIOPSY;  Surgeon: Belinda Cough, MD;  Location: [facility];  Service: General;  Laterality: Right;  LMA & PEC BLOCK   CARDIAC CATHETERIZATION     Normal   CARDIAC CATHETERIZATION N/A 09/30/2014   Procedure: Left Heart Cath and Coronary Angiography;  Surgeon: Ozell Fell, MD;  Location: [facility] INVASIVE CV LAB;  Service: Cardiovascular;  Laterality: N/A;   LAPAROSCOPIC APPENDECTOMY N/A 06/03/2012   Procedure: APPENDECTOMY LAPAROSCOPIC;  Surgeon: Jina Nephew, MD;  Location: [facility] OR;  Service: General;  Laterality: N/A;   Left knee surgery  2008   LEG SURGERY     RIGHT/LEFT HEART CATH AND CORONARY ANGIOGRAPHY N/A 01/10/2023   Procedure: RIGHT/LEFT HEART CATH AND CORONARY ANGIOGRAPHY;  Surgeon: Wendel Lurena POUR, MD;  Location: [facility] INVASIVE CV LAB;  Service: Cardiovascular;  Laterality: N/A;   TONSILLECTOMY     TOTAL HIP ARTHROPLASTY Left 06/11/2018   Procedure: LEFT TOTAL HIP ARTHROPLASTY ANTERIOR APPROACH;  Surgeon: Addie Cordella Hamilton, MD;  Location: [facility] OR;  Service: Orthopedics;  Laterality: Left;   TOTAL KNEE ARTHROPLASTY Left 08/22/2016   Procedure: TOTAL KNEE ARTHROPLASTY;  Surgeon: Addie Hamilton Cordella, MD;  Location: [facility] OR;  Service:  Orthopedics;  Laterality: Left;   TUBAL LIGATION  1989    Current Medications: Current Meds  Medication Sig   ACCU-CHEK GUIDE test strip    Accu-Chek Softclix Lancets lancets SMARTSIG:Topical   albuterol  (PROVENTIL  HFA;VENTOLIN  HFA) 108 (90 Base) MCG/ACT inhaler Inhale 2 puffs into the lungs every 6 (six) hours as needed for wheezing or shortness of breath.   albuterol  (VENTOLIN  HFA) 108 (90 Base) MCG/ACT inhaler Inhale 1-2 puffs into the lungs every 6 (six) hours as needed for wheezing or shortness of breath.   atorvastatin  (LIPITOR) 40 MG tablet Take 40 mg by mouth at bedtime.    busPIRone  (BUSPAR ) 30 MG tablet Take 30 mg by mouth 2 (two) times daily.   carvedilol  (COREG ) 3.125 MG tablet Take 1 tablet (3.125 mg total) by mouth  2 (two) times daily.   diclofenac  Sodium (VOLTAREN ) 1 % GEL Apply 2 g topically 4 (four) times daily as needed (joint/muscle pain).   empagliflozin  (JARDIANCE ) 10 MG TABS tablet Take 1 tablet (10 mg total) by mouth daily.   EPINEPHrine  0.3 mg/0.3 mL IJ SOAJ injection Inject 0.3 mg into the muscle as needed for anaphylaxis. Follow package instructions as needed for severe allergy or anaphylactic reaction.   furosemide  (LASIX ) 40 MG tablet Take 1 tablet (40 mg total) by mouth daily as needed for edema or fluid (take in case of weight gain 2 to 3 lbs in 24 hrs or 5 lbs in 7 days.).   hydrocortisone 2.5 % cream SMARTSIG:sparingly Topical Twice Daily PRN   hydrOXYzine  (VISTARIL ) 25 MG capsule Take 25 mg by mouth 3 (three) times daily as needed for itching.   ipratropium-albuterol  (DUONEB) 0.5-2.5 (3) MG/3ML SOLN Take 3 mLs by nebulization every 6 (six) hours as needed (shortness of breath).   KLOR-CON  M10 10 MEQ tablet Take 10 mEq by mouth 2 (two) times daily.   lidocaine  (LIDODERM ) 5 % APPLY 1 PATCH BY TOPICAL ROUTE ONCE DAILY (MAY WEAR UP TO 12HOURS.)   loperamide  (IMODIUM ) 2 MG capsule Take 1 capsule (2 mg total) by mouth 4 (four) times daily as needed for diarrhea or  loose stools.   metFORMIN  (GLUCOPHAGE -XR) 500 MG 24 hr tablet Take 1 tablet (500 mg total) by mouth daily with breakfast.   Multiple Vitamin (MULTIVITAMIN) tablet Take 1 tablet by mouth daily.   mupirocin ointment (BACTROBAN) 2 % Apply 1 Application topically 2 (two) times daily.   nitroGLYCERIN  (NITROSTAT ) 0.3 MG SL tablet Place 0.3 mg under the tongue every 5 (five) minutes as needed for chest pain.   nitroGLYCERIN  (NITROSTAT ) 0.4 MG SL tablet PLACE 1 TABLET UNDER THE TONGUE EVERY 5 MINUTES AS NEEDED FOR CHEST PAIN.   nystatin (MYCOSTATIN/NYSTOP) powder Apply 1 application topically 3 (three) times daily as needed for rash.   pantoprazole  (PROTONIX ) 40 MG tablet Take 40 mg by mouth daily.   RYBELSUS 7 MG TABS TAKE 1 TABLET BY MOUTH IN THE MORNING DAILY FOR DIABETES. (Patient taking differently: Take 14 mg by mouth daily.)   sertraline  (ZOLOFT ) 25 MG tablet Take 1 tablet (25 mg total) by mouth daily.   spironolactone  (ALDACTONE ) 25 MG tablet Take 25 mg by mouth every other day.   Vitamin D , Ergocalciferol , (DRISDOL ) 1.25 MG (50000 UNIT) CAPS capsule Take 1 capsule (50,000 Units total) by mouth every 7 (seven) days.     Allergies:   Shellfish allergy, Hydrocodone -acetaminophen , Penicillins, Gabapentin , Latex, and Sulfa antibiotics   Social History   Socioeconomic History   Marital status: Divorced    Spouse name: Not on file   Number of children: 3   Years of education: Not on file   Highest education level: Not on file  Occupational History   Occupation: UNEMPLOYED    Employer: DISABLED  Tobacco Use   Smoking status: Former    Current packs/day: 0.00    Average packs/day: 0.5 packs/day for 15.0 years (7.5 ttl pk-yrs)    Types: Cigarettes    Start date: 04/24/1970    Quit date: 04/24/1985    Years since quitting: 38.8   Smokeless tobacco: Never  Vaping Use   Vaping status: Never Used  Substance and Sexual Activity   Alcohol use: No    Alcohol/week: 0.0 standard drinks of alcohol    Drug use: Not Currently    Types: Marijuana   Sexual  activity: Not Currently    Birth control/protection: Surgical    Comment: hyst  Other Topics Concern   Not on file  Social History Narrative   Lives in [place] by herself.  Disabled.  Occasional Tea .  3 kids, 6 grandkids.     Water aerobics 3 times a week.   Social Drivers of Corporate Investment Banker Strain: High Risk (03/27/2022)   Overall Financial Resource Strain (CARDIA)    Difficulty of Paying Living Expenses: Very hard  Food Insecurity: Food Insecurity Present (03/27/2022)   Hunger Vital Sign    Worried About Running Out of Food in the Last Year: Sometimes true    Ran Out of Food in the Last Year: Sometimes true  Transportation Needs: No Transportation Needs (03/27/2022)   PRAPARE - Administrator, Civil Service (Medical): No    Lack of Transportation (Non-Medical): No  Physical Activity: Not on file  Stress: Not on file  Social Connections: Not on file     Family History: The patient's family history includes Alcoholism in her father and mother; Anxiety disorder in her mother; Arthritis in her mother; Asthma in her brother and daughter; Bipolar disorder in her father; Breast cancer in her cousin; Breast cancer (age of onset: 39 - 42) in her maternal aunt; Colon cancer in her maternal grandfather; Depression in her father and mother; Diabetes in her father, mother, and sister; Drug abuse in her father; Emphysema in her mother; Heart disease in her father, mother, and paternal grandfather; Heart failure in her mother; Hyperlipidemia in her father and mother; Hypertension in her father and mother; Sleep apnea in her father; Stroke in her father and mother; Sudden death in her father. There is no history of Esophageal cancer, Stomach cancer, or Rectal cancer.  EKGs/Labs/Other Studies Reviewed:    The following studies were reviewed today:  EKG Interpretation Date/Time:  Tuesday February 26 2024 14:19:02  EST Ventricular Rate:  71 PR Interval:  192 QRS Duration:  114 QT Interval:  394 QTC Calculation: 428 R Axis:   9  Text Interpretation: Normal sinus rhythm Septal infarct (cited on or before 13-Nov-2023) When compared with ECG of 13-Nov-2023 16:00, No significant change was found Confirmed by Mehtaab Mayeda [phone]) on 02/26/2024 4:31:00 PM    Recent Labs: 07/25/2023: B Natriuretic Peptide 16.6 11/12/2023: Hemoglobin 13.0; Platelets 195 01/22/2024: TSH 0.41 02/19/2024: ALT 33; BUN 17; Creatinine, Ser 0.99; Magnesium  2.1; Potassium 4.3; Sodium 142  Recent Lipid Panel    Component Value Date/Time   CHOL 127 07/16/2022 0145   CHOL 127 09/28/2021 0951   TRIG 119 07/16/2022 0145   HDL 48 07/16/2022 0145   HDL 48 09/28/2021 0951   CHOLHDL 2.6 07/16/2022 0145   VLDL 24 07/16/2022 0145   LDLCALC 55 07/16/2022 0145   LDLCALC 60 09/28/2021 0951     Physical Exam:    VS:  BP 124/62   Pulse 71   Ht 4' 10 (1.473 m)   Wt 205 lb 9.6 oz (93.3 kg)   SpO2 97%   BMI 42.97 kg/m        Wt Readings from Last 3 Encounters:  02/26/24 205 lb 9.6 oz (93.3 kg)  02/19/24 198 lb (89.8 kg)  01/24/24 206 lb 9.6 oz (93.7 kg)     GEN:  Well nourished, well developed in no acute distress HEENT: Normal NECK:  No carotid bruits CARDIAC:  S1-S2 normal, RRR, no murmurs, rubs, gallops RESPIRATORY:  Clear to auscultation without rales,  wheezing or rhonchi  MUSCULOSKELETAL:  No edema; No deformity, palpable bilateral DPs SKIN: Warm and dry NEUROLOGIC:  Alert and oriented x 3 PSYCHIATRIC:  Normal affect       Assessment & Plan Chronic heart failure with preserved ejection fraction (HFpEF) (HCC) Echo 12/21/2023: LVEF 55-60%, normal LV function, no RWMA, normal diastolic parameters. Denies chest pain, shortness of breath,  near-syncope, frequent palpitations Reports fatigue, patient believes it may be related to stress secondary to job and back pain Volume status appears stable, mild bilateral lower  extremity edema, 1+, present on exam, lung sounds clear Weight is stable, averages around 200 lb.  Does take as needed Lasix  once per week for lower extremity edema, which helps maintain weight stability, last dose was last week. Patient states she stopped her lisinopril  one month ago because it was giving her headaches. She restarted her losartan  25 mg daily. Has been taking this daily over the last month, blood pressures reported well-controlled, labs 5 days ago show stable kidney function and potassium, okay to continue losartan  daily. Continue carvedilol  3.125 mg twice daily Continue Jardiance  10 mg daily Continue losartan  25 mg daily Continue spironolactone  25 mg every other day Labs 02/19/2024: BUN 17, creatinine 0.99, potassium 4.3, sodium 142 Coronary artery disease involving native coronary artery of native heart, unspecified whether angina present LHC on 01/11/2023: Minimal obstructive coronary artery disease, midLAD 25% stenosis. The right coronary artery is small and nondominant.  Medical therapy recommended. EKG: Normal sinus rhythm, old septal infarct, 71 bpm, no significant change from previous studies Echo 12/21/2023: LVEF 55-60%, normal LV function, no RWMA, normal diastolic parameters. US  venous LLE r/o 01/18/2024 at [facility] shows no DVT LLE Reports fatigue she relates to stress from her job/back pain Denies chest pain, shortness of breath, near-syncope, change in leg swelling Has not used/needed her nitroglycerin  Continue aspirin  EC 81 mg daily Continue carvedilol  3.125 mg twice daily Continue atorvastatin  40 mg daily Continue nitroglycerin  0.4 mg SL tab PRN chest pain every 5 minutes x 3 Primary hypertension BPs reported well-controlled at home  Continue carvedilol  3.125 mg twice daily Continue Jardiance  10 mg daily Continue losartan  25 mg daily Continue spironolactone  25 mg every other day Hyperlipidemia, unspecified hyperlipidemia type LDL goal <70 Lipid panel 07/16/2022:  Cholesterol 127, HDL 48, LDL 55, triglycerides 119 LFTs 02/19/2024: AST 21, ALT 33 Will repeat fasting lipid panel Continue atorvastatin  40 mg daily  Follow-up in 4 months with MD or APP          Medication Adjustments/Labs and Tests Ordered: Current medicines are reviewed at length with the patient today.  Concerns regarding medicines are outlined above.  Orders Placed This Encounter  Procedures   Lipid Profile   EKG 12-Lead   Meds ordered this encounter  Medications   losartan  (COZAAR ) 25 MG tablet    Sig: Take 1 tablet (25 mg total) by mouth daily.    Dispense:  90 tablet    Refill:  1    Supervising Provider:   PATWARDHAN, MANISH J [[id number]]    Patient Instructions  Medication Instructions:  Your physician recommends that you continue on your current medications as directed. Please refer to the Current Medication list given to you today.  *If you need a refill on your cardiac medications before your next appointment, please call your pharmacy*  Lab Work: Fasting Lipid If you have labs (blood work) drawn today and your tests are completely normal, you will receive your results only by: MyChart Message (if you have  MyChart) OR A paper copy in the mail If you have any lab test that is abnormal or we need to change your treatment, we will call you to review the results.  Testing/Procedures: NONE  Follow-Up: At [facility], you and your health needs are our priority.  As part of our continuing mission to provide you with exceptional heart care, our providers are all part of one team.  This team includes your primary Cardiologist (physician) and Advanced Practice Providers or APPs (Physician Assistants and Nurse Practitioners) who all work together to provide you with the care you need, when you need it.  Your next appointment:   4 month(s)  Provider:   Maude Emmer, MD   We recommend signing up for the patient portal called MyChart.  Sign up information  is provided on this After Visit Summary.  MyChart is used to connect with patients for Virtual Visits (Telemedicine).  Patients are able to view lab/test results, encounter notes, upcoming appointments, etc.  Non-urgent messages can be sent to your provider as well.   To learn more about what you can do with MyChart, go to [email or web address].   Other Instructions IF your having chest pain, shortness of Breath, feelings of passing out and other concerning symptoms report to the ER.            Signed, Miriam FORBES Shams, NP  02/26/2024 5:08 PM    [facility]

## 2024-02-19 NOTE — Progress Notes (Signed)
 WEIGHT SUMMARY AND BIOMETRICS  Vitals Temp: 98.6 F (37 C) BP: 124/80 Pulse Rate: 76 SpO2: 98 %   Anthropometric Measurements Height: 4' 11 (1.499 m) Weight: 198 lb (89.8 kg) BMI (Calculated): 39.97 Weight at Last Visit: 198lb Weight Lost Since Last Visit: 0lb Weight Gained Since Last Visit: 0lb Starting Weight: 209lb Total Weight Loss (lbs): 11 lb (4.99 kg)   Body Composition  Body Fat %: 47.2 % Fat Mass (lbs): 93.4 lbs Muscle Mass (lbs): 99.2 lbs Total Body Water (lbs): 73.2 lbs Visceral Fat Rating : 15   Other Clinical Data Fasting: Yes Labs: Yes Today's Visit #: 30 Starting Date: 06/01/21    Chief Complaint:   OBESITY [name] is here to discuss her progress with her obesity treatment plan.  She is on the the Category 2 Plan and states she is following her eating plan approximately 70 % of the time.  She states she is exercising:None, r/t to recent increased back pain  Interim History:   Ms. Herter reports the following sources of stress/anxiety r/t immediate family members: -Her youngest daughter was recently dx'd with Graves Disease and is experiencing financial hardships and she may need to vacate her home soon. -She feels that her only son is shouldering the majority of household and child care responsibilities. He works nights FT and will often solely care for his two children while his wife (their mother) works TE[employer]ER[employer], days in [place]. -She reports worsening back pain and is unable to exercise as a result.  She estimates to drink between 30-60 oz water/day  Subjective:   1. Type 2 diabetes mellitus with hyperglycemia, without long-term current use of insulin  (HCC) Lab Results  Component Value Date   HGBA1C 6.5 (H) 10/09/2023   HGBA1C 7.1 (H) 05/07/2023   HGBA1C 7.9 (H) 12/11/2022    PCP manages daily Rybelsus 7mg  HFpEF 10 mg Jardiance  HWW manages daily Metfomin XR 500mg   Fasting CBG will range from 110s- low 150s She denies sx's of  hypoglycemia  2. Vitamin D  deficiency She is in weekly Ergocalciferol - denies N/V/Muscle Weakness  3. Poor sleep pattern- decreased sleep duration She averages 7-9 hours night of CPAP usage She reports beng able to easily fall asleep, however will awaken often for nocturia. She will occasionally nap during daytime.  4. OSA on CPAP She averages 7-9 hours night of CPAP usage She reports beng able to easily fall asleep, however will awaken often for noctuira.  01/24/2024 Pulmonology OV Notes: History of Present Illness [name] is a 63 year old female with sleep apnea and asthma who presents for follow-up for dyspnea.  Last visit was felt to have a possible component of asthma.  She was started on Breo.  Patient does feel that the Ran[place] has helped with her breathing she has no significant cough or wheezing.  She was set up for pulmonary function testing that was completed December 03, 2023 that showed moderate restriction with no significant airflow obstruction.  Diffusing capacity was slightly decreased.  FEV1 was 60%, ratio 79, FVC 58%, DLCO 70%.  Suspect some restriction is related to body habitus with current weight at 206 pounds with a BMI of 44.  We reviewed her pulmonary function testing in detail   She has been using a CPAP machine at night for her sleep apnea, but the machine sometimes malfunctions. She is in the process of obtaining a new one. Currently, she uses the CPAP for about four to five hours per night. A  recent home sleep study was conducted, and she is following up with her neurologist regarding the CPAP machine replacement.  Assessment/Plan:   1. Type 2 diabetes mellitus with hyperglycemia, without long-term current use of insulin  (HCC) (Primary) Refill - metFORMIN  (GLUCOPHAGE -XR) 500 MG 24 hr tablet; Take 1 tablet (500 mg total) by mouth daily with breakfast.  Dispense: 30 tablet; Refill: 0 Check Labs- - Comprehensive metabolic panel with GFR - Hemoglobin A1c -  Insulin , random - Magnesium  - Vitamin B12  2. Vitamin D  deficiency Refill - Vitamin D , Ergocalciferol , (DRISDOL ) 1.25 MG (50000 UNIT) CAPS capsule; Take 1 capsule (50,000 Units total) by mouth every 7 (seven) days.  Dispense: 4 capsule; Refill: 0 Check Labs- - VITAMIN D  25 Hydroxy (Vit-D Deficiency, Fractures)  3. Poor sleep pattern- decreased sleep duration Practice Good Sleep Hygiene MyChart message sent in regards to Sleep Habits  4. OSA on CPAP Continue with weight loss efforts MyChart message sent in regards to Sleep Habits  01/24/2024 Pulmonology OV Notes: Assessment & Plan Mild persistent asthma - Mild persistent asthma with some pulmonary restriction noted on a recent breathing test.  She is currently on Breo and an albuterol  inhaler, which effectively manage symptoms. No significant coughing or shortness of breath reported. Continue Breo and albuterol  inhaler, ensure proper rinsing after using Breo, and encourage staying active within physical limitations. Administer flu shot.   Restrictive lung changes-suspect related to body habitus possibly some underlying asthma.  Encouraged on healthy weight loss activity as tolerated   Obstructive sleep apnea   Obstructive sleep apnea is managed with CPAP therapy. She reports issues with the current CPAP machine cutting off, leading to inconsistent use of 4-5 hours per night. A new CPAP machine has been ordered by Dr. Elza, the neurologist. Continue CPAP therapy and follow up with neurology for CPAP machine replacement.   Morbid obesity encouraged healthy weight loss  5. BMI 40.0-44.9, adult (HCC) Current BMI 40.0  Jaidy is currently in the action stage of change. As such, her goal is to continue with weight loss efforts. She has agreed to the Category 2 Plan.   Exercise goals: No exercise has been prescribed at this time.  Behavioral modification strategies: increasing lean protein intake, decreasing simple carbohydrates,  increasing vegetables, increasing water intake, no skipping meals, meal planning and cooking strategies, keeping healthy foods in the home, ways to avoid boredom eating, and planning for success.  Ricka has agreed to follow-up with our clinic in 4 weeks. She was informed of the importance of frequent follow-up visits to maximize her success with intensive lifestyle modifications for her multiple health conditions.   Liora was informed we would discuss her lab results at her next visit unless there is a critical issue that needs to be addressed sooner. Annamae agreed to keep her next visit at the agreed upon time to discuss these results.  Objective:   Blood pressure 124/80, pulse 76, temperature 98.6 F (37 C), height 4' 11 (1.499 m), weight 198 lb (89.8 kg), SpO2 98%. Body mass index is 39.99 kg/m.  General: Cooperative, alert, well developed, in no acute distress. HEENT: Conjunctivae and lids unremarkable. Cardiovascular: Regular rhythm.  Lungs: Normal work of breathing. Neurologic: No focal deficits.   Lab Results  Component Value Date   CREATININE 1.04 (H) 11/12/2023   BUN 18 11/12/2023   NA 139 11/12/2023   K 3.8 11/12/2023   CL 107 11/12/2023   CO2 24 11/12/2023   Lab Results  Component Value Date  ALT 23 10/09/2023   AST 16 10/09/2023   ALKPHOS 64 10/09/2023   BILITOT 0.3 10/09/2023   Lab Results  Component Value Date   HGBA1C 6.5 (H) 10/09/2023   HGBA1C 7.1 (H) 05/07/2023   HGBA1C 7.9 (H) 12/11/2022   HGBA1C 7.3 (H) 07/15/2022   HGBA1C 6.4 (H) 09/28/2021   Lab Results  Component Value Date   INSULIN  19.1 10/09/2023   INSULIN  20.2 06/05/2023   INSULIN  13.4 08/14/2022   INSULIN  22.1 09/28/2021   INSULIN  26.8 (H) 06/01/2021   Lab Results  Component Value Date   TSH 0.41 01/22/2024   Lab Results  Component Value Date   CHOL 127 07/16/2022   HDL 48 07/16/2022   LDLCALC 55 07/16/2022   TRIG 119 07/16/2022   CHOLHDL 2.6 07/16/2022   Lab Results   Component Value Date   VD25OH 46.6 10/09/2023   VD25OH 51.0 05/07/2023   VD25OH 33.5 01/11/2023   Lab Results  Component Value Date   WBC 5.3 11/12/2023   HGB 13.0 11/12/2023   HCT 40.3 11/12/2023   MCV 92.6 11/12/2023   PLT 195 11/12/2023   No results found for: IRON, TIBC, FERRITIN  Attestation Statements:   Reviewed by clinician on day of visit: allergies, medications, problem list, medical history, surgical history, family history, social history, and previous encounter notes.  I have reviewed the above documentation for accuracy and completeness, and I agree with the above. -  Andreina Outten d. Gar Glance, NP-C

## 2024-02-20 LAB — COMPREHENSIVE METABOLIC PANEL WITH GFR
ALT: 33 IU/L — ABNORMAL HIGH (ref 0–32)
AST: 21 IU/L (ref 0–40)
Albumin: 4.4 g/dL (ref 3.9–4.9)
Alkaline Phosphatase: 62 IU/L (ref 49–135)
BUN/Creatinine Ratio: 17 (ref 12–28)
BUN: 17 mg/dL (ref 8–27)
Bilirubin Total: 0.6 mg/dL (ref 0.0–1.2)
CO2: 24 mmol/L (ref 20–29)
Calcium: 9.9 mg/dL (ref 8.7–10.3)
Chloride: 104 mmol/L (ref 96–106)
Creatinine, Ser: 0.99 mg/dL (ref 0.57–1.00)
Globulin, Total: 2.5 g/dL (ref 1.5–4.5)
Glucose: 102 mg/dL — ABNORMAL HIGH (ref 70–99)
Potassium: 4.3 mmol/L (ref 3.5–5.2)
Sodium: 142 mmol/L (ref 134–144)
Total Protein: 6.9 g/dL (ref 6.0–8.5)
eGFR: 64 mL/min/1.73 (ref >59)

## 2024-02-20 LAB — VITAMIN D 25 HYDROXY (VIT D DEFICIENCY, FRACTURES): Vit D, 25-Hydroxy: 54.9 ng/mL (ref 30.0–100.0)

## 2024-02-20 LAB — VITAMIN B12: Vitamin B-12: 485 pg/mL (ref 232–1245)

## 2024-02-20 LAB — HEMOGLOBIN A1C
Est. average glucose Bld gHb Est-mCnc: 137 mg/dL
Hgb A1c MFr Bld: 6.4 % — ABNORMAL HIGH (ref 4.8–5.6)

## 2024-02-20 LAB — INSULIN, RANDOM: INSULIN: 29.5 u[IU]/mL — ABNORMAL HIGH (ref 2.6–24.9)

## 2024-02-20 LAB — MAGNESIUM: Magnesium: 2.1 mg/dL (ref 1.6–2.3)

## 2024-02-21 ENCOUNTER — Other Ambulatory Visit: Payer: Self-pay | Admitting: Nurse Practitioner

## 2024-02-21 DIAGNOSIS — M549 Dorsalgia, unspecified: Secondary | ICD-10-CM

## 2024-02-26 ENCOUNTER — Encounter: Payer: Self-pay | Admitting: Emergency Medicine

## 2024-02-26 ENCOUNTER — Ambulatory Visit: Attending: Cardiology | Admitting: Emergency Medicine

## 2024-02-26 VITALS — BP 124/62 | HR 71 | Ht <= 58 in | Wt 205.6 lb

## 2024-02-26 DIAGNOSIS — I2510 Atherosclerotic heart disease of native coronary artery without angina pectoris: Secondary | ICD-10-CM

## 2024-02-26 DIAGNOSIS — I10 Essential (primary) hypertension: Secondary | ICD-10-CM | POA: Diagnosis not present

## 2024-02-26 DIAGNOSIS — E785 Hyperlipidemia, unspecified: Secondary | ICD-10-CM | POA: Diagnosis not present

## 2024-02-26 DIAGNOSIS — I5032 Chronic diastolic (congestive) heart failure: Secondary | ICD-10-CM | POA: Diagnosis not present

## 2024-02-26 MED ORDER — LOSARTAN POTASSIUM 25 MG PO TABS: 25.0000 mg | ORAL_TABLET | Freq: Every day | ORAL | 1 refills | Status: AC

## 2024-02-26 NOTE — Assessment & Plan Note (Addendum)
 [facility] on 01/11/2023: Minimal obstructive coronary artery disease, midLAD 25% stenosis. The right coronary artery is small and nondominant.  Medical therapy recommended. EKG: Normal sinus rhythm, old septal infarct, 71 bpm, no significant change from previous studies Echo 12/21/2023: LVEF 55-60%, normal LV function, no RWMA, normal diastolic parameters. US  venous LLE r/o 01/18/2024 at [facility] shows no DVT LLE Reports fatigue she relates to stress from her job/back pain Denies chest pain, shortness of breath, near-syncope, change in leg swelling Has not used/needed her nitroglycerin  Continue aspirin  EC 81 mg daily Continue carvedilol  3.125 mg twice daily Continue atorvastatin  40 mg daily Continue nitroglycerin  0.4 mg SL tab PRN chest pain every 5 minutes x 3

## 2024-02-26 NOTE — Patient Instructions (Addendum)
 Medication Instructions:  Your physician recommends that you continue on your current medications as directed. Please refer to the Current Medication list given to you today.  *If you need a refill on your cardiac medications before your next appointment, please call your pharmacy*  Lab Work: Fasting Lipid If you have labs (blood work) drawn today and your tests are completely normal, you will receive your results only by: MyChart Message (if you have MyChart) OR A paper copy in the mail If you have any lab test that is abnormal or we need to change your treatment, we will call you to review the results.  Testing/Procedures: NONE  Follow-Up: At [facility], you and your health needs are our priority.  As part of our continuing mission to provide you with exceptional heart care, our providers are all part of one team.  This team includes your primary Cardiologist (physician) and Advanced Practice Providers or APPs (Physician Assistants and Nurse Practitioners) who all work together to provide you with the care you need, when you need it.  Your next appointment:   4 month(s)  Provider:   Maude Emmer, MD   We recommend signing up for the patient portal called MyChart.  Sign up information is provided on this After Visit Summary.  MyChart is used to connect with patients for Virtual Visits (Telemedicine).  Patients are able to view lab/test results, encounter notes, upcoming appointments, etc.  Non-urgent messages can be sent to your provider as well.   To learn more about what you can do with MyChart, go to [email or web address].   Other Instructions IF your having chest pain, shortness of Breath, feelings of passing out and other concerning symptoms report to the ER.

## 2024-02-26 NOTE — Assessment & Plan Note (Addendum)
 BPs reported well-controlled at home  Continue carvedilol  3.125 mg twice daily Continue Jardiance  10 mg daily Continue losartan  25 mg daily Continue spironolactone  25 mg every other day

## 2024-02-28 ENCOUNTER — Telehealth: Payer: Self-pay | Admitting: Cardiovascular Disease

## 2024-02-28 ENCOUNTER — Telehealth: Payer: Self-pay

## 2024-02-28 LAB — LIPID PANEL
Chol/HDL Ratio: 2.8 ratio (ref 0.0–4.4)
Cholesterol, Total: 140 mg/dL (ref 100–199)
HDL: 50 mg/dL (ref >39)
LDL Chol Calc (NIH): 70 mg/dL (ref 0–99)
Triglycerides: 111 mg/dL (ref 0–149)
VLDL Cholesterol Cal: 20 mg/dL (ref 5–40)

## 2024-02-28 NOTE — Telephone Encounter (Signed)
 Pt calling to request a referral to a nutritionist. Please advise.

## 2024-02-28 NOTE — Telephone Encounter (Signed)
 Spoke with patient and confirmed appointment on 11/7

## 2024-02-29 ENCOUNTER — Inpatient Hospital Stay: Attending: Hematology and Oncology | Admitting: Hematology and Oncology

## 2024-02-29 VITALS — BP 130/78 | HR 78 | Temp 98.3°F | Resp 18 | Ht <= 58 in | Wt 200.2 lb

## 2024-02-29 DIAGNOSIS — Z880 Allergy status to penicillin: Secondary | ICD-10-CM | POA: Insufficient documentation

## 2024-02-29 DIAGNOSIS — Z885 Allergy status to narcotic agent status: Secondary | ICD-10-CM | POA: Insufficient documentation

## 2024-02-29 DIAGNOSIS — Z1732 Human epidermal growth factor receptor 2 negative status: Secondary | ICD-10-CM | POA: Insufficient documentation

## 2024-02-29 DIAGNOSIS — E119 Type 2 diabetes mellitus without complications: Secondary | ICD-10-CM | POA: Insufficient documentation

## 2024-02-29 DIAGNOSIS — Z8673 Personal history of transient ischemic attack (TIA), and cerebral infarction without residual deficits: Secondary | ICD-10-CM | POA: Insufficient documentation

## 2024-02-29 DIAGNOSIS — Z882 Allergy status to sulfonamides status: Secondary | ICD-10-CM | POA: Diagnosis not present

## 2024-02-29 DIAGNOSIS — L304 Erythema intertrigo: Secondary | ICD-10-CM | POA: Diagnosis not present

## 2024-02-29 DIAGNOSIS — C50211 Malignant neoplasm of upper-inner quadrant of right female breast: Secondary | ICD-10-CM | POA: Insufficient documentation

## 2024-02-29 DIAGNOSIS — Z79899 Other long term (current) drug therapy: Secondary | ICD-10-CM | POA: Insufficient documentation

## 2024-02-29 DIAGNOSIS — I2510 Atherosclerotic heart disease of native coronary artery without angina pectoris: Secondary | ICD-10-CM | POA: Diagnosis not present

## 2024-02-29 DIAGNOSIS — Z811 Family history of alcohol abuse and dependence: Secondary | ICD-10-CM | POA: Insufficient documentation

## 2024-02-29 DIAGNOSIS — Z823 Family history of stroke: Secondary | ICD-10-CM | POA: Insufficient documentation

## 2024-02-29 DIAGNOSIS — Z8701 Personal history of pneumonia (recurrent): Secondary | ICD-10-CM | POA: Diagnosis not present

## 2024-02-29 DIAGNOSIS — Z8711 Personal history of peptic ulcer disease: Secondary | ICD-10-CM | POA: Diagnosis not present

## 2024-02-29 DIAGNOSIS — Z1722 Progesterone receptor negative status: Secondary | ICD-10-CM | POA: Diagnosis not present

## 2024-02-29 DIAGNOSIS — M549 Dorsalgia, unspecified: Secondary | ICD-10-CM | POA: Insufficient documentation

## 2024-02-29 DIAGNOSIS — Z7982 Long term (current) use of aspirin: Secondary | ICD-10-CM | POA: Diagnosis not present

## 2024-02-29 DIAGNOSIS — Z8249 Family history of ischemic heart disease and other diseases of the circulatory system: Secondary | ICD-10-CM | POA: Insufficient documentation

## 2024-02-29 DIAGNOSIS — C50411 Malignant neoplasm of upper-outer quadrant of right female breast: Secondary | ICD-10-CM

## 2024-02-29 DIAGNOSIS — Z833 Family history of diabetes mellitus: Secondary | ICD-10-CM | POA: Insufficient documentation

## 2024-02-29 DIAGNOSIS — Z86711 Personal history of pulmonary embolism: Secondary | ICD-10-CM | POA: Diagnosis not present

## 2024-02-29 DIAGNOSIS — Z888 Allergy status to other drugs, medicaments and biological substances status: Secondary | ICD-10-CM | POA: Insufficient documentation

## 2024-02-29 DIAGNOSIS — I110 Hypertensive heart disease with heart failure: Secondary | ICD-10-CM | POA: Insufficient documentation

## 2024-02-29 DIAGNOSIS — Z803 Family history of malignant neoplasm of breast: Secondary | ICD-10-CM | POA: Insufficient documentation

## 2024-02-29 DIAGNOSIS — Z59868 Other specified financial insecurity: Secondary | ICD-10-CM | POA: Insufficient documentation

## 2024-02-29 DIAGNOSIS — Z171 Estrogen receptor negative status [ER-]: Secondary | ICD-10-CM | POA: Diagnosis not present

## 2024-02-29 DIAGNOSIS — G4730 Sleep apnea, unspecified: Secondary | ICD-10-CM | POA: Diagnosis not present

## 2024-02-29 DIAGNOSIS — Z91040 Latex allergy status: Secondary | ICD-10-CM | POA: Insufficient documentation

## 2024-02-29 DIAGNOSIS — Z87891 Personal history of nicotine dependence: Secondary | ICD-10-CM | POA: Insufficient documentation

## 2024-02-29 DIAGNOSIS — Z8261 Family history of arthritis: Secondary | ICD-10-CM | POA: Insufficient documentation

## 2024-02-29 DIAGNOSIS — Z825 Family history of asthma and other chronic lower respiratory diseases: Secondary | ICD-10-CM | POA: Insufficient documentation

## 2024-02-29 DIAGNOSIS — Z813 Family history of other psychoactive substance abuse and dependence: Secondary | ICD-10-CM | POA: Insufficient documentation

## 2024-02-29 DIAGNOSIS — Z818 Family history of other mental and behavioral disorders: Secondary | ICD-10-CM | POA: Insufficient documentation

## 2024-02-29 DIAGNOSIS — E669 Obesity, unspecified: Secondary | ICD-10-CM | POA: Insufficient documentation

## 2024-02-29 DIAGNOSIS — I890 Lymphedema, not elsewhere classified: Secondary | ICD-10-CM | POA: Insufficient documentation

## 2024-02-29 DIAGNOSIS — Z90710 Acquired absence of both cervix and uterus: Secondary | ICD-10-CM | POA: Insufficient documentation

## 2024-02-29 DIAGNOSIS — Z83438 Family history of other disorder of lipoprotein metabolism and other lipidemia: Secondary | ICD-10-CM | POA: Insufficient documentation

## 2024-02-29 DIAGNOSIS — Z9049 Acquired absence of other specified parts of digestive tract: Secondary | ICD-10-CM | POA: Insufficient documentation

## 2024-02-29 DIAGNOSIS — Z800 Family history of malignant neoplasm of digestive organs: Secondary | ICD-10-CM | POA: Insufficient documentation

## 2024-02-29 MED ORDER — NYSTATIN 100000 UNIT/GM EX POWD: 1.0000 | Freq: Three times a day (TID) | CUTANEOUS | 1 refills | Status: AC

## 2024-02-29 NOTE — Assessment & Plan Note (Addendum)
 This is a very pleasant 63 year old female patient with past medical history significant for congestive heart failure, coronary artery disease, diabetes, hypertension, dyslipidemia history of pulmonary embolism back in 2008 for which she needed anticoagulation, rheumatoid arthritis, obstructive sleep apnea diagnosed with right breast IDC.   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  She is now s/p 4 cycles of TC and adjuvant radiation. No role for anti estrogen therapy since she had triple neg breast cancer.  Assessment and Plan Assessment & Plan History of triple negative breast cancer, status post treatment, without evidence of recurrence Occasional breast aching without significant concern. - Ordered mammogram for May.  Lymphedema of lower extremities Managed with machine therapy, providing some relief. Occasional heaviness reported.  Intertrigo under right breast Mild rash likely due to moisture and sweat, consistent with intertrigo. - Prescribed nystatin powder to apply under the breast twice daily.  Back pain with possible sciatica Back pain radiating down the leg, suggestive of sciatica. Symptoms improved today. Ok to monitor  RTC with me in 6 months

## 2024-02-29 NOTE — Progress Notes (Signed)
 [facility] CONSULT NOTE  Patient Care Team: Arloa Jarvis, NP as PCP - General (Nurse Practitioner) Delford Maude BROCKS, MD as PCP - Cardiology (Cardiology) Tyree Nanetta SAILOR, RN as Oncology Nurse Navigator Belinda Cough, MD as Consulting Physician (General Surgery) Loretha Ash, MD as Consulting Physician (Hematology and Oncology) Dewey Rush, MD as Consulting Physician (Radiation Oncology) Duke, Jon Garre, [place] as Physician Assistant (Cardiology)  CHIEF COMPLAINTS/PURPOSE OF CONSULTATION:  Breast cancer follow-up   HISTORY OF PRESENTING ILLNESS:   [name] 63 y.o. female is here because of recent diagnosis of right breast IDC  I reviewed her records extensively and collaborated the history with the patient.  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 Ultrasound of the right axilla is negative.   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  Report date is November 21, 2021.   The CustomNext-Cancer+RNAinsight panel offered by [employer] includes sequencing and rearrangement analysis for the following 47 genes:  APC, ATM, AXIN2, BARD1, BMPR1A, BRCA1, BRCA2, BRIP1, CDH1, CDK4, CDKN2A, CHEK2, DICER1, EPCAM, GREM1, HOXB13, MEN1, MLH1, MSH2, MSH3, MSH6, MUTYH, NBN, NF1, NF2, NTHL1, PALB2, PMS2, POLD1, POLE, PTEN, RAD51C, RAD51D, RECQL, RET, SDHA, SDHAF2, SDHB, SDHC, SDHD, SMAD4, SMARCA4, STK11, TP53, TSC1, TSC2, and VHL.  RNA data is routinely analyzed for use in variant interpretation for all genes.   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  Prior prognostic showed ER 0% negative PR 0% negative, HER2 low with IHC of 1+ and Ki-67 of 25%.   12/30/2021 - 03/06/2022 Chemotherapy   Patient is on Treatment Plan : BREAST TC q21d     03/20/2022 Cancer Staging   Staging form: Breast, AJCC 8th Edition - Pathologic: Stage IB (pT1c, pN0(sn), cM0, G3, ER-, PR-, HER2-) - Signed by Loretha Ash, MD on 02/29/2024 Stage prefix: Initial diagnosis Method of lymph node assessment: Sentinel lymph node biopsy Histologic grading system: 3 grade system    Interval History  Discussed the use of AI scribe software for clinical note transcription with the patient, who gave verbal consent to proceed.  History of Present Illness [name] is a 63 year old female with triple negative breast cancer who presents with back pain and lymphedema of legs.  She experiences back pain radiating down her backside and leg. The pain has been persistent but is better today. It  has limited her ability to exercise.  She has a history of lymphedema, which she manages with a machine. The lymphedema sometimes causes her limbs to feel  heavy.  She underwent a mammogram in May, which showed no abnormalities in either breast. She has not noticed any changes in her breasts since the last visit, although she occasionally experiences aching.  She is concerned about her hair growth, noting that it is growing slowly in some areas. She has been hesitant to use chemical treatments like hair color or perms due to concerns about their effects.  She mentions having a mild rash under her breast.   Rest of the pertinent 10 point ROS reviewed and negative  MEDICAL HISTORY:  Past Medical History:  Diagnosis Date   Allergy    Anginal pain    a. NL cath in 2008;  b. Myoview  03/2011: dec uptake along mid anterior wall on stress imaging -> ? attenuation vs. ischemia, EF 65%;  c. 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 2008 -> coumadin x 6 mos.   Rheumatoid arthritis (HCC)    Sleep apnea    on CPAP 02/2018   SOB (shortness of breath)    Thyroid  disease    TIA (transient ischemic attack)    Urinary urgency    Vitamin D  deficiency     SURGICAL HISTORY: Past Surgical History:  Procedure Laterality Date   ABDOMINAL HYSTERECTOMY  2005   APPENDECTOMY     BREAST LUMPECTOMY WITH RADIOACTIVE SEED AND SENTINEL LYMPH NODE BIOPSY Right 11/16/2021    Procedure: RIGHT BREAST RADIOACTIVE SEED LOCALIZED LUMPECTOMY WITH RIGHT AXILLARY SENTINEL LYMPH NODE BIOPSY;  Surgeon: Belinda Cough, MD;  Location: [facility];  Service: General;  Laterality: Right;  LMA & PEC BLOCK   CARDIAC CATHETERIZATION     Normal   CARDIAC CATHETERIZATION N/A 09/30/2014   Procedure: Left Heart Cath and Coronary Angiography;  Surgeon: Ozell Fell, MD;  Location: [facility] INVASIVE CV LAB;  Service: Cardiovascular;  Laterality: N/A;   LAPAROSCOPIC APPENDECTOMY N/A 06/03/2012   Procedure: APPENDECTOMY LAPAROSCOPIC;  Surgeon: Jina Nephew, MD;  Location: [facility] OR;  Service: General;  Laterality: N/A;   Left knee surgery  2008   LEG SURGERY     RIGHT/LEFT HEART CATH AND CORONARY ANGIOGRAPHY N/A 01/10/2023   Procedure: RIGHT/LEFT HEART CATH AND CORONARY ANGIOGRAPHY;  Surgeon: Wendel Lurena POUR, MD;  Location: [facility] INVASIVE CV LAB;  Service: Cardiovascular;  Laterality: N/A;   TONSILLECTOMY  TOTAL HIP ARTHROPLASTY Left 06/11/2018   Procedure: LEFT TOTAL HIP ARTHROPLASTY ANTERIOR APPROACH;  Surgeon: Addie Cordella Hamilton, MD;  Location: [facility] OR;  Service: Orthopedics;  Laterality: Left;   TOTAL KNEE ARTHROPLASTY Left 08/22/2016   Procedure: TOTAL KNEE ARTHROPLASTY;  Surgeon: Addie Hamilton Cordella, MD;  Location: [facility] OR;  Service: Orthopedics;  Laterality: Left;   TUBAL LIGATION  1989    SOCIAL HISTORY: Social History   Socioeconomic History   Marital status: Divorced    Spouse name: Not on file   Number of children: 3   Years of education: Not on file   Highest education level: Not on file  Occupational History   Occupation: UNEMPLOYED    Employer: DISABLED  Tobacco Use   Smoking status: Former    Current packs/day: 0.00    Average packs/day: 0.5 packs/day for 15.0 years (7.5 ttl pk-yrs)    Types: Cigarettes    Start date: 04/24/1970    Quit date: 04/24/1985    Years since quitting: 38.8   Smokeless tobacco: Never  Vaping Use   Vaping status: Never Used  Substance  and Sexual Activity   Alcohol use: No    Alcohol/week: 0.0 standard drinks of alcohol   Drug use: Not Currently    Types: Marijuana   Sexual activity: Not Currently    Birth control/protection: Surgical    Comment: hyst  Other Topics Concern   Not on file  Social History Narrative   Lives in [place] by herself.  Disabled.  Occasional Tea .  3 kids, 6 grandkids.     Water aerobics 3 times a week.   Social Drivers of [employer]er Strain: High Risk (03/27/2022)   Overall Financial Resource Strain (CARDIA)    Difficulty of Paying Living Expenses: Very hard  Food Insecurity: Food Insecurity Present (03/27/2022)   Hunger Vital Sign    Worried About Running Out of Food in the Last Year: Sometimes true    Ran Out of Food in the Last Year: Sometimes true  Transportation Needs: No Transportation Needs (03/27/2022)   PRAPARE - Administrator, Civil Service (Medical): No    Lack of Transportation (Non-Medical): No  Physical Activity: Not on file  Stress: Not on file  Social Connections: Not on file  Intimate Partner Violence: Not At Risk (01/18/2024)   Received from [facility]   HITS    Over the last 12 months how often did your partner physically hurt you?: Never    Over the last 12 months how often did your partner insult you or talk down to you?: Never    Over the last 12 months how often did your partner threaten you with physical harm?: Never    Over the last 12 months how often did your partner scream or curse at you?: Never    FAMILY HISTORY: Family History  Problem Relation Age of Onset   Emphysema Mother    Arthritis Mother    Heart failure Mother        alive @ 45   Stroke Mother    Diabetes Mother    Hypertension Mother    Hyperlipidemia Mother    Depression Mother    Anxiety disorder Mother    Heart disease Mother    Alcoholism Mother    Depression Father    Heart disease Father        died @ 70's.   Stroke Father    Diabetes Father     Hyperlipidemia  Father    Hypertension Father    Bipolar disorder Father    Sleep apnea Father    Alcoholism Father    Drug abuse Father    Sudden death Father    Diabetes Sister    Asthma Brother    Colon cancer Maternal Grandfather        dx after 50   Heart disease Paternal Grandfather    Breast cancer Maternal Aunt 14 - 40   Breast cancer Cousin        dx 67s   Asthma Daughter    Esophageal cancer Neg Hx    Stomach cancer Neg Hx    Rectal cancer Neg Hx     ALLERGIES:  is allergic to shellfish allergy, hydrocodone -acetaminophen , penicillins, gabapentin , latex, and sulfa antibiotics.  MEDICATIONS:  Current Outpatient Medications  Medication Sig Dispense Refill   ACCU-CHEK GUIDE test strip      Accu-Chek Softclix Lancets lancets SMARTSIG:Topical     albuterol  (PROVENTIL  HFA;VENTOLIN  HFA) 108 (90 Base) MCG/ACT inhaler Inhale 2 puffs into the lungs every 6 (six) hours as needed for wheezing or shortness of breath.     albuterol  (VENTOLIN  HFA) 108 (90 Base) MCG/ACT inhaler Inhale 1-2 puffs into the lungs every 6 (six) hours as needed for wheezing or shortness of breath. 1 each 0   atorvastatin  (LIPITOR) 40 MG tablet Take 40 mg by mouth at bedtime.      Blood Glucose Monitoring Suppl (ACCU-CHEK GUIDE ME) w/Device KIT daily.     busPIRone  (BUSPAR ) 30 MG tablet Take 30 mg by mouth 2 (two) times daily.     carvedilol  (COREG ) 3.125 MG tablet Take 1 tablet (3.125 mg total) by mouth 2 (two) times daily. 180 tablet 3   cyclobenzaprine  (FLEXERIL ) 10 MG tablet Take 1 tablet (10 mg total) by mouth 2 (two) times daily as needed for muscle spasms. 20 tablet 0   diclofenac  Sodium (VOLTAREN ) 1 % GEL Apply 2 g topically 4 (four) times daily as needed (joint/muscle pain).     empagliflozin  (JARDIANCE ) 10 MG TABS tablet Take 1 tablet (10 mg total) by mouth daily. 30 tablet 0   EPINEPHrine  0.3 mg/0.3 mL IJ SOAJ injection Inject 0.3 mg into the muscle as needed for anaphylaxis. Follow package  instructions as needed for severe allergy or anaphylactic reaction. 2 each 3   fluticasone  furoate-vilanterol (BREO ELLIPTA ) 100-25 MCG/ACT AEPB Inhale 1 puff into the lungs daily. 1 each 5   furosemide  (LASIX ) 40 MG tablet Take 1 tablet (40 mg total) by mouth daily as needed for edema or fluid (take in case of weight gain 2 to 3 lbs in 24 hrs or 5 lbs in 7 days.). 30 tablet 0   hydrocortisone 2.5 % cream SMARTSIG:sparingly Topical Twice Daily PRN     hydrOXYzine  (VISTARIL ) 25 MG capsule Take 25 mg by mouth 3 (three) times daily as needed for itching.     ipratropium-albuterol  (DUONEB) 0.5-2.5 (3) MG/3ML SOLN Take 3 mLs by nebulization every 6 (six) hours as needed (shortness of breath).     KLOR-CON  M10 10 MEQ tablet Take 10 mEq by mouth 2 (two) times daily.     lidocaine  (LIDODERM ) 5 % APPLY 1 PATCH BY TOPICAL ROUTE ONCE DAILY (MAY WEAR UP TO 12HOURS.)     loperamide  (IMODIUM ) 2 MG capsule Take 1 capsule (2 mg total) by mouth 4 (four) times daily as needed for diarrhea or loose stools. 12 capsule 0   losartan  (COZAAR ) 25 MG tablet Take 1 tablet (  25 mg total) by mouth daily. 90 tablet 1   metFORMIN  (GLUCOPHAGE -XR) 500 MG 24 hr tablet Take 1 tablet (500 mg total) by mouth daily with breakfast. 30 tablet 0   Multiple Vitamin (MULTIVITAMIN) tablet Take 1 tablet by mouth daily.     mupirocin ointment (BACTROBAN) 2 % Apply 1 Application topically 2 (two) times daily.     nitroGLYCERIN  (NITROSTAT ) 0.3 MG SL tablet Place 0.3 mg under the tongue every 5 (five) minutes as needed for chest pain.     nystatin (MYCOSTATIN/NYSTOP) powder Apply 1 application topically 3 (three) times daily as needed for rash.     nystatin (MYCOSTATIN/NYSTOP) powder Apply 1 Application topically 3 (three) times daily. 15 g 1   ondansetron  (ZOFRAN -ODT) 4 MG disintegrating tablet Take 1 tablet (4 mg total) by mouth every 8 (eight) hours as needed for nausea or vomiting. 20 tablet 0   pantoprazole  (PROTONIX ) 40 MG tablet Take 40 mg  by mouth daily.     predniSONE  (DELTASONE ) 10 MG tablet Take 10 mg by mouth.     RYBELSUS 7 MG TABS TAKE 1 TABLET BY MOUTH IN THE MORNING DAILY FOR DIABETES.     sertraline  (ZOLOFT ) 25 MG tablet Take 1 tablet (25 mg total) by mouth daily. 90 tablet 0   spironolactone  (ALDACTONE ) 25 MG tablet Take 25 mg by mouth every other day.     triamcinolone  ointment (KENALOG ) 0.1 % APPLY A THIN LAYER TO THE AFFECTED AREA(S) ON LEFT ARM BY TOPICAL ROUTE 2 TIMES PER DAY FOR 7 DAYS     Vitamin D , Ergocalciferol , (DRISDOL ) 1.25 MG (50000 UNIT) CAPS capsule Take 1 capsule (50,000 Units total) by mouth every 7 (seven) days. 4 capsule 0   aspirin  81 MG chewable tablet Chew 81 mg by mouth daily. (Patient not taking: Reported on 02/29/2024)     BIOTIN PO Take 1 tablet by mouth at bedtime. (Patient not taking: Reported on 02/29/2024)     clindamycin  (CLEOCIN ) 150 MG capsule TAKE 4 CAPSULES 1 HOUR PRIOR TO PROCEDURE (Patient not taking: Reported on 02/29/2024)     ibuprofen  (ADVIL ) 800 MG tablet TAKE 1 TABLET BY MOUTH FOUR TIMES A DAY AS NEEDED FOR PAIN (Patient not taking: Reported on 02/29/2024)     nitroGLYCERIN  (NITROSTAT ) 0.4 MG SL tablet PLACE 1 TABLET UNDER THE TONGUE EVERY 5 MINUTES AS NEEDED FOR CHEST PAIN. (Patient not taking: Reported on 02/29/2024)     ondansetron  (ZOFRAN ) 8 MG tablet PLEASE SEE ATTACHED FOR DETAILED DIRECTIONS (Patient not taking: Reported on 02/29/2024)     No current facility-administered medications for this visit.    PHYSICAL EXAMINATION: ECOG PERFORMANCE STATUS: 1 - Symptomatic but completely ambulatory  Vitals:   02/29/24 1115  BP: 130/78  Pulse: 78  Resp: 18  Temp: 98.3 F (36.8 C)  SpO2: 99%      Filed Weights   02/29/24 1115  Weight: 200 lb 3.2 oz (90.8 kg)     Physical Exam Constitutional:      Appearance: Normal appearance.  Cardiovascular:     Rate and Rhythm: Normal rate and regular rhythm.  Pulmonary:     Effort: Pulmonary effort is normal.     Breath  sounds: Normal breath sounds.  Chest:     Comments: Bilateral breasts inspected and palpated. No palpable masses or regional adenopathy Abdominal:     General: Abdomen is flat.     Palpations: Abdomen is soft.  Musculoskeletal:        General: Swelling: Bilateral symmetrical  lower extremity swelling improved/resolved.     Cervical back: Normal range of motion and neck supple. No rigidity.  Lymphadenopathy:     Cervical: No cervical adenopathy.  Skin:    General: Skin is warm and dry.  Neurological:     General: No focal deficit present.     Mental Status: She is alert.      LABORATORY DATA:  I have reviewed the data as listed Lab Results  Component Value Date   WBC 5.3 11/12/2023   HGB 13.0 11/12/2023   HCT 40.3 11/12/2023   MCV 92.6 11/12/2023   PLT 195 11/12/2023   Lab Results  Component Value Date   NA 142 02/19/2024   K 4.3 02/19/2024   CL 104 02/19/2024   CO2 24 02/19/2024    RADIOGRAPHIC STUDIES: I have personally reviewed the radiological reports and agreed with the findings in the report.  ASSESSMENT AND PLAN:   Malignant neoplasm of upper-outer quadrant of female breast [facility]) This is a very pleasant 63 year old female patient with past medical history significant for congestive heart failure, coronary artery disease, diabetes, hypertension, dyslipidemia history of pulmonary embolism back in 2008 for which she needed anticoagulation, rheumatoid arthritis, obstructive sleep apnea diagnosed with right breast IDC.   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  She is now s/p 4 cycles of TC and adjuvant radiation. No role for anti estrogen therapy since she had triple neg breast cancer.  Assessment and Plan Assessment & Plan History of triple negative breast cancer, status post treatment, without evidence of recurrence Occasional breast aching without significant concern. - Ordered mammogram for  May.  Lymphedema of lower extremities Managed with machine therapy, providing some relief. Occasional heaviness reported.  Intertrigo under right breast Mild rash likely due to moisture and sweat, consistent with intertrigo. - Prescribed nystatin powder to apply under the breast twice daily.  Back pain with possible sciatica Back pain radiating down the leg, suggestive of sciatica. Symptoms improved today. Ok to monitor  RTC with me in 6 months      Total time spent: 30 minutes including history, physical exam, review of records, counseling and coordination of care All questions were answered. The patient knows to call the clinic with any problems, questions or concerns.    Amber Stalls, MD 02/29/24

## 2024-03-03 ENCOUNTER — Ambulatory Visit: Admitting: Podiatry

## 2024-03-03 ENCOUNTER — Encounter: Payer: Self-pay | Admitting: Podiatry

## 2024-03-03 ENCOUNTER — Ambulatory Visit
Admission: RE | Admit: 2024-03-03 | Discharge: 2024-03-03 | Disposition: A | Source: Ambulatory Visit | Attending: Nurse Practitioner

## 2024-03-03 ENCOUNTER — Ambulatory Visit: Payer: Self-pay | Admitting: Emergency Medicine

## 2024-03-03 ENCOUNTER — Other Ambulatory Visit: Payer: Self-pay

## 2024-03-03 VITALS — Ht <= 58 in | Wt 200.2 lb

## 2024-03-03 DIAGNOSIS — B49 Unspecified mycosis: Secondary | ICD-10-CM | POA: Diagnosis not present

## 2024-03-03 DIAGNOSIS — L309 Dermatitis, unspecified: Secondary | ICD-10-CM

## 2024-03-03 DIAGNOSIS — M549 Dorsalgia, unspecified: Secondary | ICD-10-CM

## 2024-03-03 DIAGNOSIS — I509 Heart failure, unspecified: Secondary | ICD-10-CM

## 2024-03-03 DIAGNOSIS — I2510 Atherosclerotic heart disease of native coronary artery without angina pectoris: Secondary | ICD-10-CM

## 2024-03-03 DIAGNOSIS — E7849 Other hyperlipidemia: Secondary | ICD-10-CM

## 2024-03-03 MED ORDER — TERBINAFINE HCL 250 MG PO TABS
250.0000 mg | ORAL_TABLET | Freq: Every day | ORAL | 0 refills | Status: AC
Start: 2024-03-03 — End: ?

## 2024-03-05 NOTE — Progress Notes (Signed)
 Subjective:   Patient ID: [name], female   DOB: 63 y.o.   MRN: [id number]   HPI Patient presents with a lot of itching of his left foot and states that it is been very dry also and he is trying creams without resolution of symptoms   ROS      Objective:  Physical Exam  Neurovascular status intact with patient found to have red irritation of the inside of the left arch and also quite a bit of dryness associated with it localized to this area.  Patient has good digital perfusion well-oriented     Assessment:  Probability of a moderate fungal infection with also extremely dry skin     Plan:  H&P reviewed both conditions with him and for dry skin we will initiate Vaseline or Aquaphor using Saran wrap and white socks.  I then placed him on 30 days of Lamisil oral to try to reduce fungal infiltration he did have blood work which was normal and I then recommended Lotrisone cream to try to help with the itch component.  Patient to be seen back as needed

## 2024-03-12 ENCOUNTER — Ambulatory Visit: Admitting: Podiatry

## 2024-03-24 ENCOUNTER — Encounter: Payer: Self-pay | Admitting: Podiatry

## 2024-03-24 ENCOUNTER — Ambulatory Visit (INDEPENDENT_AMBULATORY_CARE_PROVIDER_SITE_OTHER): Admitting: Podiatry

## 2024-03-24 DIAGNOSIS — L309 Dermatitis, unspecified: Secondary | ICD-10-CM | POA: Diagnosis not present

## 2024-03-24 DIAGNOSIS — M76821 Posterior tibial tendinitis, right leg: Secondary | ICD-10-CM

## 2024-03-24 DIAGNOSIS — M76822 Posterior tibial tendinitis, left leg: Secondary | ICD-10-CM | POA: Diagnosis not present

## 2024-03-24 DIAGNOSIS — B49 Unspecified mycosis: Secondary | ICD-10-CM | POA: Diagnosis not present

## 2024-03-24 NOTE — Progress Notes (Signed)
 Subjective:   Patient ID: [name], female   DOB: 63 y.o.   MRN: [id number]   HPI Patient states that she is improving but still having some itchy and states that she is still taking the medicine and has not completed up to this point F2 neurovascular status intact muscle strength found to be adequate range of motion adequate with patient's left foot improving diminished redness diminished blistering with patient still taking oral antifungal agent   ROS      Objective:  Physical Exam  Physical exam listed above     Assessment:  Reviewed condition reviewed fungal therapy and continuation of oral medicine.  I did review everything as outlined with the patient     Plan:  Satisfied with how she is doing and patient will be seen back as symptoms indicate and may require dermatologist if symptoms were to get worse.  I want her to continue but she will go to just once a week on the Aquaphor and Saran wrap treatment at nighttime

## 2024-03-24 NOTE — Progress Notes (Signed)
less

## 2024-03-27 ENCOUNTER — Encounter: Admitting: Dietician

## 2024-03-27 ENCOUNTER — Ambulatory Visit (INDEPENDENT_AMBULATORY_CARE_PROVIDER_SITE_OTHER): Payer: Self-pay | Admitting: Adult Health

## 2024-03-27 ENCOUNTER — Encounter (INDEPENDENT_AMBULATORY_CARE_PROVIDER_SITE_OTHER): Payer: Self-pay | Admitting: Adult Health

## 2024-03-27 VITALS — BP 111/79 | HR 84 | Temp 98.7°F | Ht 59.0 in | Wt 195.0 lb

## 2024-03-27 DIAGNOSIS — I5032 Chronic diastolic (congestive) heart failure: Secondary | ICD-10-CM

## 2024-03-27 DIAGNOSIS — Z6841 Body Mass Index (BMI) 40.0 and over, adult: Secondary | ICD-10-CM

## 2024-03-27 DIAGNOSIS — E1159 Type 2 diabetes mellitus with other circulatory complications: Secondary | ICD-10-CM

## 2024-03-27 DIAGNOSIS — I152 Hypertension secondary to endocrine disorders: Secondary | ICD-10-CM | POA: Diagnosis not present

## 2024-03-27 DIAGNOSIS — E559 Vitamin D deficiency, unspecified: Secondary | ICD-10-CM

## 2024-03-27 DIAGNOSIS — E669 Obesity, unspecified: Secondary | ICD-10-CM

## 2024-03-27 DIAGNOSIS — Z7985 Long-term (current) use of injectable non-insulin antidiabetic drugs: Secondary | ICD-10-CM

## 2024-03-27 DIAGNOSIS — E1165 Type 2 diabetes mellitus with hyperglycemia: Secondary | ICD-10-CM | POA: Diagnosis not present

## 2024-03-27 MED ORDER — METFORMIN HCL ER 500 MG PO TB24: 500.0000 mg | ORAL_TABLET | Freq: Every day | ORAL | 0 refills | Status: DC

## 2024-03-27 MED ORDER — VITAMIN D (ERGOCALCIFEROL) 1.25 MG (50000 UNIT) PO CAPS: 50000.0000 [IU] | ORAL_CAPSULE | ORAL | 0 refills | Status: DC

## 2024-03-27 NOTE — Progress Notes (Signed)
 WEIGHT SUMMARY AND BIOMETRICS  No data recorded Anthropometric Measurements Height: 4' 11 (1.499 m) Weight at Last Visit: 198lb Starting Weight: 209lb   No data recorded Other Clinical Data Fasting: No Labs: No Today's Visit #: 31 Starting Date: 06/01/21    Chief Complaint:   OBESITY [name] is here to discuss her progress with her obesity treatment plan.  She is on the the Category 2 Plan and states she is following her eating plan approximately 70 % of the time.  She states she is exercising Walking 20 minutes 2 times per week.  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 Chronic diastolic congestive heart failure (HCC) Discussed Labs 02/19/2024 CMP: Electrolytes stable Kidney fx improved ALT only slightly elevated, AST and Alk Phos- normal  2. Type 2 diabetes mellitus with hyperglycemia, without long-term current use of insulin  (HCC) Discussed Labs  Latest Reference Range & Units 02/19/24 11:19  Glucose 70 - 99 mg/dL 897 (H)  Hemoglobin J8R 4.8 - 5.6 % 6.4 (H)  Est. average glucose Bld gHb Est-mCnc mg/dL 862  INSULIN  2.6 - 24.9 uIU/mL 29.5 (H)  (H): Data is abnormally high  CBG and Insulin  level- elevated A1c at goal B12 level stable  PCP manages daily Rybelsus 7mg  HFpEF 10 mg Jardiance  HWW manages daily Metfomin XR 500mg    Fasting CBG will range from 110s- low 140s  Of Note- unable to tolerate Mounjaro  5mg  due to sig injection site reactions  Vitamin B12 232 - 1,245 pg/mL 485   3. Hypertension associated with type 2 diabetes mellitus (HCC) Discussed Labs 02/19/2024 CMP: Electrolytes stable Kidney fx improved ALT only slightly elevated, AST and Alk Phos-  normal  4. Vitamin D  deficiency Discussed Labs  Latest Reference Range & Units 02/19/24 11:19  Vitamin D , 25-Hydroxy 30.0 - 100.0 ng/mL 54.9       Vit D Level stable and at goal  Assessment/Plan:   1. Chronic diastolic congestive heart failure (HCC) Continue healthy eating and regular walking  2. Type 2 diabetes mellitus with hyperglycemia, without long-term current use of insulin  (HCC) (Primary) Refill  metFORMIN  (GLUCOPHAGE -XR) 500 MG 24 hr tablet Take 1 tablet (500 mg total) by mouth daily with breakfast. Dispense: 30 tablet, Refills: 0 ordered   3. Hypertension associated with type 2 diabetes mellitus (HCC) Continue healthy eating and regular walking  4. Vitamin D  deficiency Refill  Vitamin D , Ergocalciferol , (DRISDOL ) 1.25 MG (50000 UNIT) CAPS capsule Take 1 capsule (50,000 Units total) by mouth every 7 (seven) days. Dispense: 4 capsule, Refills: 0 ordered   5. BMI 40.0-44.9, adult (HCC) Current BMI 40.0  [name] is currently in the action stage of change. As such, her goal is to continue with weight loss efforts. She has agreed to the Category 2 Plan.   Exercise goals: All adults should avoid inactivity. Some physical activity is better than none, and adults who participate in any amount of physical activity gain some health benefits. Adults should also include muscle-strengthening activities that involve all major muscle groups on 2 or more days a week.  Behavioral modification strategies: increasing lean protein intake, decreasing simple carbohydrates, increasing vegetables, increasing water intake, no skipping meals, meal planning  and cooking strategies, keeping healthy foods in the home, ways to avoid boredom eating, holiday eating strategies , and planning for success.  [name] has agreed to follow-up with our clinic in 4 weeks. She was informed of the importance of frequent follow-up visits to maximize her success with intensive lifestyle modifications for her multiple  health conditions.   Objective:   Height 4' 11 (1.499 m). Body mass index is 40.44 kg/m.  General: Cooperative, alert, well developed, in no acute distress. HEENT: Conjunctivae and lids unremarkable. Cardiovascular: Regular rhythm.  Lungs: Normal work of breathing. Neurologic: No focal deficits.   Lab Results  Component Value Date   CREATININE 0.99 02/19/2024   BUN 17 02/19/2024   NA 142 02/19/2024   K 4.3 02/19/2024   CL 104 02/19/2024   CO2 24 02/19/2024   Lab Results  Component Value Date   ALT 33 (H) 02/19/2024   AST 21 02/19/2024   ALKPHOS 62 02/19/2024   BILITOT 0.6 02/19/2024   Lab Results  Component Value Date   HGBA1C 6.4 (H) 02/19/2024   HGBA1C 6.5 (H) 10/09/2023   HGBA1C 7.1 (H) 05/07/2023   HGBA1C 7.9 (H) 12/11/2022   HGBA1C 7.3 (H) 07/15/2022   Lab Results  Component Value Date   INSULIN  29.5 (H) 02/19/2024   INSULIN  19.1 10/09/2023   INSULIN  20.2 06/05/2023   INSULIN  13.4 08/14/2022   INSULIN  22.1 09/28/2021   Lab Results  Component Value Date   TSH 0.41 01/22/2024   Lab Results  Component Value Date   CHOL 140 02/28/2024   HDL 50 02/28/2024   LDLCALC 70 02/28/2024   TRIG 111 02/28/2024   CHOLHDL 2.8 02/28/2024   Lab Results  Component Value Date   VD25OH 54.9 02/19/2024   VD25OH 46.6 10/09/2023   VD25OH 51.0 05/07/2023   Lab Results  Component Value Date   WBC 5.3 11/12/2023   HGB 13.0 11/12/2023   HCT 40.3 11/12/2023   MCV 92.6 11/12/2023   PLT 195 11/12/2023   No results found for: IRON, TIBC, FERRITIN  Attestation Statements:   Reviewed by clinician on day of visit: allergies, medications, problem list, medical history, surgical history, family history, social history, and previous encounter notes.  I have reviewed the above documentation for accuracy and completeness, and I agree with the above. -  Tonia Avino d. Teleshia Lemere, NP-C

## 2024-03-30 ENCOUNTER — Other Ambulatory Visit: Payer: Self-pay | Admitting: Podiatry

## 2024-03-31 ENCOUNTER — Other Ambulatory Visit: Payer: Self-pay | Admitting: Internal Medicine

## 2024-04-22 ENCOUNTER — Other Ambulatory Visit: Payer: Self-pay | Admitting: Orthopedic Surgery

## 2024-05-05 ENCOUNTER — Encounter: Attending: Emergency Medicine | Admitting: Dietician

## 2024-05-05 VITALS — Wt 200.0 lb

## 2024-05-05 DIAGNOSIS — E1165 Type 2 diabetes mellitus with hyperglycemia: Secondary | ICD-10-CM | POA: Diagnosis present

## 2024-05-05 NOTE — Progress Notes (Signed)
 Medical Nutrition Therapy  Appointment Start time:  1530  Appointment End time:  1645  Primary concerns today: Patient states that she goes to [facility] and would like more individualized care rather than a lot of do's and don'ts.  She states that she has been getting conflicting advice.  She would like to eat better.  She is having a lumbar fusion and states that she will go to rehab after this to recover and states that she will not be cooking much for a while. Some depression from family situation and pain.   Referral diagnosis: Type 2 Diabetes, CAD, Heart Failure, HLD Preferred learning style: no preference indicated Learning readiness: ready   NUTRITION ASSESSMENT  64 200 lbs 05/06/2023 209 lbs - starting weight  Clinical Medical Hx: Type 2 diabetes, HLD, CAD, GERD, breast cancer history, OS (c-pap), HTN, CHF, IBS, thyroid  nodules, hair loss, depression Medications: Lasix  prn, spironolactone , Rybelsus, metformin , Jardiance , (got blisters when she took Mounjaro ) Labs: A1C 6.4% 02/19/2024, Vitamin D  55 02/19/2024, vitamin B-12 Notable Signs/Symptoms: now  Lifestyle & Dietary Hx Patient lives alone. 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  She is also a optician, dispensing. Since she has had cancer, if she is hungry, she can't eat.   Allergic to shellfish. Avoids added salt and added sugar.  Estimated daily fluid intake: 32-64 oz daily Supplements: vitamin D , MVI at times Sleep: better - 7 hours Stress / self-care: very high due to pain Current average weekly physical activity: Walking (small amount) but now using a cane and back surgery next week.  She would like to dance again.  24-Hr Dietary Recall First Meal: eggs, grits, bacon, 4 oz OJ OR [employer] Snack: none Second Meal: ham sandwich with mayo on [employer] bread, OJ  Snack: none Third Meal: spaghetti with sausage sauce, corn on cob (1/2),  Snack: none Beverages: water, hot green tea (unsweetened), OJ,  occasional sweet tea, occasional regular soda  Estimated Energy Needs per day Calories: 1200 for weight loss (1600 for healing) Protein: 70g  NUTRITION DIAGNOSIS  NB-1.1 Food and nutrition-related knowledge deficit As related to meal planning/balance of protein, calories, and fat.  As evidenced by diet hx and patient report.   NUTRITION INTERVENTION  Nutrition education (E-1) on the following topics:  Meal planning Balance of plate Low sodium, low fat Nutrition for healing  Handouts Provided Include  1200 calorie sample meal plans from AND (5 days) Balanced carbohydrate heart healthy nutrition therapy from AND Seasoning tips from AND  Learning Style & Readiness for Change Teaching method utilized: Visual & Auditory  Demonstrated degree of understanding via: Teach Back  Barriers to learning/adherence to lifestyle change: back pain  Goals Established by Pt Continue low sodium, low fat diet Mindful eating Balanced plate (focus on protein, and vegetables, small portions carbohydrate of your choice)   MONITORING & EVALUATION Dietary intake, weekly physical activity prn  Next Steps  Patient is to call for questions.

## 2024-05-07 ENCOUNTER — Encounter (INDEPENDENT_AMBULATORY_CARE_PROVIDER_SITE_OTHER): Payer: Self-pay | Admitting: Adult Health

## 2024-05-07 ENCOUNTER — Ambulatory Visit (INDEPENDENT_AMBULATORY_CARE_PROVIDER_SITE_OTHER): Admitting: Adult Health

## 2024-05-07 ENCOUNTER — Other Ambulatory Visit: Payer: Self-pay | Admitting: Podiatry

## 2024-05-07 VITALS — BP 114/76 | HR 75 | Temp 98.7°F | Ht 59.0 in | Wt 192.0 lb

## 2024-05-07 DIAGNOSIS — Z6841 Body Mass Index (BMI) 40.0 and over, adult: Secondary | ICD-10-CM

## 2024-05-07 DIAGNOSIS — E1165 Type 2 diabetes mellitus with hyperglycemia: Secondary | ICD-10-CM

## 2024-05-07 DIAGNOSIS — M549 Dorsalgia, unspecified: Secondary | ICD-10-CM | POA: Diagnosis not present

## 2024-05-07 DIAGNOSIS — E559 Vitamin D deficiency, unspecified: Secondary | ICD-10-CM | POA: Diagnosis not present

## 2024-05-07 DIAGNOSIS — G8929 Other chronic pain: Secondary | ICD-10-CM

## 2024-05-07 DIAGNOSIS — Z6838 Body mass index (BMI) 38.0-38.9, adult: Secondary | ICD-10-CM | POA: Diagnosis not present

## 2024-05-07 DIAGNOSIS — Z7984 Long term (current) use of oral hypoglycemic drugs: Secondary | ICD-10-CM | POA: Diagnosis not present

## 2024-05-07 MED ORDER — VITAMIN D (ERGOCALCIFEROL) 1.25 MG (50000 UNIT) PO CAPS: 50000.0000 [IU] | ORAL_CAPSULE | ORAL | 1 refills | Status: AC

## 2024-05-07 MED ORDER — METFORMIN HCL ER 500 MG PO TB24: 500.0000 mg | ORAL_TABLET | Freq: Every day | ORAL | 0 refills | Status: DC

## 2024-05-07 MED ORDER — VITAMIN D (ERGOCALCIFEROL) 1.25 MG (50000 UNIT) PO CAPS: 50000.0000 [IU] | ORAL_CAPSULE | ORAL | 0 refills | Status: DC

## 2024-05-07 MED ORDER — METFORMIN HCL ER 500 MG PO TB24: 500.0000 mg | ORAL_TABLET | Freq: Every day | ORAL | 1 refills | Status: AC

## 2024-05-07 NOTE — Telephone Encounter (Signed)
 No more anti fungal med

## 2024-05-07 NOTE — Progress Notes (Signed)
 "    WEIGHT SUMMARY AND BIOMETRICS  Vitals Temp: 98.7 F (37.1 C) BP: 114/76 Pulse Rate: 75 SpO2: 98 %   Anthropometric Measurements Height: 4' 11 (1.499 m) Weight: 192 lb (87.1 kg) BMI (Calculated): 38.76 Weight at Last Visit: 195lb Weight Lost Since Last Visit: 3lb Weight Gained Since Last Visit: 0lb Starting Weight: 209lb Total Weight Loss (lbs): 17 lb (7.711 kg)   Body Composition  Body Fat %: 45.5 % Fat Mass (lbs): 87.6 lbs Muscle Mass (lbs): 99.4 lbs Total Body Water (lbs): 69.6 lbs Visceral Fat Rating : 15   Other Clinical Data Fasting: yes Labs: No Today's Visit #: 32 Starting Date: 06/01/21    Chief Complaint:   OBESITY [name] is here to discuss her progress with her obesity treatment plan.  She is on the the Category 2 Plan and states she is following her eating plan approximately 70 % of the time.  She states she is exercising Walking 60 minutes 3 times per week.  Interim History:  TLIF scheduled 05/15/2024 She anticipates hospitlaization then transfer to rehab facility before d/c'd home She has an adult daughter who is lives in local area  PCP manages daily Rybelsus 7mg  HFpEF 10 mg Jardiance  HWW manages daily Metfomin XR 500mg    Of Note- unable to tolerate Mounjaro  5mg  due to sig injection site reactions  Subjective:   1. Chronic back pain, unspecified back location, unspecified back pain laterality 05/15/2024  Procedure Laterality Anesthesia  TRANSFORAMINAL LUMBAR INTERBODY FUSION (TLIF) WITH PEDICLE SCREW FIXATION 1 LEVEL Left General  LEFT-SIDED LUMBAR 4- LUMBAR 5 TRANSFORAMINAL LUMBAR INTERBODY FUSION AND DECOMPRESSION WITH INSTRUMENTATION AND ALLOGRAFT       2. Type 2 diabetes mellitus with hyperglycemia, without long-term current use of insulin  (HCC) Fasting CBG < 130 She denies sx's of hypogylcemia PCP manages daily Rybelsus 7mg  HFpEF 10 mg Jardiance  HWW manages daily Metfomin XR 500mg  Denies mass in neck, dysphagia,  dyspepsia, persistent hoarseness, abdominal pain, or N/V/C   3. Vitamin D  deficiency  Latest Reference Range & Units 02/19/24 11:19  Vitamin D , 25-Hydroxy 30.0 - 100.0 ng/mL 54.9   She is on weekly Ergocalciferol - denies N/V/Muscle Weakness  Assessment/Plan:   1. Chronic back pain, unspecified back location, unspecified back pain laterality Increase protein intake  2. Type 2 diabetes mellitus with hyperglycemia, without long-term current use of insulin  (HCC) (Primary) Refill - metFORMIN  (GLUCOPHAGE -XR) 500 MG 24 hr tablet; Take 1 tablet (500 mg total) by mouth daily with breakfast.  Dispense: 30 tablet; Refill: 0 Patient was counseled on the importance of maintaining healthy lifestyle habits, including balanced nutrition, regular physical activity, and behavioral modifications, while taking antiobesity medication.   Patient verbalized understanding that medication is an adjunct to, not a replacement for, lifestyle changes and that the long-term success and weight maintenance depend on continued adherence to these strategies.   3. Vitamin D  deficiency Refill - Vitamin D , Ergocalciferol , (DRISDOL ) 1.25 MG (50000 UNIT) CAPS capsule; Take 1 capsule (50,000 Units total) by mouth every 7 (seven) days.  Dispense: 4 capsule; Refill: 0  4. BMI 40.0-44.9, adult (HCC) Current BMI 38.9  [name] is currently in the action stage of change. As such, her goal is to continue with weight loss efforts. She has agreed to the Category 2 Plan.   Exercise goals: All adults should avoid inactivity. Some physical activity is better than none, and adults who participate in any amount of physical activity gain some health benefits.  Behavioral modification strategies: increasing lean protein intake, decreasing  simple carbohydrates, increasing vegetables, increasing water intake, meal planning and cooking strategies, keeping healthy foods in the home, ways to avoid boredom eating, ways to avoid night time snacking,  and planning for success.  [name] has agreed to follow-up with our clinic in 8 weeks. She was informed of the importance of frequent follow-up visits to maximize her success with intensive lifestyle modifications for her multiple health conditions.   Objective:   Blood pressure 114/76, pulse 75, temperature 98.7 F (37.1 C), height 4' 11 (1.499 m), weight 192 lb (87.1 kg), SpO2 98%. Body mass index is 38.78 kg/m.  General: Cooperative, alert, well developed, in no acute distress. HEENT: Conjunctivae and lids unremarkable. Cardiovascular: Regular rhythm.  Lungs: Normal work of breathing. Neurologic: No focal deficits.   Lab Results  Component Value Date   CREATININE 0.99 02/19/2024   BUN 17 02/19/2024   NA 142 02/19/2024   K 4.3 02/19/2024   CL 104 02/19/2024   CO2 24 02/19/2024   Lab Results  Component Value Date   ALT 33 (H) 02/19/2024   AST 21 02/19/2024   ALKPHOS 62 02/19/2024   BILITOT 0.6 02/19/2024   Lab Results  Component Value Date   HGBA1C 6.4 (H) 02/19/2024   HGBA1C 6.5 (H) 10/09/2023   HGBA1C 7.1 (H) 05/07/2023   HGBA1C 7.9 (H) 12/11/2022   HGBA1C 7.3 (H) 07/15/2022   Lab Results  Component Value Date   INSULIN  29.5 (H) 02/19/2024   INSULIN  19.1 10/09/2023   INSULIN  20.2 06/05/2023   INSULIN  13.4 08/14/2022   INSULIN  22.1 09/28/2021   Lab Results  Component Value Date   TSH 0.41 01/22/2024   Lab Results  Component Value Date   CHOL 140 02/28/2024   HDL 50 02/28/2024   LDLCALC 70 02/28/2024   TRIG 111 02/28/2024   CHOLHDL 2.8 02/28/2024   Lab Results  Component Value Date   VD25OH 54.9 02/19/2024   VD25OH 46.6 10/09/2023   VD25OH 51.0 05/07/2023   Lab Results  Component Value Date   WBC 5.3 11/12/2023   HGB 13.0 11/12/2023   HCT 40.3 11/12/2023   MCV 92.6 11/12/2023   PLT 195 11/12/2023   No results found for: IRON, TIBC, FERRITIN  Attestation Statements:   Reviewed by clinician on day of visit: allergies, medications,  problem list, medical history, surgical history, family history, social history, and previous encounter notes.  I have reviewed the above documentation for accuracy and completeness, and I agree with the above. -  Ilze Roselli d. Koryn Charlot, NP-C "

## 2024-05-09 ENCOUNTER — Encounter (HOSPITAL_COMMUNITY): Payer: Self-pay

## 2024-05-09 NOTE — Pre-Procedure Instructions (Signed)
 Surgical Instructions   Your procedure is scheduled on May 15, 2024. Report to [facility] Main Entrance A at 5:30 A.M., then check in with the Admitting office. Any questions or running late day of surgery: call [phone]  Questions prior to your surgery date: call [phone], Monday-Friday, 8am-4pm. If you experience any cold or flu symptoms such as cough, fever, chills, shortness of breath, etc. between now and your scheduled surgery, please notify us  at the above number.     Remember:  Do not eat after midnight the night before your surgery  You may drink clear liquids until 4:30 AM the morning of your surgery.   Clear liquids allowed are: Water, Non-Citrus Juices (without pulp), Carbonated Beverages, Clear Tea (no milk, honey, etc.), Black Coffee Only (NO MILK, CREAM OR POWDERED CREAMER of any kind), and Gatorade.  Patient Instructions  The night before surgery:  No food after midnight. ONLY clear liquids after midnight  The day of surgery (if you have diabetes): Drink ONE (1) 12 oz G2 given to you in your pre admission testing appointment by 4:30 AM the morning of surgery. Drink in one sitting. Do not sip.  This drink was given to you during your hospital  pre-op appointment visit.  Nothing else to drink after completing the  12 oz bottle of G2.         If you have questions, please contact your surgeons office.   Take these medicines the morning of surgery with A SIP OF WATER: busPIRone  (BUSPAR )  carvedilol  (COREG )  pantoprazole  (PROTONIX )  sertraline  (ZOLOFT )    May take these medicines IF NEEDED: albuterol  (PROVENTIL  HFA;VENTOLIN  HFA) inhaler - please bring inhaler with you morning of surgery albuterol  (VENTOLIN  HFA) inhaler - please bring inhaler with you morning of surgery EPINEPHrine  Pen fluticasone  furoate-vilanterol (BREO ELLIPTA ) inhaler hydrOXYzine  (VISTARIL )  ipratropium-albuterol  (DUONEB) inhaler loperamide  (IMODIUM )  nitroGLYCERIN   (NITROSTAT ) - if dose taken prior to surgery, please call [phone] ondansetron  (ZOFRAN -ODT)  valACYclovir  (VALTREX )    One week prior to surgery, STOP taking any Aspirin  (unless otherwise instructed by your surgeon) Aleve, Naproxen, Ibuprofen , Motrin , Advil , Goody's, BC's, all herbal medications, fish oil, and non-prescription vitamins.   WHAT DO I DO ABOUT MY DIABETES MEDICATION?   Do not take metFORMIN  (GLUCOPHAGE -XR) the morning of surgery.  STOP taking your empagliflozin  (JARDIANCE ) three days prior to surgery. Your last dose will be January 18th.      STOP taking your Semaglutide  tablets 24 hours prior to surgery. Do not take any doses after January 21st at 7:00am.   HOW TO MANAGE YOUR DIABETES BEFORE AND AFTER SURGERY  Why is it important to control my blood sugar before and after surgery? Improving blood sugar levels before and after surgery helps healing and can limit problems. A way of improving blood sugar control is eating a healthy diet by:  Eating less sugar and carbohydrates  Increasing activity/exercise  Talking with your doctor about reaching your blood sugar goals High blood sugars (greater than 180 mg/dL) can raise your risk of infections and slow your recovery, so you will need to focus on controlling your diabetes during the weeks before surgery. Make sure that the doctor who takes care of your diabetes knows about your planned surgery including the date and location.  How do I manage my blood sugar before surgery? Check your blood sugar at least 4 times a day, starting 2 days before surgery, to make sure that the level is not too high or low.  Check  your blood sugar the morning of your surgery when you wake up and every 2 hours until you get to the Short Stay unit.  If your blood sugar is less than 70 mg/dL, you will need to treat for low blood sugar: Do not take insulin . Treat a low blood sugar (less than 70 mg/dL) with  cup of clear juice (cranberry or  apple), 4 glucose tablets, OR glucose gel. Recheck blood sugar in 15 minutes after treatment (to make sure it is greater than 70 mg/dL). If your blood sugar is not greater than 70 mg/dL on recheck, call [phone] for further instructions. Report your blood sugar to the short stay nurse when you get to Short Stay.  If you are admitted to the hospital after surgery: Your blood sugar will be checked by the staff and you will probably be given insulin  after surgery (instead of oral diabetes medicines) to make sure you have good blood sugar levels. The goal for blood sugar control after surgery is 80-180 mg/dL.                      Do NOT Smoke (Tobacco/Vaping) for 24 hours prior to your procedure.  If you use a CPAP at night, you may bring your mask/headgear for your overnight stay.   You will be asked to remove any contacts, glasses, piercing's, hearing aid's, dentures/partials prior to surgery. Please bring cases for these items if needed.    Your surgeon will determine if you are to be admitted or discharged the same day.  Patients discharged the day of surgery will not be allowed to drive home, and someone needs to stay with them for 24 hours.  SURGICAL WAITING ROOM VISITATION Patients may have no more than 2 support people in the waiting area - these visitors may rotate.   Pre-op nurse will coordinate an appropriate time for 2 ADULT support persons, who may not rotate, to accompany patient in pre-op.  Children under the age of 61 must have an adult with them who is not the patient and must remain in the main waiting area with an adult.  If the patient needs to stay at the hospital during part of their recovery, the visitor guidelines for inpatient rooms apply.  Please refer to the [facility] website for the visitor guidelines for any additional information.   If you received a COVID test during your pre-op visit  it is requested that you wear a mask when out in public, stay away from  anyone that may not be feeling well and notify your surgeon if you develop symptoms. If you have been in contact with anyone that has tested positive in the last 10 days please notify you surgeon.      Pre-operative 4 CHG Bathing Instructions   You can play a key role in reducing the risk of infection after surgery. Your skin needs to be as free of germs as possible. You can reduce the number of germs on your skin by washing with CHG (chlorhexidine  gluconate) soap before surgery. CHG is an antiseptic soap that kills germs and continues to kill germs even after washing.   DO NOT use if you have an allergy to chlorhexidine /CHG or antibacterial soaps. If your skin becomes reddened or irritated, stop using the CHG and notify one of our RNs at [phone].   Please shower with the CHG soap starting 4 days before surgery using the following schedule:     Please keep in mind  the following:  DO NOT shave, including legs and underarms, starting the day of your first shower.   You may shave your face at any point before/day of surgery.  Place clean sheets on your bed the day you start using CHG soap. Use a clean washcloth (not used since being washed) for each shower. DO NOT sleep with pets once you start using the CHG.   CHG Shower Instructions:  Wash your face and private area with normal soap. If you choose to wash your hair, wash first with your normal shampoo.  After you use shampoo/soap, rinse your hair and body thoroughly to remove shampoo/soap residue.  Turn the water OFF and apply  bottle of CHG soap to a CLEAN washcloth.  Apply CHG soap ONLY FROM YOUR NECK DOWN TO YOUR TOES (washing for 3-5 minutes)  DO NOT use CHG soap on face, private areas, open wounds, or sores.  Pay special attention to the area where your surgery is being performed.  If you are having back surgery, having someone wash your back for you may be helpful. Wait 2 minutes after CHG soap is applied, then you may rinse  off the CHG soap.  Pat dry with a clean towel  Put on clean clothes/pajamas   If you choose to wear lotion, please use ONLY the CHG-compatible lotions that are listed below.  Additional instructions for the day of surgery:  If you choose, you may shower the morning of surgery with an antibacterial soap.  DO NOT APPLY any lotions, deodorants, cologne, or perfumes.   Do not bring valuables to the hospital. [facility] is not responsible for any belongings/valuables. Do not wear nail polish, gel polish, artificial nails, or any other type of covering on natural nails (fingers and toes) Do not wear jewelry or makeup Put on clean/comfortable clothes.  Please brush your teeth.  Ask your nurse before applying any prescription medications to the skin.     CHG Compatible Lotions   Aveeno Moisturizing lotion  Cetaphil Moisturizing Cream  Cetaphil Moisturizing Lotion  Clairol Herbal Essence Moisturizing Lotion, Dry Skin  Clairol Herbal Essence Moisturizing Lotion, Extra Dry Skin  Clairol Herbal Essence Moisturizing Lotion, Normal Skin  Curel Age Defying Therapeutic Moisturizing Lotion with Alpha Hydroxy  Curel Extreme Care Body Lotion  Curel Soothing Hands Moisturizing Hand Lotion  Curel Therapeutic Moisturizing Cream, Fragrance-Free  Curel Therapeutic Moisturizing Lotion, Fragrance-Free  Curel Therapeutic Moisturizing Lotion, Original Formula  Eucerin Daily Replenishing Lotion  Eucerin Dry Skin Therapy Plus Alpha Hydroxy Crme  Eucerin Dry Skin Therapy Plus Alpha Hydroxy Lotion  Eucerin Original Crme  Eucerin Original Lotion  Eucerin Plus Crme Eucerin Plus Lotion  Eucerin TriLipid Replenishing Lotion  Keri Anti-Bacterial Hand Lotion  Keri Deep Conditioning Original Lotion Dry Skin Formula Softly Scented  Keri Deep Conditioning Original Lotion, Fragrance Free Sensitive Skin Formula  Keri Lotion Fast Absorbing Fragrance Free Sensitive Skin Formula  Keri Lotion Fast Absorbing Softly  Scented Dry Skin Formula  Keri Original Lotion  Keri Skin Renewal Lotion Keri Silky Smooth Lotion  Keri Silky Smooth Sensitive Skin Lotion  Nivea Body Creamy Conditioning Oil  Nivea Body Extra Enriched Lotion  Nivea Body Original Lotion  Nivea Body Sheer Moisturizing Lotion Nivea Crme  Nivea Skin Firming Lotion  NutraDerm 30 Skin Lotion  NutraDerm Skin Lotion  NutraDerm Therapeutic Skin Cream  NutraDerm Therapeutic Skin Lotion  ProShield Protective Hand Cream  Provon moisturizing lotion  Please read over the following fact sheets that you were given.

## 2024-05-12 ENCOUNTER — Other Ambulatory Visit: Payer: Self-pay

## 2024-05-12 ENCOUNTER — Encounter (HOSPITAL_COMMUNITY)
Admission: RE | Admit: 2024-05-12 | Discharge: 2024-05-12 | Disposition: A | Source: Ambulatory Visit | Attending: Orthopedic Surgery

## 2024-05-12 ENCOUNTER — Encounter (HOSPITAL_COMMUNITY): Payer: Self-pay

## 2024-05-12 VITALS — BP 120/78 | HR 78 | Temp 97.9°F | Resp 18 | Ht <= 58 in | Wt 199.0 lb

## 2024-05-12 DIAGNOSIS — E785 Hyperlipidemia, unspecified: Secondary | ICD-10-CM | POA: Insufficient documentation

## 2024-05-12 DIAGNOSIS — J45909 Unspecified asthma, uncomplicated: Secondary | ICD-10-CM | POA: Insufficient documentation

## 2024-05-12 DIAGNOSIS — E119 Type 2 diabetes mellitus without complications: Secondary | ICD-10-CM | POA: Insufficient documentation

## 2024-05-12 DIAGNOSIS — I110 Hypertensive heart disease with heart failure: Secondary | ICD-10-CM | POA: Diagnosis not present

## 2024-05-12 DIAGNOSIS — Z8673 Personal history of transient ischemic attack (TIA), and cerebral infarction without residual deficits: Secondary | ICD-10-CM | POA: Insufficient documentation

## 2024-05-12 DIAGNOSIS — Z01812 Encounter for preprocedural laboratory examination: Secondary | ICD-10-CM | POA: Insufficient documentation

## 2024-05-12 DIAGNOSIS — M069 Rheumatoid arthritis, unspecified: Secondary | ICD-10-CM | POA: Insufficient documentation

## 2024-05-12 DIAGNOSIS — E049 Nontoxic goiter, unspecified: Secondary | ICD-10-CM | POA: Diagnosis not present

## 2024-05-12 DIAGNOSIS — I2510 Atherosclerotic heart disease of native coronary artery without angina pectoris: Secondary | ICD-10-CM | POA: Diagnosis not present

## 2024-05-12 DIAGNOSIS — K219 Gastro-esophageal reflux disease without esophagitis: Secondary | ICD-10-CM | POA: Insufficient documentation

## 2024-05-12 DIAGNOSIS — Z79899 Other long term (current) drug therapy: Secondary | ICD-10-CM | POA: Diagnosis not present

## 2024-05-12 DIAGNOSIS — Z87891 Personal history of nicotine dependence: Secondary | ICD-10-CM | POA: Insufficient documentation

## 2024-05-12 DIAGNOSIS — Z01818 Encounter for other preprocedural examination: Secondary | ICD-10-CM | POA: Diagnosis present

## 2024-05-12 DIAGNOSIS — Z86711 Personal history of pulmonary embolism: Secondary | ICD-10-CM | POA: Insufficient documentation

## 2024-05-12 DIAGNOSIS — I5032 Chronic diastolic (congestive) heart failure: Secondary | ICD-10-CM | POA: Insufficient documentation

## 2024-05-12 DIAGNOSIS — R0789 Other chest pain: Secondary | ICD-10-CM | POA: Insufficient documentation

## 2024-05-12 DIAGNOSIS — G4733 Obstructive sleep apnea (adult) (pediatric): Secondary | ICD-10-CM | POA: Insufficient documentation

## 2024-05-12 DIAGNOSIS — Z6841 Body Mass Index (BMI) 40.0 and over, adult: Secondary | ICD-10-CM | POA: Insufficient documentation

## 2024-05-12 DIAGNOSIS — E66813 Obesity, class 3: Secondary | ICD-10-CM | POA: Insufficient documentation

## 2024-05-12 HISTORY — DX: Nontoxic goiter, unspecified: E04.9

## 2024-05-12 HISTORY — DX: Cardiac arrhythmia, unspecified: I49.9

## 2024-05-12 HISTORY — DX: Atherosclerotic heart disease of native coronary artery without angina pectoris: I25.10

## 2024-05-12 LAB — CBC
HCT: 42.8 % (ref 36.0–46.0)
Hemoglobin: 14.3 g/dL (ref 12.0–15.0)
MCH: 30 pg (ref 26.0–34.0)
MCHC: 33.4 g/dL (ref 30.0–36.0)
MCV: 89.9 fL (ref 80.0–100.0)
Platelets: 233 K/uL (ref 150–400)
RBC: 4.76 MIL/uL (ref 3.87–5.11)
RDW: 13.1 % (ref 11.5–15.5)
WBC: 5.5 K/uL (ref 4.0–10.5)
nRBC: 0 % (ref 0.0–0.2)

## 2024-05-12 LAB — BASIC METABOLIC PANEL WITH GFR
Anion gap: 9 (ref 5–15)
BUN: 14 mg/dL (ref 8–23)
CO2: 29 mmol/L (ref 22–32)
Calcium: 9.8 mg/dL (ref 8.9–10.3)
Chloride: 104 mmol/L (ref 98–111)
Creatinine, Ser: 1.03 mg/dL — ABNORMAL HIGH (ref 0.44–1.00)
GFR, Estimated: >60 mL/min
Glucose, Bld: 99 mg/dL (ref 70–99)
Potassium: 4.1 mmol/L (ref 3.5–5.1)
Sodium: 142 mmol/L (ref 135–145)

## 2024-05-12 LAB — HEMOGLOBIN A1C
Hgb A1c MFr Bld: 6.4 % — ABNORMAL HIGH (ref 4.8–5.6)
Mean Plasma Glucose: 136.98 mg/dL

## 2024-05-12 LAB — SURGICAL PCR SCREEN
MRSA, PCR: NEGATIVE
Staphylococcus aureus: NEGATIVE

## 2024-05-12 LAB — GLUCOSE, CAPILLARY: Glucose-Capillary: 104 mg/dL — ABNORMAL HIGH (ref 70–99)

## 2024-05-12 NOTE — Progress Notes (Signed)
 Pt reported taking daily aspirin  81 mg. Last dose was this morning, 05/12/24. Pt states this was prescribed by doctor but she doesn't know which one. Pt instructed not to take any more doses. [name] at Dr. Andrey office and she will speak with MD and f/u with patient.

## 2024-05-12 NOTE — Progress Notes (Signed)
 PCP - Daphne Lesches NP [facility] Cardiologist - Maude Emmer   PPM/ICD - Denies   Chest x-ray - 10/23/23 EKG - 02/26/24 Stress Test - 12/27/22 ECHO - 12/21/23 Cardiac Cath - 01/10/23  Sleep Study - yes CPAP - 01/08/24  PFT - 12/03/23  Fasting Blood Sugar - 112-130 Checks Blood Sugar once a day  Last dose of GLP1 agonist-  05/12/24 GLP1 instructions: oral Semaglutide . Instructed to take last dose before 0700on 05/14/24  Blood Thinner Instructions: denies  ERAS Protcol - Clear liquids until 04:30 PRE-SURGERY - G2  COVID TEST- NA   Anesthesia review: yes,  HTN, CHF, OSA, CAD, DM, TIA. Pt seeing podiatrist on Wednesday for recent foot infection treated with antifungal. No open wound but itching and tight.  Patient denies shortness of breath, fever, cough and chest pain at PAT appointment. Denies respiratory symptoms in the last two weeks.    All instructions explained to the patient, with a verbal understanding of the material. Patient agrees to go over the instructions while at home for a better understanding. The opportunity to ask questions was provided.

## 2024-05-13 NOTE — Anesthesia Preprocedure Evaluation (Addendum)
 "                                  Anesthesia Evaluation  Patient identified by MRN, date of birth, ID band  Reviewed: Allergy & Precautions, NPO status , Patient's Chart, lab work & pertinent test results  History of Anesthesia Complications Negative for: history of anesthetic complications  Airway Mallampati: II  TM Distance: >3 FB Neck ROM: Limited    Dental  (+) Teeth Intact, Dental Advisory Given   Pulmonary asthma , sleep apnea , former smoker, PE (s/p Coumadin x 6 months)   breath sounds clear to auscultation       Cardiovascular hypertension, Pt. on medications + CAD and + DVT   Rhythm:Regular Rate:Normal  EKG 02/26/2024: NSR.  Rate 71.  Septal infarct cited on or before 11/13/2023.  No significant change.   CTA chest 11/12/2023: IMPRESSION: 1. No evidence of thoracic or abdominal aortic aneurysm or dissection. 2. No evidence of pulmonary embolism. 3. No acute findings in the chest, abdomen, or pelvis.   TTE 12/21/2023: 1. Left ventricular ejection fraction, by estimation, is 55 to 60%. The  left ventricle has normal function. The left ventricle has no regional  wall motion abnormalities. Left ventricular diastolic parameters were  normal. The average left ventricular  global longitudinal strain is -12.2 %. The global longitudinal strain is  abnormal.   2. Right ventricular systolic function is normal. The right ventricular  size is normal.   3. The mitral valve is normal in structure. Trivial mitral valve  regurgitation. No evidence of mitral stenosis.   4. The aortic valve is normal in structure. Aortic valve regurgitation is  not visualized. No aortic stenosis is present.   5. The inferior vena cava is normal in size with greater than 50%  respiratory variability, suggesting right atrial pressure of 3 mmHg.      Cath 01/10/2023:   Mid LAD lesion is 25% stenosed.    Neuro/Psych  PSYCHIATRIC DISORDERS  Depression    TIA   GI/Hepatic PUD,GERD   Medicated and Controlled,,  Endo/Other  diabetes  Class 3 obesity Hx of Mediastinal Mass (?thymoma)   Ozempic  last dose < 1 week ago   Renal/GU Renal InsufficiencyRenal disease (Baseline Cr ~1.0, GFR 50s)     Musculoskeletal  (+) Arthritis , Rheumatoid disorders,    Abdominal   Peds  Hematology  Hgb 14.3, Plts 233K (05/13/23)   Anesthesia Other Findings   Reproductive/Obstetrics  Hx of Breast Cancer s/p Lumpectomy + Seed                               Anesthesia Physical Anesthesia Plan  ASA: 3  Anesthesia Plan: General   Post-op Pain Management:    Induction: Intravenous and Rapid sequence  PONV Risk Score and Plan: 3 and Ondansetron , Dexamethasone  and Treatment may vary due to age or medical condition  Airway Management Planned: Oral ETT and Video Laryngoscope Planned  Additional Equipment: None  Intra-op Plan:   Post-operative Plan: Extubation in OR  Informed Consent: I have reviewed the patients History and Physical, chart, labs and discussed the procedure including the risks, benefits and alternatives for the proposed anesthesia with the patient or authorized representative who has indicated his/her understanding and acceptance.     Dental advisory given  Plan Discussed with: CRNA  Anesthesia Plan Comments: (  )  Anesthesia Quick Evaluation  "

## 2024-05-13 NOTE — Progress Notes (Addendum)
 Anesthesia Chart Review:  64 year old female follows with cardiology for history of HFpEF, OSA on CPAP, remote PE no longer on oral anticoagulation, TIA, HTN, HLD, atypical chest pain.  Abnormal POET 12/2022 that led to cath on 01/11/2023 showing minimal CAD.  Echo 11/2023 with LVEF 55 to 60%, normal wall motion, normal RV, no significant valvular abnormalities.  Last seen in follow-up by Miriam Shams, NP on 02/26/2024, no changes to management, 16-month follow-up recommended.  Other pertinent history includes remote former smoker, asthma, class III obesity BMI 41, non-insulin -dependent DM2, GERD on PPI, rheumatoid arthritis, thyroid  goiter (right lobe 6.5 x 2.4 by 3.0 cm; left lobe 4.9 x 1.7 x 1.9 cm by ultrasound 11/2022)  Patient reports last dose oral semaglutide  will be on 05/14/2024.  Preop labs reviewed, unremarkable.  DM2 well-controlled with A1c 6.4.  EKG 02/26/2024: NSR.  Rate 71.  Septal infarct cited on or before 11/13/2023.  No significant change.  CTA chest 11/12/2023: IMPRESSION: 1. No evidence of thoracic or abdominal aortic aneurysm or dissection. 2. No evidence of pulmonary embolism. 3. No acute findings in the chest, abdomen, or pelvis.  TTE 12/21/2023: 1. Left ventricular ejection fraction, by estimation, is 55 to 60%. The  left ventricle has normal function. The left ventricle has no regional  wall motion abnormalities. Left ventricular diastolic parameters were  normal. The average left ventricular  global longitudinal strain is -12.2 %. The global longitudinal strain is  abnormal.   2. Right ventricular systolic function is normal. The right ventricular  size is normal.   3. The mitral valve is normal in structure. Trivial mitral valve  regurgitation. No evidence of mitral stenosis.   4. The aortic valve is normal in structure. Aortic valve regurgitation is  not visualized. No aortic stenosis is present.   5. The inferior vena cava is normal in size with greater than 50%   respiratory variability, suggesting right atrial pressure of 3 mmHg.    Cath 01/10/2023:   Mid LAD lesion is 25% stenosed.   1.  Minimal obstructive coronary artery disease.  The right coronary artery is small and nondominant.  Selective coronary angiography of the right coronary artery could not be performed however its angiographic appearance is similar to that on the coronary angiography performed in 2016. 2.  Cardiac output of 6.5 L/min and cardiac index of 3.5 L/min/m with the following hemodynamics:             Right atrial pressure mean of 6 mmHg             Right ventricular pressure 46/-1 1 with an end-diastolic pressure of 9 mmHg             Wedge pressure mean of 12 mmHg             Pulmonary pressure 45/14 with a mean pressure of 28 mmHg             PVR of 2.5 Woods units             Pulmonary artery pulsatility index of 5.2 3.  LVEDP of 14 mmHg . Recommendation: Relatively unchanged nonobstructive coronary artery disease versus coronary angiography performed in 2016.; pursue medical therapy.     Lyn[place] Geofm [facility] Short Stay Center/Anesthesiology Phone [phone] 05/13/2024 2:35 PM

## 2024-05-14 ENCOUNTER — Telehealth: Payer: Self-pay | Admitting: Neurology

## 2024-05-14 NOTE — Telephone Encounter (Signed)
 Patient reschedule appointment due to having surgery

## 2024-05-14 NOTE — Telephone Encounter (Signed)
 No more lamisil . Tell pharmacy to stop bothering us 

## 2024-05-15 ENCOUNTER — Ambulatory Visit (HOSPITAL_COMMUNITY)

## 2024-05-15 ENCOUNTER — Observation Stay (HOSPITAL_COMMUNITY)
Admission: RE | Admit: 2024-05-15 | Discharge: 2024-05-20 | Disposition: A | Discharge status: Skilled Nursing Facility | Attending: Orthopedic Surgery | Admitting: Orthopedic Surgery

## 2024-05-15 ENCOUNTER — Encounter (HOSPITAL_COMMUNITY)
Admission: RE | Disposition: A | Discharge status: Skilled Nursing Facility | Payer: Self-pay | Source: Home / Self Care | Attending: Orthopedic Surgery

## 2024-05-15 ENCOUNTER — Encounter (HOSPITAL_COMMUNITY): Admitting: Physician Assistant

## 2024-05-15 ENCOUNTER — Other Ambulatory Visit: Payer: Self-pay

## 2024-05-15 ENCOUNTER — Encounter (HOSPITAL_COMMUNITY): Payer: Self-pay | Admitting: Orthopedic Surgery

## 2024-05-15 DIAGNOSIS — Z85830 Personal history of malignant neoplasm of bone: Secondary | ICD-10-CM | POA: Diagnosis not present

## 2024-05-15 DIAGNOSIS — I110 Hypertensive heart disease with heart failure: Secondary | ICD-10-CM | POA: Insufficient documentation

## 2024-05-15 DIAGNOSIS — J45909 Unspecified asthma, uncomplicated: Secondary | ICD-10-CM | POA: Insufficient documentation

## 2024-05-15 DIAGNOSIS — M5416 Radiculopathy, lumbar region: Secondary | ICD-10-CM | POA: Insufficient documentation

## 2024-05-15 DIAGNOSIS — M48061 Spinal stenosis, lumbar region without neurogenic claudication: Principal | ICD-10-CM | POA: Insufficient documentation

## 2024-05-15 DIAGNOSIS — E119 Type 2 diabetes mellitus without complications: Secondary | ICD-10-CM | POA: Diagnosis not present

## 2024-05-15 DIAGNOSIS — I509 Heart failure, unspecified: Secondary | ICD-10-CM | POA: Diagnosis not present

## 2024-05-15 DIAGNOSIS — I25119 Atherosclerotic heart disease of native coronary artery with unspecified angina pectoris: Secondary | ICD-10-CM | POA: Insufficient documentation

## 2024-05-15 DIAGNOSIS — I2510 Atherosclerotic heart disease of native coronary artery without angina pectoris: Secondary | ICD-10-CM | POA: Diagnosis not present

## 2024-05-15 DIAGNOSIS — Z8673 Personal history of transient ischemic attack (TIA), and cerebral infarction without residual deficits: Secondary | ICD-10-CM | POA: Diagnosis not present

## 2024-05-15 DIAGNOSIS — Z91040 Latex allergy status: Secondary | ICD-10-CM | POA: Diagnosis not present

## 2024-05-15 DIAGNOSIS — Z87891 Personal history of nicotine dependence: Secondary | ICD-10-CM

## 2024-05-15 DIAGNOSIS — Z96652 Presence of left artificial knee joint: Secondary | ICD-10-CM | POA: Diagnosis not present

## 2024-05-15 DIAGNOSIS — Z96642 Presence of left artificial hip joint: Secondary | ICD-10-CM | POA: Diagnosis not present

## 2024-05-15 DIAGNOSIS — Z7984 Long term (current) use of oral hypoglycemic drugs: Secondary | ICD-10-CM | POA: Insufficient documentation

## 2024-05-15 DIAGNOSIS — Z01818 Encounter for other preprocedural examination: Secondary | ICD-10-CM

## 2024-05-15 DIAGNOSIS — M4316 Spondylolisthesis, lumbar region: Secondary | ICD-10-CM | POA: Diagnosis not present

## 2024-05-15 DIAGNOSIS — M79606 Pain in leg, unspecified: Secondary | ICD-10-CM | POA: Diagnosis present

## 2024-05-15 DIAGNOSIS — M5410 Radiculopathy, site unspecified: Principal | ICD-10-CM | POA: Diagnosis present

## 2024-05-15 DIAGNOSIS — Z79899 Other long term (current) drug therapy: Secondary | ICD-10-CM | POA: Diagnosis not present

## 2024-05-15 LAB — TYPE AND SCREEN
ABO/RH(D): A POS
Antibody Screen: NEGATIVE

## 2024-05-15 LAB — GLUCOSE, CAPILLARY
Glucose-Capillary: 122 mg/dL — ABNORMAL HIGH (ref 70–99)
Glucose-Capillary: 117 mg/dL — ABNORMAL HIGH (ref 70–99)

## 2024-05-15 MED ORDER — PROPOFOL 10 MG/ML IV BOLUS
INTRAVENOUS | Status: AC
  Filled 2024-05-15: qty 20

## 2024-05-15 MED ORDER — ACETAMINOPHEN 650 MG RE SUPP: 650.0000 mg | RECTAL | Status: DC | PRN

## 2024-05-15 MED ORDER — THROMBIN 20000 UNITS EX KIT
PACK | CUTANEOUS | Status: DC | PRN
  Administered 2024-05-15: 20 mL via TOPICAL

## 2024-05-15 MED ORDER — POTASSIUM CHLORIDE IN NACL 20-0.9 MEQ/L-% IV SOLN
INTRAVENOUS | Status: DC
  Filled 2024-05-15: qty 1000

## 2024-05-15 MED ORDER — ATORVASTATIN CALCIUM 40 MG PO TABS
40.0000 mg | ORAL_TABLET | Freq: Every day | ORAL | Status: DC
  Administered 2024-05-15 – 2024-05-19 (×5): 40 mg via ORAL
  Filled 2024-05-15 (×5): qty 1

## 2024-05-15 MED ORDER — MORPHINE SULFATE (PF) 2 MG/ML IV SOLN
2.0000 mg | INTRAVENOUS | Status: DC | PRN
  Administered 2024-05-15: 2 mg via INTRAVENOUS
  Filled 2024-05-15: qty 1

## 2024-05-15 MED ORDER — MENTHOL 3 MG MT LOZG: 1.0000 | LOZENGE | OROMUCOSAL | Status: DC | PRN

## 2024-05-15 MED ORDER — HYDROMORPHONE HCL 1 MG/ML IJ SOLN
INTRAMUSCULAR | Status: AC
  Filled 2024-05-15: qty 1

## 2024-05-15 MED ORDER — ACETAMINOPHEN 325 MG PO TABS: 650.0000 mg | ORAL_TABLET | ORAL | Status: DC | PRN

## 2024-05-15 MED ORDER — ONDANSETRON HCL 4 MG/2ML IJ SOLN: 4.0000 mg | Freq: Once | INTRAMUSCULAR | Status: DC | PRN

## 2024-05-15 MED ORDER — LOPERAMIDE HCL 2 MG PO CAPS: 2.0000 mg | ORAL_CAPSULE | Freq: Four times a day (QID) | ORAL | Status: DC | PRN

## 2024-05-15 MED ORDER — BUPIVACAINE-EPINEPHRINE (PF) 0.25% -1:200000 IJ SOLN
INTRAMUSCULAR | Status: AC
  Filled 2024-05-15: qty 30

## 2024-05-15 MED ORDER — CEFAZOLIN SODIUM-DEXTROSE 2-4 GM/100ML-% IV SOLN
2.0000 g | INTRAVENOUS | Status: AC
  Administered 2024-05-15: 2 g via INTRAVENOUS
  Filled 2024-05-15: qty 100

## 2024-05-15 MED ORDER — CHLORHEXIDINE GLUCONATE 0.12 % MT SOLN
15.0000 mL | Freq: Once | OROMUCOSAL | Status: AC
  Administered 2024-05-15: 15 mL via OROMUCOSAL
  Filled 2024-05-15: qty 15

## 2024-05-15 MED ORDER — VALACYCLOVIR HCL 500 MG PO TABS: 500.0000 mg | ORAL_TABLET | Freq: Every day | ORAL | Status: DC | PRN

## 2024-05-15 MED ORDER — METHOCARBAMOL 1000 MG/10ML IJ SOLN
500.0000 mg | Freq: Four times a day (QID) | INTRAMUSCULAR | Status: DC | PRN
  Filled 2024-05-15: qty 10

## 2024-05-15 MED ORDER — FENTANYL CITRATE (PF) 250 MCG/5ML IJ SOLN
INTRAMUSCULAR | Status: DC | PRN
  Administered 2024-05-15: 25 ug via INTRAVENOUS
  Administered 2024-05-15 (×3): 50 ug via INTRAVENOUS
  Administered 2024-05-15 (×3): 25 ug via INTRAVENOUS

## 2024-05-15 MED ORDER — DOCUSATE SODIUM 100 MG PO CAPS
100.0000 mg | ORAL_CAPSULE | Freq: Two times a day (BID) | ORAL | Status: DC
  Administered 2024-05-15 – 2024-05-20 (×10): 100 mg via ORAL
  Filled 2024-05-15 (×11): qty 1

## 2024-05-15 MED ORDER — ROCURONIUM BROMIDE 10 MG/ML (PF) SYRINGE
PREFILLED_SYRINGE | INTRAVENOUS | Status: AC
  Filled 2024-05-15: qty 10

## 2024-05-15 MED ORDER — FLEET ENEMA RE ENEM: 1.0000 | ENEMA | Freq: Once | RECTAL | Status: DC | PRN

## 2024-05-15 MED ORDER — SENNOSIDES-DOCUSATE SODIUM 8.6-50 MG PO TABS: 1.0000 | ORAL_TABLET | Freq: Every evening | ORAL | Status: DC | PRN

## 2024-05-15 MED ORDER — HYDROMORPHONE HCL 1 MG/ML IJ SOLN
0.2500 mg | INTRAMUSCULAR | Status: DC | PRN
  Administered 2024-05-15 (×4): 0.5 mg via INTRAVENOUS

## 2024-05-15 MED ORDER — VITAMIN D (ERGOCALCIFEROL) 1.25 MG (50000 UNIT) PO CAPS
50000.0000 [IU] | ORAL_CAPSULE | ORAL | Status: DC
  Filled 2024-05-15: qty 1

## 2024-05-15 MED ORDER — SODIUM CHLORIDE 0.9% FLUSH
3.0000 mL | Freq: Two times a day (BID) | INTRAVENOUS | Status: DC
  Administered 2024-05-15 – 2024-05-20 (×10): 3 mL via INTRAVENOUS

## 2024-05-15 MED ORDER — PANTOPRAZOLE SODIUM 40 MG PO TBEC
40.0000 mg | DELAYED_RELEASE_TABLET | Freq: Every day | ORAL | Status: DC
  Administered 2024-05-16 – 2024-05-20 (×5): 40 mg via ORAL
  Filled 2024-05-15 (×5): qty 1

## 2024-05-15 MED ORDER — BUPIVACAINE-EPINEPHRINE 0.25% -1:200000 IJ SOLN
INTRAMUSCULAR | Status: DC | PRN
  Administered 2024-05-15: 5 mL
  Administered 2024-05-15: 20 mL

## 2024-05-15 MED ORDER — 0.9 % SODIUM CHLORIDE (POUR BTL) OPTIME
TOPICAL | Status: DC | PRN
  Administered 2024-05-15: 3000 mL

## 2024-05-15 MED ORDER — FLUTICASONE FUROATE-VILANTEROL 100-25 MCG/ACT IN AEPB: 1.0000 | INHALATION_SPRAY | Freq: Every day | RESPIRATORY_TRACT | Status: DC | PRN

## 2024-05-15 MED ORDER — PHENOL 1.4 % MT LIQD: 1.0000 | OROMUCOSAL | Status: DC | PRN

## 2024-05-15 MED ORDER — METHYLENE BLUE 20 MG/2ML IV SOSY
PREFILLED_SYRINGE | INTRAVENOUS | Status: AC
  Filled 2024-05-15: qty 2

## 2024-05-15 MED ORDER — FUROSEMIDE 40 MG PO TABS: 40.0000 mg | ORAL_TABLET | Freq: Every day | ORAL | Status: DC | PRN

## 2024-05-15 MED ORDER — OXYCODONE-ACETAMINOPHEN 5-325 MG PO TABS
1.0000 | ORAL_TABLET | ORAL | Status: DC | PRN
  Administered 2024-05-15 – 2024-05-20 (×16): 2 via ORAL
  Filled 2024-05-15 (×17): qty 2

## 2024-05-15 MED ORDER — SERTRALINE HCL 25 MG PO TABS
25.0000 mg | ORAL_TABLET | Freq: Every day | ORAL | Status: DC
  Administered 2024-05-16 – 2024-05-20 (×5): 25 mg via ORAL
  Filled 2024-05-15 (×5): qty 1

## 2024-05-15 MED ORDER — THROMBIN 20000 UNITS EX SOLR
CUTANEOUS | Status: AC
  Filled 2024-05-15: qty 20000

## 2024-05-15 MED ORDER — POTASSIUM CHLORIDE CRYS ER 10 MEQ PO TBCR
10.0000 meq | EXTENDED_RELEASE_TABLET | Freq: Two times a day (BID) | ORAL | Status: DC
  Administered 2024-05-15 – 2024-05-20 (×10): 10 meq via ORAL
  Filled 2024-05-15 (×11): qty 1

## 2024-05-15 MED ORDER — OXYCODONE HCL 5 MG/5ML PO SOLN: 5.0000 mg | Freq: Once | ORAL | Status: DC | PRN

## 2024-05-15 MED ORDER — CARVEDILOL 3.125 MG PO TABS
3.1250 mg | ORAL_TABLET | Freq: Two times a day (BID) | ORAL | Status: DC
  Administered 2024-05-15 – 2024-05-20 (×10): 3.125 mg via ORAL
  Filled 2024-05-15 (×10): qty 1

## 2024-05-15 MED ORDER — IPRATROPIUM-ALBUTEROL 0.5-2.5 (3) MG/3ML IN SOLN: 3.0000 mL | Freq: Four times a day (QID) | RESPIRATORY_TRACT | Status: DC | PRN

## 2024-05-15 MED ORDER — MIDAZOLAM HCL 2 MG/2ML IJ SOLN
INTRAMUSCULAR | Status: AC
  Filled 2024-05-15: qty 2

## 2024-05-15 MED ORDER — INSULIN ASPART 100 UNIT/ML IJ SOLN: 0.0000 [IU] | INTRAMUSCULAR | Status: DC | PRN

## 2024-05-15 MED ORDER — SODIUM CHLORIDE 0.9% FLUSH: 3.0000 mL | INTRAVENOUS | Status: DC | PRN

## 2024-05-15 MED ORDER — PHENYLEPHRINE HCL-NACL 20-0.9 MG/250ML-% IV SOLN
INTRAVENOUS | Status: DC | PRN
  Administered 2024-05-15: 35 ug/min via INTRAVENOUS

## 2024-05-15 MED ORDER — METFORMIN HCL ER 500 MG PO TB24
500.0000 mg | ORAL_TABLET | Freq: Every day | ORAL | Status: DC
  Administered 2024-05-16 – 2024-05-20 (×5): 500 mg via ORAL
  Filled 2024-05-15 (×7): qty 1

## 2024-05-15 MED ORDER — PROPOFOL 10 MG/ML IV BOLUS
INTRAVENOUS | Status: DC | PRN
  Administered 2024-05-15: 100 mg via INTRAVENOUS
  Administered 2024-05-15: 60 mg via INTRAVENOUS
  Administered 2024-05-15: 40 mg via INTRAVENOUS

## 2024-05-15 MED ORDER — LOSARTAN POTASSIUM 25 MG PO TABS
25.0000 mg | ORAL_TABLET | Freq: Every day | ORAL | Status: DC
  Administered 2024-05-16 – 2024-05-20 (×5): 25 mg via ORAL
  Filled 2024-05-15 (×6): qty 1

## 2024-05-15 MED ORDER — METHOCARBAMOL 500 MG PO TABS
500.0000 mg | ORAL_TABLET | Freq: Four times a day (QID) | ORAL | Status: DC | PRN
  Administered 2024-05-16: 500 mg via ORAL
  Administered 2024-05-16 – 2024-05-17 (×4): 1000 mg via ORAL
  Administered 2024-05-18: 500 mg via ORAL
  Administered 2024-05-18 – 2024-05-19 (×2): 1000 mg via ORAL
  Administered 2024-05-19 – 2024-05-20 (×4): 500 mg via ORAL
  Filled 2024-05-15 (×5): qty 2
  Filled 2024-05-15: qty 1
  Filled 2024-05-15 (×3): qty 2
  Filled 2024-05-15: qty 1
  Filled 2024-05-15: qty 2
  Filled 2024-05-15 (×3): qty 1
  Filled 2024-05-15: qty 2
  Filled 2024-05-15: qty 1

## 2024-05-15 MED ORDER — SUGAMMADEX SODIUM 200 MG/2ML IV SOLN
INTRAVENOUS | Status: DC | PRN
  Administered 2024-05-15: 200 mg via INTRAVENOUS

## 2024-05-15 MED ORDER — LACTATED RINGERS IV SOLN: INTRAVENOUS | Status: DC

## 2024-05-15 MED ORDER — METHOCARBAMOL 1000 MG/10ML IJ SOLN
INTRAMUSCULAR | Status: AC
  Filled 2024-05-15: qty 10

## 2024-05-15 MED ORDER — BISACODYL 5 MG PO TBEC: 5.0000 mg | DELAYED_RELEASE_TABLET | Freq: Every day | ORAL | Status: DC | PRN

## 2024-05-15 MED ORDER — SCOPOLAMINE 1 MG/3DAYS TD PT72
MEDICATED_PATCH | TRANSDERMAL | Status: DC | PRN
  Administered 2024-05-15: 1 via TRANSDERMAL

## 2024-05-15 MED ORDER — LIDOCAINE 2% (20 MG/ML) 5 ML SYRINGE
INTRAMUSCULAR | Status: DC | PRN
  Administered 2024-05-15: 100 mg via INTRAVENOUS

## 2024-05-15 MED ORDER — HYDROXYZINE PAMOATE 25 MG PO CAPS
25.0000 mg | ORAL_CAPSULE | Freq: Three times a day (TID) | ORAL | Status: DC | PRN
  Filled 2024-05-15: qty 1

## 2024-05-15 MED ORDER — AMISULPRIDE (ANTIEMETIC) 5 MG/2ML IV SOLN
INTRAVENOUS | Status: AC
  Filled 2024-05-15: qty 2

## 2024-05-15 MED ORDER — SODIUM CHLORIDE 0.9 % IV SOLN
250.0000 mL | INTRAVENOUS | Status: AC
  Administered 2024-05-15: 250 mL via INTRAVENOUS

## 2024-05-15 MED ORDER — OXYCODONE HCL 5 MG PO TABS: 5.0000 mg | ORAL_TABLET | Freq: Once | ORAL | Status: DC | PRN

## 2024-05-15 MED ORDER — BUPIVACAINE LIPOSOME 1.3 % IJ SUSP
INTRAMUSCULAR | Status: AC
  Filled 2024-05-15: qty 20

## 2024-05-15 MED ORDER — ONDANSETRON HCL 4 MG/2ML IJ SOLN
INTRAMUSCULAR | Status: DC | PRN
  Administered 2024-05-15: 4 mg via INTRAVENOUS

## 2024-05-15 MED ORDER — SUCCINYLCHOLINE CHLORIDE 200 MG/10ML IV SOSY
PREFILLED_SYRINGE | INTRAVENOUS | Status: DC | PRN
  Administered 2024-05-15: 100 mg via INTRAVENOUS

## 2024-05-15 MED ORDER — BUSPIRONE HCL 10 MG PO TABS
30.0000 mg | ORAL_TABLET | Freq: Two times a day (BID) | ORAL | Status: DC
  Administered 2024-05-15 – 2024-05-20 (×10): 30 mg via ORAL
  Filled 2024-05-15 (×10): qty 3

## 2024-05-15 MED ORDER — SUCCINYLCHOLINE CHLORIDE 200 MG/10ML IV SOSY
PREFILLED_SYRINGE | INTRAVENOUS | Status: AC
  Filled 2024-05-15: qty 10

## 2024-05-15 MED ORDER — NYSTATIN 100000 UNIT/GM EX POWD: 1.0000 | Freq: Three times a day (TID) | CUTANEOUS | Status: DC | PRN

## 2024-05-15 MED ORDER — FENTANYL CITRATE (PF) 250 MCG/5ML IJ SOLN
INTRAMUSCULAR | Status: AC
  Filled 2024-05-15: qty 5

## 2024-05-15 MED ORDER — LIDOCAINE 2% (20 MG/ML) 5 ML SYRINGE
INTRAMUSCULAR | Status: AC
  Filled 2024-05-15: qty 5

## 2024-05-15 MED ORDER — ALUM & MAG HYDROXIDE-SIMETH 200-200-20 MG/5ML PO SUSP
30.0000 mL | Freq: Four times a day (QID) | ORAL | Status: DC | PRN
  Administered 2024-05-20: 30 mL via ORAL
  Filled 2024-05-15 (×2): qty 30

## 2024-05-15 MED ORDER — SEMAGLUTIDE 14 MG PO TABS: 14.0000 mg | ORAL_TABLET | Freq: Every day | ORAL | Status: DC

## 2024-05-15 MED ORDER — ORAL CARE MOUTH RINSE: 15.0000 mL | Freq: Once | OROMUCOSAL | Status: AC

## 2024-05-15 MED ORDER — KETAMINE HCL 10 MG/ML IJ SOLN
INTRAMUSCULAR | Status: DC | PRN
  Administered 2024-05-15: 20 mg via INTRAVENOUS
  Administered 2024-05-15: 10 mg via INTRAVENOUS

## 2024-05-15 MED ORDER — SPIRONOLACTONE 25 MG PO TABS
25.0000 mg | ORAL_TABLET | Freq: Every day | ORAL | Status: DC
  Administered 2024-05-16 – 2024-05-20 (×5): 25 mg via ORAL
  Filled 2024-05-15 (×5): qty 1

## 2024-05-15 MED ORDER — METHOCARBAMOL 1000 MG/10ML IJ SOLN
500.0000 mg | Freq: Once | INTRAMUSCULAR | Status: AC
  Administered 2024-05-15: 500 mg via INTRAVENOUS

## 2024-05-15 MED ORDER — MIDAZOLAM HCL (PF) 2 MG/2ML IJ SOLN
INTRAMUSCULAR | Status: DC | PRN
  Administered 2024-05-15: 2 mg via INTRAVENOUS

## 2024-05-15 MED ORDER — ACETAMINOPHEN 10 MG/ML IV SOLN
1000.0000 mg | Freq: Once | INTRAVENOUS | Status: DC | PRN
  Administered 2024-05-15: 1000 mg via INTRAVENOUS

## 2024-05-15 MED ORDER — KETAMINE HCL 50 MG/5ML IJ SOSY
PREFILLED_SYRINGE | INTRAMUSCULAR | Status: AC
  Filled 2024-05-15: qty 5

## 2024-05-15 MED ORDER — NITROGLYCERIN 0.3 MG SL SUBL: 0.3000 mg | SUBLINGUAL_TABLET | SUBLINGUAL | Status: DC | PRN

## 2024-05-15 MED ORDER — ROCURONIUM BROMIDE 10 MG/ML (PF) SYRINGE
PREFILLED_SYRINGE | INTRAVENOUS | Status: DC | PRN
  Administered 2024-05-15: 50 mg via INTRAVENOUS

## 2024-05-15 MED ORDER — EPINEPHRINE 0.3 MG/0.3ML IJ SOAJ: 0.3000 mg | INTRAMUSCULAR | Status: DC | PRN

## 2024-05-15 MED ORDER — ALBUTEROL SULFATE (2.5 MG/3ML) 0.083% IN NEBU: 2.5000 mg | INHALATION_SOLUTION | Freq: Four times a day (QID) | RESPIRATORY_TRACT | Status: DC | PRN

## 2024-05-15 MED ORDER — CEFAZOLIN SODIUM-DEXTROSE 2-4 GM/100ML-% IV SOLN
2.0000 g | Freq: Three times a day (TID) | INTRAVENOUS | Status: AC
  Administered 2024-05-15: 2 g via INTRAVENOUS
  Filled 2024-05-15: qty 100

## 2024-05-15 MED ORDER — AMISULPRIDE (ANTIEMETIC) 5 MG/2ML IV SOLN
10.0000 mg | Freq: Once | INTRAVENOUS | Status: AC | PRN
  Administered 2024-05-15: 10 mg via INTRAVENOUS

## 2024-05-15 MED ORDER — PHENYLEPHRINE 80 MCG/ML (10ML) SYRINGE FOR IV PUSH (FOR BLOOD PRESSURE SUPPORT)
PREFILLED_SYRINGE | INTRAVENOUS | Status: DC | PRN
  Administered 2024-05-15 (×2): 80 ug via INTRAVENOUS

## 2024-05-15 MED ORDER — EMPAGLIFLOZIN 10 MG PO TABS
10.0000 mg | ORAL_TABLET | Freq: Every day | ORAL | Status: DC
  Administered 2024-05-16 – 2024-05-20 (×5): 10 mg via ORAL
  Filled 2024-05-15 (×6): qty 1

## 2024-05-15 MED ORDER — ZOLPIDEM TARTRATE 5 MG PO TABS
5.0000 mg | ORAL_TABLET | Freq: Every evening | ORAL | Status: DC | PRN
  Administered 2024-05-17: 5 mg via ORAL
  Filled 2024-05-15: qty 1

## 2024-05-15 MED ORDER — ONDANSETRON 4 MG PO TBDP
4.0000 mg | ORAL_TABLET | Freq: Three times a day (TID) | ORAL | Status: DC | PRN
  Administered 2024-05-20: 4 mg via ORAL
  Filled 2024-05-15 (×2): qty 1

## 2024-05-15 MED ORDER — ONDANSETRON HCL 4 MG/2ML IJ SOLN
INTRAMUSCULAR | Status: AC
  Filled 2024-05-15: qty 2

## 2024-05-15 MED ORDER — POVIDONE-IODINE 7.5 % EX SOLN
Freq: Once | CUTANEOUS | Status: DC
  Filled 2024-05-15: qty 118

## 2024-05-15 NOTE — Anesthesia Procedure Notes (Signed)
 Procedure Name: Intubation Date/Time: 05/15/2024 7:56 AM  Performed by: Dianne Burnard RAMAN, CRNAPre-anesthesia Checklist: Patient identified, Emergency Drugs available, Suction available and Patient being monitored Patient Re-evaluated:Patient Re-evaluated prior to induction Oxygen Delivery Method: Circle system utilized Preoxygenation: Pre-oxygenation with 100% oxygen Induction Type: IV induction Ventilation: Mask ventilation without difficulty Laryngoscope Size: McGrath and 3 Grade View: Grade I Tube type: Oral Tube size: 7.0 mm Number of attempts: 1 Airway Equipment and Method: Stylet and Oral airway Placement Confirmation: ETT inserted through vocal cords under direct vision, positive ETCO2 and breath sounds checked- equal and bilateral Secured at: 22 cm Tube secured with: Tape Dental Injury: Teeth and Oropharynx as per pre-operative assessment

## 2024-05-15 NOTE — H&P (Signed)
 "    PREOPERATIVE H&P  Chief Complaint: Bilateral leg pain  HPI: [name] is a 64 y.o. female who presents with ongoing pain in the bilateral legs  MRI reveals severe stenosis and instability at L4/5  Patient has failed multiple forms of conservative care and continues to have pain (see office notes for additional details regarding the patient's full course of treatment)  Past Medical History:  Diagnosis Date   Allergy    Anginal pain    a. NL cath in 2008;  b. Myoview  03/2011: dec uptake along mid anterior wall on stress imaging -> ? attenuation vs. ischemia, EF 65%;  c. Echo 04/2011: EF 55-60%, no RWMA, Gr 2 dd   Anxiety    Arthritis    Asthma    Back pain    Bone cancer (HCC)    pt states she was told this but it didn't show up on further testing   Cancer (HCC)    Chest pain    CHF (congestive heart failure) (HCC)    Clotting disorder    Constipation    Coronary artery disease    Depression    Diabetes mellitus    Drug use    Dyspnea    Dysrhythmia    Frequent urination    GERD (gastroesophageal reflux disease)    Glaucoma    Goiter    thyroid    History of stomach ulcers    HLD (hyperlipidemia)    Hypertension    IBS (irritable bowel syndrome)    Joint pain    Joint pain    Lactose intolerance    Leg edema    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 2008 -> coumadin x 6 mos.   Rheumatoid arthritis (HCC)    Sleep apnea    on CPAP 02/2018   SOB (shortness of breath)    Thyroid  disease    TIA (transient ischemic attack)    Urinary urgency    Vitamin D  deficiency    Past Surgical History:  Procedure Laterality Date   ABDOMINAL HYSTERECTOMY  2005   APPENDECTOMY     BREAST LUMPECTOMY WITH RADIOACTIVE SEED AND SENTINEL LYMPH NODE BIOPSY Right 11/16/2021   Procedure: RIGHT BREAST RADIOACTIVE  SEED LOCALIZED LUMPECTOMY WITH RIGHT AXILLARY SENTINEL LYMPH NODE BIOPSY;  Surgeon: Belinda Cough, MD;  Location: [facility];  Service: General;  Laterality: Right;  LMA & PEC BLOCK   CARDIAC CATHETERIZATION     Normal   CARDIAC CATHETERIZATION N/A 09/30/2014   Procedure: Left Heart Cath and Coronary Angiography;  Surgeon: Ozell Fell, MD;  Location: [facility] INVASIVE CV LAB;  Service: Cardiovascular;  Laterality: N/A;   LAPAROSCOPIC APPENDECTOMY N/A 06/03/2012   Procedure: APPENDECTOMY LAPAROSCOPIC;  Surgeon: Jina Nephew, MD;  Location: [facility] OR;  Service: General;  Laterality: N/A;   Left knee surgery  2008   LEG SURGERY     RIGHT/LEFT HEART CATH AND CORONARY ANGIOGRAPHY N/A 01/10/2023   Procedure: RIGHT/LEFT HEART CATH AND CORONARY ANGIOGRAPHY;  Surgeon: Wendel Lurena POUR, MD;  Location: [facility] INVASIVE CV LAB;  Service: Cardiovascular;  Laterality: N/A;   TONSILLECTOMY     TOTAL HIP ARTHROPLASTY Left 06/11/2018   Procedure: LEFT TOTAL HIP ARTHROPLASTY ANTERIOR APPROACH;  Surgeon: Addie Cordella Hamilton, MD;  Location: [facility] OR;  Service: Orthopedics;  Laterality: Left;   TOTAL KNEE ARTHROPLASTY Left 08/22/2016   Procedure: TOTAL KNEE ARTHROPLASTY;  Surgeon: Addie Glendia Hacker, MD;  Location: [facility];  Service: Orthopedics;  Laterality: Left;   TUBAL LIGATION  1989   Social History   Socioeconomic History   Marital status: Divorced    Spouse name: Not on file   Number of children: 3   Years of education: Not on file   Highest education level: Not on file  Occupational History   Occupation: UNEMPLOYED    Employer: DISABLED  Tobacco Use   Smoking status: Former    Current packs/day: 0.00    Average packs/day: 0.5 packs/day for 15.0 years (7.5 ttl pk-yrs)    Types: Cigarettes    Start date: 04/24/1970    Quit date: 04/24/1985    Years since quitting: 39.0   Smokeless tobacco: Never  Vaping Use   Vaping status: Never Used  Substance and Sexual Activity   Alcohol use: No    Alcohol/week:  0.0 standard drinks of alcohol   Drug use: Not Currently    Types: Marijuana   Sexual activity: Not Currently    Birth control/protection: Surgical    Comment: hyst  Other Topics Concern   Not on file  Social History Narrative   Lives in [place] by herself.  Disabled.  Occasional Tea .  3 kids, 6 grandkids.     Water aerobics 3 times a week.   Social Drivers of Health   Tobacco Use: Medium Risk (05/15/2024)   Patient History    Smoking Tobacco Use: Former    Smokeless Tobacco Use: Never    Passive Exposure: Not on file  Financial Resource Strain: High Risk (03/27/2022)   Overall Financial Resource Strain (CARDIA)    Difficulty of Paying Living Expenses: Very hard  Food Insecurity: Food Insecurity Present (03/27/2022)   Hunger Vital Sign    Worried About Running Out of Food in the Last Year: Sometimes true    Ran Out of Food in the Last Year: Sometimes true  Transportation Needs: No Transportation Needs (03/27/2022)   PRAPARE - Administrator, Civil Service (Medical): No    Lack of Transportation (Non-Medical): No  Physical Activity: Not on file  Stress: Not on file  Social Connections: Not on file  Depression (PHQ2-9): Low Risk (05/05/2024)   Depression (PHQ2-9)    PHQ-2 Score: 1  Alcohol Screen: Low Risk (03/27/2022)   Alcohol Screen    Last Alcohol Screening Score (AUDIT): 0  Housing: Low Risk (03/27/2022)   Housing    Last Housing Risk Score: 0  Utilities: Not At Risk (03/27/2022)   AHC Utilities    Threatened with loss of utilities: No  Health Literacy: Not on file   Family History  Problem Relation Age of Onset   Emphysema Mother    Arthritis Mother    Heart failure Mother        alive @ 44   Stroke Mother    Diabetes Mother    Hypertension Mother    Hyperlipidemia Mother    Depression Mother    Anxiety disorder Mother    Heart disease Mother    Alcoholism Mother    Depression Father    Heart disease Father        died @ 81's.   Stroke Father     Diabetes Father    Hyperlipidemia Father    Hypertension Father    Bipolar disorder Father  Sleep apnea Father    Alcoholism Father    Drug abuse Father    Sudden death Father    Diabetes Sister    Asthma Brother    Colon cancer Maternal Grandfather        dx after 50   Heart disease Paternal Grandfather    Breast cancer Maternal Aunt 11 - 40   Breast cancer Cousin        dx 46s   Asthma Daughter    Esophageal cancer Neg Hx    Stomach cancer Neg Hx    Rectal cancer Neg Hx    Allergies[1] Prior to Admission medications  Medication Sig Start Date End Date Taking? Authorizing Provider  aspirin  EC 81 MG tablet Take 81 mg by mouth daily. Swallow whole.   Yes [provider]  atorvastatin  (LIPITOR) 40 MG tablet Take 40 mg by mouth at bedtime.    Yes [provider]  busPIRone  (BUSPAR ) 30 MG tablet Take 30 mg by mouth 2 (two) times daily. 12/18/19  Yes [provider]  carvedilol  (COREG ) 3.125 MG tablet Take 1 tablet (3.125 mg total) by mouth 2 (two) times daily. 06/07/23  Yes Wyn Jackee VEAR Mickey., NP  empagliflozin  (JARDIANCE ) 10 MG TABS tablet Take 1 tablet (10 mg total) by mouth daily. 03/28/22  Yes Arrien, Elidia Sieving, MD  EPINEPHrine  0.3 mg/0.3 mL IJ SOAJ injection Inject 0.3 mg into the muscle as needed for anaphylaxis. Follow package instructions as needed for severe allergy or anaphylactic reaction. 11/06/21  Yes Viviann Pastor, MD  fluticasone  furoate-vilanterol (BREO ELLIPTA ) 100-25 MCG/ACT AEPB TAKE 1 PUFF BY MOUTH EVERY DAY Patient taking differently: Inhale 1 puff into the lungs daily as needed (shortness of breath). 03/31/24  Yes Meade Verdon RAMAN, MD  furosemide  (LASIX ) 40 MG tablet Take 1 tablet (40 mg total) by mouth daily as needed for edema or fluid (take in case of weight gain 2 to 3 lbs in 24 hrs or 5 lbs in 7 days.). 03/28/22  Yes Arrien, Mauricio Daniel, MD  hydrOXYzine  (VISTARIL ) 25 MG capsule Take 25 mg by mouth 3 (three) times daily as  needed for itching.   Yes [provider]  KLOR-CON  M10 10 MEQ tablet Take 10 mEq by mouth 2 (two) times daily. 12/07/22  Yes [provider]  lidocaine  (LIDODERM ) 5 % Place 1 patch onto the skin daily as needed (pain).   Yes [provider]  losartan  (COZAAR ) 25 MG tablet Take 1 tablet (25 mg total) by mouth daily. 02/26/24 05/15/24 Yes Campbell, Kenzie E, NP  metFORMIN  (GLUCOPHAGE -XR) 500 MG 24 hr tablet Take 1 tablet (500 mg total) by mouth daily with breakfast. 05/07/24  Yes Danford, Rockie BIRCH, NP  NON FORMULARY Pt uses a cpap nightly   Yes [provider]  nystatin  (MYCOSTATIN /NYSTOP ) powder Apply 1 Application topically 3 (three) times daily. Patient taking differently: Apply 1 Application topically 3 (three) times daily as needed (rash). 02/29/24  Yes Iruku, Praveena, MD  pantoprazole  (PROTONIX ) 40 MG tablet Take 40 mg by mouth daily. 10/12/23  Yes [provider]  Semaglutide  14 MG TABS Take 14 mg by mouth daily.   Yes [provider]  sertraline  (ZOLOFT ) 25 MG tablet Take 1 tablet (25 mg total) by mouth daily. 01/21/24  Yes Rayburn, Elouise Phlegm, PA-C  spironolactone  (ALDACTONE ) 25 MG tablet Take 25 mg by mouth daily. 09/22/23  Yes [provider]  Vitamin D , Ergocalciferol , (DRISDOL ) 1.25 MG (50000 UNIT) CAPS capsule Take 1 capsule (  50,000 Units total) by mouth every 7 (seven) days. 05/07/24  Yes Danford, Rockie BIRCH, NP  ACCU-CHEK GUIDE test strip  10/28/21   [provider]  Accu-Chek Softclix Lancets lancets SMARTSIG:Topical 12/14/23   [provider]  albuterol  (PROVENTIL  HFA;VENTOLIN  HFA) 108 (90 Base) MCG/ACT inhaler Inhale 2 puffs into the lungs every 6 (six) hours as needed for wheezing or shortness of breath.    [provider]  albuterol  (VENTOLIN  HFA) 108 (90 Base) MCG/ACT inhaler Inhale 1-2 puffs into the lungs every 6 (six) hours as needed for wheezing or shortness of breath. 07/25/23   Horton, Roxie HERO,  DO  Blood Glucose Monitoring Suppl (ACCU-CHEK GUIDE ME) w/Device KIT daily. 12/14/23   [provider]  cyclobenzaprine  (FLEXERIL ) 10 MG tablet Take 1 tablet (10 mg total) by mouth 2 (two) times daily as needed for muscle spasms. Patient not taking: Reported on 05/07/2024 02/15/23   Elnor Jayson LABOR, DO  ipratropium-albuterol  (DUONEB) 0.5-2.5 (3) MG/3ML SOLN Take 3 mLs by nebulization every 6 (six) hours as needed (shortness of breath). 11/22/22   [provider]  loperamide  (IMODIUM ) 2 MG capsule Take 1 capsule (2 mg total) by mouth 4 (four) times daily as needed for diarrhea or loose stools. 01/07/22   Long, Joshua G, MD  mupirocin ointment (BACTROBAN) 2 % Apply 1 Application topically 2 (two) times daily as needed (irritation). 11/30/23   [provider]  nitroGLYCERIN  (NITROSTAT ) 0.3 MG SL tablet Place 0.3 mg under the tongue every 5 (five) minutes as needed for chest pain.    [provider]  ondansetron  (ZOFRAN -ODT) 4 MG disintegrating tablet Take 1 tablet (4 mg total) by mouth every 8 (eight) hours as needed for nausea or vomiting. 10/15/22   Gretta, Meghan R, PA-C  terbinafine  (LAMISIL ) 250 MG tablet TAKE 1 TABLET BY MOUTH EVERY DAY Patient not taking: No sig reported 04/03/24   Magdalen Pasco RAMAN, DPM  triamcinolone  ointment (KENALOG ) 0.1 % Apply 1 Application topically daily as needed (irritation).    [provider]  valACYclovir  (VALTREX ) 500 MG tablet Take 500 mg by mouth daily as needed (fever blisters).    [provider]     All other systems have been reviewed and were otherwise negative with the exception of those mentioned in the HPI and as above.  Physical Exam: Vitals:   05/15/24 0615  BP: 113/77  Pulse: 76  Resp: 20  Temp: 98.5 F (36.9 C)  SpO2: 97%    Body mass index is 40.13 kg/m.  General: Alert, no acute distress Cardiovascular: No pedal edema Respiratory: No cyanosis, no use of accessory musculature Skin: No  lesions in the area of chief complaint Neurologic: Sensation intact distally Psychiatric: Patient is competent for consent with normal mood and affect Lymphatic: No axillary or cervical lymphadenopathy   Assessment/Plan: LUMBAR RADICULOPATHY Plan for Procedures: TRANSFORAMINAL LUMBAR INTERBODY FUSION (TLIF) WITH PEDICLE SCREW FIXATION 1 LEVEL (L4/5)   Oneil LITTIE Priestly, MD 05/15/2024 7:11 AM    [1]  Allergies Allergen Reactions   Shellfish Allergy Hives and Anaphylaxis   Hydrocodone -Acetaminophen  Itching and Other (See Comments)    confusion Mental Status Changes   Penicillins Swelling and Other (See Comments)   Gabapentin      Not available   Latex Rash   Sulfa Antibiotics Diarrhea, Itching and Rash   "

## 2024-05-15 NOTE — Anesthesia Postprocedure Evaluation (Signed)
"   Anesthesia Post Note  Patient: [name]  Procedure(s) Performed: TRANSFORAMINAL LUMBAR INTERBODY FUSION (TLIF) WITH PEDICLE SCREW FIXATION 1 LEVEL (Left)     Patient location during evaluation: PACU Anesthesia Type: General Level of consciousness: awake Pain management: pain level controlled Vital Signs Assessment: post-procedure vital signs reviewed and stable Respiratory status: spontaneous breathing Cardiovascular status: blood pressure returned to baseline Postop Assessment: no apparent nausea or vomiting and adequate PO intake Anesthetic complications: no   No notable events documented.  Last Vitals:  Vitals:   05/15/24 1245 05/15/24 1300  BP: 136/79 (!) 149/84  Pulse: 76   Resp: 12 15  Temp:    SpO2:  98%    Last Pain:  Vitals:   05/15/24 1300  TempSrc:   PainSc: 8                  [name] T Colhoun      "

## 2024-05-15 NOTE — Transfer of Care (Signed)
 Immediate Anesthesia Transfer of Care Note  Patient: [name]  Procedure(s) Performed: TRANSFORAMINAL LUMBAR INTERBODY FUSION (TLIF) WITH PEDICLE SCREW FIXATION 1 LEVEL (Left)  Patient Location: PACU  Anesthesia Type:General  Level of Consciousness: awake, oriented, and patient cooperative  Airway & Oxygen Therapy: Patient Spontanous Breathing and Patient connected to nasal cannula oxygen  Post-op Assessment: Report given to RN, Post -op Vital signs reviewed and stable, and Patient moving all extremities X 4  Post vital signs: Reviewed and stable  Last Vitals:  Vitals Value Taken Time  BP 151/81 05/15/24 10:45  Temp 36.2 C 05/15/24 10:45  Pulse 77 05/15/24 10:52  Resp 13 05/15/24 10:52  SpO2 100 % 05/15/24 10:52  Vitals shown include unfiled device data.  Last Pain:  Vitals:   05/15/24 1045  TempSrc:   PainSc: Asleep      Patients Stated Pain Goal: 1 (05/15/24 0704)  Complications: No notable events documented.

## 2024-05-15 NOTE — Op Note (Signed)
 PATIENT NAME: [name]   MEDICAL RECORD NO.:   [id number]   DATE OF BIRTH: [birth year]   DATE OF PROCEDURE: 05/15/2024                               OPERATIVE REPORT     PREOPERATIVE DIAGNOSES: 1. Bilateral lumbar radiculopathy 2. L4-5 spinal stenosis 3. L4-5 spondylolisthesis   POSTOPERATIVE DIAGNOSES: 1. Bilateral lumbar radiculopathy 2. L4-5 spinal stenosis 3. L4-5 spondylolisthesis   PROCEDURES: 1. L4/5 decompression, including bilateral lateral recess decompression 2. Left-sided L4-5 transforaminal lumbar interbody fusion 3. Riht-sided L4-5 posterolateral fusion 4. Insertion of interbody device x1 (Globus expandable intervertebral spacer) 5. Placement of segmental posterior instrumentation L4, L5 bilaterally  6. Use of local autograft 7. Use of morselized allograft - Vivigen 8. Intraoperative use of fluoroscopy   SURGEON:  Oneil Priestly, MD   ASSISTANT:  Ileana Clara, PA-C   ANESTHESIA:  General endotracheal anesthesia.   COMPLICATIONS:  None   DISPOSITION:  Stable   ESTIMATED BLOOD LOSS:  100cc   INDICATIONS FOR SURGERY:  Briefly,  Ms. Rizo is a pleasant 64 -year-old female who did present to me with severe and ongoing pain in the right and left legs. I did feel that the symptoms were secondary to the findings noted above.   The patient failed conservative care and did wish to proceed with the procedure  noted above.   OPERATIVE DETAILS:  On 05/15/2024, the patient was brought to surgery and general endotracheal anesthesia was administered.  The patient was placed prone on a well-padded flat Jackson bed with a spinal frame.  Antibiotics were given and a time-out procedure was performed. The back was prepped and draped in the usual fashion.  A midline incision was made overlying the L4-5 intervertebral spaces.  The fascia was incised at the midline.  The paraspinal musculature was bluntly swept laterally.  At this point, the right L4-5 facet  joint and posterolateral gutter was subperiosteally exposed and decorticated using a high-speed bur, in anticipation for a posterolateral fusion. Anatomic landmarks for the pedicles were exposed. Using fluoroscopy, I did cannulate the L4 and L5 pedicles bilaterally, using a medial to lateral cortical trajectory technique.  At this point, 6 x 30 mm screws were placed into the right pedicles, and a 40 mm rod was placed into the tulip heads of the screw, and caps were also placed.  Distraction was then applied across the L4-5 intervertebral space, and the caps were then provisionally tightened.  On the left side, bone wax was placed into the cannulated pedicle holes.  I then proceeded with the decompressive aspect of the procedure at the L4-5 level.  A partial facetectomy was performed bilaterally at L4-5, decompressing the L4-5 intervertebral space.  I was very pleased with the decompression. With an assistant holding medial retraction of the traversing left L5 nerve, I did perform an annulotomy at the posterolateral aspect of the L4-5 intervertebral space.  I then used a series of curettes and pituitary rongeurs to perform a thorough and complete intervertebral diskectomy.  The intervertebral space was then liberally packed with autograft as well as allograft in the form of Vivigen, as was the appropriate-sized intervertebral spacer.  The spacer was then tamped into position in the usual fashion, and expanded to 8.7 mm in height. I was very pleased with the press-fit of the spacer.  I then placed 6 mm screws on the left at L4 and  L5. A 40-mm rod was then placed and caps were placed. The distraction was then released on the contralateral side.  All caps were then locked.  The wound was copiously irrigated with a total of approximately 3 L prior to placing the bone graft.  Additional autograft and allograft was then packed into the posterolateral gutter on the right side to help aid in the success of the  fusion.  This area was previously decorticated as previously documented.  The wound was explored for any undue bleeding and there was no substantial bleeding encountered.  Gel-Foam was placed over the laminectomy site.  The wound was then closed in layers using #1 Vicryl followed by 2-0 Vicryl, followed by 4-0 Monocryl.  Benzoin and Steri-Strips were applied followed by sterile dressing.     Of note, Ileana Clara was my assistant throughout surgery, and did aid in retraction, suctioning, the decompression, placement of the hardware, and closure.       Oneil Priestly, MD

## 2024-05-15 NOTE — Progress Notes (Signed)
" °   05/15/24 1558  Vitals  Temp 97.9 F (36.6 C)  Temp Source Oral  BP (!) 168/82  MAP (mmHg) 105  BP Location Left Arm  BP Method Automatic  Patient Position (if appropriate) Lying  Pulse Rate (!) 57  Pulse Rate Source Monitor  ECG Heart Rate (!) 57  Resp 14  Level of Consciousness  Level of Consciousness Alert  MEWS COLOR  MEWS Score Color Green  Oxygen Therapy  SpO2 98 %  O2 Device Nasal Cannula  O2 Flow Rate (L/min) 2 L/min  MEWS Score  MEWS Temp 0  MEWS Systolic 0  MEWS Pulse 0  MEWS RR 0  MEWS LOC 0  MEWS Score 0   Pt  admitted to the unit from PACU. Pt is Ax0x4 . Pt VSS and assessment completed. Pt is oriented to the unit. Pt belongings are at the bedside with the patient. Two person skin assessment completed with Romualdo Nightingale RN. Pt family is also at bedside. Call-bell within reach and bed in lowest position.  "

## 2024-05-15 NOTE — Plan of Care (Signed)
" °  Problem: Education: Goal: Knowledge of General Education information will improve Description: Including pain rating scale, medication(s)/side effects and non-pharmacologic comfort measures Outcome: Progressing   Problem: Health Behavior/Discharge Planning: Goal: Ability to manage health-related needs will improve Outcome: Progressing   Problem: Clinical Measurements: Goal: Ability to maintain clinical measurements within normal limits will improve Outcome: Progressing Goal: Will remain free from infection Outcome: Progressing Goal: Diagnostic test results will improve Outcome: Progressing Goal: Respiratory complications will improve Outcome: Progressing Goal: Cardiovascular complication will be avoided Outcome: Progressing   Problem: Activity: Goal: Risk for activity intolerance will decrease Outcome: Progressing   Problem: Nutrition: Goal: Adequate nutrition will be maintained Outcome: Progressing   Problem: Coping: Goal: Level of anxiety will decrease Outcome: Progressing   Problem: Elimination: Goal: Will not experience complications related to bowel motility Outcome: Progressing Goal: Will not experience complications related to urinary retention Outcome: Progressing   Problem: Pain Managment: Goal: General experience of comfort will improve and/or be controlled Outcome: Progressing   Problem: Safety: Goal: Ability to remain free from injury will improve Outcome: Progressing   Problem: Skin Integrity: Goal: Risk for impaired skin integrity will decrease Outcome: Progressing   Problem: Education: Goal: Knowledge of the prescribed therapeutic regimen will improve Outcome: Progressing   Problem: Bowel/Gastric: Goal: Gastrointestinal status for postoperative course will improve Outcome: Progressing   Problem: Cardiac: Goal: Ability to maintain an adequate cardiac output Outcome: Progressing Goal: Will show no evidence of cardiac arrhythmias Outcome:  Progressing   Problem: Nutritional: Goal: Will attain and maintain optimal nutritional status Outcome: Progressing   Problem: Neurological: Goal: Will regain or maintain usual level of consciousness Outcome: Progressing   Problem: Clinical Measurements: Goal: Ability to maintain clinical measurements within normal limits Outcome: Progressing Goal: Postoperative complications will be avoided or minimized Outcome: Progressing   Problem: Respiratory: Goal: Will regain and/or maintain adequate ventilation Outcome: Progressing Goal: Respiratory status will improve Outcome: Progressing   Problem: Skin Integrity: Goal: Demonstrates signs of wound healing without infection Outcome: Progressing   Problem: Urinary Elimination: Goal: Will remain free from infection Outcome: Progressing Goal: Ability to achieve and maintain adequate urine output Outcome: Progressing   Problem: Education: Goal: Ability to verbalize activity precautions or restrictions will improve Outcome: Progressing Goal: Knowledge of the prescribed therapeutic regimen will improve Outcome: Progressing Goal: Understanding of discharge needs will improve Outcome: Progressing   Problem: Activity: Goal: Ability to avoid complications of mobility impairment will improve Outcome: Progressing Goal: Ability to tolerate increased activity will improve Outcome: Progressing Goal: Will remain free from falls Outcome: Progressing   Problem: Bowel/Gastric: Goal: Gastrointestinal status for postoperative course will improve Outcome: Progressing   Problem: Clinical Measurements: Goal: Ability to maintain clinical measurements within normal limits will improve Outcome: Progressing Goal: Postoperative complications will be avoided or minimized Outcome: Progressing Goal: Diagnostic test results will improve Outcome: Progressing   Problem: Pain Management: Goal: Pain level will decrease Outcome: Progressing    Problem: Skin Integrity: Goal: Will show signs of wound healing Outcome: Progressing   Problem: Health Behavior/Discharge Planning: Goal: Identification of resources available to assist in meeting health care needs will improve Outcome: Progressing   Problem: Bladder/Genitourinary: Goal: Urinary functional status for postoperative course will improve Outcome: Progressing   "

## 2024-05-16 ENCOUNTER — Encounter (HOSPITAL_COMMUNITY): Payer: Self-pay | Admitting: Orthopedic Surgery

## 2024-05-16 DIAGNOSIS — Z96652 Presence of left artificial knee joint: Secondary | ICD-10-CM | POA: Diagnosis not present

## 2024-05-16 DIAGNOSIS — M4316 Spondylolisthesis, lumbar region: Secondary | ICD-10-CM | POA: Diagnosis not present

## 2024-05-16 DIAGNOSIS — E119 Type 2 diabetes mellitus without complications: Secondary | ICD-10-CM | POA: Diagnosis not present

## 2024-05-16 DIAGNOSIS — I509 Heart failure, unspecified: Secondary | ICD-10-CM | POA: Diagnosis not present

## 2024-05-16 DIAGNOSIS — I25119 Atherosclerotic heart disease of native coronary artery with unspecified angina pectoris: Secondary | ICD-10-CM | POA: Diagnosis not present

## 2024-05-16 DIAGNOSIS — Z87891 Personal history of nicotine dependence: Secondary | ICD-10-CM | POA: Diagnosis not present

## 2024-05-16 DIAGNOSIS — M48061 Spinal stenosis, lumbar region without neurogenic claudication: Secondary | ICD-10-CM | POA: Diagnosis not present

## 2024-05-16 DIAGNOSIS — Z96642 Presence of left artificial hip joint: Secondary | ICD-10-CM | POA: Diagnosis not present

## 2024-05-16 DIAGNOSIS — J45909 Unspecified asthma, uncomplicated: Secondary | ICD-10-CM | POA: Diagnosis not present

## 2024-05-16 DIAGNOSIS — M5416 Radiculopathy, lumbar region: Secondary | ICD-10-CM | POA: Diagnosis not present

## 2024-05-16 DIAGNOSIS — I110 Hypertensive heart disease with heart failure: Secondary | ICD-10-CM | POA: Diagnosis not present

## 2024-05-16 DIAGNOSIS — Z85830 Personal history of malignant neoplasm of bone: Secondary | ICD-10-CM | POA: Diagnosis not present

## 2024-05-16 MED ORDER — METHOCARBAMOL 500 MG PO TABS: 500.0000 mg | ORAL_TABLET | Freq: Four times a day (QID) | ORAL | 2 refills | Status: AC | PRN

## 2024-05-16 MED ORDER — OXYCODONE-ACETAMINOPHEN 5-325 MG PO TABS: 1.0000 | ORAL_TABLET | ORAL | 0 refills | Status: AC | PRN

## 2024-05-16 MED ORDER — SEMAGLUTIDE 14 MG PO TABS
14.0000 mg | ORAL_TABLET | Freq: Every day | ORAL | Status: DC
  Administered 2024-05-17 – 2024-05-20 (×4): 14 mg via ORAL
  Filled 2024-05-16 (×9): qty 1

## 2024-05-16 MED FILL — Thrombin For Soln 20000 Unit: CUTANEOUS | Qty: 1 | Status: AC

## 2024-05-16 NOTE — Evaluation (Signed)
 Occupational Therapy Evaluation Patient Details Name: [name] MRN: [id number] DOB: [birth year] Today's Date: 05/16/2024   History of Present Illness   64 yo F adm 1/22 TLIF L4-5 PMH arthritis, anxiety, CHF, clotting disorder, CAD, depression, DM, HLD, HTN, IBS, mediastinal mass, PE, RA, sleep apnea on CPAP,     Clinical Impressions Patient is s/p TLIF L4-5 surgery resulting in functional limitations due to the deficits listed below (see OT problem list). Pt currently requires (A) for LB and continued education for AE. Pt currently reports chest pain so returned to supine for further eval by RN staff.  Patient will benefit from skilled OT acutely to increase independence and safety with ADLS to allow discharge  intensive inpatient follow-up therapy, >3 hours/day .      If plan is discharge home, recommend the following:   A lot of help with walking and/or transfers;A lot of help with bathing/dressing/bathroom     Functional Status Assessment   Patient has had a recent decline in their functional status and demonstrates the ability to make significant improvements in function in a reasonable and predictable amount of time.     Equipment Recommendations   Other (comment) (RW)     Recommendations for Other Services   Rehab consult     Precautions/Restrictions   Precautions Precautions: Back Recall of Precautions/Restrictions: Intact Required Braces or Orthoses: Spinal Brace Spinal Brace: Thoracolumbosacral orthotic;Applied in sitting position Restrictions Weight Bearing Restrictions Per Provider Order: No     Mobility Bed Mobility Overal bed mobility: Needs Assistance Bed Mobility: Rolling, Sit to Supine Rolling: Min assist     Sit to supine: Mod assist   General bed mobility comments: returned to supine to due to chest pain reported    Transfers Overall transfer level: Needs assistance Equipment used: Rolling walker (2 wheels) Transfers: Sit  to/from Stand Sit to Stand: Mod assist, From elevated surface                  Balance Overall balance assessment: Mild deficits observed, not formally tested                                         ADL either performed or assessed with clinical judgement   ADL Overall ADL's : Needs assistance/impaired Eating/Feeding: Set up   Grooming: Set up   Upper Body Bathing: Minimal assistance;Sitting   Lower Body Bathing: Maximal assistance;Sit to/from stand       Lower Body Dressing: Maximal assistance;Sit to/from stand   Toilet Transfer: Minimal assistance;Ambulation;BSC/3in1;Rolling walker (2 wheels)   Toileting- Clothing Manipulation and Hygiene: Maximal assistance;Sit to/from stand Toileting - Clothing Manipulation Details (indicate cue type and reason): peri care     Functional mobility during ADLs: Minimal assistance;Rolling walker (2 wheels)       Vision Baseline Vision/History: 1 Wears glasses Vision Assessment?: Wears glasses for reading     Perception         Praxis         Pertinent Vitals/Pain       Extremity/Trunk Assessment Upper Extremity Assessment Upper Extremity Assessment: Right hand dominant;RUE deficits/detail;LUE deficits/detail RUE Deficits / Details: decreased grasp noted LUE Deficits / Details: decreased grasp   Lower Extremity Assessment Lower Extremity Assessment: Overall WFL for tasks assessed   Cervical / Trunk Assessment Cervical / Trunk Assessment: Back Surgery   Communication Communication Communication: No apparent difficulties   Cognition  Arousal: Alert Behavior During Therapy: WFL for tasks assessed/performed Cognition: No apparent impairments                               Following commands: Intact       Cueing  General Comments      incision dressed at this time   Exercises     Shoulder Instructions      Home Living Family/patient expects to be discharged to:: Private  residence Living Arrangements: Alone Available Help at Discharge: Family;Available PRN/intermittently Type of Home: Apartment Home Access: Level entry     Home Layout: One level     Bathroom Shower/Tub: Chief Strategy Officer: Standard     Home Equipment: Shower seat;Cane - single point;Adaptive equipment Adaptive Equipment: Reacher 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, works as companionship - driving stairs cleaning      Prior Functioning/Environment Prior Level of Function : Working/employed;Driving                    OT Problem List: Decreased strength;Decreased activity tolerance;Impaired balance (sitting and/or standing);Decreased knowledge of precautions;Decreased knowledge of use of DME or AE;Decreased safety awareness;Obesity   OT Treatment/Interventions: Self-care/ADL training;DME and/or AE instruction;Therapeutic activities;Balance training;Patient/family education;Therapeutic exercise      OT Goals(Current goals can be found in the care plan section)   Acute Rehab OT Goals Patient Stated Goal: to get rehab OT Goal Formulation: With patient Time For Goal Achievement: 05/30/24 Potential to Achieve Goals: Good   OT Frequency:  Min 2X/week    Co-evaluation              AM-PAC OT 6 Clicks Daily Activity     Outcome Measure Help from another person eating meals?: None Help from another person taking care of personal grooming?: A Little Help from another person toileting, which includes using toliet, bedpan, or urinal?: A Lot Help from another person bathing (including washing, rinsing, drying)?: A Lot Help from another person to put on and taking off regular upper body clothing?: A Lot Help from another person to put on and taking off regular lower body clothing?: A Lot 6 Click Score: 15   End of Session Equipment Utilized During Treatment: Gait belt Nurse Communication: Mobility status;Precautions  Activity Tolerance: Patient  tolerated treatment well Patient left: in bed;with call bell/phone within reach;with bed alarm set  OT Visit Diagnosis: Unsteadiness on feet (R26.81);Muscle weakness (generalized) (M62.81)                Time: 8848-8797 OT Time Calculation (min): 11 min Charges:  OT General Charges $OT Visit: 1 Visit OT Evaluation $OT Eval Moderate Complexity: 1 Mod   Brynn, OTR/L  Acute Rehabilitation Services Office: [phone] .   Ely Molt 05/16/2024, 1:59 PM

## 2024-05-16 NOTE — Progress Notes (Signed)

## 2024-05-16 NOTE — Progress Notes (Signed)
 Physical Therapy Treatment Patient Details Name: [name] MRN: [id number] DOB: 1960/07/21 Today's Date: 05/16/2024   History of Present Illness 64 yo F adm 1/22 TLIF L4-5 PMH arthritis, anxiety, CHF, clotting disorder, CAD, depression, DM, HLD, HTN, IBS, mediastinal mass, PE, RA, sleep apnea on CPAP,    PT Comments  Although continues to have sciatica, this patient has mad great progress toward goals.  Also emphasized education on back care, progression of activity.  Pt has no consistent care per pt and will likely need a stay at with the family.  Patient will benefit from continued inpatient follow up therapy, <3 hours/day     If plan is discharge home, recommend the following: A little help with walking and/or transfers;A little help with bathing/dressing/bathroom;Assistance with cooking/housework   Can travel by private vehicle     No  Equipment Recommendations  Rolling walker (2 wheels);Rollator (4 wheels) (TBD after further gait training and rehab)    Recommendations for Other Services       Precautions / Restrictions Precautions Precautions: Back Recall of Precautions/Restrictions: Intact Required Braces or Orthoses: Spinal Brace Spinal Brace: Thoracolumbosacral orthotic;Applied in sitting position Restrictions Weight Bearing Restrictions Per Provider Order: No     Mobility  Bed Mobility                    Transfers Overall transfer level: Needs assistance Equipment used: Rolling walker (2 wheels) Transfers: Sit to/from Stand Sit to Stand: Mod assist, From elevated surface, Min assist                Ambulation/Gait Ambulation/Gait assistance: Min assist Gait Distance (Feet): 75 Feet Assistive device: Rolling walker (2 wheels) Gait Pattern/deviations: Step-through pattern, Decreased stride length   Gait velocity interpretation: <1.8 ft/sec, indicate of risk for recurrent falls   General Gait Details: slow, short mildly unsteady steps   with heavy use of the RW   Stairs             Wheelchair Mobility     Tilt Bed    Modified Rankin (Stroke Patients Only)       Balance Overall balance assessment: Mild deficits observed, not formally tested, Needs assistance Sitting-balance support: No upper extremity supported, Feet supported Sitting balance-Leahy Scale: Good     Standing balance support: Bilateral upper extremity supported, During functional activity Standing balance-Leahy Scale: Poor                              Communication Communication Communication: No apparent difficulties  Cognition Arousal: Alert Behavior During Therapy: WFL for tasks assessed/performed                             Following commands: Intact      Cueing    Exercises      General Comments General comments (skin integrity, edema, etc.): pt instructed in back care/prec., log roll, safe transitions, donning the brace, and progression of gait once d/c'd.      Pertinent Vitals/Pain Pain Assessment Pain Assessment: Faces Faces Pain Scale: Hurts even more Pain Location: back and buttock down to knee Pain Descriptors / Indicators: Aching, Stabbing, Other (Comment), Spasm (zapping) Pain Intervention(s): Monitored during session, Premedicated before session    Home Living Family/patient expects to be discharged to:: Private residence Living Arrangements: Alone Available Help at Discharge: Family;Available PRN/intermittently Type of Home: Apartment Home Access: Level entry  Home Layout: One level Home Equipment: Shower seat;Cane - single point;Adaptive equipment 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, works as companionship - driving stairs cleaning    Prior Function            PT Goals (current goals can now be found in the care plan section) Acute Rehab PT Goals Patient Stated Goal: able to be independent before I get home PT Goal Formulation: With patient Time For Goal Achievement:  05/30/24 Potential to Achieve Goals: Good    Frequency    Min 3X/week      PT Plan      Co-evaluation              AM-PAC PT 6 Clicks Mobility   Outcome Measure  Help needed turning from your back to your side while in a flat bed without using bedrails?: A Little Help needed moving from lying on your back to sitting on the side of a flat bed without using bedrails?: A Lot Help needed moving to and from a bed to a chair (including a wheelchair)?: A Little Help needed standing up from a chair using your arms (e.g., wheelchair or bedside chair)?: A Little Help needed to walk in hospital room?: A Lot Help needed climbing 3-5 steps with a railing? : A Lot 6 Click Score: 15    End of Session Equipment Utilized During Treatment: Gait belt Activity Tolerance: Patient tolerated treatment well Patient left: in bed;with call bell/phone within reach;with bed alarm set Nurse Communication: Mobility status PT Visit Diagnosis: Other abnormalities of gait and mobility (R26.89);Difficulty in walking, not elsewhere classified (R26.2);Pain Pain - Right/Left: Right Pain - part of body:  (buttock, hip radiating down)     Time: 8340-8270 PT Time Calculation (min) (ACUTE ONLY): 30 min  Charges:    $Gait Training: 8-22 mins $Self Care/Home Management: 8-22 PT General Charges $$ ACUTE PT VISIT: 1 Visit                     05/16/2024  [place] HERO., PT Acute Rehabilitation Services [phone]  (office)   Vinie GAILS Jahmarion Popoff 05/16/2024, 7:08 PM

## 2024-05-16 NOTE — Progress Notes (Signed)
" ° ° °  Patient doing well  Patient denies leg pain   Physical Exam: Vitals:   05/16/24 0002 05/16/24 0326  BP: 136/73   Pulse:    Resp: 16   Temp: 98.1 F (36.7 C) 99 F (37.2 C)  SpO2:      Dressing in place NVI  POD #1 s/p L4/5 decompression and fusion, doing well - requested rehab transfer  - up with PT/OT, encourage ambulation - Percocet for pain, Robaxin  for muscle spasms - transfer to rehab once bed is arranged "

## 2024-05-16 NOTE — Care Management Obs Status (Signed)
 MEDICARE OBSERVATION STATUS NOTIFICATION   Patient Details  Name: [name] MRN: [id number] Date of Birth: [birth year]   Medicare Observation Status Notification Given:  Yes    Jennie Laneta Dragon 05/16/2024, 1:48 PM

## 2024-05-17 DIAGNOSIS — M48061 Spinal stenosis, lumbar region without neurogenic claudication: Secondary | ICD-10-CM | POA: Diagnosis not present

## 2024-05-17 MED ORDER — WHITE PETROLATUM EX OINT
TOPICAL_OINTMENT | CUTANEOUS | Status: DC | PRN
  Administered 2024-05-17: 0.2 via TOPICAL
  Filled 2024-05-17: qty 28.35

## 2024-05-17 NOTE — Progress Notes (Signed)
 Occupational Therapy Treatment Patient Details Name: [name] MRN: [id number] DOB: 1960/06/20 Today's Date: 05/17/2024   History of present illness 64 yo F adm 1/22 TLIF L4-5 PMH arthritis, anxiety, CHF, clotting disorder, CAD, depression, DM, HLD, HTN, IBS, mediastinal mass, PE, RA, sleep apnea on CPAP,   OT comments  Pt. Seen for skilled OT treatment session with dtr present for duration of session.  Pt. Requires max a for donning back brace.  Min/mod a for sit/stand with cues for hand placement.  Standing grooming task with CGA.  Plan for A/E use next session for LB ADLs. Cont. With acute OT POC.        If plan is discharge home, recommend the following:  A lot of help with walking and/or transfers;A lot of help with bathing/dressing/bathroom   Equipment Recommendations  Other (comment)    Recommendations for Other Services Rehab consult    Precautions / Restrictions Precautions Precautions: Back Recall of Precautions/Restrictions: Intact Required Braces or Orthoses: Spinal Brace Spinal Brace: Thoracolumbosacral orthotic;Applied in sitting position Restrictions Other Position/Activity Restrictions: pt. able to state 3/3 back precuations       Mobility Bed Mobility               General bed mobility comments: seated eob upon arrival    Transfers Overall transfer level: Needs assistance   Transfers: Sit to/from Stand, Bed to chair/wheelchair/BSC Sit to Stand: Mod assist, From elevated surface, Min assist     Step pivot transfers: Min assist     General transfer comment: cues for hand placement, pushing through arms vs pulling on walker. cues for backing up to chair and reaching for arm rests prior to sitting down     Balance                                           ADL either performed or assessed with clinical judgement   ADL Overall ADL's : Needs assistance/impaired     Grooming: Wash/dry hands;Contact guard  assist;Standing           Upper Body Dressing : Maximal assistance;Sitting Upper Body Dressing Details (indicate cue type and reason): don brace Lower Body Dressing: Maximal assistance;Sitting/lateral leans Lower Body Dressing Details (indicate cue type and reason): unable to achieve figure four, reports she will likely need A/E will plan for next session Toilet Transfer: Minimal assistance;Ambulation;Rolling walker (2 wheels);Cueing for sequencing;Cueing for safety Toilet Transfer Details (indicate cue type and reason): amb. to/from b.room but declined need for use         Functional mobility during ADLs: Minimal assistance;Rolling walker (2 wheels) General ADL Comments: encouragement to engage and attempt tasks ie: hesitant to open door handle to b.room, encouragement to open it pt. was able to without lob with cues for sequencing and rw positioning    Extremity/Trunk Assessment              Vision       Perception     Praxis     Communication Communication Communication: No apparent difficulties   Cognition Arousal: Alert Behavior During Therapy: WFL for tasks assessed/performed Cognition: No apparent impairments                               Following commands: Intact        Cueing  Exercises      Shoulder Instructions       General Comments      Pertinent Vitals/ Pain       Pain Assessment Pain Assessment: 0-10 Pain Score: 10-Worst pain ever Pain Location: back Pain Descriptors / Indicators: Aching Pain Intervention(s): Limited activity within patient's tolerance, Repositioned, RN gave pain meds during session, Other (comment) (rn present at end of session giving pain meds)  Home Living                                          Prior Functioning/Environment              Frequency  Min 2X/week        Progress Toward Goals  OT Goals(current goals can now be found in the care plan section)   Progress towards OT goals: Progressing toward goals     Plan      Co-evaluation                 AM-PAC OT 6 Clicks Daily Activity     Outcome Measure   Help from another person eating meals?: None Help from another person taking care of personal grooming?: A Little Help from another person toileting, which includes using toliet, bedpan, or urinal?: A Lot Help from another person bathing (including washing, rinsing, drying)?: A Lot Help from another person to put on and taking off regular upper body clothing?: A Lot Help from another person to put on and taking off regular lower body clothing?: A Lot 6 Click Score: 15    End of Session Equipment Utilized During Treatment: Gait belt;Rolling walker (2 wheels);Back brace  OT Visit Diagnosis: Unsteadiness on feet (R26.81);Muscle weakness (generalized) (M62.81)   Activity Tolerance Patient tolerated treatment well   Patient Left in chair;with call bell/phone within reach;with chair alarm set;with family/visitor present;with nursing/sitter in room   Nurse Communication          Time: [phone] OT Time Calculation (min): 22 min  Charges: OT General Charges $OT Visit: 1 Visit OT Treatments $Self Care/Home Management : 8-22 mins  Randall, COTA/L Acute Rehabilitation [phone]   [name] Nest Lorraine-COTA/L  05/17/2024, 12:21 PM

## 2024-05-17 NOTE — Progress Notes (Signed)
 Inpatient Rehab Admissions Coordinator:    I spoke with Pt. Regarding potential CIR admit. She is interested but states her children all work and cannot assist at discharge. She is interested in SNF placement, ideally at [facility] as she has been there before.   Leita Kleine, MS, CCC-SLP Rehab Admissions Coordinator  [phone] (celll) [phone] (office)

## 2024-05-17 NOTE — Progress Notes (Signed)
.  Subjective: 2 Days Post-Op Procedures (LRB): TRANSFORAMINAL LUMBAR INTERBODY FUSION (TLIF) WITH PEDICLE SCREW FIXATION 1 LEVEL (Left)  Doing well. Pain in back doing ok  Objective: Vital signs in last 24 hours: Temp:  [98.2 F (36.8 C)-98.9 F (37.2 C)] 98.2 F (36.8 C) (01/24 1302) Pulse Rate:  [74-91] 74 (01/24 1302) Resp:  [18-20] 18 (01/24 1302) BP: (104-122)/(50-66) 105/59 (01/24 1302) SpO2:  [94 %-97 %] 96 % (01/24 1302)  Labs: No results for input(s): HGB in the last 72 hours. No results for input(s): WBC, RBC, HCT, PLT in the last 72 hours. No results for input(s): NA, K, CL, CO2, BUN, CREATININE, GLUCOSE, CALCIUM  in the last 72 hours. No results for input(s): LABPT, INR in the last 72 hours.  Physical Exam:   Dressing in place, CDI NVI  Assessment/Plan:  2 Days Post-Op Procedures (LRB):  L4/5 decompression and fusion, doing well - requested rehab transfer   - up with PT/OT, encourage ambulation - Percocet for pain, Robaxin  for muscle spasms - transfer to rehab once bed is arranged    Adine JAYSON Mon 05/17/2024, 2:01 PM

## 2024-05-17 NOTE — Progress Notes (Signed)
 PT Cancellation Note  Patient Details Name: [name] MRN: [id number] DOB: June 06, 1960   Cancelled Treatment:    Reason Eval/Treat Not Completed: Patient declined, no reason specified.  Just back to bed and I don't want to get up again and get to hurting again.  Tomorrow? 05/17/2024  Tara HERO., PT Acute Rehabilitation Services [phone]  (office)   [name] 05/17/2024, 5:55 PM

## 2024-05-18 DIAGNOSIS — M48061 Spinal stenosis, lumbar region without neurogenic claudication: Secondary | ICD-10-CM | POA: Diagnosis not present

## 2024-05-18 MED ORDER — HYDROXYZINE HCL 25 MG PO TABS
25.0000 mg | ORAL_TABLET | Freq: Three times a day (TID) | ORAL | Status: DC | PRN
  Administered 2024-05-18 – 2024-05-20 (×4): 25 mg via ORAL
  Filled 2024-05-18 (×4): qty 1

## 2024-05-18 NOTE — Progress Notes (Signed)
" ° ° ° °  [name] is a 64 y.o. female   Orthopaedic diagnosis: 3 Days Post-Op Procedures (LRB): TRANSFORAMINAL LUMBAR INTERBODY FUSION (TLIF) WITH PEDICLE SCREW FIXATION 1 LEVEL (Left)  Subjective:  Patient was in the shower during visit.  Per nursing she is doing well and having no complaints.  Therapy at bedside awaiting evaluation and treatment.  Objectyive: Vitals:   05/18/24 0815 05/18/24 1148  BP: (!) 108/54 116/62  Pulse: 94 83  Resp:  (!) 22  Temp:  98.3 F (36.8 C)  SpO2:  96%     Exam: Awake and alert Respirations even and unlabored No acute distress  (Per nursing) no issues with wound or dressing.  Patient mobilizing well.  Assessment: 3 days status post transforaminal lumbar interbody fusion.  She is doing well.  She worked well with physical therapy today.  No issues.   Plan: Awaiting SNF placement.  Social worker discussed with patient.  Awaiting offers and placement. Continue to work with physical therapy. Percocet for pain, Robaxin  for muscle spasms.   Medford Pae, MD  Telehealth visit.   "

## 2024-05-18 NOTE — TOC CM/SW Note (Signed)
 SDOH addressed and resources placed on AVS

## 2024-05-18 NOTE — Progress Notes (Signed)
Pt. States she can place cpap on herself when ready.

## 2024-05-18 NOTE — TOC Initial Note (Signed)
 Transition of Care [facility]) - Initial/Assessment Note    Patient Details  Name: [name] MRN: [id number] Date of Birth: Jun 11, 1960  Transition of Care [facility]) CM/SW Contact:    Jeoffrey LITTIE Maranda ISRAEL Phone Number: 05/18/2024, 11:59 AM  Clinical Narrative:                 Pt admitted from home for lumbar surgery. CSW spoke with pt over the phone and completed SNF workup. Pt is agreeable to SNF following hospital DC and stated her first choice is [place]. Pt also stated she lives alone and uses a cane at home. CSW completed Fl2 and sent out SNF referrals. CSW to follow up and provide bed offers.   Expected Discharge Plan: Skilled Nursing Facility Barriers to Discharge: Continued Medical Work up, SNF Pending bed offer, Insurance Authorization   Patient Goals and CMS Choice Patient states their goals for this hospitalization and ongoing recovery are:: SNF          Expected Discharge Plan and Services       Living arrangements for the past 2 months: Single Family Home                                      Prior Living Arrangements/Services Living arrangements for the past 2 months: Single Family Home Lives with:: Self Patient language and need for interpreter reviewed:: Yes Do you feel safe going back to the place where you live?: Yes      Need for Family Participation in Patient Care: No (Comment) Care giver support system in place?: Yes (comment)   Criminal Activity/Legal Involvement Pertinent to Current Situation/Hospitalization: No - Comment as needed  Activities of Daily Living      Permission Sought/Granted Permission sought to share information with : Facility Medical Sales Representative, Family Supports    Share Information with NAME: Bobetta  Permission granted to share info w AGENCY: SNFs  Permission granted to share info w Relationship: daughter  Permission granted to share info w Contact Information: [phone]  Emotional Assessment Appearance::  Appears stated age Attitude/Demeanor/Rapport: Engaged Affect (typically observed): Pleasant Orientation: : Oriented to Self, Oriented to Place, Oriented to  Time, Oriented to Situation Alcohol / Substance Use: Not Applicable Psych Involvement: No (comment)  Admission diagnosis:  Radiculopathy [M54.10] Patient Active Problem List   Diagnosis Date Noted   Radiculopathy 05/15/2024   Class 3 severe obesity due to excess calories with serious comorbidity and body mass index (BMI) of 40.0 to 44.9 in adult (HCC) 12/13/2023   Morbid obesity (HCC) 09/09/2023   Lung nodules 07/15/2022   Heart failure (HCC) 03/25/2022   Chest pain 03/24/2022   Multiple thyroid  nodules 11/28/2021   Genetic testing 11/22/2021   Family history of breast cancer 11/03/2021   Family history of colon cancer 11/03/2021   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   Back pain 06/01/2021   Right thyroid  nodule 05/31/2021   At risk for impaired metabolic function 04/12/2020   Unilateral primary osteoarthritis, left hip    Hip arthritis 06/11/2018   Class 3 severe obesity with serious comorbidity and body mass index (BMI) of 40.0 to 44.9 in adult (HCC) 05/29/2018   Other fatigue 11/20/2017   SOBOE (shortness of breath on exertion) 11/20/2017   Type 2 diabetes mellitus with hyperglycemia, without long-term current use of insulin  (HCC) 11/20/2017   Vitamin D  deficiency 11/20/2017  Anxiety and depression 11/20/2017   Other hyperlipidemia 11/20/2017   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/04/2016   Presence of left artificial knee joint 09/20/2016   Arthritis of knee 08/22/2016   Cervical radiculopathy 07/26/2016   Cervical disc disorder with radiculopathy 07/26/2016   Right arm pain 06/29/2016   Cervicalgia 06/29/2016   Primary osteoarthritis of left knee 06/29/2016   Hypersomnia with sleep apnea 11/18/2015   Lethargy 11/18/2015   Exertional chest pain 09/30/2014   Abnormal cardiac  function test 09/29/2014   Chest pain with moderate risk for cardiac etiology 09/28/2014   HTN (hypertension) 09/28/2014   AKI (acute kidney injury)    Paresthesia 06/18/2014   Numbness and tingling of left arm and leg    Unstable angina (HCC) 05/24/2014   OSA on CPAP 05/24/2014   CAD (coronary artery disease) 11/25/2012   S/P laparoscopic appendectomy 06/04/2012   Chronic diastolic congestive heart failure (HCC) 01/09/2012   Mediastinal mass 01/09/2012   SOB (shortness of breath) 06/20/2011   Mediastinal abnormality 06/20/2011   Dyslipidemia 12/23/2009   GLAUCOMA 12/23/2009   ARTHRITIS 12/23/2009   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   Class 2 severe obesity with serious comorbidity and body mass index (BMI) of 38.0 to 38.9 in adult 09/13/2006   ANXIETY STATE NOS 09/13/2006   DISORDER, DEPRESSIVE NEC 09/13/2006   CARPAL TUNNEL SYNDROME, MILD 09/13/2006   Essential hypertension 09/13/2006   IBS 09/13/2006   DEGENERATION, LUMBAR/LUMBOSACRAL DISC 09/13/2006   SYMPTOM, SWELLING/MASS/LUMP IN CHEST 09/13/2006   PULMONARY EMBOLISM, HX OF 09/13/2006   PCP:  Arloa Jarvis, NP Pharmacy:   [facility] [address] [place] - [address] [place] [address] Phone: [phone] Fax: [phone]  [id number] - [place], [place] - [address] [place] [address] Phone: [phone] Fax: [phone]     Social Drivers of Health (SDOH) Social History: SDOH Screenings   Food Insecurity: Food Insecurity Present (03/27/2022)  Housing: Low Risk (03/27/2022)  Transportation Needs: No Transportation Needs (03/27/2022)  Utilities: Not At Risk (03/27/2022)  Alcohol Screen: Low Risk (03/27/2022)  Depression (PHQ2-9): Low Risk (05/05/2024)  Financial Resource Strain: High Risk (03/27/2022)  Tobacco Use: Medium Risk (05/15/2024)   SDOH Interventions:     Readmission Risk Interventions     No data to display

## 2024-05-18 NOTE — [place] FL2 (Signed)
 " [place]  MEDICAID FL2 LEVEL OF CARE FORM     IDENTIFICATION  Patient Name: [name] Birthdate: Oct 19, 1960 Sex: female Admission Date (Current Location): 05/15/2024  [facility] and [place] Number:  Producer, Television/film/video and Address:  [facility] [place], [address], [place], [place] [address]      Provider Number: [id number]  Attending Physician Name and Address:  Beuford Anes, MD  Relative Name and Phone Number:  Bobetta (Daughter)  [phone]    Current Level of Care: Hospital Recommended Level of Care: Skilled Nursing Facility Prior Approval Number:    Date Approved/Denied:   PASRR Number: [id number]  Discharge Plan: SNF    Current Diagnoses: Patient Active Problem List   Diagnosis Date Noted   Radiculopathy 05/15/2024   Class 3 severe obesity due to excess calories with serious comorbidity and body mass index (BMI) of 40.0 to 44.9 in adult [facility]) 12/13/2023   Morbid obesity (HCC) 09/09/2023   Lung nodules 07/15/2022   Heart failure (HCC) 03/25/2022   Chest pain 03/24/2022   Multiple thyroid  nodules 11/28/2021   Genetic testing 11/22/2021   Family history of breast cancer 11/03/2021   Family history of colon cancer 11/03/2021   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   Back pain 06/01/2021   Right thyroid  nodule 05/31/2021   At risk for impaired metabolic function 04/12/2020   Unilateral primary osteoarthritis, left hip    Hip arthritis 06/11/2018   Class 3 severe obesity with serious comorbidity and body mass index (BMI) of 40.0 to 44.9 in adult (HCC) 05/29/2018   Other fatigue 11/20/2017   SOBOE (shortness of breath on exertion) 11/20/2017   Type 2 diabetes mellitus with hyperglycemia, without long-term current use of insulin  (HCC) 11/20/2017   Vitamin D  deficiency 11/20/2017   Anxiety and depression 11/20/2017   Other hyperlipidemia 11/20/2017   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/04/2016    Presence of left artificial knee joint 09/20/2016   Arthritis of knee 08/22/2016   Cervical radiculopathy 07/26/2016   Cervical disc disorder with radiculopathy 07/26/2016   Right arm pain 06/29/2016   Cervicalgia 06/29/2016   Primary osteoarthritis of left knee 06/29/2016   Hypersomnia with sleep apnea 11/18/2015   Lethargy 11/18/2015   Exertional chest pain 09/30/2014   Abnormal cardiac function test 09/29/2014   Chest pain with moderate risk for cardiac etiology 09/28/2014   HTN (hypertension) 09/28/2014   AKI (acute kidney injury)    Paresthesia 06/18/2014   Numbness and tingling of left arm and leg    Unstable angina (HCC) 05/24/2014   OSA on CPAP 05/24/2014   CAD (coronary artery disease) 11/25/2012   S/P laparoscopic appendectomy 06/04/2012   Chronic diastolic congestive heart failure (HCC) 01/09/2012   Mediastinal mass 01/09/2012   SOB (shortness of breath) 06/20/2011   Mediastinal abnormality 06/20/2011   Dyslipidemia 12/23/2009   GLAUCOMA 12/23/2009   ARTHRITIS 12/23/2009   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   Class 2 severe obesity with serious comorbidity and body mass index (BMI) of 38.0 to 38.9 in adult 09/13/2006   ANXIETY STATE NOS 09/13/2006   DISORDER, DEPRESSIVE NEC 09/13/2006   CARPAL TUNNEL SYNDROME, MILD 09/13/2006   Essential hypertension 09/13/2006   IBS 09/13/2006   DEGENERATION, LUMBAR/LUMBOSACRAL DISC 09/13/2006   SYMPTOM, SWELLING/MASS/LUMP IN CHEST 09/13/2006   PULMONARY EMBOLISM, HX OF 09/13/2006    Orientation RESPIRATION BLADDER Height & Weight     Self, Time, Situation, Place  Normal Continent Weight: 192 lb (87.1  kg) Height:  4' 10 (147.3 cm)  BEHAVIORAL SYMPTOMS/MOOD NEUROLOGICAL BOWEL NUTRITION STATUS      Continent Diet (See DC Summary)  AMBULATORY STATUS COMMUNICATION OF NEEDS Skin   Limited Assist Verbally Other (Comment) (closed surgical incision on back)                       Personal Care Assistance Level of Assistance   Dressing, Bathing, Feeding Bathing Assistance: Limited assistance Feeding assistance: Independent Dressing Assistance: Limited assistance     Functional Limitations Info  Sight, Speech, Hearing Sight Info: Adequate Hearing Info: Adequate Speech Info: Adequate    SPECIAL CARE FACTORS FREQUENCY  PT (By licensed PT), OT (By licensed OT)     PT Frequency: 5x/week OT Frequency: 5x/week            Contractures Contractures Info: Not present    Additional Factors Info  Code Status, Allergies Code Status Info: Full Allergies Info: Shellfish; Hydrocodone -acetaminophen ; Gabapentin ; Latex; Sulfa Antibiotics           Current Medications (05/18/2024):  This is the current hospital active medication list Current Facility-Administered Medications  Medication Dose Route Frequency Provider Last Rate Last Admin   0.9 % NaCl with KCl 20 mEq/ L  infusion   Intravenous Continuous McKenzie, Kayla J, PA-C   Stopped at 05/18/24 9183   acetaminophen  (TYLENOL ) tablet 650 mg  650 mg Oral Q4H PRN McKenzie, Kayla J, PA-C       Or   acetaminophen  (TYLENOL ) suppository 650 mg  650 mg Rectal Q4H PRN McKenzie, Kayla J, PA-C       albuterol  (PROVENTIL ) (2.5 MG/3ML) 0.083% nebulizer solution 2.5 mg  2.5 mg Nebulization Q6H PRN McKenzie, Kayla J, PA-C       alum & mag hydroxide-simeth (MAALOX/MYLANTA) 200-200-20 MG/5ML suspension 30 mL  30 mL Oral Q6H PRN McKenzie, Kayla J, PA-C       atorvastatin  (LIPITOR) tablet 40 mg  40 mg Oral QHS McKenzie, Kayla J, PA-C   40 mg at 05/17/24 2208   bisacodyl  (DULCOLAX) EC tablet 5 mg  5 mg Oral Daily PRN McKenzie, Kayla J, PA-C       busPIRone  (BUSPAR ) tablet 30 mg  30 mg Oral BID McKenzie, Kayla J, PA-C   30 mg at 05/18/24 9185   carvedilol  (COREG ) tablet 3.125 mg  3.125 mg Oral BID McKenzie, Kayla J, PA-C   3.125 mg at 05/18/24 0815   docusate sodium  (COLACE) capsule 100 mg  100 mg Oral BID McKenzie, Kayla J, PA-C   100 mg at 05/18/24 9185   empagliflozin   (JARDIANCE ) tablet 10 mg  10 mg Oral Daily McKenzie, Kayla J, PA-C   10 mg at 05/18/24 9184   EPINEPHrine  (EPI-PEN) injection 0.3 mg  0.3 mg Intramuscular PRN McKenzie, Kayla J, PA-C       fluticasone  furoate-vilanterol (BREO ELLIPTA ) 100-25 MCG/ACT 1 puff  1 puff Inhalation Daily PRN McKenzie, Kayla J, PA-C       furosemide  (LASIX ) tablet 40 mg  40 mg Oral Daily PRN McKenzie, Kayla J, PA-C       hydrOXYzine  (ATARAX ) tablet 25 mg  25 mg Oral TID PRN Beuford Anes, MD       ipratropium-albuterol  (DUONEB) 0.5-2.5 (3) MG/3ML nebulizer solution 3 mL  3 mL Nebulization Q6H PRN McKenzie, Kayla J, PA-C       loperamide  (IMODIUM ) capsule 2 mg  2 mg Oral QID PRN McKenzie, Kayla J, PA-C  losartan  (COZAAR ) tablet 25 mg  25 mg Oral Daily McKenzie, Kayla J, PA-C   25 mg at 05/18/24 0815   menthol  (CEPACOL) lozenge 3 mg  1 lozenge Oral PRN McKenzie, Kayla J, PA-C       Or   phenol (CHLORASEPTIC) mouth spray 1 spray  1 spray Mouth/Throat PRN McKenzie, Kayla J, PA-C       metFORMIN  (GLUCOPHAGE -XR) 24 hr tablet 500 mg  500 mg Oral Q breakfast McKenzie, Kayla J, PA-C   500 mg at 05/18/24 9184   methocarbamol  (ROBAXIN ) tablet 500-1,000 mg  500-1,000 mg Oral Q6H PRN McKenzie, Kayla J, PA-C   1,000 mg at 05/17/24 2207   Or   methocarbamol  (ROBAXIN ) injection 500 mg  500 mg Intravenous Q6H PRN McKenzie, Kayla J, PA-C       morphine  (PF) 2 MG/ML injection 2 mg  2 mg Intravenous Q4H PRN McKenzie, Kayla J, PA-C   2 mg at 05/15/24 2026   nitroGLYCERIN  (NITROSTAT ) SL tablet 0.3 mg  0.3 mg Sublingual Q5 min PRN McKenzie, Kayla J, PA-C       nystatin  (MYCOSTATIN /NYSTOP ) topical powder 1 Application  1 Application Topical TID PRN McKenzie, Kayla J, PA-C       ondansetron  (ZOFRAN -ODT) disintegrating tablet 4 mg  4 mg Oral Q8H PRN McKenzie, Kayla J, PA-C       oxyCODONE -acetaminophen  (PERCOCET/ROXICET) 5-325 MG per tablet 1-2 tablet  1-2 tablet Oral Q4H PRN McKenzie, Kayla J, PA-C   2 tablet at 05/18/24 9276    pantoprazole  (PROTONIX ) EC tablet 40 mg  40 mg Oral Daily McKenzie, Kayla J, PA-C   40 mg at 05/18/24 9185   potassium chloride  (KLOR-CON  M) CR tablet 10 mEq  10 mEq Oral BID McKenzie, Kayla J, PA-C   10 mEq at 05/18/24 0813   Semaglutide  TABS 14 mg  14 mg Oral Daily Marten Larraine HERO, RPH   14 mg at 05/18/24 9388   senna-docusate (Senokot-S) tablet 1 tablet  1 tablet Oral QHS PRN McKenzie, Kayla J, PA-C       sertraline  (ZOLOFT ) tablet 25 mg  25 mg Oral Daily McKenzie, Kayla J, PA-C   25 mg at 05/18/24 9185   sodium chloride  flush (NS) 0.9 % injection 3 mL  3 mL Intravenous Q12H McKenzie, Kayla J, PA-C   3 mL at 05/18/24 9183   sodium chloride  flush (NS) 0.9 % injection 3 mL  3 mL Intravenous PRN McKenzie, Kayla J, PA-C       sodium phosphate  (FLEET) enema 1 enema  1 enema Rectal Once PRN McKenzie, Kayla J, PA-C       spironolactone  (ALDACTONE ) tablet 25 mg  25 mg Oral Daily McKenzie, Kayla J, PA-C   25 mg at 05/18/24 9185   valACYclovir  (VALTREX ) tablet 500 mg  500 mg Oral Daily PRN McKenzie, Kayla J, PA-C       Vitamin D  (Ergocalciferol ) (DRISDOL ) 1.25 MG (50000 UNIT) capsule 50,000 Units  50,000 Units Oral Q7 days McKenzie, Kayla J, PA-C       white petrolatum  (VASELINE) gel   Topical PRN Dasie Leonor CROME, MD   0.2 Application at 05/17/24 0543   zolpidem  (AMBIEN ) tablet 5 mg  5 mg Oral QHS PRN McKenzie, Kayla J, PA-C   5 mg at 05/17/24 2207     Discharge Medications: Please see discharge summary for a list of discharge medications.  Relevant Imaging Results:  Relevant Lab Results:   Additional Information SSN: [id number]  Jeoffrey CROME Moose, LCSWA     "

## 2024-05-18 NOTE — Discharge Instructions (Signed)
 "  [employer] -[facility]elessness Coordinated Entry Program. If you are experiencing homelessness in [place], [place] , your first point of contact should be Pensions Consultant. You can reach Coordinated Entry by calling ([phone]) ([phone] or by emailing [email or web address] [email or web address].  Community access points: [employer] [phone] [address], [place]) every Tuesday from 9am-10am. [facility] ([address]. [address] [place]) every Wednesday from 8am-9am.   -The [employer] 867-856-2724) offers several services to local families, as funding allows. The Emergency Assistance Program (EAP), which they administer, provides household goods, free food, clothing, and financial aid to people in need in the [place]  area. The EAP program does have some qualification, and counselors will interview clients for financial assistance by written referral only. Referrals need to be made by the Department of Social Services or by other EAP approved human services agencies or charities in the area.  -[facility] of [employer], which can be reached at [phone], offers emergency assistance programs for those in need of help, such as food, rent assistance, a soup kitchen, shelter, and clothing. They are based in [facility] [place]  but provide a number of services to those that qualify for assistance.   [facility] Department of Social Services may be able to offer temporary financial assistance and cash grants for paying rent and utilities, Help may be provided for local county residents who may be experiencing personal crisis when other resources, including government programs, are not available. Call [phone]  -[facility] [employer] is a [employer] agency, The organization can offer emergency assistance for paying rent, caremark rx, utilities, food,  household products and furniture. They offer extensive emergency and transitional housing for families, children and single women, and also run a Boy's and [employer]. Thrift Shops, Secondary School Teacher, and other aid offered too. [address] [place], [place]  [address], [phone]  -[place] Low Income Energy Assistance Program -- This is offered for [facility] families. The federal government created [employer] Program provides a one-time cash grant payment to help eligible low-income families pay their electric and heating bills. [address] [place], [place]  [address], [phone]  -[place] Emergency Assistance -- A program offers emergency utility and rent funds for greater [place] area residents. The program can also provide counseling and referrals to charities and government programs. Also provides food and a free meal program that serves lunch Mondays - Saturdays and dinner seven days per week to individuals in the community. [address], [place], [place]  [address], [phone]  -[employer] - Offers affordable apartment and housing communities across      [place] and [place]. The low income and seniors can access public housing, rental assistance to qualified applicants, and apply for the section 8 rent subsidy program. Other programs include Chiropractor and Engineer, Maintenance. [address] [place], [place]  [address], dial [phone].  -The [facility] provides transitional housing to veterans and the disabled. Clients will also access other services too, including assistance in applying for Disability, life skills classes, case management, and assistance in finding permanent housing. [address] [place], [place]  [address], call [phone]  -[facility] [place] Transitional Housing through [employer] is for  people who were just evicted or that are formerly homeless. The non-profit will also help then gain self-sufficiency, find a home or apartment  to live in, and also provides information on rent assistance when needed. Phone [phone]  -The [facility] helps low income, elderly, or disabled residents in seven counties in the [place] ([place], [place], [place], [place], [place], [place], [place], and [place]) save energy and reduce their utility bills by improving energy efficiency. Phone [phone].  -[employer] is located in the [place] [facility] in the [employer], [address], [place], [place] [address]. Parking is in the rear of the building. Phone: [phone]   General Email: info@gsohc .org  [facility] provides free housing counseling assistance in locating affordable rental housing or housing with support services for families and individuals in crisis and the chronically homeless. We provide potential resources for other housing needs like utilities. Our trained counselors also work with clients on budgeting and financial literacy in effort to empower them to take control of their financial situations. [employer] collaborates with homeless service providers and other stakeholders as part of the [employer] COC (Continuum of Care). The (COC) is a regional/local planning body that coordinates housing and services funding for homeless families and individuals. The role of [facility] in the COC is through housing counseling to work with people we serve on diversion strategies for those that are at imminent risk of becoming homeless. We also work with the Coordinated Assessment/Entry Specialist who attempts to find temporary solutions and/or connects the people to Housing First, Rapid Re-housing or transitional housing programs. Our Homelessness Prevention Housing Counselors meet  with clients on business days (Monday-Fridays, except scheduled holidays) from 8:30 am to 4:30 pm.  Legal assistance for evictions, foreclosure, and more -If you need free legal advice on civil issues, such as foreclosures, evictions, electronics engineer, government programs, domestic issues and more, Armed Forces Operational Officer Aid of [place]  [facility]) is a associate professor firm that provides free legal services and counsel to lower income people, seniors, disabled, and others, The goal is to ensure everyone has access to justice and fair representation. Call them at [phone].  [facility] for Housing and Community Studies can provide info about obtaining legal assistance with evictions. Phone ([phone]. Data Processing Manager  The [employer], [employer]. offers job and dispensing optician. Resources are focused on helping students obtain the skills and experiences that are necessary to compete in today's challenging and tight job market. The non-profit faith-based community action agency offers internship trainings as well as classroom instruction. Classes are tailored to meet the needs of people in the [facility] region. [place], [place] [address], [phone] Foreclosure Prevention/Debt Services Family Services of the [employer] Credit Counseling Service inludes debt and foreclosure prevention programs for local families. This includes money management, financial advice, budget review and development of a written action plan with a pensions consultant to help solve specific individual financial problems. In addition, housing and mortgage counselors can also provide pre- and post-purchase homeownership counseling, default resolution counseling (to prevent foreclosure) and reverse mortgage counseling. A Debt Management Program allows people and families with a high level of credit card or medical debt to consolidate and repay consumer debt and loans to  creditors and rebuild positive credit ratings and scores. Contact ([phone]) D7650557. Community Clinics in [place] -Health Department [facility]: [address] [place], [place], [address]. [phone].  -Health Department [place] [facility]: [address], [place], [address]. [phone].  -[facility] offers medical care through a group of doctors, pharmacies  and other healthcare related agencies that offer services for low income, uninsured adults in [place]. Also offers adult Dental care and assistance with applying for an [employer]. Call [phone].   [facility]. This center provides low-cost health care to those without health insurance. Services offered include an onsite pharmacy. Phone [phone]. [address], [place].  -Medication Assistance Program serves as a link between pharmaceutical companies and patients to provide low cost or free prescription medications. This service is available for residents who meet certain income restrictions and have no insurance coverage. PLEASE CALL [phone]) [phone] [place]) OR ([phone] ([place])  -One Step Further: Materials Engineer, The [employer] Support & Nutrition Program, [place]. Call [phone]/ [phone].  Food Emergency Planning/management Officer -[facility]-Food Bank: [address], [place] [address]. Phone [phone]  -[facility]: [address], [place], [place] [address]. [phone].  -[place] Food Finder: https://findfood.bargaincontractor.si  FLEEING VI[place]ENCE  -Family Services of the [place]- 24/7 Crisis line [phone]) -[facility] Family Justice Centers: (430) [phone]) 641-SAFE (260)[phone])  Transportation  -[facility] for an application. No fee for people over the age of 34. P: ([phone]. Address: [address], [place], [place] [address]  -Senior Wheels Transportation-Age 88 and over. Limit of 1 ride per week for ambulatory participants. Limit 1 ride per month for non-ambulatory participants needing wheelchair transportation. Contact: (336) [phone] ([place]), [phone] [facility]).  -Access [place]: Available for people with disabilities who are unable to use fixed-route bus service. Fee applies. Contact: [phone] (For application requests)/(336) [phone] (Customer service)/(336) ([phone] (I-ride reservation line)/(336) [phone] (Access [place] reservation line)   Building Services Engineer.org  Homeless Day Center  -[facility])   Day Center: M-F 8a-3p [address]  [place], [place] [address] [phone] Services include: laundry, barbering, support groups, case management, phone & computer access, showers, AA/NA mtgs, mental health/substance abuse nurse, job skills class, disability information, VA assistance, spiritual classes, etc.        HOMELESS SHELTERS [employer] Shelter at [employer]- Call [phone] ext. 347 or ext. 336. Located at [address], [place], [place] [address]  [facility] Mens Shelter- Call [phone]. Located at [address] [place] [address] [place] [address].  [facility]- [employer]. Call [phone]. Office located at [address], [place] [address].  [facility] Family Housing through [place] [phone].  [facility] Family [facility]- Call [phone]. Located at [address] [place], [place], [place] [address].  [facility] at the Inn-For Pregnant mothers. Call [phone]. Located at [address] [place], [address].  [facility]-For men in [place]. Call [phone].  Home of [employer] for [employer] [phone]. Office located at [address] [place], [address].  [employer] be agreeable to help with chores. Call [phone] ext. 5000.  Men's: [address] [place], [place] [address]. Women's: [facility]  [address] [place], [place] [address]   Crisis Services [facility] Mobile Crisis Management- [phone]  [facility] [address] [place], [place] [address]. Phone: [phone]   *[place] 2-1-1 is another useful way to locate resources in the community. Visit shedsizes.ch to find service information online. If you need additional assistance, 2-1-1 Referral Specialists are available 24 hours a day, every day by dialing 2-1-1 or  [phone] from any phone. The call is free, confidential, and available in any language. "

## 2024-05-18 NOTE — Progress Notes (Signed)
 Physical Therapy Treatment Patient Details Name: [name] MRN: [id number] DOB: 07-15-1960 Today's Date: 05/18/2024   History of Present Illness 63 yo F adm 1/22 TLIF L4-5 PMH arthritis, anxiety, CHF, clotting disorder, CAD, depression, DM, HLD, HTN, IBS, mediastinal mass, PE, RA, sleep apnea on CPAP,    PT Comments  Pt is limited by fatigue and pain, reporting low back spasms which radiate into BLE. Pt demonstrates some improvement in transfer quality compared to previous PT session, but ambulates for reduced distance (of note the pt did recently finish bathing with assistance of nurse tech prior to PT treatment). Pt remains unable to don brace unassisted. Pt will benefit from continued frequent mobilization in an effort to improve activity tolerance and to restore independence. Patient will benefit from continued inpatient follow up therapy, <3 hours/day.   If plan is discharge home, recommend the following: A little help with walking and/or transfers;A little help with bathing/dressing/bathroom;Assistance with cooking/housework;Help with stairs or ramp for entrance   Can travel by private vehicle     No  Equipment Recommendations  Rolling walker (2 wheels);BSC/3in1    Recommendations for Other Services       Precautions / Restrictions Precautions Precautions: Back Precaution Booklet Issued: Yes (comment) Recall of Precautions/Restrictions: Intact Required Braces or Orthoses: Spinal Brace Spinal Brace: Thoracolumbosacral orthotic;Applied in sitting position (requires modA for brace application) Restrictions Weight Bearing Restrictions Per Provider Order: No     Mobility  Bed Mobility                    Transfers Overall transfer level: Needs assistance Equipment used: Rolling walker (2 wheels) Transfers: Sit to/from Stand Sit to Stand: Min assist                Ambulation/Gait Ambulation/Gait assistance: Contact guard assist Gait Distance (Feet):  50 Feet Assistive device: Rolling walker (2 wheels) Gait Pattern/deviations: Step-to pattern Gait velocity: reduced Gait velocity interpretation: <1.31 ft/sec, indicative of household ambulator   General Gait Details: slowed step-to gait, shot standing break where pt leans posteriorly on wall in the hallway prior to walking back into room   Stairs             Wheelchair Mobility     Tilt Bed    Modified Rankin (Stroke Patients Only)       Balance Overall balance assessment: Needs assistance Sitting-balance support: No upper extremity supported, Feet supported Sitting balance-Leahy Scale: Fair     Standing balance support: Bilateral upper extremity supported, Reliant on assistive device for balance Standing balance-Leahy Scale: Poor                              Communication Communication Communication: No apparent difficulties  Cognition Arousal: Alert Behavior During Therapy: WFL for tasks assessed/performed   PT - Cognitive impairments: No apparent impairments                         Following commands: Intact      Cueing Cueing Techniques: Verbal cues  Exercises      General Comments General comments (skin integrity, edema, etc.): VSS on RA      Pertinent Vitals/Pain Pain Assessment Pain Assessment: 0-10 Pain Score: 10-Worst pain ever Pain Location: back, radiating into LE Pain Descriptors / Indicators: Spasm Pain Intervention(s): Limited activity within patient's tolerance    Home Living  Prior Function            PT Goals (current goals can now be found in the care plan section) Acute Rehab PT Goals Patient Stated Goal: able to be independent before I get home Progress towards PT goals: Progressing toward goals    Frequency    Min 2X/week      PT Plan      Co-evaluation              AM-PAC PT 6 Clicks Mobility   Outcome Measure  Help needed turning from your  back to your side while in a flat bed without using bedrails?: A Little Help needed moving from lying on your back to sitting on the side of a flat bed without using bedrails?: A Lot Help needed moving to and from a bed to a chair (including a wheelchair)?: A Little Help needed standing up from a chair using your arms (e.g., wheelchair or bedside chair)?: A Little Help needed to walk in hospital room?: A Little Help needed climbing 3-5 steps with a railing? : Total 6 Click Score: 15    End of Session Equipment Utilized During Treatment: Gait belt;Back brace Activity Tolerance: Patient limited by fatigue Patient left: in chair;with call bell/phone within reach;with chair alarm set Nurse Communication: Mobility status PT Visit Diagnosis: Other abnormalities of gait and mobility (R26.89);Difficulty in walking, not elsewhere classified (R26.2);Pain Pain - Right/Left: Right     Time: 8969-8951 PT Time Calculation (min) (ACUTE ONLY): 18 min  Charges:    $Gait Training: 8-22 mins PT General Charges $$ ACUTE PT VISIT: 1 Visit                     [name], PT, DPT Acute Rehabilitation Office [phone]    [name] 05/18/2024, 11:04 AM

## 2024-05-19 DIAGNOSIS — M48061 Spinal stenosis, lumbar region without neurogenic claudication: Secondary | ICD-10-CM | POA: Diagnosis not present

## 2024-05-19 NOTE — TOC Progression Note (Signed)
 Transition of Care [facility]) - Progression Note    Patient Details  Name: [name] MRN: [id number] Date of Birth: 26-Sep-1960  [facility]) CM/SW Contact  Montie LOISE Louder, [place] Phone Number: 05/19/2024, 2:28 PM  Clinical Narrative:     Ms [name] is approved for SNF/ Emmalene Hertz 1/26-1/28 Mayme Barrows PI#[id number]   CSW sent message to [facility] of anticipated d/c tomorrow if patient is stable.  CSW spoke with patient by phone- provided update  TOC will continue to follow and assist with discharge planning.  Montie Louder, MSW, LCSW Clinical Social Worker    Expected Discharge Plan: Skilled Nursing Facility Barriers to Discharge: Continued Medical Work up               Expected Discharge Plan and Services       Living arrangements for the past 2 months: Single Family Home                                       Social Drivers of Health (SDOH) Interventions SDOH Screenings   Food Insecurity: No Food Insecurity (05/18/2024)  Housing: High Risk (05/18/2024)  Transportation Needs: No Transportation Needs (05/18/2024)  Utilities: At Risk (05/18/2024)  Alcohol Screen: Low Risk (03/27/2022)  Depression (PHQ2-9): Low Risk (05/05/2024)  Financial Resource Strain: High Risk (03/27/2022)  Tobacco Use: Medium Risk (05/15/2024)    Readmission Risk Interventions     No data to display

## 2024-05-19 NOTE — Plan of Care (Signed)

## 2024-05-19 NOTE — Progress Notes (Signed)
 PO Day 4 S/P L4-5 decompression and fusion by Dr Beuford Sermon with pt who reports doing overall OK. She states she has been up with PT, though with slow progress per the notes. Pt sitting up in chair currently and reports switching between bed and chair frequently. Awaiting SNF, had chosen [facility] and awaiting insurance approval. Reports moderate LBP. Per nurse pt doing well just slow progress with mobility and brace application.   -Continue PO px meds and robaxin  PRN -Encouraged frequent movement/walking and change of position -PT/OT until d/c to SNF -D/C meds printed and in chart  -D/C to SNF when bed available  -Can remove bulky outter dressing PO day 5, no need to recover steri-strips, shower normally   Ileana Clara, PA-C

## 2024-05-19 NOTE — Plan of Care (Signed)
 " Problem: Education: Goal: Knowledge of General Education information will improve Description: Including pain rating scale, medication(s)/side effects and non-pharmacologic comfort measures Outcome: Progressing   Problem: Health Behavior/Discharge Planning: Goal: Ability to manage health-related needs will improve Outcome: Progressing   Problem: Clinical Measurements: Goal: Ability to maintain clinical measurements within normal limits will improve Outcome: Progressing Goal: Will remain free from infection Outcome: Progressing Goal: Diagnostic test results will improve Outcome: Progressing Goal: Respiratory complications will improve Outcome: Progressing Goal: Cardiovascular complication will be avoided Outcome: Progressing   Problem: Activity: Goal: Risk for activity intolerance will decrease Outcome: Progressing   Problem: Nutrition: Goal: Adequate nutrition will be maintained Outcome: Progressing   Problem: Coping: Goal: Level of anxiety will decrease Outcome: Progressing   Problem: Elimination: Goal: Will not experience complications related to bowel motility Outcome: Progressing Goal: Will not experience complications related to urinary retention Outcome: Progressing   Problem: Pain Managment: Goal: General experience of comfort will improve and/or be controlled Outcome: Progressing   Problem: Safety: Goal: Ability to remain free from injury will improve Outcome: Progressing   Problem: Skin Integrity: Goal: Risk for impaired skin integrity will decrease Outcome: Progressing   Problem: Education: Goal: Knowledge of the prescribed therapeutic regimen will improve Outcome: Progressing   Problem: Bowel/Gastric: Goal: Gastrointestinal status for postoperative course will improve Outcome: Progressing   Problem: Cardiac: Goal: Ability to maintain an adequate cardiac output Outcome: Progressing Goal: Will show no evidence of cardiac arrhythmias Outcome:  Progressing   Problem: Nutritional: Goal: Will attain and maintain optimal nutritional status Outcome: Progressing   Problem: Neurological: Goal: Will regain or maintain usual level of consciousness Outcome: Progressing   Problem: Clinical Measurements: Goal: Ability to maintain clinical measurements within normal limits Outcome: Progressing Goal: Postoperative complications will be avoided or minimized Outcome: Progressing   Problem: Respiratory: Goal: Will regain and/or maintain adequate ventilation Outcome: Progressing Goal: Respiratory status will improve Outcome: Progressing   Problem: Skin Integrity: Goal: Demonstrates signs of wound healing without infection Outcome: Progressing   Problem: Urinary Elimination: Goal: Will remain free from infection Outcome: Progressing Goal: Ability to achieve and maintain adequate urine output Outcome: Progressing   Problem: Education: Goal: Ability to verbalize activity precautions or restrictions will improve Outcome: Progressing Goal: Knowledge of the prescribed therapeutic regimen will improve Outcome: Progressing Goal: Understanding of discharge needs will improve Outcome: Progressing   Problem: Education: Goal: Knowledge of General Education information will improve Description: Including pain rating scale, medication(s)/side effects and non-pharmacologic comfort measures Outcome: Progressing   Problem: Health Behavior/Discharge Planning: Goal: Ability to manage health-related needs will improve Outcome: Progressing   Problem: Clinical Measurements: Goal: Ability to maintain clinical measurements within normal limits will improve Outcome: Progressing Goal: Will remain free from infection Outcome: Progressing Goal: Diagnostic test results will improve Outcome: Progressing Goal: Respiratory complications will improve Outcome: Progressing Goal: Cardiovascular complication will be avoided Outcome: Progressing    Problem: Activity: Goal: Risk for activity intolerance will decrease Outcome: Progressing   Problem: Nutrition: Goal: Adequate nutrition will be maintained Outcome: Progressing   Problem: Coping: Goal: Level of anxiety will decrease Outcome: Progressing   Problem: Elimination: Goal: Will not experience complications related to bowel motility Outcome: Progressing Goal: Will not experience complications related to urinary retention Outcome: Progressing   Problem: Pain Managment: Goal: General experience of comfort will improve and/or be controlled Outcome: Progressing   Problem: Safety: Goal: Ability to remain free from injury will improve Outcome: Progressing   Problem: Skin Integrity: Goal: Risk for impaired skin integrity  will decrease Outcome: Progressing   "

## 2024-05-19 NOTE — TOC Progression Note (Signed)
 Transition of Care [facility]) - Progression Note    Patient Details  Name: [name] MRN: [id number] Date of Birth: July 06, 1960  Transition of Care [facility]) CM/SW Contact  Montie LOISE Louder, [place] Phone Number: 05/19/2024, 9:16 AM  Clinical Narrative:     Per chart review- patient wants SNF and is interested in [facility]- sent request to SNF to review referral- waiting on response.  Expected Discharge Plan: Skilled Nursing Facility Barriers to Discharge: Continued Medical Work up, SNF Pending bed offer, Insurance Authorization               Expected Discharge Plan and Services       Living arrangements for the past 2 months: Single Family Home                                       Social Drivers of Health (SDOH) Interventions SDOH Screenings   Food Insecurity: No Food Insecurity (05/18/2024)  Housing: High Risk (05/18/2024)  Transportation Needs: No Transportation Needs (05/18/2024)  Utilities: At Risk (05/18/2024)  Alcohol Screen: Low Risk (03/27/2022)  Depression (PHQ2-9): Low Risk (05/05/2024)  Financial Resource Strain: High Risk (03/27/2022)  Tobacco Use: Medium Risk (05/15/2024)    Readmission Risk Interventions     No data to display

## 2024-05-20 ENCOUNTER — Ambulatory Visit: Admitting: Adult Health

## 2024-05-20 DIAGNOSIS — M48061 Spinal stenosis, lumbar region without neurogenic claudication: Secondary | ICD-10-CM | POA: Diagnosis not present

## 2024-05-20 NOTE — Plan of Care (Signed)
 [name] will come and pick up her home medications from the pharmacy. Problem: Education: Goal: Knowledge of General Education information will improve Description: Including pain rating scale, medication(s)/side effects and non-pharmacologic comfort measures 05/20/2024 1642 by Charlott Pott, RN Outcome: Completed/Met 05/20/2024 1641 by Charlott Pott, RN Outcome: Progressing   Problem: Health Behavior/Discharge Planning: Goal: Ability to manage health-related needs will improve 05/20/2024 1642 by Charlott Pott, RN Outcome: Completed/Met 05/20/2024 1641 by Charlott Pott, RN Outcome: Progressing   Problem: Clinical Measurements: Goal: Ability to maintain clinical measurements within normal limits will improve 05/20/2024 1642 by Charlott Pott, RN Outcome: Completed/Met 05/20/2024 1641 by Charlott Pott, RN Outcome: Progressing Goal: Will remain free from infection 05/20/2024 1642 by Charlott Pott, RN Outcome: Completed/Met 05/20/2024 1641 by Charlott Pott, RN Outcome: Progressing Goal: Diagnostic test results will improve 05/20/2024 1642 by Charlott Pott, RN Outcome: Completed/Met 05/20/2024 1641 by Charlott Pott, RN Outcome: Progressing Goal: Respiratory complications will improve 05/20/2024 1642 by Charlott Pott, RN Outcome: Completed/Met 05/20/2024 1641 by Charlott Pott, RN Outcome: Progressing Goal: Cardiovascular complication will be avoided 05/20/2024 1642 by Charlott Pott, RN Outcome: Completed/Met 05/20/2024 1641 by Charlott Pott, RN Outcome: Progressing   Problem: Activity: Goal: Risk for activity intolerance will decrease 05/20/2024 1642 by Charlott Pott, RN Outcome: Completed/Met 05/20/2024 1641 by Charlott Pott, RN Outcome: Progressing   Problem: Nutrition: Goal: Adequate nutrition will be maintained 05/20/2024 1642 by Charlott Pott, RN Outcome:  Completed/Met 05/20/2024 1641 by Charlott Pott, RN Outcome: Progressing   Problem: Coping: Goal: Level of anxiety will decrease 05/20/2024 1642 by Charlott Pott, RN Outcome: Completed/Met 05/20/2024 1641 by Charlott Pott, RN Outcome: Progressing   Problem: Elimination: Goal: Will not experience complications related to bowel motility 05/20/2024 1642 by Charlott Pott, RN Outcome: Completed/Met 05/20/2024 1641 by Charlott Pott, RN Outcome: Progressing Goal: Will not experience complications related to urinary retention 05/20/2024 1642 by Charlott Pott, RN Outcome: Completed/Met 05/20/2024 1641 by Charlott Pott, RN Outcome: Progressing   Problem: Pain Managment: Goal: General experience of comfort will improve and/or be controlled 05/20/2024 1642 by Charlott Pott, RN Outcome: Completed/Met 05/20/2024 1641 by Charlott Pott, RN Outcome: Progressing   Problem: Safety: Goal: Ability to remain free from injury will improve 05/20/2024 1642 by Charlott Pott, RN Outcome: Completed/Met 05/20/2024 1641 by Charlott Pott, RN Outcome: Progressing   Problem: Skin Integrity: Goal: Risk for impaired skin integrity will decrease 05/20/2024 1642 by Charlott Pott, RN Outcome: Completed/Met 05/20/2024 1641 by Charlott Pott, RN Outcome: Progressing   Problem: Education: Goal: Knowledge of the prescribed therapeutic regimen will improve 05/20/2024 1642 by Charlott Pott, RN Outcome: Completed/Met 05/20/2024 1641 by Charlott Pott, RN Outcome: Progressing   Problem: Bowel/Gastric: Goal: Gastrointestinal status for postoperative course will improve 05/20/2024 1642 by Charlott Pott, RN Outcome: Completed/Met 05/20/2024 1641 by Charlott Pott, RN Outcome: Progressing   Problem: Cardiac: Goal: Ability to maintain an adequate cardiac output 05/20/2024 1642 by Charlott Pott, RN Outcome:  Completed/Met 05/20/2024 1641 by Charlott Pott, RN Outcome: Progressing Goal: Will show no evidence of cardiac arrhythmias 05/20/2024 1642 by Charlott Pott, RN Outcome: Completed/Met 05/20/2024 1641 by Charlott Pott, RN Outcome: Progressing   Problem: Nutritional: Goal: Will attain and maintain optimal nutritional status 05/20/2024 1642 by Charlott Pott, RN Outcome: Completed/Met 05/20/2024 1641 by Charlott Pott, RN Outcome: Progressing   Problem: Neurological: Goal: Will regain or maintain usual level of consciousness 05/20/2024 1642 by Charlott Pott, RN Outcome: Completed/Met 05/20/2024 1641 by Charlott Pott, RN Outcome: Progressing   Problem: Clinical Measurements: Goal: Ability to maintain clinical measurements  within normal limits 05/20/2024 1642 by Charlott Pott, RN Outcome: Completed/Met 05/20/2024 1641 by Charlott Pott, RN Outcome: Progressing Goal: Postoperative complications will be avoided or minimized 05/20/2024 1642 by Charlott Pott, RN Outcome: Completed/Met 05/20/2024 1641 by Charlott Pott, RN Outcome: Progressing   Problem: Respiratory: Goal: Will regain and/or maintain adequate ventilation 05/20/2024 1642 by Charlott Pott, RN Outcome: Completed/Met 05/20/2024 1641 by Charlott Pott, RN Outcome: Progressing Goal: Respiratory status will improve 05/20/2024 1642 by Charlott Pott, RN Outcome: Completed/Met 05/20/2024 1641 by Charlott Pott, RN Outcome: Progressing   Problem: Skin Integrity: Goal: Demonstrates signs of wound healing without infection 05/20/2024 1642 by Charlott Pott, RN Outcome: Completed/Met 05/20/2024 1641 by Charlott Pott, RN Outcome: Progressing   Problem: Urinary Elimination: Goal: Will remain free from infection 05/20/2024 1642 by Charlott Pott, RN Outcome: Completed/Met 05/20/2024 1641 by Charlott Pott, RN Outcome:  Progressing Goal: Ability to achieve and maintain adequate urine output 05/20/2024 1642 by Charlott Pott, RN Outcome: Completed/Met 05/20/2024 1641 by Charlott Pott, RN Outcome: Progressing   Problem: Education: Goal: Ability to verbalize activity precautions or restrictions will improve 05/20/2024 1642 by Charlott Pott, RN Outcome: Completed/Met 05/20/2024 1641 by Charlott Pott, RN Outcome: Progressing Goal: Knowledge of the prescribed therapeutic regimen will improve 05/20/2024 1642 by Charlott Pott, RN Outcome: Completed/Met 05/20/2024 1641 by Charlott Pott, RN Outcome: Progressing Goal: Understanding of discharge needs will improve 05/20/2024 1642 by Charlott Pott, RN Outcome: Completed/Met 05/20/2024 1641 by Charlott Pott, RN Outcome: Progressing   Problem: Activity: Goal: Ability to avoid complications of mobility impairment will improve Outcome: Completed/Met Goal: Ability to tolerate increased activity will improve Outcome: Completed/Met Goal: Will remain free from falls Outcome: Completed/Met   Problem: Bowel/Gastric: Goal: Gastrointestinal status for postoperative course will improve Outcome: Completed/Met   Problem: Clinical Measurements: Goal: Ability to maintain clinical measurements within normal limits will improve Outcome: Completed/Met Goal: Postoperative complications will be avoided or minimized Outcome: Completed/Met Goal: Diagnostic test results will improve Outcome: Completed/Met   Problem: Pain Management: Goal: Pain level will decrease Outcome: Completed/Met   Problem: Skin Integrity: Goal: Will show signs of wound healing Outcome: Completed/Met   Problem: Health Behavior/Discharge Planning: Goal: Identification of resources available to assist in meeting health care needs will improve Outcome: Completed/Met   Problem: Bladder/Genitourinary: Goal: Urinary functional status for postoperative course  will improve Outcome: Completed/Met   Problem: Education: Goal: Knowledge of General Education information will improve Description: Including pain rating scale, medication(s)/side effects and non-pharmacologic comfort measures 05/20/2024 1642 by Charlott Pott, RN Outcome: Completed/Met 05/20/2024 1641 by Charlott Pott, RN Outcome: Progressing   Problem: Health Behavior/Discharge Planning: Goal: Ability to manage health-related needs will improve 05/20/2024 1642 by Charlott Pott, RN Outcome: Completed/Met 05/20/2024 1641 by Charlott Pott, RN Outcome: Progressing   Problem: Clinical Measurements: Goal: Ability to maintain clinical measurements within normal limits will improve 05/20/2024 1642 by Charlott Pott, RN Outcome: Completed/Met 05/20/2024 1641 by Charlott Pott, RN Outcome: Progressing Goal: Will remain free from infection 05/20/2024 1642 by Charlott Pott, RN Outcome: Completed/Met 05/20/2024 1641 by Charlott Pott, RN Outcome: Progressing Goal: Diagnostic test results will improve 05/20/2024 1642 by Charlott Pott, RN Outcome: Completed/Met 05/20/2024 1641 by Charlott Pott, RN Outcome: Progressing Goal: Respiratory complications will improve 05/20/2024 1642 by Charlott Pott, RN Outcome: Completed/Met 05/20/2024 1641 by Charlott Pott, RN Outcome: Progressing Goal: Cardiovascular complication will be avoided 05/20/2024 1642 by Charlott Pott, RN Outcome: Completed/Met 05/20/2024 1641 by Charlott Pott, RN Outcome: Progressing   Problem: Activity: Goal: Risk for activity intolerance will decrease 05/20/2024 1642  by Charlott Pott, RN Outcome: Completed/Met 05/20/2024 1641 by Charlott Pott, RN Outcome: Progressing   Problem: Nutrition: Goal: Adequate nutrition will be maintained 05/20/2024 1642 by Charlott Pott, RN Outcome: Completed/Met 05/20/2024 1641 by Charlott Pott,  RN Outcome: Progressing   Problem: Coping: Goal: Level of anxiety will decrease 05/20/2024 1642 by Charlott Pott, RN Outcome: Completed/Met 05/20/2024 1641 by Charlott Pott, RN Outcome: Progressing   Problem: Elimination: Goal: Will not experience complications related to bowel motility 05/20/2024 1642 by Charlott Pott, RN Outcome: Completed/Met 05/20/2024 1641 by Charlott Pott, RN Outcome: Progressing Goal: Will not experience complications related to urinary retention 05/20/2024 1642 by Charlott Pott, RN Outcome: Completed/Met 05/20/2024 1641 by Charlott Pott, RN Outcome: Progressing   Problem: Pain Managment: Goal: General experience of comfort will improve and/or be controlled 05/20/2024 1642 by Charlott Pott, RN Outcome: Completed/Met 05/20/2024 1641 by Charlott Pott, RN Outcome: Progressing   Problem: Safety: Goal: Ability to remain free from injury will improve 05/20/2024 1642 by Charlott Pott, RN Outcome: Completed/Met 05/20/2024 1641 by Charlott Pott, RN Outcome: Progressing   Problem: Skin Integrity: Goal: Risk for impaired skin integrity will decrease 05/20/2024 1642 by Charlott Pott, RN Outcome: Completed/Met 05/20/2024 1641 by Charlott Pott, RN Outcome: Progressing

## 2024-05-20 NOTE — TOC Transition Note (Signed)
 Transition of Care [facility]) - Discharge Note   Patient Details  Name: [name] MRN: [id number] Date of Birth: 1960-09-02  Transition of Care [facility]) CM/SW Contact:  Montie LOISE Louder, LCSW Phone Number: 05/20/2024, 3:29 PM   Clinical Narrative:     Patient will Discharge to: [facility]  Discharge Date: 05/20/2024 Family Notified:   Transport By: ROME  Per MD patient is ready for discharge. RN, patient, and facility notified of discharge. Discharge Summary sent to facility. RN given number for [phone]. Ambulance transport requested for patient.   Clinical Social Worker signing off.  Montie Louder, MSW, LCSW Clinical Social Worker     Final next level of care: Skilled Nursing Facility Barriers to Discharge: Barriers Resolved   Patient Goals and CMS Choice Patient states their goals for this hospitalization and ongoing recovery are:: SNF          Discharge Placement              Patient chooses bed at: [facility] Patient to be transferred to facility by: [facility]   Patient and family notified of of transfer: 05/20/24  Discharge Plan and Services Additional resources added to the After Visit Summary for                                       Social Drivers of Health (SDOH) Interventions SDOH Screenings   Food Insecurity: No Food Insecurity (05/18/2024)  Housing: High Risk (05/18/2024)  Transportation Needs: No Transportation Needs (05/18/2024)  Utilities: At Risk (05/18/2024)  Alcohol Screen: Low Risk (03/27/2022)  Depression (PHQ2-9): Low Risk (05/05/2024)  Financial Resource Strain: High Risk (03/27/2022)  Tobacco Use: Medium Risk (05/15/2024)     Readmission Risk Interventions     No data to display

## 2024-05-20 NOTE — Discharge Summary (Signed)
 "   Patient ID: [name] MRN: [id number] DOB/AGE: [birth year] 65 y.o.  Admit date: 05/15/2024 Discharge date: 05/20/2024  Admission Diagnoses:  Principal Problem:   Radiculopathy   Discharge Diagnoses:  Same  Past Medical History:  Diagnosis Date   Allergy    Anginal pain    a. NL cath in 2008;  b. Myoview  03/2011: dec uptake along mid anterior wall on stress imaging -> ? attenuation vs. ischemia, EF 65%;  c. Echo 04/2011: EF 55-60%, no RWMA, Gr 2 dd   Anxiety    Arthritis    Asthma    Back pain    Bone cancer (HCC)    pt states she was told this but it didn't show up on further testing   Cancer (HCC)    Chest pain    CHF (congestive heart failure) (HCC)    Clotting disorder    Constipation    Coronary artery disease    Depression    Diabetes mellitus    Drug use    Dyspnea    Dysrhythmia    Frequent urination    GERD (gastroesophageal reflux disease)    Glaucoma    Goiter    thyroid    History of stomach ulcers    HLD (hyperlipidemia)    Hypertension    IBS (irritable bowel syndrome)    Joint pain    Joint pain    Lactose intolerance    Leg edema    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 2008 -> coumadin x 6 mos.   Rheumatoid arthritis (HCC)    Sleep apnea    on CPAP 02/2018   SOB (shortness of breath)    Thyroid  disease    TIA (transient ischemic attack)    Urinary urgency    Vitamin D  deficiency     Surgeries: Procedures: TRANSFORAMINAL LUMBAR INTERBODY FUSION (TLIF) WITH PEDICLE SCREW FIXATION 1 LEVEL on 05/15/2024   Consultants: None  Discharged Condition: Improved  Hospital Course: [name] is an 64 y.o. female who was admitted 05/15/2024 for operative treatment of Radiculopathy. Patient has severe unremitting pain that affects sleep, daily activities, and  work/hobbies. After pre-op clearance the patient was taken to the operating room on 05/15/2024 and underwent  Procedures: TRANSFORAMINAL LUMBAR INTERBODY FUSION (TLIF) WITH PEDICLE SCREW FIXATION 1 LEVEL.    Patient was given perioperative antibiotics:  Anti-infectives (From admission, onward)    Start     Dose/Rate Route Frequency Ordered Stop   05/15/24 1445  ceFAZolin  (ANCEF ) IVPB 2g/100 mL premix        2 g 200 mL/hr over 30 Minutes Intravenous Every 8 hours 05/15/24 1437 05/16/24 0644   05/15/24 1437  valACYclovir  (VALTREX ) tablet 500 mg        500 mg Oral Daily PRN 05/15/24 1437     05/15/24 0600  ceFAZolin  (ANCEF ) IVPB 2g/100 mL premix        2 g 200 mL/hr over 30 Minutes Intravenous On call to O.R. 05/15/24 0559 05/15/24 0805        Patient was given sequential compression devices, early ambulation to prevent DVT.  Patient benefited maximally from hospital stay and there were no complications.    Recent vital signs: Patient Vitals for the past 24 hrs:  BP Temp Temp src Pulse Resp SpO2  05/20/24 1156 123/66 98.5 F (36.9 C) Oral 82 16 96 %  05/20/24 0713 110/63 98 F (36.7 C) Oral 74 18 98 %  05/20/24 0348 (!) 106/45 98.1 F (36.7 C) Oral 70 18 96 %  05/20/24 0016 105/72 97.9 F (36.6 C) Oral 77 18 99 %  05/19/24 1900 (!) 113/46 98.5 F (36.9 C) Oral 80 18 92 %     Discharge Medications:   Allergies as of 05/20/2024       Reactions   Shellfish Allergy Hives, Anaphylaxis   Hydrocodone -acetaminophen  Itching, Other (See Comments)   confusion Mental Status Changes   Penicillins Swelling, Other (See Comments)   Gabapentin     Not available   Latex Rash   Sulfa Antibiotics Diarrhea, Itching, Rash        Medication List     TAKE these medications    Accu-Chek Guide Me w/Device Kit daily.   Accu-Chek Guide test strip Generic drug: glucose blood   Accu-Chek Softclix Lancets lancets SMARTSIG:Topical   albuterol  108 (90 Base) MCG/ACT inhaler Commonly  known as: VENTOLIN  HFA Inhale 2 puffs into the lungs every 6 (six) hours as needed for wheezing or shortness of breath.   albuterol  108 (90 Base) MCG/ACT inhaler Commonly known as: VENTOLIN  HFA Inhale 1-2 puffs into the lungs every 6 (six) hours as needed for wheezing or shortness of breath.   atorvastatin  40 MG tablet Commonly known as: LIPITOR Take 40 mg by mouth at bedtime.   Breo Ellipta  100-25 MCG/ACT Aepb Generic drug: fluticasone  furoate-vilanterol TAKE 1 PUFF BY MOUTH EVERY DAY What changed: See the new instructions.   busPIRone  30 MG tablet Commonly known as: BUSPAR  Take 30 mg by mouth 2 (two) times daily.   carvedilol  3.125 MG tablet Commonly known as: COREG  Take 1 tablet (3.125 mg total) by mouth 2 (two) times daily.   cyclobenzaprine  10 MG tablet Commonly known as: FLEXERIL  Take 1 tablet (10 mg total) by mouth 2 (two) times daily as needed for muscle spasms.   EPINEPHrine  0.3 mg/0.3 mL Soaj injection Commonly known as: EPI-PEN Inject 0.3 mg into the muscle as needed for anaphylaxis. Follow package instructions as needed for severe allergy or anaphylactic reaction.   furosemide  40 MG tablet Commonly known as: LASIX  Take 1 tablet (40 mg total) by mouth daily as needed for edema or fluid (take in case of weight gain 2 to 3 lbs in 24 hrs or 5 lbs in 7 days.).   hydrOXYzine  25 MG capsule Commonly known as: VISTARIL  Take 25 mg by mouth 3 (three) times daily as needed for itching.   ipratropium-albuterol  0.5-2.5 (3) MG/3ML Soln Commonly known as: DUONEB Take 3 mLs by nebulization every 6 (six) hours as needed (shortness of breath).   Jardiance  10 MG Tabs tablet Generic drug: empagliflozin  Take 1 tablet (10 mg total) by mouth daily.   Klor-Con  M10 10 MEQ tablet Generic drug: potassium chloride  Take 10 mEq by mouth 2 (two) times daily.   lidocaine  5 % Commonly known as: LIDODERM  Place 1 patch onto the skin daily as needed (pain).   loperamide  2 MG  capsule Commonly known as: IMODIUM  Take 1 capsule (2 mg total) by mouth 4 (four) times daily as needed for diarrhea or loose stools.   losartan  25 MG tablet Commonly known as: COZAAR  Take 1 tablet (25 mg total) by mouth daily.   metFORMIN  500 MG 24 hr tablet Commonly known as: GLUCOPHAGE -XR Take 1 tablet (500  mg total) by mouth daily with breakfast.   methocarbamol  500 MG tablet Commonly known as: ROBAXIN  Take 1 tablet (500 mg total) by mouth every 6 (six) hours as needed for muscle spasms.   mupirocin ointment 2 % Commonly known as: BACTROBAN Apply 1 Application topically 2 (two) times daily as needed (irritation).   nitroGLYCERIN  0.3 MG SL tablet Commonly known as: NITROSTAT  Place 0.3 mg under the tongue every 5 (five) minutes as needed for chest pain.   NON FORMULARY Pt uses a cpap nightly   nystatin  powder Commonly known as: MYCOSTATIN /NYSTOP  Apply 1 Application topically 3 (three) times daily. What changed:  when to take this reasons to take this   ondansetron  4 MG disintegrating tablet Commonly known as: ZOFRAN -ODT Take 1 tablet (4 mg total) by mouth every 8 (eight) hours as needed for nausea or vomiting.   oxyCODONE -acetaminophen  5-325 MG tablet Commonly known as: PERCOCET/ROXICET Take 1-2 tablets by mouth every 4 (four) hours as needed for severe pain (pain score 7-10) or moderate pain (pain score 4-6).   pantoprazole  40 MG tablet Commonly known as: PROTONIX  Take 40 mg by mouth daily.   Semaglutide  14 MG Tabs Take 14 mg by mouth daily.   sertraline  25 MG tablet Commonly known as: ZOLOFT  Take 1 tablet (25 mg total) by mouth daily.   spironolactone  25 MG tablet Commonly known as: ALDACTONE  Take 25 mg by mouth daily.   terbinafine  250 MG tablet Commonly known as: LAMISIL  TAKE 1 TABLET BY MOUTH EVERY DAY   triamcinolone  ointment 0.1 % Commonly known as: KENALOG  Apply 1 Application topically daily as needed (irritation).   valACYclovir  500 MG  tablet Commonly known as: VALTREX  Take 500 mg by mouth daily as needed (fever blisters).   Vitamin D  (Ergocalciferol ) 1.25 MG (50000 UNIT) Caps capsule Commonly known as: DRISDOL  Take 1 capsule (50,000 Units total) by mouth every 7 (seven) days.        Diagnostic Studies: DG Lumbar Spine 2-3 Views Result Date: 05/15/2024 CLINICAL DATA:  Elective surgery. EXAM: LUMBAR SPINE - 2-3 VIEW COMPARISON:  Radiograph 04/29/2021 FINDINGS: Two fluoroscopic spot views of the lumbar spine submitted from the operating room. Posterior rod and pedicle screw fixation with interbody spacer at L4-L5. Fluoroscopy time 36 seconds. Dose 30.54 mGy. IMPRESSION: Intraoperative fluoroscopy during lumbar fusion. Electronically Signed   By: Andrea Gasman M.D.   On: 05/15/2024 11:26   DG Lumbar Spine 1 View Result Date: 05/15/2024 CLINICAL DATA:  Elective surgery EXAM: LUMBAR SPINE - 1 VIEW COMPARISON:  Radiograph 04/29/2021 FINDINGS: Portable cross-table lateral spot view of the lumbar spine submitted from the operating room. Surgical instruments localize posteriorly at the L3 and L5-S1 levels. IMPRESSION: Intraoperative localization for lumbar spine surgery. Electronically Signed   By: Andrea Gasman M.D.   On: 05/15/2024 11:25   DG C-Arm 1-60 Min-No Report Result Date: 05/15/2024 Fluoroscopy was utilized by the requesting physician.  No radiographic interpretation.   DG C-Arm 1-60 Min-No Report Result Date: 05/15/2024 Fluoroscopy was utilized by the requesting physician.  No radiographic interpretation.    Disposition: Discharge disposition: 03-Skilled Nursing Facility       PO Day 5 S/P L4-5 decompressoin and fusion by Dr Beuford, doing well, resolved leg pain expected PO LBP. Slow progress in PT, pt has no help at home and has been awaiting a SNF bed, approved and bed provided today. Clear to D/C to SNF   -Scripts for pain printed and in chart (Oxycodone  and robaxin ) -D/C instructions sheet printed  and in chart -D/C today to SNF -Encouraged pt to be as active as possible within her restrictions of no BLT's. Walk frequently and change positions frequently. TLSO brace at all times when upright -Can remove bulky outter dressing today, no need to re-cover steri-strips, shower normally over steri strips -F/U in office 2 weeks PO  Signed: Lorielle Boehning J Jenna Routzahn 05/20/2024, 2:26 PM       "

## 2024-05-20 NOTE — Plan of Care (Signed)
 " Problem: Education: Goal: Knowledge of General Education information will improve Description: Including pain rating scale, medication(s)/side effects and non-pharmacologic comfort measures Outcome: Progressing   Problem: Health Behavior/Discharge Planning: Goal: Ability to manage health-related needs will improve Outcome: Progressing   Problem: Clinical Measurements: Goal: Ability to maintain clinical measurements within normal limits will improve Outcome: Progressing Goal: Will remain free from infection Outcome: Progressing Goal: Diagnostic test results will improve Outcome: Progressing Goal: Respiratory complications will improve Outcome: Progressing Goal: Cardiovascular complication will be avoided Outcome: Progressing   Problem: Activity: Goal: Risk for activity intolerance will decrease Outcome: Progressing   Problem: Nutrition: Goal: Adequate nutrition will be maintained Outcome: Progressing   Problem: Coping: Goal: Level of anxiety will decrease Outcome: Progressing   Problem: Elimination: Goal: Will not experience complications related to bowel motility Outcome: Progressing Goal: Will not experience complications related to urinary retention Outcome: Progressing   Problem: Pain Managment: Goal: General experience of comfort will improve and/or be controlled Outcome: Progressing   Problem: Safety: Goal: Ability to remain free from injury will improve Outcome: Progressing   Problem: Skin Integrity: Goal: Risk for impaired skin integrity will decrease Outcome: Progressing   Problem: Education: Goal: Knowledge of the prescribed therapeutic regimen will improve Outcome: Progressing   Problem: Bowel/Gastric: Goal: Gastrointestinal status for postoperative course will improve Outcome: Progressing   Problem: Cardiac: Goal: Ability to maintain an adequate cardiac output Outcome: Progressing Goal: Will show no evidence of cardiac arrhythmias Outcome:  Progressing   Problem: Nutritional: Goal: Will attain and maintain optimal nutritional status Outcome: Progressing   Problem: Neurological: Goal: Will regain or maintain usual level of consciousness Outcome: Progressing   Problem: Clinical Measurements: Goal: Ability to maintain clinical measurements within normal limits Outcome: Progressing Goal: Postoperative complications will be avoided or minimized Outcome: Progressing   Problem: Respiratory: Goal: Will regain and/or maintain adequate ventilation Outcome: Progressing Goal: Respiratory status will improve Outcome: Progressing   Problem: Skin Integrity: Goal: Demonstrates signs of wound healing without infection Outcome: Progressing   Problem: Urinary Elimination: Goal: Will remain free from infection Outcome: Progressing Goal: Ability to achieve and maintain adequate urine output Outcome: Progressing   Problem: Education: Goal: Ability to verbalize activity precautions or restrictions will improve Outcome: Progressing Goal: Knowledge of the prescribed therapeutic regimen will improve Outcome: Progressing Goal: Understanding of discharge needs will improve Outcome: Progressing   Problem: Education: Goal: Knowledge of General Education information will improve Description: Including pain rating scale, medication(s)/side effects and non-pharmacologic comfort measures Outcome: Progressing   Problem: Health Behavior/Discharge Planning: Goal: Ability to manage health-related needs will improve Outcome: Progressing   Problem: Clinical Measurements: Goal: Ability to maintain clinical measurements within normal limits will improve Outcome: Progressing Goal: Will remain free from infection Outcome: Progressing Goal: Diagnostic test results will improve Outcome: Progressing Goal: Respiratory complications will improve Outcome: Progressing Goal: Cardiovascular complication will be avoided Outcome: Progressing    Problem: Activity: Goal: Risk for activity intolerance will decrease Outcome: Progressing   Problem: Nutrition: Goal: Adequate nutrition will be maintained Outcome: Progressing   Problem: Coping: Goal: Level of anxiety will decrease Outcome: Progressing   Problem: Elimination: Goal: Will not experience complications related to bowel motility Outcome: Progressing Goal: Will not experience complications related to urinary retention Outcome: Progressing   Problem: Pain Managment: Goal: General experience of comfort will improve and/or be controlled Outcome: Progressing   Problem: Safety: Goal: Ability to remain free from injury will improve Outcome: Progressing   Problem: Skin Integrity: Goal: Risk for impaired skin integrity  will decrease Outcome: Progressing   "

## 2024-05-20 NOTE — Plan of Care (Signed)
 " Problem: Education: Goal: Knowledge of General Education information will improve Description: Including pain rating scale, medication(s)/side effects and non-pharmacologic comfort measures 05/20/2024 1642 by Charlott Pott, RN Outcome: Completed/Met 05/20/2024 1641 by Charlott Pott, RN Outcome: Progressing   Problem: Health Behavior/Discharge Planning: Goal: Ability to manage health-related needs will improve 05/20/2024 1642 by Charlott Pott, RN Outcome: Completed/Met 05/20/2024 1641 by Charlott Pott, RN Outcome: Progressing   Problem: Clinical Measurements: Goal: Ability to maintain clinical measurements within normal limits will improve 05/20/2024 1642 by Charlott Pott, RN Outcome: Completed/Met 05/20/2024 1641 by Charlott Pott, RN Outcome: Progressing Goal: Will remain free from infection 05/20/2024 1642 by Charlott Pott, RN Outcome: Completed/Met 05/20/2024 1641 by Charlott Pott, RN Outcome: Progressing Goal: Diagnostic test results will improve 05/20/2024 1642 by Charlott Pott, RN Outcome: Completed/Met 05/20/2024 1641 by Charlott Pott, RN Outcome: Progressing Goal: Respiratory complications will improve 05/20/2024 1642 by Charlott Pott, RN Outcome: Completed/Met 05/20/2024 1641 by Charlott Pott, RN Outcome: Progressing Goal: Cardiovascular complication will be avoided 05/20/2024 1642 by Charlott Pott, RN Outcome: Completed/Met 05/20/2024 1641 by Charlott Pott, RN Outcome: Progressing   Problem: Activity: Goal: Risk for activity intolerance will decrease 05/20/2024 1642 by Charlott Pott, RN Outcome: Completed/Met 05/20/2024 1641 by Charlott Pott, RN Outcome: Progressing   Problem: Nutrition: Goal: Adequate nutrition will be maintained 05/20/2024 1642 by Charlott Pott, RN Outcome: Completed/Met 05/20/2024 1641 by Charlott Pott, RN Outcome: Progressing   Problem:  Coping: Goal: Level of anxiety will decrease 05/20/2024 1642 by Charlott Pott, RN Outcome: Completed/Met 05/20/2024 1641 by Charlott Pott, RN Outcome: Progressing   Problem: Elimination: Goal: Will not experience complications related to bowel motility 05/20/2024 1642 by Charlott Pott, RN Outcome: Completed/Met 05/20/2024 1641 by Charlott Pott, RN Outcome: Progressing Goal: Will not experience complications related to urinary retention 05/20/2024 1642 by Charlott Pott, RN Outcome: Completed/Met 05/20/2024 1641 by Charlott Pott, RN Outcome: Progressing   Problem: Pain Managment: Goal: General experience of comfort will improve and/or be controlled 05/20/2024 1642 by Charlott Pott, RN Outcome: Completed/Met 05/20/2024 1641 by Charlott Pott, RN Outcome: Progressing   Problem: Safety: Goal: Ability to remain free from injury will improve 05/20/2024 1642 by Charlott Pott, RN Outcome: Completed/Met 05/20/2024 1641 by Charlott Pott, RN Outcome: Progressing   Problem: Skin Integrity: Goal: Risk for impaired skin integrity will decrease 05/20/2024 1642 by Charlott Pott, RN Outcome: Completed/Met 05/20/2024 1641 by Charlott Pott, RN Outcome: Progressing   Problem: Education: Goal: Knowledge of the prescribed therapeutic regimen will improve 05/20/2024 1642 by Charlott Pott, RN Outcome: Completed/Met 05/20/2024 1641 by Charlott Pott, RN Outcome: Progressing   Problem: Bowel/Gastric: Goal: Gastrointestinal status for postoperative course will improve 05/20/2024 1642 by Charlott Pott, RN Outcome: Completed/Met 05/20/2024 1641 by Charlott Pott, RN Outcome: Progressing   Problem: Cardiac: Goal: Ability to maintain an adequate cardiac output 05/20/2024 1642 by Charlott Pott, RN Outcome: Completed/Met 05/20/2024 1641 by Charlott Pott, RN Outcome: Progressing Goal: Will show no evidence  of cardiac arrhythmias 05/20/2024 1642 by Charlott Pott, RN Outcome: Completed/Met 05/20/2024 1641 by Charlott Pott, RN Outcome: Progressing   Problem: Nutritional: Goal: Will attain and maintain optimal nutritional status 05/20/2024 1642 by Charlott Pott, RN Outcome: Completed/Met 05/20/2024 1641 by Charlott Pott, RN Outcome: Progressing   Problem: Neurological: Goal: Will regain or maintain usual level of consciousness 05/20/2024 1642 by Charlott Pott, RN Outcome: Completed/Met 05/20/2024 1641 by Charlott Pott, RN Outcome: Progressing   Problem: Clinical Measurements: Goal: Ability to maintain clinical measurements within normal limits 05/20/2024 1642 by Charlott Pott, RN Outcome: Completed/Met 05/20/2024  1641 by Charlott Pott, RN Outcome: Progressing Goal: Postoperative complications will be avoided or minimized 05/20/2024 1642 by Charlott Pott, RN Outcome: Completed/Met 05/20/2024 1641 by Charlott Pott, RN Outcome: Progressing   Problem: Respiratory: Goal: Will regain and/or maintain adequate ventilation 05/20/2024 1642 by Charlott Pott, RN Outcome: Completed/Met 05/20/2024 1641 by Charlott Pott, RN Outcome: Progressing Goal: Respiratory status will improve 05/20/2024 1642 by Charlott Pott, RN Outcome: Completed/Met 05/20/2024 1641 by Charlott Pott, RN Outcome: Progressing   Problem: Skin Integrity: Goal: Demonstrates signs of wound healing without infection 05/20/2024 1642 by Charlott Pott, RN Outcome: Completed/Met 05/20/2024 1641 by Charlott Pott, RN Outcome: Progressing   Problem: Urinary Elimination: Goal: Will remain free from infection 05/20/2024 1642 by Charlott Pott, RN Outcome: Completed/Met 05/20/2024 1641 by Charlott Pott, RN Outcome: Progressing Goal: Ability to achieve and maintain adequate urine output 05/20/2024 1642 by Charlott Pott,  RN Outcome: Completed/Met 05/20/2024 1641 by Charlott Pott, RN Outcome: Progressing   Problem: Education: Goal: Ability to verbalize activity precautions or restrictions will improve 05/20/2024 1642 by Charlott Pott, RN Outcome: Completed/Met 05/20/2024 1641 by Charlott Pott, RN Outcome: Progressing Goal: Knowledge of the prescribed therapeutic regimen will improve 05/20/2024 1642 by Charlott Pott, RN Outcome: Completed/Met 05/20/2024 1641 by Charlott Pott, RN Outcome: Progressing Goal: Understanding of discharge needs will improve 05/20/2024 1642 by Charlott Pott, RN Outcome: Completed/Met 05/20/2024 1641 by Charlott Pott, RN Outcome: Progressing   Problem: Education: Goal: Knowledge of General Education information will improve Description: Including pain rating scale, medication(s)/side effects and non-pharmacologic comfort measures 05/20/2024 1642 by Charlott Pott, RN Outcome: Completed/Met 05/20/2024 1641 by Charlott Pott, RN Outcome: Progressing   Problem: Health Behavior/Discharge Planning: Goal: Ability to manage health-related needs will improve 05/20/2024 1642 by Charlott Pott, RN Outcome: Completed/Met 05/20/2024 1641 by Charlott Pott, RN Outcome: Progressing   Problem: Clinical Measurements: Goal: Ability to maintain clinical measurements within normal limits will improve 05/20/2024 1642 by Charlott Pott, RN Outcome: Completed/Met 05/20/2024 1641 by Charlott Pott, RN Outcome: Progressing Goal: Will remain free from infection 05/20/2024 1642 by Charlott Pott, RN Outcome: Completed/Met 05/20/2024 1641 by Charlott Pott, RN Outcome: Progressing Goal: Diagnostic test results will improve 05/20/2024 1642 by Charlott Pott, RN Outcome: Completed/Met 05/20/2024 1641 by Charlott Pott, RN Outcome: Progressing Goal: Respiratory complications will improve 05/20/2024 1642 by  Charlott Pott, RN Outcome: Completed/Met 05/20/2024 1641 by Charlott Pott, RN Outcome: Progressing Goal: Cardiovascular complication will be avoided 05/20/2024 1642 by Charlott Pott, RN Outcome: Completed/Met 05/20/2024 1641 by Charlott Pott, RN Outcome: Progressing   Problem: Activity: Goal: Risk for activity intolerance will decrease 05/20/2024 1642 by Charlott Pott, RN Outcome: Completed/Met 05/20/2024 1641 by Charlott Pott, RN Outcome: Progressing   Problem: Nutrition: Goal: Adequate nutrition will be maintained 05/20/2024 1642 by Charlott Pott, RN Outcome: Completed/Met 05/20/2024 1641 by Charlott Pott, RN Outcome: Progressing   Problem: Coping: Goal: Level of anxiety will decrease 05/20/2024 1642 by Charlott Pott, RN Outcome: Completed/Met 05/20/2024 1641 by Charlott Pott, RN Outcome: Progressing   Problem: Elimination: Goal: Will not experience complications related to bowel motility 05/20/2024 1642 by Charlott Pott, RN Outcome: Completed/Met 05/20/2024 1641 by Charlott Pott, RN Outcome: Progressing Goal: Will not experience complications related to urinary retention 05/20/2024 1642 by Charlott Pott, RN Outcome: Completed/Met 05/20/2024 1641 by Charlott Pott, RN Outcome: Progressing   Problem: Pain Managment: Goal: General experience of comfort will improve and/or be controlled 05/20/2024 1642 by Charlott Pott, RN Outcome: Completed/Met 05/20/2024 1641 by Charlott Pott, RN Outcome: Progressing   Problem: Safety: Goal: Ability to remain  free from injury will improve 05/20/2024 1642 by Charlott Pott, RN Outcome: Completed/Met 05/20/2024 1641 by Charlott Pott, RN Outcome: Progressing   Problem: Skin Integrity: Goal: Risk for impaired skin integrity will decrease 05/20/2024 1642 by Charlott Pott, RN Outcome: Completed/Met 05/20/2024 1641 by Charlott Pott, RN Outcome: Progressing   "

## 2024-05-20 NOTE — Progress Notes (Signed)
 Physical Therapy Treatment Patient Details Name: [name] MRN: [id number] DOB: [birth year] Today's Date: 05/20/2024   History of Present Illness 64 yo F adm 1/22 TLIF L4-5 PMH arthritis, anxiety, CHF, clotting disorder, CAD, depression, DM, HLD, HTN, IBS, mediastinal mass, PE, RA, sleep apnea on CPAP,    PT Comments  Pt limited today by Low back spasms radiating down bilat LEs and dizziness. Pt reports having nausea as well. Pt requiring seated rest break s/p 30' of amb with RW. Pt given LE exercises to complete in chair as HEP. D/c recommendations remain appropriate. Acute PT to cont to follow.    If plan is discharge home, recommend the following: A little help with walking and/or transfers;A little help with bathing/dressing/bathroom;Assistance with cooking/housework;Help with stairs or ramp for entrance   Can travel by private vehicle        Equipment Recommendations  Rolling walker (2 wheels);BSC/3in1    Recommendations for Other Services       Precautions / Restrictions Precautions Precautions: Back Precaution Booklet Issued: Yes (comment) Recall of Precautions/Restrictions: Intact Required Braces or Orthoses: Spinal Brace Spinal Brace: Thoracolumbosacral orthotic;Applied in sitting position Restrictions Weight Bearing Restrictions Per Provider Order: No Other Position/Activity Restrictions: pt. able to state 3/3 back precuations     Mobility  Bed Mobility               General bed mobility comments: pt sitting up in chair upon PT arrival    Transfers Overall transfer level: Needs assistance Equipment used: Rolling walker (2 wheels)               General transfer comment: cues for hand placement, pushing through arms vs pulling on walker. cues for backing up to chair and reaching for arm rests prior to sitting down, increased time, completed 3 STS and required increased time due to onset of spasms    Ambulation/Gait Ambulation/Gait  assistance: Contact guard assist Gait Distance (Feet): 30 Feet (x1, 20x1) Assistive device: Rolling walker (2 wheels) Gait Pattern/deviations: Step-to pattern Gait velocity: reduced Gait velocity interpretation: <1.31 ft/sec, indicative of household ambulator   General Gait Details: slowed step-to gait, 1 standing rest break and 1 seated rest break due to onset of dizziness and worsening low back spasms   Stairs             Wheelchair Mobility     Tilt Bed    Modified Rankin (Stroke Patients Only)       Balance Overall balance assessment: Needs assistance Sitting-balance support: No upper extremity supported, Feet supported Sitting balance-Leahy Scale: Fair     Standing balance support: Bilateral upper extremity supported, Reliant on assistive device for balance Standing balance-Leahy Scale: Poor Standing balance comment: requires use of RW                            Communication Communication Communication: No apparent difficulties  Cognition Arousal: Alert Behavior During Therapy: WFL for tasks assessed/performed   PT - Cognitive impairments: No apparent impairments                         Following commands: Intact      Cueing Cueing Techniques: Verbal cues  Exercises General Exercises - Lower Extremity Ankle Circles/Pumps: AROM, Both, 10 reps, Seated Gluteal Sets: AROM, Both, 10 reps, Seated Long Arc Quad: AROM, Both, 10 reps, Seated    General Comments General comments (skin integrity, edema, etc.):  VSS on RA      Pertinent Vitals/Pain Pain Assessment Pain Assessment: 0-10 Pain Score: 9  Pain Location: low back radiating down bilat LEs Pain Descriptors / Indicators: Spasm Pain Intervention(s): Limited activity within patient's tolerance    Home Living                          Prior Function            PT Goals (current goals can now be found in the care plan section) Acute Rehab PT Goals Patient Stated  Goal: able to be independent before I get home PT Goal Formulation: With patient Time For Goal Achievement: 05/30/24 Potential to Achieve Goals: Good Progress towards PT goals: Progressing toward goals    Frequency    Min 2X/week      PT Plan      Co-evaluation              AM-PAC PT 6 Clicks Mobility   Outcome Measure  Help needed turning from your back to your side while in a flat bed without using bedrails?: A Little Help needed moving from lying on your back to sitting on the side of a flat bed without using bedrails?: A Lot Help needed moving to and from a bed to a chair (including a wheelchair)?: A Little Help needed standing up from a chair using your arms (e.g., wheelchair or bedside chair)?: A Little Help needed to walk in hospital room?: A Little Help needed climbing 3-5 steps with a railing? : Total 6 Click Score: 15    End of Session Equipment Utilized During Treatment: Gait belt;Back brace Activity Tolerance: Patient limited by fatigue Patient left: in chair;with call bell/phone within reach;with chair alarm set Nurse Communication: Mobility status PT Visit Diagnosis: Other abnormalities of gait and mobility (R26.89);Difficulty in walking, not elsewhere classified (R26.2);Pain     Time: 8875-8856 PT Time Calculation (min) (ACUTE ONLY): 19 min  Charges:    $Gait Training: 8-22 mins PT General Charges $$ ACUTE PT VISIT: 1 Visit                     Norene Ames, PT, DPT Acute Rehabilitation Services Secure chat preferred Office #: [phone]    Norene [name] Ames 05/20/2024, 2:22 PM

## 2024-05-20 NOTE — Plan of Care (Signed)

## 2024-05-21 ENCOUNTER — Other Ambulatory Visit (HOSPITAL_COMMUNITY): Payer: Self-pay

## 2024-05-24 ENCOUNTER — Other Ambulatory Visit: Payer: Self-pay | Admitting: Nurse Practitioner

## 2024-06-23 ENCOUNTER — Ambulatory Visit: Admitting: Cardiovascular Disease

## 2024-07-02 ENCOUNTER — Ambulatory Visit (INDEPENDENT_AMBULATORY_CARE_PROVIDER_SITE_OTHER): Admitting: Adult Health

## 2024-08-25 ENCOUNTER — Ambulatory Visit

## 2025-01-22 ENCOUNTER — Ambulatory Visit: Admitting: Internal Medicine

## 2025-04-02 ENCOUNTER — Ambulatory Visit: Admitting: Adult Health
